# Patient Record
Sex: Female | Born: 1950 | Race: White | Hispanic: No | Marital: Married | Smoking: Former smoker
Health system: Southern US, Community
[De-identification: ages and names within clinical notes are randomized; demographics above are authoritative.]

## PROBLEM LIST (undated history)

## (undated) DIAGNOSIS — D649 Anemia, unspecified: Secondary | ICD-10-CM

## (undated) DIAGNOSIS — E039 Hypothyroidism, unspecified: Secondary | ICD-10-CM

## (undated) DIAGNOSIS — F32A Depression, unspecified: Secondary | ICD-10-CM

## (undated) DIAGNOSIS — M1990 Unspecified osteoarthritis, unspecified site: Secondary | ICD-10-CM

## (undated) DIAGNOSIS — R51 Headache: Secondary | ICD-10-CM

## (undated) DIAGNOSIS — R000 Tachycardia, unspecified: Secondary | ICD-10-CM

## (undated) DIAGNOSIS — E669 Obesity, unspecified: Secondary | ICD-10-CM

## (undated) DIAGNOSIS — I872 Venous insufficiency (chronic) (peripheral): Secondary | ICD-10-CM

## (undated) DIAGNOSIS — K219 Gastro-esophageal reflux disease without esophagitis: Secondary | ICD-10-CM

## (undated) DIAGNOSIS — F329 Major depressive disorder, single episode, unspecified: Secondary | ICD-10-CM

## (undated) DIAGNOSIS — I10 Essential (primary) hypertension: Secondary | ICD-10-CM

## (undated) DIAGNOSIS — E785 Hyperlipidemia, unspecified: Secondary | ICD-10-CM

## (undated) DIAGNOSIS — I480 Paroxysmal atrial fibrillation: Secondary | ICD-10-CM

## (undated) DIAGNOSIS — I5032 Chronic diastolic (congestive) heart failure: Secondary | ICD-10-CM

## (undated) DIAGNOSIS — I639 Cerebral infarction, unspecified: Secondary | ICD-10-CM

## (undated) DIAGNOSIS — T7840XA Allergy, unspecified, initial encounter: Secondary | ICD-10-CM

## (undated) DIAGNOSIS — I779 Disorder of arteries and arterioles, unspecified: Secondary | ICD-10-CM

## (undated) DIAGNOSIS — R112 Nausea with vomiting, unspecified: Secondary | ICD-10-CM

## (undated) DIAGNOSIS — I509 Heart failure, unspecified: Secondary | ICD-10-CM

## (undated) DIAGNOSIS — I209 Angina pectoris, unspecified: Secondary | ICD-10-CM

## (undated) DIAGNOSIS — G629 Polyneuropathy, unspecified: Secondary | ICD-10-CM

## (undated) DIAGNOSIS — Z87442 Personal history of urinary calculi: Secondary | ICD-10-CM

## (undated) DIAGNOSIS — Z9889 Other specified postprocedural states: Secondary | ICD-10-CM

## (undated) DIAGNOSIS — I2510 Atherosclerotic heart disease of native coronary artery without angina pectoris: Secondary | ICD-10-CM

## (undated) DIAGNOSIS — F419 Anxiety disorder, unspecified: Secondary | ICD-10-CM

## (undated) DIAGNOSIS — R0602 Shortness of breath: Secondary | ICD-10-CM

## (undated) DIAGNOSIS — N179 Acute kidney failure, unspecified: Secondary | ICD-10-CM

## (undated) DIAGNOSIS — R0600 Dyspnea, unspecified: Secondary | ICD-10-CM

## (undated) DIAGNOSIS — D689 Coagulation defect, unspecified: Secondary | ICD-10-CM

## (undated) DIAGNOSIS — Z9289 Personal history of other medical treatment: Secondary | ICD-10-CM

## (undated) DIAGNOSIS — E11319 Type 2 diabetes mellitus with unspecified diabetic retinopathy without macular edema: Secondary | ICD-10-CM

## (undated) DIAGNOSIS — N186 End stage renal disease: Secondary | ICD-10-CM

## (undated) DIAGNOSIS — I219 Acute myocardial infarction, unspecified: Secondary | ICD-10-CM

## (undated) DIAGNOSIS — I739 Peripheral vascular disease, unspecified: Secondary | ICD-10-CM

## (undated) DIAGNOSIS — R32 Unspecified urinary incontinence: Secondary | ICD-10-CM

## (undated) DIAGNOSIS — R55 Syncope and collapse: Secondary | ICD-10-CM

## (undated) DIAGNOSIS — R519 Headache, unspecified: Secondary | ICD-10-CM

## (undated) DIAGNOSIS — E119 Type 2 diabetes mellitus without complications: Secondary | ICD-10-CM

## (undated) DIAGNOSIS — R0902 Hypoxemia: Secondary | ICD-10-CM

## (undated) DIAGNOSIS — I4510 Unspecified right bundle-branch block: Secondary | ICD-10-CM

## (undated) HISTORY — PX: ABDOMINAL HYSTERECTOMY: SHX81

## (undated) HISTORY — DX: Hyperlipidemia, unspecified: E78.5

## (undated) HISTORY — DX: Unspecified urinary incontinence: R32

## (undated) HISTORY — DX: Peripheral vascular disease, unspecified: I73.9

## (undated) HISTORY — DX: Anemia, unspecified: D64.9

## (undated) HISTORY — DX: Allergy, unspecified, initial encounter: T78.40XA

## (undated) HISTORY — PX: EYE SURGERY: SHX253

## (undated) HISTORY — PX: COLONOSCOPY W/ POLYPECTOMY: SHX1380

## (undated) HISTORY — DX: Essential (primary) hypertension: I10

## (undated) HISTORY — PX: CORONARY ANGIOPLASTY WITH STENT PLACEMENT: SHX49

## (undated) HISTORY — DX: Hypoxemia: R09.02

## (undated) HISTORY — PX: CATARACT EXTRACTION, BILATERAL: SHX1313

## (undated) HISTORY — DX: Disorder of arteries and arterioles, unspecified: I77.9

## (undated) HISTORY — DX: Unspecified osteoarthritis, unspecified site: M19.90

## (undated) HISTORY — DX: Coagulation defect, unspecified: D68.9

## (undated) HISTORY — PX: TUBAL LIGATION: SHX77

## (undated) HISTORY — DX: Obesity, unspecified: E66.9

## (undated) HISTORY — DX: Hypothyroidism, unspecified: E03.9

## (undated) HISTORY — DX: Atherosclerotic heart disease of native coronary artery without angina pectoris: I25.10

## (undated) HISTORY — DX: Unspecified right bundle-branch block: I45.10

## (undated) HISTORY — DX: Tachycardia, unspecified: R00.0

---

## 1998-01-10 ENCOUNTER — Other Ambulatory Visit: Admission: RE | Admit: 1998-01-10 | Discharge: 1998-01-10 | Payer: Self-pay | Admitting: Obstetrics & Gynecology

## 1998-02-07 ENCOUNTER — Encounter: Payer: Self-pay | Admitting: Obstetrics & Gynecology

## 1998-02-12 ENCOUNTER — Inpatient Hospital Stay (HOSPITAL_COMMUNITY): Admission: RE | Admit: 1998-02-12 | Discharge: 1998-02-16 | Payer: Self-pay | Admitting: Obstetrics & Gynecology

## 1998-10-23 ENCOUNTER — Encounter: Payer: Self-pay | Admitting: Emergency Medicine

## 1998-10-24 ENCOUNTER — Inpatient Hospital Stay (HOSPITAL_COMMUNITY): Admission: EM | Admit: 1998-10-24 | Discharge: 1998-10-27 | Payer: Self-pay | Admitting: *Deleted

## 1999-08-05 ENCOUNTER — Encounter: Admission: RE | Admit: 1999-08-05 | Discharge: 1999-11-03 | Payer: Self-pay | Admitting: Family Medicine

## 1999-11-13 ENCOUNTER — Encounter: Admission: RE | Admit: 1999-11-13 | Discharge: 1999-11-13 | Payer: Self-pay | Admitting: Family Medicine

## 1999-11-13 ENCOUNTER — Encounter: Payer: Self-pay | Admitting: Family Medicine

## 2000-08-18 ENCOUNTER — Emergency Department (HOSPITAL_COMMUNITY): Admission: EM | Admit: 2000-08-18 | Discharge: 2000-08-18 | Payer: Self-pay | Admitting: Emergency Medicine

## 2000-08-18 ENCOUNTER — Encounter: Payer: Self-pay | Admitting: Emergency Medicine

## 2000-09-16 ENCOUNTER — Encounter: Admission: RE | Admit: 2000-09-16 | Discharge: 2000-09-16 | Payer: Self-pay | Admitting: Family Medicine

## 2000-09-16 ENCOUNTER — Encounter: Payer: Self-pay | Admitting: Family Medicine

## 2005-11-24 ENCOUNTER — Ambulatory Visit: Payer: Self-pay | Admitting: Internal Medicine

## 2005-11-24 ENCOUNTER — Inpatient Hospital Stay (HOSPITAL_COMMUNITY): Admission: EM | Admit: 2005-11-24 | Discharge: 2005-11-28 | Payer: Self-pay | Admitting: Emergency Medicine

## 2005-12-10 ENCOUNTER — Ambulatory Visit (HOSPITAL_COMMUNITY): Admission: RE | Admit: 2005-12-10 | Discharge: 2005-12-10 | Payer: Self-pay | Admitting: *Deleted

## 2005-12-10 ENCOUNTER — Ambulatory Visit: Payer: Self-pay | Admitting: *Deleted

## 2005-12-17 ENCOUNTER — Ambulatory Visit: Payer: Self-pay | Admitting: Cardiology

## 2005-12-17 ENCOUNTER — Encounter: Payer: Self-pay | Admitting: Cardiology

## 2005-12-17 ENCOUNTER — Ambulatory Visit: Payer: Self-pay

## 2005-12-31 ENCOUNTER — Ambulatory Visit: Payer: Self-pay | Admitting: Cardiology

## 2006-01-05 ENCOUNTER — Ambulatory Visit: Payer: Self-pay | Admitting: Cardiology

## 2006-01-05 ENCOUNTER — Inpatient Hospital Stay (HOSPITAL_BASED_OUTPATIENT_CLINIC_OR_DEPARTMENT_OTHER): Admission: RE | Admit: 2006-01-05 | Discharge: 2006-01-05 | Payer: Self-pay | Admitting: Cardiology

## 2006-03-19 ENCOUNTER — Ambulatory Visit: Payer: Self-pay | Admitting: Cardiology

## 2006-04-09 ENCOUNTER — Ambulatory Visit: Payer: Self-pay | Admitting: Cardiology

## 2006-04-15 ENCOUNTER — Ambulatory Visit: Payer: Self-pay | Admitting: Cardiology

## 2006-04-21 ENCOUNTER — Ambulatory Visit: Payer: Self-pay | Admitting: Cardiology

## 2006-05-15 ENCOUNTER — Ambulatory Visit: Payer: Self-pay | Admitting: Cardiology

## 2006-07-02 ENCOUNTER — Ambulatory Visit: Payer: Self-pay | Admitting: Cardiology

## 2006-07-23 ENCOUNTER — Encounter (HOSPITAL_COMMUNITY): Admission: RE | Admit: 2006-07-23 | Discharge: 2006-10-21 | Payer: Self-pay | Admitting: Cardiology

## 2006-09-14 ENCOUNTER — Ambulatory Visit: Payer: Self-pay | Admitting: Cardiology

## 2006-09-16 ENCOUNTER — Ambulatory Visit: Payer: Self-pay | Admitting: Cardiology

## 2006-10-01 ENCOUNTER — Ambulatory Visit: Payer: Self-pay | Admitting: Cardiology

## 2006-10-25 HISTORY — PX: KNEE ARTHROSCOPY: SHX127

## 2006-11-30 ENCOUNTER — Ambulatory Visit: Payer: Self-pay | Admitting: Cardiology

## 2007-01-25 LAB — HM PAP SMEAR

## 2007-05-31 ENCOUNTER — Ambulatory Visit: Payer: Self-pay | Admitting: Cardiology

## 2009-01-22 ENCOUNTER — Encounter: Payer: Self-pay | Admitting: Cardiology

## 2009-01-22 DIAGNOSIS — E663 Overweight: Secondary | ICD-10-CM | POA: Insufficient documentation

## 2009-01-22 DIAGNOSIS — I10 Essential (primary) hypertension: Secondary | ICD-10-CM | POA: Insufficient documentation

## 2009-01-22 DIAGNOSIS — R0602 Shortness of breath: Secondary | ICD-10-CM | POA: Insufficient documentation

## 2009-01-22 DIAGNOSIS — E119 Type 2 diabetes mellitus without complications: Secondary | ICD-10-CM | POA: Insufficient documentation

## 2009-01-22 DIAGNOSIS — E785 Hyperlipidemia, unspecified: Secondary | ICD-10-CM | POA: Insufficient documentation

## 2009-01-22 DIAGNOSIS — E8770 Fluid overload, unspecified: Secondary | ICD-10-CM | POA: Insufficient documentation

## 2009-01-24 ENCOUNTER — Ambulatory Visit: Payer: Self-pay | Admitting: Cardiology

## 2009-06-18 ENCOUNTER — Telehealth: Payer: Self-pay | Admitting: Cardiology

## 2009-06-28 ENCOUNTER — Encounter: Payer: Self-pay | Admitting: Cardiology

## 2009-07-12 ENCOUNTER — Encounter: Admission: RE | Admit: 2009-07-12 | Discharge: 2009-07-12 | Payer: Self-pay | Admitting: Obstetrics and Gynecology

## 2009-07-24 LAB — HM COLONOSCOPY

## 2010-01-29 ENCOUNTER — Ambulatory Visit: Payer: Self-pay | Admitting: Cardiology

## 2010-02-18 ENCOUNTER — Ambulatory Visit: Payer: Self-pay | Admitting: Cardiology

## 2010-04-01 ENCOUNTER — Ambulatory Visit: Payer: Self-pay | Admitting: Cardiology

## 2010-05-30 ENCOUNTER — Ambulatory Visit
Admission: RE | Admit: 2010-05-30 | Discharge: 2010-05-30 | Payer: Self-pay | Source: Home / Self Care | Attending: Cardiology | Admitting: Cardiology

## 2010-06-23 LAB — CONVERTED CEMR LAB
Basophils Absolute: 0 10*3/uL (ref 0.0–0.1)
Basophils Relative: 0.1 % (ref 0.0–1.0)
Eosinophils Absolute: 0.3 10*3/uL (ref 0.0–0.6)
Eosinophils Relative: 4 % (ref 0.0–5.0)
HCT: 33.8 % — ABNORMAL LOW (ref 36.0–46.0)
Hemoglobin: 11 g/dL — ABNORMAL LOW (ref 12.0–15.0)
Lymphocytes Relative: 15.4 % (ref 12.0–46.0)
MCHC: 32.7 g/dL (ref 30.0–36.0)
MCV: 68.7 fL — ABNORMAL LOW (ref 78.0–100.0)
Monocytes Absolute: 0.4 10*3/uL (ref 0.2–0.7)
Monocytes Relative: 5.9 % (ref 3.0–11.0)
Neutro Abs: 5.1 10*3/uL (ref 1.4–7.7)
Neutrophils Relative %: 74.6 % (ref 43.0–77.0)
Platelets: 351 10*3/uL (ref 150–400)
RBC: 4.92 M/uL (ref 3.87–5.11)
RDW: 15.8 % — ABNORMAL HIGH (ref 11.5–14.6)
WBC: 6.9 10*3/uL (ref 4.5–10.5)

## 2010-06-25 NOTE — Miscellaneous (Signed)
  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 MI in 2000.Marland KitchenMarland KitchenMi in 2007 treated bare metal stent HYPERTENSION Anemia LV ...good Osteoarthritis

## 2010-06-25 NOTE — Progress Notes (Signed)
Summary: REFILL -- lasix, plavix, benicar   Phone Note Refill Request Message from:  Patient on June 18, 2009 12:12 PM  Refills Requested: Medication #1:  FUROSEMIDE 40 MG TABS two tablets every morning. [facility] [address] [id number]  Initial call taken by: Delsa Sale,  June 18, 2009 12:13 PM    Prescriptions: PLAVIX 75 MG TABS (CLOPIDOGREL BISULFATE) Take one tablet by mouth daily  #30 x 8   Entered by:   Mignon Pine, RMA   Authorized by:   Lorenza Evangelist, MD, [facility]   Signed by:   Mignon Pine, RMA on 06/19/2009   Method used:   [facility]        [facility]  [address] [id number] ([facility]       [address]       [place], [place]  [address]       Ph: [phone]       Fax: [phone]   RxID:   [id number] BENICAR 40 MG TABS (OLMESARTAN MEDOXOMIL) Take one tablet by mouth daily  #30 x 8   Entered by:   Mignon Pine, RMA   Authorized by:   Lorenza Evangelist, MD, [facility]   Signed by:   Mignon Pine, RMA on 06/19/2009   Method used:   [facility]        [facility]  [address] [id number] ([facility]ail[facility]       [address]       [place], [place]  [address]       Ph: [phone]       Fax: [phone]   RxID:   [id number] FUROSEMIDE 40 MG TABS (FUROSEMIDE) two tablets every morning  #60 x 8   Entered by:   Mignon Pine, RMA   Authorized by:   Lorenza Evangelist, MD, [facility]   Signed by:   Mignon Pine, RMA on 06/19/2009   Method used:   [facility]        [facility]  [address] [id number] ([facility]       [address]       [place], [place]  [address]       Ph: [phone]       Fax: [phone]   [place]:   [phone]

## 2010-06-25 NOTE — Assessment & Plan Note (Signed)
Summary: f2w per check out on 9/6/lg  Medications Added AMITRIPTYLINE HCL 25 MG TABS (AMITRIPTYLINE HCL) at bedtime      Allergies Added:   Visit Type:  Follow-up Primary Provider:  [name], PA-C  CC:  shortness of breath.  History of Present Illness: Patient returns for followup of shortness of breath.  I saw her on January 29, 2010.  She was fluid overloaded.  She was drinking significant excess of fluid.  I have been able to get her to cut back.  She has lost 10 pounds since the last visit.  She is not convinced that it is all fluid but there is definitely a significant fluid component.  In addition I now realize that on certain days she does not take her diuretic if she is worried about excess urination when she is away from home.  I have advised her that she should take her Lasix when she gets home on those days.  Current Medications (verified): 1)  Carvedilol 25 Mg Tabs (Carvedilol) .... Take One Tablet By Mouth Twice A Day 2)  Isosorbide Dinitrate 10 Mg Tabs (Isosorbide Dinitrate) .... Take One Capsule By Mouth Two  Times A Day 3)  Plavix 75 Mg Tabs (Clopidogrel Bisulfate) .... Take One Tablet By Mouth Daily 4)  Benicar 40 Mg Tabs (Olmesartan Medoxomil) .... Take One Tablet By Mouth Daily 5)  Lyrica 100 Mg Caps (Pregabalin) .... Take One Capsule Three Times A Day 6)  Lipitor 80 Mg Tabs (Atorvastatin Calcium) .... Once Daily 7)  Humulin 70/30 70-30 % Susp (Insulin Isophane & Regular) .... As Directed 8)  Aspirin 81 Mg Tabs (Aspirin) .... Once Daily 9)  Furosemide 40 Mg Tabs (Furosemide) .... Two Tablets Every Morning 10)  Metformin Hcl 500 Mg Tabs (Metformin Hcl) .... Two Times A Day 11)  Amitriptyline Hcl 25 Mg Tabs (Amitriptyline Hcl) .... At Bedtime  Allergies (verified): 1)  ! Codeine  Past History:  Past Medical History:  SINUS TACHYCARDIA (ICD-427.89).Marland Kitchen FLUID OVERLOAD (ICD-276.6) OVERWEIGHT (ICD-278.02) DM (ICD-250.00) DYSLIPIDEMIA (ICD-272.4) SHORTNESS OF  BREATH (ICD-786.05) CAD (ICD-414.00)... MI  in 2000.  /   ..MI.... 2007 treated bare metal stent  ( no nuclear since then as  September, 2011) HYPERTENSION Anemia LV ...good...echo.... 2007 Osteoarthritis  Review of Systems       Patient denies fever, chills, headache, sweats, rash, change in vision, change in hearing, chest pain, cough, nausea vomiting, urinary symptoms.  All other systems are reviewed and are negative.  Vital Signs:  Patient profile:   60 year old female Height:      66 inches Weight:      258 pounds BMI:     41.79 Pulse rate:   65 / minute BP sitting:   118 / 76  (left arm) Cuff size:   large  Vitals Entered By: Mignon Pine, RMA (February 18, 2010 2:26 PM)  Physical Exam  General:  patient looks better today. Eyes:  no xanthelasma. Neck:  no jugular distention. Lungs:  lungs are clear.  Respiratory effort is nonlabored. Heart:  cardiac exam reveals S1-S2.  No clicks or significant murmurs or Abdomen:  abdomen is soft. Extremities:  there is 1+ peripheral edema today. Psych:  the patient is oriented to person time and place.  Affect is normal.   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 Take one tablet by mouth  twice a day    Isosorbide Dinitrate 10 Mg Tabs (Isosorbide dinitrate) .Marland Kitchen... Take one capsule by mouth two  times a day    Plavix 75 Mg Tabs (Clopidogrel bisulfate) .Marland Kitchen... Take one tablet by mouth daily    Aspirin 81 Mg Tabs (Aspirin) ..... Once daily Rate is controlled today.  No change in therapy.  Problem # 2:  FLUID OVERLOAD (ICD-276.6) We are making progress but there is still more diuresis necessary.  Patient will continue to keep her fluid intake decreased.  She will take an extra 80 mg of Lasix today and she gets home.  In addition she will longer miss days of not taking her diuretic.  If she goes out  in the morning she'll take the diuretic later in the day.  Problem # 3:  SHORTNESS OF BREATH (ICD-786.05)  Her updated medication list for this problem includes:    Carvedilol 25 Mg Tabs (Carvedilol) .Marland Kitchen... Take one tablet by mouth twice a day    Benicar 40 Mg Tabs (Olmesartan medoxomil) .Marland Kitchen... Take one tablet by mouth daily    Aspirin 81 Mg Tabs (Aspirin) ..... Once daily    Furosemide 40 Mg Tabs (Furosemide) .Marland Kitchen..Marland Kitchen Two tablets every morning Am hopeful that with continued diuresis her shortness of breath will improve.  If not we will look further into whether or not she is having ongoing ischemia.  Patient Instructions: 1)  Please make sure to take your furosemide every day 2)  Follow up in 6 weeks

## 2010-06-25 NOTE — Assessment & Plan Note (Signed)
Summary: per check out/sf  Medications Added ISOSORBIDE DINITRATE 10 MG TABS (ISOSORBIDE DINITRATE) Take one capsule by mouth two  times a day ON HOLD BENICAR 40 MG TABS (OLMESARTAN MEDOXOMIL) 1/2 TAB once daily      Allergies Added:   Visit Type:  Follow-up Primary Provider:  [name], PA-C  CC:  shortness of breath.  History of Present Illness: The patient is seen for cardiology followup.  I saw her last February 18, 2010.  At that time we talked about limiting her fluid intake we also gave her some extra Lasix.  Her weight is down 2 pounds.  Her breathing is improving.  Her blood pressure today is too low and we will make other adjustments  Current Medications (verified): 1)  Carvedilol 25 Mg Tabs (Carvedilol) .... Take One Tablet By Mouth Twice A Day 2)  Isosorbide Dinitrate 10 Mg Tabs (Isosorbide Dinitrate) .... Take One Capsule By Mouth Two  Times A Day 3)  Plavix 75 Mg Tabs (Clopidogrel Bisulfate) .... Take One Tablet By Mouth Daily 4)  Benicar 40 Mg Tabs (Olmesartan Medoxomil) .... Take One Tablet By Mouth Daily 5)  Lyrica 100 Mg Caps (Pregabalin) .... Take One Capsule Three Times A Day 6)  Lipitor 80 Mg Tabs (Atorvastatin Calcium) .... Once Daily 7)  Humulin 70/30 70-30 % Susp (Insulin Isophane & Regular) .... As Directed 8)  Aspirin 81 Mg Tabs (Aspirin) .... Once Daily 9)  Furosemide 40 Mg Tabs (Furosemide) .... Two Tablets Every Morning 10)  Metformin Hcl 500 Mg Tabs (Metformin Hcl) .... Two Times A Day 11)  Amitriptyline Hcl 25 Mg Tabs (Amitriptyline Hcl) .... At Bedtime  Allergies (verified): 1)  ! Codeine  Past History:  Past Medical History:  SINUS TACHYCARDIA (ICD-427.89).Marland Kitchen FLUID OVERLOAD (ICD-276.6) OVERWEIGHT (ICD-278.02) DM (ICD-250.00) DYSLIPIDEMIA (ICD-272.4) SHORTNESS OF BREATH (ICD-786.05) CAD (ICD-414.00)... MI  in 2000.  /   ..MI.... 2007 treated bare metal stent  ( no nuclear since then as  September, 2011) HYPERTENSION Anemia LV  ...good...echo.... 2007.. Osteoarthritis  Review of Systems       Patient denies fever, chills, headache, sweats, rash, change in vision, change in hearing, chest pain, cough, nausea vomiting, urinary symptoms.All of the systems are reviewed and are negative  Vital Signs:  Patient profile:   60 year old female Height:      66 inches Weight:      256 pounds BMI:     41.47 Pulse rate:   65 / minute BP sitting:   88 / 52  (right arm) Cuff size:   large  Vitals Entered By: Mignon Pine, RMA (April 01, 2010 2:20 PM)  Physical Exam  General:  patient is stable today.  She is overweight. Eyes:  no xanthelasma. Neck:  no jugular venous attention. Lungs:  lungs are clear.  Respiratory effort is nonlabored. Heart:  cardiac exam reveals S1 and S2.  No clicks or significant murmurs. Abdomen:  abdomen soft. Extremities:  no peripheral edema today. Psych:  patient is oriented to person time and place.  Affect is normal.   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 Take one tablet by mouth twice a day    Isosorbide Dinitrate 10 Mg Tabs (Isosorbide dinitrate) .Marland Kitchen... Take one capsule by mouth two  times a day on hold    Plavix 75 Mg Tabs (Clopidogrel bisulfate) .Marland Kitchen... Take  one tablet by mouth daily    Aspirin 81 Mg Tabs (Aspirin) ..... Once daily heart rate is under reasonable control today.  No change in therapy.  Problem # 2:  FLUID OVERLOAD (ICD-276.6) Volume status now appears to be stable.  Her meds will be continued.  Her blood pressure however is too low.  Her nitrate will be held and Benicar dose reduced.  Problem # 3:  SHORTNESS OF BREATH (ICD-786.05)  Her updated medication list for this problem includes:    Carvedilol 25 Mg Tabs (Carvedilol) .Marland Kitchen... Take one tablet by mouth twice a day    Benicar 40 Mg Tabs (Olmesartan medoxomil) .Marland Kitchen... 1/2  tab once daily    Aspirin 81 Mg Tabs (Aspirin) ..... Once daily    Furosemide 40 Mg Tabs (Furosemide) .Marland Kitchen..Marland Kitchen Two tablets every morning Shortness of breath is somewhat improved.  Her volume appears to be the key issue.  She also needs to lose weight.  Patient Instructions: 1)  Your physician recommends that you schedule a follow-up appointment in: 8 weeks 2)  Your physician has recommended you make the following change in your medication: Decrease Benicar to 1/2 tablet every day, and hold Imdur

## 2010-06-25 NOTE — Letter (Signed)
Summary: [facility] Note  [facility] Note   Imported By: Sallee Provencal 07/11/2009 14:04:36  _____________________________________________________________________  External Attachment:    Type:   Image     Comment:   External Document

## 2010-06-25 NOTE — Assessment & Plan Note (Signed)
Summary: f1y  Medications Added HUMULIN 70/30 70-30 % SUSP (INSULIN ISOPHANE & REGULAR) as directed METFORMIN HCL 500 MG TABS (METFORMIN HCL) two times a day TRAMADOL HCL 50 MG TABS (TRAMADOL HCL) at bedtime as needed      Allergies Added:   Visit Type:  Follow-up Primary Provider:  [name], PA-C  CC:  CAD.  History of Present Illness: The patient is seen for followup of coronary artery disease.  He also has a history of persistent mild sinus tachycardia.  Overall she's done well.  She does have shortness of breath.  This is chronic.  There's been no chest pain.  She does have edema.  She says that she limits her salt intake.  However she has a dry mouth and she drinks a large amount of extra fluids.  Current Medications (verified): 1)  Carvedilol 25 Mg Tabs (Carvedilol) .... Take One Tablet By Mouth Twice A Day 2)  Isosorbide Dinitrate 10 Mg Tabs (Isosorbide Dinitrate) .... Take One Capsule By Mouth Two  Times A Day 3)  Plavix 75 Mg Tabs (Clopidogrel Bisulfate) .... Take One Tablet By Mouth Daily 4)  Benicar 40 Mg Tabs (Olmesartan Medoxomil) .... Take One Tablet By Mouth Daily 5)  Lyrica 100 Mg Caps (Pregabalin) .... Take One Capsule Three Times A Day 6)  Lipitor 80 Mg Tabs (Atorvastatin Calcium) .... Once Daily 7)  Humulin 70/30 70-30 % Susp (Insulin Isophane & Regular) .... As Directed 8)  Aspirin 81 Mg Tabs (Aspirin) .... Once Daily 9)  Furosemide 40 Mg Tabs (Furosemide) .... Two Tablets Every Morning 10)  Metformin Hcl 500 Mg Tabs (Metformin Hcl) .... Two Times A Day 11)  Tramadol Hcl 50 Mg Tabs (Tramadol Hcl) .... At Bedtime As Needed  Allergies (verified): 1)  ! Codeine  Past History:  Past Medical History:  SINUS TACHYCARDIA (ICD-427.89).Marland Kitchen FLUID OVERLOAD (ICD-276.6) OVERWEIGHT (ICD-278.02) DM (ICD-250.00) DYSLIPIDEMIA (ICD-272.4) SHORTNESS OF BREATH (ICD-786.05) CAD (ICD-414.00)... MI  in 2000.  /   ..MI.... 2007 treated bare metal stent  ( no nuclear since  then as  September, 2011) HYPERTENSION Anemia LV ...good...echo.... 2007 Osteoarthritis  Review of Systems       Patient denies fever, chills, headache, sweats, rash, change in vision, change in hearing, chest pain, cough, nausea vomiting, urinary symptoms.  All other systems are reviewed and are negative.  Vital Signs:  Patient profile:   60 year old female Height:      66 inches Weight:      268 pounds BMI:     43.41 Pulse rate:   90 / minute BP sitting:   132 / 84  (left arm) Cuff size:   large  Vitals Entered By: Mignon Pine, RMA (January 29, 2010 3:00 PM)  Physical Exam  General:  patient feels well today but had some shortness of breath. Head:  head is atraumatic. Eyes:    No xanthelasma. Neck:  no jugular venous extension. Chest Wall:  no chest wall tenderness. Lungs:  lungs are clear.  Respiratory effort is nonlabored. Heart:  cardiac exam reveals an S1 and S2.  No clicks or significant murmurs. Abdomen:  abdomen is soft. Msk:  no musculoskeletal deformities. Extremities:  1-2+ peripheral edema. Skin:  chronic venous changes in the lower extremities. Psych:  patient is oriented to person time and place.  Affect is normal.   Impression & Recommendations:  Problem # 1:  RBBB (ICD-426.4)  Her updated medication list for this problem includes:    Carvedilol  25 Mg Tabs (Carvedilol) .Marland Kitchen... Take one tablet by mouth twice a day    Isosorbide Dinitrate 10 Mg Tabs (Isosorbide dinitrate) .Marland Kitchen... Take one capsule by mouth two  times a day    Plavix 75 Mg Tabs (Clopidogrel bisulfate) .Marland Kitchen... Take one tablet by mouth daily    Aspirin 81 Mg Tabs (Aspirin) ..... Once daily  Orders: EKG w/ Interpretation (93000) EKG is done today and reviewed by me.  There is old right bundle branch block with no change.  Problem # 2:  SINUS TACHYCARDIA (ICD-427.89)  Her updated medication list for this problem includes:    Carvedilol 25 Mg Tabs (Carvedilol) .Marland Kitchen... Take one tablet by  mouth twice a day    Isosorbide Dinitrate 10 Mg Tabs (Isosorbide dinitrate) .Marland Kitchen... Take one capsule by mouth two  times a day    Plavix 75 Mg Tabs (Clopidogrel bisulfate) .Marland Kitchen... Take one tablet by mouth daily    Aspirin 81 Mg Tabs (Aspirin) ..... Once daily Heart rate today is 90.  This is okay for her.  Problem # 3:  FLUID OVERLOAD (ICD-276.6) The patient is fluid overloaded.  She says she is careful with her salt intake.  I do believe that her fluid intake is excessive.  She will take an extra dose of 80 mg of Lasix when she gets home today.  She'll then remain on 80 mg daily.  She will do her best to cut back her fluids somewhat understanding that she may feel thirst.  I suggested sugar-free candies to see if this helps with the first sensation.  Problem # 4:  OVERWEIGHT (ICD-278.02) Weight loss of course would be helpful.  Problem # 5:  SHORTNESS OF BREATH (ICD-786.05)  Her updated medication list for this problem includes:    Carvedilol 25 Mg Tabs (Carvedilol) .Marland Kitchen... Take one tablet by mouth twice a day    Benicar 40 Mg Tabs (Olmesartan medoxomil) .Marland Kitchen... Take one tablet by mouth daily    Aspirin 81 Mg Tabs (Aspirin) ..... Once daily    Furosemide 40 Mg Tabs (Furosemide) .Marland Kitchen..Marland Kitchen Two tablets every morning I believe her shortness of breath his volume.  However we have to consider ischemia.  Problem # 6:  CAD (ICD-414.00)  Her updated medication list for this problem includes:    Carvedilol 25 Mg Tabs (Carvedilol) .Marland Kitchen... Take one tablet by mouth twice a day    Isosorbide Dinitrate 10 Mg Tabs (Isosorbide dinitrate) .Marland Kitchen... Take one capsule by mouth two  times a day    Plavix 75 Mg Tabs (Clopidogrel bisulfate) .Marland Kitchen... Take one tablet by mouth daily    Aspirin 81 Mg Tabs (Aspirin) ..... Once daily Coronary disease is stable.  Her intervention was done in 2007.  She has not had an exercise stress test.  After I see how she responds to trying to reduce her volume load we will decide about possible  exercise testing.  Problem # 7:  HYPERTENSION (ICD-401.9)  Her updated medication list for this problem includes:    Carvedilol 25 Mg Tabs (Carvedilol) .Marland Kitchen... Take one tablet by mouth twice a day    Benicar 40 Mg Tabs (Olmesartan medoxomil) .Marland Kitchen... Take one tablet by mouth daily    Aspirin 81 Mg Tabs (Aspirin) ..... Once daily    Furosemide 40 Mg Tabs (Furosemide) .Marland Kitchen..Marland Kitchen Two tablets every morning Blood pressure is controlled today.  Patient Instructions: 1)  Your physician has requested that you limit your fluid intake. 2)  Follow up in 2 weeks

## 2010-06-25 NOTE — Assessment & Plan Note (Signed)
Summary: ROV  Medications Added CARVEDILOL 25 MG TABS (CARVEDILOL) Take one tablet by mouth twice a day LYRICA 100 MG CAPS (PREGABALIN) take one capsule three times a day LIPITOR 80 MG TABS (ATORVASTATIN CALCIUM) once daily NOVOLOG 100 UNIT/ML SOLN (INSULIN ASPART) use 25 units before each meal NOVOLIN N 100 UNIT/ML SUSP (INSULIN ISOPHANE HUMAN) 75 units two times a day ASPIRIN 81 MG TABS (ASPIRIN) once daily FUROSEMIDE 40 MG TABS (FUROSEMIDE) two tablets every morning      Allergies Added: ! CODEINE  Visit Type:  Follow-up Primary Provider:  [name], PA-C  CC:  CAD.  History of Present Illness: The patient is seen for follow up of coronary disease and sinus tachycardia.  I saw her last in January, 2009.  She is actually doing well.  She had persistent sinus tachycardia.  With beta blocker she has done well.  She is not having any recurrent chest pain.  No significant shortness of breath.  She does have problems with volume overload.  She takes her Lasix daily and this is stable.  She also has diabetes.  She mentions today that she's had some tingling in her feet.  I explained to her that most likely this is related to her diabetes.  Current Medications (verified): 1)  Carvedilol 12.5 Mg Tabs (Carvedilol) .... Take 2 Tablets By Mouth Twice A Day 2)  Isosorbide Dinitrate 10 Mg Tabs (Isosorbide Dinitrate) .... Take One Capsule By Mouth Two  Times A Day 3)  Plavix 75 Mg Tabs (Clopidogrel Bisulfate) .... Take One Tablet By Mouth Daily 4)  Benicar 40 Mg Tabs (Olmesartan Medoxomil) .... Take One Tablet By Mouth Daily 5)  Lyrica 100 Mg Caps (Pregabalin) .... Take One Capsule Three Times A Day 6)  Lipitor 80 Mg Tabs (Atorvastatin Calcium) .... Once Daily 7)  Novolog 100 Unit/ml Soln (Insulin Aspart) .... Use 25 Units Before Each Meal 8)  Novolin N 100 Unit/ml Susp (Insulin Isophane Human) .... 75 Units Two Times A Day 9)  Aspirin 81 Mg Tabs (Aspirin) .... Once Daily 10)  Furosemide 40  Mg Tabs (Furosemide) .... Two Tablets Every Morning  Allergies (verified): 1)  ! Codeine  Past History:  Past Medical History: Reviewed history from 01/22/2009 and no changes required.  SINUS TACHYCARDIA (ICD-427.89).Marland Kitchen FLUID OVERLOAD (ICD-276.6) OVERWEIGHT (ICD-278.02) DM (ICD-250.00) DYSLIPIDEMIA (ICD-272.4) SHORTNESS OF BREATH (ICD-786.05) CAD (ICD-414.00)... MI in 2000.Marland KitchenMarland KitchenMi in 2007 treated bare metal stent HYPERTENSION Anemia LV ...good Osteoarthritis  Review of Systems       The patient denies fever, chills, headache, skin rash, sweats.  She does complain of some mild areas of ecchymoses and I explained this is related to aspirin and Plavix.  She denies change in vision, change in hearing, chest pain, cough, shortness of breath, GI symptoms, GU symptoms, musculoskeletal problems.  All other systems are reviewed and are negative.  Vital Signs:  Patient profile:   60 year old female Height:      66 inches Weight:      261 pounds BMI:     42.28 Pulse rate:   84 / minute Pulse rhythm:   regular BP sitting:   110 / 60  (left arm)  Vitals Entered By: Margaretmary Bayley CMA (January 24, 2009 10:52 AM)  Physical Exam  General:  patient is overweight but stable today. Head:  head is normocephalic. Eyes:  there is no xanthelasma. Neck:  there is no jugular venous distention. Chest Wall:  no chest wall tenderness. Lungs:  lungs  are clear.  Respiratory effort is nonlabored. Heart:  cardiac exam reveals S1-S2.  No clicks or significant murmurs. Abdomen:  abdomen is soft. Msk:  there are no musculoskeletal deformities. Extremities:  patient has trace peripheral edema. Neurologic:  neurologic is grossly intact. Skin:  patient does have a few areas of ecchymoses. Psych:  patient is oriented to person time and place.  Affect is normal.   Impression & Recommendations:  Problem # 1:  SINUS TACHYCARDIA (ICD-427.89) Historically the patient has had persistent sinus tachycardia.   On the beta blocker she is stable.  She has been taking 12.5 mg (2) twice a day for a total of 25 mg twice a day.  We will change her to a 25 mg tab b.i.d.  Problem # 2:  FLUID OVERLOAD (ICD-276.6) Fluid status is stable.  She does have trace edema.  I do not think it would be wise to increase her diuretic dose every  Problem # 3:  CAD (ICD-414.00) Coronary disease is stable.  EKG is done today and reviewed by me.  She has sinus rhythm.  There is old right bundle branch block.  There is no significant change.  Problem # 4:  * ??? PERIPHERAL NEUROPATHY ??? There is question patient may have some peripheral neuropathy. I have encouraged her to speak with her primary care team about this.  Patient Instructions: 1)  Your physician wants you to follow-up in:  1 YEAR.  You will receive a reminder letter in the mail two months in advance. If you don't receive a letter, please call our office to schedule the follow-up appointment. 2)  Carvedilol prescription has been changed to 25mg  tablet twice a day Prescriptions: CARVEDILOL 25 MG TABS (CARVEDILOL) Take one tablet by mouth twice a day  #60 x 12   Entered by:   Theodosia Quay, RN, BSN   Authorized by:   Lorenza Evangelist, MD, [facility]   Signed by:   Theodosia Quay, RN, BSN on 01/24/2009   Method used:   Electronically to        [id number]       [address]       [place], [place]  [address]       Ph: [phone]       Fax: [phone]   RxID:   [id number]

## 2010-06-27 NOTE — Assessment & Plan Note (Signed)
Summary: per check out/sf  Medications Added AVELOX 400 MG TABS (MOXIFLOXACIN HCL) once daily for 7 days MEDROL (PAK) 4 MG TABS (METHYLPREDNISOLONE) take as directed      Allergies Added:   Visit Type:  Follow-up Primary Provider:  [name], PA-C  CC:  shortness of breath.  History of Present Illness: The patient is seen for followup of shortness of breath.  I saw her last April 01, 2010.  Her volume status appeared improved.  Her blood pressure was low.  I put her nitrate on hold and cut her Benicar to smaller dose.  Today her blood pressure is stable.  She is having shortness of breath.  However this appears to be some type of ongoing respiratory issue.  She is having persistent sinus symptoms with drainage and a persistent cough.   Current Medications (verified): 1)  Carvedilol 25 Mg Tabs (Carvedilol) .... Take One Tablet By Mouth Twice A Day 2)  Isosorbide Dinitrate 10 Mg Tabs (Isosorbide Dinitrate) .... Take One Capsule By Mouth Two  Times A Day On Hold 3)  Plavix 75 Mg Tabs (Clopidogrel Bisulfate) .... Take One Tablet By Mouth Daily 4)  Benicar 40 Mg Tabs (Olmesartan Medoxomil) .... 1/2 Tab Once Daily 5)  Lyrica 100 Mg Caps (Pregabalin) .... Take One Capsule Three Times A Day 6)  Lipitor 80 Mg Tabs (Atorvastatin Calcium) .... Once Daily 7)  Humulin 70/30 70-30 % Susp (Insulin Isophane & Regular) .... As Directed 8)  Aspirin 81 Mg Tabs (Aspirin) .... Once Daily 9)  Furosemide 40 Mg Tabs (Furosemide) .... Two Tablets Every Morning 10)  Metformin Hcl 500 Mg Tabs (Metformin Hcl) .... Two Times A Day 11)  Amitriptyline Hcl 25 Mg Tabs (Amitriptyline Hcl) .... At Bedtime  Allergies (verified): 1)  ! Codeine  Past History:  Past Medical History:  SINUS TACHYCARDIA (ICD-427.89).Marland Kitchen FLUID OVERLOAD (ICD-276.6) OVERWEIGHT (ICD-278.02) DM (ICD-250.00) DYSLIPIDEMIA (ICD-272.4) SHORTNESS OF BREATH (ICD-786.05) CAD (ICD-414.00)... MI  in 2000.  /   ..MI.... 2007 treated bare  metal stent  ( no nuclear since then as  September, 2011) HYPERTENSION Anemia LV ...good...echo.... 2007.. Osteoarthritis sinusitis and bronchitis   January, 2012  Review of Systems       Patient denies fever, chills, headache, sweats, rash, change in vision, change in hearing, chest pain, nausea vomiting, urinary symptoms.  All other systems are reviewed and are negative.  Vital Signs:  Patient profile:   60 year old female Height:      66 inches Weight:      257 pounds BMI:     41.63 Pulse rate:   85 / minute BP sitting:   108 / 68  (left arm) Cuff size:   regular  Vitals Entered By: Mignon Pine, RMA (May 30, 2010 3:52 PM)  Physical Exam  General:  patient is mildly uncomfortable with her symptoms. Head:  head is atraumatic.  There is mild swelling from her sinuses. Eyes:  no xanthelasma. Neck:  no jugular venous distention. Chest Wall:  no chest wall tenderness. Lungs:  lungs are clear.  Respiratory effort is nonlabored. Heart:  cardiac exam reveals S1-S2.  No clicks or significant murmurs. Abdomen:  abdomen is soft. Msk:  no musculoskeletal deformities. Extremities:  no peripheral edema. Skin:  no skin rashes. Psych:  patient is oriented to person time and place.  Affect is normal.   Impression & Recommendations:  Problem # 1:  SINUS TACHYCARDIA (ICD-427.89)  Her updated medication list for this problem includes:  Carvedilol 25 Mg Tabs (Carvedilol) .Marland Kitchen... Take one tablet by mouth twice a day    Isosorbide Dinitrate 10 Mg Tabs (Isosorbide dinitrate) .Marland Kitchen... Take one capsule by mouth two  times a day on hold    Plavix 75 Mg Tabs (Clopidogrel bisulfate) .Marland Kitchen... Take one tablet by mouth daily    Aspirin 81 Mg Tabs (Aspirin) ..... Once daily Heart rate is 85 today.  This is a good rate for her.  No change in therapy.  Problem # 2:  FLUID OVERLOAD (ICD-276.6) Her fluid status is now well controlled.  She's been watching her fluid intake and taking her diuretic.   No change in therapy.  Problem # 3:  SHORTNESS OF BREATH (ICD-786.05) I believe her current shortness of breath is not from fluid overload.  I believe she is having some asthmatic bronchitis along with sinusitis.  She will be treated with Avelox for 7 days along with a Medrol Dosepak.  Problem # 4:  HYPERTENSION (ICD-401.9)  Her updated medication list for this problem includes:    Carvedilol 25 Mg Tabs (Carvedilol) .Marland Kitchen... Take one tablet by mouth twice a day    Benicar 40 Mg Tabs (Olmesartan medoxomil) .Marland Kitchen... 1/2 tab once daily    Aspirin 81 Mg Tabs (Aspirin) ..... Once daily    Furosemide 40 Mg Tabs (Furosemide) .Marland Kitchen..Marland Kitchen Two tablets every morning The patient's blood pressure had been too low at the time of her last visit.  It is stable now.  Current medications will be continued.  Patient Instructions: 1)  We have sent in a prescription for Avelox 400mg  once daily for 7 days and a Medrol dose pack 2)  Follow up in 3 months. Prescriptions: MEDROL (PAK) 4 MG TABS (METHYLPREDNISOLONE) take as directed  #21 x 0   Entered by:   Kevan Rosebush, RN   Authorized by:   Lorenza Evangelist, MD, [facility]   Signed by:   Kevan Rosebush, RN on 05/30/2010   Method used:   [facility] to        [id number]       [address]       [place], [place]  [address]       Ph: [phone]       Fax: [phone]   RxID:   [id number] AVELOX 400 MG TABS (MOXIFLOXACIN HCL) once daily for 7 days  #7 x 0   Entered by:   Kevan Rosebush, RN   Authorized by:   Lorenza Evangelist, MD, [facility]   Signed by:   Kevan Rosebush, RN on 05/30/2010   Method used:   [facility]ically to        [id number]       [address]       [place], [place]  [address]       Ph: [phone]       Fax: [phone]   Theresa:   [phone]

## 2010-07-31 ENCOUNTER — Encounter (INDEPENDENT_AMBULATORY_CARE_PROVIDER_SITE_OTHER): Payer: Self-pay | Admitting: *Deleted

## 2010-08-06 NOTE — Letter (Signed)
Summary: Appointment - [place] [place]  [address] [place]   [place], [place] [address]   Phone: [phone]  Fax: [phone]     July 31, 2010 MRN: [id number]   [facility] [place], [place]  [address]   Dear Ms. Wahler,   Due to a change in our office schedule, your appointment on Apirl 11,2012 at 2:30 must be changed.  It is very important that we reach you to reschedule this appointment. We look forward to participating in your health care needs. Please contact us at the number listed above at your earliest convenience to reschedule this appointment.     Sincerely, Designer, fashion/clothing

## 2010-09-04 ENCOUNTER — Ambulatory Visit: Payer: BC Managed Care – PPO | Admitting: Cardiology

## 2010-09-06 ENCOUNTER — Encounter: Payer: Self-pay | Admitting: Cardiology

## 2010-09-09 ENCOUNTER — Encounter: Payer: Self-pay | Admitting: Cardiology

## 2010-09-09 DIAGNOSIS — I2510 Atherosclerotic heart disease of native coronary artery without angina pectoris: Secondary | ICD-10-CM | POA: Insufficient documentation

## 2010-09-09 DIAGNOSIS — R000 Tachycardia, unspecified: Secondary | ICD-10-CM | POA: Insufficient documentation

## 2010-09-10 ENCOUNTER — Ambulatory Visit (INDEPENDENT_AMBULATORY_CARE_PROVIDER_SITE_OTHER): Payer: BLUE CROSS/BLUE SHIELD | Admitting: Cardiology

## 2010-09-10 ENCOUNTER — Encounter: Payer: Self-pay | Admitting: Cardiology

## 2010-09-10 DIAGNOSIS — I2510 Atherosclerotic heart disease of native coronary artery without angina pectoris: Secondary | ICD-10-CM

## 2010-09-10 DIAGNOSIS — R0602 Shortness of breath: Secondary | ICD-10-CM

## 2010-09-10 DIAGNOSIS — E8779 Other fluid overload: Secondary | ICD-10-CM

## 2010-09-10 DIAGNOSIS — I4510 Unspecified right bundle-branch block: Secondary | ICD-10-CM | POA: Insufficient documentation

## 2010-09-10 DIAGNOSIS — R000 Tachycardia, unspecified: Secondary | ICD-10-CM

## 2010-09-10 MED ORDER — METOLAZONE 2.5 MG PO TABS: ORAL_TABLET | ORAL | Status: DC

## 2010-09-10 NOTE — Patient Instructions (Signed)
Start Zaroxolyn 2.5mg  1 tablet 4 days a week Your physician has requested that you have an echocardiogram. Echocardiography is a painless test that uses sound waves to create images of your heart. It provides your doctor with information about the size and shape of your heart and how well your heart's chambers and valves are working. This procedure takes approximately one hour. There are no restrictions for this procedure. Your physician recommends that you schedule a follow-up appointment in: 2-3 weeks

## 2010-09-10 NOTE — Progress Notes (Signed)
HPI The patient is seen for followup sinus tachycardia, shortness of breath, peripheral edema, coronary artery disease.  I saw her last May 30, 2010.  Since that time she said she has had some mild indigestion.  She started to take her Isordil again and thinks it may have helped.  She has persistent edema.  She says that she is limiting her salt and fluid intake.  She is taking 80 Lasix each morning.  She is trying to remain active.  Allergies  Allergen Reactions  . Codeine     Current Outpatient Prescriptions  Medication Sig Dispense Refill  . amitriptyline (ELAVIL) 25 MG tablet Take 25 mg by mouth at bedtime.        Marland Kitchen aspirin 81 MG tablet Take 81 mg by mouth daily.        Marland Kitchen atorvastatin (LIPITOR) 80 MG tablet Take 80 mg by mouth daily.        . carvedilol (COREG) 25 MG tablet Take 25 mg by mouth 2 (two) times daily with a meal.        . clopidogrel (PLAVIX) 75 MG tablet Take 75 mg by mouth daily.        . furosemide (LASIX) 40 MG tablet Take 80 mg by mouth daily.        . insulin NPH-insulin regular (HUMULIN 70/30) (70-30) 100 UNIT/ML injection as directed.        . isosorbide dinitrate (ISORDIL) 10 MG tablet Take 10 mg by mouth 2 (two) times daily.        . metFORMIN (GLUCOPHAGE) 500 MG tablet Take 500 mg by mouth 2 (two) times daily with a meal.        . olmesartan (BENICAR) 40 MG tablet Take 20 mg by mouth daily.        . pregabalin (LYRICA) 100 MG capsule Take 100 mg by mouth 3 (three) times daily.        . methylPREDNISolone (MEDROL) 4 MG tablet Take 4 mg by mouth as directed.          History   Social History  . Marital Status: Married    Spouse Name: N/A    Number of Children: N/A  . Years of Education: N/A   Occupational History  . Not on file.   Social History Main Topics  . Smoking status: Former Smoker    Quit date: 05/26/2000  . Smokeless tobacco: Not on file  . Alcohol Use: No  . Drug Use: Not on file  . Sexually Active: Not on file   Other Topics Concern    . Not on file   Social History Narrative  . No narrative on file    Family History  Problem Relation Age of Onset  . Coronary artery disease      Past Medical History  Diagnosis Date  . Tachycardia     Sinus tachycardia  . Fluid overload   . Overweight   . DM (diabetes mellitus)   . Dyslipidemia   . SOB (shortness of breath)   . CAD (coronary artery disease)     MI in 2000 - MI  2007 - treated bare metal stent (no nuclear since then as 9/11)  . HTN (hypertension)   . Anemia   . Osteoarthritis   . Sinusitis 1/12  . Ejection fraction     good,echo,2007    No past surgical history on file.  ROS  Patient denies fever, chills, headache, sweats, rash, change in vision, change in hearing,  cough, nausea vomiting, urinary symptoms.  All other systems are reviewed and are negative. PHYSICAL EXAM Patient is overweight.  She is gained from 257-272 pounds since her last visit.  I do believe some of this is fluid weight.  She is oriented to person time and place.  Affect is normal.  Head is atraumatic.  There is no xanthelasma.  There is no jugular venous distention.  Lungs are clear.  Respiratory effort is not labored.  Cardiac exam reveals S1 and S2.  There are no clicks or significant murmurs.  The abdomen is soft.  There is 1+ peripheral edema.  There are no musculoskeletal deformities.  There is no skin rashes.  Filed Vitals:   09/10/10 1400  BP: 135/82  Pulse: 90  Resp: 20  Height: 5\' 6"  (1.676 m)  Weight: 272 lb 6.4 oz (123.56 kg)    EKG  EKG is done today and reviewed by me.  The rate is 90.  There is right bundle branch block. This bundle-branch block is old. ASSESSMENT & PLAN

## 2010-09-10 NOTE — Assessment & Plan Note (Signed)
Patient has peripheral edema.  Zaroxolyn will be added to her Lasix.  Renal function will be followed carefully.  I will then see her for followup.

## 2010-09-10 NOTE — Assessment & Plan Note (Signed)
Patient has had some indigestion.  It is possible that she is having some ischemia.  I will first obtain a 2-D echo to reassess LV function and try to diurese her further.  I let see her back and decide if we will proceed with exercise testing.

## 2010-09-10 NOTE — Assessment & Plan Note (Signed)
Patient continues to have shortness of breath.  She is volume overloaded.  We will diuresis her and proceed with 2-D echo.

## 2010-09-10 NOTE — Assessment & Plan Note (Signed)
Patient remains on beta blocker.  Her heart is mildly elevated at rest today.  No change in therapy.

## 2010-09-19 ENCOUNTER — Ambulatory Visit (HOSPITAL_COMMUNITY): Payer: BC Managed Care – PPO | Attending: Physician Assistant | Admitting: Radiology

## 2010-09-19 DIAGNOSIS — R0989 Other specified symptoms and signs involving the circulatory and respiratory systems: Secondary | ICD-10-CM | POA: Insufficient documentation

## 2010-09-19 DIAGNOSIS — I2510 Atherosclerotic heart disease of native coronary artery without angina pectoris: Secondary | ICD-10-CM | POA: Insufficient documentation

## 2010-09-19 DIAGNOSIS — R0602 Shortness of breath: Secondary | ICD-10-CM

## 2010-09-19 DIAGNOSIS — R0609 Other forms of dyspnea: Secondary | ICD-10-CM | POA: Insufficient documentation

## 2010-09-19 DIAGNOSIS — I4510 Unspecified right bundle-branch block: Secondary | ICD-10-CM | POA: Insufficient documentation

## 2010-09-22 ENCOUNTER — Other Ambulatory Visit: Payer: Self-pay | Admitting: Cardiology

## 2010-09-23 ENCOUNTER — Encounter: Payer: Self-pay | Admitting: Cardiology

## 2010-09-23 NOTE — Progress Notes (Signed)
Pt aware of echo results and to keep appt as scheduled

## 2010-09-24 ENCOUNTER — Ambulatory Visit (INDEPENDENT_AMBULATORY_CARE_PROVIDER_SITE_OTHER): Payer: BC Managed Care – PPO | Admitting: Cardiology

## 2010-09-24 ENCOUNTER — Encounter: Payer: Self-pay | Admitting: Cardiology

## 2010-09-24 DIAGNOSIS — R0602 Shortness of breath: Secondary | ICD-10-CM

## 2010-09-24 DIAGNOSIS — E8779 Other fluid overload: Secondary | ICD-10-CM

## 2010-09-24 LAB — BASIC METABOLIC PANEL
BUN: 33 mg/dL — ABNORMAL HIGH (ref 6–23)
CO2: 26 mEq/L (ref 19–32)
Calcium: 9.6 mg/dL (ref 8.4–10.5)
Chloride: 104 mEq/L (ref 96–112)
Creatinine, Ser: 1.1 mg/dL (ref 0.4–1.2)
GFR: 52.51 mL/min — ABNORMAL LOW (ref >60.00)
Glucose, Bld: 136 mg/dL — ABNORMAL HIGH (ref 70–99)
Potassium: 5.4 mEq/L — ABNORMAL HIGH (ref 3.5–5.1)
Sodium: 139 mEq/L (ref 135–145)

## 2010-09-24 MED ORDER — METOLAZONE 2.5 MG PO TABS: ORAL_TABLET | ORAL | Status: DC

## 2010-09-24 NOTE — Assessment & Plan Note (Signed)
Her shortness of breath is somewhat improved with diuresis.  It is difficult to assess fully as she has some nasal congestion for other reasons.

## 2010-09-24 NOTE — Patient Instructions (Signed)
Lab today (bmet) Decrease Zaroxolyn to every Mon, Wed and Fri Your physician recommends that you schedule a follow-up appointment in: 4 weeks

## 2010-09-24 NOTE — Assessment & Plan Note (Signed)
She is responding to [employer].  I discussed again with her being careful with her salt and fluid intake.  I reminded her to try her best to deal with feeling thirsty.  Chemistry will be checked today to be sure that she is stable with the diuresis.  We will use Zaroxolyn 3 days per week.  Posterior back in several weeks for followup.

## 2010-09-24 NOTE — Progress Notes (Signed)
HPI Patient is seen back for shortness of breath and fluid overload.  I saw her last September 10, 2010.  At that time decision was made to proceed with echo to reassess LV function.  Her ejection fraction is 60%.  She has only mild diastolic dysfunction.  Zaroxolyn was added for several days since her last visit.  She has definitely diuresis.  Her weight is down 5 pounds.  She feels better but she has some nasal congestion unrelated to other problems.  Her edema is decreased Allergies  Allergen Reactions  . Codeine     Current Outpatient Prescriptions  Medication Sig Dispense Refill  . amitriptyline (ELAVIL) 25 MG tablet Take 25 mg by mouth at bedtime.        Marland Kitchen aspirin 81 MG tablet Take 81 mg by mouth daily.        Marland Kitchen atorvastatin (LIPITOR) 80 MG tablet Take 80 mg by mouth daily.        . carvedilol (COREG) 25 MG tablet TAKE 1 TABLET BY MOUTH 2 TIMES A DAY  60 tablet  6  . clopidogrel (PLAVIX) 75 MG tablet Take 75 mg by mouth daily.        . furosemide (LASIX) 40 MG tablet Take 80 mg by mouth daily.        . insulin NPH-insulin regular (HUMULIN 70/30) (70-30) 100 UNIT/ML injection as directed.        . isosorbide dinitrate (ISORDIL) 10 MG tablet Take 10 mg by mouth 2 (two) times daily.        . metFORMIN (GLUCOPHAGE) 500 MG tablet Take 500 mg by mouth 2 (two) times daily with a meal.        . methylPREDNISolone (MEDROL) 4 MG tablet Take 4 mg by mouth as directed.        . metolazone (ZAROXOLYN) 2.5 MG tablet Take 1 tab daily 4 days a week   20 tablet  6  . olmesartan (BENICAR) 40 MG tablet Take 20 mg by mouth daily.        . pregabalin (LYRICA) 100 MG capsule Take 100 mg by mouth 3 (three) times daily.          History   Social History  . Marital Status: Married    Spouse Name: N/A    Number of Children: N/A  . Years of Education: N/A   Occupational History  . Not on file.   Social History Main Topics  . Smoking status: Former Smoker    Quit date: 05/26/1997  . Smokeless tobacco:  Not on file  . Alcohol Use: No  . Drug Use: Not on file  . Sexually Active: Not on file   Other Topics Concern  . Not on file   Social History Narrative  . No narrative on file    Family History  Problem Relation Age of Onset  . Coronary artery disease      Past Medical History  Diagnosis Date  . Tachycardia     Sinus tachycardia  . Fluid overload   . Overweight   . DM (diabetes mellitus)   . Dyslipidemia   . SOB (shortness of breath)   . CAD (coronary artery disease)     MI in 2000 - MI  2007 - treated bare metal stent (no nuclear since then as 9/11)  . HTN (hypertension)   . Anemia   . Osteoarthritis   . Sinusitis 1/12  . Ejection fraction     good,echo,2007 / 60-65%, echo,  September 19, 2010  . RBBB (right bundle branch block)     Old    No past surgical history on file.  ROS  Patient denies fever, chills, headache, sweats, rash, change in, change in hearing, chest pain, cough, nausea vomiting, urinary symptoms.  All other systems are reviewed and are negative.  PHYSICAL EXAM Patient is stable today.  She has some nasal congestion.  She is oriented to person time and place.  Affect is normal.  There is no xanthelasma.  Lungs are clear.  Respiratory effort is unlabored.  Cardiac exam reveals a swollen S2.  No clicks or significant murmurs.  The abdomen is soft.  Her edema is definitely decreased. Filed Vitals:   09/24/10 1353  BP: 106/70  Pulse: 76  Resp: 18  Height: 5\' 6"  (1.676 m)  Weight: 267 lb 12.8 oz (121.473 kg)    EKG   No EKG was done today.  ASSESSMENT & PLAN

## 2010-09-27 ENCOUNTER — Telehealth: Payer: Self-pay | Admitting: Cardiology

## 2010-09-27 NOTE — Telephone Encounter (Signed)
Pt calling re blood results from tuesday

## 2010-09-27 NOTE — Telephone Encounter (Signed)
Left message to call back  

## 2010-10-01 NOTE — Telephone Encounter (Signed)
Pt's husband calling back re msg left yesterday-pls call pt [phone]

## 2010-10-01 NOTE — Telephone Encounter (Signed)
Left message to call back  

## 2010-10-01 NOTE — Telephone Encounter (Signed)
Spoke w/pt she is aware of lab results, she is not using salt substitute or high K foods, she will watch diet adn will recheck at next ov

## 2010-10-08 NOTE — Assessment & Plan Note (Signed)
[place] OFFICE NOTE   Susan [name], BARQUIN                      MRN:          [id number]  DATE:10/01/2006                            DOB:          06-12-1950    [name] is seen for followup.  See my last note of September 14, 2006.  The patient continues to have increased heart rate.  On Coreg  50 mg b.i.d.  she is feeling better, but she still has had significant  increase in heart rate.  The patient has increase in heart rate when she  exercises.  Her resting heart rate today is 95 and this clearly is  better than what she has had.  I have considered whether or not using  Lopressor instead of Carvedilol would be a better choice in her case.  She does have coronary disease.  However, she also has diabetes and she  has had depression and I have chosen not to switch to Lopressor at this  time.   PAST MEDICAL HISTORY:   ALLERGIES:  CODEINE   MEDICATIONS:  Benicar, Plavix, Centrum Silver, NovoLog, Lipitor,  aspirin, Lyrica, Imdur, Naprosyn and Carvedilol 50 b.i.d.   OTHER MEDICAL PROBLEMS:  See the extensive list on my note of September 14, 2006.   REVIEW OF SYSTEMS:  The patient is frustrated that we are not helping  her more.  She has asked if she might be able to see other doctors at  our group and I made it clear to her that I would be happy for her to  have this at any time.  She has mentioned Dr. Haroldine Laws. I offered to  have her followup with Dr. Haroldine Laws.  She would like to follow with me  at this time, but we may ask for help from Dr. Haroldine Laws.  I told her  this might be particularly helpful as a cardiopulmonary exercise test  may be the next thing we need to do.  Otherwise her review of systems is  negative.   PHYSICAL EXAMINATION:  Her weight is 250.  This continues to increase.  She needs to lose weight.  Blood pressure is 154/94 with a pulse of 95  at rest.  The patient is oriented to  person, time and place.  Her affect reveals  that she is somewhat depressed about her overall situation.  HEENT:  Reveals no xanthelasma.  She has normal extraocular motion.  There are no carotid bruits.  There is no jugular venous distention.  LUNGS:  Are clear.  Respiratory effort is not labored.  CARDIAC:  Reveals an S1 with an S2.  There are no clicks or significant  murmurs.  ABDOMEN:  Reveals normal bowel sounds.  She has no peripheral edema.   I sent the patient for a 24-hour urine for catecholamines. The  norepinephrine level is high at 95. Dopamine is normal.  The  normetanephrine evaluation is high at 471 and metanephrine's total are  high at 539.  These are not severely elevated by they are abnormal and  she needs for assessment for this.  I discussed this with her and we  will request endocrine evaluation.   PROBLEMS INCLUDE:  1. History of anemia in the past.  Her most recent hemoglobin was 11.0      on 09/14/2006.  This will need further evaluation by her primary      MDs.  2. Hypertension.  Blood pressure is better today.  3. History of myocardial infarction in 2000 and again in 2007, treated      with bare metal stent.  She has mild ongoing residual disease.  4. Shortness of breath.  The etiology remains unclear.  We may      consider a cardiopulmonary exercise test next.  I have not done      that yet as I have been trying to get a handle on why she has had      such significant tachycardia.  5. History of good left ventricular function.  6. Dyslipidemia.  7. Diabetes.  8. Osteoarthritis.  9. Depression.  10.Obesity.  11.Volume overload. This has been stabilized.  12.Sinus tachycardia.  On higher dose Coreg she is more stable, but      still has tachycardia.  She has normal left ventricular functions.      As described her norepinephrine and metanephrine's are abnormal.      They are not markedly elevated, but they are abnormal.  We will try      to arrange for  endocrinology consultation.     Carlena Bjornstad, MD, [facility]  Electronically Signed    JDK/MedQ  DD: 10/01/2006  DT: 10/01/2006  Job #: [id number]   cc:   Jacelyn Pi, M.D.

## 2010-10-08 NOTE — Assessment & Plan Note (Signed)
[place] OFFICE NOTE   KALEEYA, MEEHL                      MRN:          [id number]  DATE:05/31/2007                            DOB:          [birth year]    [name] is seen for follow-up.  She tells me that she is now being  seen for primary care at [facility] and I am happy to  send a copy of our notes there.  The patient continues to have some  chest pain and shortness of breath.  However, this is chronic and in  fact she is quite stable with it at this point.  Her overall demeanor  today is much more relaxed than I have seen her on prior visits.  She  continues to look for a job but she is doing relatively well.   PAST MEDICAL HISTORY:  ALLERGIES:  CODEINE.   MEDICATIONS:  Plavix, multivitamin, Lipitor, Lyrica, carvedilol,  citalopram, Benicar, Zolpidem, aspirin, furosemide, Lantus, NovoLog and  fish oil.   OTHER MEDICAL PROBLEMS:  See the list below.   REVIEW OF SYSTEMS:  Other than her HPI, her review of systems today is  negative.   PHYSICAL EXAM:  Blood pressure is 120/81 with a pulse of 71.  The  patient is oriented to person, time and place; affect is normal.  HEENT:  Reveals no xanthelasma.  She has normal extraocular motion.  There are no carotid bruits.  There is no jugular venous distention.  LUNGS:  Clear, respiratory effort is not labored.  CARDIAC:  Reveals an S1 with an S2.  There are no clicks or significant  murmurs.  ABDOMEN:  Soft, she has no masses or bruits.  She has no peripheral edema.   EKG is unchanged.  She has a relatively low QRS amplitude.   PROBLEMS:  1. History of anemia in the past.  By my records her most recent      hemoglobin was in the range of 11.  2. History of hypertension with blood pressure controlled today.  3. Status post myocardial infarction 2000 and again in 2007, treated      with bare metal stent.  She has mild ongoing  residual disease.  4. Some shortness of breath for which I saw her several times in 2008.      Etiology was not clear to me.  We stabilized her volume status.  I      have considered a cardiopulmonary exercise test but I feel that is      not necessary at this time.  She had resting sinus tachycardia and      this is improved.  She does not have evidence of pheochromocytoma.  5. History of good left ventricular function.  6. Dyslipidemia.  7. Diabetes.  8. Osteoarthritis.  9. Depression.  10.Obesity.  11.Volume overload, stable.  12.Sinus tachycardia by history.  She has good LV function and no      evidence of pheochromocytoma.  She was actually seen by Dr. [name]      of Endocrinology in [place] who felt  that her catecholamines,      after repeated, showed no significant abnormality and she needed no      further workup.   The patient is stable.  She needs no further cardiac workup at this  time.  I will see her back in 1 year for cardiology follow-up.     Carlena Bjornstad, MD, [facility]  Electronically Signed    JDK/MedQ  DD: 05/31/2007  DT: 05/31/2007  Job #: [id number]   cc:   [place]

## 2010-10-08 NOTE — Assessment & Plan Note (Signed)
[place] OFFICE NOTE   Susan [name]                      MRN:          [id number]  DATE:11/30/2006                            DOB:          11/23/1950    Susan [name] is doing well.  On higher dose Coreg, she appears to do very  well.  She had catecholamines checked, and they appeared to be somewhat  elevated.  They were then repeated by Dr. [name], and they were okay, and  she needs no further workup in this regard.   PAST MEDICAL HISTORY:   ALLERGIES:  CODEINE.   MEDICATIONS:  Benicar (this has now been stopped as her blood pressure  is now lower), Plavix, vitamin, insulin, Lipitor, aspirin, Imdur,  Carbetolol 25 q.i.d. or 50 b.i.d.   OTHER MEDICAL PROBLEMS:  See the extensive list on my note of Oct 01, 2006.   REVIEW OF SYSTEMS:  She is feeling well today.  She has no significant  complaints.  She has lost 9 pounds, and I am quite pleased with her  status.   PHYSICAL EXAMINATION:  VITAL SIGNS:  Weight 241 pounds.  Blood pressure  118/76 with a pulse of 83.  The patient is oriented to person, time, and  place.  Affect is normal.  HEENT:  No xanthelasma.  She has normal extraocular motion.  LUNGS:  Clear.  Respiratory effort is not labored.  NECK:  She has no jugular venous distention.  There are no carotid  bruits.  CARDIAC:  An S1 with an S2.  There are no clicks or significant murmurs.  She has no significant peripheral edema.   Problems are listed completely on my note of Oct 01, 2006.  1. Hypertension.  Blood pressure is much better today, and she will      stay off Benicar.  2. Sinus tachycardia.  This has now stabilized on high dose      Carbetolol, and there is no evidence of pheochromocytoma.   I will see her back for cardiology followup in 6 months.     Susan Bjornstad, MD, [facility]  Electronically Signed    JDK/MedQ  DD: 11/30/2006  DT: 11/30/2006  Job #: [id number]   cc:    Jacelyn Pi, M.D.

## 2010-10-11 ENCOUNTER — Other Ambulatory Visit: Payer: Self-pay | Admitting: Cardiology

## 2010-10-11 NOTE — Assessment & Plan Note (Signed)
[place] OFFICE NOTE   Susan [name]                      MRN:          [id number]  DATE:09/14/2006                            DOB:          12-17-1950    [name] unfortunately continues to not do well.  She continues short  of breath.  She has had her diuretics stopped because of renal effects  by her endocrinologist.  She has continued persistent sinus tachycardia.  I know that her TSH has been normal.  She has had a significant weight  loss but continues to feel poorly.  She is limited in rehab because of  her rapid heart rate.  It is of note that at rehab her blood pressures  have ranged from 123456 to 123XX123 systolic.  Today her pressure is higher.  She will need careful followup.   PAST MEDICAL HISTORY:  Allergies:  CODEINE.   Medications:  Benicar, Plavix, Centrum, B12, NovoLog, Lipitor, aspirin,  Coreg 25 b.i.d. with the dose to be increased, Qualaquin, and pain  medications.   Other medical problems:  See the list below.   REVIEW OF SYSTEMS:  She is very frustrated by the fact that she cannot  feel better and that she has persistent shortness of breath and  tachycardia.  She does have some tingling in her left arm.  Otherwise,  review of systems is negative.   PHYSICAL EXAMINATION:  VITAL SIGNS:  Blood pressure was quite high when  first checked at 215/120 and then improved.  As mentioned, her blood  pressure repeatedly checked at rehab runs from 123456 to 99991111 systolic.  GENERAL:  The patient is oriented to person, time and place.  Affect is  normal.  LUNGS:  Clear.  Respiratory effort is not labored.  HEENT:  There is no xanthelasma.  There is normal extraocular motion.  There are no carotid bruits.  CARDIAC:  Reveals an S1 with an S2.  There are no clicks or significant  murmurs.  ABDOMEN:  Soft.  She has no significant peripheral edema.   EKG reveals sinus tachycardia with incomplete  right bundle-branch block  which has been seen before.   PROBLEMS:  1. History of anemia in the past.  We will check a CBC.  2. Hypertension.  Her pressure is quite high today but I believe that      this is spurious relative to her other measures and she will have      careful followup.  3. History of myocardial infarction in 2000 and another in 2007,      treated with a bare-metal stent.  She has mild ongoing residual      disease.  For now, with her blood pressure high, I feel it will be      prudent to take her off Plavix.  4. Shortness of breath.  Etiology remains unclear.  5. Good left ventricular function.  6. Dyslipidemia.  7. Diabetes.  8. Osteoarthritis.  9. Depression.  10.Obesity.  11.Volume overload.  This appears to be stable, although she has been  taken off her diuretics.  12.Sinus tachycardia.  We will push her Coreg dose significantly      higher and then see her back.  Also, because of persistent      tachycardia that is unexplained in the face of normal thyroid      functions and variations in her blood pressure, will do a 24-hour      urine for metanephrines and catecholamines to rule out      pheochromocytoma.     Carlena Bjornstad, MD, [facility]  Electronically Signed    JDK/MedQ  DD: 09/14/2006  DT: 09/14/2006  Job #: [id number]   cc:   Jacelyn Pi, M.D.

## 2010-10-11 NOTE — Cardiovascular Report (Signed)
[name], [name]               ACCOUNT NO.:  [id number]   MEDICAL RECORD NO.:  [id number]          PATIENT TYPE:  INP   LOCATION:  1823                         FACILITY:  [place]   PHYSICIAN:  [name], M.D. [facility] OF BIRTH:  [birth year]   DATE OF PROCEDURE:  11/24/2005  DATE OF DISCHARGE:                              CARDIAC CATHETERIZATION   INDICATIONS:  [name] is a 60 year old who was previously undergone  stenting of the right coronary by Dr. Olevia Perches in 2000.  She now presents with  an acute inferior infarction to the emergency room.  She was brought  emergently the cath lab for further evaluation and treatment.  The patient  is an insulin-dependent diabetic, and sugars have been out of control.   PROCEDURE:  1.  Left heart catheterization  2.  Selective coronary arteriography.  3.  Hand injection of the left ventricle.  4.  PTCA and stenting of the right coronary artery.   DESCRIPTION OF PROCEDURE:  The patient was brought to the catheterization  laboratory and prepped and draped in usual fashion.  Through an anterior  puncture, the right femoral artery was easily entered and a 6-French sheath  was placed.  Views of the left coronary artery were obtained.  Following  this, views of the right coronary were obtained with a guiding catheter.  ACT was checked and found to be appropriate.  Slight adjustments with  minimal heparin addition was then administered, and eptifibatide given  according to protocol and the patient received a 600 mg clopidogrel load in  the emergency room.  We used a Programmer, applications and were able to easily cross,  and the 2-mm 15 Maverick balloon was placed across the lesion and dilatation  was done with re-establishment of flow, resolution of chest pain, and  resolution of ST elevation.  There was not significant bradycardia when this  occurred.  Following this, higher inflations were performed with marked  improvement in appearance of the  artery.  The patient has a history of some  rectal bleeding that prompted discontinuation of aspirin, and based on this  we placed a stent in the right coronary artery.  A 23 x 2.75 Vision stent  was placed as a non drug-eluting platform stent given history of GI bleeding  with antiplatelet therapy.  The stent was initially taken up to about 10-11  atmospheres.  Following this we used a Quantum Maverick to [name] dilate the  right coronary artery throughout with pressures as high as 16 atmospheres in  the center of the stent.  There was marked improvement the appearance of the  artery.  Intracoronary nitroglycerin was then administered and all catheters  were removed.  Intravenous Lopressor was given in the laboratory.  Overall,  she tolerated the procedure well with dramatic improvement in chest pain.   The cath lab arrival was at 2039 and balloon inflation was a 22:55, leading  to a cath lab arrival to balloon inflation time of 16 minutes.   HEMODYNAMIC DATA:  1.  Central aortic pressure 140/86, mean 116.  2.  Left ventricular pressure 140/23  demonstrating no gradient in pullback      across aortic valve.   ANGIOGRAPHIC DATA:  1.  Ventriculography was done using hand injection.  Overall ventricular      function appeared preserved, although we could not see specific wall      motion because of the limited hand injection.  2.  The left main is free of critical disease.  3.  The LAD has about 50% segmental plaquing at the origin of the diagonal.      The diagonal itself has about 50% ostial narrowing and then a 70% to 80%      mid-narrowing and what is otherwise a relatively small vessel distally.  4.  The circumflex system has a small ramus intermedius with about 70%      ostial narrowing, but is a small caliber vessel.  The large marginal      branch has 60% to 70% proximal segmental plaquing across the sub branch.      The remainder of the circumflex is without critical disease.  5.   The right coronary artery has about 40% to 50% narrowing proximally,      then a diffuse area of mild in-stent re-narrowing of about 50% in the      previously placed stent. The vessel distal to this is totally occluded.      Downstream the vessel consisted of bifurcating posterior descending and      a moderate size posterolateral branch.  Following reperfusion therapy      and stenting with a 23 x 2.75 stent taken at 3 atmospheres, there was      reduction from 100% down to 0%, and restoration of complete TIMI-III      flow with good myocardial blush.   CONCLUSION:  1.  Insulin-dependent diabetes mellitus.  2.  Acute inferior wall myocardial infarction treated with primary      percutaneous coronary intervention with stenting using a non-drug-      eluting platform due to history of rectal bleeding on aspirin.  3.  Scattered three-vessel coronary artery disease.   PLAN/DISPOSITION:  The patient will be treated with aspirin and clopidogrel.  Beta blockade will be used and statins will be used as appropriate as well  as antidiabetic therapy.  Follow-up will be with Dr. Haroldine Laws or Dr. Ron Parker.      [name], M.D. [facility]  Electronically Signed     TDS/MEDQ  D:  11/25/2005  T:  11/25/2005  Job:  [id number]   cc:   [name], M.D. [facility]  [address]  [address]  [place]  [place] [id number]   Glori Bickers, M.D. [place]  [place] [address]   Jeffrey C. Huey Bienenstock, M.D.  [place] [address] [place]  [place] [address]

## 2010-10-11 NOTE — Assessment & Plan Note (Signed)
[place] OFFICE NOTE   Susan [name] Susan [name]                      MRN:          [id number]  DATE:04/21/2006                            DOB:          07/24/1950    Patient returns in followup from the visit on April 09, 2006.  At  that time, we had pushed her diuretic dose higher.  BMET is rechecked,  and her creatinine has gone up to 1.7.  I am not comfortable with this,  and her Lasix dose will be decreased back down to 40 daily.  The patient  still has shortness of breath.  She does notice also that she has  increased heart rate and pounding when she tries to do things at work.   PAST MEDICAL HISTORY:  See my list on my April 09, 2006 note.   ALLERGIES:  CODEINE.   MEDICATIONS:  Metformin, Benicar, Toprol (to be changed to Coreg with a  higher dose), Plavix 75, multivitamins, NovoLog, Lipitor 80, aspirin  325, Imdur, Lyrica 75, meloxicam, Lasix 80 (to be reduced to 40),  Qualaquin 325 at night.   REVIEW OF SYSTEMS:  Otherwise, her review of systems is negative.   PHYSICAL EXAMINATION:  VITAL SIGNS:  Today her weight is 239 pounds.  It  is down an additional 2 pounds.  Blood pressure is 100/66 with a pulse  of 90.  GENERAL:  The patient is oriented to person, time, and place, and her  affect is normal.  LUNGS:  Clear.  Respiratory effort is not labored.  HEENT:  No xanthelasma.  She has normal extraocular motion.  NECK:  There are no carotid bruits.  There is no jugular venous  distention.  CARDIAC:  An S1 and S2.  There are no clicks or significant murmurs.  ABDOMEN:  Obese but soft.  She has no peripheral edema today.   Her BMET was checked on November 27.  Creatinine of 1.7 with a BUN of  23.  This is an increase.  Her baseline creatinine had been in the range  of 1.2.  Her Lasix dose is to be lowered.   PROBLEMS:  Listed on the prior note.  1. Ongoing shortness of breath.  I believe that we  have her dry and      possibly drier than she needs to be.  Her resting heart rate may be      playing a role here, and we will push her beta blocker.  She does      have good left ventricular function by history.  2. Volume overload.  I believe 40 of Lasix will be the optimal dose      for her.   The plan is to reduce her Lasix from 80 to 40 and to change her Toprol  XL 50 to Coreg 12.5 b.i.d. and then plan to see her for followup and  then increase it to 25 b.i.d.     Carlena Bjornstad, MD, [facility]  Electronically Signed    JDK/MedQ  DD: 04/21/2006  DT: 04/22/2006  Job #: [id number]

## 2010-10-11 NOTE — Assessment & Plan Note (Signed)
[facility] OFFICE NOTE   Marvis Repress SHAMARRA RAMONE                      MRN:          [id number]  DATE:12/31/2005                            DOB:          1950-10-19    SUBJECTIVE:  [name] is a very pleasant 60 year old female patient  followed by Dr. Ron Parker, who was recently admitted November 24, 2005, with an  inferior ST elevation myocardial infarction.  She was treated with a bare-  metal stent to the RCA.  She had some residual coronary disease as noted  below.  She was seen in the office July 18th with complaints of dyspnea.  At  that time, we sent her up for a chest CT which showed no evidence of  pulmonary embolism.  There was evidence of a small pericardial effusion and  old granulomatous disease.  She followed up again in the office with Ignacia Bayley, ANP, nurse practitioner, on December 17, 2005.  She continued to complain  of dyspnea.  He set her up for a re-look echocardiogram as she did have  pericardial effusion on her chest CT.  This showed normal left ventricular  systolic function, no left ventricular regional wall motion abnormalities.  Left atrial size was upper limits of normal.  She did have a minimal  pericardial effusion, without hemodynamic compromise.  She returns to the  office today for followup.  She continues to have significant symptoms of  shortness of breath with exertion.  This is with any type of exertion that  she does.  She was sweeping the porch this morning and noted that she became  quite dyspnea.  She is usually fairly comfortable at rest.  She does  continue to have difficulties with orthopnea, and she has been sleeping on 3  pillows as of late.  Denies any paroxysmal nocturnal dyspnea.  Denies any  syncope.  She does get lightheaded when she is dyspneic.  She does have some  atypical left-sided chest pains that are sharp and last only seconds.  This  is completely unlike her  myocardial infarction pain.  She does break out in  a cold sweat when she tries to exert herself, and becomes short of breath.   PAST MEDICAL HISTORY:  As noted above, it is significant for coronary  disease, status [name] inferior wall myocardial infarction in May 2000.  She  had percutaneous transluminal coronary angioplasty and stenting of the RCA  with bare-metal stent.  She had an inferior ST elevation myocardial  infarction November 24, 2005, treated with a bare-metal stent to the RCA beyond  the previously placed stent.  Her residual disease included 50% stenosis at  the origin of the LAD, diagonal with 50% ostial stenosis, and 70% to 80% mid-  stenosis.  The circumcision had 70% ostial stenosis, the ramus intermedius  had 60% to 70% proximal stenosis in a large marginal branch.  The RCA had  40% to 50% proximal stenosis, mild in-stent re-stenosis of about 50%, and  then total occlusion after the stent, which was stented as noted above.  Her  EF was well preserved.  She has a history of hypertension, hyperlipidemia,  obesity, depression, osteoarthritis, and diabetes mellitus type 2.  Her  sugars have been labile of late, and her endocrinologist is adjusting her  medications.   CURRENT MEDICATIONS:  Metformin 1 gram b.i.d.; Benicar 40 mg daily; Toprol-  XL 50 mg a day; Plavix 75 mg a day; Centrum Silver; B12; NovoLog as  directed; Lipitor 80 mg nightly; __________ mg a day; Tylenol p.r.n.   ALLERGIES:  CODEINE.   SOCIAL HISTORY:  She denies any current tobacco or alcohol abuse.  She is a  prior smoker with about a 30 pack-year history.  She quit 7 years ago.  Denies any alcohol abuse.   FAMILY HISTORY:  Significant for coronary disease.   REVIEW OF SYSTEMS:  Please see HPI.  Denies any fevers, chills, melena,  hematochezia, hematuria, dysuria.  Denies any odynophagia or dysphagia.  She  does have a dry cough.  The rest of review of systems is negative.   PHYSICAL EXAMINATION:   GENERAL:  She is a well-nourished, well-developed  female in no distress.  VITAL SIGNS:  Blood pressure is 123/83, pulse 95, weight 232 pounds; this is  up about 2 pounds since her last visit.  HEENT:  Head normocephalic, atraumatic.  Eyes, PERRLA.  Sclerae are clear.  NECK:  Without jugular venous distension.  LYMPH:  Without lymphadenopathy.  ENDOCRINE:  Without thyromegaly.  VASCULAR:  Without carotid bruits bilaterally.  Femoral arteries without  bruits bilaterally.  Dorsalis pedis and posterior tibialis pulses 2+  bilaterally.  CARDIAC:  Normal S1, S2.  Regular rate and rhythm without murmurs, clicks,  rubs or gallops.  LUNGS:  Clear to auscultation bilaterally without wheezing, rhonchi or  rales.  Good air movement.  ABDOMEN:  Soft, nontender, with normal, active bowel sounds.  No  organomegaly.  EXTREMITIES:  Without edema.  Calves are soft, nontender bilaterally.  NEUROLOGICAL:  She is alert and oriented x 3.  Cranial nerves II-XII are  grossly intact.  No focal deficits noted.  SKIN:  Warm and dry.   Electrocardiogram reveals normal sinus rhythm with a heart rate of 90;  inferior Q waves; otherwise, no ST/T wave changes.   DATA BASE:  Chest CT, as noted above, was negative for pulmonary embolism  December 10, 2005.  It was positive for calcified right upper lobe nodule and  right hilar lymph nodes, consistent with old granulomatous disease.  Her BNP  was 61.  Echocardiogram, as noted above, showed good LV function and minimal  pericardial effusion.   IMPRESSION:  1.  Dyspnea.  2.  Coronary artery disease.      1.  Status [name] inferior wall infarction May 2000, treated with bare-          metal stent to right coronary artery.      2.  Status [name] inferior ST elevation myocardial infarction on November 24, 2005, treated with a bare-metal stent to right coronary artery.      3.  Residual disease as noted above. 3.  Good left ventricular function; minimal pericardial  effusion by recent      echocardiogram.  4.  Treated hypertension.  5.  Treated dyslipidemia.  6.  Diabetes mellitus, type 2.  7.  Osteoarthritis.  8.  Depression.  9.  Obesity.   PLAN:  The patient continues to have difficulty with dyspnea, and I question  whether or  not this is an anginal equivalent.  She is euvolemic on  examination.  She has had a normal BNP and her chest CT showed no pulmonary  embolism when it was done a couple of weeks ago.  There was a minimal  pericardial effusion on echocardiogram, but this does not seem to be a  likely cause of her symptoms.  There is no rub on examination.  I discussed  her case with Dr. Verl Blalock today.  We plan to further evaluate her with a  cardiac catheterization in our outpatient laboratory.  I have placed her on  Imdur 15 mg a day to see if this will help her symptoms.  She knows that if  she develops any worsening symptoms, she should go directly to the emergency  room.                                  Richardson Dopp, PA-C                            Marijo Conception. Verl Blalock, MD, [facility]   SW/MedQ  DD:  12/31/2005  DT:  12/31/2005  Job #:  [id number]

## 2010-10-11 NOTE — Assessment & Plan Note (Signed)
[facility] OFFICE NOTE   Susan [name]                      MRN:          [id number]  DATE:05/15/2006                            DOB:          1950-09-19    Susan [name] is again seen for followup.  I saw her on April 21, 2006.  At that time, I was concerned that we may have been pushing her Lasix  too hard and we backed off to 40 mg daily.  Also, I changed her Toprol  to Coreg.  She now feels that her heart rate is under better control and  she is feeling better at night.  I am pleased with this result.  She  continues to have some resting sinus tachycardia.   PAST MEDICAL HISTORY:   ALLERGIES:  CODEINE.   MEDICATIONS:  1. Metformin.  2. Benicar.  3. Plavix.  4. Vitamins.  5. NovoLog.  6. Lipitor.  7. Aspirin.  8. Imdur.  9. Lyrica.  10.Meloxicam.  11.Lasix 40.  12.Coreg 12.5 b.i.d.  13.__________  at night.   OTHER MEDICAL PROBLEMS:  See the complete list from my note of April 09, 2006.   REVIEW OF SYSTEMS:  Overall, she is feeling better and has no major  complaints today.   PHYSICAL EXAMINATION:  GENERAL:  The patient is overweight.  Her weight  is 238 pounds, very similar to her last visit.  This has decreased  somewhat over time.  VITAL SIGNS:  Her resting heart rate remains at 95.  Blood pressure is  150/95.  MENTAL STATUS:  She is oriented to person, time, and place.  Her affect  is normal.  HEENT:  Reveals no xanthelasma.  She has normal extraocular motion.  NECK:  There are no carotid bruits.  There is no jugular venous  distention.  CARDIAC:  Reveals an S1 with an S2.  There are no clicks or significant  murmurs.   PROBLEMS:  Are listed on my note of April 09, 2006.   A continued mild resting sinus tachycardia.  We will adjust her Coreg up  higher and then I will see her for followup.     Susan Bjornstad, MD, [facility]  Electronically Signed    JDK/MedQ  DD:  05/15/2006  DT: 05/16/2006  Job #: [id number]

## 2010-10-11 NOTE — Assessment & Plan Note (Signed)
Susan [name], Susan [name]                      MRN:          [id number]  DATE:07/02/2006                            DOB:          [birth year]    [name] is here today and unfortunately she has lost her job. Of  course, this is extremely stressful for her. With this, she is having  some shortness of breath and some chest discomfort. She has not had any  syncope or pre-syncope. Her diabetes is out of control and she is being  seen to try and help with this.   PAST MEDICAL HISTORY:   ALLERGIES:  CODEINE   MEDICATIONS:  1. Metformin.  2. Benicar.  3. Plavix.  4. Vitamins.  5. NovoLog.  6. Lipitor.  7. Aspirin.  8. Imdur.  9. Lyrica.  10.Meloxicam.  11.Lasix.  12.Coreg 25 mg b.i.d.  13.Terbinafine.   OTHER MEDICAL PROBLEMS:  See the list below.   REVIEW OF SYSTEMS:  A review of systems reveals that the patient is  upset and is having chest pain and shortness of breath related to her  anxiety. Otherwise, her review of systems is negative.   PHYSICAL EXAMINATION:  GENERAL: The patient is oriented to person, time,  and place, and she is upset.  VITAL SIGNS: Weight 245 pounds which is increased by 7 pounds since I  saw her in December. Blood pressure is 130/85, and her pulse is 90.  LUNGS: Clear. Respiratory effort is not labored.  HEENT: She has no xanthelasma. She has normal extraocular motion.  NECK: Reveals no bruits.  CARDIAC: Reveals an S1 with an S2. There no clicks or significant  murmurs.  ABDOMEN: Obese, but soft.  EXTREMITIES: No peripheral edema.   EKG does show that she has sinus rate and that she has an incomplete  right bundle branch block. There are no acute changes.   PROBLEMS:  1. History of hemoglobin 10.7.  2. Hypertension.  3. History of an acute MI in 2000 and another one in 2007, treated      with a bare metal stent.  4. She has only mild ongoing  residual disease.  5. Shortness of breath. She diuresed previously and did well.  6. Good LV function.  7. Dyslipidemia.  8. Diabetes.  9. Osteoarthritis.  10.Depression. This clearly now will be worse with her being out of      work.  11.Obesity.  12.Volume overload, stable.  13.Mild resting sinus tachycardia. We had improved with her higher      dose of Coreg, but she is upset today and her heart rate is up. We      will not change her medicines at this time.   The patient is now recommended for cardiac rehabilitation. She could not  go before because of her full time work, but she can go now and this  will be arranged. I will see her back in 6 weeks.     Carlena Bjornstad, MD, [facility]  Electronically Signed    JDK/MedQ  DD: 07/02/2006  DT: 07/03/2006  Job #: [id number]   cc:   Jacelyn Pi, M.D.

## 2010-10-11 NOTE — Discharge Summary (Signed)
[name], [name]               ACCOUNT NO.:  [id number]   MEDICAL RECORD NO.:  [id number]          PATIENT TYPE:  INP   LOCATION:  3709                         FACILITY:  [facility]   PHYSICIAN:  Glori Bickers, M.D. LHCDATE OF BIRTH:  [birth year]   DATE OF ADMISSION:  11/24/2005  DATE OF DISCHARGE:  11/28/2005                                 DISCHARGE SUMMARY   Greater than 30 minutes on this discharge.   PRINCIPAL DIAGNOSES:  1.  Admitted with acute inferior myocardial infarction.  2.  Emergent catheterization November 24, 2005.      1.  Left anterior descending 50% segmental plaquing at the origin of the          diagonal.      2.  Diagonal 50% ostial narrowing and 70-80% midpoint narrowing.      3.  Circumflex has a ramus intermedius with 70% ostial, large marginal          and 60-70% proximal.      4.  Right coronary artery 40-50% narrowing proximally.  Previously          placed stent 50% in-stent restenosis.  Distal to the stent, the          right coronary artery is totally occluded.      5.  Stent of the right coronary artery just distal to the previously          placed right coronary artery stent with restoration of TIMI-III          flow.          1.  Troponin zenith 35.08.  3.  Increased hemoglobin A1c at 10.5, representing poor glucose control.      1.  Diabetes management has arranged outpatient diabetes referral.      2.  Diabetes management recommendations as follows:  I:  Discontinue 70/30 injections b.i.d.  II:  Start Lantus at 50 units at bedtime and up-titrate until a.m. fasting  glucose is about 150.  III:  Six units of NovoLog before each meal.  1.  With 72 hours cardiac rehab with slowly increased stamina, walking      independently at discharge.   PROCEDURES:  November 24, 2005, emergent left heart catheterization with a  finding of overall ventricular function preserved and occlusion of the right  coronary artery just past existing stent in the right coronary  artery.  The  patient has had a Vision 23 x 2.75 stent placed.  This is a non-drug-eluting  stent.  The patient has a history of GI bleeding with antiplatelet therapy.   BRIEF HISTORY:  [name] is a 60 year old female.  She has a history of  diabetes, obesity and coronary artery disease.  She is status [name] inferior  wall myocardial infarction in May 2000.  This was treated with stenting by  Dr. Olevia Perches.   The patient had a Cardiolite study in November 2003.  Her ejection fraction  was 51%.  There was no ischemia.   She was doing well until the evening of July 2, when she had acute onset of  chest pain.  She took several nitroglycerins without relief.  The pain  continued to escalate.  She came to the emergency room and electrocardiogram  showed inferior ST elevation.  Immediately upon arrival of cardiology  personnel, a code was called.  The patient will be taken emergently to the  cath lab for catheterization and most probably PCI.   HOSPITAL COURSE:  The patient presented with acute onset of chest pain with  escalation on the evening of July 2.  She was taken emergently to the cath  lab, where a culprit lesion in the right coronary artery just past the pre-  existing stent was observed.  This was reopened with placement of a Vision  stent.  This is a non-drug-eluting stent.  The patient will be placed on  aspirin and Plavix for the indefinite future.  In the [name]-catheterization  period it was determined that her serum blood glucose was elevated.  Her  hemoglobin A1c was also an elevated value.  Some time has been spent on  achieving proper dosage for her diabetes.  To this event, diabetes  management personnel were contacted.  The recommendations of these folks  have been listed above, including discontinuing Novolin 70/30 and  substituting Lantus.  The patient did require several days of inpatient  rehabilitation with the cardiac rehab team.  She went from 2-person assist  to  [place], to walking independently at the time of discharge.  In  fact, she is eager to go home today on discharge day July 6.   She is discharged on a low-sodium, low-cholesterol diabetic diet.  She is  asked not to drive for the next week.   DISCHARGE MEDICATIONS:  1.  Lipitor 80 mg daily at bedtime.  This is a new dose.  2.  Lantus 15 units at bedtime.  This is new medication.  3.  Plavix 75 mg daily.  This is new.  4.  Enteric-coated aspirin 325 mg daily.  This is also new.  Both the Plavix      and aspirin work to keep the stents open.  5.  Toprol XL 50 mg daily.  This is new.  6.  Insulin 70/30, 70 units in the morning, 70 units in the evening.  7.  Metformin 500 mg b.i.d.  8.  Benicar 20 mg daily.  9.  Centrum multivitamin daily.  10. B-complex daily.  11. The patient is to stop taking atenolol.   The patient is to contact Dr. [facility] Heron office for an appointment in the next  2 weeks.  She has followup at [facility] [place]. [place]. #1 to  see physician assistant for [name]-cath visit Wednesday, December 10, 2005, at  2:15 in the afternoon.  She will see Dr. Ron Parker Friday, September 7, at 10:15.   LABORATORY DATA:  As mentioned above, hemoglobin A1c was 10.5.  Complete  blood count on July 5 showed hemoglobin 11, hematocrit 32.2, white cells 9,  platelets 348.  Serum electrolytes on July 5 showed sodium 137,  potassium 3.8, chloride 103, bicarbonate 27, BUN 38, creatinine 1.4, glucose  123.  Liver function studies on July 5 showed SGOT 29, SGPT 26, alkaline  phosphatase 83.  Troponin-I studies in serial fashion are 20.76, then 35.08,  then 27.39.  TSH this admission was 2.879.      Sueanne Margarita, P.A.      Glori Bickers, M.D. [facility]  Electronically Signed    GM/MEDQ  D:  11/28/2005  T:  11/28/2005  Job:  [id number]   cc:   Dola Argyle, M.D.  [address]  [address]  [name]  [place] [address]  Darrick Penna. Linna Darner, M.D. [facility]  [phone]  [place] [address]

## 2010-10-11 NOTE — H&P (Signed)
NAMEQUANTA, RISDON NO.:  [id number]   MEDICAL RECORD NO.:  [id number]          PATIENT TYPE:  EMS   LOCATION:  MAJO                         FACILITY:  [facility]   PHYSICIAN:  Glori Bickers, M.D. LHCDATE OF BIRTH:  1950/07/11   DATE OF ADMISSION:  11/24/2005  DATE OF DISCHARGE:                                HISTORY & PHYSICAL   PRIMARY CARE PHYSICIAN:  Dr. Linna Darner.   CARDIOLOGY:  Dr. Daryel November.   REASON FOR ADMISSION:  Acute inferior wall myocardial infarction.   HISTORY OF PRESENT ILLNESS:  [name] is a 60 year old woman with a  history of diabetes, obesity and coronary artery disease status [name]  inferior wall myocardial infarction in May 2000 treated with PTCA and  stenting by Dr. Olevia Perches.  Details are unavailable.  She had a Cardiolite in  November 2003 with EF of 51% but no ischemia.  She was doing well until this  evening about 5:30 p.m. when she had acute onset of chest pain.  She took  several nitroglycerins without relief.  The pain continued to escalate. She  came to the ER and EKG showed inferior ST elevation and we are called to  evaluate.  Upon my arrival to the ER I immediately called a code STMI.   REVIEW OF SYSTEMS:  She denies any recent fevers, fevers or chills.  No  significant heart failure.  She has had minimal bright red blood per rectum  which she attributes to hemorrhoids.  She has not had any melena.  She is  not taking Plavix.   PAST HISTORY:  1.  Coronary artery disease.      1.  Status [name] inferior wall myocardial infarction on May 2000 treated          with PTCA and stenting.  Setting the follow-up Cardiolite 2003          showed EF of 51% electrically positive for ischemia but perfusion          imaging were negative.  EF 51%.  2.  Obesity.  3.  Chronic hypertension.  4.  Chronic hyperlipidemia.   MEDICATIONS:  She does not know her medications.   ALLERGIES:  She is allergic to [facility] which causes itching.   SOCIAL HISTORY:  She lives in [place] with her husband.  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  History of tobacco but quit about 8 years  ago and no significant alcohol.   FAMILY HISTORY:  Both mother and father had history of coronary artery  disease, details currently unavailable due to the emergency duration.   PHYSICAL EXAM:  VITAL SIGNS:  She is moaning in pain.  Blood pressure is  147/90, heart rates 90.  She is afebrile.  She is on face mask saturating in  the high 90s.  HEENT: Sclerae anicteric.  EOMI.  There is no xanthelasmas.  Mucous  membranes are moist.  Neck is supple.  JVP is about 7 cm water.  Carotids  are 2+ bilaterally with no audible bruits.  CARDIAC:  She has distant heart sounds regular rate  and rhythm.  No obvious  murmurs, rubs or gallops.  LUNGS: Clear.  ABDOMEN: Obese, nontender, nondistended, no hepatosplenomegaly, no bruits.  No masses.  EXTREMITIES:  Warm with no cyanosis or clubbing.  DP pulses are 1+  bilaterally.  NEUROLOGY:  She is alert, oriented x3.  Cranial nerves II-XII intact.  Moves  all four extremities.   EKG shows sinus bradycardia at a rate of 59 with about 1.5 mm of inferior ST  elevation with some early Q-waves.  Labs are pending.   ASSESSMENT/PLAN:  1.  Acute inferior wall ST elevation myocardial infarction.  We will proceed      urgently with cardiac catheterization.  She has been loaded with 600 mg      Plavix and has also received prophylaxis for shellfish allergy.      Glori Bickers, M.D. [facility]  Electronically Signed     DB/MEDQ  D:  11/24/2005  T:  11/24/2005  Job:  [id number]   cc:   Darrick Penna. Linna Darner, M.D. [facility]  [phone]  [place]  [place] [address]   Dola Argyle, M.D.  [address] [place]  [place] [address]

## 2010-10-11 NOTE — Assessment & Plan Note (Signed)
[place] OFFICE NOTE   JOLETH, JULICH                      MRN:          [id number]  DATE:04/09/2006                            DOB:          1950/08/07    [name] is here for cardiology followup. Please see my complete note of  March 19, 2006. When I saw the patient on March 19, 2006, she was volume  overloaded. She is doing a good job of cutting down her salt and fluid  intake. We gave her 80 mg of Lasix for 2 days and then 40 mg once daily.  Since that time, she has diuresed. Her weight is down 9 pounds. She is  feeling better, but is still having some swelling. She has also had her feet  evaluated and she was placed on a Quinine preparation for leg cramps and a  medication, meloxicam, that is generic for Mobic.   Overall, she is feeling somewhat better.   PAST MEDICAL HISTORY:   ALLERGIES:  CODEINE.   MEDICATIONS:  Metformin, Benicar, Toprol, Plavix, Centrum, NovoLog, Lipitor,  aspirin, Imdur, meloxicam, Lasix 40 mg (to be increased to 80 mg), Lyrica 75  mg.   OTHER MEDICAL PROBLEMS:  See the list below.   REVIEW OF SYSTEMS:  Other than the HPI, the review of systems is negative.   PHYSICAL EXAMINATION:  The patient is well developed and well nourished and  she is overweight at 241 pounds. The patient is oriented x 3. Affect is  normal.  EYES: Reveal no xanthelasma. She has normal extraocular motion.  There are no carotid bruits. There is no jugular venous distension.  LUNGS: Clear. Respiratory effort is not labored.  CARDIAC: S1 with an S2. There are no clicks or significant murmurs.  ABDOMEN: Obese, but soft.  The patient had some mild edema in her knees above where her support hose  are being worn.  She has no major musculoskeletal deformities.  VITAL SIGNS: Blood pressure 104/68, pulse 80.  The patient did have labs when she was here. Her hemoglobin is 10.7 and this  will  need followup with her primary care doctor. Glucose was 236, BUN was  40, creatinine 1.3. TSH was normal.   PROBLEMS:  1. Hemoglobin of 10.7, this needs followup with her primary physician.  2. Hypertension, stable.  3. History of an acute myocardial infarction. This was in 2000, and an      inferior myocardial infarction in 2007 treated with a bare metal stent.      There is mild residual disease.  4. Ongoing shortness of breath. This is improving as she has diuresed.  5. Continued good left ventricular function.  6. Dyslipidemia, treated.  7. Diabetes, treated.  8. Osteoarthritis.  9. Depression.  10.Obesity.  11.Volume overload. She is responding. We need to push a little harder      with her diuretic. We will make arrangements to check a BMET at some      point. Lasix is to be increased to 80 mg daily. With the patient on non-  steroidal and higher dose Lasix, we need to be sure that her renal      function remains stable.  12.Continued mild resting sinus tachycardia. I am not going to adjust her      medications today, but keep this in mind.   I will see her back in approximately 10 days.     Carlena Bjornstad, MD, [facility]  Electronically Signed    JDK/MedQ  DD: 04/09/2006  DT: 04/10/2006  Job #: [id number]   cc:   Kathrin Penner, M.D.

## 2010-10-11 NOTE — Cardiovascular Report (Signed)
[name], Susan [name]               ACCOUNT NO.:  [id number]   MEDICAL RECORD NO.:  [id number]          PATIENT TYPE:  OIB   LOCATION:  1963                         FACILITY:  [facility]   PHYSICIAN:  Minus Breeding, M.D.   DATE OF BIRTH:  [birth year]   DATE OF PROCEDURE:  01/05/2006  DATE OF DISCHARGE:  01/05/2006                              CARDIAC CATHETERIZATION   PRIMARY CARE PHYSICIAN:  None.   CARDIOLOGIST:  Dola Argyle, M.D.   PROCEDURE:  Left and right heart catheterization/coronary arteriography.   INDICATIONS:  Evaluate patient with dyspnea, chest discomfort, coronary  disease status post stenting x2 of her right coronary artery with bare-metal  stents first in 2000 and then in 2007.   PROCEDURE NOTE:  Left heart catheterization performed via the right femoral  artery.  Right heart catheterization performed via the right femoral vein.  Both vessels are a candidate using an anterior wall puncture.  A 4-French  arterial sheath and 7-French venous sheath were inserted via the modified  Seldinger technique.  Preformed Judkins and a pigtail catheter were  utilized.  The patient tolerated the procedure well and left the lab in  stable condition.   RESULTS:   HEMODYNAMIC DATA:  RA mean 6, RV 30/6, PA 27/13 with a mean of 21.  Pulmonary capillary wedge pressure mean 7.  Cardiac output/cardiac index  3.9/1.9.  AO 139/79, LV 139/20.   Coronaries of the left main was normal.  The LAD was small and did not wrap  the apex.  There was mid-30% stenosis.  There was apical 25% stenosis.  First diagonal was large with mid 25-30% stenosis.  The circumflex and the  AV groove was normal.  There was a mid-obtuse marginal which was large and  branching which had proximal 30% stenosis.  Posterolateral was small and  normal.  The right coronary artery was dominant.  There was proximal long  25% stenosis.  There were two stents in the mid-segment. The first stent had  long diffuse 25-30%  stenosis.  The second stent had in-stent mild luminal  irregularities.  The remainder of the vessel was free of high-grade disease  including moderate-sized PDA.   LEFT VENTRICULOGRAM:  The left ventriculogram was obtained in the RAO  projection.  The EF was 60% with normal wall motion.  Occlusion residual  nonobstructive coronary artery disease, well-preserved ejection fraction.   PLAN:  The patient will continue to have secondary risk reduction.  No  further cardiovascular testing is suggested.          ______________________________  Minus Breeding, M.D.    JH/MEDQ  D:  01/05/2006  T:  01/05/2006  Job:  [id number]   cc:   Dola Argyle, M.D.

## 2010-10-11 NOTE — Discharge Summary (Signed)
NAMELAKEEMA, CHEESE NO.:  [id number]   MEDICAL RECORD NO.:  [id number]          PATIENT TYPE:  INP   LOCATION:  3709                         FACILITY:  [facility]   PHYSICIAN:  Glori Bickers, M.D. [facility] OF BIRTH:  [birth year]   DATE OF ADMISSION:  11/24/2005  DATE OF DISCHARGE:  11/28/2005                                 DISCHARGE SUMMARY   ADDENDUM:   ALLERGIES:  SHE HAS AN ALLERGY TO SHELLFISH, WHICH CAUSES ITCHING.   ENDOCRINOLOGIST:  Jacelyn Pi, MD   CARDIOLOGIST:  Dola Argyle, MD   PRIMARY CARE GIVER:  Doroteo Bradford. Huey Bienenstock, MD   PRINCIPAL DIAGNOSES:  1.  Admitted November 24, 2005, with acute inferior myocardial infarction.  2.  Emergency left heart catheterization November 24, 2005, with finding of 100%      occlusion in the right coronary artery just distal to a previously      placed stent.  Prior stent was placed May 2000.  3.  Troponin I study elevation.  Zenith Troponin I is 35.08.  4.  A Vision 23 x 2.75 non-drug-eluting platform stent was placed at the      culprit lesion, restoring TIMI III flow.  5.  Elevated hemoglobin A1c of 10.5 this admission.      1.  Diabetes Management consult with outpatient diabetes referral.      2.  Diabetes Management recommendations as follows:  (1)  Discontinue insulin 70/30.  (2)  Start Lantus at 50 units and up-titrate until fasting blood glucose is  about 150.  (3)  NovoLog 6 units before meals.  1.  Cardiac rehab, 72 hours, with slowly increased stamina, walking      independently at discharge.   PROCEDURE:  November 24, 2005, emergent left heart catheterization with a finding  that the left ventricular function is preserved, and the right coronary  artery has the culprit lesion, which is located as a total occlusion of the  vessel just distal to a previously placed stent.  The stent itself has a  mild, 50% in-stent restenosis.  TIMI III flow was reestablished by placement  of a stent, reducing 100% lesion to  0.   BRIEF HISTORY:  Susan [name] is a 60 year old female.  She has a history of  diabetes, obesity, and coronary artery disease.  She is status [name] inferior  wall myocardial infarction, May 2000.  She had stenting at that time by Dr.  Olevia Perches.   Cardiolite study was done, November 2003.  Her ejection fraction was 51%.  There was no ischemia.   She was doing well until the evening of November 24, 2005, when she had acute  onset of chest pain.  Nitroglycerin did not relieve the pain.  The pain  continued to escalate.  She came to the emergency room.  Electrocardiogram  showed inferior ST elevations, and Cardiology was called to consult.  Code  was immediately called and the patient taken urgently to the catheterization  lab.   HOSPITAL COURSE:  The patient received stent to the right coronary artery  just distal to a previously existing stent, reestablishing  complete flow to  the right coronary artery.  She has had no [name]-procedural chest pain.  She  has taken about 72 hours to regain her stamina.  To this end, she has had  the Cardiac Rehab Team working with exercising.  The patient is walking  independently at the time of discharge.  In addition, Diabetes Management  was consulted for elevated hemoglobin A1c, and their recommendations have  been dictated above.  The patient, however, is not going home on the full  recommendation therapy.  This would be up to Dr. [name], who sees her for her  diabetes, and will see her after discharge.   DISCHARGE MEDICATIONS:  1.  Lipitor 80 mg daily at bedtime.  2.  Lantus 15 units at bedtime.  3.  Plavix 75 mg daily.  4.  Enteric-coated aspirin 325 mg daily.  5.  Toprol XL 50 mg daily.  6.  Insulin 70/30, 70 units in the morning, 70 units in the evening.  7.  Metformin 500 mg twice daily.  8.  Benicar 20 mg daily.  9.  Centrum multivitamin daily.  10. B complex daily.   She will see Dr. [name] December 29, 2005.  This appointment has already been   made.  She will follow [name] up at [facility], [name]-cath visit,  Wednesday, December 10, 2005, at 2:15.  She will see Dr. Ron Parker Friday, January 30, 2006, at 10:15.   This dictation, 20 minutes.      Sueanne Margarita, P.A.      Glori Bickers, M.D. [facility]  Electronically Signed    GM/MEDQ  D:  11/28/2005  T:  11/28/2005  Job:  [id number]   cc:   Jacelyn Pi, M.D.  Fax: [phone]

## 2010-10-11 NOTE — Assessment & Plan Note (Signed)
[place] OFFICE NOTE   Susan [name] SHARDI DAUPHINAIS                      MRN:          [id number]  DATE:03/19/2006                            DOB:          [birth year]    Susan [name] has known coronary disease.  I actually saw her last in 2003.  She had an MI in 2000.  Fortunately, her ejection fraction had improved over  time.  The patient then had a MI in July 2007.  It was an inferior ST  elevation MI.  She received a bare metal stent to her right coronary.  She  does have some residual disease.  She had ongoing complaints of shortness of  breath and a chest CT showed no pulmonary embolus.  She has a small  pericardial effusion.  She was seen in our office in followup.  Ultimately,  she had a followup catheterization and this was done on January 05, 2006.  The stented area was stable.  It was felt her ejection fraction was 60%.  It  was felt that medical treatment was appropriate.  She was last seen in the  office on December 31, 2005 and the catheterization was done after that.   The patient returns now and she is having increased shortness of breath.  She has gained a great deal of weight.  She feels bloated.  She has  shortness of breath.  She is not having significant chest pain.  She is not  having any syncope or presyncope.  She tells me that she is careful with her  salt intake.  She does drink a large amount of fluid.  This was encouraged  by one of her daughters when she mentioned that she was not urinating a lot.  She drinks 5 or 6 containers of water a day that are probably at least 12  ounces.  I have strongly encouraged her to cut back on her fluid intake.   PAST MEDICAL HISTORY:   ALLERGIES:  CODEINE.   MEDICATIONS:  Metformin, Benicar, Toprol, Plavix, Centrum, NovoLog, Lipitor,  aspirin, Imdur, hydrochlorothiazide (to be stopped), and Lyrica.   OTHER MEDICAL PROBLEMS:  See the complete list  below.   REVIEW OF SYSTEMS:  She is not having any GI or GU symptoms.  She does  mention that she is very irritable.  Otherwise, her review of systems is  negative.   PHYSICAL EXAM:  The patient is oriented to person, time, and place.  Affect  is normal.  She is mildly agitated as it relates to feeling bad in general.  Her weight is 249 pounds.  Blood pressure is 157/90.  Her pulse rate is 110.  She is on a beta blocker.  HEENT:  Reveals no xanthelasma.  She has normal extraocular motion.  There  are no carotid bruits.  There is no jugular venous distention.  LUNGS:  Reveal a question of a few basilar rales.  There is no respiratory  distress.  CARDIAC:  Reveals an S1 and S2.  There are no clicks or significant murmurs.  ABDOMEN:  Obese.  The patient does have 1 to 2+ peripheral edema.  There are  no major musculoskeletal deformities.   EKG revealed sinus tachycardia.  She has relatively low voltage and has had  this before.   PROBLEMS:  1. Ongoing shortness of breath.  2. Coronary artery disease, status post inferior myocardial infarction in      2000 and an inferior myocardial infarction in 2007 treated with a bare      metal stent.  There is also mild residual disease.  3. Continued good left ventricular function by most recent data.  4. Hypertension.  5. Dyslipidemia.  6. Diabetes.  7. Osteoarthritis.  8. Depression.  9. Obesity.  10.Definite volume overload at this time.  11.Sinus tachycardia.   A BMET will be checked today along with TSH and a CBC.  The patient will be  placed on 80 mg of Lasix for 2 days and then 40 mg daily and I will see her  back in 7 to 10 days.    ______________________________  Carlena Bjornstad, MD, [facility]    JDK/MedQ  DD: 03/19/2006  DT: 03/20/2006  Job #: [id number]

## 2010-10-11 NOTE — Assessment & Plan Note (Signed)
[place] OFFICE NOTE   Susan, [name]                      MRN:          [id number]  DATE:12/17/2005                            DOB:          May 16, 1951    PRIMARY CARDIOLOGIST:  Dr. Dola Argyle.   PRIMARY CARE PHYSICIAN:  Dr. Linna Darner.   PATIENT PROFILE:  This is a 60 year old white female who was recently status  post inferior ST elevation myocardial infarction earlier this month who had  recurrent dyspnea last week.   PROBLEM LIST:  1.  Coronary artery disease.      1.  Status post inferior wall myocardial infarction in May of 2000          treated with percutaneous transluminal coronary angioplasty and          stenting of the right coronary artery with bare metal stent.      2.  In 2003, Cardiolite showed an ejection fraction of 51% with normal          perfusion imaging.      3.  On November 24, 2005, acute inferior ST elevation myocardial infarction          with catheterization revealing a total occlusion of the distal right          coronary artery distal to the previously placed stent.  This was          successfully treated with a 2.7 x 23 mm Vision bare metal stent.  2.  Dyspnea.      1.  On December 10, 2005 a CT of the chest showed no evidence of pulmonary          embolism with a small pericardial effusion and old granulomatous          disease.  3.  Hypertension.  4.  Hyperlipidemia.  5.  Obesity.  6.  Deconditioning.  7.  Depression.  8.  Osteoarthritis.  9.  Type 2 diabetes mellitus.   ALLERGIES:  Shellfish which may cause itching.   HISTORY OF PRESENT ILLNESS:  This 61 year old white female is recently  status post inferior ST elevation MI November 24, 2005 with subsequent stenting  of the right coronary artery with bare metal stent.  She tolerated this  procedure well, then following her discharge, she returned to the office  last week complaining of fairly significant dyspnea on  exertion as well as  dizzy spells.  Apparently, her blood sugar was 37 here in the office and had  been having problems with hypoglycemia secondary to frequent adjustments in  her insulin dosing.  She subsequently followed up with her endocrinologist  and has continued to have down titration of her insulin.  She thinks that  more or less that has been dealt with.  Secondary to the dyspnea, however,  last week she was sent to the ED for another chest CT which was negative for  pulmonary embolism and did show small pericardial effusion.  Over the past  week, she feels as though she is doing better and has continued to  try to  walk on a daily basis just a couple of times up and down her driveway, but  still continues to feel fairly fatigued and continues to have dyspnea on  exertion.  She denies any chest pain.  She is sleeping on 3 pillows at night  up from 2 pillows secondary to orthopnea.  She does not have any PND.  She  is noting dependent edema that progresses throughout the day and is gone  when she wakes up in the morning.  Her appetite is just fine and denies  early satiety.   CURRENT MEDICATIONS:  1.  Metformin 100 mg twice a day.  2.  Benicar 40 mg daily.  3.  Toprol XL 50 mg daily.  4.  Plavix 75 mg daily.  5.  Centrum silver daily.  6.  B12 1000 mg daily.  7.  NovoLog as directed.  8.  Lipitor 80 mg at bedtime.  9.  Aspirin 325 mg daily.  10. Tylenol 2 tabs every 4 hours as needed.   PHYSICAL EXAMINATION:  VITAL SIGNS:  Blood pressure 122/72, heart rate 102,  respirations 16, she is afebrile.  Weight is 230 pounds which is up 4 pounds  since her last visit.  GENERAL:  Pleasant white female in no acute distress, awake, alert, oriented  x3.  NECK:  No bruits or JVD.  LUNGS:  Respirations regular and unlabored.  CARDIAC:  Regular tachycardic, S1 S2, no S3 or S4 or murmurs.  I do not  appreciated a rub.  ABDOMEN:  Obese, soft, nontender, nondistended, bowel sounds are  present.  No hepatosplenomegaly.  EXTREMITIES:  Warm, dry, pink, no cyanosis, clubbing, or edema.  Dorsalis  pedis, posterior tibial pulses 2+ bilaterally.   ACCESSORY CLINICAL FINDINGS:  EKG shows sinus tachycardia with rate of 103  beats per minute with a right bundle branch block, and inferior Q's.  No  change from last week.  Chest CT December 10, 2005 showed no PE with a small  pericardial effusion.  Lab work in the ED showed a hemoglobin of 11.1,  hematocrit 34.4, WBC 8.8, platelets 381, sodium 139, potassium 4.6, chloride  108, CO2 24, BUN 31, creatinine 1.1, glucose 72.  BNP was 61.   ASSESSMENT/PLAN:  1.  Persistent dyspnea.  The patient is tachycardic on exam.  I do not      appreciate a rub.  She did have a small pericardial effusion by chest CT      a week ago.  She was not significantly anemia nor did she appear to be      dehydrated by her lab work last week.  We will obtain a 2-D      echocardiogram to rule out worsening effusion or tamponade physiology.      She did not have a pulsus.  She did not have any acute changes on her      EKG to indicate ischemia.  2.  Coronary artery disease.  With the exception of the dyspnea she has been      experiencing as above, she has not had any recurrent chest pain.  She is      tolerating her medications well and remains on beta blocker, ARB,      Plavix, aspirin, and statin therapy.  3.  Hypertension.  This is relatively well-controlled and continue current      medications.  4.  Hyperlipidemia.  Continue statin therapy.  5.  Obesity.  I question whether or not deconditioning  plays at least some      role in her dyspnea on exertion.  She should participate in cardiac      rehabilitation.  6.  Type 2 diabetes mellitus followed by her endocrinologist.  She is      currently, over the past several weeks, she has been having her insulin     doses adjusted and down titrated as she has been experiencing      hypoglycemia.   DISPOSITION:   The patient will followup with Dr. Ron Parker in 2-4 weeks.                                Susan [name], ANP    CB/MedQ  DD:  12/17/2005  DT:  12/17/2005  Job #:  [id number]   cc:   Satira Sark, MD

## 2010-10-11 NOTE — Assessment & Plan Note (Signed)
[place] OFFICE NOTE   Marvis Repress JAYNIE HAWKSWORTH                      MRN:          [id number]  DATE:12/10/2005                            DOB:          10-12-1950    SUBJECTIVE:  [name] is a 60 year old female patient, who has been  followed by Dr. Ron Parker in the past, who was recently admitted with inferior ST-  elevation myocardial infarction.  She underwent cardiac catheterization by  Dr. Lia Foyer who placed a bare metal stent in her RCA distal to the  previously placed stent.  She tolerated the procedure and was discharged  home on November 28, 2005.  Her hemoglobin A1c was significantly elevated during  her hospitalization at 10.5.  Her diabetic medications were adjusted, and  she has been following with Dr. [name], her endocrinologist.  She presents to  the office today for followup.  She notes significant shortness of breath  with just minimal exertion.  She also notes sleeping on 3 pillows since  going home.  She denies any paroxysmal nocturnal dyspnea.  She has noted  some chest soreness when she changes position but no recurrent angina.  She  denies any syncope or pleuritic chest pain.  She did note some rectal  bleeding in the hospital, and this stopped sometime this week. She says at  times it has been somewhat significant.  She denies any melena.   PAST MEDICAL HISTORY:  Significant for:  1.  Coronary disease status [name] inferior ST-elevation myocardial infarction      November 24, 2005, treated with bare metal stent to the RCA.  At      catheterization, she was found to have residual disease.  Her left main      was normal. LAD had 50% stenosis at the origin.  The diagonal had 50%      ostial stenosis and 70-80% mid stenosis.  The circumflex had 70% ostial      stenosis, and the ramus intermedius, which was small, and 60-70%      proximal stenosis in a large marginal branch.  The RCA had 40-50%  proximal stenosis, mild in-stent restenosis of about 50%, and then total      occlusion after the stent.  Her ejection fraction was well preserved.  2.  History of diabetes mellitus.  3.  Hyperlipidemia.  4.  Depression.  5.  Osteoarthritis.   MEDICATIONS:  1.  Lorazepam.  2.  Aspirin 325 mg daily.  3.  Toprol XL 50 mg daily.  4.  Plavix 75 mg daily.  5.  Centrum.  6.  Vitamin B12.  7.  NovoLog 70/30 b.i.d.  8.  Lipitor 80 mg nightly.  9.  Benicar 40 mg daily.   ALLERGIES:  CODEINE.   SOCIAL HISTORY:  She denies any current tobacco or alcohol abuse.   FAMILY HISTORY:  Significant for coronary artery disease.   REVIEW OF SYSTEMS:  Please see HPI.  She denies any fevers, chills, cough.  She denies any numbness, tingling, myalgias, arthralgias.  She denies any  melena or hemoptysis,  or hematemesis.  Rest of Review of Systems is  negative.   PHYSICAL EXAMINATION:  GENERAL: Well-nourished, well-developed female in no  acute distress.  VITAL SIGNS: Blood pressure 108/78 lying with pulse of 74, 112/79 with pulse  of 95 sitting, 122/86 with pulse of 102 standing.  After 2 minutes, pulse  100, blood pressure 111/79.  After 5 minutes, pulse 103, blood pressure  114/81.  HEAD:  Normocephalic and atraumatic.  EYES:  PERRLA.  EOMI.  Sclerae are clear, but palpebral conjunctivae are  pale bilaterally.  OROPHARYNX:  Pale without exudate.  NECK:  Without lymphadenopathy.  ENDOCRINE: Without thyromegaly.  No JVD noted.  CARDIAC:  Normal S1, S2.  Regular rate and rhythm.  LUNGS:  Clear to auscultation bilaterally without wheezing, rhonchi, or  rales.  ABDOMEN:  Soft, nontender.  EXTREMITIES: Without edema.  Calves are soft, nontender.  NEUROLOGIC: Alert and oriented x3.  Cranial nerves II-XII grossly intact.  No focal deficits.  SKIN:  Warm and dry.   Electrocardiogram reveals sinus tachycardia with a heart rate of 105, right  bundle branch block which is new, no ischemic  changes.   Chest x-ray in the office today shows no acute disease.   IMPRESSION:  1.  Dyspnea.  2.  New right bundle branch block.  3.  Coronary artery disease status [name] recent inferior ST-elevation      myocardial infarction, treated with bare metal stent to the right      coronary artery November 24, 2005, with residual coronary artery disease as      noted above.  4.  Recent history of bright red blood per rectum.  5.  Preserved left ventricular function.  6.  Diabetes mellitus, type 2. She had symptomatic hypoglycemia in the      office today.  7.  Hypertension.  8.  Hypercholesterolemia.   PLAN:  The patient was also interviewed and examined by Dr. Velora Heckler today.  Had a long discussion with the patient regarding her symptoms and whether or  not she should go to the hospital.  After further discussion, we have  decided to go ahead and work her up as an outpatient.  With her new right  bundle branch block, sinus tachycardia, and dyspnea, we felt it important to  rule out pulmonary embolism.  We set her up for a CT scan today at [facility], and the results will be called to Dr. Velora Heckler.  Also, the  patient will have a CBC, BMET, and BNP drawn.  Her chest x-ray does not show  significant volume overload, and she seems to be fairly euvolemic on exam.  We will try to run the CBC STAT to rule out significant anemia as the cause  of some of her symptoms since she has had some hematochezia recently.  The  patient will be brought back in close followup within the next week.   The patient did have significant lightheadedness in the office today, and we  checked her glucose.  This was 56, and we gave her a couple of cups of  orange juice.  She felt much better with this. She is to follow up with Dr.  [name] for this.                                  Richardson Dopp, PA-C    SW/MedQ  DD:  12/10/2005  DT:  12/10/2005  Job #:  [id number]  cc:   Doroteo Bradford. Huey Bienenstock, MD  Jacelyn Pi, MD

## 2010-10-28 ENCOUNTER — Encounter: Payer: Self-pay | Admitting: Cardiology

## 2010-10-28 ENCOUNTER — Ambulatory Visit (INDEPENDENT_AMBULATORY_CARE_PROVIDER_SITE_OTHER): Payer: BC Managed Care – PPO | Admitting: Cardiology

## 2010-10-28 DIAGNOSIS — R000 Tachycardia, unspecified: Secondary | ICD-10-CM

## 2010-10-28 DIAGNOSIS — E8779 Other fluid overload: Secondary | ICD-10-CM

## 2010-10-28 DIAGNOSIS — I10 Essential (primary) hypertension: Secondary | ICD-10-CM

## 2010-10-28 MED ORDER — FUROSEMIDE 80 MG PO TABS: 80.0000 mg | ORAL_TABLET | Freq: Two times a day (BID) | ORAL | Status: DC

## 2010-10-28 NOTE — Progress Notes (Signed)
HPI Patient is seen today for followup fluid overload.  When I saw her last I have cut back her Zaroxolyn frequency.  Unfortunately she has regained significant fluid weight.  Her renal function was checked on Sep 24, 2010.  BUN was 33 and creatinine 1.1.  Unexpectedly her potassium was 5.4.  She is not taking potassium.  She is on an ACE inhibitor. Allergies  Allergen Reactions  . Codeine     Current Outpatient Prescriptions  Medication Sig Dispense Refill  . amitriptyline (ELAVIL) 25 MG tablet Take 25 mg by mouth at bedtime.        Marland Kitchen aspirin 81 MG tablet Take 81 mg by mouth daily.        Marland Kitchen atorvastatin (LIPITOR) 80 MG tablet Take 80 mg by mouth daily.        . carvedilol (COREG) 25 MG tablet TAKE 1 TABLET BY MOUTH 2 TIMES A DAY  60 tablet  6  . clopidogrel (PLAVIX) 75 MG tablet Take 75 mg by mouth daily.        . furosemide (LASIX) 40 MG tablet TAKE 2 TABLETS BY MOUTH EVERY DAY IN THE MORNING  60 tablet  4  . insulin NPH-insulin regular (HUMULIN 70/30) (70-30) 100 UNIT/ML injection as directed.        . isosorbide dinitrate (ISORDIL) 10 MG tablet Take 10 mg by mouth 2 (two) times daily.        . metFORMIN (GLUCOPHAGE) 500 MG tablet Take 500 mg by mouth 2 (two) times daily with a meal.        . methylPREDNISolone (MEDROL) 4 MG tablet Take 4 mg by mouth as directed.        . metolazone (ZAROXOLYN) 2.5 MG tablet Take 1 tab daily every Mon, Wed and Fri    20 tablet  6  . olmesartan (BENICAR) 40 MG tablet Take 40 mg by mouth daily.       . pregabalin (LYRICA) 100 MG capsule Take 100 mg by mouth 3 (three) times daily.          History   Social History  . Marital Status: Married    Spouse Name: N/A    Number of Children: N/A  . Years of Education: N/A   Occupational History  . Not on file.   Social History Main Topics  . Smoking status: Former Smoker    Quit date: 05/26/1997  . Smokeless tobacco: Not on file  . Alcohol Use: No  . Drug Use: Not on file  . Sexually Active: Not on  file   Other Topics Concern  . Not on file   Social History Narrative  . No narrative on file    Family History  Problem Relation Age of Onset  . Coronary artery disease      Past Medical History  Diagnosis Date  . Tachycardia     Sinus tachycardia  . Fluid overload   . Overweight   . DM (diabetes mellitus)   . Dyslipidemia   . SOB (shortness of breath)   . CAD (coronary artery disease)     MI in 2000 - MI  2007 - treated bare metal stent (no nuclear since then as 9/11)  . HTN (hypertension)   . Anemia   . Osteoarthritis   . Sinusitis 1/12  . Ejection fraction     good,echo,2007 / 60-65%, echo, September 19, 2010  . RBBB (right bundle branch block)     Old    No past  surgical history on file.  ROS  Patient denies fever, chills, headache, sweats, rash, change in vision, change in hearing, chest pain, cough, nausea vomiting, urinary symptoms.  All other systems are reviewed and are negative.  PHYSICAL EXAM Patient is stable today.  She is volume overloaded.  She is oriented to person time and place.  Affect is normal.  Head is atraumatic.  Lungs are clear.  Respiratory effort is nonlabored.  There is no jugular venous distention.  Cardiac exam reveals S1 and S2.  No clicks or significant murmurs.  Her abdomen is soft.  She has 1-2+ peripheral edema. Filed Vitals:   10/28/10 1440  BP: 108/66  Pulse: 88  Height: 5\' 6"  (1.676 m)  Weight: 276 lb (125.193 kg)    EKG No EKG is done.  ASSESSMENT & PLAN

## 2010-10-28 NOTE — Assessment & Plan Note (Signed)
Unfortunately she is once again significantly fluid overloaded.  I'll need to increase her Lasix back up and the frequency of her Zaroxolyn also will be increased.  I will arrange for followup phone call to see about her weight status.  We will then adjust her diuretics accordingly.  Because her potassium was 5.4 and the last visit unexpectedly, renal function will be checked again today.

## 2010-10-28 NOTE — Assessment & Plan Note (Signed)
Blood pressure is controlled. No change in therapy. 

## 2010-10-28 NOTE — Patient Instructions (Signed)
Increase Furosemide to 80 mg Twice daily  Increase Zaroxolyn to 2.5 mg daily, until we tell you to stop or you lose 9 lbs. Lab today (bmet) Call us everyday with your weight Your physician recommends that you schedule a follow-up appointment in: 2-3 weeks

## 2010-10-28 NOTE — Assessment & Plan Note (Signed)
The patient is not having significant tachycardia.  No change in therapy.

## 2010-10-29 ENCOUNTER — Telehealth: Payer: Self-pay | Admitting: *Deleted

## 2010-10-29 DIAGNOSIS — E8779 Other fluid overload: Secondary | ICD-10-CM

## 2010-10-29 LAB — BASIC METABOLIC PANEL
BUN: 61 mg/dL — ABNORMAL HIGH (ref 6–23)
CO2: 26 mEq/L (ref 19–32)
Calcium: 9.3 mg/dL (ref 8.4–10.5)
Chloride: 100 mEq/L (ref 96–112)
Creatinine, Ser: 1.7 mg/dL — ABNORMAL HIGH (ref 0.4–1.2)
GFR: 32.88 mL/min — ABNORMAL LOW (ref >60.00)
Glucose, Bld: 319 mg/dL — ABNORMAL HIGH (ref 70–99)
Potassium: 5.4 mEq/L — ABNORMAL HIGH (ref 3.5–5.1)
Sodium: 132 mEq/L — ABNORMAL LOW (ref 135–145)

## 2010-10-29 NOTE — Telephone Encounter (Signed)
Lab results from 10/28/10 K 5.4 Cr 1.7 BUN 61. I reviewed with Dr Ron Parker.  He recommended pt repeat BMP 10/30/10. I discussed with pt. Pt states her weight 10/28/10 was 275 and  today 275. Pt just made medication increases today. Dr Ron Parker is aware of this.

## 2010-10-30 ENCOUNTER — Inpatient Hospital Stay (HOSPITAL_COMMUNITY)
Admission: AD | Admit: 2010-10-30 | Discharge: 2010-11-02 | DRG: 533 | Disposition: A | Discharge status: Home / Self Care | Payer: BC Managed Care – PPO | Source: Ambulatory Visit | Attending: Internal Medicine | Admitting: Internal Medicine

## 2010-10-30 ENCOUNTER — Other Ambulatory Visit (INDEPENDENT_AMBULATORY_CARE_PROVIDER_SITE_OTHER): Payer: BC Managed Care – PPO | Admitting: *Deleted

## 2010-10-30 ENCOUNTER — Encounter: Payer: Self-pay | Admitting: *Deleted

## 2010-10-30 ENCOUNTER — Telehealth: Payer: Self-pay | Admitting: *Deleted

## 2010-10-30 DIAGNOSIS — E8779 Other fluid overload: Secondary | ICD-10-CM

## 2010-10-30 DIAGNOSIS — R5383 Other fatigue: Secondary | ICD-10-CM

## 2010-10-30 DIAGNOSIS — I252 Old myocardial infarction: Secondary | ICD-10-CM

## 2010-10-30 DIAGNOSIS — I10 Essential (primary) hypertension: Secondary | ICD-10-CM | POA: Diagnosis present

## 2010-10-30 DIAGNOSIS — R5381 Other malaise: Secondary | ICD-10-CM

## 2010-10-30 DIAGNOSIS — I2510 Atherosclerotic heart disease of native coronary artery without angina pectoris: Secondary | ICD-10-CM | POA: Diagnosis present

## 2010-10-30 DIAGNOSIS — N179 Acute kidney failure, unspecified: Secondary | ICD-10-CM | POA: Diagnosis present

## 2010-10-30 DIAGNOSIS — E875 Hyperkalemia: Secondary | ICD-10-CM | POA: Diagnosis present

## 2010-10-30 DIAGNOSIS — D649 Anemia, unspecified: Secondary | ICD-10-CM | POA: Diagnosis present

## 2010-10-30 DIAGNOSIS — E860 Dehydration: Secondary | ICD-10-CM | POA: Diagnosis present

## 2010-10-30 DIAGNOSIS — I4510 Unspecified right bundle-branch block: Secondary | ICD-10-CM | POA: Diagnosis present

## 2010-10-30 DIAGNOSIS — Z87891 Personal history of nicotine dependence: Secondary | ICD-10-CM

## 2010-10-30 DIAGNOSIS — Z794 Long term (current) use of insulin: Secondary | ICD-10-CM

## 2010-10-30 DIAGNOSIS — E1142 Type 2 diabetes mellitus with diabetic polyneuropathy: Secondary | ICD-10-CM | POA: Diagnosis present

## 2010-10-30 DIAGNOSIS — E1149 Type 2 diabetes mellitus with other diabetic neurological complication: Principal | ICD-10-CM | POA: Diagnosis present

## 2010-10-30 DIAGNOSIS — Z7982 Long term (current) use of aspirin: Secondary | ICD-10-CM

## 2010-10-30 DIAGNOSIS — Z7902 Long term (current) use of antithrombotics/antiplatelets: Secondary | ICD-10-CM

## 2010-10-30 DIAGNOSIS — E785 Hyperlipidemia, unspecified: Secondary | ICD-10-CM | POA: Diagnosis present

## 2010-10-30 LAB — GLUCOSE, CAPILLARY
Glucose-Capillary: 333 mg/dL — ABNORMAL HIGH (ref 70–99)
Glucose-Capillary: 234 mg/dL — ABNORMAL HIGH (ref 70–99)

## 2010-10-30 LAB — BASIC METABOLIC PANEL
BUN: 74 mg/dL — ABNORMAL HIGH (ref 6–23)
CO2: 26 mEq/L (ref 19–32)
Calcium: 8.5 mg/dL (ref 8.4–10.5)
Chloride: 94 mEq/L — ABNORMAL LOW (ref 96–112)
Creatinine, Ser: 2.9 mg/dL — ABNORMAL HIGH (ref 0.4–1.2)
GFR: 17.79 mL/min — ABNORMAL LOW (ref >60.00)
Glucose, Bld: 509 mg/dL (ref 70–99)
Potassium: 5.3 mEq/L — ABNORMAL HIGH (ref 3.5–5.1)
Sodium: 130 mEq/L — ABNORMAL LOW (ref 135–145)

## 2010-10-30 LAB — HEPATIC FUNCTION PANEL
ALT: 30 U/L (ref 0–35)
AST: 24 U/L (ref 0–37)
Albumin: 4 g/dL (ref 3.5–5.2)
Alkaline Phosphatase: 144 U/L — ABNORMAL HIGH (ref 39–117)
Bilirubin, Direct: 0.1 mg/dL (ref 0.0–0.3)
Indirect Bilirubin: 0.3 mg/dL (ref 0.3–0.9)
Total Bilirubin: 0.4 mg/dL (ref 0.3–1.2)
Total Protein: 7.9 g/dL (ref 6.0–8.3)

## 2010-10-30 LAB — CBC
HCT: 35.5 % — ABNORMAL LOW (ref 36.0–46.0)
Hemoglobin: 11.8 g/dL — ABNORMAL LOW (ref 12.0–15.0)
MCH: 26.5 pg (ref 26.0–34.0)
MCHC: 33.2 g/dL (ref 30.0–36.0)
MCV: 79.6 fL (ref 78.0–100.0)
Platelets: 274 10*3/uL (ref 150–400)
RBC: 4.46 MIL/uL (ref 3.87–5.11)
RDW: 16.3 % — ABNORMAL HIGH (ref 11.5–15.5)
WBC: 6.8 10*3/uL (ref 4.0–10.5)

## 2010-10-30 LAB — PRO B NATRIURETIC PEPTIDE: Pro B Natriuretic peptide (BNP): 71.8 pg/mL (ref 0–125)

## 2010-10-30 NOTE — Telephone Encounter (Signed)
Pt aware of need for admission for volume overload and acute renal failure.  A bed a [facility] has been requested thru admitting, Wannetta Sender is aware and precert has been notified.  Pt will be called with a bed assignment when available.

## 2010-10-30 NOTE — Telephone Encounter (Signed)
Per pt - her weight is the same at 275 lbs.  Reviewed lab results with pt who is aware they are extremely abnormal.  She is aware I will speak with Dr Ron Parker and call her back with instructions.  Reviewed labs with Dr Ron Parker who gave orders for pt to be a direct admit for volume over load and acute renal failure.

## 2010-10-30 NOTE — Telephone Encounter (Signed)
Left message for pt to call back about what her weight was this am

## 2010-10-30 NOTE — Telephone Encounter (Signed)
Pt aware to report to admitting department at [facility].

## 2010-10-31 ENCOUNTER — Inpatient Hospital Stay (HOSPITAL_COMMUNITY): Payer: BC Managed Care – PPO

## 2010-10-31 LAB — CBC
HCT: 31.8 % — ABNORMAL LOW (ref 36.0–46.0)
Hemoglobin: 10.5 g/dL — ABNORMAL LOW (ref 12.0–15.0)
MCH: 26.6 pg (ref 26.0–34.0)
MCHC: 33 g/dL (ref 30.0–36.0)
MCV: 80.7 fL (ref 78.0–100.0)
Platelets: 269 10*3/uL (ref 150–400)
RBC: 3.94 MIL/uL (ref 3.87–5.11)
RDW: 16.4 % — ABNORMAL HIGH (ref 11.5–15.5)
WBC: 6.5 10*3/uL (ref 4.0–10.5)

## 2010-10-31 LAB — GLUCOSE, CAPILLARY
Glucose-Capillary: 90 mg/dL (ref 70–99)
Glucose-Capillary: 237 mg/dL — ABNORMAL HIGH (ref 70–99)
Glucose-Capillary: 193 mg/dL — ABNORMAL HIGH (ref 70–99)
Glucose-Capillary: 133 mg/dL — ABNORMAL HIGH (ref 70–99)

## 2010-10-31 LAB — HEMOGLOBIN A1C
Hgb A1c MFr Bld: 9.5 % — ABNORMAL HIGH (ref <5.7)
Mean Plasma Glucose: 226 mg/dL — ABNORMAL HIGH (ref <117)

## 2010-10-31 LAB — TSH: TSH: 4.376 u[IU]/mL (ref 0.350–4.500)

## 2010-10-31 LAB — BASIC METABOLIC PANEL
BUN: 72 mg/dL — ABNORMAL HIGH (ref 6–23)
CO2: 30 mEq/L (ref 19–32)
Calcium: 9 mg/dL (ref 8.4–10.5)
Chloride: 96 mEq/L (ref 96–112)
Creatinine, Ser: 2.59 mg/dL — ABNORMAL HIGH (ref 0.4–1.2)
GFR calc Af Amer: 23 mL/min — ABNORMAL LOW (ref >60)
GFR calc non Af Amer: 19 mL/min — ABNORMAL LOW (ref >60)
Glucose, Bld: 104 mg/dL — ABNORMAL HIGH (ref 70–99)
Potassium: 3.8 mEq/L (ref 3.5–5.1)
Sodium: 137 mEq/L (ref 135–145)

## 2010-10-31 LAB — FOLATE: Folate: 7.5 ng/mL

## 2010-10-31 LAB — CORTISOL: Cortisol, Plasma: 6.7 ug/dL

## 2010-10-31 LAB — VITAMIN B12: Vitamin B-12: 513 pg/mL (ref 211–911)

## 2010-10-31 LAB — IRON AND TIBC
Iron: 31 ug/dL — ABNORMAL LOW (ref 42–135)
Saturation Ratios: 9 % — ABNORMAL LOW (ref 20–55)
TIBC: 327 ug/dL (ref 250–470)
UIBC: 296 ug/dL

## 2010-10-31 LAB — FERRITIN: Ferritin: 57 ng/mL (ref 10–291)

## 2010-11-01 DIAGNOSIS — N289 Disorder of kidney and ureter, unspecified: Secondary | ICD-10-CM

## 2010-11-01 LAB — BASIC METABOLIC PANEL
BUN: 63 mg/dL — ABNORMAL HIGH (ref 6–23)
BUN: 57 mg/dL — ABNORMAL HIGH (ref 6–23)
CO2: 27 mEq/L (ref 19–32)
CO2: 25 mEq/L (ref 19–32)
Calcium: 9 mg/dL (ref 8.4–10.5)
Calcium: 9.1 mg/dL (ref 8.4–10.5)
Chloride: 102 mEq/L (ref 96–112)
Chloride: 100 mEq/L (ref 96–112)
Creatinine, Ser: 1.81 mg/dL — ABNORMAL HIGH (ref 0.4–1.2)
Creatinine, Ser: 1.35 mg/dL — ABNORMAL HIGH (ref 0.4–1.2)
GFR calc Af Amer: 34 mL/min — ABNORMAL LOW (ref >60)
GFR calc Af Amer: 48 mL/min — ABNORMAL LOW (ref >60)
GFR calc non Af Amer: 28 mL/min — ABNORMAL LOW (ref >60)
GFR calc non Af Amer: 40 mL/min — ABNORMAL LOW (ref >60)
Glucose, Bld: 58 mg/dL — ABNORMAL LOW (ref 70–99)
Glucose, Bld: 167 mg/dL — ABNORMAL HIGH (ref 70–99)
Potassium: 4 mEq/L (ref 3.5–5.1)
Potassium: 4.8 mEq/L (ref 3.5–5.1)
Sodium: 138 mEq/L (ref 135–145)
Sodium: 133 mEq/L — ABNORMAL LOW (ref 135–145)

## 2010-11-01 LAB — GLUCOSE, CAPILLARY
Glucose-Capillary: 57 mg/dL — ABNORMAL LOW (ref 70–99)
Glucose-Capillary: 70 mg/dL (ref 70–99)
Glucose-Capillary: 155 mg/dL — ABNORMAL HIGH (ref 70–99)
Glucose-Capillary: 142 mg/dL — ABNORMAL HIGH (ref 70–99)
Glucose-Capillary: 135 mg/dL — ABNORMAL HIGH (ref 70–99)

## 2010-11-01 NOTE — Telephone Encounter (Signed)
Agree 

## 2010-11-02 LAB — URINE CULTURE
Colony Count: 40000
Culture  Setup Time: 201206071907

## 2010-11-02 LAB — GLUCOSE, CAPILLARY: Glucose-Capillary: 113 mg/dL — ABNORMAL HIGH (ref 70–99)

## 2010-11-05 NOTE — Consult Note (Signed)
Susan [name], Susan [name] NO.:  [id number]  MEDICAL RECORD NO.:  [id number]  LOCATION:  2037                         FACILITY:  [facility]  PHYSICIAN:  Cherene Altes, M.D.DATE OF BIRTH:  [birth year]  DATE OF CONSULTATION:  10/30/2010 DATE OF DISCHARGE:                                CONSULTATION   PRIMARY CARE PHYSICIAN:  Jonni Sanger [facility].  PHYSICIAN REQUESTING CONSULTATION:  Carlena Bjornstad, MD, [facility]  REASON FOR CONSULTATION:  Acute renal failure plus severely uncontrolled diabetes mellitus.  HISTORY OF PRESENT ILLNESS:  Susan [name] is a 60 year old female who lives in the [facility] area who has been followed by Dr. Daryel November for volume overload.  Dr. Ron Parker has been working the patient up through his office.  He has been treating the patient with escalating doses of diuretic due to the fact that she has had profound lower extremity edema and significant associated shortness of breath.  She is responded well to her diuretics.  Dr. Ron Parker has been carefully following her serum electrolytes and renal function while he has been diuresing her.  Unfortunately, the patient has reached a point where hyperkalemia and renal insufficiency have limited the further titration of her diuretic.  The patient was seen on October 28, 2010, at which time, a creatinine of 1.7 was appreciated and the patient was noted to be hyperkalemic.  In the interest of following this closely, Dr. Ron Parker arranged for repeat BMET today.  This revealed further worsening of renal function with a creatinine of 2.9 and a potassium of 5.3.  As a result, Dr. Ron Parker arranged for the direct admission of the patient to his service.  The Medical Service was asked to see the patient in consultation after it was found that the patient was also suffering with hyperglycemia to include a serum glucose of 509 and a CBG in excess of 300.  At the time of my evaluation, the patient is resting comfortable  in the hospital bed.  She reports that she is severely thirsty and begs that I allow her to have something to drink.  She denies chest pain.  She states that she has not had any chest pain at all during her recent history.  She does report that she has a shortness of breath.  This is worse with exertion.  She reports that her lower extremity edema is much improved from her baseline, but is, however, still there to an extent. She admits that her diabetes is frequently uncontrolled and her daughter suggest that she oftentimes has blood sugars in excess of 500 at home. She admits to a very erratic diet and erratic use of her insulin.  She does report that she is consistently compliant with her metformin and her oral medications.  REVIEW OF SYSTEMS:  The patient reports that she frequently is awoken during the middle of the night with difficulty breathing.  She states that she will wake up at times gasping for air.  This improves when she simply wakes up and breathe deeply.  She also suffers from the need to urinate multiple times during the night.  She is not aware if she snores or not,  but she does feel somnolent on a regular basis during the day. She has never had an evaluation for sleep apnea per her report.  She denies any diarrhea, fevers, chills, nausea or vomiting.  She denies any wheezing.  She is not presently smoking and has not smoked for 10 years now.  Comprehensive review of systems is otherwise unrevealing.  PAST MEDICAL HISTORY: 1. Severely uncontrolled diabetes mellitus type 2 - diagnosed in 2007. 2. Prior tobacco abuse with discontinuation in 1999. 3. Morbid obesity. 4. Hyperlipidemia. 5. Coronary artery disease, status post myocardial infarction in 2000     and 2007 with PTCA and bare-mental stent placement in 2007. 6. Hypertension. 7. Right bundle branch block. 8. Ejection fraction is 60-65% via echocardiogram on September 19, 2010.  OUTPATIENT MEDICATIONS:  Complete  list of the patient's usual outpatient medications is available as per Dr. Kae Heller admission note.  This is reviewed in detail by this physician.  I have the following corrections to make to this list as per my history. 1. The patient apparently takes NPH insulin 70 units t.i.d. on a     regular basis.  In addition to this, she takes 25 units of regular     insulin when she has a meal.  This is typically only twice a day to     usually around 11 in the morning and then late in the evening. 2. The patient states that she does not remember being prescribed     Medrol or taking this medication any time lately.  She suspect this     may be a mistake in her medication list.  ALLERGIES:  CODEINE.  FAMILY HISTORY:  The patient's mother died of an MI at the age of 80. The patient's father's medical history is unknown to her.  SOCIAL HISTORY:  The patient is married.  She is retired from work in a Physiological scientist.  She does not drink alcohol to any significant extent.  She lives in [place] with her husband.  DATA REVIEWED:  Sodium is low at 130, potassium is high at 5.3, chloride and bicarb are normal.  BUN is markedly elevated at 74, creatinine is markedly elevated at 2.9.  Serum glucose is markedly elevated at 509, calcium is normal.  Hemoglobin is low at 11.8 with a normal MCV, normal platelet count and normal white count.  Creatinine on Sep 24, 2010 was 1.1 with a creatinine on October 28, 2010 of 1.7.  PHYSICAL EXAMINATION:  VITAL SIGNS:  Temperature 98.7, blood pressure 125/53, heart rate 92, respiratory rate 18, O2 sats 98% on room air. GENERAL:  Morbidly obese female in no acute respiratory distress. HEENT:  Normocephalic, atraumatic.  Pupils are equal, round, and reactive to light and accommodation.  OC/OP is clear with dry mucous membranes appreciable. NECK:  No JVD is appreciated. LUNGS:  Clear to auscultation bilaterally without wheezes or rhonchi with distant breath  sounds. CARDIOVASCULAR:  Distant heart sounds, but regular rate and rhythm without gallop or rub with normal S1 and S2. ABDOMEN:  Morbidly obese, soft.  Bowel sounds are present.  There is no organomegaly.  There is no rebound.  There is no ascites. EXTREMITIES:  The patient still has 2+ bilateral lower extremity pitting edema, but she states this is much improved compared to her baseline. There is no significant erythema, cutaneous lesions or clubbing appreciated. NEUROLOGIC:  The patient is alert and oriented x4.  Cranial nerves II through XII are intact bilaterally.  She displays 5/5  strength in bilateral upper and lower extremities.  Neuro exam is nonfocal.  RECOMMENDATIONS: 1. Severely uncontrolled diabetes mellitus - we will check a     hemoglobin A1c.  I suspect this patient has severely uncontrolled     diabetes in fact her usual and not an acute change.  The patient's     daughter has confirmed to me that she routinely has blood sugars     around 500 at home.  I have counseled the patient as the absolute     need for strict control of her diabetes.  We will ask for nutrition     consult and diabetic teaching during the patient's stay and for     this to be followed up as an outpatient.  We will place the patient     on Lantus insulin on a scheduled b.i.d. basis during her hospital     stay.  I will also add a sliding scale insulin.  We will add meal     coverage as well.  I anticipate that we will need to significantly     uptitrate her insulin while she is here.  At the present time, I do     not feel that insulin drip is acutely indicated and in fact it     would be concerned that this could cause more harm than good.  This     patient, as noted above, likely walks around on a regular day with     blood sugars in the 300-500 range.  As a result, slower correction     would be in her best interest as long as no acute deleterious     effects suffered given with her  hyperglycemia.  She is on aspirin     and this will be continued.  Clearly ACE inhibitors have to be held     at the present time. 2. Acute renal failure - this appears to be primarily prerenal     azotemia with a possible component of acute tubular necrosis given     the likely related to the use of high-dose diuretics as well as an     ARB.  At this time, of course, we must hold all diuretics as well     as her Benicar.  We will hydrate her.  I have warned her that her     lower extremity edema will likely recur quite quickly with IV fluid     infusion.  Given that she is producing urine, I do not feel that a     renal ultrasound would provide much new information at this time.     If her renal function does not improve, however, this may be     indicated to evaluate the renal parenchyma.  Likewise, given that     she is on high doses of multiple diuretics, I do not feel that a     fractional excretion of sodium would be reliable.  We could     consider a fractional excretion of creatinine, but I suspect that     the patient's clinical suspicion is significantly high enough that     this is prerenal, but I feel that a simple volume challenge will     give [name] the answers that we need.  If her renal function worsens,     we will consider consulting a nephrologist.  In the setting of her     severely uncontrolled diabetes and her severe lower  extremity     edema, we must keep in mind the possibility of a nephrotic     syndrome.  A 24-hour urine collection may be indicated, but we do     not even have a simple UA at this time and I will start with that.     If in fact there is protein noted on the patient's urinalysis     (which is a virtual guarantee), we will then consider proceeding     with a 24-hour urine collection to quantify the amount of protein     that the patient is losing.  Nephrotic syndrome could certainly     explain a lot of her symptoms at this time. 3. Severe edema - Dr.  Ron Parker has been working this patient's edema up in     the outpatient setting.  Though I do not have all of the     information available to me at this time, an echocardiogram is     indicated in his notes showing revealing an EF of 60-65%.  I have a     few suspicions that could be contributing the patient's edema.     First, she is a very likely suffering with a significant degree of     venous insufficiency given her obesity.  I have counseled her that     weight loss could assist with this.  Perhaps more seriously,     however, I am concerned about the possibility of sleep     apnea/obesity hypoventilation syndrome with concomitant pulmonary     hypertension.  Perhaps Dr. Kae Heller echocardiogram report could be     assessed for pulmonary artery hypertension or evidence of right     heart failure.  Likewise, this patient would likely benefit from a     sleep study in the outpatient setting.  Cardiology will continue to     follow this patient during her hospital stay and we welcome their     advice in this regard as we move forward. 4. Questionable sleep apnea/obesity hypoventilation syndrome - as per     my discussion above, I am concerned that there could be a     significant element of pulmonary hypertension related to sleep     apnea in this case.  I have discussed with the patient my desire     for her to undergo a sleep study once she is stabilized and able to     be discharged.  We will discuss this further during her hospital     stay. 5. Obesity - I have counseled the patient as to the multiple     deleterious health effects of her ongoing obesity.  I have     discussed with her the likely link between her obesity and her     uncontrolled diabetes as well as quite possibly her edema.  I have     educated her on the need for significant long-term weight loss. 6. Hypertension - the patient's blood pressure is currently well     controlled.  This is likely due to volume depletion.   We will need     to follow the patient's blood pressure as we hydrate her. 7. Hyperlipidemia - we will continue the patient's outpatient lipid     medications as has been done by the Cardiology Service. 8. Hyperkalemia - the patient's hyperkalemia is likely related to her     acute renal failure and possibly related to extracellular shift  in     the setting of her hyperglycemia.  Correction of her severe     hyperglycemia will certainly help with this.  Likewise, correction     of her acute renal failure will assist with this.  We will follow     her potassium closely for now with allergies of Kayexalate. 9. Normocytic anemia - the patient is anemic with a hemoglobin of     11.8.  Her MCV is normal.  We will check an anemia panel and guaiac     her stools.  This will be pursued further as indicated.  Thank you very much for consultation on this pleasant lady.  We appreciate the opportunity to participate in her care.  It is my opinion that most of the patient's active issues are actually more medical than cardiac in nature.  As a result, I have spoken with Dr. Verl Blalock and have offered to assume the attending role of this patient during her hospital stay.  Dr. Verl Blalock agrees that this would be appropriate and therefore, we are moving forward with this reassignment.  [facility] will continue to follow from this point of forward as a consultative service to attend to the patient's cardiac needs.     Cherene Altes, M.D.     JTM/MEDQ  D:  10/30/2010  T:  10/30/2010  Job:  [id number]  Electronically Signed by Joette Catching M.D. on 11/04/2010 09:51:30 PM

## 2010-11-05 NOTE — Discharge Summary (Signed)
Susan [name], MULLEN NO.:  [id number]  MEDICAL RECORD NO.:  [id number]  LOCATION:  2037                         FACILITY:  [facility]  PHYSICIAN:  Annita Brod, M.D.DATE OF BIRTH:  May 14, 1951  DATE OF ADMISSION:  10/30/2010 DATE OF DISCHARGE:  11/02/2010                              DISCHARGE SUMMARY   ATTENDING PHYSICIAN:  Annita Brod, MD  PRIMARY CARE PHYSICIAN:  [facility].  CARDIOLOGIST:  Carlena Bjornstad, MD, [facility]  DISCHARGE DIAGNOSES: 1. Acute renal failure secondary to over diuresis, now resolved. 2. 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 3. Tobacco abuse.  Discontinuation in 1999. 4. Obesity. 5. Hyperlipidemia. 6. History of coronary artery disease, status post myocardial     infarction. 7. History of hypertension. 8. History of fight bundle-branch block. 9. Abnormal chest x-ray with benign-appearing nodule done on October 31, 2010.  All diagnoses were present on admission.  DISCHARGE MEDICATIONS: 1. Elavil 25 p.o. nightly. 2. Aspirin 81 p.o. daily. 3. Atorvastatin 80 p.o. daily. 4. Benicar 40 p.o. daily.  The patient is being advised to restart     this medicine on Tuesday, June 12. 5. Coreg 25 p.o. b.i.d.  The patient is being advised to restart this     medicine at half the dose 12.5 p.o. b.i.d. on Monday, June 11.  She     will then go to the whole dose after 1 week on Monday, June 18. 6. Plavix 75 p.o. daily. 7. Fish oil p.o. daily. 8. Lasix 40 p.o. daily.  This medication will be restarted on Monday,     June 11. 9. Imdur 10 p.o. b.i.d. 10.Lyrica 100 p.o. t.i.d. 11.Metformin 500 p.o. b.i.d. 12.Multivitamin p.o. daily. 13.Novolin R insulin regular 25 units subcu daily. 14.NovoLog 75 units subcu t.i.d. 15.Zaroxolyn 2.5 p.o. on Monday, Wednesday, and Friday.  HOSPITAL COURSE:  The patient is a 57-year white  female with past medical history of CAD, uncontrolled diabetes with secondary neuropathy, hypertension, and severe peripheral edema, who is being aggressively diuresed by her cardiologist as an outpatient.  She was brought in June 6.  The patient's creatinine a month prior had been around 1.1.  She had been diuresed at home.  Her creatinine 2 days prior to admission was noted to be 1.7.  She was close to be hyperkalemic when Dr. Ron Parker, followed up her repeat BMET, she was noted to have a creatinine of 2.9 and 5.3 on day of admission.  The patient was admitted to the Hospitalist Service.  She also noted to have a serum glucose of 509. The patient was admitted to hospital, hospice were asked to admit the patient by Dr. Ron Parker.  The patient was started on IV fluids.  Her ACE inhibitor was put on hold.  She was also put on sliding scale insulin in regards to her uncontrolled diabetes mellitus.  An A1c was checked and was found to be elevated at 9.5 with an average blood sugar of 226.  Her blood sugars were turned down to normal with an insulin sliding scale, uncontrolled  diet.  We used Lantus insulin while in the hospital. We discussed this with the patient and I told her we had some extensive discussions about diet and exercise and proper diabetic control.  I advised the patient not rather than try to be too aggressive on immediately returning to home, to make small changes one at a time, so that she would not become burned out and this patient could commit to a better healthy lifestyle.  She says she will indeed try this.  Her PCP will help adjust her insulin and it may be necessary to change her from an insulin from Novolin and NovoLog insulin to a different kind of insulin in the future if need be.  In regards to her neuropathy, we continued her on Elavil.  In regards to her acute renal failure again her ACE inhibitor and diuretics were put on hold because she was initially felt to be  dehydrated and intravascular volume depleted and her Imdur was put on hold as well.  She was gently hydrated over the course of several days and her creatinine came down nicely.  On the day of admission, her creatinine was noted to be at 2.9.  By the morning of June 8, her creatinine is down to 1.8 with BUN of 63.  A followup BMET was ordered for June 9.  However, there was an error in labs, workup was actually done at 2 p.m. on June 8, approximately 10 hours from the previous lab work.  This one noted a BUN of 57 and creatinine of 1.25 given this trend and it is safe to assume that her creatinine is now normalized on June 9, on day of discharge.  At this time, we are planning to discharge the patient to home.  Her blood pressure on the day of discharge with just Imdur restarted was noted to be BP of 102/60 with a heart rate of 86.  I have recommended the following changes.  She will restart back on her Zaroxolyn as scheduled 2.5 mg Monday, Wednesday, Friday.  She will restart back on her Lasix 2 days from now on June 11.  She will restart back on her ARB and Benicar on Tuesday June 12.  She will restart back on her Coreg on June 11, 25 b.i.d. at half the dose 12.5 b.i.d. and after 1 week go to 25.  The patient will then follow up with Dr. Ron Parker, her cardiologist, in the next 1 week and [facility] in the next 2-3 weeks.  Rest of her medical issues were stable.  It was noted, however, that her chest x-ray checked after she was admitted to make sure that she was not volume overloaded and she was noted to have a chest x-ray on June 7, make sure she is not volume overload and no acute cardiopulmonary disease, but she was noted to have an apparent 5 mm density projecting anteriorly in the lateral view necessary to represent a calcified pulmonary nodule granuloma, alternatively this could be outside the chest wall.  Radiology recommended considering plain film followup in 6  months to confirm stability of this appearance.  DISCHARGE DIET:  Heart-healthy and carb-modified diet.  ACTIVITY:  Slowly increase.  DISPOSITION:  Improved.  She is being discharged to home.  The patient will follow up with Dr. Ron Parker in 4 weeks and follow up with her primary MD in the next 1-2 weeks.     Annita Brod, M.D.     SKK/MEDQ  D:  11/02/2010  T:  11/02/2010  Job:  [id number]  cc:   Carlena Bjornstad, MD, [facility]  Electronically Signed by Gevena Barre M.D. on 11/05/2010 [id number] AM

## 2010-11-12 ENCOUNTER — Encounter: Payer: Self-pay | Admitting: Family Medicine

## 2010-11-12 DIAGNOSIS — B001 Herpesviral vesicular dermatitis: Secondary | ICD-10-CM

## 2010-11-12 DIAGNOSIS — F329 Major depressive disorder, single episode, unspecified: Secondary | ICD-10-CM | POA: Insufficient documentation

## 2010-11-12 DIAGNOSIS — F331 Major depressive disorder, recurrent, moderate: Secondary | ICD-10-CM | POA: Insufficient documentation

## 2010-11-12 DIAGNOSIS — F32A Depression, unspecified: Secondary | ICD-10-CM | POA: Insufficient documentation

## 2010-11-21 ENCOUNTER — Encounter: Payer: Self-pay | Admitting: Cardiology

## 2010-11-21 ENCOUNTER — Ambulatory Visit (INDEPENDENT_AMBULATORY_CARE_PROVIDER_SITE_OTHER): Payer: BC Managed Care – PPO | Admitting: Cardiology

## 2010-11-21 DIAGNOSIS — N289 Disorder of kidney and ureter, unspecified: Secondary | ICD-10-CM

## 2010-11-21 DIAGNOSIS — R000 Tachycardia, unspecified: Secondary | ICD-10-CM

## 2010-11-21 DIAGNOSIS — E8779 Other fluid overload: Secondary | ICD-10-CM

## 2010-11-21 NOTE — Progress Notes (Signed)
HPI Patient returns for cardiology followup.  I had seen her last in the office on October 28, 2010.  I felt she was fluid overloaded and I have increased her diuretic.  She continued to have symptoms and became short of breath.  However when she was hospitalized on October 30, 2010 she had renal dysfunction and appeared she was functionally dry.  Her diabetes also was out of control.  Her creatinine was up to 2.6.  She was managed nicely in the hospital and her diuretics were held.  Her creatinine improved to 1.3.  The last value available to me at this time is drawn on November 01, 2010.  She had recent labs and we will try to obtain them.  She is feeling better.  Only in the past day or 2 she had some increase in weight and some mild edema.  I believe it is safe to resume her Lasix at a lower dose. Allergies  Allergen Reactions  . Codeine     Current Outpatient Prescriptions  Medication Sig Dispense Refill  . 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 amitriptyline (ELAVIL) 25 MG tablet Take 25 mg by mouth at bedtime.        Marland Kitchen aspirin 81 MG tablet Take 81 mg by mouth daily.        Marland Kitchen atorvastatin (LIPITOR) 80 MG tablet Take 80 mg by mouth daily.        . carvedilol (COREG) 25 MG tablet TAKE 1 TABLET BY MOUTH 2 TIMES A DAY  60 tablet  6  . clopidogrel (PLAVIX) 75 MG tablet Take 75 mg by mouth daily.        . fish oil-omega-3 fatty acids 1000 MG capsule Take 2 g by mouth daily.        . insulin NPH-insulin regular (HUMULIN 70/30) (70-30) 100 UNIT/ML injection as directed.        . isosorbide dinitrate (ISORDIL) 10 MG tablet Take 10 mg by mouth 2 (two) times daily.        . metolazone (ZAROXOLYN) 2.5 MG tablet 1 tab po prn         . metolazone (ZAROXOLYN) 2.5 MG tablet Take 2.5 mg by mouth as needed.        . nitroGLYCERIN (NITROSTAT) 0.4 MG SL tablet Place 0.4 mg under the tongue every 5 (five) minutes as needed.        . pregabalin (LYRICA) 100 MG capsule Take 100 mg by mouth 3 (three)  times daily.        Marland Kitchen DISCONTD: clonazePAM (KLONOPIN) 0.5 MG tablet Take 0.5 mg by mouth 3 (three) times daily as needed.        Marland Kitchen DISCONTD: furosemide (LASIX) 80 MG tablet Take 1 tablet (80 mg total) by mouth 2 (two) times daily.  60 tablet  6  . DISCONTD: metFORMIN (GLUCOPHAGE) 500 MG tablet Take 500 mg by mouth 2 (two) times daily with a meal.        . DISCONTD: methylPREDNISolone (MEDROL) 4 MG tablet Take 4 mg by mouth as directed.        Marland Kitchen DISCONTD: metolazone (ZAROXOLYN) 2.5 MG tablet Take 1 tab daily every Mon, Wed and Fri    20 tablet  6  . DISCONTD: olmesartan (BENICAR) 40 MG tablet Take 40 mg by mouth daily.       Marland Kitchen DISCONTD: zolpidem (AMBIEN) 10 MG tablet Take 10 mg by mouth at bedtime as needed.  History   Social History  . Marital Status: Married    Spouse Name: N/A    Number of Children: N/A  . Years of Education: N/A   Occupational History  . Not on file.   Social History Main Topics  . Smoking status: Former Smoker    Quit date: 05/26/1997  . Smokeless tobacco: Not on file  . Alcohol Use: No  . Drug Use: Not on file  . Sexually Active: Not on file   Other Topics Concern  . Not on file   Social History Narrative  . No narrative on file    Family History  Problem Relation Age of Onset  . Coronary artery disease      Past Medical History  Diagnosis Date  . Tachycardia     Sinus tachycardia  . Fluid overload   . Overweight   . DM (diabetes mellitus)   . Dyslipidemia   . SOB (shortness of breath)   . CAD (coronary artery disease)     MI in 2000 - MI  2007 - treated bare metal stent (no nuclear since then as 9/11)  . HTN (hypertension)   . Anemia   . Osteoarthritis   . Sinusitis 1/12  . Ejection fraction     good,echo,2007 / 60-65%, echo, September 19, 2010  . RBBB (right bundle branch block)     Old    No past surgical history on file.  ROS  Patient denies fever, chills, headache, rash, sweats, change in vision, change in hearing,  chest pain, cough, nausea vomiting, urinary symptoms.  All other systems are reviewed and are negative.  PHYSICAL EXAM She's stable.  She is overweight.  Head is atraumatic.  Lungs are clear.  Respiratory effort is unlabored.  Cardiac exam reveals S1 and S2.  No clicks or significant murmurs.  Abdomen is soft.  There is trace peripheral edema. Filed Vitals:   11/21/10 1521  BP: 110/70  Pulse: 84  Resp: 14  Height: 5\' 6"  (1.676 m)  Weight: 271 lb (122.925 kg)    EKG Is not done today.  ASSESSMENT & PLAN

## 2010-11-21 NOTE — Assessment & Plan Note (Signed)
The patient had edema and I push her diuretics.  With high glucose she became intravascularly dry.  She was hospitalized and she is much better.  She's been stable at home but in the past few days has gained some fluid weight.  She did take one dose of furosemide and has diuresed some.  I will try to obtain her most recent outpatient labs.  However I think it is safe for her to take 40 Lasix every other day.

## 2010-11-21 NOTE — Patient Instructions (Signed)
Restart Furosemide 40 mg daily  Your physician recommends that you schedule a follow-up appointment in: 8 weeks

## 2010-11-21 NOTE — Assessment & Plan Note (Signed)
Her renal insufficiency improved.  We will obtain her most recent labs.

## 2010-11-21 NOTE — Assessment & Plan Note (Signed)
Heart rate is controlled.  No change in therapy.

## 2010-11-25 ENCOUNTER — Encounter: Payer: Self-pay | Admitting: Cardiology

## 2011-01-17 ENCOUNTER — Ambulatory Visit (INDEPENDENT_AMBULATORY_CARE_PROVIDER_SITE_OTHER): Payer: BC Managed Care – PPO | Admitting: Cardiology

## 2011-01-17 ENCOUNTER — Encounter: Payer: Self-pay | Admitting: Cardiology

## 2011-01-17 DIAGNOSIS — N289 Disorder of kidney and ureter, unspecified: Secondary | ICD-10-CM

## 2011-01-17 DIAGNOSIS — I10 Essential (primary) hypertension: Secondary | ICD-10-CM

## 2011-01-17 DIAGNOSIS — E8779 Other fluid overload: Secondary | ICD-10-CM

## 2011-01-17 LAB — BASIC METABOLIC PANEL
BUN: 14 mg/dL (ref 6–23)
CO2: 28 mEq/L (ref 19–32)
Calcium: 8.6 mg/dL (ref 8.4–10.5)
Chloride: 100 mEq/L (ref 96–112)
Creatinine, Ser: 1 mg/dL (ref 0.4–1.2)
GFR: 61.93 mL/min (ref >60.00)
Glucose, Bld: 318 mg/dL — ABNORMAL HIGH (ref 70–99)
Potassium: 3.5 mEq/L (ref 3.5–5.1)
Sodium: 136 mEq/L (ref 135–145)

## 2011-01-17 NOTE — Assessment & Plan Note (Signed)
Blood pressure is controlled. No change in therapy. 

## 2011-01-17 NOTE — Patient Instructions (Signed)
Your physician recommends that you schedule a follow-up appointment in: 3 months  Your physician recommends that you return for lab work in: today

## 2011-01-17 NOTE — Assessment & Plan Note (Signed)
Chemistry will be checked today to be sure that she is safe on her current diuretics.

## 2011-01-17 NOTE — Progress Notes (Signed)
HPI Patient is seen today for followup fluid overload and renal dysfunction.  She had been hospitalized on October 30, 2010 and had renal dysfunction.  Her diabetes was controlled and her renal function stabilized.  When I saw her back in the office on November 21, 2010 she was again retaining fluid.  I felt safe to resume her Lasix .  This was done and she is actually feeling well.  Her weight today in the office is 268 pounds, down 3 pounds from her last visit. Allergies  Allergen Reactions  . Codeine     Current Outpatient Prescriptions  Medication Sig Dispense Refill  . 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 amitriptyline (ELAVIL) 25 MG tablet Take 25 mg by mouth at bedtime.        Marland Kitchen aspirin 81 MG tablet Take 81 mg by mouth daily.        Marland Kitchen atorvastatin (LIPITOR) 80 MG tablet Take 80 mg by mouth daily.        . carvedilol (COREG) 25 MG tablet TAKE 1 TABLET BY MOUTH 2 TIMES A DAY  60 tablet  6  . clopidogrel (PLAVIX) 75 MG tablet Take 75 mg by mouth daily.        . fish oil-omega-3 fatty acids 1000 MG capsule Take 1 g by mouth daily.       . furosemide (LASIX) 80 MG tablet Take 2 tablets by mouth Every morning.      . insulin NPH-insulin regular (HUMULIN 70/30) (70-30) 100 UNIT/ML injection as directed.        . isosorbide dinitrate (ISORDIL) 10 MG tablet Take 10 mg by mouth 2 (two) times daily.        . nitroGLYCERIN (NITROSTAT) 0.4 MG SL tablet Place 0.4 mg under the tongue every 5 (five) minutes as needed.        . pregabalin (LYRICA) 100 MG capsule Take 100 mg by mouth 3 (three) times daily.          History   Social History  . Marital Status: Married    Spouse Name: N/A    Number of Children: N/A  . Years of Education: N/A   Occupational History  . Not on file.   Social History Main Topics  . Smoking status: Former Smoker    Quit date: 05/26/1997  . Smokeless tobacco: Not on file  . Alcohol Use: No  . Drug Use: Not on file  . Sexually Active: Not on file     Other Topics Concern  . Not on file   Social History Narrative  . No narrative on file    Family History  Problem Relation Age of Onset  . Coronary artery disease      Past Medical History  Diagnosis Date  . Tachycardia     Sinus tachycardia  . Fluid overload   . Overweight   . DM (diabetes mellitus)   . Dyslipidemia   . SOB (shortness of breath)   . CAD (coronary artery disease)     MI in 2000 - MI  2007 - treated bare metal stent (no nuclear since then as 9/11)  . HTN (hypertension)   . Anemia   . Osteoarthritis   . Sinusitis 1/12  . Ejection fraction     good,echo,2007 / 60-65%, echo, September 19, 2010  . RBBB (right bundle branch block)     Old  . Renal insufficiency     June, 2012,  with relative intravascular dehydration and glucose out of control.  Peak creatinine 2.6    No past surgical history on file.  ROS  Patient denies fever, chills, headache, sweats, rash, change in vision, change in hearing, chest pain, cough, nausea vomiting, urinary symptoms.  All other systems are reviewed and are negative.  PHYSICAL EXAM Patient is stable.  She is oriented to person time and place.  Affect is normal.  Lungs are clear.  Respiratory effort is unlabored.  Cardiac exam reveals S1-S2.  No clicks or significant murmurs.  The abdomen is soft.  She has trace peripheral edema. Filed Vitals:   01/17/11 1348  BP: 134/84  Pulse: 88  Height: 5\' 6"  (1.676 m)  Weight: 268 lb (121.564 kg)    EKG Is not done today.  ASSESSMENT & PLAN

## 2011-01-17 NOTE — Assessment & Plan Note (Signed)
Her fluid status is stable.  Diuretic will be continued.  We will check her chemistry today.

## 2011-01-21 ENCOUNTER — Telehealth: Payer: Self-pay | Admitting: Cardiology

## 2011-01-21 NOTE — Telephone Encounter (Signed)
Patient aware of labs results. Her PCP is following her Glucose levels. She has an appointment tomorrow.

## 2011-01-21 NOTE — Telephone Encounter (Signed)
Pt calling re her blood work results. Pt would like to taLK to a nurse.

## 2011-01-29 ENCOUNTER — Emergency Department (HOSPITAL_COMMUNITY)
Admission: EM | Admit: 2011-01-29 | Discharge: 2011-01-29 | Disposition: A | Discharge status: Home / Self Care | Payer: BC Managed Care – PPO | Attending: Emergency Medicine | Admitting: Emergency Medicine

## 2011-01-29 ENCOUNTER — Emergency Department (HOSPITAL_COMMUNITY): Payer: BC Managed Care – PPO

## 2011-01-29 DIAGNOSIS — Z794 Long term (current) use of insulin: Secondary | ICD-10-CM | POA: Insufficient documentation

## 2011-01-29 DIAGNOSIS — R55 Syncope and collapse: Secondary | ICD-10-CM | POA: Insufficient documentation

## 2011-01-29 DIAGNOSIS — I10 Essential (primary) hypertension: Secondary | ICD-10-CM | POA: Insufficient documentation

## 2011-01-29 DIAGNOSIS — E7800 Pure hypercholesterolemia, unspecified: Secondary | ICD-10-CM | POA: Insufficient documentation

## 2011-01-29 DIAGNOSIS — E1169 Type 2 diabetes mellitus with other specified complication: Secondary | ICD-10-CM | POA: Insufficient documentation

## 2011-01-29 DIAGNOSIS — I252 Old myocardial infarction: Secondary | ICD-10-CM | POA: Insufficient documentation

## 2011-01-29 LAB — GLUCOSE, CAPILLARY
Glucose-Capillary: 74 mg/dL (ref 70–99)
Glucose-Capillary: 180 mg/dL — ABNORMAL HIGH (ref 70–99)
Glucose-Capillary: 93 mg/dL (ref 70–99)

## 2011-01-29 LAB — URINALYSIS, ROUTINE W REFLEX MICROSCOPIC
Bilirubin Urine: NEGATIVE
Glucose, UA: NEGATIVE mg/dL
Hgb urine dipstick: NEGATIVE
Ketones, ur: NEGATIVE mg/dL
Nitrite: NEGATIVE
Protein, ur: 30 mg/dL — AB
Specific Gravity, Urine: 1.023 (ref 1.005–1.030)
Urobilinogen, UA: 1 mg/dL (ref 0.0–1.0)
pH: 5.5 (ref 5.0–8.0)

## 2011-01-29 LAB — DIFFERENTIAL
Basophils Absolute: 0 10*3/uL (ref 0.0–0.1)
Basophils Relative: 0 % (ref 0–1)
Eosinophils Absolute: 0.2 10*3/uL (ref 0.0–0.7)
Eosinophils Relative: 2 % (ref 0–5)
Lymphocytes Relative: 9 % — ABNORMAL LOW (ref 12–46)
Lymphs Abs: 0.9 10*3/uL (ref 0.7–4.0)
Monocytes Absolute: 0.4 10*3/uL (ref 0.1–1.0)
Monocytes Relative: 5 % (ref 3–12)
Neutro Abs: 7.5 10*3/uL (ref 1.7–7.7)
Neutrophils Relative %: 83 % — ABNORMAL HIGH (ref 43–77)

## 2011-01-29 LAB — COMPREHENSIVE METABOLIC PANEL
ALT: 38 U/L — ABNORMAL HIGH (ref 0–35)
AST: 36 U/L (ref 0–37)
Albumin: 3.4 g/dL — ABNORMAL LOW (ref 3.5–5.2)
Alkaline Phosphatase: 143 U/L — ABNORMAL HIGH (ref 39–117)
BUN: 15 mg/dL (ref 6–23)
CO2: 23 mEq/L (ref 19–32)
Calcium: 9.1 mg/dL (ref 8.4–10.5)
Chloride: 104 mEq/L (ref 96–112)
Creatinine, Ser: 0.82 mg/dL (ref 0.50–1.10)
GFR calc Af Amer: >60 mL/min (ref >60)
GFR calc non Af Amer: >60 mL/min (ref >60)
Glucose, Bld: 56 mg/dL — ABNORMAL LOW (ref 70–99)
Potassium: 3.1 mEq/L — ABNORMAL LOW (ref 3.5–5.1)
Sodium: 139 mEq/L (ref 135–145)
Total Bilirubin: 0.2 mg/dL — ABNORMAL LOW (ref 0.3–1.2)
Total Protein: 6.9 g/dL (ref 6.0–8.3)

## 2011-01-29 LAB — CBC
HCT: 38.1 % (ref 36.0–46.0)
Hemoglobin: 11.9 g/dL — ABNORMAL LOW (ref 12.0–15.0)
MCH: 24.3 pg — ABNORMAL LOW (ref 26.0–34.0)
MCHC: 31.2 g/dL (ref 30.0–36.0)
MCV: 77.8 fL — ABNORMAL LOW (ref 78.0–100.0)
Platelets: 269 10*3/uL (ref 150–400)
RBC: 4.9 MIL/uL (ref 3.87–5.11)
RDW: 14.8 % (ref 11.5–15.5)
WBC: 9 10*3/uL (ref 4.0–10.5)

## 2011-01-29 LAB — POCT I-STAT TROPONIN I: Troponin i, poc: 0.01 ng/mL (ref 0.00–0.08)

## 2011-01-29 LAB — URINE MICROSCOPIC-ADD ON

## 2011-01-29 LAB — LIPASE, BLOOD: Lipase: 28 U/L (ref 11–59)

## 2011-03-19 ENCOUNTER — Other Ambulatory Visit: Payer: Self-pay | Admitting: Cardiology

## 2011-03-23 ENCOUNTER — Other Ambulatory Visit: Payer: Self-pay | Admitting: Cardiology

## 2011-04-08 ENCOUNTER — Other Ambulatory Visit: Payer: Self-pay | Admitting: Cardiology

## 2011-04-22 ENCOUNTER — Ambulatory Visit (INDEPENDENT_AMBULATORY_CARE_PROVIDER_SITE_OTHER): Payer: BC Managed Care – PPO | Admitting: Cardiology

## 2011-04-22 ENCOUNTER — Encounter: Payer: Self-pay | Admitting: Cardiology

## 2011-04-22 DIAGNOSIS — I10 Essential (primary) hypertension: Secondary | ICD-10-CM

## 2011-04-22 DIAGNOSIS — I2510 Atherosclerotic heart disease of native coronary artery without angina pectoris: Secondary | ICD-10-CM

## 2011-04-22 DIAGNOSIS — R9389 Abnormal findings on diagnostic imaging of other specified body structures: Secondary | ICD-10-CM | POA: Insufficient documentation

## 2011-04-22 DIAGNOSIS — R000 Tachycardia, unspecified: Secondary | ICD-10-CM

## 2011-04-22 DIAGNOSIS — R918 Other nonspecific abnormal finding of lung field: Secondary | ICD-10-CM
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
## 2011-04-22 NOTE — Assessment & Plan Note (Signed)
Systolic blood pressure is mildly elevated today.  She has not taken all of her medicines today.  I will not change her meds.

## 2011-04-22 NOTE — Assessment & Plan Note (Signed)
Her rhythm is stable.  No change in therapy.

## 2011-04-22 NOTE — Assessment & Plan Note (Signed)
Coronary disease is stable. No change in therapy. 

## 2011-04-22 NOTE — Patient Instructions (Signed)
Your physician wants you to follow-up in: [place] will receive a reminder letter in the mail two months in advance. If you don't receive a letter, please call our office to schedule the follow-up appointment. Your physician recommends that you continue on your current medications as directed. Please refer to the Current Medication list given to you today.

## 2011-04-22 NOTE — Assessment & Plan Note (Signed)
There is question of an abnormality on the chest x-ray in June, 2012.  The patient had a followup x-ray in September, 2012.  That film status that was compared to the film of June, 2012 and that the September film was normal.  It appears that no further workup is needed.  We could consider a followup chest x-ray in one year.

## 2011-05-05 ENCOUNTER — Ambulatory Visit
Admission: RE | Admit: 2011-05-05 | Discharge: 2011-05-05 | Disposition: A | Discharge status: Home / Self Care | Payer: BC Managed Care – PPO | Source: Ambulatory Visit | Attending: Physician Assistant | Admitting: Physician Assistant

## 2011-05-05 ENCOUNTER — Other Ambulatory Visit: Payer: Self-pay | Admitting: Physician Assistant

## 2011-05-05 DIAGNOSIS — J984 Other disorders of lung: Secondary | ICD-10-CM

## 2011-05-12 ENCOUNTER — Other Ambulatory Visit: Payer: Self-pay | Admitting: Cardiology

## 2011-09-11 ENCOUNTER — Other Ambulatory Visit: Payer: Self-pay | Admitting: Obstetrics and Gynecology

## 2011-09-11 DIAGNOSIS — R928 Other abnormal and inconclusive findings on diagnostic imaging of breast: Secondary | ICD-10-CM

## 2011-09-24 ENCOUNTER — Other Ambulatory Visit: Payer: Self-pay | Admitting: Obstetrics and Gynecology

## 2011-09-24 ENCOUNTER — Ambulatory Visit
Admission: RE | Admit: 2011-09-24 | Discharge: 2011-09-24 | Disposition: A | Discharge status: Home / Self Care | Payer: BC Managed Care – PPO | Source: Ambulatory Visit | Attending: Obstetrics and Gynecology | Admitting: Obstetrics and Gynecology

## 2011-09-24 ENCOUNTER — Telehealth: Payer: Self-pay | Admitting: Cardiology

## 2011-09-24 DIAGNOSIS — R921 Mammographic calcification found on diagnostic imaging of breast: Secondary | ICD-10-CM

## 2011-09-24 DIAGNOSIS — R928 Other abnormal and inconclusive findings on diagnostic imaging of breast: Secondary | ICD-10-CM

## 2011-09-24 NOTE — Telephone Encounter (Signed)
Walk In pt Form " [t Dropped Off Handicapped paper to be Completed And Mailed back to home address" sent to Debby L 09/24/11/KM

## 2011-09-29 NOTE — Telephone Encounter (Signed)
Handicap placard mailed out [facility] for PT  09/29/11  TK

## 2011-10-01 ENCOUNTER — Ambulatory Visit: Payer: BC Managed Care – PPO | Admitting: Cardiology

## 2011-10-02 ENCOUNTER — Ambulatory Visit (INDEPENDENT_AMBULATORY_CARE_PROVIDER_SITE_OTHER): Payer: BC Managed Care – PPO | Admitting: Cardiology

## 2011-10-02 ENCOUNTER — Encounter: Payer: Self-pay | Admitting: Cardiology

## 2011-10-02 VITALS — BP 114/78 | HR 88 | Ht 66.0 in | Wt 243.0 lb

## 2011-10-02 DIAGNOSIS — I10 Essential (primary) hypertension: Secondary | ICD-10-CM

## 2011-10-02 DIAGNOSIS — I4510 Unspecified right bundle-branch block: Secondary | ICD-10-CM

## 2011-10-02 DIAGNOSIS — R000 Tachycardia, unspecified: Secondary | ICD-10-CM

## 2011-10-02 DIAGNOSIS — I2510 Atherosclerotic heart disease of native coronary artery without angina pectoris: Secondary | ICD-10-CM

## 2011-10-02 NOTE — Assessment & Plan Note (Signed)
Blood pressures controlled. No change in therapy. 

## 2011-10-02 NOTE — Assessment & Plan Note (Signed)
Volume status is stable. No change in therapy. 

## 2011-10-02 NOTE — Assessment & Plan Note (Signed)
Heart rate is controlled. No change in therapy.
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
## 2011-10-02 NOTE — Patient Instructions (Signed)
Your physician wants you to follow-up in:  6 months. You will receive a reminder letter in the mail two months in advance. If you don't receive a letter, please call our office to schedule the follow-up appointment.   

## 2011-10-13 ENCOUNTER — Ambulatory Visit
Admission: RE | Admit: 2011-10-13 | Discharge: 2011-10-13 | Disposition: A | Discharge status: Home / Self Care | Payer: BC Managed Care – PPO | Source: Ambulatory Visit | Attending: Obstetrics and Gynecology | Admitting: Obstetrics and Gynecology

## 2011-10-13 ENCOUNTER — Other Ambulatory Visit: Payer: Self-pay | Admitting: Obstetrics and Gynecology

## 2011-10-13 ENCOUNTER — Other Ambulatory Visit: Payer: Self-pay | Admitting: Radiology

## 2011-10-13 DIAGNOSIS — R928 Other abnormal and inconclusive findings on diagnostic imaging of breast: Secondary | ICD-10-CM

## 2011-10-13 DIAGNOSIS — R921 Mammographic calcification found on diagnostic imaging of breast: Secondary | ICD-10-CM

## 2011-11-25 ENCOUNTER — Encounter: Payer: Self-pay | Admitting: Cardiology

## 2011-11-27 ENCOUNTER — Other Ambulatory Visit: Payer: Self-pay | Admitting: Cardiology

## 2011-12-02 ENCOUNTER — Other Ambulatory Visit: Payer: Self-pay | Admitting: Cardiology

## 2012-03-22 ENCOUNTER — Other Ambulatory Visit: Payer: Self-pay | Admitting: Cardiology

## 2012-04-13 ENCOUNTER — Other Ambulatory Visit: Payer: Self-pay | Admitting: *Deleted

## 2012-04-13 MED ORDER — ATORVASTATIN CALCIUM 80 MG PO TABS: 80.0000 mg | ORAL_TABLET | Freq: Every day | ORAL | Status: DC

## 2012-04-15 ENCOUNTER — Encounter: Payer: Self-pay | Admitting: Cardiology

## 2012-04-15 ENCOUNTER — Ambulatory Visit (INDEPENDENT_AMBULATORY_CARE_PROVIDER_SITE_OTHER): Payer: BC Managed Care – PPO | Admitting: Cardiology

## 2012-04-15 VITALS — BP 150/96 | HR 81 | Ht 66.0 in | Wt 239.0 lb

## 2012-04-15 DIAGNOSIS — R000 Tachycardia, unspecified: Secondary | ICD-10-CM

## 2012-04-15 DIAGNOSIS — R32 Unspecified urinary incontinence: Secondary | ICD-10-CM | POA: Insufficient documentation

## 2012-04-15 DIAGNOSIS — E8770 Fluid overload, unspecified: Secondary | ICD-10-CM

## 2012-04-15 DIAGNOSIS — E8779 Other fluid overload: Secondary | ICD-10-CM

## 2012-04-15 NOTE — Assessment & Plan Note (Signed)
By history the patient has volume overload and she requires a diuretic. She has not been using her diuretic because of her urinary incontinence. Despite this she is not fluid overloaded. This of course is quite difficult to explain.

## 2012-04-15 NOTE — Progress Notes (Signed)
HPI  Patient is seen to followup fluid overload and tachycardia and hypertension.Her overall cardiac status appears to be stable. She's having a major problem with urinary incontinence. She says she is not able to control her bladder at all. She is not having burning with her urine.  Allergies  Allergen Reactions  . Codeine     Current Outpatient Prescriptions  Medication Sig Dispense Refill  . 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 amitriptyline (ELAVIL) 25 MG tablet Take 50 mg by mouth at bedtime.       Marland Kitchen aspirin 81 MG tablet Take 81 mg by mouth daily.        Marland Kitchen atorvastatin (LIPITOR) 80 MG tablet Take 1 tablet (80 mg total) by mouth daily.  30 tablet  10  . carvedilol (COREG) 25 MG tablet TAKE 1 TABLET BY MOUTH 2 TIMES A DAY  60 tablet  6  . clonazePAM (KLONOPIN) 0.5 MG tablet Take 0.5 mg by mouth as needed.      . clopidogrel (PLAVIX) 75 MG tablet TAKE 1 TABLET BY MOUTH EVERY DAY  90 tablet  2  . FERROUS SULFATE PO Take by mouth 2 (two) times daily.       . fish oil-omega-3 fatty acids 1000 MG capsule Take 1 g by mouth daily.       . furosemide (LASIX) 80 MG tablet Take 2 tablets by mouth Every morning.      . Insulin Isophane Human (NOVOLIN N [place]) Inject into the skin.        . Insulin Regular Human (NOVOLIN R IJ) Inject 25 Units as directed.        . isosorbide dinitrate (ISORDIL) 10 MG tablet TAKE 1 TABLET BY MOUTH TWICE A DAY  60 tablet  8  . losartan (COZAAR) 50 MG tablet Take 1 tablet by mouth Daily.      . metFORMIN (GLUCOPHAGE) 500 MG tablet Take 500 mg by mouth 2 (two) times daily with a meal.        . nitroGLYCERIN (NITROSTAT) 0.4 MG SL tablet Place 0.4 mg under the tongue every 5 (five) minutes as needed.        . pregabalin (LYRICA) 100 MG capsule Take 100 mg by mouth 3 (three) times daily.          History   Social History  . Marital Status: Married    Spouse Name: N/A    Number of Children: N/A  . Years of Education: N/A   Occupational  History  . Not on file.   Social History Main Topics  . Smoking status: Former Smoker    Quit date: 05/26/1997  . Smokeless tobacco: Not on file  . Alcohol Use: No  . Drug Use: Not on file  . Sexually Active: Not on file   Other Topics Concern  . Not on file   Social History Narrative  . No narrative on file    Family History  Problem Relation Age of Onset  . Coronary artery disease      Past Medical History  Diagnosis Date  . Tachycardia     Sinus tachycardia  . Fluid overload   . Overweight   . DM (diabetes mellitus)   . Dyslipidemia   . SOB (shortness of breath)   . CAD (coronary artery disease)     MI in 2000 - MI  2007 - treated bare metal stent (no nuclear since then as 9/11)  .  HTN (hypertension)   . Anemia   . Osteoarthritis   . Sinusitis 1/12  . Ejection fraction     good,echo,2007 / 60-65%, echo, September 19, 2010  . RBBB (right bundle branch block)     Old  . Renal insufficiency     June, 2012, with relative intravascular dehydration and glucose out of control.  Peak creatinine 2.6  . Chest x-ray abnormality     June, 2012, questionable abnormality /  repeat chest x-ray done January 29, 2011,, the film says comparison to October 31, 2010, normal chest x-ray.    No past surgical history on file.  Patient Active Problem List  Diagnosis  . DM  . DYSLIPIDEMIA  . FLUID OVERLOAD  . OVERWEIGHT  . HYPERTENSION  . SHORTNESS OF BREATH  . Tachycardia  . CAD (coronary artery disease)  . RBBB (right bundle branch block)  . Ejection fraction  . Fever blister  . Depression  . Renal insufficiency  . Chest x-ray abnormality    ROS   Patient denies fever, chills, headache, sweats, rash, change in vision, change in hearing, chest pain, cough, nausea vomiting. All other systems are reviewed and are negative.  PHYSICAL EXAM  Patient is overweight but her weight is down 3 or 4 pounds since her last visit. There is no jugulovenous distention. Lungs are clear.  Respiratory effort is nonlabored. Cardiac exam her vitals S1 and S2. There no clicks or significant murmurs. The abdomen is soft. There is no peripheral edema.  Filed Vitals:   04/15/12 1543  BP: 150/96  Pulse: 81  Height: 5\' 6"  (1.676 m)  Weight: 239 lb (108.41 kg)   EKG is done today and reviewed by me. There is old right bundle branch block. There is no significant change.  ASSESSMENT & PLAN

## 2012-04-15 NOTE — Assessment & Plan Note (Signed)
Heart rate is controlled. No change in therapy.

## 2012-04-15 NOTE — Assessment & Plan Note (Signed)
The patient has not look for any medical care concerning this problem. She has marked urinary incontinence. I've encouraged her to try to see her primary physician as soon as possible.

## 2012-04-15 NOTE — Patient Instructions (Addendum)
Your physician wants you to follow-up in: 6 months.  You will receive a reminder letter in the mail two months in advance. If you don't receive a letter, please call our office to schedule the follow-up appointment.  Make an appt with your primary physician for your bladder problem.

## 2012-04-26 ENCOUNTER — Other Ambulatory Visit: Payer: Self-pay | Admitting: Physician Assistant

## 2012-04-26 ENCOUNTER — Ambulatory Visit
Admission: RE | Admit: 2012-04-26 | Discharge: 2012-04-26 | Disposition: A | Discharge status: Home / Self Care | Payer: BC Managed Care – PPO | Source: Ambulatory Visit | Attending: Physician Assistant | Admitting: Physician Assistant

## 2012-04-26 DIAGNOSIS — I639 Cerebral infarction, unspecified: Secondary | ICD-10-CM

## 2012-04-26 DIAGNOSIS — R269 Unspecified abnormalities of gait and mobility: Secondary | ICD-10-CM

## 2012-04-26 MED ORDER — IOHEXOL 300 MG/ML  SOLN
75.0000 mL | Freq: Once | INTRAMUSCULAR | Status: AC | PRN
  Administered 2012-04-26: 75 mL via INTRAVENOUS

## 2012-04-27 ENCOUNTER — Ambulatory Visit
Admission: RE | Admit: 2012-04-27 | Discharge: 2012-04-27 | Disposition: A | Discharge status: Home / Self Care | Payer: BC Managed Care – PPO | Source: Ambulatory Visit | Attending: Physician Assistant | Admitting: Physician Assistant

## 2012-04-27 DIAGNOSIS — I639 Cerebral infarction, unspecified: Secondary | ICD-10-CM

## 2012-04-28 ENCOUNTER — Other Ambulatory Visit: Payer: Self-pay | Admitting: Physician Assistant

## 2012-04-28 DIAGNOSIS — I6350 Cerebral infarction due to unspecified occlusion or stenosis of unspecified cerebral artery: Secondary | ICD-10-CM

## 2012-04-29 ENCOUNTER — Ambulatory Visit
Admission: RE | Admit: 2012-04-29 | Discharge: 2012-04-29 | Disposition: A | Discharge status: Home / Self Care | Payer: BC Managed Care – PPO | Source: Ambulatory Visit | Attending: Physician Assistant | Admitting: Physician Assistant

## 2012-04-29 DIAGNOSIS — I6350 Cerebral infarction due to unspecified occlusion or stenosis of unspecified cerebral artery: Secondary | ICD-10-CM

## 2012-05-03 ENCOUNTER — Ambulatory Visit (HOSPITAL_COMMUNITY): Payer: BC Managed Care – PPO | Attending: Internal Medicine | Admitting: Radiology

## 2012-05-03 DIAGNOSIS — I059 Rheumatic mitral valve disease, unspecified: Secondary | ICD-10-CM | POA: Insufficient documentation

## 2012-05-03 DIAGNOSIS — I319 Disease of pericardium, unspecified: Secondary | ICD-10-CM | POA: Insufficient documentation

## 2012-05-03 DIAGNOSIS — R0609 Other forms of dyspnea: Secondary | ICD-10-CM | POA: Insufficient documentation

## 2012-05-03 DIAGNOSIS — R0989 Other specified symptoms and signs involving the circulatory and respiratory systems: Secondary | ICD-10-CM | POA: Insufficient documentation

## 2012-05-03 DIAGNOSIS — E119 Type 2 diabetes mellitus without complications: Secondary | ICD-10-CM | POA: Insufficient documentation

## 2012-05-03 DIAGNOSIS — I369 Nonrheumatic tricuspid valve disorder, unspecified: Secondary | ICD-10-CM | POA: Insufficient documentation

## 2012-05-03 DIAGNOSIS — I2510 Atherosclerotic heart disease of native coronary artery without angina pectoris: Secondary | ICD-10-CM | POA: Insufficient documentation

## 2012-05-03 DIAGNOSIS — I10 Essential (primary) hypertension: Secondary | ICD-10-CM | POA: Insufficient documentation

## 2012-05-03 DIAGNOSIS — R000 Tachycardia, unspecified: Secondary | ICD-10-CM

## 2012-05-03 NOTE — Progress Notes (Signed)
Echocardiogram performed.  

## 2012-05-04 ENCOUNTER — Encounter (HOSPITAL_COMMUNITY): Payer: Self-pay | Admitting: Internal Medicine

## 2012-06-16 ENCOUNTER — Other Ambulatory Visit: Payer: Self-pay

## 2012-06-16 MED ORDER — CARVEDILOL 25 MG PO TABS: 25.0000 mg | ORAL_TABLET | Freq: Two times a day (BID) | ORAL | Status: DC

## 2012-07-17 ENCOUNTER — Encounter: Payer: Self-pay | Admitting: Physician Assistant

## 2012-08-12 ENCOUNTER — Telehealth: Payer: Self-pay | Admitting: Physician Assistant

## 2012-08-12 NOTE — Telephone Encounter (Signed)
Need approval for controlled medication.

## 2012-08-12 NOTE — Telephone Encounter (Signed)
Approved. #[id number]. One additional refill.

## 2012-08-26 ENCOUNTER — Other Ambulatory Visit: Payer: Self-pay

## 2012-09-03 ENCOUNTER — Ambulatory Visit (INDEPENDENT_AMBULATORY_CARE_PROVIDER_SITE_OTHER): Payer: Commercial Managed Care - PPO | Admitting: Physician Assistant

## 2012-09-03 ENCOUNTER — Encounter: Payer: Self-pay | Admitting: Physician Assistant

## 2012-09-03 VITALS — BP 144/80 | HR 84 | Temp 97.9°F | Resp 20 | Ht 65.0 in | Wt 228.0 lb

## 2012-09-03 DIAGNOSIS — I2510 Atherosclerotic heart disease of native coronary artery without angina pectoris: Secondary | ICD-10-CM

## 2012-09-03 DIAGNOSIS — R32 Unspecified urinary incontinence: Secondary | ICD-10-CM

## 2012-09-03 DIAGNOSIS — J988 Other specified respiratory disorders: Secondary | ICD-10-CM

## 2012-09-03 DIAGNOSIS — E8770 Fluid overload, unspecified: Secondary | ICD-10-CM

## 2012-09-03 DIAGNOSIS — B9689 Other specified bacterial agents as the cause of diseases classified elsewhere: Secondary | ICD-10-CM

## 2012-09-03 DIAGNOSIS — E8779 Other fluid overload: Secondary | ICD-10-CM

## 2012-09-03 DIAGNOSIS — A499 Bacterial infection, unspecified: Secondary | ICD-10-CM

## 2012-09-03 MED ORDER — AZITHROMYCIN 250 MG PO TABS: ORAL_TABLET | ORAL | Status: DC

## 2012-09-03 MED ORDER — FESOTERODINE FUMARATE ER 8 MG PO TB24: 8.0000 mg | ORAL_TABLET | Freq: Every day | ORAL | Status: DC

## 2012-09-03 NOTE — Progress Notes (Signed)
Patient ID: Susan [name] MRN: [id number], DOB: 1951-04-19, 62 y.o. Date of Encounter: @DATE @  Chief Complaint:  Chief Complaint  Patient presents with  . Cough    x 2weeks  keeping up all night    HPI: 62 y.o. year old female  presents with:  1-cough, chest congestion for 2 weeks. Says chest feels congested, has cough but cannot get phlegm out. Has been sleeping in recliner last several nights "because cant breathe" when lying in bed. She is unable to clarify whether this is secondary to nasal congestion, chest congestion. Has not been having increased swelling in legs.  Nose does not feel congested to her. Rarely able to blow anything from nose but when she does, it is thick yellow.   2-OAB/Urinary incontinence; Says that the med I gave before (Detrol LA 4mg ) worked during the day but would wear off at night- did this for the first 2 weeks but now it isnt working at all.    Past Medical History  Diagnosis Date  . Tachycardia     Sinus tachycardia  . Fluid overload   . Overweight   . DM (diabetes mellitus)   . Dyslipidemia   . SOB (shortness of breath)   . CAD (coronary artery disease)     MI in 2000 - MI  2007 - treated bare metal stent (no nuclear since then as 9/11)  . HTN (hypertension)   . Anemia   . Osteoarthritis   . Sinusitis 1/12  . Ejection fraction     good,echo,2007 / 60-65%, echo, September 19, 2010  . RBBB (right bundle branch block)     Old  . Renal insufficiency     June, 2012, with relative intravascular dehydration and glucose out of control.  Peak creatinine 2.6  . Chest x-ray abnormality     June, 2012, questionable abnormality /  repeat chest x-ray done January 29, 2011,, the film says comparison to October 31, 2010, normal chest x-ray.  . Urinary incontinence     November, 2013  . Obesity   . H/O cold sores      Home Meds: Current Outpatient Prescriptions on File Prior to Visit  Medication Sig Dispense Refill  . acyclovir (ZOVIRAX) 200 MG capsule  PRN HSV1 outbreak       . aspirin 81 MG tablet Take 81 mg by mouth daily.        Marland Kitchen atorvastatin (LIPITOR) 80 MG tablet Take 1 tablet (80 mg total) by mouth daily.  30 tablet  10  . carvedilol (COREG) 25 MG tablet Take 1 tablet (25 mg total) by mouth 2 (two) times daily with a meal.  60 tablet  6  . clonazePAM (KLONOPIN) 0.5 MG tablet Take 0.5 mg by mouth as needed.      . clopidogrel (PLAVIX) 75 MG tablet TAKE 1 TABLET BY MOUTH EVERY DAY  90 tablet  2  . FERROUS SULFATE PO Take by mouth 2 (two) times daily.       . Insulin Isophane Human (NOVOLIN N [place]) Inject into the skin.        . Insulin Regular Human (NOVOLIN R IJ) Inject 25 Units as directed.        . isosorbide dinitrate (ISORDIL) 10 MG tablet TAKE 1 TABLET BY MOUTH TWICE A DAY  60 tablet  8  . losartan (COZAAR) 50 MG tablet Take 1 tablet by mouth Daily.      . nitroGLYCERIN (NITROSTAT) 0.4 MG SL tablet Place 0.4 mg under  the tongue every 5 (five) minutes as needed.        Marland Kitchen PARoxetine (PAXIL-CR) 25 MG 24 hr tablet Take 25 mg by mouth every morning. Take this with 37.5 mg for total of 62.5mg       . PARoxetine (PAXIL-CR) 37.5 MG 24 hr tablet Take 37.5 mg by mouth every morning. Take with 25mg  for ttal of 62.5mg       . pregabalin (LYRICA) 100 MG capsule Take 100 mg by mouth 3 (three) times daily.        Marland Kitchen tolterodine (DETROL LA) 4 MG 24 hr capsule Take 4 mg by mouth daily.      Marland Kitchen amitriptyline (ELAVIL) 25 MG tablet Take 50 mg by mouth at bedtime.       . fish oil-omega-3 fatty acids 1000 MG capsule Take 1 g by mouth daily.       . furosemide (LASIX) 80 MG tablet Take 2 tablets by mouth Every morning.      . metFORMIN (GLUCOPHAGE) 500 MG tablet Take 500 mg by mouth 2 (two) times daily with a meal.        . zolpidem (AMBIEN) 10 MG tablet Take 10 mg by mouth at bedtime as needed for sleep.       No current facility-administered medications on file prior to visit.    Allergies:  Allergies  Allergen Reactions  . Codeine Nausea And Vomiting     History   Social History  . Marital Status: Married    Spouse Name: N/A    Number of Children: N/A  . Years of Education: N/A   Occupational History  . Not on file.   Social History Main Topics  . Smoking status: Former Smoker    Quit date: 05/26/1997  . Smokeless tobacco: Not on file  . Alcohol Use: No  . Drug Use: Not on file  . Sexually Active: Not on file   Other Topics Concern  . Not on file   Social History Narrative  . No narrative on file    Family History  Problem Relation Age of Onset  . Coronary artery disease       Review of Systems: Constitutional: negative for chills, fever, night sweats, weight changes, or fatigue  HEENT: negative for vision changes, hearing loss,  rhinorrhea, ST, epistaxis, or sinus pressure Cardiovascular: negative for chest pain or palpitations. No new/increased shortness of breath or dyspnea on exertion Respiratory: negative for hemoptysis, wheezing. See HPI Abdominal: negative for abdominal pain, nausea, vomiting, diarrhea, or constipation Dermatological: negative for rash or concerning skin lesions Neurologic: negative for headache, dizziness, or syncope All other systems reviewed and are otherwise negative with the exception to those above and in the HPI.   Physical Exam: Blood pressure 144/80, pulse 84, temperature 97.9 F (36.6 C), temperature source Oral, resp. rate 20, height 5\' 5"  (1.651 m), weight 228 lb (103.42 kg)., Body mass index is 37.94 kg/(m^2). General: Well developed, well nourished, in no acute distress. Head: Normocephalic, atraumatic, eyes without discharge, sclera non-icteric, nares are without discharge. Bilateral auditory canals clear, TM's are without perforation, pearly grey and translucent with reflective cone of light bilaterally. Oral cavity moist, posterior pharynx without exudate, erythema, peritonsillar abscess, or post nasal drip.  Neck: Supple. No thyromegaly. Full ROM. No  lymphadenopathy. Lungs: Clear bilaterally to auscultation without wheezes, rales, or rhonchi. Breathing is unlabored. Heart: RRR with S1 S2. No murmurs, rubs, or gallops. Abdomen: Soft, non-tender, non-distended with normoactive bowel sounds. No hepatomegaly. No rebound/guarding.  No obvious abdominal masses. Musculoskeletal:  Strength and tone normal for age. Extremities/Skin: Warm and dry. No clubbing or cyanosis. No edema. No rashes or suspicious lesions. Neuro: Alert and oriented X 3. Moves all extremities spontaneously. Gait is normal. CNII-XII grossly in tact. Psych:  Responds to questions appropriately with a normal affect.    ASSESSMENT AND PLAN:  62 y.o. year old female with  1. Bacterial respiratory infection  - azithromycin (ZITHROMAX) 250 MG tablet; On Day 1 Take 2 pills daily. On Days 2-5 take 1 pill daily  Dispense: 6 tablet; Refill: 0 - Pro b natriuretic peptide (BNP)  2.H/O CAD, H/O  Fluid overload Will check BNP to make sure this is infection rather than CHF/Fluid Overload.  - Pro b natriuretic peptide (BNP)  3. Urinary incontinence Detrol LA 4mg  was not strong enough-see HPI - fesoterodine (TOVIAZ) 8 MG TB24; Take 1 tablet (8 mg total) by mouth daily.  Dispense: 30 tablet; Refill: [address] [place], [place], [facility] 09/03/2012 4:43 PM

## 2012-09-07 LAB — PRO B NATRIURETIC PEPTIDE

## 2012-09-16 ENCOUNTER — Ambulatory Visit (INDEPENDENT_AMBULATORY_CARE_PROVIDER_SITE_OTHER): Payer: Commercial Managed Care - PPO | Admitting: Neurology

## 2012-09-16 ENCOUNTER — Encounter: Payer: Self-pay | Admitting: Neurology

## 2012-09-16 VITALS — BP 154/76 | HR 80 | Temp 97.6°F | Ht 67.0 in | Wt 227.0 lb

## 2012-09-16 DIAGNOSIS — R413 Other amnesia: Secondary | ICD-10-CM

## 2012-09-16 DIAGNOSIS — R269 Unspecified abnormalities of gait and mobility: Secondary | ICD-10-CM

## 2012-09-16 DIAGNOSIS — E1149 Type 2 diabetes mellitus with other diabetic neurological complication: Secondary | ICD-10-CM

## 2012-09-16 DIAGNOSIS — I63219 Cerebral infarction due to unspecified occlusion or stenosis of unspecified vertebral arteries: Secondary | ICD-10-CM

## 2012-09-16 MED ORDER — PREGABALIN 150 MG PO CAPS: 150.0000 mg | ORAL_CAPSULE | Freq: Three times a day (TID) | ORAL | Status: DC

## 2012-09-16 NOTE — Patient Instructions (Addendum)
She was advised on aspirin and Plavix for stroke prevention and maintain strict control of diabetes with hemoglobin A1c goal below 6.5% and lipids and LDL cholesterol goal below 70 mg percent. Check MRA of the neck for vertebrobasilar disease and vitamin B12, TSH and RPR for treatable causes of memory loss. Referred to physical therapy for gait and balance training. Continue Paxil for depression and followup with a primary physician for the same. Followup with Dr. Arnell Sieving in the future as needed. No followup appointment is necessary with me.

## 2012-09-17 LAB — TSH: TSH: 3.25 u[IU]/mL (ref 0.450–4.500)

## 2012-09-17 LAB — VITAMIN B12: Vitamin B-12: 400 pg/mL (ref 211–946)

## 2012-09-17 LAB — RPR: RPR: NONREACTIVE

## 2012-09-19 NOTE — Progress Notes (Signed)
[facility] [address] [place]. [place] [address] [phone]       OFFICE CONSULT NOTE  [name] Date of Birth:  July 17, 1950 Medical Record Number:  [id number]   Referring MD:  Susan Age, MD  Reason for Referral:  Second opinion for stroke  HPI: Ms Susan [name] is a 82 alert lady who developed episode of sudden onset of dizziness, vertigo and blacked out and fell in December 2013. She was out only briefly but when she woke up she noted that should not walk straight and was walking as if she was drunk. She did not seek immediate help but subsequently had outpatient CT scan which showed a remote [name] right cerebellar infarct but this appeared to be new compared with previous scan from 2005. MRA of the brain showed no flow in the intracranial right vertebral artery suggestive of possible occlusion. There were moderate branch vessel atherosclerotic changes in the posterior circulation. Carotid ultrasound on 04/27/13 showed less than 50% stenosis bilaterally. She does have multiple vascular risk factors of coronary artery disease status post myocardial infarction in 2000 and 2007 with stent placement both times, diabetes, hyperlipidemia. 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 She has paresthesias in her feet and has been taking Lyrica 100 mg 3 times a day with only partial relief but no significant side effects. She was scheduled for outpatient polysomnogram by Dr. Rexene Alberts but this study has not yet been done. Dr. Rexene Alberts also ordered MRI brain and MRA of the neck but for unclear reason this has not been done as well. She denies any other prior history of strokes, TIAs oral significant aortic or problems. She also has complaints of memory loss and confusion but does admit she has underlying anxeity and  depression for which she takes Paxil but this may be suboptimally treated. ROS:   14 system review of systems is positive for fatigue, urination problems, easy bruising, easy bleeding, increased thirst, cramps, memory loss, confusion, headache, numbness, slurred speech, dizziness, passing out, anxiety, depression and restless legs.  PMH:  Past Medical History  Diagnosis Date  . Tachycardia     Sinus tachycardia  . Fluid overload   . Overweight   . DM (diabetes mellitus)   . Dyslipidemia   . SOB (shortness of breath)   . CAD (coronary artery disease)     MI in 2000 - MI  2007 - treated bare metal stent (no nuclear since then as 9/11)  . HTN (hypertension)   . Anemia   . Osteoarthritis   . Sinusitis 1/12  . Ejection fraction     good,echo,2007 / 60-65%, echo, September 19, 2010  . RBBB (right bundle branch block)     Old  . Renal insufficiency     June, 2012, with relative intravascular dehydration and glucose out of control.  Peak creatinine 2.6  . Chest x-ray abnormality     June, 2012, questionable abnormality /  repeat chest x-ray done January 29, 2011,, the film says comparison to October 31, 2010, normal chest x-ray.  . Urinary incontinence     November, 2013  . Obesity   . H/O cold sores   . Anxiety and depression     Social History:  History   Social History  . Marital Status: Married    Spouse Name: N/A  Number of Children: 3  . Years of Education: 12th   Occupational History  .      unemploye   Social History Main Topics  . Smoking status: Former Smoker    Quit date: 05/26/1997  . Smokeless tobacco: Not on file  . Alcohol Use: No  . Drug Use: No  . Sexually Active: Not on file   Other Topics Concern  . Not on file   Social History Narrative   Pt lives at home with her spouse.   Caffeine Use- 3 sodas daily    Medications:   Current Outpatient Prescriptions on File Prior to Visit  Medication Sig Dispense Refill  . acyclovir (ZOVIRAX) 200 MG capsule  PRN HSV1 outbreak       . aspirin 81 MG tablet Take 81 mg by mouth daily.        Marland Kitchen atorvastatin (LIPITOR) 80 MG tablet Take 1 tablet (80 mg total) by mouth daily.  30 tablet  10  . carvedilol (COREG) 25 MG tablet Take 1 tablet (25 mg total) by mouth 2 (two) times daily with a meal.  60 tablet  6  . clonazePAM (KLONOPIN) 0.5 MG tablet Take 0.5 mg by mouth as needed.      . clopidogrel (PLAVIX) 75 MG tablet TAKE 1 TABLET BY MOUTH EVERY DAY  90 tablet  2  . dextromethorphan-guaiFENesin (MUCINEX DM) 30-600 MG per 12 hr tablet Take 1 tablet by mouth every 12 (twelve) hours.      [name] SULFATE PO Take by mouth 2 (two) times daily.       . fesoterodine (TOVIAZ) 8 MG TB24 Take 1 tablet (8 mg total) by mouth daily.  30 tablet  5  . fish oil-omega-3 fatty acids 1000 MG capsule Take 1 g by mouth daily.       . furosemide (LASIX) 80 MG tablet Take 2 tablets by mouth Every morning.      . Insulin Isophane Human (NOVOLIN N [place]) Inject into the skin.        . Insulin Regular Human (NOVOLIN R IJ) Inject 25 Units as directed.        . isosorbide dinitrate (ISORDIL) 10 MG tablet TAKE 1 TABLET BY MOUTH TWICE A DAY  60 tablet  8  . losartan (COZAAR) 50 MG tablet Take 1 tablet by mouth Daily.      . nitroGLYCERIN (NITROSTAT) 0.4 MG SL tablet Place 0.4 mg under the tongue every 5 (five) minutes as needed.        Marland Kitchen PARoxetine (PAXIL-CR) 37.5 MG 24 hr tablet Take 37.5 mg by mouth every morning. Take with 25mg  for ttal of 62.5mg       . tolterodine (DETROL LA) 4 MG 24 hr capsule Take 4 mg by mouth daily.       No current facility-administered medications on file prior to visit.    Allergies:   Allergies  Allergen Reactions  . Codeine Nausea And Vomiting    Physical Exam General: well developed, well nourished, seated, in no evident distress Head: head normocephalic and atraumatic. Orohparynx benign Neck: supple with no carotid or supraclavicular bruits Cardiovascular: regular rate and rhythm, no  murmurs Musculoskeletal: no deformity Skin:  no rash/petichiae Vascular:  Normal pulses all extremities  Neurologic Exam Mental Status: Awake and fully alert. Oriented to place and time. Recent and remote memory intact. Attention span, concentration and fund of knowledge appropriate. Mood and affect appropriate. MMSE 22/30 with deficits in orientation,recall,comprehension and drawing.Animal Naming Test  10.Clock Drawing 3/4. Cranial Nerves: Fundoscopic exam reveals sharp disc margins. Pupils equal, briskly reactive to light. Extraocular movements full without nystagmus. Visual fields full to confrontation. Hearing intact. Facial sensation intact. Face, tongue, palate moves normally and symmetrically.  Motor: Normal bulk and tone. Normal strength in all tested extremity muscles.mild ankle dorsiflexor weakness bilaterally. Sensory.: intact to touch and pinprick and vibratory in upper extremities but all modalities diminished in stocking distribution in lower extremities.Marland Kitchen Positive Rhomberg`s sign Coordination: Rapid alternating movements normal in all extremities. Finger-to-nose and heel-to-shin performed accurately bilaterally. Gait and Station: Arises from chair without difficulty. Stance is broad based. Gait demonstrates mild ataxia. Not a except ankle and knee jerks are depressed bilaterallyble to heel, toe and tandem walk without difficulty.  Reflexes: 1+ and symmetric. Toes downgoing.     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 Multiple vascular risk factors of diabetes, hypertension, hyperlipidemia and suspected sleep apnea. She has long-standing gait and balance difficulties which are likely multifactorial from combination off for cerebellar stroke as well as diabetic sensory peripheral neuropathy. 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 Strict control of diabetes with hemoglobin A1c goal below 6.5%, hypertension with blood pressure goal below 120/80 and lipids with LDL cholesterol goal below 70 mg percent. Check MRA of the neck with and without contrast to evaluate her extracranial vertebral arteries.Referral to physical therapy for gait and balance as well as check labs for treatable causes of cognitive impairment the Keep her appointment for polysomnogram for sleep apnea. Increase Lyrica dose to 150 mg 3 times a day for her lower extremity paresthesias. I advised her to follow up with her primary physician for more optimal treatment for her depression. Follow up with Dr. Rexene Alberts as needed.. No followup appointment was made with me.

## 2012-10-01 ENCOUNTER — Ambulatory Visit
Admission: RE | Admit: 2012-10-01 | Discharge: 2012-10-01 | Disposition: A | Discharge status: Home / Self Care | Payer: Commercial Managed Care - PPO | Source: Ambulatory Visit | Attending: Physician Assistant | Admitting: Physician Assistant

## 2012-10-01 ENCOUNTER — Encounter: Payer: Self-pay | Admitting: Physician Assistant

## 2012-10-01 ENCOUNTER — Ambulatory Visit (INDEPENDENT_AMBULATORY_CARE_PROVIDER_SITE_OTHER): Payer: Commercial Managed Care - PPO | Admitting: Physician Assistant

## 2012-10-01 VITALS — BP 148/80 | HR 78 | Temp 97.0°F | Resp 24 | Ht 64.0 in | Wt 231.0 lb

## 2012-10-01 DIAGNOSIS — M79604 Pain in right leg: Secondary | ICD-10-CM

## 2012-10-01 DIAGNOSIS — R32 Unspecified urinary incontinence: Secondary | ICD-10-CM

## 2012-10-01 DIAGNOSIS — R600 Localized edema: Secondary | ICD-10-CM

## 2012-10-01 DIAGNOSIS — R609 Edema, unspecified: Secondary | ICD-10-CM

## 2012-10-01 DIAGNOSIS — M79609 Pain in unspecified limb: Secondary | ICD-10-CM

## 2012-10-01 NOTE — Progress Notes (Signed)
Patient ID: Susan [name] MRN: [id number], DOB: 03-25-1951, 62 y.o. Date of Encounter: @DATE @  Chief Complaint:  Chief Complaint  Patient presents with  . Leg Swelling    X2 wkks    HPI: 63 y.o. year old female  Reports that she fell 3 weeks ago. Was planting some flowers and lost her balance and fell. Her right lower leg has been very bruised and swollen. She has had no medicla eval of this until now. Went to pharmacy and was asking pharmacist what med to use for her leg and they told her to be evaluated for blood clot etc so she is here.  She states that the deep bruising has actually faded and gotten some better. Also reprts that the swelling is actually improved also. Some pain in leg but this also is improved. No pleuritic chest pain. No chest pain at all. No shortness of breath.  Also, she states that the medication I gave her for urinary incontinece is not working and is expensive.    Past Medical History  Diagnosis Date  . Tachycardia     Sinus tachycardia  . Fluid overload   . Overweight   . DM (diabetes mellitus)   . Dyslipidemia   . SOB (shortness of breath)   . CAD (coronary artery disease)     MI in 2000 - MI  2007 - treated bare metal stent (no nuclear since then as 9/11)  . HTN (hypertension)   . Anemia   . Osteoarthritis   . Sinusitis 1/12  . Ejection fraction     good,echo,2007 / 60-65%, echo, September 19, 2010  . RBBB (right bundle branch block)     Old  . Renal insufficiency     June, 2012, with relative intravascular dehydration and glucose out of control.  Peak creatinine 2.6  . Chest x-ray abnormality     June, 2012, questionable abnormality /  repeat chest x-ray done January 29, 2011,, the film says comparison to October 31, 2010, normal chest x-ray.  . Urinary incontinence     November, 2013  . Obesity   . H/O cold sores   . Anxiety and depression      Home Meds: See attached medication section for current medication list. Any medications entered  into computer today will not appear on this note's list. The medications listed below were entered prior to today. Current Outpatient Prescriptions on File Prior to Visit  Medication Sig Dispense Refill  . 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 aspirin 81 MG tablet Take 81 mg by mouth daily.        Marland Kitchen atorvastatin (LIPITOR) 80 MG tablet Take 1 tablet (80 mg total) by mouth daily.  30 tablet  10  . carvedilol (COREG) 25 MG tablet Take 1 tablet (25 mg total) by mouth 2 (two) times daily with a meal.  60 tablet  6  . clonazePAM (KLONOPIN) 0.5 MG tablet Take 0.5 mg by mouth as needed.      . clopidogrel (PLAVIX) 75 MG tablet TAKE 1 TABLET BY MOUTH EVERY DAY  90 tablet  2  . dextromethorphan-guaiFENesin (MUCINEX DM) 30-600 MG per 12 hr tablet Take 1 tablet by mouth every 12 (twelve) hours.      [name] SULFATE PO Take by mouth 2 (two) times daily.       . fesoterodine (TOVIAZ) 8 MG TB24 Take 1 tablet (8 mg total) by mouth daily.  30 tablet  5  . fish oil-omega-3 fatty acids 1000 MG capsule Take 1 g by mouth daily.       . furosemide (LASIX) 80 MG tablet Take 2 tablets by mouth Every morning.      . Insulin Isophane Human (NOVOLIN N [place]) Inject into the skin.        . Insulin Regular Human (NOVOLIN R IJ) Inject 25 Units as directed.        . isosorbide dinitrate (ISORDIL) 10 MG tablet TAKE 1 TABLET BY MOUTH TWICE A DAY  60 tablet  8  . losartan (COZAAR) 50 MG tablet Take 1 tablet by mouth Daily.      . nitroGLYCERIN (NITROSTAT) 0.4 MG SL tablet Place 0.4 mg under the tongue every 5 (five) minutes as needed.        Marland Kitchen PARoxetine (PAXIL-CR) 37.5 MG 24 hr tablet Take 37.5 mg by mouth every morning. Take with 25mg  for ttal of 62.5mg       . pregabalin (LYRICA) 150 MG capsule Take 1 capsule (150 mg total) by mouth 3 (three) times daily.  90 capsule  5  . tolterodine (DETROL LA) 4 MG 24 hr capsule Take 4 mg by mouth daily.       No current facility-administered medications on file  prior to visit.    Allergies:  Allergies  Allergen Reactions  . Codeine Nausea And Vomiting    History   Social History  . Marital Status: Married    Spouse Name: N/A    Number of Children: 3  . Years of Education: 12th   Occupational History  .      unemploye   Social History Main Topics  . Smoking status: Former Smoker    Quit date: 05/26/1997  . Smokeless tobacco: Not on file  . Alcohol Use: No  . Drug Use: No  . Sexually Active: Not on file   Other Topics Concern  . Not on file   Social History Narrative   Pt lives at home with her spouse.   Caffeine Use- 3 sodas daily    Family History  Problem Relation Age of Onset  . Coronary artery disease    . Heart attack Mother      Review of Systems:  See HPI for pertinent ROS. All other ROS negative.    Physical Exam: Blood pressure 148/80, pulse 78, temperature 97 F (36.1 C), temperature source Oral, resp. rate 24, height 5\' 4"  (1.626 m), weight 231 lb (104.781 kg)., Body mass index is 39.63 kg/(m^2). General:Obese WF.  Appears in no acute distress. Lungs: Clear bilaterally to auscultation without wheezes, rales, or rhonchi. Breathing is unlabored. Heart: RRR with S1 S2. No murmurs, rubs, or gallops. Extremities/Skin: Right Lower Extremity; From the knee down, there is swelling. Right lower leg larger than the left. There is deep purple ecchymosis mottled, mostly at lateral aspect of the lower leg. No palpable cord. Mild tenderness with palpation of the lower leg.  Neuro: Alert and oriented X 3. Moves all extremities spontaneously.  Psych:  Responds to questions appropriately with a normal affect.     ASSESSMENT AND PLAN:  62 y.o. year old female with  1. Right leg pain - DG Tibia/Fibula Right; Future - DG Foot Complete Right; Future - US Venous Img Lower Unilateral Right; Future  2. Edema of right lower extremity - US Venous Img Lower Unilateral Right; Future  Will Obtain Ultrasound to R/O DVT. Will  obtain XRays to r/o fractures. Will f/u with her  as soon as test results available.   3. Urinary incontinence At her OV 06/2012 she reported urinary frequency-having to go to bathroom every 15 minutes. Reported urinating 6-7 times a night and still waking up with her Depends soaking wet. Will refer to urology for evaluation.  - Ambulatory referral to Urology   Signed, [place], [place], [facility] 10/01/2012 3:58 PM

## 2012-10-07 ENCOUNTER — Telehealth: Payer: Self-pay | Admitting: Neurology

## 2012-10-07 NOTE — Telephone Encounter (Signed)
Called the patient to follow up on Dr. Guadelupe Sabin recommendation that she have a sleep study.  Reviewed the patients insurance coverage information including deductible and expense associated with the sleep study.  Patient has decided not to proceed with a sleep study at this time because of her high deductible plan...kl

## 2012-10-12 ENCOUNTER — Other Ambulatory Visit: Payer: Self-pay

## 2012-10-12 ENCOUNTER — Ambulatory Visit: Payer: Self-pay | Admitting: Neurology

## 2012-10-12 MED ORDER — ISOSORBIDE DINITRATE 10 MG PO TABS: 10.0000 mg | ORAL_TABLET | Freq: Two times a day (BID) | ORAL | Status: DC

## 2012-10-12 NOTE — Telephone Encounter (Signed)
isosorbide dinitrate (ISORDIL) 10 MG tablet  TAKE 1 TABLET BY MOUTH TWICE A DAY   60 tablet   8    Patient Instructions  Your physician wants you to follow-up in: 6 months.  You will receive a reminder letter in the mail two months in advance. If you don't receive a letter, please call our office to schedule the follow-up appointment.   Make an appt with your primary physician for your bladder problem. Patient Instructions History Recorded     Previous Visit     Provider Department Encounter #  04/13/2012  3:15 PM Dola Argyle, MD [place] [id number]

## 2012-10-15 ENCOUNTER — Telehealth: Payer: Self-pay

## 2012-10-15 NOTE — Telephone Encounter (Signed)
Got refill requests for Coreg and Imdur so called the pharmacy and they stated they did have the approved refill for Imdur sent on the 20th and are going to refill it and her coreg has 3 more refills remaining on it.

## 2012-10-19 ENCOUNTER — Ambulatory Visit
Admission: RE | Admit: 2012-10-19 | Discharge: 2012-10-19 | Disposition: A | Discharge status: Home / Self Care | Payer: Commercial Managed Care - PPO | Source: Ambulatory Visit | Attending: Physician Assistant | Admitting: Physician Assistant

## 2012-10-19 DIAGNOSIS — R600 Localized edema: Secondary | ICD-10-CM

## 2012-10-19 DIAGNOSIS — M79604 Pain in right leg: Secondary | ICD-10-CM

## 2012-10-27 ENCOUNTER — Inpatient Hospital Stay: Admission: RE | Admit: 2012-10-27 | Payer: Commercial Managed Care - PPO | Source: Ambulatory Visit

## 2012-11-03 ENCOUNTER — Other Ambulatory Visit: Payer: Self-pay | Admitting: Family Medicine

## 2012-12-07 ENCOUNTER — Telehealth: Payer: Self-pay | Admitting: Physician Assistant

## 2012-12-08 MED ORDER — PAROXETINE HCL ER 25 MG PO TB24: 25.0000 mg | ORAL_TABLET | ORAL | Status: DC

## 2012-12-08 NOTE — Telephone Encounter (Signed)
Medication refilled per protocol. 

## 2012-12-13 ENCOUNTER — Ambulatory Visit (INDEPENDENT_AMBULATORY_CARE_PROVIDER_SITE_OTHER): Payer: Commercial Managed Care - PPO | Admitting: Physician Assistant

## 2012-12-13 VITALS — BP 134/86 | HR 88 | Temp 97.3°F | Resp 18 | Ht 65.0 in | Wt 223.0 lb

## 2012-12-13 DIAGNOSIS — A084 Viral intestinal infection, unspecified: Secondary | ICD-10-CM

## 2012-12-13 DIAGNOSIS — A088 Other specified intestinal infections: Secondary | ICD-10-CM

## 2012-12-13 MED ORDER — CLONAZEPAM 0.5 MG PO TABS: 0.5000 mg | ORAL_TABLET | Freq: Three times a day (TID) | ORAL | Status: DC | PRN

## 2012-12-14 NOTE — Progress Notes (Signed)
Patient ID: Susan [name] MRN: [id number], DOB: 09/07/1950, 62 y.o. Date of Encounter: 12/14/2012, 11:01 AM    Chief Complaint:  Chief Complaint  Patient presents with  . sores all over tongue,  no energy     HPI: 62 y.o. year old female came in to evaluate bumps on her tongue. Say over the past week she has also had diarrhea and "no energy." Slept a lot past 2 days. She can feel bumps on her tongue. They are not painful. Has not noticed any other bumps in mouth or elsewhere on skin. Has had no fever, chills. No sore throat.  No abdominal pain. Vomited once on first day of illness. No vomiting since.No other symptoms.    Home Meds: See attached medication section for any medications that were entered at today's visit. The computer does not put those onto this list.The following list is a list of meds entered prior to today's visit.   Current Outpatient Prescriptions on File Prior to Visit  Medication Sig Dispense Refill  . 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 aspirin 81 MG tablet Take 81 mg by mouth daily.        Marland Kitchen atorvastatin (LIPITOR) 80 MG tablet Take 1 tablet (80 mg total) by mouth daily.  30 tablet  10  . carvedilol (COREG) 25 MG tablet Take 1 tablet (25 mg total) by mouth 2 (two) times daily with a meal.  60 tablet  6  . clopidogrel (PLAVIX) 75 MG tablet TAKE 1 TABLET BY MOUTH EVERY DAY  90 tablet  2  . Insulin Isophane Human (NOVOLIN N [place]) Inject into the skin.        . Insulin Regular Human (NOVOLIN R IJ) Inject 25 Units as directed.        . isosorbide dinitrate (ISORDIL) 10 MG tablet Take 1 tablet (10 mg total) by mouth 2 (two) times daily.  60 tablet  5  . losartan (COZAAR) 50 MG tablet TAKE 1 TABLET BY MOUTH DAILY  30 tablet  5  . nitroGLYCERIN (NITROSTAT) 0.4 MG SL tablet Place 0.4 mg under the tongue every 5 (five) minutes as needed.        Marland Kitchen PARoxetine (PAXIL CR) 25 MG 24 hr tablet Take 1 tablet (25 mg total) by mouth every morning.  30 tablet  3   . PARoxetine (PAXIL-CR) 37.5 MG 24 hr tablet Take 37.5 mg by mouth every morning. Take with 25mg  for ttal of 62.5mg       . pregabalin (LYRICA) 150 MG capsule Take 1 capsule (150 mg total) by mouth 3 (three) times daily.  90 capsule  5  . dextromethorphan-guaiFENesin (MUCINEX DM) 30-600 MG per 12 hr tablet Take 1 tablet by mouth every 12 (twelve) hours.      [name] SULFATE PO Take by mouth 2 (two) times daily.       . fesoterodine (TOVIAZ) 8 MG TB24 Take 1 tablet (8 mg total) by mouth daily.  30 tablet  5  . fish oil-omega-3 fatty acids 1000 MG capsule Take 1 g by mouth daily.       . furosemide (LASIX) 80 MG tablet Take 2 tablets by mouth Every morning.      . tolterodine (DETROL LA) 4 MG 24 hr capsule Take 4 mg by mouth daily.       No current facility-administered medications on file prior to visit.    Allergies:  Allergies  Allergen Reactions  .  Codeine Nausea And Vomiting      Review of Systems: See HPI for pertinent ROS. All other ROS negative.    Physical Exam: Blood pressure 134/86, pulse 88, temperature 97.3 F (36.3 C), temperature source Oral, resp. rate 18, height 5\' 5"  (1.651 m), weight 223 lb (101.152 kg)., Body mass index is 37.11 kg/(m^2). General: WNWD WF.  Appears in no acute distress. HEENT: Normocephalic, atraumatic, eyes without discharge, sclera non-icteric, nares are without discharge. Bilateral auditory canals clear, TM's are without perforation, pearly grey and translucent with reflective cone of light bilaterally. Oral cavity moist, posterior pharynx without exudate, erythema, peritonsillar abscess, or post nasal drip. Tongue: Posterior half of tongue with small papules, the same color as remainder of tongue. No lesions on oral mucosa orgums. Tonsils and posterior pharynx normal with no erythema or exudate.   Neck: Supple. No thyromegaly. No lymphadenopathy. Lungs: Clear bilaterally to auscultation without wheezes, rales, or rhonchi. Breathing is  unlabored. Heart: Regular rhythm. No murmurs, rubs, or gallops. Abdomen: Soft, non-tender, non-distended with normoactive bowel sounds. No hepatomegaly. No rebound/guarding. No obvious abdominal masses.No area of tenerness with palpation of abdomen.  Msk:  Strength and tone normal for age. Neuro: Alert and oriented X 3. Moves all extremities spontaneously.  Psych:  Responds to questions appropriately with a normal affect.     ASSESSMENT AND PLAN:  62 y.o. year old female with  1. Viral gastroenteritis Reassure her that bumps on tongue secondary to viral illness. Should spontaneously resolve over next 1-2 weeks. If symptoms worsen or persist > 2 weeks, f/u.   Signed, [address] [place], [place], [facility] 12/14/2012 11:01 AM

## 2012-12-15 ENCOUNTER — Other Ambulatory Visit: Payer: Self-pay | Admitting: Cardiology

## 2012-12-22 ENCOUNTER — Encounter: Payer: Self-pay | Admitting: Physician Assistant

## 2012-12-22 ENCOUNTER — Ambulatory Visit (INDEPENDENT_AMBULATORY_CARE_PROVIDER_SITE_OTHER): Payer: Commercial Managed Care - PPO | Admitting: Physician Assistant

## 2012-12-22 VITALS — BP 94/62 | HR 88 | Temp 97.6°F | Resp 16

## 2012-12-22 DIAGNOSIS — R5381 Other malaise: Secondary | ICD-10-CM

## 2012-12-22 DIAGNOSIS — R5383 Other fatigue: Secondary | ICD-10-CM

## 2012-12-22 DIAGNOSIS — R531 Weakness: Secondary | ICD-10-CM

## 2012-12-22 DIAGNOSIS — R1310 Dysphagia, unspecified: Secondary | ICD-10-CM

## 2012-12-22 LAB — CBC W/MCH & 3 PART DIFF
HCT: 44.7 % (ref 36.0–46.0)
Hemoglobin: 16.5 g/dL — ABNORMAL HIGH (ref 12.0–15.0)
Lymphocytes Relative: 13 % (ref 12–46)
Lymphs Abs: 1.2 10*3/uL (ref 0.7–4.0)
MCH: 27.8 pg (ref 26.0–34.0)
MCHC: 36.9 g/dL — ABNORMAL HIGH (ref 30.0–36.0)
MCV: 75.4 fL — ABNORMAL LOW (ref 78.0–100.0)
Neutro Abs: 7.8 10*3/uL — ABNORMAL HIGH (ref 1.7–7.7)
Neutrophils Relative %: 79 % — ABNORMAL HIGH (ref 43–77)
Platelets: 368 10*3/uL (ref 150–400)
RBC: 5.93 MIL/uL — ABNORMAL HIGH (ref 3.87–5.11)
RDW: 15.5 % (ref 11.5–15.5)
WBC mixed population %: 9 % (ref 3–18)
WBC mixed population: 0.9 10*3/uL (ref 0.1–1.8)
WBC: 9.9 10*3/uL (ref 4.0–10.5)

## 2012-12-22 NOTE — Progress Notes (Signed)
Patient ID: Susan [name] MRN: [id number], DOB: [birth year], 62 y.o. Date of Encounter: @DATE @  Chief Complaint:  Chief Complaint  Patient presents with  . still sick from last week no better    tongue is raw and she can not eat    HPI: 62 y.o. year old white female  presents with c/o weakness, fatigue, dysphagia.   I last saw her 12/13/12. At that time she c/o diarrhea and fatigue. Was felt to have viral gastroenteritis.  Today she reports that the diarrhea has resolved. Has had no diarrhea for past 7-8 days. Is having bowel movements regularly and they are normal now.  Has had no abdominal pain, no nausea, no vomiting.   Says food is getting stuck in her throat-started all the sudden last Friday-July 25th. Was eating a biscuit and it leterally got stuck. Since then, has been drinking liquids. Has no problem swallowing liquids.  Has had no other new areas of weakness in body to suggest any recurrent stroke. No slurred speech, no extremity weakness, etc.   No fever, chills.   Past Medical History  Diagnosis Date  . Tachycardia     Sinus tachycardia  . Fluid overload   . Overweight(278.02)   . DM (diabetes mellitus)   . Dyslipidemia   . SOB (shortness of breath)   . CAD (coronary artery disease)     MI in 2000 - MI  2007 - treated bare metal stent (no nuclear since then as 9/11)  . HTN (hypertension)   . Anemia   . Osteoarthritis   . Sinusitis 1/12  . Ejection fraction     good,echo,2007 / 60-65%, echo, September 19, 2010  . RBBB (right bundle branch block)     Old  . Renal insufficiency     June, 2012, with relative intravascular dehydration and glucose out of control.  Peak creatinine 2.6  . Chest x-ray abnormality     June, 2012, questionable abnormality /  repeat chest x-ray done January 29, 2011,, the film says comparison to October 31, 2010, normal chest x-ray.  . Urinary incontinence     November, 2013  . Obesity   . H/O cold sores   . Anxiety and depression       Home Meds: See attached medication section for current medication list. Any medications entered into computer today will not appear on this note's list. The medications listed below were entered prior to today. Current Outpatient Prescriptions on File Prior to Visit  Medication Sig Dispense Refill  . 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 aspirin 81 MG tablet Take 81 mg by mouth daily.        Marland Kitchen atorvastatin (LIPITOR) 80 MG tablet Take 1 tablet (80 mg total) by mouth daily.  30 tablet  10  . carvedilol (COREG) 25 MG tablet Take 1 tablet (25 mg total) by mouth 2 (two) times daily with a meal.  60 tablet  6  . clonazePAM (KLONOPIN) 0.5 MG tablet Take 1 tablet (0.5 mg total) by mouth 3 (three) times daily as needed.  90 tablet  1  . clopidogrel (PLAVIX) 75 MG tablet TAKE 1 TABLET BY MOUTH EVERY DAY  90 tablet  1  . dextromethorphan-guaiFENesin (MUCINEX DM) 30-600 MG per 12 hr tablet Take 1 tablet by mouth every 12 (twelve) hours.      [name] SULFATE PO Take by mouth 2 (two) times daily.       Marland Kitchen  fesoterodine (TOVIAZ) 8 MG TB24 Take 1 tablet (8 mg total) by mouth daily.  30 tablet  5  . fish oil-omega-3 fatty acids 1000 MG capsule Take 1 g by mouth daily.       . furosemide (LASIX) 80 MG tablet Take 2 tablets by mouth Every morning.      . Insulin Isophane Human (NOVOLIN N [place]) Inject into the skin.        . Insulin Regular Human (NOVOLIN R IJ) Inject 25 Units as directed.        . isosorbide dinitrate (ISORDIL) 10 MG tablet Take 1 tablet (10 mg total) by mouth 2 (two) times daily.  60 tablet  5  . losartan (COZAAR) 50 MG tablet TAKE 1 TABLET BY MOUTH DAILY  30 tablet  5  . nitroGLYCERIN (NITROSTAT) 0.4 MG SL tablet Place 0.4 mg under the tongue every 5 (five) minutes as needed.        Marland Kitchen PARoxetine (PAXIL CR) 25 MG 24 hr tablet Take 1 tablet (25 mg total) by mouth every morning.  30 tablet  3  . PARoxetine (PAXIL-CR) 37.5 MG 24 hr tablet Take 37.5 mg by mouth every morning.  Take with 25mg  for ttal of 62.5mg       . pregabalin (LYRICA) 150 MG capsule Take 1 capsule (150 mg total) by mouth 3 (three) times daily.  90 capsule  5  . tolterodine (DETROL LA) 4 MG 24 hr capsule Take 4 mg by mouth daily.       No current facility-administered medications on file prior to visit.    Allergies:  Allergies  Allergen Reactions  . Codeine Nausea And Vomiting    History   Social History  . Marital Status: Married    Spouse Name: N/A    Number of Children: 3  . Years of Education: 12th   Occupational History  .      unemploye   Social History Main Topics  . Smoking status: Former Smoker    Quit date: 05/26/1997  . Smokeless tobacco: Not on file  . Alcohol Use: No  . Drug Use: No  . Sexually Active: Not on file   Other Topics Concern  . Not on file   Social History Narrative   Pt lives at home with her spouse.   Caffeine Use- 3 sodas daily    Family History  Problem Relation Age of Onset  . Coronary artery disease    . Heart attack Mother      Review of Systems:  See HPI for pertinent ROS. All other ROS negative.    Physical Exam: Blood pressure 94/62, pulse 88, temperature 97.6 F (36.4 C), temperature source Oral, resp. rate 16, weight 0 lb (0 kg)., Body mass index is 0.00 kg/(m^2). General: Appears in no acute distress. Head: Normocephalic, atraumatic,Throat is normal. No Mass visualized. No swelling visualized.  Neck: Supple. No thyromegaly. No lymphadenopathy.No tenderness with palpation of neck. No mass palpated.  Lungs: Clear bilaterally to auscultation without wheezes, rales, or rhonchi. Breathing is unlabored. Heart: RRR with S1 S2. No murmurs, rubs, or gallops. Abdomen: Soft, non-tender, non-distended with normoactive bowel sounds. No hepatomegaly. No rebound/guarding. No obvious abdominal masses. Musculoskeletal:  Strength and tone normal for age. Extremities/Skin: Warm and dry.  No edema.  Neuro: Alert and oriented X 3. Moves all  extremities spontaneously.  CNII-XII grossly in tact. Psych:  Responds to questions appropriately with a normal affect.     ASSESSMENT AND PLAN:  62 y.o.  year old female with  1. Weakness CBC was run in office: No significant abnormality.  - COMPLETE METABOLIC PANEL WITH GFR - TSH - CBC w/[facility] & 3 Part Diff  2. Fatigue - COMPLETE METABOLIC PANEL WITH GFR - TSH - CBC w/[facility] & 3 Part Diff  3. Dysphagia She has seen Dr Collene Mares. Will refer to her on "urgent"basis. Told pt to call here if she has not gotten any phone call reg this referral in next 3 days. - Ambulatory referral to Gastroenterology   Signed, [place] Re[place], [place], [facility] 12/22/2012 3:51 PM

## 2012-12-23 LAB — COMPLETE METABOLIC PANEL WITH GFR
ALT: 21 U/L (ref 0–35)
AST: 14 U/L (ref 0–37)
Albumin: 4.2 g/dL (ref 3.5–5.2)
Alkaline Phosphatase: 95 U/L (ref 39–117)
BUN: 41 mg/dL — ABNORMAL HIGH (ref 6–23)
CO2: 19 mEq/L (ref 19–32)
Calcium: 9.8 mg/dL (ref 8.4–10.5)
Chloride: 78 mEq/L — ABNORMAL LOW (ref 96–112)
Creat: 1.86 mg/dL — ABNORMAL HIGH (ref 0.50–1.10)
GFR, Est African American: 33 mL/min — ABNORMAL LOW
GFR, Est Non African American: 28 mL/min — ABNORMAL LOW
Glucose, Bld: 595 mg/dL (ref 70–99)
Potassium: 4.9 mEq/L (ref 3.5–5.3)
Sodium: 119 mEq/L — ABNORMAL LOW (ref 135–145)
Total Bilirubin: 0.7 mg/dL (ref 0.3–1.2)
Total Protein: 7.1 g/dL (ref 6.0–8.3)

## 2012-12-23 LAB — TSH: TSH: 3.584 u[IU]/mL (ref 0.350–4.500)

## 2012-12-25 ENCOUNTER — Emergency Department (HOSPITAL_COMMUNITY): Payer: Commercial Managed Care - PPO

## 2012-12-25 ENCOUNTER — Other Ambulatory Visit: Payer: Self-pay

## 2012-12-25 ENCOUNTER — Encounter (HOSPITAL_COMMUNITY): Payer: Self-pay

## 2012-12-25 ENCOUNTER — Inpatient Hospital Stay (HOSPITAL_COMMUNITY)
Admission: EM | Admit: 2012-12-25 | Discharge: 2012-12-27 | DRG: 682 | Disposition: A | Discharge status: Home / Self Care | Payer: Commercial Managed Care - PPO | Attending: Internal Medicine | Admitting: Internal Medicine

## 2012-12-25 DIAGNOSIS — F419 Anxiety disorder, unspecified: Secondary | ICD-10-CM

## 2012-12-25 DIAGNOSIS — F32A Depression, unspecified: Secondary | ICD-10-CM

## 2012-12-25 DIAGNOSIS — B001 Herpesviral vesicular dermatitis: Secondary | ICD-10-CM

## 2012-12-25 DIAGNOSIS — E663 Overweight: Secondary | ICD-10-CM

## 2012-12-25 DIAGNOSIS — R32 Unspecified urinary incontinence: Secondary | ICD-10-CM

## 2012-12-25 DIAGNOSIS — N390 Urinary tract infection, site not specified: Secondary | ICD-10-CM | POA: Diagnosis present

## 2012-12-25 DIAGNOSIS — I10 Essential (primary) hypertension: Secondary | ICD-10-CM | POA: Diagnosis present

## 2012-12-25 DIAGNOSIS — R000 Tachycardia, unspecified: Secondary | ICD-10-CM

## 2012-12-25 DIAGNOSIS — I2510 Atherosclerotic heart disease of native coronary artery without angina pectoris: Secondary | ICD-10-CM | POA: Diagnosis present

## 2012-12-25 DIAGNOSIS — F3289 Other specified depressive episodes: Secondary | ICD-10-CM | POA: Diagnosis present

## 2012-12-25 DIAGNOSIS — E860 Dehydration: Secondary | ICD-10-CM | POA: Diagnosis present

## 2012-12-25 DIAGNOSIS — R5383 Other fatigue: Secondary | ICD-10-CM

## 2012-12-25 DIAGNOSIS — A419 Sepsis, unspecified organism: Secondary | ICD-10-CM | POA: Diagnosis present

## 2012-12-25 DIAGNOSIS — N179 Acute kidney failure, unspecified: Principal | ICD-10-CM

## 2012-12-25 DIAGNOSIS — E871 Hypo-osmolality and hyponatremia: Secondary | ICD-10-CM | POA: Diagnosis present

## 2012-12-25 DIAGNOSIS — E876 Hypokalemia: Secondary | ICD-10-CM | POA: Diagnosis present

## 2012-12-25 DIAGNOSIS — F411 Generalized anxiety disorder: Secondary | ICD-10-CM | POA: Diagnosis present

## 2012-12-25 DIAGNOSIS — E669 Obesity, unspecified: Secondary | ICD-10-CM

## 2012-12-25 DIAGNOSIS — I4510 Unspecified right bundle-branch block: Secondary | ICD-10-CM | POA: Diagnosis present

## 2012-12-25 DIAGNOSIS — E119 Type 2 diabetes mellitus without complications: Secondary | ICD-10-CM

## 2012-12-25 DIAGNOSIS — E8770 Fluid overload, unspecified: Secondary | ICD-10-CM

## 2012-12-25 DIAGNOSIS — F329 Major depressive disorder, single episode, unspecified: Secondary | ICD-10-CM | POA: Diagnosis present

## 2012-12-25 DIAGNOSIS — N189 Chronic kidney disease, unspecified: Secondary | ICD-10-CM | POA: Diagnosis present

## 2012-12-25 DIAGNOSIS — Z794 Long term (current) use of insulin: Secondary | ICD-10-CM

## 2012-12-25 DIAGNOSIS — R531 Weakness: Secondary | ICD-10-CM | POA: Diagnosis present

## 2012-12-25 DIAGNOSIS — Z79899 Other long term (current) drug therapy: Secondary | ICD-10-CM

## 2012-12-25 DIAGNOSIS — E785 Hyperlipidemia, unspecified: Secondary | ICD-10-CM

## 2012-12-25 DIAGNOSIS — R5381 Other malaise: Secondary | ICD-10-CM

## 2012-12-25 DIAGNOSIS — R9430 Abnormal result of cardiovascular function study, unspecified: Secondary | ICD-10-CM

## 2012-12-25 DIAGNOSIS — R9389 Abnormal findings on diagnostic imaging of other specified body structures: Secondary | ICD-10-CM

## 2012-12-25 DIAGNOSIS — N289 Disorder of kidney and ureter, unspecified: Secondary | ICD-10-CM

## 2012-12-25 DIAGNOSIS — I959 Hypotension, unspecified: Secondary | ICD-10-CM

## 2012-12-25 DIAGNOSIS — Z8673 Personal history of transient ischemic attack (TIA), and cerebral infarction without residual deficits: Secondary | ICD-10-CM

## 2012-12-25 DIAGNOSIS — R0602 Shortness of breath: Secondary | ICD-10-CM

## 2012-12-25 DIAGNOSIS — I252 Old myocardial infarction: Secondary | ICD-10-CM

## 2012-12-25 DIAGNOSIS — R410 Disorientation, unspecified: Secondary | ICD-10-CM

## 2012-12-25 HISTORY — DX: Cerebral infarction, unspecified: I63.9

## 2012-12-25 HISTORY — DX: Acute myocardial infarction, unspecified: I21.9

## 2012-12-25 HISTORY — DX: Syncope and collapse: R55

## 2012-12-25 LAB — GLUCOSE, CAPILLARY
Glucose-Capillary: 373 mg/dL — ABNORMAL HIGH (ref 70–99)
Glucose-Capillary: 244 mg/dL — ABNORMAL HIGH (ref 70–99)
Glucose-Capillary: 238 mg/dL — ABNORMAL HIGH (ref 70–99)

## 2012-12-25 LAB — PROTIME-INR
INR: 0.95 (ref 0.00–1.49)
Prothrombin Time: 12.5 seconds (ref 11.6–15.2)

## 2012-12-25 LAB — COMPREHENSIVE METABOLIC PANEL
ALT: 20 U/L (ref 0–35)
AST: 16 U/L (ref 0–37)
Albumin: 3 g/dL — ABNORMAL LOW (ref 3.5–5.2)
Alkaline Phosphatase: 79 U/L (ref 39–117)
BUN: 65 mg/dL — ABNORMAL HIGH (ref 6–23)
CO2: 25 mEq/L (ref 19–32)
Calcium: 8.9 mg/dL (ref 8.4–10.5)
Chloride: 85 mEq/L — ABNORMAL LOW (ref 96–112)
Creatinine, Ser: 2.04 mg/dL — ABNORMAL HIGH (ref 0.50–1.10)
GFR calc Af Amer: 29 mL/min — ABNORMAL LOW (ref >90)
GFR calc non Af Amer: 25 mL/min — ABNORMAL LOW (ref >90)
Glucose, Bld: 349 mg/dL — ABNORMAL HIGH (ref 70–99)
Potassium: 3.3 mEq/L — ABNORMAL LOW (ref 3.5–5.1)
Sodium: 126 mEq/L — ABNORMAL LOW (ref 135–145)
Total Bilirubin: 0.3 mg/dL (ref 0.3–1.2)
Total Protein: 6.3 g/dL (ref 6.0–8.3)

## 2012-12-25 LAB — URINALYSIS, ROUTINE W REFLEX MICROSCOPIC
Bilirubin Urine: NEGATIVE
Glucose, UA: >1000 mg/dL — AB
Hgb urine dipstick: NEGATIVE
Ketones, ur: NEGATIVE mg/dL
Leukocytes, UA: NEGATIVE
Nitrite: NEGATIVE
Protein, ur: NEGATIVE mg/dL
Specific Gravity, Urine: 1.026 (ref 1.005–1.030)
Urobilinogen, UA: 0.2 mg/dL (ref 0.0–1.0)
pH: 5 (ref 5.0–8.0)

## 2012-12-25 LAB — TSH: TSH: 1.374 u[IU]/mL (ref 0.350–4.500)

## 2012-12-25 LAB — APTT: aPTT: 24 seconds (ref 24–37)

## 2012-12-25 LAB — ABO/RH: ABO/RH(D): A POS

## 2012-12-25 LAB — CBC
HCT: 41.6 % (ref 36.0–46.0)
Hemoglobin: 14.5 g/dL (ref 12.0–15.0)
MCH: 26.8 pg (ref 26.0–34.0)
MCHC: 34.9 g/dL (ref 30.0–36.0)
MCV: 76.9 fL — ABNORMAL LOW (ref 78.0–100.0)
Platelets: 283 10*3/uL (ref 150–400)
RBC: 5.41 MIL/uL — ABNORMAL HIGH (ref 3.87–5.11)
RDW: 14.7 % (ref 11.5–15.5)
WBC: 8.2 10*3/uL (ref 4.0–10.5)

## 2012-12-25 LAB — CORTISOL: Cortisol, Plasma: 45.6 ug/dL

## 2012-12-25 LAB — URINE MICROSCOPIC-ADD ON

## 2012-12-25 LAB — TROPONIN I
Troponin I: <0.3 ng/mL (ref <0.30)
Troponin I: <0.3 ng/mL (ref <0.30)

## 2012-12-25 LAB — PROCALCITONIN: Procalcitonin: <0.1 ng/mL

## 2012-12-25 LAB — MRSA PCR SCREENING: MRSA by PCR: POSITIVE — AB

## 2012-12-25 LAB — LACTIC ACID, PLASMA: Lactic Acid, Venous: 2 mmol/L (ref 0.5–2.2)

## 2012-12-25 LAB — FIBRINOGEN: Fibrinogen: 373 mg/dL (ref 204–475)

## 2012-12-25 MED ORDER — CLONAZEPAM 0.5 MG PO TABS: 0.5000 mg | ORAL_TABLET | Freq: Three times a day (TID) | ORAL | Status: DC | PRN

## 2012-12-25 MED ORDER — ONDANSETRON HCL 4 MG/2ML IJ SOLN: 4.0000 mg | Freq: Four times a day (QID) | INTRAMUSCULAR | Status: DC | PRN

## 2012-12-25 MED ORDER — SODIUM CHLORIDE 0.9 % IJ SOLN
3.0000 mL | Freq: Two times a day (BID) | INTRAMUSCULAR | Status: DC
  Administered 2012-12-25 – 2012-12-26 (×3): 3 mL via INTRAVENOUS

## 2012-12-25 MED ORDER — PREGABALIN 50 MG PO CAPS: 150.0000 mg | ORAL_CAPSULE | Freq: Three times a day (TID) | ORAL | Status: DC

## 2012-12-25 MED ORDER — ONDANSETRON HCL 4 MG PO TABS: 4.0000 mg | ORAL_TABLET | Freq: Four times a day (QID) | ORAL | Status: DC | PRN

## 2012-12-25 MED ORDER — PAROXETINE HCL ER 25 MG PO TB24: 25.0000 mg | ORAL_TABLET | Freq: Every morning | ORAL | Status: DC

## 2012-12-25 MED ORDER — INSULIN ASPART 100 UNIT/ML ~~LOC~~ SOLN
0.0000 [IU] | Freq: Every day | SUBCUTANEOUS | Status: DC
Start: 2012-12-25 — End: 2012-12-27
  Administered 2012-12-25: 2 [IU] via SUBCUTANEOUS
  Administered 2012-12-26: 3 [IU] via SUBCUTANEOUS

## 2012-12-25 MED ORDER — VANCOMYCIN HCL IN DEXTROSE 1-5 GM/200ML-% IV SOLN: 1000.0000 mg | Freq: Once | INTRAVENOUS | Status: DC

## 2012-12-25 MED ORDER — CHLORHEXIDINE GLUCONATE CLOTH 2 % EX PADS
6.0000 | MEDICATED_PAD | Freq: Every day | CUTANEOUS | Status: DC
  Administered 2012-12-26 – 2012-12-27 (×2): 6 via TOPICAL

## 2012-12-25 MED ORDER — SODIUM CHLORIDE 0.9 % IV BOLUS (SEPSIS)
1000.0000 mL | INTRAVENOUS | Status: DC | PRN
Start: 2012-12-25 — End: 2012-12-27

## 2012-12-25 MED ORDER — PIPERACILLIN-TAZOBACTAM 3.375 G IVPB 30 MIN
3.3750 g | Freq: Once | INTRAVENOUS | Status: AC
  Administered 2012-12-25: 3.375 g via INTRAVENOUS
  Filled 2012-12-25: qty 50

## 2012-12-25 MED ORDER — VANCOMYCIN HCL 10 G IV SOLR
2000.0000 mg | Freq: Once | INTRAVENOUS | Status: AC
  Administered 2012-12-25: 2000 mg via INTRAVENOUS
  Filled 2012-12-25: qty 2000

## 2012-12-25 MED ORDER — PREGABALIN 50 MG PO CAPS
150.0000 mg | ORAL_CAPSULE | Freq: Three times a day (TID) | ORAL | Status: DC
  Administered 2012-12-25 – 2012-12-27 (×5): 150 mg via ORAL
  Filled 2012-12-25 (×3): qty 1
  Filled 2012-12-25 (×2): qty 3

## 2012-12-25 MED ORDER — SODIUM CHLORIDE 0.9 % IV BOLUS (SEPSIS)
1000.0000 mL | Freq: Once | INTRAVENOUS | Status: AC
  Administered 2012-12-25: 1000 mL via INTRAVENOUS

## 2012-12-25 MED ORDER — ATORVASTATIN CALCIUM 80 MG PO TABS
80.0000 mg | ORAL_TABLET | Freq: Every day | ORAL | Status: DC
  Administered 2012-12-25 – 2012-12-26 (×2): 80 mg via ORAL
  Filled 2012-12-25 (×3): qty 1

## 2012-12-25 MED ORDER — POTASSIUM CHLORIDE CRYS ER 20 MEQ PO TBCR
40.0000 meq | EXTENDED_RELEASE_TABLET | Freq: Once | ORAL | Status: AC
  Administered 2012-12-25: 40 meq via ORAL
  Filled 2012-12-25: qty 2

## 2012-12-25 MED ORDER — CLOPIDOGREL BISULFATE 75 MG PO TABS
75.0000 mg | ORAL_TABLET | Freq: Every day | ORAL | Status: DC
  Administered 2012-12-26 – 2012-12-27 (×2): 75 mg via ORAL
  Filled 2012-12-25 (×2): qty 1

## 2012-12-25 MED ORDER — ASPIRIN EC 81 MG PO TBEC
81.0000 mg | DELAYED_RELEASE_TABLET | Freq: Every day | ORAL | Status: DC
  Administered 2012-12-26 – 2012-12-27 (×2): 81 mg via ORAL
  Filled 2012-12-25 (×2): qty 1

## 2012-12-25 MED ORDER — HYDROMORPHONE HCL PF 1 MG/ML IJ SOLN: 0.5000 mg | INTRAMUSCULAR | Status: DC | PRN

## 2012-12-25 MED ORDER — INSULIN ASPART 100 UNIT/ML ~~LOC~~ SOLN
0.0000 [IU] | Freq: Three times a day (TID) | SUBCUTANEOUS | Status: DC
  Administered 2012-12-25: 5 [IU] via SUBCUTANEOUS
  Administered 2012-12-26: 15 [IU] via SUBCUTANEOUS
  Administered 2012-12-26 (×2): 3 [IU] via SUBCUTANEOUS
  Administered 2012-12-27: 8 [IU] via SUBCUTANEOUS

## 2012-12-25 MED ORDER — SODIUM CHLORIDE 0.9 % IV SOLN
INTRAVENOUS | Status: DC
  Administered 2012-12-25: 1000 mL via INTRAVENOUS
  Administered 2012-12-26: 03:00:00 via INTRAVENOUS
  Administered 2012-12-26: 1000 mL via INTRAVENOUS

## 2012-12-25 MED ORDER — MUPIROCIN 2 % EX OINT
1.0000 "application " | TOPICAL_OINTMENT | Freq: Two times a day (BID) | CUTANEOUS | Status: DC
  Administered 2012-12-25 – 2012-12-27 (×4): 1 via NASAL
  Filled 2012-12-25: qty 22

## 2012-12-25 MED ORDER — HEPARIN SODIUM (PORCINE) 5000 UNIT/ML IJ SOLN
5000.0000 [IU] | Freq: Three times a day (TID) | INTRAMUSCULAR | Status: DC
  Administered 2012-12-25 – 2012-12-27 (×5): 5000 [IU] via SUBCUTANEOUS
  Filled 2012-12-25 (×8): qty 1

## 2012-12-25 MED ORDER — INSULIN ASPART 100 UNIT/ML ~~LOC~~ SOLN: 0.0000 [IU] | Freq: Three times a day (TID) | SUBCUTANEOUS | Status: DC

## 2012-12-25 MED ORDER — OMEGA-3-ACID ETHYL ESTERS 1 G PO CAPS
1.0000 g | ORAL_CAPSULE | Freq: Every day | ORAL | Status: DC
  Administered 2012-12-25 – 2012-12-26 (×2): 1 g via ORAL
  Filled 2012-12-25 (×3): qty 1

## 2012-12-25 MED ORDER — ACETAMINOPHEN 650 MG RE SUPP: 650.0000 mg | Freq: Four times a day (QID) | RECTAL | Status: DC | PRN

## 2012-12-25 MED ORDER — PAROXETINE HCL ER 25 MG PO TB24
25.0000 mg | ORAL_TABLET | Freq: Every day | ORAL | Status: DC
  Administered 2012-12-26 – 2012-12-27 (×2): 25 mg via ORAL
  Filled 2012-12-25 (×2): qty 1

## 2012-12-25 MED ORDER — PIPERACILLIN-TAZOBACTAM 3.375 G IVPB
3.3750 g | Freq: Three times a day (TID) | INTRAVENOUS | Status: DC
  Administered 2012-12-25 – 2012-12-27 (×5): 3.375 g via INTRAVENOUS
  Filled 2012-12-25 (×7): qty 50

## 2012-12-25 MED ORDER — ACETAMINOPHEN 325 MG PO TABS: 650.0000 mg | ORAL_TABLET | Freq: Four times a day (QID) | ORAL | Status: DC | PRN

## 2012-12-25 MED ORDER — SODIUM CHLORIDE 0.9 % IJ SOLN
3.0000 mL | INTRAMUSCULAR | Status: DC | PRN
  Administered 2012-12-26: 3 mL via INTRAVENOUS

## 2012-12-25 MED ORDER — PAROXETINE HCL ER 37.5 MG PO TB24
37.5000 mg | ORAL_TABLET | Freq: Every day | ORAL | Status: DC
  Administered 2012-12-26 – 2012-12-27 (×2): 37.5 mg via ORAL
  Filled 2012-12-25 (×2): qty 1

## 2012-12-25 MED ORDER — INSULIN GLARGINE 100 UNIT/ML ~~LOC~~ SOLN
20.0000 [IU] | Freq: Every day | SUBCUTANEOUS | Status: DC
  Administered 2012-12-25: 20 [IU] via SUBCUTANEOUS
  Filled 2012-12-25: qty 0.2

## 2012-12-25 MED ORDER — OMEGA-3 FATTY ACIDS 1000 MG PO CAPS: 1.0000 g | ORAL_CAPSULE | Freq: Every day | ORAL | Status: DC

## 2012-12-25 MED ORDER — VANCOMYCIN HCL 10 G IV SOLR
1500.0000 mg | INTRAVENOUS | Status: DC
  Administered 2012-12-26 – 2012-12-27 (×2): 1500 mg via INTRAVENOUS
  Filled 2012-12-25 (×2): qty 1500

## 2012-12-25 MED ORDER — PAROXETINE HCL ER 37.5 MG PO TB24: 37.5000 mg | ORAL_TABLET | Freq: Every morning | ORAL | Status: DC

## 2012-12-25 MED ORDER — NITROGLYCERIN 0.4 MG SL SUBL
0.4000 mg | SUBLINGUAL_TABLET | SUBLINGUAL | Status: DC | PRN
Start: 2012-12-25 — End: 2012-12-27

## 2012-12-25 MED ORDER — SODIUM CHLORIDE 0.9 % IV SOLN: 250.0000 mL | INTRAVENOUS | Status: DC | PRN

## 2012-12-25 NOTE — ED Notes (Signed)
Patient states that she is feeling better. Remains alert and oriented. No complaint of pain

## 2012-12-25 NOTE — Progress Notes (Signed)
ANTIBIOTIC CONSULT NOTE - INITIAL  Pharmacy Consult for Vancomycin, Zosyn Indication: rule out sepsis  Allergies  Allergen Reactions  . Codeine Nausea And Vomiting   Patient Measurements:   TBW 101kg  Vital Signs: BP: 76/48 mmHg (08/02 1258) Pulse Rate: 84 (08/02 1257) Intake/Output from previous day:   Intake/Output from this shift:   Labs:  Recent Labs  12/22/12 1505  WBC 9.9  HGB 16.5*  PLT 368  CREATININE 1.86*   The CrCl is unknown because both a height and weight (above a minimum accepted value) are required for this calculation. No results found for this basename: VANCOTROUGH, VANCOPEAK, VANCORANDOM, GENTTROUGH, GENTPEAK, GENTRANDOM, TOBRATROUGH, TOBRAPEAK, TOBRARND, AMIKACINPEAK, AMIKACINTROU, AMIKACIN,  in the last 72 hours   Microbiology: No results found for this or any previous visit (from the past 720 hour(s)).  Medical History: Past Medical History  Diagnosis Date  . Tachycardia     Sinus tachycardia  . Fluid overload   . Overweight(278.02)   . DM (diabetes mellitus)   . Dyslipidemia   . SOB (shortness of breath)   . CAD (coronary artery disease)     MI in 2000 - MI  2007 - treated bare metal stent (no nuclear since then as 9/11)  . HTN (hypertension)   . Anemia   . Osteoarthritis   . Sinusitis 1/12  . Ejection fraction     good,echo,2007 / 60-65%, echo, September 19, 2010  . RBBB (right bundle branch block)     Old  . Renal insufficiency     June, 2012, with relative intravascular dehydration and glucose out of control.  Peak creatinine 2.6  . Chest x-ray abnormality     June, 2012, questionable abnormality /  repeat chest x-ray done January 29, 2011,, the film says comparison to October 31, 2010, normal chest x-ray.  . Urinary incontinence     November, 2013  . Obesity   . H/O cold sores   . Anxiety and depression    Medications:  Anti-infectives   Start     Dose/Rate Route Frequency Ordered Stop   12/25/12 1400  vancomycin (VANCOCIN)  2,000 mg in sodium chloride 0.9 % 500 mL IVPB     2,000 mg 250 mL/hr over 120 Minutes Intravenous  Once 12/25/12 1322     12/25/12 1315  piperacillin-tazobactam (ZOSYN) IVPB 3.375 g     3.375 g 100 mL/hr over 30 Minutes Intravenous  Once 12/25/12 1310     12/25/12 1315  vancomycin (VANCOCIN) IVPB 1000 mg/200 mL premix  Status:  Discontinued     1,000 mg 200 mL/hr over 60 Minutes Intravenous  Once 12/25/12 1310 12/25/12 1322     Assessment: 27 yoF in ED with presumed sepsis. Previously seen at PCP 7/22 with c/o viral gastroenteritis, and 7/30 with 5 day hx of esophageal obstruction (follow up with GI unknown). Hx of CAD w/stent, DM, HTN, renal insufficiency. Begin Vancomycin and Zosyn per Pharmacy.  SCr on admit = 1.86, Cl ~ 35 ml/min (normalized)  Pt obese (101kg from 12/13/12) with renal dysfunction, will give loading dose of Vancomycin  Goal of Therapy:  Vancomycin trough level 15-20 mcg/ml  Plan:  Zosyn 3.375gm IV q8h, first dose over 30 min in ED, then 4 hr infusion Vancomycin 2gm x1, then 1500mg  q24 hr Follow up renal function, adjust as necessary  Minda Ditto PharmD Pager [phone] 12/25/2012, 1:39 PM

## 2012-12-25 NOTE — ED Notes (Signed)
Patient came in due to generalized weakness and disoriented at times. Was disoriented this morning. Patient is diabetic and is noncompliant.

## 2012-12-25 NOTE — ED Provider Notes (Signed)
CSN: [id number]     Arrival date & time 12/25/12  1235 History     First MD Initiated Contact with Patient 12/25/12 1253     No chief complaint on file.  (Consider location/radiation/quality/duration/timing/severity/associated sxs/prior Treatment) HPI Comments: Patient with generalized malaise for past 2 days.   Patient is a 62 y.o. female presenting with weakness.  Weakness This is a new problem. The current episode started 2 days ago. The problem occurs constantly. The problem has been rapidly worsening. Pertinent negatives include no chest pain, no abdominal pain, no headaches and no shortness of breath. Nothing aggravates the symptoms. Nothing relieves the symptoms.    Past Medical History  Diagnosis Date  . Tachycardia     Sinus tachycardia  . Fluid overload   . Overweight(278.02)   . DM (diabetes mellitus)   . Dyslipidemia   . SOB (shortness of breath)   . CAD (coronary artery disease)     MI in 2000 - MI  2007 - treated bare metal stent (no nuclear since then as 9/11)  . HTN (hypertension)   . Anemia   . Osteoarthritis   . Sinusitis 1/12  . Ejection fraction     good,echo,2007 / 60-65%, echo, September 19, 2010  . RBBB (right bundle branch block)     Old  . Renal insufficiency     June, 2012, with relative intravascular dehydration and glucose out of control.  Peak creatinine 2.6  . Chest x-ray abnormality     June, 2012, questionable abnormality /  repeat chest x-ray done January 29, 2011,, the film says comparison to October 31, 2010, normal chest x-ray.  . Urinary incontinence     November, 2013  . Obesity   . H/O cold sores   . Anxiety and depression    Past Surgical History  Procedure Laterality Date  . Left knee surgery  Left 10/25/2006   Family History  Problem Relation Age of Onset  . Coronary artery disease    . Heart attack Mother    History  Substance Use Topics  . Smoking status: Former Smoker    Quit date: 05/26/1997  . Smokeless tobacco: Not on  file  . Alcohol Use: No   OB History   Grav Para Term Preterm Abortions TAB SAB Ect Mult Living                 Review of Systems  Constitutional: Negative for fever.  Respiratory: Negative for shortness of breath.   Cardiovascular: Negative for chest pain.  Gastrointestinal: Negative for vomiting and abdominal pain.  Genitourinary: Negative for dysuria and urgency.  Neurological: Positive for weakness. Negative for headaches.  All other systems reviewed and are negative.    Allergies  Codeine  Home Medications   Current Outpatient Rx  Name  Route  Sig  Dispense  Refill  . acyclovir (ZOVIRAX) 200 MG capsule   Oral   Take 200 mg by mouth daily as needed (for outbreaks). PRN HSV1 outbrea         . aspirin EC 81 MG tablet   Oral   Take 81 mg by mouth daily.         Marland Kitchen atorvastatin (LIPITOR) 80 MG tablet   Oral   Take 1 tablet (80 mg total) by mouth daily.   30 tablet   10   . carvedilol (COREG) 25 MG tablet   Oral   Take 1 tablet (25 mg total) by mouth 2 (two) times daily with a  meal.   60 tablet   6   . clonazePAM (KLONOPIN) 0.5 MG tablet   Oral   Take 1 tablet (0.5 mg total) by mouth 3 (three) times daily as needed.   90 tablet   1   . clopidogrel (PLAVIX) 75 MG tablet      TAKE 1 TABLET BY MOUTH EVERY DAY   90 tablet   1   . fish oil-omega-3 fatty acids 1000 MG capsule   Oral   Take 1 g by mouth daily.          . Insulin Isophane Human (NOVOLIN N [place])   Subcutaneous   Inject into the skin.           . Insulin Regular Human (NOVOLIN R IJ)   Injection   Inject 25 Units as directed.           . isosorbide dinitrate (ISORDIL) 10 MG tablet   Oral   Take 1 tablet (10 mg total) by mouth 2 (two) times daily.   60 tablet   5   . losartan (COZAAR) 50 MG tablet      TAKE 1 TABLET BY MOUTH DAILY   30 tablet   5   . nitroGLYCERIN (NITROSTAT) 0.4 MG SL tablet   Sublingual   Place 0.4 mg under the tongue every 5 (five) minutes as needed for  chest pain.          Marland Kitchen PARoxetine (PAXIL CR) 25 MG 24 hr tablet   Oral   Take 1 tablet (25 mg total) by mouth every morning.   30 tablet   3     Take with 37.5 mg tablet to equal 62.5 daily dose   . PARoxetine (PAXIL-CR) 37.5 MG 24 hr tablet   Oral   Take 37.5 mg by mouth every morning. Take with 25mg  for ttal of 62.5mg          . pregabalin (LYRICA) 150 MG capsule   Oral   Take 1 capsule (150 mg total) by mouth 3 (three) times daily.   90 capsule   5   . tolterodine (DETROL LA) 4 MG 24 hr capsule   Oral   Take 4 mg by mouth daily.          BP 76/48  Pulse 84  Resp 20  SpO2 98% Physical Exam  Nursing note and vitals reviewed. Constitutional: She is oriented to person, place, and time. She appears well-developed and well-nourished. No distress.  HENT:  Head: Normocephalic and atraumatic.  Eyes: EOM are normal. Pupils are equal, round, and reactive to light.  Neck: Normal range of motion. Neck supple.  Cardiovascular: Normal rate and regular rhythm.  Exam reveals no friction rub.   No murmur heard. Pulmonary/Chest: Effort normal and breath sounds normal. No respiratory distress. She has no wheezes. She has no rales.  Abdominal: Soft. She exhibits no distension. There is no tenderness. There is no rebound.  Musculoskeletal: Normal range of motion. She exhibits no edema.  Neurological: She is alert and oriented to person, place, and time.  Skin: She is not diaphoretic.    ED Course   Procedures (including critical care time)  Labs Reviewed  GLUCOSE, CAPILLARY - Abnormal; Notable for the following:    Glucose-Capillary 373 (*)    All other components within normal limits  CULTURE, BLOOD (ROUTINE X 2)  CULTURE, BLOOD (ROUTINE X 2)  URINE CULTURE  CBC  COMPREHENSIVE METABOLIC PANEL  LACTIC ACID, PLASMA  URINALYSIS, ROUTINE  W REFLEX MICROSCOPIC  CORTISOL  TROPONIN I  PROTIME-INR  APTT  FIBRINOGEN  TYPE AND SCREEN   No results found. 1. Severe  hypotension   2. Confusion   3. Dehydration     Angiocath insertion Performed by: Osvaldo Shipper  Consent: Verbal consent obtained. Risks and benefits: risks, benefits and alternatives were discussed Time out: Immediately prior to procedure a "time out" was called to verify the correct patient, procedure, equipment, support staff and site/side marked as required.  Preparation: Patient was prepped and draped in the usual sterile fashion.  Vein Location: L EJ  Not Ultrasound Guided  Gauge: 18  Normal blood return and flush without difficulty Patient tolerance: Patient tolerated the procedure well with no immediate complications.  CRITICAL CARE Performed by: Osvaldo Shipper   Total critical care time: 30 minutes  Critical care time was exclusive of separately billable procedures and treating other patients.  Critical care was necessary to treat or prevent imminent or life-threatening deterioration.  Critical care was time spent personally by me on the following activities: development of treatment plan with patient and/or surrogate as well as nursing, discussions with consultants, evaluation of patient's response to treatment, examination of patient, obtaining history from patient or surrogate, ordering and performing treatments and interventions, ordering and review of laboratory studies, ordering and review of radiographic studies, pulse oximetry and re-evaluation of patient's condition.   MDM   Patient is a 62 year old female who presents with confusion, general malaise. and I can easily feel on arrival, severely hypotensive. Lowest blood pressure recorded 66/48 with a equivalent manual pressure done by tech at the bedside.  patient with some slurred speech and confusion. Husband states she's not this for the past 2 days and worsening. Patient denies any fever, belly pain, cough, shortness of breath, chest pain. I spent time coping nurses and access in place a  left-sided EJ 18-gauge IV resuscitation. 3 L normal saline her on 3 different IV sites to administer rapid fluid boluses. Sepsis protocol initiated with broad-spectrum antibiotics of Vancomycin and Zosyn. On reexam at roughly 30 minutes later, pressure greatly increased to the 100s. She'll heart rate remained stable in the 80s. After that initial blood pressure increase, she did not drop her blood pressures after that. Labs returned without elevation white count..  Admitted to [facility] unit.    Osvaldo Shipper, MD 12/25/12 [phone]

## 2012-12-25 NOTE — H&P (Signed)
PCP:   [name], PA-C   Chief Complaint:  Generalized weakness.  HPI: This is a 62 year old female, with known history of Insulin-requiring DM-2, dyslipidemia, HTN, TIAs, CAD, s/p MI 2000 and 2007, s/p bare metal stent, chronic RBBB, anemia, OA, anxiety/depression. Per EMR, patient was seen at primary care provider's office on 12/13/12, for a probable viral gastroenteritis, associated with weakness and diarrhea. This has since resolved. She was seen again on 12/22/12, at which time she felt fatigued, generally weak and complained of dysphagia to solids, and a GI appointment ment was arranged. Since then, according to patient and family, she has been able to eat without difficulty, and as a matter of fact, had a regular breakfast this morning. Weakness has however, progressed, and has become quite severe. Patient has had increased thirst, despite drinking a lot of fluids and stopping her diuretics, and urinates frequently. She had a normal bowel movement this AM. Denies fever, chills, chest pain, cough or SOB. No abdominal pain, diarrhea or vomiting. Her husband brought her to the ED today, as she felt very unwell. In the ED, BP was found to be 66/48, improving to 114/58 after 3L iv NS, temp 95.8. Labs revealed Sodium 126, potasium 3.3, creatine 2.04, BUN 65, CO2 25, Glucose 345 and wcc 8.2. Patient was placed on Bear-Hugger in ED, commenced on septic work up, and administered iv Vanc/Zosyn, on suspicion of sepsis.   Allergies:   Allergies  Allergen Reactions  . Codeine Nausea And Vomiting      Past Medical History  Diagnosis Date  . Tachycardia     Sinus tachycardia  . Fluid overload   . Overweight(278.02)   . DM (diabetes mellitus)   . Dyslipidemia   . SOB (shortness of breath)   . CAD (coronary artery disease)     MI in 2000 - MI  2007 - treated bare metal stent (no nuclear since then as 9/11)  . HTN (hypertension)   . Anemia   . Osteoarthritis   . Sinusitis 1/12  . Ejection  fraction     good,echo,2007 / 60-65%, echo, September 19, 2010  . RBBB (right bundle branch block)     Old  . Renal insufficiency     June, 2012, with relative intravascular dehydration and glucose out of control.  Peak creatinine 2.6  . Chest x-ray abnormality     June, 2012, questionable abnormality /  repeat chest x-ray done January 29, 2011,, the film says comparison to October 31, 2010, normal chest x-ray.  . Urinary incontinence     November, 2013  . Obesity   . H/O cold sores   . Anxiety and depression   . Acute MI     X2  . Syncope     likely due to low blood sugar  . Stroke     mini strokes  . Hematuria     Past Surgical History  Procedure Laterality Date  . Left knee surgery  Left 10/25/2006  . Cardiac surgery      with 2 stents  . Abdominal hysterectomy      Prior to Admission medications   Medication Sig Start Date End Date Taking? Authorizing Provider  acyclovir (ZOVIRAX) 200 MG capsule Take 200 mg by mouth daily as needed (for outbreaks).    Yes Historical Provider, MD  aspirin EC 81 MG tablet Take 81 mg by mouth daily.   Yes Historical Provider, MD  atorvastatin (LIPITOR) 80 MG tablet Take 1 tablet (80 mg  total) by mouth daily. 04/13/12  Yes Carlena Bjornstad, MD  carvedilol (COREG) 25 MG tablet Take 1 tablet (25 mg total) by mouth 2 (two) times daily with a meal. 06/16/12  Yes Carlena Bjornstad, MD  clonazePAM (KLONOPIN) 0.5 MG tablet Take 1 tablet (0.5 mg total) by mouth 3 (three) times daily as needed. 12/13/12  Yes [name], PA-C  clopidogrel (PLAVIX) 75 MG tablet Take 75 mg by mouth daily.   Yes Historical Provider, MD  fish oil-omega-3 fatty acids 1000 MG capsule Take 1 g by mouth daily.    Yes Historical Provider, MD  insulin NPH (HUMULIN N,NOVOLIN N) 100 UNIT/ML injection Inject 40 Units into the skin 3 (three) times daily.   Yes Historical Provider, MD  insulin regular (NOVOLIN R,HUMULIN R) 100 units/mL injection Inject 10 Units into the skin 3 (three) times daily  before meals.   Yes Historical Provider, MD  isosorbide dinitrate (ISORDIL) 10 MG tablet Take 1 tablet (10 mg total) by mouth 2 (two) times daily. 10/12/12  Yes Carlena Bjornstad, MD  losartan (COZAAR) 50 MG tablet Take 50 mg by mouth daily.   Yes Historical Provider, MD  nitroGLYCERIN (NITROSTAT) 0.4 MG SL tablet Place 0.4 mg under the tongue every 5 (five) minutes as needed for chest pain.    Yes Historical Provider, MD  PARoxetine (PAXIL-CR) 25 MG 24 hr tablet Take 25 mg by mouth every morning. Taken with 37.5mg  tablet to equal 62.5mg  daily.   Yes Historical Provider, MD  PARoxetine (PAXIL-CR) 37.5 MG 24 hr tablet Take 37.5 mg by mouth every morning. Take with 25mg  tablet to equal 62.5mg  daily.   Yes Historical Provider, MD  pregabalin (LYRICA) 150 MG capsule Take 1 capsule (150 mg total) by mouth 3 (three) times daily. 09/16/12  Yes Garvin Fila, MD    Social History: Patient reports that she quit smoking about 15 years ago. She does not have any smokeless tobacco history on file. She reports that she does not drink alcohol or use illicit drugs.  Family History  Problem Relation Age of Onset  . Coronary artery disease    . Heart attack Mother     Review of Systems:  As per HPI and chief complaint. Patent denies diminished appetite, weight loss, fever, chills, headache, blurred vision, difficulty in speaking, dysphagia, chest pain, cough, shortness of breath, orthopnea, paroxysmal nocturnal dyspnea, nausea, diaphoresis, abdominal pain, vomiting, diarrhea, belching, heartburn, hematemesis, melena, dysuria, hematochezia, lower extremity swelling, pain, or redness. The rest of the systems review is negative.  Physical Exam:  General:  Patient does not appear to be in obvious acute distress. Alert, communicative, fully oriented, talking in complete sentences, not short of breath at rest. No longer in Bear-Hugger.  HEENT:  No clinical pallor, no jaundice, no conjunctival injection or discharge.  Buccal mucosa appears "dry".  NECK:  Supple, JVP not seen, no carotid bruits, no palpable lymphadenopathy, no palpable goiter. CHEST:  Clinically clear to auscultation, no wheezes, no crackles. HEART:  Sounds 1 and 2 heard, normal, regular, no murmurs. ABDOMEN:  Moderately obese, soft, non-tender, no palpable organomegaly, no palpable masses, normal bowel sounds. GENITALIA:  Not examined. LOWER EXTREMITIES:  No pitting edema, palpable peripheral pulses. MUSCULOSKELETAL SYSTEM:  Generalized osteoarthritic changes, otherwise, normal. CENTRAL NERVOUS SYSTEM:  No focal neurologic deficit on gross examination.  Labs on Admission:  Results for orders placed during the hospital encounter of 12/25/12 (from the past 48 hour(s))  GLUCOSE, CAPILLARY  Status: Abnormal   Collection Time    12/25/12 12:47 PM      Result Value Range   Glucose-Capillary 373 (*) 70 - 99 mg/dL   Comment 1 Notify RN    CBC     Status: Abnormal   Collection Time    12/25/12  1:10 PM      Result Value Range   WBC 8.2  4.0 - 10.5 K/uL   RBC 5.41 (*) 3.87 - 5.11 MIL/uL   Hemoglobin 14.5  12.0 - 15.0 g/dL   HCT 41.6  36.0 - 46.0 %   MCV 76.9 (*) 78.0 - 100.0 fL   MCH 26.8  26.0 - 34.0 pg   MCHC 34.9  30.0 - 36.0 g/dL   RDW 14.7  11.5 - 15.5 %   Platelets 283  150 - 400 K/uL  COMPREHENSIVE METABOLIC PANEL     Status: Abnormal   Collection Time    12/25/12  1:10 PM      Result Value Range   Sodium 126 (*) 135 - 145 mEq/L   Potassium 3.3 (*) 3.5 - 5.1 mEq/L   Chloride 85 (*) 96 - 112 mEq/L   CO2 25  19 - 32 mEq/L   Glucose, Bld 349 (*) 70 - 99 mg/dL   BUN 65 (*) 6 - 23 mg/dL   Creatinine, Ser 2.04 (*) 0.50 - 1.10 mg/dL   Calcium 8.9  8.4 - 10.5 mg/dL   Total Protein 6.3  6.0 - 8.3 g/dL   Albumin 3.0 (*) 3.5 - 5.2 g/dL   AST 16  0 - 37 U/L   ALT 20  0 - 35 U/L   Alkaline Phosphatase 79  39 - 117 U/L   Total Bilirubin 0.3  0.3 - 1.2 mg/dL   GFR calc non Af Amer 25 (*) >90 mL/min   GFR calc Af Amer 29 (*) >90  mL/min   Comment:            The eGFR has been calculated     using the CKD EPI equation.     This calculation has not been     validated in all clinical     situations.     eGFR's persistently     <90 mL/min signify     possible Chronic Kidney Disease.  TROPONIN I     Status: None   Collection Time    12/25/12  1:10 PM      Result Value Range   Troponin I <0.30  <0.30 ng/mL   Comment:            Due to the release kinetics of cTnI,     a negative result within the first hours     of the onset of symptoms does not rule out     myocardial infarction with certainty.     If myocardial infarction is still suspected,     repeat the test at appropriate intervals.  PROTIME-INR     Status: None   Collection Time    12/25/12  1:10 PM      Result Value Range   Prothrombin Time 12.5  11.6 - 15.2 seconds   INR 0.95  0.00 - 1.49  APTT     Status: None   Collection Time    12/25/12  1:10 PM      Result Value Range   aPTT 24  24 - 37 seconds  FIBRINOGEN     Status: None   Collection Time  12/25/12  1:10 PM      Result Value Range   Fibrinogen 373  204 - 475 mg/dL  URINALYSIS, ROUTINE W REFLEX MICROSCOPIC     Status: Abnormal   Collection Time    12/25/12  1:48 PM      Result Value Range   Color, Urine YELLOW  YELLOW   APPearance CLOUDY (*) CLEAR   Specific Gravity, Urine 1.026  1.005 - 1.030   pH 5.0  5.0 - 8.0   Glucose, UA >1000 (*) NEGATIVE mg/dL   Hgb urine dipstick NEGATIVE  NEGATIVE   Bilirubin Urine NEGATIVE  NEGATIVE   Ketones, ur NEGATIVE  NEGATIVE mg/dL   Protein, ur NEGATIVE  NEGATIVE mg/dL   Urobilinogen, UA 0.2  0.0 - 1.0 mg/dL   Nitrite NEGATIVE  NEGATIVE   Leukocytes, UA NEGATIVE  NEGATIVE  URINE MICROSCOPIC-ADD ON     Status: None   Collection Time    12/25/12  1:48 PM      Result Value Range   Squamous Epithelial / LPF RARE  RARE   Urine-Other AMORPHOUS URATES/PHOSPHATES    LACTIC ACID, PLASMA     Status: None   Collection Time    12/25/12  2:46 PM       Result Value Range   Lactic Acid, Venous 2.0  0.5 - 2.2 mmol/L  TYPE AND SCREEN     Status: None   Collection Time    12/25/12  2:46 PM      Result Value Range   ABO/RH(D) A POS     Antibody Screen POS     Sample Expiration 12/28/2012    ABO/RH     Status: None   Collection Time    12/25/12  2:46 PM      Result Value Range   ABO/RH(D) A POS      Radiological Exams on Admission: Dg Chest Portable 1 View  12/25/2012   *RADIOLOGY REPORT*  Clinical Data: Sepsis.  PORTABLE CHEST - 1 VIEW  Comparison: Two-view chest x-ray 04/26/2011, 10/31/2010.  Findings: Cardiac silhouette normal in size for AP portable technique, unchanged.  Thoracic aorta mildly atherosclerotic, unchanged.  Hilar and mediastinal contours otherwise unremarkable. Lungs clear.  Bronchovascular markings normal.  Pulmonary vascularity normal.  No pneumothorax.  No pleural effusions.  IMPRESSION: No acute cardiopulmonary disease.   Original Report Authenticated By: Evangeline Dakin, M.D.    Assessment/Plan Active Problems:    1. Dehydration/ARF (acute renal failure): Patient presented with progressive generalized weakness, following a viral gastroenteritis approximately 1 week ago, and laboratory studies reveal a bump in creatinine to 1.86 on 12/22/12 (baseline creatinine of 0.82 on 02/08/11), and today, creatinine is 2.04, BUN 65, consistent with dehydration and acute renal failure. Etiology is multifactorial, secondary to GI fluid losses, hyperglycemia and osmotic diuresis. Clinically, patient looks very dry. We shall manage with aggressive ivi fluids, and follow renal indices. Nephrotoxins have been placed on hold.  2. Hypotension: This is due to #1 above, in the setting of continued antihypertensive therapy. BP was found to be 66/48, improving to 114/58 after 3L iv NS. Management will be as described above. Pre-admission antihypertensives have been discontinued.  3. Diabetes mellitus: Patient has known insulin-requiring type 2  DM. Per EMR, random blood glucose was 595 on 12/22/12. Today, Glucose id [id number]. Fortunately, there is no evidence of DKA. This is no doubt contributory to #s 1 & 1 above, on the basis of osmotic diuresis. Managing with SSI/Lantus. HBA1C has been ordered.  4. Hypokalemia/Hyponatremia:  Potassium was 3.3 on admission, and sodium 126 (Sodium was 119 on 12/22/12), consistent with dehydration, volume depletion, GI loss, as well as a possible pseudohyponatremia from hyperglycemia. Repleting potassium as indicated. Hyponatremia should respond to ivi NS. Following Lytes.  5. Generalized weakness/Query Sepsis: Patient's generalized weakness is predictably multifactorial, secondary to above-described metabolic derangements and dehydration, as is her hypotension. [place] is normal, CXR is devoid of acute disease, U/A is negative, and patient has no localizing signs of infection. Lactic acid is normal at 2.0. Although she had a mild hypothermia of 95.8 on arrival, temperature quickly normalized with iv fluids and brief utilization of bear-Hugger. Strongly doubt sepsis. Blood an urine cultures have been sent off, and iv vancomycin/Zosyn commenced by ED MD. Will continue these, but if remains normopyrexial and with negative cultures, will consider discontinuation of antibiotics ASAP. Will check Procalcitonin, check TSH for completeness. 6.  Anxiety and depression: Not problematic.  7. CAD: Patient is s/p MI x 2, and has bare metal stent. No chest pain or SOB at this time. Will cycle cardiac enzymes for completeness.   Further management will depend on clinical course.  Comment: Patient is FULL CODE.    Time Spent on Admission: 1 Hour.   Eman Morimoto,CHRISTOPHER 12/25/2012, 5:01 PM

## 2012-12-26 DIAGNOSIS — E871 Hypo-osmolality and hyponatremia: Secondary | ICD-10-CM

## 2012-12-26 LAB — CBC
HCT: 38.8 % (ref 36.0–46.0)
Hemoglobin: 13.1 g/dL (ref 12.0–15.0)
MCH: 26.6 pg (ref 26.0–34.0)
MCHC: 33.8 g/dL (ref 30.0–36.0)
MCV: 78.7 fL (ref 78.0–100.0)
Platelets: 215 10*3/uL (ref 150–400)
RBC: 4.93 MIL/uL (ref 3.87–5.11)
RDW: 15 % (ref 11.5–15.5)
WBC: 6.8 10*3/uL (ref 4.0–10.5)

## 2012-12-26 LAB — COMPREHENSIVE METABOLIC PANEL
ALT: 16 U/L (ref 0–35)
AST: 13 U/L (ref 0–37)
Albumin: 2.6 g/dL — ABNORMAL LOW (ref 3.5–5.2)
Alkaline Phosphatase: 68 U/L (ref 39–117)
BUN: 43 mg/dL — ABNORMAL HIGH (ref 6–23)
CO2: 25 mEq/L (ref 19–32)
Calcium: 8.2 mg/dL — ABNORMAL LOW (ref 8.4–10.5)
Chloride: 100 mEq/L (ref 96–112)
Creatinine, Ser: 1.36 mg/dL — ABNORMAL HIGH (ref 0.50–1.10)
GFR calc Af Amer: 47 mL/min — ABNORMAL LOW (ref >90)
GFR calc non Af Amer: 40 mL/min — ABNORMAL LOW (ref >90)
Glucose, Bld: 243 mg/dL — ABNORMAL HIGH (ref 70–99)
Potassium: 3.1 mEq/L — ABNORMAL LOW (ref 3.5–5.1)
Sodium: 134 mEq/L — ABNORMAL LOW (ref 135–145)
Total Bilirubin: 0.2 mg/dL — ABNORMAL LOW (ref 0.3–1.2)
Total Protein: 5.4 g/dL — ABNORMAL LOW (ref 6.0–8.3)

## 2012-12-26 LAB — GLUCOSE, CAPILLARY
Glucose-Capillary: 200 mg/dL — ABNORMAL HIGH (ref 70–99)
Glucose-Capillary: 186 mg/dL — ABNORMAL HIGH (ref 70–99)
Glucose-Capillary: 355 mg/dL — ABNORMAL HIGH (ref 70–99)
Glucose-Capillary: 287 mg/dL — ABNORMAL HIGH (ref 70–99)

## 2012-12-26 LAB — URINE CULTURE: Colony Count: 75000

## 2012-12-26 LAB — TROPONIN I
Troponin I: <0.3 ng/mL (ref <0.30)
Troponin I: <0.3 ng/mL (ref <0.30)

## 2012-12-26 LAB — HEMOGLOBIN A1C
Hgb A1c MFr Bld: 14.4 % — ABNORMAL HIGH (ref <5.7)
Mean Plasma Glucose: 367 mg/dL — ABNORMAL HIGH (ref <117)

## 2012-12-26 LAB — MAGNESIUM: Magnesium: 2.1 mg/dL (ref 1.5–2.5)

## 2012-12-26 MED ORDER — INSULIN NPH (HUMAN) (ISOPHANE) 100 UNIT/ML ~~LOC~~ SUSP
30.0000 [IU] | Freq: Two times a day (BID) | SUBCUTANEOUS | Status: DC
  Administered 2012-12-26 – 2012-12-27 (×2): 30 [IU] via SUBCUTANEOUS
  Filled 2012-12-26: qty 10

## 2012-12-26 MED ORDER — INSULIN NPH (HUMAN) (ISOPHANE) 100 UNIT/ML ~~LOC~~ SUSP
40.0000 [IU] | Freq: Three times a day (TID) | SUBCUTANEOUS | Status: DC
  Filled 2012-12-26: qty 10

## 2012-12-26 MED ORDER — POTASSIUM CHLORIDE CRYS ER 20 MEQ PO TBCR
40.0000 meq | EXTENDED_RELEASE_TABLET | Freq: Once | ORAL | Status: AC
  Administered 2012-12-26: 40 meq via ORAL
  Filled 2012-12-26: qty 2

## 2012-12-26 MED ORDER — PNEUMOCOCCAL VAC POLYVALENT 25 MCG/0.5ML IJ INJ
0.5000 mL | INJECTION | INTRAMUSCULAR | Status: AC
  Administered 2012-12-26: 0.5 mL via INTRAMUSCULAR
  Filled 2012-12-26: qty 0.5

## 2012-12-26 MED ORDER — ISOSORBIDE DINITRATE 10 MG PO TABS
10.0000 mg | ORAL_TABLET | Freq: Two times a day (BID) | ORAL | Status: DC
  Administered 2012-12-26 – 2012-12-27 (×3): 10 mg via ORAL
  Filled 2012-12-26 (×4): qty 1

## 2012-12-26 MED ORDER — PNEUMOCOCCAL VAC POLYVALENT 25 MCG/0.5ML IJ INJ: 0.5000 mL | INJECTION | INTRAMUSCULAR | Status: DC

## 2012-12-26 NOTE — Progress Notes (Signed)
[facility] PROGRESS NOTE  Susan [name] [id number] DOB: 20-Jun-1950 DOA: 12/25/2012 PCP: Dena Billet BETH, PA-C  Assessment/Plan: Active Problems:   Dehydration   ARF (acute renal failure)   Hypotension   Diabetes mellitus   Generalized weakness   Sepsis   Hypokalemia   Hyponatremia   Anxiety and depression    1. Dehydration/ARF (acute renal failure): Patient presented with progressive generalized weakness, following a viral gastroenteritis approximately 1 week ago, and laboratory studies reveal a bump in creatinine to 1.86 on 12/22/12 (baseline creatinine of 0.82 on 02/08/11), and on 12/25/12, creatinine was 2.04, BUN 65, consistent with dehydration and acute renal failure. Etiology is multifactorial, secondary to GI fluid losses, hyperglycemia and osmotic diuresis. Clinically, patient looked very dry. Managing with aggressive ivi fluids, and following renal indices. Nephrotoxins have been placed on hold. Hydration status has improved, and creatinine is 1.36 today.  2. History of HTN/Hypotension: This was due to #1 above, in the setting of continued antihypertensive therapy. BP was found to be 66/48 on presentation, improving to 114/58 after 3L iv NS. Managing as described above. Pre-admission antihypertensives have been discontinued for now. BP has normalized at 125/49-149/65, this AM. Will observe BP, and if continuing to climb, will gingerly reintroduce antihypertensives. For now, will restart Isordil.  3. Diabetes mellitus: Patient has known insulin-requiring type 2 DM. Per EMR, random blood glucose was 595 on 12/22/12. On admission, Glucose was 349. Fortunately, there is no evidence of DKA. This is no doubt contributory to #s 1 & 1 above, on the basis of osmotic diuresis. Managed initially with SSI/Lantus. HBA1C is 14.4. This AM, CBGs are in the 200s. Have restated home insulin regimen in a modified dose, and will adjust as indicated.  4. Hypokalemia/Hyponatremia: Potassium was 3.3 on  admission, and sodium 126 (Sodium was 119 on 12/22/12), consistent with dehydration, volume depletion, GI loss, as well as a possible pseudohyponatremia from hyperglycemia. Repleting potassium as indicated. Hyponatremia has responded to ivi NS, and is practically resolved today, at 134. Following Lytes.  5. Generalized weakness/Query Sepsis: Patient's generalized weakness is predictably multifactorial, secondary to above-described metabolic derangements and dehydration, as is her hypotension. [place] is normal, CXR is devoid of acute disease, U/A is negative, and patient has no localizing signs of infection. Lactic acid is normal at 2.0. Although she had a mild hypothermia of 95.8 on arrival, temperature quickly normalized with iv fluids and brief utilization of bear-Hugger. Strongly doubt sepsis. Blood an urine cultures are pending. IV Vanc/Zosyn was commenced by ED MD. Will continue these, today, but if remains normopyrexial and with negative cultures, will likely discontinue antibiotics on 12/26/12. Procalcitonin is <0.10. TSH is 1.374.  6. Anxiety and depression: Not problematic.  7. CAD: Patient is s/p MI x 2, and has bare metal stent. No chest pain or SOB at this time. Cardiac enzymes remained unelevated.    Code Status: Full Code.  Family Communication:  Disposition Plan: To be determined. Stable for transfer to Med-Surg today.    Brief narrative: This is a 62 year old female, with known history of Insulin-requiring DM-2, dyslipidemia, HTN, TIAs, CAD, s/p MI 2000 and 2007, s/p bare metal stent, chronic RBBB, anemia, OA, anxiety/depression. Per EMR, patient was seen at primary care provider's office on 12/13/12, for a probable viral gastroenteritis, associated with weakness and diarrhea. This has since resolved. She was seen again on 12/22/12, at which time she felt fatigued, generally weak and complained of dysphagia to solids, and a GI appointment ment was arranged. Since  then, according to patient and  family, she has been able to eat without difficulty, and as a matter of fact, had a regular breakfast this morning. Weakness has however, progressed, and has become quite severe. Patient has had increased thirst, despite drinking a lot of fluids and stopping her diuretics, and urinates frequently. She had a normal bowel movement in AM of 12/25/12. Denies fever, chills, chest pain, cough or SOB. No abdominal pain, diarrhea or vomiting. Her husband brought her to the ED on 12/25/12, as she felt very unwell. In the ED, BP was found to be 66/48, improving to 114/58 after 3L iv NS, temp 95.8. Labs revealed Sodium 126, potasium 3.3, creatine 2.04, BUN 65, CO2 25, Glucose 345 and wcc 8.2. Patient was placed on Bear-Hugger in ED, commenced on septic work up, and administered iv Vanc/Zosyn, on suspicion of sepsis. Admitted for further evaluation and management.    Consultants:  N/A.   Procedures:  CXR.   Antibiotics:  Vancomycin 12/25/12>>>  Zosyn 12/25/12>>>  HPI/Subjective: No new issues. Feels much better.   Objective: Vital signs in last 24 hours: Temp:  [95.8 F (35.4 C)-99 F (37.2 C)] 97.7 F (36.5 C) (08/03 0400) Pulse Rate:  [83-97] 88 (08/03 0300) Resp:  [12-23] 20 (08/03 0300) BP: (66-149)/(47-65) 149/65 mmHg (08/03 0417) SpO2:  [96 %-100 %] 96 % (08/03 0300) Weight:  [102.6 kg (226 lb 3.1 oz)] 102.6 kg (226 lb 3.1 oz) (08/02 1800) Weight change:  Last BM Date: 12/25/12 (pta)  Intake/Output from previous day: 08/02 0701 - 08/03 0700 In: 2425 [P.O.:600; I.V.:1750; IV Piggyback:75] Out: 1900 [Urine:1900]     Physical Exam: General: Comfortable. Alert, communicative, fully oriented, talking in complete sentences, not short of breath at rest.  HEENT: No clinical pallor, no jaundice, no conjunctival injection or discharge. Hydration is fair.  NECK: Supple, JVP not seen, no carotid bruits, no palpable lymphadenopathy, no palpable goiter.  CHEST: Clinically clear to auscultation, no  wheezes, no crackles.  HEART: Sounds 1 and 2 heard, normal, regular, no murmurs.  ABDOMEN: Moderately obese, soft, non-tender, no palpable organomegaly, no palpable masses, normal bowel sounds.  GENITALIA: Not examined.  LOWER EXTREMITIES: No pitting edema, palpable peripheral pulses.  MUSCULOSKELETAL SYSTEM: Generalized osteoarthritic changes, otherwise, normal.  CENTRAL NERVOUS SYSTEM: No focal neurologic deficit on gross examination.   Lab Results:  Recent Labs  12/25/12 1310 12/26/12 0338  WBC 8.2 6.8  HGB 14.5 13.1  HCT 41.6 38.8  PLT 283 215    Recent Labs  12/25/12 1310 12/26/12 0338  NA 126* 134*  K 3.3* 3.1*  CL 85* 100  CO2 25 25  GLUCOSE 349* 243*  BUN 65* 43*  CREATININE 2.04* 1.36*  CALCIUM 8.9 8.2*   Recent Results (from the past 240 hour(s))  MRSA PCR SCREENING     Status: Abnormal   Collection Time    12/25/12  5:47 PM      Result Value Range Status   MRSA by PCR POSITIVE (*) NEGATIVE Final   Comment:            The GeneXpert MRSA Assay (FDA     approved for NASAL specimens     only), is one component of a     comprehensive MRSA colonization     surveillance program. It is not     intended to diagnose MRSA     infection nor to guide or     monitor treatment for     MRSA infections.  RESULT CALLED TO, READ BACK BY AND VERIFIED WITH:     Luiz Blare [id number] @ 1925 BY J SCOTTON     Studies/Results: Dg Chest Portable 1 View  12/25/2012   *RADIOLOGY REPORT*  Clinical Data: Sepsis.  PORTABLE CHEST - 1 VIEW  Comparison: Two-view chest x-ray 04/26/2011, 10/31/2010.  Findings: Cardiac silhouette normal in size for AP portable technique, unchanged.  Thoracic aorta mildly atherosclerotic, unchanged.  Hilar and mediastinal contours otherwise unremarkable. Lungs clear.  Bronchovascular markings normal.  Pulmonary vascularity normal.  No pneumothorax.  No pleural effusions.  IMPRESSION: No acute cardiopulmonary disease.   Original Report Authenticated  By: Evangeline Dakin, M.D.    Medications: Scheduled Meds: . aspirin EC  81 mg Oral Daily  . atorvastatin  80 mg Oral QHS  . Chlorhexidine Gluconate Cloth  6 each Topical Q0600  . clopidogrel  75 mg Oral Daily  . heparin  5,000 Units Subcutaneous Q8H  . insulin aspart  0-15 Units Subcutaneous TID WC  . insulin aspart  0-5 Units Subcutaneous QHS  . insulin glargine  20 Units Subcutaneous QHS  . mupirocin ointment  1 application Nasal BID  . omega-3 acid ethyl esters  1 g Oral QHS  . PARoxetine  25 mg Oral Daily   And  . PARoxetine  37.5 mg Oral Daily  . piperacillin-tazobactam (ZOSYN)  IV  3.375 g Intravenous Q8H  . pneumococcal 23 valent vaccine  0.5 mL Intramuscular Tomorrow-1000  . pregabalin  150 mg Oral TID  . sodium chloride  3 mL Intravenous Q12H  . vancomycin  1,500 mg Intravenous Q24H   Continuous Infusions: . sodium chloride 125 mL/hr at 12/26/12 0250   PRN Meds:.sodium chloride, acetaminophen, acetaminophen, clonazePAM, HYDROmorphone (DILAUDID) injection, nitroGLYCERIN, ondansetron (ZOFRAN) IV, ondansetron, sodium chloride, sodium chloride    LOS: 1 day   Shayne Deerman,CHRISTOPHER  [facility] Pager [phone]. If 8PM-8AM, please contact night-coverage at [email or web address], password [facility] 12/26/2012, 8:12 AM  LOS: 1 day

## 2012-12-27 LAB — CBC
HCT: 40.5 % (ref 36.0–46.0)
Hemoglobin: 13.9 g/dL (ref 12.0–15.0)
MCH: 27.4 pg (ref 26.0–34.0)
MCHC: 34.3 g/dL (ref 30.0–36.0)
MCV: 79.7 fL (ref 78.0–100.0)
Platelets: 187 10*3/uL (ref 150–400)
RBC: 5.08 MIL/uL (ref 3.87–5.11)
RDW: 15.1 % (ref 11.5–15.5)
WBC: 4.7 10*3/uL (ref 4.0–10.5)

## 2012-12-27 LAB — TYPE AND SCREEN
ABO/RH(D): A POS
Antibody Screen: POSITIVE
DAT, IgG: NEGATIVE
PT AG Type: NEGATIVE

## 2012-12-27 LAB — BASIC METABOLIC PANEL
BUN: 16 mg/dL (ref 6–23)
CO2: 25 mEq/L (ref 19–32)
Calcium: 8.5 mg/dL (ref 8.4–10.5)
Chloride: 108 mEq/L (ref 96–112)
Creatinine, Ser: 0.77 mg/dL (ref 0.50–1.10)
GFR calc Af Amer: >90 mL/min (ref >90)
GFR calc non Af Amer: 88 mL/min — ABNORMAL LOW (ref >90)
Glucose, Bld: 122 mg/dL — ABNORMAL HIGH (ref 70–99)
Potassium: 3.7 mEq/L (ref 3.5–5.1)
Sodium: 140 mEq/L (ref 135–145)

## 2012-12-27 LAB — GLUCOSE, CAPILLARY
Glucose-Capillary: 119 mg/dL — ABNORMAL HIGH (ref 70–99)
Glucose-Capillary: 287 mg/dL — ABNORMAL HIGH (ref 70–99)

## 2012-12-27 LAB — PROCALCITONIN: Procalcitonin: <0.1 ng/mL

## 2012-12-27 MED ORDER — AMOXICILLIN 500 MG PO CAPS: 500.0000 mg | ORAL_CAPSULE | Freq: Three times a day (TID) | ORAL | Status: DC

## 2012-12-27 NOTE — Progress Notes (Signed)
Patient discharged home in stable condition.  No change from morning assessment.  Instructions and scripts given to patient and spouse with verbal understanding.  MyChart decline d/t no computer and/or internet access.

## 2012-12-27 NOTE — Discharge Summary (Signed)
Physician Discharge Summary  Susan [name] [id number] DOB: 1951-03-29 DOA: 12/25/2012  PCP: [name], PA-C  Admit date: 12/25/2012 Discharge date: 12/27/2012  Time spent: 40 minutes  Recommendations for Outpatient Follow-up:  1. Follow up with PMD.   Discharge Diagnoses:  Active Problems:   Dehydration   ARF (acute renal failure)   Hypotension   Diabetes mellitus   Generalized weakness   Sepsis   Hypokalemia   Hyponatremia   Anxiety and depression   Discharge Condition: Satisfactory.   Diet recommendation: Heart-healthy/Carbohydrated-Modified.   Filed Weights   12/25/12 1800 12/26/12 1729  Weight: 102.6 kg (226 lb 3.1 oz) 98.2 kg (216 lb 7.9 oz)    History of present illness:  This is a 62 year old female, with known history of Insulin-requiring DM-2, dyslipidemia, HTN, TIAs, CAD, s/p MI 2000 and 2007, s/p bare metal stent, chronic RBBB, anemia, OA, anxiety/depression. Per EMR, patient was seen at primary care provider's office on 12/13/12, for a probable viral gastroenteritis, associated with weakness and diarrhea. This has since resolved. She was seen again on 12/22/12, at which time she felt fatigued, generally weak and complained of dysphagia to solids, and a GI appointment ment was arranged. Since then, according to patient and family, she has been able to eat without difficulty, and as a matter of fact, had a regular breakfast this morning. Weakness has however, progressed, and has become quite severe. Patient has had increased thirst, despite drinking a lot of fluids and stopping her diuretics, and urinates frequently. She had a normal bowel movement in AM of 12/25/12. Denies fever, chills, chest pain, cough or SOB. No abdominal pain, diarrhea or vomiting. Her husband brought her to the ED on 12/25/12, as she felt very unwell. In the ED, BP was found to be 66/48, improving to 114/58 after 3L iv NS, temp 95.8. Labs revealed Sodium 126, potasium 3.3, creatine 2.04, BUN 65, CO2  25, Glucose 345 and wcc 8.2. Patient was placed on Bear-Hugger in ED, commenced on septic work up, and administered iv Vanc/Zosyn, on suspicion of sepsis. Admitted for further evaluation and management.    Hospital Course:  1. Dehydration/ARF (acute renal failure): Patient presented with progressive generalized weakness, following a viral gastroenteritis approximately 1 week ago, and laboratory studies reveal a bump in creatinine to 1.86 on 12/22/12 (baseline creatinine of 0.82 on 02/08/11), and on 12/25/12, creatinine was 2.04, BUN 65, consistent with dehydration and acute renal failure. Etiology is multifactorial, secondary to GI fluid losses, hyperglycemia and osmotic diuresis. Clinically, patient looked very dry. Managed with aggressive ivi fluids, nephrotoxins were placed on hold, hydration status dramatically improved, and as of 12/27/12, creatinine was back to baseline at 0.77. Resolved.  2. History of HTN/Hypotension: This was due to #1 above, in the setting of continued antihypertensive therapy. BP was found to be 66/48 on presentation, improving to 114/58 after 3L iv NS. Managed as described above. Pre-admission antihypertensives were discontinued, and BP had normalized at 125/49-149/65, by AM of 12/26/12. As of AM 12/27/12, BP was 146/89, so pre-admission anti-hypertensives have been reinstated.  3. Diabetes mellitus: Patient has known insulin-requiring type 2 DM. Per EMR, random blood glucose was 595 on 12/22/12. On admission, Glucose was 349. Fortunately, there was no evidence of DKA. This is no doubt contributory to #s 1 & 1 above, on the basis of osmotic diuresis. Managed initially with SSI/Lantus. HBA1C is 14.4. By AM of 12/26/12, CBGs were in the 200s. Restarted Home insulin regimen in a modified dose, with satisfactory  control.  4. Hypokalemia/Hyponatremia: Potassium was 3.3 on admission, and sodium 126 (Sodium was 119 on 12/22/12), consistent with dehydration, volume depletion, GI loss, as well as a  possible pseudohyponatremia from hyperglycemia. Repleted potassium as indicated. Hyponatremia responded to ivi NS, and as of 12/27/12, had resoled, with sodium of 140.  5. Generalized weakness/UTI/Query Sepsis: Patient's generalized weakness is predictably multifactorial, secondary to above-described metabolic derangements and dehydration, as was her hypotension. [place] was normal, CXR was devoid of acute disease, U/A was negative, and patient has no localizing signs of infection. Lactic acid was normal at 2.0.  Procalcitonin was <0.10. TSH is 1.374. Although she had a mild hypothermia of 95.8 on arrival, temperature quickly normalized with iv fluids and brief utilization of bear-Hugger. Strongly doubt sepsis. IV Vanc/Zosyn was commenced by ED MD, patient remained normopyrexial, blood cultures remained negative, but urine culture grew Strept Agalactiae. Patient has been transitioned to oral Amoxicillin, to conclude antibiotics on 12/31/12.  6. Anxiety and depression: Not problematic.  7. CAD: Patient is s/p MI x 2, and has bare metal stent. No chest pain or SOB at this time. Cardiac enzymes remained unelevated.    Procedures:  See Below.   Consultations:  N/A.   Discharge Exam: Filed Vitals:   12/26/12 1729 12/26/12 1739 12/26/12 2130 12/27/12 0618  BP: 178/99 163/85 147/82 146/89  Pulse: 86  80 86  Temp: 97.9 F (36.6 C)  97.6 F (36.4 C) 98.4 F (36.9 C)  TempSrc: Oral  Oral Oral  Resp: 18  18 18   Height: 5\' 2"  (1.575 m)     Weight: 98.2 kg (216 lb 7.9 oz)     SpO2: 98%  97% 92%    General: Comfortable. Alert, communicative, fully oriented, talking in complete sentences, not short of breath at rest.  HEENT: No clinical pallor, no jaundice, no conjunctival injection or discharge. Hydration is fair.  NECK: Supple, JVP not seen, no carotid bruits, no palpable lymphadenopathy, no palpable goiter.  CHEST: Clinically clear to auscultation, no wheezes, no crackles.  HEART: Sounds 1 and 2 heard,  normal, regular, no murmurs.  ABDOMEN: Moderately obese, soft, non-tender, no palpable organomegaly, no palpable masses, normal bowel sounds.  GENITALIA: Not examined.  LOWER EXTREMITIES: No pitting edema, palpable peripheral pulses.  MUSCULOSKELETAL SYSTEM: Generalized osteoarthritic changes, otherwise, normal.  CENTRAL NERVOUS SYSTEM: No focal neurologic deficit on gross examination.  Discharge Instructions      Discharge Orders   Future Orders Complete By Expires     Diet - low sodium heart healthy  As directed     Diet Carb Modified  As directed     Increase activity slowly  As directed         Medication List         acyclovir 200 MG capsule  Commonly known as:  ZOVIRAX  Take 200 mg by mouth daily as needed (for outbreaks).     amoxicillin 500 MG capsule  Commonly known as:  AMOXIL  Take 1 capsule (500 mg total) by mouth 3 (three) times daily.     aspirin EC 81 MG tablet  Take 81 mg by mouth daily.     atorvastatin 80 MG tablet  Commonly known as:  LIPITOR  Take 1 tablet (80 mg total) by mouth daily.     carvedilol 25 MG tablet  Commonly known as:  COREG  Take 1 tablet (25 mg total) by mouth 2 (two) times daily with a meal.     clonazePAM 0.5 MG  tablet  Commonly known as:  KLONOPIN  Take 1 tablet (0.5 mg total) by mouth 3 (three) times daily as needed.     clopidogrel 75 MG tablet  Commonly known as:  PLAVIX  Take 75 mg by mouth daily.     fish oil-omega-3 fatty acids 1000 MG capsule  Take 1 g by mouth daily.     insulin NPH 100 UNIT/ML injection  Commonly known as:  HUMULIN N,NOVOLIN N  Inject 40 Units into the skin 3 (three) times daily.     insulin regular 100 units/mL injection  Commonly known as:  NOVOLIN R,HUMULIN R  Inject 10 Units into the skin 3 (three) times daily before meals.     isosorbide dinitrate 10 MG tablet  Commonly known as:  ISORDIL  Take 1 tablet (10 mg total) by mouth 2 (two) times daily.     losartan 50 MG tablet  Commonly  known as:  COZAAR  Take 50 mg by mouth daily.     nitroGLYCERIN 0.4 MG SL tablet  Commonly known as:  NITROSTAT  Place 0.4 mg under the tongue every 5 (five) minutes as needed for chest pain.     PARoxetine 37.5 MG 24 hr tablet  Commonly known as:  PAXIL-CR  Take 37.5 mg by mouth every morning. Take with 25mg  tablet to equal 62.5mg  daily.     PARoxetine 25 MG 24 hr tablet  Commonly known as:  PAXIL-CR  Take 25 mg by mouth every morning. Taken with 37.5mg  tablet to equal 62.5mg  daily.     pregabalin 150 MG capsule  Commonly known as:  LYRICA  Take 1 capsule (150 mg total) by mouth 3 (three) times daily.       Allergies  Allergen Reactions  . Codeine Nausea And Vomiting   Follow-up Information   Call [facility] B[facility], PA-C.   Contact information:   [place] [address] [phone]        The results of significant diagnostics from this hospitalization (including imaging, microbiology, ancillary and laboratory) are listed below for reference.    Significant Diagnostic Studies: Dg Chest Portable 1 View  12/25/2012   *RADIOLOGY REPORT*  Clinical Data: Sepsis.  PORTABLE CHEST - 1 VIEW  Comparison: Two-view chest x-ray 04/26/2011, 10/31/2010.  Findings: Cardiac silhouette normal in size for AP portable technique, unchanged.  Thoracic aorta mildly atherosclerotic, unchanged.  Hilar and mediastinal contours otherwise unremarkable. Lungs clear.  Bronchovascular markings normal.  Pulmonary vascularity normal.  No pneumothorax.  No pleural effusions.  IMPRESSION: No acute cardiopulmonary disease.   Original Report Authenticated By: Evangeline Dakin, M.D.    Microbiology: Recent Results (from the past 240 hour(s))  CULTURE, BLOOD (ROUTINE X 2)     Status: None   Collection Time    12/25/12  1:08 PM      Result Value Range Status   Specimen Description BLOOD NECK EJ   Final   Special Requests BOTTLES DRAWN AEROBIC ONLY 3CC   Final   Culture  Setup Time  12/25/2012 18:41   Final   Culture     Final   Value:        BLOOD CULTURE RECEIVED NO GROWTH TO DATE CULTURE WILL BE HELD FOR 5 DAYS BEFORE ISSUING A FINAL NEGATIVE REPORT   Report Status PENDING   Incomplete  CULTURE, BLOOD (ROUTINE X 2)     Status: None   Collection Time    12/25/12  1:10 PM  Result Value Range Status   Specimen Description BLOOD NECK EJ   Final   Special Requests BOTTLES DRAWN AEROBIC AND ANAEROBIC 5CC   Final   Culture  Setup Time 12/25/2012 18:41   Final   Culture     Final   Value:        BLOOD CULTURE RECEIVED NO GROWTH TO DATE CULTURE WILL BE HELD FOR 5 DAYS BEFORE ISSUING A FINAL NEGATIVE REPORT   Report Status PENDING   Incomplete  URINE CULTURE     Status: None   Collection Time    12/25/12  1:47 PM      Result Value Range Status   Specimen Description URINE, CATHETERIZED   Final   Special Requests NONE   Final   Culture  Setup Time 12/25/2012 19:27   Final   Colony Count 75,000 COLONIES/ML   Final   Culture     Final   Value: GROUP B STREP(S.AGALACTIAE)ISOLATED     Note: TESTING AGAINST S. AGALACTIAE NOT ROUTINELY PERFORMED DUE TO PREDICTABILITY OF AMP/PEN/VAN SUSCEPTIBILITY.   Report Status 12/26/2012 FINAL   Final  MRSA PCR SCREENING     Status: Abnormal   Collection Time    12/25/12  5:47 PM      Result Value Range Status   MRSA by PCR POSITIVE (*) NEGATIVE Final   Comment:            The GeneXpert MRSA Assay (FDA     approved for NASAL specimens     only), is one component of a     comprehensive MRSA colonization     surveillance program. It is not     intended to diagnose MRSA     infection nor to guide or     monitor treatment for     MRSA infections.     RESULT CALLED TO, READ BACK BY AND VERIFIED WITH:     Luiz Blare [id number] @ May: Basic Metabolic Panel:  Recent Labs Lab 12/22/12 1505 12/25/12 1310 12/26/12 0338 12/27/12 0815  NA 119* 126* 134* 140  K 4.9 3.3* 3.1* 3.7  CL 78* 85* 100 108   CO2 19 25 25 25   GLUCOSE 595* 349* 243* 122*  BUN 41* 65* 43* 16  CREATININE 1.86* 2.04* 1.36* 0.77  CALCIUM 9.8 8.9 8.2* 8.5  MG  --   --  2.1  --    Liver Function Tests:  Recent Labs Lab 12/22/12 1505 12/25/12 1310 12/26/12 0338  AST 14 16 13   ALT 21 20 16   ALKPHOS 95 79 68  BILITOT 0.7 0.3 0.2*  PROT 7.1 6.3 5.4*  ALBUMIN 4.2 3.0* 2.6*   No results found for this basename: LIPASE, AMYLASE,  in the last 168 hours No results found for this basename: AMMONIA,  in the last 168 hours CBC:  Recent Labs Lab 12/22/12 1505 12/25/12 1310 12/26/12 0338 12/27/12 0815  WBC 9.9 8.2 6.8 4.7  NEUTROABS 7.8*  --   --   --   HGB 16.5* 14.5 13.1 13.9  HCT 44.7 41.6 38.8 40.5  MCV 75.4* 76.9* 78.7 79.7  PLT 368 283 215 187   Cardiac Enzymes:  Recent Labs Lab 12/25/12 1310 12/25/12 1800 12/25/12 2355 12/26/12 0338  TROPONINI <0.30 <0.30 <0.30 <0.30   BNP: BNP (last 3 results)  Recent Labs  09/03/12 1633  PROBNP TEST NOT PERFORMED   CBG:  Recent Labs Lab 12/26/12 0752 12/26/12 1228 12/26/12  1549 12/26/12 2127 12/27/12 0755  GLUCAP 200* 186* 355* 287* 119*       Signed:  Shamone Winzer,CHRISTOPHER  [facility] 12/27/2012, 11:09 AM

## 2012-12-29 ENCOUNTER — Encounter: Payer: Self-pay | Admitting: Physician Assistant

## 2012-12-29 ENCOUNTER — Ambulatory Visit (INDEPENDENT_AMBULATORY_CARE_PROVIDER_SITE_OTHER): Payer: Commercial Managed Care - PPO | Admitting: Physician Assistant

## 2012-12-29 VITALS — BP 126/70 | HR 80 | Temp 98.7°F | Resp 18 | Wt 233.0 lb

## 2012-12-29 DIAGNOSIS — E119 Type 2 diabetes mellitus without complications: Secondary | ICD-10-CM

## 2012-12-29 DIAGNOSIS — E871 Hypo-osmolality and hyponatremia: Secondary | ICD-10-CM

## 2012-12-29 NOTE — Progress Notes (Signed)
Patient ID: Susan [name] MRN: [id number], DOB: 04/06/1951, 62 y.o. Date of Encounter: 12/29/2012, 2:35 PM    Chief Complaint:  Chief Complaint  Patient presents with  . hosp follow up     HPI: 62 y.o. year old white female is here for post hospital f/u. She was recently hopitalized with hyperglycemia which caused further abnormalities including dehydration, hyponatremia, ARF. She was treated with IVF, Insulin and electrolyte imbalance improved. She has no complaints. Was just told to f/u here.  She reports that she does have two differnet insulins at home. Says these had not run out, etc. However, does admit that she was not checking BS at all, not using insulin as directed. Does have f/u appt scheduled with Dr Bubba Camp for "later this month."      Home Meds: See attached medication section for any medications that were entered at today's visit. The computer does not put those onto this list.The following list is a list of meds entered prior to today's visit.   Current Outpatient Prescriptions on File Prior to Visit  Medication Sig Dispense Refill  . acyclovir (ZOVIRAX) 200 MG capsule Take 200 mg by mouth daily as needed (for outbreaks).       Marland Kitchen amoxicillin (AMOXIL) 500 MG capsule Take 1 capsule (500 mg total) by mouth 3 (three) times daily.  15 capsule  0  . aspirin EC 81 MG tablet Take 81 mg by mouth daily.      Marland Kitchen atorvastatin (LIPITOR) 80 MG tablet Take 1 tablet (80 mg total) by mouth daily.  30 tablet  10  . carvedilol (COREG) 25 MG tablet Take 1 tablet (25 mg total) by mouth 2 (two) times daily with a meal.  60 tablet  6  . clonazePAM (KLONOPIN) 0.5 MG tablet Take 1 tablet (0.5 mg total) by mouth 3 (three) times daily as needed.  90 tablet  1  . clopidogrel (PLAVIX) 75 MG tablet Take 75 mg by mouth daily.      . fish oil-omega-3 fatty acids 1000 MG capsule Take 1 g by mouth daily.       . insulin NPH (HUMULIN N,NOVOLIN N) 100 UNIT/ML injection Inject 40 Units into the skin 3 (three)  times daily.      . insulin regular (NOVOLIN R,HUMULIN R) 100 units/mL injection Inject 10 Units into the skin 3 (three) times daily before meals.      . isosorbide dinitrate (ISORDIL) 10 MG tablet Take 1 tablet (10 mg total) by mouth 2 (two) times daily.  60 tablet  5  . losartan (COZAAR) 50 MG tablet Take 50 mg by mouth daily.      . nitroGLYCERIN (NITROSTAT) 0.4 MG SL tablet Place 0.4 mg under the tongue every 5 (five) minutes as needed for chest pain.       Marland Kitchen PARoxetine (PAXIL-CR) 25 MG 24 hr tablet Take 25 mg by mouth every morning. Taken with 37.5mg  tablet to equal 62.5mg  daily.      Marland Kitchen PARoxetine (PAXIL-CR) 37.5 MG 24 hr tablet Take 37.5 mg by mouth every morning. Take with 25mg  tablet to equal 62.5mg  daily.      . pregabalin (LYRICA) 150 MG capsule Take 1 capsule (150 mg total) by mouth 3 (three) times daily.  90 capsule  5   No current facility-administered medications on file prior to visit.    Allergies:  Allergies  Allergen Reactions  . Codeine Nausea And Vomiting      Review of Systems: See HPI for  pertinent ROS. All other ROS negative.    Physical Exam: Blood pressure 126/70, pulse 80, temperature 98.7 F (37.1 C), temperature source Oral, resp. rate 18, weight 233 lb (105.688 kg)., Body mass index is 42.61 kg/(m^2). General: WNWD WF.  Appears in no acute distress. Lungs: Clear bilaterally to auscultation without wheezes, rales, or rhonchi. Breathing is unlabored. Heart: Regular rhythm. No murmurs, rubs, or gallops. Abdomen: Soft, non-tender, non-distended with normoactive bowel sounds. No hepatomegaly. No rebound/guarding. No obvious abdominal masses. Msk:  Strength and tone normal for age. Extremities/Skin: Warm and dry. 1+ Bilateral pedal  Edema is present.  Neuro: Alert and oriented X 3. Moves all extremities spontaneously. Gait is normal. CNII-XII grossly in tact. Psych:  Responds to questions appropriately with a normal affect.     ASSESSMENT AND PLAN:  62 y.o.  year old female with  1. Diabetes mellitus - BASIC METABOLIC PANEL WITH GFR  2. Hyponatremia - BASIC METABOLIC PANEL WITH GFR  Will recheck BMET to f/u severe hyperglycemia, which caused dehydration, ARF, hyponatremia.   [address] [place], [place], [facility] 12/29/2012 2:35 PM

## 2012-12-30 LAB — BASIC METABOLIC PANEL WITH GFR
BUN: 15 mg/dL (ref 6–23)
CO2: 26 mEq/L (ref 19–32)
Calcium: 8.5 mg/dL (ref 8.4–10.5)
Chloride: 104 mEq/L (ref 96–112)
Creat: 0.84 mg/dL (ref 0.50–1.10)
GFR, Est African American: 86 mL/min
GFR, Est Non African American: 74 mL/min
Glucose, Bld: 192 mg/dL — ABNORMAL HIGH (ref 70–99)
Potassium: 5.4 mEq/L — ABNORMAL HIGH (ref 3.5–5.3)
Sodium: 138 mEq/L (ref 135–145)

## 2012-12-31 ENCOUNTER — Telehealth: Payer: Self-pay | Admitting: Family Medicine

## 2012-12-31 LAB — CULTURE, BLOOD (ROUTINE X 2)
Culture: NO GROWTH
Culture: NO GROWTH

## 2012-12-31 NOTE — Telephone Encounter (Signed)
Aware of lab results and provider recommendations.  Reinforced to patient to take her medications as directed and not as she felt was needed.

## 2012-12-31 NOTE — Telephone Encounter (Signed)
Message copied by Olena Mater on Fri Dec 31, 2012 11:38 AM ------      Message from: Dena Billet      Created: Thu Dec 30, 2012  7:26 AM       Sodium, Kidney numbers back to normal.       Glucose was 192 at time of OV--make sure to take enough insulin.       (FYI: potassium slightly high but I checked med list-on no potassium supplement and no new meds to increase potassium-she recently had gotten everything out of balance-sodium, etc-potassium number related to this) ------

## 2013-01-11 ENCOUNTER — Telehealth: Payer: Self-pay | Admitting: Physician Assistant

## 2013-01-11 NOTE — Telephone Encounter (Signed)
YES  Pharmacy called.

## 2013-01-11 NOTE — Telephone Encounter (Signed)
Paroxetine CR 25 mg tab 1 QAM #30 Paroxetine ER 37.5 mg tab 1 QD #30  Pharmacy wants to make sure she is on both. She has been getting both since 10/13/12.

## 2013-01-31 ENCOUNTER — Other Ambulatory Visit: Payer: Self-pay | Admitting: Family Medicine

## 2013-02-01 ENCOUNTER — Telehealth: Payer: Self-pay | Admitting: Physician Assistant

## 2013-02-01 ENCOUNTER — Other Ambulatory Visit: Payer: Self-pay | Admitting: Physician Assistant

## 2013-02-01 ENCOUNTER — Other Ambulatory Visit: Payer: Self-pay | Admitting: Cardiology

## 2013-02-01 MED ORDER — LOSARTAN POTASSIUM 50 MG PO TABS: 50.0000 mg | ORAL_TABLET | Freq: Every day | ORAL | Status: DC

## 2013-02-01 NOTE — Telephone Encounter (Signed)
Med list has 200 mg daily prn outbreaks??  This is for 400mg  TID??  Please advise?

## 2013-02-01 NOTE — Telephone Encounter (Signed)
Medication refilled per protocol. 

## 2013-02-01 NOTE — Telephone Encounter (Signed)
Losartan Potassium 50 mg tab 1 QD #30

## 2013-02-01 NOTE — Telephone Encounter (Signed)
rx to pharmacy 

## 2013-02-01 NOTE — Telephone Encounter (Signed)
I would do 400mg  TID x 7 days for this. Can change it on her med list.

## 2013-03-02 ENCOUNTER — Other Ambulatory Visit: Payer: Self-pay | Admitting: Cardiology

## 2013-03-30 ENCOUNTER — Other Ambulatory Visit: Payer: Self-pay | Admitting: Cardiology

## 2013-03-31 ENCOUNTER — Other Ambulatory Visit: Payer: Self-pay | Admitting: Physician Assistant

## 2013-03-31 NOTE — Telephone Encounter (Signed)
Medication refilled per protocol. 

## 2013-04-10 ENCOUNTER — Other Ambulatory Visit: Payer: Self-pay | Admitting: Cardiology

## 2013-04-19 ENCOUNTER — Other Ambulatory Visit: Payer: Self-pay | Admitting: Physician Assistant

## 2013-04-19 NOTE — Telephone Encounter (Signed)
rx called in

## 2013-04-19 NOTE — Telephone Encounter (Signed)
Last RF 7/21 #90 +1.  Last OV 12/29/12  OK refill?

## 2013-04-19 NOTE — Telephone Encounter (Signed)
Approved for #90+ one additional refill.

## 2013-04-26 ENCOUNTER — Other Ambulatory Visit: Payer: Self-pay | Admitting: Neurology

## 2013-04-29 NOTE — Telephone Encounter (Signed)
Patient's prescription was faxed over to [facility] at [phone].

## 2013-05-06 ENCOUNTER — Encounter: Payer: Self-pay | Admitting: Cardiology

## 2013-05-06 ENCOUNTER — Ambulatory Visit (INDEPENDENT_AMBULATORY_CARE_PROVIDER_SITE_OTHER): Payer: Commercial Managed Care - PPO | Admitting: Cardiology

## 2013-05-06 VITALS — BP 140/78 | HR 103 | Ht 66.0 in | Wt 264.0 lb

## 2013-05-06 DIAGNOSIS — N289 Disorder of kidney and ureter, unspecified: Secondary | ICD-10-CM

## 2013-05-06 DIAGNOSIS — I2510 Atherosclerotic heart disease of native coronary artery without angina pectoris: Secondary | ICD-10-CM

## 2013-05-06 DIAGNOSIS — E119 Type 2 diabetes mellitus without complications: Secondary | ICD-10-CM

## 2013-05-06 DIAGNOSIS — R9430 Abnormal result of cardiovascular function study, unspecified: Secondary | ICD-10-CM

## 2013-05-06 DIAGNOSIS — R000 Tachycardia, unspecified: Secondary | ICD-10-CM

## 2013-05-06 DIAGNOSIS — I10 Essential (primary) hypertension: Secondary | ICD-10-CM

## 2013-05-06 DIAGNOSIS — R0602 Shortness of breath: Secondary | ICD-10-CM

## 2013-05-06 DIAGNOSIS — R0989 Other specified symptoms and signs involving the circulatory and respiratory systems: Secondary | ICD-10-CM

## 2013-05-06 DIAGNOSIS — I5032 Chronic diastolic (congestive) heart failure: Secondary | ICD-10-CM

## 2013-05-06 DIAGNOSIS — I509 Heart failure, unspecified: Secondary | ICD-10-CM

## 2013-05-06 MED ORDER — NITROGLYCERIN 0.4 MG SL SUBL: 0.4000 mg | SUBLINGUAL_TABLET | SUBLINGUAL | Status: DC | PRN

## 2013-05-06 MED ORDER — CARVEDILOL 12.5 MG PO TABS: ORAL_TABLET | ORAL | Status: DC

## 2013-05-06 NOTE — Assessment & Plan Note (Signed)
Patient had renal insufficiency when she was hospitalized in August, 2014. This improved. Renal will be checked again today.

## 2013-05-06 NOTE — Assessment & Plan Note (Addendum)
The patient has shortness of breath is multifactorial. I do believe that there is a volume component to it. I've instructed her to be more careful with her salt. She is to try to limit her fluid intake. It's very difficult to make decisions with this patient as her glucose shows marked variation leading to dehydration at times. I've encouraged her to cut back her salt and fluid intake. We will restart her beta blocker. I've encouraged her to take 1 extra dose of 40 of Lasix later today. Comment see her for followup. Chemistry will be checked today.

## 2013-05-06 NOTE — Assessment & Plan Note (Signed)
Her diabetes is being treated carefully.

## 2013-05-06 NOTE — Patient Instructions (Addendum)
Take one extra Lasix 40mg  this afternoon  Weigh this evening in your pajamas(sleeping clothes) and record. Then weigh every morning in your pajamas and record.  Lab today: Bmet  Reduce your sodium and Fluid intake  Your physician has requested that you have an echocardiogram. Echocardiography is a painless test that uses sound waves to create images of your heart. It provides your doctor with information about the size and shape of your heart and how well your heart's chambers and valves are working. This procedure takes approximately one hour. There are no restrictions for this procedure.  Your physician recommends that you schedule a follow-up appointment in: 1 st week of January 2015  Restart Carvedilol 12.5mg  twice daily an Rx has been sent to your pharmacy

## 2013-05-06 NOTE — Assessment & Plan Note (Signed)
The patient historically was on a beta blocker with sinus tachycardia. She then ran out of her medicines. Her rate is increased today. She'll be started back on a lower dose of carvedilol at 12.5 twice a day.

## 2013-05-06 NOTE — Progress Notes (Signed)
HPI  Patient is seen today for shortness of breath. Over time I followed her for tachycardia and hypertension. Her LV function has been norm she's had a problem with urinary incontinence. I saw her last in the office in November, 2013. I've reviewed the record since that time. She's had multiple medical issues. In August, 2014, she was admitted with hyperglycemia. She had significant problems with renal dysfunction and dehydration. After that time she stabilize. However she's had shortness of breath. A prior echo in December, 2013 had shown an ejection fraction of 55%. The study was technically difficult. A Doppler in December of 2013 revealed less than 50% bilateral disease.  Recently she's had significant weight gain. She also has shortness of breath. She has PND and orthopnea. She has edema. She is taking her diuretic. She's not having chest pain. She ran out of her carvedilol and has been off it for several weeks.   Allergies  Allergen Reactions  . Codeine Nausea And Vomiting    Current Outpatient Prescriptions  Medication Sig Dispense Refill  . acyclovir (ZOVIRAX) 400 MG tablet TAKE 1 TABLET BY MOUTH 3 TIMES A DAY  21 tablet  2  . aspirin EC 81 MG tablet Take 81 mg by mouth daily.      Marland Kitchen atorvastatin (LIPITOR) 80 MG tablet TAKE 1 TABLET (80 MG TOTAL) BY MOUTH DAILY.  30 tablet  0  . clonazePAM (KLONOPIN) 0.5 MG tablet TAKE 1 TABLET BY MOUTH 3 TIMES A DAY AS NEEDED  90 tablet  1  . clopidogrel (PLAVIX) 75 MG tablet Take 75 mg by mouth daily.      . fish oil-omega-3 fatty acids 1000 MG capsule Take 1 g by mouth daily.       . furosemide (LASIX) 40 MG tablet TAKE 2 TABLETS BY MOUTH EVERY DAY IN THE MORNING  60 tablet  3  . insulin NPH (HUMULIN N,NOVOLIN N) 100 UNIT/ML injection Inject 40 Units into the skin 2 (two) times daily at 8 am and 10 pm.       . insulin regular (NOVOLIN R,HUMULIN R) 100 units/mL injection Inject 10 Units into the skin 3 (three) times daily before meals.      .  isosorbide dinitrate (ISORDIL) 10 MG tablet TAKE 1 TABLET BY MOUTH TWICE A DAY  60 tablet  0  . losartan (COZAAR) 50 MG tablet Take 1 tablet (50 mg total) by mouth daily.  30 tablet  5  . LYRICA 150 MG capsule TAKE ONE CAPSULE BY MOUTH 3 TIMES A DAY  90 capsule  5  . nitroGLYCERIN (NITROSTAT) 0.4 MG SL tablet Place 0.4 mg under the tongue every 5 (five) minutes as needed for chest pain.       Marland Kitchen PARoxetine (PAXIL-CR) 25 MG 24 hr tablet TAKE 1 TABLET BY MOUTH EVERY DAY  30 tablet  5  . PARoxetine (PAXIL-CR) 37.5 MG 24 hr tablet TAKE 1 TABLET BY MOUTH DAILY  30 tablet  5  . carvedilol (COREG) 25 MG tablet TAKE 1 TABLET BY MOUTH 2 TIMES DAILY WITH A MEAL.  60 tablet  0   No current facility-administered medications for this visit.    History   Social History  . Marital Status: Married    Spouse Name: N/A    Number of Children: 3  . Years of Education: 12th   Occupational History  .      unemploye   Social History Main Topics  . Smoking status: Former Smoker  Quit date: 05/26/1997  . Smokeless tobacco: Not on file  . Alcohol Use: No  . Drug Use: No  . Sexual Activity: Not on file   Other Topics Concern  . Not on file   Social History Narrative   Pt lives at home with her spouse.   Caffeine Use- 3 sodas daily    Family History  Problem Relation Age of Onset  . Coronary artery disease    . Heart attack Mother     Past Medical History  Diagnosis Date  . Tachycardia     Sinus tachycardia  . Fluid overload   . Overweight(278.02)   . DM (diabetes mellitus)   . Dyslipidemia   . SOB (shortness of breath)   . CAD (coronary artery disease)     MI in 2000 - MI  2007 - treated bare metal stent (no nuclear since then as 9/11)  . HTN (hypertension)   . Anemia   . Osteoarthritis   . Sinusitis 1/12  . Ejection fraction     good,echo,2007 / 60-65%, echo, September 19, 2010  . RBBB (right bundle branch block)     Old  . Renal insufficiency     June, 2012, with relative  intravascular dehydration and glucose out of control.  Peak creatinine 2.6  . Chest x-ray abnormality     June, 2012, questionable abnormality /  repeat chest x-ray done January 29, 2011,, the film says comparison to October 31, 2010, normal chest x-ray.  . Urinary incontinence     November, 2013  . Obesity   . H/O cold sores   . Anxiety and depression   . Acute MI     X2  . Syncope     likely due to low blood sugar  . Stroke     mini strokes  . Hematuria   . Carotid artery disease     Past Surgical History  Procedure Laterality Date  . Left knee surgery  Left 10/25/2006  . Cardiac surgery      with 2 stents  . Abdominal hysterectomy      Patient Active Problem List   Diagnosis Date Noted  . Chronic diastolic CHF (congestive heart failure) 05/06/2013  . Carotid artery disease   . Dehydration 12/25/2012  . ARF (acute renal failure) 12/25/2012  . Hypotension 12/25/2012  . Diabetes mellitus 12/25/2012  . Generalized weakness 12/25/2012  . Sepsis 12/25/2012  . Hypokalemia 12/25/2012  . Hyponatremia 12/25/2012  . Anxiety and depression 12/25/2012  . Obesity   . Urinary incontinence   . Chest x-ray abnormality   . Renal insufficiency   . Fever blister 11/12/2010  . Depression 11/12/2010  . Ejection fraction   . RBBB (right bundle branch block)   . Tachycardia   . CAD (coronary artery disease)   . DM 01/22/2009  . DYSLIPIDEMIA 01/22/2009  . Fluid overload 01/22/2009  . OVERWEIGHT 01/22/2009  . HYPERTENSION 01/22/2009  . SHORTNESS OF BREATH 01/22/2009    ROS   patient denies fever, chills, headache, sweats, rash, change in vision, change in hearing, chest pain, cough, nausea or vomiting, urinary symptoms. All other systems are reviewed and are negative.  PHYSICAL EXAM  patient is oriented to person time and place. Affect is normal. She is overweight. There is no jugulovenous distention. Lungs are clear. Respiratory effort is nonlabored. Cardiac exam reveals S1 and  S2. The abdomen is soft. She does have 1-2+ peripheral edema. There no musculoskeletal deformities. There are no skin rashes.  Filed Vitals:   05/06/13 1517  BP: 140/78  Pulse: 103  Height: 5\' 6"  (1.676 m)  Weight: 264 lb (119.75 kg)    I reviewed the EKG from her hospitalization in August. She has sinus rhythm at that time. There is an old right bundle branch block.   ASSESSMENT & PLAN

## 2013-05-06 NOTE — Assessment & Plan Note (Signed)
Blood pressure is controlled. No change in therapy. 

## 2013-05-06 NOTE — Assessment & Plan Note (Signed)
Fortunately her ejection fraction has been normal historically. Her last echo was December, 2013. With fluid retention we will need to reassess her LV function.

## 2013-05-06 NOTE — Assessment & Plan Note (Signed)
I outlined above the fact that the patient is fluid overloaded. I have adjusted her diuretics and also your for followup.  As part of today's evaluation I spent greater than 25 minutes under total care. I reviewed all of her old records. More than half of 25 minutes was spent with a rate care with her talking about all of her issues and examining her.

## 2013-05-06 NOTE — Assessment & Plan Note (Signed)
The patient does have coronary disease. This has been stable. No further workup at this time.

## 2013-05-07 LAB — BASIC METABOLIC PANEL
BUN: 34 mg/dL — ABNORMAL HIGH (ref 6–23)
CO2: 28 mEq/L (ref 19–32)
Calcium: 9.1 mg/dL (ref 8.4–10.5)
Chloride: 95 mEq/L — ABNORMAL LOW (ref 96–112)
Creat: 1.24 mg/dL — ABNORMAL HIGH (ref 0.50–1.10)
Glucose, Bld: 349 mg/dL — ABNORMAL HIGH (ref 70–99)
Potassium: 4.5 mEq/L (ref 3.5–5.3)
Sodium: 129 mEq/L — ABNORMAL LOW (ref 135–145)

## 2013-05-18 ENCOUNTER — Other Ambulatory Visit: Payer: Self-pay | Admitting: Family Medicine

## 2013-05-20 NOTE — Telephone Encounter (Signed)
Medication refilled per protocol. 

## 2013-05-23 ENCOUNTER — Ambulatory Visit (HOSPITAL_COMMUNITY): Payer: Commercial Managed Care - PPO | Attending: Cardiology | Admitting: Radiology

## 2013-05-23 ENCOUNTER — Encounter: Payer: Self-pay | Admitting: Cardiology

## 2013-05-23 DIAGNOSIS — I252 Old myocardial infarction: Secondary | ICD-10-CM | POA: Insufficient documentation

## 2013-05-23 DIAGNOSIS — R609 Edema, unspecified: Secondary | ICD-10-CM | POA: Insufficient documentation

## 2013-05-23 DIAGNOSIS — E119 Type 2 diabetes mellitus without complications: Secondary | ICD-10-CM | POA: Insufficient documentation

## 2013-05-23 DIAGNOSIS — I959 Hypotension, unspecified: Secondary | ICD-10-CM | POA: Insufficient documentation

## 2013-05-23 DIAGNOSIS — E785 Hyperlipidemia, unspecified: Secondary | ICD-10-CM | POA: Insufficient documentation

## 2013-05-23 DIAGNOSIS — I079 Rheumatic tricuspid valve disease, unspecified: Secondary | ICD-10-CM | POA: Insufficient documentation

## 2013-05-23 DIAGNOSIS — R000 Tachycardia, unspecified: Secondary | ICD-10-CM | POA: Insufficient documentation

## 2013-05-23 DIAGNOSIS — Z6841 Body Mass Index (BMI) 40.0 and over, adult: Secondary | ICD-10-CM | POA: Insufficient documentation

## 2013-05-23 DIAGNOSIS — I4510 Unspecified right bundle-branch block: Secondary | ICD-10-CM | POA: Insufficient documentation

## 2013-05-23 DIAGNOSIS — R0602 Shortness of breath: Secondary | ICD-10-CM

## 2013-05-23 DIAGNOSIS — I10 Essential (primary) hypertension: Secondary | ICD-10-CM | POA: Insufficient documentation

## 2013-05-23 DIAGNOSIS — E669 Obesity, unspecified: Secondary | ICD-10-CM | POA: Insufficient documentation

## 2013-05-23 DIAGNOSIS — Z87891 Personal history of nicotine dependence: Secondary | ICD-10-CM | POA: Insufficient documentation

## 2013-05-23 DIAGNOSIS — I2510 Atherosclerotic heart disease of native coronary artery without angina pectoris: Secondary | ICD-10-CM | POA: Insufficient documentation

## 2013-05-23 NOTE — Progress Notes (Signed)
Echocardiogram performed.  

## 2013-06-02 ENCOUNTER — Encounter: Payer: Self-pay | Admitting: Cardiology

## 2013-06-03 ENCOUNTER — Ambulatory Visit (INDEPENDENT_AMBULATORY_CARE_PROVIDER_SITE_OTHER): Payer: Commercial Managed Care - PPO | Admitting: Cardiology

## 2013-06-03 ENCOUNTER — Encounter: Payer: Self-pay | Admitting: Cardiology

## 2013-06-03 VITALS — BP 148/92 | HR 105 | Ht 66.0 in | Wt 272.0 lb

## 2013-06-03 DIAGNOSIS — I5022 Chronic systolic (congestive) heart failure: Secondary | ICD-10-CM | POA: Insufficient documentation

## 2013-06-03 DIAGNOSIS — N182 Chronic kidney disease, stage 2 (mild): Secondary | ICD-10-CM

## 2013-06-03 DIAGNOSIS — I509 Heart failure, unspecified: Secondary | ICD-10-CM

## 2013-06-03 DIAGNOSIS — I10 Essential (primary) hypertension: Secondary | ICD-10-CM

## 2013-06-03 DIAGNOSIS — I5023 Acute on chronic systolic (congestive) heart failure: Secondary | ICD-10-CM | POA: Insufficient documentation

## 2013-06-03 DIAGNOSIS — I5042 Chronic combined systolic (congestive) and diastolic (congestive) heart failure: Secondary | ICD-10-CM

## 2013-06-03 MED ORDER — CHLORTHALIDONE 25 MG PO TABS: 25.0000 mg | ORAL_TABLET | Freq: Every day | ORAL | Status: DC

## 2013-06-03 MED ORDER — FUROSEMIDE 40 MG PO TABS: 80.0000 mg | ORAL_TABLET | Freq: Two times a day (BID) | ORAL | Status: DC

## 2013-06-03 NOTE — Progress Notes (Signed)
HPI  Patient is seen today to followup combined systolic and diastolic CHF. I saw her last May 06, 2013. She had ongoing edema. I increased her diuretic. She said that her weight has not changed significantly. She did mention that she's had some Gatorade to drink for lunch. I explained to her that this is not the type of fluid that I would want her to drink. We had an extensive discussion about limiting her salt and overall fluid intake. She has shortness of breath. She has 2+ bilateral pitting edema. She has redness in the skin of her legs related to her edema.  Since her last visit two-dimensional echo was done. Her ejection fraction has decreased somewhat to 45-50%. There no definite focal wall motion abnormalities that are new.  Allergies  Allergen Reactions  . Codeine Nausea And Vomiting    Current Outpatient Prescriptions  Medication Sig Dispense Refill  . acyclovir (ZOVIRAX) 400 MG tablet TAKE 1 TABLET BY MOUTH 3 TIMES A DAY  21 tablet  2  . aspirin EC 81 MG tablet Take 81 mg by mouth daily.      Marland Kitchen atorvastatin (LIPITOR) 80 MG tablet TAKE 1 TABLET (80 MG TOTAL) BY MOUTH DAILY.  30 tablet  0  . carvedilol (COREG) 12.5 MG tablet TAKE 1 TABLET BY MOUTH 2 TIMES DAILY WITH A MEAL.  60 tablet  11  . clonazePAM (KLONOPIN) 0.5 MG tablet TAKE 1 TABLET BY MOUTH 3 TIMES A DAY AS NEEDED  90 tablet  1  . clopidogrel (PLAVIX) 75 MG tablet Take 75 mg by mouth daily.      . fish oil-omega-3 fatty acids 1000 MG capsule Take 1 g by mouth daily.       . furosemide (LASIX) 40 MG tablet TAKE 2 TABLETS BY MOUTH EVERY DAY IN THE MORNING AND 1TABLET IN THE EVENING      . insulin NPH (HUMULIN N,NOVOLIN N) 100 UNIT/ML injection Inject 40 Units into the skin 2 (two) times daily at 8 am and 10 pm.       . insulin regular (NOVOLIN R,HUMULIN R) 100 units/mL injection Inject 10 Units into the skin 3 (three) times daily before meals.      . isosorbide dinitrate (ISORDIL) 10 MG tablet TAKE 1 TABLET BY MOUTH  TWICE A DAY  60 tablet  0  . losartan (COZAAR) 50 MG tablet TAKE 1 TABLET BY MOUTH DAILY  30 tablet  5  . LYRICA 150 MG capsule TAKE ONE CAPSULE BY MOUTH 3 TIMES A DAY  90 capsule  5  . nitroGLYCERIN (NITROSTAT) 0.4 MG SL tablet Place 1 tablet (0.4 mg total) under the tongue every 5 (five) minutes as needed for chest pain.  25 tablet  3  . PARoxetine (PAXIL-CR) 25 MG 24 hr tablet TAKE 1 TABLET BY MOUTH EVERY DAY  30 tablet  5  . PARoxetine (PAXIL-CR) 37.5 MG 24 hr tablet TAKE 1 TABLET BY MOUTH DAILY  30 tablet  5  . triamcinolone cream (KENALOG) 0.1 % Apply 1 application topically 2 (two) times daily.       No current facility-administered medications for this visit.    History   Social History  . Marital Status: Married    Spouse Name: N/A    Number of Children: 3  . Years of Education: 12th   Occupational History  .      unemploye   Social History Main Topics  . Smoking status: Former Smoker  Quit date: 05/26/1997  . Smokeless tobacco: Not on file  . Alcohol Use: No  . Drug Use: No  . Sexual Activity: Not on file   Other Topics Concern  . Not on file   Social History Narrative   Pt lives at home with her spouse.   Caffeine Use- 3 sodas daily    Family History  Problem Relation Age of Onset  . Coronary artery disease    . Heart attack Mother     Past Medical History  Diagnosis Date  . Tachycardia     Sinus tachycardia  . Fluid overload   . Overweight(278.02)   . DM (diabetes mellitus)   . Dyslipidemia   . SOB (shortness of breath)   . CAD (coronary artery disease)     MI in 2000 - MI  2007 - treated bare metal stent (no nuclear since then as 9/11)  . HTN (hypertension)   . Anemia   . Osteoarthritis   . Sinusitis 1/12  . Ejection fraction     good,echo,2007 / 60-65%, echo, September 19, 2010  . RBBB (right bundle branch block)     Old  . Renal insufficiency     June, 2012, with relative intravascular dehydration and glucose out of control.  Peak  creatinine 2.6  . Chest x-ray abnormality     June, 2012, questionable abnormality /  repeat chest x-ray done January 29, 2011,, the film says comparison to October 31, 2010, normal chest x-ray.  . Urinary incontinence     November, 2013  . Obesity   . H/O cold sores   . Anxiety and depression   . Acute MI     X2  . Syncope     likely due to low blood sugar  . Stroke     mini strokes  . Hematuria   . Carotid artery disease     Past Surgical History  Procedure Laterality Date  . Left knee surgery  Left 10/25/2006  . Cardiac surgery      with 2 stents  . Abdominal hysterectomy      Patient Active Problem List   Diagnosis Date Noted  . Chronic combined systolic and diastolic CHF (congestive heart failure) 06/03/2013  . Chronic diastolic CHF (congestive heart failure) 05/06/2013  . Carotid artery disease   . Dehydration 12/25/2012  . ARF (acute renal failure) 12/25/2012  . Hypotension 12/25/2012  . Diabetes mellitus 12/25/2012  . Generalized weakness 12/25/2012  . Sepsis 12/25/2012  . Hypokalemia 12/25/2012  . Hyponatremia 12/25/2012  . Anxiety and depression 12/25/2012  . Obesity   . Urinary incontinence   . Chest x-ray abnormality   . Renal insufficiency   . Fever blister 11/12/2010  . Depression 11/12/2010  . Ejection fraction   . RBBB (right bundle branch block)   . Tachycardia   . CAD (coronary artery disease)   . DM 01/22/2009  . DYSLIPIDEMIA 01/22/2009  . Fluid overload 01/22/2009  . OVERWEIGHT 01/22/2009  . HYPERTENSION 01/22/2009  . SHORTNESS OF BREATH 01/22/2009    ROS   Patient denies fever, chills, headache, sweats, change in vision, change in hearing, nausea vomiting, urinary symptoms. All other systems are reviewed and are negative.  PHYSICAL EXAM Patient's weight is up since her last visit. Today she weighs 272 pounds in our office. There is no jugulovenous distention. Lungs are clear. Respiratory effort is nonlabored. Cardiac exam reveals S1 and  S2. The abdomen is soft. Patient has 2+ bilateral peripheral edema with skin  redness.  Filed Vitals:   06/03/13 1546  BP: 148/92  Pulse: 105  Height: 5\' 6"  (1.676 m)  Weight: 272 lb (123.378 kg)   EKG is done today and reviewed by me. She has old right bundle branch block. There is mild sinus tachycardia with a rate of 105.  ASSESSMENT & PLAN

## 2013-06-03 NOTE — Assessment & Plan Note (Signed)
The patient does have chronic kidney disease. Her renal function will have to be watched carefully with further diuresis. Chemistry will be checked today.

## 2013-06-03 NOTE — Assessment & Plan Note (Signed)
Diuretics will be increased to Lasix 80 twice a day and chlorthalidone added 25 mg each morning along with 80 mg of Lasix. She will weight herself daily. We will start to call her at home to assess her weights. If I cannot make progress with this program I will consider referral to the chronic heart failure clinic.

## 2013-06-03 NOTE — Patient Instructions (Signed)
**Note De-Identified  Obfuscation** Your physician has recommended you make the following change in your medication: increase Lasix to 80 mg twice daily and start taking Chlorthalidone 25 mg daily. If you lose 10 pounds please do not take Chlorthalidone.  Your physician recommends that you weigh, daily, at the same time every day, and in the same amount of clothing. Please record your daily weights on the handout provided and bring it to your next appointment.  Your physician recommends that you schedule a follow-up appointment in: Feb. 6 2015  Please call [facility] (Dr Ron Parker Nurse) on Monday @ [phone] to discuss weight

## 2013-06-03 NOTE — Assessment & Plan Note (Signed)
Blood pressure is upper normal today. No change in therapy other than increasing her diuresis.

## 2013-06-15 ENCOUNTER — Other Ambulatory Visit: Payer: Self-pay | Admitting: Physician Assistant

## 2013-06-15 NOTE — Telephone Encounter (Signed)
Medication refilled per protocol. 

## 2013-06-17 ENCOUNTER — Other Ambulatory Visit: Payer: Self-pay

## 2013-06-17 MED ORDER — ATORVASTATIN CALCIUM 80 MG PO TABS: ORAL_TABLET | ORAL | Status: DC

## 2013-06-17 MED ORDER — ISOSORBIDE DINITRATE 10 MG PO TABS: ORAL_TABLET | ORAL | Status: DC

## 2013-06-21 ENCOUNTER — Other Ambulatory Visit: Payer: Self-pay | Admitting: Cardiology

## 2013-06-21 ENCOUNTER — Other Ambulatory Visit: Payer: Self-pay | Admitting: Family Medicine

## 2013-06-21 NOTE — Telephone Encounter (Signed)
Medication refilled per protocol. 

## 2013-07-01 ENCOUNTER — Ambulatory Visit (INDEPENDENT_AMBULATORY_CARE_PROVIDER_SITE_OTHER): Payer: Commercial Managed Care - PPO | Admitting: Cardiology

## 2013-07-01 ENCOUNTER — Encounter: Payer: Self-pay | Admitting: Cardiology

## 2013-07-01 VITALS — BP 132/78 | HR 101 | Ht 66.0 in | Wt 282.0 lb

## 2013-07-01 DIAGNOSIS — N289 Disorder of kidney and ureter, unspecified: Secondary | ICD-10-CM

## 2013-07-01 DIAGNOSIS — R0989 Other specified symptoms and signs involving the circulatory and respiratory systems: Secondary | ICD-10-CM

## 2013-07-01 DIAGNOSIS — I2510 Atherosclerotic heart disease of native coronary artery without angina pectoris: Secondary | ICD-10-CM

## 2013-07-01 DIAGNOSIS — I509 Heart failure, unspecified: Secondary | ICD-10-CM

## 2013-07-01 DIAGNOSIS — R9430 Abnormal result of cardiovascular function study, unspecified: Secondary | ICD-10-CM

## 2013-07-01 DIAGNOSIS — I5042 Chronic combined systolic (congestive) and diastolic (congestive) heart failure: Secondary | ICD-10-CM

## 2013-07-01 DIAGNOSIS — E119 Type 2 diabetes mellitus without complications: Secondary | ICD-10-CM

## 2013-07-01 NOTE — Patient Instructions (Signed)
Schedule appointment with Heart Failure Clinic     Your physician recommends that you schedule a follow-up appointment in: 8 weeks     NO GATORADE   LIMIT SALT and FLUIDS

## 2013-07-01 NOTE — Assessment & Plan Note (Signed)
There has been some decrease in ejection fraction by her last echo in December, 2014. With this in mind it would be reasonable to proceed with further ischemia testing.

## 2013-07-01 NOTE — Assessment & Plan Note (Signed)
I think it is possible that her approach to diabetes may be playing a role with her weight gain but I'm not sure.

## 2013-07-01 NOTE — Assessment & Plan Note (Signed)
Most recently it appeared that volume overload was the major issue. I continue to push her diuretics and her weight continues to go up. I'm not sure how to proceed at this point. I decided to ask for help from the advanced heart failure team. It will be helpful to me to have a whole new team take a fresh look at this patient and reassess her overall status. Followup labs and possible invasive testing may be appropriate. I look forward to their input.  As part of today's evaluation I spent greater than 25 minutes with her total care. More than half of this time has been with direct discussion with her about her symptoms and weight gain. We again spent a prolonged period of time talking about salt and fluid limitation.

## 2013-07-01 NOTE — Progress Notes (Signed)
HPI  Susan [name] is seen today to followup her volume overload. I have added chlorthalidone to her medications. Despite this she has gained 10 pounds. I am unable to find an answer to her volume overload. I will refer her to the advanced heart failure clinic for help.  Despite talking with her many many times about salt and fluid, she still tells me that she drinks Gatorade with her breakfast and supper. I have told her previously that I do not want her drinking Gatorade and I have explained to her the issues of salt and fluid. However she claims that her overall salt and fluid intake is limited. Her last ejection fraction had shown a decrease to 45-50% range. There were no definite focal wall motion abnormalities.  Allergies  Allergen Reactions  . Codeine Nausea And Vomiting    Current Outpatient Prescriptions  Medication Sig Dispense Refill  . acyclovir (ZOVIRAX) 400 MG tablet TAKE 1 TABLET BY MOUTH 3 TIMES A DAY  21 tablet  2  . aspirin EC 81 MG tablet Take 81 mg by mouth daily.      Marland Kitchen atorvastatin (LIPITOR) 80 MG tablet TAKE 1 TABLET (80 MG TOTAL) BY MOUTH DAILY.  30 tablet  6  . carvedilol (COREG) 12.5 MG tablet TAKE 1 TABLET BY MOUTH 2 TIMES DAILY WITH A MEAL.  60 tablet  11  . chlorthalidone (HYGROTON) 25 MG tablet Take 1 tablet (25 mg total) by mouth daily.  30 tablet  2  . clonazePAM (KLONOPIN) 0.5 MG tablet TAKE 1 TABLET BY MOUTH 3 TIMES A DAY AS NEEDED  90 tablet  1  . clopidogrel (PLAVIX) 75 MG tablet Take 75 mg by mouth daily.      . fish oil-omega-3 fatty acids 1000 MG capsule Take 1 g by mouth daily.       . furosemide (LASIX) 40 MG tablet Take 2 tablets (80 mg total) by mouth 2 (two) times daily.  30 tablet  3  . insulin regular (NOVOLIN R,HUMULIN R) 100 units/mL injection Inject 10 Units into the skin 3 (three) times daily before meals.      . isosorbide dinitrate (ISORDIL) 10 MG tablet TAKE 1 TABLET BY MOUTH TWICE A DAY  60 tablet  6  . losartan (COZAAR) 50 MG tablet TAKE 1  TABLET BY MOUTH DAILY  30 tablet  5  . LYRICA 150 MG capsule TAKE ONE CAPSULE BY MOUTH 3 TIMES A DAY  90 capsule  5  . nitroGLYCERIN (NITROSTAT) 0.4 MG SL tablet Place 1 tablet (0.4 mg total) under the tongue every 5 (five) minutes as needed for chest pain.  25 tablet  3  . NON FORMULARY every 4 (four) hours. INHALER      . PARoxetine (PAXIL-CR) 25 MG 24 hr tablet TAKE 1 TABLET BY MOUTH EVERY DAY  30 tablet  5  . triamcinolone cream (KENALOG) 0.1 % Apply 1 application topically 2 (two) times daily.      Marland Kitchen PARoxetine (PAXIL-CR) 37.5 MG 24 hr tablet TAKE 1 TABLET BY MOUTH DAILY  30 tablet  5   No current facility-administered medications for this visit.    History   Social History  . Marital Status: Married    Spouse Name: N/A    Number of Children: 3  . Years of Education: 12th   Occupational History  .      unemploye   Social History Main Topics  . Smoking status: Former Smoker    Quit date:  05/26/1997  . Smokeless tobacco: Not on file  . Alcohol Use: No  . Drug Use: No  . Sexual Activity: Not on file   Other Topics Concern  . Not on file   Social History Narrative   Pt lives at home with her spouse.   Caffeine Use- 3 sodas daily    Family History  Problem Relation Age of Onset  . Coronary artery disease    . Heart attack Mother     Past Medical History  Diagnosis Date  . Tachycardia     Sinus tachycardia  . Fluid overload   . Overweight   . DM (diabetes mellitus)   . Dyslipidemia   . SOB (shortness of breath)   . CAD (coronary artery disease)     MI in 2000 - MI  2007 - treated bare metal stent (no nuclear since then as 9/11)  . HTN (hypertension)   . Anemia   . Osteoarthritis   . Sinusitis 1/12  . Ejection fraction     good,echo,2007 / 60-65%, echo, September 19, 2010  . RBBB (right bundle branch block)     Old  . Renal insufficiency     June, 2012, with relative intravascular dehydration and glucose out of control.  Peak creatinine 2.6  . Chest x-ray  abnormality     June, 2012, questionable abnormality /  repeat chest x-ray done January 29, 2011,, the film says comparison to October 31, 2010, normal chest x-ray.  . Urinary incontinence     November, 2013  . Obesity   . H/O cold sores   . Anxiety and depression   . Acute MI     X2  . Syncope     likely due to low blood sugar  . Stroke     mini strokes  . Hematuria   . Carotid artery disease     Past Surgical History  Procedure Laterality Date  . Left knee surgery  Left 10/25/2006  . Cardiac surgery      with 2 stents  . Abdominal hysterectomy      Susan [name] Active Problem List   Diagnosis Date Noted  . Chronic combined systolic and diastolic CHF (congestive heart failure) 06/03/2013  . CKD (chronic kidney disease), stage II 06/03/2013  . Chronic diastolic CHF (congestive heart failure) 05/06/2013  . Carotid artery disease   . Dehydration 12/25/2012  . ARF (acute renal failure) 12/25/2012  . Hypotension 12/25/2012  . Diabetes mellitus 12/25/2012  . Generalized weakness 12/25/2012  . Sepsis 12/25/2012  . Hypokalemia 12/25/2012  . Hyponatremia 12/25/2012  . Anxiety and depression 12/25/2012  . Obesity   . Urinary incontinence   . Chest x-ray abnormality   . Renal insufficiency   . Fever blister 11/12/2010  . Depression 11/12/2010  . Ejection fraction   . RBBB (right bundle branch block)   . Tachycardia   . CAD (coronary artery disease)   . DM 01/22/2009  . DYSLIPIDEMIA 01/22/2009  . Fluid overload 01/22/2009  . OVERWEIGHT 01/22/2009  . HYPERTENSION 01/22/2009  . SHORTNESS OF BREATH 01/22/2009    ROS   Susan [name] denies fever, chills, headache, sweats, rash, change in vision, change in hearing, chest pain, cough, nausea vomiting, urinary symptoms. All other systems are reviewed and are negative.  PHYSICAL EXAM  Susan [name] is oriented to person time and place. Affect is normal. Her weight is now up an additional 10 pounds to 282 pounds. This is despite adding  chlorthalidone to her diuretics. Her lungs reveal  scattered rhonchi. Cardiac exam reveals S1 and S2. The abdomen is soft. There is peripheral edema. There are no musculoskeletal deformities. There are no skin rashes.  Filed Vitals:   07/01/13 1513  BP: 132/78  Pulse: 101  Height: 5\' 6"  (1.676 m)  Weight: 282 lb (127.914 kg)  SpO2: 98%     ASSESSMENT & PLAN

## 2013-07-01 NOTE — Assessment & Plan Note (Signed)
Her most recent creatinine had improved. But it has not been rechecked since December.

## 2013-07-01 NOTE — Assessment & Plan Note (Signed)
The patient has coronary disease. She did receive a bare-metal stent in 2007. With complete evaluation today I realize that I have not done any exercise testing. She has not had symptoms of chest discomfort.

## 2013-07-06 ENCOUNTER — Telehealth: Payer: Self-pay | Admitting: Cardiology

## 2013-07-06 NOTE — Telephone Encounter (Signed)
The dentist's office called today because pt was there for dental filing and the procedure was not finish because pt was short of breath. I called the pt she states she has been SOB and sleeps in a recliner during the night because she has problems breathing when In bed. Pt was seen in Dr. Kae Heller office  Last Friday and was referred to Heart and vascular at [place]. Pt has the appointment for 07/13/13. Pt states will keep the appointment.

## 2013-07-06 NOTE — Telephone Encounter (Signed)
New Problem:  Amy from Mrs. Sabina's dentist office states the pt was too SOB to have her fillings done... Amy states the pt was unstable. BP 166/96 HR was 102 while at the dentist office... States the pt's was extremely out of breath and unsteady. Dr. Melven Sartorius is requesting Dr. Ron Parker or one of his nurses reach out to the patient.

## 2013-07-13 ENCOUNTER — Ambulatory Visit (HOSPITAL_COMMUNITY)
Admission: RE | Admit: 2013-07-13 | Discharge: 2013-07-13 | Disposition: A | Discharge status: Home / Self Care | Payer: Commercial Managed Care - PPO | Source: Ambulatory Visit | Attending: Cardiology | Admitting: Cardiology

## 2013-07-13 VITALS — BP 142/74 | HR 78 | Wt 285.0 lb

## 2013-07-13 DIAGNOSIS — I5022 Chronic systolic (congestive) heart failure: Secondary | ICD-10-CM | POA: Insufficient documentation

## 2013-07-13 DIAGNOSIS — R0989 Other specified symptoms and signs involving the circulatory and respiratory systems: Secondary | ICD-10-CM | POA: Insufficient documentation

## 2013-07-13 DIAGNOSIS — E039 Hypothyroidism, unspecified: Secondary | ICD-10-CM | POA: Insufficient documentation

## 2013-07-13 DIAGNOSIS — R0609 Other forms of dyspnea: Secondary | ICD-10-CM | POA: Insufficient documentation

## 2013-07-13 DIAGNOSIS — Z8673 Personal history of transient ischemic attack (TIA), and cerebral infarction without residual deficits: Secondary | ICD-10-CM | POA: Insufficient documentation

## 2013-07-13 DIAGNOSIS — I5032 Chronic diastolic (congestive) heart failure: Secondary | ICD-10-CM

## 2013-07-13 DIAGNOSIS — E8779 Other fluid overload: Secondary | ICD-10-CM | POA: Insufficient documentation

## 2013-07-13 DIAGNOSIS — I2510 Atherosclerotic heart disease of native coronary artery without angina pectoris: Secondary | ICD-10-CM | POA: Insufficient documentation

## 2013-07-13 DIAGNOSIS — I252 Old myocardial infarction: Secondary | ICD-10-CM | POA: Insufficient documentation

## 2013-07-13 DIAGNOSIS — E669 Obesity, unspecified: Secondary | ICD-10-CM

## 2013-07-13 DIAGNOSIS — I509 Heart failure, unspecified: Secondary | ICD-10-CM

## 2013-07-13 MED ORDER — POTASSIUM CHLORIDE ER 20 MEQ PO TBCR: 20.0000 meq | EXTENDED_RELEASE_TABLET | Freq: Two times a day (BID) | ORAL | Status: DC

## 2013-07-13 MED ORDER — METOLAZONE 5 MG PO TABS: 5.0000 mg | ORAL_TABLET | ORAL | Status: DC | PRN

## 2013-07-13 MED ORDER — TORSEMIDE 20 MG PO TABS: 40.0000 mg | ORAL_TABLET | Freq: Two times a day (BID) | ORAL | Status: DC

## 2013-07-13 NOTE — Patient Instructions (Signed)
Follow up Monday  Stop lasix   Take Torsemide 40 mg twice a day  Take 20 meq potassium twice a day  Take 5 mg Metolazone daily for 3 days   Do the following things EVERYDAY: 1) Weigh yourself in the morning before breakfast. Write it down and keep it in a log. 2) Take your medicines as prescribed 3) Eat low salt foods-Limit salt (sodium) to 2000 mg per day.  4) Stay as active as you can everyday 5) Limit all fluids for the day to less than 2 liters

## 2013-07-13 NOTE — Progress Notes (Signed)
Patient ID: Susan [name], female   DOB: 1950-06-14, 63 y.o.   MRN: [id number]  Weight Range   Baseline proBNP   PCP:  Cardiologist: Dr Myrtis Ser  Endocrinologist: Dr Lurene Shadow   HPI: Susan [name] is a 63 year old with a history of RBBB, DM, Hypothyroid, diastolic heart failure EF 2014 40-45% (2012 60%) , CAD MI 2000/2007 BMS 2007, TIA, obesity.   She is referred to the HF clinic by Dr Myrtis Ser for increased dyspnea and volume overload. She saw Dr Myrtis Ser 2/6 and at that time lasix was increased to 120 mg in am 40 mg in pm but her weight did not go down.  Says she has gained over 30 pounds in 3 months. Weight at home trending up from 252-276 pounds.  Progressive dyspnea with exertion over the last 3 months. She requires multiple breaks at the grocery store. + Orthopnea. SOB with ADLs.  Sleeps in a recliner. Occasional has chest pain. Complains of lower extremity edema. Compliant with medications.  SH: Former smoker quit 15 year ago. Does drink alcohol. She is disabled. Lives at home with her husband and disabled son - he has a brain injury after he attempted suicide.   FH: Mom MI  ROS: All systems negative except as listed in HPI, PMH and Problem List.  Past Medical History  Diagnosis Date  . Tachycardia     Sinus tachycardia  . Fluid overload   . Overweight   . DM (diabetes mellitus)   . Dyslipidemia   . SOB (shortness of breath)   . CAD (coronary artery disease)     MI in 2000 - MI  2007 - treated bare metal stent (no nuclear since then as 9/11)  . HTN (hypertension)   . Anemia   . Osteoarthritis   . Sinusitis 1/12  . Ejection fraction     good,echo,2007 / 60-65%, echo, September 19, 2010  . RBBB (right bundle branch block)     Old  . Renal insufficiency     June, 2012, with relative intravascular dehydration and glucose out of control.  Peak creatinine 2.6  . Chest x-ray abnormality     June, 2012, questionable abnormality /  repeat chest x-ray done January 29, 2011,, the film says comparison to  October 31, 2010, normal chest x-ray.  . Urinary incontinence     November, 2013  . Obesity   . H/O cold sores   . Anxiety and depression   . Acute MI     X2  . Syncope     likely due to low blood sugar  . Stroke     mini strokes  . Hematuria   . Carotid artery disease     Current Outpatient Prescriptions  Medication Sig Dispense Refill  . acyclovir (ZOVIRAX) 400 MG tablet TAKE 1 TABLET BY MOUTH 3 TIMES A Day as needed      . aspirin EC 81 MG tablet Take 81 mg by mouth daily.      Marland Kitchen atorvastatin (LIPITOR) 80 MG tablet TAKE 1 TABLET (80 MG TOTAL) BY MOUTH DAILY.  30 tablet  6  . carvedilol (COREG) 12.5 MG tablet TAKE 1 TABLET BY MOUTH 2 TIMES DAILY WITH A MEAL.  60 tablet  11  . chlorthalidone (HYGROTON) 25 MG tablet Take 1 tablet (25 mg total) by mouth daily.  30 tablet  2  . clonazePAM (KLONOPIN) 0.5 MG tablet TAKE 1 TABLET BY MOUTH 3 TIMES A DAY AS NEEDED  90 tablet  1  .  clopidogrel (PLAVIX) 75 MG tablet Take 75 mg by mouth daily.      . fish oil-omega-3 fatty acids 1000 MG capsule Take 1 g by mouth daily.       . furosemide (LASIX) 40 MG tablet Take by mouth. Take 120 mg in am and 40 mg in the evening.      . insulin NPH-regular Human (NOVOLIN 70/30) (70-30) 100 UNIT/ML injection Inject into the skin. Take 60 units in am , 30 units at lunch, and 60 units in the evening      . isosorbide dinitrate (ISORDIL) 10 MG tablet TAKE 1 TABLET BY MOUTH TWICE A DAY  60 tablet  6  . levothyroxine (SYNTHROID, LEVOTHROID) 50 MCG tablet Take 50 mcg by mouth daily before breakfast.      . losartan (COZAAR) 50 MG tablet TAKE 1 TABLET BY MOUTH DAILY  30 tablet  5  . LYRICA 150 MG capsule TAKE ONE CAPSULE BY MOUTH 3 TIMES A DAY  90 capsule  5  . nitroGLYCERIN (NITROSTAT) 0.4 MG SL tablet Place 1 tablet (0.4 mg total) under the tongue every 5 (five) minutes as needed for chest pain.  25 tablet  3  . NON FORMULARY every 4 (four) hours. INHALER      . PARoxetine (PAXIL-CR) 25 MG 24 hr tablet TAKE 1 TABLET  BY MOUTH EVERY DAY  30 tablet  5  . PARoxetine (PAXIL-CR) 37.5 MG 24 hr tablet TAKE 1 TABLET BY MOUTH DAILY  30 tablet  5  . triamcinolone cream (KENALOG) 0.1 % Apply 1 application topically 2 (two) times daily.      . insulin regular (NOVOLIN R,HUMULIN R) 100 units/mL injection Inject 10 Units into the skin 3 (three) times daily before meals.       No current facility-administered medications for this encounter.     PHYSICAL EXAM: Filed Vitals:   07/13/13 1327  BP: 142/74  Pulse: 78  Weight: 285 lb (129.275 kg)  SpO2: 98%    General:  Dyspneic ambulating to the clinic. Oxygen saturations 98% on room air.  HEENT: normal Neck: supple. JVP hard to tell due to body habitus. Carotids 2+ bilaterally; no bruits. No lymphadenopathy or thryomegaly appreciated. Cor: PMI normal. Regular rate & rhythm. No rubs, gallops or murmurs. Lungs: clear Abdomen: obese, soft, nontender, ++ distended. No hepatosplenomegaly. No bruits or masses. Good bowel sounds. Extremities: no cyanosis, clubbing, rash, R and LLE 2+ edema Neuro: alert & orientedx3, cranial nerves grossly intact. Moves all 4 extremities w/o difficulty. Affect pleasant.      ASSESSMENT & PLAN:  1. Chronic Systolic Heart Failure - ECHO EF 40-45% 2015 previously  EF was 50-55%  2013 and EF 2012 60-65%.. Weight has been trending up 50 pounds over the last 6 months. She has had a poor response to escalating po lasix. NYHA IIIb-IV. Volume status elevated. Stop lasix and start torsemide 40 mg twice a day and add 5  mg metolazone for the next 3 days. Add 20 meq potassium twice a day. Instructed to call HF clinic on Friday with weights. Provided with weight chart to record daily weights.  Offered hospital admit however she declines.  Discussed daily weights, low salt diet, and limiting fluid intake to < 2 liters per day.   2.. Dyspnea- Ambulated in hall oxygen saturations >88% but dyspneic with exertion. Will need to consider sleep study and  PFTs after volume status improves.    Follow up next week and check BMET.  CLEGG,AMY NP-C 1:57 PM  Patient seen and examined with Tonye Becket, NP. We discussed all aspects of the encounter. I agree with the assessment and plan as stated above.   She is markedly volume overloaded with progressive DOE. Now NYHA IIIB. We suggested hospital admission for IV diuresis but she refuses. Will switch lasix to demadex and add metolazone for several days. She will call us on Friday if no better for hospital admit. Otherwise, we will see her back next Monday to see how she is doing. Extensive HF education provided.   Time spent 45 minutes. Greater than half of the time spent in above discussions.   Kenyatta Gloeckner,MD 10:08 AM

## 2013-07-15 ENCOUNTER — Telehealth (HOSPITAL_COMMUNITY): Payer: Self-pay | Admitting: *Deleted

## 2013-07-15 NOTE — Telephone Encounter (Signed)
Pt called with update, she states yesterday her wt was 284 and she started Metolazone today wt is 275 lb and she feels much better, she will continue Metolazone today and tomorrow and see [name] on Mon

## 2013-07-18 ENCOUNTER — Ambulatory Visit (HOSPITAL_COMMUNITY)
Admission: RE | Admit: 2013-07-18 | Discharge: 2013-07-18 | Disposition: A | Discharge status: Home / Self Care | Payer: Commercial Managed Care - PPO | Source: Ambulatory Visit | Attending: Internal Medicine | Admitting: Internal Medicine

## 2013-07-18 ENCOUNTER — Telehealth (HOSPITAL_COMMUNITY): Payer: Self-pay | Admitting: *Deleted

## 2013-07-18 VITALS — BP 120/62 | HR 100 | Wt 273.0 lb

## 2013-07-18 DIAGNOSIS — I509 Heart failure, unspecified: Secondary | ICD-10-CM

## 2013-07-18 DIAGNOSIS — R0609 Other forms of dyspnea: Secondary | ICD-10-CM | POA: Insufficient documentation

## 2013-07-18 DIAGNOSIS — R0989 Other specified symptoms and signs involving the circulatory and respiratory systems: Secondary | ICD-10-CM

## 2013-07-18 DIAGNOSIS — R0683 Snoring: Secondary | ICD-10-CM

## 2013-07-18 DIAGNOSIS — I5042 Chronic combined systolic (congestive) and diastolic (congestive) heart failure: Secondary | ICD-10-CM

## 2013-07-18 LAB — BASIC METABOLIC PANEL
BUN: 75 mg/dL — ABNORMAL HIGH (ref 6–23)
CO2: 27 mEq/L (ref 19–32)
Calcium: 9.5 mg/dL (ref 8.4–10.5)
Chloride: 81 mEq/L — ABNORMAL LOW (ref 96–112)
Creatinine, Ser: 1.38 mg/dL — ABNORMAL HIGH (ref 0.50–1.10)
GFR calc Af Amer: 46 mL/min — ABNORMAL LOW (ref >90)
GFR calc non Af Amer: 39 mL/min — ABNORMAL LOW (ref >90)
Glucose, Bld: 1005 mg/dL (ref 70–99)
Potassium: 4.8 mEq/L (ref 3.7–5.3)
Sodium: 124 mEq/L — ABNORMAL LOW (ref 137–147)

## 2013-07-18 NOTE — Telephone Encounter (Signed)
Called pt with critical glucose level, and advised to report to nearest ER, she states she is almost home and stop by there first then will go to ER at [facility]

## 2013-07-18 NOTE — Patient Instructions (Addendum)
Follow up in 1 month   Only take metolazone if your weight is greater than 270 pounds.  You have been referred to Pulmonary  Do the following things EVERYDAY: 1) Weigh yourself in the morning before breakfast. Write it down and keep it in a log. 2) Take your medicines as prescribed 3) Eat low salt foods-Limit salt (sodium) to 2000 mg per day.  4) Stay as active as you can everyday 5) Limit all fluids for the day to less than 2 liters

## 2013-07-18 NOTE — Progress Notes (Signed)
Patient ID: Susan [name], female   DOB: 04/25/1951, 63 y.o.   MRN: [id number]   Weight Range   Baseline proBNP   PCP:  Cardiologist: Dr Myrtis Ser  Endocrinologist: Dr Lurene Shadow   HPI: Susan [name] is a 63 year old with a history of RBBB, DM, Hypothyroid, diastolic heart failure EF 2014 40-45% (2012 60%) , CAD MI 2000/2007 BMS 2007, TIA, obesity.   She ]returns for follow up. Last visit lasix stopped and she was started on torsemide 40 mg bid and metolazone 5 mg for 3 days. Says she has had good UOP. Overall she is feeling much better. Mild dyspnea with exertion. + Orthopnea sleeps in recliner.  Weight at home trending down from 284 to 265 pounds. Tries to drink < 2 liters per day. Tries to follow low salt diet. Compliant with medications.   SH: Former smoker quit 15 year ago. Does drink alcohol. She is disabled. Lives at home with her husband and disabled son - he has a brain injury after he attempted suicide.   FH: Mom MI  ROS: All systems negative except as listed in HPI, PMH and Problem List.  Past Medical History  Diagnosis Date  . Tachycardia     Sinus tachycardia  . Fluid overload   . Overweight   . DM (diabetes mellitus)   . Dyslipidemia   . SOB (shortness of breath)   . CAD (coronary artery disease)     MI in 2000 - MI  2007 - treated bare metal stent (no nuclear since then as 9/11)  . HTN (hypertension)   . Anemia   . Osteoarthritis   . Sinusitis 1/12  . Ejection fraction     good,echo,2007 / 60-65%, echo, September 19, 2010  . RBBB (right bundle branch block)     Old  . Renal insufficiency     June, 2012, with relative intravascular dehydration and glucose out of control.  Peak creatinine 2.6  . Chest x-ray abnormality     June, 2012, questionable abnormality /  repeat chest x-ray done January 29, 2011,, the film says comparison to October 31, 2010, normal chest x-ray.  . Urinary incontinence     November, 2013  . Obesity   . H/O cold sores   . Anxiety and depression   . Acute MI      X2  . Syncope     likely due to low blood sugar  . Stroke     mini strokes  . Hematuria   . Carotid artery disease     Current Outpatient Prescriptions  Medication Sig Dispense Refill  . acyclovir (ZOVIRAX) 400 MG tablet TAKE 1 TABLET BY MOUTH 3 TIMES A Day as needed      . aspirin EC 81 MG tablet Take 81 mg by mouth daily.      Marland Kitchen atorvastatin (LIPITOR) 80 MG tablet TAKE 1 TABLET (80 MG TOTAL) BY MOUTH DAILY.  30 tablet  6  . carvedilol (COREG) 12.5 MG tablet TAKE 1 TABLET BY MOUTH 2 TIMES DAILY WITH A MEAL.  60 tablet  11  . chlorthalidone (HYGROTON) 25 MG tablet Take 1 tablet (25 mg total) by mouth daily.  30 tablet  2  . clonazePAM (KLONOPIN) 0.5 MG tablet TAKE 1 TABLET BY MOUTH 3 TIMES A DAY AS NEEDED  90 tablet  1  . clopidogrel (PLAVIX) 75 MG tablet Take 75 mg by mouth daily.      . fish oil-omega-3 fatty acids 1000 MG capsule Take 1  g by mouth daily.       . insulin NPH-regular Human (NOVOLIN 70/30) (70-30) 100 UNIT/ML injection Inject into the skin. Take 60 units in am , 30 units at lunch, and 60 units in the evening      . insulin regular (NOVOLIN R,HUMULIN R) 100 units/mL injection Inject 10 Units into the skin 3 (three) times daily before meals.      . isosorbide dinitrate (ISORDIL) 10 MG tablet TAKE 1 TABLET BY MOUTH TWICE A DAY  60 tablet  6  . levothyroxine (SYNTHROID, LEVOTHROID) 50 MCG tablet Take 50 mcg by mouth daily before breakfast.      . losartan (COZAAR) 50 MG tablet TAKE 1 TABLET BY MOUTH DAILY  30 tablet  5  . LYRICA 150 MG capsule TAKE ONE CAPSULE BY MOUTH 3 TIMES A DAY  90 capsule  5  . metolazone (ZAROXOLYN) 5 MG tablet Take 1 tablet (5 mg total) by mouth as needed.  10 tablet  6  . nitroGLYCERIN (NITROSTAT) 0.4 MG SL tablet Place 1 tablet (0.4 mg total) under the tongue every 5 (five) minutes as needed for chest pain.  25 tablet  3  . NON FORMULARY every 4 (four) hours. INHALER      . PARoxetine (PAXIL-CR) 25 MG 24 hr tablet TAKE 1 TABLET BY MOUTH EVERY  DAY  30 tablet  5  . PARoxetine (PAXIL-CR) 37.5 MG 24 hr tablet TAKE 1 TABLET BY MOUTH DAILY  30 tablet  5  . potassium chloride 20 MEQ TBCR Take 20 mEq by mouth 2 (two) times daily.  60 tablet  6  . torsemide (DEMADEX) 20 MG tablet Take 2 tablets (40 mg total) by mouth 2 (two) times daily.  120 tablet  6  . triamcinolone cream (KENALOG) 0.1 % Apply 1 application topically 2 (two) times daily.       No current facility-administered medications for this encounter.     PHYSICAL EXAM: Filed Vitals:   07/18/13 1335  BP: 120/62  Pulse: 100  Weight: 273 lb (123.832 kg)  SpO2: 97%    General:  NAD walking in the clinic.  HEENT: normal Neck: supple. JVP hard to tell due to body habitus. Carotids 2+ bilaterally; no bruits. No lymphadenopathy or thryomegaly appreciated. Cor: PMI normal. Regular rate & rhythm. No rubs, gallops or murmurs. Lungs: clear Abdomen: obese, soft, nontender, nondistended. No hepatosplenomegaly. No bruits or masses. Good bowel sounds. Extremities: no cyanosis, clubbing, rash, R and LLE 1+ edema Neuro: alert & orientedx3, cranial nerves grossly intact. Moves all 4 extremities w/o difficulty. Affect pleasant.   ASSESSMENT & PLAN:  1. Chronic Systolic Heart Failure - ECHO EF 40-45% 2015 previously  EF was 50-55%  2013 and EF 2012 60-65%. Weight down 12 pounds. Would like to keep her dry weight at home < 270 pounds. Continue  torsemide 40 mg twice a day.  Continue metolazone 5 mg as needed for weight of 270 pounds or greater.  Add compression stockings.  Check BMET  Discussed daily weights, low salt diet, and limiting fluid intake to < 2 liters per day.   2.. Dyspnea- Overall she is much improved. Refer pulmonary. Will need to consider sleep study as she has day time fatigue and PFTs.     Follow up next week and check BMET.   CLEGG,AMY NP-C 1:49 PM   Patient seen and examined with Tonye Becket, NP. We discussed all aspects of the encounter. I agree with the  assessment and plan  as stated above. She has had a very good response to metolazone. Weight down 12 pounds. Still appears to have some volume on board. Will continue current regimen and watch response. Extensive HF education provided. Reinforced need for daily weights and reviewed use of sliding scale diuretics. Take metolazone as needed to keep weight < 270 - may need a regular dose once or twice per week. Agree with referral to pulmonary for sleep study and PFTs.   Reuel Boom Zawadi Aplin,MD 4:47 PM

## 2013-07-24 ENCOUNTER — Other Ambulatory Visit: Payer: Self-pay | Admitting: Physician Assistant

## 2013-07-24 DIAGNOSIS — F329 Major depressive disorder, single episode, unspecified: Secondary | ICD-10-CM

## 2013-07-24 DIAGNOSIS — F32A Depression, unspecified: Secondary | ICD-10-CM

## 2013-07-25 ENCOUNTER — Encounter: Payer: Self-pay | Admitting: Family Medicine

## 2013-07-25 NOTE — Telephone Encounter (Signed)
Medication refill for one time only.  Patient needs to be seen.  Letter sent for patient to call and schedule 

## 2013-08-04 ENCOUNTER — Other Ambulatory Visit: Payer: Self-pay | Admitting: Physician Assistant

## 2013-08-04 DIAGNOSIS — F418 Other specified anxiety disorders: Secondary | ICD-10-CM

## 2013-08-04 NOTE — Telephone Encounter (Signed)
Ok to refill??  Last office visit 12/29/2013.  Last refill 04/19/2014.

## 2013-08-05 NOTE — Telephone Encounter (Signed)
Rx called in 

## 2013-08-05 NOTE — Telephone Encounter (Signed)
ApProved for #90+2 additional refills

## 2013-08-10 ENCOUNTER — Encounter: Payer: Self-pay | Admitting: Physician Assistant

## 2013-08-10 ENCOUNTER — Ambulatory Visit (INDEPENDENT_AMBULATORY_CARE_PROVIDER_SITE_OTHER): Payer: Commercial Managed Care - PPO | Admitting: Physician Assistant

## 2013-08-10 VITALS — BP 112/76 | HR 88 | Temp 97.0°F | Resp 18 | Ht 66.0 in | Wt 274.0 lb

## 2013-08-10 DIAGNOSIS — I5042 Chronic combined systolic (congestive) and diastolic (congestive) heart failure: Secondary | ICD-10-CM

## 2013-08-10 DIAGNOSIS — F3289 Other specified depressive episodes: Secondary | ICD-10-CM

## 2013-08-10 DIAGNOSIS — I509 Heart failure, unspecified: Secondary | ICD-10-CM

## 2013-08-10 DIAGNOSIS — F341 Dysthymic disorder: Secondary | ICD-10-CM

## 2013-08-10 DIAGNOSIS — F419 Anxiety disorder, unspecified: Secondary | ICD-10-CM

## 2013-08-10 DIAGNOSIS — F32A Depression, unspecified: Secondary | ICD-10-CM

## 2013-08-10 DIAGNOSIS — I5032 Chronic diastolic (congestive) heart failure: Secondary | ICD-10-CM

## 2013-08-10 DIAGNOSIS — F418 Other specified anxiety disorders: Secondary | ICD-10-CM

## 2013-08-10 DIAGNOSIS — F329 Major depressive disorder, single episode, unspecified: Secondary | ICD-10-CM

## 2013-08-10 MED ORDER — CLONAZEPAM 1 MG PO TABS: 1.0000 mg | ORAL_TABLET | Freq: Three times a day (TID) | ORAL | Status: DC | PRN

## 2013-08-10 NOTE — Progress Notes (Signed)
Patient ID: Susan [name] MRN: [id number], DOB: [birth year], 63 y.o. Date of Encounter: @DATE @  Chief Complaint:  Chief Complaint  Patient presents with  . routine follow up    not fasting    HPI: 63 y.o. year old white female  presents for routine followup visit.  She has been seeing Cardiology on a routine basis.  Today she says that at her last appointment with cardiology they were referring her see a pulmonologist and she is asking me what a pulmonologist is. When I told her that this is a "lung doctor" she seems puzzled as to why she needs to see them. I then reviewed the last cardiology note. Reviewed they noted the patient has dyspnea and were sending her to pulmonary to have pulmonary function tests as well as possible sleep study. On reviewing the notes, also saw her they discussed her CHF and fluid overload. Noted that they had told her to use Zaroxolyn to keep weight less than 270. I noted that her weight today is 274. We then got into a discussion about using the Zaroxolyn appropriately. In our conversation, it is obvious that she has a very poor understanding of the proper use of this. Also, she says that on days when she has appointments, that she cannot take the medicine on those days. Says that she "would be wetting all over herself" if she took the pill prior to going out. And, she was under the understanding that she could only take that pill in the morning.  As well she is still seeing Susan [name] routinely to manage her diabetes and her thyroid.  Susan [name] prescribes her Lyrica.  The main medications which I am managing all her Paxil and her Klonopin. She says that her son lives with her now. He has brain injury which has occurred secondary to an attempted suicide.   She says that he wants to sleep all day and she struggles to get him to wake up and get out of the bed. Says that then, when he is awake, he requires constant attention. Says she will tell him to make sure  not to leave on the electric blanket but then he does. Says that she can't allow him to cook because he " burns up the pan." She tries to get him to go out with her when she has to do her errands et Susan [name]. However she says then she is having to chase him around the store and is losing him in the store etc. This is extremely stressful. Says that the current dose of Klonopin seems to be ineffective. Says that she feels that she really needs to have medication like that on hand to use because she  has times of feeling really panicked/stressed.    Past Medical History  Diagnosis Date  . Tachycardia     Sinus tachycardia  . Fluid overload   . Overweight   . DM (diabetes mellitus)   . Dyslipidemia   . SOB (shortness of breath)   . CAD (coronary artery disease)     MI in 2000 - MI  2007 - treated bare metal stent (no nuclear since then as 9/11)  . HTN (hypertension)   . Anemia   . Osteoarthritis   . Sinusitis 1/12  . Ejection fraction     good,echo,2007 / 60-65%, echo, September 19, 2010  . RBBB (right bundle branch block)     Old  . Renal insufficiency     June, 2012, with relative  intravascular dehydration and glucose out of control.  Peak creatinine 2.6  . Chest x-ray abnormality     June, 2012, questionable abnormality /  repeat chest x-ray done January 29, 2011,, the film says comparison to October 31, 2010, normal chest x-ray.  . Urinary incontinence     November, 2013  . Obesity   . H/O cold sores   . Anxiety and depression   . Acute MI     X2  . Syncope     likely due to low blood sugar  . Stroke     mini strokes  . Hematuria   . Carotid artery disease      Home Meds: See attached medication section for current medication list. Any medications entered into computer today will not appear on this note's list. The medications listed below were entered prior to today. Current Outpatient Prescriptions on File Prior to Visit  Medication Sig Dispense Refill  . acyclovir (ZOVIRAX) 400  MG tablet TAKE 1 TABLET BY MOUTH 3 TIMES A Day as needed      . aspirin EC 81 MG tablet Take 81 mg by mouth daily.      Marland Kitchen atorvastatin (LIPITOR) 80 MG tablet TAKE 1 TABLET (80 MG TOTAL) BY MOUTH DAILY.  30 tablet  6  . carvedilol (COREG) 12.5 MG tablet TAKE 1 TABLET BY MOUTH 2 TIMES DAILY WITH A MEAL.  60 tablet  11  . chlorthalidone (HYGROTON) 25 MG tablet Take 1 tablet (25 mg total) by mouth daily.  30 tablet  2  . clopidogrel (PLAVIX) 75 MG tablet Take 75 mg by mouth daily.      . fish oil-omega-3 fatty acids 1000 MG capsule Take 1 g by mouth daily.       . insulin NPH-regular Human (NOVOLIN 70/30) (70-30) 100 UNIT/ML injection Inject into the skin. Take 60 units in am , 30 units at lunch, and 60 units in the evening      . isosorbide dinitrate (ISORDIL) 10 MG tablet TAKE 1 TABLET BY MOUTH TWICE A DAY  60 tablet  6  . levothyroxine (SYNTHROID, LEVOTHROID) 50 MCG tablet Take 50 mcg by mouth daily before breakfast.      . losartan (COZAAR) 50 MG tablet TAKE 1 TABLET BY MOUTH DAILY  30 tablet  5  . LYRICA 150 MG capsule TAKE ONE CAPSULE BY MOUTH 3 TIMES A DAY  90 capsule  5  . metolazone (ZAROXOLYN) 5 MG tablet Take 1 tablet (5 mg total) by mouth as needed.  10 tablet  6  . nitroGLYCERIN (NITROSTAT) 0.4 MG SL tablet Place 1 tablet (0.4 mg total) under the tongue every 5 (five) minutes as needed for chest pain.  25 tablet  3  . PARoxetine (PAXIL-CR) 25 MG 24 hr tablet TAKE 1 TABLET BY MOUTH EVERY DAY  30 tablet  5  . PARoxetine (PAXIL-CR) 37.5 MG 24 hr tablet TAKE 1 TABLET BY MOUTH DAILY  30 tablet  0  . potassium chloride 20 MEQ TBCR Take 20 mEq by mouth 2 (two) times daily.  60 tablet  6  . torsemide (DEMADEX) 20 MG tablet Take 2 tablets (40 mg total) by mouth 2 (two) times daily.  120 tablet  6  . triamcinolone cream (KENALOG) 0.1 % Apply 1 application topically 2 (two) times daily.       No current facility-administered medications on file prior to visit.    Allergies:  Allergies    Allergen Reactions  . Codeine Nausea  And Vomiting    History   Social History  . Marital Status: Married    Spouse Name: N/A    Number of Children: 3  . Years of Education: 12th   Occupational History  .      unemploye   Social History Main Topics  . Smoking status: Former Smoker    Quit date: 05/26/1997  . Smokeless tobacco: Never Used  . Alcohol Use: No  . Drug Use: No  . Sexual Activity: Not on file   Other Topics Concern  . Not on file   Social History Narrative   Pt lives at home with her spouse.   Caffeine Use- 3 sodas daily    Family History  Problem Relation Age of Onset  . Coronary artery disease    . Heart attack Mother      Review of Systems:  See HPI for pertinent ROS. All other ROS negative.    Physical Exam: Blood pressure 112/76, pulse 88, temperature 97 F (36.1 C), temperature source Oral, resp. rate 18, height 5\' 6"  (1.676 m), weight 274 lb (124.286 kg)., Body mass index is 44.25 kg/(m^2). General:Obese WF. Appears in no acute distress. Neck: Supple. No thyromegaly. No lymphadenopathy. Lungs: Clear bilaterally to auscultation without wheezes, rales, or rhonchi. Breathing is unlabored. Heart: RRR with S1 S2. No murmurs, rubs, or gallops. Abdomen: Soft, non-tender, non-distended with normoactive bowel sounds. No hepatomegaly. No rebound/guarding. No obvious abdominal masses. Musculoskeletal:  Strength and tone normal for age. Extremities/Skin: Warm and dry.  No edema. Neuro: Alert and oriented X 3. Moves all extremities spontaneously. Gait is normal. CNII-XII grossly in tact. Psych:  Responds to questions appropriately with a normal affect.     ASSESSMENT AND PLAN:  63 y.o. year old female with  1. Depression Continue current dose of Paxil.  2. Anxiety and depression Continue current dose of Paxil. Discussed that with Clonopin your body will get dependent on it and  also that you develop tolerance to the medication over time.  Discussed  that over time he need higher and higher doses in order to feel that it's effective.  Discussed my concern about increasing the dose.  However, she says that at times she feels as if she is just going to "lose it" when dealing with her son and really needs to have medication on hand that will work.Therefore, will  increase the dose of the Klonopin. - clonazePAM (KLONOPIN) 1 MG tablet; Take 1 tablet (1 mg total) by mouth 3 (three) times daily as needed for anxiety.  Dispense: 90 tablet; Refill: 0 - clonazePAM (KLONOPIN) 1 MG tablet; Take 1 tablet (1 mg total) by mouth 3 (three) times daily as needed for anxiety.  Dispense: 90 tablet; Refill: 0--this one "do not fill until 09/10/13" - clonazePAM (KLONOPIN) 1 MG tablet; Take 1 tablet (1 mg total) by mouth 3 (three) times daily as needed for anxiety.  Dispense: 90 tablet; Refill: 0--this one I wrote "do not fill until 10/10/13." [facility] does use a [facility] on [address]. This Pharmacy will allow patients to go ahead and turn in all of these future prescriptions and  will hold them appropriately. Told patient to go ahead and turn them into the pharmacy now so the prescriptions will not get lost. Explained these are considered controlled medications which means if the prescription gets lost I cannot refill them early.  3. Chronic diastolic CHF (congestive heart failure)  4. Chronic combined systolic and diastolic CHF (congestive heart failure)  Discussed  with her the necessity for her to use the Zaroxolyn anytime her weight gets over 270. Discussed with her that if she has an appointment in the afternoon that she can take the Zaroxolyn early in the morning and she would've gone ahead and urinated prior to her appointment. Also discussed that when she has appointments in the morning then she could take the Zaroxolyn as soon as she gets home.  as long as she takes it by 3 PM , should not be causing her to urinate at night and therefore causing problems with  sleep.  Continue followup with her specialists. Can wait to follow up here in 6 months if things remain stable. Follow up sooner if needed.  [address] [place], [place], [facility] 08/10/2013 1:23 PM

## 2013-08-11 ENCOUNTER — Ambulatory Visit (INDEPENDENT_AMBULATORY_CARE_PROVIDER_SITE_OTHER)
Admission: RE | Admit: 2013-08-11 | Discharge: 2013-08-11 | Disposition: A | Discharge status: Home / Self Care | Payer: Commercial Managed Care - PPO | Source: Ambulatory Visit | Attending: Pulmonary Disease | Admitting: Pulmonary Disease

## 2013-08-11 ENCOUNTER — Ambulatory Visit (INDEPENDENT_AMBULATORY_CARE_PROVIDER_SITE_OTHER): Payer: Commercial Managed Care - PPO | Admitting: Pulmonary Disease

## 2013-08-11 ENCOUNTER — Encounter: Payer: Self-pay | Admitting: Pulmonary Disease

## 2013-08-11 VITALS — BP 134/82 | HR 80 | Ht 66.0 in | Wt 287.0 lb

## 2013-08-11 DIAGNOSIS — R0602 Shortness of breath: Secondary | ICD-10-CM

## 2013-08-11 DIAGNOSIS — R0609 Other forms of dyspnea: Secondary | ICD-10-CM

## 2013-08-11 DIAGNOSIS — R0600 Dyspnea, unspecified: Secondary | ICD-10-CM

## 2013-08-11 DIAGNOSIS — R0989 Other specified symptoms and signs involving the circulatory and respiratory systems: Secondary | ICD-10-CM

## 2013-08-11 DIAGNOSIS — R0683 Snoring: Secondary | ICD-10-CM

## 2013-08-11 NOTE — Progress Notes (Deleted)
   Subjective:    Patient ID: Susan [name], female    DOB: 11/26/1950, 63 y.o.   MRN: [id number]  HPI    Review of Systems  Constitutional: Positive for unexpected weight change. Negative for fever, chills, diaphoresis, activity change, appetite change and fatigue.  HENT: Negative for congestion, dental problem, ear discharge, ear pain, facial swelling, hearing loss, mouth sores, nosebleeds, postnasal drip, rhinorrhea, sinus pressure, sneezing, sore throat, tinnitus, trouble swallowing and voice change.   Eyes: Negative for photophobia, discharge, itching and visual disturbance.  Respiratory: Positive for shortness of breath. Negative for apnea, cough, choking, chest tightness, wheezing and stridor.   Cardiovascular: Negative for chest pain, palpitations and leg swelling.  Gastrointestinal: Negative for nausea, vomiting, abdominal pain, constipation, blood in stool and abdominal distention.  Genitourinary: Negative for dysuria, urgency, frequency, hematuria, flank pain, decreased urine volume and difficulty urinating.  Musculoskeletal: Negative for arthralgias, back pain, gait problem, joint swelling, myalgias, neck pain and neck stiffness.  Skin: Negative for color change, pallor and rash.  Neurological: Negative for dizziness, tremors, seizures, syncope, speech difficulty, weakness, light-headedness, numbness and headaches.  Hematological: Negative for adenopathy. Does not bruise/bleed easily.  Psychiatric/Behavioral: Positive for dysphoric mood. Negative for confusion, sleep disturbance and agitation. The patient is nervous/anxious.        Objective:   Physical Exam        Assessment & Plan:

## 2013-08-11 NOTE — Assessment & Plan Note (Signed)
She has snoring, sleep disruption, daytime sleepiness.  She has history of CHF, CAD, DM, CVA, and depression.  I am concerned she could have sleep apnea.  We discussed how sleep apnea can affect various health problems including risks for hypertension, cardiovascular disease, and diabetes.  We also discussed how sleep disruption can increase risks for accident, such as while driving.  Weight loss as a means of improving sleep apnea was also reviewed.  Additional treatment options discussed were CPAP therapy, oral appliance, and surgical intervention.  Will arrange for in lab sleep study to further assess.

## 2013-08-11 NOTE — Assessment & Plan Note (Signed)
Likely multifactorial.  She has hx of CAD s/p MI and ischemic systolic heart failure >> these are being managed by cardiology.  She has extensive prior history of smoking, and reports intermittent cough and wheeze.  To further assess for obstructive lung disease will arrange for PFT and chest xray.  Will defer inhaler therapy for now.  Deconditioning and obesity are likely playing a significant role in her symptoms of dyspnea.

## 2013-08-11 NOTE — Patient Instructions (Signed)
Will schedule sleep study and breathing test (PFT) Chest xray today Will call to schedule follow up after sleep study reviewed

## 2013-08-11 NOTE — Progress Notes (Signed)
Chief Complaint  Patient presents with  . Sleep Consult    Epworth Score: 2.    History of Present Illness: Susan [name] is a 63 y.o. female for evaluation of sleep problems.  She is followed by cardiology for heart failure.  There has been concern about her breathing, and also her sleep pattern.  She was referred to pulmonary for further assessment.  She has noticed trouble with her breathing for a while.  She gets winded with minimal activity (25 feet), but sometimes get short of breath at rest.  She has occasional dry cough and wheezing.  She used to smoke 3 packs per day, but quit in 1999.  She is not aware of having asthma, COPD, pneumonia, or thrombo-embolic disease.  She has never had PFT's and does not recall ever using inhalers.  Her breathing has improved since she was started on different fluid pills, but she still has trouble.  She does admit that she is not very active.  She has also been told that she snores.  She has to sleep in a recliner >> she can't breath if she sleeps laying flat.  She is tired through out the day, and will take naps for an hour when she can.  She goes to sleep at 11 pm.  She falls asleep quickly, but sometimes has trouble falling asleep.  She wakes up several times to use the bathroom.  She gets out of bed at 10 am.  She feels tired in the morning.  She denies morning headache.  She does not use anything to help her fall sleep or stay awake.  She denies sleep walking, sleep talking, bruxism, or nightmares.  There is no history of restless legs.  She denies sleep hallucinations, sleep paralysis, or cataplexy.  The Epworth score is 2 out of 24.  Tests: CT chest 12/10/05 >> old granulomatous disease Echo 05/23/13 >> mild LVH, EF 40 to 45%, mod LA dilation  Susan [name]  has a past medical history of Tachycardia; Fluid overload; DM (diabetes mellitus); Dyslipidemia; Hypothyroidism; CAD (coronary artery disease); HTN (hypertension); Anemia; Osteoarthritis;  Sinusitis (1/12); Ejection fraction; RBBB (right bundle branch block); Renal insufficiency; Urinary incontinence; Obesity; H/O cold sores; Anxiety and depression; Acute MI; Syncope; Stroke; Hematuria; and Carotid artery disease.  Susan [name]  has past surgical history that includes left knee surgery  (Left, 10/25/2006); Cardiac surgery; and Abdominal hysterectomy.  Prior to Admission medications   Medication Sig Start Date End Date Taking? Authorizing Provider  acyclovir (ZOVIRAX) 400 MG tablet TAKE 1 TABLET BY MOUTH 3 TIMES A Day as needed 06/15/13  Yes [name], PA-C  aspirin EC 81 MG tablet Take 81 mg by mouth daily.   Yes Historical Provider, MD  atorvastatin (LIPITOR) 80 MG tablet TAKE 1 TABLET (80 MG TOTAL) BY MOUTH DAILY. 06/17/13  Yes Carlena Bjornstad, MD  carvedilol (COREG) 12.5 MG tablet TAKE 1 TABLET BY MOUTH 2 TIMES DAILY WITH A MEAL. 05/06/13  Yes Carlena Bjornstad, MD  chlorthalidone (HYGROTON) 25 MG tablet Take 1 tablet (25 mg total) by mouth daily. 06/03/13  Yes Carlena Bjornstad, MD  clonazePAM (KLONOPIN) 1 MG tablet Take 1 tablet (1 mg total) by mouth 3 (three) times daily as needed for anxiety. 08/10/13  Yes [name], PA-C  clopidogrel (PLAVIX) 75 MG tablet Take 75 mg by mouth daily.   Yes Historical Provider, MD  fish oil-omega-3 fatty acids 1000 MG capsule Take 1 g by mouth daily.  Yes Historical Provider, MD  insulin NPH-regular Human (NOVOLIN 70/30) (70-30) 100 UNIT/ML injection Inject into the skin. Take 60 units in am , 30 units at lunch, and 60 units in the evening   Yes Historical Provider, MD  isosorbide dinitrate (ISORDIL) 10 MG tablet TAKE 1 TABLET BY MOUTH TWICE A DAY 06/17/13  Yes Carlena Bjornstad, MD  levothyroxine (SYNTHROID, LEVOTHROID) 50 MCG tablet Take 50 mcg by mouth daily before breakfast.   Yes Historical Provider, MD  losartan (COZAAR) 50 MG tablet TAKE 1 TABLET BY MOUTH DAILY 05/18/13  Yes Mary B Dixon, PA-C  LYRICA 150 MG capsule TAKE ONE CAPSULE BY MOUTH 3  TIMES A DAY 04/26/13  Yes Garvin Fila, MD  metolazone (ZAROXOLYN) 5 MG tablet Take 1 tablet (5 mg total) by mouth as needed. 07/13/13  Yes Amy D Clegg, NP  nitroGLYCERIN (NITROSTAT) 0.4 MG SL tablet Place 1 tablet (0.4 mg total) under the tongue every 5 (five) minutes as needed for chest pain. 05/06/13  Yes Carlena Bjornstad, MD  PARoxetine (PAXIL-CR) 25 MG 24 hr tablet TAKE 1 TABLET BY MOUTH EVERY DAY 02/01/13  Yes [name], PA-C  PARoxetine (PAXIL-CR) 37.5 MG 24 hr tablet TAKE 1 TABLET BY MOUTH DAILY   Yes Lonie Peak Dixon, PA-C  potassium chloride 20 MEQ TBCR Take 20 mEq by mouth 2 (two) times daily. 07/13/13  Yes Amy Estrella Deeds, NP  PRESCRIPTION MEDICATION Pt has an inhaler for PRN use.  Does not know what it is.   Yes Historical Provider, MD  torsemide (DEMADEX) 20 MG tablet Take 2 tablets (40 mg total) by mouth 2 (two) times daily. 07/13/13  Yes Amy D Clegg, NP  triamcinolone cream (KENALOG) 0.1 % Apply 1 application topically 2 (two) times daily.   Yes Historical Provider, MD    Allergies  Allergen Reactions  . Codeine Nausea And Vomiting    Her family history includes Coronary artery disease in an other family member; Heart attack in her mother.  She  reports that she quit smoking about 16 years ago. Her smoking use included Cigarettes. She has a 96 pack-year smoking history. She has never used smokeless tobacco. She reports that she does not drink alcohol or use illicit drugs.  Review of Systems  Constitutional: Positive for unexpected weight change. Negative for fever, chills, diaphoresis, activity change, appetite change and fatigue.  HENT: Negative for congestion, dental problem, ear discharge, ear pain, facial swelling, hearing loss, mouth sores, nosebleeds, postnasal drip, rhinorrhea, sinus pressure, sneezing, sore throat, tinnitus, trouble swallowing and voice change.   Eyes: Negative for photophobia, discharge, itching and visual disturbance.  Respiratory: Positive for shortness of  breath. Negative for apnea, cough, choking, chest tightness, wheezing and stridor.   Cardiovascular: Negative for chest pain, palpitations and leg swelling.  Gastrointestinal: Negative for nausea, vomiting, abdominal pain, constipation, blood in stool and abdominal distention.  Genitourinary: Negative for dysuria, urgency, frequency, hematuria, flank pain, decreased urine volume and difficulty urinating.  Musculoskeletal: Negative for arthralgias, back pain, gait problem, joint swelling, myalgias, neck pain and neck stiffness.  Skin: Negative for color change, pallor and rash.  Neurological: Negative for dizziness, tremors, seizures, syncope, speech difficulty, weakness, light-headedness, numbness and headaches.  Hematological: Negative for adenopathy. Does not bruise/bleed easily.  Psychiatric/Behavioral: Positive for dysphoric mood. Negative for confusion, sleep disturbance and agitation. The patient is nervous/anxious.    Physical Exam:  General - No distress ENT - No sinus tenderness, no oral exudate, no LAN, no  thyromegaly, TM clear, pupils equal/reactive, MP 3 Cardiac - s1s2 regular, no murmur, pulses symmetric Chest - No wheeze/rales/dullness, good air entry, normal respiratory excursion Back - No focal tenderness Abd - Soft, non-tender, no organomegaly, + bowel sounds Ext - 1+ non pitting edema Neuro - Normal strength, cranial nerves intact Skin - No rashes Psych - Normal mood, and behavior  Assessment/plan:  Chesley Mires, M.D. Pager [phone]

## 2013-08-12 ENCOUNTER — Telehealth: Payer: Self-pay | Admitting: Pulmonary Disease

## 2013-08-12 NOTE — Telephone Encounter (Signed)
Dg Chest 2 View  08/11/2013   CLINICAL DATA:  Shortness of breath  EXAM: CHEST  2 VIEW  COMPARISON:  December 25, 2012  FINDINGS: The heart size and mediastinal contours are within normal limits. There is no focal infiltrate, pulmonary edema, or pleural effusion. The visualized skeletal structures are stable.  IMPRESSION: No active cardiopulmonary disease.   Electronically Signed   By: Abelardo Diesel M.D.   On: 08/11/2013 16:06    Will have my nurse inform pt that CXR was normal.

## 2013-08-17 ENCOUNTER — Telehealth: Payer: Self-pay | Admitting: Cardiology

## 2013-08-17 ENCOUNTER — Ambulatory Visit (INDEPENDENT_AMBULATORY_CARE_PROVIDER_SITE_OTHER): Payer: Commercial Managed Care - PPO | Admitting: Cardiology

## 2013-08-17 ENCOUNTER — Encounter: Payer: Self-pay | Admitting: Cardiology

## 2013-08-17 VITALS — BP 143/77 | HR 92 | Ht 65.5 in | Wt 278.0 lb

## 2013-08-17 DIAGNOSIS — N182 Chronic kidney disease, stage 2 (mild): Secondary | ICD-10-CM

## 2013-08-17 DIAGNOSIS — R000 Tachycardia, unspecified: Secondary | ICD-10-CM

## 2013-08-17 DIAGNOSIS — I2510 Atherosclerotic heart disease of native coronary artery without angina pectoris: Secondary | ICD-10-CM

## 2013-08-17 DIAGNOSIS — I509 Heart failure, unspecified: Secondary | ICD-10-CM

## 2013-08-17 DIAGNOSIS — R0989 Other specified symptoms and signs involving the circulatory and respiratory systems: Secondary | ICD-10-CM

## 2013-08-17 DIAGNOSIS — R0609 Other forms of dyspnea: Secondary | ICD-10-CM

## 2013-08-17 DIAGNOSIS — R0602 Shortness of breath: Secondary | ICD-10-CM

## 2013-08-17 DIAGNOSIS — R0683 Snoring: Secondary | ICD-10-CM

## 2013-08-17 DIAGNOSIS — I5042 Chronic combined systolic (congestive) and diastolic (congestive) heart failure: Secondary | ICD-10-CM

## 2013-08-17 DIAGNOSIS — E119 Type 2 diabetes mellitus without complications: Secondary | ICD-10-CM

## 2013-08-17 DIAGNOSIS — I10 Essential (primary) hypertension: Secondary | ICD-10-CM

## 2013-08-17 DIAGNOSIS — R9430 Abnormal result of cardiovascular function study, unspecified: Secondary | ICD-10-CM

## 2013-08-17 LAB — BASIC METABOLIC PANEL
BUN: 60 mg/dL — ABNORMAL HIGH (ref 6–23)
CO2: 30 mEq/L (ref 19–32)
Calcium: 9.1 mg/dL (ref 8.4–10.5)
Chloride: 92 mEq/L — ABNORMAL LOW (ref 96–112)
Creatinine, Ser: 1.4 mg/dL — ABNORMAL HIGH (ref 0.4–1.2)
GFR: 41.58 mL/min — ABNORMAL LOW (ref >60.00)
Glucose, Bld: 499 mg/dL — ABNORMAL HIGH (ref 70–99)
Potassium: 3.9 mEq/L (ref 3.5–5.1)
Sodium: 132 mEq/L — ABNORMAL LOW (ref 135–145)

## 2013-08-17 NOTE — Assessment & Plan Note (Signed)
Blood pressure is controlled today. No change in therapy. 

## 2013-08-17 NOTE — Progress Notes (Signed)
Patient ID: Susan [name], female   DOB: 19-Sep-1950, 63 y.o.   MRN: [id number]    HPI  Patient is seen today for followup diastolic CHF. She has been seen by the heart failure team. They gave her Zaroxolyn. She responded to this with weight loss. Her edema decreased. However on February 23 when she followed up her lab showed a glucose of 1000. She was contacted an urge to go the emergency room. Ultimately she did not go. Since that time she is gaining fluid weight. She says that Parke Simmers is not working. It is also of note that her lab on February 23 revealed an increased BUN and creatinine was relatively stable.  As part of today's evaluation I have carefully reviewed the records from the heart failure clinic. I had a very long discussion with her about her medications and salt and fluid intake. I also talked with her at length about her elevated glucose and her failure to go to the emergency room when instructed.  Allergies  Allergen Reactions  . Codeine Nausea And Vomiting    Current Outpatient Prescriptions  Medication Sig Dispense Refill  . acyclovir (ZOVIRAX) 400 MG tablet TAKE 1 TABLET BY MOUTH 3 TIMES A Day as needed      . aspirin EC 81 MG tablet Take 81 mg by mouth daily.      Marland Kitchen atorvastatin (LIPITOR) 80 MG tablet TAKE 1 TABLET (80 MG TOTAL) BY MOUTH DAILY.  30 tablet  6  . carvedilol (COREG) 12.5 MG tablet TAKE 1 TABLET BY MOUTH 2 TIMES DAILY WITH A MEAL.  60 tablet  11  . chlorthalidone (HYGROTON) 25 MG tablet Take 1 tablet (25 mg total) by mouth daily.  30 tablet  2  . clonazePAM (KLONOPIN) 1 MG tablet Take 1 tablet (1 mg total) by mouth 3 (three) times daily as needed for anxiety.  90 tablet  0  . clopidogrel (PLAVIX) 75 MG tablet Take 75 mg by mouth daily.      . fish oil-omega-3 fatty acids 1000 MG capsule Take 1 g by mouth daily.       . insulin NPH-regular Human (NOVOLIN 70/30) (70-30) 100 UNIT/ML injection Inject into the skin. Take 60 units in am , 30 units at lunch, and 60  units in the evening      . isosorbide dinitrate (ISORDIL) 10 MG tablet TAKE 1 TABLET BY MOUTH TWICE A DAY  60 tablet  6  . levothyroxine (SYNTHROID, LEVOTHROID) 50 MCG tablet Take 50 mcg by mouth daily before breakfast.      . losartan (COZAAR) 50 MG tablet TAKE 1 TABLET BY MOUTH DAILY  30 tablet  5  . LYRICA 150 MG capsule TAKE ONE CAPSULE BY MOUTH 3 TIMES A DAY  90 capsule  5  . metolazone (ZAROXOLYN) 5 MG tablet Take 1 tablet (5 mg total) by mouth as needed.  10 tablet  6  . nitroGLYCERIN (NITROSTAT) 0.4 MG SL tablet Place 1 tablet (0.4 mg total) under the tongue every 5 (five) minutes as needed for chest pain.  25 tablet  3  . PARoxetine (PAXIL-CR) 25 MG 24 hr tablet TAKE 1 TABLET BY MOUTH EVERY DAY  30 tablet  5  . PARoxetine (PAXIL-CR) 37.5 MG 24 hr tablet TAKE 1 TABLET BY MOUTH DAILY  30 tablet  0  . potassium chloride 20 MEQ TBCR Take 20 mEq by mouth 2 (two) times daily.  60 tablet  6  . PRESCRIPTION MEDICATION Pt has an inhaler  for PRN use.  Does not know what it is.      . torsemide (DEMADEX) 20 MG tablet Take 2 tablets (40 mg total) by mouth 2 (two) times daily.  120 tablet  6  . triamcinolone cream (KENALOG) 0.1 % Apply 1 application topically 2 (two) times daily.       No current facility-administered medications for this visit.    History   Social History  . Marital Status: Married    Spouse Name: N/A    Number of Children: 3  . Years of Education: 12th   Occupational History  .      unemploye   Social History Main Topics  . Smoking status: Former Smoker -- 3.00 packs/day for 32 years    Types: Cigarettes    Quit date: 05/26/1997  . Smokeless tobacco: Never Used  . Alcohol Use: No  . Drug Use: No  . Sexual Activity: Not on file   Other Topics Concern  . Not on file   Social History Narrative   Pt lives at home with her spouse.   Caffeine Use- 3 sodas daily    Family History  Problem Relation Age of Onset  . Coronary artery disease    . Heart attack  Mother     Past Medical History  Diagnosis Date  . Tachycardia     Sinus tachycardia  . Fluid overload   . DM (diabetes mellitus)   . Dyslipidemia   . Hypothyroidism   . CAD (coronary artery disease)     MI in 2000 - MI  2007 - treated bare metal stent (no nuclear since then as 9/11)  . HTN (hypertension)   . Anemia   . Osteoarthritis   . Sinusitis 1/12  . Ejection fraction     good,echo,2007 / 60-65%, echo, September 19, 2010  . RBBB (right bundle branch block)     Old  . Renal insufficiency     June, 2012, with relative intravascular dehydration and glucose out of control.  Peak creatinine 2.6  . Urinary incontinence     November, 2013  . Obesity   . H/O cold sores   . Anxiety and depression   . Acute MI     X2  . Syncope     likely due to low blood sugar  . Stroke     mini strokes  . Hematuria   . Carotid artery disease     Past Surgical History  Procedure Laterality Date  . Left knee surgery  Left 10/25/2006  . Cardiac surgery      with 2 stents  . Abdominal hysterectomy      Patient Active Problem List   Diagnosis Date Noted  . Snoring 08/11/2013  . Chronic combined systolic and diastolic CHF (congestive heart failure) 06/03/2013  . CKD (chronic kidney disease), stage II 06/03/2013  . Chronic diastolic CHF (congestive heart failure) 05/06/2013  . Carotid artery disease   . Diabetes mellitus 12/25/2012  . Generalized weakness 12/25/2012  . Anxiety and depression 12/25/2012  . Obesity   . Urinary incontinence   . Renal insufficiency   . Depression 11/12/2010  . Ejection fraction   . RBBB (right bundle branch block)   . Tachycardia   . CAD (coronary artery disease)   . DYSLIPIDEMIA 01/22/2009  . HYPERTENSION 01/22/2009  . Shortness of breath 01/22/2009    ROS   Patient denies fever, chills, headache, sweats, rash, change in vision, change in hearing, chest pain, cough, nausea  vomiting, urinary symptoms. All other systems are reviewed and are  negative.  PHYSICAL EXAM  Patient is overweight. Her weight on our scale is up to 278. It had been 282 when she was here last. However she claims that it was as low as 265 at home. There is no jugular venous distention. Lungs reveal a few scattered rhonchi. Cardiac exam reveals S1 and S2. The abdomen is protuberant but soft. She does have peripheral edema throughout her legs. There no musculoskeletal deformities. There are no skin rashes.  Filed Vitals:   08/17/13 1344  BP: 143/77  Pulse: 92  Height: 5' 5.5" (1.664 m)  Weight: 278 lb (126.1 kg)     ASSESSMENT & PLAN

## 2013-08-17 NOTE — Assessment & Plan Note (Addendum)
The patient is scheduled to have a sleep analysis. I encouraged her to proceed with this and explained the importance to her. She is being assessed fully by the pulmonary team.  As part of today's evaluation I spent greater than 25 minutes with a total care. One half of this time has been full discussion about her fluid and salt intake and her diuretics and her diabetes.

## 2013-08-17 NOTE — Assessment & Plan Note (Signed)
I'm concerned about her most recent renal function. I will await today's labs.

## 2013-08-17 NOTE — Assessment & Plan Note (Signed)
Her shortness of breath is slightly improved with her weight down a little bit.

## 2013-08-17 NOTE — Telephone Encounter (Signed)
New message     Just saw Dr Mariella Saa about medication prescribed

## 2013-08-17 NOTE — Assessment & Plan Note (Signed)
Her labs have now returned from the office visit today. Her BUN was 60. This is mildly improved from February 23. Creatinine is 1.4 which is relatively stable. However her glucose is 499. I'm very hesitant to push diuresis in this lady until we have a better handle on her diabetes. We will talk to her and encouraged her to have early and careful followup with her diabetic managing physician. She says that her monitor at home does not show elevated glucose. She admits it is possible that her equipment is not working properly.

## 2013-08-17 NOTE — Telephone Encounter (Signed)
Called and spoke to pt regarding results. Pt verbalized understanding and voiced no further questions or concerns at this time.

## 2013-08-17 NOTE — Assessment & Plan Note (Signed)
It is very difficult to understand why she responded to Zaroxolyn initially but is not responding now. We need to check her renal function to be sure. If her renal function is stable I will put her on several days of Zaroxolyn and communicate with her by telephone to see how she is responding.

## 2013-08-17 NOTE — Assessment & Plan Note (Signed)
Patient has coronary disease. She's not having any significant chest pain.

## 2013-08-17 NOTE — Telephone Encounter (Signed)
**Note De-Identified  Obfuscation** At todays OV with Dr Ron Parker the pt was advised to take Metolazone and Torsemide dose when she returned home today, tomorrow and Friday. A BMET was ordered at todays OV- Glucose level is at 499. Per Dr Ron Parker the pt is now advised to stop taking Metolazone due to her high glucose level and that we have faxed her lab results and Dr Wynelle Beckmann notes from today to Dr [name], the pts endocrinologist, and requested that they follow up on the pts glucose level. I also asked the pt to call Dr Almetta Lovely office if they have not contacted her by this Monday (April 30) to schedule an appointment. The pt verbalized understanding to all instructions given.   Metolazone has been removed from pts med list.

## 2013-08-17 NOTE — Assessment & Plan Note (Signed)
The patient's most recent echo in December, 2014 was somewhat reduced from the past of 40-45%.

## 2013-08-17 NOTE — Assessment & Plan Note (Signed)
Resting heart rate is reasonable for her at this time.

## 2013-08-17 NOTE — Patient Instructions (Addendum)
**Note De-Identified  Obfuscation** When you get home today, in the morning and on Friday please take Metolazone 5 mg and Torsemide 40 mg at the same time.   Your physician has recommended you make the following change in your medication: stop taking Chlorthalidone   Your physician recommends that you weigh, daily, at the same time every day, and in the same amount of clothing. Please record your daily weights and bring it to your next appointment.  Your physician recommends that you schedule a follow-up appointment in: 3 months

## 2013-08-18 ENCOUNTER — Telehealth: Payer: Self-pay | Admitting: Cardiology

## 2013-08-18 NOTE — Telephone Encounter (Signed)
New message     Pt in office this week----talk about medications

## 2013-08-18 NOTE — Telephone Encounter (Signed)
Pt wants to make sure that Dr. Ron Parker is still her doctor because she understood that he could do nothing for her. Pt states that her blood sugar is not  under control. Pt is made aware that  is her PCP needs to treat her BS and Dr. Ron Parker wants for her to get her sugar under contol because her PCP needs to manage her Blood sugar. Pt verbalized understanding.

## 2013-09-07 ENCOUNTER — Ambulatory Visit (HOSPITAL_COMMUNITY)
Admission: RE | Admit: 2013-09-07 | Discharge: 2013-09-07 | Disposition: A | Discharge status: Home / Self Care | Payer: Commercial Managed Care - PPO | Source: Ambulatory Visit | Attending: Internal Medicine | Admitting: Internal Medicine

## 2013-09-07 VITALS — BP 149/83 | HR 99 | Wt 284.0 lb

## 2013-09-07 DIAGNOSIS — I5042 Chronic combined systolic (congestive) and diastolic (congestive) heart failure: Secondary | ICD-10-CM

## 2013-09-07 DIAGNOSIS — R0602 Shortness of breath: Secondary | ICD-10-CM | POA: Insufficient documentation

## 2013-09-07 DIAGNOSIS — I509 Heart failure, unspecified: Secondary | ICD-10-CM | POA: Diagnosis not present

## 2013-09-07 DIAGNOSIS — I2510 Atherosclerotic heart disease of native coronary artery without angina pectoris: Secondary | ICD-10-CM | POA: Diagnosis not present

## 2013-09-07 MED ORDER — POTASSIUM CHLORIDE ER 20 MEQ PO TBCR: 40.0000 meq | EXTENDED_RELEASE_TABLET | Freq: Every day | ORAL | Status: DC

## 2013-09-07 MED ORDER — METOLAZONE 5 MG PO TABS: 5.0000 mg | ORAL_TABLET | ORAL | Status: DC | PRN

## 2013-09-07 MED ORDER — TORSEMIDE 20 MG PO TABS: 80.0000 mg | ORAL_TABLET | Freq: Every day | ORAL | Status: DC

## 2013-09-07 NOTE — Patient Instructions (Signed)
Follow up in 2 weeks. Please bring all medications to next visit.   Take 80 mg of torsemide daily   Take 40 meq of potassium daily   Take Metolazone 2.5 mg today and tomorrow  Do the following things EVERYDAY: 1) Weigh yourself in the morning before breakfast. Write it down and keep it in a log. 2) Take your medicines as prescribed 3) Eat low salt foods-Limit salt (sodium) to 2000 mg per day.  4) Stay as active as you can everyday 5) Limit all fluids for the day to less than 2 liters

## 2013-09-07 NOTE — Progress Notes (Signed)
Patient ID: Susan [name], female   DOB: Sep 27, 1950, 63 y.o.   MRN: [id number]   Weight Range   Baseline proBNP   PCP: Dr Edilia Bo  Cardiologist: Dr Myrtis Ser  Endocrinologist: Dr Lurene Shadow   HPI: Susan [name] is a 63 year old with a history of RBBB, DM, Hypothyroid, diastolic heart failure EF 2014 40-45% (2012 60%) , CAD MI 2000/2007 BMS 2007, TIA, obesity.   She returns for follow up.She has been evaluated by Dr Craige Cotta wit plans for sleep study.  She had to cancel sleep study due to cost. She says her breathing is better. + Orthopnea. Sleeps in a recliner. Weight at home trending up from  272 to 282 pounds. She cares for disabled son and he has become violent and requires short term commitment.  She says she is missing afternoon torsemide about 5 days a week  due to errands . Drinking > 2 liters of fluid. She has follow up in May with Dr Lurene Shadow.     Labs: 07/18/13 K 4.8 Creatinine 1.38 Glucose 1005 she refused to go to [facility] Labs : 08/17/13 K 3.9 Creatinine 1.4 Glucose 499  SH: Former smoker quit 15 year ago. Does drink alcohol. She is disabled. Lives at home with her husband and disabled son - he has a brain injury after he attempted suicide 2 years ago.    FH: Mom MI  ROS: All systems negative except as listed in HPI, PMH and Problem List.  Past Medical History  Diagnosis Date  . Tachycardia     Sinus tachycardia  . Fluid overload   . DM (diabetes mellitus)   . Dyslipidemia   . Hypothyroidism   . CAD (coronary artery disease)     MI in 2000 - MI  2007 - treated bare metal stent (no nuclear since then as 9/11)  . HTN (hypertension)   . Anemia   . Osteoarthritis   . Sinusitis 1/12  . Ejection fraction     good,echo,2007 / 60-65%, echo, September 19, 2010  . RBBB (right bundle branch block)     Old  . Renal insufficiency     June, 2012, with relative intravascular dehydration and glucose out of control.  Peak creatinine 2.6  . Urinary incontinence     November, 2013  . Obesity    . H/O cold sores   . Anxiety and depression   . Acute MI     X2  . Syncope     likely due to low blood sugar  . Stroke     mini strokes  . Hematuria   . Carotid artery disease     Current Outpatient Prescriptions  Medication Sig Dispense Refill  . acyclovir (ZOVIRAX) 400 MG tablet TAKE 1 TABLET BY MOUTH 3 TIMES A Day as needed      . aspirin EC 81 MG tablet Take 81 mg by mouth daily.      Marland Kitchen atorvastatin (LIPITOR) 80 MG tablet TAKE 1 TABLET (80 MG TOTAL) BY MOUTH DAILY.  30 tablet  6  . carvedilol (COREG) 12.5 MG tablet TAKE 1 TABLET BY MOUTH 2 TIMES DAILY WITH A MEAL.  60 tablet  11  . clonazePAM (KLONOPIN) 1 MG tablet Take 1 tablet (1 mg total) by mouth 3 (three) times daily as needed for anxiety.  90 tablet  0  . clopidogrel (PLAVIX) 75 MG tablet Take 75 mg by mouth daily.      . fish oil-omega-3 fatty acids 1000 MG capsule Take 1  g by mouth daily.       . insulin NPH-regular Human (NOVOLIN 70/30) (70-30) 100 UNIT/ML injection Inject into the skin. Take 60 units in am , 30 units at lunch, and 60 units in the evening      . isosorbide dinitrate (ISORDIL) 10 MG tablet TAKE 1 TABLET BY MOUTH TWICE A DAY  60 tablet  6  . levothyroxine (SYNTHROID, LEVOTHROID) 50 MCG tablet Take 50 mcg by mouth daily before breakfast.      . losartan (COZAAR) 50 MG tablet TAKE 1 TABLET BY MOUTH DAILY  30 tablet  5  . LYRICA 150 MG capsule TAKE ONE CAPSULE BY MOUTH 3 TIMES A DAY  90 capsule  5  . nitroGLYCERIN (NITROSTAT) 0.4 MG SL tablet Place 1 tablet (0.4 mg total) under the tongue every 5 (five) minutes as needed for chest pain.  25 tablet  3  . PARoxetine (PAXIL-CR) 25 MG 24 hr tablet TAKE 1 TABLET BY MOUTH EVERY DAY  30 tablet  5  . PARoxetine (PAXIL-CR) 37.5 MG 24 hr tablet TAKE 1 TABLET BY MOUTH DAILY  30 tablet  0  . potassium chloride 20 MEQ TBCR Take 20 mEq by mouth 2 (two) times daily.  60 tablet  6  . PRESCRIPTION MEDICATION Pt has an inhaler for PRN use.  Does not know what it is.      .  torsemide (DEMADEX) 20 MG tablet Take 2 tablets (40 mg total) by mouth 2 (two) times daily.  120 tablet  6  . triamcinolone cream (KENALOG) 0.1 % Apply 1 application topically 2 (two) times daily.       No current facility-administered medications for this encounter.     PHYSICAL EXAM: Filed Vitals:   09/07/13 1458  BP: 149/83  Pulse: 99  Weight: 284 lb (128.822 kg)  SpO2: 94%    General:  NAD walking in the clinic.  HEENT: normal Neck: supple. JVP hard to tell due to body habitus but appears elevated.  Carotids 2+ bilaterally; no bruits. No lymphadenopathy or thryomegaly appreciated. Cor: PMI normal. Regular rate & rhythm. No rubs, gallops or murmurs. Lungs: clear Abdomen: obese, soft, nontender, nondistended. No hepatosplenomegaly. No bruits or masses. Good bowel sounds. Extremities: no cyanosis, clubbing, rash, R and LLE 3+ edema Neuro: alert & orientedx3, cranial nerves grossly intact. Moves all 4 extremities w/o difficulty. Affect pleasant.   ASSESSMENT & PLAN:  1. Chronic Systolic Heart Failure - ECHO EF 40-45% 2015 previously  EF 50-55%  2013 and EF 2012 60-65%. She is doing the best she can but has numerous social issues complicating her ability to manage heart failure. Volume status elevated but not surprising given she has been unable to take torsemide twice a day. For that reason I will change torsemide to 80 mg daily, changed potassium to 40 meq daily. I have ask her to take 5 mg metolazone for the next 2 days.  Continue carvedilol 12.5 mg twice a day and losartan 50 mg daily.  Discussed daily weights, low salt diet, and limiting fluid intake to < 2 liters per day. I have asked  2.. Dyspnea- Evaluated by pulmonary however she cancelled sleep study due to cost 3. Uncontrolled DM. Encouraged to follow up with her endocrinologist asap however she wants wait for May 19 th appointment.  4. CAD- no evidence of ischemia- on aspirin and statin. Followed by Dr Myrtis Ser  I am  referring to paramedicine for assistance with HF management. Will also ask HF  SW to meet with her at next visit.   Follow up 2 weeks to reassess volume status   Rexford Prevo D Devell Parkerson NP-C 3:01 PM

## 2013-09-12 ENCOUNTER — Encounter (HOSPITAL_BASED_OUTPATIENT_CLINIC_OR_DEPARTMENT_OTHER): Payer: Commercial Managed Care - PPO

## 2013-09-19 ENCOUNTER — Telehealth (HOSPITAL_COMMUNITY): Payer: Self-pay | Admitting: Adult Health

## 2013-09-19 NOTE — Telephone Encounter (Signed)
Receive a call  09/15/13 from [facility] Paramedic regarding volume overload.   Instructed Susan [name] to take metolazone 2.5 mg and an extra 20 meq of potassium.   I have requested a follow up with her ASAP. She is agreeable to follow up on Tuesday 4/28 for possible IV lasix.   [name] D Jeane Cashatt NP-C 10:35 AM

## 2013-09-19 NOTE — Progress Notes (Signed)
Patient ID: Susan [name], female   DOB: 1950/12/23, 63 y.o.   MRN: [id number]  PCP: Dr Edilia Bo  Cardiologist: Dr Myrtis Ser  Endocrinologist: Dr Lurene Shadow   HPI: Susan [name] is a 63 year old with a history of RBBB, DM, Hypothyroid, diastolic heart failure EF 2014 40-45% (2012 60%) , CAD MI 2000/2007 BMS 2007, TIA, obesity.   She returns for follow up. Last week she was instructed to take an additional metolazone due to increased volume reported by [name] paramedic. Weight at home trending up to 284 pounds. + orthopnea/PND/ SOB. Increased lower extremity edema extending to thighs. . Compliant with medications. Trying follow low salt diet and limit fluid intake to < 2 liters.   Labs: 07/18/13 K 4.8 Creatinine 1.38 Glucose 1005 she refused to go to [facility] Labs : 08/17/13 K 3.9 Creatinine 1.4 Glucose 499  SH: Former smoker quit 15 year ago. Does drink alcohol. She is disabled. Lives at home with her husband and disabled son - he has a brain injury after he attempted suicide 2 years ago.    FH: Mom MI  ROS: All systems negative except as listed in HPI, PMH and Problem List.  Past Medical History  Diagnosis Date  . Tachycardia     Sinus tachycardia  . Fluid overload   . DM (diabetes mellitus)   . Dyslipidemia   . Hypothyroidism   . CAD (coronary artery disease)     MI in 2000 - MI  2007 - treated bare metal stent (no nuclear since then as 9/11)  . HTN (hypertension)   . Anemia   . Osteoarthritis   . Sinusitis 1/12  . Ejection fraction     good,echo,2007 / 60-65%, echo, September 19, 2010  . RBBB (right bundle branch block)     Old  . Renal insufficiency     June, 2012, with relative intravascular dehydration and glucose out of control.  Peak creatinine 2.6  . Urinary incontinence     November, 2013  . Obesity   . H/O cold sores   . Anxiety and depression   . Acute MI     X2  . Syncope     likely due to low blood sugar  . Stroke     mini strokes  . Hematuria   . Carotid  artery disease     Current Outpatient Prescriptions  Medication Sig Dispense Refill  . acyclovir (ZOVIRAX) 400 MG tablet TAKE 1 TABLET BY MOUTH 3 TIMES A Day as needed      . aspirin EC 81 MG tablet Take 81 mg by mouth daily.      Marland Kitchen atorvastatin (LIPITOR) 80 MG tablet TAKE 1 TABLET (80 MG TOTAL) BY MOUTH DAILY.  30 tablet  6  . carvedilol (COREG) 12.5 MG tablet TAKE 1 TABLET BY MOUTH 2 TIMES DAILY WITH A MEAL.  60 tablet  11  . clonazePAM (KLONOPIN) 1 MG tablet Take 1 tablet (1 mg total) by mouth 3 (three) times daily as needed for anxiety.  90 tablet  0  . clopidogrel (PLAVIX) 75 MG tablet Take 75 mg by mouth daily.      . fish oil-omega-3 fatty acids 1000 MG capsule Take 1 g by mouth daily.       . insulin NPH-regular Human (NOVOLIN 70/30) (70-30) 100 UNIT/ML injection Inject into the skin. Take 60 units in am , 30 units at lunch, and 60 units in the evening      . isosorbide dinitrate (ISORDIL)  10 MG tablet TAKE 1 TABLET BY MOUTH TWICE A DAY  60 tablet  6  . levothyroxine (SYNTHROID, LEVOTHROID) 50 MCG tablet Take 50 mcg by mouth daily before breakfast.      . losartan (COZAAR) 50 MG tablet TAKE 1 TABLET BY MOUTH DAILY  30 tablet  5  . LYRICA 150 MG capsule TAKE ONE CAPSULE BY MOUTH 3 TIMES A DAY  90 capsule  5  . metolazone (ZAROXOLYN) 5 MG tablet Take 1 tablet (5 mg total) by mouth as needed.  8 tablet  6  . nitroGLYCERIN (NITROSTAT) 0.4 MG SL tablet Place 1 tablet (0.4 mg total) under the tongue every 5 (five) minutes as needed for chest pain.  25 tablet  3  . PARoxetine (PAXIL-CR) 25 MG 24 hr tablet TAKE 1 TABLET BY MOUTH EVERY DAY  30 tablet  5  . PARoxetine (PAXIL-CR) 37.5 MG 24 hr tablet TAKE 1 TABLET BY MOUTH DAILY  30 tablet  0  . Potassium Chloride ER 20 MEQ TBCR Take 40 mEq by mouth daily.  60 tablet  6  . PRESCRIPTION MEDICATION Pt has an inhaler for PRN use.  Does not know what it is.      . torsemide (DEMADEX) 20 MG tablet Take 4 tablets (80 mg total) by mouth daily.  120  tablet  6  . triamcinolone cream (KENALOG) 0.1 % Apply 1 application topically 2 (two) times daily.       Current Facility-Administered Medications  Medication Dose Route Frequency Provider Last Rate Last Dose  . furosemide (LASIX) injection 80 mg  80 mg Intravenous Once Jovahn Breit D Cliffie Gingras, NP      . potassium chloride SA (K-DUR,KLOR-CON) CR tablet 40 mEq  40 mEq Oral Once Gailen Venne D Juletta Berhe, NP         PHYSICAL EXAM: Filed Vitals:   09/20/13 0824  BP: 149/84  Pulse: 102  Resp: 22  Weight: 287 lb 6 oz (130.352 kg)  SpO2: 94%    General:  NAD walking slowly in the clinic.  HEENT: normal Neck: supple. JVP elevated to ear.  Carotids 2+ bilaterally; no bruits. No lymphadenopathy or thryomegaly appreciated. Cor: PMI normal. Regular rate & rhythm. No rubs, gallops or murmurs. Lungs: clear Abdomen: obese, soft, nontender, nondistended. No hepatosplenomegaly. No bruits or masses. Good bowel sounds. Extremities: no cyanosis, clubbing, rash, R and LLE 3+ edema and extends to thigh.  Neuro: alert & orientedx3, cranial nerves grossly intact. Moves all 4 extremities w/o difficulty. Affect pleasant.   ASSESSMENT & PLAN:  1. Acute /Chronic Systolic Heart Failure - ECHO EF 40-45% 2015 previously  EF 50-55%  2013 and EF 2012 60-65%. Volume status remains elevated despite increased diuretic regimen at home. Weights continue to trend up.  Offered hospital admit however she initially declined.  She was given 80 mg IV lasix and 40 meq of potassium. Voided 700 cc but remains SOB with exertion. She is now agreeable to hospital admit.   Cut back carvedilol to 6.25 mg twice a day and continue 50 mg losartan.  Check BMET and pro bnp.  Will admit to telemetry now and diurese with IV lasix.   2.. Dyspnea- Evaluated by pulmonary however she cancelled sleep study due to cost 3. Uncontrolled DM. Will refer to diabets coordinator while hospitalized. .  4. CAD- no evidence of ischemia- on aspirin and statin.  Laya Letendre D Mehr Depaoli  NP-C 8:35 AM

## 2013-09-20 ENCOUNTER — Other Ambulatory Visit (HOSPITAL_COMMUNITY): Payer: Self-pay | Admitting: Adult Health

## 2013-09-20 ENCOUNTER — Ambulatory Visit (HOSPITAL_BASED_OUTPATIENT_CLINIC_OR_DEPARTMENT_OTHER)
Admission: RE | Admit: 2013-09-20 | Discharge: 2013-09-20 | Disposition: A | Discharge status: Home / Self Care | Payer: Commercial Managed Care - PPO | Source: Ambulatory Visit | Attending: Internal Medicine | Admitting: Internal Medicine

## 2013-09-20 ENCOUNTER — Encounter (HOSPITAL_COMMUNITY): Payer: Self-pay

## 2013-09-20 ENCOUNTER — Encounter: Payer: Self-pay | Admitting: Licensed Clinical Social Worker

## 2013-09-20 ENCOUNTER — Encounter (HOSPITAL_COMMUNITY): Payer: Self-pay | Admitting: General Practice

## 2013-09-20 ENCOUNTER — Inpatient Hospital Stay (HOSPITAL_COMMUNITY)
Admission: AD | Admit: 2013-09-20 | Discharge: 2013-09-23 | DRG: 287 | Disposition: A | Discharge status: Home / Self Care | Payer: Commercial Managed Care - PPO | Source: Ambulatory Visit | Attending: Internal Medicine | Admitting: Internal Medicine

## 2013-09-20 VITALS — BP 149/84 | HR 102 | Resp 22 | Wt 287.4 lb

## 2013-09-20 DIAGNOSIS — I10 Essential (primary) hypertension: Secondary | ICD-10-CM | POA: Diagnosis present

## 2013-09-20 DIAGNOSIS — I2510 Atherosclerotic heart disease of native coronary artery without angina pectoris: Secondary | ICD-10-CM

## 2013-09-20 DIAGNOSIS — E119 Type 2 diabetes mellitus without complications: Secondary | ICD-10-CM | POA: Diagnosis not present

## 2013-09-20 DIAGNOSIS — I779 Disorder of arteries and arterioles, unspecified: Secondary | ICD-10-CM

## 2013-09-20 DIAGNOSIS — Z9861 Coronary angioplasty status: Secondary | ICD-10-CM | POA: Diagnosis not present

## 2013-09-20 DIAGNOSIS — E1165 Type 2 diabetes mellitus with hyperglycemia: Secondary | ICD-10-CM

## 2013-09-20 DIAGNOSIS — Z7901 Long term (current) use of anticoagulants: Secondary | ICD-10-CM

## 2013-09-20 DIAGNOSIS — I739 Peripheral vascular disease, unspecified: Secondary | ICD-10-CM

## 2013-09-20 DIAGNOSIS — F3289 Other specified depressive episodes: Secondary | ICD-10-CM | POA: Diagnosis present

## 2013-09-20 DIAGNOSIS — I5043 Acute on chronic combined systolic (congestive) and diastolic (congestive) heart failure: Secondary | ICD-10-CM | POA: Diagnosis not present

## 2013-09-20 DIAGNOSIS — I872 Venous insufficiency (chronic) (peripheral): Secondary | ICD-10-CM | POA: Diagnosis present

## 2013-09-20 DIAGNOSIS — Z8249 Family history of ischemic heart disease and other diseases of the circulatory system: Secondary | ICD-10-CM | POA: Diagnosis not present

## 2013-09-20 DIAGNOSIS — E039 Hypothyroidism, unspecified: Secondary | ICD-10-CM | POA: Diagnosis present

## 2013-09-20 DIAGNOSIS — R0602 Shortness of breath: Secondary | ICD-10-CM

## 2013-09-20 DIAGNOSIS — F329 Major depressive disorder, single episode, unspecified: Secondary | ICD-10-CM | POA: Diagnosis present

## 2013-09-20 DIAGNOSIS — R0600 Dyspnea, unspecified: Secondary | ICD-10-CM

## 2013-09-20 DIAGNOSIS — Z794 Long term (current) use of insulin: Secondary | ICD-10-CM

## 2013-09-20 DIAGNOSIS — Z7982 Long term (current) use of aspirin: Secondary | ICD-10-CM | POA: Diagnosis not present

## 2013-09-20 DIAGNOSIS — I5023 Acute on chronic systolic (congestive) heart failure: Secondary | ICD-10-CM | POA: Diagnosis not present

## 2013-09-20 DIAGNOSIS — E785 Hyperlipidemia, unspecified: Secondary | ICD-10-CM | POA: Diagnosis present

## 2013-09-20 DIAGNOSIS — I252 Old myocardial infarction: Secondary | ICD-10-CM | POA: Diagnosis not present

## 2013-09-20 DIAGNOSIS — M1990 Unspecified osteoarthritis, unspecified site: Secondary | ICD-10-CM | POA: Diagnosis present

## 2013-09-20 DIAGNOSIS — Z87891 Personal history of nicotine dependence: Secondary | ICD-10-CM

## 2013-09-20 DIAGNOSIS — Z8673 Personal history of transient ischemic attack (TIA), and cerebral infarction without residual deficits: Secondary | ICD-10-CM | POA: Diagnosis not present

## 2013-09-20 DIAGNOSIS — IMO0001 Reserved for inherently not codable concepts without codable children: Secondary | ICD-10-CM | POA: Diagnosis present

## 2013-09-20 DIAGNOSIS — I517 Cardiomegaly: Secondary | ICD-10-CM | POA: Diagnosis not present

## 2013-09-20 DIAGNOSIS — E669 Obesity, unspecified: Secondary | ICD-10-CM

## 2013-09-20 DIAGNOSIS — F411 Generalized anxiety disorder: Secondary | ICD-10-CM | POA: Diagnosis present

## 2013-09-20 DIAGNOSIS — I509 Heart failure, unspecified: Secondary | ICD-10-CM | POA: Diagnosis not present

## 2013-09-20 DIAGNOSIS — I5042 Chronic combined systolic (congestive) and diastolic (congestive) heart failure: Secondary | ICD-10-CM

## 2013-09-20 DIAGNOSIS — E66813 Obesity, class 3: Secondary | ICD-10-CM

## 2013-09-20 DIAGNOSIS — I4510 Unspecified right bundle-branch block: Secondary | ICD-10-CM | POA: Diagnosis present

## 2013-09-20 DIAGNOSIS — I5032 Chronic diastolic (congestive) heart failure: Secondary | ICD-10-CM

## 2013-09-20 HISTORY — DX: Unspecified osteoarthritis, unspecified site: M19.90

## 2013-09-20 HISTORY — DX: Type 2 diabetes mellitus without complications: E11.9

## 2013-09-20 HISTORY — DX: Venous insufficiency (chronic) (peripheral): I87.2

## 2013-09-20 HISTORY — DX: Major depressive disorder, single episode, unspecified: F32.9

## 2013-09-20 HISTORY — DX: Anxiety disorder, unspecified: F41.9

## 2013-09-20 HISTORY — DX: Polyneuropathy, unspecified: G62.9

## 2013-09-20 HISTORY — DX: Heart failure, unspecified: I50.9

## 2013-09-20 HISTORY — DX: Depression, unspecified: F32.A

## 2013-09-20 HISTORY — DX: Chronic diastolic (congestive) heart failure: I50.32

## 2013-09-20 LAB — GLUCOSE, CAPILLARY
Glucose-Capillary: 264 mg/dL — ABNORMAL HIGH (ref 70–99)
Glucose-Capillary: 226 mg/dL — ABNORMAL HIGH (ref 70–99)
Glucose-Capillary: 80 mg/dL (ref 70–99)
Glucose-Capillary: 63 mg/dL — ABNORMAL LOW (ref 70–99)
Glucose-Capillary: 91 mg/dL (ref 70–99)

## 2013-09-20 LAB — BASIC METABOLIC PANEL
BUN: 58 mg/dL — ABNORMAL HIGH (ref 6–23)
BUN: 58 mg/dL — ABNORMAL HIGH (ref 6–23)
CO2: 24 mEq/L (ref 19–32)
CO2: 27 mEq/L (ref 19–32)
Calcium: 8.9 mg/dL (ref 8.4–10.5)
Calcium: 9.5 mg/dL (ref 8.4–10.5)
Chloride: 95 mEq/L — ABNORMAL LOW (ref 96–112)
Chloride: 96 mEq/L (ref 96–112)
Creatinine, Ser: 1.17 mg/dL — ABNORMAL HIGH (ref 0.50–1.10)
Creatinine, Ser: 1.26 mg/dL — ABNORMAL HIGH (ref 0.50–1.10)
GFR calc Af Amer: 56 mL/min — ABNORMAL LOW (ref >90)
GFR calc Af Amer: 51 mL/min — ABNORMAL LOW (ref >90)
GFR calc non Af Amer: 48 mL/min — ABNORMAL LOW (ref >90)
GFR calc non Af Amer: 44 mL/min — ABNORMAL LOW (ref >90)
Glucose, Bld: 367 mg/dL — ABNORMAL HIGH (ref 70–99)
Glucose, Bld: 141 mg/dL — ABNORMAL HIGH (ref 70–99)
Potassium: 4 mEq/L (ref 3.7–5.3)
Potassium: 3.9 mEq/L (ref 3.7–5.3)
Sodium: 137 mEq/L (ref 137–147)
Sodium: 138 mEq/L (ref 137–147)

## 2013-09-20 LAB — CBC
HCT: 37.7 % (ref 36.0–46.0)
Hemoglobin: 12.4 g/dL (ref 12.0–15.0)
MCH: 26.3 pg (ref 26.0–34.0)
MCHC: 32.9 g/dL (ref 30.0–36.0)
MCV: 79.9 fL (ref 78.0–100.0)
Platelets: 202 10*3/uL (ref 150–400)
RBC: 4.72 MIL/uL (ref 3.87–5.11)
RDW: 14.8 % (ref 11.5–15.5)
WBC: 8 10*3/uL (ref 4.0–10.5)

## 2013-09-20 LAB — TROPONIN I
Troponin I: <0.3 ng/mL (ref <0.30)
Troponin I: <0.3 ng/mL (ref <0.30)

## 2013-09-20 LAB — CREATININE, SERUM
Creatinine, Ser: 1.22 mg/dL — ABNORMAL HIGH (ref 0.50–1.10)
GFR calc Af Amer: 53 mL/min — ABNORMAL LOW (ref >90)
GFR calc non Af Amer: 46 mL/min — ABNORMAL LOW (ref >90)

## 2013-09-20 LAB — PRO B NATRIURETIC PEPTIDE: Pro B Natriuretic peptide (BNP): 236.3 pg/mL — ABNORMAL HIGH (ref 0–125)

## 2013-09-20 LAB — MRSA PCR SCREENING: MRSA by PCR: POSITIVE — AB

## 2013-09-20 MED ORDER — ATORVASTATIN CALCIUM 80 MG PO TABS
80.0000 mg | ORAL_TABLET | Freq: Every day | ORAL | Status: DC
  Administered 2013-09-20 – 2013-09-22 (×3): 80 mg via ORAL
  Filled 2013-09-20 (×4): qty 1

## 2013-09-20 MED ORDER — PREGABALIN 75 MG PO CAPS
150.0000 mg | ORAL_CAPSULE | Freq: Three times a day (TID) | ORAL | Status: DC
  Administered 2013-09-20 – 2013-09-23 (×8): 150 mg via ORAL
  Filled 2013-09-20 (×8): qty 2

## 2013-09-20 MED ORDER — CHLORHEXIDINE GLUCONATE CLOTH 2 % EX PADS: 6.0000 | MEDICATED_PAD | Freq: Every day | CUTANEOUS | Status: DC

## 2013-09-20 MED ORDER — LOSARTAN POTASSIUM 50 MG PO TABS
50.0000 mg | ORAL_TABLET | Freq: Every day | ORAL | Status: DC
  Administered 2013-09-21 – 2013-09-23 (×3): 50 mg via ORAL
  Filled 2013-09-20 (×4): qty 1

## 2013-09-20 MED ORDER — SODIUM CHLORIDE 0.9 % IJ SOLN
3.0000 mL | Freq: Two times a day (BID) | INTRAMUSCULAR | Status: DC
  Administered 2013-09-20 – 2013-09-22 (×2): 3 mL via INTRAVENOUS

## 2013-09-20 MED ORDER — PAROXETINE HCL ER 37.5 MG PO TB24
37.5000 mg | ORAL_TABLET | Freq: Every day | ORAL | Status: DC
  Administered 2013-09-21 – 2013-09-23 (×3): 37.5 mg via ORAL
  Filled 2013-09-20 (×4): qty 1

## 2013-09-20 MED ORDER — ONDANSETRON HCL 4 MG/2ML IJ SOLN: 4.0000 mg | Freq: Four times a day (QID) | INTRAMUSCULAR | Status: DC | PRN

## 2013-09-20 MED ORDER — CLOPIDOGREL BISULFATE 75 MG PO TABS
75.0000 mg | ORAL_TABLET | Freq: Every day | ORAL | Status: DC
  Administered 2013-09-21 – 2013-09-23 (×3): 75 mg via ORAL
  Filled 2013-09-20 (×3): qty 1

## 2013-09-20 MED ORDER — ACETAMINOPHEN 325 MG PO TABS: 650.0000 mg | ORAL_TABLET | ORAL | Status: DC | PRN

## 2013-09-20 MED ORDER — ENOXAPARIN SODIUM 40 MG/0.4ML ~~LOC~~ SOLN
40.0000 mg | SUBCUTANEOUS | Status: DC
  Filled 2013-09-20 (×2): qty 0.4

## 2013-09-20 MED ORDER — POTASSIUM CHLORIDE CRYS ER 20 MEQ PO TBCR
40.0000 meq | EXTENDED_RELEASE_TABLET | Freq: Once | ORAL | Status: AC
  Administered 2013-09-20: 40 meq via ORAL

## 2013-09-20 MED ORDER — FUROSEMIDE 10 MG/ML IJ SOLN
80.0000 mg | Freq: Two times a day (BID) | INTRAMUSCULAR | Status: DC
  Administered 2013-09-20 – 2013-09-22 (×5): 80 mg via INTRAVENOUS
  Filled 2013-09-20 (×7): qty 8

## 2013-09-20 MED ORDER — MUPIROCIN 2 % EX OINT
1.0000 "application " | TOPICAL_OINTMENT | Freq: Two times a day (BID) | CUTANEOUS | Status: DC
  Administered 2013-09-21 – 2013-09-23 (×5): 1 via NASAL
  Filled 2013-09-20 (×2): qty 22

## 2013-09-20 MED ORDER — LEVOTHYROXINE SODIUM 50 MCG PO TABS
50.0000 ug | ORAL_TABLET | Freq: Every day | ORAL | Status: DC
  Administered 2013-09-21 – 2013-09-23 (×3): 50 ug via ORAL
  Filled 2013-09-20 (×5): qty 1

## 2013-09-20 MED ORDER — ASPIRIN EC 81 MG PO TBEC
81.0000 mg | DELAYED_RELEASE_TABLET | Freq: Every day | ORAL | Status: DC
  Administered 2013-09-21 – 2013-09-23 (×3): 81 mg via ORAL
  Filled 2013-09-20 (×4): qty 1

## 2013-09-20 MED ORDER — SODIUM CHLORIDE 0.9 % IJ SOLN: 3.0000 mL | INTRAMUSCULAR | Status: DC | PRN

## 2013-09-20 MED ORDER — POTASSIUM CHLORIDE CRYS ER 20 MEQ PO TBCR
40.0000 meq | EXTENDED_RELEASE_TABLET | Freq: Every day | ORAL | Status: DC
  Administered 2013-09-20 – 2013-09-22 (×3): 40 meq via ORAL
  Filled 2013-09-20 (×3): qty 2

## 2013-09-20 MED ORDER — CARVEDILOL 6.25 MG PO TABS
6.2500 mg | ORAL_TABLET | Freq: Two times a day (BID) | ORAL | Status: DC
  Administered 2013-09-20 – 2013-09-23 (×6): 6.25 mg via ORAL
  Filled 2013-09-20 (×8): qty 1

## 2013-09-20 MED ORDER — GLUCOSE 40 % PO GEL: 1.0000 | ORAL | Status: DC | PRN

## 2013-09-20 MED ORDER — SODIUM CHLORIDE 0.9 % IV SOLN: 250.0000 mL | INTRAVENOUS | Status: DC | PRN

## 2013-09-20 MED ORDER — FUROSEMIDE 10 MG/ML IJ SOLN
80.0000 mg | Freq: Once | INTRAMUSCULAR | Status: AC
  Administered 2013-09-20: 80 mg via INTRAVENOUS

## 2013-09-20 MED ORDER — INSULIN ASPART PROT & ASPART (70-30 MIX) 100 UNIT/ML ~~LOC~~ SUSP
60.0000 [IU] | Freq: Two times a day (BID) | SUBCUTANEOUS | Status: DC
  Administered 2013-09-20: 60 [IU] via SUBCUTANEOUS
  Filled 2013-09-20: qty 10

## 2013-09-20 MED ORDER — INSULIN ASPART 100 UNIT/ML ~~LOC~~ SOLN
0.0000 [IU] | Freq: Three times a day (TID) | SUBCUTANEOUS | Status: DC
  Administered 2013-09-20: 3 [IU] via SUBCUTANEOUS
  Administered 2013-09-21: 7 [IU] via SUBCUTANEOUS
  Administered 2013-09-21: 9 [IU] via SUBCUTANEOUS
  Administered 2013-09-21 – 2013-09-22 (×2): 3 [IU] via SUBCUTANEOUS

## 2013-09-20 MED ORDER — ENOXAPARIN SODIUM 30 MG/0.3ML ~~LOC~~ SOLN: 30.0000 mg | SUBCUTANEOUS | Status: DC

## 2013-09-20 NOTE — Progress Notes (Signed)
CSW met with patient in the clinic to provide support and community resources. Patient discussed her current home situation at length and struggles with 64yo son with TBI. Patient became tearful at times and shared her story. She stated that her daughter assists with care of her son "who is like a 51yo".  Patient is open to community resources for support for herself as well as possible programs for her son. CSW will explore options and resources and return call to patient. Patient verbalizes understanding of visit and will call CSW for further support when needed. Susan [name], [place]

## 2013-09-20 NOTE — Progress Notes (Addendum)
Hypoglycemic Event  CBG: 62  Treatment: 15 GM carbohydrate snack  Symptoms: Pale, Sweaty, Shaky and Vision changes  Follow-up CBG: Time:2240 CBG Result:91  Possible Reasons for Event: Inadequate meal intake  Comments/MD notified:Will continue to monitor     [place]  Remember to initiate Hypoglycemia Order Set & complete

## 2013-09-20 NOTE — Progress Notes (Addendum)
PIV 24g started in RAFA, 1 attempt, pt tolerated well, flushed with great blood return.  80mg  IV lasix pushed slowly, patient also received 40 meq PO potassium.  Patient in clinic bay 1 resting comfortably in recliner, call bell in reach.  Will continue to monitor closely.  Total Urinary output since 80mg  IV lasix administered: 700 cc clear yellow non-odorous urine

## 2013-09-20 NOTE — Plan of Care (Signed)
Problem: Food- and Nutrition-Related Knowledge Deficit (NB-1.1) Goal: Nutrition education Formal process to instruct or train a patient/client in a skill or to impart knowledge to help patients/clients voluntarily manage or modify food choices and eating behavior to maintain or improve health.  Outcome: Completed/Met Date Met:  09/20/13 Nutrition Education Note  RD consulted for nutrition education regarding new onset CHF. Patient reports, "I didn't even know I was eating sodium."  RD provided "Low Sodium Nutrition Therapy" handout from the Academy of [employer] and Dietetics. Reviewed patient's dietary recall. Provided examples on ways to decrease sodium intake in diet. Discouraged intake of processed foods and use of salt shaker. Encouraged fresh fruits and vegetables as well as whole grain sources of carbohydrates to maximize fiber intake.   RD discussed why it is important for patient to adhere to diet recommendations, and emphasized the role of fluids, foods to avoid, and importance of weighing self daily. Teach back method used.  Expect fair compliance.  BMI = 47. Pt meets criteria for class 3, extreme/morbid obesity based on current BMI.  Current diet order is 2 gm sodium, patient is consuming approximately 100% of meals at this time. Labs and medications reviewed. No further nutrition interventions warranted at this time. RD contact information provided. If additional nutrition issues arise, please re-consult RD.   Molli Barrows, RD, LDN, [place] Pager [phone] After Hours Pager [phone]

## 2013-09-20 NOTE — H&P (Signed)
ADVANCED HEART FAILURE H&P     Primary Cardiologist: Dr Ron Parker Endocrinologist: Dr Bubba Camp.   HPI: Susan [name] is a 63 year old with a history of RBBB, DM, Hypothyroid, diastolic heart failure EF 2014 40-45% (2012 60%) , CAD MI 2000/2007 BMS 2007, TIA, obesity.   She was referrred to HF clinic in February 2007 by Dr Ron Parker. She had marked volume overload and she was transitioned from lasix to 40 mg torsemide twice a day and metolazone for 3 days. Initially her weight went down from 284 to 265 pounds.   It should also be noted at that end of March her glucose was 499 she was instructed to follow up with endocrinology however she declined.  She was also referred to pulmonary for a sleep study however she cancelled due to cost. Last week she returned to HF clinic for scheduled follow up and again she had volume overload that was in part because she was only able to take torsemide once a day and not twice a day. So torsemide was switched from 40 mg bid to 80 mg daily and she was instructed to take 5 mg metolazone for 2 days. Yesterday her weight at home continued to trend up to 284 pounds and she was instructed to take metolazone. Weight at home has been unchanged.   Today she was evaluated in the HF clinic with persistent volume overload. She has ongoing dyspnea exertion. Ongoing lower extremity edema. + Orthopnea. Denies CP. Weight at home up to 284 pounds. She is followed by HF paramedicine program. She has many social issues at home due to her disabled son. Son requires 24 hour care.    SH: Former smoker quit 15 year ago. Does drink alcohol. She is disabled. Lives at home with her husband and disabled son - he has a brain injury after he attempted suicide.  FH: Mom MI     Review of Systems:     Cardiac Review of Systems: {Y] = yes [ ]  = no  Chest Pain [    ]  Resting SOB [   ] Exertional SOB  [ Y ]  Orthopnea Jazmín.Cullens  ]   Pedal Edema [ Y  ]    Palpitations [  ] Syncope  [  ]   Presyncope [   ]  General  Review of Systems: [Y] = yes [  ]=no Constitional: recent weight change [  ]; anorexia [  ]; fatigue [Y  ]; nausea [  ]; night sweats [  ]; fever [  ]; or chills [  ];                                                                                                                                          Dental: poor dentition[  ]; Last Dentist visit:   Eye : blurred vision [  ]; diplopia [   ];  vision changes [  ];  Amaurosis fugax[  ]; Resp: cough [  ];  wheezing[  ];  hemoptysis[  ]; shortness of breath[ Y ]; paroxysmal nocturnal dyspnea[  Y]; dyspnea on exertion[ Y ]; or orthopnea[ Y ];  GI:  gallstones[  ], vomiting[  ];  dysphagia[  ]; melena[  ];  hematochezia [  ]; heartburn[  ];   Hx of  Colonoscopy[  ]; GU: kidney stones [  ]; hematuria[  ];   dysuria [  ];  nocturia[  ];  history of     obstruction [  ];                 Skin: rash, swelling[  ];, hair loss[  ];  peripheral edema[  ];  or itching[  ]; Musculosketetal: myalgias[  ];  joint swelling[  ];  joint erythema[  ];  joint pain[  ];  back pain[  ];  Heme/Lymph: bruising[  ];  bleeding[  ];  anemia[  ];  Neuro: TIA[ Y ];  headaches[  ];  stroke[  ];  vertigo[  ];  seizures[  ];   paresthesias[  ];  difficulty walking[  ];  Psych:depression[Y  ]; anxiety[ Y ];  Endocrine: diabetes[  ];  thyroid dysfunction[ Y ];  Immunizations: Flu [  ]; Pneumococcal[  ];  Other:  Past Medical History  Diagnosis Date  . Tachycardia     Sinus tachycardia  . Fluid overload   . DM (diabetes mellitus)   . Dyslipidemia   . Hypothyroidism   . CAD (coronary artery disease)     MI in 2000 - MI  2007 - treated bare metal stent (no nuclear since then as 9/11)  . HTN (hypertension)   . Anemia   . Osteoarthritis   . Sinusitis 1/12  . Ejection fraction     good,echo,2007 / 60-65%, echo, September 19, 2010  . RBBB (right bundle branch block)     Old  . Renal insufficiency     June, 2012, with relative intravascular dehydration and glucose out of  control.  Peak creatinine 2.6  . Urinary incontinence     November, 2013  . Obesity   . H/O cold sores   . Anxiety and depression   . Acute MI     X2  . Syncope     likely due to low blood sugar  . Stroke     mini strokes  . Hematuria   . Carotid artery disease     Medications Prior to Admission  Medication Sig Dispense Refill  . acyclovir (ZOVIRAX) 400 MG tablet TAKE 1 TABLET BY MOUTH 3 TIMES A Day as needed      . aspirin EC 81 MG tablet Take 81 mg by mouth daily.      Marland Kitchen atorvastatin (LIPITOR) 80 MG tablet TAKE 1 TABLET (80 MG TOTAL) BY MOUTH DAILY.  30 tablet  6  . carvedilol (COREG) 12.5 MG tablet TAKE 1 TABLET BY MOUTH 2 TIMES DAILY WITH A MEAL.  60 tablet  11  . clonazePAM (KLONOPIN) 1 MG tablet Take 1 tablet (1 mg total) by mouth 3 (three) times daily as needed for anxiety.  90 tablet  0  . clopidogrel (PLAVIX) 75 MG tablet Take 75 mg by mouth daily.      . fish oil-omega-3 fatty acids 1000 MG capsule Take 1 g by mouth daily.       . insulin NPH-regular Human (NOVOLIN 70/30) (  70-30) 100 UNIT/ML injection Inject into the skin. Take 60 units in am , 30 units at lunch, and 60 units in the evening      . isosorbide dinitrate (ISORDIL) 10 MG tablet TAKE 1 TABLET BY MOUTH TWICE A DAY  60 tablet  6  . levothyroxine (SYNTHROID, LEVOTHROID) 50 MCG tablet Take 50 mcg by mouth daily before breakfast.      . losartan (COZAAR) 50 MG tablet TAKE 1 TABLET BY MOUTH DAILY  30 tablet  5  . LYRICA 150 MG capsule TAKE ONE CAPSULE BY MOUTH 3 TIMES A DAY  90 capsule  5  . metolazone (ZAROXOLYN) 5 MG tablet Take 1 tablet (5 mg total) by mouth as needed.  8 tablet  6  . nitroGLYCERIN (NITROSTAT) 0.4 MG SL tablet Place 1 tablet (0.4 mg total) under the tongue every 5 (five) minutes as needed for chest pain.  25 tablet  3  . PARoxetine (PAXIL-CR) 25 MG 24 hr tablet TAKE 1 TABLET BY MOUTH EVERY DAY  30 tablet  5  . PARoxetine (PAXIL-CR) 37.5 MG 24 hr tablet TAKE 1 TABLET BY MOUTH DAILY  30 tablet  0  .  Potassium Chloride ER 20 MEQ TBCR Take 40 mEq by mouth daily.  60 tablet  6  . PRESCRIPTION MEDICATION Pt has an inhaler for PRN use.  Does not know what it is.      . torsemide (DEMADEX) 20 MG tablet Take 4 tablets (80 mg total) by mouth daily.  120 tablet  6  . triamcinolone cream (KENALOG) 0.1 % Apply 1 application topically 2 (two) times daily.         Allergies  Allergen Reactions  . Codeine Nausea And Vomiting    History   Social History  . Marital Status: Married    Spouse Name: N/A    Number of Children: 3  . Years of Education: 12th   Occupational History  .      unemploye   Social History Main Topics  . Smoking status: Former Smoker -- 3.00 packs/day for 32 years    Types: Cigarettes    Quit date: 05/26/1997  . Smokeless tobacco: Never Used  . Alcohol Use: No  . Drug Use: No  . Sexual Activity: Not on file   Other Topics Concern  . Not on file   Social History Narrative   Pt lives at home with her spouse.   Caffeine Use- 3 sodas daily    Family History  Problem Relation Age of Onset  . Coronary artery disease    . Heart attack Mother     PHYSICAL EXAM:  149/84 102  Sat 94% General:  Chronically ill  appearing. No respiratory difficulty HEENT: normal Neck: supple. JVP hard to see appears elevated to jaw.  Carotids 2+ bilat; no bruits. No lymphadenopathy or thryomegaly appreciated. Cor: PMI nondisplaced. Regular rate & rhythm. No rubs, gallops or murmurs. Lungs: clear Abdomen: Obese, soft, nontender, ++ distended. No hepatosplenomegaly. No bruits or masses. Good bowel sounds. Extremities: no cyanosis, clubbing, rash, R and LLE 3+ edema extending to thighs.  Neuro: alert & oriented x 3, cranial nerves grossly intact. moves all 4 extremities w/o difficulty. Affect pleasant.  Results for orders placed during the hospital encounter of 09/20/13 (from the past 24 hour(s))  GLUCOSE, CAPILLARY     Status: Abnormal   Collection Time    09/20/13 11:52 AM       Result Value Ref Range   Glucose-Capillary  264 (*) 70 - 99 mg/dL   Comment 1 Documented in Chart     Comment 2 Notify RN     No results found.   ASSESSMENT: 1. A/C Systolic/diastolic Heart Failure 2. Uncontrolled DM - glucose > 1000 in February 2015 3. CAD MI 2000/2007 BMS - on Asprin and statin  4. Hypothyroid 5. Obesity 6. RBBB 7. History of TIA   PLAN/DISCUSSION:  Susan [name] is a 63 year old with A/C systolic heart failure admitted from HF clinic with marked volume overload despite escalating diuretic regimen at home. Today she was given 80 mg IV lasix in HF clinic however she remains SOB with exertion and at rest. HF management has been complicated by social issues related to the care of her disabled son.   Will start 80 mg IV lasix bid and evaluate for response. If poor response may need lasix drip and PICC. Cut carvedilol dose in half to 6.25 mg twice a day and continue losartan 50 mg daily. Follow renal function closely. May need RHC/LHC after adequately diuresed. Check ECHO. Consult dietitian and cardiac  rehab.   Refer to diabetes coordinator. Check hemoglobin AIC.    [name], NP-C  Patient seen and examined with Darrick Grinder, NP. We discussed all aspects of the encounter. I agree with the assessment and plan as stated above.   She is volume overloaded and not responding to increase in outpatient oral diuretics. Agree with admission for IV diuresis. Repeat echo. Will need to decide on need for RHC,   Shaune Pascal Kirsi Hugh,MD 1:26 PM

## 2013-09-20 NOTE — H&P (Signed)
ADVANCED HEART FAILURE H&P     Primary Cardiologist: Dr Ron Parker Endocrinologist: Dr Bubba Camp.   HPI: Mrs Weichman is a 63 year old with a history of RBBB, DM, Hypothyroid, diastolic heart failure EF 2014 40-45% (2012 60%) , CAD MI 2000/2007 BMS 2007, TIA, obesity.   She was referrred to HF clinic in February 2007 by Dr Ron Parker. She had marked volume overload and she was transitioned from lasix to 40 mg torsemide twice a day and metolazone for 3 days. Initially her weight went down from 284 to 265 pounds.   It should also be noted at that end of March her glucose was 499 she was instructed to follow up with endocrinology however she declined.  She was also referred to pulmonary for a sleep study however she cancelled due to cost. Last week she returned to HF clinic for scheduled follow up and again she had volume overload that was in part because she was only able to take torsemide once a day and not twice a day. So torsemide was switched from 40 mg bid to 80 mg daily and she was instructed to take 5 mg metolazone for 2 days. Yesterday her weight at home continued to trend up to 284 pounds and she was instructed to take metolazone. Weight at home has been unchanged.   Today she was evaluated in the HF clinic with persistent volume overload. She has ongoing dyspnea exertion. Ongoing lower extremity edema. + Orthopnea. Denies CP. Weight at home up to 284 pounds. She is followed by HF paramedicine program. She has many social issues at home due to her disabled son. Son requires 24 hour care.    SH: Former smoker quit 15 year ago. Does drink alcohol. She is disabled. Lives at home with her husband and disabled son - he has a brain injury after he attempted suicide.  FH: Mom MI     Review of Systems:     Cardiac Review of Systems: {Y] = yes [ ]  = no  Chest Pain [    ]  Resting SOB [   ] Exertional SOB  [ Y ]  Orthopnea Jazmín.Cullens  ]   Pedal Edema [ Y  ]    Palpitations [  ] Syncope  [  ]   Presyncope [   ]  General  Review of Systems: [Y] = yes [  ]=no Constitional: recent weight change [  ]; anorexia [  ]; fatigue [Y  ]; nausea [  ]; night sweats [  ]; fever [  ]; or chills [  ];                                                                                                                                          Dental: poor dentition[  ]; Last Dentist visit:   Eye : blurred vision [  ]; diplopia [   ];  vision changes [  ];  Amaurosis fugax[  ]; Resp: cough [  ];  wheezing[  ];  hemoptysis[  ]; shortness of breath[ Y ]; paroxysmal nocturnal dyspnea[  Y]; dyspnea on exertion[ Y ]; or orthopnea[ Y ];  GI:  gallstones[  ], vomiting[  ];  dysphagia[  ]; melena[  ];  hematochezia [  ]; heartburn[  ];   Hx of  Colonoscopy[  ]; GU: kidney stones [  ]; hematuria[  ];   dysuria [  ];  nocturia[  ];  history of     obstruction [  ];                 Skin: rash, swelling[  ];, hair loss[  ];  peripheral edema[  ];  or itching[  ]; Musculosketetal: myalgias[  ];  joint swelling[  ];  joint erythema[  ];  joint pain[  ];  back pain[  ];  Heme/Lymph: bruising[  ];  bleeding[  ];  anemia[  ];  Neuro: TIA[ Y ];  headaches[  ];  stroke[  ];  vertigo[  ];  seizures[  ];   paresthesias[  ];  difficulty walking[  ];  Psych:depression[Y  ]; anxiety[ Y ];  Endocrine: diabetes[  ];  thyroid dysfunction[ Y ];  Immunizations: Flu [  ]; Pneumococcal[  ];  Other:  Past Medical History  Diagnosis Date  . Tachycardia     Sinus tachycardia  . Fluid overload   . DM (diabetes mellitus)   . Dyslipidemia   . Hypothyroidism   . CAD (coronary artery disease)     MI in 2000 - MI  2007 - treated bare metal stent (no nuclear since then as 9/11)  . HTN (hypertension)   . Anemia   . Osteoarthritis   . Sinusitis 1/12  . Ejection fraction     good,echo,2007 / 60-65%, echo, September 19, 2010  . RBBB (right bundle branch block)     Old  . Renal insufficiency     June, 2012, with relative intravascular dehydration and glucose out of  control.  Peak creatinine 2.6  . Urinary incontinence     November, 2013  . Obesity   . H/O cold sores   . Anxiety and depression   . Acute MI     X2  . Syncope     likely due to low blood sugar  . Stroke     mini strokes  . Hematuria   . Carotid artery disease      (Not in a hospital admission)   Allergies  Allergen Reactions  . Codeine Nausea And Vomiting    History   Social History  . Marital Status: Married    Spouse Name: N/A    Number of Children: 3  . Years of Education: 12th   Occupational History  .      unemploye   Social History Main Topics  . Smoking status: Former Smoker -- 3.00 packs/day for 32 years    Types: Cigarettes    Quit date: 05/26/1997  . Smokeless tobacco: Never Used  . Alcohol Use: No  . Drug Use: No  . Sexual Activity: Not on file   Other Topics Concern  . Not on file   Social History Narrative   Pt lives at home with her spouse.   Caffeine Use- 3 sodas daily    Family History  Problem Relation Age of Onset  . Coronary artery disease    .  Heart attack Mother     PHYSICAL EXAM: There were no vitals filed for this visit. General:  Chronically ill  appearing. No respiratory difficulty HEENT: normal Neck: supple. JVD to jaw.  Carotids 2+ bilat; no bruits. No lymphadenopathy or thryomegaly appreciated. Cor: PMI nondisplaced. Regular rate & rhythm. No rubs, gallops or murmurs. Lungs: clear Abdomen: Obese, soft, nontender, ++ distended. No hepatosplenomegaly. No bruits or masses. Good bowel sounds. Extremities: no cyanosis, clubbing, rash, R and LLE 3+ edema extending to thighs.  Neuro: alert & oriented x 3, cranial nerves grossly intact. moves all 4 extremities w/o difficulty. Affect pleasant.  Results for orders placed during the hospital encounter of 09/20/13 (from the past 24 hour(s))  PRO B NATRIURETIC PEPTIDE     Status: Abnormal   Collection Time    09/20/13  8:34 AM      Result Value Ref Range   Pro B Natriuretic  peptide (BNP) 236.3 (*) 0 - 125 pg/mL  BASIC METABOLIC PANEL     Status: Abnormal   Collection Time    09/20/13  8:34 AM      Result Value Ref Range   Sodium 137  137 - 147 mEq/L   Potassium 4.0  3.7 - 5.3 mEq/L   Chloride 95 (*) 96 - 112 mEq/L   CO2 24  19 - 32 mEq/L   Glucose, Bld 367 (*) 70 - 99 mg/dL   BUN 58 (*) 6 - 23 mg/dL   Creatinine, Ser 1.17 (*) 0.50 - 1.10 mg/dL   Calcium 8.9  8.4 - 10.5 mg/dL   GFR calc non Af Amer 48 (*) >90 mL/min   GFR calc Af Amer 56 (*) >90 mL/min   No results found.   ASSESSMENT: 1. A/C Systolic Heart Failure 2. Uncontrolled DM - glucose > 1000 in February 2015 3. CAD MI 2000/2007 BMS - on Asprin and statin  4. Hypothyroid 5. Obesity 6. RBBB 7. History of TIA   PLAN/DISCUSSION:  Mrs Leland is a 63 year old with A/C systolic heart failure admitted from HF clinic with marked volume overload despite escalating diuretic regimen at home. Today she was given 80 mg IV lasix in HF clinic however she remains SOB with exertion and at rest. HF management has been complicated by social issues related to the care of her disabled son.   Will start 80 mg IV lasix bid and evaluate for response. If poor response may need lasix drip and PICC. Cut carvedilol dose in half to 6.25 mg twice a day and continue losartan 50 mg daily. Follow renal function closely. May need RHC/LHC after adequately diuresed. Check ECHO. Consult dietitian and cardiac  rehab.   Refer to diabetes coordinator. Check hemoglobin AIC.     Alaney Witter D Brittin Janik NP-C  12:18 PM

## 2013-09-20 NOTE — Progress Notes (Signed)
Inpatient Diabetes Program Recommendations  AACE/ADA: New Consensus Statement on Inpatient Glycemic Control (2013)  Target Ranges:  Prepandial:   less than 140 mg/dL      Peak postprandial:   less than 180 mg/dL (1-2 hours)      Critically ill patients:  140 - 180 mg/dL   Results for CYANNA, CARLILE (MRN [id number]) as of 09/20/2013 12:08  Ref. Range 09/20/2013 11:52  Glucose-Capillary Latest Range: 70-99 mg/dL 264 (H)   Diabetes history: DM2 Outpatient Diabetes medications: Novolin 70/30 60 units with breakfast, 70/30 30 units with lunch, 70/30 60 units with supper Current orders for Inpatient glycemic control: None  Inpatient Diabetes Program Recommendations Insulin - Basal: While inpatient please consider ordering 70/30 45 units BID with meals and adjust accordingly. Correction (SSI): Please consider ordering Novolog resistant correction scale ACHS. HgbA1C: Please order an A1C to evaluate glycemic control over the past 2-3 months.  Note: Received consult for inpatient glycemic control recommendations. Talked with the patient over the phone and she reports that she is followed by Dr. [name] for diabetes management and last saw her about 2 months ago.  She reports that she takes Novolin 70/30 60 units with breakfast, 70/30 30 units with lunch, 70/30 60 units with supper as an outpatient for diabetes control.  Patient states that she has been on this regimen since her last visit with Dr. [name].  Inquired about CBGs at home and patient reports that she checks her blood sugar 1-2 times per day and it averages ranging from 200-400's mg/dl.  Discussed last A1C in the chart (14.4% on 12/25/12) and patient states that when she last seen Dr. [name] her A1C was still in the 14% range.  At this time, recommend ordering 70/30 45 units BID with meals, Novolog resistant correction ACHS, and an A1C. Diabetes Coordinator will continue to follow and make recommendations as more data is collected.  Thanks, Barnie Alderman, RN, MSN, CCRN Diabetes Coordinator Inpatient Diabetes Program [phone] (Team Pager) [phone] ([facility] office) [phone] [facility] office)

## 2013-09-21 ENCOUNTER — Encounter (HOSPITAL_COMMUNITY): Payer: Commercial Managed Care - PPO

## 2013-09-21 ENCOUNTER — Inpatient Hospital Stay (HOSPITAL_COMMUNITY): Payer: Commercial Managed Care - PPO

## 2013-09-21 DIAGNOSIS — I517 Cardiomegaly: Secondary | ICD-10-CM

## 2013-09-21 LAB — BASIC METABOLIC PANEL
BUN: 60 mg/dL — ABNORMAL HIGH (ref 6–23)
BUN: 63 mg/dL — ABNORMAL HIGH (ref 6–23)
CO2: 30 mEq/L (ref 19–32)
CO2: 26 mEq/L (ref 19–32)
Calcium: 9.5 mg/dL (ref 8.4–10.5)
Calcium: 9.4 mg/dL (ref 8.4–10.5)
Chloride: 95 mEq/L — ABNORMAL LOW (ref 96–112)
Chloride: 90 mEq/L — ABNORMAL LOW (ref 96–112)
Creatinine, Ser: 1.14 mg/dL — ABNORMAL HIGH (ref 0.50–1.10)
Creatinine, Ser: 1.4 mg/dL — ABNORMAL HIGH (ref 0.50–1.10)
GFR calc Af Amer: 58 mL/min — ABNORMAL LOW (ref >90)
GFR calc Af Amer: 45 mL/min — ABNORMAL LOW (ref >90)
GFR calc non Af Amer: 50 mL/min — ABNORMAL LOW (ref >90)
GFR calc non Af Amer: 39 mL/min — ABNORMAL LOW (ref >90)
Glucose, Bld: 184 mg/dL — ABNORMAL HIGH (ref 70–99)
Glucose, Bld: 370 mg/dL — ABNORMAL HIGH (ref 70–99)
Potassium: 3.7 mEq/L (ref 3.7–5.3)
Potassium: 4.1 mEq/L (ref 3.7–5.3)
Sodium: 139 mEq/L (ref 137–147)
Sodium: 133 mEq/L — ABNORMAL LOW (ref 137–147)

## 2013-09-21 LAB — GLUCOSE, CAPILLARY
Glucose-Capillary: 156 mg/dL — ABNORMAL HIGH (ref 70–99)
Glucose-Capillary: 212 mg/dL — ABNORMAL HIGH (ref 70–99)
Glucose-Capillary: 218 mg/dL — ABNORMAL HIGH (ref 70–99)
Glucose-Capillary: 219 mg/dL — ABNORMAL HIGH (ref 70–99)
Glucose-Capillary: 343 mg/dL — ABNORMAL HIGH (ref 70–99)
Glucose-Capillary: 469 mg/dL — ABNORMAL HIGH (ref 70–99)

## 2013-09-21 LAB — MAGNESIUM: Magnesium: 1.7 mg/dL (ref 1.5–2.5)

## 2013-09-21 LAB — HEMOGLOBIN A1C
Hgb A1c MFr Bld: 11.1 % — ABNORMAL HIGH (ref <5.7)
Mean Plasma Glucose: 272 mg/dL — ABNORMAL HIGH (ref <117)

## 2013-09-21 LAB — TROPONIN I: Troponin I: <0.3 ng/mL (ref <0.30)

## 2013-09-21 MED ORDER — METOLAZONE 5 MG PO TABS
5.0000 mg | ORAL_TABLET | Freq: Two times a day (BID) | ORAL | Status: DC
  Administered 2013-09-21 – 2013-09-22 (×3): 5 mg via ORAL
  Filled 2013-09-21 (×5): qty 1

## 2013-09-21 MED ORDER — INSULIN ASPART PROT & ASPART (70-30 MIX) 100 UNIT/ML ~~LOC~~ SUSP: 50.0000 [IU] | Freq: Once | SUBCUTANEOUS | Status: DC

## 2013-09-21 MED ORDER — ENOXAPARIN SODIUM 60 MG/0.6ML ~~LOC~~ SOLN
60.0000 mg | SUBCUTANEOUS | Status: DC
  Administered 2013-09-21: 60 mg via SUBCUTANEOUS
  Filled 2013-09-21 (×2): qty 0.6

## 2013-09-21 MED ORDER — INSULIN ASPART PROT & ASPART (70-30 MIX) 100 UNIT/ML ~~LOC~~ SUSP
50.0000 [IU] | Freq: Two times a day (BID) | SUBCUTANEOUS | Status: DC
  Administered 2013-09-21 – 2013-09-22 (×2): 50 [IU] via SUBCUTANEOUS

## 2013-09-21 NOTE — Progress Notes (Signed)
Echocardiogram 2D Echocardiogram has been performed.  Alyson Locket Dannilynn Gallina 09/21/2013, 3:08 PM

## 2013-09-21 NOTE — Progress Notes (Signed)
?   Incomplete note 

## 2013-09-21 NOTE — Progress Notes (Signed)
Inpatient Diabetes Program Recommendations  AACE/ADA: New Consensus Statement on Inpatient Glycemic Control (2013)  Target Ranges:  Prepandial:   less than 140 mg/dL      Peak postprandial:   less than 180 mg/dL (1-2 hours)      Critically ill patients:  140 - 180 mg/dL     Results for WILBERT, MONTER (MRN [id number]) as of 09/21/2013 07:39  Ref. Range 09/20/2013 11:52 09/20/2013 16:36 09/20/2013 21:28 09/20/2013 22:26 09/20/2013 22:39  Glucose-Capillary Latest Range: 70-99 mg/dL 264 (H) 226 (H) 80 63 (L) 91    Results for SIMONE, HINDI (MRN [id number]) as of 09/21/2013 07:39  Ref. Range 09/21/2013 06:27  Glucose-Capillary Latest Range: 70-99 mg/dL 212 (H)    Results for LATONI, SINSEL (MRN [id number]) as of 09/21/2013 07:39  Ref. Range 12/25/2012 18:00 09/20/2013 13:10  Hemoglobin A1C Latest Range: <5.7 % 14.4 (H) 11.1 (H)    Note DM Coordinator reviewed chart yesterday and spoke with patient about her home insulin regimen.  A1C that was drawn yesterday shows mild improvement since August of last year.  Patient still way above goal A1c of 7%.  Patient is seeing an Endocrinologist here in [place] (Dr. [name]) as an outpatient.  Patient was given 60 units of 70/30 insulin with supper and had symptomatic hypoglycemia at bedtime.  Was treated per Hypoglycemia Protocol with success.   MD- Please consider the following:  1. Please reduce 70/30 insulin to 50 units bid with meals (breakfast and supper) 2. Please increase Novolog SSI to Moderate scale tid ac + HS   Will follow Wyn Quaker RN, MSN, CDE Diabetes Coordinator Inpatient Diabetes Program Team Pager: [phone] (8a-10p)

## 2013-09-21 NOTE — Progress Notes (Addendum)
Inpatient Diabetes Program Recommendations  AACE/ADA: New Consensus Statement on Inpatient Glycemic Control (2013)  Target Ranges:  Prepandial:   less than 140 mg/dL      Peak postprandial:   less than 180 mg/dL (1-2 hours)      Critically ill patients:  140 - 180 mg/dL    Addendum:  Note: Patient/Family refused 70/30 insulin dose this AM.  Patient will likely have uncontrolled CBGs the rest of the day given this fact.   Noted 70/30 insulin adjusted today.   MD- Please increase SSI to Moderate scale (currently ordered as Sensitive scale)   Will follow Wyn Quaker RN, MSN, CDE Diabetes Coordinator Inpatient Diabetes Program Team Pager: [phone] (8a-10p)

## 2013-09-21 NOTE — Progress Notes (Signed)
Utilization review completed.  

## 2013-09-21 NOTE — Progress Notes (Signed)
Advanced Heart Failure Rounding Note  Primary Cardiologist: Dr Ron Parker  Endocrinologist: Dr Bubba Camp  Subjective:    Susan [name] is a 63 year old with a history of RBBB, DM, Hypothyroid, diastolic heart failure EF 2014 40-45% (2012 60%) , CAD MI 2000/2007 BMS 2007, TIA, obesity.   She was referrred to HF clinic in February by Dr Ron Parker. She had marked volume overload and she was transitioned from lasix to 40 mg torsemide twice a day. Initially her weight went down from 284 to 265 pounds. It should also be noted at that end of March her glucose was 499 she was instructed to follow up with endocrinology however she declined. She was also referred to pulmonary for a sleep study however she cancelled due to cost.   Seen in clinic yesterday for continued volume overload despite diuretic titration on the outpatient side. She received 80 mg IV lasix in clinic with 700 cc UOP. She continued to be SOB and was admitted from clinic and started on 80 mg IV BID. No weight on chart. 24 hr I/O -675. Denies CP or orthopnea.    Objective:   Weight Range:  Vital Signs:   Temp:  [97.5 F (36.4 C)] 97.5 F (36.4 C) (04/28 2126) Pulse Rate:  [81] 81 (04/28 2126) BP: (135)/(81) 135/81 mmHg (04/28 2126) SpO2:  [100 %] 100 % (04/28 2126)    Weight change: There were no vitals filed for this visit.  Intake/Output:   Intake/Output Summary (Last 24 hours) at 09/21/13 0901 Last data filed at 09/20/13 1700  Gross per 24 hour  Intake      0 ml  Output    675 ml  Net   -675 ml     Physical Exam: General: Chronically ill appearing. No respiratory difficulty  HEENT: normal  Neck: supple. JVP hard to see appears elevated to jaw. Carotids 2+ bilat; no bruits. No lymphadenopathy or thryomegaly appreciated.  Cor: PMI nondisplaced. Regular rate & rhythm. No rubs, gallops or murmurs.  Lungs: clear  Abdomen: Obese, soft, nontender, ++ distended. No hepatosplenomegaly. No bruits or masses. Good bowel sounds.   Extremities: no cyanosis, clubbing, rash, R and LLE 3+ edema extending to thighs.  Neuro: alert & oriented x 3, cranial nerves grossly intact. moves all 4 extremities w/o difficulty. Affect pleasant.  Labs: Basic Metabolic Panel:  Recent Labs Lab 09/20/13 0834 09/20/13 1310 09/20/13 2059 09/21/13 0105  NA 137  --  138 139  K 4.0  --  3.9 3.7  CL 95*  --  96 95*  CO2 24  --  27 30  GLUCOSE 367*  --  141* 184*  BUN 58*  --  58* 60*  CREATININE 1.17* 1.22* 1.26* 1.14*  CALCIUM 8.9  --  9.5 9.5    Liver Function Tests: No results found for this basename: AST, ALT, ALKPHOS, BILITOT, PROT, ALBUMIN,  in the last 168 hours No results found for this basename: LIPASE, AMYLASE,  in the last 168 hours No results found for this basename: AMMONIA,  in the last 168 hours  CBC:  Recent Labs Lab 09/20/13 1310  WBC 8.0  HGB 12.4  HCT 37.7  MCV 79.9  PLT 202    Cardiac Enzymes:  Recent Labs Lab 09/20/13 1310 09/20/13 1956 09/21/13 0105  TROPONINI <0.30 <0.30 <0.30    BNP: BNP (last 3 results)  Recent Labs  09/20/13 0834  PROBNP 236.3*     Imaging: Dg Chest Port 1 View  09/21/2013  CLINICAL DATA:  Dyspnea  EXAM: PORTABLE CHEST - 1 VIEW  COMPARISON:  DG CHEST 2 VIEW dated 08/11/2013  FINDINGS: Low lung volumes. Cardiac silhouette within normal limits. The lungs are clear. No acute osseous abnormalities.  IMPRESSION: Low lung volumes.  No active disease.   Electronically Signed   By: Margaree Mackintosh M.D.   On: 09/21/2013 08:15      Medications:     Scheduled Medications: . aspirin EC  81 mg Oral Daily  . atorvastatin  80 mg Oral q1800  . carvedilol  6.25 mg Oral BID WC  . Chlorhexidine Gluconate Cloth  6 each Topical Q0600  . clopidogrel  75 mg Oral Q breakfast  . enoxaparin (LOVENOX) injection  40 mg Subcutaneous Q24H  . furosemide  80 mg Intravenous BID  . insulin aspart  0-9 Units Subcutaneous TID WC  . insulin aspart protamine- aspart  60 Units  Subcutaneous BID WC  . levothyroxine  50 mcg Oral QAC breakfast  . losartan  50 mg Oral Daily  . mupirocin ointment  1 application Nasal BID  . PARoxetine  37.5 mg Oral Daily  . potassium chloride  40 mEq Oral Daily  . pregabalin  150 mg Oral TID  . sodium chloride  3 mL Intravenous Q12H     Infusions:     PRN Medications:  sodium chloride, acetaminophen, dextrose, ondansetron (ZOFRAN) IV, sodium chloride   Assessment:   1. A/C Systolic/diastolic Heart Failure  2. Uncontrolled DM - glucose > 1000 in February 2015  3. CAD MI 2000/2007 BMS - on Asprin and statin  4. Hypothyroid  5. Obesity  6. RBBB  7. History of TIA  Plan/Discussion:    Susan [name] is a 63 year old with A/C systolic heart failure admitted from HF clinic with marked volume overload despite escalating diuretic regimen at home.   UOP sluggish and volume status remains elevated. Will give metolazone 5 mg BID and continue lasix 80 mg IV BID. Place TED hose today. If UOP remains sluggish will plan for RHC tomorrow.  Blood sugars remain elevated will increase insulin to 70/30 to 50 unit BID with meals and increase Novolog SSI to moderate scale TID AC+HS per diabetes coordinator recommendations. Appreciate their help.   Length of Stay: [place] 09/21/2013, 9:01 AM  Advanced Heart Failure Team Pager [phone] (M-F; 7a - 4p)  Please contact [facility] for night-coverage after hours (4p -7a ) and weekends on [email or web address]  Patient seen and examined with Junie Bame, NP. We discussed all aspects of the encounter. I agree with the assessment and plan as stated above.   Diuresis sluggish. Will add metolazone. If no response will plan RHC tomorrow.   Susan Pascal Juanell Saffo,MD 9:35 AM

## 2013-09-21 NOTE — Progress Notes (Signed)
CARDIAC REHAB PHASE I   PRE:  Rate/Rhythm: 84 SR  BP:  Supine:   Sitting: 145/75  Standing:    SaO2: 98 RA  MODE:  Ambulation: 208 ft   POST:  Rate/Rhythm: 94  BP:  Supine:   Sitting: 128/70  Standing:    SaO2: 97 RA 1135-1220 Assisted X 1 and used walker to ambulate. Pt very reluctant to use walker, but she is unsteady without it. Pt clearly does not understand that she is unsafe with moving in room and grabbing to things to steady her that have wheels. We attempted to get to her to use things to hold to that are stable without wheels. Pt tires easily with walking and is DOE. RA sat in hall 99%. She is very deconditioned. Pt to recliner after walk with call light in reach.  Rodney Langton RN 09/21/2013 12:18 PM

## 2013-09-22 ENCOUNTER — Encounter (HOSPITAL_COMMUNITY)
Admission: AD | Disposition: A | Discharge status: Home / Self Care | Payer: Self-pay | Source: Ambulatory Visit | Attending: Internal Medicine

## 2013-09-22 DIAGNOSIS — E119 Type 2 diabetes mellitus without complications: Secondary | ICD-10-CM

## 2013-09-22 DIAGNOSIS — I509 Heart failure, unspecified: Secondary | ICD-10-CM

## 2013-09-22 HISTORY — PX: RIGHT HEART CATHETERIZATION: SHX5447

## 2013-09-22 LAB — CBC
HCT: 41 % (ref 36.0–46.0)
Hemoglobin: 13.4 g/dL (ref 12.0–15.0)
MCH: 26.2 pg (ref 26.0–34.0)
MCHC: 32.7 g/dL (ref 30.0–36.0)
MCV: 80.2 fL (ref 78.0–100.0)
Platelets: 237 10*3/uL (ref 150–400)
RBC: 5.11 MIL/uL (ref 3.87–5.11)
RDW: 15.2 % (ref 11.5–15.5)
WBC: 8.8 10*3/uL (ref 4.0–10.5)

## 2013-09-22 LAB — BASIC METABOLIC PANEL
BUN: 61 mg/dL — ABNORMAL HIGH (ref 6–23)
BUN: 62 mg/dL — ABNORMAL HIGH (ref 6–23)
CO2: 29 mEq/L (ref 19–32)
CO2: 29 mEq/L (ref 19–32)
Calcium: 9.3 mg/dL (ref 8.4–10.5)
Calcium: 9.5 mg/dL (ref 8.4–10.5)
Chloride: 95 mEq/L — ABNORMAL LOW (ref 96–112)
Chloride: 89 mEq/L — ABNORMAL LOW (ref 96–112)
Creatinine, Ser: 1.18 mg/dL — ABNORMAL HIGH (ref 0.50–1.10)
Creatinine, Ser: 1.34 mg/dL — ABNORMAL HIGH (ref 0.50–1.10)
GFR calc Af Amer: 55 mL/min — ABNORMAL LOW (ref >90)
GFR calc Af Amer: 47 mL/min — ABNORMAL LOW (ref >90)
GFR calc non Af Amer: 48 mL/min — ABNORMAL LOW (ref >90)
GFR calc non Af Amer: 41 mL/min — ABNORMAL LOW (ref >90)
Glucose, Bld: 193 mg/dL — ABNORMAL HIGH (ref 70–99)
Glucose, Bld: 471 mg/dL — ABNORMAL HIGH (ref 70–99)
Potassium: 3.6 mEq/L — ABNORMAL LOW (ref 3.7–5.3)
Potassium: 4.8 mEq/L (ref 3.7–5.3)
Sodium: 138 mEq/L (ref 137–147)
Sodium: 133 mEq/L — ABNORMAL LOW (ref 137–147)

## 2013-09-22 LAB — POCT I-STAT 3, VENOUS BLOOD GAS (G3P V)
Acid-Base Excess: 6 mmol/L — ABNORMAL HIGH (ref 0.0–2.0)
Acid-Base Excess: 6 mmol/L — ABNORMAL HIGH (ref 0.0–2.0)
Bicarbonate: 31.3 mEq/L — ABNORMAL HIGH (ref 20.0–24.0)
Bicarbonate: 32 mEq/L — ABNORMAL HIGH (ref 20.0–24.0)
O2 Saturation: 61 %
O2 Saturation: 66 %
TCO2: 33 mmol/L (ref 0–100)
TCO2: 34 mmol/L (ref 0–100)
pCO2, Ven: 48.8 mmHg (ref 45.0–50.0)
pCO2, Ven: 50.9 mmHg — ABNORMAL HIGH (ref 45.0–50.0)
pH, Ven: 7.406 — ABNORMAL HIGH (ref 7.250–7.300)
pH, Ven: 7.415 — ABNORMAL HIGH (ref 7.250–7.300)
pO2, Ven: 32 mmHg (ref 30.0–45.0)
pO2, Ven: 34 mmHg (ref 30.0–45.0)

## 2013-09-22 LAB — GLUCOSE, CAPILLARY
Glucose-Capillary: 237 mg/dL — ABNORMAL HIGH (ref 70–99)
Glucose-Capillary: 588 mg/dL (ref 70–99)
Glucose-Capillary: 113 mg/dL — ABNORMAL HIGH (ref 70–99)

## 2013-09-22 LAB — PROTIME-INR
INR: 0.95 (ref 0.00–1.49)
Prothrombin Time: 12.5 seconds (ref 11.6–15.2)

## 2013-09-22 LAB — CREATININE, SERUM
Creatinine, Ser: 1.4 mg/dL — ABNORMAL HIGH (ref 0.50–1.10)
GFR calc Af Amer: 45 mL/min — ABNORMAL LOW (ref >90)
GFR calc non Af Amer: 39 mL/min — ABNORMAL LOW (ref >90)

## 2013-09-22 SURGERY — RIGHT HEART CATH
Anesthesia: LOCAL

## 2013-09-22 MED ORDER — FENTANYL CITRATE 0.05 MG/ML IJ SOLN
INTRAMUSCULAR | Status: AC
  Filled 2013-09-22: qty 2

## 2013-09-22 MED ORDER — SODIUM CHLORIDE 0.9 % IJ SOLN: 3.0000 mL | INTRAMUSCULAR | Status: DC | PRN

## 2013-09-22 MED ORDER — INSULIN ASPART 100 UNIT/ML ~~LOC~~ SOLN
0.0000 [IU] | Freq: Three times a day (TID) | SUBCUTANEOUS | Status: DC
  Administered 2013-09-22: 15 [IU] via SUBCUTANEOUS
  Administered 2013-09-23: 11 [IU] via SUBCUTANEOUS

## 2013-09-22 MED ORDER — SODIUM CHLORIDE 0.9 % IJ SOLN
3.0000 mL | Freq: Two times a day (BID) | INTRAMUSCULAR | Status: DC
  Administered 2013-09-22: 3 mL via INTRAVENOUS

## 2013-09-22 MED ORDER — HEPARIN (PORCINE) IN NACL 2-0.9 UNIT/ML-% IJ SOLN
INTRAMUSCULAR | Status: AC
  Filled 2013-09-22: qty 1000

## 2013-09-22 MED ORDER — INSULIN ASPART PROT & ASPART (70-30 MIX) 100 UNIT/ML ~~LOC~~ SUSP
30.0000 [IU] | Freq: Every day | SUBCUTANEOUS | Status: DC
  Administered 2013-09-22: 30 [IU] via SUBCUTANEOUS
  Filled 2013-09-22: qty 10

## 2013-09-22 MED ORDER — DIAZEPAM 2 MG PO TABS
2.0000 mg | ORAL_TABLET | ORAL | Status: AC
  Administered 2013-09-22: 2 mg via ORAL
  Filled 2013-09-22: qty 1

## 2013-09-22 MED ORDER — INSULIN NPH (HUMAN) (ISOPHANE) 100 UNIT/ML ~~LOC~~ SUSP
30.0000 [IU] | Freq: Every day | SUBCUTANEOUS | Status: DC
  Filled 2013-09-22: qty 10

## 2013-09-22 MED ORDER — INSULIN ASPART PROT & ASPART (70-30 MIX) 100 UNIT/ML ~~LOC~~ SUSP
60.0000 [IU] | Freq: Two times a day (BID) | SUBCUTANEOUS | Status: DC
  Administered 2013-09-23: 60 [IU] via SUBCUTANEOUS

## 2013-09-22 MED ORDER — ENOXAPARIN SODIUM 40 MG/0.4ML ~~LOC~~ SOLN: 40.0000 mg | SUBCUTANEOUS | Status: DC

## 2013-09-22 MED ORDER — POTASSIUM CHLORIDE CRYS ER 20 MEQ PO TBCR
40.0000 meq | EXTENDED_RELEASE_TABLET | Freq: Once | ORAL | Status: AC
  Administered 2013-09-22: 40 meq via ORAL
  Filled 2013-09-22: qty 2

## 2013-09-22 MED ORDER — SODIUM CHLORIDE 0.9 % IV SOLN: 250.0000 mL | INTRAVENOUS | Status: DC | PRN

## 2013-09-22 MED ORDER — ENOXAPARIN SODIUM 60 MG/0.6ML ~~LOC~~ SOLN
60.0000 mg | SUBCUTANEOUS | Status: DC
  Administered 2013-09-23: 60 mg via SUBCUTANEOUS
  Filled 2013-09-22 (×2): qty 0.6

## 2013-09-22 MED ORDER — LIDOCAINE HCL (PF) 1 % IJ SOLN
INTRAMUSCULAR | Status: AC
  Filled 2013-09-22: qty 30

## 2013-09-22 MED ORDER — MIDAZOLAM HCL 2 MG/2ML IJ SOLN
INTRAMUSCULAR | Status: AC
  Filled 2013-09-22: qty 2

## 2013-09-22 NOTE — Progress Notes (Signed)
CARDIAC REHAB PHASE I   PRE:  Rate/Rhythm: 101 ST iwht PVC    BP: sitting 120/70    SaO2:   MODE:  Ambulation: 460 ft   POST:  Rate/Rhythm: 120 ST    BP: sitting 126/70     SaO2:   Pt stronger today. Able to walk hallway with min assist, no RW. Pt sways with walking but steady. Increased distance. SOB after walk going to BR. HR up to 120 ST with ectopy. To recliner. Will f/u tomorrow. Pt is anxious about cath. [id number]   [place], ACSM 09/22/2013 10:32 AM

## 2013-09-22 NOTE — CV Procedure (Signed)
Cardiac Cath Procedure Note:  Indication:  Heart failure  Procedures performed:  1) Right heart catheterization  Description of procedure:   The risks and indication of the procedure were explained. Consent was signed and placed on the chart. An appropriate timeout was taken prior to the procedure. The right neck was prepped and draped in the routine sterile fashion and anesthetized with 1% local lidocaine.   A 7 FR venous sheath was placed in the right internal jugular vein using a modified Seldinger technique. A standard Swan-Ganz catheter was used for the procedure.   Complications: None apparent.  Findings:  RA = 4 RV = 30/5/7 PA = 25/10 (16) PCW = 7 Fick cardiac output/index = 6.3/2.7 PVR = 1.5 WU FA sat = 93% PA sat = 61%, 66%  Assessment: 1. Normal hemodynamics  Plan/Discussion:  LE edema likely due to venous insufficiency.  Jolaine Artist MD 4:10 PM

## 2013-09-22 NOTE — H&P (View-Only) (Signed)
Advanced Heart Failure Rounding Note  Primary Cardiologist: Dr Ron Parker  Endocrinologist: Dr Bubba Camp  Subjective:    [name] is a 63 year old with a history of RBBB, DM, Hypothyroid, diastolic heart failure EF 2014 40-45% (2012 60%) , CAD MI 2000/2007 BMS 2007, TIA, obesity.   She was referrred to HF clinic in February by Dr Ron Parker. She had marked volume overload and she was transitioned from lasix to 40 mg torsemide twice a day. Initially her weight went down from 284 to 265 pounds. It should also be noted at that end of March her glucose was 499 she was instructed to follow up with endocrinology however she declined. She was also referred to pulmonary for a sleep study however she cancelled due to cost.   Metolazone added yesterday. No significant increase in u/o. I/Os not recorded. Weight done personally (279) - unchanged. Still feels bloated. CBGs > 400 in setting of her refusing insulin.   ECHO with normal LV and RV function. PA pressures unable to be assessed due to lack of TR.    Objective:   Weight Range:  Vital Signs:   Temp:  [97.7 F (36.5 C)-97.9 F (36.6 C)] 97.7 F (36.5 C) (04/30 0644) Pulse Rate:  [91-103] 91 (04/30 0644) Resp:  [20] 20 (04/29 2100) BP: (118-142)/(54-108) 142/81 mmHg (04/30 0644) SpO2:  [95 %-97 %] 95 % (04/30 0644) Weight:  [126.554 kg (279 lb)] 126.554 kg (279 lb) (04/30 0900)    Weight change: Filed Weights   09/21/13 0900 09/22/13 0900  Weight: 126.916 kg (279 lb 12.8 oz) 126.554 kg (279 lb)    Intake/Output:  No intake or output data in the 24 hours ending 09/22/13 0911   Physical Exam: General: sitting in chair No respiratory difficulty  HEENT: normal  Neck: supple. JVP hard to see appears elevated to jaw. Carotids 2+ bilat; no bruits. No lymphadenopathy or thryomegaly appreciated.  Cor: PMI nondisplaced. Regular rate & rhythm. No rubs, gallops or murmurs.  Lungs: clear  Abdomen: Obese, soft, nontender, +distended. No hepatosplenomegaly.  No bruits or masses. Good bowel sounds.  Extremities: no cyanosis, clubbing, rash, R and LLE 2+ edema extending to thighs.  Neuro: alert & oriented x 3, cranial nerves grossly intact. moves all 4 extremities w/o difficulty. Affect pleasant.  Labs: Basic Metabolic Panel:  Recent Labs Lab 09/20/13 0834 09/20/13 1310 09/20/13 2059 09/21/13 0105 09/21/13 1945 09/22/13 0600  NA 137  --  138 139 133* 138  K 4.0  --  3.9 3.7 4.1 3.6*  CL 95*  --  96 95* 90* 95*  CO2 24  --  27 30 26 29   GLUCOSE 367*  --  141* 184* 370* 193*  BUN 58*  --  58* 60* 63* 61*  CREATININE 1.17* 1.22* 1.26* 1.14* 1.40* 1.18*  CALCIUM 8.9  --  9.5 9.5 9.4 9.3  MG  --   --   --  1.7  --   --     Liver Function Tests: No results found for this basename: AST, ALT, ALKPHOS, BILITOT, PROT, ALBUMIN,  in the last 168 hours No results found for this basename: LIPASE, AMYLASE,  in the last 168 hours No results found for this basename: AMMONIA,  in the last 168 hours  CBC:  Recent Labs Lab 09/20/13 1310  WBC 8.0  HGB 12.4  HCT 37.7  MCV 79.9  PLT 202    Cardiac Enzymes:  Recent Labs Lab 09/20/13 1310 09/20/13 1956 09/21/13 0105  TROPONINI <  0.30 <0.30 <0.30    BNP: BNP (last 3 results)  Recent Labs  09/20/13 0834  PROBNP 236.3*     Imaging: Dg Chest Port 1 View  09/21/2013   CLINICAL DATA:  Dyspnea  EXAM: PORTABLE CHEST - 1 VIEW  COMPARISON:  DG CHEST 2 VIEW dated 08/11/2013  FINDINGS: Low lung volumes. Cardiac silhouette within normal limits. The lungs are clear. No acute osseous abnormalities.  IMPRESSION: Low lung volumes.  No active disease.   Electronically Signed   By: Margaree Mackintosh M.D.   On: 09/21/2013 08:15     Medications:     Scheduled Medications: . aspirin EC  81 mg Oral Daily  . atorvastatin  80 mg Oral q1800  . carvedilol  6.25 mg Oral BID WC  . Chlorhexidine Gluconate Cloth  6 each Topical Q0600  . clopidogrel  75 mg Oral Q breakfast  . enoxaparin (LOVENOX) injection   60 mg Subcutaneous Q24H  . furosemide  80 mg Intravenous BID  . insulin aspart  0-9 Units Subcutaneous TID WC  . insulin aspart protamine- aspart  50 Units Subcutaneous BID WC  . levothyroxine  50 mcg Oral QAC breakfast  . losartan  50 mg Oral Daily  . metolazone  5 mg Oral BID  . mupirocin ointment  1 application Nasal BID  . PARoxetine  37.5 mg Oral Daily  . potassium chloride  40 mEq Oral Daily  . pregabalin  150 mg Oral TID  . sodium chloride  3 mL Intravenous Q12H    Infusions:    PRN Medications: sodium chloride, acetaminophen, dextrose, ondansetron (ZOFRAN) IV, sodium chloride   Assessment:   1. A/C Systolic/diastolic Heart Failure  2. Uncontrolled DM - glucose > 1000 in February 2015  3. CAD MI 2000/2007 BMS - on Asprin and statin  4. Hypothyroid  5. Obesity  6. RBBB  7. History of TIA  Plan/Discussion:    [name] is a 63 year old with A/C systolic heart failure admitted from HF clinic with marked volume overload despite escalating diuretic regimen at home.   Poor response to escalating diuretics. Will plan RHC today to assess volume status and pulmonary pressures. Encouraged her to not refuse insulin.  Susan [name] Susan Nubia Ziesmer,MD 9:11 AM

## 2013-09-22 NOTE — Progress Notes (Signed)
Pt lunch CBG [id number], MD notified, new orders received, will continue to monitor

## 2013-09-22 NOTE — Clinical Documentation Improvement (Signed)
Possible Clinical conditions  Morbid Obesity W/ BMI 47  Other condition___________________  Cannot clinically determine _____________  Risk Factors: Per RD Plan of Care: BMI = 47. Pt meets criteria for class 3, extreme/morbid obesity based on current BMI.  Sign & Symptoms: Ht. 5' 5.5"  Wt. 279   BMI  47  Treatment RD consult: Nutrition education  Thank You, Estella Husk ,RN Clinical Documentation Specialist:  [facility] [employer] [facility]

## 2013-09-22 NOTE — Interval H&P Note (Signed)
History and Physical Interval Note:  09/22/2013 4:09 PM  Susan [name]  has presented today for surgery, with the diagnosis of heart failure  The various methods of treatment have been discussed with the patient and family. After consideration of risks, benefits and other options for treatment, the patient has consented to  Procedure(s): RIGHT HEART CATH (N/A) as a surgical intervention .  The patient's history has been reviewed, patient examined, no change in status, stable for surgery.  I have reviewed the patient's chart and labs.  Questions were answered to the patient's satisfaction.     Shaune Pascal Ardythe Klute

## 2013-09-22 NOTE — Progress Notes (Signed)
Advanced Heart Failure Rounding Note  Primary Cardiologist: Dr Ron Parker  Endocrinologist: Dr Bubba Camp  Subjective:    Susan [name] is a 63 year old with a history of RBBB, DM, Hypothyroid, diastolic heart failure EF 2014 40-45% (2012 60%) , CAD MI 2000/2007 BMS 2007, TIA, obesity.   She was referrred to HF clinic in February by Dr Ron Parker. She had marked volume overload and she was transitioned from lasix to 40 mg torsemide twice a day. Initially her weight went down from 284 to 265 pounds. It should also be noted at that end of March her glucose was 499 she was instructed to follow up with endocrinology however she declined. She was also referred to pulmonary for a sleep study however she cancelled due to cost.   Metolazone added yesterday. No significant increase in u/o. I/Os not recorded. Weight done personally (279) - unchanged. Still feels bloated. CBGs > 400 in setting of her refusing insulin.   ECHO with normal LV and RV function. PA pressures unable to be assessed due to lack of TR.    Objective:   Weight Range:  Vital Signs:   Temp:  [97.7 F (36.5 C)-97.9 F (36.6 C)] 97.7 F (36.5 C) (04/30 0644) Pulse Rate:  [91-103] 91 (04/30 0644) Resp:  [20] 20 (04/29 2100) BP: (118-142)/(54-108) 142/81 mmHg (04/30 0644) SpO2:  [95 %-97 %] 95 % (04/30 0644) Weight:  [126.554 kg (279 lb)] 126.554 kg (279 lb) (04/30 0900)    Weight change: Filed Weights   09/21/13 0900 09/22/13 0900  Weight: 126.916 kg (279 lb 12.8 oz) 126.554 kg (279 lb)    Intake/Output:  No intake or output data in the 24 hours ending 09/22/13 0911   Physical Exam: General: sitting in chair No respiratory difficulty  HEENT: normal  Neck: supple. JVP hard to see appears elevated to jaw. Carotids 2+ bilat; no bruits. No lymphadenopathy or thryomegaly appreciated.  Cor: PMI nondisplaced. Regular rate & rhythm. No rubs, gallops or murmurs.  Lungs: clear  Abdomen: Obese, soft, nontender, +distended. No hepatosplenomegaly.  No bruits or masses. Good bowel sounds.  Extremities: no cyanosis, clubbing, rash, R and LLE 2+ edema extending to thighs.  Neuro: alert & oriented x 3, cranial nerves grossly intact. moves all 4 extremities w/o difficulty. Affect pleasant.  Labs: Basic Metabolic Panel:  Recent Labs Lab 09/20/13 0834 09/20/13 1310 09/20/13 2059 09/21/13 0105 09/21/13 1945 09/22/13 0600  NA 137  --  138 139 133* 138  K 4.0  --  3.9 3.7 4.1 3.6*  CL 95*  --  96 95* 90* 95*  CO2 24  --  27 30 26 29   GLUCOSE 367*  --  141* 184* 370* 193*  BUN 58*  --  58* 60* 63* 61*  CREATININE 1.17* 1.22* 1.26* 1.14* 1.40* 1.18*  CALCIUM 8.9  --  9.5 9.5 9.4 9.3  MG  --   --   --  1.7  --   --     Liver Function Tests: No results found for this basename: AST, ALT, ALKPHOS, BILITOT, PROT, ALBUMIN,  in the last 168 hours No results found for this basename: LIPASE, AMYLASE,  in the last 168 hours No results found for this basename: AMMONIA,  in the last 168 hours  CBC:  Recent Labs Lab 09/20/13 1310  WBC 8.0  HGB 12.4  HCT 37.7  MCV 79.9  PLT 202    Cardiac Enzymes:  Recent Labs Lab 09/20/13 1310 09/20/13 1956 09/21/13 0105  TROPONINI <  0.30 <0.30 <0.30    BNP: BNP (last 3 results)  Recent Labs  09/20/13 0834  PROBNP 236.3*     Imaging: Dg Chest Port 1 View  09/21/2013   CLINICAL DATA:  Dyspnea  EXAM: PORTABLE CHEST - 1 VIEW  COMPARISON:  DG CHEST 2 VIEW dated 08/11/2013  FINDINGS: Low lung volumes. Cardiac silhouette within normal limits. The lungs are clear. No acute osseous abnormalities.  IMPRESSION: Low lung volumes.  No active disease.   Electronically Signed   By: Margaree Mackintosh M.D.   On: 09/21/2013 08:15     Medications:     Scheduled Medications: . aspirin EC  81 mg Oral Daily  . atorvastatin  80 mg Oral q1800  . carvedilol  6.25 mg Oral BID WC  . Chlorhexidine Gluconate Cloth  6 each Topical Q0600  . clopidogrel  75 mg Oral Q breakfast  . enoxaparin (LOVENOX) injection   60 mg Subcutaneous Q24H  . furosemide  80 mg Intravenous BID  . insulin aspart  0-9 Units Subcutaneous TID WC  . insulin aspart protamine- aspart  50 Units Subcutaneous BID WC  . levothyroxine  50 mcg Oral QAC breakfast  . losartan  50 mg Oral Daily  . metolazone  5 mg Oral BID  . mupirocin ointment  1 application Nasal BID  . PARoxetine  37.5 mg Oral Daily  . potassium chloride  40 mEq Oral Daily  . pregabalin  150 mg Oral TID  . sodium chloride  3 mL Intravenous Q12H    Infusions:    PRN Medications: sodium chloride, acetaminophen, dextrose, ondansetron (ZOFRAN) IV, sodium chloride   Assessment:   1. A/C Systolic/diastolic Heart Failure  2. Uncontrolled DM - glucose > 1000 in February 2015  3. CAD MI 2000/2007 BMS - on Asprin and statin  4. Hypothyroid  5. Obesity  6. RBBB  7. History of TIA  Plan/Discussion:    Susan Hanak is a 63 year old with A/C systolic heart failure admitted from HF clinic with marked volume overload despite escalating diuretic regimen at home.   Poor response to escalating diuretics. Will plan RHC today to assess volume status and pulmonary pressures. Encouraged her to not refuse insulin.  Susan Pascal [place],MD 9:11 AM

## 2013-09-23 ENCOUNTER — Encounter (HOSPITAL_COMMUNITY): Payer: Self-pay | Admitting: Anesthesiology

## 2013-09-23 ENCOUNTER — Other Ambulatory Visit: Payer: Self-pay | Admitting: Physician Assistant

## 2013-09-23 DIAGNOSIS — E669 Obesity, unspecified: Secondary | ICD-10-CM

## 2013-09-23 LAB — BASIC METABOLIC PANEL
BUN: 60 mg/dL — ABNORMAL HIGH (ref 6–23)
CO2: 29 mEq/L (ref 19–32)
Calcium: 9.2 mg/dL (ref 8.4–10.5)
Chloride: 96 mEq/L (ref 96–112)
Creatinine, Ser: 1.3 mg/dL — ABNORMAL HIGH (ref 0.50–1.10)
GFR calc Af Amer: 49 mL/min — ABNORMAL LOW (ref >90)
GFR calc non Af Amer: 42 mL/min — ABNORMAL LOW (ref >90)
Glucose, Bld: 322 mg/dL — ABNORMAL HIGH (ref 70–99)
Potassium: 4.1 mEq/L (ref 3.7–5.3)
Sodium: 138 mEq/L (ref 137–147)

## 2013-09-23 MED ORDER — TORSEMIDE 20 MG PO TABS: 60.0000 mg | ORAL_TABLET | Freq: Every day | ORAL | Status: DC

## 2013-09-23 NOTE — Discharge Summary (Signed)
Advanced Heart Failure Team  Discharge Summary   Patient ID: Susan [name] MRN: [id number], DOB/AGE: July 18, 1950 63 y.o. Admit date: 09/20/2013 D/C date:     09/23/2013   Primary Cardiologist: Dr Myrtis Ser  Endocrinologist: Dr Lurene Shadow   Primary Discharge Diagnoses:  1) Dyspnea  Secondary Discharge Diagnoses:  1) Chronic diastolic heart failure - ECHO (09/21/13) showed EF 55-60% and RV function - RHC (09/22/13) RA 4, RV 30/5/7, PA 25/10 (16), PCWP 7, Fick CO/CI 6.3/2.7, PVR 1.5 WU, PA sat 61 and 66% 2) Uncontrolled DM2 3) Venous insufficiency 4) CAD - MI 2000 and 2007 with BMS 5) Morbid Obesity 6) RBBB  Hospital Course:   Susan [name] is a 63 year old with a history of RBBB, DM, Hypothyroid, diastolic heart failure, CAD MI 2000/2007 BMS 2007, TIA and obesity.   She presented to her HF clinic appointment on 09/20/13 with increased SOB and LE edema. She was given 80 mg IV lasix with minimal UOP and decision was made to admit her to the hospital. Pertinent labs on admission were pro-BNP 263, K+ 4.0, creatinine 1.17, Hgb A1C 11.1 and troponin <0.30. She was started on 80 mg IV lasix BID with metolazone and d/t her UOP being sluggish she was scheduled for RHC. She was taken for RHC on 4/30 which showed normal hemodynamics and her lasix was stopped and transitioned back to her home PO torsemide. During her stay she struggled with high blood sugars and at one point refused her insulin and her blood sugar reached over 500. Lengthy discussion with her about the need to take medications as prescribed and the risks of uncontrolled DM2.   Repeat ECHO (09/21/13) showed normal LV and RV function. On the day of discharge she had no complaints and denied any SOB, orthopnea or CP. Her LE edema improved some, however it was attributed to her venous insufficiency. She will be sent home with close follow up in the HF clinic next week.  Discharge Weight Range: 277-279 lbs Discharge Vitals: Blood pressure 117/67, pulse 100,  temperature 98.4 F (36.9 C), temperature source Oral, resp. rate 18, weight 277 lb 3.2 oz (125.737 kg), SpO2 98.00%.  Labs: Lab Results  Component Value Date   WBC 8.8 09/22/2013   HGB 13.4 09/22/2013   HCT 41.0 09/22/2013   MCV 80.2 09/22/2013   PLT 237 09/22/2013     Recent Labs Lab 09/23/13 0500  NA 138  K 4.1  CL 96  CO2 29  BUN 60*  CREATININE 1.30*  CALCIUM 9.2  GLUCOSE 322*   No results found for this basename: CHOL,  HDL,  LDLCALC,  TRIG   BNP (last 3 results)  Recent Labs  09/20/13 0834  PROBNP 236.3*    Diagnostic Studies/Procedures   No results found.  Discharge Medications     Medication List         albuterol 108 (90 BASE) MCG/ACT inhaler  Commonly known as:  PROVENTIL HFA;VENTOLIN HFA  Inhale 2 puffs into the lungs every 6 (six) hours as needed for wheezing or shortness of breath.     aspirin EC 81 MG tablet  Take 81 mg by mouth daily.     atorvastatin 80 MG tablet  Commonly known as:  LIPITOR  Take 80 mg by mouth daily at 6 PM.     carvedilol 12.5 MG tablet  Commonly known as:  COREG  Take 12.5 mg by mouth 2 (two) times daily with a meal.     clonazePAM 1 MG tablet  Commonly known as:  KLONOPIN  Take 1 tablet (1 mg total) by mouth 3 (three) times daily as needed for anxiety.     clopidogrel 75 MG tablet  Commonly known as:  PLAVIX  Take 75 mg by mouth daily.     fish oil-omega-3 fatty acids 1000 MG capsule  Take 1 g by mouth daily.     insulin NPH-regular Human (70-30) 100 UNIT/ML injection  Commonly known as:  NOVOLIN 70/30  Inject 30-60 Units into the skin 3 (three) times daily with meals. Take 60 units in am , 30 units at lunch, and 60 units in the evening     isosorbide dinitrate 10 MG tablet  Commonly known as:  ISORDIL  Take 10 mg by mouth 2 (two) times daily.     levothyroxine 50 MCG tablet  Commonly known as:  SYNTHROID, LEVOTHROID  Take 50 mcg by mouth daily before breakfast.     losartan 50 MG tablet  Commonly  known as:  COZAAR  Take 50 mg by mouth daily.     metolazone 5 MG tablet  Commonly known as:  ZAROXOLYN  Take 5 mg by mouth daily as needed (fluid).     multivitamin with minerals Tabs tablet  Take 1 tablet by mouth daily.     nitroGLYCERIN 0.4 MG SL tablet  Commonly known as:  NITROSTAT  Place 1 tablet (0.4 mg total) under the tongue every 5 (five) minutes as needed for chest pain.     PARoxetine 37.5 MG 24 hr tablet  Commonly known as:  PAXIL-CR  Take 37.5 mg by mouth daily.     Potassium Chloride ER 20 MEQ Tbcr  Take 40 mEq by mouth daily.     pregabalin 150 MG capsule  Commonly known as:  LYRICA  Take 150 mg by mouth 3 (three) times daily.     torsemide 20 MG tablet  Commonly known as:  DEMADEX  Take 4 tablets (80 mg total) by mouth daily.     triamcinolone cream 0.1 %  Commonly known as:  KENALOG  Apply 1 application topically 2 (two) times daily.     VITAMIN B12 PO  Take 1 tablet by mouth daily.     VITAMIN C PO  Take 1 tablet by mouth daily.     VITAMIN D PO  Take 1 tablet by mouth daily.        Disposition   The patient will be discharged in stable condition to home. Discharge Orders   Future Appointments Provider Department Dept Phone   09/29/2013 2:30 PM [facility] [phone]   11/09/2013 3:00 PM Luis Abed, MD [facility] [place] Office [phone]   02/13/2014 2:00 PM Dorena Bodo, PA-C [facility] [phone]   Future Orders Complete By Expires   ACE Inhibitor / ARB already ordered  As directed    Beta Blocker already ordered  As directed    Diet - low sodium heart healthy  As directed    Heart Failure patients record your daily weight using the same scale at the same time of day  As directed    Increase activity slowly  As directed    STOP any activity that causes chest pain, shortness of breath, dizziness, sweating, or exessive weakness  As directed       Follow-up Information   Follow up with [facility] On 09/29/2013. (@ 2:30 pm;  Gate Code 0040; Please bring all your medications to your visit)    Specialty:  Cardiology   Contact information:   [address] [id number] [place] [address] [phone]        Duration of Discharge Encounter: Greater than 35 minutes   Signed, Aundria Rud NP-C 09/23/2013, 1:56 PM  Patient seen and examined with Ulla Potash, NP. We discussed all aspects of the encounter. I agree with the assessment and plan as stated above. RHC data reviewed with her. She is ready for d/c today.  Bevelyn Buckles Aissata Wilmore,MD 2:40 PM

## 2013-09-23 NOTE — Progress Notes (Signed)
Advanced Heart Failure Rounding Note  Primary Cardiologist: Dr Ron Parker  Endocrinologist: Dr Bubba Camp  Subjective:    [name] is a 63 year old with a history of RBBB, DM, Hypothyroid, diastolic heart failure EF 2014 40-45% (2012 60%) , CAD MI 2000/2007 BMS 2007, TIA, obesity.   She was referrred to HF clinic in February by Dr Ron Parker. She had marked volume overload and she was transitioned from lasix to 40 mg torsemide twice a day. Initially her weight went down from 284 to 265 pounds. It should also be noted at that end of March her glucose was 499 she was instructed to follow up with endocrinology however she declined. She was also referred to pulmonary for a sleep study however she cancelled due to cost.   Admitted from clinic on 4/28. UOP sluggish and was taken for RHC yesterday (4/30) showing normal hemodynamics. Denies SOB, orthopnea or CP.  ECHO (4/29) with normal LV and RV function. PA pressures unable to be assessed due to lack of TR.    Objective:   Weight Range:  Vital Signs:   Temp:  [97.7 F (36.5 C)-98.3 F (36.8 C)] 98.3 F (36.8 C) (05/01 0604) Pulse Rate:  [60-100] 100 (05/01 0604) Resp:  [18] 18 (05/01 0604) BP: (115-136)/(68-78) 133/77 mmHg (05/01 0604) SpO2:  [96 %-100 %] 98 % (05/01 0604) Weight:  [277 lb 3.2 oz (125.737 kg)] 277 lb 3.2 oz (125.737 kg) (05/01 0604) Last BM Date: 09/22/13  Weight change: Filed Weights   09/21/13 0900 09/22/13 0900 09/23/13 0604  Weight: 279 lb 12.8 oz (126.916 kg) 279 lb (126.554 kg) 277 lb 3.2 oz (125.737 kg)    Intake/Output:   Intake/Output Summary (Last 24 hours) at 09/23/13 1029 Last data filed at 09/22/13 2045  Gross per 24 hour  Intake      0 ml  Output   1150 ml  Net  -1150 ml     Physical Exam: General: sitting in chair No respiratory difficulty  HEENT: normal  Neck: supple. JVP difficult to assess d/t body habitus. Carotids 2+ bilat; no bruits. No lymphadenopathy or thryomegaly appreciated.  Cor: PMI  nondisplaced. Regular rate & rhythm. No rubs, gallops or murmurs.  Lungs: clear  Abdomen: Obese, soft, nontender, non-distended. No hepatosplenomegaly. No bruits or masses. Good bowel sounds.  Extremities: no cyanosis, clubbing, rash, bilateral 1+ edema Neuro: alert & oriented x 3, cranial nerves grossly intact. moves all 4 extremities w/o difficulty. Affect pleasant.  Labs: Basic Metabolic Panel:  Recent Labs Lab 09/21/13 0105 09/21/13 1945 09/22/13 0600 09/22/13 1245 09/22/13 1836 09/23/13 0500  NA 139 133* 138 133*  --  138  K 3.7 4.1 3.6* 4.8  --  4.1  CL 95* 90* 95* 89*  --  96  CO2 30 26 29 29   --  29  GLUCOSE 184* 370* 193* 471*  --  322*  BUN 60* 63* 61* 62*  --  60*  CREATININE 1.14* 1.40* 1.18* 1.34* 1.40* 1.30*  CALCIUM 9.5 9.4 9.3 9.5  --  9.2  MG 1.7  --   --   --   --   --     Liver Function Tests: No results found for this basename: AST, ALT, ALKPHOS, BILITOT, PROT, ALBUMIN,  in the last 168 hours No results found for this basename: LIPASE, AMYLASE,  in the last 168 hours No results found for this basename: AMMONIA,  in the last 168 hours  CBC:  Recent Labs Lab 09/20/13 1310 09/22/13 1836  WBC 8.0 8.8  HGB 12.4 13.4  HCT 37.7 41.0  MCV 79.9 80.2  PLT 202 237    Cardiac Enzymes:  Recent Labs Lab 09/20/13 1310 09/20/13 1956 09/21/13 0105  TROPONINI <0.30 <0.30 <0.30    BNP: BNP (last 3 results)  Recent Labs  09/20/13 0834  PROBNP 236.3*     Imaging: No results found.   Medications:     Scheduled Medications: . aspirin EC  81 mg Oral Daily  . atorvastatin  80 mg Oral q1800  . carvedilol  6.25 mg Oral BID WC  . Chlorhexidine Gluconate Cloth  6 each Topical Q0600  . clopidogrel  75 mg Oral Q breakfast  . enoxaparin (LOVENOX) injection  60 mg Subcutaneous Q24H  . insulin aspart  0-15 Units Subcutaneous TID WC  . insulin aspart protamine- aspart  30 Units Subcutaneous Q lunch  . insulin aspart protamine- aspart  60 Units  Subcutaneous BID WC  . levothyroxine  50 mcg Oral QAC breakfast  . losartan  50 mg Oral Daily  . mupirocin ointment  1 application Nasal BID  . PARoxetine  37.5 mg Oral Daily  . pregabalin  150 mg Oral TID  . sodium chloride  3 mL Intravenous Q12H  . sodium chloride  3 mL Intravenous Q12H    Infusions:    PRN Medications: sodium chloride, sodium chloride, acetaminophen, dextrose, ondansetron (ZOFRAN) IV, sodium chloride, sodium chloride   Assessment:   1. A/C Systolic/diastolic Heart Failure  2. Uncontrolled DM - glucose > 1000 in February 2015  3. CAD MI 2000/2007 BMS - on Asprin and statin  4. Hypothyroid  5. Obesity  6. RBBB  7. History of TIA  Plan/Discussion:    [name] is a 63 year old with chronic systolic HF who was admitted from clinic earlier this week with concern for volume overload. Diuresis was sluggish and taken for RHC yesterday (4/30) showing normal hemodynamics.  Creatinine stable. Will transition back to PO diuretics will decrease to 60 mg daily of demadex. Can go home today.   LE edema likely d/t venous insufficiency. Blood sugars better controlled today, will need to follow up with PCP about her DM2.   Susan Brunt, Susan [name] 10:29 AM  Patient seen and examined with Susan Bame, NP. We discussed all aspects of the encounter. I agree with the assessment and plan as stated above.   Susan [name] reviewed with her and her family. She is ok to be discharged today on above plan.  Susan Dutta R Ilijah Doucet,MD 11:06 AM

## 2013-09-23 NOTE — Discharge Instructions (Signed)
Heart Failure °Heart failure is a condition in which the heart has trouble pumping blood. This means your heart does not pump blood efficiently for your body to work well. In some cases of heart failure, fluid may back up into your lungs or you may have swelling (edema) in your lower legs. Heart failure is usually a long-term (chronic) condition. It is important for you to take good care of yourself and follow your caregiver's treatment plan. °CAUSES  °Some health conditions can cause heart failure. Those health conditions include: °· High blood pressure (hypertension) causes the heart muscle to work harder than normal. When pressure in the blood vessels is high, the heart needs to pump (contract) with more force in order to circulate blood throughout the body. High blood pressure eventually causes the heart to become stiff and weak. °· Coronary artery disease (CAD) is the buildup of cholesterol and fat (plaque) in the arteries of the heart. The blockage in the arteries deprives the heart muscle of oxygen and blood. This can cause chest pain and may lead to a heart attack. High blood pressure can also contribute to CAD. °· Heart attack (myocardial infarction) occurs when 1 or more arteries in the heart become blocked. The loss of oxygen damages the muscle tissue of the heart. When this happens, part of the heart muscle dies. The injured tissue does not contract as well and weakens the heart's ability to pump blood. °· Abnormal heart valves can cause heart failure when the heart valves do not open and close properly. This makes the heart muscle pump harder to keep the blood flowing. °· Heart muscle disease (cardiomyopathy or myocarditis) is damage to the heart muscle from a variety of causes. These can include drug or alcohol abuse, infections, or unknown reasons. These can increase the risk of heart failure. °· Lung disease makes the heart work harder because the lungs do not work properly. This can cause a strain  on the heart, leading it to fail. °· Diabetes increases the risk of heart failure. High blood sugar contributes to high fat (lipid) levels in the blood. Diabetes can also cause slow damage to tiny blood vessels that carry important nutrients to the heart muscle. When the heart does not get enough oxygen and food, it can cause the heart to become weak and stiff. This leads to a heart that does not contract efficiently. °· Other conditions can contribute to heart failure. These include abnormal heart rhythms, thyroid problems, and low blood counts (anemia). °Certain unhealthy behaviors can increase the risk of heart failure. Those unhealthy behaviors include: °· Being overweight. °· Smoking or chewing tobacco. °· Eating foods high in fat and cholesterol. °· Abusing illicit drugs or alcohol. °· Lacking physical activity. °SYMPTOMS  °Heart failure symptoms may vary and can be hard to detect. Symptoms may include: °· Shortness of breath with activity, such as climbing stairs. °· Persistent cough. °· Swelling of the feet, ankles, legs, or abdomen. °· Unexplained weight gain. °· Difficulty breathing when lying flat (orthopnea). °· Waking from sleep because of the need to sit up and get more air. °· Rapid heartbeat. °· Fatigue and loss of energy. °· Feeling lightheaded, dizzy, or close to fainting. °· Loss of appetite. °· Nausea. °· Increased urination during the night (nocturia). °DIAGNOSIS  °A diagnosis of heart failure is based on your history, symptoms, physical examination, and diagnostic tests. °Diagnostic tests for heart failure may include: °· Echocardiography. °· Electrocardiography. °· Chest X-ray. °· Blood tests. °· Exercise   stress test. °· Cardiac angiography. °· Radionuclide scans. °TREATMENT  °Treatment is aimed at managing the symptoms of heart failure. Medicines, behavioral changes, or surgical intervention may be necessary to treat heart failure. °· Medicines to help treat heart failure may  include: °· Angiotensin-converting enzyme (ACE) inhibitors. This type of medicine blocks the effects of a blood protein called angiotensin-converting enzyme. ACE inhibitors relax (dilate) the blood vessels and help lower blood pressure. °· Angiotensin receptor blockers. This type of medicine blocks the actions of a blood protein called angiotensin. Angiotensin receptor blockers dilate the blood vessels and help lower blood pressure. °· Water pills (diuretics). Diuretics cause the kidneys to remove salt and water from the blood. The extra fluid is removed through urination. This loss of extra fluid lowers the volume of blood the heart pumps. °· Beta blockers. These prevent the heart from beating too fast and improve heart muscle strength. °· Digitalis. This increases the force of the heartbeat. °· Healthy behavior changes include: °· Obtaining and maintaining a healthy weight. °· Stopping smoking or chewing tobacco. °· Eating heart healthy foods. °· Limiting or avoiding alcohol. °· Stopping illicit drug use. °· Physical activity as directed by your caregiver. °· Surgical treatment for heart failure may include: °· A procedure to open blocked arteries, repair damaged heart valves, or remove damaged heart muscle tissue. °· A pacemaker to improve heart muscle function and control certain abnormal heart rhythms. °· An internal cardioverter defibrillator to treat certain serious abnormal heart rhythms. °· A left ventricular assist device to assist the pumping ability of the heart. °HOME CARE INSTRUCTIONS  °· Take your medicine as directed by your caregiver. Medicines are important in reducing the workload of your heart, slowing the progression of heart failure, and improving your symptoms. °· Do not stop taking your medicine unless directed by your caregiver. °· Do not skip any dose of medicine. °· Refill your prescriptions before you run out of medicine. Your medicines are needed every day. °· Take over-the-counter  medicine only as directed by your caregiver or pharmacist. °· Engage in moderate physical activity if directed by your caregiver. Moderate physical activity can benefit some people. The elderly and people with severe heart failure should consult with a caregiver for physical activity recommendations. °· Eat heart healthy foods. Food choices should be free of trans fat and low in saturated fat, cholesterol, and salt (sodium). Healthy choices include fresh or frozen fruits and vegetables, fish, lean meats, legumes, fat-free or low-fat dairy products, and whole grain or high fiber foods. Talk to a dietitian to learn more about heart healthy foods. °· Limit sodium if directed by your caregiver. Sodium restriction may reduce symptoms of heart failure in some people. Talk to a dietitian to learn more about heart healthy seasonings. °· Use healthy cooking methods. Healthy cooking methods include roasting, grilling, broiling, baking, poaching, steaming, or stir-frying. Talk to a dietitian to learn more about healthy cooking methods. °· Limit fluids if directed by your caregiver. Fluid restriction may reduce symptoms of heart failure in some people. °· Weigh yourself every day. Daily weights are important in the early recognition of excess fluid. You should weigh yourself every morning after you urinate and before you eat breakfast. Wear the same amount of clothing each time you weigh yourself. Record your daily weight. Provide your caregiver with your weight record. °· Monitor and record your blood pressure if directed by your caregiver. °· Check your pulse if directed by your caregiver. °· Lose weight if directed   by your caregiver. Weight loss may reduce symptoms of heart failure in some people. °· Stop smoking or chewing tobacco. Nicotine makes your heart work harder by causing your blood vessels to constrict. Do not use nicotine gum or patches before talking to your caregiver. °· Schedule and attend follow-up visits as  directed by your caregiver. It is important to keep all your appointments. °· Limit alcohol intake to no more than 1 drink per day for nonpregnant women and 2 drinks per day for men. Drinking more than that is harmful to your heart. Tell your caregiver if you drink alcohol several times a week. Talk with your caregiver about whether alcohol is safe for you. If your heart has already been damaged by alcohol or you have severe heart failure, drinking alcohol should be stopped completely. °· Stop illicit drug use. °· Stay up-to-date with immunizations. It is especially important to prevent respiratory infections through current pneumococcal and influenza immunizations. °· Manage other health conditions such as hypertension, diabetes, thyroid disease, or abnormal heart rhythms as directed by your caregiver. °· Learn to manage stress. °· Plan rest periods when fatigued. °· Learn strategies to manage high temperatures. If the weather is extremely hot: °· Avoid vigorous physical activity. °· Use air conditioning or fans or seek a cooler location. °· Avoid caffeine and alcohol. °· Wear loose-fitting, lightweight, and light-colored clothing. °· Learn strategies to manage cold temperatures. If the weather is extremely cold: °· Avoid vigorous physical activity. °· Layer clothes. °· Wear mittens or gloves, a hat, and a scarf when going outside. °· Avoid alcohol. °· Obtain ongoing education and support as needed. °· Participate or seek rehabilitation as needed to maintain or improve independence and quality of life. °SEEK MEDICAL CARE IF:  °· Your weight increases by 03 lb/1.4 kg in 1 day or 05 lb/2.3 kg in a week. °· You have increasing shortness of breath that is unusual for you. °· You are unable to participate in your usual physical activities. °· You tire easily. °· You cough more than normal, especially with physical activity. °· You have any or more swelling in areas such as your hands, feet, ankles, or abdomen. °· You  are unable to sleep because it is hard to breathe. °· You feel like your heart is beating fast (palpitations). °· You become dizzy or lightheaded upon standing up. °SEEK IMMEDIATE MEDICAL CARE IF:  °· You have difficulty breathing. °· There is a change in mental status such as decreased alertness or difficulty with concentration. °· You have a pain or discomfort in your chest. °· You have an episode of fainting (syncope). °MAKE SURE YOU:  °· Understand these instructions. °· Will watch your condition. °· Will get help right away if you are not doing well or get worse. °Document Released: 05/12/2005 Document Revised: 09/06/2012 Document Reviewed: 06/03/2012 °[employer]® Patient Information ©2014 [employer]. ° °

## 2013-09-23 NOTE — Telephone Encounter (Signed)
Refill appropriate and filled per protocol. 

## 2013-09-28 ENCOUNTER — Telehealth (HOSPITAL_COMMUNITY): Payer: Self-pay | Admitting: Cardiology

## 2013-09-28 NOTE — Telephone Encounter (Signed)
Pt admitted for further evaluation 09/20/13-09/23/13 Cpt code [id number] [phone] With pt current insurance- [facility] Per cert # [id number]

## 2013-09-29 ENCOUNTER — Ambulatory Visit (HOSPITAL_COMMUNITY)
Admit: 2013-09-29 | Discharge: 2013-09-29 | Disposition: A | Discharge status: Home / Self Care | Payer: Commercial Managed Care - PPO | Attending: Internal Medicine | Admitting: Internal Medicine

## 2013-09-29 VITALS — BP 123/76 | HR 93 | Resp 20 | Wt 285.5 lb

## 2013-09-29 DIAGNOSIS — I4510 Unspecified right bundle-branch block: Secondary | ICD-10-CM | POA: Insufficient documentation

## 2013-09-29 DIAGNOSIS — E039 Hypothyroidism, unspecified: Secondary | ICD-10-CM | POA: Insufficient documentation

## 2013-09-29 DIAGNOSIS — G609 Hereditary and idiopathic neuropathy, unspecified: Secondary | ICD-10-CM | POA: Insufficient documentation

## 2013-09-29 DIAGNOSIS — I2510 Atherosclerotic heart disease of native coronary artery without angina pectoris: Secondary | ICD-10-CM | POA: Insufficient documentation

## 2013-09-29 DIAGNOSIS — N189 Chronic kidney disease, unspecified: Secondary | ICD-10-CM | POA: Insufficient documentation

## 2013-09-29 DIAGNOSIS — I129 Hypertensive chronic kidney disease with stage 1 through stage 4 chronic kidney disease, or unspecified chronic kidney disease: Secondary | ICD-10-CM | POA: Insufficient documentation

## 2013-09-29 DIAGNOSIS — E1165 Type 2 diabetes mellitus with hyperglycemia: Secondary | ICD-10-CM

## 2013-09-29 DIAGNOSIS — Z7982 Long term (current) use of aspirin: Secondary | ICD-10-CM | POA: Insufficient documentation

## 2013-09-29 DIAGNOSIS — Z79899 Other long term (current) drug therapy: Secondary | ICD-10-CM | POA: Insufficient documentation

## 2013-09-29 DIAGNOSIS — Z8673 Personal history of transient ischemic attack (TIA), and cerebral infarction without residual deficits: Secondary | ICD-10-CM | POA: Insufficient documentation

## 2013-09-29 DIAGNOSIS — R0602 Shortness of breath: Secondary | ICD-10-CM

## 2013-09-29 DIAGNOSIS — IMO0001 Reserved for inherently not codable concepts without codable children: Secondary | ICD-10-CM | POA: Insufficient documentation

## 2013-09-29 DIAGNOSIS — I5030 Unspecified diastolic (congestive) heart failure: Secondary | ICD-10-CM | POA: Insufficient documentation

## 2013-09-29 DIAGNOSIS — E785 Hyperlipidemia, unspecified: Secondary | ICD-10-CM | POA: Insufficient documentation

## 2013-09-29 DIAGNOSIS — I10 Essential (primary) hypertension: Secondary | ICD-10-CM

## 2013-09-29 DIAGNOSIS — I872 Venous insufficiency (chronic) (peripheral): Secondary | ICD-10-CM | POA: Insufficient documentation

## 2013-09-29 DIAGNOSIS — I5032 Chronic diastolic (congestive) heart failure: Secondary | ICD-10-CM

## 2013-09-29 DIAGNOSIS — I252 Old myocardial infarction: Secondary | ICD-10-CM | POA: Insufficient documentation

## 2013-09-29 DIAGNOSIS — I6529 Occlusion and stenosis of unspecified carotid artery: Secondary | ICD-10-CM | POA: Insufficient documentation

## 2013-09-29 DIAGNOSIS — Z87891 Personal history of nicotine dependence: Secondary | ICD-10-CM | POA: Insufficient documentation

## 2013-09-29 DIAGNOSIS — E669 Obesity, unspecified: Secondary | ICD-10-CM | POA: Insufficient documentation

## 2013-09-29 DIAGNOSIS — Z7902 Long term (current) use of antithrombotics/antiplatelets: Secondary | ICD-10-CM | POA: Insufficient documentation

## 2013-09-29 DIAGNOSIS — I509 Heart failure, unspecified: Secondary | ICD-10-CM

## 2013-09-29 DIAGNOSIS — I5023 Acute on chronic systolic (congestive) heart failure: Secondary | ICD-10-CM

## 2013-09-29 LAB — BASIC METABOLIC PANEL
BUN: 78 mg/dL — ABNORMAL HIGH (ref 6–23)
CO2: 24 mEq/L (ref 19–32)
Calcium: 9.1 mg/dL (ref 8.4–10.5)
Chloride: 100 mEq/L (ref 96–112)
Creatinine, Ser: 1.41 mg/dL — ABNORMAL HIGH (ref 0.50–1.10)
GFR calc Af Amer: 45 mL/min — ABNORMAL LOW (ref >90)
GFR calc non Af Amer: 38 mL/min — ABNORMAL LOW (ref >90)
Glucose, Bld: 267 mg/dL — ABNORMAL HIGH (ref 70–99)
Potassium: 4.5 mEq/L (ref 3.7–5.3)
Sodium: 139 mEq/L (ref 137–147)

## 2013-09-29 NOTE — Patient Instructions (Signed)
Keep medications the same.  Try to be as active as possible.  Try to watch your portion sizes and eat more protein.  Call any issues.  F/U 6 weeks  Do the following things EVERYDAY: 1) Weigh yourself in the morning before breakfast. Write it down and keep it in a log. 2) Take your medicines as prescribed 3) Eat low salt foods-Limit salt (sodium) to 2000 mg per day.  4) Stay as active as you can everyday 5) Limit all fluids for the day to less than 2 liters 6)

## 2013-09-29 NOTE — Progress Notes (Signed)
Patient ID: Susan [name], female   DOB: 16-Apr-1951, 63 y.o.   MRN: [id number]  PCP: Dr Scot Dock  Cardiologist: Dr Ron Parker  Endocrinologist: Dr Bubba Camp   HPI: Susan [name] is a 63 year old with a history of RBBB, DM, Hypothyroid, diastolic heart failure, CAD MI 2000/2007 BMS 2007, TIA, obesity.   [place] 09/22/13: RA = 4  RV = 30/5/7  PA = 25/10 (16)  PCW = 7  Fick cardiac output/index = 6.3/2.7  PVR = 1.5 WU  FA sat = 93%  PA sat = 61%, 66%  Post Hospital F/U: Admitted last week and was thought to be volume overloaded, but was taken for RHC and showed normal LV and RV function with a PCWP of 7. During stay high blood sugars and has follow up 5/19. Doing well. Denies SOB, orthopnea, CP or palpitations. +trace edema. +DOE with minimal exertion. Weight at home 284 lbs. Compliant with medications. Trying follow low salt diet and limit fluid intake to < 2 liters.   Labs: 07/18/13 K 4.8 Creatinine 1.38 Glucose 1005 she refused to go to [facility] Labs : 08/17/13 K 3.9 Creatinine 1.4 Glucose 499  SH: Former smoker quit 15 year ago. Does drink alcohol. She is disabled. Lives at home with her husband and disabled son - he has a brain injury after he attempted suicide 2 years ago.    FH: Mom MI  ROS: All systems negative except as listed in HPI, PMH and Problem List.  Past Medical History  Diagnosis Date  . Tachycardia     Sinus tachycardia  . Dyslipidemia   . Hypothyroidism   . CAD (coronary artery disease)     MI in 2000 - MI  2007 - treated bare metal stent (no nuclear since then as 9/11)  . HTN (hypertension)   . Anemia   . Chronic diastolic heart failure     a) ECHO (08/2013) EF 55-60% and RV function nl b) RHC (08/2013) RA 4, RV 30/5/7, PA 25/10 (16), PCWP 7, Fick CO/CI 6.3/2.7, PVR 1.5 WU, PA 61 and 66%  . RBBB (right bundle branch block)     Old  . Renal insufficiency   . Urinary incontinence   . Obesity   . Syncope     likely due to low blood sugar  . Stroke     mini strokes   . Carotid artery disease   . Acute MI 1999; 2007  . CHF (congestive heart failure)   . Anxiety   . Depression   . Type II diabetes mellitus   . Osteoarthritis   . Arthritis   . Peripheral neuropathy   . Venous insufficiency     Current Outpatient Prescriptions  Medication Sig Dispense Refill  . albuterol (PROVENTIL HFA;VENTOLIN HFA) 108 (90 BASE) MCG/ACT inhaler Inhale 2 puffs into the lungs every 6 (six) hours as needed for wheezing or shortness of breath.      . Ascorbic Acid (VITAMIN C PO) Take 1 tablet by mouth daily.      Marland Kitchen aspirin EC 81 MG tablet Take 81 mg by mouth daily.      Marland Kitchen atorvastatin (LIPITOR) 80 MG tablet Take 80 mg by mouth daily at 6 PM.      . carvedilol (COREG) 12.5 MG tablet Take 12.5 mg by mouth 2 (two) times daily with a meal.      . Cholecalciferol (VITAMIN D PO) Take 1 tablet by mouth daily.      . clonazePAM (KLONOPIN) 1 MG  tablet Take 1 tablet (1 mg total) by mouth 3 (three) times daily as needed for anxiety.  90 tablet  0  . clopidogrel (PLAVIX) 75 MG tablet Take 75 mg by mouth daily.      . Cyanocobalamin (VITAMIN B12 PO) Take 1 tablet by mouth daily.      . fish oil-omega-3 fatty acids 1000 MG capsule Take 1 g by mouth daily.       . insulin NPH-regular Human (NOVOLIN 70/30) (70-30) 100 UNIT/ML injection Inject 30-60 Units into the skin 3 (three) times daily with meals. Take 60 units in am , 30 units at lunch, and 60 units in the evening      . isosorbide dinitrate (ISORDIL) 10 MG tablet Take 10 mg by mouth 2 (two) times daily.      Marland Kitchen levothyroxine (SYNTHROID, LEVOTHROID) 50 MCG tablet Take 50 mcg by mouth daily before breakfast.      . losartan (COZAAR) 50 MG tablet Take 50 mg by mouth daily.      . metolazone (ZAROXOLYN) 5 MG tablet Take 5 mg by mouth daily as needed (fluid).      . Multiple Vitamin (MULTIVITAMIN WITH MINERALS) TABS tablet Take 1 tablet by mouth daily.      . nitroGLYCERIN (NITROSTAT) 0.4 MG SL tablet Place 1 tablet (0.4 mg total) under  the tongue every 5 (five) minutes as needed for chest pain.  25 tablet  3  . PARoxetine (PAXIL-CR) 37.5 MG 24 hr tablet TAKE 1 TABLET BY MOUTH DAILY  30 tablet  3  . Potassium Chloride ER 20 MEQ TBCR Take 40 mEq by mouth daily.  60 tablet  6  . pregabalin (LYRICA) 150 MG capsule Take 150 mg by mouth 3 (three) times daily.      Marland Kitchen torsemide (DEMADEX) 20 MG tablet Take 4 tablets (80 mg total) by mouth daily.  120 tablet  6  . triamcinolone cream (KENALOG) 0.1 % Apply 1 application topically 2 (two) times daily.       No current facility-administered medications for this encounter.    Filed Vitals:   09/29/13 1434  BP: 123/76  Pulse: 93  Resp: 20  Weight: 285 lb 8 oz (129.502 kg)  SpO2: 97%   PHYSICAL EXAM: General:  NAD;  HEENT: normal Neck: supple. JVP difficult to assess d/t body habitus but does not appear elevated.  Carotids 2+ bilaterally; no bruits. No lymphadenopathy or thryomegaly appreciated. Cor: PMI normal. Regular rate & rhythm. No rubs, gallops or murmurs. Lungs: clear Abdomen: obese, soft, nontender, nondistended. No hepatosplenomegaly. No bruits or masses. Good bowel sounds. Extremities: no cyanosis, clubbing, rash, trace LE edema, venous stasis Neuro: alert & orientedx3, cranial nerves grossly intact. Moves all 4 extremities w/o difficulty. Affect pleasant.  ASSESSMENT & PLAN:  1. Chronic Systolic Heart Failure: NICM, EF 55-60% (08/2013)  - Reviewed discharge summary. Patient recently admitted for what was thought to be a/c HF with volume overload, however cath showed normal hemodynamics. - NYHA III symptoms and volume status stable. Will continue torsemide 80 mg daily. - Continue coreg 12.5 mg BID and losartan 50 mg daily. - Check BMET - Reinforced the need and importance of daily weights, a low sodium diet, and fluid restriction (less than 2 L a day). Instructed to call the HF clinic if weight increases more than 3 lbs overnight or 5 lbs in a week 2.. Dyspnea-  Continues to have dyspnea with minimal exertion. Believe that she has obesity hypoventilation syndrome/OSA, however she  will not go to pulmonoloy to have sleep study d/t cost. Fluid looks good.  3. Uncontrolled DM: Blood sugars in the hospital were 500s. Patient has follow up with PCP on 5/19. Discussed in depth with patient the risks of uncontrolled DM2 4. CAD- no evidence of ischemia- on aspirin and statin.  5. Obesity: In depth conversation with patient about the need to lose weight and to watch her portion sizes. Encouraged her to eat more protein and less carbs. Discussed following a 1200-1400 calorie a day diet and to be more active even if she starts out with just 5 minutes of activity a day and increases a little each week. Will get some information for her on the atkins [employer] and give to Susan [name] with paramedic ine to give her. She reports she has not been eating at all and blood sugars were dropping to 30s and we talked about the need to eat frequent small meals.   F/U 6 weeks Rande Brunt NP-C 2:45 PM

## 2013-09-30 ENCOUNTER — Telehealth (HOSPITAL_COMMUNITY): Payer: Self-pay

## 2013-09-30 NOTE — Telephone Encounter (Signed)
Patient made aware of lab results, instructed to hold diuretics for 2 days and resume as prescribed thereafter.  Aware and agreeable. Renee Pain

## 2013-10-11 ENCOUNTER — Other Ambulatory Visit: Payer: Self-pay | Admitting: Cardiology

## 2013-10-12 ENCOUNTER — Other Ambulatory Visit: Payer: Self-pay | Admitting: Physician Assistant

## 2013-10-12 ENCOUNTER — Other Ambulatory Visit: Payer: Self-pay | Admitting: Cardiology

## 2013-10-12 NOTE — Telephone Encounter (Signed)
Medication refilled per protocol. 

## 2013-10-20 ENCOUNTER — Emergency Department (HOSPITAL_COMMUNITY)
Admission: EM | Admit: 2013-10-20 | Discharge: 2013-10-21 | Disposition: A | Discharge status: Home / Self Care | Payer: Commercial Managed Care - PPO | Attending: Emergency Medicine | Admitting: Emergency Medicine

## 2013-10-20 ENCOUNTER — Ambulatory Visit (HOSPITAL_COMMUNITY)
Admission: RE | Admit: 2013-10-20 | Discharge: 2013-10-20 | Disposition: A | Discharge status: Home / Self Care | Payer: Commercial Managed Care - PPO | Source: Ambulatory Visit | Attending: Internal Medicine | Admitting: Internal Medicine

## 2013-10-20 ENCOUNTER — Emergency Department (HOSPITAL_COMMUNITY): Payer: Commercial Managed Care - PPO

## 2013-10-20 ENCOUNTER — Encounter (HOSPITAL_COMMUNITY): Payer: Self-pay | Admitting: Emergency Medicine

## 2013-10-20 VITALS — BP 176/73 | HR 103 | Resp 24 | Wt 304.4 lb

## 2013-10-20 DIAGNOSIS — S43006A Unspecified dislocation of unspecified shoulder joint, initial encounter: Secondary | ICD-10-CM | POA: Insufficient documentation

## 2013-10-20 DIAGNOSIS — S0083XA Contusion of other part of head, initial encounter: Secondary | ICD-10-CM | POA: Insufficient documentation

## 2013-10-20 DIAGNOSIS — I10 Essential (primary) hypertension: Secondary | ICD-10-CM | POA: Insufficient documentation

## 2013-10-20 DIAGNOSIS — Z7902 Long term (current) use of antithrombotics/antiplatelets: Secondary | ICD-10-CM | POA: Insufficient documentation

## 2013-10-20 DIAGNOSIS — Z8673 Personal history of transient ischemic attack (TIA), and cerebral infarction without residual deficits: Secondary | ICD-10-CM | POA: Insufficient documentation

## 2013-10-20 DIAGNOSIS — W010XXA Fall on same level from slipping, tripping and stumbling without subsequent striking against object, initial encounter: Secondary | ICD-10-CM | POA: Insufficient documentation

## 2013-10-20 DIAGNOSIS — L02419 Cutaneous abscess of limb, unspecified: Secondary | ICD-10-CM | POA: Insufficient documentation

## 2013-10-20 DIAGNOSIS — Z7982 Long term (current) use of aspirin: Secondary | ICD-10-CM | POA: Insufficient documentation

## 2013-10-20 DIAGNOSIS — E119 Type 2 diabetes mellitus without complications: Secondary | ICD-10-CM | POA: Insufficient documentation

## 2013-10-20 DIAGNOSIS — Z792 Long term (current) use of antibiotics: Secondary | ICD-10-CM | POA: Insufficient documentation

## 2013-10-20 DIAGNOSIS — T148XXA Other injury of unspecified body region, initial encounter: Secondary | ICD-10-CM | POA: Diagnosis not present

## 2013-10-20 DIAGNOSIS — S1093XA Contusion of unspecified part of neck, initial encounter: Secondary | ICD-10-CM | POA: Insufficient documentation

## 2013-10-20 DIAGNOSIS — L03119 Cellulitis of unspecified part of limb: Secondary | ICD-10-CM | POA: Insufficient documentation

## 2013-10-20 DIAGNOSIS — E1165 Type 2 diabetes mellitus with hyperglycemia: Secondary | ICD-10-CM

## 2013-10-20 DIAGNOSIS — IMO0001 Reserved for inherently not codable concepts without codable children: Secondary | ICD-10-CM | POA: Insufficient documentation

## 2013-10-20 DIAGNOSIS — I4510 Unspecified right bundle-branch block: Secondary | ICD-10-CM | POA: Insufficient documentation

## 2013-10-20 DIAGNOSIS — M79609 Pain in unspecified limb: Secondary | ICD-10-CM | POA: Diagnosis not present

## 2013-10-20 DIAGNOSIS — Z9889 Other specified postprocedural states: Secondary | ICD-10-CM | POA: Insufficient documentation

## 2013-10-20 DIAGNOSIS — Z862 Personal history of diseases of the blood and blood-forming organs and certain disorders involving the immune mechanism: Secondary | ICD-10-CM | POA: Insufficient documentation

## 2013-10-20 DIAGNOSIS — I509 Heart failure, unspecified: Secondary | ICD-10-CM

## 2013-10-20 DIAGNOSIS — Z79899 Other long term (current) drug therapy: Secondary | ICD-10-CM | POA: Insufficient documentation

## 2013-10-20 DIAGNOSIS — R000 Tachycardia, unspecified: Secondary | ICD-10-CM | POA: Insufficient documentation

## 2013-10-20 DIAGNOSIS — F3289 Other specified depressive episodes: Secondary | ICD-10-CM | POA: Insufficient documentation

## 2013-10-20 DIAGNOSIS — IMO0002 Reserved for concepts with insufficient information to code with codable children: Secondary | ICD-10-CM | POA: Insufficient documentation

## 2013-10-20 DIAGNOSIS — E039 Hypothyroidism, unspecified: Secondary | ICD-10-CM | POA: Insufficient documentation

## 2013-10-20 DIAGNOSIS — R0602 Shortness of breath: Secondary | ICD-10-CM

## 2013-10-20 DIAGNOSIS — W19XXXA Unspecified fall, initial encounter: Secondary | ICD-10-CM

## 2013-10-20 DIAGNOSIS — E669 Obesity, unspecified: Secondary | ICD-10-CM | POA: Insufficient documentation

## 2013-10-20 DIAGNOSIS — F329 Major depressive disorder, single episode, unspecified: Secondary | ICD-10-CM | POA: Insufficient documentation

## 2013-10-20 DIAGNOSIS — R0609 Other forms of dyspnea: Secondary | ICD-10-CM | POA: Insufficient documentation

## 2013-10-20 DIAGNOSIS — S0003XA Contusion of scalp, initial encounter: Secondary | ICD-10-CM | POA: Insufficient documentation

## 2013-10-20 DIAGNOSIS — I5032 Chronic diastolic (congestive) heart failure: Secondary | ICD-10-CM | POA: Insufficient documentation

## 2013-10-20 DIAGNOSIS — Z8669 Personal history of other diseases of the nervous system and sense organs: Secondary | ICD-10-CM | POA: Insufficient documentation

## 2013-10-20 DIAGNOSIS — Y92009 Unspecified place in unspecified non-institutional (private) residence as the place of occurrence of the external cause: Secondary | ICD-10-CM

## 2013-10-20 DIAGNOSIS — Z794 Long term (current) use of insulin: Secondary | ICD-10-CM | POA: Insufficient documentation

## 2013-10-20 DIAGNOSIS — Z87891 Personal history of nicotine dependence: Secondary | ICD-10-CM | POA: Insufficient documentation

## 2013-10-20 DIAGNOSIS — I428 Other cardiomyopathies: Secondary | ICD-10-CM | POA: Insufficient documentation

## 2013-10-20 DIAGNOSIS — Z9861 Coronary angioplasty status: Secondary | ICD-10-CM | POA: Insufficient documentation

## 2013-10-20 DIAGNOSIS — I5042 Chronic combined systolic (congestive) and diastolic (congestive) heart failure: Secondary | ICD-10-CM

## 2013-10-20 DIAGNOSIS — R0989 Other specified symptoms and signs involving the circulatory and respiratory systems: Secondary | ICD-10-CM | POA: Insufficient documentation

## 2013-10-20 DIAGNOSIS — S0093XA Contusion of unspecified part of head, initial encounter: Secondary | ICD-10-CM

## 2013-10-20 DIAGNOSIS — S43014A Anterior dislocation of right humerus, initial encounter: Secondary | ICD-10-CM

## 2013-10-20 DIAGNOSIS — F411 Generalized anxiety disorder: Secondary | ICD-10-CM | POA: Insufficient documentation

## 2013-10-20 DIAGNOSIS — Z8742 Personal history of other diseases of the female genital tract: Secondary | ICD-10-CM | POA: Insufficient documentation

## 2013-10-20 DIAGNOSIS — I2510 Atherosclerotic heart disease of native coronary artery without angina pectoris: Secondary | ICD-10-CM | POA: Insufficient documentation

## 2013-10-20 DIAGNOSIS — M1990 Unspecified osteoarthritis, unspecified site: Secondary | ICD-10-CM | POA: Insufficient documentation

## 2013-10-20 DIAGNOSIS — I5022 Chronic systolic (congestive) heart failure: Secondary | ICD-10-CM | POA: Insufficient documentation

## 2013-10-20 DIAGNOSIS — I252 Old myocardial infarction: Secondary | ICD-10-CM | POA: Insufficient documentation

## 2013-10-20 DIAGNOSIS — E785 Hyperlipidemia, unspecified: Secondary | ICD-10-CM | POA: Insufficient documentation

## 2013-10-20 DIAGNOSIS — Y9389 Activity, other specified: Secondary | ICD-10-CM | POA: Insufficient documentation

## 2013-10-20 LAB — CBC WITH DIFFERENTIAL/PLATELET
Basophils Absolute: 0 10*3/uL (ref 0.0–0.1)
Basophils Relative: 0 % (ref 0–1)
Eosinophils Absolute: 0 10*3/uL (ref 0.0–0.7)
Eosinophils Relative: 0 % (ref 0–5)
HCT: 32.5 % — ABNORMAL LOW (ref 36.0–46.0)
Hemoglobin: 10.6 g/dL — ABNORMAL LOW (ref 12.0–15.0)
Lymphocytes Relative: 6 % — ABNORMAL LOW (ref 12–46)
Lymphs Abs: 0.6 10*3/uL — ABNORMAL LOW (ref 0.7–4.0)
MCH: 26.3 pg (ref 26.0–34.0)
MCHC: 32.6 g/dL (ref 30.0–36.0)
MCV: 80.6 fL (ref 78.0–100.0)
Monocytes Absolute: 1 10*3/uL (ref 0.1–1.0)
Monocytes Relative: 9 % (ref 3–12)
Neutro Abs: 9.5 10*3/uL — ABNORMAL HIGH (ref 1.7–7.7)
Neutrophils Relative %: 85 % — ABNORMAL HIGH (ref 43–77)
Platelets: 275 10*3/uL (ref 150–400)
RBC: 4.03 MIL/uL (ref 3.87–5.11)
RDW: 15.9 % — ABNORMAL HIGH (ref 11.5–15.5)
WBC: 11.1 10*3/uL — ABNORMAL HIGH (ref 4.0–10.5)

## 2013-10-20 LAB — SAMPLE TO BLOOD BANK

## 2013-10-20 MED ORDER — TORSEMIDE 20 MG PO TABS: ORAL_TABLET | ORAL | Status: DC

## 2013-10-20 MED ORDER — POTASSIUM CHLORIDE ER 20 MEQ PO TBCR: 40.0000 meq | EXTENDED_RELEASE_TABLET | Freq: Two times a day (BID) | ORAL | Status: DC

## 2013-10-20 MED ORDER — HYDROMORPHONE HCL PF 1 MG/ML IJ SOLN
1.0000 mg | Freq: Once | INTRAMUSCULAR | Status: AC
  Administered 2013-10-20: 1 mg via INTRAVENOUS
  Filled 2013-10-20: qty 1

## 2013-10-20 NOTE — ED Notes (Signed)
Per EMS, Patient was laying on the front porch when EMS arrived . Patient was laying supine with her right arm up in the air. Patient complained of extreme pain every time her arm was moved. Patient refused C-Spine and Backboard stabilization. Patient's pain was 10/10 upon arrival of EMS. 4 mg of Morphine given and pain was decreased to 6/10 and At times No Pain. Vitals per EMS: 104 Hr, 24 RR, 176/98 BP.

## 2013-10-20 NOTE — ED Notes (Signed)
Phlebotomy at the bedside  

## 2013-10-20 NOTE — ED Notes (Signed)
Dr. Otter at the bedside.  

## 2013-10-20 NOTE — Patient Instructions (Addendum)
Follow up next week  Take Torsemide 80 mg in am and 40 mg in pm   Take 5 mg metolazone Friday and Saturday   Take 40 meq of potassium twice a day  Do the following things EVERYDAY: 1) Weigh yourself in the morning before breakfast. Write it down and keep it in a log. 2) Take your medicines as prescribed 3) Eat low salt foods-Limit salt (sodium) to 2000 mg per day.  4) Stay as active as you can everyday 5) Limit all fluids for the day to less than 2 liters

## 2013-10-20 NOTE — ED Provider Notes (Signed)
CSN: [id number]     Arrival date & time 10/20/13  2231 History   First MD Initiated Contact with Patient 10/20/13 2300     Chief Complaint  Patient presents with  . Shoulder Injury     (Consider location/radiation/quality/duration/timing/severity/associated sxs/prior Treatment) HPI 63 year old female presents to emergency department via EMS after a fall at home.  Patient reports she tripped over a rug on her front porch, struck her head against the house, felt her knees and then awoke on her back.  Patient is complaining of severe pain to her right upper extremity from the mid upper arm to shoulder.  Patient has contusion to 4 head.  She denies any head or neck pain.  Patient has multiple medical problems to include hypertension, CHF, coronary disease, diabetes, obesity, osteoarthritis and TIA.  Patient was seen today by her cardiology clinic and thought to be in worsening heart failure and she was instructed to increase her Lasix starting tomorrow. Past Medical History  Diagnosis Date  . Tachycardia     Sinus tachycardia  . Dyslipidemia   . Hypothyroidism   . CAD (coronary artery disease)     MI in 2000 - MI  2007 - treated bare metal stent (no nuclear since then as 9/11)  . HTN (hypertension)   . Anemia   . Chronic diastolic heart failure     a) ECHO (08/2013) EF 55-60% and RV function nl b) RHC (08/2013) RA 4, RV 30/5/7, PA 25/10 (16), PCWP 7, Fick CO/CI 6.3/2.7, PVR 1.5 WU, PA 61 and 66%  . RBBB (right bundle branch block)     Old  . Renal insufficiency   . Urinary incontinence   . Obesity   . Syncope     likely due to low blood sugar  . Carotid artery disease   . Acute MI 1999; 2007  . CHF (congestive heart failure)   . Anxiety   . Depression   . Type II diabetes mellitus   . Osteoarthritis   . Arthritis   . Peripheral neuropathy   . Venous insufficiency   . Stroke     mini strokes   Past Surgical History  Procedure Laterality Date  . Knee arthroscopy Left 10/25/2006   . Coronary angioplasty with stent placement  1999; 2007    "1 + 1"  . Abdominal hysterectomy  1980's  . Tubal ligation  1970's  . Cataract extraction, bilateral Bilateral ?2013   Family History  Problem Relation Age of Onset  . Coronary artery disease    . Heart attack Mother    History  Substance Use Topics  . Smoking status: Former Smoker -- 3.00 packs/day for 32 years    Types: Cigarettes    Quit date: 05/26/1997  . Smokeless tobacco: Never Used  . Alcohol Use: No   OB History   Grav Para Term Preterm Abortions TAB SAB Ect Mult Living                 Review of Systems  See History of Present Illness; otherwise all other systems are reviewed and negative   Allergies  Codeine  Home Medications   Prior to Admission medications   Medication Sig Start Date End Date Taking? Authorizing Provider  albuterol (PROVENTIL HFA;VENTOLIN HFA) 108 (90 BASE) MCG/ACT inhaler Inhale 2 puffs into the lungs every 6 (six) hours as needed for wheezing or shortness of breath.    Historical Provider, MD  Ascorbic Acid (VITAMIN C PO) Take 1 tablet by mouth  daily.    Historical Provider, MD  aspirin EC 81 MG tablet Take 81 mg by mouth daily.    Historical Provider, MD  atorvastatin (LIPITOR) 80 MG tablet Take 80 mg by mouth daily at 6 PM.    Historical Provider, MD  carvedilol (COREG) 12.5 MG tablet Take 12.5 mg by mouth 2 (two) times daily with a meal.    Historical Provider, MD  Cholecalciferol (VITAMIN D PO) Take 1 tablet by mouth daily.    Historical Provider, MD  clonazePAM (KLONOPIN) 1 MG tablet Take 1 tablet (1 mg total) by mouth 3 (three) times daily as needed for anxiety. 08/10/13   Lonie Peak Dixon, PA-C  clopidogrel (PLAVIX) 75 MG tablet TAKE 1 TABLET BY MOUTH EVERY DAY...NEED OFFICE VISIT BEFORE ANY MORE REFILLS    Jolaine Artist, MD  Cyanocobalamin (VITAMIN B12 PO) Take 1 tablet by mouth daily.    Historical Provider, MD  fish oil-omega-3 fatty acids 1000 MG capsule Take 1 g by  mouth daily.     Historical Provider, MD  insulin NPH-regular Human (NOVOLIN 70/30) (70-30) 100 UNIT/ML injection Inject 30-60 Units into the skin 3 (three) times daily with meals. Take 60 units in am , 30 units at lunch, and 60 units in the evening    Historical Provider, MD  isosorbide dinitrate (ISORDIL) 10 MG tablet Take 10 mg by mouth 2 (two) times daily.    Historical Provider, MD  levofloxacin (LEVAQUIN) 500 MG tablet Take 500 mg by mouth daily.    Historical Provider, MD  levothyroxine (SYNTHROID, LEVOTHROID) 50 MCG tablet Take 50 mcg by mouth daily before breakfast.    Historical Provider, MD  losartan (COZAAR) 50 MG tablet Take 50 mg by mouth daily.    Historical Provider, MD  metFORMIN (GLUCOPHAGE) 500 MG tablet Take 500 mg by mouth daily.    Historical Provider, MD  metolazone (ZAROXOLYN) 5 MG tablet Take 5 mg by mouth daily as needed (fluid).    Historical Provider, MD  Multiple Vitamin (MULTIVITAMIN WITH MINERALS) TABS tablet Take 1 tablet by mouth daily.    Historical Provider, MD  nitroGLYCERIN (NITROSTAT) 0.4 MG SL tablet Place 1 tablet (0.4 mg total) under the tongue every 5 (five) minutes as needed for chest pain. 05/06/13   Carlena Bjornstad, MD  PARoxetine (PAXIL-CR) 25 MG 24 hr tablet TAKE 1 TABLET BY MOUTH EVERY DAY 10/12/13   [name], PA-C  PARoxetine (PAXIL-CR) 37.5 MG 24 hr tablet TAKE 1 TABLET BY MOUTH DAILY 09/23/13   [name], PA-C  Potassium Chloride ER 20 MEQ TBCR Take 40 mEq by mouth 2 (two) times daily. 10/20/13   Amy D Clegg, NP  predniSONE (DELTASONE) 20 MG tablet Take 20 mg by mouth daily with breakfast.    Historical Provider, MD  pregabalin (LYRICA) 150 MG capsule Take 150 mg by mouth 3 (three) times daily.    Historical Provider, MD  torsemide (DEMADEX) 20 MG tablet Take 80 mg in am and 40 mg in pm. 10/20/13   Amy D Clegg, NP  triamcinolone cream (KENALOG) 0.1 % Apply 1 application topically 2 (two) times daily.    Historical Provider, MD   BP 183/95  Pulse  101  Temp(Src) 97.8 F (36.6 C) (Oral)  Resp 24  SpO2 100% Physical Exam  Nursing note and vitals reviewed. Constitutional: She is oriented to person, place, and time. She appears well-developed and well-nourished. She appears distressed.  Patient is morbidly obese, appears uncomfortable  HENT:  Head: Normocephalic.  Right Ear: External ear normal.  Left Ear: External ear normal.  Nose: Nose normal.  Mouth/Throat: Oropharynx is clear and moist.  Abrasion and contusion to right forehead at the hairline  Eyes: Conjunctivae and EOM are normal. Pupils are equal, round, and reactive to light.  Neck: Normal range of motion. Neck supple. No JVD present. No tracheal deviation present. No thyromegaly present.  Cardiovascular: Regular rhythm, normal heart sounds and intact distal pulses.  Exam reveals no gallop and no friction rub.   No murmur heard. Tachycardia noted, hypertension noted  Pulmonary/Chest: Effort normal. No stridor. No respiratory distress. She has wheezes. She has no rales. She exhibits no tenderness.  Abdominal: Soft. Bowel sounds are normal. She exhibits no distension and no mass. There is no tenderness. There is no rebound and no guarding.  Musculoskeletal: Normal range of motion. She exhibits no edema and no tenderness.  The patient is unable to move at the right shoulder or elbow secondary to pain.  Difficult to assess for deformity given obesity.  Palpation of elbow to shoulder area on the right distally she is neurovascularly intact and has no disabilities below the elbow.  Lymphadenopathy:    She has no cervical adenopathy.  Neurological: She is alert and oriented to person, place, and time. She has normal reflexes. No cranial nerve deficit. She exhibits normal muscle tone. Coordination normal.  Skin: Skin is warm and dry. No rash noted. No erythema. No pallor.  Psychiatric: She has a normal mood and affect. Her behavior is normal. Judgment and thought content normal.     ED Course  Reduction of dislocation Date/Time: 10/21/2013 1:35 AM Performed by: Kalman Drape Authorized by: Kalman Drape Consent: Verbal consent obtained. Risks and benefits: risks, benefits and alternatives were discussed Consent given by: patient Patient understanding: patient states understanding of the procedure being performed Patient consent: the patient's understanding of the procedure matches consent given Procedure consent: procedure consent matches procedure scheduled Relevant documents: relevant documents present and verified Test results: test results available and properly labeled Site marked: the operative site was marked Imaging studies: imaging studies available Required items: required blood products, implants, devices, and special equipment available Patient identity confirmed: verbally with patient Time out: Immediately prior to procedure a "time out" was called to verify the correct patient, procedure, equipment, support staff and site/side marked as required. Local anesthesia used: no Patient sedated: yes Sedation type: moderate (conscious) sedation Sedatives: etomidate Sedation start date/time: 10/21/2013 12:57 AM Sedation end date/time: 10/21/2013 1:08 AM Vitals: Vital signs were monitored during sedation. Patient tolerance: Patient tolerated the procedure well with no immediate complications. Comments: Using abduction and extension, shoulder was reduced with moderate difficulty.  Pt neurovascularly intact post reduction, reports resolution of pain.    (including critical care time) Labs Review Labs Reviewed  CBC WITH DIFFERENTIAL - Abnormal; Notable for the following:    WBC 11.1 (*)    Hemoglobin 10.6 (*)    HCT 32.5 (*)    RDW 15.9 (*)    Neutrophils Relative % 85 (*)    Neutro Abs 9.5 (*)    Lymphocytes Relative 6 (*)    Lymphs Abs 0.6 (*)    All other components within normal limits  BASIC METABOLIC PANEL - Abnormal; Notable for the following:     Sodium 133 (*)    Glucose, Bld 403 (*)    BUN 35 (*)    Creatinine, Ser 1.23 (*)    GFR calc non Af  Amer 45 (*)    GFR calc Af Amer 53 (*)    All other components within normal limits  PRO B NATRIURETIC PEPTIDE - Abnormal; Notable for the following:    Pro B Natriuretic peptide (BNP) 6326.0 (*)    All other components within normal limits  SAMPLE TO BLOOD BANK    Imaging Review Dg Chest 1 View  10/21/2013   CLINICAL DATA:  Shoulder injury  EXAM: CHEST - 1 VIEW  COMPARISON:  Prior radiograph from 09/21/2013  FINDINGS: Cardiomegaly is stable as compared to prior exam.  The lungs are normally inflated. Hazy and patchy opacity within the right lung base may reflect atelectasis or possibly infiltrate. No pulmonary edema. No definite pleural effusion.  There is anterior/inferior dislocation of the right humeral head, incompletely evaluated.  IMPRESSION: 1. Anterior/inferior right shoulder dislocation. 2. Hazy and patchy opacity within the right lung base. Findings likely reflect atelectasis, although possible infiltrate or aspiration could be considered in the correct clinical setting.   Electronically Signed   By: Jeannine Boga M.D.   On: 10/21/2013 00:20   Dg Shoulder Right  10/21/2013   CLINICAL DATA:  Status post reduction of right shoulder dislocation.  EXAM: RIGHT SHOULDER - 2+ VIEW  COMPARISON:  Right humerus radiographs performed 10/20/2013  FINDINGS: Evaluation is mildly suboptimal due to limitations in positioning. There has been successful reduction of the right humeral head dislocation. The somewhat superior positioning of the humeral head raises question for underlying chronic rotator cuff injury. No definite Hill-Sachs or osseous Bankart lesion is identified, though this is difficult to fully assess on provided images. Minimal degenerative change is noted at the right acromioclavicular joint.  There is suggestion of overlying soft tissue swelling. The visualized portions of the  right lung are clear.  IMPRESSION: Successful reduction of right humeral head dislocation. The humeral head is somewhat superiorly positioned, raising question for underlying chronic rotator cuff injury. No definite evidence of fracture.   Electronically Signed   By: Garald Balding M.D.   On: 10/21/2013 02:42   Dg Forearm Right  10/21/2013   CLINICAL DATA:  RIGHT shoulder and wrist pain post fall  EXAM: RIGHT FOREARM - 2 VIEW  COMPARISON:  None  FINDINGS: Mild dorsal soft tissue swelling.  Bones appear mildly demineralized.  Wrist and elbow joint alignments normal.  No acute fracture, dislocation, or bone destruction.  IMPRESSION: No acute osseous abnormalities.   Electronically Signed   By: Lavonia Dana M.D.   On: 10/21/2013 00:18   Ct Head Wo Contrast  10/21/2013   CLINICAL DATA:  Fall, contusion, question loss of consciousness, found on porch  EXAM: CT HEAD WITHOUT CONTRAST  TECHNIQUE: Contiguous axial images were obtained from the base of the skull through the vertex without intravenous contrast.  COMPARISON:  04/26/2012  FINDINGS: Beam hardening artifacts of dental origin.  Generalized atrophy.  Normal ventricular morphology.  No midline shift or mass effect.  Small vessel chronic ischemic changes of deep cerebral white matter.  No intracranial hemorrhage, mass lesion, or evidence acute infarction.  No extra-axial fluid collections.  RIGHT frontoparietal scalp hematoma extending into RIGHT temporal region.  Atherosclerotic calcifications at skullbase.  Calvaria intact.  IMPRESSION: No acute intracranial abnormalities.  Atrophy with small vessel chronic ischemic changes of deep cerebral white matter.  Large RIGHT side scalp hematoma.   Electronically Signed   By: Lavonia Dana M.D.   On: 10/21/2013 00:54   Ct Cervical Spine Wo Contrast  10/21/2013  CLINICAL DATA:  Status post right shoulder injury; neck pain.  EXAM: CT CERVICAL SPINE WITHOUT CONTRAST  TECHNIQUE: Multidetector CT imaging of the cervical  spine was performed without intravenous contrast. Multiplanar CT image reconstructions were also generated.  COMPARISON:  None.  FINDINGS: There is no evidence of fracture or subluxation. Vertebral bodies demonstrate normal height and alignment. Multilevel disc space narrowing and scattered anterior and posterior disc osteophyte complexes are seen along the lower cervical spine. Prevertebral soft tissues are grossly unremarkable; apparent thickening reflects retropharyngeal common carotid arteries and posterior extension of the right thyroid lobe. Mild right-sided facet disease is noted.  The thyroid gland is unremarkable in appearance, aside from minimal calcification at the right thyroid lobe. Minimal atelectasis is noted at the lung apices. No significant soft tissue abnormalities are seen.  IMPRESSION: 1. No evidence of acute fracture or subluxation along the cervical spine. 2. Mild degenerative change noted along the lower cervical spine. 3. Minimal atelectasis at the lung apices.   Electronically Signed   By: Garald Balding M.D.   On: 10/21/2013 02:46   Dg Humerus Right  10/21/2013   CLINICAL DATA:  Fall  EXAM: RIGHT HUMERUS - 2+ VIEW  COMPARISON:  None.  FINDINGS: There is anterior inferior dislocation of the right humeral head relative to the glenoid.  The humerus is intact without evidence of acute fracture. Limited views of the elbow are unremarkable. No soft tissue abnormality.  IMPRESSION: 1. Anterior inferior right shoulder dislocation. 2. No acute fracture within the right humerus.   Electronically Signed   By: Jeannine Boga M.D.   On: 10/21/2013 00:22     EKG Interpretation None      MDM   Final diagnoses:  Fall at home  Anterior dislocation of right shoulder  Head contusion  CHF (congestive heart failure)     63 yo female s/p fall, possible LOC, severe right arm pain.  Difficult exam due to pain, obesity.  Plan for CT scan head, cspine, right upper extremity xrays-concern  for humerus fracture, shoulder dislocation.  Pt with CHF, will check labs, chest xray.    Kalman Drape, MD 10/21/13 [phone]

## 2013-10-20 NOTE — Progress Notes (Signed)
Patient ID: Susan [name], female   DOB: Aug 23, 1950, 63 y.o.   MRN: [id number]  PCP: Dr Edilia Bo  Cardiologist: Dr Myrtis Ser  Endocrinologist: Dr Lurene Shadow   HPI: Susan [name] is a 63 year old with a history of RBBB, DM, Hypothyroid, diastolic heart failure, CAD MI 2000/2007 BMS 2007, TIA, obesity.   She returns for follow up. Since the last visit diabetes regimen has been adjusted. Glucose better controlled < 200. Yesterday she was evaluated at Urgent Care for increased dyspnea. Started on steroids and antibiotics. SOB with exertion. + Orthopnea. She has not been weight at home. Eating high salt foods such as sausage. Weight at home trending up 310 pounds. She is taking metolazone once a week. Drinking> 2 liters. Eating high salt foods. Followed by paramedicine.   RHC 09/22/13: RA = 4  RV = 30/5/7  PA = 25/10 (16)  PCW = 7  Fick cardiac output/index = 6.3/2.7  PVR = 1.5 WU  FA sat = 93%  PA sat = 61%, 66%  Labs: 07/18/13 K 4.8 Creatinine 1.38 Glucose 1005 she refused to go to [facility] Labs : 08/17/13 K 3.9 Creatinine 1.4 Glucose 499 Labs: 09/29/13 K 4.5 Creatinine 1.4   SH: Former smoker quit 15 year ago. Does drink alcohol. She is disabled. Lives at home with her husband and disabled son - Husband is truck [employer] doctor. Son has a brain injury after he attempted suicide 2 years ago.    FH: Mom MI  ROS: All systems negative except as listed in HPI, PMH and Problem List.  Past Medical History  Diagnosis Date  . Tachycardia     Sinus tachycardia  . Dyslipidemia   . Hypothyroidism   . CAD (coronary artery disease)     MI in 2000 - MI  2007 - treated bare metal stent (no nuclear since then as 9/11)  . HTN (hypertension)   . Anemia   . Chronic diastolic heart failure     a) ECHO (08/2013) EF 55-60% and RV function nl b) RHC (08/2013) RA 4, RV 30/5/7, PA 25/10 (16), PCWP 7, Fick CO/CI 6.3/2.7, PVR 1.5 WU, PA 61 and 66%  . RBBB (right bundle branch block)     Old  . Renal insufficiency   .  Urinary incontinence   . Obesity   . Syncope     likely due to low blood sugar  . Stroke     mini strokes  . Carotid artery disease   . Acute MI 1999; 2007  . CHF (congestive heart failure)   . Anxiety   . Depression   . Type II diabetes mellitus   . Osteoarthritis   . Arthritis   . Peripheral neuropathy   . Venous insufficiency     Current Outpatient Prescriptions  Medication Sig Dispense Refill  . albuterol (PROVENTIL HFA;VENTOLIN HFA) 108 (90 BASE) MCG/ACT inhaler Inhale 2 puffs into the lungs every 6 (six) hours as needed for wheezing or shortness of breath.      . Ascorbic Acid (VITAMIN C PO) Take 1 tablet by mouth daily.      Marland Kitchen aspirin EC 81 MG tablet Take 81 mg by mouth daily.      Marland Kitchen atorvastatin (LIPITOR) 80 MG tablet Take 80 mg by mouth daily at 6 PM.      . carvedilol (COREG) 12.5 MG tablet Take 12.5 mg by mouth 2 (two) times daily with a meal.      . Cholecalciferol (VITAMIN D PO) Take 1 tablet  by mouth daily.      . clonazePAM (KLONOPIN) 1 MG tablet Take 1 tablet (1 mg total) by mouth 3 (three) times daily as needed for anxiety.  90 tablet  0  . clopidogrel (PLAVIX) 75 MG tablet TAKE 1 TABLET BY MOUTH EVERY DAY...NEED OFFICE VISIT BEFORE ANY MORE REFILLS  90 tablet  0  . Cyanocobalamin (VITAMIN B12 PO) Take 1 tablet by mouth daily.      . fish oil-omega-3 fatty acids 1000 MG capsule Take 1 g by mouth daily.       . insulin NPH-regular Human (NOVOLIN 70/30) (70-30) 100 UNIT/ML injection Inject 30-60 Units into the skin 3 (three) times daily with meals. Take 60 units in am , 30 units at lunch, and 60 units in the evening      . isosorbide dinitrate (ISORDIL) 10 MG tablet Take 10 mg by mouth 2 (two) times daily.      Marland Kitchen levofloxacin (LEVAQUIN) 500 MG tablet Take 500 mg by mouth daily.      Marland Kitchen levothyroxine (SYNTHROID, LEVOTHROID) 50 MCG tablet Take 50 mcg by mouth daily before breakfast.      . losartan (COZAAR) 50 MG tablet Take 50 mg by mouth daily.      . metFORMIN  (GLUCOPHAGE) 500 MG tablet Take 500 mg by mouth daily.      . metolazone (ZAROXOLYN) 5 MG tablet Take 5 mg by mouth daily as needed (fluid).      . Multiple Vitamin (MULTIVITAMIN WITH MINERALS) TABS tablet Take 1 tablet by mouth daily.      . nitroGLYCERIN (NITROSTAT) 0.4 MG SL tablet Place 1 tablet (0.4 mg total) under the tongue every 5 (five) minutes as needed for chest pain.  25 tablet  3  . PARoxetine (PAXIL-CR) 25 MG 24 hr tablet TAKE 1 TABLET BY MOUTH EVERY DAY  30 tablet  5  . PARoxetine (PAXIL-CR) 37.5 MG 24 hr tablet TAKE 1 TABLET BY MOUTH DAILY  30 tablet  3  . Potassium Chloride ER 20 MEQ TBCR Take 40 mEq by mouth daily.  60 tablet  6  . predniSONE (DELTASONE) 20 MG tablet Take 20 mg by mouth daily with breakfast.      . pregabalin (LYRICA) 150 MG capsule Take 150 mg by mouth 3 (three) times daily.      Marland Kitchen torsemide (DEMADEX) 20 MG tablet Take 4 tablets (80 mg total) by mouth daily.  120 tablet  6  . triamcinolone cream (KENALOG) 0.1 % Apply 1 application topically 2 (two) times daily.       No current facility-administered medications for this encounter.    Filed Vitals:   10/20/13 1457  BP: 176/73  Pulse: 103  Resp: 24  Weight: 304 lb 7 oz (138.092 kg)  SpO2: 95%   PHYSICAL EXAM: General:  NAD;  HEENT: normal Neck: supple. JVP difficult to assess d/t body habitus but does appear elevated.  Carotids 2+ bilaterally; no bruits. No lymphadenopathy or thryomegaly appreciated. Cor: PMI normal. Regular rate & rhythm. No rubs, gallops or murmurs. Lungs: clear Abdomen: obese, soft, nontender, nondistended. No hepatosplenomegaly. No bruits or masses. Good bowel sounds. Extremities: no cyanosis, clubbing, rash, R and LLE 2-3+ edema with erythema noted LLE with blisters noted on posterior aspect. Neuro: alert & orientedx3, cranial nerves grossly intact. Moves all 4 extremities w/o difficulty. Affect pleasant.  ASSESSMENT & PLAN:   1. Chronic Systolic Heart Failure: NICM, EF  55-60% (08/2013)  -- NYHA III symptoms and volume  status elevated likely due to dietary and fluid noncompliance. . Weight up 19 pounds since last visit. Will continue torsemide 80 mg daily and add 40 mg in afternoon. Increase potassium to 40 meq twice a day.  - Continue coreg 12.5 mg BID and losartan 50 mg daily. - Reinforced the need and importance of daily weights, a low sodium diet, and fluid restriction (less than 2 L a day). Instructed to call the HF clinic if weight increases more than 3 lbs overnight or 5 lbs in a week 2.. Dyspnea- Ongoing dyspnea with exertion. Needs sleep study. Refer back to pulmonary she reluctant due to cost.  3. Uncontrolled DM: Blood sugars at home better controlled. nsulin adjusted per Dr Lurene Shadow.  4. CAD- no evidence of ischemia- on aspirin and statin.  5. Obesity: Discussed portion control the importance of weight loss. Dr Lurene Shadow is referring her to dietitian.  6. LLE cellulitis. On Levaquin per Urgent Care.  7. HTN- elevated. Continue current regimen and increase diuretic regimen  Follow up next week to reassess volume status and check BMET Charice Zuno D Brodie Scovell NP-C 3:19 PM

## 2013-10-21 ENCOUNTER — Encounter (HOSPITAL_COMMUNITY): Payer: Self-pay | Admitting: *Deleted

## 2013-10-21 ENCOUNTER — Emergency Department (HOSPITAL_COMMUNITY): Payer: Commercial Managed Care - PPO

## 2013-10-21 ENCOUNTER — Telehealth (HOSPITAL_COMMUNITY): Payer: Self-pay

## 2013-10-21 LAB — BASIC METABOLIC PANEL
BUN: 35 mg/dL — ABNORMAL HIGH (ref 6–23)
CO2: 25 mEq/L (ref 19–32)
Calcium: 9.1 mg/dL (ref 8.4–10.5)
Chloride: 96 mEq/L (ref 96–112)
Creatinine, Ser: 1.23 mg/dL — ABNORMAL HIGH (ref 0.50–1.10)
GFR calc Af Amer: 53 mL/min — ABNORMAL LOW (ref >90)
GFR calc non Af Amer: 45 mL/min — ABNORMAL LOW (ref >90)
Glucose, Bld: 403 mg/dL — ABNORMAL HIGH (ref 70–99)
Potassium: 4.8 mEq/L (ref 3.7–5.3)
Sodium: 133 mEq/L — ABNORMAL LOW (ref 137–147)

## 2013-10-21 LAB — PRO B NATRIURETIC PEPTIDE: Pro B Natriuretic peptide (BNP): 6326 pg/mL — ABNORMAL HIGH (ref 0–125)

## 2013-10-21 MED ORDER — OXYCODONE-ACETAMINOPHEN 5-325 MG PO TABS: 1.0000 | ORAL_TABLET | Freq: Four times a day (QID) | ORAL | Status: DC | PRN

## 2013-10-21 MED ORDER — ETOMIDATE 2 MG/ML IV SOLN
10.0000 mg | Freq: Once | INTRAVENOUS | Status: AC
Start: 2013-10-21 — End: 2013-10-21
  Administered 2013-10-21: 10 mg via INTRAVENOUS
  Filled 2013-10-21: qty 10

## 2013-10-21 NOTE — Progress Notes (Signed)
Orthopedic Tech Progress Note Patient Details:  Susan [name] 12/18/1950 [id number]  Ortho Devices Type of Ortho Device: Sling immobilizer   [name] 10/21/2013, 12:45 AM

## 2013-10-21 NOTE — Telephone Encounter (Signed)
Patient called stating her weight has not decreased since appointment and diuretic increase yesterday.  Also stated she fell and dislocated shoulder and had it reset at our ED and is now in a sling, on pain medication, and unable to make her appointment on Monday.  Patient states she may be able to come in some time next week if she feels better or husband can take time off work to drive her.  Instructed to keep taking medications as prescribed yesterday during visit and to call us as soon as she knows when she will be available to make an appointment with [name] next week.  Instructed if weight continues to increase, she has extreme SOB or increased WOB, or other serious s/s to seek emergency medical attention.  Patient aware and agreeable.

## 2013-10-21 NOTE — Discharge Instructions (Signed)
Please contact the orthopedist office this morning to set up an appointment in about a week.  Keep sling in place until seen by orthopedics.  Remove the rug from your front porch!  Follow the instructions given to you earlier today from the cardiology office.  Return to the ER for worsening condition or new concerning symptoms.   Contusion A contusion is a deep bruise. Contusions are the result of an injury that caused bleeding under the skin. The contusion may turn blue, purple, or yellow. Minor injuries will give you a painless contusion, but more severe contusions may stay painful and swollen for a few weeks.  CAUSES  A contusion is usually caused by a blow, trauma, or direct force to an area of the body. SYMPTOMS   Swelling and redness of the injured area.  Bruising of the injured area.  Tenderness and soreness of the injured area.  Pain. DIAGNOSIS  The diagnosis can be made by taking a history and physical exam. An X-ray, CT scan, or MRI may be needed to determine if there were any associated injuries, such as fractures. TREATMENT  Specific treatment will depend on what area of the body was injured. In general, the best treatment for a contusion is resting, icing, elevating, and applying cold compresses to the injured area. Over-the-counter medicines may also be recommended for pain control. Ask your caregiver what the best treatment is for your contusion. HOME CARE INSTRUCTIONS   Put ice on the injured area.  Put ice in a plastic bag.  Place a towel between your skin and the bag.  Leave the ice on for 15-20 minutes, 03-04 times a day.  Only take over-the-counter or prescription medicines for pain, discomfort, or fever as directed by your caregiver. Your caregiver may recommend avoiding anti-inflammatory medicines (aspirin, ibuprofen, and naproxen) for 48 hours because these medicines may increase bruising.  Rest the injured area.  If possible, elevate the injured area to  reduce swelling. SEEK IMMEDIATE MEDICAL CARE IF:   You have increased bruising or swelling.  You have pain that is getting worse.  Your swelling or pain is not relieved with medicines. MAKE SURE YOU:   Understand these instructions.  Will watch your condition.  Will get help right away if you are not doing well or get worse. Document Released: 02/19/2005 Document Revised: 08/04/2011 Document Reviewed: 03/17/2011 [facility] Patient Information 2014 [place], [place].  Fall Prevention and Home Safety Falls cause injuries and can affect all age groups. It is possible to use preventive measures to significantly decrease the likelihood of falls. There are many simple measures which can make your home safer and prevent falls. OUTDOORS  Repair cracks and edges of walkways and driveways.  Remove high doorway thresholds.  Trim shrubbery on the main path into your home.  Have good outside lighting.  Clear walkways of tools, rocks, debris, and clutter.  Check that handrails are not broken and are securely fastened. Both sides of steps should have handrails.  Have leaves, snow, and ice cleared regularly.  Use sand or salt on walkways during winter months.  In the garage, clean up grease or oil spills. BATHROOM  Install night lights.  Install grab bars by the toilet and in the tub and shower.  Use non-skid mats or decals in the tub or shower.  Place a plastic non-slip stool in the shower to sit on, if needed.  Keep floors dry and clean up all water on the floor immediately.  Remove soap buildup in  the tub or shower on a regular basis.  Secure bath mats with non-slip, double-sided rug tape.  Remove throw rugs and tripping hazards from the floors. BEDROOMS  Install night lights.  Make sure a bedside light is easy to reach.  Do not use oversized bedding.  Keep a telephone by your bedside.  Have a firm chair with side arms to use for getting dressed.  Remove throw rugs  and tripping hazards from the floor. KITCHEN  Keep handles on pots and pans turned toward the center of the stove. Use back burners when possible.  Clean up spills quickly and allow time for drying.  Avoid walking on wet floors.  Avoid hot utensils and knives.  Position shelves so they are not too high or low.  Place commonly used objects within easy reach.  If necessary, use a sturdy step stool with a grab bar when reaching.  Keep electrical cables out of the way.  Do not use floor polish or wax that makes floors slippery. If you must use wax, use non-skid floor wax.  Remove throw rugs and tripping hazards from the floor. STAIRWAYS  Never leave objects on stairs.  Place handrails on both sides of stairways and use them. Fix any loose handrails. Make sure handrails on both sides of the stairways are as long as the stairs.  Check carpeting to make sure it is firmly attached along stairs. Make repairs to worn or loose carpet promptly.  Avoid placing throw rugs at the top or bottom of stairways, or properly secure the rug with carpet tape to prevent slippage. Get rid of throw rugs, if possible.  Have an electrician put in a light switch at the top and bottom of the stairs. OTHER FALL PREVENTION TIPS  Wear low-heel or rubber-soled shoes that are supportive and fit well. Wear closed toe shoes.  When using a stepladder, make sure it is fully opened and both spreaders are firmly locked. Do not climb a closed stepladder.  Add color or contrast paint or tape to grab bars and handrails in your home. Place contrasting color strips on first and last steps.  Learn and use mobility aids as needed. Install an electrical emergency response system.  Turn on lights to avoid dark areas. Replace light bulbs that burn out immediately. Get light switches that glow.  Arrange furniture to create clear pathways. Keep furniture in the same place.  Firmly attach carpet with non-skid or  double-sided tape.  Eliminate uneven floor surfaces.  Select a carpet pattern that does not visually hide the edge of steps.  Be aware of all pets. OTHER HOME SAFETY TIPS  Set the water temperature for 120 F (48.8 C).  Keep emergency numbers on or near the telephone.  Keep smoke detectors on every level of the home and near sleeping areas. Document Released: 05/02/2002 Document Revised: 11/11/2011 Document Reviewed: 08/01/2011 [facility] Patient Information 2014 [place].  Shoulder Dislocation Your shoulder is made up of three bones: the collar bone (clavicle); the shoulder blade (scapula), which includes the socket (glenoid cavity); and the upper arm bone (humerus). Your shoulder joint is the place where these bones meet. Strong, fibrous tissues hold these bones together (ligaments). Muscles and strong, fibrous tissues that connect the muscles to these bones (tendons) allow your arm to move through this joint. The range of motion of your shoulder joint is more extensive than most of your other joints, and the glenoid cavity is very shallow. That is the reason that your shoulder  joint is one of the most unstable joints in your body. It is far more prone to dislocation than your other joints. Shoulder dislocation is when your humerus is forced out of your shoulder joint. CAUSES Shoulder dislocation is caused by a forceful impact on your shoulder. This impact usually is from an injury, such as a sports injury or a fall. SYMPTOMS Symptoms of shoulder dislocation include:  Deformity of your shoulder.  Intense pain.  Inability to move your shoulder joint.  Numbness, weakness, or tingling around your shoulder joint (your neck or down your arm).  Bruising or swelling around your shoulder. DIAGNOSIS In order to diagnose a dislocated shoulder, your caregiver will perform a physical exam. Your caregiver also may have an X-ray exam done to see if you have any broken bones. Magnetic  resonance imaging (MRI) is a procedure that sometimes is done to help your caregiver see any damage to the soft tissues around your shoulder, particularly your rotator cuff tendons. Additionally, your caregiver also may have electromyography done to measure the electrical discharges produced in your muscles if you have signs or symptoms of nerve damage. TREATMENT A shoulder dislocation is treated by placing the humerus back in the joint (reduction). Your caregiver does this either manually (closed reduction), by moving your humerus back into the joint through manipulation, or through surgery (open reduction). When your humerus is back in place, severe pain should improve almost immediately. You also may need to have surgery if you have a weak shoulder joint or ligaments, and you have recurring shoulder dislocations, despite rehabilitation. In rare cases, surgery is necessary if your nerves or blood vessels are damaged during the dislocation. After your reduction, your arm will be placed in a shoulder immobilizer or sling to keep it from moving. Your caregiver will have you wear your shoulder immoblizer or sling for 3 days to 3 weeks, depending on how serious your dislocation is. When your shoulder immobilizer or sling is removed, your caregiver may prescribe physical therapy to help improve the range of motion in your shoulder joint. HOME CARE INSTRUCTIONS  The following measures can help to reduce pain and speed up the healing process:  Rest your injured joint. Do not move it. Avoid activities similar to the one that caused your injury.  Apply ice to your injured joint for the first day or two after your reduction or as directed by your caregiver. Applying ice helps to reduce inflammation and pain.  Put ice in a plastic bag.  Place a towel between your skin and the bag.  Leave the ice on for 15-20 minutes at a time, every 2 hours while you are awake.  Exercise your hand by squeezing a soft ball.  This helps to eliminate stiffness and swelling in your hand and wrist.  Take over-the-counter or prescription medicine for pain or discomfort as told by your caregiver. SEEK IMMEDIATE MEDICAL CARE IF:   Your shoulder immobilizer or sling becomes damaged.  Your pain becomes worse rather than better.  You lose feeling in your arm or hand, or they become white and cold. MAKE SURE YOU:   Understand these instructions.  Will watch your condition.  Will get help right away if you are not doing well or get worse. Document Released: 02/04/2001 Document Revised: 08/04/2011 Document Reviewed: 03/02/2011 [facility] Patient Information 2014 [place], [place].

## 2013-10-24 ENCOUNTER — Encounter (HOSPITAL_COMMUNITY): Payer: Medicare Other

## 2013-10-28 ENCOUNTER — Encounter (HOSPITAL_COMMUNITY): Payer: Self-pay | Admitting: Emergency Medicine

## 2013-10-28 ENCOUNTER — Encounter (HOSPITAL_COMMUNITY): Payer: Self-pay

## 2013-10-28 ENCOUNTER — Emergency Department (HOSPITAL_COMMUNITY)
Admission: EM | Admit: 2013-10-28 | Discharge: 2013-10-28 | Disposition: A | Discharge status: Home / Self Care | Payer: Commercial Managed Care - PPO | Attending: Emergency Medicine | Admitting: Emergency Medicine

## 2013-10-28 ENCOUNTER — Emergency Department (HOSPITAL_COMMUNITY): Payer: Commercial Managed Care - PPO

## 2013-10-28 ENCOUNTER — Ambulatory Visit (HOSPITAL_BASED_OUTPATIENT_CLINIC_OR_DEPARTMENT_OTHER)
Admit: 2013-10-28 | Discharge: 2013-10-28 | Disposition: A | Discharge status: Home / Self Care | Payer: Commercial Managed Care - PPO | Attending: Internal Medicine | Admitting: Internal Medicine

## 2013-10-28 VITALS — BP 124/86 | HR 84 | Resp 20 | Wt 284.5 lb

## 2013-10-28 DIAGNOSIS — S0003XA Contusion of scalp, initial encounter: Secondary | ICD-10-CM | POA: Insufficient documentation

## 2013-10-28 DIAGNOSIS — S0510XA Contusion of eyeball and orbital tissues, unspecified eye, initial encounter: Secondary | ICD-10-CM | POA: Insufficient documentation

## 2013-10-28 DIAGNOSIS — S0083XA Contusion of other part of head, initial encounter: Secondary | ICD-10-CM | POA: Insufficient documentation

## 2013-10-28 DIAGNOSIS — Z87891 Personal history of nicotine dependence: Secondary | ICD-10-CM | POA: Insufficient documentation

## 2013-10-28 DIAGNOSIS — Y929 Unspecified place or not applicable: Secondary | ICD-10-CM | POA: Insufficient documentation

## 2013-10-28 DIAGNOSIS — Y939 Activity, unspecified: Secondary | ICD-10-CM | POA: Insufficient documentation

## 2013-10-28 DIAGNOSIS — Z7982 Long term (current) use of aspirin: Secondary | ICD-10-CM | POA: Insufficient documentation

## 2013-10-28 DIAGNOSIS — S43016A Anterior dislocation of unspecified humerus, initial encounter: Secondary | ICD-10-CM | POA: Diagnosis not present

## 2013-10-28 DIAGNOSIS — I10 Essential (primary) hypertension: Secondary | ICD-10-CM | POA: Insufficient documentation

## 2013-10-28 DIAGNOSIS — IMO0002 Reserved for concepts with insufficient information to code with codable children: Secondary | ICD-10-CM | POA: Insufficient documentation

## 2013-10-28 DIAGNOSIS — F3289 Other specified depressive episodes: Secondary | ICD-10-CM | POA: Insufficient documentation

## 2013-10-28 DIAGNOSIS — S1093XA Contusion of unspecified part of neck, initial encounter: Secondary | ICD-10-CM | POA: Insufficient documentation

## 2013-10-28 DIAGNOSIS — Z794 Long term (current) use of insulin: Secondary | ICD-10-CM | POA: Insufficient documentation

## 2013-10-28 DIAGNOSIS — I2510 Atherosclerotic heart disease of native coronary artery without angina pectoris: Secondary | ICD-10-CM

## 2013-10-28 DIAGNOSIS — I5032 Chronic diastolic (congestive) heart failure: Secondary | ICD-10-CM

## 2013-10-28 DIAGNOSIS — Z862 Personal history of diseases of the blood and blood-forming organs and certain disorders involving the immune mechanism: Secondary | ICD-10-CM | POA: Insufficient documentation

## 2013-10-28 DIAGNOSIS — R296 Repeated falls: Secondary | ICD-10-CM | POA: Insufficient documentation

## 2013-10-28 DIAGNOSIS — I5042 Chronic combined systolic (congestive) and diastolic (congestive) heart failure: Secondary | ICD-10-CM | POA: Diagnosis not present

## 2013-10-28 DIAGNOSIS — Z9861 Coronary angioplasty status: Secondary | ICD-10-CM | POA: Insufficient documentation

## 2013-10-28 DIAGNOSIS — E669 Obesity, unspecified: Secondary | ICD-10-CM

## 2013-10-28 DIAGNOSIS — F329 Major depressive disorder, single episode, unspecified: Secondary | ICD-10-CM | POA: Insufficient documentation

## 2013-10-28 DIAGNOSIS — Z792 Long term (current) use of antibiotics: Secondary | ICD-10-CM | POA: Insufficient documentation

## 2013-10-28 DIAGNOSIS — I509 Heart failure, unspecified: Secondary | ICD-10-CM | POA: Diagnosis not present

## 2013-10-28 DIAGNOSIS — M24419 Recurrent dislocation, unspecified shoulder: Secondary | ICD-10-CM

## 2013-10-28 DIAGNOSIS — I252 Old myocardial infarction: Secondary | ICD-10-CM | POA: Insufficient documentation

## 2013-10-28 DIAGNOSIS — Z8673 Personal history of transient ischemic attack (TIA), and cerebral infarction without residual deficits: Secondary | ICD-10-CM | POA: Insufficient documentation

## 2013-10-28 DIAGNOSIS — E119 Type 2 diabetes mellitus without complications: Secondary | ICD-10-CM | POA: Insufficient documentation

## 2013-10-28 DIAGNOSIS — Z7902 Long term (current) use of antithrombotics/antiplatelets: Secondary | ICD-10-CM | POA: Insufficient documentation

## 2013-10-28 DIAGNOSIS — E785 Hyperlipidemia, unspecified: Secondary | ICD-10-CM | POA: Insufficient documentation

## 2013-10-28 DIAGNOSIS — S43086A Other dislocation of unspecified shoulder joint, initial encounter: Secondary | ICD-10-CM | POA: Insufficient documentation

## 2013-10-28 DIAGNOSIS — E039 Hypothyroidism, unspecified: Secondary | ICD-10-CM | POA: Insufficient documentation

## 2013-10-28 DIAGNOSIS — Z79899 Other long term (current) drug therapy: Secondary | ICD-10-CM | POA: Insufficient documentation

## 2013-10-28 DIAGNOSIS — F411 Generalized anxiety disorder: Secondary | ICD-10-CM | POA: Insufficient documentation

## 2013-10-28 DIAGNOSIS — M1990 Unspecified osteoarthritis, unspecified site: Secondary | ICD-10-CM | POA: Insufficient documentation

## 2013-10-28 LAB — BASIC METABOLIC PANEL
BUN: 42 mg/dL — ABNORMAL HIGH (ref 6–23)
CO2: 28 mEq/L (ref 19–32)
Calcium: 9 mg/dL (ref 8.4–10.5)
Chloride: 94 mEq/L — ABNORMAL LOW (ref 96–112)
Creatinine, Ser: 1.54 mg/dL — ABNORMAL HIGH (ref 0.50–1.10)
GFR calc Af Amer: 40 mL/min — ABNORMAL LOW (ref >90)
GFR calc non Af Amer: 35 mL/min — ABNORMAL LOW (ref >90)
Glucose, Bld: 362 mg/dL — ABNORMAL HIGH (ref 70–99)
Potassium: 4.5 mEq/L (ref 3.7–5.3)
Sodium: 135 mEq/L — ABNORMAL LOW (ref 137–147)

## 2013-10-28 LAB — PRO B NATRIURETIC PEPTIDE: Pro B Natriuretic peptide (BNP): 1146 pg/mL — ABNORMAL HIGH (ref 0–125)

## 2013-10-28 MED ORDER — ONDANSETRON HCL 4 MG/2ML IJ SOLN
4.0000 mg | Freq: Once | INTRAMUSCULAR | Status: AC
  Administered 2013-10-28: 4 mg via INTRAVENOUS
  Filled 2013-10-28: qty 2

## 2013-10-28 MED ORDER — MORPHINE SULFATE 4 MG/ML IJ SOLN: 4.0000 mg | Freq: Once | INTRAMUSCULAR | Status: DC

## 2013-10-28 MED ORDER — POTASSIUM CHLORIDE CRYS ER 20 MEQ PO TBCR
40.0000 meq | EXTENDED_RELEASE_TABLET | Freq: Once | ORAL | Status: AC
  Administered 2013-10-28: 40 meq via ORAL
  Filled 2013-10-28: qty 2

## 2013-10-28 MED ORDER — MORPHINE SULFATE 4 MG/ML IJ SOLN
4.0000 mg | Freq: Once | INTRAMUSCULAR | Status: AC
  Administered 2013-10-28: 4 mg via INTRAVENOUS
  Filled 2013-10-28: qty 1

## 2013-10-28 MED ORDER — ETOMIDATE 2 MG/ML IV SOLN
10.0000 mg | Freq: Once | INTRAVENOUS | Status: AC
  Administered 2013-10-28: 10 mg via INTRAVENOUS
  Filled 2013-10-28: qty 10

## 2013-10-28 MED ORDER — ETOMIDATE 2 MG/ML IV SOLN
INTRAVENOUS | Status: AC
  Administered 2013-10-28: 8 mg
  Filled 2013-10-28: qty 10

## 2013-10-28 MED ORDER — OXYCODONE-ACETAMINOPHEN 5-325 MG PO TABS: 1.0000 | ORAL_TABLET | Freq: Four times a day (QID) | ORAL | Status: DC | PRN

## 2013-10-28 MED ORDER — FUROSEMIDE 10 MG/ML IJ SOLN
80.0000 mg | Freq: Once | INTRAMUSCULAR | Status: AC
  Administered 2013-10-28: 80 mg via INTRAVENOUS
  Filled 2013-10-28: qty 8

## 2013-10-28 NOTE — Sedation Documentation (Signed)
Portable x-ray at bedside to perform post reduction x-ray.

## 2013-10-28 NOTE — Progress Notes (Signed)
Patient came in after missing her appointment on Monday for 1 week f/u.  Weight back down to 284lb 8oz.  Still slightly volume overloaded with BLEE.  4 attempts made to start PIV with no success.  IV team paged, 20g PIV started by IV team in [place] using ultrasound.  Pt tolerated well.  80 iv lasix administered.  40 meq po potassium given.  Patient's wife in room with patient, call bell in reach, will continue to monitor closely.  Total urinary output:none  Ending weight:same  PIV removed before patient left clinic appointment.  Renee Pain

## 2013-10-28 NOTE — ED Notes (Signed)
Dr. Dina Rich in to see pt, at [facility]. Husband at [facility].

## 2013-10-28 NOTE — Progress Notes (Addendum)
Patient ID: Susan [name], female   DOB: [birth year], 63 y.o.   MRN: [id number]  PCP: Dr Doren Custard ([place]) Cardiologist: Dr Ron Parker  Endocrinologist: Dr Bubba Camp   HPI: Susan [name] is a 63 year old with a history of RBBB, DM, Hypothyroid, diastolic heart failure, CAD MI 2000/2007 BMS 2007, TIA, obesity.   [place] 09/22/13: RA = 4  RV = 30/5/7  PA = 25/10 (16)  PCW = 7  Fick cardiac output/index = 6.3/2.7  PVR = 1.5 WU  FA sat = 93%  PA sat = 61%, 66%  Acute Visit for Heart Failure:  Last visit volume overloaded and increased home torsemide to 80 mg q am and 40 mg q pm. Since last visit patient seen in the ED 2 times for falling and dislocation of R shoulder. Has been set twice. She presented to ED again today for R arm pain and is being referred to orthopedics. Weight is down 20 lbs from last visit. Denies SOB, PND, or CP. Sleeping in recliner. +R shoulder pain. Taking medications as prescribed. Has not been weighing daily since she got hurt. Trying to follow a low salt diet, however husband reports no. Reports drinking less than 2L a day.   Labs: 07/18/13 K 4.8 Creatinine 1.38 Glucose 1005 she refused to go to [facility] Labs : 08/17/13 K 3.9 Creatinine 1.4 Glucose 499 Labs: 09/29/13 K 4.5 Creatinine 1.4  Labs: 10/20/13: K 4.8, creatinine 1.23, pro-BNP 6326  SH: Former smoker quit 15 year ago. Does drink alcohol. She is disabled. Lives at home with her husband and disabled son - Husband is truck Geophysicist/field seismologist. Son has a brain injury after he attempted suicide 2 years ago.     FH: Mom MI  ROS: All systems negative except as listed in HPI, PMH and Problem List.  Past Medical History  Diagnosis Date  . Tachycardia     Sinus tachycardia  . Dyslipidemia   . Hypothyroidism   . CAD (coronary artery disease)     MI in 2000 - MI  2007 - treated bare metal stent (no nuclear since then as 9/11)  . HTN (hypertension)   . Anemia   . Chronic diastolic heart failure     a) ECHO  (08/2013) EF 55-60% and RV function nl b) RHC (08/2013) RA 4, RV 30/5/7, PA 25/10 (16), PCWP 7, Fick CO/CI 6.3/2.7, PVR 1.5 WU, PA 61 and 66%  . RBBB (right bundle branch block)     Old  . Renal insufficiency   . Urinary incontinence   . Obesity   . Syncope     likely due to low blood sugar  . Carotid artery disease   . Acute MI 1999; 2007  . CHF (congestive heart failure)   . Anxiety   . Depression   . Type II diabetes mellitus   . Osteoarthritis   . Arthritis   . Peripheral neuropathy   . Venous insufficiency   . Stroke     mini strokes    Current Outpatient Prescriptions  Medication Sig Dispense Refill  . albuterol (PROVENTIL HFA;VENTOLIN HFA) 108 (90 BASE) MCG/ACT inhaler Inhale 2 puffs into the lungs every 6 (six) hours as needed for wheezing or shortness of breath.      . Ascorbic Acid (VITAMIN C PO) Take 1 tablet by mouth daily.      Marland Kitchen aspirin EC 81 MG tablet Take 81 mg by mouth daily.      Marland Kitchen atorvastatin (LIPITOR) 80 MG  tablet Take 80 mg by mouth daily at 6 PM.      . carvedilol (COREG) 12.5 MG tablet Take 12.5 mg by mouth 2 (two) times daily with a meal.      . Cholecalciferol (VITAMIN D PO) Take 1 tablet by mouth daily.      . clonazePAM (KLONOPIN) 1 MG tablet Take 1 tablet (1 mg total) by mouth 3 (three) times daily as needed for anxiety.  90 tablet  0  . clopidogrel (PLAVIX) 75 MG tablet Take 75 mg by mouth daily with breakfast.      . Cyanocobalamin (VITAMIN B12 PO) Take 1 tablet by mouth daily.      . fish oil-omega-3 fatty acids 1000 MG capsule Take 1 g by mouth daily.       . insulin NPH-regular Human (NOVOLIN 70/30) (70-30) 100 UNIT/ML injection Inject 30-60 Units into the skin 3 (three) times daily with meals. Take 60 units in am , 30 units at lunch, and 60 units in the evening      . isosorbide dinitrate (ISORDIL) 10 MG tablet Take 10 mg by mouth 2 (two) times daily.      Marland Kitchen levofloxacin (LEVAQUIN) 500 MG tablet Take 500 mg by mouth daily.      Marland Kitchen levothyroxine  (SYNTHROID, LEVOTHROID) 50 MCG tablet Take 50 mcg by mouth daily before breakfast.      . losartan (COZAAR) 50 MG tablet Take 50 mg by mouth daily.      . metFORMIN (GLUCOPHAGE) 500 MG tablet Take 500 mg by mouth daily.      . metolazone (ZAROXOLYN) 5 MG tablet Take 5 mg by mouth daily as needed (fluid).      . Multiple Vitamin (MULTIVITAMIN WITH MINERALS) TABS tablet Take 1 tablet by mouth daily.      . nitroGLYCERIN (NITROSTAT) 0.4 MG SL tablet Place 1 tablet (0.4 mg total) under the tongue every 5 (five) minutes as needed for chest pain.  25 tablet  3  . oxyCODONE-acetaminophen (PERCOCET/ROXICET) 5-325 MG per tablet Take 1-2 tablets by mouth every 6 (six) hours as needed for moderate pain or severe pain.  12 tablet  0  . oxyCODONE-acetaminophen (PERCOCET/ROXICET) 5-325 MG per tablet Take 1 tablet by mouth every 6 (six) hours as needed for moderate pain or severe pain.  10 tablet  0  . PARoxetine (PAXIL-CR) 25 MG 24 hr tablet Take 25 mg by mouth daily. Taken with 37.5 for a total = 62.5mg       . PARoxetine (PAXIL-CR) 37.5 MG 24 hr tablet Take 37.5 mg by mouth daily. Taken with 25mg  for a total = 62.5 mg      . Potassium Chloride ER 20 MEQ TBCR Take 40 mEq by mouth 2 (two) times daily.  120 tablet  6  . predniSONE (DELTASONE) 20 MG tablet Take 20 mg by mouth daily with breakfast.      . pregabalin (LYRICA) 150 MG capsule Take 150 mg by mouth 3 (three) times daily.      Marland Kitchen torsemide (DEMADEX) 20 MG tablet Take 80 mg in am and 40 mg in pm.  180 tablet  6  . triamcinolone cream (KENALOG) 0.1 % Apply 1 application topically 2 (two) times daily.       No current facility-administered medications for this encounter.    Filed Vitals:   10/28/13 0918  BP: 124/86  Pulse: 84  Resp: 20  Weight: 284 lb 8 oz (129.048 kg)  SpO2: 91%   PHYSICAL  EXAM: General: Fatigued appearing, husband present HEENT: normal, ecchymosis all over face more on R side around eye and R neck and cheek Neck: supple. JVP  difficult to assess d/t body habitus but does appear elevated ~8-9. Carotids 2+ bilaterally; no bruits. No lymphadenopathy or thryomegaly appreciated. Cor: PMI normal. Regular rate & rhythm. No rubs, gallops or murmurs. Lungs: clear Abdomen: obese, soft, nontender, nondistended. No hepatosplenomegaly. No bruits or masses. Good bowel sounds. Extremities: no cyanosis, clubbing, rash, R and LLE 2+ edema with erythema  Neuro: alert & orientedx3, cranial nerves grossly intact. Moves all 4 extremities w/o difficulty. Affect pleasant.  ASSESSMENT & PLAN:   1. Chronic Diastolic Heart Failure: NICM, EF 55-60% (08/2013)  - NYHA II-III symptoms and volume status remains elevated likely related to dietary non-compliance. Weight is down 20 lbs since last visit, however still up. Will give 80 mg IV lasix with 2.5 mg metolazone and 40 meq of potassium. - Continue current medications will not titrate.  - Reinforced the need and importance of daily weights, a low sodium diet, and fluid restriction (less than 2 L a day). Instructed to call the HF clinic if weight increases more than 3 lbs overnight or 5 lbs in a week 2.. Dyspnea-  Much improved however still SOB. Needs sleep study. Will need to refer to pulmonary once shoulder issues sorted out.  3. Uncontrolled DM: Blood sugars at home better controlled. Followed by PCP.  4. CAD- no s/s of ischemia. Continue medical management.  5. Obesity: Discussed portion control the importance of weight loss. Dr Bubba Camp is referring her to dietitian.  6. HTN- stable continue current medications 7. Dislocated R shoulder: currently in sling and being referred to ortho. 8. Chronic venous stasis  F/U 2 weeks  Rande Brunt NP-C 9:28 AM   Addendum: Patients pro-BNP and BMET came back after initiation of diuretics. Pro-BNP 1146 and creatinine 1.54 and BUN 42. Probably does not have much fluid on board and more venous statis and SOB from obesity hypoventilation. Will have her  hold diuretics for 3 days along with potassium and then go back to 80 mg q am and 40 mg q pm. With 40 meq of KCL.

## 2013-10-28 NOTE — Progress Notes (Signed)
Orthopedic Tech Progress Note Patient Details:  Susan [name] 11-23-1950 [id number]  Ortho Devices Type of Ortho Device: Arm sling Ortho Device/Splint Location: sling and swathe Ortho Device/Splint Interventions: Application   Theodoro Parma Cammer 10/28/2013, 8:40 AM

## 2013-10-28 NOTE — ED Notes (Signed)
Portable x-ray at bedside to shoot portable shoulder.

## 2013-10-28 NOTE — ED Notes (Signed)
X-ray confirms that shoulder is not reduced. Pt shoulder still dislocated.

## 2013-10-28 NOTE — ED Notes (Signed)
Fell 1 week ago, was seen here at that time, R shoulder was dislocated. Pt believes R shoulder is back out. C/o R shoulder pain, 10/10. Denies sx other than pain. CMS intact. ROM decreased d/t pain. Deformity difficult to discern d/t body habitus. Multiple bruises noted to face and R arm "from fall".

## 2013-10-28 NOTE — ED Provider Notes (Signed)
CSN: [id number]     Arrival date & time 10/28/13  0444 History   First MD Initiated Contact with Patient 10/28/13 0448     Chief Complaint  Patient presents with  . Shoulder Pain  . Shoulder Injury     (Consider location/radiation/quality/duration/timing/severity/associated sxs/prior Treatment) HPI  This is a 63 year old female with a recent fall resulting in shoulder dislocation who presents with right shoulder pain. The patient states that she was going to the restroom and had taken off her sling. She had acute onset of pain while she was trying to pull up her pants. She denies any new injury. Pain is with range of motion. Currently her pain is 8/10.  She denies any weakness, numbness, or tingling of the arm.   Past Medical History  Diagnosis Date  . Tachycardia     Sinus tachycardia  . Dyslipidemia   . Hypothyroidism   . CAD (coronary artery disease)     MI in 2000 - MI  2007 - treated bare metal stent (no nuclear since then as 9/11)  . HTN (hypertension)   . Anemia   . Chronic diastolic heart failure     a) ECHO (08/2013) EF 55-60% and RV function nl b) RHC (08/2013) RA 4, RV 30/5/7, PA 25/10 (16), PCWP 7, Fick CO/CI 6.3/2.7, PVR 1.5 WU, PA 61 and 66%  . RBBB (right bundle branch block)     Old  . Renal insufficiency   . Urinary incontinence   . Obesity   . Syncope     likely due to low blood sugar  . Carotid artery disease   . Acute MI 1999; 2007  . CHF (congestive heart failure)   . Anxiety   . Depression   . Type II diabetes mellitus   . Osteoarthritis   . Arthritis   . Peripheral neuropathy   . Venous insufficiency   . Stroke     mini strokes   Past Surgical History  Procedure Laterality Date  . Knee arthroscopy Left 10/25/2006  . Coronary angioplasty with stent placement  1999; 2007    "1 + 1"  . Abdominal hysterectomy  1980's  . Tubal ligation  1970's  . Cataract extraction, bilateral Bilateral ?2013   Family History  Problem Relation Age of Onset  .  Coronary artery disease    . Heart attack Mother    History  Substance Use Topics  . Smoking status: Former Smoker -- 3.00 packs/day for 32 years    Types: Cigarettes    Quit date: 05/26/1997  . Smokeless tobacco: Never Used  . Alcohol Use: No   OB History   Grav Para Term Preterm Abortions TAB SAB Ect Mult Living                 Review of Systems  Musculoskeletal:       Right shoulder pain  Neurological: Negative for weakness and numbness.  All other systems reviewed and are negative.     Allergies  Codeine  Home Medications   Prior to Admission medications   Medication Sig Start Date End Date Taking? Authorizing Provider  albuterol (PROVENTIL HFA;VENTOLIN HFA) 108 (90 BASE) MCG/ACT inhaler Inhale 2 puffs into the lungs every 6 (six) hours as needed for wheezing or shortness of breath.   Yes Historical Provider, MD  Ascorbic Acid (VITAMIN C PO) Take 1 tablet by mouth daily.   Yes Historical Provider, MD  aspirin EC 81 MG tablet Take 81 mg by mouth  daily.   Yes Historical Provider, MD  atorvastatin (LIPITOR) 80 MG tablet Take 80 mg by mouth daily at 6 PM.   Yes Historical Provider, MD  carvedilol (COREG) 12.5 MG tablet Take 12.5 mg by mouth 2 (two) times daily with a meal.   Yes Historical Provider, MD  Cholecalciferol (VITAMIN D PO) Take 1 tablet by mouth daily.   Yes Historical Provider, MD  clonazePAM (KLONOPIN) 1 MG tablet Take 1 tablet (1 mg total) by mouth 3 (three) times daily as needed for anxiety. 08/10/13  Yes [name], PA-C  clopidogrel (PLAVIX) 75 MG tablet Take 75 mg by mouth daily with breakfast.   Yes Historical Provider, MD  Cyanocobalamin (VITAMIN B12 PO) Take 1 tablet by mouth daily.   Yes Historical Provider, MD  fish oil-omega-3 fatty acids 1000 MG capsule Take 1 g by mouth daily.    Yes Historical Provider, MD  insulin NPH-regular Human (NOVOLIN 70/30) (70-30) 100 UNIT/ML injection Inject 30-60 Units into the skin 3 (three) times daily with meals.  Take 60 units in am , 30 units at lunch, and 60 units in the evening   Yes Historical Provider, MD  isosorbide dinitrate (ISORDIL) 10 MG tablet Take 10 mg by mouth 2 (two) times daily.   Yes Historical Provider, MD  levofloxacin (LEVAQUIN) 500 MG tablet Take 500 mg by mouth daily.   Yes Historical Provider, MD  levothyroxine (SYNTHROID, LEVOTHROID) 50 MCG tablet Take 50 mcg by mouth daily before breakfast.   Yes Historical Provider, MD  losartan (COZAAR) 50 MG tablet Take 50 mg by mouth daily.   Yes Historical Provider, MD  metFORMIN (GLUCOPHAGE) 500 MG tablet Take 500 mg by mouth daily.   Yes Historical Provider, MD  metolazone (ZAROXOLYN) 5 MG tablet Take 5 mg by mouth daily as needed (fluid).   Yes Historical Provider, MD  Multiple Vitamin (MULTIVITAMIN WITH MINERALS) TABS tablet Take 1 tablet by mouth daily.   Yes Historical Provider, MD  nitroGLYCERIN (NITROSTAT) 0.4 MG SL tablet Place 1 tablet (0.4 mg total) under the tongue every 5 (five) minutes as needed for chest pain. 05/06/13  Yes Carlena Bjornstad, MD  oxyCODONE-acetaminophen (PERCOCET/ROXICET) 5-325 MG per tablet Take 1-2 tablets by mouth every 6 (six) hours as needed for moderate pain or severe pain. 10/21/13  Yes Kalman Drape, MD  PARoxetine (PAXIL-CR) 25 MG 24 hr tablet Take 25 mg by mouth daily. Taken with 37.5 for a total = 62.5mg    Yes Historical Provider, MD  PARoxetine (PAXIL-CR) 37.5 MG 24 hr tablet Take 37.5 mg by mouth daily. Taken with 25mg  for a total = 62.5 mg   Yes Historical Provider, MD  Potassium Chloride ER 20 MEQ TBCR Take 40 mEq by mouth 2 (two) times daily. 10/20/13  Yes Amy D Clegg, NP  predniSONE (DELTASONE) 20 MG tablet Take 20 mg by mouth daily with breakfast.   Yes Historical Provider, MD  pregabalin (LYRICA) 150 MG capsule Take 150 mg by mouth 3 (three) times daily.   Yes Historical Provider, MD  torsemide (DEMADEX) 20 MG tablet Take 80 mg in am and 40 mg in pm. 10/20/13  Yes Amy D Clegg, NP  triamcinolone cream  (KENALOG) 0.1 % Apply 1 application topically 2 (two) times daily.   Yes Historical Provider, MD  oxyCODONE-acetaminophen (PERCOCET/ROXICET) 5-325 MG per tablet Take 1 tablet by mouth every 6 (six) hours as needed for moderate pain or severe pain. 10/28/13   Merryl Hacker, MD  BP 152/96  Pulse 92  Temp(Src) 98.6 F (37 C) (Oral)  Resp 18  SpO2 100% Physical Exam  Nursing note and vitals reviewed. Constitutional: She is oriented to person, place, and time. She appears well-developed and well-nourished. No distress.  HENT:  Head: Normocephalic.  Mouth/Throat: Oropharynx is clear and moist.  Extensive ecchymosis about the right eye, right cheek and lower face, appears subacute  Cardiovascular: Normal rate, regular rhythm and normal heart sounds.   No murmur heard. Pulmonary/Chest: Effort normal and breath sounds normal. No respiratory distress. She has no wheezes.  Abdominal: Soft. There is no tenderness.  Musculoskeletal:  Limited range of motion of the right shoulder, indentation noted at the glenoid fossa, bruising noted over the lateral shoulder, neurovascularly intact distally  Neurological: She is alert and oriented to person, place, and time.  Skin: Skin is warm and dry.  Psychiatric: She has a normal mood and affect.    ED Course  Reduction of dislocation Date/Time: 10/28/2013 8:05 AM Performed by: Thayer Jew, F Authorized by: Thayer Jew, F Consent: Verbal consent obtained. written consent obtained. Risks and benefits: risks, benefits and alternatives were discussed Consent given by: patient Patient identity confirmed: verbally with patient Time out: Immediately prior to procedure a "time out" was called to verify the correct patient, procedure, equipment, support staff and site/side marked as required. Patient sedated: yes Sedation type: moderate (conscious) sedation Sedatives: etomidate Sedation start date/time: 10/28/2013 7:06 AM Sedation end date/time:  10/28/2013 7:15 AM Vitals: Vital signs were monitored during sedation. Patient tolerance: Patient tolerated the procedure well with no immediate complications. Comments: Attempt at reduction x2. Initial attempt failed. Patient tolerated the procedure well under sedation. Shoulder relocated with traction and gentle abduction.   (including critical care time) Labs Review Labs Reviewed - No data to display  Imaging Review Dg Shoulder Right  10/28/2013   CLINICAL DATA:  Shoulder pain and injury  EXAM: RIGHT SHOULDER - 2+ VIEW  COMPARISON:  10/21/2013  FINDINGS: Anterior glenohumeral dislocation. There is no evidence of fracture, although there is suboptimal image penetration.  Sclerotic focus in the right humeral head has been present and unchanged since at least 2012, consistent with bone island.  IMPRESSION: Anterior glenohumeral dislocation.   Electronically Signed   By: Jorje Guild M.D.   On: 10/28/2013 05:42   Dg Shoulder Right Port  10/28/2013   CLINICAL DATA:  Status post reduction  EXAM: PORTABLE RIGHT SHOULDER - 2+ VIEW  COMPARISON:  Film from earlier in the same day  FINDINGS: There is been interval reduction of the humeral head into the glenoid labrum. No definitive fracture is seen. No soft tissue abnormality is noted.   Electronically Signed   By: Inez Catalina M.D.   On: 10/28/2013 07:47   Dg Shoulder Right Port  10/28/2013   CLINICAL DATA:  Status post reduction  EXAM: PORTABLE RIGHT SHOULDER - 2+ VIEW  COMPARISON:  10/28/2013  FINDINGS: There remains anterior inferior dislocation of the humeral head with respect to the glenoid labrum.   Electronically Signed   By: Inez Catalina M.D.   On: 10/28/2013 07:32     EKG Interpretation None      MDM   Final diagnoses:  Shoulder dislocation, recurrent    Patient presents with right shoulder dislocation recurrent. Shoulder relocated without complication. Patient was noted to move her arm while in a sling. I have encouraged her that her  shoulder joint is very lucid this time and she should limit motion. She  will be referred to orthopedics. Patient stated understanding. She will be given a short course of pain medications.  After history, exam, and medical workup I feel the patient has been appropriately medically screened and is safe for discharge home. Pertinent diagnoses were discussed with the patient. Patient was given return precautions.     Merryl Hacker, MD 10/28/13 [phone]

## 2013-10-28 NOTE — ED Notes (Signed)
Pt to xray, alert, NAD, calm, no dyspnea noted, no changes.

## 2013-10-28 NOTE — Sedation Documentation (Signed)
Vital signs stable. 

## 2013-10-28 NOTE — Discharge Instructions (Signed)
Shoulder Dislocation Keep your arm in a sling at all times until you are able to follow-up with ortho.  Your joint is very loose and will be prone to popping back out.  Your shoulder is made up of three bones: the collar bone (clavicle); the shoulder blade (scapula), which includes the socket (glenoid cavity); and the upper arm bone (humerus). Your shoulder joint is the place where these bones meet. Strong, fibrous tissues hold these bones together (ligaments). Muscles and strong, fibrous tissues that connect the muscles to these bones (tendons) allow your arm to move through this joint. The range of motion of your shoulder joint is more extensive than most of your other joints, and the glenoid cavity is very shallow. That is the reason that your shoulder joint is one of the most unstable joints in your body. It is far more prone to dislocation than your other joints. Shoulder dislocation is when your humerus is forced out of your shoulder joint. CAUSES Shoulder dislocation is caused by a forceful impact on your shoulder. This impact usually is from an injury, such as a sports injury or a fall. SYMPTOMS Symptoms of shoulder dislocation include:  Deformity of your shoulder.  Intense pain.  Inability to move your shoulder joint.  Numbness, weakness, or tingling around your shoulder joint (your neck or down your arm).  Bruising or swelling around your shoulder. DIAGNOSIS In order to diagnose a dislocated shoulder, your caregiver will perform a physical exam. Your caregiver also may have an X-ray exam done to see if you have any broken bones. Magnetic resonance imaging (MRI) is a procedure that sometimes is done to help your caregiver see any damage to the soft tissues around your shoulder, particularly your rotator cuff tendons. Additionally, your caregiver also may have electromyography done to measure the electrical discharges produced in your muscles if you have signs or symptoms of nerve  damage. TREATMENT A shoulder dislocation is treated by placing the humerus back in the joint (reduction). Your caregiver does this either manually (closed reduction), by moving your humerus back into the joint through manipulation, or through surgery (open reduction). When your humerus is back in place, severe pain should improve almost immediately. You also may need to have surgery if you have a weak shoulder joint or ligaments, and you have recurring shoulder dislocations, despite rehabilitation. In rare cases, surgery is necessary if your nerves or blood vessels are damaged during the dislocation. After your reduction, your arm will be placed in a shoulder immobilizer or sling to keep it from moving. Your caregiver will have you wear your shoulder immoblizer or sling for 3 days to 3 weeks, depending on how serious your dislocation is. When your shoulder immobilizer or sling is removed, your caregiver may prescribe physical therapy to help improve the range of motion in your shoulder joint. HOME CARE INSTRUCTIONS  The following measures can help to reduce pain and speed up the healing process:  Rest your injured joint. Do not move it. Avoid activities similar to the one that caused your injury.  Apply ice to your injured joint for the first day or two after your reduction or as directed by your caregiver. Applying ice helps to reduce inflammation and pain.  Put ice in a plastic bag.  Place a towel between your skin and the bag.  Leave the ice on for 15-20 minutes at a time, every 2 hours while you are awake.  Exercise your hand by squeezing a soft ball. This helps to  eliminate stiffness and swelling in your hand and wrist.  Take over-the-counter or prescription medicine for pain or discomfort as told by your caregiver. SEEK IMMEDIATE MEDICAL CARE IF:   Your shoulder immobilizer or sling becomes damaged.  Your pain becomes worse rather than better.  You lose feeling in your arm or hand,  or they become white and cold. MAKE SURE YOU:   Understand these instructions.  Will watch your condition.  Will get help right away if you are not doing well or get worse. Document Released: 02/04/2001 Document Revised: 08/04/2011 Document Reviewed: 03/02/2011 [facility] Patient Information 2014 [place], [place].

## 2013-10-28 NOTE — ED Notes (Addendum)
Back from xray, no changes, meds given.

## 2013-10-28 NOTE — ED Notes (Signed)
Ortho tech coming down to apply sling.

## 2013-10-28 NOTE — Patient Instructions (Signed)
Will hold diuretics lasix for Saturday, Sunday and Monday along with potassium. Then on Tuesday go back to taking lasix 80 mg in the am and 40 mg in the pm and then potassium 40 meq twice a day.  Follow up in 2 weeks.  Call any issues.  Do the following things EVERYDAY: 1) Weigh yourself in the morning before breakfast. Write it down and keep it in a log. 2) Take your medicines as prescribed 3) Eat low salt foods-Limit salt (sodium) to 2000 mg per day.  4) Stay as active as you can everyday 5) Limit all fluids for the day to less than 2 liters 6)

## 2013-10-28 NOTE — Sedation Documentation (Signed)
Pt placed on NRB mask.

## 2013-11-07 ENCOUNTER — Encounter (HOSPITAL_COMMUNITY): Payer: Commercial Managed Care - PPO

## 2013-11-09 ENCOUNTER — Ambulatory Visit: Payer: Commercial Managed Care - PPO | Admitting: Cardiology

## 2013-11-10 ENCOUNTER — Encounter (HOSPITAL_COMMUNITY): Payer: Commercial Managed Care - PPO

## 2013-11-12 ENCOUNTER — Other Ambulatory Visit: Payer: Self-pay | Admitting: Physician Assistant

## 2013-11-14 NOTE — Telephone Encounter (Signed)
Medication refilled per protocol. 

## 2013-11-16 ENCOUNTER — Other Ambulatory Visit: Payer: Self-pay | Admitting: Neurology

## 2013-12-05 ENCOUNTER — Other Ambulatory Visit (HOSPITAL_COMMUNITY): Payer: Self-pay | Admitting: Orthopedic Surgery

## 2013-12-05 DIAGNOSIS — M25511 Pain in right shoulder: Secondary | ICD-10-CM

## 2013-12-06 ENCOUNTER — Encounter (HOSPITAL_BASED_OUTPATIENT_CLINIC_OR_DEPARTMENT_OTHER): Payer: Commercial Managed Care - PPO | Attending: General Surgery

## 2013-12-06 DIAGNOSIS — L97809 Non-pressure chronic ulcer of other part of unspecified lower leg with unspecified severity: Secondary | ICD-10-CM | POA: Diagnosis not present

## 2013-12-06 DIAGNOSIS — E663 Overweight: Secondary | ICD-10-CM | POA: Diagnosis not present

## 2013-12-06 DIAGNOSIS — Z79899 Other long term (current) drug therapy: Secondary | ICD-10-CM | POA: Insufficient documentation

## 2013-12-06 DIAGNOSIS — Z794 Long term (current) use of insulin: Secondary | ICD-10-CM | POA: Diagnosis not present

## 2013-12-06 DIAGNOSIS — I10 Essential (primary) hypertension: Secondary | ICD-10-CM | POA: Insufficient documentation

## 2013-12-06 DIAGNOSIS — E119 Type 2 diabetes mellitus without complications: Secondary | ICD-10-CM | POA: Diagnosis not present

## 2013-12-06 LAB — GLUCOSE, CAPILLARY: Glucose-Capillary: 391 mg/dL — ABNORMAL HIGH (ref 70–99)

## 2013-12-07 ENCOUNTER — Ambulatory Visit: Payer: Commercial Managed Care - PPO | Admitting: *Deleted

## 2013-12-07 NOTE — Progress Notes (Signed)
[facility]  NAME:  Susan [name], Susan [name]                ACCOUNT NO.:  [id number]  MEDICAL RECORD NO.:  [id number]      DATE OF BIRTH:  Jan 25, 1951  PHYSICIAN:  Judene Companion, M.D.      VISIT DATE:  12/06/2013                                  OFFICE VISIT   This is a 63 year old obese lady who is a diabetic, and comes to [name] with a history of an ulcer on the anterior aspect of her right leg.  She is on several medicines for hypertension including Coreg, Lipitor, and Plavix.  She is also on Klonopin and Novolin insulin.  Her doctor had put her on the Levaquin for her ulcer.  She is also on Cozaar and Synthroid and Glucophage and Zaroxolyn. She states that this ulcer was draining and was about a quarter of an inch in diameter when her doctor sent her here.  They had put her on antibiotics and on examination on her first visit here, it is totally healed and I also noted at this time that she has good pedal pulses and that she is overweight.  She weighs 240 pounds and she is 5 feet 5 inches.  Her blood pressure is 82/53, pulse 82, temperature 97.5.  We told her to get some elastic stockings as I think compression would do her some good.  She does have some passive congestion color of her lower legs.  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  Other diagnoses are diabetes, hypertension, probable venous hypertension.     Judene Companion, M.D.     PP/MEDQ  D:  12/06/2013  T:  12/07/2013  Job:  [id number]

## 2013-12-21 ENCOUNTER — Ambulatory Visit (HOSPITAL_COMMUNITY): Admission: RE | Admit: 2013-12-21 | Payer: Commercial Managed Care - PPO | Source: Ambulatory Visit

## 2013-12-23 ENCOUNTER — Other Ambulatory Visit: Payer: Self-pay | Admitting: Orthopedic Surgery

## 2013-12-23 DIAGNOSIS — M25511 Pain in right shoulder: Secondary | ICD-10-CM

## 2013-12-27 ENCOUNTER — Other Ambulatory Visit: Payer: Commercial Managed Care - PPO

## 2013-12-31 ENCOUNTER — Ambulatory Visit
Admission: RE | Admit: 2013-12-31 | Discharge: 2013-12-31 | Disposition: A | Discharge status: Home / Self Care | Payer: Commercial Managed Care - PPO | Source: Ambulatory Visit | Attending: Orthopedic Surgery | Admitting: Orthopedic Surgery

## 2013-12-31 DIAGNOSIS — M25511 Pain in right shoulder: Secondary | ICD-10-CM

## 2014-01-02 ENCOUNTER — Other Ambulatory Visit: Payer: Self-pay | Admitting: Physician Assistant

## 2014-01-02 NOTE — Telephone Encounter (Signed)
May  refill for #90 + 0.

## 2014-01-02 NOTE — Telephone Encounter (Signed)
Ok to refill??  Last office visit/ refill 08/10/2013.

## 2014-01-04 ENCOUNTER — Other Ambulatory Visit (HOSPITAL_COMMUNITY): Payer: Self-pay | Admitting: Cardiology

## 2014-01-04 MED ORDER — CLOPIDOGREL BISULFATE 75 MG PO TABS: 75.0000 mg | ORAL_TABLET | Freq: Every day | ORAL | Status: DC

## 2014-01-10 ENCOUNTER — Ambulatory Visit: Payer: Commercial Managed Care - PPO | Admitting: *Deleted

## 2014-01-11 ENCOUNTER — Ambulatory Visit (HOSPITAL_COMMUNITY): Payer: Commercial Managed Care - PPO

## 2014-01-11 ENCOUNTER — Other Ambulatory Visit: Payer: Self-pay | Admitting: *Deleted

## 2014-01-11 MED ORDER — ATORVASTATIN CALCIUM 80 MG PO TABS: 80.0000 mg | ORAL_TABLET | Freq: Every day | ORAL | Status: DC

## 2014-01-12 ENCOUNTER — Other Ambulatory Visit (HOSPITAL_COMMUNITY): Payer: Self-pay | Admitting: Orthopedic Surgery

## 2014-01-27 ENCOUNTER — Encounter (HOSPITAL_COMMUNITY): Payer: Self-pay | Admitting: Pharmacy Technician

## 2014-02-01 ENCOUNTER — Encounter (HOSPITAL_COMMUNITY)
Admission: RE | Admit: 2014-02-01 | Discharge: 2014-02-01 | Disposition: A | Discharge status: Home / Self Care | Payer: Commercial Managed Care - PPO | Source: Ambulatory Visit | Attending: Anesthesiology | Admitting: Anesthesiology

## 2014-02-01 ENCOUNTER — Encounter (HOSPITAL_COMMUNITY)
Admission: RE | Admit: 2014-02-01 | Discharge: 2014-02-01 | Disposition: A | Discharge status: Home / Self Care | Payer: Commercial Managed Care - PPO | Source: Ambulatory Visit | Attending: Orthopedic Surgery | Admitting: Orthopedic Surgery

## 2014-02-01 ENCOUNTER — Encounter (HOSPITAL_COMMUNITY): Payer: Self-pay

## 2014-02-01 DIAGNOSIS — Z01810 Encounter for preprocedural cardiovascular examination: Secondary | ICD-10-CM | POA: Insufficient documentation

## 2014-02-01 DIAGNOSIS — M719 Bursopathy, unspecified: Secondary | ICD-10-CM | POA: Diagnosis present

## 2014-02-01 DIAGNOSIS — M67919 Unspecified disorder of synovium and tendon, unspecified shoulder: Secondary | ICD-10-CM | POA: Insufficient documentation

## 2014-02-01 DIAGNOSIS — Z01812 Encounter for preprocedural laboratory examination: Secondary | ICD-10-CM | POA: Insufficient documentation

## 2014-02-01 HISTORY — DX: Other specified postprocedural states: Z98.890

## 2014-02-01 HISTORY — DX: Other specified postprocedural states: R11.2

## 2014-02-01 LAB — BASIC METABOLIC PANEL
Anion gap: 14 (ref 5–15)
BUN: 54 mg/dL — ABNORMAL HIGH (ref 6–23)
CO2: 22 mEq/L (ref 19–32)
Calcium: 8.7 mg/dL (ref 8.4–10.5)
Chloride: 91 mEq/L — ABNORMAL LOW (ref 96–112)
Creatinine, Ser: 1.34 mg/dL — ABNORMAL HIGH (ref 0.50–1.10)
GFR calc Af Amer: 47 mL/min — ABNORMAL LOW (ref >90)
GFR calc non Af Amer: 41 mL/min — ABNORMAL LOW (ref >90)
Glucose, Bld: 592 mg/dL (ref 70–99)
Potassium: 4.9 mEq/L (ref 3.7–5.3)
Sodium: 127 mEq/L — ABNORMAL LOW (ref 137–147)

## 2014-02-01 LAB — CBC
HCT: 35.5 % — ABNORMAL LOW (ref 36.0–46.0)
Hemoglobin: 11.2 g/dL — ABNORMAL LOW (ref 12.0–15.0)
MCH: 25.2 pg — ABNORMAL LOW (ref 26.0–34.0)
MCHC: 31.5 g/dL (ref 30.0–36.0)
MCV: 80 fL (ref 78.0–100.0)
Platelets: 216 10*3/uL (ref 150–400)
RBC: 4.44 MIL/uL (ref 3.87–5.11)
RDW: 16 % — ABNORMAL HIGH (ref 11.5–15.5)
WBC: 7.7 10*3/uL (ref 4.0–10.5)

## 2014-02-01 NOTE — Progress Notes (Signed)
02/01/14 1302  OBSTRUCTIVE SLEEP APNEA  Have you ever been diagnosed with sleep apnea through a sleep study? No  Do you snore loudly (loud enough to be heard through closed doors)?  0  Do you often feel tired, fatigued, or sleepy during the daytime? 1  Has anyone observed you stop breathing during your sleep? 0  Do you have, or are you being treated for high blood pressure? 1  BMI more than 35 kg/m2? 1  Age over 64 years old? 1  Neck circumference greater than 40 cm/16 inches? 1 (16.5)  Gender: 0  Obstructive Sleep Apnea Score 5  Score 4 or greater  Results sent to PCP

## 2014-02-01 NOTE — Pre-Procedure Instructions (Addendum)
Susan [name]  02/01/2014   Your procedure is scheduled on:  02/07/14  Report to [facility] short stay admitting at 900 AM.  Call this number if you have problems the morning of surgery: [phone]   Remember:   Do not eat food or drink liquids after midnight.   Take these medicines the morning of surgery with A SIP OF WATER: inhaler(bring to hospital), paxil, carvedilol, isosorbide,clonazepam, pain med ,nitro if needed, levaquin,levothyroxine,      Take all meds as ordered until day of surgery except as instructed below or per dr     Bridgette Habermann all herbel meds, nsaids (aleve,naproxen,advil,ibuprofen) now including vitamins, aspirin, fish oil        PLAVIX PER DR  NO INSULIN OR METFORMIN AM OF SURGERY   Do not wear jewelry, make-up or nail polish.  Do not wear lotions, powders, or perfumes. You may wear deodorant.  Do not shave 48 hours prior to surgery. Men may shave face and neck.  Do not bring valuables to the hospital.  [facility] is not responsible                  for any belongings or valuables.               Contacts, dentures or bridgework may not be worn into surgery.  Leave suitcase in the car. After surgery it may be brought to your room.  For patients admitted to the hospital, discharge time is determined by your                treatment team.               Patients discharged the day of surgery will not be allowed to drive  home.  Name and phone number of your driver:   Special Instructions:  Special Instructions: [place] - Preparing for Surgery  Before surgery, you can play an important role.  Because skin is not sterile, your skin needs to be as free of germs as possible.  You can reduce the number of germs on you skin by washing with CHG (chlorahexidine gluconate) soap before surgery.  CHG is an antiseptic cleaner which kills germs and bonds with the skin to continue killing germs even after washing.  Please DO NOT use if you have an allergy to CHG or antibacterial  soaps.  If your skin becomes reddened/irritated stop using the CHG and inform your nurse when you arrive at Short Stay.  Do not shave (including legs and underarms) for at least 48 hours prior to the first CHG shower.  You may shave your face.  Please follow these instructions carefully:   1.  Shower with CHG Soap the night before surgery and the morning of Surgery.  2.  If you choose to wash your hair, wash your hair first as usual with your normal shampoo.  3.  After you shampoo, rinse your hair and body thoroughly to remove the Shampoo.  4.  Use CHG as you would any other liquid soap.  You can apply chg directly  to the skin and wash gently with scrungie or a clean washcloth.  5.  Apply the CHG Soap to your body ONLY FROM THE NECK DOWN.  Do not use on open wounds or open sores.  Avoid contact with your eyes ears, mouth and genitals (private parts).  Wash genitals (private parts)       with your normal soap.  6.  Wash thoroughly, paying special  attention to the area where your surgery will be performed.  7.  Thoroughly rinse your body with warm water from the neck down.  8.  DO NOT shower/wash with your normal soap after using and rinsing off the CHG Soap.  9.  Pat yourself dry with a clean towel.            10.  Wear clean pajamas.            11.  Place clean sheets on your bed the night of your first shower and do not sleep with pets.  Day of Surgery  Do not apply any lotions/deodorants the morning of surgery.  Please wear clean clothes to the hospital/surgery center.   Please read over the following fact sheets that you were given: Pain Booklet, Coughing and Deep Breathing and Surgical Site Infection Prevention

## 2014-02-01 NOTE — Progress Notes (Signed)
Dr Ron Parker office called re: d/c plavix.  Spoke with kristi . She will send message to dr Kae Heller nurse to call patient with day to stop plavix. If patient has not heard by Friday instructed her to call dr Ron Parker office.

## 2014-02-01 NOTE — Progress Notes (Signed)
Dr. Randel Pigg office called and spoke with Pamala Hurry who is sending message to the Nurse about the patient's glucose 592, and that she was planning to get food and take insulin when she left the hospital today.  She will have the Nurse call the patient and call me back to confirm that she has received the message.

## 2014-02-02 ENCOUNTER — Ambulatory Visit (INDEPENDENT_AMBULATORY_CARE_PROVIDER_SITE_OTHER): Payer: Commercial Managed Care - PPO | Admitting: Physician Assistant

## 2014-02-02 ENCOUNTER — Encounter: Payer: Self-pay | Admitting: Physician Assistant

## 2014-02-02 ENCOUNTER — Encounter (HOSPITAL_COMMUNITY): Payer: Self-pay | Admitting: Vascular Surgery

## 2014-02-02 VITALS — BP 138/76 | HR 84 | Temp 97.6°F | Resp 18 | Ht 67.0 in | Wt 284.0 lb

## 2014-02-02 DIAGNOSIS — E669 Obesity, unspecified: Secondary | ICD-10-CM

## 2014-02-02 DIAGNOSIS — F329 Major depressive disorder, single episode, unspecified: Secondary | ICD-10-CM

## 2014-02-02 DIAGNOSIS — F419 Anxiety disorder, unspecified: Secondary | ICD-10-CM

## 2014-02-02 DIAGNOSIS — F3289 Other specified depressive episodes: Secondary | ICD-10-CM

## 2014-02-02 DIAGNOSIS — F32A Depression, unspecified: Secondary | ICD-10-CM

## 2014-02-02 DIAGNOSIS — Z23 Encounter for immunization: Secondary | ICD-10-CM

## 2014-02-02 DIAGNOSIS — F341 Dysthymic disorder: Secondary | ICD-10-CM

## 2014-02-02 MED ORDER — PAROXETINE HCL ER 25 MG PO TB24: 25.0000 mg | ORAL_TABLET | Freq: Every day | ORAL | Status: DC

## 2014-02-02 MED ORDER — CLONAZEPAM 1 MG PO TABS: ORAL_TABLET | ORAL | Status: DC

## 2014-02-02 MED ORDER — PAROXETINE HCL ER 37.5 MG PO TB24: 37.5000 mg | ORAL_TABLET | Freq: Every day | ORAL | Status: DC

## 2014-02-02 NOTE — Progress Notes (Signed)
Patient ID: Kemba Leedom MRN: [id number], DOB: [birth year], 63 y.o. Date of Encounter: @DATE @  Chief Complaint:  Chief Complaint  Patient presents with  . Medication Refill    HPI: 63 y.o. year old white female  presents for routine followup visit.  She says that she fell in June. Sweeping her porch and got tripped up on the broad and fell. He is having surgery to the right shoulder next week. Saw cardiology yesterday for preop evaluation.  As well she is still seeing Dr. Bubba Camp routinely to manage her diabetes and her thyroid. In the past she had said that Dr. Leonie Man with the one prescribing her Lyrica. However she says that she has not continue to follow up with him in a long time and that Dr. Bubba Camp has been prescribing the Lyrica.   The main medications which I am managing are her Paxil and her Klonopin. At home, she lives with her husband and her son has been living  with them for about a year now. He has brain injury which has occurred secondary to an attempted suicide. At her last office visit with me which was 08/10/13, she reported a lot of stress with dealing with the son. She reported feeling a lot of episodes of feeling panicked and feeling like she was "about to lose it" and said that she really needed to increase the dose on her Klonopin to help control these symptoms. At that visit I did increase the dose to 1 mg but I did discuss with her my concern about this and the fact that these medications will cause your body to become dependent on them and issues regarding tolerance.  Today she says that she feels that the anxiety and depression and stress are stable with the current medications. She takes one of the Klonopin mid day and takes 2 of them at night to sleep.     Past Medical History  Diagnosis Date  . Tachycardia     Sinus tachycardia  . Dyslipidemia   . Hypothyroidism   . CAD (coronary artery disease)     MI in 2000 - MI  2007 - treated bare metal stent (no  nuclear since then as 9/11)  . HTN (hypertension)   . Chronic diastolic heart failure     a) ECHO (08/2013) EF 55-60% and RV function nl b) RHC (08/2013) RA 4, RV 30/5/7, PA 25/10 (16), PCWP 7, Fick CO/CI 6.3/2.7, PVR 1.5 WU, PA 61 and 66%  . RBBB (right bundle branch block)     Old  . Urinary incontinence   . Obesity   . Syncope     likely due to low blood sugar  . Carotid artery disease   . Acute MI 1999; 2007  . CHF (congestive heart failure)   . Anxiety   . Depression   . Type II diabetes mellitus   . Osteoarthritis   . Arthritis   . Peripheral neuropathy   . Venous insufficiency   . Stroke     mini strokes  . PONV (postoperative nausea and vomiting)   . Shortness of breath   . Renal insufficiency     DENIES  . Anemia     hx     Home Meds:  Outpatient Prescriptions Prior to Visit  Medication Sig Dispense Refill  . albuterol (PROVENTIL HFA;VENTOLIN HFA) 108 (90 BASE) MCG/ACT inhaler Inhale 2 puffs into the lungs every 6 (six) hours as needed for wheezing or shortness of breath.      Marland Kitchen  Ascorbic Acid (VITAMIN C PO) Take 1 tablet by mouth daily. 1000 mg      . aspirin EC 81 MG tablet Take 81 mg by mouth daily.      Marland Kitchen atorvastatin (LIPITOR) 80 MG tablet Take 1 tablet (80 mg total) by mouth daily at 6 PM.  30 tablet  0  . carvedilol (COREG) 12.5 MG tablet Take 12.5 mg by mouth 2 (two) times daily with a meal.      . Cholecalciferol (VITAMIN D PO) Take 1 tablet by mouth daily. 2000 mg      . clonazePAM (KLONOPIN) 1 MG tablet TAKE 1 TABLET THREE TIMES A DAY AS NEEDED FOR ANXIETY  90 tablet  0  . clopidogrel (PLAVIX) 75 MG tablet Take 1 tablet (75 mg total) by mouth daily with breakfast.  30 tablet  6  . Cyanocobalamin (VITAMIN B12 PO) Take 1 tablet by mouth daily. 1000 mg      . fish oil-omega-3 fatty acids 1000 MG capsule Take 1 g by mouth daily.       . insulin NPH-regular Human (NOVOLIN 70/30) (70-30) 100 UNIT/ML injection Inject 30-60 Units into the skin 3 (three) times daily  with meals. Take 60 units in am , 60 units at lunch, and 60 units in the evening      . isosorbide dinitrate (ISORDIL) 10 MG tablet Take 10 mg by mouth 2 (two) times daily.      Marland Kitchen levofloxacin (LEVAQUIN) 500 MG tablet Take 500 mg by mouth daily.      Marland Kitchen levothyroxine (SYNTHROID, LEVOTHROID) 50 MCG tablet Take 50 mcg by mouth daily before breakfast.      . losartan (COZAAR) 50 MG tablet TAKE 1 TABLET BY MOUTH DAILY  30 tablet  5  . metFORMIN (GLUCOPHAGE) 500 MG tablet Take 500 mg by mouth daily.      . metolazone (ZAROXOLYN) 5 MG tablet Take 5 mg by mouth daily as needed (fluid).      . Multiple Vitamin (MULTIVITAMIN WITH MINERALS) TABS tablet Take 1 tablet by mouth daily.      . nitroGLYCERIN (NITROSTAT) 0.4 MG SL tablet Place 1 tablet (0.4 mg total) under the tongue every 5 (five) minutes as needed for chest pain.  25 tablet  3  . oxyCODONE-acetaminophen (PERCOCET/ROXICET) 5-325 MG per tablet Take 1 tablet by mouth every 6 (six) hours as needed for moderate pain or severe pain.  10 tablet  0  . PARoxetine (PAXIL-CR) 25 MG 24 hr tablet Take 25 mg by mouth daily. Taken with 37.5 for a total = 62.5mg       . PARoxetine (PAXIL-CR) 37.5 MG 24 hr tablet Take 37.5 mg by mouth daily. Taken with 25mg  for a total = 62.5 mg      . Potassium Chloride ER 20 MEQ TBCR Take 40 mEq by mouth 2 (two) times daily.  120 tablet  6  . predniSONE (DELTASONE) 20 MG tablet Take 20 mg by mouth daily with breakfast.      . pregabalin (LYRICA) 150 MG capsule Take 150 mg by mouth 3 (three) times daily.      Marland Kitchen torsemide (DEMADEX) 20 MG tablet Take 80 mg in am and 40 mg in pm.  180 tablet  6   No facility-administered medications prior to visit.     Allergies:  Allergies  Allergen Reactions  . Codeine Nausea And Vomiting    History   Social History  . Marital Status: Married    Spouse Name:  N/A    Number of Children: 3  . Years of Education: 12th   Occupational History  .      unemploye   Social History Main  Topics  . Smoking status: Former Smoker -- 3.00 packs/day for 32 years    Types: Cigarettes    Quit date: 05/26/1997  . Smokeless tobacco: Never Used  . Alcohol Use: No  . Drug Use: No  . Sexual Activity: No   Other Topics Concern  . Not on file   Social History Narrative   Pt lives at home with her spouse.   Caffeine Use- 3 sodas daily    Family History  Problem Relation Age of Onset  . Coronary artery disease    . Heart attack Mother      Review of Systems:  See HPI for pertinent ROS. All other ROS negative.    Physical Exam: Blood pressure 138/76, pulse 84, temperature 97.6 F (36.4 C), temperature source Oral, resp. rate 18, height 5\' 7"  (1.702 m), weight 284 lb (128.822 kg)., Body mass index is 44.47 kg/(m^2). General:Obese WF. Appears in no acute distress. Neck: Supple. No thyromegaly. No lymphadenopathy. Lungs: Clear bilaterally to auscultation without wheezes, rales, or rhonchi. Breathing is unlabored. Heart: RRR with S1 S2. No murmurs, rubs, or gallops. Abdomen: Soft, non-tender, non-distended with normoactive bowel sounds. No hepatomegaly. No rebound/guarding. No obvious abdominal masses. Musculoskeletal:  Strength and tone normal for age. Extremities/Skin: Warm and dry.  No edema. Neuro: Alert and oriented X 3. Moves all extremities spontaneously. Gait is normal. CNII-XII grossly in tact. Psych:  Responds to questions appropriately with a normal affect.     ASSESSMENT AND PLAN:  63 y.o. year old female with   1. Depression - PARoxetine (PAXIL-CR) 37.5 MG 24 hr tablet; Take 1 tablet (37.5 mg total) by mouth daily. Taken with 25mg  for a total = 62.5 mg  Dispense: 30 tablet; Refill: 5 - PARoxetine (PAXIL-CR) 25 MG 24 hr tablet; Take 1 tablet (25 mg total) by mouth daily. Taken with 37.5 for a total = 62.5mg   Dispense: 30 tablet; Refill: 5  2. Obesity  3. Anxiety and depression - PARoxetine (PAXIL-CR) 37.5 MG 24 hr tablet; Take 1 tablet (37.5 mg total) by  mouth daily. Taken with 25mg  for a total = 62.5 mg  Dispense: 30 tablet; Refill: 5 - PARoxetine (PAXIL-CR) 25 MG 24 hr tablet; Take 1 tablet (25 mg total) by mouth daily. Taken with 37.5 for a total = 62.5mg   Dispense: 30 tablet; Refill: 5 - clonazePAM (KLONOPIN) 1 MG tablet; TAKE 1 TABLET THREE TIMES A DAY AS NEEDED FOR ANXIETY  Dispense: 90 tablet; Refill: 2 I TOLD HER THAT EACH PRESCIPTION OF KLONOPIN MUST LAST 30 DAYS AND I WILL NOT REFILL EARLY. SHOULD NOT NEED FURTHER REFILL FOR 3 MONTHS--DUE 05/04/2014 OR AFTER.  4. Need for prophylactic vaccination and inoculation against influenza - Flu Vaccine QUAD 36+ mos PF IM (Fluarix Quad PF)  Routine followup in 6 months or sooner if needed.  [address] [place], [place], [facility] 02/02/2014 12:23 PM

## 2014-02-02 NOTE — Progress Notes (Signed)
Anesthesia Chart Review:  Patient is a 63 year old female scheduled for right shoulder diagnostic operative arthroscopy, I biceps release, open subscapularis repair, open supraspinatus repair by Dr. Marlou Sa. Surgery was scheduled for 02/07/14, but was rescheduled for 02/14/14 due to hyperglycemia. She had recurrent right shoulder dislocation following a fall on 10/28/13.  History includes former smoker, CAD, inferior MI '00 and '07 s/p BMS RCA '00 and '07, tachycardia, right BBB, syncope related to hypoglycemia, chronic diastolic CHF (last hospitalization 09/2013), SOB, TIA, DM2, peripheral neuropathy, CKD, SOB, HTN, dyslipidemia, hypothyroidism, depression, anxiety, arthritis, anemia, venous insufficiency, post-operative N/V. BMI is consistent with morbid obesity. OSA screening score is 5. Dr. [name] is her endocrinologist.  PCP is listed as Dena Billet, PA-C who saw her today.  There was no mention of her significant hyperglycemia yesterday--assuming because this is managed by her endocrinologist Dr. Bubba Camp. Cardiologist is Dr. Ron Parker with last note 08/17/13. Dr. Kae Heller office called yesterday regarding perioperative instructions for Plavix.   Patient was instructed to contact his office if she had not received a response by 02/03/2014. I also notified Claiborne Billings at Dr. Randel Pigg office that this needed to be addressed.  Her last appointment was with Junie Bame, NP ([facility]) on 10/28/13 with overall improved volume status.  Cardiologist Dr. Haroldine Laws did clear her for surgery from a cardiac standpoint with low to moderate risk without need for further preoperative cardiac testing.   EKG on 09/21/13 showed: SR, occasional PVC, low voltage QRS, right BBB, inferior infarct (age undetermined).  [place] 09/22/13:  RA = 4  RV = 30/5/7  PA = 25/10 (16)  PCW = 7  Fick cardiac output/index = 6.3/2.7  PVR = 1.5 WU  FA sat = 93%  PA sat = 61%, 66%  Echo on 09/21/13 showed: - Left ventricle: The cavity size was normal. Systolic  function was normal. The estimated ejection fraction was in the range of 55% to 60%. Wall motion was normal; there were no regional wall motion abnormalities. - Left atrium: The atrium was moderately dilated. - Pericardium, extracardiac: A trivial pericardial effusion was identified.  LHC on 01/05/06 showed: Coronaries of the left main was normal. The LAD was small and did not wrap the apex. There was mid-30% stenosis. There was apical 25% stenosis. First diagonal was large with mid 25-30% stenosis. The circumflex and the AV groove was normal. There was a mid-obtuse marginal which was large and branching which had proximal 30% stenosis. Posterolateral was small and normal. The right coronary artery was dominant. There was proximal long  25% stenosis. There were two stents in the mid-segment. The first stent had long diffuse 25-30% stenosis. The second stent had in-stent mild luminal irregularities. The remainder of the vessel was free of high-grade disease including moderate-sized PDA.  LEFT VENTRICULOGRAM: The left ventriculogram was obtained in the RAO projection. The EF was 60% with normal wall motion. Occlusion residual nonobstructive coronary artery disease, well-preserved ejection fraction.  Carotid duplex on 04/27/12 showed: Mild atherosclerotic disease in the carotid arteries bilaterally. Estimated degree of stenosis in the internal carotid arteries is less than 50% bilaterally.  CXR on 02/01/14 showed: 1. No acute cardiopulmonary disease. 2. Improved aeration of lungs with persistent findings of chronic bronchitic change and bilateral infrahilar atelectasis.  Preoperative labs noted. H/H 11.2/35.5. Na 127, Cl 91, K 4.9, BUN/Cr 54/1.34, glucose of 592. Her glucose readings in Epic since 07/18/13 have ranged from 141 - 1005 (only 3 of the 13 were under 200. A1C on 09/20/13  was 11.1.  I spoke with Maudie Mercury at Dr. Randel Pigg office as she is the one who notified me that Dr. Marlou Sa was postponing surgery for a  week, and instructed patient she will need to have her diabetes better controlled prior to surgery.  She is on both insulin and oral agents.  She will need a repeat BMET preoperatively.  I'll ask our schedulers to set up a lab only appointment (prefer that she be least be fasting for 4 hours prior to that labs draw and to have taken her scheduled DM meds) for mid next week. I'll request records from Dr. [name]. I updated anesthesiologist Dr. Tobias Alexander.   [facility] Short Stay Center/Anesthesiology Phone [phone] 02/02/2014 4:54 PM

## 2014-02-09 ENCOUNTER — Other Ambulatory Visit: Payer: Self-pay | Admitting: Cardiology

## 2014-02-09 ENCOUNTER — Encounter (HOSPITAL_COMMUNITY)
Admission: RE | Admit: 2014-02-09 | Discharge: 2014-02-09 | Disposition: A | Discharge status: Home / Self Care | Payer: Commercial Managed Care - PPO | Source: Ambulatory Visit | Attending: Orthopedic Surgery | Admitting: Orthopedic Surgery

## 2014-02-09 DIAGNOSIS — M719 Bursopathy, unspecified: Secondary | ICD-10-CM | POA: Diagnosis present

## 2014-02-09 DIAGNOSIS — Z01812 Encounter for preprocedural laboratory examination: Secondary | ICD-10-CM | POA: Insufficient documentation

## 2014-02-09 DIAGNOSIS — M67919 Unspecified disorder of synovium and tendon, unspecified shoulder: Secondary | ICD-10-CM | POA: Diagnosis present

## 2014-02-09 LAB — BASIC METABOLIC PANEL
Anion gap: 16 — ABNORMAL HIGH (ref 5–15)
BUN: 45 mg/dL — ABNORMAL HIGH (ref 6–23)
CO2: 23 mEq/L (ref 19–32)
Calcium: 9 mg/dL (ref 8.4–10.5)
Chloride: 96 mEq/L (ref 96–112)
Creatinine, Ser: 1.79 mg/dL — ABNORMAL HIGH (ref 0.50–1.10)
GFR calc Af Amer: 33 mL/min — ABNORMAL LOW (ref >90)
GFR calc non Af Amer: 29 mL/min — ABNORMAL LOW (ref >90)
Glucose, Bld: 298 mg/dL — ABNORMAL HIGH (ref 70–99)
Potassium: 5.1 mEq/L (ref 3.7–5.3)
Sodium: 135 mEq/L — ABNORMAL LOW (ref 137–147)

## 2014-02-09 NOTE — Progress Notes (Signed)
Repeat bmet done today.

## 2014-02-10 NOTE — Progress Notes (Signed)
Anesthesia follow-up:  See my note from 02/02/14.  Patient came in yesterday for repeat BMET.  I had our staff ask her to try not to eat or drink for four hours before her lab appointment.  Glucose still elevated at 298, but improved from her 592 result on 02/01/14.  Cr was up to 1.79 (baseline since ~ 09/2013 shows a range ~ 1.3 -1.5; peak Cr 2.04 12/2012 during hospitalization for dehydration and acute renal failure).  Na 135, K 5.1. I have called the results to Maudie Mercury at Dr. Randel Pigg office and told her that patient will need an ISTAT8 on arrival to re-evaluate renal function for stability/improvement.  My faxed note also included that a further increase in her Cr or a fasting glucose above the 200 range could be issues for anesthesia.  I also told Maudie Mercury that patient had not stopped her Plavix yet because she was still waiting to hear back from Dr. Ron Parker.  I advised that if Dr. Marlou Sa wanted patient off Plavix for surgery, then he should have someone from his office follow-up with this.    Plans to proceed with depend of follow-up labs results and reassessment of patient on the day of surgery.  [facility] Short Stay Center/Anesthesiology Phone [phone] 02/10/2014 11:42 AM

## 2014-02-13 ENCOUNTER — Telehealth (HOSPITAL_COMMUNITY): Payer: Self-pay | Admitting: Vascular Surgery

## 2014-02-13 ENCOUNTER — Ambulatory Visit: Payer: Commercial Managed Care - PPO | Admitting: Physician Assistant

## 2014-02-13 NOTE — Telephone Encounter (Signed)
We only manage pt's HF, Dr Ron Parker is her primary cardiologist who manages her CAD he will need to make decision regarding holding plavix, note routed to [facility]

## 2014-02-13 NOTE — Telephone Encounter (Signed)
[facility] Susan [name] states a fax was sent about going off the plavix...   pt is having surgery tomorrow pt has not been directed to stop taking her plavix ... Please advise Asap

## 2014-02-14 ENCOUNTER — Encounter (HOSPITAL_COMMUNITY): Admission: RE | Payer: Self-pay | Source: Ambulatory Visit

## 2014-02-14 ENCOUNTER — Ambulatory Visit (HOSPITAL_COMMUNITY)
Admission: RE | Admit: 2014-02-14 | Payer: Commercial Managed Care - PPO | Source: Ambulatory Visit | Admitting: Orthopedic Surgery

## 2014-02-14 SURGERY — ARTHROSCOPY, SHOULDER WITH REPAIR, ROTATOR CUFF, OPEN
Anesthesia: General | Site: Shoulder | Laterality: Right

## 2014-02-15 ENCOUNTER — Ambulatory Visit: Payer: Commercial Managed Care - PPO | Admitting: Physician Assistant

## 2014-02-15 NOTE — Telephone Encounter (Signed)
OK for patient to go off Plavix for her shoulder surgery. Can be held for 5 days before surgery. Restart when stable post surgery.

## 2014-02-16 NOTE — Telephone Encounter (Signed)
**Note De-Identified  Obfuscation** Per Kim's request I faxed this note to [phone] to her attention. I did receive conformation that the fax went through successfully.

## 2014-02-17 ENCOUNTER — Ambulatory Visit (INDEPENDENT_AMBULATORY_CARE_PROVIDER_SITE_OTHER): Payer: Commercial Managed Care - PPO | Admitting: Cardiology

## 2014-02-17 ENCOUNTER — Encounter: Payer: Self-pay | Admitting: Cardiology

## 2014-02-17 VITALS — BP 124/82 | HR 96 | Ht 67.0 in | Wt 280.4 lb

## 2014-02-17 DIAGNOSIS — I10 Essential (primary) hypertension: Secondary | ICD-10-CM

## 2014-02-17 DIAGNOSIS — I739 Peripheral vascular disease, unspecified: Secondary | ICD-10-CM

## 2014-02-17 DIAGNOSIS — E785 Hyperlipidemia, unspecified: Secondary | ICD-10-CM

## 2014-02-17 DIAGNOSIS — I2510 Atherosclerotic heart disease of native coronary artery without angina pectoris: Secondary | ICD-10-CM

## 2014-02-17 DIAGNOSIS — I5042 Chronic combined systolic (congestive) and diastolic (congestive) heart failure: Secondary | ICD-10-CM

## 2014-02-17 DIAGNOSIS — I779 Disorder of arteries and arterioles, unspecified: Secondary | ICD-10-CM

## 2014-02-17 DIAGNOSIS — I509 Heart failure, unspecified: Secondary | ICD-10-CM

## 2014-02-17 DIAGNOSIS — Z01810 Encounter for preprocedural cardiovascular examination: Secondary | ICD-10-CM

## 2014-02-17 MED ORDER — ATORVASTATIN CALCIUM 80 MG PO TABS: 80.0000 mg | ORAL_TABLET | Freq: Every day | ORAL | Status: DC

## 2014-02-17 NOTE — Assessment & Plan Note (Signed)
The patient is on guideline directed therapy for her lipids. No change in therapy.

## 2014-02-17 NOTE — Assessment & Plan Note (Addendum)
The patient had a coronary intervention in 2007. There has been no exercise testing since. I feel it is appropriate to proceed with further nuclear exercise testing to assess her further in this setting.

## 2014-02-17 NOTE — Assessment & Plan Note (Signed)
Patient has mild carotid disease.

## 2014-02-17 NOTE — Assessment & Plan Note (Signed)
The patient has combined systolic and diastolic CHF. Her ejection fraction historically is in the 45% range. Management of her volume status is very difficult. Her diuretics are being adjusted through the heart failure program. I am not changing her meds today.

## 2014-02-17 NOTE — Progress Notes (Signed)
Patient ID: Susan [name], female   DOB: [birth year], 63 y.o.   MRN: [id number]    HPI  Patient is seen today for preop clearance for the possibility of shoulder surgery. Unfortunately she continues to have problems with controlling her volume status. The advanced heart failure team is working with her. She also has significant problem with her diabetes. Her overall cardiac status is stable today. She does have coronary disease but this has been stable.    Allergies  Allergen Reactions  . Codeine Nausea And Vomiting    Current Outpatient Prescriptions  Medication Sig Dispense Refill  . albuterol (PROVENTIL HFA;VENTOLIN HFA) 108 (90 BASE) MCG/ACT inhaler Inhale 2 puffs into the lungs every 6 (six) hours as needed for wheezing or shortness of breath.      . Ascorbic Acid (VITAMIN C PO) Take 1 tablet by mouth daily. 1000 mg      . aspirin EC 81 MG tablet Take 81 mg by mouth daily.      Marland Kitchen atorvastatin (LIPITOR) 80 MG tablet TAKE 1 TABLET DAILY AT 6PM (MUST KEEP APPT WITH DR.Minervia Osso)  30 tablet  0  . carvedilol (COREG) 12.5 MG tablet Take 12.5 mg by mouth 2 (two) times daily with a meal.      . Cholecalciferol (VITAMIN D PO) Take 1 tablet by mouth daily. 2000 mg      . clonazePAM (KLONOPIN) 1 MG tablet TAKE 1 TABLET THREE TIMES A DAY AS NEEDED FOR ANXIETY  90 tablet  2  . clopidogrel (PLAVIX) 75 MG tablet Take 1 tablet (75 mg total) by mouth daily with breakfast.  30 tablet  6  . Cyanocobalamin (VITAMIN B12 PO) Take 1 tablet by mouth daily. 1000 mg      . fish oil-omega-3 fatty acids 1000 MG capsule Take 1 g by mouth daily.       . insulin NPH-regular Human (NOVOLIN 70/30) (70-30) 100 UNIT/ML injection Inject 30-60 Units into the skin 3 (three) times daily with meals. 75 units three times daily      . isosorbide dinitrate (ISORDIL) 10 MG tablet Take 10 mg by mouth 2 (two) times daily.      Marland Kitchen levofloxacin (LEVAQUIN) 500 MG tablet Take 500 mg by mouth daily.      Marland Kitchen levothyroxine (SYNTHROID,  LEVOTHROID) 50 MCG tablet Take 50 mcg by mouth daily before breakfast.      . losartan (COZAAR) 50 MG tablet TAKE 1 TABLET BY MOUTH DAILY  30 tablet  5  . metFORMIN (GLUCOPHAGE) 500 MG tablet Take 500 mg by mouth 2 (two) times daily.       . metolazone (ZAROXOLYN) 5 MG tablet Take 5 mg by mouth daily as needed (fluid).      . Multiple Vitamin (MULTIVITAMIN WITH MINERALS) TABS tablet Take 1 tablet by mouth daily.      . nitroGLYCERIN (NITROSTAT) 0.4 MG SL tablet Place 1 tablet (0.4 mg total) under the tongue every 5 (five) minutes as needed for chest pain.  25 tablet  3  . PARoxetine (PAXIL-CR) 25 MG 24 hr tablet Take 1 tablet (25 mg total) by mouth daily. Taken with 37.5 for a total = 62.5mg   30 tablet  5  . PARoxetine (PAXIL-CR) 37.5 MG 24 hr tablet Take 1 tablet (37.5 mg total) by mouth daily. Taken with 25mg  for a total = 62.5 mg  30 tablet  5  . Potassium Chloride ER 20 MEQ TBCR Take 40 mEq by mouth 2 (two) times daily.  120 tablet  6  . pregabalin (LYRICA) 150 MG capsule Take 150 mg by mouth daily.       Marland Kitchen torsemide (DEMADEX) 20 MG tablet Take 80 mg in am and 40 mg in pm.  180 tablet  6   No current facility-administered medications for this visit.    History   Social History  . Marital Status: Married    Spouse Name: N/A    Number of Children: 3  . Years of Education: 12th   Occupational History  .      unemploye   Social History Main Topics  . Smoking status: Former Smoker -- 3.00 packs/day for 32 years    Types: Cigarettes    Quit date: 05/26/1997  . Smokeless tobacco: Never Used  . Alcohol Use: No  . Drug Use: No  . Sexual Activity: No   Other Topics Concern  . Not on file   Social History Narrative   Pt lives at home with her spouse.   Caffeine Use- 3 sodas daily    Family History  Problem Relation Age of Onset  . Coronary artery disease    . Heart attack Mother     Past Medical History  Diagnosis Date  . Tachycardia     Sinus tachycardia  .  Dyslipidemia   . Hypothyroidism   . CAD (coronary artery disease)     MI in 2000 - MI  2007 - treated bare metal stent (no nuclear since then as 9/11)  . HTN (hypertension)   . Chronic diastolic heart failure     a) ECHO (08/2013) EF 55-60% and RV function nl b) RHC (08/2013) RA 4, RV 30/5/7, PA 25/10 (16), PCWP 7, Fick CO/CI 6.3/2.7, PVR 1.5 WU, PA 61 and 66%  . RBBB (right bundle branch block)     Old  . Urinary incontinence   . Obesity   . Syncope     likely due to low blood sugar  . Carotid artery disease   . Acute MI 1999; 2007  . CHF (congestive heart failure)   . Anxiety   . Depression   . Type II diabetes mellitus   . Osteoarthritis   . Arthritis   . Peripheral neuropathy   . Venous insufficiency   . Stroke     mini strokes  . PONV (postoperative nausea and vomiting)   . Shortness of breath   . Renal insufficiency     DENIES  . Anemia     hx    Past Surgical History  Procedure Laterality Date  . Knee arthroscopy Left 10/25/2006  . Coronary angioplasty with stent placement  1999; 2007    "1 + 1"  . Abdominal hysterectomy  1980's  . Tubal ligation  1970's  . Cataract extraction, bilateral Bilateral ?2013    Patient Active Problem List   Diagnosis Date Noted  . Morbid obesity 09/23/2013  . Acute on chronic systolic heart failure 09/20/2013  . Snoring 08/11/2013  . Chronic combined systolic and diastolic CHF (congestive heart failure) 06/03/2013  . CKD (chronic kidney disease), stage II 06/03/2013  . Chronic diastolic CHF (congestive heart failure) 05/06/2013  . Carotid artery disease   . Diabetes mellitus 12/25/2012  . Generalized weakness 12/25/2012  . Anxiety and depression 12/25/2012  . Obesity   . Urinary incontinence   . Renal insufficiency   . Depression 11/12/2010  . Ejection fraction   . RBBB (right bundle branch block)   . Tachycardia   . CAD (  coronary artery disease)   . DYSLIPIDEMIA 01/22/2009  . HYPERTENSION 01/22/2009  . Shortness of  breath 01/22/2009    ROS   Patient denies fever, chills, headache, sweats, rash, change in vision, change in hearing, chest pain, cough, nausea vomiting, urinary symptoms. All other systems are reviewed and are negative.  PHYSICAL EXAM  Patient is overweight. She is oriented to person time and place. Affect is normal. Head is atraumatic. Sclera and conjunctiva are normal. There is no jugulovenous distention. Lungs are clear. Respiratory effort is nonlabored. Cardiac exam reveals S1 and S2. The abdomen is soft. She has peripheral edema.  Filed Vitals:   02/17/14 1440  BP: 124/82  Pulse: 96  Height: 5\' 7"  (1.702 m)  Weight: 280 lb 6.4 oz (127.189 kg)  SpO2: 95%     ASSESSMENT & PLAN

## 2014-02-17 NOTE — Assessment & Plan Note (Signed)
Blood pressures control. No change in therapy. 

## 2014-02-17 NOTE — Patient Instructions (Signed)
**Note De-identified  Obfuscation** Your physician recommends that you continue on your current medications as directed. Please refer to the Current Medication list given to you today.  Your physician has requested that you have a lexiscan myoview. For further information please visit [email or web address]. Please follow instruction sheet, as given.  Your physician wants you to follow-up in: 6 months You will receive a reminder letter in the mail two months in advance. If you don't receive a letter, please call our office to schedule the follow-up appointment.   

## 2014-02-17 NOTE — Assessment & Plan Note (Signed)
At this time the patient is receiving further assessment of her diabetic situation before any shoulder surgery can be done. We will proceed with a stress nuclear scan to further assess her cardiac status. If there is no significant ischemia she can be cleared from the cardiac viewpoint. This study will be arranged soon.  As part of today's evaluation I have spent greater than 25 minutes with her total care. More than half of this time is been with direct contact with the patient.

## 2014-02-24 ENCOUNTER — Encounter: Payer: Self-pay | Admitting: Internal Medicine

## 2014-02-24 ENCOUNTER — Other Ambulatory Visit (HOSPITAL_COMMUNITY): Payer: Self-pay | Admitting: Orthopedic Surgery

## 2014-03-01 ENCOUNTER — Other Ambulatory Visit: Payer: Self-pay | Admitting: *Deleted

## 2014-03-01 ENCOUNTER — Ambulatory Visit (HOSPITAL_COMMUNITY): Payer: Commercial Managed Care - PPO | Attending: Internal Medicine | Admitting: Radiology

## 2014-03-01 VITALS — BP 140/80 | HR 86 | Ht 67.0 in | Wt 268.0 lb

## 2014-03-01 DIAGNOSIS — Z794 Long term (current) use of insulin: Secondary | ICD-10-CM | POA: Insufficient documentation

## 2014-03-01 DIAGNOSIS — R002 Palpitations: Secondary | ICD-10-CM | POA: Insufficient documentation

## 2014-03-01 DIAGNOSIS — I4510 Unspecified right bundle-branch block: Secondary | ICD-10-CM | POA: Diagnosis not present

## 2014-03-01 DIAGNOSIS — I2510 Atherosclerotic heart disease of native coronary artery without angina pectoris: Secondary | ICD-10-CM | POA: Diagnosis not present

## 2014-03-01 DIAGNOSIS — Z01810 Encounter for preprocedural cardiovascular examination: Secondary | ICD-10-CM

## 2014-03-01 DIAGNOSIS — E109 Type 1 diabetes mellitus without complications: Secondary | ICD-10-CM | POA: Insufficient documentation

## 2014-03-01 DIAGNOSIS — R0602 Shortness of breath: Secondary | ICD-10-CM | POA: Insufficient documentation

## 2014-03-01 DIAGNOSIS — R079 Chest pain, unspecified: Secondary | ICD-10-CM | POA: Insufficient documentation

## 2014-03-01 DIAGNOSIS — I10 Essential (primary) hypertension: Secondary | ICD-10-CM | POA: Diagnosis not present

## 2014-03-01 MED ORDER — ISOSORBIDE DINITRATE 10 MG PO TABS: 10.0000 mg | ORAL_TABLET | Freq: Two times a day (BID) | ORAL | Status: DC

## 2014-03-01 MED ORDER — TECHNETIUM TC 99M SESTAMIBI GENERIC - CARDIOLITE
33.0000 | Freq: Once | INTRAVENOUS | Status: AC | PRN
  Administered 2014-03-01: 33 via INTRAVENOUS

## 2014-03-01 MED ORDER — REGADENOSON 0.4 MG/5ML IV SOLN
0.4000 mg | Freq: Once | INTRAVENOUS | Status: AC
Start: 2014-03-01 — End: 2014-03-01
  Administered 2014-03-01: 0.4 mg via INTRAVENOUS

## 2014-03-01 NOTE — Progress Notes (Signed)
[facility] [address] [place], [place] [address] [phone]    Cardiology Nuclear Med Study  Susan [name] is a 63 y.o. female     MRN : [id number]     DOB: November 26, 1950  Procedure Date: 03/01/2014  Nuclear Med Background Indication for Stress Test:  Evaluation for Ischemia, and  Pending Surgical Clearance for (R) Shoulder by Alphonzo Severance, MD History:  CAD, MPI 2003 EF 51% Cardiac Risk Factors: Carotid Disease, Hypertension, IDDM,and RBBB  Symptoms:  Chest Pain (last date of chest discomfort was this morning), Palpitations and SOB   Nuclear Pre-Procedure Caffeine/Decaff Intake:  None> 12 hrs NPO After: 8:00pm   Lungs:  clear O2 Sat: 93% on room air. IV 0.9% NS with Angio Cath:  22g  IV Site: R Forearm x 1, tolerated well IV Started by:  Irven Baltimore, RN  Chest Size (in):  42 Cup Size: DD  Height: 5\' 7"  (1.702 m)  Weight:  268 lb (121.564 kg)  BMI:  Body mass index is 41.96 kg/(m^2). Tech Comments:  Patient held Coreg x 12 hrs. Fasting CBG was 347 at 9:30 am.Patient took 1/2 dose of insulin.     Nuclear Med Study 1 or 2 day study: 2 day  Stress Test Type:  Carlton Adam  Reading MD: N/A  Order Authorizing Provider:  Dola Argyle, MD  Resting Radionuclide: Technetium 14m Sestamibi  Resting Radionuclide Dose: 33.0 mCi on 03/07/2014  Stress Radionuclide:  Technetium 54m Sestamibi  Stress Radionuclide Dose: 33.0 mCi on 03/01/2014          Stress Protocol Rest HR: 86 Stress HR: 96  Rest BP: 140/80 Stress BP: 120/78  Exercise Time (min): n/a METS: n/a           Dose of Adenosine (mg):  n/a Dose of Lexiscan: 0.4 mg  Dose of Atropine (mg): n/a Dose of Dobutamine: n/a mcg/kg/min (at max HR)  Stress Test Technologist: Glade Lloyd, BS-ES  Nuclear Technologist:  Earl Many, CNMT     Rest Procedure:  Myocardial perfusion imaging was performed at rest 45 minutes following the intravenous administration of Technetium 37m Sestamibi. Rest ECG: Sinus  rhythm with PACs and PVCs, RBBB, cannot R/O prior inferior MI.  Stress Procedure:  The patient received IV Lexiscan 0.4 mg over 15-seconds.  Technetium 60m Sestamibi injected at 30-seconds.  Quantitative spect images were obtained after a 45 minute delay.  During the infusion of Lexiscan the patient complained of SOB that resolved in recovery.  Stress ECG: No significant ST segment change suggestive of ischemia.  QPS Raw Data Images:  There is interference from nuclear activity from structures below the diaphragm. This does not affect the ability to read the study. Stress Images:  There is decreased uptake in the inferior apical wall. Rest Images:  Normal homogeneous uptake in all areas of the myocardium. Subtraction (SDS):  These findings are consistent with ischemia. Transient Ischemic Dilatation (Normal <1.22):  0.82 Lung/Heart Ratio (Normal <0.45):  0.43  Quantitative Gated Spect Images QGS EDV:  97 ml QGS ESV:  49 ml  Impression Exercise Capacity:  Lexiscan with no exercise. BP Response:  Normal blood pressure response. Clinical Symptoms:  There is dyspnea. ECG Impression:  No significant ST segment change suggestive of ischemia. Comparison with Prior Nuclear Study: No images to compare  Overall Impression:  Low risk stress nuclear study with a small, severe intensity, reversible distal inferior and apical defect consistent with mild ischemia.  LV Ejection Fraction: 50%.  LV Wall Motion:  NL LV Function; NL Wall Motion   [name]

## 2014-03-06 ENCOUNTER — Encounter (HOSPITAL_COMMUNITY): Payer: Self-pay

## 2014-03-06 ENCOUNTER — Encounter (HOSPITAL_COMMUNITY)
Admission: RE | Admit: 2014-03-06 | Discharge: 2014-03-06 | Disposition: A | Discharge status: Home / Self Care | Payer: Commercial Managed Care - PPO | Source: Ambulatory Visit | Attending: Orthopedic Surgery | Admitting: Orthopedic Surgery

## 2014-03-06 DIAGNOSIS — N189 Chronic kidney disease, unspecified: Secondary | ICD-10-CM | POA: Insufficient documentation

## 2014-03-06 DIAGNOSIS — E785 Hyperlipidemia, unspecified: Secondary | ICD-10-CM | POA: Diagnosis not present

## 2014-03-06 DIAGNOSIS — I5032 Chronic diastolic (congestive) heart failure: Secondary | ICD-10-CM | POA: Insufficient documentation

## 2014-03-06 DIAGNOSIS — I872 Venous insufficiency (chronic) (peripheral): Secondary | ICD-10-CM | POA: Insufficient documentation

## 2014-03-06 DIAGNOSIS — I129 Hypertensive chronic kidney disease with stage 1 through stage 4 chronic kidney disease, or unspecified chronic kidney disease: Secondary | ICD-10-CM | POA: Diagnosis not present

## 2014-03-06 DIAGNOSIS — R0602 Shortness of breath: Secondary | ICD-10-CM | POA: Diagnosis not present

## 2014-03-06 DIAGNOSIS — Z6841 Body Mass Index (BMI) 40.0 and over, adult: Secondary | ICD-10-CM | POA: Insufficient documentation

## 2014-03-06 DIAGNOSIS — M75101 Unspecified rotator cuff tear or rupture of right shoulder, not specified as traumatic: Secondary | ICD-10-CM | POA: Insufficient documentation

## 2014-03-06 DIAGNOSIS — G629 Polyneuropathy, unspecified: Secondary | ICD-10-CM | POA: Insufficient documentation

## 2014-03-06 DIAGNOSIS — I2510 Atherosclerotic heart disease of native coronary artery without angina pectoris: Secondary | ICD-10-CM | POA: Diagnosis not present

## 2014-03-06 DIAGNOSIS — E119 Type 2 diabetes mellitus without complications: Secondary | ICD-10-CM | POA: Diagnosis not present

## 2014-03-06 DIAGNOSIS — E039 Hypothyroidism, unspecified: Secondary | ICD-10-CM | POA: Diagnosis not present

## 2014-03-06 DIAGNOSIS — R000 Tachycardia, unspecified: Secondary | ICD-10-CM | POA: Insufficient documentation

## 2014-03-06 DIAGNOSIS — Z794 Long term (current) use of insulin: Secondary | ICD-10-CM | POA: Diagnosis not present

## 2014-03-06 DIAGNOSIS — M1990 Unspecified osteoarthritis, unspecified site: Secondary | ICD-10-CM | POA: Insufficient documentation

## 2014-03-06 DIAGNOSIS — I4510 Unspecified right bundle-branch block: Secondary | ICD-10-CM | POA: Insufficient documentation

## 2014-03-06 DIAGNOSIS — I493 Ventricular premature depolarization: Secondary | ICD-10-CM | POA: Diagnosis not present

## 2014-03-06 DIAGNOSIS — F419 Anxiety disorder, unspecified: Secondary | ICD-10-CM | POA: Diagnosis not present

## 2014-03-06 DIAGNOSIS — I252 Old myocardial infarction: Secondary | ICD-10-CM | POA: Insufficient documentation

## 2014-03-06 DIAGNOSIS — Z0000 Encounter for general adult medical examination without abnormal findings: Secondary | ICD-10-CM | POA: Insufficient documentation

## 2014-03-06 DIAGNOSIS — Z87891 Personal history of nicotine dependence: Secondary | ICD-10-CM | POA: Diagnosis not present

## 2014-03-06 DIAGNOSIS — F329 Major depressive disorder, single episode, unspecified: Secondary | ICD-10-CM | POA: Insufficient documentation

## 2014-03-06 LAB — CBC
HCT: 38.2 % (ref 36.0–46.0)
Hemoglobin: 12.4 g/dL (ref 12.0–15.0)
MCH: 26 pg (ref 26.0–34.0)
MCHC: 32.5 g/dL (ref 30.0–36.0)
MCV: 80.1 fL (ref 78.0–100.0)
Platelets: 278 10*3/uL (ref 150–400)
RBC: 4.77 MIL/uL (ref 3.87–5.11)
RDW: 15.9 % — ABNORMAL HIGH (ref 11.5–15.5)
WBC: 9.5 10*3/uL (ref 4.0–10.5)

## 2014-03-06 LAB — BASIC METABOLIC PANEL
Anion gap: 17 — ABNORMAL HIGH (ref 5–15)
BUN: 36 mg/dL — ABNORMAL HIGH (ref 6–23)
CO2: 19 mEq/L (ref 19–32)
Calcium: 9.6 mg/dL (ref 8.4–10.5)
Chloride: 102 mEq/L (ref 96–112)
Creatinine, Ser: 1.24 mg/dL — ABNORMAL HIGH (ref 0.50–1.10)
GFR calc Af Amer: 52 mL/min — ABNORMAL LOW (ref >90)
GFR calc non Af Amer: 45 mL/min — ABNORMAL LOW (ref >90)
Glucose, Bld: 106 mg/dL — ABNORMAL HIGH (ref 70–99)
Potassium: 5 mEq/L (ref 3.7–5.3)
Sodium: 138 mEq/L (ref 137–147)

## 2014-03-06 LAB — SURGICAL PCR SCREEN
MRSA, PCR: POSITIVE — AB
Staphylococcus aureus: POSITIVE — AB

## 2014-03-06 NOTE — Progress Notes (Signed)
Pt. Stating she is having a lexican test on 03/07/14 at Dr. Kae Heller office.  Pt. Stating her blood sugar's are running in am 300;s  and evening from 400-500. Allison zeleneck,PA notified. Instructed pt. If sugars continue in to run in same range this week to contact Dr. Criss Rosales.

## 2014-03-06 NOTE — Progress Notes (Signed)
03/06/14 1223  OBSTRUCTIVE SLEEP APNEA  Have you ever been diagnosed with sleep apnea through a sleep study? No  Do you snore loudly (loud enough to be heard through closed doors)?  0  Do you often feel tired, fatigued, or sleepy during the daytime? 1  Has anyone observed you stop breathing during your sleep? 0  Do you have, or are you being treated for high blood pressure? 1  BMI more than 35 kg/m2? 1  Age over 64 years old? 1  Neck circumference greater than 40 cm/16 inches? 0  Gender: 0  Obstructive Sleep Apnea Score 4  Score 4 or greater  Results sent to PCP

## 2014-03-06 NOTE — Pre-Procedure Instructions (Signed)
Susan [name]  03/06/2014   Your procedure is scheduled on:  Tuesday, October 20  Report to [facility] Main Entrance "A" at 11:25 AM.  Call this number if you have problems the morning of surgery: [phone]   Remember:   Do not eat food or drink liquids after midnight.   Take these medicines the morning of surgery with A SIP OF WATER: inhaler (bring to hospital), paxil, carvedilol, isosorbide, clonazepam, , nitroglycerine as needed, levaquin, levothyroxine.               Take all meds as ordered until the day of surgery except as instructed below or per doctor:             Stop all herbal meds, nsaids (aleve,naproxen, advil, ibuprofen) vitamins, aspirin, and fish oil on 03/09/04. Stop plavix per Dr. Marlou Sa.(5 days before surgery)   Do not wear jewelry, make-up or nail polish.  Do not wear lotions, powders, or perfumes. You may wear deodorant.  Do not shave 48 hours prior to surgery. Men may shave face and neck.  Do not bring valuables to the hospital.  [facility] is not responsible                  for any belongings or valuables.               Contacts, dentures or bridgework may not be worn into surgery.  Leave suitcase in the car. After surgery it may be brought to your room.  For patients admitted to the hospital, discharge time is determined by your                treatment team.               Patients discharged the day of surgery will not be allowed to drive  home.  Name and phone number of your driver:   Special Instructions: review preparing for surgery handout   Please read over the following fact sheets that you were given: Pain Booklet, Coughing and Deep Breathing, MRSA Information and Surgical Site Infection Prevention

## 2014-03-07 ENCOUNTER — Ambulatory Visit (HOSPITAL_COMMUNITY): Payer: Commercial Managed Care - PPO

## 2014-03-07 DIAGNOSIS — R0989 Other specified symptoms and signs involving the circulatory and respiratory systems: Secondary | ICD-10-CM

## 2014-03-07 MED ORDER — TECHNETIUM TC 99M SESTAMIBI GENERIC - CARDIOLITE
33.0000 | Freq: Once | INTRAVENOUS | Status: AC | PRN
Start: 2014-03-07 — End: 2014-03-07
  Administered 2014-03-07: 33 via INTRAVENOUS

## 2014-03-07 NOTE — Progress Notes (Addendum)
Anesthesia Chart Review: Patient is a 63 year old female scheduled for right shoulder diagnostic operative arthroscopy, biceps release, open subscapularis repair, open supraspinatus repair by Dr. Marlou Sa on 03/14/14. Surgery was initially scheduled for 02/07/14, but was rescheduled due to hyperglycemia (592). She had recurrent right shoulder dislocation following a fall on 10/28/13. She has since had re-evaluation by her endocrinologist and primary cardiologist.   History includes former smoker, CAD, inferior MI '00 and '07 s/p BMS RCA '00 and '07, tachycardia, right BBB, syncope related to hypoglycemia, chronic diastolic CHF (last hospitalization 09/2013), SOB, TIA, DM2, peripheral neuropathy, CKD, SOB, HTN, dyslipidemia, hypothyroidism, depression, anxiety, arthritis, anemia, venous insufficiency, post-operative N/V. BMI is consistent with morbid obesity. OSA screening score is 4. Dr. [name] is her endocrinologist. PCP is listed as Dena Billet, PA-C. Cardiologist is Dr. Ron Parker, last visit 02/17/14, who gave permission to hold Plavix five days prior to surgery.  [facility]. Her last appointment was with Junie Bame, NP on 10/28/13 with overall improved volume status. Cardiologist Dr. Haroldine Laws did clear her for surgery from a cardiac standpoint with low to moderate risk without need for further preoperative cardiac testing. However, Dr. Ron Parker has since recommended a stress nuclear scan and felt that "If there is no significant ischemia she can be cleared from the cardiac viewpoint."  EKG on 09/21/13 showed: SR, occasional PVC, low voltage QRS, right BBB, inferior infarct (age undetermined).   Nuclear stress test on 03/07/14 showed: Overall Impression: Low risk stress nuclear study with a small, severe intensity, reversible distal inferior and apical defect consistent with mild ischemia. LV Ejection Fraction: 50%. LV Wall Motion: NL LV Function; NL Wall Motion.   [place] 09/22/13:  RA = 4  RV =  30/5/7  PA = 25/10 (16)  PCW = 7  Fick cardiac output/index = 6.3/2.7  PVR = 1.5 WU  FA sat = 93%  PA sat = 61%, 66%   Echo on 09/21/13 showed:  - Left ventricle: The cavity size was normal. Systolic function was normal. The estimated ejection fraction was in the range of 55% to 60%. Wall motion was normal; there were no regional wall motion abnormalities. - Left atrium: The atrium was moderately dilated. - Pericardium, extracardiac: A trivial pericardial effusion was identified.   LHC on 01/05/06 showed: Coronaries of the left main was normal. The LAD was small and did not wrap the apex. There was mid-30% stenosis. There was apical 25% stenosis. First diagonal was large with mid 25-30% stenosis. The circumflex and the AV groove was normal. There was a mid-obtuse marginal which was large and branching which had proximal 30% stenosis. Posterolateral was small and normal. The right coronary artery was dominant. There was proximal long 25% stenosis. There were two stents in the mid-segment. The first stent had long diffuse 25-30% stenosis. The second stent had in-stent mild luminal irregularities. The remainder of the vessel was free of high-grade disease including moderate-sized PDA.  LEFT VENTRICULOGRAM: The left ventriculogram was obtained in the RAO projection. The EF was 60% with normal wall motion. Occlusion residual nonobstructive coronary artery disease, well-preserved ejection fraction.   Carotid duplex on 04/27/12 showed: Mild atherosclerotic disease in the carotid arteries bilaterally. Estimated degree of stenosis in the internal carotid arteries is less than 50% bilaterally.   CXR on 02/01/14 showed: 1. No acute cardiopulmonary disease. 2. Improved aeration of lungs with persistent findings of chronic bronchitic change and bilateral infrahilar atelectasis.  Preoperative labs noted. BUN/CR 36/1.24,  which appears back to her baseline.  Glucose 106, although she still reported issues with  hyperglycemia but Dr. [name] adjusted her insulin regimen just last week. H/H 12.4/38.2.    Awaiting Dr. Ron Parker' input following today's stress test.  There was mild ischemia, but test was still felt low risk.  If she is cleared and glucose result on the day of surgery is reasonable then I would anticipate that she could proceed as planned.  [name] Short Stay Center/Anesthesiology Phone [phone] 03/07/2014 5:24 PM  Addendum: Dr. Ron Parker has reviewed yesterday's stress test results.  Since test was felt low risk he cleared patient for shoulder surgery from a cardiac standpoint.  [name] Short Stay Center/Anesthesiology Phone [phone] 03/08/2014 12:49 PM

## 2014-03-08 ENCOUNTER — Other Ambulatory Visit (HOSPITAL_COMMUNITY): Payer: Self-pay | Admitting: Orthopedic Surgery

## 2014-03-08 ENCOUNTER — Encounter: Payer: Self-pay | Admitting: Cardiology

## 2014-03-08 NOTE — Progress Notes (Signed)
Patient ID: Susan [name], female   DOB: Feb 18, 1951, 63 y.o.   MRN: [id number]  I saw the patient in the office February 17, 2014. I was evaluating her for preop clearance for possible shoulder surgery. She has known coronary disease and had not had any type of exercise testing for many years. Stress nuclear scan was done October, 2015. The EF is 50%. There is a small area of ischemia at the apical inferior area.  This is a low risk scan.therefore the patient can be cleared for any possible shoulder surgery.  The patient is cleared from the cardiac viewpoint for possible shoulder surgery.   Daryel November, MD

## 2014-03-13 MED ORDER — VANCOMYCIN HCL 10 G IV SOLR
1500.0000 mg | INTRAVENOUS | Status: AC
  Administered 2014-03-14: 1500 mg via INTRAVENOUS
  Filled 2014-03-13: qty 1500

## 2014-03-14 ENCOUNTER — Observation Stay (HOSPITAL_COMMUNITY)
Admission: RE | Admit: 2014-03-14 | Discharge: 2014-03-15 | Disposition: A | Discharge status: Home / Self Care | Payer: Commercial Managed Care - PPO | Source: Ambulatory Visit | Attending: Orthopedic Surgery | Admitting: Orthopedic Surgery

## 2014-03-14 ENCOUNTER — Encounter (HOSPITAL_COMMUNITY): Payer: Self-pay | Admitting: Critical Care Medicine

## 2014-03-14 ENCOUNTER — Encounter (HOSPITAL_COMMUNITY)
Admission: RE | Disposition: A | Discharge status: Home / Self Care | Payer: Self-pay | Source: Ambulatory Visit | Attending: Orthopedic Surgery

## 2014-03-14 ENCOUNTER — Ambulatory Visit (HOSPITAL_COMMUNITY): Payer: Commercial Managed Care - PPO | Admitting: Anesthesiology

## 2014-03-14 ENCOUNTER — Encounter (HOSPITAL_COMMUNITY): Payer: Commercial Managed Care - PPO | Admitting: Vascular Surgery

## 2014-03-14 DIAGNOSIS — Z7902 Long term (current) use of antithrombotics/antiplatelets: Secondary | ICD-10-CM | POA: Insufficient documentation

## 2014-03-14 DIAGNOSIS — M75101 Unspecified rotator cuff tear or rupture of right shoulder, not specified as traumatic: Secondary | ICD-10-CM | POA: Diagnosis present

## 2014-03-14 DIAGNOSIS — F329 Major depressive disorder, single episode, unspecified: Secondary | ICD-10-CM

## 2014-03-14 DIAGNOSIS — Z79899 Other long term (current) drug therapy: Secondary | ICD-10-CM | POA: Diagnosis not present

## 2014-03-14 DIAGNOSIS — F419 Anxiety disorder, unspecified: Secondary | ICD-10-CM

## 2014-03-14 DIAGNOSIS — Z792 Long term (current) use of antibiotics: Secondary | ICD-10-CM | POA: Insufficient documentation

## 2014-03-14 DIAGNOSIS — I10 Essential (primary) hypertension: Secondary | ICD-10-CM | POA: Diagnosis not present

## 2014-03-14 DIAGNOSIS — Z7982 Long term (current) use of aspirin: Secondary | ICD-10-CM | POA: Diagnosis not present

## 2014-03-14 DIAGNOSIS — Z8673 Personal history of transient ischemic attack (TIA), and cerebral infarction without residual deficits: Secondary | ICD-10-CM | POA: Insufficient documentation

## 2014-03-14 DIAGNOSIS — Z6841 Body Mass Index (BMI) 40.0 and over, adult: Secondary | ICD-10-CM | POA: Diagnosis not present

## 2014-03-14 DIAGNOSIS — F32A Depression, unspecified: Secondary | ICD-10-CM

## 2014-03-14 DIAGNOSIS — M75100 Unspecified rotator cuff tear or rupture of unspecified shoulder, not specified as traumatic: Secondary | ICD-10-CM | POA: Diagnosis present

## 2014-03-14 HISTORY — PX: SHOULDER ARTHROSCOPY WITH OPEN ROTATOR CUFF REPAIR: SHX6092

## 2014-03-14 HISTORY — DX: Headache: R51

## 2014-03-14 HISTORY — DX: Shortness of breath: R06.02

## 2014-03-14 HISTORY — DX: Headache, unspecified: R51.9

## 2014-03-14 HISTORY — DX: Angina pectoris, unspecified: I20.9

## 2014-03-14 LAB — GLUCOSE, CAPILLARY
Glucose-Capillary: 199 mg/dL — ABNORMAL HIGH (ref 70–99)
Glucose-Capillary: 211 mg/dL — ABNORMAL HIGH (ref 70–99)
Glucose-Capillary: 215 mg/dL — ABNORMAL HIGH (ref 70–99)
Glucose-Capillary: 220 mg/dL — ABNORMAL HIGH (ref 70–99)
Glucose-Capillary: 257 mg/dL — ABNORMAL HIGH (ref 70–99)

## 2014-03-14 SURGERY — ARTHROSCOPY, SHOULDER WITH REPAIR, ROTATOR CUFF, OPEN
Anesthesia: General | Site: Shoulder | Laterality: Right

## 2014-03-14 MED ORDER — MENTHOL 3 MG MT LOZG: 1.0000 | LOZENGE | OROMUCOSAL | Status: DC | PRN

## 2014-03-14 MED ORDER — ADULT MULTIVITAMIN W/MINERALS CH
1.0000 | ORAL_TABLET | Freq: Every day | ORAL | Status: DC
  Administered 2014-03-15: 1 via ORAL
  Filled 2014-03-14: qty 1

## 2014-03-14 MED ORDER — DIPHENHYDRAMINE HCL 50 MG/ML IJ SOLN
INTRAMUSCULAR | Status: DC | PRN
  Administered 2014-03-14: 6.25 mg via INTRAVENOUS

## 2014-03-14 MED ORDER — MIDAZOLAM HCL 2 MG/2ML IJ SOLN: 0.5000 mg | Freq: Once | INTRAMUSCULAR | Status: DC | PRN

## 2014-03-14 MED ORDER — SCOPOLAMINE 1 MG/3DAYS TD PT72
MEDICATED_PATCH | TRANSDERMAL | Status: AC
  Administered 2014-03-14: 1.5 mg via TRANSDERMAL
  Filled 2014-03-14: qty 1

## 2014-03-14 MED ORDER — CHLORHEXIDINE GLUCONATE 4 % EX LIQD
60.0000 mL | Freq: Once | CUTANEOUS | Status: DC
  Filled 2014-03-14: qty 60

## 2014-03-14 MED ORDER — CEFAZOLIN SODIUM 10 G IJ SOLR
3.0000 g | INTRAMUSCULAR | Status: AC
  Administered 2014-03-14: 3 g via INTRAVENOUS
  Filled 2014-03-14: qty 3000

## 2014-03-14 MED ORDER — NITROGLYCERIN 0.4 MG SL SUBL: 0.4000 mg | SUBLINGUAL_TABLET | SUBLINGUAL | Status: DC | PRN

## 2014-03-14 MED ORDER — ONDANSETRON HCL 4 MG/2ML IJ SOLN
INTRAMUSCULAR | Status: DC | PRN
Start: 2014-03-14 — End: 2014-03-14
  Administered 2014-03-14: 4 mg via INTRAVENOUS

## 2014-03-14 MED ORDER — MIDAZOLAM HCL 2 MG/2ML IJ SOLN
2.0000 mg | Freq: Once | INTRAMUSCULAR | Status: AC
  Administered 2014-03-14: 2 mg via INTRAVENOUS

## 2014-03-14 MED ORDER — SODIUM CHLORIDE 0.9 % IR SOLN
Status: DC | PRN
  Administered 2014-03-14 (×2): 3000 mL

## 2014-03-14 MED ORDER — SODIUM CHLORIDE 0.9 % IV SOLN: INTRAVENOUS | Status: DC

## 2014-03-14 MED ORDER — SUCCINYLCHOLINE CHLORIDE 20 MG/ML IJ SOLN
INTRAMUSCULAR | Status: DC | PRN
  Administered 2014-03-14: 100 mg via INTRAVENOUS

## 2014-03-14 MED ORDER — ISOSORBIDE DINITRATE 10 MG PO TABS
10.0000 mg | ORAL_TABLET | Freq: Two times a day (BID) | ORAL | Status: DC
  Administered 2014-03-14 – 2014-03-15 (×2): 10 mg via ORAL
  Filled 2014-03-14 (×3): qty 1

## 2014-03-14 MED ORDER — LACTATED RINGERS IV SOLN
INTRAVENOUS | Status: DC
  Administered 2014-03-14: 13:00:00 via INTRAVENOUS

## 2014-03-14 MED ORDER — OXYCODONE HCL 5 MG PO TABS
ORAL_TABLET | ORAL | Status: AC
  Filled 2014-03-14: qty 1

## 2014-03-14 MED ORDER — INSULIN ASPART 100 UNIT/ML ~~LOC~~ SOLN
0.0000 [IU] | Freq: Three times a day (TID) | SUBCUTANEOUS | Status: DC
  Administered 2014-03-15: 8 [IU] via SUBCUTANEOUS

## 2014-03-14 MED ORDER — CLONAZEPAM 1 MG PO TABS: 1.0000 mg | ORAL_TABLET | Freq: Two times a day (BID) | ORAL | Status: DC | PRN

## 2014-03-14 MED ORDER — LIDOCAINE HCL (CARDIAC) 20 MG/ML IV SOLN
INTRAVENOUS | Status: AC
  Filled 2014-03-14: qty 5

## 2014-03-14 MED ORDER — FENTANYL CITRATE 0.05 MG/ML IJ SOLN
100.0000 ug | Freq: Once | INTRAMUSCULAR | Status: AC
  Administered 2014-03-14: 100 ug via INTRAVENOUS

## 2014-03-14 MED ORDER — EPINEPHRINE HCL 1 MG/ML IJ SOLN
INTRAMUSCULAR | Status: AC
  Filled 2014-03-14: qty 1

## 2014-03-14 MED ORDER — LACTATED RINGERS IV SOLN
INTRAVENOUS | Status: DC | PRN
  Administered 2014-03-14 (×2): via INTRAVENOUS

## 2014-03-14 MED ORDER — PAROXETINE HCL ER 37.5 MG PO TB24
62.5000 mg | ORAL_TABLET | Freq: Every day | ORAL | Status: DC
  Administered 2014-03-15: 62.5 mg via ORAL
  Filled 2014-03-14: qty 1

## 2014-03-14 MED ORDER — ACETAMINOPHEN 325 MG PO TABS
650.0000 mg | ORAL_TABLET | Freq: Four times a day (QID) | ORAL | Status: DC | PRN
Start: 2014-03-14 — End: 2014-03-15

## 2014-03-14 MED ORDER — METFORMIN HCL 500 MG PO TABS
500.0000 mg | ORAL_TABLET | Freq: Two times a day (BID) | ORAL | Status: DC
  Administered 2014-03-15: 500 mg via ORAL
  Filled 2014-03-14 (×3): qty 1

## 2014-03-14 MED ORDER — ACETAMINOPHEN 650 MG RE SUPP: 650.0000 mg | Freq: Four times a day (QID) | RECTAL | Status: DC | PRN

## 2014-03-14 MED ORDER — OXYCODONE HCL 5 MG PO TABS
5.0000 mg | ORAL_TABLET | Freq: Once | ORAL | Status: AC | PRN
  Administered 2014-03-14: 5 mg via ORAL

## 2014-03-14 MED ORDER — VANCOMYCIN HCL IN DEXTROSE 1-5 GM/200ML-% IV SOLN
1000.0000 mg | Freq: Two times a day (BID) | INTRAVENOUS | Status: AC
  Administered 2014-03-15: 1000 mg via INTRAVENOUS
  Filled 2014-03-14: qty 200

## 2014-03-14 MED ORDER — HYDROMORPHONE HCL 1 MG/ML IJ SOLN: 0.2500 mg | INTRAMUSCULAR | Status: DC | PRN

## 2014-03-14 MED ORDER — ONDANSETRON HCL 4 MG PO TABS: 4.0000 mg | ORAL_TABLET | Freq: Four times a day (QID) | ORAL | Status: DC | PRN

## 2014-03-14 MED ORDER — MORPHINE SULFATE 2 MG/ML IJ SOLN: 1.0000 mg | INTRAMUSCULAR | Status: DC | PRN

## 2014-03-14 MED ORDER — METOCLOPRAMIDE HCL 5 MG/ML IJ SOLN: 5.0000 mg | Freq: Three times a day (TID) | INTRAMUSCULAR | Status: DC | PRN

## 2014-03-14 MED ORDER — PROPOFOL 10 MG/ML IV BOLUS
INTRAVENOUS | Status: DC | PRN
  Administered 2014-03-14: 200 mg via INTRAVENOUS

## 2014-03-14 MED ORDER — ONDANSETRON HCL 4 MG/2ML IJ SOLN: 4.0000 mg | Freq: Four times a day (QID) | INTRAMUSCULAR | Status: DC | PRN

## 2014-03-14 MED ORDER — FENTANYL CITRATE 0.05 MG/ML IJ SOLN
INTRAMUSCULAR | Status: AC
  Filled 2014-03-14: qty 5

## 2014-03-14 MED ORDER — MEPERIDINE HCL 25 MG/ML IJ SOLN: 6.2500 mg | INTRAMUSCULAR | Status: DC | PRN

## 2014-03-14 MED ORDER — ALBUTEROL SULFATE (2.5 MG/3ML) 0.083% IN NEBU: 2.5000 mg | INHALATION_SOLUTION | Freq: Four times a day (QID) | RESPIRATORY_TRACT | Status: DC | PRN

## 2014-03-14 MED ORDER — METOCLOPRAMIDE HCL 10 MG PO TABS: 5.0000 mg | ORAL_TABLET | Freq: Three times a day (TID) | ORAL | Status: DC | PRN

## 2014-03-14 MED ORDER — PAROXETINE HCL ER 25 MG PO TB24: 25.0000 mg | ORAL_TABLET | Freq: Every day | ORAL | Status: DC

## 2014-03-14 MED ORDER — BUPIVACAINE HCL (PF) 0.25 % IJ SOLN
INTRAMUSCULAR | Status: AC
  Filled 2014-03-14: qty 30

## 2014-03-14 MED ORDER — PHENOL 1.4 % MT LIQD: 1.0000 | OROMUCOSAL | Status: DC | PRN

## 2014-03-14 MED ORDER — ATORVASTATIN CALCIUM 80 MG PO TABS
80.0000 mg | ORAL_TABLET | Freq: Every day | ORAL | Status: DC
  Administered 2014-03-14: 80 mg via ORAL
  Filled 2014-03-14 (×2): qty 1

## 2014-03-14 MED ORDER — OXYCODONE HCL 5 MG/5ML PO SOLN: 5.0000 mg | Freq: Once | ORAL | Status: AC | PRN

## 2014-03-14 MED ORDER — SODIUM CHLORIDE 0.9 % IJ SOLN
INTRAMUSCULAR | Status: DC | PRN
  Administered 2014-03-14: 20 mL via INTRAVENOUS

## 2014-03-14 MED ORDER — CLOPIDOGREL BISULFATE 75 MG PO TABS
75.0000 mg | ORAL_TABLET | Freq: Every day | ORAL | Status: DC
  Administered 2014-03-15: 75 mg via ORAL
  Filled 2014-03-14 (×2): qty 1

## 2014-03-14 MED ORDER — NEOSTIGMINE METHYLSULFATE 10 MG/10ML IV SOLN
INTRAVENOUS | Status: DC | PRN
  Administered 2014-03-14: 4 mg via INTRAVENOUS

## 2014-03-14 MED ORDER — METOLAZONE 5 MG PO TABS
5.0000 mg | ORAL_TABLET | Freq: Every day | ORAL | Status: DC | PRN
  Filled 2014-03-14: qty 1

## 2014-03-14 MED ORDER — FENTANYL CITRATE 0.05 MG/ML IJ SOLN
INTRAMUSCULAR | Status: AC
  Administered 2014-03-14: 100 ug via INTRAVENOUS
  Filled 2014-03-14: qty 2

## 2014-03-14 MED ORDER — ROCURONIUM BROMIDE 100 MG/10ML IV SOLN
INTRAVENOUS | Status: DC | PRN
  Administered 2014-03-14: 40 mg via INTRAVENOUS

## 2014-03-14 MED ORDER — MIDAZOLAM HCL 2 MG/2ML IJ SOLN
INTRAMUSCULAR | Status: AC
  Administered 2014-03-14: 2 mg via INTRAVENOUS
  Filled 2014-03-14: qty 2

## 2014-03-14 MED ORDER — PREGABALIN 75 MG PO CAPS
150.0000 mg | ORAL_CAPSULE | Freq: Every day | ORAL | Status: DC
  Administered 2014-03-14 – 2014-03-15 (×2): 150 mg via ORAL
  Filled 2014-03-14 (×2): qty 2

## 2014-03-14 MED ORDER — LOSARTAN POTASSIUM 50 MG PO TABS
50.0000 mg | ORAL_TABLET | Freq: Every day | ORAL | Status: DC
  Administered 2014-03-14 – 2014-03-15 (×2): 50 mg via ORAL
  Filled 2014-03-14 (×2): qty 1

## 2014-03-14 MED ORDER — ROCURONIUM BROMIDE 50 MG/5ML IV SOLN
INTRAVENOUS | Status: AC
  Filled 2014-03-14: qty 1

## 2014-03-14 MED ORDER — EPHEDRINE SULFATE 50 MG/ML IJ SOLN
INTRAMUSCULAR | Status: AC
Start: 2014-03-14 — End: 2014-03-14
  Filled 2014-03-14: qty 1

## 2014-03-14 MED ORDER — POTASSIUM CHLORIDE ER 20 MEQ PO TBCR: 40.0000 meq | EXTENDED_RELEASE_TABLET | Freq: Two times a day (BID) | ORAL | Status: DC

## 2014-03-14 MED ORDER — 0.9 % SODIUM CHLORIDE (POUR BTL) OPTIME
TOPICAL | Status: DC | PRN
  Administered 2014-03-14: 1000 mL

## 2014-03-14 MED ORDER — POTASSIUM CHLORIDE CRYS ER 20 MEQ PO TBCR
40.0000 meq | EXTENDED_RELEASE_TABLET | Freq: Two times a day (BID) | ORAL | Status: DC
  Administered 2014-03-14 – 2014-03-15 (×2): 40 meq via ORAL
  Filled 2014-03-14 (×3): qty 2

## 2014-03-14 MED ORDER — INSULIN ASPART PROT & ASPART (70-30 MIX) 100 UNIT/ML ~~LOC~~ SUSP
100.0000 [IU] | Freq: Every day | SUBCUTANEOUS | Status: DC
  Administered 2014-03-15: 100 [IU] via SUBCUTANEOUS
  Filled 2014-03-14: qty 10

## 2014-03-14 MED ORDER — PHENYLEPHRINE HCL 10 MG/ML IJ SOLN
10.0000 mg | INTRAVENOUS | Status: DC | PRN
  Administered 2014-03-14: 5 ug/min via INTRAVENOUS

## 2014-03-14 MED ORDER — BUPIVACAINE-EPINEPHRINE (PF) 0.5% -1:200000 IJ SOLN
INTRAMUSCULAR | Status: DC | PRN
  Administered 2014-03-14: 30 mL via PERINEURAL

## 2014-03-14 MED ORDER — OXYCODONE HCL 5 MG PO TABS
5.0000 mg | ORAL_TABLET | ORAL | Status: DC | PRN
  Administered 2014-03-14 – 2014-03-15 (×3): 10 mg via ORAL
  Filled 2014-03-14 (×3): qty 2

## 2014-03-14 MED ORDER — ARTIFICIAL TEARS OP OINT
TOPICAL_OINTMENT | OPHTHALMIC | Status: DC | PRN
  Administered 2014-03-14: 1 via OPHTHALMIC

## 2014-03-14 MED ORDER — SODIUM CHLORIDE 0.9 % IJ SOLN
INTRAMUSCULAR | Status: AC
  Filled 2014-03-14: qty 10

## 2014-03-14 MED ORDER — LIDOCAINE HCL (CARDIAC) 20 MG/ML IV SOLN
INTRAVENOUS | Status: DC | PRN
  Administered 2014-03-14: 30 mg via INTRAVENOUS

## 2014-03-14 MED ORDER — PROMETHAZINE HCL 25 MG/ML IJ SOLN: 6.2500 mg | INTRAMUSCULAR | Status: DC | PRN

## 2014-03-14 MED ORDER — GLYCOPYRROLATE 0.2 MG/ML IJ SOLN
INTRAMUSCULAR | Status: DC | PRN
  Administered 2014-03-14: 0.6 mg via INTRAVENOUS

## 2014-03-14 MED ORDER — SCOPOLAMINE 1 MG/3DAYS TD PT72
1.0000 | MEDICATED_PATCH | Freq: Once | TRANSDERMAL | Status: DC
  Administered 2014-03-14: 1.5 mg via TRANSDERMAL

## 2014-03-14 MED ORDER — LEVOTHYROXINE SODIUM 50 MCG PO TABS
50.0000 ug | ORAL_TABLET | Freq: Every day | ORAL | Status: DC
  Administered 2014-03-15: 50 ug via ORAL
  Filled 2014-03-14 (×2): qty 1

## 2014-03-14 MED ORDER — CARVEDILOL 12.5 MG PO TABS
12.5000 mg | ORAL_TABLET | Freq: Two times a day (BID) | ORAL | Status: DC
  Administered 2014-03-14 – 2014-03-15 (×2): 12.5 mg via ORAL
  Filled 2014-03-14 (×4): qty 1

## 2014-03-14 MED ORDER — PROPOFOL 10 MG/ML IV BOLUS
INTRAVENOUS | Status: AC
  Filled 2014-03-14: qty 20

## 2014-03-14 MED ORDER — PAROXETINE HCL ER 37.5 MG PO TB24: 37.5000 mg | ORAL_TABLET | Freq: Every day | ORAL | Status: DC

## 2014-03-14 MED ORDER — ASPIRIN 325 MG PO TABS
325.0000 mg | ORAL_TABLET | Freq: Every day | ORAL | Status: DC
  Administered 2014-03-14 – 2014-03-15 (×2): 325 mg via ORAL
  Filled 2014-03-14 (×2): qty 1

## 2014-03-14 MED ORDER — TORSEMIDE 20 MG PO TABS
20.0000 mg | ORAL_TABLET | Freq: Every day | ORAL | Status: DC
  Administered 2014-03-14 – 2014-03-15 (×2): 20 mg via ORAL
  Filled 2014-03-14 (×2): qty 1

## 2014-03-14 SURGICAL SUPPLY — 69 items
ANCHOR BIO SWLOCK 4.75 W/TIG (Anchor) ×2 IMPLANT
ANCHOR SUT BIO SW 4.75 W/FIB (Anchor) ×2 IMPLANT
BENZOIN TINCTURE PRP APPL 2/3 (GAUZE/BANDAGES/DRESSINGS) ×2 IMPLANT
BIT DRILL TAK (DRILL) IMPLANT
BLADE CUDA 5.5 (BLADE) IMPLANT
BLADE CUTTER GATOR 3.5 (BLADE) IMPLANT
BLADE GREAT WHITE 4.2 (BLADE) ×2 IMPLANT
BLADE SURG 11 STRL SS (BLADE) ×2 IMPLANT
BUR GATOR 2.9 (BURR) IMPLANT
BUR OVAL 6.0 (BURR) IMPLANT
CANNULA SHOULDER 7CM (CANNULA) IMPLANT
CARTRIDGE CURVETEK MED (MISCELLANEOUS) IMPLANT
CARTRIDGE CURVETEK XLRG (MISCELLANEOUS) IMPLANT
COVER SURGICAL LIGHT HANDLE (MISCELLANEOUS) ×2 IMPLANT
DRAPE INCISE IOBAN 66X45 STRL (DRAPES) ×4 IMPLANT
DRAPE STERI 35X30 U-POUCH (DRAPES) ×2 IMPLANT
DRAPE U-SHAPE 47X51 STRL (DRAPES) ×4 IMPLANT
DRILL TAK (DRILL)
DRSG MEPILEX BORDER 4X8 (GAUZE/BANDAGES/DRESSINGS) ×2 IMPLANT
DRSG PAD ABDOMINAL 8X10 ST (GAUZE/BANDAGES/DRESSINGS) ×2 IMPLANT
DURAPREP 26ML APPLICATOR (WOUND CARE) ×2 IMPLANT
ELECT MENISCUS 165MM 90D (ELECTRODE) IMPLANT
ELECT REM PT RETURN 9FT ADLT (ELECTROSURGICAL) ×2
ELECTRODE REM PT RTRN 9FT ADLT (ELECTROSURGICAL) ×1 IMPLANT
EPINEPHERINE 1:1000  IN 1 ML IMPLANT
FILTER STRAW FLUID ASPIR (MISCELLANEOUS) ×2 IMPLANT
GAUZE SPONGE 4X4 12PLY STRL (GAUZE/BANDAGES/DRESSINGS) ×2 IMPLANT
GAUZE XEROFORM 1X8 LF (GAUZE/BANDAGES/DRESSINGS) ×2 IMPLANT
GLOVE BIO SURGEON ST LM GN SZ9 (GLOVE) IMPLANT
GLOVE BIOGEL PI IND STRL 8 (GLOVE) ×2 IMPLANT
GLOVE BIOGEL PI INDICATOR 8 (GLOVE) ×2
GLOVE SURG ORTHO 8.0 STRL STRW (GLOVE) ×4 IMPLANT
GOWN STRL REUS W/ TWL LRG LVL3 (GOWN DISPOSABLE) ×4 IMPLANT
GOWN STRL REUS W/TWL LRG LVL3 (GOWN DISPOSABLE) ×4
KIT BASIN OR (CUSTOM PROCEDURE TRAY) ×2 IMPLANT
KIT ROOM TURNOVER OR (KITS) ×2 IMPLANT
MANIFOLD NEPTUNE II (INSTRUMENTS) ×2 IMPLANT
NDL SUT 6 .5 CRC .975X.05 MAYO (NEEDLE) ×1 IMPLANT
NEEDLE HYPO 25X1 1.5 SAFETY (NEEDLE) ×2 IMPLANT
NEEDLE MAYO TAPER (NEEDLE) ×1
NEEDLE SPNL 18GX3.5 QUINCKE PK (NEEDLE) ×2 IMPLANT
NS IRRIG 1000ML POUR BTL (IV SOLUTION) ×2 IMPLANT
PACK SHOULDER (CUSTOM PROCEDURE TRAY) ×2 IMPLANT
PAD ARMBOARD 7.5X6 YLW CONV (MISCELLANEOUS) ×4 IMPLANT
SET ARTHROSCOPY TUBING (MISCELLANEOUS) ×1
SET ARTHROSCOPY TUBING LN (MISCELLANEOUS) ×1 IMPLANT
SLING ARM IMMOBILIZER MED (SOFTGOODS) IMPLANT
SPEAR FASTAKII (SLEEVE) IMPLANT
SPONGE LAP 4X18 X RAY DECT (DISPOSABLE) ×2 IMPLANT
STRIP CLOSURE SKIN 1/2X4 (GAUZE/BANDAGES/DRESSINGS) ×2 IMPLANT
SUCTION FRAZIER TIP 10 FR DISP (SUCTIONS) IMPLANT
SUT ETHILON 3 0 PS 1 (SUTURE) IMPLANT
SUT FIBERWIRE 2-0 18 17.9 3/8 (SUTURE) ×2
SUT PROLENE 3 0 PS 2 (SUTURE) IMPLANT
SUT VIC AB 0 CT1 27 (SUTURE) ×1
SUT VIC AB 0 CT1 27XBRD ANBCTR (SUTURE) ×1 IMPLANT
SUT VIC AB 1 CT1 27 (SUTURE) ×1
SUT VIC AB 1 CT1 27XBRD ANBCTR (SUTURE) ×1 IMPLANT
SUT VIC AB 2-0 CT1 27 (SUTURE) ×1
SUT VIC AB 2-0 CT1 TAPERPNT 27 (SUTURE) ×1 IMPLANT
SUT VICRYL 0 UR6 27IN ABS (SUTURE) ×2 IMPLANT
SUTURE FIBERWR 2-0 18 17.9 3/8 (SUTURE) ×1 IMPLANT
SYR 20CC LL (SYRINGE) ×4 IMPLANT
SYR 3ML LL SCALE MARK (SYRINGE) IMPLANT
SYR TB 1ML LUER SLIP (SYRINGE) ×2 IMPLANT
TOWEL OR 17X24 6PK STRL BLUE (TOWEL DISPOSABLE) ×2 IMPLANT
TOWEL OR 17X26 10 PK STRL BLUE (TOWEL DISPOSABLE) ×2 IMPLANT
WAND HAND CNTRL MULTIVAC 90 (MISCELLANEOUS) ×2 IMPLANT
WATER STERILE IRR 1000ML POUR (IV SOLUTION) ×2 IMPLANT

## 2014-03-14 NOTE — Progress Notes (Signed)
Report received from Maria,RN

## 2014-03-14 NOTE — Anesthesia Preprocedure Evaluation (Addendum)
Anesthesia Evaluation  Patient identified by MRN, date of birth, ID band Patient awake    Reviewed: Allergy & Precautions, H&P , NPO status , Patient's Chart, lab work & pertinent test results, reviewed documented beta blocker date and time   History of Anesthesia Complications (+) PONV and history of anesthetic complications  Airway Mallampati: I TM Distance: >3 FB Neck ROM: Full    Dental  (+) Missing, Caps, Dental Advisory Given   Pulmonary shortness of breath, COPD COPD inhaler, former smoker (quit 1999),  breath sounds clear to auscultation        Cardiovascular hypertension, Pt. on medications and Pt. on home beta blockers - angina+ CAD, + Past MI (1999, 2007), + Cardiac Stents and + Peripheral Vascular Disease Rhythm:Regular Rate:Normal  4/15 ECHO: EF 55%, valves OK 03/09/14  Stress test: low risk by Dr. Ron Parker   Neuro/Psych TIA   GI/Hepatic negative GI ROS, Neg liver ROS,   Endo/Other  diabetes (glu 199), Insulin Dependent, Oral Hypoglycemic AgentsHypothyroidism Morbid obesity  Renal/GU Renal InsufficiencyRenal disease (creat 1.24)     Musculoskeletal  (+) Arthritis -,   Abdominal (+) + obese,   Peds  Hematology   Anesthesia Other Findings   Reproductive/Obstetrics                         Anesthesia Physical Anesthesia Plan  ASA: III  Anesthesia Plan: General   Post-op Pain Management:    Induction:   Airway Management Planned: Oral ETT  Additional Equipment:   Intra-op Plan:   Post-operative Plan: Extubation in OR  Informed Consent: I have reviewed the patients History and Physical, chart, labs and discussed the procedure including the risks, benefits and alternatives for the proposed anesthesia with the patient or authorized representative who has indicated his/her understanding and acceptance.   Dental advisory given  Plan Discussed with: CRNA and Surgeon  Anesthesia  Plan Comments: (Plan routine monitors, GETA with interscalene block for post op analgesia)        Anesthesia Quick Evaluation

## 2014-03-14 NOTE — Progress Notes (Addendum)
Patient reports feeling short of breath. Patients head of bed elevated patient reports significant  relief. Dr. Glennon Mac notified. VS monitored, No change from preop assessment. Patient is having intermittent episodes increased work of breathing.

## 2014-03-14 NOTE — Progress Notes (Signed)
Patient reports that she is no longer short of breathing. Work of breathing is at baseline intermittent episodes of increased work of breathing are no longer occuring.

## 2014-03-14 NOTE — Brief Op Note (Signed)
03/14/2014  6:17 PM  PATIENT:  Susan [name]  63 y.o. female  PRE-OPERATIVE DIAGNOSIS:  RIGHT SHOULDER ROTATOR CUFF TEAR  POST-OPERATIVE DIAGNOSIS:  RIGHT SHOULDER ROTATOR CUFF TEAR  PROCEDURE:  Procedure(s): RIGHT SHOULDER ARTHROSCOPY WITH BICEPS RELEASE, OPEN SUBSCAPULA REPAIR, .  SURGEON:  Surgeon(s): Meredith Pel, MD  ASSISTANT: s vernon pa  ANESTHESIA:   general  EBL: 25 ml    Total I/O In: 1500 [I.V.:1500] Out: -   BLOOD ADMINISTERED: none  DRAINS: none   LOCAL MEDICATIONS USED:  none  SPECIMEN:  No Specimen  COUNTS:  YES  TOURNIQUET:  * No tourniquets in log *  DICTATION: .Other Dictation: Dictation Number [id number]  PLAN OF CARE: Admit for overnight observation  PATIENT DISPOSITION:  PACU - hemodynamically stable

## 2014-03-14 NOTE — Anesthesia Procedure Notes (Addendum)
Anesthesia Regional Block:  Interscalene brachial plexus block  Pre-Anesthetic Checklist: ,, timeout performed, Correct Patient, Correct Site, Correct Laterality, Correct Procedure, Correct Position, site marked, Risks and benefits discussed,  Surgical consent,  Pre-op evaluation,  At surgeon's request and post-op pain management  Laterality: Right and Upper  Prep: chloraprep       Needles:  Injection technique: Single-shot  Needle Type: Echogenic Stimulator Needle     Needle Length: 4cm 4 cm Needle Gauge: 22 and 22 G    Additional Needles:  Procedures: nerve stimulator Interscalene brachial plexus block  Nerve Stimulator or Paresthesia:  Response: forearm twitch, 0.45 mA, 0.1 ms,   Additional Responses:   Narrative:  Start time: 03/14/2014 2:01 PM End time: 03/14/2014 2:08 PM Injection made incrementally with aspirations every 5 mL.  Performed by: Personally  Anesthesiologist: Jenita Seashore, MD  Additional Notes: Pt identified in Holding room.  Monitors applied. Working IV access confirmed. Sterile prep R neck.  #22ga PNS to forearm twitch at 0.49mA threshold.  30cc 0.5% Bupivacaine with 1:200k epi injected incrementally after negative test dose.  Patient asymptomatic, VSS, no heme aspirated, tolerated well.  Jenita Seashore, MD   Procedure Name: Intubation Date/Time: 03/14/2014 3:44 PM Performed by: Ned Grace Pre-anesthesia Checklist: Patient identified, Timeout performed, Emergency Drugs available, Suction available and Patient being monitored Patient Re-evaluated:Patient Re-evaluated prior to inductionOxygen Delivery Method: Circle system utilized Preoxygenation: Pre-oxygenation with 100% oxygen Intubation Type: IV induction and Rapid sequence Laryngoscope Size: Mac and 3 Grade View: Grade I Tube type: Oral Tube size: 7.0 mm Number of attempts: 1 Airway Equipment and Method: Stylet Placement Confirmation: breath sounds checked- equal and bilateral,   positive ETCO2 and ETT inserted through vocal cords under direct vision Secured at: 22 cm Tube secured with: Tape Dental Injury: Teeth and Oropharynx as per pre-operative assessment

## 2014-03-14 NOTE — Transfer of Care (Signed)
Immediate Anesthesia Transfer of Care Note  Patient: Susan [name]  Procedure(s) Performed: Procedure(s): RIGHT SHOULDER ARTHROSCOPY WITH BICEPS RELEASE, OPEN SUBSCAPULA REPAIR, OPEN SUPRASPINATUS REPAIR. (Right)  Patient Location: PACU  Anesthesia Type:General  Level of Consciousness: awake, alert , oriented and patient cooperative  Airway & Oxygen Therapy: Patient Spontanous Breathing and Patient connected to nasal cannula oxygen  Post-op Assessment: Report given to PACU RN and Post -op Vital signs reviewed and stable  Post vital signs: Reviewed and stable  Complications: No apparent anesthesia complications

## 2014-03-14 NOTE — H&P (Signed)
Susan [name] is an 63 y.o. female.   Chief Complaint: Right shoulder pain HPI: Susan [name] is a 64 year old patient with long history of right shoulder pain. She's failed nonoperative management. MRI scan is consistent with complete subscapularis tear as well as a supraspinatus tear. She reports pain and weakness difficulties with ADLs. She has recently acute medical problems management in terms of her blood glucose. No family history of DVT or pulmonary embolism  Past Medical History  Diagnosis Date  . Tachycardia     Sinus tachycardia  . Dyslipidemia   . Hypothyroidism   . CAD (coronary artery disease)     MI in 2000 - MI  2007 - treated bare metal stent (no nuclear since then as 9/11)  . HTN (hypertension)   . Chronic diastolic heart failure     a) ECHO (08/2013) EF 55-60% and RV function nl b) RHC (08/2013) RA 4, RV 30/5/7, PA 25/10 (16), PCWP 7, Fick CO/CI 6.3/2.7, PVR 1.5 WU, PA 61 and 66%  . RBBB (right bundle branch block)     Old  . Urinary incontinence   . Obesity   . Syncope     likely due to low blood sugar  . Carotid artery disease   . Acute MI 1999; 2007  . CHF (congestive heart failure)   . Anxiety   . Depression   . Type II diabetes mellitus   . Osteoarthritis   . Arthritis   . Peripheral neuropathy   . Venous insufficiency   . Stroke     mini strokes  . PONV (postoperative nausea and vomiting)   . Shortness of breath   . Renal insufficiency     DENIES  . Anemia     hx    Past Surgical History  Procedure Laterality Date  . Knee arthroscopy Left 10/25/2006  . Coronary angioplasty with stent placement  1999; 2007    "1 + 1"  . Abdominal hysterectomy  1980's  . Tubal ligation  1970's  . Cataract extraction, bilateral Bilateral ?2013    Family History  Problem Relation Age of Onset  . Coronary artery disease    . Heart attack Mother    Social History:  reports that she quit smoking about 16 years ago. Her smoking use included Cigarettes. She has a 96  pack-year smoking history. She has never used smokeless tobacco. She reports that she does not drink alcohol or use illicit drugs.  Allergies:  Allergies  Allergen Reactions  . Codeine Nausea And Vomiting    Medications Prior to Admission  Medication Sig Dispense Refill  . albuterol (PROVENTIL HFA;VENTOLIN HFA) 108 (90 BASE) MCG/ACT inhaler Inhale 2 puffs into the lungs every 6 (six) hours as needed for wheezing or shortness of breath.      . Ascorbic Acid (VITAMIN C PO) Take 1 tablet by mouth daily. 1000 mg      . aspirin EC 81 MG tablet Take 81 mg by mouth daily.      Marland Kitchen atorvastatin (LIPITOR) 80 MG tablet Take 1 tablet (80 mg total) by mouth daily.  90 tablet  3  . carvedilol (COREG) 12.5 MG tablet Take 12.5 mg by mouth 2 (two) times daily with a meal.      . Cholecalciferol (VITAMIN D PO) Take 1 tablet by mouth daily. 2000 mg      . clonazePAM (KLONOPIN) 1 MG tablet TAKE 1 TABLET THREE TIMES A DAY AS NEEDED FOR ANXIETY  90 tablet  2  . clopidogrel (  PLAVIX) 75 MG tablet Take 1 tablet (75 mg total) by mouth daily with breakfast.  30 tablet  6  . Cyanocobalamin (VITAMIN B12 PO) Take 1 tablet by mouth daily. 1000 mg      . fish oil-omega-3 fatty acids 1000 MG capsule Take 1 g by mouth daily.       . insulin NPH-regular Human (NOVOLIN 70/30) (70-30) 100 UNIT/ML injection Inject 30-60 Units into the skin 3 (three) times daily with meals. 75 units three times daily      . isosorbide dinitrate (ISORDIL) 10 MG tablet Take 1 tablet (10 mg total) by mouth 2 (two) times daily.  60 tablet  3  . levofloxacin (LEVAQUIN) 500 MG tablet Take 500 mg by mouth daily.      Marland Kitchen levothyroxine (SYNTHROID, LEVOTHROID) 50 MCG tablet Take 50 mcg by mouth daily before breakfast.      . losartan (COZAAR) 50 MG tablet TAKE 1 TABLET BY MOUTH DAILY  30 tablet  5  . metFORMIN (GLUCOPHAGE) 500 MG tablet Take 500 mg by mouth 2 (two) times daily.       . metolazone (ZAROXOLYN) 5 MG tablet Take 5 mg by mouth daily as needed  (fluid).      . Multiple Vitamin (MULTIVITAMIN WITH MINERALS) TABS tablet Take 1 tablet by mouth daily.      . nitroGLYCERIN (NITROSTAT) 0.4 MG SL tablet Place 1 tablet (0.4 mg total) under the tongue every 5 (five) minutes as needed for chest pain.  25 tablet  3  . PARoxetine (PAXIL-CR) 25 MG 24 hr tablet Take 1 tablet (25 mg total) by mouth daily. Taken with 37.5 for a total = 62.5mg   30 tablet  5  . PARoxetine (PAXIL-CR) 37.5 MG 24 hr tablet Take 1 tablet (37.5 mg total) by mouth daily. Taken with 25mg  for a total = 62.5 mg  30 tablet  5  . Potassium Chloride ER 20 MEQ TBCR Take 40 mEq by mouth 2 (two) times daily.  120 tablet  6  . pregabalin (LYRICA) 150 MG capsule Take 150 mg by mouth daily.       Marland Kitchen torsemide (DEMADEX) 20 MG tablet Take 80 mg in am and 40 mg in pm.  180 tablet  6    No results found for this or any previous visit (from the past 48 hour(s)). No results found.  Review of Systems  Constitutional: Negative.   HENT: Negative.   Eyes: Negative.   Respiratory: Negative.   Cardiovascular: Negative.   Gastrointestinal: Negative.   Genitourinary: Negative.   Musculoskeletal: Positive for joint pain.  Skin: Negative.   Neurological: Negative.   Endo/Heme/Allergies: Negative.   Psychiatric/Behavioral: Negative.     There were no vitals taken for this visit. Physical Exam  Constitutional: She appears well-developed.  HENT:  Head: Normocephalic.  Eyes: Pupils are equal, round, and reactive to light.  Neck: Normal range of motion.  Cardiovascular: Normal rate.   Respiratory: Effort normal.  Neurological: She is alert.  Skin: Skin is warm.  Psychiatric: She has a normal mood and affect.   examination the right shoulder demonstrates weakness to subscap and supraspinatus testing reasonable passive range of motion deltoid fires no a.c. joint tenderness to direct palpation he does get her dyskinesia with forward flexion no other masses lymphadenopathy or skin changes noted  in the right shoulder region  Assessment/Plan Impression is right shoulder rotator cuff tear supraspinatus infraspinatus and significant tendinopathy plan arthroscopy with likely biceps tendon release subscapularis repair  crispness repair we'll attempt to do this through an open deltopectoral approach in terms of the supraspinatus. If not we'll have to make an accessory incision. Her morbid obesity definitely complicates the approach and her diabetes increases risk for complication. Patient understands risk and benefits all questions answered  Latavius Capizzi SCOTT 03/14/2014, 11:04 AM

## 2014-03-14 NOTE — Anesthesia Postprocedure Evaluation (Signed)
  Anesthesia Post-op Note  Patient: Susan [name]  Procedure(s) Performed: Procedure(s): RIGHT SHOULDER ARTHROSCOPY WITH BICEPS RELEASE, OPEN SUBSCAPULA REPAIR, OPEN SUPRASPINATUS REPAIR. (Right)  Patient Location: PACU  Anesthesia Type:General and GA combined with regional for post-op pain  Level of Consciousness: awake, alert , oriented and patient cooperative  Airway and Oxygen Therapy: Patient Spontanous Breathing  Post-op Pain: none  Post-op Assessment: Post-op Vital signs reviewed, Patient's Cardiovascular Status Stable, Respiratory Function Stable, Patent Airway, No signs of Nausea or vomiting and Pain level controlled  Post-op Vital Signs: Reviewed and stable  Last Vitals:  Filed Vitals:   03/14/14 1930  BP:   Pulse:   Temp: 36.7 C  Resp:     Complications: No apparent anesthesia complications

## 2014-03-15 ENCOUNTER — Encounter (HOSPITAL_COMMUNITY): Payer: Self-pay | Admitting: General Practice

## 2014-03-15 DIAGNOSIS — M75101 Unspecified rotator cuff tear or rupture of right shoulder, not specified as traumatic: Secondary | ICD-10-CM | POA: Diagnosis not present

## 2014-03-15 LAB — GLUCOSE, CAPILLARY
Glucose-Capillary: 283 mg/dL — ABNORMAL HIGH (ref 70–99)
Glucose-Capillary: 68 mg/dL — ABNORMAL LOW (ref 70–99)
Glucose-Capillary: 80 mg/dL (ref 70–99)

## 2014-03-15 MED ORDER — OXYCODONE HCL 5 MG PO TABS: 5.0000 mg | ORAL_TABLET | ORAL | Status: DC | PRN

## 2014-03-15 NOTE — Progress Notes (Signed)
Hypoglycemic Event  CBG: 68  Treatment: 15 GM carbohydrate snack  Symptoms: None  Follow-up CBG: Time:1323 CBG Result:80  Possible Reasons for Event: Unknown  Comments/MD notified: yes    Cailie Bosshart L  Remember to initiate Hypoglycemia Order Set & complete

## 2014-03-15 NOTE — Progress Notes (Signed)
UR completed 

## 2014-03-15 NOTE — Progress Notes (Signed)
Spoke briefly with patient regarding home doses of insulin and diabetes.  She states that she see's endocrinologist Dr. [name] who recently changed her insulin to 70/30 75 unit tid with meals. She states that CBG's are up and down at home.  Explained importance of eating regularly and consistent amounts with this medication regimen.  She verbalized understanding.  CBG dropped to 68 mg/dL at lunch.  She recognized that this was low and the need for treatment stating if I get less than 60 mg/dL, my hair will be soaking wet.  She was eating lunch and RN states that she will recheck CBG.  No insulin given at lunch and patient plans to resume home insulin regimen at discharge.    Thanks, Adah Perl, RN, BC-ADM Inpatient Diabetes Coordinator Pager [phone]

## 2014-03-15 NOTE — Op Note (Signed)
NAMEJANAEH, [name] NO.:  [id number]  MEDICAL RECORD NO.:  [id number]  LOCATION:  [id number]                        FACILITY:  [facility]  PHYSICIAN:  Anderson Malta, M.D.    DATE OF BIRTH:  Oct 13, 1950  DATE OF PROCEDURE: DATE OF DISCHARGE:                              OPERATIVE REPORT   PREOPERATIVE DIAGNOSIS:  Right shoulder supraspinatus tear, subscapularis tear.  POSTOPERATIVE DIAGNOSIS:  Right shoulder supraspinatus tear, subscapularis tear.  PROCEDURE:  Right shoulder arthroscopy, labral debridement, release of the biceps tendon, open subscapularis repair which is about 50% and debridement of un-repairable supraspinatus tear.  SURGEON:  Anderson Malta, M.D.  ASSISTANT:  Epimenio Foot, P.A.  ANESTHESIA:  General.  INDICATIONS:  Susan [name] is a 63 year old patient with right shoulder pain presents for operative management after explanation risks and benefits. She had a fall, but she also has significant shoulder pathology with rotator cuff tearing and retraction.  PROCEDURE IN DETAIL:  The patient was brought to operating room where general endotracheal anesthesia was induced.  Preop antibiotics administered.  Time-out was called.  The patient was placed in the beach- chair position with the head in neutral position.  Right shoulder exam under anesthesia and found to have reasonable stability.  She had a very large shoulder.  Passive range of motion was maintained.  At this time, Charlie Pitter was used to cover the operative field.  After sterile prepping and draping, posterior portal was created.  Diagnostic arthroscopy was performed.  Anterior portal created under direct visualization. Diagnostic arthroscopy demonstrated a significant synovitis as well as early glenohumeral arthritis with grade 2-3 changes on 50% of the humeral head.  The labrum itself was intact, but the rotator cuff tear both the subscapularis and supraspinatus were identified.   The supraspinatus tear was attempted to mobilize it and it would move very little.  This area was extensively debrided.  Labrum was extensively debrided with the ArthroCare.  Following biceps tendon release, posterior portal was closed. Anterior approach to deltopectoral was made to the right shoulder.  Skin and subcu tissue was sharply divided. Cephalic vein mobilized medially.  Kolbel retractor placed. Supraspinatus subscapularis tear was identified and it had also retracted significant amount.  With care being taken to avoid injury to the axillary nerve, attempts to mobilize the rotator cuff tea were performed.  It was very immobile.  I was able to mobilize it about a centimeter and a half which allowed reattachment of the inferior 50% of the subscapularis to the anatomic neck region.  This was done with Arthrex fiber tape and corkscrews.  After this was done, a reasonable repair was made. Tissue quality, however, was very poor.  Joint was thoroughly irrigated.  It should be noted that the CA ligament was preserved because of potential for anterior superior instability. Biceps tendon was tenodesed and the soft tissue fashioned in order to avoid any inter canal hardware for potential later reversed total shoulder replacement. Thorough irrigation performed deltopectoral interval closed using 0 Vicryl suture followed by interrupted inverted 2-0 Vicryl suture running and 3-0 pullout Prolene.  Susan [name]'s assistance required during the case for retraction and mobilization of tissues.  The patient  was transferred to the recovery room in stable condition.     Anderson Malta, M.D.     GSD/MEDQ  D:  03/14/2014  T:  03/15/2014  Job:  [id number]

## 2014-03-15 NOTE — Evaluation (Signed)
Occupational Therapy Evaluation Patient Details Name: Susan [name] MRN: [id number] DOB: March 03, 1951 Today's Date: 03/15/2014    History of Present Illness s/p Rt shoulder arthroscopy with open rotator cuff repair   Clinical Impression   Pt admitted with the above diagnoses and presents with below problem list. Pt will benefit from continued acute OT to address the below listed deficits and maximize independence with basic ADLs prior to d/c home with family. PTA pt was independent with ADLs. Pt at min guard-min A for ADLs. ADL and sling education provided. Exercises of wrist and hand completed as detailed below. Recommending HHOT.     Follow Up Recommendations  Home health OT;Supervision/Assistance - 24 hour    Equipment Recommendations       Recommendations for Other Services       Precautions / Restrictions Precautions Precautions: Shoulder Type of Shoulder Precautions: no ROM of Rt shoulder, NWB RUE Shoulder Interventions: Shoulder sling/immobilizer;Off for dressing/bathing/exercises Precaution Comments: reviewed precautions Required Braces or Orthoses: Sling Restrictions Weight Bearing Restrictions: Yes RUE Weight Bearing: Non weight bearing      Mobility Bed Mobility Overal bed mobility: Modified Independent             General bed mobility comments: extra time and effort, HOB elevated  Transfers Overall transfer level: Needs assistance   Transfers: Sit to/from Stand Sit to Stand: Min guard         General transfer comment: close min guard for balance post op    Balance                                            ADL Overall ADL's : Needs assistance/impaired Eating/Feeding: Set up;Sitting   Grooming: Set up;Sitting;Standing   Upper Body Bathing: Min guard;Sitting   Lower Body Bathing: Min guard;Sit to/from stand   Upper Body Dressing : Sitting;Minimal assistance   Lower Body Dressing: Min guard;Sit to/from stand   Toilet  Transfer: Min guard;Minimal assistance;Ambulation;Comfort height toilet;Grab bars Toilet Transfer Details (indicate cue type and reason): 1 person hand held assist for balance, suspect dur to meds Toileting- Clothing Manipulation and Hygiene: Min guard;Sit to/from stand   Tub/ Shower Transfer: Min guard;Ambulation;Shower seat;Grab bars   Functional mobility during ADLs: Min guard;Minimal assistance General ADL Comments: Pt ambulated in room with at min guard to min A level for balance. ADL and sling education provided. Dicussed safety with home setup.     Vision                     Perception     Praxis      Pertinent Vitals/Pain Pain Assessment: 0-10 Pain Score: 6  Pain Location: Rt shoulder Pain Descriptors / Indicators: Aching Pain Intervention(s): Limited activity within patient's tolerance;Monitored during session;Ice applied;Repositioned     Hand Dominance Right   Extremity/Trunk Assessment Upper Extremity Assessment Upper Extremity Assessment: RUE deficits/detail RUE Deficits / Details: s/p rt shoulder arthoscopy with rotator cuff repiar RUE: Unable to fully assess due to immobilization   Lower Extremity Assessment Lower Extremity Assessment: Overall WFL for tasks assessed       Communication Communication Communication: No difficulties   Cognition Arousal/Alertness: Awake/alert;Lethargic;Suspect due to medications Behavior During Therapy: [name] for tasks assessed/performed Overall Cognitive Status: Within Functional Limits for tasks assessed  General Comments       Exercises Exercises: Hand exercises     Shoulder Instructions      Home Living Family/patient expects to be discharged to:: Private residence Living Arrangements: Spouse/significant other Available Help at Discharge: Available 24 hours/day;Family;Friend(s) Type of Home: House Home Access: Level entry;Ramped entrance     Home Layout: One level      Bathroom Shower/Tub: Occupational psychologist: Handicapped height     Home Equipment: Grab bars - tub/shower          Prior Functioning/Environment Level of Independence: Independent             OT Diagnosis: Acute pain   OT Problem List: Decreased knowledge of use of DME or AE;Decreased knowledge of precautions;Impaired UE functional use;Pain   OT Treatment/Interventions: Self-care/ADL training;DME and/or AE instruction;Therapeutic activities;Therapeutic exercise;Patient/family education    OT Goals(Current goals can be found in the care plan section) Acute Rehab OT Goals OT Goal Formulation: With patient Time For Goal Achievement: 03/22/14 Potential to Achieve Goals: Good ADL Goals Pt Will Perform Upper Body Dressing: with supervision;sitting Pt/caregiver will Perform Home Exercise Program: Right Upper extremity;With Supervision;With written HEP provided  OT Frequency: Min 2X/week   Barriers to D/C:            Co-evaluation              End of Session Equipment Utilized During Treatment: Other (comment) (sling)  Activity Tolerance: Patient limited by pain;Patient limited by lethargy;Patient tolerated treatment well Patient left: in bed;with call bell/phone within reach;with nursing/sitter in room   Time: 1028-1103 OT Time Calculation (min): 35 min Charges:  OT General Charges $OT Visit: 1 Procedure OT Evaluation $Initial OT Evaluation Tier I: 1 Procedure OT Treatments $Self Care/Home Management : 8-22 mins $Therapeutic Exercise: 8-22 mins G-Codes: OT G-codes **NOT FOR INPATIENT CLASS** Functional Assessment Tool Used: clinical judgement Functional Limitation: Self care Self Care Current Status [id number]): At least 1 percent but less than 20 percent impaired, limited or restricted Self Care Goal Status [id number]): At least 1 percent but less than 20 percent impaired, limited or restricted Self Care Discharge Status [phone]): At least 1 percent but  less than 20 percent impaired, limited or restricted  Susan [name] 03/15/2014, 11:23 AM

## 2014-03-15 NOTE — Progress Notes (Signed)
Subjective: Pt stable - pain ok   Objective: Vital signs in last 24 hours: Temp:  [97.2 F (36.2 C)-98 F (36.7 C)] 97.9 F (36.6 C) (10/21 0535) Pulse Rate:  [84-98] 92 (10/21 0535) Resp:  [9-20] 18 (10/21 0535) BP: (114-182)/(42-92) 114/42 mmHg (10/21 0535) SpO2:  [92 %-100 %] 94 % (10/21 0535) Weight:  [123.197 kg (271 lb 9.6 oz)] 123.197 kg (271 lb 9.6 oz) (10/20 1118)  Intake/Output from previous day: 10/20 0701 - 10/21 0700 In: 1500 [I.V.:1500] Out: -  Intake/Output this shift:    Exam:  No cellulitis present  Labs: No results found for this basename: HGB,  in the last 72 hours No results found for this basename: WBC, RBC, HCT, PLT,  in the last 72 hours No results found for this basename: NA, K, CL, CO2, BUN, CREATININE, GLUCOSE, CALCIUM,  in the last 72 hours No results found for this basename: LABPT, INR,  in the last 72 hours  Assessment/Plan: Plan dc today - sling education only today - will try to keep shoulder immobilized for  2 weeks   Any Mcneice SCOTT 03/15/2014, 6:42 AM

## 2014-03-16 ENCOUNTER — Encounter (HOSPITAL_BASED_OUTPATIENT_CLINIC_OR_DEPARTMENT_OTHER): Payer: Commercial Managed Care - PPO | Attending: Internal Medicine

## 2014-03-16 DIAGNOSIS — I87331 Chronic venous hypertension (idiopathic) with ulcer and inflammation of right lower extremity: Secondary | ICD-10-CM | POA: Insufficient documentation

## 2014-03-16 DIAGNOSIS — L97919 Non-pressure chronic ulcer of unspecified part of right lower leg with unspecified severity: Secondary | ICD-10-CM | POA: Insufficient documentation

## 2014-03-22 DIAGNOSIS — L97919 Non-pressure chronic ulcer of unspecified part of right lower leg with unspecified severity: Secondary | ICD-10-CM | POA: Diagnosis not present

## 2014-03-22 DIAGNOSIS — I87331 Chronic venous hypertension (idiopathic) with ulcer and inflammation of right lower extremity: Secondary | ICD-10-CM | POA: Diagnosis not present

## 2014-03-23 NOTE — Progress Notes (Signed)
[facility]  NAME:  Susan [name], Susan [name]                ACCOUNT NO.:  [id number]  MEDICAL RECORD NO.:  [id number]      DATE OF BIRTH:  [birth year]  PHYSICIAN:  Judene Companion, M.D.      VISIT DATE:  03/22/2014                                  OFFICE VISIT   This is a 63 year old lady, who comes here because of marked problems with varicosities and venous ulcers on her right leg.  She has got 3 fairly good size ulcers on the anterior aspect of her right leg.  They are about a centimeter and a half piece open.  I debrided them of necrotic material down to good clean fat tissue.  Her diagnoses with other problems are diabetes, obesity, hypertension, venous ulcers in the past, neuropathy, congestive heart failure, previous myocardial infarction, and hypothyroidism.  Her medicines are vitamins, aspirin, Lipitor, Coreg, Klonopin, Plavix, insulin both Novolin and isosorbide, Levaquin, levothyroxine, Cozaar, metformin, __________, Nitrostat p.r.n., Paxil, Lyrica, and Demadex.  She is markedly obese, weighing about 300 pounds.  I am going to treat her today with Unna boots and collagen, and I told her to elevate her legs, continue her medicines, and will see her back here in a week for a change of her Unna boots.     Judene Companion, M.D.     PP/MEDQ  D:  03/22/2014  T:  03/23/2014  Job:  [id number]

## 2014-03-28 NOTE — Discharge Summary (Signed)
Physician Discharge Summary  Patient ID: Susan [name] MRN: [id number] DOB/AGE: [birth year] 63 y.o.  Admit date: 03/14/2014 Discharge date: 03/15/2014  Admission Diagnoses:  Active Problems:   Rotator cuff tear   Discharge Diagnoses:  Same  Surgeries: Procedure(s): RIGHT SHOULDER ARTHROSCOPY WITH BICEPS RELEASE, OPEN SUBSCAPULA REPAIR, OPEN SUPRASPINATUS REPAIR. on 03/14/2014   Consultants:    Discharged Condition: Stable  Hospital Course: Susan [name] is an 63 y.o. female who was admitted 03/14/2014 with a chief complaint of right shoulder pain, and found to have a diagnosis of rotator cuff tear.  They were brought to the operating room on 03/14/2014 and underwent the above named procedures. She did ok with post op pain control and was transferred to home with arm in sling   Antibiotics given:  Anti-infectives    Start     Dose/Rate Route Frequency Ordered Stop   03/15/14 0300  vancomycin (VANCOCIN) IVPB 1000 mg/200 mL premix     1,000 mg200 mL/hr over 60 Minutes Intravenous Every 12 hours 03/14/14 2007 03/15/14 0422   03/14/14 1315  ceFAZolin (ANCEF) 3 g in dextrose 5 % 50 mL IVPB     3 g160 mL/hr over 30 Minutes Intravenous On call to O.R. 03/14/14 1204 03/14/14 1545   03/13/14 1400  vancomycin (VANCOCIN) 1,500 mg in sodium chloride 0.9 % 500 mL IVPB     1,500 mg250 mL/hr over 120 Minutes Intravenous 60 min pre-op 03/13/14 1400 03/14/14 1727    .  Recent vital signs:  Filed Vitals:   03/15/14 0842  BP: 118/57  Pulse: 89  Temp:   Resp:     Recent laboratory studies:  Results for orders placed or performed during the hospital encounter of 03/14/14  Glucose, capillary  Result Value Ref Range   Glucose-Capillary 199 (H) 70 - 99 mg/dL  Glucose, capillary  Result Value Ref Range   Glucose-Capillary 211 (H) 70 - 99 mg/dL  Glucose, capillary  Result Value Ref Range   Glucose-Capillary 215 (H) 70 - 99 mg/dL  Glucose, capillary  Result Value Ref Range   Glucose-Capillary 220 (H) 70 - 99 mg/dL  Glucose, capillary  Result Value Ref Range   Glucose-Capillary 257 (H) 70 - 99 mg/dL  Glucose, capillary  Result Value Ref Range   Glucose-Capillary 283 (H) 70 - 99 mg/dL  Glucose, capillary  Result Value Ref Range   Glucose-Capillary 68 (L) 70 - 99 mg/dL   Comment 1 Documented in Chart    Comment 2 Notify RN   Glucose, capillary  Result Value Ref Range   Glucose-Capillary 80 70 - 99 mg/dL   Comment 1 Documented in Chart    Comment 2 Notify RN     Discharge Medications:     Medication List    TAKE these medications        albuterol 108 (90 BASE) MCG/ACT inhaler  Commonly known as:  PROVENTIL HFA;VENTOLIN HFA  Inhale 2 puffs into the lungs every 6 (six) hours as needed for wheezing or shortness of breath.     aspirin EC 81 MG tablet  Take 81 mg by mouth daily.     atorvastatin 80 MG tablet  Commonly known as:  LIPITOR  Take 1 tablet (80 mg total) by mouth daily.     carvedilol 12.5 MG tablet  Commonly known as:  COREG  Take 12.5 mg by mouth 2 (two) times daily with a meal.     clonazePAM 1 MG tablet  Commonly known as:  KLONOPIN  Take 1  mg by mouth 3 (three) times daily as needed for anxiety.     clopidogrel 75 MG tablet  Commonly known as:  PLAVIX  Take 1 tablet (75 mg total) by mouth daily with breakfast.     fish oil-omega-3 fatty acids 1000 MG capsule  Take 1 g by mouth daily.     insulin NPH-regular Human (70-30) 100 UNIT/ML injection  Commonly known as:  NOVOLIN 70/30  Inject 30-60 Units into the skin 3 (three) times daily with meals. 75 units three times daily     isosorbide dinitrate 10 MG tablet  Commonly known as:  ISORDIL  Take 1 tablet (10 mg total) by mouth 2 (two) times daily.     levothyroxine 50 MCG tablet  Commonly known as:  SYNTHROID, LEVOTHROID  Take 50 mcg by mouth daily before breakfast.     losartan 50 MG tablet  Commonly known as:  COZAAR  Take 50 mg by mouth daily.     metFORMIN 500 MG  tablet  Commonly known as:  GLUCOPHAGE  Take 500 mg by mouth 2 (two) times daily.     metolazone 5 MG tablet  Commonly known as:  ZAROXOLYN  Take 5 mg by mouth daily as needed (fluid).     multivitamin with minerals Tabs tablet  Take 1 tablet by mouth daily.     nitroGLYCERIN 0.4 MG SL tablet  Commonly known as:  NITROSTAT  Place 1 tablet (0.4 mg total) under the tongue every 5 (five) minutes as needed for chest pain.     oxyCODONE 5 MG immediate release tablet  Commonly known as:  Oxy IR/ROXICODONE  Take 1-2 tablets (5-10 mg total) by mouth every 3 (three) hours as needed for breakthrough pain.     PARoxetine 37.5 MG 24 hr tablet  Commonly known as:  PAXIL-CR  Take 1 tablet (37.5 mg total) by mouth daily. Taken with 25mg  for a total = 62.5 mg     PARoxetine 25 MG 24 hr tablet  Commonly known as:  PAXIL-CR  Take 1 tablet (25 mg total) by mouth daily. Taken with 37.5 for a total = 62.5mg      Potassium Chloride ER 20 MEQ Tbcr  Take 40 mEq by mouth 2 (two) times daily.     pregabalin 150 MG capsule  Commonly known as:  LYRICA  Take 150 mg by mouth daily.     torsemide 20 MG tablet  Commonly known as:  DEMADEX  Take 20 mg by mouth See admin instructions. Take 80 mg in am and 40 mg in pm.     VITAMIN B12 PO  Take 1 tablet by mouth daily. 1000 mg     VITAMIN C PO  Take 1 tablet by mouth daily. 1000 mg     VITAMIN D PO  Take 1 tablet by mouth daily. 2000 mg        Diagnostic Studies: No results found.  Disposition: 01-Home or Self Care      Discharge Instructions    Call MD / Call 911    Complete by:  As directed   If you experience chest pain or shortness of breath, CALL 911 and be transported to the hospital emergency room.  If you develope a fever above 101 F, pus (white drainage) or increased drainage or redness at the wound, or calf pain, call your surgeon's office.     Constipation Prevention    Complete by:  As directed   Drink plenty of fluids.  Prune  juice may be helpful.  You may use a stool softener, such as Colace (over the counter) 100 mg twice a day.  Use MiraLax (over the counter) for constipation as needed.     Diet - low sodium heart healthy    Complete by:  As directed      Discharge instructions    Complete by:  As directed   Keep arm in sling except when showering Keep incision dry     Increase activity slowly as tolerated    Complete by:  As directed               Signed: Gumecindo Hopkin SCOTT 03/28/2014, 12:35 PM

## 2014-03-30 ENCOUNTER — Encounter: Payer: Self-pay | Admitting: Physician Assistant

## 2014-03-30 ENCOUNTER — Ambulatory Visit (INDEPENDENT_AMBULATORY_CARE_PROVIDER_SITE_OTHER): Payer: Commercial Managed Care - PPO | Admitting: Physician Assistant

## 2014-03-30 ENCOUNTER — Emergency Department (HOSPITAL_COMMUNITY): Payer: Commercial Managed Care - PPO

## 2014-03-30 ENCOUNTER — Inpatient Hospital Stay (HOSPITAL_COMMUNITY)
Admission: EM | Admit: 2014-03-30 | Discharge: 2014-04-02 | DRG: 638 | Disposition: A | Discharge status: Home / Self Care | Payer: Commercial Managed Care - PPO | Attending: Internal Medicine | Admitting: Internal Medicine

## 2014-03-30 ENCOUNTER — Encounter (HOSPITAL_COMMUNITY): Payer: Self-pay | Admitting: Emergency Medicine

## 2014-03-30 VITALS — BP 130/76 | HR 84 | Temp 97.6°F | Resp 20 | Wt 253.0 lb

## 2014-03-30 DIAGNOSIS — D649 Anemia, unspecified: Secondary | ICD-10-CM | POA: Diagnosis present

## 2014-03-30 DIAGNOSIS — E039 Hypothyroidism, unspecified: Secondary | ICD-10-CM | POA: Diagnosis present

## 2014-03-30 DIAGNOSIS — Z90710 Acquired absence of both cervix and uterus: Secondary | ICD-10-CM | POA: Diagnosis not present

## 2014-03-30 DIAGNOSIS — F419 Anxiety disorder, unspecified: Secondary | ICD-10-CM | POA: Diagnosis present

## 2014-03-30 DIAGNOSIS — Z885 Allergy status to narcotic agent status: Secondary | ICD-10-CM | POA: Diagnosis not present

## 2014-03-30 DIAGNOSIS — E860 Dehydration: Secondary | ICD-10-CM | POA: Diagnosis present

## 2014-03-30 DIAGNOSIS — G629 Polyneuropathy, unspecified: Secondary | ICD-10-CM | POA: Diagnosis present

## 2014-03-30 DIAGNOSIS — M1990 Unspecified osteoarthritis, unspecified site: Secondary | ICD-10-CM | POA: Diagnosis present

## 2014-03-30 DIAGNOSIS — I872 Venous insufficiency (chronic) (peripheral): Secondary | ICD-10-CM | POA: Diagnosis present

## 2014-03-30 DIAGNOSIS — R1110 Vomiting, unspecified: Secondary | ICD-10-CM | POA: Diagnosis not present

## 2014-03-30 DIAGNOSIS — Z955 Presence of coronary angioplasty implant and graft: Secondary | ICD-10-CM | POA: Diagnosis not present

## 2014-03-30 DIAGNOSIS — Z6823 Body mass index (BMI) 23.0-23.9, adult: Secondary | ICD-10-CM

## 2014-03-30 DIAGNOSIS — E669 Obesity, unspecified: Secondary | ICD-10-CM | POA: Diagnosis present

## 2014-03-30 DIAGNOSIS — Z7982 Long term (current) use of aspirin: Secondary | ICD-10-CM

## 2014-03-30 DIAGNOSIS — I252 Old myocardial infarction: Secondary | ICD-10-CM

## 2014-03-30 DIAGNOSIS — R112 Nausea with vomiting, unspecified: Secondary | ICD-10-CM

## 2014-03-30 DIAGNOSIS — Z9842 Cataract extraction status, left eye: Secondary | ICD-10-CM

## 2014-03-30 DIAGNOSIS — R197 Diarrhea, unspecified: Secondary | ICD-10-CM

## 2014-03-30 DIAGNOSIS — F329 Major depressive disorder, single episode, unspecified: Secondary | ICD-10-CM | POA: Diagnosis present

## 2014-03-30 DIAGNOSIS — R32 Unspecified urinary incontinence: Secondary | ICD-10-CM | POA: Diagnosis present

## 2014-03-30 DIAGNOSIS — E118 Type 2 diabetes mellitus with unspecified complications: Secondary | ICD-10-CM

## 2014-03-30 DIAGNOSIS — E785 Hyperlipidemia, unspecified: Secondary | ICD-10-CM | POA: Diagnosis present

## 2014-03-30 DIAGNOSIS — N179 Acute kidney failure, unspecified: Secondary | ICD-10-CM | POA: Diagnosis present

## 2014-03-30 DIAGNOSIS — I10 Essential (primary) hypertension: Secondary | ICD-10-CM

## 2014-03-30 DIAGNOSIS — Z9841 Cataract extraction status, right eye: Secondary | ICD-10-CM | POA: Diagnosis not present

## 2014-03-30 DIAGNOSIS — M75100 Unspecified rotator cuff tear or rupture of unspecified shoulder, not specified as traumatic: Secondary | ICD-10-CM | POA: Diagnosis present

## 2014-03-30 DIAGNOSIS — I129 Hypertensive chronic kidney disease with stage 1 through stage 4 chronic kidney disease, or unspecified chronic kidney disease: Secondary | ICD-10-CM | POA: Diagnosis present

## 2014-03-30 DIAGNOSIS — M75101 Unspecified rotator cuff tear or rupture of right shoulder, not specified as traumatic: Secondary | ICD-10-CM

## 2014-03-30 DIAGNOSIS — Z8673 Personal history of transient ischemic attack (TIA), and cerebral infarction without residual deficits: Secondary | ICD-10-CM

## 2014-03-30 DIAGNOSIS — N183 Chronic kidney disease, stage 3 (moderate): Secondary | ICD-10-CM | POA: Diagnosis present

## 2014-03-30 DIAGNOSIS — E1310 Other specified diabetes mellitus with ketoacidosis without coma: Principal | ICD-10-CM | POA: Diagnosis present

## 2014-03-30 DIAGNOSIS — Z87891 Personal history of nicotine dependence: Secondary | ICD-10-CM | POA: Diagnosis not present

## 2014-03-30 DIAGNOSIS — I5023 Acute on chronic systolic (congestive) heart failure: Secondary | ICD-10-CM | POA: Diagnosis present

## 2014-03-30 DIAGNOSIS — E0810 Diabetes mellitus due to underlying condition with ketoacidosis without coma: Secondary | ICD-10-CM | POA: Diagnosis not present

## 2014-03-30 DIAGNOSIS — R739 Hyperglycemia, unspecified: Secondary | ICD-10-CM

## 2014-03-30 DIAGNOSIS — N189 Chronic kidney disease, unspecified: Secondary | ICD-10-CM | POA: Diagnosis not present

## 2014-03-30 DIAGNOSIS — I5022 Chronic systolic (congestive) heart failure: Secondary | ICD-10-CM | POA: Diagnosis present

## 2014-03-30 DIAGNOSIS — I4510 Unspecified right bundle-branch block: Secondary | ICD-10-CM | POA: Diagnosis present

## 2014-03-30 DIAGNOSIS — I5042 Chronic combined systolic (congestive) and diastolic (congestive) heart failure: Secondary | ICD-10-CM | POA: Diagnosis present

## 2014-03-30 DIAGNOSIS — I2510 Atherosclerotic heart disease of native coronary artery without angina pectoris: Secondary | ICD-10-CM | POA: Diagnosis present

## 2014-03-30 LAB — CBC WITH DIFFERENTIAL/PLATELET
Basophils Absolute: 0 10*3/uL (ref 0.0–0.1)
Basophils Relative: 0 % (ref 0–1)
Eosinophils Absolute: 0 10*3/uL (ref 0.0–0.7)
Eosinophils Relative: 1 % (ref 0–5)
HCT: 39.1 % (ref 36.0–46.0)
Hemoglobin: 12.5 g/dL (ref 12.0–15.0)
Lymphocytes Relative: 11 % — ABNORMAL LOW (ref 12–46)
Lymphs Abs: 0.7 10*3/uL (ref 0.7–4.0)
MCH: 26.2 pg (ref 26.0–34.0)
MCHC: 32 g/dL (ref 30.0–36.0)
MCV: 82 fL (ref 78.0–100.0)
Monocytes Absolute: 0.4 10*3/uL (ref 0.1–1.0)
Monocytes Relative: 6 % (ref 3–12)
Neutro Abs: 5.2 10*3/uL (ref 1.7–7.7)
Neutrophils Relative %: 82 % — ABNORMAL HIGH (ref 43–77)
Platelets: 251 10*3/uL (ref 150–400)
RBC: 4.77 MIL/uL (ref 3.87–5.11)
RDW: 15 % (ref 11.5–15.5)
WBC: 6.4 10*3/uL (ref 4.0–10.5)

## 2014-03-30 LAB — BASIC METABOLIC PANEL
Anion gap: 19 — ABNORMAL HIGH (ref 5–15)
Anion gap: 15 (ref 5–15)
Anion gap: 16 — ABNORMAL HIGH (ref 5–15)
BUN: 25 mg/dL — ABNORMAL HIGH (ref 6–23)
BUN: 23 mg/dL (ref 6–23)
BUN: 22 mg/dL (ref 6–23)
CO2: 20 mEq/L (ref 19–32)
CO2: 22 mEq/L (ref 19–32)
CO2: 22 mEq/L (ref 19–32)
Calcium: 9.2 mg/dL (ref 8.4–10.5)
Calcium: 9.1 mg/dL (ref 8.4–10.5)
Calcium: 9.3 mg/dL (ref 8.4–10.5)
Chloride: 87 mEq/L — ABNORMAL LOW (ref 96–112)
Chloride: 95 mEq/L — ABNORMAL LOW (ref 96–112)
Chloride: 96 mEq/L (ref 96–112)
Creatinine, Ser: 1.06 mg/dL (ref 0.50–1.10)
Creatinine, Ser: 1.05 mg/dL (ref 0.50–1.10)
Creatinine, Ser: 1.05 mg/dL (ref 0.50–1.10)
GFR calc Af Amer: 63 mL/min — ABNORMAL LOW (ref >90)
GFR calc Af Amer: 64 mL/min — ABNORMAL LOW (ref >90)
GFR calc Af Amer: 64 mL/min — ABNORMAL LOW (ref >90)
GFR calc non Af Amer: 54 mL/min — ABNORMAL LOW (ref >90)
GFR calc non Af Amer: 55 mL/min — ABNORMAL LOW (ref >90)
GFR calc non Af Amer: 55 mL/min — ABNORMAL LOW (ref >90)
Glucose, Bld: 930 mg/dL (ref 70–99)
Glucose, Bld: 573 mg/dL (ref 70–99)
Glucose, Bld: 271 mg/dL — ABNORMAL HIGH (ref 70–99)
Potassium: 4.4 mEq/L (ref 3.7–5.3)
Potassium: 3.6 mEq/L — ABNORMAL LOW (ref 3.7–5.3)
Potassium: 3.8 mEq/L (ref 3.7–5.3)
Sodium: 126 mEq/L — ABNORMAL LOW (ref 137–147)
Sodium: 132 mEq/L — ABNORMAL LOW (ref 137–147)
Sodium: 134 mEq/L — ABNORMAL LOW (ref 137–147)

## 2014-03-30 LAB — URINE MICROSCOPIC-ADD ON

## 2014-03-30 LAB — URINALYSIS, ROUTINE W REFLEX MICROSCOPIC
Bilirubin Urine: NEGATIVE
Glucose, UA: >1000 mg/dL — AB
Ketones, ur: 15 mg/dL — AB
Leukocytes, UA: NEGATIVE
Nitrite: NEGATIVE
Protein, ur: 30 mg/dL — AB
Specific Gravity, Urine: 1.031 — ABNORMAL HIGH (ref 1.005–1.030)
Urobilinogen, UA: 0.2 mg/dL (ref 0.0–1.0)
pH: 5.5 (ref 5.0–8.0)

## 2014-03-30 LAB — LIPASE, BLOOD: Lipase: 17 U/L (ref 11–59)

## 2014-03-30 LAB — GLUCOSE, CAPILLARY
Glucose-Capillary: 455 mg/dL — ABNORMAL HIGH (ref 70–99)
Glucose-Capillary: 277 mg/dL — ABNORMAL HIGH (ref 70–99)

## 2014-03-30 LAB — HEPATIC FUNCTION PANEL
ALT: 17 U/L (ref 0–35)
AST: 14 U/L (ref 0–37)
Albumin: 3.6 g/dL (ref 3.5–5.2)
Alkaline Phosphatase: 183 U/L — ABNORMAL HIGH (ref 39–117)
Bilirubin, Direct: <0.2 mg/dL (ref 0.0–0.3)
Total Bilirubin: 0.8 mg/dL (ref 0.3–1.2)
Total Protein: 6.9 g/dL (ref 6.0–8.3)

## 2014-03-30 LAB — CBG MONITORING, ED
Glucose-Capillary: >600 mg/dL (ref 70–99)
Glucose-Capillary: >600 mg/dL (ref 70–99)

## 2014-03-30 LAB — PRO B NATRIURETIC PEPTIDE: Pro B Natriuretic peptide (BNP): 1392 pg/mL — ABNORMAL HIGH (ref 0–125)

## 2014-03-30 LAB — GLUCOSE, FINGERSTICK (STAT): Glucose, fingerstick: >444 mg/dL — ABNORMAL HIGH (ref 70–99)

## 2014-03-30 LAB — MRSA PCR SCREENING: MRSA by PCR: NEGATIVE

## 2014-03-30 MED ORDER — PAROXETINE HCL ER 25 MG PO TB24
25.0000 mg | ORAL_TABLET | Freq: Every day | ORAL | Status: DC
  Administered 2014-03-31 – 2014-04-02 (×3): 25 mg via ORAL
  Filled 2014-03-30 (×3): qty 1

## 2014-03-30 MED ORDER — ATORVASTATIN CALCIUM 80 MG PO TABS
80.0000 mg | ORAL_TABLET | Freq: Every day | ORAL | Status: DC
  Administered 2014-03-31 – 2014-04-02 (×3): 80 mg via ORAL
  Filled 2014-03-30 (×3): qty 1

## 2014-03-30 MED ORDER — INSULIN REGULAR HUMAN 100 UNIT/ML IJ SOLN
INTRAMUSCULAR | Status: DC
  Administered 2014-03-30: 5.4 [IU]/h via INTRAVENOUS
  Filled 2014-03-30: qty 2.5

## 2014-03-30 MED ORDER — ALBUTEROL SULFATE (2.5 MG/3ML) 0.083% IN NEBU: 2.5000 mg | INHALATION_SOLUTION | Freq: Four times a day (QID) | RESPIRATORY_TRACT | Status: DC | PRN

## 2014-03-30 MED ORDER — DEXTROSE-NACL 5-0.45 % IV SOLN: INTRAVENOUS | Status: DC

## 2014-03-30 MED ORDER — OXYCODONE HCL 5 MG PO TABS
5.0000 mg | ORAL_TABLET | ORAL | Status: DC | PRN
  Administered 2014-03-30: 5 mg via ORAL
  Filled 2014-03-30: qty 1

## 2014-03-30 MED ORDER — INSULIN REGULAR HUMAN 100 UNIT/ML IJ SOLN
INTRAMUSCULAR | Status: DC
  Administered 2014-03-30: 7.9 [IU]/h via INTRAVENOUS
  Filled 2014-03-30: qty 2.5

## 2014-03-30 MED ORDER — NITROGLYCERIN 0.4 MG SL SUBL: 0.4000 mg | SUBLINGUAL_TABLET | SUBLINGUAL | Status: DC | PRN

## 2014-03-30 MED ORDER — CARVEDILOL 12.5 MG PO TABS
12.5000 mg | ORAL_TABLET | Freq: Two times a day (BID) | ORAL | Status: DC
  Administered 2014-03-31 – 2014-04-02 (×6): 12.5 mg via ORAL
  Filled 2014-03-30 (×7): qty 1

## 2014-03-30 MED ORDER — ONDANSETRON HCL 4 MG/2ML IJ SOLN: 4.0000 mg | Freq: Three times a day (TID) | INTRAMUSCULAR | Status: DC | PRN

## 2014-03-30 MED ORDER — SODIUM CHLORIDE 0.9 % IV SOLN
INTRAVENOUS | Status: DC
  Administered 2014-03-30: 19:00:00 via INTRAVENOUS

## 2014-03-30 MED ORDER — LOSARTAN POTASSIUM 50 MG PO TABS
50.0000 mg | ORAL_TABLET | Freq: Every day | ORAL | Status: DC
  Administered 2014-03-31: 50 mg via ORAL
  Filled 2014-03-30 (×2): qty 1

## 2014-03-30 MED ORDER — SODIUM CHLORIDE 0.9 % IV SOLN: INTRAVENOUS | Status: DC

## 2014-03-30 MED ORDER — ISOSORBIDE DINITRATE 10 MG PO TABS
10.0000 mg | ORAL_TABLET | Freq: Two times a day (BID) | ORAL | Status: DC
  Administered 2014-03-31 – 2014-04-02 (×5): 10 mg via ORAL
  Filled 2014-03-30 (×6): qty 1

## 2014-03-30 MED ORDER — SODIUM CHLORIDE 0.9 % IV BOLUS (SEPSIS)
500.0000 mL | Freq: Once | INTRAVENOUS | Status: AC
  Administered 2014-03-30: 500 mL via INTRAVENOUS

## 2014-03-30 MED ORDER — CLOPIDOGREL BISULFATE 75 MG PO TABS
75.0000 mg | ORAL_TABLET | Freq: Every day | ORAL | Status: DC
  Administered 2014-03-31 – 2014-04-02 (×3): 75 mg via ORAL
  Filled 2014-03-30 (×4): qty 1

## 2014-03-30 MED ORDER — ONDANSETRON HCL 4 MG/2ML IJ SOLN
4.0000 mg | Freq: Once | INTRAMUSCULAR | Status: AC
  Administered 2014-03-30: 4 mg via INTRAVENOUS
  Filled 2014-03-30: qty 2

## 2014-03-30 MED ORDER — PREGABALIN 75 MG PO CAPS
150.0000 mg | ORAL_CAPSULE | Freq: Every day | ORAL | Status: DC
  Administered 2014-03-31 – 2014-04-01 (×3): 150 mg via ORAL
  Filled 2014-03-30 (×3): qty 2

## 2014-03-30 MED ORDER — ENOXAPARIN SODIUM 40 MG/0.4ML ~~LOC~~ SOLN
40.0000 mg | SUBCUTANEOUS | Status: DC
  Administered 2014-03-30 – 2014-04-01 (×3): 40 mg via SUBCUTANEOUS
  Filled 2014-03-30 (×4): qty 0.4

## 2014-03-30 MED ORDER — SODIUM CHLORIDE 0.9 % IV SOLN
INTRAVENOUS | Status: DC
  Administered 2014-03-30: 22:00:00 via INTRAVENOUS

## 2014-03-30 MED ORDER — DEXTROSE-NACL 5-0.45 % IV SOLN
INTRAVENOUS | Status: DC
  Administered 2014-03-31: via INTRAVENOUS

## 2014-03-30 MED ORDER — DOXYCYCLINE HYCLATE 100 MG PO CAPS: 100.0000 mg | ORAL_CAPSULE | Freq: Two times a day (BID) | ORAL | Status: DC

## 2014-03-30 MED ORDER — POTASSIUM CHLORIDE 10 MEQ/100ML IV SOLN
10.0000 meq | INTRAVENOUS | Status: AC
  Administered 2014-03-30 – 2014-03-31 (×2): 10 meq via INTRAVENOUS
  Filled 2014-03-30 (×2): qty 100

## 2014-03-30 MED ORDER — PAROXETINE HCL ER 37.5 MG PO TB24
37.5000 mg | ORAL_TABLET | Freq: Every day | ORAL | Status: DC
  Administered 2014-03-31 – 2014-04-02 (×3): 37.5 mg via ORAL
  Filled 2014-03-30 (×3): qty 1

## 2014-03-30 MED ORDER — FAMOTIDINE IN NACL 20-0.9 MG/50ML-% IV SOLN
20.0000 mg | Freq: Two times a day (BID) | INTRAVENOUS | Status: DC
  Administered 2014-03-31 (×3): 20 mg via INTRAVENOUS
  Filled 2014-03-30 (×5): qty 50

## 2014-03-30 MED ORDER — DEXTROSE 50 % IV SOLN
25.0000 mL | INTRAVENOUS | Status: DC | PRN
  Administered 2014-04-01: 25 mL via INTRAVENOUS
  Filled 2014-03-30 (×2): qty 50

## 2014-03-30 MED ORDER — LEVOTHYROXINE SODIUM 50 MCG PO TABS
50.0000 ug | ORAL_TABLET | Freq: Every day | ORAL | Status: DC
  Administered 2014-03-31 – 2014-04-02 (×3): 50 ug via ORAL
  Filled 2014-03-30 (×4): qty 1

## 2014-03-30 MED ORDER — ASPIRIN EC 81 MG PO TBEC
81.0000 mg | DELAYED_RELEASE_TABLET | Freq: Every day | ORAL | Status: DC
  Administered 2014-03-31 – 2014-04-02 (×3): 81 mg via ORAL
  Filled 2014-03-30 (×3): qty 1

## 2014-03-30 MED ORDER — ALBUTEROL SULFATE HFA 108 (90 BASE) MCG/ACT IN AERS: 2.0000 | INHALATION_SPRAY | Freq: Four times a day (QID) | RESPIRATORY_TRACT | Status: DC | PRN

## 2014-03-30 NOTE — ED Provider Notes (Signed)
CSN: [id number]     Arrival date & time 03/30/14  1658 History   First MD Initiated Contact with Patient 03/30/14 1701     Chief Complaint  Patient presents with  . Hyperglycemia  . Emesis     (Consider location/radiation/quality/duration/timing/severity/associated sxs/prior Treatment) Patient is a 63 y.o. female presenting with hyperglycemia and vomiting. The history is provided by the patient and the EMS personnel.  Hyperglycemia Associated symptoms: fatigue, nausea and vomiting   Associated symptoms: no abdominal pain, no chest pain, no confusion, no dysuria and no fever   Emesis Associated symptoms: diarrhea   Associated symptoms: no abdominal pain and no headaches   patient status post rotator cuff surgical repair right shoulder on October 20. Since that time patient's been having problems with vomiting patient's had vomiting ongoing for 3 weeks now. Patient had 2 weeks of diarrhea early on but no diarrhea in the past week. Patient seen by primary care doctor today and referred in. Patient has not been able to take any of her medications. She is a diabetic. Patient does not seem to relate to being on any antinausea medicine. Patient had seen orthopedic surgery and follow-up a week ago. They seem to be concerned about the diarrhea may have been started on antibiotic for C. Difficile. Not able to confirm what antibiotic. Patient not drinking well patient normally drinks just Dr. Malachi Bonds in this which she's been doing of late. This is not diet Dr. Malachi Bonds. At the primary care's office patient's blood sugars was registering very high.  Past Medical History  Diagnosis Date  . Tachycardia     Sinus tachycardia  . Dyslipidemia   . Hypothyroidism   . CAD (coronary artery disease)     MI in 2000 - MI  2007 - treated bare metal stent (no nuclear since then as 9/11)  . HTN (hypertension)   . Chronic diastolic heart failure     a) ECHO (08/2013) EF 55-60% and RV function nl b) RHC (08/2013) RA 4,  RV 30/5/7, PA 25/10 (16), PCWP 7, Fick CO/CI 6.3/2.7, PVR 1.5 WU, PA 61 and 66%  . RBBB (right bundle branch block)     Old  . Urinary incontinence   . Obesity   . Syncope     likely due to low blood sugar  . Carotid artery disease   . Acute MI 1999; 2007  . CHF (congestive heart failure)   . Anxiety   . Depression   . Type II diabetes mellitus   . Peripheral neuropathy   . Venous insufficiency   . Stroke     mini strokes  . PONV (postoperative nausea and vomiting)   . Renal insufficiency     DENIES  . Anemia     hx  . Anginal pain   . Exertional shortness of breath   . Daily headache     "~ every other day; since I fell in June" (03/15/2014)  . Osteoarthritis   . Arthritis     "generalized" (03/15/2014)   Past Surgical History  Procedure Laterality Date  . Knee arthroscopy Left 10/25/2006  . Coronary angioplasty with stent placement  1999; 2007    "1 + 1"  . Abdominal hysterectomy  1980's  . Tubal ligation  1970's  . Cataract extraction, bilateral Bilateral ?2013  . Shoulder arthroscopy w/ rotator cuff repair Right 03/14/2014  . Shoulder arthroscopy with open rotator cuff repair Right 03/14/2014    Procedure: RIGHT SHOULDER ARTHROSCOPY WITH BICEPS RELEASE, OPEN SUBSCAPULA  REPAIR, OPEN SUPRASPINATUS REPAIR.;  Surgeon: Meredith Pel, MD;  Location: [facility];  Service: Orthopedics;  Laterality: Right;   Family History  Problem Relation Age of Onset  . Coronary artery disease    . Heart attack Mother    History  Substance Use Topics  . Smoking status: Former Smoker -- 3.00 packs/day for 32 years    Types: Cigarettes    Quit date: 10/24/1997  . Smokeless tobacco: Never Used  . Alcohol Use: Yes     Comment: "might have 2-3 daiquiris in the summer"   OB History    No data available     Review of Systems  Constitutional: Positive for fatigue. Negative for fever.  HENT: Negative for congestion.   Eyes: Negative for visual disturbance.  Cardiovascular:  Positive for leg swelling. Negative for chest pain.  Gastrointestinal: Positive for nausea, vomiting and diarrhea. Negative for abdominal pain.  Genitourinary: Negative for dysuria.  Musculoskeletal: Negative for back pain.  Skin: Positive for wound.  Neurological: Negative for headaches.  Hematological: Does not bruise/bleed easily.  Psychiatric/Behavioral: Negative for confusion.      Allergies  Codeine  Home Medications   Prior to Admission medications   Medication Sig Start Date End Date Taking? Authorizing Provider  albuterol (PROVENTIL HFA;VENTOLIN HFA) 108 (90 BASE) MCG/ACT inhaler Inhale 2 puffs into the lungs every 6 (six) hours as needed for wheezing or shortness of breath.    Historical Provider, MD  Ascorbic Acid (VITAMIN C PO) Take 1 tablet by mouth daily. 1000 mg    Historical Provider, MD  aspirin EC 81 MG tablet Take 81 mg by mouth daily.    Historical Provider, MD  atorvastatin (LIPITOR) 80 MG tablet Take 1 tablet (80 mg total) by mouth daily. 02/17/14   Carlena Bjornstad, MD  carvedilol (COREG) 12.5 MG tablet Take 12.5 mg by mouth 2 (two) times daily with a meal.    Historical Provider, MD  Cholecalciferol (VITAMIN D PO) Take 1 tablet by mouth daily. 2000 mg    Historical Provider, MD  clonazePAM (KLONOPIN) 1 MG tablet Take 1 mg by mouth 3 (three) times daily as needed for anxiety. 02/02/14   [name], PA-C  clopidogrel (PLAVIX) 75 MG tablet Take 1 tablet (75 mg total) by mouth daily with breakfast. 01/04/14   Jolaine Artist, MD  Cyanocobalamin (VITAMIN B12 PO) Take 1 tablet by mouth daily. 1000 mg    Historical Provider, MD  fish oil-omega-3 fatty acids 1000 MG capsule Take 1 g by mouth daily.     Historical Provider, MD  insulin NPH-regular Human (NOVOLIN 70/30) (70-30) 100 UNIT/ML injection Inject 30-60 Units into the skin 3 (three) times daily with meals. 75 units three times daily    Historical Provider, MD  isosorbide dinitrate (ISORDIL) 10 MG tablet Take 1  tablet (10 mg total) by mouth 2 (two) times daily. 03/01/14   Carlena Bjornstad, MD  levothyroxine (SYNTHROID, LEVOTHROID) 50 MCG tablet Take 50 mcg by mouth daily before breakfast.    Historical Provider, MD  losartan (COZAAR) 50 MG tablet Take 50 mg by mouth daily.    Historical Provider, MD  metFORMIN (GLUCOPHAGE) 500 MG tablet Take 500 mg by mouth 2 (two) times daily.     Historical Provider, MD  metolazone (ZAROXOLYN) 5 MG tablet Take 5 mg by mouth daily as needed (fluid).    Historical Provider, MD  Multiple Vitamin (MULTIVITAMIN WITH MINERALS) TABS tablet Take 1 tablet by mouth daily.  Historical Provider, MD  nitroGLYCERIN (NITROSTAT) 0.4 MG SL tablet Place 1 tablet (0.4 mg total) under the tongue every 5 (five) minutes as needed for chest pain. 05/06/13   Carlena Bjornstad, MD  oxyCODONE (OXY IR/ROXICODONE) 5 MG immediate release tablet Take 1-2 tablets (5-10 mg total) by mouth every 3 (three) hours as needed for breakthrough pain. 03/15/14   Meredith Pel, MD  PARoxetine (PAXIL-CR) 25 MG 24 hr tablet Take 1 tablet (25 mg total) by mouth daily. Taken with 37.5 for a total = 62.5mg  02/02/14   [name], PA-C  PARoxetine (PAXIL-CR) 37.5 MG 24 hr tablet Take 1 tablet (37.5 mg total) by mouth daily. Taken with 25mg  for a total = 62.5 mg 02/02/14   [name], PA-C  Potassium Chloride ER 20 MEQ TBCR Take 40 mEq by mouth 2 (two) times daily. 10/20/13   Amy D Clegg, NP  pregabalin (LYRICA) 150 MG capsule Take 150 mg by mouth daily.     Historical Provider, MD  torsemide (DEMADEX) 20 MG tablet Take 20 mg by mouth See admin instructions. Take 80 mg in am and 40 mg in pm. 10/20/13   Amy D Clegg, NP   BP 154/73 mmHg  Pulse 77  Temp(Src) 98.4 F (36.9 C) (Oral)  Resp 13  SpO2 98% Physical Exam  Constitutional: She is oriented to person, place, and time. She appears well-developed and well-nourished. No distress.  HENT:  Head: Normocephalic and atraumatic.  Because membranes very dry.  Eyes:  Conjunctivae and EOM are normal. Pupils are equal, round, and reactive to light.  Neck: Normal range of motion.  Cardiovascular: Normal rate and regular rhythm.   Pulmonary/Chest: Effort normal and breath sounds normal.  Borderline tachycardia.  Abdominal: Soft. Bowel sounds are normal. There is no tenderness.  Musculoskeletal: She exhibits edema.  Swelling to both legs. Notes chronic. Right leg with chronic wounds to skin breakdown.  Healing anterior right shoulder scar. No evidence of infection.  Neurological: She is alert and oriented to person, place, and time. No cranial nerve deficit. She exhibits normal muscle tone. Coordination normal.  Skin: Skin is warm.  Nursing note and vitals reviewed.   ED Course  Procedures (including critical care time) Labs Review Labs Reviewed  CBC WITH DIFFERENTIAL - Abnormal; Notable for the following:    Neutrophils Relative % 82 (*)    Lymphocytes Relative 11 (*)    All other components within normal limits  BASIC METABOLIC PANEL - Abnormal; Notable for the following:    Sodium 126 (*)    Chloride 87 (*)    Glucose, Bld 930 (*)    BUN 25 (*)    GFR calc non Af Amer 54 (*)    GFR calc Af Amer 63 (*)    Anion gap 19 (*)    All other components within normal limits  HEPATIC FUNCTION PANEL - Abnormal; Notable for the following:    Alkaline Phosphatase 183 (*)    All other components within normal limits  PRO B NATRIURETIC PEPTIDE - Abnormal; Notable for the following:    Pro B Natriuretic peptide (BNP) 1392.0 (*)    All other components within normal limits  CBG MONITORING, ED - Abnormal; Notable for the following:    Glucose-Capillary >600 (*)    All other components within normal limits  CLOSTRIDIUM DIFFICILE BY PCR  LIPASE, BLOOD  URINALYSIS, ROUTINE W REFLEX MICROSCOPIC   Results for orders placed or performed during the hospital encounter of  03/30/14  CBC with Differential  Result Value Ref Range   WBC 6.4 4.0 - 10.5 K/uL   RBC  4.77 3.87 - 5.11 MIL/uL   Hemoglobin 12.5 12.0 - 15.0 g/dL   HCT 39.1 36.0 - 46.0 %   MCV 82.0 78.0 - 100.0 fL   MCH 26.2 26.0 - 34.0 pg   MCHC 32.0 30.0 - 36.0 g/dL   RDW 15.0 11.5 - 15.5 %   Platelets 251 150 - 400 K/uL   Neutrophils Relative % 82 (H) 43 - 77 %   Neutro Abs 5.2 1.7 - 7.7 K/uL   Lymphocytes Relative 11 (L) 12 - 46 %   Lymphs Abs 0.7 0.7 - 4.0 K/uL   Monocytes Relative 6 3 - 12 %   Monocytes Absolute 0.4 0.1 - 1.0 K/uL   Eosinophils Relative 1 0 - 5 %   Eosinophils Absolute 0.0 0.0 - 0.7 K/uL   Basophils Relative 0 0 - 1 %   Basophils Absolute 0.0 0.0 - 0.1 K/uL  Basic metabolic panel  Result Value Ref Range   Sodium 126 (L) 137 - 147 mEq/L   Potassium 4.4 3.7 - 5.3 mEq/L   Chloride 87 (L) 96 - 112 mEq/L   CO2 20 19 - 32 mEq/L   Glucose, Bld 930 (HH) 70 - 99 mg/dL   BUN 25 (H) 6 - 23 mg/dL   Creatinine, Ser 1.06 0.50 - 1.10 mg/dL   Calcium 9.2 8.4 - 10.5 mg/dL   GFR calc non Af Amer 54 (L) >90 mL/min   GFR calc Af Amer 63 (L) >90 mL/min   Anion gap 19 (H) 5 - 15  Hepatic function panel  Result Value Ref Range   Total Protein 6.9 6.0 - 8.3 g/dL   Albumin 3.6 3.5 - 5.2 g/dL   AST 14 0 - 37 U/L   ALT 17 0 - 35 U/L   Alkaline Phosphatase 183 (H) 39 - 117 U/L   Total Bilirubin 0.8 0.3 - 1.2 mg/dL   Bilirubin, Direct <0.2 0.0 - 0.3 mg/dL   Indirect Bilirubin NOT CALCULATED 0.3 - 0.9 mg/dL  Lipase, blood  Result Value Ref Range   Lipase 17 11 - 59 U/L  Pro b natriuretic peptide (BNP)  Result Value Ref Range   Pro B Natriuretic peptide (BNP) 1392.0 (H) 0 - 125 pg/mL  POC CBG, ED  Result Value Ref Range   Glucose-Capillary >600 (HH) 70 - 99 mg/dL     Imaging Review Dg Chest Port 1 View  03/30/2014   CLINICAL DATA:  Vomiting.  EXAM: PORTABLE CHEST - 1 VIEW  COMPARISON:  02/01/2014  FINDINGS: Normal heart size and mediastinal contours. No acute infiltrate or edema. No effusion or pneumothorax. No acute osseous findings.  IMPRESSION: No active disease.    Electronically Signed   By: Jorje Guild M.D.   On: 03/30/2014 18:07     EKG Interpretation   Date/Time:  Thursday March 30 2014 17:59:28 EST Ventricular Rate:  67 PR Interval:  156 QRS Duration: 131 QT Interval:  456 QTC Calculation: 481 R Axis:   -42 Text Interpretation:  Sinus rhythm IVCD, consider atypical RBBB Borderline  ST elevation, lateral leads No significant change since last tracing  Confirmed by Cheresa Siers  MD, Selena Swaminathan [phone]) on 03/30/2014 6:09:17 PM      CRITICAL CARE Performed by: Fredia Sorrow Total critical care time: 30 Critical care time was exclusive of separately billable procedures and treating other patients. Critical care was necessary  to treat or prevent imminent or life-threatening deterioration. Critical care was time spent personally by me on the following activities: development of treatment plan with patient and/or surrogate as well as nursing, discussions with consultants, evaluation of patient's response to treatment, examination of patient, obtaining history from patient or surrogate, ordering and performing treatments and interventions, ordering and review of laboratory studies, ordering and review of radiographic studies, pulse oximetry and re-evaluation of patient's condition.   MDM   Final diagnoses:  Vomiting  Hyperglycemia   Patient with marked hyperglycemia. No evidence of significant acidosis. Patient with about 3 week history of vomiting and about a 2 week history of diarrhea no diarrhea this week. Patient status post rotator cuff surgery October 20 on the right shoulder. Upon arriving home vomiting started. Patient has not been taking her medicines. Patient seen by primary care doctor today and referred in. Sounds as if patient was may be started on a antibiotic by her orthopedic doctor which seems unusual. What and a bike that was not clear. Seems like it may have been relation to the diarrhea so it may have been something to treat C.  Difficile.  Patient clinically markedly dehydrated. Patient receiving IV fluids. Patient also started on nonglucose stabilizer to control the blood sugars. Patient basic electrolytes without significant abnormalities other than sodium being low but that would be a pseudo-hyponatremia due to the high blood sugar. Potassium is not elevated but reasonable at 4.4. Chest x-ray negative for pneumonia or pulmonary edema. Patient does have a history of chronic diastolic heart failure however. So the fluids will have to be watched carefully. Patient's BNP is elevated. But as stated chest x-ray shows no evidence of CHF right now.  Patient's urinalysis is pending.  Will contact the hospitalist for admission. Patient's primary care doctors are [facility].     Fredia Sorrow, MD 03/30/14 2048

## 2014-03-30 NOTE — ED Notes (Signed)
Pt assisted to use bedpan, clear, yellow urine obtained

## 2014-03-30 NOTE — ED Notes (Signed)
txfr from [facility] for hyperglycemia, vomiting, pain, poor po intake since rotator cuff surgery on 03/14/14

## 2014-03-30 NOTE — Progress Notes (Signed)
Patient ID: Susan [name] MRN: [id number], DOB: 15-Aug-1950, 63 y.o. Date of Encounter: @DATE @  Chief Complaint:  Chief Complaint  Patient presents with  . has not felt well since surgery Oct 20    rt shoulder repair,  has been having n/v/d,  has not been able to take any of her meds,  has not been taking her sugars, very weak and unbalanced    HPI: 63 y.o. year old white female  presents with above symptoms.   Her daughter is here with her for visit today. I did review records in Epic and I did print the discharge summary from 03/15/14. However it really does not provide any additional information other than just the orthopedic procedure performed. The surgery listed in that is" right shoulder arthroscopically with biceps release, open subscapular repair, open supraspinatus repair" Day patient's daughter states that" her tissue was very thin etc. So they were not able to do everything that needed to be done."  They also report the patient has been having swelling and open wounds in her lower legs and feet. They state that the patient was self treating these in the month of June and July. Then some time around there that Dr. Soyla Murphy realized that the wounds were present and started treating them. Patient states that she just had the first appointment at the wound center immediately after her shoulder surgery. Says that she has a second appointment there tomorrow.  Patient states that ever since October 20 which was the day of her shoulder surgery she has been in bed and has not eaten any food at all. Daughter notes the patient is drinking Dr. Malachi Bonds. Here in the office patient is drinking water repeatedly. Pt states that every time she has tried to eat any food she feels nauseous and feels like she is can have dry heaves. She states that she was having diarrhea up until Monday which was 03/27/14. Has had no further diarrhea since Monday.  Says that she is on antibiotics since the shoulder  surgery but does not recall the name of them and does not have the bottles with her.  Says that she has had one follow-up appointment with Dr. Marlou Sa and that was this past Monday. He told him about these symptoms and he mentioned a stool sample for infection in the stool--  but patient never had any follow-up regarding that.    Past Medical History  Diagnosis Date  . Tachycardia     Sinus tachycardia  . Dyslipidemia   . Hypothyroidism   . CAD (coronary artery disease)     MI in 2000 - MI  2007 - treated bare metal stent (no nuclear since then as 9/11)  . HTN (hypertension)   . Chronic diastolic heart failure     a) ECHO (08/2013) EF 55-60% and RV function nl b) RHC (08/2013) RA 4, RV 30/5/7, PA 25/10 (16), PCWP 7, Fick CO/CI 6.3/2.7, PVR 1.5 WU, PA 61 and 66%  . RBBB (right bundle branch block)     Old  . Urinary incontinence   . Obesity   . Syncope     likely due to low blood sugar  . Carotid artery disease   . Acute MI 1999; 2007  . CHF (congestive heart failure)   . Anxiety   . Depression   . Type II diabetes mellitus   . Peripheral neuropathy   . Venous insufficiency   . Stroke     mini strokes  . PONV (  postoperative nausea and vomiting)   . Renal insufficiency     DENIES  . Anemia     hx  . Anginal pain   . Exertional shortness of breath   . Daily headache     "~ every other day; since I fell in June" (03/15/2014)  . Osteoarthritis   . Arthritis     "generalized" (03/15/2014)     Home Meds: Outpatient Prescriptions Prior to Visit  Medication Sig Dispense Refill  . albuterol (PROVENTIL HFA;VENTOLIN HFA) 108 (90 BASE) MCG/ACT inhaler Inhale 2 puffs into the lungs every 6 (six) hours as needed for wheezing or shortness of breath.    . Ascorbic Acid (VITAMIN C PO) Take 1 tablet by mouth daily. 1000 mg    . aspirin EC 81 MG tablet Take 81 mg by mouth daily.    Marland Kitchen atorvastatin (LIPITOR) 80 MG tablet Take 1 tablet (80 mg total) by mouth daily. 90 tablet 3  . carvedilol  (COREG) 12.5 MG tablet Take 12.5 mg by mouth 2 (two) times daily with a meal.    . Cholecalciferol (VITAMIN D PO) Take 1 tablet by mouth daily. 2000 mg    . clonazePAM (KLONOPIN) 1 MG tablet Take 1 mg by mouth 3 (three) times daily as needed for anxiety.    . clopidogrel (PLAVIX) 75 MG tablet Take 1 tablet (75 mg total) by mouth daily with breakfast. 30 tablet 6  . Cyanocobalamin (VITAMIN B12 PO) Take 1 tablet by mouth daily. 1000 mg    . fish oil-omega-3 fatty acids 1000 MG capsule Take 1 g by mouth daily.     . insulin NPH-regular Human (NOVOLIN 70/30) (70-30) 100 UNIT/ML injection Inject 30-60 Units into the skin 3 (three) times daily with meals. 75 units three times daily    . isosorbide dinitrate (ISORDIL) 10 MG tablet Take 1 tablet (10 mg total) by mouth 2 (two) times daily. 60 tablet 3  . levothyroxine (SYNTHROID, LEVOTHROID) 50 MCG tablet Take 50 mcg by mouth daily before breakfast.    . losartan (COZAAR) 50 MG tablet Take 50 mg by mouth daily.    . metFORMIN (GLUCOPHAGE) 500 MG tablet Take 500 mg by mouth 2 (two) times daily.     . metolazone (ZAROXOLYN) 5 MG tablet Take 5 mg by mouth daily as needed (fluid).    . Multiple Vitamin (MULTIVITAMIN WITH MINERALS) TABS tablet Take 1 tablet by mouth daily.    . nitroGLYCERIN (NITROSTAT) 0.4 MG SL tablet Place 1 tablet (0.4 mg total) under the tongue every 5 (five) minutes as needed for chest pain. 25 tablet 3  . oxyCODONE (OXY IR/ROXICODONE) 5 MG immediate release tablet Take 1-2 tablets (5-10 mg total) by mouth every 3 (three) hours as needed for breakthrough pain. 60 tablet 0  . PARoxetine (PAXIL-CR) 25 MG 24 hr tablet Take 1 tablet (25 mg total) by mouth daily. Taken with 37.5 for a total = 62.5mg  30 tablet 5  . PARoxetine (PAXIL-CR) 37.5 MG 24 hr tablet Take 1 tablet (37.5 mg total) by mouth daily. Taken with 25mg  for a total = 62.5 mg 30 tablet 5  . Potassium Chloride ER 20 MEQ TBCR Take 40 mEq by mouth 2 (two) times daily. 120 tablet 6  .  pregabalin (LYRICA) 150 MG capsule Take 150 mg by mouth daily.     Marland Kitchen torsemide (DEMADEX) 20 MG tablet Take 20 mg by mouth See admin instructions. Take 80 mg in am and 40 mg in pm.  No facility-administered medications prior to visit.    Allergies:  Allergies  Allergen Reactions  . Codeine Nausea And Vomiting    History   Social History  . Marital Status: Married    Spouse Name: N/A    Number of Children: 3  . Years of Education: 12th   Occupational History  .      unemploye   Social History Main Topics  . Smoking status: Former Smoker -- 3.00 packs/day for 32 years    Types: Cigarettes    Quit date: 10/24/1997  . Smokeless tobacco: Never Used  . Alcohol Use: Yes     Comment: "might have 2-3 daiquiris in the summer"  . Drug Use: No  . Sexual Activity: No   Other Topics Concern  . Not on file   Social History Narrative   Pt lives at home with her spouse.   Caffeine Use- 3 sodas daily    Family History  Problem Relation Age of Onset  . Coronary artery disease    . Heart attack Mother      Review of Systems:  See HPI for pertinent ROS. All other ROS negative.    Physical Exam: Blood pressure 130/76, pulse 84, temperature 97.6 F (36.4 C), temperature source Oral, resp. rate 20, weight 253 lb (114.76 kg)., Body mass index is 40.85 kg/(m^2). General: Obese WF. Appears pale, clammy.  Neck: Supple. No thyromegaly. No lymphadenopathy. Lungs: Clear bilaterally to auscultation without wheezes, rales, or rhonchi. Breathing is unlabored. Heart: RRR with S1 S2. No murmurs, rubs, or gallops. Abdomen: Soft,  non-distended with normoactive bowel sounds. No hepatomegaly. No rebound/guarding. No obvious abdominal masses. Tender with palpation across upper abdomen bilaterally. Musculoskeletal:  Strength and tone normal for age. Extremities/Skin: Pale and clammy. Neuro: Alert and oriented X 3. Moves all extremities spontaneously. Gait is normal. CNII-XII grossly in  tact. Psych:  Responds to questions appropriately with a normal affect.   Results for orders placed or performed in visit on 03/30/14  Glucose, fingerstick (stat)  Result Value Ref Range   Glucose, fingerstick >444 (H) 70 - 99 mg/dL     ASSESSMENT AND PLAN:  63 y.o. year old female with  1. Type 2 diabetes mellitus with complication - Glucose, fingerstick (stat)  2. Rotator cuff tear, right  3. Nausea and vomiting, vomiting of unspecified type - Glucose, fingerstick (stat)  4. Diarrhea - Glucose, fingerstick  Glucose was "too high to detect" Pt and daughter then report that ever since she's been home from the surgery she has been using no insulin because she is not eating. Pt states that in the past when she was sick and could not eat or drink Dr. Soyla Murphy had told her to use half the dose of insulin. She said that on one occasion she did this and it made her feel even worse. Therefore she has not used any further insulin since. As well, she has not been checking her blood sugar at all ever since October 20.  Humalog 75/25 given 20 units at 3:55pm.  EMS was called and patient transported to ER. EMS transported her at 4:20 pm.    SignedOlean Ree Gramling, [place], [facility] 03/30/2014 3:36 PM

## 2014-03-30 NOTE — H&P (Signed)
[facility] History and Physical  Patient: Susan [name]  [id number]  DOB: May 17, 1951  DOS: the patient was seen and examined on 03/30/2014 PCP: [name], PA-C  Chief Complaint: nausea vomiting and diarrhea  HPI: [name] is a 63 y.o. female with Past medical history of diabetes mellitus, coronary artery disease, hypothyroidism, chronic diastolic dysfunction, RBBB, recent right shoulder arthroscopy with open muscle repair. The patient presents with complaints of nausea vomiting and diarrhea that has been ongoing since her recent surgery. She mentions that she was at her baseline when she was in the hospital after reaching home after a few days she started having initially vomiting multiple episodes on a daily basis roughly 3-4 episodes of nonbilious nonbloody vomitus. This was followed by one week of diarrhea. She continues to have some acid reflux as well. She denies any fever or chills. She has some cough but no shortness of breath or chest pain. She has seen her orthopedic doctor on Monday who prescribed her some doxycycline with which she continues to have nausea vomiting therefore she saw her PCP today. She mentions she has not been able to eat and she has not been taking her insulin or any other medication since her recent surgery.  The patient is coming from home. And at her baseline independent for most of her ADL.  Review of Systems: as mentioned in the history of present illness.  A Comprehensive review of the other systems is negative.  Past Medical History  Diagnosis Date  . Tachycardia     Sinus tachycardia  . Dyslipidemia   . Hypothyroidism   . CAD (coronary artery disease)     MI in 2000 - MI  2007 - treated bare metal stent (no nuclear since then as 9/11)  . HTN (hypertension)   . Chronic diastolic heart failure     a) ECHO (08/2013) EF 55-60% and RV function nl b) RHC (08/2013) RA 4, RV 30/5/7, PA 25/10 (16), PCWP 7, Fick CO/CI 6.3/2.7, PVR 1.5 WU, PA  61 and 66%  . RBBB (right bundle branch block)     Old  . Urinary incontinence   . Obesity   . Syncope     likely due to low blood sugar  . Carotid artery disease   . Acute MI 1999; 2007  . CHF (congestive heart failure)   . Anxiety   . Depression   . Type II diabetes mellitus   . Peripheral neuropathy   . Venous insufficiency   . Stroke     mini strokes  . PONV (postoperative nausea and vomiting)   . Renal insufficiency     DENIES  . Anemia     hx  . Anginal pain   . Exertional shortness of breath   . Daily headache     "~ every other day; since I fell in June" (03/15/2014)  . Osteoarthritis   . Arthritis     "generalized" (03/15/2014)   Past Surgical History  Procedure Laterality Date  . Knee arthroscopy Left 10/25/2006  . Coronary angioplasty with stent placement  1999; 2007    "1 + 1"  . Abdominal hysterectomy  1980's  . Tubal ligation  1970's  . Cataract extraction, bilateral Bilateral ?2013  . Shoulder arthroscopy w/ rotator cuff repair Right 03/14/2014  . Shoulder arthroscopy with open rotator cuff repair Right 03/14/2014    Procedure: RIGHT SHOULDER ARTHROSCOPY WITH BICEPS RELEASE, OPEN SUBSCAPULA REPAIR, OPEN SUPRASPINATUS REPAIR.;  Surgeon: Meredith Pel, MD;  Location: [facility]  OR;  Service: Orthopedics;  Laterality: Right;   Social History:  reports that she quit smoking about 16 years ago. Her smoking use included Cigarettes. She has a 96 pack-year smoking history. She has never used smokeless tobacco. She reports that she drinks alcohol. She reports that she does not use illicit drugs.  Allergies  Allergen Reactions  . Codeine Nausea And Vomiting    Family History  Problem Relation Age of Onset  . Coronary artery disease    . Heart attack Mother     Prior to Admission medications   Medication Sig Start Date End Date Taking? Authorizing Provider  albuterol (PROVENTIL HFA;VENTOLIN HFA) 108 (90 BASE) MCG/ACT inhaler Inhale 2 puffs into the lungs  every 6 (six) hours as needed for wheezing or shortness of breath.   Yes Historical Provider, MD  Ascorbic Acid (VITAMIN C) 1000 MG tablet Take 1,000 mg by mouth daily.   Yes Historical Provider, MD  aspirin EC 81 MG tablet Take 81 mg by mouth daily.   Yes Historical Provider, MD  atorvastatin (LIPITOR) 80 MG tablet Take 1 tablet (80 mg total) by mouth daily. 02/17/14  Yes Carlena Bjornstad, MD  carvedilol (COREG) 12.5 MG tablet Take 12.5 mg by mouth 2 (two) times daily with a meal.   Yes Historical Provider, MD  Cholecalciferol (VITAMIN D) 2000 UNITS tablet Take 2,000 Units by mouth daily.   Yes Historical Provider, MD  clopidogrel (PLAVIX) 75 MG tablet Take 1 tablet (75 mg total) by mouth daily with breakfast. 01/04/14  Yes Jolaine Artist, MD  doxycycline (VIBRAMYCIN) 100 MG capsule Take 100 mg by mouth 2 (two) times daily. 03/22/14  Yes Historical Provider, MD  fish oil-omega-3 fatty acids 1000 MG capsule Take 1 g by mouth daily.    Yes Historical Provider, MD  insulin NPH-regular Human (NOVOLIN 70/30) (70-30) 100 UNIT/ML injection Inject 70 Units into the skin 3 (three) times daily with meals. 75 units three times daily   Yes Historical Provider, MD  isosorbide dinitrate (ISORDIL) 10 MG tablet Take 1 tablet (10 mg total) by mouth 2 (two) times daily. 03/01/14  Yes Carlena Bjornstad, MD  levothyroxine (SYNTHROID, LEVOTHROID) 50 MCG tablet Take 50 mcg by mouth daily before breakfast.   Yes Historical Provider, MD  losartan (COZAAR) 50 MG tablet Take 50 mg by mouth daily.   Yes Historical Provider, MD  metFORMIN (GLUCOPHAGE) 500 MG tablet Take 500 mg by mouth 2 (two) times daily.    Yes Historical Provider, MD  metolazone (ZAROXOLYN) 5 MG tablet Take 5 mg by mouth daily as needed (fluid).   Yes Historical Provider, MD  Multiple Vitamin (MULTIVITAMIN WITH MINERALS) TABS tablet Take 1 tablet by mouth daily.   Yes Historical Provider, MD  nitroGLYCERIN (NITROSTAT) 0.4 MG SL tablet Place 1 tablet (0.4 mg  total) under the tongue every 5 (five) minutes as needed for chest pain. 05/06/13  Yes Carlena Bjornstad, MD  oxyCODONE (OXY IR/ROXICODONE) 5 MG immediate release tablet Take 1-2 tablets (5-10 mg total) by mouth every 3 (three) hours as needed for breakthrough pain. 03/15/14  Yes Meredith Pel, MD  PARoxetine (PAXIL-CR) 25 MG 24 hr tablet Take 1 tablet (25 mg total) by mouth daily. Taken with 37.5 for a total = 62.5mg  02/02/14  Yes [name], PA-C  PARoxetine (PAXIL-CR) 37.5 MG 24 hr tablet Take 1 tablet (37.5 mg total) by mouth daily. Taken with 25mg  for a total = 62.5 mg 02/02/14  Yes Stanton Kidney  B Dixon, PA-C  Potassium Chloride ER 20 MEQ TBCR Take 40 mEq by mouth 2 (two) times daily. 10/20/13  Yes Amy D Clegg, NP  pregabalin (LYRICA) 150 MG capsule Take 150 mg by mouth at bedtime.    Yes Historical Provider, MD  torsemide (DEMADEX) 20 MG tablet Take 40-80 mg by mouth See admin instructions. Take 80 mg in am and 40 mg around 3pm. 10/20/13  Yes Amy D Clegg, NP  vitamin B-12 (CYANOCOBALAMIN) 1000 MCG tablet Take 1,000 mcg by mouth daily.   Yes Historical Provider, MD  Ascorbic Acid (VITAMIN C PO) Take 1 tablet by mouth daily. 1000 mg    Historical Provider, MD  clonazePAM (KLONOPIN) 1 MG tablet Take 1 mg by mouth 3 (three) times daily as needed for anxiety. 02/02/14   Lonie Peak Dixon, PA-C  Cyanocobalamin (VITAMIN B12 PO) Take 1 tablet by mouth daily. 1000 mg    Historical Provider, MD  Oxycodone HCl 10 MG TABS Take 10 mg by mouth daily as needed. 03/15/14   Historical Provider, MD    Physical Exam: Filed Vitals:   03/30/14 1900 03/30/14 1939 03/30/14 2000 03/30/14 2030  BP: 154/73 162/100 158/58 163/87  Pulse: 77 71 66 75  Temp:      TempSrc:      Resp: 13 24 15 11   SpO2: 98% 100% 100% 100%    General: Alert, Awake and Oriented to Time, Place and Person. Appear in moderate distress Eyes: PERRL ENT: Oral Mucosa clear moist. Neck: no JVD Cardiovascular: S1 and S2 Present, no Murmur, Peripheral  Pulses Present Respiratory: Bilateral Air entry equal and Decreased, Clear to Auscultation, noCrackles, no wheezes Abdomen: Bowel Sound present, Soft and non tender Skin: no Rash Extremities: right shoulder incision intact without any evidence of infection Trace Pedal edema, ni calf tenderness Neurologic: Grossly no focal neuro deficit.  Labs on Admission:  CBC:  Recent Labs Lab 03/30/14 1744  WBC 6.4  NEUTROABS 5.2  HGB 12.5  HCT 39.1  MCV 82.0  PLT 251    CMP     Component Value Date/Time   NA 126* 03/30/2014 1744   K 4.4 03/30/2014 1744   CL 87* 03/30/2014 1744   CO2 20 03/30/2014 1744   GLUCOSE 930* 03/30/2014 1744   GLUCOSE >444* 03/30/2014 1543   BUN 25* 03/30/2014 1744   CREATININE 1.06 03/30/2014 1744   CREATININE 1.24* 05/06/2013 1636   CALCIUM 9.2 03/30/2014 1744   PROT 6.9 03/30/2014 1744   ALBUMIN 3.6 03/30/2014 1744   AST 14 03/30/2014 1744   ALT 17 03/30/2014 1744   ALKPHOS 183* 03/30/2014 1744   BILITOT 0.8 03/30/2014 1744   GFRNONAA 54* 03/30/2014 1744   GFRNONAA 74 12/29/2012 1419   GFRAA 63* 03/30/2014 1744   GFRAA 86 12/29/2012 1419     Recent Labs Lab 03/30/14 1744  LIPASE 17   No results for input(s): AMMONIA in the last 168 hours.  No results for input(s): CKTOTAL, CKMB, CKMBINDEX, TROPONINI in the last 168 hours. BNP (last 3 results)  Recent Labs  10/20/13 2321 10/28/13 0925 03/30/14 1744  PROBNP 6326.0* 1146.0* 1392.0*    Radiological Exams on Admission: Dg Chest Port 1 View  03/30/2014   CLINICAL DATA:  Vomiting.  EXAM: PORTABLE CHEST - 1 VIEW  COMPARISON:  02/01/2014  FINDINGS: Normal heart size and mediastinal contours. No acute infiltrate or edema. No effusion or pneumothorax. No acute osseous findings.  IMPRESSION: No active disease.   Electronically Signed   By:  Jorje Guild M.D.   On: 03/30/2014 18:07    Assessment/Plan Principal Problem:   DKA (diabetic ketoacidoses) Active Problems:   Essential  hypertension   Obesity   Chronic combined systolic and diastolic CHF (congestive heart failure)   Rotator cuff tear   Hyperglycemia   1. DKA (diabetic ketoacidoses) The patient is presenting with complaints of nausea vomiting acid reflux and diarrhea. On further workup she is found to have mild acidosis with anion gap. She also has hyperglycemia suggesting DKA as the etiology. She has been started on IV insulin drip with IV fluids and that she will remain nothing by mouth until her anion gap is closed. We will follow serial BMP. Once her anion gap is low she will be restarted on her home insulin NPH 70 units 3 times a day along with advancing the gradual diet and placing her on sliding scale.  2.chronic diastolic heart failure. Patient has history of chronic combined systolic and diastolic heart failure therefore we will be judicious in giving her IV fluids and monitor daily ins and outs and daily weight.  3.recent shoulder surgery. At present there is no evidence of infection continue to closely monitor. Orthopedic has decided to provide the patient then day course of doxycycline which I would continue here in the hospital as well.  4.hypertension. Continue home medications.  Advance goals of care discussion: full code   DVT Prophylaxis: subcutaneous Heparin Nutrition: nothing by mouth except medications  Disposition: Admitted to inpatient in telemetry unit.  Author: Berle Mull, MD [facility] Pager: [phone] 03/30/2014, 9:00 PM    If 7PM-7AM, please contact night-coverage [email or web address] Password [email or web address]

## 2014-03-31 ENCOUNTER — Encounter (HOSPITAL_BASED_OUTPATIENT_CLINIC_OR_DEPARTMENT_OTHER): Payer: Commercial Managed Care - PPO | Attending: General Surgery

## 2014-03-31 ENCOUNTER — Encounter (HOSPITAL_COMMUNITY): Payer: Self-pay | Admitting: *Deleted

## 2014-03-31 DIAGNOSIS — L97819 Non-pressure chronic ulcer of other part of right lower leg with unspecified severity: Secondary | ICD-10-CM | POA: Insufficient documentation

## 2014-03-31 DIAGNOSIS — I87333 Chronic venous hypertension (idiopathic) with ulcer and inflammation of bilateral lower extremity: Secondary | ICD-10-CM | POA: Insufficient documentation

## 2014-03-31 DIAGNOSIS — E0810 Diabetes mellitus due to underlying condition with ketoacidosis without coma: Secondary | ICD-10-CM

## 2014-03-31 LAB — CBC WITH DIFFERENTIAL/PLATELET
Basophils Absolute: 0.1 10*3/uL (ref 0.0–0.1)
Basophils Relative: 1 % (ref 0–1)
Eosinophils Absolute: 0.3 10*3/uL (ref 0.0–0.7)
Eosinophils Relative: 3 % (ref 0–5)
HCT: 33.8 % — ABNORMAL LOW (ref 36.0–46.0)
Hemoglobin: 11.6 g/dL — ABNORMAL LOW (ref 12.0–15.0)
Lymphocytes Relative: 24 % (ref 12–46)
Lymphs Abs: 1.9 10*3/uL (ref 0.7–4.0)
MCH: 26.4 pg (ref 26.0–34.0)
MCHC: 34.3 g/dL (ref 30.0–36.0)
MCV: 76.8 fL — ABNORMAL LOW (ref 78.0–100.0)
Monocytes Absolute: 0.7 10*3/uL (ref 0.1–1.0)
Monocytes Relative: 9 % (ref 3–12)
Neutro Abs: 5 10*3/uL (ref 1.7–7.7)
Neutrophils Relative %: 63 % (ref 43–77)
Platelets: 287 10*3/uL (ref 150–400)
RBC: 4.4 MIL/uL (ref 3.87–5.11)
RDW: 15.1 % (ref 11.5–15.5)
WBC: 8 10*3/uL (ref 4.0–10.5)

## 2014-03-31 LAB — GLUCOSE, CAPILLARY
Glucose-Capillary: 129 mg/dL — ABNORMAL HIGH (ref 70–99)
Glucose-Capillary: 178 mg/dL — ABNORMAL HIGH (ref 70–99)
Glucose-Capillary: 181 mg/dL — ABNORMAL HIGH (ref 70–99)
Glucose-Capillary: 184 mg/dL — ABNORMAL HIGH (ref 70–99)
Glucose-Capillary: 203 mg/dL — ABNORMAL HIGH (ref 70–99)
Glucose-Capillary: 209 mg/dL — ABNORMAL HIGH (ref 70–99)
Glucose-Capillary: 233 mg/dL — ABNORMAL HIGH (ref 70–99)
Glucose-Capillary: 68 mg/dL — ABNORMAL LOW (ref 70–99)
Glucose-Capillary: 73 mg/dL (ref 70–99)
Glucose-Capillary: 77 mg/dL (ref 70–99)

## 2014-03-31 LAB — BASIC METABOLIC PANEL
Anion gap: 15 (ref 5–15)
Anion gap: 13 (ref 5–15)
BUN: 22 mg/dL (ref 6–23)
BUN: 22 mg/dL (ref 6–23)
CO2: 22 mEq/L (ref 19–32)
CO2: 23 mEq/L (ref 19–32)
Calcium: 9.2 mg/dL (ref 8.4–10.5)
Calcium: 8.9 mg/dL (ref 8.4–10.5)
Chloride: 99 mEq/L (ref 96–112)
Chloride: 100 mEq/L (ref 96–112)
Creatinine, Ser: 1.11 mg/dL — ABNORMAL HIGH (ref 0.50–1.10)
Creatinine, Ser: 1.12 mg/dL — ABNORMAL HIGH (ref 0.50–1.10)
GFR calc Af Amer: 59 mL/min — ABNORMAL LOW (ref >90)
GFR calc Af Amer: 59 mL/min — ABNORMAL LOW (ref >90)
GFR calc non Af Amer: 51 mL/min — ABNORMAL LOW (ref >90)
GFR calc non Af Amer: 51 mL/min — ABNORMAL LOW (ref >90)
Glucose, Bld: 198 mg/dL — ABNORMAL HIGH (ref 70–99)
Glucose, Bld: 161 mg/dL — ABNORMAL HIGH (ref 70–99)
Potassium: 3.4 mEq/L — ABNORMAL LOW (ref 3.7–5.3)
Potassium: 3.3 mEq/L — ABNORMAL LOW (ref 3.7–5.3)
Sodium: 136 mEq/L — ABNORMAL LOW (ref 137–147)
Sodium: 136 mEq/L — ABNORMAL LOW (ref 137–147)

## 2014-03-31 LAB — HEMOGLOBIN A1C
Hgb A1c MFr Bld: 11.1 % — ABNORMAL HIGH (ref <5.7)
Mean Plasma Glucose: 272 mg/dL — ABNORMAL HIGH (ref <117)

## 2014-03-31 LAB — PROTIME-INR
INR: 1.07 (ref 0.00–1.49)
Prothrombin Time: 14.1 seconds (ref 11.6–15.2)

## 2014-03-31 MED ORDER — POTASSIUM CHLORIDE CRYS ER 20 MEQ PO TBCR
40.0000 meq | EXTENDED_RELEASE_TABLET | Freq: Once | ORAL | Status: AC
  Administered 2014-03-31: 40 meq via ORAL
  Filled 2014-03-31: qty 2

## 2014-03-31 MED ORDER — INSULIN NPH (HUMAN) (ISOPHANE) 100 UNIT/ML ~~LOC~~ SUSP
50.0000 [IU] | Freq: Three times a day (TID) | SUBCUTANEOUS | Status: DC
  Administered 2014-03-31: 50 [IU] via SUBCUTANEOUS
  Filled 2014-03-31: qty 10

## 2014-03-31 MED ORDER — TORSEMIDE 20 MG PO TABS
40.0000 mg | ORAL_TABLET | Freq: Every day | ORAL | Status: DC
  Administered 2014-03-31: 40 mg via ORAL
  Filled 2014-03-31 (×2): qty 2

## 2014-03-31 MED ORDER — CLONAZEPAM 1 MG PO TABS: 1.0000 mg | ORAL_TABLET | Freq: Three times a day (TID) | ORAL | Status: DC | PRN

## 2014-03-31 MED ORDER — INSULIN ASPART 100 UNIT/ML ~~LOC~~ SOLN: 0.0000 [IU] | Freq: Every day | SUBCUTANEOUS | Status: DC

## 2014-03-31 MED ORDER — INSULIN NPH (HUMAN) (ISOPHANE) 100 UNIT/ML ~~LOC~~ SUSP
70.0000 [IU] | Freq: Three times a day (TID) | SUBCUTANEOUS | Status: DC
  Administered 2014-03-31 (×3): 70 [IU] via SUBCUTANEOUS
  Filled 2014-03-31: qty 10

## 2014-03-31 MED ORDER — POLYETHYLENE GLYCOL 3350 17 G PO PACK
17.0000 g | PACK | Freq: Every day | ORAL | Status: DC
  Administered 2014-03-31 – 2014-04-01 (×2): 17 g via ORAL
  Filled 2014-03-31 (×3): qty 1

## 2014-03-31 MED ORDER — INSULIN ASPART 100 UNIT/ML ~~LOC~~ SOLN
0.0000 [IU] | Freq: Three times a day (TID) | SUBCUTANEOUS | Status: DC
Start: 2014-03-31 — End: 2014-04-01
  Administered 2014-03-31: 3 [IU] via SUBCUTANEOUS
  Administered 2014-03-31 (×2): 2 [IU] via SUBCUTANEOUS
  Administered 2014-04-01: 5 [IU] via SUBCUTANEOUS
  Administered 2014-04-01: 1 [IU] via SUBCUTANEOUS

## 2014-03-31 MED ORDER — INSULIN ASPART 100 UNIT/ML ~~LOC~~ SOLN: 0.0000 [IU] | Freq: Three times a day (TID) | SUBCUTANEOUS | Status: DC

## 2014-03-31 NOTE — Progress Notes (Signed)
Inpatient Diabetes Program Recommendations  AACE/ADA: New Consensus Statement on Inpatient Glycemic Control (2013)  Target Ranges:  Prepandial:   less than 140 mg/dL      Peak postprandial:   less than 180 mg/dL (1-2 hours)      Critically ill patients:  140 - 180 mg/dL     Results for ARETINA, FINGERHUT (MRN [id number]) as of 03/31/2014 09:56  Ref. Range 03/30/2014 17:44  Glucose Latest Range: 70-99 mg/dL 930 (HH)     Admitted with N&V, Early DKA.  Glucose 930 mg/dl, Anion gap 19, CO2 20.     Home DM Meds: 70/30 insulin- 70 units tid with meals   Current Meds: NPH insulin 70 units tid with meals   Novolog Sensitive SSI   **Patient given IVF and started on IV insulin drip last PM.  Transitioned off IV insulin drip early this AM.  Was given 70 units NPH at 3am.  **Spoke with patient about her admission diagnosis, treatment, labs, etc.  Patient told me she hasn't been eating well for several weeks and has not taken any of her insulin.  Explained to patient that by not taking her insulin she missed the long-acting portion of her insulin and her blood sugars became elevated.  Explained the process of DKA (dehydration and acidosis) and why this occurred.  Encouraged patient to check her CBGs more often at home when she is sick (Q4 or Q6 hours) and that if she does not eat much that she should still take at least a small portion of her 70/30 insulin.  Encouraged patient to follow up with her Endocrinologist Dr. [name] to collaborate and come up with an insulin plan for sick days.  Patient agreeable.    Will follow Wyn Quaker RN, MSN, CDE Diabetes Coordinator Inpatient Diabetes Program Team Pager: [phone] (8a-10p)

## 2014-03-31 NOTE — Plan of Care (Signed)
Problem: Consults Goal: Diabetic Ketoacidosis (DKA) Patient Education See Patient Education Modules for education specifics.  Outcome: Progressing Goal: Diabetes Guidelines if Diabetic/Glucose > 140 If diabetic or lab glucose is > 140 mg/dl - Initiate Diabetes/Hyperglycemia Guidelines & Document Interventions  Outcome: Progressing  Problem: Phase I Progression Outcomes Goal: NPO or per MD order Outcome: Not Applicable Date Met:  34/35/68 Goal: K+ level approaching normal with therapy Outcome: Progressing Goal: Pain controlled with appropriate interventions Outcome: Progressing Goal: OOB as tolerated unless otherwise ordered Outcome: Progressing Goal: Initial discharge plan identified Outcome: Progressing Goal: Voiding-avoid urinary catheter unless indicated Outcome: Progressing Goal: Pt. states reason for hospitalization Outcome: Progressing  Problem: Phase II Progression Outcomes Goal: CBGs stable on SQ insulin Outcome: Progressing Goal: Potassium level normalizing Outcome: Progressing  Problem: Phase III Progression Outcomes Goal: Discharge plan remains appropriate-arrangements made Outcome: Progressing  Problem: Discharge Progression Outcomes Goal: Hemodynamically stable Outcome: Progressing

## 2014-03-31 NOTE — Consult Note (Addendum)
WOC wound consult note Reason for Consult: Consult requested for right leg.  Pt is followed by the outpatient wound care center and was due to have an appointment for dressing change today.   Wound type: Removed Una boot to assess leg.   Measurement: 3 area of full thickness stasis ulcers are covered with what appears to be a collagen-type dressing.  This topical treatment is not available in the [place].   Wound bed: small yellow dressings covering 3 wound areas; .3X.3cm .5X.5cm, and .3X.3cm Drainage (amount, consistency, odor)  No odor or drainage Periwound: Pt states previous drainage and edema has improved. Dressing procedure/placement/frequency: Ortho tech called to re-apply Una boot and coban over previous dressings which were left intact.  Pt can resume follow-up with the outpatient wound care center after discharge.  Plan to change Una boot next Friday if still in the hospital at that time.  Please re-consult if further assistance is needed.  Thank-you,  Julien Girt MSN, [place], [place], [place], [place]

## 2014-03-31 NOTE — Progress Notes (Signed)
Pt arrive to unit in no s/s of distress. Pt A&Ox4. Pt oriented to unit and room. Whiteboard updated. Callbell within reach. Will continue to monitor. Nurse Adonis Huguenin receive report from ED - report passed on to me at bedside. VS stable. Tele applied. Insulin drip started in ED.

## 2014-03-31 NOTE — Progress Notes (Signed)
On call NP Schorr notified that patient has not had a BM since 10/28. Pt said she has not been eating due to nausea at home. Pt states she is unsure if she feels constipated because she is so hungry.

## 2014-03-31 NOTE — Progress Notes (Signed)
Hypoglycemic Event  CBG: 68  Treatment: 15 GM carbohydrate snack  Symptoms: None  Follow-up CBG: Time:2314 CBG Result:77  Possible Reasons for Event: Unknown  Comments/MD notified:Callahan, NP    Grandville Silos, Jenna Ardoin L  Remember to initiate Hypoglycemia Order Set & complete

## 2014-03-31 NOTE — Plan of Care (Signed)
Problem: Food- and Nutrition-Related Knowledge Deficit (NB-1.1) Goal: Nutrition education Formal process to instruct or train a patient/client in a skill or to impart knowledge to help patients/clients voluntarily manage or modify food choices and eating behavior to maintain or improve health. Outcome: Completed/Met Date Met:  03/31/14  RD consulted for nutrition education regarding diabetes.     Lab Results  Component Value Date    HGBA1C 11.1* 09/20/2013    RD provided "Carbohydrate Counting for People with Diabetes" handout from the Academy of [employer]etics. Discussed different food groups and their effects on blood sugar, emphasizing carbohydrate-containing foods. Provided list of carbohydrates and recommended serving sizes of common foods.  Discussed importance of controlled and consistent carbohydrate intake throughout the day. Provided examples of ways to balance meals/snacks and encouraged intake of high-fiber, whole grain complex carbohydrates. Teach back method used.  Expect good compliance.  Body mass index is 42.92 kg/(m^2). Pt meets criteria for morbid obesity based on current BMI.  Current diet order is carbohydrate modified, low fiber, patient is consuming approximately 100% of meals at this time. Labs and medications reviewed. No further nutrition interventions warranted at this time. RD contact information provided. If additional nutrition issues arise, please re-consult RD.  Laurette Schimke RD, LDN

## 2014-03-31 NOTE — Progress Notes (Signed)
Orthopedic Tech Progress Note Patient Details:  Susan [name] 10/22/1950 MI:[id number]  Ortho Devices Type of Ortho Device: Louretta Parma boot Ortho Device/Splint Location: rle Ortho Device/Splint Interventions: Application   Hildred Priest 03/31/2014, 3:46 PM

## 2014-03-31 NOTE — Progress Notes (Signed)
PROGRESS NOTE  Susan [name] [id number] DOB: 07-11-1950 DOA: 03/30/2014 PCP: [name], PA-C  63 yo female with pmh of DM, CAD, diastolic dysfunction, and recent right shoulder surgery.  She has been having vomiting 3-4 times daily since discharge after shoulder surgery.  She also had diarrhea but this has since resolved - her last bowel movement was 10/28.  She stopped taking her insulin due to vomiting and was found to be in mild DKA in the ER.   Assessment/Plan:  DKA (diabetic ketoacidoses) Secondary to vomiting and lack of insulin. Now off of the insulin drip.  Gap has closed.  Restarted NPH 50 TID + SSI-S.  Normally on 70 units of 70/30 TID at home.  Last vomiting was last evening.  Supportive care.  Carb Mod diet if tolerated. Appreciate Diabetic Coordinator Recommendations.  DM II Last hgb A1C was 09/20/2013 = 11.1 Patient sees Dr. [name] of Endocrinology  Will refer her back to Dr. [name] for continued management of her DM.  Vomiting / Diarrhea Diarrhea resolved. Vomiting could be a recent GI illness vs post anesthesia side effect. Supportive care.  Chronic diastolic heart failure. Currently euvoemic.   IV Fluids are saline locked. Was on 120 mg of torsemide at home.  This was held on admission.  Will add back 40 mg of torsemide today. Daily weights (3 lbs up today).  Recent shoulder surgery. At present there is no evidence of infection continue to closely monitor. Orthopedic placed patient on PO doxy which has been continued in the hospital.  Hypertension. Controlled. Continue home medications.   Right lower extremity Wound  Being cared for by outpatient wound care.  Due for dressing change today. WOC appreciated.   DVT Prophylaxis:  lovenox  Code Status: full code Family Communication: patient is alert and orientated  Disposition Plan: home when able to tolerate diet.   Consultants:   Procedures:   Antibiotics: Anti-infectives    Start      Dose/Rate Route Frequency Ordered Stop   03/30/14 2200  doxycycline (VIBRAMYCIN) capsule 100 mg  Status:  Discontinued     100 mg Oral 2 times daily 03/30/14 2100 03/30/14 2128       HPI/Subjective: No diarrhea.  Last emesis was last night.  Reports her LLE is chronically pink in color.  Objective: Filed Vitals:   03/31/14 0211 03/31/14 0500 03/31/14 0623 03/31/14 1002  BP: 125/53  137/60 124/58  Pulse: 79  75 99  Temp: 98.8 F (37.1 C)  98.4 F (36.9 C)   TempSrc: Oral  Oral   Resp: 14  20   Height:      Weight:  116.983 kg (257 lb 14.4 oz)    SpO2: 94%  99% 100%    Intake/Output Summary (Last 24 hours) at 03/31/14 1141 Last data filed at 03/31/14 0600  Gross per 24 hour  Intake 553.21 ml  Output   1250 ml  Net -696.79 ml   Filed Weights   03/30/14 2148 03/31/14 0500  Weight: 115.35 kg (254 lb 4.8 oz) 116.983 kg (257 lb 14.4 oz)    Exam: General: Well developed, well nourished, NAD, appears stated age, pleasant. HEENT:  EOMI, Anicteic Sclera, MMM. No pharyngeal erythema or exudates  Neck: Supple, no JVD, no masses  Cardiovascular: RRR, S1 S2 auscultated, no rubs, murmurs or gallops.   Respiratory: Clear to auscultation bilaterally with equal chest rise  Abdomen: Soft, nontender, nondistended, + bowel sounds  Extremities: warm dry without cyanosis clubbing or edema.  Right shoulder incision healing well.  No erythema or drainage.  Right lower extremity wrapped.  Left lower extremity soft with minimal swelling but bright pink in color. Neuro: AAOx3, cranial nerves grossly intact. Strength 5/5 in upper and lower extremities  Skin: Without rashes exudates or nodules.   Psych: Normal affect and demeanor with intact judgement and insight       Data Reviewed: Basic Metabolic Panel:  Recent Labs Lab 03/30/14 1744 03/30/14 2055 03/30/14 2258 03/31/14 0049 03/31/14 0246  NA 126* 132* 134* 136* 136*  K 4.4 3.6* 3.8 3.4* 3.3*  CL 87* 95* 96 99 100  CO2 20 22 22  22 23   GLUCOSE 930* 573* 271* 198* 161*  BUN 25* 23 22 22 22   CREATININE 1.06 1.05 1.05 1.11* 1.12*  CALCIUM 9.2 9.1 9.3 9.2 8.9   Liver Function Tests:  Recent Labs Lab 03/30/14 1744  AST 14  ALT 17  ALKPHOS 183*  BILITOT 0.8  PROT 6.9  ALBUMIN 3.6    Recent Labs Lab 03/30/14 1744  LIPASE 17  CBC:  Recent Labs Lab 03/30/14 1744 03/31/14 0515  WBC 6.4 8.0  NEUTROABS 5.2 5.0  HGB 12.5 11.6*  HCT 39.1 33.8*  MCV 82.0 76.8*  PLT 251 287   BNP (last 3 results)  Recent Labs  10/20/13 2321 10/28/13 0925 03/30/14 1744  PROBNP 6326.0* 1146.0* 1392.0*   CBG:  Recent Labs Lab 03/31/14 0007 03/31/14 0111 03/31/14 0215 03/31/14 0319 03/31/14 0739  GLUCAP 209* 203* 178* 129* 184*    Recent Results (from the past 240 hour(s))  MRSA PCR Screening     Status: None   Collection Time: 03/30/14 10:05 PM  Result Value Ref Range Status   MRSA by PCR NEGATIVE NEGATIVE Final    Comment:        The GeneXpert MRSA Assay (FDA approved for NASAL specimens only), is one component of a comprehensive MRSA colonization surveillance program. It is not intended to diagnose MRSA infection nor to guide or monitor treatment for MRSA infections.      Studies: Dg Chest Port 1 View  03/30/2014   CLINICAL DATA:  Vomiting.  EXAM: PORTABLE CHEST - 1 VIEW  COMPARISON:  02/01/2014  FINDINGS: Normal heart size and mediastinal contours. No acute infiltrate or edema. No effusion or pneumothorax. No acute osseous findings.  IMPRESSION: No active disease.   Electronically Signed   By: Jorje Guild M.D.   On: 03/30/2014 18:07    Scheduled Meds: . aspirin EC  81 mg Oral Daily  . atorvastatin  80 mg Oral Daily  . carvedilol  12.5 mg Oral BID WC  . clopidogrel  75 mg Oral Q breakfast  . enoxaparin (LOVENOX) injection  40 mg Subcutaneous Q24H  . famotidine (PEPCID) IV  20 mg Intravenous Q12H  . insulin aspart  0-9 Units Subcutaneous TID WC  . insulin NPH Human  70 Units  Subcutaneous TID AC  . isosorbide dinitrate  10 mg Oral BID  . levothyroxine  50 mcg Oral QAC breakfast  . losartan  50 mg Oral Daily  . PARoxetine  25 mg Oral Daily  . PARoxetine  37.5 mg Oral Daily  . polyethylene glycol  17 g Oral Daily  . pregabalin  150 mg Oral QHS  . torsemide  40 mg Oral Daily   Continuous Infusions:   Principal Problem:   DKA (diabetic ketoacidoses) Active Problems:   Essential hypertension   Obesity   Chronic combined systolic and diastolic CHF (  congestive heart failure)   Rotator cuff tear   Hyperglycemia    Karen Kitchens  [facility] Pager [phone]. If 7PM-7AM, please contact night-coverage at [email or web address], password [facility] 03/31/2014, 11:41 AM  LOS: 1 day

## 2014-04-01 DIAGNOSIS — N179 Acute kidney failure, unspecified: Secondary | ICD-10-CM

## 2014-04-01 DIAGNOSIS — N189 Chronic kidney disease, unspecified: Secondary | ICD-10-CM

## 2014-04-01 LAB — URINALYSIS, ROUTINE W REFLEX MICROSCOPIC
Bilirubin Urine: NEGATIVE
Glucose, UA: 250 mg/dL — AB
Hgb urine dipstick: NEGATIVE
Ketones, ur: NEGATIVE mg/dL
Nitrite: NEGATIVE
Protein, ur: 30 mg/dL — AB
Specific Gravity, Urine: 1.018 (ref 1.005–1.030)
Urobilinogen, UA: 0.2 mg/dL (ref 0.0–1.0)
pH: 5 (ref 5.0–8.0)

## 2014-04-01 LAB — GLUCOSE, CAPILLARY
Glucose-Capillary: 69 mg/dL — ABNORMAL LOW (ref 70–99)
Glucose-Capillary: 131 mg/dL — ABNORMAL HIGH (ref 70–99)
Glucose-Capillary: 137 mg/dL — ABNORMAL HIGH (ref 70–99)
Glucose-Capillary: 55 mg/dL — ABNORMAL LOW (ref 70–99)
Glucose-Capillary: 79 mg/dL (ref 70–99)
Glucose-Capillary: 129 mg/dL — ABNORMAL HIGH (ref 70–99)
Glucose-Capillary: 270 mg/dL — ABNORMAL HIGH (ref 70–99)
Glucose-Capillary: 389 mg/dL — ABNORMAL HIGH (ref 70–99)

## 2014-04-01 LAB — BASIC METABOLIC PANEL
Anion gap: 16 — ABNORMAL HIGH (ref 5–15)
BUN: 40 mg/dL — ABNORMAL HIGH (ref 6–23)
CO2: 20 mEq/L (ref 19–32)
Calcium: 8.6 mg/dL (ref 8.4–10.5)
Chloride: 99 mEq/L (ref 96–112)
Creatinine, Ser: 2.3 mg/dL — ABNORMAL HIGH (ref 0.50–1.10)
GFR calc Af Amer: 25 mL/min — ABNORMAL LOW (ref >90)
GFR calc non Af Amer: 21 mL/min — ABNORMAL LOW (ref >90)
Glucose, Bld: 121 mg/dL — ABNORMAL HIGH (ref 70–99)
Potassium: 4.1 mEq/L (ref 3.7–5.3)
Sodium: 135 mEq/L — ABNORMAL LOW (ref 137–147)

## 2014-04-01 LAB — URINE MICROSCOPIC-ADD ON

## 2014-04-01 MED ORDER — SODIUM CHLORIDE 0.9 % IV SOLN
INTRAVENOUS | Status: AC
  Administered 2014-04-01: 09:00:00 via INTRAVENOUS

## 2014-04-01 MED ORDER — DEXTROSE 50 % IV SOLN
INTRAVENOUS | Status: AC
Start: 2014-04-01 — End: 2014-04-01
  Filled 2014-04-01: qty 50

## 2014-04-01 MED ORDER — INSULIN ASPART 100 UNIT/ML ~~LOC~~ SOLN
0.0000 [IU] | Freq: Three times a day (TID) | SUBCUTANEOUS | Status: DC
  Administered 2014-04-02: 1 [IU] via SUBCUTANEOUS
  Administered 2014-04-02 (×2): 2 [IU] via SUBCUTANEOUS

## 2014-04-01 MED ORDER — DEXTROSE 50 % IV SOLN: 25.0000 mL | Freq: Once | INTRAVENOUS | Status: AC | PRN

## 2014-04-01 MED ORDER — INSULIN ASPART 100 UNIT/ML ~~LOC~~ SOLN
0.0000 [IU] | Freq: Every day | SUBCUTANEOUS | Status: DC
  Administered 2014-04-01: 5 [IU] via SUBCUTANEOUS

## 2014-04-01 MED ORDER — INSULIN NPH (HUMAN) (ISOPHANE) 100 UNIT/ML ~~LOC~~ SUSP
30.0000 [IU] | Freq: Three times a day (TID) | SUBCUTANEOUS | Status: DC
  Administered 2014-04-01 – 2014-04-02 (×4): 30 [IU] via SUBCUTANEOUS

## 2014-04-01 MED ORDER — GLUCOSE 40 % PO GEL: 1.0000 | ORAL | Status: DC | PRN

## 2014-04-01 MED ORDER — FAMOTIDINE 20 MG PO TABS
20.0000 mg | ORAL_TABLET | Freq: Two times a day (BID) | ORAL | Status: DC
  Administered 2014-04-01 – 2014-04-02 (×3): 20 mg via ORAL
  Filled 2014-04-01 (×4): qty 1

## 2014-04-01 MED ORDER — GLUCOSE 40 % PO GEL
ORAL | Status: AC
  Filled 2014-04-01: qty 1

## 2014-04-01 MED ORDER — DEXTROSE 50 % IV SOLN
50.0000 mL | Freq: Once | INTRAVENOUS | Status: AC | PRN
  Administered 2014-04-01: 50 mL via INTRAVENOUS

## 2014-04-01 NOTE — Progress Notes (Signed)
Hypoglycemic Event  CBG: 55  Treatment: D50 IV 50 mL  Symptoms: Pale and Sweaty  Follow-up CBG: [id number] CBG Result:137  Possible Reasons for Event: Unknown  Comments/MD notified:Callahan, NP    Grandville Silos, Susan [name]  Remember to initiate Hypoglycemia Order Set & complete

## 2014-04-01 NOTE — Progress Notes (Signed)
Pt c/o burning upon urination.  Dr. Cruzita Lederer notified and UA sent.

## 2014-04-01 NOTE — Progress Notes (Signed)
PROGRESS NOTE  Susan [name] ZOX:[id number] DOB: 01/03/1951 DOA: 03/30/2014 PCP: Frazier Richards, PA-C  63 yo female with pmh of DM, CAD, diastolic dysfunction, and recent right shoulder surgery.  She has been having vomiting 3-4 times daily since discharge after shoulder surgery.  She also had diarrhea but this has since resolved - her last bowel movement was 10/28.  She stopped taking her insulin due to vomiting and was found to be in mild DKA in the ER.   Assessment/Plan:  DKA (diabetic ketoacidoses) Secondary to vomiting and lack of insulin, she was initiated on insulin drip, that was discontinued when her gap closed, and she is now on insulin NPH plus sliding scale. She was hypoglycemic overnight 11/6 - 11/7, likely in the setting of worsening of her renal function, her insulin regimen has been readjusted 11/7 in the morning. She is eating well and tolerating a regular diet Appreciate Diabetic Coordinator Recommendations.  DM II - poorly controlled with an elevated hemoglobin A1c to 11.1 Patient sees Dr. Talmage Nap of Endocrinology  Will refer her back to Dr. Talmage Nap for continued management of her DM.  Acute on chronic renal failure - patient has CKD stage III with a baseline creatinine around 1.2-1.6, her creatinine this morning increased to 2.3. We'll discontinue the torsemide today as well as the losartan. She looks a bit on the dry side, we'll provide normal saline at 75 mL per hour for the next 10 hours for gentle hydration. Repeat renal function in the morning.  Vomiting / Diarrhea Diarrhea resolved. Vomiting could be a recent GI illness vs post anesthesia side effect. Supportive care.  Chronic diastolic heart failure. Currently euvolemic and on the dry side clinically, however her daily weights show a 6 pound weight gain???  Recent shoulder surgery. At present there is no evidence of infection continue to closely monitor. Orthopedic placed patient on PO doxy which has been  continued in the hospital.  Hypertension. Controlled. Continue home medications.   Right lower extremity Wound  Being cared for by outpatient wound care.  Due for dressing change today. WOC appreciated.   DVT Prophylaxis:  lovenox  Code Status: full code Family Communication: patient is alert and orientated  Disposition Plan: home when renal function improving   Consultants: None   Procedures: None   Antibiotics: Anti-infectives    Start     Dose/Rate Route Frequency Ordered Stop   03/30/14 2200  doxycycline (VIBRAMYCIN) capsule 100 mg  Status:  Discontinued     100 mg Oral 2 times daily 03/30/14 2100 03/30/14 2128       HPI/Subjective: - she is feeling well, no specific complaints.  Objective: Filed Vitals:   03/31/14 1700 03/31/14 2204 04/01/14 0203 04/01/14 0524  BP: 118/54 113/62 126/56 132/70  Pulse: 69 70 72 76  Temp: 97.7 F (36.5 C) 98.5 F (36.9 C) 97.8 F (36.6 C) 98.3 F (36.8 C)  TempSrc: Oral Oral Oral Oral  Resp: 19 18 12 18   Height:      Weight:    118.298 kg (260 lb 12.8 oz)  SpO2: 100% 98% 98% 91%    Intake/Output Summary (Last 24 hours) at 04/01/14 0646 Last data filed at 03/31/14 1900  Gross per 24 hour  Intake    700 ml  Output      0 ml  Net    700 ml   Filed Weights   03/30/14 2148 03/31/14 0500 04/01/14 0524  Weight: 115.35 kg (254 lb 4.8 oz) 116.983 kg (  257 lb 14.4 oz) 118.298 kg (260 lb 12.8 oz)    Exam: General: no acute distress Neck: Supple, no JVD Cardiovascular: RRR, S1 S2 auscultated, no rubs, murmurs or gallops.   Respiratory: Clear to auscultation bilaterally with equal chest rise  Abdomen: Soft, nontender, nondistended, + bowel sounds  Extremities: chronic venous stasis changes bilaterally Skin: Without rashes exudates or nodules.    Data Reviewed: Basic Metabolic Panel:  Recent Labs Lab 03/30/14 2055 03/30/14 2258 03/31/14 0049 03/31/14 0246 04/01/14 0258  NA 132* 134* 136* 136* 135*  K 3.6* 3.8  3.4* 3.3* 4.1  CL 95* 96 99 100 99  CO2 22 22 22 23 20   GLUCOSE 573* 271* 198* 161* 121*  BUN 23 22 22 22  40*  CREATININE 1.05 1.05 1.11* 1.12* 2.30*  CALCIUM 9.1 9.3 9.2 8.9 8.6   Liver Function Tests:  Recent Labs Lab 03/30/14 1744  AST 14  ALT 17  ALKPHOS 183*  BILITOT 0.8  PROT 6.9  ALBUMIN 3.6    Recent Labs Lab 03/30/14 1744  LIPASE 17  CBC:  Recent Labs Lab 03/30/14 1744 03/31/14 0515  WBC 6.4 8.0  NEUTROABS 5.2 5.0  HGB 12.5 11.6*  HCT 39.1 33.8*  MCV 82.0 76.8*  PLT 251 287   BNP (last 3 results)  Recent Labs  10/20/13 2321 10/28/13 0925 03/30/14 1744  PROBNP 6326.0* 1146.0* 1392.0*   CBG:  Recent Labs Lab 03/31/14 2314 04/01/14 0209 04/01/14 0248 04/01/14 0543 04/01/14 0616  GLUCAP 77 69* 131* 55* 137*    Recent Results (from the past 240 hour(s))  MRSA PCR Screening     Status: None   Collection Time: 03/30/14 10:05 PM  Result Value Ref Range Status   MRSA by PCR NEGATIVE NEGATIVE Final    Comment:        The GeneXpert MRSA Assay (FDA approved for NASAL specimens only), is one component of a comprehensive MRSA colonization surveillance program. It is not intended to diagnose MRSA infection nor to guide or monitor treatment for MRSA infections.      Studies: Dg Chest Port 1 View  03/30/2014   CLINICAL DATA:  Vomiting.  EXAM: PORTABLE CHEST - 1 VIEW  COMPARISON:  02/01/2014  FINDINGS: Normal heart size and mediastinal contours. No acute infiltrate or edema. No effusion or pneumothorax. No acute osseous findings.  IMPRESSION: No active disease.   Electronically Signed   By: Tiburcio Pea M.D.   On: 03/30/2014 18:07    Scheduled Meds: . aspirin EC  81 mg Oral Daily  . atorvastatin  80 mg Oral Daily  . carvedilol  12.5 mg Oral BID WC  . clopidogrel  75 mg Oral Q breakfast  . enoxaparin (LOVENOX) injection  40 mg Subcutaneous Q24H  . famotidine (PEPCID) IV  20 mg Intravenous Q12H  . insulin aspart  0-9 Units Subcutaneous  TID WC  . insulin NPH Human  50 Units Subcutaneous TID AC  . isosorbide dinitrate  10 mg Oral BID  . levothyroxine  50 mcg Oral QAC breakfast  . PARoxetine  25 mg Oral Daily  . PARoxetine  37.5 mg Oral Daily  . polyethylene glycol  17 g Oral Daily  . pregabalin  150 mg Oral QHS   Continuous Infusions:   Principal Problem:   DKA (diabetic ketoacidoses) Active Problems:   Essential hypertension   Obesity   Chronic combined systolic and diastolic CHF (congestive heart failure)   Rotator cuff tear   Hyperglycemia  Time spent: 25  minutes  Blythe Veach M. Elvera Lennox, MD [facility] [phone] If 7PM-7AM, please contact night-coverage at [email or web address], password [facility] 04/01/2014, 6:46 AM  LOS: 2 days

## 2014-04-01 NOTE — Progress Notes (Signed)
Patient CBG 389, no current night coverage ordered. Rogue Bussing, NP paged and orders placed.

## 2014-04-01 NOTE — Plan of Care (Signed)
Problem: Phase I Progression Outcomes Goal: Nausea/vomiting controlled with antiemetics Outcome: Progressing Goal: Pain controlled with appropriate interventions Outcome: Completed/Met Date Met:  04/01/14 Goal: OOB as tolerated unless otherwise ordered Outcome: Progressing Goal: Voiding-avoid urinary catheter unless indicated Outcome: Completed/Met Date Met:  04/01/14 Goal: Pt. states reason for hospitalization Outcome: Completed/Met Date Met:  04/01/14 Goal: Other Phase I Outcomes/Goals Outcome: Not Applicable Date Met:  78/37/54

## 2014-04-01 NOTE — Progress Notes (Addendum)
Hypoglycemic Event  CBG: 69  Treatment: D50 IV 25 mL  Symptoms: Pale and Sweaty  Follow-up CBG: [id number] CBG Result:131  Possible Reasons for Event: Unknown  Comments/MD notified: Rogue Bussing, NP    Grandville Silos, Tityana Pagan L  Remember to initiate Hypoglycemia Order Set & complete

## 2014-04-02 LAB — CBC
HCT: 35.2 % — ABNORMAL LOW (ref 36.0–46.0)
Hemoglobin: 11.4 g/dL — ABNORMAL LOW (ref 12.0–15.0)
MCH: 25.7 pg — ABNORMAL LOW (ref 26.0–34.0)
MCHC: 32.4 g/dL (ref 30.0–36.0)
MCV: 79.5 fL (ref 78.0–100.0)
Platelets: 217 10*3/uL (ref 150–400)
RBC: 4.43 MIL/uL (ref 3.87–5.11)
RDW: 15.5 % (ref 11.5–15.5)
WBC: 6.2 10*3/uL (ref 4.0–10.5)

## 2014-04-02 LAB — BASIC METABOLIC PANEL
Anion gap: 12 (ref 5–15)
BUN: 48 mg/dL — ABNORMAL HIGH (ref 6–23)
CO2: 23 mEq/L (ref 19–32)
Calcium: 9 mg/dL (ref 8.4–10.5)
Chloride: 102 mEq/L (ref 96–112)
Creatinine, Ser: 1.49 mg/dL — ABNORMAL HIGH (ref 0.50–1.10)
GFR calc Af Amer: 42 mL/min — ABNORMAL LOW (ref >90)
GFR calc non Af Amer: 36 mL/min — ABNORMAL LOW (ref >90)
Glucose, Bld: 125 mg/dL — ABNORMAL HIGH (ref 70–99)
Potassium: 4.7 mEq/L (ref 3.7–5.3)
Sodium: 137 mEq/L (ref 137–147)

## 2014-04-02 LAB — GLUCOSE, CAPILLARY
Glucose-Capillary: 155 mg/dL — ABNORMAL HIGH (ref 70–99)
Glucose-Capillary: 140 mg/dL — ABNORMAL HIGH (ref 70–99)
Glucose-Capillary: 171 mg/dL — ABNORMAL HIGH (ref 70–99)
Glucose-Capillary: 162 mg/dL — ABNORMAL HIGH (ref 70–99)

## 2014-04-02 MED ORDER — CIPROFLOXACIN HCL 250 MG PO TABS
250.0000 mg | ORAL_TABLET | Freq: Two times a day (BID) | ORAL | Status: DC
Start: 2014-04-02 — End: 2014-04-18

## 2014-04-02 MED ORDER — TORSEMIDE 20 MG PO TABS: 40.0000 mg | ORAL_TABLET | Freq: Every day | ORAL | Status: DC

## 2014-04-02 MED ORDER — CIPROFLOXACIN HCL 250 MG PO TABS
250.0000 mg | ORAL_TABLET | Freq: Two times a day (BID) | ORAL | Status: DC
  Administered 2014-04-02: 250 mg via ORAL
  Filled 2014-04-02 (×3): qty 1

## 2014-04-02 NOTE — Discharge Summary (Signed)
Physician Discharge Summary  Chenell Dulong UXL:[id number] DOB: 1951-02-07 DOA: 03/30/2014  PCP: Frazier Richards, PA-C  Admit date: 03/30/2014 Discharge date: 04/02/2014  Time spent: 35 minutes  Recommendations for Outpatient Follow-up:  1. Follow-up in wound care in 1 day 2. Follow-up with primary care provider in one week 3. Follow-up with endocrinology in 1 week  Recommendations for primary care physician for things to follow:  Monitor CBGs  Discharge Diagnoses:  Principal Problem:   DKA (diabetic ketoacidoses) Active Problems:   Essential hypertension   Obesity   Chronic combined systolic and diastolic CHF (congestive heart failure)   Rotator cuff tear   Hyperglycemia  Discharge Condition: stable, refused SNF and discharged home with home health PT  Diet recommendation: diabetic  Filed Weights   03/31/14 0500 04/01/14 0524 04/02/14 0800  Weight: 116.983 kg (257 lb 14.4 oz) 118.298 kg (260 lb 12.8 oz) 119.523 kg (263 lb 8 oz)   History of present illness:  Susan [name] is a 63 y.o. female with Past medical history of diabetes mellitus, coronary artery disease, hypothyroidism, chronic diastolic dysfunction, RBBB, recent right shoulder arthroscopy with open muscle repair. The patient presents with complaints of nausea vomiting and diarrhea that has been ongoing since her recent surgery. She mentions that she was at her baseline when she was in the hospital after reaching home after a few days she started having initially vomiting multiple episodes on a daily basis roughly 3-4 episodes of nonbilious nonbloody vomitus. This was followed by one week of diarrhea. She continues to have some acid reflux as well. She denies any fever or chills. She has some cough but no shortness of breath or chest pain. She has seen her orthopedic doctor on Monday who prescribed her some doxycycline with which she continues to have nausea vomiting therefore she saw her PCP today. She mentions she has not  been able to eat and she has not been taking her insulin or any other medication since her recent surgery. The patient is coming from home. And at her baseline independent for most of her ADL.  Hospital Course:  DKA (diabetic ketoacidoses) - patient was admitted to the hospital DKA secondary to vomiting and lack of insulin. She was initially started on insulin drip which was discontinued when her gap closed, and she was restarted on NPH plus sliding scale. Patient's CBGs have remained fairly stable, and she is to go home on her previous home insulin regimen. She is followed by Dr. Talmage Nap from endocrinology, and per what the patient is telling me she used to be on Lantus and bolus short-acting insulin in the past however it did not work for her. Her hemoglobin A1c shows poor control and is elevated at 11.1. She was advised to continue her previous regimen as her DKA was felt to be secondary to not having any insulin on board, maintain an accurate CBG and follow-up with her primary endocrinologist in 1 week. She also had mild dysuria and urinalysis was slightly positive, which may be another cause for her DKA, and she was started on ciprofloxacin to continue as an outpatient. Acute on chronic renal failure - patient has CKD stage III with a baseline creatinine around 1.2-1.6. The second after admission her creatinine increased to 2.3, likely due to her ARB/diuretic regimen.These were discontinued and she was gently hydrated with subsequent improvement to her creatinine to 1.5 on discharge. I discontinued her losartan on discharge, she can follow up with her PCP as an outpatient and if her  renal function remained stable that can probably be later added back on. Her torsemide dose was decreased on discharge. Vomiting / Diarrhea - these have resolved, likely due to a transient GI illness, patient able to tolerate regular food without any abdominal pain nausea vomiting or diarrhea. Chronic diastolic heart failure. -  Currently euvolemic  Recent shoulder surgery. - At present there is no evidence of infection continue to closely monitor. Patient was relatively weak and unable to use a walker due to her from surgery, PT evaluated her and recommended skilled nursing facility, however patient is declining this option at this point and prefers to be discharged home. Home health PT was ordered and case management was consulted prior to patient's discharge. Right lower extremity Wound - Being cared for by outpatient wound care.   Procedures:  None    Consultations:  None   Discharge Exam: Filed Vitals:   04/02/14 0205 04/02/14 0630 04/02/14 0800 04/02/14 1334  BP: 134/53 125/65  183/88  Pulse: 78 82  79  Temp: 97.9 F (36.6 C) 97.9 F (36.6 C)  97.6 F (36.4 C)  TempSrc: Oral Oral  Oral  Resp: 17 18  20   Height:      Weight:   119.523 kg (263 lb 8 oz)   SpO2: 98% 97%  99%    General: no apparent distress Cardiovascular: regular rate and rhythm Respiratory: clear to auscultation bilaterally  Discharge Instructions     Medication List    STOP taking these medications        doxycycline 100 MG capsule  Commonly known as:  VIBRAMYCIN     losartan 50 MG tablet  Commonly known as:  COZAAR      TAKE these medications        albuterol 108 (90 BASE) MCG/ACT inhaler  Commonly known as:  PROVENTIL HFA;VENTOLIN HFA  Inhale 2 puffs into the lungs every 6 (six) hours as needed for wheezing or shortness of breath.     aspirin EC 81 MG tablet  Take 81 mg by mouth daily.     atorvastatin 80 MG tablet  Commonly known as:  LIPITOR  Take 1 tablet (80 mg total) by mouth daily.     carvedilol 12.5 MG tablet  Commonly known as:  COREG  Take 12.5 mg by mouth 2 (two) times daily with a meal.     ciprofloxacin 250 MG tablet  Commonly known as:  CIPRO  Take 1 tablet (250 mg total) by mouth 2 (two) times daily.     clonazePAM 1 MG tablet  Commonly known as:  KLONOPIN  Take 1 mg by mouth 3  (three) times daily as needed for anxiety.     clopidogrel 75 MG tablet  Commonly known as:  PLAVIX  Take 1 tablet (75 mg total) by mouth daily with breakfast.     fish oil-omega-3 fatty acids 1000 MG capsule  Take 1 g by mouth daily.     insulin NPH-regular Human (70-30) 100 UNIT/ML injection  Commonly known as:  NOVOLIN 70/30  Inject 70 Units into the skin 3 (three) times daily with meals. 75 units three times daily     isosorbide dinitrate 10 MG tablet  Commonly known as:  ISORDIL  Take 1 tablet (10 mg total) by mouth 2 (two) times daily.     levothyroxine 50 MCG tablet  Commonly known as:  SYNTHROID, LEVOTHROID  Take 50 mcg by mouth daily before breakfast.     metFORMIN 500 MG tablet  Commonly known as:  GLUCOPHAGE  Take 500 mg by mouth 2 (two) times daily.     metolazone 5 MG tablet  Commonly known as:  ZAROXOLYN  Take 5 mg by mouth daily as needed (fluid).     multivitamin with minerals Tabs tablet  Take 1 tablet by mouth daily.     nitroGLYCERIN 0.4 MG SL tablet  Commonly known as:  NITROSTAT  Place 1 tablet (0.4 mg total) under the tongue every 5 (five) minutes as needed for chest pain.     oxyCODONE 5 MG immediate release tablet  Commonly known as:  Oxy IR/ROXICODONE  Take 1-2 tablets (5-10 mg total) by mouth every 3 (three) hours as needed for breakthrough pain.     Oxycodone HCl 10 MG Tabs  Take 10 mg by mouth daily as needed.     PARoxetine 37.5 MG 24 hr tablet  Commonly known as:  PAXIL-CR  Take 1 tablet (37.5 mg total) by mouth daily. Taken with 25mg  for a total = 62.5 mg     PARoxetine 25 MG 24 hr tablet  Commonly known as:  PAXIL-CR  Take 1 tablet (25 mg total) by mouth daily. Taken with 37.5 for a total = 62.5mg      Potassium Chloride ER 20 MEQ Tbcr  Take 40 mEq by mouth 2 (two) times daily.     pregabalin 150 MG capsule  Commonly known as:  LYRICA  Take 150 mg by mouth at bedtime.     torsemide 20 MG tablet  Commonly known as:  DEMADEX    Take 2 tablets (40 mg total) by mouth daily. Take 40 mg daily, increase to 80 daily if weight gain or edema     VITAMIN B12 PO  Take 1 tablet by mouth daily. 1000 mg     vitamin B-12 1000 MCG tablet  Commonly known as:  CYANOCOBALAMIN  Take 1,000 mcg by mouth daily.     vitamin C 1000 MG tablet  Take 1,000 mg by mouth daily.     VITAMIN C PO  Take 1 tablet by mouth daily. 1000 mg     Vitamin D 2000 UNITS tablet  Take 2,000 Units by mouth daily.           Follow-up Information    Follow up with [facility] BETH, PA-C. Schedule an appointment as soon as possible for a visit in 1 week.   Specialty:  Physician Assistant   Contact information:   [address] [place] [address] [phone]       Follow up with Dorisann Frames, MD. Schedule an appointment as soon as possible for a visit in 1 week.   Specialty:  Endocrinology   Contact information:   [address] [place] [address] [phone]       Follow up with [facility].   Why:  home health physical and occupational therapy and aide   Contact information:   [address] [place] [address] [phone]       The results of significant diagnostics from this hospitalization (including imaging, microbiology, ancillary and laboratory) are listed below for reference.    Significant Diagnostic Studies: Dg Chest Port 1 View  03/30/2014   CLINICAL DATA:  Vomiting.  EXAM: PORTABLE CHEST - 1 VIEW  COMPARISON:  02/01/2014  FINDINGS: Normal heart size and mediastinal contours. No acute infiltrate or edema. No effusion or pneumothorax. No acute osseous findings.  IMPRESSION: No active disease.  Electronically Signed   By: Tiburcio Pea M.D.   On: 03/30/2014 18:07    Microbiology: Recent Results (from the past 240 hour(s))  MRSA PCR Screening     Status: None   Collection Time: 03/30/14 10:05 PM  Result Value Ref Range Status   MRSA by PCR NEGATIVE NEGATIVE  Final    Comment:        The GeneXpert MRSA Assay (FDA approved for NASAL specimens only), is one component of a comprehensive MRSA colonization surveillance program. It is not intended to diagnose MRSA infection nor to guide or monitor treatment for MRSA infections.      Labs: Basic Metabolic Panel:  Recent Labs Lab 03/30/14 2258 03/31/14 0049 03/31/14 0246 04/01/14 0258 04/02/14 0601  NA 134* 136* 136* 135* 137  K 3.8 3.4* 3.3* 4.1 4.7  CL 96 99 100 99 102  CO2 22 22 23 20 23   GLUCOSE 271* 198* 161* 121* 125*  BUN 22 22 22  40* 48*  CREATININE 1.05 1.11* 1.12* 2.30* 1.49*  CALCIUM 9.3 9.2 8.9 8.6 9.0   Liver Function Tests:  Recent Labs Lab 03/30/14 1744  AST 14  ALT 17  ALKPHOS 183*  BILITOT 0.8  PROT 6.9  ALBUMIN 3.6    Recent Labs Lab 03/30/14 1744  LIPASE 17   No results for input(s): AMMONIA in the last 168 hours. CBC:  Recent Labs Lab 03/30/14 1744 03/31/14 0515 04/02/14 0601  WBC 6.4 8.0 6.2  NEUTROABS 5.2 5.0  --   HGB 12.5 11.6* 11.4*  HCT 39.1 33.8* 35.2*  MCV 82.0 76.8* 79.5  PLT 251 287 217   BNP: BNP (last 3 results)  Recent Labs  10/20/13 2321 10/28/13 0925 03/30/14 1744  PROBNP 6326.0* 1146.0* 1392.0*   CBG:  Recent Labs Lab 04/01/14 2112 04/02/14 0213 04/02/14 0752 04/02/14 1143 04/02/14 1631  GLUCAP 389* 155* 140* 171* 162*       Signed:  Karyme Mcconathy  [facility] 04/02/2014, 6:27 PM

## 2014-04-02 NOTE — Progress Notes (Signed)
04/02/14 Patient discharged home today, IV site removed, Telemetry removed, Discharge instructions reviewed with patient and medications reviewed.

## 2014-04-02 NOTE — Care Management Note (Signed)
    Page 1 of 1   04/02/2014     3:40:21 PM CARE MANAGEMENT NOTE 04/02/2014  Patient:  KYNEDI, PROFITT   Account Number:  [id number]  Date Initiated:  04/02/2014  Documentation initiated by:  [facility]  Subjective/Objective Assessment:   QPE:AKLTYV vomiting and diarrhea     Action/Plan:   discharge planning   Anticipated DC Date:  04/02/2014   Anticipated DC Plan:  [place]  CM consult      [facility] Choice  HOME HEALTH   Choice offered to / List presented to:  C-1 Patient        [place] arranged  [place].   Status of service:  Completed, signed off Medicare Important Message given?   (If response is "NO", the following Medicare IM given date fields will be blank) Date Medicare IM given:   Medicare IM given by:   Date Additional Medicare IM given:   Additional Medicare IM given by:    Discharge Disposition:  [place]  Per UR Regulation:    If discussed at Long Length of Stay Meetings, dates discussed:    Comments:  04/02/14 15:35 CM met with pt in room to offer choice of home health agency.  Pt chooses [facility] to render HHPT/OT/Aide. Address and contact information verified by pt.  referral called to [facility] rep, Stephanie. Pt lives with husband.   No DME needed.  No other CM needs were communicated.  Mariane Masters, BSN, CM [phone].

## 2014-04-02 NOTE — Evaluation (Signed)
Physical Therapy Evaluation Patient Details Name: Jodell Ciccolella MRN: [id number] DOB: 04-08-1951 Today's Date: 04/02/2014   History of Present Illness  Patient is a 63 yo female admitted 03/30/14 with N/V and diarrhea.  Patient with DKA.  PMH:  Recent Rt shoulder surgery, DM, Neuropathy, CAD, MI, tachycardia, RBBB, CVA, CHF, HTN, CKD, anxiety, depression.  Clinical Impression  Patient presents with problems listed below.  Will benefit from PT to address problems and maximize independence.  Possible d/c today.  Recommended SNF due to decreased balance and mobility, and will be alone during day.  Patient declined SNF.  Therefore, recommend HHPT, HHOT, and [place] aide at discharge.  Unable to use w/c in home (will not fit through her mobile home).  Encouraged patient to have someone with her as much as possible (does not have 24 hour assist.)    Follow Up Recommendations Home health PT;Supervision/Assistance - 24 hour (Recommended SNF-patient refused.  Does not have 24 hr assist)    Equipment Recommendations  None recommended by PT (wheelchair will not fit through home)    Recommendations for Other Services       Precautions / Restrictions Precautions Precautions: Fall;Shoulder Type of Shoulder Precautions: no ROM of Rt shoulder, NWB RUE (from 03/15/14 post-op orders) Shoulder Interventions: Shoulder sling/immobilizer Required Braces or Orthoses: Sling Restrictions Weight Bearing Restrictions: No      Mobility  Bed Mobility                  Transfers Overall transfer level: Needs assistance Equipment used: Rolling walker (2 wheeled) (Modified to use with LUE only) Transfers: Sit to/from Stand Sit to Stand: Min guard         General transfer comment: Close min guard for balance/safety  Ambulation/Gait Ambulation/Gait assistance: Mod assist Ambulation Distance (Feet): 62 Feet Assistive device: Rolling walker (2 wheeled) (Modified to use with LUE only) Gait  Pattern/deviations: Step-through pattern;Decreased step length - right;Decreased step length - left;Decreased stride length;Shuffle;Ataxic;Trunk flexed;Wide base of support Gait velocity: Decreased Gait velocity interpretation: Below normal speed for age/gender General Gait Details: Verbal cues for safety.  Patient with significantly decreased balance, with knees buckling and use of hip strategy for balance.  Decreased sensation in feet impacting balance.  Patient with loss of balance x3 during gait requiring mod assist to prevent fall.  Stairs            Wheelchair Mobility    Modified Rankin (Stroke Patients Only)       Balance Overall balance assessment: Needs assistance         Standing balance support: Single extremity supported Standing balance-Leahy Scale: Poor Standing balance comment: Loss of balance in all directions especially during gait.                             Pertinent Vitals/Pain Pain Assessment: 0-10 Pain Score: 9  Pain Location: Rt shoulder Pain Descriptors / Indicators: Aching;Sore Pain Intervention(s): Monitored during session (Applied sling (off when PT entered room))    Home Living Family/patient expects to be discharged to:: Private residence Living Arrangements: Spouse/significant other (Husband works during day) Available Help at Discharge: Family;Friend(s);Available PRN/intermittently (Will be alone during day) Type of Home: Mobile home Home Access: Ramped entrance     Home Layout: One level Home Equipment: Shower seat;Cane - single point;Grab bars - tub/shower      Prior Function Level of Independence: Independent with assistive device(s)  Comments: Uses cane for long distances     Hand Dominance   Dominant Hand: Right    Extremity/Trunk Assessment   Upper Extremity Assessment: RUE deficits/detail RUE Deficits / Details: s/p rt shoulder arthoscopy with rotator cuff repair; decreased ROM and  strength RUE: Unable to fully assess due to pain;Unable to fully assess due to immobilization       Lower Extremity Assessment: RLE deficits/detail;LLE deficits/detail RLE Deficits / Details: Strength grossly 4/5;  Unna boot on RLE due to wounds LLE Deficits / Details: Strength grossly 4/5     Communication   Communication: No difficulties  Cognition Arousal/Alertness: Awake/alert Behavior During Therapy: Anxious Overall Cognitive Status: Within Functional Limits for tasks assessed (Decreased safety awareness)                      General Comments General comments (skin integrity, edema, etc.): Wounds Rt lower leg, with Unna boot applied    Exercises        Assessment/Plan    PT Assessment Patient needs continued PT services  PT Diagnosis Difficulty walking;Abnormality of gait;Generalized weakness;Acute pain   PT Problem List Decreased strength;Decreased activity tolerance;Decreased balance;Decreased mobility;Decreased coordination;Decreased knowledge of use of DME;Decreased safety awareness;Impaired sensation;Obesity;Pain;Decreased skin integrity  PT Treatment Interventions DME instruction;Gait training;Functional mobility training;Therapeutic activities;Therapeutic exercise;Patient/family education   PT Goals (Current goals can be found in the Care Plan section) Acute Rehab PT Goals Patient Stated Goal: To go home PT Goal Formulation: With patient Time For Goal Achievement: 04/09/14 Potential to Achieve Goals: Fair    Frequency Min 3X/week   Barriers to discharge Decreased caregiver support Patient's husband works during day.  Does not have 24 hour assist available.    Co-evaluation               End of Session   Activity Tolerance: Patient limited by fatigue;Patient limited by pain Patient left: in chair;with call bell/phone within reach Nurse Communication: Mobility status         Time: 1410-1441 PT Time Calculation (min): 31  min   Charges:   PT Evaluation $Initial PT Evaluation Tier I: 1 Procedure PT Treatments $Gait Training: 23-37 mins   PT G Codes:          Despina Pole 04/02/2014, 2:57 PM Carita Pian. Sanjuana Kava, [place] Pager [phone]

## 2014-04-05 DIAGNOSIS — I87333 Chronic venous hypertension (idiopathic) with ulcer and inflammation of bilateral lower extremity: Secondary | ICD-10-CM | POA: Diagnosis present

## 2014-04-05 DIAGNOSIS — L97819 Non-pressure chronic ulcer of other part of right lower leg with unspecified severity: Secondary | ICD-10-CM | POA: Diagnosis not present

## 2014-04-14 ENCOUNTER — Telehealth: Payer: Self-pay | Admitting: Family Medicine

## 2014-04-14 DIAGNOSIS — Z9119 Patient's noncompliance with other medical treatment and regimen: Secondary | ICD-10-CM

## 2014-04-14 DIAGNOSIS — Z91199 Patient's noncompliance with other medical treatment and regimen due to unspecified reason: Secondary | ICD-10-CM

## 2014-04-14 NOTE — Telephone Encounter (Signed)
Agree with above, to ED for evaluation

## 2014-04-14 NOTE — Telephone Encounter (Signed)
I called the patient.  I told her we can not ignore this.  She must go back to the ED and have this evaluated.  Looking at hosp discharge, pt has failed to make any hosp follow up appts with [name] or her Endocrinologist.  She SAYS she is taking her meds correctly and DOING what she is suppose to.  I strongly reinforced to her the seriousness of the rectal bleeding and told her she MUST go to the ED and have this evaluated.

## 2014-04-14 NOTE — Telephone Encounter (Signed)
Pt reported to PT today that she had noticed blood in her stool on Wednesday, more yesterday, even worse today.

## 2014-04-17 DIAGNOSIS — Z9119 Patient's noncompliance with other medical treatment and regimen: Secondary | ICD-10-CM | POA: Insufficient documentation

## 2014-04-17 DIAGNOSIS — Z91199 Patient's noncompliance with other medical treatment and regimen due to unspecified reason: Secondary | ICD-10-CM | POA: Insufficient documentation

## 2014-04-17 NOTE — Telephone Encounter (Signed)
-----   Message from Roney Marion sent at 04/17/2014  9:29 AM EST ----- Contact: [phone] Patient is returning your call from Friday.

## 2014-04-18 ENCOUNTER — Encounter: Payer: Self-pay | Admitting: Physician Assistant

## 2014-04-18 ENCOUNTER — Ambulatory Visit (INDEPENDENT_AMBULATORY_CARE_PROVIDER_SITE_OTHER): Payer: Commercial Managed Care - PPO | Admitting: Physician Assistant

## 2014-04-18 VITALS — BP 134/76 | HR 80 | Temp 97.7°F | Resp 20 | Wt 272.0 lb

## 2014-04-18 DIAGNOSIS — N182 Chronic kidney disease, stage 2 (mild): Secondary | ICD-10-CM

## 2014-04-18 DIAGNOSIS — I5042 Chronic combined systolic (congestive) and diastolic (congestive) heart failure: Secondary | ICD-10-CM

## 2014-04-18 DIAGNOSIS — Z9119 Patient's noncompliance with other medical treatment and regimen: Secondary | ICD-10-CM

## 2014-04-18 DIAGNOSIS — Z91199 Patient's noncompliance with other medical treatment and regimen due to unspecified reason: Secondary | ICD-10-CM

## 2014-04-18 DIAGNOSIS — K921 Melena: Secondary | ICD-10-CM

## 2014-04-18 DIAGNOSIS — K649 Unspecified hemorrhoids: Secondary | ICD-10-CM

## 2014-04-18 MED ORDER — HYDROCORTISONE ACETATE 25 MG RE SUPP: 25.0000 mg | Freq: Two times a day (BID) | RECTAL | Status: DC

## 2014-04-18 NOTE — Progress Notes (Signed)
Patient ID: Susan [name] MRN: [id number], DOB: 02/06/1951, 63 y.o. Date of Encounter: '@DATE' @  Chief Complaint:  Chief Complaint  Patient presents with  . hosp follow up    c/o swelling, see ph notes about rectal bleeding,  HH nurse told her to stop taking ASA and Plavix until seen by PCP    HPI: 63 y.o. year old white female  presents with above complaints.   She called our office recently, reporting bright red blood per rectum. She was directed to go to the emergency room but she did not. She did stop her aspirin and Plavix as directed. She says that she saw bright red blood per rectum on Friday 04/14/14 after straining to have a bowel movement. She says that she had no bowel movement on that Saturday or Sunday. She then used a stool softener to avoid further straining. This morning she did have a bowel movement but saw no blood. Has had no melena.  She has had a colonoscopy by Dr. Collene Mares. Per our records it appears that that was March 2011.  Regarding her aspirin and Plavix she states that she has had no PTCA or stent in over a year. She says that it is probably actually about 6 years ago. Has had no recent CVA or known stroke in well over a year.  She says that she just started noticing this increased swelling in her legs yesterday. Says that she was up on her feet all day running errands etc. yesterday. However this morning after sleeping and lying in the bed with her feet up she really did not notice that the swelling was any better this morning. He reports taking her Demadex--takes 2 in the morning and takes 2 at noon She says that she took a Zaroxolyn at 2:00 PM today. Says that this was the first time she has taken this in a long time.   Past Medical History  Diagnosis Date  . Tachycardia     Sinus tachycardia  . Dyslipidemia   . Hypothyroidism   . CAD (coronary artery disease)     MI in 2000 - MI  2007 - treated bare metal stent (no nuclear since then as 9/11)  . HTN  (hypertension)   . Chronic diastolic heart failure     a) ECHO (08/2013) EF 55-60% and RV function nl b) RHC (08/2013) RA 4, RV 30/5/7, PA 25/10 (16), PCWP 7, Fick CO/CI 6.3/2.7, PVR 1.5 WU, PA 61 and 66%  . RBBB (right bundle branch block)     Old  . Urinary incontinence   . Obesity   . Syncope     likely due to low blood sugar  . Carotid artery disease   . Acute MI 1999; 2007  . CHF (congestive heart failure)   . Anxiety   . Depression   . Type II diabetes mellitus   . Peripheral neuropathy   . Venous insufficiency   . Stroke     mini strokes  . PONV (postoperative nausea and vomiting)   . Renal insufficiency     DENIES  . Anemia     hx  . Anginal pain   . Exertional shortness of breath   . Daily headache     "~ every other day; since I fell in June" (03/15/2014)  . Osteoarthritis   . Arthritis     "generalized" (03/15/2014)     Home Meds: Outpatient Prescriptions Prior to Visit  Medication Sig Dispense Refill  . albuterol (PROVENTIL  HFA;VENTOLIN HFA) 108 (90 BASE) MCG/ACT inhaler Inhale 2 puffs into the lungs every 6 (six) hours as needed for wheezing or shortness of breath.    . Ascorbic Acid (VITAMIN C PO) Take 1 tablet by mouth daily. 1000 mg    . atorvastatin (LIPITOR) 80 MG tablet Take 1 tablet (80 mg total) by mouth daily. 90 tablet 3  . carvedilol (COREG) 12.5 MG tablet Take 12.5 mg by mouth 2 (two) times daily with a meal.    . clonazePAM (KLONOPIN) 1 MG tablet Take 1 mg by mouth 3 (three) times daily as needed for anxiety.    . Cyanocobalamin (VITAMIN B12 PO) Take 1 tablet by mouth daily. 1000 mg    . fish oil-omega-3 fatty acids 1000 MG capsule Take 1 g by mouth daily.     . insulin NPH-regular Human (NOVOLIN 70/30) (70-30) 100 UNIT/ML injection Inject 70 Units into the skin 3 (three) times daily with meals. 75 units three times daily    . isosorbide dinitrate (ISORDIL) 10 MG tablet Take 1 tablet (10 mg total) by mouth 2 (two) times daily. 60 tablet 3  .  levothyroxine (SYNTHROID, LEVOTHROID) 50 MCG tablet Take 50 mcg by mouth daily before breakfast.    . metFORMIN (GLUCOPHAGE) 500 MG tablet Take 500 mg by mouth 2 (two) times daily.     . metolazone (ZAROXOLYN) 5 MG tablet Take 5 mg by mouth daily as needed (fluid).    . Multiple Vitamin (MULTIVITAMIN WITH MINERALS) TABS tablet Take 1 tablet by mouth daily.    . nitroGLYCERIN (NITROSTAT) 0.4 MG SL tablet Place 1 tablet (0.4 mg total) under the tongue every 5 (five) minutes as needed for chest pain. 25 tablet 3  . PARoxetine (PAXIL-CR) 25 MG 24 hr tablet Take 1 tablet (25 mg total) by mouth daily. Taken with 37.5 for a total = 62.59m 30 tablet 5  . PARoxetine (PAXIL-CR) 37.5 MG 24 hr tablet Take 1 tablet (37.5 mg total) by mouth daily. Taken with 277mfor a total = 62.5 mg 30 tablet 5  . Potassium Chloride ER 20 MEQ TBCR Take 40 mEq by mouth 2 (two) times daily. 120 tablet 6  . pregabalin (LYRICA) 150 MG capsule Take 150 mg by mouth at bedtime.     . torsemide (DEMADEX) 20 MG tablet Take 2 tablets (40 mg total) by mouth daily. Take 40 mg daily, increase to 80 daily if weight gain or edema    . vitamin B-12 (CYANOCOBALAMIN) 1000 MCG tablet Take 1,000 mcg by mouth daily.    . Marland Kitchenspirin EC 81 MG tablet Take 81 mg by mouth daily.    . clopidogrel (PLAVIX) 75 MG tablet Take 1 tablet (75 mg total) by mouth daily with breakfast. (Patient not taking: Reported on 04/18/2014) 30 tablet 6  . oxyCODONE (OXY IR/ROXICODONE) 5 MG immediate release tablet Take 1-2 tablets (5-10 mg total) by mouth every 3 (three) hours as needed for breakthrough pain. (Patient not taking: Reported on 04/18/2014) 60 tablet 0  . Oxycodone HCl 10 MG TABS Take 10 mg by mouth daily as needed.  0  . Ascorbic Acid (VITAMIN C) 1000 MG tablet Take 1,000 mg by mouth daily.    . Cholecalciferol (VITAMIN D) 2000 UNITS tablet Take 2,000 Units by mouth daily.    . ciprofloxacin (CIPRO) 250 MG tablet Take 1 tablet (250 mg total) by mouth 2 (two) times  daily. 10 tablet 0   No facility-administered medications prior to visit.  Allergies:  Allergies  Allergen Reactions  . Codeine Nausea And Vomiting    History   Social History  . Marital Status: Married    Spouse Name: N/A    Number of Children: 3  . Years of Education: 12th   Occupational History  .      unemploye   Social History Main Topics  . Smoking status: Former Smoker -- 3.00 packs/day for 32 years    Types: Cigarettes    Quit date: 10/24/1997  . Smokeless tobacco: Never Used  . Alcohol Use: Yes     Comment: "might have 2-3 daiquiris in the summer"  . Drug Use: No  . Sexual Activity: No   Other Topics Concern  . Not on file   Social History Narrative   Pt lives at home with her spouse.   Caffeine Use- 3 sodas daily    Family History  Problem Relation Age of Onset  . Coronary artery disease    . Heart attack Mother      Review of Systems:  See HPI for pertinent ROS. All other ROS negative.    Physical Exam: Blood pressure 134/76, pulse 80, temperature 97.7 F (36.5 C), temperature source Oral, resp. rate 20, weight 272 lb (123.378 kg)., Body mass index is 45.26 kg/(m^2). General: Obese WF. Appears in no acute distress. Neck: Supple. No thyromegaly. No lymphadenopathy. Lungs: Clear bilaterally to auscultation without wheezes, rales, or rhonchi. Breathing is unlabored. Heart: RRR with S1 S2. No murmurs, rubs, or gallops. Abdomen: Soft, non-tender, non-distended with normoactive bowel sounds. No hepatomegaly. No rebound/guarding. No obvious abdominal masses. Anoscopy: She has external hemorrhoids but these do not appear inflamed or bleeding. He also has extensive internal hemorrhoids that do appear purplish but I do not of bleeding or signs of recent bleeding. I see no fissures or fistulas or mass. Digital rectal exam also reveals no mass or abnormalities. Musculoskeletal:  Strength and tone normal for age. Extremities/Skin: She is wearing TED hose  stockings.  Pulled these down she has about 2+ pitting edema about half way up her calves bilaterally. Neuro: Alert and oriented X 3. Moves all extremities spontaneously. Gait is normal. CNII-XII grossly in tact. Psych:  Responds to questions appropriately with a normal affect.     ASSESSMENT AND PLAN:  63 y.o. year old female with  1. Chronic combined systolic and diastolic CHF (congestive heart failure) - BASIC METABOLIC PANEL WITH GFR  2. Non compliance with medical treatment  3. CKD (chronic kidney disease), stage II - BASIC METABOLIC PANEL WITH GFR  4. Hemorrhoids, unspecified hemorrhoid type - hydrocortisone (ANUSOL-HC) 25 MG suppository; Place 1 suppository (25 mg total) rectally 2 (two) times daily.  Dispense: 12 suppository; Refill: 0  5. Hematochezia - CBC with Differential   Regarding the bleeding--- We'll check a CBC to make sure she has not had significant blood loss. We'll treat with Anusol suppository. Told her to resume her aspirin and Plavix tomorrow. If she has further hematochezia follow-up immediately.  Regarding the lower extremity edema/CHF We'll check be met now to check her electrolytes and renal function. Old her to continue taking Zaroxolyn daily in addition to her Demadex until this edema resolves/returns to baseline. If she has to continue the Zaroxolyn for more than 3 days then she will need to take additional potassium with the Zaroxolyn.     [address] [place], [place], [facility] 04/18/2014 5:20 PM

## 2014-04-19 LAB — CBC WITH DIFFERENTIAL/PLATELET
Basophils Absolute: 0 10*3/uL (ref 0.0–0.1)
Basophils Relative: 0 % (ref 0–1)
Eosinophils Absolute: 0.3 10*3/uL (ref 0.0–0.7)
Eosinophils Relative: 5 % (ref 0–5)
HCT: 35.5 % — ABNORMAL LOW (ref 36.0–46.0)
Hemoglobin: 11.5 g/dL — ABNORMAL LOW (ref 12.0–15.0)
Lymphocytes Relative: 16 % (ref 12–46)
Lymphs Abs: 1.1 10*3/uL (ref 0.7–4.0)
MCH: 25.5 pg — ABNORMAL LOW (ref 26.0–34.0)
MCHC: 32.4 g/dL (ref 30.0–36.0)
MCV: 78.7 fL (ref 78.0–100.0)
MPV: 10.3 fL (ref 9.4–12.4)
Monocytes Absolute: 0.5 10*3/uL (ref 0.1–1.0)
Monocytes Relative: 7 % (ref 3–12)
Neutro Abs: 4.9 10*3/uL (ref 1.7–7.7)
Neutrophils Relative %: 72 % (ref 43–77)
Platelets: 296 10*3/uL (ref 150–400)
RBC: 4.51 MIL/uL (ref 3.87–5.11)
RDW: 15.4 % (ref 11.5–15.5)
WBC: 6.8 10*3/uL (ref 4.0–10.5)

## 2014-04-19 LAB — BASIC METABOLIC PANEL WITH GFR
BUN: 43 mg/dL — ABNORMAL HIGH (ref 6–23)
CO2: 27 mEq/L (ref 19–32)
Calcium: 9.3 mg/dL (ref 8.4–10.5)
Chloride: 101 mEq/L (ref 96–112)
Creat: 1.18 mg/dL — ABNORMAL HIGH (ref 0.50–1.10)
GFR, Est African American: 56 mL/min — ABNORMAL LOW
GFR, Est Non African American: 49 mL/min — ABNORMAL LOW
Glucose, Bld: 162 mg/dL — ABNORMAL HIGH (ref 70–99)
Potassium: 4.4 mEq/L (ref 3.5–5.3)
Sodium: 138 mEq/L (ref 135–145)

## 2014-04-27 ENCOUNTER — Other Ambulatory Visit: Payer: Self-pay | Admitting: Physician Assistant

## 2014-04-27 NOTE — Telephone Encounter (Signed)
Medication refilled per protocol. 

## 2014-04-28 ENCOUNTER — Other Ambulatory Visit: Payer: Self-pay | Admitting: *Deleted

## 2014-04-28 MED ORDER — CARVEDILOL 12.5 MG PO TABS: 12.5000 mg | ORAL_TABLET | Freq: Two times a day (BID) | ORAL | Status: DC

## 2014-05-04 ENCOUNTER — Encounter (HOSPITAL_COMMUNITY): Payer: Self-pay | Admitting: Internal Medicine

## 2014-06-02 ENCOUNTER — Encounter (HOSPITAL_BASED_OUTPATIENT_CLINIC_OR_DEPARTMENT_OTHER): Payer: Commercial Managed Care - PPO | Attending: Internal Medicine

## 2014-06-02 DIAGNOSIS — I87331 Chronic venous hypertension (idiopathic) with ulcer and inflammation of right lower extremity: Secondary | ICD-10-CM | POA: Insufficient documentation

## 2014-06-02 DIAGNOSIS — L97819 Non-pressure chronic ulcer of other part of right lower leg with unspecified severity: Secondary | ICD-10-CM | POA: Insufficient documentation

## 2014-06-02 DIAGNOSIS — L03116 Cellulitis of left lower limb: Secondary | ICD-10-CM | POA: Diagnosis not present

## 2014-06-02 LAB — GLUCOSE, CAPILLARY: Glucose-Capillary: 301 mg/dL — ABNORMAL HIGH (ref 70–99)

## 2014-06-05 NOTE — Progress Notes (Signed)
[facility]  NAME:  Susan [name], Susan [name]                     ACCOUNT NO.:  MEDICAL RECORD NO.:  [id number]      DATE OF BIRTH:  July 16, 1950  PHYSICIAN:  Ricard Dillon, M.D. VISIT DATE:  06/02/2014                                  OFFICE VISIT   LOCATION:  [place].  CHIEF COMPLAINT:  Wounds on her right leg.  HISTORY OF PRESENT ILLNESS:  This is a patient, who I believe has chronic venous insufficiency, also has a history of type 2 diabetes with peripheral neuropathy.  Over the last weeks, she has developed a small wound on the right posterior upper leg.  There is some itching and irritation around this and she has been scratching.  She also has a wound on the right anterior leg.  She has chronic erythema in the anterior leg, which the patient states has unchanged, and I think it is associated with chronic venous insufficiency and inflammation.  PAST MEDICAL HISTORY:  Include sinus tachycardia, hyperlipidemia, hypothyroidism, coronary artery disease with a bare-metal stent, chronic diastolic heart failure, hypertension, right bundle branch block, syncope, anxiety with depression, type 2 diabetes with peripheral neuropathy, venous insufficiency, stroke, renal insufficiency, and anemia.  CURRENT MEDICATIONS:  Aspirin 81 daily, Lipitor 80 daily, Coreg 12.5 b.i.d., Klonopin 1 mg 3 times a day, Plavix 75 daily, vitamin B12 1000 daily, insulin 70/30 20 units 3 times a day, Imdur 10 mEq b.i.d., Synthroid 50 daily, Cozaar 50 daily, metformin 500 b.i.d., and Zaroxolyn 5 mg daily.  PHYSICAL EXAMINATION:  VITAL SIGNS:  Temperature 98.1, pulse 83, respirations 20, blood pressure 125/62.  CBG is 147, blood sugar 301, weight 283 pounds.  Vascular assessment in this clinic was 0.97 on the right and 0.85 on the left in terms of ABI.  The right leg had intense erythema on the right anterior leg.  A small superficial wound compatible with venous  insufficiency ulcer was seen. This is superficial.  There is also a small area on the right posterior leg just below the popliteal fossa.  There is a very tiny open area but with an intensive amount of erythema around this, I think represents cellulitis.  IMPRESSION: 1. Severe venous hypertension and inflammation with a largely venous     stasis ulceration on the right anterior lower leg.  We applied     silver alginate to this with an Unna wrap. 2. Smaller ulcer on the posterior right leg, really of not completely     certain etiology.  This may be a scratching     injury part of the patient, however, there was surrounding erythema     here quite compatible with cellulitis.  I put her on doxycycline     for this and put a small area of silver alginate over this as well.     We will see her again in a week's time.          ______________________________ Ricard Dillon, M.D.     MGR/MEDQ  D:  06/02/2014  T:  06/03/2014  Job:  [id number]

## 2014-06-12 DIAGNOSIS — L97819 Non-pressure chronic ulcer of other part of right lower leg with unspecified severity: Secondary | ICD-10-CM | POA: Diagnosis not present

## 2014-06-12 DIAGNOSIS — L03116 Cellulitis of left lower limb: Secondary | ICD-10-CM | POA: Diagnosis not present

## 2014-06-12 DIAGNOSIS — I87331 Chronic venous hypertension (idiopathic) with ulcer and inflammation of right lower extremity: Secondary | ICD-10-CM | POA: Diagnosis not present

## 2014-06-15 DIAGNOSIS — M75121 Complete rotator cuff tear or rupture of right shoulder, not specified as traumatic: Secondary | ICD-10-CM | POA: Diagnosis not present

## 2014-07-17 ENCOUNTER — Other Ambulatory Visit: Payer: Self-pay | Admitting: Cardiology

## 2014-07-21 ENCOUNTER — Other Ambulatory Visit: Payer: Self-pay | Admitting: *Deleted

## 2014-07-21 MED ORDER — CLOPIDOGREL BISULFATE 75 MG PO TABS: 75.0000 mg | ORAL_TABLET | Freq: Every day | ORAL | Status: DC

## 2014-08-07 ENCOUNTER — Encounter: Payer: Self-pay | Admitting: Physician Assistant

## 2014-08-07 ENCOUNTER — Ambulatory Visit (INDEPENDENT_AMBULATORY_CARE_PROVIDER_SITE_OTHER): Payer: Commercial Managed Care - PPO | Admitting: Physician Assistant

## 2014-08-07 VITALS — BP 144/76 | HR 96 | Temp 98.1°F | Resp 20 | Wt 287.0 lb

## 2014-08-07 DIAGNOSIS — J988 Other specified respiratory disorders: Secondary | ICD-10-CM | POA: Diagnosis not present

## 2014-08-07 DIAGNOSIS — F32A Depression, unspecified: Secondary | ICD-10-CM

## 2014-08-07 DIAGNOSIS — F418 Other specified anxiety disorders: Secondary | ICD-10-CM

## 2014-08-07 DIAGNOSIS — F419 Anxiety disorder, unspecified: Secondary | ICD-10-CM

## 2014-08-07 DIAGNOSIS — F329 Major depressive disorder, single episode, unspecified: Secondary | ICD-10-CM

## 2014-08-07 DIAGNOSIS — B9689 Other specified bacterial agents as the cause of diseases classified elsewhere: Secondary | ICD-10-CM

## 2014-08-07 MED ORDER — AZITHROMYCIN 250 MG PO TABS: ORAL_TABLET | ORAL | Status: DC

## 2014-08-07 MED ORDER — CLONAZEPAM 1 MG PO TABS: 1.0000 mg | ORAL_TABLET | Freq: Three times a day (TID) | ORAL | Status: DC | PRN

## 2014-08-07 NOTE — Progress Notes (Signed)
Patient ID: Susan [name] MRN: [id number], DOB: 09-Sep-1950, 64 y.o. Date of Encounter: @DATE @  Chief Complaint:  Chief Complaint  Patient presents with  . here for check up/med RF    has been sick x 1 week, congestion, short of breath    HPI: 64 y.o. year old white female  presents with above symptoms.  Says that she has been sick for 7 days now. Chest congestion and cough but also congestion in her head. Taking Mucinex and Tylenol without relief of symptoms.  She sees endocrinologist Dr. Soyla Murphy who manages her diabetes and her thyroid. She also sees cardiologist routinely.  The only chronic medical problem she sees me for is her anxiety and depression. Says that this is stable with her current medications of Paxil and clonazepam. She does have to take the clonazepam 3 times a day on a routine basis. She states that her son is still living with her and is still very difficult to manage.   Past Medical History  Diagnosis Date  . Tachycardia     Sinus tachycardia  . Dyslipidemia   . Hypothyroidism   . CAD (coronary artery disease)     MI in 2000 - MI  2007 - treated bare metal stent (no nuclear since then as 9/11)  . HTN (hypertension)   . Chronic diastolic heart failure     a) ECHO (08/2013) EF 55-60% and RV function nl b) RHC (08/2013) RA 4, RV 30/5/7, PA 25/10 (16), PCWP 7, Fick CO/CI 6.3/2.7, PVR 1.5 WU, PA 61 and 66%  . RBBB (right bundle branch block)     Old  . Urinary incontinence   . Obesity   . Syncope     likely due to low blood sugar  . Carotid artery disease   . Acute MI 1999; 2007  . CHF (congestive heart failure)   . Anxiety   . Depression   . Type II diabetes mellitus   . Peripheral neuropathy   . Venous insufficiency   . Stroke     mini strokes  . PONV (postoperative nausea and vomiting)   . Renal insufficiency     DENIES  . Anemia     hx  . Anginal pain   . Exertional shortness of breath   . Daily headache     "~ every other day; since I fell  in June" (03/15/2014)  . Osteoarthritis   . Arthritis     "generalized" (03/15/2014)     Home Meds: Outpatient Prescriptions Prior to Visit  Medication Sig Dispense Refill  . albuterol (PROVENTIL HFA;VENTOLIN HFA) 108 (90 BASE) MCG/ACT inhaler Inhale 2 puffs into the lungs every 6 (six) hours as needed for wheezing or shortness of breath.    . Ascorbic Acid (VITAMIN C PO) Take 1 tablet by mouth daily. 1000 mg    . aspirin EC 81 MG tablet Take 81 mg by mouth daily.    Marland Kitchen atorvastatin (LIPITOR) 80 MG tablet Take 1 tablet (80 mg total) by mouth daily. 90 tablet 3  . carvedilol (COREG) 12.5 MG tablet Take 1 tablet (12.5 mg total) by mouth 2 (two) times daily with a meal. 60 tablet 5  . Cholecalciferol (VITAMIN D) 2000 UNITS tablet Take 2,000 Units by mouth daily.    . clopidogrel (PLAVIX) 75 MG tablet Take 1 tablet (75 mg total) by mouth daily with breakfast. 30 tablet 3  . Cyanocobalamin (VITAMIN B12 PO) Take 1 tablet by mouth daily. 1000 mg    .  fish oil-omega-3 fatty acids 1000 MG capsule Take 1 g by mouth daily.     . hydrocortisone (ANUSOL-HC) 25 MG suppository Place 1 suppository (25 mg total) rectally 2 (two) times daily. 12 suppository 0  . insulin NPH-regular Human (NOVOLIN 70/30) (70-30) 100 UNIT/ML injection Inject into the skin 3 (three) times daily with meals. 75 units twice a day and 55 with supper    . isosorbide dinitrate (ISORDIL) 10 MG tablet TAKE 1 TABLET (10 MG TOTAL) BY MOUTH 2 (TWO) TIMES DAILY. 60 tablet 1  . levothyroxine (SYNTHROID, LEVOTHROID) 50 MCG tablet Take 50 mcg by mouth daily before breakfast.    . losartan (COZAAR) 50 MG tablet TAKE 1 TABLET BY MOUTH DAILY 30 tablet 5  . metFORMIN (GLUCOPHAGE) 500 MG tablet Take 500 mg by mouth 2 (two) times daily.     . metolazone (ZAROXOLYN) 5 MG tablet Take 5 mg by mouth daily as needed (fluid).    . Multiple Vitamin (MULTIVITAMIN WITH MINERALS) TABS tablet Take 1 tablet by mouth daily.    . nitroGLYCERIN (NITROSTAT) 0.4 MG  SL tablet Place 1 tablet (0.4 mg total) under the tongue every 5 (five) minutes as needed for chest pain. 25 tablet 3  . PARoxetine (PAXIL-CR) 25 MG 24 hr tablet Take 1 tablet (25 mg total) by mouth daily. Taken with 37.5 for a total = 62.5mg  30 tablet 5  . PARoxetine (PAXIL-CR) 37.5 MG 24 hr tablet Take 1 tablet (37.5 mg total) by mouth daily. Taken with 25mg  for a total = 62.5 mg 30 tablet 5  . Potassium Chloride ER 20 MEQ TBCR Take 40 mEq by mouth 2 (two) times daily. 120 tablet 6  . pregabalin (LYRICA) 150 MG capsule Take 150 mg by mouth at bedtime.     . torsemide (DEMADEX) 20 MG tablet Take 2 tablets (40 mg total) by mouth daily. Take 40 mg daily, increase to 80 daily if weight gain or edema    . vitamin B-12 (CYANOCOBALAMIN) 1000 MCG tablet Take 1,000 mcg by mouth daily.    . vitamin E 1000 UNIT capsule Take 1,000 Units by mouth daily.    . clonazePAM (KLONOPIN) 1 MG tablet Take 1 mg by mouth 3 (three) times daily as needed for anxiety.    Marland Kitchen oxyCODONE (OXY IR/ROXICODONE) 5 MG immediate release tablet Take 1-2 tablets (5-10 mg total) by mouth every 3 (three) hours as needed for breakthrough pain. (Patient not taking: Reported on 04/18/2014) 60 tablet 0  . Oxycodone HCl 10 MG TABS Take 10 mg by mouth daily as needed.  0   No facility-administered medications prior to visit.    Allergies:  Allergies  Allergen Reactions  . Codeine Nausea And Vomiting    History   Social History  . Marital Status: Married    Spouse Name: N/A  . Number of Children: 3  . Years of Education: 12th   Occupational History  .      unemploye   Social History Main Topics  . Smoking status: Former Smoker -- 3.00 packs/day for 32 years    Types: Cigarettes    Quit date: 10/24/1997  . Smokeless tobacco: Never Used  . Alcohol Use: Yes     Comment: "might have 2-3 daiquiris in the summer"  . Drug Use: No  . Sexual Activity: No   Other Topics Concern  . Not on file   Social History Narrative   Pt  lives at home with her spouse.   Caffeine Use-  3 sodas daily    Family History  Problem Relation Age of Onset  . Coronary artery disease    . Heart attack Mother      Review of Systems:  See HPI for pertinent ROS. All other ROS negative.    Physical Exam: Blood pressure 144/76, pulse 96, temperature 98.1 F (36.7 C), temperature source Oral, resp. rate 20, weight 287 lb (130.182 kg), SpO2 94 %., Body mass index is 47.76 kg/(m^2). General:Obese WF.  Appears in no acute distress. Head: Normocephalic, atraumatic, eyes without discharge, sclera non-icteric, nares are without discharge. Bilateral auditory canals clear, TM's are without perforation, pearly grey and translucent with reflective cone of light bilaterally. Oral cavity moist, posterior pharynx without exudate, erythema, peritonsillar abscess. No tenderness with percussion of frontal and maxillary sinuses bilaterally.  Neck: Supple. No thyromegaly. No lymphadenopathy. Lungs: Clear bilaterally to auscultation without wheezes, rales, or rhonchi. Breathing is unlabored. Heart: RRR with S1 S2. No murmurs, rubs, or gallops. Musculoskeletal:  Strength and tone normal for age. Extremities/Skin: Warm and dry. Neuro: Alert and oriented X 3. Moves all extremities spontaneously. Gait is normal. CNII-XII grossly in tact. Psych:  Responds to questions appropriately with a normal affect.     ASSESSMENT AND PLAN:  64 y.o. year old female with  1. Bacterial respiratory infection She is to take antibiotic as directed and complete all of it. Recommend that she take Mucinex DM as expectorant. F/U if symptoms do not resolve within 1 week after completion of antibiotic. - azithromycin (ZITHROMAX) 250 MG tablet; Day 1: Take 2 daily.  Days 2-5: Take 1 daily.  Dispense: 6 tablet; Refill: 0  2. Depression Controlled on current dose of Paxil.  3. Anxiety and depression Controlled on current dose of Paxil and clonazepam. Will print refills on  clonazepam's. - clonazePAM (KLONOPIN) 1 MG tablet; Take 1 tablet (1 mg total) by mouth 3 (three) times daily as needed for anxiety.  Dispense: 90 tablet; Refill: 2   Signed, [address] [place], [place], [facility] 08/07/2014 8:18 PM

## 2014-08-11 ENCOUNTER — Telehealth: Payer: Self-pay | Admitting: *Deleted

## 2014-08-11 MED ORDER — PROMETHAZINE HCL 25 MG PO TABS: 25.0000 mg | ORAL_TABLET | Freq: Four times a day (QID) | ORAL | Status: DC | PRN

## 2014-08-11 NOTE — Telephone Encounter (Signed)
Phenergan 25 mg poq 6 hrs prn nausea and ntbs if still sick and blood sugar is 500

## 2014-08-11 NOTE — Telephone Encounter (Signed)
Per Dr. Dennard Schaumann call in Phenergan 25mg  take every 6hrs prn. Script has been called into pt's pharmacy

## 2014-08-11 NOTE — Telephone Encounter (Signed)
Pt was seen Monday March 14 by MBD was given antibiotic azithromycin, pt states she can not keep anything down since given the medication and feels worse then she did, has upset stomach/diarrhea has 1 tablet left , pts BS now is 529 wants to know if you could prescribe her another round of antibiotic and something to help with her stomach upset.Please advise!  [facility] [address]

## 2014-08-17 ENCOUNTER — Telehealth: Payer: Self-pay | Admitting: Family Medicine

## 2014-08-17 NOTE — Telephone Encounter (Signed)
Still very sick.  Still with uncontrol nausea,vomiting, diarrhea.  Sugars up/down between 500-40.  Only able to tolerate crackers and some Gatorade and only if takes phenergan all the time.  Told her with the fluctuating sugars and leg swelling she need to go to ED and let them eval and give her some fluids.  She agrees to go.

## 2014-08-17 NOTE — Telephone Encounter (Signed)
Agree 

## 2014-08-19 ENCOUNTER — Other Ambulatory Visit: Payer: Self-pay | Admitting: Physician Assistant

## 2014-08-19 ENCOUNTER — Other Ambulatory Visit: Payer: Self-pay | Admitting: Cardiology

## 2014-08-21 ENCOUNTER — Other Ambulatory Visit (HOSPITAL_COMMUNITY): Payer: Self-pay

## 2014-08-21 MED ORDER — CLOPIDOGREL BISULFATE 75 MG PO TABS: 75.0000 mg | ORAL_TABLET | Freq: Every day | ORAL | Status: DC

## 2014-08-21 NOTE — Telephone Encounter (Signed)
Medication refilled per protocol. 

## 2014-08-22 ENCOUNTER — Other Ambulatory Visit (HOSPITAL_COMMUNITY): Payer: Self-pay | Admitting: *Deleted

## 2014-09-01 ENCOUNTER — Ambulatory Visit: Payer: Commercial Managed Care - PPO | Admitting: Cardiology

## 2014-09-04 ENCOUNTER — Encounter: Payer: Self-pay | Admitting: Cardiology

## 2014-09-08 ENCOUNTER — Other Ambulatory Visit (HOSPITAL_COMMUNITY): Payer: Self-pay | Admitting: Adult Health

## 2014-09-08 ENCOUNTER — Other Ambulatory Visit: Payer: Self-pay | Admitting: Cardiology

## 2014-09-08 ENCOUNTER — Other Ambulatory Visit: Payer: Self-pay | Admitting: Physician Assistant

## 2014-09-08 NOTE — Telephone Encounter (Signed)
Medication refilled per protocol. 

## 2014-10-09 ENCOUNTER — Encounter: Payer: Self-pay | Admitting: Cardiology

## 2014-10-09 ENCOUNTER — Ambulatory Visit (INDEPENDENT_AMBULATORY_CARE_PROVIDER_SITE_OTHER): Payer: Commercial Managed Care - PPO | Admitting: Cardiology

## 2014-10-09 VITALS — BP 133/74 | HR 79 | Ht 65.0 in | Wt 279.0 lb

## 2014-10-09 DIAGNOSIS — R42 Dizziness and giddiness: Secondary | ICD-10-CM | POA: Diagnosis not present

## 2014-10-09 DIAGNOSIS — I2510 Atherosclerotic heart disease of native coronary artery without angina pectoris: Secondary | ICD-10-CM | POA: Diagnosis not present

## 2014-10-09 DIAGNOSIS — I5042 Chronic combined systolic (congestive) and diastolic (congestive) heart failure: Secondary | ICD-10-CM

## 2014-10-09 NOTE — Assessment & Plan Note (Addendum)
The patient complains of dizziness today. The etiology is not clear. She is not having true syncope. I feel that her symptoms are not related to her rhythm. In addition, very careful orthostatic blood pressure measurements were made today. She was actually dizzy in all positions, lying, sitting, and standing. She did not describe room spinning. There was no drop in her blood pressure and no change in her dizziness related to blood pressure. Overall I feel that her dizziness is not related to her cardiac status. I have encouraged her to follow-up with her primary physician.

## 2014-10-09 NOTE — Assessment & Plan Note (Signed)
Patient had MIs in 2000 and 2007. Her last was treated with a bare metal stent. Nuclear in October, 2015 revealed only a very slight area of ischemia. Her EF is 50%. Her overall cardiac status is stable. No further workup.

## 2014-10-09 NOTE — Patient Instructions (Signed)
**Note De-Identified  Obfuscation** Medication Instructions:  Same  Labwork: None  Testing/Procedures: None  Follow-Up: Your physician wants you to follow-up in: 6 months with Dr Angelena Form. You will receive a reminder letter in the mail two months in advance. If you don't receive a letter, please call our office to schedule the follow-up appointment.

## 2014-10-09 NOTE — Assessment & Plan Note (Signed)
The patient has ongoing problems with volume overload. It is very difficult to assess how hard to push her diuresis. At this point she is on the wet side. I am not changing her diuretics. I encouraged her to watch her salt and fluid intake better and to elevate her feet as frequently as possible.

## 2014-10-09 NOTE — Progress Notes (Signed)
Cardiology Office Note   Date:  10/09/2014   ID:  Susan [name], DOB 02-Apr-1951, MRN [id number]  PCP:  Frazier Richards, PA-C  Cardiologist:  Willa Rough, MD   Chief Complaint  Patient presents with  . Appointment    Follow-up coronary artery disease      History of Present Illness: Susan [name] is a 64 y.o. female who presents today to follow-up coronary artery disease. I saw her last October, 2015. She was going to have shoulder surgery. We decided to screen her with a stress nuclear as she had not had one in many many years. This study revealed no significant ischemia. She had shoulder surgery. Unfortunately was not successful. She still has pain and decreased motion of her right arm at the shoulder.  She is not having any significant chest pain or shortness of breath.  The patient is having marked swings in her glucose. At one point her sugar was as low as 36 and EMS was called.  Independent of low glucose, she says that she has falling spells. After very careful discussion, I feel that these are not cardiac syncopal spells. She says that she is dizzy when standing. Very careful orthostatic blood pressure was done today in the office. There was no drop in her blood pressure with standing. She did become dizzy with sitting and standing on related to her blood pressure or heart rhythm.  The patient is aware that I am retiring at the end of September, 2016. Her husband is followed by Dr. Clifton James. She has requested follow-up with Dr. Clifton James.  This will be arranged.  Past Medical History  Diagnosis Date  . Tachycardia     Sinus tachycardia  . Dyslipidemia   . Hypothyroidism   . CAD (coronary artery disease)     MI in 2000 - MI  2007 - treated bare metal stent (no nuclear since then as 9/11)  . HTN (hypertension)   . Chronic diastolic heart failure     a) ECHO (08/2013) EF 55-60% and RV function nl b) RHC (08/2013) RA 4, RV 30/5/7, PA 25/10 (16), PCWP 7, Fick CO/CI 6.3/2.7, PVR  1.5 WU, PA 61 and 66%  . RBBB (right bundle branch block)     Old  . Urinary incontinence   . Obesity   . Syncope     likely due to low blood sugar  . Carotid artery disease   . Acute MI 1999; 2007  . CHF (congestive heart failure)   . Anxiety   . Depression   . Type II diabetes mellitus   . Peripheral neuropathy   . Venous insufficiency   . Stroke     mini strokes  . PONV (postoperative nausea and vomiting)   . Renal insufficiency     DENIES  . Anemia     hx  . Anginal pain   . Exertional shortness of breath   . Daily headache     "~ every other day; since I fell in June" (03/15/2014)  . Osteoarthritis   . Arthritis     "generalized" (03/15/2014)    Past Surgical History  Procedure Laterality Date  . Knee arthroscopy Left 10/25/2006  . Coronary angioplasty with stent placement  1999; 2007    "1 + 1"  . Abdominal hysterectomy  1980's  . Tubal ligation  1970's  . Cataract extraction, bilateral Bilateral ?2013  . Shoulder arthroscopy w/ rotator cuff repair Right 03/14/2014  . Shoulder arthroscopy with open rotator cuff repair Right  03/14/2014    Procedure: RIGHT SHOULDER ARTHROSCOPY WITH BICEPS RELEASE, OPEN SUBSCAPULA REPAIR, OPEN SUPRASPINATUS REPAIR.;  Surgeon: Cammy Copa, MD;  Location: [facility] OR;  Service: Orthopedics;  Laterality: Right;  . Right heart catheterization N/A 09/22/2013    Procedure: RIGHT HEART CATH;  Surgeon: Dolores Patty, MD;  Location: [facility] CATH LAB;  Service: Cardiovascular;  Laterality: N/A;    Patient Active Problem List   Diagnosis Date Noted  . Non compliance with medical treatment 04/17/2014  . Hyperglycemia 03/30/2014  . DKA (diabetic ketoacidoses) 03/30/2014  . Rotator cuff tear 03/14/2014  . Preop cardiovascular exam 02/17/2014  . Morbid obesity 09/23/2013  . Snoring 08/11/2013  . Chronic combined systolic and diastolic CHF (congestive heart failure) 06/03/2013  . CKD (chronic kidney disease), stage II 06/03/2013  . Carotid  artery disease   . Diabetes mellitus 12/25/2012  . Generalized weakness 12/25/2012  . Anxiety and depression 12/25/2012  . Obesity   . Urinary incontinence   . Renal insufficiency   . Depression 11/12/2010  . Ejection fraction   . RBBB (right bundle branch block)   . Tachycardia   . CAD (coronary artery disease)   . DYSLIPIDEMIA 01/22/2009  . Essential hypertension 01/22/2009  . Shortness of breath 01/22/2009      Current Outpatient Prescriptions  Medication Sig Dispense Refill  . albuterol (PROVENTIL HFA;VENTOLIN HFA) 108 (90 BASE) MCG/ACT inhaler Inhale 2 puffs into the lungs every 6 (six) hours as needed for wheezing or shortness of breath.    . Ascorbic Acid (VITAMIN C PO) Take 1 tablet by mouth daily. 1000 mg    . aspirin EC 81 MG tablet Take 81 mg by mouth daily.    Marland Kitchen atorvastatin (LIPITOR) 80 MG tablet TAKE 1 TABLET DAILY AT 6PM (MUST KEEP APPT WITH DR.Lucilla Petrenko) 30 tablet 3  . azithromycin (ZITHROMAX) 250 MG tablet Day 1: Take 2 daily.  Days 2-5: Take 1 daily. 6 tablet 0  . carvedilol (COREG) 12.5 MG tablet Take 1 tablet (12.5 mg total) by mouth 2 (two) times daily with a meal. 60 tablet 5  . Cholecalciferol (VITAMIN D) 2000 UNITS tablet Take 2,000 Units by mouth daily.    . clonazePAM (KLONOPIN) 1 MG tablet Take 1 tablet (1 mg total) by mouth 3 (three) times daily as needed for anxiety. 90 tablet 2  . clopidogrel (PLAVIX) 75 MG tablet Take 1 tablet (75 mg total) by mouth daily with breakfast. 90 tablet 3  . Cyanocobalamin (VITAMIN B12 PO) Take 1 tablet by mouth daily. 1000 mg    . fish oil-omega-3 fatty acids 1000 MG capsule Take 1 g by mouth daily.     . hydrocortisone (ANUSOL-HC) 25 MG suppository Place 1 suppository (25 mg total) rectally 2 (two) times daily. 12 suppository 0  . insulin NPH-regular Human (NOVOLIN 70/30) (70-30) 100 UNIT/ML injection Inject into the skin 3 (three) times daily with meals. 75 units twice a day and 55 with supper    . isosorbide dinitrate  (ISORDIL) 10 MG tablet TAKE 1 TABLET (10 MG TOTAL) BY MOUTH 2 (TWO) TIMES DAILY. 60 tablet 3  . KLOR-CON M20 20 MEQ tablet TAKE 2 TABLETS BY MOUTH TWICE A DAY 120 tablet 6  . levothyroxine (SYNTHROID, LEVOTHROID) 50 MCG tablet Take 50 mcg by mouth daily before breakfast.    . losartan (COZAAR) 50 MG tablet TAKE 1 TABLET BY MOUTH DAILY 30 tablet 5  . metFORMIN (GLUCOPHAGE) 500 MG tablet Take 500 mg by mouth 2 (two) times  daily.     . Multiple Vitamin (MULTIVITAMIN WITH MINERALS) TABS tablet Take 1 tablet by mouth daily.    . nitroGLYCERIN (NITROSTAT) 0.4 MG SL tablet Place 1 tablet (0.4 mg total) under the tongue every 5 (five) minutes as needed for chest pain. 25 tablet 3  . oxyCODONE (OXY IR/ROXICODONE) 5 MG immediate release tablet Take 1-2 tablets (5-10 mg total) by mouth every 3 (three) hours as needed for breakthrough pain. 60 tablet 0  . Oxycodone HCl 10 MG TABS Take 10 mg by mouth daily as needed.  0  . PARoxetine (PAXIL-CR) 25 MG 24 hr tablet TAKE 1 TABLET (25 MG TOTAL) BY MOUTH DAILY. TAKEN WITH 37.5 FOR A TOTAL = 62.5MG  30 tablet 5  . PARoxetine (PAXIL-CR) 37.5 MG 24 hr tablet Take 1 tablet (37.5 mg total) by mouth daily. Taken with 25mg  for a total = 62.5 mg 30 tablet 5  . pregabalin (LYRICA) 150 MG capsule Take 150 mg by mouth at bedtime.     . promethazine (PHENERGAN) 25 MG tablet Take 1 tablet (25 mg total) by mouth every 6 (six) hours as needed for nausea or vomiting. 30 tablet 0  . torsemide (DEMADEX) 20 MG tablet Take 2 tablets (40 mg total) by mouth daily. Take 40 mg daily, increase to 80 daily if weight gain or edema    . vitamin B-12 (CYANOCOBALAMIN) 1000 MCG tablet Take 1,000 mcg by mouth daily.    . vitamin E 1000 UNIT capsule Take 1,000 Units by mouth daily.     No current facility-administered medications for this visit.    Allergies:   Codeine    Social History:  The patient  reports that she quit smoking about 16 years ago. Her smoking use included Cigarettes. She has  a 96 pack-year smoking history. She has never used smokeless tobacco. She reports that she drinks alcohol. She reports that she does not use illicit drugs.   Family History:  The patient's family history includes Coronary artery disease in an other family member; Heart attack in her mother.    ROS:  Please see the history of present illness.     Patient denies fever, chills, headache, sweats, rash, change in vision, change in hearing, chest pain, cough, nausea or vomiting, urinary symptoms. All other systems are reviewed and are negative.   PHYSICAL EXAM: VS:  BP 133/74 mmHg  Pulse 79  Ht 5\' 5"  (1.651 m)  Wt 279 lb (126.554 kg)  BMI 46.43 kg/m2 , Patient is significantly overweight. Head is atraumatic. Sclera and conjunctiva are normal. There is no jugulovenous distention. Lungs are clear. Respiratory effort is not labored. Cardiac exam reveals an S1 and S2. Abdomen is soft. Patient does have bilateral 1+ pitting edema in her lower extremities. Skin is discolored and her lower legs with venous insufficiency changes.  EKG:   EKG is not done today.   Recent Labs: 03/30/2014: ALT 17; Pro B Natriuretic peptide (BNP) 1392.0* 04/18/2014: BUN 43*; Creatinine 1.18*; Hemoglobin 11.5*; Platelets 296; Potassium 4.4; Sodium 138    Lipid Panel No results found for: CHOL, TRIG, HDL, CHOLHDL, VLDL, LDLCALC, LDLDIRECT    Wt Readings from Last 3 Encounters:  10/09/14 279 lb (126.554 kg)  08/07/14 287 lb (130.182 kg)  04/18/14 272 lb (123.378 kg)      Current medicines are reviewed  The patient understands her medications.     ASSESSMENT AND PLAN:

## 2014-10-10 ENCOUNTER — Other Ambulatory Visit: Payer: Self-pay | Admitting: Cardiology

## 2014-10-25 ENCOUNTER — Other Ambulatory Visit (HOSPITAL_COMMUNITY): Payer: Self-pay | Admitting: Adult Health

## 2014-11-08 ENCOUNTER — Other Ambulatory Visit: Payer: Self-pay | Admitting: Physician Assistant

## 2014-11-08 ENCOUNTER — Other Ambulatory Visit: Payer: Self-pay | Admitting: Cardiology

## 2014-11-09 NOTE — Telephone Encounter (Signed)
Refill appropriate and filled per protocol. 

## 2014-11-15 ENCOUNTER — Encounter (HOSPITAL_COMMUNITY): Payer: Self-pay

## 2014-11-15 ENCOUNTER — Ambulatory Visit (HOSPITAL_COMMUNITY)
Admission: RE | Admit: 2014-11-15 | Discharge: 2014-11-15 | Disposition: A | Discharge status: Home / Self Care | Payer: Commercial Managed Care - PPO | Source: Ambulatory Visit | Attending: Internal Medicine | Admitting: Internal Medicine

## 2014-11-15 VITALS — BP 124/61 | HR 87 | Resp 20 | Wt 260.5 lb

## 2014-11-15 DIAGNOSIS — I10 Essential (primary) hypertension: Secondary | ICD-10-CM | POA: Insufficient documentation

## 2014-11-15 DIAGNOSIS — N182 Chronic kidney disease, stage 2 (mild): Secondary | ICD-10-CM | POA: Diagnosis not present

## 2014-11-15 DIAGNOSIS — I5022 Chronic systolic (congestive) heart failure: Secondary | ICD-10-CM | POA: Diagnosis not present

## 2014-11-15 DIAGNOSIS — I2510 Atherosclerotic heart disease of native coronary artery without angina pectoris: Secondary | ICD-10-CM | POA: Diagnosis not present

## 2014-11-15 DIAGNOSIS — Z87891 Personal history of nicotine dependence: Secondary | ICD-10-CM | POA: Diagnosis not present

## 2014-11-15 DIAGNOSIS — Z794 Long term (current) use of insulin: Secondary | ICD-10-CM | POA: Insufficient documentation

## 2014-11-15 DIAGNOSIS — E669 Obesity, unspecified: Secondary | ICD-10-CM | POA: Insufficient documentation

## 2014-11-15 DIAGNOSIS — E119 Type 2 diabetes mellitus without complications: Secondary | ICD-10-CM | POA: Diagnosis not present

## 2014-11-15 DIAGNOSIS — R0600 Dyspnea, unspecified: Secondary | ICD-10-CM | POA: Insufficient documentation

## 2014-11-15 DIAGNOSIS — I5042 Chronic combined systolic (congestive) and diastolic (congestive) heart failure: Secondary | ICD-10-CM | POA: Diagnosis not present

## 2014-11-15 MED ORDER — METOLAZONE 2.5 MG PO TABS: 2.5000 mg | ORAL_TABLET | Freq: Every day | ORAL | Status: DC | PRN

## 2014-11-15 NOTE — Patient Instructions (Signed)
Take metolazone 2.5mg  tablet once daily AS NEEDED for weight gain and/or edema.  Routine lab work today. Will notify you of abnormal results, otherwise no news is good news!   Follow up 6 months with echocardiogram.  Do the following things EVERYDAY: 1) Weigh yourself in the morning before breakfast. Write it down and keep it in a log. 2) Take your medicines as prescribed 3) Eat low salt foods-Limit salt (sodium) to 2000 mg per day.  4) Stay as active as you can everyday 5) Limit all fluids for the day to less than 2 liters

## 2014-11-15 NOTE — Progress Notes (Signed)
Patient ID: Susan [name], female   DOB: March 30, 1951, 64 y.o.   MRN: [id number]  PCP: Dr Edilia Bo  Cardiologist: Dr Myrtis Ser  Endocrinologist: Dr Lurene Shadow   HPI: Susan [name] is a 64 year old with a history of RBBB, DM, Hypothyroid, diastolic heart failure, CAD MI 2000/2007 BMS 2007, TIA, obesity.   Susan [name] returns for follow up. Has not been seen in over a year. Ongoing dyspnea with exertion. Sleeps in recliner. Denies PND. Weight at home 265 pounds. Having increased edema in legs. Tries to follow low salt diet but drinks tons of water.  Taking all medications.    RHC 09/22/13: RA = 4  RV = 30/5/7  PA = 25/10 (16)  PCW = 7  Fick cardiac output/index = 6.3/2.7  PVR = 1.5 WU   FA sat = 93%  PA sat = 61%, 66%   Labs: 07/18/13 K 4.8 Creatinine 1.38 Glucose 1005 Susan [name] refused to go to [facility] Labs : 08/17/13 K 3.9 Creatinine 1.4 Glucose 499 Labs: 09/29/13 K 4.5 Creatinine 1.4  Labs 04/18/14 K 4.4 Creatinine 1.18  SH: Former smoker quit 15 year ago. Does drink alcohol. Susan [name] is disabled. Lives at home with her husband and disabled son - Husband is truck [employer] doctor. Son has a brain injury after he attempted suicide 2 years ago.    FH: Mom MI  ROS: All systems negative except as listed in HPI, PMH and Problem List.  Past Medical History  Diagnosis Date  . Tachycardia     Sinus tachycardia  . Dyslipidemia   . Hypothyroidism   . CAD (coronary artery disease)     MI in 2000 - MI  2007 - treated bare metal stent (no nuclear since then as 9/11)  . HTN (hypertension)   . Chronic diastolic heart failure     a) ECHO (08/2013) EF 55-60% and RV function nl b) RHC (08/2013) RA 4, RV 30/5/7, PA 25/10 (16), PCWP 7, Fick CO/CI 6.3/2.7, PVR 1.5 WU, PA 61 and 66%  . RBBB (right bundle branch block)     Old  . Urinary incontinence   . Obesity   . Syncope     likely due to low blood sugar  . Carotid artery disease   . Acute MI 1999; 2007  . CHF (congestive heart failure)   . Anxiety   . Depression   . Type  II diabetes mellitus   . Peripheral neuropathy   . Venous insufficiency   . Stroke     mini strokes  . PONV (postoperative nausea and vomiting)   . Renal insufficiency     DENIES  . Anemia     hx  . Anginal pain   . Exertional shortness of breath   . Daily headache     "~ every other day; since I fell in June" (03/15/2014)  . Osteoarthritis   . Arthritis     "generalized" (03/15/2014)    Current Outpatient Prescriptions  Medication Sig Dispense Refill  . albuterol (PROVENTIL HFA;VENTOLIN HFA) 108 (90 BASE) MCG/ACT inhaler Inhale 2 puffs into the lungs every 6 (six) hours as needed for wheezing or shortness of breath.    . Ascorbic Acid (VITAMIN C PO) Take 1 tablet by mouth daily. 1000 mg    . aspirin EC 81 MG tablet Take 81 mg by mouth daily.    Marland Kitchen atorvastatin (LIPITOR) 80 MG tablet TAKE 1 TABLET DAILY AT 6PM (MUST KEEP APPT WITH DR.KATZ) 30 tablet 3  . carvedilol (COREG) 12.5  MG tablet TAKE 1 TABLET (12.5 MG TOTAL) BY MOUTH 2 (TWO) TIMES DAILY WITH A MEAL. 60 tablet 5  . Cholecalciferol (VITAMIN D) 2000 UNITS tablet Take 2,000 Units by mouth daily.    . clonazePAM (KLONOPIN) 1 MG tablet Take 1 tablet (1 mg total) by mouth 3 (three) times daily as needed for anxiety. 90 tablet 2  . clopidogrel (PLAVIX) 75 MG tablet Take 1 tablet (75 mg total) by mouth daily with breakfast. 90 tablet 3  . fish oil-omega-3 fatty acids 1000 MG capsule Take 1 g by mouth daily.     . hydrocortisone (ANUSOL-HC) 25 MG suppository Place 1 suppository (25 mg total) rectally 2 (two) times daily. 12 suppository 0  . insulin NPH-regular Human (NOVOLIN 70/30) (70-30) 100 UNIT/ML injection Inject into the skin 3 (three) times daily with meals. 75 units twice a day and 55 with supper    . isosorbide dinitrate (ISORDIL) 10 MG tablet TAKE 1 TABLET (10 MG TOTAL) BY MOUTH 2 (TWO) TIMES DAILY. 60 tablet 3  . KLOR-CON M20 20 MEQ tablet TAKE 2 TABLETS BY MOUTH TWICE A DAY 120 tablet 6  . levothyroxine (SYNTHROID,  LEVOTHROID) 50 MCG tablet Take 50 mcg by mouth daily before breakfast.    . losartan (COZAAR) 50 MG tablet TAKE 1 TABLET BY MOUTH DAILY 30 tablet 5  . metFORMIN (GLUCOPHAGE) 500 MG tablet Take 500 mg by mouth 2 (two) times daily.     . Multiple Vitamin (MULTIVITAMIN WITH MINERALS) TABS tablet Take 1 tablet by mouth daily.    . nitroGLYCERIN (NITROSTAT) 0.4 MG SL tablet Place 1 tablet (0.4 mg total) under the tongue every 5 (five) minutes as needed for chest pain. 25 tablet 3  . oxyCODONE (OXY IR/ROXICODONE) 5 MG immediate release tablet Take 1-2 tablets (5-10 mg total) by mouth every 3 (three) hours as needed for breakthrough pain. 60 tablet 0  . PARoxetine (PAXIL-CR) 25 MG 24 hr tablet TAKE 1 TABLET (25 MG TOTAL) BY MOUTH DAILY. TAKEN WITH 37.5 FOR A TOTAL = 62.5MG  30 tablet 5  . PARoxetine (PAXIL-CR) 37.5 MG 24 hr tablet TAKE 1 TABLET (37.5 MG TOTAL) BY MOUTH DAILY. TAKEN WITH 25MG  FOR A TOTAL = 62.5 MG 30 tablet 5  . pregabalin (LYRICA) 150 MG capsule Take 150 mg by mouth at bedtime.     . promethazine (PHENERGAN) 25 MG tablet Take 1 tablet (25 mg total) by mouth every 6 (six) hours as needed for nausea or vomiting. 30 tablet 0  . torsemide (DEMADEX) 20 MG tablet Take 4 tabs in am and and 2 tabs in pm.  NO MORE REFILLS REMAINING UNTIL YOU MAKE APT WITH HF CLINIC. 180 tablet 0  . vitamin B-12 (CYANOCOBALAMIN) 1000 MCG tablet Take 1,000 mcg by mouth daily.    . vitamin E 1000 UNIT capsule Take 1,000 Units by mouth daily.     No current facility-administered medications for this encounter.    Filed Vitals:   11/15/14 1352  BP: 124/61  Pulse: 87  Resp: 20  Weight: 260 lb 8 oz (118.162 kg)  SpO2: 92%   PHYSICAL EXAM: General:  NAD;  HEENT: normal Neck: supple. JVP difficult to assess d/t body habitus but does appear elevated.  Carotids 2+ bilaterally; no bruits. No lymphadenopathy or thryomegaly appreciated. Cor: PMI normal. Regular rate & rhythm. No rubs, gallops or murmurs. Lungs:  clear Abdomen: obese, soft, nontender, nondistended. No hepatosplenomegaly. No bruits or masses. Good bowel sounds. Extremities: no cyanosis, clubbing, rash, R  and LLE 2-3+ edema with erythema noted LLE with blisters noted on posterior aspect. Neuro: alert & orientedx3, cranial nerves grossly intact. Moves all 4 extremities w/o difficulty. Affect pleasant.  EKG: NST  84 bpm   ASSESSMENT & PLAN:   1. Chronic Systolic Heart Failure: NICM, EF 55-60% (08/2013)  -- NYHA III symptoms and volume status mildly elevated.  Will continue torsemide 80 mg daily and add 40 mg in afternoon. Will reorder metolazone as needed.  I have asked her not to take  Metolazone more than once a week.  - Continue coreg 12.5 mg BID and losartan 50 mg daily. Repeat ECHO next visit. Check BMET today  - Reinforced the need and importance of daily weights, a low sodium diet, and fluid restriction (less than 2 L a day). Instructed to call the HF clinic if weight increases more than 3 lbs overnight or 5 lbs in a week 2.. Dyspnea- Ongoing dyspnea with exertion. Offered sleep study again but Susan [name] refuses. .  3. Uncontrolled DM: Blood sugars at home better controlled. Insulin adjusted per Dr Lurene Shadow.  4. CAD- no evidence of ischemia- on aspirin and statin.  5. Obesity: Discussed portion control the importance of weight loss. Dr Lurene Shadow is referring her to dietitian.  6. HTN- elevated. Continue current regimen and increase diuretic regimen  Follow up 6 months with an ECHO.    Wilmon Conover NP-C  2:01 PM

## 2014-12-11 ENCOUNTER — Other Ambulatory Visit (HOSPITAL_COMMUNITY): Payer: Self-pay | Admitting: Adult Health

## 2014-12-21 ENCOUNTER — Encounter (HOSPITAL_BASED_OUTPATIENT_CLINIC_OR_DEPARTMENT_OTHER): Payer: Commercial Managed Care - PPO | Attending: Internal Medicine

## 2014-12-21 DIAGNOSIS — I10 Essential (primary) hypertension: Secondary | ICD-10-CM | POA: Insufficient documentation

## 2014-12-21 DIAGNOSIS — E1140 Type 2 diabetes mellitus with diabetic neuropathy, unspecified: Secondary | ICD-10-CM | POA: Diagnosis not present

## 2014-12-21 DIAGNOSIS — L97821 Non-pressure chronic ulcer of other part of left lower leg limited to breakdown of skin: Secondary | ICD-10-CM | POA: Insufficient documentation

## 2014-12-21 DIAGNOSIS — M1990 Unspecified osteoarthritis, unspecified site: Secondary | ICD-10-CM | POA: Diagnosis not present

## 2014-12-21 DIAGNOSIS — L97811 Non-pressure chronic ulcer of other part of right lower leg limited to breakdown of skin: Secondary | ICD-10-CM | POA: Diagnosis not present

## 2014-12-21 DIAGNOSIS — I87323 Chronic venous hypertension (idiopathic) with inflammation of bilateral lower extremity: Secondary | ICD-10-CM | POA: Insufficient documentation

## 2014-12-21 DIAGNOSIS — E039 Hypothyroidism, unspecified: Secondary | ICD-10-CM | POA: Diagnosis not present

## 2014-12-21 DIAGNOSIS — Z833 Family history of diabetes mellitus: Secondary | ICD-10-CM | POA: Diagnosis not present

## 2014-12-21 DIAGNOSIS — E11622 Type 2 diabetes mellitus with other skin ulcer: Secondary | ICD-10-CM | POA: Insufficient documentation

## 2014-12-21 DIAGNOSIS — I2510 Atherosclerotic heart disease of native coronary artery without angina pectoris: Secondary | ICD-10-CM | POA: Diagnosis not present

## 2014-12-21 DIAGNOSIS — I509 Heart failure, unspecified: Secondary | ICD-10-CM | POA: Diagnosis not present

## 2014-12-21 DIAGNOSIS — Z87891 Personal history of nicotine dependence: Secondary | ICD-10-CM | POA: Diagnosis not present

## 2014-12-21 DIAGNOSIS — Z6841 Body Mass Index (BMI) 40.0 and over, adult: Secondary | ICD-10-CM | POA: Insufficient documentation

## 2014-12-21 DIAGNOSIS — E1165 Type 2 diabetes mellitus with hyperglycemia: Secondary | ICD-10-CM | POA: Diagnosis not present

## 2014-12-21 DIAGNOSIS — Z794 Long term (current) use of insulin: Secondary | ICD-10-CM | POA: Diagnosis not present

## 2014-12-21 DIAGNOSIS — Z955 Presence of coronary angioplasty implant and graft: Secondary | ICD-10-CM | POA: Insufficient documentation

## 2014-12-21 DIAGNOSIS — I252 Old myocardial infarction: Secondary | ICD-10-CM | POA: Diagnosis not present

## 2014-12-21 DIAGNOSIS — R809 Proteinuria, unspecified: Secondary | ICD-10-CM | POA: Diagnosis not present

## 2014-12-21 DIAGNOSIS — E785 Hyperlipidemia, unspecified: Secondary | ICD-10-CM | POA: Insufficient documentation

## 2014-12-21 DIAGNOSIS — G609 Hereditary and idiopathic neuropathy, unspecified: Secondary | ICD-10-CM | POA: Diagnosis not present

## 2014-12-28 ENCOUNTER — Encounter (HOSPITAL_BASED_OUTPATIENT_CLINIC_OR_DEPARTMENT_OTHER): Payer: Commercial Managed Care - PPO | Attending: Internal Medicine

## 2014-12-28 DIAGNOSIS — E11622 Type 2 diabetes mellitus with other skin ulcer: Secondary | ICD-10-CM | POA: Insufficient documentation

## 2014-12-28 DIAGNOSIS — I739 Peripheral vascular disease, unspecified: Secondary | ICD-10-CM | POA: Insufficient documentation

## 2014-12-28 DIAGNOSIS — I509 Heart failure, unspecified: Secondary | ICD-10-CM | POA: Insufficient documentation

## 2014-12-28 DIAGNOSIS — I252 Old myocardial infarction: Secondary | ICD-10-CM | POA: Diagnosis not present

## 2014-12-28 DIAGNOSIS — L97211 Non-pressure chronic ulcer of right calf limited to breakdown of skin: Secondary | ICD-10-CM | POA: Insufficient documentation

## 2014-12-28 DIAGNOSIS — I2510 Atherosclerotic heart disease of native coronary artery without angina pectoris: Secondary | ICD-10-CM | POA: Diagnosis not present

## 2014-12-28 DIAGNOSIS — I10 Essential (primary) hypertension: Secondary | ICD-10-CM | POA: Insufficient documentation

## 2014-12-28 DIAGNOSIS — F419 Anxiety disorder, unspecified: Secondary | ICD-10-CM | POA: Diagnosis not present

## 2014-12-28 DIAGNOSIS — M1990 Unspecified osteoarthritis, unspecified site: Secondary | ICD-10-CM | POA: Insufficient documentation

## 2014-12-28 DIAGNOSIS — L97221 Non-pressure chronic ulcer of left calf limited to breakdown of skin: Secondary | ICD-10-CM | POA: Diagnosis not present

## 2014-12-28 DIAGNOSIS — E1142 Type 2 diabetes mellitus with diabetic polyneuropathy: Secondary | ICD-10-CM | POA: Diagnosis not present

## 2015-01-04 DIAGNOSIS — L97211 Non-pressure chronic ulcer of right calf limited to breakdown of skin: Secondary | ICD-10-CM | POA: Diagnosis not present

## 2015-01-04 DIAGNOSIS — E1142 Type 2 diabetes mellitus with diabetic polyneuropathy: Secondary | ICD-10-CM | POA: Diagnosis not present

## 2015-01-04 DIAGNOSIS — I509 Heart failure, unspecified: Secondary | ICD-10-CM | POA: Diagnosis not present

## 2015-01-04 DIAGNOSIS — L97221 Non-pressure chronic ulcer of left calf limited to breakdown of skin: Secondary | ICD-10-CM | POA: Diagnosis not present

## 2015-01-04 DIAGNOSIS — I10 Essential (primary) hypertension: Secondary | ICD-10-CM | POA: Diagnosis not present

## 2015-01-04 DIAGNOSIS — E11622 Type 2 diabetes mellitus with other skin ulcer: Secondary | ICD-10-CM | POA: Diagnosis not present

## 2015-01-24 ENCOUNTER — Other Ambulatory Visit: Payer: Self-pay | Admitting: Physician Assistant

## 2015-01-25 NOTE — Telephone Encounter (Signed)
Ok to refill??  Last office visit/ refill 08/07/2014, #2 refills.

## 2015-01-25 NOTE — Telephone Encounter (Signed)
Approved. # 90 + 2. 

## 2015-01-25 NOTE — Telephone Encounter (Signed)
Medication refilled per protocol. 

## 2015-02-27 ENCOUNTER — Other Ambulatory Visit: Payer: Self-pay | Admitting: Cardiology

## 2015-03-08 ENCOUNTER — Encounter: Payer: Self-pay | Admitting: Physician Assistant

## 2015-03-08 ENCOUNTER — Ambulatory Visit (INDEPENDENT_AMBULATORY_CARE_PROVIDER_SITE_OTHER): Payer: Commercial Managed Care - PPO | Admitting: Physician Assistant

## 2015-03-08 VITALS — BP 122/72 | HR 80 | Temp 97.3°F | Resp 20 | Wt 281.0 lb

## 2015-03-08 DIAGNOSIS — N182 Chronic kidney disease, stage 2 (mild): Secondary | ICD-10-CM

## 2015-03-08 DIAGNOSIS — A09 Infectious gastroenteritis and colitis, unspecified: Secondary | ICD-10-CM | POA: Diagnosis not present

## 2015-03-08 DIAGNOSIS — E118 Type 2 diabetes mellitus with unspecified complications: Secondary | ICD-10-CM

## 2015-03-08 DIAGNOSIS — R112 Nausea with vomiting, unspecified: Secondary | ICD-10-CM | POA: Diagnosis not present

## 2015-03-08 DIAGNOSIS — R197 Diarrhea, unspecified: Secondary | ICD-10-CM

## 2015-03-08 DIAGNOSIS — N289 Disorder of kidney and ureter, unspecified: Secondary | ICD-10-CM

## 2015-03-08 DIAGNOSIS — Z794 Long term (current) use of insulin: Secondary | ICD-10-CM

## 2015-03-08 LAB — CBC W/MCH & 3 PART DIFF
HCT: 35.4 % — ABNORMAL LOW (ref 36.0–46.0)
Hemoglobin: 11.3 g/dL — ABNORMAL LOW (ref 12.0–15.0)
Lymphocytes Relative: 10 % — ABNORMAL LOW (ref 12–46)
Lymphs Abs: 0.8 10*3/uL (ref 0.7–4.0)
MCH: 24.6 pg — ABNORMAL LOW (ref 26.0–34.0)
MCHC: 31.9 g/dL (ref 30.0–36.0)
MCV: 77.1 fL — ABNORMAL LOW (ref 78.0–100.0)
Neutro Abs: 6.9 10*3/uL (ref 1.7–7.7)
Neutrophils Relative %: 82 % — ABNORMAL HIGH (ref 43–77)
Platelets: 200 10*3/uL (ref 150–400)
RBC: 4.59 MIL/uL (ref 3.87–5.11)
RDW: 16.9 % — ABNORMAL HIGH (ref 11.5–15.5)
WBC mixed population %: 8 % (ref 3–18)
WBC mixed population: 0.7 10*3/uL (ref 0.1–1.8)
WBC: 8.4 10*3/uL (ref 4.0–10.5)

## 2015-03-08 LAB — COMPLETE METABOLIC PANEL WITH GFR
ALT: 18 U/L (ref 6–29)
AST: 28 U/L (ref 10–35)
Albumin: 3.4 g/dL — ABNORMAL LOW (ref 3.6–5.1)
Alkaline Phosphatase: 130 U/L (ref 33–130)
BUN: 28 mg/dL — ABNORMAL HIGH (ref 7–25)
CO2: 20 mmol/L (ref 20–31)
Calcium: 8.8 mg/dL (ref 8.6–10.4)
Chloride: 100 mmol/L (ref 98–110)
Creat: 1.37 mg/dL — ABNORMAL HIGH (ref 0.50–0.99)
GFR, Est African American: 47 mL/min — ABNORMAL LOW (ref >=60)
GFR, Est Non African American: 41 mL/min — ABNORMAL LOW (ref >=60)
Glucose, Bld: 262 mg/dL — ABNORMAL HIGH (ref 70–99)
Potassium: 4.2 mmol/L (ref 3.5–5.3)
Sodium: 136 mmol/L (ref 135–146)
Total Bilirubin: 0.6 mg/dL (ref 0.2–1.2)
Total Protein: 6.5 g/dL (ref 6.1–8.1)

## 2015-03-08 LAB — GLUCOSE, FINGERSTICK (STAT): Glucose, fingerstick: 297 mg/dL — ABNORMAL HIGH (ref 70–99)

## 2015-03-08 MED ORDER — PROMETHAZINE HCL 25 MG/ML IJ SOLN
25.0000 mg | Freq: Once | INTRAMUSCULAR | Status: AC
  Administered 2015-03-08: 25 mg via INTRAVENOUS

## 2015-03-08 MED ORDER — PROMETHAZINE HCL 25 MG PO TABS: 25.0000 mg | ORAL_TABLET | Freq: Four times a day (QID) | ORAL | Status: DC | PRN

## 2015-03-08 NOTE — Progress Notes (Signed)
Patient ID: Susan [name] MRN: [id number], DOB: [birth year], 64 y.o. Date of Encounter: @DATE @  Chief Complaint:  Chief Complaint  Patient presents with  . sick x 1 week    n/v/d, chills, hasn't kept oral meds down    HPI: 64 y.o. year old female  presents with above symptoms.   Says that these symptoms started Friday night and she woke up Friday night sick. Has been in bed since. Says that she had vomiting Friday night and Saturday but since Saturday has been having dry heaves and no further vomiting. She is also having diarrhea. Says that the first couple of days probably 4-5 times per day but now about 2 per day. However all that she has been taking in has been:  2 cans of Sprite Zero per day and one Saltine Cracker.   She has not checked her temperature at home to know whether she has had any fever there.  She states that she has taken no oral medication since Saturday morning as she can't keep anything down. Says that she is doing her insulin and that she has been calling Dr. Bubba Camp every day regarding her blood sugars and insulin dosing. If gets blood sugar greater than 200, then she does 20 units twice a day.  Says most of her readings have been around 256. Has had no low readings at all.  I reviewed that I see Phenergan on her medication list. However, she is not aware of any medication for nausea being available to her to use. She has not been taking any Phenergan.  Says that she "has even made her 3 dogs sick"----says that they are now throwing up as well.  She has had no localized/focal abdominal pain.   Past Medical History  Diagnosis Date  . Tachycardia     Sinus tachycardia  . Dyslipidemia   . Hypothyroidism   . CAD (coronary artery disease)     MI in 2000 - MI  2007 - treated bare metal stent (no nuclear since then as 9/11)  . HTN (hypertension)   . Chronic diastolic heart failure (HCC)     a) ECHO (08/2013) EF 55-60% and RV function nl b) RHC (08/2013) RA 4,  RV 30/5/7, PA 25/10 (16), PCWP 7, Fick CO/CI 6.3/2.7, PVR 1.5 WU, PA 61 and 66%  . RBBB (right bundle branch block)     Old  . Urinary incontinence   . Obesity   . Syncope     likely due to low blood sugar  . Carotid artery disease ([place])   . Acute MI ([place]) 1999; 2007  . CHF (congestive heart failure) ([place])   . Anxiety   . Depression   . Type II diabetes mellitus ([place])   . Peripheral neuropathy ([place])   . Venous insufficiency   . Stroke [facility])     mini strokes  . PONV (postoperative nausea and vomiting)   . Renal insufficiency     DENIES  . Anemia     hx  . Anginal pain ([place])   . Exertional shortness of breath   . Daily headache     "~ every other day; since I fell in June" (03/15/2014)  . Osteoarthritis   . Arthritis     "generalized" (03/15/2014)     Home Meds: Outpatient Prescriptions Prior to Visit  Medication Sig Dispense Refill  . albuterol (PROVENTIL HFA;VENTOLIN HFA) 108 (90 BASE) MCG/ACT inhaler Inhale 2 puffs into the lungs every 6 (six) hours as needed  for wheezing or shortness of breath.    . Ascorbic Acid (VITAMIN C PO) Take 1 tablet by mouth daily. 1000 mg    . aspirin EC 81 MG tablet Take 81 mg by mouth daily.    Marland Kitchen atorvastatin (LIPITOR) 80 MG tablet TAKE 1 TABLET DAILY AT 6PM (MUST KEEP APPT WITH DR.KATZ) 30 tablet 3  . carvedilol (COREG) 12.5 MG tablet TAKE 1 TABLET (12.5 MG TOTAL) BY MOUTH 2 (TWO) TIMES DAILY WITH A MEAL. 60 tablet 5  . Cholecalciferol (VITAMIN D) 2000 UNITS tablet Take 2,000 Units by mouth daily.    . clonazePAM (KLONOPIN) 1 MG tablet TAKE 1 TABLET BY MOUTH 3 TIMES A DAY AS NEEDED 90 tablet 2  . clopidogrel (PLAVIX) 75 MG tablet Take 1 tablet (75 mg total) by mouth daily with breakfast. 90 tablet 3  . fish oil-omega-3 fatty acids 1000 MG capsule Take 1 g by mouth daily.     . insulin NPH-regular Human (NOVOLIN 70/30) (70-30) 100 UNIT/ML injection Inject into the skin 3 (three) times daily with meals. 75 units twice a day and 55 with supper     . isosorbide dinitrate (ISORDIL) 10 MG tablet TAKE 1 TABLET (10 MG TOTAL) BY MOUTH 2 (TWO) TIMES DAILY. 60 tablet 3  . KLOR-CON M20 20 MEQ tablet TAKE 2 TABLETS BY MOUTH TWICE A DAY 120 tablet 6  . levothyroxine (SYNTHROID, LEVOTHROID) 50 MCG tablet Take 50 mcg by mouth daily before breakfast.    . losartan (COZAAR) 50 MG tablet TAKE 1 TABLET BY MOUTH DAILY 30 tablet 5  . metFORMIN (GLUCOPHAGE) 500 MG tablet Take 500 mg by mouth 2 (two) times daily.     . metolazone (ZAROXOLYN) 2.5 MG tablet Take 1 tablet (2.5 mg total) by mouth daily as needed (For edema/weight gain.). 30 tablet 6  . Multiple Vitamin (MULTIVITAMIN WITH MINERALS) TABS tablet Take 1 tablet by mouth daily.    . nitroGLYCERIN (NITROSTAT) 0.4 MG SL tablet Place 1 tablet (0.4 mg total) under the tongue every 5 (five) minutes as needed for chest pain. 25 tablet 3  . PARoxetine (PAXIL-CR) 25 MG 24 hr tablet TAKE 1 TABLET (25 MG TOTAL) BY MOUTH DAILY. TAKEN WITH 37.5 FOR A TOTAL = 62.5MG  30 tablet 5  . PARoxetine (PAXIL-CR) 37.5 MG 24 hr tablet TAKE 1 TABLET (37.5 MG TOTAL) BY MOUTH DAILY. TAKEN WITH 25MG  FOR A TOTAL = 62.5 MG 30 tablet 5  . pregabalin (LYRICA) 150 MG capsule Take 150 mg by mouth at bedtime.     . promethazine (PHENERGAN) 25 MG tablet Take 1 tablet (25 mg total) by mouth every 6 (six) hours as needed for nausea or vomiting. 30 tablet 0  . torsemide (DEMADEX) 20 MG tablet TAKE 4 TABLETS BY MOUTH IN THE MORNING AND 2 TABLETS IN THE EVENING....NO MORE REFILLS MAKE APPT. 180 tablet 6  . vitamin B-12 (CYANOCOBALAMIN) 1000 MCG tablet Take 1,000 mcg by mouth daily.    . vitamin E 1000 UNIT capsule Take 1,000 Units by mouth daily.    . hydrocortisone (ANUSOL-HC) 25 MG suppository Place 1 suppository (25 mg total) rectally 2 (two) times daily. 12 suppository 0  . oxyCODONE (OXY IR/ROXICODONE) 5 MG immediate release tablet Take 1-2 tablets (5-10 mg total) by mouth every 3 (three) hours as needed for breakthrough pain. (Patient not  taking: Reported on 03/08/2015) 60 tablet 0   No facility-administered medications prior to visit.    Allergies:  Allergies  Allergen Reactions  . Codeine  Nausea And Vomiting    Social History   Social History  . Marital Status: Married    Spouse Name: N/A  . Number of Children: 3  . Years of Education: 12th   Occupational History  .      unemploye   Social History Main Topics  . Smoking status: Former Smoker -- 3.00 packs/day for 32 years    Types: Cigarettes    Quit date: 10/24/1997  . Smokeless tobacco: Never Used  . Alcohol Use: Yes     Comment: "might have 2-3 daiquiris in the summer"  . Drug Use: No  . Sexual Activity: No   Other Topics Concern  . Not on file   Social History Narrative   Pt lives at home with her spouse.   Caffeine Use- 3 sodas daily    Family History  Problem Relation Age of Onset  . Coronary artery disease    . Heart attack Mother      Review of Systems:  See HPI for pertinent ROS. All other ROS negative.    Physical Exam: Blood pressure 122/72, pulse 80, temperature 97.3 F (36.3 C), temperature source Oral, resp. rate 20, weight 281 lb (127.461 kg)., Body mass index is 46.76 kg/(m^2). General: Obese WF. Looks weak but not acutely ill. Appears in no acute distress. Neck: Supple. No thyromegaly. No lymphadenopathy. Lungs: Clear bilaterally to auscultation without wheezes, rales, or rhonchi. Breathing is unlabored. Heart: RRR with S1 S2. No murmurs, rubs, or gallops. Abdomen: Soft, non-tender, non-distended with normoactive bowel sounds. No hepatomegaly. No rebound/guarding. No obvious abdominal masses. Musculoskeletal:  Strength and tone normal for age. Extremities/Skin: Warm and dry. Neuro: Alert and oriented X 3. Moves all extremities spontaneously. Gait is normal. CNII-XII grossly in tact. Psych:  Responds to questions appropriately with a normal affect.   Results for orders placed or performed in visit on 03/08/15  CBC w/MCH  & 3 Part Diff  Result Value Ref Range   WBC 8.4 4.0 - 10.5 K/uL   RBC 4.59 3.87 - 5.11 MIL/uL   Hemoglobin 11.3 (L) 12.0 - 15.0 g/dL   HCT 35.4 (L) 36.0 - 46.0 %   MCV 77.1 (L) 78.0 - 100.0 fL   MCH 24.6 (L) 26.0 - 34.0 pg   MCHC 31.9 30.0 - 36.0 g/dL   RDW 16.9 (H) 11.5 - 15.5 %   Platelets 200 150 - 400 K/uL   Neutrophils Relative % 82 (H) 43 - 77 %   Neutro Abs 6.9 1.7 - 7.7 K/uL   Lymphocytes Relative 10 (L) 12 - 46 %   Lymphs Abs 0.8 0.7 - 4.0 K/uL   WBC mixed population % 8 3 - 18 %   WBC mixed population 0.7 0.1 - 1.8 K/uL  Glucose, fingerstick (stat)  Result Value Ref Range   Glucose, fingerstick 297 (H) 70 - 99 mg/dL     ASSESSMENT AND PLAN:  64 y.o. year old female with  1. Nausea and vomiting, intractability of vomiting not specified, unspecified vomiting type - CBC w/MCH & 3 Part Diff - Glucose, fingerstick (stat) - COMPLETE METABOLIC PANEL WITH GFR - Clostridium difficile EIA - Ova and parasite examination - Stool culture; Future  2. Diarrhea of presumed infectious origin - CBC w/MCH & 3 Part Diff - Glucose, fingerstick (stat) - COMPLETE METABOLIC PANEL WITH GFR - Clostridium difficile EIA - Ova and parasite examination - Stool culture; Future  3. Renal insufficiency - COMPLETE METABOLIC PANEL WITH GFR  4. Type 2  diabetes mellitus with complication, with long-term current use of insulin (HCC) - COMPLETE METABOLIC PANEL WITH GFR  5. CKD (chronic kidney disease), stage II - COMPLETE METABOLIC PANEL WITH GFR  6. Morbid obesity, unspecified obesity type ([place]) - COMPLETE METABOLIC PANEL WITH GFR  Reviewed CBC and blood sugar while pt here in the office. Regarding anemia, this is stable. Her last prior CBC had shown H/H 11.5/35.5.  Gave 2 bags IV fluids here in the office. Gave Phenergan 25 mg IV.  Will send prescription for oral Phenergan to use at home as needed. Will follow-up on other lab results as they are available.  In the interim, she is to  take the Phenergan on a routine basis, continue to force sugar-free fluids as much as possible, and stick with liquid diet and eventually to bland diet as tolerated. Follow-up if develops fever or focal abdominal pain or if the symptoms worsen or are not resolving in the near future.   [address] [place], [place], [facility] 03/08/2015 12:16 PM

## 2015-03-16 ENCOUNTER — Ambulatory Visit (INDEPENDENT_AMBULATORY_CARE_PROVIDER_SITE_OTHER): Payer: Commercial Managed Care - PPO | Admitting: Family Medicine

## 2015-03-16 ENCOUNTER — Encounter: Payer: Self-pay | Admitting: Family Medicine

## 2015-03-16 VITALS — BP 132/72 | HR 78 | Temp 97.9°F | Resp 18 | Ht 65.0 in | Wt 276.0 lb

## 2015-03-16 DIAGNOSIS — I2510 Atherosclerotic heart disease of native coronary artery without angina pectoris: Secondary | ICD-10-CM

## 2015-03-16 DIAGNOSIS — A09 Infectious gastroenteritis and colitis, unspecified: Secondary | ICD-10-CM | POA: Diagnosis not present

## 2015-03-16 DIAGNOSIS — R112 Nausea with vomiting, unspecified: Secondary | ICD-10-CM | POA: Diagnosis not present

## 2015-03-16 DIAGNOSIS — R197 Diarrhea, unspecified: Secondary | ICD-10-CM

## 2015-03-16 LAB — COMPREHENSIVE METABOLIC PANEL
ALT: 15 U/L (ref 6–29)
AST: 17 U/L (ref 10–35)
Albumin: 3.1 g/dL — ABNORMAL LOW (ref 3.6–5.1)
Alkaline Phosphatase: 126 U/L (ref 33–130)
BUN: 30 mg/dL — ABNORMAL HIGH (ref 7–25)
CO2: 20 mmol/L (ref 20–31)
Calcium: 8.7 mg/dL (ref 8.6–10.4)
Chloride: 107 mmol/L (ref 98–110)
Creat: 1.07 mg/dL — ABNORMAL HIGH (ref 0.50–0.99)
Glucose, Bld: 225 mg/dL — ABNORMAL HIGH (ref 70–99)
Potassium: 4.3 mmol/L (ref 3.5–5.3)
Sodium: 139 mmol/L (ref 135–146)
Total Bilirubin: 0.6 mg/dL (ref 0.2–1.2)
Total Protein: 6.1 g/dL (ref 6.1–8.1)

## 2015-03-16 LAB — CBC W/MCH & 3 PART DIFF
HCT: 34.7 % — ABNORMAL LOW (ref 36.0–46.0)
Hemoglobin: 10.8 g/dL — ABNORMAL LOW (ref 12.0–15.0)
Lymphocytes Relative: 10 % — ABNORMAL LOW (ref 12–46)
Lymphs Abs: 0.7 10*3/uL (ref 0.7–4.0)
MCH: 24.3 pg — ABNORMAL LOW (ref 26.0–34.0)
MCHC: 31.1 g/dL (ref 30.0–36.0)
MCV: 78.2 fL (ref 78.0–100.0)
Neutro Abs: 5.3 10*3/uL (ref 1.7–7.7)
Neutrophils Relative %: 81 % — ABNORMAL HIGH (ref 43–77)
Platelets: 271 10*3/uL (ref 150–400)
RBC: 4.44 MIL/uL (ref 3.87–5.11)
RDW: 16.8 % — ABNORMAL HIGH (ref 11.5–15.5)
WBC mixed population %: 9 % (ref 3–18)
WBC mixed population: 0.6 10*3/uL (ref 0.1–1.8)
WBC: 6.6 10*3/uL (ref 4.0–10.5)

## 2015-03-16 MED ORDER — PROMETHAZINE HCL 25 MG/ML IJ SOLN
25.0000 mg | Freq: Once | INTRAMUSCULAR | Status: AC
  Administered 2015-03-16: 25 mg via INTRAVENOUS

## 2015-03-16 MED ORDER — ONDANSETRON 4 MG PO TBDP: 4.0000 mg | ORAL_TABLET | Freq: Three times a day (TID) | ORAL | Status: DC | PRN

## 2015-03-16 NOTE — Progress Notes (Signed)
Patient ID: Stephney Ruzich, female   DOB: February 24, 1951, 64 y.o.   MRN: [id number]      Subjective:    Patient ID: [name], female    DOB: Jan 14, 1951, 64 y.o.   MRN: [id number]  Patient presents for Illness  patient here with nausea and vomiting diarrhea 2 weeks. She was seen a week ago, she was given Phenergan as well as IV fluids. She is not taking any medications for 2 weeks she has a  very complicated medical history including coronary artery disease, diastolic heart failure type 2 diabetes neuropathy chronic she takes greater than 10 medications daily. Initially she was able to eat small amount and drink Sprite 0, today she tolerate any liquids without vomiting afterwards. She tried taking her pills well also. Her emesis is nonbloody and nonbilious her stools are just clear liquids this point.  Her previous labs showed CKD stage III , LFT were normal, CBC Mild anemia, normal WBC. She was unable to bring back stool studies   Review Of Systems:  GEN- denies fatigue, fever, weight loss,weakness, recent illness HEENT- denies eye drainage, change in vision, nasal discharge, CVS- denies chest pain, palpitations RESP- denies SOB, cough, wheeze ABD- +N/V, +change in stools, +abd pain GU- denies dysuria, hematuria, dribbling, incontinence MSK- denies joint pain, muscle aches, injury Neuro- denies headache, dizziness, syncope, seizure activity       Objective:    BP 132/72 mmHg  Pulse 78  Temp(Src) 97.9 F (36.6 C) (Oral)  Resp 18  Ht 5\' 5"  (1.651 m)  Wt 276 lb (125.193 kg)  BMI 45.93 kg/m2 GEN- NAD, alert and oriented x3 ,weight down 4lbs  HEENT- PERRL, EOMI, non injected sclera, pink conjunctiva, dry mucous membranes, oropharynx clear Neck- Supple, no LAD CVS- RRR, no murmur RESP-CTAB ABD-NABS,soft,TTP epigstric region, No rebound, no guarding EXT- + 1edema with chronic venous stasis changes- scabs on legs (dog scratches) Pulses- Radial, DP- 2+        Assessment & Plan:       Problem List Items Addressed This Visit    None    Visit Diagnoses    Non-intractable vomiting with nausea, unspecified vomiting type    -  Primary    Give 1 L NS in office, phenergan IM, she is unable to tolerate any medications at this point but declines CT scan, declines EMS to ER to be evaluated further. Concern for infectious colitis other differentials possible gastroparesis from the diabetes as well or partial SBO.  Now with 4lb weight loss and becoming more dehydrated. After IV fluids felt better, also given IM phenergan, again I voiced need for ER evaluation but she declined. I have given Zofran ODT. I will call and check in on her this evening, CBC fairly unremarkable, has some dilution anemia from baseline chronic anemia, normal WBC, CMET pending.     Relevant Orders    CBC w/MCH & 3 Part Diff    Comprehensive metabolic panel    Diarrhea of presumed infectious origin           Note: This dictation was prepared with Dragon dictation along with smaller phrase technology. Any transcriptional errors that result from this process are unintentional.

## 2015-03-16 NOTE — Patient Instructions (Addendum)
Take the zofran beneath the tongue every 6 hours  Take small sips of fluids Go to ER if you can not keep anything down  I will call and check on you tonight!

## 2015-03-19 ENCOUNTER — Other Ambulatory Visit: Payer: Medicare Other

## 2015-03-19 DIAGNOSIS — R197 Diarrhea, unspecified: Secondary | ICD-10-CM

## 2015-03-19 DIAGNOSIS — R112 Nausea with vomiting, unspecified: Secondary | ICD-10-CM

## 2015-03-21 ENCOUNTER — Ambulatory Visit: Payer: Self-pay | Admitting: Physician Assistant

## 2015-03-23 LAB — STOOL CULTURE

## 2015-03-26 ENCOUNTER — Other Ambulatory Visit: Payer: Self-pay | Admitting: Cardiology

## 2015-03-26 ENCOUNTER — Ambulatory Visit (INDEPENDENT_AMBULATORY_CARE_PROVIDER_SITE_OTHER): Payer: Commercial Managed Care - PPO | Admitting: Physician Assistant

## 2015-03-26 ENCOUNTER — Other Ambulatory Visit: Payer: Self-pay | Admitting: Physician Assistant

## 2015-03-26 ENCOUNTER — Encounter: Payer: Self-pay | Admitting: Physician Assistant

## 2015-03-26 VITALS — BP 134/82 | HR 72 | Temp 97.1°F | Resp 20 | Wt 281.0 lb

## 2015-03-26 DIAGNOSIS — I2510 Atherosclerotic heart disease of native coronary artery without angina pectoris: Secondary | ICD-10-CM | POA: Diagnosis not present

## 2015-03-26 DIAGNOSIS — K529 Noninfective gastroenteritis and colitis, unspecified: Secondary | ICD-10-CM | POA: Diagnosis not present

## 2015-03-26 DIAGNOSIS — Z23 Encounter for immunization: Secondary | ICD-10-CM | POA: Diagnosis not present

## 2015-03-26 LAB — CBC WITH DIFFERENTIAL/PLATELET
Basophils Absolute: 0.1 10*3/uL (ref 0.0–0.1)
Basophils Relative: 1 % (ref 0–1)
Eosinophils Absolute: 0.4 10*3/uL (ref 0.0–0.7)
Eosinophils Relative: 7 % — ABNORMAL HIGH (ref 0–5)
HCT: 35.6 % — ABNORMAL LOW (ref 36.0–46.0)
Hemoglobin: 11.1 g/dL — ABNORMAL LOW (ref 12.0–15.0)
Lymphocytes Relative: 16 % (ref 12–46)
Lymphs Abs: 0.9 10*3/uL (ref 0.7–4.0)
MCH: 24.3 pg — ABNORMAL LOW (ref 26.0–34.0)
MCHC: 31.2 g/dL (ref 30.0–36.0)
MCV: 77.9 fL — ABNORMAL LOW (ref 78.0–100.0)
MPV: 10.6 fL (ref 8.6–12.4)
Monocytes Absolute: 0.4 10*3/uL (ref 0.1–1.0)
Monocytes Relative: 7 % (ref 3–12)
Neutro Abs: 3.9 10*3/uL (ref 1.7–7.7)
Neutrophils Relative %: 69 % (ref 43–77)
Platelets: 267 10*3/uL (ref 150–400)
RBC: 4.57 MIL/uL (ref 3.87–5.11)
RDW: 16.6 % — ABNORMAL HIGH (ref 11.5–15.5)
WBC: 5.6 10*3/uL (ref 4.0–10.5)

## 2015-03-26 LAB — BASIC METABOLIC PANEL WITH GFR
BUN: 36 mg/dL — ABNORMAL HIGH (ref 7–25)
CO2: 27 mmol/L (ref 20–31)
Calcium: 8.8 mg/dL (ref 8.6–10.4)
Chloride: 100 mmol/L (ref 98–110)
Creat: 1.41 mg/dL — ABNORMAL HIGH (ref 0.50–0.99)
GFR, Est African American: 45 mL/min — ABNORMAL LOW (ref >=60)
GFR, Est Non African American: 39 mL/min — ABNORMAL LOW (ref >=60)
Glucose, Bld: 204 mg/dL — ABNORMAL HIGH (ref 70–99)
Potassium: 5.3 mmol/L (ref 3.5–5.3)
Sodium: 136 mmol/L (ref 135–146)

## 2015-03-26 NOTE — Telephone Encounter (Signed)
Medication refilled per protocol. 

## 2015-03-26 NOTE — Progress Notes (Signed)
Patient ID: Zaviah Nastri MRN: [id number], DOB: [birth year], 64 y.o. Date of Encounter: 03/26/2015, 11:38 AM    Chief Complaint:  Chief Complaint  Patient presents with  . follow up    says feeling some better     HPI: 64 y.o. year old white female here for follow-up of recent gastroenteritis. She had recent visit with me at which time she was having vomiting and diarrhea. We gave her 2 bags of IV fluids during that visit. After that she had follow-up with Dr. [name] and then Dr. [name] was unable to get in touch with her regarding results and was unable to do so. Our staff was able to get in touch with the patient on the phone but she was unable to come in for follow-up visit and evaluation until today. Therefore here for follow-up today. Dr. Soyla Murphy now manages her diabetes and even during her recent gastroenteritis she was in contact with Dr. Soyla Murphy and Dr. Soyla Murphy was adjusting her insulin through the recent illness. Patient has recent blood sugar readings documented on a log today and is to follow-up with Dr. Soyla Murphy regarding this. She states that her last episode of vomiting or diarrhea was last Thursday. Says that she started back her medications last Friday except she waited to add back her diuretic. Just restarted that yesterday because she started getting some swelling in her legs again so just restarted that yesterday. States that she has back to eating her usual diet.     Home Meds:   Outpatient Prescriptions Prior to Visit  Medication Sig Dispense Refill  . albuterol (PROVENTIL HFA;VENTOLIN HFA) 108 (90 BASE) MCG/ACT inhaler Inhale 2 puffs into the lungs every 6 (six) hours as needed for wheezing or shortness of breath.    . Ascorbic Acid (VITAMIN C PO) Take 1 tablet by mouth daily. 1000 mg    . aspirin EC 81 MG tablet Take 81 mg by mouth daily.    Marland Kitchen atorvastatin (LIPITOR) 80 MG tablet TAKE 1 TABLET DAILY AT 6PM (MUST KEEP APPT WITH DR.KATZ) 30 tablet 3  . carvedilol (COREG) 12.5  MG tablet TAKE 1 TABLET (12.5 MG TOTAL) BY MOUTH 2 (TWO) TIMES DAILY WITH A MEAL. 60 tablet 5  . Cholecalciferol (VITAMIN D) 2000 UNITS tablet Take 2,000 Units by mouth daily.    . clonazePAM (KLONOPIN) 1 MG tablet TAKE 1 TABLET BY MOUTH 3 TIMES A DAY AS NEEDED 90 tablet 2  . clopidogrel (PLAVIX) 75 MG tablet Take 1 tablet (75 mg total) by mouth daily with breakfast. 90 tablet 3  . fish oil-omega-3 fatty acids 1000 MG capsule Take 1 g by mouth daily.     . hydrocortisone (ANUSOL-HC) 25 MG suppository Place 1 suppository (25 mg total) rectally 2 (two) times daily. 12 suppository 0  . insulin NPH-regular Human (NOVOLIN 70/30) (70-30) 100 UNIT/ML injection Inject into the skin 3 (three) times daily with meals. 75 units twice a day and 55 with supper    . isosorbide dinitrate (ISORDIL) 10 MG tablet TAKE 1 TABLET (10 MG TOTAL) BY MOUTH 2 (TWO) TIMES DAILY. 60 tablet 3  . KLOR-CON M20 20 MEQ tablet TAKE 2 TABLETS BY MOUTH TWICE A DAY 120 tablet 6  . levothyroxine (SYNTHROID, LEVOTHROID) 50 MCG tablet Take 50 mcg by mouth daily before breakfast.    . losartan (COZAAR) 50 MG tablet TAKE 1 TABLET BY MOUTH DAILY 30 tablet 5  . metFORMIN (GLUCOPHAGE) 500 MG tablet Take 500 mg by mouth 2 (two)  times daily.     . metolazone (ZAROXOLYN) 2.5 MG tablet Take 1 tablet (2.5 mg total) by mouth daily as needed (For edema/weight gain.). 30 tablet 6  . Multiple Vitamin (MULTIVITAMIN WITH MINERALS) TABS tablet Take 1 tablet by mouth daily.    . nitroGLYCERIN (NITROSTAT) 0.4 MG SL tablet Place 1 tablet (0.4 mg total) under the tongue every 5 (five) minutes as needed for chest pain. 25 tablet 3  . ondansetron (ZOFRAN ODT) 4 MG disintegrating tablet Take 1 tablet (4 mg total) by mouth every 8 (eight) hours as needed for nausea or vomiting. 20 tablet 1  . ONE TOUCH ULTRA TEST test strip TEST 3 TIMES A DAY AND AS NEEDED  11  . oxyCODONE (OXY IR/ROXICODONE) 5 MG immediate release tablet Take 1-2 tablets (5-10 mg total) by mouth  every 3 (three) hours as needed for breakthrough pain. 60 tablet 0  . PARoxetine (PAXIL-CR) 25 MG 24 hr tablet TAKE 1 TABLET (25 MG TOTAL) BY MOUTH DAILY. TAKEN WITH 37.5 FOR A TOTAL = 62.5MG  30 tablet 5  . PARoxetine (PAXIL-CR) 37.5 MG 24 hr tablet TAKE 1 TABLET (37.5 MG TOTAL) BY MOUTH DAILY. TAKEN WITH 25MG  FOR A TOTAL = 62.5 MG 30 tablet 5  . pregabalin (LYRICA) 150 MG capsule Take 150 mg by mouth at bedtime.     . promethazine (PHENERGAN) 25 MG tablet Take 1 tablet (25 mg total) by mouth every 6 (six) hours as needed for nausea or vomiting. 30 tablet 0  . torsemide (DEMADEX) 20 MG tablet TAKE 4 TABLETS BY MOUTH IN THE MORNING AND 2 TABLETS IN THE EVENING....NO MORE REFILLS MAKE APPT. 180 tablet 6  . vitamin B-12 (CYANOCOBALAMIN) 1000 MCG tablet Take 1,000 mcg by mouth daily.    . vitamin E 1000 UNIT capsule Take 1,000 Units by mouth daily.     No facility-administered medications prior to visit.    Allergies:  Allergies  Allergen Reactions  . Codeine Nausea And Vomiting      Review of Systems: See HPI for pertinent ROS. All other ROS negative.    Physical Exam: Blood pressure 134/82, pulse 72, temperature 97.1 F (36.2 C), temperature source Oral, resp. rate 20, weight 281 lb (127.461 kg)., Body mass index is 46.76 kg/(m^2). General:  Obese WF. Appears in no acute distress. Neck: Supple. No thyromegaly. No lymphadenopathy. Lungs: Clear bilaterally to auscultation without wheezes, rales, or rhonchi. Breathing is unlabored. Heart: Regular rhythm. No murmurs, rubs, or gallops. Abdomen: Soft, non-tender, non-distended with normoactive bowel sounds. No hepatomegaly. No rebound/guarding. No obvious abdominal masses. Msk:  Strength and tone normal for age. Extremities/Skin: Warm and dry. Neuro: Alert and oriented X 3. Moves all extremities spontaneously. Gait is normal. CNII-XII grossly in tact. Psych:  Responds to questions appropriately with a normal affect.     ASSESSMENT AND  PLAN:  64 y.o. year old female with  1. Gastroenteritis--Resolved.  Recheck labs to make sure that these have improved.  - BASIC METABOLIC PANEL WITH GFR - CBC with Differential/Platelet   Signed, Olean Ree [place], [place], [facility] 03/26/2015 11:38 AM

## 2015-03-29 ENCOUNTER — Other Ambulatory Visit: Payer: Self-pay | Admitting: Physician Assistant

## 2015-03-29 NOTE — Telephone Encounter (Signed)
Prescription called to pharmacy on 01/25/2015, #2 refills.   Call placed to pharmacy.   Refill available.   Request denied.

## 2015-04-09 ENCOUNTER — Other Ambulatory Visit: Payer: Self-pay | Admitting: Physician Assistant

## 2015-04-10 NOTE — Telephone Encounter (Signed)
Medication refilled per protocol. 

## 2015-04-17 DIAGNOSIS — R809 Proteinuria, unspecified: Secondary | ICD-10-CM | POA: Diagnosis not present

## 2015-04-17 DIAGNOSIS — E1165 Type 2 diabetes mellitus with hyperglycemia: Secondary | ICD-10-CM | POA: Diagnosis not present

## 2015-04-17 DIAGNOSIS — I10 Essential (primary) hypertension: Secondary | ICD-10-CM | POA: Diagnosis not present

## 2015-04-17 DIAGNOSIS — G609 Hereditary and idiopathic neuropathy, unspecified: Secondary | ICD-10-CM | POA: Diagnosis not present

## 2015-04-26 NOTE — Progress Notes (Signed)
Chief Complaint  Patient presents with  . Follow-up    CAD, pt c/o swelling in legs, dizziness and SOB     History of Present Illness: 64 yo female with history of HLD, hypothyroidism, CAD with prior PCI, HTN, chronic diastolic CHF, carotid artery disease, DM, prior TIA who is here today for cardiac follow up. Her CAD has been followed by Dr. Ron Parker. Her heart failure is managed in the CHF clinic. First stent placed in RCA in 2000. Last cath in 2007 in setting of acute inferior STEMI. She was found to have acute occlusion of the RCA with moderate diffuse disease in the LAD and Circumflex. A bare metal stent was placed in the distal RCA. She also had a previous proximal RCA stent which was patent. Last echo 09/21/13 with normal LV systolic function, no significant valve disease. Stress myoview 03/01/14 with no ischemia.   She is here today for follow up. I am meeting her for the first time today. She has no chest pain. She describes dyspnea with minimal exertion, worsened lower extremity edema and weight gain. She is up 12 pounds over the last month. She has not called the CHF clinic to let them know. She has not been using metolazone. She has been taking torsemide as prescribed.   Primary Care Physician: [name], PA-C  Endocrine: [name]  Past Medical History  Diagnosis Date  . Tachycardia     Sinus tachycardia  . Dyslipidemia   . Hypothyroidism   . CAD (coronary artery disease)     MI in 2000 - MI  2007 - treated bare metal stent (no nuclear since then as 9/11)  . HTN (hypertension)   . Chronic diastolic heart failure (HCC)     a) ECHO (08/2013) EF 55-60% and RV function nl b) RHC (08/2013) RA 4, RV 30/5/7, PA 25/10 (16), PCWP 7, Fick CO/CI 6.3/2.7, PVR 1.5 WU, PA 61 and 66%  . RBBB (right bundle branch block)     Old  . Urinary incontinence   . Obesity   . Syncope     likely due to low blood sugar  . Carotid artery disease ([place])   . Acute MI ([place]) 1999; 2007  . CHF (congestive  heart failure) ([place])   . Anxiety   . Depression   . Type II diabetes mellitus ([place])   . Peripheral neuropathy ([place])   . Venous insufficiency   . Stroke [facility])     mini strokes  . PONV (postoperative nausea and vomiting)   . Renal insufficiency     DENIES  . Anemia     hx  . Anginal pain ([place])   . Exertional shortness of breath   . Daily headache     "~ every other day; since I fell in June" (03/15/2014)  . Osteoarthritis   . Arthritis     "generalized" (03/15/2014)    Past Surgical History  Procedure Laterality Date  . Knee arthroscopy Left 10/25/2006  . Coronary angioplasty with stent placement  1999; 2007    "1 + 1"  . Abdominal hysterectomy  1980's  . Tubal ligation  1970's  . Cataract extraction, bilateral Bilateral ?2013  . Shoulder arthroscopy w/ rotator cuff repair Right 03/14/2014  . Shoulder arthroscopy with open rotator cuff repair Right 03/14/2014    Procedure: RIGHT SHOULDER ARTHROSCOPY WITH BICEPS RELEASE, OPEN SUBSCAPULA REPAIR, OPEN SUPRASPINATUS REPAIR.;  Surgeon: Meredith Pel, MD;  Location: [facility];  Service: Orthopedics;  Laterality: Right;  .  Right heart catheterization N/A 09/22/2013    Procedure: RIGHT HEART CATH;  Surgeon: Jolaine Artist, MD;  Location: [facility] CATH LAB;  Service: Cardiovascular;  Laterality: N/A;    Current Outpatient Prescriptions  Medication Sig Dispense Refill  . albuterol (PROVENTIL HFA;VENTOLIN HFA) 108 (90 BASE) MCG/ACT inhaler Inhale 2 puffs into the lungs every 6 (six) hours as needed for wheezing or shortness of breath.    . Ascorbic Acid (VITAMIN C PO) Take 1 tablet by mouth daily. 1000 mg    . aspirin EC 81 MG tablet Take 81 mg by mouth daily.    Marland Kitchen atorvastatin (LIPITOR) 80 MG tablet TAKE 1 TABLET DAILY AT 6PM (MUST KEEP APPT WITH DR.KATZ) 30 tablet 3  . carvedilol (COREG) 12.5 MG tablet TAKE 1 TABLET (12.5 MG TOTAL) BY MOUTH 2 (TWO) TIMES DAILY WITH A MEAL. 60 tablet 5  . Cholecalciferol (VITAMIN D) 2000 UNITS tablet  Take 2,000 Units by mouth daily.    . clonazePAM (KLONOPIN) 1 MG tablet TAKE 1 TABLET BY MOUTH 3 TIMES A DAY AS NEEDED 90 tablet 2  . clopidogrel (PLAVIX) 75 MG tablet Take 1 tablet (75 mg total) by mouth daily with breakfast. 90 tablet 3  . fish oil-omega-3 fatty acids 1000 MG capsule Take 1 g by mouth daily.     . hydrocortisone (ANUSOL-HC) 25 MG suppository Place 1 suppository (25 mg total) rectally 2 (two) times daily. 12 suppository 0  . insulin NPH-regular Human (NOVOLIN 70/30) (70-30) 100 UNIT/ML injection Inject into the skin 3 (three) times daily with meals. 75 units in the morning, and 50 with lunch and 12 in the evening    . isosorbide dinitrate (ISORDIL) 10 MG tablet TAKE 1 TABLET (10 MG TOTAL) BY MOUTH 2 (TWO) TIMES DAILY. 60 tablet 3  . KLOR-CON M20 20 MEQ tablet TAKE 2 TABLETS BY MOUTH TWICE A DAY 120 tablet 6  . levothyroxine (SYNTHROID, LEVOTHROID) 50 MCG tablet Take 50 mcg by mouth daily before breakfast.    . losartan (COZAAR) 50 MG tablet TAKE 1 TABLET BY MOUTH DAILY 90 tablet 1  . metFORMIN (GLUCOPHAGE) 500 MG tablet Take 500 mg by mouth 2 (two) times daily.     . metFORMIN (GLUCOPHAGE-XR) 500 MG 24 hr tablet Take 500 mg by mouth 2 (two) times daily with a meal.  5  . metolazone (ZAROXOLYN) 2.5 MG tablet Take 1 tablet (2.5 mg total) by mouth daily as needed (For edema/weight gain.). 30 tablet 6  . Multiple Vitamin (MULTIVITAMIN WITH MINERALS) TABS tablet Take 1 tablet by mouth daily.    . nitroGLYCERIN (NITROSTAT) 0.4 MG SL tablet Place 1 tablet (0.4 mg total) under the tongue every 5 (five) minutes as needed for chest pain. 25 tablet 3  . ondansetron (ZOFRAN ODT) 4 MG disintegrating tablet Take 1 tablet (4 mg total) by mouth every 8 (eight) hours as needed for nausea or vomiting. 20 tablet 1  . ONE TOUCH ULTRA TEST test strip TEST 3 TIMES A DAY AND AS NEEDED  11  . PARoxetine (PAXIL-CR) 25 MG 24 hr tablet TAKE 1 TABLET (25 MG TOTAL) BY MOUTH DAILY. TAKEN WITH 37.5 FOR A TOTAL =  62.5MG  30 tablet 5  . PARoxetine (PAXIL-CR) 37.5 MG 24 hr tablet TAKE 1 TABLET (37.5 MG TOTAL) BY MOUTH DAILY. TAKEN WITH 25MG  FOR A TOTAL = 62.5 MG 30 tablet 5  . pregabalin (LYRICA) 150 MG capsule Take 150 mg by mouth at bedtime.     . promethazine (PHENERGAN)  25 MG tablet Take 1 tablet (25 mg total) by mouth every 6 (six) hours as needed for nausea or vomiting. 30 tablet 0  . torsemide (DEMADEX) 20 MG tablet Take 4 tablets (80 mg total) by mouth 2 (two) times daily. 240 tablet 3  . vitamin B-12 (CYANOCOBALAMIN) 1000 MCG tablet Take 1,000 mcg by mouth daily.    . vitamin E 1000 UNIT capsule Take 1,000 Units by mouth daily.     No current facility-administered medications for this visit.    Allergies  Allergen Reactions  . Codeine Nausea And Vomiting    Social History   Social History  . Marital Status: Married    Spouse Name: N/A  . Number of Children: 3  . Years of Education: 12th   Occupational History  .      unemploye   Social History Main Topics  . Smoking status: Former Smoker -- 3.00 packs/day for 32 years    Types: Cigarettes    Quit date: 10/24/1997  . Smokeless tobacco: Never Used  . Alcohol Use: Yes     Comment: "might have 2-3 daiquiris in the summer"  . Drug Use: No  . Sexual Activity: No   Other Topics Concern  . Not on file   Social History Narrative   Pt lives at home with her spouse.   Caffeine Use- 3 sodas daily    Family History  Problem Relation Age of Onset  . Coronary artery disease    . Heart attack Mother     Review of Systems:  As stated in the HPI and otherwise negative.   BP 110/62 mmHg  Pulse 80  Ht 5\' 6"  (1.676 m)  Wt 293 lb 9.6 oz (133.176 kg)  BMI 47.41 kg/m2  SpO2 96%  Physical Examination: General: Well developed, well nourished, NAD HEENT: OP clear, mucus membranes moist SKIN: warm, dry. No rashes. Neuro: No focal deficits Musculoskeletal: Muscle strength 5/5 all ext Psychiatric: Mood and affect normal Neck: +  JVD, no carotid bruits, no thyromegaly, no lymphadenopathy. Lungs:Clear bilaterally, no wheezes, rhonci, crackles Cardiovascular: Regular rate and rhythm. No murmurs, gallops or rubs. Abdomen:Soft. Bowel sounds present. Non-tender.  Extremities: 3+ bilateral lower extremity edema. Chronic venous stasis changes.   Echo 09/21/13: Left ventricle: The cavity size was normal. Systolic function was normal. The estimated ejection fraction was in the range of 55% to 60%. Wall motion was normal; there were no regional wall motion abnormalities. - Left atrium: The atrium was moderately dilated. - Pericardium, extracardiac: A trivial pericardial effusion was identified.  Cardiac cath 2007:  Stress myoview 03/01/14: QPS Raw Data Images: There is interference from nuclear activity from structures below the diaphragm. This does not affect the ability to read the study. Stress Images: There is decreased uptake in the inferior apical wall. Rest Images: Normal homogeneous uptake in all areas of the myocardium. Subtraction (SDS): These findings are consistent with ischemia. Transient Ischemic Dilatation (Normal <1.22): 0.82 Lung/Heart Ratio (Normal <0.45): 0.43 Quantitative Gated Spect Images QGS EDV: 97 ml QGS ESV: 49 ml Impression Exercise Capacity: Lexiscan with no exercise. BP Response: Normal blood pressure response. Clinical Symptoms: There is dyspnea. ECG Impression: No significant ST segment change suggestive of ischemia. Comparison with Prior Nuclear Study: No images to compare Overall Impression: Low risk stress nuclear study with a small, severe intensity, reversible distal inferior and apical defect consistent with mild ischemia. LV Ejection Fraction: 50%. LV Wall Motion: NL LV Function; NL Wall Motion  Stress myoview 03/01/14: Raw  Data Images: There is interference from nuclear activity from structures below the diaphragm. This does not affect the ability to read  the study. Stress Images: There is decreased uptake in the inferior apical wall. Rest Images: Normal homogeneous uptake in all areas of the myocardium. Subtraction (SDS): These findings are consistent with ischemia. Transient Ischemic Dilatation (Normal <1.22): 0.82 Lung/Heart Ratio (Normal <0.45): 0.43  Quantitative Gated Spect Images QGS EDV: 97 ml QGS ESV: 49 ml  Impression Exercise Capacity: Lexiscan with no exercise. BP Response: Normal blood pressure response. Clinical Symptoms: There is dyspnea. ECG Impression: No significant ST segment change suggestive of ischemia. Comparison with Prior Nuclear Study: No images to compare  Overall Impression: Low risk stress nuclear study with a small, severe intensity, reversible distal inferior and apical defect consistent with mild ischemia.  LV Ejection Fraction: 50%. LV Wall Motion: NL LV Function; NL Wall Motion  EKG:  EKG is not ordered today. The ekg ordered today demonstrates   Recent Labs: 03/16/2015: ALT 15 03/26/2015: BUN 36*; Creat 1.41*; Hemoglobin 11.1*; Platelets 267; Potassium 5.3; Sodium 136   Lipid Panel No results found for: CHOL, TRIG, HDL, CHOLHDL, VLDL, LDLCALC, LDLDIRECT   Wt Readings from Last 3 Encounters:  04/27/15 293 lb 9.6 oz (133.176 kg)  03/26/15 281 lb (127.461 kg)  03/16/15 276 lb (125.193 kg)     Other studies Reviewed: Additional studies/ records that were reviewed today include: . Review of the above records demonstrates:    Assessment and Plan:   1. CAD: Stable. Stress myoview October 2015 with no ischemia. Continue ASA, Plavix, beta blocker, statin, ARB, Imdur.    2. HTN: BP controlled. No changes today.   3. Chronic diastolic CHF: She has NYHA III symptoms. She is volume overloaded. She is up 12 lbs in last 5 weeks. I have offered admission to [facility] for diuresis but she refuses. Will increased torsemide to 80mg  po BID. Will use metolazone today. Check BMET today. I am  concerned that we will not be able to diurese her as an outpatient with CKD. She has f/u in the CHF clinic in 4 days. Continue coreg 12.5 mg BID and losartan 50 mg daily. Reinforced the need and importance of daily weights, a low sodium diet, and fluid restriction (less than 2 L a day).   Current medicines are reviewed at length with the patient today.  The patient does not have concerns regarding medicines.  The following changes have been made:  no change  Labs/ tests ordered today include:  No orders of the defined types were placed in this encounter.    Disposition:   FU with me in 6 months  Signed, Lauree Chandler, MD 04/27/2015 5:04 PM    [facility] [address], [address], [place]  [address] Phone: [phone]; Fax: [phone]

## 2015-04-27 ENCOUNTER — Ambulatory Visit (INDEPENDENT_AMBULATORY_CARE_PROVIDER_SITE_OTHER): Payer: Commercial Managed Care - PPO | Admitting: Cardiovascular Disease

## 2015-04-27 ENCOUNTER — Encounter: Payer: Self-pay | Admitting: Cardiovascular Disease

## 2015-04-27 VITALS — BP 110/62 | HR 80 | Ht 66.0 in | Wt 293.6 lb

## 2015-04-27 DIAGNOSIS — I10 Essential (primary) hypertension: Secondary | ICD-10-CM

## 2015-04-27 DIAGNOSIS — I5033 Acute on chronic diastolic (congestive) heart failure: Secondary | ICD-10-CM

## 2015-04-27 DIAGNOSIS — I2510 Atherosclerotic heart disease of native coronary artery without angina pectoris: Secondary | ICD-10-CM | POA: Diagnosis not present

## 2015-04-27 MED ORDER — TORSEMIDE 20 MG PO TABS: 80.0000 mg | ORAL_TABLET | Freq: Two times a day (BID) | ORAL | Status: DC

## 2015-04-27 NOTE — Patient Instructions (Signed)
Medication Instructions:  Your physician has recommended you make the following change in your medication: Increase torsemide to 80 mg by mouth twice daily.   Labwork: none  Testing/Procedures: None   Follow-Up: Your physician wants you to follow-up in: 6 months.  You will receive a reminder letter in the mail two months in advance. If you don't receive a letter, please call our office to schedule the follow-up appointment.  You have an appointment in the heart failure clinic on December 6,2016 at 2:00  Any Other Special Instructions Will Be Listed Below (If Applicable).     If you need a refill on your cardiac medications before your next appointment, please call your pharmacy.

## 2015-04-30 NOTE — Progress Notes (Signed)
Patient ID: Susan [name], female   DOB: 18-Feb-1951, 64 y.o.   MRN: [id number]  PCP: Dr Edilia Bo  Cardiologist: Dr Myrtis Ser  Endocrinologist: Dr Lurene Shadow  Primary HF Cardiologist: Dr Leory Plowman   HPI: Susan [name] is a 65 year old with a history of RBBB, DM, Hypothyroid, diastolic heart failure, CAD MI 2000/2007 BMS 2007, TIA, obesity.   She returns for heart failure follow up. Evaluated by Dr Clifton James on Friday and she was instructed to take torsemide 80 mg twice a day + metolazone. Offered hospital admit but she refused. She was confused and took 160 mg of torsemide in am and 80 mg in pm on Friday + metolazone. Over the weekend she continued 160 mg torsemide in am and 80 mg in pm. She remains SOB with exertion. Difficulty with ambulation. Not weighing at home. Increased leg edema. Drinking lots of fluids. Not following a low salt diet. Says blood sugars are all over the place at home.    RHC 09/22/13: RA = 4  RV = 30/5/7  PA = 25/10 (16)  PCW = 7  Fick cardiac output/index = 6.3/2.7  PVR = 1.5 WU   FA sat = 93%  PA sat = 61%, 66%   Labs: 07/18/13 K 4.8 Creatinine 1.38 Glucose 1005 she refused to go to [facility] Labs : 08/17/13 K 3.9 Creatinine 1.4 Glucose 499 Labs: 09/29/13 K 4.5 Creatinine 1.4  Labs 04/18/14 K 4.4 Creatinine 1.18 Labs 03/26/2015: K 5.3 Creatinine 1.41   SH: Former smoker quit 15 year ago. Does drink alcohol. She is disabled. Lives at home with her husband and disabled son - Husband is truck Hospital doctor. Son has a brain injury after he attempted suicide 2 years ago.    FH: Mom MI  ROS: All systems negative except as listed in HPI, PMH and Problem List.  Past Medical History  Diagnosis Date  . Tachycardia     Sinus tachycardia  . Dyslipidemia   . Hypothyroidism   . CAD (coronary artery disease)     MI in 2000 - MI  2007 - treated bare metal stent (no nuclear since then as 9/11)  . HTN (hypertension)   . Chronic diastolic heart failure (HCC)     a) ECHO (08/2013) EF  55-60% and RV function nl b) RHC (08/2013) RA 4, RV 30/5/7, PA 25/10 (16), PCWP 7, Fick CO/CI 6.3/2.7, PVR 1.5 WU, PA 61 and 66%  . RBBB (right bundle branch block)     Old  . Urinary incontinence   . Obesity   . Syncope     likely due to low blood sugar  . Carotid artery disease (HCC)   . Acute MI (HCC) 1999; 2007  . CHF (congestive heart failure) (HCC)   . Anxiety   . Depression   . Type II diabetes mellitus (HCC)   . Peripheral neuropathy (HCC)   . Venous insufficiency   . Stroke [facility])     mini strokes  . PONV (postoperative nausea and vomiting)   . Renal insufficiency     DENIES  . Anemia     hx  . Anginal pain (HCC)   . Exertional shortness of breath   . Daily headache     "~ every other day; since I fell in June" (03/15/2014)  . Osteoarthritis   . Arthritis     "generalized" (03/15/2014)    Current Outpatient Prescriptions  Medication Sig Dispense Refill  . albuterol (PROVENTIL HFA;VENTOLIN HFA) 108 (90 BASE) MCG/ACT inhaler Inhale  2 puffs into the lungs every 6 (six) hours as needed for wheezing or shortness of breath.    Marland Kitchen aspirin EC 81 MG tablet Take 81 mg by mouth daily.    Marland Kitchen atorvastatin (LIPITOR) 80 MG tablet TAKE 1 TABLET DAILY AT 6PM (MUST KEEP APPT WITH DR.KATZ) 30 tablet 3  . carvedilol (COREG) 12.5 MG tablet TAKE 1 TABLET (12.5 MG TOTAL) BY MOUTH 2 (TWO) TIMES DAILY WITH A MEAL. 60 tablet 5  . Cholecalciferol (VITAMIN D) 2000 UNITS tablet Take 2,000 Units by mouth daily.    . clonazePAM (KLONOPIN) 1 MG tablet Take 1 mg by mouth 3 (three) times daily as needed for anxiety.    . clopidogrel (PLAVIX) 75 MG tablet Take 1 tablet (75 mg total) by mouth daily with breakfast. 90 tablet 3  . fish oil-omega-3 fatty acids 1000 MG capsule Take 1 g by mouth daily.     . hydrocortisone (ANUSOL-HC) 25 MG suppository Place 1 suppository (25 mg total) rectally 2 (two) times daily. 12 suppository 0  . insulin NPH-regular Human (NOVOLIN 70/30) (70-30) 100 UNIT/ML injection  Inject into the skin 3 (three) times daily with meals. 75 units in the morning, and 50 with lunch and 12 in the evening    . isosorbide dinitrate (ISORDIL) 10 MG tablet TAKE 1 TABLET (10 MG TOTAL) BY MOUTH 2 (TWO) TIMES DAILY. 60 tablet 3  . KLOR-CON M20 20 MEQ tablet TAKE 2 TABLETS BY MOUTH TWICE A DAY 120 tablet 6  . losartan (COZAAR) 50 MG tablet TAKE 1 TABLET BY MOUTH DAILY 90 tablet 1  . metFORMIN (GLUCOPHAGE-XR) 500 MG 24 hr tablet Take 500 mg by mouth 2 (two) times daily with a meal.  5  . metolazone (ZAROXOLYN) 2.5 MG tablet Take 1 tablet (2.5 mg total) by mouth daily as needed (For edema/weight gain.). 30 tablet 6  . Multiple Vitamin (MULTIVITAMIN WITH MINERALS) TABS tablet Take 1 tablet by mouth daily.    . ondansetron (ZOFRAN ODT) 4 MG disintegrating tablet Take 1 tablet (4 mg total) by mouth every 8 (eight) hours as needed for nausea or vomiting. 20 tablet 1  . ONE TOUCH ULTRA TEST test strip TEST 3 TIMES A DAY AND AS NEEDED  11  . PARoxetine (PAXIL-CR) 25 MG 24 hr tablet TAKE 1 TABLET (25 MG TOTAL) BY MOUTH DAILY. TAKEN WITH 37.5 FOR A TOTAL = 62.5MG  30 tablet 5  . PARoxetine (PAXIL-CR) 37.5 MG 24 hr tablet TAKE 1 TABLET (37.5 MG TOTAL) BY MOUTH DAILY. TAKEN WITH 25MG  FOR A TOTAL = 62.5 MG 30 tablet 5  . pregabalin (LYRICA) 150 MG capsule Take 150 mg by mouth at bedtime.     . promethazine (PHENERGAN) 25 MG tablet Take 1 tablet (25 mg total) by mouth every 6 (six) hours as needed for nausea or vomiting. 30 tablet 0  . torsemide (DEMADEX) 20 MG tablet Take 80-160 mg by mouth 2 (two) times daily. Take 160 mg (8 tablets) in the morning and 80 mg (4 tablets) in the afternoon    . levothyroxine (SYNTHROID, LEVOTHROID) 50 MCG tablet Take 50 mcg by mouth daily before breakfast.    . nitroGLYCERIN (NITROSTAT) 0.4 MG SL tablet Place 1 tablet (0.4 mg total) under the tongue every 5 (five) minutes as needed for chest pain. (Patient not taking: Reported on 05/01/2015) 25 tablet 3   No current  facility-administered medications for this encounter.    Filed Vitals:   05/01/15 1409  BP: 128/52  Pulse:  89  Weight: 289 lb 6.4 oz (131.271 kg)  SpO2: 96%   PHYSICAL EXAM: General:  Chronically ill appearing. Dyspneic walking in the clinic  HEENT: normal Neck: supple. JVP difficult to assess d/t body habitus but appears elevated.   Carotids 2+ bilaterally; no bruits. No lymphadenopathy or thryomegaly appreciated. Cor: PMI normal. Regular rate & rhythm. No rubs, gallops or murmurs. Lungs: clear Abdomen: obese, soft, nontender, nondistended. No hepatosplenomegaly. No bruits or masses. Good bowel sounds. Extremities: no cyanosis, clubbing, rash, R and LLE -3+ edema with small partial thickness skin loss noted. erythema noted LLE with blisters noted on posterior aspect. Neuro: alert & orientedx3, cranial nerves grossly intact. Moves all 4 extremities w/o difficulty. Affect pleasant.    ASSESSMENT & PLAN:   1. Chronic Systolic Heart Failure: NICM, EF 55-60% (08/2013)  -- NYHA III symptoms. Volume status elevated despite increasing diuretic regimen over the weekend. Liberal fluid intake and diet most likely playing a larger role in volume overload.  Refer to [facility]. Give 80 mg IV lasix daily for 2 days then she will resume torsemide 80 mg twice a day.  - Continue coreg 12.5 mg BID and losartan 50 mg daily.  - Reinforced the need and importance of daily weights, a low sodium diet, and fluid restriction (less than 2 L a day). Instructed to call the HF clinic if weight increases more than 3 lbs overnight or 5 lbs in a week 2.. Dyspnea- Ongoing dyspnea with exertion. She has been offered sleep study many times but refuses.   3. Uncontrolled DM: Blood sugars at home all over the place. Followed by Dr Lurene Shadow.  4. CAD- no evidence of ischemia- on aspirin and statin.  5. Obesity: Discussed portion control the importance of weight loss.  6. HTN- Stable continue current regimen.   Follow up in 7  days. Check BMET and BNP now. Refer to [facility] for HHRN HHPT. HH will also need to wrap lower extremities. Check BMET on Friday.  May need paramedicine again.   Shabreka Coulon NP-C  2:35 PM

## 2015-05-01 ENCOUNTER — Ambulatory Visit (HOSPITAL_COMMUNITY)
Admission: RE | Admit: 2015-05-01 | Discharge: 2015-05-01 | Disposition: A | Discharge status: Home / Self Care | Payer: Commercial Managed Care - PPO | Source: Ambulatory Visit | Attending: Cardiology | Admitting: Cardiology

## 2015-05-01 VITALS — BP 128/52 | HR 89 | Wt 289.4 lb

## 2015-05-01 DIAGNOSIS — I110 Hypertensive heart disease with heart failure: Secondary | ICD-10-CM | POA: Insufficient documentation

## 2015-05-01 DIAGNOSIS — I10 Essential (primary) hypertension: Secondary | ICD-10-CM

## 2015-05-01 DIAGNOSIS — I2510 Atherosclerotic heart disease of native coronary artery without angina pectoris: Secondary | ICD-10-CM | POA: Insufficient documentation

## 2015-05-01 DIAGNOSIS — R0609 Other forms of dyspnea: Secondary | ICD-10-CM | POA: Diagnosis not present

## 2015-05-01 DIAGNOSIS — R0602 Shortness of breath: Secondary | ICD-10-CM

## 2015-05-01 DIAGNOSIS — E119 Type 2 diabetes mellitus without complications: Secondary | ICD-10-CM | POA: Insufficient documentation

## 2015-05-01 DIAGNOSIS — E669 Obesity, unspecified: Secondary | ICD-10-CM | POA: Insufficient documentation

## 2015-05-01 DIAGNOSIS — I5042 Chronic combined systolic (congestive) and diastolic (congestive) heart failure: Secondary | ICD-10-CM

## 2015-05-01 DIAGNOSIS — I5022 Chronic systolic (congestive) heart failure: Secondary | ICD-10-CM | POA: Insufficient documentation

## 2015-05-01 LAB — BASIC METABOLIC PANEL
Anion gap: 10 (ref 5–15)
BUN: 63 mg/dL — ABNORMAL HIGH (ref 6–20)
CO2: 25 mmol/L (ref 22–32)
Calcium: 9 mg/dL (ref 8.9–10.3)
Chloride: 100 mmol/L — ABNORMAL LOW (ref 101–111)
Creatinine, Ser: 1.8 mg/dL — ABNORMAL HIGH (ref 0.44–1.00)
GFR calc Af Amer: 33 mL/min — ABNORMAL LOW (ref >60)
GFR calc non Af Amer: 28 mL/min — ABNORMAL LOW (ref >60)
Glucose, Bld: 264 mg/dL — ABNORMAL HIGH (ref 65–99)
Potassium: 4.9 mmol/L (ref 3.5–5.1)
Sodium: 135 mmol/L (ref 135–145)

## 2015-05-01 LAB — BRAIN NATRIURETIC PEPTIDE: B Natriuretic Peptide: 110.6 pg/mL — ABNORMAL HIGH (ref 0.0–100.0)

## 2015-05-01 MED ORDER — FUROSEMIDE 10 MG/ML PO SOLN: 80.0000 mg | Freq: Once | ORAL | Status: DC

## 2015-05-01 MED ORDER — POTASSIUM CHLORIDE CRYS ER 20 MEQ PO TBCR: 40.0000 meq | EXTENDED_RELEASE_TABLET | Freq: Two times a day (BID) | ORAL | Status: DC

## 2015-05-01 MED ORDER — TORSEMIDE 20 MG PO TABS: 80.0000 mg | ORAL_TABLET | Freq: Two times a day (BID) | ORAL | Status: DC

## 2015-05-01 NOTE — Patient Instructions (Addendum)
CONTINUE Toresmide 80 mg (4 tabs) twice a day CONTINUE Potassium 20 meq( 2 tabs) twice a day  You have been referred to [place] for further evaluation and medication management, a [facility] will be to your home to administer IV Lasix 05/02/15 or 05/03/15  Your physician recommends that you schedule a follow-up appointment in: 1 week in the NP Clinic  Do the following things EVERYDAY: 1) Weigh yourself in the morning before breakfast. Write it down and keep it in a log. 2) Take your medicines as prescribed 3) Eat low salt foods-Limit salt (sodium) to 2000 mg per day.  4) Stay as active as you can everyday 5) Limit all fluids for the day to less than 2 liters 6)

## 2015-05-01 NOTE — Progress Notes (Signed)
Advanced Heart Failure Medication Review by a Pharmacist  Does the patient  feel that his/her medications are working for him/her?  yes  Has the patient been experiencing any side effects to the medications prescribed?  no  Does the patient measure his/her own blood pressure or blood glucose at home?  no   Does the patient have any problems obtaining medications due to transportation or finances?   no  Understanding of regimen: good Understanding of indications: good Potential of compliance: good Patient understands to avoid NSAIDs. Patient understands to avoid decongestants.  Issues to address at subsequent visits: None   Pharmacist comments: Susan [name] is a pleasant 64 yo F presenting with her current medication list. She reports good compliance with her medications but has been taking her torsemide at 160 mg qam and 80 mg qpm stating that this is what Dr. Julianne Handler told her to take. She did not have any other medication-related questions or concerns for me at this time.   Susan [name], PharmD, BCPS, CPP Clinical Pharmacist Pager: [phone] Phone: [phone] 05/01/2015 2:47 PM      Time with patient: 10 minutes Preparation and documentation time: 2 minutes Total time: 12 minutes

## 2015-05-03 ENCOUNTER — Telehealth (HOSPITAL_COMMUNITY): Payer: Self-pay

## 2015-05-03 NOTE — Telephone Encounter (Signed)
Miranda [facility] RN with [facility] called to get VO for lab orders.  Per Darrick Grinder NP-C, recent lab results showed a need for repeat this [place]iry[place] 12/9.  Advised to draw BMET tomorrow and fax to [phone]  Renee Pain

## 2015-05-04 ENCOUNTER — Telehealth (HOSPITAL_COMMUNITY): Payer: Self-pay | Admitting: Cardiology

## 2015-05-04 ENCOUNTER — Other Ambulatory Visit (HOSPITAL_COMMUNITY)
Admission: RE | Admit: 2015-05-04 | Discharge: 2015-05-04 | Disposition: A | Discharge status: Home / Self Care | Payer: Commercial Managed Care - PPO | Source: Other Acute Inpatient Hospital | Attending: Internal Medicine | Admitting: Internal Medicine

## 2015-05-04 DIAGNOSIS — I5033 Acute on chronic diastolic (congestive) heart failure: Secondary | ICD-10-CM | POA: Insufficient documentation

## 2015-05-04 LAB — BASIC METABOLIC PANEL
Anion gap: 11 (ref 5–15)
BUN: 68 mg/dL — ABNORMAL HIGH (ref 6–20)
CO2: 27 mmol/L (ref 22–32)
Calcium: 9.1 mg/dL (ref 8.9–10.3)
Chloride: 97 mmol/L — ABNORMAL LOW (ref 101–111)
Creatinine, Ser: 1.74 mg/dL — ABNORMAL HIGH (ref 0.44–1.00)
GFR calc Af Amer: 34 mL/min — ABNORMAL LOW (ref >60)
GFR calc non Af Amer: 30 mL/min — ABNORMAL LOW (ref >60)
Glucose, Bld: 323 mg/dL — ABNORMAL HIGH (ref 65–99)
Potassium: 5 mmol/L (ref 3.5–5.1)
Sodium: 135 mmol/L (ref 135–145)

## 2015-05-04 NOTE — Telephone Encounter (Signed)
Detailed message left for patient regarding abnormal labs done by [facility] on 05/02/15 [id number] Per VO [name], NP Pt should follow up with PCP

## 2015-05-08 ENCOUNTER — Other Ambulatory Visit (HOSPITAL_COMMUNITY): Payer: Self-pay | Admitting: Internal Medicine

## 2015-05-08 ENCOUNTER — Ambulatory Visit (HOSPITAL_COMMUNITY)
Admission: RE | Admit: 2015-05-08 | Discharge: 2015-05-08 | Disposition: A | Discharge status: Home / Self Care | Payer: Commercial Managed Care - PPO | Source: Ambulatory Visit | Attending: Internal Medicine | Admitting: Internal Medicine

## 2015-05-08 VITALS — BP 114/72 | HR 96 | Wt 291.8 lb

## 2015-05-08 DIAGNOSIS — E669 Obesity, unspecified: Secondary | ICD-10-CM | POA: Diagnosis not present

## 2015-05-08 DIAGNOSIS — R0600 Dyspnea, unspecified: Secondary | ICD-10-CM | POA: Diagnosis not present

## 2015-05-08 DIAGNOSIS — I10 Essential (primary) hypertension: Secondary | ICD-10-CM | POA: Diagnosis not present

## 2015-05-08 DIAGNOSIS — R0602 Shortness of breath: Secondary | ICD-10-CM | POA: Diagnosis not present

## 2015-05-08 DIAGNOSIS — I5022 Chronic systolic (congestive) heart failure: Secondary | ICD-10-CM | POA: Insufficient documentation

## 2015-05-08 DIAGNOSIS — I2510 Atherosclerotic heart disease of native coronary artery without angina pectoris: Secondary | ICD-10-CM | POA: Insufficient documentation

## 2015-05-08 DIAGNOSIS — I5042 Chronic combined systolic (congestive) and diastolic (congestive) heart failure: Secondary | ICD-10-CM | POA: Diagnosis not present

## 2015-05-08 DIAGNOSIS — I110 Hypertensive heart disease with heart failure: Secondary | ICD-10-CM | POA: Diagnosis not present

## 2015-05-08 DIAGNOSIS — E119 Type 2 diabetes mellitus without complications: Secondary | ICD-10-CM | POA: Diagnosis not present

## 2015-05-08 MED ORDER — METOLAZONE 2.5 MG PO TABS: ORAL_TABLET | ORAL | Status: DC

## 2015-05-08 MED ORDER — NITROGLYCERIN 0.4 MG SL SUBL: 0.4000 mg | SUBLINGUAL_TABLET | SUBLINGUAL | Status: DC | PRN

## 2015-05-08 NOTE — Patient Instructions (Signed)
Be sure to take Torsemide 80 mg (4 tabs) in the AM and 40 mg (2 tabs) in the PM  CHANGE Metolazone to 2.5 mg every Wednesday and Sunday  Your physician recommends that you schedule a follow-up appointment in: 4 weeks  Do the following things EVERYDAY: 1) Weigh yourself in the morning before breakfast. Write it down and keep it in a log. 2) Take your medicines as prescribed 3) Eat low salt foods-Limit salt (sodium) to 2000 mg per day.  4) Stay as active as you can everyday 5) Limit all fluids for the day to less than 2 liters 6)

## 2015-05-08 NOTE — Progress Notes (Signed)
Advanced Heart Failure Medication Review by a Pharmacist  Does the patient  feel that his/her medications are working for him/her?  yes  Has the patient been experiencing any side effects to the medications prescribed?  no  Does the patient measure his/her own blood pressure or blood glucose at home?  yes   Does the patient have any problems obtaining medications due to transportation or finances?   no  Understanding of regimen: good Understanding of indications: good Potential of compliance: good Patient understands to avoid NSAIDs. Patient understands to avoid decongestants.  Issues to address at subsequent visits: None   Pharmacist comments:  Ms. Veeder is a pleasant 64 yo F presenting with her last AVS medication list. She reports excellent compliance with her medications. She did not have any specific medication-related questions or concerns for me at this time.   Ruta Hinds. Velva Harman, PharmD, BCPS, CPP Clinical Pharmacist Pager: [phone] Phone: [phone] 05/08/2015 2:56 PM      Time with patient: 8 minutes Preparation and documentation time: 2 minutes Total time: 10 minutes

## 2015-05-08 NOTE — Progress Notes (Signed)
Patient ID: Susan [name], female   DOB: Jul 17, 1950, 64 y.o.   MRN: [id number]  PCP: Dr Edilia Bo  Cardiologist: Dr Myrtis Ser  Endocrinologist: Dr Lurene Shadow  Primary HF Cardiologist: Dr Leory Plowman   HPI: Susan [name] is a 64 year old with a history of RBBB, DM, Hypothyroid, diastolic heart failure, CAD MI 2000/2007 BMS 2007, TIA, obesity.   She returns for heart failure follow up. Last visit HH added and she was given IV lasix 2 days in a row. Weight was 293 and went down to 280 pounds but todays weight is up to 290 pounds. Says she is eating sausage for breakfast. Ongoing leg edema. Ongoing dyspnea exertion. +Orthopnea. Denies PND. Taking all medications. [facility] by [facility].   RHC 09/22/13: RA = 4  RV = 30/5/7  PA = 25/10 (16)  PCW = 7  Fick cardiac output/index = 6.3/2.7  PVR = 1.5 WU   FA sat = 93%  PA sat = 61%, 66%   Labs: 07/18/13 K 4.8 Creatinine 1.38 Glucose 1005 she refused to go to [facility] Labs : 08/17/13 K 3.9 Creatinine 1.4 Glucose 499 Labs: 09/29/13 K 4.5 Creatinine 1.4  Labs 04/18/14 K 4.4 Creatinine 1.18 Labs 03/26/2015: K 5.3 Creatinine 1.41  Labs 05/01/2015: K 4.9 Creatinine 1.8 Labs 05/04/2015: K 5.0 Creatinine 1.74   SH: Former smoker quit 15 year ago. Does drink alcohol. She is disabled. Lives at home with her husband and disabled son - Husband is truck [employer] doctor. Son has a brain injury after he attempted suicide 2 years ago.    FH: Mom MI  ROS: All systems negative except as listed in HPI, PMH and Problem List.  Past Medical History  Diagnosis Date  . Tachycardia     Sinus tachycardia  . Dyslipidemia   . Hypothyroidism   . CAD (coronary artery disease)     MI in 2000 - MI  2007 - treated bare metal stent (no nuclear since then as 9/11)  . HTN (hypertension)   . Chronic diastolic heart failure (HCC)     a) ECHO (08/2013) EF 55-60% and RV function nl b) RHC (08/2013) RA 4, RV 30/5/7, PA 25/10 (16), PCWP 7, Fick CO/CI 6.3/2.7, PVR 1.5 WU, PA 61 and 66%  . RBBB (right  bundle branch block)     Old  . Urinary incontinence   . Obesity   . Syncope     likely due to low blood sugar  . Carotid artery disease (HCC)   . Acute MI (HCC) 1999; 2007  . CHF (congestive heart failure) (HCC)   . Anxiety   . Depression   . Type II diabetes mellitus (HCC)   . Peripheral neuropathy (HCC)   . Venous insufficiency   . Stroke [facility])     mini strokes  . PONV (postoperative nausea and vomiting)   . Renal insufficiency     DENIES  . Anemia     hx  . Anginal pain (HCC)   . Exertional shortness of breath   . Daily headache     "~ every other day; since I fell in June" (03/15/2014)  . Osteoarthritis   . Arthritis     "generalized" (03/15/2014)    Current Outpatient Prescriptions  Medication Sig Dispense Refill  . albuterol (PROVENTIL HFA;VENTOLIN HFA) 108 (90 BASE) MCG/ACT inhaler Inhale 2 puffs into the lungs every 6 (six) hours as needed for wheezing or shortness of breath.    Marland Kitchen aspirin EC 81 MG tablet Take 81 mg by  mouth daily.    Marland Kitchen atorvastatin (LIPITOR) 80 MG tablet TAKE 1 TABLET DAILY AT 6PM (MUST KEEP APPT WITH DR.KATZ) 30 tablet 3  . carvedilol (COREG) 12.5 MG tablet TAKE 1 TABLET (12.5 MG TOTAL) BY MOUTH 2 (TWO) TIMES DAILY WITH A MEAL. 60 tablet 5  . Cholecalciferol (VITAMIN D) 2000 UNITS tablet Take 2,000 Units by mouth daily.    . clonazePAM (KLONOPIN) 1 MG tablet Take 1 mg by mouth 3 (three) times daily as needed for anxiety.    . clopidogrel (PLAVIX) 75 MG tablet Take 1 tablet (75 mg total) by mouth daily with breakfast. 90 tablet 3  . fish oil-omega-3 fatty acids 1000 MG capsule Take 1 g by mouth daily.     . furosemide (LASIX) 10 MG/ML solution Take 8 mLs (80 mg total) by mouth once. 8 mL 1  . hydrocortisone (ANUSOL-HC) 25 MG suppository Place 1 suppository (25 mg total) rectally 2 (two) times daily. 12 suppository 0  . insulin NPH-regular Human (NOVOLIN 70/30) (70-30) 100 UNIT/ML injection Inject into the skin 3 (three) times daily with meals. 75  units in the morning, and 50 with lunch and 12 in the evening    . isosorbide dinitrate (ISORDIL) 10 MG tablet TAKE 1 TABLET (10 MG TOTAL) BY MOUTH 2 (TWO) TIMES DAILY. 60 tablet 3  . levothyroxine (SYNTHROID, LEVOTHROID) 50 MCG tablet Take 50 mcg by mouth daily before breakfast.    . losartan (COZAAR) 50 MG tablet TAKE 1 TABLET BY MOUTH DAILY 90 tablet 1  . metFORMIN (GLUCOPHAGE-XR) 500 MG 24 hr tablet Take 500 mg by mouth 2 (two) times daily with a meal.  5  . metolazone (ZAROXOLYN) 2.5 MG tablet Take 1 tablet (2.5 mg total) by mouth daily as needed (For edema/weight gain.). 30 tablet 6  . Multiple Vitamin (MULTIVITAMIN WITH MINERALS) TABS tablet Take 1 tablet by mouth daily.    . nitroGLYCERIN (NITROSTAT) 0.4 MG SL tablet Place 1 tablet (0.4 mg total) under the tongue every 5 (five) minutes as needed for chest pain. (Patient not taking: Reported on 05/01/2015) 25 tablet 3  . ondansetron (ZOFRAN ODT) 4 MG disintegrating tablet Take 1 tablet (4 mg total) by mouth every 8 (eight) hours as needed for nausea or vomiting. 20 tablet 1  . ONE TOUCH ULTRA TEST test strip TEST 3 TIMES A DAY AND AS NEEDED  11  . PARoxetine (PAXIL-CR) 25 MG 24 hr tablet TAKE 1 TABLET (25 MG TOTAL) BY MOUTH DAILY. TAKEN WITH 37.5 FOR A TOTAL = 62.5MG  30 tablet 5  . PARoxetine (PAXIL-CR) 37.5 MG 24 hr tablet TAKE 1 TABLET (37.5 MG TOTAL) BY MOUTH DAILY. TAKEN WITH 25MG  FOR A TOTAL = 62.5 MG 30 tablet 5  . potassium chloride SA (KLOR-CON M20) 20 MEQ tablet Take 2 tablets (40 mEq total) by mouth 2 (two) times daily. 120 tablet 6  . pregabalin (LYRICA) 150 MG capsule Take 150 mg by mouth at bedtime.     . promethazine (PHENERGAN) 25 MG tablet Take 1 tablet (25 mg total) by mouth every 6 (six) hours as needed for nausea or vomiting. 30 tablet 0  . torsemide (DEMADEX) 20 MG tablet Take 4 tablets (80 mg total) by mouth 2 (two) times daily. 240 tablet 3   No current facility-administered medications for this encounter.    Filed  Vitals:   05/08/15 1445  BP: 114/72  Pulse: 96  Weight: 291 lb 12.8 oz (132.36 kg)  SpO2: 95%   PHYSICAL EXAM:  General:  Chronically ill appearing. Dyspneic walking in the clinic  HEENT: normal Neck: supple. JVP difficult to assess d/t body habitus. Mildly elevated.    Carotids 2+ bilaterally; no bruits. No lymphadenopathy or thryomegaly appreciated. Cor: PMI normal. Regular rate & rhythm. No rubs, gallops or murmurs. Lungs: clear Abdomen: obese, soft, nontender, nondistended. No hepatosplenomegaly. No bruits or masses. Good bowel sounds. Extremities: no cyanosis, clubbing, rash, R and LLE 1-2+ edema with compression wraps. . Neuro: alert & orientedx3, cranial nerves grossly intact. Moves all 4 extremities w/o difficulty. Affect pleasant.    ASSESSMENT & PLAN:   1. Chronic Systolic Heart Failure: NICM, EF 55-60% (08/2013)  -- NYHA III symptoms. Volume status seems a little better. Continue torsemide 80 mg in am and 40 mg in pm. Add metolazone 2.5 mg twice a week.  - Continue coreg 12.5 mg BID and losartan 50 mg daily.  - Reinforced the need and importance of daily weights, a low sodium diet, and fluid restriction (less than 2 L a day). Instructed to call the HF clinic if weight increases more than 3 lbs overnight or 5 lbs in a week 2.. Dyspnea- Ongoing dyspnea with exertion. She has been offered sleep study many times but refuses.   3. Uncontrolled DM: Blood sugars at home all over the place. Followed by Dr Lurene Shadow. Asked her to follow up.  4. CAD- no evidence of ischemia- on aspirin and statin.  5. Obesity: Discussed portion control the importance of weight loss.  6. HTN- Stable continue current regimen.   Follow up in 4 weeks. Continue HHRN.   May need paramedicine again.   Susan Steidle NP-C  2:41 PM

## 2015-05-10 ENCOUNTER — Other Ambulatory Visit: Payer: Self-pay | Admitting: Physician Assistant

## 2015-05-10 NOTE — Telephone Encounter (Signed)
Medication refilled per protocol. 

## 2015-05-24 ENCOUNTER — Other Ambulatory Visit: Payer: Self-pay | Admitting: Obstetrics and Gynecology

## 2015-05-24 DIAGNOSIS — R928 Other abnormal and inconclusive findings on diagnostic imaging of breast: Secondary | ICD-10-CM

## 2015-05-31 DIAGNOSIS — R809 Proteinuria, unspecified: Secondary | ICD-10-CM | POA: Diagnosis not present

## 2015-05-31 DIAGNOSIS — E1165 Type 2 diabetes mellitus with hyperglycemia: Secondary | ICD-10-CM | POA: Diagnosis not present

## 2015-05-31 DIAGNOSIS — G609 Hereditary and idiopathic neuropathy, unspecified: Secondary | ICD-10-CM | POA: Diagnosis not present

## 2015-05-31 DIAGNOSIS — I10 Essential (primary) hypertension: Secondary | ICD-10-CM | POA: Diagnosis not present

## 2015-06-07 ENCOUNTER — Encounter: Payer: Self-pay | Admitting: Physician Assistant

## 2015-06-07 ENCOUNTER — Inpatient Hospital Stay (HOSPITAL_COMMUNITY)
Admission: EM | Admit: 2015-06-07 | Discharge: 2015-06-10 | DRG: 392 | Disposition: A | Discharge status: Home / Self Care | Payer: PPO | Attending: Internal Medicine | Admitting: Internal Medicine

## 2015-06-07 ENCOUNTER — Encounter (HOSPITAL_COMMUNITY): Payer: Self-pay | Admitting: *Deleted

## 2015-06-07 ENCOUNTER — Emergency Department (HOSPITAL_COMMUNITY): Payer: PPO

## 2015-06-07 ENCOUNTER — Ambulatory Visit (INDEPENDENT_AMBULATORY_CARE_PROVIDER_SITE_OTHER): Payer: PPO | Admitting: Physician Assistant

## 2015-06-07 VITALS — BP 118/60 | HR 88 | Temp 97.8°F | Resp 20

## 2015-06-07 DIAGNOSIS — I25119 Atherosclerotic heart disease of native coronary artery with unspecified angina pectoris: Secondary | ICD-10-CM | POA: Diagnosis present

## 2015-06-07 DIAGNOSIS — Z7902 Long term (current) use of antithrombotics/antiplatelets: Secondary | ICD-10-CM | POA: Diagnosis not present

## 2015-06-07 DIAGNOSIS — I252 Old myocardial infarction: Secondary | ICD-10-CM | POA: Diagnosis not present

## 2015-06-07 DIAGNOSIS — L97929 Non-pressure chronic ulcer of unspecified part of left lower leg with unspecified severity: Secondary | ICD-10-CM | POA: Diagnosis present

## 2015-06-07 DIAGNOSIS — Z794 Long term (current) use of insulin: Secondary | ICD-10-CM | POA: Diagnosis not present

## 2015-06-07 DIAGNOSIS — R05 Cough: Secondary | ICD-10-CM | POA: Diagnosis not present

## 2015-06-07 DIAGNOSIS — I5022 Chronic systolic (congestive) heart failure: Secondary | ICD-10-CM

## 2015-06-07 DIAGNOSIS — Z8673 Personal history of transient ischemic attack (TIA), and cerebral infarction without residual deficits: Secondary | ICD-10-CM | POA: Diagnosis not present

## 2015-06-07 DIAGNOSIS — I5032 Chronic diastolic (congestive) heart failure: Secondary | ICD-10-CM

## 2015-06-07 DIAGNOSIS — I4510 Unspecified right bundle-branch block: Secondary | ICD-10-CM | POA: Diagnosis not present

## 2015-06-07 DIAGNOSIS — I10 Essential (primary) hypertension: Secondary | ICD-10-CM | POA: Diagnosis present

## 2015-06-07 DIAGNOSIS — M1990 Unspecified osteoarthritis, unspecified site: Secondary | ICD-10-CM | POA: Diagnosis present

## 2015-06-07 DIAGNOSIS — I5023 Acute on chronic systolic (congestive) heart failure: Secondary | ICD-10-CM

## 2015-06-07 DIAGNOSIS — E118 Type 2 diabetes mellitus with unspecified complications: Secondary | ICD-10-CM

## 2015-06-07 DIAGNOSIS — I130 Hypertensive heart and chronic kidney disease with heart failure and stage 1 through stage 4 chronic kidney disease, or unspecified chronic kidney disease: Secondary | ICD-10-CM | POA: Diagnosis present

## 2015-06-07 DIAGNOSIS — R739 Hyperglycemia, unspecified: Secondary | ICD-10-CM

## 2015-06-07 DIAGNOSIS — R0600 Dyspnea, unspecified: Secondary | ICD-10-CM | POA: Diagnosis not present

## 2015-06-07 DIAGNOSIS — I2510 Atherosclerotic heart disease of native coronary artery without angina pectoris: Secondary | ICD-10-CM | POA: Diagnosis present

## 2015-06-07 DIAGNOSIS — R112 Nausea with vomiting, unspecified: Secondary | ICD-10-CM | POA: Diagnosis not present

## 2015-06-07 DIAGNOSIS — E1142 Type 2 diabetes mellitus with diabetic polyneuropathy: Secondary | ICD-10-CM | POA: Diagnosis present

## 2015-06-07 DIAGNOSIS — I5042 Chronic combined systolic (congestive) and diastolic (congestive) heart failure: Secondary | ICD-10-CM | POA: Diagnosis present

## 2015-06-07 DIAGNOSIS — E860 Dehydration: Secondary | ICD-10-CM | POA: Diagnosis present

## 2015-06-07 DIAGNOSIS — N289 Disorder of kidney and ureter, unspecified: Secondary | ICD-10-CM | POA: Diagnosis not present

## 2015-06-07 DIAGNOSIS — E1122 Type 2 diabetes mellitus with diabetic chronic kidney disease: Secondary | ICD-10-CM | POA: Diagnosis present

## 2015-06-07 DIAGNOSIS — Z90710 Acquired absence of both cervix and uterus: Secondary | ICD-10-CM | POA: Diagnosis not present

## 2015-06-07 DIAGNOSIS — E039 Hypothyroidism, unspecified: Secondary | ICD-10-CM | POA: Diagnosis present

## 2015-06-07 DIAGNOSIS — Z955 Presence of coronary angioplasty implant and graft: Secondary | ICD-10-CM | POA: Diagnosis not present

## 2015-06-07 DIAGNOSIS — R7309 Other abnormal glucose: Secondary | ICD-10-CM | POA: Diagnosis not present

## 2015-06-07 DIAGNOSIS — Z87891 Personal history of nicotine dependence: Secondary | ICD-10-CM | POA: Diagnosis not present

## 2015-06-07 DIAGNOSIS — R0602 Shortness of breath: Secondary | ICD-10-CM | POA: Diagnosis not present

## 2015-06-07 DIAGNOSIS — K2970 Gastritis, unspecified, without bleeding: Secondary | ICD-10-CM | POA: Diagnosis present

## 2015-06-07 DIAGNOSIS — I878 Other specified disorders of veins: Secondary | ICD-10-CM | POA: Diagnosis not present

## 2015-06-07 DIAGNOSIS — N189 Chronic kidney disease, unspecified: Secondary | ICD-10-CM

## 2015-06-07 DIAGNOSIS — R197 Diarrhea, unspecified: Secondary | ICD-10-CM | POA: Diagnosis not present

## 2015-06-07 DIAGNOSIS — E1165 Type 2 diabetes mellitus with hyperglycemia: Secondary | ICD-10-CM | POA: Diagnosis not present

## 2015-06-07 DIAGNOSIS — L97919 Non-pressure chronic ulcer of unspecified part of right lower leg with unspecified severity: Secondary | ICD-10-CM | POA: Diagnosis not present

## 2015-06-07 DIAGNOSIS — Z6841 Body Mass Index (BMI) 40.0 and over, adult: Secondary | ICD-10-CM | POA: Diagnosis not present

## 2015-06-07 DIAGNOSIS — E785 Hyperlipidemia, unspecified: Secondary | ICD-10-CM | POA: Diagnosis not present

## 2015-06-07 DIAGNOSIS — Z7982 Long term (current) use of aspirin: Secondary | ICD-10-CM | POA: Diagnosis not present

## 2015-06-07 DIAGNOSIS — Z9181 History of falling: Secondary | ICD-10-CM | POA: Diagnosis not present

## 2015-06-07 DIAGNOSIS — E1100 Type 2 diabetes mellitus with hyperosmolarity without nonketotic hyperglycemic-hyperosmolar coma (NKHHC): Secondary | ICD-10-CM | POA: Diagnosis present

## 2015-06-07 DIAGNOSIS — K529 Noninfective gastroenteritis and colitis, unspecified: Principal | ICD-10-CM | POA: Diagnosis present

## 2015-06-07 DIAGNOSIS — N184 Chronic kidney disease, stage 4 (severe): Secondary | ICD-10-CM | POA: Diagnosis not present

## 2015-06-07 DIAGNOSIS — E669 Obesity, unspecified: Secondary | ICD-10-CM | POA: Diagnosis not present

## 2015-06-07 DIAGNOSIS — N179 Acute kidney failure, unspecified: Secondary | ICD-10-CM | POA: Diagnosis present

## 2015-06-07 LAB — CBC WITH DIFFERENTIAL/PLATELET
Basophils Absolute: 0 10*3/uL (ref 0.0–0.1)
Basophils Relative: 0 %
Eosinophils Absolute: 0.1 10*3/uL (ref 0.0–0.7)
Eosinophils Relative: 2 %
HCT: 33.3 % — ABNORMAL LOW (ref 36.0–46.0)
Hemoglobin: 9.9 g/dL — ABNORMAL LOW (ref 12.0–15.0)
Lymphocytes Relative: 14 %
Lymphs Abs: 0.7 10*3/uL (ref 0.7–4.0)
MCH: 22.8 pg — ABNORMAL LOW (ref 26.0–34.0)
MCHC: 29.7 g/dL — ABNORMAL LOW (ref 30.0–36.0)
MCV: 76.6 fL — ABNORMAL LOW (ref 78.0–100.0)
Monocytes Absolute: 0.3 10*3/uL (ref 0.1–1.0)
Monocytes Relative: 5 %
Neutro Abs: 3.8 10*3/uL (ref 1.7–7.7)
Neutrophils Relative %: 79 %
Platelets: 248 10*3/uL (ref 150–400)
RBC: 4.35 MIL/uL (ref 3.87–5.11)
RDW: 16.5 % — ABNORMAL HIGH (ref 11.5–15.5)
WBC: 4.8 10*3/uL (ref 4.0–10.5)

## 2015-06-07 LAB — COMPREHENSIVE METABOLIC PANEL
ALT: 15 U/L (ref 14–54)
AST: 17 U/L (ref 15–41)
Albumin: 3 g/dL — ABNORMAL LOW (ref 3.5–5.0)
Alkaline Phosphatase: 108 U/L (ref 38–126)
Anion gap: 13 (ref 5–15)
BUN: 39 mg/dL — ABNORMAL HIGH (ref 6–20)
CO2: 22 mmol/L (ref 22–32)
Calcium: 8.9 mg/dL (ref 8.9–10.3)
Chloride: 96 mmol/L — ABNORMAL LOW (ref 101–111)
Creatinine, Ser: 1.91 mg/dL — ABNORMAL HIGH (ref 0.44–1.00)
GFR calc Af Amer: 30 mL/min — ABNORMAL LOW (ref >60)
GFR calc non Af Amer: 26 mL/min — ABNORMAL LOW (ref >60)
Glucose, Bld: 657 mg/dL (ref 65–99)
Potassium: 4.4 mmol/L (ref 3.5–5.1)
Sodium: 131 mmol/L — ABNORMAL LOW (ref 135–145)
Total Bilirubin: 0.7 mg/dL (ref 0.3–1.2)
Total Protein: 6.5 g/dL (ref 6.5–8.1)

## 2015-06-07 LAB — CBG MONITORING, ED
Glucose-Capillary: 553 mg/dL (ref 65–99)
Glucose-Capillary: 330 mg/dL — ABNORMAL HIGH (ref 65–99)
Glucose-Capillary: 312 mg/dL — ABNORMAL HIGH (ref 65–99)

## 2015-06-07 LAB — URINALYSIS, ROUTINE W REFLEX MICROSCOPIC
Bilirubin Urine: NEGATIVE
Glucose, UA: >1000 mg/dL — AB
Ketones, ur: NEGATIVE mg/dL
Leukocytes, UA: NEGATIVE
Nitrite: NEGATIVE
Protein, ur: 100 mg/dL — AB
Specific Gravity, Urine: 1.026 (ref 1.005–1.030)
pH: 5.5 (ref 5.0–8.0)

## 2015-06-07 LAB — CBC
HCT: 30.5 % — ABNORMAL LOW (ref 36.0–46.0)
Hemoglobin: 9.4 g/dL — ABNORMAL LOW (ref 12.0–15.0)
MCH: 23 pg — ABNORMAL LOW (ref 26.0–34.0)
MCHC: 30.8 g/dL (ref 30.0–36.0)
MCV: 74.6 fL — ABNORMAL LOW (ref 78.0–100.0)
Platelets: 228 10*3/uL (ref 150–400)
RBC: 4.09 MIL/uL (ref 3.87–5.11)
RDW: 16.3 % — ABNORMAL HIGH (ref 11.5–15.5)
WBC: 4.4 10*3/uL (ref 4.0–10.5)

## 2015-06-07 LAB — BRAIN NATRIURETIC PEPTIDE: B Natriuretic Peptide: 93.4 pg/mL (ref 0.0–100.0)

## 2015-06-07 LAB — CREATININE, SERUM
Creatinine, Ser: 1.57 mg/dL — ABNORMAL HIGH (ref 0.44–1.00)
GFR calc Af Amer: 39 mL/min — ABNORMAL LOW (ref >60)
GFR calc non Af Amer: 33 mL/min — ABNORMAL LOW (ref >60)

## 2015-06-07 LAB — I-STAT TROPONIN, ED: Troponin i, poc: 0.02 ng/mL (ref 0.00–0.08)

## 2015-06-07 LAB — GLUCOSE, FINGERSTICK (STAT): Glucose, fingerstick: >444 mg/dL — ABNORMAL HIGH (ref 70–99)

## 2015-06-07 LAB — URINE MICROSCOPIC-ADD ON

## 2015-06-07 LAB — GLUCOSE, CAPILLARY: Glucose-Capillary: 128 mg/dL — ABNORMAL HIGH (ref 65–99)

## 2015-06-07 MED ORDER — SODIUM CHLORIDE 0.9 % IV SOLN
INTRAVENOUS | Status: DC
  Filled 2015-06-07: qty 2.5

## 2015-06-07 MED ORDER — POTASSIUM CHLORIDE CRYS ER 20 MEQ PO TBCR
40.0000 meq | EXTENDED_RELEASE_TABLET | Freq: Two times a day (BID) | ORAL | Status: DC
  Administered 2015-06-07 – 2015-06-09 (×4): 40 meq via ORAL
  Filled 2015-06-07 (×5): qty 2

## 2015-06-07 MED ORDER — SODIUM CHLORIDE 0.9 % IV SOLN
INTRAVENOUS | Status: DC
  Administered 2015-06-07: 21:00:00 via INTRAVENOUS

## 2015-06-07 MED ORDER — PREGABALIN 75 MG PO CAPS
150.0000 mg | ORAL_CAPSULE | Freq: Three times a day (TID) | ORAL | Status: DC
  Administered 2015-06-07 – 2015-06-10 (×8): 150 mg via ORAL
  Filled 2015-06-07 (×8): qty 2

## 2015-06-07 MED ORDER — VITAMIN D 1000 UNITS PO TABS
2000.0000 [IU] | ORAL_TABLET | Freq: Every day | ORAL | Status: DC
  Administered 2015-06-08 – 2015-06-10 (×3): 2000 [IU] via ORAL
  Filled 2015-06-07 (×4): qty 2

## 2015-06-07 MED ORDER — SODIUM CHLORIDE 0.9 % IV SOLN
INTRAVENOUS | Status: DC
  Administered 2015-06-07: 17:00:00 via INTRAVENOUS

## 2015-06-07 MED ORDER — ATORVASTATIN CALCIUM 80 MG PO TABS
80.0000 mg | ORAL_TABLET | Freq: Every day | ORAL | Status: DC
  Administered 2015-06-08 – 2015-06-09 (×2): 80 mg via ORAL
  Filled 2015-06-07 (×2): qty 1

## 2015-06-07 MED ORDER — CLOPIDOGREL BISULFATE 75 MG PO TABS
75.0000 mg | ORAL_TABLET | Freq: Every day | ORAL | Status: DC
  Administered 2015-06-08 – 2015-06-10 (×3): 75 mg via ORAL
  Filled 2015-06-07 (×3): qty 1

## 2015-06-07 MED ORDER — PAROXETINE HCL ER 37.5 MG PO TB24
37.5000 mg | ORAL_TABLET | Freq: Every day | ORAL | Status: DC
  Administered 2015-06-08 – 2015-06-10 (×3): 37.5 mg via ORAL
  Filled 2015-06-07 (×4): qty 1

## 2015-06-07 MED ORDER — SODIUM CHLORIDE 0.9 % IV BOLUS (SEPSIS)
500.0000 mL | Freq: Once | INTRAVENOUS | Status: AC
Start: 2015-06-07 — End: 2015-06-07
  Administered 2015-06-07: 500 mL via INTRAVENOUS

## 2015-06-07 MED ORDER — CARVEDILOL 12.5 MG PO TABS
12.5000 mg | ORAL_TABLET | Freq: Two times a day (BID) | ORAL | Status: DC
  Administered 2015-06-08 – 2015-06-10 (×5): 12.5 mg via ORAL
  Filled 2015-06-07 (×5): qty 1

## 2015-06-07 MED ORDER — PROMETHAZINE HCL 25 MG/ML IJ SOLN
12.5000 mg | Freq: Four times a day (QID) | INTRAMUSCULAR | Status: DC | PRN
  Administered 2015-06-07: 12.5 mg via INTRAVENOUS
  Filled 2015-06-07: qty 1

## 2015-06-07 MED ORDER — INSULIN ASPART 100 UNIT/ML ~~LOC~~ SOLN
0.0000 [IU] | Freq: Three times a day (TID) | SUBCUTANEOUS | Status: DC
  Administered 2015-06-08: 2 [IU] via SUBCUTANEOUS

## 2015-06-07 MED ORDER — PROMETHAZINE HCL 25 MG PO TABS
25.0000 mg | ORAL_TABLET | Freq: Four times a day (QID) | ORAL | Status: DC | PRN
  Filled 2015-06-07: qty 1

## 2015-06-07 MED ORDER — INSULIN ASPART 100 UNIT/ML ~~LOC~~ SOLN: 0.0000 [IU] | Freq: Every day | SUBCUTANEOUS | Status: DC

## 2015-06-07 MED ORDER — DEXTROSE 50 % IV SOLN: 25.0000 mL | INTRAVENOUS | Status: DC | PRN

## 2015-06-07 MED ORDER — ASPIRIN EC 81 MG PO TBEC
81.0000 mg | DELAYED_RELEASE_TABLET | Freq: Every day | ORAL | Status: DC
  Administered 2015-06-08 – 2015-06-10 (×3): 81 mg via ORAL
  Filled 2015-06-07 (×3): qty 1

## 2015-06-07 MED ORDER — ALBUTEROL SULFATE (2.5 MG/3ML) 0.083% IN NEBU: 2.5000 mg | INHALATION_SOLUTION | RESPIRATORY_TRACT | Status: DC | PRN

## 2015-06-07 MED ORDER — ISOSORBIDE DINITRATE 10 MG PO TABS
10.0000 mg | ORAL_TABLET | Freq: Two times a day (BID) | ORAL | Status: DC
  Administered 2015-06-07 – 2015-06-10 (×6): 10 mg via ORAL
  Filled 2015-06-07 (×6): qty 1

## 2015-06-07 MED ORDER — FE FUMARATE-B12-VIT C-FA-IFC PO CAPS
1.0000 | ORAL_CAPSULE | Freq: Every day | ORAL | Status: DC
  Administered 2015-06-08 – 2015-06-10 (×3): 1 via ORAL
  Filled 2015-06-07 (×5): qty 1

## 2015-06-07 MED ORDER — INSULIN ASPART 100 UNIT/ML ~~LOC~~ SOLN
0.0000 [IU] | SUBCUTANEOUS | Status: DC
  Administered 2015-06-07: 11 [IU] via SUBCUTANEOUS
  Filled 2015-06-07: qty 1

## 2015-06-07 MED ORDER — OMEGA-3-ACID ETHYL ESTERS 1 G PO CAPS
1.0000 g | ORAL_CAPSULE | Freq: Every day | ORAL | Status: DC
  Administered 2015-06-07 – 2015-06-10 (×4): 1 g via ORAL
  Filled 2015-06-07 (×4): qty 1

## 2015-06-07 MED ORDER — INSULIN ASPART 100 UNIT/ML ~~LOC~~ SOLN
10.0000 [IU] | Freq: Once | SUBCUTANEOUS | Status: AC
  Administered 2015-06-07: 10 [IU] via INTRAVENOUS
  Filled 2015-06-07: qty 1

## 2015-06-07 MED ORDER — FERRALET 90 90-1 MG PO TABS: 1.0000 | ORAL_TABLET | Freq: Every day | ORAL | Status: DC

## 2015-06-07 MED ORDER — LEVOTHYROXINE SODIUM 50 MCG PO TABS
50.0000 ug | ORAL_TABLET | Freq: Every day | ORAL | Status: DC
  Administered 2015-06-08 – 2015-06-10 (×3): 50 ug via ORAL
  Filled 2015-06-07 (×3): qty 1

## 2015-06-07 MED ORDER — ONDANSETRON HCL 4 MG/2ML IJ SOLN
4.0000 mg | Freq: Once | INTRAMUSCULAR | Status: AC
  Administered 2015-06-07: 4 mg via INTRAVENOUS
  Filled 2015-06-07: qty 2

## 2015-06-07 MED ORDER — INSULIN REGULAR BOLUS VIA INFUSION
0.0000 [IU] | Freq: Three times a day (TID) | INTRAVENOUS | Status: DC
  Filled 2015-06-07: qty 10

## 2015-06-07 MED ORDER — INSULIN NPH (HUMAN) (ISOPHANE) 100 UNIT/ML ~~LOC~~ SUSP
35.0000 [IU] | Freq: Every evening | SUBCUTANEOUS | Status: DC
  Filled 2015-06-07: qty 10

## 2015-06-07 MED ORDER — INSULIN NPH (HUMAN) (ISOPHANE) 100 UNIT/ML ~~LOC~~ SUSP
35.0000 [IU] | Freq: Two times a day (BID) | SUBCUTANEOUS | Status: DC
  Filled 2015-06-07: qty 10

## 2015-06-07 MED ORDER — ADULT MULTIVITAMIN W/MINERALS CH
1.0000 | ORAL_TABLET | Freq: Every day | ORAL | Status: DC
  Administered 2015-06-08 – 2015-06-10 (×3): 1 via ORAL
  Filled 2015-06-07 (×3): qty 1

## 2015-06-07 MED ORDER — CLONAZEPAM 0.5 MG PO TABS: 0.5000 mg | ORAL_TABLET | Freq: Three times a day (TID) | ORAL | Status: DC | PRN

## 2015-06-07 MED ORDER — HYDROCORTISONE ACETATE 25 MG RE SUPP: 25.0000 mg | Freq: Two times a day (BID) | RECTAL | Status: DC | PRN

## 2015-06-07 MED ORDER — HEPARIN SODIUM (PORCINE) 5000 UNIT/ML IJ SOLN
5000.0000 [IU] | Freq: Three times a day (TID) | INTRAMUSCULAR | Status: DC
  Administered 2015-06-07 – 2015-06-10 (×9): 5000 [IU] via SUBCUTANEOUS
  Filled 2015-06-07 (×5): qty 1

## 2015-06-07 MED ORDER — DEXTROSE-NACL 5-0.45 % IV SOLN: INTRAVENOUS | Status: DC

## 2015-06-07 MED ORDER — OMEGA-3 FATTY ACIDS 1000 MG PO CAPS: 1.0000 g | ORAL_CAPSULE | Freq: Every day | ORAL | Status: DC

## 2015-06-07 MED ORDER — INSULIN NPH (HUMAN) (ISOPHANE) 100 UNIT/ML ~~LOC~~ SUSP
35.0000 [IU] | Freq: Every day | SUBCUTANEOUS | Status: DC
  Administered 2015-06-08: 35 [IU] via SUBCUTANEOUS
  Filled 2015-06-07: qty 10

## 2015-06-07 NOTE — ED Provider Notes (Signed)
CSN: [id number]     Arrival date & time 06/07/15  1343 History   First MD Initiated Contact with Patient 06/07/15 1344     Chief Complaint  Patient presents with  . Hyperglycemia     (Consider location/radiation/quality/duration/timing/severity/associated sxs/prior Treatment) HPI Patient presents with vomiting and diarrhea for the last week but worse in the last few days. States she's had to 3 episodes of each a day. She is not tolerating anything by mouth. Unable to take medications. She's had some shortness of breath, worse with exertion. She also notes bilateral lower extremity swelling. She's also had some oozing ulcerations from her lower extremity wounds. This for chronic in nature. Denies any abdominal pain or chest pain. Blood sugar has been reading high for last week. Was seen at her primary 64 office and transferred to the emergency department for care. Past Medical History  Diagnosis Date  . Tachycardia     Sinus tachycardia  . Dyslipidemia   . Hypothyroidism   . CAD (coronary artery disease)     MI in 2000 - MI  2007 - treated bare metal stent (no nuclear since then as 9/11)  . HTN (hypertension)   . Chronic diastolic heart failure (HCC)     a) ECHO (08/2013) EF 55-60% and RV function nl b) RHC (08/2013) RA 4, RV 30/5/7, PA 25/10 (16), PCWP 7, Fick CO/CI 6.3/2.7, PVR 1.5 WU, PA 61 and 66%  . RBBB (right bundle branch block)     Old  . Urinary incontinence   . Obesity   . Syncope     likely due to low blood sugar  . Carotid artery disease ([place])   . Acute MI ([place]) 1999; 2007  . CHF (congestive heart failure) ([place])   . Anxiety   . Depression   . Type II diabetes mellitus ([place])   . Peripheral neuropathy ([place])   . Venous insufficiency   . Stroke [facility])     mini strokes  . PONV (postoperative nausea and vomiting)   . Renal insufficiency     DENIES  . Anemia     hx  . Anginal pain ([place])   . Exertional shortness of breath   . Daily headache     "~ every other  day; since I fell in June" (03/15/2014)  . Osteoarthritis   . Arthritis     "generalized" (03/15/2014)   Past Surgical History  Procedure Laterality Date  . Knee arthroscopy Left 10/25/2006  . Coronary angioplasty with stent placement  1999; 2007    "1 + 1"  . Abdominal hysterectomy  1980's  . Tubal ligation  1970's  . Cataract extraction, bilateral Bilateral ?2013  . Shoulder arthroscopy w/ rotator cuff repair Right 03/14/2014  . Shoulder arthroscopy with open rotator cuff repair Right 03/14/2014    Procedure: RIGHT SHOULDER ARTHROSCOPY WITH BICEPS RELEASE, OPEN SUBSCAPULA REPAIR, OPEN SUPRASPINATUS REPAIR.;  Surgeon: Meredith Pel, MD;  Location: [facility];  Service: Orthopedics;  Laterality: Right;  . Right heart catheterization N/A 09/22/2013    Procedure: RIGHT HEART CATH;  Surgeon: Jolaine Artist, MD;  Location: [facility] CATH LAB;  Service: Cardiovascular;  Laterality: N/A;   Family History  Problem Relation Age of Onset  . Coronary artery disease    . Heart attack Mother    Social History  Substance Use Topics  . Smoking status: Former Smoker -- 3.00 packs/day for 32 years    Types: Cigarettes    Quit date: 10/24/1997  . Smokeless tobacco:  Never Used  . Alcohol Use: Yes     Comment: "might have 2-3 daiquiris in the summer"   OB History    No data available     Review of Systems  Constitutional: Positive for fatigue. Negative for fever and chills.  Respiratory: Positive for shortness of breath. Negative for cough.   Cardiovascular: Positive for leg swelling. Negative for chest pain and palpitations.  Gastrointestinal: Positive for nausea, vomiting and diarrhea. Negative for abdominal pain and blood in stool.  Genitourinary: Negative for dysuria, frequency and flank pain.  Musculoskeletal: Negative for myalgias, back pain, neck pain and neck stiffness.  Skin: Positive for wound.  Neurological: Positive for weakness (generalized). Negative for dizziness,  light-headedness and headaches.  All other systems reviewed and are negative.     Allergies  Codeine  Home Medications   Prior to Admission medications   Medication Sig Start Date End Date Taking? Authorizing Provider  albuterol (PROVENTIL HFA;VENTOLIN HFA) 108 (90 BASE) MCG/ACT inhaler Inhale 2 puffs into the lungs every 6 (six) hours as needed for wheezing or shortness of breath.   Yes Historical Provider, MD  aspirin EC 81 MG tablet Take 81 mg by mouth daily.   Yes Historical Provider, MD  atorvastatin (LIPITOR) 80 MG tablet TAKE 1 TABLET DAILY AT 6PM (MUST KEEP APPT WITH DR.KATZ) 08/21/14  Yes Carlena Bjornstad, MD  carvedilol (COREG) 12.5 MG tablet TAKE 1 TABLET (12.5 MG TOTAL) BY MOUTH 2 (TWO) TIMES DAILY WITH A MEAL. Patient taking differently: TAKE 1 TABLET BY MOUTH 2 (TWO) TIMES DAILY WITH A MEAL. 03/26/15  Yes Carlena Bjornstad, MD  Cholecalciferol (VITAMIN D) 2000 UNITS tablet Take 2,000 Units by mouth daily.   Yes Historical Provider, MD  clonazePAM (KLONOPIN) 1 MG tablet Take 1 mg by mouth 3 (three) times daily as needed for anxiety.   Yes Historical Provider, MD  clopidogrel (PLAVIX) 75 MG tablet Take 1 tablet (75 mg total) by mouth daily with breakfast. 08/21/14  Yes Shaune Pascal Bensimhon, MD  Fe Cbn-Fe Gluc-FA-B12-C-DSS (FERRALET 90) 90-1 MG TABS Take 1 tablet by mouth daily. 05/18/15  Yes Historical Provider, MD  fish oil-omega-3 fatty acids 1000 MG capsule Take 1 g by mouth daily.    Yes Historical Provider, MD  insulin NPH-regular Human (NOVOLIN 70/30) (70-30) 100 UNIT/ML injection Inject into the skin 3 (three) times daily with meals. 75 units in the morning, and 50 with lunch and 12 in the evening   Yes Historical Provider, MD  isosorbide dinitrate (ISORDIL) 10 MG tablet TAKE 1 TABLET (10 MG TOTAL) BY MOUTH 2 (TWO) TIMES DAILY. 02/27/15  Yes Carlena Bjornstad, MD  levothyroxine (SYNTHROID, LEVOTHROID) 50 MCG tablet Take 50 mcg by mouth daily before breakfast.   Yes Historical Provider,  MD  losartan (COZAAR) 50 MG tablet TAKE 1 TABLET BY MOUTH DAILY 03/26/15  Yes [name], PA-C  metolazone (ZAROXOLYN) 2.5 MG tablet Take one every Wednesday and Sunday Patient taking differently: Take 2.5 mg by mouth as directed. Take one every Wednesday and Sunday 05/08/15  Yes Amy D Clegg, NP  Multiple Vitamin (MULTIVITAMIN WITH MINERALS) TABS tablet Take 1 tablet by mouth daily.   Yes Historical Provider, MD  ondansetron (ZOFRAN ODT) 4 MG disintegrating tablet Take 1 tablet (4 mg total) by mouth every 8 (eight) hours as needed for nausea or vomiting. 03/16/15  Yes Alycia Rossetti, MD  PARoxetine (PAXIL-CR) 25 MG 24 hr tablet TAKE 1 TABLET (25 MG TOTAL) BY MOUTH DAILY.  TAKEN WITH 37.5 FOR A TOTAL = 62.5MG  05/10/15  Yes [name], PA-C  PARoxetine (PAXIL-CR) 37.5 MG 24 hr tablet TAKE 1 TABLET (37.5 MG TOTAL) BY MOUTH DAILY. TAKEN WITH 25MG  FOR A TOTAL = 62.5 MG 04/10/15  Yes Mary B Dixon, PA-C  potassium chloride SA (KLOR-CON M20) 20 MEQ tablet Take 2 tablets (40 mEq total) by mouth 2 (two) times daily. 05/01/15  Yes Amy D Clegg, NP  pregabalin (LYRICA) 150 MG capsule Take 150 mg by mouth 3 (three) times daily.    Yes Historical Provider, MD  promethazine (PHENERGAN) 25 MG tablet Take 1 tablet (25 mg total) by mouth every 6 (six) hours as needed for nausea or vomiting. 03/08/15  Yes Mary B Dixon, PA-C  torsemide (DEMADEX) 20 MG tablet Take 40-80 mg by mouth 2 (two) times daily. Take 4 tablets (80 mg) in the morning and 2 tablets (40 mg) in the afternoon   Yes Historical Provider, MD  TRADJENTA 5 MG TABS tablet Take 5 mg by mouth daily. 05/31/15  Yes Historical Provider, MD  hydrocortisone (ANUSOL-HC) 25 MG suppository Place 1 suppository (25 mg total) rectally 2 (two) times daily. Patient not taking: Reported on 06/07/2015 04/18/14   [name], PA-C  nitroGLYCERIN (NITROSTAT) 0.4 MG SL tablet Place 1 tablet (0.4 mg total) under the tongue every 5 (five) minutes as needed for chest pain.  05/08/15   Amy D Ninfa Meeker, NP  ONE TOUCH ULTRA TEST test strip TEST 3 TIMES A DAY AND AS NEEDED 02/21/15   Historical Provider, MD   BP 142/67 mmHg  Pulse 84  Temp(Src) 97.9 F (36.6 C) (Oral)  Resp 12  Ht 5\' 6"  (1.676 m)  Wt 293 lb (132.904 kg)  BMI 47.31 kg/m2  SpO2 96% Physical Exam  Constitutional: She is oriented to person, place, and time. She appears well-developed and well-nourished. No distress.  Dry appearing  HENT:  Head: Normocephalic and atraumatic.  Mouth/Throat: Oropharynx is clear and moist.  Dry mucous membranes  Eyes: EOM are normal. Pupils are equal, round, and reactive to light.  Neck: Normal range of motion. Neck supple.  No meningismus  Cardiovascular: Normal rate and regular rhythm.  Exam reveals no gallop and no friction rub.   No murmur heard. Pulmonary/Chest: Effort normal and breath sounds normal. No respiratory distress. She has no wheezes. She has no rales. She exhibits no tenderness.  Abdominal: Soft. Bowel sounds are normal. She exhibits no distension and no mass. There is no tenderness. There is no rebound and no guarding.  Musculoskeletal: Normal range of motion. She exhibits no edema or tenderness.  Bilateral lower extremity edema with ulcerations in the pretibial area. There is some mild surrounding erythema without definite evidence of cellulitis. No asymmetry. Distal pulses intact.  Neurological: She is alert and oriented to person, place, and time.  Moves all extremities without focal deficit. Sensation is intact.  Skin: Skin is warm and dry. No rash noted. No erythema.  Psychiatric: She has a normal mood and affect. Her behavior is normal.  Nursing note and vitals reviewed.   ED Course  Procedures (including critical care time) Labs Review Labs Reviewed  CBC WITH DIFFERENTIAL/PLATELET - Abnormal; Notable for the following:    Hemoglobin 9.9 (*)    HCT 33.3 (*)    MCV 76.6 (*)    MCH 22.8 (*)    MCHC 29.7 (*)    RDW 16.5 (*)    All other  components within normal limits  COMPREHENSIVE METABOLIC PANEL - Abnormal; Notable for the following:    Sodium 131 (*)    Chloride 96 (*)    Glucose, Bld 657 (*)    BUN 39 (*)    Creatinine, Ser 1.91 (*)    Albumin 3.0 (*)    GFR calc non Af Amer 26 (*)    GFR calc Af Amer 30 (*)    All other components within normal limits  URINALYSIS, ROUTINE W REFLEX MICROSCOPIC (NOT AT [facility]) - Abnormal; Notable for the following:    Glucose, UA >1000 (*)    Hgb urine dipstick SMALL (*)    Protein, ur 100 (*)    All other components within normal limits  URINE MICROSCOPIC-ADD ON - Abnormal; Notable for the following:    Squamous Epithelial / LPF 0-5 (*)    Bacteria, UA FEW (*)    All other components within normal limits  CBG MONITORING, ED - Abnormal; Notable for the following:    Glucose-Capillary 553 (*)    All other components within normal limits  GASTROINTESTINAL PANEL BY PCR, STOOL (REPLACES STOOL CULTURE)  BRAIN NATRIURETIC PEPTIDE  I-STAT TROPOININ, ED  CBG MONITORING, ED    Imaging Review Dg Chest 2 View  06/07/2015  CLINICAL DATA:  Nausea, vomiting, diarrhea. Shortness of breath. Dry cough. EXAM: CHEST  2 VIEW COMPARISON:  03/30/2014 FINDINGS: The heart size and mediastinal contours are within normal limits. Both lungs are clear. The visualized skeletal structures are unremarkable. IMPRESSION: No active cardiopulmonary disease. Electronically Signed   By: Kathreen Devoid   On: 06/07/2015 15:00   I have personally reviewed and evaluated these images and lab results as part of my medical decision-making.   EKG Interpretation None      MDM   Final diagnoses:  Hyperglycemia   Discussed with hospitalist and will admit for hyperglycemia and dehydration. No evidence of DKA.     Julianne Rice, MD 06/07/15 [id number]

## 2015-06-07 NOTE — Progress Notes (Signed)
Patient ID: Susan [name] MRN: [id number], DOB: [birth year], 65 y.o. Date of Encounter: @DATE @  Chief Complaint:  Chief Complaint  Patient presents with  . sick x 1 week    n/v/d can't keep anything down, lower leg wounds, says Dr Bubba Camp is managing her sugars while she is sick    HPI: 65 y.o. year old female  presents with above.   She has extensive past medical history but most pertinent history includes diabetes--on insulin, and renal insufficiency.  She states that the symptoms started Friday, January 6. She has had vomiting and diarrhea every day since then. She has not been able to keep down any liquid or food.  Says she has been calling her endocrinologist Dr. Soyla Murphy and giving her her blood sugar readings and she has been adjusting her insulin since she has been sick.  Patient tells me that she last checked her blood sugar at 11:30 AM and at that time it was 554.  Also she has redness on both lower legs and open wounds on both lower legs.   Past Medical History  Diagnosis Date  . Tachycardia     Sinus tachycardia  . Dyslipidemia   . Hypothyroidism   . CAD (coronary artery disease)     MI in 2000 - MI  2007 - treated bare metal stent (no nuclear since then as 9/11)  . HTN (hypertension)   . Chronic diastolic heart failure (HCC)     a) ECHO (08/2013) EF 55-60% and RV function nl b) RHC (08/2013) RA 4, RV 30/5/7, PA 25/10 (16), PCWP 7, Fick CO/CI 6.3/2.7, PVR 1.5 WU, PA 61 and 66%  . RBBB (right bundle branch block)     Old  . Urinary incontinence   . Obesity   . Syncope     likely due to low blood sugar  . Carotid artery disease ([place])   . Acute MI ([place]) 1999; 2007  . CHF (congestive heart failure) ([place])   . Anxiety   . Depression   . Type II diabetes mellitus ([place])   . Peripheral neuropathy ([place])   . Venous insufficiency   . Stroke [facility])     mini strokes  . PONV (postoperative nausea and vomiting)   . Renal insufficiency     DENIES  . Anemia     hx  .  Anginal pain ([place])   . Exertional shortness of breath   . Daily headache     "~ every other day; since I fell in June" (03/15/2014)  . Osteoarthritis   . Arthritis     "generalized" (03/15/2014)     Home Meds: Outpatient Prescriptions Prior to Visit  Medication Sig Dispense Refill  . albuterol (PROVENTIL HFA;VENTOLIN HFA) 108 (90 BASE) MCG/ACT inhaler Inhale 2 puffs into the lungs every 6 (six) hours as needed for wheezing or shortness of breath.    Marland Kitchen aspirin EC 81 MG tablet Take 81 mg by mouth daily.    Marland Kitchen atorvastatin (LIPITOR) 80 MG tablet TAKE 1 TABLET DAILY AT 6PM (MUST KEEP APPT WITH DR.KATZ) 30 tablet 3  . carvedilol (COREG) 12.5 MG tablet TAKE 1 TABLET (12.5 MG TOTAL) BY MOUTH 2 (TWO) TIMES DAILY WITH A MEAL. 60 tablet 5  . Cholecalciferol (VITAMIN D) 2000 UNITS tablet Take 2,000 Units by mouth daily.    . clonazePAM (KLONOPIN) 1 MG tablet Take 1 mg by mouth 3 (three) times daily as needed for anxiety.    . clopidogrel (PLAVIX) 75 MG tablet Take  1 tablet (75 mg total) by mouth daily with breakfast. 90 tablet 3  . fish oil-omega-3 fatty acids 1000 MG capsule Take 1 g by mouth daily.     . hydrocortisone (ANUSOL-HC) 25 MG suppository Place 1 suppository (25 mg total) rectally 2 (two) times daily. 12 suppository 0  . insulin NPH-regular Human (NOVOLIN 70/30) (70-30) 100 UNIT/ML injection Inject into the skin 3 (three) times daily with meals. 75 units in the morning, and 50 with lunch and 12 in the evening    . isosorbide dinitrate (ISORDIL) 10 MG tablet TAKE 1 TABLET (10 MG TOTAL) BY MOUTH 2 (TWO) TIMES DAILY. 60 tablet 3  . levothyroxine (SYNTHROID, LEVOTHROID) 50 MCG tablet Take 50 mcg by mouth daily before breakfast.    . losartan (COZAAR) 50 MG tablet TAKE 1 TABLET BY MOUTH DAILY 90 tablet 1  . metFORMIN (GLUCOPHAGE-XR) 500 MG 24 hr tablet Take 500 mg by mouth 2 (two) times daily with a meal.  5  . metolazone (ZAROXOLYN) 2.5 MG tablet Take one every Wednesday and Sunday 30 tablet 6    . Multiple Vitamin (MULTIVITAMIN WITH MINERALS) TABS tablet Take 1 tablet by mouth daily.    . nitroGLYCERIN (NITROSTAT) 0.4 MG SL tablet Place 1 tablet (0.4 mg total) under the tongue every 5 (five) minutes as needed for chest pain. 25 tablet 3  . ondansetron (ZOFRAN ODT) 4 MG disintegrating tablet Take 1 tablet (4 mg total) by mouth every 8 (eight) hours as needed for nausea or vomiting. 20 tablet 1  . ONE TOUCH ULTRA TEST test strip TEST 3 TIMES A DAY AND AS NEEDED  11  . PARoxetine (PAXIL-CR) 25 MG 24 hr tablet TAKE 1 TABLET (25 MG TOTAL) BY MOUTH DAILY. TAKEN WITH 37.5 FOR A TOTAL = 62.5MG  30 tablet 5  . PARoxetine (PAXIL-CR) 37.5 MG 24 hr tablet TAKE 1 TABLET (37.5 MG TOTAL) BY MOUTH DAILY. TAKEN WITH 25MG  FOR A TOTAL = 62.5 MG 30 tablet 5  . potassium chloride SA (KLOR-CON M20) 20 MEQ tablet Take 2 tablets (40 mEq total) by mouth 2 (two) times daily. 120 tablet 6  . pregabalin (LYRICA) 150 MG capsule Take 150 mg by mouth 3 (three) times daily.     . promethazine (PHENERGAN) 25 MG tablet Take 1 tablet (25 mg total) by mouth every 6 (six) hours as needed for nausea or vomiting. 30 tablet 0  . torsemide (DEMADEX) 20 MG tablet Take 40-80 mg by mouth 2 (two) times daily. Take 4 tablets (80 mg) in the morning and 2 tablets (40 mg) in the afternoon     No facility-administered medications prior to visit.    Allergies:  Allergies  Allergen Reactions  . Codeine Nausea And Vomiting    Social History   Social History  . Marital Status: Married    Spouse Name: N/A  . Number of Children: 3  . Years of Education: 12th   Occupational History  .      unemploye   Social History Main Topics  . Smoking status: Former Smoker -- 3.00 packs/day for 32 years    Types: Cigarettes    Quit date: 10/24/1997  . Smokeless tobacco: Never Used  . Alcohol Use: Yes     Comment: "might have 2-3 daiquiris in the summer"  . Drug Use: No  . Sexual Activity: No   Other Topics Concern  . Not on file    Social History Narrative   Pt lives at home with her spouse.  Caffeine Use- 3 sodas daily    Family History  Problem Relation Age of Onset  . Coronary artery disease    . Heart attack Mother      Review of Systems:  See HPI for pertinent ROS. All other ROS negative.    Physical Exam: Blood pressure 118/60, pulse 88, temperature 97.8 F (36.6 C), temperature source Oral, resp. rate 20., There is no weight on file to calculate BMI. General: Obese white female. Appears in no acute distress. Neck: Supple. No thyromegaly. No lymphadenopathy. Lungs: Clear bilaterally to auscultation without wheezes, rales, or rhonchi. Breathing is unlabored. Heart: RRR with S1 S2. No murmurs, rubs, or gallops. Abdomen: Soft,  non-distended with normoactive bowel sounds. No hepatomegaly. No rebound/guarding. No obvious abdominal masses.Diffuse mild tenderness with palpation. Musculoskeletal:  Strength and tone normal for age. Extremities/Skin: Bilateral lower legs with diffuse areas of erythema and multiple open wounds.  Neuro: Alert and oriented X 3. Moves all extremities spontaneously. Gait is normal. CNII-XII grossly in tact. Psych:  Responds to questions appropriately with a normal affect.     ASSESSMENT AND PLAN:  66 y.o. year old female with  1. Type 2 diabetes mellitus with complication, with long-term current use of insulin (HCC) - Glucose, fingerstick (stat)  2. Intractable vomiting with nausea, unspecified vomiting type - Glucose, fingerstick (stat)  3. Diarrhea, unspecified type - Glucose, fingerstick (stat)  4. Renal insufficiency - Glucose, fingerstick (stat)  5. Wounds on bilateral lower legs  Lab checked blood sugar here but it will not even read. Reading just says [place]. Lab says that the equipment will not read greater than 500. Patient reports that 11:30 AM her reading at home was 554. Suspect dehydration and acute on chronic kidney disease.  Patient states that she drove  herself here and has no one that she can call to take her to the emergency room. EMS called to transport patient to emergency room. Patient left the office in stable condition with EMS staff.   [address] [place], [place], [facility] 06/07/2015 1:04 PM

## 2015-06-07 NOTE — ED Notes (Signed)
Attempted report 

## 2015-06-07 NOTE — Progress Notes (Signed)
Admission note:  Arrival Method: bed Mental Orientation: alert & oriented x 4  Telemetry: not ordered  Assessment: in progress  IV: right FA  Pain: pt denies  Tubes: N/A Safety Measures: Patient Handbook has been given, and discussed the Fall Prevention worksheet. Left at bedside  Admission: In progress and admission orders have been written  6E Orientation: Patient has been oriented to the unit, staff and to the room.    Kaynan Klonowski [employer], RN [employer] Phone [phone]

## 2015-06-07 NOTE — ED Notes (Signed)
EMS transport from PCP office. C/O N/V/D X 1 week. Pt alert and able to transport from stretcher to bed. CBG reading high. b/p 136/82 HR 90

## 2015-06-07 NOTE — H&P (Signed)
Patient Demographics  Susan [name], is a 65 y.o. female  MRN: [id number]   DOB - 09-26-1950  Admit Date - 06/07/2015  Outpatient Primary MD for the patient is Frazier Richards, PA-C   With History of -  Past Medical History  Diagnosis Date  . Tachycardia     Sinus tachycardia  . Dyslipidemia   . Hypothyroidism   . CAD (coronary artery disease)     MI in 2000 - MI  2007 - treated bare metal stent (no nuclear since then as 9/11)  . HTN (hypertension)   . Chronic diastolic heart failure (HCC)     a) ECHO (08/2013) EF 55-60% and RV function nl b) RHC (08/2013) RA 4, RV 30/5/7, PA 25/10 (16), PCWP 7, Fick CO/CI 6.3/2.7, PVR 1.5 WU, PA 61 and 66%  . RBBB (right bundle branch block)     Old  . Urinary incontinence   . Obesity   . Syncope     likely due to low blood sugar  . Carotid artery disease (HCC)   . Acute MI (HCC) 1999; 2007  . CHF (congestive heart failure) (HCC)   . Anxiety   . Depression   . Type II diabetes mellitus (HCC)   . Peripheral neuropathy (HCC)   . Venous insufficiency   . Stroke [facility])     mini strokes  . PONV (postoperative nausea and vomiting)   . Renal insufficiency     DENIES  . Anemia     hx  . Anginal pain (HCC)   . Exertional shortness of breath   . Daily headache     "~ every other day; since I fell in June" (03/15/2014)  . Osteoarthritis   . Arthritis     "generalized" (03/15/2014)      Past Surgical History  Procedure Laterality Date  . Knee arthroscopy Left 10/25/2006  . Coronary angioplasty with stent placement  1999; 2007    "1 + 1"  . Abdominal hysterectomy  1980's  . Tubal ligation  1970's  . Cataract extraction, bilateral Bilateral ?2013  . Shoulder arthroscopy w/ rotator cuff repair Right 03/14/2014  . Shoulder arthroscopy with open rotator cuff repair Right 03/14/2014    Procedure: RIGHT SHOULDER ARTHROSCOPY WITH BICEPS RELEASE, OPEN SUBSCAPULA REPAIR, OPEN SUPRASPINATUS REPAIR.;  Surgeon: Cammy Copa, MD;   Location: [facility] OR;  Service: Orthopedics;  Laterality: Right;  . Right heart catheterization N/A 09/22/2013    Procedure: RIGHT HEART CATH;  Surgeon: Dolores Patty, MD;  Location: [facility] CATH LAB;  Service: Cardiovascular;  Laterality: N/A;    in for   Chief Complaint  Patient presents with  . Hyperglycemia     HPI  Susan [name]  is a 65 y.o. female, with Past medical history of diabetes mellitus, coronary artery disease, hypothyroidism, chronic diastolic dysfunction, RBBB, hyperlipidemia presents with complaints of diarrhea, nausea vomiting over the last week, denies any sick contacts, symptoms worsened over the last 2 days , as well reports poor oral intake ,denies fever or chills, no coffee-ground emesis, no bright red blood per rectum, in  ED, workup significant for hyperglycemia, worsening renal function, afebrile, no leukocytosis, blood glucose at 657, which significantly improved with gentle hydration, hospitalist requested to admit for management of her hyperglycemia, and dehydration.    Review of Systems    In addition to the HPI above,  No Fever-but does report some chills chills, No Headache, No changes with Vision or hearing, No problems swallowing food or  Liquids, No Chest pain, Cough or Shortness of Breath, No Abdominal pain, planes of nausea vomiting and diarrhea No Blood in stool or Urine, No dysuria, No new skin rashes or bruises, No new joints pains-aches,  No new weakness, tingling, numbness in any extremity, No recent weight gain or loss, No polyuria, polydypsia or polyphagia, No significant Mental Stressors.  A full 10 point Review of Systems was done, except as stated above, all other Review of Systems were negative.   Social History Social History  Substance Use Topics  . Smoking status: Former Smoker -- 3.00 packs/day for 32 years    Types: Cigarettes    Quit date: 10/24/1997  . Smokeless tobacco: Never Used  . Alcohol Use: Yes     Comment: "might  have 2-3 daiquiris in the summer"     Family History Family History  Problem Relation Age of Onset  . Coronary artery disease    . Heart attack Mother      Prior to Admission medications   Medication Sig Start Date End Date Taking? Authorizing Provider  albuterol (PROVENTIL HFA;VENTOLIN HFA) 108 (90 BASE) MCG/ACT inhaler Inhale 2 puffs into the lungs every 6 (six) hours as needed for wheezing or shortness of breath.   Yes Historical Provider, MD  aspirin EC 81 MG tablet Take 81 mg by mouth daily.   Yes Historical Provider, MD  atorvastatin (LIPITOR) 80 MG tablet TAKE 1 TABLET DAILY AT 6PM (MUST KEEP APPT WITH DR.KATZ) 08/21/14  Yes Luis Abed, MD  carvedilol (COREG) 12.5 MG tablet TAKE 1 TABLET (12.5 MG TOTAL) BY MOUTH 2 (TWO) TIMES DAILY WITH A MEAL. Patient taking differently: TAKE 1 TABLET BY MOUTH 2 (TWO) TIMES DAILY WITH A MEAL. 03/26/15  Yes Luis Abed, MD  Cholecalciferol (VITAMIN D) 2000 UNITS tablet Take 2,000 Units by mouth daily.   Yes Historical Provider, MD  clonazePAM (KLONOPIN) 1 MG tablet Take 1 mg by mouth 3 (three) times daily as needed for anxiety.   Yes Historical Provider, MD  clopidogrel (PLAVIX) 75 MG tablet Take 1 tablet (75 mg total) by mouth daily with breakfast. 08/21/14  Yes Bevelyn Buckles Bensimhon, MD  Fe Cbn-Fe Gluc-FA-B12-C-DSS (FERRALET 90) 90-1 MG TABS Take 1 tablet by mouth daily. 05/18/15  Yes Historical Provider, MD  fish oil-omega-3 fatty acids 1000 MG capsule Take 1 g by mouth daily.    Yes Historical Provider, MD  insulin NPH-regular Human (NOVOLIN 70/30) (70-30) 100 UNIT/ML injection Inject into the skin 3 (three) times daily with meals. 75 units in the morning, and 50 with lunch and 12 in the evening   Yes Historical Provider, MD  isosorbide dinitrate (ISORDIL) 10 MG tablet TAKE 1 TABLET (10 MG TOTAL) BY MOUTH 2 (TWO) TIMES DAILY. 02/27/15  Yes Luis Abed, MD  levothyroxine (SYNTHROID, LEVOTHROID) 50 MCG tablet Take 50 mcg by mouth daily before  breakfast.   Yes Historical Provider, MD  losartan (COZAAR) 50 MG tablet TAKE 1 TABLET BY MOUTH DAILY 03/26/15  Yes Dorena Bodo, PA-C  metolazone (ZAROXOLYN) 2.5 MG tablet Take one every Wednesday and Sunday Patient taking differently: Take 2.5 mg by mouth as directed. Take one every Wednesday and Sunday 05/08/15  Yes Amy D Clegg, NP  Multiple Vitamin (MULTIVITAMIN WITH MINERALS) TABS tablet Take 1 tablet by mouth daily.   Yes Historical Provider, MD  ondansetron (ZOFRAN ODT) 4 MG disintegrating tablet Take 1 tablet (4 mg total) by mouth every 8 (eight) hours as needed for nausea or  vomiting. 03/16/15  Yes Salley Scarlet, MD  PARoxetine (PAXIL-CR) 25 MG 24 hr tablet TAKE 1 TABLET (25 MG TOTAL) BY MOUTH DAILY. TAKEN WITH 37.5 FOR A TOTAL = 62.5MG  05/10/15  Yes Dorena Bodo, PA-C  PARoxetine (PAXIL-CR) 37.5 MG 24 hr tablet TAKE 1 TABLET (37.5 MG TOTAL) BY MOUTH DAILY. TAKEN WITH 25MG  FOR A TOTAL = 62.5 MG 04/10/15  Yes Mary B Dixon, PA-C  potassium chloride SA (KLOR-CON M20) 20 MEQ tablet Take 2 tablets (40 mEq total) by mouth 2 (two) times daily. 05/01/15  Yes Amy D Clegg, NP  pregabalin (LYRICA) 150 MG capsule Take 150 mg by mouth 3 (three) times daily.    Yes Historical Provider, MD  promethazine (PHENERGAN) 25 MG tablet Take 1 tablet (25 mg total) by mouth every 6 (six) hours as needed for nausea or vomiting. 03/08/15  Yes Mary B Dixon, PA-C  torsemide (DEMADEX) 20 MG tablet Take 40-80 mg by mouth 2 (two) times daily. Take 4 tablets (80 mg) in the morning and 2 tablets (40 mg) in the afternoon   Yes Historical Provider, MD  TRADJENTA 5 MG TABS tablet Take 5 mg by mouth daily. 05/31/15  Yes Historical Provider, MD  hydrocortisone (ANUSOL-HC) 25 MG suppository Place 1 suppository (25 mg total) rectally 2 (two) times daily. Patient not taking: Reported on 06/07/2015 04/18/14   Dorena Bodo, PA-C  nitroGLYCERIN (NITROSTAT) 0.4 MG SL tablet Place 1 tablet (0.4 mg total) under the tongue every 5 (five)  minutes as needed for chest pain. 05/08/15   Amy D Filbert Schilder, NP  ONE TOUCH ULTRA TEST test strip TEST 3 TIMES A DAY AND AS NEEDED 02/21/15   Historical Provider, MD    Allergies  Allergen Reactions  . Codeine Nausea And Vomiting    Physical Exam  Vitals  Blood pressure 145/59, pulse 95, temperature 97.9 F (36.6 C), temperature source Oral, resp. rate 22, height 5\' 6"  (1.676 m), weight 132.904 kg (293 lb), SpO2 96 %.   1. General well-developed obese female lying in bed in NAD,    2. Normal affect and insight, Not Suicidal or Homicidal, Awake Alert, Oriented X 3.  3. No F.N deficits, ALL C.Nerves Intact, Strength 5/5 all 4 extremities, Sensation intact all 4 extremities, Plantars down going.  4. Ears and Eyes appear Normal, Conjunctivae clear, PERRLA. Moist Oral Mucosa.  5. Supple Neck, No JVD, No cervical lymphadenopathy appriciated, No Carotid Bruits.  6. Symmetrical Chest wall movement, Good air movement bilaterally, CTAB.  7. RRR, No Gallops, Rubs or Murmurs, No Parasternal Heave.  8. Positive Bowel Sounds, Abdomen Soft, No tenderness, No organomegaly appriciated,No rebound -guarding or rigidity.  9.  No Cyanosis, Normal Skin Turgor, chronic lower extremity discoloration with chronic drying wound.  10. Good muscle tone,  joints appear normal , no effusions, Normal ROM.  11. No Palpable Lymph Nodes in Neck or Axillae  Data Review  CBC  Recent Labs Lab 06/07/15 1356  WBC 4.8  HGB 9.9*  HCT 33.3*  PLT 248  MCV 76.6*  MCH 22.8*  MCHC 29.7*  RDW 16.5*  LYMPHSABS 0.7  MONOABS 0.3  EOSABS 0.1  BASOSABS 0.0   ------------------------------------------------------------------------------------------------------------------  Chemistries   Recent Labs Lab 06/07/15 1311 06/07/15 1356  NA  --  131*  K  --  4.4  CL  --  96*  CO2  --  22  GLUCOSE >444* 657*  BUN  --  39*  CREATININE  --  1.91*  CALCIUM  --  8.9  AST  --  17  ALT  --  15  ALKPHOS  --  108    BILITOT  --  0.7   ------------------------------------------------------------------------------------------------------------------ estimated creatinine clearance is 40.6 mL/min (by C-G formula based on Cr of 1.91). ------------------------------------------------------------------------------------------------------------------ No results for input(s): TSH, T4TOTAL, T3FREE, THYROIDAB in the last 72 hours.  Invalid input(s): FREET3   Coagulation profile No results for input(s): INR, PROTIME in the last 168 hours. ------------------------------------------------------------------------------------------------------------------- No results for input(s): DDIMER in the last 72 hours. -------------------------------------------------------------------------------------------------------------------  Cardiac Enzymes No results for input(s): CKMB, TROPONINI, MYOGLOBIN in the last 168 hours.  Invalid input(s): CK ------------------------------------------------------------------------------------------------------------------ Invalid input(s): POCBNP   ---------------------------------------------------------------------------------------------------------------  Urinalysis    Component Value Date/Time   COLORURINE YELLOW 06/07/2015 1455   APPEARANCEUR CLEAR 06/07/2015 1455   LABSPEC 1.026 06/07/2015 1455   PHURINE 5.5 06/07/2015 1455   GLUCOSEU >1000* 06/07/2015 1455   HGBUR SMALL* 06/07/2015 1455   BILIRUBINUR NEGATIVE 06/07/2015 1455   KETONESUR NEGATIVE 06/07/2015 1455   PROTEINUR 100* 06/07/2015 1455   UROBILINOGEN 0.2 04/01/2014 1659   NITRITE NEGATIVE 06/07/2015 1455   LEUKOCYTESUR NEGATIVE 06/07/2015 1455    ----------------------------------------------------------------------------------------------------------------  Imaging results:   Dg Chest 2 View  06/07/2015  CLINICAL DATA:  Nausea, vomiting, diarrhea. Shortness of breath. Dry cough. EXAM: CHEST  2 VIEW  COMPARISON:  03/30/2014 FINDINGS: The heart size and mediastinal contours are within normal limits. Both lungs are clear. The visualized skeletal structures are unremarkable. IMPRESSION: No active cardiopulmonary disease. Electronically Signed   By: Elige Ko   On: 06/07/2015 15:00    My personal review of EKG: Rhythm NSR, RBBB, Rate  92/min, QTc 465     Assessment & Plan  Active Problems:   Gastroenteritis   Hyperlipemia   Essential hypertension   CAD (coronary artery disease)   Acute on chronic renal failure (HCC)   Diabetes mellitus type II, uncontrolled (HCC)   Chronic diastolic CHF (congestive heart failure) (HCC)  Nausea/ vomiting /diarrhea - This is most likely due to gastroenteritis, most likely viral etiology, will check GI pathogen panel. - Continue with gentle hydration - Patient remains nothing by mouth, can be started on diet of no recurrence of vomiting.  Diabetes mellitus uncontrolled - Initial blood glucose and 500s range, significantly improved with gentle hydration, will resume back on insulin sliding scale, will hold oral hypoglycemic agent, will resume on insulin NPH lower dose of 35 units twice a day given she remains nothing by mouth.  Hypertension -Continue home medication, hold diuresis and losartan given worsening renal failure.  Acute on chronic renal failure - Hold her diuretics, and losartan - Tender with gentle hydration, will monitor closely for volume overload  Chronic diastolic CHF - Patient had some mild lower extremity edema, but no overt volume overload, likely dehydrated, will continue with gentle hydration, will resume on home dose diuresis (metolazone and torsemide) when able to tolerate by mouth intake  Hyperlipidemia - Continue with statin and fish oil  Coronary artery disease - Continue with aspirin, statin, Plavix, Coreg, Imdur, resume losartan 1 renal function at baseline  DVT Prophylaxis Heparin   AM Labs Ordered, also please  review Full Orders  Family Communication: Admission, patients condition and plan of care including tests being ordered have been discussed with the patient and husband who indicate understanding and agree with the plan and Code Status.  Code Status Full  Likely DC to  home  Condition GUARDED    Time spent in minutes : 55  minutes    Sabiha Sura M.D on 06/07/2015 at 6:03 PM  Between 7am to 7pm - Pager - [phone]  After 7pm go to [email or web address] - password [email or web address]  And look for the night coverage person covering me after hours  [facility] Office  [phone]

## 2015-06-08 DIAGNOSIS — N179 Acute kidney failure, unspecified: Secondary | ICD-10-CM | POA: Diagnosis not present

## 2015-06-08 DIAGNOSIS — I5032 Chronic diastolic (congestive) heart failure: Secondary | ICD-10-CM | POA: Diagnosis not present

## 2015-06-08 DIAGNOSIS — E1165 Type 2 diabetes mellitus with hyperglycemia: Secondary | ICD-10-CM | POA: Diagnosis not present

## 2015-06-08 DIAGNOSIS — I2510 Atherosclerotic heart disease of native coronary artery without angina pectoris: Secondary | ICD-10-CM | POA: Diagnosis not present

## 2015-06-08 LAB — COMPREHENSIVE METABOLIC PANEL
ALT: 14 U/L (ref 14–54)
AST: 17 U/L (ref 15–41)
Albumin: 2.8 g/dL — ABNORMAL LOW (ref 3.5–5.0)
Alkaline Phosphatase: 94 U/L (ref 38–126)
Anion gap: 9 (ref 5–15)
BUN: 35 mg/dL — ABNORMAL HIGH (ref 6–20)
CO2: 24 mmol/L (ref 22–32)
Calcium: 8.7 mg/dL — ABNORMAL LOW (ref 8.9–10.3)
Chloride: 105 mmol/L (ref 101–111)
Creatinine, Ser: 1.77 mg/dL — ABNORMAL HIGH (ref 0.44–1.00)
GFR calc Af Amer: 33 mL/min — ABNORMAL LOW (ref >60)
GFR calc non Af Amer: 29 mL/min — ABNORMAL LOW (ref >60)
Glucose, Bld: 358 mg/dL — ABNORMAL HIGH (ref 65–99)
Potassium: 5.2 mmol/L — ABNORMAL HIGH (ref 3.5–5.1)
Sodium: 138 mmol/L (ref 135–145)
Total Bilirubin: 0.2 mg/dL — ABNORMAL LOW (ref 0.3–1.2)
Total Protein: 6.1 g/dL — ABNORMAL LOW (ref 6.5–8.1)

## 2015-06-08 LAB — GLUCOSE, CAPILLARY
Glucose-Capillary: 361 mg/dL — ABNORMAL HIGH (ref 65–99)
Glucose-Capillary: 323 mg/dL — ABNORMAL HIGH (ref 65–99)
Glucose-Capillary: 279 mg/dL — ABNORMAL HIGH (ref 65–99)

## 2015-06-08 LAB — TSH: TSH: 1.228 u[IU]/mL (ref 0.350–4.500)

## 2015-06-08 LAB — MRSA PCR SCREENING: MRSA by PCR: NEGATIVE

## 2015-06-08 MED ORDER — ACETAMINOPHEN 325 MG PO TABS
650.0000 mg | ORAL_TABLET | Freq: Four times a day (QID) | ORAL | Status: DC | PRN
  Administered 2015-06-08 – 2015-06-10 (×3): 650 mg via ORAL
  Filled 2015-06-08 (×3): qty 2

## 2015-06-08 MED ORDER — INSULIN NPH (HUMAN) (ISOPHANE) 100 UNIT/ML ~~LOC~~ SUSP
45.0000 [IU] | Freq: Two times a day (BID) | SUBCUTANEOUS | Status: DC
  Administered 2015-06-08 – 2015-06-09 (×2): 45 [IU] via SUBCUTANEOUS
  Filled 2015-06-08: qty 10

## 2015-06-08 MED ORDER — INSULIN ASPART 100 UNIT/ML ~~LOC~~ SOLN
0.0000 [IU] | Freq: Four times a day (QID) | SUBCUTANEOUS | Status: DC
  Administered 2015-06-08: 9 [IU] via SUBCUTANEOUS
  Administered 2015-06-09: 3 [IU] via SUBCUTANEOUS
  Administered 2015-06-09: 11 [IU] via SUBCUTANEOUS
  Administered 2015-06-10: 5 [IU] via SUBCUTANEOUS

## 2015-06-08 MED ORDER — FUROSEMIDE 10 MG/ML IJ SOLN
20.0000 mg | Freq: Once | INTRAMUSCULAR | Status: AC
  Administered 2015-06-08: 20 mg via INTRAVENOUS
  Filled 2015-06-08: qty 2

## 2015-06-08 MED ORDER — INSULIN ASPART PROT & ASPART (70-30 MIX) 100 UNIT/ML ~~LOC~~ SUSP
10.0000 [IU] | Freq: Once | SUBCUTANEOUS | Status: AC
  Administered 2015-06-08: 10 [IU] via SUBCUTANEOUS
  Filled 2015-06-08: qty 10

## 2015-06-08 NOTE — Progress Notes (Addendum)
[facility] PROGRESS NOTE  Susan [name] [id number] DOB: [birth year] DOA: 06/07/2015 PCP: Dena Billet BETH, PA-C  Length of stay: 1   Assessment/Plan: Active Problems:   Hyperlipemia   Essential hypertension   CAD (coronary artery disease)   Gastroenteritis   Acute on chronic renal failure (HCC)   Diabetes mellitus type II, uncontrolled (HCC)   Chronic diastolic CHF (congestive heart failure) ([place])    HPI  Susan [name] is a 65 y.o. female, with Past medical history of diabetes mellitus, coronary artery disease, hypothyroidism, chronic diastolic dysfunction, RBBB, hyperlipidemia presents with complaints of diarrhea, nausea vomiting over the last week, denies any sick contacts, symptoms worsened over the last 2 days , as well reports poor oral intake ,denies fever or chills, no coffee-ground emesis, no bright red blood per rectum, in ED, workup significant for hyperglycemia, worsening renal function, afebrile, no leukocytosis, blood glucose at 657, which significantly improved with gentle hydration, hospitalist requested to admit for management of her hyperglycemia, and dehydration.She's also had some oozing ulcerations from her lower extremity wounds.   Assessment and plan Nausea/ vomiting Susan [name] - This is most likely due to gastroenteritis, most likely viral etiology, follow GI pathogen panel. - Continue with gentle hydration Symptoms resolved and tolerating heart healthy diet   Diabetes mellitus uncontrolled, no CBGs charted this morning - Initial blood glucose and 500s range, significantly improved with gentle hydration, continue insulin sliding scale, continue to hold oral hypoglycemic agent, continue insulin NPH increased to 45 units twice a day , currently on a heart healthy diet  Hypertension -Continue coreg /imdur , hold diuresis and losartan given worsening renal failure.  Acute on chronic renal failure - Hold her diuretics, and losartan Cont  with  gentle hydration, will monitor closely for volume overload  Chronic diastolic CHF - Patient had some mild lower extremity edema, but no overt volume overload, likely dehydrated, will continue with gentle hydration, will resume on home dose diuresis (metolazone and torsemide) when able to tolerate by mouth intake, repeat 2-D echo, previous echo showed EF of 55-60% in 2014, chest x-ray shows no acute cardiopulmonary disease     Hyperlipidemia - Continue with statin and fish oil  Coronary artery disease - Continue with aspirin, statin, Plavix, Coreg, Imdur, resume losartan 1 renal function at baseline  Hypothyroidism continue Synthroid, check TSH,  Peripheral neuropathy with bilateral leg ulcers, present on admission, Wound care has seen the patient, continue dressing changes    DVT prophylaxsis SCDs  Code Status:      Code Status Orders        Start     Ordered   06/07/15 1819  Full code   Continuous     06/07/15 1818       Family Communication: Discussed in detail with the patient, all imaging results, lab results explained to the patient   Disposition Plan:  Anticipate discharge in one to 2 days,      Consultants:  Wound care    Procedures:  None  Antibiotics: Anti-infectives    None         HPI/Subjective: Complaining of shortness of breath, generalized weakness, nausea vomiting diarrhea have resolved  Objective: Filed Vitals:   06/07/15 1839 06/07/15 2100 06/08/15 0500 06/08/15 0942  BP: 134/59 143/71 152/86 141/68  Pulse: 86 85 99 94  Temp: 98.4 F (36.9 C) 98 F (36.7 C) 98.1 F (36.7 C) 98.9 F (37.2 C)  TempSrc: Oral Oral Oral Oral  Resp: 20 20  18 18  Height: 5\' 6"  (1.676 m)     Weight: 126.4 kg (278 lb 10.6 oz)     SpO2: 99% 97% 93% 94%    Intake/Output Summary (Last 24 hours) at 06/08/15 1443 Last data filed at 06/08/15 0900  Gross per 24 hour  Intake 1817.5 ml  Output    550 ml  Net 1267.5 ml    Exam:  General: No  acute respiratory distress Lungs: Clear to auscultation bilaterally without wheezes or crackles Cardiovascular: Regular rate and rhythm without murmur gallop or rub normal S1 and S2 Abdomen: Nontender, nondistended, soft, bowel sounds positive, no rebound, no ascites, no appreciable mass Extremities: Bilateral lower extremity wounds covered with wraps     Data Review   Micro Results Recent Results (from the past 240 hour(s))  MRSA PCR Screening     Status: None   Collection Time: 06/07/15 10:57 PM  Result Value Ref Range Status   MRSA by PCR NEGATIVE NEGATIVE Final    Comment:        The GeneXpert MRSA Assay (FDA approved for NASAL specimens only), is one component of a comprehensive MRSA colonization surveillance program. It is not intended to diagnose MRSA infection nor to guide or monitor treatment for MRSA infections.     Radiology Reports Dg Chest 2 View  06/07/2015  CLINICAL DATA:  Nausea, vomiting, diarrhea. Shortness of breath. Dry cough. EXAM: CHEST  2 VIEW COMPARISON:  03/30/2014 FINDINGS: The heart size and mediastinal contours are within normal limits. Both lungs are clear. The visualized skeletal structures are unremarkable. IMPRESSION: No active cardiopulmonary disease. Electronically Signed   By: Kathreen Devoid   On: 06/07/2015 15:00     CBC  Recent Labs Lab 06/07/15 1356 06/07/15 2009  WBC 4.8 4.4  HGB 9.9* 9.4*  HCT 33.3* 30.5*  PLT 248 228  MCV 76.6* 74.6*  MCH 22.8* 23.0*  MCHC 29.7* 30.8  RDW 16.5* 16.3*  LYMPHSABS 0.7  --   MONOABS 0.3  --   EOSABS 0.1  --   BASOSABS 0.0  --     Chemistries   Recent Labs Lab 06/07/15 1311 06/07/15 1356 06/07/15 2009  NA  --  131*  --   K  --  4.4  --   CL  --  96*  --   CO2  --  22  --   GLUCOSE >444* 657*  --   BUN  --  39*  --   CREATININE  --  1.91* 1.57*  CALCIUM  --  8.9  --   AST  --  17  --   ALT  --  15  --   ALKPHOS  --  108  --   BILITOT  --  0.7  --     ------------------------------------------------------------------------------------------------------------------ estimated creatinine clearance is 47.9 mL/min (by C-G formula based on Cr of 1.57). ------------------------------------------------------------------------------------------------------------------ No results for input(s): HGBA1C in the last 72 hours. ------------------------------------------------------------------------------------------------------------------ No results for input(s): CHOL, HDL, LDLCALC, TRIG, CHOLHDL, LDLDIRECT in the last 72 hours. ------------------------------------------------------------------------------------------------------------------ No results for input(s): TSH, T4TOTAL, T3FREE, THYROIDAB in the last 72 hours.  Invalid input(s): FREET3 ------------------------------------------------------------------------------------------------------------------ No results for input(s): VITAMINB12, FOLATE, FERRITIN, TIBC, IRON, RETICCTPCT in the last 72 hours.  Coagulation profile No results for input(s): INR, PROTIME in the last 168 hours.  No results for input(s): DDIMER in the last 72 hours.  Cardiac Enzymes No results for input(s): CKMB, TROPONINI, MYOGLOBIN in the last 168 hours.  Invalid input(s): CK ------------------------------------------------------------------------------------------------------------------  Invalid input(s): POCBNP   CBG:  Recent Labs Lab 06/07/15 1349 06/07/15 1635 06/07/15 1758 06/07/15 2149  GLUCAP 553* 330* 312* 128*       Studies: Dg Chest 2 View  06/07/2015  CLINICAL DATA:  Nausea, vomiting, diarrhea. Shortness of breath. Dry cough. EXAM: CHEST  2 VIEW COMPARISON:  03/30/2014 FINDINGS: The heart size and mediastinal contours are within normal limits. Both lungs are clear. The visualized skeletal structures are unremarkable. IMPRESSION: No active cardiopulmonary disease. Electronically Signed   By:  Kathreen Devoid   On: 06/07/2015 15:00      Lab Results  Component Value Date   HGBA1C 11.1* 03/31/2014   HGBA1C 11.1* 09/20/2013   HGBA1C 14.4* 12/25/2012   Lab Results  Component Value Date   CREATININE 1.57* 06/07/2015       Scheduled Meds: . aspirin EC  81 mg Oral Daily  . atorvastatin  80 mg Oral q1800  . carvedilol  12.5 mg Oral BID WC  . cholecalciferol  2,000 Units Oral Daily  . clopidogrel  75 mg Oral Q breakfast  . ferrous Q000111Q C-folic acid  1 capsule Oral Q breakfast  . heparin  5,000 Units Subcutaneous 3 times per day  . insulin aspart  0-15 Units Subcutaneous TID WC  . insulin NPH Human  35 Units Subcutaneous QPM  . insulin NPH Human  35 Units Subcutaneous QAC breakfast  . isosorbide dinitrate  10 mg Oral BID  . levothyroxine  50 mcg Oral QAC breakfast  . multivitamin with minerals  1 tablet Oral Daily  . omega-3 acid ethyl esters  1 g Oral Daily  . PARoxetine  37.5 mg Oral Daily  . potassium chloride SA  40 mEq Oral BID  . pregabalin  150 mg Oral TID   Continuous Infusions: . sodium chloride 50 mL/hr at 06/07/15 2048    Active Problems:   Hyperlipemia   Essential hypertension   CAD (coronary artery disease)   Gastroenteritis   Acute on chronic renal failure (HCC)   Diabetes mellitus type II, uncontrolled (HCC)   Chronic diastolic CHF (congestive heart failure) ([place])    Time spent: 45 minutes   [facility] Pager [phone]. If 7PM-7AM, please contact night-coverage at [email or web address], password [facility] 06/08/2015, 2:43 PM  LOS: 1 day

## 2015-06-08 NOTE — Clinical Documentation Improvement (Signed)
Hospitalist  Can the diagnosis of CKD be further specified?   CKD Stage I - GFR greater than or equal to 90  CKD Stage II - GFR 60-89  CKD Stage III - GFR 30-59  CKD Stage IV - GFR 15-29  CKD Stage V - GFR < 15  ESRD (End Stage Renal Disease)  Other condition  Unable to clinically determine   Supporting Information: : (risk factors, signs and symptoms, diagnostics, treatment) Acute on chronic renal failure per July 06, 2015 progress notes.                   (CRF codes to CKD). 2022-07-05: Bun: 39.    Creat: 1.91.     GFR: 26.   Please exercise your independent, professional judgment when responding. A specific answer is not anticipated or expected.   Thank You, [place] [phone]

## 2015-06-08 NOTE — Consult Note (Addendum)
WOC wound consult note Reason for Consult: Consult requested for bilat legs.  Pt states she was previously followed by the outpatient wound center and was wearing Una boots to control edema but had to stop going related to expenses.  She requests guidance on long term management of legs. Wound type: Generalized edema and erythremia to bilat legs.  Pt has patchy areas of dry scabbed skin and partial thickness skin loss in several locations to middle calf areas.  Appearance consistent with venous stasis changes and ulcers.  Pt states she has compression stockings at home which she can wear. Measurement: Left calf with 3 areas of dark puple-black scabbed areas, no odor or drainage.  .3X.3cm, .2X.2cm, .1X.1cm. Right leg with scattered patchy area of small scabs of the same appearance, each .2X.2cm or smaller, spread across area approx 3X3cm Dressing procedure/placement/frequency: Provided patient with a free sample of Medihoney. This topical treatment is not avaible in the [place]; it assists with enzymatic debridement and provides antimicrobial benefits and she can continue using after discharge. Foam dressings to protect from further injury. Ace wrap for light compression to reduce edema. Encouraged pt to resume use of compression stockings after discharge, reviewed plan of care and she denies further questions. Please re-consult if further assistance is needed.  Thank-you,  Julien Girt MSN, [place], [place], [name], [place]

## 2015-06-09 ENCOUNTER — Inpatient Hospital Stay (HOSPITAL_COMMUNITY): Payer: PPO

## 2015-06-09 DIAGNOSIS — E1165 Type 2 diabetes mellitus with hyperglycemia: Secondary | ICD-10-CM | POA: Diagnosis not present

## 2015-06-09 DIAGNOSIS — R0600 Dyspnea, unspecified: Secondary | ICD-10-CM | POA: Diagnosis not present

## 2015-06-09 DIAGNOSIS — N179 Acute kidney failure, unspecified: Secondary | ICD-10-CM | POA: Diagnosis not present

## 2015-06-09 DIAGNOSIS — I2510 Atherosclerotic heart disease of native coronary artery without angina pectoris: Secondary | ICD-10-CM | POA: Diagnosis not present

## 2015-06-09 DIAGNOSIS — I5032 Chronic diastolic (congestive) heart failure: Secondary | ICD-10-CM | POA: Diagnosis not present

## 2015-06-09 LAB — BASIC METABOLIC PANEL
Anion gap: 6 (ref 5–15)
BUN: 36 mg/dL — ABNORMAL HIGH (ref 6–20)
CO2: 26 mmol/L (ref 22–32)
Calcium: 8.7 mg/dL — ABNORMAL LOW (ref 8.9–10.3)
Chloride: 106 mmol/L (ref 101–111)
Creatinine, Ser: 1.8 mg/dL — ABNORMAL HIGH (ref 0.44–1.00)
GFR calc Af Amer: 33 mL/min — ABNORMAL LOW (ref >60)
GFR calc non Af Amer: 28 mL/min — ABNORMAL LOW (ref >60)
Glucose, Bld: 358 mg/dL — ABNORMAL HIGH (ref 65–99)
Potassium: 5.3 mmol/L — ABNORMAL HIGH (ref 3.5–5.1)
Sodium: 138 mmol/L (ref 135–145)

## 2015-06-09 LAB — CBC
HCT: 31.4 % — ABNORMAL LOW (ref 36.0–46.0)
Hemoglobin: 9.2 g/dL — ABNORMAL LOW (ref 12.0–15.0)
MCH: 22.8 pg — ABNORMAL LOW (ref 26.0–34.0)
MCHC: 29.3 g/dL — ABNORMAL LOW (ref 30.0–36.0)
MCV: 77.7 fL — ABNORMAL LOW (ref 78.0–100.0)
Platelets: 245 10*3/uL (ref 150–400)
RBC: 4.04 MIL/uL (ref 3.87–5.11)
RDW: 16.6 % — ABNORMAL HIGH (ref 11.5–15.5)
WBC: 3.6 10*3/uL — ABNORMAL LOW (ref 4.0–10.5)

## 2015-06-09 LAB — GLUCOSE, CAPILLARY
Glucose-Capillary: 329 mg/dL — ABNORMAL HIGH (ref 65–99)
Glucose-Capillary: 371 mg/dL — ABNORMAL HIGH (ref 65–99)
Glucose-Capillary: 190 mg/dL — ABNORMAL HIGH (ref 65–99)
Glucose-Capillary: 94 mg/dL (ref 65–99)

## 2015-06-09 MED ORDER — INSULIN NPH (HUMAN) (ISOPHANE) 100 UNIT/ML ~~LOC~~ SUSP
60.0000 [IU] | Freq: Two times a day (BID) | SUBCUTANEOUS | Status: DC
  Administered 2015-06-10 (×2): 60 [IU] via SUBCUTANEOUS
  Filled 2015-06-09 (×2): qty 10

## 2015-06-09 MED ORDER — INSULIN ASPART PROT & ASPART (70-30 MIX) 100 UNIT/ML ~~LOC~~ SUSP
20.0000 [IU] | Freq: Once | SUBCUTANEOUS | Status: AC
Start: 2015-06-09 — End: 2015-06-09
  Administered 2015-06-09: 20 [IU] via SUBCUTANEOUS
  Filled 2015-06-09: qty 10

## 2015-06-09 NOTE — Progress Notes (Signed)
  Echocardiogram 2D Echocardiogram has been performed.  Susan [name] 06/09/2015, 2:51 PM

## 2015-06-09 NOTE — Progress Notes (Addendum)
[facility] PROGRESS NOTE  Susan [name] [id number] DOB: [birth year] DOA: 06/07/2015 PCP: Dena Billet BETH, PA-C  Length of stay: 2   Assessment/Plan: Active Problems:   Hyperlipemia   Essential hypertension   CAD (coronary artery disease)   Gastroenteritis   Acute on chronic renal failure (HCC)   Diabetes mellitus type II, uncontrolled (HCC)   Chronic diastolic CHF (congestive heart failure) ([place])    HPI  Susan [name] is a 65 y.o. female, with Past medical history of diabetes mellitus, coronary artery disease, hypothyroidism, chronic diastolic dysfunction, RBBB, hyperlipidemia presents with complaints of diarrhea, nausea vomiting over the last week, denies any sick contacts, symptoms worsened over the last 2 days , as well reports poor oral intake ,denies fever or chills, no coffee-ground emesis, no bright red blood per rectum, in ED, workup significant for hyperglycemia, worsening renal function, afebrile, no leukocytosis, blood glucose at 657, which significantly improved with gentle hydration, hospitalist requested to admit for management of her hyperglycemia, and dehydration.She's also had some oozing ulcerations from her lower extremity wounds.   Assessment and plan Nausea/ vomiting /diarrhea , improving  - This is most likely due to gastroenteritis, most likely viral etiology, follow GI pathogen panel. Hold IVF , cont heart healthy diet      Diabetes mellitus uncontrolled, uncontrolled - Initial blood glucose and 500s range, significantly improved with gentle hydration, continue insulin sliding scale, continue to hold oral hypoglycemic agent, continue insulin NPH increased to 60 units twice a day , currently on a heart healthy diet  Hypertension -Continue coreg /imdur , hold diuresis and losartan given worsening renal failure.  Acute on chronic renal failure , stage 3  - Hold her diuretics, and losartan, baseline 1.8, improving   resume diuretics when  tolerating PO   Chronic diastolic CHF, without exacerbation - Patient had some mild lower extremity edema, but no overt volume overload,  Resume  home dose diuresis (metolazone and torsemide) when able to tolerate by mouth intake, repeat 2-D echo , prior LV EF: 55% -  60% in 4/15 ,  , chest x-ray shows no acute cardiopulmonary disease     Hyperlipidemia - Continue with statin and fish oil  Coronary artery disease - Continue with aspirin, statin, Plavix, Coreg, Imdur, resume losartan 1 renal function at baseline  Hypothyroidism continue Synthroid,   TSH 1.22 ,  Peripheral neuropathy with bilateral leg ulcers, present on admission, Wound care has seen the patient, continue dressing changes    DVT prophylaxsis SCDs  Code Status:      Code Status Orders        Start     Ordered   06/07/15 1819  Full code   Continuous     06/07/15 1818       Family Communication: Discussed in detail with the patient, all imaging results, lab results explained to the patient   Disposition Plan:  Anticipate discharge in am when CBG more stable      Consultants:  Wound care    Procedures:  None  Antibiotics: Anti-infectives    None         HPI/Subjective:   nausea vomiting diarrhea have resolved  Objective: Filed Vitals:   06/08/15 1759 06/08/15 2051 06/09/15 0446 06/09/15 0845  BP: 144/72 101/33 154/85 134/69  Pulse: 56 79 85 85  Temp: 98.6 F (37 C) 97.8 F (36.6 C) 97.9 F (36.6 C) 98 F (36.7 C)  TempSrc: Oral   Oral  Resp: 18 20  20 20  Height:      Weight:  128 kg (282 lb 3 oz)    SpO2: 95% 99% 94% 98%    Intake/Output Summary (Last 24 hours) at 06/09/15 1227 Last data filed at 06/09/15 1044  Gross per 24 hour  Intake   1080 ml  Output    500 ml  Net    580 ml    Exam:  General: No acute respiratory distress Lungs: Clear to auscultation bilaterally without wheezes or crackles Cardiovascular: Regular rate and rhythm without murmur gallop or rub  normal S1 and S2 Abdomen: Nontender, nondistended, soft, bowel sounds positive, no rebound, no ascites, no appreciable mass Extremities: Bilateral lower extremity wounds covered with wraps     Data Review   Micro Results Recent Results (from the past 240 hour(s))  MRSA PCR Screening     Status: None   Collection Time: 06/07/15 10:57 PM  Result Value Ref Range Status   MRSA by PCR NEGATIVE NEGATIVE Final    Comment:        The GeneXpert MRSA Assay (FDA approved for NASAL specimens only), is one component of a comprehensive MRSA colonization surveillance program. It is not intended to diagnose MRSA infection nor to guide or monitor treatment for MRSA infections.     Radiology Reports Dg Chest 2 View  06/07/2015  CLINICAL DATA:  Nausea, vomiting, diarrhea. Shortness of breath. Dry cough. EXAM: CHEST  2 VIEW COMPARISON:  03/30/2014 FINDINGS: The heart size and mediastinal contours are within normal limits. Both lungs are clear. The visualized skeletal structures are unremarkable. IMPRESSION: No active cardiopulmonary disease. Electronically Signed   By: Kathreen Devoid   On: 06/07/2015 15:00     CBC  Recent Labs Lab 06/07/15 1356 06/07/15 2009 06/09/15 0544  WBC 4.8 4.4 3.6*  HGB 9.9* 9.4* 9.2*  HCT 33.3* 30.5* 31.4*  PLT 248 228 245  MCV 76.6* 74.6* 77.7*  MCH 22.8* 23.0* 22.8*  MCHC 29.7* 30.8 29.3*  RDW 16.5* 16.3* 16.6*  LYMPHSABS 0.7  --   --   MONOABS 0.3  --   --   EOSABS 0.1  --   --   BASOSABS 0.0  --   --     Chemistries   Recent Labs Lab 06/07/15 1311 06/07/15 1356 06/07/15 2009 06/08/15 1605 06/09/15 0544  NA  --  131*  --  138 138  K  --  4.4  --  5.2* 5.3*  CL  --  96*  --  105 106  CO2  --  22  --  24 26  GLUCOSE >444* 657*  --  358* 358*  BUN  --  39*  --  35* 36*  CREATININE  --  1.91* 1.57* 1.77* 1.80*  CALCIUM  --  8.9  --  8.7* 8.7*  AST  --  17  --  17  --   ALT  --  15  --  14  --   ALKPHOS  --  108  --  94  --   BILITOT  --   0.7  --  0.2*  --    ------------------------------------------------------------------------------------------------------------------ estimated creatinine clearance is 42.1 mL/min (by C-G formula based on Cr of 1.8). ------------------------------------------------------------------------------------------------------------------ No results for input(s): HGBA1C in the last 72 hours. ------------------------------------------------------------------------------------------------------------------ No results for input(s): CHOL, HDL, LDLCALC, TRIG, CHOLHDL, LDLDIRECT in the last 72 hours. ------------------------------------------------------------------------------------------------------------------  Recent Labs  06/08/15 1605  TSH 1.228   ------------------------------------------------------------------------------------------------------------------ No results for input(s): VITAMINB12, FOLATE,  FERRITIN, TIBC, IRON, RETICCTPCT in the last 72 hours.  Coagulation profile No results for input(s): INR, PROTIME in the last 168 hours.  No results for input(s): DDIMER in the last 72 hours.  Cardiac Enzymes No results for input(s): CKMB, TROPONINI, MYOGLOBIN in the last 168 hours.  Invalid input(s): CK ------------------------------------------------------------------------------------------------------------------ Invalid input(s): POCBNP   CBG:  Recent Labs Lab 06/08/15 1451 06/08/15 1643 06/08/15 2047 06/09/15 0750 06/09/15 1149  GLUCAP 361* 323* 279* 329* 371*       Studies: Dg Chest 2 View  06/07/2015  CLINICAL DATA:  Nausea, vomiting, diarrhea. Shortness of breath. Dry cough. EXAM: CHEST  2 VIEW COMPARISON:  03/30/2014 FINDINGS: The heart size and mediastinal contours are within normal limits. Both lungs are clear. The visualized skeletal structures are unremarkable. IMPRESSION: No active cardiopulmonary disease. Electronically Signed   By: Kathreen Devoid   On:  06/07/2015 15:00      Lab Results  Component Value Date   HGBA1C 11.1* 03/31/2014   HGBA1C 11.1* 09/20/2013   HGBA1C 14.4* 12/25/2012   Lab Results  Component Value Date   CREATININE 1.80* 06/09/2015       Scheduled Meds: . aspirin EC  81 mg Oral Daily  . atorvastatin  80 mg Oral q1800  . carvedilol  12.5 mg Oral BID WC  . cholecalciferol  2,000 Units Oral Daily  . clopidogrel  75 mg Oral Q breakfast  . ferrous Q000111Q C-folic acid  1 capsule Oral Q breakfast  . heparin  5,000 Units Subcutaneous 3 times per day  . insulin aspart  0-15 Units Subcutaneous QID  . insulin NPH Human  45 Units Subcutaneous BID AC & HS  . isosorbide dinitrate  10 mg Oral BID  . levothyroxine  50 mcg Oral QAC breakfast  . multivitamin with minerals  1 tablet Oral Daily  . omega-3 acid ethyl esters  1 g Oral Daily  . PARoxetine  37.5 mg Oral Daily  . pregabalin  150 mg Oral TID   Continuous Infusions:    Active Problems:   Hyperlipemia   Essential hypertension   CAD (coronary artery disease)   Gastroenteritis   Acute on chronic renal failure (HCC)   Diabetes mellitus type II, uncontrolled (HCC)   Chronic diastolic CHF (congestive heart failure) ([place])    Time spent: 45 minutes   [facility] Pager [phone]. If 7PM-7AM, please contact night-coverage at [email or web address], password [facility] 06/09/2015, 12:27 PM  LOS: 2 days

## 2015-06-10 DIAGNOSIS — K529 Noninfective gastroenteritis and colitis, unspecified: Principal | ICD-10-CM

## 2015-06-10 DIAGNOSIS — I5042 Chronic combined systolic (congestive) and diastolic (congestive) heart failure: Secondary | ICD-10-CM | POA: Diagnosis not present

## 2015-06-10 DIAGNOSIS — N179 Acute kidney failure, unspecified: Secondary | ICD-10-CM

## 2015-06-10 DIAGNOSIS — I10 Essential (primary) hypertension: Secondary | ICD-10-CM | POA: Diagnosis not present

## 2015-06-10 DIAGNOSIS — N189 Chronic kidney disease, unspecified: Secondary | ICD-10-CM

## 2015-06-10 LAB — BASIC METABOLIC PANEL
Anion gap: 7 (ref 5–15)
BUN: 31 mg/dL — ABNORMAL HIGH (ref 6–20)
CO2: 25 mmol/L (ref 22–32)
Calcium: 9 mg/dL (ref 8.9–10.3)
Chloride: 107 mmol/L (ref 101–111)
Creatinine, Ser: 1.21 mg/dL — ABNORMAL HIGH (ref 0.44–1.00)
GFR calc Af Amer: 53 mL/min — ABNORMAL LOW (ref >60)
GFR calc non Af Amer: 46 mL/min — ABNORMAL LOW (ref >60)
Glucose, Bld: 97 mg/dL (ref 65–99)
Potassium: 4.7 mmol/L (ref 3.5–5.1)
Sodium: 139 mmol/L (ref 135–145)

## 2015-06-10 LAB — GLUCOSE, CAPILLARY
Glucose-Capillary: 98 mg/dL (ref 65–99)
Glucose-Capillary: 235 mg/dL — ABNORMAL HIGH (ref 65–99)

## 2015-06-10 NOTE — Progress Notes (Signed)
Patient is a high fall risk. Patient refused to have bed alarm on. RN educated patient about risks and patient verbalized understanding. RN will continue to monitor patient.   Ermalinda Memos, RN

## 2015-06-10 NOTE — Care Management (Signed)
CM noted patient being discharged home today in stable condition, she  lives at home with spouse, and denies any difficulty getting monthly medications. Verbalized understanding of contacting PCP ASAP to arrange f/u appointment within 3-4 days, teach back done. No further discharge needs identified.  Will be transported home accompanied by spouse via private vehicle.

## 2015-06-10 NOTE — Discharge Summary (Signed)
Physician Discharge Summary  Christin Hedding [id number] DOB: [birth year] DOA: 06/07/2015  PCP: [name], PA-C  Admit date: 06/07/2015 Discharge date: 06/10/2015  Time spent: 50 minutes  Recommendations for Outpatient Follow-up:  1. Basic metabolic panel to be performed by PCP in 3-4 days  Discharge Condition: Stable Diet recommendation: Carb modified heart healthy low-sodium  Discharge Diagnoses:  Principal Problem:   Gastroenteritis Active Problems:   Hyperlipemia   Essential hypertension   CAD (coronary artery disease)   Acute on chronic renal failure (HCC)   Diabetes mellitus type II, uncontrolled (HCC)   Chronic diastolic CHF (congestive heart failure) (HCC)   History of present illness:  65 year old female with history of diabetes mellitus on insulin, coronary artery disease, hypothyroidism, chronic diastolic heart failure and hyperlipidemia who presents to hospital with nausea vomiting and diarrhea for 2-3 days. She was also noted to be severely hyperglycemic with acute renal failure in the ER.  Hospital Course:  Gastroenteritis -Cheryll Cockayne is tolerating solid food without any difficulty at this time  Uncontrolled hyperglycemia and type 2 diabetes mellitus -Exacerbation likely secondary to above-mentioned GI issues -Sugars significantly improved now -She can resume her home doses of insulin  Acute renal failure -Prerenal-resolved with IV fluids and holding diuretics -Creatinine today 1.21  Essential hypertension -Continue home medications-resume losartan tomorrow morning as renal function has returned to baseline  Chronic diastolic heart failure -Has been compensated-diuretics have been on hold due to dehydration but can be resumed tomorrow morning  Bilateral pedal edema with ulcers -Dressings done by wound care in the hospital-she can continue to follow-up with outpatient wound care to have Unna boots resumed if needed  Discharge Exam: Filed Weights   06/07/15 1346 06/07/15 1839 06/08/15 2051  Weight: 132.904 kg (293 lb) 126.4 kg (278 lb 10.6 oz) 128 kg (282 lb 3 oz)   Filed Vitals:   06/10/15 0538 06/10/15 0857  BP: 146/63 158/57  Pulse: 79 85  Temp: 97.8 F (36.6 C) 97.5 F (36.4 C)  Resp: 23 21    General: AAO x 3, no distress Cardiovascular: RRR, no murmurs  Respiratory: clear to auscultation bilaterally GI: soft, non-tender, non-distended, bowel sound positive  Discharge Instructions You were cared for by a hospitalist during your hospital stay. If you have any questions about your discharge medications or the care you received while you were in the hospital after you are discharged, you can call the unit and asked to speak with the hospitalist on call if the hospitalist that took care of you is not available. Once you are discharged, your primary care physician will handle any further medical issues. Please note that NO REFILLS for any discharge medications will be authorized once you are discharged, as it is imperative that you return to your primary care physician (or establish a relationship with a primary care physician if you do not have one) for your aftercare needs so that they can reassess your need for medications and monitor your lab values.     Medication List    TAKE these medications        albuterol 108 (90 Base) MCG/ACT inhaler  Commonly known as:  PROVENTIL HFA;VENTOLIN HFA  Inhale 2 puffs into the lungs every 6 (six) hours as needed for wheezing or shortness of breath.     aspirin EC 81 MG tablet  Take 81 mg by mouth daily.     atorvastatin 80 MG tablet  Commonly known as:  LIPITOR  TAKE 1 TABLET DAILY AT 6PM (MUST KEEP  APPT WITH DR.KATZ)     carvedilol 12.5 MG tablet  Commonly known as:  COREG  TAKE 1 TABLET (12.5 MG TOTAL) BY MOUTH 2 (TWO) TIMES DAILY WITH A MEAL.     clonazePAM 1 MG tablet  Commonly known as:  KLONOPIN  Take 1 mg by mouth 3 (three) times daily as needed for anxiety.      clopidogrel 75 MG tablet  Commonly known as:  PLAVIX  Take 1 tablet (75 mg total) by mouth daily with breakfast.     FERRALET 90 90-1 MG Tabs  Take 1 tablet by mouth daily.     fish oil-omega-3 fatty acids 1000 MG capsule  Take 1 g by mouth daily.     hydrocortisone 25 MG suppository  Commonly known as:  ANUSOL-HC  Place 1 suppository (25 mg total) rectally 2 (two) times daily.     insulin NPH-regular Human (70-30) 100 UNIT/ML injection  Commonly known as:  NOVOLIN 70/30  Inject into the skin 3 (three) times daily with meals. 75 units in the morning, and 50 with lunch and 12 in the evening     isosorbide dinitrate 10 MG tablet  Commonly known as:  ISORDIL  TAKE 1 TABLET (10 MG TOTAL) BY MOUTH 2 (TWO) TIMES DAILY.     levothyroxine 50 MCG tablet  Commonly known as:  SYNTHROID, LEVOTHROID  Take 50 mcg by mouth daily before breakfast.     losartan 50 MG tablet  Commonly known as:  COZAAR  TAKE 1 TABLET BY MOUTH DAILY     metolazone 2.5 MG tablet  Commonly known as:  ZAROXOLYN  Take one every Wednesday and Sunday     multivitamin with minerals Tabs tablet  Take 1 tablet by mouth daily.     nitroGLYCERIN 0.4 MG SL tablet  Commonly known as:  NITROSTAT  Place 1 tablet (0.4 mg total) under the tongue every 5 (five) minutes as needed for chest pain.     ondansetron 4 MG disintegrating tablet  Commonly known as:  ZOFRAN ODT  Take 1 tablet (4 mg total) by mouth every 8 (eight) hours as needed for nausea or vomiting.     ONE TOUCH ULTRA TEST test strip  Generic drug:  glucose blood  TEST 3 TIMES A DAY AND AS NEEDED     PARoxetine 37.5 MG 24 hr tablet  Commonly known as:  PAXIL-CR  TAKE 1 TABLET (37.5 MG TOTAL) BY MOUTH DAILY. TAKEN WITH 25MG  FOR A TOTAL = 62.5 MG     PARoxetine 25 MG 24 hr tablet  Commonly known as:  PAXIL-CR  TAKE 1 TABLET (25 MG TOTAL) BY MOUTH DAILY. TAKEN WITH 37.5 FOR A TOTAL = 62.5MG      potassium chloride SA 20 MEQ tablet  Commonly known as:   KLOR-CON M20  Take 2 tablets (40 mEq total) by mouth 2 (two) times daily.     pregabalin 150 MG capsule  Commonly known as:  LYRICA  Take 150 mg by mouth 3 (three) times daily.     promethazine 25 MG tablet  Commonly known as:  PHENERGAN  Take 1 tablet (25 mg total) by mouth every 6 (six) hours as needed for nausea or vomiting.     torsemide 20 MG tablet  Commonly known as:  DEMADEX  Take 40-80 mg by mouth 2 (two) times daily. Take 4 tablets (80 mg) in the morning and 2 tablets (40 mg) in the afternoon     TRADJENTA 5 MG Tabs  tablet  Generic drug:  linagliptin  Take 5 mg by mouth daily.     Vitamin D 2000 units tablet  Take 2,000 Units by mouth daily.       Allergies  Allergen Reactions  . Codeine Nausea And Vomiting      The results of significant diagnostics from this hospitalization (including imaging, microbiology, ancillary and laboratory) are listed below for reference.    Significant Diagnostic Studies: Dg Chest 2 View  06/07/2015  CLINICAL DATA:  Nausea, vomiting, diarrhea. Shortness of breath. Dry cough. EXAM: CHEST  2 VIEW COMPARISON:  03/30/2014 FINDINGS: The heart size and mediastinal contours are within normal limits. Both lungs are clear. The visualized skeletal structures are unremarkable. IMPRESSION: No active cardiopulmonary disease. Electronically Signed   By: Kathreen Devoid   On: 06/07/2015 15:00    Microbiology: Recent Results (from the past 240 hour(s))  MRSA PCR Screening     Status: None   Collection Time: 06/07/15 10:57 PM  Result Value Ref Range Status   MRSA by PCR NEGATIVE NEGATIVE Final    Comment:        The GeneXpert MRSA Assay (FDA approved for NASAL specimens only), is one component of a comprehensive MRSA colonization surveillance program. It is not intended to diagnose MRSA infection nor to guide or monitor treatment for MRSA infections.      Labs: Basic Metabolic Panel:  Recent Labs Lab 06/07/15 1311 06/07/15 1356  06/07/15 2009 06/08/15 1605 06/09/15 0544 06/10/15 0830  NA  --  131*  --  138 138 139  K  --  4.4  --  5.2* 5.3* 4.7  CL  --  96*  --  105 106 107  CO2  --  22  --  24 26 25   GLUCOSE >444* 657*  --  358* 358* 97  BUN  --  39*  --  35* 36* 31*  CREATININE  --  1.91* 1.57* 1.77* 1.80* 1.21*  CALCIUM  --  8.9  --  8.7* 8.7* 9.0   Liver Function Tests:  Recent Labs Lab 06/07/15 1356 06/08/15 1605  AST 17 17  ALT 15 14  ALKPHOS 108 94  BILITOT 0.7 0.2*  PROT 6.5 6.1*  ALBUMIN 3.0* 2.8*   No results for input(s): LIPASE, AMYLASE in the last 168 hours. No results for input(s): AMMONIA in the last 168 hours. CBC:  Recent Labs Lab 06/07/15 1356 06/07/15 2009 06/09/15 0544  WBC 4.8 4.4 3.6*  NEUTROABS 3.8  --   --   HGB 9.9* 9.4* 9.2*  HCT 33.3* 30.5* 31.4*  MCV 76.6* 74.6* 77.7*  PLT 248 228 245   Cardiac Enzymes: No results for input(s): CKTOTAL, CKMB, CKMBINDEX, TROPONINI in the last 168 hours. BNP: BNP (last 3 results)  Recent Labs  05/01/15 1515 06/07/15 1356  BNP 110.6* 93.4    ProBNP (last 3 results) No results for input(s): PROBNP in the last 8760 hours.  CBG:  Recent Labs Lab 06/09/15 0750 06/09/15 1149 06/09/15 1654 06/09/15 2116 06/10/15 0747  GLUCAP 329* 371* 190* 94 98       Signed:  Debbe Odea, MD [facility] 06/10/2015, 10:55 AM

## 2015-06-10 NOTE — Discharge Instructions (Signed)
See your primary doctor in 3-4 days and have your doctor obtained the following blood work: Basic metabolic panel

## 2015-06-11 LAB — HEMOGLOBIN A1C
Hgb A1c MFr Bld: 9.9 % — ABNORMAL HIGH (ref 4.8–5.6)
Mean Plasma Glucose: 237 mg/dL

## 2015-06-12 ENCOUNTER — Other Ambulatory Visit: Payer: Self-pay

## 2015-06-12 ENCOUNTER — Emergency Department (HOSPITAL_COMMUNITY): Payer: PPO

## 2015-06-12 ENCOUNTER — Ambulatory Visit (HOSPITAL_COMMUNITY)
Admission: RE | Admit: 2015-06-12 | Discharge: 2015-06-12 | Disposition: A | Discharge status: Home / Self Care | Payer: PPO | Source: Ambulatory Visit | Attending: Internal Medicine | Admitting: Internal Medicine

## 2015-06-12 ENCOUNTER — Emergency Department (HOSPITAL_COMMUNITY)
Admission: EM | Admit: 2015-06-12 | Discharge: 2015-06-12 | Disposition: A | Discharge status: Home / Self Care | Payer: PPO | Attending: Emergency Medicine | Admitting: Emergency Medicine

## 2015-06-12 ENCOUNTER — Telehealth: Payer: Self-pay | Admitting: Cardiology

## 2015-06-12 ENCOUNTER — Telehealth (HOSPITAL_COMMUNITY): Payer: Self-pay | Admitting: Cardiology

## 2015-06-12 ENCOUNTER — Encounter (HOSPITAL_COMMUNITY): Payer: Self-pay

## 2015-06-12 VITALS — BP 138/56 | HR 103 | Wt 268.8 lb

## 2015-06-12 DIAGNOSIS — F419 Anxiety disorder, unspecified: Secondary | ICD-10-CM | POA: Diagnosis not present

## 2015-06-12 DIAGNOSIS — Z7902 Long term (current) use of antithrombotics/antiplatelets: Secondary | ICD-10-CM | POA: Diagnosis not present

## 2015-06-12 DIAGNOSIS — E039 Hypothyroidism, unspecified: Secondary | ICD-10-CM | POA: Insufficient documentation

## 2015-06-12 DIAGNOSIS — I110 Hypertensive heart disease with heart failure: Secondary | ICD-10-CM | POA: Diagnosis not present

## 2015-06-12 DIAGNOSIS — I2510 Atherosclerotic heart disease of native coronary artery without angina pectoris: Secondary | ICD-10-CM | POA: Diagnosis not present

## 2015-06-12 DIAGNOSIS — Z794 Long term (current) use of insulin: Secondary | ICD-10-CM | POA: Diagnosis not present

## 2015-06-12 DIAGNOSIS — Z87891 Personal history of nicotine dependence: Secondary | ICD-10-CM | POA: Insufficient documentation

## 2015-06-12 DIAGNOSIS — Z9861 Coronary angioplasty status: Secondary | ICD-10-CM | POA: Insufficient documentation

## 2015-06-12 DIAGNOSIS — D649 Anemia, unspecified: Secondary | ICD-10-CM | POA: Diagnosis not present

## 2015-06-12 DIAGNOSIS — E669 Obesity, unspecified: Secondary | ICD-10-CM | POA: Insufficient documentation

## 2015-06-12 DIAGNOSIS — I5032 Chronic diastolic (congestive) heart failure: Secondary | ICD-10-CM

## 2015-06-12 DIAGNOSIS — I509 Heart failure, unspecified: Secondary | ICD-10-CM | POA: Insufficient documentation

## 2015-06-12 DIAGNOSIS — R0600 Dyspnea, unspecified: Secondary | ICD-10-CM | POA: Diagnosis not present

## 2015-06-12 DIAGNOSIS — E875 Hyperkalemia: Secondary | ICD-10-CM | POA: Insufficient documentation

## 2015-06-12 DIAGNOSIS — I5022 Chronic systolic (congestive) heart failure: Secondary | ICD-10-CM | POA: Insufficient documentation

## 2015-06-12 DIAGNOSIS — M1990 Unspecified osteoarthritis, unspecified site: Secondary | ICD-10-CM | POA: Insufficient documentation

## 2015-06-12 DIAGNOSIS — I25119 Atherosclerotic heart disease of native coronary artery with unspecified angina pectoris: Secondary | ICD-10-CM | POA: Insufficient documentation

## 2015-06-12 DIAGNOSIS — E119 Type 2 diabetes mellitus without complications: Secondary | ICD-10-CM | POA: Diagnosis not present

## 2015-06-12 DIAGNOSIS — G629 Polyneuropathy, unspecified: Secondary | ICD-10-CM | POA: Insufficient documentation

## 2015-06-12 DIAGNOSIS — I10 Essential (primary) hypertension: Secondary | ICD-10-CM | POA: Diagnosis not present

## 2015-06-12 DIAGNOSIS — R600 Localized edema: Secondary | ICD-10-CM | POA: Insufficient documentation

## 2015-06-12 DIAGNOSIS — Z7984 Long term (current) use of oral hypoglycemic drugs: Secondary | ICD-10-CM | POA: Diagnosis not present

## 2015-06-12 DIAGNOSIS — Z9889 Other specified postprocedural states: Secondary | ICD-10-CM | POA: Insufficient documentation

## 2015-06-12 DIAGNOSIS — F329 Major depressive disorder, single episode, unspecified: Secondary | ICD-10-CM | POA: Diagnosis not present

## 2015-06-12 DIAGNOSIS — Z79899 Other long term (current) drug therapy: Secondary | ICD-10-CM | POA: Diagnosis not present

## 2015-06-12 DIAGNOSIS — R0602 Shortness of breath: Secondary | ICD-10-CM

## 2015-06-12 DIAGNOSIS — E1165 Type 2 diabetes mellitus with hyperglycemia: Secondary | ICD-10-CM | POA: Insufficient documentation

## 2015-06-12 DIAGNOSIS — I252 Old myocardial infarction: Secondary | ICD-10-CM | POA: Diagnosis not present

## 2015-06-12 DIAGNOSIS — Z7982 Long term (current) use of aspirin: Secondary | ICD-10-CM | POA: Insufficient documentation

## 2015-06-12 DIAGNOSIS — Z8673 Personal history of transient ischemic attack (TIA), and cerebral infarction without residual deficits: Secondary | ICD-10-CM | POA: Diagnosis not present

## 2015-06-12 DIAGNOSIS — N289 Disorder of kidney and ureter, unspecified: Secondary | ICD-10-CM | POA: Diagnosis not present

## 2015-06-12 LAB — CBC WITH DIFFERENTIAL/PLATELET
Basophils Absolute: 0 10*3/uL (ref 0.0–0.1)
Basophils Relative: 0 %
Eosinophils Absolute: 0.2 10*3/uL (ref 0.0–0.7)
Eosinophils Relative: 3 %
HCT: 34.9 % — ABNORMAL LOW (ref 36.0–46.0)
Hemoglobin: 10.1 g/dL — ABNORMAL LOW (ref 12.0–15.0)
Lymphocytes Relative: 16 %
Lymphs Abs: 1 10*3/uL (ref 0.7–4.0)
MCH: 22.1 pg — ABNORMAL LOW (ref 26.0–34.0)
MCHC: 28.9 g/dL — ABNORMAL LOW (ref 30.0–36.0)
MCV: 76.5 fL — ABNORMAL LOW (ref 78.0–100.0)
Monocytes Absolute: 0.5 10*3/uL (ref 0.1–1.0)
Monocytes Relative: 7 %
Neutro Abs: 4.6 10*3/uL (ref 1.7–7.7)
Neutrophils Relative %: 74 %
Platelets: 317 10*3/uL (ref 150–400)
RBC: 4.56 MIL/uL (ref 3.87–5.11)
RDW: 17.3 % — ABNORMAL HIGH (ref 11.5–15.5)
WBC: 6.3 10*3/uL (ref 4.0–10.5)

## 2015-06-12 LAB — COMPREHENSIVE METABOLIC PANEL
ALT: 20 U/L (ref 14–54)
AST: 30 U/L (ref 15–41)
Albumin: 3.3 g/dL — ABNORMAL LOW (ref 3.5–5.0)
Alkaline Phosphatase: 121 U/L (ref 38–126)
Anion gap: 13 (ref 5–15)
BUN: 38 mg/dL — ABNORMAL HIGH (ref 6–20)
CO2: 25 mmol/L (ref 22–32)
Calcium: 9.5 mg/dL (ref 8.9–10.3)
Chloride: 102 mmol/L (ref 101–111)
Creatinine, Ser: 2.01 mg/dL — ABNORMAL HIGH (ref 0.44–1.00)
GFR calc Af Amer: 29 mL/min — ABNORMAL LOW (ref >60)
GFR calc non Af Amer: 25 mL/min — ABNORMAL LOW (ref >60)
Glucose, Bld: 58 mg/dL — ABNORMAL LOW (ref 65–99)
Potassium: 5.5 mmol/L — ABNORMAL HIGH (ref 3.5–5.1)
Sodium: 140 mmol/L (ref 135–145)
Total Bilirubin: 0.3 mg/dL (ref 0.3–1.2)
Total Protein: 6.9 g/dL (ref 6.5–8.1)

## 2015-06-12 LAB — BASIC METABOLIC PANEL
Anion gap: 13 (ref 5–15)
BUN: 37 mg/dL — ABNORMAL HIGH (ref 6–20)
CO2: 22 mmol/L (ref 22–32)
Calcium: 9.2 mg/dL (ref 8.9–10.3)
Chloride: 103 mmol/L (ref 101–111)
Creatinine, Ser: 1.78 mg/dL — ABNORMAL HIGH (ref 0.44–1.00)
GFR calc Af Amer: 33 mL/min — ABNORMAL LOW (ref >60)
GFR calc non Af Amer: 29 mL/min — ABNORMAL LOW (ref >60)
Glucose, Bld: 309 mg/dL — ABNORMAL HIGH (ref 65–99)
Potassium: 6.3 mmol/L (ref 3.5–5.1)
Sodium: 138 mmol/L (ref 135–145)

## 2015-06-12 LAB — PROTIME-INR
INR: 1.02 (ref 0.00–1.49)
Prothrombin Time: 13.6 seconds (ref 11.6–15.2)

## 2015-06-12 LAB — I-STAT TROPONIN, ED: Troponin i, poc: 0.02 ng/mL (ref 0.00–0.08)

## 2015-06-12 LAB — CBG MONITORING, ED: Glucose-Capillary: 133 mg/dL — ABNORMAL HIGH (ref 65–99)

## 2015-06-12 MED ORDER — ACETAMINOPHEN 325 MG PO TABS
650.0000 mg | ORAL_TABLET | Freq: Once | ORAL | Status: AC
  Administered 2015-06-12: 650 mg via ORAL
  Filled 2015-06-12: qty 2

## 2015-06-12 NOTE — Progress Notes (Signed)
Advanced Heart Failure Medication Review by a Pharmacist  Does the patient  feel that his/her medications are working for him/her?  yes  Has the patient been experiencing any side effects to the medications prescribed?  no  Does the patient measure his/her own blood pressure or blood glucose at home?  yes   Does the patient have any problems obtaining medications due to transportation or finances?   no  Understanding of regimen: good Understanding of indications: good Potential of compliance: good Patient understands to avoid NSAIDs. Patient understands to avoid decongestants.  Issues to address at subsequent visits: None   Pharmacist comments:  Susan [name] is a pleasant 65 yo F presenting with her medication bottles. She reports good compliance with her medications but does state that she still does not feel well since her hospital discharge. We also reviewed the indications for her medications and the importance of consistent use. She also stated that she is not taking Lipitor and doesn't know why it's on her list. With her history of CAD, she should be on a statin. No other medication-related questions or concerns at this time.   Susan [name]. Velva Harman, PharmD, BCPS, CPP Clinical Pharmacist Pager: [phone] Phone: [phone] 06/12/2015 2:04 PM      Time with patient: 12 minutes Preparation and documentation time: 2 minutes Total time: 14 minutes

## 2015-06-12 NOTE — ED Provider Notes (Addendum)
CSN: [id number]     Arrival date & time 06/12/15  1720 History   First MD Initiated Contact with Patient 06/12/15 2118     Chief Complaint  Patient presents with  . Shortness of Breath  . Abnormal Lab     (Consider location/radiation/quality/duration/timing/severity/associated sxs/prior Treatment) HPI Comments: Patient with a history of chronic diastolic heart failure, hyperlipidemia, coronary artery disease status post stent placement and diabetes presents with an abnormal lab. She was seen today at the CHF clinic as routine appointment. She states overall she doesn't feel very well but this is essentially baseline for her. She has shortness of breath but it's not any worse than her baseline shortness of breath. She states her pedal edema is at baseline. She denies any recent weight gain. While she was at the CHF clinic, they ordered labs including a BMET.  Her potassium is noted to be 6.3. They called her home and told her to be seen by at the emergency department.  Patient is a 65 y.o. female presenting with shortness of breath.  Shortness of Breath Associated symptoms: no abdominal pain, no chest pain, no cough, no diaphoresis, no fever, no headaches, no rash and no vomiting     Past Medical History  Diagnosis Date  . Tachycardia     Sinus tachycardia  . Dyslipidemia   . Hypothyroidism   . CAD (coronary artery disease)     MI in 2000 - MI  2007 - treated bare metal stent (no nuclear since then as 9/11)  . HTN (hypertension)   . Chronic diastolic heart failure (HCC)     a) ECHO (08/2013) EF 55-60% and RV function nl b) RHC (08/2013) RA 4, RV 30/5/7, PA 25/10 (16), PCWP 7, Fick CO/CI 6.3/2.7, PVR 1.5 WU, PA 61 and 66%  . RBBB (right bundle branch block)     Old  . Urinary incontinence   . Obesity   . Syncope     likely due to low blood sugar  . Carotid artery disease ([name])   . Acute MI ([place]) 1999; 2007  . CHF (congestive heart failure) ([place])   . Anxiety   . Depression   . Type  II diabetes mellitus ([place])   . Peripheral neuropathy ([place])   . Venous insufficiency   . Stroke [facility])     mini strokes  . PONV (postoperative nausea and vomiting)   . Renal insufficiency     DENIES  . Anemia     hx  . Anginal pain ([place])   . Exertional shortness of breath   . Daily headache     "~ every other day; since I fell in June" (03/15/2014)  . Osteoarthritis   . Arthritis     "generalized" (03/15/2014)   Past Surgical History  Procedure Laterality Date  . Knee arthroscopy Left 10/25/2006  . Coronary angioplasty with stent placement  1999; 2007    "1 + 1"  . Abdominal hysterectomy  1980's  . Tubal ligation  1970's  . Cataract extraction, bilateral Bilateral ?2013  . Shoulder arthroscopy w/ rotator cuff repair Right 03/14/2014  . Shoulder arthroscopy with open rotator cuff repair Right 03/14/2014    Procedure: RIGHT SHOULDER ARTHROSCOPY WITH BICEPS RELEASE, OPEN SUBSCAPULA REPAIR, OPEN SUPRASPINATUS REPAIR.;  Surgeon: Meredith Pel, MD;  Location: [facility];  Service: Orthopedics;  Laterality: Right;  . Right heart catheterization N/A 09/22/2013    Procedure: RIGHT HEART CATH;  Surgeon: Jolaine Artist, MD;  Location: [facility] CATH LAB;  Service: Cardiovascular;  Laterality: N/A;   Family History  Problem Relation Age of Onset  . Coronary artery disease    . Heart attack Mother    Social History  Substance Use Topics  . Smoking status: Former Smoker -- 3.00 packs/day for 32 years    Types: Cigarettes    Quit date: 10/24/1997  . Smokeless tobacco: Never Used  . Alcohol Use: Yes     Comment: "might have 2-3 daiquiris in the summer"   OB History    No data available     Review of Systems  Constitutional: Positive for fatigue. Negative for fever, chills and diaphoresis.  HENT: Negative for congestion, rhinorrhea and sneezing.   Eyes: Negative.   Respiratory: Positive for shortness of breath. Negative for cough and chest tightness.   Cardiovascular: Positive for leg  swelling. Negative for chest pain.  Gastrointestinal: Negative for nausea, vomiting, abdominal pain, diarrhea and blood in stool.  Genitourinary: Negative for frequency, hematuria, flank pain and difficulty urinating.  Musculoskeletal: Negative for back pain and arthralgias.  Skin: Negative for rash.  Neurological: Negative for dizziness, speech difficulty, weakness, numbness and headaches.      Allergies  Codeine  Home Medications   Prior to Admission medications   Medication Sig Start Date End Date Taking? Authorizing Provider  albuterol (PROVENTIL HFA;VENTOLIN HFA) 108 (90 BASE) MCG/ACT inhaler Inhale 2 puffs into the lungs every 6 (six) hours as needed for wheezing or shortness of breath.   Yes Historical Provider, MD  aspirin EC 81 MG tablet Take 81 mg by mouth daily.   Yes Historical Provider, MD  carvedilol (COREG) 12.5 MG tablet Take 12.5 mg by mouth 2 (two) times daily with a meal.   Yes Historical Provider, MD  Cholecalciferol (VITAMIN D) 2000 UNITS tablet Take 2,000 Units by mouth daily.   Yes Historical Provider, MD  clonazePAM (KLONOPIN) 1 MG tablet Take 1 mg by mouth 3 (three) times daily as needed for anxiety.   Yes Historical Provider, MD  clopidogrel (PLAVIX) 75 MG tablet Take 1 tablet (75 mg total) by mouth daily with breakfast. 08/21/14  Yes Shaune Pascal Bensimhon, MD  Fe Cbn-Fe Gluc-FA-B12-C-DSS (FERRALET 90) 90-1 MG TABS Take 1 tablet by mouth daily. 05/18/15  Yes Historical Provider, MD  fish oil-omega-3 fatty acids 1000 MG capsule Take 1 g by mouth daily.    Yes Historical Provider, MD  insulin NPH-regular Human (NOVOLIN 70/30) (70-30) 100 UNIT/ML injection Inject 12-75 Units into the skin 3 (three) times daily with meals. 75 units in the morning, and 50 with lunch and 12 in the evening   Yes Historical Provider, MD  isosorbide dinitrate (ISORDIL) 10 MG tablet TAKE 1 TABLET (10 MG TOTAL) BY MOUTH 2 (TWO) TIMES DAILY. 02/27/15  Yes Carlena Bjornstad, MD  levothyroxine  (SYNTHROID, LEVOTHROID) 50 MCG tablet Take 50 mcg by mouth daily before breakfast.   Yes Historical Provider, MD  losartan (COZAAR) 50 MG tablet TAKE 1 TABLET BY MOUTH DAILY 03/26/15  Yes [name], PA-C  metFORMIN (GLUMETZA) 500 MG (MOD) 24 hr tablet Take 500 mg by mouth 2 (two) times daily with a meal.   Yes Historical Provider, MD  metolazone (ZAROXOLYN) 2.5 MG tablet Take one every Wednesday and Sunday Patient taking differently: Take 2.5 mg by mouth 2 (two) times a week. Take one every Wednesday and Sunday 05/08/15  Yes Amy D Clegg, NP  Multiple Vitamin (MULTIVITAMIN WITH MINERALS) TABS tablet Take 1 tablet by mouth daily.  Yes Historical Provider, MD  nitroGLYCERIN (NITROSTAT) 0.4 MG SL tablet Place 1 tablet (0.4 mg total) under the tongue every 5 (five) minutes as needed for chest pain. 05/08/15  Yes Amy D Clegg, NP  PARoxetine (PAXIL-CR) 25 MG 24 hr tablet TAKE 1 TABLET (25 MG TOTAL) BY MOUTH DAILY. TAKEN WITH 37.5 FOR A TOTAL = 62.5MG  05/10/15  Yes [name], PA-C  PARoxetine (PAXIL-CR) 37.5 MG 24 hr tablet TAKE 1 TABLET (37.5 MG TOTAL) BY MOUTH DAILY. TAKEN WITH 25MG  FOR A TOTAL = 62.5 MG 04/10/15  Yes Mary B Dixon, PA-C  potassium chloride SA (KLOR-CON M20) 20 MEQ tablet Take 2 tablets (40 mEq total) by mouth 2 (two) times daily. 05/01/15  Yes Amy D Clegg, NP  pregabalin (LYRICA) 150 MG capsule Take 150 mg by mouth 3 (three) times daily.    Yes Historical Provider, MD  torsemide (DEMADEX) 20 MG tablet Take 40-80 mg by mouth 2 (two) times daily. Take 4 tablets (80 mg) in the morning and 2 tablets (40 mg) in the afternoon   Yes Historical Provider, MD  TRADJENTA 5 MG TABS tablet Take 5 mg by mouth daily. 05/31/15  Yes Historical Provider, MD   BP 120/45 mmHg  Pulse 85  Temp(Src) 97.6 F (36.4 C) (Oral)  Resp 15  SpO2 97% Physical Exam  Constitutional: She is oriented to person, place, and time. She appears well-developed and well-nourished.  Obese  HENT:  Head: Normocephalic and  atraumatic.  Eyes: Pupils are equal, round, and reactive to light.  Neck: Normal range of motion. Neck supple.  Cardiovascular: Normal rate, regular rhythm and normal heart sounds.   Pulmonary/Chest: Effort normal and breath sounds normal. No respiratory distress. She has no wheezes. She has no rales. She exhibits no tenderness.  Abdominal: Soft. Bowel sounds are normal. There is no tenderness. There is no rebound and no guarding.  Musculoskeletal: Normal range of motion. She exhibits edema (2+ pitting edema bilaterally).  Lymphadenopathy:    She has no cervical adenopathy.  Neurological: She is alert and oriented to person, place, and time.  Skin: Skin is warm and dry. No rash noted.  Psychiatric: She has a normal mood and affect.    ED Course  Procedures (including critical care time) Labs Review Labs Reviewed  CBC WITH DIFFERENTIAL/PLATELET - Abnormal; Notable for the following:    Hemoglobin 10.1 (*)    HCT 34.9 (*)    MCV 76.5 (*)    MCH 22.1 (*)    MCHC 28.9 (*)    RDW 17.3 (*)    All other components within normal limits  COMPREHENSIVE METABOLIC PANEL - Abnormal; Notable for the following:    Potassium 5.5 (*)    Glucose, Bld 58 (*)    BUN 38 (*)    Creatinine, Ser 2.01 (*)    Albumin 3.3 (*)    GFR calc non Af Amer 25 (*)    GFR calc Af Amer 29 (*)    All other components within normal limits  CBG MONITORING, ED - Abnormal; Notable for the following:    Glucose-Capillary 133 (*)    All other components within normal limits  PROTIME-INR  I-STAT TROPOININ, ED    Imaging Review Dg Chest 2 View  06/12/2015  CLINICAL DATA:  Pt sent here by "vascular lab" due to high potassium of "6." She arrives with slightly increased respirations and appears SOB. She states this started today. Only pain she reports is HA. EXAM: CHEST  2  VIEW COMPARISON:  06/07/2015 FINDINGS: The heart size and mediastinal contours are within normal limits. Both lungs are clear. No pleural effusion or  pneumothorax. The bony thorax is intact. IMPRESSION: No active cardiopulmonary disease. Electronically Signed   By: Lajean Manes M.D.   On: 06/12/2015 18:02   I have personally reviewed and evaluated these images and lab results as part of my medical decision-making.   EKG Interpretation None      ED ECG REPORT   Date: 06/12/2015  Rate: 87  Rhythm: normal sinus rhythm  QRS Axis: normal  Intervals: normal  ST/T Wave abnormalities: nonspecific ST/T changes  Conduction Disutrbances:right bundle branch block  Narrative Interpretation:   Old EKG Reviewed: unchanged  I have personally reviewed the EKG tracing and agree with the computerized printout as noted.  MDM   Final diagnoses:  Hyperkalemia  Renal insufficiency    Patient presents as a follow-up from the CHF clinic when they had noted an elevated potassium at 6.3. Her potassium is 5.5 in the ED. However her creatinine has become more elevated at 2.0. This is higher than her baseline values. Her shortness of breath and peel edema seems stable and similar to her baseline symptoms. She doesn't have any evidence of CHF on chest x-ray. I discussed the patient with Dr. Radford Pax. She advises to allow the patient to be discharged home. We will hold her potassium. Dr. Radford Pax also advises to stop her diuretics until tomorrow. Dr. Radford Pax has sent a note to the heart failure clinic to arrange close follow-up for the patient.    [name], MD 06/12/15 [id number]  [name], MD 06/12/15 2303

## 2015-06-12 NOTE — ED Notes (Signed)
Verbal order for 634m of tyenol for headache per Dr. Tamera Punt.

## 2015-06-12 NOTE — Telephone Encounter (Signed)
CRITICAL LAB RESULTS K- 6.3 PER VO [name], NP PATIENT SHOULD STOP PO POTASSIUM WILL NEED LABS REPEATED STAT, IF PATIENT IS UNABLE TO RETURN TO CHF CLINIC BEFORE END OF CLOSING TODAY, PATIENT SHOULD RETURN TO NEAREST ER FOR FURTHER EVALUATION   DETAILED MESSAGE LEFT FOR PATIENT WITH ABOVE INSTRUCTIONS

## 2015-06-12 NOTE — Progress Notes (Signed)
Patient ID: Susan [name], female   DOB: 12/17/1950, 65 y.o.   MRN: [id number]  PCP: Dr Edilia Bo  Cardiologist: Dr Myrtis Ser  Endocrinologist: Dr Lurene Shadow  Primary HF Cardiologist: Dr Leory Plowman   HPI: Susan [name] is a 65 year old with a history of RBBB, DM, Hypothyroid, diastolic heart failure, CAD MI 2000/2007 BMS 2007, TIA, obesity.   Admitted 06/07/15 with N,V, hyperglycemia. Creatinine was up to 1.9. On the day discharge creatinine was back down to 1.2 Thought to be dehydrated so diuretics were held. Diuretics restarted on the day discharge. Discharge weight 282 pounds.   She returns for post hospital follow up. Overall feeling poorly. Ongoing SOB with exertion. Not weighing at home. [facility]. Tremendous appetite. Taking all medications. Says she is taking all medications.    RHC 09/22/13: RA = 4  RV = 30/5/7  PA = 25/10 (16)  PCW = 7  Fick cardiac output/index = 6.3/2.7  PVR = 1.5 WU   FA sat = 93%  PA sat = 61%, 66%   Labs: 07/18/13 K 4.8 Creatinine 1.38 Glucose 1005 she refused to go to [facility] Labs : 08/17/13 K 3.9 Creatinine 1.4 Glucose 499 Labs: 09/29/13 K 4.5 Creatinine 1.4  Labs 04/18/14 K 4.4 Creatinine 1.18 Labs 03/26/2015: K 5.3 Creatinine 1.41  Labs 05/01/2015: K 4.9 Creatinine 1.8 Labs 05/04/2015: K 5.0 Creatinine 1.74  Labs 06/10/2015: K 4.7 Creatinine 1.21  SH: Former smoker quit 15 year ago. Does drink alcohol. She is disabled. Lives at home with her husband and disabled son - Husband is truck Hospital doctor. Son has a brain injury after he attempted suicide 2 years ago.    FH: Mom MI  ROS: All systems negative except as listed in HPI, PMH and Problem List.  Past Medical History  Diagnosis Date  . Tachycardia     Sinus tachycardia  . Dyslipidemia   . Hypothyroidism   . CAD (coronary artery disease)     MI in 2000 - MI  2007 - treated bare metal stent (no nuclear since then as 9/11)  . HTN (hypertension)    . Chronic diastolic heart failure (HCC)     a) ECHO (08/2013) EF 55-60% and RV function nl b) RHC (08/2013) RA 4, RV 30/5/7, PA 25/10 (16), PCWP 7, Fick CO/CI 6.3/2.7, PVR 1.5 WU, PA 61 and 66%  . RBBB (right bundle branch block)     Old  . Urinary incontinence   . Obesity   . Syncope     likely due to low blood sugar  . Carotid artery disease (HCC)   . Acute MI (HCC) 1999; 2007  . CHF (congestive heart failure) (HCC)   . Anxiety   . Depression   . Type II diabetes mellitus (HCC)   . Peripheral neuropathy (HCC)   . Venous insufficiency   . Stroke [facility])     mini strokes  . PONV (postoperative nausea and vomiting)   . Renal insufficiency     DENIES  . Anemia     hx  . Anginal pain (HCC)   . Exertional shortness of breath   . Daily headache     "~ every other day; since I fell in June" (03/15/2014)  . Osteoarthritis   . Arthritis     "generalized" (03/15/2014)    Current Outpatient Prescriptions  Medication Sig Dispense Refill  . albuterol (PROVENTIL HFA;VENTOLIN HFA) 108 (90 BASE) MCG/ACT inhaler Inhale 2 puffs into  the lungs every 6 (six) hours as needed for wheezing or shortness of breath.    Marland Kitchen aspirin EC 81 MG tablet Take 81 mg by mouth daily.    Marland Kitchen atorvastatin (LIPITOR) 80 MG tablet TAKE 1 TABLET DAILY AT 6PM (MUST KEEP APPT WITH DR.KATZ) 30 tablet 3  . Cholecalciferol (VITAMIN D) 2000 UNITS tablet Take 2,000 Units by mouth daily.    . clonazePAM (KLONOPIN) 1 MG tablet Take 1 mg by mouth 3 (three) times daily as needed for anxiety.    . clopidogrel (PLAVIX) 75 MG tablet Take 1 tablet (75 mg total) by mouth daily with breakfast. 90 tablet 3  . Fe Cbn-Fe Gluc-FA-B12-C-DSS (FERRALET 90) 90-1 MG TABS Take 1 tablet by mouth daily.  12  . fish oil-omega-3 fatty acids 1000 MG capsule Take 1 g by mouth daily.     . hydrocortisone (ANUSOL-HC) 25 MG suppository Place 1 suppository (25 mg total) rectally 2 (two) times daily. (Patient not taking: Reported on 06/07/2015) 12 suppository  0  . insulin NPH-regular Human (NOVOLIN 70/30) (70-30) 100 UNIT/ML injection Inject into the skin 3 (three) times daily with meals. 75 units in the morning, and 50 with lunch and 12 in the evening    . isosorbide dinitrate (ISORDIL) 10 MG tablet TAKE 1 TABLET (10 MG TOTAL) BY MOUTH 2 (TWO) TIMES DAILY. 60 tablet 3  . levothyroxine (SYNTHROID, LEVOTHROID) 50 MCG tablet Take 50 mcg by mouth daily before breakfast.    . losartan (COZAAR) 50 MG tablet TAKE 1 TABLET BY MOUTH DAILY 90 tablet 1  . metolazone (ZAROXOLYN) 2.5 MG tablet Take one every Wednesday and Sunday (Patient taking differently: Take 2.5 mg by mouth as directed. Take one every Wednesday and Sunday) 30 tablet 6  . Multiple Vitamin (MULTIVITAMIN WITH MINERALS) TABS tablet Take 1 tablet by mouth daily.    . nitroGLYCERIN (NITROSTAT) 0.4 MG SL tablet Place 1 tablet (0.4 mg total) under the tongue every 5 (five) minutes as needed for chest pain. 25 tablet 3  . ondansetron (ZOFRAN ODT) 4 MG disintegrating tablet Take 1 tablet (4 mg total) by mouth every 8 (eight) hours as needed for nausea or vomiting. 20 tablet 1  . ONE TOUCH ULTRA TEST test strip TEST 3 TIMES A DAY AND AS NEEDED  11  . PARoxetine (PAXIL-CR) 25 MG 24 hr tablet TAKE 1 TABLET (25 MG TOTAL) BY MOUTH DAILY. TAKEN WITH 37.5 FOR A TOTAL = 62.5MG  30 tablet 5  . PARoxetine (PAXIL-CR) 37.5 MG 24 hr tablet TAKE 1 TABLET (37.5 MG TOTAL) BY MOUTH DAILY. TAKEN WITH 25MG  FOR A TOTAL = 62.5 MG 30 tablet 5  . potassium chloride SA (KLOR-CON M20) 20 MEQ tablet Take 2 tablets (40 mEq total) by mouth 2 (two) times daily. 120 tablet 6  . pregabalin (LYRICA) 150 MG capsule Take 150 mg by mouth 3 (three) times daily.     . promethazine (PHENERGAN) 25 MG tablet Take 1 tablet (25 mg total) by mouth every 6 (six) hours as needed for nausea or vomiting. 30 tablet 0  . torsemide (DEMADEX) 20 MG tablet Take 40-80 mg by mouth 2 (two) times daily. Take 4 tablets (80 mg) in the morning and 2 tablets (40 mg)  in the afternoon    . TRADJENTA 5 MG TABS tablet Take 5 mg by mouth daily.  6   No current facility-administered medications for this encounter.    Filed Vitals:   06/12/15 1344  BP: 138/56  Pulse: 103  Weight: 268 lb 12.8 oz (121.927 kg)  SpO2: 97%   PHYSICAL EXAM: General:  Chronically ill appearing. Mild dyspnea walking in the clinic  HEENT: normal Neck: supple. JVP difficult to assess d/t body habitus. Mildly elevated.    Carotids 2+ bilaterally; no bruits. No lymphadenopathy or thryomegaly appreciated. Cor: PMI normal. Regular rate & rhythm. No rubs, gallops or murmurs. Lungs: clear Abdomen: obese, soft, nontender, nondistended. No hepatosplenomegaly. No bruits or masses. Good bowel sounds. Extremities: no cyanosis, clubbing, rash, R and LLE 1+ edema LLE with bandage in place.  Neuro: alert & orientedx3, cranial nerves grossly intact. Moves all 4 extremities w/o difficulty. Affect pleasant.    ASSESSMENT & PLAN:   1. Chronic Systolic Heart Failure: NICM, EF 55-60% Grade II DD(05/2015 )  -- NYHA III symptoms. Volume status seem stable today.  Continue torsemide 80 mg in am and 40 mg in pm. Add metolazone 2.5 mg twice a week.  - Continue losartan 50 mg daily. - Check BMET today.   - Reinforced the need and importance of daily weights, a low sodium diet, and fluid restriction (less than 2 L a day). Instructed to call the HF clinic if weight increases more than 3 lbs overnight or 5 lbs in a week 2.. Dyspnea- Ongoing dyspnea with exertion. She has been offered sleep study many times but refuses.   3. Uncontrolled DM:  Recent admit with hyperglycemia. Has follow up with PCP.   4. CAD- no evidence of ischemia- on aspirin and statin.  5. Obesity: Discussed portion control the importance of weight loss.  6. HTN- Stable continue current regimen.   She remains at hight risk for hospital readmits due to poor insight. Follow up in 6 weeks. May need paramedicine again.   Jamieon Lannen  NP-C  1:47 PM

## 2015-06-12 NOTE — Patient Instructions (Signed)
LABS TODAY   Your physician recommends that you schedule a follow-up appointment in: [place]  Do the following things EVERYDAY: 1) Weigh yourself in the morning before breakfast. Write it down and keep it in a log. 2) Take your medicines as prescribed 3) Eat low salt foods-Limit salt (sodium) to 2000 mg per day.  4) Stay as active as you can everyday 5) Limit all fluids for the day to less than 2 liters 6)

## 2015-06-12 NOTE — ED Notes (Signed)
Pt sent here by "vascular lab" due to high potassium of "6." She arrives with slightly increased respirations and appears SOB. She states this started today. Only pain she reports is HA.

## 2015-06-12 NOTE — ED Notes (Signed)
Dr. Belfi at the bedside.  

## 2015-06-12 NOTE — Telephone Encounter (Signed)
Patient went to ER to followup on abnormal lab with K 6.3 from earlier today while in CHF clinic.   K now 5.5 but creatinine 2 (up from 1.2 on discharge 06/10/2015).  Instructed to not take any further potassium or diuretic and call CHF clinic first thing in am to get further instructions on diuretic dosing. Patient has baseline SOB that is stable.

## 2015-06-12 NOTE — ED Notes (Signed)
cbg is 133.

## 2015-06-12 NOTE — Discharge Instructions (Signed)
Do not take your potassium or your diuretics until you contact the heart failure clinic tomorrow for further directions.  Hyperkalemia Hyperkalemia is when you have too much potassium in your blood. Potassium is normally removed (excreted) from your body by your kidneys. If there is too much potassium in your blood, it can affect your heart's ability to function.  CAUSES  Hyperkalemia may be caused by:   Taking in too much potassium. You can do this by:  Using salt substitutes. They contain large amounts of potassium.  Taking potassium supplements.  Eating foods high in potassium.  Excreting too little potassium. This can happen if:  Your kidneys are not working properly. Kidney (renal) disease, including short- or long-term renal failure, is a very common cause of hyperkalemia.  You are taking medicines that lower your excretion of potassium.  You have Addison disease.  You have a urinary tract blockage, such as kidney stones.  You are on treatment to mechanically clean your blood (dialysis) and you skip a treatment.  Releasing a high amount of potassium from your cells into your blood. This can happen with:  Injury to muscles (rhabdomyolysis) or other tissues. Most potassium is stored in your muscles.  Severe burns or infections.  Acidic blood plasma (acidosis). Acidosis can result from many diseases, such as uncontrolled diabetes. RISK FACTORS The most common risk factor of hyperkalemia is kidney disease. Other risk factors of hyperkalemia include:  Addison disease. This is a condition where your glands do not produce enough hormones.  Alcoholism or heavy drug use.   Using certain blood pressure medicines, such as angiotensin-converting enzyme (ACE) inhibitors, angiotensin II receptor blockers (ARBs), or potassium-sparing diuretics such as spironolactone.  Severe injury or burn. SIGNS AND SYMPTOMS  Oftentimes, there are no signs or symptoms of hyperkalemia. However,  when your potassium level becomes high enough, you may experience symptoms such as:  Irregular or very slow heartbeat.  Nausea.  Fatigue.  Tingling of the skin or numbness of the hands or feet.  Muscle weakness.  Fatigue.  Not being able to move (paralysis). You may not have any symptoms of hyperkalemia.  DIAGNOSIS  Hyperkalemia may be diagnosed by:  Physical exam.  Blood tests.  ECG (electrocardiogram).  Discussion of prescription and non-prescription drug use. TREATMENT  Treatment for hyperkalemia is often directed at the underlying cause. In some instances, treatment may include:   Insulin.  Glucose (sugar) and water solution given through a vein (intravenous or IV).  Dialysis.  Medicines to remove the potassium from your body.  Medicines to move calcium from your bloodstream into your tissues. HOME CARE INSTRUCTIONS   Take medicines only as directed by your health care provider.  Do not take any supplements, natural products, herbs, or vitamins without reviewing them with your health care provider. Certain supplements and natural food products can have high amounts of potassium.  Limit your alcohol intake as directed by your health care provider.  Stop illegal drug use. If you need help quitting, ask your health care provider.  Keep all follow-up visits as directed by your health care provider. This is important.  If you have kidney disease, you may need to follow a low potassium diet. A dietitian can help educate you on low potassium foods. SEEK MEDICAL CARE IF:   You notice an irregular or very slow heartbeat.  You feel light-headed.  You feel weak.  You are nauseous.  You have tingling or numbness in your hands or feet. [place]  IF:   You have shortness of breath.  You have chest pain or discomfort.  You pass out.  You have muscle paralysis. MAKE SURE YOU:   Understand these instructions.  Will watch your  condition.  Will get help right away if you are not doing well or get worse.   This information is not intended to replace advice given to you by your health care provider. Make sure you discuss any questions you have with your health care provider.   Document Released: 05/02/2002 Document Revised: 06/02/2014 Document Reviewed: 08/17/2013 Elsevier Interactive Patient Education [employer].

## 2015-06-13 ENCOUNTER — Other Ambulatory Visit (HOSPITAL_COMMUNITY): Payer: Self-pay | Admitting: Adult Health

## 2015-06-13 NOTE — Telephone Encounter (Signed)
Pt did have repeat labs done in the ER 06/12/15 K 5.5 CR 2 Torsemide and potassium on hold per Dr.Turner until reviewed by CHF team  Per vo [name],NP Pt should take Potassium 20 meQ, one tab daily and Torsemide 40 mg, twice a day START DATE 06/15/2015  PATIENT AWARE AND VOICED UNDERSTANDING

## 2015-06-14 ENCOUNTER — Encounter: Payer: Self-pay | Admitting: Physician Assistant

## 2015-06-14 ENCOUNTER — Telehealth: Payer: Self-pay | Admitting: *Deleted

## 2015-06-14 ENCOUNTER — Ambulatory Visit (INDEPENDENT_AMBULATORY_CARE_PROVIDER_SITE_OTHER): Payer: PPO | Admitting: Physician Assistant

## 2015-06-14 VITALS — BP 138/74 | HR 78 | Temp 98.3°F | Resp 18 | Ht 66.0 in | Wt 279.0 lb

## 2015-06-14 DIAGNOSIS — N189 Chronic kidney disease, unspecified: Secondary | ICD-10-CM | POA: Diagnosis not present

## 2015-06-14 DIAGNOSIS — Z09 Encounter for follow-up examination after completed treatment for conditions other than malignant neoplasm: Secondary | ICD-10-CM

## 2015-06-14 DIAGNOSIS — I5032 Chronic diastolic (congestive) heart failure: Secondary | ICD-10-CM

## 2015-06-14 DIAGNOSIS — E1165 Type 2 diabetes mellitus with hyperglycemia: Secondary | ICD-10-CM | POA: Diagnosis not present

## 2015-06-14 DIAGNOSIS — N179 Acute kidney failure, unspecified: Secondary | ICD-10-CM

## 2015-06-14 DIAGNOSIS — I2510 Atherosclerotic heart disease of native coronary artery without angina pectoris: Secondary | ICD-10-CM | POA: Diagnosis not present

## 2015-06-14 DIAGNOSIS — Z794 Long term (current) use of insulin: Secondary | ICD-10-CM | POA: Diagnosis not present

## 2015-06-14 DIAGNOSIS — L03119 Cellulitis of unspecified part of limb: Secondary | ICD-10-CM

## 2015-06-14 DIAGNOSIS — K529 Noninfective gastroenteritis and colitis, unspecified: Secondary | ICD-10-CM | POA: Diagnosis not present

## 2015-06-14 MED ORDER — CEPHALEXIN 500 MG PO CAPS: 500.0000 mg | ORAL_CAPSULE | Freq: Four times a day (QID) | ORAL | Status: DC

## 2015-06-14 NOTE — Progress Notes (Signed)
Patient ID: Susan [name] MRN: [id number], DOB: 11/10/1950, 65 y.o. Date of Encounter: @DATE @  Chief Complaint:  Chief Complaint  Patient presents with  . Hospital F/U    requires F/U BMP  . Congestion    nonproductive cough    HPI: 65 y.o. year old female  presents for follow-up after recent hospitalization.  Came here for office visit with me 06/07/15 at which time she had been having vomiting and diarrhea for multiple days. Finger stick blood sugar was so high that it was unable to be detected on our monitor. She had had no one to drive her to the hospital so EMS was called and transported patient to the emergency room. She was hospitalized 06/07/15-06/10/15 and I have reviewed that discharge summary today. She was noted to be severely hyperglycemic with acute renal failure in the ER.  The following section is copied from the discharge summary: Hospital Course:  Gastroenteritis -Cheryll Cockayne is tolerating solid food without any difficulty at this time  Uncontrolled hyperglycemia and type 2 diabetes mellitus -Exacerbation likely secondary to above-mentioned GI issues -Sugars significantly improved now -She can resume her home doses of insulin  Acute renal failure -Prerenal-resolved with IV fluids and holding diuretics -Creatinine today 1.21  Essential hypertension -Continue home medications-resume losartan tomorrow morning as renal function has returned to baseline  Chronic diastolic heart failure -Has been compensated-diuretics have been on hold due to dehydration but can be resumed tomorrow morning  Bilateral pedal edema with ulcers -Dressings done by wound care in the hospital-she can continue to follow-up with outpatient wound care to have Unna boots resumed if needed"      I reviewed that she does have follow-up appointment scheduled at wound care February 3. I reviewed that she was discharged home on no antibiotics regarding the cellulitis and wounds of her lower  extremities.  Also since that hospitalization she had a routine follow-up appointment at the heart failure clinic 06/12/15. Labs drawn the air revealed potassium 6.3 and creatinine 2. She was called and was informed that if she could not return there in enough time for them to do repeat stat labs and she needed to go to the ER for follow-up and repeat lab. She went to the ER 06/12/15 and potassium there was 5.5. Creatinine 2.0. The ER provider spoke with cardiology and adjustments were made in patient's diuretics and potassium and medications. These adjustments are documented in epic encounters and notes.   Patient with no specific complaints or concerns today. Here for follow-up.   Past Medical History  Diagnosis Date  . Tachycardia     Sinus tachycardia  . Dyslipidemia   . Hypothyroidism   . CAD (coronary artery disease)     MI in 2000 - MI  2007 - treated bare metal stent (no nuclear since then as 9/11)  . HTN (hypertension)   . Chronic diastolic heart failure (HCC)     a) ECHO (08/2013) EF 55-60% and RV function nl b) RHC (08/2013) RA 4, RV 30/5/7, PA 25/10 (16), PCWP 7, Fick CO/CI 6.3/2.7, PVR 1.5 WU, PA 61 and 66%  . RBBB (right bundle branch block)     Old  . Urinary incontinence   . Obesity   . Syncope     likely due to low blood sugar  . Carotid artery disease ([name])   . Acute MI ([place]) 1999; 2007  . CHF (congestive heart failure) ([place])   . Anxiety   . Depression   . Type  II diabetes mellitus ([place])   . Peripheral neuropathy ([place])   . Venous insufficiency   . Stroke [facility])     mini strokes  . PONV (postoperative nausea and vomiting)   . Renal insufficiency     DENIES  . Anemia     hx  . Anginal pain ([place])   . Exertional shortness of breath   . Daily headache     "~ every other day; since I fell in June" (03/15/2014)  . Osteoarthritis   . Arthritis     "generalized" (03/15/2014)     Home Meds: Outpatient Prescriptions Prior to Visit  Medication Sig Dispense  Refill  . albuterol (PROVENTIL HFA;VENTOLIN HFA) 108 (90 BASE) MCG/ACT inhaler Inhale 2 puffs into the lungs every 6 (six) hours as needed for wheezing or shortness of breath.    Marland Kitchen aspirin EC 81 MG tablet Take 81 mg by mouth daily.    . carvedilol (COREG) 12.5 MG tablet Take 12.5 mg by mouth 2 (two) times daily with a meal.    . Cholecalciferol (VITAMIN D) 2000 UNITS tablet Take 2,000 Units by mouth daily.    . clonazePAM (KLONOPIN) 1 MG tablet Take 1 mg by mouth 3 (three) times daily as needed for anxiety.    . clopidogrel (PLAVIX) 75 MG tablet Take 1 tablet (75 mg total) by mouth daily with breakfast. 90 tablet 3  . Fe Cbn-Fe Gluc-FA-B12-C-DSS (FERRALET 90) 90-1 MG TABS Take 1 tablet by mouth daily.  12  . fish oil-omega-3 fatty acids 1000 MG capsule Take 1 g by mouth daily.     . insulin NPH-regular Human (NOVOLIN 70/30) (70-30) 100 UNIT/ML injection Inject 12-75 Units into the skin 3 (three) times daily with meals. 75 units in the morning, and 50 with lunch and 12 in the evening    . isosorbide dinitrate (ISORDIL) 10 MG tablet TAKE 1 TABLET (10 MG TOTAL) BY MOUTH 2 (TWO) TIMES DAILY. 60 tablet 3  . levothyroxine (SYNTHROID, LEVOTHROID) 50 MCG tablet Take 50 mcg by mouth daily before breakfast.    . losartan (COZAAR) 50 MG tablet TAKE 1 TABLET BY MOUTH DAILY 90 tablet 1  . metFORMIN (GLUMETZA) 500 MG (MOD) 24 hr tablet Take 500 mg by mouth 2 (two) times daily with a meal.    . metolazone (ZAROXOLYN) 2.5 MG tablet Take one every Wednesday and Sunday (Patient taking differently: Take 2.5 mg by mouth 2 (two) times a week. Take one every Wednesday and Sunday) 30 tablet 6  . metolazone (ZAROXOLYN) 2.5 MG tablet Take 1 tablet every Wednesday and Sunday. 15 tablet 5  . Multiple Vitamin (MULTIVITAMIN WITH MINERALS) TABS tablet Take 1 tablet by mouth daily.    . nitroGLYCERIN (NITROSTAT) 0.4 MG SL tablet Place 1 tablet (0.4 mg total) under the tongue every 5 (five) minutes as needed for chest pain. 25  tablet 3  . PARoxetine (PAXIL-CR) 25 MG 24 hr tablet TAKE 1 TABLET (25 MG TOTAL) BY MOUTH DAILY. TAKEN WITH 37.5 FOR A TOTAL = 62.5MG  30 tablet 5  . PARoxetine (PAXIL-CR) 37.5 MG 24 hr tablet TAKE 1 TABLET (37.5 MG TOTAL) BY MOUTH DAILY. TAKEN WITH 25MG  FOR A TOTAL = 62.5 MG 30 tablet 5  . potassium chloride SA (KLOR-CON M20) 20 MEQ tablet Take 2 tablets (40 mEq total) by mouth 2 (two) times daily. 120 tablet 6  . pregabalin (LYRICA) 150 MG capsule Take 150 mg by mouth 3 (three) times daily.     Marland Kitchen torsemide (DEMADEX)  20 MG tablet Take 40-80 mg by mouth 2 (two) times daily. Take 4 tablets (80 mg) in the morning and 2 tablets (40 mg) in the afternoon    . TRADJENTA 5 MG TABS tablet Take 5 mg by mouth daily.  6   No facility-administered medications prior to visit.    Allergies:  Allergies  Allergen Reactions  . Codeine Nausea And Vomiting    Social History   Social History  . Marital Status: Married    Spouse Name: N/A  . Number of Children: 3  . Years of Education: 12th   Occupational History  .      unemploye   Social History Main Topics  . Smoking status: Former Smoker -- 3.00 packs/day for 32 years    Types: Cigarettes    Quit date: 10/24/1997  . Smokeless tobacco: Never Used  . Alcohol Use: Yes     Comment: "might have 2-3 daiquiris in the summer"  . Drug Use: No  . Sexual Activity: No   Other Topics Concern  . Not on file   Social History Narrative   Pt lives at home with her spouse.   Caffeine Use- 3 sodas daily    Family History  Problem Relation Age of Onset  . Coronary artery disease    . Heart attack Mother      Review of Systems:  See HPI for pertinent ROS. All other ROS negative.    Physical Exam: Blood pressure 138/74, pulse 78, temperature 98.3 F (36.8 C), temperature source Oral, resp. rate 18, height 5\' 6"  (1.676 m), weight 279 lb (126.554 kg)., Body mass index is 45.05 kg/(m^2). General: Obese white female. Appears in no acute  distress. Neck: Supple. No thyromegaly. No lymphadenopathy. Lungs: Clear bilaterally to auscultation without wheezes, rales, or rhonchi. Breathing is unlabored. Heart: RRR with S1 S2. No murmurs, rubs, or gallops. Musculoskeletal:  Strength and tone normal for age. Extremities/Skin: She does have some lower extremity edema bilaterally. Bilateral anterior shins have diffuse pink erythema. Wounds on the right anterior shin are closing in. Wounds on the left anterior shin are still open. Neuro: Alert and oriented X 3. Moves all extremities spontaneously. Gait is normal. CNII-XII grossly in tact. Psych:  Responds to questions appropriately with a normal affect.     ASSESSMENT AND PLAN:  65 y.o. year old female with  1. Hospital discharge follow-up - BASIC METABOLIC PANEL WITH GFR  2. Uncontrolled type 2 diabetes mellitus with hyperglycemia, with long-term current use of insulin [facility]) She sees endocrinologist Dr. Soyla Murphy for her diabetes management. - BASIC METABOLIC PANEL WITH GFR  3. Acute on chronic renal failure (HCC) During her hospitalization she had acute on chronic renal failure secondary to dehydration. Will recheck labs now. If there is any abnormality on lab at this time will keep the heart failure clinic/cardiology involved in decisions regarding her medications. - BASIC METABOLIC PANEL WITH GFR  4. Gastroenteritis Status post dehydration secondary to recent gastroenteritis. Gastroenteritis resolved. - BASIC METABOLIC PANEL WITH GFR  5. Coronary artery disease involving native coronary artery of native heart without angina pectoris Managed by cardiology  6. Chronic diastolic CHF (congestive heart failure) ([place]) Managed by cardiology and heart failure clinic. We'll recheck labs now. If there is any abnormality on lab at this time regarding potassium or renal function will involve heart failure clinic and cardiology regarding her medications and decision making. - BASIC METABOLIC  PANEL WITH GFR  7. Cellulitis of lower extremity, unspecified laterality She  has follow-up appointment at the [facility] February 3rd. I reviewed her current medication list and she is not on any antibiotic.  I will add Keflex---she is to keep this appointment at wound care. Follow-up if legs worsen in the interim. - cephALEXin (KEFLEX) 500 MG capsule; Take 1 capsule (500 mg total) by mouth 4 (four) times daily.  Dispense: 28 capsule; Refill: 0   Signed, [address] [place], [place], [facility] 06/14/2015 2:31 PM

## 2015-06-14 NOTE — Telephone Encounter (Signed)
Received fax from [facility] Discharge Dispostion stating pt has been discharged from Hospital  Date of admit: 06/07/15  Date of discharge: 06/10/15  Level of Care: Inpatient Acute Medical Care  Discharging facility: [facility]  Attending physician: Waldron Labs, Maree Krabbe MD  PCP: Flonnie Hailstone  Discharge Dispostion: Discharged to [facility], Routine discharge  DX: R73.9- Hyperglycemia, unspecified

## 2015-06-15 ENCOUNTER — Telehealth (HOSPITAL_COMMUNITY): Payer: Self-pay | Admitting: *Deleted

## 2015-06-15 LAB — BASIC METABOLIC PANEL WITH GFR
BUN: 50 mg/dL — ABNORMAL HIGH (ref 7–25)
CO2: 23 mmol/L (ref 20–31)
Calcium: 8.4 mg/dL — ABNORMAL LOW (ref 8.6–10.4)
Chloride: 102 mmol/L (ref 98–110)
Creat: 1.69 mg/dL — ABNORMAL HIGH (ref 0.50–0.99)
GFR, Est African American: 36 mL/min — ABNORMAL LOW (ref >=60)
GFR, Est Non African American: 31 mL/min — ABNORMAL LOW (ref >=60)
Glucose, Bld: 39 mg/dL — CL (ref 70–99)
Potassium: 4.8 mmol/L (ref 3.5–5.3)
Sodium: 137 mmol/L (ref 135–146)

## 2015-06-15 NOTE — Telephone Encounter (Signed)
[facility] practice called today to let us know about Ms Daloia lab results.  Contacted Ms Bi about her lab results.   Spoke with Amy NP about her results.  Per Amy stop K+ and start her Torsemide 40 mg twice a day 06/16/15 Lab work next week BMP will have [employer] draw the labs for [name]  Follow up appointment in two weeks on the 01/31 with Amy NP  Called pt back to give instructions and she read them back to me.  Pt understands and will go next week to have lab draw and come in on the 01/31 to see Amy NP.

## 2015-06-18 ENCOUNTER — Other Ambulatory Visit: Payer: Self-pay | Admitting: Cardiology

## 2015-06-19 ENCOUNTER — Other Ambulatory Visit: Payer: PPO

## 2015-06-19 DIAGNOSIS — R799 Abnormal finding of blood chemistry, unspecified: Secondary | ICD-10-CM

## 2015-06-19 LAB — BASIC METABOLIC PANEL
BUN: 24 mg/dL (ref 7–25)
CO2: 20 mmol/L (ref 20–31)
Calcium: 8.2 mg/dL — ABNORMAL LOW (ref 8.6–10.4)
Chloride: 104 mmol/L (ref 98–110)
Creat: 1.29 mg/dL — ABNORMAL HIGH (ref 0.50–0.99)
Glucose, Bld: 356 mg/dL — ABNORMAL HIGH (ref 70–99)
Potassium: 4.5 mmol/L (ref 3.5–5.3)
Sodium: 136 mmol/L (ref 135–146)

## 2015-06-26 ENCOUNTER — Ambulatory Visit (HOSPITAL_COMMUNITY)
Admission: RE | Admit: 2015-06-26 | Discharge: 2015-06-26 | Disposition: A | Discharge status: Home / Self Care | Payer: PPO | Source: Ambulatory Visit | Attending: Internal Medicine | Admitting: Internal Medicine

## 2015-06-26 VITALS — BP 132/64 | HR 109 | Wt 293.6 lb

## 2015-06-26 DIAGNOSIS — Z79899 Other long term (current) drug therapy: Secondary | ICD-10-CM | POA: Insufficient documentation

## 2015-06-26 DIAGNOSIS — F329 Major depressive disorder, single episode, unspecified: Secondary | ICD-10-CM | POA: Insufficient documentation

## 2015-06-26 DIAGNOSIS — I5032 Chronic diastolic (congestive) heart failure: Secondary | ICD-10-CM

## 2015-06-26 DIAGNOSIS — R0602 Shortness of breath: Secondary | ICD-10-CM

## 2015-06-26 DIAGNOSIS — E785 Hyperlipidemia, unspecified: Secondary | ICD-10-CM | POA: Diagnosis not present

## 2015-06-26 DIAGNOSIS — E1165 Type 2 diabetes mellitus with hyperglycemia: Secondary | ICD-10-CM | POA: Insufficient documentation

## 2015-06-26 DIAGNOSIS — I5022 Chronic systolic (congestive) heart failure: Secondary | ICD-10-CM | POA: Diagnosis not present

## 2015-06-26 DIAGNOSIS — E1142 Type 2 diabetes mellitus with diabetic polyneuropathy: Secondary | ICD-10-CM | POA: Insufficient documentation

## 2015-06-26 DIAGNOSIS — Z9119 Patient's noncompliance with other medical treatment and regimen: Secondary | ICD-10-CM

## 2015-06-26 DIAGNOSIS — Z8249 Family history of ischemic heart disease and other diseases of the circulatory system: Secondary | ICD-10-CM | POA: Diagnosis not present

## 2015-06-26 DIAGNOSIS — Z87891 Personal history of nicotine dependence: Secondary | ICD-10-CM | POA: Insufficient documentation

## 2015-06-26 DIAGNOSIS — I252 Old myocardial infarction: Secondary | ICD-10-CM | POA: Diagnosis not present

## 2015-06-26 DIAGNOSIS — R0600 Dyspnea, unspecified: Secondary | ICD-10-CM | POA: Insufficient documentation

## 2015-06-26 DIAGNOSIS — Z794 Long term (current) use of insulin: Secondary | ICD-10-CM | POA: Diagnosis not present

## 2015-06-26 DIAGNOSIS — Z8673 Personal history of transient ischemic attack (TIA), and cerebral infarction without residual deficits: Secondary | ICD-10-CM | POA: Insufficient documentation

## 2015-06-26 DIAGNOSIS — E1122 Type 2 diabetes mellitus with diabetic chronic kidney disease: Secondary | ICD-10-CM | POA: Diagnosis not present

## 2015-06-26 DIAGNOSIS — I428 Other cardiomyopathies: Secondary | ICD-10-CM | POA: Insufficient documentation

## 2015-06-26 DIAGNOSIS — I130 Hypertensive heart and chronic kidney disease with heart failure and stage 1 through stage 4 chronic kidney disease, or unspecified chronic kidney disease: Secondary | ICD-10-CM | POA: Insufficient documentation

## 2015-06-26 DIAGNOSIS — E669 Obesity, unspecified: Secondary | ICD-10-CM

## 2015-06-26 DIAGNOSIS — Z91199 Patient's noncompliance with other medical treatment and regimen due to unspecified reason: Secondary | ICD-10-CM

## 2015-06-26 DIAGNOSIS — N182 Chronic kidney disease, stage 2 (mild): Secondary | ICD-10-CM

## 2015-06-26 DIAGNOSIS — I2510 Atherosclerotic heart disease of native coronary artery without angina pectoris: Secondary | ICD-10-CM | POA: Diagnosis not present

## 2015-06-26 DIAGNOSIS — E039 Hypothyroidism, unspecified: Secondary | ICD-10-CM | POA: Diagnosis not present

## 2015-06-26 DIAGNOSIS — Z7982 Long term (current) use of aspirin: Secondary | ICD-10-CM | POA: Diagnosis not present

## 2015-06-26 MED ORDER — TORSEMIDE 20 MG PO TABS: ORAL_TABLET | ORAL | Status: DC

## 2015-06-26 NOTE — Patient Instructions (Signed)
INCREASE Torsemide to 60 mg (3 tabs) in the AM and 40 mg (2 tabs) in the PM  Take Metolazone for 2 days  Your physician recommends that you schedule a follow-up appointment in: 2 weeks in the In the [facility]

## 2015-06-26 NOTE — Progress Notes (Signed)
Patient ID: Jennessa Parma, female   DOB: 01-23-1951, 65 y.o.   MRN: [id number]   PCP: Dr Scot Dock  Cardiologist: Dr Ron Parker  Endocrinologist: Dr Bubba Camp  Primary HF Cardiologist: Dr Vaughan Browner   HPI: Mrs Harer is a 65 year old with a history of RBBB, DM, Hypothyroid, diastolic heart failure, CAD MI 2000/2007 BMS 2007, TIA, obesity.   Admitted 06/07/15 with N,V, hyperglycemia. Creatinine was up to 1.9. On the day discharge creatinine was back down to 1.2 Thought to be dehydrated so diuretics were held. Diuretics restarted on the day discharge. Discharge weight 282 pounds.   She returns for follow up. Overall feeling awful. Increased leg edema. Has not been taking metolazone. SOB with exertion. + Orthopnea. Denies PND. Increased leg edema. Weight at home trending up from 280--->290 pounds. She has not been taking metolazone and she is only taking torsemide 40 mg in am and 20 mg in pm. Not taking potassium due to cost.  She is not sure why she changed torsemide dose.    [place] 09/22/13: RA = 4  RV = 30/5/7  PA = 25/10 (16)  PCW = 7  Fick cardiac output/index = 6.3/2.7  PVR = 1.5 WU   FA sat = 93%  PA sat = 61%, 66%   Labs: 07/18/13 K 4.8 Creatinine 1.38 Glucose 1005 she refused to go to [facility] Labs : 08/17/13 K 3.9 Creatinine 1.4 Glucose 499 Labs: 09/29/13 K 4.5 Creatinine 1.4  Labs 04/18/14 K 4.4 Creatinine 1.18 Labs 03/26/2015: K 5.3 Creatinine 1.41  Labs 05/01/2015: K 4.9 Creatinine 1.8 Labs 05/04/2015: K 5.0 Creatinine 1.74  Labs 06/10/2015: K 4.7 Creatinine 1.21 Labs 06/12/2015: K 6.3 Creatinine 2.01  Labs 06/19/2015: K 4.5 Creatinine 1.29   SH: Former smoker quit 15 year ago. Does drink alcohol. She is disabled. Lives at home with her husband and disabled son - Husband is truck Geophysicist/field seismologist. Son has a brain injury after he attempted suicide 2 years ago.    FH: Mom MI  ROS: All systems negative except as listed in HPI, PMH and Problem List.  Past Medical History  Diagnosis Date  .  Tachycardia     Sinus tachycardia  . Dyslipidemia   . Hypothyroidism   . CAD (coronary artery disease)     MI in 2000 - MI  2007 - treated bare metal stent (no nuclear since then as 9/11)  . HTN (hypertension)   . Chronic diastolic heart failure (HCC)     a) ECHO (08/2013) EF 55-60% and RV function nl b) RHC (08/2013) RA 4, RV 30/5/7, PA 25/10 (16), PCWP 7, Fick CO/CI 6.3/2.7, PVR 1.5 WU, PA 61 and 66%  . RBBB (right bundle branch block)     Old  . Urinary incontinence   . Obesity   . Syncope     likely due to low blood sugar  . Carotid artery disease ([name])   . Acute MI ([place]) 1999; 2007  . CHF (congestive heart failure) ([place])   . Anxiety   . Depression   . Type II diabetes mellitus ([place])   . Peripheral neuropathy ([place])   . Venous insufficiency   . Stroke [facility])     mini strokes  . PONV (postoperative nausea and vomiting)   . Renal insufficiency     DENIES  . Anemia     hx  . Anginal pain ([place])   . Exertional shortness of breath   . Daily headache     "~ every other day;  since I fell in June" (03/15/2014)  . Osteoarthritis   . Arthritis     "generalized" (03/15/2014)    Current Outpatient Prescriptions  Medication Sig Dispense Refill  . albuterol (PROVENTIL HFA;VENTOLIN HFA) 108 (90 BASE) MCG/ACT inhaler Inhale 2 puffs into the lungs every 6 (six) hours as needed for wheezing or shortness of breath.    Marland Kitchen aspirin EC 81 MG tablet Take 81 mg by mouth daily.    . carvedilol (COREG) 12.5 MG tablet Take 12.5 mg by mouth 2 (two) times daily with a meal.    . clonazePAM (KLONOPIN) 1 MG tablet Take 1 mg by mouth 3 (three) times daily as needed for anxiety.    . clopidogrel (PLAVIX) 75 MG tablet Take 1 tablet (75 mg total) by mouth daily with breakfast. 90 tablet 3  . insulin NPH-regular Human (NOVOLIN 70/30) (70-30) 100 UNIT/ML injection Inject 12-75 Units into the skin 3 (three) times daily with meals. 75 units in the morning, and 50 with lunch and 12 in the evening    . isosorbide  dinitrate (ISORDIL) 10 MG tablet TAKE 1 TABLET (10 MG TOTAL) BY MOUTH 2 (TWO) TIMES DAILY. 60 tablet 10  . levothyroxine (SYNTHROID, LEVOTHROID) 50 MCG tablet Take 50 mcg by mouth daily before breakfast.    . losartan (COZAAR) 50 MG tablet TAKE 1 TABLET BY MOUTH DAILY 90 tablet 1  . metFORMIN (GLUMETZA) 500 MG (MOD) 24 hr tablet Take 500 mg by mouth 2 (two) times daily with a meal.    . PARoxetine (PAXIL-CR) 25 MG 24 hr tablet TAKE 1 TABLET (25 MG TOTAL) BY MOUTH DAILY. TAKEN WITH 37.5 FOR A TOTAL = 62.5MG  30 tablet 5  . PARoxetine (PAXIL-CR) 37.5 MG 24 hr tablet TAKE 1 TABLET (37.5 MG TOTAL) BY MOUTH DAILY. TAKEN WITH 25MG  FOR A TOTAL = 62.5 MG 30 tablet 5  . pregabalin (LYRICA) 150 MG capsule Take 150 mg by mouth 3 (three) times daily.     Marland Kitchen torsemide (DEMADEX) 20 MG tablet Take 20-40 mg by mouth 2 (two) times daily. Take 40 mg (2 tablets) in the morning and 20 mg (1 tablet) in the afternoon    . TRADJENTA 5 MG TABS tablet Take 5 mg by mouth daily.  6  . metolazone (ZAROXOLYN) 2.5 MG tablet Take 2.5 mg by mouth 2 (two) times a week. Reported on 06/26/2015    . nitroGLYCERIN (NITROSTAT) 0.4 MG SL tablet Place 1 tablet (0.4 mg total) under the tongue every 5 (five) minutes as needed for chest pain. (Patient not taking: Reported on 06/26/2015) 25 tablet 3   No current facility-administered medications for this encounter.    Filed Vitals:   06/26/15 1437  BP: 132/64  Pulse: 109  Weight: 293 lb 9.6 oz (133.176 kg)  SpO2: 96%   PHYSICAL EXAM: General:  Chronically ill appearing. Dyspneic walking in the clinic.   HEENT: normal Neck: supple. JVP difficult to assess d/t body habitus. Mildly elevated.    Carotids 2+ bilaterally; no bruits. No lymphadenopathy or thryomegaly appreciated. Cor: PMI normal. Regular rate & rhythm. No rubs, gallops or murmurs. Lungs: clear Abdomen: obese, soft, nontender, nondistended. No hepatosplenomegaly. No bruits or masses. Good bowel sounds. Extremities: no  cyanosis, clubbing, rash, R and LLE 2-3+ edema  Neuro: alert & orientedx3, cranial nerves grossly intact. Moves all 4 extremities w/o difficulty. Affect pleasant.    ASSESSMENT & PLAN:  1. Chronic Systolic Heart Failure: NICM, EF 55-60% Grade II DD(05/2015 )  -- NYHA  III symptoms. Volume status elevated but has not been taking torsemide or metolazone as ordered. Weight up 14 pounds.  Today I have asked her to take tosemide 60 mg in am and 40 mg  In pm.  Also instructed to take metolazone for the next 2 days. No potassium with recent hyperkalemia.  - Continue losartan 50 mg daily.  - Reinforced the need and importance of daily weights, a low sodium diet, and fluid restriction (less than 2 L a day). Instructed to call the HF clinic if weight increases more than 3 lbs overnight or 5 lbs in a week 2.. Dyspnea- Ongoing dyspnea with exertion. She has been offered sleep study many times but refuses.   3. Uncontrolled DM:  Recent admit with hyperglycemia. Has follow up with PCP.   4. CAD- no evidence of ischemia- on aspirin and statin.  5. Obesity: Discussed portion control the importance of weight loss.  6. HTN- Stable continue current regimen.  7. CKD Stage II- Check BMEt next visit.   She remains at hight risk for hospital readmits due to poor insight. Follow up in 2 weeks. Refer back to paramedicine. Darrick Grinder NP-C  2:52 PM

## 2015-06-26 NOTE — Progress Notes (Signed)
Advanced Heart Failure Medication Review by a Pharmacist  Does the patient  feel that his/her medications are working for him/her?  yes  Has the patient been experiencing any side effects to the medications prescribed?  no  Does the patient measure his/her own blood pressure or blood glucose at home?  no   Does the patient have any problems obtaining medications due to transportation or finances?   no  Understanding of regimen: poor Understanding of indications: poor Potential of compliance: poor Patient understands to avoid NSAIDs. Patient understands to avoid decongestants.  Issues to address at subsequent visits: Compliance/understanding of regimen   Pharmacist comments:  Ms. Suchodolski is a pleasant 65 yo F presenting without a medication list. She admits to poor compliance with her diuretic regimen 2/2 not understanding her most recent directions. She also has not taken any potassium since her last visit because she states she was told to discontinue use. Will re-enforce importance of compliance with her and review any medication changes made today.   Ruta Hinds. Velva Harman, PharmD, BCPS, CPP Clinical Pharmacist Pager: [phone] Phone: [phone] 06/26/2015 2:59 PM      Time with patient: 14 minutes Preparation and documentation time: 2 minutes Total time: 16 minutes

## 2015-06-28 ENCOUNTER — Other Ambulatory Visit: Payer: Self-pay | Admitting: Physician Assistant

## 2015-06-29 ENCOUNTER — Encounter (HOSPITAL_BASED_OUTPATIENT_CLINIC_OR_DEPARTMENT_OTHER): Payer: PPO | Attending: Internal Medicine

## 2015-06-29 ENCOUNTER — Other Ambulatory Visit: Payer: Self-pay

## 2015-06-29 DIAGNOSIS — E1136 Type 2 diabetes mellitus with diabetic cataract: Secondary | ICD-10-CM | POA: Diagnosis not present

## 2015-06-29 DIAGNOSIS — Z794 Long term (current) use of insulin: Secondary | ICD-10-CM | POA: Diagnosis not present

## 2015-06-29 DIAGNOSIS — M1990 Unspecified osteoarthritis, unspecified site: Secondary | ICD-10-CM | POA: Insufficient documentation

## 2015-06-29 DIAGNOSIS — Z6841 Body Mass Index (BMI) 40.0 and over, adult: Secondary | ICD-10-CM | POA: Insufficient documentation

## 2015-06-29 DIAGNOSIS — L97821 Non-pressure chronic ulcer of other part of left lower leg limited to breakdown of skin: Secondary | ICD-10-CM | POA: Diagnosis not present

## 2015-06-29 DIAGNOSIS — I2510 Atherosclerotic heart disease of native coronary artery without angina pectoris: Secondary | ICD-10-CM | POA: Diagnosis not present

## 2015-06-29 DIAGNOSIS — I252 Old myocardial infarction: Secondary | ICD-10-CM | POA: Diagnosis not present

## 2015-06-29 DIAGNOSIS — E785 Hyperlipidemia, unspecified: Secondary | ICD-10-CM | POA: Diagnosis not present

## 2015-06-29 DIAGNOSIS — E11621 Type 2 diabetes mellitus with foot ulcer: Secondary | ICD-10-CM | POA: Diagnosis not present

## 2015-06-29 DIAGNOSIS — E1140 Type 2 diabetes mellitus with diabetic neuropathy, unspecified: Secondary | ICD-10-CM | POA: Diagnosis not present

## 2015-06-29 DIAGNOSIS — L97211 Non-pressure chronic ulcer of right calf limited to breakdown of skin: Secondary | ICD-10-CM | POA: Insufficient documentation

## 2015-06-29 DIAGNOSIS — E039 Hypothyroidism, unspecified: Secondary | ICD-10-CM | POA: Diagnosis not present

## 2015-06-29 DIAGNOSIS — D649 Anemia, unspecified: Secondary | ICD-10-CM | POA: Insufficient documentation

## 2015-06-29 DIAGNOSIS — E11622 Type 2 diabetes mellitus with other skin ulcer: Secondary | ICD-10-CM | POA: Insufficient documentation

## 2015-06-29 DIAGNOSIS — I87332 Chronic venous hypertension (idiopathic) with ulcer and inflammation of left lower extremity: Secondary | ICD-10-CM | POA: Diagnosis not present

## 2015-06-29 DIAGNOSIS — L97811 Non-pressure chronic ulcer of other part of right lower leg limited to breakdown of skin: Secondary | ICD-10-CM | POA: Diagnosis not present

## 2015-06-29 DIAGNOSIS — I110 Hypertensive heart disease with heart failure: Secondary | ICD-10-CM | POA: Diagnosis not present

## 2015-06-29 DIAGNOSIS — I890 Lymphedema, not elsewhere classified: Secondary | ICD-10-CM | POA: Diagnosis not present

## 2015-06-29 DIAGNOSIS — Z87891 Personal history of nicotine dependence: Secondary | ICD-10-CM | POA: Insufficient documentation

## 2015-06-29 DIAGNOSIS — X35 Volcanic eruption: Secondary | ICD-10-CM | POA: Diagnosis not present

## 2015-06-29 DIAGNOSIS — I509 Heart failure, unspecified: Secondary | ICD-10-CM | POA: Insufficient documentation

## 2015-06-29 NOTE — Patient Outreach (Signed)
[facility]) Care Management  06/29/2015  Lamyra Josephson 07-01-1950 [id number]   Referral Date:  06/21/2015 Source:  [place] Issue:  Medication management and disease management, cost of medications, needs resources for cardiac and other medications.    Admissions: (1)  06/07/2015 - 06/10/2015 ER visits:  (1)  Outreach call #1 to patient.  Contact, Son reached and states patient is at the hospital.  (no notes in Epic regarding this issue at this time).   RN CM scheduled for next outreach call within one week.   Mariann Laster, RN, BSN, [facility], CCM  [employer] Management Coordinator [phone] Direct [phone] Cell [phone] Office [phone] Fax

## 2015-06-29 NOTE — Telephone Encounter (Signed)
Refill appropriate and filled per protocol. 

## 2015-07-02 ENCOUNTER — Other Ambulatory Visit: Payer: Self-pay

## 2015-07-02 NOTE — Patient Outreach (Signed)
[name]) Care Management  07/02/2015  Susan [name] 05-25-1951 [id number]   Referral Date: 06/21/2015 Source: [place] Issue: Medication management and disease management, cost of medications, needs resources for cardiac and other medications.  Admissions: (1) 06/07/2015 - 06/10/2015 ER visits: (1)  Outreach call #2 to patient. Patient not reached at home or cell #.   RN CM scheduled for next outreach call within one week.   Mariann Laster, RN, BSN, [facility], CCM  [employer] Management Coordinator [phone] Direct [phone] Cell [phone] Office [phone] Fax

## 2015-07-04 ENCOUNTER — Other Ambulatory Visit: Payer: Self-pay

## 2015-07-04 NOTE — Patient Outreach (Signed)
[facility]) Care Management  07/04/2015  Susan [name] July 29, 1950 [id number]  Referral Date: 06/21/2015 Source: [place] Issue: Medication management and disease management, cost of medications, needs resources for cardiac and other medications.  Admissions: (1) 06/07/2015 - 06/10/2015 ER visits: (1)  Outreach call #3 to patient. Patient not reached at home or cell #.  RN CM mailed unsuccessful outreach letter.  RN CM will review for response within 2 weeks.   Mariann Laster, RN, BSN, [facility], CCM  [employer] Management Coordinator [phone] Direct [phone] Cell [phone] Office [phone] Fax

## 2015-07-09 ENCOUNTER — Inpatient Hospital Stay (HOSPITAL_COMMUNITY): Payer: PPO

## 2015-07-09 ENCOUNTER — Encounter (HOSPITAL_COMMUNITY): Payer: Self-pay | Admitting: Emergency Medicine

## 2015-07-09 ENCOUNTER — Encounter: Payer: Self-pay | Admitting: Physician Assistant

## 2015-07-09 ENCOUNTER — Emergency Department (HOSPITAL_COMMUNITY): Payer: PPO

## 2015-07-09 ENCOUNTER — Inpatient Hospital Stay (HOSPITAL_COMMUNITY)
Admission: EM | Admit: 2015-07-09 | Discharge: 2015-07-12 | DRG: 638 | Disposition: A | Discharge status: Home / Self Care | Payer: PPO | Attending: Internal Medicine | Admitting: Internal Medicine

## 2015-07-09 ENCOUNTER — Ambulatory Visit (INDEPENDENT_AMBULATORY_CARE_PROVIDER_SITE_OTHER): Payer: PPO | Admitting: Physician Assistant

## 2015-07-09 VITALS — BP 142/98 | HR 96 | Temp 98.1°F | Resp 20 | Wt 279.0 lb

## 2015-07-09 DIAGNOSIS — D509 Iron deficiency anemia, unspecified: Secondary | ICD-10-CM | POA: Diagnosis not present

## 2015-07-09 DIAGNOSIS — E1142 Type 2 diabetes mellitus with diabetic polyneuropathy: Secondary | ICD-10-CM | POA: Diagnosis present

## 2015-07-09 DIAGNOSIS — R9430 Abnormal result of cardiovascular function study, unspecified: Secondary | ICD-10-CM

## 2015-07-09 DIAGNOSIS — R0602 Shortness of breath: Secondary | ICD-10-CM

## 2015-07-09 DIAGNOSIS — I2510 Atherosclerotic heart disease of native coronary artery without angina pectoris: Secondary | ICD-10-CM

## 2015-07-09 DIAGNOSIS — Z87891 Personal history of nicotine dependence: Secondary | ICD-10-CM | POA: Diagnosis not present

## 2015-07-09 DIAGNOSIS — I4510 Unspecified right bundle-branch block: Secondary | ICD-10-CM

## 2015-07-09 DIAGNOSIS — N390 Urinary tract infection, site not specified: Secondary | ICD-10-CM | POA: Diagnosis not present

## 2015-07-09 DIAGNOSIS — E871 Hypo-osmolality and hyponatremia: Secondary | ICD-10-CM | POA: Diagnosis not present

## 2015-07-09 DIAGNOSIS — Z794 Long term (current) use of insulin: Secondary | ICD-10-CM | POA: Diagnosis not present

## 2015-07-09 DIAGNOSIS — F32A Depression, unspecified: Secondary | ICD-10-CM

## 2015-07-09 DIAGNOSIS — E1310 Other specified diabetes mellitus with ketoacidosis without coma: Secondary | ICD-10-CM | POA: Diagnosis not present

## 2015-07-09 DIAGNOSIS — I10 Essential (primary) hypertension: Secondary | ICD-10-CM | POA: Diagnosis present

## 2015-07-09 DIAGNOSIS — B9620 Unspecified Escherichia coli [E. coli] as the cause of diseases classified elsewhere: Secondary | ICD-10-CM | POA: Diagnosis present

## 2015-07-09 DIAGNOSIS — Z6841 Body Mass Index (BMI) 40.0 and over, adult: Secondary | ICD-10-CM | POA: Diagnosis not present

## 2015-07-09 DIAGNOSIS — E1122 Type 2 diabetes mellitus with diabetic chronic kidney disease: Secondary | ICD-10-CM | POA: Diagnosis present

## 2015-07-09 DIAGNOSIS — Z885 Allergy status to narcotic agent status: Secondary | ICD-10-CM | POA: Diagnosis not present

## 2015-07-09 DIAGNOSIS — N179 Acute kidney failure, unspecified: Secondary | ICD-10-CM | POA: Diagnosis not present

## 2015-07-09 DIAGNOSIS — E860 Dehydration: Secondary | ICD-10-CM | POA: Diagnosis present

## 2015-07-09 DIAGNOSIS — Z9119 Patient's noncompliance with other medical treatment and regimen: Secondary | ICD-10-CM

## 2015-07-09 DIAGNOSIS — R739 Hyperglycemia, unspecified: Secondary | ICD-10-CM

## 2015-07-09 DIAGNOSIS — N289 Disorder of kidney and ureter, unspecified: Secondary | ICD-10-CM

## 2015-07-09 DIAGNOSIS — R079 Chest pain, unspecified: Secondary | ICD-10-CM | POA: Diagnosis not present

## 2015-07-09 DIAGNOSIS — F419 Anxiety disorder, unspecified: Secondary | ICD-10-CM | POA: Diagnosis not present

## 2015-07-09 DIAGNOSIS — Z91199 Patient's noncompliance with other medical treatment and regimen due to unspecified reason: Secondary | ICD-10-CM

## 2015-07-09 DIAGNOSIS — K529 Noninfective gastroenteritis and colitis, unspecified: Secondary | ICD-10-CM | POA: Diagnosis present

## 2015-07-09 DIAGNOSIS — R0683 Snoring: Secondary | ICD-10-CM

## 2015-07-09 DIAGNOSIS — N182 Chronic kidney disease, stage 2 (mild): Secondary | ICD-10-CM

## 2015-07-09 DIAGNOSIS — K2970 Gastritis, unspecified, without bleeding: Secondary | ICD-10-CM | POA: Diagnosis present

## 2015-07-09 DIAGNOSIS — Z955 Presence of coronary angioplasty implant and graft: Secondary | ICD-10-CM

## 2015-07-09 DIAGNOSIS — E785 Hyperlipidemia, unspecified: Secondary | ICD-10-CM | POA: Diagnosis not present

## 2015-07-09 DIAGNOSIS — R112 Nausea with vomiting, unspecified: Secondary | ICD-10-CM | POA: Diagnosis not present

## 2015-07-09 DIAGNOSIS — M1990 Unspecified osteoarthritis, unspecified site: Secondary | ICD-10-CM | POA: Diagnosis not present

## 2015-07-09 DIAGNOSIS — N183 Chronic kidney disease, stage 3 (moderate): Secondary | ICD-10-CM | POA: Diagnosis present

## 2015-07-09 DIAGNOSIS — Z8673 Personal history of transient ischemic attack (TIA), and cerebral infarction without residual deficits: Secondary | ICD-10-CM

## 2015-07-09 DIAGNOSIS — E1110 Type 2 diabetes mellitus with ketoacidosis without coma: Secondary | ICD-10-CM

## 2015-07-09 DIAGNOSIS — I779 Disorder of arteries and arterioles, unspecified: Secondary | ICD-10-CM | POA: Diagnosis not present

## 2015-07-09 DIAGNOSIS — M75101 Unspecified rotator cuff tear or rupture of right shoulder, not specified as traumatic: Secondary | ICD-10-CM

## 2015-07-09 DIAGNOSIS — Z7902 Long term (current) use of antithrombotics/antiplatelets: Secondary | ICD-10-CM

## 2015-07-09 DIAGNOSIS — E669 Obesity, unspecified: Secondary | ICD-10-CM

## 2015-07-09 DIAGNOSIS — R197 Diarrhea, unspecified: Secondary | ICD-10-CM | POA: Diagnosis not present

## 2015-07-09 DIAGNOSIS — I248 Other forms of acute ischemic heart disease: Secondary | ICD-10-CM | POA: Diagnosis present

## 2015-07-09 DIAGNOSIS — I5022 Chronic systolic (congestive) heart failure: Secondary | ICD-10-CM | POA: Diagnosis present

## 2015-07-09 DIAGNOSIS — I739 Peripheral vascular disease, unspecified: Secondary | ICD-10-CM

## 2015-07-09 DIAGNOSIS — R1110 Vomiting, unspecified: Secondary | ICD-10-CM | POA: Diagnosis present

## 2015-07-09 DIAGNOSIS — R0600 Dyspnea, unspecified: Secondary | ICD-10-CM | POA: Diagnosis not present

## 2015-07-09 DIAGNOSIS — E039 Hypothyroidism, unspecified: Secondary | ICD-10-CM | POA: Diagnosis not present

## 2015-07-09 DIAGNOSIS — Z7984 Long term (current) use of oral hypoglycemic drugs: Secondary | ICD-10-CM

## 2015-07-09 DIAGNOSIS — I252 Old myocardial infarction: Secondary | ICD-10-CM

## 2015-07-09 DIAGNOSIS — E118 Type 2 diabetes mellitus with unspecified complications: Secondary | ICD-10-CM

## 2015-07-09 DIAGNOSIS — R531 Weakness: Secondary | ICD-10-CM

## 2015-07-09 DIAGNOSIS — E1165 Type 2 diabetes mellitus with hyperglycemia: Principal | ICD-10-CM | POA: Diagnosis present

## 2015-07-09 DIAGNOSIS — R0789 Other chest pain: Secondary | ICD-10-CM | POA: Diagnosis not present

## 2015-07-09 DIAGNOSIS — E872 Acidosis: Secondary | ICD-10-CM | POA: Diagnosis not present

## 2015-07-09 DIAGNOSIS — I5023 Acute on chronic systolic (congestive) heart failure: Secondary | ICD-10-CM | POA: Diagnosis present

## 2015-07-09 DIAGNOSIS — I25119 Atherosclerotic heart disease of native coronary artery with unspecified angina pectoris: Secondary | ICD-10-CM | POA: Diagnosis present

## 2015-07-09 DIAGNOSIS — I130 Hypertensive heart and chronic kidney disease with heart failure and stage 1 through stage 4 chronic kidney disease, or unspecified chronic kidney disease: Secondary | ICD-10-CM | POA: Diagnosis present

## 2015-07-09 DIAGNOSIS — I5032 Chronic diastolic (congestive) heart failure: Secondary | ICD-10-CM | POA: Diagnosis not present

## 2015-07-09 DIAGNOSIS — N189 Chronic kidney disease, unspecified: Secondary | ICD-10-CM

## 2015-07-09 DIAGNOSIS — R7309 Other abnormal glucose: Secondary | ICD-10-CM | POA: Diagnosis not present

## 2015-07-09 DIAGNOSIS — E876 Hypokalemia: Secondary | ICD-10-CM | POA: Diagnosis not present

## 2015-07-09 DIAGNOSIS — R000 Tachycardia, unspecified: Secondary | ICD-10-CM

## 2015-07-09 DIAGNOSIS — Z7982 Long term (current) use of aspirin: Secondary | ICD-10-CM | POA: Diagnosis not present

## 2015-07-09 DIAGNOSIS — R42 Dizziness and giddiness: Secondary | ICD-10-CM

## 2015-07-09 DIAGNOSIS — Z23 Encounter for immunization: Secondary | ICD-10-CM | POA: Diagnosis not present

## 2015-07-09 DIAGNOSIS — F329 Major depressive disorder, single episode, unspecified: Secondary | ICD-10-CM | POA: Diagnosis present

## 2015-07-09 DIAGNOSIS — Z01810 Encounter for preprocedural cardiovascular examination: Secondary | ICD-10-CM

## 2015-07-09 DIAGNOSIS — IMO0002 Reserved for concepts with insufficient information to code with codable children: Secondary | ICD-10-CM

## 2015-07-09 LAB — BASIC METABOLIC PANEL
Anion gap: 15 (ref 5–15)
BUN: 36 mg/dL — ABNORMAL HIGH (ref 6–20)
CO2: 19 mmol/L — ABNORMAL LOW (ref 22–32)
Calcium: 8.7 mg/dL — ABNORMAL LOW (ref 8.9–10.3)
Chloride: 93 mmol/L — ABNORMAL LOW (ref 101–111)
Creatinine, Ser: 1.58 mg/dL — ABNORMAL HIGH (ref 0.44–1.00)
GFR calc Af Amer: 38 mL/min — ABNORMAL LOW (ref >60)
GFR calc non Af Amer: 33 mL/min — ABNORMAL LOW (ref >60)
Glucose, Bld: 709 mg/dL (ref 65–99)
Potassium: 4.7 mmol/L (ref 3.5–5.1)
Sodium: 127 mmol/L — ABNORMAL LOW (ref 135–145)

## 2015-07-09 LAB — I-STAT VENOUS BLOOD GAS, ED
Acid-base deficit: 2 mmol/L (ref 0.0–2.0)
Bicarbonate: 21.3 mEq/L (ref 20.0–24.0)
O2 Saturation: 87 %
TCO2: 22 mmol/L (ref 0–100)
pCO2, Ven: 32.8 mmHg — ABNORMAL LOW (ref 45.0–50.0)
pH, Ven: 7.421 — ABNORMAL HIGH (ref 7.250–7.300)
pO2, Ven: 52 mmHg — ABNORMAL HIGH (ref 30.0–45.0)

## 2015-07-09 LAB — URINALYSIS, ROUTINE W REFLEX MICROSCOPIC
Bilirubin Urine: NEGATIVE
Glucose, UA: >1000 mg/dL — AB
Ketones, ur: 15 mg/dL — AB
Leukocytes, UA: NEGATIVE
Nitrite: NEGATIVE
Protein, ur: >300 mg/dL — AB
Specific Gravity, Urine: 1.028 (ref 1.005–1.030)
pH: 6 (ref 5.0–8.0)

## 2015-07-09 LAB — I-STAT CG4 LACTIC ACID, ED: Lactic Acid, Venous: 1.86 mmol/L (ref 0.5–2.0)

## 2015-07-09 LAB — CBG MONITORING, ED
Glucose-Capillary: >600 mg/dL (ref 65–99)
Glucose-Capillary: 470 mg/dL — ABNORMAL HIGH (ref 65–99)
Glucose-Capillary: 345 mg/dL — ABNORMAL HIGH (ref 65–99)
Glucose-Capillary: 308 mg/dL — ABNORMAL HIGH (ref 65–99)

## 2015-07-09 LAB — I-STAT TROPONIN, ED: Troponin i, poc: 0.07 ng/mL (ref 0.00–0.08)

## 2015-07-09 LAB — CBC
HCT: 32.3 % — ABNORMAL LOW (ref 36.0–46.0)
Hemoglobin: 9.8 g/dL — ABNORMAL LOW (ref 12.0–15.0)
MCH: 22.6 pg — ABNORMAL LOW (ref 26.0–34.0)
MCHC: 30.3 g/dL (ref 30.0–36.0)
MCV: 74.4 fL — ABNORMAL LOW (ref 78.0–100.0)
Platelets: 253 10*3/uL (ref 150–400)
RBC: 4.34 MIL/uL (ref 3.87–5.11)
RDW: 17.6 % — ABNORMAL HIGH (ref 11.5–15.5)
WBC: 8.3 10*3/uL (ref 4.0–10.5)

## 2015-07-09 LAB — TROPONIN I: Troponin I: 0.06 ng/mL — ABNORMAL HIGH (ref <0.031)

## 2015-07-09 LAB — URINE MICROSCOPIC-ADD ON

## 2015-07-09 MED ORDER — SODIUM CHLORIDE 0.9 % IV SOLN
Freq: Once | INTRAVENOUS | Status: AC
  Administered 2015-07-09: 20:00:00 via INTRAVENOUS

## 2015-07-09 MED ORDER — ONDANSETRON HCL 4 MG/2ML IJ SOLN
4.0000 mg | Freq: Once | INTRAMUSCULAR | Status: AC
  Administered 2015-07-09: 4 mg via INTRAVENOUS
  Filled 2015-07-09: qty 2

## 2015-07-09 MED ORDER — METOCLOPRAMIDE HCL 5 MG/ML IJ SOLN
5.0000 mg | Freq: Four times a day (QID) | INTRAMUSCULAR | Status: DC | PRN
  Administered 2015-07-09: 5 mg via INTRAVENOUS
  Filled 2015-07-09: qty 2

## 2015-07-09 MED ORDER — SODIUM CHLORIDE 0.9 % IV SOLN: Freq: Once | INTRAVENOUS | Status: DC

## 2015-07-09 MED ORDER — SODIUM CHLORIDE 0.9 % IV SOLN
INTRAVENOUS | Status: DC
  Administered 2015-07-09: 5.4 [IU]/h via INTRAVENOUS
  Filled 2015-07-09: qty 2.5

## 2015-07-09 MED ORDER — SODIUM CHLORIDE 0.9 % IV BOLUS (SEPSIS)
1000.0000 mL | Freq: Once | INTRAVENOUS | Status: AC
  Administered 2015-07-09: 1000 mL via INTRAVENOUS

## 2015-07-09 MED ORDER — ASPIRIN 81 MG PO CHEW
324.0000 mg | CHEWABLE_TABLET | Freq: Once | ORAL | Status: AC
  Administered 2015-07-09: 324 mg via ORAL
  Filled 2015-07-09: qty 4

## 2015-07-09 MED ORDER — SODIUM CHLORIDE 0.9 % IV SOLN: INTRAVENOUS | Status: DC

## 2015-07-09 MED ORDER — ONDANSETRON HCL 4 MG/2ML IJ SOLN
4.0000 mg | Freq: Once | INTRAMUSCULAR | Status: DC
  Filled 2015-07-09: qty 2

## 2015-07-09 NOTE — ED Provider Notes (Signed)
CSN: [id number]     Arrival date & time 07/09/15  1601 History   First MD Initiated Contact with Patient 07/09/15 1730     Chief Complaint  Patient presents with  . Chest Pain  . Hyperglycemia     (Consider location/radiation/quality/duration/timing/severity/associated sxs/prior Treatment) HPI Comments: Patient with history of chronic diastolic heart failure, hyperlipidemia, CAD status post stents and diabetes presenting from PCPs office nausea vomiting and diarrhea for the past 3 days. She was told her blood sugar was high in the office. She's not taken her medicines in 3 days due to feeling sick including not taking her insulin. Denies fever. Has had sick contacts at home. She endorses 2 episodes of left-sided chest pain that lasted a few seconds less than a minute each that are now resolved. She endorses body aches, chills, generalized weakness and diffuse abdominal pain. States she vomited about 4 times today with diarrhea as well. She's had chills but no documented fever. Sent from the PCP's office with blood sugar greater than 700.  Patient is a 65 y.o. female presenting with hyperglycemia. The history is provided by the patient and a relative.  Hyperglycemia Associated symptoms: abdominal pain, chest pain, fatigue, fever, nausea, vomiting and weakness   Associated symptoms: no dizziness, no dysuria and no shortness of breath     Past Medical History  Diagnosis Date  . Tachycardia     Sinus tachycardia  . Dyslipidemia   . Hypothyroidism   . CAD (coronary artery disease)     MI in 2000 - MI  2007 - treated bare metal stent (no nuclear since then as 9/11)  . HTN (hypertension)   . Chronic diastolic heart failure (HCC)     a) ECHO (08/2013) EF 55-60% and RV function nl b) RHC (08/2013) RA 4, RV 30/5/7, PA 25/10 (16), PCWP 7, Fick CO/CI 6.3/2.7, PVR 1.5 WU, PA 61 and 66%  . RBBB (right bundle branch block)     Old  . Urinary incontinence   . Obesity   . Syncope     likely due to  low blood sugar  . Carotid artery disease ([place])   . Acute MI ([place]) 1999; 2007  . CHF (congestive heart failure) ([place])   . Anxiety   . Depression   . Type II diabetes mellitus ([place])   . Peripheral neuropathy ([place])   . Venous insufficiency   . Stroke [facility])     mini strokes  . PONV (postoperative nausea and vomiting)   . Renal insufficiency     DENIES  . Anemia     hx  . Anginal pain ([place])   . Exertional shortness of breath   . Daily headache     "~ every other day; since I fell in June" (03/15/2014)  . Osteoarthritis   . Arthritis     "generalized" (03/15/2014)   Past Surgical History  Procedure Laterality Date  . Knee arthroscopy Left 10/25/2006  . Coronary angioplasty with stent placement  1999; 2007    "1 + 1"  . Abdominal hysterectomy  1980's  . Tubal ligation  1970's  . Cataract extraction, bilateral Bilateral ?2013  . Shoulder arthroscopy w/ rotator cuff repair Right 03/14/2014  . Shoulder arthroscopy with open rotator cuff repair Right 03/14/2014    Procedure: RIGHT SHOULDER ARTHROSCOPY WITH BICEPS RELEASE, OPEN SUBSCAPULA REPAIR, OPEN SUPRASPINATUS REPAIR.;  Surgeon: Meredith Pel, MD;  Location: [facility];  Service: Orthopedics;  Laterality: Right;  . Right heart catheterization N/A 09/22/2013  Procedure: RIGHT HEART CATH;  Surgeon: Jolaine Artist, MD;  Location: [facility] CATH LAB;  Service: Cardiovascular;  Laterality: N/A;   Family History  Problem Relation Age of Onset  . Coronary artery disease    . Heart attack Mother    Social History  Substance Use Topics  . Smoking status: Former Smoker -- 3.00 packs/day for 32 years    Types: Cigarettes    Quit date: 10/24/1997  . Smokeless tobacco: Never Used  . Alcohol Use: Yes     Comment: "might have 2-3 daiquiris in the summer"   OB History    No data available     Review of Systems  Constitutional: Positive for fever, activity change, appetite change and fatigue.  HENT: Negative for congestion and  rhinorrhea.   Eyes: Negative for visual disturbance.  Respiratory: Negative for cough, chest tightness and shortness of breath.   Cardiovascular: Positive for chest pain.  Gastrointestinal: Positive for nausea, vomiting, abdominal pain and diarrhea.  Genitourinary: Negative for dysuria, hematuria, vaginal bleeding and vaginal discharge.  Musculoskeletal: Positive for myalgias and arthralgias.  Skin: Negative for rash.  Neurological: Positive for weakness. Negative for dizziness and headaches.  A complete 10 system review of systems was obtained and all systems are negative except as noted in the HPI and PMH.      Allergies  Codeine  Home Medications   Prior to Admission medications   Medication Sig Start Date End Date Taking? Authorizing Provider  albuterol (PROVENTIL HFA;VENTOLIN HFA) 108 (90 BASE) MCG/ACT inhaler Inhale 2 puffs into the lungs every 6 (six) hours as needed for wheezing or shortness of breath.   Yes Historical Provider, MD  aspirin EC 81 MG tablet Take 81 mg by mouth daily.   Yes Historical Provider, MD  carvedilol (COREG) 12.5 MG tablet Take 12.5 mg by mouth 2 (two) times daily with a meal.   Yes Historical Provider, MD  clonazePAM (KLONOPIN) 1 MG tablet Take 1 mg by mouth 3 (three) times daily as needed for anxiety.   Yes Historical Provider, MD  clopidogrel (PLAVIX) 75 MG tablet Take 1 tablet (75 mg total) by mouth daily with breakfast. 08/21/14  Yes Shaune Pascal Bensimhon, MD  Fe Cbn-Fe Gluc-FA-B12-C-DSS (FERRALET 90) 90-1 MG TABS Take 1 tablet by mouth daily. 06/13/15  Yes Historical Provider, MD  insulin NPH-regular Human (NOVOLIN 70/30) (70-30) 100 UNIT/ML injection Inject 12-75 Units into the skin 3 (three) times daily with meals. 75 units in the morning, and 50 with lunch and 12 in the evening   Yes Historical Provider, MD  isosorbide dinitrate (ISORDIL) 10 MG tablet TAKE 1 TABLET (10 MG TOTAL) BY MOUTH 2 (TWO) TIMES DAILY. 06/18/15  Yes Burnell Blanks, MD   levothyroxine (SYNTHROID, LEVOTHROID) 50 MCG tablet Take 50 mcg by mouth daily before breakfast.   Yes Historical Provider, MD  losartan (COZAAR) 50 MG tablet TAKE 1 TABLET BY MOUTH DAILY 03/26/15  Yes [name], PA-C  metFORMIN (GLUMETZA) 500 MG (MOD) 24 hr tablet Take 500 mg by mouth 2 (two) times daily with a meal.   Yes Historical Provider, MD  metolazone (ZAROXOLYN) 2.5 MG tablet Take 2.5 mg by mouth 2 (two) times a week. Reported on 06/26/2015   Yes Historical Provider, MD  nitroGLYCERIN (NITROSTAT) 0.4 MG SL tablet Place 1 tablet (0.4 mg total) under the tongue every 5 (five) minutes as needed for chest pain. 05/08/15  Yes Amy D Clegg, NP  PARoxetine (PAXIL-CR) 25 MG 24 hr tablet  TAKE 1 TABLET (25 MG TOTAL) BY MOUTH DAILY. TAKEN WITH 37.5 FOR A TOTAL = 62.5MG  05/10/15  Yes [name], PA-C  PARoxetine (PAXIL-CR) 37.5 MG 24 hr tablet TAKE 1 TABLET (37.5 MG TOTAL) BY MOUTH DAILY. TAKEN WITH 25MG  FOR A TOTAL = 62.5 MG 04/10/15  Yes Mary B Dixon, PA-C  pregabalin (LYRICA) 150 MG capsule Take 150 mg by mouth 3 (three) times daily.    Yes Historical Provider, MD  torsemide (DEMADEX) 20 MG tablet Take 60 mg (3 tablets) in the morning and 40 mg (2tblet) in the afternoon 06/26/15  Yes Amy D Clegg, NP  TRADJENTA 5 MG TABS tablet Take 5 mg by mouth daily. 05/31/15  Yes Historical Provider, MD  acyclovir (ZOVIRAX) 400 MG tablet TAKE 1 TABLET BY MOUTH 3 TIMES A DAY Patient not taking: Reported on 07/09/2015 06/29/15   [name], PA-C   BP 155/69 mmHg  Pulse 99  Temp(Src) 98 F (36.7 C) (Oral)  Resp 18  Ht 5\' 6"  (1.676 m)  Wt 279 lb 11.2 oz (126.871 kg)  BMI 45.17 kg/m2  SpO2 95% Physical Exam  Constitutional: She is oriented to person, place, and time. She appears well-developed and well-nourished. No distress.  HENT:  Head: Normocephalic and atraumatic.  Mouth/Throat: Oropharynx is clear and moist. No oropharyngeal exudate.  Dry mucus membranes  Eyes: Conjunctivae and EOM are normal. Pupils  are equal, round, and reactive to light.  Neck: Normal range of motion. Neck supple.  No meningismus.  Cardiovascular: Normal rate, regular rhythm, normal heart sounds and intact distal pulses.   No murmur heard. Pulmonary/Chest: Effort normal and breath sounds normal. No respiratory distress.  Abdominal: Soft. There is no tenderness. There is no rebound and no guarding.  Musculoskeletal: Normal range of motion. She exhibits no edema or tenderness.  Neurological: She is alert and oriented to person, place, and time. No cranial nerve deficit. She exhibits normal muscle tone. Coordination normal.  No ataxia on finger to nose bilaterally. No pronator drift. 5/5 strength throughout. CN 2-12 intact.Equal grip strength. Sensation intact.   Skin: Skin is warm.  Psychiatric: She has a normal mood and affect. Her behavior is normal.  Nursing note and vitals reviewed.   ED Course  Procedures (including critical care time) Labs Review Labs Reviewed  BASIC METABOLIC PANEL - Abnormal; Notable for the following:    Sodium 127 (*)    Chloride 93 (*)    CO2 19 (*)    Glucose, Bld 709 (*)    BUN 36 (*)    Creatinine, Ser 1.58 (*)    Calcium 8.7 (*)    GFR calc non Af Amer 33 (*)    GFR calc Af Amer 38 (*)    All other components within normal limits  CBC - Abnormal; Notable for the following:    Hemoglobin 9.8 (*)    HCT 32.3 (*)    MCV 74.4 (*)    MCH 22.6 (*)    RDW 17.6 (*)    All other components within normal limits  TROPONIN I - Abnormal; Notable for the following:    Troponin I 0.06 (*)    All other components within normal limits  URINALYSIS, ROUTINE W REFLEX MICROSCOPIC (NOT AT [facility]) - Abnormal; Notable for the following:    Glucose, UA >1000 (*)    Hgb urine dipstick LARGE (*)    Ketones, ur 15 (*)    Protein, ur >300 (*)    All other components  within normal limits  URINE MICROSCOPIC-ADD ON - Abnormal; Notable for the following:    Squamous Epithelial / LPF 0-5 (*)     Bacteria, UA MANY (*)    All other components within normal limits  TROPONIN I - Abnormal; Notable for the following:    Troponin I 0.08 (*)    All other components within normal limits  I-STAT VENOUS BLOOD GAS, ED - Abnormal; Notable for the following:    pH, Ven 7.421 (*)    pCO2, Ven 32.8 (*)    pO2, Ven 52.0 (*)    All other components within normal limits  CBG MONITORING, ED - Abnormal; Notable for the following:    Glucose-Capillary >600 (*)    All other components within normal limits  CBG MONITORING, ED - Abnormal; Notable for the following:    Glucose-Capillary 470 (*)    All other components within normal limits  CBG MONITORING, ED - Abnormal; Notable for the following:    Glucose-Capillary 345 (*)    All other components within normal limits  CBG MONITORING, ED - Abnormal; Notable for the following:    Glucose-Capillary 308 (*)    All other components within normal limits  URINE CULTURE  GASTROINTESTINAL PANEL BY PCR, STOOL (REPLACES STOOL CULTURE)  C DIFFICILE QUICK SCREEN W PCR REFLEX  MRSA PCR SCREENING  TROPONIN I  TROPONIN I  MAGNESIUM  PHOSPHORUS  TSH  COMPREHENSIVE METABOLIC PANEL  CBC  HEMOGLOBIN A1C  VITAMIN B12  FOLATE  IRON AND TIBC  FERRITIN  RETICULOCYTES  I-STAT TROPOININ, ED  I-STAT CG4 LACTIC ACID, ED    Imaging Review Dg Chest 2 View  07/09/2015  CLINICAL DATA:  Chest pain for 1 day and elevated glucose EXAM: CHEST  2 VIEW COMPARISON:  06/12/2015 FINDINGS: The heart size and mediastinal contours are within normal limits. Both lungs are clear. The visualized skeletal structures are unremarkable. IMPRESSION: No active cardiopulmonary disease. Electronically Signed   By: Inez Catalina M.D.   On: 07/09/2015 16:54   Dg Abd 2 Views  07/09/2015  CLINICAL DATA:  65 year old female with nausea and vomiting EXAM: ABDOMEN - 2 VIEW COMPARISON:  Chest x-ray obtained earlier today FINDINGS: The bowel gas pattern is not obstructed. No evidence of free air  on the lateral decubitus view. The lung bases are clear. Multilevel degenerative disease throughout the visualized thoracolumbar spine. IMPRESSION: Negative. Electronically Signed   By: Jacqulynn Cadet M.D.   On: 07/09/2015 21:17   I have personally reviewed and evaluated these images and lab results as part of my medical decision-making.   EKG Interpretation   Date/Time:  Monday July 09 2015 16:06:48 EST Ventricular Rate:  90 PR Interval:  136 QRS Duration: 108 QT Interval:  418 QTC Calculation: 511 R Axis:   -74 Text Interpretation:  Normal sinus rhythm Left axis deviation Right bundle  branch block Possible Inferior infarct , age undetermined Anterolateral  infarct , age undetermined Abnormal ECG No significant change since last  tracing except qrs duration increased Confirmed by KNAPP  MD-J, JON  [id number]) on 07/09/2015 4:13:14 PM      MDM   Final diagnoses:  Hyperglycemia  Nausea vomiting and diarrhea  Chest pain, unspecified chest pain type  Vomiting   Patient from PCPs office with nausea vomiting and diarrhea and hyperglycemia. Also with several episodes of intermittent chest pain none at this time.  Labs show hyperglycemia 709. Anion gap 16. CXR negative, EKG unchanged, minimal troponin elevation.  Chest pain is  atypical for ACS.  Suspect viral GI illness. Will gently hydrate as she does appear dry. Continue insulin gtt for hyperglycemia.   D/w Dr. Roel Cluck.  CRITICAL CARE Performed by: Ezequiel Essex Total critical care time: 40 minutes Critical care time was exclusive of separately billable procedures and treating other patients. Critical care was necessary to treat or prevent imminent or life-threatening deterioration. Critical care was time spent personally by me on the following activities: development of treatment plan with patient and/or surrogate as well as nursing, discussions with consultants, evaluation of patient's response to treatment,  examination of patient, obtaining history from patient or surrogate, ordering and performing treatments and interventions, ordering and review of laboratory studies, ordering and review of radiographic studies, pulse oximetry and re-evaluation of patient's condition.    Ezequiel Essex, MD 07/10/15 [phone]

## 2015-07-09 NOTE — H&P (Addendum)
PCP: [name], PA-C [name]  Referring provider [place]   Chief Complaint:  Nausea vomiting diarrhea HPI: Susan [name] is a 65 y.o. female   has a past medical history of Tachycardia; Dyslipidemia; Hypothyroidism; CAD (coronary artery disease); HTN (hypertension); Chronic diastolic heart failure ([place]); RBBB (right bundle branch block); Urinary incontinence; Obesity; Syncope; Carotid artery disease ([place]); Acute MI ([place]) (1999; 2007); CHF (congestive heart failure) ([place]); Anxiety; Depression; Type II diabetes mellitus ([place]); Peripheral neuropathy ([place]); Venous insufficiency; Stroke [facility]); PONV (postoperative nausea and vomiting); Renal insufficiency; Anemia; Anginal pain ([place]); Exertional shortness of breath; Daily headache; Osteoarthritis; and Arthritis.   Presented with  4 days of nausea vomiting and diarrhea. She has family members with similar illness this week. Patient stopped taking her insulin because she wasn't feeling well. She was trying to check her blood sugar at home but it was reading too high. She presented to her primary care provider today was reporting intermittent chest pain this AM lasting a few seconds radiating to left arm she took nitroglycerine. She reports some shortness of breath no fever. She is not on oxygen at home have had some dry cough. currently chest pain-free. She denies any dysuria.   IN ER: Blood glucose noted to be up to 709 VBG 7.421/32.8 troponin mildly elevated at 0.06 In anion gap 15 bicarbonate 19. Patient was started on Insulin drip   Regarding pertinent past history: Patient has had recurrent admissions for hyperglycemia she was admitted from 12-15 for January for the same. She has history of chronic kidney disease baseline creatinine 1.2 his history of coronary artery disease currently on Plavix last stent was placed into thousand 7  Hospitalist was called for admission for dehydration and gastroenteritis poorly  controlled diabetes with hyperglycemia secondary to noncompliance elevated troponin with atypical chest pain  Review of Systems:    Pertinent positives include: bdominal pain, nausea, vomiting, diarrhea, chest pain, shortness of breath at rest.  non-productive cough, Constitutional:  No weight loss, night sweats, Fevers, chills, fatigue, weight loss  HEENT:  No headaches, Difficulty swallowing,Tooth/dental problems,Sore throat,  No sneezing, itching, ear ache, nasal congestion, post nasal drip,  Cardio-vascular:  No Orthopnea, PND, anasarca, dizziness, palpitations.no Bilateral lower extremity swelling  GI:  No heartburn, indigestion, change in bowel habits, loss of appetite, melena, blood in stool, hematemesis Resp:  no No dyspnea on exertion, No excess mucus, no productive cough, No  No coughing up of blood.No change in color of mucus.No wheezing. Skin:  no rash or lesions. No jaundice GU:  no dysuria, change in color of urine, no urgency or frequency. No straining to urinate.  No flank pain.  Musculoskeletal:  No joint pain or no joint swelling. No decreased range of motion. No back pain.  Psych:  No change in mood or affect. No depression or anxiety. No memory loss.  Neuro: no localizing neurological complaints, no tingling, no weakness, no double vision, no gait abnormality, no slurred speech, no confusion  Otherwise ROS are negative except for above, 10 systems were reviewed  Past Medical History: Past Medical History  Diagnosis Date  . Tachycardia     Sinus tachycardia  . Dyslipidemia   . Hypothyroidism   . CAD (coronary artery disease)     MI in 2000 - MI  2007 - treated bare metal stent (no nuclear since then as 9/11)  . HTN (hypertension)   . Chronic diastolic heart failure (HCC)     a) ECHO (08/2013) EF 55-60% and  RV function nl b) RHC (08/2013) RA 4, RV 30/5/7, PA 25/10 (16), PCWP 7, Fick CO/CI 6.3/2.7, PVR 1.5 WU, PA 61 and 66%  . RBBB (right bundle branch block)      Old  . Urinary incontinence   . Obesity   . Syncope     likely due to low blood sugar  . Carotid artery disease ([place])   . Acute MI ([place]) 1999; 2007  . CHF (congestive heart failure) ([place])   . Anxiety   . Depression   . Type II diabetes mellitus ([place])   . Peripheral neuropathy ([place])   . Venous insufficiency   . Stroke [facility])     mini strokes  . PONV (postoperative nausea and vomiting)   . Renal insufficiency     DENIES  . Anemia     hx  . Anginal pain ([place])   . Exertional shortness of breath   . Daily headache     "~ every other day; since I fell in June" (03/15/2014)  . Osteoarthritis   . Arthritis     "generalized" (03/15/2014)   Past Surgical History  Procedure Laterality Date  . Knee arthroscopy Left 10/25/2006  . Coronary angioplasty with stent placement  1999; 2007    "1 + 1"  . Abdominal hysterectomy  1980's  . Tubal ligation  1970's  . Cataract extraction, bilateral Bilateral ?2013  . Shoulder arthroscopy w/ rotator cuff repair Right 03/14/2014  . Shoulder arthroscopy with open rotator cuff repair Right 03/14/2014    Procedure: RIGHT SHOULDER ARTHROSCOPY WITH BICEPS RELEASE, OPEN SUBSCAPULA REPAIR, OPEN SUPRASPINATUS REPAIR.;  Surgeon: Meredith Pel, MD;  Location: [place];  Service: Orthopedics;  Laterality: Right;  . Right heart catheterization N/A 09/22/2013    Procedure: RIGHT HEART CATH;  Surgeon: Jolaine Artist, MD;  Location: [facility] CATH LAB;  Service: Cardiovascular;  Laterality: N/A;     Medications: Prior to Admission medications   Medication Sig Start Date End Date Taking? Authorizing Provider  acyclovir (ZOVIRAX) 400 MG tablet TAKE 1 TABLET BY MOUTH 3 TIMES A DAY 06/29/15   [name], PA-C  albuterol (PROVENTIL HFA;VENTOLIN HFA) 108 (90 BASE) MCG/ACT inhaler Inhale 2 puffs into the lungs every 6 (six) hours as needed for wheezing or shortness of breath.    Historical Provider, MD  aspirin EC 81 MG tablet Take 81 mg by mouth daily.     Historical Provider, MD  carvedilol (COREG) 12.5 MG tablet Take 12.5 mg by mouth 2 (two) times daily with a meal.    Historical Provider, MD  clonazePAM (KLONOPIN) 1 MG tablet Take 1 mg by mouth 3 (three) times daily as needed for anxiety.    Historical Provider, MD  clopidogrel (PLAVIX) 75 MG tablet Take 1 tablet (75 mg total) by mouth daily with breakfast. 08/21/14   Jolaine Artist, MD  insulin NPH-regular Human (NOVOLIN 70/30) (70-30) 100 UNIT/ML injection Inject 12-75 Units into the skin 3 (three) times daily with meals. 75 units in the morning, and 50 with lunch and 12 in the evening    Historical Provider, MD  isosorbide dinitrate (ISORDIL) 10 MG tablet TAKE 1 TABLET (10 MG TOTAL) BY MOUTH 2 (TWO) TIMES DAILY. 06/18/15   Burnell Blanks, MD  levothyroxine (SYNTHROID, LEVOTHROID) 50 MCG tablet Take 50 mcg by mouth daily before breakfast.    Historical Provider, MD  losartan (COZAAR) 50 MG tablet TAKE 1 TABLET BY MOUTH DAILY 03/26/15   [name], PA-C  metFORMIN (  GLUMETZA) 500 MG (MOD) 24 hr tablet Take 500 mg by mouth 2 (two) times daily with a meal.    Historical Provider, MD  metolazone (ZAROXOLYN) 2.5 MG tablet Take 2.5 mg by mouth 2 (two) times a week. Reported on 06/26/2015    Historical Provider, MD  nitroGLYCERIN (NITROSTAT) 0.4 MG SL tablet Place 1 tablet (0.4 mg total) under the tongue every 5 (five) minutes as needed for chest pain. Patient not taking: Reported on 06/26/2015 05/08/15   Amy D Clegg, NP  PARoxetine (PAXIL-CR) 25 MG 24 hr tablet TAKE 1 TABLET (25 MG TOTAL) BY MOUTH DAILY. TAKEN WITH 37.5 FOR A TOTAL = 62.5MG  05/10/15   [name], PA-C  PARoxetine (PAXIL-CR) 37.5 MG 24 hr tablet TAKE 1 TABLET (37.5 MG TOTAL) BY MOUTH DAILY. TAKEN WITH 25MG  FOR A TOTAL = 62.5 MG 04/10/15   [name], PA-C  pregabalin (LYRICA) 150 MG capsule Take 150 mg by mouth 3 (three) times daily.     Historical Provider, MD  torsemide (DEMADEX) 20 MG tablet Take 60 mg (3 tablets) in the  morning and 40 mg (2tblet) in the afternoon 06/26/15   Amy D Clegg, NP  TRADJENTA 5 MG TABS tablet Take 5 mg by mouth daily. 05/31/15   Historical Provider, MD    Allergies:   Allergies  Allergen Reactions  . Codeine Nausea And Vomiting    Social History:  Ambulatory  Independently  Lives at home  wth family     reports that she quit smoking about 17 years ago. Her smoking use included Cigarettes. She has a 96 pack-year smoking history. She has never used smokeless tobacco. She reports that she drinks alcohol. She reports that she does not use illicit drugs.     Family History: family history includes Heart attack in her mother.    Physical Exam: Patient Vitals for the past 24 hrs:  BP Temp Temp src Pulse Resp SpO2 Height Weight  07/09/15 1728 194/97 mmHg - - - - - - -  07/09/15 1613 190/89 mmHg 98.3 F (36.8 C) Oral 92 16 100 % 5\' 6"  (1.676 m) 122.471 kg (270 lb)    1. General:  in No Acute distress 2. Psychological: Alert and Oriented 3. Head/ENT:   Mucous Membranes                          Head Non traumatic, neck supple                          Normal dentition 4. SKIN:   decreased Skin turgor,  Skin clean Dry and intact mild rash present at the site of a bracelet on the right arm worrisome for topical dermatitis 5. Heart: Regular rate and rhythm no Murmur, Rub or gallop 6. Lungs:  no wheezes occasional mild crackles, but distant   7. Abdomen: Soft, non-tender, Non distended 8. Lower extremities: no clubbing, cyanosis, trace edema 9. Neurologically Grossly intact, moving all 4 extremities equally 10. MSK: Normal range of motion  body mass index is 43.6 kg/(m^2).   Labs on Admission:   Results for orders placed or performed during the hospital encounter of 07/09/15 (from the past 24 hour(s))  Basic metabolic panel     Status: Abnormal   Collection Time: 07/09/15  4:15 PM  Result Value Ref Range   Sodium 127 (L) 135 - 145 mmol/L   Potassium 4.7 3.5 - 5.1  mmol/L     Chloride 93 (L) 101 - 111 mmol/L   CO2 19 (L) 22 - 32 mmol/L   Glucose, Bld 709 (HH) 65 - 99 mg/dL   BUN 36 (H) 6 - 20 mg/dL   Creatinine, Ser 1.58 (H) 0.44 - 1.00 mg/dL   Calcium 8.7 (L) 8.9 - 10.3 mg/dL   GFR calc non Af Amer 33 (L) >60 mL/min   GFR calc Af Amer 38 (L) >60 mL/min   Anion gap 15 5 - 15  CBC     Status: Abnormal   Collection Time: 07/09/15  4:15 PM  Result Value Ref Range   WBC 8.3 4.0 - 10.5 K/uL   RBC 4.34 3.87 - 5.11 MIL/uL   Hemoglobin 9.8 (L) 12.0 - 15.0 g/dL   HCT 32.3 (L) 36.0 - 46.0 %   MCV 74.4 (L) 78.0 - 100.0 fL   MCH 22.6 (L) 26.0 - 34.0 pg   MCHC 30.3 30.0 - 36.0 g/dL   RDW 17.6 (H) 11.5 - 15.5 %   Platelets 253 150 - 400 K/uL  I-stat troponin, ED (not at [facility])     Status: None   Collection Time: 07/09/15  4:28 PM  Result Value Ref Range   Troponin i, poc 0.07 0.00 - 0.08 ng/mL   Comment 3          Urinalysis, Routine w reflex microscopic (not at [facility])     Status: Abnormal   Collection Time: 07/09/15  6:20 PM  Result Value Ref Range   Color, Urine YELLOW YELLOW   APPearance CLEAR CLEAR   Specific Gravity, Urine 1.028 1.005 - 1.030   pH 6.0 5.0 - 8.0   Glucose, UA >1000 (A) NEGATIVE mg/dL   Hgb urine dipstick LARGE (A) NEGATIVE   Bilirubin Urine NEGATIVE NEGATIVE   Ketones, ur 15 (A) NEGATIVE mg/dL   Protein, ur >300 (A) NEGATIVE mg/dL   Nitrite NEGATIVE NEGATIVE   Leukocytes, UA NEGATIVE NEGATIVE  Urine microscopic-add on     Status: Abnormal   Collection Time: 07/09/15  6:20 PM  Result Value Ref Range   Squamous Epithelial / LPF 0-5 (A) NONE SEEN   WBC, UA 6-30 0 - 5 WBC/hpf   RBC / HPF 0-5 0 - 5 RBC/hpf   Bacteria, UA MANY (A) NONE SEEN  I-Stat Venous Blood Gas, ED (order at [facility] and [facility] only)     Status: Abnormal   Collection Time: 07/09/15  6:26 PM  Result Value Ref Range   pH, Ven 7.421 (H) 7.250 - 7.300   pCO2, Ven 32.8 (L) 45.0 - 50.0 mmHg   pO2, Ven 52.0 (H) 30.0 - 45.0 mmHg   Bicarbonate 21.3 20.0 - 24.0 mEq/L   TCO2  22 0 - 100 mmol/L   O2 Saturation 87.0 %   Acid-base deficit 2.0 0.0 - 2.0 mmol/L   Patient temperature HIDE    Sample type VENOUS   I-Stat CG4 Lactic Acid, ED     Status: None   Collection Time: 07/09/15  6:27 PM  Result Value Ref Range   Lactic Acid, Venous 1.86 0.5 - 2.0 mmol/L  Troponin I     Status: Abnormal   Collection Time: 07/09/15  6:27 PM  Result Value Ref Range   Troponin I 0.06 (H) <0.031 ng/mL  CBG monitoring, ED     Status: Abnormal   Collection Time: 07/09/15  6:55 PM  Result Value Ref Range   Glucose-Capillary >600 (HH) 65 - 99 mg/dL  UA 6-30 white blood cells many bacteria negative nitrate  Lab Results  Component Value Date   HGBA1C 9.9* 06/09/2015    Estimated Creatinine Clearance: 46.8 mL/min (by C-G formula based on Cr of 1.58).  BNP (last 3 results) No results for input(s): PROBNP in the last 8760 hours.  Other results:  I have pearsonaly reviewed this: ECG REPORT  Rate: 90  Rhythm: Sinus rhythm right bundle-branch block ST&T Change: No acute ischemic changes QTC 511  Filed Weights   07/09/15 1613  Weight: 122.471 kg (270 lb)     Cultures:    Component Value Date/Time   SDES URINE, CATHETERIZED 12/25/2012 1347   SPECREQUEST NONE 12/25/2012 1347   CULT  12/25/2012 1347    GROUP B STREP(S.AGALACTIAE)ISOLATED Note: TESTING AGAINST S. AGALACTIAE NOT ROUTINELY PERFORMED DUE TO PREDICTABILITY OF AMP/PEN/VAN SUSCEPTIBILITY.   REPTSTATUS 12/26/2012 FINAL 12/25/2012 1347     Radiological Exams on Admission: Dg Chest 2 View  07/09/2015  CLINICAL DATA:  Chest pain for 1 day and elevated glucose EXAM: CHEST  2 VIEW COMPARISON:  06/12/2015 FINDINGS: The heart size and mediastinal contours are within normal limits. Both lungs are clear. The visualized skeletal structures are unremarkable. IMPRESSION: No active cardiopulmonary disease. Electronically Signed   By: Inez Catalina M.D.   On: 07/09/2015 16:54    Chart has been reviewed  Family not   at  Bedside     Assessment/Plan  65 year old female with history of diabetes mellitus type 2 poorly controlled, chronic kidney disease, chronic diastolic heart failure, and chronic kidney disease presents with nausea vomiting diarrhea for the past 4 days hyperglycemia in the setting of noncompliance atypical chest pain with mildly elevated troponin at 0.06  Present on Admission:  Chest pain - - given risk factors will admit, monitor on telemetry, cycle cardiac enzymes, obtain serial ECG. Further risk stratify with lipid panel, hgA1C, obtain TSH. Make sure patient is on Aspirin. Further treatment based on the currently pending results.  . Hyperglycemia will continue glucose stabilizer while hospitalized with transition to long lasting insulin  . Gastroenteritis  - will obtain stool studies. Obtain KUB to evaluate for ileus and/or obstruction . Essential hypertension continue home medications but hold ARB given worsening renal function . Diabetes mellitus type II, uncontrolled ([place]) -  spoke about importance of not skipping insulin altogether patient benefit from diabetic education  . CKD (chronic kidney disease), stage II - acute on chronic worsening renal function in a setting of nausea vomiting diarrhea  . Chronic diastolic CHF (congestive heart failure) (HCC) - given currently decreased by mouth intake nausea vomiting and diarrhea for hold torsemide give gentle fluids and follow when able would resume torsemide to avoid fluid overload  . Carotid artery disease ([place]) continue aspirin and Plavix. Given mildly elevated troponin we'll continue to cycle enzymes and obtain echogram to evaluate for any wall motion abnormality  . Hyperglycemic crisis in diabetes mellitus ([place])  - currently on glucose stabilizer given severity of hyperglycemia blood sugar and 700 range patient qualifies for inpatient admission requiring inpatient medications for stabilization   elevated troponin most likely secondary to  demand ischemia in the setting of nausea vomiting diarrhea decreased by mouth intake and evidence of mild dehydration with worsening renal function. Through ACS unlikely but continued to cycle and monitor on telemetry  Anemia will obtain anemia panel in the morning  Hyponatremia  - in the setting of significant hyper glycemia Evidence of metabolic acidosis likely secondary to dehydration will continue to  follow Prophylaxis:     Lovenox   CODE STATUS:  FULL CODE  as per patient    Disposition:   To home once workup is complete and patient is stable  Other plan as per orders.  I have spent a total of 57 min on this admission   Gerome Kokesh 07/09/2015, 9:04 PM   [facility]  Pager [phone]   after 2 AM please page floor coverage PA If 7AM-7PM, please contact the day team taking care of the patient  [email or web address]  Password [email or web address]

## 2015-07-09 NOTE — Progress Notes (Signed)
Patient ID: Susan [name] MRN: [id number], DOB: 12-16-1950, 65 y.o. Date of Encounter: @DATE @  Chief Complaint:  Chief Complaint  Patient presents with  . sick x days    n/v/d since Friday,  has not kept anything down, has not been taking medicines    HPI: 65 y.o. year old female  presents with   Patient had recent office visit with similar presentation 06/07/2015. At that time her blood sugar was too high to read/undetectable.  At that time she had no transportation to the hospital so EMS was called for transportation to the hospital.  Today she presents with the same symptoms that she had at that visit 06/07/15. Today she did have her husband drive her here for appointment and he is here with her. He comments that he has checked her sugar the last several days and their meter could not give them a reading.   Past Medical History  Diagnosis Date  . Tachycardia     Sinus tachycardia  . Dyslipidemia   . Hypothyroidism   . CAD (coronary artery disease)     MI in 2000 - MI  2007 - treated bare metal stent (no nuclear since then as 9/11)  . HTN (hypertension)   . Chronic diastolic heart failure (HCC)     a) ECHO (08/2013) EF 55-60% and RV function nl b) RHC (08/2013) RA 4, RV 30/5/7, PA 25/10 (16), PCWP 7, Fick CO/CI 6.3/2.7, PVR 1.5 WU, PA 61 and 66%  . RBBB (right bundle branch block)     Old  . Urinary incontinence   . Obesity   . Syncope     likely due to low blood sugar  . Carotid artery disease ([place])   . Acute MI ([place]) 1999; 2007  . CHF (congestive heart failure) ([place])   . Anxiety   . Depression   . Type II diabetes mellitus ([place])   . Peripheral neuropathy ([place])   . Venous insufficiency   . Stroke [facility])     mini strokes  . PONV (postoperative nausea and vomiting)   . Renal insufficiency     DENIES  . Anemia     hx  . Anginal pain ([place])   . Exertional shortness of breath   . Daily headache     "~ every other day; since I fell in June" (03/15/2014)  .  Osteoarthritis   . Arthritis     "generalized" (03/15/2014)     Home Meds: Outpatient Prescriptions Prior to Visit  Medication Sig Dispense Refill  . acyclovir (ZOVIRAX) 400 MG tablet TAKE 1 TABLET BY MOUTH 3 TIMES A DAY 21 tablet 1  . albuterol (PROVENTIL HFA;VENTOLIN HFA) 108 (90 BASE) MCG/ACT inhaler Inhale 2 puffs into the lungs every 6 (six) hours as needed for wheezing or shortness of breath.    Marland Kitchen aspirin EC 81 MG tablet Take 81 mg by mouth daily.    . carvedilol (COREG) 12.5 MG tablet Take 12.5 mg by mouth 2 (two) times daily with a meal.    . clonazePAM (KLONOPIN) 1 MG tablet Take 1 mg by mouth 3 (three) times daily as needed for anxiety.    . clopidogrel (PLAVIX) 75 MG tablet Take 1 tablet (75 mg total) by mouth daily with breakfast. 90 tablet 3  . insulin NPH-regular Human (NOVOLIN 70/30) (70-30) 100 UNIT/ML injection Inject 12-75 Units into the skin 3 (three) times daily with meals. 75 units in the morning, and 50 with lunch and 12 in the evening    .  isosorbide dinitrate (ISORDIL) 10 MG tablet TAKE 1 TABLET (10 MG TOTAL) BY MOUTH 2 (TWO) TIMES DAILY. 60 tablet 10  . levothyroxine (SYNTHROID, LEVOTHROID) 50 MCG tablet Take 50 mcg by mouth daily before breakfast.    . losartan (COZAAR) 50 MG tablet TAKE 1 TABLET BY MOUTH DAILY 90 tablet 1  . metFORMIN (GLUMETZA) 500 MG (MOD) 24 hr tablet Take 500 mg by mouth 2 (two) times daily with a meal.    . metolazone (ZAROXOLYN) 2.5 MG tablet Take 2.5 mg by mouth 2 (two) times a week. Reported on 06/26/2015    . nitroGLYCERIN (NITROSTAT) 0.4 MG SL tablet Place 1 tablet (0.4 mg total) under the tongue every 5 (five) minutes as needed for chest pain. (Patient not taking: Reported on 06/26/2015) 25 tablet 3  . PARoxetine (PAXIL-CR) 25 MG 24 hr tablet TAKE 1 TABLET (25 MG TOTAL) BY MOUTH DAILY. TAKEN WITH 37.5 FOR A TOTAL = 62.5MG  30 tablet 5  . PARoxetine (PAXIL-CR) 37.5 MG 24 hr tablet TAKE 1 TABLET (37.5 MG TOTAL) BY MOUTH DAILY. TAKEN WITH 25MG   FOR A TOTAL = 62.5 MG 30 tablet 5  . pregabalin (LYRICA) 150 MG capsule Take 150 mg by mouth 3 (three) times daily.     Marland Kitchen torsemide (DEMADEX) 20 MG tablet Take 60 mg (3 tablets) in the morning and 40 mg (2tblet) in the afternoon 150 tablet 2  . TRADJENTA 5 MG TABS tablet Take 5 mg by mouth daily.  6   No facility-administered medications prior to visit.    Allergies:  Allergies  Allergen Reactions  . Codeine Nausea And Vomiting    Social History   Social History  . Marital Status: Married    Spouse Name: N/A  . Number of Children: 3  . Years of Education: 12th   Occupational History  .      unemploye   Social History Main Topics  . Smoking status: Former Smoker -- 3.00 packs/day for 32 years    Types: Cigarettes    Quit date: 10/24/1997  . Smokeless tobacco: Never Used  . Alcohol Use: Yes     Comment: "might have 2-3 daiquiris in the summer"  . Drug Use: No  . Sexual Activity: No   Other Topics Concern  . Not on file   Social History Narrative   Pt lives at home with her spouse.   Caffeine Use- 3 sodas daily    Family History  Problem Relation Age of Onset  . Coronary artery disease    . Heart attack Mother      Review of Systems:  See HPI for pertinent ROS. All other ROS negative.    Physical Exam: Blood pressure 142/98, pulse 96, temperature 98.1 F (36.7 C), temperature source Oral, resp. rate 20, weight 279 lb (126.554 kg)., Body mass index is 45.05 kg/(m^2). General: Obese WF. Appears clammy but in no acute distress. Neck: Supple. No thyromegaly. No lymphadenopathy. Lungs: Clear bilaterally to auscultation without wheezes, rales, or rhonchi. Breathing is unlabored. Heart: RRR with S1 S2. No murmurs, rubs, or gallops. Musculoskeletal:  Strength and tone normal for age. Extremities/Skin: Warm and dry. Neuro: Alert and oriented X 3. Moves all extremities spontaneously. Gait is normal. CNII-XII grossly in tact. Psych:  Responds to questions  appropriately with a normal affect.     ASSESSMENT AND PLAN:  65 y.o. year old female with  1. Hyperglycemia Glucose is too high to read. Discussed with patient and her husband and he is to  drive her directly to emergency room. Patient and husband voice understanding and agree and she is going directly to emergency room. - Glucose (CBG)  2. Type 2 diabetes mellitus with complication, with long-term current use of insulin ([place])  3. Non compliance with medical treatment  4. Diabetic ketoacidosis without coma associated with type 2 diabetes mellitus ([place])  5. Uncontrolled type 2 diabetes mellitus with hyperglycemia, with long-term current use of insulin [facility])   Signed, [address] [place], [place], [facility] 07/09/2015 3:46 PM

## 2015-07-09 NOTE — ED Notes (Signed)
Pt states she was sent from her PCP for chest pain that started today around 12pm and also her glucose was greater than 500. Pt states she hasn't taken her insulin in 4 days because she has felt sick and vomited 3 times today.

## 2015-07-09 NOTE — ED Notes (Signed)
Lab called with blood sugar of 709

## 2015-07-10 ENCOUNTER — Encounter (HOSPITAL_COMMUNITY): Payer: PPO

## 2015-07-10 DIAGNOSIS — E1122 Type 2 diabetes mellitus with diabetic chronic kidney disease: Secondary | ICD-10-CM | POA: Diagnosis not present

## 2015-07-10 DIAGNOSIS — I10 Essential (primary) hypertension: Secondary | ICD-10-CM | POA: Diagnosis not present

## 2015-07-10 DIAGNOSIS — R7989 Other specified abnormal findings of blood chemistry: Secondary | ICD-10-CM

## 2015-07-10 DIAGNOSIS — I248 Other forms of acute ischemic heart disease: Secondary | ICD-10-CM | POA: Diagnosis not present

## 2015-07-10 DIAGNOSIS — E872 Acidosis: Secondary | ICD-10-CM | POA: Diagnosis not present

## 2015-07-10 DIAGNOSIS — E1165 Type 2 diabetes mellitus with hyperglycemia: Secondary | ICD-10-CM | POA: Diagnosis not present

## 2015-07-10 DIAGNOSIS — I5032 Chronic diastolic (congestive) heart failure: Secondary | ICD-10-CM | POA: Diagnosis not present

## 2015-07-10 DIAGNOSIS — R739 Hyperglycemia, unspecified: Secondary | ICD-10-CM | POA: Diagnosis not present

## 2015-07-10 DIAGNOSIS — N179 Acute kidney failure, unspecified: Secondary | ICD-10-CM | POA: Diagnosis not present

## 2015-07-10 DIAGNOSIS — N183 Chronic kidney disease, stage 3 (moderate): Secondary | ICD-10-CM

## 2015-07-10 LAB — IRON AND TIBC
Iron: 18 ug/dL — ABNORMAL LOW (ref 28–170)
Saturation Ratios: 7 % — ABNORMAL LOW (ref 10.4–31.8)
TIBC: 269 ug/dL (ref 250–450)
UIBC: 251 ug/dL

## 2015-07-10 LAB — GLUCOSE, CAPILLARY
Glucose-Capillary: 198 mg/dL — ABNORMAL HIGH (ref 65–99)
Glucose-Capillary: 208 mg/dL — ABNORMAL HIGH (ref 65–99)
Glucose-Capillary: 269 mg/dL — ABNORMAL HIGH (ref 65–99)
Glucose-Capillary: 155 mg/dL — ABNORMAL HIGH (ref 65–99)
Glucose-Capillary: 166 mg/dL — ABNORMAL HIGH (ref 65–99)
Glucose-Capillary: 170 mg/dL — ABNORMAL HIGH (ref 65–99)
Glucose-Capillary: 176 mg/dL — ABNORMAL HIGH (ref 65–99)
Glucose-Capillary: 187 mg/dL — ABNORMAL HIGH (ref 65–99)
Glucose-Capillary: 187 mg/dL — ABNORMAL HIGH (ref 65–99)
Glucose-Capillary: 156 mg/dL — ABNORMAL HIGH (ref 65–99)
Glucose-Capillary: 93 mg/dL (ref 65–99)
Glucose-Capillary: 251 mg/dL — ABNORMAL HIGH (ref 65–99)
Glucose-Capillary: 334 mg/dL — ABNORMAL HIGH (ref 65–99)

## 2015-07-10 LAB — CBC
HCT: 29.8 % — ABNORMAL LOW (ref 36.0–46.0)
Hemoglobin: 9.1 g/dL — ABNORMAL LOW (ref 12.0–15.0)
MCH: 22.4 pg — ABNORMAL LOW (ref 26.0–34.0)
MCHC: 30.5 g/dL (ref 30.0–36.0)
MCV: 73.2 fL — ABNORMAL LOW (ref 78.0–100.0)
Platelets: 259 10*3/uL (ref 150–400)
RBC: 4.07 MIL/uL (ref 3.87–5.11)
RDW: 17.7 % — ABNORMAL HIGH (ref 11.5–15.5)
WBC: 7.5 10*3/uL (ref 4.0–10.5)

## 2015-07-10 LAB — PHOSPHORUS: Phosphorus: 2.7 mg/dL (ref 2.5–4.6)

## 2015-07-10 LAB — RETICULOCYTES
RBC.: 4.07 MIL/uL (ref 3.87–5.11)
Retic Count, Absolute: 126.2 10*3/uL (ref 19.0–186.0)
Retic Ct Pct: 3.1 % (ref 0.4–3.1)

## 2015-07-10 LAB — COMPREHENSIVE METABOLIC PANEL
ALT: 19 U/L (ref 14–54)
AST: 31 U/L (ref 15–41)
Albumin: 2.2 g/dL — ABNORMAL LOW (ref 3.5–5.0)
Alkaline Phosphatase: 89 U/L (ref 38–126)
Anion gap: 12 (ref 5–15)
BUN: 35 mg/dL — ABNORMAL HIGH (ref 6–20)
CO2: 24 mmol/L (ref 22–32)
Calcium: 8.5 mg/dL — ABNORMAL LOW (ref 8.9–10.3)
Chloride: 101 mmol/L (ref 101–111)
Creatinine, Ser: 1.52 mg/dL — ABNORMAL HIGH (ref 0.44–1.00)
GFR calc Af Amer: 40 mL/min — ABNORMAL LOW (ref >60)
GFR calc non Af Amer: 35 mL/min — ABNORMAL LOW (ref >60)
Glucose, Bld: 182 mg/dL — ABNORMAL HIGH (ref 65–99)
Potassium: 3.2 mmol/L — ABNORMAL LOW (ref 3.5–5.1)
Sodium: 137 mmol/L (ref 135–145)
Total Bilirubin: 0.4 mg/dL (ref 0.3–1.2)
Total Protein: 5.3 g/dL — ABNORMAL LOW (ref 6.5–8.1)

## 2015-07-10 LAB — MAGNESIUM: Magnesium: 2.1 mg/dL (ref 1.7–2.4)

## 2015-07-10 LAB — TROPONIN I
Troponin I: 0.08 ng/mL — ABNORMAL HIGH (ref <0.031)
Troponin I: 0.08 ng/mL — ABNORMAL HIGH (ref <0.031)
Troponin I: 0.06 ng/mL — ABNORMAL HIGH (ref <0.031)

## 2015-07-10 LAB — FERRITIN: Ferritin: 35 ng/mL (ref 11–307)

## 2015-07-10 LAB — MRSA PCR SCREENING: MRSA by PCR: POSITIVE — AB

## 2015-07-10 LAB — TSH: TSH: 7.013 u[IU]/mL — ABNORMAL HIGH (ref 0.350–4.500)

## 2015-07-10 LAB — VITAMIN B12: Vitamin B-12: 1163 pg/mL — ABNORMAL HIGH (ref 180–914)

## 2015-07-10 LAB — FOLATE: Folate: 44 ng/mL (ref >5.9)

## 2015-07-10 MED ORDER — HYDROCODONE-ACETAMINOPHEN 5-325 MG PO TABS: 1.0000 | ORAL_TABLET | ORAL | Status: DC | PRN

## 2015-07-10 MED ORDER — LEVOTHYROXINE SODIUM 50 MCG PO TABS
50.0000 ug | ORAL_TABLET | Freq: Every day | ORAL | Status: DC
  Administered 2015-07-10 – 2015-07-11 (×2): 50 ug via ORAL
  Filled 2015-07-10 (×3): qty 1

## 2015-07-10 MED ORDER — GUAIFENESIN ER 600 MG PO TB12
600.0000 mg | ORAL_TABLET | Freq: Two times a day (BID) | ORAL | Status: DC
  Administered 2015-07-10 – 2015-07-12 (×5): 600 mg via ORAL
  Filled 2015-07-10 (×5): qty 1

## 2015-07-10 MED ORDER — SODIUM CHLORIDE 0.9% FLUSH
3.0000 mL | Freq: Two times a day (BID) | INTRAVENOUS | Status: DC
  Administered 2015-07-11 – 2015-07-12 (×4): 3 mL via INTRAVENOUS

## 2015-07-10 MED ORDER — SODIUM CHLORIDE 0.9 % IV SOLN
INTRAVENOUS | Status: DC
  Administered 2015-07-10: 1.9 [IU]/h via INTRAVENOUS
  Filled 2015-07-10: qty 2.5

## 2015-07-10 MED ORDER — ASPIRIN EC 81 MG PO TBEC
81.0000 mg | DELAYED_RELEASE_TABLET | Freq: Every day | ORAL | Status: DC
  Administered 2015-07-10 – 2015-07-12 (×3): 81 mg via ORAL
  Filled 2015-07-10 (×3): qty 1

## 2015-07-10 MED ORDER — ALBUTEROL SULFATE HFA 108 (90 BASE) MCG/ACT IN AERS: 2.0000 | INHALATION_SPRAY | Freq: Four times a day (QID) | RESPIRATORY_TRACT | Status: DC | PRN

## 2015-07-10 MED ORDER — PREGABALIN 75 MG PO CAPS
150.0000 mg | ORAL_CAPSULE | Freq: Three times a day (TID) | ORAL | Status: DC
  Administered 2015-07-10 – 2015-07-12 (×8): 150 mg via ORAL
  Filled 2015-07-10 (×8): qty 2

## 2015-07-10 MED ORDER — ONDANSETRON HCL 4 MG PO TABS: 4.0000 mg | ORAL_TABLET | Freq: Four times a day (QID) | ORAL | Status: DC | PRN

## 2015-07-10 MED ORDER — ALBUTEROL SULFATE (2.5 MG/3ML) 0.083% IN NEBU: 2.5000 mg | INHALATION_SOLUTION | RESPIRATORY_TRACT | Status: DC | PRN

## 2015-07-10 MED ORDER — MUPIROCIN 2 % EX OINT
1.0000 "application " | TOPICAL_OINTMENT | Freq: Two times a day (BID) | CUTANEOUS | Status: DC
  Administered 2015-07-10 – 2015-07-12 (×6): 1 via NASAL
  Filled 2015-07-10 (×3): qty 22

## 2015-07-10 MED ORDER — CHLORHEXIDINE GLUCONATE CLOTH 2 % EX PADS
6.0000 | MEDICATED_PAD | Freq: Every day | CUTANEOUS | Status: DC
  Administered 2015-07-10 – 2015-07-12 (×3): 6 via TOPICAL

## 2015-07-10 MED ORDER — CLOPIDOGREL BISULFATE 75 MG PO TABS
75.0000 mg | ORAL_TABLET | Freq: Every day | ORAL | Status: DC
  Administered 2015-07-10 – 2015-07-12 (×3): 75 mg via ORAL
  Filled 2015-07-10 (×4): qty 1

## 2015-07-10 MED ORDER — CLONAZEPAM 0.5 MG PO TABS
1.0000 mg | ORAL_TABLET | Freq: Three times a day (TID) | ORAL | Status: DC | PRN
  Administered 2015-07-10 – 2015-07-11 (×2): 1 mg via ORAL
  Filled 2015-07-10 (×2): qty 2

## 2015-07-10 MED ORDER — SODIUM CHLORIDE 0.9 % IV SOLN
510.0000 mg | Freq: Once | INTRAVENOUS | Status: AC
  Administered 2015-07-10: 510 mg via INTRAVENOUS
  Filled 2015-07-10 (×2): qty 17

## 2015-07-10 MED ORDER — INSULIN GLARGINE 100 UNIT/ML ~~LOC~~ SOLN
15.0000 [IU] | Freq: Once | SUBCUTANEOUS | Status: AC
  Administered 2015-07-10: 15 [IU] via SUBCUTANEOUS
  Filled 2015-07-10: qty 0.15

## 2015-07-10 MED ORDER — ONDANSETRON HCL 4 MG/2ML IJ SOLN
4.0000 mg | Freq: Four times a day (QID) | INTRAMUSCULAR | Status: DC | PRN
Start: 2015-07-10 — End: 2015-07-12
  Filled 2015-07-10: qty 2

## 2015-07-10 MED ORDER — PAROXETINE HCL ER 37.5 MG PO TB24: 37.5000 mg | ORAL_TABLET | Freq: Every day | ORAL | Status: DC

## 2015-07-10 MED ORDER — ACETAMINOPHEN 325 MG PO TABS
650.0000 mg | ORAL_TABLET | Freq: Four times a day (QID) | ORAL | Status: DC | PRN
  Administered 2015-07-10 – 2015-07-11 (×2): 650 mg via ORAL
  Filled 2015-07-10 (×2): qty 2

## 2015-07-10 MED ORDER — INSULIN GLARGINE 100 UNIT/ML ~~LOC~~ SOLN
10.0000 [IU] | SUBCUTANEOUS | Status: AC
  Administered 2015-07-10: 10 [IU] via SUBCUTANEOUS
  Filled 2015-07-10: qty 0.1

## 2015-07-10 MED ORDER — ACETAMINOPHEN 650 MG RE SUPP: 650.0000 mg | Freq: Four times a day (QID) | RECTAL | Status: DC | PRN

## 2015-07-10 MED ORDER — ISOSORBIDE DINITRATE 10 MG PO TABS
10.0000 mg | ORAL_TABLET | Freq: Two times a day (BID) | ORAL | Status: DC
  Administered 2015-07-10 – 2015-07-12 (×6): 10 mg via ORAL
  Filled 2015-07-10 (×6): qty 1

## 2015-07-10 MED ORDER — DEXTROSE 50 % IV SOLN: 25.0000 mL | INTRAVENOUS | Status: DC | PRN

## 2015-07-10 MED ORDER — CARVEDILOL 12.5 MG PO TABS
12.5000 mg | ORAL_TABLET | Freq: Two times a day (BID) | ORAL | Status: DC
  Administered 2015-07-10 – 2015-07-12 (×5): 12.5 mg via ORAL
  Filled 2015-07-10 (×5): qty 1

## 2015-07-10 MED ORDER — SODIUM CHLORIDE 0.9 % IV SOLN: INTRAVENOUS | Status: AC

## 2015-07-10 MED ORDER — INSULIN REGULAR BOLUS VIA INFUSION
0.0000 [IU] | Freq: Three times a day (TID) | INTRAVENOUS | Status: DC
  Filled 2015-07-10: qty 10

## 2015-07-10 MED ORDER — IPRATROPIUM BROMIDE 0.02 % IN SOLN: 0.5000 mg | RESPIRATORY_TRACT | Status: DC | PRN

## 2015-07-10 MED ORDER — POTASSIUM CHLORIDE CRYS ER 20 MEQ PO TBCR
40.0000 meq | EXTENDED_RELEASE_TABLET | Freq: Once | ORAL | Status: AC
  Administered 2015-07-10: 40 meq via ORAL
  Filled 2015-07-10: qty 2

## 2015-07-10 MED ORDER — ENOXAPARIN SODIUM 40 MG/0.4ML ~~LOC~~ SOLN
40.0000 mg | SUBCUTANEOUS | Status: DC
Start: 2015-07-10 — End: 2015-07-12
  Administered 2015-07-10 – 2015-07-11 (×2): 40 mg via SUBCUTANEOUS
  Filled 2015-07-10 (×2): qty 0.4

## 2015-07-10 MED ORDER — INSULIN ASPART 100 UNIT/ML ~~LOC~~ SOLN
4.0000 [IU] | Freq: Three times a day (TID) | SUBCUTANEOUS | Status: DC
  Administered 2015-07-10 – 2015-07-11 (×3): 4 [IU] via SUBCUTANEOUS

## 2015-07-10 MED ORDER — PAROXETINE HCL ER 37.5 MG PO TB24
62.5000 mg | ORAL_TABLET | Freq: Every day | ORAL | Status: DC
  Administered 2015-07-10 – 2015-07-12 (×3): 62.5 mg via ORAL
  Filled 2015-07-10 (×3): qty 1

## 2015-07-10 MED ORDER — INSULIN ASPART 100 UNIT/ML ~~LOC~~ SOLN
0.0000 [IU] | Freq: Three times a day (TID) | SUBCUTANEOUS | Status: DC
  Administered 2015-07-10: 7 [IU] via SUBCUTANEOUS
  Administered 2015-07-11: 3 [IU] via SUBCUTANEOUS
  Administered 2015-07-11: 20 [IU] via SUBCUTANEOUS
  Administered 2015-07-11: 4 [IU] via SUBCUTANEOUS
  Administered 2015-07-12: 7 [IU] via SUBCUTANEOUS
  Administered 2015-07-12: 4 [IU] via SUBCUTANEOUS

## 2015-07-10 MED ORDER — INSULIN ASPART 100 UNIT/ML ~~LOC~~ SOLN
0.0000 [IU] | Freq: Every day | SUBCUTANEOUS | Status: DC
  Administered 2015-07-10: 5 [IU] via SUBCUTANEOUS
  Administered 2015-07-11: 2 [IU] via SUBCUTANEOUS

## 2015-07-10 MED ORDER — IPRATROPIUM BROMIDE 0.02 % IN SOLN
0.5000 mg | Freq: Four times a day (QID) | RESPIRATORY_TRACT | Status: DC
  Administered 2015-07-10: 0.5 mg via RESPIRATORY_TRACT
  Filled 2015-07-10: qty 2.5

## 2015-07-10 MED ORDER — PAROXETINE HCL ER 25 MG PO TB24: 25.0000 mg | ORAL_TABLET | Freq: Every day | ORAL | Status: DC

## 2015-07-10 NOTE — Progress Notes (Addendum)
Inpatient Diabetes Program Recommendations  AACE/ADA: New Consensus Statement on Inpatient Glycemic Control (2015)  Target Ranges:  Prepandial:   less than 140 mg/dL      Peak postprandial:   less than 180 mg/dL (1-2 hours)      Critically ill patients:  140 - 180 mg/dL   Spoke with patient about Diabetes and sick day management. Patient sees Dr. [name] (Endocrinologist) for DM management at home. Patient was prescribed 70/30 75 units QAM, 50 units at lunch, and 12 units at supper. Patient reports Dr. [name] told her to take 20 units TID of her 70/30 when she was feeling sick. Patient only took the 20 units once on Sunday only after her glucose meter read high.  Covered sick day guidelines with patient:  Take prescribed 20 units of 70/30 TID and if the thrid dose seems high for what her glucose is normally take her usual 12 units.  Test glucose more frequently. Every 2-4 hours depending on how high her glucose is.  If glucose remains above 240 for multiple checks call Dr. Porfirio Oar office for insulin dosing.   What to eat and drink while sick.   To transition patient off IV insulin:  Patient requirements of basal with current home dose of 70/30 is 95.9 Glucostabilizer is calculating 91 units of basal requirement. Called Dr. Ree Kida. She is ordering a weight based basal dose for a total of 25 units at this time. New orders being ordered by MD. Will wacth trends today and possibly restart her 70/30 dose tomorrow depending on her intake today.  Thanks,  Tama Headings RN, MSN, [facility] Inpatient Diabetes Coordinator Team Pager [phone] (8a-5p)

## 2015-07-10 NOTE — Progress Notes (Signed)
Utilization review completed. Elayne Gruver, RN, BSN. 

## 2015-07-10 NOTE — Progress Notes (Addendum)
[facility]                                                                              Patient Demographics  Susan [name], is a 65 y.o. female, DOB - 10-Feb-1951, [id number]  Admit date - 07/09/2015   Admitting Physician Therisa Doyne, MD  Outpatient Primary MD for the patient is Frazier Richards, PA-C  LOS - 1   Chief Complaint  Patient presents with  . Chest Pain  . Hyperglycemia      Brief summary 65 year old female with noncompliance, diabetes mellitus, hypothyroidism, chronic diastolic heart failure, presented with 4 days of nausea, vomiting, diarrhea. Patient has not been taking her insulin. Upon admission she was found to have low glucose level of 709. Patient also had mildly elevated troponin of 0.06. Patient was admitted for chest pain and gastroenteritis.  Assessment & Plan   Hyperglycemia in the setting of diabetes mellitus, type II -Patient did not take her insulin as directed -Hemoglobin A1c pending -Blood sugars upon admission were in the 700s -Patient placed on glucose stabilize her -Anion gap was normal -Patient placed on Lantus 25 units with resistant insulin sliding scale, pre-meal insulin -Continue to monitor CBGs -Diabetes coordinator consulted -Metformin, Tradjenta, home insulin regimen held  Possible gastroenteritis/ Nausea, vomiting, diarrhea -C. difficile, GI pathogen panel pending -Patient has not had any further diarrhea since admission -Nausea and vomiting seemed to have improved  CAD/Elevated troponin -Currently chest pain free -Likely secondary to the above -Peak at 0.08, Continue to cycle -If continues to rise, will consult cardiology -Continue aspirin, Coreg, Plavix, Isordil  Chronic diastolic heart failure -Echocardiogram 06/09/2015 shows EF of 55-60%, grade 2 diastolic dysfunction -Currently euvolemic -Continue to monitor intake and output, daily weights -Demadex and Zaroxolyn initially held secondary to GI  losses  Chronic kidney disease, stage III -Baseline creatinine 1.2-1.5, currently 1.52 -Continue to monitor BMP  Chronic microcytic Anemia -Baseline hemoglobin approximately 9, currently 9.1 -Anemia panel shows iron 18, ferritin 7 -Will give 1 dose of Feraheme continue to monitor CBC  Hyponatremia likely secondary to hyperglycemia -Resolved -Upon admission, sodium 127, currently 137 -Continue to monitor BMP  Metabolic acidosis secondary to hyperglycemia -Resolved continue to monitor BMP  Essential hypertension -Continue Coreg  Hypokalemia -Possibly secondary to GI losses vs insulin effect -Magnesium 2.1 -Will replace and continue to monitor BMP  Depression/ Anxiety -Continue Paxil and PRN klonopin  Hypothyroidism -Continue Synthroid  Code Status: full  Family Communication: None at bedside  Disposition Plan: Admitted  Time Spent in minutes   30 minutes  Procedures  none  Consults   none  DVT Prophylaxis  lovenox  Lab Results  Component Value Date   PLT 259 07/10/2015    Medications  Scheduled Meds: . aspirin EC  81 mg Oral Daily  . carvedilol  12.5 mg Oral BID WC  . Chlorhexidine Gluconate Cloth  6 each Topical Q0600  . clopidogrel  75 mg Oral Q breakfast  . enoxaparin (LOVENOX) injection  40 mg Subcutaneous Q24H  . guaiFENesin  600 mg Oral BID  . insulin aspart  0-20 Units Subcutaneous TID WC  . insulin aspart  0-5 Units Subcutaneous QHS  . insulin aspart  4 Units Subcutaneous TID WC  . insulin regular  0-10 Units Intravenous TID WC  . isosorbide dinitrate  10 mg Oral BID  . levothyroxine  50 mcg Oral QAC breakfast  . mupirocin ointment  1 application Nasal BID  . PARoxetine  62.5 mg Oral Daily  . pregabalin  150 mg Oral TID  . sodium chloride flush  3 mL Intravenous Q12H   Continuous Infusions: . insulin (NOVOLIN-R) infusion Stopped (07/10/15 1236)   PRN Meds:.acetaminophen **OR** acetaminophen, albuterol, clonazePAM, dextrose,  HYDROcodone-acetaminophen, ipratropium, metoCLOPramide (REGLAN) injection, ondansetron **OR** ondansetron (ZOFRAN) IV  Antibiotics   Anti-infectives    None     Subjective:   [name] seen and examined today.  Patient wishes to be. Feels her nausea and vomiting subsided. Denies further diarrhea since admission. Denies any chest pain or shortness of breath at this time.    Objective:   Filed Vitals:   07/09/15 2359 07/10/15 0105 07/10/15 0632 07/10/15 1232  BP: 155/69  127/63 136/80  Pulse: 102 99 89 82  Temp: 98 F (36.7 C)  97.8 F (36.6 C) 97.9 F (36.6 C)  TempSrc: Oral  Oral Oral  Resp: 18 18 18 18   Height: 5\' 6"  (1.676 m)     Weight: 126.871 kg (279 lb 11.2 oz)     SpO2: 94% 95% 96% 99%    Wt Readings from Last 3 Encounters:  07/09/15 126.871 kg (279 lb 11.2 oz)  07/09/15 126.554 kg (279 lb)  06/26/15 133.176 kg (293 lb 9.6 oz)     Intake/Output Summary (Last 24 hours) at 07/10/15 1315 Last data filed at 07/10/15 1244  Gross per 24 hour  Intake   1000 ml  Output    200 ml  Net    800 ml    Exam  General: Well developed, well nourished, NAD  HEENT: NCAT,mucous membranes moist.   Cardiovascular: S1 S2 auscultated, RRR, no murmurs  Respiratory: Clear to auscultation bilaterally   Abdomen: Soft, nontender, nondistended, + bowel sounds  Extremities: warm dry without cyanosis clubbing or edema  Neuro: AAOx3, nonfocal  Data Review   Micro Results Recent Results (from the past 240 hour(s))  MRSA PCR Screening     Status: Abnormal   Collection Time: 07/10/15 12:14 AM  Result Value Ref Range Status   MRSA by PCR POSITIVE (A) NEGATIVE Final    Comment:        The GeneXpert MRSA Assay (FDA approved for NASAL specimens only), is one component of a comprehensive MRSA colonization surveillance program. It is not intended to diagnose MRSA infection nor to guide or monitor treatment for MRSA infections. RESULT CALLED TO, READ BACK BY AND VERIFIED  WITH: R [place],RN @0319  07/10/15 [facility]     Radiology Reports Dg Chest 2 View  07/09/2015  CLINICAL DATA:  Chest pain for 1 day and elevated glucose EXAM: CHEST  2 VIEW COMPARISON:  06/12/2015 FINDINGS: The heart size and mediastinal contours are within normal limits. Both lungs are clear. The visualized skeletal structures are unremarkable. IMPRESSION: No active cardiopulmonary disease. Electronically Signed   By: Alcide Clever M.D.   On: 07/09/2015 16:54   Dg Chest 2 View  06/12/2015  CLINICAL DATA:  Pt sent here by "vascular lab" due to high potassium of "6." She arrives with slightly increased respirations and appears SOB. She states this started today. Only pain she reports is HA. EXAM: CHEST  2 VIEW COMPARISON:  06/07/2015 FINDINGS: The heart size and mediastinal contours are within  normal limits. Both lungs are clear. No pleural effusion or pneumothorax. The bony thorax is intact. IMPRESSION: No active cardiopulmonary disease. Electronically Signed   By: Amie Portland M.D.   On: 06/12/2015 18:02   Dg Abd 2 Views  07/09/2015  CLINICAL DATA:  65 year old female with nausea and vomiting EXAM: ABDOMEN - 2 VIEW COMPARISON:  Chest x-ray obtained earlier today FINDINGS: The bowel gas pattern is not obstructed. No evidence of free air on the lateral decubitus view. The lung bases are clear. Multilevel degenerative disease throughout the visualized thoracolumbar spine. IMPRESSION: Negative. Electronically Signed   By: Malachy Moan M.D.   On: 07/09/2015 21:17    CBC  Recent Labs Lab 07/09/15 1615 07/10/15 0441  WBC 8.3 7.5  HGB 9.8* 9.1*  HCT 32.3* 29.8*  PLT 253 259  MCV 74.4* 73.2*  MCH 22.6* 22.4*  MCHC 30.3 30.5  RDW 17.6* 17.7*    Chemistries   Recent Labs Lab 07/09/15 1615 07/10/15 0441  NA 127* 137  K 4.7 3.2*  CL 93* 101  CO2 19* 24  GLUCOSE 709* 182*  BUN 36* 35*  CREATININE 1.58* 1.52*  CALCIUM 8.7* 8.5*  MG  --  2.1  AST  --  31  ALT  --  19  ALKPHOS  --   89  BILITOT  --  0.4   ------------------------------------------------------------------------------------------------------------------ estimated creatinine clearance is 49.6 mL/min (by C-G formula based on Cr of 1.52). ------------------------------------------------------------------------------------------------------------------ No results for input(s): HGBA1C in the last 72 hours. ------------------------------------------------------------------------------------------------------------------ No results for input(s): CHOL, HDL, LDLCALC, TRIG, CHOLHDL, LDLDIRECT in the last 72 hours. ------------------------------------------------------------------------------------------------------------------  Recent Labs  07/10/15 0441  TSH 7.013*   ------------------------------------------------------------------------------------------------------------------  Recent Labs  07/10/15 0441  VITAMINB12 1163*  FOLATE 44.0  FERRITIN 35  TIBC 269  IRON 18*  RETICCTPCT 3.1    Coagulation profile No results for input(s): INR, PROTIME in the last 168 hours.  No results for input(s): DDIMER in the last 72 hours.  Cardiac Enzymes  Recent Labs Lab 07/09/15 1827 07/10/15 0005 07/10/15 0441  TROPONINI 0.06* 0.08* 0.08*   ------------------------------------------------------------------------------------------------------------------ Invalid input(s): POCBNP    Talaysia Pinheiro D.O. on 07/10/2015 at 1:15 PM  Between 7am to 7pm - Pager - [phone]  After 7pm go to [email or web address] - password [email or web address]  And look for the night coverage person covering for me after hours  [facility] Office  [phone]

## 2015-07-11 ENCOUNTER — Inpatient Hospital Stay (HOSPITAL_COMMUNITY): Payer: PPO

## 2015-07-11 DIAGNOSIS — N182 Chronic kidney disease, stage 2 (mild): Secondary | ICD-10-CM | POA: Diagnosis not present

## 2015-07-11 DIAGNOSIS — E1165 Type 2 diabetes mellitus with hyperglycemia: Principal | ICD-10-CM

## 2015-07-11 DIAGNOSIS — I779 Disorder of arteries and arterioles, unspecified: Secondary | ICD-10-CM | POA: Diagnosis not present

## 2015-07-11 DIAGNOSIS — R0600 Dyspnea, unspecified: Secondary | ICD-10-CM | POA: Diagnosis not present

## 2015-07-11 DIAGNOSIS — E1122 Type 2 diabetes mellitus with diabetic chronic kidney disease: Secondary | ICD-10-CM | POA: Diagnosis not present

## 2015-07-11 DIAGNOSIS — R739 Hyperglycemia, unspecified: Secondary | ICD-10-CM | POA: Diagnosis not present

## 2015-07-11 DIAGNOSIS — I5032 Chronic diastolic (congestive) heart failure: Secondary | ICD-10-CM | POA: Diagnosis not present

## 2015-07-11 DIAGNOSIS — N179 Acute kidney failure, unspecified: Secondary | ICD-10-CM | POA: Diagnosis not present

## 2015-07-11 DIAGNOSIS — Z794 Long term (current) use of insulin: Secondary | ICD-10-CM

## 2015-07-11 LAB — BASIC METABOLIC PANEL
Anion gap: 11 (ref 5–15)
Anion gap: 10 (ref 5–15)
BUN: 51 mg/dL — ABNORMAL HIGH (ref 6–20)
BUN: 50 mg/dL — ABNORMAL HIGH (ref 6–20)
CO2: 19 mmol/L — ABNORMAL LOW (ref 22–32)
CO2: 22 mmol/L (ref 22–32)
Calcium: 7.7 mg/dL — ABNORMAL LOW (ref 8.9–10.3)
Calcium: 8.2 mg/dL — ABNORMAL LOW (ref 8.9–10.3)
Chloride: 101 mmol/L (ref 101–111)
Chloride: 103 mmol/L (ref 101–111)
Creatinine, Ser: 2.81 mg/dL — ABNORMAL HIGH (ref 0.44–1.00)
Creatinine, Ser: 2.62 mg/dL — ABNORMAL HIGH (ref 0.44–1.00)
GFR calc Af Amer: 19 mL/min — ABNORMAL LOW (ref >60)
GFR calc Af Amer: 21 mL/min — ABNORMAL LOW (ref >60)
GFR calc non Af Amer: 16 mL/min — ABNORMAL LOW (ref >60)
GFR calc non Af Amer: 18 mL/min — ABNORMAL LOW (ref >60)
Glucose, Bld: 416 mg/dL — ABNORMAL HIGH (ref 65–99)
Glucose, Bld: 335 mg/dL — ABNORMAL HIGH (ref 65–99)
Potassium: 4.9 mmol/L (ref 3.5–5.1)
Potassium: 4.1 mmol/L (ref 3.5–5.1)
Sodium: 131 mmol/L — ABNORMAL LOW (ref 135–145)
Sodium: 135 mmol/L (ref 135–145)

## 2015-07-11 LAB — CBC
HCT: 30.8 % — ABNORMAL LOW (ref 36.0–46.0)
Hemoglobin: 9.5 g/dL — ABNORMAL LOW (ref 12.0–15.0)
MCH: 23.3 pg — ABNORMAL LOW (ref 26.0–34.0)
MCHC: 30.8 g/dL (ref 30.0–36.0)
MCV: 75.7 fL — ABNORMAL LOW (ref 78.0–100.0)
Platelets: 238 10*3/uL (ref 150–400)
RBC: 4.07 MIL/uL (ref 3.87–5.11)
RDW: 18.3 % — ABNORMAL HIGH (ref 11.5–15.5)
WBC: 7.5 10*3/uL (ref 4.0–10.5)

## 2015-07-11 LAB — GLUCOSE, CAPILLARY
Glucose-Capillary: 144 mg/dL — ABNORMAL HIGH (ref 65–99)
Glucose-Capillary: 192 mg/dL — ABNORMAL HIGH (ref 65–99)
Glucose-Capillary: 246 mg/dL — ABNORMAL HIGH (ref 65–99)
Glucose-Capillary: 367 mg/dL — ABNORMAL HIGH (ref 65–99)
Glucose-Capillary: 385 mg/dL — ABNORMAL HIGH (ref 65–99)

## 2015-07-11 LAB — HEMOGLOBIN A1C
Hgb A1c MFr Bld: 9.5 % — ABNORMAL HIGH (ref 4.8–5.6)
Mean Plasma Glucose: 226 mg/dL

## 2015-07-11 MED ORDER — LEVOTHYROXINE SODIUM 75 MCG PO TABS
75.0000 ug | ORAL_TABLET | Freq: Every day | ORAL | Status: DC
  Administered 2015-07-12: 75 ug via ORAL
  Filled 2015-07-11: qty 1

## 2015-07-11 MED ORDER — INSULIN ASPART 100 UNIT/ML ~~LOC~~ SOLN
6.0000 [IU] | Freq: Three times a day (TID) | SUBCUTANEOUS | Status: DC
  Administered 2015-07-11 – 2015-07-12 (×4): 6 [IU] via SUBCUTANEOUS

## 2015-07-11 MED ORDER — PNEUMOCOCCAL VAC POLYVALENT 25 MCG/0.5ML IJ INJ
0.5000 mL | INJECTION | INTRAMUSCULAR | Status: AC
  Administered 2015-07-12: 0.5 mL via INTRAMUSCULAR
  Filled 2015-07-11: qty 0.5

## 2015-07-11 MED ORDER — INSULIN GLARGINE 100 UNIT/ML ~~LOC~~ SOLN
35.0000 [IU] | Freq: Every day | SUBCUTANEOUS | Status: DC
  Administered 2015-07-11 – 2015-07-12 (×2): 35 [IU] via SUBCUTANEOUS
  Filled 2015-07-11 (×2): qty 0.35

## 2015-07-11 NOTE — Progress Notes (Signed)
  RD consulted for nutrition education. Pt states that she has not been taking any of her medications over the past few days due to inability to keep any food or beverages down.   Lab Results  Component Value Date   HGBA1C 9.5* 07/10/2015    RD reviewed patient's dietary recall.  Breakfast: Magazine features editor Biscuit Dinner: Wallis Bamberg, macaroni and cheese, fried pork chops (sometimes grilled chicken) PM Snack: Doritos, graham crackers  RD provided "Carbohydrate Counting for People with Diabetes" handout from the Academy of Nutrition and Dietetics. Discussed different food groups and their effects on blood sugar, emphasizing carbohydrate-containing foods. Provided list of carbohydrates and recommended serving sizes of common foods. Pt reports that she likely overeats potatoes, pasta, corn, and biscuits.   Provided examples of ways to balance meals/snacks and encouraged intake of high-fiber, whole grain complex carbohydrates. Provided "20 Ways to Eat More Fruits and Vegetables" handout from the Academy of nutrition and Dietetics.  Reviewed foods in patient's diet that are high in sodium and provided lower sodium alternatives. Discouraged intake of processed foods. Encouraged fresh fruits and vegetables as well as whole grain sources of carbohydrates to maximize fiber intake. Pt states that she just doesn't know what to cook. RD provided a variety of low sodium, low fat, low carbohydrate recipes from American Heart Associations (heart.org).   RD discussed why it is important for patient to adhere to diet recommendations, and emphasized the role of fluids, foods to avoid, and importance of weighing self daily. Teach back method used.  Expect fair compliance. Pt very appreciate. States that she will aim to eat more lean protein and decrease servings of potatoes/corn/pasta.   Body mass index is 45.84 kg/(m^2). Pt meets criteria for Morbid Obesity based on current BMI.  Current diet order is  Heart Healthy/Carb Modified, patient is consuming approximately 50% of meals at this time. Labs and medications reviewed. No further nutrition interventions warranted at this time. RD contact information provided. If additional nutrition issues arise, please re-consult RD.  Scarlette Ar RD, LDN Inpatient Clinical Dietitian Pager: [phone] After Hours Pager: [phone]

## 2015-07-11 NOTE — Progress Notes (Signed)
Inpatient Diabetes Program Recommendations  AACE/ADA: New Consensus Statement on Inpatient Glycemic Control (2015)  Target Ranges:  Prepandial:   less than 140 mg/dL      Peak postprandial:   less than 180 mg/dL (1-2 hours)      Critically ill patients:  140 - 180 mg/dL   Results for Susan, [name] (MRN [id number]) as of 07/11/2015 07:27  Ref. Range 07/10/2015 09:32 07/10/2015 10:49 07/10/2015 12:33 07/10/2015 16:37 07/10/2015 20:56 07/10/2015 23:51 07/11/2015 06:01  Glucose-Capillary Latest Ref Range: 65-99 mg/dL 187 (H) 156 (H) 93 251 (H) 334 (H) 367 (H) 385 (H)   Review of Glycemic Control  Diabetes history: DM 2 Outpatient Diabetes medications: 70/30 75 QAM, 50 units lunch, 12 units QPM Current orders for Inpatient glycemic control: Lantus 25 units given yest, Novolog Resistant + HS + Novolog 4 units meal coverage  Inpatient Diabetes Program Recommendations: Insulin - Basal: 385 this am. Consider increasing basal to 35 units. Consider givng an extra 10 units this am to make up the difference, then give 35 units tonight. Consider increasing meal coverage to Novolog 6 units TID.  Thanks,  Tama Headings RN, MSN, [facility] Inpatient Diabetes Coordinator Team Pager [phone] (8a-5p)

## 2015-07-11 NOTE — Consult Note (Signed)
   [facility] CM Inpatient Consult   07/11/2015  Susan [name] 1950/10/10 [id number] Patient evaluated for community based chronic disease management services with [facility] as a benefit of patient's [employer] Medicare Insurance.  Patient is expressing some difficulty affording her medications such as her Potassium and her Iron pills. Explain to patient that a nurse has attempted telephone contact.  She states she has been very sick lately.   Consent form signed.  She endorses her primary care provider as Dena Billet, PA-C.  She states her daughter, Susan [name], is her best contact as her husband is a Administrator.   Patient will receive post hospital discharge call and will be evaluated for monthly home visits for assessments and disease process education.  Left contact information and [name] literature at bedside. Made Inpatient Case Manager aware that [facility] following. Of note, [facility] Care Management services does not replace or interfere with any services that are arranged by inpatient case management or social work.  For additional questions or referrals please contact:   Natividad Brood, RN BSN [facility] Liaison  [phone] business mobile phone Toll free office [phone]

## 2015-07-11 NOTE — Progress Notes (Signed)
  Echocardiogram 2D Echocardiogram has been performed.  Diamond Nickel 07/11/2015, 3:14 PM

## 2015-07-11 NOTE — Progress Notes (Signed)
[facility]                                                                              Patient Demographics  Susan [name], is a 65 y.o. female, DOB - [birth year], VQQ:[id number]  Admit date - 07/09/2015   Admitting Physician Therisa Doyne, MD  Outpatient Primary MD for the patient is Frazier Richards, PA-C  LOS - 2   Chief Complaint  Patient presents with  . Chest Pain  . Hyperglycemia       Brief HPI   65 year old female with noncompliance, diabetes mellitus, hypothyroidism, chronic diastolic heart failure, presented with 4 days of nausea, vomiting, diarrhea. Patient has not been taking her insulin. Upon admission she was found to have low glucose level of 709. Patient also had mildly elevated troponin of 0.06. Patient was admitted for chest pain and gastroenteritis.   Assessment & Plan    Hyperglycemia in the setting of diabetes mellitus, type II - Blood sugars upon admission were in the 700s, patient was placed on insulin glucose drip as ordered. Anion gap normal. -Increase Lantus to 35 units, added meal coverage, continue sliding scale insulin -Diabetes coordinator consulted -Metformin, Tradjenta, home insulin regimen held  Possible gastroenteritis/ Nausea, vomiting, diarrhea -C. difficile, GI pathogen panel pending -Patient has not had any further diarrhea since admission -Nausea and vomiting seemed to have improved  Acute kidney injury on chronic kidney disease stage III - Baseline creatinine 1.2-1.5 - Creatinine elevated to 2.81 this morning, Repeated again creatinine 2.6, follow renal ultrasound him a continue gentle hydration   CAD/Elevated troponin -Currently chest pain free -Likely secondary to the above, troponins trending down -Continue aspirin, Coreg, Plavix, Isordil - 2-D echo 1/17 showed EF of 55-60% with grade 2 diastolic dysfunction  Chronic diastolic heart failure -Echocardiogram 06/09/2015 shows EF of 55-60%, grade 2  diastolic dysfunction -Currently euvolemic, follow I's and O's and daily weights -Demadex and Zaroxolyn initially held secondary to GI losses  Chronic microcytic Anemia -Baseline hemoglobin approximately 9, currently 9.1 -Anemia panel shows iron 18, ferritin 7 -Received 1 dose of Feraheme  Hyponatremia likely secondary to hyperglycemia -Resolved -Upon admission, sodium 127, currently 137 -Continue to monitor BMP  Metabolic acidosis secondary to hyperglycemia -Resolved continue to monitor BMP  Essential hypertension -Continue Coreg  Depression/ Anxiety -Continue Paxil and PRN klonopin  Hypothyroidism -Continue Synthroid, increase dose to 75 MCG daily -TSH 7.013   Code Status: Full CODE STATUS  Family Communication: Discussed in detail with the patient, all imaging results, lab results explained to the patient    Disposition Plan: Hopefully DC home in a.m. if creatinine improving  Time Spent in minutes   25 minutes  Procedures    Consults   None  DVT Prophylaxis  Lovenox   Medications  Scheduled Meds: . aspirin EC  81 mg Oral Daily  . carvedilol  12.5 mg Oral BID WC  . Chlorhexidine Gluconate Cloth  6 each Topical Q0600  . clopidogrel  75 mg Oral Q breakfast  . enoxaparin (LOVENOX) injection  40 mg Subcutaneous Q24H  . guaiFENesin  600 mg Oral BID  . insulin aspart  0-20 Units Subcutaneous  TID WC  . insulin aspart  0-5 Units Subcutaneous QHS  . insulin aspart  6 Units Subcutaneous TID WC  . insulin glargine  35 Units Subcutaneous Daily  . isosorbide dinitrate  10 mg Oral BID  . levothyroxine  50 mcg Oral QAC breakfast  . mupirocin ointment  1 application Nasal BID  . PARoxetine  62.5 mg Oral Daily  . [START ON 07/12/2015] pneumococcal 23 valent vaccine  0.5 mL Intramuscular Tomorrow-1000  . pregabalin  150 mg Oral TID  . sodium chloride flush  3 mL Intravenous Q12H   Continuous Infusions:  PRN Meds:.acetaminophen **OR** acetaminophen, albuterol,  clonazePAM, dextrose, HYDROcodone-acetaminophen, ipratropium, metoCLOPramide (REGLAN) injection, ondansetron **OR** ondansetron (ZOFRAN) IV   Antibiotics   Anti-infectives    None        Subjective:   Vernette Ryberg was seen and examined today.  Patient denies dizziness, chest pain, shortness of breath, abdominal pain, N/V/D/C, new weakness, numbess, tingling. No acute events overnight.    Objective:   Filed Vitals:   07/10/15 1232 07/10/15 1952 07/11/15 0400 07/11/15 1119  BP: 136/80 157/67 159/89 138/78  Pulse: 82 86 92 74  Temp: 97.9 F (36.6 C) 98.1 F (36.7 C) 98.9 F (37.2 C) 98.1 F (36.7 C)  TempSrc: Oral Oral Oral Oral  Resp: 18 18 18 16   Height:      Weight:   128.776 kg (283 lb 14.4 oz)   SpO2: 99% 98% 96% 96%    Intake/Output Summary (Last 24 hours) at 07/11/15 1250 Last data filed at 07/11/15 0948  Gross per 24 hour  Intake    755 ml  Output    250 ml  Net    505 ml     Wt Readings from Last 3 Encounters:  07/11/15 128.776 kg (283 lb 14.4 oz)  07/09/15 126.554 kg (279 lb)  06/26/15 133.176 kg (293 lb 9.6 oz)     Exam  General: Alert and oriented x 3, NAD  HEENT:  PERRLA, EOMI, Anicteric Sclera, mucous membranes moist.   Neck: Supple, no JVD, no masses  CVS: S1 S2 auscultated, no rubs, murmurs or gallops. Regular rate and rhythm.  Respiratory: Clear to auscultation bilaterally, no wheezing, rales or rhonchi  Abdomen: Soft, nontender, nondistended, + bowel sounds  Ext: no cyanosis clubbing or edema  Neuro: AAOx3, Cr N's II- XII. Strength 5/5 upper and lower extremities bilaterally  Skin: No rashes  Psych: Normal affect and demeanor, alert and oriented x3    Data Review   Micro Results Recent Results (from the past 240 hour(s))  MRSA PCR Screening     Status: Abnormal   Collection Time: 07/10/15 12:14 AM  Result Value Ref Range Status   MRSA by PCR POSITIVE (A) NEGATIVE Final    Comment:        The GeneXpert MRSA Assay  (FDA approved for NASAL specimens only), is one component of a comprehensive MRSA colonization surveillance program. It is not intended to diagnose MRSA infection nor to guide or monitor treatment for MRSA infections. RESULT CALLED TO, READ BACK BY AND VERIFIED WITH: R [place],RN @0319  07/10/15 [facility]     Radiology Reports Dg Chest 2 View  07/09/2015  CLINICAL DATA:  Chest pain for 1 day and elevated glucose EXAM: CHEST  2 VIEW COMPARISON:  06/12/2015 FINDINGS: The heart size and mediastinal contours are within normal limits. Both lungs are clear. The visualized skeletal structures are unremarkable. IMPRESSION: No active cardiopulmonary disease. Electronically Signed   By: Loraine Leriche  Lukens M.D.   On: 07/09/2015 16:54   Dg Chest 2 View  06/12/2015  CLINICAL DATA:  Pt sent here by "vascular lab" due to high potassium of "6." She arrives with slightly increased respirations and appears SOB. She states this started today. Only pain she reports is HA. EXAM: CHEST  2 VIEW COMPARISON:  06/07/2015 FINDINGS: The heart size and mediastinal contours are within normal limits. Both lungs are clear. No pleural effusion or pneumothorax. The bony thorax is intact. IMPRESSION: No active cardiopulmonary disease. Electronically Signed   By: Amie Portland M.D.   On: 06/12/2015 18:02   Dg Abd 2 Views  07/09/2015  CLINICAL DATA:  65 year old female with nausea and vomiting EXAM: ABDOMEN - 2 VIEW COMPARISON:  Chest x-ray obtained earlier today FINDINGS: The bowel gas pattern is not obstructed. No evidence of free air on the lateral decubitus view. The lung bases are clear. Multilevel degenerative disease throughout the visualized thoracolumbar spine. IMPRESSION: Negative. Electronically Signed   By: Malachy Moan M.D.   On: 07/09/2015 21:17    CBC  Recent Labs Lab 07/09/15 1615 07/10/15 0441 07/11/15 0455  WBC 8.3 7.5 7.5  HGB 9.8* 9.1* 9.5*  HCT 32.3* 29.8* 30.8*  PLT 253 259 238  MCV 74.4* 73.2*  75.7*  MCH 22.6* 22.4* 23.3*  MCHC 30.3 30.5 30.8  RDW 17.6* 17.7* 18.3*    Chemistries   Recent Labs Lab 07/09/15 1615 07/10/15 0441 07/11/15 0455 07/11/15 0907  NA 127* 137 131* 135  K 4.7 3.2* 4.9 4.1  CL 93* 101 101 103  CO2 19* 24 19* 22  GLUCOSE 709* 182* 416* 335*  BUN 36* 35* 51* 50*  CREATININE 1.58* 1.52* 2.81* 2.62*  CALCIUM 8.7* 8.5* 7.7* 8.2*  MG  --  2.1  --   --   AST  --  31  --   --   ALT  --  19  --   --   ALKPHOS  --  89  --   --   BILITOT  --  0.4  --   --    ------------------------------------------------------------------------------------------------------------------ estimated creatinine clearance is 29 mL/min (by C-G formula based on Cr of 2.62). ------------------------------------------------------------------------------------------------------------------  Recent Labs  07/10/15 0441  HGBA1C 9.5*   ------------------------------------------------------------------------------------------------------------------ No results for input(s): CHOL, HDL, LDLCALC, TRIG, CHOLHDL, LDLDIRECT in the last 72 hours. ------------------------------------------------------------------------------------------------------------------  Recent Labs  07/10/15 0441  TSH 7.013*   ------------------------------------------------------------------------------------------------------------------  Recent Labs  07/10/15 0441  VITAMINB12 1163*  FOLATE 44.0  FERRITIN 35  TIBC 269  IRON 18*  RETICCTPCT 3.1    Coagulation profile No results for input(s): INR, PROTIME in the last 168 hours.  No results for input(s): DDIMER in the last 72 hours.  Cardiac Enzymes  Recent Labs Lab 07/10/15 0005 07/10/15 0441 07/10/15 1244  TROPONINI 0.08* 0.08* 0.06*   ------------------------------------------------------------------------------------------------------------------ Invalid input(s): POCBNP   Recent Labs  07/10/15 1233 07/10/15 1637 07/10/15 2056  07/10/15 2351 07/11/15 0601 07/11/15 1116  GLUCAP 93 251* 334* 367* 385* 192*     Kaylenn Civil M.D. [facility] 07/11/2015, 12:50 PM  Pager: [phone] Between 7am to 7pm - call Pager - [phone]  After 7pm go to [email or web address] - password [email or web address]  Call night coverage person covering after 7pm

## 2015-07-12 ENCOUNTER — Other Ambulatory Visit: Payer: Self-pay

## 2015-07-12 DIAGNOSIS — I779 Disorder of arteries and arterioles, unspecified: Secondary | ICD-10-CM | POA: Diagnosis not present

## 2015-07-12 DIAGNOSIS — N179 Acute kidney failure, unspecified: Secondary | ICD-10-CM | POA: Diagnosis not present

## 2015-07-12 DIAGNOSIS — F419 Anxiety disorder, unspecified: Secondary | ICD-10-CM | POA: Diagnosis not present

## 2015-07-12 DIAGNOSIS — Z87891 Personal history of nicotine dependence: Secondary | ICD-10-CM | POA: Diagnosis not present

## 2015-07-12 DIAGNOSIS — Z885 Allergy status to narcotic agent status: Secondary | ICD-10-CM | POA: Diagnosis not present

## 2015-07-12 DIAGNOSIS — I25119 Atherosclerotic heart disease of native coronary artery with unspecified angina pectoris: Secondary | ICD-10-CM | POA: Diagnosis not present

## 2015-07-12 DIAGNOSIS — R739 Hyperglycemia, unspecified: Secondary | ICD-10-CM | POA: Diagnosis not present

## 2015-07-12 DIAGNOSIS — I10 Essential (primary) hypertension: Secondary | ICD-10-CM

## 2015-07-12 DIAGNOSIS — F329 Major depressive disorder, single episode, unspecified: Secondary | ICD-10-CM | POA: Diagnosis not present

## 2015-07-12 DIAGNOSIS — D509 Iron deficiency anemia, unspecified: Secondary | ICD-10-CM | POA: Diagnosis not present

## 2015-07-12 DIAGNOSIS — Z6841 Body Mass Index (BMI) 40.0 and over, adult: Secondary | ICD-10-CM | POA: Diagnosis not present

## 2015-07-12 DIAGNOSIS — E039 Hypothyroidism, unspecified: Secondary | ICD-10-CM | POA: Diagnosis not present

## 2015-07-12 DIAGNOSIS — I130 Hypertensive heart and chronic kidney disease with heart failure and stage 1 through stage 4 chronic kidney disease, or unspecified chronic kidney disease: Secondary | ICD-10-CM | POA: Diagnosis not present

## 2015-07-12 DIAGNOSIS — E1165 Type 2 diabetes mellitus with hyperglycemia: Secondary | ICD-10-CM | POA: Diagnosis not present

## 2015-07-12 DIAGNOSIS — I4510 Unspecified right bundle-branch block: Secondary | ICD-10-CM | POA: Diagnosis not present

## 2015-07-12 DIAGNOSIS — I248 Other forms of acute ischemic heart disease: Secondary | ICD-10-CM | POA: Diagnosis not present

## 2015-07-12 DIAGNOSIS — E1122 Type 2 diabetes mellitus with diabetic chronic kidney disease: Secondary | ICD-10-CM | POA: Diagnosis not present

## 2015-07-12 DIAGNOSIS — N182 Chronic kidney disease, stage 2 (mild): Secondary | ICD-10-CM | POA: Diagnosis not present

## 2015-07-12 DIAGNOSIS — E860 Dehydration: Secondary | ICD-10-CM | POA: Diagnosis not present

## 2015-07-12 DIAGNOSIS — Z7982 Long term (current) use of aspirin: Secondary | ICD-10-CM | POA: Diagnosis not present

## 2015-07-12 DIAGNOSIS — K529 Noninfective gastroenteritis and colitis, unspecified: Secondary | ICD-10-CM

## 2015-07-12 DIAGNOSIS — N183 Chronic kidney disease, stage 3 (moderate): Secondary | ICD-10-CM | POA: Diagnosis not present

## 2015-07-12 DIAGNOSIS — E872 Acidosis: Secondary | ICD-10-CM | POA: Diagnosis not present

## 2015-07-12 DIAGNOSIS — Z23 Encounter for immunization: Secondary | ICD-10-CM | POA: Diagnosis not present

## 2015-07-12 DIAGNOSIS — E871 Hypo-osmolality and hyponatremia: Secondary | ICD-10-CM | POA: Diagnosis not present

## 2015-07-12 DIAGNOSIS — E785 Hyperlipidemia, unspecified: Secondary | ICD-10-CM | POA: Diagnosis not present

## 2015-07-12 DIAGNOSIS — I252 Old myocardial infarction: Secondary | ICD-10-CM | POA: Diagnosis not present

## 2015-07-12 DIAGNOSIS — B9620 Unspecified Escherichia coli [E. coli] as the cause of diseases classified elsewhere: Secondary | ICD-10-CM | POA: Diagnosis not present

## 2015-07-12 DIAGNOSIS — E1142 Type 2 diabetes mellitus with diabetic polyneuropathy: Secondary | ICD-10-CM | POA: Diagnosis not present

## 2015-07-12 DIAGNOSIS — Z7902 Long term (current) use of antithrombotics/antiplatelets: Secondary | ICD-10-CM | POA: Diagnosis not present

## 2015-07-12 DIAGNOSIS — Z955 Presence of coronary angioplasty implant and graft: Secondary | ICD-10-CM | POA: Diagnosis not present

## 2015-07-12 DIAGNOSIS — R1110 Vomiting, unspecified: Secondary | ICD-10-CM | POA: Diagnosis not present

## 2015-07-12 DIAGNOSIS — Z8673 Personal history of transient ischemic attack (TIA), and cerebral infarction without residual deficits: Secondary | ICD-10-CM | POA: Diagnosis not present

## 2015-07-12 DIAGNOSIS — Z794 Long term (current) use of insulin: Secondary | ICD-10-CM | POA: Diagnosis not present

## 2015-07-12 DIAGNOSIS — M1990 Unspecified osteoarthritis, unspecified site: Secondary | ICD-10-CM | POA: Diagnosis not present

## 2015-07-12 DIAGNOSIS — E876 Hypokalemia: Secondary | ICD-10-CM | POA: Diagnosis not present

## 2015-07-12 DIAGNOSIS — Z7984 Long term (current) use of oral hypoglycemic drugs: Secondary | ICD-10-CM | POA: Diagnosis not present

## 2015-07-12 DIAGNOSIS — N390 Urinary tract infection, site not specified: Secondary | ICD-10-CM | POA: Diagnosis not present

## 2015-07-12 DIAGNOSIS — I5032 Chronic diastolic (congestive) heart failure: Secondary | ICD-10-CM | POA: Diagnosis not present

## 2015-07-12 LAB — GLUCOSE, CAPILLARY
Glucose-Capillary: 230 mg/dL — ABNORMAL HIGH (ref 65–99)
Glucose-Capillary: 213 mg/dL — ABNORMAL HIGH (ref 65–99)
Glucose-Capillary: 172 mg/dL — ABNORMAL HIGH (ref 65–99)
Glucose-Capillary: 135 mg/dL — ABNORMAL HIGH (ref 65–99)

## 2015-07-12 LAB — BASIC METABOLIC PANEL
Anion gap: 12 (ref 5–15)
BUN: 53 mg/dL — ABNORMAL HIGH (ref 6–20)
CO2: 21 mmol/L — ABNORMAL LOW (ref 22–32)
Calcium: 8.5 mg/dL — ABNORMAL LOW (ref 8.9–10.3)
Chloride: 104 mmol/L (ref 101–111)
Creatinine, Ser: 1.62 mg/dL — ABNORMAL HIGH (ref 0.44–1.00)
GFR calc Af Amer: 37 mL/min — ABNORMAL LOW (ref >60)
GFR calc non Af Amer: 32 mL/min — ABNORMAL LOW (ref >60)
Glucose, Bld: 237 mg/dL — ABNORMAL HIGH (ref 65–99)
Potassium: 4.4 mmol/L (ref 3.5–5.1)
Sodium: 137 mmol/L (ref 135–145)

## 2015-07-12 LAB — URINE CULTURE: Culture: >=100000

## 2015-07-12 LAB — CBC
HCT: 35.4 % — ABNORMAL LOW (ref 36.0–46.0)
Hemoglobin: 10.7 g/dL — ABNORMAL LOW (ref 12.0–15.0)
MCH: 22.7 pg — ABNORMAL LOW (ref 26.0–34.0)
MCHC: 30.2 g/dL (ref 30.0–36.0)
MCV: 75.2 fL — ABNORMAL LOW (ref 78.0–100.0)
Platelets: 253 10*3/uL (ref 150–400)
RBC: 4.71 MIL/uL (ref 3.87–5.11)
RDW: 18.6 % — ABNORMAL HIGH (ref 11.5–15.5)
WBC: 6.5 10*3/uL (ref 4.0–10.5)

## 2015-07-12 MED ORDER — AMOXICILLIN 500 MG PO CAPS
500.0000 mg | ORAL_CAPSULE | Freq: Three times a day (TID) | ORAL | Status: DC
  Administered 2015-07-12: 500 mg via ORAL
  Filled 2015-07-12: qty 1

## 2015-07-12 MED ORDER — AMOXICILLIN 500 MG PO CAPS: 500.0000 mg | ORAL_CAPSULE | Freq: Three times a day (TID) | ORAL | Status: DC

## 2015-07-12 MED ORDER — LEVOTHYROXINE SODIUM 75 MCG PO TABS: 75.0000 ug | ORAL_TABLET | Freq: Every day | ORAL | Status: DC

## 2015-07-12 MED ORDER — PROMETHAZINE HCL 12.5 MG PO TABS: 12.5000 mg | ORAL_TABLET | Freq: Four times a day (QID) | ORAL | Status: DC | PRN

## 2015-07-12 NOTE — Progress Notes (Signed)
Orders received for pt discharge.  Discharge summary printed and reviewed with pt.  Explained medication regimen, and pt had no further questions at this time.  IV removed and site remains clean, dry, intact.  Telemetry removed.  Pt in stable condition and awaiting transport. 

## 2015-07-12 NOTE — Progress Notes (Addendum)
Pt is asymptomatic for C-diff precautions.  C-diff orders have expired.  Pt is being discharged.  Enteric precautions have been discontinued per verbal order attending physician.

## 2015-07-12 NOTE — Progress Notes (Signed)
Orders placed per Dr. Tana Coast to discontinue cardiac monitoring.

## 2015-07-12 NOTE — Progress Notes (Signed)
Patient refused to have bed alarm. Patient understands that our policy for bed alarm is for safety due to her having a fall in the past six months. Patient understands that staff will continue to hourly round to continue to provide safety and check for the need of assistance at that time. Will continue to monitor patient to end of shift.

## 2015-07-12 NOTE — Plan of Care (Signed)
Problem: Tissue Perfusion: Goal: Risk factors for ineffective tissue perfusion will decrease Outcome: Progressing Gets short of breath walking from the bed to the bathroom  Problem: Fluid Volume: Goal: Ability to maintain a balanced intake and output will improve Outcome: Progressing Still needs teaching reinforcements when knowing about foods that will cause blood sugar to increase.

## 2015-07-12 NOTE — Discharge Summary (Signed)
Physician Discharge Summary   Patient ID: Susan [name] MRN: [id number] DOB/AGE: 1951/01/20 65 y.o.  Admit date: 07/09/2015 Discharge date: 07/12/2015  Primary Care Physician:  Frazier Richards, PA-C  Discharge Diagnoses:    . Hyperglycemia . Gastroenteritis   Escherichia coli UTI  . Essential hypertension . Diabetes mellitus type II, uncontrolled (HCC) . acute kidney injury CKD (chronic kidney disease), stage II . Chronic diastolic CHF (congestive heart failure) (HCC) . Carotid artery disease (HCC) . hypothyroidism  Consults:  None  Recommendations for Outpatient Follow-up:  1. Please repeat CBC/BMET at next visit 2. Please follow hemoglobin A1c, TSH in 4 weeks  3.  please note currently losartan and metolazone are on hold due to acute on chronic renal insufficiency, please check BMET and restart medications as needed    DIET: Carb modified diet   Allergies:   Allergies  Allergen Reactions  . Codeine Nausea And Vomiting     DISCHARGE MEDICATIONS: Current Discharge Medication List    START taking these medications   Details  amoxicillin (AMOXIL) 500 MG capsule Take 1 capsule (500 mg total) by mouth 3 (three) times daily. X 3 days Qty: 9 capsule, Refills: 0    promethazine (PHENERGAN) 12.5 MG tablet Take 1 tablet (12.5 mg total) by mouth every 6 (six) hours as needed for nausea or vomiting. Qty: 30 tablet, Refills: 0      CONTINUE these medications which have CHANGED   Details  levothyroxine (SYNTHROID, LEVOTHROID) 75 MCG tablet Take 1 tablet (75 mcg total) by mouth daily before breakfast. Qty: 30 tablet, Refills: 3      CONTINUE these medications which have NOT CHANGED   Details  albuterol (PROVENTIL HFA;VENTOLIN HFA) 108 (90 BASE) MCG/ACT inhaler Inhale 2 puffs into the lungs every 6 (six) hours as needed for wheezing or shortness of breath.    aspirin EC 81 MG tablet Take 81 mg by mouth daily.    carvedilol (COREG) 12.5 MG tablet Take 12.5 mg by mouth 2  (two) times daily with a meal.    clonazePAM (KLONOPIN) 1 MG tablet Take 1 mg by mouth 3 (three) times daily as needed for anxiety.    clopidogrel (PLAVIX) 75 MG tablet Take 1 tablet (75 mg total) by mouth daily with breakfast. Qty: 90 tablet, Refills: 3    Fe Cbn-Fe Gluc-FA-B12-C-DSS (FERRALET 90) 90-1 MG TABS Take 1 tablet by mouth daily. Refills: 12    insulin NPH-regular Human (NOVOLIN 70/30) (70-30) 100 UNIT/ML injection Inject 12-75 Units into the skin 3 (three) times daily with meals. 75 units in the morning, and 50 with lunch and 12 in the evening    isosorbide dinitrate (ISORDIL) 10 MG tablet TAKE 1 TABLET (10 MG TOTAL) BY MOUTH 2 (TWO) TIMES DAILY. Qty: 60 tablet, Refills: 10    metFORMIN (GLUMETZA) 500 MG (MOD) 24 hr tablet Take 500 mg by mouth 2 (two) times daily with a meal.    nitroGLYCERIN (NITROSTAT) 0.4 MG SL tablet Place 1 tablet (0.4 mg total) under the tongue every 5 (five) minutes as needed for chest pain. Qty: 25 tablet, Refills: 3   Associated Diagnoses: Shortness of breath    !! PARoxetine (PAXIL-CR) 25 MG 24 hr tablet TAKE 1 TABLET (25 MG TOTAL) BY MOUTH DAILY. TAKEN WITH 37.5 FOR A TOTAL = 62.5MG  Qty: 30 tablet, Refills: 5    !! PARoxetine (PAXIL-CR) 37.5 MG 24 hr tablet TAKE 1 TABLET (37.5 MG TOTAL) BY MOUTH DAILY. TAKEN WITH 25MG  FOR A TOTAL = 62.5 MG  Qty: 30 tablet, Refills: 5    pregabalin (LYRICA) 150 MG capsule Take 150 mg by mouth 3 (three) times daily.     torsemide (DEMADEX) 20 MG tablet Take 60 mg (3 tablets) in the morning and 40 mg (2tblet) in the afternoon Qty: 150 tablet, Refills: 2    TRADJENTA 5 MG TABS tablet Take 5 mg by mouth daily. Refills: 6     !! - Potential duplicate medications found. Please discuss with provider.    STOP taking these medications     losartan (COZAAR) 50 MG tablet      metolazone (ZAROXOLYN) 2.5 MG tablet      acyclovir (ZOVIRAX) 400 MG tablet          Brief H and P: For complete details please refer  to admission H and P, but in brief 65 year old female with noncompliance, diabetes mellitus, hypothyroidism, chronic diastolic heart failure, presented with 4 days of nausea, vomiting, diarrhea. Patient has not been taking her insulin. Upon admission she was found to have glucose level of 709. Patient also had mildly elevated troponin of 0.06. Patient was admitted for chest pain and gastroenteritis.  Hospital Course:  Hyperosmolar Hyperglycemia in the setting of diabetes mellitus, type II - Blood sugars upon admission were in the 700s, patient was placed on insulin glucose drip as ordered. Anion gap normal. Patient was transitioned to subcutaneous insulin with Lantus and meal coverage and sliding scale insulin. Diabetic coordinator consult was obtained and patient was provided with all the teaching regarding Insulin use and meals. Patient then reported that she had stopped using the insulin  at home as she was feeling sick which likely led to hyperosmolar hyper glycemia - Patient requested to continue with her own insulin regimen at home, insulin 70/30 with meals. She will continue metformin and Tradjenta. She was strongly recommended to check her blood sugars with meals and bedtime and keep a log. Further insulin regimen will be adjusted by PCP.   Possible gastroenteritis/ Nausea, vomiting, diarrhea:  Symptoms have been resolved, patient had no further bowel movement during the hospitalization. She is tolerating diet without any difficulty.  C. difficile and a GI pathogen panel were ordered however could not be collected as patient did not have any further diarrhea.    Acute kidney injury on chronic kidney disease stage III: Likely due to diarrhea on admission, gastroenteritis  - Baseline creatinine 1.2-1.5 - creatinine trended up to 2.8. Renal ultrasound was obtained which showed no   Hydronephrosis. Creatinine trending down to 1.6 at the time of discharge. Patient will continue to hold losartan and  metolazone. Please check BMET at the time of follow-up appointment   CAD/Elevated troponin -Currently chest pain free, Likely secondary to the above, troponins trending down -Continue aspirin, Coreg, Plavix, Isordil - 2-D echo showed EF of 50-55% with grade 1 diastolic dysfunction.   Chronic diastolic heart failure -Echocardiogram 06/09/2015 shows EF of 55-60%, grade 2 diastolic dysfunction -Demadex and Zaroxolyn initially held secondary to GI losses, -  patient can restart Demadex however Zaroxolyn and losartan still on hold due to mild renal insufficiency.  Chronic microcytic Anemia -Baseline hemoglobin approximately 9, currently 9.1 -Anemia panel shows iron 18, ferritin 7 -Received 1 dose of Feraheme. Outpatient follow-up with PCP and further workup.   Hyponatremia likely secondary to hyperglycemia -Resolved -Upon admission, sodium 127, currently 137  Metabolic acidosis secondary to hyperglycemia -Resolved continue to monitor BMP  Essential hypertension -Continue Coreg  Depression/ Anxiety -Continue Paxil and PRN klonopin  Hypothyroidism -Continue Synthroid, increase dose to 75 MCG daily -TSH 7.013, please check TSH in 4 weeks.     Day of Discharge BP 133/74 mmHg  Pulse 77  Temp(Src) 98.2 F (36.8 C) (Oral)  Resp 18  Ht 5\' 6"  (1.676 m)  Wt 128.595 kg (283 lb 8 oz)  BMI 45.78 kg/m2  SpO2 95%  Physical Exam: General: Alert and awake oriented x3 not in any acute distress. HEENT: anicteric sclera, pupils reactive to light and accommodation CVS: S1-S2 clear no murmur rubs or gallops Chest: clear to auscultation bilaterally, no wheezing rales or rhonchi Abdomen: soft nontender, nondistended, normal bowel sounds Extremities: no cyanosis, clubbing or edema noted bilaterally Neuro: Cranial nerves II-XII intact, no focal neurological deficits   The results of significant diagnostics from this hospitalization (including imaging, microbiology, ancillary and laboratory)  are listed below for reference.    LAB RESULTS: Basic Metabolic Panel:  Recent Labs Lab 07/10/15 0441  07/11/15 0907 07/12/15 0534  NA 137  < > 135 137  K 3.2*  < > 4.1 4.4  CL 101  < > 103 104  CO2 24  < > 22 21*  GLUCOSE 182*  < > 335* 237*  BUN 35*  < > 50* 53*  CREATININE 1.52*  < > 2.62* 1.62*  CALCIUM 8.5*  < > 8.2* 8.5*  MG 2.1  --   --   --   PHOS 2.7  --   --   --   < > = values in this interval not displayed. Liver Function Tests:  Recent Labs Lab 07/10/15 0441  AST 31  ALT 19  ALKPHOS 89  BILITOT 0.4  PROT 5.3*  ALBUMIN 2.2*   No results for input(s): LIPASE, AMYLASE in the last 168 hours. No results for input(s): AMMONIA in the last 168 hours. CBC:  Recent Labs Lab 07/11/15 0455 07/12/15 0534  WBC 7.5 6.5  HGB 9.5* 10.7*  HCT 30.8* 35.4*  MCV 75.7* 75.2*  PLT 238 253   Cardiac Enzymes:  Recent Labs Lab 07/10/15 0441 07/10/15 1244  TROPONINI 0.08* 0.06*   BNP: Invalid input(s): POCBNP CBG:  Recent Labs Lab 07/12/15 0541 07/12/15 1125  GLUCAP 213* 172*    Significant Diagnostic Studies:  Dg Chest 2 View  07/09/2015  CLINICAL DATA:  Chest pain for 1 day and elevated glucose EXAM: CHEST  2 VIEW COMPARISON:  06/12/2015 FINDINGS: The heart size and mediastinal contours are within normal limits. Both lungs are clear. The visualized skeletal structures are unremarkable. IMPRESSION: No active cardiopulmonary disease. Electronically Signed   By: Alcide Clever M.D.   On: 07/09/2015 16:54   Dg Abd 2 Views  07/09/2015  CLINICAL DATA:  65 year old female with nausea and vomiting EXAM: ABDOMEN - 2 VIEW COMPARISON:  Chest x-ray obtained earlier today FINDINGS: The bowel gas pattern is not obstructed. No evidence of free air on the lateral decubitus view. The lung bases are clear. Multilevel degenerative disease throughout the visualized thoracolumbar spine. IMPRESSION: Negative. Electronically Signed   By: Malachy Moan M.D.   On: 07/09/2015 21:17     2D ECHO: Study Conclusions  - Left ventricle: The cavity size was normal. There was moderate concentric hypertrophy. Systolic function was normal. The estimated ejection fraction was in the range of 50% to 55%. Wall motion was normal; there were no regional wall motion abnormalities. Doppler parameters are consistent with abnormal left ventricular relaxation (grade 1 diastolic dysfunction). Doppler parameters are consistent with elevated ventricular end-diastolic filling  pressure. - Aortic valve: Trileaflet; normal thickness leaflets. - Aortic root: The aortic root was normal in size. - Mitral valve: Structurally normal valve. There was mild regurgitation. - Left atrium: The atrium was mildly dilated. - Right ventricle: The cavity size was normal. Wall thickness was normal. Systolic function was normal. - Tricuspid valve: There was moderate regurgitation. - Pulmonary arteries: Systolic pressure was mildly increased. PA peak pressure: 41 mm Hg (S). - Pericardium, extracardiac: A trivial pericardial effusion was identified posterior to the heart.  Disposition and Follow-up: Discharge Instructions    (HEART FAILURE PATIENTS) Call MD:  Anytime you have any of the following symptoms: 1) 3 pound weight gain in 24 hours or 5 pounds in 1 week 2) shortness of breath, with or without a dry hacking cough 3) swelling in the hands, feet or stomach 4) if you have to sleep on extra pillows at night in order to breathe.    Complete by:  As directed      AMB Referral to [facility] Care Management    Complete by:  As directed   Reason for consult:  Post hospital monitoring  Expected date of contact:  1-3 days (reserved for hospital discharges)  Please assign to community nurse for transition of care calls and assess for home visits.  Patient was being contacted previously by Crystal [facility].  Health Coach. Patient states her best contact number is [phone].    Questions please call:   [name], RN BSN CCM [facility]  [phone] business mobile phone Toll free office [phone]     Diet Carb Modified    Complete by:  As directed      Diet Carb Modified    Complete by:  As directed      Discharge instructions    Complete by:  As directed   HOLD  Hold metolazone and losartan     Discharge instructions    Complete by:  As directed   It is VERY IMPORTANT that you follow up with a PCP on a regular basis.  Check your blood glucoses before each meal and at bedtime and maintain a log of your readings.  Bring this log with you when you follow up with your PCP so that he or she can adjust your insulin at your follow up visit.     Increase activity slowly    Complete by:  As directed      Increase activity slowly    Complete by:  As directed             DISPOSITION: home    DISCHARGE FOLLOW-UP Follow-up Information    Follow up with [facility] BETH, PA-C. Schedule an appointment as soon as possible for a visit in 10 days.   Specialty:  Physician Assistant   Why:  for hospital follow-up, obtain labs BMET   Contact information:   [address] [place] [address] [phone]       Follow up with Quintella Reichert, MD. Schedule an appointment as soon as possible for a visit in 2 weeks.   Specialty:  Cardiology   Why:  for hospital follow-up   Contact information:   [address] [place] [address] [phone]        Time spent on Discharge:   Signed:   Johneisha Broaden M.D. [facility] 07/12/2015, 12:04 PM Pager: [phone]

## 2015-07-12 NOTE — Progress Notes (Signed)
Pt educated on importance of placing and maintaining bed/chair alarm.  Pt continues to refuse bed/chair alarm.

## 2015-07-13 ENCOUNTER — Other Ambulatory Visit: Payer: Self-pay | Admitting: *Deleted

## 2015-07-13 ENCOUNTER — Telehealth: Payer: Self-pay | Admitting: *Deleted

## 2015-07-13 ENCOUNTER — Other Ambulatory Visit: Payer: Self-pay

## 2015-07-13 NOTE — Patient Outreach (Signed)
[facility]) Care Management  07/13/2015  Huntlee Winsett 08/10/1950 [id number]   Case closed to [facility] and transferred to [place] CM for Transtion of care services.   Mariann Laster, RN, BSN, [facility], CCM  [facility] [employer] Management Coordinator [phone] Direct [phone] Cell [phone] Office [phone] Fax

## 2015-07-13 NOTE — Telephone Encounter (Signed)
Received fax from [facility] care team stating that patient has been admitted to acute care hospital  Hospital: [facility] date: 07/09/15  Dx: R11.10-Vomiting unspecified  Admitting physician: Toy Baker, MD  Pending authorization: [phone]

## 2015-07-13 NOTE — Patient Outreach (Signed)
Transition of care call (week 1, discharged 2/16).  Called pt's home. Person answering the phone  reports pt just came home from the hospital, resting,reports not a good time, identified herself as pt's daughter.   RN CM requested a call back from pt today to which was told to call 2/20.    Plan to f/u again telephonically on 2/20  as part of transition of care.      Zara Chess.   [place] Care Management  [phone]

## 2015-07-16 ENCOUNTER — Other Ambulatory Visit: Payer: Self-pay | Admitting: *Deleted

## 2015-07-16 ENCOUNTER — Encounter: Payer: Self-pay | Admitting: *Deleted

## 2015-07-16 NOTE — Patient Outreach (Signed)
Transition of care call (week 1, second attempt, discharged 2/16).   Spoke with pt, HIPPA verified.  Pt reports weak since hospital discharge.  Pt reports to f/u with Dena Billet PA 2/23, heart and vascular 2/28.  Pt reports had cellulitis to both legs/are wrapped/ healed, to f/u at [place] center 2/27.  Pt reports also has more swelling in feet/ankles, a little sob.  Pt reports did not weigh today yet, recently got up.   RN CM discussed importance of weighing daily,record, call MD for a weight gain of 3 lbs in a day, 5 lbs in a week.   Reviewed with pt discharge medications, which included stopping  Losartan, Metolazone, Zovirax to which pt voiced understanding.   As requested and discussed, pt to weigh this am and return phone call to RN CM.   Zara Chess.   [facility] Care Management  [phone]

## 2015-07-16 NOTE — Patient Outreach (Addendum)
Follow up call (transition of care):  Spoke with pt to inquire about current blood sugars to which pt reports has not checked it yet today, yesterday ran in 300's, feel better when in the 200's,not 100.    Pt reports did weigh, result was 286 lbs, in the hospital thinks it was  276 lbs/running in the 270's.   Pt reports does have sob when lying down, uses 2 pillows to prop herself and feels better.   RN CM discussed with pt to call MD because of weight gain, swelling, some sob to which pt reports she would.   Also discussed with pt to call RN CM tomorrow to discuss transportation options if continue to be weak as pt reports to drive herself to MD office.   Discussed with pt on earlier call this RN CM plans to  transfer her to another RN CM who will be covering her area.   RN CM to give report to [employer] CM.      Zara Chess.   [facility] Care Management  [phone]

## 2015-07-17 ENCOUNTER — Other Ambulatory Visit: Payer: Self-pay | Admitting: *Deleted

## 2015-07-17 NOTE — Patient Outreach (Signed)
Attempt made to contact pt, f/u on yesterday's call - pt was to call MD about weight gain,sob, edema.  Also wanted to see if pt needed assistance with transportation.    HIPPA compliant voice message left with contact number.    Plan to transition pt to RN CM covering her area to continue to follow  for Transition of care.     Zara Chess.   [facility] Care Management  [phone]

## 2015-07-19 ENCOUNTER — Inpatient Hospital Stay: Payer: PPO | Admitting: Physician Assistant

## 2015-07-19 ENCOUNTER — Telehealth: Payer: Self-pay | Admitting: *Deleted

## 2015-07-19 NOTE — Telephone Encounter (Signed)
Received fax from [facility] stating this patient has been discharged from Hospital  Date of admit:07/09/15  Date of discharge:07/12/15  Level of Care: Inpatient Acute Medical Care  Discharging facility: [facility]  Attending physician: Teresa Pelton  PCP: Flonnie Hailstone  Discharge Dispostion: Discharged to [facility] care,(routine discharge)  DX: R11.10-Vomiting,Unspecified

## 2015-07-23 DIAGNOSIS — Z794 Long term (current) use of insulin: Secondary | ICD-10-CM | POA: Diagnosis not present

## 2015-07-23 DIAGNOSIS — E11621 Type 2 diabetes mellitus with foot ulcer: Secondary | ICD-10-CM | POA: Diagnosis not present

## 2015-07-23 DIAGNOSIS — L97821 Non-pressure chronic ulcer of other part of left lower leg limited to breakdown of skin: Secondary | ICD-10-CM | POA: Diagnosis not present

## 2015-07-23 DIAGNOSIS — L97811 Non-pressure chronic ulcer of other part of right lower leg limited to breakdown of skin: Secondary | ICD-10-CM | POA: Diagnosis not present

## 2015-07-23 DIAGNOSIS — I509 Heart failure, unspecified: Secondary | ICD-10-CM | POA: Diagnosis not present

## 2015-07-23 DIAGNOSIS — Z87891 Personal history of nicotine dependence: Secondary | ICD-10-CM | POA: Diagnosis not present

## 2015-07-23 DIAGNOSIS — D649 Anemia, unspecified: Secondary | ICD-10-CM | POA: Diagnosis not present

## 2015-07-23 DIAGNOSIS — E785 Hyperlipidemia, unspecified: Secondary | ICD-10-CM | POA: Diagnosis not present

## 2015-07-23 DIAGNOSIS — E11622 Type 2 diabetes mellitus with other skin ulcer: Secondary | ICD-10-CM | POA: Diagnosis not present

## 2015-07-23 DIAGNOSIS — M1990 Unspecified osteoarthritis, unspecified site: Secondary | ICD-10-CM | POA: Diagnosis not present

## 2015-07-23 DIAGNOSIS — E1136 Type 2 diabetes mellitus with diabetic cataract: Secondary | ICD-10-CM | POA: Diagnosis not present

## 2015-07-23 DIAGNOSIS — I252 Old myocardial infarction: Secondary | ICD-10-CM | POA: Diagnosis not present

## 2015-07-23 DIAGNOSIS — I890 Lymphedema, not elsewhere classified: Secondary | ICD-10-CM | POA: Diagnosis not present

## 2015-07-23 DIAGNOSIS — I110 Hypertensive heart disease with heart failure: Secondary | ICD-10-CM | POA: Diagnosis not present

## 2015-07-23 DIAGNOSIS — E1140 Type 2 diabetes mellitus with diabetic neuropathy, unspecified: Secondary | ICD-10-CM | POA: Diagnosis not present

## 2015-07-23 DIAGNOSIS — E039 Hypothyroidism, unspecified: Secondary | ICD-10-CM | POA: Diagnosis not present

## 2015-07-23 DIAGNOSIS — L97211 Non-pressure chronic ulcer of right calf limited to breakdown of skin: Secondary | ICD-10-CM | POA: Diagnosis not present

## 2015-07-23 DIAGNOSIS — I2510 Atherosclerotic heart disease of native coronary artery without angina pectoris: Secondary | ICD-10-CM | POA: Diagnosis not present

## 2015-07-23 DIAGNOSIS — I87332 Chronic venous hypertension (idiopathic) with ulcer and inflammation of left lower extremity: Secondary | ICD-10-CM | POA: Diagnosis not present

## 2015-07-23 DIAGNOSIS — Z6841 Body Mass Index (BMI) 40.0 and over, adult: Secondary | ICD-10-CM | POA: Diagnosis not present

## 2015-07-24 ENCOUNTER — Ambulatory Visit (HOSPITAL_COMMUNITY)
Admission: RE | Admit: 2015-07-24 | Discharge: 2015-07-24 | Disposition: A | Discharge status: Home / Self Care | Payer: PPO | Source: Ambulatory Visit | Attending: Internal Medicine | Admitting: Internal Medicine

## 2015-07-24 VITALS — BP 130/66 | HR 84 | Wt 285.0 lb

## 2015-07-24 DIAGNOSIS — E1165 Type 2 diabetes mellitus with hyperglycemia: Secondary | ICD-10-CM | POA: Insufficient documentation

## 2015-07-24 DIAGNOSIS — R0602 Shortness of breath: Secondary | ICD-10-CM

## 2015-07-24 DIAGNOSIS — I2510 Atherosclerotic heart disease of native coronary artery without angina pectoris: Secondary | ICD-10-CM | POA: Diagnosis not present

## 2015-07-24 DIAGNOSIS — Z8249 Family history of ischemic heart disease and other diseases of the circulatory system: Secondary | ICD-10-CM | POA: Insufficient documentation

## 2015-07-24 DIAGNOSIS — I130 Hypertensive heart and chronic kidney disease with heart failure and stage 1 through stage 4 chronic kidney disease, or unspecified chronic kidney disease: Secondary | ICD-10-CM | POA: Diagnosis not present

## 2015-07-24 DIAGNOSIS — N182 Chronic kidney disease, stage 2 (mild): Secondary | ICD-10-CM | POA: Diagnosis not present

## 2015-07-24 DIAGNOSIS — Z794 Long term (current) use of insulin: Secondary | ICD-10-CM | POA: Insufficient documentation

## 2015-07-24 DIAGNOSIS — I871 Compression of vein: Secondary | ICD-10-CM | POA: Diagnosis not present

## 2015-07-24 DIAGNOSIS — E039 Hypothyroidism, unspecified: Secondary | ICD-10-CM | POA: Diagnosis not present

## 2015-07-24 DIAGNOSIS — E669 Obesity, unspecified: Secondary | ICD-10-CM | POA: Insufficient documentation

## 2015-07-24 DIAGNOSIS — Z79899 Other long term (current) drug therapy: Secondary | ICD-10-CM | POA: Diagnosis not present

## 2015-07-24 DIAGNOSIS — I10 Essential (primary) hypertension: Secondary | ICD-10-CM

## 2015-07-24 DIAGNOSIS — E785 Hyperlipidemia, unspecified: Secondary | ICD-10-CM | POA: Diagnosis not present

## 2015-07-24 DIAGNOSIS — F329 Major depressive disorder, single episode, unspecified: Secondary | ICD-10-CM | POA: Diagnosis not present

## 2015-07-24 DIAGNOSIS — Z8673 Personal history of transient ischemic attack (TIA), and cerebral infarction without residual deficits: Secondary | ICD-10-CM | POA: Insufficient documentation

## 2015-07-24 DIAGNOSIS — I252 Old myocardial infarction: Secondary | ICD-10-CM | POA: Insufficient documentation

## 2015-07-24 DIAGNOSIS — Z7982 Long term (current) use of aspirin: Secondary | ICD-10-CM | POA: Insufficient documentation

## 2015-07-24 DIAGNOSIS — Z87891 Personal history of nicotine dependence: Secondary | ICD-10-CM | POA: Insufficient documentation

## 2015-07-24 DIAGNOSIS — I5022 Chronic systolic (congestive) heart failure: Secondary | ICD-10-CM | POA: Insufficient documentation

## 2015-07-24 DIAGNOSIS — I5032 Chronic diastolic (congestive) heart failure: Secondary | ICD-10-CM | POA: Diagnosis not present

## 2015-07-24 DIAGNOSIS — E1122 Type 2 diabetes mellitus with diabetic chronic kidney disease: Secondary | ICD-10-CM | POA: Diagnosis not present

## 2015-07-24 DIAGNOSIS — M1990 Unspecified osteoarthritis, unspecified site: Secondary | ICD-10-CM | POA: Insufficient documentation

## 2015-07-24 DIAGNOSIS — E1142 Type 2 diabetes mellitus with diabetic polyneuropathy: Secondary | ICD-10-CM | POA: Diagnosis not present

## 2015-07-24 LAB — BASIC METABOLIC PANEL
Anion gap: 12 (ref 5–15)
BUN: 40 mg/dL — ABNORMAL HIGH (ref 6–20)
CO2: 24 mmol/L (ref 22–32)
Calcium: 8.8 mg/dL — ABNORMAL LOW (ref 8.9–10.3)
Chloride: 98 mmol/L — ABNORMAL LOW (ref 101–111)
Creatinine, Ser: 1.59 mg/dL — ABNORMAL HIGH (ref 0.44–1.00)
GFR calc Af Amer: 38 mL/min — ABNORMAL LOW (ref >60)
GFR calc non Af Amer: 33 mL/min — ABNORMAL LOW (ref >60)
Glucose, Bld: 341 mg/dL — ABNORMAL HIGH (ref 65–99)
Potassium: 4.2 mmol/L (ref 3.5–5.1)
Sodium: 134 mmol/L — ABNORMAL LOW (ref 135–145)

## 2015-07-24 NOTE — Patient Instructions (Signed)
Your physician recommends that you schedule a follow-up appointment in: 4 weeks In the [place] following things EVERYDAY: 1) Weigh yourself in the morning before breakfast. Write it down and keep it in a log. 2) Take your medicines as prescribed 3) Eat low salt foods-Limit salt (sodium) to 2000 mg per day.  4) Stay as active as you can everyday 5) Limit all fluids for the day to less than 2 liters 6)

## 2015-07-24 NOTE — Progress Notes (Signed)
Advanced Heart Failure Medication Review by a Pharmacist  Does the patient  feel that his/her medications are working for him/her?  yes  Has the patient been experiencing any side effects to the medications prescribed?  no  Does the patient measure his/her own blood pressure or blood glucose at home?  yes   Does the patient have any problems obtaining medications due to transportation or finances?   no  Understanding of regimen: fair Understanding of indications: fair Potential of compliance: fair Patient understands to avoid NSAIDs. Patient understands to avoid decongestants.  Issues to address at subsequent visits: Compliance    Pharmacist comments:  Susan [name] is a pleasant 65 yo F presenting without a medication list and with a better understanding of her regimen but still not great. She reports fair compliance with her medications when she remembers to take them but was able to verbalize how she was taking her torsemide. She states that her Susan [name] is extremely expensive for her and asked if it would be ok for her to discontinue use after her current Rx runs out. I told her that she should not discontinue use without speaking to the provider that manages her diabetes first since there may be some patient assistance programs out there.   Susan [name]. Susan [name], PharmD, BCPS, CPP Clinical Pharmacist Pager: [phone] Phone: [phone] 07/24/2015 2:24 PM      Time with patient: 10 minutes Preparation and documentation time: 2 minutes Total time: 12 minutes

## 2015-07-24 NOTE — Progress Notes (Signed)
Patient ID: Susan [name], female   DOB: February 08, 1951, 65 y.o.   MRN: [id number]   PCP: Dr Edilia Bo  Cardiologist: Dr Myrtis Ser  Endocrinologist: Dr Lurene Shadow  Primary HF Cardiologist: Dr Leory Plowman   HPI: [name] is a 65 year old with a history of RBBB, DM, Hypothyroid, diastolic heart failure, CAD MI 2000/2007 BMS 2007, TIA, obesity.   Admitted 06/07/15 with N,V, hyperglycemia. Creatinine was up to 1.9. On the day discharge creatinine was back down to 1.2 Thought to be dehydrated so diuretics were held. Diuretics restarted on the day discharge. Discharge weight 282 pounds.   She returns for follow up. Overall feeling awful. Increased leg edema. Has not been taking metolazone. SOB with exertion. + Orthopnea. Denies PND. Increased leg edema. Weight at home trending up from 280--->290 pounds. She has not been taking metolazone and she is only taking torsemide 40 mg in am and 20 mg in pm. Not taking potassium due to cost.  She is not sure why she changed torsemide dose.   Admitted 2/13 through 07/13/15 with gastroenteritis and hyper glycemia. Diuretic initially held due to dehydration. Losartan and metolazone stopped with AKI/CKD. Discharge weight was 283 pounds.   Today she returns for HF follow up. Complaining of fatigue. Glucose has been out of control. Ongoing SOB with exertion. Denies PND. Sleeping on 3 pillows. Weight at home 286-287 pounds. Not weighing at home. Eating high salt foods. Drinking > 2 liters. Ongoing leg edema.    ECHO 07/11/2015- 50-55%.  Grade I DD  RHC 09/22/13: RA = 4  RV = 30/5/7  PA = 25/10 (16)  PCW = 7  Fick cardiac output/index = 6.3/2.7  PVR = 1.5 WU   FA sat = 93%  PA sat = 61%, 66%   Labs: 07/18/13 K 4.8 Creatinine 1.38 Glucose 1005 she refused to go to [facility] Labs : 08/17/13 K 3.9 Creatinine 1.4 Glucose 499 Labs: 09/29/13 K 4.5 Creatinine 1.4  Labs 04/18/14 K 4.4 Creatinine 1.18 Labs 03/26/2015: K 5.3 Creatinine 1.41  Labs 05/01/2015: K 4.9 Creatinine  1.8 Labs 05/04/2015: K 5.0 Creatinine 1.74  Labs 06/10/2015: K 4.7 Creatinine 1.21 Labs 06/12/2015: K 6.3 Creatinine 2.01  Labs 06/19/2015: K 4.5 Creatinine 1.29  Labs 07/10/2015: Hgb A1C 9.5  Labs 07/12/2015: K 4.4 Creatinine 1.62 K 4.4    SH: Former smoker quit 15 year ago. Does drink alcohol. She is disabled. Lives at home with her husband and disabled son - Husband is truck [employer] doctor. Son has a brain injury after he attempted suicide 2 years ago.    FH: Mom MI  ROS: All systems negative except as listed in HPI, PMH and Problem List.  Past Medical History  Diagnosis Date  . Tachycardia     Sinus tachycardia  . Dyslipidemia   . Hypothyroidism   . CAD (coronary artery disease)     MI in 2000 - MI  2007 - treated bare metal stent (no nuclear since then as 9/11)  . HTN (hypertension)   . Chronic diastolic heart failure (HCC)     a) ECHO (08/2013) EF 55-60% and RV function nl b) RHC (08/2013) RA 4, RV 30/5/7, PA 25/10 (16), PCWP 7, Fick CO/CI 6.3/2.7, PVR 1.5 WU, PA 61 and 66%  . RBBB (right bundle branch block)     Old  . Urinary incontinence   . Obesity   . Syncope     likely due to low blood sugar  . Carotid artery disease (HCC)   .  Acute MI (HCC) 1999; 2007  . CHF (congestive heart failure) (HCC)   . Anxiety   . Depression   . Type II diabetes mellitus (HCC)   . Peripheral neuropathy (HCC)   . Venous insufficiency   . Stroke [facility])     mini strokes  . PONV (postoperative nausea and vomiting)   . Renal insufficiency     DENIES  . Anemia     hx  . Anginal pain (HCC)   . Exertional shortness of breath   . Daily headache     "~ every other day; since I fell in June" (03/15/2014)  . Osteoarthritis   . Arthritis     "generalized" (03/15/2014)    Current Outpatient Prescriptions  Medication Sig Dispense Refill  . albuterol (PROVENTIL HFA;VENTOLIN HFA) 108 (90 BASE) MCG/ACT inhaler Inhale 2 puffs into the lungs every 6 (six) hours as needed for wheezing or shortness of breath.     Marland Kitchen aspirin EC 81 MG tablet Take 81 mg by mouth daily.    . carvedilol (COREG) 12.5 MG tablet Take 12.5 mg by mouth 2 (two) times daily with a meal.    . clonazePAM (KLONOPIN) 1 MG tablet Take 1 mg by mouth 3 (three) times daily as needed for anxiety.    . clopidogrel (PLAVIX) 75 MG tablet Take 1 tablet (75 mg total) by mouth daily with breakfast. 90 tablet 3  . ferrous sulfate 325 (65 FE) MG tablet Take 325 mg by mouth daily with breakfast.    . insulin NPH-regular Human (NOVOLIN 70/30) (70-30) 100 UNIT/ML injection Inject 12-75 Units into the skin 3 (three) times daily with meals. 75 units in the morning, and 50 with lunch and 12 in the evening    . isosorbide dinitrate (ISORDIL) 10 MG tablet TAKE 1 TABLET (10 MG TOTAL) BY MOUTH 2 (TWO) TIMES DAILY. 60 tablet 10  . KLOR-CON M20 20 MEQ tablet Take 20 mEq by mouth daily.    Marland Kitchen levothyroxine (SYNTHROID, LEVOTHROID) 75 MCG tablet Take 1 tablet (75 mcg total) by mouth daily before breakfast. 30 tablet 3  . losartan (COZAAR) 50 MG tablet Take 50 mg by mouth daily.    . metFORMIN (GLUMETZA) 500 MG (MOD) 24 hr tablet Take 500 mg by mouth 2 (two) times daily with a meal.    . PARoxetine (PAXIL-CR) 25 MG 24 hr tablet TAKE 1 TABLET (25 MG TOTAL) BY MOUTH DAILY. TAKEN WITH 37.5 FOR A TOTAL = 62.5MG  30 tablet 5  . PARoxetine (PAXIL-CR) 37.5 MG 24 hr tablet TAKE 1 TABLET (37.5 MG TOTAL) BY MOUTH DAILY. TAKEN WITH 25MG  FOR A TOTAL = 62.5 MG 30 tablet 5  . pregabalin (LYRICA) 150 MG capsule Take 150 mg by mouth 3 (three) times daily.     . promethazine (PHENERGAN) 12.5 MG tablet Take 1 tablet (12.5 mg total) by mouth every 6 (six) hours as needed for nausea or vomiting. 30 tablet 0  . torsemide (DEMADEX) 20 MG tablet Take 60 mg (3 tablets) in the morning and 40 mg (2tblet) in the afternoon 150 tablet 2  . TRADJENTA 5 MG TABS tablet Take 5 mg by mouth daily.  6  . nitroGLYCERIN (NITROSTAT) 0.4 MG SL tablet Place 1 tablet (0.4 mg total) under the tongue every 5  (five) minutes as needed for chest pain. (Patient not taking: Reported on 07/16/2015) 25 tablet 3   No current facility-administered medications for this encounter.    Filed Vitals:   07/24/15 1412  BP:  130/66  Pulse: 84  Weight: 285 lb (129.275 kg)  SpO2: 96%   PHYSICAL EXAM: General:  Chronically ill appearing. NAD distress walking in the clinic. Marland Kitchen   HEENT: normal Neck: supple. JVP difficult to assess d/t body habitus. Mildly elevated.    Carotids 2+ bilaterally; no bruits. No lymphadenopathy or thryomegaly appreciated. Cor: PMI normal. Regular rate & rhythm. No rubs, gallops or murmurs. Lungs: clear Abdomen: obese, soft, nontender, nondistended. No hepatosplenomegaly. No bruits or masses. Good bowel sounds. Extremities: no cyanosis, clubbing, rash, R and LLE trace-1+  edema  Neuro: alert & orientedx3, cranial nerves grossly intact. Moves all 4 extremities w/o difficulty. Affect pleasant.    ASSESSMENT & PLAN:  1. Chronic Systolic Heart Failure: NICM, EF 55-60% Grade II DD(05/2015 )  -- NYHA III symptoms. Volume status ok. Continue tosemide 60 mg in am and 40 mg  In pm.  Hold off on metolazone for now.  -Was supposed to be off losartan with recent AKI however she continued. Check BMET today  - Reinforced the need and importance of daily weights, a low sodium diet, and fluid restriction (less than 2 L a day). Instructed to call the HF clinic if weight increases more than 3 lbs overnight or 5 lbs in a week 2.. Dyspnea- Ongoing dyspnea with exertion. She has been offered sleep study many times but refuses.   3. Uncontrolled DM:  Recent admit with hyperglycemia. Has follow up with PCP.  I have asked her to call Dr Lurene Shadow for recommendations.  4. CAD- no evidence of ischemia- on aspirin and statin.  5. Obesity: Discussed portion control the importance of weight loss.  6. HTN- Stable continue current regimen.  7. CKD Stage II- Reviewed BMET from 07/12/15 .   Follow up 4 weeks.  Paramedicine to start next week.with weekly visits for 4 weeks.    Anvay Tennis NP-C  1:53 PM

## 2015-07-26 ENCOUNTER — Other Ambulatory Visit: Payer: Self-pay | Admitting: *Deleted

## 2015-07-26 ENCOUNTER — Telehealth: Payer: Self-pay | Admitting: Physician Assistant

## 2015-07-26 NOTE — Telephone Encounter (Signed)
Patient calling to speak to you regarding her peroxtine  [phone]

## 2015-07-26 NOTE — Patient Outreach (Signed)
[facility]) Care Management Transition of Care week 2 telephone outreach 07/26/2015  Esmeraldo Mayhall 09-Feb-1951 [id number]  Successful telephone outreach attempt for Roxana RN/ transition of care after recent hospital IP visit.  Mrs. Cooksey was discharged on July 12, 2015 after being admitted for uncontrolled DM.  HIPPA verified during telephone outreach.  I explained to Mrs. Tonne that I would be assisting her with transition of care needs after her hospital visit, and asked if she remembered receiving a phone call from Kathie Rhodes, [place] CM, last week.  Mrs. Hlavaty admitted she did not remember speaking to [facility], stating, "there have been so many people involved in my care, I just don't remember talking to her."  I explained [facility] services to her, and also asked if she recalled signing the [facility] consent form while in the hospital, which she did not.    Mrs. Gromer also stated that she "went to the heart failure clinic on Tuesday July 24, 2015, and they introduced me to Joellen [name], who will be coming out to visit me next week."  Mrs. Pekrul said, "it sounds like she will be doing the same thing you say you will be doing... I don't think I need both of you coming out to see me, it's just too much for me to deal with."  I told Mrs. Rosner that I would reach out to the Heart Failure clinic for clarification and we agreed that I would call her next week, after her visit with "Katie" to update her.  I attempted to contact the [facility] but was unsuccessful; will re-try next week prior to reaching back out to Mrs. Goar.  To note, I did not see a scheduled appointment for a home visit from any discipline with an EPIC appointment search.  Mrs. Kizer stated that she had been checking and recording her blood sugars 3 times per day, and stated that the values have ranged from 90-400's.  She reported that she has also been checking and recording her weights as  well.She stated that she has follow up visit with her cardiologist on Monday, July 30, 2015, and I advised her to take her medicines, her blood sugar and weight logs, which she said she would do.  I provided positive reinforcement and encouragement for monitoring her blood sugars and weights, and advised her to continue with self-monitoring; she said she would continue to do so.  Plan:  Mrs. Shorb will continue to monitor and record her blood sugars and weights, and will contact her medical providers for guidance should she have extreme fluctuations of either   Mrs. Strite will keep her provider appointments as scheduled with her cardiologist and "Katie" with the Heart failure clinic  I will follow up with the Advanced Heart Failure clinic to obtain clarification of home visit planned on Tuesday July 31, 2015  I will reach out to Mrs. Lessner for transition of care call, week 3, on Wednesday August 01, 2015  [name], RN, BSN, [place] [facility] Care Management  [phone]

## 2015-07-26 NOTE — Telephone Encounter (Signed)
Pt takes both Paroxetine 25 mg and Paroxetine 37.5 to equal 62.5 mg dose daily.  Insurance has denied paying for the 25 mg.  Have submitted quantity exception thru [facility]

## 2015-07-27 NOTE — Telephone Encounter (Signed)
Contacted insurance and I believe I have corrected the confusion.  Pt on two different doses of Paroxetine.  Insurance needs to cover both.  Told husband to have [name] check with pharmacy again about her refill.  If still problem call me back.

## 2015-07-29 ENCOUNTER — Other Ambulatory Visit: Payer: Self-pay | Admitting: Physician Assistant

## 2015-07-29 NOTE — Progress Notes (Signed)
This encounter was created in error - please disregard.  This encounter was created in error - please disregard.

## 2015-07-30 ENCOUNTER — Encounter: Payer: PPO | Admitting: Cardiology

## 2015-07-30 NOTE — Telephone Encounter (Signed)
LRF 05/01/15 #90 + 2.  LOV 06/14/15  OK refill?

## 2015-07-30 NOTE — Telephone Encounter (Signed)
rx called in

## 2015-07-30 NOTE — Telephone Encounter (Signed)
Approved. # 90 + 2. 

## 2015-07-31 ENCOUNTER — Other Ambulatory Visit: Payer: Self-pay | Admitting: *Deleted

## 2015-07-31 ENCOUNTER — Telehealth (HOSPITAL_COMMUNITY): Payer: Self-pay | Admitting: Cardiology

## 2015-07-31 DIAGNOSIS — I5022 Chronic systolic (congestive) heart failure: Secondary | ICD-10-CM

## 2015-07-31 NOTE — Telephone Encounter (Signed)
-----   Message from Conrad Huntley, NP sent at 07/24/2015  4:15 PM EST ----- Please call with results. Glucose elevated. Renal function ok. Needs for with Dr Bubba Camp ASAP for uncontrolled diabetes.

## 2015-07-31 NOTE — Telephone Encounter (Signed)
duting phone call patient reports she has been taking potassium daily Reviewed medication list and last OV 2/28, potassium was NOT restarted Advised patient to discontinue potassium and will need repeat BMET asap Patient reports she has appt with [facility] on 3/8, order placed

## 2015-07-31 NOTE — Patient Outreach (Signed)
[facility]) Care Management  07/31/2015  Shakerra Campen Jan 24, 1951 [id number]  Care Coordination call placed to Marylouise Stacks, EMT-P, [phone]) with paramedicine program of Heart Failure Clinic.  Katie had a scheduled visit with Ms. Mckean today. Explained to Joellen [name] that Ms. Graf had voiced to me during a recent phone call being overwhelmed with "too many people being involved" in her care, and therefore collaborated with her on possibility of [name] making joint home visit to patient in the future to ensure that all patient needs were appropriately addressed.  Katie returned my call this afternoon, and shared that Ms. Mccollom had agreed upon a joint visit for both [name] and paramedicine on Tuesday March 14 at 10:30 am.  Specific problem areas that Joellen [name] shared might be pertinent for [facility] management after her home visit today were:  Resources for seniors in her area; nutritional counseling for DM management; and high costs of her medicines.  Plan: I will call Ms. [facility] tomorrow as she and I had previously planned last week, as part of transition of care (week 2) post-hospital discharge, to confirm and schedule [facility] home visit with Joellen [name] in paramedecine with Heart Failure clinic, for Tuesday August 07, 2015 at 10:30 am.  [name], RN, BSN, [place] Coordinator [facility] Care Management  [phone]

## 2015-08-01 ENCOUNTER — Encounter: Payer: PPO | Admitting: Physician Assistant

## 2015-08-01 ENCOUNTER — Other Ambulatory Visit: Payer: Self-pay | Admitting: *Deleted

## 2015-08-01 ENCOUNTER — Ambulatory Visit (HOSPITAL_COMMUNITY)
Admission: RE | Admit: 2015-08-01 | Discharge: 2015-08-01 | Disposition: A | Discharge status: Home / Self Care | Payer: PPO | Source: Ambulatory Visit | Attending: Internal Medicine | Admitting: Internal Medicine

## 2015-08-01 ENCOUNTER — Telehealth (HOSPITAL_COMMUNITY): Payer: Self-pay | Admitting: Student

## 2015-08-01 DIAGNOSIS — I5023 Acute on chronic systolic (congestive) heart failure: Secondary | ICD-10-CM | POA: Diagnosis not present

## 2015-08-01 DIAGNOSIS — I5022 Chronic systolic (congestive) heart failure: Secondary | ICD-10-CM | POA: Insufficient documentation

## 2015-08-01 LAB — BASIC METABOLIC PANEL
Anion gap: 8 (ref 5–15)
BUN: 47 mg/dL — ABNORMAL HIGH (ref 6–20)
CO2: 27 mmol/L (ref 22–32)
Calcium: 8.9 mg/dL (ref 8.9–10.3)
Chloride: 105 mmol/L (ref 101–111)
Creatinine, Ser: 1.98 mg/dL — ABNORMAL HIGH (ref 0.44–1.00)
GFR calc Af Amer: 29 mL/min — ABNORMAL LOW (ref >60)
GFR calc non Af Amer: 25 mL/min — ABNORMAL LOW (ref >60)
Glucose, Bld: 100 mg/dL — ABNORMAL HIGH (ref 65–99)
Potassium: 4.9 mmol/L (ref 3.5–5.1)
Sodium: 140 mmol/L (ref 135–145)

## 2015-08-01 NOTE — Patient Outreach (Signed)
[name]) Care Management Transition of Care Telephone outreach attempt, week 2 08/01/2015  Susan [name] 1950-11-21 [id number]  Unsuccessful telephone outreach to patient; left HIPPA compliant VM msg, asking patient to return my call; if I do not hear back from her, I will re-try tomorrow afternoon.  I left my contact information on the VM msg I left for her.  Susan Rack, RN, BSN, [employer] [facility] Care Management  [phone]

## 2015-08-01 NOTE — Telephone Encounter (Signed)
Spoke with pt. Instructed her to cut losartan in half to 25 mg daily.  She repeated back to me and wrote down instructions.   Her weight is up 5 lbs.  Instructed to take 2.5 mg metolazone in the morning and call HF clinic back if weight continues to trend up.   Schedule labs for next Friday. Spoke with Chantel, CMA [place] for 200 pm labs on 08/10/15.   Legrand Como [address] [place], PA-C 08/01/2015 4:11 PM

## 2015-08-02 ENCOUNTER — Other Ambulatory Visit: Payer: Self-pay | Admitting: *Deleted

## 2015-08-02 NOTE — Progress Notes (Signed)
This encounter was created in error - please disregard.

## 2015-08-02 NOTE — Patient Outreach (Signed)
[facility]) Care Management Transition of Care telephone outreach week 3 08/02/2015  Susan [name] 22-Jan-1951 [id number]  Second attempt this week to reach patient by telephone for transition of care after recent inpatient hospital visit.  HIPPA compliant VM msg left for patient again today, asking her to return my call.  Plan:  Will re-attempt telephone contact with [name] early next week if she does not call me back  [name], RN, BSN, [place] Care Management  [phone]

## 2015-08-03 ENCOUNTER — Other Ambulatory Visit (HOSPITAL_COMMUNITY): Payer: PPO

## 2015-08-07 ENCOUNTER — Encounter: Payer: Self-pay | Admitting: *Deleted

## 2015-08-07 ENCOUNTER — Other Ambulatory Visit: Payer: Self-pay | Admitting: *Deleted

## 2015-08-07 VITALS — BP 126/74 | HR 78 | Resp 16 | Ht 66.0 in | Wt 287.0 lb

## 2015-08-07 DIAGNOSIS — I5032 Chronic diastolic (congestive) heart failure: Secondary | ICD-10-CM

## 2015-08-07 DIAGNOSIS — F32A Depression, unspecified: Secondary | ICD-10-CM

## 2015-08-07 DIAGNOSIS — F329 Major depressive disorder, single episode, unspecified: Secondary | ICD-10-CM

## 2015-08-07 NOTE — Addendum Note (Signed)
Addended by: Knox Royalty on: 08/07/2015 05:34 PM   Modules accepted: Orders

## 2015-08-07 NOTE — Patient Outreach (Addendum)
[facility]) Care Management   08/07/2015  Susan [name] [birth year] [id number]  Nija Wendland is an 65 y.o. female referred to [place] RN/ transition of care after recent hospital IP visit. Susan [name] was discharged on July 12, 2015 after being admitted for uncontrolled DM. Susan [name] has had 3 ED visits this year, 2 of which resulted in inpatient hospital stays for uncontrolled DM and CHF.  Susan [name] stated that since her discharge on July 12, 2015, she has continued checking and recording her blood sugars 3 times per day, and stated that the values have ranged from 90-400's. She reported that she has also been checking and recording her weights as well, which have stayed in a steady range between 285-289 pounds.  She reports that she is also monitoring her fluid intake, and "keep it at one liter per day."  Today's visit was originally supposed to be a joint visit with paramedicine through [facility] CHF clinic, but due to Susan [name] living in [place], paramedicine could no longer be involved; I obtained digital scales from the [facility] CHF clinic through [place] and delivered them to Susan [name] today.  Susan [name] states that she has been taking her medicine as prescribed and fills her own pill box weekly, although she admits that she has experienced some confusion about how to take her medicines, specifically her insulin, since her discharge home from the hospital.  Susan [name] stated that "Dr. [name] told me to take my insulin according to this scale she wrote down on this piece of paper when I got sent home from the hospital.... I don't know how long I was supposed to do that."  Susan [name] stated that she does not understand the purpose of the sliding scale insulin and has therefore had difficulty in following the scale.  I verified that there was no specific instructions on the paper she had regarding on how long the patient should use her sliding scale, and  will reach out to Dr. [name] for direction, asking the provider to contact patient in follow up on this issue.    Susan [name] also voiced issues around the costs of her medications increasing, stating, "they have all gone up in price.... I have stopped taking the iron because it is too expensive, and am considering stopping the Lyrica because it costs too much too."  Will refer to [facility] pharmacist for assistance in assessing Susan [name]'s costs of medications.  Susan [name] stated that she "does not know what Advanced Directives are."  We went over the Advanced Directives packet thoroughly.  Susan [name] stated that she "did not know who she could ask to be my spokesperson," stating and describing that she has minimal family support for her health issues.  Susan [name] also stated that she would like to have assistance with knowing what community resources are available for her so she could make some friends, stating that she "has not had anyone close to me since I stopped working 5 years ago."  I will refer [facility] CSW to assist with patients stated support issues and desire to become connected with community resources.  Today, we discussed fall prevention around Susan [name]'s reports that she "falls easily."  Susan [name] states that she does not have a cane or walker, and denies need for one at present, stating, "I think that would just make my unsteadiness worse."  We also discussed CHF zones, CHF education and health maintenance plan, blood glucose education and  DM management, and the importance of Mrs. Tietje keeping her provider appointments.  Mrs. Causevic stated that she "doesn't like going to the doctor because if my sugar is high they always send me to the hospital."  I discussed with Susan [name] how high blood glucose levels are considered an emergency and provided rationale for appropriate level of care with emergency situations, which she accepted.  Susan [name] that she feels overwhelmed with all of the different  doctors and instructions for her daily health management, for which I offered emotional support.  I also provided positive reinforcement and encouragement for monitoring her blood sugars and weights, managing her pill box, and advised her to continue with self-monitoring; she said she would continue to do so.  Subjective:   "I am very confused about what I should be doing with my insulin now that I am home from the hospital."  Objective:   BP 126/74 mmHg  Pulse 78  Resp 16  Ht 1.676 m (5\' 6" )  Wt 287 lb (130.182 kg)  BMI 46.35 kg/m2  SpO2 97%   Review of Systems  HENT:       Patient states she as "sinus headache/ facial pain"  Eyes: Negative.   Respiratory: Negative.   Cardiovascular: Positive for leg swelling.  Gastrointestinal: Negative.   Genitourinary: Negative.   Musculoskeletal: Positive for falls.  Skin: Negative.   Neurological: Positive for weakness and headaches.  Psychiatric/Behavioral: Positive for depression. The patient is nervous/anxious and has insomnia.     Physical Exam  Constitutional: She is oriented to person, place, and time. She appears well-developed and well-nourished. No distress.  Cardiovascular: Normal rate, regular rhythm and normal heart sounds.   Heart sounds distant secondary to large body mass, breasts  Respiratory: Effort normal and breath sounds normal. No respiratory distress. She has no wheezes. She has no rales.  GI: Soft. Bowel sounds are normal.  Musculoskeletal: She exhibits edema.  Moderate swelling and erythema noted bilaterally on lower legs; (L) leg > (R) leg; 1- 2+ edema noted; patient reports this as "baseline."   Neurological: She is alert and oriented to person, place, and time.  Skin: Skin is warm, dry and intact. There is erythema.  Moderate erythema noted bilaterally on lower legs; patient reports this as "baseline."  Psychiatric: She has a normal mood and affect. Her behavior is normal. Judgment and thought content normal.     Current Medications:   Current Outpatient Prescriptions  Medication Sig Dispense Refill  . albuterol (PROVENTIL HFA;VENTOLIN HFA) 108 (90 BASE) MCG/ACT inhaler Inhale 2 puffs into the lungs every 6 (six) hours as needed for wheezing or shortness of breath.    [place] Kitchen aspirin EC 81 MG tablet Take 81 mg by mouth daily.    . carvedilol (COREG) 12.5 MG tablet Take 12.5 mg by mouth 2 (two) times daily with a meal.    . clonazePAM (KLONOPIN) 1 MG tablet TAKE 1 TABLET BY MOUTH 3 TIMES A DAY 90 tablet 2  . clopidogrel (PLAVIX) 75 MG tablet Take 1 tablet (75 mg total) by mouth daily with breakfast. 90 tablet 3  . insulin NPH-regular Human (NOVOLIN 70/30) (70-30) 100 UNIT/ML injection Inject 12-75 Units into the skin 3 (three) times daily with meals. 75 units in the morning, and 50 with lunch and 12 in the evening    . isosorbide dinitrate (ISORDIL) 10 MG tablet TAKE 1 TABLET (10 MG TOTAL) BY MOUTH 2 (TWO) TIMES DAILY. 60 tablet 10  . levothyroxine (SYNTHROID, LEVOTHROID) 75  MCG tablet Take 1 tablet (75 mcg total) by mouth daily before breakfast. 30 tablet 3  . losartan (COZAAR) 50 MG tablet Take 50 mg by mouth daily.    . metFORMIN (GLUMETZA) 500 MG (MOD) 24 hr tablet Take 500 mg by mouth 2 (two) times daily with a meal.    . nitroGLYCERIN (NITROSTAT) 0.4 MG SL tablet Place 1 tablet (0.4 mg total) under the tongue every 5 (five) minutes as needed for chest pain. 25 tablet 3  . PARoxetine (PAXIL-CR) 25 MG 24 hr tablet TAKE 1 TABLET (25 MG TOTAL) BY MOUTH DAILY. TAKEN WITH 37.5 FOR A TOTAL = 62.5MG  30 tablet 5  . PARoxetine (PAXIL-CR) 37.5 MG 24 hr tablet TAKE 1 TABLET (37.5 MG TOTAL) BY MOUTH DAILY. TAKEN WITH 25MG  FOR A TOTAL = 62.5 MG 30 tablet 5  . pregabalin (LYRICA) 150 MG capsule Take 150 mg by mouth 3 (three) times daily.     [place] Kitchen torsemide (DEMADEX) 20 MG tablet Take 60 mg (3 tablets) in the morning and 40 mg (2tblet) in the afternoon 150 tablet 2  . TRADJENTA 5 MG TABS tablet Take 5 mg by mouth daily.   6  . ferrous sulfate 325 (65 FE) MG tablet Take 325 mg by mouth daily with breakfast. Reported on 08/07/2015    . KLOR-CON M20 20 MEQ tablet Take 20 mEq by mouth daily. Reported on 08/07/2015    . promethazine (PHENERGAN) 12.5 MG tablet Take 1 tablet (12.5 mg total) by mouth every 6 (six) hours as needed for nausea or vomiting. (Patient not taking: Reported on 08/07/2015) 30 tablet 0   No current facility-administered medications for this visit.    Functional Status:   In your present state of health, do you have any difficulty performing the following activities: 08/07/2015 07/11/2015  Hearing? Y N  Vision? N N  Difficulty concentrating or making decisions? Y N  Walking or climbing stairs? Y Y  Dressing or bathing? N N  Doing errands, shopping? N N  Preparing Food and eating ? N -  Using the Toilet? N -  In the past six months, have you accidently leaked urine? N -  Do you have problems with loss of bowel control? N -  Managing your Medications? N -  Managing your Finances? N -  Housekeeping or managing your Housekeeping? N -    Fall/Depression Screening:    PHQ 2/9 Scores 08/07/2015 03/16/2015  PHQ - 2 Score 4 0  PHQ- 9 Score 16 0    Assessment:  Mrs. Minot has taken many positive steps to manage her self-health maintenance skills around DM and CHF, but continues to feel overwhelmed and at times confused about her plan of care, which leads her to feel anxious.  She has expressed issues with how she should take her insulin now that she is home from the hospital, the rising costs of her medication, and her stated lack of support within her family unit.  She is interested in expanding her support systems, in preventing falls, and having information about financial assistance with her medication costs.   Plan:  Mrs. Michaux will continue monitoring and recording her daily weights, blood glucose levels (3 times per day). Mrs. Berretta will keep her scheduled provider appointments. I  communicated via in-basket message to her endocrinologist, Dr. [name], to facilitate patient obtaining clarification on use of sliding scale insulin now that she is home from the hospital, as well as to see if a post-discharge appointment can be  scheduled. I will refer [facility] CSW involvement to assist with addressing Mrs. Breshears's stated lack of support systems and interest in community resources. I will refer [place] to assess Mrs. Baity's medication benefits, as she reports the costs of her medications has increased. Mrs. Agreda will read and review the Advance Planning packet provided to her today. Mrs. Hardin will consider possibility of using a cane or walker to improve steadiness with mobility. Next [place] home visit planned for September 05, 2015 at 11:00 am  I appreciate the opportunity to participate in Mrs. Schooley's care.  [name], RN, BSN, [employer] [facility] Care Management  [phone]

## 2015-08-08 ENCOUNTER — Other Ambulatory Visit: Payer: Self-pay | Admitting: *Deleted

## 2015-08-08 NOTE — Patient Outreach (Signed)
[facility]) Care Management  08/08/2015  [name] Jul 04, 1950 [id number]  Returned call to patient after receiving a VM msg from her; successful telephone call, patient stated that she had called because she had trouble making the digital scales I provided to her yesterday from the [facility] CHF clinic work correctly.  Susan [name] said that she had "figured out the problem," and confirmed that she believed the scales were now working properly.  She confirmed that her weight today was 286.6 pounds.  I confirmed with [name] that I had reached out to Dr. [name], asking that the provider call [name] about her questions using the sliding scale insulin that had been recommended at her time of discharge from the hospital; I also let her know that I had asked the [place] and CSW to call her as well.  [name] was appreciative of the update.  [name], RN, BSN, [employer] [facility] Care Management  [phone]

## 2015-08-08 NOTE — Addendum Note (Signed)
Addended by: Knox Royalty on: 08/08/2015 03:30 PM   Modules accepted: Orders

## 2015-08-09 ENCOUNTER — Encounter: Payer: Self-pay | Admitting: Licensed Clinical Social Worker

## 2015-08-09 ENCOUNTER — Other Ambulatory Visit: Payer: Self-pay | Admitting: Licensed Clinical Social Worker

## 2015-08-09 NOTE — Patient Outreach (Signed)
Assessment:  CSW received referral on Kady Odonohue. CSW completed chart review on client. Client has been contacted by Reginia Naas RN with [facility] program.  Reginia Naas  RN sent referral to [place] regarding client need for information about community resources. CSW called client on 08/09/15 and spoke via phone with client. CSW verified client identity. CSW and client spoke of client needs.  CSW introduced self and described [facility] program. Client said she enjoy visits with Reginia Naas RN.  Client said she does not receive physical therapy sessions in the home at present. Client said she sees [name], Librarian, academic for medical care.  She said she has appointment with [name], next month.  She said she has her prescribed medications and is taking medications as prescribed. Reginia Naas RN also made a [facility] pharmacy referral for client.  CSW and client spoke of care plan of client. Client will try to attend her scheduled medical appointments in next 30 days.  CSW and client reviewed [facility] assessments for client. Client has completed [employer] consent form.  Client said [facility] [place] at least one time month.  Client drives herself to her scheduled medical appointments.  She said she has some difficulty getting in and out of her vehicle. CSW talked with client about Advanced Directives. CSW offered to help client in completing Advanced  Directives of choice.  CSW informed client about [facility] in [place]. CSW encouraged client to call or contact that [place] in [place] to learn more about social activities at that [facility].  CSW informed client that [facility] had calendar of monthly activities and had some free services for senior available.  CSW thanked client for phone call with CSW on 08/09/15.     Plan: Client to attend all scheduled client medical appointments in next 30 days. CSW to call client next week to assess client  needs.  [name].Lilia Letterman MSW, LCSW Licensed Clinical Social Worker [facility] Care Management [phone]

## 2015-08-10 ENCOUNTER — Ambulatory Visit (HOSPITAL_COMMUNITY)
Admission: RE | Admit: 2015-08-10 | Discharge: 2015-08-10 | Disposition: A | Discharge status: Home / Self Care | Payer: PPO | Source: Ambulatory Visit | Attending: Cardiology | Admitting: Cardiology

## 2015-08-10 DIAGNOSIS — I5032 Chronic diastolic (congestive) heart failure: Secondary | ICD-10-CM

## 2015-08-10 LAB — BASIC METABOLIC PANEL
Anion gap: 15 (ref 5–15)
BUN: 50 mg/dL — ABNORMAL HIGH (ref 6–20)
CO2: 23 mmol/L (ref 22–32)
Calcium: 9 mg/dL (ref 8.9–10.3)
Chloride: 99 mmol/L — ABNORMAL LOW (ref 101–111)
Creatinine, Ser: 1.44 mg/dL — ABNORMAL HIGH (ref 0.44–1.00)
GFR calc Af Amer: 43 mL/min — ABNORMAL LOW (ref >60)
GFR calc non Af Amer: 37 mL/min — ABNORMAL LOW (ref >60)
Glucose, Bld: 431 mg/dL — ABNORMAL HIGH (ref 65–99)
Potassium: 5.3 mmol/L — ABNORMAL HIGH (ref 3.5–5.1)
Sodium: 137 mmol/L (ref 135–145)

## 2015-08-17 ENCOUNTER — Other Ambulatory Visit: Payer: Self-pay | Admitting: Licensed Clinical Social Worker

## 2015-08-17 ENCOUNTER — Telehealth (HOSPITAL_COMMUNITY): Payer: Self-pay | Admitting: Cardiology

## 2015-08-17 NOTE — Patient Outreach (Signed)
Assessment:  CSW called client on 08/17/15 and spoke via phone with client on 08/17/15. CSW verified identity of client. [place] and CSW spoke of client needs. Client said she had her prescribed medications and is taking medications as prescribed. Client drives herself to her scheduled medical appointments.  CSW informed client that local [facility] at [address], [place] is a good resource for her to call to inquire about upcoming social activities at the [facility].  CSW gave client the phone number for [facility] [phone]).  CSW reminded client that [facility] had monthly calendar of activities and representative at the [facility] could speak more with client about upcoming social activities at the [facility]. Client has some family support.  Client said she does attend scheduled appointments at the [facility].  CSW and client spoke of client care plan. CSW encouraged client to attend all scheduled client medical appointments in the next 30 days. Client said she will try to attend her scheduled medical appointments.  Client said she is eating adequately and sleeping adequately.  Client does see [name], physician assistant, at [facility] for medical care.  Client has talked with [name] about sugar level fluctuations for client.  Client asked if CSW could call client back next week to further discuss client needs. CSW agreed to call client next week to discuss client needs.   Plan: Client to attend all scheduled client medical appointments in the next 30 days. CSW to call client next week to assess client needs.  [name].Heylee Tant MSW, LCSW Licensed Clinical Social Worker [facility] Care Management [phone]

## 2015-08-17 NOTE — Telephone Encounter (Signed)
-----   Message from Shirley Friar, PA-C sent at 08/10/2015  3:48 PM EDT ----- Creatinine much better. Please make sure she is not taking any potassium supplementation. (Not ordered, should not be.)  At this point with continued K elevation will need to hold Losartan for now.  Reviewed with Dr. Aundra Dubin and Pharmacist who agreed.      Legrand Como [address]" Chapman, PA-C 08/10/2015 3:47 PM

## 2015-08-17 NOTE — Telephone Encounter (Signed)
During phone call, patient reports weight has slowly increased [phone] today Per VO Dr.McLean ok for patient to take 2.5 mg of metolazone today Be sure to keep follow up 3/28 for further evaluation

## 2015-08-21 ENCOUNTER — Encounter (HOSPITAL_COMMUNITY): Payer: Self-pay

## 2015-08-21 ENCOUNTER — Ambulatory Visit (HOSPITAL_COMMUNITY)
Admission: RE | Admit: 2015-08-21 | Discharge: 2015-08-21 | Disposition: A | Discharge status: Home / Self Care | Payer: PPO | Source: Ambulatory Visit | Attending: Internal Medicine | Admitting: Internal Medicine

## 2015-08-21 VITALS — BP 168/77 | HR 61 | Resp 18 | Wt 299.0 lb

## 2015-08-21 DIAGNOSIS — E039 Hypothyroidism, unspecified: Secondary | ICD-10-CM | POA: Insufficient documentation

## 2015-08-21 DIAGNOSIS — I5022 Chronic systolic (congestive) heart failure: Secondary | ICD-10-CM | POA: Diagnosis not present

## 2015-08-21 DIAGNOSIS — R0600 Dyspnea, unspecified: Secondary | ICD-10-CM | POA: Diagnosis not present

## 2015-08-21 DIAGNOSIS — E1165 Type 2 diabetes mellitus with hyperglycemia: Secondary | ICD-10-CM | POA: Insufficient documentation

## 2015-08-21 DIAGNOSIS — E1122 Type 2 diabetes mellitus with diabetic chronic kidney disease: Secondary | ICD-10-CM | POA: Insufficient documentation

## 2015-08-21 DIAGNOSIS — Z8249 Family history of ischemic heart disease and other diseases of the circulatory system: Secondary | ICD-10-CM | POA: Insufficient documentation

## 2015-08-21 DIAGNOSIS — I428 Other cardiomyopathies: Secondary | ICD-10-CM | POA: Diagnosis not present

## 2015-08-21 DIAGNOSIS — I2510 Atherosclerotic heart disease of native coronary artery without angina pectoris: Secondary | ICD-10-CM | POA: Insufficient documentation

## 2015-08-21 DIAGNOSIS — I252 Old myocardial infarction: Secondary | ICD-10-CM | POA: Diagnosis not present

## 2015-08-21 DIAGNOSIS — N182 Chronic kidney disease, stage 2 (mild): Secondary | ICD-10-CM | POA: Diagnosis not present

## 2015-08-21 DIAGNOSIS — Z87891 Personal history of nicotine dependence: Secondary | ICD-10-CM | POA: Insufficient documentation

## 2015-08-21 DIAGNOSIS — I5032 Chronic diastolic (congestive) heart failure: Secondary | ICD-10-CM | POA: Diagnosis not present

## 2015-08-21 DIAGNOSIS — Z79899 Other long term (current) drug therapy: Secondary | ICD-10-CM | POA: Insufficient documentation

## 2015-08-21 DIAGNOSIS — I130 Hypertensive heart and chronic kidney disease with heart failure and stage 1 through stage 4 chronic kidney disease, or unspecified chronic kidney disease: Secondary | ICD-10-CM | POA: Insufficient documentation

## 2015-08-21 DIAGNOSIS — E1142 Type 2 diabetes mellitus with diabetic polyneuropathy: Secondary | ICD-10-CM | POA: Insufficient documentation

## 2015-08-21 DIAGNOSIS — E669 Obesity, unspecified: Secondary | ICD-10-CM | POA: Diagnosis not present

## 2015-08-21 DIAGNOSIS — Z7982 Long term (current) use of aspirin: Secondary | ICD-10-CM | POA: Insufficient documentation

## 2015-08-21 DIAGNOSIS — Z8673 Personal history of transient ischemic attack (TIA), and cerebral infarction without residual deficits: Secondary | ICD-10-CM | POA: Diagnosis not present

## 2015-08-21 DIAGNOSIS — I4510 Unspecified right bundle-branch block: Secondary | ICD-10-CM | POA: Insufficient documentation

## 2015-08-21 DIAGNOSIS — Z794 Long term (current) use of insulin: Secondary | ICD-10-CM | POA: Diagnosis not present

## 2015-08-21 DIAGNOSIS — E785 Hyperlipidemia, unspecified: Secondary | ICD-10-CM | POA: Diagnosis not present

## 2015-08-21 MED ORDER — METOLAZONE 5 MG PO TABS: 5.0000 mg | ORAL_TABLET | Freq: Every day | ORAL | Status: DC | PRN

## 2015-08-21 NOTE — Progress Notes (Signed)
Advanced Heart Failure Medication Review by a Pharmacist  Does the patient  feel that his/her medications are working for him/her?  no  Has the patient been experiencing any side effects to the medications prescribed?  no  Does the patient measure his/her own blood pressure or blood glucose at home?  no   Does the patient have any problems obtaining medications due to transportation or finances?   no  Understanding of regimen: good Understanding of indications: good Potential of compliance: good Patient understands to avoid NSAIDs. Patient understands to avoid decongestants.  Issues to address at subsequent visits: None   Pharmacist comments: 65 YO pleasant female presenting to HF clinic with her medication bottles or med list.  Pt was able to state when she takes each med. She states she is taking Losartan 12.5 mg daily.  Pt reports no SE to her meds or problems obtaining them.     Time with patient: 10 min  Preparation and documentation time: 5 min  Total time: 15 min

## 2015-08-21 NOTE — Progress Notes (Signed)
Patient ID: Nica Benedict, female   DOB: [birth year], 65 y.o.   MRN: [id number] Patient ID: Jinan Northway, female   DOB: [birth year], 65 y.o.   MRN: [id number]   PCP: Dr Edilia Bo  Cardiologist: Dr Myrtis Ser  Endocrinologist: Dr Lurene Shadow  Primary HF Cardiologist: Dr Leory Plowman   HPI: Mrs Crater is a 65 year old with a history of RBBB, DM, Hypothyroid, diastolic heart failure, CAD MI 2000/2007 BMS 2007, TIA, obesity.   Admitted 06/07/15 with N,V, hyperglycemia. Creatinine was up to 1.9. On the day discharge creatinine was back down to 1.2 Thought to be dehydrated so diuretics were held. Diuretics restarted on the day discharge. Discharge weight 282 pounds.   She returns for follow up. Overall feeling awful. Increased leg edema. Has not been taking metolazone. SOB with exertion. + Orthopnea. Denies PND. Increased leg edema. Weight at home trending up from 280--->290 pounds. She has not been taking metolazone and she is only taking torsemide 40 mg in am and 20 mg in pm. Not taking potassium due to cost.  She is not sure why she changed torsemide dose.   Admitted 2/13 through 07/13/15 with gastroenteritis and hyper glycemia. Diuretic initially held due to dehydration. Losartan and metolazone stopped with AKI/CKD. Discharge weight was 283 pounds.   Today she returns for HF follow up. SOB with exertion. Having difficulty walking due to dyspnea. Inhalers are helping Weight at home trending up. 285--299 pounds. Has had one metolazone in the last week. Still eating high salt foods. Drinking > 2 liters per day.   ECHO 07/11/2015- 50-55%.  Grade I DD  RHC 09/22/13: RA = 4  RV = 30/5/7  PA = 25/10 (16)  PCW = 7  Fick cardiac output/index = 6.3/2.7  PVR = 1.5 WU   FA sat = 93%  PA sat = 61%, 66%   Labs: 07/18/13 K 4.8 Creatinine 1.38 Glucose 1005 she refused to go to [facility] Labs : 08/17/13 K 3.9 Creatinine 1.4 Glucose 499 Labs: 09/29/13 K 4.5 Creatinine 1.4  Labs 04/18/14 K 4.4 Creatinine 1.18 Labs  03/26/2015: K 5.3 Creatinine 1.41  Labs 05/01/2015: K 4.9 Creatinine 1.8 Labs 05/04/2015: K 5.0 Creatinine 1.74  Labs 06/10/2015: K 4.7 Creatinine 1.21 Labs 06/12/2015: K 6.3 Creatinine 2.01  Labs 06/19/2015: K 4.5 Creatinine 1.29  Labs 07/10/2015: Hgb A1C 9.5  Labs 07/12/2015: K 4.4 Creatinine 1.62 K 4.4  Labs 08/10/2015: K 5.3 Creatinine 1.44    SH: Former smoker quit 15 year ago. Does drink alcohol. She is disabled. Lives at home with her husband and disabled son - Husband is truck [employer] doctor. Son has a brain injury after he attempted suicide 2 years ago.    FH: Mom MI  ROS: All systems negative except as listed in HPI, PMH and Problem List.  Past Medical History  Diagnosis Date  . Tachycardia     Sinus tachycardia  . Dyslipidemia   . Hypothyroidism   . CAD (coronary artery disease)     MI in 2000 - MI  2007 - treated bare metal stent (no nuclear since then as 9/11)  . HTN (hypertension)   . Chronic diastolic heart failure (HCC)     a) ECHO (08/2013) EF 55-60% and RV function nl b) RHC (08/2013) RA 4, RV 30/5/7, PA 25/10 (16), PCWP 7, Fick CO/CI 6.3/2.7, PVR 1.5 WU, PA 61 and 66%  . RBBB (right bundle branch block)     Old  . Urinary incontinence   . Obesity   .  Syncope     likely due to low blood sugar  . Carotid artery disease (HCC)   . Acute MI (HCC) 1999; 2007  . CHF (congestive heart failure) (HCC)   . Anxiety   . Depression   . Type II diabetes mellitus (HCC)   . Peripheral neuropathy (HCC)   . Venous insufficiency   . Stroke [facility])     mini strokes  . PONV (postoperative nausea and vomiting)   . Renal insufficiency     DENIES  . Anemia     hx  . Anginal pain (HCC)   . Exertional shortness of breath   . Daily headache     "~ every other day; since I fell in June" (03/15/2014)  . Osteoarthritis   . Arthritis     "generalized" (03/15/2014)    Current Outpatient Prescriptions  Medication Sig Dispense Refill  . albuterol (PROVENTIL HFA;VENTOLIN HFA) 108 (90 BASE)  MCG/ACT inhaler Inhale 2 puffs into the lungs every 6 (six) hours as needed for wheezing or shortness of breath.    Marland Kitchen aspirin EC 81 MG tablet Take 81 mg by mouth daily.    . carvedilol (COREG) 12.5 MG tablet Take 12.5 mg by mouth 2 (two) times daily with a meal.    . clonazePAM (KLONOPIN) 1 MG tablet TAKE 1 TABLET BY MOUTH 3 TIMES A DAY 90 tablet 2  . clopidogrel (PLAVIX) 75 MG tablet Take 1 tablet (75 mg total) by mouth daily with breakfast. 90 tablet 3  . ferrous sulfate 325 (65 FE) MG tablet Take 325 mg by mouth daily with breakfast. Reported on 08/07/2015    . insulin NPH-regular Human (NOVOLIN 70/30) (70-30) 100 UNIT/ML injection Inject 12-75 Units into the skin 3 (three) times daily with meals. 75 units in the morning, and 50 with lunch and 12 in the evening    . isosorbide dinitrate (ISORDIL) 10 MG tablet TAKE 1 TABLET (10 MG TOTAL) BY MOUTH 2 (TWO) TIMES DAILY. 60 tablet 10  . levothyroxine (SYNTHROID, LEVOTHROID) 75 MCG tablet Take 1 tablet (75 mcg total) by mouth daily before breakfast. 30 tablet 3  . metFORMIN (GLUMETZA) 500 MG (MOD) 24 hr tablet Take 500 mg by mouth 2 (two) times daily with a meal.    . nitroGLYCERIN (NITROSTAT) 0.4 MG SL tablet Place 1 tablet (0.4 mg total) under the tongue every 5 (five) minutes as needed for chest pain. 25 tablet 3  . PARoxetine (PAXIL-CR) 25 MG 24 hr tablet TAKE 1 TABLET (25 MG TOTAL) BY MOUTH DAILY. TAKEN WITH 37.5 FOR A TOTAL = 62.5MG  30 tablet 5  . PARoxetine (PAXIL-CR) 37.5 MG 24 hr tablet TAKE 1 TABLET (37.5 MG TOTAL) BY MOUTH DAILY. TAKEN WITH 25MG  FOR A TOTAL = 62.5 MG 30 tablet 5  . pregabalin (LYRICA) 150 MG capsule Take 150 mg by mouth 3 (three) times daily.     Marland Kitchen torsemide (DEMADEX) 20 MG tablet Take 60 mg (3 tablets) in the morning and 40 mg (2tblet) in the afternoon 150 tablet 2  . TRADJENTA 5 MG TABS tablet Take 5 mg by mouth daily.  6   No current facility-administered medications for this encounter.    Filed Vitals:   08/21/15 1429   BP: 168/77  Pulse: 61  Resp: 18  Weight: 299 lb (135.626 kg)  SpO2: 97%   PHYSICAL EXAM: General:  Chronically ill appearing. Marland Kitchen Dyspneic with exertion. Arrived in wheel chair.    HEENT: normal Neck: supple. JVP difficult to assess  d/t body habitus. 10-11.     Carotids 2+ bilaterally; no bruits. No lymphadenopathy or thryomegaly appreciated. Cor: PMI normal. Regular rate & rhythm. No rubs, gallops or murmurs. Lungs: clear Abdomen: obese, soft, nontender, ++distended. No hepatosplenomegaly. No bruits or masses. Good bowel sounds. Extremities: no cyanosis, clubbing, rash, R and LLE 1-2+  edema  Neuro: alert & orientedx3, cranial nerves grossly intact. Moves all 4 extremities w/o difficulty. Affect pleasant.    ASSESSMENT & PLAN:  1. Chronic Systolic Heart Failure: NICM, EF 55-60% Grade II DD(05/2015 )  -- NYHA III symptoms. Volume status elevated. Continue tosemide 60 mg in am and 40 mg  In pm.  Take metolazone for the next 2 days. Check BMET next visit.  -Continue losartan 12.5 mg daily.   - Reinforced the need and importance of daily weights, a low sodium diet, and fluid restriction (less than 2 L a day). Instructed to call the HF clinic if weight increases more than 3 lbs overnight or 5 lbs in a week 2.. Dyspnea- Ongoing dyspnea with exertion. She has been offered sleep study many times but refuses.   3. Uncontrolled DM:  . Has follow up with PCP.  I have asked her to call Dr Lurene Shadow for recommendations.  4. CAD- no evidence of ischemia- on aspirin and statin.  5. Obesity: Discussed portion control the importance of weight loss.  6. HTN- Elevated today. Increasing diuresis as above.   7. CKD Stage II- Reviewed BMET from 07/12/15 .   Follow up 2 weeks. Check BMET next visit. [name] following. Consider increasing losartan to 25 mg at next visit. She remains hard to manage due to ongoing diet issues. I will contact [name] to follow her closely.    Jil Penland NP-C  2:33 PM

## 2015-08-21 NOTE — Patient Instructions (Signed)
Take 5 mg metolazone once [facility] AND THURSDAY.  Follow up 2 weeks with [name].  Do the following things EVERYDAY: 1) Weigh yourself in the morning before breakfast. Write it down and keep it in a log. 2) Take your medicines as prescribed 3) Eat low salt foods-Limit salt (sodium) to 2000 mg per day.  4) Stay as active as you can everyday 5) Limit all fluids for the day to less than 2 liters

## 2015-08-23 ENCOUNTER — Other Ambulatory Visit: Payer: Self-pay | Admitting: *Deleted

## 2015-08-23 NOTE — Patient Outreach (Signed)
[facility]) Care Management  08/23/2015  Perlene Sigley 1950-11-22 [id number]  Successful telephone outreach to [name], followed by [facility] for recent transition of care after IP hospital visit, and to check in after recent visit to heart and Vascular CHF clinic.  Mrs. Ritzman again states that she is "not doing well at all," and that she attributes this to a recent fall she experienced in her home on Tuesday 08/21/15, stating, "I am just sore all over and I feel like I can't get around because I am just too sore and stiff."  Patient denied injuries from the fall and stated that she is "just all bruised up; my back and chest are just sore."  Mrs. Gunsallus stated that she told her providers at the CHF clinic about her fall; I advised that if she continued having soreness from the fall to make her PCP aware as well,a nd she agreed to do so.  I re-iterated the AVS instructions given to the patient after her visit to the CHF clinic this week.  Mrs. Whitler expressed some confusion about the correct dosing she should take of the metolazone prescribed by Darrick Grinder during that visit, and stated she did not take any yesterday because she could not remember what Amy had instructed her to do.  Mrs. Bardon stated that she called the nurse at the CHF clinic this morning to clarify, and was told to take what she normally does, so she "only took one" of her 2.5 mg doses.  I clarified for her that according to Amy's orders, she should take (2) 2.5 mg to equal a total dose of 5.0 mg.  Because Mrs. Wierman shared that she did not take any yesterday, on Wednesday as instructed by Amy, I advised her to take the medicine as prescribed by Amy both today and tomorrow.  Mrs. Berninger repeated these instructions back to me accurately.  Mrs. Borgese reported a weight today of 297.6, and a fasting blood glucose this morning of 271. I re-iterated instructions that she should notify the CHF clinic if she has gained  >3 lbs overnight, or > 5 pounds in one week, and she said she would.  During our call today, we thoroughly went over the importance of limiting her salt and fluid intake, as well as taking the medications she has been prescribed as ordered by her providers.  I offered to re-arrange our next home visit so that I could visit her next week, earlier than we had previously scheduled, but Mrs. Rockholt stated that she could not meet next week due to a conflict with her personal schedule.  I made sure that she has my contact information if she wished to call me before our next scheduled appointment, and we discussed that I would bring educational materials for her regarding salt and fluid restriction in CHF, as well as educational materials about DM, as we had planned during our last home visit.  Plan: Mrs. Michaux will take the metolazone as prescribed by [facility] during her OV this week.  Because she did not take any yesterday as prescribed, she will take tomorrow to have 2 full days of dosing, as instructed by Amy. Mrs. Kopplin will continue to limit her salt/ fluids as directed by CHF clinic/ [name]. Mrs. Poppa will call this [facility] CM if she has questions/ needs or runs into problems before our next in home visit. Mrs. Littlepage will keep her next scheduled appointment with the  CHF clinic on Tuesday September 04, 2015. Next scheduled [facility] in-home visit planned for Wednesday September 05, 2015 at 11:00 am.  [name], RN, BSN, [place] Care Management  [phone]

## 2015-08-24 ENCOUNTER — Other Ambulatory Visit: Payer: Self-pay | Admitting: Pharmacist

## 2015-08-24 ENCOUNTER — Other Ambulatory Visit: Payer: Self-pay | Admitting: Licensed Clinical Social Worker

## 2015-08-24 NOTE — Patient Outreach (Signed)
Assessment:  CSW called client on 08/24/15 and spoke via phone with client on 08/24/15. CSW verified client identity. CSW and Blossie spoke of client needs. Client said she had her prescribed medications and is taking medications as prescribed. She said she drives herself to her scheduled medical appointments. She sees [name], Librarian, academic, at [employer], for medical care. Client also goes to the [facility] Clinic in [place], [place] as scheduled. CSW and client spoke of client care plan. CSW encouraged client to attend all scheduled client medical appointments in the next 30 days. Client said she has appointment scheduled with [name] in April of 2017.  She has swelling in her legs (lower extremity).  She said it is hard sometimes for her to get dressed or pull on pants due to swelling in her lower legs.  She said she had fallen recently  She had no serious injury from this fall.  She said she also had fallen in June of 2016.  She said her son visits her each week and is supportive. Her daughter is also supportive but her daughter works two jobs and is busy with job responsibilities.  Client said she has neuropathy.  She said she eats adequately and does not sleep very well. She said she fatigues easily.  She said she sees [name] in [place] to help her monitor her diabetes and her sugar levels.  She said she checks her sugar level three times daily.  She takes her medications as prescribed. CSW encouraged client to call CSW at [phone] as needed to discuss social work needs of client. Client also said Reginia Naas, [facility] RN case manager is scheduled to conduct a routine home visit with client on 09/05/15 to discuss nursing needs of client.. CSW thanked client for phone conversation with CSW on 08/24/15.    Plan: Client to attend all scheduled client medical appointments in the next 30 days. CSW to call client in 4 weeks to assess client  needs.   [name].Saya Mccoll MSW, LCSW Licensed Clinical Social Worker [facility] Care Management [phone]

## 2015-08-27 ENCOUNTER — Telehealth: Payer: Self-pay | Admitting: Physician Assistant

## 2015-08-27 ENCOUNTER — Other Ambulatory Visit: Payer: Self-pay | Admitting: Pharmacist

## 2015-08-27 NOTE — Patient Outreach (Signed)
[facility]) Care Management  08/27/2015  Susan [name] May 16, 1951 [id number]   Pharmacist was scheduled to call patient at 1600 today regarding patient assistance.   Pharmacist called patient did not answer, left a HIPAA compliant message with return phone number for patient to call back.    Will attempt to reach out to patient again on Thursday 08/30/15 if no return call prior to then.    Karrie Meres, PharmD, [place] [phone]

## 2015-08-27 NOTE — Telephone Encounter (Signed)
Patient calling would like to talk to you regarding some of her meds  [phone]

## 2015-08-27 NOTE — Patient Outreach (Signed)
[facility]) Care Management  08/27/2015  Sharnette Hunke May 12, 1951 [id number]  This is a late entry for 08/24/15.  Patient was referred to [facility] for medication assistance.  Placed call to patient and verified name and date of birth, explained purpose of call.  Patient asked if pharmacist would call patient back on 08/27/15 at 1600.   Plan:  Pharmacist will call patient back on 08/27/15 at 1600 per patient request.   Karrie Meres, PharmD, [place] [phone]

## 2015-08-28 NOTE — Telephone Encounter (Signed)
Agree 

## 2015-08-28 NOTE — Telephone Encounter (Signed)
States has been having nausea and vomiting since Thursday last week.  AGAIN.  Said someone told her she should wait these episodes out.  I tyold her we all know what happens when she does this.  She get dehydrated and her sugars out of control.   Told her she needs to get to the urgent care today or the ED today.  Also, said when she went to get her Paxil her cost was over $70 and she can not afford so is going to stop taking!!!

## 2015-08-30 ENCOUNTER — Other Ambulatory Visit: Payer: Self-pay | Admitting: Pharmacist

## 2015-08-30 NOTE — Patient Outreach (Signed)
[facility]) Care Management  08/30/2015  Susan [name] Sep 17, 1950 [id number]   Pharmacist made a second attempt to contact.  Phone appointment was scheduled for Monday 08/27/15 and message was left by pharmacist asking for patient to return call.   Pharmacist attempted to contact patient again today, and had to leave another HIPAA compliant message asking for return call.    Will make another attempt to reach patient next week.    Karrie Meres, PharmD, [place] [phone]

## 2015-09-04 ENCOUNTER — Encounter (HOSPITAL_COMMUNITY): Payer: PPO

## 2015-09-05 ENCOUNTER — Other Ambulatory Visit: Payer: Self-pay | Admitting: *Deleted

## 2015-09-05 ENCOUNTER — Ambulatory Visit (HOSPITAL_COMMUNITY)
Admission: RE | Admit: 2015-09-05 | Discharge: 2015-09-05 | Disposition: A | Discharge status: Home / Self Care | Payer: PPO | Source: Ambulatory Visit | Attending: Internal Medicine | Admitting: Internal Medicine

## 2015-09-05 VITALS — BP 126/78 | HR 100 | Wt 285.0 lb

## 2015-09-05 DIAGNOSIS — Z7982 Long term (current) use of aspirin: Secondary | ICD-10-CM | POA: Insufficient documentation

## 2015-09-05 DIAGNOSIS — E669 Obesity, unspecified: Secondary | ICD-10-CM

## 2015-09-05 DIAGNOSIS — Z87891 Personal history of nicotine dependence: Secondary | ICD-10-CM | POA: Diagnosis not present

## 2015-09-05 DIAGNOSIS — I428 Other cardiomyopathies: Secondary | ICD-10-CM | POA: Insufficient documentation

## 2015-09-05 DIAGNOSIS — I130 Hypertensive heart and chronic kidney disease with heart failure and stage 1 through stage 4 chronic kidney disease, or unspecified chronic kidney disease: Secondary | ICD-10-CM | POA: Diagnosis not present

## 2015-09-05 DIAGNOSIS — E039 Hypothyroidism, unspecified: Secondary | ICD-10-CM | POA: Diagnosis not present

## 2015-09-05 DIAGNOSIS — I872 Venous insufficiency (chronic) (peripheral): Secondary | ICD-10-CM | POA: Insufficient documentation

## 2015-09-05 DIAGNOSIS — E1122 Type 2 diabetes mellitus with diabetic chronic kidney disease: Secondary | ICD-10-CM | POA: Insufficient documentation

## 2015-09-05 DIAGNOSIS — R0602 Shortness of breath: Secondary | ICD-10-CM

## 2015-09-05 DIAGNOSIS — E785 Hyperlipidemia, unspecified: Secondary | ICD-10-CM | POA: Diagnosis not present

## 2015-09-05 DIAGNOSIS — F419 Anxiety disorder, unspecified: Secondary | ICD-10-CM | POA: Insufficient documentation

## 2015-09-05 DIAGNOSIS — Z955 Presence of coronary angioplasty implant and graft: Secondary | ICD-10-CM | POA: Diagnosis not present

## 2015-09-05 DIAGNOSIS — Z794 Long term (current) use of insulin: Secondary | ICD-10-CM | POA: Diagnosis not present

## 2015-09-05 DIAGNOSIS — R0600 Dyspnea, unspecified: Secondary | ICD-10-CM | POA: Diagnosis not present

## 2015-09-05 DIAGNOSIS — N183 Chronic kidney disease, stage 3 (moderate): Secondary | ICD-10-CM | POA: Insufficient documentation

## 2015-09-05 DIAGNOSIS — I5022 Chronic systolic (congestive) heart failure: Secondary | ICD-10-CM | POA: Diagnosis not present

## 2015-09-05 DIAGNOSIS — F329 Major depressive disorder, single episode, unspecified: Secondary | ICD-10-CM | POA: Diagnosis not present

## 2015-09-05 DIAGNOSIS — Z79899 Other long term (current) drug therapy: Secondary | ICD-10-CM | POA: Insufficient documentation

## 2015-09-05 DIAGNOSIS — Z7902 Long term (current) use of antithrombotics/antiplatelets: Secondary | ICD-10-CM | POA: Insufficient documentation

## 2015-09-05 DIAGNOSIS — J209 Acute bronchitis, unspecified: Secondary | ICD-10-CM | POA: Diagnosis not present

## 2015-09-05 DIAGNOSIS — Z8249 Family history of ischemic heart disease and other diseases of the circulatory system: Secondary | ICD-10-CM | POA: Diagnosis not present

## 2015-09-05 DIAGNOSIS — E1142 Type 2 diabetes mellitus with diabetic polyneuropathy: Secondary | ICD-10-CM | POA: Diagnosis not present

## 2015-09-05 DIAGNOSIS — I2510 Atherosclerotic heart disease of native coronary artery without angina pectoris: Secondary | ICD-10-CM | POA: Insufficient documentation

## 2015-09-05 DIAGNOSIS — E1165 Type 2 diabetes mellitus with hyperglycemia: Secondary | ICD-10-CM | POA: Insufficient documentation

## 2015-09-05 DIAGNOSIS — I5032 Chronic diastolic (congestive) heart failure: Secondary | ICD-10-CM | POA: Diagnosis not present

## 2015-09-05 DIAGNOSIS — Z8673 Personal history of transient ischemic attack (TIA), and cerebral infarction without residual deficits: Secondary | ICD-10-CM | POA: Insufficient documentation

## 2015-09-05 DIAGNOSIS — N182 Chronic kidney disease, stage 2 (mild): Secondary | ICD-10-CM

## 2015-09-05 LAB — BASIC METABOLIC PANEL
Anion gap: 14 (ref 5–15)
BUN: 31 mg/dL — ABNORMAL HIGH (ref 6–20)
CO2: 25 mmol/L (ref 22–32)
Calcium: 8.4 mg/dL — ABNORMAL LOW (ref 8.9–10.3)
Chloride: 103 mmol/L (ref 101–111)
Creatinine, Ser: 1.8 mg/dL — ABNORMAL HIGH (ref 0.44–1.00)
GFR calc Af Amer: 33 mL/min — ABNORMAL LOW (ref >60)
GFR calc non Af Amer: 28 mL/min — ABNORMAL LOW (ref >60)
Glucose, Bld: 220 mg/dL — ABNORMAL HIGH (ref 65–99)
Potassium: 3.3 mmol/L — ABNORMAL LOW (ref 3.5–5.1)
Sodium: 142 mmol/L (ref 135–145)

## 2015-09-05 LAB — BRAIN NATRIURETIC PEPTIDE: B Natriuretic Peptide: 200.5 pg/mL — ABNORMAL HIGH (ref 0.0–100.0)

## 2015-09-05 MED ORDER — DOXYCYCLINE HYCLATE 100 MG PO TBEC: 100.0000 mg | DELAYED_RELEASE_TABLET | Freq: Two times a day (BID) | ORAL | Status: DC

## 2015-09-05 NOTE — Progress Notes (Signed)
Patient ID: Susan [name], female   DOB: 1951/05/08, 65 y.o.   MRN: [id number]   PCP: Dr Edilia Bo  Cardiologist: Dr Myrtis Ser  Endocrinologist: Dr Lurene Shadow  Primary HF Cardiologist: Dr Leory Plowman   HPI: Susan [name] is a 65 year old with a history of RBBB, DM, Hypothyroid, diastolic heart failure, CAD MI 2000/2007 BMS 2007, TIA, obesity.   Admitted 06/07/15 with N,V, hyperglycemia. Creatinine was up to 1.9. On the day discharge creatinine was back down to 1.2 Thought to be dehydrated so diuretics were held. Diuretics restarted on the day discharge. Discharge weight 282 pounds.   She returns for follow up. Overall feeling awful. Increased leg edema. Has not been taking metolazone. SOB with exertion. + Orthopnea. Denies PND. Increased leg edema. Weight at home trending up from 280--->290 pounds. She has not been taking metolazone and she is only taking torsemide 40 mg in am and 20 mg in pm. Not taking potassium due to cost.  She is not sure why she changed torsemide dose.   Admitted 2/13 through 07/13/15 with gastroenteritis and hyper glycemia. Diuretic initially held due to dehydration. Losartan and metolazone stopped with AKI/CKD. Discharge weight was 283 pounds.   Today she returns for HF follow up. Complaining nausea , congestion, and diarrhea. She has been taking muccinex and robitussin.Not using inhaler because she says she doesn't know how to use.  Nonproductive cough. Could not be seen by her PCP. Glucose at home has been < 250. Weight at home 289-291 pounds. Taking all medications.   ECHO 07/11/2015- 50-55%.  Grade I DD  RHC 09/22/13: RA = 4  RV = 30/5/7  PA = 25/10 (16)  PCW = 7  Fick cardiac output/index = 6.3/2.7  PVR = 1.5 WU   FA sat = 93%  PA sat = 61%, 66%   Labs: 07/18/13 K 4.8 Creatinine 1.38 Glucose 1005 she refused to go to [facility] Labs : 08/17/13 K 3.9 Creatinine 1.4 Glucose 499 Labs: 09/29/13 K 4.5 Creatinine 1.4  Labs 04/18/14 K 4.4 Creatinine 1.18 Labs 03/26/2015: K  5.3 Creatinine 1.41  Labs 05/01/2015: K 4.9 Creatinine 1.8 Labs 05/04/2015: K 5.0 Creatinine 1.74  Labs 06/10/2015: K 4.7 Creatinine 1.21 Labs 06/12/2015: K 6.3 Creatinine 2.01  Labs 06/19/2015: K 4.5 Creatinine 1.29  Labs 07/10/2015: Hgb A1C 9.5  Labs 07/12/2015: K 4.4 Creatinine 1.62 K 4.4  Labs 08/10/2015: K 5.3 Creatinine 1.44    SH: Former smoker quit 15 year ago. Does drink alcohol. She is disabled. Lives at home with her husband and disabled son - Husband is truck Hospital doctor. Son has a brain injury after he attempted suicide 2 years ago.    FH: Mom MI  ROS: All systems negative except as listed in HPI, PMH and Problem List.  Past Medical History  Diagnosis Date  . Tachycardia     Sinus tachycardia  . Dyslipidemia   . Hypothyroidism   . CAD (coronary artery disease)     MI in 2000 - MI  2007 - treated bare metal stent (no nuclear since then as 9/11)  . HTN (hypertension)   . Chronic diastolic heart failure (HCC)     a) ECHO (08/2013) EF 55-60% and RV function nl b) RHC (08/2013) RA 4, RV 30/5/7, PA 25/10 (16), PCWP 7, Fick CO/CI 6.3/2.7, PVR 1.5 WU, PA 61 and 66%  . RBBB (right bundle branch block)     Old  . Urinary incontinence   . Obesity   . Syncope  likely due to low blood sugar  . Carotid artery disease (HCC)   . Acute MI (HCC) 1999; 2007  . CHF (congestive heart failure) (HCC)   . Anxiety   . Depression   . Type II diabetes mellitus (HCC)   . Peripheral neuropathy (HCC)   . Venous insufficiency   . Stroke [facility])     mini strokes  . PONV (postoperative nausea and vomiting)   . Renal insufficiency     DENIES  . Anemia     hx  . Anginal pain (HCC)   . Exertional shortness of breath   . Daily headache     "~ every other day; since I fell in June" (03/15/2014)  . Osteoarthritis   . Arthritis     "generalized" (03/15/2014)    Current Outpatient Prescriptions  Medication Sig Dispense Refill  . albuterol (PROVENTIL HFA;VENTOLIN HFA) 108 (90 BASE) MCG/ACT inhaler  Inhale 2 puffs into the lungs every 6 (six) hours as needed for wheezing or shortness of breath.    Marland Kitchen aspirin EC 81 MG tablet Take 81 mg by mouth daily.    . carvedilol (COREG) 12.5 MG tablet Take 12.5 mg by mouth 2 (two) times daily with a meal.    . clonazePAM (KLONOPIN) 1 MG tablet TAKE 1 TABLET BY MOUTH 3 TIMES A DAY 90 tablet 2  . clopidogrel (PLAVIX) 75 MG tablet Take 1 tablet (75 mg total) by mouth daily with breakfast. 90 tablet 3  . ferrous sulfate 325 (65 FE) MG tablet Take 325 mg by mouth daily with breakfast. Reported on 08/21/2015    . insulin NPH-regular Human (NOVOLIN 70/30) (70-30) 100 UNIT/ML injection Inject 12-75 Units into the skin 3 (three) times daily with meals. 75 units in the morning, and 50 with lunch and 12 in the evening    . isosorbide dinitrate (ISORDIL) 10 MG tablet TAKE 1 TABLET (10 MG TOTAL) BY MOUTH 2 (TWO) TIMES DAILY. 60 tablet 10  . levothyroxine (SYNTHROID, LEVOTHROID) 75 MCG tablet Take 1 tablet (75 mcg total) by mouth daily before breakfast. 30 tablet 3  . losartan (COZAAR) 25 MG tablet Take 12.5 mg by mouth daily.    . metFORMIN (GLUMETZA) 500 MG (MOD) 24 hr tablet Take 500 mg by mouth 2 (two) times daily with a meal.    . metolazone (ZAROXOLYN) 5 MG tablet Take 1 tablet (5 mg total) by mouth daily as needed (CALL CHF CLINIC BEFORE YOU TAKE A DOSE). 30 tablet 0  . nitroGLYCERIN (NITROSTAT) 0.4 MG SL tablet Place 1 tablet (0.4 mg total) under the tongue every 5 (five) minutes as needed for chest pain. 25 tablet 3  . pregabalin (LYRICA) 150 MG capsule Take 150 mg by mouth 3 (three) times daily.     Marland Kitchen torsemide (DEMADEX) 20 MG tablet Take 60 mg (3 tablets) in the morning and 40 mg (2tblet) in the afternoon 150 tablet 2  . TRADJENTA 5 MG TABS tablet Take 5 mg by mouth daily.  6   No current facility-administered medications for this encounter.    Filed Vitals:   09/05/15 1401  BP: 126/78  Pulse: 100  Weight: 285 lb (129.275 kg)  SpO2: 96%   PHYSICAL  EXAM: General:  Chronically ill appearing. Ambulated slowly in the clinic.     HEENT: normal Neck: supple. JVP difficult to assess d/t body habitus. Does not appear elevate.d     Carotids 2+ bilaterally; no bruits. No lymphadenopathy or thryomegaly appreciated. Cor: PMI normal. Regular  rate & rhythm. No rubs, gallops or murmurs. Lungs: Expiratory Wheezes throughtout.  Abdomen: obese, soft, nontender, ++distended. No hepatosplenomegaly. No bruits or masses. Good bowel sounds. Extremities: no cyanosis, clubbing, rash, R and LLE trace-1+  edema  Neuro: alert & orientedx3, cranial nerves grossly intact. Moves all 4 extremities w/o difficulty. Affect pleasant.     ASSESSMENT & PLAN:  1. Chronic Systolic Heart Failure: NICM, EF 55-60% Grade II DD(05/2015 )  -- NYHA III symptoms. Volume status stable. Continue tosemide 60 mg in am and 40 mg  In pm.  - Continue metolazone as needed. Check BMET next visit.  -Continue losartan 12.5 mg daily.   - Reinforced the need and importance of daily weights, a low sodium diet, and fluid restriction (less than 2 L a day). Instructed to call the HF clinic if weight increases more than 3 lbs overnight or 5 lbs in a week 2.. Dyspnea- Ongoing dyspnea with exertion. She has been offered sleep study many times but refuses.  Has inhalers at home but not sure how to use them. Today she was shown a video on how to use inhalers.  3. Uncontrolled DM:  . Has follow up with PCP.   Dr Lurene Shadow managing.  4. CAD- no evidence of ischemia- on aspirin and statin.  5. Obesity: Discussed portion control the importance of weight loss.  6. HTN-  Stable.   7. CKD Stage II- BMET today.  8. Acute Bronchitis- Add doxycyline 100 mg twice a day x7 days.    Follow up 2 weeks. She remains hard to manage due to ongoing diet issues. I have asked her to attend the Heart Failure Support Group later this month.    Anija Brickner NP-C  2:06 PM

## 2015-09-05 NOTE — Patient Instructions (Signed)
START Doxycycline 100mg  tablet twice daily for 7 days.  Follow up 2 weeks with [name] NP-C.  Do the following things EVERYDAY: 1) Weigh yourself in the morning before breakfast. Write it down and keep it in a log. 2) Take your medicines as prescribed 3) Eat low salt foods-Limit salt (sodium) to 2000 mg per day.  4) Stay as active as you can everyday 5) Limit all fluids for the day to less than 2 liters

## 2015-09-05 NOTE — Patient Outreach (Signed)
[place] [facility]) Care Management  09/05/2015  Alliemae Dengler Feb 16, 1951 [id number]  Successful telephone outreach to [name], followed by [place] for recent transition of care after IP hospital visit, as well as for self-management of chronic disease states of CHF and DM.   I received a VM msg from Mrs. Dyke this morning, stating that she needed to cancel our previously scheduled home visit this morning.  I called her back, and Mrs. Yuh states that she is "not doing well at all," stating that she had been sick "all of last week" with nausea, vomiting, and diarrhea.  Mrs. Artus stated that those symptoms have "pretty much" resolved now, but states that she believes she has "allergies" from the pollen outside, and is now experiencing headache, cough, and chest congestion.  Mrs. Lennen reports that she cannot get anything up when she coughs and "feels miserable."  Mrs. Mellis reports that she did not seek health care follow up for the GI symptoms that she experienced last week.  Mrs. Marrin stated that she felt so sick yesterday, she [facility], and rescheduled for an appointment there this afternoon.  Mrs. Stratford states that she cannot make both the [facility]) in-home visit and the [facility] visit, as it is "just too much in one day when I feel so bad."  Mrs. Kellen reports that she has continued checking her blood sugars, stating that "yesterday it was 109 in the morning."  Mrs. Shippee stated she has not yet checked her blood sugar this morning.  Mrs. Rzepecki reports that her weight has "not changed," and is holding steady in the 296-298 pound range.  I strongly encouraged Mrs. Kreager to attend her appointment this [facility], and she said she would do so.  I will let [name] with [facility] know that I spoke with Mrs. Gelles this morning, and will follow Mrs. Bonaparte's  progress after her scheduled appointment there this afternoon.  Today, I counseled Mrs. Meraz to always contact her medical providers as soon as possible when she is experiencing acute symptoms of illness, so she can be evaluated quickly.  Plan: Mrs. Mead will attend this [facility]'s appointment [facility]. Mrs. Sabados will continue to monitor and record her weights and blood sugars daily. Mrs. Pilcher will seek immediate medical attention when she feels sick or has an urgent medical issue. [name] with [facility] [facility] notified of this telephone outreach.  I will follow Mrs. Scafidi's progress going forward, and will reach out to her next week to re-schedule today's cancelled in-home visit with [place] CM.   [name], RN, BSN, [employer] [facility] Care Management  [phone]

## 2015-09-06 ENCOUNTER — Other Ambulatory Visit (HOSPITAL_COMMUNITY): Payer: Self-pay | Admitting: *Deleted

## 2015-09-06 ENCOUNTER — Telehealth (HOSPITAL_COMMUNITY): Payer: Self-pay | Admitting: *Deleted

## 2015-09-06 MED ORDER — POTASSIUM CHLORIDE CRYS ER 20 MEQ PO TBCR: 20.0000 meq | EXTENDED_RELEASE_TABLET | Freq: Every day | ORAL | Status: DC

## 2015-09-06 NOTE — Telephone Encounter (Signed)
Entered in error

## 2015-09-10 ENCOUNTER — Other Ambulatory Visit: Payer: Self-pay | Admitting: *Deleted

## 2015-09-10 NOTE — Patient Outreach (Signed)
[place] [facility]) Care Management  09/10/2015  Hayley Kierstead 1950-06-09 [id number]  Unsuccessful telephone outreach to [name], followed by [place] for recent transition of care after IP hospital visit, as well as for self-management of chronic disease states of CHF and DM.  HIPPA compliant VM msg left for Mrs. Stenerson, asking her to return my call so we could re-schedule [place] home visit which was cancelled last week because Mrs. Vidrio was sick.  Plan: I will re-attempt this call later this week if I do not hear back from Mrs. Bobb first.  [name], RN, BSN, [place] Care Management  [phone]

## 2015-09-11 ENCOUNTER — Other Ambulatory Visit: Payer: Self-pay | Admitting: Pharmacist

## 2015-09-11 ENCOUNTER — Encounter: Payer: Self-pay | Admitting: Pharmacist

## 2015-09-11 ENCOUNTER — Other Ambulatory Visit: Payer: Self-pay | Admitting: Physician Assistant

## 2015-09-11 ENCOUNTER — Other Ambulatory Visit: Payer: Self-pay | Admitting: Cardiology

## 2015-09-11 ENCOUNTER — Other Ambulatory Visit (HOSPITAL_COMMUNITY): Payer: Self-pay | Admitting: Internal Medicine

## 2015-09-11 NOTE — Patient Outreach (Signed)
Susan [name] [facility]) Care Management  09/11/2015  Susan [name] 1950/12/01 [id number]  Patient was referred to [place] for medication assistance.  Pharmacist made a third attempt to reach patient today via phone and had to leave a HIPAA compliant voicemail.    Will send a patient outreach letter and if no response in 10 business days will close out pharmacy case.    Will also let Richarda Osmond, [facility] RN Community know of unsuccessful attempts to reach patient.    Karrie Meres, PharmD, [place] [phone]

## 2015-09-11 NOTE — Telephone Encounter (Signed)
Refill appropriate and filled per protocol. 

## 2015-09-13 ENCOUNTER — Other Ambulatory Visit: Payer: Self-pay | Admitting: *Deleted

## 2015-09-13 NOTE — Patient Outreach (Signed)
[place] [facility]) Care Management  09/13/2015  Susan [name] Aug 03, 1950 [id number]  Unsuccessful telephone outreach to [name], followed by [place] for recent transition of care after IP hospital visit, as well as for self-management of chronic disease states of CHF and DM.  HIPPA compliant VM msg left for [name], asking her to return my call so we could re-schedule [place] home visit which was cancelled last week because [name] was sick.  Plan: I will re-attempt this call tomorrow if I do not hear back from Mrs. Gallop first.  [name], RN, BSN, [place] Care Management  [phone]

## 2015-09-14 ENCOUNTER — Other Ambulatory Visit: Payer: Self-pay | Admitting: *Deleted

## 2015-09-14 NOTE — Patient Outreach (Signed)
[place] [facility]) Care Management  09/14/2015  Susan [name] 08/26/1950 [id number]  Successful telephone outreach to [name], followed by [place] for recent transition of care after IP hospital visit, as well as for self-management of chronic disease states of CHF and DM.   Today, [name] reports that she is feeling much better after being so sick last week.  She stated that she has finished her antibiotics that were prescribed during her last visit to the [facility].  [name] stated that she has continued checking and recording her daily weights and blood sugars.  She states that her weights have maintained in the 183-185 pound range and that her sugars have been ranging from 110-140 range.  [name] states that her feet and ankle swelling "looks like it always does, it is no worse."  [name] acknowledges that she has several scheduled provider appoinments next week, and confirms that she plans to attend each of them.  I made sure [name] knew my general schedule and had my contact information, as well as the contact information for [facility] CM.  We were able to schedule and appointment today for our next [place] home visit in early May 2017.  Plan: [name] will continue to monitor and record her weights and blood sugars daily. [name] will attend all provider appointments scheduled for next week. [name] will seek immediate medical attention when she feels sick or has an urgent medical issue. Next [place] CM in-home visit scheduled for early May 2017.  [name], RN, BSN, [employer] [facility] Care Management  [phone]

## 2015-09-19 ENCOUNTER — Ambulatory Visit (HOSPITAL_COMMUNITY)
Admission: RE | Admit: 2015-09-19 | Discharge: 2015-09-19 | Disposition: A | Discharge status: Home / Self Care | Payer: PPO | Source: Ambulatory Visit | Attending: Internal Medicine | Admitting: Internal Medicine

## 2015-09-19 VITALS — BP 140/72 | HR 100 | Wt 285.0 lb

## 2015-09-19 DIAGNOSIS — I428 Other cardiomyopathies: Secondary | ICD-10-CM | POA: Diagnosis not present

## 2015-09-19 DIAGNOSIS — F419 Anxiety disorder, unspecified: Secondary | ICD-10-CM | POA: Diagnosis not present

## 2015-09-19 DIAGNOSIS — Z8673 Personal history of transient ischemic attack (TIA), and cerebral infarction without residual deficits: Secondary | ICD-10-CM | POA: Diagnosis not present

## 2015-09-19 DIAGNOSIS — Z87891 Personal history of nicotine dependence: Secondary | ICD-10-CM | POA: Diagnosis not present

## 2015-09-19 DIAGNOSIS — N182 Chronic kidney disease, stage 2 (mild): Secondary | ICD-10-CM

## 2015-09-19 DIAGNOSIS — Z7902 Long term (current) use of antithrombotics/antiplatelets: Secondary | ICD-10-CM | POA: Diagnosis not present

## 2015-09-19 DIAGNOSIS — I4510 Unspecified right bundle-branch block: Secondary | ICD-10-CM | POA: Diagnosis not present

## 2015-09-19 DIAGNOSIS — E669 Obesity, unspecified: Secondary | ICD-10-CM

## 2015-09-19 DIAGNOSIS — I10 Essential (primary) hypertension: Secondary | ICD-10-CM | POA: Diagnosis not present

## 2015-09-19 DIAGNOSIS — R0600 Dyspnea, unspecified: Secondary | ICD-10-CM | POA: Diagnosis not present

## 2015-09-19 DIAGNOSIS — Z8249 Family history of ischemic heart disease and other diseases of the circulatory system: Secondary | ICD-10-CM | POA: Diagnosis not present

## 2015-09-19 DIAGNOSIS — Z794 Long term (current) use of insulin: Secondary | ICD-10-CM | POA: Insufficient documentation

## 2015-09-19 DIAGNOSIS — I252 Old myocardial infarction: Secondary | ICD-10-CM | POA: Diagnosis not present

## 2015-09-19 DIAGNOSIS — F329 Major depressive disorder, single episode, unspecified: Secondary | ICD-10-CM | POA: Insufficient documentation

## 2015-09-19 DIAGNOSIS — E039 Hypothyroidism, unspecified: Secondary | ICD-10-CM | POA: Diagnosis not present

## 2015-09-19 DIAGNOSIS — I2510 Atherosclerotic heart disease of native coronary artery without angina pectoris: Secondary | ICD-10-CM | POA: Insufficient documentation

## 2015-09-19 DIAGNOSIS — E1122 Type 2 diabetes mellitus with diabetic chronic kidney disease: Secondary | ICD-10-CM | POA: Diagnosis not present

## 2015-09-19 DIAGNOSIS — Z79899 Other long term (current) drug therapy: Secondary | ICD-10-CM | POA: Insufficient documentation

## 2015-09-19 DIAGNOSIS — Z7982 Long term (current) use of aspirin: Secondary | ICD-10-CM | POA: Diagnosis not present

## 2015-09-19 DIAGNOSIS — I5032 Chronic diastolic (congestive) heart failure: Secondary | ICD-10-CM

## 2015-09-19 DIAGNOSIS — E1142 Type 2 diabetes mellitus with diabetic polyneuropathy: Secondary | ICD-10-CM | POA: Insufficient documentation

## 2015-09-19 DIAGNOSIS — E1165 Type 2 diabetes mellitus with hyperglycemia: Secondary | ICD-10-CM | POA: Diagnosis not present

## 2015-09-19 DIAGNOSIS — Z955 Presence of coronary angioplasty implant and graft: Secondary | ICD-10-CM | POA: Diagnosis not present

## 2015-09-19 DIAGNOSIS — I130 Hypertensive heart and chronic kidney disease with heart failure and stage 1 through stage 4 chronic kidney disease, or unspecified chronic kidney disease: Secondary | ICD-10-CM | POA: Diagnosis not present

## 2015-09-19 NOTE — Patient Instructions (Signed)
Follow up 4 weeks  Do the following things EVERYDAY: 1) Weigh yourself in the morning before breakfast. Write it down and keep it in a log. 2) Take your medicines as prescribed 3) Eat low salt foods-Limit salt (sodium) to 2000 mg per day.  4) Stay as active as you can everyday 5) Limit all fluids for the day to less than 2 liters

## 2015-09-19 NOTE — Progress Notes (Signed)
Patient ID: Trinaty Bressan, female   DOB: December 15, 1950, 65 y.o.   MRN: [id number]   PCP: Dr Edilia Bo  Cardiologist: Dr Myrtis Ser  Endocrinologist: Dr Lurene Shadow  Primary HF Cardiologist: Dr Leory Plowman   HPI: Mrs Goldbaum is a 65 year old with a history of RBBB, DM, Hypothyroid, diastolic heart failure, CAD MI 2000/2007 BMS 2007, TIA, obesity.   Admitted 06/07/15 with N,V, hyperglycemia. Creatinine was up to 1.9. On the day discharge creatinine was back down to 1.2 Thought to be dehydrated so diuretics were held. Diuretics restarted on the day discharge. Discharge weight 282 pounds.   She returns for follow up. Overall feeling awful. Increased leg edema. Has not been taking metolazone. SOB with exertion. + Orthopnea. Denies PND. Increased leg edema. Weight at home trending up from 280--->290 pounds. She has not been taking metolazone and she is only taking torsemide 40 mg in am and 20 mg in pm. Not taking potassium due to cost.  She is not sure why she changed torsemide dose.   Admitted 2/13 through 07/13/15 with gastroenteritis and hyper glycemia. Diuretic initially held due to dehydration. Losartan and metolazone stopped with AKI/CKD. Discharge weight was 283 pounds.   Today she returns for HF follow up. Overall feeling better. Mild dyspnea with exerion but says its better. Denies PND/Orthopnea. Weight at home 285-288 pounds.  Continues to eat hight salt foods. Taking all medications having difficulty paying for diabetes medications.    ECHO 07/11/2015- 50-55%.  Grade I DD  RHC 09/22/13: RA = 4  RV = 30/5/7  PA = 25/10 (16)  PCW = 7  Fick cardiac output/index = 6.3/2.7  PVR = 1.5 WU   FA sat = 93%  PA sat = 61%, 66%   Labs: 07/18/13 K 4.8 Creatinine 1.38 Glucose 1005 she refused to go to [facility] Labs : 08/17/13 K 3.9 Creatinine 1.4 Glucose 499 Labs: 09/29/13 K 4.5 Creatinine 1.4  Labs 04/18/14 K 4.4 Creatinine 1.18 Labs 03/26/2015: K 5.3 Creatinine 1.41  Labs 05/01/2015: K 4.9 Creatinine  1.8 Labs 05/04/2015: K 5.0 Creatinine 1.74  Labs 06/10/2015: K 4.7 Creatinine 1.21 Labs 06/12/2015: K 6.3 Creatinine 2.01  Labs 06/19/2015: K 4.5 Creatinine 1.29  Labs 07/10/2015: Hgb A1C 9.5  Labs 07/12/2015: K 4.4 Creatinine 1.62 K 4.4  Labs 08/10/2015: K 5.3 Creatinine 1.44    SH: Former smoker quit 15 year ago. Does drink alcohol. She is disabled. Lives at home with her husband and disabled son - Husband is truck Hospital doctor. Son has a brain injury after he attempted suicide 2 years ago.    FH: Mom MI  ROS: All systems negative except as listed in HPI, PMH and Problem List.  Past Medical History  Diagnosis Date  . Tachycardia     Sinus tachycardia  . Dyslipidemia   . Hypothyroidism   . CAD (coronary artery disease)     MI in 2000 - MI  2007 - treated bare metal stent (no nuclear since then as 9/11)  . HTN (hypertension)   . Chronic diastolic heart failure (HCC)     a) ECHO (08/2013) EF 55-60% and RV function nl b) RHC (08/2013) RA 4, RV 30/5/7, PA 25/10 (16), PCWP 7, Fick CO/CI 6.3/2.7, PVR 1.5 WU, PA 61 and 66%  . RBBB (right bundle branch block)     Old  . Urinary incontinence   . Obesity   . Syncope     likely due to low blood sugar  . Carotid artery disease (HCC)   .  Acute MI (HCC) 1999; 2007  . CHF (congestive heart failure) (HCC)   . Anxiety   . Depression   . Type II diabetes mellitus (HCC)   . Peripheral neuropathy (HCC)   . Venous insufficiency   . Stroke [facility])     mini strokes  . PONV (postoperative nausea and vomiting)   . Renal insufficiency     DENIES  . Anemia     hx  . Anginal pain (HCC)   . Exertional shortness of breath   . Daily headache     "~ every other day; since I fell in June" (03/15/2014)  . Osteoarthritis   . Arthritis     "generalized" (03/15/2014)    Current Outpatient Prescriptions  Medication Sig Dispense Refill  . albuterol (PROVENTIL HFA;VENTOLIN HFA) 108 (90 BASE) MCG/ACT inhaler Inhale 2 puffs into the lungs every 6 (six) hours as  needed for wheezing or shortness of breath.    Marland Kitchen aspirin EC 81 MG tablet Take 81 mg by mouth daily.    . carvedilol (COREG) 12.5 MG tablet Take 12.5 mg by mouth 2 (two) times daily with a meal.    . clonazePAM (KLONOPIN) 1 MG tablet TAKE 1 TABLET BY MOUTH 3 TIMES A DAY 90 tablet 2  . clopidogrel (PLAVIX) 75 MG tablet TAKE 1 TABLET (75 MG TOTAL) BY MOUTH DAILY WITH BREAKFAST. 90 tablet 2  . ferrous sulfate 325 (65 FE) MG tablet Take 325 mg by mouth daily with breakfast. Reported on 08/21/2015    . insulin NPH-regular Human (NOVOLIN 70/30) (70-30) 100 UNIT/ML injection Inject 12-75 Units into the skin 3 (three) times daily with meals. 75 units in the morning, and 50 with lunch and 12 in the evening    . isosorbide dinitrate (ISORDIL) 10 MG tablet TAKE 1 TABLET (10 MG TOTAL) BY MOUTH 2 (TWO) TIMES DAILY. 60 tablet 10  . levothyroxine (SYNTHROID, LEVOTHROID) 75 MCG tablet Take 1 tablet (75 mcg total) by mouth daily before breakfast. 30 tablet 3  . losartan (COZAAR) 50 MG tablet TAKE 1 TABLET BY MOUTH DAILY 90 tablet 1  . metFORMIN (GLUMETZA) 500 MG (MOD) 24 hr tablet Take 500 mg by mouth 2 (two) times daily with a meal.    . potassium chloride SA (K-DUR,KLOR-CON) 20 MEQ tablet Take 1 tablet (20 mEq total) by mouth daily. 90 tablet 3  . pregabalin (LYRICA) 150 MG capsule Take 150 mg by mouth 2 (two) times daily.     Marland Kitchen torsemide (DEMADEX) 20 MG tablet Take 60 mg (3 tablets) in the morning and 40 mg (2tblet) in the afternoon 150 tablet 2  . metolazone (ZAROXOLYN) 5 MG tablet Take 1 tablet (5 mg total) by mouth daily as needed (CALL CHF CLINIC BEFORE YOU TAKE A DOSE). (Patient not taking: Reported on 09/19/2015) 30 tablet 0  . nitroGLYCERIN (NITROSTAT) 0.4 MG SL tablet Place 1 tablet (0.4 mg total) under the tongue every 5 (five) minutes as needed for chest pain. (Patient not taking: Reported on 09/19/2015) 25 tablet 3   No current facility-administered medications for this encounter.    Filed Vitals:    09/19/15 1443  BP: 140/72  Pulse: 100  Weight: 285 lb (129.275 kg)  SpO2: 94%   PHYSICAL EXAM: General:  Chronically ill appearing. Ambulated in the clinic without difficulty.     HEENT: normal Neck: supple. JVP difficult to assess d/t body habitus. Does not appear elevate.d     Carotids 2+ bilaterally; no bruits. No lymphadenopathy or thryomegaly  appreciated. Cor: PMI normal. Regular rate & rhythm. No rubs, gallops or murmurs. Lungs: Clear .  Abdomen: obese, soft, nontender, nondistended. No hepatosplenomegaly. No bruits or masses. Good bowel sounds. Extremities: no cyanosis, clubbing, rash, R and LLE trace-1+  edema  Neuro: alert & orientedx3, cranial nerves grossly intact. Moves all 4 extremities w/o difficulty. Affect pleasant.     ASSESSMENT & PLAN: 1. Chronic Diastolic Heart Failure: NICM, EF 55-60% Grade II DD(05/2015 )  -- NYHA III symptoms. Volume status stable. Continue tosemide 60 mg in am and 40 mg in pm.  - Continue metolazone as needed.  -Continue losartan 12.5 mg daily.    - Reinforced the need and importance of daily weights, a low sodium diet, and fluid restriction (less than 2 L a day). Instructed to call the HF clinic if weight increases more than 3 lbs overnight or 5 lbs in a week 2.. Dyspnea- Improved.  She has been offered sleep study many times but refuses.  Continue inhalers as needed.   3. Uncontrolled DM:  . Has follow up with PCP.   Dr Lurene Shadow managing.  4. CAD- no evidence of ischemia- on aspirin and statin.  5. Obesity: Discussed portion control the importance of weight loss.  6. HTN-  Stable.   7. CKD Stage II-Stable. .  8. Acute Bronchitis- Resolved.     Follow up 4  weeks.    Fronia Depass NP-C  3:17 PM

## 2015-09-20 ENCOUNTER — Ambulatory Visit: Payer: PPO | Admitting: Physician Assistant

## 2015-09-20 ENCOUNTER — Other Ambulatory Visit: Payer: Self-pay | Admitting: Licensed Clinical Social Worker

## 2015-09-20 ENCOUNTER — Ambulatory Visit (INDEPENDENT_AMBULATORY_CARE_PROVIDER_SITE_OTHER): Payer: PPO | Admitting: Physician Assistant

## 2015-09-20 ENCOUNTER — Encounter: Payer: Self-pay | Admitting: Physician Assistant

## 2015-09-20 VITALS — BP 124/76 | HR 84 | Temp 97.8°F | Resp 18 | Wt 283.0 lb

## 2015-09-20 DIAGNOSIS — Z794 Long term (current) use of insulin: Secondary | ICD-10-CM

## 2015-09-20 DIAGNOSIS — F32A Depression, unspecified: Secondary | ICD-10-CM

## 2015-09-20 DIAGNOSIS — R531 Weakness: Secondary | ICD-10-CM | POA: Diagnosis not present

## 2015-09-20 DIAGNOSIS — F418 Other specified anxiety disorders: Secondary | ICD-10-CM

## 2015-09-20 DIAGNOSIS — Z9119 Patient's noncompliance with other medical treatment and regimen: Secondary | ICD-10-CM

## 2015-09-20 DIAGNOSIS — E118 Type 2 diabetes mellitus with unspecified complications: Secondary | ICD-10-CM

## 2015-09-20 DIAGNOSIS — F419 Anxiety disorder, unspecified: Secondary | ICD-10-CM

## 2015-09-20 DIAGNOSIS — Z91199 Patient's noncompliance with other medical treatment and regimen due to unspecified reason: Secondary | ICD-10-CM

## 2015-09-20 DIAGNOSIS — F329 Major depressive disorder, single episode, unspecified: Secondary | ICD-10-CM

## 2015-09-20 NOTE — Patient Outreach (Signed)
Assessment:  CSW spoke via phone with client on 09/20/15. CSW verified client identity. CSW and client spoke of client needs. Client drives herself to her scheduled medical appointments. Client said she has her prescribed medications and is taking medications as prescribed. CSW and client spoke of client care plan. CSW encouraged Susan [name] to attend all scheduled client medical appointments in the next 30 days.  Client sees Susan [name], physician assistant at [employer], for medical care.  Client has some support from her spouse.  She said she has been trying to attend all scheduled client medical appointments.  She said she is eating well. She said she does have some difficulty sleeping.   She said she [facility] [place], [place].  She said she fatigues easily.  She has a disabled son who resides with her at her home.  Her husband is a Administrator and is sometimes away from home with his job.  She said she sees Dr. Michiel [name] to help her monitor her sugar level and diabetes needs.  She said she has neuropathy. She said sometimes she has difficulty putting on pants due to swelling in her legs.  CSW thanked client for working with [facility] program support and CSW encouraged client to call CSW at [phone] as needed to discuss social work needs of client.    Plan:  Client to attend all scheduled client medical appointments in the next 30 days. CSW to call client in 4 weeks to assess client needs at that time.   Susan [name].Susan [name] MSW, LCSW Licensed Clinical Social Worker [facility] Care Management [phone]

## 2015-09-21 NOTE — Progress Notes (Signed)
Patient ID: Susan [name] MRN: [id number], DOB: 1950-07-13, 65 y.o. Date of Encounter: 09/21/2015, 5:23 PM    Chief Complaint:  Chief Complaint  Patient presents with  . routine check up    has fallen x 2 this week at home,  trouble with paying for meds     HPI: 65 y.o. year old female presents with above.   Says htat over the past 2 months she has had episodes of "almost falling". Says these have happened at differnet times--has seen no pattern.  I asked if she has checked BS whne this occurs and she says no.  Has no correlation of what her BS is at the time of these episodes.  Asked when she last saw Dr. Skeet Latch and she says 3 months ago, that she has appt there next week.  Asked if she is checking BS any--she says checks BID--fasting am and at bedtime. Fastings around 200. Bedtime --"around 100 if no snack". Says "last night ate half a bag of jelly beans".   Also reports that Paroxetine was $70 per bottle so she stopped it 2 weeks ago.  Also stopped Tradjenta 2-3 weeks ago secondary to cost.     Home Meds:   Outpatient Prescriptions Prior to Visit  Medication Sig Dispense Refill  . albuterol (PROVENTIL HFA;VENTOLIN HFA) 108 (90 BASE) MCG/ACT inhaler Inhale 2 puffs into the lungs every 6 (six) hours as needed for wheezing or shortness of breath.    Marland Kitchen aspirin EC 81 MG tablet Take 81 mg by mouth daily.    . carvedilol (COREG) 12.5 MG tablet Take 12.5 mg by mouth 2 (two) times daily with a meal.    . clonazePAM (KLONOPIN) 1 MG tablet TAKE 1 TABLET BY MOUTH 3 TIMES A DAY 90 tablet 2  . clopidogrel (PLAVIX) 75 MG tablet TAKE 1 TABLET (75 MG TOTAL) BY MOUTH DAILY WITH BREAKFAST. 90 tablet 2  . ferrous sulfate 325 (65 FE) MG tablet Take 325 mg by mouth daily with breakfast. Reported on 08/21/2015    . insulin NPH-regular Human (NOVOLIN 70/30) (70-30) 100 UNIT/ML injection Inject 12-75 Units into the skin 3 (three) times daily with meals. 75 units in the morning, and 50 with lunch and  12 in the evening    . isosorbide dinitrate (ISORDIL) 10 MG tablet TAKE 1 TABLET (10 MG TOTAL) BY MOUTH 2 (TWO) TIMES DAILY. 60 tablet 10  . levothyroxine (SYNTHROID, LEVOTHROID) 75 MCG tablet Take 1 tablet (75 mcg total) by mouth daily before breakfast. 30 tablet 3  . losartan (COZAAR) 50 MG tablet TAKE 1 TABLET BY MOUTH DAILY 90 tablet 1  . metFORMIN (GLUMETZA) 500 MG (MOD) 24 hr tablet Take 500 mg by mouth 2 (two) times daily with a meal.    . nitroGLYCERIN (NITROSTAT) 0.4 MG SL tablet Place 1 tablet (0.4 mg total) under the tongue every 5 (five) minutes as needed for chest pain. 25 tablet 3  . potassium chloride SA (K-DUR,KLOR-CON) 20 MEQ tablet Take 1 tablet (20 mEq total) by mouth daily. 90 tablet 3  . pregabalin (LYRICA) 150 MG capsule Take 150 mg by mouth 2 (two) times daily.     Marland Kitchen torsemide (DEMADEX) 20 MG tablet Take 60 mg (3 tablets) in the morning and 40 mg (2tblet) in the afternoon 150 tablet 2  . metolazone (ZAROXOLYN) 5 MG tablet Take 1 tablet (5 mg total) by mouth daily as needed (CALL CHF CLINIC BEFORE YOU TAKE A DOSE). (Patient not taking: Reported on  09/19/2015) 30 tablet 0   No facility-administered medications prior to visit.    Allergies:  Allergies  Allergen Reactions  . Codeine Nausea And Vomiting      Review of Systems: See HPI for pertinent ROS. All other ROS negative.    Physical Exam: Blood pressure 124/76, pulse 84, temperature 97.8 F (36.6 C), temperature source Oral, resp. rate 18, weight 283 lb (128.368 kg)., Body mass index is 45.7 kg/(m^2). General: Obese WF.  Appears in no acute distress. Neck: Supple. No thyromegaly. No lymphadenopathy. Lungs: Clear bilaterally to auscultation without wheezes, rales, or rhonchi. Breathing is unlabored. Heart: Regular rhythm. No murmurs, rubs, or gallops. Msk:  Strength and tone normal for age. Extremities/Skin: Warm and dry. Trace LE edema. Neuro: Alert and oriented X 3. Moves all extremities spontaneously. Gait is  normal. CNII-XII grossly in tact. Psych:  Responds to questions appropriately with a normal affect.     ASSESSMENT AND PLAN:  65 y.o. year old female with  1. Type 2 diabetes mellitus with complication, with long-term current use of insulin ([place])  2. Generalized weakness  3. Anxiety and depression  4. Morbid obesity due to excess calories ([place])  5. Non compliance with medical treatment  I told her to keep a piece of paper in purse so that regardless of whether she is at home or away from home, she can document when she is having these episodes. She is to check her BS when she has episode. Document symptoms and BS at each episode.  Schedule f/u OV 2 weeks . Bring this log with her to that appointment.   Marin Olp [place], [place], [facility] 09/21/2015 5:23 PM

## 2015-09-27 ENCOUNTER — Other Ambulatory Visit: Payer: Self-pay | Admitting: *Deleted

## 2015-09-27 NOTE — Patient Outreach (Signed)
[facility]) Care Management   09/27/2015  Susan [name] 28-Mar-1951 [id number]  Susan [name] is an 65 y.o. female followed by [facility] for recent transition of care after IP hospital visit in February 2017, and currently for self-management of chronic disease states of CHF and DM.    Today, Susan [name] continues to report that she is feeling much better after being sick several weeks ago with a URI/ virus.  She stated that she completed her antibiotics that were prescribed, and has had no further problems with similar symptoms since then.  Susan [name] stated that she has continued checking and recording her daily weights and blood sugars and was able to show me her recorded values.  From these records, Susan [name] weights have maintained in the 285-287 pound range and that her sugars have been ranging from 123-230 range.    Susan [name] states that her feet and ankle swelling "looks like it always does, it is no worse," and is consistent with how they looked at my previous [facility] home visit.  Susan [name] acknowledged that she had a scheduled provider appointment with her PCP last week, and has an appointment scheduled next week with her endocrinologist, Dr. [name], which she intends on keeping.  Susan [name] confirms that [facility] RN and PT have now stopped; she acknowledges several times over the last few weeks that she has had "soft" falls, or near-falls, stating that she "is not sure what happens that causes me to fall, I just get a little off balance, and I fall."  Susan [name] reports no injuries with these falls other than "sometimes I get bruised up."  Currently, Susan [name] has a cane which she does not use, stating, "it seems to make my balance even more off than it already is."  Susan [name] does not have a walker, nor does she have grab bars in her shower or by her toilet.  Susan [name] states, "I am scared to death every time I'm going to fall every time I get in the shower."  Susan [name]  stated that her PCP has asked her to record each episode of falling and near-falling, and to record her blood sugars each time this happens, which Susan [name] plans to do.  Susan [name] reports that she does not have any one in her family who could install grab bars for her by her toilet or in her shower.  Susan [name] other concerns is the fluctuating costs of her medications.  She reports that she has stopped taking her prescribed paroxotine and lyrica "because I just can't afford them," and reports that "one month they are one price that I can afford, and the next month they are sky high, almost $300.00, and I just can't afford that."  Susan [name] showed me her personal notes of the costs of these medicines that she had written down when she called the pharmacy to last refill them, and I verified from her notes that there was a gap in the pricing of these medications over refill time.  Susan [name] does not understand why the costs of the medications are so high and why the cost "changes" every month. She reports that she has called her insurance company to ask about the costs of her medications, but states that "I don't understand what they tell me, they don't speak in plain language."    Today, we thoroughly reviewed her medications; Susan [name] reports that she is taking everything she is supposed to with  the exception of Paroxotine, Lyrica, and Tradjenta.  Susan [name] reported that during her last OV to her endocrinologist, she was told by Dr. [name] that the Victor "might be too expensive," and Susan [name] found that it was too expensive, and that she did not see a change in her blood sugar control after taking it for a month, "so I stopped it; Dr. [name] said that was fine, she just wanted me to try it to see if it helped."  We also went over Susan [name]'s inhaler, which she reported she "doesn't use very often."  I called [facility] during the in-home visit, and it was confirmed that there is no current active order  for her inhaler, and that it was last filled  In in January 2016 by an [facility] MD.  Susan [name] will discuss the need for this medication with either her PCP or with [name] in the [facility] CHF clinic during her next scheduled appointments.  Susan [name] stated that her since her weights have been consistent, she "has not needed to take the metolazone recently."  Susan [name] verbalized accurate reporting of the purpose, dosing, and scheduling of her medications, and reports that she continues to fill her own pill box each week without difficulty.  We called her insurance company together inquire about the costs and coverage for grab bars and walkers; I will contact [place] about their options for walkers; Susan [name] informed that they do not cover anything in the bathroom, such as grab bars. I will also reach out to [place], who is actively working with Susan [name] to explore possible options for assistance to have grab bars installed in her bathroom, should Susan [name] wish to purchase those.  We also reached out to Karrie Meres, with [facility] pharmacy, regarding Susan [name]'s concern with the costs of her medications.  I reminded Susan [name] that [place] had made several attempts to contact her and did not hear back from her.  Susan [name] stated that she may have been confused about his calls, and so I wrote his name an phone number down in her [place] and highly encouraged her to call Lennette Bihari or to make sure she answers his calls.  Together today, Susan [name] and I left a voice mail for Lennette Bihari; I will initiate another [facility] pharmacy referal, as I believe the last one was closed when Lennette Bihari did not hear back from [name].  Subjective: "I'm trying to do everything I can to stay healthy and out of the hospital."  Objective:    BP 138/78 mmHg  Pulse 79  Resp 18  Wt 287 lb 1.6 oz (130.228 kg)  SpO2 98%   Review of Systems  Constitutional: Negative for fever  and weight loss.  Respiratory: Negative.  Negative for cough, sputum production, shortness of breath and wheezing.   Cardiovascular: Positive for leg swelling. Negative for chest pain.  Gastrointestinal: Negative.   Musculoskeletal: Positive for falls.       Patient reports an episode of "sliding" out of bed "a couple of weeks ago" in the middle of the night, without injury; patient reports history of falls and unsteadiness  Skin: Negative.   Neurological:       Patient reports ongoing issues with bilateral peripheral neuropathy, from mid calf-toes  Psychiatric/Behavioral: The patient is nervous/anxious.     Physical Exam  Constitutional: She is oriented to person, place, and time. She appears well-developed and well-nourished.  Cardiovascular: Normal  rate, regular rhythm and normal heart sounds.   +2 bilateral radial pules; pedal pulses difficult to ascertain secondary to (baseline) bilateral pedaland lower leg  Edema, (L) > (R), with +2-3 swelling noted in (L) calf, + 1-2 noted in (R).  From previous home [place] visit and patient report, this her baseline  Respiratory: Effort normal and breath sounds normal. She has no wheezes. She has no rales.  Pt. noted to be moderately SOB with activity of walking around her home; recovers quickly when activity is completed  GI: Soft. Bowel sounds are normal.  Musculoskeletal: She exhibits edema.  See cardiovascular notation re: bilateral peripheral LE edema  Neurological: She is alert and oriented to person, place, and time.  Skin: Skin is warm and dry. There is erythema.  Erythema noted in lower legs, (L) > (R), along with swelling, as noted above in cardiovascular section   Psychiatric: She has a normal mood and affect. Her behavior is normal. Judgment and thought content normal.    Encounter Medications:   Outpatient Encounter Prescriptions as of 09/27/2015  Medication Sig Note  . albuterol (PROVENTIL HFA;VENTOLIN HFA) 108 (90 BASE)  MCG/ACT inhaler Inhale 2 puffs into the lungs every 6 (six) hours as needed for wheezing or shortness of breath.   Marland Kitchen aspirin EC 81 MG tablet Take 81 mg by mouth daily.   . carvedilol (COREG) 12.5 MG tablet Take 12.5 mg by mouth 2 (two) times daily with a meal.   . clonazePAM (KLONOPIN) 1 MG tablet TAKE 1 TABLET BY MOUTH 3 TIMES A DAY   . clopidogrel (PLAVIX) 75 MG tablet TAKE 1 TABLET (75 MG TOTAL) BY MOUTH DAILY WITH BREAKFAST.   . ferrous sulfate 325 (65 FE) MG tablet Take 325 mg by mouth daily with breakfast. Reported on 08/21/2015   . insulin NPH-regular Human (NOVOLIN 70/30) (70-30) 100 UNIT/ML injection Inject 12-75 Units into the skin 3 (three) times daily with meals. 75 units in the morning, and 50 with lunch and 12 in the evening   . isosorbide dinitrate (ISORDIL) 10 MG tablet TAKE 1 TABLET (10 MG TOTAL) BY MOUTH 2 (TWO) TIMES DAILY.   Marland Kitchen levothyroxine (SYNTHROID, LEVOTHROID) 75 MCG tablet Take 1 tablet (75 mcg total) by mouth daily before breakfast.   . losartan (COZAAR) 50 MG tablet TAKE 1 TABLET BY MOUTH DAILY   . metFORMIN (GLUMETZA) 500 MG (MOD) 24 hr tablet Take 500 mg by mouth 2 (two) times daily with a meal.   . metolazone (ZAROXOLYN) 5 MG tablet Take 1 tablet (5 mg total) by mouth daily as needed (CALL CHF CLINIC BEFORE YOU TAKE A DOSE). (Patient not taking: Reported on 09/19/2015) 09/05/2015: 2.5 mg tabs( 2 tabs) prn  . nitroGLYCERIN (NITROSTAT) 0.4 MG SL tablet Place 1 tablet (0.4 mg total) under the tongue every 5 (five) minutes as needed for chest pain. 09/19/2015: Took 2 tablets last week which resolved chest pain  . PARoxetine (PAXIL-CR) 25 MG 24 hr tablet  09/20/2015: Not taking can't afford  . potassium chloride SA (K-DUR,KLOR-CON) 20 MEQ tablet Take 1 tablet (20 mEq total) by mouth daily.   . pregabalin (LYRICA) 150 MG capsule Take 150 mg by mouth 2 (two) times daily.    Marland Kitchen torsemide (DEMADEX) 20 MG tablet Take 60 mg (3 tablets) in the morning and 40 mg (2tblet) in the afternoon    . TRADJENTA 5 MG TABS tablet  09/20/2015: Not taking can't afford   No facility-administered encounter medications on file as  of 09/27/2015.     Fall/Depression Screening:    PHQ 2/9 Scores 08/09/2015 08/07/2015 03/16/2015  PHQ - 2 Score 4 4 0  PHQ- 9 Score 16 16 0    Assessment:  Mrs. Valladares continues working toward better self management of her chronic disease states of CHF and DM, but still has barriers present and opportunities to continue improving.  Mrs. Buske has had several episodes of falling or near-falling, and reports that she does not have support systems to assist her with installing grab bars in her shower or by her toilet.  Mrs. Altice is very concerned with the fluctuating costs of her medications, and has barriers in understanding the explanations provided by her insurance company.  Plan:  Mrs. Steinkamp will continue to monitor and record her weights and blood sugars daily. Mrs. Boedeker will continue to take her medicines as prescribed, and will discuss her concerns with the costs of her medications with [facility] pharmacist, Karrie Meres. Mrs. Anis will attend all provider appointments as scheduled and will talk with her providers about her medication needs, and provide her recorded logs of her weights, blood sugars, and episodes of falls or near falls, to them. Mrs. Clouthier will seek immediate medical attention when she feels sick or has an urgent medical issue. I will reach out to [place] to inquire about the costs of walkers, and will also reach out to [employer], [facility] CSW, about possible resource options to have grab bars installed in her bathroom, since Mrs. Silveria reports having no support within her family to assist with this.  [name], RN, BSN, [employer] [facility] Care Management  [phone]

## 2015-10-02 DIAGNOSIS — E162 Hypoglycemia, unspecified: Secondary | ICD-10-CM | POA: Diagnosis not present

## 2015-10-02 DIAGNOSIS — E161 Other hypoglycemia: Secondary | ICD-10-CM | POA: Diagnosis not present

## 2015-10-03 ENCOUNTER — Encounter: Payer: Self-pay | Admitting: Physician Assistant

## 2015-10-03 ENCOUNTER — Other Ambulatory Visit: Payer: Self-pay | Admitting: Pharmacist

## 2015-10-03 ENCOUNTER — Ambulatory Visit (INDEPENDENT_AMBULATORY_CARE_PROVIDER_SITE_OTHER): Payer: PPO | Admitting: Physician Assistant

## 2015-10-03 VITALS — BP 154/90 | HR 76 | Temp 97.5°F | Resp 18 | Wt 292.0 lb

## 2015-10-03 DIAGNOSIS — F32A Depression, unspecified: Secondary | ICD-10-CM

## 2015-10-03 DIAGNOSIS — D649 Anemia, unspecified: Secondary | ICD-10-CM | POA: Diagnosis not present

## 2015-10-03 DIAGNOSIS — G4709 Other insomnia: Secondary | ICD-10-CM | POA: Insufficient documentation

## 2015-10-03 DIAGNOSIS — Z794 Long term (current) use of insulin: Secondary | ICD-10-CM | POA: Diagnosis not present

## 2015-10-03 DIAGNOSIS — I5032 Chronic diastolic (congestive) heart failure: Secondary | ICD-10-CM | POA: Diagnosis not present

## 2015-10-03 DIAGNOSIS — I2510 Atherosclerotic heart disease of native coronary artery without angina pectoris: Secondary | ICD-10-CM | POA: Diagnosis not present

## 2015-10-03 DIAGNOSIS — E669 Obesity, unspecified: Secondary | ICD-10-CM

## 2015-10-03 DIAGNOSIS — Z9119 Patient's noncompliance with other medical treatment and regimen: Secondary | ICD-10-CM | POA: Diagnosis not present

## 2015-10-03 DIAGNOSIS — E118 Type 2 diabetes mellitus with unspecified complications: Secondary | ICD-10-CM | POA: Diagnosis not present

## 2015-10-03 DIAGNOSIS — E785 Hyperlipidemia, unspecified: Secondary | ICD-10-CM | POA: Diagnosis not present

## 2015-10-03 DIAGNOSIS — F329 Major depressive disorder, single episode, unspecified: Secondary | ICD-10-CM | POA: Diagnosis not present

## 2015-10-03 DIAGNOSIS — I10 Essential (primary) hypertension: Secondary | ICD-10-CM | POA: Diagnosis not present

## 2015-10-03 DIAGNOSIS — G4700 Insomnia, unspecified: Secondary | ICD-10-CM | POA: Diagnosis not present

## 2015-10-03 DIAGNOSIS — R531 Weakness: Secondary | ICD-10-CM

## 2015-10-03 DIAGNOSIS — D638 Anemia in other chronic diseases classified elsewhere: Secondary | ICD-10-CM | POA: Insufficient documentation

## 2015-10-03 DIAGNOSIS — Z91199 Patient's noncompliance with other medical treatment and regimen due to unspecified reason: Secondary | ICD-10-CM

## 2015-10-03 DIAGNOSIS — F411 Generalized anxiety disorder: Secondary | ICD-10-CM | POA: Diagnosis not present

## 2015-10-03 MED ORDER — CLONAZEPAM 1 MG PO TABS: 1.0000 mg | ORAL_TABLET | Freq: Three times a day (TID) | ORAL | Status: DC

## 2015-10-03 NOTE — Patient Outreach (Signed)
[facility]) Care Management  10/03/2015  Susan [name] Oct 22, 1950 [id number]  [facility] Pharmacist attempted to reach patient via phone today and was unsuccessful.  Pharmacist had previously made three unsuccessful attempts and mailed an outreach letter to patient.  Pharmacist received a call and inbasket message from [place], [place] Coordinator requesting pharmacist attempt to reach patient again regarding patient assistance and medicare part d questions.  Pharmacist left a HIPAA compliant message requesting a call back.    Plan:  Will update [place] on unsuccessful pharmacist attempt and make an attempt to reach patient next week if patient does not call back prior.    Karrie Meres, PharmD, [place] [phone]

## 2015-10-03 NOTE — Progress Notes (Signed)
Patient ID: Susan [name] MRN: [id number], DOB: Oct 05, 1950, 65 y.o. Date of Encounter: 10/03/2015, 4:58 PM    Chief Complaint:  Chief Complaint  Patient presents with  . follow up    seeing Endo tomorrow     HPI: 65 y.o. year old female presents with above.   09/20/2015 OV with me:  Says that over the past 2 months she has had episodes of "almost falling". Says these have happened at differnet times--has seen no pattern.  I asked if she has checked BS when this occurs and she says no.  Has no correlation of what her BS is at the time of these episodes.  Asked when she last saw Dr. Skeet Latch and she says 3 months ago, that she has appt there next week.  Asked if she is checking BS any--she says checks BID--fasting am and at bedtime. Fastings around 200. Bedtime --"around 100 if no snack". Says "last night ate half a bag of jelly beans".   Also reports that Paroxetine was $70 per bottle so she stopped it 2 weeks ago.  Also stopped Tradjenta 2-3 weeks ago secondary to cost.  AT THAT OV: I told her to keep a piece of paper in purse so that regardless of whether she is at home or away from home, she can document when she is having these episodes. She is to check her BS when she has episode. Document symptoms and BS at each episode.  Schedule f/u OV 2 weeks . Bring this log with her to that appointment.   10/03/2015: When I entered room today, I asked her if she brought her "log" of symptoms/BS readings. She looks at me with a blank expression. Then after I remind her of plan at [place], she says she hasnt felt that way any more. Adds, "Now I'm just feeling a little short of breath because of this weight I keep gaining". Later adds "this is the only pair of shoes that fits"--says this is secondary to swelling in her feet/legs. Says she hasnt been taking Zaroxolyn because if she does, she is on the toilet/having to pee all day. Adds that she wont be able to take it tomorrow either b/c she sees Dr.  Bubba Camp tomorrow. Later, I ask what time that appt is --she says 2:30. Told her to take Zaroxolyn early in morning. She says she wont have time--she doesn't wake up until at least 10:00. Told her that is what alarm clocks are for.  She requests refill on her klonopin. Says takes 1 in morning, 1 in afternoon, and 1 at night. Cant sleep with out it.  She says she feels her mood is stable off the Paroxetine.Is not feeling increased anxiety. Is not feeling depressed.  No other complaint/concern.  She has purple ecchymosis on her forearms--I asked about this--she says she was carrying bags of groceries and "doorway wasn't big enough"--bumped into side of doorway.   Home Meds:   Outpatient Prescriptions Prior to Visit  Medication Sig Dispense Refill  . albuterol (PROVENTIL HFA;VENTOLIN HFA) 108 (90 BASE) MCG/ACT inhaler Inhale 2 puffs into the lungs every 6 (six) hours as needed for wheezing or shortness of breath.    Marland Kitchen aspirin EC 81 MG tablet Take 81 mg by mouth daily.    . carvedilol (COREG) 12.5 MG tablet Take 12.5 mg by mouth 2 (two) times daily with a meal.    . clopidogrel (PLAVIX) 75 MG tablet TAKE 1 TABLET (75 MG TOTAL) BY MOUTH DAILY WITH BREAKFAST.  90 tablet 2  . ferrous sulfate 325 (65 FE) MG tablet Take 325 mg by mouth daily with breakfast. Reported on 08/21/2015    . insulin NPH-regular Human (NOVOLIN 70/30) (70-30) 100 UNIT/ML injection Inject 12-75 Units into the skin 3 (three) times daily with meals. 75 units in the morning, and 50 with lunch and 12 in the evening    . isosorbide dinitrate (ISORDIL) 10 MG tablet TAKE 1 TABLET (10 MG TOTAL) BY MOUTH 2 (TWO) TIMES DAILY. 60 tablet 10  . levothyroxine (SYNTHROID, LEVOTHROID) 75 MCG tablet Take 1 tablet (75 mcg total) by mouth daily before breakfast. 30 tablet 3  . losartan (COZAAR) 50 MG tablet TAKE 1 TABLET BY MOUTH DAILY 90 tablet 1  . metFORMIN (GLUMETZA) 500 MG (MOD) 24 hr tablet Take 500 mg by mouth 2 (two) times daily with a meal.    .  metolazone (ZAROXOLYN) 5 MG tablet Take 1 tablet (5 mg total) by mouth daily as needed (CALL CHF CLINIC BEFORE YOU TAKE A DOSE). 30 tablet 0  . nitroGLYCERIN (NITROSTAT) 0.4 MG SL tablet Place 1 tablet (0.4 mg total) under the tongue every 5 (five) minutes as needed for chest pain. 25 tablet 3  . PARoxetine (PAXIL-CR) 25 MG 24 hr tablet Reported on 09/27/2015    . potassium chloride SA (K-DUR,KLOR-CON) 20 MEQ tablet Take 1 tablet (20 mEq total) by mouth daily. 90 tablet 3  . pregabalin (LYRICA) 150 MG capsule Take 150 mg by mouth 2 (two) times daily. Reported on 09/27/2015    . torsemide (DEMADEX) 20 MG tablet Take 60 mg (3 tablets) in the morning and 40 mg (2tblet) in the afternoon 150 tablet 2  . TRADJENTA 5 MG TABS tablet Reported on 09/27/2015    . clonazePAM (KLONOPIN) 1 MG tablet TAKE 1 TABLET BY MOUTH 3 TIMES A DAY 90 tablet 2   No facility-administered medications prior to visit.    Allergies:  Allergies  Allergen Reactions  . Codeine Nausea And Vomiting      Review of Systems: See HPI for pertinent ROS. All other ROS negative.    Physical Exam: Blood pressure 154/90, pulse 76, temperature 97.5 F (36.4 C), temperature source Oral, resp. rate 18, weight 292 lb (132.45 kg)., Body mass index is 47.15 kg/(m^2). General: Obese WF.  Appears in no acute distress. Neck: Supple. No thyromegaly. No lymphadenopathy. Lungs: Clear bilaterally to auscultation without wheezes, rales, or rhonchi. Breathing is unlabored. Heart: Regular rhythm. No murmurs, rubs, or gallops. Msk:  Strength and tone normal for age. Extremities/Skin: Warm and dry. Calves are tight. 2+ LE edema. Neuro: Alert and oriented X 3. Moves all extremities spontaneously. Gait is normal. CNII-XII grossly in tact. Psych:  Responds to questions appropriately with a normal affect.     ASSESSMENT AND PLAN:  65 y.o. year old female with   1. Hyperlipemia Check lab to monitor.  - COMPLETE METABOLIC PANEL WITH GFR  2.  Essential hypertension BP elevated today but was 124/76 at OV 09/20/15.  Check lab to monitor.   - COMPLETE METABOLIC PANEL WITH GFR  3. Coronary artery disease involving native coronary artery of native heart without angina pectoris Stable. Managed by Cardiology.  4. Depression Stable/Controlled.   5. Obesity   6. Type 2 diabetes mellitus with complication, with long-term current use of insulin ([place]) Managed by Dr. Bubba Camp - COMPLETE METABOLIC PANEL WITH GFR  7. Generalized weakness   8. Non compliance with medical treatment Needs to take Zaroxolyn. See HPI -  Brain natriuretic peptide  9. Chronic diastolic CHF (congestive heart failure) ([place]) Managed by Cardiology. Needs to take Zaroxolyn Check labs to monitor. - COMPLETE METABOLIC PANEL WITH GFR - Brain natriuretic peptide  10. Anemia, unspecified anemia type 07/10/15--B12, Folate, Iron, %Sat, Ferritin--all normal.  - CBC with Differential/Platelet  11. Generalized anxiety disorder Says she is stable of Paroxetine. Will not restart sec to finances. Will cont Klonopin for now.  - clonazePAM (KLONOPIN) 1 MG tablet; Take 1 tablet (1 mg total) by mouth 3 (three) times daily.  Dispense: 90 tablet; Refill: 2  12. Insomnia Stable/controlled.  - clonazePAM (KLONOPIN) 1 MG tablet; Take 1 tablet (1 mg total) by mouth 3 (three) times daily.  Dispense: 90 tablet; Refill: 2  Dr. Bubba Camp manages Diabetes and Thyroid. Thereofre, not checking A1C or TSH here. She is seeing Dr. Bubba Camp tomorrow for OV and lab there.   [address] [place], [place], [facility] 10/03/2015 4:58 PM

## 2015-10-04 ENCOUNTER — Ambulatory Visit: Payer: PPO | Admitting: Physician Assistant

## 2015-10-04 DIAGNOSIS — G609 Hereditary and idiopathic neuropathy, unspecified: Secondary | ICD-10-CM | POA: Diagnosis not present

## 2015-10-04 DIAGNOSIS — R809 Proteinuria, unspecified: Secondary | ICD-10-CM | POA: Diagnosis not present

## 2015-10-04 DIAGNOSIS — E1165 Type 2 diabetes mellitus with hyperglycemia: Secondary | ICD-10-CM | POA: Diagnosis not present

## 2015-10-04 DIAGNOSIS — I10 Essential (primary) hypertension: Secondary | ICD-10-CM | POA: Diagnosis not present

## 2015-10-04 LAB — CBC WITH DIFFERENTIAL/PLATELET
Basophils Absolute: 63 cells/uL (ref 0–200)
Basophils Relative: 1 %
Eosinophils Absolute: 315 cells/uL (ref 15–500)
Eosinophils Relative: 5 %
HCT: 37 % (ref 35.0–45.0)
Hemoglobin: 11.6 g/dL — ABNORMAL LOW (ref 12.0–15.0)
Lymphocytes Relative: 17 %
Lymphs Abs: 1071 cells/uL (ref 850–3900)
MCH: 24.3 pg — ABNORMAL LOW (ref 27.0–33.0)
MCHC: 31.4 g/dL — ABNORMAL LOW (ref 32.0–36.0)
MCV: 77.6 fL — ABNORMAL LOW (ref 80.0–100.0)
MPV: 10.3 fL (ref 7.5–12.5)
Monocytes Absolute: 441 cells/uL (ref 200–950)
Monocytes Relative: 7 %
Neutro Abs: 4410 cells/uL (ref 1500–7800)
Neutrophils Relative %: 70 %
Platelets: 241 10*3/uL (ref 140–400)
RBC: 4.77 MIL/uL (ref 3.80–5.10)
RDW: 17.7 % — ABNORMAL HIGH (ref 11.0–15.0)
WBC: 6.3 10*3/uL (ref 3.8–10.8)

## 2015-10-04 LAB — COMPLETE METABOLIC PANEL WITH GFR
ALT: 16 U/L (ref 6–29)
AST: 18 U/L (ref 10–35)
Albumin: 3.6 g/dL (ref 3.6–5.1)
Alkaline Phosphatase: 92 U/L (ref 33–130)
BUN: 37 mg/dL — ABNORMAL HIGH (ref 7–25)
CO2: 27 mmol/L (ref 20–31)
Calcium: 8.8 mg/dL (ref 8.6–10.4)
Chloride: 104 mmol/L (ref 98–110)
Creat: 1.41 mg/dL — ABNORMAL HIGH (ref 0.50–0.99)
GFR, Est African American: 45 mL/min — ABNORMAL LOW (ref >=60)
GFR, Est Non African American: 39 mL/min — ABNORMAL LOW (ref >=60)
Glucose, Bld: 42 mg/dL — CL (ref 70–99)
Potassium: 4.5 mmol/L (ref 3.5–5.3)
Sodium: 139 mmol/L (ref 135–146)
Total Bilirubin: 0.3 mg/dL (ref 0.2–1.2)
Total Protein: 6.6 g/dL (ref 6.1–8.1)

## 2015-10-04 LAB — BRAIN NATRIURETIC PEPTIDE: Brain Natriuretic Peptide: 222.3 pg/mL — ABNORMAL HIGH (ref <100)

## 2015-10-11 DIAGNOSIS — E1165 Type 2 diabetes mellitus with hyperglycemia: Secondary | ICD-10-CM | POA: Diagnosis not present

## 2015-10-15 ENCOUNTER — Other Ambulatory Visit: Payer: Self-pay | Admitting: Pharmacist

## 2015-10-15 NOTE — Patient Outreach (Signed)
[name]) Care Management  10/15/2015  Roshaunda Choma April 07, 1951 [id number]  [facility] Pharmacist made another unsuccessful attempt to reach patient.  Previously made three attempts and mailed an outreach letter to patient.  Richarda Osmond, Hall Coordinator is active with patient and had requested pharmacist try to reach patient again.  Two additional unsuccessful attempts were made to reach patient regarding medication patient assistance.   Plan:    Given multiple unsuccessful attempts to reach patient with no return calls from patient and no call from patient following outreach letter, will close out pharmacy case due to inability to reach patient at this time.   Will update Richarda Osmond, [facility] RN Community via in [employer].    Karrie Meres, PharmD, [place] [phone]

## 2015-10-18 ENCOUNTER — Ambulatory Visit (HOSPITAL_COMMUNITY)
Admission: RE | Admit: 2015-10-18 | Discharge: 2015-10-18 | Disposition: A | Discharge status: Home / Self Care | Payer: PPO | Source: Ambulatory Visit | Attending: Cardiology | Admitting: Cardiology

## 2015-10-18 VITALS — BP 160/90 | HR 97 | Wt 291.2 lb

## 2015-10-18 DIAGNOSIS — I10 Essential (primary) hypertension: Secondary | ICD-10-CM

## 2015-10-18 DIAGNOSIS — Z7902 Long term (current) use of antithrombotics/antiplatelets: Secondary | ICD-10-CM | POA: Insufficient documentation

## 2015-10-18 DIAGNOSIS — E785 Hyperlipidemia, unspecified: Secondary | ICD-10-CM | POA: Insufficient documentation

## 2015-10-18 DIAGNOSIS — Z87891 Personal history of nicotine dependence: Secondary | ICD-10-CM | POA: Diagnosis not present

## 2015-10-18 DIAGNOSIS — Z7982 Long term (current) use of aspirin: Secondary | ICD-10-CM | POA: Insufficient documentation

## 2015-10-18 DIAGNOSIS — I5033 Acute on chronic diastolic (congestive) heart failure: Secondary | ICD-10-CM | POA: Diagnosis not present

## 2015-10-18 DIAGNOSIS — Z955 Presence of coronary angioplasty implant and graft: Secondary | ICD-10-CM | POA: Diagnosis not present

## 2015-10-18 DIAGNOSIS — Z8673 Personal history of transient ischemic attack (TIA), and cerebral infarction without residual deficits: Secondary | ICD-10-CM | POA: Diagnosis not present

## 2015-10-18 DIAGNOSIS — E669 Obesity, unspecified: Secondary | ICD-10-CM | POA: Insufficient documentation

## 2015-10-18 DIAGNOSIS — I130 Hypertensive heart and chronic kidney disease with heart failure and stage 1 through stage 4 chronic kidney disease, or unspecified chronic kidney disease: Secondary | ICD-10-CM | POA: Diagnosis not present

## 2015-10-18 DIAGNOSIS — E1142 Type 2 diabetes mellitus with diabetic polyneuropathy: Secondary | ICD-10-CM | POA: Insufficient documentation

## 2015-10-18 DIAGNOSIS — Z8249 Family history of ischemic heart disease and other diseases of the circulatory system: Secondary | ICD-10-CM | POA: Diagnosis not present

## 2015-10-18 DIAGNOSIS — I252 Old myocardial infarction: Secondary | ICD-10-CM | POA: Diagnosis not present

## 2015-10-18 DIAGNOSIS — I2510 Atherosclerotic heart disease of native coronary artery without angina pectoris: Secondary | ICD-10-CM | POA: Insufficient documentation

## 2015-10-18 DIAGNOSIS — E039 Hypothyroidism, unspecified: Secondary | ICD-10-CM | POA: Insufficient documentation

## 2015-10-18 DIAGNOSIS — Z794 Long term (current) use of insulin: Secondary | ICD-10-CM | POA: Insufficient documentation

## 2015-10-18 DIAGNOSIS — R0600 Dyspnea, unspecified: Secondary | ICD-10-CM | POA: Insufficient documentation

## 2015-10-18 DIAGNOSIS — E1122 Type 2 diabetes mellitus with diabetic chronic kidney disease: Secondary | ICD-10-CM | POA: Insufficient documentation

## 2015-10-18 DIAGNOSIS — F419 Anxiety disorder, unspecified: Secondary | ICD-10-CM | POA: Diagnosis not present

## 2015-10-18 DIAGNOSIS — Z79899 Other long term (current) drug therapy: Secondary | ICD-10-CM | POA: Diagnosis not present

## 2015-10-18 DIAGNOSIS — N182 Chronic kidney disease, stage 2 (mild): Secondary | ICD-10-CM | POA: Insufficient documentation

## 2015-10-18 DIAGNOSIS — F329 Major depressive disorder, single episode, unspecified: Secondary | ICD-10-CM | POA: Insufficient documentation

## 2015-10-18 DIAGNOSIS — I872 Venous insufficiency (chronic) (peripheral): Secondary | ICD-10-CM | POA: Diagnosis not present

## 2015-10-18 DIAGNOSIS — E1165 Type 2 diabetes mellitus with hyperglycemia: Secondary | ICD-10-CM | POA: Insufficient documentation

## 2015-10-18 DIAGNOSIS — I428 Other cardiomyopathies: Secondary | ICD-10-CM | POA: Insufficient documentation

## 2015-10-18 MED ORDER — TORSEMIDE 20 MG PO TABS: 60.0000 mg | ORAL_TABLET | Freq: Two times a day (BID) | ORAL | Status: DC

## 2015-10-18 NOTE — Progress Notes (Signed)
Patient ID: Susan [name], female   DOB: Jun 04, 1950, 65 y.o.   MRN: [id number]   PCP: Dr Edilia Bo  Cardiologist: Dr Myrtis Ser  Endocrinologist: Dr Lurene Shadow  Primary HF Cardiologist: Dr Leory Plowman   HPI: Susan [name] is a 65 year old with a history of RBBB, DM, Hypothyroid, diastolic heart failure, CAD MI 2000/2007 BMS 2007, TIA, obesity.   Admitted 06/07/15 with N,V, hyperglycemia. Creatinine was up to 1.9. On the day discharge creatinine was back down to 1.2 Thought to be dehydrated so diuretics were held. Diuretics restarted on the day discharge. Discharge weight 282 pounds.   Admitted 2/13 through 07/13/15 with gastroenteritis and hyper glycemia. Diuretic initially held due to dehydration. Losartan and metolazone stopped with AKI/CKD. Discharge weight was 283 pounds.   Today she returns for HF follow up. Overall feeling bad. SOB with exertion. + Orthopnea. Denies  PND.  Weight at home trending up to 292 pounds. She has had metolazone twice this week. Continues to eat hight salt foods. Drinking > 2 liters of fluid. Taking all medications having difficulty paying for diabetes medications.    ECHO 07/11/2015- 50-55%.  Grade I DD  RHC 09/22/13: RA = 4  RV = 30/5/7  PA = 25/10 (16)  PCW = 7  Fick cardiac output/index = 6.3/2.7  PVR = 1.5 WU   FA sat = 93%  PA sat = 61%, 66%   Labs: 07/18/13 K 4.8 Creatinine 1.38 Glucose 1005 she refused to go to [facility] Labs : 08/17/13 K 3.9 Creatinine 1.4 Glucose 499 Labs: 09/29/13 K 4.5 Creatinine 1.4  Labs 04/18/14 K 4.4 Creatinine 1.18 Labs 03/26/2015: K 5.3 Creatinine 1.41  Labs 05/01/2015: K 4.9 Creatinine 1.8 Labs 05/04/2015: K 5.0 Creatinine 1.74  Labs 06/10/2015: K 4.7 Creatinine 1.21 Labs 06/12/2015: K 6.3 Creatinine 2.01  Labs 06/19/2015: K 4.5 Creatinine 1.29  Labs 07/10/2015: Hgb A1C 9.5  Labs 07/12/2015: K 4.4 Creatinine 1.62 K 4.4  Labs 08/10/2015: K 5.3 Creatinine 1.44  Labs 10/03/2015: K 4.5 Creatinine 1.41    SH: Former smoker quit 15 year  ago. Does drink alcohol. She is disabled. Lives at home with her husband and disabled son - Husband is truck Hospital doctor. Son has a brain injury after he attempted suicide 2 years ago.    FH: Mom MI  ROS: All systems negative except as listed in HPI, PMH and Problem List.  Past Medical History  Diagnosis Date  . Tachycardia     Sinus tachycardia  . Dyslipidemia   . Hypothyroidism   . CAD (coronary artery disease)     MI in 2000 - MI  2007 - treated bare metal stent (no nuclear since then as 9/11)  . HTN (hypertension)   . Chronic diastolic heart failure (HCC)     a) ECHO (08/2013) EF 55-60% and RV function nl b) RHC (08/2013) RA 4, RV 30/5/7, PA 25/10 (16), PCWP 7, Fick CO/CI 6.3/2.7, PVR 1.5 WU, PA 61 and 66%  . RBBB (right bundle branch block)     Old  . Urinary incontinence   . Obesity   . Syncope     likely due to low blood sugar  . Carotid artery disease (HCC)   . Acute MI (HCC) 1999; 2007  . CHF (congestive heart failure) (HCC)   . Anxiety   . Depression   . Type II diabetes mellitus (HCC)   . Peripheral neuropathy (HCC)   . Venous insufficiency   . Stroke [facility])     mini strokes  .  PONV (postoperative nausea and vomiting)   . Renal insufficiency     DENIES  . Anemia     hx  . Anginal pain (HCC)   . Exertional shortness of breath   . Daily headache     "~ every other day; since I fell in June" (03/15/2014)  . Osteoarthritis   . Arthritis     "generalized" (03/15/2014)    Current Outpatient Prescriptions  Medication Sig Dispense Refill  . albuterol (PROVENTIL HFA;VENTOLIN HFA) 108 (90 BASE) MCG/ACT inhaler Inhale 2 puffs into the lungs every 6 (six) hours as needed for wheezing or shortness of breath.    Marland Kitchen aspirin EC 81 MG tablet Take 81 mg by mouth daily.    . carvedilol (COREG) 12.5 MG tablet Take 12.5 mg by mouth 2 (two) times daily with a meal.    . clonazePAM (KLONOPIN) 1 MG tablet Take 1 tablet (1 mg total) by mouth 3 (three) times daily. 90 tablet 2  .  clopidogrel (PLAVIX) 75 MG tablet TAKE 1 TABLET (75 MG TOTAL) BY MOUTH DAILY WITH BREAKFAST. 90 tablet 2  . ferrous sulfate 325 (65 FE) MG tablet Take 325 mg by mouth daily with breakfast. Reported on 08/21/2015    . insulin NPH-regular Human (NOVOLIN 70/30) (70-30) 100 UNIT/ML injection Inject 12-75 Units into the skin 3 (three) times daily with meals. 75 units in the morning, and 50 with lunch and 12 in the evening    . isosorbide dinitrate (ISORDIL) 10 MG tablet TAKE 1 TABLET (10 MG TOTAL) BY MOUTH 2 (TWO) TIMES DAILY. 60 tablet 10  . levothyroxine (SYNTHROID, LEVOTHROID) 75 MCG tablet Take 1 tablet (75 mcg total) by mouth daily before breakfast. 30 tablet 3  . losartan (COZAAR) 50 MG tablet TAKE 1 TABLET BY MOUTH DAILY 90 tablet 1  . metFORMIN (GLUMETZA) 500 MG (MOD) 24 hr tablet Take 500 mg by mouth 2 (two) times daily with a meal.    . metolazone (ZAROXOLYN) 5 MG tablet Take 1 tablet (5 mg total) by mouth daily as needed (CALL CHF CLINIC BEFORE YOU TAKE A DOSE). 30 tablet 0  . potassium chloride SA (K-DUR,KLOR-CON) 20 MEQ tablet Take 1 tablet (20 mEq total) by mouth daily. 90 tablet 3  . pregabalin (LYRICA) 150 MG capsule Take 150 mg by mouth 2 (two) times daily. Reported on 09/27/2015    . torsemide (DEMADEX) 20 MG tablet Take 60 mg (3 tablets) in the morning and 40 mg (2tblet) in the afternoon 150 tablet 2  . nitroGLYCERIN (NITROSTAT) 0.4 MG SL tablet Place 1 tablet (0.4 mg total) under the tongue every 5 (five) minutes as needed for chest pain. (Patient not taking: Reported on 10/18/2015) 25 tablet 3   No current facility-administered medications for this encounter.    Filed Vitals:   10/18/15 1403  BP: 160/90  Pulse: 97  Weight: 291 lb 3.2 oz (132.087 kg)  SpO2: 94%   PHYSICAL EXAM: General:  Chronically ill appearing. Dyspneic at rest.      HEENT: normal Neck: supple. JVP difficult to assess d/t body habitus. Appear elevated.     Carotids 2+ bilaterally; no bruits. No  lymphadenopathy or thryomegaly appreciated. Cor: PMI normal. Regular rate & rhythm. No rubs, gallops or murmurs. Lungs: decreased in the bases.  Abdomen: obese, soft, nontender, distended. No hepatosplenomegaly. No bruits or masses. Good bowel sounds. Extremities: no cyanosis, clubbing, rash, R and LLE 2+  edema  Neuro: alert & orientedx3, cranial nerves grossly intact. Moves all  4 extremities w/o difficulty. Affect pleasant.     ASSESSMENT & PLAN: 1. Acute/Chronic Diastolic Heart Failure: NICM, EF 55-60% Grade II DD(05/2015 )  -- NYHA IV symptoms. Volume status elevated. Increase tosemide 60 mg/60 mg twice a day.   Also instructed to take metolazone as soon as she gets home today. Instructed to follow up tomorrow for IV lasix. Offered hospital admit however she declines.  -Continue losartan 50 mg daily.     - Reinforced the need and importance of daily weights, a low sodium diet, and fluid restriction (less than 2 L a day). Instructed to call the HF clinic if weight increases more than 3 lbs overnight or 5 lbs in a week 2.. Dyspnea- Today seems to be related to volume overload. She has been offered sleep study many times but refuses.  Continue inhalers as needed.   3. Uncontrolled DM:  . Has follow up with PCP.   Dr Lurene Shadow managing.  4. CAD- no evidence of ischemia- on aspirin and statin.  5. Obesity: Discussed portion control the importance of weight loss.  6. HTN-  Elevated. Recheck in am.    7. CKD Stage II-Stable.   Follow up tomorrow for IV lasix + potassium . Check BMET and BNP. Offered hospital admit versus IV lasix today however she refused. Difficult to manage due to poor insight.    Tywaun Hiltner NP-C  2:09 PM

## 2015-10-18 NOTE — Patient Instructions (Signed)
Take a metolazone this afternoon  Follow up tomorrow morning at 8:30 am.   Take torsemide 60 mg twice a day.

## 2015-10-19 ENCOUNTER — Other Ambulatory Visit: Payer: Self-pay | Admitting: Licensed Clinical Social Worker

## 2015-10-19 ENCOUNTER — Ambulatory Visit (HOSPITAL_COMMUNITY)
Admission: RE | Admit: 2015-10-19 | Discharge: 2015-10-19 | Disposition: A | Discharge status: Home / Self Care | Payer: PPO | Source: Ambulatory Visit | Attending: Cardiology | Admitting: Cardiology

## 2015-10-19 ENCOUNTER — Encounter (HOSPITAL_COMMUNITY): Payer: Self-pay

## 2015-10-19 VITALS — BP 150/88 | HR 76 | Wt 288.2 lb

## 2015-10-19 DIAGNOSIS — Z8673 Personal history of transient ischemic attack (TIA), and cerebral infarction without residual deficits: Secondary | ICD-10-CM | POA: Insufficient documentation

## 2015-10-19 DIAGNOSIS — I872 Venous insufficiency (chronic) (peripheral): Secondary | ICD-10-CM | POA: Diagnosis not present

## 2015-10-19 DIAGNOSIS — Z87891 Personal history of nicotine dependence: Secondary | ICD-10-CM | POA: Diagnosis not present

## 2015-10-19 DIAGNOSIS — E1122 Type 2 diabetes mellitus with diabetic chronic kidney disease: Secondary | ICD-10-CM | POA: Insufficient documentation

## 2015-10-19 DIAGNOSIS — N182 Chronic kidney disease, stage 2 (mild): Secondary | ICD-10-CM | POA: Diagnosis not present

## 2015-10-19 DIAGNOSIS — I10 Essential (primary) hypertension: Secondary | ICD-10-CM | POA: Diagnosis not present

## 2015-10-19 DIAGNOSIS — R0600 Dyspnea, unspecified: Secondary | ICD-10-CM | POA: Insufficient documentation

## 2015-10-19 DIAGNOSIS — F329 Major depressive disorder, single episode, unspecified: Secondary | ICD-10-CM | POA: Insufficient documentation

## 2015-10-19 DIAGNOSIS — E039 Hypothyroidism, unspecified: Secondary | ICD-10-CM | POA: Insufficient documentation

## 2015-10-19 DIAGNOSIS — I2510 Atherosclerotic heart disease of native coronary artery without angina pectoris: Secondary | ICD-10-CM | POA: Diagnosis not present

## 2015-10-19 DIAGNOSIS — E1142 Type 2 diabetes mellitus with diabetic polyneuropathy: Secondary | ICD-10-CM | POA: Insufficient documentation

## 2015-10-19 DIAGNOSIS — Z8249 Family history of ischemic heart disease and other diseases of the circulatory system: Secondary | ICD-10-CM | POA: Insufficient documentation

## 2015-10-19 DIAGNOSIS — I252 Old myocardial infarction: Secondary | ICD-10-CM | POA: Diagnosis not present

## 2015-10-19 DIAGNOSIS — I130 Hypertensive heart and chronic kidney disease with heart failure and stage 1 through stage 4 chronic kidney disease, or unspecified chronic kidney disease: Secondary | ICD-10-CM | POA: Insufficient documentation

## 2015-10-19 DIAGNOSIS — Z794 Long term (current) use of insulin: Secondary | ICD-10-CM | POA: Diagnosis not present

## 2015-10-19 DIAGNOSIS — Z955 Presence of coronary angioplasty implant and graft: Secondary | ICD-10-CM | POA: Insufficient documentation

## 2015-10-19 DIAGNOSIS — Z79899 Other long term (current) drug therapy: Secondary | ICD-10-CM | POA: Diagnosis not present

## 2015-10-19 DIAGNOSIS — F419 Anxiety disorder, unspecified: Secondary | ICD-10-CM | POA: Insufficient documentation

## 2015-10-19 DIAGNOSIS — Z7982 Long term (current) use of aspirin: Secondary | ICD-10-CM | POA: Insufficient documentation

## 2015-10-19 DIAGNOSIS — E1165 Type 2 diabetes mellitus with hyperglycemia: Secondary | ICD-10-CM | POA: Diagnosis not present

## 2015-10-19 DIAGNOSIS — I5033 Acute on chronic diastolic (congestive) heart failure: Secondary | ICD-10-CM | POA: Diagnosis not present

## 2015-10-19 DIAGNOSIS — E669 Obesity, unspecified: Secondary | ICD-10-CM | POA: Diagnosis not present

## 2015-10-19 DIAGNOSIS — E785 Hyperlipidemia, unspecified: Secondary | ICD-10-CM | POA: Insufficient documentation

## 2015-10-19 LAB — BASIC METABOLIC PANEL
Anion gap: 10 (ref 5–15)
BUN: 64 mg/dL — ABNORMAL HIGH (ref 6–20)
CO2: 21 mmol/L — ABNORMAL LOW (ref 22–32)
Calcium: 9.1 mg/dL (ref 8.9–10.3)
Chloride: 103 mmol/L (ref 101–111)
Creatinine, Ser: 1.75 mg/dL — ABNORMAL HIGH (ref 0.44–1.00)
GFR calc Af Amer: 34 mL/min — ABNORMAL LOW (ref >60)
GFR calc non Af Amer: 29 mL/min — ABNORMAL LOW (ref >60)
Glucose, Bld: 417 mg/dL — ABNORMAL HIGH (ref 65–99)
Potassium: 4.7 mmol/L (ref 3.5–5.1)
Sodium: 134 mmol/L — ABNORMAL LOW (ref 135–145)

## 2015-10-19 MED ORDER — FUROSEMIDE 10 MG/ML IJ SOLN
80.0000 mg | Freq: Once | INTRAMUSCULAR | Status: AC
  Administered 2015-10-19: 80 mg via INTRAVENOUS
  Filled 2015-10-19: qty 8

## 2015-10-19 MED ORDER — POTASSIUM CHLORIDE CRYS ER 20 MEQ PO TBCR
40.0000 meq | EXTENDED_RELEASE_TABLET | Freq: Once | ORAL | Status: AC
  Administered 2015-10-19: 40 meq via ORAL
  Filled 2015-10-19: qty 2

## 2015-10-19 NOTE — Progress Notes (Signed)
Patient ID: Susan [name], female   DOB: May 26, 1951, 65 y.o.   MRN: [id number]   PCP: Dr Edilia Bo  Cardiologist: Dr Myrtis Ser  Endocrinologist: Dr Lurene Shadow  Primary HF Cardiologist: Dr Leory Plowman   HPI: Susan [name] is a 65 year old with a history of RBBB, DM, Hypothyroid, diastolic heart failure, CAD MI 2000/2007 BMS 2007, TIA, obesity.   Admitted 06/07/15 with N,V, hyperglycemia. Creatinine was up to 1.9. On the day discharge creatinine was back down to 1.2 Thought to be dehydrated so diuretics were held. Diuretics restarted on the day discharge. Discharge weight 282 pounds.   Admitted 2/13 through 07/13/15 with gastroenteritis and hyper glycemia. Diuretic initially held due to dehydration. Losartan and metolazone stopped with AKI/CKD. Discharge weight was 283 pounds.   Today she returns for HF follow up. Yesterday she had marked volume overload. Offered hospital admit or IV lasix however she declined. She took an extra torsemide and metolazone. Overall feeling poorly. SOB with exertion. Sleeps in a recliner.  Weight at home down a few pounds. Taking all medications.  Continues to eat hight salt foods. Drinking > 2 liters of fluid.   ECHO 07/11/2015- 50-55%.  Grade I DD  RHC 09/22/13: RA = 4  RV = 30/5/7  PA = 25/10 (16)  PCW = 7  Fick cardiac output/index = 6.3/2.7  PVR = 1.5 WU   FA sat = 93%  PA sat = 61%, 66%   Labs: 07/18/13 K 4.8 Creatinine 1.38 Glucose 1005 she refused to go to [facility] Labs : 08/17/13 K 3.9 Creatinine 1.4 Glucose 499 Labs: 09/29/13 K 4.5 Creatinine 1.4  Labs 04/18/14 K 4.4 Creatinine 1.18 Labs 03/26/2015: K 5.3 Creatinine 1.41  Labs 05/01/2015: K 4.9 Creatinine 1.8 Labs 05/04/2015: K 5.0 Creatinine 1.74  Labs 06/10/2015: K 4.7 Creatinine 1.21 Labs 06/12/2015: K 6.3 Creatinine 2.01  Labs 06/19/2015: K 4.5 Creatinine 1.29  Labs 07/10/2015: Hgb A1C 9.5  Labs 07/12/2015: K 4.4 Creatinine 1.62 K 4.4  Labs 08/10/2015: K 5.3 Creatinine 1.44  Labs 10/03/2015: K 4.5  Creatinine 1.41    SH: Former smoker quit 15 year ago. Does drink alcohol. She is disabled. Lives at home with her husband and disabled son - Husband is truck Hospital doctor. Son has a brain injury after he attempted suicide 2 years ago.    FH: Mom MI  ROS: All systems negative except as listed in HPI, PMH and Problem List.  Past Medical History  Diagnosis Date  . Tachycardia     Sinus tachycardia  . Dyslipidemia   . Hypothyroidism   . CAD (coronary artery disease)     MI in 2000 - MI  2007 - treated bare metal stent (no nuclear since then as 9/11)  . HTN (hypertension)   . Chronic diastolic heart failure (HCC)     a) ECHO (08/2013) EF 55-60% and RV function nl b) RHC (08/2013) RA 4, RV 30/5/7, PA 25/10 (16), PCWP 7, Fick CO/CI 6.3/2.7, PVR 1.5 WU, PA 61 and 66%  . RBBB (right bundle branch block)     Old  . Urinary incontinence   . Obesity   . Syncope     likely due to low blood sugar  . Carotid artery disease (HCC)   . Acute MI (HCC) 1999; 2007  . CHF (congestive heart failure) (HCC)   . Anxiety   . Depression   . Type II diabetes mellitus (HCC)   . Peripheral neuropathy (HCC)   . Venous insufficiency   . Stroke (  HCC)     mini strokes  . PONV (postoperative nausea and vomiting)   . Renal insufficiency     DENIES  . Anemia     hx  . Anginal pain (HCC)   . Exertional shortness of breath   . Daily headache     "~ every other day; since I fell in June" (03/15/2014)  . Osteoarthritis   . Arthritis     "generalized" (03/15/2014)    Current Outpatient Prescriptions  Medication Sig Dispense Refill  . albuterol (PROVENTIL HFA;VENTOLIN HFA) 108 (90 BASE) MCG/ACT inhaler Inhale 2 puffs into the lungs every 6 (six) hours as needed for wheezing or shortness of breath.    Marland Kitchen aspirin EC 81 MG tablet Take 81 mg by mouth daily.    . carvedilol (COREG) 12.5 MG tablet Take 12.5 mg by mouth 2 (two) times daily with a meal.    . clonazePAM (KLONOPIN) 1 MG tablet Take 1 tablet (1 mg total) by  mouth 3 (three) times daily. 90 tablet 2  . clopidogrel (PLAVIX) 75 MG tablet TAKE 1 TABLET (75 MG TOTAL) BY MOUTH DAILY WITH BREAKFAST. 90 tablet 2  . ferrous sulfate 325 (65 FE) MG tablet Take 325 mg by mouth daily with breakfast. Reported on 08/21/2015    . insulin NPH-regular Human (NOVOLIN 70/30) (70-30) 100 UNIT/ML injection Inject 12-75 Units into the skin 3 (three) times daily with meals. 75 units in the morning, and 50 with lunch and 12 in the evening    . isosorbide dinitrate (ISORDIL) 10 MG tablet TAKE 1 TABLET (10 MG TOTAL) BY MOUTH 2 (TWO) TIMES DAILY. 60 tablet 10  . levothyroxine (SYNTHROID, LEVOTHROID) 75 MCG tablet Take 1 tablet (75 mcg total) by mouth daily before breakfast. 30 tablet 3  . losartan (COZAAR) 50 MG tablet TAKE 1 TABLET BY MOUTH DAILY 90 tablet 1  . metFORMIN (GLUMETZA) 500 MG (MOD) 24 hr tablet Take 500 mg by mouth 2 (two) times daily with a meal.    . metolazone (ZAROXOLYN) 5 MG tablet Take 1 tablet (5 mg total) by mouth daily as needed (CALL CHF CLINIC BEFORE YOU TAKE A DOSE). 30 tablet 0  . nitroGLYCERIN (NITROSTAT) 0.4 MG SL tablet Place 1 tablet (0.4 mg total) under the tongue every 5 (five) minutes as needed for chest pain. 25 tablet 3  . potassium chloride SA (K-DUR,KLOR-CON) 20 MEQ tablet Take 1 tablet (20 mEq total) by mouth daily. 90 tablet 3  . pregabalin (LYRICA) 150 MG capsule Take 150 mg by mouth 2 (two) times daily. Reported on 09/27/2015    . torsemide (DEMADEX) 20 MG tablet Take 3 tablets (60 mg total) by mouth 2 (two) times daily. 150 tablet 2   No current facility-administered medications for this encounter.    Filed Vitals:   10/19/15 0827  BP: 150/88  Pulse: 76  Weight: 288 lb 4 oz (130.749 kg)  SpO2: 98%   PHYSICAL EXAM: General:  Chronically ill appearing. Dyspneic at rest. Ambulated slowly in the clinic     HEENT: normal Neck: supple. JVP difficult to assess d/t body habitus. Appear elevated.     Carotids 2+ bilaterally; no bruits. No  lymphadenopathy or thryomegaly appreciated. Cor: PMI normal. Regular rate & rhythm. No rubs, gallops or murmurs. Lungs: decreased in the bases.  Abdomen: obese, soft, nontender, distended. No hepatosplenomegaly. No bruits or masses. Good bowel sounds. Extremities: no cyanosis, clubbing, rash, R and LLE 2+  edema  Neuro: alert & orientedx3, cranial nerves  grossly intact. Moves all 4 extremities w/o difficulty. Affect pleasant.     ASSESSMENT & PLAN: 1. Acute/Chronic Diastolic Heart Failure: NICM, EF 55-60% Grade II DD(05/2015 )  -- NYHA III-IV symptoms. Volume status elevated.Give 80 mg IV lasix and 40 meq of potassium in the clinic.   Continue  tosemide 60 mg/60 mg twice a day + 20 meq potassium.    -Continue losartan 50 mg daily.     - Reinforced the need and importance of daily weights, a low sodium diet, and fluid restriction (less than 2 L a day). Instructed to call the HF clinic if weight increases more than 3 lbs overnight or 5 lbs in a week 2.. Dyspnea- Today seems to be related to volume overload. She has been offered sleep study many times but refuses.  Continue inhalers as needed.   3. Uncontrolled DM:  . Has follow up with PCP. Dr Lurene Shadow managing.  4. CAD- no evidence of ischemia- on aspirin and statin.  5. Obesity: Discussed portion control the importance of weight loss.  6. HTN-  Elevated. Recheck in am.    7. CKD Stage II- BMET today.    Follow up next week to reassess volume and for pharmacy visit.      Corissa Oguinn NP-C  8:48 AM

## 2015-10-19 NOTE — Patient Instructions (Signed)
Continue taking Torsemide 60 mg (3 tabs) Twice daily   Follow up next week,  PLEASE BRING ALL OF YOUR MEDICATIONS AND PILL BOX WITH YOU

## 2015-10-19 NOTE — Patient Outreach (Signed)
Assessment:  Upon review of documentation on client needs, CSW saw that client was still in need of grab bars installation at her home to help her with mobility and fall prevention in her bathroom. CSW called [facility] agency in [place], [place] on 10/19/15 and made referral to [facility] agency to request volunteers from agency help client with grab bar installation in bathroom of client. Representative of that agency said she had needed information and would pass referral request on to appropriate volunteer group for possible assistance for client. CSW thanked representative of [employer] agency for assistance in addressing needs of client. CSW then called client on 10/19/15.  CSW spoke via phone with client on 10/19/15. CSW verified client identity. CSW and client spoke of client needs.  CSW informed client that [place] had made referral for grab bar installation for client to [facility]'s agency. Client was appreciative of this referral.  CSW and client spoke of client care plan. CSW encouraged client to attend all scheduled client medical appointments in next 30 days. Client said she is eating well. She said she drives herself to scheduled medical appointments. She said she is trying to take her prescribed medications on schedule.  Client receives [place] support with RN Reginia Naas. CSW encouraged client to call CSW at [phone] as needed to discuss social work needs of client.   Plan:  Client to attend all scheduled client medical appointments in next 30 days. CSW to call client in 4 weeks to assess needs of client at that time. CSW to collaborate as needed with [facility] RN Reginia Naas in monitoring needs of client.   [name].Nazarene Bunning MSW, LCSW Licensed Clinical Social Worker [facility] Care Management [phone]

## 2015-10-19 NOTE — Progress Notes (Signed)
22 g IV started in left arm.  Pt was administered 80 mg IV Lasix and 40 meq of KCL PO.  Pt tolerated well.

## 2015-10-23 ENCOUNTER — Ambulatory Visit (HOSPITAL_COMMUNITY)
Admission: RE | Admit: 2015-10-23 | Discharge: 2015-10-23 | Disposition: A | Discharge status: Home / Self Care | Payer: PPO | Source: Ambulatory Visit | Attending: Cardiology | Admitting: Cardiology

## 2015-10-23 VITALS — BP 168/84 | HR 89 | Wt 291.0 lb

## 2015-10-23 DIAGNOSIS — I252 Old myocardial infarction: Secondary | ICD-10-CM | POA: Diagnosis not present

## 2015-10-23 DIAGNOSIS — I130 Hypertensive heart and chronic kidney disease with heart failure and stage 1 through stage 4 chronic kidney disease, or unspecified chronic kidney disease: Secondary | ICD-10-CM | POA: Diagnosis not present

## 2015-10-23 DIAGNOSIS — I5033 Acute on chronic diastolic (congestive) heart failure: Secondary | ICD-10-CM | POA: Diagnosis not present

## 2015-10-23 DIAGNOSIS — Z8249 Family history of ischemic heart disease and other diseases of the circulatory system: Secondary | ICD-10-CM | POA: Diagnosis not present

## 2015-10-23 DIAGNOSIS — I872 Venous insufficiency (chronic) (peripheral): Secondary | ICD-10-CM | POA: Insufficient documentation

## 2015-10-23 DIAGNOSIS — IMO0002 Reserved for concepts with insufficient information to code with codable children: Secondary | ICD-10-CM

## 2015-10-23 DIAGNOSIS — E669 Obesity, unspecified: Secondary | ICD-10-CM

## 2015-10-23 DIAGNOSIS — E1122 Type 2 diabetes mellitus with diabetic chronic kidney disease: Secondary | ICD-10-CM | POA: Insufficient documentation

## 2015-10-23 DIAGNOSIS — E785 Hyperlipidemia, unspecified: Secondary | ICD-10-CM | POA: Diagnosis not present

## 2015-10-23 DIAGNOSIS — Z87891 Personal history of nicotine dependence: Secondary | ICD-10-CM | POA: Diagnosis not present

## 2015-10-23 DIAGNOSIS — Z794 Long term (current) use of insulin: Secondary | ICD-10-CM | POA: Insufficient documentation

## 2015-10-23 DIAGNOSIS — E1165 Type 2 diabetes mellitus with hyperglycemia: Secondary | ICD-10-CM | POA: Insufficient documentation

## 2015-10-23 DIAGNOSIS — N183 Chronic kidney disease, stage 3 unspecified: Secondary | ICD-10-CM

## 2015-10-23 DIAGNOSIS — E1142 Type 2 diabetes mellitus with diabetic polyneuropathy: Secondary | ICD-10-CM | POA: Insufficient documentation

## 2015-10-23 DIAGNOSIS — Z8673 Personal history of transient ischemic attack (TIA), and cerebral infarction without residual deficits: Secondary | ICD-10-CM | POA: Insufficient documentation

## 2015-10-23 DIAGNOSIS — Z7982 Long term (current) use of aspirin: Secondary | ICD-10-CM | POA: Insufficient documentation

## 2015-10-23 DIAGNOSIS — E039 Hypothyroidism, unspecified: Secondary | ICD-10-CM | POA: Diagnosis not present

## 2015-10-23 DIAGNOSIS — I10 Essential (primary) hypertension: Secondary | ICD-10-CM | POA: Diagnosis not present

## 2015-10-23 DIAGNOSIS — E0822 Diabetes mellitus due to underlying condition with diabetic chronic kidney disease: Secondary | ICD-10-CM

## 2015-10-23 DIAGNOSIS — I2510 Atherosclerotic heart disease of native coronary artery without angina pectoris: Secondary | ICD-10-CM | POA: Diagnosis not present

## 2015-10-23 DIAGNOSIS — I428 Other cardiomyopathies: Secondary | ICD-10-CM | POA: Diagnosis not present

## 2015-10-23 DIAGNOSIS — Z7902 Long term (current) use of antithrombotics/antiplatelets: Secondary | ICD-10-CM | POA: Diagnosis not present

## 2015-10-23 DIAGNOSIS — E0865 Diabetes mellitus due to underlying condition with hyperglycemia: Secondary | ICD-10-CM

## 2015-10-23 DIAGNOSIS — Z79899 Other long term (current) drug therapy: Secondary | ICD-10-CM | POA: Diagnosis not present

## 2015-10-23 LAB — BASIC METABOLIC PANEL
Anion gap: 12 (ref 5–15)
BUN: 82 mg/dL — ABNORMAL HIGH (ref 6–20)
CO2: 22 mmol/L (ref 22–32)
Calcium: 9.1 mg/dL (ref 8.9–10.3)
Chloride: 102 mmol/L (ref 101–111)
Creatinine, Ser: 2.51 mg/dL — ABNORMAL HIGH (ref 0.44–1.00)
GFR calc Af Amer: 22 mL/min — ABNORMAL LOW (ref >60)
GFR calc non Af Amer: 19 mL/min — ABNORMAL LOW (ref >60)
Glucose, Bld: 104 mg/dL — ABNORMAL HIGH (ref 65–99)
Potassium: 4.7 mmol/L (ref 3.5–5.1)
Sodium: 136 mmol/L (ref 135–145)

## 2015-10-23 MED ORDER — METOLAZONE 5 MG PO TABS: 5.0000 mg | ORAL_TABLET | ORAL | Status: DC

## 2015-10-23 MED ORDER — CARVEDILOL 12.5 MG PO TABS: 18.7500 mg | ORAL_TABLET | Freq: Two times a day (BID) | ORAL | Status: DC

## 2015-10-23 MED ORDER — TORSEMIDE 20 MG PO TABS: 80.0000 mg | ORAL_TABLET | Freq: Two times a day (BID) | ORAL | Status: DC

## 2015-10-23 NOTE — Patient Instructions (Signed)
INCREASE Coreg to 18.75 mg, one and one half tab twice a day INCREASE Torsemide to 80 mg ( 4 tabs) twice a day CHANGE Metolazone to 2.5 mg, every Wednesday  Your physician recommends that you schedule a follow-up appointment in: 1 week with Susan Clegg,NP  Do the following things EVERYDAY: 1) Weigh yourself in the morning before breakfast. Write it down and keep it in a log. 2) Take your medicines as prescribed 3) Eat low salt foods-Limit salt (sodium) to 2000 mg per day.  4) Stay as active as you can everyday 5) Limit all fluids for the day to less than 2 liters 6)

## 2015-10-23 NOTE — Progress Notes (Signed)
Patient ID: Floy Ohta, female   DOB: May 26, 1951, 65 y.o.   MRN: [id number]   PCP: Dr Edilia Bo  Cardiologist: Dr Myrtis Ser  Endocrinologist: Dr Lurene Shadow  Primary HF Cardiologist: Dr Leory Plowman   HPI: Mrs Carrie is a 65 year old with a history of RBBB, DM, Hypothyroid, diastolic heart failure, CAD MI 2000/2007 BMS 2007, TIA, obesity.   Admitted 06/07/15 with N,V, hyperglycemia. Creatinine was up to 1.9. On the day discharge creatinine was back down to 1.2 Thought to be dehydrated so diuretics were held. Diuretics restarted on the day discharge. Discharge weight 282 pounds.   Admitted 2/13 through 07/13/15 with gastroenteritis and hyper glycemia. Diuretic initially held due to dehydration. Losartan and metolazone stopped with AKI/CKD. Discharge weight was 283 pounds.   Today she returns for HF follow up. Last week she was given IV lasix and a dose of metolazone. Remains SOB with exertion. + Orthopnea. Sleeping on 2 pillows. Weight at home 289.9 pounds. Having difficulty with blood sugars being elevated. Also had an episode of hypoglycemia. Taking all medications.  Drinking > 2 liters of fluid.   ECHO 07/11/2015- 50-55%.  Grade I DD  RHC 09/22/13: RA = 4  RV = 30/5/7  PA = 25/10 (16)  PCW = 7  Fick cardiac output/index = 6.3/2.7  PVR = 1.5 WU   FA sat = 93%  PA sat = 61%, 66%   Labs: 07/18/13 K 4.8 Creatinine 1.38 Glucose 1005 she refused to go to [facility] Labs : 08/17/13 K 3.9 Creatinine 1.4 Glucose 499 Labs: 09/29/13 K 4.5 Creatinine 1.4  Labs 04/18/14 K 4.4 Creatinine 1.18 Labs 03/26/2015: K 5.3 Creatinine 1.41  Labs 05/01/2015: K 4.9 Creatinine 1.8 Labs 05/04/2015: K 5.0 Creatinine 1.74  Labs 06/10/2015: K 4.7 Creatinine 1.21 Labs 06/12/2015: K 6.3 Creatinine 2.01  Labs 06/19/2015: K 4.5 Creatinine 1.29  Labs 07/10/2015: Hgb A1C 9.5  Labs 07/12/2015: K 4.4 Creatinine 1.62 K 4.4  Labs 08/10/2015: K 5.3 Creatinine 1.44  Labs 10/03/2015: K 4.5 Creatinine 1.41  Labs 10/19/15: K 4.7  creatinine 1.75    SH: Former smoker quit 15 year ago. Does drink alcohol. She is disabled. Lives at home with her husband and disabled son - Husband is [employer] doctor. Son has a brain injury after he attempted suicide 2 years ago.    FH: Mom MI  ROS: All systems negative except as listed in HPI, PMH and Problem List.  Past Medical History  Diagnosis Date  . Tachycardia     Sinus tachycardia  . Dyslipidemia   . Hypothyroidism   . CAD (coronary artery disease)     MI in 2000 - MI  2007 - treated bare metal stent (no nuclear since then as 9/11)  . HTN (hypertension)   . Chronic diastolic heart failure (HCC)     a) ECHO (08/2013) EF 55-60% and RV function nl b) RHC (08/2013) RA 4, RV 30/5/7, PA 25/10 (16), PCWP 7, Fick CO/CI 6.3/2.7, PVR 1.5 WU, PA 61 and 66%  . RBBB (right bundle branch block)     Old  . Urinary incontinence   . Obesity   . Syncope     likely due to low blood sugar  . Carotid artery disease (HCC)   . Acute MI (HCC) 1999; 2007  . CHF (congestive heart failure) (HCC)   . Anxiety   . Depression   . Type II diabetes mellitus (HCC)   . Peripheral neuropathy (HCC)   . Venous insufficiency   .  Stroke [facility])     mini strokes  . PONV (postoperative nausea and vomiting)   . Renal insufficiency     DENIES  . Anemia     hx  . Anginal pain (HCC)   . Exertional shortness of breath   . Daily headache     "~ every other day; since I fell in June" (03/15/2014)  . Osteoarthritis   . Arthritis     "generalized" (03/15/2014)    Current Outpatient Prescriptions  Medication Sig Dispense Refill  . albuterol (PROVENTIL HFA;VENTOLIN HFA) 108 (90 BASE) MCG/ACT inhaler Inhale 2 puffs into the lungs every 6 (six) hours as needed for wheezing or shortness of breath.    Marland Kitchen aspirin EC 81 MG tablet Take 81 mg by mouth daily.    . carvedilol (COREG) 12.5 MG tablet Take 12.5 mg by mouth 2 (two) times daily with a meal.    . clonazePAM (KLONOPIN) 1 MG tablet Take 1 tablet (1 mg total) by  mouth 3 (three) times daily. 90 tablet 2  . clopidogrel (PLAVIX) 75 MG tablet TAKE 1 TABLET (75 MG TOTAL) BY MOUTH DAILY WITH BREAKFAST. 90 tablet 2  . ferrous sulfate 325 (65 FE) MG tablet Take 325 mg by mouth daily with breakfast. Reported on 08/21/2015    . insulin NPH-regular Human (NOVOLIN 70/30) (70-30) 100 UNIT/ML injection Inject 12-75 Units into the skin 3 (three) times daily with meals. 75 units in the morning, and 50 with lunch and 12 in the evening    . isosorbide dinitrate (ISORDIL) 10 MG tablet TAKE 1 TABLET (10 MG TOTAL) BY MOUTH 2 (TWO) TIMES DAILY. 60 tablet 10  . levothyroxine (SYNTHROID, LEVOTHROID) 75 MCG tablet Take 1 tablet (75 mcg total) by mouth daily before breakfast. 30 tablet 3  . losartan (COZAAR) 50 MG tablet TAKE 1 TABLET BY MOUTH DAILY 90 tablet 1  . metFORMIN (GLUMETZA) 500 MG (MOD) 24 hr tablet Take 500 mg by mouth 2 (two) times daily with a meal.    . metolazone (ZAROXOLYN) 5 MG tablet Take 1 tablet (5 mg total) by mouth daily as needed (CALL CHF CLINIC BEFORE YOU TAKE A DOSE). 30 tablet 0  . nitroGLYCERIN (NITROSTAT) 0.4 MG SL tablet Place 1 tablet (0.4 mg total) under the tongue every 5 (five) minutes as needed for chest pain. 25 tablet 3  . potassium chloride SA (K-DUR,KLOR-CON) 20 MEQ tablet Take 20 mEq by mouth 2 (two) times daily.    . pregabalin (LYRICA) 150 MG capsule Take 150 mg by mouth 3 (three) times daily. Reported on 09/27/2015    . torsemide (DEMADEX) 20 MG tablet Take 3 tablets (60 mg total) by mouth 2 (two) times daily. 150 tablet 2   No current facility-administered medications for this encounter.    Filed Vitals:   10/23/15 1348  BP: 168/84  Pulse: 89  Weight: 291 lb (131.997 kg)  SpO2: 93%   PHYSICAL EXAM: General:  Chronically ill appearing. Ambulated slowly in the clinic. Husband present      HEENT: normal Neck: supple. JVP difficult to assess d/t body habitus. Appear elevated.     Carotids 2+ bilaterally; no bruits. No lymphadenopathy  or thryomegaly appreciated. Cor: PMI normal. Regular rate & rhythm. No rubs, gallops or murmurs. Lungs: decreased in the bases.  Abdomen: obese, soft, nontender, distended. No hepatosplenomegaly. No bruits or masses. Good bowel sounds. Extremities: no cyanosis, clubbing, rash, R and LLE 1-2+  edema  Neuro: alert & orientedx3, cranial nerves grossly intact.  Moves all 4 extremities w/o difficulty. Affect pleasant.     ASSESSMENT & PLAN: 1. Acute/Chronic Diastolic Heart Failure: NICM, EF 55-60% Grade II DD(05/2015 )  -- NYHA III symptoms. Volume status a little better despite today.  Increase torsemide 80 mg/80 mg twice a day + 20 meq potassium twice a day.   Add metolazone 2.5 mg every Wednesday. -Continue losartan 50 mg daily.     - Reinforced the need and importance of daily weights, a low sodium diet, and fluid restriction (less than 2 L a day). Instructed to call the HF clinic if weight increases more than 3 lbs overnight or 5 lbs in a week 2.. Dyspnea- Today seems to be related to volume overload. She has been offered sleep study many times but refuses.  Continue inhalers as needed.   3. Uncontrolled DM:  . Has follow up with PCP. Dr Lurene Shadow managing.  4. CAD- no evidence of ischemia- on aspirin and statin.  5. Obesity: Discussed portion control the importance of weight loss.  6. HTN-  Elevated. Increased carvedilol 18.75 mg twice a day. Continue losartan.    7. CKD Stage III- BMET today. Refer to Nephrology next week.    Follow up next week to reassess volume status.    Irelynd Zumstein NP-C  1:54 PM

## 2015-10-24 DIAGNOSIS — E1122 Type 2 diabetes mellitus with diabetic chronic kidney disease: Secondary | ICD-10-CM | POA: Insufficient documentation

## 2015-10-24 DIAGNOSIS — N184 Chronic kidney disease, stage 4 (severe): Secondary | ICD-10-CM | POA: Insufficient documentation

## 2015-10-25 ENCOUNTER — Observation Stay (HOSPITAL_COMMUNITY)
Admission: EM | Admit: 2015-10-25 | Discharge: 2015-10-28 | Disposition: A | Discharge status: Home Health | Payer: PPO | Attending: Family Medicine | Admitting: Family Medicine

## 2015-10-25 ENCOUNTER — Telehealth (HOSPITAL_COMMUNITY): Payer: Self-pay | Admitting: *Deleted

## 2015-10-25 ENCOUNTER — Encounter (HOSPITAL_COMMUNITY): Payer: Self-pay | Admitting: Emergency Medicine

## 2015-10-25 ENCOUNTER — Emergency Department (HOSPITAL_COMMUNITY): Payer: PPO

## 2015-10-25 DIAGNOSIS — R4182 Altered mental status, unspecified: Principal | ICD-10-CM | POA: Diagnosis present

## 2015-10-25 DIAGNOSIS — N182 Chronic kidney disease, stage 2 (mild): Secondary | ICD-10-CM | POA: Diagnosis not present

## 2015-10-25 DIAGNOSIS — Z7982 Long term (current) use of aspirin: Secondary | ICD-10-CM | POA: Diagnosis not present

## 2015-10-25 DIAGNOSIS — Z79899 Other long term (current) drug therapy: Secondary | ICD-10-CM | POA: Insufficient documentation

## 2015-10-25 DIAGNOSIS — Z955 Presence of coronary angioplasty implant and graft: Secondary | ICD-10-CM | POA: Diagnosis not present

## 2015-10-25 DIAGNOSIS — Z87891 Personal history of nicotine dependence: Secondary | ICD-10-CM | POA: Insufficient documentation

## 2015-10-25 DIAGNOSIS — R404 Transient alteration of awareness: Secondary | ICD-10-CM | POA: Diagnosis not present

## 2015-10-25 DIAGNOSIS — I5032 Chronic diastolic (congestive) heart failure: Secondary | ICD-10-CM | POA: Insufficient documentation

## 2015-10-25 DIAGNOSIS — E039 Hypothyroidism, unspecified: Secondary | ICD-10-CM | POA: Diagnosis not present

## 2015-10-25 DIAGNOSIS — R42 Dizziness and giddiness: Secondary | ICD-10-CM | POA: Diagnosis not present

## 2015-10-25 DIAGNOSIS — D649 Anemia, unspecified: Secondary | ICD-10-CM | POA: Insufficient documentation

## 2015-10-25 DIAGNOSIS — Z8673 Personal history of transient ischemic attack (TIA), and cerebral infarction without residual deficits: Secondary | ICD-10-CM | POA: Insufficient documentation

## 2015-10-25 DIAGNOSIS — I878 Other specified disorders of veins: Secondary | ICD-10-CM | POA: Insufficient documentation

## 2015-10-25 DIAGNOSIS — E11649 Type 2 diabetes mellitus with hypoglycemia without coma: Secondary | ICD-10-CM | POA: Insufficient documentation

## 2015-10-25 DIAGNOSIS — I252 Old myocardial infarction: Secondary | ICD-10-CM | POA: Diagnosis not present

## 2015-10-25 DIAGNOSIS — Z66 Do not resuscitate: Secondary | ICD-10-CM | POA: Diagnosis not present

## 2015-10-25 DIAGNOSIS — I509 Heart failure, unspecified: Secondary | ICD-10-CM

## 2015-10-25 DIAGNOSIS — I517 Cardiomegaly: Secondary | ICD-10-CM | POA: Diagnosis not present

## 2015-10-25 DIAGNOSIS — E1122 Type 2 diabetes mellitus with diabetic chronic kidney disease: Secondary | ICD-10-CM | POA: Insufficient documentation

## 2015-10-25 DIAGNOSIS — E1142 Type 2 diabetes mellitus with diabetic polyneuropathy: Secondary | ICD-10-CM | POA: Diagnosis not present

## 2015-10-25 DIAGNOSIS — Z794 Long term (current) use of insulin: Secondary | ICD-10-CM | POA: Diagnosis not present

## 2015-10-25 DIAGNOSIS — I428 Other cardiomyopathies: Secondary | ICD-10-CM | POA: Insufficient documentation

## 2015-10-25 DIAGNOSIS — N19 Unspecified kidney failure: Secondary | ICD-10-CM

## 2015-10-25 DIAGNOSIS — Z7902 Long term (current) use of antithrombotics/antiplatelets: Secondary | ICD-10-CM | POA: Diagnosis not present

## 2015-10-25 DIAGNOSIS — R531 Weakness: Secondary | ICD-10-CM | POA: Diagnosis not present

## 2015-10-25 DIAGNOSIS — I130 Hypertensive heart and chronic kidney disease with heart failure and stage 1 through stage 4 chronic kidney disease, or unspecified chronic kidney disease: Secondary | ICD-10-CM | POA: Insufficient documentation

## 2015-10-25 DIAGNOSIS — T68XXXA Hypothermia, initial encounter: Secondary | ICD-10-CM | POA: Diagnosis not present

## 2015-10-25 DIAGNOSIS — N179 Acute kidney failure, unspecified: Secondary | ICD-10-CM | POA: Diagnosis present

## 2015-10-25 DIAGNOSIS — I4510 Unspecified right bundle-branch block: Secondary | ICD-10-CM | POA: Insufficient documentation

## 2015-10-25 DIAGNOSIS — Z6841 Body Mass Index (BMI) 40.0 and over, adult: Secondary | ICD-10-CM | POA: Diagnosis not present

## 2015-10-25 DIAGNOSIS — N189 Chronic kidney disease, unspecified: Secondary | ICD-10-CM

## 2015-10-25 DIAGNOSIS — I2510 Atherosclerotic heart disease of native coronary artery without angina pectoris: Secondary | ICD-10-CM | POA: Insufficient documentation

## 2015-10-25 DIAGNOSIS — I129 Hypertensive chronic kidney disease with stage 1 through stage 4 chronic kidney disease, or unspecified chronic kidney disease: Secondary | ICD-10-CM | POA: Diagnosis not present

## 2015-10-25 DIAGNOSIS — L03116 Cellulitis of left lower limb: Secondary | ICD-10-CM | POA: Insufficient documentation

## 2015-10-25 DIAGNOSIS — L03115 Cellulitis of right lower limb: Secondary | ICD-10-CM | POA: Diagnosis not present

## 2015-10-25 LAB — RAPID URINE DRUG SCREEN, HOSP PERFORMED
Amphetamines: NOT DETECTED
Barbiturates: NOT DETECTED
Benzodiazepines: POSITIVE — AB
Cocaine: NOT DETECTED
Opiates: NOT DETECTED
Tetrahydrocannabinol: NOT DETECTED

## 2015-10-25 LAB — URINALYSIS, ROUTINE W REFLEX MICROSCOPIC
Bilirubin Urine: NEGATIVE
Glucose, UA: NEGATIVE mg/dL
Ketones, ur: NEGATIVE mg/dL
Leukocytes, UA: NEGATIVE
Nitrite: NEGATIVE
Protein, ur: 100 mg/dL — AB
Specific Gravity, Urine: 1.017 (ref 1.005–1.030)
pH: 5 (ref 5.0–8.0)

## 2015-10-25 LAB — COMPREHENSIVE METABOLIC PANEL
ALT: 27 U/L (ref 14–54)
AST: 29 U/L (ref 15–41)
Albumin: 3.2 g/dL — ABNORMAL LOW (ref 3.5–5.0)
Alkaline Phosphatase: 104 U/L (ref 38–126)
Anion gap: 11 (ref 5–15)
BUN: 99 mg/dL — ABNORMAL HIGH (ref 6–20)
CO2: 21 mmol/L — ABNORMAL LOW (ref 22–32)
Calcium: 9.2 mg/dL (ref 8.9–10.3)
Chloride: 106 mmol/L (ref 101–111)
Creatinine, Ser: 2.12 mg/dL — ABNORMAL HIGH (ref 0.44–1.00)
GFR calc Af Amer: 27 mL/min — ABNORMAL LOW (ref >60)
GFR calc non Af Amer: 23 mL/min — ABNORMAL LOW (ref >60)
Glucose, Bld: 211 mg/dL — ABNORMAL HIGH (ref 65–99)
Potassium: 4.9 mmol/L (ref 3.5–5.1)
Sodium: 138 mmol/L (ref 135–145)
Total Bilirubin: 0.5 mg/dL (ref 0.3–1.2)
Total Protein: 7 g/dL (ref 6.5–8.1)

## 2015-10-25 LAB — CBC WITH DIFFERENTIAL/PLATELET
Basophils Absolute: 0 10*3/uL (ref 0.0–0.1)
Basophils Relative: 0 %
Eosinophils Absolute: 0.1 10*3/uL (ref 0.0–0.7)
Eosinophils Relative: 2 %
HCT: 37.4 % (ref 36.0–46.0)
Hemoglobin: 11.7 g/dL — ABNORMAL LOW (ref 12.0–15.0)
Lymphocytes Relative: 11 %
Lymphs Abs: 0.7 10*3/uL (ref 0.7–4.0)
MCH: 24.3 pg — ABNORMAL LOW (ref 26.0–34.0)
MCHC: 31.3 g/dL (ref 30.0–36.0)
MCV: 77.6 fL — ABNORMAL LOW (ref 78.0–100.0)
Monocytes Absolute: 0.3 10*3/uL (ref 0.1–1.0)
Monocytes Relative: 5 %
Neutro Abs: 5.4 10*3/uL (ref 1.7–7.7)
Neutrophils Relative %: 83 %
Platelets: 201 10*3/uL (ref 150–400)
RBC: 4.82 MIL/uL (ref 3.87–5.11)
RDW: 17.5 % — ABNORMAL HIGH (ref 11.5–15.5)
WBC: 6.5 10*3/uL (ref 4.0–10.5)

## 2015-10-25 LAB — URINE MICROSCOPIC-ADD ON

## 2015-10-25 LAB — SALICYLATE LEVEL: Salicylate Lvl: <4 mg/dL (ref 2.8–30.0)

## 2015-10-25 LAB — GLUCOSE, CAPILLARY
Glucose-Capillary: 251 mg/dL — ABNORMAL HIGH (ref 65–99)
Glucose-Capillary: 240 mg/dL — ABNORMAL HIGH (ref 65–99)

## 2015-10-25 LAB — LACTIC ACID, PLASMA: Lactic Acid, Venous: 1 mmol/L (ref 0.5–2.0)

## 2015-10-25 LAB — ETHANOL: Alcohol, Ethyl (B): <5 mg/dL (ref <5)

## 2015-10-25 MED ORDER — INSULIN ASPART 100 UNIT/ML ~~LOC~~ SOLN
0.0000 [IU] | Freq: Every day | SUBCUTANEOUS | Status: DC
  Administered 2015-10-26: 5 [IU] via SUBCUTANEOUS
  Administered 2015-10-26 – 2015-10-27 (×2): 2 [IU] via SUBCUTANEOUS

## 2015-10-25 MED ORDER — FERROUS SULFATE 325 (65 FE) MG PO TABS
325.0000 mg | ORAL_TABLET | Freq: Every day | ORAL | Status: DC
  Administered 2015-10-26 – 2015-10-28 (×3): 325 mg via ORAL
  Filled 2015-10-25 (×3): qty 1

## 2015-10-25 MED ORDER — ISOSORBIDE DINITRATE 10 MG PO TABS
10.0000 mg | ORAL_TABLET | Freq: Two times a day (BID) | ORAL | Status: DC
  Administered 2015-10-26 – 2015-10-28 (×5): 10 mg via ORAL
  Filled 2015-10-25: qty 0.5
  Filled 2015-10-25 (×5): qty 1

## 2015-10-25 MED ORDER — CLOPIDOGREL BISULFATE 75 MG PO TABS
75.0000 mg | ORAL_TABLET | Freq: Every day | ORAL | Status: DC
  Administered 2015-10-26 – 2015-10-28 (×3): 75 mg via ORAL
  Filled 2015-10-25 (×3): qty 1

## 2015-10-25 MED ORDER — ALBUTEROL SULFATE (2.5 MG/3ML) 0.083% IN NEBU
2.5000 mg | INHALATION_SOLUTION | Freq: Four times a day (QID) | RESPIRATORY_TRACT | Status: DC | PRN
  Administered 2015-10-27: 2.5 mg via RESPIRATORY_TRACT
  Filled 2015-10-25: qty 3

## 2015-10-25 MED ORDER — INSULIN ASPART 100 UNIT/ML ~~LOC~~ SOLN
0.0000 [IU] | Freq: Three times a day (TID) | SUBCUTANEOUS | Status: DC
  Administered 2015-10-26: 8 [IU] via SUBCUTANEOUS
  Administered 2015-10-26: 5 [IU] via SUBCUTANEOUS
  Administered 2015-10-26 (×2): 11 [IU] via SUBCUTANEOUS
  Administered 2015-10-27: 15 [IU] via SUBCUTANEOUS
  Administered 2015-10-27 (×2): 11 [IU] via SUBCUTANEOUS
  Administered 2015-10-28: 8 [IU] via SUBCUTANEOUS
  Administered 2015-10-28: 5 [IU] via SUBCUTANEOUS

## 2015-10-25 MED ORDER — ASPIRIN EC 81 MG PO TBEC
81.0000 mg | DELAYED_RELEASE_TABLET | Freq: Every day | ORAL | Status: DC
  Administered 2015-10-26 – 2015-10-28 (×3): 81 mg via ORAL
  Filled 2015-10-25 (×3): qty 1

## 2015-10-25 MED ORDER — DOXYCYCLINE HYCLATE 100 MG PO TABS
100.0000 mg | ORAL_TABLET | Freq: Two times a day (BID) | ORAL | Status: DC
  Administered 2015-10-26 – 2015-10-28 (×6): 100 mg via ORAL
  Filled 2015-10-25 (×6): qty 1

## 2015-10-25 MED ORDER — HEPARIN SODIUM (PORCINE) 5000 UNIT/ML IJ SOLN
5000.0000 [IU] | Freq: Three times a day (TID) | INTRAMUSCULAR | Status: DC
  Administered 2015-10-25 – 2015-10-27 (×7): 5000 [IU] via SUBCUTANEOUS
  Filled 2015-10-25 (×6): qty 1

## 2015-10-25 MED ORDER — SODIUM CHLORIDE 0.9 % IV BOLUS (SEPSIS)
500.0000 mL | Freq: Once | INTRAVENOUS | Status: AC
  Administered 2015-10-25: 500 mL via INTRAVENOUS

## 2015-10-25 MED ORDER — LEVOTHYROXINE SODIUM 75 MCG PO TABS
75.0000 ug | ORAL_TABLET | Freq: Every day | ORAL | Status: DC
  Administered 2015-10-26 – 2015-10-28 (×3): 75 ug via ORAL
  Filled 2015-10-25 (×4): qty 1

## 2015-10-25 MED ORDER — CARVEDILOL 6.25 MG PO TABS
18.7500 mg | ORAL_TABLET | Freq: Two times a day (BID) | ORAL | Status: DC
  Administered 2015-10-26 – 2015-10-28 (×5): 18.75 mg via ORAL
  Filled 2015-10-25 (×5): qty 1

## 2015-10-25 MED ORDER — LOSARTAN POTASSIUM 50 MG PO TABS: 50.0000 mg | ORAL_TABLET | Freq: Every day | ORAL | Status: DC

## 2015-10-25 MED ORDER — METOLAZONE 2.5 MG PO TABS: 5.0000 mg | ORAL_TABLET | ORAL | Status: DC

## 2015-10-25 MED ORDER — PREGABALIN 75 MG PO CAPS
150.0000 mg | ORAL_CAPSULE | Freq: Three times a day (TID) | ORAL | Status: DC
  Administered 2015-10-25 – 2015-10-28 (×8): 150 mg via ORAL
  Filled 2015-10-25 (×8): qty 2

## 2015-10-25 MED ORDER — TORSEMIDE 20 MG PO TABS
80.0000 mg | ORAL_TABLET | Freq: Two times a day (BID) | ORAL | Status: DC
  Administered 2015-10-26: 80 mg via ORAL
  Filled 2015-10-25: qty 4

## 2015-10-25 MED ORDER — POTASSIUM CHLORIDE CRYS ER 20 MEQ PO TBCR
20.0000 meq | EXTENDED_RELEASE_TABLET | Freq: Two times a day (BID) | ORAL | Status: DC
  Administered 2015-10-25 – 2015-10-26 (×3): 20 meq via ORAL
  Filled 2015-10-25 (×4): qty 1

## 2015-10-25 NOTE — ED Notes (Signed)
Spoke with bed placement. States if pt remains on bear hugger pt when need step down bed. Admitting paged.

## 2015-10-25 NOTE — ED Provider Notes (Signed)
CSN: [id number]     Arrival date & time 10/25/15  1112 History   First MD Initiated Contact with Patient 10/25/15 1118     Chief Complaint  Patient presents with  . Altered Mental Status     (Consider location/radiation/quality/duration/timing/severity/associated sxs/prior Treatment) HPI Comments: Patient with history of DM, HTN, HLD, CHF, CAD, RBBB, MI, peripheral neuropathy, CVA,  arrives by EMS who were called by home health nurse after talking to the patient on the phone and having concern for disorientation, slurred speech and altered mental status. The patient states she has been sick for today only. She complains of a headache and denies abdominal pain, vomiting, cough, SOB. No family with the patient. Available history is limited.   Patient is a 65 y.o. female presenting with altered mental status. The history is provided by the patient and the EMS personnel. No language interpreter was used.  Altered Mental Status Presenting symptoms: confusion and disorientation   Severity:  Moderate Duration:  5 days Timing:  Constant   Past Medical History  Diagnosis Date  . Tachycardia     Sinus tachycardia  . Dyslipidemia   . Hypothyroidism   . CAD (coronary artery disease)     MI in 2000 - MI  2007 - treated bare metal stent (no nuclear since then as 9/11)  . HTN (hypertension)   . Chronic diastolic heart failure (HCC)     a) ECHO (08/2013) EF 55-60% and RV function nl b) RHC (08/2013) RA 4, RV 30/5/7, PA 25/10 (16), PCWP 7, Fick CO/CI 6.3/2.7, PVR 1.5 WU, PA 61 and 66%  . RBBB (right bundle branch block)     Old  . Urinary incontinence   . Obesity   . Syncope     likely due to low blood sugar  . Carotid artery disease ([place])   . Acute MI ([place]) 1999; 2007  . CHF (congestive heart failure) ([place])   . Anxiety   . Depression   . Type II diabetes mellitus ([place])   . Peripheral neuropathy ([place])   . Venous insufficiency   . Stroke [facility])     mini strokes  . PONV (postoperative nausea  and vomiting)   . Renal insufficiency     DENIES  . Anemia     hx  . Anginal pain ([place])   . Exertional shortness of breath   . Daily headache     "~ every other day; since I fell in June" (03/15/2014)  . Osteoarthritis   . Arthritis     "generalized" (03/15/2014)   Past Surgical History  Procedure Laterality Date  . Knee arthroscopy Left 10/25/2006  . Coronary angioplasty with stent placement  1999; 2007    "1 + 1"  . Abdominal hysterectomy  1980's  . Tubal ligation  1970's  . Cataract extraction, bilateral Bilateral ?2013  . Shoulder arthroscopy w/ rotator cuff repair Right 03/14/2014  . Shoulder arthroscopy with open rotator cuff repair Right 03/14/2014    Procedure: RIGHT SHOULDER ARTHROSCOPY WITH BICEPS RELEASE, OPEN SUBSCAPULA REPAIR, OPEN SUPRASPINATUS REPAIR.;  Surgeon: Meredith Pel, MD;  Location: [place];  Service: Orthopedics;  Laterality: Right;  . Right heart catheterization N/A 09/22/2013    Procedure: RIGHT HEART CATH;  Surgeon: Jolaine Artist, MD;  Location: [facility] CATH LAB;  Service: Cardiovascular;  Laterality: N/A;   Family History  Problem Relation Age of Onset  . Coronary artery disease    . Heart attack Mother    Social History  Substance  Use Topics  . Smoking status: Former Smoker -- 3.00 packs/day for 32 years    Types: Cigarettes    Quit date: 10/24/1997  . Smokeless tobacco: Never Used  . Alcohol Use: Yes     Comment: "might have 2-3 daiquiris in the summer"   OB History    No data available     Review of Systems  Unable to perform ROS: Mental status change  Psychiatric/Behavioral: Positive for confusion.      Allergies  Codeine  Home Medications   Prior to Admission medications   Medication Sig Start Date End Date Taking? Authorizing Provider  albuterol (PROVENTIL HFA;VENTOLIN HFA) 108 (90 BASE) MCG/ACT inhaler Inhale 2 puffs into the lungs every 6 (six) hours as needed for wheezing or shortness of breath.    Historical Provider,  MD  aspirin EC 81 MG tablet Take 81 mg by mouth daily.    Historical Provider, MD  carvedilol (COREG) 12.5 MG tablet Take 1.5 tablets (18.75 mg total) by mouth 2 (two) times daily with a meal. 10/23/15   Amy D Clegg, NP  clonazePAM (KLONOPIN) 1 MG tablet Take 1 tablet (1 mg total) by mouth 3 (three) times daily. 10/03/15   Lonie Peak Dixon, PA-C  clopidogrel (PLAVIX) 75 MG tablet TAKE 1 TABLET (75 MG TOTAL) BY MOUTH DAILY WITH BREAKFAST. 09/11/15   Jolaine Artist, MD  ferrous sulfate 325 (65 FE) MG tablet Take 325 mg by mouth daily with breakfast. Reported on 08/21/2015    Historical Provider, MD  insulin NPH-regular Human (NOVOLIN 70/30) (70-30) 100 UNIT/ML injection Inject 12-75 Units into the skin 3 (three) times daily with meals. 75 units in the morning, and 50 with lunch and 12 in the evening    Historical Provider, MD  isosorbide dinitrate (ISORDIL) 10 MG tablet TAKE 1 TABLET (10 MG TOTAL) BY MOUTH 2 (TWO) TIMES DAILY. 06/18/15   Burnell Blanks, MD  levothyroxine (SYNTHROID, LEVOTHROID) 75 MCG tablet Take 1 tablet (75 mcg total) by mouth daily before breakfast. 07/12/15   Ripudeep Krystal Eaton, MD  losartan (COZAAR) 50 MG tablet TAKE 1 TABLET BY MOUTH DAILY 09/11/15   [name], PA-C  metFORMIN (GLUMETZA) 500 MG (MOD) 24 hr tablet Take 500 mg by mouth 2 (two) times daily with a meal.    Historical Provider, MD  metolazone (ZAROXOLYN) 5 MG tablet Take 1 tablet (5 mg total) by mouth once a week. On Wednesday 10/23/15   Conrad Dunn Loring, NP  nitroGLYCERIN (NITROSTAT) 0.4 MG SL tablet Place 1 tablet (0.4 mg total) under the tongue every 5 (five) minutes as needed for chest pain. 05/08/15   Amy D Clegg, NP  potassium chloride SA (K-DUR,KLOR-CON) 20 MEQ tablet Take 20 mEq by mouth 2 (two) times daily.    Historical Provider, MD  pregabalin (LYRICA) 150 MG capsule Take 150 mg by mouth 3 (three) times daily. Reported on 09/27/2015    Historical Provider, MD  torsemide (DEMADEX) 20 MG tablet Take 4 tablets (80 mg  total) by mouth 2 (two) times daily. 10/23/15   Amy D Clegg, NP   Ht 5\' 10"  (1.778 m)  Wt 131.997 kg  BMI 41.75 kg/m2  SpO2 92% Physical Exam  Constitutional: She appears well-developed and well-nourished.  HENT:  Head: Normocephalic.  Neck: Normal range of motion. Neck supple.  Cardiovascular: Normal rate and regular rhythm.   Pulmonary/Chest: Effort normal and breath sounds normal.  Abdominal: Soft. Bowel sounds are normal. There is no tenderness. There is  no rebound and no guarding.  Musculoskeletal: Normal range of motion.  Neurological: She is alert.  Patient following command. Bilaterally equal weakness of UE and LE extremities. She is disoriented to time only. No slurring of speech, no word finding difficulty. No gross CN deficits.  Skin: Skin is warm and dry. No rash noted.  Psychiatric: She has a normal mood and affect.    ED Course  Procedures (including critical care time) Labs Review Labs Reviewed  URINE CULTURE  CULTURE, BLOOD (ROUTINE X 2)  CULTURE, BLOOD (ROUTINE X 2)  LACTIC ACID, PLASMA  LACTIC ACID, PLASMA  COMPREHENSIVE METABOLIC PANEL  CBC WITH DIFFERENTIAL/PLATELET  URINALYSIS, ROUTINE W REFLEX MICROSCOPIC (NOT AT [facility])    Imaging Review No results found. I have personally reviewed and evaluated these images and lab results as part of my medical decision-making.   EKG Interpretation None      MDM   Final diagnoses:  None    1. Altered mental status 2. Hypothermia  Patient presents from home by EMS called by home health nurse concerned about confusion and altered mental status. Patient found to be hypothermic in the ED to 90 and Bair hugger applied.  Labs significant for AKI (Cr. 2/12), hyperglycemia (211), negative head CT. Cause of hypothermia unknown, consider sepsis. Patient seen and evaluated by Dr. Kathrynn Humble who admits the patient for further management.     Charlann Lange, PA-C 10/30/15 [id number]  Varney Biles, MD 10/31/15 [phone]

## 2015-10-25 NOTE — ED Notes (Signed)
Report attempted.  Nurse to call back.

## 2015-10-25 NOTE — ED Notes (Signed)
Spoke with admitting to change bed assignment to step down.

## 2015-10-25 NOTE — ED Notes (Signed)
Report attempted. Will call back after shift change.

## 2015-10-25 NOTE — ED Notes (Signed)
Sharlene Motts, PA,  at bedside.

## 2015-10-25 NOTE — ED Notes (Signed)
MD at bedside. 

## 2015-10-25 NOTE — ED Notes (Signed)
Attempted to call report
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
## 2015-10-25 NOTE — ED Notes (Signed)
Patient transported to CT 

## 2015-10-25 NOTE — ED Notes (Signed)
Pt arrives from home via [facility].  EMS reports pt has been altered for 5 days.  Pt oriented to self and place, disoriented to time and situation.  Pupils pinpoint. EMS reports gait unsteady, speech slurred.   No focal deficits noted.

## 2015-10-25 NOTE — Telephone Encounter (Signed)
Called pt to give lab result pt answered the phone with slurred speech and confusion. Pt did not understand directions with medications.  Pt would not give me any information besides she did not "feel right". She only wanted to speak with Chantel. Chantel called the patient immediately agreed the patient was confused with slurred speech . Chantel asked the patient to hang up and dial 911. Chantel did not feel comfortable with patient calling EMS herself so instead called 911 and sent them to patients home. Son is home with patient but unable to drive. Will follow up once EMS arrives at the home.   Notes Recorded by Conrad La Vina, NP on 10/24/2015 at 10:34 AM Please call and instruct to hold torsemide, potassium, and metolazone today. Restart on Saturday. Repeat labs next week.       Ref Range 2d ago  6d ago  3wk ago  61mo ago  61mo ago    Sodium 135 - 145 mmol/L 136 134 (L) 139R 142 137   Potassium 3.5 - 5.1 mmol/L 4.7 4.7 4.5R 3.3 (L) 5.3 (H)   Chloride 101 - 111 mmol/L 102 103 104R 103 99 (L)   CO2 22 - 32 mmol/L 22 21 (L) 27R 25 23   Glucose, Bld 65 - 99 mg/dL 104 (H) 417 (H) 42 (LL)R, CM 220 (H) 431 (H)   BUN 6 - 20 mg/dL 82 (H) 64 (H) 37 (H)R 31 (H) 50 (H)   Creatinine, Ser 0.44 - 1.00 mg/dL 2.51 (H) 1.75 (H) 1.41 (H)R 1.80 (H) 1.44 (H)   Calcium 8.9 - 10.3 mg/dL 9.1 9.1 8.8R 8.4 (L) 9.0   GFR calc non Af Amer >60 mL/min 19 (L) 60 mL/min" class="rz_1r" style="cursor: pointer;" onmouseover='jscript: var varStyle="underline"; var bgColor="#DCD7CE"; this.style.backgroundColor=bgColor; var children=this.getElementsByTagName("div"); for(var child=0;child 29 (L)  60 mL/min" class="rz_1r" style="cursor: pointer; background-color: rgb(228, 223, 214);" onmouseover='jscript: var varStyle="underline"; var bgColor="#DCD7CE"; this.style.backgroundColor=bgColor; var children=this.getElementsByTagName("div"); for(var child=0;child 28 (L) 60 mL/min" class="rz_1s" style="cursor: pointer; background-color:  rgb(228, 223, 214);" onmouseover='jscript: var varStyle="underline"; var bgColor="#DCD7CE"; this.style.backgroundColor=bgColor; var children=this.getElementsByTagName("div"); for(var child=0;child 37 (L)   GFR calc Af Amer >60 mL/min 22 (L) 60 mL/min" class="rz_1r" style="cursor: pointer;" onmouseover='jscript: var varStyle="underline"; var bgColor="#DCD7CE"; this.style.backgroundColor=bgColor; var children=this.getElementsByTagName("div"); for(var child=0;child 34 (L)CM  60 mL/min" class="rz_1r" style="cursor: pointer; background-color: rgb(228, 223, 214);" onmouseover='jscript: var varStyle="underline"; var bgColor="#DCD7CE"; this.style.backgroundColor=bgColor; var children=this.getElementsByTagName("div"); for(var child=0;child 33 (L)CM 60 mL/min" class="rz_1s" style="cursor: pointer; background-color: rgb(228, 223, 214);" onmouseover='jscript: var varStyle="underline"; var bgColor="#DCD7CE"; this.style.backgroundColor=bgColor; var children=this.getElementsByTagName("div"); for(var child=0;child 43 (L)CM

## 2015-10-26 ENCOUNTER — Observation Stay (HOSPITAL_COMMUNITY): Payer: PPO

## 2015-10-26 DIAGNOSIS — R531 Weakness: Secondary | ICD-10-CM | POA: Insufficient documentation

## 2015-10-26 DIAGNOSIS — N179 Acute kidney failure, unspecified: Secondary | ICD-10-CM | POA: Diagnosis not present

## 2015-10-26 DIAGNOSIS — T68XXXA Hypothermia, initial encounter: Secondary | ICD-10-CM | POA: Insufficient documentation

## 2015-10-26 DIAGNOSIS — R000 Tachycardia, unspecified: Secondary | ICD-10-CM | POA: Diagnosis not present

## 2015-10-26 DIAGNOSIS — N189 Chronic kidney disease, unspecified: Secondary | ICD-10-CM | POA: Diagnosis not present

## 2015-10-26 DIAGNOSIS — R4182 Altered mental status, unspecified: Secondary | ICD-10-CM | POA: Diagnosis not present

## 2015-10-26 LAB — BASIC METABOLIC PANEL
Anion gap: 11 (ref 5–15)
BUN: 92 mg/dL — ABNORMAL HIGH (ref 6–20)
CO2: 23 mmol/L (ref 22–32)
Calcium: 8.8 mg/dL — ABNORMAL LOW (ref 8.9–10.3)
Chloride: 102 mmol/L (ref 101–111)
Creatinine, Ser: 1.71 mg/dL — ABNORMAL HIGH (ref 0.44–1.00)
GFR calc Af Amer: 35 mL/min — ABNORMAL LOW (ref >60)
GFR calc non Af Amer: 30 mL/min — ABNORMAL LOW (ref >60)
Glucose, Bld: 234 mg/dL — ABNORMAL HIGH (ref 65–99)
Potassium: 4.2 mmol/L (ref 3.5–5.1)
Sodium: 136 mmol/L (ref 135–145)

## 2015-10-26 LAB — CBC
HCT: 33.8 % — ABNORMAL LOW (ref 36.0–46.0)
Hemoglobin: 10.5 g/dL — ABNORMAL LOW (ref 12.0–15.0)
MCH: 24.1 pg — ABNORMAL LOW (ref 26.0–34.0)
MCHC: 31.1 g/dL (ref 30.0–36.0)
MCV: 77.5 fL — ABNORMAL LOW (ref 78.0–100.0)
Platelets: 238 10*3/uL (ref 150–400)
RBC: 4.36 MIL/uL (ref 3.87–5.11)
RDW: 17.6 % — ABNORMAL HIGH (ref 11.5–15.5)
WBC: 6.8 10*3/uL (ref 4.0–10.5)

## 2015-10-26 LAB — URINE CULTURE: Culture: <10000 — AB

## 2015-10-26 LAB — TSH: TSH: 1.493 u[IU]/mL (ref 0.350–4.500)

## 2015-10-26 LAB — GLUCOSE, CAPILLARY
Glucose-Capillary: 307 mg/dL — ABNORMAL HIGH (ref 65–99)
Glucose-Capillary: 339 mg/dL — ABNORMAL HIGH (ref 65–99)
Glucose-Capillary: 298 mg/dL — ABNORMAL HIGH (ref 65–99)
Glucose-Capillary: 400 mg/dL — ABNORMAL HIGH (ref 65–99)

## 2015-10-26 LAB — BRAIN NATRIURETIC PEPTIDE: B Natriuretic Peptide: 54.2 pg/mL (ref 0.0–100.0)

## 2015-10-26 LAB — MRSA PCR SCREENING: MRSA by PCR: NEGATIVE

## 2015-10-26 LAB — CK: Total CK: 242 U/L — ABNORMAL HIGH (ref 38–234)

## 2015-10-26 MED ORDER — SODIUM CHLORIDE 0.9 % IV SOLN
INTRAVENOUS | Status: AC
  Administered 2015-10-26: 11:00:00 via INTRAVENOUS

## 2015-10-26 MED ORDER — INSULIN GLARGINE 100 UNIT/ML ~~LOC~~ SOLN
30.0000 [IU] | Freq: Every day | SUBCUTANEOUS | Status: DC
  Administered 2015-10-27: 30 [IU] via SUBCUTANEOUS
  Filled 2015-10-26 (×2): qty 0.3

## 2015-10-26 MED ORDER — CLONAZEPAM 0.5 MG PO TABS
0.5000 mg | ORAL_TABLET | Freq: Two times a day (BID) | ORAL | Status: DC
  Administered 2015-10-26 – 2015-10-28 (×5): 0.5 mg via ORAL
  Filled 2015-10-26 (×5): qty 1

## 2015-10-26 MED ORDER — INSULIN GLARGINE 100 UNIT/ML ~~LOC~~ SOLN
25.0000 [IU] | Freq: Every day | SUBCUTANEOUS | Status: DC
  Administered 2015-10-26: 25 [IU] via SUBCUTANEOUS
  Filled 2015-10-26: qty 0.25

## 2015-10-26 NOTE — Progress Notes (Signed)
   10/26/15 1500  Clinical Encounter Type  Visited With Patient  Visit Type Initial  Referral From Nurse  Consult/Referral To Chaplain  Spiritual Encounters  Spiritual Needs Emotional  Stress Factors  Patient Stress Factors Other (Comment) (Very tearful)  Family Stress Factors Not reviewed  CHP visited at request of nurse to see if pt. might open up.  Pt. Was very emotional and said it wasn't a good time for a visit.  CHP offered to return later and indicated pt. could contact the nurse if pt. wanted a CHP visit. Roe Coombs 10/26/2015

## 2015-10-26 NOTE — Progress Notes (Signed)
Patient transferred to [phone] with belongings and monitors. Transferred via bed by RN. 5W RN aware of patients arrival.

## 2015-10-26 NOTE — Clinical Social Work Note (Signed)
CSW acknowledges consult for SNF. Waiting on PT recommendations.  Susan [name], [place]

## 2015-10-26 NOTE — Consult Note (Signed)
WOC wound consult note Reason for Consult: LE wounds Wound type: venous stasis ulcer RLE medial  Evidence of other wounds, but closed at this time.  Measurement: 2.5cm x 0.5cm x 0.2cm  Wound bed: 90% yellow fibrin, 10% pink Drainage (amount, consistency, odor) minimal, non purulent Periwound: hemosiderin staining bilaterally, 2+ edema Dressing procedure/placement/frequency: Patient reports she has compression wraps at home, however she has not been able to don them independently recently. She does not have consistent help with this. She reports she was followed a few months ago for ulcer in the wound care center at [facility].  I have contacted the wound care center and verified she was seen this year in February for compression wraps and was DC with a script for Juxta light compression wraps. I have explained that if she does not wear her stockings she will redevelop ulcers.  She seems to understand.    I will have Unna's boots placed while inpatient, will need follow up in the wound care center in one week or HHRN to reassess and reapply in one week.   Discussed POC with patient and bedside nurse.  Re consult if needed, will not follow at this time. Thanks  Susan [name], [place] [phone])

## 2015-10-26 NOTE — Discharge Summary (Deleted)
[facility] Discharge Summary  Patient name: Susan [name] Medical record number: [id number] Date of birth: Jul 26, 1950 Age: 65 y.o. Gender: female Date of Admission: 10/25/2015  Date of Discharge: 10/27/2015 Admitting Physician: Zenia Resides, MD  Primary Care Provider: [name], PA-C Consultants: None  Indication for Hospitalization: Hypothermia, AMS  Discharge Diagnoses/Problem List:  AMS Generalized weakness Hypothermia Acute on chronic kidney failure CAD CHF RBBB DM  Disposition: home  Discharge Condition: stable  Discharge Exam: BP: HR: RR: O2: T: Physical Exam: General: Obese lady sitting in bed, eating breakfast. Cardiovascular: RRR, no murmurs, distal pulses intact,  Respiratory: Breath sounds clear throughout, No wheezes, No rales, No rhonchi Abdomen: distended, no tenderness, no masses, no rebound, no guarding.  Extremities: Lower extremity edema in bilaterally, right leg has 2x1cm ruptured bullae without drainage. Lower extremity erythema.  Neuro: CN 2-12 intact. Bilateral upper ext strength 4/5. Grip strength 3/5 bilaterally. Lower ext strength 4/5. Mild tremor of upper ext bilaterally while extended.   Brief Hospital Course:  Pt presented on 10/25/15 by EMS after having conversation with cardiology office via telephone in which the pt had slurred speech and was altered resulting in pt being brought to ED. Upon arrival in ED pt was hypothermic at 49F and placed on active external warmer resulting in pt's temperature increasing to 56F. Pt was admitted for further evaluation of altered mental status and hypothermia. Upon further discussion it appears she was hypoglycemic to 20 at home, but did not relay this to the admitting providers. Labs showed nl WBC, BUN elevated 98 and acute on chronic kidney injury. BP, HR, O2 sats were WNL. Pt had blood cultures  and urine cultures without any significant growth. Overnight pt's temperature  improved to 26F and her mentation improved. Wound care was consulted to evaluate non-purulent cellulitis in the setting of chronic venous stasis changes on bilateral lower extremities. Pt was given fluids due to being intravascularly depleted. A repeat CXR was performed due to patient having a cough showing no acute findings. Pt. Requested to be discharged to home. She was found to be safe and stable for discharge to home per pt. Request.    Issues for Follow Up:  1. Chronic Venous Stasis/Lower Ext Edema: Follow up in 1 week at wound care center.  2. F/U glucose control and 70/30 insulin dosing to avoid hypoglycemia.  3. F/U fluid status as an outpatient.   Significant Procedures: none.   Significant Labs and Imaging:   Recent Labs Lab 10/25/15 1212 10/26/15 0509  WBC 6.5 6.8  HGB 11.7* 10.5*  HCT 37.4 33.8*  PLT 201 238    Recent Labs Lab 10/23/15 1453 10/25/15 1212 10/26/15 0509 10/27/15 0908  NA 136 138 136 138  K 4.7 4.9 4.2 5.4*  CL 102 106 102 107  CO2 22 21* 23 24  GLUCOSE 104* 211* 234* 351*  BUN 82* 99* 92* 82*  CREATININE 2.51* 2.12* 1.71* 1.60*  CALCIUM 9.1 9.2 8.8* 9.0  ALKPHOS  --  104  --   --   AST  --  29  --   --   ALT  --  27  --   --   ALBUMIN  --  3.2*  --   --       Results/Tests Pending at Time of Discharge: none.   Discharge Medications:    Medication List    TAKE these medications        albuterol 108 (90 Base) MCG/ACT inhaler  Commonly known as:  PROVENTIL HFA;VENTOLIN HFA  Inhale 2 puffs into the lungs every 6 (six) hours as needed for wheezing or shortness of breath.     aspirin EC 81 MG tablet  Take 81 mg by mouth daily.     carvedilol 12.5 MG tablet  Commonly known as:  COREG  Take 1.5 tablets (18.75 mg total) by mouth 2 (two) times daily with a meal.     clonazePAM 1 MG tablet  Commonly known as:  KLONOPIN  Take 1 tablet (1 mg total) by mouth 3 (three) times daily.     clopidogrel 75 MG tablet  Commonly known as:  PLAVIX   TAKE 1 TABLET (75 MG TOTAL) BY MOUTH DAILY WITH BREAKFAST.     doxycycline 100 MG tablet  Commonly known as:  VIBRA-TABS  Take 1 tablet (100 mg total) by mouth every 12 (twelve) hours.     ferrous sulfate 325 (65 FE) MG tablet  Take 325 mg by mouth daily with breakfast. Reported on 08/21/2015     insulin NPH-regular Human (70-30) 100 UNIT/ML injection  Commonly known as:  NOVOLIN 70/30  Inject 12-75 Units into the skin 3 (three) times daily with meals. 60 units in the morning, and 50 with lunch and 12 in the evening     isosorbide dinitrate 10 MG tablet  Commonly known as:  ISORDIL  TAKE 1 TABLET (10 MG TOTAL) BY MOUTH 2 (TWO) TIMES DAILY.     levothyroxine 75 MCG tablet  Commonly known as:  SYNTHROID, LEVOTHROID  Take 1 tablet (75 mcg total) by mouth daily before breakfast.     losartan 50 MG tablet  Commonly known as:  COZAAR  TAKE 1 TABLET BY MOUTH DAILY     metFORMIN 500 MG (MOD) 24 hr tablet  Commonly known as:  GLUMETZA  Take 500 mg by mouth 2 (two) times daily with a meal.     metolazone 5 MG tablet  Commonly known as:  ZAROXOLYN  Take 1 tablet (5 mg total) by mouth once a week. On Wednesday     nitroGLYCERIN 0.4 MG SL tablet  Commonly known as:  NITROSTAT  Place 1 tablet (0.4 mg total) under the tongue every 5 (five) minutes as needed for chest pain.     potassium chloride SA 20 MEQ tablet  Commonly known as:  K-DUR,KLOR-CON  Take 20 mEq by mouth 2 (two) times daily.     pregabalin 150 MG capsule  Commonly known as:  LYRICA  Take 150 mg by mouth 3 (three) times daily. Reported on 09/27/2015     torsemide 20 MG tablet  Commonly known as:  DEMADEX  Take 4 tablets (80 mg total) by mouth 2 (two) times daily.        Discharge Instructions: Please refer to Patient Instructions section of EMR for full details.  Patient was counseled important signs and symptoms that should prompt return to medical care, changes in medications, dietary instructions, activity  restrictions, and follow up appointments.   Follow-Up Appointments: Follow-up Information    Follow up with [facility] BETH, PA-C.   Specialty:  Physician Assistant   Why:  Make an appointment for 1-2 weeks.    Contact information:   [address] [place] [address] [phone]       Aquilla Hacker, MD 10/27/2015, 10:38 AM PGY-2, [place]

## 2015-10-26 NOTE — Progress Notes (Signed)
Orthopedic Tech Progress Note Patient Details:  Susan [name] 1950-07-01 [id number]  Ortho Devices Type of Ortho Device: Louretta Parma boot Ortho Device/Splint Location: (B) LE Ortho Device/Splint Interventions: Ordered, Application   Braulio Bosch 10/26/2015, 4:13 PM

## 2015-10-26 NOTE — Care Management Note (Signed)
Case Management Note  Patient Details  Name: Lucill Abrahamsen MRN: [id number] Date of Birth: 1951/04/27  Subjective/Objective:     Patient is from home, presents with hypothermia, await pt eval.  NCM will cont to follow for  Dc needs.               Action/Plan:   Expected Discharge Date:                  Expected Discharge Plan:  [place]  In-House Referral:     Discharge planning Services  CM Consult  Post Acute Care Choice:    Choice offered to:     DME Arranged:    DME Agency:     HH Arranged:    [place] Agency:     Status of Service:  In process, will continue to follow  Medicare Important Message Given:    Date Medicare IM Given:    Medicare IM give by:    Date Additional Medicare IM Given:    Additional Medicare Important Message give by:     If discussed at [place] of Stay Meetings, dates discussed:    Additional Comments:  Zenon Mayo, RN 10/26/2015, 5:31 PM

## 2015-10-26 NOTE — Care Management Obs Status (Signed)
[facility] NOTIFICATION   Patient Details  Name: Krystine Belen MRN: [id number] Date of Birth: Apr 09, 1951   Medicare Observation Status Notification Given:  Yes    Zenon Mayo, RN 10/26/2015, 10:42 AM

## 2015-10-26 NOTE — Progress Notes (Signed)
Inpatient Diabetes Program Recommendations  AACE/ADA: New Consensus Statement on Inpatient Glycemic Control (2015)  Target Ranges:  Prepandial:   less than 140 mg/dL      Peak postprandial:   less than 180 mg/dL (1-2 hours)      Critically ill patients:  140 - 180 mg/dL  Results for Susan [name], Susan [name] (MRN [id number]) as of 10/26/2015 08:44  Ref. Range 07/12/2015 11:25 07/12/2015 16:24 10/25/2015 21:04 10/25/2015 23:29 10/26/2015 08:10  Glucose-Capillary Latest Ref Range: 65-99 mg/dL 172 (H) 135 (H) 251 (H) 240 (H) 307 (H)   Review of Glycemic Control  Diabetes history: DM2 Outpatient Diabetes medications: NPH 70/30 60 units am, 50 units noon, & 12 units pm + Metformin 500 mg bid Current orders for Inpatient glycemic control: Novolog correction 0-15 units tid + 0-5 units hs  Inpatient Diabetes Program Recommendations:  Please consider starting on basal insulin Lantus 45 units q d (approx. 50% home basal dose). Will follow.  Thank you, Nani Gasser. Melchor Kirchgessner, RN, MSN, CDE Inpatient Glycemic Control Team Team Pager [phone] (8am-5pm) 10/26/2015 8:50 AM

## 2015-10-26 NOTE — Progress Notes (Signed)
Patient ID: Susan [name], female   DOB: 01-Apr-1951, 65 y.o.   MRN: [id number] Family Medicine Teaching Service Daily Progress Note Intern Pager: [phone]  Patient name: Susan [name] Medical record number: [id number] Date of birth: [birth year] Age: 65 y.o. Gender: female  Primary Care Provider: [name], PA-C Consultants: None Code Status: DNR/DNI  Pt Overview and Major Events to Date:  Susan [name] is a 65 y.o. female with extensive PMH of hypothyroidism, DM, HTN, HLD, CHF, CAD, RBBB, MI, peripheral neuropathy, CVA presenting with altered mental status.  EMS was called by patient's [facility] nurse after talking on the phone with patient and noticing that she was altered and had slurred speech. Patient states that she has been feeling well up until today. Denies any recent illness or fevers. Has had some chronic weakness that worsened in recent days. Pt reports that she had felt sluggish. She denies any changes in medication recently besides having an increase in the her lasix dose. She has not taken any medications today but states she has been compliant with her past medications. She mentions that recently she has fallen more and that last time she fell was today as she was walking down the hallway of her house. Pt has peripheral neuropathy causing decreased sensation below the knee bilaterally but is greater in her left leg than right. She denies LOC, head injury, dizziness or any other symptoms. Per nursing pt was tearful and wanted husband to be not be present during part of interview. She denies feeling unsafe in her home but states that he is "not a nice person."  Assessment and Plan: 65yo female w/ PMH of hypothyroidism, DM with peripheral neuropathy, CHF, CAD, CVA presenting with AMS found to be hypothermic at 23F in ED. Improved temperature regulation.   Hypothermia (Resolved) -Unclear etiology. No recent CNS injury, exposure to cold environment, no clear signs of infection or medication  exposures that would warrant temperature dysregulation -Temperature has improved to 77F from 67F upon admission -Blood and urine cultures pending although no leukocytosis or source of infection.  -UDS positive for benzo but pt takes Klonopin daily -Alcohol neg -CXR and UA nl   AMS (resolved) -Likely multifocal in the setting of hypothermia, elevated BUN,  -CT head neg -mini mental exam this AM: passed  AKI -Likely acute on chronic -Hx of ESRD stage 2 -BUN 92<--90 -BUN:Cr >20 concerning for prerenal AKI -Creat 1.71<--2.12 -Will monitor I/O -Total Urine output since Admission 475 -Stopped diuretics. Will give fluids 75cc/hr   Cellulitis  .Bilateral lower extremities with signs of cellulitis. Warm to touch with increased erythema.  -doxycylince 100mg  BID -wound care to see today.   CHF  NICM, EF 55-60% Grade II DD(05/2015). NYHA III symptoms. Volume status elevated. Baseline weight appears to be ~280 lbs; on admission weight 291lbs. Recently seen by cardiology who increased diuretics.  - IV fluid restriction -Daily weights -d/c metolazone and torsemide -BNP ordered -continue HF meds - coreg, isordil, losartan  DM - Last A1c 06/2015 was 9.5.  - repeat A1c - CBG 234. Will monitor.  - Hold home metformin and NPH - Started on lantus today   HTN  -Stable at 128/71 -continue home BP regimen with coreg and losartan  Hypothyroidism -TSH 1.4 -continue current outpatient dose of synthroid   GAD/Insomnia. -Anxious appearing.  -held klonopin overnight due to AMS; restart today at lower dose BID to avoid withdrawal  FEN/GI:  75cc/hr for 6hrs and then repeat CXR   ESRD diet  Replete electrolytes as  needed.   PPx: Subq Hep  Disposition: Will monitor overnight due to weakness but will likely be discharged tomorrow pending social work and PT/OT evaluation.   Subjective:  No acute events over night. Pt had increase in temperature to 97 and has remained stable. This  morning pt states that she feels better than yesterday. No complaints of chest pain, abdominal pain, nausea, and vomiting.   Objective: Temp:  [90.9 F (32.7 C)-98.5 F (36.9 C)] 97.3 F (36.3 C) (06/02 0811) Pulse Rate:  [58-267] 84 (06/02 0811) Resp:  [10-22] 19 (06/02 0811) BP: (92-138)/(29-96) 128/71 mmHg (06/02 0811) SpO2:  [63 %-100 %] 95 % (06/02 0811) Weight:  [130.3 kg (287 lb 4.2 oz)-131.997 kg (291 lb)] 130.3 kg (287 lb 4.2 oz) (06/01 2101) Physical Exam: General: Obese lady sitting in bed, eating breakfast. Cardiovascular: RRR, no murmurs, distal pulses intact,  Respiratory: Breath sounds clear throughout,  Abdomen: distended, no tenderness, no masses, no rebound, no guarding.  Extremities: Lower extremity edema in bilaterally, right leg has 2x1cm ruptured bullae without drainage. Lower extremity erythema   Neuro: CN 2-12 intact. Bilateral upper ext strength 4/5. Grip strength 3/5 bilaterally. Lower ext strength 4/5. Mild tremor of upper ext bilaterally while extended.   Laboratory:  Recent Labs Lab 10/25/15 1212 10/26/15 0509  WBC 6.5 6.8  HGB 11.7* 10.5*  HCT 37.4 33.8*  PLT 201 238    Recent Labs Lab 10/23/15 1453 10/25/15 1212 10/26/15 0509  NA 136 138 136  K 4.7 4.9 4.2  CL 102 106 102  CO2 22 21* 23  BUN 82* 99* 92*  CREATININE 2.51* 2.12* 1.71*  CALCIUM 9.1 9.2 8.8*  PROT  --  7.0  --   BILITOT  --  0.5  --   ALKPHOS  --  104  --   ALT  --  27  --   AST  --  29  --   GLUCOSE 104* 211* 234*      Imaging/Diagnostic Tests: Dg Chest 2 View  10/25/2015 CLINICAL DATA: Altered mental status EXAM: CHEST 2 VIEW COMPARISON: July 09, 2015 FINDINGS: There is no edema or consolidation. Heart is mildly enlarged with pulmonary vascularity within normal limits. No adenopathy. There is an exostosis arising from the anterior left first rib, stable. IMPRESSION: Stable cardiomegaly. No edema or consolidation. Electronically Signed By: Lowella Grip  III M.D. On: 10/25/2015 12:58   Ct Head Wo Contrast  10/25/2015 CLINICAL DATA: Patient with altered mental status. EXAM: CT HEAD WITHOUT CONTRAST TECHNIQUE: Contiguous axial images were obtained from the base of the skull through the vertex without intravenous contrast. COMPARISON: Brain CT 10/21/2013. FINDINGS: Ventricles and sulci are appropriate for patient's age. No evidence for acute cortically based infarct, intracranial hemorrhage, mass lesion or mass-effect. Periventricular and subcortical white matter hypodensity compatible with chronic microvascular ischemic changes. Encephalomalacia within the right cerebellar hemisphere compatible with remote infarct. Orbits are unremarkable. Paranasal sinuses are unremarkable. Mastoid air cells unremarkable. Calvarium is intact. IMPRESSION: No acute intracranial process. Chronic microvascular ischemic changes. Electronically Signed By: [name] M.D. On: 10/25/2015 12:48    [place], Med Student 10/26/2015, 8:48 AM PGY-4, [place] Intern pager: [phone], text pages welcome  Resident Addendum I have separately seen and examined the patient.  I have discussed the findings and exam with the medical student and agree with the above note.  I helped develop the management plan that is described in the student's note and I agree with the content.  I have outlined my exam, assessment, and plan below:  65yo female feeling much better than admission. Reports she would like to go home, although understands that further monitoring is necessary. Continues to note mild productive cough since this morning.  General: 65yo female in no apparent distress Cardiac: Regular rate and rhythm, no murmur Resp: No wheezing note, no increased respiratory distress Ext: Unna boots being placed, increased erythema and warmth noted bilateral lower extremities  Assessment/Plan: # Hypothermia: Resolved. Suspect environmental etiology with  several sedative and vasodilating medications. Infectious source also possible. Will repeat CXR following hydration to evaluate for pneumonia given hypothermia and cough. Blood cultures pending. Urine culture with insignificant growth. Restart Klonopin given improved mental status. Social work evaluating for possible SNF placement, awaiting PT recommendations.  # AKI: Suspect pre-renal. Will give gentle fluids and monitor. Hold diuretics. BMP in am. Holding Losartan.  # Lower Extremity Cellulitis: WOC consulted and placed unna boots. Continue Doxycycline.   # Diabetes: Currently prescribed moderate insulin sliding scale and Lantus25units. Will monitor amount of sliding scale needed and adjust lantus tonight.  Dr. Gerlean Ren 10/26/15, 3:47 PM

## 2015-10-27 DIAGNOSIS — R4182 Altered mental status, unspecified: Secondary | ICD-10-CM | POA: Diagnosis not present

## 2015-10-27 LAB — BASIC METABOLIC PANEL
Anion gap: 7 (ref 5–15)
BUN: 82 mg/dL — ABNORMAL HIGH (ref 6–20)
CO2: 24 mmol/L (ref 22–32)
Calcium: 9 mg/dL (ref 8.9–10.3)
Chloride: 107 mmol/L (ref 101–111)
Creatinine, Ser: 1.6 mg/dL — ABNORMAL HIGH (ref 0.44–1.00)
GFR calc Af Amer: 38 mL/min — ABNORMAL LOW (ref >60)
GFR calc non Af Amer: 33 mL/min — ABNORMAL LOW (ref >60)
Glucose, Bld: 351 mg/dL — ABNORMAL HIGH (ref 65–99)
Potassium: 5.4 mmol/L — ABNORMAL HIGH (ref 3.5–5.1)
Sodium: 138 mmol/L (ref 135–145)

## 2015-10-27 LAB — HEMOGLOBIN A1C
Hgb A1c MFr Bld: 9.3 % — ABNORMAL HIGH (ref 4.8–5.6)
Mean Plasma Glucose: 220 mg/dL

## 2015-10-27 LAB — CBC
HCT: 36 % (ref 36.0–46.0)
Hemoglobin: 11 g/dL — ABNORMAL LOW (ref 12.0–15.0)
MCH: 24.6 pg — ABNORMAL LOW (ref 26.0–34.0)
MCHC: 30.6 g/dL (ref 30.0–36.0)
MCV: 80.5 fL (ref 78.0–100.0)
Platelets: 198 10*3/uL (ref 150–400)
RBC: 4.47 MIL/uL (ref 3.87–5.11)
RDW: 17.9 % — ABNORMAL HIGH (ref 11.5–15.5)
WBC: 5.1 10*3/uL (ref 4.0–10.5)

## 2015-10-27 LAB — GLUCOSE, CAPILLARY
Glucose-Capillary: 340 mg/dL — ABNORMAL HIGH (ref 65–99)
Glucose-Capillary: 394 mg/dL — ABNORMAL HIGH (ref 65–99)
Glucose-Capillary: 326 mg/dL — ABNORMAL HIGH (ref 65–99)
Glucose-Capillary: 222 mg/dL — ABNORMAL HIGH (ref 65–99)

## 2015-10-27 MED ORDER — DOXYCYCLINE HYCLATE 100 MG PO TABS: 100.0000 mg | ORAL_TABLET | Freq: Two times a day (BID) | ORAL | Status: DC

## 2015-10-27 NOTE — Progress Notes (Signed)
Patient ID: Susan [name], female   DOB: December 21, 1950, 65 y.o.   MRN: [id number] Family Medicine Teaching Service Daily Progress Note Intern Pager: [phone]  Patient name: Susan [name] Medical record number: [id number] Date of birth: [birth year] Age: 65 y.o. Gender: female  Primary Care Provider: [name], PA-C Consultants: None Code Status: DNR/DNI  Pt Overview and Major Events to Date:  Susan [name] is a 65 y.o. female with extensive PMH of hypothyroidism, DM, HTN, HLD, CHF, CAD, RBBB, MI, peripheral neuropathy, CVA presenting with altered mental status.   Assessment and Plan: 65yo female w/ PMH of hypothyroidism, DM with peripheral neuropathy, CHF, CAD, CVA presenting with AMS found to be hypothermic at 21F in ED. Improved temperature regulation.   Hypothermia (Resolved) - this am she reports hypoglycemia to 20 on the day of admission, which she failed to mention. May explain her hypothermia, and would certainly explain AMS.  -Unclear etiology. No recent CNS injury, exposure to cold environment, no clear signs of infection or medication exposures that would warrant temperature dysregulation -Temperature has improved to 42F from 71F upon admission -Blood and urine cultures pending although no leukocytosis or source of infection.  -UDS positive for benzo but pt takes Klonopin daily -Alcohol neg -CXR and UA nl   AMS (resolved) - likely 2/2 hypoglycemia.  -Likely multifocal in the setting of hypothermia, hypoglycemia -CT head neg -mini mental exam this AM: passed - TSH normal.   AKI - resolving -Likely acute on chronic -Hx of ESRD stage 2 -BUN 92<--90 -BUN:Cr >20 concerning for prerenal AKI -Creat 1.71<--2.12   Cellulitis  .Bilateral lower extremities with signs of cellulitis. Warm to touch with increased erythema.  -doxycylince 100mg  BID -wound care to see today.   - Unna boot placed.   CHF  NICM, EF 55-60% Grade II DD(05/2015). NYHA III symptoms. Volume status  elevated. Baseline weight appears to be ~280 lbs; on admission weight 291lbs. Recently seen by cardiology who increased diuretics.  - IV fluid restriction -Daily weights -d/c metolazone and torsemide -BNP ordered -continue HF meds - coreg, isordil, losartan  DM - Last A1c 06/2015 was 9.5.  - repeat A1c - CBG 234. Will monitor.  - Hold home metformin and NPH - Started on lantus   HTN  -Stable at 128/71 -continue home BP regimen with coreg and losartan  Hypothyroidism -TSH 1.4 -continue current outpatient dose of synthroid   GAD/Insomnia. -Anxious appearing.  -held klonopin overnight due to AMS; restart today at lower dose BID to avoid withdrawal  FEN/GI:  75cc/hr for 6hrs and then repeat CXR   ESRD diet  Replete electrolytes as needed.   PPx: Subq Hep  Disposition: pending pt/ot eval  Subjective:  Feeling better this am, mildly wheezy. No other complaints.   Objective: Temp:  [97.6 F (36.4 C)-98.2 F (36.8 C)] 98.2 F (36.8 C) (06/03 0544) Pulse Rate:  [79-85] 79 (06/03 0544) Resp:  [16-19] 17 (06/03 0544) BP: (129-152)/(50-64) 141/62 mmHg (06/03 0544) SpO2:  [95 %-97 %] 96 % (06/03 0544) Weight:  [296 lb 15.4 oz (134.7 kg)] 296 lb 15.4 oz (134.7 kg) (06/02 2042) Physical Exam: General: Obese lady sitting in bed Cardiovascular: RRR, no murmurs, distal pulses intact,  Respiratory: Breath sounds clear throughout,  Abdomen: distended, no tenderness, no masses, no rebound, no guarding.  Extremities: Lower extremity edema in bilaterally, right leg has 2x1cm ruptured bullae without drainage. Lower extremity erythema  , now with unna boots.  Neuro: CN 2-12 intact. Bilateral upper ext strength 4/5. Grip strength  3/5 bilaterally. Lower ext strength 4/5. Mild tremor of upper ext bilaterally while extended.   Laboratory:  Recent Labs Lab 10/25/15 1212 10/26/15 0509  WBC 6.5 6.8  HGB 11.7* 10.5*  HCT 37.4 33.8*  PLT 201 238    Recent Labs Lab  10/23/15 1453 10/25/15 1212 10/26/15 0509  NA 136 138 136  K 4.7 4.9 4.2  CL 102 106 102  CO2 22 21* 23  BUN 82* 99* 92*  CREATININE 2.51* 2.12* 1.71*  CALCIUM 9.1 9.2 8.8*  PROT  --  7.0  --   BILITOT  --  0.5  --   ALKPHOS  --  104  --   ALT  --  27  --   AST  --  29  --   GLUCOSE 104* 211* 234*      Imaging/Diagnostic Tests: Dg Chest 2 View  10/25/2015 CLINICAL DATA: Altered mental status EXAM: CHEST 2 VIEW COMPARISON: July 09, 2015 FINDINGS: There is no edema or consolidation. Heart is mildly enlarged with pulmonary vascularity within normal limits. No adenopathy. There is an exostosis arising from the anterior left first rib, stable. IMPRESSION: Stable cardiomegaly. No edema or consolidation. Electronically Signed By: Lowella Grip III M.D. On: 10/25/2015 12:58   Ct Head Wo Contrast  10/25/2015 CLINICAL DATA: Patient with altered mental status. EXAM: CT HEAD WITHOUT CONTRAST TECHNIQUE: Contiguous axial images were obtained from the base of the skull through the vertex without intravenous contrast. COMPARISON: Brain CT 10/21/2013. FINDINGS: Ventricles and sulci are appropriate for patient's age. No evidence for acute cortically based infarct, intracranial hemorrhage, mass lesion or mass-effect. Periventricular and subcortical white matter hypodensity compatible with chronic microvascular ischemic changes. Encephalomalacia within the right cerebellar hemisphere compatible with remote infarct. Orbits are unremarkable. Paranasal sinuses are unremarkable. Mastoid air cells unremarkable. Calvarium is intact. IMPRESSION: No acute intracranial process. Chronic microvascular ischemic changes. Electronically Signed By: [name] M.D. On: 10/25/2015 12:48    Aquilla Hacker, MD 10/27/2015, 8:46 AM PGY-2, [place] Intern pager: [phone], text pages welcome

## 2015-10-27 NOTE — Evaluation (Signed)
Physical Therapy Evaluation Patient Details Name: Susan [name] MRN: [id number] DOB: 1950-09-01 Today's Date: 10/27/2015   History of Present Illness  Susan [name] is a 65 y.o. female with extensive PMH of hypothyroidism, DM, HTN, HLD, CHF, CAD, RBBB, MI, peripheral neuropathy, CVA presenting with altered mental status. hypother  Clinical Impression  Pt admitted with above diagnosis. Pt currently with functional limitations due to the deficits listed below (see PT Problem List).  Pt will benefit from skilled PT to increase their independence and safety with mobility to allow discharge to the venue listed below.       Follow Up Recommendations Home health PT;Supervision/Assistance - 24 hour    Equipment Recommendations  Rolling walker with 5" wheels;3in1 (PT)    Recommendations for Other Services OT consult     Precautions / Restrictions Precautions Precautions: Fall Precaution Comments: Fall risk greatly reduced with use of RW Restrictions Weight Bearing Restrictions: No      Mobility  Bed Mobility                  Transfers Overall transfer level: Needs assistance Equipment used: Rolling walker (2 wheeled) Transfers: Sit to/from Stand Sit to Stand: Mod assist         General transfer comment: Light mod assist to power up; cues for safety and hand placement  Ambulation/Gait Ambulation/Gait assistance: Min guard Ambulation Distance (Feet): 120 Feet Assistive device: Rolling walker (2 wheeled) Gait Pattern/deviations: Step-through pattern;Decreased step length - right;Decreased step length - left;Decreased stride length     General Gait Details: Cues for RW use and to self-monitor for activity tolernace  Stairs            Wheelchair Mobility    Modified Rankin (Stroke Patients Only)       Balance                                             Pertinent Vitals/Pain Pain Assessment: No/denies pain    Home Living Family/patient  expects to be discharged to:: Private residence Living Arrangements: Spouse/significant other;Children Available Help at Discharge: Family;Available PRN/intermittently Type of Home: Mobile home Home Access: Ramped entrance     Home Layout: One level        Prior Function Level of Independence: Independent         Comments: Pt and daughter report more difficulty recently and some falls     Hand Dominance        Extremity/Trunk Assessment   Upper Extremity Assessment: Overall WFL for tasks assessed           Lower Extremity Assessment: Generalized weakness         Communication   Communication: No difficulties  Cognition Arousal/Alertness: Awake/alert Behavior During Therapy: WFL for tasks assessed/performed Overall Cognitive Status: Within Functional Limits for tasks assessed                      General Comments      Exercises        Assessment/Plan    PT Assessment Patient needs continued PT services  PT Diagnosis Difficulty walking;Generalized weakness   PT Problem List Decreased strength;Decreased activity tolerance;Decreased balance;Decreased mobility;Decreased knowledge of use of DME;Obesity;Decreased knowledge of precautions  PT Treatment Interventions DME instruction;Gait training;Functional mobility training;Therapeutic activities;Therapeutic exercise;Balance training;Patient/family education   PT Goals (Current goals can be found in the Care  Plan section) Acute Rehab PT Goals Patient Stated Goal: Hopes to get home PT Goal Formulation: With patient/family Time For Goal Achievement: 11/10/15 Potential to Achieve Goals: Good    Frequency Min 3X/week   Barriers to discharge        Co-evaluation               End of Session Equipment Utilized During Treatment: Gait belt Activity Tolerance: Patient tolerated treatment well Patient left: in chair;with call bell/phone within reach;with chair alarm set Nurse Communication:  Mobility status    Functional Assessment Tool Used: Clinical Judgement Functional Limitation: Mobility: Walking and moving around Mobility: Walking and Moving Around Current Status [phone]): At least 1 percent but less than 20 percent impaired, limited or restricted Mobility: Walking and Moving Around Goal Status [phone]): 0 percent impaired, limited or restricted    Time: 1236-1256 PT Time Calculation (min) (ACUTE ONLY): 20 min   Charges:   PT Evaluation $PT Eval Moderate Complexity: 1 Procedure     PT G Codes:   PT G-Codes **NOT FOR INPATIENT CLASS** Functional Assessment Tool Used: Clinical Judgement Functional Limitation: Mobility: Walking and moving around Mobility: Walking and Moving Around Current Status [id number]): At least 1 percent but less than 20 percent impaired, limited or restricted Mobility: Walking and Moving Around Goal Status [phone]): 0 percent impaired, limited or restricted    Roney Marion Hamff 10/27/2015, 3:20 PM  Roney Marion, [place] Pager [phone] Office [phone]

## 2015-10-27 NOTE — Discharge Summary (Signed)
[facility] Discharge Summary  Patient name: Susan [name] record number: [id number] Date of birth: 1952-03-25Age: 65 y.o.Gender: female Date of Admission: 6/1/2017Date of Discharge: 10/27/2015 Admitting Physician: Zenia Resides, MD  Primary Care Provider: [name], PA-C Consultants: None  Indication for Hospitalization: Hypothermia, AMS  Discharge Diagnoses/Problem List:  AMS Generalized weakness Hypothermia Acute on chronic kidney failure CAD CHF RBBB DM  Disposition: home  Discharge Condition: stable  Discharge Exam:  Physical Exam: General: Obese, laying in bed, in NAD Cardiovascular: RRR, no murmurs, distal pulses intact Respiratory: Normal work of breathing, mild crackles in lung bases bilaterally. Abdomen: distended, no tenderness, no masses, no rebound, no guarding.  Extremities: Lower extremity edema in bilaterally, unna boots in place bilaterally Neuro: CN 2-12 intact. AAOx3.  Brief Hospital Course:  Pt presented on 10/25/15 by EMS after having conversation with cardiology office via telephone in which the pt had slurred speech and was altered resulting in pt being brought to ED. Upon arrival in ED pt was hypothermic to 32F and placed on active external warmer resulting in pt's temperature increasing to 36F. Pt was admitted for further evaluation of altered mental status and hypothermia. Upon further discussion it appears she was hypoglycemic to 20 at home on the morning of admission, but did not relay this to the admitting providers. Labs showed nl WBC, BUN elevated 98 and acute on chronic kidney injury. BP, HR, O2 sats were WNL. Pt had blood culturesand urine cultures without any significant growth. Overnight pt's temperature improved to 10F and her mentation improved. Wound care was consulted to evaluate non-purulent cellulitis in the setting of chronic venous stasis changes  on bilateral lower extremities. Pt was given fluids due to being intravascularly depleted. A repeat CXR was performed due to patient having a cough showing no acute findings. Pt. Requested to be discharged to home. She was found to be safe and stable for discharge to home per pt. Request.   Issues for Follow Up:  1. Chronic Venous Stasis/Lower Ext Edema: Pt decided home health nurse for unna boot changes. Pt instructed to follow-up in PCP's office for unna boot change in 1 week. 2. F/U glucose control and 70/30 insulin dosing. Pt discharged on the following insulin regimen: Novolin 70/30 35 units in the morning, 30 units at lunch, 12 units in the evening. 3. F/U fluid status as an outpatient. Pt discharged home on Torsemide 80mg  daily. 4.  Pt was evaluated by PT/OT. She declined all home health services, including home health PT, 3 in 1 commode, 2 wheel rolling walker, and home health nurse for unna boot changes.  Significant Procedures: none.   Significant Labs and Imaging:   Last Labs      Recent Labs Lab 10/25/15 1212 10/26/15 0509  WBC 6.5 6.8  HGB 11.7* 10.5*  HCT 37.4 33.8*  PLT 201 238      Last Labs      Recent Labs Lab 10/23/15 1453 10/25/15 1212 10/26/15 0509 10/27/15 0908  NA 136 138 136 138  K 4.7 4.9 4.2 5.4*  CL 102 106 102 107  CO2 22 21* 23 24  GLUCOSE 104* 211* 234* 351*  BUN 82* 99* 92* 82*  CREATININE 2.51* 2.12* 1.71* 1.60*  CALCIUM 9.1 9.2 8.8* 9.0  ALKPHOS --  104 --  --   AST --  29 --  --   ALT --  27 --  --   ALBUMIN --  3.2* --  --  Results/Tests Pending at Time of Discharge: none.   Discharge Medications:    Medication List    TAKE these medications       albuterol 108 (90 Base) MCG/ACT inhaler  Commonly known as: PROVENTIL HFA;VENTOLIN HFA  Inhale 2 puffs into the lungs every 6 (six) hours as needed for wheezing or  shortness of breath.     aspirin EC 81 MG tablet  Take 81 mg by mouth daily.     carvedilol 12.5 MG tablet  Commonly known as: COREG  Take 1.5 tablets (18.75 mg total) by mouth 2 (two) times daily with a meal.     clonazePAM 1 MG tablet  Commonly known as: KLONOPIN  Take 1 tablet (1 mg total) by mouth 3 (three) times daily.     clopidogrel 75 MG tablet  Commonly known as: PLAVIX  TAKE 1 TABLET (75 MG TOTAL) BY MOUTH DAILY WITH BREAKFAST.     doxycycline 100 MG tablet  Commonly known as: VIBRA-TABS  Take 1 tablet (100 mg total) by mouth every 12 (twelve) hours.     ferrous sulfate 325 (65 FE) MG tablet  Take 325 mg by mouth daily with breakfast. Reported on 08/21/2015     insulin NPH-regular Human (70-30) 100 UNIT/ML injection  Commonly known as: NOVOLIN 70/30  Inject 12-75 Units into the skin 3 (three) times daily with meals. 60 units in the morning, and 50 with lunch and 12 in the evening     isosorbide dinitrate 10 MG tablet  Commonly known as: ISORDIL  TAKE 1 TABLET (10 MG TOTAL) BY MOUTH 2 (TWO) TIMES DAILY.     levothyroxine 75 MCG tablet  Commonly known as: SYNTHROID, LEVOTHROID  Take 1 tablet (75 mcg total) by mouth daily before breakfast.     losartan 50 MG tablet  Commonly known as: COZAAR  TAKE 1 TABLET BY MOUTH DAILY     metFORMIN 500 MG (MOD) 24 hr tablet  Commonly known as: GLUMETZA  Take 500 mg by mouth 2 (two) times daily with a meal.     metolazone 5 MG tablet  Commonly known as: ZAROXOLYN  Take 1 tablet (5 mg total) by mouth once a week. On Wednesday     nitroGLYCERIN 0.4 MG SL tablet  Commonly known as: NITROSTAT  Place 1 tablet (0.4 mg total) under the tongue every 5 (five) minutes as needed for chest pain.     potassium chloride SA 20 MEQ tablet  Commonly known as: K-DUR,KLOR-CON  Take 20 mEq by mouth 2 (two) times daily.     pregabalin 150 MG capsule   Commonly known as: LYRICA  Take 150 mg by mouth 3 (three) times daily. Reported on 09/27/2015     torsemide 20 MG tablet  Commonly known as: DEMADEX  Take 4 tablets (80 mg total) by mouth 2 (two) times daily.        Discharge Instructions: Please refer to Patient Instructions section of EMR for full details. Patient was counseled important signs and symptoms that should prompt return to medical care, changes in medications, dietary instructions, activity restrictions, and follow up appointments.   Follow-Up Appointments: Follow-up Information    Follow up with [facility] BETH, PA-C.   Specialty: Physician Assistant   Why: Make an appointment for 1 week    Contact information:   [place] [address] [phone]       Hyman Bible, MD 10/27/2015, 10:38 AM PGY-1, [place]

## 2015-10-28 DIAGNOSIS — R0602 Shortness of breath: Secondary | ICD-10-CM | POA: Diagnosis not present

## 2015-10-28 DIAGNOSIS — R269 Unspecified abnormalities of gait and mobility: Secondary | ICD-10-CM | POA: Diagnosis not present

## 2015-10-28 DIAGNOSIS — R4182 Altered mental status, unspecified: Secondary | ICD-10-CM | POA: Diagnosis not present

## 2015-10-28 DIAGNOSIS — N183 Chronic kidney disease, stage 3 (moderate): Secondary | ICD-10-CM | POA: Diagnosis not present

## 2015-10-28 DIAGNOSIS — I5033 Acute on chronic diastolic (congestive) heart failure: Secondary | ICD-10-CM | POA: Diagnosis not present

## 2015-10-28 LAB — CBC
HCT: 32.9 % — ABNORMAL LOW (ref 36.0–46.0)
Hemoglobin: 10.2 g/dL — ABNORMAL LOW (ref 12.0–15.0)
MCH: 24.5 pg — ABNORMAL LOW (ref 26.0–34.0)
MCHC: 31 g/dL (ref 30.0–36.0)
MCV: 79.1 fL (ref 78.0–100.0)
Platelets: 219 10*3/uL (ref 150–400)
RBC: 4.16 MIL/uL (ref 3.87–5.11)
RDW: 17.6 % — ABNORMAL HIGH (ref 11.5–15.5)
WBC: 6.2 10*3/uL (ref 4.0–10.5)

## 2015-10-28 LAB — BASIC METABOLIC PANEL
Anion gap: 8 (ref 5–15)
BUN: 63 mg/dL — ABNORMAL HIGH (ref 6–20)
CO2: 24 mmol/L (ref 22–32)
Calcium: 9.2 mg/dL (ref 8.9–10.3)
Chloride: 109 mmol/L (ref 101–111)
Creatinine, Ser: 1.2 mg/dL — ABNORMAL HIGH (ref 0.44–1.00)
GFR calc Af Amer: 53 mL/min — ABNORMAL LOW (ref >60)
GFR calc non Af Amer: 46 mL/min — ABNORMAL LOW (ref >60)
Glucose, Bld: 177 mg/dL — ABNORMAL HIGH (ref 65–99)
Potassium: 4.5 mmol/L (ref 3.5–5.1)
Sodium: 141 mmol/L (ref 135–145)

## 2015-10-28 LAB — GLUCOSE, CAPILLARY
Glucose-Capillary: 230 mg/dL — ABNORMAL HIGH (ref 65–99)
Glucose-Capillary: 261 mg/dL — ABNORMAL HIGH (ref 65–99)

## 2015-10-28 MED ORDER — INSULIN GLARGINE 100 UNIT/ML ~~LOC~~ SOLN
40.0000 [IU] | Freq: Every day | SUBCUTANEOUS | Status: DC
  Administered 2015-10-28: 40 [IU] via SUBCUTANEOUS
  Filled 2015-10-28: qty 0.4

## 2015-10-28 MED ORDER — TORSEMIDE 20 MG PO TABS: 80.0000 mg | ORAL_TABLET | Freq: Every day | ORAL | Status: DC

## 2015-10-28 MED ORDER — TORSEMIDE 20 MG PO TABS: 40.0000 mg | ORAL_TABLET | Freq: Every day | ORAL | Status: DC

## 2015-10-28 MED ORDER — INSULIN NPH ISOPHANE & REGULAR (70-30) 100 UNIT/ML ~~LOC~~ SUSP: 12.0000 [IU] | Freq: Three times a day (TID) | SUBCUTANEOUS | Status: DC

## 2015-10-28 MED ORDER — DOXYCYCLINE HYCLATE 100 MG PO TABS: 100.0000 mg | ORAL_TABLET | Freq: Two times a day (BID) | ORAL | Status: DC

## 2015-10-28 NOTE — Progress Notes (Signed)
Occupational Therapy Evaluation Patient Details Name: Susan [name] MRN: [id number] DOB: Sep 25, 1950 Today's Date: 10/28/2015    History of Present Illness Susan [name] is a 65 y.o. female with extensive PMH of hypothyroidism, DM, HTN, HLD, CHF, CAD, RBBB, MI, peripheral neuropathy, CVA presenting with altered mental status. hypother   Clinical Impression   PTA, pt was independent with ADLs and mobility. Pt currently requires set up-min assist for ADLs and min guard assist for mobility due to balance deficits. Pt with significantly decreased fall risk when using RW. Pt plans to d/c home with 24/7 assistance from her husband and son. Pt will benefit from continued acute OT to increase independence and safety with ADLs and mobility to allow for safe discharge home. Recommend HHOT and RW, 3in1 for home use.    Follow Up Recommendations  Home health OT;Supervision/Assistance - 24 hour    Equipment Recommendations  3 in 1 bedside comode;Other (comment) (RW-2 wheeled)    Recommendations for Other Services       Precautions / Restrictions Precautions Precautions: Fall Restrictions Weight Bearing Restrictions: No      Mobility Bed Mobility Overal bed mobility: Modified Independent                Transfers Overall transfer level: Needs assistance Equipment used: Rolling walker (2 wheeled) Transfers: Sit to/from Stand Sit to Stand: Min guard         General transfer comment: Min guard assist for safety. VCs for safe hand placement and RW use    Balance Overall balance assessment: Needs assistance Sitting-balance support: No upper extremity supported;Feet supported Sitting balance-Leahy Scale: Good     Standing balance support: No upper extremity supported;During functional activity Standing balance-Leahy Scale: Fair                              ADL Overall ADL's : Needs assistance/impaired     Grooming: Wash/dry hands;Wash/dry face;Set up;Standing    Upper Body Bathing: Set up;Standing   Lower Body Bathing: Minimal assistance;Sit to/from stand   Upper Body Dressing : Set up;Sitting   Lower Body Dressing: Minimal assistance;Sit to/from stand   Toilet Transfer: Min guard;Cueing for safety;Ambulation;BSC;RW   Toileting- Water quality scientist and Hygiene: Min guard;Sit to/from stand       Functional mobility during ADLs: Min guard;Rolling walker       Vision Vision Assessment?: No apparent visual deficits   Perception     Praxis      Pertinent Vitals/Pain Pain Assessment: No/denies pain     Hand Dominance Right   Extremity/Trunk Assessment Upper Extremity Assessment Upper Extremity Assessment: Overall WFL for tasks assessed   Lower Extremity Assessment Lower Extremity Assessment: Generalized weakness   Cervical / Trunk Assessment Cervical / Trunk Assessment: Normal   Communication Communication Communication: No difficulties   Cognition Arousal/Alertness: Awake/alert Behavior During Therapy: WFL for tasks assessed/performed Overall Cognitive Status: Within Functional Limits for tasks assessed                     General Comments       Exercises       Shoulder Instructions      Home Living Family/patient expects to be discharged to:: Private residence Living Arrangements: Spouse/significant other;Children Available Help at Discharge: Family;Available PRN/intermittently Type of Home: Mobile home Home Access: Ramped entrance     Home Layout: One level     Bathroom Shower/Tub: Occupational psychologist: Standard  Home Equipment: Shower seat;Shower seat - built in;Hand held Veterinary surgeon - single point          Prior Functioning/Environment Level of Independence: Independent        Comments: Has had recent falls    OT Diagnosis: Generalized weakness   OT Problem List: Decreased strength;Decreased range of motion;Decreased activity tolerance;Impaired balance  (sitting and/or standing);Decreased safety awareness;Decreased knowledge of use of DME or AE;Decreased knowledge of precautions   OT Treatment/Interventions: Self-care/ADL training;Therapeutic exercise;DME and/or AE instruction;Energy conservation;Therapeutic activities;Patient/family education;Balance training    OT Goals(Current goals can be found in the care plan section) Acute Rehab OT Goals Patient Stated Goal: Hopes to get home OT Goal Formulation: With patient Time For Goal Achievement: 11/11/15 Potential to Achieve Goals: Good ADL Goals Pt Will Perform Upper Body Bathing: with modified independence;sitting Pt Will Perform Lower Body Bathing: with modified independence;sit to/from stand Pt Will Transfer to Toilet: with modified independence;ambulating;regular height toilet Pt Will Perform Toileting - Clothing Manipulation and hygiene: with modified independence;sit to/from stand;sitting/lateral leans  OT Frequency: Min 2X/week   Barriers to D/C:            Co-evaluation              End of Session Equipment Utilized During Treatment: Gait belt;Rolling walker Nurse Communication: Mobility status  Activity Tolerance: Patient tolerated treatment well Patient left: in bed;with call bell/phone within reach;with bed alarm set   Time: [id number] OT Time Calculation (min): 33 min Charges:  OT General Charges $OT Visit: 1 Procedure OT Evaluation $OT Eval Moderate Complexity: 1 Procedure OT Treatments $Self Care/Home Management : 8-22 mins G-Codes: OT G-codes **NOT FOR INPATIENT CLASS** Functional Assessment Tool Used: clinical judgement Functional Limitation: Self care Self Care Current Status [id number]): At least 1 percent but less than 20 percent impaired, limited or restricted Self Care Goal Status [id number]): At least 1 percent but less than 20 percent impaired, limited or restricted  Redmond Baseman, OTR/L Pager: [phone] 10/28/2015, 1:35 PM

## 2015-10-28 NOTE — Care Management Note (Addendum)
Case Management Note  Patient Details  Name: Gurnoor Lahn MRN: [id number] Date of Birth: 05/14/1951  Subjective/Objective:                    Action/Plan:  MD aware by CM pt's refusal of home health services 2/2 to copay cost . MD stated pt is to f/u with PCP once d/c.   10/28/2015 @ 1:08pm CM received call from [facility] and was informed pt declined home health services 2/2 copay cost.States she can't afford it.  Plan is to d/c to home today.  Expected Discharge Date:   10/28/2015           Expected Discharge Plan:  [place]  In-House Referral:     Discharge planning Services  CM Consult  Post Acute Care Choice:    Choice offered to:     DME Arranged:   10/28/2015 @ 1630, rolling walker and 3 in 1 DME Agency:   [place] @[phone]  HH Arranged:  RN, PT [facility] Agency:  [place] @ [phone]  Status of Service:  Completed, signed off  Medicare Important Message Given:    Date Medicare IM Given:    Medicare IM give by:    Date Additional Medicare IM Given:    Additional Medicare Important Message give by:     If discussed at [place] of Stay Meetings, dates discussed:    Additional Comments:  Sharin Mons, [place] [phone] 10/28/2015, 12:50 PM

## 2015-10-28 NOTE — Discharge Instructions (Signed)
You were hospitalized because you had very low body temperature. We were worried that this was caused by an infection of your blood stream, but all of our tests showed that you did not have an infection. We think your body temperature was low because you had a low blood sugar that morning.  It is very important to make sure that you eat regular meals while taking your insulin. We have decreased your insulin regimen to help prevent low blood sugars in the future. You should take Novolin 70/30 as follows: 35 units in the morning, 30 units at lunch, and 12 units in the evening. Your primary care provider may want to slowly increase these doses.  We also decreased the dose of your fluid pills because you were dry when you came in. Please take Torsemide 80mg  daily and stop taking the Metolazone for now. Your primary care provider may also choose to increase these doses at your follow-up appointment.   You also have a skin infection of your lower legs. We prescribed an antibiotic called Doxycycline. Please take 1 pill twice a day until the prescription runs out.  It was a pleasure taking care of you during your hospitalization.

## 2015-10-28 NOTE — Progress Notes (Signed)
Patient ID: Susan [name], female   DOB: 1950/10/24, 65 y.o.   MRN: [id number] Family Medicine Teaching Service Daily Progress Note Intern Pager: [phone]  Patient name: Susan [name] Medical record number: [id number] Date of birth: [birth year] Age: 65 y.o. Gender: female  Primary Care Provider: [name], PA-C Consultants: None Code Status: DNR/DNI  Pt Overview and Major Events to Date:  Susan [name] is a 65 y.o. female with extensive PMH of hypothyroidism, DM, HTN, HLD, CHF, CAD, RBBB, MI, peripheral neuropathy, CVA presenting with altered mental status.   Assessment and Plan: 65yo female w/ PMH of hypothyroidism, DM with peripheral neuropathy, CHF, CAD, CVA presenting with AMS found to be hypothermic at 32F in ED. Improved temperature regulation.   Hypothermia (Resolved) - Likely related to a blood sugar of 20 at home on the day of admission. No signs of infection, no changes in meds. Temperatures have been normal. -Blood cultures with no growth. Urine cultures with <10,000 insignificant growth. -Monitor temps  AMS (resolved) - likely 2/2 hypoglycemia.  -Likely multifocal in the setting of hypothermia, hypoglycemia -CT head neg -mini mental exam this AM: passed - TSH normal.   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 Cr 2.51 > 1.20. -Avoid nephrotoxic medications -Daily BMETs  Cellulitis: Bilateral lower extremities with signs of cellulitis. Warm to touch with increased erythema.  -doxycyline 100mg  BID x 14 total days -wound care consult > unna boot placed. Needs HHRN to reassess and reapply in 1 week.  CHF: NICM, EF 55-60% Grade II DD(05/2015). NYHA III symptoms. Volume status elevated. Baseline weight appears to be ~280 lbs; on admission weight 291lbs > 287 lbs > 296 lb > weight today pending. UOP 650 ml. Net positive 945 cc this admission. BNP 54. Mild bibasilar crackles on exam. -IV fluid restriction -Daily weights -On Torsemide 80mg  bid and Metolazone 5mg  weekly at home. Will d/c on  Torsemide 40mg  daily. -continue HF meds - coreg, isordil, losartan  DM: Last A1c 06/2015 was 9.5. Repeat A1c 6/2: 9.3%. Received 37 units of Novolog per sliding scale yesterday. CBGs ranging from 222-394 in the last 24 hours. - Hold home metformin and Novolin - Will increase Lantus from 30 units daily to 40 units daily. - On Novolin 70/30 at home- 60 units in the morning, 50 units at lunch, and 12 units in the evening. Will d/c on 35 units in the morning, 30 units in the afternoon, and 12 units in the evening.  HTN  -Stable at 130/59. -continue home BP regimen with coreg and losartan  Hypothyroidism -TSH 1.4 -continue home synthroid 71mcg daily   GAD/Insomnia. -takes klonopin 1mg  tid at home. Restarted at 0.5mg  bid this hospitalization.  FEN/GI: SLIV. Heart healthy carb-modified diet.  PPx: Subq Hep  Disposition: PT recommending HHPT, 24 hour supervision, rolling walker with 5'' wheels, 3 in 1 commode.  Subjective:  Pt doing well this morning. She is ready to go home. No concerns. States that her breathing is at baseline.  Objective: Temp:  [97.5 F (36.4 C)-97.7 F (36.5 C)] 97.7 F (36.5 C) (06/04 0513) Pulse Rate:  [65-77] 65 (06/04 0513) Resp:  [16-19] 18 (06/04 0513) BP: (130-152)/(56-60) 130/59 mmHg (06/04 0513) SpO2:  [96 %-100 %] 96 % (06/04 0513) Physical Exam: General: Obese, laying in bed, in NAD Cardiovascular: RRR, no murmurs, distal pulses intact Respiratory: Normal work of breathing, mild crackles in lung bases bilaterally. Abdomen: distended, no tenderness, no masses, no rebound, no guarding.  Extremities: Lower extremity edema in bilaterally, unna boots in  place bilaterally Neuro: CN 2-12 intact. AAOx3.  Laboratory:  Recent Labs Lab 10/26/15 0509 10/27/15 0908 10/28/15 0435  WBC 6.8 5.1 PENDING  HGB 10.5* 11.0* 10.2*  HCT 33.8* 36.0 32.9*  PLT 238 198 PENDING    Recent Labs Lab 10/25/15 1212 10/26/15 0509 10/27/15 0908 10/28/15 0435  NA  138 136 138 141  K 4.9 4.2 5.4* 4.5  CL 106 102 107 109  CO2 21* 23 24 24   BUN 99* 92* 82* 63*  CREATININE 2.12* 1.71* 1.60* 1.20*  CALCIUM 9.2 8.8* 9.0 9.2  PROT 7.0  --   --   --   BILITOT 0.5  --   --   --   ALKPHOS 104  --   --   --   ALT 27  --   --   --   AST 29  --   --   --   GLUCOSE 211* 234* 351* 177*   BNP  Imaging/Diagnostic Tests: Dg Chest 2 View  10/25/2015 CLINICAL DATA: Altered mental status EXAM: CHEST 2 VIEW COMPARISON: July 09, 2015 FINDINGS: There is no edema or consolidation. Heart is mildly enlarged with pulmonary vascularity within normal limits. No adenopathy. There is an exostosis arising from the anterior left first rib, stable. IMPRESSION: Stable cardiomegaly. No edema or consolidation. Electronically Signed By: Lowella Grip III M.D. On: 10/25/2015 12:58   Ct Head Wo Contrast  10/25/2015 CLINICAL DATA: Patient with altered mental status. EXAM: CT HEAD WITHOUT CONTRAST TECHNIQUE: Contiguous axial images were obtained from the base of the skull through the vertex without intravenous contrast. COMPARISON: Brain CT 10/21/2013. FINDINGS: Ventricles and sulci are appropriate for patient's age. No evidence for acute cortically based infarct, intracranial hemorrhage, mass lesion or mass-effect. Periventricular and subcortical white matter hypodensity compatible with chronic microvascular ischemic changes. Encephalomalacia within the right cerebellar hemisphere compatible with remote infarct. Orbits are unremarkable. Paranasal sinuses are unremarkable. Mastoid air cells unremarkable. Calvarium is intact. IMPRESSION: No acute intracranial process. Chronic microvascular ischemic changes. Electronically Signed By: [name] M.D. On: 10/25/2015 12:48    Sela Hua, MD 10/28/2015, 7:24 AM PGY-1, [place] Intern pager: [phone], text pages welcome

## 2015-10-28 NOTE — Progress Notes (Signed)
Pt discharged to private vehicle in stable condition with husband. 3 in 1 and walker delivered to patient. No further questions

## 2015-10-29 ENCOUNTER — Other Ambulatory Visit: Payer: Self-pay | Admitting: *Deleted

## 2015-10-29 NOTE — Patient Outreach (Signed)
Susan [name]) Care Management [facility] [name] Telephone Outreach, Transition of Care Day 1 10/29/2015  Susan [name] [birth year] [id number]  Susan [name] is an 65 y.o. female previously followed by Susan [name] for for self-management of chronic disease states of CHF and DM.Patient had previous [name] visit IP [name] visit in February 2017, and was readmitted June 1-3, 2017 for AMS, hypothermia, and hypoglycemia.  Today, Susan [name] reports that she "is having a hard time" knowing what she is supposed to," after her recent [name] discharge home; Susan [name] reports that she "has taken most" of her medicines, but has not taken all of them.  Susan [name] states that she has called her PCP office to schedule an appointment, but "can't get one, because my doctor is on vacation."    Susan [name] describes an episode "about 3 weeks ago," where she went to [employer] to shop and "passed out; I woke up to the EMT's reviving me."  Susan [name] states this episode did not result in a [name] admission, and states that she was allowed to drive home "after the EMT's revived" her.  Susan [name] reports that she does not know whether or not she told her providers about this incident, stating, "I think I told Susan." (Susan [name], Heart/ Vascular provider).  Susan [name] then reported that she had a subsequent office visit with Susan [name] for DM follow up.  Susan [name] stated that she was told during the office visit that her "sugar machine is 40 points off."  Susan [name] stated that Susan [name] told her that her current glucose monitor "was obsolete," and that Susan [name] was going to write her a prescription for a new glucose monitor when she "finished using all the test strips" from the old monitor.  Susan [name] reported a glucose reading this morning (with her 'old' monitor) of 168.    Susan [name] acknowledged today that she refused home health services upon [name] discharge, stating that she thought Susan [name] was "taking care of all that."  Susan [name] and I again  discussed Susan [name] vs.home health services, and I strongly reiterated the need for Susan [name] to participate in home health, as she has done in the past, while also receiving Susan [name] [name] services.  I advised Susan [name] to take the rest of her medications for today as prescribed, and modified a previously scheduled Susan [name] [name] in home visit for tomorrow to adjust for new post-discharge transition of care needs/ assessment.  Susan [name] and I had a long discussion about symptoms that would require her to call EMS should she run into problems overnight, including:  Falls, shortness of breath, chest pain, signs/ symptoms of high-or-low blood glucose.  I advised Susan [name] to not drive if she experienced any signs or symptoms of illness, and strongly advised her to call EMS if she had any concerns.  Susan [name] was in agreement to plan discussed.   Plan:  Susan [name] will take all of her medications as prescribed today.  Susan [name] will call EMS for any urgent or emergent issues or concerns she experiences.  Susan [name] will monitor and record her weight this afternoon and again tomorrow morning.  Susan [name] in home visit for transition of care after recent [name] discharge scheduled for tomorrow morning.  Will communicate all [place] [name] findings to care team for care collaboration.  Susan Rack, RN, BSN, [employer] [name] Care Management  [phone]

## 2015-10-30 ENCOUNTER — Encounter: Payer: Self-pay | Admitting: *Deleted

## 2015-10-30 ENCOUNTER — Other Ambulatory Visit: Payer: Self-pay | Admitting: *Deleted

## 2015-10-30 ENCOUNTER — Telehealth: Payer: Self-pay | Admitting: *Deleted

## 2015-10-30 DIAGNOSIS — I10 Essential (primary) hypertension: Secondary | ICD-10-CM

## 2015-10-30 DIAGNOSIS — IMO0001 Reserved for inherently not codable concepts without codable children: Secondary | ICD-10-CM

## 2015-10-30 DIAGNOSIS — E1165 Type 2 diabetes mellitus with hyperglycemia: Secondary | ICD-10-CM

## 2015-10-30 LAB — CULTURE, BLOOD (ROUTINE X 2)
Culture: NO GROWTH
Culture: NO GROWTH

## 2015-10-30 NOTE — Telephone Encounter (Signed)
Orders sent

## 2015-10-30 NOTE — Telephone Encounter (Signed)
Okay to send orders

## 2015-10-30 NOTE — Telephone Encounter (Signed)
Received call from Reginia Naas, [facility] RN.   Reports that patient has been discharged from hospital on 10/27/2015.  Reports that [facility] was supposed to send [facility] orders to [facility], but no orders were sent.   Requesting orders for [facility] SN and PT eval. Ok to order?  Hospital F/U scheduled with PA on 11/05/2015. Patient will be out of town on 6/8- 6/11.

## 2015-10-30 NOTE — Patient Outreach (Signed)
[place] [place] [facility]) Care Management  [place] Home Visit, Transition of Care day 2 10/30/2015  Yuvia Kirouac Feb 03, 1951 [id number]  Macyn Rutz is an 65 y.o. female previously followed by [place] for for self-management of chronic disease states of CHF and DM. Patient had previous hospital visit IP hospital visit in February 2017, and was readmitted June 1-3, 2017 for AMS, hypothermia, and hypoglycemia.  [name] reported that when the EMTs checked her blood sugar when they took her to the hospital, "it was 20."  Yesterday during [place] telephone outreach, [place] described an episode "about 3 weeks ago," where she went to [employer] to shop and "passed out; I woke up to the EMT's reviving me."  [name] states this episode did not result in a hospital admission, and states that she was allowed to drive home "after the EMT's revived" her.  [name] reported that she does not know whether or not she told her providers about this incident.  [name] then reported that she had a subsequent office visit with Dr. [name] for DM follow up on Oct 04, 2015.  [name] could not remember whether the [place] [employer] incident "happened before or after" her OV with Dr. [name].  [name] stated that she was told during that office visit that her "sugar machine is 40 points off."  [name] reported that Dr. [name] told her that her current glucose monitor "was obsolete," and that Dr. [name] was going to write her a prescription for a new glucose monitor when she "finished using all the test strips" from the old monitor.    [name] reported that she had "another episode of low blood sugar early this morning."  Patient recorded blood sugars as viewed by [place] RN CM today:  10/29/15: am:  191             Lunch: [place]: 207  10/30/15: 0400:  65-- took OJ/ peanut butter sandwich with improvement of symptoms/ glucose value             0430:  120             0800: 283             1000: 241         Compared patients glucometer to [place] CM glucometer at 11:10 am: Patient's read "above 600," [name]  Glucometer read "570."  Patient asymptomatic with these readings.  [facility] RN CM called Dr. [name] at [phone] during [facility] home visit to report blood sugars obtained during today's home visit, information provided by patient, make patient a scheduled office visit with Dr. [name], and request a new glucometer; Dr. [name] promptly returned call to [place], and scheduled an office visit with patient on Monday November 12, 2015 at 10:00 am; Dr. [name] confirmed that she would put a new glucometer at the front office desk for patient pick up tomorrow.  Dr. [name] provided a verbal telephone order for patient to discontinue evening dose of Insulin; order verified by read-back to Dr. [name], on speaker phone, with patient listening.  [name] RN CM thoroughly discussed this order/ change in insulin schedule with patient after phone call with Dr. [name] completed, and wrote down the new instructions for patient to refer to at home.  [name] verbalized understanding of the new instructions for insulin administration.  [Dr. Almetta Lovely office staff to fax telephone order obtained today to [facility]  CM for placement in EMR].  [place] then placed call to [facility] at [phone], as patient provided post-hospital discharge instructions indicated that home health was ordered for patient, although discharging MD notes in EMR stated that patient had refused home health services.  "Thayer Headings" at [facility] confirmed that patient did not have current orders for home health services.  [place] then placed call to patients PCP, Dr. Doren Custard at [phone] to obtain post-hospital discharge follow up appointment and home health RN/PT order.  Spoke with "Margreta Journey," nurse at dr. Mee Hives practice, who confirmed limited appointment availability around practice schedule and patient's upcoming trip planned out of town from June 8-11, 2017;  patient had noted prior appointment with heart and vascular center providers previously scheduled for tomorrow (October 31, 2015) so appointment for Monday November 05, 2015 at 12:15 pm was made.  Margreta Journey also confirmed order for home health RN and PT services by practice MD would be requested.  Confirmed same day through EMR that home health order has been placed.  [place] then placed call to [facility] at [phone] to confirm patient's scheduled office visit for tomorrow, October 31, 2015 at 12:00pm.  Office staff a [place] confirmed that patient would be seen by Darrick Grinder, NP during scheduled visit.  Patient's recorded weights as visualized by [facility] RN CM:   Weight 10/29/15:  283.2 lbs Weight 10/30/15: 283.6 lbs  [name] reported today that she was sent home from the hospital with a "new walker" and a 3-in-1 commode to be placed over her existing toilet.  [name] reports that she "loves" the walker, but "can't use the 3-in-1 commode "because it pinches" her skin.  Patient was advised to use the walker 100% of the time until home health PT eval completed, and to move slowly/ not rush, and patient agreed to do so.    Today, [name] reports taking all of her medications as they are prescribed; during home visit, we thoroughly discussed all of [name]'s medications; patient was recently discharged from hospital and all medications have been reviewed.   I further discussed with [place] and [facility] CSW referrals previously made by [facility] RN CM.  [name] stated that she "was confused" about whether or not she had spoken to [name] staff about her previous request for medication assistance, and I updated her on the report I had received.  [name] did remember speaking with [facility] CSW, and I encouraged her to continue communicating with [name] regarding the possibility of installation of permanent grab bars in her shower/ toilet areas for fall prevention and safety, and she agreed to do  so.  Subjective: "I just don't know how to eat to keep my diabetes from being all over the place.... I feel like I am on a roller coaster, it's either way too low, or way too high.  I just know that I feel better when my sugars are on the high side.  I feel awful when they are too low."  Objective:    BP 110/60 mmHg  Pulse 76  Resp 18  Wt 283 lb 9.6 oz (128.64 kg)  SpO2 98%   Review of Systems  Constitutional: Positive for malaise/fatigue. Negative for fever and weight loss.  Respiratory: Positive for cough and shortness of breath. Negative for sputum production and wheezing.   Cardiovascular: Positive for leg swelling. Negative for chest pain.  Gastrointestinal: Negative.  Negative for abdominal pain.  Genitourinary: Positive for urgency and frequency.  Patient wears Depends everyday secondary to diuretic therapy; reports frequency and urgency after she takes medicaiton  Musculoskeletal: Positive for falls. Negative for back pain and joint pain.  Neurological: Positive for weakness. Negative for dizziness.  Psychiatric/Behavioral: Positive for depression. The patient is nervous/anxious.     Physical Exam  Constitutional: She is oriented to person, place, and time. She appears well-developed and well-nourished.  Cardiovascular: Normal rate, regular rhythm, normal heart sounds and intact distal pulses.   Heart sounds slightly distant secondary to obesity  Respiratory: Effort normal and breath sounds normal. No respiratory distress. She has no wheezes. She has no rales.  GI: Soft. Bowel sounds are normal.  Musculoskeletal: She exhibits edema.  Patients bilateral LE are wrapped in "unaboots" done at time of hospital discharge; patient states that she was instructed to keep them wrapped "for one week."  Patient has bilateral LE edema noted, but degree not able to determine from presence of wrapping.  Neurological: She is alert and oriented to person, place, and time.  Skin: Skin is  warm and dry.  Psychiatric: She has a normal mood and affect. Her behavior is normal. Judgment and thought content normal.    Encounter Medications:   Outpatient Encounter Prescriptions as of 10/30/2015  Medication Sig Note  . albuterol (PROVENTIL HFA;VENTOLIN HFA) 108 (90 BASE) MCG/ACT inhaler Inhale 2 puffs into the lungs every 6 (six) hours as needed for wheezing or shortness of breath.   Marland Kitchen aspirin EC 81 MG tablet Take 81 mg by mouth daily.   . carvedilol (COREG) 12.5 MG tablet Take 1.5 tablets (18.75 mg total) by mouth 2 (two) times daily with a meal.   . clonazePAM (KLONOPIN) 1 MG tablet Take 1 tablet (1 mg total) by mouth 3 (three) times daily.   . clopidogrel (PLAVIX) 75 MG tablet TAKE 1 TABLET (75 MG TOTAL) BY MOUTH DAILY WITH BREAKFAST.   Marland Kitchen doxycycline (VIBRA-TABS) 100 MG tablet Take 1 tablet (100 mg total) by mouth every 12 (twelve) hours.   . ferrous sulfate 325 (65 FE) MG tablet Take 325 mg by mouth daily with breakfast. Reported on 08/21/2015 09/27/2015: Taking non prescription form   . insulin NPH-regular Human (NOVOLIN 70/30) (70-30) 100 UNIT/ML injection Inject 12-75 Units into the skin 3 (three) times daily with meals. 35 units in the morning, and 30 with lunch and 12 in the evening   . isosorbide dinitrate (ISORDIL) 10 MG tablet TAKE 1 TABLET (10 MG TOTAL) BY MOUTH 2 (TWO) TIMES DAILY.   Marland Kitchen levothyroxine (SYNTHROID, LEVOTHROID) 75 MCG tablet Take 1 tablet (75 mcg total) by mouth daily before breakfast.   . losartan (COZAAR) 50 MG tablet TAKE 1 TABLET BY MOUTH DAILY   . metFORMIN (GLUMETZA) 500 MG (MOD) 24 hr tablet Take 500 mg by mouth 2 (two) times daily with a meal.   . nitroGLYCERIN (NITROSTAT) 0.4 MG SL tablet Place 1 tablet (0.4 mg total) under the tongue every 5 (five) minutes as needed for chest pain. 09/27/2015: Takes as needed, has not needed recently   . potassium chloride SA (K-DUR,KLOR-CON) 20 MEQ tablet Take 20 mEq by mouth 2 (two) times daily.   . pregabalin (LYRICA) 150  MG capsule Take 150 mg by mouth 3 (three) times daily. Reported on 09/27/2015 09/27/2015: Pt. Reports not taking due to cost of medication under her new insurance plan  . torsemide (DEMADEX) 20 MG tablet Take 4 tablets (80 mg total) by mouth daily.    No facility-administered encounter medications on file  as of 10/30/2015.    Functional Status:   In your present state of health, do you have any difficulty performing the following activities: 10/25/2015 08/09/2015  Hearing? N Y  Vision? N N  Difficulty concentrating or making decisions? N Y  Walking or climbing stairs? Y Y  Dressing or bathing? Y N  Doing errands, shopping? N N  Preparing Food and eating ? - N  Using the Toilet? - N  In the past six months, have you accidently leaked urine? - N  Do you have problems with loss of bowel control? - N  Managing your Medications? - N  Managing your Finances? - N  Housekeeping or managing your Housekeeping? - N    Fall/Depression Screening:    PHQ 2/9 Scores 10/19/2015 08/09/2015 08/07/2015 03/16/2015  PHQ - 2 Score 4 4 4  0  PHQ- 9 Score 16 16 16  0    Assessment:  [name]'s blood sugars have become significantly labile, requiring her to seek emergency care on two recent occasions, one resulting in an IP hospital visit.  [name] is very frustrated with her fluctuating glucose levels and feels that she has not control over her blood sugars.  [name] remains somewhat confused about keeping her providers informed when she experiences urgent/ emergent problems.    Plan:  [name] will take all of her medications as they are prescribed and will keep all scheduled provider appointments.  [name] will call EMS for any urgent or emergent issues or concerns she experiences.  [name] will contact her care/ medical providers for any questions, concerns, issues, or problems that she experiences prior to scheduled office visits.  [name] will continue to monitor and record her weight daily and will take these values to her  providers appointments with her.  [name] will continue using her walker and will work with home health nursing and PT services which were ordered today by her PCP.  [name] will pick up her new glucometer from her endocrinologist provider, Dr. Almetta Lovely office, tomorrow.  [place] outreach for transition of care after recent hospital discharge to continue with telephone outreach next week.   Will communicate all [place] CM findings to care team for care collaboration.   [name], RN, BSN, [employer] [facility] Care Management  [phone]

## 2015-10-31 ENCOUNTER — Ambulatory Visit (HOSPITAL_COMMUNITY)
Admission: RE | Admit: 2015-10-31 | Discharge: 2015-10-31 | Disposition: A | Discharge status: Home / Self Care | Payer: PPO | Source: Ambulatory Visit | Attending: Cardiology | Admitting: Cardiology

## 2015-10-31 VITALS — BP 106/64 | HR 84 | Wt 289.0 lb

## 2015-10-31 DIAGNOSIS — I2510 Atherosclerotic heart disease of native coronary artery without angina pectoris: Secondary | ICD-10-CM | POA: Insufficient documentation

## 2015-10-31 DIAGNOSIS — Z7982 Long term (current) use of aspirin: Secondary | ICD-10-CM | POA: Diagnosis not present

## 2015-10-31 DIAGNOSIS — E0865 Diabetes mellitus due to underlying condition with hyperglycemia: Secondary | ICD-10-CM

## 2015-10-31 DIAGNOSIS — E1165 Type 2 diabetes mellitus with hyperglycemia: Secondary | ICD-10-CM | POA: Diagnosis not present

## 2015-10-31 DIAGNOSIS — R0600 Dyspnea, unspecified: Secondary | ICD-10-CM | POA: Diagnosis not present

## 2015-10-31 DIAGNOSIS — N183 Chronic kidney disease, stage 3 (moderate): Secondary | ICD-10-CM | POA: Diagnosis not present

## 2015-10-31 DIAGNOSIS — E0821 Diabetes mellitus due to underlying condition with diabetic nephropathy: Secondary | ICD-10-CM | POA: Diagnosis not present

## 2015-10-31 DIAGNOSIS — Z87891 Personal history of nicotine dependence: Secondary | ICD-10-CM | POA: Diagnosis not present

## 2015-10-31 DIAGNOSIS — E785 Hyperlipidemia, unspecified: Secondary | ICD-10-CM | POA: Diagnosis not present

## 2015-10-31 DIAGNOSIS — I130 Hypertensive heart and chronic kidney disease with heart failure and stage 1 through stage 4 chronic kidney disease, or unspecified chronic kidney disease: Secondary | ICD-10-CM | POA: Diagnosis not present

## 2015-10-31 DIAGNOSIS — R0602 Shortness of breath: Secondary | ICD-10-CM | POA: Diagnosis not present

## 2015-10-31 DIAGNOSIS — Z79899 Other long term (current) drug therapy: Secondary | ICD-10-CM | POA: Insufficient documentation

## 2015-10-31 DIAGNOSIS — I252 Old myocardial infarction: Secondary | ICD-10-CM | POA: Diagnosis not present

## 2015-10-31 DIAGNOSIS — Z8673 Personal history of transient ischemic attack (TIA), and cerebral infarction without residual deficits: Secondary | ICD-10-CM | POA: Diagnosis not present

## 2015-10-31 DIAGNOSIS — E039 Hypothyroidism, unspecified: Secondary | ICD-10-CM | POA: Diagnosis not present

## 2015-10-31 DIAGNOSIS — I5032 Chronic diastolic (congestive) heart failure: Secondary | ICD-10-CM | POA: Diagnosis not present

## 2015-10-31 DIAGNOSIS — N182 Chronic kidney disease, stage 2 (mild): Secondary | ICD-10-CM

## 2015-10-31 DIAGNOSIS — IMO0002 Reserved for concepts with insufficient information to code with codable children: Secondary | ICD-10-CM

## 2015-10-31 DIAGNOSIS — M1990 Unspecified osteoarthritis, unspecified site: Secondary | ICD-10-CM | POA: Insufficient documentation

## 2015-10-31 DIAGNOSIS — E1142 Type 2 diabetes mellitus with diabetic polyneuropathy: Secondary | ICD-10-CM | POA: Diagnosis not present

## 2015-10-31 DIAGNOSIS — E669 Obesity, unspecified: Secondary | ICD-10-CM | POA: Insufficient documentation

## 2015-10-31 DIAGNOSIS — Z794 Long term (current) use of insulin: Secondary | ICD-10-CM | POA: Diagnosis not present

## 2015-10-31 DIAGNOSIS — I872 Venous insufficiency (chronic) (peripheral): Secondary | ICD-10-CM | POA: Diagnosis not present

## 2015-10-31 DIAGNOSIS — E1122 Type 2 diabetes mellitus with diabetic chronic kidney disease: Secondary | ICD-10-CM | POA: Insufficient documentation

## 2015-10-31 DIAGNOSIS — I10 Essential (primary) hypertension: Secondary | ICD-10-CM

## 2015-10-31 NOTE — Progress Notes (Signed)
Advanced Heart Failure Medication Review by a Pharmacist  Does the patient  feel that his/her medications are working for him/her?  yes  Has the patient been experiencing any side effects to the medications prescribed?  no  Does the patient measure his/her own blood pressure or blood glucose at home?  no   Does the patient have any problems obtaining medications due to transportation or finances?   Patient states that her pregabalin is too expensive for her to afford.   Understanding of regimen: fair Understanding of indications: fair Potential of compliance: fair Patient understands to avoid NSAIDs. Patient understands to avoid decongestants.   Pharmacist comments: Susan [name] is a pleasant 39 yof presenting to the clinic for a follow-up. She states that she is "not feeling herself lately". She does not endorse any other issues at this time. She mentioned that her lyrica prescription has been too expensive for her to afford and asked if we could look into additional copay coverage via the manufacturer. She was given the manufacturers paperwork to fill out and send in to enroll for copay assistance.   Patient was recently discharged from hospital and all medications have been reviewed.   Time with patient: 10 min  Preparation and documentation time: 2 min  Total time: 12 min    Susan [name] C. Lennox Grumbles, PharmD Pharmacy Resident  Pager: [phone] 10/31/2015 12:00 PM

## 2015-10-31 NOTE — Progress Notes (Signed)
Patient ID: Neal Romanchuk, female   DOB: 1950/11/29, 65 y.o.   MRN: [id number] Patient ID: Alyana Kempter, female   DOB: 27-Sep-1950, 65 y.o.   MRN: [id number]   PCP: Dr Edilia Bo  Cardiologist: Dr Myrtis Ser  Endocrinologist: Dr Lurene Shadow  Primary HF Cardiologist: Dr Leory Plowman   HPI: Mrs Feider is a 65 year old with a history of RBBB, DM, Hypothyroid, diastolic heart failure, CAD MI 2000/2007 BMS 2007, TIA, obesity.   Admitted 06/07/15 with N,V, hyperglycemia. Creatinine was up to 1.9. On the day discharge creatinine was back down to 1.2 Thought to be dehydrated so diuretics were held. Diuretics restarted on the day discharge. Discharge weight 282 pounds.   Admitted 2/13 through 07/13/15 with gastroenteritis and hyper glycemia. Diuretic initially held due to dehydration. Losartan and metolazone stopped with AKI/CKD. Discharge weight was 283 pounds.   Today she returns for HF follow up. Admitted June 1st through June 3rd with altered mental status, hypothermia, and weakness. Diuretics initially held and later restarted. Unfortunately when she was discharged she refused home health. Today she is complaining of fatigue. Mild dyspnea with exertion. Denies PND/Orthopnea.  Weight at home 285-291 pounds. Today she received a new glucometer from PCP. Has not had syncope. Taking all medications.  Drinking > 2 liters of fluid.   ECHO 07/11/2015- 50-55%.  Grade I DD  RHC 09/22/13: RA = 4  RV = 30/5/7  PA = 25/10 (16)  PCW = 7  Fick cardiac output/index = 6.3/2.7  PVR = 1.5 WU   FA sat = 93%  PA sat = 61%, 66%   Labs: 07/18/13 K 4.8 Creatinine 1.38 Glucose 1005 she refused to go to [facility] Labs : 08/17/13 K 3.9 Creatinine 1.4 Glucose 499 Labs: 09/29/13 K 4.5 Creatinine 1.4  Labs 04/18/14 K 4.4 Creatinine 1.18 Labs 03/26/2015: K 5.3 Creatinine 1.41  Labs 05/01/2015: K 4.9 Creatinine 1.8 Labs 05/04/2015: K 5.0 Creatinine 1.74  Labs 06/10/2015: K 4.7 Creatinine 1.21 Labs 06/12/2015: K 6.3 Creatinine 2.01    Labs 06/19/2015: K 4.5 Creatinine 1.29  Labs 07/10/2015: Hgb A1C 9.5  Labs 07/12/2015: K 4.4 Creatinine 1.62 K 4.4  Labs 08/10/2015: K 5.3 Creatinine 1.44  Labs 10/03/2015: K 4.5 Creatinine 1.41  Labs 10/19/15: K 4.7 creatinine 1.75  Labs 10/28/2015: K 4.5 Creatinine 1.2    SH: Former smoker quit 15 year ago. Does drink alcohol. She is disabled. Lives at home with her husband and disabled son - Husband is truck Hospital doctor. Son has a brain injury after he attempted suicide 2 years ago.    FH: Mom MI  ROS: All systems negative except as listed in HPI, PMH and Problem List.  Past Medical History  Diagnosis Date  . Tachycardia     Sinus tachycardia  . Dyslipidemia   . Hypothyroidism   . CAD (coronary artery disease)     MI in 2000 - MI  2007 - treated bare metal stent (no nuclear since then as 9/11)  . HTN (hypertension)   . Chronic diastolic heart failure (HCC)     a) ECHO (08/2013) EF 55-60% and RV function nl b) RHC (08/2013) RA 4, RV 30/5/7, PA 25/10 (16), PCWP 7, Fick CO/CI 6.3/2.7, PVR 1.5 WU, PA 61 and 66%  . RBBB (right bundle branch block)     Old  . Urinary incontinence   . Obesity   . Syncope     likely due to low blood sugar  . Carotid artery disease (HCC)   . Acute  MI [facility]) 1999; 2007  . CHF (congestive heart failure) (HCC)   . Anxiety   . Depression   . Type II diabetes mellitus (HCC)   . Peripheral neuropathy (HCC)   . Venous insufficiency   . Stroke [facility])     mini strokes  . PONV (postoperative nausea and vomiting)   . Renal insufficiency     DENIES  . Anemia     hx  . Anginal pain (HCC)   . Exertional shortness of breath   . Daily headache     "~ every other day; since I fell in June" (03/15/2014)  . Osteoarthritis   . Arthritis     "generalized" (03/15/2014)    Current Outpatient Prescriptions  Medication Sig Dispense Refill  . albuterol (PROVENTIL HFA;VENTOLIN HFA) 108 (90 BASE) MCG/ACT inhaler Inhale 2 puffs into the lungs every 6 (six) hours as needed for  wheezing or shortness of breath. Reported on 10/30/2015    . aspirin EC 81 MG tablet Take 81 mg by mouth daily.    . carvedilol (COREG) 12.5 MG tablet Take 1.5 tablets (18.75 mg total) by mouth 2 (two) times daily with a meal. 90 tablet 6  . clonazePAM (KLONOPIN) 1 MG tablet Take 1 tablet (1 mg total) by mouth 3 (three) times daily. 90 tablet 2  . clopidogrel (PLAVIX) 75 MG tablet TAKE 1 TABLET (75 MG TOTAL) BY MOUTH DAILY WITH BREAKFAST. 90 tablet 2  . doxycycline (VIBRA-TABS) 100 MG tablet Take 1 tablet (100 mg total) by mouth every 12 (twelve) hours. 20 tablet 0  . ferrous sulfate 325 (65 FE) MG tablet Take 325 mg by mouth daily with breakfast. Reported on 08/21/2015    . insulin NPH-regular Human (NOVOLIN 70/30) (70-30) 100 UNIT/ML injection Inject 12-75 Units into the skin 3 (three) times daily with meals. 35 units in the morning, and 30 with lunch and 12 in the evening 10 mL 11  . isosorbide dinitrate (ISORDIL) 10 MG tablet TAKE 1 TABLET (10 MG TOTAL) BY MOUTH 2 (TWO) TIMES DAILY. 60 tablet 10  . levothyroxine (SYNTHROID, LEVOTHROID) 75 MCG tablet Take 1 tablet (75 mcg total) by mouth daily before breakfast. 30 tablet 3  . losartan (COZAAR) 50 MG tablet TAKE 1 TABLET BY MOUTH DAILY 90 tablet 1  . metFORMIN (GLUMETZA) 500 MG (MOD) 24 hr tablet Take 500 mg by mouth 2 (two) times daily with a meal.    . nitroGLYCERIN (NITROSTAT) 0.4 MG SL tablet Place 1 tablet (0.4 mg total) under the tongue every 5 (five) minutes as needed for chest pain. 25 tablet 3  . potassium chloride SA (K-DUR,KLOR-CON) 20 MEQ tablet Take 20 mEq by mouth 2 (two) times daily.    Marland Kitchen torsemide (DEMADEX) 20 MG tablet Take 4 tablets (80 mg total) by mouth daily. 240 tablet 2  . pregabalin (LYRICA) 150 MG capsule Take 150 mg by mouth 3 (three) times daily. Reported on 10/31/2015     No current facility-administered medications for this encounter.    Filed Vitals:   10/31/15 1142  BP: 106/64  Pulse: 84  Weight: 289 lb (131.09 kg)   SpO2: 98%   PHYSICAL EXAM: General:  Chronically ill appearing. Arrived in a wheel chair.  Son present      HEENT: normal Neck: supple. JVP difficult to assess d/t body habitus. Appear elevated.     Carotids 2+ bilaterally; no bruits. No lymphadenopathy or thryomegaly appreciated. Cor: PMI normal. Regular rate & rhythm. No rubs, gallops or murmurs.  Lungs: decreased in the bases.  Abdomen: obese, soft, nontender, distended. No hepatosplenomegaly. No bruits or masses. Good bowel sounds. Extremities: no cyanosis, clubbing, rash, R and LLE with compression wraps.  Neuro: alert & orientedx3, cranial nerves grossly intact. Moves all 4 extremities w/o difficulty. Affect pleasant.     ASSESSMENT & PLAN: 1. Chronic Diastolic Heart Failure: NICM, EF 55-60% Grade II DD(06/2015 )  -- Post Hospital visit she is doing ok but at high risk for readmit due to poor insight.  NYHA III symptoms. Volume status ok.  Continue torsemide 80 mg daily + 20 meq potassium twice a day.    -Continue losartan 50 mg daily.    - Reinforced the need and importance of daily weights, a low sodium diet, and fluid restriction (less than 2 L a day). Instructed to call the HF clinic if weight increases more than 3 lbs overnight or 5 lbs in a week 2.. Dyspnea- She has been offered sleep study many times but refuses.  Continue inhalers as needed.   3. Uncontrolled DM:  Has follow up with PCP. Dr Lurene Shadow managing. Received new glucometer from Dr Lurene Shadow today,   4. CAD- no evidence of ischemia- on aspirin and statin.  5. Obesity: Discussed portion control the importance of weight loss.  6. HTN-  Stable. Continue current regimen.     7. CKD Stage III- BMET reviewed and stable from 10/30/2015. Referred to Nephrology next week.    Follow up next week to reassess volume status. She is going to need weekly visits for the nest few weeks. [name] will also follow closely. Refused HH. Will need PCP to change unna boots weekly.     Stefan Karen  NP-C  12:08 PM

## 2015-10-31 NOTE — Patient Instructions (Signed)
RECHECK IN ONE WEEK WITH [name], NP

## 2015-11-05 ENCOUNTER — Ambulatory Visit (INDEPENDENT_AMBULATORY_CARE_PROVIDER_SITE_OTHER): Payer: PPO | Admitting: Physician Assistant

## 2015-11-05 VITALS — BP 154/82 | HR 96 | Temp 97.7°F | Resp 20 | Wt 297.0 lb

## 2015-11-05 DIAGNOSIS — Z91199 Patient's noncompliance with other medical treatment and regimen due to unspecified reason: Secondary | ICD-10-CM

## 2015-11-05 DIAGNOSIS — F32A Depression, unspecified: Secondary | ICD-10-CM

## 2015-11-05 DIAGNOSIS — F329 Major depressive disorder, single episode, unspecified: Secondary | ICD-10-CM | POA: Diagnosis not present

## 2015-11-05 DIAGNOSIS — Z09 Encounter for follow-up examination after completed treatment for conditions other than malignant neoplasm: Secondary | ICD-10-CM

## 2015-11-05 DIAGNOSIS — Z9119 Patient's noncompliance with other medical treatment and regimen: Secondary | ICD-10-CM

## 2015-11-05 DIAGNOSIS — F418 Other specified anxiety disorders: Secondary | ICD-10-CM

## 2015-11-05 DIAGNOSIS — I5032 Chronic diastolic (congestive) heart failure: Secondary | ICD-10-CM | POA: Diagnosis not present

## 2015-11-05 DIAGNOSIS — F419 Anxiety disorder, unspecified: Secondary | ICD-10-CM

## 2015-11-05 MED ORDER — PAROXETINE HCL 20 MG PO TABS: 20.0000 mg | ORAL_TABLET | Freq: Every day | ORAL | Status: DC

## 2015-11-05 NOTE — Progress Notes (Signed)
Patient ID: Susan [name] MRN: [id number], DOB: Feb 25, 1951, 65 y.o. Date of Encounter: @DATE @  Chief Complaint:  Chief Complaint  Patient presents with  . Hosp follow up    unsure of meds, wants to discuss restart Paxil    HPI: 65 y.o. year old female  presents with above.   I reviewed her Hospital Discharge Summary. Hospitalized 10/25/2015 - 10/27/2015.   She had phone conversation with Cardiology office---she had slurred speech and altered mental status. EMS was called. Upon arrival to ED, she was hypothermic at 23F.  Upon further discussion, she had been hypoglycemic to 20 at home that morning.  BUN was elevated at 98.  Wound care was consulted for her lower legs.  She refused home services including HHN, PT, OT etc.   She has had f/u OV at CHF clinic on 10/31/2015. I reviewed their OV note today also.   She says she is to f/u there once a week. She has appt there 10/25/2015 at 10:00 am.   Today she reports that her lower legs hurt and fell tight.  Asked if she is weighing herself at home---she is not. She has not weighed herself at all over the past week.  Says she is taking 4 of the torsemide daily.  Has not been taking any Zaroxolyn.   Says she needs to get back on Paxil. Says she cries easily and feels that she needs to get back on Paxil.   No other complaints/concerns.   Past Medical History  Diagnosis Date  . Tachycardia     Sinus tachycardia  . Dyslipidemia   . Hypothyroidism   . CAD (coronary artery disease)     MI in 2000 - MI  2007 - treated bare metal stent (no nuclear since then as 9/11)  . HTN (hypertension)   . Chronic diastolic heart failure (HCC)     a) ECHO (08/2013) EF 55-60% and RV function nl b) RHC (08/2013) RA 4, RV 30/5/7, PA 25/10 (16), PCWP 7, Fick CO/CI 6.3/2.7, PVR 1.5 WU, PA 61 and 66%  . RBBB (right bundle branch block)     Old  . Urinary incontinence   . Obesity   . Syncope     likely due to low blood sugar  . Carotid artery disease ([place])    . Acute MI ([place]) 1999; 2007  . CHF (congestive heart failure) ([place])   . Anxiety   . Depression   . Type II diabetes mellitus ([place])   . Peripheral neuropathy ([place])   . Venous insufficiency   . Stroke [facility])     mini strokes  . PONV (postoperative nausea and vomiting)   . Renal insufficiency     DENIES  . Anemia     hx  . Anginal pain ([place])   . Exertional shortness of breath   . Daily headache     "~ every other day; since I fell in June" (03/15/2014)  . Osteoarthritis   . Arthritis     "generalized" (03/15/2014)     Home Meds: Outpatient Prescriptions Prior to Visit  Medication Sig Dispense Refill  . albuterol (PROVENTIL HFA;VENTOLIN HFA) 108 (90 BASE) MCG/ACT inhaler Inhale 2 puffs into the lungs every 6 (six) hours as needed for wheezing or shortness of breath. Reported on 10/30/2015    . aspirin EC 81 MG tablet Take 81 mg by mouth daily.    . carvedilol (COREG) 12.5 MG tablet Take 1.5 tablets (18.75 mg total) by mouth 2 (two) times  daily with a meal. 90 tablet 6  . clonazePAM (KLONOPIN) 1 MG tablet Take 1 tablet (1 mg total) by mouth 3 (three) times daily. 90 tablet 2  . clopidogrel (PLAVIX) 75 MG tablet TAKE 1 TABLET (75 MG TOTAL) BY MOUTH DAILY WITH BREAKFAST. 90 tablet 2  . doxycycline (VIBRA-TABS) 100 MG tablet Take 1 tablet (100 mg total) by mouth every 12 (twelve) hours. 20 tablet 0  . ferrous sulfate 325 (65 FE) MG tablet Take 325 mg by mouth daily with breakfast. Reported on 08/21/2015    . isosorbide dinitrate (ISORDIL) 10 MG tablet TAKE 1 TABLET (10 MG TOTAL) BY MOUTH 2 (TWO) TIMES DAILY. 60 tablet 10  . levothyroxine (SYNTHROID, LEVOTHROID) 75 MCG tablet Take 1 tablet (75 mcg total) by mouth daily before breakfast. 30 tablet 3  . losartan (COZAAR) 50 MG tablet TAKE 1 TABLET BY MOUTH DAILY 90 tablet 1  . metFORMIN (GLUMETZA) 500 MG (MOD) 24 hr tablet Take 500 mg by mouth 2 (two) times daily with a meal.    . nitroGLYCERIN (NITROSTAT) 0.4 MG SL tablet Place 1 tablet (0.4  mg total) under the tongue every 5 (five) minutes as needed for chest pain. 25 tablet 3  . potassium chloride SA (K-DUR,KLOR-CON) 20 MEQ tablet Take 20 mEq by mouth 2 (two) times daily.    . pregabalin (LYRICA) 150 MG capsule Take 150 mg by mouth 3 (three) times daily. Reported on 10/31/2015    . torsemide (DEMADEX) 20 MG tablet Take 4 tablets (80 mg total) by mouth daily. 240 tablet 2  . insulin NPH-regular Human (NOVOLIN 70/30) (70-30) 100 UNIT/ML injection Inject 12-75 Units into the skin 3 (three) times daily with meals. 35 units in the morning, and 30 with lunch and 12 in the evening 10 mL 11   No facility-administered medications prior to visit.    Allergies:  Allergies  Allergen Reactions  . Codeine Nausea And Vomiting    Social History   Social History  . Marital Status: Married    Spouse Name: N/A  . Number of Children: 3  . Years of Education: 12th   Occupational History  .      unemploye   Social History Main Topics  . Smoking status: Former Smoker -- 3.00 packs/day for 32 years    Types: Cigarettes    Quit date: 10/24/1997  . Smokeless tobacco: Never Used  . Alcohol Use: Yes     Comment: "might have 2-3 daiquiris in the summer"  . Drug Use: No  . Sexual Activity: No   Other Topics Concern  . Not on file   Social History Narrative   Pt lives at home with her spouse.   Caffeine Use- 3 sodas daily    Family History  Problem Relation Age of Onset  . Coronary artery disease    . Heart attack Mother      Review of Systems:  See HPI for pertinent ROS. All other ROS negative.    Physical Exam: Blood pressure 154/82, pulse 96, temperature 97.7 F (36.5 C), temperature source Oral, resp. rate 20, weight 297 lb (134.718 kg), SpO2 98 %., Body mass index is 47.96 kg/(m^2). General: Obese WF. Appears in no acute distress. Neck: Supple. No thyromegaly. No lymphadenopathy. Lungs: Clear bilaterally to auscultation without wheezes, rales, or rhonchi. Breathing is  unlabored. Heart: RRR with S1 S2. No murmurs, rubs, or gallops. Musculoskeletal:  Strength and tone normal for age. Extremities/Skin: 2+ pitting edema  Neuro: Alert and oriented  X 3. Moves all extremities spontaneously. Gait is normal. CNII-XII grossly in tact. Psych:  Responds to questions appropriately with a normal affect.     ASSESSMENT AND PLAN:  65 y.o. year old female with    Hospital discharge follow-up  Non compliance with medical treatment  Chronic diastolic CHF (congestive heart failure) ([place]) She is to go home and take a Zaroxolyn 5mg  now.  Cont Torsemide 80 mg daily.  Reminded her to f/u with CHF Clinic appt 6/14 at 10:00 am.   Anxiety and depression Will re-start Paxil. Reminded her it will take weeks for this to take effect. Also, discussed that we have to start with low dose and gradually increase dose.  F/U OV 6 weeks, sooner if needed.  - PARoxetine (PAXIL) 20 MG tablet; Take 1 tablet (20 mg total) by mouth daily.  Dispense: 30 tablet; Refill: 1   Signed, [address] [place], [place], [facility] [address] 11/05/2015 1:50 PM

## 2015-11-05 NOTE — Addendum Note (Signed)
Addended by: Deanne Coffer E on: 11/05/2015 02:53 PM   Modules accepted: Orders, Medications

## 2015-11-06 DIAGNOSIS — E113293 Type 2 diabetes mellitus with mild nonproliferative diabetic retinopathy without macular edema, bilateral: Secondary | ICD-10-CM | POA: Diagnosis not present

## 2015-11-06 DIAGNOSIS — H5203 Hypermetropia, bilateral: Secondary | ICD-10-CM | POA: Diagnosis not present

## 2015-11-06 DIAGNOSIS — H52223 Regular astigmatism, bilateral: Secondary | ICD-10-CM | POA: Diagnosis not present

## 2015-11-07 ENCOUNTER — Ambulatory Visit (HOSPITAL_COMMUNITY)
Admission: RE | Admit: 2015-11-07 | Discharge: 2015-11-07 | Disposition: A | Discharge status: Home / Self Care | Payer: PPO | Source: Ambulatory Visit | Attending: Internal Medicine | Admitting: Internal Medicine

## 2015-11-07 VITALS — BP 124/66 | HR 90 | Wt 288.6 lb

## 2015-11-07 DIAGNOSIS — Z8249 Family history of ischemic heart disease and other diseases of the circulatory system: Secondary | ICD-10-CM | POA: Diagnosis not present

## 2015-11-07 DIAGNOSIS — Z87891 Personal history of nicotine dependence: Secondary | ICD-10-CM | POA: Insufficient documentation

## 2015-11-07 DIAGNOSIS — Z79899 Other long term (current) drug therapy: Secondary | ICD-10-CM | POA: Insufficient documentation

## 2015-11-07 DIAGNOSIS — E669 Obesity, unspecified: Secondary | ICD-10-CM | POA: Insufficient documentation

## 2015-11-07 DIAGNOSIS — I5033 Acute on chronic diastolic (congestive) heart failure: Secondary | ICD-10-CM

## 2015-11-07 DIAGNOSIS — Z8673 Personal history of transient ischemic attack (TIA), and cerebral infarction without residual deficits: Secondary | ICD-10-CM | POA: Insufficient documentation

## 2015-11-07 DIAGNOSIS — I130 Hypertensive heart and chronic kidney disease with heart failure and stage 1 through stage 4 chronic kidney disease, or unspecified chronic kidney disease: Secondary | ICD-10-CM | POA: Insufficient documentation

## 2015-11-07 DIAGNOSIS — R110 Nausea: Secondary | ICD-10-CM | POA: Diagnosis not present

## 2015-11-07 DIAGNOSIS — Z955 Presence of coronary angioplasty implant and graft: Secondary | ICD-10-CM | POA: Insufficient documentation

## 2015-11-07 DIAGNOSIS — R0602 Shortness of breath: Secondary | ICD-10-CM

## 2015-11-07 DIAGNOSIS — E1165 Type 2 diabetes mellitus with hyperglycemia: Secondary | ICD-10-CM | POA: Diagnosis not present

## 2015-11-07 DIAGNOSIS — I252 Old myocardial infarction: Secondary | ICD-10-CM | POA: Diagnosis not present

## 2015-11-07 DIAGNOSIS — Z794 Long term (current) use of insulin: Secondary | ICD-10-CM | POA: Diagnosis not present

## 2015-11-07 DIAGNOSIS — R0600 Dyspnea, unspecified: Secondary | ICD-10-CM | POA: Diagnosis not present

## 2015-11-07 DIAGNOSIS — IMO0002 Reserved for concepts with insufficient information to code with codable children: Secondary | ICD-10-CM

## 2015-11-07 DIAGNOSIS — E0821 Diabetes mellitus due to underlying condition with diabetic nephropathy: Secondary | ICD-10-CM

## 2015-11-07 DIAGNOSIS — E039 Hypothyroidism, unspecified: Secondary | ICD-10-CM | POA: Diagnosis not present

## 2015-11-07 DIAGNOSIS — N183 Chronic kidney disease, stage 3 unspecified: Secondary | ICD-10-CM

## 2015-11-07 DIAGNOSIS — E1122 Type 2 diabetes mellitus with diabetic chronic kidney disease: Secondary | ICD-10-CM | POA: Insufficient documentation

## 2015-11-07 DIAGNOSIS — Z7902 Long term (current) use of antithrombotics/antiplatelets: Secondary | ICD-10-CM | POA: Insufficient documentation

## 2015-11-07 DIAGNOSIS — M1990 Unspecified osteoarthritis, unspecified site: Secondary | ICD-10-CM | POA: Insufficient documentation

## 2015-11-07 DIAGNOSIS — I872 Venous insufficiency (chronic) (peripheral): Secondary | ICD-10-CM | POA: Diagnosis not present

## 2015-11-07 DIAGNOSIS — Z7982 Long term (current) use of aspirin: Secondary | ICD-10-CM | POA: Insufficient documentation

## 2015-11-07 DIAGNOSIS — E0865 Diabetes mellitus due to underlying condition with hyperglycemia: Secondary | ICD-10-CM

## 2015-11-07 DIAGNOSIS — I2510 Atherosclerotic heart disease of native coronary artery without angina pectoris: Secondary | ICD-10-CM | POA: Insufficient documentation

## 2015-11-07 DIAGNOSIS — I5032 Chronic diastolic (congestive) heart failure: Secondary | ICD-10-CM

## 2015-11-07 DIAGNOSIS — E785 Hyperlipidemia, unspecified: Secondary | ICD-10-CM | POA: Diagnosis not present

## 2015-11-07 DIAGNOSIS — E1142 Type 2 diabetes mellitus with diabetic polyneuropathy: Secondary | ICD-10-CM | POA: Diagnosis not present

## 2015-11-07 NOTE — Progress Notes (Signed)
Patient ID: Susan [name], female   DOB: 03/07/1951, 65 y.o.   MRN: [id number]   PCP: Dr Edilia Bo  Cardiologist: Dr Myrtis Ser  Endocrinologist: Dr Lurene Shadow  Primary HF Cardiologist: Dr Leory Plowman   HPI: Mrs Puhl is a 65 year old with a history of RBBB, DM, Hypothyroid, diastolic heart failure, CAD MI 2000/2007 BMS 2007, TIA, obesity.   Admitted 06/07/15 with N,V, hyperglycemia. Creatinine was up to 1.9. On the day discharge creatinine was back down to 1.2 Thought to be dehydrated so diuretics were held. Diuretics restarted on the day discharge. Discharge weight 282 pounds.   Admitted 2/13 through 07/13/15 with gastroenteritis and hyper glycemia. Diuretic initially held due to dehydration. Losartan and metolazone stopped with AKI/CKD. Discharge weight was 283 pounds.   Admitted June 1st through June 3rd with altered mental status, hypothermia, and weakness. Diuretics initially held and later restarted. Unfortunately when she was discharged she refused home health.  Today she returns for HF follow up. Since last visit she saw PCP Monday. Started on paxil 11/05/2015. Overall feeling just ok. Says he breathing a little better. She remains SOB with exertion. Complaining of nausea.  Denies PND/Orthopnea.  Weight at home 288-291 pounds. Taking all medications.  Drinking > 2 liters of fluid. Has follow up with Dr Lurene Shadow Monday.   ECHO 07/11/2015- 50-55%.  Grade I DD  RHC 09/22/13: RA = 4  RV = 30/5/7  PA = 25/10 (16)  PCW = 7  Fick cardiac output/index = 6.3/2.7  PVR = 1.5 WU   FA sat = 93%  PA sat = 61%, 66%   Labs: 07/18/13 K 4.8 Creatinine 1.38 Glucose 1005 she refused to go to [facility] Labs : 08/17/13 K 3.9 Creatinine 1.4 Glucose 499 Labs: 09/29/13 K 4.5 Creatinine 1.4  Labs 04/18/14 K 4.4 Creatinine 1.18 Labs 03/26/2015: K 5.3 Creatinine 1.41  Labs 05/01/2015: K 4.9 Creatinine 1.8 Labs 05/04/2015: K 5.0 Creatinine 1.74  Labs 06/10/2015: K 4.7 Creatinine 1.21 Labs 06/12/2015: K 6.3 Creatinine  2.01  Labs 06/19/2015: K 4.5 Creatinine 1.29  Labs 07/10/2015: Hgb A1C 9.5  Labs 07/12/2015: K 4.4 Creatinine 1.62 K 4.4  Labs 08/10/2015: K 5.3 Creatinine 1.44  Labs 10/03/2015: K 4.5 Creatinine 1.41  Labs 10/19/15: K 4.7 creatinine 1.75  Labs 10/28/2015: K 4.5 Creatinine 1.2    SH: Former smoker quit 15 year ago. Does drink alcohol. She is disabled. Lives at home with her husband and disabled son - Husband is truck Hospital doctor. Son has a brain injury after he attempted suicide 2 years ago.    FH: Mom MI  ROS: All systems negative except as listed in HPI, PMH and Problem List.  Past Medical History  Diagnosis Date  . Tachycardia     Sinus tachycardia  . Dyslipidemia   . Hypothyroidism   . CAD (coronary artery disease)     MI in 2000 - MI  2007 - treated bare metal stent (no nuclear since then as 9/11)  . HTN (hypertension)   . Chronic diastolic heart failure (HCC)     a) ECHO (08/2013) EF 55-60% and RV function nl b) RHC (08/2013) RA 4, RV 30/5/7, PA 25/10 (16), PCWP 7, Fick CO/CI 6.3/2.7, PVR 1.5 WU, PA 61 and 66%  . RBBB (right bundle branch block)     Old  . Urinary incontinence   . Obesity   . Syncope     likely due to low blood sugar  . Carotid artery disease (HCC)   . Acute  MI [facility]) 1999; 2007  . CHF (congestive heart failure) (HCC)   . Anxiety   . Depression   . Type II diabetes mellitus (HCC)   . Peripheral neuropathy (HCC)   . Venous insufficiency   . Stroke [facility])     mini strokes  . PONV (postoperative nausea and vomiting)   . Renal insufficiency     DENIES  . Anemia     hx  . Anginal pain (HCC)   . Exertional shortness of breath   . Daily headache     "~ every other day; since I fell in June" (03/15/2014)  . Osteoarthritis   . Arthritis     "generalized" (03/15/2014)    Current Outpatient Prescriptions  Medication Sig Dispense Refill  . albuterol (PROVENTIL HFA;VENTOLIN HFA) 108 (90 BASE) MCG/ACT inhaler Inhale 2 puffs into the lungs every 6 (six) hours as  needed for wheezing or shortness of breath. Reported on 10/30/2015    . aspirin EC 81 MG tablet Take 81 mg by mouth daily.    . carvedilol (COREG) 12.5 MG tablet Take 1.5 tablets (18.75 mg total) by mouth 2 (two) times daily with a meal. 90 tablet 6  . clonazePAM (KLONOPIN) 1 MG tablet Take 1 tablet (1 mg total) by mouth 3 (three) times daily. 90 tablet 2  . clopidogrel (PLAVIX) 75 MG tablet TAKE 1 TABLET (75 MG TOTAL) BY MOUTH DAILY WITH BREAKFAST. 90 tablet 2  . doxycycline (VIBRA-TABS) 100 MG tablet Take 1 tablet (100 mg total) by mouth every 12 (twelve) hours. 20 tablet 0  . ferrous sulfate 325 (65 FE) MG tablet Take 325 mg by mouth daily with breakfast. Reported on 08/21/2015    . insulin NPH-regular Human (NOVOLIN 70/30) (70-30) 100 UNIT/ML injection Inject 30-35 Units into the skin 2 (two) times daily. 35 units in the morning and 30 units at lunch (no evening doses)    . isosorbide dinitrate (ISORDIL) 10 MG tablet TAKE 1 TABLET (10 MG TOTAL) BY MOUTH 2 (TWO) TIMES DAILY. 60 tablet 10  . levothyroxine (SYNTHROID, LEVOTHROID) 75 MCG tablet Take 1 tablet (75 mcg total) by mouth daily before breakfast. 30 tablet 3  . losartan (COZAAR) 50 MG tablet TAKE 1 TABLET BY MOUTH DAILY 90 tablet 1  . metFORMIN (GLUMETZA) 500 MG (MOD) 24 hr tablet Take 500 mg by mouth 2 (two) times daily with a meal.    . nitroGLYCERIN (NITROSTAT) 0.4 MG SL tablet Place 1 tablet (0.4 mg total) under the tongue every 5 (five) minutes as needed for chest pain. 25 tablet 3  . PARoxetine (PAXIL) 20 MG tablet Take 1 tablet (20 mg total) by mouth daily. 30 tablet 1  . potassium chloride SA (K-DUR,KLOR-CON) 20 MEQ tablet Take 20 mEq by mouth 2 (two) times daily.    . pregabalin (LYRICA) 150 MG capsule Take 150 mg by mouth 3 (three) times daily. Reported on 10/31/2015    . torsemide (DEMADEX) 20 MG tablet Take 4 tablets (80 mg total) by mouth daily. 240 tablet 2   No current facility-administered medications for this encounter.     Filed Vitals:   11/07/15 1015  BP: 124/66  Pulse: 90  Weight: 288 lb 9.6 oz (130.908 kg)  SpO2: 92%   PHYSICAL EXAM: General:  Chronically ill appearing. Walked in the clinic.      HEENT: normal Neck: supple. JVP difficult to assess d/t body habitus. Appear elevated.     Carotids 2+ bilaterally; no bruits. No lymphadenopathy or thryomegaly  appreciated. Cor: PMI normal. Regular rate & rhythm. No rubs, gallops or murmurs. Lungs: decreased in the bases.  Abdomen: obese, soft, nontender, distended. No hepatosplenomegaly. No bruits or masses. Good bowel sounds. Extremities: no cyanosis, clubbing, rash, R and LLE 1+ edema.   Neuro: alert & orientedx3, cranial nerves grossly intact. Moves all 4 extremities w/o difficulty. Affect pleasant.     ASSESSMENT & PLAN: 1. Chronic Diastolic Heart Failure: NICM, EF 55-60% Grade II DD(06/2015 )  -NYHA III symptoms.Looks a little better. Volume status ok.  Continue torsemide 80 mg daily + 20 meq potassium twice a day.    -Continue losartan 50 mg daily.    - Reinforced the need and importance of daily weights, a low sodium diet, and fluid restriction (less than 2 L a day). Instructed to call the HF clinic if weight increases more than 3 lbs overnight or 5 lbs in a week 2.. Dyspnea- She has been offered sleep study many times but refuses.  Continue inhalers as needed.   3. Uncontrolled DM:  Has follow up with Dr Lurene Shadow next week. Received new glucometer from Dr Lurene Shadow. ,   4. CAD- no evidence of ischemia- on aspirin and statin.  5. Obesity: Discussed portion control the importance of weight loss.  6. HTN-  Stable. Continue current regimen.     7. CKD Stage III- BMET reviewed and stable from 10/30/2015. Referred to Nephrology next week. 8. Nausea: I have asked her to follow up with PCP. ? Related to paxil that was started on 11/05/15.   Follow up next week.  Agreeable to [facility]. Refer to [facility] and include diuretic protocol. [name] will also follow closely.      Hilarie Sinha NP-C  10:38 AM

## 2015-11-07 NOTE — Progress Notes (Signed)
Advanced Heart Failure Medication Review by a Pharmacist  Does the patient  feel that his/her medications are working for him/her?  yes  Has the patient been experiencing any side effects to the medications prescribed?  no  Does the patient measure his/her own blood pressure or blood glucose at home?  no   Does the patient have any problems obtaining medications due to transportation or finances?   no  Understanding of regimen: fair Understanding of indications: fair Potential of compliance: good Patient understands to avoid NSAIDs. Patient understands to avoid decongestants.   Pharmacist comments: Susan [name] is a pleasant 77 yof presenting to clinic for a follow-up appointment. She states that she is not experiencing any medication-related side effects at this time. She is complaining of nausea and dizziness upon standing and when bending over. She does not take her BP at home but states that she rarely misses any doses of her medications. She did not have any other medication-related questions or concerns at this time.    Rocklin Soderquist C. Lennox Grumbles, PharmD Pharmacy Resident  Pager: [phone] 11/07/2015 10:40 AM    Time with patient: 15 min  Preparation and documentation time: 3 min  Total time: 18 min

## 2015-11-07 NOTE — Patient Instructions (Addendum)
Your physician recommends that you schedule a follow-up appointment in: 7 days with Darrick Grinder, NP  You have been referred to [place] for CHF management, the will contact you to arrange your first visit  Do the following things EVERYDAY: 1) Weigh yourself in the morning before breakfast. Write it down and keep it in a log. 2) Take your medicines as prescribed 3) Eat low salt foods-Limit salt (sodium) to 2000 mg per day.  4) Stay as active as you can everyday 5) Limit all fluids for the day to less than 2 liters 6)

## 2015-11-08 ENCOUNTER — Telehealth: Payer: Self-pay | Admitting: Physician Assistant

## 2015-11-08 NOTE — Telephone Encounter (Signed)
Has taken Paxil for 3 days and it is making her very nauseated.  Please advise?

## 2015-11-08 NOTE — Telephone Encounter (Signed)
Patient calling to talk to you but did not specify the reason she said asap on the message  [phone]

## 2015-11-09 ENCOUNTER — Telehealth (HOSPITAL_COMMUNITY): Payer: Self-pay | Admitting: *Deleted

## 2015-11-09 DIAGNOSIS — E1165 Type 2 diabetes mellitus with hyperglycemia: Secondary | ICD-10-CM | POA: Diagnosis not present

## 2015-11-09 DIAGNOSIS — E039 Hypothyroidism, unspecified: Secondary | ICD-10-CM | POA: Diagnosis not present

## 2015-11-09 DIAGNOSIS — N183 Chronic kidney disease, stage 3 (moderate): Secondary | ICD-10-CM | POA: Diagnosis not present

## 2015-11-09 DIAGNOSIS — I2510 Atherosclerotic heart disease of native coronary artery without angina pectoris: Secondary | ICD-10-CM | POA: Diagnosis not present

## 2015-11-09 DIAGNOSIS — E1122 Type 2 diabetes mellitus with diabetic chronic kidney disease: Secondary | ICD-10-CM | POA: Diagnosis not present

## 2015-11-09 DIAGNOSIS — Z7951 Long term (current) use of inhaled steroids: Secondary | ICD-10-CM | POA: Diagnosis not present

## 2015-11-09 DIAGNOSIS — Z7984 Long term (current) use of oral hypoglycemic drugs: Secondary | ICD-10-CM | POA: Diagnosis not present

## 2015-11-09 DIAGNOSIS — D649 Anemia, unspecified: Secondary | ICD-10-CM | POA: Diagnosis not present

## 2015-11-09 DIAGNOSIS — E669 Obesity, unspecified: Secondary | ICD-10-CM | POA: Diagnosis not present

## 2015-11-09 DIAGNOSIS — F419 Anxiety disorder, unspecified: Secondary | ICD-10-CM | POA: Diagnosis not present

## 2015-11-09 DIAGNOSIS — E1140 Type 2 diabetes mellitus with diabetic neuropathy, unspecified: Secondary | ICD-10-CM | POA: Diagnosis not present

## 2015-11-09 DIAGNOSIS — I130 Hypertensive heart and chronic kidney disease with heart failure and stage 1 through stage 4 chronic kidney disease, or unspecified chronic kidney disease: Secondary | ICD-10-CM | POA: Diagnosis not present

## 2015-11-09 DIAGNOSIS — F329 Major depressive disorder, single episode, unspecified: Secondary | ICD-10-CM | POA: Diagnosis not present

## 2015-11-09 DIAGNOSIS — I872 Venous insufficiency (chronic) (peripheral): Secondary | ICD-10-CM | POA: Diagnosis not present

## 2015-11-09 DIAGNOSIS — Z7982 Long term (current) use of aspirin: Secondary | ICD-10-CM | POA: Diagnosis not present

## 2015-11-09 DIAGNOSIS — I5033 Acute on chronic diastolic (congestive) heart failure: Secondary | ICD-10-CM | POA: Diagnosis not present

## 2015-11-09 DIAGNOSIS — M150 Primary generalized (osteo)arthritis: Secondary | ICD-10-CM | POA: Diagnosis not present

## 2015-11-09 DIAGNOSIS — R32 Unspecified urinary incontinence: Secondary | ICD-10-CM | POA: Diagnosis not present

## 2015-11-09 MED ORDER — PAROXETINE HCL ER 25 MG PO TB24: 25.0000 mg | ORAL_TABLET | Freq: Every day | ORAL | Status: DC

## 2015-11-09 NOTE — Telephone Encounter (Signed)
Pt called and discuss change in therapy.  New rx to pharmacy and 6 week appt already scheduled

## 2015-11-09 NOTE — Telephone Encounter (Signed)
She was on Paxil CR and tolerated that---Rx--Paxil CR 25mg  1 po QD # 30 +1.  Tell her we have to start at this dose and titrate up over time.  F/U OV 6 weeks--can titrate up dose at that time

## 2015-11-09 NOTE — Telephone Encounter (Signed)
Cheryl with [facility] called with questions about the diuretic protocol. Heather explained to her that [facility] has a kit they should send to the patients home with IV lasix and IV supplies should patient ever need it. Malachy Mood will contact her branch to get diuretic protocol and IV Lasix kit

## 2015-11-12 ENCOUNTER — Ambulatory Visit: Payer: Self-pay | Admitting: *Deleted

## 2015-11-12 ENCOUNTER — Other Ambulatory Visit: Payer: Self-pay | Admitting: *Deleted

## 2015-11-12 DIAGNOSIS — R809 Proteinuria, unspecified: Secondary | ICD-10-CM | POA: Diagnosis not present

## 2015-11-12 DIAGNOSIS — G609 Hereditary and idiopathic neuropathy, unspecified: Secondary | ICD-10-CM | POA: Diagnosis not present

## 2015-11-12 DIAGNOSIS — I10 Essential (primary) hypertension: Secondary | ICD-10-CM | POA: Diagnosis not present

## 2015-11-12 DIAGNOSIS — E1165 Type 2 diabetes mellitus with hyperglycemia: Secondary | ICD-10-CM | POA: Diagnosis not present

## 2015-11-12 NOTE — Patient Outreach (Signed)
[facility]) Care Management [place] Telephone Outreach, Transition of Care, day 15 11/12/2015  Audreana Lisbon 1951-04-27 [id number]   Unsuccessful telephone outreach to [name], a 65 y.o. female previously followed by [place] for for self-management of chronic disease states of CHF and DM.Patient had previous hospital visit IP hospital visit in February 2017, and was readmitted June 1-3, 2017 for AMS, hypothermia, and hypoglycemia. HIPAA compliant voice mail message left for [place], asking her to return my call.  Plan:  If I do not hear back from [place] this afternoon, I will re-attempt telephone outreach for continued transition of care tomorrow.  [name], RN, BSN, [employer] [facility] Care Management  [phone] '

## 2015-11-13 ENCOUNTER — Other Ambulatory Visit: Payer: Self-pay | Admitting: *Deleted

## 2015-11-13 NOTE — Patient Outreach (Signed)
[facility]) Care Management [place] Telephone Outreach, Transition of Care day 16 11/13/2015  Susan [name] 01-21-1951 [id number]  Successful telephone outreach to Susan [name], a 65 y.o. female previously followed by [place] for for self-management of chronic disease states of CHF and DM.Patient had previous hospital visit IP hospital visit in February 2017, and was readmitted June 1-3, 2017 for AMS, hypothermia, and hypoglycemia.  Today, Susan [name] reports that she is "doing better," stating that she had a scheduled office visit with her endocrinologist Dr. [name] yesterday.  Susan [name] reports that she has continued following the instructions of Dr. [name] to eliminate her evening dose of insulin and states that she has not experienced any further early morning episodes of low blood sugars.  Susan [name] reports that Dr. [name] is also working with her on a low carb diet.  Susan [name] confirmed today that she obtained her new blood glucose monitor from Dr. [name] "last week."   Susan [name] states that she has continued monitoring and recording her blood sugars 3-4 times daily, reporting that "they are pretty good in the morning, but by the evening, they are always too high."  Susan [name] reports that she shared her blood sugar readings with Dr. [name] during their visit yesterday.  Susan [name] also reports that she has attended all other provider appointments to her PCP and to the Heart/ Vascular clinic.  Susan [name] reports that she is having her leg wrappings managed by her PCP, and states that her legs "look better."  Susan [name] also states that she is continuing to use her walker, and that home health services are in progress and are "going okay."  Susan [name] reports that she has continued to monitor and record her daily weight, stating "I'm staying right at 283 pounds."    Susan [name] denies further concerns, needs, questions, or problems today, and I reinforced her overall plan of care with her, which she was agreeable to.     Plan:  Susan [name] will take all of her medications as they are prescribed and will keep all scheduled provider appointments.  Susan [name] will call EMS for any urgent or emergent issues or concerns she experiences. Susan [name] will contact her care/ medical providers for any questions, concerns, issues, or problems that she experiences prior to scheduled office visits.  Susan [name] will continue to monitor and record her weight daily and will take these values to her providers appointments with her.  Susan [name] will continue using her walker and will work with home health nursing and PT services which have been ordered.  [place] outreach for transition of care after recent hospital discharge to continue with telephone outreach next week.   [name], RN, BSN, [employer] [facility] Care Management  [phone]

## 2015-11-14 ENCOUNTER — Encounter (HOSPITAL_COMMUNITY): Payer: Self-pay

## 2015-11-14 ENCOUNTER — Ambulatory Visit (HOSPITAL_COMMUNITY)
Admission: RE | Admit: 2015-11-14 | Discharge: 2015-11-14 | Disposition: A | Discharge status: Home / Self Care | Payer: PPO | Source: Ambulatory Visit | Attending: Cardiology | Admitting: Cardiology

## 2015-11-14 VITALS — BP 121/60 | HR 76 | Wt 285.4 lb

## 2015-11-14 DIAGNOSIS — E1165 Type 2 diabetes mellitus with hyperglycemia: Secondary | ICD-10-CM | POA: Insufficient documentation

## 2015-11-14 DIAGNOSIS — Z794 Long term (current) use of insulin: Secondary | ICD-10-CM | POA: Diagnosis not present

## 2015-11-14 DIAGNOSIS — Z6841 Body Mass Index (BMI) 40.0 and over, adult: Secondary | ICD-10-CM | POA: Insufficient documentation

## 2015-11-14 DIAGNOSIS — Z8673 Personal history of transient ischemic attack (TIA), and cerebral infarction without residual deficits: Secondary | ICD-10-CM | POA: Insufficient documentation

## 2015-11-14 DIAGNOSIS — Z79899 Other long term (current) drug therapy: Secondary | ICD-10-CM | POA: Diagnosis not present

## 2015-11-14 DIAGNOSIS — Z7902 Long term (current) use of antithrombotics/antiplatelets: Secondary | ICD-10-CM | POA: Insufficient documentation

## 2015-11-14 DIAGNOSIS — M1990 Unspecified osteoarthritis, unspecified site: Secondary | ICD-10-CM | POA: Insufficient documentation

## 2015-11-14 DIAGNOSIS — E039 Hypothyroidism, unspecified: Secondary | ICD-10-CM | POA: Insufficient documentation

## 2015-11-14 DIAGNOSIS — I130 Hypertensive heart and chronic kidney disease with heart failure and stage 1 through stage 4 chronic kidney disease, or unspecified chronic kidney disease: Secondary | ICD-10-CM | POA: Diagnosis not present

## 2015-11-14 DIAGNOSIS — I428 Other cardiomyopathies: Secondary | ICD-10-CM | POA: Insufficient documentation

## 2015-11-14 DIAGNOSIS — Z87891 Personal history of nicotine dependence: Secondary | ICD-10-CM | POA: Diagnosis not present

## 2015-11-14 DIAGNOSIS — Z7982 Long term (current) use of aspirin: Secondary | ICD-10-CM | POA: Diagnosis not present

## 2015-11-14 DIAGNOSIS — I872 Venous insufficiency (chronic) (peripheral): Secondary | ICD-10-CM | POA: Insufficient documentation

## 2015-11-14 DIAGNOSIS — I252 Old myocardial infarction: Secondary | ICD-10-CM | POA: Diagnosis not present

## 2015-11-14 DIAGNOSIS — E785 Hyperlipidemia, unspecified: Secondary | ICD-10-CM | POA: Diagnosis not present

## 2015-11-14 DIAGNOSIS — E669 Obesity, unspecified: Secondary | ICD-10-CM | POA: Insufficient documentation

## 2015-11-14 DIAGNOSIS — N183 Chronic kidney disease, stage 3 (moderate): Secondary | ICD-10-CM | POA: Diagnosis not present

## 2015-11-14 DIAGNOSIS — E1142 Type 2 diabetes mellitus with diabetic polyneuropathy: Secondary | ICD-10-CM | POA: Diagnosis not present

## 2015-11-14 DIAGNOSIS — I5032 Chronic diastolic (congestive) heart failure: Secondary | ICD-10-CM | POA: Insufficient documentation

## 2015-11-14 DIAGNOSIS — R0600 Dyspnea, unspecified: Secondary | ICD-10-CM | POA: Insufficient documentation

## 2015-11-14 DIAGNOSIS — Z8249 Family history of ischemic heart disease and other diseases of the circulatory system: Secondary | ICD-10-CM | POA: Diagnosis not present

## 2015-11-14 DIAGNOSIS — Z955 Presence of coronary angioplasty implant and graft: Secondary | ICD-10-CM | POA: Insufficient documentation

## 2015-11-14 DIAGNOSIS — I2510 Atherosclerotic heart disease of native coronary artery without angina pectoris: Secondary | ICD-10-CM | POA: Insufficient documentation

## 2015-11-14 DIAGNOSIS — N182 Chronic kidney disease, stage 2 (mild): Secondary | ICD-10-CM | POA: Diagnosis not present

## 2015-11-14 NOTE — Progress Notes (Signed)
Patient ID: Susan [name], female   DOB: December 05, 1950, 65 y.o.   MRN: [id number]   PCP: Dr Edilia Bo  Cardiologist: Dr Myrtis Ser  Endocrinologist: Dr Lurene Shadow  Primary HF Cardiologist: Dr Leory Plowman   HPI: Susan [name] is a 65 year old with a history of RBBB, DM, Hypothyroid, diastolic heart failure, CAD MI 2000/2007 BMS 2007, TIA, obesity.   Admitted 06/07/15 with N,V, hyperglycemia. Creatinine was up to 1.9. On the day discharge creatinine was back down to 1.2 Thought to be dehydrated so diuretics were held. Diuretics restarted on the day discharge. Discharge weight 282 pounds.   Admitted 2/13 through 07/13/15 with gastroenteritis and hyper glycemia. Diuretic initially held due to dehydration. Losartan and metolazone stopped with AKI/CKD. Discharge weight was 283 pounds.   Admitted June 1st through June 3rd with altered mental status, hypothermia, and weakness. Diuretics initially held and later restarted. Unfortunately when she was discharged she refused home health.  Today she returns for HF follow up. Since last visit she saw endocrinologist. Glucose better controlled. Since the last visit she stopped paxil and nausea has resolved. Overall she feels better. Able to walk a little more. Mild dyspnea with exertion. Weight at home 285-286 pounds. Limiting fluid intake. Taking all medications.  Drinking > 2 liters of fluid. Has follow up with Dr Lurene Shadow Monday.   ECHO 07/11/2015- 50-55%.  Grade I DD  RHC 09/22/13: RA = 4  RV = 30/5/7  PA = 25/10 (16)  PCW = 7  Fick cardiac output/index = 6.3/2.7  PVR = 1.5 WU   FA sat = 93%  PA sat = 61%, 66%   Labs: 07/18/13 K 4.8 Creatinine 1.38 Glucose 1005 she refused to go to [facility] Labs : 08/17/13 K 3.9 Creatinine 1.4 Glucose 499 Labs: 09/29/13 K 4.5 Creatinine 1.4  Labs 04/18/14 K 4.4 Creatinine 1.18 Labs 03/26/2015: K 5.3 Creatinine 1.41  Labs 05/01/2015: K 4.9 Creatinine 1.8 Labs 05/04/2015: K 5.0 Creatinine 1.74  Labs 06/10/2015: K 4.7 Creatinine  1.21 Labs 06/12/2015: K 6.3 Creatinine 2.01  Labs 06/19/2015: K 4.5 Creatinine 1.29  Labs 07/10/2015: Hgb A1C 9.5  Labs 07/12/2015: K 4.4 Creatinine 1.62 K 4.4  Labs 08/10/2015: K 5.3 Creatinine 1.44  Labs 10/03/2015: K 4.5 Creatinine 1.41  Labs 10/19/15: K 4.7 creatinine 1.75  Labs 10/28/2015: K 4.5 Creatinine 1.2    SH: Former smoker quit 15 year ago. Does drink alcohol. She is disabled. Lives at home with her husband and disabled son - Husband is truck Hospital doctor. Son has a brain injury after he attempted suicide 2 years ago.    FH: Mom MI  ROS: All systems negative except as listed in HPI, PMH and Problem List.  Past Medical History  Diagnosis Date  . Tachycardia     Sinus tachycardia  . Dyslipidemia   . Hypothyroidism   . CAD (coronary artery disease)     MI in 2000 - MI  2007 - treated bare metal stent (no nuclear since then as 9/11)  . HTN (hypertension)   . Chronic diastolic heart failure (HCC)     a) ECHO (08/2013) EF 55-60% and RV function nl b) RHC (08/2013) RA 4, RV 30/5/7, PA 25/10 (16), PCWP 7, Fick CO/CI 6.3/2.7, PVR 1.5 WU, PA 61 and 66%  . RBBB (right bundle branch block)     Old  . Urinary incontinence   . Obesity   . Syncope     likely due to low blood sugar  . Carotid artery disease (HCC)   .  Acute MI (HCC) 1999; 2007  . CHF (congestive heart failure) (HCC)   . Anxiety   . Depression   . Type II diabetes mellitus (HCC)   . Peripheral neuropathy (HCC)   . Venous insufficiency   . Stroke [facility])     mini strokes  . PONV (postoperative nausea and vomiting)   . Renal insufficiency     DENIES  . Anemia     hx  . Anginal pain (HCC)   . Exertional shortness of breath   . Daily headache     "~ every other day; since I fell in June" (03/15/2014)  . Osteoarthritis   . Arthritis     "generalized" (03/15/2014)    Current Outpatient Prescriptions  Medication Sig Dispense Refill  . albuterol (PROVENTIL HFA;VENTOLIN HFA) 108 (90 BASE) MCG/ACT inhaler Inhale 2 puffs into  the lungs every 6 (six) hours as needed for wheezing or shortness of breath. Reported on 10/30/2015    . aspirin EC 81 MG tablet Take 81 mg by mouth daily.    . carvedilol (COREG) 12.5 MG tablet Take 1.5 tablets (18.75 mg total) by mouth 2 (two) times daily with a meal. 90 tablet 6  . clonazePAM (KLONOPIN) 1 MG tablet Take 1 tablet (1 mg total) by mouth 3 (three) times daily. 90 tablet 2  . clopidogrel (PLAVIX) 75 MG tablet TAKE 1 TABLET (75 MG TOTAL) BY MOUTH DAILY WITH BREAKFAST. 90 tablet 2  . ferrous sulfate 325 (65 FE) MG tablet Take 325 mg by mouth daily with breakfast. Reported on 08/21/2015    . insulin NPH-regular Human (NOVOLIN 70/30) (70-30) 100 UNIT/ML injection Inject 30-35 Units into the skin 2 (two) times daily. 35 units in the morning and 30 units at lunch (no evening doses)    . isosorbide dinitrate (ISORDIL) 10 MG tablet TAKE 1 TABLET (10 MG TOTAL) BY MOUTH 2 (TWO) TIMES DAILY. 60 tablet 10  . levothyroxine (SYNTHROID, LEVOTHROID) 75 MCG tablet Take 1 tablet (75 mcg total) by mouth daily before breakfast. 30 tablet 3  . losartan (COZAAR) 50 MG tablet TAKE 1 TABLET BY MOUTH DAILY 90 tablet 1  . metFORMIN (GLUMETZA) 500 MG (MOD) 24 hr tablet Take 500 mg by mouth 2 (two) times daily with a meal.    . PARoxetine (PAXIL-CR) 25 MG 24 hr tablet Take 1 tablet (25 mg total) by mouth daily. 30 tablet 1  . potassium chloride SA (K-DUR,KLOR-CON) 20 MEQ tablet Take 20 mEq by mouth 2 (two) times daily.    . pregabalin (LYRICA) 150 MG capsule Take 150 mg by mouth 3 (three) times daily. Reported on 10/31/2015    . torsemide (DEMADEX) 20 MG tablet Take 4 tablets (80 mg total) by mouth daily. 240 tablet 2  . nitroGLYCERIN (NITROSTAT) 0.4 MG SL tablet Place 1 tablet (0.4 mg total) under the tongue every 5 (five) minutes as needed for chest pain. (Patient not taking: Reported on 11/14/2015) 25 tablet 3   No current facility-administered medications for this encounter.    Filed Vitals:   11/14/15 1213    BP: 121/60  Pulse: 76  Weight: 285 lb 6 oz (129.445 kg)  SpO2: 98%   PHYSICAL EXAM: General:  Chronically ill appearing. Walked in the clinic.      HEENT: normal Neck: supple. JVP difficult to assess d/t body habitus. Does not appear elevated.     Carotids 2+ bilaterally; no bruits. No lymphadenopathy or thryomegaly appreciated. Cor: PMI normal. Regular rate & rhythm. No rubs,  gallops or murmurs. Lungs: decreased in the bases.  Abdomen: obese, soft, nontender, distended. No hepatosplenomegaly. No bruits or masses. Good bowel sounds. Extremities: no cyanosis, clubbing, rash, R and LLE trace - 1+ edema. Chronic lower extremity hyperpigmentation  Neuro: alert & orientedx3, cranial nerves grossly intact. Moves all 4 extremities w/o difficulty. Affect pleasant.     ASSESSMENT & PLAN: 1. Chronic Diastolic Heart Failure: NICM, EF 55-60% Grade II DD(06/2015 )  -NYHA III symptoms. Funcitonally able to walk a little more.  Volume status ok.  Continue torsemide 80 mg daily + 20 meq potassium twice a day.    -Continue losartan 50 mg daily.    - Reinforced the need and importance of daily weights, a low sodium diet, and fluid restriction (less than 2 L a day). Instructed to call the HF clinic if weight increases more than 3 lbs overnight or 5 lbs in a week 2.. Dyspnea- She has been offered sleep study many times but refuses.  Continue inhalers as needed.   3. Uncontrolled DM:  Has follow up with Dr Lurene Shadow next week. Received new glucometer from Dr Lurene Shadow. Seems to better controlled.   4. CAD- no evidence of ischemia- on aspirin and statin.  5. Obesity: Discussed portion control the importance of weight loss.  6. HTN-  Stable. Continue current regimen.     7. CKD Stage III- BMET reviewed and stable from 10/28/2015.  8. Nausea: resolved once she stopped paxil.    Follow up in 5-6 weeks.  [name] will also follow closely.     [place] Souter NP-C  12:30 PM

## 2015-11-14 NOTE — Patient Instructions (Signed)
Your physician recommends that you schedule a follow-up appointment in: 5-6 weeks follow up  Do the following things EVERYDAY: 1) Weigh yourself in the morning before breakfast. Write it down and keep it in a log. 2) Take your medicines as prescribed 3) Eat low salt foods-Limit salt (sodium) to 2000 mg per day.  4) Stay as active as you can everyday 5) Limit all fluids for the day to less than 2 liters 6)

## 2015-11-14 NOTE — Progress Notes (Signed)
Advanced Heart Failure Medication Review by a Pharmacist  Does the patient  feel that his/her medications are working for him/her?  yes  Has the patient been experiencing any side effects to the medications prescribed?  no  Does the patient measure his/her own blood pressure or blood glucose at home?  no   Does the patient have any problems obtaining medications due to transportation or finances?   no  Understanding of regimen: good Understanding of indications: good Potential of compliance: good Patient understands to avoid NSAIDs. Patient understands to avoid decongestants.  Issues to address at subsequent visits: None   Pharmacist comments:  Ms. Brys is a pleasant 65 yo F presenting without a medication list but with great recall of her regimen. She reports good compliance with her regimen and has not had any changes in her HF medications since we saw her last week.   Ruta Hinds. Velva Harman, PharmD, BCPS, CPP Clinical Pharmacist Pager: [phone] Phone: [phone] 11/14/2015 12:22 PM      Time with patient: 10 minutes Preparation and documentation time: 2 minutes Total time: 12 minutes

## 2015-11-16 DIAGNOSIS — F419 Anxiety disorder, unspecified: Secondary | ICD-10-CM | POA: Diagnosis not present

## 2015-11-16 DIAGNOSIS — I872 Venous insufficiency (chronic) (peripheral): Secondary | ICD-10-CM | POA: Diagnosis not present

## 2015-11-16 DIAGNOSIS — D649 Anemia, unspecified: Secondary | ICD-10-CM | POA: Diagnosis not present

## 2015-11-16 DIAGNOSIS — I2510 Atherosclerotic heart disease of native coronary artery without angina pectoris: Secondary | ICD-10-CM | POA: Diagnosis not present

## 2015-11-16 DIAGNOSIS — E1165 Type 2 diabetes mellitus with hyperglycemia: Secondary | ICD-10-CM | POA: Diagnosis not present

## 2015-11-16 DIAGNOSIS — Z7984 Long term (current) use of oral hypoglycemic drugs: Secondary | ICD-10-CM | POA: Diagnosis not present

## 2015-11-16 DIAGNOSIS — F329 Major depressive disorder, single episode, unspecified: Secondary | ICD-10-CM | POA: Diagnosis not present

## 2015-11-16 DIAGNOSIS — I130 Hypertensive heart and chronic kidney disease with heart failure and stage 1 through stage 4 chronic kidney disease, or unspecified chronic kidney disease: Secondary | ICD-10-CM | POA: Diagnosis not present

## 2015-11-16 DIAGNOSIS — N183 Chronic kidney disease, stage 3 (moderate): Secondary | ICD-10-CM | POA: Diagnosis not present

## 2015-11-16 DIAGNOSIS — R32 Unspecified urinary incontinence: Secondary | ICD-10-CM | POA: Diagnosis not present

## 2015-11-16 DIAGNOSIS — Z7951 Long term (current) use of inhaled steroids: Secondary | ICD-10-CM | POA: Diagnosis not present

## 2015-11-16 DIAGNOSIS — I5033 Acute on chronic diastolic (congestive) heart failure: Secondary | ICD-10-CM | POA: Diagnosis not present

## 2015-11-16 DIAGNOSIS — E1140 Type 2 diabetes mellitus with diabetic neuropathy, unspecified: Secondary | ICD-10-CM | POA: Diagnosis not present

## 2015-11-16 DIAGNOSIS — Z7982 Long term (current) use of aspirin: Secondary | ICD-10-CM | POA: Diagnosis not present

## 2015-11-16 DIAGNOSIS — E1122 Type 2 diabetes mellitus with diabetic chronic kidney disease: Secondary | ICD-10-CM | POA: Diagnosis not present

## 2015-11-16 DIAGNOSIS — M150 Primary generalized (osteo)arthritis: Secondary | ICD-10-CM | POA: Diagnosis not present

## 2015-11-16 DIAGNOSIS — E669 Obesity, unspecified: Secondary | ICD-10-CM | POA: Diagnosis not present

## 2015-11-16 DIAGNOSIS — E039 Hypothyroidism, unspecified: Secondary | ICD-10-CM | POA: Diagnosis not present

## 2015-11-19 ENCOUNTER — Other Ambulatory Visit: Payer: Self-pay | Admitting: Licensed Clinical Social Worker

## 2015-11-19 NOTE — Patient Outreach (Signed)
Assessment:  CSW spoke via phone with client on 11/19/15. CSW verified client identity. CSW and client spoke of client needs. Susan [name] is receiving [facility] nursing support with RN Reginia Naas. Client uses a walker to help her ambulate. She sees Dr. Debbora Presto, endocrinologist, to help her monitor client's sugar levels. She said she had an appointment recently with Dr. Debbora Presto. Client said she is eating well. She said she drives herself to her scheduled medical appointments. She is trying to attend all scheduled medical appointments. CSW and client spoke of client care plan. CSW encouraged client to attend all scheduled client medical appointments in next 30 days. Client said she [facility]. She has some support from her spouse but her spouse drives a truck for his job and her spouse is sometimes away from home with his job.  Client has worked extensively with RN Reginia Naas regarding nursing needs of client.  Order was recently sent by primary medical provider for client for client to begin receiving home health nursing support and to receive home health physical therapy support.  Client has received new glucometer recently and has been encouraged by her medical provider and by [facility] RN to use glucometer as needed to monitor blood sugar reading of client. Client was recently restarted on Paxil, as prescribed, by her primary care provider.  Client said she had been crying regularly and is glad that medical provider has started client again on prescribed dose of Paxil. CSW and client spoke of grab bars needed in bathroom of client.  Client said she is still waiting for volunteers from [facility] to complete grad bar installation needed in client's bathroom. CSW reminded client that she had been referred for this assistance from [facility] and sometimes it may take a few months for volunteers to be able to complete task referred.  Client understood this information. CSW thanked Susan [name] for phone conversation with CSW on 11/19/15.   Plan:  Client to attend all scheduled client medical appointments in the next 30 days. CSW to collaborate with RN Reginia Naas as needed to monitor needs of client. CSW to call client in 4 weeks to assess client needs at that time.  Susan [name] MSW, LCSW Licensed Clinical Social Worker [facility] Care Management [phone]

## 2015-11-20 ENCOUNTER — Other Ambulatory Visit: Payer: Self-pay | Admitting: *Deleted

## 2015-11-20 ENCOUNTER — Ambulatory Visit: Payer: Self-pay | Admitting: *Deleted

## 2015-11-20 NOTE — Patient Outreach (Signed)
[place] [facility]) Care Management [place] Telephone Outreach, Transition of Care day 23 11/20/2015  [name] November 26, 1950 [id number]  Unsuccessful telephone outreach attempt to Susan [name], a 65 y.o. female previously followed by [place] for for self-management of chronic disease states of CHF and DM.Patient had previous IP hospital visit in February 2017, and was readmitted June 1-3, 2017 for AMS, hypothermia, and hypoglycemia.  HIPAA compliant voice mail message left for patient, asking her to return my call.  Plan:  Will re-attempt [place] telephone outreach for continued transition of care later this week, if I do not hear back from patient.   [name], RN, BSN, [employer] [facility] Care Management  [phone]

## 2015-11-22 ENCOUNTER — Ambulatory Visit
Admission: RE | Admit: 2015-11-22 | Discharge: 2015-11-22 | Disposition: A | Discharge status: Home / Self Care | Payer: PPO | Source: Ambulatory Visit | Attending: Obstetrics and Gynecology | Admitting: Obstetrics and Gynecology

## 2015-11-22 ENCOUNTER — Other Ambulatory Visit: Payer: Self-pay | Admitting: *Deleted

## 2015-11-22 ENCOUNTER — Ambulatory Visit: Payer: Self-pay | Admitting: *Deleted

## 2015-11-22 DIAGNOSIS — R922 Inconclusive mammogram: Secondary | ICD-10-CM | POA: Diagnosis not present

## 2015-11-22 DIAGNOSIS — R928 Other abnormal and inconclusive findings on diagnostic imaging of breast: Secondary | ICD-10-CM

## 2015-11-22 DIAGNOSIS — N6489 Other specified disorders of breast: Secondary | ICD-10-CM | POA: Diagnosis not present

## 2015-11-22 NOTE — Patient Outreach (Signed)
[facility]) Care Management     [place] Telephone Outreach, Transition of Care day 25 11/22/2015  Demarie Atz 07/11/1950 [id number]   Unsuccessful telephone outreach attempt to [name], a 65 y.o. female previously followed by [place] for for self-management of chronic disease states of CHF and DM.Patient had previous IP hospital visit in February 2017, and was readmitted June 1-3, 2017 for AMS, hypothermia, and hypoglycemia. Person who answered phone stated that [name] was not available at this time.  I asked that person to please have [name] call me back, and provided my contact details.   Plan:  Will re-attempt [name] telephone outreach for continued transition of care tomorrow, if I do not hear back from patient this afternoon.  [name], RN, BSN, [employer] [facility] Care Management  [phone]

## 2015-11-23 ENCOUNTER — Other Ambulatory Visit: Payer: Self-pay | Admitting: *Deleted

## 2015-11-23 ENCOUNTER — Ambulatory Visit: Payer: Self-pay | Admitting: *Deleted

## 2015-11-23 DIAGNOSIS — E1140 Type 2 diabetes mellitus with diabetic neuropathy, unspecified: Secondary | ICD-10-CM | POA: Diagnosis not present

## 2015-11-23 DIAGNOSIS — R32 Unspecified urinary incontinence: Secondary | ICD-10-CM | POA: Diagnosis not present

## 2015-11-23 DIAGNOSIS — Z7982 Long term (current) use of aspirin: Secondary | ICD-10-CM | POA: Diagnosis not present

## 2015-11-23 DIAGNOSIS — E669 Obesity, unspecified: Secondary | ICD-10-CM | POA: Diagnosis not present

## 2015-11-23 DIAGNOSIS — I130 Hypertensive heart and chronic kidney disease with heart failure and stage 1 through stage 4 chronic kidney disease, or unspecified chronic kidney disease: Secondary | ICD-10-CM | POA: Diagnosis not present

## 2015-11-23 DIAGNOSIS — I872 Venous insufficiency (chronic) (peripheral): Secondary | ICD-10-CM | POA: Diagnosis not present

## 2015-11-23 DIAGNOSIS — E039 Hypothyroidism, unspecified: Secondary | ICD-10-CM | POA: Diagnosis not present

## 2015-11-23 DIAGNOSIS — I2510 Atherosclerotic heart disease of native coronary artery without angina pectoris: Secondary | ICD-10-CM | POA: Diagnosis not present

## 2015-11-23 DIAGNOSIS — I5033 Acute on chronic diastolic (congestive) heart failure: Secondary | ICD-10-CM | POA: Diagnosis not present

## 2015-11-23 DIAGNOSIS — D649 Anemia, unspecified: Secondary | ICD-10-CM | POA: Diagnosis not present

## 2015-11-23 DIAGNOSIS — E1122 Type 2 diabetes mellitus with diabetic chronic kidney disease: Secondary | ICD-10-CM | POA: Diagnosis not present

## 2015-11-23 DIAGNOSIS — F419 Anxiety disorder, unspecified: Secondary | ICD-10-CM | POA: Diagnosis not present

## 2015-11-23 DIAGNOSIS — N183 Chronic kidney disease, stage 3 (moderate): Secondary | ICD-10-CM | POA: Diagnosis not present

## 2015-11-23 DIAGNOSIS — Z7984 Long term (current) use of oral hypoglycemic drugs: Secondary | ICD-10-CM | POA: Diagnosis not present

## 2015-11-23 DIAGNOSIS — M150 Primary generalized (osteo)arthritis: Secondary | ICD-10-CM | POA: Diagnosis not present

## 2015-11-23 DIAGNOSIS — Z7951 Long term (current) use of inhaled steroids: Secondary | ICD-10-CM | POA: Diagnosis not present

## 2015-11-23 DIAGNOSIS — E1165 Type 2 diabetes mellitus with hyperglycemia: Secondary | ICD-10-CM | POA: Diagnosis not present

## 2015-11-23 DIAGNOSIS — F329 Major depressive disorder, single episode, unspecified: Secondary | ICD-10-CM | POA: Diagnosis not present

## 2015-11-23 NOTE — Patient Outreach (Signed)
[place] [facility]) Care Management [place] Telephone Outreach, Transition of Care day 26 11/23/2015  [name] 02-19-1951 [id number]  Successful telephone outreach with Susan [name], a 65 y.o. female previously followed by [place] for for self-management of chronic disease states of CHF and DM.Patient had previous hospital visit IP hospital visit in February 2017, and was readmitted June 1-3, 2017 for AMS, hypothermia, and hypoglycemia.  Today, Susan [name] returned my phone calls from earlier this week, and reports that she is continuing to be "doing better."  Susan [name] reports that she had her "last home health [facility]) nurse visit this morning," and reports that she has been discharged from [facility] as of today.  Susan [name] stated that she has not had [facility] come by, as she did not think it would help her, because her "knees are so bad," reporting that when she saw [facility] PT last, "there was no change in my walking."  Susan [name] states that she continues to use her walker when needed at home, stating that "I mainly use it at night."  Susan [name] stated today that she has not experienced any recent falls.  Susan [name] reports today that she has continued following the instructions of her endocrinologist, Dr. [name] to eliminate her evening dose of insulin and states that she has experienced "only one" episode of low blood sugar, "one night this week at 9:00 pm."  Susan [name] reports that her blood sugar fell to "the 40's," and was corrected by eating peanut butter sandwich and drinking orange juice.  Susan [name] states that she called Dr. [name] to report this episode, as she was instructed, and "hasn't heard back from them yet."  Susan [name] again reports that Dr. [name] is also working with her on a low carb diet, but states that her "sugars have been running high anyway."  Susan [name] reported that she has maintained follow up contact on her blood sugar levels with Dr. [name].Susan [name] states that she has continued monitoring and recording her  blood sugars 3-4 times daily.  Susan [name] also reports that she has attended all other provider appointments to her PCP and to the Heart[facility] Vascular clinic. She reports that she has continued to monitor and record her daily weight, stating "I'm staying right at 283 pounds." I questioned [name] why Dr. Doren Custard had the impresson that she had not been monitoring/ recording her daily weights, as noted upon review of PCP notes in EMR, as I have visualized patient's recorded weight logs on each [place] home visit.  Susan [name] stated that she "had no idea," but did state that she has not taken her weight records to her PCP visits with her.  I encouraged the patient to begin taking her recorded weight logs with her for each of her provider appointments, and she agreed to do so.  Susan [name] continues to report that her legs "look better."    Susan [name] denies further concerns, needs, questions, or problems today, and I reinforced her overall plan of care with her, which she was agreeable to. During our phone conversation today, we scheduled next [place] in-home visit.  Plan:  Susan [name] will take all of her medications as they are prescribed and will keep all scheduled provider appointments.  Susan [name] will call EMS for any urgent or emergent issues or concerns she experiences. Susan [name] will contact her care/ medical providers for any questions, concerns, issues, or problems that she experiences prior to scheduled office visits.  Susan [name] will continue to monitor and record her weight daily and will  take these values to her providers appointments with her.  Susan [name] will continue using her walker as needed.  [place] outreach to continue with home visit scheduled next week  [name], RN, BSN, [place] Care Management  [phone]

## 2015-11-23 NOTE — Patient Outreach (Addendum)
[place] [name]) Care Management [place] Telephone Outreach, Transition of Care, day 26 11/23/2015  Susan [name] Jan 18, 1951 [id number]   Unsuccessful telephone outreach attempt to [name], a 65 y.o. female previously followed by [place] for for self-management of chronic disease states of CHF and DM.Patient had previous IP hospital visit in February 2017, and was readmitted June 1-3, 2017 for AMS, hypothermia, and hypoglycemia. HIPAA compliant voice mail message left for patient, asking her to return my call.  Plan:  Will re-attempt [place] telephone outreach for continued transition of care scheduled for next week, if I do not hear back from patient this afternoon.  [name], RN, BSN, [employer] [facility] Care Management  [phone]

## 2015-11-26 ENCOUNTER — Ambulatory Visit: Payer: Self-pay | Admitting: *Deleted

## 2015-11-30 ENCOUNTER — Other Ambulatory Visit: Payer: Self-pay | Admitting: *Deleted

## 2015-11-30 ENCOUNTER — Encounter: Payer: Self-pay | Admitting: *Deleted

## 2015-11-30 NOTE — Patient Outreach (Signed)
[place] [facility]) Care Management  [place] Routine Home Visit 11/30/2015  Susan [name] [birth year] [id number]  Susan [name] is an 65 y.o. female previously followed by [place] for for self-management of chronic disease states of CHF and DM. Patient had previous hospital visit IP hospital visit in February 2017, and was readmitted June 1-3, 2017 for AMS, hypothermia, and hypoglycemia.  Today, Susan [name] states that she believes she is "doing better every day."  She reports attending all scheduled provider appointments and taking her medications as they are prescribed. We reviewed all of Susan [name]'s medications and she again verbalized a good understanding of the purpose, scheduling and dosing of her medications.  Susan [name] again reports that home health services stopped this week.  Susan [name] reports that she will be having "a laser eye procedure" on December 04, 2015 "to correct leakage behind my retina."  Diabetes:  Susan [name] reports last office visit with Dr. [name] on November 12, 2015, at which time her insulin was re-adjusted.  Susan [name] reported that she is now taking Novolin 70/30, 45 units every morning, and 35 units every afternoon with no evening insulin; Susan [name] reports this has been "much better," and reports only isolated extreme highs/ lows of her blood sugars.  Susan [name]'s recorded blood sugars post-hospital discharge were reviewed today and were noted to have generally ranged from 91-350, with one isolated low value of 40 one evening, and a few isolated high values between 400-450.  Susan [name] reports no episodes of hypoglycemia that have caused her LOC or AMS since her discharge home from the hospital on October 27, 2015.  Susan [name] again reported that she "doesn't know how to eat" to better self-manage her DM.  We discussed resources available for her such as the Nutrition and Diabetes Management [place], and I provided her with the support group information, phone number and directions to the center, and Susan [name]  agreed to call to obtain more information on their services.  I also provided Susan [name] with several educational booklets for diabetes self-management which we reviewed together today.  CHF:  Susan [name] has continued to monitor and record her daily weight, and I visualized/ reviewed her personal weight records dating back to February 2017.  Weight range since last hospital discharge on October 27, 2015 has been 282-285 lbs.  Today's weight:  284.4 lbs.  We extensively went over CHF zones, and I quizzed Susan [name] on action plan for each zone, and she accurately reported appropriate action plan for each zone.  Next appointment at [facility]: December 27, 2015. Susan [name] verbalizes that she is not sure if she should resume her metolazone as needed, nor how to care for her legs "when they start looking bad and get oozy."  I advised to discuss with Darrick Grinder, NP at scheduled OV 12/27/15.  Falls:  Susan [name] reports no falls since her last discharge from the hospital on October 27, 2015.  Susan [name] stated that her walking has improved, however reported that she keeps her walker "nearby, just in case," and by her bed every night.  Susan [name]'s ambulation appears improved since last [facility] CM in home visit on October 30, 2015.  Susan [name] is actively working with Susan [name] to have grab bars installed in her bathroom toilet and shower areas.   Subjective: "I am really trying to do what I am supposed to do and to stay out of the hospital."  Objective:    BP 128/64 mmHg  Pulse 68  Resp 16  Wt 284 lb 6.4 oz (  129.003 kg)  SpO2 98%   Review of Systems  Constitutional: Negative.  Negative for fever and weight loss.  Respiratory: Negative for cough, sputum production, shortness of breath and wheezing.   Cardiovascular: Positive for leg swelling. Negative for chest pain.       Patient reports her bilateral leg swelling is at her baseline today.  On exam, (R) LE has +1-2 edema and (L) LE has +2-3; skin on lower extremities is erythematous/ red and  thickened, with no active oozing noted from either leg.  Legs appear to be at baseline from prior [place] home visits.  Gastrointestinal: Negative.  Negative for nausea and abdominal pain.  Genitourinary: Positive for frequency.       Patient attributes to diuretics  Musculoskeletal: Negative for myalgias, back pain and falls.  Neurological: Negative.  Negative for weakness.  Psychiatric/Behavioral: Positive for depression. The patient is not nervous/anxious.        Patient reports recently re-started on paxil for ongoing  Depression and states this "is helping."    Physical Exam  Constitutional: She is oriented to person, place, and time. She appears well-developed and well-nourished. No distress.  Cardiovascular: Normal rate, regular rhythm, normal heart sounds and intact distal pulses.   Pulses:      Radial pulses are 2+ on the right side, and 2+ on the left side.       Dorsalis pedis pulses are 1+ on the right side, and 1+ on the left side.  Heart sounds distant secondary to obesity  Respiratory: Effort normal and breath sounds normal. No respiratory distress. She has no wheezes. She has no rales.  Patient noted to be minimally short of breath when she walks around her home and recovers quickly (within 2-3 seconds)when she returns to chair; no shortness of breath noted at rest.  Patient described this as her "normal"/ baseline  GI: Soft. Bowel sounds are normal.  Musculoskeletal: She exhibits edema.  See ROS  Neurological: She is alert and oriented to person, place, and time.  Skin: Skin is warm and dry. There is erythema.  Bilateral LE, without oozing noted; skin is W/D to touch.  See ROS  Psychiatric: She has a normal mood and affect. Her behavior is normal. Judgment and thought content normal.    Encounter Medications:   Outpatient Encounter Prescriptions as of 11/30/2015  Medication Sig Note  . aspirin EC 81 MG tablet Take 81 mg by mouth daily.   . carvedilol (COREG)  12.5 MG tablet Take 1.5 tablets (18.75 mg total) by mouth 2 (two) times daily with a meal.   . clonazePAM (KLONOPIN) 1 MG tablet Take 1 tablet (1 mg total) by mouth 3 (three) times daily.   . clopidogrel (PLAVIX) 75 MG tablet TAKE 1 TABLET (75 MG TOTAL) BY MOUTH DAILY WITH BREAKFAST.   . ferrous sulfate 325 (65 FE) MG tablet Take 325 mg by mouth daily with breakfast. Reported on 08/21/2015 09/27/2015: Taking non prescription form   . insulin NPH-regular Human (NOVOLIN 70/30) (70-30) 100 UNIT/ML injection Inject 35-45 Units into the skin 2 (two) times daily. 45 units in the morning and 35 units at lunch (no evening doses) 11/30/2015: Dosage updated around patient report of changes made at last OV with Dr. [name], endocrinologist   . isosorbide dinitrate (ISORDIL) 10 MG tablet TAKE 1 TABLET (10 MG TOTAL) BY MOUTH 2 (TWO) TIMES DAILY.   Marland Kitchen levothyroxine (SYNTHROID, LEVOTHROID) 75 MCG tablet Take 1 tablet (75 mcg total) by mouth daily  before breakfast.   . losartan (COZAAR) 50 MG tablet TAKE 1 TABLET BY MOUTH DAILY   . metFORMIN (GLUMETZA) 500 MG (MOD) 24 hr tablet Take 500 mg by mouth 2 (two) times daily with a meal.   . nitroGLYCERIN (NITROSTAT) 0.4 MG SL tablet Place 1 tablet (0.4 mg total) under the tongue every 5 (five) minutes as needed for chest pain. 09/27/2015: Takes as needed, has not needed recently   . PARoxetine (PAXIL-CR) 25 MG 24 hr tablet Take 1 tablet (25 mg total) by mouth daily.   . potassium chloride SA (K-DUR,KLOR-CON) 20 MEQ tablet Take 20 mEq by mouth 2 (two) times daily.   . pregabalin (LYRICA) 150 MG capsule Take 150 mg by mouth 3 (three) times daily. Reported on 10/31/2015   . torsemide (DEMADEX) 20 MG tablet Take 4 tablets (80 mg total) by mouth daily.   Marland Kitchen albuterol (PROVENTIL HFA;VENTOLIN HFA) 108 (90 BASE) MCG/ACT inhaler Inhale 2 puffs into the lungs every 6 (six) hours as needed for wheezing or shortness of breath. Reported on 10/30/2015    No facility-administered encounter medications  on file as of 11/30/2015.    Functional Status:   In your present state of health, do you have any difficulty performing the following activities: 10/30/2015 10/25/2015  Hearing? N N  Vision? N N  Difficulty concentrating or making decisions? N N  Walking or climbing stairs? Y Y  Dressing or bathing? Y Y  Doing errands, shopping? N N  Preparing Food and eating ? N -  Using the Toilet? N -  In the past six months, have you accidently leaked urine? Y -  Do you have problems with loss of bowel control? N -  Managing your Medications? N -  Managing your Finances? N -  Housekeeping or managing your Housekeeping? N -    Fall/Depression Screening:    PHQ 2/9 Scores 10/19/2015 08/09/2015 08/07/2015 03/16/2015  PHQ - 2 Score 4 4 4  0  PHQ- 9 Score 16 16 16  0    Assessment:  Susan [name]'s blood sugars appear to have stabilized on her current insulin dosing, and she reports that she feels better.   Susan [name] has continued to monitor and record her daily blood sugars and weights, and has improved on keeping her providers informed when she experiences urgent/ emergent problems or has questions about her care.  Although Susan [name] remains at risk for falls, she has taken steps to reduce this risk.Susan [name] would like to continue to learn more about self-management of both her diabetes and CHF.    Plan:  Susan [name] will continue to take all of her medications as they are prescribed and will keep all scheduled provider appointments.  Susan [name] will call EMS for any urgent or emergent issues or concerns she experiences.  Susan [name] will contact her care/ medical providers for any questions, concerns, issues, or problems that she experiences prior to scheduled office visits.  Susan [name] will continue to monitor and record her daily weight and blood sugars, as she has been, and will take these values to ALL provider appointments with her.  Susan [name] contact the [facility] to inquire about Diabetes Support group and possible options to  meet with a Diabetes educator for one-one consultation.  Susan [name] will contact Dr. [name] to obtain next follow up visit.  Susan [name] will continue using her walker as needed.  [place] outreach to continue with home visit scheduled for next month.  [name], RN, BSN, CCRN [employer] [facility]  Care Management  [phone]

## 2015-12-04 DIAGNOSIS — E113391 Type 2 diabetes mellitus with moderate nonproliferative diabetic retinopathy without macular edema, right eye: Secondary | ICD-10-CM | POA: Diagnosis not present

## 2015-12-04 DIAGNOSIS — H3582 Retinal ischemia: Secondary | ICD-10-CM | POA: Diagnosis not present

## 2015-12-04 DIAGNOSIS — E113312 Type 2 diabetes mellitus with moderate nonproliferative diabetic retinopathy with macular edema, left eye: Secondary | ICD-10-CM | POA: Diagnosis not present

## 2015-12-04 DIAGNOSIS — H43813 Vitreous degeneration, bilateral: Secondary | ICD-10-CM | POA: Diagnosis not present

## 2015-12-11 ENCOUNTER — Other Ambulatory Visit: Payer: Self-pay | Admitting: Physician Assistant

## 2015-12-11 DIAGNOSIS — I10 Essential (primary) hypertension: Secondary | ICD-10-CM | POA: Diagnosis not present

## 2015-12-11 DIAGNOSIS — R809 Proteinuria, unspecified: Secondary | ICD-10-CM | POA: Diagnosis not present

## 2015-12-11 DIAGNOSIS — E1165 Type 2 diabetes mellitus with hyperglycemia: Secondary | ICD-10-CM | POA: Diagnosis not present

## 2015-12-11 DIAGNOSIS — G609 Hereditary and idiopathic neuropathy, unspecified: Secondary | ICD-10-CM | POA: Diagnosis not present

## 2015-12-18 ENCOUNTER — Other Ambulatory Visit: Payer: Self-pay | Admitting: Licensed Clinical Social Worker

## 2015-12-18 NOTE — Patient Outreach (Signed)
Assessment:  CSW spoke via phone with client on 12/18/15. CSW verified client identity. CSW and client spoke of client needs. Client said she has her prescribed medications and takes medications as prescribed. She said she drives herself to her scheduled medical appointments. CSW and client spoke of client care plan. CSW encouraged client to attend all scheduled client medical appointments in the next 30 days. She said she sometimes fatigues easily. She said that the current heat outdoors is difficult for client. She said she can get short of breath occasionally. She said she can complete short community errands but has to rest at home afterwards.  She said she sometimes has swelling in her ankles and feet.  She said she sees [name], Designer, jewellery, at [employer], for her medical care. She said she has appointment with [name] on 12/19/15.  Her spouse is a Administrator and is sometimes away from home with his job. [facility] [place] as scheduled for care.  Client said she is scheduled for [facility] on December 27, 2015. Client said she receives [place] support with Reginia Naas, RN.   Client said she has pet dogs and enjoys spending time with her pet dogs for relaxation.  Client was hospitalized for care in June of 2017. She discharged from the hospital on October 27, 2015 to return to her home. She said she has had no recent falls.  She sees Dr. Soyla Murphy for diabetes management.  She said she has to monitor her energy level and stay cool during hot weather days and has to limit her time outdoors when weather is hot. CSW encouraged client to call CSW at [phone] as needed to discuss social work needs of client. CSW thanked client for phone call with CSW on 12/18/15.    Plan:  Client to attend all scheduled client medical appointments in the next 30 days. CSW to collaborate with RN Reginia Naas to  monitor client needs. CSW to call client in 4 weeks to assess client needs at that time.  [name].Maryem Shuffler MSW, LCSW Licensed Clinical Social Worker [facility] Care Management [phone]

## 2015-12-19 ENCOUNTER — Ambulatory Visit (INDEPENDENT_AMBULATORY_CARE_PROVIDER_SITE_OTHER): Payer: PPO | Admitting: Physician Assistant

## 2015-12-19 ENCOUNTER — Encounter: Payer: Self-pay | Admitting: Physician Assistant

## 2015-12-19 VITALS — BP 124/80 | HR 76 | Temp 97.8°F | Resp 18 | Wt 282.0 lb

## 2015-12-19 DIAGNOSIS — F411 Generalized anxiety disorder: Secondary | ICD-10-CM

## 2015-12-19 DIAGNOSIS — F329 Major depressive disorder, single episode, unspecified: Secondary | ICD-10-CM

## 2015-12-19 DIAGNOSIS — F419 Anxiety disorder, unspecified: Secondary | ICD-10-CM

## 2015-12-19 DIAGNOSIS — F418 Other specified anxiety disorders: Secondary | ICD-10-CM | POA: Diagnosis not present

## 2015-12-19 DIAGNOSIS — F32A Depression, unspecified: Secondary | ICD-10-CM

## 2015-12-19 MED ORDER — PAROXETINE HCL ER 37.5 MG PO TB24
37.5000 mg | ORAL_TABLET | Freq: Every day | ORAL | 5 refills | Status: DC
Start: 2015-12-19 — End: 2016-03-06

## 2015-12-19 NOTE — Progress Notes (Signed)
Patient ID: Skylor Pinn MRN: [id number], DOB: Dec 01, 1950, 65 y.o. Date of Encounter: @DATE @  Chief Complaint:  Chief Complaint  Patient presents with  . Chronic Kidney Disease    6 week follow up  . Diabetes    HPI: 65 y.o. year old female  presents with above.   At [place] 11/05/15 she reported that she was feeling that she needed to get back on her Paxil. Said that she was crying easily and felt that she needed to get back on the Paxil. At that visit I prescribed Paxil 20 mg daily. She called on June 15 and reported that it was causing nausea. At that time, changed her to Paxil CR 25 mg daily. Reviewed  that she had been on the Paxil CR and that had caused no nausea or other adverse effects.  Today she reports that she is taking the Paxil CR 25 mg daily. No adverse effects. She says that she can tell that it is helping and she does feel some better. However does think that she would benefit from further increasing the dose.  Also reports that a nurse has been coming to her house once a month. Wanted to let me know that she is checking her weight daily now. Will continue to follow up with the heart failure clinic.    Past Medical History:  Diagnosis Date  . Acute MI ([place]) 1999; 2007  . Anemia    hx  . Anginal pain ([name])   . Anxiety   . Arthritis    "generalized" (03/15/2014)  . CAD (coronary artery disease)    MI in 2000 - MI  2007 - treated bare metal stent (no nuclear since then as 9/11)  . Carotid artery disease ([place])   . CHF (congestive heart failure) ([place])   . Chronic diastolic heart failure (HCC)    a) ECHO (08/2013) EF 55-60% and RV function nl b) RHC (08/2013) RA 4, RV 30/5/7, PA 25/10 (16), PCWP 7, Fick CO/CI 6.3/2.7, PVR 1.5 WU, PA 61 and 66%  . Daily headache    "~ every other day; since I fell in June" (03/15/2014)  . Depression   . Dyslipidemia   . Exertional shortness of breath   . HTN (hypertension)   . Hypothyroidism   . Obesity   . Osteoarthritis   .  Peripheral neuropathy ([place])   . PONV (postoperative nausea and vomiting)   . RBBB (right bundle branch block)    Old  . Renal insufficiency    DENIES  . Stroke [facility])    mini strokes  . Syncope    likely due to low blood sugar  . Tachycardia    Sinus tachycardia  . Type II diabetes mellitus ([place])   . Urinary incontinence   . Venous insufficiency      Home Meds: Outpatient Medications Prior to Visit  Medication Sig Dispense Refill  . albuterol (PROVENTIL HFA;VENTOLIN HFA) 108 (90 BASE) MCG/ACT inhaler Inhale 2 puffs into the lungs every 6 (six) hours as needed for wheezing or shortness of breath. Reported on 10/30/2015    . aspirin EC 81 MG tablet Take 81 mg by mouth daily.    . carvedilol (COREG) 12.5 MG tablet Take 1.5 tablets (18.75 mg total) by mouth 2 (two) times daily with a meal. 90 tablet 6  . clonazePAM (KLONOPIN) 1 MG tablet TAKE 1 TABLET BY MOUTH 3 TIMES A DAY 90 tablet 1  . clopidogrel (PLAVIX) 75 MG tablet TAKE 1 TABLET (75  MG TOTAL) BY MOUTH DAILY WITH BREAKFAST. 90 tablet 2  . ferrous sulfate 325 (65 FE) MG tablet Take 325 mg by mouth daily with breakfast. Reported on 08/21/2015    . insulin NPH-regular Human (NOVOLIN 70/30) (70-30) 100 UNIT/ML injection Inject 35-45 Units into the skin 2 (two) times daily. 45 units in the morning and 35 units at lunch (no evening doses)    . isosorbide dinitrate (ISORDIL) 10 MG tablet TAKE 1 TABLET (10 MG TOTAL) BY MOUTH 2 (TWO) TIMES DAILY. 60 tablet 10  . levothyroxine (SYNTHROID, LEVOTHROID) 75 MCG tablet Take 1 tablet (75 mcg total) by mouth daily before breakfast. 30 tablet 3  . losartan (COZAAR) 50 MG tablet TAKE 1 TABLET BY MOUTH DAILY 90 tablet 1  . metFORMIN (GLUMETZA) 500 MG (MOD) 24 hr tablet Take 500 mg by mouth 2 (two) times daily with a meal.    . nitroGLYCERIN (NITROSTAT) 0.4 MG SL tablet Place 1 tablet (0.4 mg total) under the tongue every 5 (five) minutes as needed for chest pain. 25 tablet 3  . potassium chloride SA  (K-DUR,KLOR-CON) 20 MEQ tablet Take 20 mEq by mouth 2 (two) times daily.    . pregabalin (LYRICA) 150 MG capsule Take 150 mg by mouth 3 (three) times daily. Reported on 10/31/2015    . torsemide (DEMADEX) 20 MG tablet Take 4 tablets (80 mg total) by mouth daily. 240 tablet 2  . PARoxetine (PAXIL-CR) 25 MG 24 hr tablet Take 1 tablet (25 mg total) by mouth daily. 30 tablet 1   No facility-administered medications prior to visit.     Allergies:  Allergies  Allergen Reactions  . Codeine Nausea And Vomiting    Social History   Social History  . Marital status: Married    Spouse name: N/A  . Number of children: 3  . Years of education: 12th   Occupational History  .  Unemployed    unemploye   Social History Main Topics  . Smoking status: Former Smoker    Packs/day: 3.00    Years: 32.00    Types: Cigarettes    Quit date: 10/24/1997  . Smokeless tobacco: Never Used  . Alcohol use Yes     Comment: "might have 2-3 daiquiris in the summer"  . Drug use: No  . Sexual activity: No   Other Topics Concern  . Not on file   Social History Narrative   Pt lives at home with her spouse.   Caffeine Use- 3 sodas daily    Family History  Problem Relation Age of Onset  . Coronary artery disease    . Heart attack Mother      Review of Systems:  See HPI for pertinent ROS. All other ROS negative.    Physical Exam: Blood pressure 124/80, pulse 76, temperature 97.8 F (36.6 C), temperature source Oral, resp. rate 18, weight 282 lb (127.9 kg)., Body mass index is 45.52 kg/m. General: Obese WF. Appears in no acute distress. Neck: Supple. No thyromegaly. No lymphadenopathy. Lungs: Clear bilaterally to auscultation without wheezes, rales, or rhonchi. Breathing is unlabored. Heart: RRR with S1 S2. No murmurs, rubs, or gallops. Musculoskeletal:  Strength and tone normal for age. Neuro: Alert and oriented X 3. Moves all extremities spontaneously. Gait is normal. CNII-XII grossly in  tact. Psych:  Responds to questions appropriately with a normal affect.     ASSESSMENT AND PLAN:  65 y.o. year old female with   Anxiety and depression 1. Generalized anxiety disorder - PARoxetine (  PAXIL CR) 37.5 MG 24 hr tablet; Take 1 tablet (37.5 mg total) by mouth daily.  Dispense: 30 tablet; Refill: 5 Will increase the Paxil CR to 37.5 mg daily. She can wait 6 months for follow-up office visit if things remain stable. She is seeing the heart failure clinic and is seeing Dr. Soyla Murphy regarding her diabetes.  Marin Olp [place], [place], [facility] 12/19/2015 2:05 PM

## 2015-12-27 ENCOUNTER — Ambulatory Visit (HOSPITAL_COMMUNITY)
Admission: RE | Admit: 2015-12-27 | Discharge: 2015-12-27 | Disposition: A | Discharge status: Home / Self Care | Payer: PPO | Source: Ambulatory Visit | Attending: Internal Medicine | Admitting: Internal Medicine

## 2015-12-27 VITALS — BP 138/58 | HR 75 | Wt 285.2 lb

## 2015-12-27 DIAGNOSIS — R0600 Dyspnea, unspecified: Secondary | ICD-10-CM | POA: Insufficient documentation

## 2015-12-27 DIAGNOSIS — I130 Hypertensive heart and chronic kidney disease with heart failure and stage 1 through stage 4 chronic kidney disease, or unspecified chronic kidney disease: Secondary | ICD-10-CM | POA: Insufficient documentation

## 2015-12-27 DIAGNOSIS — E669 Obesity, unspecified: Secondary | ICD-10-CM | POA: Diagnosis not present

## 2015-12-27 DIAGNOSIS — E039 Hypothyroidism, unspecified: Secondary | ICD-10-CM | POA: Diagnosis not present

## 2015-12-27 DIAGNOSIS — Z8249 Family history of ischemic heart disease and other diseases of the circulatory system: Secondary | ICD-10-CM | POA: Insufficient documentation

## 2015-12-27 DIAGNOSIS — I5032 Chronic diastolic (congestive) heart failure: Secondary | ICD-10-CM | POA: Diagnosis not present

## 2015-12-27 DIAGNOSIS — Z87891 Personal history of nicotine dependence: Secondary | ICD-10-CM | POA: Diagnosis not present

## 2015-12-27 DIAGNOSIS — I252 Old myocardial infarction: Secondary | ICD-10-CM | POA: Diagnosis not present

## 2015-12-27 DIAGNOSIS — Z6841 Body Mass Index (BMI) 40.0 and over, adult: Secondary | ICD-10-CM | POA: Insufficient documentation

## 2015-12-27 DIAGNOSIS — Z7982 Long term (current) use of aspirin: Secondary | ICD-10-CM | POA: Diagnosis not present

## 2015-12-27 DIAGNOSIS — Z794 Long term (current) use of insulin: Secondary | ICD-10-CM | POA: Insufficient documentation

## 2015-12-27 DIAGNOSIS — I872 Venous insufficiency (chronic) (peripheral): Secondary | ICD-10-CM | POA: Diagnosis not present

## 2015-12-27 DIAGNOSIS — E1142 Type 2 diabetes mellitus with diabetic polyneuropathy: Secondary | ICD-10-CM | POA: Diagnosis not present

## 2015-12-27 DIAGNOSIS — Z7902 Long term (current) use of antithrombotics/antiplatelets: Secondary | ICD-10-CM | POA: Diagnosis not present

## 2015-12-27 DIAGNOSIS — E785 Hyperlipidemia, unspecified: Secondary | ICD-10-CM | POA: Insufficient documentation

## 2015-12-27 DIAGNOSIS — I2510 Atherosclerotic heart disease of native coronary artery without angina pectoris: Secondary | ICD-10-CM | POA: Insufficient documentation

## 2015-12-27 DIAGNOSIS — E1122 Type 2 diabetes mellitus with diabetic chronic kidney disease: Secondary | ICD-10-CM | POA: Insufficient documentation

## 2015-12-27 DIAGNOSIS — Z79899 Other long term (current) drug therapy: Secondary | ICD-10-CM | POA: Diagnosis not present

## 2015-12-27 DIAGNOSIS — Z8673 Personal history of transient ischemic attack (TIA), and cerebral infarction without residual deficits: Secondary | ICD-10-CM | POA: Insufficient documentation

## 2015-12-27 DIAGNOSIS — N183 Chronic kidney disease, stage 3 (moderate): Secondary | ICD-10-CM | POA: Diagnosis not present

## 2015-12-27 NOTE — Patient Instructions (Signed)
Your physician recommends that you schedule a follow-up appointment in: 8 weeks  Do the following things EVERYDAY: 1) Weigh yourself in the morning before breakfast. Write it down and keep it in a log. 2) Take your medicines as prescribed 3) Eat low salt foods-Limit salt (sodium) to 2000 mg per day.  4) Stay as active as you can everyday 5) Limit all fluids for the day to less than 2 liters

## 2015-12-27 NOTE — Progress Notes (Signed)
Patient ID: Susan [name], female   DOB: 10-15-1950, 65 y.o.   MRN: [id number]   PCP: Dr Edilia Bo  Cardiologist: Dr Myrtis Ser  Endocrinologist: Dr Lurene Shadow  Primary HF Cardiologist: Dr Leory Plowman   Susan [name]: Susan [name] is a 65 year old with a history of RBBB, DM, Hypothyroid, diastolic heart failure, CAD MI 2000/2007 BMS 2007, TIA, obesity.   Admitted 06/07/15 with N,V, hyperglycemia. Creatinine was up to 1.9. On the day discharge creatinine was back down to 1.2 Thought to be dehydrated so diuretics were held. Diuretics restarted on the day discharge. Discharge weight 282 pounds.   Admitted 2/13 through 07/13/15 with gastroenteritis and hyper glycemia. Diuretic initially held due to dehydration. Losartan and metolazone stopped with AKI/CKD. Discharge weight was 283 pounds.   Admitted June 1st through June 3rd with altered mental status, hypothermia, and weakness. Diuretics initially held and later restarted. Unfortunately when she was discharged she refused home health.  Today she returns for HF follow up. Overall feeling ok. Breathing at her baseline. Remains SOB with exertion. Weight at home 280-287 pounds. Not following low salt diet. Drinking > 2 liters of fluid per day. Taking all medications. Still having trouble with glucose control.   Followed by Dr Lurene Shadow.   ECHO 07/11/2015- 50-55%.  Grade I DD  RHC 09/22/13: RA = 4  RV = 30/5/7  PA = 25/10 (16)  PCW = 7  Fick cardiac output/index = 6.3/2.7  PVR = 1.5 WU   FA sat = 93%  PA sat = 61%, 66%   Labs: 07/18/13 K 4.8 Creatinine 1.38 Glucose 1005 she refused to go to [facility] Labs : 08/17/13 K 3.9 Creatinine 1.4 Glucose 499 Labs: 09/29/13 K 4.5 Creatinine 1.4  Labs 04/18/14 K 4.4 Creatinine 1.18 Labs 03/26/2015: K 5.3 Creatinine 1.41  Labs 05/01/2015: K 4.9 Creatinine 1.8 Labs 05/04/2015: K 5.0 Creatinine 1.74  Labs 06/10/2015: K 4.7 Creatinine 1.21 Labs 06/12/2015: K 6.3 Creatinine 2.01  Labs 06/19/2015: K 4.5 Creatinine 1.29  Labs  07/10/2015: Hgb A1C 9.5  Labs 07/12/2015: K 4.4 Creatinine 1.62 K 4.4  Labs 08/10/2015: K 5.3 Creatinine 1.44  Labs 10/03/2015: K 4.5 Creatinine 1.41  Labs 10/19/15: K 4.7 creatinine 1.75  Labs 10/28/2015: K 4.5 Creatinine 1.2    SH: Former smoker quit 15 year ago. Does drink alcohol. She is disabled. Lives at home with her husband and disabled son - Husband is truck [employer] doctor. Son has a brain injury after he attempted suicide 2 years ago.    FH: Mom MI  ROS: All systems negative except as listed in Susan [name], PMH and Problem List.  Past Medical History:  Diagnosis Date  . Acute MI (HCC) 1999; 2007  . Anemia    hx  . Anginal pain (HCC)   . Anxiety   . Arthritis    "generalized" (03/15/2014)  . CAD (coronary artery disease)    MI in 2000 - MI  2007 - treated bare metal stent (no nuclear since then as 9/11)  . Carotid artery disease (HCC)   . CHF (congestive heart failure) (HCC)   . Chronic diastolic heart failure (HCC)    a) ECHO (08/2013) EF 55-60% and RV function nl b) RHC (08/2013) RA 4, RV 30/5/7, PA 25/10 (16), PCWP 7, Fick CO/CI 6.3/2.7, PVR 1.5 WU, PA 61 and 66%  . Daily headache    "~ every other day; since I fell in June" (03/15/2014)  . Depression   . Dyslipidemia   . Exertional shortness of breath   .  HTN (hypertension)   . Hypothyroidism   . Obesity   . Osteoarthritis   . Peripheral neuropathy (HCC)   . PONV (postoperative nausea and vomiting)   . RBBB (right bundle branch block)    Old  . Renal insufficiency    DENIES  . Stroke [facility])    mini strokes  . Syncope    likely due to low blood sugar  . Tachycardia    Sinus tachycardia  . Type II diabetes mellitus (HCC)   . Urinary incontinence   . Venous insufficiency     Current Outpatient Prescriptions  Medication Sig Dispense Refill  . albuterol (PROVENTIL HFA;VENTOLIN HFA) 108 (90 BASE) MCG/ACT inhaler Inhale 2 puffs into the lungs every 6 (six) hours as needed for wheezing or shortness of breath. Reported on 10/30/2015     . aspirin EC 81 MG tablet Take 81 mg by mouth daily.    . carvedilol (COREG) 12.5 MG tablet Take 1.5 tablets (18.75 mg total) by mouth 2 (two) times daily with a meal. 90 tablet 6  . clopidogrel (PLAVIX) 75 MG tablet TAKE 1 TABLET (75 MG TOTAL) BY MOUTH DAILY WITH BREAKFAST. 90 tablet 2  . ferrous sulfate 325 (65 FE) MG tablet Take 325 mg by mouth daily with breakfast. Reported on 08/21/2015    . insulin NPH-regular Human (NOVOLIN 70/30) (70-30) 100 UNIT/ML injection Inject 35-45 Units into the skin 2 (two) times daily. 45 units in the morning and 35 units at lunch (no evening doses)    . isosorbide dinitrate (ISORDIL) 10 MG tablet TAKE 1 TABLET (10 MG TOTAL) BY MOUTH 2 (TWO) TIMES DAILY. 60 tablet 10  . levothyroxine (SYNTHROID, LEVOTHROID) 50 MCG tablet Take 50 mcg by mouth daily before breakfast.    . losartan (COZAAR) 50 MG tablet TAKE 1 TABLET BY MOUTH DAILY 90 tablet 1  . metFORMIN (GLUMETZA) 500 MG (MOD) 24 hr tablet Take 500 mg by mouth 2 (two) times daily with a meal.    . nitroGLYCERIN (NITROSTAT) 0.4 MG SL tablet Place 1 tablet (0.4 mg total) under the tongue every 5 (five) minutes as needed for chest pain. 25 tablet 3  . ONETOUCH VERIO test strip     . PARoxetine (PAXIL CR) 37.5 MG 24 hr tablet Take 1 tablet (37.5 mg total) by mouth daily. 30 tablet 5  . potassium chloride SA (K-DUR,KLOR-CON) 20 MEQ tablet Take 20 mEq by mouth 2 (two) times daily.    . pregabalin (LYRICA) 150 MG capsule Take 150 mg by mouth 3 (three) times daily. Reported on 10/31/2015    . torsemide (DEMADEX) 20 MG tablet Take 4 tablets (80 mg total) by mouth daily. 240 tablet 2  . clonazePAM (KLONOPIN) 1 MG tablet TAKE 1 TABLET BY MOUTH 3 TIMES A DAY 90 tablet 1   No current facility-administered medications for this encounter.     Vitals:   12/27/15 1413  BP: (!) 138/58  Pulse: 75  SpO2: 96%  Weight: 285 lb 3.2 oz (129.4 kg)   PHYSICAL EXAM: General:  Chronically ill appearing. Ambulated in the clinic  without difficulty.     HEENT: normal Neck: supple. JVP difficult to assess d/t body habitus. Does not appear elevated.     Carotids 2+ bilaterally; no bruits. No lymphadenopathy or thryomegaly appreciated. Cor: PMI normal. Regular rate & rhythm. No rubs, gallops or murmurs. Lungs: decreased in the bases.  Abdomen: obese, soft, nontender, distended. No hepatosplenomegaly. No bruits or masses. Good bowel sounds. Extremities: no cyanosis,  clubbing, rash, R and LLE trace - 1+ edema. Chronic lower extremity hyperpigmentation  Neuro: alert & orientedx3, cranial nerves grossly intact. Moves all 4 extremities w/o difficulty. Affect pleasant.     ASSESSMENT & PLAN: 1. Chronic Diastolic Heart Failure: NICM, EF 55-60% Grade II DD(06/2015 )  -NYHA III symptoms. Functionally doing ok.   Volume status ok.  Continue torsemide 80 mg daily + 20 meq potassium twice a day.    -Continue losartan 50 mg daily.    - Reinforced the need and importance of daily weights, a low sodium diet, and fluid restriction (less than 2 L a day). Instructed to call the HF clinic if weight increases more than 3 lbs overnight or 5 lbs in a week 2.. Dyspnea- She has been offered sleep study many times but refuses.  Continue inhalers as needed.   3. Uncontrolled DM:  Has follow up with Dr Lurene Shadow in October. eems to better controlled.   4. CAD- no evidence of ischemia- on aspirin and statin.  5. Obesity: Discussed portion control the importance of weight loss.  6. HTN-  Stable. Continue current regimen.     7. CKD Stage III- BMET reviewed and stable from 10/28/2015.     Follow up in 2 months.  [name] and Paramedicine.     Sabatino Williard NP-C  2:23 PM

## 2015-12-28 ENCOUNTER — Other Ambulatory Visit: Payer: Self-pay | Admitting: *Deleted

## 2015-12-28 ENCOUNTER — Encounter: Payer: Self-pay | Admitting: *Deleted

## 2015-12-28 ENCOUNTER — Other Ambulatory Visit: Payer: Self-pay | Admitting: Physician Assistant

## 2015-12-28 NOTE — Telephone Encounter (Signed)
Refill appropriate and filled per protocol. 

## 2015-12-28 NOTE — Patient Outreach (Signed)
[facility]) Care Management  [place] Routine Home Visit 12/28/2015  Susan [name] 1951/02/05 [id number]  Susan [name] is an 65 y.o. female well known to [place] for for self-management of chronic disease states of CHF and DM.    Today, Susan [name] states that she believes she is "doing better every day."  She reports attending all scheduled provider appointments and taking her medications as they are prescribed. We reviewed all of Susan [name]'s medications and she again verbalized a good understanding of the purpose, scheduling and dosing of her medications.  Susan [name] reports that the dosing of her antidepressant was increased during her last office visit with her PCP on December 19, 2015.  Together, [place] and I contacted her pharmacy to re-order this medication, as it was noted to be due for re-filling.  We have been working on several areas of self-management of chronic disease states in an effort to meet Susan [name]'s stated goals for her state of health:  Diabetes:   Susan [name] reports that she attended recent office visit with Dr. [name], at which time her insulin was again re-adjusted.  Susan [name] reported that she is now taking Novolin 70/30, 55 units every morning, and 45 units every afternoon with no evening insulin; Susan [name] reports this has been "working much better," and reports only isolated extreme highs/ lows of her blood sugars.  Susan [name] reports 2 recent isolated episodes of hypoglycemia where her blood sugars dropped to "40-60 range" that did not cause her to have decreased LOC or AMS, and was corrected by "eating a peanut butter sandwich," which her husband fixed for her.    Susan [name] reported that she contacted the [facility] [place], and obtained information about their support group offerings, which she states she would like to attend. Susan [name] stated that she has not yet reviewed the educational booklets for diabetes self-management which were provided to her during last  [place] home visit on November 30, 2015, and set a goal today to review these booklets, specifically around A1-C measurements, which we discussed thoroughly today.   CHF:   Susan [name] has continued to monitor and record her daily weight, and I visualized/ reviewed her personal weight records dating back to February 2017.  Weight range since last [place] home visit on November 30, 2015 has been 280-284 lbs.  Today's weight:  281 lbs.  We again reviewed CHF zones, and Susan [name] reports that she has "maily been in the green zone" since last [facility] CM visit. Next appointment at [facility]: February 21, 2016. Susan [name] verbalizes that she spoke with Darrick Grinder, NP at the [facility] during yesterday's office visit and clarified that she is to take her metolazone for weight gain > 290 lbs. Susan [name]'s lower extremities look improved in appearance since last [place] home visit on November 30, 2015 (see ROS/ PE below).   Falls:   Susan [name] reports no falls since her last discharge from the hospital on October 27, 2015.  Susan [name] stated that her walking has continued to improve, and states that she has not needed to use her walker "for quite some time now."  Susan [name]'s ambulation/ gait has continued to appear improved with each [place] CM visit since she was discharged from the hospital on October 27, 2015, and she appears steady on her feet, although she continues to move slowly.  Susan [name] is actively working with [place] to have grab bars installed in her bathroom toilet and shower areas. To date, [place]  has met her [place] CM goals around fall prevention.  Subjective: "I am feeling better all the time, but am still nervous that I will get sick again."  Objective:    BP 138/60   Pulse 66   Resp 16   Wt 281 lb (127.5 kg)   SpO2 96%   BMI 45.35 kg/m    Review of Systems  Constitutional: Positive for malaise/fatigue. Negative for fever and weight loss.       Patient reports occasional periods of tiredness    Respiratory: Positive for shortness of breath. Negative for cough, sputum production and wheezing.        Patient reports ongoing (baseline) shortness of breath with activity (none with rest), that she states, "has not been bad."    Cardiovascular: Positive for leg swelling. Negative for chest pain.       Patient has +1-2 edema noted in bilateral lower extremities, (L) > (R).  Patients legs look improved from last [place] in-home visit with less swelling, no significant erythema noted.  Patient has slight oozing from 2 small blisters on her (L) lower leg that she reports are a result of "over-working" with yard work last week , stating her legs got swollen and blistered in those 2 spots which are both ~ 2 mm in diameter.  Her LE skin otherwise is C/D/I   Gastrointestinal: Negative for abdominal pain and nausea.  Genitourinary: Positive for frequency.       Patient attributes to use of diuretics as prescribed.  Musculoskeletal: Positive for joint pain. Negative for falls and myalgias.       Patient has ongoing/ chronic knee pain bilaterally; reports no pain today  Neurological: Negative.  Negative for weakness.  Psychiatric/Behavioral: Positive for depression. The patient is not nervous/anxious.        Chronic depression noted; patient reports antidepressant dosage was increased during last OV with PCP    Physical Exam  Constitutional: She is oriented to person, place, and time. She appears well-developed and well-nourished.  Cardiovascular: Normal rate, regular rhythm, normal heart sounds and intact distal pulses.   Pulses:      Radial pulses are 2+ on the right side, and 2+ on the left side.       Dorsalis pedis pulses are 1+ on the right side, and 1+ on the left side.  Respiratory: Effort normal and breath sounds normal. No respiratory distress. She has no wheezes. She has no rales.  GI: Soft. Bowel sounds are normal.  Musculoskeletal: She exhibits edema.  See ROS  Neurological:  She is alert and oriented to person, place, and time.  Skin: Skin is warm and dry. There is erythema.     Minimal erythema noted in bilateral LE; patient's legs visually appear improved from last [facility] home visit  Psychiatric: She has a normal mood and affect. Her behavior is normal. Judgment and thought content normal.    Encounter Medications:   Outpatient Encounter Prescriptions as of 12/28/2015  Medication Sig Note  . aspirin EC 81 MG tablet Take 81 mg by mouth daily.   . carvedilol (COREG) 12.5 MG tablet Take 1.5 tablets (18.75 mg total) by mouth 2 (two) times daily with a meal.   . clonazePAM (KLONOPIN) 1 MG tablet TAKE 1 TABLET BY MOUTH 3 TIMES A DAY   . clopidogrel (PLAVIX) 75 MG tablet TAKE 1 TABLET (75 MG TOTAL) BY MOUTH DAILY WITH BREAKFAST.   . ferrous sulfate 325 (65 FE) MG tablet  Take 325 mg by mouth daily with breakfast. Reported on 08/21/2015 09/27/2015: Taking non prescription form   . insulin NPH-regular Human (NOVOLIN 70/30) (70-30) 100 UNIT/ML injection Inject 35-45 Units into the skin 2 (two) times daily. 45 units in the morning and 35 units at lunch (no evening doses) 11/30/2015: Dosage updated around patient report of changes made at last OV with Dr. [name], endocrinologist   . isosorbide dinitrate (ISORDIL) 10 MG tablet TAKE 1 TABLET (10 MG TOTAL) BY MOUTH 2 (TWO) TIMES DAILY.   Marland Kitchen levothyroxine (SYNTHROID, LEVOTHROID) 50 MCG tablet Take 50 mcg by mouth daily before breakfast.   . losartan (COZAAR) 50 MG tablet TAKE 1 TABLET BY MOUTH DAILY   . metFORMIN (GLUMETZA) 500 MG (MOD) 24 hr tablet Take 500 mg by mouth 2 (two) times daily with a meal.   . nitroGLYCERIN (NITROSTAT) 0.4 MG SL tablet Place 1 tablet (0.4 mg total) under the tongue every 5 (five) minutes as needed for chest pain. 12/28/2015: Takes as needed, has not needed recently per patient report   . PARoxetine (PAXIL CR) 37.5 MG 24 hr tablet Take 1 tablet (37.5 mg total) by mouth daily.   Marland Kitchen PARoxetine (PAXIL-CR)  25 MG 24 hr tablet TAKE 1 TABLET BY MOUTH EVERY DAY   . potassium chloride SA (K-DUR,KLOR-CON) 20 MEQ tablet Take 20 mEq by mouth 2 (two) times daily.   . pregabalin (LYRICA) 150 MG capsule Take 150 mg by mouth 3 (three) times daily. Reported on 10/31/2015   . torsemide (DEMADEX) 20 MG tablet Take 4 tablets (80 mg total) by mouth daily.   Marland Kitchen albuterol (PROVENTIL HFA;VENTOLIN HFA) 108 (90 BASE) MCG/ACT inhaler Inhale 2 puffs into the lungs every 6 (six) hours as needed for wheezing or shortness of breath. Reported on 10/30/2015   . ONETOUCH VERIO test strip  12/19/2015: Received from: External Pharmacy   No facility-administered encounter medications on file as of 12/28/2015.     Functional Status:   In your present state of health, do you have any difficulty performing the following activities: 10/30/2015 10/25/2015  Hearing? N N  Vision? N N  Difficulty concentrating or making decisions? N N  Walking or climbing stairs? Y Y  Dressing or bathing? Y Y  Doing errands, shopping? N N  Preparing Food and eating ? N -  Using the Toilet? N -  In the past six months, have you accidently leaked urine? Y -  Do you have problems with loss of bowel control? N -  Managing your Medications? N -  Managing your Finances? N -  Housekeeping or managing your Housekeeping? N -  Some recent data might be hidden    Fall/Depression Screening:    PHQ 2/9 Scores 12/18/2015 10/19/2015 08/09/2015 08/07/2015 03/16/2015  PHQ - 2 Score '4 4 4 4 ' 0  PHQ- 9 Score '13 16 16 16 ' 0    Assessment:  Susan [name]'s blood sugars appear to have continued to stabilize on her current insulin dosing, and she continues to report that she feels better.   Susan [name] has continued to monitor and record her daily blood sugars and weights, and has improved on keeping her providers informed when she experiences urgent/ emergent problems or has questions about her care.  Although Susan [name] remains at risk for falls, she has not experienced any new falls and the steps  she has taken to reduce this risk appear to have been effective. Susan [name] would like to attend a diabetes support group meeting which is offered through  the [facility], and would like to continue to learn more about self-management of both her diabetes and CHF.    Plan:   Susan [name] will continue to take all of her medications as they are prescribed and will keep all scheduled provider appointments.   Susan [name] will call EMS for any urgent or emergent issues or concerns she experiences.  Susan [name] will contact her care/ medical providers for any questions, concerns, issues, or problems that she experiences prior to scheduled office visits.   Susan [name] will continue to monitor and record her daily weight and blood sugars, as she has been, and will take these values to ALL provider appointments with her.   Susan [name] will attend one of the offered support group meetings at the [place] will review educational materials provided to her about A1-C labs   [place] outreach to continue with telephone outreach next week to schedule next [place] home visit for next month.  [name], RN, BSN, [employer] [facility] Care Management  [phone]

## 2015-12-31 ENCOUNTER — Other Ambulatory Visit: Payer: Self-pay | Admitting: *Deleted

## 2015-12-31 ENCOUNTER — Ambulatory Visit: Payer: Self-pay | Admitting: *Deleted

## 2015-12-31 NOTE — Patient Outreach (Signed)
[facility]) Care Management [place] Telephone Outreach 12/31/2015  Susan [name] 11-Jul-1950 [id number]   Unsuccessful telephone outreach to Susan [name], "[place]," a 65 y.o. female well known to [place] for for self-management of chronic disease states of CHF and DM.  Call today was placed to schedule next [place] in-home visit with patient, as this was unable to be done during last [place] in home visit on Friday December 28, 2015.  HIPAA compliant voice mail message left for patient, asking her to return my call.  Plan:  Will re-attempt telephone outreach to patient later this week if I do not hear back from her first.   [name], RN, BSN, [place] Coordinator [facility] Care Management  [phone]

## 2016-01-01 ENCOUNTER — Other Ambulatory Visit: Payer: Self-pay | Admitting: *Deleted

## 2016-01-01 NOTE — Patient Outreach (Signed)
[place] [facility]) Care Management [place] Telephone Outreach 01/01/2016  Susan [name] [birth year] [id number]  Susan [name], "[place]," a 65 y.o.femalewell known to [place] for for self-management of chronic disease states of CHF and DM.  Call today was returned by patient from voice mail left for her yesterday to schedule next [place] in-home visit with patient, as this was unable to be done during last [place] in home visit on Friday December 28, 2015.    Susan [name] reports that she is doing well today and voices no concerns, problems, issues, or needs.  We scheduled next [place] in-home visit for next month.   Plan:  Susan [name] will continue to take all of her medications as they are prescribed and will keep all scheduled provider appointments.  Susan [name] will call EMS for any urgent or emergent issues or concerns she experiences.Susan [name] will contact her care/ medical providers for any questions, concerns, issues, or problems that she experiences prior to scheduled office visits.  Susan [name] will continue to monitor and record her daily weight and blood sugars, as she has been, and will take these values to ALLprovider appointments with her.  Susan [name] will attend one of the offered support group meetings at the [place] will review educational materials provided to her about A1-C labs  [place] outreach to continue with scheduled [place] home visit next month.     [name], RN, BSN, [employer] [facility] Care Management  [phone]

## 2016-01-03 ENCOUNTER — Ambulatory Visit: Payer: Self-pay | Admitting: *Deleted

## 2016-01-08 DIAGNOSIS — E113312 Type 2 diabetes mellitus with moderate nonproliferative diabetic retinopathy with macular edema, left eye: Secondary | ICD-10-CM | POA: Diagnosis not present

## 2016-01-17 ENCOUNTER — Other Ambulatory Visit: Payer: Self-pay | Admitting: Licensed Clinical Social Worker

## 2016-01-17 NOTE — Patient Outreach (Signed)
Assessment:  CSW spoke via phone with client on 01/17/16. CSW verified client identity. CSW and client spoke of client needs. Client said she has her prescribed medications and is taking medications as prescribed. She said she drives herself to scheduled medical appointments.  CSW and client spoke of client care plan.  CSW encouraged client to attend alll scheduled client medical appoointmetns in the next 30 days.  Client said she sees [name], endocrinologist, as scheduled. Client said she see [name], Nurse Practitioner, as medical provider.  Client said she fatigues easily. She said she can complete short community errands but has to rest afterwards. [facility] [place] [place], as scheduled for care. Client receives [place] support with Reginia Naas, RN.  Client has pet dogs and she said she enjoys caring for her pet dogs and spending time with her pets for relaxation. Client uses a walker to help her ambulate. She said she is eating well.  Client said that a recent medication change by her medical provider has helped client related to mood and crying episodes.  She said she is crying less and feels that her mood has improved on current prescribed medications. She spoke of spending time with her pet dogs and of the fact that her dogs help her with relaxation.  Client said she has some support from her spouse.  She said she has appointment scheduled with [name], endocrinologist, on February 05, 2016.  She said she is monitoring her sugar levels as needed.  She said she enjoys working with Reginia Naas RN in [place] needs of client. Client said she has not had any recent falls.  Client said she has had some chest pain recently and has taken medication prescribed for her chest pain. She said: "I have taken about 5 nitroglycerin tablets in the past week."  She also said she had occasional headaches. She said she was currently breathing well with no  headache. CSW informed client on 01/17/16 that [place] would call RN Reginia Naas on 01/17/16 to inform Richarda Osmond of above information regarding client chest pain.  Client agreed to this plan. Client is aware of 911 as resources for client as needed. Again, at this time, clinet said she was not having chest pain and was not having any headaches.  CSW also gave client the number for [place] for [facility] [phone]). CSW encouraged [place] to call that number to request assistance for client with grab bar installation for client's bathroom (for fall prevention). Jazarah wrote down above number and said she would call agency today to request help with grab bar installation for client's bathroom.  CSW thanked [place] for call and encouraged her to call CSW or RN Reginia Naas to discuss current needs of client.     Plan:  Client to attend all scheduled client medical appointments in the next 30 days.  CSW to collaborate with RN Reginia Naas in monitoring needs of client.  CSW to call client in 4 weeks to assess client needs.  [name].Natori Gudino MSW, LCSW Licensed Clinical Social Worker [facility] Care Management [phone]

## 2016-01-18 ENCOUNTER — Other Ambulatory Visit: Payer: Self-pay | Admitting: *Deleted

## 2016-01-18 NOTE — Patient Outreach (Signed)
[place] [facility]) Care Management  01/18/2016  Susan [name] 10/30/1950 [id number]   Unsuccessful telephone outreach to [name], "[place]," a 65 y.o.femalewell known to [place] for for self-management of chronic disease states of CHF and DM.  Call today was placed in response to care collaboration call I received yesterday from [name], [place], where he voiced concerns that patient had shared with him that patient had required use of NTG several times last week for chest pain, which is unusual for her.  HIPAA compliant voice mail message left for patient with details of my direct contact information, asking her to return my call.  Plan:  Will re-attempt telephone outreach to patient early next week if I do not hear back from her first.  [name], RN, BSN, [place] Coordinator [facility] Care Management  [phone]

## 2016-01-21 ENCOUNTER — Other Ambulatory Visit: Payer: Self-pay | Admitting: *Deleted

## 2016-01-21 NOTE — Patient Outreach (Signed)
[place] [facility]) Care Management [place] Telephone Outreach 01/21/2016  Leetta Ellender July 22, 1950 [id number]  Unsuccessful telephone outreach to [name], "[place]," [place] y.o.femalewell known to [place] for for self-management of chronic disease states of CHF and DM.  Call today was placed in response to care collaboration call I received last week from [place], [place], where he voiced concerns that patient had shared with him that she had required use of NTG several times last week for chest pain, which is unusual for her.  HIPAA compliant voice mail message left for patient with details of my direct contact information, asking her to return my call.  Plan:  Will re-attempt telephone outreach to patient later this week if I do not hear back from her first.   [name], RN, BSN, [place] Coordinator [facility] Care Management  [phone]

## 2016-01-23 ENCOUNTER — Other Ambulatory Visit: Payer: Self-pay | Admitting: *Deleted

## 2016-01-23 NOTE — Patient Outreach (Addendum)
[facility]) Care Management [place] Telephone Outreach  01/23/2016  Susan [name] [birth year] [id number]  Successful telephone outreach to Susan [name], "[employer]," [place] y.o.femalewell known to [place] for for self-management of chronic disease states of CHF and DM.HIPAA verified.  Call today was third attempt to check on patient after I was made aware of concerns that [place] had after outreach to patient last week, when she reported having chest pain several times over the course of the week.  Today, Susan [name] reports that she "has had a time" recently controlling her blood sugars, stating that she has continued to take her insulin as it is prescribed, but that her sugars "having started running high again, in the 500's."  Susan [name] also reports one episode last week of hypoglycemia in the middle of the night, where she became symptomatic and her blood sugar was 31, which was resolved by eating peanut butter and drinking orange juice.  Susan [name] was advised to contact her endocrinologist today to report her concerns with her blood sugars.  Susan [name] also reported to me that she experienced "several" episodes last week of chest pain, which was resolved by taking NTG each time.  Susan [name] reported that she has continued weighing herself daily and that her weights have "holding steady" without weight gain of >3 lbs overnight, but also reports that her bilateral LE's became "very swollen with blisters and oozing" last week.  Susan [name] reports that she has not contacted her [facility] provider, Amy Ninfa Meeker about these issues, stating, "I didn't want to bother anyone."  Susan [name] reports that her legs "are getting better" and was advised to contact Amy today to report her symptoms of chest pain and LE swelling with blistering and oozing.  I again reminded Susan [name] that she should contact her medical providers promptly when she experiences concerns, and she continued to say that she "just didn't  want to bother anyone," but agreed to contact her endocrinologist Dr. [name] and Darrick Grinder at [facility] today to report these symptoms.  I also discussed with Susan [name] need to call EMS for chest pain that is not relieved by NTG, and she verbalized understanding and agreed to do so.  Susan [name] denies further concerns, problems, issues, or needs today, and we confirmed next [place] in-home visit for next week.   Plan:  Susan [name] will continue to take all of her medications as they are prescribed and will keep all scheduled provider appointments.  Susan [name] will call EMS for any urgent or emergent issues or concerns she experiences.Susan [name] will contact her care/ medical providers today to report concerns with her blood sugars running high and her LE swelling/ blistering, without weight gain. I also notified Dr. [name] and Darrick Grinder of patient's report of new symptoms via secure messaging through EMR.  Susan [name] will continue to monitor and record her daily weight and blood sugars, as she has been, and will take these values to ALLprovider appointments with her.  Susan [name] will attend one of the offered support group meetings at the [place] will review educational materials provided to her about A1-C labs  [place] outreach to continue with scheduled [place] home visit next week.   [name], RN, BSN, [employer] [facility] Care Management  [phone]

## 2016-01-29 ENCOUNTER — Encounter: Payer: Self-pay | Admitting: *Deleted

## 2016-01-29 ENCOUNTER — Other Ambulatory Visit: Payer: Self-pay | Admitting: *Deleted

## 2016-01-29 NOTE — Patient Outreach (Addendum)
[facility]) Care Management  [place] Routine Home Visit 01/29/2016  Susan [name] [birth year] [id number]  Susan [name] is an 65 y.o. female well known to [place] for for self-management of chronic disease states of CHF and DM.  Today, although Susan [name] appears to be at her baseline, she reports that she continues to feel bad, which she attributes to her "blood sugars running high."  Susan [name] reports that her general blood sugars have been in a 200-500 range, "for 2 weeks," which was verified on review of her recorded values.  Susan [name] checked her blood sugar this morning during visit, and it read "513."  Susan [name] reports that she has continued taking her insulin as prescribed by Dr. [name].  Susan [name] reports (1) isolated episode of hypoglycemia, "in the middle of the night," last week, when her reported blood sugar was "31," resolved by eating peanut butter and drinking orange juice.  Susan [name] also continues to report intermittent chest pain, which she reports "usually goes away on it's on," but which has occasionally required use of SL NTG.  Susan [name] reports that she "lost" her bottle of NTG, and I assisted her with placing a re-fill at her pharmacy.  Susan [name] denies chest pain today.  Susan [name] continues to report that she has continued weighing/recording daily and that her weights have been "holding steady" without weight gain of >3 lbs overnight, and she reports a weight today of 276 lbs.  Susan [name] reported that she has had to take her "extra" diuretic (metolazone) "one time" over the last 2 weeks.  Susan [name] had previously reported that her bilateral LE's had become "very swollen with blisters and oozing," and her legs are swollen at her baseline from previous [facility] CM visits, but are more erythematous, with several areas of open oozing blisters (see PE).    Susan [name] also reports today that she believes she "may have a vaginal yeast infection," and reports vaginal itching and occasional  dysuria.  Susan [name] had previously reported her concerns with her blood sugars and chest pain to me last week, and was advised at that time to contact her medical providers urgently for direction, which she agreed to do; I had communicated patient's concerns/ reported symptoms at that time to her medical providers via secure messaging via EMR as an Susan [name].  Today, Susan [name] again reports that she does "not want to bother anyone" and that she "thinks she will be fine."  I strongly urged Susan [name] to contact her medical providers about her concerns, as she flatly refused allowing me to call her providers to arrange an urgent office visit for her, stating, "they will just send me back to the hospital."  Patient furthermore states that she "cannot go to the doctor" because she cannot drive when her blood sugars are high.  Patient adamantly refused my offers of assistance to attempt to arrange transportation to and from a doctor's appointment, stating that her husband can take her if necessary.  I again reminded Susan [name] that she should contact her medical providers promptly when she experiences concerns, in an effort to avoid hospitalization, and she continued to say that she "thinks" she "will be fine," and "just doesn't want to bother anyone."  Susan [name] agreed to contact her cardiology and endocrinology medical providers to report her concerns with her blood sugars and chest pain/ LE swelling, and I made the patient aware that I would also be contacting her providers through secure EMR messaging to report an update.   During our visit today, self-health  management of DM and CHF was again thoroughly reinforced with patient, along with signs and symptoms that would require patient to contact EMS, which patient expressed understanding and agreement.  EMMI educational material was provided to patient for sick-day rules for DM and self-health management of DM.  Susan [name] denies further concerns, problems, issues, or needs  today.  Subjective: "I'm having a bad day.... I haven't been feeling well for over a week."  Objective:    BP 132/64   Pulse 70   Resp 16   Wt 276 lb (125.2 kg)   SpO2 94%   BMI 44.55 kg/m    Review of Systems  Constitutional: Positive for malaise/fatigue. Negative for fever.  Respiratory: Positive for shortness of breath. Negative for cough.        Mild/ Moderate SOB with activity of walking across room; recovers easily with rest; patient appears at baseline from previous [facility] home visits  Cardiovascular: Positive for chest pain and leg swelling.       Patient reports intermittent chest pain x 1 week; currently reports pain free.  Gastrointestinal: Positive for nausea. Negative for abdominal pain and vomiting.  Genitourinary: Positive for frequency and urgency.       Diuretic therapy  Musculoskeletal: Negative for back pain, falls, joint pain and myalgias.  Skin:       Bilateral LE are erythematous; patient has (2) oozing blisters on (R) LE, and one on (L) LE; baseline peripheral LE edema of +2  Neurological: Positive for tingling. Negative for dizziness.       Neuropathy  Psychiatric/Behavioral: Positive for depression. The patient is not nervous/anxious.     Physical Exam  Constitutional: She is oriented to person, place, and time. She appears well-developed and well-nourished. No distress.  Cardiovascular: Normal rate, regular rhythm, normal heart sounds and intact distal pulses.   Respiratory: Effort normal and breath sounds normal. No respiratory distress. She has no wheezes. She has no rales.  As noted in ROS: baseline shortness of breath appears unchanged from previous [facility] home visits  GI: Soft.  Musculoskeletal: She exhibits edema.  See ROS/; SKIN  Neurological: She is alert and oriented to person, place, and time.  Skin: Skin is warm and dry. She is not diaphoretic. There is erythema.     Psychiatric: She has a normal mood and affect. Her behavior is normal.  Judgment and thought content normal.    Encounter Medications:   Outpatient Encounter Prescriptions as of 01/29/2016  Medication Sig Note  . albuterol (PROVENTIL HFA;VENTOLIN HFA) 108 (90 BASE) MCG/ACT inhaler Inhale 2 puffs into the lungs every 6 (six) hours as needed for wheezing or shortness of breath. Reported on 10/30/2015   . aspirin EC 81 MG tablet Take 81 mg by mouth daily.   . carvedilol (COREG) 12.5 MG tablet Take 1.5 tablets (18.75 mg total) by mouth 2 (two) times daily with a meal.   . clonazePAM (KLONOPIN) 1 MG tablet TAKE 1 TABLET BY MOUTH 3 TIMES A DAY   . clopidogrel (PLAVIX) 75 MG tablet TAKE 1 TABLET (75 MG TOTAL) BY MOUTH DAILY WITH BREAKFAST.   . ferrous sulfate 325 (65 FE) MG tablet Take 325 mg by mouth daily with breakfast. Reported on 08/21/2015 09/27/2015: Taking non prescription form   . insulin NPH-regular Human (NOVOLIN 70/30) (70-30) 100 UNIT/ML injection Inject 35-45 Units into the skin 2 (two) times daily. 45 units in the morning and 35 units at lunch (no evening doses) 01/29/2016: Patient reports taking 55 U in  the morning and 45 U in the afternoon; no evening dose; patient reports this change was made after seeing Dr. [name] last OV   . isosorbide dinitrate (ISORDIL) 10 MG tablet TAKE 1 TABLET (10 MG TOTAL) BY MOUTH 2 (TWO) TIMES DAILY.   Marland Kitchen levothyroxine (SYNTHROID, LEVOTHROID) 50 MCG tablet Take 50 mcg by mouth daily before breakfast.   . losartan (COZAAR) 50 MG tablet TAKE 1 TABLET BY MOUTH DAILY   . metFORMIN (GLUMETZA) 500 MG (MOD) 24 hr tablet Take 500 mg by mouth 2 (two) times daily with a meal.   . nitroGLYCERIN (NITROSTAT) 0.4 MG SL tablet Place 1 tablet (0.4 mg total) under the tongue every 5 (five) minutes as needed for chest pain. 12/28/2015: Takes as needed, has not needed recently per patient report   . ONETOUCH VERIO test strip  12/19/2015: Received from: External Pharmacy  . PARoxetine (PAXIL CR) 37.5 MG 24 hr tablet Take 1 tablet (37.5 mg total) by mouth daily.    Marland Kitchen PARoxetine (PAXIL-CR) 25 MG 24 hr tablet TAKE 1 TABLET BY MOUTH EVERY DAY   . potassium chloride SA (K-DUR,KLOR-CON) 20 MEQ tablet Take 20 mEq by mouth 2 (two) times daily.   . pregabalin (LYRICA) 150 MG capsule Take 150 mg by mouth 3 (three) times daily. Reported on 10/31/2015   . torsemide (DEMADEX) 20 MG tablet Take 4 tablets (80 mg total) by mouth daily.    No facility-administered encounter medications on file as of 01/29/2016.     Functional Status:   In your present state of health, do you have any difficulty performing the following activities: 10/30/2015 10/25/2015  Hearing? N N  Vision? N N  Difficulty concentrating or making decisions? N N  Walking or climbing stairs? Y Y  Dressing or bathing? Y Y  Doing errands, shopping? N N  Preparing Food and eating ? N -  Using the Toilet? N -  In the past six months, have you accidently leaked urine? Y -  Do you have problems with loss of bowel control? N -  Managing your Medications? N -  Managing your Finances? N -  Housekeeping or managing your Housekeeping? N -  Some recent data might be hidden    Fall/Depression Screening:    PHQ 2/9 Scores 12/18/2015 10/19/2015 08/09/2015 08/07/2015 03/16/2015  PHQ - 2 Score 4 4 4 4  0  PHQ- 9 Score 13 16 16 16  0    Assessment:  Susan [name] has not been feeling well for several weeks but will not/ does not report her symptoms to her medical providers.  Susan [name]'s blood sugars have been running high and she has been experiencing intermittent chest pain, and her bilateral LE are erythematous with oozing blisters.  Susan [name] does not wish to see her medical providers urgently, as she believes she is "doing okay," and doesn't wish to "bother" anyone.  Even when re-assured that she is not a bother, Susan [name] does not wish to go to her doctors, apparently, for fear that she will be re-hospitalized.  Susan [name] refuses my attempts to arrange urgent office visits with her providers.  Susan [name] continues to need reinforcement of  self-health management of chronic disease states of CHF and DM.  Plan:   Susan [name] will continue to take all of her medications as they are prescribed and will keep all scheduled provider appointments.  Susan [name] will call EMS for any urgent or emergent issues or concerns she experiences.Susan [name] will contact her care/ medical providers today to report concerns with her blood  sugars running high and her LE swelling/ blistering, without weight gain. I also notified Dr. [name] and Susan [name] of patient's report of ongoing symptoms via secure messaging through EMR, as reported last week.  Will also provide quarterly summary to patient's PCP.  Susan [name] will continue to monitor and record her daily weight and blood sugars, as she has been, and will take these values to ALLprovider appointments with her.  Susan [name] will review educational materials provided to her today about A1-C labs and self-health management of DM.  [place] outreach to continue with scheduled [place] home visit next month.    [name], RN, BSN, [employer] [facility] Care Management  [phone]

## 2016-02-19 ENCOUNTER — Other Ambulatory Visit: Payer: Self-pay | Admitting: Licensed Clinical Social Worker

## 2016-02-19 NOTE — Patient Outreach (Signed)
Assessment:  CSW spoke via phone with client on 02/19/16. CSW verified client identity. CSW and client spoke of client needs.  Client has been receiving [facility] nursing support with RN Reginia Naas. Client sees [name], physician assistant, for medical care as needed.  Client has prescribed medications and is trying to take medications as prescribed. CSW and client spoke of client care plan. CSW encouraged client to attend all scheduled client medical appointments in the next 30 days.  Client uses walker to ambulate. She said she sometimes fatigues easily upon ambulation. [facility] [place] [facility] as scheduled.Client said [facility] [place], [place] on tomorrow.  She said her legs are sore and that she is taking diurectic medication as prescribed.  She said she will drive herself to medical appointment in [place], [place] tomorrow. She sees [name], endocrinologist, as scheduled.  She said she was not having any chest pains at present. She has prescribed nitroglycerin tablets to take a as needed. Client is aware of 911 support and will call 911 for emergency support as needed.  Client has some family support.  Spouse of client is still working.  Client has [employer].  Client said she will call customer service with her insurance to talk with insurance representative about medication coverage for client.  Client said she is eating well and sleeping adequately.  CSW spoke with [place] about [facility] program support in nursing, social work and pharmacy services.  CSW encouraged [place] to call CSW at [phone] as needed to discuss social work needs of client.    Plan:  Client to attend all scheduled client medical appointments in the next 30 days.    CSW to collaborate with RN Reginia Naas to monitor needs of client.  CSW to call client in 4 weeks to assess client needs.  [name].Darolyn Double MSW,  LCSW Licensed Clinical Social Worker [facility] Care Management [phone]

## 2016-02-20 ENCOUNTER — Ambulatory Visit (HOSPITAL_COMMUNITY)
Admission: RE | Admit: 2016-02-20 | Discharge: 2016-02-20 | Disposition: A | Discharge status: Home / Self Care | Payer: PPO | Source: Ambulatory Visit | Attending: Internal Medicine | Admitting: Internal Medicine

## 2016-02-20 ENCOUNTER — Telehealth (HOSPITAL_COMMUNITY): Payer: Self-pay

## 2016-02-20 VITALS — BP 148/70 | HR 84 | Wt 289.2 lb

## 2016-02-20 DIAGNOSIS — R0600 Dyspnea, unspecified: Secondary | ICD-10-CM

## 2016-02-20 DIAGNOSIS — E785 Hyperlipidemia, unspecified: Secondary | ICD-10-CM | POA: Insufficient documentation

## 2016-02-20 DIAGNOSIS — Z6841 Body Mass Index (BMI) 40.0 and over, adult: Secondary | ICD-10-CM | POA: Diagnosis not present

## 2016-02-20 DIAGNOSIS — I252 Old myocardial infarction: Secondary | ICD-10-CM | POA: Diagnosis not present

## 2016-02-20 DIAGNOSIS — E039 Hypothyroidism, unspecified: Secondary | ICD-10-CM | POA: Diagnosis not present

## 2016-02-20 DIAGNOSIS — Z87891 Personal history of nicotine dependence: Secondary | ICD-10-CM | POA: Insufficient documentation

## 2016-02-20 DIAGNOSIS — Z8673 Personal history of transient ischemic attack (TIA), and cerebral infarction without residual deficits: Secondary | ICD-10-CM | POA: Insufficient documentation

## 2016-02-20 DIAGNOSIS — I872 Venous insufficiency (chronic) (peripheral): Secondary | ICD-10-CM | POA: Insufficient documentation

## 2016-02-20 DIAGNOSIS — I2510 Atherosclerotic heart disease of native coronary artery without angina pectoris: Secondary | ICD-10-CM | POA: Diagnosis not present

## 2016-02-20 DIAGNOSIS — F418 Other specified anxiety disorders: Secondary | ICD-10-CM | POA: Diagnosis not present

## 2016-02-20 DIAGNOSIS — M1990 Unspecified osteoarthritis, unspecified site: Secondary | ICD-10-CM | POA: Diagnosis not present

## 2016-02-20 DIAGNOSIS — I4510 Unspecified right bundle-branch block: Secondary | ICD-10-CM | POA: Diagnosis not present

## 2016-02-20 DIAGNOSIS — D649 Anemia, unspecified: Secondary | ICD-10-CM | POA: Diagnosis not present

## 2016-02-20 DIAGNOSIS — E1122 Type 2 diabetes mellitus with diabetic chronic kidney disease: Secondary | ICD-10-CM | POA: Insufficient documentation

## 2016-02-20 DIAGNOSIS — E1165 Type 2 diabetes mellitus with hyperglycemia: Secondary | ICD-10-CM | POA: Diagnosis not present

## 2016-02-20 DIAGNOSIS — Z794 Long term (current) use of insulin: Secondary | ICD-10-CM | POA: Insufficient documentation

## 2016-02-20 DIAGNOSIS — Z7982 Long term (current) use of aspirin: Secondary | ICD-10-CM | POA: Insufficient documentation

## 2016-02-20 DIAGNOSIS — N183 Chronic kidney disease, stage 3 (moderate): Secondary | ICD-10-CM | POA: Diagnosis not present

## 2016-02-20 DIAGNOSIS — I130 Hypertensive heart and chronic kidney disease with heart failure and stage 1 through stage 4 chronic kidney disease, or unspecified chronic kidney disease: Secondary | ICD-10-CM | POA: Diagnosis not present

## 2016-02-20 DIAGNOSIS — Z8249 Family history of ischemic heart disease and other diseases of the circulatory system: Secondary | ICD-10-CM | POA: Insufficient documentation

## 2016-02-20 DIAGNOSIS — G629 Polyneuropathy, unspecified: Secondary | ICD-10-CM | POA: Diagnosis not present

## 2016-02-20 DIAGNOSIS — R000 Tachycardia, unspecified: Secondary | ICD-10-CM | POA: Insufficient documentation

## 2016-02-20 DIAGNOSIS — I5032 Chronic diastolic (congestive) heart failure: Secondary | ICD-10-CM | POA: Diagnosis not present

## 2016-02-20 DIAGNOSIS — E669 Obesity, unspecified: Secondary | ICD-10-CM | POA: Insufficient documentation

## 2016-02-20 DIAGNOSIS — IMO0001 Reserved for inherently not codable concepts without codable children: Secondary | ICD-10-CM

## 2016-02-20 LAB — BASIC METABOLIC PANEL
Anion gap: 8 (ref 5–15)
BUN: 53 mg/dL — ABNORMAL HIGH (ref 6–20)
CO2: 27 mmol/L (ref 22–32)
Calcium: 9.1 mg/dL (ref 8.9–10.3)
Chloride: 100 mmol/L — ABNORMAL LOW (ref 101–111)
Creatinine, Ser: 2.07 mg/dL — ABNORMAL HIGH (ref 0.44–1.00)
GFR calc Af Amer: 28 mL/min — ABNORMAL LOW (ref >60)
GFR calc non Af Amer: 24 mL/min — ABNORMAL LOW (ref >60)
Glucose, Bld: 176 mg/dL — ABNORMAL HIGH (ref 65–99)
Potassium: 5.3 mmol/L — ABNORMAL HIGH (ref 3.5–5.1)
Sodium: 135 mmol/L (ref 135–145)

## 2016-02-20 LAB — BRAIN NATRIURETIC PEPTIDE: B Natriuretic Peptide: 72.9 pg/mL (ref 0.0–100.0)

## 2016-02-20 MED ORDER — TORSEMIDE 20 MG PO TABS: ORAL_TABLET | ORAL | 3 refills | Status: DC

## 2016-02-20 NOTE — Progress Notes (Signed)
Patient ID: Via Fansler, female   DOB: 1950-06-27, 65 y.o.   MRN: [id number]   PCP: Dr Edilia Bo  Cardiologist: Dr Myrtis Ser  Endocrinologist: Dr Lurene Shadow  Primary HF Cardiologist: Dr Leory Plowman   HPI: Mrs Cordell is a 65 year old with a history of RBBB, DM, Hypothyroid, diastolic heart failure, CAD MI 2000/2007 BMS 2007, TIA, obesity.   Admitted 06/07/15 with N,V, hyperglycemia. Creatinine was up to 1.9. On the day discharge creatinine was back down to 1.2 Thought to be dehydrated so diuretics were held. Diuretics restarted on the day discharge. Discharge weight 282 pounds.   Admitted 2/13 through 07/13/15 with gastroenteritis and hyper glycemia. Diuretic initially held due to dehydration. Losartan and metolazone stopped with AKI/CKD. Discharge weight was 283 pounds.   Admitted June 1st through June 3rd with altered mental status, hypothermia, and weakness. Diuretics initially held and later restarted. Unfortunately when she was discharged she refused home health.  Today she returns for HF follow up.  Overall feeling ok. SOB with exertion. Denies PND. Complains of days time fatigue. Weight at home 279-286 pounds. Eating out twice a week. Drinking > 2 liters of fluid per day. Taking all medications. Still having having trouble with glucose control.   Followed by Dr Lurene Shadow. Lives with her husband. [name] following monthly.   ECHO 07/11/2015- 50-55%.  Grade I DD  RHC 09/22/13: RA = 4  RV = 30/5/7  PA = 25/10 (16)  PCW = 7  Fick cardiac output/index = 6.3/2.7  PVR = 1.5 WU   FA sat = 93%  PA sat = 61%, 66%   Labs: 07/18/13 K 4.8 Creatinine 1.38 Glucose 1005 she refused to go to [facility] Labs : 08/17/13 K 3.9 Creatinine 1.4 Glucose 499 Labs: 09/29/13 K 4.5 Creatinine 1.4  Labs 04/18/14 K 4.4 Creatinine 1.18 Labs 03/26/2015: K 5.3 Creatinine 1.41  Labs 05/01/2015: K 4.9 Creatinine 1.8 Labs 05/04/2015: K 5.0 Creatinine 1.74  Labs 06/10/2015: K 4.7 Creatinine 1.21 Labs 06/12/2015: K 6.3 Creatinine  2.01  Labs 06/19/2015: K 4.5 Creatinine 1.29  Labs 07/10/2015: Hgb A1C 9.5  Labs 07/12/2015: K 4.4 Creatinine 1.62 K 4.4  Labs 08/10/2015: K 5.3 Creatinine 1.44  Labs 10/03/2015: K 4.5 Creatinine 1.41  Labs 10/19/15: K 4.7 creatinine 1.75  Labs 10/28/2015: K 4.5 Creatinine 1.2    SH: Former smoker quit 15 year ago. Does drink alcohol. She is disabled. Lives at home with her husband and disabled son - Husband is truck [employer] doctor. Son has a brain injury after he attempted suicide 2 years ago.    FH: Mom MI  ROS: All systems negative except as listed in HPI, PMH and Problem List.  Past Medical History:  Diagnosis Date  . Acute MI (HCC) 1999; 2007  . Anemia    hx  . Anginal pain (HCC)   . Anxiety   . Arthritis    "generalized" (03/15/2014)  . CAD (coronary artery disease)    MI in 2000 - MI  2007 - treated bare metal stent (no nuclear since then as 9/11)  . Carotid artery disease (HCC)   . CHF (congestive heart failure) (HCC)   . Chronic diastolic heart failure (HCC)    a) ECHO (08/2013) EF 55-60% and RV function nl b) RHC (08/2013) RA 4, RV 30/5/7, PA 25/10 (16), PCWP 7, Fick CO/CI 6.3/2.7, PVR 1.5 WU, PA 61 and 66%  . Daily headache    "~ every other day; since I fell in June" (03/15/2014)  . Depression   .  Dyslipidemia   . Exertional shortness of breath   . HTN (hypertension)   . Hypothyroidism   . Obesity   . Osteoarthritis   . Peripheral neuropathy (HCC)   . PONV (postoperative nausea and vomiting)   . RBBB (right bundle branch block)    Old  . Renal insufficiency    DENIES  . Stroke [facility])    mini strokes  . Syncope    likely due to low blood sugar  . Tachycardia    Sinus tachycardia  . Type II diabetes mellitus (HCC)   . Urinary incontinence   . Venous insufficiency     Current Outpatient Prescriptions  Medication Sig Dispense Refill  . albuterol (PROVENTIL HFA;VENTOLIN HFA) 108 (90 BASE) MCG/ACT inhaler Inhale 2 puffs into the lungs every 6 (six) hours as needed for  wheezing or shortness of breath. Reported on 10/30/2015    . aspirin EC 81 MG tablet Take 81 mg by mouth daily.    . carvedilol (COREG) 12.5 MG tablet Take 1.5 tablets (18.75 mg total) by mouth 2 (two) times daily with a meal. 90 tablet 6  . clonazePAM (KLONOPIN) 1 MG tablet TAKE 1 TABLET BY MOUTH 3 TIMES A DAY 90 tablet 1  . clopidogrel (PLAVIX) 75 MG tablet TAKE 1 TABLET (75 MG TOTAL) BY MOUTH DAILY WITH BREAKFAST. 90 tablet 2  . ferrous sulfate 325 (65 FE) MG tablet Take 325 mg by mouth daily with breakfast. Reported on 08/21/2015    . insulin NPH-regular Human (NOVOLIN 70/30) (70-30) 100 UNIT/ML injection Inject 50-60 Units into the skin 2 (two) times daily with a meal. Inject 50 units in the morning and 60 units in the afternoon    . isosorbide dinitrate (ISORDIL) 10 MG tablet TAKE 1 TABLET (10 MG TOTAL) BY MOUTH 2 (TWO) TIMES DAILY. 60 tablet 10  . levothyroxine (SYNTHROID, LEVOTHROID) 50 MCG tablet Take 50 mcg by mouth daily before breakfast.    . losartan (COZAAR) 50 MG tablet TAKE 1 TABLET BY MOUTH DAILY 90 tablet 1  . metFORMIN (GLUMETZA) 500 MG (MOD) 24 hr tablet Take 500 mg by mouth 2 (two) times daily with a meal.    . metolazone (ZAROXOLYN) 2.5 MG tablet Take 2.5 mg by mouth daily as needed. Weight gain/shortness of breath/swelling    . ONETOUCH VERIO test strip     . PARoxetine (PAXIL CR) 37.5 MG 24 hr tablet Take 1 tablet (37.5 mg total) by mouth daily. 30 tablet 5  . PARoxetine (PAXIL-CR) 25 MG 24 hr tablet TAKE 1 TABLET BY MOUTH EVERY DAY 30 tablet 1  . potassium chloride SA (K-DUR,KLOR-CON) 20 MEQ tablet Take 20 mEq by mouth 2 (two) times daily.    . pregabalin (LYRICA) 150 MG capsule Take 150 mg by mouth 3 (three) times daily. Reported on 10/31/2015    . torsemide (DEMADEX) 20 MG tablet Take 4 tablets (80 mg total) by mouth daily. 240 tablet 2  . nitroGLYCERIN (NITROSTAT) 0.4 MG SL tablet Place 1 tablet (0.4 mg total) under the tongue every 5 (five) minutes as needed for chest pain.  (Patient not taking: Reported on 02/20/2016) 25 tablet 3   No current facility-administered medications for this encounter.     Vitals:   02/20/16 1418  BP: (!) 148/70  BP Location: Left Arm  Patient Position: Sitting  Cuff Size: Large  Pulse: 84  SpO2: 94%  Weight: 289 lb 3.2 oz (131.2 kg)   PHYSICAL EXAM: General:  Chronically ill appearing. Ambulated in  the clinic without difficulty.     HEENT: normal Neck: supple. JVP difficult to assess d/t body habitus. Appears elevated.     Carotids 2+ bilaterally; no bruits. No lymphadenopathy or thryomegaly appreciated. Cor: PMI normal. Regular rate & rhythm. No rubs, gallops or murmurs. Lungs: decreased in the bases.  Abdomen: obese, soft, nontender, distended. No hepatosplenomegaly. No bruits or masses. Good bowel sounds. Extremities: no cyanosis, clubbing, rash, R and LLE 2+ edema. Chronic lower extremity hyperpigmentation  Neuro: alert & orientedx3, cranial nerves grossly intact. Moves all 4 extremities w/o difficulty. Affect pleasant.     ASSESSMENT & PLAN: 1. Chronic Diastolic Heart Failure: NICM, EF 55-60% Grade II DD(06/2015 )  -NYHA III symptoms. Functionally doing ok.   Volume status ok.  Continue torsemide 80 mg in am add 40 mg in pm. 20 meq potassium twice a day.    -Continue losartan 50 mg daily. - Check BMET BNP and at next visit.   - Reinforced the need and importance of daily weights, a low sodium diet, and fluid restriction (less than 2 L a day). Instructed to call the HF clinic if weight increases more than 3 lbs overnight or 5 lbs in a week 2.. Dyspnea- She has been offered sleep study many times but refuses.  Continue inhalers as needed.   3. Uncontrolled DM:  Has follow up with Dr Lurene Shadow in October. eems to better controlled.   4. CAD- no evidence of ischemia- on aspirin and statin.  5. Obesity: Discussed portion control the importance of weight loss.  6. HTN-  Stable. Continue current regimen.     7. CKD Stage III-  BMET reviewed and stable from 10/28/2015.  8. Day time fatigue: Needs sleep study but she refuses.     Follow up in 2 weeks.    Tyrika Newman NP-C  2:25 PM

## 2016-02-20 NOTE — Patient Instructions (Signed)
INCREASE Torsemide to 80 mg (4 tabs) in am and 40 mg (2 tabs) in pm.  Routine lab work today. Will notify you of abnormal results, otherwise no news is good news!  Follow up 2 weeks with [name] NP-C.  Do the following things EVERYDAY: 1) Weigh yourself in the morning before breakfast. Write it down and keep it in a log. 2) Take your medicines as prescribed 3) Eat low salt foods-Limit salt (sodium) to 2000 mg per day.  4) Stay as active as you can everyday 5) Limit all fluids for the day to less than 2 liters

## 2016-02-20 NOTE — Progress Notes (Signed)
Advanced Heart Failure Medication Review by a Pharmacist  Does the patient  feel that his/her medications are working for him/her?  No - not urinating well on torsemide or metolazone  Has the patient been experiencing any side effects to the medications prescribed?  no  Does the patient measure his/her own blood pressure or blood glucose at home?  yes   Does the patient have any problems obtaining medications due to transportation or finances?   no  Understanding of regimen: good Understanding of indications: good Potential of compliance: good Patient understands to avoid NSAIDs. Patient understands to avoid decongestants.  Issues to address at subsequent visits: None   Pharmacist comments:  Ms. Mule is a pleasant 65 yo F presenting without a medication list but with good recall of her regimen. She reports good compliance with her medications but states that she is not urinating as well as she used to be with torsemide or metolazone. She did not have any other medication-related questions or concerns for me at this time.   Ruta Hinds. Velva Harman, PharmD, BCPS, CPP Clinical Pharmacist Pager: [phone] Phone: [phone] 02/20/2016 2:31 PM      Time with patient: 10 minutes Preparation and documentation time: 2 minutes Total time: 12 minutes

## 2016-02-20 NOTE — Telephone Encounter (Signed)
Basic metabolic panel  Order: [id number]  Status:  Final result Visible to patient:  No (Not Released) Dx:  Chronic diastolic CHF (congestive hea...  Notes Recorded by Effie Berkshire, RN on 02/20/2016 at 4:48 PM EDT Patient aware.    ------  Notes Recorded by Conrad Ina, NP on 02/20/2016 at 4:26 PM EDT Please call and ask her to stop potassium

## 2016-02-21 ENCOUNTER — Encounter (HOSPITAL_COMMUNITY): Payer: PPO

## 2016-02-26 ENCOUNTER — Encounter: Payer: Self-pay | Admitting: *Deleted

## 2016-02-26 ENCOUNTER — Other Ambulatory Visit: Payer: Self-pay | Admitting: *Deleted

## 2016-02-26 NOTE — Patient Outreach (Addendum)
[name]) Care Management  [place] CM Routine Home Visit 02/26/2016  Susan [name] [birth year] [id number]  Susan [name] is an 65 y.o. female well known to Susan [name] for for self-management of chronic disease states of CHF and DM. Susan [name] has had 3 inpatient  hospital visits since January 2017, but no hospital readmissions for the last 4 months.  Susan [name] has experienced multiple falls in 2017, none resulting in serious injury.  Today, Susan [name] denies chest pain that she previously reported during last [name] CM in-home visit, however, she reports that she has now developed dizziness after her diuretic dosing was increased during last [name] provider appointment, one episode of which resulted in a fall on Friday February 22, 2016, where Susan [name] states she "laid on the kitchen floor for an hour" before she could get herself up.  Susan [name] states that she incurred no serious injury from the fall, but has remained dizzy "on and off," stating that her "knees just give out," and reports that she is now using her walker to ambulate in an effort to prevent additional falls.  Fall education was previously provided to [place] and was reinforced today.  Self-health management of DM:  Susan [name] reports no episodes of hypoglycemia since last [name] in-home visit, and states that she has an upcoming provider appointment with Susan [name] endocrinology on March 13, 2016.  From [name]'s recorded blood sugar logs, blood sugars have ranged from 146-398 for the last month.  Susan [name] is currently using her insulin as directed by Susan [name] 60 U in the a.m, and 50 U in the afternoon, with no HS insulin.  Susan [name] reports that this insulin dosing "is working" for her better than any other she has previously been prescribed.  Susan [name] reports that she reviewed the A1-C educational materials provided to her during last [facility] CM in-home visit, and that her A1-C level will be re-checked during next scheduled office visit with Susan [name] on  March 13, 2016.  Self-health management of CHF:   From [name]'s recorded daily weight logs, weights have ranged from 279-290 lbs since last [name] in-home visit.  Weight this am recorded at 287.8 lbs.  [name] had scheduled office visit with Susan Grinder, NP, [name], on February 20, 2016, where her diuretic dosing was increased.   Susan [name] reports that she has had to take her "extra" diuretic (metolazone) "about one time a week" since last [place] in-home visit.  Susan [name] reports that herbilateral LE's have continued to be "very swollen with blisters and oozing," and her legs are swollen at +3 from her ankles to her thighs, but are less erythematous than last [facility] CM in-home visit, with less areas of open oozing blisters. Susan [name] reports that she did not seek advice from Amy on how to care for her skin on her swollen LE's, and I encouraged her to do so at next scheduled office visit with Amy, scheduled March 05, 2016, which [place] agreed to do.  During last [facility] CM in-home visit, I had communicated patient's concerns/ reported symptoms to her medical providers via secure messaging via EMR as an FYI. Today, Susan [name] again reports that she does "not want to bother anyone" and that she "thinks she will be fine."  I strongly urged Susan [name] to contact her medical providers about any concerns/problems she experiences, and we again covered reasons she should call EMS for emergent medical attention.   We again thoroughly discussed self-health management of DM and CHF, and when to notify her  providers, to which patient expressed understanding and agreement, however, Susan [name] would not expressly commit to doing so today, should she need to.   Susan [name] also again verbalized lack of family support and assistance in managing her chronic health needs.  Susan [name] states that her husband and son, whom live with her "make fun of her," calling her a "fat-ass," "lard-ass," and making comments, like, "look at that pig eat," while they are  having dinner.  Susan [name] openly cried while telling me this today.  Susan [name] has a daughter whom is local, but does not live with her and is not actively involved in [name]'s life on a regular basis.  Susan [name] reports that she "has no friends" whom she can rely on for assistance or community in general.  Emotional support and encouragement were provided to House today around this very sad issue.  [name] CSW remains actively involved in [name]'s ongoing resource needs.  Susan [name] denies furtherconcerns, problems, issues, or needs today.  Subjective: "I just can't seem to get myself together.  My legs are so swollen, and I'm falling again; my knees just seem to give out."  Objective:   BP (!) 104/58   Pulse 74   Resp 18   Wt 287 lb 12.8 oz (130.5 kg)   SpO2 97%   BMI 46.45 kg/m     Review of Systems  Constitutional: Positive for malaise/fatigue. Negative for fever and weight loss.  Respiratory: Positive for shortness of breath. Negative for cough.        Patient exhibits shortness of breath with activity of walking across floor at her home; recovers within 2-3 minutes after resting  Cardiovascular: Positive for leg swelling. Negative for chest pain.       Bilateral LE swelling +3; with (R) slightly > (L); legs remain reddish-pink in color bilaterally and oozing noted over various open blisters bilaterally  Gastrointestinal: Negative.  Negative for abdominal pain, nausea and vomiting.  Genitourinary: Positive for frequency and urgency.       Patient attributes frequency/ urgency to diuretic therapy  Musculoskeletal: Positive for falls and joint pain.       "My knees just give out."  Patient reports fall on Friday 02/22/16 at home; no serious injury, patient reports that she lay on floor x 1 hour, and was able to get herself up.  Patient reports using walker now at home.  Neurological: Positive for dizziness and weakness.       Patient reports intermittent dizziness x "2 weeks"   Psychiatric/Behavioral: Positive for depression. The patient is not nervous/anxious.     Physical Exam  Constitutional: She is oriented to person, place, and time. She appears well-developed and well-nourished. No distress.  Cardiovascular: Normal rate, regular rhythm, normal heart sounds and intact distal pulses.   Pulses:      Radial pulses are 2+ on the right side, and 2+ on the left side.  Unable to definitively palpate bilateral DP secondary to +3 pedal edema bilaterally  Respiratory: Effort normal and breath sounds normal. No respiratory distress. She has no wheezes. She has no rales.  Patient is short of breath with activity; see ROS  GI: Soft. Bowel sounds are normal.  Musculoskeletal: She exhibits edema.  See ROS; +3 edema noted from feet to knees bilaterally  Neurological: She is alert and oriented to person, place, and time.  Skin: Skin is warm and dry. There is erythema.  Psychiatric: She has a normal mood and affect. Her behavior is normal. Thought content normal.  Encounter Medications:   Outpatient Encounter Prescriptions as of 02/26/2016  Medication Sig Note  . albuterol (PROVENTIL HFA;VENTOLIN HFA) 108 (90 BASE) MCG/ACT inhaler Inhale 2 puffs into the lungs every 6 (six) hours as needed for wheezing or shortness of breath. Reported on 10/30/2015   . aspirin EC 81 MG tablet Take 81 mg by mouth daily.   . carvedilol (COREG) 12.5 MG tablet Take 1.5 tablets (18.75 mg total) by mouth 2 (two) times daily with a meal.   . clonazePAM (KLONOPIN) 1 MG tablet TAKE 1 TABLET BY MOUTH 3 TIMES A DAY   . clopidogrel (PLAVIX) 75 MG tablet TAKE 1 TABLET (75 MG TOTAL) BY MOUTH DAILY WITH BREAKFAST.   . ferrous sulfate 325 (65 FE) MG tablet Take 325 mg by mouth daily with breakfast. Reported on 08/21/2015 09/27/2015: Taking non prescription form   . insulin NPH-regular Human (NOVOLIN 70/30) (70-30) 100 UNIT/ML injection Inject 50-60 Units into the skin 2 (two) times daily with a meal. Inject  50 units in the morning and 60 units in the afternoon   . isosorbide dinitrate (ISORDIL) 10 MG tablet TAKE 1 TABLET (10 MG TOTAL) BY MOUTH 2 (TWO) TIMES DAILY.   Marland Kitchen levothyroxine (SYNTHROID, LEVOTHROID) 50 MCG tablet Take 50 mcg by mouth daily before breakfast.   . losartan (COZAAR) 50 MG tablet TAKE 1 TABLET BY MOUTH DAILY   . metFORMIN (GLUMETZA) 500 MG (MOD) 24 hr tablet Take 500 mg by mouth 2 (two) times daily with a meal.   . metolazone (ZAROXOLYN) 2.5 MG tablet Take 2.5 mg by mouth daily as needed. Weight gain/shortness of breath/swelling 02/20/2016: Took one dose last Sunday   . nitroGLYCERIN (NITROSTAT) 0.4 MG SL tablet Place 1 tablet (0.4 mg total) under the tongue every 5 (five) minutes as needed for chest pain. (Patient not taking: Reported on 02/20/2016) 12/28/2015: Takes as needed, has not needed recently per patient report   . ONETOUCH VERIO test strip    . PARoxetine (PAXIL CR) 37.5 MG 24 hr tablet Take 1 tablet (37.5 mg total) by mouth daily.   Marland Kitchen PARoxetine (PAXIL-CR) 25 MG 24 hr tablet TAKE 1 TABLET BY MOUTH EVERY DAY   . pregabalin (LYRICA) 150 MG capsule Take 150 mg by mouth 3 (three) times daily. Reported on 10/31/2015   . torsemide (DEMADEX) 20 MG tablet Take 80 mg (4 tabs) in am and 40 mg (2 tabs) in pm    No facility-administered encounter medications on file as of 02/26/2016.     Functional Status:   In your present state of health, do you have any difficulty performing the following activities: 02/26/2016 10/30/2015  Hearing? N N  Vision? N N  Difficulty concentrating or making decisions? Y N  Walking or climbing stairs? Y Y  Dressing or bathing? N Y  Doing errands, shopping? N N  Preparing Food and eating ? N N  Using the Toilet? N N  In the past six months, have you accidently leaked urine? Y Y  Do you have problems with loss of bowel control? N N  Managing your Medications? N N  Managing your Finances? N N  Housekeeping or managing your Housekeeping? N N  Some recent  data might be hidden    Fall/Depression Screening:    PHQ 2/9 Scores 12/18/2015 10/19/2015 08/09/2015 08/07/2015 03/16/2015  PHQ - 2 Score 4 4 4 4  0  PHQ- 9 Score 13 16 16 16  0    Assessment:  Susan [name] has continued to not feel  well for several months but will not/ does not report her symptoms to her medical providers.  [name]'s blood sugars have been maintaining consistently,and she has not been experiencing chest pain as reported during last [facility] CM in-home visit.  However, Susan [name] has developed intermittent dizziness for several weeks, and has experienced another fall.  [name]'s bilateral LE are erythematous with oozing blisters, and although they are less erythematous than last [name] in-home visit, they appear to be more swollen.  Susan [name] continues to not wish to contact her medical providers urgently, as she believes she is a "bother." Even when re-assured that she is not a bother, and that her providers want to be notified when she is having problems, Susan [name] does not wish to go to her doctors urgently, apparently, for fear that she will be re-hospitalized.  Susan [name] provides an accurate report of upcoming office visits with her providers and generally attends scheduled/ routine appointments.  Susan [name] continues to need reinforcement of self-health management of chronic disease states of CHF and DM, as well as compliance with instructions provided to her regarding reasons to seek health care urgently/ emergently.  Susan [name] does not have a strong support system within her family.  Plan:   Susan [name] will continue to take all of her medications as they are prescribed and will keep all scheduled provider appointments.  Susan [name] will call EMS for any urgent or emergent issues or concerns she experiences.Susan [name] will contact her care/ medical providers to report concerns with her blood sugars running high or low, signs and symptoms of fluid overload in the setting of CHF, or any falls she experiences.  I will notify Susan Grinder, NP  [name] via secure messaging through EMR of patient's reports of dizziness and her BP reading today.  Susan [name] will use her walker to ambulate in an effort to prevent falling.  Susan [name] will continue to monitor and record her daily weight and blood sugars, as she has been, and will take these values to ALLprovider appointments with her.  [place] outreach to continue with scheduled [place] home visit next month.  [name], RN, BSN, [employer] [name] Care Management  [phone]

## 2016-03-04 ENCOUNTER — Telehealth (HOSPITAL_COMMUNITY): Payer: Self-pay

## 2016-03-04 NOTE — Telephone Encounter (Signed)
Left VM on all listed phone lines in chart asking patient to return call to CHF clinic to reschedule her apt tomorrow for an earlier time tomorrow or another day as clinic will be closed after lunch.  Renee Pain, RN

## 2016-03-05 ENCOUNTER — Encounter (HOSPITAL_COMMUNITY): Payer: PPO

## 2016-03-06 ENCOUNTER — Other Ambulatory Visit: Payer: Self-pay | Admitting: Physician Assistant

## 2016-03-08 ENCOUNTER — Other Ambulatory Visit: Payer: Self-pay | Admitting: Physician Assistant

## 2016-03-10 ENCOUNTER — Ambulatory Visit (HOSPITAL_COMMUNITY)
Admission: RE | Admit: 2016-03-10 | Discharge: 2016-03-10 | Disposition: A | Discharge status: Home / Self Care | Payer: PPO | Source: Ambulatory Visit | Attending: Internal Medicine | Admitting: Internal Medicine

## 2016-03-10 ENCOUNTER — Encounter (HOSPITAL_COMMUNITY): Payer: Self-pay

## 2016-03-10 VITALS — BP 142/92 | HR 93 | Wt 290.0 lb

## 2016-03-10 DIAGNOSIS — Z87891 Personal history of nicotine dependence: Secondary | ICD-10-CM | POA: Diagnosis not present

## 2016-03-10 DIAGNOSIS — N183 Chronic kidney disease, stage 3 unspecified: Secondary | ICD-10-CM

## 2016-03-10 DIAGNOSIS — Z7982 Long term (current) use of aspirin: Secondary | ICD-10-CM | POA: Insufficient documentation

## 2016-03-10 DIAGNOSIS — I4510 Unspecified right bundle-branch block: Secondary | ICD-10-CM | POA: Diagnosis not present

## 2016-03-10 DIAGNOSIS — I5033 Acute on chronic diastolic (congestive) heart failure: Secondary | ICD-10-CM | POA: Diagnosis not present

## 2016-03-10 DIAGNOSIS — Z7902 Long term (current) use of antithrombotics/antiplatelets: Secondary | ICD-10-CM | POA: Diagnosis not present

## 2016-03-10 DIAGNOSIS — E039 Hypothyroidism, unspecified: Secondary | ICD-10-CM | POA: Insufficient documentation

## 2016-03-10 DIAGNOSIS — E1122 Type 2 diabetes mellitus with diabetic chronic kidney disease: Secondary | ICD-10-CM | POA: Insufficient documentation

## 2016-03-10 DIAGNOSIS — I252 Old myocardial infarction: Secondary | ICD-10-CM | POA: Insufficient documentation

## 2016-03-10 DIAGNOSIS — I5032 Chronic diastolic (congestive) heart failure: Secondary | ICD-10-CM | POA: Diagnosis not present

## 2016-03-10 DIAGNOSIS — E785 Hyperlipidemia, unspecified: Secondary | ICD-10-CM | POA: Diagnosis not present

## 2016-03-10 DIAGNOSIS — E1165 Type 2 diabetes mellitus with hyperglycemia: Secondary | ICD-10-CM

## 2016-03-10 DIAGNOSIS — I130 Hypertensive heart and chronic kidney disease with heart failure and stage 1 through stage 4 chronic kidney disease, or unspecified chronic kidney disease: Secondary | ICD-10-CM | POA: Insufficient documentation

## 2016-03-10 DIAGNOSIS — M1990 Unspecified osteoarthritis, unspecified site: Secondary | ICD-10-CM | POA: Insufficient documentation

## 2016-03-10 DIAGNOSIS — E118 Type 2 diabetes mellitus with unspecified complications: Secondary | ICD-10-CM

## 2016-03-10 DIAGNOSIS — Z794 Long term (current) use of insulin: Secondary | ICD-10-CM | POA: Diagnosis not present

## 2016-03-10 DIAGNOSIS — G629 Polyneuropathy, unspecified: Secondary | ICD-10-CM | POA: Diagnosis not present

## 2016-03-10 DIAGNOSIS — E669 Obesity, unspecified: Secondary | ICD-10-CM | POA: Insufficient documentation

## 2016-03-10 DIAGNOSIS — I878 Other specified disorders of veins: Secondary | ICD-10-CM | POA: Insufficient documentation

## 2016-03-10 DIAGNOSIS — F418 Other specified anxiety disorders: Secondary | ICD-10-CM | POA: Diagnosis not present

## 2016-03-10 DIAGNOSIS — I429 Cardiomyopathy, unspecified: Secondary | ICD-10-CM | POA: Diagnosis not present

## 2016-03-10 DIAGNOSIS — I2510 Atherosclerotic heart disease of native coronary artery without angina pectoris: Secondary | ICD-10-CM

## 2016-03-10 DIAGNOSIS — Z8673 Personal history of transient ischemic attack (TIA), and cerebral infarction without residual deficits: Secondary | ICD-10-CM | POA: Insufficient documentation

## 2016-03-10 DIAGNOSIS — Z8249 Family history of ischemic heart disease and other diseases of the circulatory system: Secondary | ICD-10-CM | POA: Insufficient documentation

## 2016-03-10 DIAGNOSIS — R000 Tachycardia, unspecified: Secondary | ICD-10-CM | POA: Diagnosis not present

## 2016-03-10 DIAGNOSIS — I872 Venous insufficiency (chronic) (peripheral): Secondary | ICD-10-CM | POA: Insufficient documentation

## 2016-03-10 DIAGNOSIS — R0683 Snoring: Secondary | ICD-10-CM

## 2016-03-10 DIAGNOSIS — N182 Chronic kidney disease, stage 2 (mild): Secondary | ICD-10-CM

## 2016-03-10 DIAGNOSIS — R4182 Altered mental status, unspecified: Secondary | ICD-10-CM | POA: Diagnosis not present

## 2016-03-10 DIAGNOSIS — IMO0002 Reserved for concepts with insufficient information to code with codable children: Secondary | ICD-10-CM

## 2016-03-10 LAB — BASIC METABOLIC PANEL
Anion gap: 14 (ref 5–15)
BUN: 47 mg/dL — ABNORMAL HIGH (ref 6–20)
CO2: 25 mmol/L (ref 22–32)
Calcium: 8.4 mg/dL — ABNORMAL LOW (ref 8.9–10.3)
Chloride: 89 mmol/L — ABNORMAL LOW (ref 101–111)
Creatinine, Ser: 2.04 mg/dL — ABNORMAL HIGH (ref 0.44–1.00)
GFR calc Af Amer: 28 mL/min — ABNORMAL LOW (ref >60)
GFR calc non Af Amer: 24 mL/min — ABNORMAL LOW (ref >60)
Glucose, Bld: 630 mg/dL (ref 65–99)
Potassium: 3.7 mmol/L (ref 3.5–5.1)
Sodium: 128 mmol/L — ABNORMAL LOW (ref 135–145)

## 2016-03-10 NOTE — Progress Notes (Signed)
Patient ID: Susan [name], female   DOB: Dec 22, 1950, 65 y.o.   MRN: [id number]   PCP: Dr Scot Dock  Cardiologist: Dr Ron Parker  Endocrinologist: Dr Bubba Camp  Primary HF Cardiologist: Dr. Haroldine Laws   HPI: Susan [name] is a 65 year old with a history of RBBB, DM, Hypothyroid, diastolic heart failure, CAD MI 2000/2007 BMS 2007, TIA, obesity.   Admitted 06/07/15 with N,V, hyperglycemia. Creatinine was up to 1.9. On the day discharge creatinine was back down to 1.2 Thought to be dehydrated so diuretics were held. Diuretics restarted on the day discharge. Discharge weight 282 pounds.   Admitted 2/13 through 07/13/15 with gastroenteritis and hyper glycemia. Diuretic initially held due to dehydration. Losartan and metolazone stopped with AKI/CKD. Discharge weight was 283 pounds.   Admitted June 1st through June 3rd with altered mental status, hypothermia, and weakness. Diuretics initially held and later restarted. Unfortunately when she was discharged she refused home health.  She returns today for regular follow up. Feeling OK.  Weight down 5 lbs at home. Eats out twice a week. Trying to keep an eye on her fluid and salt intake.  She states she is still trying to drink < 2 L, though she is eating ice throughout the day. Continues to eat out at least twice a week. She had [employer] sandwich prior to her appointment (1350 mg of sodium). Takes metolazone once a week. Taking all medications as directed. Still having difficulties with glucose control (Sees Dr Bubba Camp). [name] comes out monthly. Denies lightheadedness or dizziness. Still SOB with moderate exertion. Legs occasionally get weepy.    ECHO 07/11/2015- 50-55%.  Grade I DD  RHC 09/22/13: RA = 4  RV = 30/5/7  PA = 25/10 (16)  PCW = 7  Fick cardiac output/index = 6.3/2.7  PVR = 1.5 WU   FA sat = 93%  PA sat = 61%, 66%   Labs: 07/18/13 K 4.8 Creatinine 1.38 Glucose 1005 she refused to go to [facility] Labs : 08/17/13 K 3.9 Creatinine 1.4 Glucose  499 Labs: 09/29/13 K 4.5 Creatinine 1.4  Labs 04/18/14 K 4.4 Creatinine 1.18 Labs 03/26/2015: K 5.3 Creatinine 1.41  Labs 05/01/2015: K 4.9 Creatinine 1.8 Labs 05/04/2015: K 5.0 Creatinine 1.74  Labs 06/10/2015: K 4.7 Creatinine 1.21 Labs 06/12/2015: K 6.3 Creatinine 2.01  Labs 06/19/2015: K 4.5 Creatinine 1.29  Labs 07/10/2015: Hgb A1C 9.5  Labs 07/12/2015: K 4.4 Creatinine 1.62 K 4.4  Labs 08/10/2015: K 5.3 Creatinine 1.44  Labs 10/03/2015: K 4.5 Creatinine 1.41  Labs 10/19/15: K 4.7 creatinine 1.75  Labs 10/28/2015: K 4.5 Creatinine 1.2    SH: Former smoker quit 15 year ago. Does drink alcohol. She is disabled. Lives at home with her husband and disabled son - Husband is truck Geophysicist/field seismologist. Son has a brain injury after he attempted suicide 2 years ago.    FH: Mom MI  ROS: All systems negative except as listed in HPI, PMH and Problem List.  Past Medical History:  Diagnosis Date  . Acute MI 1999; 2007  . Anemia    hx  . Anginal pain ([place])   . Anxiety   . Arthritis    "generalized" (03/15/2014)  . CAD (coronary artery disease)    MI in 2000 - MI  2007 - treated bare metal stent (no nuclear since then as 9/11)  . Carotid artery disease ([place])   . CHF (congestive heart failure) ([place])   . Chronic diastolic heart failure (HCC)    a) ECHO (08/2013) EF 55-60%  and RV function nl b) RHC (08/2013) RA 4, RV 30/5/7, PA 25/10 (16), PCWP 7, Fick CO/CI 6.3/2.7, PVR 1.5 WU, PA 61 and 66%  . Daily headache    "~ every other day; since I fell in June" (03/15/2014)  . Depression   . Dyslipidemia   . Exertional shortness of breath   . HTN (hypertension)   . Hypothyroidism   . Obesity   . Osteoarthritis   . Peripheral neuropathy ([place])   . PONV (postoperative nausea and vomiting)   . RBBB (right bundle branch block)    Old  . Renal insufficiency    DENIES  . Stroke [facility])    mini strokes  . Syncope    likely due to low blood sugar  . Tachycardia    Sinus tachycardia  . Type II diabetes mellitus ([place])    . Urinary incontinence   . Venous insufficiency     Current Outpatient Prescriptions  Medication Sig Dispense Refill  . albuterol (PROVENTIL HFA;VENTOLIN HFA) 108 (90 BASE) MCG/ACT inhaler Inhale 2 puffs into the lungs every 6 (six) hours as needed for wheezing or shortness of breath. Reported on 10/30/2015    . aspirin EC 81 MG tablet Take 81 mg by mouth daily.    . carvedilol (COREG) 12.5 MG tablet Take 1.5 tablets (18.75 mg total) by mouth 2 (two) times daily with a meal. 90 tablet 6  . clonazePAM (KLONOPIN) 1 MG tablet TAKE 1 TABLET BY MOUTH 3 TIMES A DAY 90 tablet 1  . clopidogrel (PLAVIX) 75 MG tablet TAKE 1 TABLET (75 MG TOTAL) BY MOUTH DAILY WITH BREAKFAST. 90 tablet 2  . ferrous sulfate 325 (65 FE) MG tablet Take 325 mg by mouth daily with breakfast. Reported on 08/21/2015    . insulin NPH-regular Human (NOVOLIN 70/30) (70-30) 100 UNIT/ML injection Inject 50-60 Units into the skin 2 (two) times daily with a meal. Inject 50 units in the morning and 60 units in the afternoon    . isosorbide dinitrate (ISORDIL) 10 MG tablet TAKE 1 TABLET (10 MG TOTAL) BY MOUTH 2 (TWO) TIMES DAILY. 60 tablet 10  . levothyroxine (SYNTHROID, LEVOTHROID) 50 MCG tablet Take 50 mcg by mouth daily before breakfast.    . losartan (COZAAR) 50 MG tablet TAKE 1 TABLET BY MOUTH DAILY 90 tablet 1  . metFORMIN (GLUMETZA) 500 MG (MOD) 24 hr tablet Take 500 mg by mouth 2 (two) times daily with a meal.    . metolazone (ZAROXOLYN) 2.5 MG tablet Take 2.5 mg by mouth daily as needed. Weight gain/shortness of breath/swelling    . nitroGLYCERIN (NITROSTAT) 0.4 MG SL tablet Place 1 tablet (0.4 mg total) under the tongue every 5 (five) minutes as needed for chest pain. 25 tablet 3  . ONETOUCH VERIO test strip     . PARoxetine (PAXIL-CR) 25 MG 24 hr tablet TAKE 1 TABLET BY MOUTH EVERY DAY 30 tablet 1  . pregabalin (LYRICA) 150 MG capsule Take 150 mg by mouth 3 (three) times daily. Reported on 10/31/2015    . torsemide (DEMADEX) 20  MG tablet Take 80 mg (4 tabs) in am and 40 mg (2 tabs) in pm 180 tablet 3   No current facility-administered medications for this encounter.     Vitals:   03/10/16 1419  BP: (!) 142/92  BP Location: Left Arm  Patient Position: Sitting  Cuff Size: Large  Pulse: 93  SpO2: 96%  Weight: 290 lb (131.5 kg)   Wt Readings from Last 3  Encounters:  03/10/16 290 lb (131.5 kg)  02/26/16 287 lb 12.8 oz (130.5 kg)  02/20/16 289 lb 3.2 oz (131.2 kg)     PHYSICAL EXAM: General:  Chronically ill appearing. Ambulated into the clinic without difficulty     HEENT: normal Neck: supple. JVP difficult to assess due to body habitus.      Carotids 2+ bilaterally; no bruits. No thyromegaly or nodule noted. Cor: PMI normal. RRR. No M/G/R Lungs: decreased in the bases.  Abdomen: obese, soft, NT, ND, no HSM. No bruits or masses. +BS  Extremities: no cyanosis, clubbing, rash, BLE 1-2+ edema.  Chronic venous stasis changes with several weeping spots.  Neuro: alert & orientedx3, cranial nerves grossly intact. Moves all 4 extremities w/o difficulty. Affect pleasant.     ASSESSMENT & PLAN: 1. Chronic Diastolic Heart Failure: NICM, EF 55-60% Grade II DD(06/2015 )  -NYHA III symptoms. - Functional status stable, as well as volume status.  - Continue torsemide 80 mg in am and 40 mg in pm. Off potassium now with recent high K.  BMET/BNP today.     - Continue losartan 50 mg daily. - Continue coreg 18.75 mg BID - She continues to drink > 2 L a day and eat salty take out meals.  - Reinforced importance of daily weights, low sodium diet, and fluid restriction. Instructed to call the HF clinic if weight increases more than 3 lbs overnight or 5 lbs in a week 2.. Dyspnea - Use inhalers as needed. Has refused sleep studies/CPAP. 3. Uncontrolled DM:   - Seeing Dr. Bubba Camp. Has been more stable on recent checks.  4. CAD - No CP or ACS symptoms.  - Continue ASA 81 mg daily. 5. Obesity: - Again reviewed portion  control and importance of weight loss.   6. HTN - Relatively stable. Had very salty lunch.  7. CKD Stage III - BMET/BNP today.   8. Day time fatigue - Continues to refuse sleep study.  9. Chronic Venous Stasis - Have recommended she wrap her legs. She states she has done it before and is comfortable to do again.   Follow up 3 weeks. She is adamant that she would prefer a female provider (Amy) when at all possible.    Shirley Friar PA-C  2:47 PM

## 2016-03-10 NOTE — Patient Instructions (Signed)
Labs today We will only contact you if something comes back abnormal or we need to make some changes. Otherwise no news is good news!  Your physician recommends that you schedule a follow-up appointment in: 3 weeks with [name],NP   Do the following things EVERYDAY: 1) Weigh yourself in the morning before breakfast. Write it down and keep it in a log. 2) Take your medicines as prescribed 3) Eat low salt foods-Limit salt (sodium) to 2000 mg per day.  4) Stay as active as you can everyday 5) Limit all fluids for the day to less than 2 liters

## 2016-03-11 ENCOUNTER — Telehealth (HOSPITAL_COMMUNITY): Payer: Self-pay

## 2016-03-11 NOTE — Telephone Encounter (Signed)
Rx refilled per protocol 

## 2016-03-11 NOTE — Telephone Encounter (Signed)
Basic metabolic panel  Order: [id number]  Status:  Final result Visible to patient:  No (Not Released) Next appt:  03/21/2016 at 11:00 AM in [facility] (For[place], Scott, LCSW) Dx:  Acute on chronic diastolic CHF (conge...  Notes Recorded by Effie Berkshire, RN on 03/11/2016 at 12:10 PM EDT Patient reports having apt with her endocrine Dr. [name]) this Thursday. Also admits to noncompliance with medications (i forget to take the afternoon doses). Advised to set alarm on her phone or make reminders on fridge to take afternoon dose and to try to start making regimen for checking CBG morning noon and night associated with meals. Advised to tell her endocrine provider that she has been noncompliant before any medication adjustments are increased. Aware and agreeable. Will forward to Dr. [name]. ------  Notes Recorded by Effie Berkshire, RN on 03/11/2016 at 8:44 AM EDT Forwarded to PCP ------  Notes Recorded by Shirley Friar, PA-C on 03/10/2016 at 4:12 PM EDT K and Creatinine stable. Needs to follow up with PCP for Glucose ASAP.    Legrand Como [address] [place], PA-C 03/10/2016 4:12 PM

## 2016-03-12 ENCOUNTER — Encounter: Payer: Self-pay | Admitting: *Deleted

## 2016-03-12 ENCOUNTER — Other Ambulatory Visit: Payer: Self-pay | Admitting: *Deleted

## 2016-03-12 NOTE — Patient Outreach (Addendum)
[facility]) Care Management [place] Telephone Outreach x 4 03/12/2016  Susan [name] Nov 07, 1950 [id number]  Successful telephone outreach to Susan [name] is an 65 y.o. female well known to [place] for for self-management of chronic disease states of CHF and DM. Susan [name] has had 3 inpatient  hospital visits since January 2017, but no hospital readmissions for the last 4-plus months.    Received voicemail message from patient this afternoon, asking me to return her call and stating that she was "so sick;" returned her call within the hour x 3 attempts and eventually the patient called me back.  HIPAA/ identity verified.  Today Susan [name] reports that she has "been so sick since Monday." (March 10, 2016).  Susan [name] states that she attended her scheduled Heart/ Vascular appointment on March 10, 2016, and "felt really bad while I was there."  Susan [name] stated that she became sick with nausea and vomiting after the appointment was over and "has been sick ever since."  [place] stated that they Heart/ Vascular staff contacted her the next day to tell her that her blood sugar reading during the visit "was over 600."  Susan [name] states that she attempted to call the Heart/ Vascular Center, and has left messages asking for return call back, and has thus far not heard back from anyone.  Susan [name] states that she has been vomiting for 2 days and has been unable to eat, check her blood sugar readings, or take her insulin/ medications due to her symptoms.  Susan [name] states that her "head is pounding," and that she "feels unsteady."  Susan [name] reports that she has a scheduled office visit with her endocrinologist tomorrow, but is so sick doesn't believe she will be able to attend the appointment.  I advised [name] to contact EMS to go to ED for evaluation immediately, which she agreed to do, however, also stated that she "might wait" until her husband gets home from work so he can take her.  We discussed the  need for patient seek care urgently/ emergently, and [place] voiced understanding and said she would do.  Plan:  Susan [name] will seek urgent/ emergent care today for evaluation of the symptoms she is experiencing.  FYI regarding today's sick call from patient sent via secure messaging through EMR to patient's PCP, heart/ vascular, and endocrinology providers.   [name], RN, BSN, [employer] [facility] Care Management  [phone]

## 2016-03-14 ENCOUNTER — Other Ambulatory Visit: Payer: Self-pay | Admitting: *Deleted

## 2016-03-14 NOTE — Patient Outreach (Signed)
[facility]) Care Management [place] Telephone Outreach 03/14/2016  [name] 20-Aug-1950 [id number]  Successful telephone outreach to Susan [name] an 65 y.o.femalewell known to [place] for for self-management of chronic disease states of CHF and DM. Susan [name] has had 3 inpatient hospital visits since January 2017, but no hospital readmissions for the last 4-plus months.   Call today was placed in follow up to call I received from patient on Wednesday, March 12, 2016 when patient reported that she was "so sick" with nausea, vomiting, high blood sugars with inability to check her blood sugars and take her insulin.  HIPAA/ identity verified.  Today, Susan [name] states that she has continued to be sick, stating that her vomiting has stopped but she now has diarrhea.  Susan [name] reports that she believes she is running a fever but has not checked it, so is not sure; states that she is having chills and is "freezing."  Susan [name] states that her "head is pounding, like it is bursting open; it is killing me."    Susan [name] reports that she did not seek urgent/ emergent care as advised on Wednesday 03/12/16 as she "just didn't want to go to the hospital."   Susan [name] reported that she called her endocrinologist to cancel the appointment she had on Thursday March 13, 2016, and was told at that time that she did not have a scheduled appointment that day; Susan [name] reports that she did NOT share with the endocrinology office staff how sick she has been all week.   Susan [name] reports that she has been "taking half doses of her insulin," which she reports is what she was previously advised by her endocrinologist for sick days.  Susan [name] stated that she has been intermittently checking her blood sugars with readings each time of "HIGH."   [name] and I had a long discussion today that the signs/ symptoms she is reporting are very serious, could be life threatening, and need to be addressed immediately  addressed in an ED setting where they can provide the appropriate monitoring/ care.  [name] verbalizes understanding of this, and states that she will call her family to take her to the ED today.  When I offered to call her daughter/ husband for her, she adamantly refused my offer, stating that she would call, "so they don't get mad at me."  When I asked why her family would get "mad" if I called them, she replied, "because they are at work."  [place] assured me again that she would call her family or 9-1-1 and would seek the emergent care she needs today.  Numerous times during this call I advised Susan [name] to contact EMS to go to ED for evaluation immediately, to which [place] voiced understanding and said she would do.  Plan:  Susan [name] will seek urgent/ emergent care today for evaluation of the symptoms she is experiencing.  FYI regarding today's sick call from patient sent via secure messaging through EMR to patient's PCP, heart/ vascular, and endocrinology providers.   [name], RN, BSN, [employer] [facility] Care Management  [phone]

## 2016-03-19 ENCOUNTER — Other Ambulatory Visit: Payer: Self-pay | Admitting: *Deleted

## 2016-03-19 NOTE — Patient Outreach (Signed)
[facility]) Care Management [place] Telephone Outreach 03/19/2016  [name] [birth year] [id number]  Unsuccessful telephone outreach to [facility], 65 y.o.femalewell known to [place] for for self-management of chronic disease states of CHF and DM. [name] has had 3 inpatient hospital visits since January 2017, but no hospital readmissions for the last 4-plusmonths. Call today was placed in follow up on two calls with patient last week when patient reported that she was "so sick" with nausea, vomiting, high blood sugars with inability to check her blood sugars and take her insulin.   HIPAA compliant voice mail message was left for patient, asking for return call back; left details of my upcoming schedule on patient's voice mail.   Plan:  [place] outreach to continue with scheduled in-home visit next month if I do not hear back from patient first.   [name], RN, BSN, [place] Coordinator [facility] Care Management  [phone]

## 2016-03-21 ENCOUNTER — Encounter: Payer: Self-pay | Admitting: *Deleted

## 2016-03-21 ENCOUNTER — Other Ambulatory Visit: Payer: Self-pay | Admitting: Licensed Clinical Social Worker

## 2016-03-21 NOTE — Patient Outreach (Signed)
Assessment:  CSW spoke via phone with Susan [name] on 03/21/16.  CSW verified Susan [name] identity. CSW and Susan [name] spoke of Susan [name] needs.  Susan [name] sees Susan [name], Conservation officer, historic buildings, Certified for primary medical care. Susan [name] said she had her prescribed medications and is taking medications as prescribed.She said she usually drives herself to her scheduled medical appointments. She said she is eating well. She said she does have some difficulty sleeping. Susan [name] att[facility] [place], [place]. She sees [name], endocrinologist, as scheduled to monitor her sugar levels and diabetes needs. Susan [name] receives [place] support with RN Susan [name]. Susan [name] has a cane to assist her with walking.  She also has a walker to assist her in walking . She has had a history of falls.  Susan [name] also has a 3 in 1 bedside commode to use in the bathroom as needed. Susan [name] has [employer]. She has occasional chest pain and has prescribed nitroglycerin to use related to chest pain issues. She has talked with Susan [name], her medical provider, about chest pain issues of Susan [name] and Susan [name] use of prescribed nitroglycerin.  Susan Naas RN has also talked with Susan [name] about monitoring her chest pain and that Susan [name] should call 911 as needed for emergency assistance for Susan [name].   Susan [name] and CSW spoke of Susan [name] care plan. CSW encouraged Susan [name] to attend all scheduled Susan [name] medical appointments in next 30 days.  Susan [name] said she is trying to attend all her medical appointments. She said she has not been able to drive as much in recent days since she was feeling fatigued and weak. She said she had been resting in bed for a number of hours today.  She said she has some support from her spouse. CSW talked with Susan [name] about her talking with her spouse to see if spouse could take a few hours off work to help Susan [name] get to appointment with Susan [name], Physician's Assistant  Certified, as needed. Susan [name] said she would speak with her spouse about this idea. She said she has no other real family members to help her. She and CSW spoke of her use of Paxil. She said that she was taking Paxil as prescribed by Susan [name]. Susan [name] said that she thought Paxil was helping Susan [name] mood and depression symptoms.Susan [name] said she had experienced less crying episodes since starting Paxil use.  CSW reminded Susan [name] that [facility] support was available in social work, nursing and pharmacy areas. CSW verified that Susan [name] had phone number for Susan Naas, RN and for CSW Susan [name].  CSW again spoke with Susan [name] about her use of 911 support and encouraged her to use 911 in situations of Susan [name] need.  Susan [name] spoke several times of her fatigue and weakness and said:  " I have not really been out of the house very much in last 2 weeks".  Susan [name] said she is eating well. She said she is taking diuretic as prescribed. CSW thanked Susan [name] for phone conversation with CSW on 03/21/16. Susan [name] was very appreciative of phone call from [place] on 03/21/16.      Plan:  Susan [name] to attend all scheduled Susan [name] medical appointments in next 30 days.  CSW to collaborate with RN Susan [name] in [place] Susan [name] needs.  CSW to call Susan [name] in 3 weeks to assess Susan [name] needs at that time.  Susan [name].Susan [name] MSW, LCSW Licensed Clinical Social Worker [facility] Care Management [phone]

## 2016-03-21 NOTE — Patient Outreach (Signed)
[facility]) Care Management  03/21/2016  Susan [name] [birth year] [id number]   Attempt made to contact pt as this RN CM covering for coworker Susan Osmond RN CM- need to  follow up on call received  from [place] reports in call that he  spoke with pt in length today, pt reported to him taking her prescription diuretic but has had no urine output in 24 hours.  Susan [name] reports pt is a fragile case with multiple health issues, sees Susan [name], [place] at [facility].  Susan [name] reports he left a voice message with Susan [name]  along with his contact number, has not heard back yet. Susan [name] wanted this RN CM covering for coworker Susan [name] to be informed.   HIPAA compliant voice message left with contact name and number.    Plan:   RN CM called Susan Juba PA office, left message with her [name] that  [facility] LCSW spoke with pt today, pt reported taking diuretic, has not had any urine output in 24 hours.  RN CM left her contact name and number as well as pt's primary RN CM Susan [name] (to return next week).              Plan to provide update to [place].     Susan [name].   [place] Care Management  [phone]

## 2016-03-22 ENCOUNTER — Telehealth: Payer: Self-pay | Admitting: Physician Assistant

## 2016-03-22 NOTE — Telephone Encounter (Signed)
Scott who is a Education officer, museum from [facility] states he is filling in for Gordon who is patient's normal case Insurance underwriter. He left a message stating he called and spoke with patient who states she has been in the bed most of the day has been taking her nitroglycerin for chest pain she hasn't voided in 1.5 days. His call back number is [phone].  Rose also from [facility] left a second message stating she spoke with patient and she told her the same thing that she hasn't voided in 1.5 days. She states that she has been taking her diuretic medicaion. When Rose tried calling patient and couldn't get an answer.   I have called provider on call Olean Ree and advised her of these two messages. She is going to try to call patient.

## 2016-03-22 NOTE — Telephone Encounter (Signed)
I have called the patient. Got voice-mail. No answer. I left message to call me --- provided my direct cell phone # to call me directly.

## 2016-03-22 NOTE — Telephone Encounter (Signed)
She did call me back. I spoke with her. I recommended she go to hospital. She refuses. I recommend she come in for OV

## 2016-03-22 NOTE — Telephone Encounter (Signed)
Since she refuses to go to the hospital, recommended she come for OV on Monday.  Go to ER if worsens over the weekend.

## 2016-03-24 ENCOUNTER — Other Ambulatory Visit: Payer: Self-pay | Admitting: *Deleted

## 2016-03-24 NOTE — Patient Outreach (Signed)
[facility]) Care Management [place] Telephone Outreach 03/24/2016  [name] 27-Dec-1950 [id number]  Unsuccessful telephone outreach to [facility], 65 y.o.femalewell known to [place] for for self-management of chronic disease states of CHF and DM. [name] has had 3 inpatient hospital visits since January 2017, but no hospital readmissions for the last 4-plusmonths. Call today was placed in follow up on calls with patient over last 2 weeks when she reported that she was"so sick" with nausea, vomiting, high blood sugars with inability to check her blood sugars and take her insulin.I was made aware by Kathie Rhodes, [facility] RN CM, that patient had continued reporting sick symptoms last week, and was advised by her PCP to seek emergency care, as she had previously been advised by me with numerous successful telephone calls with patient since March 12, 2016.  HIPAA compliant voice mail message was left for patient, asking for return call back.  Plan:  [place] outreach to continue with scheduled in-home visit next month if I do not hear back from patient first.   [name], RN, BSN, [place] Coordinator [facility] Care Management  [phone]

## 2016-03-24 NOTE — Patient Outreach (Signed)
Late entry related to phone call made 10/27 at 4:24 pm.    This RN CM covering for coworker Richarda Osmond RN CM received a voice message from [place] that he spoke with pt in length, pt mentioned she has not voided in 24 hours, taking prescribed diuretic as ordered.  Scott reports he called MD's office, left voice message with [name] PA's assistant relaying pt has not voided in 24 hours, have not heard back yet.   Therefore, this RN CM called pt, unable to contact so left contact name and number.   Also plan to call [name] PA's office.    Plan:  This RN CM called [name] PA's office, left message with her assistant about call received from [place]  That pt reported to him 10/27 that pt has not voided in 24 hours, taking prescribed diuretic as ordered as well as this RN CM trying to contact pt, unsuccessful, voice message left with contact name and number.             This RN CM to provide update to pt's primary RN CM Richarda Osmond.      Zara Chess.   [place] Care Management  [phone]

## 2016-03-25 ENCOUNTER — Other Ambulatory Visit: Payer: Self-pay | Admitting: Internal Medicine

## 2016-03-27 ENCOUNTER — Encounter: Payer: Self-pay | Admitting: *Deleted

## 2016-03-27 ENCOUNTER — Other Ambulatory Visit: Payer: Self-pay | Admitting: *Deleted

## 2016-03-27 NOTE — Patient Outreach (Signed)
[facility]) Care Management [place] Telephone Outreach 03/27/2016  Hanh Kertesz [birth year] [id number]  Unsuccessful telephone outreach to [facility], 65 y.o.femalewell known to [place] for for self-management of chronic disease states of CHF and DM. [name] has had 3 inpatient hospital visits since January 2017, but no hospital readmissions for the last 4-plusmonths. Call today was placed in follow up on calls with patient over last 2 weeks whenshe reported that she was"so sick" with nausea, vomiting, high blood sugars with inability to check her blood sugars and take her insulin.Earlier this week, I was made aware by Kathie Rhodes, [facility] RN CM, that patient had continued reporting sick symptoms last week, and was advised by her PCP to seek emergency care, as she had previously been advised by me with numerous successful telephone calls with patient since March 12, 2016.  When I was unable to reach [place] today on her preferred (home) phone number, I also attempted to contact her through her cell number, which was also unsuccessful.  HIPAA compliant voice mail message was left for patient on her home and cell phone numbers, asking for return call back.  Plan:  [place] outreach to continue with scheduled in-home visit next week if I do not hear back from patient first.  [name], RN, BSN, [place] Coordinator [facility] Care Management  [phone]

## 2016-03-28 ENCOUNTER — Encounter: Payer: Self-pay | Admitting: *Deleted

## 2016-03-28 ENCOUNTER — Other Ambulatory Visit: Payer: Self-pay | Admitting: *Deleted

## 2016-03-28 NOTE — Patient Outreach (Signed)
[facility]) Care Management [place] Telephone Outreach 03/28/2016  Susan [name] 07-08-1950 [id number]   Successful incoming call from [facility], 65 y.o.femalewell known to [place] for for self-management of chronic disease states of CHF and DM. Susan [name] has had 3 inpatient hospital visits since January 2017, but no hospital readmissions for the last 4-plusmonths.  HIPAA/ identity verified.  Call today was return call from patient after receiving multiple voice mail messages from me, following up on previous recent sick calls she had placed to me over the last 2 weeks.  Today, Susan [name] reports that she is doing "much better," stating, "I finally got to feeling better."  Patient reports that she believes she had a "bad virus or bug that had to work itself out of my system."  -- No further nausea or vomiting reported. -- reports sugars running in "300's usually."  Taking medications as prescribed. -- Spoke with PCP/ Dr. Doren Custard last week and was planning to go see PCP is not better on Monday October 30, but states, "I was better by Monday." -- confirms that she is continuing to check her blood sugars and weights daily. -- voiced plans to attend [facility] provider appointment on Monday March 31, 2016 and "eye doctor" on Tuesday April 01, 2016. -- We confirmed our previously scheduled home visit for Thursday April 03, 2016.  We discussed [name] CM patient responsibilities today, and Susan [name] verbally expressed that she wished to continue [place] services.  Susan [name] denies further concerns, questions, issues, or problems today.   Plan:   Susan [name] will continue to take all of her medications as they are prescribed and will keep all scheduled provider appointments.  Susan [name] will call EMS for any urgent or emergent issues or concerns she experiences.Susan [name] will contact her care/ medical providers to report concerns with her blood sugars running high or low, signs and  symptoms of fluid overload in the setting of CHF, or any falls she experiences.   Susan [name] will continue to monitor and record her daily weight and blood sugars, as she has been, and will take these values to ALLprovider appointments with her.  [place] outreach to continue with scheduled [place] home visit next week.   [name], RN, BSN, [employer] [facility] Care Management  [phone]

## 2016-03-31 ENCOUNTER — Encounter (HOSPITAL_COMMUNITY): Payer: Self-pay

## 2016-03-31 ENCOUNTER — Ambulatory Visit (HOSPITAL_COMMUNITY)
Admission: RE | Admit: 2016-03-31 | Discharge: 2016-03-31 | Disposition: A | Discharge status: Home / Self Care | Payer: PPO | Source: Ambulatory Visit | Attending: Cardiology | Admitting: Cardiology

## 2016-03-31 VITALS — BP 164/88 | HR 77 | Wt 277.4 lb

## 2016-03-31 DIAGNOSIS — N183 Chronic kidney disease, stage 3 unspecified: Secondary | ICD-10-CM

## 2016-03-31 DIAGNOSIS — I130 Hypertensive heart and chronic kidney disease with heart failure and stage 1 through stage 4 chronic kidney disease, or unspecified chronic kidney disease: Secondary | ICD-10-CM | POA: Insufficient documentation

## 2016-03-31 DIAGNOSIS — I878 Other specified disorders of veins: Secondary | ICD-10-CM | POA: Diagnosis not present

## 2016-03-31 DIAGNOSIS — I5032 Chronic diastolic (congestive) heart failure: Secondary | ICD-10-CM | POA: Diagnosis not present

## 2016-03-31 DIAGNOSIS — Z87891 Personal history of nicotine dependence: Secondary | ICD-10-CM | POA: Insufficient documentation

## 2016-03-31 DIAGNOSIS — I2510 Atherosclerotic heart disease of native coronary artery without angina pectoris: Secondary | ICD-10-CM | POA: Insufficient documentation

## 2016-03-31 DIAGNOSIS — E1122 Type 2 diabetes mellitus with diabetic chronic kidney disease: Secondary | ICD-10-CM | POA: Insufficient documentation

## 2016-03-31 DIAGNOSIS — Z79899 Other long term (current) drug therapy: Secondary | ICD-10-CM | POA: Diagnosis not present

## 2016-03-31 DIAGNOSIS — Z794 Long term (current) use of insulin: Secondary | ICD-10-CM | POA: Insufficient documentation

## 2016-03-31 DIAGNOSIS — N182 Chronic kidney disease, stage 2 (mild): Secondary | ICD-10-CM

## 2016-03-31 DIAGNOSIS — E1165 Type 2 diabetes mellitus with hyperglycemia: Secondary | ICD-10-CM | POA: Insufficient documentation

## 2016-03-31 DIAGNOSIS — I429 Cardiomyopathy, unspecified: Secondary | ICD-10-CM | POA: Diagnosis not present

## 2016-03-31 DIAGNOSIS — IMO0002 Reserved for concepts with insufficient information to code with codable children: Secondary | ICD-10-CM

## 2016-03-31 LAB — BASIC METABOLIC PANEL
Anion gap: 18 — ABNORMAL HIGH (ref 5–15)
BUN: 49 mg/dL — ABNORMAL HIGH (ref 6–20)
CO2: 22 mmol/L (ref 22–32)
Calcium: 8.4 mg/dL — ABNORMAL LOW (ref 8.9–10.3)
Chloride: 83 mmol/L — ABNORMAL LOW (ref 101–111)
Creatinine, Ser: 2.13 mg/dL — ABNORMAL HIGH (ref 0.44–1.00)
GFR calc Af Amer: 27 mL/min — ABNORMAL LOW (ref >60)
GFR calc non Af Amer: 23 mL/min — ABNORMAL LOW (ref >60)
Glucose, Bld: 963 mg/dL (ref 65–99)
Potassium: 3.3 mmol/L — ABNORMAL LOW (ref 3.5–5.1)
Sodium: 123 mmol/L — ABNORMAL LOW (ref 135–145)

## 2016-03-31 LAB — BRAIN NATRIURETIC PEPTIDE: B Natriuretic Peptide: 96.3 pg/mL (ref 0.0–100.0)

## 2016-03-31 MED ORDER — CARVEDILOL 25 MG PO TABS: 25.0000 mg | ORAL_TABLET | Freq: Two times a day (BID) | ORAL | 6 refills | Status: DC

## 2016-03-31 NOTE — Patient Instructions (Signed)
INCREASE Coreg to 25 mg, one tab twice a day You may take two 12.5 mg tabs twice a day until you run out, then go get new bottle  Labs today We will only contact you if something comes back abnormal or we need to make some changes. Otherwise no news is good news! Marland Kitchen  Your physician recommends that you schedule a follow-up appointment in: 4 weeks with [name],NP

## 2016-03-31 NOTE — Progress Notes (Signed)
Patient ID: Susan [name], female   DOB: 1950-07-30, 65 y.o.   MRN: [id number]   PCP: Dr Edilia Bo  Cardiologist: Dr Myrtis Ser  Endocrinologist: Dr Lurene Shadow  Primary HF Cardiologist: Dr. Gala Romney   HPI: Susan [name] is a 65 year old with a history of RBBB, DM, Hypothyroid, diastolic heart failure, CAD MI 2000/2007 BMS 2007, TIA, obesity.   Admitted 06/07/15 with N,V, hyperglycemia. Creatinine was up to 1.9. On the day discharge creatinine was back down to 1.2 Thought to be dehydrated so diuretics were held. Diuretics restarted on the day discharge. Discharge weight 282 pounds.   Admitted 2/13 through 07/13/15 with gastroenteritis and hyper glycemia. Diuretic initially held due to dehydration. Losartan and metolazone stopped with AKI/CKD. Discharge weight was 283 pounds.   Admitted June 1st through June 3rd with altered mental status, hypothermia, and weakness. Diuretics initially held and later restarted. Unfortunately when she was discharged she refused home health.  She returns today for regular follow up. Overall feeling fair. Appetite poor. Said she is not eating much. Weight at home 276-280 pounds. Denies PND/Orthopnea. Remains SOB with exertion. Taking all medications. [name] comes out monthly.   ECHO 07/11/2015- 50-55%.  Grade I DD  RHC 09/22/13: RA = 4  RV = 30/5/7  PA = 25/10 (16)  PCW = 7  Fick cardiac output/index = 6.3/2.7  PVR = 1.5 WU   FA sat = 93%  PA sat = 61%, 66%   Labs: 07/18/13 K 4.8 Creatinine 1.38 Glucose 1005 she refused to go to [facility] Labs : 08/17/13 K 3.9 Creatinine 1.4 Glucose 499 Labs: 09/29/13 K 4.5 Creatinine 1.4  Labs 04/18/14 K 4.4 Creatinine 1.18 Labs 03/26/2015: K 5.3 Creatinine 1.41  Labs 05/01/2015: K 4.9 Creatinine 1.8 Labs 05/04/2015: K 5.0 Creatinine 1.74  Labs 06/10/2015: K 4.7 Creatinine 1.21 Labs 06/12/2015: K 6.3 Creatinine 2.01  Labs 06/19/2015: K 4.5 Creatinine 1.29  Labs 07/10/2015: Hgb A1C 9.5  Labs 07/12/2015: K 4.4 Creatinine 1.62 K 4.4    Labs 08/10/2015: K 5.3 Creatinine 1.44  Labs 10/03/2015: K 4.5 Creatinine 1.41  Labs 10/19/15: K 4.7 creatinine 1.75  Labs 10/28/2015: K 4.5 Creatinine 1.2  Labs 03/10/2016: K 3.7 Creatinine 2.04 Glucose 630    SH: Former smoker quit 15 year ago. Does drink alcohol. She is disabled. Lives at home with her husband and disabled son - Husband is truck [employer] doctor. Son has a brain injury after he attempted suicide  A few years ago. .    FH: Mom MI  ROS: All systems negative except as listed in HPI, PMH and Problem List.  Past Medical History:  Diagnosis Date  . Acute MI 1999; 2007  . Anemia    hx  . Anginal pain (HCC)   . Anxiety   . Arthritis    "generalized" (03/15/2014)  . CAD (coronary artery disease)    MI in 2000 - MI  2007 - treated bare metal stent (no nuclear since then as 9/11)  . Carotid artery disease (HCC)   . CHF (congestive heart failure) (HCC)   . Chronic diastolic heart failure (HCC)    a) ECHO (08/2013) EF 55-60% and RV function nl b) RHC (08/2013) RA 4, RV 30/5/7, PA 25/10 (16), PCWP 7, Fick CO/CI 6.3/2.7, PVR 1.5 WU, PA 61 and 66%  . Daily headache    "~ every other day; since I fell in June" (03/15/2014)  . Depression   . Dyslipidemia   . Exertional shortness of breath   . HTN (  hypertension)   . Hypothyroidism   . Obesity   . Osteoarthritis   . Peripheral neuropathy (HCC)   . PONV (postoperative nausea and vomiting)   . RBBB (right bundle branch block)    Old  . Renal insufficiency    DENIES  . Stroke [facility])    mini strokes  . Syncope    likely due to low blood sugar  . Tachycardia    Sinus tachycardia  . Type II diabetes mellitus (HCC)   . Urinary incontinence   . Venous insufficiency     Current Outpatient Prescriptions  Medication Sig Dispense Refill  . albuterol (PROVENTIL HFA;VENTOLIN HFA) 108 (90 BASE) MCG/ACT inhaler Inhale 2 puffs into the lungs every 6 (six) hours as needed for wheezing or shortness of breath. Reported on 10/30/2015    . aspirin EC  81 MG tablet Take 81 mg by mouth daily.    . carvedilol (COREG) 12.5 MG tablet Take 1.5 tablets (18.75 mg total) by mouth 2 (two) times daily with a meal. 90 tablet 6  . clonazePAM (KLONOPIN) 1 MG tablet TAKE 1 TABLET BY MOUTH 3 TIMES A DAY 90 tablet 1  . clopidogrel (PLAVIX) 75 MG tablet TAKE 1 TABLET (75 MG TOTAL) BY MOUTH DAILY WITH BREAKFAST. 90 tablet 2  . ferrous sulfate 325 (65 FE) MG tablet Take 325 mg by mouth daily with breakfast. Reported on 08/21/2015    . insulin NPH-regular Human (NOVOLIN 70/30) (70-30) 100 UNIT/ML injection Inject 50-60 Units into the skin 2 (two) times daily with a meal. Inject 50 units in the morning and 60 units in the afternoon    . isosorbide dinitrate (ISORDIL) 10 MG tablet TAKE 1 TABLET (10 MG TOTAL) BY MOUTH 2 (TWO) TIMES DAILY. 60 tablet 10  . levothyroxine (SYNTHROID, LEVOTHROID) 50 MCG tablet Take 50 mcg by mouth daily before breakfast.    . losartan (COZAAR) 50 MG tablet TAKE 1 TABLET BY MOUTH DAILY 90 tablet 0  . metFORMIN (GLUMETZA) 500 MG (MOD) 24 hr tablet Take 500 mg by mouth 2 (two) times daily with a meal.    . metolazone (ZAROXOLYN) 2.5 MG tablet Take 2.5 mg by mouth daily as needed. Weight gain/shortness of breath/swelling    . nitroGLYCERIN (NITROSTAT) 0.4 MG SL tablet Place 1 tablet (0.4 mg total) under the tongue every 5 (five) minutes as needed for chest pain. 25 tablet 3  . ONETOUCH VERIO test strip     . PARoxetine (PAXIL-CR) 25 MG 24 hr tablet TAKE 1 TABLET BY MOUTH EVERY DAY 30 tablet 1  . pregabalin (LYRICA) 150 MG capsule Take 150 mg by mouth 3 (three) times daily. Reported on 10/31/2015    . torsemide (DEMADEX) 20 MG tablet Take 80 mg (4 tabs) in am and 40 mg (2 tabs) in pm 180 tablet 3   No current facility-administered medications for this encounter.     Vitals:   03/31/16 1352  BP: (!) 164/88  Pulse: 77  SpO2: 98%  Weight: 277 lb 6.4 oz (125.8 kg)   Wt Readings from Last 3 Encounters:  03/31/16 277 lb 6.4 oz (125.8 kg)    03/10/16 290 lb (131.5 kg)  02/26/16 287 lb 12.8 oz (130.5 kg)     PHYSICAL EXAM: General:  Chronically ill appearing. Ambulated into the clinic without difficulty     HEENT: normal Neck: supple. JVP difficult to assess due to body habitus.      Carotids 2+ bilaterally; no bruits. No thyromegaly or nodule noted.  Cor: PMI normal. RRR. No M/G/R Lungs: decreased in the bases.  Abdomen: obese, soft, NT, ND, no HSM. No bruits or masses. +BS  Extremities: no cyanosis, clubbing, rash, BLE trace edema.  Chronic venous stasis changes with several weeping spots.  Neuro: alert & orientedx3, cranial nerves grossly intact. Moves all 4 extremities w/o difficulty. Affect pleasant.     ASSESSMENT & PLAN: 1. Chronic Diastolic Heart Failure: NICM, EF 55-60% Grade II DD(06/2015 )  -NYHA III symptoms. Volume status stable.   - Continue torsemide 80 mg in am and 40 mg in pm. Off potassium now with recent high K.  BMET/BNP today.     - Continue losartan 50 mg daily. - Increase coreg  25 mg twice a day.   - Reinforced importance of daily weights, low sodium diet, and fluid restriction. Instructed to call the HF clinic if weight increases more than 3 lbs overnight or 5 lbs in a week 2.. Dyspnea - Stable today. Use inhalers as needed. Has refused sleep studies/CPAP. 3. Uncontrolled DM:   - Seeing Dr. Lurene Shadow. Has been more stable on recent checks.  4. CAD - No CP or ACS symptoms.  - Continue ASA 81 mg daily. 5. Obesity: - Again reviewed portion control and importance of weight loss.   6. HTN - Elevated.   7. CKD Stage III   Check BMET  8. Day time fatigue - Continues to refuse sleep study.  9. Chronic Venous Stasis - Have recommended she wrap her legs. She states she has done it before and is comfortable to do again.   Follow up in 4 weeks. I have asked her to bring all medications to her appointment. I will contact [facility] with an update.   BMEt today. Glucose> 900. She was called and  instructed to go to the ED for further evaluation.   Mckenley Birenbaum NP-C   2:02 PM

## 2016-04-02 DIAGNOSIS — E113413 Type 2 diabetes mellitus with severe nonproliferative diabetic retinopathy with macular edema, bilateral: Secondary | ICD-10-CM | POA: Diagnosis not present

## 2016-04-02 DIAGNOSIS — H3582 Retinal ischemia: Secondary | ICD-10-CM | POA: Diagnosis not present

## 2016-04-03 ENCOUNTER — Ambulatory Visit: Payer: Self-pay | Admitting: *Deleted

## 2016-04-03 ENCOUNTER — Other Ambulatory Visit: Payer: Self-pay | Admitting: *Deleted

## 2016-04-03 ENCOUNTER — Encounter: Payer: Self-pay | Admitting: *Deleted

## 2016-04-03 NOTE — Patient Outreach (Signed)
[facility]) Care Management [place] Telephone Outreach 04/03/2016  Susan [name] May 09, 1951 [id number]  Received voice mail message this morning from [facility], 65 y.o.femalewell known to [place] for for self-management of chronic disease states of CHF and DM. Susan [name] has had 3 inpatient hospital visits since January 2017, but no hospital readmissions for the last 5-plusmonths.  I returned Susan [name]'s call this morning and had successful telephone call with patient.  IPAA/ identity verified with patient over phone call today.  Call today from patient was to warn me about coming to scheduled in-home visit planned for later this morning; [name], and the road is unsafe for travel in rainy/ wet weather.  Susan [name] would like to re-schedule this visit, as she is concerned about my ability to traverse the road today.  In- home visit was re-scheduled for Monday April 07, 2016, as patient stated that the road typically takes 2-3 days to dry out for safe travel with several days of rain.  Today, Susan [name] reports that she is continuing to feel "much better," after her recent period of feeling sick. During our 42 minute phone call, Susan [name] reports:  -- no further nausea or vomiting. -- sugars running in "200-300 for last 2 days;"  has all medications and is taking all as prescribed. -- confirms that she is continuing to check her blood sugars and weights daily; reports weights of 275-276 pounds without drastic changes. -- attended [facility] provider appointment on Monday March 31, 2016 where carvidolol dosing was increased to 25 mg BID.  Susan [name] reports that after this appointment, she was notified by [facility] to go to ED for lab blood sugar result > 900.  Susan [name] reported that she did not go to ED, as she "felt okay, just thirsty."  Susan [name] reported that her blood sugar levels on Monday 03/31/16 and Tuesday 04/01/16 registered "high" on her home  glucose monitor, but returned to 190 on Wednesday morning 04/02/16 and 220 in afternoon.  Susan [name] has not yet checked her blood glucose this morning, but reports that she "is feeling okay, just a little thirsty."  On the phone, Susan [name] does not sound to be distressed, her speech is clear and she is A/O x 4 and appropriate in conversation. -- attended "eye doctor" on Tuesday April 01, 2016, where she was told that she has increased retinal swelling, presumably from consistently high blood sugar readings. -- has scheduled office visit with PCP on Monday April 07, 2016 at 3:30 pm and endocrinology provider April 14, 2016 at 3:30 pm -- admits to feeling tired and weak, states is using walker to assist with ambulation around home when she feels tired and weak, in an effort to prevent falls. -- breathing "is about the same."  Susan [name] denies chest pain today as well as shortness of breath above her baseline.  Today, we discussed sick day rules for DM and I offered to attend upcoming endocrinology provider appointment with patient, to clarify patient's individual sick day plan for hyperglycemic days, as provider is not in EMR.  Susan [name] verbalizes that she has followed endocrinology guidelines provided to her in Spring of 2017 to prevent the hypoglycemia she was experiencing at that time, and is now confused about how to take care of herself when her sugars register high on her glucose monitor, as she does not want to experience hypoglycemic episodes again.  We discussed signs and symptoms of both hypo- and hyper- glycemia, as well  as acute complications of both.  We again discussed/ reinforced reasons patient should contact her medical providers or call EMS, and Susan [name] verbalized understanding and agreement.  Susan [name] denies further concerns, questions, issues, or problems today.   Plan:  Susan [name] will continue to take all of her medications as they are prescribed and will keep all scheduled provider  appointments.  Susan [name] will call EMS for any urgent or emergent issues or concerns she experiences.Susan [name] will contact her care/ medical providers to report concerns with her blood sugars running high or low, signs and symptoms of fluid overload in the setting of CHF, or any falls she experiences.   Susan [name] will continue to monitor and record her daily weight and blood sugars, as she has been, and will take these values to ALLprovider appointments with her.  Susan [name] will consider [facility] RN CM attending upcoming endocrinology appointment with her to assist in developing plan for self-health management of DM when blood sugars are high.  [place] outreach to continue with scheduled [place] home visit next week.  [name], RN, BSN, [employer] [facility] Care Management  [phone]

## 2016-04-07 ENCOUNTER — Encounter: Payer: Self-pay | Admitting: *Deleted

## 2016-04-07 ENCOUNTER — Other Ambulatory Visit: Payer: Self-pay | Admitting: *Deleted

## 2016-04-07 ENCOUNTER — Encounter: Payer: Self-pay | Admitting: Physician Assistant

## 2016-04-07 ENCOUNTER — Ambulatory Visit (INDEPENDENT_AMBULATORY_CARE_PROVIDER_SITE_OTHER): Payer: PPO | Admitting: Physician Assistant

## 2016-04-07 VITALS — BP 124/62 | HR 74 | Temp 97.3°F | Resp 18 | Wt 290.0 lb

## 2016-04-07 DIAGNOSIS — F411 Generalized anxiety disorder: Secondary | ICD-10-CM | POA: Diagnosis not present

## 2016-04-07 DIAGNOSIS — F329 Major depressive disorder, single episode, unspecified: Secondary | ICD-10-CM

## 2016-04-07 DIAGNOSIS — F32A Depression, unspecified: Secondary | ICD-10-CM

## 2016-04-07 MED ORDER — PAROXETINE HCL ER 25 MG PO TB24: 25.0000 mg | ORAL_TABLET | Freq: Every day | ORAL | 5 refills | Status: DC

## 2016-04-07 NOTE — Progress Notes (Signed)
Patient ID: Susan [name] MRN: [id number], DOB: [birth year], 65 y.o. Date of Encounter: 04/07/2016, 3:52 PM    Chief Complaint:  Chief Complaint  Patient presents with  . Anxiety  . Depression     HPI: 65 y.o. year old female is here to f/u her Anxiety/Depression.   She reports that current dose of Paxil CR is working well at controlling her anxiety and depression. It is causing no adverse effects.  She is seeing Dr. Soyla Murphy regarding her thyroid and diabetes. She is seeing heart failure clinic/cardiology regarding that. [facility] nurses are also involved and seeing patient frequently.     Home Meds:   Outpatient Medications Prior to Visit  Medication Sig Dispense Refill  . albuterol (PROVENTIL HFA;VENTOLIN HFA) 108 (90 BASE) MCG/ACT inhaler Inhale 2 puffs into the lungs every 6 (six) hours as needed for wheezing or shortness of breath. Reported on 10/30/2015    . aspirin EC 81 MG tablet Take 81 mg by mouth daily.    . carvedilol (COREG) 25 MG tablet Take 1 tablet (25 mg total) by mouth 2 (two) times daily with a meal. 60 tablet 6  . clonazePAM (KLONOPIN) 1 MG tablet TAKE 1 TABLET BY MOUTH 3 TIMES A DAY 90 tablet 1  . clopidogrel (PLAVIX) 75 MG tablet TAKE 1 TABLET (75 MG TOTAL) BY MOUTH DAILY WITH BREAKFAST. 90 tablet 2  . ferrous sulfate 325 (65 FE) MG tablet Take 325 mg by mouth daily with breakfast. Reported on 08/21/2015    . insulin NPH-regular Human (NOVOLIN 70/30) (70-30) 100 UNIT/ML injection Inject 50-60 Units into the skin 2 (two) times daily with a meal. Inject 50 units in the morning and 60 units in the afternoon    . isosorbide dinitrate (ISORDIL) 10 MG tablet TAKE 1 TABLET (10 MG TOTAL) BY MOUTH 2 (TWO) TIMES DAILY. 60 tablet 10  . levothyroxine (SYNTHROID, LEVOTHROID) 50 MCG tablet Take 50 mcg by mouth daily before breakfast.    . losartan (COZAAR) 50 MG tablet TAKE 1 TABLET BY MOUTH DAILY 90 tablet 0  . metFORMIN (GLUMETZA) 500 MG (MOD) 24 hr tablet Take 500 mg by mouth 2  (two) times daily with a meal.    . metolazone (ZAROXOLYN) 2.5 MG tablet Take 2.5 mg by mouth daily as needed. Weight gain/shortness of breath/swelling    . nitroGLYCERIN (NITROSTAT) 0.4 MG SL tablet Place 1 tablet (0.4 mg total) under the tongue every 5 (five) minutes as needed for chest pain. 25 tablet 3  . ONETOUCH VERIO test strip     . PARoxetine (PAXIL-CR) 25 MG 24 hr tablet TAKE 1 TABLET BY MOUTH EVERY DAY 30 tablet 1  . pregabalin (LYRICA) 150 MG capsule Take 150 mg by mouth 3 (three) times daily. Reported on 10/31/2015    . torsemide (DEMADEX) 20 MG tablet Take 80 mg (4 tabs) in am and 40 mg (2 tabs) in pm 180 tablet 3   No facility-administered medications prior to visit.     Allergies:  Allergies  Allergen Reactions  . Codeine Nausea And Vomiting      Review of Systems: See HPI for pertinent ROS. All other ROS negative.    Physical Exam: Blood pressure 124/62, pulse 74, temperature 97.3 F (36.3 C), temperature source Oral, resp. rate 18, weight 290 lb (131.5 kg), SpO2 96 %., Body mass index is 46.81 kg/m. General:  Obese WF. Appears in no acute distress. Neck: Supple. No thyromegaly. No lymphadenopathy. Lungs: Clear bilaterally to auscultation without wheezes,  rales, or rhonchi. Breathing is unlabored. Heart: Regular rhythm. No murmurs, rubs, or gallops. Abdomen: Soft, non-tender, non-distended with normoactive bowel sounds. No hepatomegaly. No rebound/guarding. No obvious abdominal masses. Msk:  Strength and tone normal for age. Extremities/Skin: Warm and dry. Neuro: Alert and oriented X 3. Moves all extremities spontaneously. Gait is normal. CNII-XII grossly in tact. Psych:  Responds to questions appropriately with a normal affect.     ASSESSMENT AND PLAN:  65 y.o. year old female with  1. Depression, unspecified depression type This is stable/controlled on current dose of Paxil CR. Continue current medication.  2. Generalized anxiety disorder This is  stable/controlled on current dose of Paxil CR. Continue current medication.  Routine office visit 6 months. Follow-up sooner if needed.  [address] [place], [place], [facility] 04/07/2016 3:52 PM

## 2016-04-07 NOTE — Patient Outreach (Signed)
[facility]) Care Management  [place] CM Routine Home Visit 04/07/2016  Susan [name] [birth year] [id number]  Phaedra Colgate is an 65 y.o. female well known to [place] for for self-management of chronic disease states of CHF and DM. Susan [name] has had 3 inpatient hospital visits since January 2017, but no hospital readmissions for the last 5-plusmonths.  When I arrived at her home today, Susan [name] reports that she is "in the middle of a low blood sugar episode" stating that she took her insulin without enough food in her system.  Susan [name] reports that she has just eaten a peanut butter and jelly sandwich and is now eating applesauce.  Susan [name] states that she is nervous about attending PCP appointment if her sugar "isn't exactly where it should be."    -- no further nausea or vomiting since mid-October sick episode . -- blood sugar record reviewed, over last week, from documented values has been running in 102-300 for last week. Reports "was 75" just before I arrived at her home today.  Has all medications and is taking all as prescribed.  Re-check of blood glucose after 30 minutes post-eating: 132  -- confirms that she is continuing to check her blood sugars and weights daily; reports weights of 277-284 pounds without drastic overnight changes. Taking metolazone as needed for weight gain; states that she only takes this medication "once a week." Plans to take tomorrow if weight is still > 284, states "can't take today, because I am going to see my PCP this afternoon."    -- Susan [name] is somewhat nervous about attending PCP appt, states, "I know my doctor doesn't really like me.... She rushes through our visits, talks over my head, and seems to just want me out of her office... If I have anything that isn't just right, she'll send me to the hospital."  I encouraged Susan [name] to talk to her PCP about her feelings, and to ask her any questions she had about her ongoing plan of care.   --  endocrinology provider appt scheduled for April 14, 2016 at 3:30 pm; we had previously discussed that I may join patient for visit, and Susan [name] expresses that she would like for me to attend visit, to assist in developing plan for periods of hyperglycemia.  Patient curious about treatment options such as insulin pump.  Patient verbalizes good understanding of daily insulin dosing plan to prevent hypoglycemia, but is currently taking 60 units q am (50 U ordered for am dose), and 50 units at lunch (60 U ordered for mid-day/ lunch dose).  Patient is currently not taking an evening/ night dose of insulin, but has continued taking her metformin as prescribed.    -- continues to use walker to assist with ambulation around home as needed.  Denies new falls, reports a couple of "near" falls.  -- breathing "is about the same."  Susan [name] denies chest pain today as well as shortness of breath above her baseline.  Today, we again discussed sick day rules for DM and I again offered to attend upcoming endocrinology provider appointment with patient, to clarify patient's individual sick day plan for hyperglycemic days, as provider is not in EMR, and sandy is quite agreeable to having my support during upcoming appointment.  [name] again verbalizes that she has followed endocrinology guidelines provided to her in Spring of 2017 to prevent the hypoglycemia she was experiencing at that time, and remains confused about how to take care of herself when her sugars register "high"  on her glucose monitor, as she does not want to experience hypoglycemic episodes again.  We discussed signs and symptoms of both hypo- and hyper- glycemia, as well as acute complications of both.  I provided EMMI educational materials to patient regarding DKA, and we had a long discussion of seriousness of DKA.  We again discussed/ reinforced reasons patient should contact her medical providers or call EMS, and Susan [name] verbalized understanding and  agreement.  Susan [name] denies further concerns, questions, issues, or problems today.  Subjective: "I just seem to stay so confused about my sugars... Now that I have the low's under control, I don't know what to do about the days I run high."  Objective:    BP (!) 120/58   Pulse (!) 58   Resp 18   Wt 284 lb 3.2 oz (128.9 kg)   BMI 45.87 kg/m    Review of Systems  Constitutional: Negative.   Eyes: Positive for pain.       Patient states that her (R) eye has been bothering her this morning and was swollen when she woke up; states will report to PCP during OV scheduled later today; eyes appear slightly puffy; no discharge or redness noted  Respiratory: Positive for cough and shortness of breath.        Baseline shortness of breath with activity of walking around her home; in no acute distress  Cardiovascular: Positive for leg swelling. Negative for chest pain.       Patient was dressed for upcoming PCP appt later this afternoon, and was unable to show me her legs; states, "they are swollen like they always are."  Gastrointestinal: Negative for abdominal pain, nausea and vomiting.  Genitourinary:       Reports decreased urine output on occasion; advised/ agreed to discuss with PCP during OV later today  Musculoskeletal: Negative for falls.  Neurological: Positive for headaches. Negative for dizziness.       Occasional HA reported; "slight" headache today  Psychiatric/Behavioral: Positive for depression. The patient is not nervous/anxious.     Physical Exam  Constitutional: She is oriented to person, place, and time. She appears well-developed and well-nourished. No distress.  Cardiovascular: Normal rate, regular rhythm, normal heart sounds and intact distal pulses.   Pulses:      Radial pulses are 2+ on the right side, and 2+ on the left side.  Respiratory: No respiratory distress. She has no wheezes. She has no rales.  GI: Soft. Bowel sounds are normal.  Musculoskeletal: She  exhibits edema.  Neurological: She is alert and oriented to person, place, and time.  Skin: Skin is warm and dry.  Psychiatric: She has a normal mood and affect. Her behavior is normal. Thought content normal.    Encounter Medications:   Outpatient Encounter Prescriptions as of 04/07/2016  Medication Sig Note  . albuterol (PROVENTIL HFA;VENTOLIN HFA) 108 (90 BASE) MCG/ACT inhaler Inhale 2 puffs into the lungs every 6 (six) hours as needed for wheezing or shortness of breath. Reported on 10/30/2015 02/26/2016: Has not needed to use recently  . aspirin EC 81 MG tablet Take 81 mg by mouth daily.   . carvedilol (COREG) 25 MG tablet Take 1 tablet (25 mg total) by mouth 2 (two) times daily with a meal.   . clonazePAM (KLONOPIN) 1 MG tablet TAKE 1 TABLET BY MOUTH 3 TIMES A DAY   . clopidogrel (PLAVIX) 75 MG tablet TAKE 1 TABLET (75 MG TOTAL) BY MOUTH DAILY WITH BREAKFAST.   . ferrous sulfate  325 (65 FE) MG tablet Take 325 mg by mouth daily with breakfast. Reported on 08/21/2015 09/27/2015: Taking non prescription form   . insulin NPH-regular Human (NOVOLIN 70/30) (70-30) 100 UNIT/ML injection Inject 50-60 Units into the skin 2 (two) times daily with a meal. Inject 50 units in the morning and 60 units in the afternoon 02/26/2016: Patient reports taking 60 units in am and 50 U at lunch  . isosorbide dinitrate (ISORDIL) 10 MG tablet TAKE 1 TABLET (10 MG TOTAL) BY MOUTH 2 (TWO) TIMES DAILY.   Marland Kitchen levothyroxine (SYNTHROID, LEVOTHROID) 50 MCG tablet Take 50 mcg by mouth daily before breakfast.   . losartan (COZAAR) 50 MG tablet TAKE 1 TABLET BY MOUTH DAILY   . metFORMIN (GLUMETZA) 500 MG (MOD) 24 hr tablet Take 500 mg by mouth 2 (two) times daily with a meal.   . metolazone (ZAROXOLYN) 2.5 MG tablet Take 2.5 mg by mouth daily as needed. Weight gain/shortness of breath/swelling 02/20/2016: Took one dose last Sunday   . nitroGLYCERIN (NITROSTAT) 0.4 MG SL tablet Place 1 tablet (0.4 mg total) under the tongue every 5  (five) minutes as needed for chest pain. 02/26/2016: Has not needed recently   . PARoxetine (PAXIL-CR) 25 MG 24 hr tablet TAKE 1 TABLET BY MOUTH EVERY DAY   . pregabalin (LYRICA) 150 MG capsule Take 150 mg by mouth 3 (three) times daily. Reported on 10/31/2015   . torsemide (DEMADEX) 20 MG tablet Take 80 mg (4 tabs) in am and 40 mg (2 tabs) in pm   . ONETOUCH VERIO test strip     No facility-administered encounter medications on file as of 04/07/2016.     Functional Status:   In your present state of health, do you have any difficulty performing the following activities: 02/26/2016 10/30/2015  Hearing? N N  Vision? N N  Difficulty concentrating or making decisions? Y N  Walking or climbing stairs? Y Y  Dressing or bathing? N Y  Doing errands, shopping? N N  Preparing Food and eating ? N N  Using the Toilet? N N  In the past six months, have you accidently leaked urine? Y Y  Do you have problems with loss of bowel control? N N  Managing your Medications? N N  Managing your Finances? N N  Housekeeping or managing your Housekeeping? N N  Some recent data might be hidden    Fall/Depression Screening:    PHQ 2/9 Scores 12/18/2015 10/19/2015 08/09/2015 08/07/2015 03/16/2015  PHQ - 2 Score 4 4 4 4  0  PHQ- 9 Score 13 16 16 16  0    Assessment:  Susan [name] has begun to recover from her recent period of illness, where her blood sugars were consistently running high, and reports that her blood sugars have "evened out," which is verified from review of her recorded blood sugars over the last week.   Susan [name] continues to not go to/ contact her doctors urgently when she is sick, apparently, for fear that she will be re-hospitalized. Susan [name] continues to need reinforcement of self-health management of chronic disease states of CHF and DM, as well as compliance with instructions provided to her regarding reasons to seek health care urgently/ emergently, and the complications that can result from non- compliance.  Susan [name]  does not have a strong support system within her family, and is interested in having my support at her upcoming scheduled endocrinology provider appointment.  Plan:   Susan [name] will continue to take all of her medications as they are  prescribed and will keep all scheduled provider appointments.  Susan [name] will call EMS for any urgent or emergent issues or concerns she experiences.Susan [name] will contact her care/ medical providers to report concerns with her blood sugars running high or low, signs and symptoms of fluid overload in the setting of CHF, or any falls she experiences.   Susan [name] will continue to monitor and record her daily weight and blood sugars, as she has been, and will take these values to ALLprovider appointments with her.  I will place care coordination call to endocrinology provider re: my attending upcoming endocrinology appointment with patient to assist in developing plan for self-health management of DM when blood sugars are high.  [place] outreach to continue with scheduled [place] home visit next month.  [name], RN, BSN, [employer] [facility] Care Management  [phone]

## 2016-04-08 ENCOUNTER — Other Ambulatory Visit: Payer: Self-pay | Admitting: *Deleted

## 2016-04-08 NOTE — Patient Outreach (Signed)
[facility]) Care Management [place] Telephone Outreach, Care Coordination 04/08/2016  Vernia Teem 1950-08-03 [id number]  Successful telephone outreach to [facility] at Dr. Almetta Lovely office [phone]), regarding [name], 65 y.o. female well known to [place] for for self-management of chronic disease states of CHF and DM. [name] has had 3 inpatient hospital visits since January 2017, but no hospital readmissions for the last 5-plusmonths.  During [place] in-home visit yesterday, [name] confirmed that she would like for me to accompany her to upcoming endocrinology provider appointment scheduled for Monday, April 14, 2016.  Appointment for April 14, 2016 at 3:45 pm confirmed by Estill Bamberg, who states that as long as patient is agreeable to my being at scheduled appointment, I am welcome to join in visit for patient support.  Estill Bamberg confirmed that she would communicate to Dr. [name] that I plan to attend upcoming appointment for additional patient support, as requested by patient.  Plan:  Will attend upcoming endocrinology provider appointment with patient, as she requested.  Will confirm by phone with patient later this week that I will be at endocrinology appointment next week.   [name], RN, BSN, [employer] [facility] Care Management  [phone]

## 2016-04-10 ENCOUNTER — Other Ambulatory Visit: Payer: Self-pay | Admitting: *Deleted

## 2016-04-10 ENCOUNTER — Encounter: Payer: Self-pay | Admitting: *Deleted

## 2016-04-10 NOTE — Patient Outreach (Signed)
[facility] [facility]) Care Management [place] telephone Outreach 04/10/2016  [name] 1950/10/04 [id number]  Unsuccessful telephone outreach to Susan [name], 65 y.o. female well known to [place] for for self-management of chronic disease states of CHF and DM. [name] has had 3 inpatient hospital visits since January 2017, but no hospital readmissions for the last 5-plusmonths.  Call was placed today to make patient aware that I had spoken to office staff at Dr. Almetta Lovely office, who confirmed that I could accompany patient to scheduled office visit on Monday April 14, 2016 at 3:45 pm.  HIPAA compliant voice mail message left for patient, indicating same.  Plan:  [place] outreach to continue with scheduled patient visit on Monday November 20 at 3:45 pm at Dr. Almetta Lovely office.  [name], RN, BSN, [employer] [facility] Care Management  [phone]

## 2016-04-11 ENCOUNTER — Other Ambulatory Visit: Payer: Self-pay | Admitting: Licensed Clinical Social Worker

## 2016-04-11 NOTE — Patient Outreach (Signed)
Assessment:  CSW spoke via phone with client.  CSW verified client identity. CSW and client spoke of client needs. Client sees [name], Risk analyst, as primary medical provider.  Client usually drives herself to scheduled medical appointments. She said she had her prescribed medications and was taking medications as prescribed. She attends medical appointmetns as [facility] [place]. Client receives [facility] nursing support with Reginia Naas RN.  Client has a walker and a cane to assist her in walking.  Client has 3 in 1 bedside commode to utilize in bathroom as needed.  Client sees Dr. Soyla Murphy as endocrinologist.  Client has [employer] insurance. CSW and client spoke of client care plan. CSW encouraged client to attend all scheduled client medical appointments in next 30 days. Client has history of having chest pains. She has prescribed nitroglycerin to take as needed related to chest pains. She has talked with RN Reginia Naas about client's calling 911 as needed for emergency assistance for client. Client said she is taking Paxil as prescribed by [name]. Client said she thought prescribed Paxil was helping her mood and depression symptoms. C.ient had a medical appointment on April 07, 2016 with [name].  Client is trying to attend all scheduled client medical appointments. Client has appointment with Dr. Soyla Murphy on this coming Monday. Reginia Naas, RN will accompany client to this appointment.with Dr. Soyla Murphy.  Client said she [facility] [place], [place] on 04/28/16.  She said she was not having any chest pains recently.  Client said she is using walker more and has not had any recent falls. CSW encouraged client to call CSW at [phone] as needed to discuss social work needs of client. CSW thanked Riana for phone conversation with CSW on 04/11/16.    Plan:  Client to attend all  scheduled client medical appointments in next 30 days.  CSW to call client in 4 weeks to assess client needs at that time.  [name].Isidora Laham MSW, LCSW Licensed Clinical Social Worker [facility] Care Management [phone]                  Plan:

## 2016-04-14 ENCOUNTER — Encounter: Payer: Self-pay | Admitting: *Deleted

## 2016-04-14 ENCOUNTER — Other Ambulatory Visit: Payer: Self-pay | Admitting: *Deleted

## 2016-04-14 DIAGNOSIS — E1165 Type 2 diabetes mellitus with hyperglycemia: Secondary | ICD-10-CM | POA: Diagnosis not present

## 2016-04-14 DIAGNOSIS — G609 Hereditary and idiopathic neuropathy, unspecified: Secondary | ICD-10-CM | POA: Diagnosis not present

## 2016-04-14 DIAGNOSIS — I10 Essential (primary) hypertension: Secondary | ICD-10-CM | POA: Diagnosis not present

## 2016-04-14 DIAGNOSIS — R809 Proteinuria, unspecified: Secondary | ICD-10-CM | POA: Diagnosis not present

## 2016-04-14 NOTE — Patient Outreach (Signed)
[employer] [facility]) [place] Provider Office Visit, Dr. [name], Endocrinology 04/14/2016  Susan [name] May 14, 1951 [id number]  Susan [name] is an 65 y.o. female well known to [place] for for self-management of chronic disease states of CHF and DM. Susan [name] has had 3 inpatient hospital visits since January 2017, but no hospital readmissions for the last 5-plusmonths.  Susan [name] had expressed interest in me attending endocrinology appointment today with her, as she would like for an "extra set of ears" regarding her challenges in managing her DM.  Patient has had recent episodes of significant hyperglycemia, with only infrequent hypoglycemic episodes since she has not been taking evening insulin doses.  HIPAA/ identity verified at Dr. Almetta Lovely office today.  Today patient reports:  CBG's today: -- fasting: 221 -- Before lunch: 398 -- taken during today's office visit: 122  -- Currently following previously ordered insulin regimen:  Novolin 70/30--> 60 units in am, 50 U in afternoon, No evening insulin. Metformin 500 mg BID with meals.  Susan [name] confirms that her sugars have been running "higher than usual," and Dr. [name] reviewed patient's recorded blood sugars, which patient brought with her to office visit.  Per Dr. [name]:    -- Patient has had increase in A-1C from "8.0 to 11.9 over 3 months."  Diet was reviewed with patient by Dr. [name], as well as current medications for DM.  -- Weight gain of 13 pounds since last office visit (weight today in office:  300.2 lbs).  Patient was encouraged by Dr. [name] as well as myself to contact [place] to make them aware of weight gain.  Susan [name] reports that she took her "extra" metolazone dose this morning according to weight gain guidelines per [facility] providers.  Susan [name] agrees to contact [facility] providers tomorrow to report weight gain.  -- Susan [name] is a candidate for insulin pump, but this is not covered by [employer],  and is too expensive out of pocket.  -- Metformin discontinued  -- New insulin guidelines to begin when patient has completed her current 70/30 supply, approximately on April 28, 2016:    morning insulin:  NPH 40 Units every morning   Sliding scale REGULAR insulin for every meal:  Patient is to check insulin before each and every meal, and use sliding scale REGULAR insulin as follows:     90-100 ---> 3 U   101-150 ---> 5 U   151-200 ---> 8 U   201-250 ---> 10 U   251-300 ---> 12 U   301-350 ---> 14 U   351- 400 ---> 20 U  -- Susan [name] is continue monitoring and recording blood sugars and is to visit Dr. [name] again within 3 months for follow up, and is to call for problems or questions in the interim.     -- Sick days rules: reduce insulin dosing by one half and check blood sugars frequently throughout sick days  Susan [name] appeared to understand the instructions provided by Dr. [name].  Dr. [name] had a discussion with patient about complications of running high blood sugars, including risks of DKA.   Subjective: "I can't believe the weight I have gained recently; and my sugars are running out of control."  Objective:    Review of Systems  Constitutional: Negative for chills and fever.  Respiratory: Positive for shortness of breath. Negative for cough and wheezing.        Patient has baseline shortness of breath with activity; no change from baseline noted today at  provider appointment  Cardiovascular: Positive for leg swelling. Negative for chest pain.  Gastrointestinal: Negative for abdominal pain, nausea and vomiting.  Genitourinary: Positive for frequency and urgency.       Patient reports took metolazone  Neurological: Negative.   Psychiatric/Behavioral: Positive for depression. The patient is not nervous/anxious.     Physical Exam  Constitutional: She is oriented to person, place, and time. She appears well-developed and well-nourished. No distress.  Respiratory: Effort  normal.  Neurological: She is alert and oriented to person, place, and time.  Skin: Skin is warm and dry. There is erythema.    Encounter Medications:   Outpatient Encounter Prescriptions as of 04/14/2016  Medication Sig Note  . albuterol (PROVENTIL HFA;VENTOLIN HFA) 108 (90 BASE) MCG/ACT inhaler Inhale 2 puffs into the lungs every 6 (six) hours as needed for wheezing or shortness of breath. Reported on 10/30/2015 02/26/2016: Has not needed to use recently  . aspirin EC 81 MG tablet Take 81 mg by mouth daily.   . carvedilol (COREG) 25 MG tablet Take 1 tablet (25 mg total) by mouth 2 (two) times daily with a meal.   . clonazePAM (KLONOPIN) 1 MG tablet TAKE 1 TABLET BY MOUTH 3 TIMES A DAY   . clopidogrel (PLAVIX) 75 MG tablet TAKE 1 TABLET (75 MG TOTAL) BY MOUTH DAILY WITH BREAKFAST.   . ferrous sulfate 325 (65 FE) MG tablet Take 325 mg by mouth daily with breakfast. Reported on 08/21/2015 09/27/2015: Taking non prescription form   . insulin NPH-regular Human (NOVOLIN 70/30) (70-30) 100 UNIT/ML injection Inject 50-60 Units into the skin 2 (two) times daily with a meal. Inject 50 units in the morning and 60 units in the afternoon 02/26/2016: Patient reports taking 60 units in am and 50 U at lunch  . isosorbide dinitrate (ISORDIL) 10 MG tablet TAKE 1 TABLET (10 MG TOTAL) BY MOUTH 2 (TWO) TIMES DAILY.   Marland Kitchen levothyroxine (SYNTHROID, LEVOTHROID) 50 MCG tablet Take 50 mcg by mouth daily before breakfast.   . losartan (COZAAR) 50 MG tablet TAKE 1 TABLET BY MOUTH DAILY   . metFORMIN (GLUMETZA) 500 MG (MOD) 24 hr tablet Take 500 mg by mouth 2 (two) times daily with a meal.   . metolazone (ZAROXOLYN) 2.5 MG tablet Take 2.5 mg by mouth daily as needed. Weight gain/shortness of breath/swelling 02/20/2016: Took one dose last Sunday   . nitroGLYCERIN (NITROSTAT) 0.4 MG SL tablet Place 1 tablet (0.4 mg total) under the tongue every 5 (five) minutes as needed for chest pain. 02/26/2016: Has not needed recently   . ONETOUCH  VERIO test strip    . PARoxetine (PAXIL-CR) 25 MG 24 hr tablet Take 1 tablet (25 mg total) by mouth daily.   . pregabalin (LYRICA) 150 MG capsule Take 150 mg by mouth 3 (three) times daily. Reported on 10/31/2015   . torsemide (DEMADEX) 20 MG tablet Take 80 mg (4 tabs) in am and 40 mg (2 tabs) in pm    No facility-administered encounter medications on file as of 04/14/2016.     Functional Status:   In your present state of health, do you have any difficulty performing the following activities: 02/26/2016 10/30/2015  Hearing? N N  Vision? N N  Difficulty concentrating or making decisions? Y N  Walking or climbing stairs? Y Y  Dressing or bathing? N Y  Doing errands, shopping? N N  Preparing Food and eating ? N N  Using the Toilet? N N  In the past six months,  have you accidently leaked urine? Y Y  Do you have problems with loss of bowel control? N N  Managing your Medications? N N  Managing your Finances? N N  Housekeeping or managing your Housekeeping? N N  Some recent data might be hidden    Fall/Depression Screening:    PHQ 2/9 Scores 12/18/2015 10/19/2015 08/09/2015 08/07/2015 03/16/2015  PHQ - 2 Score 4 4 4 4  0  PHQ- 9 Score 13 16 16 16  0    Assessment:  Susan [name] has continued to recover from her recent period of illness, where her blood sugars were consistently running high, but continues to experience significantly high blood sugars on a frequent basis.  Susan [name] continues to need reinforcement of self-health management of chronic disease states of CHF and DM, as well as compliance with instructions provided to her regarding reasons to seek health care urgently/ emergently, and the complications that can result from non- compliance. Susan [name] does not have a strong support system within her family, and benefits from extra support and reinforcement from her healthcare providers.  Plan:   Susan [name] will continue to take all of her medications as they are prescribed and will keep all scheduled  provider appointments.  Susan [name] will call EMS for any urgent or emergent issues or concerns she experiences.Susan [name] will contact her care/ medical providers to report concerns with her blood sugars running high or low, signs and symptoms of fluid overload in the setting of CHF, or any falls she experiences.   Susan [name] will continue to monitor and record her daily weight and blood sugars, as she has been, and will take these values to ALLprovider appointments with her.  [place] outreach to continue with scheduled [place] telephone outreach in 2 weeks and routine home visit next month.   [name], RN, BSN, [employer] [facility] Care Management  [phone]

## 2016-04-15 ENCOUNTER — Telehealth (HOSPITAL_COMMUNITY): Payer: Self-pay | Admitting: *Deleted

## 2016-04-15 MED ORDER — TORSEMIDE 20 MG PO TABS: ORAL_TABLET | ORAL | 3 refills | Status: DC

## 2016-04-15 MED ORDER — TORSEMIDE 20 MG PO TABS: 80.0000 mg | ORAL_TABLET | Freq: Two times a day (BID) | ORAL | 3 refills | Status: DC

## 2016-04-15 MED ORDER — POTASSIUM CHLORIDE CRYS ER 20 MEQ PO TBCR: 40.0000 meq | EXTENDED_RELEASE_TABLET | Freq: Every day | ORAL | 3 refills | Status: DC

## 2016-04-15 NOTE — Telephone Encounter (Signed)
Patient called in stated her weight was up 5lbs in a week and she was instructed by her pcp to give our office a call.  Patient was taking torsemide 80/40 and took a metolazone 2.5 mg yesterday. Pt said she is urinating but doesn't notice a difference when taking metolazone.  Pt has shortness of breath, and bilateral edema.  Per Darrick Grinder, NP-C patient instructed to take Torsemide 80/80 with 40 of potassium for two days.  Patient has a follow up scheduled 12/4

## 2016-04-22 ENCOUNTER — Other Ambulatory Visit: Payer: Self-pay | Admitting: Physician Assistant

## 2016-04-22 NOTE — Telephone Encounter (Signed)
Approved. # 90 + 2

## 2016-04-22 NOTE — Telephone Encounter (Signed)
Rx filled per protocol  

## 2016-04-22 NOTE — Telephone Encounter (Signed)
Ok to refill 

## 2016-04-24 ENCOUNTER — Other Ambulatory Visit: Payer: Self-pay | Admitting: *Deleted

## 2016-04-24 ENCOUNTER — Encounter: Payer: Self-pay | Admitting: *Deleted

## 2016-04-24 NOTE — Patient Outreach (Signed)
[facility] [facility]) Care Management [place] Telephone Outreach x 3 04/24/2016  [name] 1951/01/14 [id number]   Successful telephone outreach to Susan [name] 65 y.o. female well known to [place] for for self-management of chronic disease states of CHF and DM. Susan [name] has had 3 inpatient hospital visits since January 2017, but no hospital readmissions for the last 5-plusmonths.  HIPAA/ identity verified with pt. during today's phone calls.  60 minutes spent on phone with patient today to review overall plan of care.  Today, Susan [name] reports that her blood sugars "have been running better," since OV with Dr. [name] on April 14, 2016.  Susan [name] reports that her "legs are awful," they are "swollen, red and raw."   -- DM:  Susan [name] reports that she is "nervous" about starting new insulin guidelines as directed by Dr. [name] on April 14, 2016.  Confirms that she has stopped taking metformin as directed by Dr. [name].  States sugars have "been running much better," and reports blood sugar ranges "between 78-300's," with a fasting blood sugar this morning of "78."  Today, we thoroughly reviewed instructions for patient's new insulin dosing, to start on Monday April 28, 2016:  New insulin guidelines to begin on April 28, 2016:              morning insulin:  NPH 40 Units every morning              Sliding scale REGULAR insulin for every meal:  Patient is to check insulin before each and every meal, and use sliding scale REGULAR insulin as follows:                           90-100 ---> 3 U                         101-150 ---> 5 U                         151-200 ---> 8 U                         201-250 ---> 10 U                         251-300 ---> 12 U                         301-350 ---> 14 U                         351- 400 ---> 16 U   >400 --------> 20 U  Supper meal: reduce sliding scale dose by one half   Sick days rules: reduce insulin dosing by one half  and check blood sugars frequently throughout sick days   Patient verbalized that she is "scared" that her sugar will drop during night time/ sleep hours as it has in the past, patient was advised to keep food available to her at all times as she begins new insulin dosing schedule.  Patient was advised to contact me or Dr. [name] with questions/ concerns as she begins new insulin dosing, and we scheduled a phone call for Thursday May 01, 2016 to follow up on how patient is tolerating new insulin dosing.  Extensive encouragement/ positive reinforcement provided  to patient today as she begins new insulin regimen next week.  -- CHF:  Susan [name] reports that she has continued monitoring and recording daily weights; reports weight today of 292.3 lbs, denies changes in breathing status, but states that her legs "look horrible," reporting that they have increased swelling, redness, and oozing; states, "they are just raw."  Patient reports that she and her husband "wrapped legs," but states "it hasn't helped much."  Confirms that she will attend upcoming scheduled provider appointment with [facility] on Monday April 28, 2016, which voices plans to attend.  Patient encouraged to contact [facility] provider today to see if she can be seen earlier; patient states she "will consider."  Susan [name] denies further questions, concerns, issues or problems today, and I again encouraged her to contact her providers promptly for any new concerns/ questions that arise.  Plan:  Susan [name] will continue to take all of her medications as they are prescribed and will keep all scheduled provider appointments.  Susan [name] will call EMS for any urgent or emergent issues or concerns she experiences.Susan [name] will contact her care/ medical providers to report concerns with her blood sugars running high or low, signs and symptoms of fluid overload in the setting of CHF, or any falls she experiences.   Susan [name] will continue to monitor and record her daily weight  and blood sugars, as she has been, and will take these values to ALLprovider appointments with her.  [place] outreach to continue with scheduled [place] telephone outreach next week and routine home visit next month.  [name], RN, BSN, [employer] Coordinator [facility] Care Management  [phone]                                               -- Sick days rules: reduce insulin dosing by one half and check blood sugars frequently throughout sick days

## 2016-04-28 ENCOUNTER — Ambulatory Visit (HOSPITAL_COMMUNITY)
Admission: RE | Admit: 2016-04-28 | Discharge: 2016-04-28 | Disposition: A | Discharge status: Home / Self Care | Payer: PPO | Source: Ambulatory Visit | Attending: Internal Medicine | Admitting: Internal Medicine

## 2016-04-28 VITALS — BP 136/80 | HR 95 | Wt 301.6 lb

## 2016-04-28 DIAGNOSIS — I878 Other specified disorders of veins: Secondary | ICD-10-CM | POA: Insufficient documentation

## 2016-04-28 DIAGNOSIS — I2510 Atherosclerotic heart disease of native coronary artery without angina pectoris: Secondary | ICD-10-CM | POA: Insufficient documentation

## 2016-04-28 DIAGNOSIS — I429 Cardiomyopathy, unspecified: Secondary | ICD-10-CM | POA: Diagnosis not present

## 2016-04-28 DIAGNOSIS — E1122 Type 2 diabetes mellitus with diabetic chronic kidney disease: Secondary | ICD-10-CM | POA: Diagnosis not present

## 2016-04-28 DIAGNOSIS — N183 Chronic kidney disease, stage 3 unspecified: Secondary | ICD-10-CM

## 2016-04-28 DIAGNOSIS — R0601 Orthopnea: Secondary | ICD-10-CM

## 2016-04-28 DIAGNOSIS — E1165 Type 2 diabetes mellitus with hyperglycemia: Secondary | ICD-10-CM

## 2016-04-28 DIAGNOSIS — Z5189 Encounter for other specified aftercare: Secondary | ICD-10-CM | POA: Insufficient documentation

## 2016-04-28 DIAGNOSIS — I130 Hypertensive heart and chronic kidney disease with heart failure and stage 1 through stage 4 chronic kidney disease, or unspecified chronic kidney disease: Secondary | ICD-10-CM | POA: Diagnosis not present

## 2016-04-28 DIAGNOSIS — Z79899 Other long term (current) drug therapy: Secondary | ICD-10-CM | POA: Insufficient documentation

## 2016-04-28 DIAGNOSIS — I5033 Acute on chronic diastolic (congestive) heart failure: Secondary | ICD-10-CM | POA: Diagnosis not present

## 2016-04-28 DIAGNOSIS — Z794 Long term (current) use of insulin: Secondary | ICD-10-CM

## 2016-04-28 DIAGNOSIS — N182 Chronic kidney disease, stage 2 (mild): Secondary | ICD-10-CM

## 2016-04-28 DIAGNOSIS — IMO0002 Reserved for concepts with insufficient information to code with codable children: Secondary | ICD-10-CM

## 2016-04-28 DIAGNOSIS — I5032 Chronic diastolic (congestive) heart failure: Secondary | ICD-10-CM

## 2016-04-28 MED ORDER — METOLAZONE 2.5 MG PO TABS: 2.5000 mg | ORAL_TABLET | ORAL | 2 refills | Status: DC

## 2016-04-28 MED ORDER — TORSEMIDE 20 MG PO TABS: 80.0000 mg | ORAL_TABLET | Freq: Two times a day (BID) | ORAL | 3 refills | Status: DC

## 2016-04-28 MED ORDER — DOXYCYCLINE HYCLATE 50 MG PO CAPS: 100.0000 mg | ORAL_CAPSULE | Freq: Two times a day (BID) | ORAL | 0 refills | Status: AC

## 2016-04-28 NOTE — Patient Instructions (Signed)
INCREASE Torsemide to 80 mg, (4 tabs) twice a day START Metolazone 2.5 mg, one tab twice a week Tuesday and Wednesday  Your physician recommends that you schedule a follow-up appointment in:  2 weeks with [name],NP

## 2016-04-28 NOTE — Progress Notes (Signed)
Patient ID: Susan [name], female   DOB: 24-Jun-1950, 65 y.o.   MRN: [id number]   PCP: Dr Edilia Bo  Cardiologist: Dr Myrtis Ser  Endocrinologist: Dr Lurene Shadow  Primary HF Cardiologist: Dr. Gala Romney  [facility] Patient HPI: Susan [name] is a 65 year old with a history of RBBB, DM, Hypothyroid, diastolic heart failure, CAD MI 2000/2007 BMS 2007, TIA, obesity.   Admitted 06/07/15 with N,V, hyperglycemia. Creatinine was up to 1.9. On the day discharge creatinine was back down to 1.2 Thought to be dehydrated so diuretics were held. Diuretics restarted on the day discharge. Discharge weight 282 pounds.   Admitted 2/13 through 07/13/15 with gastroenteritis and hyper glycemia. Diuretic initially held due to dehydration. Losartan and metolazone stopped with AKI/CKD. Discharge weight was 283 pounds.   Admitted June 1st through June 3rd with altered mental status, hypothermia, and weakness. Diuretics initially held and later restarted. Unfortunately when she was discharged she refused home health.  She returns today for HF follow up. Last week she was evaluated by endocrinology with adjustments to insulin. Complaining of fatigue. SOB with exertion. +Orthopnea. Denies PND. Complaining of leg edema with  RLE and LLE sores. Weight at home trending up from 280-298 pounds. Says she is not drinking much fluid. Taking all medications. [name] comes out monthly.   ECHO 07/11/2015- 50-55%.  Grade I DD  RHC 09/22/13: RA = 4  RV = 30/5/7  PA = 25/10 (16)  PCW = 7  Fick cardiac output/index = 6.3/2.7  PVR = 1.5 WU   FA sat = 93%  PA sat = 61%, 66%   Labs: 07/18/13 K 4.8 Creatinine 1.38 Glucose 1005 she refused to go to [facility] Labs : 08/17/13 K 3.9 Creatinine 1.4 Glucose 499 Labs: 09/29/13 K 4.5 Creatinine 1.4  Labs 04/18/14 K 4.4 Creatinine 1.18 Labs 03/26/2015: K 5.3 Creatinine 1.41  Labs 05/01/2015: K 4.9 Creatinine 1.8 Labs 05/04/2015: K 5.0 Creatinine 1.74  Labs 06/10/2015: K 4.7 Creatinine 1.21 Labs 06/12/2015: K 6.3  Creatinine 2.01  Labs 06/19/2015: K 4.5 Creatinine 1.29  Labs 07/10/2015: Hgb A1C 9.5  Labs 07/12/2015: K 4.4 Creatinine 1.62 K 4.4  Labs 08/10/2015: K 5.3 Creatinine 1.44  Labs 10/03/2015: K 4.5 Creatinine 1.41  Labs 10/19/15: K 4.7 creatinine 1.75  Labs 10/28/2015: K 4.5 Creatinine 1.2  Labs 03/10/2016: K 3.7 Creatinine 2.04 Glucose 630  Labs 03/31/2016: K 3.3 Creatinine 2.13 Glucose 963    SH: Former smoker quit 15 year ago. Does drink alcohol. She is disabled. Lives at home with her husband and disabled son - Husband is truck Hospital doctor. Son has a brain injury after he attempted suicide  A few years ago. .    FH: Mom MI  ROS: All systems negative except as listed in HPI, PMH and Problem List.  Past Medical History:  Diagnosis Date  . Acute MI 1999; 2007  . Anemia    hx  . Anginal pain (HCC)   . Anxiety   . Arthritis    "generalized" (03/15/2014)  . CAD (coronary artery disease)    MI in 2000 - MI  2007 - treated bare metal stent (no nuclear since then as 9/11)  . Carotid artery disease (HCC)   . CHF (congestive heart failure) (HCC)   . Chronic diastolic heart failure (HCC)    a) ECHO (08/2013) EF 55-60% and RV function nl b) RHC (08/2013) RA 4, RV 30/5/7, PA 25/10 (16), PCWP 7, Fick CO/CI 6.3/2.7, PVR 1.5 WU, PA 61 and 66%  . Daily  headache    "~ every other day; since I fell in June" (03/15/2014)  . Depression   . Dyslipidemia   . Exertional shortness of breath   . HTN (hypertension)   . Hypothyroidism   . Obesity   . Osteoarthritis   . Peripheral neuropathy (HCC)   . PONV (postoperative nausea and vomiting)   . RBBB (right bundle branch block)    Old  . Renal insufficiency    DENIES  . Stroke [facility])    mini strokes  . Syncope    likely due to low blood sugar  . Tachycardia    Sinus tachycardia  . Type II diabetes mellitus (HCC)   . Urinary incontinence   . Venous insufficiency     Current Outpatient Prescriptions  Medication Sig Dispense Refill  . albuterol  (PROVENTIL HFA;VENTOLIN HFA) 108 (90 BASE) MCG/ACT inhaler Inhale 2 puffs into the lungs every 6 (six) hours as needed for wheezing or shortness of breath. Reported on 10/30/2015    . aspirin EC 81 MG tablet Take 81 mg by mouth daily.    . carvedilol (COREG) 25 MG tablet Take 1 tablet (25 mg total) by mouth 2 (two) times daily with a meal. 60 tablet 6  . clonazePAM (KLONOPIN) 1 MG tablet TAKE 1 TABLET BY MOUTH 3 TIMES A DAY 90 tablet 2  . clopidogrel (PLAVIX) 75 MG tablet TAKE 1 TABLET (75 MG TOTAL) BY MOUTH DAILY WITH BREAKFAST. 90 tablet 2  . ferrous sulfate 325 (65 FE) MG tablet Take 325 mg by mouth daily with breakfast. Reported on 08/21/2015    . insulin NPH-regular Human (NOVOLIN 70/30) (70-30) 100 UNIT/ML injection Inject 50-60 Units into the skin 2 (two) times daily with a meal. Inject 50 units in the morning and 60 units in the afternoon    . isosorbide dinitrate (ISORDIL) 10 MG tablet TAKE 1 TABLET (10 MG TOTAL) BY MOUTH 2 (TWO) TIMES DAILY. 60 tablet 10  . levothyroxine (SYNTHROID, LEVOTHROID) 50 MCG tablet Take 50 mcg by mouth daily before breakfast.    . losartan (COZAAR) 50 MG tablet TAKE 1 TABLET BY MOUTH DAILY 90 tablet 0  . nitroGLYCERIN (NITROSTAT) 0.4 MG SL tablet Place 1 tablet (0.4 mg total) under the tongue every 5 (five) minutes as needed for chest pain. 25 tablet 3  . ONETOUCH VERIO test strip     . PARoxetine (PAXIL-CR) 25 MG 24 hr tablet Take 25 mg by mouth daily. Take with 25 mg tablet daily (total 62.5 mg)    . PARoxetine (PAXIL-CR) 37.5 MG 24 hr tablet Take 37.5 mg by mouth daily. Take with 25 mg tablet daily (total 62.5 mg)    . pregabalin (LYRICA) 150 MG capsule Take 150 mg by mouth 3 (three) times daily. Reported on 10/31/2015    . torsemide (DEMADEX) 20 MG tablet Take 80 mg (4 tabs) in am and 40 mg (2 tabs) in pm 180 tablet 3  . metolazone (ZAROXOLYN) 2.5 MG tablet Take 2.5 mg by mouth daily as needed. Weight gain/shortness of breath/swelling     No current  facility-administered medications for this encounter.     There were no vitals filed for this visit. Wt Readings from Last 3 Encounters:  04/28/16 (!) 301 lb 9.6 oz (136.8 kg)  04/07/16 290 lb (131.5 kg)  04/07/16 284 lb 3.2 oz (128.9 kg)     PHYSICAL EXAM: General:  Chronically ill appearing. Ambulated into the clinic without difficulty     HEENT: normal Neck:  supple. JVP difficult to assess due to body habitus.      Carotids 2+ bilaterally; no bruits. No thyromegaly or nodule noted. Cor: PMI normal. RRR. No M/G/R Lungs: decreased in the bases.  Abdomen: obese, soft, NT, ND, no HSM. No bruits or masses. +BS  Extremities: no cyanosis, clubbing, rash, RLE and LLE 2+  LLE partial thickness wounds   Chronic venous stasis changes with several weeping spots.  Neuro: alert & orientedx3, cranial nerves grossly intact. Moves all 4 extremities w/o difficulty. Affect pleasant.     ASSESSMENT & PLAN: 1. Acute/Chronic Diastolic Heart Failure: NICM, EF 55-60% Grade II DD(06/2015 )  -NYHA III symptoms. Volume status elevated. Weight up over 10 pounds.  Increase torsemide 80 mg twice a day. Add 2.5 mg metolazone Tuesday and Wednesday.  -Off potassium now with recent high K.   - Continue losartan 50 mg daily. - Continue coreg  25 mg twice a day.   - Reinforced importance of daily weights, low sodium diet, and fluid restriction. Instructed to call the HF clinic if weight increases more than 3 lbs overnight or 5 lbs in a week 2.. Dyspnea - Stable today. Use inhalers as needed. Has refused sleep studies/CPAP. 3. Uncontrolled DM:   - Seeing Dr. Lurene Shadow. Has been more stable on recent checks.  4. CAD - No CP or ACS symptoms.  - Continue ASA 81 mg daily. 5. Obesity: - Again reviewed portion control and importance of weight loss.   6. HTN - Improved today   7. CKD Stage III 8. Day time fatigue - Continues to refuse sleep study.  9. Chronic Venous Stasis 10. LLE - Cellulitis and lower extremity  wounds. Add compression wraps. Start doxycycline 100 mg twice a day.  -  Refer to [facility] for RLE and LLE leg wraps and diuretic protocol. I will follow up with [facility]. Needs close follow up. Follow up in 2 weeks.    Teffany Blaszczyk NP-C   1:59 PM

## 2016-04-28 NOTE — Progress Notes (Signed)
Advanced Heart Failure Medication Review by a Pharmacist  Does the patient  feel that his/her medications are working for him/her?  yes  Has the patient been experiencing any side effects to the medications prescribed?  no  Does the patient measure his/her own blood pressure or blood glucose at home?  no   Does the patient have any problems obtaining medications due to transportation or finances?   no  Understanding of regimen: good Understanding of indications: good Potential of compliance: good Patient understands to avoid NSAIDs. Patient understands to avoid decongestants.  Issues to address at subsequent visits: None    Pharmacist comments:  Susan [name] is a pleasant 65 yo F presenting with her medication bottles. She reports good compliance with her regimen and did not have any specific medication-related questions or concerns for me at this time.   Susan [name]. Susan [name], PharmD, BCPS, CPP Clinical Pharmacist Pager: [phone] Phone: [phone] 04/28/2016 2:08 PM      Time with patient: 10 minutes Preparation and documentation time: 2 minutes Total time: 12 minutes

## 2016-04-29 NOTE — Addendum Note (Signed)
Encounter addended by: Kerry Dory, CMA on: 04/29/2016  9:43 AM<BR>    Actions taken: Visit diagnoses modified, Order list changed, Diagnosis association updated

## 2016-05-01 ENCOUNTER — Other Ambulatory Visit: Payer: Self-pay | Admitting: *Deleted

## 2016-05-01 ENCOUNTER — Other Ambulatory Visit (HOSPITAL_COMMUNITY): Payer: Self-pay | Admitting: Cardiology

## 2016-05-01 ENCOUNTER — Encounter: Payer: Self-pay | Admitting: *Deleted

## 2016-05-01 DIAGNOSIS — I509 Heart failure, unspecified: Secondary | ICD-10-CM

## 2016-05-01 DIAGNOSIS — L03119 Cellulitis of unspecified part of limb: Secondary | ICD-10-CM

## 2016-05-01 NOTE — Progress Notes (Signed)
Another order placed to begin home health for chf management, in home diuretic protocol, and BLE cellulitis management.  Original order placed and in home care has not begun. Patient may be assigned to [facility] [phone]

## 2016-05-01 NOTE — Patient Outreach (Signed)
[place] [facility]) Care Management [place] Telephone Outreach, Care Coordination 05/01/2016  Susan [name] Sep 28, 1950 [id number]  Successful telephone outreach to "Susan [name]" at [facility]) at [phone] to confirm Home health [facility]) services for patient [name], [name] y.o.femalewell known to [place] for for self-management of chronic disease states of CHF and DM.   I explained to Susan [name] that during phone call to patient today, patient/ EMR confirmed that [facility] services for leg wrapping/ diuretic protocol had been ordered by [facility] provider on Monday April 28, 2016, but stated that she had not yet heard from [facility] agency for services to begin.    Susan [name] reported that as of today, [facility] agency had not received order for services.  Plan:  Will follow up with [facility] [place] provider to confirm order for [facility] services through [place] for leg wrapping/ diuretic protocol  [name], RN, BSN, [place] Care Management  [phone]

## 2016-05-01 NOTE — Patient Outreach (Signed)
[place] [facility]) Care Management [place] Telephone Outreach 05/01/2016  Susan [name] [birth year] [id number]  Successful telephone outreach to Susan [name] y.o.femalewell known to [place] for for self-management of chronic disease states of CHF and DM. Susan [name] has had 3 inpatient hospital visits since January 2017, but no hospital readmissions for the last 6-plusmonths. HIPAA/ identity verified with pt. during today's phone calls.    Today, Susan [name] reports that her blood sugars "have been running okay," since OV with Dr. [name] on April 14, 2016.  Susan [name] again reports that her "legs are awful," they are "swollen, red and raw."  Susan [name] reports that she had a dental root canal yesterday, and her mouth is "a bit sore."  -- DM:  Susan [name] reports that she has started new insulin guidelines as directed by Dr. [name] on April 14, 2016.  States sugars have "been running okay but a little high," and reports blood sugar ranges "between 100-300's," with a fasting blood sugar this morning of "315."  Mid-day glucose today was "141."  Today, we again thoroughly reviewed instructions for patient's new insulin dosing, which started on Monday April 28, 2016:  Susan [name] verbalized a good understanding of the new insulin dosing today, and reports no episodes of hypoglycemia.  Extensive encouragement/ positive reinforcement provided to patient today as she becomes used to new insulin regimen.  -- CHF:  Susan [name] reports that she has continued monitoring and recording daily weights; reports weight today of 297.6 lbs, denies changes in breathing status, but states that her legs "still look horrible," reporting that they have increased swelling, redness, and oozing; states, "they are just raw."  Patient reports that she attended provider appointment with [facility] on Monday April 28, 2016, at which time she was placed on antibiotics for her legs.  Susan [name] reports that she has/ is taking antibiotic  (Doxycycline 100 mg BID x 7 days) as prescribed.  Patient also confirms that her normal diuretic dosing was increased:  Toresemide is now 80 mg BID and Metolazone is now 2.5 mg on Tuesday and Wednesday.  Patient reports compliance with new diuretic dosing.   Patient also confirms that during [facility] appointment, order was placed for Home Health [facility]) through [place] for leg wrapping, although patient has not heard yet from [facility] agency.  I shared with patient that I would follow up on [place] status and would return a call to her to follow up as soon as I had more information.   Susan [name] denies further questions, concerns, issues or problems today, and I again encouraged her to contact her providers promptly for any new concerns/ questions that arise.  We confirmed [name] in-home visit scheduled for next week.  Plan:  Susan [name] will continue to take all of her medications as they are prescribed and will keep all scheduled provider appointments.  Susan [name] will call EMS for any urgent or emergent issues or concerns she experiences.Susan [name] will contact her care/ medical providers to report concerns with her blood sugars running high or low, signs and symptoms of fluid overload in the setting of CHF, or any falls she experiences.   Susan [name] will continue to monitor and record her daily weight and blood sugars, as she has been, and will take these values to ALLprovider appointments with her.  [place] outreach to continue with scheduled [place] routine home visit next week.  I will place call today to [place] to follow up on order status from order placed  on Monday December 4, 2-17 by [facility] provider.   [name], RN, BSN, [employer] [facility] Care Management  [phone]

## 2016-05-01 NOTE — Patient Outreach (Signed)
[place] [facility]) Care Management [place] Telephone Outreach x 2, Care coordination Office visit to [facility], Care coordination 05/01/2016  Ladasha Schnackenberg March 14, 1951 [id number]  Multiple unsuccessful telephone attempts to [facility] to follow up on order for Home health [facility]) services ordered by Darrick Grinder, NP for patient Bentley Haralson y.o.femalewell known to [place] for for self-management of chronic disease states of CHF and DM. With each telephone attempt, phone rang without pick up.  I went to [facility] to make them aware that [place] reported to me earlier today that no order had been received for patient for [facility] services for leg wrapping and diuretic protocol; spoke with [place], [facility] assistant, who confirmed through Darrick Grinder, NP that ordered had been placed on Monday April 28, 2016, as previously reported by patient and verified in EMR.  Chantel stated that they would again place order for [facility] services, and I provided [place] phone number for [facility] to Chantel.  Plan:  Will update patient on care coordination efforts to ensure [facility] services are initiated.  [name], RN, BSN, [employer] [facility] Care Management  [phone]

## 2016-05-01 NOTE — Patient Outreach (Signed)
[place] [facility]) Care Management [facility] Telephone Outreach, Care Coordination update to patient 05/01/2016  Susan [name] 03-12-1951 [id number]  Unsuccessful telephone outreach to Susan [name] y.o.femalewell known to [name] for for self-management of chronic disease states of CHF and DM. [name] has had 3 inpatient hospital visits since January 2017, but no hospital readmissions for the last 6-plusmonths.   HIPAA compliant voice mail message left for patient updating on efforts taken this afternoon after our previous phone call today regarding status of [name] services.   Plan:  [name] in-home visit scheduled for next week.  [name], RN, BSN, [employer] [facility] Care Management  [phone]

## 2016-05-06 ENCOUNTER — Other Ambulatory Visit: Payer: Self-pay | Admitting: *Deleted

## 2016-05-06 ENCOUNTER — Encounter: Payer: Self-pay | Admitting: *Deleted

## 2016-05-06 NOTE — Patient Outreach (Signed)
[place] [facility]) Care Management  [place] CM Routine Home Visit Care Coordination telephone outreach x 2 05/06/2016  Zhana Jeangilles 1951-05-21 [id number]  Katrinna Travieso is an 65 y.o. female well known to [place] for for self-management of chronic disease states of CHF and DM. Susan [name] has had 3 inpatient hospital visits since January 2017, but no hospital readmissions for the last 6-plusmonths. HIPAA/ identity verified with patient in person today.  Today, Susan [name] reports that she is doing "about the same."  -- DM: Susan [name] reports that she has continued new insulin guidelines as directed by Dr. [name] on April 14, 2016. Started new insulin dosing on April 28, 2016.  General range of recorded blood sugars since April 28, 2016:  148-507 Morning fasting range since April 28, 2016:  184-498 Evening post-prandial range since April 28, 2016:  152-507  Reviewed new insulin guidelines thoroughly with patient and had long discussion about new dosing orders.  Patient reports that she has NOT been cutting her evening dose of sliding scale (regular) insulin in half, as directed by Dr. [name] on April 14, 2016.  I will make Dr. [name] aware of new sugar ranges and follow up as needed afterward.  [name] verbalized a good understanding of the new insulin dosing today, and reports no episodes of hypoglycemia. Extensive encouragement/ positive reinforcement provided to patient today as she becomes used to new insulin regimen.  Sick day rules educational material was provided/ discussed with patient today.  -- CHF: Susan [name] reports that she has continued monitoring and recording daily weights; reports weight today of 281.0 lbs (patient's scales at home), denies changes in breathing status, but states that her legs "still look horrible," reporting that they have continued swelling, and oozing. General weight range from patient's home scales reviewed with patient, from December  4-12, 2017, weight has ranged from 281-290.3 lbs.  Patient reports that she has completed antibiotics this morning which were prescribed on April 28, 2017, for leg swelling/ cellulitis (Doxycycline 100 mg BID x 7 days); patient reports took antibiotic as prescribed.    Patient also confirms that her normal diuretic dosing was increased during [facility] provider appointment April 28, 2016:  Toresemide is now 80 mg BID and Metolazone is now 2.5 mg on Tuesday and Wednesday.  Patient reports good understanding of/ compliance with new diuretic dosing.   Patient again confirmed that during [facility] appointment April 28, 2016 order was placed for Home Health [facility]) through [facility]) for leg wrapping/ diuretic protocol, and reports that she has still not heard from yet from [facility] agency for services.  I placed care coordination call to [facility] [phone]) and spoke with "Tammy" who again stated that there was no order for services, despite efforts taken last week to facilitate start of service.  I then placed second care coordination call to [facility] [phone]) and spoke with Nira Conn, who confirmed that [facility] had been sent order on both April 28, 2016 and then again on May 01, 2016.  Nira Conn confirmed that she would follow up on order status and update patient asap.  Garment/textile technologist of CHF provided and reviewed with patient today.  -- Medications:  All medications were reviewed with patient today.  Patient verbalizes good general understanding of medication purpose, dosing and scheduling of medications.  Reports that she has been taking (2) carvedilol 25 mg BID (total dose of 50 mg BID); confirmed through EMR provider order for total dose of 25  mg BID, and patient was able to repeat correct dosing of this medication and stated that she would begin taking this medication as ordered by [facility] provider.  -- Safety:  Patient reports using  walker as needed due to pain in her bilateral LE from excessive swelling.  No new falls reported today.  -- Provider appointments:  All upcoming provider appointments were reviewed with patient today, and patient confirms that she is aware of all provider appointments and plans to attend all as scheduled.  Susan [name] denies further questions, concerns, issues or problems today, and I again encouraged her to contact her providers promptly for any new concerns/ questions that arise.  We scheduled [name] in-home visit scheduled for next month.  Subjective: "I am doing about the same, I can't seem to make any real progress.  When one thing gets under control, something else goes wrong."  Objective:    BP 122/64   Pulse 80   Resp 16   Wt 281 lb (127.5 kg) Comment: patient's home scales  SpO2 95%   BMI 45.35 kg/m    Review of Systems  Constitutional: Positive for malaise/fatigue. Negative for fever.  Respiratory: Positive for shortness of breath. Negative for cough, sputum production and wheezing.        Baseline SOB noted today; none present at rest  Cardiovascular: Positive for leg swelling. Negative for chest pain.       See skin assessment/ ROS  Gastrointestinal: Negative for abdominal pain and nausea.  Genitourinary: Positive for frequency and urgency.       Patient attributes to diuretic therapy  Musculoskeletal: Negative for falls.  Skin:       Bilateral LE from knee to ankle are red/ erythematous, with scabbed over areas on bilateral LE/ shins.  There is one open skin tear on (L) anterior leg, mid-shin, approximately 1 inch in diameter, and below that, a small ~1 cm oozing area, serous fluid.  Patient has bilateral LE edema, (L) > (R); (L) LE +3 edema, (R) LE +2 edema.  Bilateral feet are +2 edematous   Psychiatric/Behavioral: The patient is not nervous/anxious.     Physical Exam  Constitutional: She is oriented to person, place, and time. She appears well-developed and  well-nourished. No distress.    Cardiovascular: Normal rate, regular rhythm, normal heart sounds and intact distal pulses.   Pulses:      Radial pulses are 2+ on the right side, and 2+ on the left side.  Unable to definitively palpate bilateral DP's due to LE swelling/ see ROS/ skin  Respiratory: Effort normal and breath sounds normal. No respiratory distress. She has no wheezes. She has no rales.  GI: Soft. Bowel sounds are normal.  Musculoskeletal: She exhibits edema.  See ROS/ skin  Neurological: She is alert and oriented to person, place, and time.  Skin: Skin is warm and dry. There is erythema.  See ROS/ skin  Psychiatric: She has a normal mood and affect. Her behavior is normal. Judgment and thought content normal.    Encounter Medications:   Outpatient Encounter Prescriptions as of 05/06/2016  Medication Sig Note  . albuterol (PROVENTIL HFA;VENTOLIN HFA) 108 (90 BASE) MCG/ACT inhaler Inhale 2 puffs into the lungs every 6 (six) hours as needed for wheezing or shortness of breath. Reported on 10/30/2015 02/26/2016: Has not needed to use recently  . aspirin EC 81 MG tablet Take 81 mg by mouth daily.   . carvedilol (COREG) 25 MG tablet Take 1 tablet (25 mg  total) by mouth 2 (two) times daily with a meal.   . clonazePAM (KLONOPIN) 1 MG tablet TAKE 1 TABLET BY MOUTH 3 TIMES A DAY   . clopidogrel (PLAVIX) 75 MG tablet TAKE 1 TABLET (75 MG TOTAL) BY MOUTH DAILY WITH BREAKFAST.   . ferrous sulfate 325 (65 FE) MG tablet Take 325 mg by mouth daily with breakfast. Reported on 08/21/2015 09/27/2015: Taking non prescription form   . insulin NPH-regular Human (NOVOLIN 70/30) (70-30) 100 UNIT/ML injection Inject 50-60 Units into the skin 2 (two) times daily with a meal. Inject 50 units in the morning and 60 units in the afternoon   . isosorbide dinitrate (ISORDIL) 10 MG tablet TAKE 1 TABLET (10 MG TOTAL) BY MOUTH 2 (TWO) TIMES DAILY.   Marland Kitchen levothyroxine (SYNTHROID, LEVOTHROID) 50 MCG tablet Take 50 mcg by  mouth daily before breakfast.   . losartan (COZAAR) 50 MG tablet TAKE 1 TABLET BY MOUTH DAILY   . metolazone (ZAROXOLYN) 2.5 MG tablet Take 1 tablet (2.5 mg total) by mouth 2 (two) times a week. Tuesday and Wednesday.   Weight gain/shortness of breath/swelling   . nitroGLYCERIN (NITROSTAT) 0.4 MG SL tablet Place 1 tablet (0.4 mg total) under the tongue every 5 (five) minutes as needed for chest pain.   Glory Rosebush VERIO test strip    . PARoxetine (PAXIL-CR) 25 MG 24 hr tablet Take 25 mg by mouth daily. Take with 25 mg tablet daily (total 62.5 mg)   . PARoxetine (PAXIL-CR) 37.5 MG 24 hr tablet Take 37.5 mg by mouth daily. Take with 25 mg tablet daily (total 62.5 mg)   . pregabalin (LYRICA) 150 MG capsule Take 150 mg by mouth 3 (three) times daily. Reported on 10/31/2015   . torsemide (DEMADEX) 20 MG tablet Take 4 tablets (80 mg total) by mouth 2 (two) times daily.    No facility-administered encounter medications on file as of 05/06/2016.     Functional Status:   In your present state of health, do you have any difficulty performing the following activities: 02/26/2016 10/30/2015  Hearing? N N  Vision? N N  Difficulty concentrating or making decisions? Y N  Walking or climbing stairs? Y Y  Dressing or bathing? N Y  Doing errands, shopping? N N  Preparing Food and eating ? N N  Using the Toilet? N N  In the past six months, have you accidently leaked urine? Y Y  Do you have problems with loss of bowel control? N N  Managing your Medications? N N  Managing your Finances? N N  Housekeeping or managing your Housekeeping? N N  Some recent data might be hidden    Fall/Depression Screening:    PHQ 2/9 Scores 12/18/2015 10/19/2015 08/09/2015 08/07/2015 03/16/2015  PHQ - 2 Score 4 4 4 4  0  PHQ- 9 Score 13 16 16 16  0    Assessment:  Susan [name] continues to need reinforcement of self-health management of chronic disease states of CHF and DM, as well as compliance with instructions provided to her regarding  reasons to seek health care urgently/ emergently, and the complications that can result from non- compliance. Susan [name] does not have a strong support system within her family, and benefits from extra support and reinforcement from her healthcare providers.  Susan [name] is committed to compliance with her medication regimen and to attending scheduled provider appointments.  Plan:   Susan [name] will continue to take all of her medications as they are prescribed and will keep all scheduled provider appointments.  Susan [name] will call EMS for any urgent or emergent issues or concerns she experiences.Susan [name] will contact her care/ medical providers to report concerns with her blood sugars running high or low, signs and symptoms of fluid overload in the setting of CHF, or any falls she experiences.   Susan [name] will continue to monitor and record her daily weight and blood sugars, as she has been, and will take these values to ALLprovider appointments with her.  Susan [name] will contact me directly if she has not heard from [place] by afternoon of Thursday May 08, 2016  I will route note from today's in-home visit to Dr. [name] endocrinology and Darrick Grinder, NP [facility] provider  [place] outreach to continue with scheduled [place] telephone outreach in 2 weeks and routine home visit next month.  [name], RN, BSN, [employer] [facility] Care Management  [phone]

## 2016-05-09 DIAGNOSIS — E785 Hyperlipidemia, unspecified: Secondary | ICD-10-CM | POA: Diagnosis not present

## 2016-05-09 DIAGNOSIS — L03115 Cellulitis of right lower limb: Secondary | ICD-10-CM | POA: Diagnosis not present

## 2016-05-09 DIAGNOSIS — Z87891 Personal history of nicotine dependence: Secondary | ICD-10-CM | POA: Diagnosis not present

## 2016-05-09 DIAGNOSIS — I5033 Acute on chronic diastolic (congestive) heart failure: Secondary | ICD-10-CM | POA: Diagnosis not present

## 2016-05-09 DIAGNOSIS — Z794 Long term (current) use of insulin: Secondary | ICD-10-CM | POA: Diagnosis not present

## 2016-05-09 DIAGNOSIS — I2510 Atherosclerotic heart disease of native coronary artery without angina pectoris: Secondary | ICD-10-CM | POA: Diagnosis not present

## 2016-05-09 DIAGNOSIS — N183 Chronic kidney disease, stage 3 (moderate): Secondary | ICD-10-CM | POA: Diagnosis not present

## 2016-05-09 DIAGNOSIS — L03116 Cellulitis of left lower limb: Secondary | ICD-10-CM | POA: Diagnosis not present

## 2016-05-09 DIAGNOSIS — Z7982 Long term (current) use of aspirin: Secondary | ICD-10-CM | POA: Diagnosis not present

## 2016-05-09 DIAGNOSIS — F419 Anxiety disorder, unspecified: Secondary | ICD-10-CM | POA: Diagnosis not present

## 2016-05-09 DIAGNOSIS — I872 Venous insufficiency (chronic) (peripheral): Secondary | ICD-10-CM | POA: Diagnosis not present

## 2016-05-09 DIAGNOSIS — E1122 Type 2 diabetes mellitus with diabetic chronic kidney disease: Secondary | ICD-10-CM | POA: Diagnosis not present

## 2016-05-09 DIAGNOSIS — I130 Hypertensive heart and chronic kidney disease with heart failure and stage 1 through stage 4 chronic kidney disease, or unspecified chronic kidney disease: Secondary | ICD-10-CM | POA: Diagnosis not present

## 2016-05-12 ENCOUNTER — Encounter (HOSPITAL_COMMUNITY): Payer: Self-pay

## 2016-05-12 ENCOUNTER — Ambulatory Visit (HOSPITAL_COMMUNITY)
Admission: RE | Admit: 2016-05-12 | Discharge: 2016-05-12 | Disposition: A | Discharge status: Home / Self Care | Payer: PPO | Source: Ambulatory Visit | Attending: Cardiology | Admitting: Cardiology

## 2016-05-12 VITALS — BP 128/82 | HR 74 | Wt 283.0 lb

## 2016-05-12 DIAGNOSIS — F419 Anxiety disorder, unspecified: Secondary | ICD-10-CM | POA: Diagnosis not present

## 2016-05-12 DIAGNOSIS — N183 Chronic kidney disease, stage 3 unspecified: Secondary | ICD-10-CM

## 2016-05-12 DIAGNOSIS — R0683 Snoring: Secondary | ICD-10-CM | POA: Diagnosis not present

## 2016-05-12 DIAGNOSIS — L0390 Cellulitis, unspecified: Secondary | ICD-10-CM | POA: Diagnosis not present

## 2016-05-12 DIAGNOSIS — I2510 Atherosclerotic heart disease of native coronary artery without angina pectoris: Secondary | ICD-10-CM | POA: Diagnosis not present

## 2016-05-12 DIAGNOSIS — Z7982 Long term (current) use of aspirin: Secondary | ICD-10-CM | POA: Diagnosis not present

## 2016-05-12 DIAGNOSIS — I878 Other specified disorders of veins: Secondary | ICD-10-CM | POA: Diagnosis not present

## 2016-05-12 DIAGNOSIS — I5032 Chronic diastolic (congestive) heart failure: Secondary | ICD-10-CM | POA: Insufficient documentation

## 2016-05-12 DIAGNOSIS — E785 Hyperlipidemia, unspecified: Secondary | ICD-10-CM | POA: Diagnosis not present

## 2016-05-12 DIAGNOSIS — E669 Obesity, unspecified: Secondary | ICD-10-CM | POA: Insufficient documentation

## 2016-05-12 DIAGNOSIS — I429 Cardiomyopathy, unspecified: Secondary | ICD-10-CM | POA: Insufficient documentation

## 2016-05-12 DIAGNOSIS — E1122 Type 2 diabetes mellitus with diabetic chronic kidney disease: Secondary | ICD-10-CM | POA: Insufficient documentation

## 2016-05-12 DIAGNOSIS — I5033 Acute on chronic diastolic (congestive) heart failure: Secondary | ICD-10-CM | POA: Diagnosis not present

## 2016-05-12 DIAGNOSIS — Z5189 Encounter for other specified aftercare: Secondary | ICD-10-CM | POA: Diagnosis not present

## 2016-05-12 DIAGNOSIS — I130 Hypertensive heart and chronic kidney disease with heart failure and stage 1 through stage 4 chronic kidney disease, or unspecified chronic kidney disease: Secondary | ICD-10-CM | POA: Insufficient documentation

## 2016-05-12 DIAGNOSIS — Z87891 Personal history of nicotine dependence: Secondary | ICD-10-CM | POA: Diagnosis not present

## 2016-05-12 DIAGNOSIS — I872 Venous insufficiency (chronic) (peripheral): Secondary | ICD-10-CM | POA: Diagnosis not present

## 2016-05-12 LAB — BASIC METABOLIC PANEL
Anion gap: 16 — ABNORMAL HIGH (ref 5–15)
BUN: 111 mg/dL — ABNORMAL HIGH (ref 6–20)
CO2: 26 mmol/L (ref 22–32)
Calcium: 8.5 mg/dL — ABNORMAL LOW (ref 8.9–10.3)
Chloride: 85 mmol/L — ABNORMAL LOW (ref 101–111)
Creatinine, Ser: 2.5 mg/dL — ABNORMAL HIGH (ref 0.44–1.00)
GFR calc Af Amer: 22 mL/min — ABNORMAL LOW (ref >60)
GFR calc non Af Amer: 19 mL/min — ABNORMAL LOW (ref >60)
Glucose, Bld: 515 mg/dL (ref 65–99)
Potassium: 3.7 mmol/L (ref 3.5–5.1)
Sodium: 127 mmol/L — ABNORMAL LOW (ref 135–145)

## 2016-05-12 NOTE — Patient Instructions (Signed)
Labs today (will call for abnormal results, otherwise no news is good news)  Your provider has scheduled you for ABI's  Follow up in 4 weeks

## 2016-05-12 NOTE — Progress Notes (Signed)
Patient ID: Susan [name], female   DOB: Aug 16, 1950, 65 y.o.   MRN: [id number]   PCP: Dr Edilia Bo  Cardiologist: Dr Myrtis Ser  Endocrinologist: Dr Lurene Shadow  Primary HF Cardiologist: Dr. Gala Romney  [facility] Patient HPI: Susan [name] is a 65 year old with a history of RBBB, DM, Hypothyroid, diastolic heart failure, CAD MI 2000/2007 BMS 2007, TIA, obesity.   Admitted 06/07/15 with N,V, hyperglycemia. Creatinine was up to 1.9. On the day discharge creatinine was back down to 1.2 Thought to be dehydrated so diuretics were held. Diuretics restarted on the day discharge. Discharge weight 282 pounds.   Admitted 2/13 through 07/13/15 with gastroenteritis and hyper glycemia. Diuretic initially held due to dehydration. Losartan and metolazone stopped with AKI/CKD. Discharge weight was 283 pounds.   Admitted June 1st through June 3rd with altered mental status, hypothermia, and weakness. Diuretics initially held and later restarted. Unfortunately when she was discharged she refused home health.  She returns today for HF follow up.  Last visit torsemide was increased to 80 mg twice a day and metolazone was added 2.5 mg on Tuesday . She was also referred to [facility] for RLE and LLE wraps. Weight at home has gone down form 298 to 278 pounds. Overall feeling better. Mild dyspnea with exertion. Denies PND/Orthopnea. Taking all medications. [name] follows monthly. [facility] following for leg wraps.   ECHO 07/11/2015- 50-55%.  Grade I DD  RHC 09/22/13: RA = 4  RV = 30/5/7  PA = 25/10 (16)  PCW = 7  Fick cardiac output/index = 6.3/2.7  PVR = 1.5 WU   FA sat = 93%  PA sat = 61%, 66%   Labs: 07/18/13 K 4.8 Creatinine 1.38 Glucose 1005 she refused to go to [facility] Labs : 08/17/13 K 3.9 Creatinine 1.4 Glucose 499 Labs: 09/29/13 K 4.5 Creatinine 1.4  Labs 04/18/14 K 4.4 Creatinine 1.18 Labs 03/26/2015: K 5.3 Creatinine 1.41  Labs 05/01/2015: K 4.9 Creatinine 1.8 Labs 05/04/2015: K 5.0 Creatinine 1.74  Labs 06/10/2015: K 4.7  Creatinine 1.21 Labs 06/12/2015: K 6.3 Creatinine 2.01  Labs 06/19/2015: K 4.5 Creatinine 1.29  Labs 07/10/2015: Hgb A1C 9.5  Labs 07/12/2015: K 4.4 Creatinine 1.62 K 4.4  Labs 08/10/2015: K 5.3 Creatinine 1.44  Labs 10/03/2015: K 4.5 Creatinine 1.41  Labs 10/19/15: K 4.7 creatinine 1.75  Labs 10/28/2015: K 4.5 Creatinine 1.2  Labs 03/10/2016: K 3.7 Creatinine 2.04 Glucose 630  Labs 03/31/2016: K 3.3 Creatinine 2.13 Glucose 96   SH: Former smoker quit 15 year ago. Does drink alcohol. She is disabled. Lives at home with her husband and disabled son - Husband is truck Hospital doctor. Son has a brain injury after he attempted suicide  A few years ago. .    FH: Mom MI  ROS: All systems negative except as listed in HPI, PMH and Problem List.  Past Medical History:  Diagnosis Date  . Acute MI 1999; 2007  . Anemia    hx  . Anginal pain (HCC)   . Anxiety   . Arthritis    "generalized" (03/15/2014)  . CAD (coronary artery disease)    MI in 2000 - MI  2007 - treated bare metal stent (no nuclear since then as 9/11)  . Carotid artery disease (HCC)   . CHF (congestive heart failure) (HCC)   . Chronic diastolic heart failure (HCC)    a) ECHO (08/2013) EF 55-60% and RV function nl b) RHC (08/2013) RA 4, RV 30/5/7, PA 25/10 (16), PCWP 7, Fick CO/CI 6.3/2.7,  PVR 1.5 WU, PA 61 and 66%  . Daily headache    "~ every other day; since I fell in June" (03/15/2014)  . Depression   . Dyslipidemia   . Exertional shortness of breath   . HTN (hypertension)   . Hypothyroidism   . Obesity   . Osteoarthritis   . Peripheral neuropathy (HCC)   . PONV (postoperative nausea and vomiting)   . RBBB (right bundle branch block)    Old  . Renal insufficiency    DENIES  . Stroke [facility])    mini strokes  . Syncope    likely due to low blood sugar  . Tachycardia    Sinus tachycardia  . Type II diabetes mellitus (HCC)   . Urinary incontinence   . Venous insufficiency     Current Outpatient Prescriptions  Medication Sig  Dispense Refill  . aspirin EC 81 MG tablet Take 81 mg by mouth daily.    . carvedilol (COREG) 25 MG tablet Take 1 tablet (25 mg total) by mouth 2 (two) times daily with a meal. 60 tablet 6  . clonazePAM (KLONOPIN) 1 MG tablet TAKE 1 TABLET BY MOUTH 3 TIMES A DAY 90 tablet 2  . clopidogrel (PLAVIX) 75 MG tablet TAKE 1 TABLET (75 MG TOTAL) BY MOUTH DAILY WITH BREAKFAST. 90 tablet 2  . ferrous sulfate 325 (65 FE) MG tablet Take 325 mg by mouth daily with breakfast. Reported on 08/21/2015    . insulin NPH-regular Human (NOVOLIN 70/30) (70-30) 100 UNIT/ML injection Inject 50-60 Units into the skin 2 (two) times daily with a meal. Inject 50 units in the morning and 60 units in the afternoon    . isosorbide dinitrate (ISORDIL) 10 MG tablet TAKE 1 TABLET (10 MG TOTAL) BY MOUTH 2 (TWO) TIMES DAILY. 60 tablet 10  . levothyroxine (SYNTHROID, LEVOTHROID) 50 MCG tablet Take 50 mcg by mouth daily before breakfast.    . losartan (COZAAR) 50 MG tablet TAKE 1 TABLET BY MOUTH DAILY 90 tablet 0  . metolazone (ZAROXOLYN) 2.5 MG tablet Take 2.5 mg by mouth once a week. Take 1 tablet every Wednesday    . ONETOUCH VERIO test strip     . PARoxetine (PAXIL-CR) 25 MG 24 hr tablet Take 25 mg by mouth daily. Take with 25 mg tablet daily (total 62.5 mg)    . PARoxetine (PAXIL-CR) 37.5 MG 24 hr tablet Take 37.5 mg by mouth daily. Take with 25 mg tablet daily (total 62.5 mg)    . pregabalin (LYRICA) 150 MG capsule Take 150 mg by mouth 3 (three) times daily. Reported on 10/31/2015    . torsemide (DEMADEX) 20 MG tablet Take 4 tablets (80 mg total) by mouth 2 (two) times daily. 240 tablet 3  . albuterol (PROVENTIL HFA;VENTOLIN HFA) 108 (90 BASE) MCG/ACT inhaler Inhale 2 puffs into the lungs every 6 (six) hours as needed for wheezing or shortness of breath. Reported on 10/30/2015    . nitroGLYCERIN (NITROSTAT) 0.4 MG SL tablet Place 1 tablet (0.4 mg total) under the tongue every 5 (five) minutes as needed for chest pain. (Patient not  taking: Reported on 05/12/2016) 25 tablet 3   No current facility-administered medications for this encounter.     Vitals:   05/12/16 1355  BP: 128/82  Pulse: 74  SpO2: 96%  Weight: 283 lb (128.4 kg)   Wt Readings from Last 3 Encounters:  05/12/16 283 lb (128.4 kg)  05/06/16 281 lb (127.5 kg)  04/28/16 (!) 301 lb 9.6  oz (136.8 kg)    Chest circumference 134 cm   PHYSICAL EXAM: General:  Chronically ill appearing. Ambulated into the clinic without difficulty     HEENT: normal Neck: supple. JVP difficult to assess due to body habitus.      Carotids 2+ bilaterally; no bruits. No thyromegaly or nodule noted. Cor: PMI normal. RRR. No M/G/R Lungs: Clear.  Abdomen: obese, soft, NT, ND, no HSM. No bruits or masses. +BS  Extremities: no cyanosis, clubbing, rash, RLE and LLE compression wraps.   Neuro: alert & orientedx3, cranial nerves grossly intact. Moves all 4 extremities w/o difficulty. Affect pleasant.   ASSESSMENT & PLAN: 1. Chronic Diastolic Heart Failure: NICM, EF 55-60% Grade II DD(06/2015 )  -NYHA III symptoms. Volume status stable.  Continue torsemide 80 mg twice a day + metolazone once a week.   -Off potassium now with recent high K.   - Continue losartan 50 mg daily. - Continue coreg  25 mg twice a day.   - Reinforced importance of daily weights, low sodium diet, and fluid restriction. Instructed to call the HF clinic if weight increases more than 3 lbs overnight or 5 lbs in a week -Discussed cardiomems. Will start the approval process for cardiomems.  2.. Dyspnea - Stable today. Use inhalers as needed. Has refused sleep studies/CPAP. 3. Uncontrolled DM:   - Seeing Dr. Lurene Shadow. Has been more stable on recent checks.  4. CAD - No CP or ACS symptoms.  - Continue ASA 81 mg daily. 5. Obesity: - Again reviewed portion control and importance of weight loss.   6. HTN - Improved today   7. CKD Stage III 8. Day time fatigue - Continues to refuse sleep study.  9. Chronic  Venous Stasis 10. LLE - Cellulitis and lower extremity wounds. Add compression wraps. Start doxycycline 100 mg twice a day.   BMET today.  Refer for ABI. Follow up in 4 weeks.    Quest Tavenner NP-C   1:52 PM

## 2016-05-13 ENCOUNTER — Other Ambulatory Visit: Payer: Self-pay | Admitting: Licensed Clinical Social Worker

## 2016-05-13 DIAGNOSIS — I5033 Acute on chronic diastolic (congestive) heart failure: Secondary | ICD-10-CM | POA: Diagnosis not present

## 2016-05-13 DIAGNOSIS — N183 Chronic kidney disease, stage 3 (moderate): Secondary | ICD-10-CM | POA: Diagnosis not present

## 2016-05-13 DIAGNOSIS — L03116 Cellulitis of left lower limb: Secondary | ICD-10-CM | POA: Diagnosis not present

## 2016-05-13 DIAGNOSIS — I2510 Atherosclerotic heart disease of native coronary artery without angina pectoris: Secondary | ICD-10-CM | POA: Diagnosis not present

## 2016-05-13 DIAGNOSIS — I872 Venous insufficiency (chronic) (peripheral): Secondary | ICD-10-CM | POA: Diagnosis not present

## 2016-05-13 DIAGNOSIS — I130 Hypertensive heart and chronic kidney disease with heart failure and stage 1 through stage 4 chronic kidney disease, or unspecified chronic kidney disease: Secondary | ICD-10-CM | POA: Diagnosis not present

## 2016-05-13 DIAGNOSIS — E785 Hyperlipidemia, unspecified: Secondary | ICD-10-CM | POA: Diagnosis not present

## 2016-05-13 DIAGNOSIS — E1122 Type 2 diabetes mellitus with diabetic chronic kidney disease: Secondary | ICD-10-CM | POA: Diagnosis not present

## 2016-05-13 DIAGNOSIS — F419 Anxiety disorder, unspecified: Secondary | ICD-10-CM | POA: Diagnosis not present

## 2016-05-13 DIAGNOSIS — Z794 Long term (current) use of insulin: Secondary | ICD-10-CM | POA: Diagnosis not present

## 2016-05-13 DIAGNOSIS — Z87891 Personal history of nicotine dependence: Secondary | ICD-10-CM | POA: Diagnosis not present

## 2016-05-13 DIAGNOSIS — Z7982 Long term (current) use of aspirin: Secondary | ICD-10-CM | POA: Diagnosis not present

## 2016-05-13 DIAGNOSIS — L03115 Cellulitis of right lower limb: Secondary | ICD-10-CM | POA: Diagnosis not present

## 2016-05-13 NOTE — Addendum Note (Signed)
Encounter addended by: Kennieth Rad, RN on: 05/13/2016  2:09 PM<BR>    Actions taken: Order list changed, Diagnosis association updated

## 2016-05-13 NOTE — Patient Outreach (Signed)
Assessment:  CSW spoke via phone with client. CSW verified client identity. CSW and client spoke of client needs. Client sees [name], Systems analyst, as primary care provider. Client has prescribed medications and is taking medications as prescribed. Client often drives herself to scheduled medical appointments. [facility] [place], [place]. Client receives [place] support with RN Reginia Naas. Client has a walker and a cane to use to assist her in ambulation. Client sees Dr. Soyla Murphy as endocrinologist. Client has [employer] insurance.  CSW and client spoke of client care plan. CSW encouraged  client to attend all scheduled client medical appointments in next 30 days. Client has talked with Reginia Naas ,RN, about client's calling 911 as needed for emergency assistance for client.  Client said she [facility] [place], [place] on 05/12/16.  Client said that [place] is now helping her in wrapping legs of client  as scheduled.  She said she has occasional weeping of skin on her lower extremities (both legs).  She uses walker to help her with ambulation.  She said she takes insulin as prescribed. She said her sugar levels have still been high on occasion.  She spoke of having headaches and having some difficulty in sleeping.  She said she had not had any recent chest pains. She has adequate food supply. Client has appointment with Dr. Soyla Murphy on 05/27/16.  Client said she was told by [place] representative that home health visit for wrapping of client legs was only approved for 3 such visits by client's insurance.  Client said that by the end of this week, [place] representative will have made 2 such home visits to wrap legs of client. Client said she has difficulty getting her shoes on after legs are wrapped. She said she is driving herself to scheduled  appointments. She said her weight still fluctuates.  CSW and client spoke of [place] client receives from [employer].  CSW encouraged client to call CSW at [phone] as needed to discuss social work needs of client. Client was appreciative of call from [place] on 05/13/16.   Plan:  Client to attend all scheduled client medical appointments in next 30 days.  CSW to collaborate with RN Reginia Naas, as needed, to monitor client needs.  CSW to call client in 4 weeks to assess client needs at that time.  [name].Devonn Giampietro MSW, LCSW Licensed Clinical Social Worker [facility] Care Management [phone]

## 2016-05-15 DIAGNOSIS — I872 Venous insufficiency (chronic) (peripheral): Secondary | ICD-10-CM | POA: Diagnosis not present

## 2016-05-15 DIAGNOSIS — F419 Anxiety disorder, unspecified: Secondary | ICD-10-CM | POA: Diagnosis not present

## 2016-05-15 DIAGNOSIS — I5033 Acute on chronic diastolic (congestive) heart failure: Secondary | ICD-10-CM | POA: Diagnosis not present

## 2016-05-15 DIAGNOSIS — Z7982 Long term (current) use of aspirin: Secondary | ICD-10-CM | POA: Diagnosis not present

## 2016-05-15 DIAGNOSIS — E785 Hyperlipidemia, unspecified: Secondary | ICD-10-CM | POA: Diagnosis not present

## 2016-05-15 DIAGNOSIS — I130 Hypertensive heart and chronic kidney disease with heart failure and stage 1 through stage 4 chronic kidney disease, or unspecified chronic kidney disease: Secondary | ICD-10-CM | POA: Diagnosis not present

## 2016-05-15 DIAGNOSIS — Z87891 Personal history of nicotine dependence: Secondary | ICD-10-CM | POA: Diagnosis not present

## 2016-05-20 ENCOUNTER — Other Ambulatory Visit: Payer: Self-pay | Admitting: *Deleted

## 2016-05-20 ENCOUNTER — Encounter: Payer: Self-pay | Admitting: *Deleted

## 2016-05-20 DIAGNOSIS — Z7982 Long term (current) use of aspirin: Secondary | ICD-10-CM | POA: Diagnosis not present

## 2016-05-20 DIAGNOSIS — I5033 Acute on chronic diastolic (congestive) heart failure: Secondary | ICD-10-CM | POA: Diagnosis not present

## 2016-05-20 DIAGNOSIS — F419 Anxiety disorder, unspecified: Secondary | ICD-10-CM | POA: Diagnosis not present

## 2016-05-20 DIAGNOSIS — L03116 Cellulitis of left lower limb: Secondary | ICD-10-CM | POA: Diagnosis not present

## 2016-05-20 DIAGNOSIS — I130 Hypertensive heart and chronic kidney disease with heart failure and stage 1 through stage 4 chronic kidney disease, or unspecified chronic kidney disease: Secondary | ICD-10-CM | POA: Diagnosis not present

## 2016-05-20 DIAGNOSIS — N183 Chronic kidney disease, stage 3 (moderate): Secondary | ICD-10-CM | POA: Diagnosis not present

## 2016-05-20 DIAGNOSIS — I872 Venous insufficiency (chronic) (peripheral): Secondary | ICD-10-CM | POA: Diagnosis not present

## 2016-05-20 DIAGNOSIS — L03115 Cellulitis of right lower limb: Secondary | ICD-10-CM | POA: Diagnosis not present

## 2016-05-20 DIAGNOSIS — E785 Hyperlipidemia, unspecified: Secondary | ICD-10-CM | POA: Diagnosis not present

## 2016-05-20 DIAGNOSIS — E1122 Type 2 diabetes mellitus with diabetic chronic kidney disease: Secondary | ICD-10-CM | POA: Diagnosis not present

## 2016-05-20 DIAGNOSIS — I2510 Atherosclerotic heart disease of native coronary artery without angina pectoris: Secondary | ICD-10-CM | POA: Diagnosis not present

## 2016-05-20 DIAGNOSIS — Z87891 Personal history of nicotine dependence: Secondary | ICD-10-CM | POA: Diagnosis not present

## 2016-05-20 DIAGNOSIS — Z794 Long term (current) use of insulin: Secondary | ICD-10-CM | POA: Diagnosis not present

## 2016-05-20 NOTE — Patient Outreach (Signed)
[facility]) Care Management [place] Telephone Outreach 05/20/2016  Susan [name] 10-30-1950 [id number]  Successful telephone outreach to Susan [name], on [facility] CM written consent, husband of Susan [name] 65 y.o. female well known to [place] for for self-management of chronic disease states of CHF and DM. Susan [name] has had 3 inpatient hospital visits since January 2017, but no hospital readmissions for the last 6-plusmonths. HIPAA/ identity verified with patient's husband Susan [name] during phone call today.  Today, patient's husband reports that Susan [name] "is doing okay and just had a visit from the home health nurse."  Husband reports that patient's blood sugars have been "running high," but states that he is unsure of the exact blood sugar readings that patient has been recording at home.  Husband reports that patient is currently taking a nap and is asleep.  Husband asks that I call patient later this week to obtain additional information directly from patient.  Husband denies further questions, concerns, issues or problems today.  Plan:   Susan [name] will continue to take all of her medications as they are prescribed and will keep all scheduled provider appointments.  Susan [name] will call EMS for any urgent or emergent issues or concerns she experiences.Susan [name] will contact her care/ medical providers to report concerns with her blood sugars running high or low, signs and symptoms of fluid overload in the setting of CHF, or any falls she experiences.   Susan [name] will continue to monitor and record her daily weight and blood sugars, as she has been, and will take these values to ALLprovider appointments with her.  [place] outreach to continue with scheduled [place] telephone outreach later this week and routine home visit next month.  [name], RN, BSN, [employer] [facility] Care Management  [phone]

## 2016-05-21 ENCOUNTER — Other Ambulatory Visit: Payer: Self-pay | Admitting: *Deleted

## 2016-05-21 ENCOUNTER — Encounter: Payer: Self-pay | Admitting: *Deleted

## 2016-05-21 DIAGNOSIS — Z87891 Personal history of nicotine dependence: Secondary | ICD-10-CM | POA: Diagnosis not present

## 2016-05-21 DIAGNOSIS — I5033 Acute on chronic diastolic (congestive) heart failure: Secondary | ICD-10-CM | POA: Diagnosis not present

## 2016-05-21 DIAGNOSIS — Z7982 Long term (current) use of aspirin: Secondary | ICD-10-CM | POA: Diagnosis not present

## 2016-05-21 DIAGNOSIS — I872 Venous insufficiency (chronic) (peripheral): Secondary | ICD-10-CM | POA: Diagnosis not present

## 2016-05-21 DIAGNOSIS — E785 Hyperlipidemia, unspecified: Secondary | ICD-10-CM | POA: Diagnosis not present

## 2016-05-21 DIAGNOSIS — F419 Anxiety disorder, unspecified: Secondary | ICD-10-CM | POA: Diagnosis not present

## 2016-05-21 DIAGNOSIS — I130 Hypertensive heart and chronic kidney disease with heart failure and stage 1 through stage 4 chronic kidney disease, or unspecified chronic kidney disease: Secondary | ICD-10-CM | POA: Diagnosis not present

## 2016-05-21 NOTE — Patient Outreach (Signed)
[place] [facility]) Care Management [place] Telephone Outreach 05/21/2016  Susan [name] May 09, 1951 [id number]  Successful telephone outreach to Susan [name], 65 y.o. female well known to [place] for for self-management of chronic disease states of CHF and DM. Susan [name] has had 3 inpatient hospital visits since January 2017, but no hospital readmissions for the last 6-plusmonths. This morning, [name] returned my call from yesterday and left me a voice mail message, asking me to call her.  HIPAA/ identity verified with patient during phone call today.  Today, Susan [name] reports that she is "not doing good," and reports that her blood sugars have been reading "HIGH" on her glucose monitor "for about a week," and that over the last 2 days, she has been feeling dizzy and thinks she "might have a cold."  Susan [name] also reports that although she "feels hungry," she has been unable to "keep anything down" when she eats, and reports that she has had episodes of nausea/ vomiting.  -- DM: Susan [name] reports that she has continued new insulin guidelines as directed by Dr. [name] on April 14, 2016. Started new insulin dosing on April 28, 2016.  During [place] in-home visit on May 06, 2016, patient's blood sugar ranges had been under better control with the following ranges noted:  As of May 06, 2016: General range of recorded blood sugars since April 28, 2016:  326-712 Morning fasting range since April 28, 2016:  458-099 Evening post-prandial range since April 28, 2016:  833-825  Susan [name] again verbalized a good understanding of the new insulin dosing today, and reports no episodes of hypoglycemia. Patient stated that she has been checking her blood sugars 3-4 times daily, and that they have been registering "HIGH" for about one week; patient states that she obtained a reading of "516" at lunch today.  Patient reports new onset of N/V and dizziness x 2 days.   Patient was advised to contact her endocrinology and primary care providers about her new onset of symptoms as reported above, and patient agreed to do so.  Patient reports upcoming scheduled appointment with Dr. [name] (endocrinology) on May 27, 2016.  -- CHF: Susan [name] reports that she has continued monitoring and recording daily weights; reports weight today of 274.0lbs (patient's scales at home), which is down from May 06, 2016 weight of 281.0 pounds.  Reviewed notes with patient from 05/12/16 visit to Advanced [facility] Failure clinic appointment, and verified that patient had completed antibiotics that were started then.  Patient also confirms that she has been taking Toresemide is now 80 mg BID, but has not been taking Metolazone 2.5 mg once per week, as instructed during 05/12/16 [facility] provider appointment, stating that she forgot. Reviewed 05/12/16 post-visit instructions with patient, and patient verbalized accurate diuretic dosing plan by end of our phone conversation.  Today, Susan [name] reports accurate dosing of carvedilol 25 mg BID (total daily dose of 50 mg), (on 05/06/16 [name] CM in-home visit, it was discovered that patient had not been taking as ordered).   Susan [name] confirmed today that [facility]) through [facility]) for leg wrapping/ diuretic protocol is finally in progress, and reports nurse visited her yesterday and told her that her legs were "looking better.  Reports that [place] [facility] requested extension of [facility] service, and has 2 more visits over the next 2 weeks.  -- Provider appointments:  All upcoming provider appointments were reviewed with patient over phone today, and patient confirms that she is aware of  all provider appointments and plans to attend all as scheduled, as long as she "isn't too dizzy to drive."  Reasons to contact EMS/ call 911/ go to hospital were reviewed with patient thoroughly today.  Patient agreed not to drive if she is feeling dizzy, and  verbalized that she would go to hospital if necessary.  Susan [name] denies further questions, concerns, issues or problems today, and I again encouraged her to contact her providers regarding the symptoms that she has reported to me today, which she agreed to do. We confirmed [place] CM in-home visit scheduled for next month.  Plan:   Susan [name] will continue to take all of her medications as they are prescribed and will attend all scheduled provider appointments.  Susan [name] will call EMS for any urgent or emergent issues or concerns she experiences.Susan [name] will contact her care/ medical providers to report concerns with her new onset of dizziness and N/V, with her blood sugars running high or low, signs and symptoms of fluid overload in the setting of CHF, or any falls she experiences.   Susan [name] will continue to monitor and record her daily weight and blood sugars, as she has been, and will take these values to ALLprovider appointments with her.  [place] outreach to continue with scheduled [place] routine home visit next month.  [name], RN, BSN, [employer] [facility] Care Management  [phone]

## 2016-05-22 ENCOUNTER — Ambulatory Visit: Payer: Self-pay | Admitting: *Deleted

## 2016-05-27 DIAGNOSIS — I130 Hypertensive heart and chronic kidney disease with heart failure and stage 1 through stage 4 chronic kidney disease, or unspecified chronic kidney disease: Secondary | ICD-10-CM | POA: Diagnosis not present

## 2016-05-27 DIAGNOSIS — I10 Essential (primary) hypertension: Secondary | ICD-10-CM | POA: Diagnosis not present

## 2016-05-27 DIAGNOSIS — F419 Anxiety disorder, unspecified: Secondary | ICD-10-CM | POA: Diagnosis not present

## 2016-05-27 DIAGNOSIS — G609 Hereditary and idiopathic neuropathy, unspecified: Secondary | ICD-10-CM | POA: Diagnosis not present

## 2016-05-27 DIAGNOSIS — Z794 Long term (current) use of insulin: Secondary | ICD-10-CM | POA: Diagnosis not present

## 2016-05-27 DIAGNOSIS — L03116 Cellulitis of left lower limb: Secondary | ICD-10-CM | POA: Diagnosis not present

## 2016-05-27 DIAGNOSIS — Z7982 Long term (current) use of aspirin: Secondary | ICD-10-CM | POA: Diagnosis not present

## 2016-05-27 DIAGNOSIS — I872 Venous insufficiency (chronic) (peripheral): Secondary | ICD-10-CM | POA: Diagnosis not present

## 2016-05-27 DIAGNOSIS — N183 Chronic kidney disease, stage 3 (moderate): Secondary | ICD-10-CM | POA: Diagnosis not present

## 2016-05-27 DIAGNOSIS — I5033 Acute on chronic diastolic (congestive) heart failure: Secondary | ICD-10-CM | POA: Diagnosis not present

## 2016-05-27 DIAGNOSIS — E1122 Type 2 diabetes mellitus with diabetic chronic kidney disease: Secondary | ICD-10-CM | POA: Diagnosis not present

## 2016-05-27 DIAGNOSIS — R809 Proteinuria, unspecified: Secondary | ICD-10-CM | POA: Diagnosis not present

## 2016-05-27 DIAGNOSIS — E1165 Type 2 diabetes mellitus with hyperglycemia: Secondary | ICD-10-CM | POA: Diagnosis not present

## 2016-05-27 DIAGNOSIS — L03115 Cellulitis of right lower limb: Secondary | ICD-10-CM | POA: Diagnosis not present

## 2016-05-27 DIAGNOSIS — E785 Hyperlipidemia, unspecified: Secondary | ICD-10-CM | POA: Diagnosis not present

## 2016-05-27 DIAGNOSIS — Z87891 Personal history of nicotine dependence: Secondary | ICD-10-CM | POA: Diagnosis not present

## 2016-05-27 DIAGNOSIS — I2510 Atherosclerotic heart disease of native coronary artery without angina pectoris: Secondary | ICD-10-CM | POA: Diagnosis not present

## 2016-06-03 ENCOUNTER — Encounter: Payer: Self-pay | Admitting: *Deleted

## 2016-06-03 ENCOUNTER — Other Ambulatory Visit: Payer: Self-pay | Admitting: *Deleted

## 2016-06-03 NOTE — Patient Outreach (Signed)
[facility]) Care Management  [place] Routine Home Visit 06/03/2016  Susan [name] 05/01/1951 [id number]  Susan [name] is an 66 y.o. female well known to [place] for for self-management of chronic disease states of CHF and DM. Susan [name] has had 3 inpatient hospital visits since January 2017, but no hospital readmissions for the last 7-plusmonths.   Today, Susan [name] reports that she is doing "better than the last time you were here, but my sugars are still running wild."  -- DM: Susan [name] reports that she attended recent office visit with Dr. [name] on May 27, 2016 where her blood sugars were reviewed with Dr. [name] after she was started new insulin dosing on April 28, 2016.  Susan [name] reports that Dr. [name] again changed her sliding scale doses on 05/27/16 based on [name]'s ongoing high blood sugar readings:  NPH 60 U q am  Regular Insulin sliding scale dosing for breakfast/ lunch is now:  70- 100: 5 U 101-150: 12 U 151- 200: 18 U 201- 250: 24 U 251- 300: 28 U 301- 350: 32 U 350-400: 36 U > 400: 40 U  Regular Insulin sliding scale dosing for evening meal:  150- 200: 5 U 201- 250: 10 U 251- 300: 15 U 301- 350: 20 U > 350: 25 U  General range of recorded blood sugars since May 27, 2015:  273-279  Reviewed new insulin guidelines thoroughly with patient today.  Susan [name] reports that she has been contacting Dr. [name] as requested for high blood sugars that do not decrease within 24 hours, and positive reinforcement was provided to patient for doing so.  Susan [name] again reports no episodes of hypoglycemia. Extensive encouragement/ positive reinforcement provided to patient today as she becomes used tonew insulin regimen.  Sick day rules was again discussed with patient today.  -- CHF: Susan [name] reports that she has continued monitoring and recording daily weights; reports weight today of 279.7lbs (patient's scales at home), denies changes in breathing status, and  states that her legs "seem to look better," when wrapped by [facility] RN, although "my legs hurt when they are wrapped and tend to cramp."  Reports last [facility] visit for leg wrapping is scheduled for later today.  General weight range from patient's home scales reviewed with patient, from May 26, 2016, weight has ranged from 273- 279 lbs.    -- Medications:  All medications were reviewed with patient today.  Patient verbalizes good general understanding of medication purpose, dosing and scheduling of medications.    -- Provider appointments:  All upcoming provider appointments were reviewed with patient today, and patient confirms that she is aware of all provider appointments and plans to attend all as scheduled.  Next appointment with endocrinology on Sep 23, 2016, per report of patient.  Susan [name] denies further questions, concerns, issues or problems today, and I again encouraged her to contact her providers promptly for any new concerns/ questions that arise. We scheduled [name] in-home visit scheduled for next month.   Subjective: "I'm doing better than I have been, but my sugars are still all over the place."  Objective:    BP 110/62   Pulse 82   Resp 18   Wt 279 lb 11.2 oz (126.9 kg)   SpO2 97%   BMI 45.14 kg/m    Review of Systems  Constitutional: Negative for chills and fever.  Respiratory: Positive for shortness of breath. Negative for cough and wheezing.        SOB with  activity; recovers easily when resting after ambulation around home  Cardiovascular: Positive for leg swelling. Negative for chest pain and orthopnea.       Bilateral LE edema easily visible around unna boot wrapping  Gastrointestinal: Negative for abdominal pain and nausea.  Genitourinary: Positive for frequency and urgency.       Diuretic therapy  Musculoskeletal: Negative for falls.  Skin:       Wearing unna boot wrapping on legs bilaterally  Neurological: Negative.  Negative for weakness.   Psychiatric/Behavioral: Positive for depression. The patient is not nervous/anxious.     Physical Exam  Constitutional: She is oriented to person, place, and time. She appears well-developed and well-nourished. No distress.  Cardiovascular: Normal rate, regular rhythm, normal heart sounds and intact distal pulses.   Respiratory: Effort normal. No respiratory distress. She has no wheezes. She has no rales.  GI: Soft. Bowel sounds are normal.  Musculoskeletal: She exhibits edema.  Bilateral LE edema easily visible in skin around unna boot wrapping  Neurological: She is alert and oriented to person, place, and time.  Skin: Skin is warm and dry. There is erythema.  Bilateral LE erythema easily visible on skin exposed around unna boot wrapping  Psychiatric: She has a normal mood and affect. Her behavior is normal. Judgment and thought content normal.    Encounter Medications:   Outpatient Encounter Prescriptions as of 06/03/2016  Medication Sig Note  . albuterol (PROVENTIL HFA;VENTOLIN HFA) 108 (90 BASE) MCG/ACT inhaler Inhale 2 puffs into the lungs every 6 (six) hours as needed for wheezing or shortness of breath. Reported on 10/30/2015 05/06/2016: Reports uses rarely; last time used "last week"    . aspirin EC 81 MG tablet Take 81 mg by mouth daily.   . carvedilol (COREG) 25 MG tablet Take 1 tablet (25 mg total) by mouth 2 (two) times daily with a meal.   . clonazePAM (KLONOPIN) 1 MG tablet TAKE 1 TABLET BY MOUTH 3 TIMES A DAY   . clopidogrel (PLAVIX) 75 MG tablet TAKE 1 TABLET (75 MG TOTAL) BY MOUTH DAILY WITH BREAKFAST.   . ferrous sulfate 325 (65 FE) MG tablet Take 325 mg by mouth daily with breakfast. Reported on 08/21/2015   . insulin NPH-regular Human (NOVOLIN 70/30) (70-30) 100 UNIT/ML injection Inject 50-60 Units into the skin 2 (two) times daily with a meal. Inject 50 units in the morning and 60 units in the afternoon   . isosorbide dinitrate (ISORDIL) 10 MG tablet TAKE 1 TABLET (10 MG  TOTAL) BY MOUTH 2 (TWO) TIMES DAILY.   Marland Kitchen levothyroxine (SYNTHROID, LEVOTHROID) 50 MCG tablet Take 50 mcg by mouth daily before breakfast.   . losartan (COZAAR) 50 MG tablet TAKE 1 TABLET BY MOUTH DAILY   . metolazone (ZAROXOLYN) 2.5 MG tablet Take 2.5 mg by mouth once a week. Take 1 tablet every Wednesday   . nitroGLYCERIN (NITROSTAT) 0.4 MG SL tablet Place 1 tablet (0.4 mg total) under the tongue every 5 (five) minutes as needed for chest pain. (Patient not taking: Reported on 05/12/2016) 05/06/2016: Reports has not needed recently; has medication present in home but has not needed   . ONETOUCH VERIO test strip    . PARoxetine (PAXIL-CR) 25 MG 24 hr tablet Take 25 mg by mouth daily. Take with 25 mg tablet daily (total 62.5 mg)   . PARoxetine (PAXIL-CR) 37.5 MG 24 hr tablet Take 37.5 mg by mouth daily. Take with 25 mg tablet daily (total 62.5 mg)   . pregabalin (  LYRICA) 150 MG capsule Take 150 mg by mouth 3 (three) times daily. Reported on 10/31/2015   . torsemide (DEMADEX) 20 MG tablet Take 4 tablets (80 mg total) by mouth 2 (two) times daily.    No facility-administered encounter medications on file as of 06/03/2016.     Functional Status:   In your present state of health, do you have any difficulty performing the following activities: 02/26/2016 10/30/2015  Hearing? N N  Vision? N N  Difficulty concentrating or making decisions? Y N  Walking or climbing stairs? Y Y  Dressing or bathing? N Y  Doing errands, shopping? N N  Preparing Food and eating ? N N  Using the Toilet? N N  In the past six months, have you accidently leaked urine? Y Y  Do you have problems with loss of bowel control? N N  Managing your Medications? N N  Managing your Finances? N N  Housekeeping or managing your Housekeeping? N N  Some recent data might be hidden    Fall/Depression Screening:    PHQ 2/9 Scores 12/18/2015 10/19/2015 08/09/2015 08/07/2015 03/16/2015  PHQ - 2 Score 4 4 4 4  0  PHQ- 9 Score 13 16 16 16  0     Assessment:  Susan [name] continues to need reinforcement of self-health management of chronic disease states of CHF and DM, as well as with compliance to instructions provided to her regarding reasons to seek health care urgently/ emergently, and the complications that can result from non- compliance. Susan [name] does not have a strong support system within her family, and benefits from extrasupport and reinforcement from her healthcare providers.  Susan [name] is committed to compliance with her medication regimen and to attending scheduled provider appointments.  Plan:   Susan [name] will continue to take all of her medications as they are prescribed and will keep all scheduled provider appointments.  Susan [name] will call EMS for any urgent or emergent issues or concerns she experiences.Susan [name] will contact her care/ medical providers to report concerns with her blood sugars running high or low, signs and symptoms of fluid overload in the setting of CHF, or any falls she experiences.   Susan [name] will continue to monitor and record her daily weight and blood sugars, as she has been, and will take these values to ALLprovider appointments with her.  [place] outreach to continue with scheduled [place] routine home visit next month.   [name], RN, BSN, [employer] [facility] Care Management  [phone]

## 2016-06-04 ENCOUNTER — Encounter: Payer: Self-pay | Admitting: *Deleted

## 2016-06-04 ENCOUNTER — Telehealth (HOSPITAL_COMMUNITY): Payer: Self-pay | Admitting: *Deleted

## 2016-06-04 DIAGNOSIS — L03115 Cellulitis of right lower limb: Secondary | ICD-10-CM | POA: Diagnosis not present

## 2016-06-04 DIAGNOSIS — E1122 Type 2 diabetes mellitus with diabetic chronic kidney disease: Secondary | ICD-10-CM | POA: Diagnosis not present

## 2016-06-04 DIAGNOSIS — Z87891 Personal history of nicotine dependence: Secondary | ICD-10-CM | POA: Diagnosis not present

## 2016-06-04 DIAGNOSIS — F419 Anxiety disorder, unspecified: Secondary | ICD-10-CM | POA: Diagnosis not present

## 2016-06-04 DIAGNOSIS — I130 Hypertensive heart and chronic kidney disease with heart failure and stage 1 through stage 4 chronic kidney disease, or unspecified chronic kidney disease: Secondary | ICD-10-CM | POA: Diagnosis not present

## 2016-06-04 DIAGNOSIS — Z794 Long term (current) use of insulin: Secondary | ICD-10-CM | POA: Diagnosis not present

## 2016-06-04 DIAGNOSIS — E785 Hyperlipidemia, unspecified: Secondary | ICD-10-CM | POA: Diagnosis not present

## 2016-06-04 DIAGNOSIS — I872 Venous insufficiency (chronic) (peripheral): Secondary | ICD-10-CM | POA: Diagnosis not present

## 2016-06-04 DIAGNOSIS — I5033 Acute on chronic diastolic (congestive) heart failure: Secondary | ICD-10-CM | POA: Diagnosis not present

## 2016-06-04 DIAGNOSIS — I2510 Atherosclerotic heart disease of native coronary artery without angina pectoris: Secondary | ICD-10-CM | POA: Diagnosis not present

## 2016-06-04 DIAGNOSIS — Z7982 Long term (current) use of aspirin: Secondary | ICD-10-CM | POA: Diagnosis not present

## 2016-06-04 DIAGNOSIS — N183 Chronic kidney disease, stage 3 (moderate): Secondary | ICD-10-CM | POA: Diagnosis not present

## 2016-06-04 DIAGNOSIS — L03116 Cellulitis of left lower limb: Secondary | ICD-10-CM | POA: Diagnosis not present

## 2016-06-04 NOTE — Telephone Encounter (Signed)
HHRN called to report she removed pt's unna boots today, leg wounds are all dried up.  She also reports pt has gained 5 lbs in 3 days, she advised pt to take her metolazone, as pt is suppose to take it on Wed, however pt states she was going to town so she could not take it now she will take it later today or tomorrow.  She also states pt is generally non compliant and was drinking regular Pepsi when she arrived at the home today.  Pt is sch for f/u with [name] on Mon 1/15

## 2016-06-09 ENCOUNTER — Ambulatory Visit (HOSPITAL_COMMUNITY)
Admission: RE | Admit: 2016-06-09 | Discharge: 2016-06-09 | Disposition: A | Discharge status: Home / Self Care | Payer: PPO | Source: Ambulatory Visit | Attending: Cardiology | Admitting: Cardiology

## 2016-06-09 VITALS — BP 130/70 | HR 96 | Wt 286.8 lb

## 2016-06-09 DIAGNOSIS — R5383 Other fatigue: Secondary | ICD-10-CM | POA: Insufficient documentation

## 2016-06-09 DIAGNOSIS — I2510 Atherosclerotic heart disease of native coronary artery without angina pectoris: Secondary | ICD-10-CM | POA: Diagnosis not present

## 2016-06-09 DIAGNOSIS — E1122 Type 2 diabetes mellitus with diabetic chronic kidney disease: Secondary | ICD-10-CM | POA: Diagnosis not present

## 2016-06-09 DIAGNOSIS — Z794 Long term (current) use of insulin: Secondary | ICD-10-CM | POA: Insufficient documentation

## 2016-06-09 DIAGNOSIS — Z7982 Long term (current) use of aspirin: Secondary | ICD-10-CM | POA: Insufficient documentation

## 2016-06-09 DIAGNOSIS — I130 Hypertensive heart and chronic kidney disease with heart failure and stage 1 through stage 4 chronic kidney disease, or unspecified chronic kidney disease: Secondary | ICD-10-CM | POA: Diagnosis not present

## 2016-06-09 DIAGNOSIS — IMO0002 Reserved for concepts with insufficient information to code with codable children: Secondary | ICD-10-CM

## 2016-06-09 DIAGNOSIS — E1165 Type 2 diabetes mellitus with hyperglycemia: Secondary | ICD-10-CM | POA: Insufficient documentation

## 2016-06-09 DIAGNOSIS — N182 Chronic kidney disease, stage 2 (mild): Secondary | ICD-10-CM

## 2016-06-09 DIAGNOSIS — N183 Chronic kidney disease, stage 3 unspecified: Secondary | ICD-10-CM

## 2016-06-09 DIAGNOSIS — I878 Other specified disorders of veins: Secondary | ICD-10-CM | POA: Insufficient documentation

## 2016-06-09 DIAGNOSIS — R0600 Dyspnea, unspecified: Secondary | ICD-10-CM | POA: Insufficient documentation

## 2016-06-09 DIAGNOSIS — E669 Obesity, unspecified: Secondary | ICD-10-CM | POA: Insufficient documentation

## 2016-06-09 DIAGNOSIS — I5032 Chronic diastolic (congestive) heart failure: Secondary | ICD-10-CM | POA: Diagnosis not present

## 2016-06-09 DIAGNOSIS — I428 Other cardiomyopathies: Secondary | ICD-10-CM | POA: Diagnosis not present

## 2016-06-09 DIAGNOSIS — Z79899 Other long term (current) drug therapy: Secondary | ICD-10-CM | POA: Insufficient documentation

## 2016-06-09 DIAGNOSIS — Z7902 Long term (current) use of antithrombotics/antiplatelets: Secondary | ICD-10-CM | POA: Diagnosis not present

## 2016-06-09 NOTE — Patient Instructions (Signed)
Follow-up in 4 weeks

## 2016-06-09 NOTE — Progress Notes (Signed)
Patient ID: Susan [name], female   DOB: 20-Oct-1950, 66 y.o.   MRN: [id number]   PCP: Dr Edilia Bo  Cardiologist: Dr Myrtis Ser  Endocrinologist: Dr Lurene Shadow  Primary HF Cardiologist: Dr. Gala Romney  [facility] Patient  HPI: Susan [name] is a 66 year old with a history of RBBB, DM, Hypothyroid, diastolic heart failure, CAD MI 2000/2007 BMS 2007, TIA, obesity.   Admitted 06/07/15 with N,V, hyperglycemia. Creatinine was up to 1.9. On the day discharge creatinine was back down to 1.2 Thought to be dehydrated so diuretics were held. Diuretics restarted on the day discharge. Discharge weight 282 pounds.   Admitted 2/13 through 07/13/15 with gastroenteritis and hyper glycemia. Diuretic initially held due to dehydration. Losartan and metolazone stopped with AKI/CKD. Discharge weight was 283 pounds.   Admitted June 1st through June 3rd with altered mental status, hypothermia, and weakness. Diuretics initially held and later restarted. Unfortunately when she was discharged she refused home health.  She returns for HF follow up. Complaining fatigue and sore throat.  No fever or chills. SOB with exertion. Weight at home 275-283 pounds. Not moving around much.  Says she has been out of metolazone. Has not been following any type of diet. [name] follows monthly. Completed HH.  Legs sores improved.   ECHO 07/11/2015- 50-55%.  Grade I DD  RHC 09/22/13: RA = 4  RV = 30/5/7  PA = 25/10 (16)  PCW = 7  Fick cardiac output/index = 6.3/2.7  PVR = 1.5 WU   FA sat = 93%  PA sat = 61%, 66%   Labs: 07/18/13 K 4.8 Creatinine 1.38 Glucose 1005 she refused to go to [facility] Labs : 08/17/13 K 3.9 Creatinine 1.4 Glucose 499 Labs: 09/29/13 K 4.5 Creatinine 1.4  Labs 04/18/14 K 4.4 Creatinine 1.18 Labs 03/26/2015: K 5.3 Creatinine 1.41  Labs 05/01/2015: K 4.9 Creatinine 1.8 Labs 05/04/2015: K 5.0 Creatinine 1.74  Labs 06/10/2015: K 4.7 Creatinine 1.21 Labs 06/12/2015: K 6.3 Creatinine 2.01  Labs 06/19/2015: K 4.5 Creatinine 1.29    Labs 07/10/2015: Hgb A1C 9.5  Labs 07/12/2015: K 4.4 Creatinine 1.62 K 4.4  Labs 08/10/2015: K 5.3 Creatinine 1.44  Labs 10/03/2015: K 4.5 Creatinine 1.41  Labs 10/19/15: K 4.7 creatinine 1.75  Labs 10/28/2015: K 4.5 Creatinine 1.2  Labs 03/10/2016: K 3.7 Creatinine 2.04 Glucose 630  Labs 03/31/2016: K 3.3 Creatinine 2.13 Glucose 96 05/12/2016: K 3.7 Creatinine 2.5 Glucose 2.5    SH: Former smoker quit 15 year ago. Does drink alcohol. She is disabled. Lives at home with her husband and disabled son - Husband is truck Hospital doctor. Son has a brain injury after he attempted suicide  A few years ago. .    FH: Mom MI  ROS: All systems negative except as listed in HPI, PMH and Problem List.  Past Medical History:  Diagnosis Date  . Acute MI 1999; 2007  . Anemia    hx  . Anginal pain (HCC)   . Anxiety   . Arthritis    "generalized" (03/15/2014)  . CAD (coronary artery disease)    MI in 2000 - MI  2007 - treated bare metal stent (no nuclear since then as 9/11)  . Carotid artery disease (HCC)   . CHF (congestive heart failure) (HCC)   . Chronic diastolic heart failure (HCC)    a) ECHO (08/2013) EF 55-60% and RV function nl b) RHC (08/2013) RA 4, RV 30/5/7, PA 25/10 (16), PCWP 7, Fick CO/CI 6.3/2.7, PVR 1.5 WU, PA 61 and 66%  .  Daily headache    "~ every other day; since I fell in June" (03/15/2014)  . Depression   . Dyslipidemia   . Exertional shortness of breath   . HTN (hypertension)   . Hypothyroidism   . Obesity   . Osteoarthritis   . Peripheral neuropathy (HCC)   . PONV (postoperative nausea and vomiting)   . RBBB (right bundle branch block)    Old  . Renal insufficiency    DENIES  . Stroke [facility])    mini strokes  . Syncope    likely due to low blood sugar  . Tachycardia    Sinus tachycardia  . Type II diabetes mellitus (HCC)   . Urinary incontinence   . Venous insufficiency     Current Outpatient Prescriptions  Medication Sig Dispense Refill  . albuterol (PROVENTIL  HFA;VENTOLIN HFA) 108 (90 BASE) MCG/ACT inhaler Inhale 2 puffs into the lungs every 6 (six) hours as needed for wheezing or shortness of breath. Reported on 10/30/2015    . aspirin EC 81 MG tablet Take 81 mg by mouth daily.    . carvedilol (COREG) 25 MG tablet Take 1 tablet (25 mg total) by mouth 2 (two) times daily with a meal. 60 tablet 6  . clonazePAM (KLONOPIN) 1 MG tablet TAKE 1 TABLET BY MOUTH 3 TIMES A DAY 90 tablet 2  . clopidogrel (PLAVIX) 75 MG tablet TAKE 1 TABLET (75 MG TOTAL) BY MOUTH DAILY WITH BREAKFAST. 90 tablet 2  . ferrous sulfate 325 (65 FE) MG tablet Take 325 mg by mouth daily with breakfast. Reported on 08/21/2015    . insulin NPH-regular Human (NOVOLIN 70/30) (70-30) 100 UNIT/ML injection Inject 50-60 Units into the skin 2 (two) times daily with a meal. Inject 50 units in the morning and 60 units in the afternoon    . isosorbide dinitrate (ISORDIL) 10 MG tablet TAKE 1 TABLET (10 MG TOTAL) BY MOUTH 2 (TWO) TIMES DAILY. 60 tablet 10  . levothyroxine (SYNTHROID, LEVOTHROID) 50 MCG tablet Take 50 mcg by mouth daily before breakfast.    . losartan (COZAAR) 50 MG tablet TAKE 1 TABLET BY MOUTH DAILY 90 tablet 0  . metolazone (ZAROXOLYN) 2.5 MG tablet Take 2.5 mg by mouth once a week. Take 1 tablet every Wednesday    . nitroGLYCERIN (NITROSTAT) 0.4 MG SL tablet Place 1 tablet (0.4 mg total) under the tongue every 5 (five) minutes as needed for chest pain. 25 tablet 3  . ONETOUCH VERIO test strip     . PARoxetine (PAXIL-CR) 25 MG 24 hr tablet Take 25 mg by mouth daily. Take with 25 mg tablet daily (total 62.5 mg)    . PARoxetine (PAXIL-CR) 37.5 MG 24 hr tablet Take 37.5 mg by mouth daily. Take with 25 mg tablet daily (total 62.5 mg)    . pregabalin (LYRICA) 150 MG capsule Take 150 mg by mouth 3 (three) times daily. Reported on 10/31/2015    . torsemide (DEMADEX) 20 MG tablet Take 4 tablets (80 mg total) by mouth 2 (two) times daily. 240 tablet 3   No current facility-administered  medications for this encounter.     Vitals:   06/09/16 1416  BP: 130/70  BP Location: Right Arm  Patient Position: Sitting  Cuff Size: Large  Pulse: 96  SpO2: 95%  Weight: 286 lb 12.8 oz (130.1 kg)   Wt Readings from Last 3 Encounters:  06/09/16 286 lb 12.8 oz (130.1 kg)  06/03/16 279 lb 11.2 oz (126.9 kg)  05/12/16  283 lb (128.4 kg)    Chest circumference 134 cm   PHYSICAL EXAM: General:  Chronically ill appearing. Walked in the clinic.     HEENT: normal Neck: supple. JVP hard to assess due to thick neck. Does not appear elevated.  Carotids 2+ bilaterally; no bruits. No thyromegaly or nodule noted. Cor: PMI normal. RRR. No M/G/R Lungs: CTAB Abdomen: obese. Non tender. No bruits or masses. +BS  Extremities: no cyanosis, clubbing, rash, RLE-LLE 1+ edema. Skin warm.    Neuro: A & O x3. Moves all 4 extremities w/o difficulty. Affect pleasant.   ASSESSMENT & PLAN: 1. Chronic Diastolic Heart Failure: NICM, EF 55-60% Grade II DD(06/2015 )  -NYHA III symptoms. Volume status  Mildly elevated but not surprising as she was not taking metolazone.  Continue torsemide 80 mg twice a day. Today I have asked her to pick up metolazone and take weekly as ordered.    -Off potassium now with recent high K.   - Continue losartan 50 mg daily. - Continue coreg  25 mg twice a day.   Continue current dose of ARB, spiro, and torsemide.   Reinforced importance of daily weights, low sodium diet, and fluid restriction. Instructed to call the HF clinic if weight increases more than 3 lbs overnight or 5 lbs in a week -Discussed cardiomems. Will start the approval process for cardiomems.  2.. Dyspnea - Ok today. At baseline. Refuses sleep study.  3. Uncontrolled DM:   - Ongoing difficulty with glucose. Followed closely by Dr Lurene Shadow.   4. CAD - No CP or ACS symptoms.  - Continue ASA 81 mg daily. 5. Obesity: - Again reviewed portion control and importance of weight loss.   6. HTN - Stable. No change.    7. CKD Stage III 8. Day time fatigue - Refuses sleep study.   9. Chronic Venous Stasis 10. LLE - Cellulitis resolved.   Follow  Up in 4 weeks.     Arvie Villarruel NP-C   2:25 PM

## 2016-06-10 ENCOUNTER — Other Ambulatory Visit (HOSPITAL_COMMUNITY): Payer: Self-pay | Admitting: Internal Medicine

## 2016-06-10 ENCOUNTER — Other Ambulatory Visit: Payer: Self-pay | Admitting: Physician Assistant

## 2016-06-10 ENCOUNTER — Other Ambulatory Visit: Payer: Self-pay | Admitting: Cardiovascular Disease

## 2016-06-10 DIAGNOSIS — F329 Major depressive disorder, single episode, unspecified: Secondary | ICD-10-CM

## 2016-06-10 DIAGNOSIS — F419 Anxiety disorder, unspecified: Secondary | ICD-10-CM

## 2016-06-10 DIAGNOSIS — F32A Depression, unspecified: Secondary | ICD-10-CM

## 2016-06-13 ENCOUNTER — Other Ambulatory Visit: Payer: Self-pay | Admitting: Licensed Clinical Social Worker

## 2016-06-13 ENCOUNTER — Other Ambulatory Visit (HOSPITAL_COMMUNITY): Payer: Self-pay | Admitting: Adult Health

## 2016-06-13 DIAGNOSIS — L98499 Non-pressure chronic ulcer of skin of other sites with unspecified severity: Secondary | ICD-10-CM

## 2016-06-13 NOTE — Patient Outreach (Signed)
Assessment:  CSW spoke via phone with client. CSW verified client identity. CSW and client spoke of client needs. Client sees [name], Actor, for medical care. Client said she had her prescribed medications and is taking medications as prescribed.  Client often drives herself to scheduled medical appointments. [facility] [place], [place]. Client receives [facility] nursing support with Reginia Naas RN. Client has walker and a cane she is able to use for ambulation assistance as needed.  Client has [employer]. CSW and client spoke of client care plan. CSW encouraged client to attend all scheduled client medical appointments in next 30 days.  Client sees Dr. Soyla Murphy as endocrinologist. Client had an appointment with Dr. Soyla Murphy on 05/27/16.  Client has spoken with Reginia Naas RN several times about client's calling 911 as needed for client for emergency needs of client. Client is trying to attend all scheduled client medical appointments. CSW encouraged client to call CSW at [phone] as needed to discuss social work needs of client.    Plan:  Client to attend all scheduled client medical appointments in next 30 days.  CSW to collaborate with RN Reginia Naas as needed in monitoring needs of client.  CSW to call client in 4 weeks to assess client needs at that time.   [name].Axl Rodino MSW, LCSW Licensed Clinical Social Worker [facility] Care Management [phone]

## 2016-06-17 ENCOUNTER — Ambulatory Visit (HOSPITAL_COMMUNITY)
Admission: RE | Admit: 2016-06-17 | Discharge: 2016-06-17 | Disposition: A | Discharge status: Home / Self Care | Payer: PPO | Source: Ambulatory Visit | Attending: Cardiovascular Disease | Admitting: Cardiovascular Disease

## 2016-06-17 ENCOUNTER — Inpatient Hospital Stay (HOSPITAL_COMMUNITY): Admission: RE | Admit: 2016-06-17 | Payer: PPO | Source: Ambulatory Visit

## 2016-06-17 DIAGNOSIS — L98499 Non-pressure chronic ulcer of skin of other sites with unspecified severity: Secondary | ICD-10-CM | POA: Diagnosis not present

## 2016-06-17 DIAGNOSIS — L0390 Cellulitis, unspecified: Secondary | ICD-10-CM | POA: Insufficient documentation

## 2016-06-17 DIAGNOSIS — M79609 Pain in unspecified limb: Secondary | ICD-10-CM | POA: Diagnosis not present

## 2016-06-21 ENCOUNTER — Other Ambulatory Visit: Payer: Self-pay | Admitting: Physician Assistant

## 2016-06-21 DIAGNOSIS — F419 Anxiety disorder, unspecified: Secondary | ICD-10-CM

## 2016-06-21 DIAGNOSIS — F329 Major depressive disorder, single episode, unspecified: Secondary | ICD-10-CM

## 2016-06-21 DIAGNOSIS — F32A Depression, unspecified: Secondary | ICD-10-CM

## 2016-07-01 ENCOUNTER — Encounter: Payer: Self-pay | Admitting: *Deleted

## 2016-07-01 ENCOUNTER — Other Ambulatory Visit: Payer: Self-pay | Admitting: *Deleted

## 2016-07-01 NOTE — Patient Outreach (Signed)
Susan [name]) Care Management  [place] Routine Home Visit 07/01/2016  Susan [name] [birth year] [id number]  Susan [name] is an 66 y.o. female well known to Susan [name] for for self-management of chronic disease states of CHF and DM. Susan [name] has had 3 inpatient [name] visits since January 2017, but no [name] readmissions for the last 8-plusmonths.   Today, Susan [name] reports that she "hasn't been feeling well for days," stating that she has not been able to eat "since Friday" and that when she does eat, "it goes straight through me," reporting diarrhea, but no vomiting.  Susan [name] does not appear to be in acute distress today, and actually looks the best I have witnessed since I met her approximately one year ago when Susan [name] became active in her care.   -- DM: last office visit with Dr. Chalmers [name] on May 27, 2016; has generally been following  new insulin guidelines from January 2018 visit, although over last few days, she has not been checking her blood sugars more than once a day, and because she is having diarrhea/ not able to eat, has not been taking her sliding scale insulin on "many of those days."  Susan [name] again reports no episodes of hypoglycemia, and is able to verbalize signs/ symptoms of hypoglycemia as well as action plan for hypoglycemia. Sick day rules were again thoroughly discussed with patient today, including to reduce sliding scale insulin by one-half when she is not eating, and increasing blood sugar checks when she feels sickly.  Recorded blood sugar ranges since last [name] Home visit 06/03/16: 165-568  Fasting this morning:  165 Noted that several days since Friday 06/27/16, patient checked her blood sugars only one time per day; patient was counseled to resume checking/ recording blood sugars TID and more often if she continues to feel sick.  Susan [name] reports she "was just too sick" to check her blood sugars more than once per day.  -- CHF: Susan [name] reports that  she has continued monitoring and recording daily weights; reports weight today of 280lbs (patient's scales at home), denies changes in breathing status, and states that her legs "are much better," and that she believes she is able to walk better now that her legs are better.  I agree with patient that her legs look the BEST I have seen since I became involved in patient's care almost one year ago; additionally, I agree that she is walking much better and is more steady that I have ever witnessed with her before, without the use of an assistive device.  General weight range from patient's home scales reviewed with patient, since June 03, 2016, weight has ranged from 274- 287 lbs.  BP low today at 98/60; patient asymptomatic  ** Today, Susan [name] reported an episode "last week" where she had chest pain and tightness that spread across her chest and into her jaw; Susan [name] stated that she took a total of 2 NTG SL over the course of "10 or 15 minutes" and the pain "eventually got better."  Denies further episodes of chest pain/ tightness since then **  --Medications: All medications were reviewed with patient today. Patient verbalizes good general understanding of medication purpose, dosing and scheduling of medications. Patient confirmed that she has resumed taking metolazone once/ week, as instructed by Susan [name], Susan [name] during last office visit 06/09/16.  -- Provider appointments: All upcoming provider appointments were reviewed with patient today, and patient confirms that she is aware of all provider appointments and plans  to attend all as scheduled.  Next appointment with endocrinology on Sep 23, 2016, per report of patient.  I had a long discussion with Susan [name] today regarding the need for her to notify her providers when she is not feeling well, as well as the importance of her adhering to her established plan of care.  Susan [name] again states that she "doesn't want to bother anyone," stating that she  "knows everyone is so busy."  I explained that her providers WANT to know when she is having problems; she again expressed that she does not believe that her PCP "likes her."  I discussed with Susan [name] the possibility of changing PCP's if that would help her feel more comfortable reaching out when she feels sick, and Susan [name] agreed to consider this option, although she acknowledged that she "feels stuck" because of the geographical proximity of her current provider to her home; Susan [name] wishes to keep her doctors as close to her home as possible, as she continues to drive herself to her provider appointments.  Susan [name] vehemently declined my offer to contact her PCP today on her behalf to report her sick symptoms, stating she would call herself "if she is not better soon."   Susan [name] denies further questions, concerns, issues or problems today, and I again encouraged her to contact her providers promptly for any new concerns/ questions that arise. We scheduled [name] in-home visit scheduled for next month.  Subjective: "My legs are looking better, and I think I am walking better, but I have not been feeling good since last Friday; I don't want to eat because I can't seem to keep anything down-- I have been having diarrhea anytime I eat, as soon as I eat, for days now and my stomach feels like it is knots."   Objective:    BP 98/60   Pulse 78   Resp 18   Wt 280 lb 1.6 oz (127.1 kg)   BMI 45.21 kg/m    Review of Systems  Constitutional: Positive for malaise/fatigue. Negative for fever.  Respiratory: Positive for shortness of breath. Negative for wheezing.        Baseline shortness of breath; unable to obtain accurate pulse ox reading due to patient's nail polish; no acute distress when walking around home today  Cardiovascular: Positive for leg swelling. Negative for chest pain.       Bilateral LE much improved since last [facility] home visit 06/03/16; minimal edema noted in LE/ shins; +1 noted in feet/  ankles bilaterally.  Chronic erythema noted bilaterally in LE, but is improved from last [facility] visit; one small non-oozing open area noted on (R) anterior LE; skin otherwise intact    Gastrointestinal: Positive for diarrhea. Negative for abdominal pain, nausea and vomiting.       Reports has not been able to eat much since Friday 06/27/16; states has been "queasy on and off," having diarrhea, no vomiting  Genitourinary: Positive for frequency and urgency.       Diuretic therapy  Musculoskeletal: Negative for falls.  Neurological: Positive for dizziness and tingling.       History neuropathy; reports occasional dizziness since Friday 06/27/16 while she has been feeling "under the weather."   Psychiatric/Behavioral: Positive for depression. The patient is not nervous/anxious.     Physical Exam  Constitutional: She is oriented to person, place, and time. She appears well-developed and well-nourished. No distress.    Cardiovascular: Normal rate, regular rhythm, normal heart sounds and intact distal pulses.   Pulses:  Radial pulses are 1+ on the right side, and 1+ on the left side.       Dorsalis pedis pulses are 1+ on the right side, and 1+ on the left side.  Distant heart sounds  Respiratory: Effort normal. No respiratory distress. She has no wheezes. She has no rales.  Baseline SOB  GI: Soft. Bowel sounds are normal.  Decreased bowel sounds; patient reporting that she has not been eating much secondary to reported diarrhea over several days prior  Musculoskeletal: She exhibits edema.  See ROS  Neurological: She is alert and oriented to person, place, and time.  Skin: Skin is warm and dry. There is erythema.  Psychiatric: She has a normal mood and affect. Her behavior is normal. Thought content normal.   Assessment:  Susan [name] continues to need reinforcement of self-health management of chronic disease states of CHF and DM, as well as with compliance with instructions provided to her regarding  reasons to seek health care urgently/ emergently, and the complications that can result from non- compliance. Susan [name] repeatedly verbalizes that she does not wish to "bother" her providers when she feels sick, as well as that she believes her current PCP does not "like" her.  Susan [name] does not have a strong support system within her family, and benefits from extrasupport and reinforcement from her healthcare providers. Susan [name] is committed to compliance with her medication regimen and to attending scheduled provider appointments, and although she states today that she feels poorly, she looks better today from an overall standpoint than she has in the last several (previous) [place] home visits.  Plan:   Susan [name] will continue to take all of her medications as they are prescribed and will keep all scheduled provider appointments.  Susan [name] will call EMS for any urgent or emergent issues or concerns she experiences.Susan [name] will contact her care/ medical providers to report concerns with her blood sugars running high or low, signs and symptoms of fluid overload in the setting of CHF, any falls or feelings of illness she experiences.   Susan [name] will continue to monitor and record her daily weight and blood sugars TID, and will take these values to ALLprovider appointments with her.  [place] outreach to continue with scheduled [place] telephone outreach in 2 weeks and routine home visit next month.  Susan Rack, RN, BSN, [employer] Coordinator [facility] Care Management  [phone]   [facility] [name] Care Plan Problem One   Flowsheet Row Most Recent Value  Care Plan Problem One  Patient hesitation in contacting providers when she feels sick  Role Documenting the Problem One  Care Management [place] for Problem One  Active  [name] Long Term Goal (31-90 days)  Over the next 60 days, patient will verbalize reasons she should contact her providers urgently,  as evidenced by patient reporting during [facility] RN [name] outreach  [name] Long Term Goal Start Date  07/01/16  Interventions for Problem One Long Term Goal  Utilizing teachback method, discussed with patient reasons that she should contact her providers when she is not feeling well,  encouraged patient that her providers care about her and want her to notify them if she is not well  [name] Short Term Goal #1 (0-30 days)  Over the next 30 days, patient will consider changing her PCP  [facility] [name] Short Term Goal #1 Start Date  07/01/16  Interventions for Short Term Goal #1  Utilizing teachback method, discussed with  patient why she does not wish to contact her providers when she is not feeling well,  discussed importance of being able to honestly talk with medical providers    [place] [name] [name] Care Plan Problem Two   Flowsheet Row Most Recent Value  Care Plan Problem Two  Knowledge deficit related to sick day rules/ plan for self-health management of chronic disease state of DM  Role Documenting the Problem Two  Care Management Coordinator  Care Plan for Problem Two  Active  Interventions for Problem Two Long Term Goal   Utilizing teachback method, reiterated self-health management of DM, reviewed with patient today new insulin dosing guidelines provided yesterday by Dr. Chalmers [name], blood sugars, and overall plan for self-health management of chronic disease state of DM.  Encouraged patient to continue maintaining contact with Dr. Chalmers [name] to make her aware of patient's high blood sugar readings as well as any symptoms patient experiences  [name] Long Term Goal (31-90) days  Over the next 90 days, patient will be able to verbalize plan for daily self-care of DM based on home blood sugar readings, as evidenced by patient reporting during [facility] [name] outreach  [place] [name] Long Term Goal Start Date  04/03/16  [name] Short Term Goal #1 (0-30 days)  Over the next 5 days, patient will consider option of [facility] RN [name] attending upcoming endocrinology  appointment with her, as evidenced by patient reporting during next scheduled [name] in-home visit  [name] Short Term Goal #1 Start Date  04/03/16  [facility] [name] Short Term Goal #1 Met Date   04/07/16  Interventions for Short Term Goal #2   Utilizing teachback method, discussed with patient value of having additional support present at upcoming endocrinology appointment  [facility] [name] [name] Short Term Goal #2 (0-30 days)  Over the next 30 days, patient will be able to verbalize symptoms of hyper- and hypo- glycemia, as evidenced by patient reporting during [name] outreach  [name] [name] Short Term Goal #2 Start Date  06/03/16  [facility] [name] [name] Short Term Goal #2 Met Date  07/01/16  Interventions for Short Term Goal #2  Utilizing teachback method, reiterated education on symptoms of both hyper- and hypo- glycemia states  [name] Short Term Goal #3 (0-30 days)  Over the next 30 days, patient will be able to verbalize sick day rules for insulin administration, as evidenced by patient reporting during [name] RN [name] outreach  [facility] [name] Short Term Goal #3 Start Date  07/01/16  Interventions for Short Term Goal #3  Utilizing teachback method, thoroughly discussed with patient importance of kowing what to do with ss insulin dosing when she is not feeling weel

## 2016-07-08 ENCOUNTER — Encounter (HOSPITAL_COMMUNITY): Payer: Self-pay

## 2016-07-08 ENCOUNTER — Ambulatory Visit (HOSPITAL_COMMUNITY)
Admission: RE | Admit: 2016-07-08 | Discharge: 2016-07-08 | Disposition: A | Discharge status: Home / Self Care | Payer: PPO | Source: Ambulatory Visit | Attending: Cardiology | Admitting: Cardiology

## 2016-07-08 ENCOUNTER — Telehealth (HOSPITAL_COMMUNITY): Payer: Self-pay | Admitting: *Deleted

## 2016-07-08 VITALS — BP 124/74 | HR 83 | Wt 280.5 lb

## 2016-07-08 DIAGNOSIS — I878 Other specified disorders of veins: Secondary | ICD-10-CM | POA: Insufficient documentation

## 2016-07-08 DIAGNOSIS — I429 Cardiomyopathy, unspecified: Secondary | ICD-10-CM | POA: Diagnosis not present

## 2016-07-08 DIAGNOSIS — Z7982 Long term (current) use of aspirin: Secondary | ICD-10-CM | POA: Insufficient documentation

## 2016-07-08 DIAGNOSIS — Z794 Long term (current) use of insulin: Secondary | ICD-10-CM | POA: Insufficient documentation

## 2016-07-08 DIAGNOSIS — N183 Chronic kidney disease, stage 3 unspecified: Secondary | ICD-10-CM

## 2016-07-08 DIAGNOSIS — I5032 Chronic diastolic (congestive) heart failure: Secondary | ICD-10-CM | POA: Diagnosis not present

## 2016-07-08 DIAGNOSIS — E669 Obesity, unspecified: Secondary | ICD-10-CM | POA: Insufficient documentation

## 2016-07-08 DIAGNOSIS — E1165 Type 2 diabetes mellitus with hyperglycemia: Secondary | ICD-10-CM | POA: Diagnosis not present

## 2016-07-08 DIAGNOSIS — I130 Hypertensive heart and chronic kidney disease with heart failure and stage 1 through stage 4 chronic kidney disease, or unspecified chronic kidney disease: Secondary | ICD-10-CM | POA: Insufficient documentation

## 2016-07-08 DIAGNOSIS — IMO0002 Reserved for concepts with insufficient information to code with codable children: Secondary | ICD-10-CM

## 2016-07-08 DIAGNOSIS — E1122 Type 2 diabetes mellitus with diabetic chronic kidney disease: Secondary | ICD-10-CM | POA: Insufficient documentation

## 2016-07-08 DIAGNOSIS — N182 Chronic kidney disease, stage 2 (mild): Secondary | ICD-10-CM | POA: Diagnosis not present

## 2016-07-08 DIAGNOSIS — I2510 Atherosclerotic heart disease of native coronary artery without angina pectoris: Secondary | ICD-10-CM | POA: Diagnosis not present

## 2016-07-08 DIAGNOSIS — Z79899 Other long term (current) drug therapy: Secondary | ICD-10-CM | POA: Insufficient documentation

## 2016-07-08 DIAGNOSIS — Z5189 Encounter for other specified aftercare: Secondary | ICD-10-CM | POA: Insufficient documentation

## 2016-07-08 LAB — BASIC METABOLIC PANEL
Anion gap: 15 (ref 5–15)
BUN: 62 mg/dL — ABNORMAL HIGH (ref 6–20)
CO2: 25 mmol/L (ref 22–32)
Calcium: 8.9 mg/dL (ref 8.9–10.3)
Chloride: 87 mmol/L — ABNORMAL LOW (ref 101–111)
Creatinine, Ser: 2.27 mg/dL — ABNORMAL HIGH (ref 0.44–1.00)
GFR calc Af Amer: 25 mL/min — ABNORMAL LOW (ref >60)
GFR calc non Af Amer: 21 mL/min — ABNORMAL LOW (ref >60)
Glucose, Bld: 649 mg/dL (ref 65–99)
Potassium: 3.8 mmol/L (ref 3.5–5.1)
Sodium: 127 mmol/L — ABNORMAL LOW (ref 135–145)

## 2016-07-08 NOTE — Patient Instructions (Signed)
Routine lab work today. Will notify you of abnormal results  Follow up with Amy in 6 weeks.

## 2016-07-08 NOTE — Progress Notes (Signed)
fPatient ID: Susan [name], female   DOB: 05-Jan-1951, 66 y.o.   MRN: [id number]   PCP: Dr Edilia Bo  Cardiologist: Dr Myrtis Ser  Endocrinologist: Dr Lurene Shadow  Primary HF Cardiologist: Dr. Gala Romney  [facility] Patient  HPI: Susan [name] is a 66 year old with a history of RBBB, DM, Hypothyroid, diastolic heart failure, CAD MI 2000/2007 BMS 2007, TIA, obesity.   Admitted 06/07/15 with N,V, hyperglycemia. Creatinine was up to 1.9. On the day discharge creatinine was back down to 1.2 Thought to be dehydrated so diuretics were held. Diuretics restarted on the day discharge. Discharge weight 282 pounds.   Admitted 2/13 through 07/13/15 with gastroenteritis and hyper glycemia. Diuretic initially held due to dehydration. Losartan and metolazone stopped with AKI/CKD. Discharge weight was 283 pounds.   Admitted June 1st through June 3rd with altered mental status, hypothermia, and weakness. Diuretics initially held and later restarted. Unfortunately when she was discharged she refused home health.  She returns for HF follow up. Last visit metolazone was reordered weekly. Overall feeling just ok. Having difficulty managing diabetes. Glucose at home has been > 400. Having difficulty managing HF. Appetite poor. Weight at home 280-288 pounds. Taking all medications. Lives with her husband.    ECHO 07/11/2015- 50-55%.  Grade I DD  RHC 09/22/13: RA = 4  RV = 30/5/7  PA = 25/10 (16)  PCW = 7  Fick cardiac output/index = 6.3/2.7  PVR = 1.5 WU   FA sat = 93%  PA sat = 61%, 66%   Labs 03/10/2016: K 3.7 Creatinine 2.04 Glucose 630  Labs 03/31/2016: K 3.3 Creatinine 2.13 Glucose 96 05/12/2016: K 3.7 Creatinine 2.5 Glucose 2.5    SH: Former smoker quit 15 year ago. Does drink alcohol. She is disabled. Lives at home with her husband and disabled son - Husband is truck Hospital doctor. Son has a brain injury after he attempted suicide  A few years ago. .    FH: Mom MI  ROS: All systems negative except as listed in HPI, PMH and Problem  List.  Past Medical History:  Diagnosis Date  . Acute MI 1999; 2007  . Anemia    hx  . Anginal pain (HCC)   . Anxiety   . Arthritis    "generalized" (03/15/2014)  . CAD (coronary artery disease)    MI in 2000 - MI  2007 - treated bare metal stent (no nuclear since then as 9/11)  . Carotid artery disease (HCC)   . CHF (congestive heart failure) (HCC)   . Chronic diastolic heart failure (HCC)    a) ECHO (08/2013) EF 55-60% and RV function nl b) RHC (08/2013) RA 4, RV 30/5/7, PA 25/10 (16), PCWP 7, Fick CO/CI 6.3/2.7, PVR 1.5 WU, PA 61 and 66%  . Daily headache    "~ every other day; since I fell in June" (03/15/2014)  . Depression   . Dyslipidemia   . Exertional shortness of breath   . HTN (hypertension)   . Hypothyroidism   . Obesity   . Osteoarthritis   . Peripheral neuropathy (HCC)   . PONV (postoperative nausea and vomiting)   . RBBB (right bundle branch block)    Old  . Renal insufficiency    DENIES  . Stroke [facility])    mini strokes  . Syncope    likely due to low blood sugar  . Tachycardia    Sinus tachycardia  . Type II diabetes mellitus (HCC)   . Urinary incontinence   . Venous insufficiency  Current Outpatient Prescriptions  Medication Sig Dispense Refill  . aspirin EC 81 MG tablet Take 81 mg by mouth daily.    . carvedilol (COREG) 25 MG tablet Take 1 tablet (25 mg total) by mouth 2 (two) times daily with a meal. 60 tablet 6  . clonazePAM (KLONOPIN) 1 MG tablet TAKE 1 TABLET BY MOUTH 3 TIMES A DAY 90 tablet 2  . clopidogrel (PLAVIX) 75 MG tablet TAKE 1 TABLET (75 MG TOTAL) BY MOUTH DAILY WITH BREAKFAST. 90 tablet 2  . ferrous sulfate 325 (65 FE) MG tablet Take 325 mg by mouth daily with breakfast. Reported on 08/21/2015    . insulin NPH-regular Human (NOVOLIN 70/30) (70-30) 100 UNIT/ML injection Inject 50-60 Units into the skin 2 (two) times daily with a meal. Inject 50 units in the morning and 60 units in the afternoon    . isosorbide dinitrate (ISORDIL) 10 MG  tablet TAKE 1 TABLET BY MOUTH TWICE A DAY 60 tablet 6  . levothyroxine (SYNTHROID, LEVOTHROID) 50 MCG tablet Take 50 mcg by mouth daily before breakfast.    . losartan (COZAAR) 50 MG tablet TAKE 1 TABLET BY MOUTH DAILY 90 tablet 0  . metolazone (ZAROXOLYN) 2.5 MG tablet Take 2.5 mg by mouth once a week. Take 1 tablet every Wednesday    . ONETOUCH VERIO test strip     . PARoxetine (PAXIL-CR) 25 MG 24 hr tablet Take 25 mg by mouth daily. Take with 25 mg tablet daily (total 62.5 mg)    . PARoxetine (PAXIL-CR) 37.5 MG 24 hr tablet Take 37.5 mg by mouth daily. Take with 25 mg tablet daily (total 62.5 mg)    . pregabalin (LYRICA) 150 MG capsule Take 150 mg by mouth 3 (three) times daily. Reported on 10/31/2015    . torsemide (DEMADEX) 20 MG tablet Take 4 tablets (80 mg total) by mouth 2 (two) times daily. 240 tablet 3  . albuterol (PROVENTIL HFA;VENTOLIN HFA) 108 (90 BASE) MCG/ACT inhaler Inhale 2 puffs into the lungs every 6 (six) hours as needed for wheezing or shortness of breath. Reported on 10/30/2015    . nitroGLYCERIN (NITROSTAT) 0.4 MG SL tablet Place 1 tablet (0.4 mg total) under the tongue every 5 (five) minutes as needed for chest pain. (Patient not taking: Reported on 07/08/2016) 25 tablet 3   No current facility-administered medications for this encounter.     Vitals:   07/08/16 1340  BP: 124/74  Pulse: 83  SpO2: 98%  Weight: 280 lb 8 oz (127.2 kg)   Wt Readings from Last 3 Encounters:  07/08/16 280 lb 8 oz (127.2 kg)  07/01/16 280 lb 1.6 oz (127.1 kg)  06/09/16 286 lb 12.8 oz (130.1 kg)    Chest circumference 134 cm   PHYSICAL EXAM: General:  NAD. Marland Kitchen     HEENT: normal Neck: supple. JVP does not appear elevated.   Carotids 2+ bilaterally; no bruits. No thyromegaly or nodule noted. Cor: PMI normal. RRR. No M/G/R Lungs: CTAB.  Abdomen: obese. NT, ND. No bruits or masses. +BS  Extremities: no cyanosis, clubbing, rash, RLE-LLE trace-1+ edema. Skin warm   Neuro: A & O x3. Moves all  4 extremities w/o difficulty. Affect pleasant.   ASSESSMENT & PLAN: 1. Chronic Diastolic Heart Failure: NICM, EF 55-60% Grade II DD(06/2015 )  -NYHA III symptoms. Volume status stable. Continue torsemide  Mildly elevated but not surprising as she was not taking metolazone.  Continue torsemide 80 mg twice a day+ metolazone once a  Week.  -  Continue losartan 50 mg daily. - Continue coreg  25 mg twice a day.   Continue current dose of ARB, and torsemide.   Would not be surprised if she will require hospitalization for volume overload.  She is open to cardiomems but would like to hold off for now.  Do the following things EVERYDAY: 1) Weigh yourself in the morning before breakfast. Write it down and keep it in a log. 2) Take your medicines as prescribed 3) Eat low salt foods-Limit salt (sodium) to 2000 mg per day.  4) Stay as active as you can everyday 5) Limit all fluids for the day to less than 2 liters 2.. Dyspnea Unchanged. Refuses sleep study.  3. Uncontrolled DM:   - Glucose at home > 400. Foallowed closely by Dr Lurene Shadow.   4. CAD - No CP. Continue ASA 81 mg daily. 5. Obesity: - Again reviewed portion control and importance of weight loss.   6. HTN Stable. No change.  7. CKD Stage III 8. Day time fatigue - Refuses sleep study.   9. Chronic Venous Stasis 10. H/O Hypoerkalemia- off spiro. Check BMET today.     Follow  Up in 6 weeks.     Kadelyn Dimascio NP-C   1:55 PM

## 2016-07-08 NOTE — Telephone Encounter (Signed)
Lab called with critical glucose level on patient of 649.  Darrick Grinder, NP notified and advises patient to go to the Emergency Room.   Called patient on home and cell phone and left VM asking for her to call us back regarding glucose level.

## 2016-07-14 ENCOUNTER — Encounter: Payer: Self-pay | Admitting: *Deleted

## 2016-07-14 ENCOUNTER — Other Ambulatory Visit: Payer: Self-pay | Admitting: *Deleted

## 2016-07-14 NOTE — Patient Outreach (Signed)
[facility]) Care Management [place] Telephone Outreach  07/14/2016  [name] [birth year] [id number]  Unsuccessful telephone outreach to Susan [name], 66 y.o. female well known to [place] for for self-management of chronic disease states of CHF and DM. [name] has had 3 inpatient hospital visits since January 2017, but no hospital readmissions for the last 9-plusmonths.   HIPAA compliant voice mail message left for patient, requesting return call back.  Plan:  Will re-attempt [place] telephone outreach later this week if I do not hear back from patient first.   [name], RN, BSN, [place] Coordinator [facility] Care Management  [phone]

## 2016-07-14 NOTE — Addendum Note (Signed)
Encounter addended by: Conrad Holloway, NP on: 07/14/2016  3:40 PM<BR>    Actions taken: LOS modified, Follow-up modified

## 2016-07-15 ENCOUNTER — Other Ambulatory Visit: Payer: Self-pay | Admitting: Licensed Clinical Social Worker

## 2016-07-15 NOTE — Patient Outreach (Signed)
Assessment:  CSW spoke via phone with client. CSW verified client identity. CSW and client spoke of client needs.Client sees [name], 49 Asssistant Certified for medical care. Client is trying to attend medical appointments as scheduled.  Client drives herself to scheduled local medical appointments. She said she had her prescribed medications and is taking medications as prescribed. Client has [place] support with Reginia Naas RN. Client [facility] [place], [place]. Client has a walker and a cane she can use for ambulation assistance as needed. Client has [employer]. CSW and client spoke of client care plan. CSW encouraged client to attend all scheduled client medical appointments in next 30 days. Client sees Dr .Soyla Murphy, endocrinologist, as scheduled. Client continues to communicate as needed with Reginia Naas RN regarding ongoing client nursing needs. Richarda Osmond has worked with client for nearly a year in providing [place] for client.  CSW encouraged client to call CSW at [phone] as needed to discuss social work needs of client.     Plan:  Client to attend all scheduled client medical appointments in next 30 days.  CSW to collaborate with RN Reginia Naas in monitoring needs of client.  CSW to call client in 4 weeks to assess client needs at that time.  [name].Ein Rijo MSW, LCSW Licensed Clinical Social Worker [facility] Care Management [phone]

## 2016-07-16 ENCOUNTER — Encounter: Payer: Self-pay | Admitting: *Deleted

## 2016-07-16 ENCOUNTER — Other Ambulatory Visit: Payer: Self-pay | Admitting: *Deleted

## 2016-07-16 NOTE — Patient Outreach (Addendum)
[name]) Care Management [name] Telephone Outreach  07/16/2016  Susan [name] September 04, 1950 [id number]  Successful telephone outreach to [name], 66 y.o.femalewell known to [name] for for self-management of chronic disease states of CHF and DM. Susan [name] has had 3 inpatient hospital visits since January 2017, but no hospital readmissions for the last 9-plusmonths. HIPAA/ identity verified during phone call today.  Today, Susan [name] reports that she is "doing better than the last time" I saw her in early February.  Patient was not feeling well during [name] home visit on  July 01, 2016 and states that her symptoms have resolved now and she only "has a few sniffles."  Continues to deny need to contact PCP, stating she is feeling better now.   -- self-health management of DM and CHF:    Attended recent visit with [name] provider, states "got a good report."  States she has continued monitoring and recording daily weights, which have been 379-382 pounds.  Denies issues with increased leg swelling/ erythema, states breathing "fine."  "Sugars still running high;" patient acknowledges that her blood sugar reading during recent [name] appointment "was over 600," and that she was contacted by provider to go to ED, which she did not do.  Stated that she has been checking her blood sugars "more regularly" and reports that they often register "high" on her glucometer.  Patient was advised to let her endocrinology provider know about her high sugars, and patient verbalized understanding and agreement.  Patient denies further issues, concerns, or problems today.  I confirmed that patient has my direct phone number, the main [name] CM office phone number, and the [name] CM 24-hour nurse advice phone number should issues arise prior to next scheduled [name] outreach, and we confirmed our next scheduled in-home visit for next month.   Plan:   Susan [name] will  continue to take all of her medications as they are prescribed and will keep all scheduled provider appointments.  Susan [name] will call EMS for any urgent or emergent issues or concerns she experiences.Susan [name] will contact her care/ medical providers to report concerns with her blood sugars running high or low, signs and symptoms of fluid overload in the setting of CHF, any falls or feelings of illness she experiences.   Susan [name] will continue to monitor and record her daily weight and blood sugars TID, and will take these values to ALLprovider appointments with her.  [name] outreach to continue with scheduled [name] routine home visit next month.   [name], RN, BSN, [employer] [facility] Care Management  [phone]

## 2016-07-28 ENCOUNTER — Emergency Department (HOSPITAL_COMMUNITY)
Admission: EM | Admit: 2016-07-28 | Discharge: 2016-07-28 | Disposition: A | Discharge status: Home / Self Care | Payer: PPO | Attending: Emergency Medicine | Admitting: Emergency Medicine

## 2016-07-28 ENCOUNTER — Encounter (HOSPITAL_COMMUNITY): Payer: Self-pay | Admitting: Emergency Medicine

## 2016-07-28 ENCOUNTER — Emergency Department (HOSPITAL_COMMUNITY): Payer: PPO

## 2016-07-28 DIAGNOSIS — Z87891 Personal history of nicotine dependence: Secondary | ICD-10-CM | POA: Diagnosis not present

## 2016-07-28 DIAGNOSIS — R739 Hyperglycemia, unspecified: Secondary | ICD-10-CM

## 2016-07-28 DIAGNOSIS — Z7982 Long term (current) use of aspirin: Secondary | ICD-10-CM | POA: Insufficient documentation

## 2016-07-28 DIAGNOSIS — I130 Hypertensive heart and chronic kidney disease with heart failure and stage 1 through stage 4 chronic kidney disease, or unspecified chronic kidney disease: Secondary | ICD-10-CM | POA: Insufficient documentation

## 2016-07-28 DIAGNOSIS — Z8673 Personal history of transient ischemic attack (TIA), and cerebral infarction without residual deficits: Secondary | ICD-10-CM | POA: Insufficient documentation

## 2016-07-28 DIAGNOSIS — R1111 Vomiting without nausea: Secondary | ICD-10-CM | POA: Diagnosis not present

## 2016-07-28 DIAGNOSIS — E1140 Type 2 diabetes mellitus with diabetic neuropathy, unspecified: Secondary | ICD-10-CM | POA: Insufficient documentation

## 2016-07-28 DIAGNOSIS — E860 Dehydration: Secondary | ICD-10-CM | POA: Diagnosis not present

## 2016-07-28 DIAGNOSIS — E1065 Type 1 diabetes mellitus with hyperglycemia: Secondary | ICD-10-CM | POA: Diagnosis not present

## 2016-07-28 DIAGNOSIS — E1165 Type 2 diabetes mellitus with hyperglycemia: Secondary | ICD-10-CM | POA: Insufficient documentation

## 2016-07-28 DIAGNOSIS — N183 Chronic kidney disease, stage 3 (moderate): Secondary | ICD-10-CM | POA: Diagnosis not present

## 2016-07-28 DIAGNOSIS — I2510 Atherosclerotic heart disease of native coronary artery without angina pectoris: Secondary | ICD-10-CM | POA: Insufficient documentation

## 2016-07-28 DIAGNOSIS — R05 Cough: Secondary | ICD-10-CM | POA: Diagnosis not present

## 2016-07-28 DIAGNOSIS — E1122 Type 2 diabetes mellitus with diabetic chronic kidney disease: Secondary | ICD-10-CM | POA: Diagnosis not present

## 2016-07-28 DIAGNOSIS — I5032 Chronic diastolic (congestive) heart failure: Secondary | ICD-10-CM | POA: Insufficient documentation

## 2016-07-28 DIAGNOSIS — R197 Diarrhea, unspecified: Secondary | ICD-10-CM | POA: Diagnosis not present

## 2016-07-28 DIAGNOSIS — I252 Old myocardial infarction: Secondary | ICD-10-CM | POA: Diagnosis not present

## 2016-07-28 DIAGNOSIS — Z794 Long term (current) use of insulin: Secondary | ICD-10-CM | POA: Diagnosis not present

## 2016-07-28 DIAGNOSIS — R112 Nausea with vomiting, unspecified: Secondary | ICD-10-CM | POA: Diagnosis not present

## 2016-07-28 DIAGNOSIS — I482 Chronic atrial fibrillation: Secondary | ICD-10-CM | POA: Diagnosis not present

## 2016-07-28 LAB — I-STAT CHEM 8, ED
BUN: 38 mg/dL — ABNORMAL HIGH (ref 6–20)
Calcium, Ion: 1.11 mmol/L — ABNORMAL LOW (ref 1.15–1.40)
Chloride: 100 mmol/L — ABNORMAL LOW (ref 101–111)
Creatinine, Ser: 1.5 mg/dL — ABNORMAL HIGH (ref 0.44–1.00)
Glucose, Bld: 398 mg/dL — ABNORMAL HIGH (ref 65–99)
HCT: 32 % — ABNORMAL LOW (ref 36.0–46.0)
Hemoglobin: 10.9 g/dL — ABNORMAL LOW (ref 12.0–15.0)
Potassium: 4.5 mmol/L (ref 3.5–5.1)
Sodium: 136 mmol/L (ref 135–145)
TCO2: 25 mmol/L (ref 0–100)

## 2016-07-28 LAB — URINALYSIS, ROUTINE W REFLEX MICROSCOPIC
Bilirubin Urine: NEGATIVE
Glucose, UA: >=500 mg/dL — AB
Ketones, ur: 5 mg/dL — AB
Leukocytes, UA: NEGATIVE
Nitrite: NEGATIVE
Protein, ur: 100 mg/dL — AB
Specific Gravity, Urine: 1.022 (ref 1.005–1.030)
pH: 5 (ref 5.0–8.0)

## 2016-07-28 LAB — HEPATIC FUNCTION PANEL
ALT: 17 U/L (ref 14–54)
AST: 17 U/L (ref 15–41)
Albumin: 3 g/dL — ABNORMAL LOW (ref 3.5–5.0)
Alkaline Phosphatase: 138 U/L — ABNORMAL HIGH (ref 38–126)
Bilirubin, Direct: 0.1 mg/dL (ref 0.1–0.5)
Indirect Bilirubin: 1 mg/dL — ABNORMAL HIGH (ref 0.3–0.9)
Total Bilirubin: 1.1 mg/dL (ref 0.3–1.2)
Total Protein: 7.5 g/dL (ref 6.5–8.1)

## 2016-07-28 LAB — I-STAT VENOUS BLOOD GAS, ED
Bicarbonate: 26.1 mmol/L (ref 20.0–28.0)
O2 Saturation: 56 %
TCO2: 28 mmol/L (ref 0–100)
pCO2, Ven: 48.5 mmHg (ref 44.0–60.0)
pH, Ven: 7.338 (ref 7.250–7.430)
pO2, Ven: 32 mmHg (ref 32.0–45.0)

## 2016-07-28 LAB — CBG MONITORING, ED
Glucose-Capillary: >600 mg/dL (ref 65–99)
Glucose-Capillary: 545 mg/dL (ref 65–99)
Glucose-Capillary: 448 mg/dL — ABNORMAL HIGH (ref 65–99)
Glucose-Capillary: 389 mg/dL — ABNORMAL HIGH (ref 65–99)
Glucose-Capillary: 296 mg/dL — ABNORMAL HIGH (ref 65–99)
Glucose-Capillary: 238 mg/dL — ABNORMAL HIGH (ref 65–99)

## 2016-07-28 LAB — BASIC METABOLIC PANEL
Anion gap: 14 (ref 5–15)
BUN: 42 mg/dL — ABNORMAL HIGH (ref 6–20)
CO2: 25 mmol/L (ref 22–32)
Calcium: 9.1 mg/dL (ref 8.9–10.3)
Chloride: 92 mmol/L — ABNORMAL LOW (ref 101–111)
Creatinine, Ser: 1.82 mg/dL — ABNORMAL HIGH (ref 0.44–1.00)
GFR calc Af Amer: 32 mL/min — ABNORMAL LOW (ref >60)
GFR calc non Af Amer: 28 mL/min — ABNORMAL LOW (ref >60)
Glucose, Bld: 702 mg/dL (ref 65–99)
Potassium: 4.5 mmol/L (ref 3.5–5.1)
Sodium: 131 mmol/L — ABNORMAL LOW (ref 135–145)

## 2016-07-28 LAB — CBC
HCT: 37.4 % (ref 36.0–46.0)
Hemoglobin: 11.8 g/dL — ABNORMAL LOW (ref 12.0–15.0)
MCH: 25.9 pg — ABNORMAL LOW (ref 26.0–34.0)
MCHC: 31.6 g/dL (ref 30.0–36.0)
MCV: 82.2 fL (ref 78.0–100.0)
Platelets: 228 10*3/uL (ref 150–400)
RBC: 4.55 MIL/uL (ref 3.87–5.11)
RDW: 14.8 % (ref 11.5–15.5)
WBC: 6.7 10*3/uL (ref 4.0–10.5)

## 2016-07-28 LAB — LIPASE, BLOOD: Lipase: 29 U/L (ref 11–51)

## 2016-07-28 MED ORDER — SODIUM CHLORIDE 0.9 % IV BOLUS (SEPSIS)
1000.0000 mL | Freq: Once | INTRAVENOUS | Status: AC
  Administered 2016-07-28: 1000 mL via INTRAVENOUS

## 2016-07-28 MED ORDER — SODIUM CHLORIDE 0.9 % IV SOLN
INTRAVENOUS | Status: DC
  Administered 2016-07-28: 4.9 [IU]/h via INTRAVENOUS
  Filled 2016-07-28 (×2): qty 2.5

## 2016-07-28 MED ORDER — SODIUM CHLORIDE 0.9 % IV SOLN
8.0000 mg | Freq: Once | INTRAVENOUS | Status: AC
  Administered 2016-07-28: 8 mg via INTRAVENOUS
  Filled 2016-07-28: qty 4

## 2016-07-28 MED ORDER — POTASSIUM CHLORIDE CRYS ER 20 MEQ PO TBCR
40.0000 meq | EXTENDED_RELEASE_TABLET | Freq: Once | ORAL | Status: AC
  Administered 2016-07-28: 40 meq via ORAL
  Filled 2016-07-28: qty 2

## 2016-07-28 MED ORDER — ONDANSETRON HCL 4 MG/2ML IJ SOLN: 4.0000 mg | Freq: Once | INTRAMUSCULAR | Status: DC

## 2016-07-28 MED ORDER — ONDANSETRON 4 MG PO TBDP: 4.0000 mg | ORAL_TABLET | Freq: Three times a day (TID) | ORAL | 0 refills | Status: DC | PRN

## 2016-07-28 NOTE — ED Triage Notes (Signed)
Pt having high CBG for 3 days- N/V/diarrhea. Increased thirst, frequent urination. EMS reports CBG >500. EMS gave 278mL bolus NS and 4mg  zofran. BP 160/90. HR 80

## 2016-07-28 NOTE — ED Notes (Signed)
Abnormal VBG reported to Dr. Leonette Monarch

## 2016-07-28 NOTE — ED Provider Notes (Addendum)
Complains of vomiting and diarrhea onset 5 days ago. Has had 2 episodes of vomiting today and 3 episodes of diarrhea. Symptoms accompanied by nonproductive cough and subjective fever. No treatment prior to coming here. On exam alert nontoxic-appearing lungs clear auscultation heart regular rate and rhythm abdomen obese, nontender extremities without edema   Orlie Dakin, MD 07/28/16 1636 ED ECG REPORT   Date: 07/28/2016  Rate: 70  Rhythm: normal sinus rhythm  QRS Axis: left  Intervals: normal  ST/T Wave abnormalities: nonspecific T wave changes  Conduction Disutrbances:right bundle branch block  Narrative Interpretation:   Old EKG Reviewed: unchanged  I have personally reviewed the EKG tracing and agree with the computerized printout as noted.   Orlie Dakin, MD 07/29/16 [phone]

## 2016-07-28 NOTE — ED Provider Notes (Signed)
[facility] DEPT Provider Note   CSN: [id number] Arrival date & time: 07/28/16  1441     History   Chief Complaint Chief Complaint  Patient presents with  . Hyperglycemia    HPI Susan [name] is a 66 y.o. female.  HPI Pt having high CBG for 3 days- N/V/diarrhea. Increased thirst, frequent urination. EMS reports CBG >500. EMS gave 21mL bolus NS and 4mg  zofran. BP 160/90. HR 80  Patient states her symptoms started 3 days ago with nausea, vomiting, diarrhea. She's having approximately 3 bowel movements a day which are loose. No recent antibiotics, no recent travel. Patient states that she has no significant abdominal pain. No sick contacts. No rectal bleeding or hematemesis. No dark black stools. She has not been eating much so stopped taking her insulin with meals. Despite this, her insulin has been over 500 at home which concerned her and prompted her to call the EMS for transport to the emergency department. She has not been taking anything for the diarrhea or for the nausea or seen her primary care provider for this.  Patient does not know what medications she takes for her diabetes control. She is able to state that she takes a medication that starts with an aunt and a medication that starts with an R for her insulin. Review records show that patient takes Novolin N twice a day with meals, 50 units in the morning and 60 at night area and she also takes a "long acting" but I do not see records of this in her records. She is not sure if she is taking Lantus but states she takes 60U qhs.  Past Medical History:  Diagnosis Date  . Acute MI 1999; 2007  . Anemia    hx  . Anginal pain ([place])   . Anxiety   . Arthritis    "generalized" (03/15/2014)  . CAD (coronary artery disease)    MI in 2000 - MI  2007 - treated bare metal stent (no nuclear since then as 9/11)  . Carotid artery disease ([place])   . CHF (congestive heart failure) ([place])   . Chronic diastolic heart failure (HCC)    a)  ECHO (08/2013) EF 55-60% and RV function nl b) RHC (08/2013) RA 4, RV 30/5/7, PA 25/10 (16), PCWP 7, Fick CO/CI 6.3/2.7, PVR 1.5 WU, PA 61 and 66%  . Daily headache    "~ every other day; since I fell in June" (03/15/2014)  . Depression   . Dyslipidemia   . Exertional shortness of breath   . HTN (hypertension)   . Hypothyroidism   . Obesity   . Osteoarthritis   . Peripheral neuropathy ([place])   . PONV (postoperative nausea and vomiting)   . RBBB (right bundle branch block)    Old  . Renal insufficiency    DENIES  . Stroke [facility])    mini strokes  . Syncope    likely due to low blood sugar  . Tachycardia    Sinus tachycardia  . Type II diabetes mellitus ([place])   . Urinary incontinence   . Venous insufficiency     Patient Active Problem List   Diagnosis Date Noted  . Weakness generalized   . CKD (chronic kidney disease) stage 3, GFR 30-59 ml/min 10/24/2015  . Absolute anemia 10/03/2015  . Generalized anxiety disorder 10/03/2015  . Insomnia 10/03/2015  . Uncontrolled diabetes mellitus ([place]) 07/24/2015  . Gastroenteritis 06/07/2015  . Diabetes mellitus type II, uncontrolled ([place]) 06/07/2015  . Chronic diastolic CHF (  congestive heart failure) ([place]) 06/07/2015  . Non compliance with medical treatment 04/17/2014  . Hyperglycemia 03/30/2014  . DKA (diabetic ketoacidoses) ([place]) 03/30/2014  . Rotator cuff tear 03/14/2014  . Morbid obesity ([place]) 09/23/2013  . Snoring 08/11/2013  . Diabetes mellitus ([place]) 12/25/2012  . Generalized weakness 12/25/2012  . Anxiety and depression 12/25/2012  . Obesity   . Urinary incontinence   . Renal insufficiency   . Depression 11/12/2010  . RBBB (right bundle branch block)   . Tachycardia   . CAD (coronary artery disease)   . Hyperlipemia 01/22/2009  . Essential hypertension 01/22/2009  . Shortness of breath 01/22/2009    Past Surgical History:  Procedure Laterality Date  . ABDOMINAL HYSTERECTOMY  1980's  . CATARACT EXTRACTION, BILATERAL  Bilateral ?2013  . CORONARY ANGIOPLASTY WITH STENT PLACEMENT  1999; 2007   "1 + 1"  . KNEE ARTHROSCOPY Left 10/25/2006  . RIGHT HEART CATHETERIZATION N/A 09/22/2013   Procedure: RIGHT HEART CATH;  Surgeon: Jolaine Artist, MD;  Location: [facility] CATH LAB;  Service: Cardiovascular;  Laterality: N/A;  . SHOULDER ARTHROSCOPY W/ ROTATOR CUFF REPAIR Right 03/14/2014  . SHOULDER ARTHROSCOPY WITH OPEN ROTATOR CUFF REPAIR Right 03/14/2014   Procedure: RIGHT SHOULDER ARTHROSCOPY WITH BICEPS RELEASE, OPEN SUBSCAPULA REPAIR, OPEN SUPRASPINATUS REPAIR.;  Surgeon: Meredith Pel, MD;  Location: [facility];  Service: Orthopedics;  Laterality: Right;  . TUBAL LIGATION  1970's    OB History    No data available       Home Medications    Prior to Admission medications   Medication Sig Start Date End Date Taking? Authorizing Provider  albuterol (PROVENTIL HFA;VENTOLIN HFA) 108 (90 BASE) MCG/ACT inhaler Inhale 2 puffs into the lungs every 6 (six) hours as needed for wheezing or shortness of breath. Reported on 10/30/2015   Yes Historical Provider, MD  aspirin EC 81 MG tablet Take 81 mg by mouth daily.   Yes Historical Provider, MD  carvedilol (COREG) 25 MG tablet Take 1 tablet (25 mg total) by mouth 2 (two) times daily with a meal. 03/31/16  Yes Amy D Clegg, NP  clonazePAM (KLONOPIN) 1 MG tablet TAKE 1 TABLET BY MOUTH 3 TIMES A DAY 04/22/16  Yes [name], PA-C  clopidogrel (PLAVIX) 75 MG tablet TAKE 1 TABLET (75 MG TOTAL) BY MOUTH DAILY WITH BREAKFAST. 06/10/16  Yes Jolaine Artist, MD  ferrous sulfate 325 (65 FE) MG tablet Take 325 mg by mouth daily with breakfast. Reported on 08/21/2015   Yes Historical Provider, MD  insulin NPH Human (HUMULIN N,NOVOLIN N) 100 UNIT/ML injection Inject 60 Units into the skin every morning.   Yes Historical Provider, MD  insulin regular (NOVOLIN R,HUMULIN R) 250 units/2.59mL (100 units/mL) injection Inject 5-40 Units into the skin 3 (three) times daily before meals.   Yes  Historical Provider, MD  isosorbide dinitrate (ISORDIL) 10 MG tablet TAKE 1 TABLET BY MOUTH TWICE A DAY 06/13/16  Yes Burnell Blanks, MD  levothyroxine (SYNTHROID, LEVOTHROID) 50 MCG tablet Take 50 mcg by mouth daily before breakfast.   Yes Historical Provider, MD  losartan (COZAAR) 50 MG tablet TAKE 1 TABLET BY MOUTH DAILY 03/11/16  Yes [name], PA-C  metolazone (ZAROXOLYN) 2.5 MG tablet Take 2.5 mg by mouth once a week. Take 1 tablet every Wednesday   Yes Historical Provider, MD  nitroGLYCERIN (NITROSTAT) 0.4 MG SL tablet Place 1 tablet (0.4 mg total) under the tongue every 5 (five) minutes as needed for chest pain. 05/08/15  Yes Amy D Clegg, NP  PARoxetine (PAXIL-CR) 25 MG 24 hr tablet Take 25 mg by mouth daily. Take with 25 mg tablet daily (total 62.5 mg)   Yes Historical Provider, MD  PARoxetine (PAXIL-CR) 37.5 MG 24 hr tablet Take 37.5 mg by mouth daily. Take with 25 mg tablet daily (total 62.5 mg)   Yes Historical Provider, MD  pregabalin (LYRICA) 150 MG capsule Take 150 mg by mouth 3 (three) times daily. Reported on 10/31/2015   Yes Historical Provider, MD  torsemide (DEMADEX) 20 MG tablet Take 4 tablets (80 mg total) by mouth 2 (two) times daily. 04/28/16  Yes Amy D Clegg, NP  ondansetron (ZOFRAN ODT) 4 MG disintegrating tablet Take 1 tablet (4 mg total) by mouth every 8 (eight) hours as needed for nausea or vomiting. 07/28/16   Karma Greaser, MD    Family History Family History  Problem Relation Age of Onset  . Heart attack Mother   . Coronary artery disease      Social History Social History  Substance Use Topics  . Smoking status: Former Smoker    Packs/day: 3.00    Years: 32.00    Types: Cigarettes    Quit date: 10/24/1997  . Smokeless tobacco: Never Used  . Alcohol use Yes     Comment: "might have 2-3 daiquiris in the summer"     Allergies   Codeine   Review of Systems Review of Systems  Constitutional: Negative for fever.  Gastrointestinal: Positive  for diarrhea, nausea and vomiting. Negative for abdominal distention, abdominal pain, anal bleeding, blood in stool and constipation.  Allergic/Immunologic: Negative for immunocompromised state.  All other systems reviewed and are negative.    Physical Exam Updated Vital Signs BP 138/87 (BP Location: Left Arm)   Pulse 73   Temp 97.8 F (36.6 C) (Oral)   Resp 11   SpO2 96%   Physical Exam  Constitutional: She is oriented to person, place, and time. She appears well-developed and well-nourished. No distress.  HENT:  Head: Normocephalic and atraumatic.  Dry mucous membranes  Eyes: Conjunctivae are normal. Right eye exhibits no discharge. Left eye exhibits no discharge.  Neck: Normal range of motion. Neck supple.  Cardiovascular: Normal rate and regular rhythm.   Pulmonary/Chest: Effort normal and breath sounds normal. No respiratory distress.  Abdominal: Soft. Bowel sounds are normal. She exhibits no distension and no mass. There is no tenderness. There is no rebound and no guarding. No hernia.  Neg murphys sign  Musculoskeletal: She exhibits no edema.  Neurological: She is alert and oriented to person, place, and time.  Skin: Skin is warm. No rash noted. She is not diaphoretic.  Psychiatric: She has a normal mood and affect.  Nursing note and vitals reviewed.  ED Treatments / Results  Labs (all labs ordered are listed, but only abnormal results are displayed) Labs Reviewed  BASIC METABOLIC PANEL - Abnormal; Notable for the following:       Result Value   Sodium 131 (*)    Chloride 92 (*)    Glucose, Bld 702 (*)    BUN 42 (*)    Creatinine, Ser 1.82 (*)    GFR calc non Af Amer 28 (*)    GFR calc Af Amer 32 (*)    All other components within normal limits  CBC - Abnormal; Notable for the following:    Hemoglobin 11.8 (*)    MCH 25.9 (*)    All other components within normal limits  URINALYSIS, ROUTINE W REFLEX MICROSCOPIC - Abnormal; Notable for the following:    Color,  Urine STRAW (*)    Glucose, UA >=500 (*)    Hgb urine dipstick MODERATE (*)    Ketones, ur 5 (*)    Protein, ur 100 (*)    Bacteria, UA RARE (*)    Squamous Epithelial / LPF 0-5 (*)    All other components within normal limits  HEPATIC FUNCTION PANEL - Abnormal; Notable for the following:    Albumin 3.0 (*)    Alkaline Phosphatase 138 (*)    Indirect Bilirubin 1.0 (*)    All other components within normal limits  CBG MONITORING, ED - Abnormal; Notable for the following:    Glucose-Capillary >600 (*)    All other components within normal limits  CBG MONITORING, ED - Abnormal; Notable for the following:    Glucose-Capillary 545 (*)    All other components within normal limits  CBG MONITORING, ED - Abnormal; Notable for the following:    Glucose-Capillary 448 (*)    All other components within normal limits  CBG MONITORING, ED - Abnormal; Notable for the following:    Glucose-Capillary 389 (*)    All other components within normal limits  CBG MONITORING, ED - Abnormal; Notable for the following:    Glucose-Capillary 296 (*)    All other components within normal limits  I-STAT CHEM 8, ED - Abnormal; Notable for the following:    Chloride 100 (*)    BUN 38 (*)    Creatinine, Ser 1.50 (*)    Glucose, Bld 398 (*)    Calcium, Ion 1.11 (*)    Hemoglobin 10.9 (*)    HCT 32.0 (*)    All other components within normal limits  CBG MONITORING, ED - Abnormal; Notable for the following:    Glucose-Capillary 238 (*)    All other components within normal limits  LIPASE, BLOOD  I-STAT VENOUS BLOOD GAS, ED  CBG MONITORING, ED  CBG MONITORING, ED  CBG MONITORING, ED  CBG MONITORING, ED  CBG MONITORING, ED  CBG MONITORING, ED    EKG  EKG Interpretation None       Radiology Dg Chest 2 View  Result Date: 07/28/2016 CLINICAL DATA:  Dry cough for 5 days EXAM: CHEST  2 VIEW COMPARISON:  10/26/2015 FINDINGS: Cardiac shadow is within normal limits. Lungs are well aerated bilaterally. No  focal infiltrate or sizable effusion is seen. No bony abnormality is noted. IMPRESSION: No active cardiopulmonary disease. Electronically Signed   By: Inez Catalina M.D.   On: 07/28/2016 16:50    Procedures Procedures (including critical care time)  Medications Ordered in ED Medications  sodium chloride 0.9 % bolus 1,000 mL (0 mLs Intravenous Stopped 07/28/16 1800)  ondansetron (ZOFRAN) 8 mg in sodium chloride 0.9 % 50 mL IVPB (8 mg Intravenous Given 07/28/16 1615)  sodium chloride 0.9 % bolus 1,000 mL (0 mLs Intravenous Stopped 07/28/16 1803)  potassium chloride SA (K-DUR,KLOR-CON) CR tablet 40 mEq (40 mEq Oral Given 07/28/16 1659)     Initial Impression / Assessment and Plan / ED Course  I have reviewed the triage vital signs and the nursing notes.  Pertinent labs & imaging results that were available during my care of the patient were reviewed by me and considered in my medical decision making (see chart for details).     Patient likely has a viral enteritis without evidence of sepsis, significant dehydration, AKI Abdomen is benign, without significant abdominal emergency  However, patient has discontinued her insulin during her illness and is very hyperglycemic without evidence of DKA or hyperosmolar coma Patient hydrated aggressively and started on insulin drip with improvement in her insulin to the 200s She was monitored and remained with glucose in the 200s, tolerated an entire meal by mouth and did not have any episodes of vomiting after Zofran in the emergency department Advised patient that she must return to her insulin regimen and prescribed her with some Zofran She'll follow-up with her primary care provider as an outpatient Circumflex return precautions including inability to control her sugars with extremes in numbers, passing out, fevers with severe sx, associated shortness of breath or chest pain or other concerning symptoms Patient starting a condition in agreement with  plan  Final Clinical Impressions(s) / ED Diagnoses   Final diagnoses:  Hyperglycemia    New Prescriptions Discharge Medication List as of 07/28/2016 10:25 PM    START taking these medications   Details  ondansetron (ZOFRAN ODT) 4 MG disintegrating tablet Take 1 tablet (4 mg total) by mouth every 8 (eight) hours as needed for nausea or vomiting., Starting Mon 07/28/2016, Print         Karma Greaser, MD 07/29/16 0120    Orlie Dakin, MD 07/31/16 1011

## 2016-07-28 NOTE — ED Notes (Signed)
cbg was 545

## 2016-08-01 ENCOUNTER — Encounter: Payer: Self-pay | Admitting: *Deleted

## 2016-08-01 ENCOUNTER — Other Ambulatory Visit: Payer: Self-pay | Admitting: *Deleted

## 2016-08-01 NOTE — Patient Outreach (Signed)
[facility]) Care Management [place] Telephone Outreach  08/01/2016  Susan [name] [birth year] [id number]  Successful telephone outreach to Susan [name], Susan [name] y.o.femalewell known to [place] for for self-management of chronic disease states of CHF and DM. Susan [name] has had 3 inpatient hospital visits since January 2017, but no hospital readmissions for the last 9-plusmonths. HIPAA/ identity verified during phone call today.  Call was placed today in follow up to patient's ED visit Monday 07/28/16 for hyperglycemia, N/V/D.  Patient was noted to have blood glucose > 500 during ED visit and was treated with IV fluids, IV insulin, K+, and antiemetics, with instructions to follow up with her PCP.  Today, Susan [name] reports that she is "not doing god at all," stating that she has continued feeling "sick on stomach and having nausea, occasional vomiting and diarrhea."  Patient does not sound to be in obvious distress during 20 minute phone call today; but endorses several times that she does not "feel well at all."  -- states monitoring/ recording blood sugars, "have stayed in 160-210 range;" however, patient reports that she has not taken her short-acting insulin because she "can't eat, can't keep anything down."  Reports taking long-acting HS insulin.  -- states NOT TAKING regularly prescribed medications, including diuretics, again stating that she "is not able to keep anything down/ can't eat."  States that she "is trying" to stay hydrated, and is able to drink fluids; states drinking low sugar gatorade and water.  -- states has not been monitoring/recording daily weights, because she "feels so bad" that she can't be out of bed for long enough to do so.  Denies increased LE swelling, states ankles "slightly" swollen.  Reports slight increase in SOB when she gets OOB to use bathroom.  Discussed overall ED visit interventions and ED discharge instructions with patient,  including that she follow up promptly with PCP.  Patient acknowledged that her last PCP visit was 04/07/16.  Discussed need for patient to continue/ re-start daily weights, taking medications as prescribed, and need to call EMS for urgent/ emergent issues, and to continue frequent monitoring of her blood sugar following sick day rules.  I strongly encouraged patient to contact her medical providers today to schedule post-ED visit appointment; patient verbalized understanding and agreement with this plan, but based on patient's past history, I am not confident that she will do so.  Discussed with patient that I would send notes from our telephone conversation to her providers as an [place].  Patient denies further issues, concerns, or problems today. I confirmed that patient hasmy direct phone number, the main [name] CM office phone number, and the [facility] CM 24-hour nurse advice phone number should issues arise prior to next scheduled [place] outreach, and we re-scheduled our next scheduled in-home visit for later this month, based on scheduling conflict with previously scheduled visit.  Plan:   Susan [name] will contact her medical providers today to inform them of her recent ED visit and schedule follow up appointment since she is still feeling poorly.  Susan [name] will begin taking all of her medications as they are prescribed and will keep all scheduled provider appointments.  Susan [name] will call EMS for any urgent or emergent issues or concerns she experiences.Susan [name] will contact her care/ medical providers to report concerns with her blood sugars running high or low, signs and symptoms of fluid overload in the setting of CHF, any falls or feelings of illnessshe experiences.   Susan [name] will re-start monitoring and recording  her daily weight and blood sugars TID, and will take these values to ALLprovider appointments with her.  [place] outreach to continue with scheduled [place] routine  home later this month.  [name], RN, BSN, [employer] [facility] Care Management  [phone]

## 2016-08-05 ENCOUNTER — Ambulatory Visit: Payer: Self-pay | Admitting: *Deleted

## 2016-08-06 ENCOUNTER — Encounter: Payer: Self-pay | Admitting: Physician Assistant

## 2016-08-06 ENCOUNTER — Ambulatory Visit (INDEPENDENT_AMBULATORY_CARE_PROVIDER_SITE_OTHER): Payer: PPO | Admitting: Physician Assistant

## 2016-08-06 VITALS — BP 140/60 | HR 60 | Temp 97.6°F | Resp 18 | Wt 277.8 lb

## 2016-08-06 DIAGNOSIS — K529 Noninfective gastroenteritis and colitis, unspecified: Secondary | ICD-10-CM

## 2016-08-06 DIAGNOSIS — E0810 Diabetes mellitus due to underlying condition with ketoacidosis without coma: Secondary | ICD-10-CM | POA: Diagnosis not present

## 2016-08-06 NOTE — Progress Notes (Signed)
Patient ID: Susan [name] MRN: [id number], DOB: February 09, 1951, 66 y.o. Date of Encounter: 08/06/2016, 4:38 PM    Chief Complaint:  Chief Complaint  Patient presents with  . chest congestion  . leg cramps  . hospital f/u  . Sore Throat     HPI: 66 y.o. year old female here for above.   I reviewed her ER note from 07/28/16. At that time she reported vomiting and diarrhea onset 5 days prior. 2 episodes of vomiting that day and 3 episodes of diarrhea that day. Also reported nonproductive cough and subjective fever. She also had been having high blood sugar readings increased thirst and frequent urination. Since she was not eating much, she had stopped her insulin. Her blood sugar had been over 500 at home so she called EMS.  In the ER labs were performed. Significant for sodium 131, glucose 702, urinalysis with glucose greater than 500.  Chest x-ray showed no active disease.  She was treated with IV fluid, IV Insulin, and Zofran and K Dur 40 mEq.  Today she reports that she has had no further vomiting or diarrhea. States that she is back on her insulin. States that she has been having cramps in her feet that go up her legs and the next day her legs feel sore.       Home Meds:   Outpatient Medications Prior to Visit  Medication Sig Dispense Refill  . albuterol (PROVENTIL HFA;VENTOLIN HFA) 108 (90 BASE) MCG/ACT inhaler Inhale 2 puffs into the lungs every 6 (six) hours as needed for wheezing or shortness of breath. Reported on 10/30/2015    . aspirin EC 81 MG tablet Take 81 mg by mouth daily.    . carvedilol (COREG) 25 MG tablet Take 1 tablet (25 mg total) by mouth 2 (two) times daily with a meal. 60 tablet 6  . clonazePAM (KLONOPIN) 1 MG tablet TAKE 1 TABLET BY MOUTH 3 TIMES A DAY 90 tablet 2  . clopidogrel (PLAVIX) 75 MG tablet TAKE 1 TABLET (75 MG TOTAL) BY MOUTH DAILY WITH BREAKFAST. 90 tablet 2  . ferrous sulfate 325 (65 FE) MG tablet Take 325 mg by mouth daily with breakfast.  Reported on 08/21/2015    . insulin NPH Human (HUMULIN N,NOVOLIN N) 100 UNIT/ML injection Inject 60 Units into the skin every morning.    . insulin regular (NOVOLIN R,HUMULIN R) 250 units/2.57mL (100 units/mL) injection Inject 5-40 Units into the skin 3 (three) times daily before meals.    . isosorbide dinitrate (ISORDIL) 10 MG tablet TAKE 1 TABLET BY MOUTH TWICE A DAY 60 tablet 6  . levothyroxine (SYNTHROID, LEVOTHROID) 50 MCG tablet Take 50 mcg by mouth daily before breakfast.    . losartan (COZAAR) 50 MG tablet TAKE 1 TABLET BY MOUTH DAILY 90 tablet 0  . metolazone (ZAROXOLYN) 2.5 MG tablet Take 2.5 mg by mouth once a week. Take 1 tablet every Wednesday    . nitroGLYCERIN (NITROSTAT) 0.4 MG SL tablet Place 1 tablet (0.4 mg total) under the tongue every 5 (five) minutes as needed for chest pain. 25 tablet 3  . ondansetron (ZOFRAN ODT) 4 MG disintegrating tablet Take 1 tablet (4 mg total) by mouth every 8 (eight) hours as needed for nausea or vomiting. 20 tablet 0  . PARoxetine (PAXIL-CR) 25 MG 24 hr tablet Take 25 mg by mouth daily. Take with 25 mg tablet daily (total 62.5 mg)    . PARoxetine (PAXIL-CR) 37.5 MG 24 hr tablet Take 37.5 mg  by mouth daily. Take with 25 mg tablet daily (total 62.5 mg)    . pregabalin (LYRICA) 150 MG capsule Take 150 mg by mouth 3 (three) times daily. Reported on 10/31/2015    . torsemide (DEMADEX) 20 MG tablet Take 4 tablets (80 mg total) by mouth 2 (two) times daily. 240 tablet 3   No facility-administered medications prior to visit.     Allergies:  Allergies  Allergen Reactions  . Codeine Nausea And Vomiting      Review of Systems: See HPI for pertinent ROS. All other ROS negative.    Physical Exam: Blood pressure 140/60, pulse 60, temperature 97.6 F (36.4 C), temperature source Oral, resp. rate 18, weight 277 lb 12.8 oz (126 kg)., Body mass index is 44.84 kg/m. General:  Obese WF. Hair unbrushed. Appears in no acute distress. HEENT: Normocephalic,  atraumatic, eyes without discharge, sclera non-icteric, nares are without discharge. Bilateral auditory canals clear, TM's are without perforation, pearly grey and translucent with reflective cone of light bilaterally. Oral cavity moist, posterior pharynx without exudate, erythema, peritonsillar abscess.  Neck: Supple. No thyromegaly. No lymphadenopathy. Lungs: Clear bilaterally to auscultation without wheezes, rales, or rhonchi. Breathing is unlabored. Heart: Regular rhythm. No murmurs, rubs, or gallops. Msk:  Strength and tone normal for age. Extremities/Skin: 1+ pedal edema but no pitting edema above this level.  Neuro: Alert and oriented X 3. Moves all extremities spontaneously. Gait is normal. CNII-XII grossly in tact. Psych:  Responds to questions appropriately with a normal affect.     ASSESSMENT AND PLAN:  66 y.o. year old female with  1. Gastroenteritis - BASIC METABOLIC PANEL WITH GFR  2. Diabetic ketoacidosis without coma associated with diabetes mellitus due to underlying condition ([place]) - BASIC METABOLIC PANEL WITH GFR  Will obtain f/u BMET to reassess electrolytes.  At ED 07/28/2016--CBC, LFTs were stable so will not repeat these.  She sees Endo--Dr. Bubba Camp for her diabetes so will f/u with her for that.    [address] [place], [place], [facility] 08/06/2016 4:38 PM

## 2016-08-07 ENCOUNTER — Other Ambulatory Visit (HOSPITAL_COMMUNITY): Payer: Self-pay | Admitting: Adult Health

## 2016-08-07 ENCOUNTER — Telehealth: Payer: Self-pay | Admitting: Physician Assistant

## 2016-08-07 LAB — BASIC METABOLIC PANEL WITH GFR
BUN: 51 mg/dL — ABNORMAL HIGH (ref 7–25)
CO2: 25 mmol/L (ref 20–31)
Calcium: 8.8 mg/dL (ref 8.6–10.4)
Chloride: 94 mmol/L — ABNORMAL LOW (ref 98–110)
Creat: 2.12 mg/dL — ABNORMAL HIGH (ref 0.50–0.99)
GFR, Est African American: 27 mL/min — ABNORMAL LOW (ref >=60)
GFR, Est Non African American: 24 mL/min — ABNORMAL LOW (ref >=60)
Glucose, Bld: 404 mg/dL (ref 70–99)
Potassium: 4.5 mmol/L (ref 3.5–5.3)
Sodium: 129 mmol/L — ABNORMAL LOW (ref 135–146)

## 2016-08-07 NOTE — Telephone Encounter (Signed)
Patient would like to speak to you would not tell my why and only wants to speak to you [phone]

## 2016-08-13 ENCOUNTER — Other Ambulatory Visit: Payer: Self-pay | Admitting: *Deleted

## 2016-08-13 ENCOUNTER — Encounter: Payer: Self-pay | Admitting: *Deleted
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
## 2016-08-14 ENCOUNTER — Encounter: Payer: Self-pay | Admitting: Licensed Clinical Social Worker

## 2016-08-14 ENCOUNTER — Other Ambulatory Visit: Payer: Self-pay | Admitting: Licensed Clinical Social Worker

## 2016-08-14 NOTE — Patient Outreach (Signed)
Assessment:  CSW spoke via phone with client. CSW verified client identity. Client sees Susan [name], Actor, as primary care provider.  Client has prescribed medications and is taking medications as prescribed.  Client drives herself to scheduled medical appointments. Client has [name] support with RN Reginia Naas. Reginia Naas RN has actively been providing [place] support and education for client. Client has congestive heart failure. She has complained of recurrent chest pain. She has talked with her primary medical provider about chest pain issues and strategies in managing her chest pain issues. She is aware of 911 as a resources for emergency care for client. Reginia Naas has educated client as well about use of 911 for emergency client support.   [facility] [place], [place]. Client uses a walker or cane to help in ambulation. She has [employer]. CSW communicated with client on 08/14/16 that client has met her care plan goals with CSW services. CSW informed client that CSW is discharging client from [facility] on 08/14/16 since client has met her care plan goals with CSW services. CSW encouraged client to continue to attend appointments with medical provider and to attend [facility] in [place], [place].    Plan:  CSW is discharging client from [facility] on 08/14/16 since client has now met her care plan goals with CSW services.  CSW will inform Allena Earing that [facility] discharged client on 08/14/16 from [place] services.  CSW to fax letter to Susan [name], Physician Assistant Certified to inform Susan [name] that [facility] discharged client from [place] services only on 08/14/16.  [name].Benicio Manna MSW, LCSW Licensed Clinical Social Worker [facility] Care Management [phone]

## 2016-08-19 ENCOUNTER — Ambulatory Visit (HOSPITAL_COMMUNITY)
Admission: RE | Admit: 2016-08-19 | Discharge: 2016-08-19 | Disposition: A | Discharge status: Home / Self Care | Payer: PPO | Source: Ambulatory Visit | Attending: Internal Medicine | Admitting: Internal Medicine

## 2016-08-19 VITALS — BP 162/58 | HR 81 | Wt 281.6 lb

## 2016-08-19 DIAGNOSIS — I2510 Atherosclerotic heart disease of native coronary artery without angina pectoris: Secondary | ICD-10-CM | POA: Diagnosis not present

## 2016-08-19 DIAGNOSIS — Z8673 Personal history of transient ischemic attack (TIA), and cerebral infarction without residual deficits: Secondary | ICD-10-CM | POA: Diagnosis not present

## 2016-08-19 DIAGNOSIS — E1165 Type 2 diabetes mellitus with hyperglycemia: Secondary | ICD-10-CM | POA: Diagnosis not present

## 2016-08-19 DIAGNOSIS — N182 Chronic kidney disease, stage 2 (mild): Secondary | ICD-10-CM

## 2016-08-19 DIAGNOSIS — Z79899 Other long term (current) drug therapy: Secondary | ICD-10-CM | POA: Diagnosis not present

## 2016-08-19 DIAGNOSIS — Z9114 Patient's other noncompliance with medication regimen: Secondary | ICD-10-CM | POA: Insufficient documentation

## 2016-08-19 DIAGNOSIS — Z7982 Long term (current) use of aspirin: Secondary | ICD-10-CM | POA: Diagnosis not present

## 2016-08-19 DIAGNOSIS — N183 Chronic kidney disease, stage 3 unspecified: Secondary | ICD-10-CM

## 2016-08-19 DIAGNOSIS — Z09 Encounter for follow-up examination after completed treatment for conditions other than malignant neoplasm: Secondary | ICD-10-CM | POA: Diagnosis not present

## 2016-08-19 DIAGNOSIS — Z794 Long term (current) use of insulin: Secondary | ICD-10-CM | POA: Diagnosis not present

## 2016-08-19 DIAGNOSIS — I878 Other specified disorders of veins: Secondary | ICD-10-CM | POA: Insufficient documentation

## 2016-08-19 DIAGNOSIS — Z87891 Personal history of nicotine dependence: Secondary | ICD-10-CM | POA: Diagnosis not present

## 2016-08-19 DIAGNOSIS — I5032 Chronic diastolic (congestive) heart failure: Secondary | ICD-10-CM | POA: Diagnosis not present

## 2016-08-19 DIAGNOSIS — I130 Hypertensive heart and chronic kidney disease with heart failure and stage 1 through stage 4 chronic kidney disease, or unspecified chronic kidney disease: Secondary | ICD-10-CM | POA: Diagnosis not present

## 2016-08-19 DIAGNOSIS — IMO0002 Reserved for concepts with insufficient information to code with codable children: Secondary | ICD-10-CM

## 2016-08-19 DIAGNOSIS — E039 Hypothyroidism, unspecified: Secondary | ICD-10-CM | POA: Diagnosis not present

## 2016-08-19 DIAGNOSIS — E669 Obesity, unspecified: Secondary | ICD-10-CM | POA: Diagnosis not present

## 2016-08-19 DIAGNOSIS — E1122 Type 2 diabetes mellitus with diabetic chronic kidney disease: Secondary | ICD-10-CM | POA: Diagnosis not present

## 2016-08-19 NOTE — Progress Notes (Signed)
fPatient ID: Susan [name], female   DOB: December 25, 1950, 66 y.o.   MRN: [id number]   PCP: Dr Edilia Bo  Cardiologist: Dr Myrtis Ser  Endocrinologist: Dr Lurene Shadow  Primary HF Cardiologist: Dr. Gala Romney  [facility] Patient  HPI: Susan [name] is a 66 year old with a history of RBBB, DM, Hypothyroid, diastolic heart failure, CAD MI 2000/2007 BMS 2007, TIA, obesity.   Admitted 06/07/15 with N,V, hyperglycemia. Creatinine was up to 1.9. On the day discharge creatinine was back down to 1.2 Thought to be dehydrated so diuretics were held. Diuretics restarted on the day discharge. Discharge weight 282 pounds.   Admitted 2/13 through 07/13/15 with gastroenteritis and hyper glycemia. Diuretic initially held due to dehydration. Losartan and metolazone stopped with AKI/CKD. Discharge weight was 283 pounds.   Admitted June 1st through June 3rd, 2017 with altered mental status, hypothermia, and weakness. Diuretics initially held and later restarted. Unfortunately when she was discharged she refused home health.  Today she returns for HF follow up. Overall feeling ok. Says she just got over a viral illness and stopped her medications. She is back on her medications. SOB with exertion. Eating high salt foods. Drinking > 2 liters of fluids. Weight at home has been 272-279 pounds. Taking all medications. Lives with her husband. Followed by Susan [name].   ECHO 07/11/2015- 50-55%.  Grade I DD  RHC 09/22/13: RA = 4  RV = 30/5/7  PA = 25/10 (16)  PCW = 7  Fick cardiac output/index = 6.3/2.7  PVR = 1.5 WU   FA sat = 93%  PA sat = 61%, 66%   SH: Former smoker quit 15 year ago. Does drink alcohol. She is disabled. Lives at home with her husband and disabled son - Husband is truck Hospital doctor. Son has a brain injury after he attempted suicide  A few years ago. .    FH: Mom MI  ROS: All systems negative except as listed in HPI, PMH and Problem List.  Past Medical History:  Diagnosis Date  . Acute MI 1999; 2007  . Anemia    hx  . Anginal pain  (HCC)   . Anxiety   . Arthritis    "generalized" (03/15/2014)  . CAD (coronary artery disease)    MI in 2000 - MI  2007 - treated bare metal stent (no nuclear since then as 9/11)  . Carotid artery disease (HCC)   . CHF (congestive heart failure) (HCC)   . Chronic diastolic heart failure (HCC)    a) ECHO (08/2013) EF 55-60% and RV function nl b) RHC (08/2013) RA 4, RV 30/5/7, PA 25/10 (16), PCWP 7, Fick CO/CI 6.3/2.7, PVR 1.5 WU, PA 61 and 66%  . Daily headache    "~ every other day; since I fell in June" (03/15/2014)  . Depression   . Dyslipidemia   . Exertional shortness of breath   . HTN (hypertension)   . Hypothyroidism   . Obesity   . Osteoarthritis   . Peripheral neuropathy (HCC)   . PONV (postoperative nausea and vomiting)   . RBBB (right bundle branch block)    Old  . Renal insufficiency    DENIES  . Stroke [facility])    mini strokes  . Syncope    likely due to low blood sugar  . Tachycardia    Sinus tachycardia  . Type II diabetes mellitus (HCC)   . Urinary incontinence   . Venous insufficiency     Current Outpatient Prescriptions  Medication Sig Dispense Refill  .  albuterol (PROVENTIL HFA;VENTOLIN HFA) 108 (90 BASE) MCG/ACT inhaler Inhale 2 puffs into the lungs every 6 (six) hours as needed for wheezing or shortness of breath. Reported on 10/30/2015    . aspirin EC 81 MG tablet Take 81 mg by mouth daily.    . carvedilol (COREG) 12.5 MG tablet Take 12.5 mg by mouth 2 (two) times daily with a meal.    . clonazePAM (KLONOPIN) 1 MG tablet TAKE 1 TABLET BY MOUTH 3 TIMES A DAY 90 tablet 2  . clopidogrel (PLAVIX) 75 MG tablet TAKE 1 TABLET (75 MG TOTAL) BY MOUTH DAILY WITH BREAKFAST. 90 tablet 2  . ferrous sulfate 325 (65 FE) MG tablet Take 325 mg by mouth daily with breakfast. Reported on 08/21/2015    . insulin NPH Human (HUMULIN N,NOVOLIN N) 100 UNIT/ML injection Inject 60 Units into the skin every morning.    . insulin regular (NOVOLIN R,HUMULIN R) 250 units/2.4mL (100  units/mL) injection Inject 5-40 Units into the skin 3 (three) times daily before meals.    . isosorbide dinitrate (ISORDIL) 10 MG tablet TAKE 1 TABLET BY MOUTH TWICE A DAY 60 tablet 6  . levothyroxine (SYNTHROID, LEVOTHROID) 50 MCG tablet Take 50 mcg by mouth daily before breakfast.    . losartan (COZAAR) 50 MG tablet TAKE 1 TABLET BY MOUTH DAILY 90 tablet 0  . metolazone (ZAROXOLYN) 2.5 MG tablet Take 2.5 mg by mouth once a week. Take 1 tablet every Wednesday    . nitroGLYCERIN (NITROSTAT) 0.4 MG SL tablet Place 1 tablet (0.4 mg total) under the tongue every 5 (five) minutes as needed for chest pain. 25 tablet 3  . PARoxetine (PAXIL-CR) 25 MG 24 hr tablet Take 25 mg by mouth daily. Take with 25 mg tablet daily (total 62.5 mg)    . PARoxetine (PAXIL-CR) 37.5 MG 24 hr tablet Take 37.5 mg by mouth daily. Take with 25 mg tablet daily (total 62.5 mg)    . pregabalin (LYRICA) 150 MG capsule Take 150 mg by mouth 3 (three) times daily. Reported on 10/31/2015    . torsemide (DEMADEX) 20 MG tablet Take 4 tablets (80 mg total) by mouth 2 (two) times daily. 240 tablet 3   No current facility-administered medications for this encounter.     Vitals:   08/19/16 1411  BP: (!) 162/58  Pulse: 81  SpO2: 94%  Weight: 281 lb 9.6 oz (127.7 kg)   Wt Readings from Last 3 Encounters:  08/19/16 281 lb 9.6 oz (127.7 kg)  08/06/16 277 lb 12.8 oz (126 kg)  07/08/16 280 lb 8 oz (127.2 kg)    Chest circumference 134 cm   PHYSICAL EXAM: General:  NAD. Walked in the clinic.     HEENT: normal Neck: supple.  JVP 6-7   Carotids 2+ bilaterally; no bruits. No thyromegaly or nodule noted. Cor: PMI normal. RRR. No M/G/R Lungs: CTAB Abdomen: obese. NT, ND. No bruits or masses. +BS  Extremities: no cyanosis, clubbing, rash, RLE-LLEtrace edema. Skin warm  Neuro: A & O x3. Moves all 4 extremities w/o difficulty. Affect pleasant.   ASSESSMENT & PLAN: 1. Chronic Diastolic Heart Failure: NICM, EF 55-60% Grade II DD(06/2015 )   -NYHA III symptoms. Volume status stable.  Continue current diuretic regimen.  Stressed medication compliance..   Do the following things EVERYDAY: 1) Weigh yourself in the morning before breakfast. Write it down and keep it in a log. 2) Take your medicines as prescribed 3) Eat low salt foods-Limit salt (sodium) to 2000  mg per day.  4) Stay as active as you can everyday 5) Limit all fluids for the day to less than 2 liters 2.. Dyspnea-at her baseline.  3. Uncontrolled DM:   - Remains uncontrolled. Followed by Endocrinology. .   4. CAD - No CP. Continue ASA 81 mg daily. 5. Obesity: -Body mass index is 45.45 kg/m. Discussed portion control.    6. HTN Stable. No change.  7. CKD Stage III 8. Day time fatigue - Refuses sleep study.   9. Chronic Venous Stasis 10. H/O Hypoerkalemia- off spiro. Recent BMET on 3/5 ok.    She remains difficult to manage due to poor insight and ongoing medication noncompliance. Follow up in 6 weeks.      Tyanne Derocher NP-C   2:37 PM

## 2016-08-19 NOTE — Progress Notes (Signed)
Advanced Heart Failure Medication Review by a Pharmacist  Does the patient  feel that his/her medications are working for him/her?  yes  Has the patient been experiencing any side effects to the medications prescribed?  Yes - dizziness  Does the patient measure his/her own blood pressure or blood glucose at home?  no   Does the patient have any problems obtaining medications due to transportation or finances?   no  Understanding of regimen: good Understanding of indications: good Potential of compliance: good Patient understands to avoid NSAIDs. Patient understands to avoid decongestants.  Issues to address at subsequent visits: None   Pharmacist comments: Susan [name] is a pleasant 66 yo Caucasian female presenting to [facility]. She didn't bring her medications or a list to the visit today. She reports that last week, she took torsemide was 4 tabs in AM and 2 tabs in PM, but is back to 4 tablets in the AM and 4 in the PM now. There were 2 doses of carvedilol listed on her Epic medication list (12.5 mg bid and 25 mg bid), but she was unsure of which dose she was taking. I called her pharmacy and the last prescription of carvedilol she picked up was 12.5 mg tablets with instructions of 1.5 tabs bid, but she is taking 1 tab bid. She does have a RN that comes to her home once a month and sets up a pillbox for her, but she could benefit from more frequent visits. Will reach out to [facility] as she lives outside of [facility] and is therefore not qualified for our paramedicine services. She c/o nosebleeds and congestion, but is not taking any medications for this - recommended nasal saline spray. She is also c/o dizziness when changing positions - may need to check orthostasis. No other questions or concerns.    Belia Heman, PharmD PGY1 Pharmacy Resident [phone] (Pager) 08/19/2016 2:15 PM  Time with patient: 10 min Preparation and documentation time: 2 min Total time: 12 min

## 2016-08-19 NOTE — Patient Instructions (Signed)
Your physician recommends that you schedule a follow-up appointment in: 4-6 WEEKS WITH [name],NP   Do the following things EVERYDAY: 1) Weigh yourself in the morning before breakfast. Write it down and keep it in a log. 2) Take your medicines as prescribed 3) Eat low salt foods-Limit salt (sodium) to 2000 mg per day.  4) Stay as active as you can everyday 5) Limit all fluids for the day to less than 2 liters

## 2016-08-21 ENCOUNTER — Other Ambulatory Visit: Payer: Self-pay | Admitting: Pharmacist

## 2016-08-21 NOTE — Patient Outreach (Signed)
[place] [facility] Care Management  08/21/2016  Susan [name] [birth year] [id number]   Called patient regarding medication management and adherence per referral from [facility].  Female answered the phone and said patient was not at home. HIPAA compliant message was left.  Plan:  Attempt to call the patient back within two business days.   [name], PharmD, [place] Clinical Pharmacist [phone]

## 2016-08-22 ENCOUNTER — Other Ambulatory Visit: Payer: Self-pay | Admitting: Pharmacist

## 2016-08-22 NOTE — Patient Outreach (Signed)
[place] [facility]) Care Management  08/22/2016  Denijah Karrer 11/04/1950 [id number]   Patient was called regarding medication management. (Second Attempt) No answer. HIPAA compliant message left on voicemail.     Plan:  Call the patient again within 3-5 business days.  [name], PharmD, [place] Clinical Pharmacist [phone]

## 2016-08-25 ENCOUNTER — Other Ambulatory Visit: Payer: Self-pay | Admitting: Pharmacist

## 2016-08-25 ENCOUNTER — Encounter: Payer: Self-pay | Admitting: Pharmacist

## 2016-08-25 NOTE — Patient Outreach (Signed)
[place] [facility]) Care Management  08/25/2016  Niamya Vittitow 01/10/1951 [id number]   Patient was called regarding medication management (3rd attempt).  HIPAA compliant message left on patient's voicemail.   Plan:  1. Mail "unable to contact letter" to patient.  2. In basket [place] Nurse, Reginia Naas about my inability to contact the patient.  3.  Close pharmacy case if no contact from patient within 10 days.  [name], PharmD, [place] Clinical Pharmacist [phone]

## 2016-08-26 ENCOUNTER — Encounter: Payer: Self-pay | Admitting: *Deleted

## 2016-08-26 ENCOUNTER — Other Ambulatory Visit: Payer: Self-pay | Admitting: *Deleted
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
## 2016-08-28 ENCOUNTER — Encounter: Payer: Self-pay | Admitting: *Deleted

## 2016-08-28 ENCOUNTER — Other Ambulatory Visit: Payer: Self-pay | Admitting: *Deleted

## 2016-08-28 ENCOUNTER — Other Ambulatory Visit: Payer: Self-pay | Admitting: Physician Assistant

## 2016-08-28 ENCOUNTER — Ambulatory Visit: Payer: PPO | Admitting: Pharmacist

## 2016-08-28 ENCOUNTER — Encounter: Payer: Self-pay | Admitting: Pharmacist

## 2016-08-28 ENCOUNTER — Other Ambulatory Visit: Payer: Self-pay | Admitting: Pharmacist

## 2016-08-28 DIAGNOSIS — R0602 Shortness of breath: Secondary | ICD-10-CM

## 2016-08-28 NOTE — Telephone Encounter (Signed)
Refill appropriate 

## 2016-08-28 NOTE — Patient Outreach (Signed)
[facility]) Care Management  [place] Routine Home Visit  08/28/2016  Susan [name] 1950/10/23 [id number]   Susan [name] is a 66 y.o. female well known to [place] for for self-management of chronic disease states of CHF and DM. Susan [name] had 3 inpatient hospital visits in 2017, but no hospital readmissions for the last 9-plusmonths (since June 2017). HIPAA/ identity verified with patient in person today at her home, and [facility] Pharmacist Denyse Amass is also present to evaluate patient's management and understanding of her medications.  Patient's son Susan [name] is also present in the home but does not actively participate in visit.  Susan [name] is in no obvious distress throughout today's in-home visit.  Today, Susan [name] continues to report that she is "doing better," after her recent viral illness which required ED visit 07/28/16.  From an overall standpoint, Susan [name] looks the best she has since [place] became involved in her care over one year ago.  Susan [name] states she "is feeling the best" she has "in quite some time."  Susan [name] reports today that she has been experiencing increased urinary incontinence during sleep over last , stating  -- Medications:  Susan [name] reports that she has and is taking all of her medications.  Susan [name] is able to verbalize a general understanding of the scheduling/ dosing of her medications, but has ongoing confusion around the purpose of most of them.  Susan [name] had previously declined [place] services in May 2017, however, today, [facility] Pharmacist Denyse Amass thoroughly reviewed each of patient's medications during home visit today, and provided patient with written tools to guide filling of her pill box, which she performs independently each week, as [name] discovered several discrepancies in patient's prepared pill box.  Please see [place] note for additional details.  -- safety/ mobility/ falls:  Susan [name] previously reported new fall on Friday 08/08/16, when she  was attempting to help her sick dog, as the dog was in the process of actively dying.  Patient called EMS for assistance getting up after falling but did not go to ED and denies injury with fall. Has been using walker regularly, "more than usual" since her recent fall.  Patient is steady on her feet when ambulating around her home with/ without use of walker, however, her gait is guarded and slow.  Fall risks/ prevention education again discussed with patient and emotional support provided in the recent death of her pet.  Noted that patient now has grab bars installed in her bathroom shower/ toilet area, which has been a goal of patient's for almost a year now.  -- Provider appointments:  Attended PCP visit after her ED visit, on August 06, 2016, Heart Failure Clinic visit on August 19, 2016.  Denies questions or concerns post- provider visits.  All upcoming provider appointments reviewed with patient today.  Patient verbalizes plans to attend all scheduled provider appointments, stating she will drive herself to appointments. Upcoming appointments include:  Eye doctor for "retina check" on 09/16/16; [place] for routine visit on 09/17/16; Endocrinology appointment on 09/23/16.  Self-health management of chronic disease states of:   -- DM: last office visit withDr. [name] on May 27, 2016; has generally been following  new insulin guidelines from January 2018 visit, although during recent viral illness,[name] did not monitor/ record blood sugars or take insulin regularly, as she reported she just "felt too bad" and was unable to do.  Since recovering form her recent illness, patient verbalizes compliance with insulin administration and daily blood  sugar monitoring/ recording.  Reports no episodes of hypoglycemia, and is able to verbalize signs/ symptoms of hypoglycemia as well as action plan for hypoglycemia. Sick day rules were again thoroughly discussed with patient today.  Recorded blood sugar ranges since  August 01, 2016: 189-360 with averages noted to be mostly 220-320 Fasting this morning:  265  Last known Hgb A1-C: 11.9 per Dr. [name] Nov 2017  -- CHF:  has continued monitoring and recording daily weights; reports weight today of 283.7lbs (patient's scales at home).  Denies changes in breathing status, andstates that her legs continue being "much better."   Patient shows me one "open area" on her posterior (R) leg, mid way up shin, that she has covered with C/D bandage.  This area is approximately 1 cm in diameter and is not currently oozing, although it is open.  Bilateral LE have baseline swelling at +2-3 with (L) > (R).  No acute/ concerning erythema noted, with baseline chronic venous stasis bilaterally.  General weight range from patient's home scales reviewed with patient, since August 01, 2016, weight has ranged from 270- 294lbs. Susan [name] denies recent chest pain and tightness.   Patient denies further issues, concerns, or problems today. I confirmed that patient hasmy direct phone number, the main [facility] CM office phone number, and the [facility] CM 24-hour nurse advice phone number should issues arise prior to next scheduled [place] outreach by phone in 3 weeks.  Subjective: "I'm finally feeling better now that I am over that virus."  Objective:    BP 140/78   Pulse 84   Resp 18   Wt 283 lb 11.2 oz (128.7 kg)   SpO2 97%   BMI 45.79 kg/m    Review of Systems  Constitutional: Positive for malaise/fatigue.  HENT: Positive for congestion, sinus pain and sore throat.        Chronic sinus headache/ sore throat, "scratchy" feeling/ occasional nasal congestion  Respiratory: Positive for shortness of breath. Negative for cough, sputum production and wheezing.   Cardiovascular: Positive for leg swelling. Negative for chest pain.       Bilateral LE edema with +2 pitting edema (R) LE; +3 pitting edema (L) LE; skin at baseline with chronic venous stasis/ no acute erythema noted     Gastrointestinal: Negative for abdominal pain, diarrhea, nausea and vomiting.  Genitourinary: Positive for frequency and urgency.       Diuretic therapy   Musculoskeletal: Positive for myalgias. Negative for falls.       Occasional myalgias reported  Skin: Negative for rash.  Neurological: Positive for headaches. Negative for dizziness.  Psychiatric/Behavioral: Positive for depression. The patient is not nervous/anxious.        Patient reports depression is well controlled with medication     Physical Exam  Constitutional: She is oriented to person, place, and time. She appears well-developed and well-nourished. No distress.    Cardiovascular: Normal rate, regular rhythm, normal heart sounds and intact distal pulses.   Respiratory: Effort normal and breath sounds normal. No respiratory distress. She has no wheezes. She has no rales.  GI: Soft. Bowel sounds are normal.  Musculoskeletal: She exhibits edema.  See ROS  Neurological: She is alert and oriented to person, place, and time.  Skin: Skin is warm and dry. No rash noted. No erythema.  Psychiatric: She has a normal mood and affect. Her behavior is normal. Judgment and thought content normal.   Assessment:  Susan [name] continues to need reinforcement of self-health management of chronic  disease states of CHF and DM, as well as with compliance withinstructions provided to her regarding reasons to seek health care urgently/ emergently, and the complications that can result from non- compliance. Susan [name] has considered changing her current PCP and has decided against this for now.  Susan [name] does not have a strong support system within her family, and benefits from extrasupport and reinforcement from her healthcare providers. Susan [name] is committed to compliance with her medication regimen and to attending scheduled provider appointments, and can benefit from additional education/ management strategies around her medications.  From an overall standpoint,  Susan [name]  looks the best today  than she has since [place] CM began making home visits to her over a year ago.  Plan:   Susan [name] will continue to take all of her medications as they are prescribed and will keep all scheduled provider appointments.  Susan [name] will call EMS for any urgent or emergent issues or concerns she experiences.Susan [name] will contact her care/ medical providers to report concerns with her blood sugars running high or low, signs and symptoms of fluid overload in the setting of CHF, any falls or feelings of illness she experiences.   Susan [name] will continue to monitor and record her daily weight and blood sugars TID, and will take these values to ALLprovider appointments with her.  Susan [name] will review medication list provided to her today by [facility] Pharmacist to begin increasing her knowledge about the general purpose of her medications.  I will send today's notes from [facility] RN CM in-home visit to patient's providers as quarterly update.  [place] outreach to continue with scheduled [place] telephone outreach in 3 weeks and routine home visit next month.  [facility] CM Care Plan Problem One     Most Recent Value  Care Plan Problem One  Patient hesitation in contacting providers when she feels sick  Role Documenting the Problem One  Care Management Coordinator  Care Plan for Problem One  Not Active  [name] Long Term Goal (31-90 days)  Over the next 60 days, patient will verbalize reasons she should contact her providers urgently, as evidenced by patient reporting during [facility] RN CM outreach  [facility] Long Term Goal Start Date  07/01/16  [facility] Long Term Goal Met Date  08/28/16  Interventions for Problem One Long Term Goal  Utilizing teachback method, reiterated with patient reasons that she should contact her providers when she is not feeling well or having problems,  provided positive reinforcement for scheduling/ attending recent PCP visit when she was feeling sick  [name] CM Short  Term Goal #1 (0-30 days)  Over the next 30 days, patient will consider changing her PCP  [facility] CM Short Term Goal #1 Start Date  08/01/16  [facility] CM Short Term Goal #1 Met Date  08/28/16  Interventions for Short Term Goal #1  Utilizing teachback method, reiterated with patient that she should feel confident about contacting her providers when she is not feeling well,  provided positive reinforcement for scheduling and attending recent PCP visit    [facility] CM Care Plan Problem Two     Most Recent Value  Care Plan Problem Two  Patient confusion with medications and successful completion of pill box filling  Role Documenting the Problem Two  Care Management Coordinator  Care Plan for Problem Two  Active  Interventions for Problem Two Long Term Goal   Utilizing teachback method, participated in joint home visit with [facility] pharmacist and reiterated purpose of patient's medications,  provided support as [facility] pharmacist completed medication reconciliation  [name] Long Term Goal (31-90) days  Over the next 60 days., patient will verbalize purpose of 5 of her medications without prompting from [place] or [facility] pharmacist, as evidenced by patient reporting during [facility] RN CM outreach  [facility] Long Term Goal Start Date  08/28/16    [place] CM Care Plan Problem Three     Most Recent Value  Care Plan Problem Three  Ongoing need for reinforcement of self-health management of chronic disease state of DM and CHF  Role Documenting the Problem Three  Care Management Coordinator  Care Plan for Problem Three  Active  [name] Long Term Goal (31-90) days  Over the next 60 days, patient will verbalize plan for insulin use on sick days and signs/ symptoms CHF exacerbation, as evidenced by patient reporting during [facility] RN CM outreach   [place] Term Goal Start Date  08/28/16  Interventions for Problem Three Long Term Goal  Utilizing teachback method, discussed/ reinforced previously covered topics with patient on use of insulin on sick days and  signs/ symptoms CHF exacebation  [name] CM Short Term Goal #1 (0-30 days)  Within the next 30 days, patient will attend upcoming endocrinology provider appointment on Sep 23, 2016 and will be able to verbalize summary of visit, as evidenced by patient reporting during [facility] RN CM outreach  [facility] CM Short Term Goal #1 Start Date  08/28/16  Interventions for Short Term Goal #1  Utilizing teachback method, encouraged patient to take all recorded blood sugars with her to upcoming provider appointment and to write down any changes made during visit     [name], RN, BSN, [employer] Coordinator [facility] Care Management  [phone]

## 2016-08-28 NOTE — Patient Outreach (Signed)
[facility]) Care Management  [place]   08/28/2016  Yamari Ventola 1950/12/29 [id number]  Subjective: Home visit completed with patient at her home along with [place], Reginia Naas.  Patient is a 66 year old female with multiple medical conditions including:  Type 2 diabetes, hypothyroidism, heart failure NYHA class III CAD, history of MI, TIA, obesity and CKD stage 3. She visited the ED 07/28/2016 for hyperglycemia.   Patient reported managaing her medications by herself and uses her pill box. She did not know why she was taking any of her medications with the exception of torsemide and metolazone.      Objective:  B/P 140/78  Blood Sugar:  265 mg/dl Weight 283.7 pounds  HgA1c 11.9%  Encounter Medications: Outpatient Encounter Prescriptions as of 08/28/2016  Medication Sig Note  . albuterol (PROVENTIL HFA;VENTOLIN HFA) 108 (90 BASE) MCG/ACT inhaler Inhale 2 puffs into the lungs every 6 (six) hours as needed for wheezing or shortness of breath. Reported on 10/30/2015   . aspirin EC 81 MG tablet Take 81 mg by mouth daily.   . carvedilol (COREG) 12.5 MG tablet Take 12.5 mg by mouth 2 (two) times daily with a meal.   . clonazePAM (KLONOPIN) 1 MG tablet TAKE 1 TABLET BY MOUTH 3 TIMES A DAY   . clopidogrel (PLAVIX) 75 MG tablet TAKE 1 TABLET (75 MG TOTAL) BY MOUTH DAILY WITH BREAKFAST.   . ferrous sulfate 325 (65 FE) MG tablet Take 325 mg by mouth daily with breakfast. Reported on 08/21/2015   . insulin NPH Human (HUMULIN N,NOVOLIN N) 100 UNIT/ML injection Inject 60 Units into the skin every morning.   . insulin regular (NOVOLIN R,HUMULIN R) 250 units/2.27mL (100 units/mL) injection Inject 5-40 Units into the skin 3 (three) times daily before meals.   . isosorbide dinitrate (ISORDIL) 10 MG tablet TAKE 1 TABLET BY MOUTH TWICE A DAY   . levothyroxine (SYNTHROID, LEVOTHROID) 50 MCG tablet Take 50 mcg by mouth daily before breakfast.   . losartan (COZAAR) 50 MG  tablet TAKE 1 TABLET BY MOUTH DAILY   . metolazone (ZAROXOLYN) 2.5 MG tablet Take 2.5 mg by mouth once a week. Take 1 tablet every Wednesday 06/03/2016: Patient reports takes on Wednesday's or Thursday's depending on her schedule  . nitroGLYCERIN (NITROSTAT) 0.4 MG SL tablet Place 1 tablet (0.4 mg total) under the tongue every 5 (five) minutes as needed for chest pain. 08/19/2016: Used 1-2 times since last visit.  Marland Kitchen PARoxetine (PAXIL-CR) 25 MG 24 hr tablet Take 25 mg by mouth daily. Take with 25 mg tablet daily (total 62.5 mg)   . PARoxetine (PAXIL-CR) 37.5 MG 24 hr tablet Take 37.5 mg by mouth daily. Take with 25 mg tablet daily (total 62.5 mg)   . pregabalin (LYRICA) 150 MG capsule Take 150 mg by mouth 3 (three) times daily. Reported on 10/31/2015   . torsemide (DEMADEX) 20 MG tablet Take 4 tablets (80 mg total) by mouth 2 (two) times daily.    No facility-administered encounter medications on file as of 08/28/2016.     Functional Status: In your present state of health, do you have any difficulty performing the following activities: 02/26/2016 10/30/2015  Hearing? N N  Vision? N N  Difficulty concentrating or making decisions? Y N  Walking or climbing stairs? Y Y  Dressing or bathing? N Y  Doing errands, shopping? N N  Preparing Food and eating ? N N  Using the Toilet? N N  In  the past six months, have you accidently leaked urine? Y Y  Do you have problems with loss of bowel control? N N  Managing your Medications? N N  Managing your Finances? N N  Housekeeping or managing your Housekeeping? N N  Some recent data might be hidden    Fall/Depression Screening: PHQ 2/9 Scores 08/06/2016 12/18/2015 10/19/2015 08/09/2015 08/07/2015 03/16/2015  PHQ - 2 Score 6 4 4 4 4  0  PHQ- 9 Score 26 13 16 16 16  0    Assessment:  Patient's medications were reconciled by reviewing her actual medication bottles and via [facility].   Drugs sorted by  system:  Neurologic/Psychologic: Clonazepam Paroxetine (37.5mg  & 25mg  tablets)--PHQ9 score - 26 (08/06/16) Lyrica  Cardiovascular: Torsemide Aspirin Carvedilol Clopidogrel Isosorbide Dinitrate Nitroglycerin Metolazone Losartan   Pulmonary/Allergy: Proventil Respiclick   Endocrine: Humulin N Humulin R Levothyroxine  Vitamins/Minerals: Ferrous Sulfate    Medication Review Findings:  1.  Adherence- patient was missing several pills out of her pill box.  In addition, she had a few extra Carvedilol 12.5 mg in three days of the box that were a different manufacturer.(Medication box was checked and refilled for the patient.  In addition, a personalized medication list was created and provided for the patient based on the medication list in her chart organized by time of administration and with the indication for each medication listed.)    2.  Refills needed-  A.  Losartan (only had 2 tablets left)  B.  Nitroglycerin (had several open bottles that had been opened > 3 months)  C.  Novolin N-patient reported having an issue getting Novolin (Relion) filled at  [facility]. [facility] was called. They reported the previous Novolin  N prescription was too early because the instructions still listed 40 units in the  morning versus the increased dose of 60 units daily.  The pharmacy reported  having a new prescription with 60 units daily that will be ready for the patient to  pick up 08/29/16. ([facility]. Spoke with the pharmacist who  sent refill requests over to PCP.)    3.  Medication Dose discrepancy-Carvedilol-the medication label stated carvedilol 12.5mg  1 &1/2 tablet twice daily.  Patient reported only taking 1 tablet daily but she had at least 2 days in her pill box with one slot filled with 1&1/2 tablets. ( The medication list stated  1 tablet twice daily so that is what was put in the patient's pill box.)     Plan:  1.  Route note to PCP.  2.  Call  patient in 1 week.  3.  Conduct another home visit along with [place], Almira Bar in 6 weeks.  [name], PharmD, [place] Clinical Pharmacist [phone]

## 2016-09-03 ENCOUNTER — Other Ambulatory Visit: Payer: Self-pay | Admitting: Physician Assistant

## 2016-09-04 ENCOUNTER — Other Ambulatory Visit: Payer: Self-pay | Admitting: *Deleted

## 2016-09-04 ENCOUNTER — Other Ambulatory Visit: Payer: Self-pay | Admitting: Pharmacist

## 2016-09-04 ENCOUNTER — Other Ambulatory Visit: Payer: Self-pay | Admitting: Physician Assistant

## 2016-09-04 NOTE — Patient Outreach (Signed)
[place] [facility]) Care Management  09/04/2016  Susan [name] [birth year] [id number]   Received a phone call from [place], Almira Bar saying patient was having difficulty getting Losartan. The patient's pharmacy was called and asked to resend the request for Losartan as it was out of refills. The patient's PCP [facility] Doren Custard, [place]) was also called and a request was put in for them to respond to the [facility]ill [facility].  Review of the patient's chart show's a prescription for Losartan 50mg  1 tablet daily for a 90 day supply ordered on 09/03/16 but the pharmacy did not have an order.  Patient was called and she communicated understanding.  Plan:  Check in with the patient tomorrow afternoon to see if she has been able to get her prescription filled.  [name], PharmD, [place] Clinical Pharmacist [phone]

## 2016-09-04 NOTE — Patient Outreach (Signed)
[facility]) Care Management [place] Telephone Outreach  09/04/2016  Susan [name] 1950/11/27 [id number]  Received voicemail from patient, asking for call-back, immediately called patient back.  Successful telephone outreach to Susan [name],  67 y.o. female well known to [place] for for self-management of chronic disease states of CHF and DM. Susan [name] had 3 inpatient hospital visits in 2017, but no hospital readmissions for the last 9-plusmonths (since June 2017). HIPAA/ identity verified with patient during phone call today.   Today, Susan [name] reports that she is "doing fine," but states that she is concerned because she has been out of Losartin for "about 4 days now."  [place] verbalized that she remembered that Denyse Amass, [facility] pharmacist, had placed re-fill orders for NTg and Losartin during home visit last week.  Susan [name] stated that she successfully obtained NTG from pharmacy last week, but that Losartin was not available.  [facility]-fill request to PCP, and she was calling me today to inquire if there was anything I could do to assist in completing the re-fill for Losartin. Patient otherwise denies questions or concerns.  I then placed call to Denyse Amass, [facility] pharmacist, who performed medication review via EMR and confirmed that it appears that [name] re-fill was completed yesterday 09/03/16.  [name] agreed to reach out to patient by telephone to follow up.  Plan:  [name] outreach to continue with scheduled [name] telephone outreach in 2 weeks and routine home visit next month.  [name], RN, BSN, [facility] Care Management  [phone]

## 2016-09-05 ENCOUNTER — Other Ambulatory Visit: Payer: Self-pay | Admitting: Family Medicine

## 2016-09-05 ENCOUNTER — Other Ambulatory Visit: Payer: Self-pay | Admitting: Pharmacist

## 2016-09-05 ENCOUNTER — Ambulatory Visit: Payer: PPO | Admitting: Pharmacist

## 2016-09-05 ENCOUNTER — Telehealth: Payer: Self-pay | Admitting: Physician Assistant

## 2016-09-05 NOTE — Telephone Encounter (Signed)
rx was sent this morning

## 2016-09-05 NOTE — Patient Outreach (Addendum)
[facility]) Care Management  09/05/2016  Susan [name] 1950/12/13 [id number]   [place] and the patient's PCP office were called to follow up on refills on the patient's Losartan. [facility] not [facility] and I had to leave a message for the provider.  Plan:  Wait to hear from [place] office.  Call patient when resolved.  [name], PharmD, BCACP [facility] Clinical Pharmacist [phone]  ADDENDUM Sent an in-basket message to provider's office and recalled [facility]. Losartan is ready for pick up at the patient's pharmacy with a $10 copay.  Patient was called and a message was left on her voicemail.  [name], PharmD, [place] Clinical Pharmacist [phone]

## 2016-09-05 NOTE — Telephone Encounter (Signed)
Medication refilled per protocol. 

## 2016-09-05 NOTE — Telephone Encounter (Signed)
Susan [name] with pharmacy called and said that we were suppose to send over losartan and she can see in epic that its in there but they have not received it. Please call [phone].

## 2016-09-07 ENCOUNTER — Other Ambulatory Visit: Payer: Self-pay | Admitting: Internal Medicine

## 2016-09-07 ENCOUNTER — Other Ambulatory Visit (HOSPITAL_COMMUNITY): Payer: Self-pay | Admitting: Adult Health

## 2016-09-16 ENCOUNTER — Encounter (HOSPITAL_COMMUNITY): Payer: PPO

## 2016-09-16 DIAGNOSIS — E113413 Type 2 diabetes mellitus with severe nonproliferative diabetic retinopathy with macular edema, bilateral: Secondary | ICD-10-CM | POA: Diagnosis not present

## 2016-09-16 DIAGNOSIS — H43813 Vitreous degeneration, bilateral: Secondary | ICD-10-CM | POA: Diagnosis not present

## 2016-09-16 DIAGNOSIS — H3582 Retinal ischemia: Secondary | ICD-10-CM | POA: Diagnosis not present

## 2016-09-17 ENCOUNTER — Ambulatory Visit (HOSPITAL_COMMUNITY)
Admission: RE | Admit: 2016-09-17 | Discharge: 2016-09-17 | Disposition: A | Discharge status: Home / Self Care | Payer: PPO | Source: Ambulatory Visit | Attending: Internal Medicine | Admitting: Internal Medicine

## 2016-09-17 ENCOUNTER — Encounter (HOSPITAL_COMMUNITY): Payer: Self-pay

## 2016-09-17 VITALS — BP 150/108 | HR 93 | Wt 289.0 lb

## 2016-09-17 DIAGNOSIS — E669 Obesity, unspecified: Secondary | ICD-10-CM | POA: Insufficient documentation

## 2016-09-17 DIAGNOSIS — Z7982 Long term (current) use of aspirin: Secondary | ICD-10-CM | POA: Insufficient documentation

## 2016-09-17 DIAGNOSIS — I130 Hypertensive heart and chronic kidney disease with heart failure and stage 1 through stage 4 chronic kidney disease, or unspecified chronic kidney disease: Secondary | ICD-10-CM | POA: Insufficient documentation

## 2016-09-17 DIAGNOSIS — Z79899 Other long term (current) drug therapy: Secondary | ICD-10-CM | POA: Diagnosis not present

## 2016-09-17 DIAGNOSIS — I2510 Atherosclerotic heart disease of native coronary artery without angina pectoris: Secondary | ICD-10-CM | POA: Diagnosis not present

## 2016-09-17 DIAGNOSIS — Z6841 Body Mass Index (BMI) 40.0 and over, adult: Secondary | ICD-10-CM | POA: Insufficient documentation

## 2016-09-17 DIAGNOSIS — N183 Chronic kidney disease, stage 3 unspecified: Secondary | ICD-10-CM

## 2016-09-17 DIAGNOSIS — E1165 Type 2 diabetes mellitus with hyperglycemia: Secondary | ICD-10-CM | POA: Diagnosis not present

## 2016-09-17 DIAGNOSIS — E039 Hypothyroidism, unspecified: Secondary | ICD-10-CM | POA: Insufficient documentation

## 2016-09-17 DIAGNOSIS — IMO0002 Reserved for concepts with insufficient information to code with codable children: Secondary | ICD-10-CM

## 2016-09-17 DIAGNOSIS — Z87891 Personal history of nicotine dependence: Secondary | ICD-10-CM | POA: Diagnosis not present

## 2016-09-17 DIAGNOSIS — I252 Old myocardial infarction: Secondary | ICD-10-CM | POA: Insufficient documentation

## 2016-09-17 DIAGNOSIS — Z794 Long term (current) use of insulin: Secondary | ICD-10-CM

## 2016-09-17 DIAGNOSIS — I5032 Chronic diastolic (congestive) heart failure: Secondary | ICD-10-CM

## 2016-09-17 DIAGNOSIS — Z8673 Personal history of transient ischemic attack (TIA), and cerebral infarction without residual deficits: Secondary | ICD-10-CM | POA: Insufficient documentation

## 2016-09-17 DIAGNOSIS — E1122 Type 2 diabetes mellitus with diabetic chronic kidney disease: Secondary | ICD-10-CM | POA: Diagnosis not present

## 2016-09-17 DIAGNOSIS — N182 Chronic kidney disease, stage 2 (mild): Secondary | ICD-10-CM | POA: Diagnosis not present

## 2016-09-17 NOTE — Patient Instructions (Signed)
[place]!  Be sure to take an additional dose of Metolazone on Saturday, then resume normal dose of once weekly on Wednesdays.  Your physician recommends that you schedule a follow-up appointment in: 6 weeks with Darrick Grinder, NP  Do the following things EVERYDAY: 1) Weigh yourself in the morning before breakfast. Write it down and keep it in a log. 2) Take your medicines as prescribed 3) Eat low salt foods-Limit salt (sodium) to 2000 mg per day.  4) Stay as active as you can everyday 5) Limit all fluids for the day to less than 2 liters

## 2016-09-17 NOTE — Progress Notes (Signed)
fPatient ID: Susan [name], female   DOB: [birth year], 66 y.o.   MRN: [id number]   PCP: Dr Edilia Bo  Cardiologist: Dr Myrtis Ser  Endocrinologist: Dr Lurene Shadow  Primary HF Cardiologist: Dr. Gala Romney  [facility] Patient  HPI: Susan [name] is a 66 year old with a history of RBBB, DM, Hypothyroid, diastolic heart failure, CAD MI 2000/2007 BMS 2007, TIA, obesity. Admitted with hyperglycemia x 2 in 2017.   Admitted June 1st through June 3rd, 2017 with altered mental status, hypothermia, and weakness. Diuretics initially held and later restarted. Unfortunately when she was discharged she refused home health.  Today she returns for HF follow up. Feels terrible. SOB with exertion. + Orthopnea. Denies PND. Having ongoing difficulty with blood sugar. Weight at home gone up to 286 pounds from 279 pounds. Eating high salt food. Drinking > 2 liters per day and gatorade. Not able to move around much due to swelling. Says she is taking all medications.   ECHO 07/11/2015- 50-55%.  Grade I DD  RHC 09/22/13: RA = 4  RV = 30/5/7  PA = 25/10 (16)  PCW = 7  Fick cardiac output/index = 6.3/2.7  PVR = 1.5 WU   FA sat = 93%  PA sat = 61%, 66%   SH: Former smoker quit 15 year ago. Does drink alcohol. She is disabled. Lives at home with her husband and disabled son - Husband is truck Hospital doctor. Son has a brain injury after he attempted suicide  A few years ago. .    FH: Mom MI  ROS: All systems negative except as listed in HPI, PMH and Problem List.  Past Medical History:  Diagnosis Date  . Acute MI (HCC) 1999; 2007  . Anemia    hx  . Anginal pain (HCC)   . Anxiety   . Arthritis    "generalized" (03/15/2014)  . CAD (coronary artery disease)    MI in 2000 - MI  2007 - treated bare metal stent (no nuclear since then as 9/11)  . Carotid artery disease (HCC)   . CHF (congestive heart failure) (HCC)   . Chronic diastolic heart failure (HCC)    a) ECHO (08/2013) EF 55-60% and RV function nl b) RHC (08/2013) RA 4, RV 30/5/7, PA  25/10 (16), PCWP 7, Fick CO/CI 6.3/2.7, PVR 1.5 WU, PA 61 and 66%  . Daily headache    "~ every other day; since I fell in June" (03/15/2014)  . Depression   . Dyslipidemia   . Exertional shortness of breath   . HTN (hypertension)   . Hypothyroidism   . Obesity   . Osteoarthritis   . Peripheral neuropathy   . PONV (postoperative nausea and vomiting)   . RBBB (right bundle branch block)    Old  . Renal insufficiency    DENIES  . Stroke [facility])    mini strokes  . Syncope    likely due to low blood sugar  . Tachycardia    Sinus tachycardia  . Type II diabetes mellitus (HCC)   . Urinary incontinence   . Venous insufficiency     Current Outpatient Prescriptions  Medication Sig Dispense Refill  . albuterol (PROVENTIL HFA;VENTOLIN HFA) 108 (90 BASE) MCG/ACT inhaler Inhale 2 puffs into the lungs every 6 (six) hours as needed for wheezing or shortness of breath. Reported on 10/30/2015    . aspirin EC 81 MG tablet Take 81 mg by mouth daily.    . carvedilol (COREG) 12.5 MG tablet TAKE 1 AND 1/2 TABLETS  BY MOUTH TWICE A DAY WITH A MEAL 90 tablet 3  . clonazePAM (KLONOPIN) 1 MG tablet TAKE 1 TABLET BY MOUTH 3 TIMES A DAY 90 tablet 2  . clopidogrel (PLAVIX) 75 MG tablet TAKE 1 TABLET (75 MG TOTAL) BY MOUTH DAILY WITH BREAKFAST. 90 tablet 2  . ferrous sulfate 325 (65 FE) MG tablet Take 325 mg by mouth daily with breakfast. Reported on 08/21/2015    . insulin NPH Human (HUMULIN N,NOVOLIN N) 100 UNIT/ML injection Inject 60 Units into the skin every morning.    . insulin regular (NOVOLIN R,HUMULIN R) 250 units/2.29mL (100 units/mL) injection Inject 5-40 Units into the skin 3 (three) times daily before meals.    . isosorbide dinitrate (ISORDIL) 10 MG tablet TAKE 1 TABLET BY MOUTH TWICE A DAY 60 tablet 6  . levothyroxine (SYNTHROID, LEVOTHROID) 50 MCG tablet Take 50 mcg by mouth daily before breakfast.    . losartan (COZAAR) 50 MG tablet TAKE 1 TABLET BY MOUTH DAILY 90 tablet 0  . metolazone  (ZAROXOLYN) 2.5 MG tablet Take 2.5 mg by mouth once a week. Take 1 tablet every Wednesday    . nitroGLYCERIN (NITROSTAT) 0.4 MG SL tablet PLACE 1 TABLET (0.4 MG TOTAL) UNDER THE TONGUE EVERY 5 (FIVE) MINUTES AS NEEDED FOR CHEST PAIN. 25 tablet 1  . PARoxetine (PAXIL-CR) 25 MG 24 hr tablet Take 25 mg by mouth daily. Take with 25 mg tablet daily (total 62.5 mg)    . PARoxetine (PAXIL-CR) 37.5 MG 24 hr tablet Take 37.5 mg by mouth daily. Take with 25 mg tablet daily (total 62.5 mg)    . pregabalin (LYRICA) 150 MG capsule Take 150 mg by mouth 3 (three) times daily. Reported on 10/31/2015    . torsemide (DEMADEX) 20 MG tablet Take 4 tablets (80 mg total) by mouth 2 (two) times daily. 240 tablet 3   No current facility-administered medications for this encounter.     Vitals:   09/17/16 1500  BP: (!) 150/108  Pulse: 93  SpO2: 97%  Weight: 289 lb (131.1 kg)   Wt Readings from Last 3 Encounters:  09/17/16 289 lb (131.1 kg)  08/28/16 283 lb 11.2 oz (128.7 kg)  08/19/16 281 lb 9.6 oz (127.7 kg)    Chest circumference 134 cm   PHYSICAL EXAM: General:  Well appearing. No resp difficulty HEENT: normal Neck: supple. no JVD. Carotids 2+ bilat; no bruits. No lymphadenopathy or thryomegaly appreciated. Cor: PMI nondisplaced. Regular rate & rhythm. No rubs, gallops or murmurs. Lungs: clear Abdomen: soft, nontender, nondistended. No hepatosplenomegaly. No bruits or masses. Good bowel sounds. Extremities: no cyanosis, clubbing, rash, edema Neuro: alert & orientedx3, cranial nerves grossly intact. moves all 4 extremities w/o difficulty. Affect pleasant  ASSESSMENT & PLAN: 1. Chronic Diastolic Heart Failure: NICM, EF 55-60% Grade II DD(06/2015 )  NYHA III. Volume status elevated. Continue current dose torsemide + metolazone weekly. She will also take an extra dose to metolazone this week.  Extensive conversation about low diet and to eliminate gatorade. Needs to limit fluid intake .  2.  Uncontrolled DM:    - Uncontrolled despite close follow up with Endocrinology.    4. CAD - No CP. Continue asa.  5. Obesity: -Body mass index is 46.65 kg/m. Needs to lose weight.    6. HTN Elevated. I stressed medication compliance.   7. CKD Stage III- BMET next vist.  8. Day time fatigue - Refuses sleep study.    Follow up in 6 weeks to reassess.  Ideally she need admit to tune up however she declines.        Susan Aldaba NP-C   3:06 PM

## 2016-09-18 ENCOUNTER — Other Ambulatory Visit: Payer: Self-pay | Admitting: *Deleted

## 2016-09-18 ENCOUNTER — Encounter: Payer: Self-pay | Admitting: *Deleted

## 2016-09-18 NOTE — Patient Outreach (Signed)
[place] [facility]) Care Management [place] Telephone Outreach  09/18/2016  [name] [birth year] [id number]  Unsuccessful telephone outreach x 3 attempts to Susan [name], 66 y.o. female well known to [place] for for self-management of chronic disease states of CHF and DM. [name] had 3 inpatient hospital visits in 2017, but no hospital readmissions for the last 10-plusmonths (since June 2017).   Unable to leave patient HIPAA compliant voice mail message today, as her phone was busy with each attempt.  Plan:  Will re-attempt [name] telephone outreach tomorrow if I do not hear back from patient first.  [name], RN, BSN, [place] Coordinator [facility] Care Management  [phone]

## 2016-09-19 ENCOUNTER — Other Ambulatory Visit: Payer: Self-pay | Admitting: *Deleted

## 2016-09-19 ENCOUNTER — Encounter: Payer: Self-pay | Admitting: *Deleted

## 2016-09-19 NOTE — Patient Outreach (Signed)
[facility] Care Management [place] Telephone Outreach  09/19/2016  Susan [name] 10-28-1950 [id number]   Successful telephone outreach to [name], 66 y.o.femalewell known to [name] for for self-management of chronic disease states of CHF and DM. Susan [name] had 3 inpatient hospital visits in2017, but no hospital readmissions for the last 10-plusmonths (since June 2017).  HIPAA/ identity verified with patient during phone call today.  [name] sounds to be in no obvious distress throughout today's 30 minute phone call.  Today, Susan [name] reports that she is "not doing very good," and acknowledges that she attended [name] appointment this week on Wednesday 09/17/16, where "they recommended I go to the hospital."  Patient confirms that she has continued gaining weight and having increased shortness of breath at times.  Patient stated that she "could not go" the hospital as recommended by [name] provider, stating that she prefers to stay at home and manage her chronic states of CHF and DM.  Susan [name] confirms that she took "extra" metolazone as instructed by [name] provider, and verbalized plans to take "another one on Saturday," as instructed during office visit.  Susan [name] again reports today that she has been experiencing increased urinary incontinence during sleep over last , stating that she "wakes up soaked with urine."  Reports that she forgot to discuss this issue with [name] provider during office visit this week.  -- Medications:  Susan [name] reports that she has and is taking all of her medications.  Susan [name] is able to verbalize accurate report of current insulin dosing and instructions for extra diuretic, per [name] instructions.  -- Safety/ mobility/ falls: Susan [name] previously reported recent fall on Friday 08/08/16, states has been using walker regularly, "more than usual." Fall risks/ prevention education again reinforced with patient today.   -- Provider appointments:  Attended  last PCP visit after her ED visit, on August 06, 2016, Heart Failure Clinic visit on Wednesday 09/17/16.  Attended Eye doctor for "retina check" on 09/16/16; stated that she will return to eye doctor May 2, and Oct 10, 2016 for ongoing treatment, as eye doctor told her "both eyes have swelling around them."  Denies questions or concerns post- provider visits.  All upcoming provider appointments reviewed with patient today.  Patient verbalizes plans to attend all scheduled provider appointments, stating she will drive herself to appointments.  Endocrinology appointment on 09/23/16.  Self-health management of chronic disease states of:   -- DM: last office visit withDr. [name] on May 27, 2016; has generally been following new insulin guidelines from January 2018 visit, with exception of recent viral illness,which is now resolved.  Reports sugars "running mostly in 300's" with a fasting blood sugar this morning of 124.  Denies episodes/ signs/ symptoms hypo- or hyper-glycemia.  Patient continues to state that she "does not know what" she should and should not be eating for DM; acknowledges that she has printed educational materials on same, and patient reports that she has/ will continue to review and will "try to start eating better."  Last known Hgb A1-C: 11.9 per Dr. [name] Nov 2017; reviewed A1-C ranges over last year with patient, and encouraged her to discuss need for updated A1-C; patient verbalizes understanding and agreement.  -- CHF:  has continued monitoring and recording daily weights; reports weight today of 289lbs (patient's scales at home).  Reports "more shortness of breath" with activity, noted as well during [name] appointment Wednesday 09/17/16.  States "open area" on her posterior (R) leg, mid way up shin "is  about the same" and stated that she showed this area to [name] provider during office visit this week.  Reports continues to keep covered with C/D bandage.  States area is now oozing  "clear yellow fluid."  Reports ongoing bilateral LE with baseline swelling at +2-3 with (L) > (R).   Susan [name] denies recent chest pain and tightness.   We thoroughly discussed today reasons that patient should contact medical providers, including increased weight gain > 3 lbs overnight, 5 lbs in one week, increased shortness of breath, signs/ symptoms hypo- hyper glycemia.  Reasons to call EMS/ 911 were again thoroughly discussed with patient during phone call today.  Patient denies further issues, concerns, or problems today. I confirmed that patient hasmy direct phone number, the main [name] CM office phone number, and the [name] CM 24-hour nurse advice phone number should issues arise prior to next scheduled [place] outreach with routine home visit in 3 weeks.  Plan:   Susan [name] will continue to take all of her medications as they are prescribed and will attend all scheduled provider appointments.  Susan [name] will call EMS for any urgent or emergent issues or concerns she experiences.Susan [name] will contact her care/ medical providers to report concerns with her blood sugars running high or low, signs and symptoms of fluid overload in the setting of CHF, any falls or feelings of illnessshe experiences.   Susan [name] will continue to monitor and record her daily weight and blood sugars TID, and will take these values to ALLprovider appointments with her.  Susan [name] will continue reviewing medication list provided to her by [facility] Pharmacist to begin increasing her knowledge about the general purpose of her medications.  [place] outreach to continue with scheduled [place] routine home visit next month.  [name], RN, BSN, [employer] [name] Care Management  [phone]

## 2016-09-23 DIAGNOSIS — I10 Essential (primary) hypertension: Secondary | ICD-10-CM | POA: Diagnosis not present

## 2016-09-23 DIAGNOSIS — R809 Proteinuria, unspecified: Secondary | ICD-10-CM | POA: Diagnosis not present

## 2016-09-23 DIAGNOSIS — G609 Hereditary and idiopathic neuropathy, unspecified: Secondary | ICD-10-CM | POA: Diagnosis not present

## 2016-09-23 DIAGNOSIS — E1165 Type 2 diabetes mellitus with hyperglycemia: Secondary | ICD-10-CM | POA: Diagnosis not present

## 2016-09-24 DIAGNOSIS — E113412 Type 2 diabetes mellitus with severe nonproliferative diabetic retinopathy with macular edema, left eye: Secondary | ICD-10-CM | POA: Diagnosis not present

## 2016-09-30 DIAGNOSIS — E113411 Type 2 diabetes mellitus with severe nonproliferative diabetic retinopathy with macular edema, right eye: Secondary | ICD-10-CM | POA: Diagnosis not present

## 2016-10-03 ENCOUNTER — Other Ambulatory Visit: Payer: Self-pay | Admitting: Pharmacist

## 2016-10-03 NOTE — Patient Outreach (Signed)
[place] [facility]) Care Management  10/03/2016  Nyleah Mcginnis 09/05/1950 [id number]   Patient was called to follow up on medication management and adherence. HIPAA identifiers were obtained. Patient is a 66 year old female with multiple medical conditions including but not limited to:  year old female with multiple medical conditions including:  Type 2 diabetes, hypothyroidism, heart failure NYHA class III CAD, history of MI, TIA, obesity and CKD stage 3. She visited the ED 07/28/2016 for hyperglycemia.  Although the patient has not been back to the ED or been hospitalized since 07/28/16, she saw her cardiologist 09/17/16 and was fluid overloaded and her blood pressure was elevated. The cardiologist stressed the importance of decreasing her salt and fluid intake as well as medication compliance.  Patient reported she is taking all of her medications as prescribed and did not have any complaints today.   Plan: I will accompany [place] Nurse, Reginia Naas for a home visit to assess compliance on 10/09/16.   [name], PharmD, [place] Clinical Pharmacist [phone]

## 2016-10-08 ENCOUNTER — Other Ambulatory Visit: Payer: Self-pay | Admitting: Pharmacist
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
## 2016-10-09 ENCOUNTER — Ambulatory Visit: Payer: Self-pay | Admitting: Pharmacist

## 2016-10-09 ENCOUNTER — Encounter: Payer: Self-pay | Admitting: *Deleted

## 2016-10-09 ENCOUNTER — Other Ambulatory Visit: Payer: Self-pay | Admitting: *Deleted
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
## 2016-10-17 ENCOUNTER — Other Ambulatory Visit (HOSPITAL_COMMUNITY): Payer: Self-pay | Admitting: Internal Medicine

## 2016-10-22 ENCOUNTER — Other Ambulatory Visit: Payer: Self-pay | Admitting: *Deleted

## 2016-10-22 ENCOUNTER — Encounter: Payer: Self-pay | Admitting: *Deleted
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
## 2016-10-23 ENCOUNTER — Other Ambulatory Visit: Payer: Self-pay | Admitting: *Deleted

## 2016-10-23 ENCOUNTER — Ambulatory Visit: Payer: Self-pay | Admitting: *Deleted

## 2016-10-23 ENCOUNTER — Ambulatory Visit: Payer: Self-pay | Admitting: Pharmacist

## 2016-10-23 NOTE — Patient Outreach (Signed)
[place] [facility]) Care Management [facility] CM Multidisciplinary Case Discussion 10/23/2016  Susan [name] [birth year] [id number]  [facility] CM Multidisciplinary Case Discussion re: [name] LOVFI,66 y.o.femalewell known to [place] for for self-management of chronic disease states of CHF and DM. [name] has had 3 inpatient hospital visits since January 2017, but no hospital readmissions for the last 11-plusmonths.  [name] CM case brought to [facility] CM Multidisciplinary Case Discussion, as patient [facility] Community CM case has been open > 90 days.  Barriers discussed: -- [place] and eroded access, unsafe for navigation with limited access in wet weather  -- Limited family support/ involvement, with occasionally reported issues around family dynamics -- Patient chronically ill/ fragile state with DM, CHF, CKD -- Patient lack of self-confidence, easily confused with changes in plan of care -- lack of authentic relationship with PCP; lack of trust along with fear of some of medical providers, leading to patient feelings of intimidation by some members of her healthcare team.  Patient completely trusts endocrinology and [facility] providers. Patient does not wish to have female providers if possible  [facility] CM Team Discussion: -- Patient has made progress in [place] CM goals, but requires frequent reinforcement of same, as she frequently experiences confusion with changes in plan of care. -- Patient complex/ fragile and frequently has episodes of hypo- and/or hyper-glycemia, falls, as well as self-health management strategies around CHF management, although she has developed good general self-health management strategies over the last year -- Patient resists seeking health care when she is sick or has urgent care need -- Patient resists seeing alternative PCP, due to proximity of provider to her home, along with perceived fear of reaction from current provider --  Patient should be ready for transition to [place], but resists this change as she is comfortable with [facility] Community RN CM and [facility] Pharmacist, both currently/ actively involved in patient's care  Plan: -- [place] RN CM and Pharmacy joint visit 11/06/16, will discuss with patient overall progress with [facility] Community CM goals  -- Will re-approach patient with possibility of patient visiting another PCP (female) provider at same practice for her complex chronic needs, for her consideration -- [facility] Community RN CM will collaborate with [place] for possible dates for joint home visit to patient's home in the near future -- Transition to [place] as soon as possible after next home visit scheduled 11/06/16 -- Once transfer to [place] is successfully made, [place] CM will remain available to [place] for joint telephone outreach to patient, as indicated  [name], RN, BSN, [employer] Coordinator [facility] Care Management  [phone]

## 2016-10-30 ENCOUNTER — Ambulatory Visit (HOSPITAL_COMMUNITY)
Admission: RE | Admit: 2016-10-30 | Discharge: 2016-10-30 | Disposition: A | Discharge status: Home / Self Care | Payer: PPO | Source: Ambulatory Visit | Attending: Internal Medicine | Admitting: Internal Medicine

## 2016-10-30 VITALS — BP 148/90 | HR 91 | Wt 283.6 lb

## 2016-10-30 DIAGNOSIS — Z6841 Body Mass Index (BMI) 40.0 and over, adult: Secondary | ICD-10-CM | POA: Diagnosis not present

## 2016-10-30 DIAGNOSIS — I252 Old myocardial infarction: Secondary | ICD-10-CM | POA: Diagnosis not present

## 2016-10-30 DIAGNOSIS — E1122 Type 2 diabetes mellitus with diabetic chronic kidney disease: Secondary | ICD-10-CM | POA: Diagnosis not present

## 2016-10-30 DIAGNOSIS — Z7982 Long term (current) use of aspirin: Secondary | ICD-10-CM | POA: Diagnosis not present

## 2016-10-30 DIAGNOSIS — R5383 Other fatigue: Secondary | ICD-10-CM | POA: Diagnosis not present

## 2016-10-30 DIAGNOSIS — N183 Chronic kidney disease, stage 3 (moderate): Secondary | ICD-10-CM | POA: Diagnosis not present

## 2016-10-30 DIAGNOSIS — Z79899 Other long term (current) drug therapy: Secondary | ICD-10-CM | POA: Diagnosis not present

## 2016-10-30 DIAGNOSIS — E669 Obesity, unspecified: Secondary | ICD-10-CM | POA: Insufficient documentation

## 2016-10-30 DIAGNOSIS — I2510 Atherosclerotic heart disease of native coronary artery without angina pectoris: Secondary | ICD-10-CM | POA: Insufficient documentation

## 2016-10-30 DIAGNOSIS — Z87891 Personal history of nicotine dependence: Secondary | ICD-10-CM | POA: Insufficient documentation

## 2016-10-30 DIAGNOSIS — I5032 Chronic diastolic (congestive) heart failure: Secondary | ICD-10-CM | POA: Insufficient documentation

## 2016-10-30 DIAGNOSIS — I130 Hypertensive heart and chronic kidney disease with heart failure and stage 1 through stage 4 chronic kidney disease, or unspecified chronic kidney disease: Secondary | ICD-10-CM | POA: Diagnosis not present

## 2016-10-30 DIAGNOSIS — I429 Cardiomyopathy, unspecified: Secondary | ICD-10-CM | POA: Diagnosis not present

## 2016-10-30 DIAGNOSIS — Z794 Long term (current) use of insulin: Secondary | ICD-10-CM | POA: Diagnosis not present

## 2016-10-30 DIAGNOSIS — Z8673 Personal history of transient ischemic attack (TIA), and cerebral infarction without residual deficits: Secondary | ICD-10-CM | POA: Insufficient documentation

## 2016-10-30 NOTE — Progress Notes (Signed)
Advanced Heart Failure Medication Review by a Pharmacist  Does the patient  feel that his/her medications are working for him/her?  yes  Has the patient been experiencing any side effects to the medications prescribed?  no  Does the patient measure his/her own blood pressure or blood glucose at home?  yes   Does the patient have any problems obtaining medications due to transportation or finances?   no  Understanding of regimen: good Understanding of indications: good Potential of compliance: good Patient understands to avoid NSAIDs. Patient understands to avoid decongestants.  Issues to address at subsequent visits: adherence to twice weekly metolazone.    Pharmacist comments: Ms. Rahilly is a 66 yo female presenting without bottles or medication list but with good understanding of medication regimen. Patient complains of CP when she lays down, requires 2-3 pillows to lay flat. She has not taken any PRN SL NTG for the CP as it goes away on its own. She never increased her metolazone to twice weekly as directed during last visit. No other issues per patient.     Time with patient: 10 Preparation and documentation time: 5 mins Total time: 15 mins  Carlean Jews, Pharm.D. PGY1 Pharmacy Resident 6/7/20183:17 PM Pager [phone]

## 2016-10-30 NOTE — Patient Instructions (Signed)
Follow up in 2 Months

## 2016-10-30 NOTE — Progress Notes (Signed)
fPatient ID: Susan [name], female   DOB: Jun 05, 1950, 66 y.o.   MRN: [id number]   PCP: Dr Edilia Bo  Cardiologist: Dr Myrtis Ser  Endocrinologist: Dr Lurene Shadow  Primary HF Cardiologist: Dr. Gala Romney  [facility] Patient  HPI: Susan [name] is a 66 year old with a history of RBBB, DM, Hypothyroid, diastolic heart failure, CAD MI 2000/2007 BMS 2007, TIA, obesity. Admitted with hyperglycemia x 2 in 2017.   Admitted June 1st through June 3rd, 2017 with altered mental status, hypothermia, and weakness. Diuretics initially held and later restarted. Unfortunately when she was discharged she refused home health.  Today she returns for HF follow up. Overall feels ok. SOB with exertion but this is her baseline. Denies PND/Orthopnea. Weight at home 284-290 pounds. Lower extremity edema improved. Not following a particular diet. She does check blood glucose. Drinks > 2 liters fluid.   ECHO 07/11/2015- 50-55%.  Grade I DD  RHC 09/22/13: RA = 4  RV = 30/5/7  PA = 25/10 (16)  PCW = 7  Fick cardiac output/index = 6.3/2.7  PVR = 1.5 WU   FA sat = 93%  PA sat = 61%, 66%   SH: Former smoker quit 15 year ago. Does drink alcohol. She is disabled. Lives at home with her husband and disabled son - Husband is truck Hospital doctor. Son has a brain injury after he attempted suicide  A few years ago. .    FH: Mom MI  ROS: All systems negative except as listed in HPI, PMH and Problem List.  Past Medical History:  Diagnosis Date  . Acute MI (HCC) 1999; 2007  . Anemia    hx  . Anginal pain (HCC)   . Anxiety   . Arthritis    "generalized" (03/15/2014)  . CAD (coronary artery disease)    MI in 2000 - MI  2007 - treated bare metal stent (no nuclear since then as 9/11)  . Carotid artery disease (HCC)   . CHF (congestive heart failure) (HCC)   . Chronic diastolic heart failure (HCC)    a) ECHO (08/2013) EF 55-60% and RV function nl b) RHC (08/2013) RA 4, RV 30/5/7, PA 25/10 (16), PCWP 7, Fick CO/CI 6.3/2.7, PVR 1.5 WU, PA 61 and 66%  .  Daily headache    "~ every other day; since I fell in June" (03/15/2014)  . Depression   . Dyslipidemia   . Exertional shortness of breath   . HTN (hypertension)   . Hypothyroidism   . Obesity   . Osteoarthritis   . Peripheral neuropathy   . PONV (postoperative nausea and vomiting)   . RBBB (right bundle branch block)    Old  . Renal insufficiency    DENIES  . Stroke [facility])    mini strokes  . Syncope    likely due to low blood sugar  . Tachycardia    Sinus tachycardia  . Type II diabetes mellitus (HCC)   . Urinary incontinence   . Venous insufficiency     Current Outpatient Prescriptions  Medication Sig Dispense Refill  . albuterol (PROVENTIL HFA;VENTOLIN HFA) 108 (90 BASE) MCG/ACT inhaler Inhale 2 puffs into the lungs every 6 (six) hours as needed for wheezing or shortness of breath. Reported on 10/30/2015    . aspirin EC 81 MG tablet Take 81 mg by mouth daily.    . carvedilol (COREG) 12.5 MG tablet TAKE 1 AND 1/2 TABLETS BY MOUTH TWICE A DAY WITH A MEAL 90 tablet 3  . clonazePAM (KLONOPIN) 1  MG tablet TAKE 1 TABLET BY MOUTH 3 TIMES A DAY 90 tablet 2  . clopidogrel (PLAVIX) 75 MG tablet TAKE 1 TABLET (75 MG TOTAL) BY MOUTH DAILY WITH BREAKFAST. 90 tablet 2  . ferrous sulfate 325 (65 FE) MG tablet Take 325 mg by mouth daily with breakfast. Reported on 08/21/2015    . insulin NPH Human (HUMULIN N,NOVOLIN N) 100 UNIT/ML injection Inject 60 Units into the skin every morning.    . insulin regular (NOVOLIN R,HUMULIN R) 250 units/2.40mL (100 units/mL) injection Inject 5-40 Units into the skin 3 (three) times daily before meals.    . isosorbide dinitrate (ISORDIL) 10 MG tablet TAKE 1 TABLET BY MOUTH TWICE A DAY 60 tablet 6  . levothyroxine (SYNTHROID, LEVOTHROID) 50 MCG tablet Take 50 mcg by mouth daily before breakfast.    . losartan (COZAAR) 50 MG tablet TAKE 1 TABLET BY MOUTH DAILY 90 tablet 0  . metolazone (ZAROXOLYN) 2.5 MG tablet Take 2.5 mg by mouth once a week. Take 1 tablet every  Wednesday    . nitroGLYCERIN (NITROSTAT) 0.4 MG SL tablet PLACE 1 TABLET (0.4 MG TOTAL) UNDER THE TONGUE EVERY 5 (FIVE) MINUTES AS NEEDED FOR CHEST PAIN. 25 tablet 1  . PARoxetine (PAXIL-CR) 25 MG 24 hr tablet Take 25 mg by mouth daily. Take with 25 mg tablet daily (total 62.5 mg)    . PARoxetine (PAXIL-CR) 37.5 MG 24 hr tablet Take 37.5 mg by mouth daily. Take with 25 mg tablet daily (total 62.5 mg)    . pregabalin (LYRICA) 150 MG capsule Take 150 mg by mouth 3 (three) times daily. Reported on 10/31/2015    . torsemide (DEMADEX) 20 MG tablet Take 4 tablets (80 mg total) by mouth 2 (two) times daily. 240 tablet 3  . metolazone (ZAROXOLYN) 2.5 MG tablet TAKE 1 TABLET BY MOUTH TWICE WEEKLY (TUESDAY AND WEDNESDAY) WEIGHT GAIN/SHORTNESS OF BREATH/SWELLING (Patient not taking: Reported on 10/30/2016) 10 tablet 1   No current facility-administered medications for this encounter.     Vitals:   10/30/16 1459  BP: (!) 148/90  Pulse: 91  SpO2: 95%  Weight: 283 lb 9.6 oz (128.6 kg)   Wt Readings from Last 3 Encounters:  10/30/16 283 lb 9.6 oz (128.6 kg)  09/17/16 289 lb (131.1 kg)  08/28/16 283 lb 11.2 oz (128.7 kg)    Chest circumference 134 cm   PHYSICAL EXAM: General:  Well appearing. No resp difficulty. Ambulated in the clinic.  HEENT: normal Neck: supple. JVP 8-9. Carotids 2+ bilat; no bruits. No lymphadenopathy or thryomegaly appreciated. Cor: PMI nondisplaced. Regular rate & rhythm. No rubs, gallops or murmurs. Lungs: clear Abdomen: soft, nontender, nondistended. No hepatosplenomegaly. No bruits or masses. Good bowel sounds. Extremities: no cyanosis, clubbing, rash, edema Neuro: alert & orientedx3, cranial nerves grossly intact. moves all 4 extremities w/o difficulty. Affect pleasant   ASSESSMENT & PLAN: 1. Chronic Diastolic Heart Failure: NICM, EF 55-60% Grade II DD(06/2015 )  NYHA III. Volume status stable. Continue current diuretic doses.  Reinforced low salt diet and limiting fluid  intake.  Do the following things EVERYDAY: 1) Weigh yourself in the morning before breakfast. Write it down and keep it in a log. 2) Take your medicines as prescribed 3) Eat low salt foods-Limit salt (sodium) to 2000 mg per day.  4) Stay as active as you can everyday 5) Limit all fluids for the day to less than 2 liters 2.  Uncontrolled DM:   - Uncontrolled despite close follow up with  Endocrinology.    4. CAD - No CP. Continue asa.  5. Obesity: -Body mass index is 45.77 kg/m. Discussed weight loss.    6. HTN Elevated stressed med compliance.   7. CKD Stage III-Check BMET next visit.  8. Day time fatigue - Refuses sleep study.     Follow up in 2 months.       Neliah Cuyler NP-C   3:11 PM

## 2016-11-06 ENCOUNTER — Encounter: Payer: Self-pay | Admitting: *Deleted

## 2016-11-06 ENCOUNTER — Other Ambulatory Visit: Payer: Self-pay | Admitting: *Deleted

## 2016-11-06 ENCOUNTER — Encounter: Payer: Self-pay | Admitting: Pharmacist

## 2016-11-06 ENCOUNTER — Other Ambulatory Visit: Payer: Self-pay | Admitting: Pharmacist

## 2016-11-06 NOTE — Patient Outreach (Signed)
[place] [facility]) Care Management  [place]   11/06/2016  Tersa Fotopoulos [birth year] [id number]  Subjective: Home visit completed with patient at her home along with [place], Reginia Naas.  Patient is a 66 year old female with multiple medical conditions including:  Type 2 diabetes, hypothyroidism, heart failure NYHA class III CAD, history of MI, TIA, obesity and CKD stage 3. She visited the ED 07/28/2016 for hyperglycemia.   Patient manges her own medications and uses a pill box.   Objective:   B/P- 128/84 Pulse-78 Weight-284.4 R-18 O2-93%   Encounter Medications: Outpatient Encounter Prescriptions as of 11/06/2016  Medication Sig Note  . albuterol (PROVENTIL HFA;VENTOLIN HFA) 108 (90 BASE) MCG/ACT inhaler Inhale 2 puffs into the lungs every 6 (six) hours as needed for wheezing or shortness of breath. Reported on 10/30/2015   . aspirin EC 81 MG tablet Take 81 mg by mouth daily.   . carvedilol (COREG) 12.5 MG tablet TAKE 1 AND 1/2 TABLETS BY MOUTH TWICE A DAY WITH A MEAL   . clopidogrel (PLAVIX) 75 MG tablet TAKE 1 TABLET (75 MG TOTAL) BY MOUTH DAILY WITH BREAKFAST.   . ferrous sulfate 325 (65 FE) MG tablet Take 325 mg by mouth daily with breakfast. Reported on 08/21/2015   . insulin NPH Human (HUMULIN N,NOVOLIN N) 100 UNIT/ML injection Inject 60 Units into the skin every morning.   . insulin regular (NOVOLIN R,HUMULIN R) 250 units/2.45mL (100 units/mL) injection Inject 5-40 Units into the skin 3 (three) times daily before meals.   . isosorbide dinitrate (ISORDIL) 10 MG tablet TAKE 1 TABLET BY MOUTH TWICE A DAY   . losartan (COZAAR) 50 MG tablet TAKE 1 TABLET BY MOUTH DAILY   . metolazone (ZAROXOLYN) 2.5 MG tablet TAKE 1 TABLET BY MOUTH TWICE WEEKLY (TUESDAY AND WEDNESDAY) WEIGHT GAIN/SHORTNESS OF BREATH/SWELLING   . nitroGLYCERIN (NITROSTAT) 0.4 MG SL tablet PLACE 1 TABLET (0.4 MG TOTAL) UNDER THE TONGUE EVERY 5 (FIVE) MINUTES AS NEEDED FOR CHEST PAIN.    Marland Kitchen PARoxetine (PAXIL-CR) 25 MG 24 hr tablet Take 25 mg by mouth daily. Take with 25 mg tablet daily (total 62.5 mg)   . PARoxetine (PAXIL-CR) 37.5 MG 24 hr tablet Take 37.5 mg by mouth daily. Take with 25 mg tablet daily (total 62.5 mg)   . pregabalin (LYRICA) 150 MG capsule Take 150 mg by mouth 3 (three) times daily. Reported on 10/31/2015   . torsemide (DEMADEX) 20 MG tablet Take 4 tablets (80 mg total) by mouth 2 (two) times daily.   . clonazePAM (KLONOPIN) 1 MG tablet TAKE 1 TABLET BY MOUTH 3 TIMES A DAY   . levothyroxine (SYNTHROID, LEVOTHROID) 50 MCG tablet Take 50 mcg by mouth daily before breakfast.   . [DISCONTINUED] metolazone (ZAROXOLYN) 2.5 MG tablet Take 2.5 mg by mouth once a week. Take 1 tablet every Wednesday 06/03/2016: Patient reports takes on Wednesday's or Thursday's depending on her schedule   No facility-administered encounter medications on file as of 11/06/2016.     Functional Status: In your present state of health, do you have any difficulty performing the following activities: 02/26/2016  Hearing? N  Vision? N  Difficulty concentrating or making decisions? Y  Walking or climbing stairs? Y  Dressing or bathing? N  Doing errands, shopping? N  Preparing Food and eating ? N  Using the Toilet? N  In the past six months, have you accidently leaked urine? Y  Do you have problems with loss of bowel control? N  Managing your Medications? N  Managing your Finances? N  Housekeeping or managing your Housekeeping? N  Some recent data might be hidden    Fall/Depression Screening: Fall Risk  11/06/2016 10/09/2016 09/19/2016  Falls in the past year? (No Data) (No Data) (No Data)  Number falls in past yr: - - -  Injury with Fall? - - -  Risk Factor Category  - - -  Risk for fall due to : - Impaired balance/gait;History of fall(s) -  Risk for fall due to (comments): - - -  Follow up - Education provided;Falls prevention discussed -   PHQ 2/9 Scores 08/06/2016 12/18/2015  10/19/2015 08/09/2015 08/07/2015 03/16/2015  PHQ - 2 Score 6 4 4 4 4  0  PHQ- 9 Score 26 13 16 16 16  0      Assessment: Patient's medications were reviewed in her home.  Drugs sorted by system:  Neurologic/Psychologic: Clonazepam Paroxetine (37.5mg  & 25mg  tablets)--PHQ9 score - 26 (08/06/16) Lyrica  Cardiovascular: Torsemide Aspirin Carvedilol Clopidogrel Isosorbide Dinitrate Nitroglycerin Metolazone Losartan   Pulmonary/Allergy: Proventil Respiclick   Endocrine: Humulin N Humulin R Levothyroxine  Vitamins/Minerals: Ferrous Sulfate   Medication Review Findings:  1.  Adherence- patient was completely out of Levothyroxine and did not have any Levothyroxine tablets in her pill box.  Pharmacy reported the last refill was last filled 06/18/16 for a 30 day supply.  No recent TSH in patient's chart.  2.  Refills needed--Pharmacy Called Nitroglycerin Levothyroxine  3.  Dose Potentially too High-patient is currently taking Lyrica 150mg  1 capsule three times daily.  Patient's calculated creatinine clearance using the Scr from 3/18 labs is 84ml/min.  Manufacturer's dosing guidelines suggest:   CrCl 30-50-- 75 to 300 mg/day in 2 to 3 divided doses.   CrCl 15-30 25 to 150 mg/day given daily or in 2 divided doses  Patient could have more recent labs that would indicate the current dose is appropriate.  4.  Fall Risk Combination of clonazepam and high dose Lyrica may cause CNS depression.  Patient also has balance issues .   5.  Potassium Patient had a bottle of potassium in her possession but is not taking it.  Last BMET was 3/18. At that time, her potassium level was 4.5.  Cardiology will redraw a BMET at the patient's next visit.  Plan: 1.  Route note to PCP   2.  Follow up with the patient in 2 weeks to be sure she got the levothyroxine.  3.  Close pharmacy case at that time as the patient will be transitioned to telephonic care management.   [name], PharmD, [place] Clinical Pharmacist [phone]

## 2016-11-06 NOTE — Patient Outreach (Signed)
[place] [place] [facility]) Care Management [place] Telephone Outreach Care Coordination  11/06/2016  Zena Vitelli 1951-05-04 [id number]  Unsuccessful telephone outreach to Dr. [name], endocrinology provider [phone]) re: Susan JPVGK,66 y.o.femalewell known to [place] for for self-management of chronic disease states of CHF and DM. [name] has had 3 inpatient hospital visits since January 2017, but no hospital readmissions for the last 12-plusmonths.  Left message with Dr. Almetta Lovely office staff Kieth Brightly) requesting call back to follow up on patient's recent lab values.  Received return call back from "[place]" with Dr. [name] who confirmed that patient had recent A1-C on Sep 23, 2016 of 9.9; prior to that value, previous A1-C from November 2017 was 11.9.  Toney Rakes confirmed that patient has not had additional labs (other than A1-C) performed at their office since 2016.    I then contacted and updated [facility] Pharmacist Denyse Amass with this information.   [name], RN, BSN, [employer] [facility] Care Management  [phone]

## 2016-11-06 NOTE — Patient Outreach (Signed)
[place] [facility]) Care Management  [place] Routine Home Visit  11/06/2016  Susan [name] 11/12/1950 [id number]  Susan [name] is a 66 y.o. female well known to [place] for for self-management of chronic disease states of CHF and DM. Susan [name] has had 3 inpatient hospital visits since January 2017, but no hospital readmissions for the last 12-plusmonths.  HIPAA/ identity verified with patient in person today at her home, and [facility] Pharmacist Susan [name] is also present to re- evaluate/ follow up on patient's management and understanding of her medications after last [place] home visit on 08/28/16.  Susan [name] is in no obvious distress throughout entirety of today's in-home visit.  Today, Susan [name] continues to report that she is "doing better," and she denies recent episodes hypo/hyper glycemia, but does report that she has had several recent episodes of chest pain which was relieved by "taking 2 or 3 NTG."  Susan [name] denies chest pain today  -- Medications:  Susan [name] reports that she has and is taking all of her medications.  Susan [name] is able to verbalize a general understanding of the scheduling/ dosing of her medications.  [facility] Pharmacist Susan [name] thoroughly reviewed each of patient's medications during home visit today, and confirmed that patient successfully completed accurate filling of her pill box.  Susan [name] admitted today that she has not been taking levothyroxine, and Katina and I discussed this thorughly with patient, and patient was advised to resume taking this medication as prescribed, which Susan [name] agrees to do after discussion. See [place] note from today's visit for additional details.  -- safety/ mobility/ falls: Susan [name] previously reported fall on Friday 08/08/16, and denies any new falls since then.  Gait remains guarded/ unsteady, and Susan [name] reports using walker as needed for mobility, although she is not using today and is obviously unsteady with ambulation.  [name] was  present in Susan [name]'s living room, and offer was made to bring her the walker, but she declined.  Susan [name] states that she "gets off balance" easily, and we had previously discussed her contacting her insurance provider to determine her coverage for OP PT; Susan [name] reports that she did not contact her insurance provider, as she discussed this possibility with her husband, who advised against it; stated that she has independently been doing PT exercises "from when I had home health PT a year ago," stating that she "remembers" the exercises and does them "as much as" she can every week.  Fall risks/ prevention education again discussed with patient  -- Provider appointments:  Attended Endocrinology appointment on Sep 23, 2016 and  Heart Failure Clinic visit on October 27, 2016.  Denies questions or concerns post- provider visits.  All previous and upcoming provider appointments reviewed with patient today.  Patient verbalizes plans to attend all scheduled provider appointments, stating she will drive herself to appointments.  We again discussed possibility of patient changing her PCP, due to her complex medical history and needs; patient is now in agreement for facilitation of changing her PCP to Dr. [name] at same practice.  Susan [name] has seen Dr. [name] in the past and verbalized a very good experience with her.  Self-health management of chronic disease states of:   -- DM: last office visit withDr. [name] on Sep 23, 2016; sliding scale insulin was again changed (see below for dosing, new dose was verified on post-visit note written by provider, which Susan [name] keeps and refers to).  States A1-C was drawn during office visit, A1-C on Sep 23, 2016: 9.9 ,  which is a decrease from last A1-C A1-C: 11.9 per Dr. [name] Apr 14, 2016. Positive reinforcement was provided to [name] around her progress in decreasing her A1-C.  Patient verbalizes compliance with insulin administration and daily blood sugar monitoring/ recording.  Reports no  episodes of hypoglycemia, and is able to verbalize signs/ symptoms of hypoglycemia as well as action plan for hypoglycemia. Sick day rules wereagain thoroughly discussed with patient today.  Recorded blood sugar ranges since October 24, 2016: 90-477  New Insulin dosing from Sep 23, 2016 office visit with Dr. [name]:  NPH 60 U q am  Breakfast and Lunch: (Novolog Regular)  70-100 = 5 units 100-150 = 14 units 151-200 = 20 Units 201-250 = 26 units 251- 300 = 30 units 301- 350 = 34 units 351- 400 = 40 Units  Evening meal: (Novolog Regular)  150-200 = 5 units 201- 250 = 10 units 251- 300 = 15 units 301-350 = 20 units  I provided Susan [name] with an updated copy of "Living Well With Diabetes" and encourarged her to review this book and write down any questions she has prior to next [name] home visit.  -- CHF:  has continued monitoring and recording daily weights; reports weight today of 284.4lbs (patient's scales at home).  Denies changes in breathing status, andstates that her legs continue being "muchbetter."  Bilateral LE have baseline swelling at +2-3 with (L) > (R).  No acute/ concerning erythema noted, with baseline chronic venous stasis bilaterally.  General weight range from patient's home scales reviewed with patient, sinceJune 1, 2018, weight has ranged from 283-288lbs. Susan [name] was again counseled to call EMS for her report today of recent chest pain and tightness, should this occur again.  I provided Susan [name] with a copy of low-salt guidelines and discussed thoroughly with her today; I encouraged her to review thoroughly prior to next [name] home visit, and together we reviewed proper way to monitor salt intake by reading food labels.   I discussed again with Susan [name] that she has made great progress with [name], and today, she agrees that she is ready for transfer to [place]; we had previously discussed an in-home visit with the [place], and this was  scheduled today for next month.  Together, we reviewed and summarized overall plan of care and previous education provided around self-health management of chronic disease states of DM and CHF.  Patient denies further issues, concerns, or problems today. I confirmed that patient hasmy direct phone number, the main [facility] CM office phone number, and the [facility] CM 24-hour nurse advice phone number should issues arise prior to next scheduled [place] outreach (home visit next month).  Subjective: "I'm doing pretty good I think, although I've had some recent chest pain.  I am glad my A1-C has gotten better.  I feel ready to change my PCP to Dr. [name] if I can."  Objective:    BP 128/84   Pulse 78   Resp 18   Wt 284 lb 6.4 oz (129 kg)   SpO2 93%   BMI 45.90 kg/m   Review of Systems  Constitutional: Positive for malaise/fatigue.  Respiratory: Positive for shortness of breath. Negative for cough and wheezing.        Baseline SOB with activity of walking around her home  Cardiovascular: Positive for chest pain and leg swelling. Negative for palpitations and orthopnea.       Reports several recent episodes of chest  pain, relieved by 2-3 NTG; patient reports did not call 9-1-1 with chest pain (again counselled to contact EMS if she needs more than 2 NTG to relieve pain). Denies chest pain today during home visit Baseline bilateral LE swelling without erythema/ oozing  Gastrointestinal: Negative for abdominal pain and nausea.  Genitourinary: Positive for frequency and urgency.       Diuretic Rx  Musculoskeletal: Negative for falls.  Neurological: Positive for tingling. Negative for dizziness.       Hx neuropathy  Psychiatric/Behavioral: Positive for depression. The patient is not nervous/anxious.    Physical Exam  Constitutional: She is oriented to person, place, and time. She appears well-developed and well-nourished. No distress.  Cardiovascular: Normal rate, regular rhythm, normal  heart sounds and intact distal pulses.   Respiratory: Effort normal and breath sounds normal. No respiratory distress. She has no wheezes. She has no rales.  See ROS  GI: Soft. Bowel sounds are normal.  Musculoskeletal: She exhibits edema.  See ROS  Neurological: She is alert and oriented to person, place, and time.  Skin: Skin is warm and dry. No erythema.  Psychiatric: She has a normal mood and affect. Her behavior is normal. Judgment and thought content normal.   Assessment:  Susan [name] continues to need reinforcement of self-health management of chronic disease states of CHF and DM, as well as with compliance withinstructions provided to her regarding reasons to seek health care urgently/ emergently, and the complications that can result from non- compliance. Susan [name] has considered changing her current PCP and is now willing to pursue this, and would like to have Dr. [name] as her PCP due to her complex medical history and needs. Susan [name] does nothave a strong support system within her family, and benefits from extrasupport and reinforcement from her healthcare providers. Susan [name] is committed to compliance with her medication regimen and to attending scheduled provider appointments, and has benefitted from additional education/ management strategies around her medications with assistance provided by Susan [name], [facility] Pharmacist.  From an overall standpoint, Susan [name] continues looking well, and is agreement with moving to [place] for ongoing [facility] CM support.  Plan:   Susan [name] will continue to take all of her medications as they are prescribed and will keep all scheduled provider appointments.  Susan [name] will call EMS for any urgent or emergent issues or concerns she experiences.Susan [name] will contact her care/ medical providers to report concerns with her blood sugars running high or low, signs and symptoms of fluid overload in the setting of CHF, any falls, or feelings of illnessshe experiences.    Susan [name] will continue to monitor and record her daily weight and blood sugars TID, and will take these values to ALLprovider appointments with her.  I will contact Dr. [name] to confirm recent lab values.  I will contact PCP Practice Administrator to facilitate patient changing her PCP to Dr. [name].  [facility] CM outreach to continue with scheduled [place] home visit with [place] next month.  [name] CM Care Plan Problem One     Most Recent Value  Care Plan Problem One  High fall risk due to feeling off-balance when ambulating  Role Documenting the Problem One  Care Management [place] for Problem One  Not Active  [name] CM Short Term Goal #1   Over the next 14 days, patient will contact her insurance provider to inquire about coverage for outpatient PT therapy, as evidenced by patient reporting during [facility] RN CM outreach  [name] CM Short Term Goal #1 Start Date  10/22/16  [facility] CM Short Term Goal #1 Met Date  11/06/16  Interventions for Short Term Goal #1  Utilizing teachback method, reinforced previusly discussed fall risks/ education,  encouraged patient to consider outpatient PT to improve balance, and to continue using walker regularly    [facility] CM Care Plan Problem Two     Most Recent Value  Care Plan Problem Two  Patient confusion with medications and successful completion of pill box filling  Role Documenting the Problem Two  Care Management Coordinator  Care Plan for Problem Two  Not Active  Interventions for Problem Two Long Term Goal   Utilizing teachback method, reiterated with patient current medicatons and dosing,  reinforced use of sliding scale insulin guidelines,  discussed general blood sugar ranges with patient since last [facility] RN CM phone call  [name] Long Term Goal  Over the next 60 days., patient will verbalize purpose of 5 of her medications without prompting from [place] or [facility] pharmacist, as evidenced by patient reporting during [facility] RN CM  outreach  [facility] Long Term Goal Start Date  08/28/16  [facility] Long Term Goal Met Date  11/06/16    [facility] CM Care Plan Problem Three     Most Recent Value  Care Plan Problem Three  Ongoing need for reinforcement of self-health management of chronic disease state of DM and CHF  Role Documenting the Problem Three  Care Management Coordinator  Care Plan for Problem Three  Active  [name] Long Term Goal   Over the next 60 days, patient will verbalize plan for insulin use on sick days and signs/ symptoms CHF exacerbation, as evidenced by patient reporting during [place] outreach   [facility] Long Term Goal Start Date  10/22/16  Interventions for Problem Three Long Term Goal  Utilizing teachback method, reviewed/ reinforced/ summarized previous education and provided patient with updated booklet Living Well with Diabetes,  encouraged patient to write down any questions she has prior to next [facility] CM outreach     [name], RN, BSN, [employer] Coordinator [facility] Care Management  [phone]

## 2016-11-06 NOTE — Patient Outreach (Signed)
[place] [facility]) Care Management [place] Telephone Outreach Care Coordination  11/06/2016  Jaidon Sponsel 06-24-1950 [id number]   Unsuccessful telephone outreach to Eliezer Mccoy, Practice Administrator for [place] [phone]) re: [place] FXGXI,66 y.o.femalewell known to [place] for for self-management of chronic disease states of CHF and DM. [name] has had 3 inpatient hospital visits since January 2017, but no hospital readmissions for the last 12-plusmonths.  Spoke with office staff "[place]" who connected me to [place]'s direct voice mail; left detailed voicemail message for [name], explaining that I was calling on behalf of patient to facilitate change in patient's PCP, and requested call back.  Plan:  Will collaborate with practice staff to facilitate changing patient's PCP, as requested by patient.  [name], RN, BSN, [employer] [facility] Care Management  [phone]

## 2016-11-07 ENCOUNTER — Other Ambulatory Visit: Payer: Self-pay | Admitting: Pharmacist

## 2016-11-07 ENCOUNTER — Other Ambulatory Visit: Payer: Self-pay | Admitting: *Deleted

## 2016-11-07 ENCOUNTER — Telehealth: Payer: Self-pay

## 2016-11-07 MED ORDER — PREGABALIN 50 MG PO CAPS: 50.0000 mg | ORAL_CAPSULE | Freq: Three times a day (TID) | ORAL | 5 refills | Status: DC

## 2016-11-07 MED ORDER — LEVOTHYROXINE SODIUM 50 MCG PO TABS: 50.0000 ug | ORAL_TABLET | Freq: Every day | ORAL | 1 refills | Status: DC

## 2016-11-07 NOTE — Telephone Encounter (Signed)
Tried calling pt to inform her of the change as well as the refill.[place]

## 2016-11-07 NOTE — Patient Outreach (Signed)
[place] [name]) Care Management [place] Telephone Outreach Care Coordination  11/07/2016  Kolbie [place] 1950/11/14 [id number]  1:15 pm: Unsuccessful telephone outreach to Eliezer Mccoy, Practice Administrator for [place] [phone]) re: Ameisha OKHTX,66 y.o.femalewell known to [place] for for self-management of chronic disease states of CHF and DM. [name] has had 3 inpatient hospital visits since January 2017, but no hospital readmissions for the last 12-plusmonths.  Practice phone number rang without physical or voicemail pick up; was unable to leave voice mail message.  2:45 pm:  Successfully connected by phone with [name], and explained that patient is interested in switching her PCP due to her complex medical history and needs; patient had verbalized that she had a very good past experience with Dr. [name], feels very comfortable with Dr. [name], and would be interested in having Dr. [name] as her PCP.  [name] shared that she would notify practice providers of patient request and would follow up as indicated.  Plan:  Will collaborate with practice staff to facilitate changing patient's PCP, as requested by patient.  [name], RN, BSN, [employer] [facility] Care Management  [phone]

## 2016-11-07 NOTE — Telephone Encounter (Signed)
-----   Message from [name], PA-C sent at 11/07/2016  6:33 AM EDT ----- Jonelle Sidle--- Please Call Pt: Tell pt to decrease Lyrica to 75mg  1 po BID--Send Rx for # 60 + 5 REMOVE the prior Lyrica dose off med list Tell pt to make sure to get refill of levothyroxine and get back on that medication. -----[name], PA  ----- Message ----- From: [name], [place]: 11/06/2016   8:08 PM To: [name], PA-C  Ms. Dixon,  Please see the attached [facility] Pharmacist's note.  A home visit was completed today in the patient's home.  Of importance: compliance issues with levothyroxine and potential dose too high with Lyrica.  Thank you so much for your time and consideration.   Blessings,   [name], PharmD, [place] Clinical Pharmacist [phone]

## 2016-11-07 NOTE — Patient Outreach (Signed)
[facility]) Care Management  11/07/2016  Susan [name] August 21, 1950 [id number]   Patient called to inquire about her Lyrica dose.  Her provider's office called today and decreased her dose from Lyrica 150mg  1 capsule three times daily to Lyrica 75mg  1 capsule twice daily.  Patient receives Lyrica from [employer] patient assistance program and has Lyrica 150mg  capsules in her possession.  Patient said she told her provider she was going to take Lyrica 150mg  1 capsule daily.  Patient wondered why her dose was decreased.  It was explained to her that the decision to decrease the dose was her provider's choice.  Susan [name] and Susan [name] were sent my note from the home visit where I mentioned that given her Scr from 3/18, her calculated creatinine clearance was 64ml/min.  The manufacturer of Lyrica recommends decreased dosing in patients with renal impairment: (CrCl 30-50-- 75 to 300 mg/day in 2 to 3 divided doses.   CrCl 15-30 25 to 150 mg/day given daily or in 2 divided doses)  I also indicated that the patient could have more recent labs that reflect the dose is appropriate.    Patient was very concerned that she would be in a great deal of pain since her dose was decreased so drastically.  A new BMET should probably be drawn to appropriately assess the dose. However, the patient was instructed to do as her provider told her.  Patient was concerned since Susan [name] originally wrote the Lyrica but the dose was changed by her PCP office.  I assured the patient I would let Susan [name] office know.   Patient was also re-educated on Nitroglycerin replacement.  Susan [name], PharmD, [place] Clinical Pharmacist [phone]

## 2016-11-08 ENCOUNTER — Other Ambulatory Visit: Payer: Self-pay | Admitting: Physician Assistant

## 2016-11-10 NOTE — Telephone Encounter (Signed)
Spoke with pt on 6/15 she is aware of the changes

## 2016-11-12 ENCOUNTER — Other Ambulatory Visit: Payer: Self-pay | Admitting: Pharmacist

## 2016-11-12 ENCOUNTER — Encounter: Payer: Self-pay | Admitting: Family Medicine

## 2016-11-12 ENCOUNTER — Ambulatory Visit (INDEPENDENT_AMBULATORY_CARE_PROVIDER_SITE_OTHER): Payer: PPO | Admitting: Family Medicine

## 2016-11-12 VITALS — BP 136/84 | HR 82 | Temp 98.1°F | Resp 14 | Ht 65.0 in | Wt 285.0 lb

## 2016-11-12 DIAGNOSIS — Z794 Long term (current) use of insulin: Secondary | ICD-10-CM

## 2016-11-12 DIAGNOSIS — I5032 Chronic diastolic (congestive) heart failure: Secondary | ICD-10-CM | POA: Diagnosis not present

## 2016-11-12 DIAGNOSIS — N182 Chronic kidney disease, stage 2 (mild): Secondary | ICD-10-CM | POA: Diagnosis not present

## 2016-11-12 DIAGNOSIS — N183 Chronic kidney disease, stage 3 unspecified: Secondary | ICD-10-CM

## 2016-11-12 DIAGNOSIS — E1122 Type 2 diabetes mellitus with diabetic chronic kidney disease: Secondary | ICD-10-CM

## 2016-11-12 DIAGNOSIS — F331 Major depressive disorder, recurrent, moderate: Secondary | ICD-10-CM | POA: Diagnosis not present

## 2016-11-12 DIAGNOSIS — I10 Essential (primary) hypertension: Secondary | ICD-10-CM

## 2016-11-12 DIAGNOSIS — E1165 Type 2 diabetes mellitus with hyperglycemia: Secondary | ICD-10-CM

## 2016-11-12 DIAGNOSIS — E782 Mixed hyperlipidemia: Secondary | ICD-10-CM | POA: Diagnosis not present

## 2016-11-12 DIAGNOSIS — I2510 Atherosclerotic heart disease of native coronary artery without angina pectoris: Secondary | ICD-10-CM | POA: Diagnosis not present

## 2016-11-12 DIAGNOSIS — E1142 Type 2 diabetes mellitus with diabetic polyneuropathy: Secondary | ICD-10-CM

## 2016-11-12 DIAGNOSIS — E1140 Type 2 diabetes mellitus with diabetic neuropathy, unspecified: Secondary | ICD-10-CM | POA: Insufficient documentation

## 2016-11-12 DIAGNOSIS — IMO0002 Reserved for concepts with insufficient information to code with codable children: Secondary | ICD-10-CM

## 2016-11-12 MED ORDER — PREGABALIN 150 MG PO CAPS: 150.0000 mg | ORAL_CAPSULE | Freq: Two times a day (BID) | ORAL | 2 refills | Status: DC

## 2016-11-12 NOTE — Assessment & Plan Note (Signed)
Blood pressure looks okay my office to change medication.

## 2016-11-12 NOTE — Patient Outreach (Signed)
[place] [facility]) Care Management  11/12/2016  Lakeyshia Tuckerman 1950-08-14 [id number]   Patient was called to follow up on Lyrica dose. No answer. HIPAA compliant message was left on the patient's voicemail.  Patient had a visit with her Provider today.  Lyrica dose was increased to 150mg  twice daily.  Dose was decreased due to the patients renal disease and manufacturer's recommended dosing based on creatinine clearance.  Plan:  Call patient back in 7-10 business days.   [name], PharmD, [place] Clinical Pharmacist [phone]

## 2016-11-12 NOTE — Assessment & Plan Note (Addendum)
Uncontrolled diabetes followed by endocrinology but with multiple comorbidities associated including retinopathy and peripheral neuropathy and chronic kidney disease. A1C uncontrolled 9.9%, seems that her A1c has been in range for the past few years. Significant time discussing dietary changes. She is eating 3 times a day and take all of her insulin injections as prescribed by her endocrinologist. She agrees to get him to nutritionist. He has a lot of emotional eating at home in order eating what she does not think to want to address directly assisting her husband said that she has been seen by a therapist.  She often skips her lunch insulin injection she does typically eat something around 2pm protein bar/shake or other small items but does not give injection advised to give at least 5 units.  states she always checks her sugars are still 300 at this time

## 2016-11-12 NOTE — Assessment & Plan Note (Signed)
Reviewed cardiology notes. Her poor diet for diabetes control her depression on contributing to her worsening cardiac status.

## 2016-11-12 NOTE — Progress Notes (Signed)
Subjective:    Patient ID: Susan [name], female    DOB: 1950-12-31, 66 y.o.   MRN: [id number]  Patient presents for 3 month F/U (is not fasting) Patient here to follow-up chronic medical problems. She has been followed by my physician assistant for the past few years. She's had increased complexity over the past year  And has been monitored by  [facility] both home health nurse as well as the pharmacy. Medications and history reviewed with patient   She is followed by cardiology as well as the heart failure clinic she has history of coronary artery disease as well as myocardial infarction back in 2007, she is non compliant with heart healthy diet, has chronic edema as well  Diabetes mellitus she is being followed by Dr. [name] I received a note from May A1c was still uncontrolled at 9.9% with poor compliance. She does take 60 units of NPH in the morning and she takes sliding scale with meals but she typically only injects about twice a day. Feels that she does not get up until 10:00 and she stays up late at night often snacking until about 2 AM. States that she does eat a lot of carbs potatoes and bread this is what her husband likes E she just eats the same thing.   His history of peripheral neuropathy associated with her diabetes as well as retinopathy chronic kidney disease. She was on Lyrica by her endocrinologist 150 mg 3 times a day is recommended at this dose be decreased. She's been only taking 150 mg once a day and still having severe pain since her dose was decreased.  History of chronic anemia she is on iron supplement states that she is overdue for colonoscopy but does not want to go at this time. Denies any blood in the stool.  Major depression and anxiety has been significant psychosocial stressors at this week with my 40 assistant about this. She notes that taking care of her son has anoxic injury as well as caring for her husband is very difficult. States that  her husband do not get along. She states he is upset that she is on so manymedications and doesn't understand why she cannot get herself together. She has to hide her nerve medication that he's does not like her being on any type of medication for her stressors. Because of this she will often snack when she gets upset and she stays up late snacks.  Review Of Systems:  GEN- denies fatigue, fever, weight loss,weakness, recent illness HEENT- denies eye drainage, change in vision, nasal discharge, CVS- denies chest pain, palpitations RESP- denies SOB, cough, wheeze ABD- denies N/V, change in stools, abd pain GU- denies dysuria, hematuria, dribbling, incontinence MSK- + joint pain, muscle aches, injury Neuro- denies headache, dizziness, syncope, seizure activity       Objective:    BP 136/84   Pulse 82   Temp 98.1 F (36.7 C) (Oral)   Resp 14   Ht 5\' 5"  (1.651 m)   Wt 285 lb (129.3 kg)   SpO2 98%   BMI 47.43 kg/m  GEN- NAD, alert and oriented x3,obese  HEENT- PERRL, EOMI, non injected sclera, pink conjunctiva, MMM, oropharynx clear Neck- Supple, no thyromegaly CVS- RRR, no murmur RESP-CTAB ABD-NABS,soft,NT,ND Psych- depressed affect, not anxious appearing, no SI EXT- 1+  Edema, venous stasis changes/ruddy legs, no hair, thick skin to feet Pulses- Radial 2+, DP-diminihsed         Assessment & Plan:  45 minutes spent with patient > 50% on counseling and  medication reconciliation Problem List Items Addressed This Visit    Hyperlipemia   Morbid obesity (HCC) (Chronic)   CKD (chronic kidney disease) stage 3, GFR 30-59 ml/min (Chronic)   MDD (major depressive disorder)    Continue paxil and klonopin She would benefit significantly from psychotherapy with her eating issues and stressors within her home as she declines going to request time.      Essential hypertension    Blood pressure looks okay my office to change medication.      Diabetic neuropathy (HCC)       Lyrica decreased to 150mg  BID      Diabetes mellitus type II, uncontrolled ([place])    Uncontrolled diabetes followed by endocrinology but with multiple comorbidities associated including retinopathy and peripheral neuropathy and chronic kidney disease. A1C uncontrolled 9.9%, seems that her A1c has been in range for the past few years. Significant time discussing dietary changes. She is eating 3 times a day and take all of her insulin injections as prescribed by her endocrinologist. She agrees to get him to nutritionist. He has a lot of emotional eating at home in order eating what she does not think to want to address directly assisting her husband said that she has been seen by a therapist.  She often skips her lunch insulin injection she does typically eat something around 2pm protein bar/shake or other small items but does not give injection advised to give at least 5 units.  states she always checks her sugars are still 300 at this time      Chronic diastolic CHF (congestive heart failure) ([place]) - Primary (Chronic)    She is on high-dose diuretics until has edema. She has not when her compression hose on a regular basis. Recommend that she wear during the day. She will return to our for fasting labs to check her renal function.      CAD (coronary artery disease) (Chronic)    Reviewed cardiology notes. Her poor diet for diabetes control her depression on contributing to her worsening cardiac status.       Other Visit Diagnoses    Type 2 diabetes mellitus with diabetic polyneuropathy, with long-term current use of insulin (HCC)  (Chronic)      Relevant Medications   pregabalin (LYRICA) 150 MG capsule      Note: This dictation was prepared with Dragon dictation along with smaller phrase technology. Any transcriptional errors that result from this process are unintentional.

## 2016-11-12 NOTE — Addendum Note (Signed)
Addended by: Vic Blackbird F on: 11/12/2016 02:03 PM   Modules accepted: Orders

## 2016-11-12 NOTE — Assessment & Plan Note (Signed)
Continue paxil and klonopin She would benefit significantly from psychotherapy with her eating issues and stressors within her home as she declines going to request time.

## 2016-11-12 NOTE — Assessment & Plan Note (Signed)
   Lyrica decreased to 150mg  BID

## 2016-11-12 NOTE — Assessment & Plan Note (Signed)
>>  ASSESSMENT AND PLAN FOR CHRONIC DIASTOLIC CHF (CONGESTIVE HEART FAILURE) (HCC) WRITTEN ON 11/12/2016  1:52 PM BY Milinda Antis F  She is on high-dose diuretics until has edema. She has not when her compression hose on a regular basis. Recommend that she wear during the day. She will return to our for fasting labs to check her renal function.

## 2016-11-12 NOTE — Patient Instructions (Addendum)
Eat three meals a day- 10am breakfast, Lunch 2pm ( Veggies/protein shake) give 5units, dinner 5:30-6pm.  If you eat snack- veggies/ peanut better/almonds/mixed nuts Lyrica 1 tablet twice a day  [facility] referral F/U IN MORNING FOR LABS- DO NOT TAKE ANY INSULIN IN THE MORNING  F/u 8 WEEKS

## 2016-11-12 NOTE — Assessment & Plan Note (Addendum)
She is on high-dose diuretics until has edema. She has not when her compression hose on a regular basis. Recommend that she wear during the day. She will return to our for fasting labs to check her renal function.

## 2016-11-13 ENCOUNTER — Other Ambulatory Visit: Payer: PPO

## 2016-11-14 ENCOUNTER — Other Ambulatory Visit: Payer: PPO

## 2016-11-14 DIAGNOSIS — Z794 Long term (current) use of insulin: Secondary | ICD-10-CM

## 2016-11-14 DIAGNOSIS — E1142 Type 2 diabetes mellitus with diabetic polyneuropathy: Secondary | ICD-10-CM | POA: Diagnosis not present

## 2016-11-14 DIAGNOSIS — I2510 Atherosclerotic heart disease of native coronary artery without angina pectoris: Secondary | ICD-10-CM

## 2016-11-14 DIAGNOSIS — E782 Mixed hyperlipidemia: Secondary | ICD-10-CM | POA: Diagnosis not present

## 2016-11-14 LAB — COMPREHENSIVE METABOLIC PANEL
ALT: 17 U/L (ref 6–29)
AST: 20 U/L (ref 10–35)
Albumin: 3.7 g/dL (ref 3.6–5.1)
Alkaline Phosphatase: 181 U/L — ABNORMAL HIGH (ref 33–130)
BUN: 63 mg/dL — ABNORMAL HIGH (ref 7–25)
CO2: 26 mmol/L (ref 20–31)
Calcium: 9.2 mg/dL (ref 8.6–10.4)
Chloride: 101 mmol/L (ref 98–110)
Creat: 1.76 mg/dL — ABNORMAL HIGH (ref 0.50–0.99)
Glucose, Bld: 143 mg/dL — ABNORMAL HIGH (ref 70–99)
Potassium: 4.9 mmol/L (ref 3.5–5.3)
Sodium: 138 mmol/L (ref 135–146)
Total Bilirubin: 0.4 mg/dL (ref 0.2–1.2)
Total Protein: 7 g/dL (ref 6.1–8.1)

## 2016-11-14 LAB — CBC WITH DIFFERENTIAL/PLATELET
Basophils Absolute: 69 cells/uL (ref 0–200)
Basophils Relative: 1 %
Eosinophils Absolute: 414 cells/uL (ref 15–500)
Eosinophils Relative: 6 %
HCT: 40 % (ref 35.0–45.0)
Hemoglobin: 12.6 g/dL (ref 12.0–15.0)
Lymphocytes Relative: 14 %
Lymphs Abs: 966 cells/uL (ref 850–3900)
MCH: 25.7 pg — ABNORMAL LOW (ref 27.0–33.0)
MCHC: 31.5 g/dL — ABNORMAL LOW (ref 32.0–36.0)
MCV: 81.5 fL (ref 80.0–100.0)
MPV: 10.7 fL (ref 7.5–12.5)
Monocytes Absolute: 483 cells/uL (ref 200–950)
Monocytes Relative: 7 %
Neutro Abs: 4968 cells/uL (ref 1500–7800)
Neutrophils Relative %: 72 %
Platelets: 238 10*3/uL (ref 140–400)
RBC: 4.91 MIL/uL (ref 3.80–5.10)
RDW: 15.4 % — ABNORMAL HIGH (ref 11.0–15.0)
WBC: 6.9 10*3/uL (ref 3.8–10.8)

## 2016-11-14 LAB — LIPID PANEL
Cholesterol: 295 mg/dL — ABNORMAL HIGH (ref <200)
HDL: 50 mg/dL — ABNORMAL LOW (ref >50)
LDL Cholesterol: 200 mg/dL — ABNORMAL HIGH (ref <100)
Total CHOL/HDL Ratio: 5.9 Ratio — ABNORMAL HIGH (ref <5.0)
Triglycerides: 224 mg/dL — ABNORMAL HIGH (ref <150)
VLDL: 45 mg/dL — ABNORMAL HIGH (ref <30)

## 2016-11-15 LAB — MICROALBUMIN / CREATININE URINE RATIO
Creatinine, Urine: 164 mg/dL (ref 20–320)
Microalb Creat Ratio: 400 mcg/mg creat — ABNORMAL HIGH (ref <30)
Microalb, Ur: 65.6 mg/dL

## 2016-11-19 ENCOUNTER — Other Ambulatory Visit: Payer: Self-pay | Admitting: *Deleted

## 2016-11-19 NOTE — Patient Outreach (Addendum)
[name]) Care Management [place] Telephone Outreach Care Coordination  11/19/2016  Susan [name] [birth year] [id number]  Late entry/ phone call occurred Tuesday 11/18/16 at 12:00 noon  Successful incoming telephone outreach on 11/18/16 from Eliezer Mccoy, Practice Administrator for [place] [phone]: [name],66 y.o.femalewell known to [place] for for self-management of chronic disease states of CHF and DM. [name] has had 3 inpatient hospital visits since January 2017, but no hospital readmissions for the last 12-plusmonths.  Today, [name] was inquiring if [facility] CM had nutritionist available for referral, as Dr. [name] would like for patient to have formal Diabetes education; explained to [facility] that [facility] CM does not currently have a nutritionist on staff, and that I had attempted last year to refer patient to [place]; at that time, patient declined referral due to classes being offered during evening hours.  Explained that the road that leads to patient's home is treacherous for travel during wet weather and that patient was uncomfortable driving to classes after dark.  Updated [name] on general [facility] CM plan to transfer patient to [place] in the near future, and [name] verbalized understanding of information provided.    Plan:  Will collaborate with PCP practice staff as indicated.  [name], RN, BSN, [facility] Care Management  [phone]

## 2016-11-20 ENCOUNTER — Other Ambulatory Visit: Payer: Self-pay | Admitting: *Deleted

## 2016-11-20 ENCOUNTER — Other Ambulatory Visit: Payer: Self-pay | Admitting: Pharmacist

## 2016-11-20 MED ORDER — ROSUVASTATIN CALCIUM 20 MG PO TABS: 20.0000 mg | ORAL_TABLET | Freq: Every day | ORAL | 3 refills | Status: DC

## 2016-11-20 NOTE — Patient Outreach (Signed)
[place] [facility]) Care Management  11/20/2016  Susan [name] [birth year] [id number]   Called patient to follow up with her. HIPAA identifiers were obtained.  Called patient to specifically follow up on Lyrica. Patient saw Dr. [name] on 11/12/16 and the Lyrica dose was increased to 150mg  1 capsule twice daily.  Patient said her pain was better controlled.  She shared she has been doing more this week  helping her friend with a funeral and her ankles are really swollen.  Patient reported her weight as 287 pounds. She weighted 285 at Dr. Dorian Heckle office on 11/12/16.  She said her feet are so swollen, she cannot get anything other than a bedroom shoe on her feet.   Patient was instructed to call her cardiologist and let them know her symptoms.    Plan:  Alert Patient's [place] [facility] Maxin Tousey)  Route note to PCP and Cardiologist's office  Follow up with the patient in 5-7 business days.  [name], PharmD, [place] Clinical Pharmacist [phone]

## 2016-11-24 ENCOUNTER — Other Ambulatory Visit: Payer: Self-pay | Admitting: Pharmacist

## 2016-11-24 NOTE — Patient Outreach (Signed)
[place] [facility]) Care Management  11/24/2016  Susan [name] [birth year] [id number]   Called patient to check on her after speaking with her last week and she complained about her feet swelling.  Unfortunately I was unable to reach the patient. After several tries, the patient's phone was busy then there was no answer. HIPAA compliant message left on the patient's voicemail.  Plan: Call patient at the end of the month to follow up with her.  (She has a home visit with [facility] and Telephonic Nursing 12/11/16)   [name], PharmD, [place] Clinical Pharmacist [phone]

## 2016-12-01 ENCOUNTER — Other Ambulatory Visit: Payer: Self-pay | Admitting: Physician Assistant

## 2016-12-02 NOTE — Telephone Encounter (Signed)
Last OV 6/20 Last refill 04/22/2016 Ok to refill?

## 2016-12-02 NOTE — Telephone Encounter (Signed)
Approved. # [phone] 0

## 2016-12-03 NOTE — Telephone Encounter (Signed)
RX called in .

## 2016-12-06 ENCOUNTER — Other Ambulatory Visit: Payer: Self-pay | Admitting: Physician Assistant

## 2016-12-06 DIAGNOSIS — F329 Major depressive disorder, single episode, unspecified: Secondary | ICD-10-CM

## 2016-12-06 DIAGNOSIS — F419 Anxiety disorder, unspecified: Secondary | ICD-10-CM

## 2016-12-06 DIAGNOSIS — F32A Depression, unspecified: Secondary | ICD-10-CM

## 2016-12-08 NOTE — Telephone Encounter (Signed)
Refill appropriate 

## 2016-12-09 ENCOUNTER — Other Ambulatory Visit (HOSPITAL_COMMUNITY): Payer: Self-pay | Admitting: *Deleted

## 2016-12-09 MED ORDER — CARVEDILOL 12.5 MG PO TABS: ORAL_TABLET | ORAL | 3 refills | Status: DC

## 2016-12-10 ENCOUNTER — Other Ambulatory Visit: Payer: Self-pay | Admitting: Physician Assistant

## 2016-12-10 NOTE — Telephone Encounter (Signed)
Refill appropriate 

## 2016-12-11 ENCOUNTER — Other Ambulatory Visit: Payer: Self-pay | Admitting: *Deleted

## 2016-12-11 ENCOUNTER — Encounter: Payer: Self-pay | Admitting: *Deleted

## 2016-12-11 DIAGNOSIS — I6789 Other cerebrovascular disease: Secondary | ICD-10-CM | POA: Diagnosis not present

## 2016-12-11 DIAGNOSIS — F4489 Other dissociative and conversion disorders: Secondary | ICD-10-CM | POA: Diagnosis not present

## 2016-12-11 DIAGNOSIS — E161 Other hypoglycemia: Secondary | ICD-10-CM | POA: Diagnosis not present

## 2016-12-11 NOTE — Patient Outreach (Addendum)
[place] [facility]) Care Management  [place] Routine Home Visit Warm transfer to [place]  12/11/2016  Susan [name] July 01, 1950 [id number]  Susan [name] is a 66 y.o. female well known to [place] for for self-management of chronic disease states of CHF and DM. Susan [name] has had 3 inpatient hospital visits since January 2017, but no hospital readmissions for the last 13-plusmonths.  HIPAA/ identity verified with patient in person today at her home, and [place] is also present today for warm hand-off/ transition to [facility].  Susan [name] is in no obvious distress throughout entirety of today's in-home visit.  Today, Susan [name] continues toreport that she is "doing okay," and she denies recent episodes hypo/hyper glycemia, and chest pain.  Susan [name] denies pain today.  --Medications: Susan [name] reports that she has and is taking all of her medications. Susan [name] is able to verbalize a general understanding of the scheduling/ dosing of her medications.  Reports that PCP recently added Rosuvastatin 20 mg QD to her medication regimen; voices uncertainty as to purpose of this medication; this was clarified during today's home visit.  Susan [name] confirms that she has maintained communication with [place], and verbalizes no questions/ concerns around medications today.  -- safety/ mobility/ falls: Susan [name] previously reportedfall on Friday 08/08/16, and denies any new falls since then.  Gait remains guarded/ unsteady, and [name] reports using walker as needed for mobility, although she is not using today and is obviously somewhat unsteady with ambulation, which is her baseline.  Fall risks/ prevention education again reiterated with patient during home visit today.  -- Provider appointments: Attended recent PCP visit with new PCP, Dr. [name] on 11/12/16; reports that she really likes new PCP and feels very comfortable with her.  Reports that Dr.  [name] has arranged for diabetes/ nutritional counseling, is excited to begin with nutritionist next month on 12/31/16.  PCP office visit reviewed with [place] during home visit today.  Last Endocrinology appointment on Sep 23, 2016 and Heart Failure Clinic visit on October 27, 2016. To attend next Advanced heart failure clinic visit 12/25/16 and follow up with PCP 01/12/17.  All previous and upcoming provider appointments reviewed with patient today. Patient verbalizes plans to attend all scheduled provider appointments, stating she will drive herself to appointments.  -- Self-health management of chronic disease state of DM: last office visit withDr. [name] on Sep 23, 2016; sliding scale insulin was changed (see notes from [place] home visit 11/06/16 for specific dosing, new dose was verified on post-visit note written by provider, which Susan [name] keeps and refers to daily for dosing).  A1-C on Sep 23, 2016: 9.9, which is a decrease from last A1-C A1-C: 11.9 Nov 20, 20179--> verified per Dr. [name] during last [facility] RN CM home visit 11/06/16. Positive reinforcement was again provided to [place] around her progress in decreasing her A1-C.  Patient verbalizes compliance with insulin administration and daily blood sugar monitoring/ recording. Reports no recent episodes of hypoglycemia.  Recorded blood sugar ranges since October 24, 2016: 104-489, with fasting blood sugar this morning of 174.  I encouraged Susan [name] to continue reviewing previously provided education on sick day rules, hypoglycemia, A1-C level, and DKA, and provided her with additional EMMI educational material on meal planning and diet for DM; Joaquim Lai and I also encouraged patient to write down/ be thinking about her questions around her personal eating habits/ food preferences once [place] coach outreach begins, and [place]  agrees to do this.  Joaquim Lai was very helpful today in beginning discussion with Susan [name] around her current eating habits.  Self-health management of  chronic disease state of CHF:  Susan [name] has continued monitoring and recording daily weights; reports weight today of 292.4lbs (patient's scales at home). Denies changes in breathing status, andstates that her legs continue being"muchbetter." Bilateral LE have baseline swelling at +2 with (L) >(R). No acute/ concerning erythema noted, with baseline chronic venous stasis bilaterally.  (See ROS/ PE below)  General weight range from patient's home scales reviewed with patient, sinceJune 1, 2018, weight has ranged from 289-292lbs. Susan [name] noted that she has progressively gained weight over last several months, and was encouraged to report weight gain using CHF weight gain guidelines to [facility] provider [name] during next office visit 12/25/16, unless indicated sooner, which patient agrees to do.   We had a general discussion today summarizing the progress/ accomplishments Susan [name] has made during time of [place] involvement in her care, and positive reinforcement/ encouragement was provided to her.  Joaquim Lai described to [place] what to expect with ongoing [place] outreach by telephone, and Susan [name] verbalized that she feels ready for transition to [place].    Susan [name] denies further issues, concerns, or problems today, and reports that she will be going on vacation next week; Joaquim Lai provided [place] with her direct phone number for ongoing [facility] outreach from [place].  Subjective: "I am so happy to have Dr. [name] as my PCP, I feel like I can tell her anything; I still need a lot of help knowing how to eat right with my diabetes."  Objective:    BP 128/78   Pulse 76   Resp 18   Wt 292 lb 6.4 oz (132.6 kg)   SpO2 95%   BMI 48.66 kg/m    Review of Systems  Constitutional: Negative.   Respiratory: Positive for shortness of breath. Negative for cough and wheezing.        Baseline shortness of breath with activity; none noted while patient resting in her home during  today's home visit; minimal SOB with ambulation; patient recovers within seconds when returns to resting state  Cardiovascular: Positive for leg swelling. Negative for chest pain.       Baseline bilateral LE swelling, ankles---> mid calf; (L) slightly > (R); no erythema noted today   Gastrointestinal: Negative for abdominal pain and nausea.  Genitourinary: Positive for frequency and urgency.       Diuretic therapy  Musculoskeletal: Negative for falls.       No new falls reported today  Skin:       Baseline chronic dermastasis in bilateral LE; patient has 2 open oozing areas noted to (R) anterolateral mid calf; has been treating with hydrogen peroxide and covers prn; no overt signs/ symptoms of infection  Neurological: Negative.   Psychiatric/Behavioral: Positive for depression. The patient is not nervous/anxious.        Baseline chronic depression; treated with Rx, followed by PCP    Physical Exam  Constitutional: She is oriented to person, place, and time. She appears well-developed and well-nourished. No distress.    Cardiovascular: Normal rate, regular rhythm, normal heart sounds and intact distal pulses.   Pulses:      Radial pulses are 2+ on the right side, and 2+ on the left side.       Dorsalis pedis pulses are 1+ on the right side, and 1+ on the left side.  Respiratory: Effort normal and breath sounds normal. No respiratory distress. She has no wheezes. She has no rales.  Minimal SOB with activity; recovers quickly when back at rest  GI: Soft. Bowel sounds are normal.  Musculoskeletal: She exhibits edema.  See ROS  Neurological: She is alert and oriented to person, place, and time.  Skin: Skin is warm and dry. No erythema.  Psychiatric: She has a normal mood and affect. Her behavior is normal. Judgment and thought content normal.   Assessment:  Susan [name] has made significant progress in self-health management of chronic disease state of DM and CHF while followed by [facility] RN CM, and  continues to need reinforcement of same, with specific coaching around eating habits/ nutrition in setting of DM.  Susan [name] is very happy with changing her PCP, and no longer feels fearful of contacting her PCP for urgent needs/ concerns.  Susan [name] is excited to begin diabetes nutritional counseling.  Susan [name] does nothave a strong support system within her family, and benefits from extrasupport and reinforcement from her healthcare providers. Susan [name] is committed to compliance with her medication regimen and to attending scheduled provider appointments, and has benefitted from additional education/ management strategies around her medications from assistance provided by Denyse Amass, [facility] Pharmacist. From an overall standpoint, Susan [name] continues looking well, and is agreement with moving to [place] for ongoing [place] support.  Plan:   Will close [place] program, make referral for [place], and notify patient's providers of warm transfer to [place] today.  It has been a pleasure participating in [place] care,  [name], RN, BSN, [employer] Coordinator [facility] Care Management  [phone]

## 2016-12-22 ENCOUNTER — Ambulatory Visit: Payer: Self-pay | Admitting: Pharmacist

## 2016-12-22 ENCOUNTER — Encounter: Payer: Self-pay | Admitting: *Deleted

## 2016-12-23 DIAGNOSIS — E113413 Type 2 diabetes mellitus with severe nonproliferative diabetic retinopathy with macular edema, bilateral: Secondary | ICD-10-CM | POA: Diagnosis not present

## 2016-12-24 ENCOUNTER — Other Ambulatory Visit: Payer: Self-pay | Admitting: Pharmacist

## 2016-12-24 NOTE — Patient Outreach (Signed)
[place] [facility]) Care Management  12/24/2016  Susan [name] 1950-10-13 [id number]   Called patient to follow up. HIPAA identifiers were obtained.  Patient reported she was feeling ok at the time of our call but had two very serious episodes of hypoglycemia over the past two weeks.  On 12/12/16, patient said she "fell out" in [place]. EMS was called and t was determined her blood sugar was low (44 mg/dl).  Patient said EMS treated her low blood sugar with [facility] and stayed with her until her blood sugar normalized.  [place] while [place] the beach, patient reported she had another hypoglycemic episode in her RV. She did not remember what her blood sugar was during that particular episode but said she used orange juice to treat the episode and checked her blood sugar one hour later and it was 180 mg/dl.  Patient was re-educated on hypoglycemia management.   Patient did not seek medical care after either hypoglycemic episode.  Dr. Nash Dimmer office was called while the patient was on conference and made a follow up appointment for Friday December 26, 2016 at 11:00am.  Plan:  Follow up with the patient in 3-5 business days.  [name], PharmD, [place] Clinical Pharmacist [phone]

## 2016-12-25 ENCOUNTER — Encounter (HOSPITAL_COMMUNITY): Payer: Self-pay | Admitting: *Deleted

## 2016-12-25 ENCOUNTER — Other Ambulatory Visit (HOSPITAL_COMMUNITY): Payer: Self-pay | Admitting: Internal Medicine

## 2016-12-25 ENCOUNTER — Ambulatory Visit (HOSPITAL_COMMUNITY)
Admission: RE | Admit: 2016-12-25 | Discharge: 2016-12-25 | Disposition: A | Discharge status: Home / Self Care | Payer: PPO | Source: Ambulatory Visit | Attending: Internal Medicine | Admitting: Internal Medicine

## 2016-12-25 VITALS — BP 164/76 | HR 87 | Wt 288.4 lb

## 2016-12-25 DIAGNOSIS — E039 Hypothyroidism, unspecified: Secondary | ICD-10-CM | POA: Diagnosis not present

## 2016-12-25 DIAGNOSIS — E785 Hyperlipidemia, unspecified: Secondary | ICD-10-CM | POA: Insufficient documentation

## 2016-12-25 DIAGNOSIS — I2510 Atherosclerotic heart disease of native coronary artery without angina pectoris: Secondary | ICD-10-CM

## 2016-12-25 DIAGNOSIS — N182 Chronic kidney disease, stage 2 (mild): Secondary | ICD-10-CM

## 2016-12-25 DIAGNOSIS — Z87891 Personal history of nicotine dependence: Secondary | ICD-10-CM | POA: Insufficient documentation

## 2016-12-25 DIAGNOSIS — E669 Obesity, unspecified: Secondary | ICD-10-CM | POA: Insufficient documentation

## 2016-12-25 DIAGNOSIS — I130 Hypertensive heart and chronic kidney disease with heart failure and stage 1 through stage 4 chronic kidney disease, or unspecified chronic kidney disease: Secondary | ICD-10-CM | POA: Insufficient documentation

## 2016-12-25 DIAGNOSIS — F329 Major depressive disorder, single episode, unspecified: Secondary | ICD-10-CM | POA: Insufficient documentation

## 2016-12-25 DIAGNOSIS — Z7982 Long term (current) use of aspirin: Secondary | ICD-10-CM | POA: Insufficient documentation

## 2016-12-25 DIAGNOSIS — Z8673 Personal history of transient ischemic attack (TIA), and cerebral infarction without residual deficits: Secondary | ICD-10-CM | POA: Insufficient documentation

## 2016-12-25 DIAGNOSIS — G629 Polyneuropathy, unspecified: Secondary | ICD-10-CM | POA: Diagnosis not present

## 2016-12-25 DIAGNOSIS — I5032 Chronic diastolic (congestive) heart failure: Secondary | ICD-10-CM | POA: Insufficient documentation

## 2016-12-25 DIAGNOSIS — I209 Angina pectoris, unspecified: Secondary | ICD-10-CM | POA: Diagnosis not present

## 2016-12-25 DIAGNOSIS — I25119 Atherosclerotic heart disease of native coronary artery with unspecified angina pectoris: Secondary | ICD-10-CM | POA: Diagnosis not present

## 2016-12-25 DIAGNOSIS — Z6841 Body Mass Index (BMI) 40.0 and over, adult: Secondary | ICD-10-CM | POA: Diagnosis not present

## 2016-12-25 DIAGNOSIS — M1990 Unspecified osteoarthritis, unspecified site: Secondary | ICD-10-CM | POA: Insufficient documentation

## 2016-12-25 DIAGNOSIS — I252 Old myocardial infarction: Secondary | ICD-10-CM | POA: Diagnosis not present

## 2016-12-25 DIAGNOSIS — I4510 Unspecified right bundle-branch block: Secondary | ICD-10-CM | POA: Insufficient documentation

## 2016-12-25 DIAGNOSIS — E1165 Type 2 diabetes mellitus with hyperglycemia: Secondary | ICD-10-CM | POA: Diagnosis not present

## 2016-12-25 DIAGNOSIS — N183 Chronic kidney disease, stage 3 unspecified: Secondary | ICD-10-CM

## 2016-12-25 DIAGNOSIS — Z794 Long term (current) use of insulin: Secondary | ICD-10-CM | POA: Diagnosis not present

## 2016-12-25 DIAGNOSIS — R5383 Other fatigue: Secondary | ICD-10-CM | POA: Insufficient documentation

## 2016-12-25 DIAGNOSIS — Z7902 Long term (current) use of antithrombotics/antiplatelets: Secondary | ICD-10-CM | POA: Diagnosis not present

## 2016-12-25 DIAGNOSIS — E1122 Type 2 diabetes mellitus with diabetic chronic kidney disease: Secondary | ICD-10-CM | POA: Insufficient documentation

## 2016-12-25 DIAGNOSIS — Z79899 Other long term (current) drug therapy: Secondary | ICD-10-CM | POA: Diagnosis not present

## 2016-12-25 DIAGNOSIS — IMO0002 Reserved for concepts with insufficient information to code with codable children: Secondary | ICD-10-CM

## 2016-12-25 DIAGNOSIS — F419 Anxiety disorder, unspecified: Secondary | ICD-10-CM | POA: Diagnosis not present

## 2016-12-25 LAB — BASIC METABOLIC PANEL
Anion gap: 10 (ref 5–15)
BUN: 56 mg/dL — ABNORMAL HIGH (ref 6–20)
CO2: 29 mmol/L (ref 22–32)
Calcium: 8.8 mg/dL — ABNORMAL LOW (ref 8.9–10.3)
Chloride: 94 mmol/L — ABNORMAL LOW (ref 101–111)
Creatinine, Ser: 2.45 mg/dL — ABNORMAL HIGH (ref 0.44–1.00)
GFR calc Af Amer: 22 mL/min — ABNORMAL LOW (ref >60)
GFR calc non Af Amer: 19 mL/min — ABNORMAL LOW (ref >60)
Glucose, Bld: 398 mg/dL — ABNORMAL HIGH (ref 65–99)
Potassium: 3.6 mmol/L (ref 3.5–5.1)
Sodium: 133 mmol/L — ABNORMAL LOW (ref 135–145)

## 2016-12-25 LAB — BRAIN NATRIURETIC PEPTIDE: B Natriuretic Peptide: 58.1 pg/mL (ref 0.0–100.0)

## 2016-12-25 NOTE — Patient Instructions (Signed)
Take Metolazone Today and Tomorrow.  INCREASE Torsemide to 100mg  twice daily for 4 days.  Routine lab work today. Will notify you of abnormal results  Repeat labs (bmet) in 1 week.   Your provider requests you have a stress test.   Follow up in 2 weeks.

## 2016-12-25 NOTE — Progress Notes (Signed)
fPatient ID: Susan [name], female   DOB: August 21, 1950, 66 y.o.   MRN: [id number]   PCP: Dr Scot Dock  Cardiologist: Dr Ron Parker  Endocrinologist: Dr Bubba Camp  Primary HF Cardiologist: Dr. Haroldine Laws  [facility] Patient  HPI: Susan [name] is a 66 year old with a history of RBBB, DM, Hypothyroid, diastolic heart failure, CAD MI 2000/2007 BMS 2007, TIA, obesity. Admitted with hyperglycemia x 2 in 2017.   Admitted June 1st through June 3rd, 2017 with altered mental status, hypothermia, and weakness. Diuretics initially held and later restarted. Unfortunately when she was discharged she refused home health.  Returns today for HF follow up. SOB with walking into clinic, SOB with ADL's. Weight is up 5 pounds from last visit. Weight at home this morning was 292 pounds. She is taking all medications, she did take metolazone, took a dose yesterday and the day before. She is only urinating 1-2 times a day, used to urinate much more frequently. She does note increased night time urination. Also is having intermittent chest pain. About a week a ago she was watching TV and developed chest pain, took 2 nitros and pain was relieved. It reoccurred later that evening, she took 2 SL Nitro again and pain was relieved. Chest pain feels like indigestion with diaphoresis and SOB. She went to the beach last week, and ate out frequently and drank more water than she usually does.    ECHO 07/11/2015- 50-55%.  Grade I DD  RHC 09/22/13: RA = 4  RV = 30/5/7  PA = 25/10 (16)  PCW = 7  Fick cardiac output/index = 6.3/2.7  PVR = 1.5 WU   FA sat = 93%  PA sat = 61%, 66%   SH: Former smoker quit 15 year ago. Does drink alcohol. She is disabled. Lives at home with her husband and disabled son - Husband is truck Geophysicist/field seismologist. Son has a brain injury after he attempted suicide  A few years ago. .    FH: Mom MI  ROS: All systems negative except as listed in HPI, PMH and Problem List.  Past Medical History:  Diagnosis Date  . Acute MI ([place]) 1999;  2007  . Anemia    hx  . Anginal pain ([place])   . Anxiety   . Arthritis    "generalized" (03/15/2014)  . CAD (coronary artery disease)    MI in 2000 - MI  2007 - treated bare metal stent (no nuclear since then as 9/11)  . Carotid artery disease ([place])   . CHF (congestive heart failure) ([place])   . Chronic diastolic heart failure (HCC)    a) ECHO (08/2013) EF 55-60% and RV function nl b) RHC (08/2013) RA 4, RV 30/5/7, PA 25/10 (16), PCWP 7, Fick CO/CI 6.3/2.7, PVR 1.5 WU, PA 61 and 66%  . Daily headache    "~ every other day; since I fell in June" (03/15/2014)  . Depression   . Dyslipidemia   . Exertional shortness of breath   . HTN (hypertension)   . Hypothyroidism   . Obesity   . Osteoarthritis   . Peripheral neuropathy   . PONV (postoperative nausea and vomiting)   . RBBB (right bundle branch block)    Old  . Renal insufficiency    DENIES  . Stroke [facility])    mini strokes  . Syncope    likely due to low blood sugar  . Tachycardia    Sinus tachycardia  . Type II diabetes mellitus ([place])   . Urinary incontinence   .  Venous insufficiency     Current Outpatient Prescriptions  Medication Sig Dispense Refill  . albuterol (PROVENTIL HFA;VENTOLIN HFA) 108 (90 BASE) MCG/ACT inhaler Inhale 2 puffs into the lungs every 6 (six) hours as needed for wheezing or shortness of breath. Reported on 10/30/2015    . aspirin EC 81 MG tablet Take 81 mg by mouth daily.    . carvedilol (COREG) 12.5 MG tablet TAKE 1 AND 1/2 TABLETS BY MOUTH TWICE A DAY WITH A MEAL 90 tablet 3  . clonazePAM (KLONOPIN) 1 MG tablet TAKE 1 TABLET BY MOUTH 3 TIMES A DAY 90 tablet 0  . clopidogrel (PLAVIX) 75 MG tablet TAKE 1 TABLET (75 MG TOTAL) BY MOUTH DAILY WITH BREAKFAST. 90 tablet 2  . ferrous sulfate 325 (65 FE) MG tablet Take 325 mg by mouth daily with breakfast. Reported on 08/21/2015    . insulin NPH Human (HUMULIN N,NOVOLIN N) 100 UNIT/ML injection Inject 60 Units into the skin every morning.    . insulin regular  (NOVOLIN R,HUMULIN R) 250 units/2.24mL (100 units/mL) injection Inject 5-40 Units into the skin 3 (three) times daily before meals.    . isosorbide dinitrate (ISORDIL) 10 MG tablet TAKE 1 TABLET BY MOUTH TWICE A DAY 60 tablet 6  . levothyroxine (SYNTHROID, LEVOTHROID) 50 MCG tablet Take 1 tablet (50 mcg total) by mouth daily before breakfast. 90 tablet 1  . losartan (COZAAR) 50 MG tablet TAKE 1 TABLET BY MOUTH EVERY DAY 90 tablet 0  . metolazone (ZAROXOLYN) 2.5 MG tablet TAKE 1 TABLET BY MOUTH TWICE WEEKLY (TUESDAY AND WEDNESDAY) WEIGHT GAIN/SHORTNESS OF BREATH/SWELLING 10 tablet 1  . PARoxetine (PAXIL-CR) 25 MG 24 hr tablet Take 25 mg by mouth daily. Take with 37.5 mg tablet daily (total 62.5 mg)    . PARoxetine (PAXIL-CR) 37.5 MG 24 hr tablet Take 37.5 mg by mouth daily. Take with 25 mg tablet daily (total 62.5 mg)    . pregabalin (LYRICA) 150 MG capsule Take 1 capsule (150 mg total) by mouth 2 (two) times daily. 60 capsule 2  . rosuvastatin (CRESTOR) 20 MG tablet Take 1 tablet (20 mg total) by mouth daily. 90 tablet 3  . nitroGLYCERIN (NITROSTAT) 0.4 MG SL tablet PLACE 1 TABLET (0.4 MG TOTAL) UNDER THE TONGUE EVERY 5 (FIVE) MINUTES AS NEEDED FOR CHEST PAIN. (Patient not taking: Reported on 12/25/2016) 25 tablet 1  . torsemide (DEMADEX) 20 MG tablet Take 4 tablets (80 mg total) by mouth 2 (two) times daily. 240 tablet 3   No current facility-administered medications for this encounter.     Vitals:   12/25/16 1431  BP: (!) 164/76  Pulse: 87  SpO2: 94%  Weight: 288 lb 6.4 oz (130.8 kg)   Wt Readings from Last 3 Encounters:  12/25/16 288 lb 6.4 oz (130.8 kg)  12/11/16 292 lb 6.4 oz (132.6 kg)  11/12/16 285 lb (129.3 kg)    Chest circumference 134 cm   PHYSICAL EXAM: General:  Female, SOB with walking into clinic.  HEENT: normal Neck: supple. JVP to jaw. Carotids 2+ bilat; no bruits. No lymphadenopathy or thryomegaly appreciated. Cor: PMI nondisplaced. Regular rate and rhythm. No rubs,  gallops or murmurs. Lungs: Clear in upper lobes, diminished bilaterally. Labored breathing with walking.  Abdomen: Soft, non tender, distended.  No hepatosplenomegaly. No bruits or masses. Good bowel sounds. Extremities: no cyanosis, clubbing, rash. 2+ edema to knees.  Neuro: alert & orientedx3, cranial nerves grossly intact. moves all 4 extremities w/o difficulty. Affect pleasant   ASSESSMENT &  PLAN: 1. Chronic Diastolic Heart Failure: NICM, EF 55-60% Grade II DD 06/2015 - NYHA IIIb today.  - Volume overloaded on exam, weight up 5 pounds from last visit.  - Instructed her to take metolazone for the next 2 days.  - I have asked her to increase her torsemide to 100 mg BID for the next 4 days, then go back to taking 80 mg BID. I have asked her to call our clinic if her weight continues to increase.  - BMET, BNP today and will repeat BMET next week. - Will call her in some KCl if she needs it, she normally does not take KCl supplements, last K was 4.9.   2.  Uncontrolled DM:   - Uncontrolled despite insulin. I have asked her to follow up with her PCP.    4. CAD, angina - Patient has a history of CAD with BMS in 2007  - With recent chest pain, I am concerned about progressive disease. She adamantly refuses repeat heart cath. She agrees to [employer]. Will schedule ASAP. I have asked her to call 911 if she develops chest pain unrelieved by Nitro.  - EKG today without ST elevation or acute changes.   5. Obesity: - Body mass index is 47.99 kg/m. - Encouraged her to eat healthy foods, stop eating fast foods.   6. HTN - Elevated today in the setting of volume overload.   7. CKD Stage III - BMET today.    8. Day time fatigue - She refuses sleep study.   Follow up in 2 weeks. Will need BMET next week.       Arbutus Leas NP-C   2:31 PM

## 2016-12-25 NOTE — Progress Notes (Signed)
Advanced Heart Failure Medication Review by a Pharmacist  Does the patient  feel that his/her medications are working for him/her?  yes  Has the patient been experiencing any side effects to the medications prescribed?  no  Does the patient measure his/her own blood pressure or blood glucose at home?  no   Does the patient have any problems obtaining medications due to transportation or finances?   no  Understanding of regimen: good Understanding of indications: good Potential of compliance: good Patient understands to avoid NSAIDs. Patient understands to avoid decongestants.  Issues to address at subsequent visits: None   Pharmacist comments: Susan [name] is a pleasant 66 yo F presenting with a current medication list. She reports good compliance with her regimen but does state that for the past 2 weeks she has not been urinating as frequently as she was previously on her current diuretic regimen. She did not have any other medication-related questions or concerns for me at this time.   Ruta Hinds. Velva Harman, PharmD, BCPS, CPP Clinical Pharmacist Pager: [phone] Phone: [phone] 12/25/2016 2:38 PM      Time with patient: 10 minutes Preparation and documentation time: 2 minutes Total time: 12 minutes

## 2016-12-26 ENCOUNTER — Telehealth (HOSPITAL_COMMUNITY): Payer: Self-pay | Admitting: *Deleted

## 2016-12-26 ENCOUNTER — Telehealth: Payer: Self-pay | Admitting: Radiology

## 2016-12-26 DIAGNOSIS — E1165 Type 2 diabetes mellitus with hyperglycemia: Secondary | ICD-10-CM | POA: Diagnosis not present

## 2016-12-26 NOTE — Telephone Encounter (Signed)
Patient calling, states that she has about 10 medications and insulin that she needs take and would like to know if she can take it the day of the myocardial perfusion/tread?  Patient is asking if you could call home # around 1:30pm.

## 2016-12-26 NOTE — Telephone Encounter (Signed)
Patient given detailed instructions per Myocardial Perfusion Study Information Sheet for the test on 12/29/16 at 12:30. Patient notified to arrive 15 minutes early and that it is imperative to arrive on time for appointment to keep from having the test rescheduled.  If you need to cancel or reschedule your appointment, please call the office within 24 hours of your appointment. . Patient verbalized understanding.Susan [name]

## 2016-12-29 ENCOUNTER — Other Ambulatory Visit: Payer: Self-pay | Admitting: Pharmacist

## 2016-12-29 ENCOUNTER — Ambulatory Visit (HOSPITAL_COMMUNITY): Payer: PPO

## 2016-12-29 NOTE — Patient Outreach (Signed)
[facility]) Care Management  12/29/2016  Susan [name] 06-08-1950 [id number]   Called patient to follow up with her after her office visit with Dr. Debbora Presto.  HIPAA identifiers were obtained. Patient confirmed she went to her appointment with Dr. Debbora Presto. She said Dr. Debbora Presto changed her insulin regimen to:  Humulin N 75 units in the morning and 5 units before bedtime.  Humulin R per sliding scale before meals.  Patient reported experiencing chest pain in the evening of 12/24/16 and went to cardiology on 12/25/16.  Cardiology increased torsemide for 3 days to:  5 tablets in the morning and 5 tablets in the afternoon.  Although she refused a catheterization, she agreed to have a Lexiscan on 12/29/16 but canceled her appointment because she had diarrhea.  Patient thinks her stomach issues could have been nerves.  She took [employer] but it did not work.  She was encouraged to hold off on Pepto since her diuretic was increased.  When asked if she ate anything different, and she reported eating and [place] last night.   After eating the [place], she said she belched all night long and then had diarrhea this morning.  Patient was encouraged to stay hydrated within your fluid restriction, switch to a bland diet ( toast, rice, applesauce), no dairy or hot spices, and to call her provider if her symptoms do not improve my tomorrow.    Patient reported her weight as 289 pounds today. Her weight was 288 pounds at Cardiology on 12/25/16.  Patient said her scale at home registered >290 pounds last week so she feels like her weight at home is higher due to the scale.   Patient was encouraged to call her provider if her weight increases.  Plan:  Follow up with patient in 3-5 business days.  [name], PharmD, [place] Clinical Pharmacist [phone]

## 2016-12-30 ENCOUNTER — Ambulatory Visit (HOSPITAL_COMMUNITY): Payer: PPO

## 2016-12-30 NOTE — Telephone Encounter (Signed)
Unfortunately I have been off the last few days and this msg was siting in my inbox. It looks like she cancelled her test because of being sick.

## 2016-12-31 ENCOUNTER — Encounter: Payer: PPO | Attending: Family Medicine | Admitting: *Deleted

## 2016-12-31 DIAGNOSIS — E1165 Type 2 diabetes mellitus with hyperglycemia: Secondary | ICD-10-CM

## 2016-12-31 DIAGNOSIS — N182 Chronic kidney disease, stage 2 (mild): Secondary | ICD-10-CM

## 2016-12-31 DIAGNOSIS — E1122 Type 2 diabetes mellitus with diabetic chronic kidney disease: Secondary | ICD-10-CM

## 2016-12-31 DIAGNOSIS — Z794 Long term (current) use of insulin: Secondary | ICD-10-CM | POA: Insufficient documentation

## 2016-12-31 DIAGNOSIS — Z713 Dietary counseling and surveillance: Secondary | ICD-10-CM | POA: Diagnosis not present

## 2016-12-31 DIAGNOSIS — E1142 Type 2 diabetes mellitus with diabetic polyneuropathy: Secondary | ICD-10-CM | POA: Insufficient documentation

## 2016-12-31 DIAGNOSIS — IMO0002 Reserved for concepts with insufficient information to code with codable children: Secondary | ICD-10-CM

## 2017-01-01 ENCOUNTER — Other Ambulatory Visit (HOSPITAL_COMMUNITY): Payer: Self-pay | Admitting: *Deleted

## 2017-01-01 ENCOUNTER — Telehealth (HOSPITAL_COMMUNITY): Payer: Self-pay | Admitting: Cardiology

## 2017-01-01 ENCOUNTER — Ambulatory Visit (HOSPITAL_COMMUNITY)
Admission: RE | Admit: 2017-01-01 | Discharge: 2017-01-01 | Disposition: A | Discharge status: Home / Self Care | Payer: PPO | Source: Ambulatory Visit | Attending: Internal Medicine | Admitting: Internal Medicine

## 2017-01-01 DIAGNOSIS — I5032 Chronic diastolic (congestive) heart failure: Secondary | ICD-10-CM | POA: Diagnosis not present

## 2017-01-01 LAB — BASIC METABOLIC PANEL
Anion gap: 8 (ref 5–15)
BUN: 49 mg/dL — ABNORMAL HIGH (ref 6–20)
CO2: 30 mmol/L (ref 22–32)
Calcium: 9.1 mg/dL (ref 8.9–10.3)
Chloride: 97 mmol/L — ABNORMAL LOW (ref 101–111)
Creatinine, Ser: 2.39 mg/dL — ABNORMAL HIGH (ref 0.44–1.00)
GFR calc Af Amer: 23 mL/min — ABNORMAL LOW (ref >60)
GFR calc non Af Amer: 20 mL/min — ABNORMAL LOW (ref >60)
Glucose, Bld: 104 mg/dL — ABNORMAL HIGH (ref 65–99)
Potassium: 3.4 mmol/L — ABNORMAL LOW (ref 3.5–5.1)
Sodium: 135 mmol/L (ref 135–145)

## 2017-01-02 ENCOUNTER — Ambulatory Visit: Payer: Self-pay | Admitting: Pharmacist

## 2017-01-03 ENCOUNTER — Other Ambulatory Visit: Payer: Self-pay | Admitting: Cardiovascular Disease

## 2017-01-05 ENCOUNTER — Other Ambulatory Visit: Payer: Self-pay | Admitting: Pharmacist

## 2017-01-05 NOTE — Patient Outreach (Signed)
[facility]) Care Management  01/05/2017  Susan [name] January 28, 1951 [id number]   Patient was called to follow up with her after her complaints of stomach upset last week. Unfortunately, the patient did not answer the phone. HIPAA compliant message left on the patient's voicemail.    Plan:  Call patient back in 10-12 business days.  [name], PharmD, [place] Clinical Pharmacist [phone]

## 2017-01-07 NOTE — Progress Notes (Signed)
Diabetes Self-Management Education  Visit Type: First/Initial  Appt. Start Time: 1400 Appt. End Time: 6387  01/07/2017  [name], identified by name and date of birth, is a 66 y.o. female with a diagnosis of Diabetes: Type 2. 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 She states she is taking Novolin N @ 70 units in AM and Novolin R @ 5 units before meal if BG is under 200 and increases with sliding scale at BG numbers increase.   ASSESSMENT  There were no vitals taken for this visit. There is no height or weight on file to calculate BMI.      Diabetes Self-Management Education - 12/31/16 1411      Visit Information   Visit Type First/Initial     Initial Visit   Diabetes Type Type 2   Are you currently following a meal plan? No   Are you taking your medications as prescribed? Yes   Date Diagnosed don't know     Health Coping   How would you rate your overall health? Very Poor     Psychosocial Assessment   Patient Belief/Attitude about Diabetes Afraid  defeated and burned out   Other persons present Patient   How often do you need to have someone help you when you read instructions, pamphlets, or other written materials from your doctor or pharmacy? 2 - Rarely   What is the last grade level you completed in school? 12     Pre-Education Assessment   Patient understands the diabetes disease and treatment process. Needs Instruction   Patient understands incorporating nutritional management into lifestyle. Needs Instruction   Patient undertands incorporating physical activity into lifestyle. Needs Instruction   Patient understands using medications safely. Needs Instruction   Patient understands monitoring blood glucose, interpreting and using results Needs Review   Patient understands prevention, detection, and treatment of acute complications. Needs Review   Patient understands  prevention, detection, and treatment of chronic complications. Needs Review   Patient understands how to develop strategies to address psychosocial issues. Needs Instruction   Patient understands how to develop strategies to promote health/change behavior. Needs Instruction     Complications   Last HgB A1C per patient/outside source 11 %   How often do you check your blood sugar? 3-4 times/day   Have you had a dilated eye exam in the past 12 months? Yes   Have you had a dental exam in the past 12 months? Yes   Are you checking your feet? Yes   How many days per week are you checking your feet? 7     Exercise   Exercise Type ADL's   How many days per week to you exercise? 0   How many minutes per day do you exercise? 0   Total minutes per week of exercise 0     Patient Education   Previous Diabetes Education No   Disease state  Definition of diabetes, type 1 and 2, and the diagnosis of diabetes   Nutrition management  Role of diet in the treatment of diabetes and the relationship between the three main macronutrients and blood glucose level;Food label reading, portion sizes and measuring food.;Carbohydrate counting   Physical activity and exercise  Role of exercise on diabetes management, blood pressure control and cardiac health.   Medications Reviewed patients medication for diabetes, action, purpose, timing of dose and side effects.   Monitoring  Identified appropriate SMBG and/or A1C goals.   Acute complications Taught treatment of hypoglycemia - the 15 rule.   Chronic complications Relationship between chronic complications and blood glucose control;Assessed and discussed foot care and prevention of foot problems   Psychosocial adjustment Role of stress on diabetes     Individualized Goals (developed by patient)   Nutrition Follow meal plan discussed;General guidelines for healthy choices and portions discussed   Physical Activity Exercise 3-5 times per week   Medications take my  medication as prescribed   Monitoring  test blood glucose pre and post meals as discussed     Post-Education Assessment   Patient understands the diabetes disease and treatment process. Demonstrates understanding / competency   Patient understands incorporating nutritional management into lifestyle. Demonstrates understanding / competency   Patient understands using medications safely. Demonstrates understanding / competency   Patient understands monitoring blood glucose, interpreting and using results Demonstrates understanding / competency   Patient understands prevention, detection, and treatment of acute complications. Demonstrates understanding / competency     Outcomes   Expected Outcomes Demonstrated interest in learning. Expect positive outcomes   Future DMSE 4-6 wks   Program Status Completed      Individualized Plan for Diabetes Self-Management Training:   Learning Objective:  Patient will have a greater understanding of diabetes self-management. Patient education plan is to attend individual and/or group sessions per assessed needs and concerns.   Plan:   Patient Instructions  Plan:  Aim for 2-3 Carb Choices per meal (30-45 grams)  Aim for 0-1 Carbs per snack if hungry  Include protein in moderation with your meals and snacks Consider reading food labels for Total Carbohydrate of foods Consider  increasing your activity level daily as tolerated Consider checking BG at alternate times per day and after in increased activity level Continue taking medication as directed by MD  Expected Outcomes:  Demonstrated interest in learning. Expect positive outcomes  Education material provided: Living Well with Diabetes, Meal plan card and Carbohydrate counting sheet  If problems or questions, patient to contact team via:  Phone  Future DSME appointment: 4-6 wks

## 2017-01-07 NOTE — Telephone Encounter (Signed)
User: [name] Date/time: 01/06/17 12:28 PM  Comment: Called pt and lmsg on her voicemail due to message left on my VM per daughter [name] stating that the time did not work for her mother. [name] asked that I call the patient to reschedule. House number rang busy twice.  Context:  Outcome: Left Message  Phone number: [phone] Phone Type: Mobile  Comm. type: Telephone Call type: Outgoing  Contact: Susan [name] Relation to patient: Self    User: [name] Date/time: 01/05/17 2:10 PM  Comment: Called pt and lmsg for her to CB to r/s myoview.   Context:  Outcome: Left Message  Phone number: [phone] Phone Type: Mobile  Comm. type: Telephone Call type: Outgoing  Contact: Susan [name] Relation to patient: Self    User: [name] Date/time: 01/01/17 2:32 PM  Comment: Called pt and lmsg for her to CB to r/s myoview.   Context:  Outcome: Left Message  Phone number: [phone] Phone Type: Mobile  Comm. type: Telephone Call type: Outgoing  Contact: Susan [name] Relation to patient: Self

## 2017-01-07 NOTE — Patient Instructions (Addendum)
Plan:  Aim for 2-3 Carb Choices per meal (30-45 grams)  Aim for 0-1 Carbs per snack if hungry  Include protein in moderation with your meals and snacks Consider reading food labels for Total Carbohydrate of foods Consider  increasing your activity level daily as tolerated Consider checking BG at alternate times per day and after in increased activity level  Continue taking medication as directed by MD

## 2017-01-08 ENCOUNTER — Ambulatory Visit (HOSPITAL_COMMUNITY): Payer: PPO

## 2017-01-09 ENCOUNTER — Ambulatory Visit (HOSPITAL_COMMUNITY): Payer: PPO

## 2017-01-12 ENCOUNTER — Ambulatory Visit (INDEPENDENT_AMBULATORY_CARE_PROVIDER_SITE_OTHER): Payer: PPO | Admitting: Family Medicine

## 2017-01-12 ENCOUNTER — Encounter: Payer: Self-pay | Admitting: Family Medicine

## 2017-01-12 ENCOUNTER — Ambulatory Visit (HOSPITAL_COMMUNITY): Payer: PPO

## 2017-01-12 ENCOUNTER — Ambulatory Visit: Payer: PPO | Admitting: *Deleted

## 2017-01-12 VITALS — BP 138/82 | HR 76 | Temp 98.0°F | Resp 18 | Ht 65.0 in | Wt 291.0 lb

## 2017-01-12 DIAGNOSIS — Z794 Long term (current) use of insulin: Secondary | ICD-10-CM

## 2017-01-12 DIAGNOSIS — R609 Edema, unspecified: Secondary | ICD-10-CM | POA: Diagnosis not present

## 2017-01-12 DIAGNOSIS — E1122 Type 2 diabetes mellitus with diabetic chronic kidney disease: Secondary | ICD-10-CM

## 2017-01-12 DIAGNOSIS — N184 Chronic kidney disease, stage 4 (severe): Secondary | ICD-10-CM | POA: Diagnosis not present

## 2017-01-12 DIAGNOSIS — E1165 Type 2 diabetes mellitus with hyperglycemia: Secondary | ICD-10-CM | POA: Diagnosis not present

## 2017-01-12 DIAGNOSIS — N182 Chronic kidney disease, stage 2 (mild): Secondary | ICD-10-CM

## 2017-01-12 DIAGNOSIS — IMO0002 Reserved for concepts with insufficient information to code with codable children: Secondary | ICD-10-CM

## 2017-01-12 NOTE — Assessment & Plan Note (Signed)
She is working with dietician yet, I don't think that she's made very many changes especially with her increased weight gain last week from water retention and the fact that she has been eating fast food and biscuits which is causing some GI upset.

## 2017-01-12 NOTE — Patient Instructions (Addendum)
Referral to kidney doctor  Continue current medication F/U Friday for [facility] removal

## 2017-01-12 NOTE — Assessment & Plan Note (Signed)
Obtain records from her endocrinologist including the A1c. Renal function has been worsening over the past year or so. Didn't get her set up with nephrology think that be very difficult to treat her diabetes heart failure in the setting of her chronic kidney disease.

## 2017-01-12 NOTE — Assessment & Plan Note (Signed)
Significant swelling with some blisters from leaking fluid. I will place her in an of diabetes which she has had before. We will remove these in 5 days

## 2017-01-12 NOTE — Progress Notes (Addendum)
Subjective:    Patient ID: Susan [name], female    DOB: 1951/03/15, 66 y.o.   MRN: [id number]  Patient presents for Follow-up (is not fasting) Patient here for interim follow-up on chronic medical problems. Medications reviewed. She is no longer with [facility]  At her last visit she transition care to myself. She is significant chronic medical problems including congestive heart failure diabetes mellitus which is uncontrolled with neuropathy as well as psychosocial issues depression and anxiety.  Last visit she was referred to diabetic education she has emotional eating difficulties with carbs. We discussed going to see a therapist as well however she declined this. Diabetes mellitus her endocrinologist is Dr. [name] - had appt 2 weeks ago, Has appt Sept 11th, had recent adjustments in NovolinN 75, and another  5 units at bedtime, sliding scale of Novolin R with each meal , Highest blood sugar 300's fasting last week     Hyperlipidemia she was started on Crestor 20 mg at bedtime her LDL was elevated at 200, TG 224 TC 295  She was seen by cardiology last week her weight was up 5 pounds from baseline her to furosemide was increased to 100 mg twice a day for few days and she was post to go back to 80 mg twice a day. She did have some chest discomfort and they one to have repeat cardiac catheterization but she declined. She isn't have a Lexiscan performed instead   - she does stay [place]    Chronic kidney disease she had read labs done about 2 weeks ago when she was seen by cardiology. Her creatinine was up to 2.45with a BUN of 56 Her creatinine was at 1.8-2  proximally 6 months ago. She is not followed by neprhology    Diarrhea after eating in the mornings a few days a week ,states occurred after eating biscuit last time , this has been going on past few weeks  Also gets takes tums for heartburn    Review Of Systems:  GEN- denies fatigue, fever, weight  loss,weakness, recent illness HEENT- denies eye drainage, change in vision, nasal discharge, CVS- denies chest pain, palpitations RESP- denies SOB, cough, wheeze ABD- denies N/V, +change in stools, abd pain GU- denies dysuria, hematuria, dribbling, incontinence MSK- denies joint pain, muscle aches, injury Neuro- denies headache, dizziness, syncope, seizure activity       Objective:    BP 138/82   Pulse 76   Temp 98 F (36.7 C) (Oral)   Resp 18   Ht 5\' 5"  (1.651 m)   Wt 291 lb (132 kg)   SpO2 96%   BMI 48.42 kg/m  GEN- NAD, alert and oriented x3 HEENT- PERRL, EOMI, non injected sclera, pink conjunctiva, MMM, oropharynx clear Neck- Supple, no JVD CVS- RRR, no murmur RESP-CTAB ABD-NABS,soft,NT,ND EXT- 1+ edema with some blisters noted, chronic venous stasis changes  Pulses- Radial, 2+ DP- diminished+        Assessment & Plan:      Problem List Items Addressed This Visit      Unprioritized   Peripheral edema    Significant swelling with some blisters from leaking fluid. I will place her in an of diabetes which she has had before. We will remove these in 5 days      Morbid obesity ([place]) (Chronic)    She is working with dietician yet, I don't think that she's made very many changes especially with her increased weight gain  last week from water retention and the fact that she has been eating fast food and biscuits which is causing some GI upset.      Diabetes mellitus type II, uncontrolled ([place]) - Primary    Obtain records from her endocrinologist including the A1c. Renal function has been worsening over the past year or so. Didn't get her set up with nephrology think that be very difficult to treat her diabetes heart failure in the setting of her chronic kidney disease.      Relevant Orders   Ambulatory referral to Nephrology   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 mellitus [facility])   Relevant Orders   Ambulatory referral to Nephrology      Note: This dictation was  prepared with Dragon dictation along with smaller phrase technology. Any transcriptional errors that result from this process are unintentional.

## 2017-01-13 ENCOUNTER — Ambulatory Visit (HOSPITAL_COMMUNITY): Payer: PPO

## 2017-01-13 ENCOUNTER — Encounter: Payer: Self-pay | Admitting: *Deleted

## 2017-01-13 ENCOUNTER — Other Ambulatory Visit: Payer: Self-pay | Admitting: *Deleted

## 2017-01-13 ENCOUNTER — Ambulatory Visit (HOSPITAL_COMMUNITY)
Admission: RE | Admit: 2017-01-13 | Discharge: 2017-01-13 | Disposition: A | Discharge status: Home / Self Care | Payer: PPO | Source: Ambulatory Visit | Attending: Internal Medicine | Admitting: Internal Medicine

## 2017-01-13 VITALS — BP 162/78 | HR 93 | Wt 293.2 lb

## 2017-01-13 DIAGNOSIS — Z8673 Personal history of transient ischemic attack (TIA), and cerebral infarction without residual deficits: Secondary | ICD-10-CM | POA: Diagnosis not present

## 2017-01-13 DIAGNOSIS — N183 Chronic kidney disease, stage 3 (moderate): Secondary | ICD-10-CM | POA: Diagnosis not present

## 2017-01-13 DIAGNOSIS — Z794 Long term (current) use of insulin: Secondary | ICD-10-CM | POA: Diagnosis not present

## 2017-01-13 DIAGNOSIS — R5383 Other fatigue: Secondary | ICD-10-CM | POA: Insufficient documentation

## 2017-01-13 DIAGNOSIS — I4510 Unspecified right bundle-branch block: Secondary | ICD-10-CM | POA: Insufficient documentation

## 2017-01-13 DIAGNOSIS — E039 Hypothyroidism, unspecified: Secondary | ICD-10-CM | POA: Diagnosis not present

## 2017-01-13 DIAGNOSIS — I872 Venous insufficiency (chronic) (peripheral): Secondary | ICD-10-CM | POA: Insufficient documentation

## 2017-01-13 DIAGNOSIS — Z6841 Body Mass Index (BMI) 40.0 and over, adult: Secondary | ICD-10-CM | POA: Diagnosis not present

## 2017-01-13 DIAGNOSIS — M1990 Unspecified osteoarthritis, unspecified site: Secondary | ICD-10-CM | POA: Diagnosis not present

## 2017-01-13 DIAGNOSIS — G629 Polyneuropathy, unspecified: Secondary | ICD-10-CM | POA: Diagnosis not present

## 2017-01-13 DIAGNOSIS — F329 Major depressive disorder, single episode, unspecified: Secondary | ICD-10-CM | POA: Diagnosis not present

## 2017-01-13 DIAGNOSIS — I130 Hypertensive heart and chronic kidney disease with heart failure and stage 1 through stage 4 chronic kidney disease, or unspecified chronic kidney disease: Secondary | ICD-10-CM | POA: Diagnosis not present

## 2017-01-13 DIAGNOSIS — I5032 Chronic diastolic (congestive) heart failure: Secondary | ICD-10-CM | POA: Diagnosis not present

## 2017-01-13 DIAGNOSIS — F419 Anxiety disorder, unspecified: Secondary | ICD-10-CM | POA: Diagnosis not present

## 2017-01-13 DIAGNOSIS — Z87891 Personal history of nicotine dependence: Secondary | ICD-10-CM | POA: Insufficient documentation

## 2017-01-13 DIAGNOSIS — I429 Cardiomyopathy, unspecified: Secondary | ICD-10-CM | POA: Insufficient documentation

## 2017-01-13 DIAGNOSIS — Z7982 Long term (current) use of aspirin: Secondary | ICD-10-CM | POA: Insufficient documentation

## 2017-01-13 DIAGNOSIS — E785 Hyperlipidemia, unspecified: Secondary | ICD-10-CM | POA: Diagnosis not present

## 2017-01-13 DIAGNOSIS — Z9111 Patient's noncompliance with dietary regimen: Secondary | ICD-10-CM | POA: Insufficient documentation

## 2017-01-13 DIAGNOSIS — E669 Obesity, unspecified: Secondary | ICD-10-CM | POA: Diagnosis not present

## 2017-01-13 DIAGNOSIS — I25119 Atherosclerotic heart disease of native coronary artery with unspecified angina pectoris: Secondary | ICD-10-CM | POA: Diagnosis not present

## 2017-01-13 DIAGNOSIS — E1165 Type 2 diabetes mellitus with hyperglycemia: Secondary | ICD-10-CM | POA: Insufficient documentation

## 2017-01-13 DIAGNOSIS — I5033 Acute on chronic diastolic (congestive) heart failure: Secondary | ICD-10-CM | POA: Diagnosis not present

## 2017-01-13 DIAGNOSIS — E1122 Type 2 diabetes mellitus with diabetic chronic kidney disease: Secondary | ICD-10-CM | POA: Insufficient documentation

## 2017-01-13 DIAGNOSIS — I252 Old myocardial infarction: Secondary | ICD-10-CM | POA: Diagnosis not present

## 2017-01-13 LAB — BASIC METABOLIC PANEL
Anion gap: 9 (ref 5–15)
BUN: 59 mg/dL — ABNORMAL HIGH (ref 6–20)
CO2: 26 mmol/L (ref 22–32)
Calcium: 8.7 mg/dL — ABNORMAL LOW (ref 8.9–10.3)
Chloride: 98 mmol/L — ABNORMAL LOW (ref 101–111)
Creatinine, Ser: 1.95 mg/dL — ABNORMAL HIGH (ref 0.44–1.00)
GFR calc Af Amer: 29 mL/min — ABNORMAL LOW (ref >60)
GFR calc non Af Amer: 25 mL/min — ABNORMAL LOW (ref >60)
Glucose, Bld: 403 mg/dL — ABNORMAL HIGH (ref 65–99)
Potassium: 3.6 mmol/L (ref 3.5–5.1)
Sodium: 133 mmol/L — ABNORMAL LOW (ref 135–145)

## 2017-01-13 LAB — BRAIN NATRIURETIC PEPTIDE: B Natriuretic Peptide: 139.5 pg/mL — ABNORMAL HIGH (ref 0.0–100.0)

## 2017-01-13 MED ORDER — TORSEMIDE 20 MG PO TABS: 100.0000 mg | ORAL_TABLET | Freq: Two times a day (BID) | ORAL | 3 refills | Status: DC

## 2017-01-13 NOTE — Progress Notes (Signed)
fPatient ID: [name], female   DOB: [birth year], 66 y.o.   MRN: [id number]   PCP: Dr Edilia Bo  Cardiologist: Dr Myrtis Ser  Endocrinologist: Dr Lurene Shadow  Primary HF Cardiologist: Dr. Gala Romney  [facility] Patient  HPI: Mrs Zyskowski is a 66 year old with a history of RBBB, DM, Hypothyroid, diastolic heart failure, CAD MI 2000/2007 BMS 2007, TIA, obesity. Admitted with hyperglycemia x 2 in 2017.   Admitted June 1st through June 3rd, 2017 with altered mental status, hypothermia, and weakness. Diuretics initially held and later restarted. Unfortunately when she was discharged she refused home health.  Today she returns for HF follow up. Last visit she was volume overloaded and was instructed to increased torsemide and metolazone for few days. Says she feels terrible. SOB with exertion. Denies PND. + Orthopnea.Increase leg edema. Weight at home trending up. Drinking lots of fluids and ice. No fever or chills. Taking all medications.   ECHO 07/11/2015- 50-55%.  Grade I DD  RHC 09/22/13: RA = 4  RV = 30/5/7  PA = 25/10 (16)  PCW = 7  Fick cardiac output/index = 6.3/2.7  PVR = 1.5 WU   FA sat = 93%  PA sat = 61%, 66%   SH: Former smoker quit 15 year ago. Does drink alcohol. She is disabled. Lives at home with her husband and disabled son - Husband is truck Hospital doctor. Son has a brain injury after he attempted suicide  A few years ago. .    FH: Mom MI  ROS: All systems negative except as listed in HPI, PMH and Problem List.  Past Medical History:  Diagnosis Date  . Acute MI (HCC) 1999; 2007  . Anemia    hx  . Anginal pain (HCC)   . Anxiety   . Arthritis    "generalized" (03/15/2014)  . CAD (coronary artery disease)    MI in 2000 - MI  2007 - treated bare metal stent (no nuclear since then as 9/11)  . Carotid artery disease (HCC)   . CHF (congestive heart failure) (HCC)   . Chronic diastolic heart failure (HCC)    a) ECHO (08/2013) EF 55-60% and RV function nl b) RHC (08/2013) RA 4, RV 30/5/7, PA 25/10  (16), PCWP 7, Fick CO/CI 6.3/2.7, PVR 1.5 WU, PA 61 and 66%  . Daily headache    "~ every other day; since I fell in June" (03/15/2014)  . Depression   . Dyslipidemia   . Exertional shortness of breath   . HTN (hypertension)   . Hypothyroidism   . Obesity   . Osteoarthritis   . Peripheral neuropathy   . PONV (postoperative nausea and vomiting)   . RBBB (right bundle branch block)    Old  . Renal insufficiency    DENIES  . Stroke [facility])    mini strokes  . Syncope    likely due to low blood sugar  . Tachycardia    Sinus tachycardia  . Type II diabetes mellitus (HCC)   . Urinary incontinence   . Venous insufficiency     Current Outpatient Prescriptions  Medication Sig Dispense Refill  . albuterol (PROVENTIL HFA;VENTOLIN HFA) 108 (90 BASE) MCG/ACT inhaler Inhale 2 puffs into the lungs every 6 (six) hours as needed for wheezing or shortness of breath. Reported on 10/30/2015    . aspirin EC 81 MG tablet Take 81 mg by mouth daily.    . carvedilol (COREG) 12.5 MG tablet TAKE 1 AND 1/2 TABLETS BY MOUTH TWICE A DAY  WITH A MEAL 90 tablet 3  . clonazePAM (KLONOPIN) 1 MG tablet TAKE 1 TABLET BY MOUTH 3 TIMES A DAY 90 tablet 0  . clopidogrel (PLAVIX) 75 MG tablet TAKE 1 TABLET (75 MG TOTAL) BY MOUTH DAILY WITH BREAKFAST. 90 tablet 2  . ferrous sulfate 325 (65 FE) MG tablet Take 325 mg by mouth daily with breakfast. Reported on 08/21/2015    . insulin NPH Human (HUMULIN N,NOVOLIN N) 100 UNIT/ML injection Inject 75 units in the morning and 5 units at bedtime    . insulin regular (NOVOLIN R,HUMULIN R) 250 units/2.34mL (100 units/mL) injection Inject 5-40 Units into the skin 3 (three) times daily before meals.    . isosorbide dinitrate (ISORDIL) 10 MG tablet TAKE 1 TABLET BY MOUTH TWICE A DAY 60 tablet 6  . levothyroxine (SYNTHROID, LEVOTHROID) 50 MCG tablet Take 1 tablet (50 mcg total) by mouth daily before breakfast. 90 tablet 1  . losartan (COZAAR) 50 MG tablet TAKE 1 TABLET BY MOUTH EVERY DAY 90  tablet 0  . metolazone (ZAROXOLYN) 2.5 MG tablet TAKE 1 TABLET BY MOUTH TWICE WEEKLY (TUESDAY AND WEDNESDAY) WEIGHT GAIN/SHORTNESS OF BREATH/SWELLING 10 tablet 1  . PARoxetine (PAXIL-CR) 25 MG 24 hr tablet Take 25 mg by mouth daily. Take with 37.5 mg tablet daily (total 62.5 mg)    . PARoxetine (PAXIL-CR) 37.5 MG 24 hr tablet Take 37.5 mg by mouth daily. Take with 25 mg tablet daily (total 62.5 mg)    . pregabalin (LYRICA) 150 MG capsule Take 1 capsule (150 mg total) by mouth 2 (two) times daily. 60 capsule 2  . rosuvastatin (CRESTOR) 20 MG tablet Take 1 tablet (20 mg total) by mouth daily. 90 tablet 3  . torsemide (DEMADEX) 20 MG tablet Take 4 tablets (80 mg total) by mouth 2 (two) times daily. 240 tablet 3  . nitroGLYCERIN (NITROSTAT) 0.4 MG SL tablet PLACE 1 TABLET (0.4 MG TOTAL) UNDER THE TONGUE EVERY 5 (FIVE) MINUTES AS NEEDED FOR CHEST PAIN. (Patient not taking: Reported on 01/13/2017) 25 tablet 1   No current facility-administered medications for this encounter.     Vitals:   01/13/17 1420  BP: (!) 162/78  Pulse: 93  SpO2: 93%  Weight: 293 lb 3.2 oz (133 kg)   Wt Readings from Last 3 Encounters:  01/13/17 293 lb 3.2 oz (133 kg)  01/12/17 291 lb (132 kg)  12/25/16 288 lb 6.4 oz (130.8 kg)      PHYSICAL EXAM: General:  Appears fatigued. No resp difficulty. Walks slowly in the clinic.  HEENT: normal Neck: supple. JVP to jaw . Carotids 2+ bilat; no bruits. No lymphadenopathy or thryomegaly appreciated. Cor: PMI nondisplaced. Regular rate & rhythm. No rubs, gallops or murmurs. Lungs: clear Abdomen: soft, nontender, nondistended. No hepatosplenomegaly. No bruits or masses. Good bowel sounds. Extremities: no cyanosis, clubbing, rash,R and LLE unnal boots. Edema noted  Upper legs.  Neuro: alert & orientedx3, cranial nerves grossly intact. moves all 4 extremities w/o difficulty. Affect pleasant   ASSESSMENT & PLAN: 1. Acute/Chronic Diastolic Heart Failure: NICM, EF 55-60% Grade II  DD 06/2015 - NYHA IIIb today. Marked volume overload. Increase torsemide to 100 mg twice a day. Continue metolazone twice a week.  Check BMET and BNP today -  2.  Uncontrolled DM:   - Uncontrolled despite insulin. Remains uncontrolled. Instructed to follow up with Dr Lurene Shadow.     4. CAD, angina - Patient has a history of CAD with BMS in 2007  -No CP. On  statin, asa, and bb   5. Obesity: - Body mass index is 48.79 kg/m. Marland Kitchen 6. HTN - elevated. Follow up next week.    7. CKD Stage III- bmet today.Referred to nephrology yesterday.      8. Day time fatigue - She refuses sleep study.   Marked volume overload in the setting of dietary noncompliance.  Follow up next week to assess volume status.   Greater than 50% of the (total minutes 25) visit spent in counseling/coordination of care regarding heart failure and low salt diet.            Mitchel Delduca NP-C   2:26 PM

## 2017-01-13 NOTE — Patient Instructions (Signed)
INCREASE Torsemide 100 (5 tabs) twice a day  Labs today We will only contact you if something comes back abnormal or we need to make some changes. Otherwise no news is good news!   Your physician recommends that you schedule a follow-up appointment in: 1 week with [name],NP

## 2017-01-13 NOTE — Progress Notes (Signed)
Advanced Heart Failure Medication Review by a Pharmacist  Does the patient  feel that his/her medications are working for him/her?  yes  Has the patient been experiencing any side effects to the medications prescribed?  no  Does the patient measure his/her own blood pressure or blood glucose at home?  yes   Does the patient have any problems obtaining medications due to transportation or finances?   no  Understanding of regimen: good Understanding of indications: good Potential of compliance: good Patient understands to avoid NSAIDs. Patient understands to avoid decongestants.  Issues to address at subsequent visits: None   Pharmacist comments: Susan [name] is a pleasant 66 yo F presenting without a medication list but with good understanding of her regimen. She reports good compliance with her regimen but does feel like she is not urinating as well with her metolazone as she used to. She did not have any other medication-related questions or concerns for me at this time.   Ruta Hinds. Velva Harman, PharmD, BCPS, CPP Clinical Pharmacist Pager: [phone] Phone: [phone] 01/13/2017 2:26 PM      Time with patient: 12 minutes Preparation and documentation time: 2 minutes Total time: 14 minutes

## 2017-01-13 NOTE — Patient Outreach (Signed)
[facility]) Care Management  01/13/2017   Susan [name] [birth year] [id number]  RN Health Coach telephone call to patient.  [place]aa compliance verified. Per patient she was just waking up and had not checked her blood sugars this morning. Per patient yesterday it was 197 fasting. RN explained that this was  high and the goal range was 130 to help her get her A1C to 7.0. Per patient she was suppose too call her blood sugars into Dr [name] yesterday but she hasn't yet. RN told patient she needs to do that today so the Dr can see what she is running. Per patient she is eating two times a day around 11 am and 430 pm. RN discussed healthy snacks that patient could eat in between meals. Patient has had 2 episodes of hypoglycemia within the month. One resulting in passing out and EMS called. Per patient stated she felt sweaty and ate an atkins bar and found herself on the floor with EMS over her. Patient stated her blood sugar was 44. RN discussed with patient about getting insta gel so that it would be absorbed quicker.  Per patient she has gained weight. She is now 290 pounds. RN discussed with patient about fluid intake. Patient drinks lots of diet pepsi. RN explained to buy small cans instead of the large ones, or drink a half can and drink more water in place of sodas. Per patient she doesn't know how much water. Her doctor told her her creatine is 3 and she was referring her to a nephrologist. Patient has an appointment with the cardiologist today. RN explained that this is a good time to ask the cardiologist how much should her fluid intake be for the CHF. Patient was stating that she might not go. Her feet was so swollen. RN told patient this is the reason she needs to go more than ever. RN told patient she could wear her husband bedroom shoes if she has to.  Per patient she has developed a lot of weeping blisters on her legs. She now has UNA boots on bilateral legs. Patient has agreed to  follow up outreach calls.   Current Medications:  Current Outpatient Prescriptions  Medication Sig Dispense Refill  . albuterol (PROVENTIL HFA;VENTOLIN HFA) 108 (90 BASE) MCG/ACT inhaler Inhale 2 puffs into the lungs every 6 (six) hours as needed for wheezing or shortness of breath. Reported on 10/30/2015    . aspirin EC 81 MG tablet Take 81 mg by mouth daily.    . carvedilol (COREG) 12.5 MG tablet TAKE 1 AND 1/2 TABLETS BY MOUTH TWICE A DAY WITH A MEAL 90 tablet 3  . clonazePAM (KLONOPIN) 1 MG tablet TAKE 1 TABLET BY MOUTH 3 TIMES A DAY 90 tablet 0  . clopidogrel (PLAVIX) 75 MG tablet TAKE 1 TABLET (75 MG TOTAL) BY MOUTH DAILY WITH BREAKFAST. 90 tablet 2  . ferrous sulfate 325 (65 FE) MG tablet Take 325 mg by mouth daily with breakfast. Reported on 08/21/2015    . insulin NPH Human (HUMULIN N,NOVOLIN N) 100 UNIT/ML injection Inject 75 Units into the skin every morning.     . insulin regular (NOVOLIN R,HUMULIN R) 250 units/2.51mL (100 units/mL) injection Inject 5-40 Units into the skin 3 (three) times daily before meals.    . isosorbide dinitrate (ISORDIL) 10 MG tablet TAKE 1 TABLET BY MOUTH TWICE A DAY 60 tablet 6  . levothyroxine (SYNTHROID, LEVOTHROID) 50 MCG tablet Take 1 tablet (50 mcg total) by mouth daily before  breakfast. 90 tablet 1  . losartan (COZAAR) 50 MG tablet TAKE 1 TABLET BY MOUTH EVERY DAY 90 tablet 0  . metolazone (ZAROXOLYN) 2.5 MG tablet TAKE 1 TABLET BY MOUTH TWICE WEEKLY (TUESDAY AND WEDNESDAY) WEIGHT GAIN/SHORTNESS OF BREATH/SWELLING 10 tablet 1  . nitroGLYCERIN (NITROSTAT) 0.4 MG SL tablet PLACE 1 TABLET (0.4 MG TOTAL) UNDER THE TONGUE EVERY 5 (FIVE) MINUTES AS NEEDED FOR CHEST PAIN. 25 tablet 1  . PARoxetine (PAXIL-CR) 25 MG 24 hr tablet Take 25 mg by mouth daily. Take with 37.5 mg tablet daily (total 62.5 mg)    . PARoxetine (PAXIL-CR) 37.5 MG 24 hr tablet Take 37.5 mg by mouth daily. Take with 25 mg tablet daily (total 62.5 mg)    . pregabalin (LYRICA) 150 MG capsule Take  1 capsule (150 mg total) by mouth 2 (two) times daily. 60 capsule 2  . rosuvastatin (CRESTOR) 20 MG tablet Take 1 tablet (20 mg total) by mouth daily. 90 tablet 3  . torsemide (DEMADEX) 20 MG tablet Take 4 tablets (80 mg total) by mouth 2 (two) times daily. 240 tablet 3   No current facility-administered medications for this visit.     Functional Status:  In your present state of health, do you have any difficulty performing the following activities: 01/13/2017  Hearing? Y  Vision? N  Difficulty concentrating or making decisions? Y  Walking or climbing stairs? Y  Dressing or bathing? N  Doing errands, shopping? N  Preparing Food and eating ? N  Using the Toilet? N  In the past six months, have you accidently leaked urine? N  Do you have problems with loss of bowel control? N  Managing your Medications? N  Managing your Finances? N  Housekeeping or managing your Housekeeping? N  Some recent data might be hidden    Fall/Depression Screening: Fall Risk  01/13/2017 01/12/2017 12/31/2016  Falls in the past year? Yes No No  Comment - - -  Number falls in past yr: 1 - -  Injury with Fall? No - -  Comment - - -  Risk Factor Category  - - -  Risk for fall due to : Impaired balance/gait;History of fall(s) - -  Risk for fall due to: Comment - - -  Follow up Falls evaluation completed;Education provided;Falls prevention discussed - -   PHQ 2/9 Scores 01/13/2017 01/12/2017 12/31/2016 11/12/2016 08/06/2016 12/18/2015 10/19/2015  PHQ - 2 Score 0 0 0 5 6 4 4   PHQ- 9 Score - 9 - 21 26 13 16    [name] CM Care Plan Problem One     Most Recent Value  Care Plan Problem One  Knowledge deficit in self management of Diabetes  Role Documenting the Problem One  [place] for Problem One  Active  [name] Long Term Goal   Patient will see a decrease in the next A1C blood draw over the next 90 days  [name] Long Term Goal Start Date  01/13/17  Interventions for Problem One Long Term Goal  RN discussed what  the fasting blood sugars need to be to see a decrease in A1C to 7.0. RN discussed checking blood sugars and taking medications as prescribed by physician. RN will follow up outreach  [facility] CM Short Term Goal #1   Patient will check blood sugars as physician orders within the next 30 days  [name] CM Short Term Goal #1 Start Date  01/13/17  Interventions for Short Term Goal #1  RN discussed the imprtance  of checking the blood sugars as physician ordered. RN will send educational material on checking blood sugars  [name] CM Short Term Goal #2   Patient will report eating healthy snacks between meals within the next 30 days  [name] CM Short Term Goal #2 Start Date  01/13/17  Interventions for Short Term Goal #2  RN sent patient a list of healthy snacks to eat between meals. RN discussed foods that help to jump start the metabolism as snacks. RN will follow up with discussion and teach back      Assessment:  Patient fasting blood sugar was 197 on [id number] Patient needs to call blood sugar results to Dr [name] Patient had 2 hypoglycemic episodes in the past month Patient needs additional education on healthy snacks Patient is not checking blood sugars as per Dr ordered Patient will benefit from [facility] telephonic outreach for education and support for diabetes self management.  Plan:  RN discussed healthy snacks RN sent educational list of foods that make healthy snacks RN discussed checking blood sugars as per physician ordered RN sent educational material on checking blood sugars RN discussed healthy fluids to drink RN discussed hypoglycemic episodes with teach back Patient will look into getting insta gel for fast acting RN called Dr office regarding free style Cyndia Bent will send patient educational information on Free Style Elenor Legato RN will follow up with patient within the month of September for discussion and teach back RN will send barriers letter and assessment to physician  [place] [facility] [phone]  Addendum: RN spoke with Jeannetta Nap at Dr [name]  office regarding patient being a candidate for free style libre and obtaining orders

## 2017-01-14 ENCOUNTER — Ambulatory Visit (HOSPITAL_COMMUNITY): Payer: PPO

## 2017-01-15 ENCOUNTER — Telehealth (HOSPITAL_COMMUNITY): Payer: Self-pay

## 2017-01-15 ENCOUNTER — Encounter: Payer: Self-pay | Admitting: *Deleted

## 2017-01-15 NOTE — Telephone Encounter (Signed)
Encounter complete. 

## 2017-01-16 ENCOUNTER — Other Ambulatory Visit (HOSPITAL_COMMUNITY): Payer: Self-pay | Admitting: Cardiology

## 2017-01-16 ENCOUNTER — Encounter: Payer: Self-pay | Admitting: Family Medicine

## 2017-01-16 ENCOUNTER — Ambulatory Visit (INDEPENDENT_AMBULATORY_CARE_PROVIDER_SITE_OTHER): Payer: PPO | Admitting: Family Medicine

## 2017-01-16 DIAGNOSIS — Z794 Long term (current) use of insulin: Secondary | ICD-10-CM

## 2017-01-16 DIAGNOSIS — E1165 Type 2 diabetes mellitus with hyperglycemia: Secondary | ICD-10-CM

## 2017-01-16 DIAGNOSIS — R609 Edema, unspecified: Secondary | ICD-10-CM

## 2017-01-16 DIAGNOSIS — N182 Chronic kidney disease, stage 2 (mild): Secondary | ICD-10-CM

## 2017-01-16 DIAGNOSIS — E1122 Type 2 diabetes mellitus with diabetic chronic kidney disease: Secondary | ICD-10-CM

## 2017-01-16 DIAGNOSIS — IMO0002 Reserved for concepts with insufficient information to code with codable children: Secondary | ICD-10-CM

## 2017-01-16 MED ORDER — ISOSORBIDE DINITRATE 10 MG PO TABS: 10.0000 mg | ORAL_TABLET | Freq: Two times a day (BID) | ORAL | 6 refills | Status: DC

## 2017-01-16 NOTE — Assessment & Plan Note (Signed)
Uncontrolled increase bedtime dose of N to 6 units Continue SSI and morning dose of 75 units, though endocrine note states 65 units in AM

## 2017-01-16 NOTE — Progress Notes (Signed)
   Subjective:    Patient ID: Susan [name], female    DOB: 1950-08-19, 66 y.o.   MRN: [id number]  Patient presents for Follow-up (edema)  Here for unna boot removal Seen by cardiology on 8/21, demadex kept at 100mg  BID  and metalazone twice a week   weight down 3lbs of fluids    Sept 11 if next diabetes check Dr. [name] , I did receve note her A1C was  12% in August. Her CBG this week have been 400 except this AM    Lowest CBG 150 tis AM   Review Of Systems:  GEN- denies fatigue, fever, weight loss,weakness, recent illness HEENT- denies eye drainage, change in vision, nasal discharge, CVS- denies chest pain, palpitations RESP- denies SOB, cough, wheeze Neuro- denies headache, dizziness, syncope, seizure activity       Objective:    BP 130/88   Pulse 80   Temp 98.1 F (36.7 C) (Oral)   Resp 16   Ht 5\' 5"  (1.651 m)   Wt 288 lb (130.6 kg)   SpO2 96%   BMI 47.93 kg/m  GEN- NAD, alert and oriented x3 CVS- RRR, no murmur RESP-CTAB EXT- mild edema to shins chronic venous stasis changes  Pulses- Radial, 2+ DP- diminished+      Assessment & Plan:      Problem List Items Addressed This Visit      Unprioritized   Peripheral edema    Improved, with some weight loss Intake diuretics per cardiology. Continue elevating feet and use of compression hose. I will not replace the Unna boots today      Diabetes mellitus type II, uncontrolled ([place])    Uncontrolled increase bedtime dose of N to 6 units Continue SSI and morning dose of 75 units, though endocrine note states 65 units in AM          Note: This dictation was prepared with Dragon dictation along with smaller phrase technology. Any transcriptional errors that result from this process are unintentional.

## 2017-01-16 NOTE — Assessment & Plan Note (Signed)
Improved, with some weight loss Intake diuretics per cardiology. Continue elevating feet and use of compression hose. I will not replace the Unna boots today

## 2017-01-16 NOTE — Patient Instructions (Addendum)
Take 6 units of Novalin  at bedtime  Continue the water pills  F/U  6 weeks

## 2017-01-19 ENCOUNTER — Telehealth: Payer: Self-pay | Admitting: Cardiology

## 2017-01-19 ENCOUNTER — Ambulatory Visit (HOSPITAL_COMMUNITY)
Admission: RE | Admit: 2017-01-19 | Discharge: 2017-01-19 | Disposition: A | Discharge status: Home / Self Care | Payer: PPO | Source: Ambulatory Visit | Attending: Cardiology | Admitting: Cardiology

## 2017-01-19 ENCOUNTER — Other Ambulatory Visit: Payer: Self-pay | Admitting: Pharmacist

## 2017-01-19 ENCOUNTER — Encounter (HOSPITAL_COMMUNITY): Payer: Self-pay

## 2017-01-19 VITALS — BP 112/68 | HR 70 | Wt 289.4 lb

## 2017-01-19 DIAGNOSIS — F329 Major depressive disorder, single episode, unspecified: Secondary | ICD-10-CM | POA: Insufficient documentation

## 2017-01-19 DIAGNOSIS — E1142 Type 2 diabetes mellitus with diabetic polyneuropathy: Secondary | ICD-10-CM | POA: Diagnosis not present

## 2017-01-19 DIAGNOSIS — Z7982 Long term (current) use of aspirin: Secondary | ICD-10-CM | POA: Diagnosis not present

## 2017-01-19 DIAGNOSIS — I5032 Chronic diastolic (congestive) heart failure: Secondary | ICD-10-CM | POA: Diagnosis not present

## 2017-01-19 DIAGNOSIS — E1165 Type 2 diabetes mellitus with hyperglycemia: Secondary | ICD-10-CM

## 2017-01-19 DIAGNOSIS — E039 Hypothyroidism, unspecified: Secondary | ICD-10-CM | POA: Diagnosis not present

## 2017-01-19 DIAGNOSIS — R0602 Shortness of breath: Secondary | ICD-10-CM

## 2017-01-19 DIAGNOSIS — I428 Other cardiomyopathies: Secondary | ICD-10-CM | POA: Insufficient documentation

## 2017-01-19 DIAGNOSIS — N183 Chronic kidney disease, stage 3 unspecified: Secondary | ICD-10-CM

## 2017-01-19 DIAGNOSIS — E669 Obesity, unspecified: Secondary | ICD-10-CM | POA: Diagnosis not present

## 2017-01-19 DIAGNOSIS — I5033 Acute on chronic diastolic (congestive) heart failure: Secondary | ICD-10-CM | POA: Diagnosis not present

## 2017-01-19 DIAGNOSIS — Z7902 Long term (current) use of antithrombotics/antiplatelets: Secondary | ICD-10-CM | POA: Diagnosis not present

## 2017-01-19 DIAGNOSIS — Z8673 Personal history of transient ischemic attack (TIA), and cerebral infarction without residual deficits: Secondary | ICD-10-CM | POA: Insufficient documentation

## 2017-01-19 DIAGNOSIS — Z87891 Personal history of nicotine dependence: Secondary | ICD-10-CM | POA: Insufficient documentation

## 2017-01-19 DIAGNOSIS — F419 Anxiety disorder, unspecified: Secondary | ICD-10-CM | POA: Insufficient documentation

## 2017-01-19 DIAGNOSIS — Z6841 Body Mass Index (BMI) 40.0 and over, adult: Secondary | ICD-10-CM | POA: Diagnosis not present

## 2017-01-19 DIAGNOSIS — N182 Chronic kidney disease, stage 2 (mild): Secondary | ICD-10-CM | POA: Diagnosis not present

## 2017-01-19 DIAGNOSIS — Z794 Long term (current) use of insulin: Secondary | ICD-10-CM

## 2017-01-19 DIAGNOSIS — I25119 Atherosclerotic heart disease of native coronary artery with unspecified angina pectoris: Secondary | ICD-10-CM | POA: Insufficient documentation

## 2017-01-19 DIAGNOSIS — I872 Venous insufficiency (chronic) (peripheral): Secondary | ICD-10-CM | POA: Insufficient documentation

## 2017-01-19 DIAGNOSIS — Z79899 Other long term (current) drug therapy: Secondary | ICD-10-CM | POA: Insufficient documentation

## 2017-01-19 DIAGNOSIS — R5383 Other fatigue: Secondary | ICD-10-CM | POA: Insufficient documentation

## 2017-01-19 DIAGNOSIS — I130 Hypertensive heart and chronic kidney disease with heart failure and stage 1 through stage 4 chronic kidney disease, or unspecified chronic kidney disease: Secondary | ICD-10-CM | POA: Diagnosis not present

## 2017-01-19 DIAGNOSIS — I252 Old myocardial infarction: Secondary | ICD-10-CM | POA: Insufficient documentation

## 2017-01-19 DIAGNOSIS — Z8249 Family history of ischemic heart disease and other diseases of the circulatory system: Secondary | ICD-10-CM | POA: Insufficient documentation

## 2017-01-19 DIAGNOSIS — M1990 Unspecified osteoarthritis, unspecified site: Secondary | ICD-10-CM | POA: Insufficient documentation

## 2017-01-19 DIAGNOSIS — Z955 Presence of coronary angioplasty implant and graft: Secondary | ICD-10-CM | POA: Insufficient documentation

## 2017-01-19 DIAGNOSIS — E1122 Type 2 diabetes mellitus with diabetic chronic kidney disease: Secondary | ICD-10-CM | POA: Insufficient documentation

## 2017-01-19 DIAGNOSIS — I4510 Unspecified right bundle-branch block: Secondary | ICD-10-CM | POA: Diagnosis not present

## 2017-01-19 DIAGNOSIS — IMO0002 Reserved for concepts with insufficient information to code with codable children: Secondary | ICD-10-CM

## 2017-01-19 LAB — BASIC METABOLIC PANEL
Anion gap: 11 (ref 5–15)
BUN: 100 mg/dL — ABNORMAL HIGH (ref 6–20)
CO2: 27 mmol/L (ref 22–32)
Calcium: 8.5 mg/dL — ABNORMAL LOW (ref 8.9–10.3)
Chloride: 89 mmol/L — ABNORMAL LOW (ref 101–111)
Creatinine, Ser: 2.39 mg/dL — ABNORMAL HIGH (ref 0.44–1.00)
GFR calc Af Amer: 23 mL/min — ABNORMAL LOW (ref >60)
GFR calc non Af Amer: 20 mL/min — ABNORMAL LOW (ref >60)
Glucose, Bld: 591 mg/dL (ref 65–99)
Potassium: 3.8 mmol/L (ref 3.5–5.1)
Sodium: 127 mmol/L — ABNORMAL LOW (ref 135–145)

## 2017-01-19 MED ORDER — FUROSEMIDE 10 MG/ML IJ SOLN: 80.0000 mg | Freq: Once | INTRAMUSCULAR | 0 refills | Status: DC

## 2017-01-19 NOTE — Progress Notes (Signed)
fPatient ID: Susan [name], female   DOB: 07-13-1950, 66 y.o.   MRN: [id number]   PCP: Dr Scot Dock  Cardiologist: Dr Ron Parker  Endocrinologist: Dr Bubba Camp  Primary HF Cardiologist: Dr. Haroldine Laws  [facility] Patient  HPI: Susan [name] is a 66 year old with a history of RBBB, DM, Hypothyroid, diastolic heart failure, CAD MI 2000/2007 BMS 2007, TIA, obesity. Admitted with hyperglycemia x 2 in 2017.   Admitted June 1st through June 3rd, 2017 with altered mental status, hypothermia, and weakness. Diuretics initially held and later restarted. Unfortunately when she was discharged she refused home health.  Today returns for HF follow up. Appears very fatigued. Says that she is urinating often, but continues to eat out. She went out last night to eat. She eats many high salt snacks. Drinking more than 2L a day, says that she drinks ice water all day long. She is SOB with dressing. Taking torsemide and metolazone as prescribed. Weight down 5 pounds from last week but still volume overloaded on exam.     ECHO 07/11/2015- 50-55%.  Grade I DD  RHC 09/22/13: RA = 4  RV = 30/5/7  PA = 25/10 (16)  PCW = 7  Fick cardiac output/index = 6.3/2.7  PVR = 1.5 WU   FA sat = 93%  PA sat = 61%, 66%   SH: Former smoker quit 15 year ago. Does drink alcohol. She is disabled. Lives at home with her husband and disabled son - Husband is truck Geophysicist/field seismologist. Son has a brain injury after he attempted suicide  A few years ago. .    FH: Mom MI  ROS: All systems negative except as listed in HPI, PMH and Problem List.  Past Medical History:  Diagnosis Date  . Acute MI ([place]) 1999; 2007  . Anemia    hx  . Anginal pain ([place])   . Anxiety   . Arthritis    "generalized" (03/15/2014)  . CAD (coronary artery disease)    MI in 2000 - MI  2007 - treated bare metal stent (no nuclear since then as 9/11)  . Carotid artery disease ([place])   . CHF (congestive heart failure) ([place])   . Chronic diastolic heart failure (HCC)    a) ECHO (08/2013) EF  55-60% and RV function nl b) RHC (08/2013) RA 4, RV 30/5/7, PA 25/10 (16), PCWP 7, Fick CO/CI 6.3/2.7, PVR 1.5 WU, PA 61 and 66%  . Daily headache    "~ every other day; since I fell in June" (03/15/2014)  . Depression   . Dyslipidemia   . Exertional shortness of breath   . HTN (hypertension)   . Hypothyroidism   . Obesity   . Osteoarthritis   . Peripheral neuropathy   . PONV (postoperative nausea and vomiting)   . RBBB (right bundle branch block)    Old  . Renal insufficiency    DENIES  . Stroke [facility])    mini strokes  . Syncope    likely due to low blood sugar  . Tachycardia    Sinus tachycardia  . Type II diabetes mellitus ([place])   . Urinary incontinence   . Venous insufficiency     Current Outpatient Prescriptions  Medication Sig Dispense Refill  . albuterol (PROVENTIL HFA;VENTOLIN HFA) 108 (90 BASE) MCG/ACT inhaler Inhale 2 puffs into the lungs every 6 (six) hours as needed for wheezing or shortness of breath. Reported on 10/30/2015    . aspirin EC 81 MG tablet Take 81 mg by mouth daily.    Marland Kitchen  carvedilol (COREG) 12.5 MG tablet TAKE 1 AND 1/2 TABLETS BY MOUTH TWICE A DAY WITH A MEAL 90 tablet 3  . clonazePAM (KLONOPIN) 1 MG tablet TAKE 1 TABLET BY MOUTH 3 TIMES A DAY 90 tablet 0  . clopidogrel (PLAVIX) 75 MG tablet TAKE 1 TABLET (75 MG TOTAL) BY MOUTH DAILY WITH BREAKFAST. 90 tablet 2  . ferrous sulfate 325 (65 FE) MG tablet Take 325 mg by mouth daily with breakfast. Reported on 08/21/2015    . insulin NPH Human (HUMULIN N,NOVOLIN N) 100 UNIT/ML injection Inject 75 units in the morning and 6 units at bedtime    . insulin regular (NOVOLIN R,HUMULIN R) 250 units/2.41mL (100 units/mL) injection Inject 5-40 Units into the skin 3 (three) times daily before meals.    . isosorbide dinitrate (ISORDIL) 10 MG tablet Take 1 tablet (10 mg total) by mouth 2 (two) times daily. 60 tablet 6  . levothyroxine (SYNTHROID, LEVOTHROID) 50 MCG tablet Take 1 tablet (50 mcg total) by mouth daily before  breakfast. 90 tablet 1  . losartan (COZAAR) 50 MG tablet TAKE 1 TABLET BY MOUTH EVERY DAY 90 tablet 0  . metolazone (ZAROXOLYN) 2.5 MG tablet TAKE 1 TABLET BY MOUTH TWICE WEEKLY (TUESDAY AND WEDNESDAY) WEIGHT GAIN/SHORTNESS OF BREATH/SWELLING 10 tablet 1  . nitroGLYCERIN (NITROSTAT) 0.4 MG SL tablet PLACE 1 TABLET (0.4 MG TOTAL) UNDER THE TONGUE EVERY 5 (FIVE) MINUTES AS NEEDED FOR CHEST PAIN. 25 tablet 1  . PARoxetine (PAXIL-CR) 25 MG 24 hr tablet Take 25 mg by mouth daily. Take with 37.5 mg tablet daily (total 62.5 mg)    . PARoxetine (PAXIL-CR) 37.5 MG 24 hr tablet Take 37.5 mg by mouth daily. Take with 25 mg tablet daily (total 62.5 mg)    . pregabalin (LYRICA) 150 MG capsule Take 1 capsule (150 mg total) by mouth 2 (two) times daily. 60 capsule 2  . rosuvastatin (CRESTOR) 20 MG tablet Take 1 tablet (20 mg total) by mouth daily. 90 tablet 3  . torsemide (DEMADEX) 20 MG tablet Take 5 tablets (100 mg total) by mouth 2 (two) times daily. 300 tablet 3   No current facility-administered medications for this encounter.     Vitals:   01/19/17 1546  BP: 112/68  Pulse: 70  SpO2: 98%  Weight: 289 lb 6.4 oz (131.3 kg)   Wt Readings from Last 3 Encounters:  01/19/17 289 lb 6.4 oz (131.3 kg)  01/16/17 288 lb (130.6 kg)  01/13/17 293 lb 3.2 oz (133 kg)      PHYSICAL EXAM: General:  Appears fatigued. No resp difficulty. Walks slowly in the clinic.  HEENT: normal Neck: supple. JVP to jaw . Carotids 2+ bilat; no bruits. No lymphadenopathy or thryomegaly appreciated. Cor: PMI nondisplaced. Regular rate & rhythm. No rubs, gallops or murmurs. Lungs: clear Abdomen: soft, nontender, nondistended. No hepatosplenomegaly. No bruits or masses. Good bowel sounds. Extremities: no cyanosis, clubbing, rash,R and LLE unnal boots. Edema noted  Upper legs.  Neuro: alert & orientedx3, cranial nerves grossly intact. moves all 4 extremities w/o difficulty. Affect pleasant   ASSESSMENT & PLAN: 1. Acute/Chronic  Diastolic Heart Failure: NICM, EF 55-60% Grade II DD 06/2015 - NYHA IIIb - Remains volume overloaded.  - Continue torsemide 100 mg BID, take metolazone today.  - Continue metolazone on Tues/WEd.  - [facility] 80 mg tomorrow. Will also place on diuretic protocol.  2.  Uncontrolled DM:   - Follows with Dr. Bubba Camp.  4. CAD, angina - Patient has a history of CAD with BMS in 2007  -Denies chest pain. Continue ASA and statin.   5. Obesity: - Body mass index is 48.16 kg/m. Marland Kitchen 6. HTN - Well controlled today.   7. CKD Stage III-  - BMET today. Referred to nephrology last week.    8. Day time fatigue - She refuses sleep study.   IV lasix tomorrow in the home. Follow up in 2 weeks. I suspect she might need hospitalization if she does not improve with IV lasix. I have asked her to go to the ED if she develops worsening SOB, chest pain.    Arbutus Leas NP-C   3:55 PM

## 2017-01-19 NOTE — Telephone Encounter (Signed)
I received a call from Posey Boyer in the lab with critical glucose level of 591. I called the patient who has just arrived home. I informed her of her BG of 591. She says that explains why she feels so bad. She admits to fatigue and blurred vision. I advised her that this is very high and she should come to the ED. She says that she does not feel safe to drive and does not have anyone else that could drive her. I advised her to go by ambulance. She says that she wants to drink water and take insulin and try to get her sugar down on her own. She has a glucometer.  I again advised her that if she becomes lethargic she will not be able to request help and should come to the ED. She does not want to.  She agrees to check her BG now and then at 10pm after treatment and if still over 500 I have told her to call 911 and come to the hospital for treatment.   Daune Perch, AGNP-C [facility] 01/19/2017  6:52 PM Pager: [phone]

## 2017-01-19 NOTE — Patient Outreach (Signed)
[facility]) Care Management  01/19/2017  Susan [name] [birth year] [id number]   Patient was called to follow up.  HIPAA identifiers were obtained. Patient has been seen by cardiology and her primary care provider in the last week.  Torsemide was increased to 5 tablets twice daily.  Patient also takes metolazone 2.5mg  1 tablet twice weekly.  Novolin N was increased to from 75 in am and 5 in pm to 75 in am and 6 in pm.  Patient reported her legs were swollen this morning and her blood sugar was 412 mg/dl fasting this morning. When asked what she had to eat for dinner, she said [place] (fried fish, baked potato, hush puppies, slaw and sweet tea).  Patient was educated on the amount of sodium in her meal as well as the amount of carbohydrate in her meal and how sodium and carbohydrates affect her weight and blood sugar.  Patient reported having an appointment this afternoon with [facility].   Plan:  Follow up with patient in 21 business days. Patient is being followed by [place], Johny Shock, RN.   [name], PharmD, [place] Clinical Pharmacist [phone]

## 2017-01-19 NOTE — Patient Instructions (Signed)
Labs today (will call for abnormal results, otherwise no news is good news)  Take 1 Metolazone today  [place] has been ordered to come give IV lasix tomorrow, they will contact you.

## 2017-01-20 ENCOUNTER — Telehealth (HOSPITAL_COMMUNITY): Payer: Self-pay | Admitting: *Deleted

## 2017-01-20 ENCOUNTER — Inpatient Hospital Stay (HOSPITAL_COMMUNITY): Admission: RE | Admit: 2017-01-20 | Payer: PPO | Source: Ambulatory Visit

## 2017-01-20 NOTE — Telephone Encounter (Signed)
Patient called to let us know that she was unable to pickup IV lasix yesterday from [facility] because they had to order it and it wouldn't be in until today after lunch.  I faxed a prescription to [facility] in hope they would be able to get it to th epatient today.  I received a phone call from [facility] saying that the earliest they will be able to give it to patient would be tomorrow.  I spoke with Jettie Booze, NP and she is aware she asked me to reinforce the importance in limiting fluid/sodium intake.  I called patient back and she verbalized she understood and would watch her fluid/sodium intake. She also wanted to make [name] aware that her blood sugar this morning when she woke up was 210 and after breakfast it was 345.  She was about to take her insulin.  Jettie Booze, NP is aware.

## 2017-01-21 ENCOUNTER — Inpatient Hospital Stay (HOSPITAL_COMMUNITY): Admission: RE | Admit: 2017-01-21 | Payer: PPO | Source: Ambulatory Visit

## 2017-01-21 DIAGNOSIS — N184 Chronic kidney disease, stage 4 (severe): Secondary | ICD-10-CM | POA: Diagnosis not present

## 2017-01-21 DIAGNOSIS — I5032 Chronic diastolic (congestive) heart failure: Secondary | ICD-10-CM | POA: Diagnosis not present

## 2017-01-21 DIAGNOSIS — Z7902 Long term (current) use of antithrombotics/antiplatelets: Secondary | ICD-10-CM | POA: Diagnosis not present

## 2017-01-21 DIAGNOSIS — Z794 Long term (current) use of insulin: Secondary | ICD-10-CM | POA: Diagnosis not present

## 2017-01-21 DIAGNOSIS — F419 Anxiety disorder, unspecified: Secondary | ICD-10-CM | POA: Diagnosis not present

## 2017-01-21 DIAGNOSIS — I130 Hypertensive heart and chronic kidney disease with heart failure and stage 1 through stage 4 chronic kidney disease, or unspecified chronic kidney disease: Secondary | ICD-10-CM | POA: Diagnosis not present

## 2017-01-21 DIAGNOSIS — F329 Major depressive disorder, single episode, unspecified: Secondary | ICD-10-CM | POA: Diagnosis not present

## 2017-01-21 DIAGNOSIS — D649 Anemia, unspecified: Secondary | ICD-10-CM | POA: Diagnosis not present

## 2017-01-21 DIAGNOSIS — E1140 Type 2 diabetes mellitus with diabetic neuropathy, unspecified: Secondary | ICD-10-CM | POA: Diagnosis not present

## 2017-01-21 DIAGNOSIS — Z87891 Personal history of nicotine dependence: Secondary | ICD-10-CM | POA: Diagnosis not present

## 2017-01-21 DIAGNOSIS — I2510 Atherosclerotic heart disease of native coronary artery without angina pectoris: Secondary | ICD-10-CM | POA: Diagnosis not present

## 2017-01-21 DIAGNOSIS — Z7951 Long term (current) use of inhaled steroids: Secondary | ICD-10-CM | POA: Diagnosis not present

## 2017-01-21 DIAGNOSIS — E1122 Type 2 diabetes mellitus with diabetic chronic kidney disease: Secondary | ICD-10-CM | POA: Diagnosis not present

## 2017-01-21 DIAGNOSIS — Z4682 Encounter for fitting and adjustment of non-vascular catheter: Secondary | ICD-10-CM | POA: Diagnosis not present

## 2017-01-21 DIAGNOSIS — E785 Hyperlipidemia, unspecified: Secondary | ICD-10-CM | POA: Diagnosis not present

## 2017-01-23 ENCOUNTER — Telehealth (HOSPITAL_COMMUNITY): Payer: Self-pay

## 2017-01-23 NOTE — Telephone Encounter (Signed)
Encounter complete. 

## 2017-01-28 ENCOUNTER — Ambulatory Visit (HOSPITAL_COMMUNITY)
Admission: RE | Admit: 2017-01-28 | Discharge: 2017-01-28 | Disposition: A | Discharge status: Home / Self Care | Payer: PPO | Source: Ambulatory Visit | Attending: Cardiology | Admitting: Cardiology

## 2017-01-28 DIAGNOSIS — R4182 Altered mental status, unspecified: Secondary | ICD-10-CM | POA: Diagnosis not present

## 2017-01-28 DIAGNOSIS — Z8249 Family history of ischemic heart disease and other diseases of the circulatory system: Secondary | ICD-10-CM | POA: Diagnosis not present

## 2017-01-28 DIAGNOSIS — N189 Chronic kidney disease, unspecified: Secondary | ICD-10-CM | POA: Diagnosis not present

## 2017-01-28 DIAGNOSIS — I2510 Atherosclerotic heart disease of native coronary artery without angina pectoris: Secondary | ICD-10-CM | POA: Diagnosis not present

## 2017-01-28 DIAGNOSIS — R000 Tachycardia, unspecified: Secondary | ICD-10-CM | POA: Diagnosis not present

## 2017-01-28 DIAGNOSIS — I130 Hypertensive heart and chronic kidney disease with heart failure and stage 1 through stage 4 chronic kidney disease, or unspecified chronic kidney disease: Secondary | ICD-10-CM | POA: Insufficient documentation

## 2017-01-28 DIAGNOSIS — E119 Type 2 diabetes mellitus without complications: Secondary | ICD-10-CM | POA: Insufficient documentation

## 2017-01-28 DIAGNOSIS — E669 Obesity, unspecified: Secondary | ICD-10-CM | POA: Insufficient documentation

## 2017-01-28 DIAGNOSIS — I5032 Chronic diastolic (congestive) heart failure: Secondary | ICD-10-CM | POA: Diagnosis not present

## 2017-01-28 DIAGNOSIS — I252 Old myocardial infarction: Secondary | ICD-10-CM | POA: Diagnosis not present

## 2017-01-28 MED ORDER — TECHNETIUM TC 99M TETROFOSMIN IV KIT
32.3000 | PACK | Freq: Once | INTRAVENOUS | Status: AC | PRN
  Administered 2017-01-28: 32.3 via INTRAVENOUS
  Filled 2017-01-28: qty 33

## 2017-01-28 MED ORDER — REGADENOSON 0.4 MG/5ML IV SOLN
0.4000 mg | Freq: Once | INTRAVENOUS | Status: AC
  Administered 2017-01-28: 0.4 mg via INTRAVENOUS

## 2017-01-29 ENCOUNTER — Ambulatory Visit (HOSPITAL_COMMUNITY)
Admission: RE | Admit: 2017-01-29 | Discharge: 2017-01-29 | Disposition: A | Discharge status: Home / Self Care | Payer: PPO | Source: Ambulatory Visit | Attending: Internal Medicine | Admitting: Internal Medicine

## 2017-01-29 LAB — MYOCARDIAL PERFUSION IMAGING
LV dias vol: 100 mL (ref 46–106)
LV sys vol: 32 mL
Peak HR: 80 {beats}/min
Rest HR: 68 {beats}/min
SDS: 2
SRS: 3
SSS: 5
TID: 0.91

## 2017-01-29 MED ORDER — TECHNETIUM TC 99M TETROFOSMIN IV KIT
29.5000 | PACK | Freq: Once | INTRAVENOUS | Status: AC | PRN
  Administered 2017-01-29: 29.5 via INTRAVENOUS

## 2017-02-01 ENCOUNTER — Other Ambulatory Visit (HOSPITAL_COMMUNITY): Payer: Self-pay | Admitting: Internal Medicine

## 2017-02-02 ENCOUNTER — Encounter (HOSPITAL_COMMUNITY): Payer: Self-pay

## 2017-02-02 ENCOUNTER — Ambulatory Visit (HOSPITAL_COMMUNITY)
Admission: RE | Admit: 2017-02-02 | Discharge: 2017-02-02 | Disposition: A | Discharge status: Home / Self Care | Payer: PPO | Source: Ambulatory Visit | Attending: Cardiology | Admitting: Cardiology

## 2017-02-02 VITALS — BP 154/90 | HR 88 | Wt 290.4 lb

## 2017-02-02 DIAGNOSIS — I5033 Acute on chronic diastolic (congestive) heart failure: Secondary | ICD-10-CM

## 2017-02-02 DIAGNOSIS — I5032 Chronic diastolic (congestive) heart failure: Secondary | ICD-10-CM | POA: Diagnosis not present

## 2017-02-02 DIAGNOSIS — N183 Chronic kidney disease, stage 3 unspecified: Secondary | ICD-10-CM

## 2017-02-02 DIAGNOSIS — I2510 Atherosclerotic heart disease of native coronary artery without angina pectoris: Secondary | ICD-10-CM | POA: Diagnosis not present

## 2017-02-02 LAB — BRAIN NATRIURETIC PEPTIDE: B Natriuretic Peptide: 131.8 pg/mL — ABNORMAL HIGH (ref 0.0–100.0)

## 2017-02-02 LAB — BASIC METABOLIC PANEL
Anion gap: 11 (ref 5–15)
BUN: 84 mg/dL — ABNORMAL HIGH (ref 6–20)
CO2: 24 mmol/L (ref 22–32)
Calcium: 8.4 mg/dL — ABNORMAL LOW (ref 8.9–10.3)
Chloride: 98 mmol/L — ABNORMAL LOW (ref 101–111)
Creatinine, Ser: 1.84 mg/dL — ABNORMAL HIGH (ref 0.44–1.00)
GFR calc Af Amer: 32 mL/min — ABNORMAL LOW (ref >60)
GFR calc non Af Amer: 27 mL/min — ABNORMAL LOW (ref >60)
Glucose, Bld: 280 mg/dL — ABNORMAL HIGH (ref 65–99)
Potassium: 4.3 mmol/L (ref 3.5–5.1)
Sodium: 133 mmol/L — ABNORMAL LOW (ref 135–145)

## 2017-02-02 NOTE — Progress Notes (Signed)
fPatient ID: Susan [name], female   DOB: May 03, 1951, 66 y.o.   MRN: [id number]   PCP: Dr Scot Dock  Cardiologist: Dr Ron Parker  Endocrinologist: Dr Bubba Camp  Primary HF Cardiologist: Dr. Haroldine Laws  [facility] Patient  HPI: Susan [name] is a 66 year old with a history of RBBB, DM, Hypothyroid, diastolic heart failure, CAD MI 2000/2007 BMS 2007, TIA, obesity. Admitted with hyperglycemia x 2 in 2017.   Admitted June 1st through June 3rd, 2017 with altered mental status, hypothermia, and weakness. Diuretics initially held and later restarted.   Returns today for HF follow up. Overall feels very poorly. Last visit, 2 days of IV lasix was ordered and she only received one dose as she refused the second dose because she had things she needed to do. She continues to be non compliant with diet, had a hot dog today for lunch. Also, says that she thought she was supposed to drink more fluid so that the torsemide would "have something to work on". I reminded her of our conversation 2 weeks ago when I advised her to limit her fluid to less than 2L a day. She missed a day of her metolazone last week. Review of her weight over the past 6 months show a weight range of 277-290 pounds.   ECHO 07/11/2015- 50-55%.  Grade I DD  RHC 09/22/13: RA = 4  RV = 30/5/7  PA = 25/10 (16)  PCW = 7  Fick cardiac output/index = 6.3/2.7  PVR = 1.5 WU   FA sat = 93%  PA sat = 61%, 66%   SH: Former smoker quit 15 year ago. Does drink alcohol. She is disabled. Lives at home with her husband and disabled son - Husband is truck Geophysicist/field seismologist. Son has a brain injury after he attempted suicide  A few years ago. .    FH: Mom MI  ROS: All systems negative except as listed in HPI, PMH and Problem List.  Past Medical History:  Diagnosis Date  . Acute MI ([place]) 1999; 2007  . Anemia    hx  . Anginal pain ([place])   . Anxiety   . Arthritis    "generalized" (03/15/2014)  . CAD (coronary artery disease)    MI in 2000 - MI  2007 - treated bare metal stent  (no nuclear since then as 9/11)  . Carotid artery disease ([place])   . CHF (congestive heart failure) ([place])   . Chronic diastolic heart failure (HCC)    a) ECHO (08/2013) EF 55-60% and RV function nl b) RHC (08/2013) RA 4, RV 30/5/7, PA 25/10 (16), PCWP 7, Fick CO/CI 6.3/2.7, PVR 1.5 WU, PA 61 and 66%  . Daily headache    "~ every other day; since I fell in June" (03/15/2014)  . Depression   . Dyslipidemia   . Exertional shortness of breath   . HTN (hypertension)   . Hypothyroidism   . Obesity   . Osteoarthritis   . Peripheral neuropathy   . PONV (postoperative nausea and vomiting)   . RBBB (right bundle branch block)    Old  . Renal insufficiency    DENIES  . Stroke [facility])    mini strokes  . Syncope    likely due to low blood sugar  . Tachycardia    Sinus tachycardia  . Type II diabetes mellitus ([place])   . Urinary incontinence   . Venous insufficiency     Current Outpatient Prescriptions  Medication Sig Dispense Refill  . albuterol (PROVENTIL HFA;VENTOLIN  HFA) 108 (90 BASE) MCG/ACT inhaler Inhale 2 puffs into the lungs every 6 (six) hours as needed for wheezing or shortness of breath. Reported on 10/30/2015    . aspirin EC 81 MG tablet Take 81 mg by mouth daily.    . carvedilol (COREG) 12.5 MG tablet TAKE 1 AND 1/2 TABLETS BY MOUTH TWICE A DAY WITH A MEAL 90 tablet 3  . clonazePAM (KLONOPIN) 1 MG tablet TAKE 1 TABLET BY MOUTH 3 TIMES A DAY 90 tablet 0  . clopidogrel (PLAVIX) 75 MG tablet TAKE 1 TABLET (75 MG TOTAL) BY MOUTH DAILY WITH BREAKFAST. 90 tablet 2  . ferrous sulfate 325 (65 FE) MG tablet Take 325 mg by mouth daily with breakfast. Reported on 08/21/2015    . insulin NPH Human (HUMULIN N,NOVOLIN N) 100 UNIT/ML injection Inject 75 units in the morning and 6 units at bedtime    . insulin regular (NOVOLIN R,HUMULIN R) 250 units/2.65mL (100 units/mL) injection Inject 5-40 Units into the skin 3 (three) times daily before meals.    . isosorbide dinitrate (ISORDIL) 10 MG tablet Take 1  tablet (10 mg total) by mouth 2 (two) times daily. 60 tablet 6  . levothyroxine (SYNTHROID, LEVOTHROID) 50 MCG tablet Take 1 tablet (50 mcg total) by mouth daily before breakfast. 90 tablet 1  . losartan (COZAAR) 50 MG tablet TAKE 1 TABLET BY MOUTH EVERY DAY 90 tablet 0  . metolazone (ZAROXOLYN) 2.5 MG tablet TAKE 1 TABLET BY MOUTH TWICE WEEKLY (TUESDAY AND WEDNESDAY) WEIGHT GAIN/SHORTNESS OF BREATH/SWELLING 10 tablet 1  . nitroGLYCERIN (NITROSTAT) 0.4 MG SL tablet PLACE 1 TABLET (0.4 MG TOTAL) UNDER THE TONGUE EVERY 5 (FIVE) MINUTES AS NEEDED FOR CHEST PAIN. 25 tablet 1  . PARoxetine (PAXIL-CR) 25 MG 24 hr tablet Take 25 mg by mouth daily. Take with 37.5 mg tablet daily (total 62.5 mg)    . PARoxetine (PAXIL-CR) 37.5 MG 24 hr tablet Take 37.5 mg by mouth daily. Take with 25 mg tablet daily (total 62.5 mg)    . pregabalin (LYRICA) 150 MG capsule Take 1 capsule (150 mg total) by mouth 2 (two) times daily. 60 capsule 2  . rosuvastatin (CRESTOR) 20 MG tablet Take 1 tablet (20 mg total) by mouth daily. 90 tablet 3  . torsemide (DEMADEX) 20 MG tablet Take 5 tablets (100 mg total) by mouth 2 (two) times daily. 300 tablet 3  . furosemide (LASIX) 10 MG/ML injection Inject 8 mLs (80 mg total) into the vein once. 8 mL 0   No current facility-administered medications for this encounter.     Vitals:   02/02/17 1452  BP: (!) 154/90  Pulse: 88  SpO2: 98%  Weight: 290 lb 6.4 oz (131.7 kg)   Wt Readings from Last 3 Encounters:  02/02/17 290 lb 6.4 oz (131.7 kg)  01/28/17 288 lb (130.6 kg)  01/19/17 289 lb 6.4 oz (131.3 kg)      PHYSICAL EXAM: General: Fatigued appearing, obese. SOB with walking into clinic.  HEENT: Normal Neck: Supple. JVP to ear. Carotids 2+ bilat; no bruits. No thyromegaly or nodule noted. Cor: PMI nondisplaced. RRR, No M/G/R noted Lungs: Clear in upper lobes, diminished in bases.  Abdomen: Obese, soft, non-tender, + distended, no HSM. No bruits or masses. +BS  Extremities:  No cyanosis, clubbing, rash, 2+ edema to knees bilaterally.  Neuro: Alert & orientedx3, cranial nerves grossly intact. moves all 4 extremities w/o difficulty. Affect pleasant    ASSESSMENT & PLAN: 1. Acute on chronic Diastolic Heart  Failure: NICM, EF 55-60% Grade II DD 06/2015 - NYHA IIIb  - Volume overloaded on exam. Continue 100 mg torsemide BID and twice weekly metolazone. She will need her legs wrapped with Unna boots. Will order for [facility] to place.  - Dr. Haroldine Laws discussed [place] today. She will consider and let us know what she wants to do.  - Continue Coreg 12.5 mg BID - Continue losartan 50 mg daily.  - I talked with her about fluid restrictions and sodium restrictions. Has poor insight into her condition and often down plays her diagnosis.   2.  Uncontrolled DM:   - Will see Dr. Suzette Battiest tomorrow. She says that her pre-prandial blood sugars have improved and are now in the 200-300 range.    4. CAD, angina - Patient has a history of CAD with BMS in 2007  - Denies chest pain. Recently had a Lexiscan Myoview that was non ischemic.   5. Obesity: - Body mass index is 48.33 kg/m.  6. HTN - Hypertensive today. Has not taken her morning medications.   7. CKD Stage III-  - BMET today. She will see [facility] this week.   8. Day time fatigue - Refuses sleep study.      Arbutus Leas NP-C   2:55 PM  Patient seen and examined with Jettie Booze, NP. We discussed all aspects of the encounter. I agree with the assessment and plan as stated above.   She remains markedly volume overloaded with NYHA III-IIIB symptoms in setting of ongoing dietary noncompliance. We discussed proceeding with RHC +/- cardiomems sensor (though body habitus may not meet criteria). Will place UNNA boots. CAD is stable without evidence of ischemia. Check labs today. Continue f/u with Nephrology.   Total time spent 35 minutes. Over half that time spent discussing above.   Glori Bickers, MD  9:43  PM

## 2017-02-02 NOTE — Patient Instructions (Signed)
Will contact home health to set up Unna boots.  Routine lab work today. Will notify you of abnormal results, otherwise no news is good news!  Follow up 1 month.  ______________________________________________________________  ______________________________________________________________  Take all medication as prescribed the day of your appointment. Bring all medications with you to your appointment.  Do the following things EVERYDAY: 1) Weigh yourself in the morning before breakfast. Write it down and keep it in a log. 2) Take your medicines as prescribed 3) Eat low salt foods-Limit salt (sodium) to 2000 mg per day.  4) Stay as active as you can everyday 5) Limit all fluids for the day to less than 2 liters

## 2017-02-03 DIAGNOSIS — E1165 Type 2 diabetes mellitus with hyperglycemia: Secondary | ICD-10-CM | POA: Diagnosis not present

## 2017-02-09 ENCOUNTER — Other Ambulatory Visit: Payer: Self-pay | Admitting: Pharmacist

## 2017-02-09 NOTE — Patient Outreach (Signed)
[facility]) Care Management  02/09/2017  Navada Osterhout 1951/04/13 [id number]   Patient was called to follow up.  Unfortunately, she did not answer. HIPAA compliant message left on her voicemail.  Plan:  Call the patient back in 10-12 business days.  Consider closing the patient's case.   [name], PharmD, [place] Clinical Pharmacist [phone]

## 2017-02-11 ENCOUNTER — Other Ambulatory Visit: Payer: Self-pay | Admitting: *Deleted

## 2017-02-12 DIAGNOSIS — I5032 Chronic diastolic (congestive) heart failure: Secondary | ICD-10-CM | POA: Diagnosis not present

## 2017-02-12 DIAGNOSIS — D649 Anemia, unspecified: Secondary | ICD-10-CM | POA: Diagnosis not present

## 2017-02-12 DIAGNOSIS — E785 Hyperlipidemia, unspecified: Secondary | ICD-10-CM | POA: Diagnosis not present

## 2017-02-12 DIAGNOSIS — Z4682 Encounter for fitting and adjustment of non-vascular catheter: Secondary | ICD-10-CM | POA: Diagnosis not present

## 2017-02-12 DIAGNOSIS — Z87891 Personal history of nicotine dependence: Secondary | ICD-10-CM | POA: Diagnosis not present

## 2017-02-12 DIAGNOSIS — F419 Anxiety disorder, unspecified: Secondary | ICD-10-CM | POA: Diagnosis not present

## 2017-02-12 DIAGNOSIS — E1140 Type 2 diabetes mellitus with diabetic neuropathy, unspecified: Secondary | ICD-10-CM | POA: Diagnosis not present

## 2017-02-12 DIAGNOSIS — Z7951 Long term (current) use of inhaled steroids: Secondary | ICD-10-CM | POA: Diagnosis not present

## 2017-02-12 DIAGNOSIS — F329 Major depressive disorder, single episode, unspecified: Secondary | ICD-10-CM | POA: Diagnosis not present

## 2017-02-12 DIAGNOSIS — I2510 Atherosclerotic heart disease of native coronary artery without angina pectoris: Secondary | ICD-10-CM | POA: Diagnosis not present

## 2017-02-12 DIAGNOSIS — I130 Hypertensive heart and chronic kidney disease with heart failure and stage 1 through stage 4 chronic kidney disease, or unspecified chronic kidney disease: Secondary | ICD-10-CM | POA: Diagnosis not present

## 2017-02-12 DIAGNOSIS — N184 Chronic kidney disease, stage 4 (severe): Secondary | ICD-10-CM | POA: Diagnosis not present

## 2017-02-12 DIAGNOSIS — Z7902 Long term (current) use of antithrombotics/antiplatelets: Secondary | ICD-10-CM | POA: Diagnosis not present

## 2017-02-12 DIAGNOSIS — Z794 Long term (current) use of insulin: Secondary | ICD-10-CM | POA: Diagnosis not present

## 2017-02-12 DIAGNOSIS — E1122 Type 2 diabetes mellitus with diabetic chronic kidney disease: Secondary | ICD-10-CM | POA: Diagnosis not present

## 2017-02-18 DIAGNOSIS — Z7951 Long term (current) use of inhaled steroids: Secondary | ICD-10-CM | POA: Diagnosis not present

## 2017-02-18 DIAGNOSIS — Z7902 Long term (current) use of antithrombotics/antiplatelets: Secondary | ICD-10-CM | POA: Diagnosis not present

## 2017-02-18 DIAGNOSIS — Z794 Long term (current) use of insulin: Secondary | ICD-10-CM | POA: Diagnosis not present

## 2017-02-18 DIAGNOSIS — F419 Anxiety disorder, unspecified: Secondary | ICD-10-CM | POA: Diagnosis not present

## 2017-02-18 DIAGNOSIS — Z4682 Encounter for fitting and adjustment of non-vascular catheter: Secondary | ICD-10-CM | POA: Diagnosis not present

## 2017-02-18 DIAGNOSIS — F329 Major depressive disorder, single episode, unspecified: Secondary | ICD-10-CM | POA: Diagnosis not present

## 2017-02-18 DIAGNOSIS — N184 Chronic kidney disease, stage 4 (severe): Secondary | ICD-10-CM | POA: Diagnosis not present

## 2017-02-18 DIAGNOSIS — I5032 Chronic diastolic (congestive) heart failure: Secondary | ICD-10-CM | POA: Diagnosis not present

## 2017-02-18 DIAGNOSIS — D649 Anemia, unspecified: Secondary | ICD-10-CM | POA: Diagnosis not present

## 2017-02-18 DIAGNOSIS — E785 Hyperlipidemia, unspecified: Secondary | ICD-10-CM | POA: Diagnosis not present

## 2017-02-18 DIAGNOSIS — Z87891 Personal history of nicotine dependence: Secondary | ICD-10-CM | POA: Diagnosis not present

## 2017-02-18 DIAGNOSIS — I2510 Atherosclerotic heart disease of native coronary artery without angina pectoris: Secondary | ICD-10-CM | POA: Diagnosis not present

## 2017-02-18 DIAGNOSIS — E1122 Type 2 diabetes mellitus with diabetic chronic kidney disease: Secondary | ICD-10-CM | POA: Diagnosis not present

## 2017-02-18 DIAGNOSIS — E1140 Type 2 diabetes mellitus with diabetic neuropathy, unspecified: Secondary | ICD-10-CM | POA: Diagnosis not present

## 2017-02-18 DIAGNOSIS — I130 Hypertensive heart and chronic kidney disease with heart failure and stage 1 through stage 4 chronic kidney disease, or unspecified chronic kidney disease: Secondary | ICD-10-CM | POA: Diagnosis not present

## 2017-02-19 DIAGNOSIS — E1140 Type 2 diabetes mellitus with diabetic neuropathy, unspecified: Secondary | ICD-10-CM | POA: Diagnosis not present

## 2017-02-19 DIAGNOSIS — Z7951 Long term (current) use of inhaled steroids: Secondary | ICD-10-CM | POA: Diagnosis not present

## 2017-02-19 DIAGNOSIS — D649 Anemia, unspecified: Secondary | ICD-10-CM | POA: Diagnosis not present

## 2017-02-19 DIAGNOSIS — N184 Chronic kidney disease, stage 4 (severe): Secondary | ICD-10-CM | POA: Diagnosis not present

## 2017-02-19 DIAGNOSIS — F419 Anxiety disorder, unspecified: Secondary | ICD-10-CM | POA: Diagnosis not present

## 2017-02-19 DIAGNOSIS — F329 Major depressive disorder, single episode, unspecified: Secondary | ICD-10-CM | POA: Diagnosis not present

## 2017-02-19 DIAGNOSIS — Z4682 Encounter for fitting and adjustment of non-vascular catheter: Secondary | ICD-10-CM | POA: Diagnosis not present

## 2017-02-19 DIAGNOSIS — Z794 Long term (current) use of insulin: Secondary | ICD-10-CM | POA: Diagnosis not present

## 2017-02-19 DIAGNOSIS — E1122 Type 2 diabetes mellitus with diabetic chronic kidney disease: Secondary | ICD-10-CM | POA: Diagnosis not present

## 2017-02-19 DIAGNOSIS — Z87891 Personal history of nicotine dependence: Secondary | ICD-10-CM | POA: Diagnosis not present

## 2017-02-19 DIAGNOSIS — E785 Hyperlipidemia, unspecified: Secondary | ICD-10-CM | POA: Diagnosis not present

## 2017-02-19 DIAGNOSIS — I130 Hypertensive heart and chronic kidney disease with heart failure and stage 1 through stage 4 chronic kidney disease, or unspecified chronic kidney disease: Secondary | ICD-10-CM | POA: Diagnosis not present

## 2017-02-19 DIAGNOSIS — I5032 Chronic diastolic (congestive) heart failure: Secondary | ICD-10-CM | POA: Diagnosis not present

## 2017-02-19 DIAGNOSIS — Z7902 Long term (current) use of antithrombotics/antiplatelets: Secondary | ICD-10-CM | POA: Diagnosis not present

## 2017-02-19 DIAGNOSIS — I2510 Atherosclerotic heart disease of native coronary artery without angina pectoris: Secondary | ICD-10-CM | POA: Diagnosis not present

## 2017-02-20 ENCOUNTER — Encounter: Payer: Self-pay | Admitting: *Deleted

## 2017-02-20 NOTE — Patient Outreach (Signed)
[place] [facility]) Care Management  [id number] late entry  Raquel Racey 1950/10/05 [id number]  RN Health Coach telephone call to patient.  Hipaa compliance verified. Per patient her blood sugars have been all over the place. Her range has went over 300. Patient stated that she had baked a cake and had to have some of it. Patient discussed foods that she had been eating . RN took the whole meal and discussed with patient what foods are not appropriate for her diet. Patient stated that she has not been exercising . She stated it was hard for her to get out and walk around her house because the land is unlevel and patient felt she would fall. RN suggested that the patient go up to the church parking lot that is paved and level and walk around it for exercise. Patient stated she had not thought about that. Per patient she is not eating her breakfast until late and then by the time she is ready for dinner that she is so hungry she eats whatever she cooks and doesn't look at portion control.  Patient has agreed to follow up outreach calls.    Current Medications:  Current Outpatient Prescriptions  Medication Sig Dispense Refill  . albuterol (PROVENTIL HFA;VENTOLIN HFA) 108 (90 BASE) MCG/ACT inhaler Inhale 2 puffs into the lungs every 6 (six) hours as needed for wheezing or shortness of breath. Reported on 10/30/2015    . aspirin EC 81 MG tablet Take 81 mg by mouth daily.    . carvedilol (COREG) 12.5 MG tablet TAKE 1 AND 1/2 TABLETS BY MOUTH TWICE A DAY WITH A MEAL 90 tablet 3  . clonazePAM (KLONOPIN) 1 MG tablet TAKE 1 TABLET BY MOUTH 3 TIMES A DAY 90 tablet 0  . clopidogrel (PLAVIX) 75 MG tablet TAKE 1 TABLET (75 MG TOTAL) BY MOUTH DAILY WITH BREAKFAST. 90 tablet 2  . ferrous sulfate 325 (65 FE) MG tablet Take 325 mg by mouth daily with breakfast. Reported on 08/21/2015    . furosemide (LASIX) 10 MG/ML injection Inject 8 mLs (80 mg total) into the vein once. 8 mL 0  . insulin NPH Human (HUMULIN  N,NOVOLIN N) 100 UNIT/ML injection Inject 75 units in the morning and 6 units at bedtime    . insulin regular (NOVOLIN R,HUMULIN R) 250 units/2.74mL (100 units/mL) injection Inject 5-40 Units into the skin 3 (three) times daily before meals.    . isosorbide dinitrate (ISORDIL) 10 MG tablet Take 1 tablet (10 mg total) by mouth 2 (two) times daily. 60 tablet 6  . levothyroxine (SYNTHROID, LEVOTHROID) 50 MCG tablet Take 1 tablet (50 mcg total) by mouth daily before breakfast. 90 tablet 1  . losartan (COZAAR) 50 MG tablet TAKE 1 TABLET BY MOUTH EVERY DAY 90 tablet 0  . metolazone (ZAROXOLYN) 2.5 MG tablet TAKE 1 TABLET BY MOUTH TWICE WEEKLY (TUESDAY AND WEDNESDAY) WEIGHT GAIN/SHORTNESS OF BREATH/SWELLING 10 tablet 1  . nitroGLYCERIN (NITROSTAT) 0.4 MG SL tablet PLACE 1 TABLET (0.4 MG TOTAL) UNDER THE TONGUE EVERY 5 (FIVE) MINUTES AS NEEDED FOR CHEST PAIN. 25 tablet 1  . PARoxetine (PAXIL-CR) 25 MG 24 hr tablet Take 25 mg by mouth daily. Take with 37.5 mg tablet daily (total 62.5 mg)    . PARoxetine (PAXIL-CR) 37.5 MG 24 hr tablet Take 37.5 mg by mouth daily. Take with 25 mg tablet daily (total 62.5 mg)    . pregabalin (LYRICA) 150 MG capsule Take 1 capsule (150 mg total) by mouth 2 (two) times  daily. 60 capsule 2  . rosuvastatin (CRESTOR) 20 MG tablet Take 1 tablet (20 mg total) by mouth daily. 90 tablet 3  . torsemide (DEMADEX) 20 MG tablet Take 5 tablets (100 mg total) by mouth 2 (two) times daily. 300 tablet 3   No current facility-administered medications for this visit.     Functional Status:  In your present state of health, do you have any difficulty performing the following activities: 02/11/2017 01/13/2017  Hearing? Tempie Donning  Vision? N N  Difficulty concentrating or making decisions? Tempie Donning  Walking or climbing stairs? Y Y  Dressing or bathing? N N  Doing errands, shopping? N N  Preparing Food and eating ? N N  Using the Toilet? N N  In the past six months, have you accidently leaked urine? N N   Do you have problems with loss of bowel control? N N  Managing your Medications? N N  Managing your Finances? N N  Housekeeping or managing your Housekeeping? N N  Some recent data might be hidden    Fall/Depression Screening: Fall Risk  02/11/2017 01/13/2017 01/12/2017  Falls in the past year? Yes Yes No  Comment - - -  Number falls in past yr: 1 1 -  Injury with Fall? No No -  Comment - - -  Risk Factor Category  - - -  Risk for fall due to : Impaired balance/gait;History of fall(s) Impaired balance/gait;History of fall(s) -  Risk for fall due to: Comment - - -  Follow up Falls evaluation completed;Education provided;Falls prevention discussed Falls evaluation completed;Education provided;Falls prevention discussed -   PHQ 2/9 Scores 02/11/2017 01/13/2017 01/12/2017 12/31/2016 11/12/2016 08/06/2016 12/18/2015  PHQ - 2 Score 0 0 0 0 5 6 4   PHQ- 9 Score - - 9 - 21 26 13    [name] CM Care Plan Problem One     Most Recent Value  Care Plan Problem One  Knowledge deficit in self management of Diabetes  Role Documenting the Problem One  [place] for Problem One  Active  [name] Long Term Goal   Patient will see a decrease in the next A1C blood draw over the next 90 days  [name] Long Term Goal Start Date  02/11/17  Interventions for Problem One Long Term Goal  RN discussed what the fasting blood sugars need to be to see a decrease in A1C to 7.0. RN discussed checking blood sugars and taking medications as prescribed by physician. RN will follow up outreach  [facility] CM Short Term Goal #1   Patient will check blood sugars as physician orders within the next 30 days  Interventions for Short Term Goal #1  RN discussed the imprtance of checking the blood sugars as physician ordered. RN will send educational material on checking blood sugars  [name] CM Short Term Goal #2   Patient will report eating healthy snacks between meals within the next 30 days  [name] CM Short Term Goal #2 Start Date  02/11/17   Interventions for Short Term Goal #2  RN sent patient a list of healthy snacks to eat between meals. RN discussed foods that help to jump start the metabolism as snacks. RN will follow up with discussion and teach back      Assessment:  Patient is not adhering to her diet Patient blood sugars have run over 300 Patient has not been exercising Patient will continue to  benefit from [employer] telephonic outreach for education and support for  diabetes self management.  Plan:  RN discussed exercise places for patient in the community RN discussed importance of adhering to follow up appointments with physician RN discussed snacks that appropriate RN discussed portion control on foods that patient will not eliminate from diet RN will follow up with patient within the month of October.  [place] Care Management [phone]

## 2017-02-23 ENCOUNTER — Ambulatory Visit: Payer: Self-pay | Admitting: Pharmacist

## 2017-02-23 ENCOUNTER — Ambulatory Visit (HOSPITAL_COMMUNITY)
Admission: RE | Admit: 2017-02-23 | Discharge: 2017-02-23 | Disposition: A | Discharge status: Home / Self Care | Payer: PPO | Source: Ambulatory Visit | Attending: Cardiology | Admitting: Cardiology

## 2017-02-23 ENCOUNTER — Telehealth (HOSPITAL_COMMUNITY): Payer: Self-pay

## 2017-02-23 ENCOUNTER — Encounter (HOSPITAL_COMMUNITY): Payer: Self-pay

## 2017-02-23 VITALS — BP 122/64 | HR 77 | Wt 293.2 lb

## 2017-02-23 DIAGNOSIS — Z8673 Personal history of transient ischemic attack (TIA), and cerebral infarction without residual deficits: Secondary | ICD-10-CM | POA: Insufficient documentation

## 2017-02-23 DIAGNOSIS — N183 Chronic kidney disease, stage 3 unspecified: Secondary | ICD-10-CM

## 2017-02-23 DIAGNOSIS — I5032 Chronic diastolic (congestive) heart failure: Secondary | ICD-10-CM | POA: Diagnosis not present

## 2017-02-23 DIAGNOSIS — N182 Chronic kidney disease, stage 2 (mild): Secondary | ICD-10-CM

## 2017-02-23 DIAGNOSIS — I130 Hypertensive heart and chronic kidney disease with heart failure and stage 1 through stage 4 chronic kidney disease, or unspecified chronic kidney disease: Secondary | ICD-10-CM | POA: Diagnosis not present

## 2017-02-23 DIAGNOSIS — E118 Type 2 diabetes mellitus with unspecified complications: Secondary | ICD-10-CM

## 2017-02-23 DIAGNOSIS — Z6841 Body Mass Index (BMI) 40.0 and over, adult: Secondary | ICD-10-CM | POA: Insufficient documentation

## 2017-02-23 DIAGNOSIS — Z794 Long term (current) use of insulin: Secondary | ICD-10-CM | POA: Diagnosis not present

## 2017-02-23 DIAGNOSIS — I429 Cardiomyopathy, unspecified: Secondary | ICD-10-CM | POA: Diagnosis not present

## 2017-02-23 DIAGNOSIS — I10 Essential (primary) hypertension: Secondary | ICD-10-CM | POA: Diagnosis not present

## 2017-02-23 DIAGNOSIS — E1122 Type 2 diabetes mellitus with diabetic chronic kidney disease: Secondary | ICD-10-CM

## 2017-02-23 DIAGNOSIS — I25119 Atherosclerotic heart disease of native coronary artery with unspecified angina pectoris: Secondary | ICD-10-CM | POA: Diagnosis not present

## 2017-02-23 DIAGNOSIS — E1165 Type 2 diabetes mellitus with hyperglycemia: Secondary | ICD-10-CM

## 2017-02-23 DIAGNOSIS — R5383 Other fatigue: Secondary | ICD-10-CM | POA: Diagnosis not present

## 2017-02-23 DIAGNOSIS — E669 Obesity, unspecified: Secondary | ICD-10-CM | POA: Diagnosis not present

## 2017-02-23 DIAGNOSIS — Z7982 Long term (current) use of aspirin: Secondary | ICD-10-CM | POA: Insufficient documentation

## 2017-02-23 DIAGNOSIS — Z87891 Personal history of nicotine dependence: Secondary | ICD-10-CM | POA: Insufficient documentation

## 2017-02-23 DIAGNOSIS — Z79899 Other long term (current) drug therapy: Secondary | ICD-10-CM | POA: Diagnosis not present

## 2017-02-23 DIAGNOSIS — IMO0002 Reserved for concepts with insufficient information to code with codable children: Secondary | ICD-10-CM

## 2017-02-23 LAB — BASIC METABOLIC PANEL
Anion gap: 10 (ref 5–15)
BUN: 60 mg/dL — ABNORMAL HIGH (ref 6–20)
CO2: 25 mmol/L (ref 22–32)
Calcium: 8.2 mg/dL — ABNORMAL LOW (ref 8.9–10.3)
Chloride: 94 mmol/L — ABNORMAL LOW (ref 101–111)
Creatinine, Ser: 2.16 mg/dL — ABNORMAL HIGH (ref 0.44–1.00)
GFR calc Af Amer: 26 mL/min — ABNORMAL LOW (ref >60)
GFR calc non Af Amer: 22 mL/min — ABNORMAL LOW (ref >60)
Glucose, Bld: 467 mg/dL — ABNORMAL HIGH (ref 65–99)
Potassium: 4.7 mmol/L (ref 3.5–5.1)
Sodium: 129 mmol/L — ABNORMAL LOW (ref 135–145)

## 2017-02-23 NOTE — Telephone Encounter (Signed)
CHF Clinic appointment reminder call placed to patient for upcoming post-hospital follow up.  Does understand purpose of this appointment and where CHF Clinic is located? Yes  How is patient feeling? Baseline, no better/no worse.  SOB and swelling BLEE, does states she now has unna boots that were ordered from last OV with our office.  Does patient have all of their medications since their recent discharge? Yes  Patient also reminded to take all medications as prescribed on the day of his/her appointment and to bring all medications to this appointment.  Advised to call our office for tardiness or cancellations/rescheduling needs.  Leory Plowman, Guinevere Ferrari

## 2017-02-23 NOTE — Progress Notes (Signed)
fPatient ID: Susan [name], female   DOB: [birth year], 66 y.o.   MRN: [id number]   PCP: Dr Scot Dock  Cardiologist: Dr Ron Parker  Endocrinologist: Dr Bubba Camp  Primary HF Cardiologist: Dr. Haroldine Laws  [facility] Patient  HPI: Susan [name] is a 66 year old with a history of RBBB, DM, Hypothyroid, diastolic heart failure, CAD MI 2000/2007 BMS 2007, TIA, obesity. Admitted with hyperglycemia x 2 in 2017.   Admitted June 1st through June 3rd, 2017 with altered mental status, hypothermia, and weakness. Diuretics initially held and later restarted.   Susan [name] returns today for HF follow up. Weight continues to trend up, she continues to eat high salt foods. This am had breakfast at [facility], drinking more than 2L a day. She does not take torsemide when she goes out for the day as she does not want to urinate. Denies chest pain, orthopnea, PND. + bendopnea.   ECHO 07/11/2015- 50-55%.  Grade I DD  RHC 09/22/13: RA = 4  RV = 30/5/7  PA = 25/10 (16)  PCW = 7  Fick cardiac output/index = 6.3/2.7  PVR = 1.5 WU   FA sat = 93%  PA sat = 61%, 66%   SH: Former smoker quit 15 year ago. Does drink alcohol. She is disabled. Lives at home with her husband and disabled son - Husband is truck Geophysicist/field seismologist. Son has a brain injury after he attempted suicide  A few years ago. .    FH: Susan [name] MI  ROS: All systems negative except as listed in HPI, PMH and Problem List.  Past Medical History:  Diagnosis Date  . Acute MI ([place]) 1999; 2007  . Anemia    hx  . Anginal pain ([place])   . Anxiety   . Arthritis    "generalized" (03/15/2014)  . CAD (coronary artery disease)    MI in 2000 - MI  2007 - treated bare metal stent (no nuclear since then as 9/11)  . Carotid artery disease ([place])   . CHF (congestive heart failure) ([place])   . Chronic diastolic heart failure (HCC)    a) ECHO (08/2013) EF 55-60% and RV function nl b) RHC (08/2013) RA 4, RV 30/5/7, PA 25/10 (16), PCWP 7, Fick CO/CI 6.3/2.7, PVR 1.5 WU, PA 61 and 66%  . Daily headache    "~ every  other day; since I fell in June" (03/15/2014)  . Depression   . Dyslipidemia   . Exertional shortness of breath   . HTN (hypertension)   . Hypothyroidism   . Obesity   . Osteoarthritis   . Peripheral neuropathy   . PONV (postoperative nausea and vomiting)   . RBBB (right bundle branch block)    Old  . Renal insufficiency    DENIES  . Stroke [facility])    mini strokes  . Syncope    likely due to low blood sugar  . Tachycardia    Sinus tachycardia  . Type II diabetes mellitus ([place])   . Urinary incontinence   . Venous insufficiency     Current Outpatient Prescriptions  Medication Sig Dispense Refill  . albuterol (PROVENTIL HFA;VENTOLIN HFA) 108 (90 BASE) MCG/ACT inhaler Inhale 2 puffs into the lungs every 6 (six) hours as needed for wheezing or shortness of breath. Reported on 10/30/2015    . aspirin EC 81 MG tablet Take 81 mg by mouth daily.    . carvedilol (COREG) 12.5 MG tablet TAKE 1 AND 1/2 TABLETS BY MOUTH TWICE A DAY WITH A MEAL 90 tablet  3  . clonazePAM (KLONOPIN) 1 MG tablet TAKE 1 TABLET BY MOUTH 3 TIMES A DAY 90 tablet 0  . clopidogrel (PLAVIX) 75 MG tablet TAKE 1 TABLET (75 MG TOTAL) BY MOUTH DAILY WITH BREAKFAST. 90 tablet 2  . ferrous sulfate 325 (65 FE) MG tablet Take 325 mg by mouth daily with breakfast. Reported on 08/21/2015    . insulin NPH Human (HUMULIN N,NOVOLIN N) 100 UNIT/ML injection Inject 75 units in the morning and 6 units at bedtime    . insulin regular (NOVOLIN R,HUMULIN R) 250 units/2.58mL (100 units/mL) injection Inject 5-40 Units into the skin 3 (three) times daily before meals.    . isosorbide dinitrate (ISORDIL) 10 MG tablet Take 1 tablet (10 mg total) by mouth 2 (two) times daily. 60 tablet 6  . levothyroxine (SYNTHROID, LEVOTHROID) 50 MCG tablet Take 1 tablet (50 mcg total) by mouth daily before breakfast. 90 tablet 1  . losartan (COZAAR) 50 MG tablet TAKE 1 TABLET BY MOUTH EVERY DAY 90 tablet 0  . metolazone (ZAROXOLYN) 2.5 MG tablet TAKE 1 TABLET BY  MOUTH TWICE WEEKLY (TUESDAY AND WEDNESDAY) WEIGHT GAIN/SHORTNESS OF BREATH/SWELLING 10 tablet 1  . nitroGLYCERIN (NITROSTAT) 0.4 MG SL tablet PLACE 1 TABLET (0.4 MG TOTAL) UNDER THE TONGUE EVERY 5 (FIVE) MINUTES AS NEEDED FOR CHEST PAIN. 25 tablet 1  . PARoxetine (PAXIL-CR) 25 MG 24 hr tablet Take 25 mg by mouth daily. Take with 37.5 mg tablet daily (total 62.5 mg)    . PARoxetine (PAXIL-CR) 37.5 MG 24 hr tablet Take 37.5 mg by mouth daily. Take with 25 mg tablet daily (total 62.5 mg)    . pregabalin (LYRICA) 150 MG capsule Take 1 capsule (150 mg total) by mouth 2 (two) times daily. 60 capsule 2  . rosuvastatin (CRESTOR) 20 MG tablet Take 1 tablet (20 mg total) by mouth daily. 90 tablet 3  . torsemide (DEMADEX) 20 MG tablet Take 5 tablets (100 mg total) by mouth 2 (two) times daily. 300 tablet 3  . furosemide (LASIX) 10 MG/ML injection Inject 8 mLs (80 mg total) into the vein once. 8 mL 0   No current facility-administered medications for this encounter.     Vitals:   02/23/17 1501  BP: 122/64  Pulse: 77  SpO2: 95%  Weight: 293 lb 3.2 oz (133 kg)   Wt Readings from Last 3 Encounters:  02/23/17 293 lb 3.2 oz (133 kg)  02/02/17 290 lb 6.4 oz (131.7 kg)  01/28/17 288 lb (130.6 kg)      PHYSICAL EXAM: General: Fatigued, obese appearing. No resp difficulty. HEENT: Normal Neck: Supple. JVP to jaw. Carotids 2+ bilat; no bruits. No thyromegaly or nodule noted. Cor: PMI nondisplaced. RRR, No M/G/R noted Lungs: CTAB, normal effort. Abdomen: Soft, non-tender, + distended, no HSM. No bruits or masses. +BS  Extremities: No cyanosis, clubbing, rash,2+ edema to knees.  Neuro: Alert & orientedx3, cranial nerves grossly intact. moves all 4 extremities w/o difficulty. Affect pleasant     ASSESSMENT & PLAN: 1. Chronic Diastolic Heart Failure: NICM, EF 55-60% Grade II DD 06/2015 - NYHA III - Volume overloaded on exam due to medication non compliance and ongoing dietary non compliance.  -  Continue current dose of torsemide 100 mg BID. Take metolazone twice weekly.  - Unna boots can come off this week.  - Continue Coreg 12.5 mg BID - Continue losartan 50 mg daily.  - Talked to her about diet and fluid restrictions. She continues to have poor insight into  her health.   2.  Uncontrolled DM:   - CBG's remain high. Follows with Dr. Suzette Battiest.   4. CAD, angina - No recent chest pain - Myoview non ischemic  5. Obesity: -Body mass index is 48.79 kg/m. - Encouraged her to limit portion size and increase activity.   6. HTN - Well controlled on current regimen.   7. CKD Stage III - BMET today. She will establish with Dr. Hollie Salk.   8. Day time fatigue - Refuses sleep study.    Follow up in 2 months.     Arbutus Leas NP-C   3:03 PM    Greater than 50% of the (total minutes 30) visit spent in counseling/coordination of care regarding medication compliance, dietary restrictions.

## 2017-02-23 NOTE — Patient Instructions (Signed)
Routine lab work today. Will notify you of abnormal results, otherwise no news is good news!  No changes to medication at this time.  Follow up 2 months with [name] NP-C.  ______________________________________________________________________ Susan [name] Code: 8002  Take all medication as prescribed the day of your appointment. Bring all medications with you to your appointment.  Do the following things EVERYDAY: 1) Weigh yourself in the morning before breakfast. Write it down and keep it in a log. 2) Take your medicines as prescribed 3) Eat low salt foods-Limit salt (sodium) to 2000 mg per day.  4) Stay as active as you can everyday 5) Limit all fluids for the day to less than 2 liters

## 2017-02-24 ENCOUNTER — Telehealth (HOSPITAL_COMMUNITY): Payer: Self-pay | Admitting: *Deleted

## 2017-02-24 DIAGNOSIS — I129 Hypertensive chronic kidney disease with stage 1 through stage 4 chronic kidney disease, or unspecified chronic kidney disease: Secondary | ICD-10-CM | POA: Diagnosis not present

## 2017-02-24 DIAGNOSIS — I509 Heart failure, unspecified: Secondary | ICD-10-CM | POA: Diagnosis not present

## 2017-02-24 DIAGNOSIS — E1129 Type 2 diabetes mellitus with other diabetic kidney complication: Secondary | ICD-10-CM | POA: Diagnosis not present

## 2017-02-24 DIAGNOSIS — N184 Chronic kidney disease, stage 4 (severe): Secondary | ICD-10-CM | POA: Diagnosis not present

## 2017-02-24 NOTE — Telephone Encounter (Signed)
Advanced Heart Failure Triage Encounter  Patient Name: Susan [name]  Date of Call: 02/24/17  Problem: [facility] health RN called asking if patient needed to continue una boots.    Plan:  Per Jettie Booze, NP Ardelia Mems boots can be discontinued at this time and patient will be further evaluated at her next scheduled appointment.  Home health RN is aware.   Darron Doom, RN

## 2017-02-25 ENCOUNTER — Other Ambulatory Visit: Payer: Self-pay | Admitting: Pharmacist

## 2017-02-25 DIAGNOSIS — I130 Hypertensive heart and chronic kidney disease with heart failure and stage 1 through stage 4 chronic kidney disease, or unspecified chronic kidney disease: Secondary | ICD-10-CM | POA: Diagnosis not present

## 2017-02-25 DIAGNOSIS — Z4682 Encounter for fitting and adjustment of non-vascular catheter: Secondary | ICD-10-CM | POA: Diagnosis not present

## 2017-02-25 DIAGNOSIS — D649 Anemia, unspecified: Secondary | ICD-10-CM | POA: Diagnosis not present

## 2017-02-25 DIAGNOSIS — E1140 Type 2 diabetes mellitus with diabetic neuropathy, unspecified: Secondary | ICD-10-CM | POA: Diagnosis not present

## 2017-02-25 DIAGNOSIS — Z794 Long term (current) use of insulin: Secondary | ICD-10-CM | POA: Diagnosis not present

## 2017-02-25 DIAGNOSIS — F329 Major depressive disorder, single episode, unspecified: Secondary | ICD-10-CM | POA: Diagnosis not present

## 2017-02-25 DIAGNOSIS — Z7902 Long term (current) use of antithrombotics/antiplatelets: Secondary | ICD-10-CM | POA: Diagnosis not present

## 2017-02-25 DIAGNOSIS — E1122 Type 2 diabetes mellitus with diabetic chronic kidney disease: Secondary | ICD-10-CM | POA: Diagnosis not present

## 2017-02-25 DIAGNOSIS — N184 Chronic kidney disease, stage 4 (severe): Secondary | ICD-10-CM | POA: Diagnosis not present

## 2017-02-25 DIAGNOSIS — I5032 Chronic diastolic (congestive) heart failure: Secondary | ICD-10-CM | POA: Diagnosis not present

## 2017-02-25 DIAGNOSIS — I2510 Atherosclerotic heart disease of native coronary artery without angina pectoris: Secondary | ICD-10-CM | POA: Diagnosis not present

## 2017-02-25 DIAGNOSIS — Z87891 Personal history of nicotine dependence: Secondary | ICD-10-CM | POA: Diagnosis not present

## 2017-02-25 DIAGNOSIS — E785 Hyperlipidemia, unspecified: Secondary | ICD-10-CM | POA: Diagnosis not present

## 2017-02-25 DIAGNOSIS — F419 Anxiety disorder, unspecified: Secondary | ICD-10-CM | POA: Diagnosis not present

## 2017-02-25 DIAGNOSIS — Z7951 Long term (current) use of inhaled steroids: Secondary | ICD-10-CM | POA: Diagnosis not present

## 2017-02-25 NOTE — Patient Outreach (Addendum)
[facility]) Care Management  [place]   02/25/2017  Susan [name] 04/05/1951 [id number]  Subjective: Patient was called to follow up with her. HIPAAPatient is a 66 year old female with multiple medical conditions including: Type 2 diabetes, hypothyroidism, heart failure NYHA class III CAD, history of MI, TIA, obesity and CKD stage 3.  -saw MD for heart failure 02/23/17 -saw nephrologist 02/24/17 Will see Dr [name] 02/27/17   Objective:   Weight today-293, yesterday 293  Blood sugar-couldn't get a sample today Blood sugar --Yesterday 238 mg/dl   Encounter Medications: Outpatient Encounter Prescriptions as of 02/25/2017  Medication Sig  . albuterol (PROVENTIL HFA;VENTOLIN HFA) 108 (90 BASE) MCG/ACT inhaler Inhale 2 puffs into the lungs every 6 (six) hours as needed for wheezing or shortness of breath. Reported on 10/30/2015  . aspirin EC 81 MG tablet Take 81 mg by mouth daily.  . carvedilol (COREG) 12.5 MG tablet TAKE 1 AND 1/2 TABLETS BY MOUTH TWICE A DAY WITH A MEAL  . clonazePAM (KLONOPIN) 1 MG tablet TAKE 1 TABLET BY MOUTH 3 TIMES A DAY  . clopidogrel (PLAVIX) 75 MG tablet TAKE 1 TABLET (75 MG TOTAL) BY MOUTH DAILY WITH BREAKFAST.  . ferrous sulfate 325 (65 FE) MG tablet Take 325 mg by mouth daily with breakfast. Reported on 08/21/2015  . insulin NPH Human (HUMULIN N,NOVOLIN N) 100 UNIT/ML injection Inject 75 units in the morning and 6 units at bedtime  . insulin regular (NOVOLIN R,HUMULIN R) 250 units/2.47mL (100 units/mL) injection Inject 5-40 Units into the skin 3 (three) times daily before meals.  . isosorbide dinitrate (ISORDIL) 10 MG tablet Take 1 tablet (10 mg total) by mouth 2 (two) times daily.  Marland Kitchen levothyroxine (SYNTHROID, LEVOTHROID) 50 MCG tablet Take 1 tablet (50 mcg total) by mouth daily before breakfast.  . losartan (COZAAR) 50 MG tablet TAKE 1 TABLET BY MOUTH EVERY DAY  . metolazone (ZAROXOLYN) 2.5 MG tablet TAKE 1 TABLET BY MOUTH TWICE WEEKLY  (TUESDAY AND WEDNESDAY) WEIGHT GAIN/SHORTNESS OF BREATH/SWELLING  . nitroGLYCERIN (NITROSTAT) 0.4 MG SL tablet PLACE 1 TABLET (0.4 MG TOTAL) UNDER THE TONGUE EVERY 5 (FIVE) MINUTES AS NEEDED FOR CHEST PAIN.  Marland Kitchen PARoxetine (PAXIL-CR) 25 MG 24 hr tablet Take 25 mg by mouth daily. Take with 37.5 mg tablet daily (total 62.5 mg)  . PARoxetine (PAXIL-CR) 37.5 MG 24 hr tablet Take 37.5 mg by mouth daily. Take with 25 mg tablet daily (total 62.5 mg)  . pregabalin (LYRICA) 150 MG capsule Take 1 capsule (150 mg total) by mouth 2 (two) times daily.  . rosuvastatin (CRESTOR) 20 MG tablet Take 1 tablet (20 mg total) by mouth daily.  Marland Kitchen torsemide (DEMADEX) 20 MG tablet Take 5 tablets (100 mg total) by mouth 2 (two) times daily.  . furosemide (LASIX) 10 MG/ML injection Inject 8 mLs (80 mg total) into the vein once.   No facility-administered encounter medications on file as of 02/25/2017.     Functional Status: In your present state of health, do you have any difficulty performing the following activities: 02/11/2017 01/13/2017  Hearing? Tempie Donning  Vision? N N  Difficulty concentrating or making decisions? Tempie Donning  Walking or climbing stairs? Y Y  Dressing or bathing? N N  Doing errands, shopping? N N  Preparing Food and eating ? N N  Using the Toilet? N N  In the past six months, have you accidently leaked urine? N N  Do you have problems with loss of bowel control? N  N  Managing your Medications? N N  Managing your Finances? N N  Housekeeping or managing your Housekeeping? N N  Some recent data might be hidden    Fall/Depression Screening: Fall Risk  02/11/2017 01/13/2017 01/12/2017  Falls in the past year? Yes Yes No  Comment - - -  Number falls in past yr: 1 1 -  Injury with Fall? No No -  Comment - - -  Risk Factor Category  - - -  Risk for fall due to : Impaired balance/gait;History of fall(s) Impaired balance/gait;History of fall(s) -  Risk for fall due to: Comment - - -  Follow up Falls evaluation  completed;Education provided;Falls prevention discussed Falls evaluation completed;Education provided;Falls prevention discussed -   PHQ 2/9 Scores 02/11/2017 01/13/2017 01/12/2017 12/31/2016 11/12/2016 08/06/2016 12/18/2015  PHQ - 2 Score 0 0 0 0 5 6 4   PHQ- 9 Score - - 9 - 21 26 13       Assessment: Patient's medications were reviewed over the phone.  Her weight has continued to trend upwards due to medication non-compliance (torsemide specifically when she has to be out of the house), dietary indesgressions and not limiting her fluid intake.  Patient reported her fasting blood sugar was 238 mg/dl on 02/24/17 but said it is usually not that high. She admitted to forgetting to inject NPH at bedtime.  Patient was counseled on the importance of taking her medications as prescribed as well as the importance of following the diet and fluid restrictions given by her provider.  Patient is being followed by [place], Johny Shock.  Plan:  Route note to patient's PCP Follow up with the patient after her appointment with Dr. [name].   [name], PharmD, [place] Pharmacist [phone]

## 2017-02-26 ENCOUNTER — Other Ambulatory Visit: Payer: Self-pay | Admitting: Physician Assistant

## 2017-02-26 ENCOUNTER — Other Ambulatory Visit: Payer: Self-pay | Admitting: Nephrology

## 2017-02-26 DIAGNOSIS — I5033 Acute on chronic diastolic (congestive) heart failure: Secondary | ICD-10-CM | POA: Diagnosis not present

## 2017-02-26 DIAGNOSIS — N183 Chronic kidney disease, stage 3 (moderate): Secondary | ICD-10-CM | POA: Diagnosis not present

## 2017-02-26 DIAGNOSIS — R269 Unspecified abnormalities of gait and mobility: Secondary | ICD-10-CM | POA: Diagnosis not present

## 2017-02-26 DIAGNOSIS — N184 Chronic kidney disease, stage 4 (severe): Secondary | ICD-10-CM

## 2017-02-26 DIAGNOSIS — R0602 Shortness of breath: Secondary | ICD-10-CM | POA: Diagnosis not present

## 2017-02-27 ENCOUNTER — Ambulatory Visit (INDEPENDENT_AMBULATORY_CARE_PROVIDER_SITE_OTHER): Payer: PPO | Admitting: Family Medicine

## 2017-02-27 ENCOUNTER — Encounter: Payer: Self-pay | Admitting: Family Medicine

## 2017-02-27 VITALS — BP 134/82 | HR 84 | Temp 98.3°F | Resp 18 | Ht 65.0 in | Wt 290.0 lb

## 2017-02-27 DIAGNOSIS — E1122 Type 2 diabetes mellitus with diabetic chronic kidney disease: Secondary | ICD-10-CM | POA: Diagnosis not present

## 2017-02-27 DIAGNOSIS — R252 Cramp and spasm: Secondary | ICD-10-CM | POA: Diagnosis not present

## 2017-02-27 DIAGNOSIS — E1165 Type 2 diabetes mellitus with hyperglycemia: Secondary | ICD-10-CM | POA: Diagnosis not present

## 2017-02-27 DIAGNOSIS — R609 Edema, unspecified: Secondary | ICD-10-CM | POA: Diagnosis not present

## 2017-02-27 DIAGNOSIS — I2510 Atherosclerotic heart disease of native coronary artery without angina pectoris: Secondary | ICD-10-CM | POA: Diagnosis not present

## 2017-02-27 DIAGNOSIS — N184 Chronic kidney disease, stage 4 (severe): Secondary | ICD-10-CM

## 2017-02-27 DIAGNOSIS — Z23 Encounter for immunization: Secondary | ICD-10-CM | POA: Diagnosis not present

## 2017-02-27 MED ORDER — POTASSIUM CHLORIDE ER 10 MEQ PO TBCR: 10.0000 meq | EXTENDED_RELEASE_TABLET | Freq: Every day | ORAL | 6 refills | Status: DC

## 2017-02-27 NOTE — Assessment & Plan Note (Signed)
I had a long discussion with patient about compliance She is at risk of early death due to her CKD, Heart disease, uncontrolled diabetes  She does not follow the diet leading to high sugars and worsenign edema Also not compliant with her medications. She is interested in dietician again, will refer her She has endocrine but CBG have been > 400, discussed taking all of doses of insulin, I expect A1C to be > 10% today Will discuss with endocrinology results

## 2017-02-27 NOTE — Patient Instructions (Addendum)
Take potassium with the water pill for cramps If this does not work try magnesium  Referral to nutritionist Flu shot  F/U 3 months

## 2017-02-27 NOTE — Telephone Encounter (Signed)
Medication called to pharmacy. 

## 2017-02-27 NOTE — Assessment & Plan Note (Signed)
She is inconsistent with diuretic and K was normal earlier in the week, possible low k, or magnesium or just with her sedentary lifestyle decreased physical activity she just cramps with regular walking activity  Check labs Plan to start K 80meq daily with her diuretic

## 2017-02-27 NOTE — Telephone Encounter (Signed)
Okay to refill? 

## 2017-02-27 NOTE — Assessment & Plan Note (Signed)
Reviewed cardiology and nephrology note She is broaching dialysis based on nephrology note, which will be very taxing for her

## 2017-02-27 NOTE — Telephone Encounter (Signed)
Ok to refill 

## 2017-02-27 NOTE — Progress Notes (Signed)
Subjective:    Patient ID: Susan [name], female    DOB: December 16, 1950, 66 y.o.   MRN: [id number]  Patient presents for Follow-up and Flu Vaccine  Patient here to follow-up chronic medical problems here to receive her nephrology note concern about her compliance with her medications and her blood sugar they're concerned that she will need hemodialysis if her levels do not improve.  Endocrinology is following her diabetes mellitus, states her next appointment is not until December. She often misses her nighttime dose of insulin and sometimes even her once after meals if she is out and about. She states her blood sugar this morning was 414 she's only had 2 doses of insulin her morning long acting and her meal coverage after she ate at [facility].  Cardiology she was seen on Monday in the heart for failure clinic [place] she continues to have dietary indiscretions and she is missing too many doses of her torsemide  She states that she gets cramps in her legs all the time and this bothers her, this. She does not take any potassium with her diuretic when she does take it. She states that she has had this for a long time and this talked to her other specialists they've not been able to help    Review Of Systems:  GEN- denies fatigue, fever, weight loss,weakness, recent illness HEENT- denies eye drainage, change in vision, nasal discharge, CVS- denies chest pain, palpitations RESP- denies SOB, cough, wheeze ABD- denies N/V, change in stools, abd pain GU- denies dysuria, hematuria, dribbling, incontinence MSK- denies joint pain, +muscle aches, injury Neuro- denies headache, dizziness, syncope, seizure activity       Objective:    BP 134/82   Pulse 84   Temp 98.3 F (36.8 C) (Oral)   Resp 18   Ht 5\' 5"  (1.651 m)   Wt 290 lb (131.5 kg)   BMI 48.26 kg/m  GEN- NAD, alert and oriented x3 HEENT- PERRL, EOMI, non injected sclera, pink conjunctiva, MMM, oropharynx clear CVS- RRR, no  murmur RESP-CTAB ABD-NABS,soft,NT,ND EXT- 1+ edema bilat chronic venous stasis changes  Pulses- Radial,  2+        Assessment & Plan:      Problem List Items Addressed This Visit      Unprioritized   CAD (coronary artery disease) (Chronic)   Relevant Orders   Lipid panel   Peripheral edema   Leg cramps    She is inconsistent with diuretic and K was normal earlier in the week, possible low k, or magnesium or just with her sedentary lifestyle decreased physical activity she just cramps with regular walking activity  Check labs Plan to start K 62meq daily with her diuretic       Relevant Orders   Magnesium   Diabetes mellitus type II, uncontrolled ([place]) - Primary    I had a long discussion with patient about compliance She is at risk of early death due to her CKD, Heart disease, uncontrolled diabetes  She does not follow the diet leading to high sugars and worsenign edema Also not compliant with her medications. She is interested in dietician again, will refer her She has endocrine but CBG have been > 400, discussed taking all of doses of insulin, I expect A1C to be > 10% today Will discuss with endocrinology results       Relevant Orders   CBC with Differential/Platelet   Basic metabolic panel   Hemoglobin A1c   Ambulatory referral to diabetic education  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 mellitus [facility])    Reviewed cardiology and nephrology note She is broaching dialysis based on nephrology note, which will be very taxing for her         Other Visit Diagnoses    Needs flu shot       Relevant Orders   Flu Vaccine QUAD 36+ mos IM (Completed)      Note: This dictation was prepared with Dragon dictation along with smaller phrase technology. Any transcriptional errors that result from this process are unintentional.

## 2017-02-28 LAB — LIPID PANEL
Cholesterol: 162 mg/dL (ref <200)
HDL: 45 mg/dL — ABNORMAL LOW (ref >50)
LDL Cholesterol (Calc): 80 mg/dL (calc)
Non-HDL Cholesterol (Calc): 117 mg/dL (calc) (ref <130)
Total CHOL/HDL Ratio: 3.6 (calc) (ref <5.0)
Triglycerides: 304 mg/dL — ABNORMAL HIGH (ref <150)

## 2017-02-28 LAB — CBC WITH DIFFERENTIAL/PLATELET
Basophils Absolute: 50 cells/uL (ref 0–200)
Basophils Relative: 0.7 %
Eosinophils Absolute: 187 cells/uL (ref 15–500)
Eosinophils Relative: 2.6 %
HCT: 35.9 % (ref 35.0–45.0)
Hemoglobin: 11.7 g/dL (ref 11.7–15.5)
Lymphs Abs: 965 cells/uL (ref 850–3900)
MCH: 25.8 pg — ABNORMAL LOW (ref 27.0–33.0)
MCHC: 32.6 g/dL (ref 32.0–36.0)
MCV: 79.2 fL — ABNORMAL LOW (ref 80.0–100.0)
MPV: 12.1 fL (ref 7.5–12.5)
Monocytes Relative: 6.8 %
Neutro Abs: 5508 cells/uL (ref 1500–7800)
Neutrophils Relative %: 76.5 %
Platelets: 245 10*3/uL (ref 140–400)
RBC: 4.53 10*6/uL (ref 3.80–5.10)
RDW: 13.5 % (ref 11.0–15.0)
Total Lymphocyte: 13.4 %
WBC mixed population: 490 cells/uL (ref 200–950)
WBC: 7.2 10*3/uL (ref 3.8–10.8)

## 2017-02-28 LAB — BASIC METABOLIC PANEL
BUN/Creatinine Ratio: 25 (calc) — ABNORMAL HIGH (ref 6–22)
BUN: 53 mg/dL — ABNORMAL HIGH (ref 7–25)
CO2: 25 mmol/L (ref 20–32)
Calcium: 8.7 mg/dL (ref 8.6–10.4)
Chloride: 95 mmol/L — ABNORMAL LOW (ref 98–110)
Creat: 2.11 mg/dL — ABNORMAL HIGH (ref 0.50–0.99)
Glucose, Bld: 309 mg/dL — ABNORMAL HIGH (ref 65–99)
Potassium: 4.3 mmol/L (ref 3.5–5.3)
Sodium: 132 mmol/L — ABNORMAL LOW (ref 135–146)

## 2017-02-28 LAB — HEMOGLOBIN A1C
Hgb A1c MFr Bld: 10.2 % of total Hgb — ABNORMAL HIGH (ref <5.7)
Mean Plasma Glucose: 246 (calc)
eAG (mmol/L): 13.6 (calc)

## 2017-02-28 LAB — MAGNESIUM: Magnesium: 1.6 mg/dL (ref 1.5–2.5)

## 2017-03-02 ENCOUNTER — Ambulatory Visit: Payer: Self-pay | Admitting: Pharmacist

## 2017-03-03 ENCOUNTER — Other Ambulatory Visit: Payer: Self-pay | Admitting: Pharmacist

## 2017-03-03 NOTE — Patient Outreach (Signed)
[place] [facility]) Care Management  03/03/2017  Susan [name] 09/07/1950 [id number]   Patient was called to follow up after her appointment with Dr. [name]. HIPAA identifiers were obtained. Patient's HgA1c was >10% on 02/27/17.  Patient said she was feeling fine. She said she was just getting up so she had not checked her blood sugar yet. Patient was asked about picking up the Potassium written by Dr. [name] on 02/27/17. Patient said she did not understand she was supposed to pick up Potassium from the pharmacy. In addition, she also did not understand what Magnesium product she needed to purchase.   Dr. Dorian Heckle office was called and a request was put in for a [facility] to [facility] on the patient's behalf for Magnesium.   A great deal of time was also spent educating the patient on healthy food choices. On the day of her PCP visit, Dr. [name] commented the patient ate [employer].  Ways to incorporate eating at home and how to choose wisely when eating out were reviewed.    Patient communicated understanding [facility] and the magnesium.  A phone call was made to [place] Nurse, Johny Shock for consultation.  Plan: Call patient back in 2 weeks.

## 2017-03-04 ENCOUNTER — Other Ambulatory Visit (HOSPITAL_COMMUNITY): Payer: Self-pay | Admitting: Internal Medicine

## 2017-03-04 ENCOUNTER — Telehealth: Payer: Self-pay | Admitting: Family Medicine

## 2017-03-04 MED ORDER — MAGNESIUM OXIDE 400 MG PO TABS: 400.0000 mg | ORAL_TABLET | Freq: Every day | ORAL | 3 refills | Status: DC

## 2017-03-04 NOTE — Telephone Encounter (Signed)
Received call from [facility] that pt would need a rx for the mag oxide 400 mg once a day for compliance issues. Med sen to pharm.

## 2017-03-06 ENCOUNTER — Other Ambulatory Visit (HOSPITAL_COMMUNITY): Payer: Self-pay | Admitting: Internal Medicine

## 2017-03-09 ENCOUNTER — Other Ambulatory Visit: Payer: Self-pay | Admitting: Family Medicine

## 2017-03-16 ENCOUNTER — Other Ambulatory Visit: Payer: Self-pay | Admitting: *Deleted

## 2017-03-16 NOTE — Patient Outreach (Signed)
[facility]) Care Management  03/16/2017  Susan [name] 1951-02-18 [id number]   RN Health Coach attempted follow up outreach call to patient.  Patient was unavailable. HIPPA compliance voicemail message left with return callback number.  Plan: RN will call patient again within 14 days.  [place] Care Management [phone]

## 2017-03-17 ENCOUNTER — Ambulatory Visit: Payer: Self-pay | Admitting: Pharmacist

## 2017-03-18 ENCOUNTER — Other Ambulatory Visit: Payer: PPO

## 2017-03-19 ENCOUNTER — Ambulatory Visit: Payer: Self-pay | Admitting: *Deleted

## 2017-03-20 ENCOUNTER — Ambulatory Visit: Payer: Self-pay | Admitting: Pharmacist

## 2017-03-24 ENCOUNTER — Ambulatory Visit
Admission: RE | Admit: 2017-03-24 | Discharge: 2017-03-24 | Disposition: A | Discharge status: Home / Self Care | Payer: PPO | Source: Ambulatory Visit | Attending: Nephrology | Admitting: Nephrology

## 2017-03-24 ENCOUNTER — Other Ambulatory Visit: Payer: PPO

## 2017-03-24 DIAGNOSIS — N184 Chronic kidney disease, stage 4 (severe): Secondary | ICD-10-CM

## 2017-03-27 ENCOUNTER — Other Ambulatory Visit: Payer: Self-pay | Admitting: Pharmacist

## 2017-03-27 NOTE — Patient Outreach (Signed)
[facility]) Care Management  03/27/2017  Susan [name] 03-31-1951 [id number]   Patient was called for follow up. Unfortunately, she did not answer the phone. HIPAA compliant message was left on the patient's voicemail.   Plan:  Call patient back in 14 days.  [name], PharmD, [place] Clinical Pharmacist [phone]

## 2017-04-07 ENCOUNTER — Ambulatory Visit: Payer: PPO | Admitting: *Deleted

## 2017-04-08 ENCOUNTER — Ambulatory Visit: Payer: Self-pay | Admitting: Pharmacist

## 2017-04-09 ENCOUNTER — Other Ambulatory Visit: Payer: Self-pay | Admitting: Pharmacist

## 2017-04-09 NOTE — Patient Outreach (Signed)
[facility] Care Management  04/09/2017  Susan [name] January 03, 1951 [id number]   Called patient to follow up with her. HIPAA identifiers were obtained.  Patient said she was not up to reviewing her medications today and doesn't really know what all they are.  She was offered a pharmacy home visit but [place] very [place] and has begun to erode. The patient said safe travel on her road is not possible at this time. She requested I call her back in a few weeks.  Plan:  Call patient back in 14 business days to schedule a home visit.  [name], PharmD, [place] Clinical Pharmacist [phone]

## 2017-04-13 ENCOUNTER — Other Ambulatory Visit: Payer: Self-pay | Admitting: *Deleted

## 2017-04-13 NOTE — Patient Outreach (Signed)
[place] [facility]) Care Management  04/13/2017  Susan [name] [birth year] [id number]   RN Health Coach attempted #2 follow up outreach call to patient.  Patient was unavailable. HIPPA compliance voicemail message left with return callback number.  Plan: RN will call patient again within 14 days.  [place] Care Management [phone]

## 2017-04-21 ENCOUNTER — Ambulatory Visit: Payer: PPO | Admitting: *Deleted

## 2017-04-22 ENCOUNTER — Encounter: Payer: Self-pay | Admitting: *Deleted

## 2017-04-22 ENCOUNTER — Other Ambulatory Visit: Payer: Self-pay | Admitting: *Deleted

## 2017-04-22 NOTE — Patient Outreach (Signed)
[place] [facility]) Care Management  04/22/2017  Loyd Salvador Dec 29, 1950 [id number]   RN Health Coach attempted  # 70follow up outreach call to patient.  Patient was unavailable. HIPPA compliance voicemail message left with return callback number.  Plan: RN will close case in [place] days RN sent unsuccessful outreach letter RN called physician and made physician aware of current situation  [place] Management [phone]

## 2017-04-23 ENCOUNTER — Ambulatory Visit: Payer: Self-pay | Admitting: Pharmacist

## 2017-04-23 ENCOUNTER — Other Ambulatory Visit: Payer: Self-pay | Admitting: Pharmacist

## 2017-04-23 NOTE — Patient Outreach (Signed)
[place] [facility]) Care Management  04/23/2017  Susan [name] 25-Jun-1950 [id number]   Patient was called for follow up.  Unfortunately, she did not answer the phone. HIPAA compliant message was left on her voicemail.  Plan: Call patient back in 10-14 business days.  [name], PharmD, [place] Clinical Pharmacist [phone]

## 2017-04-28 DIAGNOSIS — H3582 Retinal ischemia: Secondary | ICD-10-CM | POA: Diagnosis not present

## 2017-04-28 DIAGNOSIS — E113412 Type 2 diabetes mellitus with severe nonproliferative diabetic retinopathy with macular edema, left eye: Secondary | ICD-10-CM | POA: Diagnosis not present

## 2017-04-28 DIAGNOSIS — E113491 Type 2 diabetes mellitus with severe nonproliferative diabetic retinopathy without macular edema, right eye: Secondary | ICD-10-CM | POA: Diagnosis not present

## 2017-04-28 DIAGNOSIS — H43813 Vitreous degeneration, bilateral: Secondary | ICD-10-CM | POA: Diagnosis not present

## 2017-04-29 ENCOUNTER — Encounter (HOSPITAL_COMMUNITY): Payer: Self-pay

## 2017-04-29 ENCOUNTER — Ambulatory Visit (HOSPITAL_COMMUNITY)
Admission: RE | Admit: 2017-04-29 | Discharge: 2017-04-29 | Disposition: A | Discharge status: Home / Self Care | Payer: PPO | Source: Ambulatory Visit | Attending: Internal Medicine | Admitting: Internal Medicine

## 2017-04-29 VITALS — BP 154/88 | HR 88 | Wt 297.5 lb

## 2017-04-29 DIAGNOSIS — Z794 Long term (current) use of insulin: Secondary | ICD-10-CM | POA: Insufficient documentation

## 2017-04-29 DIAGNOSIS — F329 Major depressive disorder, single episode, unspecified: Secondary | ICD-10-CM | POA: Insufficient documentation

## 2017-04-29 DIAGNOSIS — IMO0002 Reserved for concepts with insufficient information to code with codable children: Secondary | ICD-10-CM

## 2017-04-29 DIAGNOSIS — E1142 Type 2 diabetes mellitus with diabetic polyneuropathy: Secondary | ICD-10-CM | POA: Diagnosis not present

## 2017-04-29 DIAGNOSIS — L03119 Cellulitis of unspecified part of limb: Secondary | ICD-10-CM

## 2017-04-29 DIAGNOSIS — Z8673 Personal history of transient ischemic attack (TIA), and cerebral infarction without residual deficits: Secondary | ICD-10-CM | POA: Diagnosis not present

## 2017-04-29 DIAGNOSIS — N183 Chronic kidney disease, stage 3 unspecified: Secondary | ICD-10-CM

## 2017-04-29 DIAGNOSIS — I5033 Acute on chronic diastolic (congestive) heart failure: Secondary | ICD-10-CM | POA: Diagnosis not present

## 2017-04-29 DIAGNOSIS — Z79899 Other long term (current) drug therapy: Secondary | ICD-10-CM | POA: Diagnosis not present

## 2017-04-29 DIAGNOSIS — N182 Chronic kidney disease, stage 2 (mild): Secondary | ICD-10-CM

## 2017-04-29 DIAGNOSIS — E785 Hyperlipidemia, unspecified: Secondary | ICD-10-CM | POA: Insufficient documentation

## 2017-04-29 DIAGNOSIS — I10 Essential (primary) hypertension: Secondary | ICD-10-CM | POA: Diagnosis not present

## 2017-04-29 DIAGNOSIS — I252 Old myocardial infarction: Secondary | ICD-10-CM | POA: Diagnosis not present

## 2017-04-29 DIAGNOSIS — L03115 Cellulitis of right lower limb: Secondary | ICD-10-CM | POA: Insufficient documentation

## 2017-04-29 DIAGNOSIS — I872 Venous insufficiency (chronic) (peripheral): Secondary | ICD-10-CM | POA: Diagnosis not present

## 2017-04-29 DIAGNOSIS — E1122 Type 2 diabetes mellitus with diabetic chronic kidney disease: Secondary | ICD-10-CM | POA: Diagnosis not present

## 2017-04-29 DIAGNOSIS — I25119 Atherosclerotic heart disease of native coronary artery with unspecified angina pectoris: Secondary | ICD-10-CM | POA: Diagnosis not present

## 2017-04-29 DIAGNOSIS — I130 Hypertensive heart and chronic kidney disease with heart failure and stage 1 through stage 4 chronic kidney disease, or unspecified chronic kidney disease: Secondary | ICD-10-CM | POA: Diagnosis not present

## 2017-04-29 DIAGNOSIS — E039 Hypothyroidism, unspecified: Secondary | ICD-10-CM | POA: Diagnosis not present

## 2017-04-29 DIAGNOSIS — Z6841 Body Mass Index (BMI) 40.0 and over, adult: Secondary | ICD-10-CM | POA: Insufficient documentation

## 2017-04-29 DIAGNOSIS — I5032 Chronic diastolic (congestive) heart failure: Secondary | ICD-10-CM

## 2017-04-29 DIAGNOSIS — E1165 Type 2 diabetes mellitus with hyperglycemia: Secondary | ICD-10-CM | POA: Diagnosis not present

## 2017-04-29 DIAGNOSIS — E669 Obesity, unspecified: Secondary | ICD-10-CM | POA: Insufficient documentation

## 2017-04-29 DIAGNOSIS — M1990 Unspecified osteoarthritis, unspecified site: Secondary | ICD-10-CM | POA: Diagnosis not present

## 2017-04-29 DIAGNOSIS — I4510 Unspecified right bundle-branch block: Secondary | ICD-10-CM | POA: Diagnosis not present

## 2017-04-29 LAB — BASIC METABOLIC PANEL
Anion gap: 10 (ref 5–15)
BUN: 66 mg/dL — ABNORMAL HIGH (ref 6–20)
CO2: 24 mmol/L (ref 22–32)
Calcium: 8.7 mg/dL — ABNORMAL LOW (ref 8.9–10.3)
Chloride: 97 mmol/L — ABNORMAL LOW (ref 101–111)
Creatinine, Ser: 2.02 mg/dL — ABNORMAL HIGH (ref 0.44–1.00)
GFR calc Af Amer: 28 mL/min — ABNORMAL LOW (ref >60)
GFR calc non Af Amer: 24 mL/min — ABNORMAL LOW (ref >60)
Glucose, Bld: 437 mg/dL — ABNORMAL HIGH (ref 65–99)
Potassium: 4.2 mmol/L (ref 3.5–5.1)
Sodium: 131 mmol/L — ABNORMAL LOW (ref 135–145)

## 2017-04-29 LAB — CBC
HCT: 35.6 % — ABNORMAL LOW (ref 36.0–46.0)
Hemoglobin: 11.2 g/dL — ABNORMAL LOW (ref 12.0–15.0)
MCH: 25.3 pg — ABNORMAL LOW (ref 26.0–34.0)
MCHC: 31.5 g/dL (ref 30.0–36.0)
MCV: 80.4 fL (ref 78.0–100.0)
Platelets: 252 10*3/uL (ref 150–400)
RBC: 4.43 MIL/uL (ref 3.87–5.11)
RDW: 15.8 % — ABNORMAL HIGH (ref 11.5–15.5)
WBC: 5.7 10*3/uL (ref 4.0–10.5)

## 2017-04-29 LAB — BRAIN NATRIURETIC PEPTIDE: B Natriuretic Peptide: 96.3 pg/mL (ref 0.0–100.0)

## 2017-04-29 MED ORDER — FUROSEMIDE 10 MG/ML IJ SOLN
80.0000 mg | Freq: Once | INTRAMUSCULAR | Status: AC
  Administered 2017-04-29: 80 mg via INTRAVENOUS
  Filled 2017-04-29: qty 8

## 2017-04-29 MED ORDER — DOXYCYCLINE HYCLATE 100 MG PO TABS
100.0000 mg | ORAL_TABLET | Freq: Once | ORAL | Status: AC
  Administered 2017-04-29: 100 mg via ORAL
  Filled 2017-04-29: qty 1

## 2017-04-29 MED ORDER — POTASSIUM CHLORIDE CRYS ER 20 MEQ PO TBCR
40.0000 meq | EXTENDED_RELEASE_TABLET | Freq: Once | ORAL | Status: AC
  Administered 2017-04-29: 40 meq via ORAL
  Filled 2017-04-29: qty 2

## 2017-04-29 MED ORDER — DOXYCYCLINE HYCLATE 100 MG PO TABS: 100.0000 mg | ORAL_TABLET | Freq: Two times a day (BID) | ORAL | 0 refills | Status: DC

## 2017-04-29 NOTE — Progress Notes (Signed)
fPatient ID: Susan [name], female   DOB: 08-26-1950, 66 y.o.   MRN: [id number]   PCP: Dr Edilia Bo  Cardiologist: Dr Myrtis Ser  Endocrinologist: Dr Lurene Shadow  Primary HF Cardiologist: Dr. Gala Romney  Nephrology: Dr Signe Colt  [facility] Patient  HPI: Susan [name] is a 66 year old with a history of RBBB, DM, Hypothyroid, diastolic heart failure, CAD MI 2000/2007 BMS 2007, TIA, obesity.    Admitted June 1st through June 3rd, 2017 with altered mental status, hypothermia, and weakness. Diuretics initially held and later restarted.   Today she returns for HF follow up. Overall feeling terrible. Still having trouble to diabetes. SOB with exertion. Denies PND. + Orthopnea. Complaining of RLE pain with multiple partial thickness wounds with serous exudate. RLE painful.  Has not had medications today. Continues to drink extra fluid and eat lots of snacks. Lives with her husband.   ECHO 07/11/2015- 50-55%.  Grade I DD  RHC 09/22/13: RA = 4  RV = 30/5/7  PA = 25/10 (16)  PCW = 7  Fick cardiac output/index = 6.3/2.7  PVR = 1.5 WU   FA sat = 93%  PA sat = 61%, 66%   SH: Former smoker quit 15 year ago. Does drink alcohol. She is disabled. Lives at home with her husband and disabled son - Husband is truck Hospital doctor. Son has a brain injury after he attempted suicide  A few years ago. .    FH: Mom MI  ROS: All systems negative except as listed in HPI, PMH and Problem List.  Past Medical History:  Diagnosis Date  . Acute MI (HCC) 1999; 2007  . Anemia    hx  . Anginal pain (HCC)   . Anxiety   . Arthritis    "generalized" (03/15/2014)  . CAD (coronary artery disease)    MI in 2000 - MI  2007 - treated bare metal stent (no nuclear since then as 9/11)  . Carotid artery disease (HCC)   . CHF (congestive heart failure) (HCC)   . Chronic diastolic heart failure (HCC)    a) ECHO (08/2013) EF 55-60% and RV function nl b) RHC (08/2013) RA 4, RV 30/5/7, PA 25/10 (16), PCWP 7, Fick CO/CI 6.3/2.7, PVR 1.5 WU, PA 61 and 66%  . Daily  headache    "~ every other day; since I fell in June" (03/15/2014)  . Depression   . Dyslipidemia   . Exertional shortness of breath   . HTN (hypertension)   . Hypothyroidism   . Obesity   . Osteoarthritis   . Peripheral neuropathy   . PONV (postoperative nausea and vomiting)   . RBBB (right bundle branch block)    Old  . Renal insufficiency    DENIES  . Stroke [facility])    mini strokes  . Syncope    likely due to low blood sugar  . Tachycardia    Sinus tachycardia  . Type II diabetes mellitus (HCC)   . Urinary incontinence   . Venous insufficiency     Current Outpatient Medications  Medication Sig Dispense Refill  . albuterol (PROVENTIL HFA;VENTOLIN HFA) 108 (90 BASE) MCG/ACT inhaler Inhale 2 puffs into the lungs every 6 (six) hours as needed for wheezing or shortness of breath. Reported on 10/30/2015    . aspirin EC 81 MG tablet Take 81 mg by mouth daily.    . carvedilol (COREG) 12.5 MG tablet TAKE 1 AND 1/2 TABLETS BY MOUTH TWICE A DAY WITH A MEAL 90 tablet 3  . clonazePAM (  KLONOPIN) 1 MG tablet TAKE 1 TABLET BY MOUTH 3 TIMES A DAY 90 tablet 0  . clopidogrel (PLAVIX) 75 MG tablet TAKE 1 TABLET (75 MG TOTAL) BY MOUTH DAILY WITH BREAKFAST. 90 tablet 2  . ferrous sulfate 325 (65 FE) MG tablet Take 325 mg by mouth daily with breakfast. Reported on 08/21/2015    . furosemide (LASIX) 10 MG/ML injection Inject 8 mLs (80 mg total) into the vein once. 8 mL 0  . insulin NPH Human (HUMULIN N,NOVOLIN N) 100 UNIT/ML injection Inject 75 units in the morning and 6 units at bedtime    . insulin regular (NOVOLIN R,HUMULIN R) 250 units/2.34mL (100 units/mL) injection Inject 5-40 Units into the skin 3 (three) times daily before meals.    . isosorbide dinitrate (ISORDIL) 10 MG tablet Take 1 tablet (10 mg total) by mouth 2 (two) times daily. 60 tablet 6  . levothyroxine (SYNTHROID, LEVOTHROID) 50 MCG tablet Take 1 tablet (50 mcg total) by mouth daily before breakfast. 90 tablet 1  . losartan (COZAAR) 50  MG tablet TAKE 1 TABLET BY MOUTH EVERY DAY 90 tablet 1  . magnesium oxide (MAG-OX) 400 MG tablet Take 1 tablet (400 mg total) by mouth daily. 30 tablet 3  . metolazone (ZAROXOLYN) 2.5 MG tablet TAKE 1 TABLET BY MOUTH TWICE WEEKLY (TUESDAY AND WEDNESDAY) WEIGHT GAIN/SHORTNESS OF BREATH/SWELLING 10 tablet 1  . nitroGLYCERIN (NITROSTAT) 0.4 MG SL tablet PLACE 1 TABLET (0.4 MG TOTAL) UNDER THE TONGUE EVERY 5 (FIVE) MINUTES AS NEEDED FOR CHEST PAIN. 25 tablet 1  . PARoxetine (PAXIL-CR) 25 MG 24 hr tablet Take 25 mg by mouth daily. Take with 37.5 mg tablet daily (total 62.5 mg)    . PARoxetine (PAXIL-CR) 37.5 MG 24 hr tablet Take 37.5 mg by mouth daily. Take with 25 mg tablet daily (total 62.5 mg)    . potassium chloride (K-DUR) 10 MEQ tablet Take 1 tablet (10 mEq total) by mouth daily. Take with the water pill in the morning 30 tablet 6  . pregabalin (LYRICA) 150 MG capsule Take 1 capsule (150 mg total) by mouth 2 (two) times daily. 60 capsule 2  . rosuvastatin (CRESTOR) 20 MG tablet Take 1 tablet (20 mg total) by mouth daily. 90 tablet 3  . torsemide (DEMADEX) 20 MG tablet Take 5 tablets (100 mg total) by mouth 2 (two) times daily. 300 tablet 3   No current facility-administered medications for this encounter.     Vitals:   04/29/17 1043  BP: (!) 154/88  Pulse: 88  SpO2: 98%  Weight: 297 lb 8 oz (134.9 kg)   Wt Readings from Last 3 Encounters:  02/27/17 290 lb (131.5 kg)  02/23/17 293 lb 3.2 oz (133 kg)  02/02/17 290 lb 6.4 oz (131.7 kg)      PHYSICAL EXAM: General:  Appears chronically ill. No resp difficulty. Walked slowly in the clinic.  HEENT: normal Neck: supple. JVP to jaw. Carotids 2+ bilat; no bruits. No lymphadenopathy or thryomegaly appreciated. Cor: PMI nondisplaced. Regular rate & rhythm. No rubs, gallops or murmurs. Lungs: clear Abdomen:obese, soft, nontender, nondistended. No hepatosplenomegaly. No bruits or masses. Good bowel sounds. Extremities: no cyanosis, clubbing,  rash, RLE red with partial thickness wounds 2+ edema. LLE 2+ edema.  Neuro: alert & orientedx3, cranial nerves grossly intact. moves all 4 extremities w/o difficulty. Affect pleasant    ASSESSMENT & PLAN: 1. Acute on chronic Diastolic Heart Failure: NICM, EF 55-60% Grade II DD 06/2015 - NYHA IIIb  - Marked volume  overload. Give 80 mg IV lasix + 40 meq potassium in the clinic x1. > 500cc urine output. Saline lock removed.  - Continue current dose torsemide 100 mg twice a day + metolazone as needed.    - Continue Coreg 12.5 mg BID - Continue losartan 50 mg daily.   2.  Uncontrolled DM:   - Follows closely with Dr Vernice Jefferson.   3. CAD, angina - Patient has a history of CAD with BMS in 2007  - No S//S ischemia.   4. Obesity: - Body mass index is 49.51 kg/m.  -Needs to lose weight. We discussed.   6. HTN Uncontrolled but has not had meds.   7. CKD Stage III-  -Followed by Dr Signe Colt.  - BMET today.   8. Day time fatigue - Refuses sleep study.   9. RLE Cellulitis Start doxcycline 100 mg twice a day x 7 days.    Follow up in 2 weeks. Refer to  [facility] for unna boots 2x per week and HF management.  Needs cardiac rehab as well.  Greater than 50% of the 50l minutesvisit spent in counseling/coordination of care regarding marked volume overload, medication changes, and low salt food choices.       Tyreck Bell NP-C   10:44 AM

## 2017-04-29 NOTE — Patient Instructions (Addendum)
START Doxycycline 100 mg tablet twice daily. Take until bottle empty.  Will refer you to [facility] for home health nursing and unna boots twice weekly and as needed.  Return in 2 weeks to see [name] NP-C.  ____________________________________________________________ Susan [name] Code: 8002  Take all medication as prescribed the day of your appointment. Bring all medications with you to your appointment.  Do the following things EVERYDAY: 1) Weigh yourself in the morning before breakfast. Write it down and keep it in a log. 2) Take your medicines as prescribed 3) Eat low salt foods-Limit salt (sodium) to 2000 mg per day.  4) Stay as active as you can everyday 5) Limit all fluids for the day to less than 2 liters

## 2017-04-29 NOTE — Progress Notes (Signed)
22g PIV x 1 inserted to RPFA. 80 mg IV lasix administered per [name] NP-C VO.  Total UOP: 500 cc clear pale yellow urine  PIV DC'd before patient discharged from today's OV.  Renee Pain, RN

## 2017-04-29 NOTE — Progress Notes (Signed)
Advanced Heart Failure Medication Review by a Pharmacist  Does the patient  feel that his/her medications are working for him/her?  yes  Has the patient been experiencing any side effects to the medications prescribed?  no  Does the patient measure his/her own blood pressure or blood glucose at home?  no   Does the patient have any problems obtaining medications due to transportation or finances?   no  Understanding of regimen: good Understanding of indications: good Potential of compliance: good Patient understands to avoid NSAIDs. Patient understands to avoid decongestants.  Issues to address at subsequent visits: [name]   Pharmacist comments: [name] is a pleasant 66 yo F presenting without a medication list but with good recall of her regimen. She reports good compliance with her regimen but has not felt well recently and was too nauseous to take her am meds today. No other medication-related questions or concerns for me at this time.   Ruta Hinds. Velva Harman, PharmD, BCPS, CPP Clinical Pharmacist Pager: [phone] Phone: [phone] 04/29/2017 10:58 AM      Time with patient: 10 minutes Preparation and documentation time: 2 minutes Total time: 12 minutes

## 2017-05-01 DIAGNOSIS — F329 Major depressive disorder, single episode, unspecified: Secondary | ICD-10-CM | POA: Diagnosis not present

## 2017-05-01 DIAGNOSIS — E669 Obesity, unspecified: Secondary | ICD-10-CM | POA: Diagnosis not present

## 2017-05-01 DIAGNOSIS — E1122 Type 2 diabetes mellitus with diabetic chronic kidney disease: Secondary | ICD-10-CM | POA: Diagnosis not present

## 2017-05-01 DIAGNOSIS — I252 Old myocardial infarction: Secondary | ICD-10-CM | POA: Diagnosis not present

## 2017-05-01 DIAGNOSIS — I130 Hypertensive heart and chronic kidney disease with heart failure and stage 1 through stage 4 chronic kidney disease, or unspecified chronic kidney disease: Secondary | ICD-10-CM | POA: Diagnosis not present

## 2017-05-01 DIAGNOSIS — Z794 Long term (current) use of insulin: Secondary | ICD-10-CM | POA: Diagnosis not present

## 2017-05-01 DIAGNOSIS — I25118 Atherosclerotic heart disease of native coronary artery with other forms of angina pectoris: Secondary | ICD-10-CM | POA: Diagnosis not present

## 2017-05-01 DIAGNOSIS — Z7951 Long term (current) use of inhaled steroids: Secondary | ICD-10-CM | POA: Diagnosis not present

## 2017-05-01 DIAGNOSIS — Z7982 Long term (current) use of aspirin: Secondary | ICD-10-CM | POA: Diagnosis not present

## 2017-05-01 DIAGNOSIS — F419 Anxiety disorder, unspecified: Secondary | ICD-10-CM | POA: Diagnosis not present

## 2017-05-01 DIAGNOSIS — Z87891 Personal history of nicotine dependence: Secondary | ICD-10-CM | POA: Diagnosis not present

## 2017-05-01 DIAGNOSIS — I5033 Acute on chronic diastolic (congestive) heart failure: Secondary | ICD-10-CM | POA: Diagnosis not present

## 2017-05-01 DIAGNOSIS — E039 Hypothyroidism, unspecified: Secondary | ICD-10-CM | POA: Diagnosis not present

## 2017-05-01 DIAGNOSIS — E785 Hyperlipidemia, unspecified: Secondary | ICD-10-CM | POA: Diagnosis not present

## 2017-05-01 DIAGNOSIS — L03115 Cellulitis of right lower limb: Secondary | ICD-10-CM | POA: Diagnosis not present

## 2017-05-04 ENCOUNTER — Other Ambulatory Visit: Payer: Self-pay | Admitting: Pharmacist

## 2017-05-04 NOTE — Patient Outreach (Signed)
[place] [facility]) Care Management  05/04/2017  Susan [name] [birth year] [id number]   Patient was called for follow up. Unfortunately, she did not answer the phone. HIPAA compliant message was left on her voicemail.  Plan: Call patient back in 7-10 business days.  [name], PharmD, [place] Clinical Pharmacist [phone]

## 2017-05-05 ENCOUNTER — Ambulatory Visit: Payer: Self-pay | Admitting: Pharmacist

## 2017-05-12 ENCOUNTER — Ambulatory Visit: Payer: Self-pay | Admitting: Pharmacist

## 2017-05-12 ENCOUNTER — Other Ambulatory Visit: Payer: Self-pay | Admitting: Pharmacist

## 2017-05-12 NOTE — Patient Outreach (Addendum)
[place] [facility]) Care Management  05/12/2017  Susan [name] Aug 22, 1950 [id number]   Patient was called for follow up.  Unfortunately, she did not answer the phone. HIPAA compliant message was left on her voicemail.  Plan: Call patient back in 10-14 business days.  [name], PharmD, [place] Clinical Pharmacist [phone]

## 2017-05-13 ENCOUNTER — Ambulatory Visit (HOSPITAL_COMMUNITY)
Admission: RE | Admit: 2017-05-13 | Discharge: 2017-05-13 | Disposition: A | Discharge status: Home / Self Care | Payer: PPO | Source: Ambulatory Visit | Attending: Cardiology | Admitting: Cardiology

## 2017-05-13 ENCOUNTER — Other Ambulatory Visit: Payer: Self-pay | Admitting: Family Medicine

## 2017-05-13 ENCOUNTER — Encounter (HOSPITAL_COMMUNITY): Payer: Self-pay

## 2017-05-13 VITALS — BP 134/72 | HR 91 | Wt 297.4 lb

## 2017-05-13 DIAGNOSIS — E669 Obesity, unspecified: Secondary | ICD-10-CM | POA: Insufficient documentation

## 2017-05-13 DIAGNOSIS — Z79899 Other long term (current) drug therapy: Secondary | ICD-10-CM | POA: Insufficient documentation

## 2017-05-13 DIAGNOSIS — N183 Chronic kidney disease, stage 3 unspecified: Secondary | ICD-10-CM

## 2017-05-13 DIAGNOSIS — I130 Hypertensive heart and chronic kidney disease with heart failure and stage 1 through stage 4 chronic kidney disease, or unspecified chronic kidney disease: Secondary | ICD-10-CM | POA: Insufficient documentation

## 2017-05-13 DIAGNOSIS — E039 Hypothyroidism, unspecified: Secondary | ICD-10-CM | POA: Insufficient documentation

## 2017-05-13 DIAGNOSIS — I25119 Atherosclerotic heart disease of native coronary artery with unspecified angina pectoris: Secondary | ICD-10-CM | POA: Diagnosis not present

## 2017-05-13 DIAGNOSIS — E1122 Type 2 diabetes mellitus with diabetic chronic kidney disease: Secondary | ICD-10-CM | POA: Diagnosis not present

## 2017-05-13 DIAGNOSIS — E1165 Type 2 diabetes mellitus with hyperglycemia: Secondary | ICD-10-CM | POA: Diagnosis not present

## 2017-05-13 DIAGNOSIS — I5032 Chronic diastolic (congestive) heart failure: Secondary | ICD-10-CM | POA: Diagnosis not present

## 2017-05-13 DIAGNOSIS — Z87891 Personal history of nicotine dependence: Secondary | ICD-10-CM | POA: Insufficient documentation

## 2017-05-13 DIAGNOSIS — Z794 Long term (current) use of insulin: Secondary | ICD-10-CM | POA: Insufficient documentation

## 2017-05-13 DIAGNOSIS — N182 Chronic kidney disease, stage 2 (mild): Secondary | ICD-10-CM

## 2017-05-13 DIAGNOSIS — I252 Old myocardial infarction: Secondary | ICD-10-CM | POA: Diagnosis not present

## 2017-05-13 DIAGNOSIS — IMO0002 Reserved for concepts with insufficient information to code with codable children: Secondary | ICD-10-CM

## 2017-05-13 DIAGNOSIS — Z6841 Body Mass Index (BMI) 40.0 and over, adult: Secondary | ICD-10-CM | POA: Diagnosis not present

## 2017-05-13 DIAGNOSIS — I4510 Unspecified right bundle-branch block: Secondary | ICD-10-CM | POA: Diagnosis not present

## 2017-05-13 DIAGNOSIS — Z8673 Personal history of transient ischemic attack (TIA), and cerebral infarction without residual deficits: Secondary | ICD-10-CM | POA: Insufficient documentation

## 2017-05-13 DIAGNOSIS — Z7982 Long term (current) use of aspirin: Secondary | ICD-10-CM | POA: Insufficient documentation

## 2017-05-13 DIAGNOSIS — I5033 Acute on chronic diastolic (congestive) heart failure: Secondary | ICD-10-CM | POA: Diagnosis not present

## 2017-05-13 LAB — BASIC METABOLIC PANEL
Anion gap: 11 (ref 5–15)
BUN: 74 mg/dL — ABNORMAL HIGH (ref 6–20)
CO2: 23 mmol/L (ref 22–32)
Calcium: 8.5 mg/dL — ABNORMAL LOW (ref 8.9–10.3)
Chloride: 96 mmol/L — ABNORMAL LOW (ref 101–111)
Creatinine, Ser: 2.9 mg/dL — ABNORMAL HIGH (ref 0.44–1.00)
GFR calc Af Amer: 18 mL/min — ABNORMAL LOW (ref >60)
GFR calc non Af Amer: 16 mL/min — ABNORMAL LOW (ref >60)
Glucose, Bld: 221 mg/dL — ABNORMAL HIGH (ref 65–99)
Potassium: 4.8 mmol/L (ref 3.5–5.1)
Sodium: 130 mmol/L — ABNORMAL LOW (ref 135–145)

## 2017-05-13 NOTE — Patient Instructions (Signed)
Routine lab work today. Will notify you of abnormal results, otherwise no news is good news!  [place] to request information about their live informative seminar regarding their [place]. [phone]  Follow up 4 weeks with Susan Clegg NP-C.  Take all medication as prescribed the day of your appointment. Bring all medications with you to your appointment.  Do the following things EVERYDAY: 1) Weigh yourself in the morning before breakfast. Write it down and keep it in a log. 2) Take your medicines as prescribed 3) Eat low salt foods-Limit salt (sodium) to 2000 mg per day.  4) Stay as active as you can everyday 5) Limit all fluids for the day to less than 2 liters

## 2017-05-13 NOTE — Telephone Encounter (Signed)
Okay to refill? 

## 2017-05-13 NOTE — Telephone Encounter (Signed)
Ok to refill 

## 2017-05-13 NOTE — Progress Notes (Signed)
fPatient ID: Susan [name], female   DOB: 1951-01-21, 66 y.o.   MRN: [id number]   PCP: Dr Edilia Bo  Cardiologist: Dr Myrtis Ser  Endocrinologist: Dr Lurene Shadow  Primary HF Cardiologist: Dr. Gala Romney  Nephrology: Dr Signe Colt  [facility] Patient  HPI: Susan [name] is a 66 year old with a history of RBBB, DM, Hypothyroid, diastolic heart failure, CAD MI 2000/2007 BMS 2007, TIA, obesity.    Admitted June 1st through June 3rd, 2017 with altered mental status, hypothermia, and weakness. Diuretics initially held and later restarted.   Today she returns for HF follow up. Last visit IV lasix given in the HF clinic. Weight at home has gone down from 302-->298 pounds. SOB with exertion. + Orthopnea. Denies PND. + Bendopnea. Continues to eat fast food weekly. Eats lots of ice. Taking all medications. Followed by [facility] for leg wounds.    ECHO 07/11/2015- 50-55%.  Grade I DD  RHC 09/22/13: RA = 4  RV = 30/5/7  PA = 25/10 (16)  PCW = 7  Fick cardiac output/index = 6.3/2.7  PVR = 1.5 WU   FA sat = 93%  PA sat = 61%, 66%   SH: Former smoker quit 15 year ago. Does drink alcohol. She is disabled. Lives at home with her husband and disabled son - Husband is truck Hospital doctor. Son has a brain injury after he attempted suicide  A few years ago. .    FH: Mom MI  ROS: All systems negative except as listed in HPI, PMH and Problem List.  Past Medical History:  Diagnosis Date  . Acute MI (HCC) 1999; 2007  . Anemia    hx  . Anginal pain (HCC)   . Anxiety   . Arthritis    "generalized" (03/15/2014)  . CAD (coronary artery disease)    MI in 2000 - MI  2007 - treated bare metal stent (no nuclear since then as 9/11)  . Carotid artery disease (HCC)   . CHF (congestive heart failure) (HCC)   . Chronic diastolic heart failure (HCC)    a) ECHO (08/2013) EF 55-60% and RV function nl b) RHC (08/2013) RA 4, RV 30/5/7, PA 25/10 (16), PCWP 7, Fick CO/CI 6.3/2.7, PVR 1.5 WU, PA 61 and 66%  . Daily headache    "~ every other day; since I fell in  June" (03/15/2014)  . Depression   . Dyslipidemia   . Exertional shortness of breath   . HTN (hypertension)   . Hypothyroidism   . Obesity   . Osteoarthritis   . Peripheral neuropathy   . PONV (postoperative nausea and vomiting)   . RBBB (right bundle branch block)    Old  . Renal insufficiency    DENIES  . Stroke [facility])    mini strokes  . Syncope    likely due to low blood sugar  . Tachycardia    Sinus tachycardia  . Type II diabetes mellitus (HCC)   . Urinary incontinence   . Venous insufficiency     Current Outpatient Medications  Medication Sig Dispense Refill  . albuterol (PROVENTIL HFA;VENTOLIN HFA) 108 (90 BASE) MCG/ACT inhaler Inhale 2 puffs into the lungs every 6 (six) hours as needed for wheezing or shortness of breath. Reported on 10/30/2015    . aspirin EC 81 MG tablet Take 81 mg by mouth daily.    . carvedilol (COREG) 12.5 MG tablet Take 12.5 mg by mouth 2 (two) times daily with a meal.    . clonazePAM (KLONOPIN) 1 MG tablet  TAKE 1 TABLET BY MOUTH THREE TIMES A DAY 90 tablet 0  . clopidogrel (PLAVIX) 75 MG tablet TAKE 1 TABLET (75 MG TOTAL) BY MOUTH DAILY WITH BREAKFAST. 90 tablet 2  . ferrous sulfate 325 (65 FE) MG tablet Take 325 mg by mouth daily with breakfast. Reported on 08/21/2015    . insulin NPH Human (HUMULIN N,NOVOLIN N) 100 UNIT/ML injection Inject 75 units in the morning and 6 units at bedtime    . insulin regular (NOVOLIN R,HUMULIN R) 250 units/2.11mL (100 units/mL) injection Inject 5-40 Units into the skin 3 (three) times daily before meals.    . isosorbide dinitrate (ISORDIL) 10 MG tablet Take 1 tablet (10 mg total) by mouth 2 (two) times daily. 60 tablet 6  . levothyroxine (SYNTHROID, LEVOTHROID) 50 MCG tablet Take 1 tablet (50 mcg total) by mouth daily before breakfast. 90 tablet 1  . losartan (COZAAR) 50 MG tablet TAKE 1 TABLET BY MOUTH EVERY DAY 90 tablet 1  . magnesium oxide (MAG-OX) 400 MG tablet Take 1 tablet (400 mg total) by mouth daily. 30  tablet 3  . metolazone (ZAROXOLYN) 2.5 MG tablet TAKE 1 TABLET BY MOUTH TWICE WEEKLY (TUESDAY AND WEDNESDAY) WEIGHT GAIN/SHORTNESS OF BREATH/SWELLING 10 tablet 1  . nitroGLYCERIN (NITROSTAT) 0.4 MG SL tablet PLACE 1 TABLET (0.4 MG TOTAL) UNDER THE TONGUE EVERY 5 (FIVE) MINUTES AS NEEDED FOR CHEST PAIN. 25 tablet 1  . PARoxetine (PAXIL-CR) 25 MG 24 hr tablet Take 25 mg by mouth daily. Take with 37.5 mg tablet daily (total 62.5 mg)    . PARoxetine (PAXIL-CR) 37.5 MG 24 hr tablet Take 37.5 mg by mouth daily. Take with 25 mg tablet daily (total 62.5 mg)    . potassium chloride (K-DUR) 10 MEQ tablet Take 1 tablet (10 mEq total) by mouth daily. Take with the water pill in the morning 30 tablet 6  . pregabalin (LYRICA) 150 MG capsule Take 1 capsule (150 mg total) by mouth 2 (two) times daily. 60 capsule 2  . rosuvastatin (CRESTOR) 20 MG tablet Take 1 tablet (20 mg total) by mouth daily. 90 tablet 3  . torsemide (DEMADEX) 20 MG tablet Take 5 tablets (100 mg total) by mouth 2 (two) times daily. 300 tablet 3   No current facility-administered medications for this encounter.     Vitals:   05/13/17 1408  BP: 134/72  Pulse: 91  SpO2: 96%  Weight: 297 lb 6.4 oz (134.9 kg)   Wt Readings from Last 3 Encounters:  05/13/17 297 lb 6.4 oz (134.9 kg)  04/29/17 297 lb 8 oz (134.9 kg)  02/27/17 290 lb (131.5 kg)      PHYSICAL EXAM: General:  Appears chronically ill.  No resp difficulty HEENT: normal Neck: supple. JVP 6-7. Carotids 2+ bilat; no bruits. No lymphadenopathy or thryomegaly appreciated. Cor: PMI nondisplaced. Regular rate & rhythm. No rubs, gallops or murmurs. Lungs: clear Abdomen: obese. soft, nontender, nondistended. No hepatosplenomegaly. No bruits or masses. Good bowel sounds. Extremities: no cyanosis, clubbing, rash, RLE unna boot LLE 1+ edema with partial thickness wounds.  Neuro: alert & orientedx3, cranial nerves grossly intact. moves all 4 extremities w/o difficulty. Affect  pleasant  ASSESSMENT & PLAN: 1. Acute on chronic Diastolic Heart Failure: NICM, EF 50-55% Grade II DD 06/2015 - NYHA IIIb. Volume status ok. Continue torsemide 100 mg twice a day + metolazone twice weekly  - Continue Coreg 12.5 mg BID - Continue losartan 50 mg daily.  - Set up repeat ECHO.  2.  Uncontrolled  DM:   - Follows closely with Dr Vernice Jefferson.   3. CAD, angina - Patient has a history of CAD with BMS in 2007  - continue statin and aspirin.  - no S/S ischemia.   4. Obesity: - Body mass index is 49.49 kg/m.  She is interested in weight loss surgery.  - Provide information for weight loss meeting.   6. HTN Ok today.   7. CKD Stage III-  -Followed by Dr Signe Colt.  -Check BMET today.   8. Day time fatigue - Refuses sleep study.   9. RLE/LLE wounds.  AHC to wrap with unna boots.    Follow up in 4 weeks.     Greater than 50% of the total minutes 25 visit spent in counseling/coordination of care regarding the above.     Arye Weyenberg NP-C   2:39 PM

## 2017-05-14 ENCOUNTER — Telehealth (HOSPITAL_COMMUNITY): Payer: Self-pay | Admitting: Cardiology

## 2017-05-14 DIAGNOSIS — E785 Hyperlipidemia, unspecified: Secondary | ICD-10-CM | POA: Diagnosis not present

## 2017-05-14 DIAGNOSIS — I252 Old myocardial infarction: Secondary | ICD-10-CM | POA: Diagnosis not present

## 2017-05-14 DIAGNOSIS — E039 Hypothyroidism, unspecified: Secondary | ICD-10-CM | POA: Diagnosis not present

## 2017-05-14 DIAGNOSIS — I130 Hypertensive heart and chronic kidney disease with heart failure and stage 1 through stage 4 chronic kidney disease, or unspecified chronic kidney disease: Secondary | ICD-10-CM | POA: Diagnosis not present

## 2017-05-14 DIAGNOSIS — F329 Major depressive disorder, single episode, unspecified: Secondary | ICD-10-CM | POA: Diagnosis not present

## 2017-05-14 DIAGNOSIS — Z87891 Personal history of nicotine dependence: Secondary | ICD-10-CM | POA: Diagnosis not present

## 2017-05-14 DIAGNOSIS — I25118 Atherosclerotic heart disease of native coronary artery with other forms of angina pectoris: Secondary | ICD-10-CM | POA: Diagnosis not present

## 2017-05-14 MED ORDER — METOLAZONE 2.5 MG PO TABS: 2.5000 mg | ORAL_TABLET | ORAL | 1 refills | Status: DC

## 2017-05-14 NOTE — Telephone Encounter (Signed)
-----   Message from Conrad Foyil, NP sent at 05/13/2017  4:56 PM EST ----- Creatinine elevated. Will cut back metolazone to once a day

## 2017-05-14 NOTE — Telephone Encounter (Signed)
Pt aware.

## 2017-05-20 DIAGNOSIS — N183 Chronic kidney disease, stage 3 (moderate): Secondary | ICD-10-CM | POA: Diagnosis not present

## 2017-05-20 DIAGNOSIS — R0602 Shortness of breath: Secondary | ICD-10-CM | POA: Diagnosis not present

## 2017-05-20 DIAGNOSIS — I5033 Acute on chronic diastolic (congestive) heart failure: Secondary | ICD-10-CM | POA: Diagnosis not present

## 2017-05-20 DIAGNOSIS — R269 Unspecified abnormalities of gait and mobility: Secondary | ICD-10-CM | POA: Diagnosis not present

## 2017-05-26 DIAGNOSIS — E785 Hyperlipidemia, unspecified: Secondary | ICD-10-CM | POA: Diagnosis not present

## 2017-05-26 DIAGNOSIS — F419 Anxiety disorder, unspecified: Secondary | ICD-10-CM | POA: Diagnosis not present

## 2017-05-26 DIAGNOSIS — I252 Old myocardial infarction: Secondary | ICD-10-CM | POA: Diagnosis not present

## 2017-05-26 DIAGNOSIS — Z7982 Long term (current) use of aspirin: Secondary | ICD-10-CM | POA: Diagnosis not present

## 2017-05-26 DIAGNOSIS — E669 Obesity, unspecified: Secondary | ICD-10-CM | POA: Diagnosis not present

## 2017-05-26 DIAGNOSIS — I25118 Atherosclerotic heart disease of native coronary artery with other forms of angina pectoris: Secondary | ICD-10-CM | POA: Diagnosis not present

## 2017-05-26 DIAGNOSIS — I5033 Acute on chronic diastolic (congestive) heart failure: Secondary | ICD-10-CM | POA: Diagnosis not present

## 2017-05-26 DIAGNOSIS — F329 Major depressive disorder, single episode, unspecified: Secondary | ICD-10-CM | POA: Diagnosis not present

## 2017-05-26 DIAGNOSIS — E039 Hypothyroidism, unspecified: Secondary | ICD-10-CM | POA: Diagnosis not present

## 2017-05-26 DIAGNOSIS — I130 Hypertensive heart and chronic kidney disease with heart failure and stage 1 through stage 4 chronic kidney disease, or unspecified chronic kidney disease: Secondary | ICD-10-CM | POA: Diagnosis not present

## 2017-05-26 DIAGNOSIS — Z7951 Long term (current) use of inhaled steroids: Secondary | ICD-10-CM | POA: Diagnosis not present

## 2017-05-26 DIAGNOSIS — E1122 Type 2 diabetes mellitus with diabetic chronic kidney disease: Secondary | ICD-10-CM | POA: Diagnosis not present

## 2017-05-26 DIAGNOSIS — L03115 Cellulitis of right lower limb: Secondary | ICD-10-CM | POA: Diagnosis not present

## 2017-05-26 DIAGNOSIS — Z87891 Personal history of nicotine dependence: Secondary | ICD-10-CM | POA: Diagnosis not present

## 2017-05-26 DIAGNOSIS — Z794 Long term (current) use of insulin: Secondary | ICD-10-CM | POA: Diagnosis not present

## 2017-05-28 ENCOUNTER — Other Ambulatory Visit: Payer: Self-pay | Admitting: Pharmacist

## 2017-05-28 ENCOUNTER — Ambulatory Visit: Payer: Self-pay | Admitting: Pharmacist

## 2017-05-28 DIAGNOSIS — I25118 Atherosclerotic heart disease of native coronary artery with other forms of angina pectoris: Secondary | ICD-10-CM | POA: Diagnosis not present

## 2017-05-28 DIAGNOSIS — F329 Major depressive disorder, single episode, unspecified: Secondary | ICD-10-CM | POA: Diagnosis not present

## 2017-05-28 DIAGNOSIS — I252 Old myocardial infarction: Secondary | ICD-10-CM | POA: Diagnosis not present

## 2017-05-28 DIAGNOSIS — E785 Hyperlipidemia, unspecified: Secondary | ICD-10-CM | POA: Diagnosis not present

## 2017-05-28 DIAGNOSIS — E039 Hypothyroidism, unspecified: Secondary | ICD-10-CM | POA: Diagnosis not present

## 2017-05-28 DIAGNOSIS — I130 Hypertensive heart and chronic kidney disease with heart failure and stage 1 through stage 4 chronic kidney disease, or unspecified chronic kidney disease: Secondary | ICD-10-CM | POA: Diagnosis not present

## 2017-05-28 DIAGNOSIS — Z87891 Personal history of nicotine dependence: Secondary | ICD-10-CM | POA: Diagnosis not present

## 2017-05-28 NOTE — Patient Outreach (Signed)
[facility] [facility]) Care Management  05/28/2017  Susan [name] 19-Apr-1951 [id number]   Patient was called for follow up. Unfortunately, she did not answer the phone. HIPAA compliant message was left on her voicemail. Today's call was actually the fourth unsuccessful call. Messages were left on both home phone and mobile.  Plan: Send patient an unsuccessful contact letter. Close patient's case if I do not hear back from her within 10 business days.  [name], PharmD, [place] Clinical Pharmacist [phone]

## 2017-05-29 DIAGNOSIS — E669 Obesity, unspecified: Secondary | ICD-10-CM | POA: Diagnosis not present

## 2017-05-29 DIAGNOSIS — Z7982 Long term (current) use of aspirin: Secondary | ICD-10-CM | POA: Diagnosis not present

## 2017-05-29 DIAGNOSIS — Z7951 Long term (current) use of inhaled steroids: Secondary | ICD-10-CM | POA: Diagnosis not present

## 2017-05-29 DIAGNOSIS — F419 Anxiety disorder, unspecified: Secondary | ICD-10-CM | POA: Diagnosis not present

## 2017-05-29 DIAGNOSIS — I25118 Atherosclerotic heart disease of native coronary artery with other forms of angina pectoris: Secondary | ICD-10-CM | POA: Diagnosis not present

## 2017-05-29 DIAGNOSIS — I130 Hypertensive heart and chronic kidney disease with heart failure and stage 1 through stage 4 chronic kidney disease, or unspecified chronic kidney disease: Secondary | ICD-10-CM | POA: Diagnosis not present

## 2017-05-29 DIAGNOSIS — Z87891 Personal history of nicotine dependence: Secondary | ICD-10-CM | POA: Diagnosis not present

## 2017-05-29 DIAGNOSIS — F329 Major depressive disorder, single episode, unspecified: Secondary | ICD-10-CM | POA: Diagnosis not present

## 2017-05-29 DIAGNOSIS — I252 Old myocardial infarction: Secondary | ICD-10-CM | POA: Diagnosis not present

## 2017-05-29 DIAGNOSIS — E039 Hypothyroidism, unspecified: Secondary | ICD-10-CM | POA: Diagnosis not present

## 2017-05-29 DIAGNOSIS — E1122 Type 2 diabetes mellitus with diabetic chronic kidney disease: Secondary | ICD-10-CM | POA: Diagnosis not present

## 2017-05-29 DIAGNOSIS — I5033 Acute on chronic diastolic (congestive) heart failure: Secondary | ICD-10-CM | POA: Diagnosis not present

## 2017-05-29 DIAGNOSIS — E785 Hyperlipidemia, unspecified: Secondary | ICD-10-CM | POA: Diagnosis not present

## 2017-05-29 DIAGNOSIS — Z794 Long term (current) use of insulin: Secondary | ICD-10-CM | POA: Diagnosis not present

## 2017-05-29 DIAGNOSIS — L03115 Cellulitis of right lower limb: Secondary | ICD-10-CM | POA: Diagnosis not present

## 2017-05-30 ENCOUNTER — Other Ambulatory Visit (HOSPITAL_COMMUNITY): Payer: Self-pay | Admitting: Cardiology

## 2017-06-02 ENCOUNTER — Ambulatory Visit (INDEPENDENT_AMBULATORY_CARE_PROVIDER_SITE_OTHER): Payer: PPO | Admitting: Family Medicine

## 2017-06-02 ENCOUNTER — Other Ambulatory Visit: Payer: Self-pay

## 2017-06-02 ENCOUNTER — Encounter: Payer: Self-pay | Admitting: Family Medicine

## 2017-06-02 VITALS — BP 128/74 | HR 84 | Temp 97.8°F | Resp 16 | Ht 65.0 in | Wt 295.0 lb

## 2017-06-02 DIAGNOSIS — N184 Chronic kidney disease, stage 4 (severe): Secondary | ICD-10-CM

## 2017-06-02 DIAGNOSIS — Z23 Encounter for immunization: Secondary | ICD-10-CM | POA: Diagnosis not present

## 2017-06-02 DIAGNOSIS — E1122 Type 2 diabetes mellitus with diabetic chronic kidney disease: Secondary | ICD-10-CM | POA: Diagnosis not present

## 2017-06-02 DIAGNOSIS — N393 Stress incontinence (female) (male): Secondary | ICD-10-CM | POA: Diagnosis not present

## 2017-06-02 DIAGNOSIS — F331 Major depressive disorder, recurrent, moderate: Secondary | ICD-10-CM | POA: Diagnosis not present

## 2017-06-02 DIAGNOSIS — E1165 Type 2 diabetes mellitus with hyperglycemia: Secondary | ICD-10-CM

## 2017-06-02 NOTE — Progress Notes (Signed)
Subjective:    Patient ID: Susan [name], female    DOB: Sep 16, 1950, 67 y.o.   MRN: [id number]  Patient presents for Follow-up (is not fasting) and Medication Management (is unable to afford Paxil CR)   Pt here to f/u chronic medical problems.  Has chronic diastolic heart failure, on metalazone in setting of fluid retention due to CKD down to 1 tablet daily, but has had unna boots for past 3 weeks. She has some venous stasis sores as well. Friday to be rechecked from [facility]. They also check her Blood pressure and weight   DM- last A1C 10.2%, takes 70mg  Humlin N in the morning and 6 units at Night, using sliding with Humalin R, the highest givne 35 units with a meal  CBG have been very high, sometime does not read on the meter, this morning was 185, but she didn't eat dinner last night  Hypothyroidism- endocrinology follows this  She did not go back to dietician appt, states "she knows what to eat"    Depression- currently on paxil 62.5mg  daily XR, also taking Klonopin, paxil is costing her $100 per month now     Due for prevnar- 13 Interested in new shingrix   Has urinary incontinence for years, wears depends, worse with the water pills  Opthalmology tomorrow-Dr. Baird Cancer Review Of Systems:  GEN- denies fatigue, fever, weight loss,weakness, recent illness HEENT- denies eye drainage, change in vision, nasal discharge, CVS- denies chest pain, palpitations RESP- denies SOB, cough, wheeze ABD- denies N/V, change in stools, abd pain GU- denies dysuria, hematuria, dribbling, incontinence MSK-+ joint pain, muscle aches, injury Neuro- denies headache, dizziness, syncope, seizure activity       Objective:    BP 128/74   Pulse 84   Temp 97.8 F (36.6 C) (Oral)   Resp 16   Ht 5\' 5"  (1.651 m)   Wt 295 lb (133.8 kg)   SpO2 97%   BMI 49.09 kg/m  GEN- NAD, alert and oriented x3,obese HEENT- PERRL, EOMI, non injected sclera, pink conjunctiva, MMM, oropharynx clear Neck- Supple,  no thyromegaly CVS- RRR, no murmur RESP-CTAB ABD-NABS,soft,NT,ND Skin- in tact beneath pannus EXT-  Unna boots bilat LE Pulses- Radial  2+        Assessment & Plan:      Problem List Items Addressed This Visit      Unprioritized   Morbid obesity ([place]) (Chronic)   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 mellitus (HCC)   Urinary incontinence - Primary    Continue depends, would not add any other medications, has been evaluated      Diabetes mellitus type II, uncontrolled ([place])    Adherence to diet is the major factor here I called and verified appt,Jan 15th 3:15pm  Discussed taking insulin as prescribed We have had multiple talks about her health, compliance She is killing herself, has multiple co-morbidites few of which are fairly controlled, as providers there is only so much we can do , if she is not willing to make some changes       MDD (major depressive disorder)    Will have her utilize the paxil she already brought Plan to transition to paxil 50mg  daily due to cost once she finishes current prescription       Other Visit Diagnoses    Need for shingles vaccine       Relevant Orders   Varicella-zoster vaccine IM (Shingrix) (Completed)   Need for vaccination against Streptococcus pneumoniae  Relevant Orders   Pneumococcal conjugate vaccine 13-valent IM (Completed)      Note: This dictation was prepared with Dragon dictation along with smaller phrase technology. Any transcriptional errors that result from this process are unintentional.

## 2017-06-02 NOTE — Assessment & Plan Note (Addendum)
Adherence to diet is the major factor here I called and verified appt,Jan 15th 3:15pm  Discussed taking insulin as prescribed We have had multiple talks about her health, compliance She is killing herself, has multiple co-morbidites few of which are fairly controlled, as providers there is only so much we can do , if she is not willing to make some changes

## 2017-06-02 NOTE — Patient Instructions (Addendum)
F/U 3  months PHYSICAL I will call about your paxil Release of records- Dr. Baird Cancer Opthalmology Dr. Leota Jacobsen 15th 3:15pm

## 2017-06-02 NOTE — Assessment & Plan Note (Signed)
Continue depends, would not add any other medications, has been evaluated

## 2017-06-02 NOTE — Assessment & Plan Note (Addendum)
Will have her utilize the paxil she already brought Plan to transition to paxil 50mg  daily due to cost once she finishes current prescription

## 2017-06-03 ENCOUNTER — Encounter: Payer: Self-pay | Admitting: Family Medicine

## 2017-06-03 DIAGNOSIS — R0602 Shortness of breath: Secondary | ICD-10-CM | POA: Diagnosis not present

## 2017-06-03 DIAGNOSIS — R269 Unspecified abnormalities of gait and mobility: Secondary | ICD-10-CM | POA: Diagnosis not present

## 2017-06-03 DIAGNOSIS — I5033 Acute on chronic diastolic (congestive) heart failure: Secondary | ICD-10-CM | POA: Diagnosis not present

## 2017-06-03 DIAGNOSIS — E113412 Type 2 diabetes mellitus with severe nonproliferative diabetic retinopathy with macular edema, left eye: Secondary | ICD-10-CM | POA: Diagnosis not present

## 2017-06-03 DIAGNOSIS — N183 Chronic kidney disease, stage 3 (moderate): Secondary | ICD-10-CM | POA: Diagnosis not present

## 2017-06-09 ENCOUNTER — Other Ambulatory Visit: Payer: Self-pay | Admitting: *Deleted

## 2017-06-09 DIAGNOSIS — E039 Hypothyroidism, unspecified: Secondary | ICD-10-CM | POA: Diagnosis not present

## 2017-06-09 DIAGNOSIS — I10 Essential (primary) hypertension: Secondary | ICD-10-CM | POA: Diagnosis not present

## 2017-06-09 DIAGNOSIS — I252 Old myocardial infarction: Secondary | ICD-10-CM | POA: Diagnosis not present

## 2017-06-09 DIAGNOSIS — I25118 Atherosclerotic heart disease of native coronary artery with other forms of angina pectoris: Secondary | ICD-10-CM | POA: Diagnosis not present

## 2017-06-09 DIAGNOSIS — E1165 Type 2 diabetes mellitus with hyperglycemia: Secondary | ICD-10-CM | POA: Diagnosis not present

## 2017-06-09 DIAGNOSIS — F329 Major depressive disorder, single episode, unspecified: Secondary | ICD-10-CM | POA: Diagnosis not present

## 2017-06-09 DIAGNOSIS — Z87891 Personal history of nicotine dependence: Secondary | ICD-10-CM | POA: Diagnosis not present

## 2017-06-09 DIAGNOSIS — E785 Hyperlipidemia, unspecified: Secondary | ICD-10-CM | POA: Diagnosis not present

## 2017-06-09 DIAGNOSIS — I130 Hypertensive heart and chronic kidney disease with heart failure and stage 1 through stage 4 chronic kidney disease, or unspecified chronic kidney disease: Secondary | ICD-10-CM | POA: Diagnosis not present

## 2017-06-09 DIAGNOSIS — R809 Proteinuria, unspecified: Secondary | ICD-10-CM | POA: Diagnosis not present

## 2017-06-09 DIAGNOSIS — G609 Hereditary and idiopathic neuropathy, unspecified: Secondary | ICD-10-CM | POA: Diagnosis not present

## 2017-06-09 NOTE — Patient Outreach (Signed)
[place] [facility]) Care Management  06/09/2017  Jenessa Gillingham 02-20-1951 [id number]   RN Health Coach is closing case at this time. The consumer has agreed to participate but does not respond to follow-up call attempts/ contacts.  Plan: RN sent closure letter to patient RN sent closure letter to patient RN notified CMA of closure and that pharmacy has case open  [place] Management [phone]

## 2017-06-11 ENCOUNTER — Encounter (HOSPITAL_COMMUNITY): Payer: PPO

## 2017-06-15 ENCOUNTER — Ambulatory Visit: Payer: Self-pay | Admitting: Pharmacist

## 2017-06-15 ENCOUNTER — Other Ambulatory Visit: Payer: Self-pay | Admitting: Pharmacist

## 2017-06-15 NOTE — Patient Outreach (Signed)
[facility]) Care Management  06/15/2017  Pama Roskos [birth year] [id number]   Patient was called for the fourth and final time. She was sent an unsuccessful contact letter and did not respond.  Plan: Case will be closed. [place] has already closed the patient.  Letters were sent to both the patient and her physician 06/09/2017  [name], PharmD, [place] Clinical Pharmacist [phone]

## 2017-06-22 ENCOUNTER — Encounter (HOSPITAL_COMMUNITY): Payer: Self-pay

## 2017-06-22 ENCOUNTER — Ambulatory Visit (HOSPITAL_COMMUNITY)
Admission: RE | Admit: 2017-06-22 | Discharge: 2017-06-22 | Disposition: A | Discharge status: Home / Self Care | Payer: PPO | Source: Ambulatory Visit | Attending: Cardiology | Admitting: Cardiology

## 2017-06-22 VITALS — BP 137/92 | HR 87 | Wt 297.0 lb

## 2017-06-22 DIAGNOSIS — Z8673 Personal history of transient ischemic attack (TIA), and cerebral infarction without residual deficits: Secondary | ICD-10-CM | POA: Diagnosis not present

## 2017-06-22 DIAGNOSIS — I252 Old myocardial infarction: Secondary | ICD-10-CM | POA: Diagnosis not present

## 2017-06-22 DIAGNOSIS — E669 Obesity, unspecified: Secondary | ICD-10-CM | POA: Diagnosis not present

## 2017-06-22 DIAGNOSIS — Z794 Long term (current) use of insulin: Secondary | ICD-10-CM | POA: Insufficient documentation

## 2017-06-22 DIAGNOSIS — E1122 Type 2 diabetes mellitus with diabetic chronic kidney disease: Secondary | ICD-10-CM

## 2017-06-22 DIAGNOSIS — Z7982 Long term (current) use of aspirin: Secondary | ICD-10-CM | POA: Insufficient documentation

## 2017-06-22 DIAGNOSIS — E1165 Type 2 diabetes mellitus with hyperglycemia: Secondary | ICD-10-CM | POA: Diagnosis not present

## 2017-06-22 DIAGNOSIS — Z79890 Hormone replacement therapy (postmenopausal): Secondary | ICD-10-CM | POA: Diagnosis not present

## 2017-06-22 DIAGNOSIS — I429 Cardiomyopathy, unspecified: Secondary | ICD-10-CM | POA: Insufficient documentation

## 2017-06-22 DIAGNOSIS — N182 Chronic kidney disease, stage 2 (mild): Secondary | ICD-10-CM

## 2017-06-22 DIAGNOSIS — N183 Chronic kidney disease, stage 3 unspecified: Secondary | ICD-10-CM

## 2017-06-22 DIAGNOSIS — I2510 Atherosclerotic heart disease of native coronary artery without angina pectoris: Secondary | ICD-10-CM | POA: Diagnosis not present

## 2017-06-22 DIAGNOSIS — Z87891 Personal history of nicotine dependence: Secondary | ICD-10-CM | POA: Insufficient documentation

## 2017-06-22 DIAGNOSIS — I130 Hypertensive heart and chronic kidney disease with heart failure and stage 1 through stage 4 chronic kidney disease, or unspecified chronic kidney disease: Secondary | ICD-10-CM | POA: Insufficient documentation

## 2017-06-22 DIAGNOSIS — Z9111 Patient's noncompliance with dietary regimen: Secondary | ICD-10-CM | POA: Insufficient documentation

## 2017-06-22 DIAGNOSIS — M1990 Unspecified osteoarthritis, unspecified site: Secondary | ICD-10-CM | POA: Diagnosis not present

## 2017-06-22 DIAGNOSIS — I4510 Unspecified right bundle-branch block: Secondary | ICD-10-CM | POA: Diagnosis not present

## 2017-06-22 DIAGNOSIS — I209 Angina pectoris, unspecified: Secondary | ICD-10-CM | POA: Insufficient documentation

## 2017-06-22 DIAGNOSIS — G629 Polyneuropathy, unspecified: Secondary | ICD-10-CM | POA: Insufficient documentation

## 2017-06-22 DIAGNOSIS — F329 Major depressive disorder, single episode, unspecified: Secondary | ICD-10-CM | POA: Diagnosis not present

## 2017-06-22 DIAGNOSIS — F419 Anxiety disorder, unspecified: Secondary | ICD-10-CM | POA: Diagnosis not present

## 2017-06-22 DIAGNOSIS — Z6841 Body Mass Index (BMI) 40.0 and over, adult: Secondary | ICD-10-CM | POA: Insufficient documentation

## 2017-06-22 DIAGNOSIS — E039 Hypothyroidism, unspecified: Secondary | ICD-10-CM | POA: Diagnosis not present

## 2017-06-22 DIAGNOSIS — E785 Hyperlipidemia, unspecified: Secondary | ICD-10-CM | POA: Diagnosis not present

## 2017-06-22 DIAGNOSIS — Z79899 Other long term (current) drug therapy: Secondary | ICD-10-CM | POA: Diagnosis not present

## 2017-06-22 DIAGNOSIS — I5032 Chronic diastolic (congestive) heart failure: Secondary | ICD-10-CM | POA: Diagnosis not present

## 2017-06-22 DIAGNOSIS — IMO0002 Reserved for concepts with insufficient information to code with codable children: Secondary | ICD-10-CM

## 2017-06-22 LAB — BASIC METABOLIC PANEL
Anion gap: 10 (ref 5–15)
BUN: 75 mg/dL — ABNORMAL HIGH (ref 6–20)
CO2: 24 mmol/L (ref 22–32)
Calcium: 8.8 mg/dL — ABNORMAL LOW (ref 8.9–10.3)
Chloride: 96 mmol/L — ABNORMAL LOW (ref 101–111)
Creatinine, Ser: 2.69 mg/dL — ABNORMAL HIGH (ref 0.44–1.00)
GFR calc Af Amer: 20 mL/min — ABNORMAL LOW (ref >60)
GFR calc non Af Amer: 17 mL/min — ABNORMAL LOW (ref >60)
Glucose, Bld: 424 mg/dL — ABNORMAL HIGH (ref 65–99)
Potassium: 4.9 mmol/L (ref 3.5–5.1)
Sodium: 130 mmol/L — ABNORMAL LOW (ref 135–145)

## 2017-06-22 NOTE — Patient Instructions (Addendum)
Labs today We will only contact you if something comes back abnormal or we need to make some changes. Otherwise no news is good news!   Your physician has requested that you have an echocardiogram. Echocardiography is a painless test that uses sound waves to create images of your heart. It provides your doctor with information about the size and shape of your heart and how well your heart's chambers and valves are working. This procedure takes approximately one hour. There are no restrictions for this procedure.    Your physician recommends that you schedule a follow-up appointment in: 4 weeks with [name],NP

## 2017-06-22 NOTE — Progress Notes (Signed)
fPatient ID: Susan [name], female   DOB: [birth year], 67 y.o.   MRN: [id number]   PCP: Dr Edilia Bo  Cardiologist: Dr Myrtis Ser  Endocrinologist: Dr Lurene Shadow  Primary HF Cardiologist: Dr. Gala Romney  Nephrology: Dr Signe Colt  [facility] Patient  HPI: Susan [name] is a 67 year old with a history of RBBB, DM, Hypothyroid, diastolic heart failure, CAD MI 2000/2007 BMS 2007, TIA, obesity.    Admitted June 1st through June 3rd, 2017 with altered mental status, hypothermia, and weakness. Diuretics initially held and later restarted.   Today she returns for HF follow up. Complaining of fatigue. SOB with exertion. + Bendopnea. Denies PND. Abdominal bloating. Taking metolazone once a week. No fever or chills. Taking all other medications.  Eating high salt diet --> doritos, cheese crackers, Bo jangles, and pizza.   ECHO 07/11/2015- 50-55%.  Grade I DD  RHC 09/22/13: RA = 4  RV = 30/5/7  PA = 25/10 (16)  PCW = 7  Fick cardiac output/index = 6.3/2.7  PVR = 1.5 WU   FA sat = 93%  PA sat = 61%, 66%   SH: Former smoker quit 15 year ago. Does drink alcohol. She is disabled. Lives at home with her husband and disabled son - Husband is truck Hospital doctor. Son has a brain injury after he attempted suicide  A few years ago. .    FH: Mom MI  ROS: All systems negative except as listed in HPI, PMH and Problem List.  Past Medical History:  Diagnosis Date  . Acute MI (HCC) 1999; 2007  . Anemia    hx  . Anginal pain (HCC)   . Anxiety   . Arthritis    "generalized" (03/15/2014)  . CAD (coronary artery disease)    MI in 2000 - MI  2007 - treated bare metal stent (no nuclear since then as 9/11)  . Carotid artery disease (HCC)   . CHF (congestive heart failure) (HCC)   . Chronic diastolic heart failure (HCC)    a) ECHO (08/2013) EF 55-60% and RV function nl b) RHC (08/2013) RA 4, RV 30/5/7, PA 25/10 (16), PCWP 7, Fick CO/CI 6.3/2.7, PVR 1.5 WU, PA 61 and 66%  . Daily headache    "~ every other day; since I fell in June"  (03/15/2014)  . Depression   . Dyslipidemia   . Exertional shortness of breath   . HTN (hypertension)   . Hypothyroidism   . Obesity   . Osteoarthritis   . Peripheral neuropathy   . PONV (postoperative nausea and vomiting)   . RBBB (right bundle branch block)    Old  . Renal insufficiency    DENIES  . Stroke [facility])    mini strokes  . Syncope    likely due to low blood sugar  . Tachycardia    Sinus tachycardia  . Type II diabetes mellitus (HCC)   . Urinary incontinence   . Venous insufficiency     Current Outpatient Medications  Medication Sig Dispense Refill  . albuterol (PROVENTIL HFA;VENTOLIN HFA) 108 (90 BASE) MCG/ACT inhaler Inhale 2 puffs into the lungs every 6 (six) hours as needed for wheezing or shortness of breath. Reported on 10/30/2015    . aspirin EC 81 MG tablet Take 81 mg by mouth daily.    . carvedilol (COREG) 12.5 MG tablet Take 12.5 mg by mouth 2 (two) times daily with a meal.    . clonazePAM (KLONOPIN) 1 MG tablet TAKE 1 TABLET BY MOUTH THREE TIMES A DAY  90 tablet 0  . clopidogrel (PLAVIX) 75 MG tablet TAKE 1 TABLET (75 MG TOTAL) BY MOUTH DAILY WITH BREAKFAST. 90 tablet 2  . ferrous sulfate 325 (65 FE) MG tablet Take 325 mg by mouth daily with breakfast. Reported on 08/21/2015    . insulin NPH Human (HUMULIN N,NOVOLIN N) 100 UNIT/ML injection Inject 75 units in the morning and 6 units at bedtime    . insulin regular (NOVOLIN R,HUMULIN R) 250 units/2.81mL (100 units/mL) injection Inject 5-40 Units into the skin 3 (three) times daily before meals.    . isosorbide dinitrate (ISORDIL) 10 MG tablet Take 1 tablet (10 mg total) by mouth 2 (two) times daily. 60 tablet 6  . levothyroxine (SYNTHROID, LEVOTHROID) 50 MCG tablet Take 1 tablet (50 mcg total) by mouth daily before breakfast. 90 tablet 1  . losartan (COZAAR) 50 MG tablet TAKE 1 TABLET BY MOUTH EVERY DAY 90 tablet 1  . magnesium oxide (MAG-OX) 400 MG tablet Take 1 tablet (400 mg total) by mouth daily. 30 tablet 3    . metolazone (ZAROXOLYN) 2.5 MG tablet Take 1 tablet (2.5 mg total) by mouth once a week. 10 tablet 1  . metolazone (ZAROXOLYN) 2.5 MG tablet Take 1 tablet (2.5 mg total) by mouth once a week. 4 tablet 1  . nitroGLYCERIN (NITROSTAT) 0.4 MG SL tablet PLACE 1 TABLET (0.4 MG TOTAL) UNDER THE TONGUE EVERY 5 (FIVE) MINUTES AS NEEDED FOR CHEST PAIN. 25 tablet 1  . PARoxetine (PAXIL-CR) 25 MG 24 hr tablet Take 25 mg by mouth daily. Take with 37.5 mg tablet daily (total 62.5 mg)    . PARoxetine (PAXIL-CR) 37.5 MG 24 hr tablet Take 37.5 mg by mouth daily. Take with 25 mg tablet daily (total 62.5 mg)    . potassium chloride (K-DUR) 10 MEQ tablet Take 1 tablet (10 mEq total) by mouth daily. Take with the water pill in the morning 30 tablet 6  . pregabalin (LYRICA) 150 MG capsule Take 1 capsule (150 mg total) by mouth 2 (two) times daily. 60 capsule 2  . rosuvastatin (CRESTOR) 20 MG tablet Take 1 tablet (20 mg total) by mouth daily. 90 tablet 3  . torsemide (DEMADEX) 20 MG tablet Take 5 tablets (100 mg total) by mouth 2 (two) times daily. 300 tablet 3   No current facility-administered medications for this encounter.     Vitals:   06/22/17 1425  BP: (!) 137/92  Pulse: 87  SpO2: 93%  Weight: 297 lb (134.7 kg)   Wt Readings from Last 3 Encounters:  06/22/17 297 lb (134.7 kg)  06/02/17 295 lb (133.8 kg)  05/13/17 297 lb 6.4 oz (134.9 kg)    PHYSICAL EXAM: General:  Appears chronically ill. No resp difficulty. Walked slowly in the clinic HEENT: normal Neck: supple. JVP 8-9. Carotids 2+ bilat; no bruits. No lymphadenopathy or thryomegaly appreciated. Cor: PMI nondisplaced. Regular rate & rhythm. No rubs, gallops or murmurs. Lungs: clear Abdomen: soft, nontender, nondistended. No hepatosplenomegaly. No bruits or masses. Good bowel sounds. Extremities: no cyanosis, clubbing, rash, R and LLE 1+ edema Neuro: alert & orientedx3, cranial nerves grossly intact. moves all 4 extremities w/o difficulty.  Affect pleasant   ASSESSMENT & PLAN: 1. Chronic Diastolic Heart Failure: NICM, EF 50-55% Grade II DD 06/2015 NYHA IIIb. Volume status mildly elevated in the setting of dietary noncompliance.  Continue torsemide 100 mg twice a day + metolazone weekly  - Continue Coreg 12.5 mg BID - Continue losartan 50 mg daily.  - Set  up ECHO.   2.  Uncontrolled DM:   - Follows closely with Dr Vernice Jefferson.   3. CAD, angina - Patient has a history of CAD with BMS in 2007  - continue statin and aspirin.  - no S/S ischemia.   4. Obesity: - Body mass index is 49.42 kg/m.  She is interested in weight loss surgery.  - Provide information for weight loss meeting.   6. HTN Ok today.   7. CKD Stage III-  -Followed by Dr Signe Colt.  -Check BMET today   8. Day time fatigue - Refuses sleep study.   9. RLE/LLE wounds.  Resolved.     Check BMET. Follow up in 4 weeks.         Naylani Bradner NP-C   2:40 PM

## 2017-06-23 ENCOUNTER — Other Ambulatory Visit: Payer: Self-pay | Admitting: Family Medicine

## 2017-06-23 DIAGNOSIS — F329 Major depressive disorder, single episode, unspecified: Secondary | ICD-10-CM

## 2017-06-23 DIAGNOSIS — F32A Depression, unspecified: Secondary | ICD-10-CM

## 2017-06-23 DIAGNOSIS — F419 Anxiety disorder, unspecified: Secondary | ICD-10-CM

## 2017-06-26 DIAGNOSIS — N179 Acute kidney failure, unspecified: Secondary | ICD-10-CM

## 2017-06-26 HISTORY — DX: Acute kidney failure, unspecified: N17.9

## 2017-07-07 ENCOUNTER — Other Ambulatory Visit: Payer: Self-pay | Admitting: Family Medicine

## 2017-07-07 MED ORDER — CARVEDILOL 12.5 MG PO TABS: 12.5000 mg | ORAL_TABLET | Freq: Two times a day (BID) | ORAL | 1 refills | Status: DC

## 2017-07-15 ENCOUNTER — Encounter: Payer: Self-pay | Admitting: Family Medicine

## 2017-07-15 ENCOUNTER — Inpatient Hospital Stay (HOSPITAL_COMMUNITY)
Admission: EM | Admit: 2017-07-15 | Discharge: 2017-07-19 | DRG: 638 | Disposition: A | Discharge status: Home Health | Payer: PPO | Attending: Internal Medicine | Admitting: Internal Medicine

## 2017-07-15 ENCOUNTER — Ambulatory Visit (INDEPENDENT_AMBULATORY_CARE_PROVIDER_SITE_OTHER): Payer: PPO | Admitting: Family Medicine

## 2017-07-15 ENCOUNTER — Encounter (HOSPITAL_COMMUNITY): Payer: Self-pay | Admitting: Emergency Medicine

## 2017-07-15 ENCOUNTER — Emergency Department (HOSPITAL_COMMUNITY): Payer: PPO

## 2017-07-15 ENCOUNTER — Inpatient Hospital Stay (HOSPITAL_COMMUNITY): Payer: PPO

## 2017-07-15 VITALS — BP 132/78 | HR 82 | Temp 98.1°F | Resp 16 | Ht 65.0 in | Wt 281.0 lb

## 2017-07-15 DIAGNOSIS — S4992XA Unspecified injury of left shoulder and upper arm, initial encounter: Secondary | ICD-10-CM | POA: Diagnosis not present

## 2017-07-15 DIAGNOSIS — B349 Viral infection, unspecified: Secondary | ICD-10-CM

## 2017-07-15 DIAGNOSIS — Z7982 Long term (current) use of aspirin: Secondary | ICD-10-CM

## 2017-07-15 DIAGNOSIS — R112 Nausea with vomiting, unspecified: Secondary | ICD-10-CM | POA: Diagnosis present

## 2017-07-15 DIAGNOSIS — Z66 Do not resuscitate: Secondary | ICD-10-CM | POA: Diagnosis present

## 2017-07-15 DIAGNOSIS — E860 Dehydration: Secondary | ICD-10-CM | POA: Diagnosis present

## 2017-07-15 DIAGNOSIS — E1122 Type 2 diabetes mellitus with diabetic chronic kidney disease: Secondary | ICD-10-CM | POA: Diagnosis present

## 2017-07-15 DIAGNOSIS — Z9841 Cataract extraction status, right eye: Secondary | ICD-10-CM

## 2017-07-15 DIAGNOSIS — M1712 Unilateral primary osteoarthritis, left knee: Secondary | ICD-10-CM | POA: Diagnosis present

## 2017-07-15 DIAGNOSIS — Z955 Presence of coronary angioplasty implant and graft: Secondary | ICD-10-CM

## 2017-07-15 DIAGNOSIS — Z9111 Patient's noncompliance with dietary regimen: Secondary | ICD-10-CM | POA: Diagnosis not present

## 2017-07-15 DIAGNOSIS — R739 Hyperglycemia, unspecified: Secondary | ICD-10-CM | POA: Diagnosis not present

## 2017-07-15 DIAGNOSIS — E1142 Type 2 diabetes mellitus with diabetic polyneuropathy: Secondary | ICD-10-CM | POA: Diagnosis present

## 2017-07-15 DIAGNOSIS — R52 Pain, unspecified: Secondary | ICD-10-CM

## 2017-07-15 DIAGNOSIS — K529 Noninfective gastroenteritis and colitis, unspecified: Secondary | ICD-10-CM

## 2017-07-15 DIAGNOSIS — D631 Anemia in chronic kidney disease: Secondary | ICD-10-CM | POA: Diagnosis present

## 2017-07-15 DIAGNOSIS — I5032 Chronic diastolic (congestive) heart failure: Secondary | ICD-10-CM | POA: Diagnosis present

## 2017-07-15 DIAGNOSIS — E669 Obesity, unspecified: Secondary | ICD-10-CM | POA: Diagnosis present

## 2017-07-15 DIAGNOSIS — N390 Urinary tract infection, site not specified: Secondary | ICD-10-CM | POA: Diagnosis present

## 2017-07-15 DIAGNOSIS — I10 Essential (primary) hypertension: Secondary | ICD-10-CM | POA: Diagnosis present

## 2017-07-15 DIAGNOSIS — F329 Major depressive disorder, single episode, unspecified: Secondary | ICD-10-CM | POA: Diagnosis present

## 2017-07-15 DIAGNOSIS — E1100 Type 2 diabetes mellitus with hyperosmolarity without nonketotic hyperglycemic-hyperosmolar coma (NKHHC): Principal | ICD-10-CM | POA: Diagnosis present

## 2017-07-15 DIAGNOSIS — E538 Deficiency of other specified B group vitamins: Secondary | ICD-10-CM | POA: Diagnosis present

## 2017-07-15 DIAGNOSIS — G8929 Other chronic pain: Secondary | ICD-10-CM | POA: Diagnosis present

## 2017-07-15 DIAGNOSIS — R6889 Other general symptoms and signs: Secondary | ICD-10-CM | POA: Diagnosis not present

## 2017-07-15 DIAGNOSIS — M25562 Pain in left knee: Secondary | ICD-10-CM | POA: Diagnosis not present

## 2017-07-15 DIAGNOSIS — E039 Hypothyroidism, unspecified: Secondary | ICD-10-CM | POA: Diagnosis present

## 2017-07-15 DIAGNOSIS — F419 Anxiety disorder, unspecified: Secondary | ICD-10-CM | POA: Diagnosis present

## 2017-07-15 DIAGNOSIS — E1101 Type 2 diabetes mellitus with hyperosmolarity with coma: Secondary | ICD-10-CM | POA: Diagnosis not present

## 2017-07-15 DIAGNOSIS — Z90710 Acquired absence of both cervix and uterus: Secondary | ICD-10-CM

## 2017-07-15 DIAGNOSIS — Z8673 Personal history of transient ischemic attack (TIA), and cerebral infarction without residual deficits: Secondary | ICD-10-CM | POA: Diagnosis not present

## 2017-07-15 DIAGNOSIS — I130 Hypertensive heart and chronic kidney disease with heart failure and stage 1 through stage 4 chronic kidney disease, or unspecified chronic kidney disease: Secondary | ICD-10-CM | POA: Diagnosis present

## 2017-07-15 DIAGNOSIS — R0602 Shortness of breath: Secondary | ICD-10-CM

## 2017-07-15 DIAGNOSIS — E1165 Type 2 diabetes mellitus with hyperglycemia: Secondary | ICD-10-CM

## 2017-07-15 DIAGNOSIS — Z6841 Body Mass Index (BMI) 40.0 and over, adult: Secondary | ICD-10-CM | POA: Diagnosis not present

## 2017-07-15 DIAGNOSIS — M25512 Pain in left shoulder: Secondary | ICD-10-CM

## 2017-07-15 DIAGNOSIS — R109 Unspecified abdominal pain: Secondary | ICD-10-CM | POA: Diagnosis not present

## 2017-07-15 DIAGNOSIS — R197 Diarrhea, unspecified: Secondary | ICD-10-CM | POA: Diagnosis not present

## 2017-07-15 DIAGNOSIS — Z79899 Other long term (current) drug therapy: Secondary | ICD-10-CM

## 2017-07-15 DIAGNOSIS — Z7902 Long term (current) use of antithrombotics/antiplatelets: Secondary | ICD-10-CM

## 2017-07-15 DIAGNOSIS — R262 Difficulty in walking, not elsewhere classified: Secondary | ICD-10-CM

## 2017-07-15 DIAGNOSIS — Z9842 Cataract extraction status, left eye: Secondary | ICD-10-CM

## 2017-07-15 DIAGNOSIS — E66813 Obesity, class 3: Secondary | ICD-10-CM | POA: Diagnosis present

## 2017-07-15 DIAGNOSIS — Z87891 Personal history of nicotine dependence: Secondary | ICD-10-CM

## 2017-07-15 DIAGNOSIS — I5022 Chronic systolic (congestive) heart failure: Secondary | ICD-10-CM | POA: Diagnosis present

## 2017-07-15 DIAGNOSIS — D509 Iron deficiency anemia, unspecified: Secondary | ICD-10-CM | POA: Diagnosis present

## 2017-07-15 DIAGNOSIS — I2510 Atherosclerotic heart disease of native coronary artery without angina pectoris: Secondary | ICD-10-CM | POA: Diagnosis present

## 2017-07-15 DIAGNOSIS — I252 Old myocardial infarction: Secondary | ICD-10-CM | POA: Diagnosis not present

## 2017-07-15 DIAGNOSIS — Z794 Long term (current) use of insulin: Secondary | ICD-10-CM

## 2017-07-15 DIAGNOSIS — I5023 Acute on chronic systolic (congestive) heart failure: Secondary | ICD-10-CM | POA: Diagnosis present

## 2017-07-15 DIAGNOSIS — N184 Chronic kidney disease, stage 4 (severe): Secondary | ICD-10-CM | POA: Diagnosis present

## 2017-07-15 DIAGNOSIS — E785 Hyperlipidemia, unspecified: Secondary | ICD-10-CM | POA: Diagnosis present

## 2017-07-15 LAB — URINALYSIS, ROUTINE W REFLEX MICROSCOPIC
Bilirubin Urine: NEGATIVE
Glucose, UA: >=500 mg/dL — AB
Ketones, ur: NEGATIVE mg/dL
Leukocytes, UA: NEGATIVE
Nitrite: NEGATIVE
Protein, ur: 30 mg/dL — AB
Specific Gravity, Urine: 1.012 (ref 1.005–1.030)
pH: 5 (ref 5.0–8.0)

## 2017-07-15 LAB — COMPREHENSIVE METABOLIC PANEL
ALT: 11 U/L — ABNORMAL LOW (ref 14–54)
AST: 17 U/L (ref 15–41)
Albumin: 3 g/dL — ABNORMAL LOW (ref 3.5–5.0)
Alkaline Phosphatase: 146 U/L — ABNORMAL HIGH (ref 38–126)
Anion gap: 14 (ref 5–15)
BUN: 57 mg/dL — ABNORMAL HIGH (ref 6–20)
CO2: 23 mmol/L (ref 22–32)
Calcium: 8.6 mg/dL — ABNORMAL LOW (ref 8.9–10.3)
Chloride: 83 mmol/L — ABNORMAL LOW (ref 101–111)
Creatinine, Ser: 2.98 mg/dL — ABNORMAL HIGH (ref 0.44–1.00)
GFR calc Af Amer: 18 mL/min — ABNORMAL LOW (ref >60)
GFR calc non Af Amer: 15 mL/min — ABNORMAL LOW (ref >60)
Glucose, Bld: 1079 mg/dL (ref 65–99)
Potassium: 5.1 mmol/L (ref 3.5–5.1)
Sodium: 120 mmol/L — ABNORMAL LOW (ref 135–145)
Total Bilirubin: 0.8 mg/dL (ref 0.3–1.2)
Total Protein: 7.1 g/dL (ref 6.5–8.1)

## 2017-07-15 LAB — I-STAT CHEM 8, ED
BUN: 59 mg/dL — ABNORMAL HIGH (ref 6–20)
Calcium, Ion: 0.93 mmol/L — ABNORMAL LOW (ref 1.15–1.40)
Chloride: 93 mmol/L — ABNORMAL LOW (ref 101–111)
Creatinine, Ser: 2.2 mg/dL — ABNORMAL HIGH (ref 0.44–1.00)
Glucose, Bld: >700 mg/dL (ref 65–99)
HCT: 27 % — ABNORMAL LOW (ref 36.0–46.0)
Hemoglobin: 9.2 g/dL — ABNORMAL LOW (ref 12.0–15.0)
Potassium: 4.8 mmol/L (ref 3.5–5.1)
Sodium: 127 mmol/L — ABNORMAL LOW (ref 135–145)
TCO2: 24 mmol/L (ref 22–32)

## 2017-07-15 LAB — CBC
HCT: 35.3 % — ABNORMAL LOW (ref 36.0–46.0)
Hemoglobin: 10.7 g/dL — ABNORMAL LOW (ref 12.0–15.0)
MCH: 26.3 pg (ref 26.0–34.0)
MCHC: 30.3 g/dL (ref 30.0–36.0)
MCV: 86.7 fL (ref 78.0–100.0)
Platelets: 170 10*3/uL (ref 150–400)
RBC: 4.07 MIL/uL (ref 3.87–5.11)
RDW: 16.5 % — ABNORMAL HIGH (ref 11.5–15.5)
WBC: 4 10*3/uL (ref 4.0–10.5)

## 2017-07-15 LAB — INFLUENZA A AND B AG, IMMUNOASSAY
INFLUENZA A ANTIGEN: NOT DETECTED
INFLUENZA B ANTIGEN: NOT DETECTED

## 2017-07-15 LAB — CBG MONITORING, ED
Glucose-Capillary: >600 mg/dL (ref 65–99)
Glucose-Capillary: >600 mg/dL (ref 65–99)
Glucose-Capillary: 425 mg/dL — ABNORMAL HIGH (ref 65–99)

## 2017-07-15 LAB — GLUCOSE 16585

## 2017-07-15 LAB — I-STAT TROPONIN, ED: Troponin i, poc: 0 ng/mL (ref 0.00–0.08)

## 2017-07-15 LAB — I-STAT CG4 LACTIC ACID, ED: Lactic Acid, Venous: 1.54 mmol/L (ref 0.5–1.9)

## 2017-07-15 MED ORDER — FERROUS SULFATE 325 (65 FE) MG PO TABS
325.0000 mg | ORAL_TABLET | Freq: Every day | ORAL | Status: DC
  Filled 2017-07-15: qty 1

## 2017-07-15 MED ORDER — HEPARIN SODIUM (PORCINE) 5000 UNIT/ML IJ SOLN
5000.0000 [IU] | Freq: Three times a day (TID) | INTRAMUSCULAR | Status: DC
  Administered 2017-07-16 – 2017-07-19 (×10): 5000 [IU] via SUBCUTANEOUS
  Filled 2017-07-15 (×10): qty 1

## 2017-07-15 MED ORDER — ISOSORBIDE DINITRATE 10 MG PO TABS
10.0000 mg | ORAL_TABLET | Freq: Two times a day (BID) | ORAL | Status: DC
  Administered 2017-07-16 – 2017-07-19 (×8): 10 mg via ORAL
  Filled 2017-07-15 (×9): qty 1

## 2017-07-15 MED ORDER — PAROXETINE HCL ER 37.5 MG PO TB24: 37.5000 mg | ORAL_TABLET | Freq: Every day | ORAL | Status: DC

## 2017-07-15 MED ORDER — SODIUM CHLORIDE 0.9 % IV SOLN
INTRAVENOUS | Status: DC
  Administered 2017-07-15: 7.3 [IU]/h via INTRAVENOUS

## 2017-07-15 MED ORDER — CLOPIDOGREL BISULFATE 75 MG PO TABS
75.0000 mg | ORAL_TABLET | Freq: Every day | ORAL | Status: DC
  Administered 2017-07-16 – 2017-07-19 (×4): 75 mg via ORAL
  Filled 2017-07-15 (×5): qty 1

## 2017-07-15 MED ORDER — CARVEDILOL 12.5 MG PO TABS
12.5000 mg | ORAL_TABLET | Freq: Two times a day (BID) | ORAL | Status: DC
  Administered 2017-07-16 – 2017-07-19 (×7): 12.5 mg via ORAL
  Filled 2017-07-15 (×7): qty 1

## 2017-07-15 MED ORDER — LEVOTHYROXINE SODIUM 50 MCG PO TABS
50.0000 ug | ORAL_TABLET | Freq: Every day | ORAL | Status: DC
  Filled 2017-07-15: qty 1

## 2017-07-15 MED ORDER — DEXTROSE-NACL 5-0.45 % IV SOLN
INTRAVENOUS | Status: DC
  Administered 2017-07-16: 02:00:00 via INTRAVENOUS

## 2017-07-15 MED ORDER — PREGABALIN 75 MG PO CAPS
150.0000 mg | ORAL_CAPSULE | Freq: Two times a day (BID) | ORAL | Status: DC
  Administered 2017-07-16 – 2017-07-19 (×7): 150 mg via ORAL
  Filled 2017-07-15 (×5): qty 2
  Filled 2017-07-15: qty 1
  Filled 2017-07-15: qty 2

## 2017-07-15 MED ORDER — SODIUM CHLORIDE 0.9 % IV BOLUS (SEPSIS)
2000.0000 mL | Freq: Once | INTRAVENOUS | Status: AC
  Administered 2017-07-15: 2000 mL via INTRAVENOUS

## 2017-07-15 MED ORDER — SODIUM CHLORIDE 0.9 % IV SOLN
INTRAVENOUS | Status: DC
  Administered 2017-07-15: 5.4 [IU]/h via INTRAVENOUS
  Filled 2017-07-15: qty 1

## 2017-07-15 MED ORDER — CLONAZEPAM 1 MG PO TABS
1.0000 mg | ORAL_TABLET | Freq: Three times a day (TID) | ORAL | Status: DC
  Administered 2017-07-16 – 2017-07-19 (×11): 1 mg via ORAL
  Filled 2017-07-15: qty 1
  Filled 2017-07-15: qty 2
  Filled 2017-07-15 (×5): qty 1
  Filled 2017-07-15: qty 2
  Filled 2017-07-15 (×2): qty 1
  Filled 2017-07-15: qty 2
  Filled 2017-07-15: qty 1

## 2017-07-15 MED ORDER — PAROXETINE HCL ER 25 MG PO TB24: 25.0000 mg | ORAL_TABLET | Freq: Every day | ORAL | Status: DC

## 2017-07-15 MED ORDER — SODIUM CHLORIDE 0.9 % IV SOLN
INTRAVENOUS | Status: DC
  Administered 2017-07-16 (×3): via INTRAVENOUS

## 2017-07-15 MED ORDER — ROSUVASTATIN CALCIUM 20 MG PO TABS
20.0000 mg | ORAL_TABLET | Freq: Every day | ORAL | Status: DC
  Filled 2017-07-15: qty 1

## 2017-07-15 MED ORDER — SODIUM CHLORIDE 0.9 % IV BOLUS (SEPSIS)
500.0000 mL | Freq: Once | INTRAVENOUS | Status: AC
  Administered 2017-07-15: 500 mL via INTRAVENOUS

## 2017-07-15 MED ORDER — DEXTROSE-NACL 5-0.45 % IV SOLN: INTRAVENOUS | Status: DC

## 2017-07-15 MED ORDER — ASPIRIN EC 81 MG PO TBEC
81.0000 mg | DELAYED_RELEASE_TABLET | Freq: Every day | ORAL | Status: DC
  Administered 2017-07-16 – 2017-07-19 (×4): 81 mg via ORAL
  Filled 2017-07-15 (×4): qty 1

## 2017-07-15 MED ORDER — PAROXETINE HCL ER 37.5 MG PO TB24
62.5000 mg | ORAL_TABLET | Freq: Every day | ORAL | Status: DC
  Filled 2017-07-15: qty 1

## 2017-07-15 MED ORDER — NITROGLYCERIN 0.4 MG SL SUBL: 0.4000 mg | SUBLINGUAL_TABLET | SUBLINGUAL | Status: DC | PRN

## 2017-07-15 NOTE — H&P (Addendum)
History and Physical    Susan [name] EUM:[id number] DOB: [birth year] DOA: 07/15/2017  PCP: Susan Rossetti, MD  Patient coming from: Home.  Chief Complaint: Elevated blood sugar.  HPI: Susan [name] is a 67 y.o. female with history of chronic diastolic CHF, CAD status post bare-metal stent placement, hypothyroidism, chronic kidney disease, chronic anemia probably from renal disease was referred to the ER by patient's primary care physician after patient's blood sugar was found to be unrecordable.  Patient was placed on antibiotics for oral infection 2 weeks ago which patient completed last week.  Patient took 1 whole week of antibiotic.  Does not recall the name.  After the antibiotic course was overall patient was having persistent diarrhea with nausea vomiting.  Diarrhea was clear with no blood.  Patient was unable to eat anything because of fear of diarrhea.  Denies any abdominal pain chest pain or shortness of breath.  Patient states he has been taking a long-acting insulin but cut short on her sliding scale because she was not eating anything.  Due to persistent weakness nausea vomiting and diarrhea patient went to her primary care physician who checked her blood sugar and was found to be markedly elevated and was referred to the ER.  ED Course: In the ER patient blood sugar was found to be in thousand 79 with anion gap of 14.  The patient was started on fluid bolus and IV insulin infusion for hyperosmolar status.  Sodium was 120 likely from hyperglycemia and dehydration.  Patient will be admitted for hyperosmolar status with nausea vomiting and diarrhea as a precipitating cause.  X-ray of the left shoulder was done since patient was complaining of pain which was unremarkable.  Patient denies any pain to me.  Review of Systems: As per HPI, rest all negative.   Past Medical History:  Diagnosis Date  . Acute MI ([place]) 1999; 2007  . Anemia    hx  . Anginal pain ([place])   . Anxiety   . Arthritis     "generalized" (03/15/2014)  . CAD (coronary artery disease)    MI in 2000 - MI  2007 - treated bare metal stent (no nuclear since then as 9/11)  . Carotid artery disease ([place])   . CHF (congestive heart failure) ([place])   . Chronic diastolic heart failure (HCC)    a) ECHO (08/2013) EF 55-60% and RV function nl b) RHC (08/2013) RA 4, RV 30/5/7, PA 25/10 (16), PCWP 7, Fick CO/CI 6.3/2.7, PVR 1.5 WU, PA 61 and 66%  . Daily headache    "~ every other day; since I fell in June" (03/15/2014)  . Depression   . Dyslipidemia   . Exertional shortness of breath   . HTN (hypertension)   . Hypothyroidism   . Obesity   . Osteoarthritis   . Peripheral neuropathy   . PONV (postoperative nausea and vomiting)   . RBBB (right bundle branch block)    Old  . Renal insufficiency    DENIES  . Stroke [facility])    mini strokes  . Syncope    likely due to low blood sugar  . Tachycardia    Sinus tachycardia  . Type II diabetes mellitus ([place])   . Urinary incontinence   . Venous insufficiency     Past Surgical History:  Procedure Laterality Date  . ABDOMINAL HYSTERECTOMY  1980's  . CATARACT EXTRACTION, BILATERAL Bilateral ?2013  . CORONARY ANGIOPLASTY WITH STENT PLACEMENT  1999; 2007   "1 + 1"  .  KNEE ARTHROSCOPY Left 10/25/2006  . RIGHT HEART CATHETERIZATION N/A 09/22/2013   Procedure: RIGHT HEART CATH;  Surgeon: Jolaine Artist, MD;  Location: [facility] CATH LAB;  Service: Cardiovascular;  Laterality: N/A;  . SHOULDER ARTHROSCOPY W/ ROTATOR CUFF REPAIR Right 03/14/2014  . SHOULDER ARTHROSCOPY WITH OPEN ROTATOR CUFF REPAIR Right 03/14/2014   Procedure: RIGHT SHOULDER ARTHROSCOPY WITH BICEPS RELEASE, OPEN SUBSCAPULA REPAIR, OPEN SUPRASPINATUS REPAIR.;  Surgeon: Meredith Pel, MD;  Location: [place];  Service: Orthopedics;  Laterality: Right;  . TUBAL LIGATION  1970's     reports that she quit smoking about 19 years ago. Her smoking use included cigarettes. She has a 96.00 pack-year smoking history. she  has never used smokeless tobacco. She reports that she drinks alcohol. She reports that she does not use drugs.  Allergies  Allergen Reactions  . Codeine Nausea And Vomiting    Family History  Problem Relation Age of Onset  . Heart attack Mother   . Coronary artery disease Unknown     Prior to Admission medications   Medication Sig Start Date End Date Taking? Authorizing Provider  albuterol (PROVENTIL HFA;VENTOLIN HFA) 108 (90 BASE) MCG/ACT inhaler Inhale 2 puffs into the lungs every 6 (six) hours as needed for wheezing or shortness of breath. Reported on 10/30/2015   Yes [provider]  aspirin EC 81 MG tablet Take 81 mg by mouth daily.   Yes [provider]  carvedilol (COREG) 12.5 MG tablet Take 1 tablet (12.5 mg total) by mouth 2 (two) times daily with a meal. 07/07/17  Yes Dixon, Mary B, PA-C  clonazePAM (KLONOPIN) 1 MG tablet TAKE 1 TABLET BY MOUTH THREE TIMES A DAY Patient taking differently: TAKE 1mg   TABLET BY MOUTH THREE TIMES A DAY 05/13/17  Yes [place], [name], MD  clopidogrel (PLAVIX) 75 MG tablet TAKE 1 TABLET (75 MG TOTAL) BY MOUTH DAILY WITH BREAKFAST. 03/04/17  Yes Larey Dresser, MD  ferrous sulfate 325 (65 FE) MG tablet Take 325 mg by mouth daily with breakfast. Reported on 08/21/2015   Yes [provider]  insulin NPH Human (HUMULIN N,NOVOLIN N) 100 UNIT/ML injection Inject 75 units in the morning and 6 units at bedtime   Yes [provider]  insulin regular (NOVOLIN R,HUMULIN R) 250 units/2.50mL (100 units/mL) injection Inject 5-40 Units into the skin 3 (three) times daily before meals. Sliding scale   Yes [provider]  isosorbide dinitrate (ISORDIL) 10 MG tablet Take 1 tablet (10 mg total) by mouth 2 (two) times daily. 01/16/17  Yes Larey Dresser, MD  levothyroxine (SYNTHROID, LEVOTHROID) 50 MCG tablet Take 1 tablet (50 mcg total) by mouth daily before breakfast. 11/07/16  Yes Dena Billet B, PA-C  losartan (COZAAR) 50 MG  tablet TAKE 1 TABLET BY MOUTH EVERY DAY Patient taking differently: TAKE 50 mg  TABLET BY MOUTH EVERY DAY 03/09/17  Yes [place], [name], MD  Magnesium Oxide 400 (240 Mg) MG TABS TAKE 1 TABLET BY MOUTH EVERY DAY Patient taking differently: TAKE 240 mg TABLET BY MOUTH EVERY DAY 06/23/17  Yes [name], MD  metolazone (ZAROXOLYN) 2.5 MG tablet Take 1 tablet (2.5 mg total) by mouth once a week. 05/14/17  Yes Bensimhon, Shaune Pascal, MD  nitroGLYCERIN (NITROSTAT) 0.4 MG SL tablet PLACE 1 TABLET (0.4 MG TOTAL) UNDER THE TONGUE EVERY 5 (FIVE) MINUTES AS NEEDED FOR CHEST PAIN. 08/28/16  Yes [name], PA-C  PARoxetine (PAXIL-CR) 25 MG 24 hr tablet Take 25 mg  by mouth daily. Take with 37.5 mg tablet daily (total 62.5 mg)   Yes [provider]  PARoxetine (PAXIL-CR) 37.5 MG 24 hr tablet Take 37.5 mg by mouth daily. Take with 25 mg tablet daily (total 62.5 mg)   Yes [provider]  potassium chloride (K-DUR) 10 MEQ tablet Take 1 tablet (10 mEq total) by mouth daily. Take with the water pill in the morning 02/27/17  Yes [place], [name], MD  pregabalin (LYRICA) 150 MG capsule Take 1 capsule (150 mg total) by mouth 2 (two) times daily. 11/12/16  Yes [place], [name], MD  rosuvastatin (CRESTOR) 20 MG tablet Take 1 tablet (20 mg total) by mouth daily. 11/20/16  Yes [place], [name], MD  torsemide (DEMADEX) 20 MG tablet Take 5 tablets (100 mg total) by mouth 2 (two) times daily. 01/13/17  Yes Clegg, Amy D, NP  metolazone (ZAROXOLYN) 2.5 MG tablet Take 1 tablet (2.5 mg total) by mouth once a week. Patient not taking: Reported on 07/15/2017 06/02/17   Bensimhon, Shaune Pascal, MD  PARoxetine (PAXIL-CR) 37.5 MG 24 hr tablet TAKE 1 TABLET BY MOUTH EVERY DAY Patient not taking: Reported on 07/15/2017 06/23/17   Susan Rossetti, MD    Physical Exam: Vitals:   07/15/17 1554  Pulse: 80  Resp: 18  Temp: 97.8 F (36.6 C)  TempSrc: Oral  SpO2: 93%      Constitutional: Moderately built and  nourished. Vitals:   07/15/17 1554  Pulse: 80  Resp: 18  Temp: 97.8 F (36.6 C)  TempSrc: Oral  SpO2: 93%   Eyes: Anicteric no pallor. ENMT: No discharge from the ears eyes nose or mouth. Neck: No mass palpated no JVD appreciated. Respiratory: No rhonchi or crepitations. Cardiovascular: S1-S2 heard no murmurs appreciated. Abdomen: Soft nontender bowel sounds present.  Musculoskeletal: No edema.  No joint effusion. Skin: No rash.  Skin appears warm. Neurologic: Alert awake oriented to time place and person.  Moves all extremities. Psychiatric: Appears normal.  Normal affect.   Labs on Admission: I have personally reviewed following labs and imaging studies  CBC: Recent Labs  Lab 07/15/17 1557 07/15/17 2050  WBC 4.0  --   HGB 10.7* 9.2*  HCT 35.3* 27.0*  MCV 86.7  --   PLT 170  --    Basic Metabolic Panel: Recent Labs  Lab 07/15/17 1557 07/15/17 2050  NA 120* 127*  K 5.1 4.8  CL 83* 93*  CO2 23  --   GLUCOSE 1,079* >700*  BUN 57* 59*  CREATININE 2.98* 2.20*  CALCIUM 8.6*  --    GFR: Estimated Creatinine Clearance: 32.9 mL/min (A) (by C-G formula based on SCr of 2.2 mg/dL (H)). Liver Function Tests: Recent Labs  Lab 07/15/17 1557  AST 17  ALT 11*  ALKPHOS 146*  BILITOT 0.8  PROT 7.1  ALBUMIN 3.0*   No results for input(s): LIPASE, AMYLASE in the last 168 hours. No results for input(s): AMMONIA in the last 168 hours. Coagulation Profile: No results for input(s): INR, PROTIME in the last 168 hours. Cardiac Enzymes: No results for input(s): CKTOTAL, CKMB, CKMBINDEX, TROPONINI in the last 168 hours. BNP (last 3 results) No results for input(s): PROBNP in the last 8760 hours. HbA1C: No results for input(s): HGBA1C in the last 72 hours. CBG: Recent Labs  Lab 07/15/17 2056 07/15/17 2208  GLUCAP >600* >600*   Lipid Profile: No results for input(s): CHOL, HDL, LDLCALC, TRIG, CHOLHDL, LDLDIRECT in the last 72 hours. Thyroid Function Tests:  No  results for input(s): TSH, T4TOTAL, FREET4, T3FREE, THYROIDAB in the last 72 hours. Anemia Panel: No results for input(s): VITAMINB12, FOLATE, FERRITIN, TIBC, IRON, RETICCTPCT in the last 72 hours. Urine analysis:    Component Value Date/Time   COLORURINE YELLOW 07/15/2017 2009   APPEARANCEUR CLEAR 07/15/2017 2009   LABSPEC 1.012 07/15/2017 2009   PHURINE 5.0 07/15/2017 2009   GLUCOSEU >=500 (A) 07/15/2017 2009   HGBUR MODERATE (A) 07/15/2017 2009   BILIRUBINUR NEGATIVE 07/15/2017 2009   [place] NEGATIVE 07/15/2017 2009   PROTEINUR 30 (A) 07/15/2017 2009   UROBILINOGEN 0.2 04/01/2014 1659   NITRITE NEGATIVE 07/15/2017 2009   LEUKOCYTESUR NEGATIVE 07/15/2017 2009   Sepsis Labs: @LABRCNTIP (procalcitonin:4,lacticidven:4) )No results found for this or any previous visit (from the past 240 hour(s)).   Radiological Exams on Admission: Dg Shoulder Left  Result Date: 07/15/2017 CLINICAL DATA:  [place] on floor 5 days ago. Left shoulder pain. Initial encounter. EXAM: LEFT SHOULDER - 2+ VIEW COMPARISON:  None. FINDINGS: There is no evidence of fracture or dislocation. Mild acromioclavicular joint degenerative changes. No other bone lesions identified. Soft tissues are unremarkable. IMPRESSION: No acute findings. Mild acromioclavicular DJD. Electronically Signed   By: Earle Gell M.D.   On: 07/15/2017 19:49    Assessment/Plan Principal Problem:   Hyperosmolar non-ketotic state in patient with type 2 diabetes mellitus ([place]) Active Problems:   Essential hypertension   CAD (coronary artery disease)   Chronic diastolic CHF (congestive heart failure) ([place])   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 mellitus (HCC)   Nausea vomiting and diarrhea    1. Hyperosmolar nonketotic uncontrolled diabetes mellitus type 2 -likely precipitated by patient's nausea vomiting and diarrhea and patient taking less than the usual dose of insulin.  Patient is placed on IV fluids and IV insulin infusion.  Closely follow  metabolic panel CBGs.  Once blood sugar decreases less than 250 and patient is able to eat well will discontinue insulin infusion.  Cautioned the patient is on large doses of diuretics for CHF so can easily get volume overloaded. 2. Nausea vomiting and diarrhea -abdomen appears benign.  Since patient has been recently on antibiotics will check stool for C. difficile and also check GI pathogen panel.  Patient denies any sick contacts or recent travel. 3. History of CAD status post stenting -denies any chest pain I will had some left shoulder pain we will cycle cardiac markers check EKG.  Chest x-ray is pending.  Patient on aspirin Plavix statins and Coreg. 4. History of diastolic CHF on large doses of torsemide which should be restarted as soon as possible.  Presently on hydration.  I am getting a chest x-ray to check the volume status.  We will closely monitor in stepdown. 5. Chronic kidney disease stage III creatinine appears to be at baseline. 6. Chronic anemia likely from chronic disease -hemoglobin at baseline. 7. Hypothyroidism on Synthroid.   DVT prophylaxis: Heparin. Code Status: DNR. Family Communication: Family at the bedside. Disposition Plan: Home. Consults called: None. Admission status: Inpatient.   Rise Patience MD [facility] Pager [phone].  If 7PM-7AM, please contact night-coverage [email or web address] Password [facility]  07/15/2017, 10:44 PM

## 2017-07-15 NOTE — ED Notes (Signed)
Patient transported to X-ray 

## 2017-07-15 NOTE — Assessment & Plan Note (Signed)
Diabetes is uncontrolled however now currently in an ill state likely a viral illness but with significant dehydration.  Her blood sugar is likely over 500 we cannot get it to read on her meters here in the office.  She is not eating tolerating p.o. at this time and her blood sugars that high.  She likely has acute on chronic kidney failure as well as this time.  Her influenza was negative here in the office.  She is not safe to go back home in her current state.  I discussed with her husband states that he tried taking her to the hospital multiple times that she did not want to go.  She declines going by EMS but is willing to go to the hospital today.  She is going any significant workup glucose controlled fluid resuscitation.  She is being transported to the hospital by her husband we had to place her in a wheelchair to get her into the car.  In her fall also related to her dehydration and some dizziness.  She has some decreased range of motion in that upper extremity this can be x-rayed at the hospital.

## 2017-07-15 NOTE — ED Triage Notes (Signed)
Patient sent here from PCP for hyperglycemia and dehydration after feeling run down the past week with cold/flu like symptoms.    Dr. Ashok Cordia at bedside

## 2017-07-15 NOTE — ED Notes (Signed)
Hospitalist at bedside 

## 2017-07-15 NOTE — ED Notes (Signed)
Admitting MD at bedside.

## 2017-07-15 NOTE — Patient Instructions (Addendum)
Flu Test negative  Go to ER, she needs IV fluids, Insulin, labs, xray Blood sugar also extremely high DID NOT READ on Meter

## 2017-07-15 NOTE — Progress Notes (Signed)
Subjective:    Patient ID: Susan [name], female    DOB: 1950/12/24, 67 y.o.   MRN: [id number]  Patient presents for Illness (x1 week- fever, HA, fatigue, stomach pain, weakness, cough, loss of appetitie, N/V/ D) and L Shoulder Pain (fell on Friday and now has pain to L shoulder)  Has been in the bed a week, with diarrhea, nausea, vomiting this morning, dry cough. Subjective fever. Eating cups of applesauce / eating fruit, drinking gingerale and crackers, weight down 14lbs since Jan 5th  Tried to eat oatmeal and took meds this morning and threw it up Diarrhea typically3-4 times a day  No sick contacts  Has not been giving her short actiing Novolin R, but injecting 75units of the [employer] against the dresser Friday and window facing, does not remember if she tripped, hit left shoulder.   Husband brought to appointment    Review Of Systems:  GEN- denies fatigue, fever, weight loss,weakness, recent illness HEENT- denies eye drainage, change in vision, nasal discharge, CVS- denies chest pain, palpitations RESP- denies SOB, cough, wheeze ABD- denies N/V, change in stools, abd pain GU- denies dysuria, hematuria, dribbling, incontinence MSK- + joint pain, muscle aches, injury Neuro- denies headache, dizziness, syncope, seizure activity       Objective:    BP 132/78   Pulse 82   Temp 98.1 F (36.7 C) (Oral)   Resp 16   Ht 5\' 5"  (1.651 m)   Wt 281 lb (127.5 kg)   SpO2 95%   BMI 46.76 kg/m  GEN- NAD, alert and oriented x3, sick appearing ,weight loss 14lbs,very shaky when upright  HEENT- PERRL, EOMI, non injected sclera, pink conjunctiva, dry MM, oropharynx clear Neck- Supple, no LAD CVS- RRR, no murmur RESP-CTAB ABD-NABS,soft,NT,ND EXT- pedal edema, chronic venous stasis changes MSK- decreased ROM LUE, TTP over AC region/deltoid region, bruising right wrist  Pulses- Radial  2+, cap reifll about 4 sec         Assessment & Plan:     Call report made to ER Problem List  Items Addressed This Visit      Unprioritized   Diabetes mellitus type II, uncontrolled ([place]) - Primary    Diabetes is uncontrolled however now currently in an ill state likely a viral illness but with significant dehydration.  Her blood sugar is likely over 500 we cannot get it to read on her meters here in the office.  She is not eating tolerating p.o. at this time and her blood sugars that high.  She likely has acute on chronic kidney failure as well as this time.  Her influenza was negative here in the office.  She is not safe to go back home in her current state.  I discussed with her husband states that he tried taking her to the hospital multiple times that she did not want to go.  She declines going by EMS but is willing to go to the hospital today.  She is going any significant workup glucose controlled fluid resuscitation.  She is being transported to the hospital by her husband we had to place her in a wheelchair to get her into the car.  In her fall also related to her dehydration and some dizziness.  She has some decreased range of motion in that upper extremity this can be x-rayed at the hospital.      Relevant Orders   Glucose, fingerstick (stat)    Other Visit Diagnoses    Viral illness  Relevant Orders   Influenza A and B Ag, Immunoassay (Completed)   Left shoulder pain, unspecified chronicity       Dehydration          Note: This dictation was prepared with Dragon dictation along with smaller phrase technology. Any transcriptional errors that result from this process are unintentional.

## 2017-07-15 NOTE — ED Provider Notes (Signed)
[facility] EMERGENCY DEPARTMENT Provider Note   CSN: [id number] Arrival date & time: 07/15/17  1521     History   Chief Complaint Chief Complaint  Patient presents with  . Hyperglycemia    HPI Hind Chesler is a 67 y.o. female.  Plate is high blood sugars, vomiting, fell on her shoulder.  HPI  67 year old female.  History of diabetes.  Has had a 10-day illness.  Intermittent fevers.  Nausea and vomiting.  Weakness.  Poor appetite.  Has been tapering her insulin based on her intake.  She been attempting to take fruit Gatorade.  She is lost several pounds over the last few weeks.  Seen by primary care physician today.  Was clinically dehydrated.  Blood sugar was above the reading of the Accu-Chek at the physician's office and was referred here for further care.  On Friday night, 5 days ago she was weak and stood up.  She was orthostatic and fell and hit her shoulder.  She has some pain in her shoulder although she been able to move it.  No specific or localizing abdominal pain.  No chest pain.  Did not strike her head.  She denies any dysuria.  She has had marked polyuria and nocturia as well.  Blood sugars at home have been "high" for the last 2 days.    Past Medical History:  Diagnosis Date  . Acute MI ([place]) 1999; 2007  . Anemia    hx  . Anginal pain ([place])   . Anxiety   . Arthritis    "generalized" (03/15/2014)  . CAD (coronary artery disease)    MI in 2000 - MI  2007 - treated bare metal stent (no nuclear since then as 9/11)  . Carotid artery disease ([place])   . CHF (congestive heart failure) ([place])   . Chronic diastolic heart failure (HCC)    a) ECHO (08/2013) EF 55-60% and RV function nl b) RHC (08/2013) RA 4, RV 30/5/7, PA 25/10 (16), PCWP 7, Fick CO/CI 6.3/2.7, PVR 1.5 WU, PA 61 and 66%  . Daily headache    "~ every other day; since I fell in June" (03/15/2014)  . Depression   . Dyslipidemia   . Exertional shortness of breath   . HTN (hypertension)     . Hypothyroidism   . Obesity   . Osteoarthritis   . Peripheral neuropathy   . PONV (postoperative nausea and vomiting)   . RBBB (right bundle branch block)    Old  . Renal insufficiency    DENIES  . Stroke [facility])    mini strokes  . Syncope    likely due to low blood sugar  . Tachycardia    Sinus tachycardia  . Type II diabetes mellitus ([place])   . Urinary incontinence   . Venous insufficiency     Patient Active Problem List   Diagnosis Date Noted  . Leg cramps 02/27/2017  . Peripheral edema 01/12/2017  . Diabetic neuropathy ([place]) 11/12/2016  . Weakness generalized   . 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 mellitus ([place]) 10/24/2015  . Absolute anemia 10/03/2015  . Generalized anxiety disorder 10/03/2015  . Insomnia 10/03/2015  . Gastroenteritis 06/07/2015  . Diabetes mellitus type II, uncontrolled ([place]) 06/07/2015  . Chronic diastolic CHF (congestive heart failure) ([place]) 06/07/2015  . Non compliance with medical treatment 04/17/2014  . Rotator cuff tear 03/14/2014  . Morbid obesity ([place]) 09/23/2013  . Generalized weakness 12/25/2012  . Anxiety and depression 12/25/2012  .  Urinary incontinence   . MDD (major depressive disorder) 11/12/2010  . RBBB (right bundle branch block)   . Tachycardia   . CAD (coronary artery disease)   . Hyperlipemia 01/22/2009  . Essential hypertension 01/22/2009  . Shortness of breath 01/22/2009    Past Surgical History:  Procedure Laterality Date  . ABDOMINAL HYSTERECTOMY  1980's  . CATARACT EXTRACTION, BILATERAL Bilateral ?2013  . CORONARY ANGIOPLASTY WITH STENT PLACEMENT  1999; 2007   "1 + 1"  . KNEE ARTHROSCOPY Left 10/25/2006  . RIGHT HEART CATHETERIZATION N/A 09/22/2013   Procedure: RIGHT HEART CATH;  Surgeon: Jolaine Artist, MD;  Location: [facility] CATH LAB;  Service: Cardiovascular;  Laterality: N/A;  . SHOULDER ARTHROSCOPY W/ ROTATOR CUFF REPAIR Right 03/14/2014  . SHOULDER ARTHROSCOPY WITH OPEN ROTATOR CUFF REPAIR Right 03/14/2014    Procedure: RIGHT SHOULDER ARTHROSCOPY WITH BICEPS RELEASE, OPEN SUBSCAPULA REPAIR, OPEN SUPRASPINATUS REPAIR.;  Surgeon: Meredith Pel, MD;  Location: [facility];  Service: Orthopedics;  Laterality: Right;  . TUBAL LIGATION  1970's    OB History    No data available       Home Medications    Prior to Admission medications   Medication Sig Start Date End Date Taking? Authorizing Provider  albuterol (PROVENTIL HFA;VENTOLIN HFA) 108 (90 BASE) MCG/ACT inhaler Inhale 2 puffs into the lungs every 6 (six) hours as needed for wheezing or shortness of breath. Reported on 10/30/2015   Yes [provider]  aspirin EC 81 MG tablet Take 81 mg by mouth daily.   Yes [provider]  carvedilol (COREG) 12.5 MG tablet Take 1 tablet (12.5 mg total) by mouth 2 (two) times daily with a meal. 07/07/17  Yes Dixon, Mary B, PA-C  clonazePAM (KLONOPIN) 1 MG tablet TAKE 1 TABLET BY MOUTH THREE TIMES A DAY Patient taking differently: TAKE 1mg   TABLET BY MOUTH THREE TIMES A DAY 05/13/17  Yes [place], [name], MD  clopidogrel (PLAVIX) 75 MG tablet TAKE 1 TABLET (75 MG TOTAL) BY MOUTH DAILY WITH BREAKFAST. 03/04/17  Yes Larey Dresser, MD  ferrous sulfate 325 (65 FE) MG tablet Take 325 mg by mouth daily with breakfast. Reported on 08/21/2015   Yes [provider]  insulin NPH Human (HUMULIN N,NOVOLIN N) 100 UNIT/ML injection Inject 75 units in the morning and 6 units at bedtime   Yes [provider]  insulin regular (NOVOLIN R,HUMULIN R) 250 units/2.19mL (100 units/mL) injection Inject 5-40 Units into the skin 3 (three) times daily before meals. Sliding scale   Yes [provider]  isosorbide dinitrate (ISORDIL) 10 MG tablet Take 1 tablet (10 mg total) by mouth 2 (two) times daily. 01/16/17  Yes Larey Dresser, MD  levothyroxine (SYNTHROID, LEVOTHROID) 50 MCG tablet Take 1 tablet (50 mcg total) by mouth daily before breakfast. 11/07/16  Yes Dena Billet B, PA-C  losartan  (COZAAR) 50 MG tablet TAKE 1 TABLET BY MOUTH EVERY DAY Patient taking differently: TAKE 50 mg  TABLET BY MOUTH EVERY DAY 03/09/17  Yes [place], [name], MD  Magnesium Oxide 400 (240 Mg) MG TABS TAKE 1 TABLET BY MOUTH EVERY DAY Patient taking differently: TAKE 240 mg TABLET BY MOUTH EVERY DAY 06/23/17  Yes [name], MD  metolazone (ZAROXOLYN) 2.5 MG tablet Take 1 tablet (2.5 mg total) by mouth once a week. 05/14/17  Yes Bensimhon, Shaune Pascal, MD  nitroGLYCERIN (NITROSTAT) 0.4 MG SL tablet PLACE 1 TABLET (0.4 MG TOTAL) UNDER THE TONGUE EVERY 5 (FIVE) MINUTES  AS NEEDED FOR CHEST PAIN. 08/28/16  Yes [name], PA-C  PARoxetine (PAXIL-CR) 25 MG 24 hr tablet Take 25 mg by mouth daily. Take with 37.5 mg tablet daily (total 62.5 mg)   Yes [provider]  PARoxetine (PAXIL-CR) 37.5 MG 24 hr tablet Take 37.5 mg by mouth daily. Take with 25 mg tablet daily (total 62.5 mg)   Yes [provider]  potassium chloride (K-DUR) 10 MEQ tablet Take 1 tablet (10 mEq total) by mouth daily. Take with the water pill in the morning 02/27/17  Yes [place], [name], MD  pregabalin (LYRICA) 150 MG capsule Take 1 capsule (150 mg total) by mouth 2 (two) times daily. 11/12/16  Yes [place], [name], MD  rosuvastatin (CRESTOR) 20 MG tablet Take 1 tablet (20 mg total) by mouth daily. 11/20/16  Yes [place], [name], MD  torsemide (DEMADEX) 20 MG tablet Take 5 tablets (100 mg total) by mouth 2 (two) times daily. 01/13/17  Yes Clegg, Amy D, NP  metolazone (ZAROXOLYN) 2.5 MG tablet Take 1 tablet (2.5 mg total) by mouth once a week. Patient not taking: Reported on 07/15/2017 06/02/17   Bensimhon, Shaune Pascal, MD  PARoxetine (PAXIL-CR) 37.5 MG 24 hr tablet TAKE 1 TABLET BY MOUTH EVERY DAY Patient not taking: Reported on 07/15/2017 06/23/17   Alycia Rossetti, MD    Family History Family History  Problem Relation Age of Onset  . Heart attack Mother   . Coronary artery disease Unknown     Social History Social  History   Tobacco Use  . Smoking status: Former Smoker    Packs/day: 3.00    Years: 32.00    Pack years: 96.00    Types: Cigarettes    Last attempt to quit: 10/24/1997    Years since quitting: 19.7  . Smokeless tobacco: Never Used  Substance Use Topics  . Alcohol use: Yes    Comment: "might have 2-3 daiquiris in the summer"  . Drug use: No     Allergies   Codeine   Review of Systems Review of Systems  Constitutional: Negative for appetite change, chills, diaphoresis, fatigue and fever.  HENT: Negative for mouth sores, sore throat and trouble swallowing.   Eyes: Negative for visual disturbance.  Respiratory: Negative for cough, chest tightness, shortness of breath and wheezing.   Cardiovascular: Negative for chest pain.  Gastrointestinal: Positive for abdominal pain, diarrhea, nausea and vomiting. Negative for abdominal distention.  Endocrine: Positive for polydipsia and polyuria. Negative for polyphagia.  Genitourinary: Negative for dysuria, frequency and hematuria.  Musculoskeletal: Negative for gait problem.  Skin: Negative for color change, pallor and rash.  Neurological: Negative for dizziness, syncope, light-headedness and headaches.  Hematological: Does not bruise/bleed easily.  Psychiatric/Behavioral: Negative for behavioral problems and confusion.     Physical Exam Updated Vital Signs Pulse 80   Temp 97.8 F (36.6 C) (Oral)   Resp 18   SpO2 93%   Physical Exam  Constitutional: She is oriented to person, place, and time. No distress.  Appears to not feel well. No t toxic. Mentating well. Tongue dry.  HENT:  Head: Normocephalic.  Eyes: Conjunctivae are normal. Pupils are equal, round, and reactive to light. No scleral icterus.  Neck: Normal range of motion. Neck supple. No thyromegaly present.  Cardiovascular: Normal rate and regular rhythm. Exam reveals no gallop and no friction rub.  No murmur heard. Pulmonary/Chest: Effort normal and breath sounds  normal. No respiratory distress. She has no wheezes. She has  no rales.  Abdominal: Soft. Bowel sounds are normal. She exhibits no distension. There is no tenderness. There is no rebound.  Abdomen benign.  No focal tenderness.  Bowel sounds present.  Musculoskeletal: Normal range of motion.  Neurological: She is alert and oriented to person, place, and time.  Skin: Skin is warm and dry. No rash noted.  Psychiatric: She has a normal mood and affect. Her behavior is normal.     ED Treatments / Results  Labs (all labs ordered are listed, but only abnormal results are displayed) Labs Reviewed  COMPREHENSIVE METABOLIC PANEL - Abnormal; Notable for the following components:      Result Value   Sodium 120 (*)    Chloride 83 (*)    Glucose, Bld 1,079 (*)    BUN 57 (*)    Creatinine, Ser 2.98 (*)    Calcium 8.6 (*)    Albumin 3.0 (*)    ALT 11 (*)    Alkaline Phosphatase 146 (*)    GFR calc non Af Amer 15 (*)    GFR calc Af Amer 18 (*)    All other components within normal limits  CBC - Abnormal; Notable for the following components:   Hemoglobin 10.7 (*)    HCT 35.3 (*)    RDW 16.5 (*)    All other components within normal limits  URINALYSIS, ROUTINE W REFLEX MICROSCOPIC - Abnormal; Notable for the following components:   Glucose, UA >=500 (*)    Hgb urine dipstick MODERATE (*)    Protein, ur 30 (*)    Bacteria, UA RARE (*)    Squamous Epithelial / LPF 0-5 (*)    All other components within normal limits  I-STAT CHEM 8, ED - Abnormal; Notable for the following components:   Sodium 127 (*)    Chloride 93 (*)    BUN 59 (*)    Creatinine, Ser 2.20 (*)    Glucose, Bld >700 (*)    Calcium, Ion 0.93 (*)    Hemoglobin 9.2 (*)    HCT 27.0 (*)    All other components within normal limits  CBG MONITORING, ED - Abnormal; Notable for the following components:   Glucose-Capillary >600 (*)    All other components within normal limits  URINE CULTURE  I-STAT CG4 LACTIC ACID, ED  I-STAT  TROPONIN, ED    EKG  EKG Interpretation None       Radiology Dg Shoulder Left  Result Date: 07/15/2017 CLINICAL DATA:  [place] on floor 5 days ago. Left shoulder pain. Initial encounter. EXAM: LEFT SHOULDER - 2+ VIEW COMPARISON:  None. FINDINGS: There is no evidence of fracture or dislocation. Mild acromioclavicular joint degenerative changes. No other bone lesions identified. Soft tissues are unremarkable. IMPRESSION: No acute findings. Mild acromioclavicular DJD. Electronically Signed   By: Earle Gell M.D.   On: 07/15/2017 19:49    Procedures Procedures (including critical care time)  Medications Ordered in ED Medications  dextrose 5 %-0.45 % sodium chloride infusion (not administered)  insulin regular (NOVOLIN R,HUMULIN R) 100 Units in sodium chloride 0.9 % 100 mL (1 Units/mL) infusion (5.4 Units/hr Intravenous New Bag/Given 07/15/17 2104)  sodium chloride 0.9 % bolus 500 mL (0 mLs Intravenous Stopped 07/15/17 1945)  sodium chloride 0.9 % bolus 2,000 mL (2,000 mLs Intravenous New Bag/Given 07/15/17 2005)     Initial Impression / Assessment and Plan / ED Course  I have reviewed the triage vital signs and the nursing notes.  Pertinent labs & imaging  results that were available during my care of the patient were reviewed by me and considered in my medical decision making (see chart for details).   Blood sugar reads high on Accu-Chek.  She is 1079 testing.  Her baseline creatinine is around 2.  She is 2.98.  Normal anion gap.  Pseudohyponatremia 127.  Given fluid bolus.  Additional fluids ordered.  Is on glucose stabilizer infusion.  Urine dilute.  Placed call to hospitalist regarding admission.  X-ray of her shoulder were obtained.  No fracture or acute abnormalities noted.  Plan admission for hydration.  Has been given antiemetics here and is improving with nausea.  Abdomen remains benign.  Final Clinical Impressions(s) / ED Diagnoses   Final diagnoses:  Hyperglycemia   Gastroenteritis    ED Discharge Orders    None       Tanna Furry, MD 07/15/17 2159

## 2017-07-15 NOTE — ED Notes (Signed)
Unable to provide sample in triage. Pt given a urine cup to fill when able.

## 2017-07-16 ENCOUNTER — Encounter (HOSPITAL_COMMUNITY): Payer: Self-pay | Admitting: Emergency Medicine

## 2017-07-16 ENCOUNTER — Inpatient Hospital Stay (HOSPITAL_COMMUNITY): Payer: PPO

## 2017-07-16 ENCOUNTER — Other Ambulatory Visit: Payer: Self-pay

## 2017-07-16 DIAGNOSIS — R739 Hyperglycemia, unspecified: Secondary | ICD-10-CM | POA: Diagnosis not present

## 2017-07-16 DIAGNOSIS — I5032 Chronic diastolic (congestive) heart failure: Secondary | ICD-10-CM

## 2017-07-16 DIAGNOSIS — R197 Diarrhea, unspecified: Secondary | ICD-10-CM

## 2017-07-16 DIAGNOSIS — G8929 Other chronic pain: Secondary | ICD-10-CM | POA: Diagnosis not present

## 2017-07-16 DIAGNOSIS — E1122 Type 2 diabetes mellitus with diabetic chronic kidney disease: Secondary | ICD-10-CM | POA: Diagnosis not present

## 2017-07-16 DIAGNOSIS — N184 Chronic kidney disease, stage 4 (severe): Secondary | ICD-10-CM

## 2017-07-16 DIAGNOSIS — I2510 Atherosclerotic heart disease of native coronary artery without angina pectoris: Secondary | ICD-10-CM

## 2017-07-16 DIAGNOSIS — R112 Nausea with vomiting, unspecified: Secondary | ICD-10-CM

## 2017-07-16 DIAGNOSIS — E1101 Type 2 diabetes mellitus with hyperosmolarity with coma: Secondary | ICD-10-CM

## 2017-07-16 DIAGNOSIS — I10 Essential (primary) hypertension: Secondary | ICD-10-CM | POA: Diagnosis not present

## 2017-07-16 DIAGNOSIS — M25562 Pain in left knee: Secondary | ICD-10-CM | POA: Diagnosis not present

## 2017-07-16 LAB — BASIC METABOLIC PANEL
Anion gap: 11 (ref 5–15)
Anion gap: 11 (ref 5–15)
Anion gap: 11 (ref 5–15)
BUN: 52 mg/dL — ABNORMAL HIGH (ref 6–20)
BUN: 51 mg/dL — ABNORMAL HIGH (ref 6–20)
BUN: 47 mg/dL — ABNORMAL HIGH (ref 6–20)
CO2: 26 mmol/L (ref 22–32)
CO2: 26 mmol/L (ref 22–32)
CO2: 23 mmol/L (ref 22–32)
Calcium: 8.9 mg/dL (ref 8.9–10.3)
Calcium: 8.7 mg/dL — ABNORMAL LOW (ref 8.9–10.3)
Calcium: 8.7 mg/dL — ABNORMAL LOW (ref 8.9–10.3)
Chloride: 98 mmol/L — ABNORMAL LOW (ref 101–111)
Chloride: 98 mmol/L — ABNORMAL LOW (ref 101–111)
Chloride: 103 mmol/L (ref 101–111)
Creatinine, Ser: 2.53 mg/dL — ABNORMAL HIGH (ref 0.44–1.00)
Creatinine, Ser: 2.46 mg/dL — ABNORMAL HIGH (ref 0.44–1.00)
Creatinine, Ser: 2.18 mg/dL — ABNORMAL HIGH (ref 0.44–1.00)
GFR calc Af Amer: 21 mL/min — ABNORMAL LOW (ref >60)
GFR calc Af Amer: 22 mL/min — ABNORMAL LOW (ref >60)
GFR calc Af Amer: 26 mL/min — ABNORMAL LOW (ref >60)
GFR calc non Af Amer: 18 mL/min — ABNORMAL LOW (ref >60)
GFR calc non Af Amer: 19 mL/min — ABNORMAL LOW (ref >60)
GFR calc non Af Amer: 22 mL/min — ABNORMAL LOW (ref >60)
Glucose, Bld: 237 mg/dL — ABNORMAL HIGH (ref 65–99)
Glucose, Bld: 146 mg/dL — ABNORMAL HIGH (ref 65–99)
Glucose, Bld: 139 mg/dL — ABNORMAL HIGH (ref 65–99)
Potassium: 3.8 mmol/L (ref 3.5–5.1)
Potassium: 3.6 mmol/L (ref 3.5–5.1)
Potassium: 3.7 mmol/L (ref 3.5–5.1)
Sodium: 135 mmol/L (ref 135–145)
Sodium: 135 mmol/L (ref 135–145)
Sodium: 137 mmol/L (ref 135–145)

## 2017-07-16 LAB — CBC
HCT: 30.7 % — ABNORMAL LOW (ref 36.0–46.0)
Hemoglobin: 10.1 g/dL — ABNORMAL LOW (ref 12.0–15.0)
MCH: 25.4 pg — ABNORMAL LOW (ref 26.0–34.0)
MCHC: 32.9 g/dL (ref 30.0–36.0)
MCV: 77.1 fL — ABNORMAL LOW (ref 78.0–100.0)
Platelets: 173 10*3/uL (ref 150–400)
RBC: 3.98 MIL/uL (ref 3.87–5.11)
RDW: 15.1 % (ref 11.5–15.5)
WBC: 4.7 10*3/uL (ref 4.0–10.5)

## 2017-07-16 LAB — CBG MONITORING, ED
Glucose-Capillary: 170 mg/dL — ABNORMAL HIGH (ref 65–99)
Glucose-Capillary: 138 mg/dL — ABNORMAL HIGH (ref 65–99)
Glucose-Capillary: 144 mg/dL — ABNORMAL HIGH (ref 65–99)
Glucose-Capillary: 128 mg/dL — ABNORMAL HIGH (ref 65–99)
Glucose-Capillary: 136 mg/dL — ABNORMAL HIGH (ref 65–99)
Glucose-Capillary: 120 mg/dL — ABNORMAL HIGH (ref 65–99)
Glucose-Capillary: 295 mg/dL — ABNORMAL HIGH (ref 65–99)
Glucose-Capillary: 118 mg/dL — ABNORMAL HIGH (ref 65–99)

## 2017-07-16 LAB — HEMOGLOBIN A1C
Hgb A1c MFr Bld: 11.4 % — ABNORMAL HIGH (ref 4.8–5.6)
Mean Plasma Glucose: 280.48 mg/dL

## 2017-07-16 LAB — GLUCOSE, CAPILLARY
Glucose-Capillary: 110 mg/dL — ABNORMAL HIGH (ref 65–99)
Glucose-Capillary: 117 mg/dL — ABNORMAL HIGH (ref 65–99)
Glucose-Capillary: 140 mg/dL — ABNORMAL HIGH (ref 65–99)

## 2017-07-16 MED ORDER — PAROXETINE HCL ER 37.5 MG PO TB24
62.5000 mg | ORAL_TABLET | Freq: Every day | ORAL | Status: DC
  Filled 2017-07-16: qty 1

## 2017-07-16 MED ORDER — LEVOTHYROXINE SODIUM 50 MCG PO TABS
50.0000 ug | ORAL_TABLET | Freq: Every day | ORAL | Status: DC
  Administered 2017-07-16 – 2017-07-19 (×4): 50 ug via ORAL
  Filled 2017-07-16 (×5): qty 1

## 2017-07-16 MED ORDER — FERROUS SULFATE 325 (65 FE) MG PO TABS
325.0000 mg | ORAL_TABLET | Freq: Every day | ORAL | Status: DC
  Administered 2017-07-17 – 2017-07-19 (×3): 325 mg via ORAL
  Filled 2017-07-16 (×5): qty 1

## 2017-07-16 MED ORDER — SODIUM CHLORIDE 0.9 % IV BOLUS (SEPSIS)
1000.0000 mL | Freq: Once | INTRAVENOUS | Status: AC
  Administered 2017-07-16: 1000 mL via INTRAVENOUS

## 2017-07-16 MED ORDER — INSULIN ASPART 100 UNIT/ML ~~LOC~~ SOLN
0.0000 [IU] | Freq: Every day | SUBCUTANEOUS | Status: DC
  Administered 2017-07-17: 5 [IU] via SUBCUTANEOUS
  Administered 2017-07-18: 4 [IU] via SUBCUTANEOUS

## 2017-07-16 MED ORDER — PAROXETINE HCL ER 37.5 MG PO TB24
62.5000 mg | ORAL_TABLET | Freq: Every day | ORAL | Status: DC
  Administered 2017-07-17 – 2017-07-19 (×3): 62.5 mg via ORAL
  Filled 2017-07-16 (×4): qty 1

## 2017-07-16 MED ORDER — INSULIN NPH (HUMAN) (ISOPHANE) 100 UNIT/ML ~~LOC~~ SUSP
70.0000 [IU] | Freq: Every day | SUBCUTANEOUS | Status: DC
  Administered 2017-07-16 – 2017-07-19 (×4): 70 [IU] via SUBCUTANEOUS
  Filled 2017-07-16: qty 10

## 2017-07-16 MED ORDER — INSULIN ASPART 100 UNIT/ML ~~LOC~~ SOLN
0.0000 [IU] | Freq: Three times a day (TID) | SUBCUTANEOUS | Status: DC
  Administered 2017-07-17 (×2): 3 [IU] via SUBCUTANEOUS
  Administered 2017-07-18: 8 [IU] via SUBCUTANEOUS
  Administered 2017-07-18: 5 [IU] via SUBCUTANEOUS
  Administered 2017-07-18: 8 [IU] via SUBCUTANEOUS
  Administered 2017-07-19: 15 [IU] via SUBCUTANEOUS

## 2017-07-16 MED ORDER — ROSUVASTATIN CALCIUM 20 MG PO TABS
20.0000 mg | ORAL_TABLET | Freq: Every day | ORAL | Status: DC
  Administered 2017-07-16 – 2017-07-19 (×4): 20 mg via ORAL
  Filled 2017-07-16 (×5): qty 1

## 2017-07-16 NOTE — ED Notes (Signed)
Case management at bedside.

## 2017-07-16 NOTE — ED Notes (Signed)
Report attempted 

## 2017-07-16 NOTE — ED Notes (Signed)
Patient is hooked to the monitor with call bell in reach

## 2017-07-16 NOTE — ED Notes (Signed)
X-ray at bedside

## 2017-07-16 NOTE — ED Notes (Signed)
The pt has been moved to a regular hosp bed for comfort

## 2017-07-16 NOTE — ED Notes (Signed)
pts glucose 144  Insulin drip discd d5 1/2 nss discd  nss continued

## 2017-07-16 NOTE — ED Notes (Signed)
CBG: 118. RN notified.

## 2017-07-16 NOTE — Discharge Summary (Addendum)
Physician Discharge Summary  Susan [name] DJT:[id number] DOB: [birth year] DOA: 07/15/2017  PCP: Alycia Rossetti, MD  Admit date: 07/15/2017 Discharge date: 07/18/2017  Admitted From: home Disposition:  home   Recommendations for Outpatient Follow-up:  1. A1c  Noted to be 11.4 signifying poor control of diabetes- please f/u on this 2. It is recommended that she go to SNF however she is declining this   Discharge Condition:  stable   CODE STATUS:  Full code   Consultations:  none    Discharge Diagnoses:  Principal Problem:   Hyperosmolar non-ketotic state in patient with type 2 diabetes mellitus ([place]) Active Problems:   Nausea vomiting and diarrhea   Essential hypertension   CAD (coronary artery disease)   Chronic diastolic CHF (congestive heart failure) ([place])   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 mellitus ([facility])    Subjective: No diarrhea since yesterday prior to coming into the ER. No nausea or vomiting and no abdominal pain.   HPI: Susan [name] is a 67 y.o. female with history of chronic diastolic CHF, CAD status post bare-metal stent placement, hypothyroidism, chronic kidney disease, chronic anemia probably from renal disease was referred to the ER by patient's primary care physician after patient's blood sugar was found to be unrecordable.  Patient was placed on antibiotics for oral infection 2 weeks ago which patient completed last week.  Patient took 1 whole week of antibiotic.  Does not recall the name.  After the antibiotic course was overall patient was having persistent diarrhea with nausea vomiting.  Diarrhea was clear with no blood.  Patient was unable to eat anything because of fear of diarrhea.  Denies any abdominal pain chest pain or shortness of breath.  Patient states he has been taking a long-acting insulin but cut short on her sliding scale because she was not eating anything.  Due to persistent weakness nausea vomiting and diarrhea patient went to her primary care  physician who checked her blood sugar and was found to be markedly elevated and was referred to the ER.  Hospital Course:  DM2  -   she states that she is reasonably controlled on her home doses of insulin however, while she was having diarrhea, she was not taking her regular insulin but was taking her N -  She has successfully been transitioned from the insulin infusion to s/c insulin   - on note, her A1c is 11.4 signifying poor control and per notes from her PCP, the patient is poorly compliant with her diet  - I have increased her bedtime dose of NPH to 40 U  Diarrhea - her GI symptoms have resolved and is tolerating solid food without discomfort  Dehydration - has been given > 3 L of NS since yesterday to help correct this- no signs of acute heart failure  Chronic dCHF - cont diuretics at home doses  CKD 4 - stable  Left knee pain - this is a chronic issue per the patient- f/u with ortho as outpt  Discharge Exam: Vitals:   07/18/17 0820 07/18/17 1248  BP: (!) 128/41 (!) 129/49  Pulse: 83 87  Resp: 16 18  Temp: 98.3 F (36.8 C) 98.1 F (36.7 C)  SpO2: 93% 94%   Vitals:   07/18/17 0300 07/18/17 0626 07/18/17 0820 07/18/17 1248  BP:  (!) 126/57 (!) 128/41 (!) 129/49  Pulse:  82 83 87  Resp:  18 16 18   Temp:  98.5 F (36.9 C) 98.3 F (36.8 C) 98.1  F (36.7 C)  TempSrc:  Oral Oral Oral  SpO2:  92% 93% 94%  Weight: 131.6 kg (290 lb 2 oz)     Height:        General: Pt is alert, awake, not in acute distress Cardiovascular: RRR, S1/S2 +, no rubs, no gallops Respiratory: CTA bilaterally, no wheezing, no rhonchi Abdominal: Soft, NT, ND, bowel sounds + Extremities: no edema, no cyanosis   Discharge Instructions  Discharge Instructions    Diet - low sodium heart healthy   Complete by:  As directed    Diet - low sodium heart healthy   Complete by:  As directed    Diet Carb Modified   Complete by:  As directed    Diet Carb Modified   Complete by:  As directed     Increase activity slowly   Complete by:  As directed    Increase activity slowly   Complete by:  As directed      Allergies as of 07/18/2017      Reactions   Codeine Nausea And Vomiting      Medication List    TAKE these medications   albuterol 108 (90 Base) MCG/ACT inhaler Commonly known as:  PROVENTIL HFA;VENTOLIN HFA Inhale 2 puffs into the lungs every 6 (six) hours as needed for wheezing or shortness of breath. Reported on 10/30/2015   aspirin EC 81 MG tablet Take 81 mg by mouth daily.   carvedilol 12.5 MG tablet Commonly known as:  COREG Take 1 tablet (12.5 mg total) by mouth 2 (two) times daily with a meal.   cefUROXime 500 MG tablet Commonly known as:  CEFTIN Take 1 tablet (500 mg total) by mouth 2 (two) times daily with a meal.   clonazePAM 1 MG tablet Commonly known as:  KLONOPIN TAKE 1 TABLET BY MOUTH THREE TIMES A DAY What changed:    how much to take  how to take this  when to take this   clopidogrel 75 MG tablet Commonly known as:  PLAVIX TAKE 1 TABLET (75 MG TOTAL) BY MOUTH DAILY WITH BREAKFAST.   ferrous sulfate 325 (65 FE) MG tablet Take 325 mg by mouth daily with breakfast. Reported on 08/21/2015   insulin NPH Human 100 UNIT/ML injection Commonly known as:  HUMULIN N,NOVOLIN N Inject 75 units in the morning and 6 units at bedtime   insulin regular 100 units/mL injection Commonly known as:  NOVOLIN R,HUMULIN R Inject 5-40 Units into the skin 3 (three) times daily before meals. Sliding scale   isosorbide dinitrate 10 MG tablet Commonly known as:  ISORDIL Take 1 tablet (10 mg total) by mouth 2 (two) times daily.   levothyroxine 50 MCG tablet Commonly known as:  SYNTHROID, LEVOTHROID Take 1 tablet (50 mcg total) by mouth daily before breakfast.   losartan 50 MG tablet Commonly known as:  COZAAR TAKE 1 TABLET BY MOUTH EVERY DAY What changed:    how much to take  how to take this  when to take this   Magnesium Oxide 400 (240 Mg) MG  Tabs TAKE 1 TABLET BY MOUTH EVERY DAY What changed:    how much to take  how to take this  when to take this   metolazone 2.5 MG tablet Commonly known as:  ZAROXOLYN Take 1 tablet (2.5 mg total) by mouth once a week.   metolazone 2.5 MG tablet Commonly known as:  ZAROXOLYN Take 1 tablet (2.5 mg total) by mouth once a week.   nitroGLYCERIN  0.4 MG SL tablet Commonly known as:  NITROSTAT PLACE 1 TABLET (0.4 MG TOTAL) UNDER THE TONGUE EVERY 5 (FIVE) MINUTES AS NEEDED FOR CHEST PAIN.   PARoxetine 37.5 MG 24 hr tablet Commonly known as:  PAXIL-CR Take 37.5 mg by mouth daily. Take with 25 mg tablet daily (total 62.5 mg)   PARoxetine 25 MG 24 hr tablet Commonly known as:  PAXIL-CR Take 25 mg by mouth daily. Take with 37.5 mg tablet daily (total 62.5 mg)   PARoxetine 37.5 MG 24 hr tablet Commonly known as:  PAXIL-CR TAKE 1 TABLET BY MOUTH EVERY DAY   potassium chloride 10 MEQ tablet Commonly known as:  K-DUR Take 1 tablet (10 mEq total) by mouth daily. Take with the water pill in the morning   pregabalin 150 MG capsule Commonly known as:  LYRICA Take 1 capsule (150 mg total) by mouth 2 (two) times daily.   rosuvastatin 20 MG tablet Commonly known as:  CRESTOR Take 1 tablet (20 mg total) by mouth daily.   torsemide 20 MG tablet Commonly known as:  DEMADEX Take 5 tablets (100 mg total) by mouth 2 (two) times daily.      Follow-up Information    [facility]   Specialty:  [place] Why:  They will do your home health care at your home Contact information: [place] [address] [phone]          Allergies  Allergen Reactions  . Codeine Nausea And Vomiting     Procedures/Studies:   Dg Chest Port 1 View  Result Date: 07/16/2017 CLINICAL DATA:  Hyperglycemia and dehydration EXAM: PORTABLE CHEST 1 VIEW COMPARISON:  07/28/2016 FINDINGS: Mild bronchitic changes at the bases. No focal opacity or  effusion. Stable cardiomediastinal silhouette. No pneumothorax. IMPRESSION: No active disease.  Stable mild bronchitic changes Electronically Signed   By: Donavan Foil M.D.   On: 07/16/2017 00:23   Dg Shoulder Left  Result Date: 07/15/2017 CLINICAL DATA:  [place] on floor 5 days ago. Left shoulder pain. Initial encounter. EXAM: LEFT SHOULDER - 2+ VIEW COMPARISON:  None. FINDINGS: There is no evidence of fracture or dislocation. Mild acromioclavicular joint degenerative changes. No other bone lesions identified. Soft tissues are unremarkable. IMPRESSION: No acute findings. Mild acromioclavicular DJD. Electronically Signed   By: Earle Gell M.D.   On: 07/15/2017 19:49   Dg Knee Complete 4 Views Left  Result Date: 07/17/2017 CLINICAL DATA:  Entire knee pain.  Unable to walk EXAM: LEFT KNEE - COMPLETE 4+ VIEW COMPARISON:  None. FINDINGS: Tricompartmental osteoarthritis with advanced tibiofemoral joint narrowing and subchondral sclerosis. There is generalized degenerative spurring. There is lateral translation of the tibial plateau. No evidence of bone lesion or fracture deformity. 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 Electronically Signed   By: Monte Fantasia M.D.   On: 07/17/2017 12:30     The results of significant diagnostics from this hospitalization (including imaging, microbiology, ancillary and laboratory) are listed below for reference.     Microbiology: Recent Results (from the past 240 hour(s))  Urine Culture     Status: Abnormal   Collection Time: 07/15/17  7:45 PM  Result Value Ref Range Status   Specimen Description URINE, CLEAN CATCH  Final   Special Requests   Final    NONE Performed at [facility], [address] [place], [place] [address]    Culture >=100,000 COLONIES/mL KLEBSIELLA PNEUMONIAE (A)  Final  Report Status 07/18/2017 FINAL  Final   Organism ID, Bacteria KLEBSIELLA PNEUMONIAE (A)  Final       Susceptibility   Klebsiella pneumoniae - MIC*    AMPICILLIN >=32 RESISTANT Resistant     CEFAZOLIN <=4 SENSITIVE Sensitive     CEFTRIAXONE <=1 SENSITIVE Sensitive     CIPROFLOXACIN <=0.25 SENSITIVE Sensitive     GENTAMICIN <=1 SENSITIVE Sensitive     IMIPENEM <=0.25 SENSITIVE Sensitive     NITROFURANTOIN 64 INTERMEDIATE Intermediate     TRIMETH/SULFA <=20 SENSITIVE Sensitive     AMPICILLIN/SULBACTAM 4 SENSITIVE Sensitive     PIP/TAZO <=4 SENSITIVE Sensitive     Extended ESBL NEGATIVE Sensitive     * >=100,000 COLONIES/mL KLEBSIELLA PNEUMONIAE     Labs: BNP (last 3 results) Recent Labs    01/13/17 1456 02/02/17 1530 04/29/17 1109  BNP 139.5* 131.8* 78.2   Basic Metabolic Panel: Recent Labs  Lab 07/16/17 0056 07/16/17 0349 07/16/17 0933 07/17/17 0811 07/18/17 0401  NA 135 135 137 136 133*  K 3.8 3.6 3.7 3.8 3.5  CL 98* 98* 103 104 100*  CO2 26 26 23  21* 21*  GLUCOSE 237* 146* 139* 201* 258*  BUN 52* 51* 47* 41* 49*  CREATININE 2.53* 2.46* 2.18* 2.22* 2.60*  CALCIUM 8.9 8.7* 8.7* 8.6* 8.4*   Liver Function Tests: Recent Labs  Lab 07/15/17 1557  AST 17  ALT 11*  ALKPHOS 146*  BILITOT 0.8  PROT 7.1  ALBUMIN 3.0*   No results for input(s): LIPASE, AMYLASE in the last 168 hours. No results for input(s): AMMONIA in the last 168 hours. CBC: Recent Labs  Lab 07/15/17 1557 07/15/17 2050 07/16/17 0349  WBC 4.0  --  4.7  HGB 10.7* 9.2* 10.1*  HCT 35.3* 27.0* 30.7*  MCV 86.7  --  77.1*  PLT 170  --  173   Cardiac Enzymes: No results for input(s): CKTOTAL, CKMB, CKMBINDEX, TROPONINI in the last 168 hours. BNP: Invalid input(s): POCBNP CBG: Recent Labs  Lab 07/17/17 1253 07/17/17 1740 07/17/17 2131 07/18/17 0743 07/18/17 1242  GLUCAP 190* 478* 399* 245* 269*   D-Dimer No results for input(s): DDIMER in the last 72 hours. Hgb A1c Recent Labs    07/16/17 0349  HGBA1C 11.4*   Lipid Profile No results for input(s): CHOL, HDL, LDLCALC, TRIG,  CHOLHDL, LDLDIRECT in the last 72 hours. Thyroid function studies No results for input(s): TSH, T4TOTAL, T3FREE, THYROIDAB in the last 72 hours.  Invalid input(s): FREET3 Anemia work up Recent Labs    07/17/17 0811  VITAMINB12 196  FOLATE 17.1  FERRITIN 171  TIBC 214*  IRON 11*  RETICCTPCT 1.9   Urinalysis    Component Value Date/Time   COLORURINE YELLOW 07/15/2017 2009   APPEARANCEUR CLEAR 07/15/2017 2009   LABSPEC 1.012 07/15/2017 2009   PHURINE 5.0 07/15/2017 2009   GLUCOSEU >=500 (A) 07/15/2017 2009   HGBUR MODERATE (A) 07/15/2017 2009   BILIRUBINUR NEGATIVE 07/15/2017 2009   [place] NEGATIVE 07/15/2017 2009   PROTEINUR 30 (A) 07/15/2017 2009   UROBILINOGEN 0.2 04/01/2014 1659   NITRITE NEGATIVE 07/15/2017 2009   LEUKOCYTESUR NEGATIVE 07/15/2017 2009   Sepsis Labs Invalid input(s): PROCALCITONIN,  WBC,  LACTICIDVEN Microbiology Recent Results (from the past 240 hour(s))  Urine Culture     Status: Abnormal   Collection Time: 07/15/17  7:45 PM  Result Value Ref Range Status   Specimen Description URINE, [place]  Final   Special Requests   Final  NONE Performed at [facility], [place] [address] [place], [place] [address]    Culture >=100,000 COLONIES/mL KLEBSIELLA PNEUMONIAE (A)  Final   Report Status 07/18/2017 FINAL  Final   Organism ID, Bacteria KLEBSIELLA PNEUMONIAE (A)  Final      Susceptibility   Klebsiella pneumoniae - MIC*    AMPICILLIN >=32 RESISTANT Resistant     CEFAZOLIN <=4 SENSITIVE Sensitive     CEFTRIAXONE <=1 SENSITIVE Sensitive     CIPROFLOXACIN <=0.25 SENSITIVE Sensitive     GENTAMICIN <=1 SENSITIVE Sensitive     IMIPENEM <=0.25 SENSITIVE Sensitive     NITROFURANTOIN 64 INTERMEDIATE Intermediate     TRIMETH/SULFA <=20 SENSITIVE Sensitive     AMPICILLIN/SULBACTAM 4 SENSITIVE Sensitive     PIP/TAZO <=4 SENSITIVE Sensitive     Extended ESBL NEGATIVE Sensitive     * >=100,000 COLONIES/mL KLEBSIELLA PNEUMONIAE     Time  coordinating discharge: Over 30 minutes  SIGNED:   Debbe Odea, MD  [facility] 07/18/2017, 2:57 PM Pager   If 7PM-7AM, please contact night-coverage [email or web address] Password [email or web address]

## 2017-07-16 NOTE — Progress Notes (Signed)
Inpatient Diabetes Program Recommendations  AACE/ADA: New Consensus Statement on Inpatient Glycemic Control (2015)  Target Ranges:  Prepandial:   less than 140 mg/dL      Peak postprandial:   less than 180 mg/dL (1-2 hours)      Critically ill patients:  140 - 180 mg/dL   Lab Results  Component Value Date   GLUCAP 120 (H) 07/16/2017   HGBA1C 10.2 (H) 02/27/2017    Spoke with patient at bedside. Patient sees Dr. [name], Endocrinologist, for DM control and last saw her a week ago. Dr. [name] made adjustments at that time to insulin regimen. Patient reports taking her NPH even though her glucose level went over 1,000 on admission. Spoke with patient about sick day guidelines and calling Dr. Almetta Lovely office to speak to her about how much Regular insulin to take while she is not able to eat. Patient will need some coverage in the future. Patient thankful for information.  Thanks,  Tama Headings RN, MSN, BC-ADM, [facility] Inpatient Diabetes Coordinator Team Pager [phone] (8a-5p)

## 2017-07-16 NOTE — ED Notes (Signed)
Pt CBG was 120, notified Hayley(RN)

## 2017-07-16 NOTE — ED Notes (Signed)
Pur-wick placed 0300am  Pt voiding in  Suction cannister

## 2017-07-16 NOTE — ED Notes (Signed)
Spoke with Dr. Wynelle Cleveland regarding pt unable to stand and walk. Verbal order given to d/c pt's discharge orders

## 2017-07-16 NOTE — ED Notes (Signed)
Patient is resting on stretcher no complaints at present.  

## 2017-07-17 ENCOUNTER — Observation Stay (HOSPITAL_COMMUNITY): Payer: PPO

## 2017-07-17 DIAGNOSIS — F419 Anxiety disorder, unspecified: Secondary | ICD-10-CM | POA: Diagnosis present

## 2017-07-17 DIAGNOSIS — I5032 Chronic diastolic (congestive) heart failure: Secondary | ICD-10-CM | POA: Diagnosis present

## 2017-07-17 DIAGNOSIS — Z955 Presence of coronary angioplasty implant and graft: Secondary | ICD-10-CM | POA: Diagnosis not present

## 2017-07-17 DIAGNOSIS — I10 Essential (primary) hypertension: Secondary | ICD-10-CM | POA: Diagnosis not present

## 2017-07-17 DIAGNOSIS — D509 Iron deficiency anemia, unspecified: Secondary | ICD-10-CM | POA: Diagnosis present

## 2017-07-17 DIAGNOSIS — Z9841 Cataract extraction status, right eye: Secondary | ICD-10-CM | POA: Diagnosis not present

## 2017-07-17 DIAGNOSIS — F329 Major depressive disorder, single episode, unspecified: Secondary | ICD-10-CM | POA: Diagnosis present

## 2017-07-17 DIAGNOSIS — Z6841 Body Mass Index (BMI) 40.0 and over, adult: Secondary | ICD-10-CM | POA: Diagnosis not present

## 2017-07-17 DIAGNOSIS — D631 Anemia in chronic kidney disease: Secondary | ICD-10-CM | POA: Diagnosis present

## 2017-07-17 DIAGNOSIS — M25562 Pain in left knee: Secondary | ICD-10-CM

## 2017-07-17 DIAGNOSIS — N390 Urinary tract infection, site not specified: Secondary | ICD-10-CM | POA: Diagnosis present

## 2017-07-17 DIAGNOSIS — K529 Noninfective gastroenteritis and colitis, unspecified: Secondary | ICD-10-CM | POA: Diagnosis present

## 2017-07-17 DIAGNOSIS — E1142 Type 2 diabetes mellitus with diabetic polyneuropathy: Secondary | ICD-10-CM | POA: Diagnosis present

## 2017-07-17 DIAGNOSIS — Z8673 Personal history of transient ischemic attack (TIA), and cerebral infarction without residual deficits: Secondary | ICD-10-CM | POA: Diagnosis not present

## 2017-07-17 DIAGNOSIS — I2510 Atherosclerotic heart disease of native coronary artery without angina pectoris: Secondary | ICD-10-CM | POA: Diagnosis present

## 2017-07-17 DIAGNOSIS — E860 Dehydration: Secondary | ICD-10-CM | POA: Diagnosis present

## 2017-07-17 DIAGNOSIS — E1100 Type 2 diabetes mellitus with hyperosmolarity without nonketotic hyperglycemic-hyperosmolar coma (NKHHC): Secondary | ICD-10-CM | POA: Diagnosis present

## 2017-07-17 DIAGNOSIS — E039 Hypothyroidism, unspecified: Secondary | ICD-10-CM | POA: Diagnosis present

## 2017-07-17 DIAGNOSIS — E538 Deficiency of other specified B group vitamins: Secondary | ICD-10-CM | POA: Diagnosis present

## 2017-07-17 DIAGNOSIS — E785 Hyperlipidemia, unspecified: Secondary | ICD-10-CM | POA: Diagnosis present

## 2017-07-17 DIAGNOSIS — M1712 Unilateral primary osteoarthritis, left knee: Secondary | ICD-10-CM | POA: Diagnosis present

## 2017-07-17 DIAGNOSIS — N184 Chronic kidney disease, stage 4 (severe): Secondary | ICD-10-CM | POA: Diagnosis present

## 2017-07-17 DIAGNOSIS — Z9111 Patient's noncompliance with dietary regimen: Secondary | ICD-10-CM | POA: Diagnosis not present

## 2017-07-17 DIAGNOSIS — E1101 Type 2 diabetes mellitus with hyperosmolarity with coma: Secondary | ICD-10-CM | POA: Diagnosis not present

## 2017-07-17 DIAGNOSIS — I252 Old myocardial infarction: Secondary | ICD-10-CM | POA: Diagnosis not present

## 2017-07-17 DIAGNOSIS — R739 Hyperglycemia, unspecified: Secondary | ICD-10-CM | POA: Diagnosis not present

## 2017-07-17 DIAGNOSIS — E1122 Type 2 diabetes mellitus with diabetic chronic kidney disease: Secondary | ICD-10-CM | POA: Diagnosis present

## 2017-07-17 DIAGNOSIS — I130 Hypertensive heart and chronic kidney disease with heart failure and stage 1 through stage 4 chronic kidney disease, or unspecified chronic kidney disease: Secondary | ICD-10-CM | POA: Diagnosis present

## 2017-07-17 LAB — BASIC METABOLIC PANEL
Anion gap: 11 (ref 5–15)
BUN: 41 mg/dL — ABNORMAL HIGH (ref 6–20)
CO2: 21 mmol/L — ABNORMAL LOW (ref 22–32)
Calcium: 8.6 mg/dL — ABNORMAL LOW (ref 8.9–10.3)
Chloride: 104 mmol/L (ref 101–111)
Creatinine, Ser: 2.22 mg/dL — ABNORMAL HIGH (ref 0.44–1.00)
GFR calc Af Amer: 25 mL/min — ABNORMAL LOW (ref >60)
GFR calc non Af Amer: 22 mL/min — ABNORMAL LOW (ref >60)
Glucose, Bld: 201 mg/dL — ABNORMAL HIGH (ref 65–99)
Potassium: 3.8 mmol/L (ref 3.5–5.1)
Sodium: 136 mmol/L (ref 135–145)

## 2017-07-17 LAB — IRON AND TIBC
Iron: 11 ug/dL — ABNORMAL LOW (ref 28–170)
Saturation Ratios: 5 % — ABNORMAL LOW (ref 10.4–31.8)
TIBC: 214 ug/dL — ABNORMAL LOW (ref 250–450)
UIBC: 203 ug/dL

## 2017-07-17 LAB — GLUCOSE, CAPILLARY
Glucose-Capillary: 142 mg/dL — ABNORMAL HIGH (ref 65–99)
Glucose-Capillary: 187 mg/dL — ABNORMAL HIGH (ref 65–99)
Glucose-Capillary: 190 mg/dL — ABNORMAL HIGH (ref 65–99)
Glucose-Capillary: 399 mg/dL — ABNORMAL HIGH (ref 65–99)
Glucose-Capillary: 478 mg/dL — ABNORMAL HIGH (ref 65–99)

## 2017-07-17 LAB — RETICULOCYTES
RBC.: 3.8 MIL/uL — ABNORMAL LOW (ref 3.87–5.11)
Retic Count, Absolute: 72.2 10*3/uL (ref 19.0–186.0)
Retic Ct Pct: 1.9 % (ref 0.4–3.1)

## 2017-07-17 LAB — VITAMIN B12: Vitamin B-12: 196 pg/mL (ref 180–914)

## 2017-07-17 LAB — FOLATE: Folate: 17.1 ng/mL (ref >5.9)

## 2017-07-17 LAB — FERRITIN: Ferritin: 171 ng/mL (ref 11–307)

## 2017-07-17 MED ORDER — INSULIN ASPART 100 UNIT/ML ~~LOC~~ SOLN: 18.0000 [IU] | Freq: Once | SUBCUTANEOUS | Status: DC

## 2017-07-17 MED ORDER — CEFUROXIME AXETIL 500 MG PO TABS
500.0000 mg | ORAL_TABLET | Freq: Two times a day (BID) | ORAL | Status: DC
  Administered 2017-07-17 – 2017-07-19 (×6): 500 mg via ORAL
  Filled 2017-07-17 (×6): qty 1

## 2017-07-17 MED ORDER — INSULIN NPH (HUMAN) (ISOPHANE) 100 UNIT/ML ~~LOC~~ SUSP
40.0000 [IU] | Freq: Every day | SUBCUTANEOUS | Status: DC
  Filled 2017-07-17: qty 10

## 2017-07-17 MED ORDER — CYANOCOBALAMIN 1000 MCG/ML IJ SOLN
1000.0000 ug | Freq: Every day | INTRAMUSCULAR | Status: AC
  Administered 2017-07-17 – 2017-07-18 (×2): 1000 ug via SUBCUTANEOUS
  Filled 2017-07-17 (×2): qty 1

## 2017-07-17 MED ORDER — INSULIN NPH (HUMAN) (ISOPHANE) 100 UNIT/ML ~~LOC~~ SUSP
30.0000 [IU] | Freq: Every day | SUBCUTANEOUS | Status: DC
  Administered 2017-07-17 – 2017-07-19 (×3): 30 [IU] via SUBCUTANEOUS
  Filled 2017-07-17: qty 10

## 2017-07-17 NOTE — Care Management Note (Signed)
Case Management Note  Patient Details  Name: Sativa Gelles MRN: [id number] Date of Birth: [birth year]  Subjective/Objective:   DKA                Action/Plan: Patient lives at home with spouse; PCP: Alycia Rossetti, MD; has private insurance with [employer] with prescription drug coverage; pharmacy of choice is [facility]; DME - walker at home; [facility] choice offered, pt chose [place]; Santiago Glad with [place] called for arrangements;  Expected Discharge Date:  07/16/17               Expected Discharge Plan:  [place]  Discharge planning Services  CM Consult  Choice offered to:  Patient  HH Arranged:  RN, PT [facility] Agency:  [place]  Status of Service:  In process, will continue to follow  Sherrilyn Rist [phone] 07/17/2017, 3:53 PM

## 2017-07-17 NOTE — Progress Notes (Signed)
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 Patient was placed on antibiotics for oral infection 2 weeks ago which patient completed last week. Patient took 1 whole week of antibiotic. Does not recall the name. After the antibiotic course was overall patient was having persistent diarrhea with nausea vomiting. Diarrhea was clear with no blood. Patient was unable to eat anything because of fear of diarrhea. Denies any abdominal pain chest pain or shortness of breath. Patient states he has been taking a long-acting insulin but cut short on her sliding scale because she was not eating anything. Due to persistent weakness nausea vomiting and diarrhea patient went to her primary care physician who checked her blood sugar and was found to be markedly elevated and was referred to the ER.     Subjective: Has pain in her left knee and also feels too weak to even stand up on her own.  ROS: no complaints of nausea, vomiting, constipation diarrhea, cough, dyspnea or dysuria. No other complaints.     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 Patient was placed on antibiotics for oral infection 2 weeks ago which patient completed last week. Patient took 1 whole week of antibiotic. Does not recall the name. After the antibiotic  course was overall patient was having persistent diarrhea with nausea vomiting. Diarrhea was clear with no blood. Patient was unable to eat anything because of fear of diarrhea. Denies any abdominal pain chest pain or shortness of breath. Patient states he has been taking a long-acting insulin but did not take her short acting insulin.  Due to persistent weakness nausea vomiting and diarrhea patient went to her primary care physician who checked her blood sugar which was found to be markedly elevated. She was referred to the ER.  Hospital Course:  DM2 , uncontrolled -   she states that she is reasonably controlled on her home doses of insulin however, while she was having diarrhea, she was not taking her regular insulin but was taking her NPH -   she has been transitioned from the insulin infusion to s/c insulin and her sugars remain well controlled - of note, her A1c is 11.4 signifying poor control despite what the patient claims-  per notes from her PCP, the patient is poorly compliant with her diet  - sugars in the hospital are stable on current insulin doses  Diarrhea - her GI symptoms have resolved and is tolerating solid food without discomfort  Difficult ambulating/ left knee swelling and pain - imaging shows severe osteoarthritis- at this time, it is painful for her to even bend her knee- have asked for an opinion from ortho - PT eval pending  UTI/ fever -start Ceftin- U culture showing gr neg rods- follow- will need a 7 day course of antibiotics  Dehydration - has been given > 3 L of NS  to help correct this- no signs of acute heart failure  Iron  deficiency anemia -cont oral iron  B12 deficiency - replace B12 s/c  Chronic grade 1 dCHF -  Compensated- hold Demadex and Metolazone  HTN - cont Coreg &  imdur  CKD 4 - strable  DVT prophylaxis: Heparin Code Status: DNR Family Communication:  Disposition Plan: home vs SNF Consultants:   ortho Procedures:     Antimicrobials:  Anti-infectives (From admission, onward)   Start     Dose/Rate Route Frequency Ordered Stop   07/17/17 1200  cefUROXime (CEFTIN) tablet 500 mg     500 mg Oral 2 times daily with meals 07/17/17 1120         Objective: Vitals:   07/16/17 2100 07/17/17 0456 07/17/17 0812 07/17/17 1322  BP: (!) 148/71 139/60 129/76 (!) 119/57  Pulse: 96 87 86 84  Resp: 18 18  20   Temp: 98.8 F (37.1 C) (!) 100.7 F (38.2 C)  97.8 F (36.6 C)  TempSrc: Oral Oral  Oral  SpO2: 92% 92%  98%  Weight:      Height:       No intake or output data in the 24 hours ending 07/17/17 1433 Filed Weights   07/16/17 1900  Weight: 131.6 kg (290 lb 2 oz)    Examination: General exam: Appears comfortable  HEENT: PERRLA, oral mucosa moist, no sclera icterus or thrush Respiratory system: Clear to auscultation. Respiratory effort normal. Cardiovascular system: S1 & S2 heard, RRR.  No murmurs  Gastrointestinal system: Abdomen soft, non-tender, nondistended. Normal bowel sound. No organomegaly Central nervous system: Alert and oriented. No focal neurological deficits. Extremities: No cyanosis, clubbing or edema- left knee is tender and swollen, unable to bend her knee or lift left leg off of the bed Skin: No rashes or ulcers Psychiatry:  Mood & affect appropriate.     Data Reviewed: I have personally reviewed following labs and imaging studies  CBC: Recent Labs  Lab 07/15/17 1557 07/15/17 2050 07/16/17 0349  WBC 4.0  --  4.7  HGB 10.7* 9.2* 10.1*  HCT 35.3* 27.0* 30.7*  MCV 86.7  --  77.1*  PLT 170  --  177   Basic Metabolic Panel: Recent Labs  Lab 07/15/17 1557 07/15/17 2050 07/16/17 0056 07/16/17 0349 07/16/17 0933 07/17/17 0811  NA 120* 127* 135 135 137 136  K 5.1 4.8 3.8 3.6 3.7 3.8  CL 83* 93* 98* 98* 103 104  CO2 23  --  26 26 23  21*  GLUCOSE 1,079* >700* 237* 146* 139* 201*  BUN 57* 59* 52* 51* 47* 41*  CREATININE 2.98* 2.20* 2.53* 2.46* 2.18* 2.22*  CALCIUM  8.6*  --  8.9 8.7* 8.7* 8.6*   GFR: Estimated Creatinine Clearance: 33.2 mL/min (A) (by C-G formula based on SCr of 2.22 mg/dL (H)). Liver Function Tests: Recent Labs  Lab 07/15/17 1557  AST 17  ALT 11*  ALKPHOS 146*  BILITOT 0.8  PROT 7.1  ALBUMIN 3.0*   No results for input(s): LIPASE, AMYLASE in the last 168 hours. No results for input(s): AMMONIA in the last 168 hours. Coagulation Profile: No results for input(s): INR, PROTIME in the last 168 hours. Cardiac Enzymes: No results for input(s): CKTOTAL, CKMB, CKMBINDEX, TROPONINI in the last 168 hours. BNP (last 3 results) No results for input(s): PROBNP in the last 8760 hours. HbA1C: Recent Labs    07/16/17 0349  HGBA1C 11.4*   CBG: Recent Labs  Lab 07/16/17 2222 07/16/17 2355 07/17/17 0046 07/17/17 0757 07/17/17 1253  GLUCAP 117* 140* 142* 187* 190*  Lipid Profile: No results for input(s): CHOL, HDL, LDLCALC, TRIG, CHOLHDL, LDLDIRECT in the last 72 hours. Thyroid Function Tests: No results for input(s): TSH, T4TOTAL, FREET4, T3FREE, THYROIDAB in the last 72 hours. Anemia Panel: Recent Labs    07/17/17 0811  VITAMINB12 196  FOLATE 17.1  FERRITIN 171  TIBC 214*  IRON 11*  RETICCTPCT 1.9   Urine analysis:    Component Value Date/Time   COLORURINE YELLOW 07/15/2017 2009   APPEARANCEUR CLEAR 07/15/2017 2009   LABSPEC 1.012 07/15/2017 2009   PHURINE 5.0 07/15/2017 2009   GLUCOSEU >=500 (A) 07/15/2017 2009   HGBUR MODERATE (A) 07/15/2017 2009   BILIRUBINUR NEGATIVE 07/15/2017 2009   [place] NEGATIVE 07/15/2017 2009   PROTEINUR 30 (A) 07/15/2017 2009   UROBILINOGEN 0.2 04/01/2014 1659   NITRITE NEGATIVE 07/15/2017 2009   LEUKOCYTESUR NEGATIVE 07/15/2017 2009   Sepsis Labs: @LABRCNTIP (procalcitonin:4,lacticidven:4) ) Recent Results (from the past 240 hour(s))  Urine Culture     Status: Abnormal (Preliminary result)   Collection Time: 07/15/17  7:45 PM  Result Value Ref Range Status   Specimen  Description URINE, CLEAN CATCH  Final   Special Requests   Final    NONE Performed at [facility], [place] [address] [place], [place] [address]    Culture >=100,000 COLONIES/mL KLEBSIELLA PNEUMONIAE (A)  Final   Report Status PENDING  Incomplete         Radiology Studies: Dg Chest Port 1 View  Result Date: 07/16/2017 CLINICAL DATA:  Hyperglycemia and dehydration EXAM: PORTABLE CHEST 1 VIEW COMPARISON:  07/28/2016 FINDINGS: Mild bronchitic changes at the bases. No focal opacity or effusion. Stable cardiomediastinal silhouette. No pneumothorax. IMPRESSION: No active disease.  Stable mild bronchitic changes Electronically Signed   By: Donavan Foil M.D.   On: 07/16/2017 00:23   Dg Shoulder Left  Result Date: 07/15/2017 CLINICAL DATA:  [place] on floor 5 days ago. Left shoulder pain. Initial encounter. EXAM: LEFT SHOULDER - 2+ VIEW COMPARISON:  None. FINDINGS: There is no evidence of fracture or dislocation. Mild acromioclavicular joint degenerative changes. No other bone lesions identified. Soft tissues are unremarkable. IMPRESSION: No acute findings. Mild acromioclavicular DJD. Electronically Signed   By: Earle Gell M.D.   On: 07/15/2017 19:49   Dg Knee Complete 4 Views Left  Result Date: 07/17/2017 CLINICAL DATA:  Entire knee pain.  Unable to walk EXAM: LEFT KNEE - COMPLETE 4+ VIEW COMPARISON:  None. FINDINGS: Tricompartmental osteoarthritis with advanced tibiofemoral joint narrowing and subchondral sclerosis. There is generalized degenerative spurring. There is lateral translation of the tibial plateau. No evidence of bone lesion or fracture deformity. 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 Electronically Signed   By: Monte Fantasia M.D.   On: 07/17/2017 12:30      Scheduled Meds: . aspirin EC  81 mg Oral Daily  . carvedilol  12.5 mg Oral BID WC  . cefUROXime  500 mg Oral BID WC  . clonazePAM  1 mg Oral TID  . clopidogrel  75  mg Oral Daily  . ferrous sulfate  325 mg Oral Q breakfast  . heparin  5,000 Units Subcutaneous Q8H  . insulin aspart  0-15 Units Subcutaneous TID WC  . insulin aspart  0-5 Units Subcutaneous QHS  . insulin NPH Human  70 Units Subcutaneous QAC breakfast  . isosorbide dinitrate  10 mg Oral BID  . levothyroxine  50 mcg Oral QAC breakfast  . PARoxetine  62.5 mg Oral Daily  . pregabalin  150 mg Oral BID  . rosuvastatin  20 mg Oral Daily   Continuous Infusions: . sodium chloride 100 mL/hr at 07/16/17 1608     LOS: 1 day    Time spent in minutes: 35    Debbe Odea, MD [facility] Pager: [email or web address] Password [facility] 07/17/2017, 2:33 PM

## 2017-07-18 DIAGNOSIS — G8929 Other chronic pain: Secondary | ICD-10-CM

## 2017-07-18 LAB — GLUCOSE, CAPILLARY
Glucose-Capillary: 245 mg/dL — ABNORMAL HIGH (ref 65–99)
Glucose-Capillary: 269 mg/dL — ABNORMAL HIGH (ref 65–99)
Glucose-Capillary: 279 mg/dL — ABNORMAL HIGH (ref 65–99)
Glucose-Capillary: 302 mg/dL — ABNORMAL HIGH (ref 65–99)

## 2017-07-18 LAB — BASIC METABOLIC PANEL
Anion gap: 12 (ref 5–15)
BUN: 49 mg/dL — ABNORMAL HIGH (ref 6–20)
CO2: 21 mmol/L — ABNORMAL LOW (ref 22–32)
Calcium: 8.4 mg/dL — ABNORMAL LOW (ref 8.9–10.3)
Chloride: 100 mmol/L — ABNORMAL LOW (ref 101–111)
Creatinine, Ser: 2.6 mg/dL — ABNORMAL HIGH (ref 0.44–1.00)
GFR calc Af Amer: 21 mL/min — ABNORMAL LOW (ref >60)
GFR calc non Af Amer: 18 mL/min — ABNORMAL LOW (ref >60)
Glucose, Bld: 258 mg/dL — ABNORMAL HIGH (ref 65–99)
Potassium: 3.5 mmol/L (ref 3.5–5.1)
Sodium: 133 mmol/L — ABNORMAL LOW (ref 135–145)

## 2017-07-18 LAB — URINE CULTURE: Culture: >=100000 — AB

## 2017-07-18 MED ORDER — SODIUM CHLORIDE 0.9 % IV BOLUS (SEPSIS)
500.0000 mL | Freq: Once | INTRAVENOUS | Status: AC
  Administered 2017-07-18: 500 mL via INTRAVENOUS

## 2017-07-18 MED ORDER — CEFUROXIME AXETIL 500 MG PO TABS: 500.0000 mg | ORAL_TABLET | Freq: Two times a day (BID) | ORAL | 0 refills | Status: DC

## 2017-07-18 NOTE — Consult Note (Signed)
Reason for Consult: Left knee pain Referring Physician:  Debbe Odea, MD  Susan [name] is an 67 y.o. female.  HPI: The patient is a morbidly obese diabetic female who is unfortunately a hospital due to her blood glucose running high.  She does report that she has been having worsening knee pain of her left knee since last week and has seen one of my partners before for well known and documented tricompartmental arthritis of her left knee.  Orthopedics is consulted while she is here at [facility] due to the severity of her knee pain and her inability to put weight and ambulate on that knee.  The hope is may be placing a steroid injection in her knee that could help decrease her pain they can then help increase her mobility so the she could potentially be discharged sooner from the hospital.  The patient does report significant knee pain and does not want to put weight on that left knee.  Apparently x-rays did not show any acute findings in the knee other than severe end-stage arthritis of the left knee.  Past Medical History:  Diagnosis Date  . Acute MI ([place]) 1999; 2007  . Anemia    hx  . Anginal pain ([place])   . Anxiety   . Arthritis    "generalized" (03/15/2014)  . CAD (coronary artery disease)    MI in 2000 - MI  2007 - treated bare metal stent (no nuclear since then as 9/11)  . Carotid artery disease ([place])   . CHF (congestive heart failure) ([place])   . Chronic diastolic heart failure ([facility])    a) ECHO (08/2013) EF 55-60% and RV function nl b) RHC (08/2013) RA 4, RV 30/5/7, PA 25/10 (16), PCWP 7, Fick CO/CI 6.3/2.7, PVR 1.5 WU, PA 61 and 66%  . Daily headache    "~ every other day; since I fell in June" (03/15/2014)  . Depression   . Dyslipidemia   . Exertional shortness of breath   . HTN (hypertension)   . Hypothyroidism   . Obesity   . Osteoarthritis   . Peripheral neuropathy   . PONV (postoperative nausea and vomiting)   . RBBB (right bundle branch block)    Old  . Renal  insufficiency    DENIES  . Stroke [facility])    mini strokes  . Syncope    likely due to low blood sugar  . Tachycardia    Sinus tachycardia  . Type II diabetes mellitus ([place])   . Urinary incontinence   . Venous insufficiency     Past Surgical History:  Procedure Laterality Date  . ABDOMINAL HYSTERECTOMY  1980's  . CATARACT EXTRACTION, BILATERAL Bilateral ?2013  . CORONARY ANGIOPLASTY WITH STENT PLACEMENT  1999; 2007   "1 + 1"  . KNEE ARTHROSCOPY Left 10/25/2006  . RIGHT HEART CATHETERIZATION N/A 09/22/2013   Procedure: RIGHT HEART CATH;  Surgeon: Jolaine Artist, MD;  Location: [facility] CATH LAB;  Service: Cardiovascular;  Laterality: N/A;  . SHOULDER ARTHROSCOPY W/ ROTATOR CUFF REPAIR Right 03/14/2014  . SHOULDER ARTHROSCOPY WITH OPEN ROTATOR CUFF REPAIR Right 03/14/2014   Procedure: RIGHT SHOULDER ARTHROSCOPY WITH BICEPS RELEASE, OPEN SUBSCAPULA REPAIR, OPEN SUPRASPINATUS REPAIR.;  Surgeon: Meredith Pel, MD;  Location: [facility];  Service: Orthopedics;  Laterality: Right;  . TUBAL LIGATION  1970's    Family History  Problem Relation Age of Onset  . Heart attack Mother   . Coronary artery disease Unknown     Social History:  reports that she quit smoking about 19 years ago. Her smoking use included cigarettes. She has a 96.00 pack-year smoking history. she has never used smokeless tobacco. She reports that she drinks alcohol. She reports that she does not use drugs.  Allergies:  Allergies  Allergen Reactions  . Codeine Nausea And Vomiting    Medications: I have reviewed the patient's current medications.  Results for orders placed or performed during the hospital encounter of 07/15/17 (from the past 48 hour(s))  Glucose, capillary     Status: Abnormal   Collection Time: 07/16/17  8:56 PM  Result Value Ref Range   Glucose-Capillary 110 (H) 65 - 99 mg/dL  Glucose, capillary     Status: Abnormal   Collection Time: 07/16/17 10:22 PM  Result Value Ref Range    Glucose-Capillary 117 (H) 65 - 99 mg/dL  Glucose, capillary     Status: Abnormal   Collection Time: 07/16/17 11:55 PM  Result Value Ref Range   Glucose-Capillary 140 (H) 65 - 99 mg/dL  Glucose, capillary     Status: Abnormal   Collection Time: 07/17/17 12:46 AM  Result Value Ref Range   Glucose-Capillary 142 (H) 65 - 99 mg/dL  Glucose, capillary     Status: Abnormal   Collection Time: 07/17/17  7:57 AM  Result Value Ref Range   Glucose-Capillary 187 (H) 65 - 99 mg/dL  Basic metabolic panel     Status: Abnormal   Collection Time: 07/17/17  8:11 AM  Result Value Ref Range   Sodium 136 135 - 145 mmol/L   Potassium 3.8 3.5 - 5.1 mmol/L   Chloride 104 101 - 111 mmol/L   CO2 21 (L) 22 - 32 mmol/L   Glucose, Bld 201 (H) 65 - 99 mg/dL   BUN 41 (H) 6 - 20 mg/dL   Creatinine, Ser 2.22 (H) 0.44 - 1.00 mg/dL   Calcium 8.6 (L) 8.9 - 10.3 mg/dL   GFR calc non Af Amer 22 (L) >60 mL/min   GFR calc Af Amer 25 (L) >60 mL/min    Comment: (NOTE) The eGFR has been calculated using the CKD EPI equation. This calculation has not been validated in all clinical situations. eGFR's persistently <60 mL/min signify possible Chronic Kidney Disease.    Anion gap 11 5 - 15    Comment: Performed at [facility] [address] [place], [place] [address]  Vitamin B12     Status: None   Collection Time: 07/17/17  8:11 AM  Result Value Ref Range   Vitamin B-12 196 180 - 914 pg/mL    Comment: (NOTE) This assay is not validated for testing neonatal or myeloproliferative syndrome specimens for Vitamin B12 levels. Performed at [facility], [place] [address], [place], [name] [address]   Folate     Status: None   Collection Time: 07/17/17  8:11 AM  Result Value Ref Range   Folate 17.1 >5.9 ng/mL    Comment: RESULTS CONFIRMED BY MANUAL DILUTION REPEATED TO VERIFY Performed at [facility], [place] [address], [place], [place] [address]   Iron and TIBC     Status: Abnormal   Collection Time:  07/17/17  8:11 AM  Result Value Ref Range   Iron 11 (L) 28 - 170 ug/dL   TIBC 214 (L) 250 - 450 ug/dL   Saturation Ratios 5 (L) 10.4 - 31.8 %   UIBC 203 ug/dL    Comment: Performed at [facility], [place]  [address], [place], [place] [address]  Ferritin     Status: None   Collection Time: 07/17/17  8:11 AM  Result Value Ref Range   Ferritin 171 11 - 307 ng/mL    Comment: Performed at [facility], [place] [address] [place], [place] [address]  Reticulocytes     Status: Abnormal   Collection Time: 07/17/17  8:11 AM  Result Value Ref Range   Retic Ct Pct 1.9 0.4 - 3.1 %   RBC. 3.80 (L) 3.87 - 5.11 MIL/uL   Retic Count, Absolute 72.2 19.0 - 186.0 K/uL    Comment: Performed at [facility] [address], [place], [place] [address]  Glucose, capillary     Status: Abnormal   Collection Time: 07/17/17 12:53 PM  Result Value Ref Range   Glucose-Capillary 190 (H) 65 - 99 mg/dL  Glucose, capillary     Status: Abnormal   Collection Time: 07/17/17  5:40 PM  Result Value Ref Range   Glucose-Capillary 478 (H) 65 - 99 mg/dL  Glucose, capillary     Status: Abnormal   Collection Time: 07/17/17  9:31 PM  Result Value Ref Range   Glucose-Capillary 399 (H) 65 - 99 mg/dL  Basic metabolic panel     Status: Abnormal   Collection Time: 07/18/17  4:01 AM  Result Value Ref Range   Sodium 133 (L) 135 - 145 mmol/L   Potassium 3.5 3.5 - 5.1 mmol/L   Chloride 100 (L) 101 - 111 mmol/L   CO2 21 (L) 22 - 32 mmol/L   Glucose, Bld 258 (H) 65 - 99 mg/dL   BUN 49 (H) 6 - 20 mg/dL   Creatinine, Ser 2.60 (H) 0.44 - 1.00 mg/dL   Calcium 8.4 (L) 8.9 - 10.3 mg/dL   GFR calc non Af Amer 18 (L) >60 mL/min   GFR calc Af Amer 21 (L) >60 mL/min    Comment: (NOTE) The eGFR has been calculated using the CKD EPI equation. This calculation has not been validated in all clinical situations. eGFR's persistently <60 mL/min signify possible Chronic Kidney Disease.    Anion gap 12 5 - 15    Comment:  Performed at [facility] [address], [place], [place] [address]  Glucose, capillary     Status: Abnormal   Collection Time: 07/18/17  7:43 AM  Result Value Ref Range   Glucose-Capillary 245 (H) 65 - 99 mg/dL  Glucose, capillary     Status: Abnormal   Collection Time: 07/18/17 12:42 PM  Result Value Ref Range   Glucose-Capillary 269 (H) 65 - 99 mg/dL    Dg Knee Complete 4 Views Left  Result Date: 07/17/2017 CLINICAL DATA:  Entire knee pain.  Unable to walk EXAM: LEFT KNEE - COMPLETE 4+ VIEW COMPARISON:  None. FINDINGS: Tricompartmental osteoarthritis with advanced tibiofemoral joint narrowing and subchondral sclerosis. There is generalized degenerative spurring. There is lateral translation of the tibial plateau. No evidence of bone lesion or fracture deformity. 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 Electronically Signed   By: Monte Fantasia M.D.   On: 07/17/2017 12:30   X-rays of the left knee are independently reviewed and does show severe tricompartmental arthritic changes around the left knee.  ROS Blood pressure (!) 129/49, pulse 87, temperature 98.1 F (36.7 C), temperature source Oral, resp. rate 18, height _0  (1.651 m), weight 290 lb 2 oz (131.6 kg), SpO2 94 %. Physical Exam  Constitutional: She is oriented to person,  place, and time. She appears well-developed and well-nourished.  Musculoskeletal:       Left knee: She exhibits decreased range of motion and bony tenderness. Tenderness found. Medial joint line and lateral joint line tenderness noted.  Neurological: She is alert and oriented to person, place, and time.  There is no knee joint effusion of her left knee.  She does have chronic venous stasis changes in the skin on both legs.  There are no open wounds.  Assessment/Plan: Acute on chronic left knee pain with known severe tricompartmental arthritis  I talked to the patient in length and decided to try  at least a steroid injection in her knee at the bedside.  She was agreeable to trying this.  I then cleaned the anterior lateral aspect of her left knee with Betadine and alcohol swab and then injected a mixture of 4 cc of plain lidocaine with 1 cc of 40 Depo-Medrol which she tolerated well.  I did talk to her and nursing about the possibility that this may increase her blood glucose having a steroid injection.  I am not sure what else to try other than a knee brace however the types of braces here in the hospital are more for immobilization and not for arthritis.  Due to her body habitus may not fit well either.  I certainly would recommend just for now a walker to help with her balance and mobility.  Unfortunately given the severity of her obesity and poorly controlled blood glucose she is not a candidate for knee replacement surgery at this time.  She can follow-up with Dr. Marlou Sa as an outpatient.  Mcarthur Rossetti 07/18/2017, 4:08 PM

## 2017-07-18 NOTE — Evaluation (Signed)
Physical Therapy Evaluation Patient Details Name: Susan [name] MRN: [id number] DOB: [birth year] Today's Date: 07/18/2017   History of Present Illness  Pt is a 67 y/o female admitted secondary to hyperglycemia with uncontrolled DM. PMH including but not limited to CHF, CAD status post bare-metal stent placement, hypothyroidism, chronic kidney disease and chronic anemia.    Clinical Impression  Pt presented supine in bed with HOB elevated, awake and willing to participate in therapy session. Prior to admission, pt reported that she ambulated with use of RW and was independent with ADLs. Pt's spouse present throughout session as well. Pt currently requires max A x2 for bed mobility with increased pain, especially in L knee. Therapist attempted to perform PROM and further investigate knee pain; however, pt refusing and reporting that it was "too painful". Pt will require post-acute rehab services. PT recommending pt d/c to SNF prior to returning home with family; however, pt currently refusing. Pt's husband stating during session that he would not be able to fully assist pt at home. Do not feel that pt is safe to d/c home at this time and requires ST rehab at a SNF. Pt would continue to benefit from skilled physical therapy services at this time while admitted and after d/c to address the below listed limitations in order to improve overall safety and independence with functional mobility.     Follow Up Recommendations SNF;Supervision/Assistance - 24 hour;Other (comment)(pt currently refusing)    Equipment Recommendations  Wheelchair (measurements PT);Wheelchair cushion (measurements PT);Other (comment)(Hoyer lift)    Recommendations for Other Services       Precautions / Restrictions Precautions Precautions: Fall Restrictions Weight Bearing Restrictions: No      Mobility  Bed Mobility Overal bed mobility: Needs Assistance Bed Mobility: Rolling Rolling: Max assist;+2 for physical  assistance         General bed mobility comments: pt required max A x2 at trunk, pelvis and bilateral LEs to roll bilaterally for pericare as pt had a BM in bed prior to PT's arrival. Pt very painful with all movement, especially with L LE movement; pt very fatigued after rolling and pericare  Transfers                 General transfer comment: unable at this time; will require mechanical lift  Ambulation/Gait             General Gait Details: unable  Stairs            Wheelchair Mobility    Modified Rankin (Stroke Patients Only)       Balance                                             Pertinent Vitals/Pain Pain Assessment: Faces Faces Pain Scale: Hurts whole lot Pain Location: L knee Pain Descriptors / Indicators: Sore;Grimacing;Guarding Pain Intervention(s): Monitored during session;Repositioned    Home Living Family/patient expects to be discharged to:: Private residence Living Arrangements: Spouse/significant other Available Help at Discharge: Family;Available PRN/intermittently Type of Home: House Home Access: Level entry     Home Layout: One level Home Equipment: Walker - 2 wheels;Walker - 4 wheels      Prior Function Level of Independence: Independent with assistive device(s)         Comments: ambulates with RW     Hand Dominance        Extremity/Trunk Assessment  Upper Extremity Assessment Upper Extremity Assessment: Generalized weakness    Lower Extremity Assessment Lower Extremity Assessment: Generalized weakness;LLE deficits/detail LLE Deficits / Details: pt with very limited L knee mobility secondary to pain; PROM knee flexion limited to ~30 degrees with pain LLE: Unable to fully assess due to pain       Communication   Communication: No difficulties  Cognition Arousal/Alertness: Awake/alert Behavior During Therapy: WFL for tasks assessed/performed Overall Cognitive Status:  Impaired/Different from baseline Area of Impairment: Safety/judgement;Problem solving;Following commands                       Following Commands: Follows one step commands consistently Safety/Judgement: Decreased awareness of safety;Decreased awareness of deficits   Problem Solving: Difficulty sequencing;Requires verbal cues;Requires tactile cues        General Comments      Exercises     Assessment/Plan    PT Assessment Patient needs continued PT services  PT Problem List Decreased strength;Decreased activity tolerance;Decreased range of motion;Decreased balance;Decreased mobility;Decreased coordination;Decreased cognition;Decreased knowledge of use of DME;Decreased safety awareness;Decreased knowledge of precautions;Pain       PT Treatment Interventions DME instruction;Gait training;Functional mobility training;Therapeutic activities;Therapeutic exercise;Balance training;Neuromuscular re-education;Cognitive remediation;Patient/family education    PT Goals (Current goals can be found in the Care Plan section)  Acute Rehab PT Goals Patient Stated Goal: return home PT Goal Formulation: With patient/family Time For Goal Achievement: 08/01/17 Potential to Achieve Goals: Fair    Frequency Min 3X/week   Barriers to discharge        Co-evaluation               AM-PAC PT "6 Clicks" Daily Activity  Outcome Measure Difficulty turning over in bed (including adjusting bedclothes, sheets and blankets)?: Unable Difficulty moving from lying on back to sitting on the side of the bed? : Unable Difficulty sitting down on and standing up from a chair with arms (e.g., wheelchair, bedside commode, etc,.)?: Unable Help needed moving to and from a bed to chair (including a wheelchair)?: Total Help needed walking in hospital room?: Total Help needed climbing 3-5 steps with a railing? : Total 6 Click Score: 6    End of Session   Activity Tolerance: Patient limited by  fatigue;Patient limited by pain Patient left: in bed;with call bell/phone within reach;with bed alarm set;with family/visitor present Nurse Communication: Mobility status;Need for lift equipment;Other (comment)(NT - pt needed new Purewick) PT Visit Diagnosis: Other abnormalities of gait and mobility (R26.89);Muscle weakness (generalized) (M62.81);Pain Pain - Right/Left: Left Pain - part of body: Knee    Time: 1203-1235 PT Time Calculation (min) (ACUTE ONLY): 32 min   Charges:   PT Evaluation $PT Eval Moderate Complexity: 1 Mod PT Treatments $Therapeutic Activity: 8-22 mins   PT G Codes:        [place], PT, DPT [place] 07/18/2017, 12:57 PM

## 2017-07-18 NOTE — Progress Notes (Signed)
PROGRESS NOTE    Susan [name]   WER:[id number]  DOB: 1950/12/14  DOA: 07/15/2017 PCP: Susan Rossetti, MD   Brief Narrative:  Susan [name] is a 67 y.o.femalewithhistory of chronic diastolic CHF, CAD status post bare-metal stent placement, hypothyroidism, chronic kidney disease, chronic anemia probably fromrenal disease was referred to the ER by patient's primary care physician after patient's blood sugar was found to be unrecordable. Patient was placed on antibiotics for oral infection 2 weeks ago which patient completed last week. Patient took 1 whole week of antibiotic. Does not recall the name. After the antibiotic course was overall patient was having persistent diarrhea with nausea vomiting. Diarrhea was clear with no blood. Patient was unable to eat anything because of fear of diarrhea. Denies any abdominal pain chest pain or shortness of breath. Patient states he has been taking a long-acting insulin but cut short on her sliding scale because she was not eating anything. Due to persistent weakness nausea vomiting and diarrhea patient went to her primary care physician who checked her blood sugar and was found to be markedly elevated and was referred to the ER.     Subjective: Wants to go home. Again reminded today that if she does not get out of bed, I cannot send her home. ROS: no complaints of nausea, vomiting, constipation diarrhea, cough, dyspnea or dysuria. No other complaints.     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 Patient was placed on antibiotics for oral infection 2 weeks ago which patient completed last week. Patient took 1 whole week of antibiotic. Does not recall the name.  After the antibiotic course was overall patient was having persistent diarrhea with nausea vomiting. Diarrhea was clear with no blood. Patient was unable to eat anything because of fear of diarrhea. Denies any abdominal pain chest pain or shortness of breath. Patient states he has been taking a long-acting insulin but did not take her short acting insulin.  Due to persistent weakness nausea vomiting and diarrhea patient went to her primary care physician who checked her blood sugar which was found to be markedly elevated. She was referred to the ER.  Hospital Course:  DM2 , uncontrolled -   she states that she is reasonably controlled on her home doses of insulin however, while she was having diarrhea, she was not taking her regular insulin but was taking her NPH -   she has been transitioned from the insulin infusion to s/c insulin and her sugars remain well controlled - of note, her A1c is 11.4 signifying poor control despite what the patient claims-  per notes from her PCP, the patient is poorly compliant with her diet  - sugars in the hospital are stable on current insulin doses  Diarrhea - her GI symptoms have resolved and is tolerating solid food without discomfort  Difficult ambulating/ left knee swelling and pain - imaging shows severe osteoarthritis- at this time, it is painful for her to even bend her knee- have asked for an opinion from ortho - PT eval done but the patient declined to get out of bed- have spoken with ortho again today to evaluate her - she refuses to go to SNF and will likely go home with home health  UTI/ fever -start Ceftin- U culture showing gr  neg rods- follow- will need a 7 day course of antibiotics  Dehydration - has been given > 3 L of NS  to help correct this- no signs of acute heart failure  Iron deficiency anemia -cont oral iron  B12 deficiency - replace B12 s/c  Chronic grade 1 dCHF -  Compensated- hold Demadex and Metolazone  HTN -  cont Coreg &  imdur  CKD 4 - strable  DVT prophylaxis: Heparin Code Status: DNR Family Communication:  Disposition Plan: home vs SNF Consultants:   ortho Procedures:    Antimicrobials:  Anti-infectives (From admission, onward)   Start     Dose/Rate Route Frequency Ordered Stop   07/18/17 0000  cefUROXime (CEFTIN) 500 MG tablet     500 mg Oral 2 times daily with meals 07/18/17 1454     07/17/17 1200  cefUROXime (CEFTIN) tablet 500 mg     500 mg Oral 2 times daily with meals 07/17/17 1120         Objective: Vitals:   07/18/17 0300 07/18/17 0626 07/18/17 0820 07/18/17 1248  BP:  (!) 126/57 (!) 128/41 (!) 129/49  Pulse:  82 83 87  Resp:  18 16 18   Temp:  98.5 F (36.9 C) 98.3 F (36.8 C) 98.1 F (36.7 C)  TempSrc:  Oral Oral Oral  SpO2:  92% 93% 94%  Weight: 131.6 kg (290 lb 2 oz)     Height:        Intake/Output Summary (Last 24 hours) at 07/18/2017 1505 Last data filed at 07/18/2017 0944 Gross per 24 hour  Intake 3667.07 ml  Output 850 ml  Net 2817.07 ml   Filed Weights   07/16/17 1900 07/18/17 0300  Weight: 131.6 kg (290 lb 2 oz) 131.6 kg (290 lb 2 oz)    Examination: General exam: Appears comfortable  HEENT: PERRLA, oral mucosa moist, no sclera icterus or thrush Respiratory system: Clear to auscultation. Respiratory effort normal. Cardiovascular system: S1 & S2 heard, RRR.  No murmurs  Gastrointestinal system: Abdomen soft, non-tender, nondistended. Normal bowel sound. No organomegaly Central nervous system: Alert and oriented. No focal neurological deficits. Extremities: No cyanosis, clubbing or edema- left knee is tender and swollen, unable to bend her knee or lift left leg off of the bed Skin: No rashes or ulcers Psychiatry:  Mood & affect appropriate.     Data Reviewed: I have personally reviewed following labs and imaging studies  CBC: Recent Labs  Lab 07/15/17 1557 07/15/17 2050 07/16/17 0349  WBC 4.0  --  4.7  HGB 10.7* 9.2* 10.1*    HCT 35.3* 27.0* 30.7*  MCV 86.7  --  77.1*  PLT 170  --  235   Basic Metabolic Panel: Recent Labs  Lab 07/16/17 0056 07/16/17 0349 07/16/17 0933 07/17/17 0811 07/18/17 0401  NA 135 135 137 136 133*  K 3.8 3.6 3.7 3.8 3.5  CL 98* 98* 103 104 100*  CO2 26 26 23  21* 21*  GLUCOSE 237* 146* 139* 201* 258*  BUN 52* 51* 47* 41* 49*  CREATININE 2.53* 2.46* 2.18* 2.22* 2.60*  CALCIUM 8.9 8.7* 8.7* 8.6* 8.4*   GFR: Estimated Creatinine Clearance: 28.4 mL/min (A) (by C-G formula based on SCr of 2.6 mg/dL (H)). Liver Function Tests: Recent Labs  Lab 07/15/17 1557  AST 17  ALT 11*  ALKPHOS 146*  BILITOT 0.8  PROT 7.1  ALBUMIN 3.0*   No results for input(s): LIPASE, AMYLASE in the last 168 hours. No results for input(s): AMMONIA  in the last 168 hours. Coagulation Profile: No results for input(s): INR, PROTIME in the last 168 hours. Cardiac Enzymes: No results for input(s): CKTOTAL, CKMB, CKMBINDEX, TROPONINI in the last 168 hours. BNP (last 3 results) No results for input(s): PROBNP in the last 8760 hours. HbA1C: Recent Labs    07/16/17 0349  HGBA1C 11.4*   CBG: Recent Labs  Lab 07/17/17 1253 07/17/17 1740 07/17/17 2131 07/18/17 0743 07/18/17 1242  GLUCAP 190* 478* 399* 245* 269*   Lipid Profile: No results for input(s): CHOL, HDL, LDLCALC, TRIG, CHOLHDL, LDLDIRECT in the last 72 hours. Thyroid Function Tests: No results for input(s): TSH, T4TOTAL, FREET4, T3FREE, THYROIDAB in the last 72 hours. Anemia Panel: Recent Labs    07/17/17 0811  VITAMINB12 196  FOLATE 17.1  FERRITIN 171  TIBC 214*  IRON 11*  RETICCTPCT 1.9   Urine analysis:    Component Value Date/Time   COLORURINE YELLOW 07/15/2017 2009   APPEARANCEUR CLEAR 07/15/2017 2009   LABSPEC 1.012 07/15/2017 2009   PHURINE 5.0 07/15/2017 2009   GLUCOSEU >=500 (A) 07/15/2017 2009   HGBUR MODERATE (A) 07/15/2017 2009   BILIRUBINUR NEGATIVE 07/15/2017 2009   [place] NEGATIVE 07/15/2017 2009    PROTEINUR 30 (A) 07/15/2017 2009   UROBILINOGEN 0.2 04/01/2014 1659   NITRITE NEGATIVE 07/15/2017 2009   LEUKOCYTESUR NEGATIVE 07/15/2017 2009   Sepsis Labs: @LABRCNTIP (procalcitonin:4,lacticidven:4) ) Recent Results (from the past 240 hour(s))  Urine Culture     Status: Abnormal   Collection Time: 07/15/17  7:45 PM  Result Value Ref Range Status   Specimen Description URINE, CLEAN CATCH  Final   Special Requests   Final    NONE Performed at [facility], [place] [address], [place], [place] [address]    Culture >=100,000 COLONIES/mL KLEBSIELLA PNEUMONIAE (A)  Final   Report Status 07/18/2017 FINAL  Final   Organism ID, Bacteria KLEBSIELLA PNEUMONIAE (A)  Final      Susceptibility   Klebsiella pneumoniae - MIC*    AMPICILLIN >=32 RESISTANT Resistant     CEFAZOLIN <=4 SENSITIVE Sensitive     CEFTRIAXONE <=1 SENSITIVE Sensitive     CIPROFLOXACIN <=0.25 SENSITIVE Sensitive     GENTAMICIN <=1 SENSITIVE Sensitive     IMIPENEM <=0.25 SENSITIVE Sensitive     NITROFURANTOIN 64 INTERMEDIATE Intermediate     TRIMETH/SULFA <=20 SENSITIVE Sensitive     AMPICILLIN/SULBACTAM 4 SENSITIVE Sensitive     PIP/TAZO <=4 SENSITIVE Sensitive     Extended ESBL NEGATIVE Sensitive     * >=100,000 COLONIES/mL KLEBSIELLA PNEUMONIAE         Radiology Studies: Dg Knee Complete 4 Views Left  Result Date: 07/17/2017 CLINICAL DATA:  Entire knee pain.  Unable to walk EXAM: LEFT KNEE - COMPLETE 4+ VIEW COMPARISON:  None. FINDINGS: Tricompartmental osteoarthritis with advanced tibiofemoral joint narrowing and subchondral sclerosis. There is generalized degenerative spurring. There is lateral translation of the tibial plateau. No evidence of bone lesion or fracture deformity. 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 Electronically Signed   By: Monte Fantasia M.D.   On: 07/17/2017 12:30      Scheduled Meds: . aspirin EC  81 mg Oral Daily  . carvedilol   12.5 mg Oral BID WC  . cefUROXime  500 mg Oral BID WC  . clonazePAM  1 mg Oral TID  . clopidogrel  75 mg Oral Daily  . ferrous sulfate  325 mg Oral Q breakfast  . heparin  5,000 Units Subcutaneous Q8H  .  insulin aspart  0-15 Units Subcutaneous TID WC  . insulin aspart  0-5 Units Subcutaneous QHS  . insulin NPH Human  30 Units Subcutaneous QAC supper  . insulin NPH Human  70 Units Subcutaneous QAC breakfast  . isosorbide dinitrate  10 mg Oral BID  . levothyroxine  50 mcg Oral QAC breakfast  . PARoxetine  62.5 mg Oral Daily  . pregabalin  150 mg Oral BID  . rosuvastatin  20 mg Oral Daily   Continuous Infusions:    LOS: 3 days    Time spent in minutes: 35    Debbe Odea, MD [facility] Pager: [email or web address] Password [facility] 07/18/2017, 3:05 PM

## 2017-07-19 DIAGNOSIS — E1165 Type 2 diabetes mellitus with hyperglycemia: Secondary | ICD-10-CM

## 2017-07-19 DIAGNOSIS — R262 Difficulty in walking, not elsewhere classified: Secondary | ICD-10-CM

## 2017-07-19 LAB — GLUCOSE, CAPILLARY
Glucose-Capillary: 378 mg/dL — ABNORMAL HIGH (ref 65–99)
Glucose-Capillary: 357 mg/dL — ABNORMAL HIGH (ref 65–99)
Glucose-Capillary: 315 mg/dL — ABNORMAL HIGH (ref 65–99)
Glucose-Capillary: 315 mg/dL — ABNORMAL HIGH (ref 65–99)

## 2017-07-19 MED ORDER — INSULIN NPH (HUMAN) (ISOPHANE) 100 UNIT/ML ~~LOC~~ SUSP
20.0000 [IU] | Freq: Once | SUBCUTANEOUS | Status: AC
  Administered 2017-07-19: 20 [IU] via SUBCUTANEOUS
  Filled 2017-07-19: qty 10

## 2017-07-19 MED ORDER — INSULIN NPH (HUMAN) (ISOPHANE) 100 UNIT/ML ~~LOC~~ SUSP
90.0000 [IU] | Freq: Every day | SUBCUTANEOUS | Status: DC
  Filled 2017-07-19: qty 10

## 2017-07-19 MED ORDER — INSULIN ASPART 100 UNIT/ML ~~LOC~~ SOLN
3.0000 [IU] | Freq: Once | SUBCUTANEOUS | Status: AC
  Administered 2017-07-19: 3 [IU] via SUBCUTANEOUS

## 2017-07-19 MED ORDER — INSULIN ASPART 100 UNIT/ML ~~LOC~~ SOLN
22.0000 [IU] | Freq: Once | SUBCUTANEOUS | Status: AC
  Administered 2017-07-19: 22 [IU] via SUBCUTANEOUS

## 2017-07-19 MED ORDER — INSULIN ASPART 100 UNIT/ML ~~LOC~~ SOLN
15.0000 [IU] | Freq: Once | SUBCUTANEOUS | Status: AC
  Administered 2017-07-19: 15 [IU] via SUBCUTANEOUS

## 2017-07-19 MED ORDER — INSULIN NPH (HUMAN) (ISOPHANE) 100 UNIT/ML ~~LOC~~ SUSP: 90.0000 [IU] | Freq: Every day | SUBCUTANEOUS | 11 refills | Status: DC

## 2017-07-19 MED ORDER — INSULIN ASPART 100 UNIT/ML ~~LOC~~ SOLN: 20.0000 [IU] | Freq: Once | SUBCUTANEOUS | Status: DC

## 2017-07-19 MED ORDER — INSULIN NPH (HUMAN) (ISOPHANE) 100 UNIT/ML ~~LOC~~ SUSP: 40.0000 [IU] | Freq: Every day | SUBCUTANEOUS | 11 refills | Status: DC

## 2017-07-19 NOTE — Progress Notes (Signed)
Susan [name] to be D/C'd Home per MD order.  Discussed with the patient and all questions fully answered.  VSS, Skin clean, dry and intact without evidence of skin break down, no evidence of skin tears noted. IV catheter discontinued intact. Site without signs and symptoms of complications. Dressing and pressure applied.  An After Visit Summary was printed and given to the patient. Patient received prescription.  D/c education completed with patient/family including follow up instructions, medication list, d/c activities limitations if indicated, with other d/c instructions as indicated by MD - patient able to verbalize understanding, all questions fully answered.   Patient instructed to return to ED, call 911, or call MD for any changes in condition.   Patient escorted via [place], and D/C home via private auto.  Celine Ahr 07/19/2017 7:18 PM

## 2017-07-19 NOTE — Discharge Summary (Signed)
Physician Discharge Summary  [name] VOZ:[id number] DOB: 28-May-1950 DOA: 07/15/2017  PCP: Alycia Rossetti, MD  Admit date: 07/15/2017 Discharge date: 07/19/2017  Admitted From: home Disposition:  home   Recommendations for Outpatient Follow-up:  1. High risk for readmission as she cannot ambulate and refuses SNF 2. Very poor diabetes control with high risk for complications  Home Health:  [facility] Equipment/Devices:  wheelchair    Discharge Condition:  stable   CODE STATUS:  Full code   Consultations:  ortho    Discharge Diagnoses:  Principal Problem:   Hyperosmolar non-ketotic state in patient with type 2 diabetes mellitus ([place]) Active Problems:   Nausea vomiting and diarrhea   Impaired ambulation   UTI   Essential hypertension   CAD (coronary artery disease)   Morbid obesity ([place])   Chronic diastolic CHF (congestive heart failure) ([place])   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 mellitus ([place])    Subjective: No complaints. Asking to go home again today.   Brief Summary: Susan [name] is a 67 y.o.femalewithhistory of chronic diastolic CHF, CAD status post bare-metal stent placement, hypothyroidism, chronic kidney disease, chronic anemia probably fromrenal disease was referred to the ER by patient's primary care physician after patient's blood sugar was found to be unrecordable. Patient was placed on antibiotics for oral infection 2 weeks ago which patient completed last week. Patient took 1 whole week of antibiotic. Does not recall the name. After the antibiotic course was overall patient was having persistent diarrhea with nausea vomiting. Diarrhea was clear with no blood. Patient was unable to eat anything because of fear of diarrhea. Denies any abdominal pain chest pain or shortness of breath. Patient states he has been taking a long-acting insulin but cut short on her sliding scale because she was not eating anything. Due to persistent weakness nausea vomiting  and diarrhea patient went to her primary care physician who checked her blood sugar and was found to be markedly elevated and was referred to the ER.    Hospital Course:  DM2 , uncontrolled - she states that she is reasonably controlled on her home doses of insulin however, while she was having diarrhea, she was not taking her regular insulin but was taking her NPH -  she has been transitioned from the insulin infusion to s/c insulin and her sugars remain well controlled - of note, her A1c is 11.4 signifying poor control despite what the patient claims-  per notes from her PCP, the patient is poorly compliant with her diet  - sugars in the hospital were stable initially, however once she received the steroid injection into her left knee, her sugars have risen significantly and therefore I have had to increase her doses of Insuiln  Diarrhea - her GI symptoms have resolved and is tolerating solid food without discomfort  Difficult ambulating/ left knee swelling and pain due to severe osteoarthritis - imaging shows severe osteoarthritis- at this time, it is painful for her to even bend her knee- ortho eval requested - Dr Ninfa Linden injection a steroid into the left knee to help improve mobility- he notes that she is not a candidate for a replacement due to morbid obesity and uncontrolled DM - PT eval ordered but for a few days, the patient declined to get out of bed-  She eventually was assisted out of bed but can not stand on her own and is a 2 person assist - she refuses to go to SNF - I had a discussion with  the patient and her husband in regards to how difficult it will be manage at home if she cannot ambulate- she continues to say she wants to go home and therefore I will be discharging her today. Will order home health  UTI/ fever  - U culture showing K pneumoniae sensitive to cephalosporins- follow- will need a 7 day course of antibiotics  Dehydration - has been given > 3 L of NS  to  help correct this- no signs of acute heart failure  Iron deficiency anemia -cont oral iron  B12 deficiency - replace B12 s/c  Chronic grade 1 dCHF -  Compensated- holding Demadex and Metolazone in the hospital  HTN - cont Coreg &  imdur  CKD 4 - stable  Morbid obesity Body mass index is 48.28 kg/m.       Discharge Exam: Vitals:   07/19/17 0807 07/19/17 1247  BP: 138/62 (!) 119/36  Pulse: 81 67  Resp: 20 18  Temp: (!) 97.1 F (36.2 C) 97.6 F (36.4 C)  SpO2: 90% 95%   Vitals:   07/19/17 0519 07/19/17 0556 07/19/17 0807 07/19/17 1247  BP: (!) 125/57  138/62 (!) 119/36  Pulse: 79 71 81 67  Resp: 16  20 18   Temp: 97.8 F (36.6 C)  (!) 97.1 F (36.2 C) 97.6 F (36.4 C)  TempSrc: Oral  Oral   SpO2: (!) 88% 92% 90% 95%  Weight:      Height:        General: Pt is alert, awake, not in acute distress Cardiovascular: RRR, S1/S2 +, no rubs, no gallops Respiratory: CTA bilaterally, no wheezing, no rhonchi Abdominal: Soft, NT, ND, bowel sounds + Extremities: no edema, no cyanosis   Discharge Instructions  Discharge Instructions    Diet - low sodium heart healthy   Complete by:  As directed    Diet - low sodium heart healthy   Complete by:  As directed    Diet - low sodium heart healthy   Complete by:  As directed    Diet Carb Modified   Complete by:  As directed    Diet Carb Modified   Complete by:  As directed    Diet Carb Modified   Complete by:  As directed    Increase activity slowly   Complete by:  As directed    Increase activity slowly   Complete by:  As directed    Increase activity slowly   Complete by:  As directed      Allergies as of 07/19/2017      Reactions   Codeine Nausea And Vomiting      Medication List    TAKE these medications   albuterol 108 (90 Base) MCG/ACT inhaler Commonly known as:  PROVENTIL HFA;VENTOLIN HFA Inhale 2 puffs into the lungs every 6 (six) hours as needed for wheezing or shortness of breath.  Reported on 10/30/2015   aspirin EC 81 MG tablet Take 81 mg by mouth daily.   carvedilol 12.5 MG tablet Commonly known as:  COREG Take 1 tablet (12.5 mg total) by mouth 2 (two) times daily with a meal.   cefUROXime 500 MG tablet Commonly known as:  CEFTIN Take 1 tablet (500 mg total) by mouth 2 (two) times daily with a meal.   clonazePAM 1 MG tablet Commonly known as:  KLONOPIN TAKE 1 TABLET BY MOUTH THREE TIMES A DAY What changed:    how much to take  how to take this  when to take  this   clopidogrel 75 MG tablet Commonly known as:  PLAVIX TAKE 1 TABLET (75 MG TOTAL) BY MOUTH DAILY WITH BREAKFAST.   ferrous sulfate 325 (65 FE) MG tablet Take 325 mg by mouth daily with breakfast. Reported on 08/21/2015   insulin NPH Human 100 UNIT/ML injection Commonly known as:  HUMULIN N,NOVOLIN N Inject 0.9 mLs (90 Units total) into the skin daily before breakfast. Inject 75 units in the morning and 6 units at bedtime What changed:    how much to take  how to take this  when to take this   insulin NPH Human 100 UNIT/ML injection Commonly known as:  HUMULIN N,NOVOLIN N Inject 0.4 mLs (40 Units total) into the skin daily before supper. What changed:  You were already taking a medication with the same name, and this prescription was added. Make sure you understand how and when to take each.   insulin regular 100 units/mL injection Commonly known as:  NOVOLIN R,HUMULIN R Inject 5-40 Units into the skin 3 (three) times daily before meals. Sliding scale   isosorbide dinitrate 10 MG tablet Commonly known as:  ISORDIL Take 1 tablet (10 mg total) by mouth 2 (two) times daily.   levothyroxine 50 MCG tablet Commonly known as:  SYNTHROID, LEVOTHROID Take 1 tablet (50 mcg total) by mouth daily before breakfast.   losartan 50 MG tablet Commonly known as:  COZAAR TAKE 1 TABLET BY MOUTH EVERY DAY What changed:    how much to take  how to take this  when to take this   Magnesium  Oxide 400 (240 Mg) MG Tabs TAKE 1 TABLET BY MOUTH EVERY DAY What changed:    how much to take  how to take this  when to take this   metolazone 2.5 MG tablet Commonly known as:  ZAROXOLYN Take 1 tablet (2.5 mg total) by mouth once a week.   metolazone 2.5 MG tablet Commonly known as:  ZAROXOLYN Take 1 tablet (2.5 mg total) by mouth once a week.   nitroGLYCERIN 0.4 MG SL tablet Commonly known as:  NITROSTAT PLACE 1 TABLET (0.4 MG TOTAL) UNDER THE TONGUE EVERY 5 (FIVE) MINUTES AS NEEDED FOR CHEST PAIN.   PARoxetine 37.5 MG 24 hr tablet Commonly known as:  PAXIL-CR Take 37.5 mg by mouth daily. Take with 25 mg tablet daily (total 62.5 mg)   PARoxetine 25 MG 24 hr tablet Commonly known as:  PAXIL-CR Take 25 mg by mouth daily. Take with 37.5 mg tablet daily (total 62.5 mg)   PARoxetine 37.5 MG 24 hr tablet Commonly known as:  PAXIL-CR TAKE 1 TABLET BY MOUTH EVERY DAY   potassium chloride 10 MEQ tablet Commonly known as:  K-DUR Take 1 tablet (10 mEq total) by mouth daily. Take with the water pill in the morning   pregabalin 150 MG capsule Commonly known as:  LYRICA Take 1 capsule (150 mg total) by mouth 2 (two) times daily.   rosuvastatin 20 MG tablet Commonly known as:  CRESTOR Take 1 tablet (20 mg total) by mouth daily.   torsemide 20 MG tablet Commonly known as:  DEMADEX Take 5 tablets (100 mg total) by mouth 2 (two) times daily.      Follow-up Information    [facility]   Specialty:  [place] Why:  They will do your home health care at your home Contact information: [address] [place] [address] [phone]  Allergies  Allergen Reactions  . Codeine Nausea And Vomiting     Procedures/Studies:  left knee steroid injection  Dg Chest Port 1 View  Result Date: 07/16/2017 CLINICAL DATA:  Hyperglycemia and dehydration EXAM: PORTABLE CHEST 1 VIEW COMPARISON:  07/28/2016 FINDINGS: Mild  bronchitic changes at the bases. No focal opacity or effusion. Stable cardiomediastinal silhouette. No pneumothorax. IMPRESSION: No active disease.  Stable mild bronchitic changes Electronically Signed   By: Donavan Foil M.D.   On: 07/16/2017 00:23   Dg Shoulder Left  Result Date: 07/15/2017 CLINICAL DATA:  [place] on floor 5 days ago. Left shoulder pain. Initial encounter. EXAM: LEFT SHOULDER - 2+ VIEW COMPARISON:  None. FINDINGS: There is no evidence of fracture or dislocation. Mild acromioclavicular joint degenerative changes. No other bone lesions identified. Soft tissues are unremarkable. IMPRESSION: No acute findings. Mild acromioclavicular DJD. Electronically Signed   By: Earle Gell M.D.   On: 07/15/2017 19:49   Dg Knee Complete 4 Views Left  Result Date: 07/17/2017 CLINICAL DATA:  Entire knee pain.  Unable to walk EXAM: LEFT KNEE - COMPLETE 4+ VIEW COMPARISON:  None. FINDINGS: Tricompartmental osteoarthritis with advanced tibiofemoral joint narrowing and subchondral sclerosis. There is generalized degenerative spurring. There is lateral translation of the tibial plateau. No evidence of bone lesion or fracture deformity. 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 Electronically Signed   By: Monte Fantasia M.D.   On: 07/17/2017 12:30     The results of significant diagnostics from this hospitalization (including imaging, microbiology, ancillary and laboratory) are listed below for reference.     Microbiology: Recent Results (from the past 240 hour(s))  Urine Culture     Status: Abnormal   Collection Time: 07/15/17  7:45 PM  Result Value Ref Range Status   Specimen Description URINE, CLEAN CATCH  Final   Special Requests   Final    NONE Performed at [facility], [address], [place], [place] [address]    Culture >=100,000 COLONIES/mL KLEBSIELLA PNEUMONIAE (A)  Final   Report Status 07/18/2017 FINAL  Final   Organism ID,  Bacteria KLEBSIELLA PNEUMONIAE (A)  Final      Susceptibility   Klebsiella pneumoniae - MIC*    AMPICILLIN >=32 RESISTANT Resistant     CEFAZOLIN <=4 SENSITIVE Sensitive     CEFTRIAXONE <=1 SENSITIVE Sensitive     CIPROFLOXACIN <=0.25 SENSITIVE Sensitive     GENTAMICIN <=1 SENSITIVE Sensitive     IMIPENEM <=0.25 SENSITIVE Sensitive     NITROFURANTOIN 64 INTERMEDIATE Intermediate     TRIMETH/SULFA <=20 SENSITIVE Sensitive     AMPICILLIN/SULBACTAM 4 SENSITIVE Sensitive     PIP/TAZO <=4 SENSITIVE Sensitive     Extended ESBL NEGATIVE Sensitive     * >=100,000 COLONIES/mL KLEBSIELLA PNEUMONIAE     Labs: BNP (last 3 results) Recent Labs    01/13/17 1456 02/02/17 1530 04/29/17 1109  BNP 139.5* 131.8* 88.4   Basic Metabolic Panel: Recent Labs  Lab 07/16/17 0056 07/16/17 0349 07/16/17 0933 07/17/17 0811 07/18/17 0401  NA 135 135 137 136 133*  K 3.8 3.6 3.7 3.8 3.5  CL 98* 98* 103 104 100*  CO2 26 26 23  21* 21*  GLUCOSE 237* 146* 139* 201* 258*  BUN 52* 51* 47* 41* 49*  CREATININE 2.53* 2.46* 2.18* 2.22* 2.60*  CALCIUM 8.9 8.7* 8.7* 8.6* 8.4*   Liver Function Tests: Recent Labs  Lab 07/15/17 1557  AST 17  ALT 11*  ALKPHOS 146*  BILITOT 0.8  PROT 7.1  ALBUMIN 3.0*   No results for input(s): LIPASE, AMYLASE in the last 168 hours. No results for input(s): AMMONIA in the last 168 hours. CBC: Recent Labs  Lab 07/15/17 1557 07/15/17 2050 07/16/17 0349  WBC 4.0  --  4.7  HGB 10.7* 9.2* 10.1*  HCT 35.3* 27.0* 30.7*  MCV 86.7  --  77.1*  PLT 170  --  173   Cardiac Enzymes: No results for input(s): CKTOTAL, CKMB, CKMBINDEX, TROPONINI in the last 168 hours. BNP: Invalid input(s): POCBNP CBG: Recent Labs  Lab 07/18/17 1642 07/18/17 2212 07/19/17 0750 07/19/17 1326 07/19/17 1513  GLUCAP 279* 302* 378* 357* 315*   D-Dimer No results for input(s): DDIMER in the last 72 hours. Hgb A1c No results for input(s): HGBA1C in the last 72 hours. Lipid Profile No  results for input(s): CHOL, HDL, LDLCALC, TRIG, CHOLHDL, LDLDIRECT in the last 72 hours. Thyroid function studies No results for input(s): TSH, T4TOTAL, T3FREE, THYROIDAB in the last 72 hours.  Invalid input(s): FREET3 Anemia work up Recent Labs    07/17/17 0811  VITAMINB12 196  FOLATE 17.1  FERRITIN 171  TIBC 214*  IRON 11*  RETICCTPCT 1.9   Urinalysis    Component Value Date/Time   COLORURINE YELLOW 07/15/2017 2009   APPEARANCEUR CLEAR 07/15/2017 2009   LABSPEC 1.012 07/15/2017 2009   PHURINE 5.0 07/15/2017 2009   GLUCOSEU >=500 (A) 07/15/2017 2009   HGBUR MODERATE (A) 07/15/2017 2009   BILIRUBINUR NEGATIVE 07/15/2017 2009   [place] NEGATIVE 07/15/2017 2009   PROTEINUR 30 (A) 07/15/2017 2009   UROBILINOGEN 0.2 04/01/2014 1659   NITRITE NEGATIVE 07/15/2017 2009   LEUKOCYTESUR NEGATIVE 07/15/2017 2009   Sepsis Labs Invalid input(s): PROCALCITONIN,  WBC,  LACTICIDVEN Microbiology Recent Results (from the past 240 hour(s))  Urine Culture     Status: Abnormal   Collection Time: 07/15/17  7:45 PM  Result Value Ref Range Status   Specimen Description URINE, CLEAN CATCH  Final   Special Requests   Final    NONE Performed at [facility], [place] [address], [place], [place] [address]    Culture >=100,000 COLONIES/mL KLEBSIELLA PNEUMONIAE (A)  Final   Report Status 07/18/2017 FINAL  Final   Organism ID, Bacteria KLEBSIELLA PNEUMONIAE (A)  Final      Susceptibility   Klebsiella pneumoniae - MIC*    AMPICILLIN >=32 RESISTANT Resistant     CEFAZOLIN <=4 SENSITIVE Sensitive     CEFTRIAXONE <=1 SENSITIVE Sensitive     CIPROFLOXACIN <=0.25 SENSITIVE Sensitive     GENTAMICIN <=1 SENSITIVE Sensitive     IMIPENEM <=0.25 SENSITIVE Sensitive     NITROFURANTOIN 64 INTERMEDIATE Intermediate     TRIMETH/SULFA <=20 SENSITIVE Sensitive     AMPICILLIN/SULBACTAM 4 SENSITIVE Sensitive     PIP/TAZO <=4 SENSITIVE Sensitive     Extended ESBL NEGATIVE Sensitive     * >=100,000  COLONIES/mL KLEBSIELLA PNEUMONIAE     Time coordinating discharge: Over 30 minutes  SIGNED:   Debbe Odea, MD  [facility] 07/19/2017, 4:13 PM Pager   If 7PM-7AM, please contact night-coverage [email or web address] Password [email or web address]

## 2017-07-20 ENCOUNTER — Ambulatory Visit (HOSPITAL_COMMUNITY): Admission: RE | Admit: 2017-07-20 | Payer: PPO | Source: Ambulatory Visit

## 2017-07-20 ENCOUNTER — Encounter (HOSPITAL_COMMUNITY): Payer: PPO

## 2017-07-20 ENCOUNTER — Other Ambulatory Visit: Payer: Self-pay | Admitting: Family Medicine

## 2017-07-20 ENCOUNTER — Telehealth: Payer: Self-pay | Admitting: Family Medicine

## 2017-07-20 LAB — GLUCOSE, CAPILLARY: Glucose-Capillary: 407 mg/dL — ABNORMAL HIGH (ref 65–99)

## 2017-07-20 NOTE — Telephone Encounter (Signed)
Call placed to patient.   States that she had F/U with Endo scheduled for 07/21/2017, but cancelled appointment due to lack of transportation.   Advised that she come to our office and patient declined. Stressed importance of F/U especially with FSBS so high, but patient adamant she is not coming in.   MD to be made aware.

## 2017-07-20 NOTE — Consult Note (Signed)
[facility] CM Primary Care Navigator  07/20/2017  [name] 08/13/1950 [id number]   Attempt to seepatient at the bedside to identify possible discharge needsbutshe was already discharged per staff report. Patient was discharged home yesterday.  Per MD note, patient had persistentweakness, nausea, vomiting and diarrhea, went to her primary care physician who checked her blood sugar and was found to be markedly elevated. Patient was then referred to ER by primary care provider.  Primary care provider's office is listed as providing transition of care (TOC).  Primary care provider's office called(spoke with Janae Sauce notify of patient's discharge andneed for post hospital follow-up and transition of care (TOC). Notified of patient's several health issues needing follow-up.  Made aware to refer patient to [facility] CM if deemed necessary and appropriate for services.   For additional questions please contact:  Karin Golden A. Delan Ksiazek, BSN, RN-BC [facility] PRIMARY CARE Navigator Cell: [phone]

## 2017-07-20 NOTE — Telephone Encounter (Signed)
Loreane from [name] care management  called states patient has several red flags from her recent hospital follow up 11.4 A1C while in the hospital and her sugar was so high nausea, vomiting and diarrhea. She refused skilled nursing when discharged from hospital. Please reach out to patient for transition of care.  CB# [phone]

## 2017-07-21 ENCOUNTER — Telehealth: Payer: Self-pay | Admitting: *Deleted

## 2017-07-21 DIAGNOSIS — N184 Chronic kidney disease, stage 4 (severe): Secondary | ICD-10-CM | POA: Diagnosis not present

## 2017-07-21 DIAGNOSIS — Z7951 Long term (current) use of inhaled steroids: Secondary | ICD-10-CM | POA: Diagnosis not present

## 2017-07-21 DIAGNOSIS — R32 Unspecified urinary incontinence: Secondary | ICD-10-CM | POA: Diagnosis not present

## 2017-07-21 DIAGNOSIS — I2510 Atherosclerotic heart disease of native coronary artery without angina pectoris: Secondary | ICD-10-CM | POA: Diagnosis not present

## 2017-07-21 DIAGNOSIS — E039 Hypothyroidism, unspecified: Secondary | ICD-10-CM | POA: Diagnosis not present

## 2017-07-21 DIAGNOSIS — I5032 Chronic diastolic (congestive) heart failure: Secondary | ICD-10-CM | POA: Diagnosis not present

## 2017-07-21 DIAGNOSIS — I130 Hypertensive heart and chronic kidney disease with heart failure and stage 1 through stage 4 chronic kidney disease, or unspecified chronic kidney disease: Secondary | ICD-10-CM | POA: Diagnosis not present

## 2017-07-21 DIAGNOSIS — R296 Repeated falls: Secondary | ICD-10-CM | POA: Diagnosis not present

## 2017-07-21 DIAGNOSIS — E1140 Type 2 diabetes mellitus with diabetic neuropathy, unspecified: Secondary | ICD-10-CM | POA: Diagnosis not present

## 2017-07-21 DIAGNOSIS — E669 Obesity, unspecified: Secondary | ICD-10-CM | POA: Diagnosis not present

## 2017-07-21 DIAGNOSIS — Z7902 Long term (current) use of antithrombotics/antiplatelets: Secondary | ICD-10-CM | POA: Diagnosis not present

## 2017-07-21 DIAGNOSIS — E1100 Type 2 diabetes mellitus with hyperosmolarity without nonketotic hyperglycemic-hyperosmolar coma (NKHHC): Secondary | ICD-10-CM | POA: Diagnosis not present

## 2017-07-21 DIAGNOSIS — D638 Anemia in other chronic diseases classified elsewhere: Secondary | ICD-10-CM | POA: Diagnosis not present

## 2017-07-21 DIAGNOSIS — F419 Anxiety disorder, unspecified: Secondary | ICD-10-CM | POA: Diagnosis not present

## 2017-07-21 DIAGNOSIS — M1991 Primary osteoarthritis, unspecified site: Secondary | ICD-10-CM | POA: Diagnosis not present

## 2017-07-21 DIAGNOSIS — E1122 Type 2 diabetes mellitus with diabetic chronic kidney disease: Secondary | ICD-10-CM | POA: Diagnosis not present

## 2017-07-21 DIAGNOSIS — Z794 Long term (current) use of insulin: Secondary | ICD-10-CM | POA: Diagnosis not present

## 2017-07-21 NOTE — Telephone Encounter (Signed)
Noted above Pt refusing the care that she needs

## 2017-07-21 NOTE — Telephone Encounter (Signed)
noted 

## 2017-07-21 NOTE — Telephone Encounter (Signed)
Received call from [name] SN with [place] [phone] telephone.   Requested VO to extend [facility] SN 2x weekly x3 weeks, then 1x weekly x6 weeks for CHF, DM, fall prevention and education. VO given.  Also reports that patient reported x2 falls on 07/20/2017. States that EMS was called in to assist patient from floor. Patient refused to go to ER for evaluation. No signs of acute injury noted. Patient does report some generalized soreness.   Patient is also voicing c/o burning with urination. Patient refusing to come to office for OV. HH SN requested order for UA C&S. VO given. Will fax results to office.   Of note, HH SN did encourage patient to keep her appointments and to schedule hospital F/U with PCP for continuity of care. Patient reports that she is too weak to leave home.   Requested clarification on insulin orders. Advised to contact Dr. [name], endocrinology for clarification. Advised that patient had appointment with endo on 07/21/2017 that patient cancelled, so they may require OV before clarifying orders from hospital. Also reports that patient cancelled her ECHO scheduled for 07/20/2017.  MD to be made aware.

## 2017-07-22 ENCOUNTER — Other Ambulatory Visit: Payer: Self-pay

## 2017-07-22 ENCOUNTER — Telehealth: Payer: Self-pay | Admitting: *Deleted

## 2017-07-22 ENCOUNTER — Inpatient Hospital Stay (HOSPITAL_COMMUNITY)
Admission: EM | Admit: 2017-07-22 | Discharge: 2017-07-28 | DRG: 682 | Disposition: A | Discharge status: Skilled Nursing Facility | Payer: PPO | Attending: Internal Medicine | Admitting: Internal Medicine

## 2017-07-22 ENCOUNTER — Encounter (HOSPITAL_COMMUNITY): Payer: Self-pay | Admitting: Emergency Medicine

## 2017-07-22 DIAGNOSIS — S199XXA Unspecified injury of neck, initial encounter: Secondary | ICD-10-CM | POA: Diagnosis not present

## 2017-07-22 DIAGNOSIS — D649 Anemia, unspecified: Secondary | ICD-10-CM | POA: Diagnosis present

## 2017-07-22 DIAGNOSIS — E1142 Type 2 diabetes mellitus with diabetic polyneuropathy: Secondary | ICD-10-CM | POA: Diagnosis present

## 2017-07-22 DIAGNOSIS — B961 Klebsiella pneumoniae [K. pneumoniae] as the cause of diseases classified elsewhere: Secondary | ICD-10-CM | POA: Diagnosis not present

## 2017-07-22 DIAGNOSIS — F329 Major depressive disorder, single episode, unspecified: Secondary | ICD-10-CM | POA: Diagnosis present

## 2017-07-22 DIAGNOSIS — E66813 Obesity, class 3: Secondary | ICD-10-CM | POA: Diagnosis present

## 2017-07-22 DIAGNOSIS — I5082 Biventricular heart failure: Secondary | ICD-10-CM | POA: Diagnosis present

## 2017-07-22 DIAGNOSIS — I130 Hypertensive heart and chronic kidney disease with heart failure and stage 1 through stage 4 chronic kidney disease, or unspecified chronic kidney disease: Secondary | ICD-10-CM | POA: Diagnosis not present

## 2017-07-22 DIAGNOSIS — E1165 Type 2 diabetes mellitus with hyperglycemia: Secondary | ICD-10-CM | POA: Diagnosis present

## 2017-07-22 DIAGNOSIS — E43 Unspecified severe protein-calorie malnutrition: Secondary | ICD-10-CM | POA: Diagnosis present

## 2017-07-22 DIAGNOSIS — R7989 Other specified abnormal findings of blood chemistry: Secondary | ICD-10-CM | POA: Diagnosis present

## 2017-07-22 DIAGNOSIS — I5032 Chronic diastolic (congestive) heart failure: Secondary | ICD-10-CM | POA: Diagnosis not present

## 2017-07-22 DIAGNOSIS — N184 Chronic kidney disease, stage 4 (severe): Secondary | ICD-10-CM | POA: Diagnosis present

## 2017-07-22 DIAGNOSIS — R748 Abnormal levels of other serum enzymes: Secondary | ICD-10-CM | POA: Diagnosis not present

## 2017-07-22 DIAGNOSIS — R531 Weakness: Secondary | ICD-10-CM

## 2017-07-22 DIAGNOSIS — R278 Other lack of coordination: Secondary | ICD-10-CM | POA: Diagnosis not present

## 2017-07-22 DIAGNOSIS — I2721 Secondary pulmonary arterial hypertension: Secondary | ICD-10-CM | POA: Diagnosis not present

## 2017-07-22 DIAGNOSIS — I5033 Acute on chronic diastolic (congestive) heart failure: Secondary | ICD-10-CM | POA: Diagnosis not present

## 2017-07-22 DIAGNOSIS — F419 Anxiety disorder, unspecified: Secondary | ICD-10-CM | POA: Diagnosis present

## 2017-07-22 DIAGNOSIS — E039 Hypothyroidism, unspecified: Secondary | ICD-10-CM | POA: Diagnosis not present

## 2017-07-22 DIAGNOSIS — R262 Difficulty in walking, not elsewhere classified: Secondary | ICD-10-CM | POA: Diagnosis not present

## 2017-07-22 DIAGNOSIS — Z8673 Personal history of transient ischemic attack (TIA), and cerebral infarction without residual deficits: Secondary | ICD-10-CM | POA: Diagnosis not present

## 2017-07-22 DIAGNOSIS — E1122 Type 2 diabetes mellitus with diabetic chronic kidney disease: Secondary | ICD-10-CM | POA: Diagnosis present

## 2017-07-22 DIAGNOSIS — I252 Old myocardial infarction: Secondary | ICD-10-CM | POA: Diagnosis not present

## 2017-07-22 DIAGNOSIS — Z9841 Cataract extraction status, right eye: Secondary | ICD-10-CM

## 2017-07-22 DIAGNOSIS — E785 Hyperlipidemia, unspecified: Secondary | ICD-10-CM | POA: Diagnosis present

## 2017-07-22 DIAGNOSIS — D638 Anemia in other chronic diseases classified elsewhere: Secondary | ICD-10-CM | POA: Diagnosis not present

## 2017-07-22 DIAGNOSIS — R778 Other specified abnormalities of plasma proteins: Secondary | ICD-10-CM | POA: Diagnosis present

## 2017-07-22 DIAGNOSIS — G9341 Metabolic encephalopathy: Secondary | ICD-10-CM | POA: Diagnosis present

## 2017-07-22 DIAGNOSIS — B9620 Unspecified Escherichia coli [E. coli] as the cause of diseases classified elsewhere: Secondary | ICD-10-CM

## 2017-07-22 DIAGNOSIS — R4182 Altered mental status, unspecified: Secondary | ICD-10-CM | POA: Diagnosis not present

## 2017-07-22 DIAGNOSIS — R600 Localized edema: Secondary | ICD-10-CM | POA: Diagnosis not present

## 2017-07-22 DIAGNOSIS — Z79899 Other long term (current) drug therapy: Secondary | ICD-10-CM

## 2017-07-22 DIAGNOSIS — I2510 Atherosclerotic heart disease of native coronary artery without angina pectoris: Secondary | ICD-10-CM | POA: Diagnosis not present

## 2017-07-22 DIAGNOSIS — N170 Acute kidney failure with tubular necrosis: Secondary | ICD-10-CM | POA: Diagnosis not present

## 2017-07-22 DIAGNOSIS — E662 Morbid (severe) obesity with alveolar hypoventilation: Secondary | ICD-10-CM | POA: Diagnosis not present

## 2017-07-22 DIAGNOSIS — Z7982 Long term (current) use of aspirin: Secondary | ICD-10-CM

## 2017-07-22 DIAGNOSIS — E669 Obesity, unspecified: Secondary | ICD-10-CM | POA: Diagnosis present

## 2017-07-22 DIAGNOSIS — Z794 Long term (current) use of insulin: Secondary | ICD-10-CM

## 2017-07-22 DIAGNOSIS — Z90710 Acquired absence of both cervix and uterus: Secondary | ICD-10-CM

## 2017-07-22 DIAGNOSIS — R296 Repeated falls: Secondary | ICD-10-CM | POA: Diagnosis present

## 2017-07-22 DIAGNOSIS — Z9842 Cataract extraction status, left eye: Secondary | ICD-10-CM

## 2017-07-22 DIAGNOSIS — R488 Other symbolic dysfunctions: Secondary | ICD-10-CM | POA: Diagnosis not present

## 2017-07-22 DIAGNOSIS — Z9111 Patient's noncompliance with dietary regimen: Secondary | ICD-10-CM

## 2017-07-22 DIAGNOSIS — N189 Chronic kidney disease, unspecified: Secondary | ICD-10-CM | POA: Diagnosis present

## 2017-07-22 DIAGNOSIS — R404 Transient alteration of awareness: Secondary | ICD-10-CM | POA: Diagnosis not present

## 2017-07-22 DIAGNOSIS — I10 Essential (primary) hypertension: Secondary | ICD-10-CM | POA: Diagnosis not present

## 2017-07-22 DIAGNOSIS — R1310 Dysphagia, unspecified: Secondary | ICD-10-CM | POA: Diagnosis present

## 2017-07-22 DIAGNOSIS — Z6841 Body Mass Index (BMI) 40.0 and over, adult: Secondary | ICD-10-CM | POA: Diagnosis not present

## 2017-07-22 DIAGNOSIS — Z87891 Personal history of nicotine dependence: Secondary | ICD-10-CM

## 2017-07-22 DIAGNOSIS — Z955 Presence of coronary angioplasty implant and graft: Secondary | ICD-10-CM

## 2017-07-22 DIAGNOSIS — N179 Acute kidney failure, unspecified: Secondary | ICD-10-CM

## 2017-07-22 DIAGNOSIS — B9689 Other specified bacterial agents as the cause of diseases classified elsewhere: Secondary | ICD-10-CM

## 2017-07-22 DIAGNOSIS — Z7902 Long term (current) use of antithrombotics/antiplatelets: Secondary | ICD-10-CM

## 2017-07-22 DIAGNOSIS — N390 Urinary tract infection, site not specified: Secondary | ICD-10-CM

## 2017-07-22 DIAGNOSIS — Z9181 History of falling: Secondary | ICD-10-CM | POA: Diagnosis not present

## 2017-07-22 DIAGNOSIS — I50811 Acute right heart failure: Secondary | ICD-10-CM | POA: Diagnosis not present

## 2017-07-22 DIAGNOSIS — R2681 Unsteadiness on feet: Secondary | ICD-10-CM | POA: Diagnosis not present

## 2017-07-22 DIAGNOSIS — M542 Cervicalgia: Secondary | ICD-10-CM | POA: Diagnosis not present

## 2017-07-22 HISTORY — DX: Acute kidney failure, unspecified: N17.9

## 2017-07-22 LAB — I-STAT CHEM 8, ED
BUN: 122 mg/dL — ABNORMAL HIGH (ref 6–20)
Calcium, Ion: 0.99 mmol/L — ABNORMAL LOW (ref 1.15–1.40)
Chloride: 102 mmol/L (ref 101–111)
Creatinine, Ser: 4.8 mg/dL — ABNORMAL HIGH (ref 0.44–1.00)
Glucose, Bld: 103 mg/dL — ABNORMAL HIGH (ref 65–99)
HCT: 32 % — ABNORMAL LOW (ref 36.0–46.0)
Hemoglobin: 10.9 g/dL — ABNORMAL LOW (ref 12.0–15.0)
Potassium: 4.1 mmol/L (ref 3.5–5.1)
Sodium: 135 mmol/L (ref 135–145)
TCO2: 18 mmol/L — ABNORMAL LOW (ref 22–32)

## 2017-07-22 LAB — URINALYSIS, ROUTINE W REFLEX MICROSCOPIC
Bilirubin Urine: NEGATIVE
Glucose, UA: NEGATIVE mg/dL
Ketones, ur: NEGATIVE mg/dL
Nitrite: NEGATIVE
Protein, ur: 30 mg/dL — AB
Specific Gravity, Urine: 1.012 (ref 1.005–1.030)
pH: 5 (ref 5.0–8.0)

## 2017-07-22 LAB — I-STAT TROPONIN, ED: Troponin i, poc: 0.31 ng/mL (ref 0.00–0.08)

## 2017-07-22 LAB — I-STAT CG4 LACTIC ACID, ED: Lactic Acid, Venous: 0.95 mmol/L (ref 0.5–1.9)

## 2017-07-22 LAB — COMPREHENSIVE METABOLIC PANEL
ALT: 27 U/L (ref 14–54)
AST: 16 U/L (ref 15–41)
Albumin: 2.5 g/dL — ABNORMAL LOW (ref 3.5–5.0)
Alkaline Phosphatase: 148 U/L — ABNORMAL HIGH (ref 38–126)
Anion gap: 14 (ref 5–15)
BUN: 123 mg/dL — ABNORMAL HIGH (ref 6–20)
CO2: 17 mmol/L — ABNORMAL LOW (ref 22–32)
Calcium: 7.7 mg/dL — ABNORMAL LOW (ref 8.9–10.3)
Chloride: 102 mmol/L (ref 101–111)
Creatinine, Ser: 4.81 mg/dL — ABNORMAL HIGH (ref 0.44–1.00)
GFR calc Af Amer: 10 mL/min — ABNORMAL LOW (ref >60)
GFR calc non Af Amer: 8 mL/min — ABNORMAL LOW (ref >60)
Glucose, Bld: 109 mg/dL — ABNORMAL HIGH (ref 65–99)
Potassium: 4.1 mmol/L (ref 3.5–5.1)
Sodium: 133 mmol/L — ABNORMAL LOW (ref 135–145)
Total Bilirubin: 0.4 mg/dL (ref 0.3–1.2)
Total Protein: 6.1 g/dL — ABNORMAL LOW (ref 6.5–8.1)

## 2017-07-22 LAB — CK TOTAL AND CKMB (NOT AT ARMC)
CK, MB: 5.2 ng/mL — ABNORMAL HIGH (ref 0.5–5.0)
Relative Index: INVALID (ref 0.0–2.5)
Total CK: 84 U/L (ref 38–234)

## 2017-07-22 LAB — CBC WITH DIFFERENTIAL/PLATELET
Basophils Absolute: 0 10*3/uL (ref 0.0–0.1)
Basophils Relative: 0 %
Eosinophils Absolute: 0.1 10*3/uL (ref 0.0–0.7)
Eosinophils Relative: 1 %
HCT: 32 % — ABNORMAL LOW (ref 36.0–46.0)
Hemoglobin: 10.4 g/dL — ABNORMAL LOW (ref 12.0–15.0)
Lymphocytes Relative: 10 %
Lymphs Abs: 1.1 10*3/uL (ref 0.7–4.0)
MCH: 25.3 pg — ABNORMAL LOW (ref 26.0–34.0)
MCHC: 32.5 g/dL (ref 30.0–36.0)
MCV: 77.9 fL — ABNORMAL LOW (ref 78.0–100.0)
Monocytes Absolute: 0.3 10*3/uL (ref 0.1–1.0)
Monocytes Relative: 3 %
Neutro Abs: 9.4 10*3/uL — ABNORMAL HIGH (ref 1.7–7.7)
Neutrophils Relative %: 86 %
Platelets: 299 10*3/uL (ref 150–400)
RBC: 4.11 MIL/uL (ref 3.87–5.11)
RDW: 15.5 % (ref 11.5–15.5)
WBC: 10.9 10*3/uL — ABNORMAL HIGH (ref 4.0–10.5)

## 2017-07-22 LAB — CBG MONITORING, ED: Glucose-Capillary: 86 mg/dL (ref 65–99)

## 2017-07-22 LAB — TROPONIN I: Troponin I: 0.26 ng/mL (ref <0.03)

## 2017-07-22 MED ORDER — PRO-STAT SUGAR FREE PO LIQD
30.0000 mL | Freq: Two times a day (BID) | ORAL | Status: DC
  Administered 2017-07-22 – 2017-07-28 (×8): 30 mL via ORAL
  Filled 2017-07-22 (×13): qty 30

## 2017-07-22 MED ORDER — SODIUM CHLORIDE 0.9 % IV BOLUS (SEPSIS)
1000.0000 mL | Freq: Once | INTRAVENOUS | Status: AC
  Administered 2017-07-22: 1000 mL via INTRAVENOUS

## 2017-07-22 NOTE — ED Notes (Signed)
Meal given-MD aware

## 2017-07-22 NOTE — ED Triage Notes (Addendum)
PT arrived via [facility]. She is A&O X4, reports a headache. Patient discharged from [facility] on Sunday 02/24 and reports to have fallen everyday since discharge. EMS reports that patient called them twice today for falls. The first time, she refused care, the second time that she fell she was also found to be hypotensive(79/50). 475mls of Normal Saline was administered bringing her BP up to 106/70

## 2017-07-22 NOTE — ED Notes (Signed)
I-stat lactic acid result given to Dr. Jeneen Rinks

## 2017-07-22 NOTE — ED Provider Notes (Signed)
[facility] EMERGENCY DEPARTMENT Provider Note   CSN: [id number] Arrival date & time: 07/22/17  1633     History   Chief Complaint Chief Complaint  Patient presents with  . Fall    X 2    HPI Stephaniemarie Stoffel is a 67 y.o. female.  Chief complaint is fall, unable to walk, low blood pressure.  HPI 67 year old female.  Admitted here by myself 7 weeks ago to the internal medicine team.  Marked hyperglycemia requiring insulin drip at that time.  Was still weakened condition and unable to walk at discharge but refused sniff.  Discharge note describes probable high risk of readmission due to inability to walk or care for self at home.  This is come to fruition.  She has had improvement of her blood sugars, overall.  However, progressive weakness.  Fell this morning.  Paramedics came out helped her back in the bed.  [facility] again this afternoon.  Requested transfer.  No chest pain.  No shortness of breath.  No injury from her fall.  Profound weakness.  Cannot walk.  Past Medical History:  Diagnosis Date  . Acute MI ([place]) 1999; 2007  . Anemia    hx  . Anginal pain ([place])   . Anxiety   . Arthritis    "generalized" (03/15/2014)  . CAD (coronary artery disease)    MI in 2000 - MI  2007 - treated bare metal stent (no nuclear since then as 9/11)  . Carotid artery disease ([place])   . CHF (congestive heart failure) ([place])   . Chronic diastolic heart failure (HCC)    a) ECHO (08/2013) EF 55-60% and RV function nl b) RHC (08/2013) RA 4, RV 30/5/7, PA 25/10 (16), PCWP 7, Fick CO/CI 6.3/2.7, PVR 1.5 WU, PA 61 and 66%  . Daily headache    "~ every other day; since I fell in June" (03/15/2014)  . Depression   . Dyslipidemia   . Exertional shortness of breath   . HTN (hypertension)   . Hypothyroidism   . Obesity   . Osteoarthritis   . Peripheral neuropathy   . PONV (postoperative nausea and vomiting)   . RBBB (right bundle branch block)    Old  . Renal insufficiency    DENIES  .  Stroke [facility])    mini strokes  . Syncope    likely due to low blood sugar  . Tachycardia    Sinus tachycardia  . Type II diabetes mellitus ([place])   . Urinary incontinence   . Venous insufficiency     Patient Active Problem List   Diagnosis Date Noted  . Impaired ambulation 07/19/2017  . Hyperosmolar non-ketotic state in patient with type 2 diabetes mellitus ([place]) 07/15/2017  . Nausea vomiting and diarrhea 07/15/2017  . Leg cramps 02/27/2017  . Peripheral edema 01/12/2017  . Diabetic neuropathy ([place]) 11/12/2016  . Weakness generalized   . 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 mellitus ([place]) 10/24/2015  . Anemia 10/03/2015  . Generalized anxiety disorder 10/03/2015  . Insomnia 10/03/2015  . Gastroenteritis 06/07/2015  . Diabetes mellitus type II, uncontrolled ([place]) 06/07/2015  . Chronic diastolic CHF (congestive heart failure) ([place]) 06/07/2015  . Non compliance with medical treatment 04/17/2014  . Rotator cuff tear 03/14/2014  . Morbid obesity ([place]) 09/23/2013  . ARF (acute renal failure) ([place]) 12/25/2012  . Generalized weakness 12/25/2012  . Anxiety and depression 12/25/2012  . Urinary incontinence   . MDD (major depressive disorder) 11/12/2010  .  RBBB (right bundle branch block)   . Tachycardia   . CAD (coronary artery disease)   . Hyperlipemia 01/22/2009  . Essential hypertension 01/22/2009  . Shortness of breath 01/22/2009    Past Surgical History:  Procedure Laterality Date  . ABDOMINAL HYSTERECTOMY  1980's  . CATARACT EXTRACTION, BILATERAL Bilateral ?2013  . CORONARY ANGIOPLASTY WITH STENT PLACEMENT  1999; 2007   "1 + 1"  . KNEE ARTHROSCOPY Left 10/25/2006  . RIGHT HEART CATHETERIZATION N/A 09/22/2013   Procedure: RIGHT HEART CATH;  Surgeon: Jolaine Artist, MD;  Location: [facility] CATH LAB;  Service: Cardiovascular;  Laterality: N/A;  . SHOULDER ARTHROSCOPY W/ ROTATOR CUFF REPAIR Right 03/14/2014  . SHOULDER ARTHROSCOPY WITH OPEN ROTATOR CUFF REPAIR Right 03/14/2014     Procedure: RIGHT SHOULDER ARTHROSCOPY WITH BICEPS RELEASE, OPEN SUBSCAPULA REPAIR, OPEN SUPRASPINATUS REPAIR.;  Surgeon: Meredith Pel, MD;  Location: [facility];  Service: Orthopedics;  Laterality: Right;  . TUBAL LIGATION  1970's    OB History    No data available       Home Medications    Prior to Admission medications   Medication Sig Start Date End Date Taking? Authorizing Provider  albuterol (PROVENTIL HFA;VENTOLIN HFA) 108 (90 BASE) MCG/ACT inhaler Inhale 2 puffs into the lungs every 6 (six) hours as needed for wheezing or shortness of breath. Reported on 10/30/2015    [provider]  aspirin EC 81 MG tablet Take 81 mg by mouth daily.    [provider]  carvedilol (COREG) 12.5 MG tablet Take 1 tablet (12.5 mg total) by mouth 2 (two) times daily with a meal. 07/07/17   Dixon, Lonie Peak, PA-C  cefUROXime (CEFTIN) 500 MG tablet Take 1 tablet (500 mg total) by mouth 2 (two) times daily with a meal. 07/18/17   Debbe Odea, MD  clonazePAM (KLONOPIN) 1 MG tablet TAKE 1 TABLET BY MOUTH THREE TIMES A DAY Patient taking differently: TAKE 1mg   TABLET BY MOUTH THREE TIMES A DAY 05/13/17   Galt, [name], MD  clopidogrel (PLAVIX) 75 MG tablet TAKE 1 TABLET (75 MG TOTAL) BY MOUTH DAILY WITH BREAKFAST. 03/04/17   Larey Dresser, MD  ferrous sulfate 325 (65 FE) MG tablet Take 325 mg by mouth daily with breakfast. Reported on 08/21/2015    [provider]  insulin NPH Human (HUMULIN N,NOVOLIN N) 100 UNIT/ML injection Inject 0.9 mLs (90 Units total) into the skin daily before breakfast. Inject 75 units in the morning and 6 units at bedtime 07/19/17   Debbe Odea, MD  insulin NPH Human (HUMULIN N,NOVOLIN N) 100 UNIT/ML injection Inject 0.4 mLs (40 Units total) into the skin daily before supper. 07/19/17   Debbe Odea, MD  insulin regular (NOVOLIN R,HUMULIN R) 250 units/2.71mL (100 units/mL) injection Inject 5-40 Units into the skin 3 (three) times daily before meals.  Sliding scale    [provider]  isosorbide dinitrate (ISORDIL) 10 MG tablet Take 1 tablet (10 mg total) by mouth 2 (two) times daily. 01/16/17   Larey Dresser, MD  levothyroxine (SYNTHROID, LEVOTHROID) 50 MCG tablet Take 1 tablet (50 mcg total) by mouth daily before breakfast. 11/07/16   Dena Billet B, PA-C  losartan (COZAAR) 50 MG tablet TAKE 1 TABLET BY MOUTH EVERY DAY Patient taking differently: TAKE 50 mg  TABLET BY MOUTH EVERY DAY 03/09/17   Alycia Rossetti, MD  Magnesium Oxide 400 (240 Mg) MG TABS TAKE 1 TABLET BY MOUTH EVERY DAY Patient taking differently: TAKE 240 mg  TABLET BY MOUTH EVERY DAY 06/23/17   [name], MD  metolazone (ZAROXOLYN) 2.5 MG tablet Take 1 tablet (2.5 mg total) by mouth once a week. 05/14/17   Bensimhon, Shaune Pascal, MD  metolazone (ZAROXOLYN) 2.5 MG tablet Take 1 tablet (2.5 mg total) by mouth once a week. Patient not taking: Reported on 07/15/2017 06/02/17   Bensimhon, Shaune Pascal, MD  nitroGLYCERIN (NITROSTAT) 0.4 MG SL tablet PLACE 1 TABLET (0.4 MG TOTAL) UNDER THE TONGUE EVERY 5 (FIVE) MINUTES AS NEEDED FOR CHEST PAIN. 08/28/16   [name], PA-C  PARoxetine (PAXIL-CR) 25 MG 24 hr tablet Take 25 mg by mouth daily. Take with 37.5 mg tablet daily (total 62.5 mg)    [provider]  PARoxetine (PAXIL-CR) 25 MG 24 hr tablet TAKE 1 TABLET BY MOUTH EVERY DAY 07/20/17   Alycia Rossetti, MD  PARoxetine (PAXIL-CR) 37.5 MG 24 hr tablet Take 37.5 mg by mouth daily. Take with 25 mg tablet daily (total 62.5 mg)    [provider]  PARoxetine (PAXIL-CR) 37.5 MG 24 hr tablet TAKE 1 TABLET BY MOUTH EVERY DAY Patient not taking: Reported on 07/15/2017 06/23/17   Alycia Rossetti, MD  potassium chloride (K-DUR) 10 MEQ tablet Take 1 tablet (10 mEq total) by mouth daily. Take with the water pill in the morning 02/27/17   Alycia Rossetti, MD  pregabalin (LYRICA) 150 MG capsule Take 1 capsule (150 mg total) by mouth 2 (two) times daily. 11/12/16   Alycia Rossetti, MD  rosuvastatin (CRESTOR) 20 MG tablet Take 1 tablet (20 mg total) by mouth daily. 11/20/16   Alycia Rossetti, MD  torsemide (DEMADEX) 20 MG tablet Take 5 tablets (100 mg total) by mouth 2 (two) times daily. 01/13/17   Conrad [place], NP    Family History Family History  Problem Relation Age of Onset  . Heart attack Mother   . Coronary artery disease Unknown     Social History Social History   Tobacco Use  . Smoking status: Former Smoker    Packs/day: 3.00    Years: 32.00    Pack years: 96.00    Types: Cigarettes    Last attempt to quit: 10/24/1997    Years since quitting: 19.7  . Smokeless tobacco: Never Used  Substance Use Topics  . Alcohol use: Yes    Comment: "might have 2-3 daiquiris in the summer"  . Drug use: No     Allergies   Codeine   Review of Systems Review of Systems  Constitutional: Negative for appetite change, chills, diaphoresis, fatigue and fever.  HENT: Negative for mouth sores, sore throat and trouble swallowing.   Eyes: Negative for visual disturbance.  Respiratory: Negative for cough, chest tightness, shortness of breath and wheezing.   Cardiovascular: Negative for chest pain.  Gastrointestinal: Negative for abdominal distention, abdominal pain, diarrhea, nausea and vomiting.  Endocrine: Negative for polydipsia, polyphagia and polyuria.  Genitourinary: Negative for dysuria, frequency and hematuria.  Musculoskeletal: Negative for gait problem.  Skin: Negative for color change, pallor and rash.  Neurological: Positive for weakness. Negative for dizziness, syncope, light-headedness and headaches.  Hematological: Does not bruise/bleed easily.  Psychiatric/Behavioral: Negative for behavioral problems and confusion.     Physical Exam Updated Vital Signs BP 125/63   Pulse 79   Temp 98.3 F (36.8 C) (Oral)   Resp 18   Ht 5\' 5"  (1.651 m)   Wt 131.5 kg (290 lb)   SpO2 96%  BMI 48.26 kg/m   Physical Exam  Constitutional: She is  oriented to person, place, and time. No distress.  Chronically ill-appearing female.  No distress.  Mucous membranes dry.  HENT:  Head: Normocephalic.  Eyes: Conjunctivae are normal. Pupils are equal, round, and reactive to light. No scleral icterus.  Neck: Normal range of motion. Neck supple. No thyromegaly present.  Cardiovascular: Normal rate and regular rhythm. Exam reveals no gallop and no friction rub.  No murmur heard. Pulmonary/Chest: Effort normal and breath sounds normal. No respiratory distress. She has no wheezes. She has no rales.  Abdominal: Soft. Bowel sounds are normal. She exhibits no distension. There is no tenderness. There is no rebound.  Musculoskeletal:  Diffuse muscular weakness.  No localizing weakness.  Neurological: She is alert and oriented to person, place, and time.  Skin: Skin is warm and dry. No rash noted.  Psychiatric: She has a normal mood and affect. Her behavior is normal.     ED Treatments / Results  Labs (all labs ordered are listed, but only abnormal results are displayed) Labs Reviewed  COMPREHENSIVE METABOLIC PANEL - Abnormal; Notable for the following components:      Result Value   Sodium 133 (*)    CO2 17 (*)    Glucose, Bld 109 (*)    BUN 123 (*)    Creatinine, Ser 4.81 (*)    Calcium 7.7 (*)    Total Protein 6.1 (*)    Albumin 2.5 (*)    Alkaline Phosphatase 148 (*)    GFR calc non Af Amer 8 (*)    GFR calc Af Amer 10 (*)    All other components within normal limits  CBC WITH DIFFERENTIAL/PLATELET - Abnormal; Notable for the following components:   WBC 10.9 (*)    Hemoglobin 10.4 (*)    HCT 32.0 (*)    MCV 77.9 (*)    MCH 25.3 (*)    Neutro Abs 9.4 (*)    All other components within normal limits  I-STAT TROPONIN, ED - Abnormal; Notable for the following components:   Troponin i, poc 0.31 (*)    All other components within normal limits  I-STAT CHEM 8, ED - Abnormal; Notable for the following components:   BUN 122 (*)     Creatinine, Ser 4.80 (*)    Glucose, Bld 103 (*)    Calcium, Ion 0.99 (*)    TCO2 18 (*)    Hemoglobin 10.9 (*)    HCT 32.0 (*)    All other components within normal limits  URINE CULTURE  URINALYSIS, ROUTINE W REFLEX MICROSCOPIC  TROPONIN I  CBG MONITORING, ED  I-STAT CG4 LACTIC ACID, ED    EKG  EKG Interpretation  Date/Time:  Wednesday July 22 2017 16:34:12 EST Ventricular Rate:  81 PR Interval:    QRS Duration: 125 QT Interval:  460 QTC Calculation: 534 R Axis:   -39 Text Interpretation:  Sinus rhythm Right bundle branch block Inferior infarct, old Anterior infarct, old Confirmed by Tanna Furry [phone]) on 07/22/2017 6:07:11 PM       Radiology No results found.  Procedures Procedures (including critical care time)  Medications Ordered in ED Medications  sodium chloride 0.9 % bolus 1,000 mL (1,000 mLs Intravenous New Bag/Given 07/22/17 1901)     Initial Impression / Assessment and Plan / ED Course  I have reviewed the triage vital signs and the nursing notes.  Pertinent labs & imaging results that were available during my care  of the patient were reviewed by me and considered in my medical decision making (see chart for details).   Acute kidney injury.  Appears volume depleted.  Sugars not elevated.  Fluid started here.  Discussed with hospitalist.  Plan admission.  Patient is amenable to consideration of SNF at discharge for deconditioning.  Final Clinical Impressions(s) / ED Diagnoses   Final diagnoses:  AKI (acute kidney injury) [facility])    ED Discharge Orders    None       Tanna Furry, MD 07/22/17 [phone]

## 2017-07-22 NOTE — Telephone Encounter (Signed)
Received call from [place], [facility] OT with [facility].   Reports that she is going to be working with 2x weekly x3 weeks for weakness. Also reports that patient has had #3 falls since leaving hospital.

## 2017-07-22 NOTE — Telephone Encounter (Signed)
Received call from [place], [facility] SN with [facility].   Reports that patient S/P fall x1 this AM with no acute injury. Reports that EMS was called to assist patient off of floor. Patient refused transport to ED for evaluation.   SN out to obtain sample for UA C&S D/T c/o burning with urination. Patient unable to give sample. Requested order for I&O cath. VO given.   States that patient noted to be extremely unsafe transfer and again recommended SNF for rehab. Patient again declined.   MD to be made aware.

## 2017-07-22 NOTE — ED Notes (Signed)
Admitting MD paged to report critical Troponin result

## 2017-07-22 NOTE — Telephone Encounter (Signed)
Received VM from [place], [facility] SN with [facility].   Reports that patient slid to floor again and was unable to get back up. Only son and Sn at home and could not assist her back up.   Called EMS and patient FINALLY agreed to ER evaluation. Currently en route to [facility] ER.

## 2017-07-22 NOTE — H&P (Addendum)
TRH H&P   Patient Demographics:    Susan [name], is a 67 y.o. female  MRN: [id number]   DOB - 02-Sep-1950  Admit Date - 07/22/2017  Outpatient Primary MD for the patient is [name], [name], MD  Referring MD/NP/PA:  Tanna Furry  Outpatient Specialists:   Patient coming from: home  Chief Complaint  Patient presents with  . Fall    X 2      HPI:    Susan [name]  is a 67 y.o. female, w dm2 w neuropathy, CKD stage 4, , hypertension, hyperlipidemia, CAD, CHF (EF ), CVA, apparently went home and had fall x2.  Pt was recommended for SNF but refused to go at discharge on  07/19/2017 Pt states represented due to continued falls and poor po intake since going home.    In ED,  Na 133, K 4.1, Bun 123, Creatinine 4.81, Alb 2.5 Ast 16, Alt 27 Alk phos 148, T. Bili 0.4  Wbc 10.9, Hgb 10.4, Plt 299 Trop 0.31 EKG nsr at 80, borderline LAD Q in 3, avf, RBBB Pt will be admitted for ARF on CRF, and troponin elevation and fall.       Review of systems:    In addition to the HPI above,  No Fever-chills, No Headache, No changes with Vision or hearing, No problems swallowing food or Liquids, No Chest pain, No Cough or Shortness of Breath, No Abdominal pain, No Nausea or Vommitting, Bowel movements are regular, No Blood in stool or Urine, No dysuria, No new skin rashes or bruises, No new joints pains-aches,  No new weakness, tingling, numbness in any extremity, No recent weight gain or loss, No polyuria, polydypsia or polyphagia, No significant Mental Stressors.  A full 10 point Review of Systems was done, except as stated above, all other Review of Systems were negative.   With Past History of the following :    Past Medical History:  Diagnosis Date  . Acute MI ([place]) 1999; 2007  . Anemia    hx  . Anginal pain ([place])   . Anxiety   . Arthritis    "generalized"  (03/15/2014)  . CAD (coronary artery disease)    MI in 2000 - MI  2007 - treated bare metal stent (no nuclear since then as 9/11)  . Carotid artery disease ([place])   . CHF (congestive heart failure) ([place])   . Chronic diastolic heart failure (HCC)    a) ECHO (08/2013) EF 55-60% and RV function nl b) RHC (08/2013) RA 4, RV 30/5/7, PA 25/10 (16), PCWP 7, Fick CO/CI 6.3/2.7, PVR 1.5 WU, PA 61 and 66%  . Daily headache    "~ every other day; since I fell in June" (03/15/2014)  . Depression   . Dyslipidemia   . Exertional shortness of breath   . HTN (hypertension)   . Hypothyroidism   . Obesity   .  Osteoarthritis   . Peripheral neuropathy   . PONV (postoperative nausea and vomiting)   . RBBB (right bundle branch block)    Old  . Renal insufficiency    DENIES  . Stroke [facility])    mini strokes  . Syncope    likely due to low blood sugar  . Tachycardia    Sinus tachycardia  . Type II diabetes mellitus ([place])   . Urinary incontinence   . Venous insufficiency       Past Surgical History:  Procedure Laterality Date  . ABDOMINAL HYSTERECTOMY  1980's  . CATARACT EXTRACTION, BILATERAL Bilateral ?2013  . CORONARY ANGIOPLASTY WITH STENT PLACEMENT  1999; 2007   "1 + 1"  . KNEE ARTHROSCOPY Left 10/25/2006  . RIGHT HEART CATHETERIZATION N/A 09/22/2013   Procedure: RIGHT HEART CATH;  Surgeon: Jolaine Artist, MD;  Location: [facility] CATH LAB;  Service: Cardiovascular;  Laterality: N/A;  . SHOULDER ARTHROSCOPY W/ ROTATOR CUFF REPAIR Right 03/14/2014  . SHOULDER ARTHROSCOPY WITH OPEN ROTATOR CUFF REPAIR Right 03/14/2014   Procedure: RIGHT SHOULDER ARTHROSCOPY WITH BICEPS RELEASE, OPEN SUBSCAPULA REPAIR, OPEN SUPRASPINATUS REPAIR.;  Surgeon: Meredith Pel, MD;  Location: [place];  Service: Orthopedics;  Laterality: Right;  . TUBAL LIGATION  1970's      Social History:     Social History   Tobacco Use  . Smoking status: Former Smoker    Packs/day: 3.00    Years: 32.00    Pack years: 96.00     Types: Cigarettes    Last attempt to quit: 10/24/1997    Years since quitting: 19.7  . Smokeless tobacco: Never Used  Substance Use Topics  . Alcohol use: Yes    Comment: "might have 2-3 daiquiris in the summer"     Lives - at home  Mobility - walks at home   Family History :     Family History  Problem Relation Age of Onset  . Heart attack Mother   . Coronary artery disease Unknown       Home Medications:   Prior to Admission medications   Medication Sig Start Date End Date Taking? Authorizing Provider  albuterol (PROVENTIL HFA;VENTOLIN HFA) 108 (90 BASE) MCG/ACT inhaler Inhale 2 puffs into the lungs every 6 (six) hours as needed for wheezing or shortness of breath. Reported on 10/30/2015    [provider]  aspirin EC 81 MG tablet Take 81 mg by mouth daily.    [provider]  carvedilol (COREG) 12.5 MG tablet Take 1 tablet (12.5 mg total) by mouth 2 (two) times daily with a meal. 07/07/17   Dixon, Lonie Peak, PA-C  cefUROXime (CEFTIN) 500 MG tablet Take 1 tablet (500 mg total) by mouth 2 (two) times daily with a meal. 07/18/17   Debbe Odea, MD  clonazePAM (KLONOPIN) 1 MG tablet TAKE 1 TABLET BY MOUTH THREE TIMES A DAY Patient taking differently: TAKE 16m  TABLET BY MOUTH THREE TIMES A DAY 05/13/17   [place], KModena Nunnery MD  clopidogrel (PLAVIX) 75 MG tablet TAKE 1 TABLET (75 MG TOTAL) BY MOUTH DAILY WITH BREAKFAST. 03/04/17   MLarey Dresser MD  ferrous sulfate 325 (65 FE) MG tablet Take 325 mg by mouth daily with breakfast. Reported on 08/21/2015    [provider]  insulin NPH Human (HUMULIN N,NOVOLIN N) 100 UNIT/ML injection Inject 0.9 mLs (90 Units total) into the skin daily before breakfast. Inject 75 units in the morning and 6 units at bedtime 07/19/17   Rizwan,  Saima, MD  insulin NPH Human (HUMULIN N,NOVOLIN N) 100 UNIT/ML injection Inject 0.4 mLs (40 Units total) into the skin daily before supper. 07/19/17   Debbe Odea, MD  insulin regular (NOVOLIN  R,HUMULIN R) 250 units/2.23m (100 units/mL) injection Inject 5-40 Units into the skin 3 (three) times daily before meals. Sliding scale    [provider]  isosorbide dinitrate (ISORDIL) 10 MG tablet Take 1 tablet (10 mg total) by mouth 2 (two) times daily. 01/16/17   MLarey Dresser MD  levothyroxine (SYNTHROID, LEVOTHROID) 50 MCG tablet Take 1 tablet (50 mcg total) by mouth daily before breakfast. 11/07/16   DDena BilletB, PA-C  losartan (COZAAR) 50 MG tablet TAKE 1 TABLET BY MOUTH EVERY DAY Patient taking differently: TAKE 50 mg  TABLET BY MOUTH EVERY DAY 03/09/17   DAlycia Rossetti MD  Magnesium Oxide 400 (240 Mg) MG TABS TAKE 1 TABLET BY MOUTH EVERY DAY Patient taking differently: TAKE 240 mg TABLET BY MOUTH EVERY DAY 06/23/17   PSusy Frizzle MD  metolazone (ZAROXOLYN) 2.5 MG tablet Take 1 tablet (2.5 mg total) by mouth once a week. 05/14/17   Bensimhon, DShaune Pascal MD  metolazone (ZAROXOLYN) 2.5 MG tablet Take 1 tablet (2.5 mg total) by mouth once a week. Patient not taking: Reported on 07/15/2017 06/02/17   Bensimhon, DShaune Pascal MD  nitroGLYCERIN (NITROSTAT) 0.4 MG SL tablet PLACE 1 TABLET (0.4 MG TOTAL) UNDER THE TONGUE EVERY 5 (FIVE) MINUTES AS NEEDED FOR CHEST PAIN. 08/28/16   DOrlena Sheldon PA-C  PARoxetine (PAXIL-CR) 25 MG 24 hr tablet Take 25 mg by mouth daily. Take with 37.5 mg tablet daily (total 62.5 mg)    [provider]  PARoxetine (PAXIL-CR) 25 MG 24 hr tablet TAKE 1 TABLET BY MOUTH EVERY DAY 07/20/17   DAlycia Rossetti MD  PARoxetine (PAXIL-CR) 37.5 MG 24 hr tablet Take 37.5 mg by mouth daily. Take with 25 mg tablet daily (total 62.5 mg)    [provider]  PARoxetine (PAXIL-CR) 37.5 MG 24 hr tablet TAKE 1 TABLET BY MOUTH EVERY DAY Patient not taking: Reported on 07/15/2017 06/23/17   DAlycia Rossetti MD  potassium chloride (K-DUR) 10 MEQ tablet Take 1 tablet (10 mEq total) by mouth daily. Take with the water pill in the morning 02/27/17   DAlycia Rossetti MD  pregabalin (LYRICA) 150 MG capsule Take 1 capsule (150 mg total) by mouth 2 (two) times daily. 11/12/16   DAlycia Rossetti MD  rosuvastatin (CRESTOR) 20 MG tablet Take 1 tablet (20 mg total) by mouth daily. 11/20/16   DAlycia Rossetti MD  torsemide (DEMADEX) 20 MG tablet Take 5 tablets (100 mg total) by mouth 2 (two) times daily. 01/13/17   CDarrick GrinderD, NP     Allergies:     Allergies  Allergen Reactions  . Codeine Nausea And Vomiting     Physical Exam:   Vitals  Blood pressure (!) 130/56, pulse 78, temperature 98.3 F (36.8 C), temperature source Oral, resp. rate 15, height '5\' 5"'  (1.651 m), weight 131.5 kg (290 lb), SpO2 95 %.   1. General  lying in bed in NAD,  2. Normal affect and insight, Not Suicidal or Homicidal, Awake Alert, Oriented X 3.  3. No F.N deficits, ALL C.Nerves Intact, Strength 5/5 all 4 extremities, Sensation intact all 4 extremities, Plantars down going.  4. Ears and Eyes appear Normal, Conjunctivae clear, PERRLA. Moist Oral Mucosa.  5. Supple Neck, No JVD,  No cervical lymphadenopathy appriciated, No Carotid Bruits.  6. Symmetrical Chest wall movement, Good air movement bilaterally, CTAB.  7. RRR, No Gallops, Rubs or Murmurs, No Parasternal Heave.  8. Positive Bowel Sounds, Abdomen Soft, No tenderness, No organomegaly appriciated,No rebound -guarding or rigidity.  9.  No Cyanosis, Normal Skin Turgor, No Skin Rash or Bruise.  10. Good muscle tone,  joints appear normal , no effusions, Normal ROM.  11. No Palpable Lymph Nodes in Neck or Axillae     Data Review:    CBC Recent Labs  Lab 07/15/17 2050 07/16/17 0349 07/22/17 1725 07/22/17 1745  WBC  --  4.7 10.9*  --   HGB 9.2* 10.1* 10.4* 10.9*  HCT 27.0* 30.7* 32.0* 32.0*  PLT  --  173 299  --   MCV  --  77.1* 77.9*  --   MCH  --  25.4* 25.3*  --   MCHC  --  32.9 32.5  --   RDW  --  15.1 15.5  --   LYMPHSABS  --   --  1.1  --   MONOABS  --   --  0.3  --   EOSABS  --   --  0.1   --   BASOSABS  --   --  0.0  --    ------------------------------------------------------------------------------------------------------------------  Chemistries  Recent Labs  Lab 07/16/17 0349 07/16/17 0933 07/17/17 0811 07/18/17 0401 07/22/17 1725 07/22/17 1745  NA 135 137 136 133* 133* 135  K 3.6 3.7 3.8 3.5 4.1 4.1  CL 98* 103 104 100* 102 102  CO2 26 23 21* 21* 17*  --   GLUCOSE 146* 139* 201* 258* 109* 103*  BUN 51* 47* 41* 49* 123* 122*  CREATININE 2.46* 2.18* 2.22* 2.60* 4.81* 4.80*  CALCIUM 8.7* 8.7* 8.6* 8.4* 7.7*  --   AST  --   --   --   --  16  --   ALT  --   --   --   --  27  --   ALKPHOS  --   --   --   --  148*  --   BILITOT  --   --   --   --  0.4  --    ------------------------------------------------------------------------------------------------------------------ estimated creatinine clearance is 15.4 mL/min (A) (by C-G formula based on SCr of 4.8 mg/dL (H)). ------------------------------------------------------------------------------------------------------------------ No results for input(s): TSH, T4TOTAL, T3FREE, THYROIDAB in the last 72 hours.  Invalid input(s): FREET3  Coagulation profile No results for input(s): INR, PROTIME in the last 168 hours. ------------------------------------------------------------------------------------------------------------------- No results for input(s): DDIMER in the last 72 hours. -------------------------------------------------------------------------------------------------------------------  Cardiac Enzymes No results for input(s): CKMB, TROPONINI, MYOGLOBIN in the last 168 hours.  Invalid input(s): CK ------------------------------------------------------------------------------------------------------------------    Component Value Date/Time   BNP 96.3 04/29/2017 1109   BNP 222.3 (H) 10/03/2015 1657      ---------------------------------------------------------------------------------------------------------------  Urinalysis    Component Value Date/Time   COLORURINE YELLOW 07/15/2017 2009   APPEARANCEUR CLEAR 07/15/2017 2009   LABSPEC 1.012 07/15/2017 2009   PHURINE 5.0 07/15/2017 2009   GLUCOSEU >=500 (A) 07/15/2017 2009   HGBUR MODERATE (A) 07/15/2017 2009   BILIRUBINUR NEGATIVE 07/15/2017 2009   [place] NEGATIVE 07/15/2017 2009   PROTEINUR 30 (A) 07/15/2017 2009   UROBILINOGEN 0.2 04/01/2014 1659   NITRITE NEGATIVE 07/15/2017 2009   LEUKOCYTESUR NEGATIVE 07/15/2017 2009    ----------------------------------------------------------------------------------------------------------------   Imaging Results:    No results found.     Assessment & Plan:  Principal Problem:   ARF (acute renal failure) (HCC) Active Problems:   Diabetes mellitus type II, uncontrolled (HCC)   Anemia  ARF on CRF Check CPK, MB STOP LOSARTAN STOP TORSEMIDE Check urine sodium, urine creatinine, urine eosinophils Check renal ultrasound Hydrate with ns iv (gently in light of hx of CHF)  Elevated trop w/o chest pain Tele Trop I q6h x3 Check cardiac echo  DM2 fsbs ac and qhs, ISS  Diabetic neuropathy Cont lyrica  CAD Cont aspirin Cont plavix Cont Carvedilol Cont Crestor, Decrease dose due to ARF  CHF STOP ZAROXOLYN STOP TORSEMIDE Cont Carvedilol Cont Isordil  Hypothyroidism Cont Levothyroxine  Anemia (B12, Iron deficiency) Check cbc in am Start vitamin b12,1000 micrograms im x1 and then 2065mcrograms po qday  Depression Cont Paxil  Severe protein calorie malnutrition Start Prostat  DVT Prophylaxis  heparin- SCDs  AM Labs Ordered, also please review Full Orders  Family Communication: Admission, patients condition and plan of care including tests being ordered have been discussed with the patient  who indicate understanding and agree with the plan and Code  Status.  Code Status FULL CODE  Likely DC to  home  Condition GUARDED    Consults called:  Cardiology consult for troponin elevation  Admission status: inpatient  Time spent in minutes : 45   JJani GravelM.D on 07/22/2017 at 7:01 PM  Between 7am to 7pm - Pager - [phone] After 7pm go to [email or web address] - password [email or web address] - Office  [phone]

## 2017-07-22 NOTE — Telephone Encounter (Signed)
noted 

## 2017-07-23 ENCOUNTER — Inpatient Hospital Stay (HOSPITAL_COMMUNITY): Payer: PPO

## 2017-07-23 ENCOUNTER — Encounter (HOSPITAL_COMMUNITY): Payer: Self-pay | Admitting: Emergency Medicine

## 2017-07-23 DIAGNOSIS — R748 Abnormal levels of other serum enzymes: Secondary | ICD-10-CM

## 2017-07-23 DIAGNOSIS — G9341 Metabolic encephalopathy: Secondary | ICD-10-CM

## 2017-07-23 DIAGNOSIS — I5032 Chronic diastolic (congestive) heart failure: Secondary | ICD-10-CM

## 2017-07-23 DIAGNOSIS — N390 Urinary tract infection, site not specified: Secondary | ICD-10-CM

## 2017-07-23 DIAGNOSIS — N179 Acute kidney failure, unspecified: Secondary | ICD-10-CM

## 2017-07-23 LAB — COMPREHENSIVE METABOLIC PANEL
ALT: 22 U/L (ref 14–54)
AST: 12 U/L — ABNORMAL LOW (ref 15–41)
Albumin: 2.4 g/dL — ABNORMAL LOW (ref 3.5–5.0)
Alkaline Phosphatase: 134 U/L — ABNORMAL HIGH (ref 38–126)
Anion gap: 16 — ABNORMAL HIGH (ref 5–15)
BUN: 126 mg/dL — ABNORMAL HIGH (ref 6–20)
CO2: 16 mmol/L — ABNORMAL LOW (ref 22–32)
Calcium: 7.7 mg/dL — ABNORMAL LOW (ref 8.9–10.3)
Chloride: 102 mmol/L (ref 101–111)
Creatinine, Ser: 4.47 mg/dL — ABNORMAL HIGH (ref 0.44–1.00)
GFR calc Af Amer: 11 mL/min — ABNORMAL LOW (ref >60)
GFR calc non Af Amer: 9 mL/min — ABNORMAL LOW (ref >60)
Glucose, Bld: 178 mg/dL — ABNORMAL HIGH (ref 65–99)
Potassium: 4 mmol/L (ref 3.5–5.1)
Sodium: 134 mmol/L — ABNORMAL LOW (ref 135–145)
Total Bilirubin: 0.4 mg/dL (ref 0.3–1.2)
Total Protein: 5.8 g/dL — ABNORMAL LOW (ref 6.5–8.1)

## 2017-07-23 LAB — CBG MONITORING, ED
Glucose-Capillary: 164 mg/dL — ABNORMAL HIGH (ref 65–99)
Glucose-Capillary: 170 mg/dL — ABNORMAL HIGH (ref 65–99)
Glucose-Capillary: 106 mg/dL — ABNORMAL HIGH (ref 65–99)

## 2017-07-23 LAB — CBC
HCT: 29.7 % — ABNORMAL LOW (ref 36.0–46.0)
Hemoglobin: 9.5 g/dL — ABNORMAL LOW (ref 12.0–15.0)
MCH: 24.9 pg — ABNORMAL LOW (ref 26.0–34.0)
MCHC: 32 g/dL (ref 30.0–36.0)
MCV: 78 fL (ref 78.0–100.0)
Platelets: 269 10*3/uL (ref 150–400)
RBC: 3.81 MIL/uL — ABNORMAL LOW (ref 3.87–5.11)
RDW: 15.6 % — ABNORMAL HIGH (ref 11.5–15.5)
WBC: 8.8 10*3/uL (ref 4.0–10.5)

## 2017-07-23 LAB — CK TOTAL AND CKMB (NOT AT ARMC)
CK, MB: 3.3 ng/mL (ref 0.5–5.0)
Relative Index: INVALID (ref 0.0–2.5)
Total CK: 63 U/L (ref 38–234)

## 2017-07-23 LAB — TROPONIN I
Troponin I: 0.17 ng/mL (ref <0.03)
Troponin I: 0.15 ng/mL (ref <0.03)
Troponin I: 0.12 ng/mL (ref <0.03)
Troponin I: 0.15 ng/mL (ref <0.03)

## 2017-07-23 LAB — SODIUM, URINE, RANDOM: Sodium, Ur: 26 mmol/L

## 2017-07-23 LAB — MRSA PCR SCREENING: MRSA by PCR: NEGATIVE

## 2017-07-23 LAB — ECHOCARDIOGRAM COMPLETE
Height: 65 in
Weight: 4640 oz

## 2017-07-23 LAB — GLUCOSE, CAPILLARY
Glucose-Capillary: 66 mg/dL (ref 65–99)
Glucose-Capillary: 82 mg/dL (ref 65–99)

## 2017-07-23 MED ORDER — PAROXETINE HCL ER 25 MG PO TB24
25.0000 mg | ORAL_TABLET | Freq: Every day | ORAL | Status: DC
  Administered 2017-07-24 – 2017-07-28 (×5): 25 mg via ORAL
  Filled 2017-07-23 (×6): qty 1

## 2017-07-23 MED ORDER — ISOSORBIDE DINITRATE 10 MG PO TABS
10.0000 mg | ORAL_TABLET | Freq: Two times a day (BID) | ORAL | Status: DC
  Administered 2017-07-24 – 2017-07-28 (×10): 10 mg via ORAL
  Filled 2017-07-23 (×13): qty 1

## 2017-07-23 MED ORDER — SODIUM CHLORIDE 0.9 % IV SOLN
2.0000 g | INTRAVENOUS | Status: DC
  Administered 2017-07-23 – 2017-07-25 (×3): 2 g via INTRAVENOUS
  Filled 2017-07-23 (×3): qty 20

## 2017-07-23 MED ORDER — CLOPIDOGREL BISULFATE 75 MG PO TABS
75.0000 mg | ORAL_TABLET | Freq: Every day | ORAL | Status: DC
  Administered 2017-07-23 – 2017-07-28 (×6): 75 mg via ORAL
  Filled 2017-07-23 (×6): qty 1

## 2017-07-23 MED ORDER — FERROUS SULFATE 325 (65 FE) MG PO TABS
325.0000 mg | ORAL_TABLET | Freq: Every day | ORAL | Status: DC
  Administered 2017-07-23 – 2017-07-28 (×6): 325 mg via ORAL
  Filled 2017-07-23 (×6): qty 1

## 2017-07-23 MED ORDER — CEFUROXIME AXETIL 500 MG PO TABS: 500.0000 mg | ORAL_TABLET | Freq: Two times a day (BID) | ORAL | Status: DC

## 2017-07-23 MED ORDER — PREGABALIN 25 MG PO CAPS
25.0000 mg | ORAL_CAPSULE | Freq: Once | ORAL | Status: DC
  Filled 2017-07-23: qty 1

## 2017-07-23 MED ORDER — INSULIN NPH (HUMAN) (ISOPHANE) 100 UNIT/ML ~~LOC~~ SUSP
90.0000 [IU] | Freq: Every day | SUBCUTANEOUS | Status: DC
  Administered 2017-07-23 – 2017-07-28 (×5): 90 [IU] via SUBCUTANEOUS
  Filled 2017-07-23: qty 10

## 2017-07-23 MED ORDER — ACETAMINOPHEN 650 MG RE SUPP: 650.0000 mg | Freq: Four times a day (QID) | RECTAL | Status: DC | PRN

## 2017-07-23 MED ORDER — LEVOTHYROXINE SODIUM 50 MCG PO TABS
50.0000 ug | ORAL_TABLET | Freq: Every day | ORAL | Status: DC
  Administered 2017-07-23 – 2017-07-28 (×6): 50 ug via ORAL
  Filled 2017-07-23 (×6): qty 1

## 2017-07-23 MED ORDER — PERFLUTREN LIPID MICROSPHERE
1.0000 mL | INTRAVENOUS | Status: AC | PRN
  Administered 2017-07-23: 3 mL via INTRAVENOUS
  Filled 2017-07-23: qty 10

## 2017-07-23 MED ORDER — CLONAZEPAM 1 MG PO TABS
1.0000 mg | ORAL_TABLET | Freq: Three times a day (TID) | ORAL | Status: DC
  Administered 2017-07-24 – 2017-07-28 (×13): 1 mg via ORAL
  Filled 2017-07-23 (×14): qty 1

## 2017-07-23 MED ORDER — SODIUM CHLORIDE 0.9 % IV SOLN
INTRAVENOUS | Status: DC
  Administered 2017-07-23: 18:00:00 via INTRAVENOUS
  Administered 2017-07-24: 1000 mL via INTRAVENOUS

## 2017-07-23 MED ORDER — ACETAMINOPHEN 325 MG PO TABS: 650.0000 mg | ORAL_TABLET | Freq: Four times a day (QID) | ORAL | Status: DC | PRN

## 2017-07-23 MED ORDER — ALBUTEROL SULFATE (2.5 MG/3ML) 0.083% IN NEBU: 2.5000 mg | INHALATION_SOLUTION | Freq: Four times a day (QID) | RESPIRATORY_TRACT | Status: DC | PRN

## 2017-07-23 MED ORDER — HEPARIN SODIUM (PORCINE) 5000 UNIT/ML IJ SOLN
5000.0000 [IU] | Freq: Three times a day (TID) | INTRAMUSCULAR | Status: DC
  Administered 2017-07-23 – 2017-07-24 (×4): 5000 [IU] via SUBCUTANEOUS
  Filled 2017-07-23 (×3): qty 1

## 2017-07-23 MED ORDER — ROSUVASTATIN CALCIUM 10 MG PO TABS
5.0000 mg | ORAL_TABLET | Freq: Every day | ORAL | Status: DC
  Administered 2017-07-23 – 2017-07-28 (×6): 5 mg via ORAL
  Filled 2017-07-23 (×7): qty 1

## 2017-07-23 MED ORDER — INSULIN NPH (HUMAN) (ISOPHANE) 100 UNIT/ML ~~LOC~~ SUSP
6.0000 [IU] | Freq: Every day | SUBCUTANEOUS | Status: DC
  Administered 2017-07-24 – 2017-07-28 (×5): 6 [IU] via SUBCUTANEOUS
  Filled 2017-07-23: qty 10

## 2017-07-23 MED ORDER — PREGABALIN 75 MG PO CAPS
75.0000 mg | ORAL_CAPSULE | Freq: Two times a day (BID) | ORAL | Status: DC
  Administered 2017-07-23 – 2017-07-28 (×11): 75 mg via ORAL
  Filled 2017-07-23 (×6): qty 1
  Filled 2017-07-23: qty 3
  Filled 2017-07-23 (×5): qty 1

## 2017-07-23 MED ORDER — SODIUM CHLORIDE 0.9 % IV SOLN
INTRAVENOUS | Status: AC
  Administered 2017-07-23: 07:00:00 via INTRAVENOUS

## 2017-07-23 MED ORDER — INSULIN ASPART 100 UNIT/ML ~~LOC~~ SOLN
0.0000 [IU] | Freq: Three times a day (TID) | SUBCUTANEOUS | Status: DC
Start: 2017-07-23 — End: 2017-07-28
  Administered 2017-07-24: 2 [IU] via SUBCUTANEOUS
  Administered 2017-07-24: 1 [IU] via SUBCUTANEOUS
  Administered 2017-07-25 (×2): 2 [IU] via SUBCUTANEOUS
  Administered 2017-07-26: 5 [IU] via SUBCUTANEOUS
  Administered 2017-07-26 – 2017-07-27 (×3): 3 [IU] via SUBCUTANEOUS
  Administered 2017-07-27: 9 [IU] via SUBCUTANEOUS
  Administered 2017-07-27: 3 [IU] via SUBCUTANEOUS
  Administered 2017-07-28: 9 [IU] via SUBCUTANEOUS
  Administered 2017-07-28: 7 [IU] via SUBCUTANEOUS
  Administered 2017-07-28: 5 [IU] via SUBCUTANEOUS

## 2017-07-23 MED ORDER — CARVEDILOL 12.5 MG PO TABS
12.5000 mg | ORAL_TABLET | Freq: Two times a day (BID) | ORAL | Status: DC
  Administered 2017-07-23 – 2017-07-28 (×12): 12.5 mg via ORAL
  Filled 2017-07-23 (×12): qty 1

## 2017-07-23 MED ORDER — ALBUTEROL SULFATE HFA 108 (90 BASE) MCG/ACT IN AERS
2.0000 | INHALATION_SPRAY | Freq: Four times a day (QID) | RESPIRATORY_TRACT | Status: DC | PRN
  Administered 2017-07-23: 2 via RESPIRATORY_TRACT
  Filled 2017-07-23: qty 6.7

## 2017-07-23 MED ORDER — MAGNESIUM OXIDE 400 (241.3 MG) MG PO TABS
400.0000 mg | ORAL_TABLET | Freq: Every day | ORAL | Status: DC
  Administered 2017-07-24 – 2017-07-28 (×5): 400 mg via ORAL
  Filled 2017-07-23 (×7): qty 1

## 2017-07-23 MED ORDER — INSULIN ASPART 100 UNIT/ML ~~LOC~~ SOLN
0.0000 [IU] | Freq: Every day | SUBCUTANEOUS | Status: DC
  Administered 2017-07-26: 2 [IU] via SUBCUTANEOUS
  Administered 2017-07-27: 5 [IU] via SUBCUTANEOUS

## 2017-07-23 MED ORDER — POTASSIUM CHLORIDE CRYS ER 10 MEQ PO TBCR
10.0000 meq | EXTENDED_RELEASE_TABLET | Freq: Every day | ORAL | Status: DC
  Administered 2017-07-23 – 2017-07-28 (×6): 10 meq via ORAL
  Filled 2017-07-23 (×7): qty 1

## 2017-07-23 MED ORDER — ASPIRIN EC 81 MG PO TBEC
81.0000 mg | DELAYED_RELEASE_TABLET | Freq: Every day | ORAL | Status: DC
  Administered 2017-07-23 – 2017-07-28 (×6): 81 mg via ORAL
  Filled 2017-07-23 (×6): qty 1

## 2017-07-23 MED ORDER — INSULIN NPH (HUMAN) (ISOPHANE) 100 UNIT/ML ~~LOC~~ SUSP: 40.0000 [IU] | Freq: Every day | SUBCUTANEOUS | Status: DC

## 2017-07-23 MED ORDER — PAROXETINE HCL ER 37.5 MG PO TB24
37.5000 mg | ORAL_TABLET | Freq: Every day | ORAL | Status: DC
  Administered 2017-07-24 – 2017-07-28 (×6): 37.5 mg via ORAL
  Filled 2017-07-23 (×6): qty 1

## 2017-07-23 NOTE — ED Notes (Signed)
Meal tray at bedside.  

## 2017-07-23 NOTE — Clinical Social Work Note (Signed)
Clinical Social Work Assessment  Patient Details  Name: Susan [name] MRN: [id number] Date of Birth: 06/13/1950  Date of referral:  07/23/17               Reason for consult:  Facility Placement                Permission sought to share information with:  Family Supports Permission granted to share information::  No  Name::        Agency::     Relationship::     Contact Information:     Housing/Transportation Living arrangements for the past 2 months:  Single Family Home(with husband) Source of Information:  Patient Patient Interpreter Needed:  None Criminal Activity/Legal Involvement Pertinent to Current Situation/Hospitalization:  No - Comment as needed Significant Relationships:  Spouse Lives with:  Spouse Do you feel safe going back to the place where you live?  Yes Need for family participation in patient care:     Care giving concerns:  CSW spoke with pt at bedside. At this time pt did not express any concerns to CSW.    Social Worker assessment / plan:  CSW spoke with pt at bedside. During this time pt was falling asleep while speaking with CSW. Pt was able to tell CSW that pt is from home with husband and husband helps take care of pt as needed. Pt expressed being agreeable to placement if needed at the time of discharge.  Employment status:  Other (Comment)(unknown.) Insurance information:  Other (Comment Required) PT Recommendations:  Not assessed at this time Information / Referral to community resources:  [place]  Patient/Family's Response to care:  Pt was falling asleep, so CSW is unsure if pt is understanding of plan of care at this time.   Patient/Family's Understanding of and Emotional Response to Diagnosis, Current Treatment, and Prognosis:  No further questions or concerns have been presented to CSW at this time.   Emotional Assessment Appearance:  Appears stated age Attitude/Demeanor/Rapport:  Other(pt was falling asleep. ) Affect  (typically observed):  Pleasant Orientation:  Oriented to Self, Oriented to Place, Oriented to  Time, Oriented to Situation Alcohol / Substance use:  Not Applicable Psych involvement (Current and /or in the community):  No (Comment)  Discharge Needs  Concerns to be addressed:  Denies Needs/Concerns at this time Readmission within the last 30 days:    Current discharge risk:  Dependent with Mobility Barriers to Discharge:  Continued Medical Work up   [employer], [place] 07/23/2017, 10:20 AM

## 2017-07-23 NOTE — Progress Notes (Signed)
[facility]  Patient Status: Active (receiving services up to time of hospitalization)  [facility] is providing the following services: RN  If patient discharges after hours, please call [phone].   Susan [name] 07/23/2017, 3:44 PM

## 2017-07-23 NOTE — Care Management Note (Signed)
Case Management Note  Patient Details  Name: Susan [name] MRN: [id number] Date of Birth: 1950-06-05  Subjective/Objective:    Recently admitted admitted 02/20-02/24/2019 with Hyperosmolar non-ketotic state; After d/c, pt w/ repeated falls>>came back to the ER on 02/27, w/ SBP 70s, CBG >1000. Also trouble swallowing solid   Action/Plan: Cardiology consult for troponin elevation; Recommend medical therapy for CAD with aspirin, Plavix, beta-blocker as blood pressure and heart rate will tolerate and statin.  Check lipids and LFTs in a.m. 2/29/19, may need a higher dose of Crestor.  In-House Referral:  Clinical Social Work  [employer]  CM Consult  Status of Service:  In process, will continue to follow  If discussed at Long Length of Stay Meetings, dates discussed:    Additional Comments:  Kristen Cardinal, RN 07/23/2017, 3:40 PM

## 2017-07-23 NOTE — ED Notes (Signed)
Got patient undress into a gown patient is resting with call bell in reach 

## 2017-07-23 NOTE — ED Notes (Addendum)
PT not wanting to eat breakfast. RN encouraged her to drink water as her mucous membranes are very dry. Troponin drawn. Feels warm to touch. Afebrile. Wheezing noted.

## 2017-07-23 NOTE — ED Notes (Signed)
Checked patient blood sugar it was 105 notified RN of blood sugar patient is resting with family at bedside and call bell in reach

## 2017-07-23 NOTE — ED Notes (Signed)
Attempted to call report

## 2017-07-23 NOTE — ED Notes (Signed)
Breakfast tray ordered; heart healthy diet

## 2017-07-23 NOTE — Progress Notes (Signed)
PT Cancellation Note  Patient Details Name: Shailah Gibbins MRN: [id number] DOB: 1951-05-14   Cancelled Treatment:    Reason Eval/Treat Not Completed: Medical issues which prohibited therapy Per RN pt very drowsy and RN contacting MD for medical concerns. Will hold off until medically appropriate and as schedule allows.   Leighton Ruff, PT, DPT  Acute Rehabilitation Services  Pager: [phone]  Rudean Hitt 07/23/2017, 11:11 AM

## 2017-07-23 NOTE — ED Notes (Signed)
Patient transported to CT 

## 2017-07-23 NOTE — ED Notes (Signed)
Attempted to call report - this RN to do bedside report, charge aware

## 2017-07-23 NOTE — Progress Notes (Signed)
PROGRESS NOTE  Susan [name] DXI:[id number] DOB: 24-Oct-1950 DOA: 07/22/2017 PCP: Alycia Rossetti, MD  HPI/Recap of past 24 hours: Susan [name]  is a 67 y.o. female, w dm2 w neuropathy, CKD stage 4, , hypertension, hyperlipidemia, CAD, CHF (EF ), CVA, apparently went home and had fall x2.  Pt was recommended for SNF but refused to go at discharge on  07/19/2017 Pt states represented due to continued falls and poor po intake since going home.   07/23/17: seen and examined in the ED. Somnolent but arousable to voices. Reports pain in her head and neck CT head and CT neck ordered, no acute abnormalities. 2D echo LVEF 76-73% grade 1 diastolic dysfunction   Assessment/Plan: Principal Problem:   ARF (acute renal failure) (HCC) Active Problems:   Diabetes mellitus type II, uncontrolled (HCC)   Anemia   Elevated troponin  Acute metabolic encephalopathy  -suspect 2/2 to UTI, poa vs others -CT head neck negative for acute findings -IV ceftriaxone started -continue close monitoring  UTI, poa -IV ceftriaxone -urine cx pending  Elevated troponin -trop 0.26 peaked -continue trending, trended down to 0.12 -EKG no ST T elevation  AKI on CKD IV -hold diuretics and ARBs -continue to monitor urine output -avoid nephrotoxic agents, hypotension, dehydration -cr 4.47 from 4.80 baseline cr 2.6 -gentle IV fluid hydration -BMP am  CAD -continue asa, coreg, crestor  DM2/diabetic polyneuropathy -ISS -A1C -lyrica  CHFPEF 55-60% -hold diuretics and ARBs due to AKI  Depression -paxil  Morbid obesity -weight loss -outpatient follow up   AKI on CKD 3   Code Status: full  Family Communication: none at bedside   Disposition Plan: to be determined   Consultants:  none  Procedures:  none  Antimicrobials:  ceftriaxone  DVT prophylaxis:  SCDs   Objective: Vitals:   07/23/17 0400 07/23/17 0500 07/23/17 0600 07/23/17 0700  BP: (!) 135/37 (!) 139/58 112/80 131/79    Pulse: 81 83 84 84  Resp: 16 17 15 14   Temp:      TempSrc:      SpO2: 96% 95% 98% 95%  Weight:      Height:        Intake/Output Summary (Last 24 hours) at 07/23/2017 0723 Last data filed at 07/22/2017 1958 Gross per 24 hour  Intake 1000 ml  Output -  Net 1000 ml   Filed Weights   07/22/17 1644  Weight: 131.5 kg (290 lb)    Exam:   General:  67 yo morbidly obese CF somnolent but arouses to voices  Cardiovascular: RRR no rubs or gallops  Respiratory: CTA no wheezes or rales   Abdomen: obese NBS  Musculoskeletal: does not follow command to assess symmetry  Skin: LE chronic venous dermatitis  Psychiatry: unable to assess due to somnolence    Data Reviewed: CBC: Recent Labs  Lab 07/22/17 1725 07/22/17 1745 07/23/17 0559  WBC 10.9*  --  8.8  NEUTROABS 9.4*  --   --   HGB 10.4* 10.9* 9.5*  HCT 32.0* 32.0* 29.7*  MCV 77.9*  --  78.0  PLT 299  --  419   Basic Metabolic Panel: Recent Labs  Lab 07/16/17 0933 07/17/17 0811 07/18/17 0401 07/22/17 1725 07/22/17 1745  NA 137 136 133* 133* 135  K 3.7 3.8 3.5 4.1 4.1  CL 103 104 100* 102 102  CO2 23 21* 21* 17*  --   GLUCOSE 139* 201* 258* 109* 103*  BUN 47* 41* 49* 123* 122*  CREATININE 2.18* 2.22*  2.60* 4.81* 4.80*  CALCIUM 8.7* 8.6* 8.4* 7.7*  --    GFR: Estimated Creatinine Clearance: 15.4 mL/min (A) (by C-G formula based on SCr of 4.8 mg/dL (H)). Liver Function Tests: Recent Labs  Lab 07/22/17 1725  AST 16  ALT 27  ALKPHOS 148*  BILITOT 0.4  PROT 6.1*  ALBUMIN 2.5*   No results for input(s): LIPASE, AMYLASE in the last 168 hours. No results for input(s): AMMONIA in the last 168 hours. Coagulation Profile: No results for input(s): INR, PROTIME in the last 168 hours. Cardiac Enzymes: Recent Labs  Lab 07/22/17 1738 07/22/17 2155  CKTOTAL  --  84  CKMB  --  5.2*  TROPONINI 0.26*  --    BNP (last 3 results) No results for input(s): PROBNP in the last 8760 hours. HbA1C: No results for  input(s): HGBA1C in the last 72 hours. CBG: Recent Labs  Lab 07/19/17 1149 07/19/17 1326 07/19/17 1513 07/19/17 1653 07/22/17 1741  GLUCAP 407* 357* 315* 315* 86   Lipid Profile: No results for input(s): CHOL, HDL, LDLCALC, TRIG, CHOLHDL, LDLDIRECT in the last 72 hours. Thyroid Function Tests: No results for input(s): TSH, T4TOTAL, FREET4, T3FREE, THYROIDAB in the last 72 hours. Anemia Panel: No results for input(s): VITAMINB12, FOLATE, FERRITIN, TIBC, IRON, RETICCTPCT in the last 72 hours. Urine analysis:    Component Value Date/Time   COLORURINE YELLOW 07/22/2017 2053   APPEARANCEUR CLOUDY (A) 07/22/2017 2053   LABSPEC 1.012 07/22/2017 2053   PHURINE 5.0 07/22/2017 2053   GLUCOSEU NEGATIVE 07/22/2017 2053   HGBUR SMALL (A) 07/22/2017 2053   BILIRUBINUR NEGATIVE 07/22/2017 2053   KETONESUR NEGATIVE 07/22/2017 2053   PROTEINUR 30 (A) 07/22/2017 2053   UROBILINOGEN 0.2 04/01/2014 1659   NITRITE NEGATIVE 07/22/2017 2053   LEUKOCYTESUR LARGE (A) 07/22/2017 2053   Sepsis Labs: @LABRCNTIP (procalcitonin:4,lacticidven:4)  ) Recent Results (from the past 240 hour(s))  Urine Culture     Status: Abnormal   Collection Time: 07/15/17  7:45 PM  Result Value Ref Range Status   Specimen Description URINE, CLEAN CATCH  Final   Special Requests   Final    NONE Performed at [facility], [place] [address], [place], [place] [address]    Culture >=100,000 COLONIES/mL KLEBSIELLA PNEUMONIAE (A)  Final   Report Status 07/18/2017 FINAL  Final   Organism ID, Bacteria KLEBSIELLA PNEUMONIAE (A)  Final      Susceptibility   Klebsiella pneumoniae - MIC*    AMPICILLIN >=32 RESISTANT Resistant     CEFAZOLIN <=4 SENSITIVE Sensitive     CEFTRIAXONE <=1 SENSITIVE Sensitive     CIPROFLOXACIN <=0.25 SENSITIVE Sensitive     GENTAMICIN <=1 SENSITIVE Sensitive     IMIPENEM <=0.25 SENSITIVE Sensitive     NITROFURANTOIN 64 INTERMEDIATE Intermediate     TRIMETH/SULFA <=20 SENSITIVE Sensitive      AMPICILLIN/SULBACTAM 4 SENSITIVE Sensitive     PIP/TAZO <=4 SENSITIVE Sensitive     Extended ESBL NEGATIVE Sensitive     * >=100,000 COLONIES/mL KLEBSIELLA PNEUMONIAE      Studies: US Renal  Result Date: 07/23/2017 CLINICAL DATA:  67 year old female with history of diabetes and acute renal failure. EXAM: RENAL / URINARY TRACT ULTRASOUND COMPLETE COMPARISON:  Ultrasound dated 03/24/2017 FINDINGS: Right Kidney: Length: 13.7 cm. There is mild atrophy and cortical thinning. No hydronephrosis or shadowing stone. Left Kidney: Length: 12.4 cm. Mild parenchymal atrophy and cortical thinning. No hydronephrosis or shadowing stone. Bladder: Appears normal for degree of bladder distention. IMPRESSION: Mild atrophy and  parenchymal thinning of the kidneys. No hydronephrosis or shadowing stone. Electronically Signed   By: Anner Crete M.D.   On: 07/23/2017 02:51    Scheduled Meds: . aspirin EC  81 mg Oral Daily  . carvedilol  12.5 mg Oral BID WC  . clonazePAM  1 mg Oral TID  . clopidogrel  75 mg Oral Daily  . feeding supplement (PRO-STAT SUGAR FREE 64)  30 mL Oral BID  . ferrous sulfate  325 mg Oral Q breakfast  . heparin injection (subcutaneous)  5,000 Units Subcutaneous Q8H  . insulin aspart  0-5 Units Subcutaneous QHS  . insulin aspart  0-9 Units Subcutaneous TID WC  . insulin NPH Human  40 Units Subcutaneous QAC supper  . insulin NPH Human  90 Units Subcutaneous QAC breakfast  . isosorbide dinitrate  10 mg Oral BID  . levothyroxine  50 mcg Oral QAC breakfast  . magnesium oxide  400 mg Oral Daily  . PARoxetine  25 mg Oral Daily  . PARoxetine  37.5 mg Oral Daily  . potassium chloride  10 mEq Oral Daily  . pregabalin  75 mg Oral BID  . rosuvastatin  5 mg Oral q1800    Continuous Infusions: . sodium chloride 50 mL/hr at 07/23/17 0653     LOS: 1 day     Kayleen Memos, MD [facility] Pager [phone]  If 7PM-7AM, please contact night-coverage [email or web address] Password  [facility] 07/23/2017, 7:23 AM

## 2017-07-23 NOTE — Progress Notes (Addendum)
Lyrica 75mg  (3 capsules) pulled by RN and one capsule bursted.  I entered a one time dose of 25mg  for replacement.  Unable to waste the bursted capsule in pyxis because Epic thinks 75mg  dose was just one capsule.  RN witnessed waste down the sink.   Carleigh Buccieri D. Mina Marble, PharmD, BCPS Pager:  [phone] 07/23/2017, 12:39 PM

## 2017-07-23 NOTE — ED Notes (Signed)
RN called MD to discuss lethargy, reports of headache, and falls of blood thinners. RN requested head CT. Orders placed.

## 2017-07-23 NOTE — Consult Note (Addendum)
Cardiology Consultation:   Patient ID: Susan [name]; [id number]; Mar 02, 1951   Admit date: 07/22/2017 Date of Consult: 07/23/2017  Primary Care Provider: Alycia Rossetti, MD Primary Cardiologist: Dr Donnamae Jude, NP 06/22/2017 Primary Electrophysiologist:  n/a   Patient Profile:   Susan [name] is a 67 y.o. female with a hx of RBBB, DM, Hypothyroid, diastolic heart failure, MI 2000 w/ BMS RCA, STEMI 2007 w/ BMS RCA, MV 01/2017 w/ no scar or ischemia and EF 68%, TIA, obesity, who is being seen today for the evaluation of elevated troponin and CP at the request of Dr Nevada Crane.  History of Present Illness:   Susan [name] was admitted 02/20-02/24/2019 with Hyperosmolar non-ketotic state in patient with type 2 diabetes mellitus, diarrhea, N&V after ABX therapy for oral infection, . Pt refused SNF>>high fall risk. CBG > 1000 on admit.   After d/c, pt w/ repeated falls>>came back to the ER on 02/27.  She was found to be hypotensive w/ SBP 70s>>IVF. Cr now 4.81, electrolytes ok. Anemic with H&H 9.5/29.7, a little lower than normal for her. Hx iron deficiency w/ iron 11 during last admission. Troponin was checked and was mildly elevated, peak 0.31. Cards asked to see.    She has had poor po intake. Pt states she has trouble swallowing solid foods, does ok with soft foods like mashed potatoes.  Does not like very many things, says she will drink diet ginger ale.  She denies chest pain at this time, but has not regularly.  She had an episode last p.m., but did not tell the nursing staff until this morning.  She states the chest pain is not exertional.  It is in the middle of her chest.  It is a pressure.  It reaches an 8/10.  Normally, she takes a sublingual nitroglycerin and symptoms resolved.  However, the episode last p.m. resolved without medications.  She exerts herself very little and is not able to stand on her own.  However, she has no history of exertional symptoms.  She denies SOB but seems  to work to talk and has LE edema.    Past Medical History:  Diagnosis Date  . Acute MI ([place]) 1999; 2007  . Anemia    hx  . Anginal pain ([place])   . Anxiety   . Arthritis    "generalized" (03/15/2014)  . CAD (coronary artery disease)    MI in 2000 - MI  2007 - treated bare metal stent (no nuclear since then as 9/11)  . Carotid artery disease ([place])   . CHF (congestive heart failure) ([place])   . Chronic diastolic heart failure ([facility])    a) ECHO (08/2013) EF 55-60% and RV function nl b) RHC (08/2013) RA 4, RV 30/5/7, PA 25/10 (16), PCWP 7, Fick CO/CI 6.3/2.7, PVR 1.5 WU, PA 61 and 66%  . Daily headache    "~ every other day; since I fell in June" (03/15/2014)  . Depression   . Dyslipidemia   . Exertional shortness of breath   . HTN (hypertension)   . Hypothyroidism   . Obesity   . Osteoarthritis   . Peripheral neuropathy   . PONV (postoperative nausea and vomiting)   . RBBB (right bundle branch block)    Old  . Renal insufficiency    DENIES  . Stroke [facility])    mini strokes  . Syncope    likely due to low blood sugar  . Tachycardia    Sinus tachycardia  . Type II diabetes  mellitus ([place])   . Urinary incontinence   . Venous insufficiency     Past Surgical History:  Procedure Laterality Date  . ABDOMINAL HYSTERECTOMY  1980's  . CATARACT EXTRACTION, BILATERAL Bilateral ?2013  . CORONARY ANGIOPLASTY WITH STENT PLACEMENT  1999; 2007   "1 + 1"  . KNEE ARTHROSCOPY Left 10/25/2006  . RIGHT HEART CATHETERIZATION N/A 09/22/2013   Procedure: RIGHT HEART CATH;  Surgeon: Jolaine Artist, MD;  Location: [facility] CATH LAB;  Service: Cardiovascular;  Laterality: N/A;  . SHOULDER ARTHROSCOPY W/ ROTATOR CUFF REPAIR Right 03/14/2014  . SHOULDER ARTHROSCOPY WITH OPEN ROTATOR CUFF REPAIR Right 03/14/2014   Procedure: RIGHT SHOULDER ARTHROSCOPY WITH BICEPS RELEASE, OPEN SUBSCAPULA REPAIR, OPEN SUPRASPINATUS REPAIR.;  Surgeon: Meredith Pel, MD;  Location: [facility];  Service: Orthopedics;   Laterality: Right;  . TUBAL LIGATION  1970's     Prior to Admission medications   Medication Sig  albuterol (PROVENTIL HFA;VENTOLIN HFA) 108 (90 BASE) MCG/ACT inhaler Inhale 2 puffs into the lungs every 6 (six) hours as needed for wheezing or shortness of breath. Reported on 10/30/2015  aspirin EC 81 MG tablet Take 81 mg by mouth daily.  carvedilol (COREG) 12.5 MG tablet Take 1 tablet (12.5 mg total) by mouth 2 (two) times daily with a meal.  cefUROXime (CEFTIN) 500 MG tablet Take 1 tablet (500 mg total) by mouth 2 (two) times daily with a meal.  cholecalciferol (VITAMIN D) 1000 units tablet Take 1,000 Units by mouth daily.  clonazePAM (KLONOPIN) 1 MG tablet TAKE 1 TABLET BY MOUTH THREE TIMES A DAY Patient taking differently: TAKE 1mg   TABLET BY MOUTH THREE TIMES A DAY  clopidogrel (PLAVIX) 75 MG tablet TAKE 1 TABLET (75 MG TOTAL) BY MOUTH DAILY WITH BREAKFAST.  ferrous sulfate 325 (65 FE) MG tablet Take 325 mg by mouth daily with breakfast. Reported on 08/21/2015  insulin NPH Human (HUMULIN N,NOVOLIN N) 100 UNIT/ML injection Inject 0.9 mLs (90 Units total) into the skin daily before breakfast. Inject 75 units in the morning and 6 units at bedtime Patient taking differently: Inject 90 Units into the skin daily before breakfast. 6 units at bedtime  insulin regular (NOVOLIN R,HUMULIN R) 250 units/2.11mL (100 units/mL) injection Inject 5-40 Units into the skin 3 (three) times daily before meals. Sliding scale  isosorbide dinitrate (ISORDIL) 10 MG tablet Take 1 tablet (10 mg total) by mouth 2 (two) times daily.  levothyroxine (SYNTHROID, LEVOTHROID) 50 MCG tablet Take 1 tablet (50 mcg total) by mouth daily before breakfast.  losartan (COZAAR) 50 MG tablet TAKE 1 TABLET BY MOUTH EVERY DAY Patient taking differently: TAKE 50 mg  TABLET BY MOUTH EVERY DAY  Magnesium Oxide 400 (240 Mg) MG TABS TAKE 1 TABLET BY MOUTH EVERY DAY Patient taking differently: TAKE 240 mg TABLET BY MOUTH EVERY DAY  metolazone  (ZAROXOLYN) 2.5 MG tablet Take 1 tablet (2.5 mg total) by mouth once a week.  nitroGLYCERIN (NITROSTAT) 0.4 MG SL tablet PLACE 1 TABLET (0.4 MG TOTAL) UNDER THE TONGUE EVERY 5 (FIVE) MINUTES AS NEEDED FOR CHEST PAIN.  PARoxetine (PAXIL-CR) 25 MG 24 hr tablet Take 25 mg by mouth daily. Take with 37.5 mg tablet daily (total 62.5 mg)  PARoxetine (PAXIL-CR) 37.5 MG 24 hr tablet Take 37.5 mg by mouth daily. Take with 25 mg tablet daily (total 62.5 mg)  potassium chloride (K-DUR) 10 MEQ tablet Take 1 tablet (10 mEq total) by mouth daily. Take with the water pill in the morning  pregabalin (LYRICA) 150 MG  capsule Take 1 capsule (150 mg total) by mouth 2 (two) times daily.  rosuvastatin (CRESTOR) 20 MG tablet Take 1 tablet (20 mg total) by mouth daily.  torsemide (DEMADEX) 20 MG tablet Take 5 tablets (100 mg total) by mouth 2 (two) times daily.  vitamin E (VITAMIN E) 1000 UNIT capsule Take 1,000 Units by mouth daily.  insulin NPH Human (HUMULIN N,NOVOLIN N) 100 UNIT/ML injection Inject 0.4 mLs (40 Units total) into the skin daily before supper. Patient not taking: Reported on 07/22/2017  metolazone (ZAROXOLYN) 2.5 MG tablet Take 1 tablet (2.5 mg total) by mouth once a week. Patient not taking: Reported on 07/15/2017  PARoxetine (PAXIL-CR) 25 MG 24 hr tablet TAKE 1 TABLET BY MOUTH EVERY DAY Patient not taking: Reported on 07/22/2017  PARoxetine (PAXIL-CR) 37.5 MG 24 hr tablet TAKE 1 TABLET BY MOUTH EVERY DAY Patient not taking: Reported on 07/22/2017    Inpatient Medications: Scheduled Meds: . aspirin EC  81 mg Oral Daily  . carvedilol  12.5 mg Oral BID WC  . clonazePAM  1 mg Oral TID  . clopidogrel  75 mg Oral Daily  . feeding supplement (PRO-STAT SUGAR FREE 64)  30 mL Oral BID  . ferrous sulfate  325 mg Oral Q breakfast  . heparin injection (subcutaneous)  5,000 Units Subcutaneous Q8H  . insulin aspart  0-5 Units Subcutaneous QHS  . insulin aspart  0-9 Units Subcutaneous TID WC  . insulin NPH  Human  6 Units Subcutaneous QAC supper  . insulin NPH Human  90 Units Subcutaneous QAC breakfast  . isosorbide dinitrate  10 mg Oral BID  . levothyroxine  50 mcg Oral QAC breakfast  . magnesium oxide  400 mg Oral Daily  . PARoxetine  25 mg Oral Daily  . PARoxetine  37.5 mg Oral Daily  . potassium chloride  10 mEq Oral Daily  . pregabalin  75 mg Oral BID  . rosuvastatin  5 mg Oral q1800   Continuous Infusions: . sodium chloride 50 mL/hr at 07/23/17 0653  . cefTRIAXone (ROCEPHIN)  IV Stopped (07/23/17 0841)   PRN Meds: acetaminophen **OR** acetaminophen, albuterol  Allergies:    Allergies  Allergen Reactions  . Codeine Nausea And Vomiting    Social History:   Social History   Socioeconomic History  . Marital status: Legally Separated    Spouse name: Not on file  . Number of children: 3  . Years of education: 12th  . Highest education level: Not on file  Social Needs  . Financial resource strain: Not on file  . Food insecurity - worry: Not on file  . Food insecurity - inability: Not on file  . Transportation needs - medical: Not on file  . Transportation needs - non-medical: Not on file  Occupational History    Employer: UNEMPLOYED  Tobacco Use  . Smoking status: Former Smoker    Packs/day: 3.00    Years: 32.00    Pack years: 96.00    Types: Cigarettes    Last attempt to quit: 10/24/1997    Years since quitting: 19.7  . Smokeless tobacco: Never Used  Substance and Sexual Activity  . Alcohol use: Yes    Comment: "might have 2-3 daiquiris in the summer"  . Drug use: No  . Sexual activity: No  Other Topics Concern  . Not on file  Social History Narrative   Pt lives at home with her spouse.   Caffeine Use- 3 sodas daily    Family History:  Family History  Problem Relation Age of Onset  . Heart attack Mother 51   Family Status:  Family Status  Relation Name Status  . Mother  Deceased at age 47  . Father  Deceased       unknown    ROS:  Please see the  history of present illness.  All other ROS reviewed and negative.     Physical Exam/Data:   Vitals:   07/23/17 0900 07/23/17 1000 07/23/17 1005 07/23/17 1100  BP: 129/64 136/64  (!) 145/60  Pulse: 83 79  79  Resp: 17 16  15   Temp:   98.3 F (36.8 C)   TempSrc:   Oral   SpO2: 97% 96%  98%  Weight:      Height:        Intake/Output Summary (Last 24 hours) at 07/23/2017 1158 Last data filed at 07/22/2017 1958 Gross per 24 hour  Intake 1000 ml  Output -  Net 1000 ml   Filed Weights   07/22/17 1644  Weight: 290 lb (131.5 kg)   Body mass index is 48.26 kg/m.  General:  Well nourished, well developed, in no acute distress HEENT: normal Lymph: no adenopathy Neck: no JVD seen but difficult to assess secondary to body habitus Endocrine:  No thryomegaly Vascular: No carotid bruits; upper extremity pulses 2+, without bruits, unable to palpate lower extremity pulses due to edema, capillary refill is within normal limits Cardiac:  normal S1, S2; RRR; soft murmur  Lungs: Decreased breath sounds bases bilaterally, no wheezing, rhonchi, basilar rales  Abd: soft, ++tender without guarding, no hepatomegaly  Ext: 2+ edema Musculoskeletal:  No deformities, BUE and BLE strength normal and equal Skin: warm and dry  Neuro:  CNs 2-12 intact, no focal abnormalities noted Psych: Possibly depressed affect   EKG:  The EKG was personally reviewed and demonstrates: Sinus rhythm, right bundle branch block is old, inferior Q waves are old Telemetry:  Telemetry was personally reviewed and demonstrates: Sinus rhythm  Relevant CV Studies:  ECHO: Ordered, EF previously 50-55% by echo 2017  CATH: 11/25/2005 ANGIOGRAPHIC DATA:  1.  Ventriculography was done using hand injection.  Overall ventricular      function appeared preserved, although we could not see specific wall      motion because of the limited hand injection.  2.  The left main is free of critical disease.  3.  The LAD has about 50%  segmental plaquing at the origin of the diagonal.      The diagonal itself has about 50% ostial narrowing and then a 70% to 80%      mid-narrowing and what is otherwise a relatively small vessel distally.  4.  The circumflex system has a small ramus intermedius with about 70%      ostial narrowing, but is a small caliber vessel.  The large marginal      branch has 60% to 70% proximal segmental plaquing across the sub branch.      The remainder of the circumflex is without critical disease.  5.  The right coronary artery has about 40% to 50% narrowing proximally,      then a diffuse area of mild in-stent re-narrowing of about 50% in the      previously placed stent. The vessel distal to this is totally occluded.      Downstream the vessel consisted of bifurcating posterior descending and      a moderate size posterolateral branch.  Following reperfusion therapy  and stenting with a 23 x 2.75 stent taken at 3 atmospheres, there was      reduction from 100% down to 0%, and restoration of complete TIMI-III      flow with good myocardial blush.  CONCLUSION:  1.  Insulin-dependent diabetes mellitus.  2.  Acute inferior wall myocardial infarction treated with primary      percutaneous coronary intervention with stenting using a non-drug-      eluting platform due to history of rectal bleeding on aspirin.  3.  Scattered three-vessel coronary artery disease.  PLAN/DISPOSITION:  The patient will be treated with aspirin and clopidogrel  Laboratory Data:  Chemistry Recent Labs  Lab 07/18/17 0401 07/22/17 1725 07/22/17 1745 August 04, 2017 0559  NA 133* 133* 135 134*  K 3.5 4.1 4.1 4.0  CL 100* 102 102 102  CO2 21* 17*  --  16*  GLUCOSE 258* 109* 103* 178*  BUN 49* 123* 122* 126*  CREATININE 2.60* 4.81* 4.80* 4.47*  CALCIUM 8.4* 7.7*  --  7.7*  GFRNONAA 18* 8*  --  9*  GFRAA 21* 10*  --  11*  ANIONGAP 12 14  --  16*    Lab Results  Component Value Date   ALT 22 08-04-2017   AST 12  (L) 2017/08/04   ALKPHOS 134 (H) 08-04-17   BILITOT 0.4 04-Aug-2017   Hematology Recent Labs  Lab 07/17/17 0811 07/22/17 1725 07/22/17 1745 04-Aug-2017 0559  WBC  --  10.9*  --  8.8  RBC 3.80* 4.11  --  3.81*  HGB  --  10.4* 10.9* 9.5*  HCT  --  32.0* 32.0* 29.7*  MCV  --  77.9*  --  78.0  MCH  --  25.3*  --  24.9*  MCHC  --  32.5  --  32.0  RDW  --  15.5  --  15.6*  PLT  --  299  --  269   Cardiac Enzymes Recent Labs  Lab 07/22/17 1738 August 04, 2017 0559 08-04-17 1003  TROPONINI 0.26* 0.17* 0.15*    Recent Labs  Lab 07/22/17 1743  TROPIPOC 0.31*    TSH:  Lab Results  Component Value Date   TSH 1.493 10/26/2015   Lipids: Lab Results  Component Value Date   CHOL 162 02/27/2017   HDL 45 (L) 02/27/2017   LDLCALC 200 (H) 11/14/2016   TRIG 304 (H) 02/27/2017   CHOLHDL 3.6 02/27/2017   HgbA1c: Lab Results  Component Value Date   HGBA1C 11.4 (H) 07/16/2017   Magnesium:  Magnesium  Date Value Ref Range Status  02/27/2017 1.6 1.5 - 2.5 mg/dL Final    Radiology/Studies:  US Renal  Result Date: 04-Aug-2017 CLINICAL DATA:  67 year old female with history of diabetes and acute renal failure. EXAM: RENAL / URINARY TRACT ULTRASOUND COMPLETE COMPARISON:  Ultrasound dated 03/24/2017 FINDINGS: Right Kidney: Length: 13.7 cm. There is mild atrophy and cortical thinning. No hydronephrosis or shadowing stone. Left Kidney: Length: 12.4 cm. Mild parenchymal atrophy and cortical thinning. No hydronephrosis or shadowing stone. Bladder: Appears normal for degree of bladder distention. IMPRESSION: Mild atrophy and parenchymal thinning of the kidneys. No hydronephrosis or shadowing stone. Electronically Signed   By: Anner Crete M.D.   On: 08-04-17 02:51    Assessment and Plan:   Principal Problem: 1.  ARF (acute renal failure) ([place]) - Per IM -Patient is intravascularly dry, but also has chronic volume overload from R heart failure. -Albumin is 2.4, she may be third spacing  for some  of the volume overload -She is getting IV fluids, need to continue this, but watch for increasing SOB.  Active Problems: 2.  Diabetes mellitus type II, uncontrolled ([place]) -A1c was 11 last week -Per IM  3.  Anemia -Iron was 11 last week -Likely anemia of chronic disease -Per IM  4.  Elevated troponin -Unclear significance in the setting of acute renal failure - CKMB not elevated -She has chest pain, but not consistent with exertion and cannot do invasive evaluation at this time because of her creatinine. - troponin levels are not consistent with acute vessel closure. -Myoview in 2018 was without scar or ischemia -Recommend medical therapy for CAD with aspirin, Plavix, beta-blocker as blood pressure and heart rate will tolerate and statin -Check lipids and LFTs in a.m., may need a higher dose of Crestor.  5. Dysphagia - will leave to IM, but may be contributing to her poor po intake.   For questions or updates, please contact [place] Please consult [email or web address] for contact info under Cardiology/STEMI.   Signed, Rosaria Ferries, PA-C  07/23/2017 11:58 AM  Patient seen and examined with the above-signed Advanced Practice Provider and/or Housestaff. I personally reviewed laboratory data, imaging studies and relevant notes. I independently examined the patient and formulated the important aspects of the plan. I have edited the note to reflect any of my changes or salient points. I have personally discussed the plan with the patient and/or family.  Susan [name] is a 67 y/o morbidly obese woman with diastolic HF, CAD and stage III-IV CKD. We have followed her closely in HF Clinic. She has struggled due to marked dietary non-compliance and high salt, calorie and fluid intake. She frequently takes excess doses of diuretics in an effort to push her weight down.   She now presents to ER with fatigue and falls and found to have severe AKI. Troponin minimally elevated (and flat).  She denies CP. No recent diarrhea, fevers or chills. Weight down almost 10 pounds over last few fays.   Echo done (Personally reviewed) Very difficult windows. LVEF 55% RV grossly normal.   She has AKI in setting of overdiuresis. Agree with gentle hydration and holding all diuretics. Check UA. She does have some residual LE edema but likely related to venous insufficiency and possibly RV strain. Mild troponin elevation is non-specific and does not require any further w/u.   We will follow as needed.    Glori Bickers, MD  11:47 PM

## 2017-07-23 NOTE — ED Notes (Signed)
Patient transported to Ultrasound 

## 2017-07-24 ENCOUNTER — Encounter (HOSPITAL_COMMUNITY): Payer: Self-pay | Admitting: Internal Medicine

## 2017-07-24 ENCOUNTER — Encounter (HOSPITAL_COMMUNITY)
Admission: EM | Disposition: A | Discharge status: Skilled Nursing Facility | Payer: Self-pay | Source: Home / Self Care | Attending: Internal Medicine

## 2017-07-24 DIAGNOSIS — I2721 Secondary pulmonary arterial hypertension: Secondary | ICD-10-CM

## 2017-07-24 DIAGNOSIS — I50811 Acute right heart failure: Secondary | ICD-10-CM

## 2017-07-24 DIAGNOSIS — R600 Localized edema: Secondary | ICD-10-CM

## 2017-07-24 HISTORY — PX: RIGHT HEART CATH: CATH118263

## 2017-07-24 LAB — BASIC METABOLIC PANEL
Anion gap: 14 (ref 5–15)
BUN: 121 mg/dL — ABNORMAL HIGH (ref 6–20)
CO2: 17 mmol/L — ABNORMAL LOW (ref 22–32)
Calcium: 7.9 mg/dL — ABNORMAL LOW (ref 8.9–10.3)
Chloride: 106 mmol/L (ref 101–111)
Creatinine, Ser: 3.17 mg/dL — ABNORMAL HIGH (ref 0.44–1.00)
GFR calc Af Amer: 16 mL/min — ABNORMAL LOW (ref >60)
GFR calc non Af Amer: 14 mL/min — ABNORMAL LOW (ref >60)
Glucose, Bld: 140 mg/dL — ABNORMAL HIGH (ref 65–99)
Potassium: 3.8 mmol/L (ref 3.5–5.1)
Sodium: 137 mmol/L (ref 135–145)

## 2017-07-24 LAB — LIPID PANEL
Cholesterol: 82 mg/dL (ref 0–200)
HDL: 24 mg/dL — ABNORMAL LOW (ref >40)
LDL Cholesterol: 25 mg/dL (ref 0–99)
Total CHOL/HDL Ratio: 3.4 RATIO
Triglycerides: 166 mg/dL — ABNORMAL HIGH (ref <150)
VLDL: 33 mg/dL (ref 0–40)

## 2017-07-24 LAB — POCT I-STAT 3, VENOUS BLOOD GAS (G3P V)
Acid-base deficit: 8 mmol/L — ABNORMAL HIGH (ref 0.0–2.0)
Acid-base deficit: 8 mmol/L — ABNORMAL HIGH (ref 0.0–2.0)
Bicarbonate: 18.2 mmol/L — ABNORMAL LOW (ref 20.0–28.0)
Bicarbonate: 18.5 mmol/L — ABNORMAL LOW (ref 20.0–28.0)
O2 Saturation: 62 %
O2 Saturation: 64 %
TCO2: 19 mmol/L — ABNORMAL LOW (ref 22–32)
TCO2: 20 mmol/L — ABNORMAL LOW (ref 22–32)
pCO2, Ven: 38.4 mmHg — ABNORMAL LOW (ref 44.0–60.0)
pCO2, Ven: 39.3 mmHg — ABNORMAL LOW (ref 44.0–60.0)
pH, Ven: 7.281 (ref 7.250–7.430)
pH, Ven: 7.283 (ref 7.250–7.430)
pO2, Ven: 36 mmHg (ref 32.0–45.0)
pO2, Ven: 37 mmHg (ref 32.0–45.0)

## 2017-07-24 LAB — CBC
HCT: 28.4 % — ABNORMAL LOW (ref 36.0–46.0)
Hemoglobin: 9 g/dL — ABNORMAL LOW (ref 12.0–15.0)
MCH: 24.9 pg — ABNORMAL LOW (ref 26.0–34.0)
MCHC: 31.7 g/dL (ref 30.0–36.0)
MCV: 78.5 fL (ref 78.0–100.0)
Platelets: 271 10*3/uL (ref 150–400)
RBC: 3.62 MIL/uL — ABNORMAL LOW (ref 3.87–5.11)
RDW: 15.9 % — ABNORMAL HIGH (ref 11.5–15.5)
WBC: 6.9 10*3/uL (ref 4.0–10.5)

## 2017-07-24 LAB — GLUCOSE, CAPILLARY
Glucose-Capillary: 128 mg/dL — ABNORMAL HIGH (ref 65–99)
Glucose-Capillary: 167 mg/dL — ABNORMAL HIGH (ref 65–99)
Glucose-Capillary: 156 mg/dL — ABNORMAL HIGH (ref 65–99)
Glucose-Capillary: 150 mg/dL — ABNORMAL HIGH (ref 65–99)

## 2017-07-24 LAB — HEPATIC FUNCTION PANEL
ALT: 17 U/L (ref 14–54)
AST: 10 U/L — ABNORMAL LOW (ref 15–41)
Albumin: 2.1 g/dL — ABNORMAL LOW (ref 3.5–5.0)
Alkaline Phosphatase: 129 U/L — ABNORMAL HIGH (ref 38–126)
Bilirubin, Direct: <0.1 mg/dL — ABNORMAL LOW (ref 0.1–0.5)
Total Bilirubin: 0.3 mg/dL (ref 0.3–1.2)
Total Protein: 5.4 g/dL — ABNORMAL LOW (ref 6.5–8.1)

## 2017-07-24 LAB — PROTIME-INR
INR: 1.15
Prothrombin Time: 14.6 seconds (ref 11.4–15.2)

## 2017-07-24 LAB — CALCIUM / CREATININE RATIO, URINE
Calcium, Ur: <0.8 mg/dL
Calcium/Creat.Ratio: <5 mg/g creat (ref 0–260)
Creatinine, Urine: 170.5 mg/dL

## 2017-07-24 SURGERY — RIGHT HEART CATH
Anesthesia: LOCAL

## 2017-07-24 MED ORDER — ASPIRIN 81 MG PO CHEW: 81.0000 mg | CHEWABLE_TABLET | ORAL | Status: DC

## 2017-07-24 MED ORDER — ONDANSETRON HCL 4 MG/2ML IJ SOLN: 4.0000 mg | Freq: Four times a day (QID) | INTRAMUSCULAR | Status: DC | PRN

## 2017-07-24 MED ORDER — SODIUM CHLORIDE 0.9 % IV SOLN: 250.0000 mL | INTRAVENOUS | Status: DC | PRN

## 2017-07-24 MED ORDER — ACETAMINOPHEN 325 MG PO TABS
650.0000 mg | ORAL_TABLET | ORAL | Status: DC | PRN
  Administered 2017-07-24: 650 mg via ORAL
  Filled 2017-07-24: qty 2

## 2017-07-24 MED ORDER — SODIUM CHLORIDE 0.9% FLUSH: 3.0000 mL | INTRAVENOUS | Status: DC | PRN

## 2017-07-24 MED ORDER — HEPARIN (PORCINE) IN NACL 2-0.9 UNIT/ML-% IJ SOLN
INTRAMUSCULAR | Status: AC
  Filled 2017-07-24: qty 500

## 2017-07-24 MED ORDER — SODIUM CHLORIDE 0.9% FLUSH: 3.0000 mL | Freq: Two times a day (BID) | INTRAVENOUS | Status: DC

## 2017-07-24 MED ORDER — SODIUM CHLORIDE 0.9% FLUSH
3.0000 mL | Freq: Two times a day (BID) | INTRAVENOUS | Status: DC
  Administered 2017-07-24 – 2017-07-27 (×6): 3 mL via INTRAVENOUS

## 2017-07-24 MED ORDER — SODIUM CHLORIDE 0.9% FLUSH
3.0000 mL | INTRAVENOUS | Status: DC | PRN
  Administered 2017-07-25: 3 mL via INTRAVENOUS
  Filled 2017-07-24: qty 3

## 2017-07-24 MED ORDER — SODIUM CHLORIDE 0.9 % IV SOLN: INTRAVENOUS | Status: DC

## 2017-07-24 MED ORDER — HEPARIN SODIUM (PORCINE) 5000 UNIT/ML IJ SOLN
5000.0000 [IU] | Freq: Three times a day (TID) | INTRAMUSCULAR | Status: DC
  Administered 2017-07-25 – 2017-07-28 (×11): 5000 [IU] via SUBCUTANEOUS
  Filled 2017-07-24 (×11): qty 1

## 2017-07-24 MED ORDER — LIDOCAINE HCL (PF) 1 % IJ SOLN
INTRAMUSCULAR | Status: DC | PRN
  Administered 2017-07-24: 2 mL

## 2017-07-24 MED ORDER — LIDOCAINE HCL 1 % IJ SOLN
INTRAMUSCULAR | Status: AC
  Filled 2017-07-24: qty 20

## 2017-07-24 SURGICAL SUPPLY — 7 items
CATH BALLN WEDGE 5F 110CM (CATHETERS) ×2 IMPLANT
GUIDEWIRE .025 260CM (WIRE) ×2 IMPLANT
HOVERMATT SINGLE USE (MISCELLANEOUS) ×2 IMPLANT
PACK CARDIAC CATHETERIZATION (CUSTOM PROCEDURE TRAY) ×2 IMPLANT
SHEATH GLIDE SLENDER 4/5FR (SHEATH) ×2 IMPLANT
TRANSDUCER W/STOPCOCK (MISCELLANEOUS) ×2 IMPLANT
TUBING CIL FLEX 10 FLL-RA (TUBING) ×2 IMPLANT

## 2017-07-24 NOTE — Evaluation (Signed)
Occupational Therapy Evaluation Patient Details Name: Susan [name] MRN: [id number] DOB: [birth year] Today's Date: 07/24/2017    History of Present Illness Pt is a 67 y/o female admitted on 07/22/17 due to falls and trouble swallowing. Pt was hospitalized 07/16/08 to 07/19/17 with hyperglycemia with uncontrolled DM. SNF was recommended for rehab, but pt refused.  PMH including but not limited to CHF, CAD status post bare-metal stent placement, hypothyroidism, chronic kidney disease and chronic anemia.   Clinical Impression   Pt was unable to care for herself and was bed bound in the span of time since her last admission. She currently presents with generalized weakness and poor activity tolerance. She needs 2 person assist for bed mobility and is unable to stand. Pt need post acute rehab in SNF prior to return home and is agreeable. Will follow acutely.    Follow Up Recommendations  SNF;Supervision/Assistance - 24 hour    Equipment Recommendations  (defer to next venue)    Recommendations for Other Services       Precautions / Restrictions Precautions Precautions: Fall Restrictions Weight Bearing Restrictions: No      Mobility Bed Mobility Overal bed mobility: Needs Assistance Bed Mobility: Supine to Sit;Sit to Supine     Supine to sit: +2 for physical assistance;Max assist Sit to supine: +2 for physical assistance;Max assist   General bed mobility comments: assist for LEs, trunk and to position hips at EOB with bed pad  Transfers Overall transfer level: Needs assistance Equipment used: Rolling walker (2 wheeled) Transfers: Sit to/from Stand Sit to Stand: +2 physical assistance;Max assist         General transfer comment: attempted from elevated bed with assist of gait belt and bed pad, but pt unable to stand fully upright, fatigues easily    Balance Overall balance assessment: Needs assistance   Sitting balance-Leahy Scale: Fair       Standing balance-Leahy Scale:  Zero                             ADL either performed or assessed with clinical judgement   ADL Overall ADL's : Needs assistance/impaired Eating/Feeding: Set up;Bed level   Grooming: Oral care;Sitting;Minimal assistance;Brushing hair;Moderate assistance Grooming Details (indicate cue type and reason): unable to reach back of her head to comb hair Upper Body Bathing: Moderate assistance;Sitting   Lower Body Bathing: Total assistance;Bed level   Upper Body Dressing : Minimal assistance;Sitting   Lower Body Dressing: Total assistance;Bed level       Toileting- Clothing Manipulation and Hygiene: Total assistance;Bed level         General ADL Comments: pt unable to ambulate     Vision Patient Visual Report: No change from baseline Additional Comments: reports blurriness with her blood sugar is elevated     Perception     Praxis      Pertinent Vitals/Pain Pain Assessment: Faces Faces Pain Scale: Hurts a little bit Pain Location: L knee Pain Descriptors / Indicators: Sore Pain Intervention(s): Monitored during session;Repositioned     Hand Dominance Right   Extremity/Trunk Assessment Upper Extremity Assessment Upper Extremity Assessment: RUE deficits/detail;LUE deficits/detail RUE Deficits / Details: longstanding shoulder limitation, generalized weakness throughout, mild tremor RUE Coordination: decreased fine motor;decreased gross motor LUE Deficits / Details: generalized weakness, full AROM, moderate tremor   Lower Extremity Assessment Lower Extremity Assessment: Defer to PT evaluation   Cervical / Trunk Assessment Cervical / Trunk Assessment: Other exceptions(obesity)   Communication Communication  Communication: No difficulties   Cognition Arousal/Alertness: Awake/alert Behavior During Therapy: WFL for tasks assessed/performed Overall Cognitive Status: Impaired/Different from baseline Area of Impairment: Safety/judgement;Memory;Problem  solving                     Memory: Decreased short-term memory   Safety/Judgement: Decreased awareness of safety;Decreased awareness of deficits   Problem Solving: Difficulty sequencing;Requires verbal cues     General Comments       Exercises     Shoulder Instructions      Home Living Family/patient expects to be discharged to:: Private residence Living Arrangements: Spouse/significant other;Children Available Help at Discharge: Family;Available PRN/intermittently(husband works) Type of Home: House Home Access: Level entry     Home Layout: One level     Bathroom Shower/Tub: Occupational psychologist: Standard     Home Equipment: Environmental consultant - 2 wheels;Walker - 4 wheels      Lives With: Spouse    Prior Functioning/Environment Level of Independence: Needs assistance  Gait / Transfers Assistance Needed: pt was not able to ambulate in the brief time she was at home since her last hospitalization ADL's / Homemaking Assistance Needed: Pt reports staying in the same clothes and having her son bring her food prior to readmission. She was not able to recall how she was managing toileting.   Comments: Pt typically ambulates with a RW and can perform self care.        OT Problem List: Decreased strength;Decreased activity tolerance;Impaired balance (sitting and/or standing);Decreased range of motion;Decreased cognition;Decreased safety awareness;Decreased knowledge of use of DME or AE;Obesity;Impaired UE functional use;Pain      OT Treatment/Interventions: Self-care/ADL training;DME and/or AE instruction;Therapeutic activities;Patient/family education;Cognitive remediation/compensation    OT Goals(Current goals can be found in the care plan section) Acute Rehab OT Goals Patient Stated Goal: to get stronger OT Goal Formulation: With patient Time For Goal Achievement: 08/07/17 Potential to Achieve Goals: Fair ADL Goals Pt Will Perform Grooming: with  supervision;sitting Pt Will Perform Upper Body Bathing: sitting;with min assist Pt Will Perform Upper Body Dressing: with supervision;sitting Pt Will Transfer to Toilet: with +2 assist;with min assist;bedside commode Additional ADL Goal #1: Pt will perform bed mobility with moderate assistance in preparation for ADL.  OT Frequency: Min 2X/week   Barriers to D/C: Decreased caregiver support          Co-evaluation PT/OT/SLP Co-Evaluation/Treatment: Yes Reason for Co-Treatment: For patient/therapist safety   OT goals addressed during session: ADL's and self-care      AM-PAC PT "6 Clicks" Daily Activity     Outcome Measure Help from another person eating meals?: None Help from another person taking care of personal grooming?: A Lot Help from another person toileting, which includes using toliet, bedpan, or urinal?: Total Help from another person bathing (including washing, rinsing, drying)?: A Lot Help from another person to put on and taking off regular upper body clothing?: A Lot Help from another person to put on and taking off regular lower body clothing?: Total 6 Click Score: 12   End of Session Equipment Utilized During Treatment: Gait belt;Rolling walker Nurse Communication: Other (comment);Mobility status(pt is NPO)  Activity Tolerance: Patient limited by fatigue Patient left: in bed;with call bell/phone within reach;with chair alarm set  OT Visit Diagnosis: History of falling (Z91.81);Muscle weakness (generalized) (M62.81);Pain;Other symptoms and signs involving cognitive function                Time: 1700-1749 OT Time Calculation (min): 31 min Charges:  OT General Charges $OT Visit: 1 Visit OT Evaluation $OT Eval Moderate Complexity: 1 Mod G-Codes:     06-Aug-2017 Nestor Lewandowsky, OTR/L Pager: [phone]  Werner Lean Haze Boyden 08/06/2017, 11:19 AM

## 2017-07-24 NOTE — Evaluation (Signed)
Physical Therapy Evaluation Patient Details Name: Susan [name] MRN: [id number] DOB: [birth year] Today's Date: 07/24/2017   History of Present Illness  Pt is a 67 y/o female admitted on 07/22/17 due to falls and trouble swallowing. Pt was hospitalized 07/16/08 to 07/19/17 with hyperglycemia with uncontrolled DM. SNF was recommended for rehab, but pt refused.  PMH including but not limited to CHF, CAD status post bare-metal stent placement, hypothyroidism, chronic kidney disease and chronic anemia.    Clinical Impression  Pt presented supine in bed with HOB elevated, awake and willing to participate in therapy session. Prior to admission, pt reported that in the week that she has been home since her previous admission she has been unable to ambulate. She stated that she has remained in the same clothes, in bed and has had her son bring her food/drink. Pt currently very limited secondary to generalized weakness and deconditioning. She requires max A x2 for bed mobility and transfers. Discussed the importance of post acute rehab at a SNF prior to returning home as pt refused last admission; hopeful she will be agreeable this time. Pt would continue to benefit from skilled physical therapy services at this time while admitted and after d/c to address the below listed limitations in order to improve overall safety and independence with functional mobility.     Follow Up Recommendations SNF;Supervision/Assistance - 24 hour    Equipment Recommendations  None recommended by PT;Other (comment)(defer to next venue)    Recommendations for Other Services       Precautions / Restrictions Precautions Precautions: Fall Restrictions Weight Bearing Restrictions: No      Mobility  Bed Mobility Overal bed mobility: Needs Assistance Bed Mobility: Supine to Sit;Sit to Supine     Supine to sit: +2 for physical assistance;Max assist Sit to supine: +2 for physical assistance;Max assist   General bed mobility  comments: assist for LEs, trunk and to position hips at EOB with bed pad  Transfers Overall transfer level: Needs assistance Equipment used: Rolling walker (2 wheeled) Transfers: Sit to/from Stand Sit to Stand: +2 physical assistance;Max assist         General transfer comment: attempted to achieve standing from elevated bed position with max A x2 and RW; however, pt only able to partially stand (unable to achieve full upright trunk position)  Ambulation/Gait             General Gait Details: unable  Stairs            Wheelchair Mobility    Modified Rankin (Stroke Patients Only)       Balance Overall balance assessment: Needs assistance Sitting-balance support: Feet supported Sitting balance-Leahy Scale: Fair     Standing balance support: During functional activity;Bilateral upper extremity supported Standing balance-Leahy Scale: Poor                               Pertinent Vitals/Pain Pain Assessment: Faces Faces Pain Scale: Hurts little more Pain Location: L knee Pain Descriptors / Indicators: Sore Pain Intervention(s): Monitored during session;Repositioned    Home Living Family/patient expects to be discharged to:: Private residence Living Arrangements: Spouse/significant other;Children Available Help at Discharge: Family;Available PRN/intermittently(husband works) Type of Home: House Home Access: Level entry     Home Layout: One level Home Equipment: Environmental consultant - 2 wheels;Walker - 4 wheels      Prior Function Level of Independence: Needs assistance   Gait / Transfers Assistance Needed: pt was  not able to ambulate in the brief time she was at home since her last hospitalization  ADL's / Homemaking Assistance Needed: Pt reports staying in the same clothes and having her son bring her food prior to readmission. She was not able to recall how she was managing toileting.  Comments: Pt typically ambulates with a RW and can perform self  care.     Hand Dominance   Dominant Hand: Right    Extremity/Trunk Assessment   Upper Extremity Assessment Upper Extremity Assessment: Defer to OT evaluation RUE Deficits / Details: longstanding shoulder limitation, generalized weakness throughout, mild tremor RUE Coordination: decreased fine motor;decreased gross motor LUE Deficits / Details: generalized weakness, full AROM, moderate tremor    Lower Extremity Assessment Lower Extremity Assessment: Generalized weakness    Cervical / Trunk Assessment Cervical / Trunk Assessment: Other exceptions(obesity)  Communication   Communication: No difficulties  Cognition Arousal/Alertness: Awake/alert Behavior During Therapy: WFL for tasks assessed/performed Overall Cognitive Status: Impaired/Different from baseline Area of Impairment: Safety/judgement;Memory;Problem solving                     Memory: Decreased short-term memory   Safety/Judgement: Decreased awareness of safety;Decreased awareness of deficits   Problem Solving: Difficulty sequencing;Requires verbal cues        General Comments      Exercises     Assessment/Plan    PT Assessment Patient needs continued PT services  PT Problem List Decreased strength;Decreased activity tolerance;Decreased balance;Decreased mobility;Decreased coordination;Decreased cognition;Decreased knowledge of use of DME;Decreased safety awareness;Decreased knowledge of precautions       PT Treatment Interventions DME instruction;Gait training;Stair training;Functional mobility training;Therapeutic activities;Therapeutic exercise;Balance training;Neuromuscular re-education;Cognitive remediation;Patient/family education    PT Goals (Current goals can be found in the Care Plan section)  Acute Rehab PT Goals Patient Stated Goal: to get stronger PT Goal Formulation: With patient Time For Goal Achievement: 08/07/17 Potential to Achieve Goals: Fair    Frequency Min 2X/week    Barriers to discharge Decreased caregiver support      Co-evaluation PT/OT/SLP Co-Evaluation/Treatment: Yes Reason for Co-Treatment: To address functional/ADL transfers;For patient/therapist safety PT goals addressed during session: Mobility/safety with mobility;Balance;Proper use of DME;Strengthening/ROM OT goals addressed during session: ADL's and self-care       AM-PAC PT "6 Clicks" Daily Activity  Outcome Measure Difficulty turning over in bed (including adjusting bedclothes, sheets and blankets)?: Unable Difficulty moving from lying on back to sitting on the side of the bed? : Unable Difficulty sitting down on and standing up from a chair with arms (e.g., wheelchair, bedside commode, etc,.)?: Unable Help needed moving to and from a bed to chair (including a wheelchair)?: Total Help needed walking in hospital room?: Total Help needed climbing 3-5 steps with a railing? : Total 6 Click Score: 6    End of Session Equipment Utilized During Treatment: Gait belt Activity Tolerance: Patient limited by fatigue Patient left: in bed;with call bell/phone within reach;with bed alarm set Nurse Communication: Mobility status;Need for lift equipment PT Visit Diagnosis: Other abnormalities of gait and mobility (R26.89);Muscle weakness (generalized) (M62.81);Repeated falls (R29.6)    Time: 2130-8657 PT Time Calculation (min) (ACUTE ONLY): 31 min   Charges:   PT Evaluation $PT Eval Moderate Complexity: 1 Mod     PT G Codes:        [place], PT, DPT [place] 07/24/2017, 1:24 PM

## 2017-07-24 NOTE — Interval H&P Note (Signed)
History and Physical Interval Note:  07/24/2017 12:46 PM  Susan [name]  has presented today for surgery, with the diagnosis of diastolic HF The various methods of treatment have been discussed with the patient and family. After consideration of risks, benefits and other options for treatment, the patient has consented to  Procedure(s): RIGHT HEART CATH (N/A) as a surgical intervention .  The patient's history has been reviewed, patient examined, no change in status, stable for surgery.  I have reviewed the patient's chart and labs.  Questions were answered to the patient's satisfaction.     Alieu Finnigan

## 2017-07-24 NOTE — Progress Notes (Addendum)
Advanced Heart Failure Rounding Note  PCP-Cardiologist: Glori Bickers, MD   Subjective:    Feeling better this am. Legs remain very swollen. She has had unna boots in the past to good effect. She is willing to proceed with RHC this afternoon.    Creatinine 4.47 -> 3.17 with fluid overnight. Denies SOB or PND.   Objective:   Weight Range: 290 lb (131.5 kg) Body mass index is 48.26 kg/m.   Vital Signs:   Temp:  [98 F (36.7 C)-98.9 F (37.2 C)] 98 F (36.7 C) (03/01 0600) Pulse Rate:  [70-80] 70 (03/01 0600) Resp:  [12-20] 18 (03/01 0600) BP: (116-145)/(59-65) 128/60 (03/01 0600) SpO2:  [96 %-100 %] 100 % (03/01 0600) Last BM Date: 07/15/17  Weight change: Filed Weights   07/22/17 1644  Weight: 290 lb (131.5 kg)    Intake/Output:   Intake/Output Summary (Last 24 hours) at 07/24/2017 0915 Last data filed at 07/24/2017 0648 Gross per 24 hour  Intake 1322.5 ml  Output 1000 ml  Net 322.5 ml      Physical Exam    General:  NAD.  HEENT: Normal Neck: Supple. JVP difficult due to body habitus. Looks flat. Carotids 2+ bilat; no bruits. No lymphadenopathy or thyromegaly appreciated. Cor: PMI nondisplaced. Regular rate & rhythm. No rubs, gallops or murmurs. Lungs: Clear Abdomen: Obese Soft, nontender, nondistended. No hepatosplenomegaly. No bruits or masses. Good bowel sounds. Extremities: No cyanosis or clubbing. Chronic venous stasis changes. 2-3+ edema Neuro: Alert & orientedx3, cranial nerves grossly intact. moves all 4 extremities w/o difficulty. Affect pleasant  Telemetry   NSR, personally reviewed.   EKG    No new tracings.    Labs    CBC Recent Labs    07/22/17 1725 07/22/17 1745 07/23/17 0559  WBC 10.9*  --  8.8  NEUTROABS 9.4*  --   --   HGB 10.4* 10.9* 9.5*  HCT 32.0* 32.0* 29.7*  MCV 77.9*  --  78.0  PLT 299  --  073   Basic Metabolic Panel Recent Labs    07/23/17 0559 07/24/17 0528  NA 134* 137  K 4.0 3.8  CL 102 106  CO2 16*  17*  GLUCOSE 178* 140*  BUN 126* 121*  CREATININE 4.47* 3.17*  CALCIUM 7.7* 7.9*   Liver Function Tests Recent Labs    07/22/17 1725 07/23/17 0559  AST 16 12*  ALT 27 22  ALKPHOS 148* 134*  BILITOT 0.4 0.4  PROT 6.1* 5.8*  ALBUMIN 2.5* 2.4*   No results for input(s): LIPASE, AMYLASE in the last 72 hours. Cardiac Enzymes Recent Labs    07/22/17 2155 07/23/17 0559 07/23/17 1003 07/23/17 1521 07/23/17 1844  CKTOTAL 84 63  --   --   --   CKMB 5.2* 3.3  --   --   --   TROPONINI  --  0.17* 0.15* 0.12* 0.15*   BNP: BNP (last 3 results) Recent Labs    01/13/17 1456 02/02/17 1530 04/29/17 1109  BNP 139.5* 131.8* 96.3   ProBNP (last 3 results) No results for input(s): PROBNP in the last 8760 hours.  D-Dimer No results for input(s): DDIMER in the last 72 hours. Hemoglobin A1C No results for input(s): HGBA1C in the last 72 hours. Fasting Lipid Panel No results for input(s): CHOL, HDL, LDLCALC, TRIG, CHOLHDL, LDLDIRECT in the last 72 hours. Thyroid Function Tests No results for input(s): TSH, T4TOTAL, T3FREE, THYROIDAB in the last 72 hours.  Invalid input(s): FREET3  Other results:  Imaging   Ct Head Wo Contrast  Result Date: 07/23/2017 CLINICAL DATA:  Multiple falls, neck pain. EXAM: CT HEAD WITHOUT CONTRAST CT CERVICAL SPINE WITHOUT CONTRAST TECHNIQUE: Multidetector CT imaging of the head and cervical spine was performed following the standard protocol without intravenous contrast. Multiplanar CT image reconstructions of the cervical spine were also generated. COMPARISON:  CT scan of October 25, 2015. FINDINGS: CT HEAD FINDINGS Brain: Mild diffuse cortical atrophy is noted. 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 No mass effect or midline shift is noted. Ventricular size is within normal limits. There is no evidence of mass lesion, hemorrhage or acute infarction. Vascular: No hyperdense vessel or unexpected calcification. Skull: Normal. Negative for fracture or  focal lesion. Sinuses/Orbits: No acute finding. Other: None. CT CERVICAL SPINE FINDINGS Alignment: Normal. Skull base and vertebrae: No acute fracture. No primary bone lesion or focal pathologic process. Soft tissues and spinal canal: No prevertebral fluid or swelling. No visible canal hematoma. Disc levels: Moderate degenerative disc disease is noted at C5-6 and C6-7 with anterior osteophyte formation. Upper chest: Negative. Other: Degenerative changes are seen involving the posterior facet joints bilaterally. IMPRESSION: Mild diffuse cortical atrophy. Old right cerebellar infarction. No acute intracranial abnormality seen. Multilevel degenerative disc disease. No acute abnormality seen in the cervical spine. Electronically Signed   By: Marijo Conception, M.D.   On: 07/23/2017 12:13   Ct Cervical Spine Wo Contrast  Result Date: 07/23/2017 CLINICAL DATA:  Multiple falls, neck pain. EXAM: CT HEAD WITHOUT CONTRAST CT CERVICAL SPINE WITHOUT CONTRAST TECHNIQUE: Multidetector CT imaging of the head and cervical spine was performed following the standard protocol without intravenous contrast. Multiplanar CT image reconstructions of the cervical spine were also generated. COMPARISON:  CT scan of October 25, 2015. FINDINGS: CT HEAD FINDINGS Brain: Mild diffuse cortical atrophy is noted. 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 No mass effect or midline shift is noted. Ventricular size is within normal limits. There is no evidence of mass lesion, hemorrhage or acute infarction. Vascular: No hyperdense vessel or unexpected calcification. Skull: Normal. Negative for fracture or focal lesion. Sinuses/Orbits: No acute finding. Other: None. CT CERVICAL SPINE FINDINGS Alignment: Normal. Skull base and vertebrae: No acute fracture. No primary bone lesion or focal pathologic process. Soft tissues and spinal canal: No prevertebral fluid or swelling. No visible canal hematoma. Disc levels: Moderate degenerative disc disease is  noted at C5-6 and C6-7 with anterior osteophyte formation. Upper chest: Negative. Other: Degenerative changes are seen involving the posterior facet joints bilaterally. IMPRESSION: Mild diffuse cortical atrophy. Old right cerebellar infarction. No acute intracranial abnormality seen. Multilevel degenerative disc disease. No acute abnormality seen in the cervical spine. Electronically Signed   By: Marijo Conception, M.D.   On: 07/23/2017 12:13    Medications:    Scheduled Medications: . aspirin EC  81 mg Oral Daily  . carvedilol  12.5 mg Oral BID WC  . clonazePAM  1 mg Oral TID  . clopidogrel  75 mg Oral Daily  . feeding supplement (PRO-STAT SUGAR FREE 64)  30 mL Oral BID  . ferrous sulfate  325 mg Oral Q breakfast  . heparin injection (subcutaneous)  5,000 Units Subcutaneous Q8H  . insulin aspart  0-5 Units Subcutaneous QHS  . insulin aspart  0-9 Units Subcutaneous TID WC  . insulin NPH Human  6 Units Subcutaneous QAC supper  . insulin NPH Human  90 Units Subcutaneous QAC breakfast  . isosorbide dinitrate  10 mg Oral BID  .  levothyroxine  50 mcg Oral QAC breakfast  . magnesium oxide  400 mg Oral Daily  . PARoxetine  25 mg Oral Daily  . PARoxetine  37.5 mg Oral Daily  . potassium chloride  10 mEq Oral Daily  . pregabalin  75 mg Oral BID  . rosuvastatin  5 mg Oral q1800    Infusions: . cefTRIAXone (ROCEPHIN)  IV 2 g (07/24/17 0823)    PRN Medications: acetaminophen **OR** acetaminophen, albuterol  Patient Profile   Susan [name] is a 67 y.o. female with a hx ofRBBB, DM, Hypothyroid, diastolic heart failure, MI 2000 w/ BMS RCA, STEMI 2007 w/ BMS RCA, MV 01/2017 w/ no scar or ischemia and EF 68%, TIA, obesity, who is being seen today for the evaluation of elevated troponin and CP at the request of Dr Nevada Crane.  Admitted to [facility] 07/23/17 with CP, mild troponin elevation, and ARF.   Assessment/Plan   1. ARF on CKD IV - Creatinine much improved with IVF overnight.  - Despite ARF, Remains  edematous into her thighs. Plan for RHC this afternoon.  - Follows with Dr. Hollie Salk as outpatient.   2. Acute on chronic diastolic CHF, RV failure - Echo 07/23/17 LVEF 55%, RV grossly normal.  - Troponin peak at 0.15, likely demand ischemia.  - Despite AKI she has marked peripheral volume overload.  - Will plan for RHC this afternoon to measure filling and pulmonary pressures.  - No lasix for now. Hold IVF.  - Place Unna boots.   3. CAD s/p BMS 2007. - CP this mission though troponin flat as above.  - No room for LHC with ARF on CKD IV.   4. Obesity - Body mass index is 48.26 kg/m.  - Pt needs to lose significant amount of weight  5. Poorly controlled, DM - Per primary.   Plan for RHC this afternoon. Pt consented.   Medication concerns reviewed with patient and pharmacy team. Barriers identified: None at this time.   Length of Stay: 2  Shirley Friar, PA-C  07/24/2017, 9:15 AM  Advanced Heart Failure Team Pager [phone] (M-F; 7a - 4p)  Please contact [facility] for night-coverage after hours (4p -7a ) and weekends on [email or web address]  Patient seen and examined with the above-signed Advanced Practice Provider and/or Housestaff. I personally reviewed laboratory data, imaging studies and relevant notes. I independently examined the patient and formulated the important aspects of the plan. I have edited the note to reflect any of my changes or salient points. I have personally discussed the plan with the patient and/or family.  She is feeling much better. Renal function much improved with hydration. Suspect AKI due to overdiuresis as she frequently takes extra diuretics to being her weight down. That said still with peripheral edema.  Will perform RHC today to evaluate for PAH and R-sided failure vs local venous insufficiency. May have component of fatty liver as well. Place UNNA boots. Can stop IVF as she is taking good pos.   Glori Bickers, MD  11:51 AM

## 2017-07-24 NOTE — Progress Notes (Signed)
Orthopedic Tech Progress Note Patient Details:  Susan [name] 1951/01/09 [id number]  Ortho Devices Ortho Device/Splint Location: Bilateral unna boot Ortho Device/Splint Interventions: Application   Post Interventions Patient Tolerated: Well Instructions Provided: Care of device   [name] 07/24/2017, 5:35 PM

## 2017-07-24 NOTE — Progress Notes (Signed)
Ortho Tech called earlier for [name], called back saying will be there to apply them.

## 2017-07-24 NOTE — H&P (View-Only) (Signed)
Advanced Heart Failure Rounding Note  PCP-Cardiologist: Glori Bickers, MD   Subjective:    Feeling better this am. Legs remain very swollen. She has had unna boots in the past to good effect. She is willing to proceed with RHC this afternoon.    Creatinine 4.47 -> 3.17 with fluid overnight. Denies SOB or PND.   Objective:   Weight Range: 290 lb (131.5 kg) Body mass index is 48.26 kg/m.   Vital Signs:   Temp:  [98 F (36.7 C)-98.9 F (37.2 C)] 98 F (36.7 C) (03/01 0600) Pulse Rate:  [70-80] 70 (03/01 0600) Resp:  [12-20] 18 (03/01 0600) BP: (116-145)/(59-65) 128/60 (03/01 0600) SpO2:  [96 %-100 %] 100 % (03/01 0600) Last BM Date: 07/15/17  Weight change: Filed Weights   07/22/17 1644  Weight: 290 lb (131.5 kg)    Intake/Output:   Intake/Output Summary (Last 24 hours) at 07/24/2017 0915 Last data filed at 07/24/2017 0648 Gross per 24 hour  Intake 1322.5 ml  Output 1000 ml  Net 322.5 ml      Physical Exam    General:  NAD.  HEENT: Normal Neck: Supple. JVP difficult due to body habitus. Looks flat. Carotids 2+ bilat; no bruits. No lymphadenopathy or thyromegaly appreciated. Cor: PMI nondisplaced. Regular rate & rhythm. No rubs, gallops or murmurs. Lungs: Clear Abdomen: Obese Soft, nontender, nondistended. No hepatosplenomegaly. No bruits or masses. Good bowel sounds. Extremities: No cyanosis or clubbing. Chronic venous stasis changes. 2-3+ edema Neuro: Alert & orientedx3, cranial nerves grossly intact. moves all 4 extremities w/o difficulty. Affect pleasant  Telemetry   NSR, personally reviewed.   EKG    No new tracings.    Labs    CBC Recent Labs    07/22/17 1725 07/22/17 1745 07/23/17 0559  WBC 10.9*  --  8.8  NEUTROABS 9.4*  --   --   HGB 10.4* 10.9* 9.5*  HCT 32.0* 32.0* 29.7*  MCV 77.9*  --  78.0  PLT 299  --  086   Basic Metabolic Panel Recent Labs    07/23/17 0559 07/24/17 0528  NA 134* 137  K 4.0 3.8  CL 102 106  CO2 16*  17*  GLUCOSE 178* 140*  BUN 126* 121*  CREATININE 4.47* 3.17*  CALCIUM 7.7* 7.9*   Liver Function Tests Recent Labs    07/22/17 1725 07/23/17 0559  AST 16 12*  ALT 27 22  ALKPHOS 148* 134*  BILITOT 0.4 0.4  PROT 6.1* 5.8*  ALBUMIN 2.5* 2.4*   No results for input(s): LIPASE, AMYLASE in the last 72 hours. Cardiac Enzymes Recent Labs    07/22/17 2155 07/23/17 0559 07/23/17 1003 07/23/17 1521 07/23/17 1844  CKTOTAL 84 63  --   --   --   CKMB 5.2* 3.3  --   --   --   TROPONINI  --  0.17* 0.15* 0.12* 0.15*   BNP: BNP (last 3 results) Recent Labs    01/13/17 1456 02/02/17 1530 04/29/17 1109  BNP 139.5* 131.8* 96.3   ProBNP (last 3 results) No results for input(s): PROBNP in the last 8760 hours.  D-Dimer No results for input(s): DDIMER in the last 72 hours. Hemoglobin A1C No results for input(s): HGBA1C in the last 72 hours. Fasting Lipid Panel No results for input(s): CHOL, HDL, LDLCALC, TRIG, CHOLHDL, LDLDIRECT in the last 72 hours. Thyroid Function Tests No results for input(s): TSH, T4TOTAL, T3FREE, THYROIDAB in the last 72 hours.  Invalid input(s): FREET3  Other results:  Imaging   Ct Head Wo Contrast  Result Date: 07/23/2017 CLINICAL DATA:  Multiple falls, neck pain. EXAM: CT HEAD WITHOUT CONTRAST CT CERVICAL SPINE WITHOUT CONTRAST TECHNIQUE: Multidetector CT imaging of the head and cervical spine was performed following the standard protocol without intravenous contrast. Multiplanar CT image reconstructions of the cervical spine were also generated. COMPARISON:  CT scan of October 25, 2015. FINDINGS: CT HEAD FINDINGS Brain: Mild diffuse cortical atrophy is noted. 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 No mass effect or midline shift is noted. Ventricular size is within normal limits. There is no evidence of mass lesion, hemorrhage or acute infarction. Vascular: No hyperdense vessel or unexpected calcification. Skull: Normal. Negative for fracture or  focal lesion. Sinuses/Orbits: No acute finding. Other: None. CT CERVICAL SPINE FINDINGS Alignment: Normal. Skull base and vertebrae: No acute fracture. No primary bone lesion or focal pathologic process. Soft tissues and spinal canal: No prevertebral fluid or swelling. No visible canal hematoma. Disc levels: Moderate degenerative disc disease is noted at C5-6 and C6-7 with anterior osteophyte formation. Upper chest: Negative. Other: Degenerative changes are seen involving the posterior facet joints bilaterally. IMPRESSION: Mild diffuse cortical atrophy. Old right cerebellar infarction. No acute intracranial abnormality seen. Multilevel degenerative disc disease. No acute abnormality seen in the cervical spine. Electronically Signed   By: Marijo Conception, M.D.   On: 07/23/2017 12:13   Ct Cervical Spine Wo Contrast  Result Date: 07/23/2017 CLINICAL DATA:  Multiple falls, neck pain. EXAM: CT HEAD WITHOUT CONTRAST CT CERVICAL SPINE WITHOUT CONTRAST TECHNIQUE: Multidetector CT imaging of the head and cervical spine was performed following the standard protocol without intravenous contrast. Multiplanar CT image reconstructions of the cervical spine were also generated. COMPARISON:  CT scan of October 25, 2015. FINDINGS: CT HEAD FINDINGS Brain: Mild diffuse cortical atrophy is noted. 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 No mass effect or midline shift is noted. Ventricular size is within normal limits. There is no evidence of mass lesion, hemorrhage or acute infarction. Vascular: No hyperdense vessel or unexpected calcification. Skull: Normal. Negative for fracture or focal lesion. Sinuses/Orbits: No acute finding. Other: None. CT CERVICAL SPINE FINDINGS Alignment: Normal. Skull base and vertebrae: No acute fracture. No primary bone lesion or focal pathologic process. Soft tissues and spinal canal: No prevertebral fluid or swelling. No visible canal hematoma. Disc levels: Moderate degenerative disc disease is  noted at C5-6 and C6-7 with anterior osteophyte formation. Upper chest: Negative. Other: Degenerative changes are seen involving the posterior facet joints bilaterally. IMPRESSION: Mild diffuse cortical atrophy. Old right cerebellar infarction. No acute intracranial abnormality seen. Multilevel degenerative disc disease. No acute abnormality seen in the cervical spine. Electronically Signed   By: Marijo Conception, M.D.   On: 07/23/2017 12:13    Medications:    Scheduled Medications: . aspirin EC  81 mg Oral Daily  . carvedilol  12.5 mg Oral BID WC  . clonazePAM  1 mg Oral TID  . clopidogrel  75 mg Oral Daily  . feeding supplement (PRO-STAT SUGAR FREE 64)  30 mL Oral BID  . ferrous sulfate  325 mg Oral Q breakfast  . heparin injection (subcutaneous)  5,000 Units Subcutaneous Q8H  . insulin aspart  0-5 Units Subcutaneous QHS  . insulin aspart  0-9 Units Subcutaneous TID WC  . insulin NPH Human  6 Units Subcutaneous QAC supper  . insulin NPH Human  90 Units Subcutaneous QAC breakfast  . isosorbide dinitrate  10 mg Oral BID  .  levothyroxine  50 mcg Oral QAC breakfast  . magnesium oxide  400 mg Oral Daily  . PARoxetine  25 mg Oral Daily  . PARoxetine  37.5 mg Oral Daily  . potassium chloride  10 mEq Oral Daily  . pregabalin  75 mg Oral BID  . rosuvastatin  5 mg Oral q1800    Infusions: . cefTRIAXone (ROCEPHIN)  IV 2 g (07/24/17 0823)    PRN Medications: acetaminophen **OR** acetaminophen, albuterol  Patient Profile   Susan [name] is a 67 y.o. female with a hx ofRBBB, DM, Hypothyroid, diastolic heart failure, MI 2000 w/ BMS RCA, STEMI 2007 w/ BMS RCA, MV 01/2017 w/ no scar or ischemia and EF 68%, TIA, obesity, who is being seen today for the evaluation of elevated troponin and CP at the request of Dr [place] Crane.  Admitted to [facility] 07/23/17 with CP, mild troponin elevation, and ARF.   Assessment/Plan   1. ARF on CKD IV - Creatinine much improved with IVF overnight.  - Despite ARF, Remains  edematous into her thighs. Plan for RHC this afternoon.  - Follows with Dr. Hollie Salk as outpatient.   2. Acute on chronic diastolic CHF, RV failure - Echo 07/23/17 LVEF 55%, RV grossly normal.  - Troponin peak at 0.15, likely demand ischemia.  - Despite AKI she has marked peripheral volume overload.  - Will plan for RHC this afternoon to measure filling and pulmonary pressures.  - No lasix for now. Hold IVF.  - Place Unna boots.   3. CAD s/p BMS 2007. - CP this mission though troponin flat as above.  - No room for LHC with ARF on CKD IV.   4. Obesity - Body mass index is 48.26 kg/m.  - Pt needs to lose significant amount of weight  5. Poorly controlled, DM - Per primary.   Plan for RHC this afternoon. Pt consented.   Medication concerns reviewed with patient and pharmacy team. Barriers identified: None at this time.   Length of Stay: 2  Shirley Friar, PA-C  07/24/2017, 9:15 AM  Advanced Heart Failure Team Pager [phone] (M-F; 7a - 4p)  Please contact [facility] for night-coverage after hours (4p -7a ) and weekends on [email or web address]  Patient seen and examined with the above-signed Advanced Practice Provider and/or Housestaff. I personally reviewed laboratory data, imaging studies and relevant notes. I independently examined the patient and formulated the important aspects of the plan. I have edited the note to reflect any of my changes or salient points. I have personally discussed the plan with the patient and/or family.  She is feeling much better. Renal function much improved with hydration. Suspect AKI due to overdiuresis as she frequently takes extra diuretics to being her weight down. That said still with peripheral edema.  Will perform RHC today to evaluate for PAH and R-sided failure vs local venous insufficiency. May have component of fatty liver as well. Place UNNA boots. Can stop IVF as she is taking good pos.   Glori Bickers, MD  11:51 AM

## 2017-07-24 NOTE — Progress Notes (Signed)
Patient came back from Julian procedure, alert and oriented, Left AC site intact, no bleeding noted.  Will continue to monitor.

## 2017-07-24 NOTE — Progress Notes (Addendum)
PROGRESS NOTE  Susan [name] HUD:[id number] DOB: 18-Oct-1950 DOA: 07/22/2017 PCP: Alycia Rossetti, MD  HPI/Recap of past 24 hours: Susan [name]  is a 67 y.o. female, w dm2 w neuropathy, CKD stage 4, , hypertension, hyperlipidemia, CAD, CHF (EF ), CVA, apparently went home and had fall x2.  Pt was recommended for SNF but refused to go at discharge on  07/19/2017 Pt states represented due to continued falls and poor po intake since going home.   07/23/17: seen and examined in the ED. Somnolent but arousable to voices. Reports pain in her head and neck CT head and CT neck ordered, no acute abnormalities. 2D echo LVEF 85-88% grade 1 diastolic dysfunction.  07/24/17: patient seen and examined at her bedside. She is alert and oriented x 3 and more interactive. Denies any chest pain or dyspnea. Cardiology following with plan for right heart cath this afternoon.   Assessment/Plan: Principal Problem:   ARF (acute renal failure) (HCC) Active Problems:   Diabetes mellitus type II, uncontrolled (HCC)   Anemia   Elevated troponin   PAH (pulmonary artery hypertension) (HCC)  Acute metabolic encephalopathy, resolved  -suspect 2/2 to UTI, poa vs others -CT head/neck 07/23/17 negative for acute findings- has old right cerebellar infarction  -07/24/17 continue IV ceftriaxone day#1/5 -reorient as needed  UTI, poa -IV ceftriaxone day#1/5 -urine cx >100,000 klebsiella pneumoniae  Elevated troponin -trop 0.26 peaked -trended down to 0.12 -EKG no ST T elevation -cardiology following  Suspected pulmonary artery hypertension and right sided failure -cardiology following -plan for right heart cath today 07/24/17 -continue to monitor on telemetry  AKI on CKD IV, improving -hold diuretics and ARBs -continue to monitor urine output -avoid nephrotoxic agents, hypotension, dehydration -cr 3.17 from 4.47 from 4.80 baseline cr 2.6 -gentle IV fluid hydration overnight, stopped to avoid overload.  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 on CT head 07/23/17 -PT/OT when more stable -patient is now agreeable for SNF placement when medically stable for discharge -social worker consulted for placement  CAD -continue asa, coreg, crestor  DM2/diabetic polyneuropathy -ISS -A1C 11.4 (07/16/17) -lyrica  Chronic HFPEF 55-60% -hold diuretics and ARBs due to AKI -Defer to cardiology to restart meds  Depression -paxil  Morbid obesity -weight loss -outpatient follow up   Code Status: full  Family Communication: none at bedside   Disposition Plan: SNF when medically stable   Consultants:  cardiology  Procedures:  [place] planned today  Antimicrobials:  IV ceftriaxone day #1/5 for UTI  DVT prophylaxis:  SCDs, sq heparin 5000 u tid   Objective: Vitals:   07/24/17 1258 07/24/17 1303 07/24/17 1308 07/24/17 1312  BP: (!) 167/85 (!) 158/87 (!) 158/78 (!) 149/81  Pulse: 87 (!) 107 88 84  Resp: 16 16 13 14   Temp:      TempSrc:      SpO2: 96% 97% 97% 96%  Weight:      Height:        Intake/Output Summary (Last 24 hours) at 07/24/2017 1557 Last data filed at 07/24/2017 1115 Gross per 24 hour  Intake 1562.5 ml  Output 1400 ml  Net 162.5 ml   Filed Weights   07/22/17 1644  Weight: 131.5 kg (290 lb)    Exam: 07/24/17 patient seen and examined. Physical exam as stated below.   General:  67 yo morbidly obese CF alert and oriented x 3  Cardiovascular: RRR no rubs or gallops  Respiratory: CTA no  wheezes or rales   Abdomen: obese NBS  Musculoskeletal:does not participate well in her physical exam due to morbid obesity. Moves all 4 limbs.  Skin: LE chronic venous dermatitis  Psychiatry: mood is appropriate for condition and setting.  Data Reviewed: CBC: Recent Labs  Lab 07/22/17 1725 07/22/17 1745 07/23/17 0559 07/24/17 0528  WBC 10.9*  --  8.8 6.9  NEUTROABS 9.4*  --   --   --   HGB 10.4* 10.9*  9.5* 9.0*  HCT 32.0* 32.0* 29.7* 28.4*  MCV 77.9*  --  78.0 78.5  PLT 299  --  269 324   Basic Metabolic Panel: Recent Labs  Lab 07/18/17 0401 07/22/17 1725 07/22/17 1745 07/23/17 0559 07/24/17 0528  NA 133* 133* 135 134* 137  K 3.5 4.1 4.1 4.0 3.8  CL 100* 102 102 102 106  CO2 21* 17*  --  16* 17*  GLUCOSE 258* 109* 103* 178* 140*  BUN 49* 123* 122* 126* 121*  CREATININE 2.60* 4.81* 4.80* 4.47* 3.17*  CALCIUM 8.4* 7.7*  --  7.7* 7.9*   GFR: Estimated Creatinine Clearance: 23.3 mL/min (A) (by C-G formula based on SCr of 3.17 mg/dL (H)). Liver Function Tests: Recent Labs  Lab 07/22/17 1725 07/23/17 0559 07/24/17 0528  AST 16 12* 10*  ALT 27 22 17   ALKPHOS 148* 134* 129*  BILITOT 0.4 0.4 0.3  PROT 6.1* 5.8* 5.4*  ALBUMIN 2.5* 2.4* 2.1*   No results for input(s): LIPASE, AMYLASE in the last 168 hours. No results for input(s): AMMONIA in the last 168 hours. Coagulation Profile: Recent Labs  Lab 07/24/17 1133  INR 1.15   Cardiac Enzymes: Recent Labs  Lab 07/22/17 1738 07/22/17 2155 07/23/17 0559 07/23/17 1003 07/23/17 1521 07/23/17 1844  CKTOTAL  --  84 63  --   --   --   CKMB  --  5.2* 3.3  --   --   --   TROPONINI 0.26*  --  0.17* 0.15* 0.12* 0.15*   BNP (last 3 results) No results for input(s): PROBNP in the last 8760 hours. HbA1C: No results for input(s): HGBA1C in the last 72 hours. CBG: Recent Labs  Lab 07/23/17 1331 07/23/17 1659 07/23/17 2219 07/24/17 0746 07/24/17 1140  GLUCAP 106* 66 82 128* 167*   Lipid Profile: Recent Labs    07/24/17 0528  CHOL 82  HDL 24*  LDLCALC 25  TRIG 166*  CHOLHDL 3.4   Thyroid Function Tests: No results for input(s): TSH, T4TOTAL, FREET4, T3FREE, THYROIDAB in the last 72 hours. Anemia Panel: No results for input(s): VITAMINB12, FOLATE, FERRITIN, TIBC, IRON, RETICCTPCT in the last 72 hours. Urine analysis:    Component Value Date/Time   COLORURINE YELLOW 07/22/2017 2053   APPEARANCEUR CLOUDY (A)  07/22/2017 2053   LABSPEC 1.012 07/22/2017 2053   PHURINE 5.0 07/22/2017 2053   GLUCOSEU NEGATIVE 07/22/2017 2053   HGBUR SMALL (A) 07/22/2017 2053   BILIRUBINUR NEGATIVE 07/22/2017 2053   KETONESUR NEGATIVE 07/22/2017 2053   PROTEINUR 30 (A) 07/22/2017 2053   UROBILINOGEN 0.2 04/01/2014 1659   NITRITE NEGATIVE 07/22/2017 2053   LEUKOCYTESUR LARGE (A) 07/22/2017 2053   Sepsis Labs: @LABRCNTIP (procalcitonin:4,lacticidven:4)  ) Recent Results (from the past 240 hour(s))  Urine Culture     Status: Abnormal   Collection Time: 07/15/17  7:45 PM  Result Value Ref Range Status   Specimen Description URINE, CLEAN CATCH  Final   Special Requests   Final    NONE Performed at [facility]  Lab, [address], [place], [place] [address]    Culture >=100,000 COLONIES/mL KLEBSIELLA PNEUMONIAE (A)  Final   Report Status 07/18/2017 FINAL  Final   Organism ID, Bacteria KLEBSIELLA PNEUMONIAE (A)  Final      Susceptibility   Klebsiella pneumoniae - MIC*    AMPICILLIN >=32 RESISTANT Resistant     CEFAZOLIN <=4 SENSITIVE Sensitive     CEFTRIAXONE <=1 SENSITIVE Sensitive     CIPROFLOXACIN <=0.25 SENSITIVE Sensitive     GENTAMICIN <=1 SENSITIVE Sensitive     IMIPENEM <=0.25 SENSITIVE Sensitive     NITROFURANTOIN 64 INTERMEDIATE Intermediate     TRIMETH/SULFA <=20 SENSITIVE Sensitive     AMPICILLIN/SULBACTAM 4 SENSITIVE Sensitive     PIP/TAZO <=4 SENSITIVE Sensitive     Extended ESBL NEGATIVE Sensitive     * >=100,000 COLONIES/mL KLEBSIELLA PNEUMONIAE  Urine Culture     Status: Abnormal (Preliminary result)   Collection Time: 07/22/17  8:53 PM  Result Value Ref Range Status   Specimen Description URINE, CATHETERIZED  Final   Special Requests   Final    NONE Performed at [facility], [place] [address], [place], [place] [address]    Culture >=100,000 COLONIES/mL KLEBSIELLA PNEUMONIAE (A)  Final   Report Status PENDING  Incomplete  MRSA PCR Screening     Status: None   Collection  Time: 07/23/17  3:20 PM  Result Value Ref Range Status   MRSA by PCR NEGATIVE NEGATIVE Final    Comment:        The GeneXpert MRSA Assay (FDA approved for NASAL specimens only), is one component of a comprehensive MRSA colonization surveillance program. It is not intended to diagnose MRSA infection nor to guide or monitor treatment for MRSA infections. Performed at [facility], [place] [address], [place], [place] [id number]       Studies: No results found.  Scheduled Meds: . aspirin EC  81 mg Oral Daily  . carvedilol  12.5 mg Oral BID WC  . clonazePAM  1 mg Oral TID  . clopidogrel  75 mg Oral Daily  . feeding supplement (PRO-STAT SUGAR FREE 64)  30 mL Oral BID  . ferrous sulfate  325 mg Oral Q breakfast  . heparin injection (subcutaneous)  5,000 Units Subcutaneous Q8H  . heparin  5,000 Units Subcutaneous Q8H  . insulin aspart  0-5 Units Subcutaneous QHS  . insulin aspart  0-9 Units Subcutaneous TID WC  . insulin NPH Human  6 Units Subcutaneous QAC supper  . insulin NPH Human  90 Units Subcutaneous QAC breakfast  . isosorbide dinitrate  10 mg Oral BID  . levothyroxine  50 mcg Oral QAC breakfast  . magnesium oxide  400 mg Oral Daily  . PARoxetine  25 mg Oral Daily  . PARoxetine  37.5 mg Oral Daily  . potassium chloride  10 mEq Oral Daily  . pregabalin  75 mg Oral BID  . rosuvastatin  5 mg Oral q1800  . sodium chloride flush  3 mL Intravenous Q12H    Continuous Infusions: . sodium chloride    . cefTRIAXone (ROCEPHIN)  IV Stopped (07/24/17 1000)     LOS: 2 days     Kayleen Memos, MD [facility] Pager [phone]  If 7PM-7AM, please contact night-coverage [email or web address] Password [facility] 07/24/2017, 3:57 PM

## 2017-07-25 DIAGNOSIS — N179 Acute kidney failure, unspecified: Secondary | ICD-10-CM

## 2017-07-25 LAB — GLUCOSE, CAPILLARY
Glucose-Capillary: 151 mg/dL — ABNORMAL HIGH (ref 65–99)
Glucose-Capillary: 189 mg/dL — ABNORMAL HIGH (ref 65–99)
Glucose-Capillary: 117 mg/dL — ABNORMAL HIGH (ref 65–99)
Glucose-Capillary: 51 mg/dL — ABNORMAL LOW (ref 65–99)
Glucose-Capillary: 95 mg/dL (ref 65–99)

## 2017-07-25 LAB — BLOOD GAS, ARTERIAL
Acid-base deficit: 4.1 mmol/L — ABNORMAL HIGH (ref 0.0–2.0)
Bicarbonate: 20.6 mmol/L (ref 20.0–28.0)
Drawn by: 244851
O2 Saturation: 95.3 %
Patient temperature: 98.6
pCO2 arterial: 38.6 mmHg (ref 32.0–48.0)
pH, Arterial: 7.347 — ABNORMAL LOW (ref 7.350–7.450)
pO2, Arterial: 80.7 mmHg — ABNORMAL LOW (ref 83.0–108.0)

## 2017-07-25 LAB — BASIC METABOLIC PANEL
Anion gap: 9 (ref 5–15)
BUN: 102 mg/dL — ABNORMAL HIGH (ref 6–20)
CO2: 21 mmol/L — ABNORMAL LOW (ref 22–32)
Calcium: 8.4 mg/dL — ABNORMAL LOW (ref 8.9–10.3)
Chloride: 109 mmol/L (ref 101–111)
Creatinine, Ser: 2.38 mg/dL — ABNORMAL HIGH (ref 0.44–1.00)
GFR calc Af Amer: 23 mL/min — ABNORMAL LOW (ref >60)
GFR calc non Af Amer: 20 mL/min — ABNORMAL LOW (ref >60)
Glucose, Bld: 149 mg/dL — ABNORMAL HIGH (ref 65–99)
Potassium: 3.9 mmol/L (ref 3.5–5.1)
Sodium: 139 mmol/L (ref 135–145)

## 2017-07-25 LAB — URINE CULTURE: Culture: >=100000 — AB

## 2017-07-25 MED ORDER — CEPHALEXIN 500 MG PO CAPS
500.0000 mg | ORAL_CAPSULE | Freq: Two times a day (BID) | ORAL | Status: DC
  Administered 2017-07-25 – 2017-07-26 (×4): 500 mg via ORAL
  Filled 2017-07-25 (×4): qty 1

## 2017-07-25 MED ORDER — TORSEMIDE 20 MG PO TABS
40.0000 mg | ORAL_TABLET | Freq: Two times a day (BID) | ORAL | Status: DC
  Administered 2017-07-25 – 2017-07-27 (×4): 40 mg via ORAL
  Filled 2017-07-25 (×4): qty 2

## 2017-07-25 NOTE — Progress Notes (Signed)
PROGRESS NOTE    Susan [name]  BOF:[id number] DOB: [birth year] DOA: 07/22/2017 PCP: Alycia Rossetti, MD     Brief Narrative:  Susan [name] is a 67 yo female with past medical history significant for DM type 2 with neuropathy, CKD stage 4, hypertension, HLD, CAD, CHF, CVA who was recently in the hospital for hyperosmolar non-ketotic state, Klebsiella UTI. At time of discharge, she refused SNF and went home. She now returns with continued falls and poor PO intake at home. She was admitted for AKI, acute metabolic encephalopathy as well as elevated troponin. Advanced heart failure team was consulted and patient underwent RHC 3/1.   Assessment & Plan:   Principal Problem:   ARF (acute renal failure) (HCC) Active Problems:   Diabetes mellitus type II, uncontrolled (HCC)   Anemia   Elevated troponin   PAH (pulmonary artery hypertension) (HCC)   AKI on CKD stage 4 -Follows with Dr. Hollie Salk as outpatient  -Baseline Cr 2.2 -Improving and near baseline now. IVF stopped, patient with good PO intake   Acute metabolic encephalopathy  -Resolved  Acute on chronic diastolic HF, RV failure   -EF 55% -Advanced heart failure team following  Recurrent falls -PT recommending SNF, patient more agreeable this time. SW consulted   Klebsiella UTI, POA -Sensitive to cephalosporins. Deescalate to keflex   DM type 2, with hyperglycemia -Ha1c 11.4 on 07/16/17  -NPH and SSI   Elevated troponin -Troponin peak at 0.26  -Advanced heart failure team following -S/p right heart cath 3/1   CAD  -Continue aspirin, plavix, coreg, isordil, crestor   Anemia of chronic disease -Baseline Hgb 9-10 -Continue iron supplement -Stable   Hypothyroidism -Continue synthroid   Depression -Continue paxil  Morbid obesity -Body mass index is 48.13 kg/m. -Encourage weight loss   DVT prophylaxis: subq hep  Code Status: Full Family Communication: No family at bedside Disposition Plan: SNF when  stable   Consultants:   Advance heart failure  Procedures:   Right heart cath 3/1  Antimicrobials:  Anti-infectives (From admission, onward)   Start     Dose/Rate Route Frequency Ordered Stop   07/25/17 1200  cephALEXin (KEFLEX) capsule 500 mg     500 mg Oral Every 6 hours 07/25/17 1049     07/23/17 0800  cefUROXime (CEFTIN) tablet 500 mg  Status:  Discontinued     500 mg Oral 2 times daily with meals 07/23/17 0208 07/23/17 0621   07/23/17 0800  cefTRIAXone (ROCEPHIN) 2 g in sodium chloride 0.9 % 100 mL IVPB  Status:  Discontinued     2 g 200 mL/hr over 30 Minutes Intravenous Every 24 hours 07/23/17 0728 07/25/17 1049       Subjective: No new issues. No chest pain or shortness of breath, continues to have peripheral edema   Objective: Vitals:   07/24/17 1654 07/24/17 2151 07/25/17 0641 07/25/17 0944  BP: (!) 143/56 138/69 125/76 (!) 124/57  Pulse: 79 79 83 67  Resp: 16 19 20 18   Temp: 98.6 F (37 C) 98.5 F (36.9 C) 98.9 F (37.2 C) 97.7 F (36.5 C)  TempSrc: Oral Oral Oral Oral  SpO2: 98% 97% 98% 94%  Weight:  131.2 kg (289 lb 3.9 oz)    Height:        Intake/Output Summary (Last 24 hours) at 07/25/2017 1052 Last data filed at 07/25/2017 0900 Gross per 24 hour  Intake 243 ml  Output 950 ml  Net -707 ml   Filed Weights   07/22/17 1644  07/24/17 2151  Weight: 131.5 kg (290 lb) 131.2 kg (289 lb 3.9 oz)    Examination:  General exam: Appears calm and comfortable  Respiratory system: Clear to auscultation. Respiratory effort normal. Cardiovascular system: S1 & S2 heard, RRR. No JVD, murmurs, rubs, gallops or clicks. +1 pedal edema. Gastrointestinal system: Abdomen is nondistended, soft and nontender. No organomegaly or masses felt. Normal bowel sounds heard. Central nervous system: Alert and oriented. No focal neurological deficits. Extremities: Symmetric 5 x 5 power. Skin: No rashes, lesions or ulcers Psychiatry: Judgement and insight appear normal. Mood &  affect appropriate.   Data Reviewed: I have personally reviewed following labs and imaging studies  CBC: Recent Labs  Lab 07/22/17 1725 07/22/17 1745 07/23/17 0559 07/24/17 0528  WBC 10.9*  --  8.8 6.9  NEUTROABS 9.4*  --   --   --   HGB 10.4* 10.9* 9.5* 9.0*  HCT 32.0* 32.0* 29.7* 28.4*  MCV 77.9*  --  78.0 78.5  PLT 299  --  269 626   Basic Metabolic Panel: Recent Labs  Lab 07/22/17 1725 07/22/17 1745 07/23/17 0559 07/24/17 0528 07/25/17 0533  NA 133* 135 134* 137 139  K 4.1 4.1 4.0 3.8 3.9  CL 102 102 102 106 109  CO2 17*  --  16* 17* 21*  GLUCOSE 109* 103* 178* 140* 149*  BUN 123* 122* 126* 121* 102*  CREATININE 4.81* 4.80* 4.47* 3.17* 2.38*  CALCIUM 7.7*  --  7.7* 7.9* 8.4*   GFR: Estimated Creatinine Clearance: 31 mL/min (A) (by C-G formula based on SCr of 2.38 mg/dL (H)). Liver Function Tests: Recent Labs  Lab 07/22/17 1725 07/23/17 0559 07/24/17 0528  AST 16 12* 10*  ALT 27 22 17   ALKPHOS 148* 134* 129*  BILITOT 0.4 0.4 0.3  PROT 6.1* 5.8* 5.4*  ALBUMIN 2.5* 2.4* 2.1*   No results for input(s): LIPASE, AMYLASE in the last 168 hours. No results for input(s): AMMONIA in the last 168 hours. Coagulation Profile: Recent Labs  Lab 07/24/17 1133  INR 1.15   Cardiac Enzymes: Recent Labs  Lab 07/22/17 1738 07/22/17 2155 07/23/17 0559 07/23/17 1003 07/23/17 1521 07/23/17 1844  CKTOTAL  --  84 63  --   --   --   CKMB  --  5.2* 3.3  --   --   --   TROPONINI 0.26*  --  0.17* 0.15* 0.12* 0.15*   BNP (last 3 results) No results for input(s): PROBNP in the last 8760 hours. HbA1C: No results for input(s): HGBA1C in the last 72 hours. CBG: Recent Labs  Lab 07/24/17 0746 07/24/17 1140 07/24/17 1650 07/24/17 2129 07/25/17 0759  GLUCAP 128* 167* 156* 150* 151*   Lipid Profile: Recent Labs    07/24/17 0528  CHOL 82  HDL 24*  LDLCALC 25  TRIG 166*  CHOLHDL 3.4   Thyroid Function Tests: No results for input(s): TSH, T4TOTAL, FREET4,  T3FREE, THYROIDAB in the last 72 hours. Anemia Panel: No results for input(s): VITAMINB12, FOLATE, FERRITIN, TIBC, IRON, RETICCTPCT in the last 72 hours. Sepsis Labs: Recent Labs  Lab 07/22/17 1802  LATICACIDVEN 0.95    Recent Results (from the past 240 hour(s))  Urine Culture     Status: Abnormal   Collection Time: 07/15/17  7:45 PM  Result Value Ref Range Status   Specimen Description URINE, CLEAN CATCH  Final   Special Requests   Final    NONE Performed at [facility], [place],  [place] [address]    Culture >=100,000 COLONIES/mL KLEBSIELLA PNEUMONIAE (A)  Final   Report Status 07/18/2017 FINAL  Final   Organism ID, Bacteria KLEBSIELLA PNEUMONIAE (A)  Final      Susceptibility   Klebsiella pneumoniae - MIC*    AMPICILLIN >=32 RESISTANT Resistant     CEFAZOLIN <=4 SENSITIVE Sensitive     CEFTRIAXONE <=1 SENSITIVE Sensitive     CIPROFLOXACIN <=0.25 SENSITIVE Sensitive     GENTAMICIN <=1 SENSITIVE Sensitive     IMIPENEM <=0.25 SENSITIVE Sensitive     NITROFURANTOIN 64 INTERMEDIATE Intermediate     TRIMETH/SULFA <=20 SENSITIVE Sensitive     AMPICILLIN/SULBACTAM 4 SENSITIVE Sensitive     PIP/TAZO <=4 SENSITIVE Sensitive     Extended ESBL NEGATIVE Sensitive     * >=100,000 COLONIES/mL KLEBSIELLA PNEUMONIAE  Urine Culture     Status: Abnormal   Collection Time: 07/22/17  8:53 PM  Result Value Ref Range Status   Specimen Description URINE, CATHETERIZED  Final   Special Requests   Final    NONE Performed at [facility], [place] [address], [place], [place] [address]    Culture >=100,000 COLONIES/mL KLEBSIELLA PNEUMONIAE (A)  Final   Report Status 07/25/2017 FINAL  Final   Organism ID, Bacteria KLEBSIELLA PNEUMONIAE (A)  Final      Susceptibility   Klebsiella pneumoniae - MIC*    AMPICILLIN >=32 RESISTANT Resistant     CEFAZOLIN <=4 SENSITIVE Sensitive     CEFTRIAXONE <=1 SENSITIVE Sensitive     CIPROFLOXACIN <=0.25 SENSITIVE Sensitive      GENTAMICIN <=1 SENSITIVE Sensitive     IMIPENEM <=0.25 SENSITIVE Sensitive     NITROFURANTOIN 128 RESISTANT Resistant     TRIMETH/SULFA <=20 SENSITIVE Sensitive     AMPICILLIN/SULBACTAM >=32 RESISTANT Resistant     PIP/TAZO 32 INTERMEDIATE Intermediate     Extended ESBL NEGATIVE Sensitive     * >=100,000 COLONIES/mL KLEBSIELLA PNEUMONIAE  MRSA PCR Screening     Status: None   Collection Time: 07/23/17  3:20 PM  Result Value Ref Range Status   MRSA by PCR NEGATIVE NEGATIVE Final    Comment:        The GeneXpert MRSA Assay (FDA approved for NASAL specimens only), is one component of a comprehensive MRSA colonization surveillance program. It is not intended to diagnose MRSA infection nor to guide or monitor treatment for MRSA infections. Performed at [facility], [place] [address] [name], [place] [address]        Radiology Studies: Ct Head Wo Contrast  Result Date: 07/23/2017 CLINICAL DATA:  Multiple falls, neck pain. EXAM: CT HEAD WITHOUT CONTRAST CT CERVICAL SPINE WITHOUT CONTRAST TECHNIQUE: Multidetector CT imaging of the head and cervical spine was performed following the standard protocol without intravenous contrast. Multiplanar CT image reconstructions of the cervical spine were also generated. COMPARISON:  CT scan of October 25, 2015. FINDINGS: CT HEAD FINDINGS Brain: Mild diffuse cortical atrophy is noted. 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 No mass effect or midline shift is noted. Ventricular size is within normal limits. There is no evidence of mass lesion, hemorrhage or acute infarction. Vascular: No hyperdense vessel or unexpected calcification. Skull: Normal. Negative for fracture or focal lesion. Sinuses/Orbits: No acute finding. Other: None. CT CERVICAL SPINE FINDINGS Alignment: Normal. Skull base and vertebrae: No acute fracture. No primary bone lesion or focal pathologic process. Soft tissues and spinal canal: No prevertebral fluid or swelling. No  visible canal hematoma. Disc levels: Moderate  degenerative disc disease is noted at C5-6 and C6-7 with anterior osteophyte formation. Upper chest: Negative. Other: Degenerative changes are seen involving the posterior facet joints bilaterally. IMPRESSION: Mild diffuse cortical atrophy. Old right cerebellar infarction. No acute intracranial abnormality seen. Multilevel degenerative disc disease. No acute abnormality seen in the cervical spine. Electronically Signed   By: Marijo Conception, M.D.   On: 07/23/2017 12:13   Ct Cervical Spine Wo Contrast  Result Date: 07/23/2017 CLINICAL DATA:  Multiple falls, neck pain. EXAM: CT HEAD WITHOUT CONTRAST CT CERVICAL SPINE WITHOUT CONTRAST TECHNIQUE: Multidetector CT imaging of the head and cervical spine was performed following the standard protocol without intravenous contrast. Multiplanar CT image reconstructions of the cervical spine were also generated. COMPARISON:  CT scan of October 25, 2015. FINDINGS: CT HEAD FINDINGS Brain: Mild diffuse cortical atrophy is noted. 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 No mass effect or midline shift is noted. Ventricular size is within normal limits. There is no evidence of mass lesion, hemorrhage or acute infarction. Vascular: No hyperdense vessel or unexpected calcification. Skull: Normal. Negative for fracture or focal lesion. Sinuses/Orbits: No acute finding. Other: None. CT CERVICAL SPINE FINDINGS Alignment: Normal. Skull base and vertebrae: No acute fracture. No primary bone lesion or focal pathologic process. Soft tissues and spinal canal: No prevertebral fluid or swelling. No visible canal hematoma. Disc levels: Moderate degenerative disc disease is noted at C5-6 and C6-7 with anterior osteophyte formation. Upper chest: Negative. Other: Degenerative changes are seen involving the posterior facet joints bilaterally. IMPRESSION: Mild diffuse cortical atrophy. Old right cerebellar infarction. No acute intracranial  abnormality seen. Multilevel degenerative disc disease. No acute abnormality seen in the cervical spine. Electronically Signed   By: Marijo Conception, M.D.   On: 07/23/2017 12:13      Scheduled Meds: . aspirin EC  81 mg Oral Daily  . carvedilol  12.5 mg Oral BID WC  . cephALEXin  500 mg Oral Q6H  . clonazePAM  1 mg Oral TID  . clopidogrel  75 mg Oral Daily  . feeding supplement (PRO-STAT SUGAR FREE 64)  30 mL Oral BID  . ferrous sulfate  325 mg Oral Q breakfast  . heparin  5,000 Units Subcutaneous Q8H  . insulin aspart  0-5 Units Subcutaneous QHS  . insulin aspart  0-9 Units Subcutaneous TID WC  . insulin NPH Human  6 Units Subcutaneous QAC supper  . insulin NPH Human  90 Units Subcutaneous QAC breakfast  . isosorbide dinitrate  10 mg Oral BID  . levothyroxine  50 mcg Oral QAC breakfast  . magnesium oxide  400 mg Oral Daily  . PARoxetine  25 mg Oral Daily  . PARoxetine  37.5 mg Oral Daily  . potassium chloride  10 mEq Oral Daily  . pregabalin  75 mg Oral BID  . rosuvastatin  5 mg Oral q1800  . sodium chloride flush  3 mL Intravenous Q12H   Continuous Infusions: . sodium chloride       LOS: 3 days    Time spent: 40 minutes   Dessa Phi, DO [facility] [email or web address] Password [facility] 07/25/2017, 10:52 AM

## 2017-07-25 NOTE — Progress Notes (Addendum)
Advanced Heart Failure Rounding Note  PCP-Cardiologist: Glori Bickers, MD   Subjective:    RHC showed mild PAD with RV strain. Still off oral diuretics. Legs wrapped. Creatinine continues to improve. Denies CP or SOB. Weight down 1 pound.    Objective:   Weight Range: 131.2 kg (289 lb 3.9 oz) Body mass index is 48.13 kg/m.   Vital Signs:   Temp:  [97.7 F (36.5 C)-98.9 F (37.2 C)] 97.7 F (36.5 C) (03/02 0944) Pulse Rate:  [0-201] 67 (03/02 0944) Resp:  [4-20] 18 (03/02 0944) BP: (124-172)/(56-87) 124/57 (03/02 0944) SpO2:  [0 %-98 %] 94 % (03/02 0944) Weight:  [131.2 kg (289 lb 3.9 oz)] 131.2 kg (289 lb 3.9 oz) (03/01 2151) Last BM Date: 07/24/17  Weight change: Filed Weights   07/22/17 1644 07/24/17 2151  Weight: 131.5 kg (290 lb) 131.2 kg (289 lb 3.9 oz)    Intake/Output:   Intake/Output Summary (Last 24 hours) at 07/25/2017 1142 Last data filed at 07/25/2017 0900 Gross per 24 hour  Intake 243 ml  Output 550 ml  Net -307 ml      Physical Exam    General:  Sitting in chair . No resp difficulty HEENT: normal Neck: supple. JVP to jaw. Carotids 2+ bilat; no bruits. No lymphadenopathy or thryomegaly appreciated. Cor: PMI nondisplaced. Regular rate & rhythm. No rubs, gallops or murmurs. Lungs: clear decreased BS throughout Abdomen: obese soft, nontender, nondistended. No hepatosplenomegaly. No bruits or masses. Good bowel sounds. Extremities: no cyanosis, clubbing, rash, 2+ edema L>R  UNNA boots Neuro: alert & orientedx3, cranial nerves grossly intact. moves all 4 extremities w/o difficulty. Affect pleasant   Telemetry   NSR 60-70s, personally reviewed.   EKG    No new tracings.    Labs    CBC Recent Labs    07/22/17 1725  07/23/17 0559 07/24/17 0528  WBC 10.9*  --  8.8 6.9  NEUTROABS 9.4*  --   --   --   HGB 10.4*   < > 9.5* 9.0*  HCT 32.0*   < > 29.7* 28.4*  MCV 77.9*  --  78.0 78.5  PLT 299  --  269 271   < > = values in this  interval not displayed.   Basic Metabolic Panel Recent Labs    07/24/17 0528 07/25/17 0533  NA 137 139  K 3.8 3.9  CL 106 109  CO2 17* 21*  GLUCOSE 140* 149*  BUN 121* 102*  CREATININE 3.17* 2.38*  CALCIUM 7.9* 8.4*   Liver Function Tests Recent Labs    07/23/17 0559 07/24/17 0528  AST 12* 10*  ALT 22 17  ALKPHOS 134* 129*  BILITOT 0.4 0.3  PROT 5.8* 5.4*  ALBUMIN 2.4* 2.1*   No results for input(s): LIPASE, AMYLASE in the last 72 hours. Cardiac Enzymes Recent Labs    07/22/17 2155 07/23/17 0559 07/23/17 1003 07/23/17 1521 07/23/17 1844  CKTOTAL 84 63  --   --   --   CKMB 5.2* 3.3  --   --   --   TROPONINI  --  0.17* 0.15* 0.12* 0.15*   BNP: BNP (last 3 results) Recent Labs    01/13/17 1456 02/02/17 1530 04/29/17 1109  BNP 139.5* 131.8* 96.3   ProBNP (last 3 results) No results for input(s): PROBNP in the last 8760 hours.  D-Dimer No results for input(s): DDIMER in the last 72 hours. Hemoglobin A1C No results for input(s): HGBA1C in the last 72 hours.  Fasting Lipid Panel Recent Labs    07/24/17 0528  CHOL 82  HDL 24*  LDLCALC 25  TRIG 166*  CHOLHDL 3.4   Thyroid Function Tests No results for input(s): TSH, T4TOTAL, T3FREE, THYROIDAB in the last 72 hours.  Invalid input(s): FREET3  Other results:  Imaging   No results found.  Medications:    Scheduled Medications: . aspirin EC  81 mg Oral Daily  . carvedilol  12.5 mg Oral BID WC  . cephALEXin  500 mg Oral Q12H  . clonazePAM  1 mg Oral TID  . clopidogrel  75 mg Oral Daily  . feeding supplement (PRO-STAT SUGAR FREE 64)  30 mL Oral BID  . ferrous sulfate  325 mg Oral Q breakfast  . heparin  5,000 Units Subcutaneous Q8H  . insulin aspart  0-5 Units Subcutaneous QHS  . insulin aspart  0-9 Units Subcutaneous TID WC  . insulin NPH Human  6 Units Subcutaneous QAC supper  . insulin NPH Human  90 Units Subcutaneous QAC breakfast  . isosorbide dinitrate  10 mg Oral BID  . levothyroxine   50 mcg Oral QAC breakfast  . magnesium oxide  400 mg Oral Daily  . PARoxetine  25 mg Oral Daily  . PARoxetine  37.5 mg Oral Daily  . potassium chloride  10 mEq Oral Daily  . pregabalin  75 mg Oral BID  . rosuvastatin  5 mg Oral q1800  . sodium chloride flush  3 mL Intravenous Q12H    Infusions: . sodium chloride      PRN Medications: sodium chloride, acetaminophen, albuterol, ondansetron (ZOFRAN) IV, sodium chloride flush  Patient Profile   Susan [name] is a 67 y.o. female with a hx ofRBBB, DM, Hypothyroid, diastolic heart failure, MI 2000 w/ BMS RCA, STEMI 2007 w/ BMS RCA, MV 01/2017 w/ no scar or ischemia and EF 68%, TIA, obesity, who is being seen today for the evaluation of elevated troponin and CP at the request of Dr Nevada Crane.  Admitted to [facility] 07/23/17 with CP, mild troponin elevation, and ARF.   Assessment/Plan   1. ARF on CKD IV due to ATN - Creatinine improved back to baseline but BUN still very high. Will resume diuretics at low dose tonight. - Follows with Dr. Hollie Salk  2. Acute on chronic diastolic CHF, RV failure - Echo 07/23/17 LVEF 55%, RV grossly normal.  - Troponin peak at 0.15, likely demand ischemia.  - RHC confirms R>>L HF due to OHS/OSA - Will restart oral diuretics gently tonight with torsemide 40 bid (was on 100 bid at home). Legs wrapped - Stressed need to pursue w/u for CAP/BIPAP and lose weight but she has been unmotivated to do this in past - Check ABG to look for CO2 retention   3. CAD s/p BMS 2007. - stable. Minimally elevated troponin now c/w ACS  4. Obesity - Body mass index is 48.13 kg/m.  - Pt needs to lose significant amount of weight  5. Poorly controlled, DM - Per primary.   6. Balance issues. - consult PT  We will see again on Monday of she is still here (ok to send home tomorrow if improved). Call with questions.   Length of Stay: 3  Glori Bickers, MD  07/25/2017, 11:42 AM  Advanced Heart Failure Team Pager [phone] (M-F; 7a -  4p)  Please contact [facility] for night-coverage after hours (4p -7a ) and weekends on [email or web address]

## 2017-07-26 LAB — BASIC METABOLIC PANEL
Anion gap: 8 (ref 5–15)
BUN: 89 mg/dL — ABNORMAL HIGH (ref 6–20)
CO2: 22 mmol/L (ref 22–32)
Calcium: 8.5 mg/dL — ABNORMAL LOW (ref 8.9–10.3)
Chloride: 109 mmol/L (ref 101–111)
Creatinine, Ser: 2.04 mg/dL — ABNORMAL HIGH (ref 0.44–1.00)
GFR calc Af Amer: 28 mL/min — ABNORMAL LOW (ref >60)
GFR calc non Af Amer: 24 mL/min — ABNORMAL LOW (ref >60)
Glucose, Bld: 145 mg/dL — ABNORMAL HIGH (ref 65–99)
Potassium: 4.1 mmol/L (ref 3.5–5.1)
Sodium: 139 mmol/L (ref 135–145)

## 2017-07-26 LAB — CBC
HCT: 27.4 % — ABNORMAL LOW (ref 36.0–46.0)
Hemoglobin: 8.7 g/dL — ABNORMAL LOW (ref 12.0–15.0)
MCH: 25.3 pg — ABNORMAL LOW (ref 26.0–34.0)
MCHC: 31.8 g/dL (ref 30.0–36.0)
MCV: 79.7 fL (ref 78.0–100.0)
Platelets: 271 10*3/uL (ref 150–400)
RBC: 3.44 MIL/uL — ABNORMAL LOW (ref 3.87–5.11)
RDW: 16.3 % — ABNORMAL HIGH (ref 11.5–15.5)
WBC: 5.5 10*3/uL (ref 4.0–10.5)

## 2017-07-26 LAB — GLUCOSE, CAPILLARY
Glucose-Capillary: 122 mg/dL — ABNORMAL HIGH (ref 65–99)
Glucose-Capillary: 211 mg/dL — ABNORMAL HIGH (ref 65–99)
Glucose-Capillary: 246 mg/dL — ABNORMAL HIGH (ref 65–99)
Glucose-Capillary: 290 mg/dL — ABNORMAL HIGH (ref 65–99)
Glucose-Capillary: 238 mg/dL — ABNORMAL HIGH (ref 65–99)

## 2017-07-26 MED ORDER — SENNA 8.6 MG PO TABS: 1.0000 | ORAL_TABLET | Freq: Every day | ORAL | Status: DC | PRN

## 2017-07-26 MED ORDER — POLYETHYLENE GLYCOL 3350 17 G PO PACK
17.0000 g | PACK | Freq: Every day | ORAL | Status: DC
  Administered 2017-07-26 – 2017-07-28 (×2): 17 g via ORAL
  Filled 2017-07-26 (×3): qty 1

## 2017-07-26 NOTE — Progress Notes (Addendum)
PROGRESS NOTE    Susan [name]  MEQ:[id number] DOB: [birth year] DOA: 07/22/2017 PCP: Alycia Rossetti, MD     Brief Narrative:  Susan [name] is a 67 yo female with past medical history significant for DM type 2 with neuropathy, CKD stage 4, hypertension, HLD, CAD, CHF, CVA who was recently in the hospital for hyperosmolar non-ketotic state, Klebsiella UTI. At time of discharge, she refused SNF and went home. She now returns with continued falls and poor PO intake at home. She was admitted for AKI, acute metabolic encephalopathy as well as elevated troponin. Advanced heart failure team was consulted and patient underwent RHC 3/1.   Assessment & Plan:   Principal Problem:   ARF (acute renal failure) (HCC) Active Problems:   Diabetes mellitus type II, uncontrolled (HCC)   Anemia   Elevated troponin   PAH (pulmonary artery hypertension) (HCC)   AKI (acute kidney injury) ([place])   AKI on CKD stage 4 -Follows with Dr. Hollie Salk as outpatient  -Baseline Cr 2.2 -Back to baseline, Cr 2.04 today   Acute metabolic encephalopathy  -Resolved  Acute on chronic diastolic HF, RV failure   -EF 55% -Advanced heart failure team following -Torsemide started   Recurrent falls -PT recommending SNF, patient more agreeable this time. SW consulted   Klebsiella UTI, POA -Sensitive to cephalosporins. Deescalate to keflex   DM type 2, with hyperglycemia -Ha1c 11.4 on 07/16/17  -NPH and SSI   Elevated troponin -Troponin peak at 0.26  -Advanced heart failure team following -S/p right heart cath 3/1   CAD  -Continue aspirin, plavix, coreg, isordil, crestor   Anemia of chronic disease -Baseline Hgb 9-10 -Continue iron supplement -Stable   Hypothyroidism -Continue synthroid   Depression -Continue paxil  Morbid obesity -Body mass index is 49.64 kg/m. -Encourage weight loss   DVT prophylaxis: subq hep  Code Status: Full Family Communication: No family at bedside Disposition Plan: Ready  for discharge once SNF work up complete    Consultants:   Advance heart failure  Procedures:   Right heart cath 3/1  Antimicrobials:  Anti-infectives (From admission, onward)   Start     Dose/Rate Route Frequency Ordered Stop   07/25/17 1115  cephALEXin (KEFLEX) capsule 500 mg     500 mg Oral Every 12 hours 07/25/17 1049     07/23/17 0800  cefUROXime (CEFTIN) tablet 500 mg  Status:  Discontinued     500 mg Oral 2 times daily with meals 07/23/17 0208 07/23/17 0621   07/23/17 0800  cefTRIAXone (ROCEPHIN) 2 g in sodium chloride 0.9 % 100 mL IVPB  Status:  Discontinued     2 g 200 mL/hr over 30 Minutes Intravenous Every 24 hours 07/23/17 0728 07/25/17 1049       Subjective: States she is tired. Otherwise feeling well, no SOB or CP   Objective: Vitals:   07/25/17 1719 07/25/17 2212 07/26/17 0445 07/26/17 0900  BP: 123/73 (!) 150/61 111/75 (!) 124/45  Pulse: 77 73 72 73  Resp: 18 17 16 18   Temp: 97.9 F (36.6 C) 98 F (36.7 C) 98.2 F (36.8 C) 98.1 F (36.7 C)  TempSrc: Oral Oral Oral Oral  SpO2: 99% 98% 98% 97%  Weight:  135.3 kg (298 lb 4.5 oz)    Height:        Intake/Output Summary (Last 24 hours) at 07/26/2017 1047 Last data filed at 07/26/2017 0600 Gross per 24 hour  Intake 720 ml  Output 1800 ml  Net -1080 ml  Filed Weights   07/22/17 1644 07/24/17 2151 07/25/17 2212  Weight: 131.5 kg (290 lb) 131.2 kg (289 lb 3.9 oz) 135.3 kg (298 lb 4.5 oz)    Examination: General exam: Appears calm and comfortable  Respiratory system: Clear to auscultation. Respiratory effort normal. Cardiovascular system: S1 & S2 heard, RRR. No JVD, murmurs, rubs, gallops or clicks. +1 pedal edema. Gastrointestinal system: Abdomen is nondistended, soft and nontender. No organomegaly or masses felt. Normal bowel sounds heard. Central nervous system: Alert and oriented. No focal neurological deficits. Extremities: Symmetric 5 x 5 power. Skin: No rashes, lesions or ulcers Psychiatry:  Judgement and insight appear normal. Mood & affect appropriate.    Data Reviewed: I have personally reviewed following labs and imaging studies  CBC: Recent Labs  Lab 07/22/17 1725 07/22/17 1745 07/23/17 0559 07/24/17 0528 07/26/17 0625  WBC 10.9*  --  8.8 6.9 5.5  NEUTROABS 9.4*  --   --   --   --   HGB 10.4* 10.9* 9.5* 9.0* 8.7*  HCT 32.0* 32.0* 29.7* 28.4* 27.4*  MCV 77.9*  --  78.0 78.5 79.7  PLT 299  --  269 271 440   Basic Metabolic Panel: Recent Labs  Lab 07/22/17 1725 07/22/17 1745 07/23/17 0559 07/24/17 0528 07/25/17 0533 07/26/17 0625  NA 133* 135 134* 137 139 139  K 4.1 4.1 4.0 3.8 3.9 4.1  CL 102 102 102 106 109 109  CO2 17*  --  16* 17* 21* 22  GLUCOSE 109* 103* 178* 140* 149* 145*  BUN 123* 122* 126* 121* 102* 89*  CREATININE 4.81* 4.80* 4.47* 3.17* 2.38* 2.04*  CALCIUM 7.7*  --  7.7* 7.9* 8.4* 8.5*   GFR: Estimated Creatinine Clearance: 36.8 mL/min (A) (by C-G formula based on SCr of 2.04 mg/dL (H)). Liver Function Tests: Recent Labs  Lab 07/22/17 1725 07/23/17 0559 07/24/17 0528  AST 16 12* 10*  ALT 27 22 17   ALKPHOS 148* 134* 129*  BILITOT 0.4 0.4 0.3  PROT 6.1* 5.8* 5.4*  ALBUMIN 2.5* 2.4* 2.1*   No results for input(s): LIPASE, AMYLASE in the last 168 hours. No results for input(s): AMMONIA in the last 168 hours. Coagulation Profile: Recent Labs  Lab 07/24/17 1133  INR 1.15   Cardiac Enzymes: Recent Labs  Lab 07/22/17 1738 07/22/17 2155 07/23/17 0559 07/23/17 1003 07/23/17 1521 07/23/17 1844  CKTOTAL  --  84 63  --   --   --   CKMB  --  5.2* 3.3  --   --   --   TROPONINI 0.26*  --  0.17* 0.15* 0.12* 0.15*   BNP (last 3 results) No results for input(s): PROBNP in the last 8760 hours. HbA1C: No results for input(s): HGBA1C in the last 72 hours. CBG: Recent Labs  Lab 07/25/17 1717 07/25/17 2215 07/25/17 2320 07/26/17 0206 07/26/17 0757  GLUCAP 117* 51* 95 122* 211*   Lipid Profile: Recent Labs    07/24/17 0528    CHOL 82  HDL 24*  LDLCALC 25  TRIG 166*  CHOLHDL 3.4   Thyroid Function Tests: No results for input(s): TSH, T4TOTAL, FREET4, T3FREE, THYROIDAB in the last 72 hours. Anemia Panel: No results for input(s): VITAMINB12, FOLATE, FERRITIN, TIBC, IRON, RETICCTPCT in the last 72 hours. Sepsis Labs: Recent Labs  Lab 07/22/17 1802  LATICACIDVEN 0.95    Recent Results (from the past 240 hour(s))  Urine Culture     Status: Abnormal   Collection Time: 07/22/17  8:53 PM  Result  Value Ref Range Status   Specimen Description URINE, CATHETERIZED  Final   Special Requests   Final    NONE Performed at [facility], [place] [address] [place], [place] [address]    Culture >=100,000 COLONIES/mL KLEBSIELLA PNEUMONIAE (A)  Final   Report Status 07/25/2017 FINAL  Final   Organism ID, Bacteria KLEBSIELLA PNEUMONIAE (A)  Final      Susceptibility   Klebsiella pneumoniae - MIC*    AMPICILLIN >=32 RESISTANT Resistant     CEFAZOLIN <=4 SENSITIVE Sensitive     CEFTRIAXONE <=1 SENSITIVE Sensitive     CIPROFLOXACIN <=0.25 SENSITIVE Sensitive     GENTAMICIN <=1 SENSITIVE Sensitive     IMIPENEM <=0.25 SENSITIVE Sensitive     NITROFURANTOIN 128 RESISTANT Resistant     TRIMETH/SULFA <=20 SENSITIVE Sensitive     AMPICILLIN/SULBACTAM >=32 RESISTANT Resistant     PIP/TAZO 32 INTERMEDIATE Intermediate     Extended ESBL NEGATIVE Sensitive     * >=100,000 COLONIES/mL KLEBSIELLA PNEUMONIAE  MRSA PCR Screening     Status: None   Collection Time: 07/23/17  3:20 PM  Result Value Ref Range Status   MRSA by PCR NEGATIVE NEGATIVE Final    Comment:        The GeneXpert MRSA Assay (FDA approved for NASAL specimens only), is one component of a comprehensive MRSA colonization surveillance program. It is not intended to diagnose MRSA infection nor to guide or monitor treatment for MRSA infections. Performed at [facility], [place] [address], [place], [place] [address]        Radiology Studies: No  results found.    Scheduled Meds: . aspirin EC  81 mg Oral Daily  . carvedilol  12.5 mg Oral BID WC  . cephALEXin  500 mg Oral Q12H  . clonazePAM  1 mg Oral TID  . clopidogrel  75 mg Oral Daily  . feeding supplement (PRO-STAT SUGAR FREE 64)  30 mL Oral BID  . ferrous sulfate  325 mg Oral Q breakfast  . heparin  5,000 Units Subcutaneous Q8H  . insulin aspart  0-5 Units Subcutaneous QHS  . insulin aspart  0-9 Units Subcutaneous TID WC  . insulin NPH Human  6 Units Subcutaneous QAC supper  . insulin NPH Human  90 Units Subcutaneous QAC breakfast  . isosorbide dinitrate  10 mg Oral BID  . levothyroxine  50 mcg Oral QAC breakfast  . magnesium oxide  400 mg Oral Daily  . PARoxetine  25 mg Oral Daily  . PARoxetine  37.5 mg Oral Daily  . potassium chloride  10 mEq Oral Daily  . pregabalin  75 mg Oral BID  . rosuvastatin  5 mg Oral q1800  . sodium chloride flush  3 mL Intravenous Q12H  . torsemide  40 mg Oral BID   Continuous Infusions: . sodium chloride       LOS: 4 days    Time spent: 30 minutes   Dessa Phi, DO [facility] [email or web address] Password [facility] 07/26/2017, 10:47 AM

## 2017-07-26 NOTE — Clinical Social Work Note (Signed)
Pt does not have a PASRR.Pt can not go to a SNF without a PASRR number. PASRR requesting addition information, however, do not provide PASRRs on the weekend. CSW to provide PASRR with appropriate documents 3/4 in order to get pt's PASRR.  [place], [place]

## 2017-07-27 ENCOUNTER — Other Ambulatory Visit: Payer: Self-pay | Admitting: Licensed Clinical Social Worker

## 2017-07-27 ENCOUNTER — Encounter: Payer: Self-pay | Admitting: Licensed Clinical Social Worker

## 2017-07-27 DIAGNOSIS — B9689 Other specified bacterial agents as the cause of diseases classified elsewhere: Secondary | ICD-10-CM

## 2017-07-27 DIAGNOSIS — B961 Klebsiella pneumoniae [K. pneumoniae] as the cause of diseases classified elsewhere: Secondary | ICD-10-CM

## 2017-07-27 DIAGNOSIS — B9620 Unspecified Escherichia coli [E. coli] as the cause of diseases classified elsewhere: Secondary | ICD-10-CM

## 2017-07-27 DIAGNOSIS — N390 Urinary tract infection, site not specified: Secondary | ICD-10-CM

## 2017-07-27 DIAGNOSIS — E039 Hypothyroidism, unspecified: Secondary | ICD-10-CM

## 2017-07-27 LAB — GLUCOSE, CAPILLARY
Glucose-Capillary: 206 mg/dL — ABNORMAL HIGH (ref 65–99)
Glucose-Capillary: 225 mg/dL — ABNORMAL HIGH (ref 65–99)
Glucose-Capillary: 396 mg/dL — ABNORMAL HIGH (ref 65–99)
Glucose-Capillary: 356 mg/dL — ABNORMAL HIGH (ref 65–99)

## 2017-07-27 LAB — BASIC METABOLIC PANEL
Anion gap: 10 (ref 5–15)
BUN: 86 mg/dL — ABNORMAL HIGH (ref 6–20)
CO2: 25 mmol/L (ref 22–32)
Calcium: 8.8 mg/dL — ABNORMAL LOW (ref 8.9–10.3)
Chloride: 104 mmol/L (ref 101–111)
Creatinine, Ser: 1.83 mg/dL — ABNORMAL HIGH (ref 0.44–1.00)
GFR calc Af Amer: 32 mL/min — ABNORMAL LOW (ref >60)
GFR calc non Af Amer: 27 mL/min — ABNORMAL LOW (ref >60)
Glucose, Bld: 185 mg/dL — ABNORMAL HIGH (ref 65–99)
Potassium: 3.9 mmol/L (ref 3.5–5.1)
Sodium: 139 mmol/L (ref 135–145)

## 2017-07-27 LAB — AMMONIA: Ammonia: 16 umol/L (ref 9–35)

## 2017-07-27 MED ORDER — TORSEMIDE 20 MG PO TABS
20.0000 mg | ORAL_TABLET | ORAL | Status: AC
  Administered 2017-07-27: 20 mg via ORAL
  Filled 2017-07-27: qty 1

## 2017-07-27 MED ORDER — TORSEMIDE 20 MG PO TABS
60.0000 mg | ORAL_TABLET | Freq: Two times a day (BID) | ORAL | Status: DC
  Administered 2017-07-27 – 2017-07-28 (×3): 60 mg via ORAL
  Filled 2017-07-27 (×3): qty 3

## 2017-07-27 MED ORDER — CLONAZEPAM 1 MG PO TABS: ORAL_TABLET | ORAL | 0 refills | Status: DC

## 2017-07-27 MED ORDER — TORSEMIDE 20 MG PO TABS: 60.0000 mg | ORAL_TABLET | Freq: Two times a day (BID) | ORAL | 0 refills | Status: DC

## 2017-07-27 MED ORDER — INSULIN NPH (HUMAN) (ISOPHANE) 100 UNIT/ML ~~LOC~~ SUSP: SUBCUTANEOUS | 0 refills | Status: DC

## 2017-07-27 NOTE — Progress Notes (Signed)
Advanced Heart Failure Rounding Note  PCP-Cardiologist: Glori Bickers, MD   Subjective:    Denies CP or SOB.  Weight stable on po diuretics. Creatinine continues to improve.   Objective:   Weight Range: 132 kg (291 lb 0.1 oz) Body mass index is 48.43 kg/m.   Vital Signs:   Temp:  [98 F (36.7 C)-98.2 F (36.8 C)] 98.2 F (36.8 C) (03/04 0431) Pulse Rate:  [66-72] 72 (03/04 0431) Resp:  [18] 18 (03/04 0431) BP: (119-149)/(57-69) 119/57 (03/04 0431) SpO2:  [96 %-97 %] 96 % (03/04 0431) Weight:  [132 kg (291 lb 0.1 oz)] 132 kg (291 lb 0.1 oz) (03/03 2050) Last BM Date: 07/26/17  Weight change: Filed Weights   07/24/17 2151 07/25/17 2212 07/26/17 2050  Weight: 131.2 kg (289 lb 3.9 oz) 135.3 kg (298 lb 4.5 oz) 132 kg (291 lb 0.1 oz)    Intake/Output:   Intake/Output Summary (Last 24 hours) at 07/27/2017 0952 Last data filed at 07/27/2017 0600 Gross per 24 hour  Intake 342 ml  Output 2700 ml  Net -2358 ml      Physical Exam    General:  Lying in bed No resp difficulty HEENT: normal Neck: supple. JVP jaw Carotids 2+ bilat; no bruits. No lymphadenopathy or thryomegaly appreciated. Cor: PMI nondisplaced. Regular rate & rhythm. No rubs, gallops or murmurs. Lungs: clear Abdomen: obese soft, nontender, nondistended. No hepatosplenomegaly. No bruits or masses. Good bowel sounds. Extremities: no cyanosis, clubbing, rash, 2+ edema. UNNA boots Neuro: alert & orientedx3, cranial nerves grossly intact. moves all 4 extremities w/o difficulty. Affect pleasant   Telemetry   NSR 70s, personally reviewed.   EKG    No new tracings.    Labs    CBC Recent Labs    07/26/17 0625  WBC 5.5  HGB 8.7*  HCT 27.4*  MCV 79.7  PLT 101   Basic Metabolic Panel Recent Labs    07/26/17 0625 07/27/17 0538  NA 139 139  K 4.1 3.9  CL 109 104  CO2 22 25  GLUCOSE 145* 185*  BUN 89* 86*  CREATININE 2.04* 1.83*  CALCIUM 8.5* 8.8*   Liver Function Tests No results for  input(s): AST, ALT, ALKPHOS, BILITOT, PROT, ALBUMIN in the last 72 hours. No results for input(s): LIPASE, AMYLASE in the last 72 hours. Cardiac Enzymes No results for input(s): CKTOTAL, CKMB, CKMBINDEX, TROPONINI in the last 72 hours. BNP: BNP (last 3 results) Recent Labs    01/13/17 1456 02/02/17 1530 04/29/17 1109  BNP 139.5* 131.8* 96.3   ProBNP (last 3 results) No results for input(s): PROBNP in the last 8760 hours.  D-Dimer No results for input(s): DDIMER in the last 72 hours. Hemoglobin A1C No results for input(s): HGBA1C in the last 72 hours. Fasting Lipid Panel No results for input(s): CHOL, HDL, LDLCALC, TRIG, CHOLHDL, LDLDIRECT in the last 72 hours. Thyroid Function Tests No results for input(s): TSH, T4TOTAL, T3FREE, THYROIDAB in the last 72 hours.  Invalid input(s): FREET3  Other results:  Imaging   No results found.  Medications:    Scheduled Medications: . aspirin EC  81 mg Oral Daily  . carvedilol  12.5 mg Oral BID WC  . cephALEXin  500 mg Oral Q12H  . clonazePAM  1 mg Oral TID  . clopidogrel  75 mg Oral Daily  . feeding supplement (PRO-STAT SUGAR FREE 64)  30 mL Oral BID  . ferrous sulfate  325 mg Oral Q breakfast  . heparin  5,000 Units Subcutaneous Q8H  . insulin aspart  0-5 Units Subcutaneous QHS  . insulin aspart  0-9 Units Subcutaneous TID WC  . insulin NPH Human  6 Units Subcutaneous QAC supper  . insulin NPH Human  90 Units Subcutaneous QAC breakfast  . isosorbide dinitrate  10 mg Oral BID  . levothyroxine  50 mcg Oral QAC breakfast  . magnesium oxide  400 mg Oral Daily  . PARoxetine  25 mg Oral Daily  . PARoxetine  37.5 mg Oral Daily  . polyethylene glycol  17 g Oral Daily  . potassium chloride  10 mEq Oral Daily  . pregabalin  75 mg Oral BID  . rosuvastatin  5 mg Oral q1800  . sodium chloride flush  3 mL Intravenous Q12H  . torsemide  20 mg Oral NOW  . torsemide  60 mg Oral BID    Infusions: . sodium chloride      PRN  Medications: sodium chloride, acetaminophen, albuterol, ondansetron (ZOFRAN) IV, senna, sodium chloride flush  Patient Profile   Margaree Sandhu is a 67 y.o. female with a hx ofRBBB, DM, Hypothyroid, diastolic heart failure, MI 2000 w/ BMS RCA, STEMI 2007 w/ BMS RCA, MV 01/2017 w/ no scar or ischemia and EF 68%, TIA, obesity, who is being seen today for the evaluation of elevated troponin and CP at the request of Dr Nevada Crane.  Admitted to [facility] 07/23/17 with CP, mild troponin elevation, and ARF.   Assessment/Plan   1. ARF on CKD IV due to ATN - Resolved with holding diuretics  2. Acute on chronic diastolic CHF, RV failure - Echo 07/23/17 LVEF 55%, RV grossly normal.  - Troponin peak at 0.15, likely demand ischemia.  - RHC confirms R>>L HF due to OHS/OSA -  Can go home today on torsemide 60 bid. (was on 100 bid previously)  Watch closely - ABG without evidence of CO2 retention    3. CAD s/p BMS 2007. - stable. Minimally elevated troponin now c/w ACS  4. Obesity - Body mass index is 48.43 kg/m.  - Pt needs to lose significant amount of weight  5. Poorly controlled, DM - Per primary.   6. Balance issues. - consult PT  We will see again on Monday of she is still here (ok to send home tomorrow if improved). Call with questions.   Length of Stay: [place], MD  07/27/2017, 9:52 AM  Advanced Heart Failure Team Pager [phone] (M-F; 7a - 4p)  Please contact [facility] for night-coverage after hours (4p -7a ) and weekends on [email or web address]

## 2017-07-27 NOTE — NC FL2 (Signed)
[place] LEVEL OF CARE SCREENING TOOL     IDENTIFICATION  Patient Name: Susan [name] Birthdate: [birth year] Sex: female Admission Date (Current Location): 07/22/2017  [facility] and [place] Number:  Herbalist and Address:  [facility], [place] [address] [place], [place] [address]      Provider Number: [id number]  Attending Physician Name and Address:  Dessa Phi, DO  Relative Name and Phone Number:  Zully Frane - spouse, [phone] (mobile) and [name], daughter [phone]     Current Level of Care: Hospital Recommended Level of Care: [place] Prior Approval Number:    Date Approved/Denied:   PASRR Number: [id number] E(Eff. 3/4 - 08/26/17)  Discharge Plan: SNF    Current Diagnoses: Patient Active Problem List   Diagnosis Date Noted  . CKD (chronic kidney disease) stage 4, GFR 15-29 ml/min (HCC) 07/27/2017  . UTI due to Klebsiella species 07/27/2017  . Hypothyroid 07/27/2017  . PAH (pulmonary artery hypertension) ([place])   . Elevated troponin 07/22/2017  . Impaired ambulation 07/19/2017  . 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 Leg cramps 02/27/2017  . Peripheral edema 01/12/2017  . Diabetic neuropathy ([place]) 11/12/2016  . Weakness generalized   . 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 mellitus ([place]) 10/24/2015  . Anemia 10/03/2015  . Generalized anxiety disorder 10/03/2015  . Insomnia 10/03/2015  . Gastroenteritis 06/07/2015  . Diabetes mellitus type II, uncontrolled ([place]) 06/07/2015  . Chronic diastolic CHF (congestive heart failure) ([place]) 06/07/2015  . Non compliance with medical treatment 04/17/2014  . Rotator cuff tear 03/14/2014  . Morbid obesity ([place]) 09/23/2013  . ARF (acute renal failure) ([place]) 12/25/2012  . Generalized weakness 12/25/2012  . Anxiety and depression 12/25/2012  . Urinary incontinence   . MDD (major depressive disorder)  11/12/2010  . RBBB (right bundle branch block)   . CAD (coronary artery disease)   . Hyperlipemia 01/22/2009  . Essential hypertension 01/22/2009    Orientation RESPIRATION BLADDER Height & Weight     Self, Time, Situation, Place  Normal External catheter, Incontinent Weight: 291 lb 0.1 oz (132 kg) Height:  5\' 5"  (165.1 cm)  BEHAVIORAL SYMPTOMS/MOOD NEUROLOGICAL BOWEL NUTRITION STATUS      Continent Diet(Heart healthy/carb modified)  AMBULATORY STATUS COMMUNICATION OF NEEDS Skin   Total Care Verbally Other (Comment)(Cellulitis to right/left legs; MASD to breast and abdomen, treated with barrier cream)                       Personal Care Assistance Level of Assistance  Bathing, Feeding, Dressing Bathing Assistance: Maximum assistance(Upper body mod assist and lower body total assist) Feeding assistance: Limited assistance(Assistance with set-up) Dressing Assistance: Maximum assistance(Upper body min assist and lower body total assist)     Functional Limitations Info  Sight, Hearing, Speech Sight Info: Adequate Hearing Info: Adequate Speech Info: Adequate    SPECIAL CARE FACTORS FREQUENCY  PT (By licensed PT), OT (By licensed OT)     PT Frequency: Evaluated 3/1 and a minimum of 2X per week recommended during acute inpatient stay OT Frequency: Evaluated 3/1 and a minimum of 2X per week recommended during acute inpatient stay            Contractures Contractures Info: Not present    Additional Factors Info  Code Status, Allergies Code Status Info: Full Allergies Info: Codeine           Current Medications (07/27/2017):  This is the current hospital active medication list Current Facility-Administered Medications  Medication Dose Route Frequency Provider Last Rate Last Dose  . 0.9 %  sodium chloride infusion  250 mL Intravenous PRN Bensimhon, Shaune Pascal, MD      . acetaminophen (TYLENOL) tablet 650 mg  650 mg Oral Q4H PRN Bensimhon, Shaune Pascal, MD   650 mg at  07/24/17 2206  . albuterol (PROVENTIL) (2.5 MG/3ML) 0.083% nebulizer solution 2.5 mg  2.5 mg Nebulization Q6H PRN Irene Pap N, DO      . aspirin EC tablet 81 mg  81 mg Oral Daily Jani Gravel, MD   81 mg at 07/27/17 1054  . carvedilol (COREG) tablet 12.5 mg  12.5 mg Oral BID WC Jani Gravel, MD   12.5 mg at 07/27/17 0846  . clonazePAM (KLONOPIN) tablet 1 mg  1 mg Oral TID Jani Gravel, MD   1 mg at 07/27/17 1054  . clopidogrel (PLAVIX) tablet 75 mg  75 mg Oral Daily Jani Gravel, MD   75 mg at 07/27/17 1054  . feeding supplement (PRO-STAT SUGAR FREE 64) liquid 30 mL  30 mL Oral BID Jani Gravel, MD   30 mL at 07/27/17 1054  . ferrous sulfate tablet 325 mg  325 mg Oral Q breakfast Jani Gravel, MD   325 mg at 07/27/17 0846  . heparin injection 5,000 Units  5,000 Units Subcutaneous Q8H Bensimhon, Shaune Pascal, MD   5,000 Units at 07/27/17 0535  . insulin aspart (novoLOG) injection 0-5 Units  0-5 Units Subcutaneous QHS Jani Gravel, MD   2 Units at 07/26/17 2315  . insulin aspart (novoLOG) injection 0-9 Units  0-9 Units Subcutaneous TID WC Jani Gravel, MD   3 Units at 07/27/17 [phone]  . insulin NPH Human (HUMULIN N,NOVOLIN N) injection 6 Units  6 Units Subcutaneous QAC supper Kayleen Memos, DO   6 Units at 07/26/17 1738  . insulin NPH Human (HUMULIN N,NOVOLIN N) injection 90 Units  90 Units Subcutaneous QAC breakfast Jani Gravel, MD   90 Units at 07/26/17 0802  . isosorbide dinitrate (ISORDIL) tablet 10 mg  10 mg Oral BID Jani Gravel, MD   10 mg at 07/27/17 1054  . levothyroxine (SYNTHROID, LEVOTHROID) tablet 50 mcg  50 mcg Oral QAC breakfast Jani Gravel, MD   50 mcg at 07/27/17 0846  . magnesium oxide (MAG-OX) tablet 400 mg  400 mg Oral Daily Jani Gravel, MD   400 mg at 07/27/17 1054  . ondansetron (ZOFRAN) injection 4 mg  4 mg Intravenous Q6H PRN Bensimhon, Shaune Pascal, MD      . PARoxetine (PAXIL-CR) 24 hr tablet 25 mg  25 mg Oral Daily Jani Gravel, MD   25 mg at 07/27/17 1056  . PARoxetine (PAXIL-CR) 24 hr tablet 37.5 mg   37.5 mg Oral Daily Jani Gravel, MD   37.5 mg at 07/27/17 1056  . polyethylene glycol (MIRALAX / GLYCOLAX) packet 17 g  17 g Oral Daily Dessa Phi, DO   17 g at 07/26/17 1636  . potassium chloride (K-DUR,KLOR-CON) CR tablet 10 mEq  10 mEq Oral Daily Jani Gravel, MD   10 mEq at 07/27/17 1054  . pregabalin (LYRICA) capsule 75 mg  75 mg Oral BID Jani Gravel, MD   75 mg at 07/27/17 1054  . rosuvastatin (CRESTOR) tablet 5 mg  5 mg Oral q1800 Jani Gravel, MD   5 mg at 07/26/17 1735  . senna (SENOKOT) tablet 8.6 mg  1 tablet Oral Daily PRN  Dessa Phi, DO      . sodium chloride flush (NS) 0.9 % injection 3 mL  3 mL Intravenous Q12H Bensimhon, Shaune Pascal, MD   3 mL at 07/27/17 1056  . sodium chloride flush (NS) 0.9 % injection 3 mL  3 mL Intravenous PRN Bensimhon, Shaune Pascal, MD   3 mL at 07/25/17 1141  . torsemide (DEMADEX) tablet 60 mg  60 mg Oral BID Bensimhon, Shaune Pascal, MD         Discharge Medications: Please see discharge summary for a list of discharge medications.  Relevant Imaging Results:  Relevant Lab Results:   Additional Information [phone].   Sable Feil, LCSW

## 2017-07-27 NOTE — Clinical Social Work Note (Signed)
CSW talked initially with patient, and then later with patient and daughter Susan [name] at the bedside) regarding patient's discharge disposition. Initially Susan [name] wanted to have PT work with her again today and then make her decision regarding going to [facility] rehab, however [place] talked in detail regarding her PT/OT evaluations and her inability to get out of bed and ambulate today. Patient also acknowledged that she would be home alone during the day as her husband works. CSW also answered Susan [name]'s questions regarding her insurance paying for someone (an aide) to be with her for an extended period of time during the day. Susan [name] daughter Susan [name] came later and expressed agreement with [facility] and requested a facility that is close to her home so that she can be there often. Patient then reluctantly and tearfully expressed agreement. Patient was provided with SNF list and daughter chose [facility].  Williemae Muriel Givens, MSW, LCSW Licensed Clinical Social Worker [place] [phone]

## 2017-07-27 NOTE — Progress Notes (Signed)
Physical Therapy Note   Reorder received, chart reviewed;   Noted this admission happened after she discharged home from recent admission when SNF was recommended for post-acute rehab;   DC plan as documented so far is for SNF;   As of initial PT eval on 3/1, Susan [name] required +2 Maximal assist for basic transfers;  Continue to recommend SNF for dc plan for post-acute rehab to maximize independence and safety with mobility prior to dc back home;    Plan to see Susan [name] for next  PT session tomorrow;  Thank you,  Roney Marion, [place] Pager [phone] Office [phone]

## 2017-07-27 NOTE — Discharge Summary (Addendum)
Physician Discharge Summary  Susan [name] WUX:[id number] DOB: 06-10-1950 DOA: 07/22/2017  PCP: Alycia Rossetti, MD  Admit date: 07/22/2017 Discharge date: 07/28/2017  Admitted From: Home Disposition:  SNF   Recommendations for Outpatient Follow-up:  1. Follow up with PCP in 1 week 2. Follow up with Advance Heart Failure team in 1 week 3. Recommend outpatient sleep study and weight loss  4. Please obtain BMP in 1 week   Discharge Condition: Stable CODE STATUS: Full  Diet recommendation: Heart healthy / carb modified   Brief/Interim Summary: Susan [name] is a 67 yo female with past medical history significant for DM type 2 with neuropathy, CKD stage 4, hypertension, HLD, CAD, CHF, CVA who was recently in the hospital for hyperosmolar non-ketotic state, Klebsiella UTI. At time of discharge, she refused SNF and went home. She now returns with continued falls and poor PO intake at home. She was admitted for AKI, acute metabolic encephalopathy as well as elevated troponin. Advanced heart failure team was consulted and patient underwent RHC 3/1 which showed RHF due to OHS/OSA. ABG without evidence of CO2 retention. She was resumed on a lower dose of torsemide with improvement in kidney function. She was agreeable to SNF discharge.   Discharge Diagnoses:  Principal Problem:   ARF (acute renal failure) (HCC) Active Problems:   Hyperlipemia   Anxiety and depression   Morbid obesity (HCC)   Diabetes mellitus type II, uncontrolled (HCC)   Anemia   Weakness generalized   Elevated troponin   PAH (pulmonary artery hypertension) (HCC)   CKD (chronic kidney disease) stage 4, GFR 15-29 ml/min (HCC)   UTI due to Klebsiella species   Hypothyroid   AKI on CKD stage 4 -Follows with Dr. Hollie Salk as outpatient  -Baseline Cr 2.2 -Resolved. Cr 1.78 this morning.   Acute metabolic encephalopathy  -Resolved  Acute on chronic diastolic HF, RV failure   -EF 55% -RHC 3/1 showed RHF > LHF due to  OHS/OSA  -Advanced heart failure team following -Torsemide started at lower dose, 60mg  BID   Recurrent falls -PT recommending SNF, patient more agreeable this time. SW consulted   Klebsiella UTI, POA -Sensitive to cephalosporins. Deescalate to keflex. Completed tx.   DM type 2, with hyperglycemia -Ha1c 11.4 on 07/16/17  -Continue NPH   Elevated troponin -Troponin peak at 0.26  -Advanced heart failure team following -S/p right heart cath 3/1   CAD  -Continue aspirin, plavix, coreg, isordil, crestor   Anemia of chronic disease -Baseline Hgb 9-10 -Continue iron supplement -Stable   Hypothyroidism -Continue synthroid   Depression -Continue paxil, klonopin   Morbid obesity -Body mass index is 49.64 kg/m. -Encourage weight loss   Discharge Instructions  Discharge Instructions    AMB Referral to [place] Management   Complete by:  As directed    Please assign [employer] member to [facility] LCSW for follow up at [facility]. Written consent obtained. Daughter- Myra Gianotti listed as emergency contact [phone] (on [facility] consent). Please request for [place] follow up upon discharge from SNF. High risk for readmission. Also history of multiple falls. Please call with questions. Thanks. Marthenia Rolling, [place], [facility] [phone]   Reason for consult:  Please assign to [facility] LCSW   Diagnoses of:   Heart Failure Diabetes     Expected date of contact:  1-3 days (reserved for hospital discharges)   Diet - low sodium heart healthy   Complete by:  As directed  Diet Carb Modified   Complete by:  As directed    Increase activity slowly   Complete by:  As directed      Allergies as of 07/28/2017      Reactions   Codeine Nausea And Vomiting      Medication List    STOP taking these medications   cefUROXime 500 MG tablet Commonly known as:  CEFTIN   insulin regular 100 units/mL injection Commonly  known as:  NOVOLIN R,HUMULIN R   losartan 50 MG tablet Commonly known as:  COZAAR   metolazone 2.5 MG tablet Commonly known as:  ZAROXOLYN   potassium chloride 10 MEQ tablet Commonly known as:  K-DUR     TAKE these medications   albuterol 108 (90 Base) MCG/ACT inhaler Commonly known as:  PROVENTIL HFA;VENTOLIN HFA Inhale 2 puffs into the lungs every 6 (six) hours as needed for wheezing or shortness of breath. Reported on 10/30/2015   aspirin EC 81 MG tablet Take 81 mg by mouth daily.   carvedilol 12.5 MG tablet Commonly known as:  COREG Take 1 tablet (12.5 mg total) by mouth 2 (two) times daily with a meal.   cholecalciferol 1000 units tablet Commonly known as:  VITAMIN D Take 1,000 Units by mouth daily.   clonazePAM 1 MG tablet Commonly known as:  KLONOPIN TAKE 1mg   TABLET BY MOUTH THREE TIMES A DAY What changed:    how much to take  how to take this  when to take this  additional instructions   clopidogrel 75 MG tablet Commonly known as:  PLAVIX TAKE 1 TABLET (75 MG TOTAL) BY MOUTH DAILY WITH BREAKFAST.   ferrous sulfate 325 (65 FE) MG tablet Take 325 mg by mouth daily with breakfast. Reported on 08/21/2015   insulin NPH Human 100 UNIT/ML injection Commonly known as:  HUMULIN N,NOVOLIN N Inject 90 units in the morning and 6 units at bedtime What changed:    how much to take  how to take this  when to take this  additional instructions  Another medication with the same name was removed. Continue taking this medication, and follow the directions you see here.   isosorbide dinitrate 10 MG tablet Commonly known as:  ISORDIL Take 1 tablet (10 mg total) by mouth 2 (two) times daily.   levothyroxine 50 MCG tablet Commonly known as:  SYNTHROID, LEVOTHROID Take 1 tablet (50 mcg total) by mouth daily before breakfast.   Magnesium Oxide 400 (240 Mg) MG Tabs TAKE 1 TABLET BY MOUTH EVERY DAY What changed:    how much to take  how to take this  when to  take this   nitroGLYCERIN 0.4 MG SL tablet Commonly known as:  NITROSTAT PLACE 1 TABLET (0.4 MG TOTAL) UNDER THE TONGUE EVERY 5 (FIVE) MINUTES AS NEEDED FOR CHEST PAIN.   PARoxetine 37.5 MG 24 hr tablet Commonly known as:  PAXIL-CR Take 37.5 mg by mouth daily. Take with 25 mg tablet daily (total 62.5 mg) What changed:  Another medication with the same name was removed. Continue taking this medication, and follow the directions you see here.   PARoxetine 25 MG 24 hr tablet Commonly known as:  PAXIL-CR Take 25 mg by mouth daily. Take with 37.5 mg tablet daily (total 62.5 mg) What changed:  Another medication with the same name was removed. Continue taking this medication, and follow the directions you see here.   pregabalin 150 MG capsule Commonly known as:  LYRICA Take 1 capsule (150 mg  total) by mouth 2 (two) times daily.   rosuvastatin 20 MG tablet Commonly known as:  CRESTOR Take 1 tablet (20 mg total) by mouth daily.   torsemide 20 MG tablet Commonly known as:  DEMADEX Take 3 tablets (60 mg total) by mouth 2 (two) times daily. What changed:  how much to take   vitamin E 1000 UNIT capsule Generic drug:  vitamin E Take 1,000 Units by mouth daily.       Contact information for follow-up providers    Dora, [name], MD. Schedule an appointment as soon as possible for a visit in 1 week(s).   Specialty:  Family Medicine Contact information: [address] [place] [address] [phone]        [place]. Schedule an appointment as soon as possible for a visit in 1 week(s).   Specialty:  Cardiology Contact information: [address] [id number] [place] [phone]           Contact information for after-discharge care    Destination    [facility] .   Service:  Skilled Nursing Contact information: [address] [place]  [place] [phone]                 Allergies  Allergen Reactions  . Codeine Nausea And Vomiting    Consultations:  Cardiology    Procedures/Studies: Ct Head Wo Contrast  Result Date: 07/23/2017 CLINICAL DATA:  Multiple falls, neck pain. EXAM: CT HEAD WITHOUT CONTRAST CT CERVICAL SPINE WITHOUT CONTRAST TECHNIQUE: Multidetector CT imaging of the head and cervical spine was performed following the standard protocol without intravenous contrast. Multiplanar CT image reconstructions of the cervical spine were also generated. COMPARISON:  CT scan of October 25, 2015. FINDINGS: CT HEAD FINDINGS Brain: Mild diffuse cortical atrophy is noted. 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 No mass effect or midline shift is noted. Ventricular size is within normal limits. There is no evidence of mass lesion, hemorrhage or acute infarction. Vascular: No hyperdense vessel or unexpected calcification. Skull: Normal. Negative for fracture or focal lesion. Sinuses/Orbits: No acute finding. Other: None. CT CERVICAL SPINE FINDINGS Alignment: Normal. Skull base and vertebrae: No acute fracture. No primary bone lesion or focal pathologic process. Soft tissues and spinal canal: No prevertebral fluid or swelling. No visible canal hematoma. Disc levels: Moderate degenerative disc disease is noted at C5-6 and C6-7 with anterior osteophyte formation. Upper chest: Negative. Other: Degenerative changes are seen involving the posterior facet joints bilaterally. IMPRESSION: Mild diffuse cortical atrophy. Old right cerebellar infarction. No acute intracranial abnormality seen. Multilevel degenerative disc disease. No acute abnormality seen in the cervical spine. Electronically Signed   By: Marijo Conception, M.D.   On: 07/23/2017 12:13   Ct Cervical Spine Wo Contrast  Result Date: 07/23/2017 CLINICAL DATA:  Multiple falls, neck pain. EXAM: CT HEAD WITHOUT CONTRAST CT CERVICAL SPINE WITHOUT CONTRAST TECHNIQUE:  Multidetector CT imaging of the head and cervical spine was performed following the standard protocol without intravenous contrast. Multiplanar CT image reconstructions of the cervical spine were also generated. COMPARISON:  CT scan of October 25, 2015. FINDINGS: CT HEAD FINDINGS Brain: Mild diffuse cortical atrophy is noted. 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 No mass effect or midline shift is noted. Ventricular size is within normal limits. There is no evidence of mass lesion, hemorrhage or acute infarction. Vascular: No hyperdense vessel or unexpected calcification. Skull:  Normal. Negative for fracture or focal lesion. Sinuses/Orbits: No acute finding. Other: None. CT CERVICAL SPINE FINDINGS Alignment: Normal. Skull base and vertebrae: No acute fracture. No primary bone lesion or focal pathologic process. Soft tissues and spinal canal: No prevertebral fluid or swelling. No visible canal hematoma. Disc levels: Moderate degenerative disc disease is noted at C5-6 and C6-7 with anterior osteophyte formation. Upper chest: Negative. Other: Degenerative changes are seen involving the posterior facet joints bilaterally. IMPRESSION: Mild diffuse cortical atrophy. Old right cerebellar infarction. No acute intracranial abnormality seen. Multilevel degenerative disc disease. No acute abnormality seen in the cervical spine. Electronically Signed   By: Marijo Conception, M.D.   On: 07/23/2017 12:13   US Renal  Result Date: 07/23/2017 CLINICAL DATA:  67 year old female with history of diabetes and acute renal failure. EXAM: RENAL / URINARY TRACT ULTRASOUND COMPLETE COMPARISON:  Ultrasound dated 03/24/2017 FINDINGS: Right Kidney: Length: 13.7 cm. There is mild atrophy and cortical thinning. No hydronephrosis or shadowing stone. Left Kidney: Length: 12.4 cm. Mild parenchymal atrophy and cortical thinning. No hydronephrosis or shadowing stone. Bladder: Appears normal for degree of bladder distention. IMPRESSION: Mild  atrophy and parenchymal thinning of the kidneys. No hydronephrosis or shadowing stone. Electronically Signed   By: Anner Crete M.D.   On: 07/23/2017 02:51   Dg Chest Port 1 View  Result Date: 07/16/2017 CLINICAL DATA:  Hyperglycemia and dehydration EXAM: PORTABLE CHEST 1 VIEW COMPARISON:  07/28/2016 FINDINGS: Mild bronchitic changes at the bases. No focal opacity or effusion. Stable cardiomediastinal silhouette. No pneumothorax. IMPRESSION: No active disease.  Stable mild bronchitic changes Electronically Signed   By: Donavan Foil M.D.   On: 07/16/2017 00:23   Dg Shoulder Left  Result Date: 07/15/2017 CLINICAL DATA:  [place] on floor 5 days ago. Left shoulder pain. Initial encounter. EXAM: LEFT SHOULDER - 2+ VIEW COMPARISON:  None. FINDINGS: There is no evidence of fracture or dislocation. Mild acromioclavicular joint degenerative changes. No other bone lesions identified. Soft tissues are unremarkable. IMPRESSION: No acute findings. Mild acromioclavicular DJD. Electronically Signed   By: Earle Gell M.D.   On: 07/15/2017 19:49   Dg Knee Complete 4 Views Left  Result Date: 07/17/2017 CLINICAL DATA:  Entire knee pain.  Unable to walk EXAM: LEFT KNEE - COMPLETE 4+ VIEW COMPARISON:  None. FINDINGS: Tricompartmental osteoarthritis with advanced tibiofemoral joint narrowing and subchondral sclerosis. There is generalized degenerative spurring. There is lateral translation of the tibial plateau. No evidence of bone lesion or fracture deformity. 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 Electronically Signed   By: Monte Fantasia M.D.   On: 07/17/2017 12:30    Echo 2/28 Study Conclusions  - Left ventricle: The cavity size was normal. Systolic function was   normal. The estimated ejection fraction was in the range of 55%   to 60%. Possible hypokinesis of the inferior myocardium. Doppler   parameters are consistent with abnormal left ventricular    relaxation (grade 1 diastolic dysfunction).  Impressions:  - Even after Definity contrast, the images are barely interpretable   for wall motion. Suspect inferior wall hypokinesis.   Heart cath 3/1 Findings:  RA = 17 RV = 48/15 PA = 49/11 (30) PCW = 18 Fick cardiac output/index = 7.0/3.0 PVR = 0.5 WU Ao sat = 97% PA sat = 62%, 64%  Assessment:  1. Normal left-sided pressures 2. Mild PAH with evidence of RV strain likely due to OHS/OSA 3. Normal cardiac output   Discharge Exam: Vitals:  07/28/17 0417 07/28/17 0825  BP: 136/60 (!) 128/56  Pulse: 65 68  Resp: 12 15  Temp: 97.6 F (36.4 C) 98.2 F (36.8 C)  SpO2: 95% 96%    General: Pt is alert, awake, not in acute distress Cardiovascular: RRR, S1/S2 +, no rubs, no gallops Respiratory: CTA bilaterally, no wheezing, no rhonchi Abdominal: Soft, NT, ND, bowel sounds + Extremities: +edema bilaterally, no cyanosis    The results of significant diagnostics from this hospitalization (including imaging, microbiology, ancillary and laboratory) are listed below for reference.     Microbiology: Recent Results (from the past 240 hour(s))  Urine Culture     Status: Abnormal   Collection Time: 07/22/17  8:53 PM  Result Value Ref Range Status   Specimen Description URINE, CATHETERIZED  Final   Special Requests   Final    NONE Performed at [facility], [address], [place], [place] [address]    Culture >=100,000 COLONIES/mL KLEBSIELLA PNEUMONIAE (A)  Final   Report Status 07/25/2017 FINAL  Final   Organism ID, Bacteria KLEBSIELLA PNEUMONIAE (A)  Final      Susceptibility   Klebsiella pneumoniae - MIC*    AMPICILLIN >=32 RESISTANT Resistant     CEFAZOLIN <=4 SENSITIVE Sensitive     CEFTRIAXONE <=1 SENSITIVE Sensitive     CIPROFLOXACIN <=0.25 SENSITIVE Sensitive     GENTAMICIN <=1 SENSITIVE Sensitive     IMIPENEM <=0.25 SENSITIVE Sensitive     NITROFURANTOIN 128 RESISTANT Resistant     TRIMETH/SULFA  <=20 SENSITIVE Sensitive     AMPICILLIN/SULBACTAM >=32 RESISTANT Resistant     PIP/TAZO 32 INTERMEDIATE Intermediate     Extended ESBL NEGATIVE Sensitive     * >=100,000 COLONIES/mL KLEBSIELLA PNEUMONIAE  MRSA PCR Screening     Status: None   Collection Time: 07/23/17  3:20 PM  Result Value Ref Range Status   MRSA by PCR NEGATIVE NEGATIVE Final    Comment:        The GeneXpert MRSA Assay (FDA approved for NASAL specimens only), is one component of a comprehensive MRSA colonization surveillance program. It is not intended to diagnose MRSA infection nor to guide or monitor treatment for MRSA infections. Performed at [facility], [place] [address], [place], [place] [address]      Labs: BNP (last 3 results) Recent Labs    01/13/17 1456 02/02/17 1530 04/29/17 1109  BNP 139.5* 131.8* 56.8   Basic Metabolic Panel: Recent Labs  Lab 07/24/17 0528 07/25/17 0533 07/26/17 0625 07/27/17 0538 07/28/17 0759  NA 137 139 139 139 138  K 3.8 3.9 4.1 3.9 4.2  CL 106 109 109 104 101  CO2 17* 21* 22 25 27   GLUCOSE 140* 149* 145* 185* 270*  BUN 121* 102* 89* 86* 74*  CREATININE 3.17* 2.38* 2.04* 1.83* 1.78*  CALCIUM 7.9* 8.4* 8.5* 8.8* 8.9   Liver Function Tests: Recent Labs  Lab 07/22/17 1725 07/23/17 0559 07/24/17 0528  AST 16 12* 10*  ALT 27 22 17   ALKPHOS 148* 134* 129*  BILITOT 0.4 0.4 0.3  PROT 6.1* 5.8* 5.4*  ALBUMIN 2.5* 2.4* 2.1*   No results for input(s): LIPASE, AMYLASE in the last 168 hours. Recent Labs  Lab 07/27/17 1055  AMMONIA 16   CBC: Recent Labs  Lab 07/22/17 1725 07/22/17 1745 07/23/17 0559 07/24/17 0528 07/26/17 0625  WBC 10.9*  --  8.8 6.9 5.5  NEUTROABS 9.4*  --   --   --   --  HGB 10.4* 10.9* 9.5* 9.0* 8.7*  HCT 32.0* 32.0* 29.7* 28.4* 27.4*  MCV 77.9*  --  78.0 78.5 79.7  PLT 299  --  269 271 271   Cardiac Enzymes: Recent Labs  Lab 07/22/17 1738 07/22/17 2155 07/23/17 0559 07/23/17 1003 07/23/17 1521 07/23/17 1844   CKTOTAL  --  84 63  --   --   --   CKMB  --  5.2* 3.3  --   --   --   TROPONINI 0.26*  --  0.17* 0.15* 0.12* 0.15*   BNP: Invalid input(s): POCBNP CBG: Recent Labs  Lab 07/27/17 0736 07/27/17 1201 07/27/17 1659 07/27/17 2148 07/28/17 0738  GLUCAP 206* 225* 396* 356* 262*   D-Dimer No results for input(s): DDIMER in the last 72 hours. Hgb A1c No results for input(s): HGBA1C in the last 72 hours. Lipid Profile No results for input(s): CHOL, HDL, LDLCALC, TRIG, CHOLHDL, LDLDIRECT in the last 72 hours. Thyroid function studies No results for input(s): TSH, T4TOTAL, T3FREE, THYROIDAB in the last 72 hours.  Invalid input(s): FREET3 Anemia work up No results for input(s): VITAMINB12, FOLATE, FERRITIN, TIBC, IRON, RETICCTPCT in the last 72 hours. Urinalysis    Component Value Date/Time   COLORURINE YELLOW 07/22/2017 2053   APPEARANCEUR CLOUDY (A) 07/22/2017 2053   LABSPEC 1.012 07/22/2017 2053   PHURINE 5.0 07/22/2017 2053   GLUCOSEU NEGATIVE 07/22/2017 2053   HGBUR SMALL (A) 07/22/2017 2053   BILIRUBINUR NEGATIVE 07/22/2017 2053   KETONESUR NEGATIVE 07/22/2017 2053   PROTEINUR 30 (A) 07/22/2017 2053   UROBILINOGEN 0.2 04/01/2014 1659   NITRITE NEGATIVE 07/22/2017 2053   LEUKOCYTESUR LARGE (A) 07/22/2017 2053   Sepsis Labs Invalid input(s): PROCALCITONIN,  WBC,  LACTICIDVEN Microbiology Recent Results (from the past 240 hour(s))  Urine Culture     Status: Abnormal   Collection Time: 07/22/17  8:53 PM  Result Value Ref Range Status   Specimen Description URINE, CATHETERIZED  Final   Special Requests   Final    NONE Performed at [facility], [address], [place], [place] [address]    Culture >=100,000 COLONIES/mL KLEBSIELLA PNEUMONIAE (A)  Final   Report Status 07/25/2017 FINAL  Final   Organism ID, Bacteria KLEBSIELLA PNEUMONIAE (A)  Final      Susceptibility   Klebsiella pneumoniae - MIC*    AMPICILLIN >=32 RESISTANT Resistant     CEFAZOLIN <=4  SENSITIVE Sensitive     CEFTRIAXONE <=1 SENSITIVE Sensitive     CIPROFLOXACIN <=0.25 SENSITIVE Sensitive     GENTAMICIN <=1 SENSITIVE Sensitive     IMIPENEM <=0.25 SENSITIVE Sensitive     NITROFURANTOIN 128 RESISTANT Resistant     TRIMETH/SULFA <=20 SENSITIVE Sensitive     AMPICILLIN/SULBACTAM >=32 RESISTANT Resistant     PIP/TAZO 32 INTERMEDIATE Intermediate     Extended ESBL NEGATIVE Sensitive     * >=100,000 COLONIES/mL KLEBSIELLA PNEUMONIAE  MRSA PCR Screening     Status: None   Collection Time: 07/23/17  3:20 PM  Result Value Ref Range Status   MRSA by PCR NEGATIVE NEGATIVE Final    Comment:        The GeneXpert MRSA Assay (FDA approved for NASAL specimens only), is one component of a comprehensive MRSA colonization surveillance program. It is not intended to diagnose MRSA infection nor to guide or monitor treatment for MRSA infections. Performed at [facility], [place] [address] [place], [place] [address]      Patient was seen and examined  on the day of discharge and was found to be in stable condition. Time coordinating discharge: 40 minutes including assessment and coordination of care, as well as examination of the patient.   SIGNED:  Dessa Phi, DO [facility] Pager [phone]  If 7PM-7AM, please contact night-coverage [email or web address] Password [facility] 07/28/2017, 9:38 AM

## 2017-07-27 NOTE — Consult Note (Addendum)
   [facility] CM Inpatient Consult   07/27/2017  Susan [name] April 01, 1951 [id number]    Patient screened for [place] Management services due to hospital readmission.   Spoke with [name] and her daughter, Myra Gianotti, at bedside about [place] Management program. [name] is agreeable and [facility] Care Management written consent obtained. [facility] Care Management packet provided as well.   Both patient and daughter endorse the discharge plan is for SNF.  Discussed [facility] Care Management LCSW follow up while at SNF. [name] is agreeable to this.   Explained [facility] Care Management will not interfere or replace services provided by the SNF facility or with home health when she eventually returns home.  [name] lives alone primarily. Her son, Wille Glaser, lives there but is unable to assist [name] due to his own disablities, per [name] (daughter). The daughter also reports that [name] has had multiple falls at home and is unsafe to be home.  Confirmed Primary Care MD is Dr. [name]. Confirmed best contact number as [phone] for [name]. She states she rarely uses her cell phone. Myra Gianotti (daughter) [phone] agrees to be contacted in case patient is unable to be reached. She is also listed as emergency contact on [place] Management consent. [name] is agreeable to this.  Spoke with inpatient LCSW to make aware [name] to follow. Made aware that patient will discharge to [facility].   Will make referral to [facility] LCSW.  [name] has a history of DM type 2 with neuropathy, CKD stage 4, hypertension, HLD, CAD, CHF, CVA. Daughter reports patient has had multiple falls at home.     Marthenia Rolling, MSN-Ed, RN,BSN [facility] Liaison [phone]

## 2017-07-27 NOTE — Care Management Important Message (Signed)
Important Message  Patient Details  Name: Susan [name] MRN: [id number] Date of Birth: [birth year]   Medicare Important Message Given:  Yes    [name] [name] 07/27/2017, 3:48 PM

## 2017-07-27 NOTE — Patient Outreach (Signed)
Assessment:  CSW [name] received referral on Susan [name] on 07/27/17. CSW completed chart review on client on 07/27/17. Client is planning to discharge from [facility] on 07/28/17 to go to [facility] in [place], [place].  [place] spoke via phone with client on 07/27/17.  CSW verified client identity. CSW received verbal permission from client on 07/27/17 for CSW to speak with client about client needs and status.  Client has support from her daughter.  Client normally receives medical care from Dr. [name] when client is residing in the community.  Client said she is having difficulty moving her legs. She has not been able to walk recently. She is hoping to receive nursing care and physical therapy support as needed at [facility] facility.  CSW and client completed needed [facility] client assessments.  Client said she has had several falls recently but has not had any serious injuries from these falls.  She is somewhat hesitant to go to skilled nursing facility but realizes she needs nursing  facility care at present.  CSW spoke with client about client care plan. CSW encouraged client to participate in scheduled client physical therapy sessions, as scheduled, when client is at [facility] facility. CSW have given client [facility] CSW number of [phone].  CSW encouraged client to call CSW as needed to discuss social work needs of client. Client was appreciative of call from [place] on 07/27/17    Plan:  Client to participate in scheduled client physical therapy sessions, as scheduled in next 30 days, for client at [facility] facility .   CSW to call client in 2 weeks to assess client needs at that time.  [name].Aamirah Salmi MSW, LCSW Licensed Clinical Social Worker [facility] Care Management [phone]

## 2017-07-27 NOTE — Progress Notes (Signed)
Discussed with SW, no SNF bed offer today. Await SNF placement tomorrow AM. Will cancel DC order.   Dessa Phi, DO [facility] [email or web address] Password [email or web address] 07/27/2017, 5:13 PM

## 2017-07-27 NOTE — Progress Notes (Signed)
Physical Therapy Treatment Patient Details Name: Susan [name] MRN: [id number] DOB: Sep 16, 1950 Today's Date: 07/27/2017    History of Present Illness Pt is a 67 y/o female admitted on 07/22/17 due to falls and trouble swallowing. Pt was hospitalized 07/16/08 to 07/19/17 with hyperglycemia with uncontrolled DM. SNF was recommended for rehab, but pt refused.  PMH including but not limited to CHF, CAD status post bare-metal stent placement, hypothyroidism, chronic kidney disease and chronic anemia.    PT Comments    Able to come and work with Susan [name] today, Continuing work on functional mobility and activity tolerance; Still needing Maximal Assistance for Basic transfers; We had a good discussion re: dc plan for SNF for post-acute rehab to maximize independence and safety with mobility priro to dc home; She is showing progress compared to initial PT eval, and I stressed that continued consistent therapy at SNF is her best option to get stronger, and more independent with mobility and ADLs to be able to manage safely at home; During this session, Susan [name] agreed to SNF for dc plan   Follow Up Recommendations  SNF;Supervision/Assistance - 24 hour     Equipment Recommendations  None recommended by PT;Other (comment)(defer to next venue)    Recommendations for Other Services       Precautions / Restrictions Precautions Precautions: Fall Restrictions Weight Bearing Restrictions: No    Mobility  Bed Mobility Overal bed mobility: Needs Assistance Bed Mobility: Rolling;Sidelying to Sit;Sit to Supine Rolling: Max assist;Min assist Sidelying to sit: Mod assist   Sit to supine: Max assist   General bed mobility comments: Initial roll to L side for getting EOB requiring max assist and use of bed pad; heavy mod assist to elevate trunk for sidelying to sit; Max assist to assist LEs into bed; after she was awakened with EOB activity, able to roll Right and Left with min assist for bed  positioning  Transfers Overall transfer level: Needs assistance Equipment used: Rolling walker (2 wheeled) Transfers: Sit to/from Stand;Lateral/Scoot Transfers Sit to Stand: Max assist        Lateral/Scoot Transfers: Max assist General transfer comment: Unable to successfully stand with attempts today form slightly elevated bed; max assist and use of bed pad to lateral scoot to head of bed (moved minimally)  Ambulation/Gait                 Stairs            Wheelchair Mobility    Modified Rankin (Stroke Patients Only)       Balance     Sitting balance-Leahy Scale: Fair       Standing balance-Leahy Scale: Zero                              Cognition Arousal/Alertness: Awake/alert(Initially sleepy but woke up enough to participate) Behavior During Therapy: WFL for tasks assessed/performed Overall Cognitive Status: Impaired/Different from baseline Area of Impairment: Safety/judgement                         Safety/Judgement: Decreased awareness of safety;Decreased awareness of deficits            Exercises      General Comments        Pertinent Vitals/Pain Pain Assessment: Faces Faces Pain Scale: Hurts little more Pain Location: Did not specify; Grimace likely related ot effort Pain Descriptors / Indicators: Grimacing Pain Intervention(s): Monitored during  session    Home Living                      Prior Function            PT Goals (current goals can now be found in the care plan section) Acute Rehab PT Goals Patient Stated Goal: to get stronger PT Goal Formulation: With patient Time For Goal Achievement: 08/07/17 Potential to Achieve Goals: Fair Progress towards PT goals: Progressing toward goals(Slowly)    Frequency    Min 2X/week      PT Plan Current plan remains appropriate    Co-evaluation              AM-PAC PT "6 Clicks" Daily Activity  Outcome Measure  Difficulty turning  over in bed (including adjusting bedclothes, sheets and blankets)?: A Lot Difficulty moving from lying on back to sitting on the side of the bed? : Unable Difficulty sitting down on and standing up from a chair with arms (e.g., wheelchair, bedside commode, etc,.)?: Unable Help needed moving to and from a bed to chair (including a wheelchair)?: Total Help needed walking in hospital room?: Total Help needed climbing 3-5 steps with a railing? : Total 6 Click Score: 7    End of Session Equipment Utilized During Treatment: Gait belt(bed pad) Activity Tolerance: Patient tolerated treatment well;Patient limited by fatigue Patient left: in bed;with call bell/phone within reach;Other (comment)(preparing for bath with [place], NT) Nurse Communication: Mobility status;Need for lift equipment PT Visit Diagnosis: Other abnormalities of gait and mobility (R26.89);Muscle weakness (generalized) (M62.81);Repeated falls (R29.6)     Time: 9244-6286 PT Time Calculation (min) (ACUTE ONLY): 22 min  Charges:  $Therapeutic Activity: 8-22 mins                    G Codes:       Roney Marion, PT  Acute Rehabilitation Services Pager [phone] Office [place] 07/27/2017, 4:26 PM

## 2017-07-28 DIAGNOSIS — E669 Obesity, unspecified: Secondary | ICD-10-CM | POA: Diagnosis not present

## 2017-07-28 DIAGNOSIS — R2681 Unsteadiness on feet: Secondary | ICD-10-CM | POA: Diagnosis not present

## 2017-07-28 DIAGNOSIS — I50810 Right heart failure, unspecified: Secondary | ICD-10-CM | POA: Diagnosis not present

## 2017-07-28 DIAGNOSIS — E1165 Type 2 diabetes mellitus with hyperglycemia: Secondary | ICD-10-CM | POA: Diagnosis not present

## 2017-07-28 DIAGNOSIS — D649 Anemia, unspecified: Secondary | ICD-10-CM | POA: Diagnosis not present

## 2017-07-28 DIAGNOSIS — N179 Acute kidney failure, unspecified: Secondary | ICD-10-CM | POA: Diagnosis not present

## 2017-07-28 DIAGNOSIS — F329 Major depressive disorder, single episode, unspecified: Secondary | ICD-10-CM | POA: Diagnosis not present

## 2017-07-28 DIAGNOSIS — Z9181 History of falling: Secondary | ICD-10-CM | POA: Diagnosis not present

## 2017-07-28 DIAGNOSIS — R278 Other lack of coordination: Secondary | ICD-10-CM | POA: Diagnosis not present

## 2017-07-28 DIAGNOSIS — R488 Other symbolic dysfunctions: Secondary | ICD-10-CM | POA: Diagnosis not present

## 2017-07-28 DIAGNOSIS — R4182 Altered mental status, unspecified: Secondary | ICD-10-CM | POA: Diagnosis not present

## 2017-07-28 DIAGNOSIS — E039 Hypothyroidism, unspecified: Secondary | ICD-10-CM | POA: Diagnosis not present

## 2017-07-28 DIAGNOSIS — R5381 Other malaise: Secondary | ICD-10-CM | POA: Diagnosis not present

## 2017-07-28 DIAGNOSIS — R262 Difficulty in walking, not elsewhere classified: Secondary | ICD-10-CM | POA: Diagnosis not present

## 2017-07-28 LAB — BASIC METABOLIC PANEL
Anion gap: 10 (ref 5–15)
BUN: 74 mg/dL — ABNORMAL HIGH (ref 6–20)
CO2: 27 mmol/L (ref 22–32)
Calcium: 8.9 mg/dL (ref 8.9–10.3)
Chloride: 101 mmol/L (ref 101–111)
Creatinine, Ser: 1.78 mg/dL — ABNORMAL HIGH (ref 0.44–1.00)
GFR calc Af Amer: 33 mL/min — ABNORMAL LOW (ref >60)
GFR calc non Af Amer: 28 mL/min — ABNORMAL LOW (ref >60)
Glucose, Bld: 270 mg/dL — ABNORMAL HIGH (ref 65–99)
Potassium: 4.2 mmol/L (ref 3.5–5.1)
Sodium: 138 mmol/L (ref 135–145)

## 2017-07-28 LAB — GLUCOSE, CAPILLARY
Glucose-Capillary: 262 mg/dL — ABNORMAL HIGH (ref 65–99)
Glucose-Capillary: 334 mg/dL — ABNORMAL HIGH (ref 65–99)
Glucose-Capillary: 451 mg/dL — ABNORMAL HIGH (ref 65–99)

## 2017-07-28 NOTE — Clinical Social Work Placement (Signed)
   CLINICAL SOCIAL WORK PLACEMENT  NOTE 07/28/17 - DISCHARGED TO [facility]IA AMBULANCE  Date:  07/28/2017  Patient Details  Name: Susan [name] MRN: [id number] Date of Birth: 15-Jul-1950  Clinical Social Work is seeking post-discharge placement for this patient at the [place] level of care (*CSW will initial, date and re-position this form in  chart as items are completed):  Yes   Patient/family provided with [place] Work Department's list of facilities offering this level of care within the geographic area requested by the patient (or if unable, by the patient's family).  Yes   Patient/family informed of their freedom to choose among providers that offer the needed level of care, that participate in Medicare, Medicaid or managed care program needed by the patient, have an available bed and are willing to accept the patient.  Yes   Patient/family informed of [facility]'s ownership interest in [facility] and [facility], as well as of the fact that they are under no obligation to receive care at these facilities.  PASRR submitted to EDS on 07/23/17     PASRR number received on 07/27/17([id number] E, eff. 3/4 - 08/26/17)     Existing PASRR number confirmed on       FL2 transmitted to all facilities in geographic area requested by pt/family on 06/22/18     FL2 transmitted to all facilities within larger geographic area on       Patient informed that his/her managed care company has contracts with or will negotiate with certain facilities, including the following:        Yes   Patient/family informed of bed offers received.  Patient chooses bed at [facility]     Physician recommends and patient chooses bed at      Patient to be transferred to [facility] on 07/28/17.  Patient to be transferred to facility by ambulance     Patient family notified on 07/28/17 of transfer.  Name of family member notified:  Daughter Myra Gianotti  [phone])     PHYSICIAN      Additional Comment:  07/28/17 - Insurance auth received from [employer] [phone] for 10 days.   _______________________________________________ Sable Feil, LCSW 07/28/2017, 7:17 PM

## 2017-07-28 NOTE — Progress Notes (Signed)
Occupational Therapy Treatment Patient Details Name: Susan [name] MRN: [id number] DOB: 1951-01-05 Today's Date: 07/28/2017    History of present illness Pt is a 67 y/o female admitted on 07/22/17 due to falls and trouble swallowing. Pt was hospitalized 07/16/08 to 07/19/17 with hyperglycemia with uncontrolled DM. SNF was recommended for rehab, but pt refused.  PMH including but not limited to CHF, CAD status post bare-metal stent placement, hypothyroidism, chronic kidney disease and chronic anemia.   OT comments  Pt with anxiety with regard to discharging for rehab to SNF. Educated pt on what she can expect and its benefits. Pt performed mobility with moderate assist and groomed seated at side of bed with min assist. Did not attempt OOB due to impending discharge.   Follow Up Recommendations  SNF;Supervision/Assistance - 24 hour    Equipment Recommendations       Recommendations for Other Services      Precautions / Restrictions Precautions Precautions: Fall       Mobility Bed Mobility Overal bed mobility: Needs Assistance Bed Mobility: Rolling;Sidelying to Sit;Sit to Supine Rolling: Mod assist Sidelying to sit: Mod assist   Sit to supine: Mod assist   General bed mobility comments: assist of pad to roll onto L side and moderate assist and increased time to raise trunk, assist for LEs back into bed, cues for sequencing  Transfers                 General transfer comment: did not attempt, pt to d/c to SNF this pm    Balance Overall balance assessment: Needs assistance Sitting-balance support: Feet supported Sitting balance-Leahy Scale: Fair Sitting balance - Comments: able to participate in ADL at EOB without support                                   ADL either performed or assessed with clinical judgement   ADL Overall ADL's : Needs assistance/impaired     Grooming: Oral care;Brushing hair;Wash/dry face;Sitting;Minimal assistance                                  General ADL Comments: pt very anxious about impending d/c to SNF for rehab, discussed benefits of increasing level of intensity with goal of pt returning home     Vision       Perception     Praxis      Cognition Arousal/Alertness: Awake/alert Behavior During Therapy: Anxious Overall Cognitive Status: Impaired/Different from baseline Area of Impairment: Memory;Problem solving                     Memory: Decreased short-term memory       Problem Solving: Difficulty sequencing;Requires verbal cues          Exercises     Shoulder Instructions       General Comments      Pertinent Vitals/ Pain       Pain Assessment: No/denies pain  Home Living                                          Prior Functioning/Environment              Frequency  Min 2X/week        Progress  Toward Goals  OT Goals(current goals can now be found in the care plan section)  Progress towards OT goals: Progressing toward goals  Acute Rehab OT Goals Patient Stated Goal: to get stronger OT Goal Formulation: With patient Time For Goal Achievement: 08/07/17 Potential to Achieve Goals: [place] Discharge plan remains appropriate    Co-evaluation                 AM-PAC PT "6 Clicks" Daily Activity     Outcome Measure   Help from another person eating meals?: None Help from another person taking care of personal grooming?: A Little Help from another person toileting, which includes using toliet, bedpan, or urinal?: Total Help from another person bathing (including washing, rinsing, drying)?: A Lot Help from another person to put on and taking off regular upper body clothing?: A Lot Help from another person to put on and taking off regular lower body clothing?: Total 6 Click Score: 13    End of Session    OT Visit Diagnosis: History of falling (Z91.81);Muscle weakness (generalized) (M62.81);Pain;Other symptoms  and signs involving cognitive function   Activity Tolerance Patient tolerated treatment well   Patient Left in bed;with call bell/phone within reach;with chair alarm set   Nurse Communication          Time: 530 [id number] OT Time Calculation (min): 17 min  Charges: OT General Charges $OT Visit: 1 Visit OT Treatments $Self Care/Home Management : 8-22 mins  07/28/2017 Nestor Lewandowsky, OTR/L Pager: [phone]   Werner Lean, Haze Boyden 07/28/2017, 2:40 PM

## 2017-07-28 NOTE — Progress Notes (Signed)
Report given to [facility].

## 2017-07-28 NOTE — Progress Notes (Signed)
Patient remains stable for DC today to SNF. Spoke with patient regarding PT recommendations, and concern for readmission if she were to refuse and go home. She is agreeable for SNF today. Cr repeated 1.78. No complaints of chest pain or shortness of breath. DC summary updated.    Dessa Phi, DO [facility] [email or web address] Password [facility] 07/28/2017, 9:38 AM

## 2017-07-29 DIAGNOSIS — E669 Obesity, unspecified: Secondary | ICD-10-CM | POA: Diagnosis not present

## 2017-07-29 DIAGNOSIS — E1165 Type 2 diabetes mellitus with hyperglycemia: Secondary | ICD-10-CM | POA: Diagnosis not present

## 2017-07-29 DIAGNOSIS — I50810 Right heart failure, unspecified: Secondary | ICD-10-CM | POA: Diagnosis not present

## 2017-07-29 DIAGNOSIS — R5381 Other malaise: Secondary | ICD-10-CM | POA: Diagnosis not present

## 2017-07-31 ENCOUNTER — Other Ambulatory Visit: Payer: Self-pay | Admitting: *Deleted

## 2017-07-31 ENCOUNTER — Other Ambulatory Visit: Payer: Self-pay | Admitting: Licensed Clinical Social Worker

## 2017-07-31 ENCOUNTER — Inpatient Hospital Stay: Payer: PPO | Admitting: Family Medicine

## 2017-07-31 NOTE — Patient Outreach (Signed)
[facility]) Care Management  [facility] Social Work  07/31/2017  Susan [name] January 12, 1951 [id number]  Subjective:    Objective:   Encounter Medications:  Outpatient Encounter Medications as of 07/31/2017  Medication Sig  . albuterol (PROVENTIL HFA;VENTOLIN HFA) 108 (90 BASE) MCG/ACT inhaler Inhale 2 puffs into the lungs every 6 (six) hours as needed for wheezing or shortness of breath. Reported on 10/30/2015  . aspirin EC 81 MG tablet Take 81 mg by mouth daily.  . carvedilol (COREG) 12.5 MG tablet Take 1 tablet (12.5 mg total) by mouth 2 (two) times daily with a meal.  . cholecalciferol (VITAMIN D) 1000 units tablet Take 1,000 Units by mouth daily.  . clonazePAM (KLONOPIN) 1 MG tablet TAKE 1mg   TABLET BY MOUTH THREE TIMES A DAY  . clopidogrel (PLAVIX) 75 MG tablet TAKE 1 TABLET (75 MG TOTAL) BY MOUTH DAILY WITH BREAKFAST.  . ferrous sulfate 325 (65 FE) MG tablet Take 325 mg by mouth daily with breakfast. Reported on 08/21/2015  . insulin NPH Human (HUMULIN N,NOVOLIN N) 100 UNIT/ML injection Inject 90 units in the morning and 6 units at bedtime  . isosorbide dinitrate (ISORDIL) 10 MG tablet Take 1 tablet (10 mg total) by mouth 2 (two) times daily.  Marland Kitchen levothyroxine (SYNTHROID, LEVOTHROID) 50 MCG tablet Take 1 tablet (50 mcg total) by mouth daily before breakfast.  . Magnesium Oxide 400 (240 Mg) MG TABS TAKE 1 TABLET BY MOUTH EVERY DAY (Patient taking differently: TAKE 240 mg TABLET BY MOUTH EVERY DAY)  . nitroGLYCERIN (NITROSTAT) 0.4 MG SL tablet PLACE 1 TABLET (0.4 MG TOTAL) UNDER THE TONGUE EVERY 5 (FIVE) MINUTES AS NEEDED FOR CHEST PAIN.  Marland Kitchen PARoxetine (PAXIL-CR) 25 MG 24 hr tablet Take 25 mg by mouth daily. Take with 37.5 mg tablet daily (total 62.5 mg)  . PARoxetine (PAXIL-CR) 37.5 MG 24 hr tablet Take 37.5 mg by mouth daily. Take with 25 mg tablet daily (total 62.5 mg)  . pregabalin (LYRICA) 150 MG capsule Take 1 capsule (150 mg total) by mouth 2 (two) times daily.  .  rosuvastatin (CRESTOR) 20 MG tablet Take 1 tablet (20 mg total) by mouth daily.  Marland Kitchen torsemide (DEMADEX) 20 MG tablet Take 3 tablets (60 mg total) by mouth 2 (two) times daily.  . vitamin E (VITAMIN E) 1000 UNIT capsule Take 1,000 Units by mouth daily.   No facility-administered encounter medications on file as of 07/31/2017.     Functional Status:  In your present state of health, do you have any difficulty performing the following activities: 07/27/2017 07/23/2017  Hearing? N -  Vision? N -  Difficulty concentrating or making decisions? Y -  Walking or climbing stairs? Y -  Dressing or bathing? Y -  Doing errands, shopping? Susan [name]  Preparing Food and eating ? Y -  Using the Toilet? N -  In the past six months, have you accidently leaked urine? N -  Do you have problems with loss of bowel control? N -  Managing your Medications? Y -  Managing your Finances? Y -  Housekeeping or managing your Housekeeping? Y -  Some recent data might be hidden    Fall/Depression Screening:  PHQ 2/9 Scores 07/27/2017 06/02/2017 02/27/2017 02/11/2017 01/13/2017 01/12/2017 12/31/2016  PHQ - 2 Score 4 4 6  0 0 0 0  PHQ- 9 Score 13 12 19  - - 9 -    Assessment:   CSW traveled to [facility] facility in [place], [place] on 07/31/17 to visit  client. CSW met with client on 07/31/17 at client's room at [facility] facility. Almetta Lovely, Geriatric Nurse Practitioner,  also attended visit along with CSW to see client at [facility] facility. Client said she had received physical therapy session and occupational therapy session. She said she does have some pain in her lower extremities. She said she hopes to be able to practice walking soon along with physical therapist support. CSW spoke with client about long term care plan for client. CSW shared with [place] that CSW had spoken via phone recently with Dr. Logan Bores with [facility]. Dr. Logan Bores was concerned regarding client long term care plan. Dr. Logan Bores did  not think that an Assisted Living Facility would be adequate for client needs. Dr. Logan Bores spoke of long term care possibly for client at skilled nursing faciltiy versus going home with 24 hour supervision or assistance in the home. CSW shared this information above with client io 07/31/17. Client said that her daughter can stay with client at home of clinet on Mondays and Tuesdays. Clent said her husband does work Psychologist, counselling but returns home from work about 5:30 PM each day.  Client  said she had a regular walker; she said she had a rollator walker. She said she did not have a wheelchair at home at present. Client expressed desire to return home with her family and her pets, when client is able to do so.   CSW encouraged [place] to talk with facility social worker to develop client discharge plan.  Almetta Lovely talked with client about nursing needs of client and medical tests client had previously had completed.  Client was alert, oriented and well groomed and sitting in a wheelchair in her room during visit. CSW talked with client about client care plan. CSW encouraged client to participate in all scheduled client physical therapy sessions for client in next 30 days at nursing facility.      Plan:   Client to participate in all scheduled client physical therapy sessions for client in next 30 days at nursing facility.   CSW to collaborate with Almetta Lovely, Geriatric Nurse Practitioner, in monitoring needs of client.  CSW to call client in two weeks to assess client needs and status at that time.  Kelton Pillar.Mccauley Diehl MSW, LCSW Licensed Clinical Social Worker [facility] Care Management [phone]

## 2017-07-31 NOTE — Patient Outreach (Signed)
Inpatient SNF visit. Pt is seated in a wheel chair. [name], LCSW, introduces [name] and questions Susan [name] about her status, how she feels, what happened in the hospital, why she is in the SNF.  Pt reports she was falling a lot for an unknown reason. She said her blood pressure had been very low. She had been taking frequent NTG for angina. She also reports her falls seemed to be related to changing position.  She resides in her home with her husband. She would like to go home.  There is a pet ramp for a dog but highly unlikely this can support her in her wheelchair.  We discussed her home situation and her ability to return safely. We are unsure at this time if she is able to ambulate or if she will have to plan on moving about in a wheelchair. If the latter a safe ramp will have to be installed and she will have to move her bed to the living area as her wheelchair will not fit through the doorway. Advised that she will have to consider all of these things before returning to her home. We briefly discussed ALF which is not her desire at this time.  I also spoke briefly to her PT and emphasized that walking safely is her priority to go home.  I will keep in touch with pt and with our LCSW who is involved, [name], to assist for her discharge plan.  [name]. [name], MSN, [facility] Gerontological Nurse Practitioner [facility] Care Management [phone]

## 2017-08-05 ENCOUNTER — Encounter (HOSPITAL_COMMUNITY): Payer: Medicare Other

## 2017-08-06 ENCOUNTER — Other Ambulatory Visit: Payer: Self-pay | Admitting: *Deleted

## 2017-08-06 DIAGNOSIS — E039 Hypothyroidism, unspecified: Secondary | ICD-10-CM | POA: Diagnosis not present

## 2017-08-06 DIAGNOSIS — D649 Anemia, unspecified: Secondary | ICD-10-CM | POA: Diagnosis not present

## 2017-08-06 DIAGNOSIS — E1165 Type 2 diabetes mellitus with hyperglycemia: Secondary | ICD-10-CM | POA: Diagnosis not present

## 2017-08-06 NOTE — Patient Outreach (Signed)
Called pt to follow up on her progress in rehab and to see if she can help me contact her husband for a safety check before she is discharged. I left my number and requested a call back.  Susan [name]. [name], MSN, [facility] Gerontological Nurse Practitioner [facility] Care Management [phone]

## 2017-08-07 ENCOUNTER — Other Ambulatory Visit: Payer: Self-pay | Admitting: *Deleted

## 2017-08-07 ENCOUNTER — Encounter (HOSPITAL_COMMUNITY): Payer: Medicare Other

## 2017-08-07 NOTE — Patient Outreach (Signed)
Mr. Renville called me this morning and we had a very agreeable conversation. I advised me of my role in making sure her home environment would be safe for her return and he agreed. We will meet at [facility] on March 18th for her care plan meeting and then I will follow Mr. Bonanno home to inspect the ramp and living area for safety and logistics of getting around in a wide wheel chair.  I have called the facility to advise of same and Scott Forrest, LCSW.  [name]. [name], MSN, [facility] Gerontological Nurse Practitioner [facility] Care Management [phone]

## 2017-08-10 ENCOUNTER — Other Ambulatory Visit: Payer: Self-pay | Admitting: Licensed Clinical Social Worker

## 2017-08-10 NOTE — Patient Outreach (Signed)
Assessment:  CSW spoke via phone with client on 08/10/17.  CSW verified client identity. CSW received verbal permission from client on 08/10/17 for CSW to speak with client about client needs. Client is receiving care at [facility]. Client said she still is hoping to be able to return to her home with supports in place. Client said that care planning/discharge meeting for client is to be held at facility tomorrow. She said that Deloria Lair, Nurse Practitioner, plans to attend meeting. She also said that her spouse plans to attend meeting as well. Deloria Lair had informed CSW previously that following this meeting on 08/11/17 that Kayleen Memos and Mr. Reich, spouse of client, would meet at home of client for home inspection to allow Kayleen Memos to see ramp at home of client and to see home living environment of client. Client is aware of these plans.  Client said she is walking short distances with use of a walker.  CSW asked Jackalynn if her ramp at home was wide enough for a wheelchair to use. She said she was not sure if ramp was wide enough for a wheelchair.  CSW talked with Khadija about her care plan. CSW encouraged Raeli to continue to participate in scheduled client physical therapy sessions for client in next 30 days at nursing center. Client said she was not sure how many more days she would receive care at nursing facility. CSW thanked Louvinia for phone call with CSW. CSW encouraged her to call CSW at [phone] as needed to discuss social work needs of client.  CSW have encouraged client to talk with facility social worker to finalize client discharge plans.   Plan:  Client to continue to participate in scheduled client physical therapy sessions for client in the next 30 days at nursing center.   CSW to collaborate with Deloria Lair, Nurse Practitioner, in monitoring needs of client.  CSW to call client in 2 weeks to assess client needs.  [name].Coron Rossano MSW, LCSW Licensed  Clinical Social Worker [facility] Care Management [phone]

## 2017-08-11 ENCOUNTER — Other Ambulatory Visit: Payer: Self-pay | Admitting: *Deleted

## 2017-08-12 NOTE — Patient Outreach (Addendum)
Today there was a care plan meeting to discuss [name]'s progress and discharge plan. Present were [name], their son, LCSW and PT and myself.  [name] is able to transfer ambulate with assistance. Other wise she sits in her wheel chair. She has difficulty standing from a sitting position.  Her discharge is temporarily scheduled for next Tuesday but I don't think she will be ready and I will advise the UM team.  I went to pt's home after the meeting with her husband to assess the safety of their home. There is a ramp and it will accommodate getting pt in the home in her wheel chair or by ambulation. I believe it may be a bit steeper than regulation. Mr. Susan [name] is confident that he can handle his wife in the w/c. The inside of their double wide is very spacious in the living area. The hallway has a door frame which needs to be removed for the w/c to go through. The shower is small and a change of the glass door and adding a longer, wider bench seat would be ideal.  Plan: Husband to Obtain gait belt - we measured for size           Husband is to take down door frame in hallway of their home.            He will also see if he can find a different shower door to accommodate Mrs.                    Susan [name] better and a larger bench seat for the shower.             I will order pt 2 pairs of support stockings.  Scott Forrest, LCSW to continue to follow. Will request CCM to be assigned for when she is discharged for ongoing safety issues and teaching on HF.  [name]. [name], MSN, [facility] Gerontological Nurse Practitioner [facility] Care Management [phone]

## 2017-08-14 ENCOUNTER — Ambulatory Visit: Payer: Self-pay | Admitting: Licensed Clinical Social Worker

## 2017-08-14 DIAGNOSIS — F329 Major depressive disorder, single episode, unspecified: Secondary | ICD-10-CM | POA: Diagnosis not present

## 2017-08-15 ENCOUNTER — Other Ambulatory Visit (HOSPITAL_COMMUNITY): Payer: Self-pay | Admitting: Cardiology

## 2017-08-20 DIAGNOSIS — G609 Hereditary and idiopathic neuropathy, unspecified: Secondary | ICD-10-CM | POA: Diagnosis not present

## 2017-08-20 DIAGNOSIS — M6281 Muscle weakness (generalized): Secondary | ICD-10-CM | POA: Diagnosis not present

## 2017-08-20 DIAGNOSIS — E1165 Type 2 diabetes mellitus with hyperglycemia: Secondary | ICD-10-CM | POA: Diagnosis not present

## 2017-08-20 DIAGNOSIS — I10 Essential (primary) hypertension: Secondary | ICD-10-CM | POA: Diagnosis not present

## 2017-08-20 DIAGNOSIS — E039 Hypothyroidism, unspecified: Secondary | ICD-10-CM | POA: Diagnosis not present

## 2017-08-25 ENCOUNTER — Other Ambulatory Visit: Payer: Self-pay | Admitting: Licensed Clinical Social Worker

## 2017-08-25 ENCOUNTER — Telehealth: Payer: Self-pay | Admitting: *Deleted

## 2017-08-25 ENCOUNTER — Other Ambulatory Visit: Payer: Self-pay | Admitting: *Deleted

## 2017-08-25 NOTE — Patient Outreach (Signed)
Susan [name]) Care Management  08/25/2017  Linnell Swords 01-Sep-1950 [id number]   [name] is a 67 year old female patient who was hospitalized from 07/22/17-07/28/17 for treatment of acute renal failure/acute metabolic encephalopathy/right heart failure until her discharge to [facility] facility for rehabilitation. Susan [name] has a past medical history which inlcudes DM type 2 with neuropathy, CKD Stage 4, hypertension, hyperlipidemia, coronary artery disease, congestive heart failure, CVA, and [name] admission for treatment of hyperosmolar non-ketotic state and Klebsiella UTI from 07/15/17-07/19/17.   Discharge Planning/GNP Engagement - Susan [name] was engaged by Deloria Lair GNP ([facility]) at the skilled nursing facility when Susan [name] attended the discharge planning conference. Thereafter, Susan [name] visited the home of Susan [name] to assess the home setting/safety in anticipation of her discharge. Of note from Susan [name]' engagement, Susan [name] husband agreed to the following interventions to better prepare the home for Susan [name]'s return:   1. Obtain gait belt (measured for size by Susan [name]) 2. Take Down door frame in hallway to better/safely accommodate wheelchair/patient 3. Attempt to find a different shower door to better accommodate Susan [name]  4. Attempt to obtain a larger bench seat for the shower 5. Have on hand 2 pairs of support stockings (ordered by Susan [name])  Today, Susan [name] reports that her husband was able to get the gait belt and she has been using it. However, the other recommendations as outlined above have not been accomplished.   Social Work Airline pilot - Susan [name] was engaged by Mr. [name] LCSW who has been following her progress at the nursing facility and referred Susan [name] today to the community case management team for assistance with transition of care services. Of note from Mr. Forrest's discussion  with Susan [name] today:   1. Support/caregiver network: spouse, daughter, son who is currently staying in the home to provide extra needed help as Mr. Gaylyn [name] works during the day and typically returns home from work around [employer] PM daily.  2. [facility] - Susan [name] confirmed that a  HHRN, [place], &  HHOT are seeing her 3 times weekly at home. She reports that she still is quite weak and cannot stand/walk independently.    Provider Appointments/Follow up - upcoming appointments for Susan [name] are as follows:   Primary Care - Dr. Vic Blackbird ([facility]) - patient cancelled because of conflicting appointment; she is to return a call to me when she discusses with her husband which days will work for him to take off (transportation)  Ophthalmology/Retinal Specialist - 09/01/17   Durable Medical Equipment - Susan [name] confirms that she has in addition to the gait belt, two walkers: a) standard rolling walker and b) rollator walker   Care Coordination/Health Education regarding Chronic Health Conditions:   CHF - Susan [name] and I discussed her CHF only briefly today as she was very interested in discussing management of her DM (see below). I will follow up with her next week to discuss more about the long term management of her CHF.  DM - Susan [name] is very aware that her diabetes is not well managed. Her HgA1C = 11.4 during her recent hospitalation (07/16/17). Today, we discussed her goal of 7, her prescribed medication/insulin regimen, and her prescribed carb modified diet. Susan [name] admits to having a poor working knowledge of her prescribed diet. I am sending print materials to her and we spent a great deal of time on  the phone discussing options and meal planning. Susan [name] is checking her cbg's three times daily and recording and she is knowledgeable about signs and symptoms of hypoglycemia/hyperglycemia.    [name] CM Care Plan Problem One     Most Recent Value  Care  Plan Problem One  Knowledge Deficits related to long term management of DMII  Role Documenting the Problem One  Care Management Coordinator  Care Plan for Problem One  Active  [name] Long Term Goal   Over the next 31 days, patient will verbalize understanding of basic plan of care for management of DM  [name] Long Term Goal Start Date  08/25/17  Interventions for Problem One Long Term Goal  reviewed plan of care as established by providers,  discussed interventions including medicatoin management/adherence, and dietary recommendations  [name] CM Short Term Goal #1   Over the next 30 days, patient will omit nightly sherbert and subsittute snack from approved carb modified dietary list as evdienced by patient report of same  [name] CM Short Term Goal #1 Start Date  08/25/17  Interventions for Short Term Goal #1  reviewed approved modified carb snack options  [name] CM Short Term Goal #2   Over the next 30 days, patient will incorporate protein with each meal as evidenced by patient report  [name] CM Short Term Goal #2 Start Date  08/25/17  Interventions for Short Term Goal #2  Emmi educational materials prescribed,  reviewed options for proteins from approved carb modified diet list  [name] CM Short Term Goal #3  Over the next 30 days, patient will contact endocrinology office to schedule appointment  [name] CM Short Term Goal #3 Start Date  08/25/17  Interventions for Short Tern Goal #3  discussed with patient need to contact endocrinology office to schedule appointment     Janalyn Shy [facility] Care Management  [phone]

## 2017-08-25 NOTE — Telephone Encounter (Signed)
Received call from Amy, [facility] PT with [facility].   Requested VO for HH PT 3x weekly x2 weeks, then 2x weekly x1 week for BLE strengthening and ambulation.   VO given.

## 2017-08-25 NOTE — Patient Outreach (Signed)
Assessment:  CSW spoke via phone with client . CSW verified client identity. CSW received verbal permission from client on 08/25/17 for CSW to speak with client about client needs. Client had been receiving care at [facility] facility in [place], [place].  She recently discharged from that nursing care facility and returned to her home with needed supports in place.  She has some support from her daughter, from her son,  and from her spouse. Client said that her son is staying with her at present to help out.  Her spouse returns home from work about 5:30 PM daily and helps with client needs after he returns from work. Client said Welby was working with client in the home. Client is scheduled to receive home health nursing, home health physical therapy and home health occupational therapy from [facility] as scheduled. Client said she had her prescribed medications.  She said she has appointment with eye specialist in one week.  Dr. [name] is primary care doctor for client. CSW spoke with client about client care plan CSW encouraged client to participate in scheduled client physical therapy sessions for client in home of client in next 30 days. Client said she is eating adequately and sleeping adequately. She said she is receiving strong support at present in the home and she did not think she could make it at home without such strong support. She has two walkers, a standard walker and a rollator walker.  CSW thanked Melek for phone call with CSW . CSW encouraged client to call CSW as needed to discuss social work needs of client.    Plan:  Client to participate in scheduled client physical therapy sessions for client in the home of client  in next 30 days.  CSW to call client in 3 weeks to assess client needs.  [name].Mysty Kielty MSW, LCSW Licensed Clinical Social Worker [facility] Care Management [phone]

## 2017-08-25 NOTE — Telephone Encounter (Signed)
noted 

## 2017-08-27 ENCOUNTER — Other Ambulatory Visit: Payer: PPO

## 2017-08-31 ENCOUNTER — Inpatient Hospital Stay: Payer: PPO | Admitting: Family Medicine

## 2017-09-01 ENCOUNTER — Ambulatory Visit: Payer: Self-pay | Admitting: *Deleted

## 2017-09-02 ENCOUNTER — Other Ambulatory Visit: Payer: Self-pay | Admitting: *Deleted

## 2017-09-02 NOTE — Patient Outreach (Signed)
Transition of care call. Susan [name] is getting home health PT and is making good progress. She is not using a wheelchair at all in her home. She is using her 2 wheeled walker but her therapist is recommending a walker with a seat so that she can rest when she gets tired. She will put in an order for this.  Susan [name] says that her husband has not been able to do the home remodeling tasks we had previously recommended. He has returned to work and has not had the time or the means to do this alterations.  I have asked Susan [name] to call me if she has any issues and I will call her again in one week.  Susan [name]. Susan Neither, MSN, [facility] Gerontological Nurse Practitioner [facility] Care Management [phone]

## 2017-09-09 ENCOUNTER — Ambulatory Visit: Payer: Self-pay | Admitting: *Deleted

## 2017-09-10 ENCOUNTER — Other Ambulatory Visit: Payer: Self-pay | Admitting: *Deleted

## 2017-09-10 NOTE — Patient Outreach (Signed)
[facility]) Care Management  09/10/2017  Susan [name] September 11, 1950 [id number]  Coverage for Susan [name] for transition of care, week 3, HTA high risk patient  Verified patient's identity and verbal consent provided  Susan [name] reports her Home health staff states she is still making progress but she feels she has not made progress "I'm inpatient. I want to be able to work by myself" She reports the home health staff is to call Susan [name] or Susan [name] to request get extra therapy sessions She states she is walking now only about 50 feet per HHPT, Amy and the HHPT d/Susan date is 09/11/17. "I am only walking down my hallway." Wt is 281 lbs "I am scare of falling if I let go of the walker." CM encouraged her to walk with head up and eyes on destination. Susan [name] reports she was informed by HHPT that she is not ready to use her bariatric rollator at this time related to it having 4 wheels and would go to fast for her. They have recommended a 2 wheel walker but she does not have one and states it would have to be ordered. Cm recommended having her call or her husband or son check a local [employer] or [employer] for a possible discounted available 2 wheel bariatric walker   She reports her son does not drive but her husband is available to take her to MD appointments. She does not have bariatric w/Susan and feels that if she had one it may hinder her progress.  Cm discussed having her consult her HHPT staff for bariatric w/Susan and use of the bariatric w/Susan only for safety measures. CM discussed face to face evaluations per MDs for some further orders/DME. She reports she has had to cancelled MD appointments because she did not feel like going.   Her husband still has not been able to do the home remodeling tasks we had previously recommended "maybe he will have time to get to them this weekend" Still has not been in her shower, take sink baths "I'm definitely scared to do that right now"   Encouragement provided   Doing okay with her medications  Diabetes Reports issues with cbgs in 300-400s Still states she is not following her diet well  Hypoglycemia episode cbg 54 "last evening. I was sweating and shaking . I ate and ate and then my husband came in and reminded me to take orange juice and it came back up" CM discussed protein foods meats, sugar free peanut butter,etc  CM inquired about nutritionist consult. She reports she has seen a nutritionist x 1 and states she feels she "can do it myself" CM encouraged further sessions with a nutritionist After Susan [name] stated she did not like taking in water (except ice cold water), CM discussed powerade zero, propels and fruit02 as possible options   Plans CM will updated Susan [name] on Susan [name]'s progress  CM provided thi CM contact number in case of questions or concerns Pt to be contacted further by Susan [name]  Routed note to Epic MDs for collaboration Sent in basket note to Susan [name] related to request of more therapy sessions [facility] CM Care Plan Problem One     Most Recent Value  Care Plan Problem One  Knowledge Deficits related to long term management of DMII  Role Documenting the Problem One  Care Management Coordinator  Care Plan for Problem One  Active  [name] Long Term Goal   Over the next  31 days, patient will verbalize understanding of basic plan of care for management of DM  [name] Long Term Goal Start Date  08/25/17  Interventions for Problem One Long Term Goal  discussed protein foods meats, sugar free peanut butter,etc discussed powerade zero, propels and fruit02 as possible options   [name] CM Short Term Goal #1   Over the next 30 days, patient will omit nightly sherbert and subsittute snack from approved carb modified dietary list as evdienced by patient report of same  [name] CM Short Term Goal #1 Start Date  08/25/17  Interventions for Short Term Goal #1  encouraged further sessions with a nutritionist  [name] CM Short Term Goal #2    Over the next 30 days, patient will incorporate protein with each meal as evidenced by patient report  [name] CM Short Term Goal #2 Start Date  08/25/17  Interventions for Short Term Goal #2  discussed protein foods meats, sugar free peanut butter  [name] CM Short Term Goal #3  Over the next 30 days, patient will contact endocrinology office to schedule appointment  [facility] CM Short Term Goal #3 Start Date  08/25/17  Interventions for Short Tern Goal #3  did not discuss Unable to see an appointment in [name] for an endocrinologist listed       Davontae Prusinski L. Noelle Penner, RN, BSN, CCM [facility] Care Management Care Coordinator [phone] week day mobile

## 2017-09-14 ENCOUNTER — Telehealth: Payer: Self-pay | Admitting: *Deleted

## 2017-09-14 NOTE — Telephone Encounter (Signed)
Received call from Amy, [facility] PT with [facility].   Requested to extend [facility] PT services 2x weekly x3 weeks for strengthening and home exercise program.   VO given.

## 2017-09-15 ENCOUNTER — Other Ambulatory Visit: Payer: Self-pay | Admitting: Family Medicine

## 2017-09-16 ENCOUNTER — Other Ambulatory Visit: Payer: Self-pay | Admitting: Licensed Clinical Social Worker

## 2017-09-16 ENCOUNTER — Ambulatory Visit: Payer: Self-pay | Admitting: *Deleted

## 2017-09-16 ENCOUNTER — Telehealth: Payer: Self-pay | Admitting: *Deleted

## 2017-09-16 DIAGNOSIS — R609 Edema, unspecified: Secondary | ICD-10-CM

## 2017-09-16 NOTE — Patient Outreach (Signed)
Assessment:  CSW spoke via phone with client. CSW verified client identity. CSW received verbal permission from client for CSW to speak with client about client needs. CSW spoke with client about client care plan.  CSW encouraged client to continue to participate in scheduled client in home physical therapy sessions for client in next 30 days to help with client ability to ambulate in the home. Client is using walker to ambulate.  Client has not had any recent falls. Client said her son is helping her in the home for 4 days weekly. Also, spouse of client  helps client when he is not working. Client said she is eating adequately. She said she has some fluid "weeping" on her right shin. CSW informed client that CSW would inform Susan [name] about client symptom. CSW thanked Susan [name] for phone call with CSW on 09/16/17. CSW encouraged Susan [name] to call CSW at [phone] as needed to discuss social work needs of client.   Plan:  Client to contnue to participate in scheduled client  in home physical therapy sessions for client in the next 30 days to help with client ability to ambulate in the home  CSW to collaborate with Susan [name], Geriatric Nurse Practitioner, in monitoring client needs.  CSW to call client in 4 weeks to assess client needs and status at that time.  [name].Jahseh Lucchese MSW, LCSW Licensed Clinical Social Worker [facility] Care Management [phone]

## 2017-09-16 NOTE — Telephone Encounter (Signed)
Received call from [place], [facility] PT with [facility]. [phone]~ telephone.   Reports that patient is being seen for [facility] PT. Noted that patient has increased edema to BLE. Reports that patient is not wearing compression hose. Also states that patient has 3 open wounds to R lower leg. States that largest wound in 0.5 x0.5 and is draining clear fluid. VO given for [facility] SN for wound care. Advised to clean area with warm soapy water, apply nonstick guaze, and rolled guaze to area QD until [facility] SN can assess. Verbalized understanding.   Received call from Verdis Frederickson [facility] SN with [facility] [phone] telephone.   Reports that she saw patient today for wound care.  States that wounds appear to be venous statis ulcers. Reports that BLE are edematous as well, but she requires ABI studies before UNNA boots can be placed. States that patient is not taking Zaroxolyn and is only taking Torsemide 80mg , though SNF discharge summary reported patient is to take Torsemide 100mg . States that she believes that if patient takes medications as prescribed, fluid would be greatly reduced in BLE. Educated patient on medication dose.   Noted in chart patient had ABI on 06/17/2016. Noted interpretation of results as follows: Notes Recorded by Conrad [place], NP on 06/18/2016 at 9:25 AM EST ABI non compressible vessels due to diabetes. Toe pressures ok. Please call. Nelson for [facility] to compress lower extremity edema.  Verdis Frederickson, [facility] SN made aware of last results. States that she is uncomfortable placing UNNA boots with results stating non-compressible vessels due to DM. Requested to have new ABI done prior to ordering UNNA boots for edema and venous stasis ulcer.   Advised that patient has not been seen in office since discharge from SNF. Appointment scheduled.   Maria requested VO to extend [facility] services until end of re-cert period. VO given. States that she will continue QD dressing changes until appointment and await new orders.

## 2017-09-17 ENCOUNTER — Other Ambulatory Visit: Payer: Self-pay | Admitting: *Deleted

## 2017-09-17 DIAGNOSIS — E039 Hypothyroidism, unspecified: Secondary | ICD-10-CM | POA: Diagnosis not present

## 2017-09-17 DIAGNOSIS — F419 Anxiety disorder, unspecified: Secondary | ICD-10-CM | POA: Diagnosis not present

## 2017-09-17 DIAGNOSIS — I130 Hypertensive heart and chronic kidney disease with heart failure and stage 1 through stage 4 chronic kidney disease, or unspecified chronic kidney disease: Secondary | ICD-10-CM | POA: Diagnosis not present

## 2017-09-17 DIAGNOSIS — E1100 Type 2 diabetes mellitus with hyperosmolarity without nonketotic hyperglycemic-hyperosmolar coma (NKHHC): Secondary | ICD-10-CM | POA: Diagnosis not present

## 2017-09-17 DIAGNOSIS — M1991 Primary osteoarthritis, unspecified site: Secondary | ICD-10-CM | POA: Diagnosis not present

## 2017-09-17 DIAGNOSIS — I2510 Atherosclerotic heart disease of native coronary artery without angina pectoris: Secondary | ICD-10-CM | POA: Diagnosis not present

## 2017-09-17 DIAGNOSIS — R32 Unspecified urinary incontinence: Secondary | ICD-10-CM | POA: Diagnosis not present

## 2017-09-17 NOTE — Patient Outreach (Signed)
Transition of care call #4 attempted, no answer, left message and requested a return phone call.  [name]. [name], MSN, [facility] Gerontological Nurse Practitioner [facility] Care Management [phone]

## 2017-09-18 ENCOUNTER — Other Ambulatory Visit: Payer: Self-pay | Admitting: Family Medicine

## 2017-09-18 ENCOUNTER — Other Ambulatory Visit: Payer: Self-pay | Admitting: *Deleted

## 2017-09-18 NOTE — Telephone Encounter (Signed)
Error her ABI were done 1 year ago, not 3 months ago Okay to order test - Dx- PVD  They can continue to dress wounds and ACE wrap can be applied over dressing instead of full Unna boot

## 2017-09-18 NOTE — Telephone Encounter (Signed)
Call placed to patient and patient made aware.   Referral orders placed.  

## 2017-09-18 NOTE — Telephone Encounter (Signed)
Per vascular notes, she may have Unna Boots placed, had had on and off for past few months Does not need repeat ABI at this time

## 2017-09-18 NOTE — Patient Outreach (Signed)
Transition of care call #4 completed. I spoke with Susan [name] today, she reports she is fair. Still having a lot of difficulty with mobility. Her legs are moderately edematous, not weeping today. She has made an appointment to see Dr. [name] on May 3rd.   I will call her again next week and see her the following. I am ordering her some support stockings that she will be able to put on herself.  [name]. [name], MSN, [facility] Gerontological Nurse Practitioner [facility] Care Management [phone]

## 2017-09-22 DIAGNOSIS — M79671 Pain in right foot: Secondary | ICD-10-CM | POA: Diagnosis not present

## 2017-09-22 DIAGNOSIS — I5033 Acute on chronic diastolic (congestive) heart failure: Secondary | ICD-10-CM | POA: Diagnosis not present

## 2017-09-22 DIAGNOSIS — R0602 Shortness of breath: Secondary | ICD-10-CM | POA: Diagnosis not present

## 2017-09-22 DIAGNOSIS — L84 Corns and callosities: Secondary | ICD-10-CM | POA: Diagnosis not present

## 2017-09-22 DIAGNOSIS — E1051 Type 1 diabetes mellitus with diabetic peripheral angiopathy without gangrene: Secondary | ICD-10-CM | POA: Diagnosis not present

## 2017-09-22 DIAGNOSIS — N183 Chronic kidney disease, stage 3 (moderate): Secondary | ICD-10-CM | POA: Diagnosis not present

## 2017-09-22 DIAGNOSIS — M79672 Pain in left foot: Secondary | ICD-10-CM | POA: Diagnosis not present

## 2017-09-22 DIAGNOSIS — I739 Peripheral vascular disease, unspecified: Secondary | ICD-10-CM | POA: Diagnosis not present

## 2017-09-22 DIAGNOSIS — R269 Unspecified abnormalities of gait and mobility: Secondary | ICD-10-CM | POA: Diagnosis not present

## 2017-09-22 DIAGNOSIS — L603 Nail dystrophy: Secondary | ICD-10-CM | POA: Diagnosis not present

## 2017-09-23 ENCOUNTER — Ambulatory Visit: Payer: Self-pay | Admitting: *Deleted

## 2017-09-23 DIAGNOSIS — H3582 Retinal ischemia: Secondary | ICD-10-CM | POA: Diagnosis not present

## 2017-09-23 DIAGNOSIS — H43813 Vitreous degeneration, bilateral: Secondary | ICD-10-CM | POA: Diagnosis not present

## 2017-09-23 DIAGNOSIS — E113491 Type 2 diabetes mellitus with severe nonproliferative diabetic retinopathy without macular edema, right eye: Secondary | ICD-10-CM | POA: Diagnosis not present

## 2017-09-23 DIAGNOSIS — E113412 Type 2 diabetes mellitus with severe nonproliferative diabetic retinopathy with macular edema, left eye: Secondary | ICD-10-CM | POA: Diagnosis not present

## 2017-09-23 LAB — HM DIABETES EYE EXAM

## 2017-09-25 ENCOUNTER — Other Ambulatory Visit: Payer: Self-pay

## 2017-09-25 ENCOUNTER — Ambulatory Visit (INDEPENDENT_AMBULATORY_CARE_PROVIDER_SITE_OTHER): Payer: PPO | Admitting: Family Medicine

## 2017-09-25 ENCOUNTER — Other Ambulatory Visit: Payer: Self-pay | Admitting: *Deleted

## 2017-09-25 ENCOUNTER — Encounter: Payer: Self-pay | Admitting: Family Medicine

## 2017-09-25 VITALS — BP 132/72 | HR 86 | Temp 98.0°F | Resp 14 | Ht 65.0 in | Wt 274.0 lb

## 2017-09-25 DIAGNOSIS — E1165 Type 2 diabetes mellitus with hyperglycemia: Secondary | ICD-10-CM

## 2017-09-25 DIAGNOSIS — R609 Edema, unspecified: Secondary | ICD-10-CM

## 2017-09-25 DIAGNOSIS — Z23 Encounter for immunization: Secondary | ICD-10-CM | POA: Diagnosis not present

## 2017-09-25 DIAGNOSIS — I10 Essential (primary) hypertension: Secondary | ICD-10-CM | POA: Diagnosis not present

## 2017-09-25 DIAGNOSIS — N184 Chronic kidney disease, stage 4 (severe): Secondary | ICD-10-CM | POA: Diagnosis not present

## 2017-09-25 DIAGNOSIS — E1142 Type 2 diabetes mellitus with diabetic polyneuropathy: Secondary | ICD-10-CM | POA: Diagnosis not present

## 2017-09-25 DIAGNOSIS — I5032 Chronic diastolic (congestive) heart failure: Secondary | ICD-10-CM

## 2017-09-25 DIAGNOSIS — F331 Major depressive disorder, recurrent, moderate: Secondary | ICD-10-CM | POA: Diagnosis not present

## 2017-09-25 DIAGNOSIS — E1122 Type 2 diabetes mellitus with diabetic chronic kidney disease: Secondary | ICD-10-CM

## 2017-09-25 LAB — COMPREHENSIVE METABOLIC PANEL
AG Ratio: 1.4 (calc) (ref 1.0–2.5)
ALT: 17 U/L (ref 6–29)
AST: 25 U/L (ref 10–35)
Albumin: 3.6 g/dL (ref 3.6–5.1)
Alkaline phosphatase (APISO): 158 U/L — ABNORMAL HIGH (ref 33–130)
BUN/Creatinine Ratio: 34 (calc) — ABNORMAL HIGH (ref 6–22)
BUN: 53 mg/dL — ABNORMAL HIGH (ref 7–25)
CO2: 22 mmol/L (ref 20–32)
Calcium: 8.8 mg/dL (ref 8.6–10.4)
Chloride: 108 mmol/L (ref 98–110)
Creat: 1.57 mg/dL — ABNORMAL HIGH (ref 0.50–0.99)
Globulin: 2.6 g/dL (calc) (ref 1.9–3.7)
Glucose, Bld: 132 mg/dL — ABNORMAL HIGH (ref 65–99)
Potassium: 5.6 mmol/L — ABNORMAL HIGH (ref 3.5–5.3)
Sodium: 137 mmol/L (ref 135–146)
Total Bilirubin: 0.4 mg/dL (ref 0.2–1.2)
Total Protein: 6.2 g/dL (ref 6.1–8.1)

## 2017-09-25 MED ORDER — TORSEMIDE 20 MG PO TABS: 80.0000 mg | ORAL_TABLET | Freq: Two times a day (BID) | ORAL | 0 refills | Status: DC

## 2017-09-25 MED ORDER — METOLAZONE 5 MG PO TABS: 5.0000 mg | ORAL_TABLET | ORAL | 0 refills | Status: DC

## 2017-09-25 MED ORDER — ESCITALOPRAM OXALATE 10 MG PO TABS: 10.0000 mg | ORAL_TABLET | Freq: Every day | ORAL | 2 refills | Status: DC

## 2017-09-25 NOTE — Progress Notes (Signed)
Subjective:    Patient ID: Susan [name], female    DOB: 08/27/1950, 67 y.o.   MRN: [id number]  Patient presents for Hospital F/u  Pt here for hospital f/u  She was sent to ER after my last visit in Feb , found to have hyperosmoloar non ketotic state, Klebsiella UTI in setting of poorly controlled diabetes.  She refused SNF and then returned to the hospital with recurrent falls and weakness.  Her second admission in the setting of a multiple fall she had acute kidney injury as well as acute metabolic encephalopathy and had elevated troponins.  Cardiology was consulted she had right heart cath on March 1 which showed right heart failure due to obstructive sleep apnea and OHS.  She was resumed on her diuretics and she did agree to skilled nursing facility admission at that time. [facility] - released March 21st   Stage IV CKD-creatinine at discharge was 8.67  Diastolic heart failure right heart failure ejection fraction 55% she is still on furosemide 60 mg twice a day per the [facility] but she states she is taking     - Taking demadex 80mg  twice a day   - and metlazone 5mg  once a week   Diabetes mellitus uncontrolled diabetes mellitus her last A1c 11.4%  states her Sugar has been "perfect" this AM 99 and lunch 127  Novolin N 35  And 6 units at nights , SSI with regular  Coronary artery disease she is continued on her aspirin Plavix statin drug long-acting nitrate  Anemia of chronic disease-hemoglobin last 8.7 due for repeat typically between 9 and 10 she is also continued on iron supplement  Major depression she was switched to Lexapro while she was in a nursing facility  Peripheral vascular disease she also continues to have significant leg swelling and weeping per notation from home health nurse.  She i is being scheduled to have vascular studies.   Seen by Eye doctor on [place] Hemorrhage in left eye - Dr. Sherlynn Stalls    states she has pill caddy for the week ,states she  is taking her insulin  2 weeks ago- had wounds on legs, HH coming once a week, using topical and then kerlix wraps  PT coming twice a week, using walker   Seen at [facility] - had nails clipped and heels scraped, her diabetic shoes hurt her feet so she is not wearing them        Review Of Systems:  GEN- denies fatigue, fever, weight loss,weakness, recent illness HEENT- denies eye drainage, change in vision, nasal discharge, CVS- denies chest pain, palpitations RESP- denies SOB, cough, wheeze ABD- denies N/V, change in stools, abd pain GU- denies dysuria, hematuria, dribbling, incontinence MSK- + joint pain, muscle aches, injury Neuro- denies headache, dizziness, syncope, seizure activity       Objective:    BP 132/72   Pulse 86   Temp 98 F (36.7 C) (Oral)   Resp 14   Ht 5\' 5"  (1.651 m)   Wt 274 lb (124.3 kg)   SpO2 98%   BMI 45.60 kg/m  GEN- NAD, alert and oriented x3,obese sitting in chair, has walker HEENT- PERRL, EOMI, non injected sclera, pink conjunctiva, MMM, oropharynx clear Neck- Supple, no thyromegaly CVS- RRR, no murmur RESP-CTAB ABD-NABS,soft,NT,ND EXT- chronic venous stasis changes, no weeping but 1+ edema, 1 small sore on righ tleg without scab, no odor, no pus draining  Pulses- Radial 2+, DP-diminished  Assessment & Plan:      Problem List Items Addressed This Visit      Unprioritized   Diabetic neuropathy ([place])   Essential hypertension   Relevant Medications   metolazone (ZAROXOLYN) 5 MG tablet   torsemide (DEMADEX) 20 MG tablet   Other Relevant Orders   CBC with Differential/Platelet (Completed)   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 mellitus ([place])   Relevant Orders   CBC with Differential/Platelet (Completed)   Comprehensive metabolic panel (Completed)   Morbid obesity (HCC) (Chronic)   Peripheral edema - Primary    Obtain vascular studies,  Bandage over superficial wound Given script for compression hose        Relevant Orders   VAS [name] ABI WITH/WO TBI   MDD (major depressive disorder)    Continue lexapro       Relevant Medications   escitalopram (LEXAPRO) 10 MG tablet   Diabetes mellitus type II, uncontrolled ([place])    Tried to contact endocrine they will reopen on Monday Make sure she has had f/u appt Recheck A1C today States she is taking her insulin regulary A1C was much improved at 9.3% No change to dosing      Relevant Orders   Hemoglobin A1c (Completed)   Chronic diastolic CHF (congestive heart failure) (HCC) (Chronic)    Overall compensated, appt made with cardiology while at visit today  She is on 2 diuretics, renal function has improved Defer to cardiology      Relevant Medications   metolazone (ZAROXOLYN) 5 MG tablet   torsemide (DEMADEX) 20 MG tablet    Other Visit Diagnoses    Need for shingles vaccine       Relevant Orders   Varicella-zoster vaccine IM (Shingrix) (Completed)      Note: This dictation was prepared with Dragon dictation along with smaller phrase technology. Any transcriptional errors that result from this process are unintentional.

## 2017-09-25 NOTE — Patient Instructions (Addendum)
F/U 1 month  Continue the diuretics Continue your diabetes medication, do not miss any injections We will call with lab results on Monday Cardiology visit - May 16th at 2:30pm Get the compression hose  Shingrix given Release of records- [place] discharge summary

## 2017-09-26 LAB — HEMOGLOBIN A1C
Hgb A1c MFr Bld: 9.3 % of total Hgb — ABNORMAL HIGH (ref <5.7)
Mean Plasma Glucose: 220 (calc)
eAG (mmol/L): 12.2 (calc)

## 2017-09-26 LAB — CBC WITH DIFFERENTIAL/PLATELET
Basophils Absolute: 33 cells/uL (ref 0–200)
Basophils Relative: 0.5 %
Eosinophils Absolute: 191 cells/uL (ref 15–500)
Eosinophils Relative: 2.9 %
HCT: 33.1 % — ABNORMAL LOW (ref 35.0–45.0)
Hemoglobin: 10.4 g/dL — ABNORMAL LOW (ref 11.7–15.5)
Lymphs Abs: 752 cells/uL — ABNORMAL LOW (ref 850–3900)
MCH: 24.5 pg — ABNORMAL LOW (ref 27.0–33.0)
MCHC: 31.4 g/dL — ABNORMAL LOW (ref 32.0–36.0)
MCV: 78.1 fL — ABNORMAL LOW (ref 80.0–100.0)
MPV: 11.7 fL (ref 7.5–12.5)
Monocytes Relative: 6.4 %
Neutro Abs: 5201 cells/uL (ref 1500–7800)
Neutrophils Relative %: 78.8 %
Platelets: 233 10*3/uL (ref 140–400)
RBC: 4.24 10*6/uL (ref 3.80–5.10)
RDW: 15.5 % — ABNORMAL HIGH (ref 11.0–15.0)
Total Lymphocyte: 11.4 %
WBC mixed population: 422 cells/uL (ref 200–950)
WBC: 6.6 10*3/uL (ref 3.8–10.8)

## 2017-09-27 ENCOUNTER — Encounter: Payer: Self-pay | Admitting: Family Medicine

## 2017-09-27 NOTE — Assessment & Plan Note (Signed)
Overall compensated, appt made with cardiology while at visit today  She is on 2 diuretics, renal function has improved Defer to cardiology

## 2017-09-27 NOTE — Assessment & Plan Note (Signed)
Obtain vascular studies,  Bandage over superficial wound Given script for compression hose

## 2017-09-27 NOTE — Assessment & Plan Note (Signed)
Continue lexapro  ?

## 2017-09-27 NOTE — Assessment & Plan Note (Signed)
>>  ASSESSMENT AND PLAN FOR CHRONIC DIASTOLIC CHF (CONGESTIVE HEART FAILURE) (HCC) WRITTEN ON 09/27/2017  7:52 PM BY [name], KAWANTA F  Overall compensated, appt made with cardiology while at visit today  She is on 2 diuretics, renal function has improved Defer to cardiology

## 2017-09-27 NOTE — Assessment & Plan Note (Signed)
Tried to contact endocrine they will reopen on Monday Make sure she has had f/u appt Recheck A1C today States she is taking her insulin regulary A1C was much improved at 9.3% No change to dosing

## 2017-09-28 ENCOUNTER — Telehealth: Payer: Self-pay | Admitting: *Deleted

## 2017-09-28 NOTE — Telephone Encounter (Signed)
[facility] Dr. Missy Sabins 10/08/2017 @ 2:30pm  [facility]~ Endocrinology Dr. [name] 09/29/2017- 11:30am

## 2017-09-29 ENCOUNTER — Other Ambulatory Visit: Payer: PPO

## 2017-09-29 DIAGNOSIS — E875 Hyperkalemia: Secondary | ICD-10-CM | POA: Diagnosis not present

## 2017-09-29 NOTE — Telephone Encounter (Signed)
noted 

## 2017-09-30 ENCOUNTER — Ambulatory Visit: Payer: Self-pay | Admitting: *Deleted

## 2017-09-30 ENCOUNTER — Other Ambulatory Visit: Payer: Self-pay | Admitting: Physician Assistant

## 2017-09-30 LAB — BASIC METABOLIC PANEL
BUN/Creatinine Ratio: 27 (calc) — ABNORMAL HIGH (ref 6–22)
BUN: 47 mg/dL — ABNORMAL HIGH (ref 7–25)
CO2: 23 mmol/L (ref 20–32)
Calcium: 9 mg/dL (ref 8.6–10.4)
Chloride: 108 mmol/L (ref 98–110)
Creat: 1.72 mg/dL — ABNORMAL HIGH (ref 0.50–0.99)
Glucose, Bld: 198 mg/dL — ABNORMAL HIGH (ref 65–99)
Potassium: 5.7 mmol/L — ABNORMAL HIGH (ref 3.5–5.3)
Sodium: 138 mmol/L (ref 135–146)

## 2017-10-01 ENCOUNTER — Other Ambulatory Visit: Payer: Self-pay | Admitting: *Deleted

## 2017-10-01 NOTE — Patient Outreach (Signed)
Home visit. Pt reports she is getting along fairly well. She has seen her primary care MD, and opthalmologist for a diabetic eye exam. She is weighing daily and not adding salt to her food. She is checking her glucose daily and recording this. Her fasting levels are usually <150, today however it was 176. The highest NFBS she has had was 251.   She reports her last Hgb A1C came down 2 grams from 11.0+ to 9.0+. She was in the hospital and SNF for most of the time prior to this reading. She does say she has cut out sweets and is trying to reduce carbs and portions. She requests more information to help her with this.  Her husband has not been able to make the home modifications that were requested before she went home from the SNF. We discussed alternatives that may be easier to incorporate.  BP 120/64 (BP Location: Left Arm, Patient Position: Sitting, Cuff Size: Normal)   Pulse 72   Resp 18   Wt 276 lb (125.2 kg)   SpO2 94%   BMI 45.93 kg/m   RRR Lungs clear Peripheral edema 2+, lower extremities are cyanotic, pulses are 2+, no foot lesions. Pt wearing a band aid on L upper calf where she has had fluid seepage.  A: No falls since she has been home     HF - stable     Diabetes management improving  P:  Request Dr. [name] to order a transfer bench.      Provided 2 pairs of light support knee highs 15-20 mm/Hg and assisted pt to apply. Instructed son to assist pt do this daily. May use stocking assist device to help. Wear daily. Wear nonskid footies over them.       Discussed at length safety and accident prevention, cautioning not to do anything that feels risky.       Discussed carbs, what foods are carbs, how much is appropriate per meal, portion sizes. Will send educational materials, "All About Carbs"       Discussed HF action plan emphasizing early action for any problems. Pt is to ask Dr. Missy Sabins what her fluid intake should be. She admits to drinking 5 large glasses of water daily  approximately 3 L a day.  I will call pt next week to follow up!  [name]. [name], MSN, [facility] Gerontological Nurse Practitioner [facility] Care Management [phone]

## 2017-10-06 ENCOUNTER — Other Ambulatory Visit (HOSPITAL_COMMUNITY): Payer: Self-pay | Admitting: Internal Medicine

## 2017-10-07 ENCOUNTER — Other Ambulatory Visit: Payer: Self-pay | Admitting: *Deleted

## 2017-10-08 ENCOUNTER — Ambulatory Visit (HOSPITAL_COMMUNITY)
Admission: RE | Admit: 2017-10-08 | Discharge: 2017-10-08 | Disposition: A | Discharge status: Home / Self Care | Payer: PPO | Source: Ambulatory Visit | Attending: Internal Medicine | Admitting: Internal Medicine

## 2017-10-08 ENCOUNTER — Encounter (HOSPITAL_COMMUNITY): Payer: Self-pay

## 2017-10-08 VITALS — BP 112/66 | HR 87 | Wt 269.0 lb

## 2017-10-08 DIAGNOSIS — I10 Essential (primary) hypertension: Secondary | ICD-10-CM

## 2017-10-08 DIAGNOSIS — X58XXXA Exposure to other specified factors, initial encounter: Secondary | ICD-10-CM | POA: Diagnosis not present

## 2017-10-08 DIAGNOSIS — E1165 Type 2 diabetes mellitus with hyperglycemia: Secondary | ICD-10-CM | POA: Diagnosis not present

## 2017-10-08 DIAGNOSIS — Z8673 Personal history of transient ischemic attack (TIA), and cerebral infarction without residual deficits: Secondary | ICD-10-CM | POA: Insufficient documentation

## 2017-10-08 DIAGNOSIS — F419 Anxiety disorder, unspecified: Secondary | ICD-10-CM | POA: Diagnosis not present

## 2017-10-08 DIAGNOSIS — E669 Obesity, unspecified: Secondary | ICD-10-CM | POA: Diagnosis not present

## 2017-10-08 DIAGNOSIS — Z794 Long term (current) use of insulin: Secondary | ICD-10-CM | POA: Diagnosis not present

## 2017-10-08 DIAGNOSIS — E039 Hypothyroidism, unspecified: Secondary | ICD-10-CM | POA: Diagnosis not present

## 2017-10-08 DIAGNOSIS — R5383 Other fatigue: Secondary | ICD-10-CM | POA: Insufficient documentation

## 2017-10-08 DIAGNOSIS — I252 Old myocardial infarction: Secondary | ICD-10-CM | POA: Insufficient documentation

## 2017-10-08 DIAGNOSIS — E785 Hyperlipidemia, unspecified: Secondary | ICD-10-CM | POA: Insufficient documentation

## 2017-10-08 DIAGNOSIS — E1122 Type 2 diabetes mellitus with diabetic chronic kidney disease: Secondary | ICD-10-CM | POA: Diagnosis not present

## 2017-10-08 DIAGNOSIS — S81801A Unspecified open wound, right lower leg, initial encounter: Secondary | ICD-10-CM | POA: Insufficient documentation

## 2017-10-08 DIAGNOSIS — I130 Hypertensive heart and chronic kidney disease with heart failure and stage 1 through stage 4 chronic kidney disease, or unspecified chronic kidney disease: Secondary | ICD-10-CM | POA: Diagnosis not present

## 2017-10-08 DIAGNOSIS — N183 Chronic kidney disease, stage 3 (moderate): Secondary | ICD-10-CM | POA: Insufficient documentation

## 2017-10-08 DIAGNOSIS — L539 Erythematous condition, unspecified: Secondary | ICD-10-CM | POA: Diagnosis not present

## 2017-10-08 DIAGNOSIS — Z6841 Body Mass Index (BMI) 40.0 and over, adult: Secondary | ICD-10-CM | POA: Diagnosis not present

## 2017-10-08 DIAGNOSIS — M1990 Unspecified osteoarthritis, unspecified site: Secondary | ICD-10-CM | POA: Diagnosis not present

## 2017-10-08 DIAGNOSIS — E1142 Type 2 diabetes mellitus with diabetic polyneuropathy: Secondary | ICD-10-CM | POA: Insufficient documentation

## 2017-10-08 DIAGNOSIS — Z7902 Long term (current) use of antithrombotics/antiplatelets: Secondary | ICD-10-CM | POA: Insufficient documentation

## 2017-10-08 DIAGNOSIS — Z79899 Other long term (current) drug therapy: Secondary | ICD-10-CM | POA: Insufficient documentation

## 2017-10-08 DIAGNOSIS — I25119 Atherosclerotic heart disease of native coronary artery with unspecified angina pectoris: Secondary | ICD-10-CM | POA: Insufficient documentation

## 2017-10-08 DIAGNOSIS — I4510 Unspecified right bundle-branch block: Secondary | ICD-10-CM | POA: Insufficient documentation

## 2017-10-08 DIAGNOSIS — F329 Major depressive disorder, single episode, unspecified: Secondary | ICD-10-CM | POA: Insufficient documentation

## 2017-10-08 DIAGNOSIS — I2510 Atherosclerotic heart disease of native coronary artery without angina pectoris: Secondary | ICD-10-CM | POA: Diagnosis not present

## 2017-10-08 DIAGNOSIS — I5032 Chronic diastolic (congestive) heart failure: Secondary | ICD-10-CM | POA: Diagnosis not present

## 2017-10-08 DIAGNOSIS — Z87891 Personal history of nicotine dependence: Secondary | ICD-10-CM | POA: Insufficient documentation

## 2017-10-08 DIAGNOSIS — N184 Chronic kidney disease, stage 4 (severe): Secondary | ICD-10-CM

## 2017-10-08 DIAGNOSIS — Z79890 Hormone replacement therapy (postmenopausal): Secondary | ICD-10-CM | POA: Diagnosis not present

## 2017-10-08 DIAGNOSIS — Z9111 Patient's noncompliance with dietary regimen: Secondary | ICD-10-CM | POA: Diagnosis not present

## 2017-10-08 DIAGNOSIS — S81802A Unspecified open wound, left lower leg, initial encounter: Secondary | ICD-10-CM | POA: Insufficient documentation

## 2017-10-08 DIAGNOSIS — Z7982 Long term (current) use of aspirin: Secondary | ICD-10-CM | POA: Insufficient documentation

## 2017-10-08 LAB — BASIC METABOLIC PANEL
Anion gap: 8 (ref 5–15)
BUN: 66 mg/dL — ABNORMAL HIGH (ref 6–20)
CO2: 20 mmol/L — ABNORMAL LOW (ref 22–32)
Calcium: 8.9 mg/dL (ref 8.9–10.3)
Chloride: 105 mmol/L (ref 101–111)
Creatinine, Ser: 2.34 mg/dL — ABNORMAL HIGH (ref 0.44–1.00)
GFR calc Af Amer: 23 mL/min — ABNORMAL LOW (ref >60)
GFR calc non Af Amer: 20 mL/min — ABNORMAL LOW (ref >60)
Glucose, Bld: 391 mg/dL — ABNORMAL HIGH (ref 65–99)
Potassium: 4.9 mmol/L (ref 3.5–5.1)
Sodium: 133 mmol/L — ABNORMAL LOW (ref 135–145)

## 2017-10-08 LAB — CBC
HCT: 32 % — ABNORMAL LOW (ref 36.0–46.0)
Hemoglobin: 10 g/dL — ABNORMAL LOW (ref 12.0–15.0)
MCH: 24.4 pg — ABNORMAL LOW (ref 26.0–34.0)
MCHC: 31.3 g/dL (ref 30.0–36.0)
MCV: 78.2 fL (ref 78.0–100.0)
Platelets: 224 10*3/uL (ref 150–400)
RBC: 4.09 MIL/uL (ref 3.87–5.11)
RDW: 15.9 % — ABNORMAL HIGH (ref 11.5–15.5)
WBC: 6.6 10*3/uL (ref 4.0–10.5)

## 2017-10-08 NOTE — Patient Outreach (Signed)
Transition of care call attempted, no one was home. I left a message and requested a return call.  [name]. [name], MSN, [facility] Gerontological Nurse Practitioner [facility] Care Management [phone]

## 2017-10-08 NOTE — Progress Notes (Signed)
fPatient ID: Susan [name], female   DOB: Mar 07, 1951, 67 y.o.   MRN: [id number]   PCP: Dr Scot Dock  Cardiologist: Dr Ron Parker  Endocrinologist: Dr Bubba Camp  Primary HF Cardiologist: Dr. Haroldine Laws  Nephrology: Dr Hollie Salk  [facility] Patient  HPI: Susan [name] is a 67 year old with a history of RBBB, DM, Hypothyroid, diastolic heart failure, CAD MI 2000/2007 BMS 2007, TIA, obesity.    Admitted June 1st through June 3rd, 2017 with altered mental status, hypothermia, and weakness. Diuretics initially held and later restarted.   Admitted 2/27-07/28/17 for AKI. Torsemide was held and restarted at 60 mg BID. She had RHC 07/24/17 that showed mild PAH with evidence of RV strain likely due to OHS/OSA. She was discharged to SNF.  Today she returns for post hospital follow up. She was discharged from SNF in March. She had HH PT through [facility], but has used all her visits. Overall, she is doing okay. She gets SOB after a few steps, but can typically complete ADL's with no SOB. She is using a walker at home and feels weak and unbalanced. She sleeps on 2 pillows at baseline and has BLE edema. She denies dizziness. She had two episodes of right and left sided CP in the last 2 months. They both occurred at rest and were relieved with SL nitro. She says the CP is unchanged in frequency and severity. She is drinking 4 L/day. She is working on her diet, but had [employer] today. Taking all medications. Weights 276-278 lbs at home.   ECHO 07/11/2015- 50-55%.  Grade I DD Echo 07/23/17: EF 55-60%, grade 1 DD  RHC 07/24/17: RA = 17 RV = 48/15 PA = 49/11 (30) PCW = 18 Fick cardiac output/index = 7.0/3.0 PVR = 0.5 WU Ao sat = 97% PA sat = 62%, 64% 1. Normal left-sided pressures 2. Mild PAH with evidence of RV strain likely due to OHS/OSA 3. Normal cardiac output  RHC 09/22/13: RA = 4  RV = 30/5/7  PA = 25/10 (16)  PCW = 7  Fick cardiac output/index = 6.3/2.7  PVR = 1.5 WU   FA sat = 93%  PA sat = 61%, 66%   SH: Former smoker  quit 15 year ago. Does drink alcohol. She is disabled. Lives at home with her husband and disabled son - Husband is truck Geophysicist/field seismologist. Son has a brain injury after he attempted suicide  A few years ago. .    FH: Mom MI  Review of systems complete and found to be negative unless listed in HPI.   Past Medical History:  Diagnosis Date  . Acute MI ([place]) 1999; 2007  . Anemia    hx  . Anginal pain ([place])   . Anxiety   . ARF (acute renal failure) ([place]) 06/2017  . Arthritis    "generalized" (03/15/2014)  . CAD (coronary artery disease)    MI in 2000 - MI  2007 - treated bare metal stent (no nuclear since then as 9/11)  . Carotid artery disease ([place])   . CHF (congestive heart failure) ([place])   . Chronic diastolic heart failure (HCC)    a) ECHO (08/2013) EF 55-60% and RV function nl b) RHC (08/2013) RA 4, RV 30/5/7, PA 25/10 (16), PCWP 7, Fick CO/CI 6.3/2.7, PVR 1.5 WU, PA 61 and 66%  . Daily headache    "~ every other day; since I fell in June" (03/15/2014)  . Depression   . Dyslipidemia   . Exertional shortness of breath   .  HTN (hypertension)   . Hypothyroidism   . Obesity   . Osteoarthritis   . Peripheral neuropathy   . PONV (postoperative nausea and vomiting)   . RBBB (right bundle branch block)    Old  . Renal insufficiency    DENIES  . Stroke [facility])    mini strokes  . Syncope    likely due to low blood sugar  . Tachycardia    Sinus tachycardia  . Type II diabetes mellitus ([place])   . Urinary incontinence   . Venous insufficiency     Current Outpatient Medications  Medication Sig Dispense Refill  . aspirin EC 81 MG tablet Take 81 mg by mouth daily.    . carvedilol (COREG) 12.5 MG tablet TAKE 1 TABLET BY MOUTH TWICE A DAY WITH A MEAL 90 tablet 1  . clopidogrel (PLAVIX) 75 MG tablet TAKE 1 TABLET (75 MG TOTAL) BY MOUTH DAILY WITH BREAKFAST. 90 tablet 2  . isosorbide dinitrate (ISORDIL) 10 MG tablet TAKE 1 TABLET BY MOUTH TWICE A DAY 60 tablet 6  . losartan (COZAAR) 50 MG tablet  TAKE 1 TABLET BY MOUTH EVERY DAY 90 tablet 1  . rosuvastatin (CRESTOR) 20 MG tablet Take 1 tablet (20 mg total) by mouth daily. 90 tablet 3  . torsemide (DEMADEX) 20 MG tablet Take 60 mg by mouth 2 (two) times daily.    Marland Kitchen albuterol (PROVENTIL HFA;VENTOLIN HFA) 108 (90 BASE) MCG/ACT inhaler Inhale 2 puffs into the lungs every 6 (six) hours as needed for wheezing or shortness of breath. Reported on 10/30/2015    . cholecalciferol (VITAMIN D) 1000 units tablet Take 1,000 Units by mouth daily.    Marland Kitchen escitalopram (LEXAPRO) 10 MG tablet Take 1 tablet (10 mg total) by mouth daily. 90 tablet 2  . ferrous sulfate 325 (65 FE) MG tablet Take 325 mg by mouth daily with breakfast. Reported on 08/21/2015    . insulin NPH Human (HUMULIN N,NOVOLIN N) 100 UNIT/ML injection Inject 90 units in the morning and 6 units at bedtime 10 mL 0  . KLOR-CON M10 10 MEQ tablet TAKE 1 TABLET BY MOUTH EVERY MORNING WITH WATER PILL 30 tablet 6  . levothyroxine (SYNTHROID, LEVOTHROID) 50 MCG tablet Take 1 tablet (50 mcg total) by mouth daily before breakfast. 90 tablet 1  . Magnesium Oxide 400 (240 Mg) MG TABS TAKE 1 TABLET BY MOUTH EVERY DAY (Patient taking differently: TAKE 240 mg TABLET BY MOUTH EVERY DAY) 90 tablet 3  . metolazone (ZAROXOLYN) 2.5 MG tablet TAKE 1 TABLET BY MOUTH ONCE A WEEK. 10 tablet 1  . metolazone (ZAROXOLYN) 5 MG tablet Take 1 tablet (5 mg total) by mouth once a week. 12 tablet 0  . nitroGLYCERIN (NITROSTAT) 0.4 MG SL tablet PLACE 1 TABLET (0.4 MG TOTAL) UNDER THE TONGUE EVERY 5 (FIVE) MINUTES AS NEEDED FOR CHEST PAIN. (Patient not taking: Reported on 10/08/2017) 25 tablet 1  . pregabalin (LYRICA) 150 MG capsule Take 1 capsule (150 mg total) by mouth 2 (two) times daily. 60 capsule 2  . vitamin E (VITAMIN E) 1000 UNIT capsule Take 1,000 Units by mouth daily.     No current facility-administered medications for this encounter.     Vitals:   10/08/17 1410  BP: 112/66  Pulse: 87  SpO2: 97%  Weight: 269 lb  (122 kg)   Wt Readings from Last 3 Encounters:  10/08/17 269 lb (122 kg)  10/01/17 276 lb (125.2 kg)  09/25/17 274 lb (124.3 kg)  PHYSICAL EXAM: General: Obese. No resp difficulty. Walked in with walker.  HEENT: Normal Neck: Supple. JVP 6-7. Carotids 2+ bilat; no bruits. No thyromegaly or nodule noted. Cor: PMI nondisplaced. RRR, No M/G/R noted Lungs: CTAB, normal effort. Abdomen: Soft, non-tender, non-distended, no HSM. No bruits or masses. +BS  Extremities: No cyanosis, clubbing, or rash. R and LLE 1+ edema. LLE red and warm to touch.  Neuro: Alert & orientedx3, cranial nerves grossly intact. moves all 4 extremities w/o difficulty. Affect pleasant  ASSESSMENT & PLAN: 1. Chronic Diastolic Heart Failure: NICM, Echo 06/2017: EF 55-60%, grade 1 DD - NYHA IIIb. Volume status mildly elevated in the setting of dietary noncompliance - Continue torsemide 60 mg BID + 2.5 mg metolazone weekly. BMET today - Continue Coreg 12.5 mg BID - Continue losartan 50 mg daily - Discussed importance of limiting fluid to < 2 L, limiting salt < 2 grams, and daily weights  2.  Uncontrolled DM:   - Follows closely with Dr Ermalene Searing. A1c 9.3 this month.   3. CAD, angina - Patient has a history of CAD with BMS in 2007.  - continue statin and aspirin.  - She had two episodes of atypical CP at rest in the last 2 months. She has been told to seek care if CP increases in severity or frequency.  - She had a low risk stress test 01/2017  4. Obesity: - Body mass index is 44.76 kg/m.  - She was given information for weight loss meeting last visit  6. HTN - Well controlled today  7. CKD Stage III - Followed by Dr Hollie Salk - BMET today  8. Day time fatigue - Agreed to scheduling sleep study  9. RLE/LLE wounds.  - RLE red and warm, with clear discharge at times per patient - Check CBC today. Encouraged her to contact PCP if worsens.  BMET, CBC today Schedule sleep study Follow up in 3 weeks   Georgiana Shore, NP 2:30 PM  Greater than 50% of the 25 minute visit was spent in counseling/coordination of care regarding disease state education, salt/fluid restriction, sliding scale diuretics, and medication compliance.

## 2017-10-08 NOTE — Patient Instructions (Signed)
Routine lab work today. Will notify you of abnormal results, otherwise no news is good news!  Will schedule you for sleep study at [facility] Address: [address], [place], [place] [address] Phone: [phone] Their office will call to schedule.  Follow up 3 weeks with Caryl Pina and Amy.  ______________________________________________________________ Susan [name] Code: [id number]  Take all medication as prescribed the day of your appointment. Bring all medications with you to your appointment.  Do the following things EVERYDAY: 1) Weigh yourself in the morning before breakfast. Write it down and keep it in a log. 2) Take your medicines as prescribed 3) Eat low salt foods-Limit salt (sodium) to 2000 mg per day.  4) Stay as active as you can everyday 5) Limit all fluids for the day to less than 2 liters

## 2017-10-13 ENCOUNTER — Other Ambulatory Visit: Payer: Self-pay | Admitting: *Deleted

## 2017-10-13 NOTE — Patient Outreach (Signed)
Attempted transition of care call. No one answered either phone number. I was able to leave a message and asked for a return call.  [name]. [name], MSN, [facility] Gerontological Nurse Practitioner [facility] Care Management [phone]

## 2017-10-15 DIAGNOSIS — R32 Unspecified urinary incontinence: Secondary | ICD-10-CM | POA: Diagnosis not present

## 2017-10-15 DIAGNOSIS — I130 Hypertensive heart and chronic kidney disease with heart failure and stage 1 through stage 4 chronic kidney disease, or unspecified chronic kidney disease: Secondary | ICD-10-CM | POA: Diagnosis not present

## 2017-10-15 DIAGNOSIS — E1122 Type 2 diabetes mellitus with diabetic chronic kidney disease: Secondary | ICD-10-CM | POA: Diagnosis not present

## 2017-10-15 DIAGNOSIS — M1991 Primary osteoarthritis, unspecified site: Secondary | ICD-10-CM | POA: Diagnosis not present

## 2017-10-15 DIAGNOSIS — E039 Hypothyroidism, unspecified: Secondary | ICD-10-CM | POA: Diagnosis not present

## 2017-10-15 DIAGNOSIS — I2510 Atherosclerotic heart disease of native coronary artery without angina pectoris: Secondary | ICD-10-CM | POA: Diagnosis not present

## 2017-10-15 DIAGNOSIS — F419 Anxiety disorder, unspecified: Secondary | ICD-10-CM | POA: Diagnosis not present

## 2017-10-16 ENCOUNTER — Other Ambulatory Visit: Payer: Self-pay | Admitting: Licensed Clinical Social Worker

## 2017-10-16 NOTE — Patient Outreach (Signed)
Assessment:  CSW spoke via phone with client. CSW verified client identity. CSW received verbal permission from client on 10/16/17 for CSW to speak with client about client needs. Client sees Dr. [name] as primary care doctor. CSW spoke with client about client care plan. CSW encouraged client to continue to use relaxation techniques of choice  in next 30 days to help client manage depression and anxiety symptoms of client. Client said she likes to watch old movies and likes to talk via phone with her daughter or other family members. Client said she did fall last Saturday at her home. She said she has been sore since the fall but is feeling better now ans said she did not have any serious injuries.  Client receives [place] support with Deloria Lair, Geriatric Nurse Practitioner.  CSW informed client that CSW would inform Deloria Lair that client fell at her home last Saturday with no serious injuries. Client agreed to this plan CSW thanked client for phone call with CSW on 10/16/17 Client was appreciative of call from [place] on 10/16/17.  Plan:  Client to continue to use relaxation techniques of choice in the next 30 days to help client manage depression and anxiety symptoms of client .   CSW to collaborate with Deloria Lair, Geriatric Nurse Practitioner, in monitoring client needs   CSW to call client in 4 weeks to assess client  needs and status at that time.   [name].Azaan Leask MSW, LCSW Licensed Clinical Social Worker [facility] Care Management [phone]

## 2017-10-19 ENCOUNTER — Other Ambulatory Visit (HOSPITAL_COMMUNITY): Payer: Self-pay | Admitting: Adult Health

## 2017-10-21 ENCOUNTER — Telehealth: Payer: Self-pay | Admitting: *Deleted

## 2017-10-21 DIAGNOSIS — I2510 Atherosclerotic heart disease of native coronary artery without angina pectoris: Secondary | ICD-10-CM | POA: Diagnosis not present

## 2017-10-21 DIAGNOSIS — I130 Hypertensive heart and chronic kidney disease with heart failure and stage 1 through stage 4 chronic kidney disease, or unspecified chronic kidney disease: Secondary | ICD-10-CM | POA: Diagnosis not present

## 2017-10-21 DIAGNOSIS — E1122 Type 2 diabetes mellitus with diabetic chronic kidney disease: Secondary | ICD-10-CM | POA: Diagnosis not present

## 2017-10-21 DIAGNOSIS — M1991 Primary osteoarthritis, unspecified site: Secondary | ICD-10-CM | POA: Diagnosis not present

## 2017-10-21 DIAGNOSIS — E039 Hypothyroidism, unspecified: Secondary | ICD-10-CM | POA: Diagnosis not present

## 2017-10-21 DIAGNOSIS — F419 Anxiety disorder, unspecified: Secondary | ICD-10-CM | POA: Diagnosis not present

## 2017-10-21 DIAGNOSIS — R32 Unspecified urinary incontinence: Secondary | ICD-10-CM | POA: Diagnosis not present

## 2017-10-21 NOTE — Telephone Encounter (Signed)
Received call from [name] [facility] SN with [place] [phone] telephone.   Requested VO to continue wound care 3x weekly x4 weeks for ulceration to lower extremity. Requested order to change dressing to Xeroform.   VO given.

## 2017-10-21 NOTE — Telephone Encounter (Signed)
noted 

## 2017-10-22 DIAGNOSIS — I130 Hypertensive heart and chronic kidney disease with heart failure and stage 1 through stage 4 chronic kidney disease, or unspecified chronic kidney disease: Secondary | ICD-10-CM | POA: Diagnosis not present

## 2017-10-22 DIAGNOSIS — R32 Unspecified urinary incontinence: Secondary | ICD-10-CM | POA: Diagnosis not present

## 2017-10-22 DIAGNOSIS — E039 Hypothyroidism, unspecified: Secondary | ICD-10-CM | POA: Diagnosis not present

## 2017-10-22 DIAGNOSIS — M1991 Primary osteoarthritis, unspecified site: Secondary | ICD-10-CM | POA: Diagnosis not present

## 2017-10-22 DIAGNOSIS — E1122 Type 2 diabetes mellitus with diabetic chronic kidney disease: Secondary | ICD-10-CM | POA: Diagnosis not present

## 2017-10-22 DIAGNOSIS — F419 Anxiety disorder, unspecified: Secondary | ICD-10-CM | POA: Diagnosis not present

## 2017-10-22 DIAGNOSIS — I2510 Atherosclerotic heart disease of native coronary artery without angina pectoris: Secondary | ICD-10-CM | POA: Diagnosis not present

## 2017-10-26 ENCOUNTER — Encounter: Payer: Self-pay | Admitting: Family Medicine

## 2017-10-26 ENCOUNTER — Ambulatory Visit (INDEPENDENT_AMBULATORY_CARE_PROVIDER_SITE_OTHER): Payer: PPO | Admitting: Family Medicine

## 2017-10-26 VITALS — BP 136/72 | HR 72 | Temp 97.9°F | Resp 16 | Ht 65.0 in | Wt 274.0 lb

## 2017-10-26 DIAGNOSIS — I739 Peripheral vascular disease, unspecified: Secondary | ICD-10-CM

## 2017-10-26 DIAGNOSIS — Z9119 Patient's noncompliance with other medical treatment and regimen: Secondary | ICD-10-CM

## 2017-10-26 DIAGNOSIS — E1165 Type 2 diabetes mellitus with hyperglycemia: Secondary | ICD-10-CM

## 2017-10-26 DIAGNOSIS — N183 Chronic kidney disease, stage 3 (moderate): Secondary | ICD-10-CM | POA: Diagnosis not present

## 2017-10-26 DIAGNOSIS — F331 Major depressive disorder, recurrent, moderate: Secondary | ICD-10-CM

## 2017-10-26 DIAGNOSIS — N184 Chronic kidney disease, stage 4 (severe): Secondary | ICD-10-CM | POA: Diagnosis not present

## 2017-10-26 DIAGNOSIS — F329 Major depressive disorder, single episode, unspecified: Secondary | ICD-10-CM

## 2017-10-26 DIAGNOSIS — F32A Depression, unspecified: Secondary | ICD-10-CM

## 2017-10-26 DIAGNOSIS — E1122 Type 2 diabetes mellitus with diabetic chronic kidney disease: Secondary | ICD-10-CM

## 2017-10-26 DIAGNOSIS — R0602 Shortness of breath: Secondary | ICD-10-CM | POA: Diagnosis not present

## 2017-10-26 DIAGNOSIS — F419 Anxiety disorder, unspecified: Secondary | ICD-10-CM | POA: Diagnosis not present

## 2017-10-26 DIAGNOSIS — R609 Edema, unspecified: Secondary | ICD-10-CM | POA: Diagnosis not present

## 2017-10-26 DIAGNOSIS — I5033 Acute on chronic diastolic (congestive) heart failure: Secondary | ICD-10-CM | POA: Diagnosis not present

## 2017-10-26 DIAGNOSIS — R269 Unspecified abnormalities of gait and mobility: Secondary | ICD-10-CM | POA: Diagnosis not present

## 2017-10-26 DIAGNOSIS — Z91199 Patient's noncompliance with other medical treatment and regimen due to unspecified reason: Secondary | ICD-10-CM

## 2017-10-26 LAB — BASIC METABOLIC PANEL
BUN/Creatinine Ratio: 31 (calc) — ABNORMAL HIGH (ref 6–22)
BUN: 47 mg/dL — ABNORMAL HIGH (ref 7–25)
CO2: 26 mmol/L (ref 20–32)
Calcium: 8.8 mg/dL (ref 8.6–10.4)
Chloride: 102 mmol/L (ref 98–110)
Creat: 1.51 mg/dL — ABNORMAL HIGH (ref 0.50–0.99)
Glucose, Bld: 311 mg/dL — ABNORMAL HIGH (ref 65–99)
Potassium: 5.1 mmol/L (ref 3.5–5.3)
Sodium: 135 mmol/L (ref 135–146)

## 2017-10-26 LAB — EXTRA LAV TOP TUBE

## 2017-10-26 MED ORDER — ESCITALOPRAM OXALATE 20 MG PO TABS: 20.0000 mg | ORAL_TABLET | Freq: Every day | ORAL | 2 refills | Status: DC

## 2017-10-26 NOTE — Patient Instructions (Addendum)
Call Dr. Les Pou your blood sugar Lexapro increased 20mg - you can take of 10mg   Nephrology appointment to be reschedule  F/u [place]

## 2017-10-26 NOTE — Assessment & Plan Note (Addendum)
unfortunately she continues to have difficulty following through with her medical treatment plan I think she would benefit from psychiatry, she states her husband wont allow it, she drives herself and knows she needs help She is exhausting resources and improvinging minimally at best lexapro increased to 20mg    Discussed importance of her appointments, needs updated vascular surgeries before putting compression wraps back on legs, she is going to call and reschedule , wounds cleaned and bandaing applied, continue [place] bite- she has scratched significantly, unable to see a specfic bite, use topical antibiotic ointment and steroid for itch  DM- recommend she call Dr. [name], states her CBG are 300-400, I have made appt for her in the past, she still does not go   Declines Sleep study, will cancel this    Will consult renal again for her CKD, missed her last appointments

## 2017-10-26 NOTE — Progress Notes (Signed)
Subjective:    Patient ID: Susan [name], female    DOB: 1950-06-03, 67 y.o.   MRN: [id number]  Patient presents for Follow-up (is not fasting) and Rash (back of R hand- was stung by wasp- has used Benadryl)   She cancelled Sleep study - states husband said she wouldn't wear the maks   Also cancelled vascular studies with TBI  Periperal edema/venous- used neosporin, legs have been weeping for a few weeks   Nurse- wrapping legs with gauze, kerlix as we were still awaiting her vascular studies which she cancelled   Depression- taking lexapro 10mg  once a day ,does not feel like it is helping  Stung by a wasp, last wed, still has some itching , has been scratching at  On right hand ventral surface   DM- has not seen Dr. [name], states she called, they told her if sugars were good, she can wait??? Has appt this month   Renal function- up to 2.34 on 5/16   Overdue for nephrology appointment     Review Of Systems:  GEN- denies fatigue, fever, weight loss,weakness, recent illness HEENT- denies eye drainage, change in vision, nasal discharge, CVS- denies chest pain, palpitations RESP- denies SOB, cough, wheeze ABD- denies N/V, change in stools, abd pain GU- denies dysuria, hematuria, dribbling, incontinence MSK- + joint pain, muscle aches, injury Neuro- denies headache, dizziness, syncope, seizure activity       Objective:    BP 136/72   Pulse 72   Temp 97.9 F (36.6 C) (Oral)   Resp 16   Ht 5\' 5"  (1.651 m)   Wt 274 lb (124.3 kg)   SpO2 96%   BMI 45.60 kg/m  GEN- NAD, alert and oriented x3,obese HEENT- PERRL, EOMI, non injected sclera, pink conjunctiva, MMM, oropharynx clear Neck- Supple, no thyromegaly CVS- RRR, no murmur RESP-CTAB ABD-NABS,soft,NT,ND Psych- normal affect and mood, a little depressed appearing, not anxious,  EXT- chronic venous stasis, pitting edema, weeping, scattered open superficial scabs, she pulled bandaids off, causing bleeding to 2 lesions on  left leg  Skin- multiple excoriations, with dry scabs on back of right hand, no significant erythema Pulses- Radial 2+ DP-diminished        Assessment & Plan:      Problem List Items Addressed This Visit      Unprioritized   Non compliance with medical treatment   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 mellitus ([place])   Relevant Orders   Ambulatory referral to Nephrology   Morbid obesity ([place]) (Chronic)   Peripheral edema   MDD (major depressive disorder)   Relevant Medications   escitalopram (LEXAPRO) 20 MG tablet   Diabetes mellitus type II, uncontrolled ([place]) - Primary   Relevant Orders   Basic metabolic panel   PVD (peripheral vascular disease) ([place])    unfortunately she continues to have difficulty following through with her medical treatment plan I think she would benefit from psychiatry, she states her husband wont allow it, she drives herself and knows she needs help She is exhausting resources and improvinging minimally at best lexapro increased to 20mg    Discussed importance of her appointments, needs updated vascular surgeries before putting compression wraps back on legs, she is going to call and reschedule , wounds cleaned and bandaing applied, continue [place]  Bug bite- she has scratched significantly, unable to see a specfic bite, use topical antibiotic ointment and steroid for itch  DM- recommend she call Dr. [name], states her CBG are 300-400, I  have made appt for her in the past, she still does not go   Declines Sleep study, will cancel this    Will consult renal again for her CKD, missed her last appointments        Other Visit Diagnoses    Anxiety and depression       Relevant Medications   escitalopram (LEXAPRO) 20 MG tablet      Note: This dictation was prepared with Dragon dictation along with smaller phrase technology. Any transcriptional errors that result from this process are unintentional.

## 2017-10-27 ENCOUNTER — Ambulatory Visit (HOSPITAL_COMMUNITY)
Admission: RE | Admit: 2017-10-27 | Discharge: 2017-10-27 | Disposition: A | Discharge status: Home / Self Care | Payer: PPO | Source: Ambulatory Visit | Attending: Cardiology | Admitting: Cardiology

## 2017-10-27 ENCOUNTER — Encounter: Payer: Self-pay | Admitting: *Deleted

## 2017-10-27 VITALS — BP 154/88 | HR 94 | Wt 275.8 lb

## 2017-10-27 DIAGNOSIS — Z7982 Long term (current) use of aspirin: Secondary | ICD-10-CM | POA: Diagnosis not present

## 2017-10-27 DIAGNOSIS — R5383 Other fatigue: Secondary | ICD-10-CM | POA: Insufficient documentation

## 2017-10-27 DIAGNOSIS — I25119 Atherosclerotic heart disease of native coronary artery with unspecified angina pectoris: Secondary | ICD-10-CM | POA: Diagnosis not present

## 2017-10-27 DIAGNOSIS — E1165 Type 2 diabetes mellitus with hyperglycemia: Secondary | ICD-10-CM | POA: Insufficient documentation

## 2017-10-27 DIAGNOSIS — N184 Chronic kidney disease, stage 4 (severe): Secondary | ICD-10-CM

## 2017-10-27 DIAGNOSIS — Z6841 Body Mass Index (BMI) 40.0 and over, adult: Secondary | ICD-10-CM | POA: Insufficient documentation

## 2017-10-27 DIAGNOSIS — I130 Hypertensive heart and chronic kidney disease with heart failure and stage 1 through stage 4 chronic kidney disease, or unspecified chronic kidney disease: Secondary | ICD-10-CM | POA: Diagnosis not present

## 2017-10-27 DIAGNOSIS — E669 Obesity, unspecified: Secondary | ICD-10-CM | POA: Diagnosis not present

## 2017-10-27 DIAGNOSIS — F419 Anxiety disorder, unspecified: Secondary | ICD-10-CM | POA: Diagnosis not present

## 2017-10-27 DIAGNOSIS — I5032 Chronic diastolic (congestive) heart failure: Secondary | ICD-10-CM | POA: Diagnosis not present

## 2017-10-27 DIAGNOSIS — M1990 Unspecified osteoarthritis, unspecified site: Secondary | ICD-10-CM | POA: Insufficient documentation

## 2017-10-27 DIAGNOSIS — I429 Cardiomyopathy, unspecified: Secondary | ICD-10-CM | POA: Diagnosis not present

## 2017-10-27 DIAGNOSIS — E1140 Type 2 diabetes mellitus with diabetic neuropathy, unspecified: Secondary | ICD-10-CM | POA: Insufficient documentation

## 2017-10-27 DIAGNOSIS — Z8673 Personal history of transient ischemic attack (TIA), and cerebral infarction without residual deficits: Secondary | ICD-10-CM | POA: Insufficient documentation

## 2017-10-27 DIAGNOSIS — Z794 Long term (current) use of insulin: Secondary | ICD-10-CM | POA: Insufficient documentation

## 2017-10-27 DIAGNOSIS — E1122 Type 2 diabetes mellitus with diabetic chronic kidney disease: Secondary | ICD-10-CM | POA: Insufficient documentation

## 2017-10-27 DIAGNOSIS — E785 Hyperlipidemia, unspecified: Secondary | ICD-10-CM | POA: Insufficient documentation

## 2017-10-27 DIAGNOSIS — N183 Chronic kidney disease, stage 3 (moderate): Secondary | ICD-10-CM | POA: Diagnosis not present

## 2017-10-27 DIAGNOSIS — I872 Venous insufficiency (chronic) (peripheral): Secondary | ICD-10-CM | POA: Diagnosis not present

## 2017-10-27 DIAGNOSIS — Z87891 Personal history of nicotine dependence: Secondary | ICD-10-CM | POA: Insufficient documentation

## 2017-10-27 DIAGNOSIS — Z006 Encounter for examination for normal comparison and control in clinical research program: Secondary | ICD-10-CM

## 2017-10-27 DIAGNOSIS — Z79899 Other long term (current) drug therapy: Secondary | ICD-10-CM | POA: Diagnosis not present

## 2017-10-27 DIAGNOSIS — E039 Hypothyroidism, unspecified: Secondary | ICD-10-CM | POA: Diagnosis not present

## 2017-10-27 DIAGNOSIS — I4510 Unspecified right bundle-branch block: Secondary | ICD-10-CM | POA: Insufficient documentation

## 2017-10-27 DIAGNOSIS — F329 Major depressive disorder, single episode, unspecified: Secondary | ICD-10-CM | POA: Insufficient documentation

## 2017-10-27 DIAGNOSIS — I252 Old myocardial infarction: Secondary | ICD-10-CM | POA: Insufficient documentation

## 2017-10-27 DIAGNOSIS — R0602 Shortness of breath: Secondary | ICD-10-CM | POA: Diagnosis not present

## 2017-10-27 DIAGNOSIS — Z7902 Long term (current) use of antithrombotics/antiplatelets: Secondary | ICD-10-CM | POA: Insufficient documentation

## 2017-10-27 NOTE — Patient Instructions (Signed)
Your physician recommends that you schedule a follow-up appointment in: 4 weeks  Do the following things EVERYDAY: 1) Weigh yourself in the morning before breakfast. Write it down and keep it in a log. 2) Take your medicines as prescribed 3) Eat low salt foods-Limit salt (sodium) to 2000 mg per day.  4) Stay as active as you can everyday 5) Limit all fluids for the day to less than 2 liters

## 2017-10-27 NOTE — Progress Notes (Signed)
fPatient ID: Susan [name], female   DOB: 1950-10-20, 67 y.o.   MRN: [id number]   PCP: Dr Scot Dock  Cardiologist: Dr Ron Parker  Endocrinologist: Dr Bubba Camp  Primary HF Cardiologist: Dr. Haroldine Laws  Nephrology: Dr Hollie Salk  [facility] Patient  HPI: Susan [name] is a 67 year old with a history of RBBB, DM, Hypothyroid, diastolic heart failure, CAD MI 2000/2007 BMS 2007, TIA, obesity.    Admitted June 1st through June 3rd, 2017 with altered mental status, hypothermia, and weakness. Diuretics initially held and later restarted.   Admitted 2/27-07/28/17 for AKI. Torsemide was held and restarted at 60 mg BID. She had RHC 07/24/17 that showed mild PAH with evidence of RV strain likely due to OHS/OSA. She was discharged to SNF.  Today she returns for HF follow up. Overall feeling fine. SOB with exertion. Denies  PND/Orthopnea. Appetite ok. Eating out every day.  Ongoing leg edema. No fever or chills. Weight at home 275-280 pounds. Taking all medications.  ECHO 07/11/2015- 50-55%.  Grade I DD Echo 07/23/17: EF 55-60%, grade 1 DD  RHC 07/24/17: RA = 17 RV = 48/15 PA = 49/11 (30) PCW = 18 Fick cardiac output/index = 7.0/3.0 PVR = 0.5 WU Ao sat = 97% PA sat = 62%, 64% 1. Normal left-sided pressures 2. Mild PAH with evidence of RV strain likely due to OHS/OSA 3. Normal cardiac output  RHC 09/22/13: RA = 4  RV = 30/5/7  PA = 25/10 (16)  PCW = 7  Fick cardiac output/index = 6.3/2.7  PVR = 1.5 WU   FA sat = 93%  PA sat = 61%, 66%   SH: Former smoker quit 15 year ago. Does drink alcohol. She is disabled. Lives at home with her husband and disabled son - Husband is truck Geophysicist/field seismologist. Son has a brain injury after he attempted suicide  A few years ago. .    FH: Mom MI  Review of systems complete and found to be negative unless listed in HPI.   Past Medical History:  Diagnosis Date  . Acute MI ([place]) 1999; 2007  . Anemia    hx  . Anginal pain ([place])   . Anxiety   . ARF (acute renal failure) ([place]) 06/2017  . Arthritis     "generalized" (03/15/2014)  . CAD (coronary artery disease)    MI in 2000 - MI  2007 - treated bare metal stent (no nuclear since then as 9/11)  . Carotid artery disease ([place])   . CHF (congestive heart failure) ([place])   . Chronic diastolic heart failure (H[facility])    a) ECHO (08/2013) EF 55-60% and RV function nl b) RHC (08/2013) RA 4, RV 30/5/7, PA 25/10 (16), PCWP 7, Fick CO/CI 6.3/2.7, PVR 1.5 WU, PA 61 and 66%  . Daily headache    "~ every other day; since I fell in June" (03/15/2014)  . Depression   . Dyslipidemia   . Exertional shortness of breath   . HTN (hypertension)   . Hypothyroidism   . Obesity   . Osteoarthritis   . Peripheral neuropathy   . PONV (postoperative nausea and vomiting)   . RBBB (right bundle branch block)    Old  . Renal insufficiency    DENIES  . Stroke [facility])    mini strokes  . Syncope    likely due to low blood sugar  . Tachycardia    Sinus tachycardia  . Type II diabetes mellitus ([place])   . Urinary incontinence   . Venous insufficiency  Current Outpatient Medications  Medication Sig Dispense Refill  . albuterol (PROVENTIL HFA;VENTOLIN HFA) 108 (90 BASE) MCG/ACT inhaler Inhale 2 puffs into the lungs every 6 (six) hours as needed for wheezing or shortness of breath. Reported on 10/30/2015    . aspirin EC 81 MG tablet Take 81 mg by mouth daily.    . carvedilol (COREG) 12.5 MG tablet TAKE 1 TABLET BY MOUTH TWICE A DAY WITH A MEAL 90 tablet 1  . cholecalciferol (VITAMIN D) 1000 units tablet Take 1,000 Units by mouth daily.    . clopidogrel (PLAVIX) 75 MG tablet TAKE 1 TABLET (75 MG TOTAL) BY MOUTH DAILY WITH BREAKFAST. 90 tablet 2  . escitalopram (LEXAPRO) 20 MG tablet Take 1 tablet (20 mg total) by mouth daily. 90 tablet 2  . ferrous sulfate 325 (65 FE) MG tablet Take 325 mg by mouth daily with breakfast. Reported on 08/21/2015    . insulin NPH Human (HUMULIN N,NOVOLIN N) 100 UNIT/ML injection Inject 90 units in the morning and 6 units at bedtime 10 mL 0    . isosorbide dinitrate (ISORDIL) 10 MG tablet TAKE 1 TABLET BY MOUTH TWICE A DAY 60 tablet 6  . levothyroxine (SYNTHROID, LEVOTHROID) 50 MCG tablet Take 1 tablet (50 mcg total) by mouth daily before breakfast. 90 tablet 1  . losartan (COZAAR) 50 MG tablet TAKE 1 TABLET BY MOUTH EVERY DAY 90 tablet 1  . Magnesium Oxide 400 (240 Mg) MG TABS TAKE 1 TABLET BY MOUTH EVERY DAY (Patient taking differently: TAKE 240 mg TABLET BY MOUTH EVERY DAY) 90 tablet 3  . metolazone (ZAROXOLYN) 5 MG tablet Take 1 tablet (5 mg total) by mouth once a week. 12 tablet 0  . nitroGLYCERIN (NITROSTAT) 0.4 MG SL tablet PLACE 1 TABLET (0.4 MG TOTAL) UNDER THE TONGUE EVERY 5 (FIVE) MINUTES AS NEEDED FOR CHEST PAIN. 25 tablet 1  . pregabalin (LYRICA) 150 MG capsule Take 1 capsule (150 mg total) by mouth 2 (two) times daily. 60 capsule 2  . rosuvastatin (CRESTOR) 20 MG tablet Take 1 tablet (20 mg total) by mouth daily. 90 tablet 3  . torsemide (DEMADEX) 20 MG tablet TAKE 3 TABLETS (60 MG TOTAL) BY MOUTH 2 (TWO) TIMES DAILY. 180 tablet 0  . vitamin E (VITAMIN E) 1000 UNIT capsule Take 1,000 Units by mouth daily.     No current facility-administered medications for this encounter.     Vitals:   10/27/17 1459  BP: (!) 154/88  Pulse: 94  SpO2: 95%  Weight: 275 lb 12.8 oz (125.1 kg)   Wt Readings from Last 3 Encounters:  10/27/17 275 lb 12.8 oz (125.1 kg)  10/26/17 274 lb (124.3 kg)  10/08/17 269 lb (122 kg)    PHYSICAL EXAM: General:  Appears chronically ill. No resp difficulty HEENT: normal Neck: supple. JVP 7-8. Carotids 2+ bilat; no bruits. No lymphadenopathy or thryomegaly appreciated. Cor: PMI nondisplaced. Regular rate & rhythm. No rubs, gallops or murmurs. Lungs: clear Abdomen: obese, soft, nontender, nondistended. No hepatosplenomegaly. No bruits or masses. Good bowel sounds. Extremities: no cyanosis, clubbing, rash, R and LLE 1+ edema Neuro: alert & orientedx3, cranial nerves grossly intact. moves all 4  extremities w/o difficulty. Affect pleasant   ASSESSMENT & PLAN: 1. Chronic Diastolic Heart Failure: NICM, Echo 06/2017: EF 55-60%, grade 1 DD - NYHA IIIb. Volume status stable. Continue torsemide 60 mg BID + 2.5 mg metolazone weekly.  - Continue Coreg 12.5 mg BID - Continue losartan 50 mg daily - Discussed fluid restrictions  and low salt food choices.   2.  Uncontrolled DM:  - Follows closely with Dr Bubba Camp  -Hgb 9.3 .   3. CAD, angina - Patient has a history of CAD with BMS in 2007.  - continue statin and aspirin.  - She had two episodes of atypical CP at rest in the last 2 months. She has been told to seek care if CP increases in severity or frequency.  - She had a low risk stress test 01/2017 - No S/S ischemia   4. Obesity: - Body mass index is 45.9 kg/m.  - Discussed portion control.   6. HTN - Well controlled today  7. CKD Stage III - Followed by Dr Hollie Salk - I have asked her to follow up with Dr Hollie Salk. Needs to reschedule.    8. Day time fatigue -She does not want a sleep study.   9. RLE/LLE wounds.  -She as ABIs set up.   Follow up in 4 weeks.Reinforced low salt food choices and limiting fluid intake ot < 2 liters per day. Darrick Grinder, NP 3:04 PM

## 2017-10-28 NOTE — Progress Notes (Signed)
RADPH Informed Consent           Subject Name:  Susan [name]   Subject met inclusion and exclusion criteria.  The informed consent form, study requirements and expectations were reviewed with the subject and questions and concerns were addressed prior to the signing of the consent form.  The subject verbalized understanding of the trial requirements.  The subject agreed to participate in the RADPH trial and signed the informed consent.  The informed consent was obtained prior to performance of any protocol-specific procedures for the subject.  A copy of the signed informed consent was given to the subject and a copy was placed in the subject's medical record.    , Research Assistant 10/27/2017 15:33 

## 2017-10-29 ENCOUNTER — Other Ambulatory Visit: Payer: Self-pay | Admitting: *Deleted

## 2017-10-29 NOTE — Patient Outreach (Signed)
Transition of care call. Pt reports she is doing fair. She requests advocacy to find out when to expect her home health nurse for ongoing wound care and also assistance in rescheduling an appointment with Dr. Hollie Salk at [facility].  She reports she has her transfer bench and hand held shower head but is too afraid to use it. She also reports that she is unable to clean the tub out and her husband won't do it.  I reinforced safety precautions today as she is spending time alone.  I've called [facility] and left a message to return my call and I've called [facility] and they will call pt to reschedule her appt.  I will call her again in a month and have reinforced that if she has trouble getting services she is in need of.  [name]. [name], MSN, [facility] Gerontological Nurse Practitioner [facility] Care Management [phone]

## 2017-11-03 ENCOUNTER — Other Ambulatory Visit: Payer: Self-pay | Admitting: *Deleted

## 2017-11-03 ENCOUNTER — Telehealth: Payer: Self-pay | Admitting: *Deleted

## 2017-11-03 DIAGNOSIS — I739 Peripheral vascular disease, unspecified: Secondary | ICD-10-CM

## 2017-11-03 NOTE — Telephone Encounter (Signed)
New orders placed with PVD as Dx.   Call placed to [facility] to make aware.   LM on VM.

## 2017-11-03 NOTE — Telephone Encounter (Signed)
Use PVD

## 2017-11-03 NOTE — Telephone Encounter (Signed)
Received call from [facility] with Cardiovascular Imaging [phone]~ telephone.   States that patient is schedule on 11/04/2017 for ABI. Reports that Dx is BLE edema. States that this Dx is generally used with venous testing and not arterial. Inquired as to if ABI were meant to be ordered, and if so, requested new Dx.   MD please advise.

## 2017-11-04 ENCOUNTER — Ambulatory Visit (HOSPITAL_COMMUNITY)
Admission: RE | Admit: 2017-11-04 | Discharge: 2017-11-04 | Disposition: A | Discharge status: Home / Self Care | Payer: PPO | Source: Ambulatory Visit | Attending: Family Medicine | Admitting: Family Medicine

## 2017-11-04 DIAGNOSIS — Z87891 Personal history of nicotine dependence: Secondary | ICD-10-CM | POA: Diagnosis not present

## 2017-11-04 DIAGNOSIS — E785 Hyperlipidemia, unspecified: Secondary | ICD-10-CM | POA: Insufficient documentation

## 2017-11-04 DIAGNOSIS — I10 Essential (primary) hypertension: Secondary | ICD-10-CM | POA: Insufficient documentation

## 2017-11-04 DIAGNOSIS — R9389 Abnormal findings on diagnostic imaging of other specified body structures: Secondary | ICD-10-CM | POA: Insufficient documentation

## 2017-11-04 DIAGNOSIS — I2510 Atherosclerotic heart disease of native coronary artery without angina pectoris: Secondary | ICD-10-CM | POA: Diagnosis not present

## 2017-11-04 DIAGNOSIS — E1151 Type 2 diabetes mellitus with diabetic peripheral angiopathy without gangrene: Secondary | ICD-10-CM | POA: Insufficient documentation

## 2017-11-04 DIAGNOSIS — I739 Peripheral vascular disease, unspecified: Secondary | ICD-10-CM

## 2017-11-10 DIAGNOSIS — I2510 Atherosclerotic heart disease of native coronary artery without angina pectoris: Secondary | ICD-10-CM | POA: Diagnosis not present

## 2017-11-10 DIAGNOSIS — F419 Anxiety disorder, unspecified: Secondary | ICD-10-CM | POA: Diagnosis not present

## 2017-11-10 DIAGNOSIS — M1991 Primary osteoarthritis, unspecified site: Secondary | ICD-10-CM | POA: Diagnosis not present

## 2017-11-10 DIAGNOSIS — E1122 Type 2 diabetes mellitus with diabetic chronic kidney disease: Secondary | ICD-10-CM | POA: Diagnosis not present

## 2017-11-10 DIAGNOSIS — E039 Hypothyroidism, unspecified: Secondary | ICD-10-CM | POA: Diagnosis not present

## 2017-11-10 DIAGNOSIS — I130 Hypertensive heart and chronic kidney disease with heart failure and stage 1 through stage 4 chronic kidney disease, or unspecified chronic kidney disease: Secondary | ICD-10-CM | POA: Diagnosis not present

## 2017-11-10 DIAGNOSIS — R32 Unspecified urinary incontinence: Secondary | ICD-10-CM | POA: Diagnosis not present

## 2017-11-12 ENCOUNTER — Other Ambulatory Visit: Payer: Self-pay | Admitting: *Deleted

## 2017-11-13 DIAGNOSIS — I87319 Chronic venous hypertension (idiopathic) with ulcer of unspecified lower extremity: Secondary | ICD-10-CM | POA: Diagnosis not present

## 2017-11-13 DIAGNOSIS — I5033 Acute on chronic diastolic (congestive) heart failure: Secondary | ICD-10-CM | POA: Diagnosis not present

## 2017-11-13 DIAGNOSIS — R269 Unspecified abnormalities of gait and mobility: Secondary | ICD-10-CM | POA: Diagnosis not present

## 2017-11-13 DIAGNOSIS — R0602 Shortness of breath: Secondary | ICD-10-CM | POA: Diagnosis not present

## 2017-11-13 DIAGNOSIS — N183 Chronic kidney disease, stage 3 (moderate): Secondary | ICD-10-CM | POA: Diagnosis not present

## 2017-11-13 NOTE — Patient Outreach (Addendum)
Called pt to follow up on her disease management diabetes and CHF. She was not at home and I left a message to return my call. I do see she saw Darrick Grinder, NP in the HF Clinic and was stable and no changes to her medical regimen. Pt is to see her endocrinologist, Dr. Suzette Battiest next week. Hoping to see a drop in her HgbA1C from 9.3.  [name]. [name], MSN, GNP-BC Gerontological Nurse Practitioner [facility] Care Management [phone]  Pt returned my call. She says she is doing fair. She is having unnaboots applied weekly to assist with wound healing and compression. She reports she did see Amy at HF cliinic and has an appt to see a nephrologist next week and endocrinology.  She states she had a spell where her glucose went up in the 400s and she communicated this to Dr. Suzette Battiest who increased her NPH insulin to 45 units in am, she was to continue 6 units at night and use a sliding scale at meal time.  She reports she has a hard time sticking to her diet. I counseled her on this is a choice, YOUR CHOICE! You can choose to improve your quality of life without diabetic complications or NOT which could lead to amputations, blindness and going on dialysis which would be a very poor quality of life. I encouraged her stating, "I know you can do it! You dropped your A1C 2 grams this year!"  I cheered for her, "You can do it [place], [place], [place]!"  She appreciates the support and states she will try to do better.  I will call her in 2 weeks.  [name]. [name], MSN, [facility] Gerontological Nurse Practitioner [facility] Care Management [phone]

## 2017-11-16 ENCOUNTER — Telehealth: Payer: Self-pay | Admitting: *Deleted

## 2017-11-16 DIAGNOSIS — D638 Anemia in other chronic diseases classified elsewhere: Secondary | ICD-10-CM | POA: Diagnosis not present

## 2017-11-16 DIAGNOSIS — I509 Heart failure, unspecified: Secondary | ICD-10-CM | POA: Diagnosis not present

## 2017-11-16 DIAGNOSIS — E1122 Type 2 diabetes mellitus with diabetic chronic kidney disease: Secondary | ICD-10-CM | POA: Diagnosis not present

## 2017-11-16 DIAGNOSIS — R32 Unspecified urinary incontinence: Secondary | ICD-10-CM | POA: Diagnosis not present

## 2017-11-16 DIAGNOSIS — F419 Anxiety disorder, unspecified: Secondary | ICD-10-CM | POA: Diagnosis not present

## 2017-11-16 DIAGNOSIS — M1991 Primary osteoarthritis, unspecified site: Secondary | ICD-10-CM | POA: Diagnosis not present

## 2017-11-16 DIAGNOSIS — N184 Chronic kidney disease, stage 4 (severe): Secondary | ICD-10-CM | POA: Diagnosis not present

## 2017-11-16 DIAGNOSIS — Z7951 Long term (current) use of inhaled steroids: Secondary | ICD-10-CM | POA: Diagnosis not present

## 2017-11-16 DIAGNOSIS — E1140 Type 2 diabetes mellitus with diabetic neuropathy, unspecified: Secondary | ICD-10-CM | POA: Diagnosis not present

## 2017-11-16 DIAGNOSIS — I2510 Atherosclerotic heart disease of native coronary artery without angina pectoris: Secondary | ICD-10-CM | POA: Diagnosis not present

## 2017-11-16 DIAGNOSIS — I5032 Chronic diastolic (congestive) heart failure: Secondary | ICD-10-CM | POA: Diagnosis not present

## 2017-11-16 DIAGNOSIS — E039 Hypothyroidism, unspecified: Secondary | ICD-10-CM | POA: Diagnosis not present

## 2017-11-16 DIAGNOSIS — Z794 Long term (current) use of insulin: Secondary | ICD-10-CM | POA: Diagnosis not present

## 2017-11-16 DIAGNOSIS — R296 Repeated falls: Secondary | ICD-10-CM | POA: Diagnosis not present

## 2017-11-16 DIAGNOSIS — Z7902 Long term (current) use of antithrombotics/antiplatelets: Secondary | ICD-10-CM | POA: Diagnosis not present

## 2017-11-16 DIAGNOSIS — I129 Hypertensive chronic kidney disease with stage 1 through stage 4 chronic kidney disease, or unspecified chronic kidney disease: Secondary | ICD-10-CM | POA: Diagnosis not present

## 2017-11-16 DIAGNOSIS — G4733 Obstructive sleep apnea (adult) (pediatric): Secondary | ICD-10-CM | POA: Diagnosis not present

## 2017-11-16 DIAGNOSIS — I130 Hypertensive heart and chronic kidney disease with heart failure and stage 1 through stage 4 chronic kidney disease, or unspecified chronic kidney disease: Secondary | ICD-10-CM | POA: Diagnosis not present

## 2017-11-16 NOTE — Telephone Encounter (Signed)
Received call from [name] with [place] [phone] telephone.   Requested VO to re-certify patient 1x weekly x6 weeks for UNNA boots. VO given.

## 2017-11-17 ENCOUNTER — Other Ambulatory Visit: Payer: Self-pay | Admitting: *Deleted

## 2017-11-17 ENCOUNTER — Other Ambulatory Visit: Payer: Self-pay | Admitting: Licensed Clinical Social Worker

## 2017-11-17 NOTE — Telephone Encounter (Signed)
Agree with above 

## 2017-11-17 NOTE — Patient Outreach (Signed)
Assessment:  CSW spoke via phone with clientt. CSW verified client identity. CSw received verbal permission from client for CSW to speak with client about client needs. CSW spoke with client abut client care plan.  CSW encouraged client to continue to use relaxation techniques of choice in the  next 30 days to help client manage depression and anxiety symptoms of client. Client said she relaxes by watching old movies. She relaxes by talking via phone with her daughter or other family members.  She said she likes spending times with her pets as a way to relax. Client said a nurse from [facility] comes to client's home one time weekly for wound care.  Client is wearing una boots on both legs as prescribed.  She went to nephrologist yesterday for appointment and medical provider ordered her to take an antibiotic.  Client said she will try to pick up antibiotic today. She has some pain in her legs. She said her left leg is not healing for sores ans sore spots as well as the right leg is healing. Again client gets weekly visits from nurse with [facility].  She uses a walker to ambulate.  She said she sometimes has trouble with balance issues. She said she has some help in the home from her son.   CSW thanked client for phone call with CSW on 11/17/17. CSW encouraged client to call CSW as needed to discuss social work needs of client.   Plan:  Client to continue to use relaxation techniques of choice in the next 30 days to help client  manage depression and anxiety symptoms.   CSW to collaborate with Knox Saliva, Geriatric Nurse Practitioner, in monitoring client needs.   CSW to call client in 4 weeks to assess client needs and status at that time.   [name].Keeven Matty MSW, LCSW Licensed Clinical Social Worker [facility] Care Management [phone]

## 2017-11-18 ENCOUNTER — Other Ambulatory Visit (HOSPITAL_COMMUNITY): Payer: Self-pay | Admitting: Internal Medicine

## 2017-11-19 DIAGNOSIS — I10 Essential (primary) hypertension: Secondary | ICD-10-CM | POA: Diagnosis not present

## 2017-11-19 DIAGNOSIS — E039 Hypothyroidism, unspecified: Secondary | ICD-10-CM | POA: Diagnosis not present

## 2017-11-19 DIAGNOSIS — E1165 Type 2 diabetes mellitus with hyperglycemia: Secondary | ICD-10-CM | POA: Diagnosis not present

## 2017-11-19 DIAGNOSIS — G609 Hereditary and idiopathic neuropathy, unspecified: Secondary | ICD-10-CM | POA: Diagnosis not present

## 2017-11-23 ENCOUNTER — Other Ambulatory Visit (HOSPITAL_COMMUNITY): Payer: Self-pay | Admitting: Cardiology

## 2017-11-24 ENCOUNTER — Encounter (HOSPITAL_COMMUNITY): Payer: Self-pay

## 2017-11-24 ENCOUNTER — Ambulatory Visit (HOSPITAL_COMMUNITY)
Admission: RE | Admit: 2017-11-24 | Discharge: 2017-11-24 | Disposition: A | Discharge status: Home / Self Care | Payer: PPO | Source: Ambulatory Visit | Attending: Internal Medicine | Admitting: Internal Medicine

## 2017-11-24 VITALS — BP 144/68 | HR 87 | Wt 275.2 lb

## 2017-11-24 DIAGNOSIS — E669 Obesity, unspecified: Secondary | ICD-10-CM | POA: Diagnosis not present

## 2017-11-24 DIAGNOSIS — Z7982 Long term (current) use of aspirin: Secondary | ICD-10-CM | POA: Diagnosis not present

## 2017-11-24 DIAGNOSIS — I130 Hypertensive heart and chronic kidney disease with heart failure and stage 1 through stage 4 chronic kidney disease, or unspecified chronic kidney disease: Secondary | ICD-10-CM | POA: Insufficient documentation

## 2017-11-24 DIAGNOSIS — E1165 Type 2 diabetes mellitus with hyperglycemia: Secondary | ICD-10-CM | POA: Diagnosis not present

## 2017-11-24 DIAGNOSIS — I5032 Chronic diastolic (congestive) heart failure: Secondary | ICD-10-CM | POA: Diagnosis not present

## 2017-11-24 DIAGNOSIS — E1140 Type 2 diabetes mellitus with diabetic neuropathy, unspecified: Secondary | ICD-10-CM | POA: Diagnosis not present

## 2017-11-24 DIAGNOSIS — F329 Major depressive disorder, single episode, unspecified: Secondary | ICD-10-CM | POA: Diagnosis not present

## 2017-11-24 DIAGNOSIS — I872 Venous insufficiency (chronic) (peripheral): Secondary | ICD-10-CM | POA: Insufficient documentation

## 2017-11-24 DIAGNOSIS — N183 Chronic kidney disease, stage 3 (moderate): Secondary | ICD-10-CM | POA: Diagnosis not present

## 2017-11-24 DIAGNOSIS — Z87891 Personal history of nicotine dependence: Secondary | ICD-10-CM | POA: Diagnosis not present

## 2017-11-24 DIAGNOSIS — R0602 Shortness of breath: Secondary | ICD-10-CM | POA: Insufficient documentation

## 2017-11-24 DIAGNOSIS — R531 Weakness: Secondary | ICD-10-CM | POA: Insufficient documentation

## 2017-11-24 DIAGNOSIS — E785 Hyperlipidemia, unspecified: Secondary | ICD-10-CM | POA: Insufficient documentation

## 2017-11-24 DIAGNOSIS — Z794 Long term (current) use of insulin: Secondary | ICD-10-CM | POA: Diagnosis not present

## 2017-11-24 DIAGNOSIS — R4182 Altered mental status, unspecified: Secondary | ICD-10-CM | POA: Diagnosis not present

## 2017-11-24 DIAGNOSIS — I252 Old myocardial infarction: Secondary | ICD-10-CM | POA: Insufficient documentation

## 2017-11-24 DIAGNOSIS — Z79899 Other long term (current) drug therapy: Secondary | ICD-10-CM | POA: Insufficient documentation

## 2017-11-24 DIAGNOSIS — I25119 Atherosclerotic heart disease of native coronary artery with unspecified angina pectoris: Secondary | ICD-10-CM | POA: Diagnosis not present

## 2017-11-24 DIAGNOSIS — N179 Acute kidney failure, unspecified: Secondary | ICD-10-CM | POA: Insufficient documentation

## 2017-11-24 DIAGNOSIS — F419 Anxiety disorder, unspecified: Secondary | ICD-10-CM | POA: Insufficient documentation

## 2017-11-24 DIAGNOSIS — E1122 Type 2 diabetes mellitus with diabetic chronic kidney disease: Secondary | ICD-10-CM | POA: Insufficient documentation

## 2017-11-24 DIAGNOSIS — Z8673 Personal history of transient ischemic attack (TIA), and cerebral infarction without residual deficits: Secondary | ICD-10-CM | POA: Insufficient documentation

## 2017-11-24 DIAGNOSIS — N184 Chronic kidney disease, stage 4 (severe): Secondary | ICD-10-CM

## 2017-11-24 DIAGNOSIS — M1990 Unspecified osteoarthritis, unspecified site: Secondary | ICD-10-CM | POA: Diagnosis not present

## 2017-11-24 DIAGNOSIS — E039 Hypothyroidism, unspecified: Secondary | ICD-10-CM | POA: Insufficient documentation

## 2017-11-24 DIAGNOSIS — I4510 Unspecified right bundle-branch block: Secondary | ICD-10-CM | POA: Diagnosis not present

## 2017-11-24 DIAGNOSIS — I10 Essential (primary) hypertension: Secondary | ICD-10-CM | POA: Diagnosis not present

## 2017-11-24 DIAGNOSIS — Z6841 Body Mass Index (BMI) 40.0 and over, adult: Secondary | ICD-10-CM | POA: Insufficient documentation

## 2017-11-24 NOTE — Patient Instructions (Signed)
Follow up in 4 weeks as scheduled.

## 2017-11-24 NOTE — Progress Notes (Signed)
fPatient ID: Susan [name], female   DOB: [birth year], 67 y.o.   MRN: [id number]   PCP: Dr Scot Dock  Cardiologist: Dr Ron Parker  Endocrinologist: Dr Bubba Camp  Primary HF Cardiologist: Dr. Haroldine Laws  Nephrology: Dr Hollie Salk  [facility] Patient  HPI: Susan [name] is a 67 year old with a history of RBBB, DM, Hypothyroid, diastolic heart failure, CAD MI 2000/2007 BMS 2007, TIA, obesity.    Admitted June 1st through June 3rd, 2017 with altered mental status, hypothermia, and weakness. Diuretics initially held and later restarted.   Admitted 2/27-07/28/17 for AKI. Torsemide was held and restarted at 60 mg BID. She had RHC 07/24/17 that showed mild PAH with evidence of RV strain likely due to OHS/OSA. She was discharged to SNF.  Today she returns for HF follow up. Overall feeling fine. Remains SOB with exertion. + Orthopnea. Denies PND. Trying to eat at home and avoid fast food. Appetite ok. No fever or chills. Weight at home 275-280 pounds. Taking all medications. Now followed by [facility] for compression wraps.   ECHO 07/11/2015- 50-55%.  Grade I DD Echo 07/23/17: EF 55-60%, grade 1 DD  RHC 07/24/17: RA = 17 RV = 48/15 PA = 49/11 (30) PCW = 18 Fick cardiac output/index = 7.0/3.0 PVR = 0.5 WU Ao sat = 97% PA sat = 62%, 64% 1. Normal left-sided pressures 2. Mild PAH with evidence of RV strain likely due to OHS/OSA 3. Normal cardiac output  RHC 09/22/13: RA = 4  RV = 30/5/7  PA = 25/10 (16)  PCW = 7  Fick cardiac output/index = 6.3/2.7  PVR = 1.5 WU   FA sat = 93%  PA sat = 61%, 66%   SH: Former smoker quit 15 year ago. Does drink alcohol. She is disabled. Lives at home with her husband and disabled son - Husband is truck Geophysicist/field seismologist. Son has a brain injury after he attempted suicide  A few years ago. .    FH: Mom MI  Review of systems complete and found to be negative unless listed in HPI.   Past Medical History:  Diagnosis Date  . Acute MI ([place]) 1999; 2007  . Anemia    hx  . Anginal pain ([place])   . Anxiety   .  ARF (acute renal failure) ([place]) 06/2017  . Arthritis    "generalized" (03/15/2014)  . CAD (coronary artery disease)    MI in 2000 - MI  2007 - treated bare metal stent (no nuclear since then as 9/11)  . Carotid artery disease ([place])   . CHF (congestive heart failure) ([place])   . Chronic diastolic heart failure (HCC)    a) ECHO (08/2013) EF 55-60% and RV function nl b) RHC (08/2013) RA 4, RV 30/5/7, PA 25/10 (16), PCWP 7, Fick CO/CI 6.3/2.7, PVR 1.5 WU, PA 61 and 66%  . Daily headache    "~ every other day; since I fell in June" (03/15/2014)  . Depression   . Dyslipidemia   . Exertional shortness of breath   . HTN (hypertension)   . Hypothyroidism   . Obesity   . Osteoarthritis   . Peripheral neuropathy   . PONV (postoperative nausea and vomiting)   . RBBB (right bundle branch block)    Old  . Renal insufficiency    DENIES  . Stroke [facility])    mini strokes  . Syncope    likely due to low blood sugar  . Tachycardia    Sinus tachycardia  . Type II diabetes mellitus (  [name])   . Urinary incontinence   . Venous insufficiency     Current Outpatient Medications  Medication Sig Dispense Refill  . albuterol (PROVENTIL HFA;VENTOLIN HFA) 108 (90 BASE) MCG/ACT inhaler Inhale 2 puffs into the lungs every 6 (six) hours as needed for wheezing or shortness of breath. Reported on 10/30/2015    . aspirin EC 81 MG tablet Take 81 mg by mouth daily.    . carvedilol (COREG) 12.5 MG tablet TAKE 1 TABLET BY MOUTH TWICE A DAY WITH A MEAL 90 tablet 1  . cholecalciferol (VITAMIN D) 1000 units tablet Take 1,000 Units by mouth daily.    . clopidogrel (PLAVIX) 75 MG tablet TAKE 1 TABLET BY MOUTH DAILY WITH BREAKFAST. 90 tablet 2  . escitalopram (LEXAPRO) 20 MG tablet Take 1 tablet (20 mg total) by mouth daily. 90 tablet 2  . ferrous sulfate 325 (65 FE) MG tablet Take 325 mg by mouth daily with breakfast. Reported on 08/21/2015    . insulin NPH-regular Human (NOVOLIN 70/30) (70-30) 100 UNIT/ML injection Inject 50  Units into the skin daily with breakfast.    . insulin regular (NOVOLIN R,HUMULIN R) 100 units/mL injection 3 (three) times daily before meals.    . isosorbide dinitrate (ISORDIL) 10 MG tablet TAKE 1 TABLET BY MOUTH TWICE A DAY 60 tablet 6  . levothyroxine (SYNTHROID, LEVOTHROID) 50 MCG tablet Take 1 tablet (50 mcg total) by mouth daily before breakfast. 90 tablet 1  . losartan (COZAAR) 50 MG tablet TAKE 1 TABLET BY MOUTH EVERY DAY 90 tablet 1  . Magnesium Oxide 400 (240 Mg) MG TABS TAKE 1 TABLET BY MOUTH EVERY DAY (Patient taking differently: TAKE 240 mg TABLET BY MOUTH EVERY DAY) 90 tablet 3  . metolazone (ZAROXOLYN) 5 MG tablet Take 1 tablet (5 mg total) by mouth once a week. 12 tablet 0  . nitroGLYCERIN (NITROSTAT) 0.4 MG SL tablet PLACE 1 TABLET (0.4 MG TOTAL) UNDER THE TONGUE EVERY 5 (FIVE) MINUTES AS NEEDED FOR CHEST PAIN. 25 tablet 1  . ONETOUCH VERIO test strip     . pregabalin (LYRICA) 150 MG capsule Take 1 capsule (150 mg total) by mouth 2 (two) times daily. 60 capsule 2  . rosuvastatin (CRESTOR) 20 MG tablet Take 1 tablet (20 mg total) by mouth daily. 90 tablet 3  . torsemide (DEMADEX) 20 MG tablet TAKE 3 TABLETS BY MOUTH TWICE A DAY 180 tablet 3  . vitamin E (VITAMIN E) 1000 UNIT capsule Take 1,000 Units by mouth daily.     No current facility-administered medications for this encounter.     Vitals:   11/24/17 1411  BP: (!) 144/68  Pulse: 87  SpO2: 95%  Weight: 275 lb 3.2 oz (124.8 kg)   Wt Readings from Last 3 Encounters:  11/24/17 275 lb 3.2 oz (124.8 kg)  10/27/17 275 lb 12.8 oz (125.1 kg)  10/26/17 274 lb (124.3 kg)    PHYSICAL EXAM: General:  Appears chronicallu No resp difficulty HEENT: normal Neck: supple. JVP 6-7. Carotids 2+ bilat; no bruits. No lymphadenopathy or thryomegaly appreciated. Cor: PMI nondisplaced. Regular rate & rhythm. No rubs, gallops or murmurs. Lungs: clear Abdomen: soft, nontender, nondistended. No hepatosplenomegaly. No bruits or masses.  Good bowel sounds. Extremities: no cyanosis, clubbing, rash, edema Neuro: alert & orientedx3, cranial nerves grossly intact. moves all 4 extremities w/o difficulty. Affect pleasant  ASSESSMENT & PLAN: 1. Chronic Diastolic Heart Failure: NICM, Echo 06/2017: EF 55-60%, grade 1 DD - NYHA IIIb. Volume status ok today.  Continue torsemide 60 mg daily + metolazone 2.5 mg daily.   - Continue Coreg 12.5 mg BID - Continue losartan 50 mg daily  2.  Uncontrolled DM:  - Follows closely with Dr Bubba Camp  -  3. CAD, angina - Patient has a history of CAD with BMS in 2007.  - continue statin and aspirin.  -- She had a low risk stress test 01/2017 -No s/s ischemia   4. Obesity: - Body mass index is 45.8 kg/m.  - Discussed portion control.   6. HTN - Well controlled today  7. CKD Stage III - Followed by Dr Hollie Salk Recent BMET stable on 10/26/2017    8. Day time fatigue -She does not want a sleep study.  We discussed again.. She declined  9. RLE/LLE wounds.  -ABI ok for complression wraps.     Follow up in 4 weeks.Darrick Grinder, NP 2:16 PM

## 2017-11-27 ENCOUNTER — Other Ambulatory Visit: Payer: Self-pay | Admitting: *Deleted

## 2017-11-27 NOTE — Patient Outreach (Signed)
Telephone assessment. Pt did not answer but I was able to leave a message and requested a return call.  [name]. [name], MSN, [facility] Gerontological Nurse Practitioner [name] Care Management [phone]

## 2017-12-02 DIAGNOSIS — E039 Hypothyroidism, unspecified: Secondary | ICD-10-CM | POA: Diagnosis not present

## 2017-12-02 DIAGNOSIS — M1991 Primary osteoarthritis, unspecified site: Secondary | ICD-10-CM | POA: Diagnosis not present

## 2017-12-02 DIAGNOSIS — F419 Anxiety disorder, unspecified: Secondary | ICD-10-CM | POA: Diagnosis not present

## 2017-12-02 DIAGNOSIS — E1122 Type 2 diabetes mellitus with diabetic chronic kidney disease: Secondary | ICD-10-CM | POA: Diagnosis not present

## 2017-12-02 DIAGNOSIS — I130 Hypertensive heart and chronic kidney disease with heart failure and stage 1 through stage 4 chronic kidney disease, or unspecified chronic kidney disease: Secondary | ICD-10-CM | POA: Diagnosis not present

## 2017-12-02 DIAGNOSIS — I2510 Atherosclerotic heart disease of native coronary artery without angina pectoris: Secondary | ICD-10-CM | POA: Diagnosis not present

## 2017-12-02 DIAGNOSIS — R32 Unspecified urinary incontinence: Secondary | ICD-10-CM | POA: Diagnosis not present

## 2017-12-04 DIAGNOSIS — N183 Chronic kidney disease, stage 3 (moderate): Secondary | ICD-10-CM | POA: Diagnosis not present

## 2017-12-04 DIAGNOSIS — I5033 Acute on chronic diastolic (congestive) heart failure: Secondary | ICD-10-CM | POA: Diagnosis not present

## 2017-12-04 DIAGNOSIS — I87319 Chronic venous hypertension (idiopathic) with ulcer of unspecified lower extremity: Secondary | ICD-10-CM | POA: Diagnosis not present

## 2017-12-04 DIAGNOSIS — R269 Unspecified abnormalities of gait and mobility: Secondary | ICD-10-CM | POA: Diagnosis not present

## 2017-12-04 DIAGNOSIS — R0602 Shortness of breath: Secondary | ICD-10-CM | POA: Diagnosis not present

## 2017-12-10 ENCOUNTER — Other Ambulatory Visit (HOSPITAL_COMMUNITY): Payer: Self-pay | Admitting: Adult Health

## 2017-12-14 DIAGNOSIS — I130 Hypertensive heart and chronic kidney disease with heart failure and stage 1 through stage 4 chronic kidney disease, or unspecified chronic kidney disease: Secondary | ICD-10-CM | POA: Diagnosis not present

## 2017-12-14 DIAGNOSIS — R32 Unspecified urinary incontinence: Secondary | ICD-10-CM | POA: Diagnosis not present

## 2017-12-14 DIAGNOSIS — D638 Anemia in other chronic diseases classified elsewhere: Secondary | ICD-10-CM | POA: Diagnosis not present

## 2017-12-14 DIAGNOSIS — N184 Chronic kidney disease, stage 4 (severe): Secondary | ICD-10-CM | POA: Diagnosis not present

## 2017-12-14 DIAGNOSIS — E039 Hypothyroidism, unspecified: Secondary | ICD-10-CM | POA: Diagnosis not present

## 2017-12-14 DIAGNOSIS — E1122 Type 2 diabetes mellitus with diabetic chronic kidney disease: Secondary | ICD-10-CM | POA: Diagnosis not present

## 2017-12-14 DIAGNOSIS — Z7951 Long term (current) use of inhaled steroids: Secondary | ICD-10-CM | POA: Diagnosis not present

## 2017-12-14 DIAGNOSIS — I2510 Atherosclerotic heart disease of native coronary artery without angina pectoris: Secondary | ICD-10-CM | POA: Diagnosis not present

## 2017-12-14 DIAGNOSIS — M1991 Primary osteoarthritis, unspecified site: Secondary | ICD-10-CM | POA: Diagnosis not present

## 2017-12-14 DIAGNOSIS — I5032 Chronic diastolic (congestive) heart failure: Secondary | ICD-10-CM | POA: Diagnosis not present

## 2017-12-14 DIAGNOSIS — Z794 Long term (current) use of insulin: Secondary | ICD-10-CM | POA: Diagnosis not present

## 2017-12-14 DIAGNOSIS — R296 Repeated falls: Secondary | ICD-10-CM | POA: Diagnosis not present

## 2017-12-14 DIAGNOSIS — E1140 Type 2 diabetes mellitus with diabetic neuropathy, unspecified: Secondary | ICD-10-CM | POA: Diagnosis not present

## 2017-12-14 DIAGNOSIS — F419 Anxiety disorder, unspecified: Secondary | ICD-10-CM | POA: Diagnosis not present

## 2017-12-14 DIAGNOSIS — Z7902 Long term (current) use of antithrombotics/antiplatelets: Secondary | ICD-10-CM | POA: Diagnosis not present

## 2017-12-21 ENCOUNTER — Other Ambulatory Visit: Payer: Self-pay | Admitting: Licensed Clinical Social Worker

## 2017-12-21 ENCOUNTER — Encounter: Payer: Self-pay | Admitting: *Deleted

## 2017-12-21 NOTE — Patient Outreach (Signed)
Assessment:  CSW spoke via phone with client. CSW verified client identity. CSW received verbal permission from client for CSW to speak with client about client needs. CSW spoke with client about client care plan.. CSW encouraged client to continue to use relaxation techniques of choice in next 30 days to help client manage depression and anxiety symptoms of client.  CSW encouraged client to participate in activities to help her relax and manage stress or depression symptoms.  Client likes to watch old movies. She likes to talk  via phone with her daughter or other family members.  She has a pet she enjoys and likes to care for her pet and spend time with her pet.  Client said she uses a walker to help her ambjulate.  She has occasional problems with balance issues. She said she has been receiving in home care weekly for wound care with nurse from [facility] [facility].  Client has support from her son who resides at home with client.  Client's spouse works during the day; but client's spouse helps client when he is at home. Client receives [facility] support with Deloria Lair, Geriatric Nurse Practitioner.   CSW did collaborate with Deloria Lair via phone regarding client needs on 12/21/17.  CSW thanked client for phone call with CSW on 12/21/17.   Plan:  Client to continue to use relaxation  techniques of choice in next 30 days to help client manage depression and anxiety symptoms of client.   CSW to collaborate with Deloria Lair, Geriatric Nurse Practitioner, in monitoring needs of client.  CSW to call client in 4 weeks to assess client needs.  [name].Cammie Faulstich MSW, LCSW Licensed Clinical Social Worker [facility] Care Management [phone]

## 2017-12-22 ENCOUNTER — Ambulatory Visit (HOSPITAL_COMMUNITY)
Admission: RE | Admit: 2017-12-22 | Discharge: 2017-12-22 | Disposition: A | Discharge status: Home / Self Care | Payer: PPO | Source: Ambulatory Visit | Attending: Cardiology | Admitting: Cardiology

## 2017-12-22 VITALS — BP 138/82 | HR 71 | Wt 281.8 lb

## 2017-12-22 DIAGNOSIS — N183 Chronic kidney disease, stage 3 (moderate): Secondary | ICD-10-CM | POA: Diagnosis not present

## 2017-12-22 DIAGNOSIS — R5383 Other fatigue: Secondary | ICD-10-CM | POA: Insufficient documentation

## 2017-12-22 DIAGNOSIS — I2510 Atherosclerotic heart disease of native coronary artery without angina pectoris: Secondary | ICD-10-CM | POA: Diagnosis not present

## 2017-12-22 DIAGNOSIS — Z8673 Personal history of transient ischemic attack (TIA), and cerebral infarction without residual deficits: Secondary | ICD-10-CM | POA: Diagnosis not present

## 2017-12-22 DIAGNOSIS — M1990 Unspecified osteoarthritis, unspecified site: Secondary | ICD-10-CM | POA: Diagnosis not present

## 2017-12-22 DIAGNOSIS — I252 Old myocardial infarction: Secondary | ICD-10-CM | POA: Diagnosis not present

## 2017-12-22 DIAGNOSIS — I872 Venous insufficiency (chronic) (peripheral): Secondary | ICD-10-CM | POA: Insufficient documentation

## 2017-12-22 DIAGNOSIS — Z6841 Body Mass Index (BMI) 40.0 and over, adult: Secondary | ICD-10-CM | POA: Insufficient documentation

## 2017-12-22 DIAGNOSIS — I428 Other cardiomyopathies: Secondary | ICD-10-CM | POA: Insufficient documentation

## 2017-12-22 DIAGNOSIS — I25119 Atherosclerotic heart disease of native coronary artery with unspecified angina pectoris: Secondary | ICD-10-CM | POA: Insufficient documentation

## 2017-12-22 DIAGNOSIS — E1122 Type 2 diabetes mellitus with diabetic chronic kidney disease: Secondary | ICD-10-CM

## 2017-12-22 DIAGNOSIS — E785 Hyperlipidemia, unspecified: Secondary | ICD-10-CM | POA: Insufficient documentation

## 2017-12-22 DIAGNOSIS — Z79890 Hormone replacement therapy (postmenopausal): Secondary | ICD-10-CM | POA: Diagnosis not present

## 2017-12-22 DIAGNOSIS — I130 Hypertensive heart and chronic kidney disease with heart failure and stage 1 through stage 4 chronic kidney disease, or unspecified chronic kidney disease: Secondary | ICD-10-CM | POA: Diagnosis not present

## 2017-12-22 DIAGNOSIS — Z79899 Other long term (current) drug therapy: Secondary | ICD-10-CM | POA: Insufficient documentation

## 2017-12-22 DIAGNOSIS — N184 Chronic kidney disease, stage 4 (severe): Secondary | ICD-10-CM | POA: Diagnosis not present

## 2017-12-22 DIAGNOSIS — E1142 Type 2 diabetes mellitus with diabetic polyneuropathy: Secondary | ICD-10-CM | POA: Diagnosis not present

## 2017-12-22 DIAGNOSIS — Z794 Long term (current) use of insulin: Secondary | ICD-10-CM | POA: Diagnosis not present

## 2017-12-22 DIAGNOSIS — E669 Obesity, unspecified: Secondary | ICD-10-CM | POA: Insufficient documentation

## 2017-12-22 DIAGNOSIS — I4510 Unspecified right bundle-branch block: Secondary | ICD-10-CM | POA: Diagnosis not present

## 2017-12-22 DIAGNOSIS — Z87891 Personal history of nicotine dependence: Secondary | ICD-10-CM | POA: Diagnosis not present

## 2017-12-22 DIAGNOSIS — Z7982 Long term (current) use of aspirin: Secondary | ICD-10-CM | POA: Insufficient documentation

## 2017-12-22 DIAGNOSIS — I5032 Chronic diastolic (congestive) heart failure: Secondary | ICD-10-CM | POA: Insufficient documentation

## 2017-12-22 DIAGNOSIS — Z955 Presence of coronary angioplasty implant and graft: Secondary | ICD-10-CM | POA: Insufficient documentation

## 2017-12-22 DIAGNOSIS — F329 Major depressive disorder, single episode, unspecified: Secondary | ICD-10-CM | POA: Diagnosis not present

## 2017-12-22 DIAGNOSIS — F419 Anxiety disorder, unspecified: Secondary | ICD-10-CM | POA: Insufficient documentation

## 2017-12-22 DIAGNOSIS — E039 Hypothyroidism, unspecified: Secondary | ICD-10-CM | POA: Insufficient documentation

## 2017-12-22 DIAGNOSIS — I5030 Unspecified diastolic (congestive) heart failure: Secondary | ICD-10-CM | POA: Diagnosis present

## 2017-12-22 NOTE — Progress Notes (Signed)
fPatient ID: Susan [name], female   DOB: 1950-10-31, 67 y.o.   MRN: [id number]   PCP: Dr Scot Dock  Cardiologist: Dr Ron Parker  Endocrinologist: Dr Bubba Camp  Primary HF Cardiologist: Dr. Haroldine Laws  Nephrology: Dr Hollie Salk  [facility] Patient  HPI: Susan [name] is a 67 year old with a history of RBBB, DM, Hypothyroid, diastolic heart failure, CAD MI 2000/2007 BMS 2007, TIA, obesity.    Admitted June 1st through June 3rd, 2017 with altered mental status, hypothermia, and weakness. Diuretics initially held and later restarted.   Admitted 2/27-07/28/17 for AKI. Torsemide was held and restarted at 60 mg BID. She had RHC 07/24/17 that showed mild PAH with evidence of RV strain likely due to OHS/OSA. She was discharged to SNF.  Today she returns for HF follow up. Overall feeling fair. SOB with exertion. Denies PND/Orthopnea. Appetite ok. No fever or chills. Continues to eat fast food. She has not been weighing at home. Ongoing leg edema. Taking all medications.   ECHO 07/11/2015- 50-55%.  Grade I DD Echo 07/23/17: EF 55-60%, grade 1 DD  RHC 07/24/17: RA = 17 RV = 48/15 PA = 49/11 (30) PCW = 18 Fick cardiac output/index = 7.0/3.0 PVR = 0.5 WU Ao sat = 97% PA sat = 62%, 64% 1. Normal left-sided pressures 2. Mild PAH with evidence of RV strain likely due to OHS/OSA 3. Normal cardiac output  RHC 09/22/13: RA = 4  RV = 30/5/7  PA = 25/10 (16)  PCW = 7  Fick cardiac output/index = 6.3/2.7  PVR = 1.5 WU   FA sat = 93%  PA sat = 61%, 66%   ABI 11/04/2017 Right: The right toe-brachial index is normal. RT great toe pressure = 127 mmHg. Although ankle brachial indices are within normal limits (0.95-1.29), arterial Doppler waveforms at the ankle suggest some component of arterial occlusive disease. Left: The left toe-brachial index is abnormal. LT Great toe pressure = 115 mmHg. Although ankle brachial indices are within normal limits (0.95-1.29), arterial Doppler waveforms at the ankle suggest some component of  arterial occlusive disease.   SH: Former smoker quit 15 year ago. Does drink alcohol. She is disabled. Lives at home with her husband and disabled son - Husband is truck Geophysicist/field seismologist. Son has a brain injury after he attempted suicide  A few years ago. .    FH: Mom MI  Review of systems complete and found to be negative unless listed in HPI.   Past Medical History:  Diagnosis Date  . Acute MI ([place]) 1999; 2007  . Anemia    hx  . Anginal pain ([place])   . Anxiety   . ARF (acute renal failure) ([place]) 06/2017  . Arthritis    "generalized" (03/15/2014)  . CAD (coronary artery disease)    MI in 2000 - MI  2007 - treated bare metal stent (no nuclear since then as 9/11)  . Carotid artery disease ([place])   . CHF (congestive heart failure) ([place])   . Chronic diastolic heart failure (HCC)    a) ECHO (08/2013) EF 55-60% and RV function nl b) RHC (08/2013) RA 4, RV 30/5/7, PA 25/10 (16), PCWP 7, Fick CO/CI 6.3/2.7, PVR 1.5 WU, PA 61 and 66%  . Daily headache    "~ every other day; since I fell in June" (03/15/2014)  . Depression   . Dyslipidemia   . Exertional shortness of breath   . HTN (hypertension)   . Hypothyroidism   . Obesity   . Osteoarthritis   .  Peripheral neuropathy   . PONV (postoperative nausea and vomiting)   . RBBB (right bundle branch block)    Old  . Renal insufficiency    DENIES  . Stroke [facility])    mini strokes  . Syncope    likely due to low blood sugar  . Tachycardia    Sinus tachycardia  . Type II diabetes mellitus ([place])   . Urinary incontinence   . Venous insufficiency     Current Outpatient Medications  Medication Sig Dispense Refill  . albuterol (PROVENTIL HFA;VENTOLIN HFA) 108 (90 BASE) MCG/ACT inhaler Inhale 2 puffs into the lungs every 6 (six) hours as needed for wheezing or shortness of breath. Reported on 10/30/2015    . aspirin EC 81 MG tablet Take 81 mg by mouth daily.    . carvedilol (COREG) 12.5 MG tablet TAKE 1 TABLET BY MOUTH TWICE A DAY WITH A MEAL 90 tablet  1  . cholecalciferol (VITAMIN D) 1000 units tablet Take 1,000 Units by mouth daily.    . clopidogrel (PLAVIX) 75 MG tablet TAKE 1 TABLET BY MOUTH DAILY WITH BREAKFAST. 90 tablet 2  . escitalopram (LEXAPRO) 20 MG tablet Take 1 tablet (20 mg total) by mouth daily. 90 tablet 2  . ferrous sulfate 325 (65 FE) MG tablet Take 325 mg by mouth daily with breakfast. Reported on 08/21/2015    . insulin NPH-regular Human (NOVOLIN 70/30) (70-30) 100 UNIT/ML injection Inject 50 Units into the skin daily with breakfast.    . insulin regular (NOVOLIN R,HUMULIN R) 100 units/mL injection 3 (three) times daily before meals.    . isosorbide dinitrate (ISORDIL) 10 MG tablet TAKE 1 TABLET BY MOUTH TWICE A DAY 60 tablet 6  . levothyroxine (SYNTHROID, LEVOTHROID) 50 MCG tablet Take 1 tablet (50 mcg total) by mouth daily before breakfast. 90 tablet 1  . losartan (COZAAR) 50 MG tablet TAKE 1 TABLET BY MOUTH EVERY DAY 90 tablet 1  . Magnesium Oxide 400 (240 Mg) MG TABS TAKE 1 TABLET BY MOUTH EVERY DAY (Patient taking differently: TAKE 240 mg TABLET BY MOUTH EVERY DAY) 90 tablet 3  . metolazone (ZAROXOLYN) 5 MG tablet Take 1 tablet (5 mg total) by mouth once a week. 12 tablet 0  . nitroGLYCERIN (NITROSTAT) 0.4 MG SL tablet PLACE 1 TABLET (0.4 MG TOTAL) UNDER THE TONGUE EVERY 5 (FIVE) MINUTES AS NEEDED FOR CHEST PAIN. 25 tablet 1  . ONETOUCH VERIO test strip     . pregabalin (LYRICA) 150 MG capsule Take 1 capsule (150 mg total) by mouth 2 (two) times daily. 60 capsule 2  . rosuvastatin (CRESTOR) 20 MG tablet Take 1 tablet (20 mg total) by mouth daily. 90 tablet 3  . torsemide (DEMADEX) 20 MG tablet TAKE 3 TABLETS BY MOUTH TWICE A DAY 180 tablet 3  . torsemide (DEMADEX) 20 MG tablet Take 3 tablets (60 mg total) by mouth 2 (two) times daily. 540 tablet 3  . vitamin E (VITAMIN E) 1000 UNIT capsule Take 1,000 Units by mouth daily.     No current facility-administered medications for this encounter.     Vitals:   12/22/17 1420   BP: 138/82  Pulse: 71  SpO2: 96%  Weight: 281 lb 12.8 oz (127.8 kg)   Wt Readings from Last 3 Encounters:  12/22/17 281 lb 12.8 oz (127.8 kg)  11/24/17 275 lb 3.2 oz (124.8 kg)  10/27/17 275 lb 12.8 oz (125.1 kg)    PHYSICAL EXAM: General:  Walked slowly in the clinic.  No resp difficulty HEENT: normal Neck: supple. no JVD. Carotids 2+ bilat; no bruits. No lymphadenopathy or thryomegaly appreciated. Cor: PMI nondisplaced. Regular rate & rhythm. No rubs, gallops or murmurs. Lungs: clear Abdomen: soft, nontender, nondistended. No hepatosplenomegaly. No bruits or masses. Good bowel sounds. Extremities: no cyanosis, clubbing, rash, R and LLE 1+ edema Neuro: alert & orientedx3, cranial nerves grossly intact. moves all 4 extremities w/o difficulty. Affect pleasant  ASSESSMENT & PLAN: 1. Chronic Diastolic Heart Failure: NICM, Echo 06/2017: EF 55-60%, grade 1 DD - NYHA IIIb.  - Volume status elevated. Increase torsemide to 60/60 twice a day. Instructed to take an extra 20 mg for increased leg edema. Continue metolazone once weekly.   - Continue Coreg 12.5 mg BID - Continue losartan 50 mg daily - Discussed fluid restrictions and low salt food choices.   2.  Uncontrolled DM:  - Follows closely with Dr Bubba Camp  -Hgb 9.3 .   3. CAD, angina -Continue statin and asa - Patient has a history of CAD with BMS in 2007.   -- She had a low risk stress test 01/2017  4. Obesity: - Body mass index is 46.89 kg/m.  - Discussed portion control.   6. HTN Controlled.   7. CKD Stage III - Followed by Dr Hollie Salk  8. Day time fatigue -She does not want a sleep study.   9. RLE/LLE wounds.  ABI with component mild arterial disease.   Follow up in 6 weeks. Stressed the importance of low salt food choices.   Darrick Grinder, NP 2:54 PM

## 2017-12-22 NOTE — Patient Instructions (Addendum)
CONTINUE to take extra 20 mg of Torsemide as needed for swelling/SOB   Your physician recommends that you schedule a follow-up appointment in: 6 weeks with Darrick Grinder, NP

## 2017-12-30 ENCOUNTER — Other Ambulatory Visit: Payer: Self-pay | Admitting: Family Medicine

## 2018-01-05 DIAGNOSIS — I739 Peripheral vascular disease, unspecified: Secondary | ICD-10-CM | POA: Diagnosis not present

## 2018-01-05 DIAGNOSIS — L603 Nail dystrophy: Secondary | ICD-10-CM | POA: Diagnosis not present

## 2018-01-05 DIAGNOSIS — E1051 Type 1 diabetes mellitus with diabetic peripheral angiopathy without gangrene: Secondary | ICD-10-CM | POA: Diagnosis not present

## 2018-01-05 DIAGNOSIS — L84 Corns and callosities: Secondary | ICD-10-CM | POA: Diagnosis not present

## 2018-01-13 ENCOUNTER — Ambulatory Visit (INDEPENDENT_AMBULATORY_CARE_PROVIDER_SITE_OTHER): Payer: PPO | Admitting: Family Medicine

## 2018-01-13 ENCOUNTER — Encounter: Payer: Self-pay | Admitting: Family Medicine

## 2018-01-13 ENCOUNTER — Other Ambulatory Visit: Payer: Self-pay

## 2018-01-13 VITALS — BP 138/78 | HR 86 | Temp 97.9°F | Resp 16 | Ht 65.0 in | Wt 275.0 lb

## 2018-01-13 DIAGNOSIS — J0110 Acute frontal sinusitis, unspecified: Secondary | ICD-10-CM | POA: Diagnosis not present

## 2018-01-13 DIAGNOSIS — R197 Diarrhea, unspecified: Secondary | ICD-10-CM | POA: Diagnosis not present

## 2018-01-13 DIAGNOSIS — E1122 Type 2 diabetes mellitus with diabetic chronic kidney disease: Secondary | ICD-10-CM | POA: Diagnosis not present

## 2018-01-13 DIAGNOSIS — J029 Acute pharyngitis, unspecified: Secondary | ICD-10-CM | POA: Diagnosis not present

## 2018-01-13 DIAGNOSIS — N184 Chronic kidney disease, stage 4 (severe): Secondary | ICD-10-CM | POA: Diagnosis not present

## 2018-01-13 LAB — CBC WITH DIFFERENTIAL/PLATELET
Basophils Absolute: 40 cells/uL (ref 0–200)
Basophils Relative: 0.6 %
Eosinophils Absolute: 218 cells/uL (ref 15–500)
Eosinophils Relative: 3.3 %
HCT: 33.8 % — ABNORMAL LOW (ref 35.0–45.0)
Hemoglobin: 10.5 g/dL — ABNORMAL LOW (ref 11.7–15.5)
Lymphs Abs: 931 cells/uL (ref 850–3900)
MCH: 24.2 pg — ABNORMAL LOW (ref 27.0–33.0)
MCHC: 31.1 g/dL — ABNORMAL LOW (ref 32.0–36.0)
MCV: 78.1 fL — ABNORMAL LOW (ref 80.0–100.0)
MPV: 11.7 fL (ref 7.5–12.5)
Monocytes Relative: 7.4 %
Neutro Abs: 4924 cells/uL (ref 1500–7800)
Neutrophils Relative %: 74.6 %
Platelets: 234 10*3/uL (ref 140–400)
RBC: 4.33 10*6/uL (ref 3.80–5.10)
RDW: 15.4 % — ABNORMAL HIGH (ref 11.0–15.0)
Total Lymphocyte: 14.1 %
WBC mixed population: 488 cells/uL (ref 200–950)
WBC: 6.6 10*3/uL (ref 3.8–10.8)

## 2018-01-13 LAB — BASIC METABOLIC PANEL
BUN/Creatinine Ratio: 29 (calc) — ABNORMAL HIGH (ref 6–22)
BUN: 54 mg/dL — ABNORMAL HIGH (ref 7–25)
CO2: 25 mmol/L (ref 20–32)
Calcium: 8.8 mg/dL (ref 8.6–10.4)
Chloride: 103 mmol/L (ref 98–110)
Creat: 1.89 mg/dL — ABNORMAL HIGH (ref 0.50–0.99)
Glucose, Bld: 159 mg/dL — ABNORMAL HIGH (ref 65–99)
Potassium: 5.6 mmol/L — ABNORMAL HIGH (ref 3.5–5.3)
Sodium: 136 mmol/L (ref 135–146)

## 2018-01-13 MED ORDER — ONDANSETRON HCL 4 MG PO TABS: 4.0000 mg | ORAL_TABLET | Freq: Three times a day (TID) | ORAL | 0 refills | Status: DC | PRN

## 2018-01-13 MED ORDER — AMOXICILLIN 875 MG PO TABS: 875.0000 mg | ORAL_TABLET | Freq: Two times a day (BID) | ORAL | 0 refills | Status: DC

## 2018-01-13 NOTE — Progress Notes (Signed)
   Subjective:    Patient ID: Susan [name], female    DOB: 12-28-1950, 67 y.o.   MRN: [id number]  Patient presents for Illness (x3 weeks- sore throat, N/V, fever/ chills, HA, nasal drainage ith blood tinged mucus)   Has been sick for past 3 weeks. Has had dry heaves sinus headache, mild sore throat, subjective fever, with chills. Has drainage with blood discharge mixed in with blowing. Not using any nasal sprays. Cough is mostly dry.  Has diarrhea for past couple of weeks 2-3 weeks a day after eating has bowel movement   + son now sick She has been taking mucinex tablet and liquid, using albuterol inhaler   Now on insulin Pump by Dr. [name], she had a low sugar yesterday, No longer on long activig insulin   Peripheral edema/CHF-have gone down significantly she is taking her diuretics as prescribed she is no longer requiring the [place] boots.  She was also seen recently by cardiology.     Review Of Systems:  GEN- denies fatigue, +fever, weight loss,weakness, recent illness HEENT- denies eye drainage, change in vision, +nasal discharge, CVS- denies chest pain, palpitations RESP- denies SOB, +cough, wheeze ABD- denies N/V, +change in stools, abd pain GU- denies dysuria, hematuria, dribbling, incontinence MSK- denies joint pain, muscle aches, injury Neuro- + headache, dizziness, syncope, seizure activity       Objective:    BP 138/78   Pulse 86   Temp 97.9 F (36.6 C) (Oral)   Resp 16   Ht 5\' 5"  (1.651 m)   Wt 275 lb (124.7 kg)   SpO2 100%   BMI 45.76 kg/m  GEN- NAD, alert and oriented x3, non toxic appearing  HEENT- PERRL, EOMI, non injected sclera, pink conjunctiva, MMM, oropharynx mild injection, TM clear bilat no effusion,  + FRONTAL sinus tenderness, inflammed turbinates,  Nasal drainage  Neck- Supple, no LAD CVS- RRR, no murmur RESP-CTAB ABD-NABS,soft, NT, ND, large pannus EXT- trace edema, venous stasis changes Pulses- Radial 2+         Assessment & Plan:       Problem List Items Addressed This Visit      Unprioritized   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 mellitus ([place])   Relevant Orders   Basic metabolic panel    Other Visit Diagnoses    Acute frontal sinusitis, recurrence not specified    -  Primary   Check STAT labs due to her multiple comorbidites and easily decompensates, given amoxicillin, use nasal saline, Mucinex or Coricidan, tylenol for HA, hydrate   Relevant Medications   amoxicillin (AMOXIL) 875 MG tablet   Other Relevant Orders   STREP GROUP A AG, W/REFLEX TO CULT   CBC with Differential/Platelet   Diarrhea, unspecified type       Only with eating, abd exam benign   Relevant Orders   CBC with Differential/Platelet   Basic metabolic panel      Note: This dictation was prepared with Dragon dictation along with smaller phrase technology. Any transcriptional errors that result from this process are unintentional.

## 2018-01-13 NOTE — Patient Instructions (Addendum)
Take the antibiotics Use the salt water spray or ocean water spray for your nose Use inhaler for any wheezing or tightness  Okay to use Mucinex DM or Coricidan Tylenol for headache  F/U 4 months

## 2018-01-14 ENCOUNTER — Other Ambulatory Visit: Payer: Self-pay | Admitting: *Deleted

## 2018-01-14 ENCOUNTER — Telehealth: Payer: Self-pay | Admitting: *Deleted

## 2018-01-14 DIAGNOSIS — E875 Hyperkalemia: Secondary | ICD-10-CM

## 2018-01-14 NOTE — Patient Outreach (Signed)
Telephone assessment: Called and got pt's answering machine. I left a message and requested a return call. If I do not hear from her I will call her again on Monday 01/18/18.  Susan [name]. [name], MSN, [facility] Gerontological Nurse Practitioner [facility] Care Management [phone]

## 2018-01-14 NOTE — Telephone Encounter (Signed)
LATE DOCUMENTATION FOR 01/13/2018 @ 5PM:  Received fax from [facility] with STAT lab results. Of note, interface experiencing technical difficulties and could not cross to Epic.   MD reviewed and recommendations are as follows: Overall, labs look good.  WBC 6.6~ WNL Hgb 4.33~ stable BUN 54/ Creatinine 1.89~ near baseline Potassium noted elevated at 5.6~ no potassium supplement noted on medication list~ verify with patient if supplement is being used. If so, D/C potassium. Repeat BMET on Friday, 01/15/2018.  Call placed to patient and patient made aware. States that she is receiving Klor Con 38mEq from pharmacy. Advised to D/C potassium and repeat labs on Friday. Verbalized understanding.   Future lab orders placed.

## 2018-01-15 ENCOUNTER — Other Ambulatory Visit: Payer: PPO

## 2018-01-15 DIAGNOSIS — E875 Hyperkalemia: Secondary | ICD-10-CM | POA: Diagnosis not present

## 2018-01-15 LAB — CULTURE, GROUP A STREP
MICRO NUMBER:: 90996498
SPECIMEN QUALITY:: ADEQUATE

## 2018-01-15 LAB — STREP GROUP A AG, W/REFLEX TO CULT: Streptococcus, Group A Screen (Direct): NOT DETECTED

## 2018-01-16 LAB — BASIC METABOLIC PANEL
BUN/Creatinine Ratio: 30 (calc) — ABNORMAL HIGH (ref 6–22)
BUN: 55 mg/dL — ABNORMAL HIGH (ref 7–25)
CO2: 24 mmol/L (ref 20–32)
Calcium: 8.8 mg/dL (ref 8.6–10.4)
Chloride: 104 mmol/L (ref 98–110)
Creat: 1.83 mg/dL — ABNORMAL HIGH (ref 0.50–0.99)
Glucose, Bld: 408 mg/dL — ABNORMAL HIGH (ref 65–99)
Potassium: 5.6 mmol/L — ABNORMAL HIGH (ref 3.5–5.3)
Sodium: 136 mmol/L (ref 135–146)

## 2018-01-18 ENCOUNTER — Other Ambulatory Visit: Payer: Self-pay | Admitting: *Deleted

## 2018-01-18 DIAGNOSIS — E875 Hyperkalemia: Secondary | ICD-10-CM

## 2018-01-18 MED ORDER — SODIUM POLYSTYRENE SULFONATE 15 GM/60ML PO SUSP: 30.0000 g | Freq: Once | ORAL | 0 refills | Status: AC

## 2018-01-18 NOTE — Patient Outreach (Signed)
Telephone outreach unsuccessful. I left a message and requested a return call. Pt did call me back last Thursday and I was not able to call her back within our business hours.  [name]. [name], MSN, [facility] Gerontological Nurse Practitioner [facility] Care Management [phone]

## 2018-01-22 ENCOUNTER — Other Ambulatory Visit: Payer: Self-pay | Admitting: Licensed Clinical Social Worker

## 2018-01-22 ENCOUNTER — Other Ambulatory Visit: Payer: PPO

## 2018-01-22 DIAGNOSIS — E875 Hyperkalemia: Secondary | ICD-10-CM | POA: Diagnosis not present

## 2018-01-22 NOTE — Patient Outreach (Signed)
Assessment:  CSW spoke via phone with client. CSW verified client identity. CSW received verbal permission from client for CSW to speak with client about client needs. CSW spoke with client about client care plan. CSW encouraged client to continue to use relaxation techniques of choice in next 30 days to help client in managing depression or anxiety symptoms experienced. Client likes to watch old TV movies, likes to talk with her daughter or other family members via phone, likes to receive visits at her home with family members or friends and likes to spend time caring for her pet dog. Client has been receiving in home nursing care as scheduled with [facility]. Client receives [facility] nursing support with Deloria Lair , Nurse Practitioner. Client sees Dr. [name] as primary care doctor. Client is attending scheduled client medical appointments.  Client has [facility] phone number to call to talk with CSW regarding social work needs of client.  Client has some support from her son and some support from her spouse.    Plan:  Client to continue to use relaxation techniques of choice in the next 30 days to help client in managing depression or anxiety symptoms experienced.    CSW to collaborate with Deloria Lair, Nurse Practitioner, in monitoring needs of client.   CSW to call client in 4 weeks to assess client needs.  [name].Idali Lafever MSW, LCSW Licensed Clinical Social Worker [facility] Care Management [phone]

## 2018-01-23 LAB — BASIC METABOLIC PANEL
BUN/Creatinine Ratio: 28 (calc) — ABNORMAL HIGH (ref 6–22)
BUN: 46 mg/dL — ABNORMAL HIGH (ref 7–25)
CO2: 25 mmol/L (ref 20–32)
Calcium: 9 mg/dL (ref 8.6–10.4)
Chloride: 108 mmol/L (ref 98–110)
Creat: 1.63 mg/dL — ABNORMAL HIGH (ref 0.50–0.99)
Glucose, Bld: 240 mg/dL — ABNORMAL HIGH (ref 65–99)
Potassium: 5.8 mmol/L — ABNORMAL HIGH (ref 3.5–5.3)
Sodium: 138 mmol/L (ref 135–146)

## 2018-01-23 LAB — EXTRA LAV TOP TUBE

## 2018-01-24 ENCOUNTER — Telehealth: Payer: Self-pay | Admitting: Family Medicine

## 2018-01-24 DIAGNOSIS — E875 Hyperkalemia: Secondary | ICD-10-CM

## 2018-01-24 MED ORDER — SODIUM POLYSTYRENE SULFONATE 15 GM/60ML PO SUSP: 30.0000 g | Freq: Once | ORAL | 0 refills | Status: AC

## 2018-01-24 NOTE — Telephone Encounter (Signed)
Called pt home number twice and cellphone Left message on cell phone Potassium is going up now 5.8 She was suppose to take the Kayexalate when it was 5.6, her renal function has been stable  She denied taking any Potassium supplements  Unsure if she took the dose of med and when  On VM told her to go to ER if any chest pain or palpitations Will try again in the morning Went ahead and sent another dose to pharmacy   Need lab repeated 48 hours after dose

## 2018-01-26 ENCOUNTER — Encounter: Payer: Self-pay | Admitting: *Deleted

## 2018-01-26 ENCOUNTER — Other Ambulatory Visit: Payer: Self-pay | Admitting: *Deleted

## 2018-01-26 ENCOUNTER — Ambulatory Visit: Payer: PPO | Admitting: Family Medicine

## 2018-01-26 NOTE — Patient Outreach (Addendum)
Telephone assessment for case closure. Susan [name] has been difficult to reach but I was able to reach her today. She report she has recently been to Dr. Dorian Heckle office and had labs done. The office called back and told her her potassium is too high. She is being ordered some Kayexolate to take and then have her labs checked again.  She reports she has a new glucose monitoring devise she is using. Her blood glucose today was 268. She acknowledges difficulty in following low salt, low fat and low sugar/carb diet.  She reports she is getting adequate help from son and husband at home.  I have advised her that I will now close her case as she was able to meet her goals. I have encouraged her to continue maximal efforts to control her chronic disease processes, she has the knowledge, she just needs to commit to making the necessary changes.  I will be available to her in the future should she require coaching or other [facility] service.  I will notify Susan Nan, LCSW that I am closing her case for my part.  [name] CM Care Plan Problem One     Most Recent Value  Care Plan Problem One  Knowledge Deficits related to long term management of DMII  Role Documenting the Problem One  Care Management Coordinator  Care Plan for Problem One  Active  [name] Long Term Goal   Over the next 31 days, patient will verbalize understanding of basic plan of care for management of DM  [name] Long Term Goal Start Date  08/25/17  [facility] Long Term Goal Met Date  09/30/17  [facility] CM Short Term Goal #1   Pt will attend her endocrinology appt on 11/15/17.  [name] CM Short Term Goal #1 Start Date  10/29/17  [name] CM Short Term Goal #1 Met Date  01/26/18  [name] CM Short Term Goal #3  Over the next 30 days, patient will contact endocrinology office to schedule appointment  [facility] CM Short Term Goal #3 Start Date  08/25/17  [facility] CM Short Term Goal #3 Met Date  10/29/17       [name]. [name], MSN, [facility] Gerontological Nurse Practitioner [facility]  Care Management [phone]

## 2018-01-28 ENCOUNTER — Other Ambulatory Visit: Payer: PPO

## 2018-01-28 DIAGNOSIS — E875 Hyperkalemia: Secondary | ICD-10-CM | POA: Diagnosis not present

## 2018-01-29 LAB — BASIC METABOLIC PANEL
BUN/Creatinine Ratio: 20 (calc) (ref 6–22)
BUN: 41 mg/dL — ABNORMAL HIGH (ref 7–25)
CO2: 23 mmol/L (ref 20–32)
Calcium: 8.2 mg/dL — ABNORMAL LOW (ref 8.6–10.4)
Chloride: 101 mmol/L (ref 98–110)
Creat: 2.1 mg/dL — ABNORMAL HIGH (ref 0.50–0.99)
Glucose, Bld: 407 mg/dL — ABNORMAL HIGH (ref 65–99)
Potassium: 4.6 mmol/L (ref 3.5–5.3)
Sodium: 133 mmol/L — ABNORMAL LOW (ref 135–146)

## 2018-01-31 ENCOUNTER — Other Ambulatory Visit: Payer: Self-pay | Admitting: Family Medicine

## 2018-02-01 DIAGNOSIS — Z23 Encounter for immunization: Secondary | ICD-10-CM | POA: Diagnosis not present

## 2018-02-01 DIAGNOSIS — E1165 Type 2 diabetes mellitus with hyperglycemia: Secondary | ICD-10-CM | POA: Diagnosis not present

## 2018-02-01 DIAGNOSIS — L0390 Cellulitis, unspecified: Secondary | ICD-10-CM | POA: Diagnosis not present

## 2018-02-02 ENCOUNTER — Ambulatory Visit: Payer: PPO | Admitting: Family Medicine

## 2018-02-03 ENCOUNTER — Ambulatory Visit (HOSPITAL_COMMUNITY)
Admission: RE | Admit: 2018-02-03 | Discharge: 2018-02-03 | Disposition: A | Discharge status: Home / Self Care | Payer: PPO | Source: Ambulatory Visit | Attending: Cardiology | Admitting: Cardiology

## 2018-02-03 ENCOUNTER — Encounter (HOSPITAL_COMMUNITY): Payer: Self-pay

## 2018-02-03 VITALS — BP 148/84 | HR 79 | Wt 283.8 lb

## 2018-02-03 DIAGNOSIS — N184 Chronic kidney disease, stage 4 (severe): Secondary | ICD-10-CM | POA: Diagnosis not present

## 2018-02-03 DIAGNOSIS — Z8673 Personal history of transient ischemic attack (TIA), and cerebral infarction without residual deficits: Secondary | ICD-10-CM | POA: Insufficient documentation

## 2018-02-03 DIAGNOSIS — I2510 Atherosclerotic heart disease of native coronary artery without angina pectoris: Secondary | ICD-10-CM | POA: Diagnosis not present

## 2018-02-03 DIAGNOSIS — R5383 Other fatigue: Secondary | ICD-10-CM | POA: Insufficient documentation

## 2018-02-03 DIAGNOSIS — L03115 Cellulitis of right lower limb: Secondary | ICD-10-CM | POA: Insufficient documentation

## 2018-02-03 DIAGNOSIS — E1122 Type 2 diabetes mellitus with diabetic chronic kidney disease: Secondary | ICD-10-CM

## 2018-02-03 DIAGNOSIS — I130 Hypertensive heart and chronic kidney disease with heart failure and stage 1 through stage 4 chronic kidney disease, or unspecified chronic kidney disease: Secondary | ICD-10-CM | POA: Insufficient documentation

## 2018-02-03 DIAGNOSIS — Z6841 Body Mass Index (BMI) 40.0 and over, adult: Secondary | ICD-10-CM | POA: Diagnosis not present

## 2018-02-03 DIAGNOSIS — I5032 Chronic diastolic (congestive) heart failure: Secondary | ICD-10-CM | POA: Diagnosis not present

## 2018-02-03 DIAGNOSIS — N183 Chronic kidney disease, stage 3 (moderate): Secondary | ICD-10-CM | POA: Insufficient documentation

## 2018-02-03 DIAGNOSIS — E039 Hypothyroidism, unspecified: Secondary | ICD-10-CM | POA: Insufficient documentation

## 2018-02-03 DIAGNOSIS — Z79899 Other long term (current) drug therapy: Secondary | ICD-10-CM | POA: Insufficient documentation

## 2018-02-03 DIAGNOSIS — Z7982 Long term (current) use of aspirin: Secondary | ICD-10-CM | POA: Diagnosis not present

## 2018-02-03 DIAGNOSIS — I429 Cardiomyopathy, unspecified: Secondary | ICD-10-CM | POA: Diagnosis not present

## 2018-02-03 DIAGNOSIS — E669 Obesity, unspecified: Secondary | ICD-10-CM | POA: Insufficient documentation

## 2018-02-03 DIAGNOSIS — E785 Hyperlipidemia, unspecified: Secondary | ICD-10-CM | POA: Insufficient documentation

## 2018-02-03 DIAGNOSIS — Z79890 Hormone replacement therapy (postmenopausal): Secondary | ICD-10-CM | POA: Diagnosis not present

## 2018-02-03 DIAGNOSIS — Z794 Long term (current) use of insulin: Secondary | ICD-10-CM | POA: Diagnosis not present

## 2018-02-03 DIAGNOSIS — Z87891 Personal history of nicotine dependence: Secondary | ICD-10-CM | POA: Diagnosis not present

## 2018-02-03 NOTE — Patient Instructions (Signed)
Follow up in 6 weeks  Do the following things EVERYDAY: 1) Weigh yourself in the morning before breakfast. Write it down and keep it in a log. 2) Take your medicines as prescribed 3) Eat low salt foods-Limit salt (sodium) to 2000 mg per day.  4) Stay as active as you can everyday 5) Limit all fluids for the day to less than 2 liters 

## 2018-02-03 NOTE — Progress Notes (Signed)
fPatient ID: Susan [name], female   DOB: [birth year], 67 y.o.   MRN: [id number]   PCP: Dr Scot Dock  Cardiologist: Dr Ron Parker  Endocrinologist: Dr Bubba Camp  Primary HF Cardiologist: Dr. Haroldine Laws  Nephrology: Dr Hollie Salk  [facility] Patient  HPI: Susan [name] is a 67 year old with a history of RBBB, DM, Hypothyroid, diastolic heart failure, CAD MI 2000/2007 BMS 2007, TIA, obesity.    Admitted June 1st through June 3rd, 2017 with altered mental status, hypothermia, and weakness. Diuretics initially held and later restarted.   Admitted 2/27-07/28/17 for AKI. Torsemide was held and restarted at 60 mg BID. She had RHC 07/24/17 that showed mild PAH with evidence of RV strain likely due to OHS/OSA. She was discharged to SNF.  Today she returns for HF follow up. Overall feeling fair. Complaining of r toe pain. She was recently started on antibiotics per PCP for cellulitis.  SOB with exertion. Denies PND/Orthopnea. Appetite ok. No fever or chills. Continues to eat fast food. She has not been weighing at home. Ongoing leg edema. Taking all medications.  ECHO 07/11/2015- 50-55%.  Grade I DD Echo 07/23/17: EF 55-60%, grade 1 DD  RHC 07/24/17: RA = 17 RV = 48/15 PA = 49/11 (30) PCW = 18 Fick cardiac output/index = 7.0/3.0 PVR = 0.5 WU Ao sat = 97% PA sat = 62%, 64% 1. Normal left-sided pressures 2. Mild PAH with evidence of RV strain likely due to OHS/OSA 3. Normal cardiac output  RHC 09/22/13: RA = 4  RV = 30/5/7  PA = 25/10 (16)  PCW = 7  Fick cardiac output/index = 6.3/2.7  PVR = 1.5 WU   FA sat = 93%  PA sat = 61%, 66%   ABI 11/04/2017 Right: The right toe-brachial index is normal. RT great toe pressure = 127 mmHg. Although ankle brachial indices are within normal limits (0.95-1.29), arterial Doppler waveforms at the ankle suggest some component of arterial occlusive disease. Left: The left toe-brachial index is abnormal. LT Great toe pressure = 115 mmHg. Although ankle brachial indices are within normal  limits (0.95-1.29), arterial Doppler waveforms at the ankle suggest some component of arterial occlusive disease.  SH: Former smoker quit 15 year ago. Does drink alcohol. She is disabled. Lives at home with her husband and disabled son - Husband is truck Geophysicist/field seismologist. Son has a brain injury after he attempted suicide  A few years ago. .    FH: Mom MI  Review of systems complete and found to be negative unless listed in HPI.   Past Medical History:  Diagnosis Date  . Acute MI ([place]) 1999; 2007  . Anemia    hx  . Anginal pain ([place])   . Anxiety   . ARF (acute renal failure) ([place]) 06/2017  . Arthritis    "generalized" (03/15/2014)  . CAD (coronary artery disease)    MI in 2000 - MI  2007 - treated bare metal stent (no nuclear since then as 9/11)  . Carotid artery disease ([place])   . CHF (congestive heart failure) ([place])   . Chronic diastolic heart failure (HCC)    a) ECHO (08/2013) EF 55-60% and RV function nl b) RHC (08/2013) RA 4, RV 30/5/7, PA 25/10 (16), PCWP 7, Fick CO/CI 6.3/2.7, PVR 1.5 WU, PA 61 and 66%  . Daily headache    "~ every other day; since I fell in June" (03/15/2014)  . Depression   . Dyslipidemia   . Exertional shortness of breath   . HTN (  hypertension)   . Hypothyroidism   . Obesity   . Osteoarthritis   . Peripheral neuropathy   . PONV (postoperative nausea and vomiting)   . RBBB (right bundle branch block)    Old  . Renal insufficiency    DENIES  . Stroke [facility])    mini strokes  . Syncope    likely due to low blood sugar  . Tachycardia    Sinus tachycardia  . Type II diabetes mellitus ([place])   . Urinary incontinence   . Venous insufficiency     Current Outpatient Medications  Medication Sig Dispense Refill  . albuterol (PROVENTIL HFA;VENTOLIN HFA) 108 (90 BASE) MCG/ACT inhaler Inhale 2 puffs into the lungs every 6 (six) hours as needed for wheezing or shortness of breath. Reported on 10/30/2015    . aspirin EC 81 MG tablet Take 81 mg by mouth daily.    .  carvedilol (COREG) 12.5 MG tablet TAKE 1 TABLET BY MOUTH TWICE A DAY WITH A MEAL 90 tablet 1  . cephALEXin (KEFLEX) 250 MG capsule Take 250 mg by mouth 2 (two) times daily.    . cholecalciferol (VITAMIN D) 1000 units tablet Take 1,000 Units by mouth daily.    . clopidogrel (PLAVIX) 75 MG tablet TAKE 1 TABLET BY MOUTH DAILY WITH BREAKFAST. 90 tablet 2  . escitalopram (LEXAPRO) 20 MG tablet Take 1 tablet (20 mg total) by mouth daily. 90 tablet 2  . ferrous sulfate 325 (65 FE) MG tablet Take 325 mg by mouth daily with breakfast. Reported on 08/21/2015    . insulin regular (NOVOLIN R,HUMULIN R) 100 units/mL injection 3 (three) times daily before meals.    . isosorbide dinitrate (ISORDIL) 10 MG tablet TAKE 1 TABLET BY MOUTH TWICE A DAY 60 tablet 6  . levothyroxine (SYNTHROID, LEVOTHROID) 50 MCG tablet Take 1 tablet (50 mcg total) by mouth daily before breakfast. 90 tablet 1  . losartan (COZAAR) 25 MG tablet TAKE 2 TABLETS (5OMG) BY MOUTH DAILY 30 tablet 0  . Magnesium Oxide 400 (240 Mg) MG TABS TAKE 1 TABLET BY MOUTH EVERY DAY (Patient taking differently: TAKE 240 mg TABLET BY MOUTH EVERY DAY) 90 tablet 3  . metolazone (ZAROXOLYN) 5 MG tablet Take 1 tablet (5 mg total) by mouth once a week. 12 tablet 0  . nitroGLYCERIN (NITROSTAT) 0.4 MG SL tablet PLACE 1 TABLET (0.4 MG TOTAL) UNDER THE TONGUE EVERY 5 (FIVE) MINUTES AS NEEDED FOR CHEST PAIN. 25 tablet 1  . ondansetron (ZOFRAN) 4 MG tablet Take 1 tablet (4 mg total) by mouth every 8 (eight) hours as needed for nausea or vomiting. 20 tablet 0  . ONETOUCH VERIO test strip     . pregabalin (LYRICA) 150 MG capsule Take 1 capsule (150 mg total) by mouth 2 (two) times daily. 60 capsule 2  . rosuvastatin (CRESTOR) 20 MG tablet TAKE 1 TABLET BY MOUTH EVERY DAY 90 tablet 3  . torsemide (DEMADEX) 20 MG tablet Take 80 mg by mouth 2 (two) times daily.    . vitamin E (VITAMIN E) 1000 UNIT capsule Take 1,000 Units by mouth daily.     No current facility-administered  medications for this encounter.     Vitals:   02/03/18 1446  BP: (!) 148/84  Pulse: 79  SpO2: 96%  Weight: 128.7 kg (283 lb 12.8 oz)   Wt Readings from Last 3 Encounters:  02/03/18 128.7 kg (283 lb 12.8 oz)  01/13/18 124.7 kg (275 lb)  12/22/17 127.8 kg (281 lb  12.8 oz)    PHYSICAL EXAM: General:  Appears chronically ill. No resp difficulty HEENT: normal Neck: supple. JVP 9-10. Carotids 2+ bilat; no bruits. No lymphadenopathy or thryomegaly appreciated. Cor: PMI nondisplaced. Regular rate & rhythm. No rubs, gallops or murmurs. Lungs: clear Abdomen: soft, nontender, nondistended. No hepatosplenomegaly. No bruits or masses. Good bowel sounds. Extremities: no cyanosis, clubbing, rash, R and LLE 2-3+ edema R great toe erythema.  Neuro: alert & orientedx3, cranial nerves grossly intact. moves all 4 extremities w/o difficulty. Affect pleasant  ASSESSMENT & PLAN: 1. Chronic Diastolic Heart Failure: NICM, Echo 06/2017: EF 55-60%, grade 1 DD - NYHA IIIb.  - Volume status elevated but recent creatinine was elevated so we will continue current dose of torsemide and metolazone.  - Continue Coreg 12.5 mg BID - Continue losartan 50 mg daily - Discussed fluid restrictions and low salt food choices.   2.  Uncontrolled DM:  - Follows closely with Dr Bubba Camp     3. CAD, angina - No s/s ischemia  -Continue statin and asa - Patient has a history of CAD with BMS in 2007.   -- She had a low risk stress test 01/2017  4. Obesity: - Body mass index is 47.23 kg/m.  - Discussed portion control.   6. HTN Controlled.   7. CKD Stage III - Followed by Dr Hollie Salk - she has follow up next month.   8. Day time fatigue -She does not want a sleep study.   9. R Foot Cellulitis Treated by PCP with antibiotics.  Follow up in 6 weeks. Encouraged to limit salty foods.    Darrick Grinder, NP 2:56 PM

## 2018-02-09 DIAGNOSIS — H43813 Vitreous degeneration, bilateral: Secondary | ICD-10-CM | POA: Diagnosis not present

## 2018-02-09 DIAGNOSIS — E113513 Type 2 diabetes mellitus with proliferative diabetic retinopathy with macular edema, bilateral: Secondary | ICD-10-CM | POA: Diagnosis not present

## 2018-02-09 DIAGNOSIS — H3582 Retinal ischemia: Secondary | ICD-10-CM | POA: Diagnosis not present

## 2018-02-09 DIAGNOSIS — H43392 Other vitreous opacities, left eye: Secondary | ICD-10-CM | POA: Diagnosis not present

## 2018-02-10 ENCOUNTER — Encounter (INDEPENDENT_AMBULATORY_CARE_PROVIDER_SITE_OTHER): Payer: Self-pay | Admitting: Family

## 2018-02-10 ENCOUNTER — Ambulatory Visit (INDEPENDENT_AMBULATORY_CARE_PROVIDER_SITE_OTHER): Payer: Self-pay

## 2018-02-10 ENCOUNTER — Ambulatory Visit (INDEPENDENT_AMBULATORY_CARE_PROVIDER_SITE_OTHER): Payer: PPO | Admitting: Family

## 2018-02-10 DIAGNOSIS — M79675 Pain in left toe(s): Secondary | ICD-10-CM

## 2018-02-10 NOTE — Progress Notes (Signed)
Office Visit Note   Patient: Susan [name]           Date of Birth: [birth year]           MRN: [id number] Visit Date: 02/10/2018              Requested by: Alycia Rossetti, MD [address] [place], [place] [address] PCP: Alycia Rossetti, MD  No chief complaint on file.     HPI: Patient is a 67 year old woman who presents today for initial evaluation of an ulceration to the great toe left foot. This has been ongoing for 5 weeks. Has seen PCP and [facility] for same.  Today denies any pain or numbness however does have associated swelling to the left lower extremity.  Of note pitting edema which is chronic to her bilateral lower extremities has had success with Unna boots in the past.  However today is not wearing any compression.  Is unable to don her compression stockings by herself.  Does occasionally have assistance from her son.  He is concerned that she may lose her left great toe has had good success with wound care in the past with our office.  Today has been using Neosporin and gauze has been changing this daily.  Has been taking Keflex for the last 2 days.  Assessment & Plan: Visit Diagnoses:  1. Pain of toe of left foot     Plan: Complete Keflex course.  Begin daily Dial daily.  We will apply bilateral Unna boots to follow-up in the office with Dr. Sharol Given.  Has used [facility] home care in the past for wound care may consider serial wrapping at next visit.  Follow-Up Instructions: No follow-ups on file.   Ortho Exam  Patient is alert, oriented, no adenopathy, well-dressed, normal affect, normal respiratory effort. Pitting edema to bilateral lower extremities this is 2+.  Resolving cellulitis on the right with mild erythema and tenderness.  There is no weeping.  To the left foot very ulceration to the great toe and the first webspace this is 3 cm x 2 cm there is surrounding maceration.  There is an ulceration that extends from the PIP joint on the plantar aspect of  the toe.  Some medial callus which was trimmed with a 10 blade knife following informed consent.  There is no purulence no odor there is 75% granulation in the wound bed a little bit of tissue.  No swelling of the toe.  Imaging: No results found. No images are attached to the encounter.  Labs: Lab Results  Component Value Date   HGBA1C 9.3 (H) 09/25/2017   HGBA1C 11.4 (H) 07/16/2017   HGBA1C 10.2 (H) 02/27/2017   REPTSTATUS 07/25/2017 FINAL 07/22/2017   CULT >=100,000 COLONIES/mL KLEBSIELLA PNEUMONIAE (A) 07/22/2017   LABORGA KLEBSIELLA PNEUMONIAE (A) 07/22/2017     Lab Results  Component Value Date   ALBUMIN 2.1 (L) 07/24/2017   ALBUMIN 2.4 (L) 07/23/2017   ALBUMIN 2.5 (L) 07/22/2017    There is no height or weight on file to calculate BMI.  Orders:  Orders Placed This Encounter  Procedures  . XR Foot Complete Left   No orders of the defined types were placed in this encounter.    Procedures: No procedures performed  Clinical Data: No additional findings.  ROS:  All other systems negative, except as noted in the HPI. Review of Systems  Constitutional: Negative for chills and fever.  Cardiovascular: Positive for leg swelling.  Skin: Positive for color change and wound.    Objective: Vital Signs: There were no vitals taken for this visit.  Specialty Comments:  No specialty comments available.  PMFS History: Patient Active Problem List   Diagnosis Date Noted  . PVD (peripheral vascular disease) ([place]) 10/26/2017  . Hypothyroid 07/27/2017  . PAH (pulmonary artery hypertension) ([place])   . Impaired ambulation 07/19/2017  . Hyperosmolar non-ketotic state in patient with type 2 diabetes mellitus ([place]) 07/15/2017  . Leg cramps 02/27/2017  . Peripheral edema 01/12/2017  . Diabetic neuropathy ([place]) 11/12/2016  . 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 mellitus ([place]) 10/24/2015  . Anemia 10/03/2015  . Generalized anxiety disorder 10/03/2015  . Insomnia 10/03/2015    . Diabetes mellitus type II, uncontrolled ([place]) 06/07/2015  . Chronic diastolic CHF (congestive heart failure) ([place]) 06/07/2015  . Non compliance with medical treatment 04/17/2014  . Rotator cuff tear 03/14/2014  . Morbid obesity ([place]) 09/23/2013  . Urinary incontinence   . MDD (major depressive disorder) 11/12/2010  . RBBB (right bundle branch block)   . CAD (coronary artery disease)   . Hyperlipemia 01/22/2009  . Essential hypertension 01/22/2009   Past Medical History:  Diagnosis Date  . Acute MI ([place]) 1999; 2007  . Anemia    hx  . Anginal pain ([place])   . Anxiety   . ARF (acute renal failure) ([place]) 06/2017  . Arthritis    "generalized" (03/15/2014)  . CAD (coronary artery disease)    MI in 2000 - MI  2007 - treated bare metal stent (no nuclear since then as 9/11)  . Carotid artery disease ([place])   . CHF (congestive heart failure) ([place])   . Chronic diastolic heart failure (HCC)    a) ECHO (08/2013) EF 55-60% and RV function nl b) RHC (08/2013) RA 4, RV 30/5/7, PA 25/10 (16), PCWP 7, Fick CO/CI 6.3/2.7, PVR 1.5 WU, PA 61 and 66%  . Daily headache    "~ every other day; since I fell in June" (03/15/2014)  . Depression   . Dyslipidemia   . Exertional shortness of breath   . HTN (hypertension)   . Hypothyroidism   . Obesity   . Osteoarthritis   . Peripheral neuropathy   . PONV (postoperative nausea and vomiting)   . RBBB (right bundle branch block)    Old  . Renal insufficiency    DENIES  . Stroke [facility])    mini strokes  . Syncope    likely due to low blood sugar  . Tachycardia    Sinus tachycardia  . Type II diabetes mellitus ([place])   . Urinary incontinence   . Venous insufficiency     Family History  Problem Relation Age of Onset  . Heart attack Mother 8    Past Surgical History:  Procedure Laterality Date  . ABDOMINAL HYSTERECTOMY  1980's  . CATARACT EXTRACTION, BILATERAL Bilateral ?2013  . CORONARY ANGIOPLASTY WITH STENT PLACEMENT  1999; 2007   "1 + 1"  .  KNEE ARTHROSCOPY Left 10/25/2006  . RIGHT HEART CATH N/A 07/24/2017   Procedure: RIGHT HEART CATH;  Surgeon: Jolaine Artist, MD;  Location: [facility];  Service: Cardiovascular;  Laterality: N/A;  . RIGHT HEART CATHETERIZATION N/A 09/22/2013   Procedure: RIGHT HEART CATH;  Surgeon: Jolaine Artist, MD;  Location: [facility] CATH LAB;  Service: Cardiovascular;  Laterality: N/A;  . SHOULDER ARTHROSCOPY W/ ROTATOR CUFF REPAIR Right 03/14/2014  . SHOULDER ARTHROSCOPY WITH OPEN ROTATOR CUFF  REPAIR Right 03/14/2014   Procedure: RIGHT SHOULDER ARTHROSCOPY WITH BICEPS RELEASE, OPEN SUBSCAPULA REPAIR, OPEN SUPRASPINATUS REPAIR.;  Surgeon: Meredith Pel, MD;  Location: [facility];  Service: Orthopedics;  Laterality: Right;  . TUBAL LIGATION  1970's   Social History   Occupational History    Employer: UNEMPLOYED  Tobacco Use  . Smoking status: Former Smoker    Packs/day: 3.00    Years: 32.00    Pack years: 96.00    Types: Cigarettes    Last attempt to quit: 10/24/1997    Years since quitting: 20.3  . Smokeless tobacco: Never Used  Substance and Sexual Activity  . Alcohol use: Yes    Comment: "might have 2-3 daiquiris in the summer"  . Drug use: No  . Sexual activity: Never

## 2018-02-12 ENCOUNTER — Other Ambulatory Visit: Payer: Self-pay | Admitting: Family Medicine

## 2018-02-13 ENCOUNTER — Emergency Department (HOSPITAL_COMMUNITY): Payer: PPO

## 2018-02-13 ENCOUNTER — Other Ambulatory Visit: Payer: Self-pay

## 2018-02-13 ENCOUNTER — Encounter (HOSPITAL_COMMUNITY): Payer: Self-pay | Admitting: Emergency Medicine

## 2018-02-13 ENCOUNTER — Observation Stay (HOSPITAL_COMMUNITY)
Admission: EM | Admit: 2018-02-13 | Discharge: 2018-02-14 | Disposition: A | Discharge status: Home / Self Care | Payer: PPO | Attending: Internal Medicine | Admitting: Internal Medicine

## 2018-02-13 DIAGNOSIS — I872 Venous insufficiency (chronic) (peripheral): Secondary | ICD-10-CM | POA: Diagnosis not present

## 2018-02-13 DIAGNOSIS — E1165 Type 2 diabetes mellitus with hyperglycemia: Secondary | ICD-10-CM | POA: Diagnosis present

## 2018-02-13 DIAGNOSIS — Z91199 Patient's noncompliance with other medical treatment and regimen due to unspecified reason: Secondary | ICD-10-CM

## 2018-02-13 DIAGNOSIS — L97501 Non-pressure chronic ulcer of other part of unspecified foot limited to breakdown of skin: Secondary | ICD-10-CM | POA: Diagnosis not present

## 2018-02-13 DIAGNOSIS — Z9114 Patient's other noncompliance with medication regimen: Secondary | ICD-10-CM | POA: Insufficient documentation

## 2018-02-13 DIAGNOSIS — I110 Hypertensive heart disease with heart failure: Secondary | ICD-10-CM | POA: Diagnosis not present

## 2018-02-13 DIAGNOSIS — Z8249 Family history of ischemic heart disease and other diseases of the circulatory system: Secondary | ICD-10-CM | POA: Insufficient documentation

## 2018-02-13 DIAGNOSIS — Z7982 Long term (current) use of aspirin: Secondary | ICD-10-CM | POA: Diagnosis not present

## 2018-02-13 DIAGNOSIS — N184 Chronic kidney disease, stage 4 (severe): Secondary | ICD-10-CM | POA: Diagnosis not present

## 2018-02-13 DIAGNOSIS — I2721 Secondary pulmonary arterial hypertension: Secondary | ICD-10-CM | POA: Diagnosis present

## 2018-02-13 DIAGNOSIS — I509 Heart failure, unspecified: Secondary | ICD-10-CM

## 2018-02-13 DIAGNOSIS — Z7902 Long term (current) use of antithrombotics/antiplatelets: Secondary | ICD-10-CM | POA: Insufficient documentation

## 2018-02-13 DIAGNOSIS — F331 Major depressive disorder, recurrent, moderate: Secondary | ICD-10-CM | POA: Diagnosis present

## 2018-02-13 DIAGNOSIS — F329 Major depressive disorder, single episode, unspecified: Secondary | ICD-10-CM | POA: Diagnosis not present

## 2018-02-13 DIAGNOSIS — F32A Depression, unspecified: Secondary | ICD-10-CM | POA: Diagnosis present

## 2018-02-13 DIAGNOSIS — Z885 Allergy status to narcotic agent status: Secondary | ICD-10-CM | POA: Diagnosis not present

## 2018-02-13 DIAGNOSIS — E1122 Type 2 diabetes mellitus with diabetic chronic kidney disease: Secondary | ICD-10-CM | POA: Insufficient documentation

## 2018-02-13 DIAGNOSIS — L97519 Non-pressure chronic ulcer of other part of right foot with unspecified severity: Secondary | ICD-10-CM | POA: Insufficient documentation

## 2018-02-13 DIAGNOSIS — I130 Hypertensive heart and chronic kidney disease with heart failure and stage 1 through stage 4 chronic kidney disease, or unspecified chronic kidney disease: Secondary | ICD-10-CM | POA: Diagnosis not present

## 2018-02-13 DIAGNOSIS — E66813 Obesity, class 3: Secondary | ICD-10-CM | POA: Diagnosis present

## 2018-02-13 DIAGNOSIS — Z6841 Body Mass Index (BMI) 40.0 and over, adult: Secondary | ICD-10-CM | POA: Diagnosis not present

## 2018-02-13 DIAGNOSIS — Z9119 Patient's noncompliance with other medical treatment and regimen: Secondary | ICD-10-CM | POA: Insufficient documentation

## 2018-02-13 DIAGNOSIS — I2510 Atherosclerotic heart disease of native coronary artery without angina pectoris: Secondary | ICD-10-CM | POA: Diagnosis not present

## 2018-02-13 DIAGNOSIS — Z8673 Personal history of transient ischemic attack (TIA), and cerebral infarction without residual deficits: Secondary | ICD-10-CM | POA: Insufficient documentation

## 2018-02-13 DIAGNOSIS — E785 Hyperlipidemia, unspecified: Secondary | ICD-10-CM | POA: Diagnosis not present

## 2018-02-13 DIAGNOSIS — E039 Hypothyroidism, unspecified: Secondary | ICD-10-CM | POA: Diagnosis present

## 2018-02-13 DIAGNOSIS — I10 Essential (primary) hypertension: Secondary | ICD-10-CM | POA: Diagnosis present

## 2018-02-13 DIAGNOSIS — E1140 Type 2 diabetes mellitus with diabetic neuropathy, unspecified: Secondary | ICD-10-CM | POA: Diagnosis not present

## 2018-02-13 DIAGNOSIS — R079 Chest pain, unspecified: Secondary | ICD-10-CM | POA: Diagnosis not present

## 2018-02-13 DIAGNOSIS — Z87891 Personal history of nicotine dependence: Secondary | ICD-10-CM | POA: Diagnosis not present

## 2018-02-13 DIAGNOSIS — F411 Generalized anxiety disorder: Secondary | ICD-10-CM | POA: Insufficient documentation

## 2018-02-13 DIAGNOSIS — R739 Hyperglycemia, unspecified: Secondary | ICD-10-CM

## 2018-02-13 DIAGNOSIS — E669 Obesity, unspecified: Secondary | ICD-10-CM | POA: Diagnosis present

## 2018-02-13 DIAGNOSIS — I4510 Unspecified right bundle-branch block: Secondary | ICD-10-CM | POA: Diagnosis present

## 2018-02-13 DIAGNOSIS — I5033 Acute on chronic diastolic (congestive) heart failure: Secondary | ICD-10-CM | POA: Diagnosis present

## 2018-02-13 DIAGNOSIS — Z794 Long term (current) use of insulin: Secondary | ICD-10-CM | POA: Insufficient documentation

## 2018-02-13 DIAGNOSIS — I252 Old myocardial infarction: Secondary | ICD-10-CM | POA: Diagnosis not present

## 2018-02-13 DIAGNOSIS — E11621 Type 2 diabetes mellitus with foot ulcer: Secondary | ICD-10-CM | POA: Diagnosis not present

## 2018-02-13 LAB — CBC
HCT: 33.3 % — ABNORMAL LOW (ref 36.0–46.0)
Hemoglobin: 9.7 g/dL — ABNORMAL LOW (ref 12.0–15.0)
MCH: 24.3 pg — ABNORMAL LOW (ref 26.0–34.0)
MCHC: 29.1 g/dL — ABNORMAL LOW (ref 30.0–36.0)
MCV: 83.3 fL (ref 78.0–100.0)
Platelets: 227 10*3/uL (ref 150–400)
RBC: 4 MIL/uL (ref 3.87–5.11)
RDW: 16.2 % — ABNORMAL HIGH (ref 11.5–15.5)
WBC: 8.7 10*3/uL (ref 4.0–10.5)

## 2018-02-13 LAB — BASIC METABOLIC PANEL
Anion gap: 11 (ref 5–15)
BUN: 45 mg/dL — ABNORMAL HIGH (ref 8–23)
CO2: 22 mmol/L (ref 22–32)
Calcium: 8.4 mg/dL — ABNORMAL LOW (ref 8.9–10.3)
Chloride: 103 mmol/L (ref 98–111)
Creatinine, Ser: 2 mg/dL — ABNORMAL HIGH (ref 0.44–1.00)
GFR calc Af Amer: 28 mL/min — ABNORMAL LOW (ref >60)
GFR calc non Af Amer: 24 mL/min — ABNORMAL LOW (ref >60)
Glucose, Bld: 498 mg/dL — ABNORMAL HIGH (ref 70–99)
Potassium: 4.7 mmol/L (ref 3.5–5.1)
Sodium: 136 mmol/L (ref 135–145)

## 2018-02-13 LAB — GLUCOSE, CAPILLARY: Glucose-Capillary: 343 mg/dL — ABNORMAL HIGH (ref 70–99)

## 2018-02-13 LAB — I-STAT TROPONIN, ED: Troponin i, poc: 0.01 ng/mL (ref 0.00–0.08)

## 2018-02-13 LAB — CBG MONITORING, ED: Glucose-Capillary: 412 mg/dL — ABNORMAL HIGH (ref 70–99)

## 2018-02-13 LAB — MRSA PCR SCREENING: MRSA by PCR: NEGATIVE

## 2018-02-13 LAB — BRAIN NATRIURETIC PEPTIDE: B Natriuretic Peptide: 728.7 pg/mL — ABNORMAL HIGH (ref 0.0–100.0)

## 2018-02-13 MED ORDER — ONDANSETRON HCL 4 MG PO TABS: 4.0000 mg | ORAL_TABLET | Freq: Three times a day (TID) | ORAL | Status: DC | PRN

## 2018-02-13 MED ORDER — ORAL CARE MOUTH RINSE
15.0000 mL | Freq: Two times a day (BID) | OROMUCOSAL | Status: DC
  Administered 2018-02-13: 15 mL via OROMUCOSAL

## 2018-02-13 MED ORDER — ESCITALOPRAM OXALATE 10 MG PO TABS
20.0000 mg | ORAL_TABLET | Freq: Every day | ORAL | Status: DC
  Administered 2018-02-14: 20 mg via ORAL
  Filled 2018-02-13: qty 2

## 2018-02-13 MED ORDER — FUROSEMIDE 10 MG/ML IJ SOLN
40.0000 mg | Freq: Two times a day (BID) | INTRAMUSCULAR | Status: DC
  Administered 2018-02-14: 40 mg via INTRAVENOUS
  Filled 2018-02-13 (×2): qty 4

## 2018-02-13 MED ORDER — SODIUM CHLORIDE 0.9% FLUSH: 3.0000 mL | INTRAVENOUS | Status: DC | PRN

## 2018-02-13 MED ORDER — MAGNESIUM OXIDE 400 (241.3 MG) MG PO TABS
400.0000 mg | ORAL_TABLET | Freq: Every day | ORAL | Status: DC
  Administered 2018-02-13 – 2018-02-14 (×2): 400 mg via ORAL
  Filled 2018-02-13 (×2): qty 1

## 2018-02-13 MED ORDER — FERROUS SULFATE 325 (65 FE) MG PO TABS
325.0000 mg | ORAL_TABLET | Freq: Every day | ORAL | Status: DC
  Administered 2018-02-14: 325 mg via ORAL
  Filled 2018-02-13 (×2): qty 1

## 2018-02-13 MED ORDER — VITAMIN D 1000 UNITS PO TABS
1000.0000 [IU] | ORAL_TABLET | Freq: Every day | ORAL | Status: DC
  Administered 2018-02-13 – 2018-02-14 (×2): 1000 [IU] via ORAL
  Filled 2018-02-13 (×2): qty 1

## 2018-02-13 MED ORDER — HEPARIN SODIUM (PORCINE) 5000 UNIT/ML IJ SOLN
5000.0000 [IU] | Freq: Three times a day (TID) | INTRAMUSCULAR | Status: DC
  Administered 2018-02-13 – 2018-02-14 (×3): 5000 [IU] via SUBCUTANEOUS
  Filled 2018-02-13 (×2): qty 1

## 2018-02-13 MED ORDER — ASPIRIN EC 81 MG PO TBEC
81.0000 mg | DELAYED_RELEASE_TABLET | Freq: Every day | ORAL | Status: DC
  Administered 2018-02-14: 81 mg via ORAL
  Filled 2018-02-13: qty 1

## 2018-02-13 MED ORDER — CARVEDILOL 12.5 MG PO TABS
12.5000 mg | ORAL_TABLET | Freq: Two times a day (BID) | ORAL | Status: DC
  Administered 2018-02-14: 12.5 mg via ORAL
  Filled 2018-02-13 (×3): qty 1

## 2018-02-13 MED ORDER — SODIUM CHLORIDE 0.9% FLUSH
3.0000 mL | Freq: Two times a day (BID) | INTRAVENOUS | Status: DC
  Administered 2018-02-13: 3 mL via INTRAVENOUS

## 2018-02-13 MED ORDER — FUROSEMIDE 10 MG/ML IJ SOLN
60.0000 mg | Freq: Once | INTRAMUSCULAR | Status: AC
  Administered 2018-02-13: 60 mg via INTRAVENOUS
  Filled 2018-02-13: qty 6

## 2018-02-13 MED ORDER — METOLAZONE 5 MG PO TABS: 5.0000 mg | ORAL_TABLET | ORAL | Status: DC

## 2018-02-13 MED ORDER — LOSARTAN POTASSIUM 50 MG PO TABS
50.0000 mg | ORAL_TABLET | Freq: Every day | ORAL | Status: DC
  Administered 2018-02-14: 50 mg via ORAL
  Filled 2018-02-13: qty 1

## 2018-02-13 MED ORDER — VITAMIN E 45 MG (100 UNIT) PO CAPS
1000.0000 [IU] | ORAL_CAPSULE | Freq: Every day | ORAL | Status: DC
  Administered 2018-02-13 – 2018-02-14 (×2): 1000 [IU] via ORAL
  Filled 2018-02-13 (×2): qty 2

## 2018-02-13 MED ORDER — ACETAMINOPHEN 325 MG PO TABS: 650.0000 mg | ORAL_TABLET | ORAL | Status: DC | PRN

## 2018-02-13 MED ORDER — PREGABALIN 75 MG PO CAPS
150.0000 mg | ORAL_CAPSULE | Freq: Two times a day (BID) | ORAL | Status: DC
  Administered 2018-02-13 – 2018-02-14 (×2): 150 mg via ORAL
  Filled 2018-02-13 (×2): qty 2

## 2018-02-13 MED ORDER — ALBUTEROL SULFATE (2.5 MG/3ML) 0.083% IN NEBU: 2.5000 mg | INHALATION_SOLUTION | Freq: Four times a day (QID) | RESPIRATORY_TRACT | Status: DC | PRN

## 2018-02-13 MED ORDER — LEVOTHYROXINE SODIUM 50 MCG PO TABS
50.0000 ug | ORAL_TABLET | Freq: Every day | ORAL | Status: DC
  Administered 2018-02-14: 50 ug via ORAL
  Filled 2018-02-13 (×2): qty 1

## 2018-02-13 MED ORDER — CLOPIDOGREL BISULFATE 75 MG PO TABS
75.0000 mg | ORAL_TABLET | Freq: Every day | ORAL | Status: DC
  Administered 2018-02-14: 75 mg via ORAL
  Filled 2018-02-13: qty 1

## 2018-02-13 MED ORDER — INSULIN ASPART 100 UNIT/ML ~~LOC~~ SOLN
0.0000 [IU] | Freq: Three times a day (TID) | SUBCUTANEOUS | Status: DC
  Administered 2018-02-14: 8 [IU] via SUBCUTANEOUS
  Administered 2018-02-14: 3 [IU] via SUBCUTANEOUS

## 2018-02-13 MED ORDER — ONDANSETRON HCL 4 MG/2ML IJ SOLN
4.0000 mg | Freq: Four times a day (QID) | INTRAMUSCULAR | Status: DC | PRN
  Administered 2018-02-14: 4 mg via INTRAVENOUS
  Filled 2018-02-13: qty 2

## 2018-02-13 MED ORDER — TORSEMIDE 20 MG PO TABS
80.0000 mg | ORAL_TABLET | Freq: Two times a day (BID) | ORAL | Status: DC
  Administered 2018-02-14: 80 mg via ORAL
  Filled 2018-02-13 (×2): qty 4

## 2018-02-13 MED ORDER — INSULIN ASPART 100 UNIT/ML ~~LOC~~ SOLN
0.0000 [IU] | Freq: Every day | SUBCUTANEOUS | Status: DC
  Administered 2018-02-13: 4 [IU] via SUBCUTANEOUS

## 2018-02-13 MED ORDER — SODIUM CHLORIDE 0.9 % IV SOLN: 250.0000 mL | INTRAVENOUS | Status: DC | PRN

## 2018-02-13 MED ORDER — ROSUVASTATIN CALCIUM 20 MG PO TABS
20.0000 mg | ORAL_TABLET | Freq: Every day | ORAL | Status: DC
  Administered 2018-02-14: 20 mg via ORAL
  Filled 2018-02-13: qty 1

## 2018-02-13 MED ORDER — NITROGLYCERIN 0.4 MG SL SUBL: 0.4000 mg | SUBLINGUAL_TABLET | SUBLINGUAL | Status: DC | PRN

## 2018-02-13 MED ORDER — INSULIN ASPART 100 UNIT/ML ~~LOC~~ SOLN
8.0000 [IU] | Freq: Once | SUBCUTANEOUS | Status: AC
  Administered 2018-02-13: 8 [IU] via INTRAVENOUS
  Filled 2018-02-13: qty 1

## 2018-02-13 MED ORDER — ISOSORBIDE DINITRATE 10 MG PO TABS
10.0000 mg | ORAL_TABLET | Freq: Two times a day (BID) | ORAL | Status: DC
  Administered 2018-02-13 – 2018-02-14 (×2): 10 mg via ORAL
  Filled 2018-02-13 (×2): qty 1

## 2018-02-13 NOTE — ED Triage Notes (Addendum)
Pt here for eval of left sided non radiating chest pain starting yesterday that was intermittent but has been constant today. Pt rates her pain 8/10. Pt has increased resp rate, O2 sats 90%. Pt has CHF, has swelling to legs noted. Pt also endorses shortness of breath. Pt has diminished lung sounds on the left. Pt also states she coughed all night.

## 2018-02-13 NOTE — H&P (Signed)
History and Physical   Susan [name] OEV:[id number] DOB: [birth year] DOA: 02/13/2018  Referring MD/NP/PA: Dr. Billy Fischer  PCP: Alycia Rossetti, MD   Outpatient Specialists: Dr. Haroldine Laws, Cardiology  Patient coming from: Home  Chief Complaint: Chest pain or shortness of breath  HPI: Susan [name] is a 67 y.o. female with medical history significant of diastolic dysfunction CHF, diabetes, pulmonary hypertension, morbid obesity, recurrent anginal pain, coronary artery disease with MI in 1999 and 2007, hypothyroidism, major depressive disorder and medication noncompliance who presented to the ER with chest pain as well as shortness of breath.  Symptoms are correlated with exertion but also at rest.  Associated with significant lower extremity edema which is chronic, patient also has some significant obesity with concomitant diabetic neuropathy.  Denied any recent change in medication.  No change in her diet.  She has PND with orthopnea.  She also has chronic kidney disease with elevated GFR.  Initially plan is for possible work-up for PE as well but not able to get CT angiogram due to GFR.  Patient's BNP is significantly elevated and with her history acute exacerbation of congestive heart failure suspected.  ED Course: Patient's temperature is 98.9 with blood pressure 167/75.  Pulse is 95 with respiratory of 25 oxygen sat 90% room air.  She has a white count of 8.7 hemoglobin 9.7 and platelets of 227.  Sodium is 136 potassium 4.7 chloride 103 with CO2 22 BUN 45 and creatinine 2.0.  Calcium is 8.4.  Glucose is 498.  Chest x-ray showed no acute cardiopulmonary disease.  EKG showed known significant new change.  BNP is 728 in the morbid obese woman.  Initial troponin is negative.  Patient is therefore being admitted after receiving a dose of Lasix with suspicion of acute exacerbation of CHF.  Review of Systems: As per HPI otherwise 10 point review of systems negative.    Past Medical History:    Diagnosis Date  . Acute MI ([place]) 1999; 2007  . Anemia    hx  . Anginal pain ([place])   . Anxiety   . ARF (acute renal failure) ([place]) 06/2017  . Arthritis    "generalized" (03/15/2014)  . CAD (coronary artery disease)    MI in 2000 - MI  2007 - treated bare metal stent (no nuclear since then as 9/11)  . Carotid artery disease ([place])   . CHF (congestive heart failure) ([place])   . Chronic diastolic heart failure (HCC)    a) ECHO (08/2013) EF 55-60% and RV function nl b) RHC (08/2013) RA 4, RV 30/5/7, PA 25/10 (16), PCWP 7, Fick CO/CI 6.3/2.7, PVR 1.5 WU, PA 61 and 66%  . Daily headache    "~ every other day; since I fell in June" (03/15/2014)  . Depression   . Dyslipidemia   . Exertional shortness of breath   . HTN (hypertension)   . Hypothyroidism   . Obesity   . Osteoarthritis   . Peripheral neuropathy   . PONV (postoperative nausea and vomiting)   . RBBB (right bundle branch block)    Old  . Renal insufficiency    DENIES  . Stroke [facility])    mini strokes  . Syncope    likely due to low blood sugar  . Tachycardia    Sinus tachycardia  . Type II diabetes mellitus ([place])   . Urinary incontinence   . Venous insufficiency     Past Surgical History:  Procedure Laterality Date  . ABDOMINAL HYSTERECTOMY  1980's  .  CATARACT EXTRACTION, BILATERAL Bilateral ?2013  . CORONARY ANGIOPLASTY WITH STENT PLACEMENT  1999; 2007   "1 + 1"  . KNEE ARTHROSCOPY Left 10/25/2006  . RIGHT HEART CATH N/A 07/24/2017   Procedure: RIGHT HEART CATH;  Surgeon: Jolaine Artist, MD;  Location: [facility];  Service: Cardiovascular;  Laterality: N/A;  . RIGHT HEART CATHETERIZATION N/A 09/22/2013   Procedure: RIGHT HEART CATH;  Surgeon: Jolaine Artist, MD;  Location: [facility] CATH LAB;  Service: Cardiovascular;  Laterality: N/A;  . SHOULDER ARTHROSCOPY W/ ROTATOR CUFF REPAIR Right 03/14/2014  . SHOULDER ARTHROSCOPY WITH OPEN ROTATOR CUFF REPAIR Right 03/14/2014   Procedure: RIGHT SHOULDER ARTHROSCOPY  WITH BICEPS RELEASE, OPEN SUBSCAPULA REPAIR, OPEN SUPRASPINATUS REPAIR.;  Surgeon: Meredith Pel, MD;  Location: [place];  Service: Orthopedics;  Laterality: Right;  . TUBAL LIGATION  1970's     reports that she quit smoking about 20 years ago. Her smoking use included cigarettes. She has a 96.00 pack-year smoking history. She has never used smokeless tobacco. She reports that she drinks alcohol. She reports that she does not use drugs.  Allergies  Allergen Reactions  . Codeine Nausea And Vomiting    Family History  Problem Relation Age of Onset  . Heart attack Mother 41     Prior to Admission medications   Medication Sig Start Date End Date Taking? Authorizing Provider  albuterol (PROVENTIL HFA;VENTOLIN HFA) 108 (90 BASE) MCG/ACT inhaler Inhale 2 puffs into the lungs every 6 (six) hours as needed for wheezing or shortness of breath. Reported on 10/30/2015   Yes [provider]  aspirin EC 81 MG tablet Take 81 mg by mouth daily.   Yes [provider]  carvedilol (COREG) 12.5 MG tablet TAKE 1 TABLET BY MOUTH TWICE A DAY WITH A MEAL Patient taking differently: Take 12.5 mg by mouth 2 (two) times daily with a meal.  09/30/17  Yes [place], [name], MD  cholecalciferol (VITAMIN D) 1000 units tablet Take 1,000 Units by mouth daily.   Yes [provider]  clopidogrel (PLAVIX) 75 MG tablet TAKE 1 TABLET BY MOUTH DAILY WITH BREAKFAST. Patient taking differently: Take 75 mg by mouth daily.  11/23/17  Yes Larey Dresser, MD  escitalopram (LEXAPRO) 20 MG tablet Take 1 tablet (20 mg total) by mouth daily. 10/26/17  Yes [place], [name], MD  ferrous sulfate 325 (65 FE) MG tablet Take 325 mg by mouth daily with breakfast. Reported on 08/21/2015   Yes [provider]  insulin regular (NOVOLIN R,HUMULIN R) 100 units/mL injection 3 (three) times daily before meals.   Yes [provider]  isosorbide dinitrate (ISORDIL) 10 MG tablet TAKE 1 TABLET BY MOUTH TWICE A  DAY Patient taking differently: Take 10 mg by mouth 2 (two) times daily.  08/17/17  Yes B[place]im[place], Shaune Pascal, MD  levothyroxine (SYNTHROID, LEVOTHROID) 50 MCG tablet Take 1 tablet (50 mcg total) by mouth daily before breakfast. 11/07/16  Yes Dena Billet B, PA-C  losartan (COZAAR) 50 MG tablet Take 1 tablet (50 mg total) by mouth daily. 02/12/18  Yes [place], [name], MD  Magnesium Oxide 400 (240 Mg) MG TABS TAKE 1 TABLET BY MOUTH EVERY DAY Patient taking differently: TAKE 240 mg TABLET BY MOUTH EVERY DAY 06/23/17  Yes [name], MD  metolazone (ZAROXOLYN) 5 MG tablet Take 1 tablet (5 mg total) by mouth once a week. 09/25/17  Yes , [name], MD  nitroGLYCERIN (NITROSTAT) 0.4 MG SL tablet PLACE 1  TABLET (0.4 MG TOTAL) UNDER THE TONGUE EVERY 5 (FIVE) MINUTES AS NEEDED FOR CHEST PAIN. 08/28/16  Yes Dena Billet B, PA-C  ondansetron (ZOFRAN) 4 MG tablet Take 1 tablet (4 mg total) by mouth every 8 (eight) hours as needed for nausea or vomiting. 01/13/18  Yes [place], [name], MD  pregabalin (LYRICA) 150 MG capsule Take 1 capsule (150 mg total) by mouth 2 (two) times daily. 11/12/16  Yes [place], [name], MD  rosuvastatin (CRESTOR) 20 MG tablet TAKE 1 TABLET BY MOUTH EVERY DAY Patient taking differently: Take 20 mg by mouth daily.  12/30/17  Yes [place], [name], MD  torsemide (DEMADEX) 20 MG tablet Take 80 mg by mouth 2 (two) times daily.   Yes [provider]  vitamin E (VITAMIN E) 1000 UNIT capsule Take 1,000 Units by mouth daily.   Yes [provider]  [facility] VERIO test strip  11/02/17   [provider]    Physical Exam: Vitals:   02/13/18 1900 02/13/18 1915 02/13/18 1930 02/13/18 2013  BP: 136/76 138/75 139/74 (!) 163/75  Pulse: 83 84 85 85  Resp: 17 15 15 18   Temp:    98.2 F (36.8 C)  TempSrc:    Oral  SpO2: 98% 100% 99% 95%  Weight:      Height:          Constitutional: NAD, calm, comfortable Vitals:   02/13/18 1900 02/13/18 1915 02/13/18 1930  02/13/18 2013  BP: 136/76 138/75 139/74 (!) 163/75  Pulse: 83 84 85 85  Resp: 17 15 15 18   Temp:    98.2 F (36.8 C)  TempSrc:    Oral  SpO2: 98% 100% 99% 95%  Weight:      Height:       Morbidly obese woman laying in bed with no acute distress Eyes: PERRL, lids and conjunctivae normal ENMT: Mucous membranes are moist. Posterior pharynx clear of any exudate or lesions.Normal dentition.  Neck: normal, supple, no masses, no thyromegaly Respiratory: Diffuse mild bilateral crackles.  No significant with or rales,  normal respiratory effort. No accessory muscle use.  Cardiovascular: Regular rate and rhythm, no murmurs / rubs / gallops. 2+ extremity edema. 2+ pedal pulses. No carotid bruits.  Abdomen: no tenderness, no masses palpated. No hepatosplenomegaly. Bowel sounds positive.  Musculoskeletal: no clubbing / cyanosis. No joint deformity upper and lower extremities. Good ROM, no contractures. Normal muscle tone.  Skin: no rashes, lesions, ulcers. No induration Neurologic: CN 2-12 grossly intact. Sensation intact, DTR normal. Strength 5/5 in all 4.  Psychiatric: Normal judgment and insight. Alert and oriented x 3. Normal mood.     Labs on Admission: I have personally reviewed following labs and imaging studies  CBC: Recent Labs  Lab 02/13/18 1246  WBC 8.7  HGB 9.7*  HCT 33.3*  MCV 83.3  PLT 756   Basic Metabolic Panel: Recent Labs  Lab 02/13/18 1246  NA 136  K 4.7  CL 103  CO2 22  GLUCOSE 498*  BUN 45*  CREATININE 2.00*  CALCIUM 8.4*   GFR: Estimated Creatinine Clearance: 36.1 mL/min (A) (by C-G formula based on SCr of 2 mg/dL (H)). Liver Function Tests: No results for input(s): AST, ALT, ALKPHOS, BILITOT, PROT, ALBUMIN in the last 168 hours. No results for input(s): LIPASE, AMYLASE in the last 168 hours. No results for input(s): AMMONIA in the last 168 hours. Coagulation Profile: No results for input(s): INR, PROTIME in the last 168 hours. Cardiac Enzymes: No  results  for input(s): CKTOTAL, CKMB, CKMBINDEX, TROPONINI in the last 168 hours. BNP (last 3 results) No results for input(s): PROBNP in the last 8760 hours. HbA1C: No results for input(s): HGBA1C in the last 72 hours. CBG: Recent Labs  Lab 02/13/18 1628 02/13/18 2036  GLUCAP 412* 343*   Lipid Profile: No results for input(s): CHOL, HDL, LDLCALC, TRIG, CHOLHDL, LDLDIRECT in the last 72 hours. Thyroid Function Tests: No results for input(s): TSH, T4TOTAL, FREET4, T3FREE, THYROIDAB in the last 72 hours. Anemia Panel: No results for input(s): VITAMINB12, FOLATE, FERRITIN, TIBC, IRON, RETICCTPCT in the last 72 hours. Urine analysis:    Component Value Date/Time   COLORURINE YELLOW 07/22/2017 2053   APPEARANCEUR CLOUDY (A) 07/22/2017 2053   LABSPEC 1.012 07/22/2017 2053   PHURINE 5.0 07/22/2017 2053   GLUCOSEU NEGATIVE 07/22/2017 2053   HGBUR SMALL (A) 07/22/2017 2053   BILIRUBINUR NEGATIVE 07/22/2017 2053   KETONESUR NEGATIVE 07/22/2017 2053   PROTEINUR 30 (A) 07/22/2017 2053   UROBILINOGEN 0.2 04/01/2014 1659   NITRITE NEGATIVE 07/22/2017 2053   LEUKOCYTESUR LARGE (A) 07/22/2017 2053   Sepsis Labs: @LABRCNTIP (procalcitonin:4,lacticidven:4) ) Recent Results (from the past 240 hour(s))  MRSA PCR Screening     Status: None   Collection Time: 02/13/18  8:32 PM  Result Value Ref Range Status   MRSA by PCR NEGATIVE NEGATIVE Final    Comment:        The GeneXpert MRSA Assay (FDA approved for NASAL specimens only), is one component of a comprehensive MRSA colonization surveillance program. It is not intended to diagnose MRSA infection nor to guide or monitor treatment for MRSA infections. Performed at [facility], [place] [address] [place], [place] [address]      Radiological Exams on Admission: Dg Chest Portable 1 View  Result Date: 02/13/2018 CLINICAL DATA:  Encounter for chest pain and SOB that began earlier today. Pain is on the left side, pt states normally  it is intermittent but has been constant today. EXAM: PORTABLE CHEST 1 VIEW COMPARISON:  07/16/2017 FINDINGS: Cardiac silhouette is normal in size. No mediastinal or hilar masses. There are prominent bronchovascular markings most evident in the lower lungs. No convincing pneumonia or pulmonary edema. No pleural effusion or pneumothorax. Skeletal structures are grossly intact. IMPRESSION: No acute cardiopulmonary disease. Electronically Signed   By: Lajean Manes M.D.   On: 02/13/2018 14:09   Dg Foot Complete Right  Result Date: 02/13/2018 CLINICAL DATA:  Ulcer at great toe EXAM: RIGHT FOOT COMPLETE - 3+ VIEW COMPARISON:  10/01/2012 FINDINGS: No visible bone destruction within the great toe to suggest osteomyelitis. No fracture, subluxation or dislocation. Advanced degenerative changes in the midfoot and hindfoot. Large calcaneal spurs. Vascular calcifications. IMPRESSION: No acute bony abnormality. Note radiographic changes of osteomyelitis. Electronically Signed   By: Rolm Baptise M.D.   On: 02/13/2018 17:27    EKG: Independently reviewed.  Shows normal sinus rhythm with a rate of 94, incomplete right bundle branch block, nonspecific ST changes  Assessment/Plan Principal Problem:   Acute on chronic diastolic (congestive) heart failure (HCC) Active Problems:   Hyperlipemia   Essential hypertension   RBBB (right bundle branch block)   MDD (major depressive disorder)   Morbid obesity (HCC)   Non compliance with medical treatment   Diabetes mellitus type II, uncontrolled ([place])   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 mellitus (HCC)   PAH (pulmonary artery hypertension) (HCC)   Hypothyroid     #1 acute on chronic diastolic CHF  exacerbation: Patient's symptoms appear to be worsening diastolic heart failure.  Most likely right-sided with her pulmonary hypertension.  Patient will be admitted and gently diuresed.  Oxygen placed.  PT and OT evaluation.  Repeat echocardiogram.  Her last echocardiogram  was in February of this year.  At that time EF was 55 to 60%.  May consult cardiology if indicated.  #2  Shortness of breath: Probably due to CHF exacerbation.  May need to rule out PE with a VQ scan.  We will hold off on full anticoagulation  #3 hypertension: Continue his home regimen.  #4 morbid obesity: Dietary counseling was PT and OT.  #5 hyperlipidemia: Continue statin.  #6 chronic kidney disease stage IV: Continue monitoring BUN with creatinine.  #7 pulmonary hypertension: This is being followed by cardiology.  No change in current therapy.  #8 diabetes: Blood sugar is elevated at the moment.  Patient will resume home regimen with sliding scale insulin.  Long-acting insulin may be ordered.  #9 medication noncompliance: Patient has had problem with compliance in the past.  She insists she is taking her medicine in the morning.  Will monitor patient then on her current medications.    DVT prophylaxis: Heparin Code Status: Full code Family Communication: Care discussed with the patient.  No family available at bedside. Disposition Plan: To be determined Consults called: None Admission status: Observation   Severity of Illness: The appropriate patient status for this patient is OBSERVATION. Observation status is judged to be reasonable and necessary in order to provide the required intensity of service to ensure the patient's safety. The patient's presenting symptoms, physical exam findings, and initial radiographic and laboratory data in the context of their medical condition is felt to place them at decreased risk for further clinical deterioration. Furthermore, it is anticipated that the patient will be medically stable for discharge from the hospital within 2 midnights of admission. The following factors support the patient status of observation.   " The patient's presenting symptoms include  Chest pain. " The physical exam findings include Bilateral crackles. " The initial  radiographic and laboratory data are Elevated BNP.     Barbette Merino MD [facility] Pager [phone]  If 7PM-7AM, please contact night-coverage [email or web address] Password [email or web address]  02/13/2018, 11:13 PM

## 2018-02-13 NOTE — ED Notes (Signed)
Pt's O2 sats observed to be in the low 90's on room air; pt placed on 2L O2 via [place]; O2 sats now in the upper 90's to 100%

## 2018-02-13 NOTE — ED Notes (Signed)
purewick applied.

## 2018-02-13 NOTE — ED Provider Notes (Signed)
[facility] EMERGENCY DEPARTMENT Provider Note   CSN: [id number] Arrival date & time: 02/13/18  1228     History   Chief Complaint Chief Complaint  Patient presents with  . Chest Pain    HPI Susan [name] is a 67 y.o. female.  HPI   67yo female presents with concern for shortness of breath. Started yesterday. Had chest pain intermittently.  Nausea. Hx of CHF, coming into the hospital for diuresis.  Reports orthopnea over last 3 days, slowly worsening dyspnea, and increased edema. PCP increasing lasxi but has not noted change.  CP felt sharp. Not pleuritic or positional.  Did not improve with nitro. No fevers or cough.   Past Medical History:  Diagnosis Date  . Acute MI ([place]) 1999; 2007  . Anemia    hx  . Anginal pain ([place])   . Anxiety   . ARF (acute renal failure) ([place]) 06/2017  . Arthritis    "generalized" (03/15/2014)  . CAD (coronary artery disease)    MI in 2000 - MI  2007 - treated bare metal stent (no nuclear since then as 9/11)  . Carotid artery disease ([place])   . CHF (congestive heart failure) ([place])   . Chronic diastolic heart failure (H[facility])    a) ECHO (08/2013) EF 55-60% and RV function nl b) RHC (08/2013) RA 4, RV 30/5/7, PA 25/10 (16), PCWP 7, Fick CO/CI 6.3/2.7, PVR 1.5 WU, PA 61 and 66%  . Daily headache    "~ every other day; since I fell in June" (03/15/2014)  . Depression   . Dyslipidemia   . Exertional shortness of breath   . HTN (hypertension)   . Hypothyroidism   . Obesity   . Osteoarthritis   . Peripheral neuropathy   . PONV (postoperative nausea and vomiting)   . RBBB (right bundle branch block)    Old  . Renal insufficiency    DENIES  . Stroke [facility])    mini strokes  . Syncope    likely due to low blood sugar  . Tachycardia    Sinus tachycardia  . Type II diabetes mellitus ([place])   . Urinary incontinence   . Venous insufficiency     Patient Active Problem List   Diagnosis Date Noted  . Acute on chronic diastolic  (congestive) heart failure ([place]) 02/13/2018  . PVD (peripheral vascular disease) ([place]) 10/26/2017  . Hypothyroid 07/27/2017  . PAH (pulmonary artery hypertension) ([place])   . Impaired ambulation 07/19/2017  . Hyperosmolar non-ketotic state in patient with type 2 diabetes mellitus ([place]) 07/15/2017  . Leg cramps 02/27/2017  . Peripheral edema 01/12/2017  . Diabetic neuropathy ([place]) 11/12/2016  . 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 mellitus ([place]) 10/24/2015  . Anemia 10/03/2015  . Generalized anxiety disorder 10/03/2015  . Insomnia 10/03/2015  . Diabetes mellitus type II, uncontrolled ([place]) 06/07/2015  . Chronic diastolic CHF (congestive heart failure) ([place]) 06/07/2015  . Non compliance with medical treatment 04/17/2014  . Rotator cuff tear 03/14/2014  . Morbid obesity ([place]) 09/23/2013  . Urinary incontinence   . MDD (major depressive disorder) 11/12/2010  . RBBB (right bundle branch block)   . CAD (coronary artery disease)   . Hyperlipemia 01/22/2009  . Essential hypertension 01/22/2009    Past Surgical History:  Procedure Laterality Date  . ABDOMINAL HYSTERECTOMY  1980's  . CATARACT EXTRACTION, BILATERAL Bilateral ?2013  . CORONARY ANGIOPLASTY WITH STENT PLACEMENT  1999; 2007   "1 + 1"  . KNEE ARTHROSCOPY  Left 10/25/2006  . RIGHT HEART CATH N/A 07/24/2017   Procedure: RIGHT HEART CATH;  Surgeon: Jolaine Artist, MD;  Location: [facility] CV LAB;  Service: Cardiovascular;  Laterality: N/A;  . RIGHT HEART CATHETERIZATION N/A 09/22/2013   Procedure: RIGHT HEART CATH;  Surgeon: Jolaine Artist, MD;  Location: [facility] CATH LAB;  Service: Cardiovascular;  Laterality: N/A;  . SHOULDER ARTHROSCOPY W/ ROTATOR CUFF REPAIR Right 03/14/2014  . SHOULDER ARTHROSCOPY WITH OPEN ROTATOR CUFF REPAIR Right 03/14/2014   Procedure: RIGHT SHOULDER ARTHROSCOPY WITH BICEPS RELEASE, OPEN SUBSCAPULA REPAIR, OPEN SUPRASPINATUS REPAIR.;  Surgeon: Meredith Pel, MD;  Location: [facility];  Service:  Orthopedics;  Laterality: Right;  . TUBAL LIGATION  1970's     OB History   None      Home Medications    Prior to Admission medications   Medication Sig Start Date End Date Taking? Authorizing Provider  albuterol (PROVENTIL HFA;VENTOLIN HFA) 108 (90 BASE) MCG/ACT inhaler Inhale 2 puffs into the lungs every 6 (six) hours as needed for wheezing or shortness of breath. Reported on 10/30/2015   Yes [provider]  aspirin EC 81 MG tablet Take 81 mg by mouth daily.   Yes [provider]  carvedilol (COREG) 12.5 MG tablet TAKE 1 TABLET BY MOUTH TWICE A DAY WITH A MEAL Patient taking differently: Take 12.5 mg by mouth 2 (two) times daily with a meal.  09/30/17  Yes [place], [name], MD  cholecalciferol (VITAMIN D) 1000 units tablet Take 1,000 Units by mouth daily.   Yes [provider]  clopidogrel (PLAVIX) 75 MG tablet TAKE 1 TABLET BY MOUTH DAILY WITH BREAKFAST. Patient taking differently: Take 75 mg by mouth daily.  11/23/17  Yes Larey Dresser, MD  escitalopram (LEXAPRO) 20 MG tablet Take 1 tablet (20 mg total) by mouth daily. 10/26/17  Yes [place], [name], MD  ferrous sulfate 325 (65 FE) MG tablet Take 325 mg by mouth daily with breakfast. Reported on 08/21/2015   Yes [provider]  insulin regular (NOVOLIN R,HUMULIN R) 100 units/mL injection 3 (three) times daily before meals.   Yes [provider]  isosorbide dinitrate (ISORDIL) 10 MG tablet TAKE 1 TABLET BY MOUTH TWICE A DAY Patient taking differently: Take 10 mg by mouth 2 (two) times daily.  08/17/17  Yes Bensimhon, Shaune Pascal, MD  levothyroxine (SYNTHROID, LEVOTHROID) 50 MCG tablet Take 1 tablet (50 mcg total) by mouth daily before breakfast. 11/07/16  Yes Dena Billet B, PA-C  losartan (COZAAR) 50 MG tablet Take 1 tablet (50 mg total) by mouth daily. 02/12/18  Yes [place], [name], MD  Magnesium Oxide 400 (240 Mg) MG TABS TAKE 1 TABLET BY MOUTH EVERY DAY Patient taking differently: TAKE 240 mg  TABLET BY MOUTH EVERY DAY 06/23/17  Yes [name], MD  metolazone (ZAROXOLYN) 5 MG tablet Take 1 tablet (5 mg total) by mouth once a week. 09/25/17  Yes [place], [name], MD  nitroGLYCERIN (NITROSTAT) 0.4 MG SL tablet PLACE 1 TABLET (0.4 MG TOTAL) UNDER THE TONGUE EVERY 5 (FIVE) MINUTES AS NEEDED FOR CHEST PAIN. 08/28/16  Yes Dena Billet B, PA-C  ondansetron (ZOFRAN) 4 MG tablet Take 1 tablet (4 mg total) by mouth every 8 (eight) hours as needed for nausea or vomiting. 01/13/18  Yes [place], [name], MD  pregabalin (LYRICA) 150 MG capsule Take 1 capsule (150 mg total) by mouth 2 (two) times daily. 11/12/16  Yes [place], [name], MD  rosuvastatin (Susan Moore)  20 MG tablet TAKE 1 TABLET BY MOUTH EVERY DAY Patient taking differently: Take 20 mg by mouth daily.  12/30/17  Yes [place], [name], MD  torsemide (DEMADEX) 20 MG tablet Take 80 mg by mouth 2 (two) times daily.   Yes [provider]  vitamin E (VITAMIN E) 1000 UNIT capsule Take 1,000 Units by mouth daily.   Yes [provider]  [facility] VERIO test strip  11/02/17   [provider]    Family History Family History  Problem Relation Age of Onset  . Heart attack Mother 57    Social History Social History   Tobacco Use  . Smoking status: Former Smoker    Packs/day: 3.00    Years: 32.00    Pack years: 96.00    Types: Cigarettes    Last attempt to quit: 10/24/1997    Years since quitting: 20.3  . Smokeless tobacco: Never Used  Substance Use Topics  . Alcohol use: Yes    Comment: "might have 2-3 daiquiris in the summer"  . Drug use: No     Allergies   Codeine   Review of Systems Review of Systems  Constitutional: Negative for fever.  HENT: Negative for sore throat.   Eyes: Negative for visual disturbance.  Respiratory: Positive for shortness of breath. Negative for cough.   Cardiovascular: Positive for chest pain and leg swelling.  Gastrointestinal: Positive for nausea. Negative for abdominal  pain.  Genitourinary: Negative for difficulty urinating.  Musculoskeletal: Negative for back pain and neck pain.  Skin: Negative for rash.  Neurological: Negative for syncope and headaches.     Physical Exam Updated Vital Signs BP (!) 163/75 (BP Location: Left Arm)   Pulse 85   Temp 98.2 F (36.8 C) (Oral)   Resp 18   Ht 5\' 5"  (1.651 m)   Wt 127 kg   SpO2 95%   BMI 46.59 kg/m   Physical Exam  Constitutional: She is oriented to person, place, and time. She appears well-developed and well-nourished. No distress.  HENT:  Head: Normocephalic and atraumatic.  Eyes: Conjunctivae and EOM are normal.  Neck: Normal range of motion. JVD present.  Cardiovascular: Normal rate, regular rhythm, normal heart sounds and intact distal pulses. Exam reveals no gallop and no friction rub.  No murmur heard. Pulmonary/Chest: Effort normal. No respiratory distress. She has no wheezes. She has rales in the left lower field.  Abdominal: Soft. She exhibits no distension. There is no tenderness. There is no guarding.  Musculoskeletal: She exhibits no tenderness.       Right lower leg: She exhibits edema.       Left lower leg: She exhibits edema.  Erythema bilateral lower ext. Ulcer between first nad second toe   Neurological: She is alert and oriented to person, place, and time.  Skin: Skin is warm and dry. No rash noted. She is not diaphoretic. No erythema.  Nursing note and vitals reviewed.    ED Treatments / Results  Labs (all labs ordered are listed, but only abnormal results are displayed) Labs Reviewed  BASIC METABOLIC PANEL - Abnormal; Notable for the following components:      Result Value   Glucose, Bld 498 (*)    BUN 45 (*)    Creatinine, Ser 2.00 (*)    Calcium 8.4 (*)    GFR calc non Af Amer 24 (*)    GFR calc Af Amer 28 (*)    All other components within normal limits  CBC -  Abnormal; Notable for the following components:   Hemoglobin 9.7 (*)    HCT 33.3 (*)    MCH 24.3  (*)    MCHC 29.1 (*)    RDW 16.2 (*)    All other components within normal limits  BRAIN NATRIURETIC PEPTIDE - Abnormal; Notable for the following components:   B Natriuretic Peptide 728.7 (*)    All other components within normal limits  GLUCOSE, CAPILLARY - Abnormal; Notable for the following components:   Glucose-Capillary 343 (*)    All other components within normal limits  CBG MONITORING, ED - Abnormal; Notable for the following components:   Glucose-Capillary 412 (*)    All other components within normal limits  MRSA PCR SCREENING  HIV ANTIBODY (ROUTINE TESTING W REFLEX)  CBC WITH DIFFERENTIAL/PLATELET  COMPREHENSIVE METABOLIC PANEL  I-STAT TROPONIN, ED    EKG EKG Interpretation  Date/Time:  Saturday February 13 2018 12:36:57 EDT Ventricular Rate:  94 PR Interval:  152 QRS Duration: 106 QT Interval:  372 QTC Calculation: 465 R Axis:   -52 Text Interpretation:  Normal sinus rhythm Left axis deviation Low voltage QRS Incomplete right bundle branch block Possible Anterolateral infarct , age undetermined Abnormal ECG No significant change since last tracing Confirmed by Gareth Morgan [phone]) on 02/13/2018 3:15:04 PM   Radiology Dg Chest Portable 1 View  Result Date: 02/13/2018 CLINICAL DATA:  Encounter for chest pain and SOB that began earlier today. Pain is on the left side, pt states normally it is intermittent but has been constant today. EXAM: PORTABLE CHEST 1 VIEW COMPARISON:  07/16/2017 FINDINGS: Cardiac silhouette is normal in size. No mediastinal or hilar masses. There are prominent bronchovascular markings most evident in the lower lungs. No convincing pneumonia or pulmonary edema. No pleural effusion or pneumothorax. Skeletal structures are grossly intact. IMPRESSION: No acute cardiopulmonary disease. Electronically Signed   By: Lajean Manes M.D.   On: 02/13/2018 14:09   Dg Foot Complete Right  Result Date: 02/13/2018 CLINICAL DATA:  Ulcer at great toe EXAM:  RIGHT FOOT COMPLETE - 3+ VIEW COMPARISON:  10/01/2012 FINDINGS: No visible bone destruction within the great toe to suggest osteomyelitis. No fracture, subluxation or dislocation. Advanced degenerative changes in the midfoot and hindfoot. Large calcaneal spurs. Vascular calcifications. IMPRESSION: No acute bony abnormality. Note radiographic changes of osteomyelitis. Electronically Signed   By: Rolm Baptise M.D.   On: 02/13/2018 17:27    Procedures Procedures (including critical care time)  Medications Ordered in ED Medications  sodium chloride flush (NS) 0.9 % injection 3 mL (3 mLs Intravenous Given 02/13/18 2224)  sodium chloride flush (NS) 0.9 % injection 3 mL (has no administration in time range)  0.9 %  sodium chloride infusion (has no administration in time range)  acetaminophen (TYLENOL) tablet 650 mg (has no administration in time range)  ondansetron (ZOFRAN) injection 4 mg (has no administration in time range)  aspirin EC tablet 81 mg (has no administration in time range)  albuterol (PROVENTIL) (2.5 MG/3ML) 0.083% nebulizer solution 2.5 mg (has no administration in time range)  ferrous sulfate tablet 325 mg (has no administration in time range)  nitroGLYCERIN (NITROSTAT) SL tablet 0.4 mg (has no administration in time range)  levothyroxine (SYNTHROID, LEVOTHROID) tablet 50 mcg (has no administration in time range)  pregabalin (LYRICA) capsule 150 mg (150 mg Oral Given 02/13/18 2222)  magnesium oxide (MAG-OX) tablet 400 mg (400 mg Oral Given 02/13/18 2222)  vitamin E capsule 1,000 Units (1,000 Units Oral Given 02/13/18 2222)  cholecalciferol (VITAMIN D) tablet 1,000 Units (1,000 Units Oral Given 02/13/18 2225)  isosorbide dinitrate (ISORDIL) tablet 10 mg (10 mg Oral Given 02/13/18 2222)  metolazone (ZAROXOLYN) tablet 5 mg (has no administration in time range)  carvedilol (COREG) tablet 12.5 mg (has no administration in time range)  escitalopram (LEXAPRO) tablet 20 mg (has no administration  in time range)  clopidogrel (PLAVIX) tablet 75 mg (has no administration in time range)  rosuvastatin (CRESTOR) tablet 20 mg (has no administration in time range)  ondansetron (ZOFRAN) tablet 4 mg (has no administration in time range)  torsemide (DEMADEX) tablet 80 mg (has no administration in time range)  losartan (COZAAR) tablet 50 mg (has no administration in time range)  heparin injection 5,000 Units (5,000 Units Subcutaneous Given 02/13/18 2222)  furosemide (LASIX) injection 40 mg (has no administration in time range)  insulin aspart (novoLOG) injection 0-15 Units (has no administration in time range)  insulin aspart (novoLOG) injection 0-5 Units (4 Units Subcutaneous Given 02/13/18 2223)  MEDLINE mouth rinse (15 mLs Mouth Rinse Given 02/13/18 2338)  furosemide (LASIX) injection 60 mg (60 mg Intravenous Given 02/13/18 1701)  insulin aspart (novoLOG) injection 8 Units (8 Units Intravenous Given 02/13/18 1657)     Initial Impression / Assessment and Plan / ED Course  I have reviewed the triage vital signs and the nursing notes.  Pertinent labs & imaging results that were available during my care of the patient were reviewed by me and considered in my medical decision making (see chart for details).     67yo female with history of DMII, CAD, CHF, dyslipidemia presents with concern for shortness of breath.  Differential diagnosis for dyspnea includes ACS, PE, COPD exacerbation, CHF exacerbation, anemia, pneumonia.  Chest x-ray was done which showed no acute findings. EKG was evaluated by me which showed no acute changes.  BNP was 700s.  Troponin negative.    Concern given orthopnea, edema, JVD, slowly increasing dyspnea as well as elevated BNP, for congestive heart failure exacerbation.  Patient also with hyperglycemia, without signs of DKA.  Given insulin, lasix. At this time PE is lower on differential with higher suspicion for CHF and will admit for diuresis and continued care.   Final  Clinical Impressions(s) / ED Diagnoses   Final diagnoses:  Acute on chronic congestive heart failure, unspecified heart failure type [facility])  Hyperglycemia    ED Discharge Orders    None       Gareth Morgan, MD 02/14/18 [phone]

## 2018-02-14 ENCOUNTER — Observation Stay (HOSPITAL_BASED_OUTPATIENT_CLINIC_OR_DEPARTMENT_OTHER): Payer: PPO

## 2018-02-14 ENCOUNTER — Observation Stay (HOSPITAL_COMMUNITY): Payer: PPO

## 2018-02-14 DIAGNOSIS — I5033 Acute on chronic diastolic (congestive) heart failure: Secondary | ICD-10-CM

## 2018-02-14 DIAGNOSIS — R0602 Shortness of breath: Secondary | ICD-10-CM | POA: Diagnosis not present

## 2018-02-14 DIAGNOSIS — R079 Chest pain, unspecified: Secondary | ICD-10-CM | POA: Diagnosis not present

## 2018-02-14 LAB — HIV ANTIBODY (ROUTINE TESTING W REFLEX): HIV Screen 4th Generation wRfx: NONREACTIVE

## 2018-02-14 LAB — COMPREHENSIVE METABOLIC PANEL
ALT: 23 U/L (ref 0–44)
AST: 15 U/L (ref 15–41)
Albumin: 2.6 g/dL — ABNORMAL LOW (ref 3.5–5.0)
Alkaline Phosphatase: 188 U/L — ABNORMAL HIGH (ref 38–126)
Anion gap: 6 (ref 5–15)
BUN: 44 mg/dL — ABNORMAL HIGH (ref 8–23)
CO2: 27 mmol/L (ref 22–32)
Calcium: 8.5 mg/dL — ABNORMAL LOW (ref 8.9–10.3)
Chloride: 107 mmol/L (ref 98–111)
Creatinine, Ser: 1.81 mg/dL — ABNORMAL HIGH (ref 0.44–1.00)
GFR calc Af Amer: 32 mL/min — ABNORMAL LOW (ref >60)
GFR calc non Af Amer: 28 mL/min — ABNORMAL LOW (ref >60)
Glucose, Bld: 307 mg/dL — ABNORMAL HIGH (ref 70–99)
Potassium: 4 mmol/L (ref 3.5–5.1)
Sodium: 140 mmol/L (ref 135–145)
Total Bilirubin: 0.8 mg/dL (ref 0.3–1.2)
Total Protein: 5.6 g/dL — ABNORMAL LOW (ref 6.5–8.1)

## 2018-02-14 LAB — IRON AND TIBC
Iron: 20 ug/dL — ABNORMAL LOW (ref 28–170)
Saturation Ratios: 8 % — ABNORMAL LOW (ref 10.4–31.8)
TIBC: 241 ug/dL — ABNORMAL LOW (ref 250–450)
UIBC: 221 ug/dL

## 2018-02-14 LAB — CBC WITH DIFFERENTIAL/PLATELET
Abs Immature Granulocytes: 0 10*3/uL (ref 0.0–0.1)
Basophils Absolute: 0 10*3/uL (ref 0.0–0.1)
Basophils Relative: 0 %
Eosinophils Absolute: 0.1 10*3/uL (ref 0.0–0.7)
Eosinophils Relative: 1 %
HCT: 28.9 % — ABNORMAL LOW (ref 36.0–46.0)
Hemoglobin: 8.6 g/dL — ABNORMAL LOW (ref 12.0–15.0)
Immature Granulocytes: 0 %
Lymphocytes Relative: 13 %
Lymphs Abs: 0.8 10*3/uL (ref 0.7–4.0)
MCH: 24.2 pg — ABNORMAL LOW (ref 26.0–34.0)
MCHC: 29.8 g/dL — ABNORMAL LOW (ref 30.0–36.0)
MCV: 81.2 fL (ref 78.0–100.0)
Monocytes Absolute: 0.5 10*3/uL (ref 0.1–1.0)
Monocytes Relative: 8 %
Neutro Abs: 4.8 10*3/uL (ref 1.7–7.7)
Neutrophils Relative %: 78 %
Platelets: 206 10*3/uL (ref 150–400)
RBC: 3.56 MIL/uL — ABNORMAL LOW (ref 3.87–5.11)
RDW: 16.5 % — ABNORMAL HIGH (ref 11.5–15.5)
WBC: 6.2 10*3/uL (ref 4.0–10.5)

## 2018-02-14 LAB — GLUCOSE, CAPILLARY
Glucose-Capillary: 281 mg/dL — ABNORMAL HIGH (ref 70–99)
Glucose-Capillary: 182 mg/dL — ABNORMAL HIGH (ref 70–99)

## 2018-02-14 LAB — FOLATE: Folate: 18.9 ng/mL (ref >5.9)

## 2018-02-14 LAB — VITAMIN B12: Vitamin B-12: 234 pg/mL (ref 180–914)

## 2018-02-14 LAB — TSH: TSH: 0.964 u[IU]/mL (ref 0.350–4.500)

## 2018-02-14 LAB — RETICULOCYTES
RBC.: 0.69 MIL/uL — ABNORMAL LOW (ref 3.87–5.11)
Retic Count, Absolute: 13.8 10*3/uL — ABNORMAL LOW (ref 19.0–186.0)
Retic Ct Pct: 2 % (ref 0.4–3.1)

## 2018-02-14 LAB — ECHOCARDIOGRAM COMPLETE
Height: 65 in
Weight: 4585.57 oz

## 2018-02-14 LAB — FERRITIN: Ferritin: 47 ng/mL (ref 11–307)

## 2018-02-14 MED ORDER — TECHNETIUM TO 99M ALBUMIN AGGREGATED
4.2700 | Freq: Once | INTRAVENOUS | Status: AC | PRN
  Administered 2018-02-14: 4.27 via INTRAVENOUS

## 2018-02-14 MED ORDER — TECHNETIUM TC 99M DIETHYLENETRIAME-PENTAACETIC ACID
31.0000 | Freq: Once | INTRAVENOUS | Status: AC | PRN
  Administered 2018-02-14: 31 via RESPIRATORY_TRACT

## 2018-02-14 NOTE — Progress Notes (Signed)
Echocardiogram 2D Echocardiogram has been performed.  Susan [name] 02/14/2018, 3:34 PM

## 2018-02-14 NOTE — H&P (Addendum)
History and Physical   Rashelle Ireland PTW:[id number] DOB: 1951/05/21 DOA: 02/13/2018  Referring MD/NP/PA: Dr. Billy Fischer  PCP: Alycia Rossetti, MD   Outpatient Specialists: Dr. Haroldine Laws, Cardiology  Patient coming from: Home  Chief Complaint: Chest pain or shortness of breath  HPI: Susan [name] is a 67 y.o. female with a history of diastolic heart failure, diabetes with neuropathy, pulmonary hypertension, morbid obesity, CAD status post MI in 1999 and in 2007, right bundle branch block, CKD, hypothyroidism, major depressive disorder, recurrent anginal pain, chronic lower extremity edema medication noncompliance, presenting to the emergency department with chest pain and shortness of breath, more on exertion and at rest.  Patient denies any recent changes to her medication, and she reports being compliant to them.  She denies any recent food indiscretions.  She reports orthopnea, and PND.  The patient initially was admitted for possible work-up for PE, but CT angiogram cannot be performed, due to GFR being elevated.  Her BNP is in the 700s.  Acute exacerbation of congestive heart failure is suspected.  Of note, the patient reports having gained 15 pounds over the last week, currently at 130 kg.  ED Course: sat 90% room air.  She has a white count of 8.7 hemoglobin 9.7 and platelets of 227.  Sodium is 136 potassium 4.7 chloride 103 with CO2 22 BUN 45 and creatinine 2.0.  Calcium is 8.4.  Glucose is 498.  Chest x-ray showed no acute cardiopulmonary disease.  EKG showed known significant new change.  BNP is 728 Initial troponin is negative.  She received a dose of Lasix 40 mg IV, and twice daily, with good diuresis.  She was last seen by cardiology on 02/03/2018, at which time there were no changes in her medication.  Last 2D echo was in February 2019, showing EF 55 to 60%, with grade 1 diastolic.  She is NYHA 3B.  V/Q scan is pending.  Review of Systems: As per HPI otherwise 10 point review of  systems negative.    Past Medical History:  Diagnosis Date  . Acute MI ([place]) 1999; 2007  . Anemia    hx  . Anginal pain ([place])   . Anxiety   . ARF (acute renal failure) ([place]) 06/2017  . Arthritis    "generalized" (03/15/2014)  . CAD (coronary artery disease)    MI in 2000 - MI  2007 - treated bare metal stent (no nuclear since then as 9/11)  . Carotid artery disease ([place])   . CHF (congestive heart failure) ([place])   . Chronic diastolic heart failure (HCC)    a) ECHO (08/2013) EF 55-60% and RV function nl b) RHC (08/2013) RA 4, RV 30/5/7, PA 25/10 (16), PCWP 7, Fick CO/CI 6.3/2.7, PVR 1.5 WU, PA 61 and 66%  . Daily headache    "~ every other day; since I fell in June" (03/15/2014)  . Depression   . Dyslipidemia   . Exertional shortness of breath   . HTN (hypertension)   . Hypothyroidism   . Obesity   . Osteoarthritis   . Peripheral neuropathy   . PONV (postoperative nausea and vomiting)   . RBBB (right bundle branch block)    Old  . Renal insufficiency    DENIES  . Stroke [facility])    mini strokes  . Syncope    likely due to low blood sugar  . Tachycardia    Sinus tachycardia  . Type II diabetes mellitus ([place])   . Urinary incontinence   .  Venous insufficiency     Past Surgical History:  Procedure Laterality Date  . ABDOMINAL HYSTERECTOMY  1980's  . CATARACT EXTRACTION, BILATERAL Bilateral ?2013  . CORONARY ANGIOPLASTY WITH STENT PLACEMENT  1999; 2007   "1 + 1"  . KNEE ARTHROSCOPY Left 10/25/2006  . RIGHT HEART CATH N/A 07/24/2017   Procedure: RIGHT HEART CATH;  Surgeon: Jolaine Artist, MD;  Location: [place] CV LAB;  Service: Cardiovascular;  Laterality: N/A;  . RIGHT HEART CATHETERIZATION N/A 09/22/2013   Procedure: RIGHT HEART CATH;  Surgeon: Jolaine Artist, MD;  Location: [facility] CATH LAB;  Service: Cardiovascular;  Laterality: N/A;  . SHOULDER ARTHROSCOPY W/ ROTATOR CUFF REPAIR Right 03/14/2014  . SHOULDER ARTHROSCOPY WITH OPEN ROTATOR CUFF REPAIR Right  03/14/2014   Procedure: RIGHT SHOULDER ARTHROSCOPY WITH BICEPS RELEASE, OPEN SUBSCAPULA REPAIR, OPEN SUPRASPINATUS REPAIR.;  Surgeon: Meredith Pel, MD;  Location: [place];  Service: Orthopedics;  Laterality: Right;  . TUBAL LIGATION  1970's     reports that she quit smoking about 20 years ago. Her smoking use included cigarettes. She has a 96.00 pack-year smoking history. She has never used smokeless tobacco. She reports that she drinks alcohol. She reports that she does not use drugs.  Allergies  Allergen Reactions  . Codeine Nausea And Vomiting    Family History  Problem Relation Age of Onset  . Heart attack Mother 59     Prior to Admission medications   Medication Sig Start Date End Date Taking? Authorizing Provider  albuterol (PROVENTIL HFA;VENTOLIN HFA) 108 (90 BASE) MCG/ACT inhaler Inhale 2 puffs into the lungs every 6 (six) hours as needed for wheezing or shortness of breath. Reported on 10/30/2015   Yes [provider]  aspirin EC 81 MG tablet Take 81 mg by mouth daily.   Yes [provider]  carvedilol (COREG) 12.5 MG tablet TAKE 1 TABLET BY MOUTH TWICE A DAY WITH A MEAL Patient taking differently: Take 12.5 mg by mouth 2 (two) times daily with a meal.  09/30/17  Yes [place], [name], MD  cholecalciferol (VITAMIN D) 1000 units tablet Take 1,000 Units by mouth daily.   Yes [provider]  clopidogrel (PLAVIX) 75 MG tablet TAKE 1 TABLET BY MOUTH DAILY WITH BREAKFAST. Patient taking differently: Take 75 mg by mouth daily.  11/23/17  Yes Larey Dresser, MD  escitalopram (LEXAPRO) 20 MG tablet Take 1 tablet (20 mg total) by mouth daily. 10/26/17  Yes [place], [name], MD  ferrous sulfate 325 (65 FE) MG tablet Take 325 mg by mouth daily with breakfast. Reported on 08/21/2015   Yes [provider]  insulin regular (NOVOLIN R,HUMULIN R) 100 units/mL injection 3 (three) times daily before meals.   Yes [provider]  isosorbide dinitrate  (ISORDIL) 10 MG tablet TAKE 1 TABLET BY MOUTH TWICE A DAY Patient taking differently: Take 10 mg by mouth 2 (two) times daily.  08/17/17  Yes [place], Shaune Pascal, MD  levothyroxine (SYNTHROID, LEVOTHROID) 50 MCG tablet Take 1 tablet (50 mcg total) by mouth daily before breakfast. 11/07/16  Yes Dena Billet B, PA-C  losartan (COZAAR) 50 MG tablet Take 1 tablet (50 mg total) by mouth daily. 02/12/18  Yes [place], [name], MD  Magnesium Oxide 400 (240 Mg) MG TABS TAKE 1 TABLET BY MOUTH EVERY DAY Patient taking differently: TAKE 240 mg TABLET BY MOUTH EVERY DAY 06/23/17  Yes [name], MD  metolazone (ZAROXOLYN) 5 MG tablet Take 1 tablet (5 mg total)  by mouth once a week. 09/25/17  Yes [place], [name], MD  nitroGLYCERIN (NITROSTAT) 0.4 MG SL tablet PLACE 1 TABLET (0.4 MG TOTAL) UNDER THE TONGUE EVERY 5 (FIVE) MINUTES AS NEEDED FOR CHEST PAIN. 08/28/16  Yes Dena Billet B, PA-C  ondansetron (ZOFRAN) 4 MG tablet Take 1 tablet (4 mg total) by mouth every 8 (eight) hours as needed for nausea or vomiting. 01/13/18  Yes [place], [name], MD  pregabalin (LYRICA) 150 MG capsule Take 1 capsule (150 mg total) by mouth 2 (two) times daily. 11/12/16  Yes [place], [name], MD  rosuvastatin (CRESTOR) 20 MG tablet TAKE 1 TABLET BY MOUTH EVERY DAY Patient taking differently: Take 20 mg by mouth daily.  12/30/17  Yes [place], [name], MD  torsemide (DEMADEX) 20 MG tablet Take 80 mg by mouth 2 (two) times daily.   Yes [provider]  vitamin E (VITAMIN E) 1000 UNIT capsule Take 1,000 Units by mouth daily.   Yes [provider]  [facility] VERIO test strip  11/02/17   [provider]    Physical Exam: Vitals:   02/13/18 1915 02/13/18 1930 02/13/18 2013 02/14/18 0534  BP: 138/75 139/74 (!) 163/75 136/65  Pulse: 84 85 85 72  Resp: 15 15 18 18   Temp:   98.2 F (36.8 C) 99.2 F (37.3 C)  TempSrc:   Oral Oral  SpO2: 100% 99% 95% 98%  Weight:    130 kg  Height:          Constitutional:  NAD, somewhat lethargic, morbidly obese, able to answer questions. Vitals:   02/13/18 1915 02/13/18 1930 02/13/18 2013 02/14/18 0534  BP: 138/75 139/74 (!) 163/75 136/65  Pulse: 84 85 85 72  Resp: 15 15 18 18   Temp:   98.2 F (36.8 C) 99.2 F (37.3 C)  TempSrc:   Oral Oral  SpO2: 100% 99% 95% 98%  Weight:    130 kg  Height:        Eyes: PERRL, lids and conjunctivae normal ENMT: Mucous membranes are moist. Posterior pharynx clear of any exudate or lesions.Normal dentition.  Neck: normal, supple, no masses, no thyromegaly Respiratory: She has mild bilateral crackles at the bases, no wheezing or rhonchi, normal respiratory effort. No accessory muscle use.  Cardiovascular: Regular rate and rhythm, no murmurs / rubs / gallops.  1+ lower extremity edema, with venous stasis changes noted in her legs 2+ pedal pulses. No carotid bruits.  Abdomen: no tenderness, obese, no masses palpated. No hepatosplenomegaly. Bowel sounds positive.  Musculoskeletal: no clubbing / cyanosis. No joint deformity upper and lower extremities. Good ROM, no contractures. Normal muscle tone.  Skin: no rashes, lesions, ulcers. No induration.  Bilateral venous stasis changes.  No cellulitis is noted Neurologic: CN 2-12 grossly intact. Sensation intact, DTR normal. Strength 5/5 in all 4.  Psychiatric: Normal judgment and insight. Alert and oriented x 3.  Somewhat lethargic mood    Labs on Admission: I have personally reviewed following labs and imaging studies  CBC: Recent Labs  Lab 02/13/18 1246 02/14/18 0517  WBC 8.7 6.2  NEUTROABS  --  4.8  HGB 9.7* 8.6*  HCT 33.3* 28.9*  MCV 83.3 81.2  PLT 227 831   Basic Metabolic Panel: Recent Labs  Lab 02/13/18 1246 02/14/18 0517  NA 136 140  K 4.7 4.0  CL 103 107  CO2 22 27  GLUCOSE 498* 307*  BUN 45* 44*  CREATININE 2.00* 1.81*  CALCIUM 8.4* 8.5*   GFR: Estimated  Creatinine Clearance: 40.5 mL/min (A) (by C-G formula based on SCr of 1.81 mg/dL  (H)). Liver Function Tests: Recent Labs  Lab 02/14/18 0517  AST 15  ALT 23  ALKPHOS 188*  BILITOT 0.8  PROT 5.6*  ALBUMIN 2.6*   No results for input(s): LIPASE, AMYLASE in the last 168 hours. No results for input(s): AMMONIA in the last 168 hours. Coagulation Profile: No results for input(s): INR, PROTIME in the last 168 hours. Cardiac Enzymes: No results for input(s): CKTOTAL, CKMB, CKMBINDEX, TROPONINI in the last 168 hours. BNP (last 3 results) No results for input(s): PROBNP in the last 8760 hours. HbA1C: No results for input(s): HGBA1C in the last 72 hours. CBG: Recent Labs  Lab 02/13/18 1628 02/13/18 2036 02/14/18 0725  GLUCAP 412* 343* 281*   Lipid Profile: No results for input(s): CHOL, HDL, LDLCALC, TRIG, CHOLHDL, LDLDIRECT in the last 72 hours. Thyroid Function Tests: No results for input(s): TSH, T4TOTAL, FREET4, T3FREE, THYROIDAB in the last 72 hours. Anemia Panel: No results for input(s): VITAMINB12, FOLATE, FERRITIN, TIBC, IRON, RETICCTPCT in the last 72 hours. Urine analysis:    Component Value Date/Time   COLORURINE YELLOW 07/22/2017 2053   APPEARANCEUR CLOUDY (A) 07/22/2017 2053   LABSPEC 1.012 07/22/2017 2053   PHURINE 5.0 07/22/2017 2053   GLUCOSEU NEGATIVE 07/22/2017 2053   HGBUR SMALL (A) 07/22/2017 2053   BILIRUBINUR NEGATIVE 07/22/2017 2053   KETONESUR NEGATIVE 07/22/2017 2053   PROTEINUR 30 (A) 07/22/2017 2053   UROBILINOGEN 0.2 04/01/2014 1659   NITRITE NEGATIVE 07/22/2017 2053   LEUKOCYTESUR LARGE (A) 07/22/2017 2053   Sepsis Labs: @LABRCNTIP (procalcitonin:4,lacticidven:4) ) Recent Results (from the past 240 hour(s))  MRSA PCR Screening     Status: None   Collection Time: 02/13/18  8:32 PM  Result Value Ref Range Status   MRSA by PCR NEGATIVE NEGATIVE Final    Comment:        The GeneXpert MRSA Assay (FDA approved for NASAL specimens only), is one component of a comprehensive MRSA colonization surveillance program. It is  not intended to diagnose MRSA infection nor to guide or monitor treatment for MRSA infections. Performed at [facility], [place] [address], [place], [place] [address]      Radiological Exams on Admission: Dg Chest Portable 1 View  Result Date: 02/13/2018 CLINICAL DATA:  Encounter for chest pain and SOB that began earlier today. Pain is on the left side, pt states normally it is intermittent but has been constant today. EXAM: PORTABLE CHEST 1 VIEW COMPARISON:  07/16/2017 FINDINGS: Cardiac silhouette is normal in size. No mediastinal or hilar masses. There are prominent bronchovascular markings most evident in the lower lungs. No convincing pneumonia or pulmonary edema. No pleural effusion or pneumothorax. Skeletal structures are grossly intact. IMPRESSION: No acute cardiopulmonary disease. Electronically Signed   By: Lajean Manes M.D.   On: 02/13/2018 14:09   Dg Foot Complete Right  Result Date: 02/13/2018 CLINICAL DATA:  Ulcer at great toe EXAM: RIGHT FOOT COMPLETE - 3+ VIEW COMPARISON:  10/01/2012 FINDINGS: No visible bone destruction within the great toe to suggest osteomyelitis. No fracture, subluxation or dislocation. Advanced degenerative changes in the midfoot and hindfoot. Large calcaneal spurs. Vascular calcifications. IMPRESSION: No acute bony abnormality. Note radiographic changes of osteomyelitis. Electronically Signed   By: Rolm Baptise M.D.   On: 02/13/2018 17:27    EKG: Independently reviewed.  Shows normal sinus rhythm with a rate of 94, incomplete RBBB, nonspecific ST changes  Assessment/Plan Principal Problem:  Acute on chronic diastolic (congestive) heart failure (HCC) Active Problems:   Hyperlipemia   Essential hypertension   RBBB (right bundle branch block)   MDD (major depressive disorder)   Morbid obesity (HCC)   Non compliance with medical treatment   Diabetes mellitus type II, uncontrolled ([place])   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 mellitus ([place])   PAH  (pulmonary artery hypertension) (HCC)   Hypothyroid    Acute on chronic diastolic CHF,She is NYHA 3B., likely decompensated, due to possible medicine noncompliance.  She reports having gained 15 pounds over the last week.  BNP is 728.  Initial troponin negative.  Last 2D echo in February 2019 shows EF 55 to 60%.  She was diuresed with Lasix 40 mg IV twice daily.  At home she is supposed to be on torsemide Continue Lasix 40 mg IV twice daily along with her home diuretic Obtain daily weights Monitor intake and output 2D echo is pending, if results show worsening EF, will consult cardiology. Continue Coreg, losartan  Acute respiratory failure with hypoxia, sat 90% room air.  She has a white count of 8.7 chloride 103 with CO2 22  Chest x-ray showed no acute cardiopulmonary disease.  VQ scan pending.  The patient does have daytime fatigue, but does not want a sleep study. Oxygen as needed Follow results of VQ scan.  Hypertension BP 136/65 (BP Location: Left Arm)   Pulse 72   Temp 99.2 F (37.3 C) (Oral)   Resp 18   Ht 5\' 5"  (1.651 m)   Wt 130 kg   SpO2 98%   BMI 47.69 kg/m  Continue home anti-hypertensive medications   Hyperlipidemia Continue home statins  CAD, status post MI in 1999, no signs of ischemia.  Last catheterization March 2019.  Last stress test was in September 2018, low risk.  Patient is on statin and aspirin. Continue statin, aspirin, Plavix, follow with cardiology as an outpatient.  Chronic kidney disease stage 4 followed by Dr. Hollie Salk current Cr is 1.81, stable and improved since admission, which time it was 2.0 Lab Results  Component Value Date   CREATININE 1.81 (H) 02/14/2018   CREATININE 2.00 (H) 02/13/2018   CREATININE 2.10 (H) 01/28/2018  Continue to monitor Creatinine, in view of need for diuretics.  BMET in a.m. patient has an appointment next month for follow-up   History of pulmonary hypertension, followed by cardiology.  Last cardiac catheterization  March 2019, confirms mild PAH, with evidence of RV strain, likely due to [place].  No change in her current therapy. Continue torsemide.  Type II Diabetes Current blood sugar level is 281, last hemoglobin A1c was 9.3 in May 2019. Continue sliding scale insulin.  SSI Continue Lyrica  Hypothyroidism: Continue home Synthroid   Anemia of chronic disease Hemoglobin on admission 8.6, was 9.7 yesterday, 10.5 in August. Denies any bleeding issues, however may have a component in her symptoms. Non compliant with Iron supplements Hemoccult Check Iron studies No transfusion is indicated at this time Follow CBC   Resume her home Iron   Depression Continue  Lexapro  Leukocytes in urine, the patient is afebrile, white count is unremarkable.  Urine shows leukocytes, but no nitrates.  Denies dysuria or gross hematuria. Check urine culture  DVT prophylaxis: Heparin for now, may be discontinued if active bleeding is noted. Code Status: Full code Family Communication: Care discussed with the patient.  No family available at bedside. Disposition Plan: To be determined Consults called: None Admission status: Observation  Sharene Butters MD [facility] Pager [phone]  If 7PM-7AM, please contact night-coverage [email or web address] Password [email or web address]  02/14/2018, 10:40 AM

## 2018-02-14 NOTE — Progress Notes (Signed)
New Admission Note:  Arrival Method: Stretcher from the ED with NT Mental Orientation: A&O x4 Telemetry: Box 14, CCMD notified and second verification completed. Assessment: Completed Skin: Assessed with Jenetta Downer, RN. Check flowsheets. IV: 20G right arm, normal saline locked Pain: 0/10 Tubes: N/A Safety Measures: Safety Fall Prevention Plan discussed with patient. Yellow socks, yellow armband, and bed alarm turned on. Admission: Completed Orientation: Patient has been orientated to the room, unit and the staff. Family: None at bedside.  Orders have been reviewed and implemented. Will continue to monitor the patient. Call light has been placed within reach and bed alarm has been activated.   Nena Polio BSN, RN

## 2018-02-14 NOTE — Discharge Summary (Signed)
Physician Discharge Summary  Susan [name] VFI:[id number] DOB: 04/26/1951 DOA: 02/13/2018  PCP: Alycia Rossetti, MD  Admit date: 02/13/2018 Discharge date: 02/14/2018  Admitted From: Home Disposition: Home Recommendations for Outpatient Follow-up:  1. Follow up with PCP this week with CBC and CMP  2. Follow up with Cardiology  3. Follow 2 D echo report  4. Limit fluid intake (currently at 32 oz daily)  5. Follow Urine Culture by PCP   Home Health: No  Equipment/Devices NO  Discharge Condition: Stable guarded  Hospice  CODE STATUS: FULL   Diet recommendation:     Carb modified Heart Healthy   Brief/Interim Summary:   Susan [name] is a 67 y.o. female with a history of diastolic heart failure, diabetes with neuropathy, pulmonary hypertension, morbid obesity, CAD status post MI in 1999 and in 2007, right bundle branch block, CKD, hypothyroidism, major depressive disorder, recurrent anginal pain, chronic lower extremity edema medication noncompliance, presenting to the emergency department with chest pain and shortness of breath, more on exertion and at rest.  Patient denies any recent changes to her medication, and she reports being compliant to them.  She denies any recent food indiscretions but she does admit to heavy water intake .  She reports orthopnea, and PND.  The patient initially was admitted for possible work-up for PE, but CT angiogram cannot be performed, due to GFR being elevated.  Her BNP is in the 700s.  Acute exacerbation of congestive heart failure is suspected.  Of note, the patient reports having gained 15 pounds over the last week, currently at 130 kg.  ED Course: sat 90% room air.  She has a white count of 8.7 hemoglobin 9.7 and platelets of 227.  Iron studies  Remarkable for Iron 20, TIBC 241, Sat 8, Ferritin 47 and Folate 18.9  Sodium is 136 potassium 4.7 chloride 103 with CO2 22 BUN 45 and creatinine 2.0.  Calcium is 8.4.  Glucose is 498.  Chest x-ray showed no acute  cardiopulmonary disease.  EKG showed known significant new change.  BNP is 728 Initial troponin is negative.  She received a dose of Lasix 40 mg IV, and twice daily, with good diuresis.  She was last seen by cardiology on 02/03/2018, at which time there were no changes in her medication.  Last 2D echo was in February 2019, showing EF 55 to 60%, with grade 1 diastolic.  She is NYHA 3B.  V/Q scan is negative for PE.  TSH 0.964  Discharge Diagnoses:  Principal Problem:   Acute on chronic diastolic (congestive) heart failure (HCC) Active Problems:   Hyperlipemia   Essential hypertension   RBBB (right bundle branch block)   MDD (major depressive disorder)   Morbid obesity (HCC)   Non compliance with medical treatment   Diabetes mellitus type II, uncontrolled ([place])   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 mellitus ([place])   PAH (pulmonary artery hypertension) (HCC)   Hypothyroid   Acute on chronic diastolic CHF,She is NYHA 3B., likely decompensated due to heavy water intake  She reports having gained 15 pounds over the last week.  BNP is 728.  Initial troponin negative. VQ neg for PE   Last 2D echo in February 2019 shows EF 55 to 60%.  She was diuresed with Lasix 40 mg and another dose of 60 mg IV   At home she is on torsemide. SHe ambulated the hallway with assistance, without O2 supplement and remain at a normal O sats .  Discharge to home  Follow with Cardiology as OP, patient has appt with CHF clinic on Oct 23  Follow Echo results Decrease oral fluid intake, to prevent fluid overload and future  hospitalizations  Continue Coreg, losartan Follow with PCP within this week with labs for post hospital follow up.  Acute respiratory failure with hypoxia,  due to above  sat 90% room air.  She has a white count of 8.7 chloride 103 with CO2 22  Chest x-ray showed no acute cardiopulmonary disease.  VQ scan neg for PE   The patient does have daytime fatigue, but does not want a sleep study. She ambulated the  hallways without difficulty prior to dc  Oxygen as needed Continue Inhalers as outpatient    Hypertension BP 136/65   Pulse 72    Continue home anti-hypertensive medications   Hyperlipidemia Continue home statins  CAD, status post MI in 1999, no signs of ischemia.  Last catheterization March 2019.  Last stress test was in September 2018, low risk.  Patient is on statin and aspirin. Continue statin, aspirin, Plavix, follow with cardiology as an outpatient. 2 D echo pending at the time of discharge   Chronic kidney disease stage 4 followed by Dr. Hollie Salk current Cr is 1.81, stable and improved since admission, which time it was 2.0 RecentLabs       Lab Results  Component Value Date   CREATININE 1.81 (H) 02/14/2018   CREATININE 2.00 (H) 02/13/2018   CREATININE 2.10 (H) 01/28/2018    Continue to monitor Creatinine as outpatient  She has an appointment next month for follow-up   History of pulmonary hypertension, followed by cardiology.  Last cardiac catheterization March 2019, confirms mild PAH, with evidence of RV strain, likely due to [name].  No change in her current therapy. Continue torsemide.  Type II Diabetes Current blood sugar level is 281, last hemoglobin A1c was 9.3 in May 2019. Continue Novolin tid  Continue Lyrica  Hypothyroidism: Continue home Synthroid TSH is 0.964    Anemia of chronic disease Hemoglobin on admission 8.6, was 9.7 yesterday, 10.5 in  August. Denies any bleeding issues, however may have a component in her symptoms. Non compliant with Iron supplements  Iron studies today   Remarkable for Iron 20, TIBC 241, Sat 8, Ferritin 47 and Folate 18.9  No transfusion is indicated at this time Follow CBC and Iron deficiency as outpatient   Resume her home Iron upon discharge   Depression Continue  Lexapro  Leukocytes in urine, the patient is afebrile, white count is unremarkable.  Urine shows leukocytes, but no nitrates.  Denies dysuria or  gross hematuria. Urine  Culture pending, follow OP results     Discharge Instructions  Discharge Instructions    (HEART FAILURE PATIENTS) Call MD:  Anytime you have any of the following symptoms: 1) 3 pound weight gain in 24 hours or 5 pounds in 1 week 2) shortness of breath, with or without a dry hacking cough 3) swelling in the hands, feet or stomach 4) if you have to sleep on extra pillows at night in order to breathe.   Complete by:  As directed    Call MD for:  difficulty breathing, headache or visual disturbances   Complete by:  As directed    Call MD for:  extreme fatigue   Complete by:  As directed    Call MD for:  hives   Complete by:  As directed    Call MD for:  persistant dizziness  or light-headedness   Complete by:  As directed    Call MD for:  persistant nausea and vomiting   Complete by:  As directed    Call MD for:  redness, tenderness, or signs of infection (pain, swelling, redness, odor or green/yellow discharge around incision site)   Complete by:  As directed    Call MD for:  severe uncontrolled pain   Complete by:  As directed    Call MD for:  temperature >100.4   Complete by:  As directed    Diet - low sodium heart healthy   Complete by:  As directed    Discharge instructions   Complete by:  As directed    Follow up with PCP this week with CBC and CMP  Follow up with Cardiology  Follow 2 D echo report  Limit fluid intake(currently at 32 oz daily)  Follow Urine Culture by PCP   Increase activity slowly   Complete by:  As directed    No wound care   Complete by:  As directed      Allergies as of 02/14/2018      Reactions   Codeine Nausea And Vomiting      Medication List    TAKE these medications   albuterol 108 (90 Base) MCG/ACT inhaler Commonly known as:  PROVENTIL HFA;VENTOLIN HFA Inhale 2 puffs into the lungs every 6 (six) hours as needed for wheezing or shortness of breath. Reported on 10/30/2015   aspirin EC 81 MG tablet Take 81 mg by  mouth daily.   carvedilol 12.5 MG tablet Commonly known as:  COREG TAKE 1 TABLET BY MOUTH TWICE A DAY WITH A MEAL What changed:  See the new instructions.   cholecalciferol 1000 units tablet Commonly known as:  VITAMIN D Take 1,000 Units by mouth daily.   clopidogrel 75 MG tablet Commonly known as:  PLAVIX TAKE 1 TABLET BY MOUTH DAILY WITH BREAKFAST. What changed:  when to take this   escitalopram 20 MG tablet Commonly known as:  LEXAPRO Take 1 tablet (20 mg total) by mouth daily.   ferrous sulfate 325 (65 FE) MG tablet Take 325 mg by mouth daily with breakfast. Reported on 08/21/2015   insulin regular 100 units/mL injection Commonly known as:  NOVOLIN R,HUMULIN R 3 (three) times daily before meals.   isosorbide dinitrate 10 MG tablet Commonly known as:  ISORDIL TAKE 1 TABLET BY MOUTH TWICE A DAY   levothyroxine 50 MCG tablet Commonly known as:  SYNTHROID, LEVOTHROID Take 1 tablet (50 mcg total) by mouth daily before breakfast.   losartan 50 MG tablet Commonly known as:  COZAAR Take 1 tablet (50 mg total) by mouth daily.   Magnesium Oxide 400 (240 Mg) MG Tabs TAKE 1 TABLET BY MOUTH EVERY DAY What changed:    how much to take  how to take this  when to take this   metolazone 5 MG tablet Commonly known as:  ZAROXOLYN Take 1 tablet (5 mg total) by mouth once a week.   nitroGLYCERIN 0.4 MG SL tablet Commonly known as:  NITROSTAT PLACE 1 TABLET (0.4 MG TOTAL) UNDER THE TONGUE EVERY 5 (FIVE) MINUTES AS NEEDED FOR CHEST PAIN.   ondansetron 4 MG tablet Commonly known as:  ZOFRAN Take 1 tablet (4 mg total) by mouth every 8 (eight) hours as needed for nausea or vomiting.   ONETOUCH VERIO test strip Generic drug:  glucose blood   pregabalin 150 MG capsule Commonly known as:  LYRICA Take  1 capsule (150 mg total) by mouth 2 (two) times daily.   rosuvastatin 20 MG tablet Commonly known as:  CRESTOR TAKE 1 TABLET BY MOUTH EVERY DAY   torsemide 20 MG  tablet Commonly known as:  DEMADEX Take 80 mg by mouth 2 (two) times daily.   vitamin E 1000 UNIT capsule Generic drug:  vitamin E Take 1,000 Units by mouth daily.       Allergies  Allergen Reactions  . Codeine Nausea And Vomiting    Consultations: None   Procedures/Studies: Nm Pulmonary Perf And Vent  Result Date: 02/14/2018 CLINICAL DATA:  67 year old female with acute shortness of breath and chest pain. EXAM: NUCLEAR MEDICINE VENTILATION - PERFUSION LUNG SCAN TECHNIQUE: Ventilation images were obtained in multiple projections using inhaled aerosol Tc-65m DTPA. Perfusion images were obtained in multiple projections after intravenous injection of Tc-36m-MAA. RADIOPHARMACEUTICALS:  31.0 mCi of Tc-17m DTPA aerosol inhalation and 4.3 mCi Tc75m-MAA IV COMPARISON:  02/13/2018 chest radiograph FINDINGS: Ventilation: No significant ventilation abnormalities noted. Perfusion: No wedge shaped peripheral perfusion defects or ventilation/perfusion mismatches to suggest acute pulmonary embolism. IMPRESSION: 1. Normal perfusion.  No evidence of pulmonary emboli. Electronically Signed   By: Margarette Canada M.D.   On: 02/14/2018 11:32   Dg Chest Portable 1 View  Result Date: 02/13/2018 CLINICAL DATA:  Encounter for chest pain and SOB that began earlier today. Pain is on the left side, pt states normally it is intermittent but has been constant today. EXAM: PORTABLE CHEST 1 VIEW COMPARISON:  07/16/2017 FINDINGS: Cardiac silhouette is normal in size. No mediastinal or hilar masses. There are prominent bronchovascular markings most evident in the lower lungs. No convincing pneumonia or pulmonary edema. No pleural effusion or pneumothorax. Skeletal structures are grossly intact. IMPRESSION: No acute cardiopulmonary disease. Electronically Signed   By: Lajean Manes M.D.   On: 02/13/2018 14:09   Dg Foot Complete Right  Result Date: 02/13/2018 CLINICAL DATA:  Ulcer at great toe EXAM: RIGHT FOOT COMPLETE - 3+  VIEW COMPARISON:  10/01/2012 FINDINGS: No visible bone destruction within the great toe to suggest osteomyelitis. No fracture, subluxation or dislocation. Advanced degenerative changes in the midfoot and hindfoot. Large calcaneal spurs. Vascular calcifications. IMPRESSION: No acute bony abnormality. Note radiographic changes of osteomyelitis. Electronically Signed   By: Rolm Baptise M.D.   On: 02/13/2018 17:27   Xr Foot Complete Left  Result Date: 02/10/2018 Radiographs left foot are negative.  No acute finding.  No sign of osteomyelitis great toe.     Subjective: Overall status improved. Reports feeling better.   Discharge Exam: Vitals:   02/13/18 2013 02/14/18 0534  BP: (!) 163/75 136/65  Pulse: 85 72  Resp: 18 18  Temp: 98.2 F (36.8 C) 99.2 F (37.3 C)  SpO2: 95% 98%   Vitals:   02/13/18 1915 02/13/18 1930 02/13/18 2013 02/14/18 0534  BP: 138/75 139/74 (!) 163/75 136/65  Pulse: 84 85 85 72  Resp: 15 15 18 18   Temp:   98.2 F (36.8 C) 99.2 F (37.3 C)  TempSrc:   Oral Oral  SpO2: 100% 99% 95% 98%  Weight:    130 kg  Height:       Eyes: PERRL, lids and conjunctivae normal ENMT: Mucous membranes are moist. Posterior pharynx clear of any exudate or lesions.Normal dentition.  Neck: normal, supple, no masses, no thyromegaly Respiratory: She has mild bilateral crackles at the bases, no wheezing or rhonchi, normal respiratory effort. No accessory muscle use.  Cardiovascular: Regular rate and  rhythm, no murmurs / rubs / gallops.  1+ lower extremity edema, with venous stasis changes noted in her legs 2+ pedal pulses. No carotid bruits.  Abdomen: no tenderness, obese, no masses palpated. No hepatosplenomegaly. Bowel sounds positive.  Musculoskeletal: no clubbing / cyanosis. No joint deformity upper and lower extremities. Good ROM, no contractures. Normal muscle tone.  Skin: no rashes, lesions, ulcers. No induration.  Bilateral venous stasis changes.  No cellulitis is  noted Neurologic: CN 2-12 grossly intact. Sensation intact, DTR normal. Strength 5/5 in all 4.  Psychiatric: Normal judgment and insight. Alert and oriented x 3.      The results of significant diagnostics from this hospitalization (including imaging, microbiology, ancillary and laboratory) are listed below for reference.     Microbiology: Recent Results (from the past 240 hour(s))  MRSA PCR Screening     Status: None   Collection Time: 02/13/18  8:32 PM  Result Value Ref Range Status   MRSA by PCR NEGATIVE NEGATIVE Final    Comment:        The GeneXpert MRSA Assay (FDA approved for NASAL specimens only), is one component of a comprehensive MRSA colonization surveillance program. It is not intended to diagnose MRSA infection nor to guide or monitor treatment for MRSA infections. Performed at [facility], [place] [address] [place], [place] [address]      Labs: BNP (last 3 results) Recent Labs    04/29/17 1109 02/13/18 1247  BNP 96.3 841.6*   Basic Metabolic Panel: Recent Labs  Lab 02/13/18 1246 02/14/18 0517  NA 136 140  K 4.7 4.0  CL 103 107  CO2 22 27  GLUCOSE 498* 307*  BUN 45* 44*  CREATININE 2.00* 1.81*  CALCIUM 8.4* 8.5*   Liver Function Tests: Recent Labs  Lab 02/14/18 0517  AST 15  ALT 23  ALKPHOS 188*  BILITOT 0.8  PROT 5.6*  ALBUMIN 2.6*   No results for input(s): LIPASE, AMYLASE in the last 168 hours. No results for input(s): AMMONIA in the last 168 hours. CBC: Recent Labs  Lab 02/13/18 1246 02/14/18 0517  WBC 8.7 6.2  NEUTROABS  --  4.8  HGB 9.7* 8.6*  HCT 33.3* 28.9*  MCV 83.3 81.2  PLT 227 206   Cardiac Enzymes: No results for input(s): CKTOTAL, CKMB, CKMBINDEX, TROPONINI in the last 168 hours. BNP: Invalid input(s): POCBNP CBG: Recent Labs  Lab 02/13/18 1628 02/13/18 2036 02/14/18 0725 02/14/18 1212  GLUCAP 412* 343* 281* 182*   D-Dimer No results for input(s): DDIMER in the last 72 hours. Hgb A1c No  results for input(s): HGBA1C in the last 72 hours. Lipid Profile No results for input(s): CHOL, HDL, LDLCALC, TRIG, CHOLHDL, LDLDIRECT in the last 72 hours. Thyroid function studies Recent Labs    02/14/18 1118  TSH 0.964   Anemia work up Recent Labs    02/14/18 1118  VITAMINB12 234  FOLATE 18.9  FERRITIN 47  TIBC 241*  IRON 20*  RETICCTPCT 2.0   Urinalysis    Component Value Date/Time   COLORURINE YELLOW 07/22/2017 2053   APPEARANCEUR CLOUDY (A) 07/22/2017 2053   LABSPEC 1.012 07/22/2017 2053   PHURINE 5.0 07/22/2017 2053   GLUCOSEU NEGATIVE 07/22/2017 2053   HGBUR SMALL (A) 07/22/2017 2053   BILIRUBINUR NEGATIVE 07/22/2017 2053   [place] NEGATIVE 07/22/2017 2053   PROTEINUR 30 (A) 07/22/2017 2053   UROBILINOGEN 0.2 04/01/2014 1659   NITRITE NEGATIVE 07/22/2017 2053   LEUKOCYTESUR LARGE (A) 07/22/2017 2053  Sepsis Labs Invalid input(s): PROCALCITONIN,  WBC,  LACTICIDVEN Microbiology Recent Results (from the past 240 hour(s))  MRSA PCR Screening     Status: None   Collection Time: 02/13/18  8:32 PM  Result Value Ref Range Status   MRSA by PCR NEGATIVE NEGATIVE Final    Comment:        The GeneXpert MRSA Assay (FDA approved for NASAL specimens only), is one component of a comprehensive MRSA colonization surveillance program. It is not intended to diagnose MRSA infection nor to guide or monitor treatment for MRSA infections. Performed at [facility], [place] [address] [place],  [address]      Time coordinating discharge: Over 30 minutes  SIGNED:   Sharene Butters, MD  [facility] 02/14/2018, 4:25 PM   If 7PM-7AM, please contact night-coverage [email or web address] Password [email or web address]

## 2018-02-14 NOTE — Progress Notes (Signed)
Patient ready for discharge from unit to home. Family here to transport. IV discontinued, Tele removed. All d/c instructions, meds and follow up appts are reviewed with pt. Patient demonstrates no s/sx of distress , has no offer of complaint.  All personal belongings with pt.  Pt has appt with ortho in the AM at 1030.

## 2018-02-14 NOTE — Care Management Note (Signed)
Case Management Note  Patient Details  Name: Susan [name] MRN: [id number] Date of Birth: 1950-10-15  Subjective/Objective:                    Action/Plan:  Spoke with patient and family at bedside. Patient is from home w spouse. The deny barriers to obtaining or paying for meds, or getting to MD for appointments. SHe states she weighs herself daily. She verifies she has a RW at home. She has used [facility] in the past and would like to use them again if needed.  Coverage: HTA PCP [facility]   Expected Discharge Date:                  Expected Discharge Plan:     In-House Referral:     Discharge planning Services  CM Consult  Post Acute Care Choice:    Choice offered to:     DME Arranged:    DME Agency:     HH Arranged:    HH Agency:     Status of Service:  In process, will continue to follow  If discussed at Long Length of Stay Meetings, dates discussed:    Additional Comments:  Carles Collet, RN 02/14/2018, 4:07 PM

## 2018-02-14 NOTE — Care Management Obs Status (Signed)
[place] NOTIFICATION   Patient Details  Name: Susan [name] MRN: [id number] Date of Birth: 05/06/1951   Medicare Observation Status Notification Given:  Yes    Carles Collet, RN 02/14/2018, 3:51 PM

## 2018-02-15 ENCOUNTER — Ambulatory Visit (INDEPENDENT_AMBULATORY_CARE_PROVIDER_SITE_OTHER): Payer: PPO | Admitting: Orthopedic Surgery

## 2018-02-15 ENCOUNTER — Encounter (INDEPENDENT_AMBULATORY_CARE_PROVIDER_SITE_OTHER): Payer: Self-pay | Admitting: Orthopedic Surgery

## 2018-02-15 VITALS — Ht 65.0 in | Wt 286.6 lb

## 2018-02-15 DIAGNOSIS — D631 Anemia in chronic kidney disease: Secondary | ICD-10-CM | POA: Diagnosis not present

## 2018-02-15 DIAGNOSIS — E1122 Type 2 diabetes mellitus with diabetic chronic kidney disease: Secondary | ICD-10-CM | POA: Diagnosis not present

## 2018-02-15 DIAGNOSIS — I5030 Unspecified diastolic (congestive) heart failure: Secondary | ICD-10-CM | POA: Diagnosis not present

## 2018-02-15 DIAGNOSIS — G4733 Obstructive sleep apnea (adult) (pediatric): Secondary | ICD-10-CM | POA: Diagnosis not present

## 2018-02-15 DIAGNOSIS — L97919 Non-pressure chronic ulcer of unspecified part of right lower leg with unspecified severity: Secondary | ICD-10-CM | POA: Diagnosis not present

## 2018-02-15 DIAGNOSIS — I87333 Chronic venous hypertension (idiopathic) with ulcer and inflammation of bilateral lower extremity: Secondary | ICD-10-CM

## 2018-02-15 DIAGNOSIS — N184 Chronic kidney disease, stage 4 (severe): Secondary | ICD-10-CM | POA: Diagnosis not present

## 2018-02-15 DIAGNOSIS — L97929 Non-pressure chronic ulcer of unspecified part of left lower leg with unspecified severity: Secondary | ICD-10-CM | POA: Diagnosis not present

## 2018-02-15 DIAGNOSIS — I129 Hypertensive chronic kidney disease with stage 1 through stage 4 chronic kidney disease, or unspecified chronic kidney disease: Secondary | ICD-10-CM | POA: Diagnosis not present

## 2018-02-15 NOTE — Progress Notes (Signed)
Office Visit Note   Patient: Susan [name]           Date of Birth: 04-Nov-1950           MRN: [id number] Visit Date: 02/15/2018              Requested by: Alycia Rossetti, MD [address] [place], [place] [address] PCP: Alycia Rossetti, MD  Chief Complaint  Patient presents with  . Left Foot - Wound Check, Follow-up    Left Great Toe      HPI: Patient is a 67 year old woman with venous stasis insufficiency bilateral lower extremities as well as a blister on the right great toe and right second toe.  Patient did go to the emergency room she was placed in [place] but this was cut off.  Patient complains of arthritis of both knees.  She states she was on 10 days of antibiotics.   Assessment & Plan: Visit Diagnoses:  1. Idiopathic chronic venous hypertension of both lower extremities with ulcer and inflammation ([place])     Plan: We will apply compression wraps to both lower extremities with a 3 layer compression Dynaflex wrap we will wrap the toe blisters as well.  Discussed the importance of elevation compression and exercise avoid sitting avoid standing.  Also discussed that with total knee arthroplasty we would have to get her BMI less than 40 and her A1c better than 8.  Follow-Up Instructions: Return in about 1 week (around 02/22/2018).   Ortho Exam  Patient is alert, oriented, no adenopathy, well-dressed, normal affect, normal respiratory effort. Examination patient has significant venous stasis swelling on a edema in both lower extremities with pitting edema up to the tibial tubercle.  I cannot palpate a pulse a Doppler was used and patient has a strong dorsalis pedis and posterior tibial biphasic pulse.  Patient states her hemoglobin A1c was greater than 11.  Patient states she is recently gained over 100 pounds.  There is no cellulitis no exposed bone no exposed tendon where the blisters are these appear to be superficial blisters from swelling.  Imaging: No results  found. No images are attached to the encounter.  Labs: Lab Results  Component Value Date   HGBA1C 9.3 (H) 09/25/2017   HGBA1C 11.4 (H) 07/16/2017   HGBA1C 10.2 (H) 02/27/2017   REPTSTATUS 07/25/2017 FINAL 07/22/2017   CULT >=100,000 COLONIES/mL KLEBSIELLA PNEUMONIAE (A) 07/22/2017   LABORGA KLEBSIELLA PNEUMONIAE (A) 07/22/2017     Lab Results  Component Value Date   ALBUMIN 2.6 (L) 02/14/2018   ALBUMIN 2.1 (L) 07/24/2017   ALBUMIN 2.4 (L) 07/23/2017    Body mass index is 47.69 kg/m.  Orders:  No orders of the defined types were placed in this encounter.  No orders of the defined types were placed in this encounter.    Procedures: No procedures performed  Clinical Data: No additional findings.  ROS:  All other systems negative, except as noted in the HPI. Review of Systems  Objective: Vital Signs: Ht 5\' 5"  (1.651 m)   Wt 286 lb 9.6 oz (130 kg)   BMI 47.69 kg/m   Specialty Comments:  No specialty comments available.  PMFS History: Patient Active Problem List   Diagnosis Date Noted  . Acute on chronic diastolic (congestive) heart failure ([place]) 02/13/2018  . PVD (peripheral vascular disease) ([place]) 10/26/2017  . Hypothyroid 07/27/2017  . PAH (pulmonary artery hypertension) ([place])   . Impaired ambulation 07/19/2017  . Hyperosmolar non-ketotic  state in patient with type 2 diabetes mellitus ([place]) 07/15/2017  . Leg cramps 02/27/2017  . Peripheral edema 01/12/2017  . Diabetic neuropathy ([place]) 11/12/2016  . 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 mellitus ([place]) 10/24/2015  . Anemia 10/03/2015  . Generalized anxiety disorder 10/03/2015  . Insomnia 10/03/2015  . Diabetes mellitus type II, uncontrolled ([place]) 06/07/2015  . Chronic diastolic CHF (congestive heart failure) ([place]) 06/07/2015  . Non compliance with medical treatment 04/17/2014  . Rotator cuff tear 03/14/2014  . Morbid obesity ([place]) 09/23/2013  . Urinary incontinence   . MDD (major depressive disorder)  11/12/2010  . RBBB (right bundle branch block)   . CAD (coronary artery disease)   . Hyperlipemia 01/22/2009  . Essential hypertension 01/22/2009   Past Medical History:  Diagnosis Date  . Acute MI ([place]) 1999; 2007  . Anemia    hx  . Anginal pain ([place])   . Anxiety   . ARF (acute renal failure) ([place]) 06/2017  . Arthritis    "generalized" (03/15/2014)  . CAD (coronary artery disease)    MI in 2000 - MI  2007 - treated bare metal stent (no nuclear since then as 9/11)  . Carotid artery disease ([place])   . CHF (congestive heart failure) ([place])   . Chronic diastolic heart failure (HCC)    a) ECHO (08/2013) EF 55-60% and RV function nl b) RHC (08/2013) RA 4, RV 30/5/7, PA 25/10 (16), PCWP 7, Fick CO/CI 6.3/2.7, PVR 1.5 WU, PA 61 and 66%  . Daily headache    "~ every other day; since I fell in June" (03/15/2014)  . Depression   . Dyslipidemia   . Exertional shortness of breath   . HTN (hypertension)   . Hypothyroidism   . Obesity   . Osteoarthritis   . Peripheral neuropathy   . PONV (postoperative nausea and vomiting)   . RBBB (right bundle branch block)    Old  . Renal insufficiency    DENIES  . Stroke [facility])    mini strokes  . Syncope    likely due to low blood sugar  . Tachycardia    Sinus tachycardia  . Type II diabetes mellitus ([place])   . Urinary incontinence   . Venous insufficiency     Family History  Problem Relation Age of Onset  . Heart attack Mother 16    Past Surgical History:  Procedure Laterality Date  . ABDOMINAL HYSTERECTOMY  1980's  . CATARACT EXTRACTION, BILATERAL Bilateral ?2013  . CORONARY ANGIOPLASTY WITH STENT PLACEMENT  1999; 2007   "1 + 1"  . KNEE ARTHROSCOPY Left 10/25/2006  . RIGHT HEART CATH N/A 07/24/2017   Procedure: RIGHT HEART CATH;  Surgeon: Jolaine Artist, MD;  Location: [place] CV LAB;  Service: Cardiovascular;  Laterality: N/A;  . RIGHT HEART CATHETERIZATION N/A 09/22/2013   Procedure: RIGHT HEART CATH;  Surgeon: Jolaine Artist, MD;  Location: [facility] CATH LAB;  Service: Cardiovascular;  Laterality: N/A;  . SHOULDER ARTHROSCOPY W/ ROTATOR CUFF REPAIR Right 03/14/2014  . SHOULDER ARTHROSCOPY WITH OPEN ROTATOR CUFF REPAIR Right 03/14/2014   Procedure: RIGHT SHOULDER ARTHROSCOPY WITH BICEPS RELEASE, OPEN SUBSCAPULA REPAIR, OPEN SUPRASPINATUS REPAIR.;  Surgeon: Meredith Pel, MD;  Location: [facility];  Service: Orthopedics;  Laterality: Right;  . TUBAL LIGATION  1970's   Social History   Occupational History    Employer: UNEMPLOYED  Tobacco Use  . Smoking status: Former Smoker    Packs/day: 3.00    Years:  32.00    Pack years: 96.00    Types: Cigarettes    Last attempt to quit: 10/24/1997    Years since quitting: 20.3  . Smokeless tobacco: Never Used  Substance and Sexual Activity  . Alcohol use: Yes    Comment: "might have 2-3 daiquiris in the summer"  . Drug use: No  . Sexual activity: Never

## 2018-02-22 ENCOUNTER — Other Ambulatory Visit (HOSPITAL_COMMUNITY): Payer: Self-pay | Admitting: Internal Medicine

## 2018-02-22 ENCOUNTER — Ambulatory Visit (INDEPENDENT_AMBULATORY_CARE_PROVIDER_SITE_OTHER): Payer: PPO | Admitting: Orthopedic Surgery

## 2018-02-22 ENCOUNTER — Encounter (INDEPENDENT_AMBULATORY_CARE_PROVIDER_SITE_OTHER): Payer: Self-pay | Admitting: Orthopedic Surgery

## 2018-02-22 ENCOUNTER — Encounter (HOSPITAL_COMMUNITY): Payer: Self-pay

## 2018-02-22 VITALS — Ht 65.0 in | Wt 286.6 lb

## 2018-02-22 DIAGNOSIS — L97916 Non-pressure chronic ulcer of unspecified part of right lower leg with bone involvement without evidence of necrosis: Secondary | ICD-10-CM | POA: Diagnosis not present

## 2018-02-22 DIAGNOSIS — L97929 Non-pressure chronic ulcer of unspecified part of left lower leg with unspecified severity: Secondary | ICD-10-CM

## 2018-02-22 DIAGNOSIS — Z6841 Body Mass Index (BMI) 40.0 and over, adult: Secondary | ICD-10-CM | POA: Diagnosis not present

## 2018-02-22 DIAGNOSIS — E1142 Type 2 diabetes mellitus with diabetic polyneuropathy: Secondary | ICD-10-CM | POA: Diagnosis not present

## 2018-02-22 DIAGNOSIS — I87333 Chronic venous hypertension (idiopathic) with ulcer and inflammation of bilateral lower extremity: Secondary | ICD-10-CM

## 2018-02-22 DIAGNOSIS — L97919 Non-pressure chronic ulcer of unspecified part of right lower leg with unspecified severity: Secondary | ICD-10-CM | POA: Diagnosis not present

## 2018-02-22 DIAGNOSIS — E43 Unspecified severe protein-calorie malnutrition: Secondary | ICD-10-CM

## 2018-02-22 DIAGNOSIS — M86171 Other acute osteomyelitis, right ankle and foot: Secondary | ICD-10-CM | POA: Diagnosis not present

## 2018-02-22 MED ORDER — DOXYCYCLINE HYCLATE 100 MG PO TABS: 100.0000 mg | ORAL_TABLET | Freq: Two times a day (BID) | ORAL | 0 refills | Status: DC

## 2018-02-22 NOTE — Progress Notes (Signed)
Office Visit Note   Patient: Susan [name]           Date of Birth: [birth year]           MRN: [id number] Visit Date: 02/22/2018              Requested by: Alycia Rossetti, MD [address] [place], [place] [address] PCP: Alycia Rossetti, MD  Chief Complaint  Patient presents with  . Right Leg - Follow-up  . Left Leg - Follow-up      HPI: Patient is a 67 year old woman who presents in follow-up for venous insufficiency bilateral lower extremities.  Patient states that the ulceration and odor from the great toe and second toe has gotten worse.  Patient does have a history of heart failure she states she is taking 80 mg of torsemide twice a day.  Assessment & Plan: Visit Diagnoses:  1. Acute osteomyelitis of toe, right ([place])   2. Idiopathic chronic venous hypertension of both lower extremities with ulcer and inflammation ([place])   3. Severe protein-calorie malnutrition ([place])   4. Diabetic polyneuropathy associated with type 2 diabetes mellitus ([place])   5. Body mass index 45.0-49.9, adult ([place])   6. Morbid obesity ([place])     Plan: Plan: Due to the necrotic ulceration and exposed bone of the great toe and second toe patient will need to proceed with an amputation of the great toe and second toe.  We will reapply the Dynaflex wraps to both legs.  Plan for overnight observation with elevation and minimize weightbearing on the right lower extremity after surgery.  Risks and benefits were discussed including risk of the wound not healing.  Follow-Up Instructions: Return in about 1 week (around 03/01/2018).   Ortho Exam  Patient is alert, oriented, no adenopathy, well-dressed, normal affect, normal respiratory effort. Examination patient has a palpable dorsalis pedis pulse on the left right foot this is not palpable due to swelling the Doppler was used and patient does have a triphasic dorsalis pedis and posterior tibial pulse she has massive venous stasis swelling in both legs her  dressings slid down her legs.  Her calves measure 52 cm in circumference.  There are no ulcers there is some wrinkling in the calf secondary to the compression wraps.  Examination of the great toe she now has a necrotic ulcer over the lateral border of the right great toe with exposed bone and tendon.  There is no ascending cellulitis patient also has a necrotic tissue with exposed bone on the second toe as well there is redness of both the great toe and second toe.  Patient's hemoglobin A1c is elevated at 9.3 her albumin shows severe protein caloric malnutrition with it ranging from 2.1-2.6.  Imaging: No results found. No images are attached to the encounter.  Labs: Lab Results  Component Value Date   HGBA1C 9.3 (H) 09/25/2017   HGBA1C 11.4 (H) 07/16/2017   HGBA1C 10.2 (H) 02/27/2017   REPTSTATUS 07/25/2017 FINAL 07/22/2017   CULT >=100,000 COLONIES/mL KLEBSIELLA PNEUMONIAE (A) 07/22/2017   LABORGA KLEBSIELLA PNEUMONIAE (A) 07/22/2017     Lab Results  Component Value Date   ALBUMIN 2.6 (L) 02/14/2018   ALBUMIN 2.1 (L) 07/24/2017   ALBUMIN 2.4 (L) 07/23/2017    Body mass index is 47.69 kg/m.  Orders:  No orders of the defined types were placed in this encounter.  Meds ordered this encounter  Medications  . doxycycline (VIBRA-TABS) 100 MG tablet  Sig: Take 1 tablet (100 mg total) by mouth 2 (two) times daily.    Dispense:  60 tablet    Refill:  0     Procedures: No procedures performed  Clinical Data: No additional findings.  ROS:  All other systems negative, except as noted in the HPI. Review of Systems  Objective: Vital Signs: Ht 5\' 5"  (1.651 m)   Wt 286 lb 9.6 oz (130 kg)   BMI 47.69 kg/m   Specialty Comments:  No specialty comments available.  PMFS History: Patient Active Problem List   Diagnosis Date Noted  . Acute on chronic diastolic (congestive) heart failure ([place]) 02/13/2018  . PVD (peripheral vascular disease) ([place]) 10/26/2017  . Hypothyroid  07/27/2017  . PAH (pulmonary artery hypertension) ([place])   . Impaired ambulation 07/19/2017  . Hyperosmolar non-ketotic state in patient with type 2 diabetes mellitus ([place]) 07/15/2017  . Leg cramps 02/27/2017  . Peripheral edema 01/12/2017  . Diabetic neuropathy ([place]) 11/12/2016  . 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 mellitus ([place]) 10/24/2015  . Anemia 10/03/2015  . Generalized anxiety disorder 10/03/2015  . Insomnia 10/03/2015  . Diabetes mellitus type II, uncontrolled ([place]) 06/07/2015  . Chronic diastolic CHF (congestive heart failure) ([place]) 06/07/2015  . Non compliance with medical treatment 04/17/2014  . Rotator cuff tear 03/14/2014  . Morbid obesity ([place]) 09/23/2013  . Urinary incontinence   . MDD (major depressive disorder) 11/12/2010  . RBBB (right bundle branch block)   . CAD (coronary artery disease)   . Hyperlipemia 01/22/2009  . Essential hypertension 01/22/2009   Past Medical History:  Diagnosis Date  . Acute MI ([place]) 1999; 2007  . Anemia    hx  . Anginal pain ([place])   . Anxiety   . ARF (acute renal failure) ([place]) 06/2017  . Arthritis    "generalized" (03/15/2014)  . CAD (coronary artery disease)    MI in 2000 - MI  2007 - treated bare metal stent (no nuclear since then as 9/11)  . Carotid artery disease ([place])   . CHF (congestive heart failure) ([place])   . Chronic diastolic heart failure (HCC)    a) ECHO (08/2013) EF 55-60% and RV function nl b) RHC (08/2013) RA 4, RV 30/5/7, PA 25/10 (16), PCWP 7, Fick CO/CI 6.3/2.7, PVR 1.5 WU, PA 61 and 66%  . Daily headache    "~ every other day; since I fell in June" (03/15/2014)  . Depression   . Dyslipidemia   . Exertional shortness of breath   . HTN (hypertension)   . Hypothyroidism   . Obesity   . Osteoarthritis   . Peripheral neuropathy   . PONV (postoperative nausea and vomiting)   . RBBB (right bundle branch block)    Old  . Renal insufficiency    DENIES  . Stroke [facility])    mini strokes  . Syncope    likely due  to low blood sugar  . Tachycardia    Sinus tachycardia  . Type II diabetes mellitus ([place])   . Urinary incontinence   . Venous insufficiency     Family History  Problem Relation Age of Onset  . Heart attack Mother 59    Past Surgical History:  Procedure Laterality Date  . ABDOMINAL HYSTERECTOMY  1980's  . CATARACT EXTRACTION, BILATERAL Bilateral ?2013  . CORONARY ANGIOPLASTY WITH STENT PLACEMENT  1999; 2007   "1 + 1"  . KNEE ARTHROSCOPY Left 10/25/2006  . RIGHT HEART CATH N/A 07/24/2017   Procedure:  RIGHT HEART CATH;  Surgeon: Jolaine Artist, MD;  Location: [place] CV LAB;  Service: Cardiovascular;  Laterality: N/A;  . RIGHT HEART CATHETERIZATION N/A 09/22/2013   Procedure: RIGHT HEART CATH;  Surgeon: Jolaine Artist, MD;  [name] CATH LAB;  Service: Cardiovascular;  Laterality: N/A;  . SHOULDER ARTHROSCOPY W/ ROTATOR CUFF REPAIR Right 03/14/2014  . SHOULDER ARTHROSCOPY WITH OPEN ROTATOR CUFF REPAIR Right 03/14/2014   Procedure: RIGHT SHOULDER ARTHROSCOPY WITH BICEPS RELEASE, OPEN SUBSCAPULA REPAIR, OPEN SUPRASPINATUS REPAIR.;  Surgeon: Meredith Pel, MD;  Location: [place];  Service: Orthopedics;  Laterality: Right;  . TUBAL LIGATION  1970's   Social History   Occupational History    Employer: UNEMPLOYED  Tobacco Use  . Smoking status: Former Smoker    Packs/day: 3.00    Years: 32.00    Pack years: 96.00    Types: Cigarettes    Last attempt to quit: 10/24/1997    Years since quitting: 20.3  . Smokeless tobacco: Never Used  Substance and Sexual Activity  . Alcohol use: Yes    Comment: "might have 2-3 daiquiris in the summer"  . Drug use: No  . Sexual activity: Never

## 2018-02-23 ENCOUNTER — Ambulatory Visit (INDEPENDENT_AMBULATORY_CARE_PROVIDER_SITE_OTHER): Payer: Self-pay | Admitting: Physician Assistant

## 2018-02-23 ENCOUNTER — Encounter (HOSPITAL_COMMUNITY): Payer: Self-pay | Admitting: *Deleted

## 2018-02-23 ENCOUNTER — Ambulatory Visit (INDEPENDENT_AMBULATORY_CARE_PROVIDER_SITE_OTHER): Payer: PPO | Admitting: Orthopedic Surgery

## 2018-02-23 ENCOUNTER — Other Ambulatory Visit: Payer: Self-pay

## 2018-02-23 DIAGNOSIS — E113513 Type 2 diabetes mellitus with proliferative diabetic retinopathy with macular edema, bilateral: Secondary | ICD-10-CM | POA: Diagnosis not present

## 2018-02-23 MED ORDER — DEXTROSE 5 % IV SOLN
3.0000 g | INTRAVENOUS | Status: AC
  Administered 2018-02-24: 2 g via INTRAVENOUS
  Filled 2018-02-23: qty 3

## 2018-02-23 MED ORDER — VANCOMYCIN HCL 10 G IV SOLR
1500.0000 mg | INTRAVENOUS | Status: AC
  Administered 2018-02-24: 1500 mg via INTRAVENOUS
  Filled 2018-02-23: qty 1500

## 2018-02-23 MED ORDER — VANCOMYCIN HCL 10 G IV SOLR
1500.0000 mg | INTRAVENOUS | Status: DC
  Filled 2018-02-23: qty 1500

## 2018-02-23 NOTE — Progress Notes (Addendum)
I called Mrs Eutsler to inform her what Dr Bubba Camp said.  Mrs Hay had called her [employer] resource and the nurse walked Mr Tewell through decreasing basal rate to 1.24 units per hour and it is reduced already. I instructed patient that if CBG is less than 70 that she may take Glucose Liquid Shot.  I reminded patient to recheck CBG 15 minutes later and call pre op desk. I told patient to bring [facility] supplies with her in am.

## 2018-02-23 NOTE — Progress Notes (Addendum)
Dr Suzette Battiest called and said that patient can remain at her basal rate overnight and when she arrives to Pre- OP to remove pump and check CBG's and treat per hospital sliding scale.  Dr. Dolphus Jenny concern is that patient will not be able to reduce basal rate or care for pump in the hospital, "her glucose shouldn't drop low at the basal rate she is on and she tends to run high ." I informed Dr Marin Comment of the information from Dr Suzette Battiest.  I Askedc Dr Marin Comment about the Glucose Liquid Shot, 16 grams of carbs, 60 ml, Dr Marin Comment said that if she needs it for low CBG to take it.

## 2018-02-23 NOTE — Progress Notes (Signed)
Anesthesia Chart Review: SAME DAY WORKUP   Case:  [id number] Date/Time:  02/24/18 1047   Procedure:  RIGHT FOOT GREAT TOE AND 2ND TOE AMPUTATION (Right )   Anesthesia type:  Choice   Pre-op diagnosis:  Osteomyelitis, Abscess Right Foot   Location:  [facility] OR ROOM 03 / [place] [facility]   Surgeon:  Newt Minion, MD      DISCUSSION: 67 yo female former smoker. Pertinent hx includes PONV, RBBB, IDDM with insulin pump, CKD Hypothyroid, diastolic heart failure, CAD MI 2000/2007 BMS 2007, TIA, obesity.  Pt hospitalized 9/21-9/22/2019 for CHF exacerbation. She reported 15lb weight gain over the past week and was having orthopnea, PND, DOE, worsening LE edema. Chest x-ray showed no acute cardiopulmonary disease. EKG showed no significant new change. BNP 728. Troponin negative x1. She received a dose of Lasix 40 mg IV, and twice daily, with good diuresis. She was last seen by cardiology on 02/03/2018, at which time there were no changes in her medication. Echo 02/14/2018, showing EF 65 to 70%, mild LVH, PA peak pressure: 47 mm Hg, not technically sufficient to allow evaluation of LV diastolic function. She is NYHA3B. V/Q scan is negative for PE. After diuresing she was able to ambulate in the hospital on RA and maintain O2 sats.   Seen in office by Dr. Sharol Given 02/22/2018. Per his note "she has massive venous stasis swelling in both legs her dressings slid down her legs.  Her calves measure 52 cm in circumference. There are no ulcers there is some wrinkling in the calf secondary to the compression wraps. Examination of the great toe she now has a necrotic ulcer over the lateral border of the right great toe with exposed bone and tendon.Marland KitchenMarland KitchenDue to the necrotic ulceration and exposed bone of the great toe and second toe patient will need to proceed with an amputation of the great toe and second toe."  Pt with recent CHF exacerbation. Needs expedient surgical intervention due to necrotic toe wound with exposed bone. She will  need DOS eval by anesthesiologist. Anticipate she can proceed as planned barring acute status change.   VS: There were no vitals taken for this visit.  PROVIDERS: Alycia Rossetti, MD is PCP  Glori Bickers, MD is Cardiologist, pt last seen in office 02/03/2018 by Darrick Grinder, DNP.  Madelon Lips, MD is Cardiologist  Jacelyn Pi, MD is Endocrinologist  LABS: Will need DOS labs. Most recent labs below. CKD with baseline creatinine ~2.0    Ref. Range 02/14/2018 05:17  COMPREHENSIVE METABOLIC PANEL Unknown Rpt (A)  Sodium Latest Ref Range: 135 - 145 mmol/L 140  Potassium Latest Ref Range: 3.5 - 5.1 mmol/L 4.0  Chloride Latest Ref Range: 98 - 111 mmol/L 107  CO2 Latest Ref Range: 22 - 32 mmol/L 27  Glucose Latest Ref Range: 70 - 99 mg/dL 307 (H)  BUN Latest Ref Range: 8 - 23 mg/dL 44 (H)  Creatinine Latest Ref Range: 0.44 - 1.00 mg/dL 1.81 (H)  Calcium Latest Ref Range: 8.9 - 10.3 mg/dL 8.5 (L)  Anion gap Latest Ref Range: 5 - 15  6  Alkaline Phosphatase Latest Ref Range: 38 - 126 U/L 188 (H)  Albumin Latest Ref Range: 3.5 - 5.0 g/dL 2.6 (L)  AST Latest Ref Range: 15 - 41 U/L 15  ALT Latest Ref Range: 0 - 44 U/L 23  Total Protein Latest Ref Range: 6.5 - 8.1 g/dL 5.6 (L)  Total Bilirubin Latest Ref Range: 0.3 - 1.2 mg/dL 0.8  GFR, Est Non African American Latest Ref Range: >60 mL/min 28 (L)  GFR, Est African American Latest Ref Range: >60 mL/min 32 (L)  WBC Latest Ref Range: 4.0 - 10.5 K/uL 6.2  RBC Latest Ref Range: 3.87 - 5.11 MIL/uL 3.56 (L)  Hemoglobin Latest Ref Range: 12.0 - 15.0 g/dL 8.6 (L)  HCT Latest Ref Range: 36.0 - 46.0 % 28.9 (L)  MCV Latest Ref Range: 78.0 - 100.0 fL 81.2  MCH Latest Ref Range: 26.0 - 34.0 pg 24.2 (L)  MCHC Latest Ref Range: 30.0 - 36.0 g/dL 29.8 (L)  RDW Latest Ref Range: 11.5 - 15.5 % 16.5 (H)  Platelets Latest Ref Range: 150 - 400 K/uL 206  Neutrophils Latest Units: % 78  Lymphocytes Latest Units: % 13  Monocytes Relative Latest  Units: % 8  Eosinophil Latest Units: % 1  Basophil Latest Units: % 0  NEUT# Latest Ref Range: 1.7 - 7.7 K/uL 4.8  Lymphocyte # Latest Ref Range: 0.7 - 4.0 K/uL 0.8  Monocyte # Latest Ref Range: 0.1 - 1.0 K/uL 0.5  Eosinophils Absolute Latest Ref Range: 0.0 - 0.7 K/uL 0.1  Basophils Absolute Latest Ref Range: 0.0 - 0.1 K/uL 0.0  Immature Granulocytes Latest Units: % 0  Abs Immature Granulocytes Latest Ref Range: 0.0 - 0.1 K/uL 0.0  HIV Screen 4th Generation wRfx Latest Ref Range: Non Reactive  Non Reactive    IMAGES: Pulm VQ scan 02/14/2018: FINDINGS: Ventilation: No significant ventilation abnormalities noted.  Perfusion: No wedge shaped peripheral perfusion defects or ventilation/perfusion mismatches to suggest acute pulmonary embolism.  IMPRESSION: 1. Normal perfusion.  No evidence of pulmonary emboli.   EKG: 02/13/2018: NSR, LAD, Incomplete RBBB, possible anterolateral infarct, age undetermined  CV: TTE 02/14/2018: Study Conclusions  - Left ventricle: The cavity size was normal. Wall thickness was   increased in a pattern of mild LVH. Systolic function was   vigorous. The estimated ejection fraction was in the range of 65%   to 70%. The study is not technically sufficient to allow   evaluation of LV diastolic function. - Pulmonary arteries: PA peak pressure: 47 mm Hg (S).  Right heart cath 07/24/2017: Findings:  RA = 17 RV = 48/15 PA = 49/11 (30) PCW = 18 Fick cardiac output/index = 7.0/3.0 PVR = 0.5 WU Ao sat = 97% PA sat = 62%, 64%  Assessment:  1. Normal left-sided pressures 2. Mild PAH with evidence of RV strain likely due to OHS/OSA 3. Normal cardiac output  Myocardial perfusion imaging 01/29/2017:  Nuclear stress EF: 68%.  Small defect in apical region consistent with probable soft tissue attenuation. (breast) No significant ischemia or scar.  Low risk study  Past Medical History:  Diagnosis Date  . Acute MI ([place]) 1999; 2007  . Anemia     hx  . Anginal pain ([place])   . Anxiety   . ARF (acute renal failure) ([place]) 06/2017  . Arthritis    "generalized" (03/15/2014)  . CAD (coronary artery disease)    MI in 2000 - MI  2007 - treated bare metal stent (no nuclear since then as 9/11)  . Carotid artery disease ([place])   . CHF (congestive heart failure) ([place])   . Chronic diastolic heart failure (HCC)    a) ECHO (08/2013) EF 55-60% and RV function nl b) RHC (08/2013) RA 4, RV 30/5/7, PA 25/10 (16), PCWP 7, Fick CO/CI 6.3/2.7, PVR 1.5 WU, PA 61 and 66%  . Daily headache    "~ every other  day; since I fell in June" (03/15/2014)  . Depression   . Dyslipidemia   . Dyspnea   . Exertional shortness of breath   . HTN (hypertension)   . Hypothyroidism   . Obesity   . Osteoarthritis   . Peripheral neuropathy   . PONV (postoperative nausea and vomiting)   . RBBB (right bundle branch block)    Old  . Renal insufficiency    DENIES  . Stroke [facility])    mini strokes  . Syncope    likely due to low blood sugar  . Tachycardia    Sinus tachycardia  . Type II diabetes mellitus ([place])   . Urinary incontinence   . Venous insufficiency     Past Surgical History:  Procedure Laterality Date  . ABDOMINAL HYSTERECTOMY  1980's  . CATARACT EXTRACTION, BILATERAL Bilateral ?2013  . COLONOSCOPY W/ POLYPECTOMY    . CORONARY ANGIOPLASTY WITH STENT PLACEMENT  1999; 2007   "1 + 1"  . EYE SURGERY Bilateral    lazer  . KNEE ARTHROSCOPY Left 10/25/2006  . RIGHT HEART CATH N/A 07/24/2017   Procedure: RIGHT HEART CATH;  Surgeon: Jolaine Artist, MD;  Location: [place] CV LAB;  Service: Cardiovascular;  Laterality: N/A;  . RIGHT HEART CATHETERIZATION N/A 09/22/2013   Procedure: RIGHT HEART CATH;  Surgeon: Jolaine Artist, MD;  Location: [facility] CATH LAB;  Service: Cardiovascular;  Laterality: N/A;  . SHOULDER ARTHROSCOPY WITH OPEN ROTATOR CUFF REPAIR Right 03/14/2014   Procedure: RIGHT SHOULDER ARTHROSCOPY WITH BICEPS RELEASE, OPEN SUBSCAPULA REPAIR,  OPEN SUPRASPINATUS REPAIR.;  Surgeon: Meredith Pel, MD;  Location: [place];  Service: Orthopedics;  Laterality: Right;  . TUBAL LIGATION  1970's    MEDICATIONS: . vancomycin (VANCOCIN) 1,500 mg in sodium chloride 0.9 % 500 mL IVPB   . Insulin Human (INSULIN PUMP) SOLN  . albuterol (PROVENTIL HFA;VENTOLIN HFA) 108 (90 BASE) MCG/ACT inhaler  . aspirin EC 81 MG tablet  . carvedilol (COREG) 12.5 MG tablet  . cholecalciferol (VITAMIN D) 1000 units tablet  . clopidogrel (PLAVIX) 75 MG tablet  . doxycycline (VIBRA-TABS) 100 MG tablet  . escitalopram (LEXAPRO) 20 MG tablet  . ferrous sulfate 325 (65 FE) MG tablet  . insulin regular (NOVOLIN R,HUMULIN R) 100 units/mL injection  . isosorbide dinitrate (ISORDIL) 10 MG tablet  . levothyroxine (SYNTHROID, LEVOTHROID) 50 MCG tablet  . losartan (COZAAR) 50 MG tablet  . Magnesium Oxide 400 (240 Mg) MG TABS  . metolazone (ZAROXOLYN) 2.5 MG tablet  . metolazone (ZAROXOLYN) 5 MG tablet  . nitroGLYCERIN (NITROSTAT) 0.4 MG SL tablet  . ondansetron (ZOFRAN) 4 MG tablet  . ONETOUCH VERIO test strip  . pregabalin (LYRICA) 150 MG capsule  . rosuvastatin (CRESTOR) 20 MG tablet  . torsemide (DEMADEX) 20 MG tablet  . vitamin E (VITAMIN E) 1000 UNIT capsule     Wynonia Musty [facility] Short Stay Center/Anesthesiology Phone [phone] 02/23/2018 4:30 PM

## 2018-02-23 NOTE — Progress Notes (Addendum)
[name] denies chest pain, states she has not had any chest pain since discharge from hospital on 02/14/18. Patient gets short of breath with activity. Patient had to get out of chair and walk to another area of the house I heard wheezing.  I asked patient if she was wheezing, she said yes I do when I get short of breath, when she sat down breathing return to normal quickly and I did not hear any more wheezing. I asked patient if she obtained a new Albuterol inhaler , she had not.  I encouraged patient to obtain a new inhaler. [name] has a history of Type II  diabetes.  Patient reports that she has an Omipump , Insulin pump.  Patient said that she has had it for about 6 weeks, I asked what the basal rate is set for, patient did not know, but she checked the pump and it reads 1.55 U/hr. I instructed patient to  decrease it by 20% by midnight which is 1.24 units per hour. Mrs Forget said that she is not sure if she knows how to do it, but that she has a name of a resource person that has worked with her.  I told patient that I am going to call Dr Suzette Battiest and check to see if decreasing basal by 20 % at midnight is ok with her and that someone from Dr Christeen Douglas office would call her if there is a different order. Mrs Kriegel reports that CBG was 124 this am, patient has been nauseous and not able to eat.  Patient is taking Zofran for nausea. I instructed patient to check CBG after awaking and every 2 hours until arrival  to the hospital.  I Instructed patient if CBG is less than 70  to drink 1/2 cup of a clear juice.  Patient said that she will not be able to drink juice.  Patient said that she has a Glucose Liquid Shot to drink if CBG gets low.  I told patient that I would have to get approve for that, it has 60  ml of fluid and 16 grams of carbs. Recheck CBG in 15 minutes then call pre- op desk at [phone] for further instructions.

## 2018-02-24 ENCOUNTER — Ambulatory Visit (HOSPITAL_COMMUNITY): Payer: PPO | Admitting: Physician Assistant

## 2018-02-24 ENCOUNTER — Encounter (HOSPITAL_COMMUNITY)
Admission: RE | Disposition: A | Discharge status: Home / Self Care | Payer: Self-pay | Source: Ambulatory Visit | Attending: Orthopedic Surgery

## 2018-02-24 ENCOUNTER — Encounter (HOSPITAL_COMMUNITY): Payer: Self-pay | Admitting: Certified Registered Nurse Anesthetist

## 2018-02-24 ENCOUNTER — Ambulatory Visit (HOSPITAL_COMMUNITY)
Admission: RE | Admit: 2018-02-24 | Discharge: 2018-02-26 | Disposition: A | Discharge status: Home / Self Care | Payer: PPO | Source: Ambulatory Visit | Attending: Orthopedic Surgery | Admitting: Orthopedic Surgery

## 2018-02-24 DIAGNOSIS — I872 Venous insufficiency (chronic) (peripheral): Secondary | ICD-10-CM | POA: Diagnosis not present

## 2018-02-24 DIAGNOSIS — M869 Osteomyelitis, unspecified: Secondary | ICD-10-CM

## 2018-02-24 DIAGNOSIS — L97519 Non-pressure chronic ulcer of other part of right foot with unspecified severity: Secondary | ICD-10-CM | POA: Diagnosis not present

## 2018-02-24 DIAGNOSIS — Z955 Presence of coronary angioplasty implant and graft: Secondary | ICD-10-CM | POA: Diagnosis not present

## 2018-02-24 DIAGNOSIS — I83019 Varicose veins of right lower extremity with ulcer of unspecified site: Secondary | ICD-10-CM | POA: Diagnosis not present

## 2018-02-24 DIAGNOSIS — S98111A Complete traumatic amputation of right great toe, initial encounter: Secondary | ICD-10-CM

## 2018-02-24 DIAGNOSIS — E039 Hypothyroidism, unspecified: Secondary | ICD-10-CM | POA: Insufficient documentation

## 2018-02-24 DIAGNOSIS — E43 Unspecified severe protein-calorie malnutrition: Secondary | ICD-10-CM | POA: Insufficient documentation

## 2018-02-24 DIAGNOSIS — I83029 Varicose veins of left lower extremity with ulcer of unspecified site: Secondary | ICD-10-CM

## 2018-02-24 DIAGNOSIS — F329 Major depressive disorder, single episode, unspecified: Secondary | ICD-10-CM | POA: Insufficient documentation

## 2018-02-24 DIAGNOSIS — M1990 Unspecified osteoarthritis, unspecified site: Secondary | ICD-10-CM | POA: Insufficient documentation

## 2018-02-24 DIAGNOSIS — Z87891 Personal history of nicotine dependence: Secondary | ICD-10-CM | POA: Insufficient documentation

## 2018-02-24 DIAGNOSIS — E11621 Type 2 diabetes mellitus with foot ulcer: Secondary | ICD-10-CM | POA: Insufficient documentation

## 2018-02-24 DIAGNOSIS — Z7982 Long term (current) use of aspirin: Secondary | ICD-10-CM | POA: Insufficient documentation

## 2018-02-24 DIAGNOSIS — Z794 Long term (current) use of insulin: Secondary | ICD-10-CM | POA: Diagnosis not present

## 2018-02-24 DIAGNOSIS — I10 Essential (primary) hypertension: Secondary | ICD-10-CM | POA: Diagnosis not present

## 2018-02-24 DIAGNOSIS — E1142 Type 2 diabetes mellitus with diabetic polyneuropathy: Secondary | ICD-10-CM | POA: Insufficient documentation

## 2018-02-24 DIAGNOSIS — L97929 Non-pressure chronic ulcer of unspecified part of left lower leg with unspecified severity: Secondary | ICD-10-CM | POA: Diagnosis not present

## 2018-02-24 DIAGNOSIS — I2510 Atherosclerotic heart disease of native coronary artery without angina pectoris: Secondary | ICD-10-CM | POA: Diagnosis not present

## 2018-02-24 DIAGNOSIS — Z6841 Body Mass Index (BMI) 40.0 and over, adult: Secondary | ICD-10-CM | POA: Diagnosis not present

## 2018-02-24 DIAGNOSIS — I252 Old myocardial infarction: Secondary | ICD-10-CM | POA: Insufficient documentation

## 2018-02-24 DIAGNOSIS — Z7902 Long term (current) use of antithrombotics/antiplatelets: Secondary | ICD-10-CM | POA: Insufficient documentation

## 2018-02-24 DIAGNOSIS — L02611 Cutaneous abscess of right foot: Secondary | ICD-10-CM | POA: Diagnosis not present

## 2018-02-24 DIAGNOSIS — L97919 Non-pressure chronic ulcer of unspecified part of right lower leg with unspecified severity: Secondary | ICD-10-CM | POA: Diagnosis not present

## 2018-02-24 DIAGNOSIS — F419 Anxiety disorder, unspecified: Secondary | ICD-10-CM | POA: Diagnosis not present

## 2018-02-24 DIAGNOSIS — Z8673 Personal history of transient ischemic attack (TIA), and cerebral infarction without residual deficits: Secondary | ICD-10-CM | POA: Diagnosis not present

## 2018-02-24 DIAGNOSIS — I110 Hypertensive heart disease with heart failure: Secondary | ICD-10-CM | POA: Diagnosis not present

## 2018-02-24 DIAGNOSIS — E785 Hyperlipidemia, unspecified: Secondary | ICD-10-CM | POA: Diagnosis not present

## 2018-02-24 DIAGNOSIS — E1169 Type 2 diabetes mellitus with other specified complication: Secondary | ICD-10-CM | POA: Diagnosis not present

## 2018-02-24 DIAGNOSIS — I5032 Chronic diastolic (congestive) heart failure: Secondary | ICD-10-CM | POA: Diagnosis not present

## 2018-02-24 DIAGNOSIS — M86171 Other acute osteomyelitis, right ankle and foot: Secondary | ICD-10-CM | POA: Diagnosis not present

## 2018-02-24 HISTORY — DX: Dyspnea, unspecified: R06.00

## 2018-02-24 HISTORY — DX: Polyneuropathy, unspecified: G62.9

## 2018-02-24 HISTORY — PX: TOE AMPUTATION: SHX809

## 2018-02-24 HISTORY — PX: AMPUTATION: SHX166

## 2018-02-24 LAB — GLUCOSE, CAPILLARY
Glucose-Capillary: 312 mg/dL — ABNORMAL HIGH (ref 70–99)
Glucose-Capillary: 279 mg/dL — ABNORMAL HIGH (ref 70–99)
Glucose-Capillary: 257 mg/dL — ABNORMAL HIGH (ref 70–99)
Glucose-Capillary: 198 mg/dL — ABNORMAL HIGH (ref 70–99)
Glucose-Capillary: 128 mg/dL — ABNORMAL HIGH (ref 70–99)
Glucose-Capillary: 145 mg/dL — ABNORMAL HIGH (ref 70–99)

## 2018-02-24 SURGERY — AMPUTATION DIGIT
Anesthesia: Regional | Laterality: Right

## 2018-02-24 MED ORDER — METOLAZONE 2.5 MG PO TABS
2.5000 mg | ORAL_TABLET | ORAL | Status: DC
  Administered 2018-02-25: 2.5 mg via ORAL
  Filled 2018-02-24: qty 1

## 2018-02-24 MED ORDER — INSULIN ASPART 100 UNIT/ML ~~LOC~~ SOLN: 0.0000 [IU] | Freq: Three times a day (TID) | SUBCUTANEOUS | Status: DC

## 2018-02-24 MED ORDER — LACTATED RINGERS IV SOLN
INTRAVENOUS | Status: DC
  Administered 2018-02-24: 11:00:00 via INTRAVENOUS

## 2018-02-24 MED ORDER — ISOSORBIDE DINITRATE 10 MG PO TABS
10.0000 mg | ORAL_TABLET | Freq: Two times a day (BID) | ORAL | Status: DC
  Administered 2018-02-24 – 2018-02-26 (×4): 10 mg via ORAL
  Filled 2018-02-24 (×4): qty 1

## 2018-02-24 MED ORDER — CLOPIDOGREL BISULFATE 75 MG PO TABS
75.0000 mg | ORAL_TABLET | Freq: Every day | ORAL | Status: DC
  Administered 2018-02-24 – 2018-02-26 (×3): 75 mg via ORAL
  Filled 2018-02-24 (×3): qty 1

## 2018-02-24 MED ORDER — MAGNESIUM CITRATE PO SOLN: 1.0000 | Freq: Once | ORAL | Status: DC | PRN

## 2018-02-24 MED ORDER — CHLORHEXIDINE GLUCONATE 4 % EX LIQD: 60.0000 mL | Freq: Once | CUTANEOUS | Status: DC

## 2018-02-24 MED ORDER — ONDANSETRON HCL 4 MG/2ML IJ SOLN
INTRAMUSCULAR | Status: DC | PRN
  Administered 2018-02-24: 4 mg via INTRAVENOUS

## 2018-02-24 MED ORDER — POLYETHYLENE GLYCOL 3350 17 G PO PACK: 17.0000 g | PACK | Freq: Every day | ORAL | Status: DC | PRN

## 2018-02-24 MED ORDER — VANCOMYCIN HCL IN DEXTROSE 1-5 GM/200ML-% IV SOLN
1000.0000 mg | Freq: Two times a day (BID) | INTRAVENOUS | Status: DC
  Filled 2018-02-24: qty 200

## 2018-02-24 MED ORDER — ALBUTEROL SULFATE (2.5 MG/3ML) 0.083% IN NEBU: 2.5000 mg | INHALATION_SOLUTION | Freq: Four times a day (QID) | RESPIRATORY_TRACT | Status: DC | PRN

## 2018-02-24 MED ORDER — METOCLOPRAMIDE HCL 5 MG/ML IJ SOLN: 5.0000 mg | Freq: Three times a day (TID) | INTRAMUSCULAR | Status: DC | PRN

## 2018-02-24 MED ORDER — PREGABALIN 75 MG PO CAPS
150.0000 mg | ORAL_CAPSULE | Freq: Two times a day (BID) | ORAL | Status: DC
  Administered 2018-02-24 – 2018-02-26 (×4): 150 mg via ORAL
  Filled 2018-02-24 (×4): qty 2

## 2018-02-24 MED ORDER — ESCITALOPRAM OXALATE 20 MG PO TABS
20.0000 mg | ORAL_TABLET | Freq: Every day | ORAL | Status: DC
  Administered 2018-02-25 – 2018-02-26 (×2): 20 mg via ORAL
  Filled 2018-02-24 (×2): qty 2
  Filled 2018-02-24 (×2): qty 1

## 2018-02-24 MED ORDER — HYDROMORPHONE HCL 1 MG/ML IJ SOLN: 0.5000 mg | INTRAMUSCULAR | Status: DC | PRN

## 2018-02-24 MED ORDER — CARVEDILOL 12.5 MG PO TABS
12.5000 mg | ORAL_TABLET | Freq: Two times a day (BID) | ORAL | Status: DC
  Administered 2018-02-24 – 2018-02-26 (×4): 12.5 mg via ORAL
  Filled 2018-02-24 (×4): qty 1

## 2018-02-24 MED ORDER — LIDOCAINE 2% (20 MG/ML) 5 ML SYRINGE
INTRAMUSCULAR | Status: AC
  Filled 2018-02-24: qty 5

## 2018-02-24 MED ORDER — INSULIN ASPART 100 UNIT/ML ~~LOC~~ SOLN: 4.0000 [IU] | Freq: Three times a day (TID) | SUBCUTANEOUS | Status: DC

## 2018-02-24 MED ORDER — METHOCARBAMOL 500 MG PO TABS: 500.0000 mg | ORAL_TABLET | Freq: Four times a day (QID) | ORAL | Status: DC | PRN

## 2018-02-24 MED ORDER — BISACODYL 10 MG RE SUPP: 10.0000 mg | Freq: Every day | RECTAL | Status: DC | PRN

## 2018-02-24 MED ORDER — ONDANSETRON HCL 4 MG/2ML IJ SOLN
4.0000 mg | Freq: Four times a day (QID) | INTRAMUSCULAR | Status: DC | PRN
  Administered 2018-02-24: 4 mg via INTRAVENOUS
  Filled 2018-02-24: qty 2

## 2018-02-24 MED ORDER — FENTANYL CITRATE (PF) 100 MCG/2ML IJ SOLN
INTRAMUSCULAR | Status: AC
  Administered 2018-02-24: 50 ug
  Filled 2018-02-24: qty 2

## 2018-02-24 MED ORDER — NITROGLYCERIN 0.4 MG SL SUBL: 0.4000 mg | SUBLINGUAL_TABLET | SUBLINGUAL | Status: DC | PRN

## 2018-02-24 MED ORDER — ASPIRIN EC 81 MG PO TBEC
81.0000 mg | DELAYED_RELEASE_TABLET | Freq: Every day | ORAL | Status: DC
  Administered 2018-02-24 – 2018-02-26 (×3): 81 mg via ORAL
  Filled 2018-02-24 (×3): qty 1

## 2018-02-24 MED ORDER — LOSARTAN POTASSIUM 50 MG PO TABS
50.0000 mg | ORAL_TABLET | Freq: Every day | ORAL | Status: DC
  Administered 2018-02-25 – 2018-02-26 (×2): 50 mg via ORAL
  Filled 2018-02-24 (×2): qty 1

## 2018-02-24 MED ORDER — VANCOMYCIN HCL IN DEXTROSE 1-5 GM/200ML-% IV SOLN
1000.0000 mg | Freq: Two times a day (BID) | INTRAVENOUS | Status: AC
  Administered 2018-02-24: 1000 mg via INTRAVENOUS
  Filled 2018-02-24: qty 200

## 2018-02-24 MED ORDER — PROPOFOL 500 MG/50ML IV EMUL
INTRAVENOUS | Status: DC | PRN
  Administered 2018-02-24: 50 ug/kg/min via INTRAVENOUS

## 2018-02-24 MED ORDER — LEVOTHYROXINE SODIUM 50 MCG PO TABS
50.0000 ug | ORAL_TABLET | Freq: Every day | ORAL | Status: DC
  Administered 2018-02-25 – 2018-02-26 (×2): 50 ug via ORAL
  Filled 2018-02-24 (×2): qty 1

## 2018-02-24 MED ORDER — METHOCARBAMOL 1000 MG/10ML IJ SOLN
500.0000 mg | Freq: Four times a day (QID) | INTRAVENOUS | Status: DC | PRN
  Filled 2018-02-24: qty 5

## 2018-02-24 MED ORDER — OXYCODONE HCL 5 MG PO TABS: 10.0000 mg | ORAL_TABLET | ORAL | Status: DC | PRN

## 2018-02-24 MED ORDER — ONDANSETRON HCL 4 MG PO TABS
4.0000 mg | ORAL_TABLET | Freq: Four times a day (QID) | ORAL | Status: DC | PRN
  Administered 2018-02-26: 4 mg via ORAL
  Filled 2018-02-24: qty 1

## 2018-02-24 MED ORDER — ROSUVASTATIN CALCIUM 20 MG PO TABS
20.0000 mg | ORAL_TABLET | Freq: Every day | ORAL | Status: DC
  Administered 2018-02-24 – 2018-02-26 (×3): 20 mg via ORAL
  Filled 2018-02-24: qty 1
  Filled 2018-02-24 (×2): qty 2
  Filled 2018-02-24: qty 1
  Filled 2018-02-24: qty 2
  Filled 2018-02-24: qty 1

## 2018-02-24 MED ORDER — DOCUSATE SODIUM 100 MG PO CAPS
100.0000 mg | ORAL_CAPSULE | Freq: Two times a day (BID) | ORAL | Status: DC
  Administered 2018-02-24 – 2018-02-26 (×5): 100 mg via ORAL
  Filled 2018-02-24 (×5): qty 1

## 2018-02-24 MED ORDER — METOCLOPRAMIDE HCL 5 MG PO TABS: 5.0000 mg | ORAL_TABLET | Freq: Three times a day (TID) | ORAL | Status: DC | PRN

## 2018-02-24 MED ORDER — LIDOCAINE 2% (20 MG/ML) 5 ML SYRINGE
INTRAMUSCULAR | Status: DC | PRN
  Administered 2018-02-24: 25 mg via INTRAVENOUS

## 2018-02-24 MED ORDER — ACETAMINOPHEN 325 MG PO TABS: 325.0000 mg | ORAL_TABLET | Freq: Four times a day (QID) | ORAL | Status: DC | PRN

## 2018-02-24 MED ORDER — 0.9 % SODIUM CHLORIDE (POUR BTL) OPTIME
TOPICAL | Status: DC | PRN
  Administered 2018-02-24: 1000 mL

## 2018-02-24 MED ORDER — SODIUM CHLORIDE 0.9 % IV SOLN
INTRAVENOUS | Status: DC
  Administered 2018-02-24: 14:00:00 via INTRAVENOUS

## 2018-02-24 MED ORDER — TORSEMIDE 20 MG PO TABS
80.0000 mg | ORAL_TABLET | Freq: Two times a day (BID) | ORAL | Status: DC
  Administered 2018-02-24 – 2018-02-25 (×3): 80 mg via ORAL
  Filled 2018-02-24 (×3): qty 4

## 2018-02-24 MED ORDER — PROPOFOL 10 MG/ML IV BOLUS
INTRAVENOUS | Status: DC | PRN
  Administered 2018-02-24: 20 mg via INTRAVENOUS

## 2018-02-24 MED ORDER — INSULIN PUMP
Freq: Three times a day (TID) | SUBCUTANEOUS | Status: DC
  Administered 2018-02-24 – 2018-02-26 (×8): via SUBCUTANEOUS
  Filled 2018-02-24: qty 1

## 2018-02-24 MED ORDER — MIDAZOLAM HCL 2 MG/2ML IJ SOLN
INTRAMUSCULAR | Status: AC
  Administered 2018-02-24: 2 mg
  Filled 2018-02-24: qty 2

## 2018-02-24 MED ORDER — ROPIVACAINE HCL 5 MG/ML IJ SOLN
INTRAMUSCULAR | Status: DC | PRN
  Administered 2018-02-24: 30 mL via PERINEURAL
  Administered 2018-02-24: 10 mL via PERINEURAL

## 2018-02-24 MED ORDER — OXYCODONE HCL 5 MG PO TABS: 5.0000 mg | ORAL_TABLET | ORAL | Status: DC | PRN

## 2018-02-24 SURGICAL SUPPLY — 31 items
BLADE SURG 21 STRL SS (BLADE) ×2 IMPLANT
BNDG COHESIVE 4X5 TAN STRL (GAUZE/BANDAGES/DRESSINGS) ×2 IMPLANT
BNDG COHESIVE 6X5 TAN STRL LF (GAUZE/BANDAGES/DRESSINGS) ×2 IMPLANT
BNDG ESMARK 4X9 LF (GAUZE/BANDAGES/DRESSINGS) IMPLANT
BNDG GAUZE ELAST 4 BULKY (GAUZE/BANDAGES/DRESSINGS) ×2 IMPLANT
COVER SURGICAL LIGHT HANDLE (MISCELLANEOUS) ×2 IMPLANT
COVER WAND RF STERILE (DRAPES) ×2 IMPLANT
DRAPE U-SHAPE 47X51 STRL (DRAPES) ×2 IMPLANT
DRSG ADAPTIC 3X8 NADH LF (GAUZE/BANDAGES/DRESSINGS) IMPLANT
DRSG EMULSION OIL 3X3 NADH (GAUZE/BANDAGES/DRESSINGS) ×2 IMPLANT
DRSG PAD ABDOMINAL 8X10 ST (GAUZE/BANDAGES/DRESSINGS) ×2 IMPLANT
DURAPREP 26ML APPLICATOR (WOUND CARE) ×2 IMPLANT
ELECT REM PT RETURN 9FT ADLT (ELECTROSURGICAL) ×2
ELECTRODE REM PT RTRN 9FT ADLT (ELECTROSURGICAL) ×1 IMPLANT
GAUZE SPONGE 4X4 12PLY STRL (GAUZE/BANDAGES/DRESSINGS) ×2 IMPLANT
GLOVE BIOGEL PI IND STRL 9 (GLOVE) ×1 IMPLANT
GLOVE BIOGEL PI INDICATOR 9 (GLOVE) ×1
GLOVE SURG ORTHO 9.0 STRL STRW (GLOVE) ×2 IMPLANT
GOWN STRL REUS W/ TWL XL LVL3 (GOWN DISPOSABLE) ×2 IMPLANT
GOWN STRL REUS W/TWL XL LVL3 (GOWN DISPOSABLE) ×2
KIT BASIN OR (CUSTOM PROCEDURE TRAY) ×2 IMPLANT
KIT TURNOVER KIT B (KITS) ×2 IMPLANT
MANIFOLD NEPTUNE II (INSTRUMENTS) ×2 IMPLANT
NEEDLE 22X1 1/2 (OR ONLY) (NEEDLE) IMPLANT
NS IRRIG 1000ML POUR BTL (IV SOLUTION) ×2 IMPLANT
PACK ORTHO EXTREMITY (CUSTOM PROCEDURE TRAY) ×2 IMPLANT
PAD ABD 8X10 STRL (GAUZE/BANDAGES/DRESSINGS) ×2 IMPLANT
PAD ARMBOARD 7.5X6 YLW CONV (MISCELLANEOUS) ×2 IMPLANT
SUT ETHILON 2 0 PSLX (SUTURE) ×2 IMPLANT
SYR CONTROL 10ML LL (SYRINGE) IMPLANT
TOWEL OR 17X26 10 PK STRL BLUE (TOWEL DISPOSABLE) ×2 IMPLANT

## 2018-02-24 NOTE — Progress Notes (Addendum)
Paged by short stay staff RN concerning patient's CBG on admission this am. Patient is on Omnipod insulin pump per staff RN.  Dr. Bubba Camp had instructed patient to reduce basal rates to 1.24 units/hr. Prior to surgery. CBG on admission to short stay is 312 mg/dl. Patient had given herself 12 units bolus of insulin at 7:30 am.   Recommend that anesthesia know that patient had bolused herself 12 units insulin this am and that CBGs should be checked every hour. With patient's history of kidney issues, recommend checking trend of patient's blood sugars before and during surgery. Will continue to monitor blood sugars while in the hospital.   Harvel Ricks RN BSN CDE Diabetes Coordinator Pager: [phone]  8am-5pm

## 2018-02-24 NOTE — Progress Notes (Signed)
Pt signed insulin pump contract.

## 2018-02-24 NOTE — Progress Notes (Signed)
Meal coverage insulin D/c per DM coordinator.

## 2018-02-24 NOTE — H&P (Signed)
Susan [name] is an 67 y.o. female.   Chief Complaint: ulceration right foot great and second toes HPI: Patient is a 67 year old woman who presents in follow-up for venous insufficiency bilateral lower extremities.  Patient states that the ulceration and odor from the great toe and second toe has gotten worse.  Patient does have a history of heart failure she states she is taking 80 mg of torsemide twice a day.  Past Medical History:  Diagnosis Date  . Acute MI ([place]) 1999; 2007  . Anemia    hx  . Anginal pain ([place])   . Anxiety   . ARF (acute renal failure) ([place]) 06/2017   [facility]  . Arthritis    "generalized" (03/15/2014)  . CAD (coronary artery disease)    MI in 2000 - MI  2007 - treated bare metal stent (no nuclear since then as 9/11)  . Carotid artery disease ([place])   . CHF (congestive heart failure) ([place])   . Chronic diastolic heart failure (HCC)    a) ECHO (08/2013) EF 55-60% and RV function nl b) RHC (08/2013) RA 4, RV 30/5/7, PA 25/10 (16), PCWP 7, Fick CO/CI 6.3/2.7, PVR 1.5 WU, PA 61 and 66%  . Daily headache    "~ every other day; since I fell in June" (03/15/2014)  . Depression   . Dyslipidemia   . Dyspnea   . Exertional shortness of breath   . HTN (hypertension)   . Hypothyroidism   . Neuropathy   . Obesity   . Osteoarthritis   . Peripheral neuropathy   . PONV (postoperative nausea and vomiting)   . RBBB (right bundle branch block)    Old  . Stroke [facility])    mini strokes  . Syncope    likely due to low blood sugar  . Tachycardia    Sinus tachycardia  . Type II diabetes mellitus (HCC)    Type II  . Urinary incontinence   . Venous insufficiency     Past Surgical History:  Procedure Laterality Date  . ABDOMINAL HYSTERECTOMY  1980's  . CATARACT EXTRACTION, BILATERAL Bilateral ?2013  . COLONOSCOPY W/ POLYPECTOMY    . CORONARY ANGIOPLASTY WITH STENT PLACEMENT  1999; 2007   "1 + 1"  . EYE SURGERY Bilateral    lazer  . KNEE ARTHROSCOPY Left  10/25/2006  . RIGHT HEART CATH N/A 07/24/2017   Procedure: RIGHT HEART CATH;  Surgeon: Jolaine Artist, MD;  Location: [place] CV LAB;  Service: Cardiovascular;  Laterality: N/A;  . RIGHT HEART CATHETERIZATION N/A 09/22/2013   Procedure: RIGHT HEART CATH;  Surgeon: Jolaine Artist, MD;  Location: [facility] CATH LAB;  Service: Cardiovascular;  Laterality: N/A;  . SHOULDER ARTHROSCOPY WITH OPEN ROTATOR CUFF REPAIR Right 03/14/2014   Procedure: RIGHT SHOULDER ARTHROSCOPY WITH BICEPS RELEASE, OPEN SUBSCAPULA REPAIR, OPEN SUPRASPINATUS REPAIR.;  Surgeon: Meredith Pel, MD;  Location: [place];  Service: Orthopedics;  Laterality: Right;  . TUBAL LIGATION  1970's    Family History  Problem Relation Age of Onset  . Heart attack Mother 45   Social History:  reports that she quit smoking about 20 years ago. Her smoking use included cigarettes. She has a 96.00 pack-year smoking history. She has never used smokeless tobacco. She reports that she drank alcohol. She reports that she does not use drugs.  Allergies:  Allergies  Allergen Reactions  . Codeine Nausea And Vomiting    No medications prior to admission.    No results found  for this or any previous visit (from the past 48 hour(s)). No results found.  Review of Systems  All other systems reviewed and are negative.   There were no vitals taken for this visit. Physical Exam  Patient is alert, oriented, no adenopathy, well-dressed, normal affect, normal respiratory effort. Examination patient has a palpable dorsalis pedis pulse on the left right foot this is not palpable due to swelling the Doppler was used and patient does have a triphasic dorsalis pedis and posterior tibial pulse she has massive venous stasis swelling in both legs her dressings slid down her legs.  Her calves measure 52 cm in circumference.  There are no ulcers there is some wrinkling in the calf secondary to the compression wraps.  Examination of the great toe she now  has a necrotic ulcer over the lateral border of the right great toe with exposed bone and tendon.  There is no ascending cellulitis patient also has a necrotic tissue with exposed bone on the second toe as well there is redness of both the great toe and second toe.  Patient's hemoglobin A1c is elevated at 9.3 her albumin shows severe protein caloric malnutrition with it ranging from 2.1-2.6. Assessment/Plan 1. Acute osteomyelitis of toe, right ([place])   2. Idiopathic chronic venous hypertension of both lower extremities with ulcer and inflammation ([place])   3. Severe protein-calorie malnutrition ([place])   4. Diabetic polyneuropathy associated with type 2 diabetes mellitus ([place])   5. Body mass index 45.0-49.9, adult ([place])   6. Morbid obesity ([place])     Plan: Plan: Due to the necrotic ulceration and exposed bone of the great toe and second toe patient will need to proceed with an amputation of the great toe and second toe.  We will reapply the Dynaflex wraps to both legs.  Plan for overnight observation with elevation and minimize weightbearing on the right lower extremity after surgery.  Risks and benefits were discussed including risk of the wound not healing.   Newt Minion, MD 02/24/2018, 7:00 AM

## 2018-02-24 NOTE — Progress Notes (Signed)
Orthopedic Tech Progress Note Patient Details:  Susan [name] 03/06/1951 [id number]  Ortho Devices Type of Ortho Device: Postop shoe/boot Ortho Device/Splint Location: rle Ortho Device/Splint Interventions: Application   Post Interventions Patient Tolerated: Well Instructions Provided: Care of device   Susan [name] 02/24/2018, 1:40 PM

## 2018-02-24 NOTE — Progress Notes (Signed)
Spoke with patient following surgery. Patient has an Omnipod DASH insulin pump that she has just recently received. Patient has had diabetes for about 10 years. Sees Dr. [name] for her diabetes care. Patient states that she had some eye surgery yesterday and will need glasses in order to read her pump. Husband will get those for her at home.  Omnipod DASH basal insulin rates: 1.25 units per hour = 30 units total/day. Boluses are based on sliding scale per pump. Does not cover CHO with insulin on pump at this time. Patient states that she does take NPH insulin 10 units every am if blood sugar >200 mg/dl.  Spoke with staff RN about speaking with physician about getting an order for the NPH.   Will continue to monitor blood sugars while in the hospital.   Harvel Ricks RN BSN CDE Diabetes Coordinator Pager: [phone]  8am-5pm

## 2018-02-24 NOTE — Progress Notes (Signed)
Pt. Revealed  that she had an injection of 10U NPH at 0730 due to increased blood sugar this a.m. at home. Blood sugar was 321 at home, 312 arrival to hosp., & then 279 at  0950.

## 2018-02-24 NOTE — Anesthesia Procedure Notes (Signed)
Anesthesia Regional Block: Popliteal block   Pre-Anesthetic Checklist: ,, timeout performed, Correct Patient, Correct Site, Correct Laterality, Correct Procedure, Correct Position, site marked, Risks and benefits discussed,  Surgical consent,  Pre-op evaluation,  At surgeon's request and post-op pain management  Laterality: Right  Prep: chloraprep       Needles:  Injection technique: Single-shot  Needle Type: Echogenic Stimulator Needle     Needle Length: 9cm  Needle Gauge: 21     Additional Needles:   Procedures:,,,, ultrasound used (permanent image in chart),,,,  Narrative:  Start time: 02/24/2018 9:55 AM End time: 02/24/2018 10:03 AM Injection made incrementally with aspirations every 5 mL.  Performed by: Personally  Anesthesiologist: Lidia Collum, MD  Additional Notes: Monitors applied. Injection made in 5cc increments. No resistance to injection. Good needle visualization. Patient tolerated procedure well.

## 2018-02-24 NOTE — Progress Notes (Signed)
RN spoke to DM coordinator Harvel Ricks, and told her about pt having an insulin pump and how I should proceed. She told me to get the insulin pump order set placed by the MD and not to give and SS insulin coverage because the pump will cover her. RN got verbal order from Sharol Given, MD to place insulin pump order set. Pt CBG when she got on the floor at 1300 was 198. No action taken yet.

## 2018-02-24 NOTE — Progress Notes (Signed)
RN spoke to Duda,MD and he does not feel comfortable giving patient extra insulin in regards to pt AM NPH insulin.

## 2018-02-24 NOTE — Progress Notes (Signed)
Report to Diabetic Nurse coord.Tillie Rung - included Omni pump- with basal rate 1.24 u/hr. that is current, last bolus per pump was 12U at 0730, Cbg on arrival was 312.   Follow up call with Tillie Rung came with recommendation regarding complete report to anesth., but also follow CBG closely. Diabetes Coordination will follow pt. as the day unfolds.

## 2018-02-24 NOTE — Transfer of Care (Signed)
Immediate Anesthesia Transfer of Care Note  Patient: Susan [name]  Procedure(s) Performed: RIGHT FOOT GREAT TOE AND 2ND TOE AMPUTATION (Right )  Patient Location: PACU  Anesthesia Type:MAC  Level of Consciousness: drowsy  Airway & Oxygen Therapy: Patient Spontanous Breathing and Patient connected to face mask oxygen  Post-op Assessment: Report given to RN and Post -op Vital signs reviewed and stable  Post vital signs: Reviewed and stable  Last Vitals:  Vitals Value Taken Time  BP    Temp    Pulse 78 02/24/2018 11:12 AM  Resp    SpO2 100 % 02/24/2018 11:12 AM  Vitals shown include unvalidated device data.  Last Pain:  Vitals:   02/24/18 0943  TempSrc:   PainSc: 7       Patients Stated Pain Goal: 2 (06/25/41 8887)  Complications: No apparent anesthesia complications

## 2018-02-24 NOTE — Anesthesia Procedure Notes (Signed)
Anesthesia Regional Block: Adductor canal block   Pre-Anesthetic Checklist: ,, timeout performed, Correct Patient, Correct Site, Correct Laterality, Correct Procedure, Correct Position, site marked, Risks and benefits discussed,  Surgical consent,  Pre-op evaluation,  At surgeon's request and post-op pain management  Laterality: Right  Prep: chloraprep       Needles:  Injection technique: Single-shot  Needle Type: Echogenic Stimulator Needle     Needle Length: 9cm  Needle Gauge: 21     Additional Needles:   Procedures:,,,, ultrasound used (permanent image in chart),,,,  Narrative:  Start time: 02/24/2018 10:03 AM End time: 02/24/2018 10:10 AM Injection made incrementally with aspirations every 5 mL.  Performed by: Personally  Anesthesiologist: Lidia Collum, MD  Additional Notes: Monitors applied. Injection made in 5cc increments. No resistance to injection. Good needle visualization. Patient tolerated procedure well.

## 2018-02-24 NOTE — Anesthesia Preprocedure Evaluation (Signed)
Anesthesia Evaluation  Patient identified by MRN, date of birth, ID band Patient awake    Reviewed: Allergy & Precautions, NPO status , Patient's Chart, lab work & pertinent test results, reviewed documented beta blocker date and time   History of Anesthesia Complications (+) PONV and history of anesthetic complications  Airway Mallampati: III  TM Distance: >3 FB Neck ROM: Full    Dental no notable dental hx.    Pulmonary shortness of breath, COPD,  COPD inhaler, former smoker,    Pulmonary exam normal        Cardiovascular hypertension, Pt. on home beta blockers and Pt. on medications + angina + CAD, + Past MI, + Peripheral Vascular Disease and +CHF  Normal cardiovascular exam     Neuro/Psych PSYCHIATRIC DISORDERS Anxiety Depression  Neuromuscular disease CVA    GI/Hepatic   Endo/Other  diabetes, Poorly Controlled, Insulin DependentHypothyroidism Morbid obesity  Renal/GU Renal InsufficiencyRenal disease     Musculoskeletal  (+) Arthritis , Osteoarthritis,    Abdominal (+) + obese,   Peds  Hematology  (+) anemia ,   Anesthesia Other Findings   Reproductive/Obstetrics                           Anesthesia Physical Anesthesia Plan  ASA: IV  Anesthesia Plan: Regional   Post-op Pain Management:    Induction:   PONV Risk Score and Plan: 3 and Ondansetron and Treatment may vary due to age or medical condition  Airway Management Planned: Natural Airway, Nasal Cannula and Simple Face Mask  Additional Equipment:   Intra-op Plan:   Post-operative Plan:   Informed Consent: I have reviewed the patients History and Physical, chart, labs and discussed the procedure including the risks, benefits and alternatives for the proposed anesthesia with the patient or authorized representative who has indicated his/her understanding and acceptance.     Plan Discussed with:   Anesthesia Plan  Comments:         Anesthesia Quick Evaluation

## 2018-02-24 NOTE — Progress Notes (Signed)
PT Cancellation Note  Patient Details Name: Susan [name] MRN: [id number] DOB: [birth year]   Cancelled Treatment:    Reason Eval/Treat Not Completed: Pain limiting ability to participate  Pt reporting her eyes were hurting from a procedure yesterday and requested to hold PT until tomorrow. Will follow up as schedule allows.   Leighton Ruff, PT, DPT  Acute Rehabilitation Services  Pager: [phone] Office: [phone]   Rudean Hitt 02/24/2018, 3:46 PM

## 2018-02-24 NOTE — Anesthesia Procedure Notes (Signed)
Procedure Name: MAC Date/Time: 02/24/2018 10:42 AM Performed by: Colin Benton, CRNA Pre-anesthesia Checklist: Patient identified, Emergency Drugs available, Suction available and Patient being monitored Patient Re-evaluated:Patient Re-evaluated prior to induction Oxygen Delivery Method: Simple face mask Induction Type: IV induction Placement Confirmation: positive ETCO2 Dental Injury: Teeth and Oropharynx as per pre-operative assessment

## 2018-02-24 NOTE — Op Note (Signed)
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  SURGEON:  Newt Minion, MD  PHYSICIAN ASSISTANT:None ANESTHESIA:   General  PREOPERATIVE INDICATIONS:  [name] is a  67 y.o. female with a diagnosis of Osteomyelitis, Abscess Right Foot who failed conservative measures and elected for surgical management.    The risks benefits and alternatives were discussed with the patient preoperatively including but not limited to the risks of infection, bleeding, nerve injury, cardiopulmonary complications, the need for revision surgery, among others, and the patient was willing to proceed.  OPERATIVE IMPLANTS: Compression wraps bilateral lower extremities  @ENCIMAGES @  OPERATIVE FINDINGS: Good petechial bleeding no abscess at the MTP joints  OPERATIVE PROCEDURE: Patient was brought the operating room after undergoing a regional anesthesia.  After adequate levels of anesthesia were obtained patient's right lower extremity was prepped using DuraPrep draped into a sterile field a timeout was called.  A fishmouth incision was made just distal to the MTP joint proximal to the ulcerative tissue.  The great toe and second toe were amputated through the MTP joints.  Electrocautery was used for hemostasis the wound was irrigated with normal saline there was no abscess no necrotic tissue at the amputation site.  2-0 nylon was used for wound closure sterile dressing was applied.  Patient had massive venous insufficiency bilateral lower extremities with her calf greater than 50 cm in circumference.  A level was applied to both lower extremities followed by a Coban wrap.  Patient was taken   DISCHARGE PLANNING:  Antibiotic duration: Continue antibiotics 24 hours  postoperatively  Weightbearing: Touchdown weightbearing on the right  Pain medication: Opioid pathway ordered  Dressing care/ Wound VAC: Keep dressing intact for 1 week  Ambulatory devices: Walker  Discharge to: Home after observation.  Follow-up: In the office 1 week post operative.

## 2018-02-25 ENCOUNTER — Other Ambulatory Visit: Payer: Self-pay | Admitting: Licensed Clinical Social Worker

## 2018-02-25 ENCOUNTER — Encounter (HOSPITAL_COMMUNITY): Payer: Self-pay | Admitting: Orthopedic Surgery

## 2018-02-25 DIAGNOSIS — M86171 Other acute osteomyelitis, right ankle and foot: Secondary | ICD-10-CM | POA: Diagnosis not present

## 2018-02-25 LAB — GLUCOSE, CAPILLARY
Glucose-Capillary: 77 mg/dL (ref 70–99)
Glucose-Capillary: 131 mg/dL — ABNORMAL HIGH (ref 70–99)
Glucose-Capillary: 200 mg/dL — ABNORMAL HIGH (ref 70–99)
Glucose-Capillary: 226 mg/dL — ABNORMAL HIGH (ref 70–99)

## 2018-02-25 MED ORDER — HYDROCODONE-ACETAMINOPHEN 5-325 MG PO TABS: 1.0000 | ORAL_TABLET | ORAL | 0 refills | Status: DC | PRN

## 2018-02-25 NOTE — Care Management Note (Signed)
Case Management Note  Patient Details  Name: Susan [name] MRN: [id number] Date of Birth: Nov 16, 1950  Subjective/Objective:  67 yr old female s/p right foot great toe and 2nd toe amputation.                  Action/Plan: Case manager spoke with patient concerning discharge plan and DME. CM discussed with patient that therapist has recommended she go to SNF for shortterm rehab. Patient is adamant that she does not want to and she will go home. Choice for [facility] was offered, Patient has used [facility] in the past and will do so now. Patient says that her husband and son will assist her at discharge.    Expected Discharge Date:  02/25/18               Expected Discharge Plan:  [place]  In-House Referral:  NA  Discharge planning Services  CM Consult  Post Acute Care Choice:  Home Health, Durable Medical Equipment Choice offered to:  Patient  DME Arranged:  Wheelchair manual DME Agency:  [place]:  PT, Nurse's Aide [facility] Agency:  [facility]  Status of Service:  Completed, signed off  If discussed at [place] of Stay Meetings, dates discussed:    Additional Comments:  Ninfa Meeker, RN 02/25/2018, 1:51 PM

## 2018-02-25 NOTE — Evaluation (Addendum)
Physical Therapy Evaluation Patient Details Name: Susan [name] MRN: [id number] DOB: April 12, 1951 Today's Date: 02/25/2018   History of Present Illness  Susan [name] is an 67 y.o. female who was admitted 02/24/2018 with a chief complaint of osteomyelitis right foot great toe and second toe, with a final diagnosis of Osteomyelitis, Abscess Right Foot.   Now s/p R great and second toe amputation.  PMH positive for CHF, CAD, DM, hypothyroidism, CKD and anemia.   Clinical Impression  Patient presents with decreased independence and safety with mobility due to general weakness, history of peripheral neuropathy, decreased vision with recent eye surgery and decreased balance with new weight bearing restrictions she is unable to maintain.  Currently needs mod to max A to stand and min to mod A to ambulate short distances not maintaining TDWB on R LE.  She is refusing STSNF so will need HHPT, HHaide, wheelchair for home (though reports won't fit into most doorways in her home.)  Feel she is 100% fall risk as has previously fallen, has decreased sensation and vision and weight bearing restrictions with painful R foot.  May benefit slightly from one more hospital day for further safety education and mobility practice.     Follow Up Recommendations Supervision/Assistance - 24 hour;Home health PT(patient has refused SNF)    Equipment Recommendations  Wheelchair cushion (measurements PT);Wheelchair (measurements PT)    Recommendations for Other Services       Precautions / Restrictions Precautions Precautions: Fall Required Braces or Orthoses: Other Brace/Splint Other Brace/Splint: post op shoe R foot Restrictions Weight Bearing Restrictions: Yes RLE Weight Bearing: Touchdown weight bearing      Mobility  Bed Mobility Overal bed mobility: Needs Assistance Bed Mobility: Supine to Sit     Supine to sit: Supervision;HOB elevated     General bed mobility comments: cues for technique, use of rail,  with effort  Transfers Overall transfer level: Needs assistance Equipment used: Rolling walker (2 wheeled) Transfers: Sit to/from Stand Sit to Stand: Max assist         General transfer comment: two attempts to achieve standing, lifting help and steadying help once standing, cues for hand placement, unable to maintain TDWB R LE  Ambulation/Gait Ambulation/Gait assistance: Mod assist;Min assist Gait Distance (Feet): 12 Feet Assistive device: Rolling walker (2 wheeled) Gait Pattern/deviations: Step-to pattern;Decreased stride length     General Gait Details: assist for balance, unable to maintain TDWB R LE despite cues and demonstration throughout.  unstable at times requiring up to mod support  Stairs            Wheelchair Mobility    Modified Rankin (Stroke Patients Only)       Balance Overall balance assessment: Needs assistance;History of Falls   Sitting balance-Leahy Scale: Fair     Standing balance support: Bilateral upper extremity supported Standing balance-Leahy Scale: Poor Standing balance comment: assist for balance with UE support, mod A for gaining balance once standing,                              Pertinent Vitals/Pain Pain Assessment: 0-10 Pain Score: 3  Pain Location: R foot between ankle and foot Pain Descriptors / Indicators: Sore Pain Intervention(s): Monitored during session;Repositioned    Home Living Family/patient expects to be discharged to:: Private residence Living Arrangements: Spouse/significant other Available Help at Discharge: Available PRN/intermittently(husband works) Type of Home: House Home Access: Ramped entrance     Home Layout: One level Home  Equipment: Grab bars - tub/shower;Walker - 2 wheels;Walker - 4 wheels;Cane - single point      Prior Function Level of Independence: Needs assistance   Gait / Transfers Assistance Needed: was able to perform self care prior to admission  ADL's / Homemaking  Assistance Needed: was trying to do cooking and cleaning  Comments: Pt typically ambulates with a RW and can perform self care.     Hand Dominance        Extremity/Trunk Assessment   Upper Extremity Assessment Upper Extremity Assessment: Generalized weakness    Lower Extremity Assessment Lower Extremity Assessment: RLE deficits/detail;LLE deficits/detail RLE Deficits / Details: AROM limited knee flexion in supine about 90 in sitting, strength 3/5 hip flexion, 4-/5 knee extension RLE Sensation: history of peripheral neuropathy;decreased light touch LLE Deficits / Details: AROM limited knee flexion in supine about 90 in sitting, strength 3-/5 hip flexion, 4-/5 knee extension LLE Sensation: history of peripheral neuropathy;decreased light touch       Communication   Communication: No difficulties  Cognition Arousal/Alertness: Awake/alert Behavior During Therapy: WFL for tasks assessed/performed Overall Cognitive Status: Within Functional Limits for tasks assessed                                        General Comments General comments (skin integrity, edema, etc.): patient wanting to drive, states just had eye surgery earlier this week and not able to see clearly yet; educated with just having R foot and eye surgery not to drive.  Noted profore vs unna boot dressings on both legs     Exercises     Assessment/Plan    PT Assessment Patient needs continued PT services  PT Problem List Decreased balance;Pain;Decreased activity tolerance;Decreased strength;Decreased mobility;Decreased safety awareness;Decreased knowledge of precautions;Decreased knowledge of use of DME       PT Treatment Interventions DME instruction;Therapeutic activities;Patient/family education;Therapeutic exercise;Gait training;Functional mobility training;Balance training;Wheelchair mobility training    PT Goals (Current goals can be found in the Care Plan section)  Acute Rehab PT  Goals Patient Stated Goal: to go home PT Goal Formulation: With patient Time For Goal Achievement: 03/04/18 Potential to Achieve Goals: Fair Additional Goals Additional Goal #1: patient to propel w/c in home environment with S x 50'.    Frequency Min 3X/week   Barriers to discharge        Co-evaluation               AM-PAC PT "6 Clicks" Daily Activity  Outcome Measure Difficulty turning over in bed (including adjusting bedclothes, sheets and blankets)?: A Little Difficulty moving from lying on back to sitting on the side of the bed? : A Lot Difficulty sitting down on and standing up from a chair with arms (e.g., wheelchair, bedside commode, etc,.)?: Unable Help needed moving to and from a bed to chair (including a wheelchair)?: A Lot Help needed walking in hospital room?: A Little Help needed climbing 3-5 steps with a railing? : Total 6 Click Score: 12    End of Session Equipment Utilized During Treatment: Gait belt Activity Tolerance: Patient tolerated treatment well Patient left: with call bell/phone within reach;with chair alarm set;in chair   PT Visit Diagnosis: Other abnormalities of gait and mobility (R26.89);History of falling (Z91.81)    Time: 1610-9604 PT Time Calculation (min) (ACUTE ONLY): 31 min   Charges:   PT Evaluation $PT Eval High Complexity: 1 High PT  Treatments $Gait Training: 8-22 mins        Magda Kiel, [place] [phone] 02/25/2018   Reginia Naas 02/25/2018, 9:43 AM

## 2018-02-25 NOTE — Progress Notes (Signed)
    Durable Medical Equipment  (From admission, onward)         Start     Ordered   02/25/18 1342  For home use only DME standard manual wheelchair with seat cushion  Once    Comments:  Patient suffers from s/p amputation right great to and 2nd toe, TDWB,which impairs their ability to perform daily activities like ambulatingin the home.  A cane will not resolve  issue with performing activities of daily living. A wheelchair will allow patient to safely perform daily activities. Patient can safely propel the wheelchair in the home or has a caregiver who can provide assistance.  Accessories: elevating leg rests (ELRs), wheel locks, extensions and anti-tippers.   02/25/18 1345

## 2018-02-25 NOTE — Progress Notes (Signed)
PT called RN and had concerns about pt going home today. This RN spoke with patient about possibility of going to SNF but pt continues to refuse this. I explained to her that she needs to be safe when she goes home and that she is going to need consistent help. Pt states that her son could be there to help her through out the week until she goes to her follow up appointment. RN stressed importance of maintaining her weight bearing status to that foot and potentials of what could happen if she did not or if she were to go home and have a fall. Pt expressed understanding but still wishes to go home. Spoke with Dr. Sharol Given and he is okay with pt staying another night so that PT can work with her again before going home. Pt made aware and agreeable.

## 2018-02-25 NOTE — Patient Outreach (Signed)
Assessment:  CSW spoke via phone with client. CSW verified client identity. CSW received verbal permission from client for CSW to speak with client about client needs. CSW spoke with client about client care plan. CSW encouraged client to continue to use relaxation techniques of choice in next 30 days to help [place] in managing anxiety symptoms or depression symptoms experienced by client.   CSW encouraged client to continue to visit with family and friends as a way to relax. CSW encouraged client to watch old TV movies of her choice or spend time caring for her pet dog as ways to manage stress or depression symptoms.  Client said she recently underwent surgery. Client is currently at [facility] receiving care. Client is receiving physical therapy support at [facility]. Client is receiving nursing support at [facility].  Client is hoping to discharge soon from the hospital to return to her home with needed supports in place. Client has support from her spouse.   CSW thanked Nhu for phone call with CSW on 02/25/18. CSW encouraged client to call CSW as needed to discuss social work needs of client.    Plan:  Client to continue to use relaxation techniques of choice in next 30 days to help [place] in managing anxiety symptoms or depression symptoms experienced by client.    CSW to call client in 4 weeks to assess client needs at that time  [name].Mahonri Seiden MSW, LCSW Licensed Clinical Social Worker [facility] Care Management [phone]

## 2018-02-25 NOTE — Progress Notes (Signed)
Patient ID: Susan [name], female   DOB: August 27, 1950, 67 y.o.   MRN: [id number] Patient states that she did not feel safe with therapy yesterday.  Plan for therapy today plan for discharge to home today after therapy.

## 2018-02-25 NOTE — Progress Notes (Signed)
CSW spoke with patient regarding SNF recommendation. As previous notes state, patient continues to refuse SNF. CSW processed with patient home life, transporting from car to home after her husband picks her up. Patient identifies need of a wheel chair to make it easier for transporting from car to ramp into her home. Otherwise patient reports she will use walker in home if her wheel chair will not fit in her narrow hallway.  Patient identifies son will be present at home while husband is at work.   CSW encouraged patient to consider SNF options, however patient reports being at a SNF in the past and says she would like to be in her home receiving therapy.   CSW notified RNCM of wheel chair need and home health services.   CSW signing off.   [place], [place]

## 2018-02-25 NOTE — Addendum Note (Signed)
Encounter addended by: Conrad Stoughton, NP on: 02/25/2018 2:58 PM  Actions taken: Follow-up modified, LOS modified

## 2018-02-25 NOTE — Anesthesia Postprocedure Evaluation (Signed)
Anesthesia Post Note  Patient: Susan [name]  Procedure(s) Performed: RIGHT FOOT GREAT TOE AND 2ND TOE AMPUTATION (Right )     Patient location during evaluation: PACU Anesthesia Type: Regional Level of consciousness: awake and alert Pain management: pain level controlled Vital Signs Assessment: post-procedure vital signs reviewed and stable Respiratory status: spontaneous breathing, nonlabored ventilation, respiratory function stable and patient connected to nasal cannula oxygen Cardiovascular status: stable and blood pressure returned to baseline Postop Assessment: no apparent nausea or vomiting Anesthetic complications: no    Last Vitals:  Vitals:   02/25/18 0821 02/25/18 1320  BP: (!) 176/73 (!) 152/71  Pulse: 89 89  Resp:    Temp:  36.5 C  SpO2: 92% 90%    Last Pain:  Vitals:   02/25/18 1320  TempSrc: Oral  PainSc:                  Lidia Collum

## 2018-02-25 NOTE — Discharge Summary (Signed)
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 female who was admitted 02/24/2018 with a chief complaint of osteomyelitis right foot great toe and second toe, with a final diagnosis of Osteomyelitis, Abscess Right Foot.  Patient was brought to the operating room on 02/24/2018 and underwent Procedure(s): RIGHT FOOT GREAT TOE AND 2ND TOE AMPUTATION.    Patient was given perioperative antibiotics:  Anti-infectives (From admission, onward)   Start     Dose/Rate Route Frequency Ordered Stop   02/24/18 2230  vancomycin (VANCOCIN) IVPB 1000 mg/200 mL premix     1,000 mg 200 mL/hr over 60 Minutes Intravenous Every 12 hours 02/24/18 1259 02/24/18 2240   02/24/18 1245  vancomycin (VANCOCIN) IVPB 1000 mg/200 mL premix  Status:  Discontinued     1,000 mg 200 mL/hr over 60 Minutes Intravenous Every 12 hours 02/24/18 1236 02/24/18 1259   02/24/18 1000  ceFAZolin (ANCEF) 3 g in dextrose 5 % 50 mL IVPB     3 g 100 mL/hr over 30 Minutes Intravenous To ShortStay Surgical 02/23/18 1256 02/24/18 1057   02/24/18 0800  vancomycin (VANCOCIN) 1,500 mg in sodium chloride 0.9 % 500 mL IVPB     1,500 mg 250 mL/hr over 120 Minutes Intravenous To ShortStay Surgical 02/23/18 1413 02/24/18 1200   02/23/18 0730  vancomycin (VANCOCIN) 1,500 mg in sodium chloride 0.9 % 500 mL IVPB  Status:  Discontinued     1,500 mg 250 mL/hr over 120 Minutes Intravenous To Surgery 02/23/18 0728 02/23/18 1411    .  Patient was given sequential compression devices, early ambulation, and aspirin for DVT prophylaxis.  Recent vital signs:  Patient Vitals for the  past 24 hrs:  BP Temp Temp src Pulse Resp SpO2 Height Weight  02/25/18 0539 139/61 98.6 F (37 C) Oral 80 16 100 % - -  02/24/18 1951 (!) 150/60 98 F (36.7 C) Oral 74 17 99 % - -  02/24/18 1258 138/74 98.3 F (36.8 C) Oral 83 18 94 % - -  02/24/18 1230 134/77 98 F (36.7 C) - 60 14 92 % - -  02/24/18 1215 136/71 - - 60 14 93 % - -  02/24/18 1200 135/76 - - 60 14 94 % - -  02/24/18 1145 (!) 148/80 - - 65 12 93 % - -  02/24/18 1130 129/78 - - 65 13 99 % - -  02/24/18 1115 130/65 (!) 97 F (36.1 C) - 78 12 100 % - -  02/24/18 0850 (!) 143/59 98.4 F (36.9 C) Oral 76 18 96 % 5\' 5"  (1.651 m) 124.7 kg  .  Recent laboratory studies: No results found.  Discharge Medications:   Allergies as of 02/25/2018      Reactions   Codeine Nausea And Vomiting      Medication List    STOP taking these medications   doxycycline 100 MG tablet Commonly known as:  VIBRA-TABS     TAKE these medications   albuterol 108 (90 Base) MCG/ACT inhaler Commonly known as:  PROVENTIL HFA;VENTOLIN HFA Inhale 2 puffs into the lungs every 6 (six) hours  as needed for wheezing or shortness of breath. Reported on 10/30/2015   aspirin EC 81 MG tablet Take 81 mg by mouth daily.   carvedilol 12.5 MG tablet Commonly known as:  COREG TAKE 1 TABLET BY MOUTH TWICE A DAY WITH A MEAL What changed:  See the new instructions.   cholecalciferol 1000 units tablet Commonly known as:  VITAMIN D Take 1,000 Units by mouth daily.   clopidogrel 75 MG tablet Commonly known as:  PLAVIX TAKE 1 TABLET BY MOUTH DAILY WITH BREAKFAST. What changed:  when to take this   escitalopram 20 MG tablet Commonly known as:  LEXAPRO Take 1 tablet (20 mg total) by mouth daily.   ferrous sulfate 325 (65 FE) MG tablet Take 325 mg by mouth daily with breakfast. Reported on 08/21/2015   HYDROcodone-acetaminophen 5-325 MG tablet Commonly known as:  NORCO/VICODIN Take 1 tablet by mouth every 4 (four) hours as needed for moderate pain.    insulin pump Soln Inject into the skin.   insulin regular 100 units/mL injection Commonly known as:  NOVOLIN R,HUMULIN R 3 (three) times daily before meals.   isosorbide dinitrate 10 MG tablet Commonly known as:  ISORDIL TAKE 1 TABLET BY MOUTH TWICE A DAY   levothyroxine 50 MCG tablet Commonly known as:  SYNTHROID, LEVOTHROID Take 1 tablet (50 mcg total) by mouth daily before breakfast.   losartan 50 MG tablet Commonly known as:  COZAAR Take 1 tablet (50 mg total) by mouth daily.   Magnesium Oxide 400 (240 Mg) MG Tabs TAKE 1 TABLET BY MOUTH EVERY DAY What changed:    how much to take  how to take this  when to take this   metolazone 5 MG tablet Commonly known as:  ZAROXOLYN Take 1 tablet (5 mg total) by mouth once a week.   metolazone 2.5 MG tablet Commonly known as:  ZAROXOLYN TAKE 1 TABLET BY MOUTH ONCE A WEEK.   nitroGLYCERIN 0.4 MG SL tablet Commonly known as:  NITROSTAT PLACE 1 TABLET (0.4 MG TOTAL) UNDER THE TONGUE EVERY 5 (FIVE) MINUTES AS NEEDED FOR CHEST PAIN.   ondansetron 4 MG tablet Commonly known as:  ZOFRAN Take 1 tablet (4 mg total) by mouth every 8 (eight) hours as needed for nausea or vomiting.   ONETOUCH VERIO test strip Generic drug:  glucose blood   pregabalin 150 MG capsule Commonly known as:  LYRICA Take 1 capsule (150 mg total) by mouth 2 (two) times daily.   rosuvastatin 20 MG tablet Commonly known as:  CRESTOR TAKE 1 TABLET BY MOUTH EVERY DAY   torsemide 20 MG tablet Commonly known as:  DEMADEX Take 80 mg by mouth 2 (two) times daily.   vitamin E 1000 UNIT capsule Generic drug:  vitamin E Take 1,000 Units by mouth daily.            Discharge Care Instructions  (From admission, onward)         Start     Ordered   02/25/18 0000  Touch down weight bearing    Question Answer Comment  Laterality right   Extremity Lower      02/25/18 0652          Diagnostic Studies: Nm Pulmonary Perf And Vent  Result Date:  02/14/2018 CLINICAL DATA:  67 year old female with acute shortness of breath and chest pain. EXAM: NUCLEAR MEDICINE VENTILATION - PERFUSION LUNG SCAN TECHNIQUE: Ventilation images were obtained in multiple projections using inhaled aerosol Tc-6m DTPA. Perfusion images were obtained  in multiple projections after intravenous injection of Tc-13m-MAA. RADIOPHARMACEUTICALS:  31.0 mCi of Tc-70m DTPA aerosol inhalation and 4.3 mCi Tc48m-MAA IV COMPARISON:  02/13/2018 chest radiograph FINDINGS: Ventilation: No significant ventilation abnormalities noted. Perfusion: No wedge shaped peripheral perfusion defects or ventilation/perfusion mismatches to suggest acute pulmonary embolism. IMPRESSION: 1. Normal perfusion.  No evidence of pulmonary emboli. Electronically Signed   By: Margarette Canada M.D.   On: 02/14/2018 11:32   Dg Chest Portable 1 View  Result Date: 02/13/2018 CLINICAL DATA:  Encounter for chest pain and SOB that began earlier today. Pain is on the left side, pt states normally it is intermittent but has been constant today. EXAM: PORTABLE CHEST 1 VIEW COMPARISON:  07/16/2017 FINDINGS: Cardiac silhouette is normal in size. No mediastinal or hilar masses. There are prominent bronchovascular markings most evident in the lower lungs. No convincing pneumonia or pulmonary edema. No pleural effusion or pneumothorax. Skeletal structures are grossly intact. IMPRESSION: No acute cardiopulmonary disease. Electronically Signed   By: Lajean Manes M.D.   On: 02/13/2018 14:09   Dg Foot Complete Right  Result Date: 02/13/2018 CLINICAL DATA:  Ulcer at great toe EXAM: RIGHT FOOT COMPLETE - 3+ VIEW COMPARISON:  10/01/2012 FINDINGS: No visible bone destruction within the great toe to suggest osteomyelitis. No fracture, subluxation or dislocation. Advanced degenerative changes in the midfoot and hindfoot. Large calcaneal spurs. Vascular calcifications. IMPRESSION: No acute bony abnormality. Note radiographic changes of  osteomyelitis. Electronically Signed   By: Rolm Baptise M.D.   On: 02/13/2018 17:27   Xr Foot Complete Left  Result Date: 02/10/2018 Radiographs left foot are negative.  No acute finding.  No sign of osteomyelitis great toe.   Patient benefited maximally from their hospital stay and there were no complications.     Disposition: Discharge disposition: 01-Home or Self Care      Discharge Instructions    Call MD / Call 911   Complete by:  As directed    If you experience chest pain or shortness of breath, CALL 911 and be transported to the hospital emergency room.  If you develope a fever above 101 F, pus (white drainage) or increased drainage or redness at the wound, or calf pain, call your surgeon's office.   Constipation Prevention   Complete by:  As directed    Drink plenty of fluids.  Prune juice may be helpful.  You may use a stool softener, such as Colace (over the counter) 100 mg twice a day.  Use MiraLax (over the counter) for constipation as needed.   Diet - low sodium heart healthy   Complete by:  As directed    Increase activity slowly as tolerated   Complete by:  As directed    Touch down weight bearing   Complete by:  As directed    Laterality:  right   Extremity:  Lower     Follow-up Information    Newt Minion, MD In 1 week.   Specialty:  Orthopedic Surgery Contact information: [address] [place] [address] [phone]            Signed: Newt Minion 02/25/2018, 6:52 AM

## 2018-02-26 ENCOUNTER — Inpatient Hospital Stay: Payer: PPO | Admitting: Family Medicine

## 2018-02-26 DIAGNOSIS — N179 Acute kidney failure, unspecified: Secondary | ICD-10-CM | POA: Diagnosis not present

## 2018-02-26 DIAGNOSIS — R2681 Unsteadiness on feet: Secondary | ICD-10-CM | POA: Diagnosis not present

## 2018-02-26 DIAGNOSIS — E1165 Type 2 diabetes mellitus with hyperglycemia: Secondary | ICD-10-CM | POA: Diagnosis not present

## 2018-02-26 DIAGNOSIS — N183 Chronic kidney disease, stage 3 (moderate): Secondary | ICD-10-CM | POA: Diagnosis not present

## 2018-02-26 DIAGNOSIS — R0602 Shortness of breath: Secondary | ICD-10-CM | POA: Diagnosis not present

## 2018-02-26 DIAGNOSIS — I5033 Acute on chronic diastolic (congestive) heart failure: Secondary | ICD-10-CM | POA: Diagnosis not present

## 2018-02-26 DIAGNOSIS — I2510 Atherosclerotic heart disease of native coronary artery without angina pectoris: Secondary | ICD-10-CM | POA: Diagnosis not present

## 2018-02-26 DIAGNOSIS — R269 Unspecified abnormalities of gait and mobility: Secondary | ICD-10-CM | POA: Diagnosis not present

## 2018-02-26 DIAGNOSIS — R262 Difficulty in walking, not elsewhere classified: Secondary | ICD-10-CM | POA: Diagnosis not present

## 2018-02-26 DIAGNOSIS — S98131A Complete traumatic amputation of one right lesser toe, initial encounter: Secondary | ICD-10-CM | POA: Diagnosis not present

## 2018-02-26 DIAGNOSIS — M6281 Muscle weakness (generalized): Secondary | ICD-10-CM | POA: Diagnosis not present

## 2018-02-26 DIAGNOSIS — M86171 Other acute osteomyelitis, right ankle and foot: Secondary | ICD-10-CM | POA: Diagnosis not present

## 2018-02-26 LAB — GLUCOSE, CAPILLARY
Glucose-Capillary: 168 mg/dL — ABNORMAL HIGH (ref 70–99)
Glucose-Capillary: 104 mg/dL — ABNORMAL HIGH (ref 70–99)

## 2018-02-26 NOTE — Discharge Summary (Signed)
  No change in patient's discharge status or medications patient stayed an extra day for physical therapy for gait training for safety with ambulation.

## 2018-02-26 NOTE — Progress Notes (Signed)
Pt given prescription on discharge instructions. Instructions gone over with her and husband present. All equipment and belongings gathered to be sent home.

## 2018-02-26 NOTE — Progress Notes (Signed)
Notified [facility] that patient will DC today and still needs WC delivered to room. Acknowledged.

## 2018-02-26 NOTE — Progress Notes (Signed)
PT Cancellation Note  Patient Details Name: Alezandra Egli MRN: [id number] DOB: [birth year]   Cancelled Treatment:    Reason Eval/Treat Not Completed: (P) Other (comment)(Pt sitting on [facility] and reports she needs more time will f/u later this am.  )   Cristela Blue 02/26/2018, 9:39 AM  Governor Rooks, PTA Acute Rehabilitation Services Pager [phone] Office [phone]

## 2018-02-26 NOTE — Progress Notes (Signed)
Physical Therapy Treatment Patient Details Name: Susan [name] MRN: [id number] DOB: 05/28/1950 Today's Date: 02/26/2018    History of Present Illness Susan [name] is an 66 y.o. female who was admitted 02/24/2018 with a chief complaint of osteomyelitis right foot great toe and second toe, with a final diagnosis of Osteomyelitis, Abscess Right Foot.   Now s/p R great and second toe amputation.  PMH positive for CHF, CAD, DM, hypothyroidism, CKD and anemia.     PT Comments    Pt seated on commode on arrival performed STS transfer with poor ability to maintain R TDWB.  Transferred patient to bed and opted for slide board transfer for improved safety with weight bearing.  Pt/ husband educated on WC mobility and management.  Pt is slow to understand but husband appears to have a better understanding of how to use WC.  Pt is to d/c home as she continues to refuse placement.  Informed [facility] to issue slide board as well.     Follow Up Recommendations  Supervision/Assistance - 24 hour;Home health PT(Pt has refused SNF despite education and encouragement.  )     Equipment Recommendations  Wheelchair cushion (measurements PT);Wheelchair (measurements PT)    Recommendations for Other Services       Precautions / Restrictions Precautions Precautions: Fall Required Braces or Orthoses: Other Brace/Splint Other Brace/Splint: post op shoe R foot Restrictions Weight Bearing Restrictions: Yes RLE Weight Bearing: Touchdown weight bearing(unable to maintain during sit to stand , and stand pivot transitions.  )    Mobility  Bed Mobility               General bed mobility comments: Pt seated on commode on arrival.   Transfers Overall transfer level: Needs assistance Equipment used: Rolling walker (2 wheeled);Sliding board Transfers: Sit to/from Stand;Lateral/Scoot Transfers Sit to Stand: Max assist;+2 safety/equipment(patient unable to maintain weight bearing during transition into standing.  )         Lateral/Scoot Transfers: Min assist;With slide board(but requires total assistance for placement of slding board pre transfers.  ) General transfer comment: Pt required max assistance to achieve standing at RW for perianal care.  During transition she is unable to maintain weight bearing or in stance phase.  Pt required assistance to turn and sit edge of bed for alternative transfer training.  Once in bed performed lateral scoot on slide board to [facility] and from [facility] to recliner chair.  Used chux pad to assist in scooting from surface to surface.    Ambulation/Gait                 Psychologist, counselling mobility: Yes Wheelchair propulsion: Both upper extremities;Left lower extremity Distance: short trials of 10 to 20 ft before requiring a rest period 80 ft total.   Wheelchair Assistance Details (indicate cue type and reason): Required min to mod assistance to propel WC.  Pt is slow and fatigues quickly.  Poor trunk strength causes her to scoot forward in chair when using LLE to aide in propulsion.  Pt  required cues turning, backing and forward propulsion.  Reviewed WC parts with patient and husband including: brakes, brake extenders, arm rests, leg rests, and how to fold  WC to fit in car.    Modified Rankin (Stroke Patients Only)       Balance Overall balance assessment: Needs assistance;History of Falls   Sitting balance-Leahy Scale: Fair  Standing balance-Leahy Scale: Poor Standing balance comment: assist for balance with UE support, mod A for gaining balance once standing,                             Cognition Arousal/Alertness: Awake/alert Behavior During Therapy: WFL for tasks assessed/performed Overall Cognitive Status: Within Functional Limits for tasks assessed                                        Exercises      General Comments        Pertinent Vitals/Pain Pain  Assessment: 0-10 Pain Score: 3  Pain Location: R foot between ankle and foot Pain Descriptors / Indicators: Sore Pain Intervention(s): Monitored during session;Repositioned;Ice applied    Home Living                      Prior Function            PT Goals (current goals can now be found in the care plan section) Acute Rehab PT Goals Patient Stated Goal: to go home Potential to Achieve Goals: Fair Additional Goals Additional Goal #1: patient to propel w/c in home environment with S x 50'. Progress towards PT goals: Progressing toward goals    Frequency    Min 3X/week      PT Plan Current plan remains appropriate    Co-evaluation              AM-PAC PT "6 Clicks" Daily Activity  Outcome Measure  Difficulty turning over in bed (including adjusting bedclothes, sheets and blankets)?: A Little Difficulty moving from lying on back to sitting on the side of the bed? : A Lot Difficulty sitting down on and standing up from a chair with arms (e.g., wheelchair, bedside commode, etc,.)?: Unable Help needed moving to and from a bed to chair (including a wheelchair)?: A Lot Help needed walking in hospital room?: Total Help needed climbing 3-5 steps with a railing? : Total 6 Click Score: 10    End of Session Equipment Utilized During Treatment: Gait belt Activity Tolerance: Patient tolerated treatment well Patient left: with call bell/phone within reach;with chair alarm set;in chair   PT Visit Diagnosis: Other abnormalities of gait and mobility (R26.89);History of falling (Z91.81)     Time: 1008-1040 PT Time Calculation (min) (ACUTE ONLY): 32 min  Charges:  $Therapeutic Activity: 8-22 mins $Wheel Chair Management: 8-22 mins                     Joycelyn Rua, PTA Acute Rehabilitation Services Pager [phone] Office [phone]     Myka Lukins Artis Delay 02/26/2018, 1:58 PM

## 2018-03-01 ENCOUNTER — Other Ambulatory Visit: Payer: Self-pay | Admitting: Licensed Clinical Social Worker

## 2018-03-01 ENCOUNTER — Other Ambulatory Visit: Payer: Self-pay | Admitting: Family Medicine

## 2018-03-01 DIAGNOSIS — E43 Unspecified severe protein-calorie malnutrition: Secondary | ICD-10-CM | POA: Diagnosis not present

## 2018-03-01 DIAGNOSIS — E1169 Type 2 diabetes mellitus with other specified complication: Secondary | ICD-10-CM | POA: Diagnosis not present

## 2018-03-01 DIAGNOSIS — Z4781 Encounter for orthopedic aftercare following surgical amputation: Secondary | ICD-10-CM | POA: Diagnosis not present

## 2018-03-01 DIAGNOSIS — I87331 Chronic venous hypertension (idiopathic) with ulcer and inflammation of right lower extremity: Secondary | ICD-10-CM | POA: Diagnosis not present

## 2018-03-01 DIAGNOSIS — Z6841 Body Mass Index (BMI) 40.0 and over, adult: Secondary | ICD-10-CM | POA: Diagnosis not present

## 2018-03-01 DIAGNOSIS — I2510 Atherosclerotic heart disease of native coronary artery without angina pectoris: Secondary | ICD-10-CM | POA: Diagnosis not present

## 2018-03-01 DIAGNOSIS — Z794 Long term (current) use of insulin: Secondary | ICD-10-CM | POA: Diagnosis not present

## 2018-03-01 DIAGNOSIS — E1142 Type 2 diabetes mellitus with diabetic polyneuropathy: Secondary | ICD-10-CM | POA: Diagnosis not present

## 2018-03-01 DIAGNOSIS — M86171 Other acute osteomyelitis, right ankle and foot: Secondary | ICD-10-CM | POA: Diagnosis not present

## 2018-03-01 DIAGNOSIS — Z955 Presence of coronary angioplasty implant and graft: Secondary | ICD-10-CM | POA: Diagnosis not present

## 2018-03-01 DIAGNOSIS — Z89421 Acquired absence of other right toe(s): Secondary | ICD-10-CM | POA: Diagnosis not present

## 2018-03-01 DIAGNOSIS — I5032 Chronic diastolic (congestive) heart failure: Secondary | ICD-10-CM | POA: Diagnosis not present

## 2018-03-01 DIAGNOSIS — Z89411 Acquired absence of right great toe: Secondary | ICD-10-CM | POA: Diagnosis not present

## 2018-03-01 DIAGNOSIS — Z7902 Long term (current) use of antithrombotics/antiplatelets: Secondary | ICD-10-CM | POA: Diagnosis not present

## 2018-03-01 DIAGNOSIS — Z7982 Long term (current) use of aspirin: Secondary | ICD-10-CM | POA: Diagnosis not present

## 2018-03-01 DIAGNOSIS — I110 Hypertensive heart disease with heart failure: Secondary | ICD-10-CM | POA: Diagnosis not present

## 2018-03-01 DIAGNOSIS — E039 Hypothyroidism, unspecified: Secondary | ICD-10-CM | POA: Diagnosis not present

## 2018-03-01 DIAGNOSIS — Z87891 Personal history of nicotine dependence: Secondary | ICD-10-CM | POA: Diagnosis not present

## 2018-03-01 NOTE — Patient Outreach (Signed)
Assessment:  CSW and RN Donald Siva received communication from [facility] on 03/01/18 regarding needs of Susan [name], [facility] client. Referral for client was sent by RN Alyson Ingles to [facility] on 03/01/18.  Medical Director of [employer] expressed concern that client discharge from hospital did not include antibiotics for client. CSW called client on 03/01/18. Susan [name] said she was told by RN at the hospital not to take antibiotics on discharge from hospital. Susan [name] said a prescription for a pain medication was called in for her (client) but said that RN at hospital told her upon discharge that client was not to take an antibiotic upon discharge.  CSW informed client that she may get a phone call from [facility] to discuss her current nursing and medical needs. Client agreed to receive call from [place]. CSW thanked client for phone call with CSW on 03/01/18. CSW then called Donald Siva RN on 82/5/05 and talked with Susan [name] about referral for client from Utilization management, RN Alyson Ingles.  Susan [name] reviewed referral from [place]. Susan Deep RN said she would call office of Dr. Sharol Given on 03/02/18 to discuss antibiotic situation/orders for client related to her recent hospital discharge.  Susan [name] said she wanted to talk with representative at office of Dr. Sharol Given for clarity before [facility] called client to discuss antibiotic issue.CSW invited Susan [name] to call CSW as needed related to client needs. CSW gave Susan [name] a contact phone number for client on 03/01/18.  Plan:  Donald Siva, RN to contact office of Dr. Meridee Score  on 03/02/18 to discuss antibiotic issue/questions with representative at practice of Dr. Sharol Given.   Susan [name].Susan [name] MSW, LCSW Licensed Clinical Social Worker [facility] Care Management [phone]

## 2018-03-02 ENCOUNTER — Telehealth (INDEPENDENT_AMBULATORY_CARE_PROVIDER_SITE_OTHER): Payer: Self-pay

## 2018-03-02 ENCOUNTER — Other Ambulatory Visit: Payer: Self-pay

## 2018-03-02 ENCOUNTER — Other Ambulatory Visit (INDEPENDENT_AMBULATORY_CARE_PROVIDER_SITE_OTHER): Payer: Self-pay

## 2018-03-02 MED ORDER — PROMETHAZINE HCL 12.5 MG PO TABS: 12.5000 mg | ORAL_TABLET | Freq: Four times a day (QID) | ORAL | 0 refills | Status: DC | PRN

## 2018-03-02 NOTE — Patient Outreach (Signed)
[name] Care Management  03/02/2018  Susan [name] 07/08/1950 [id number]   Urgent request received from [facility] Utilization Management due to patient not receiving antibiotics post-surgery. Requested that [facility] follow up with ortho provider regarding antibiotic treatment. Call placed to [name] office. Informed that Susan [name] received IV antibiotics preoperatively and for 24 hours post operatively. Reported that additional antibiotic therapy/oral antibiotics after discharge was not indicated.  Successful outreach to Susan [name]. She reported being safe at home and denied falls.  She reported medication compliance and engagement with [facility] and therapy services. Her primary concern was decreased independence and inability to drive due to recent ortho surgery. She reported that her spouse has returned to work but is available to assist and provide transportation. She denied urgent concerns and confirmed services with [facility]. Informed that RNCM would follow up this week prior to updating [facility].   [place] [phone]

## 2018-03-02 NOTE — Telephone Encounter (Signed)
Patient called stated she had surgery last Wednesday with Dr Sharol Given. She said she has been sick to her stomach ever since getting home from surgery. She said that she is scheduled for her first post op appt on Monday. Denies any fever or chills. She stated that when she was d/c from hospital she was told to stop antibiotic but her insurance company is calling her telling her that is not true that she should be on an antibiotic. Please call her to further advise. [phone]

## 2018-03-02 NOTE — Telephone Encounter (Signed)
Stacy physical therapists with [facility] would like to know if Dr. Sharol Given would be okay with the orders for patient to have PT 3 x week for 1 week, and 2 x a week for 3 weeks.  Cb# is [phone].  Please advise.  Thank you.

## 2018-03-02 NOTE — Telephone Encounter (Signed)
I called and sw pt and she is not taking any pain medication. She does report that she has not had a bowel movement since surgery. She is not vomiting but extremely nauseated. Per Shawn ok to fax in order for phenergan 12.5 mg 1 po q6 #10 to [facility] [address]. Advised pt that this will make her very sleepy and she should not drive and be prepared to be sleepy once she has taken medication. I also advised the pt that she can use an over the counter stool softener daily until she is regular. Sometimes the anesthesia can slow down the bowels. I advised that she did receive abx at the time of surgery IV and this was continued 24 hrs post op and that she would not need oral abx. Pt has an appt on Monday and will eval at that time. Felicia with [facility] also called directly after speaking with the pt and I advised that the protocol for our pts is to give abx at the time of surgery and then d/c and follow up in the office a week after surgery to monitor.

## 2018-03-03 ENCOUNTER — Telehealth (INDEPENDENT_AMBULATORY_CARE_PROVIDER_SITE_OTHER): Payer: Self-pay

## 2018-03-03 NOTE — Telephone Encounter (Signed)
Error-(Right lower ext.)

## 2018-03-03 NOTE — Telephone Encounter (Signed)
Please call.

## 2018-03-03 NOTE — Telephone Encounter (Signed)
Called [facility] and spoke to [place] and she stated that our patient has been putting a lot of weight on her leg and we addressed her current orders by Dr Sharol Given and she verbally understood them and concluded that she tried to inform the patient that she needs to keep off of her lower left extremity as much as possible.

## 2018-03-04 ENCOUNTER — Telehealth (INDEPENDENT_AMBULATORY_CARE_PROVIDER_SITE_OTHER): Payer: Self-pay

## 2018-03-04 ENCOUNTER — Other Ambulatory Visit: Payer: Self-pay

## 2018-03-04 NOTE — Patient Outreach (Signed)
[facility]) Care Management  03/04/2018  [name] 08-20-1950 [id number]    RNCM placed follow up call to ensure [name] had no further needs. No answer to listed number. Left HIPAA compliant voice message requesting a return call.   [place] [phone]

## 2018-03-04 NOTE — Telephone Encounter (Signed)
I called and the p't HHn was still present. She has a red, swollen, painful hand with warmth to the though. There was no injury and no IV in this hand. She is not running a temp and per [place] PA I advised for the pt to proceed to the ER. Without injury and given the pts health history it would be best for her to go and be evaluated and I will hold this message to follow up with her in the morning.

## 2018-03-04 NOTE — Telephone Encounter (Signed)
Annette with [facility] called stating that patient has left hand pain, swelling, and warmness.  Stated that this started yesterday afternoon.  Patient can't grip or hold anything.  Had surgery on 02/24/2018 for Right GRT Toe and 2nd Toe.  CB# is [phone].  Please advise.  Thank You.

## 2018-03-05 ENCOUNTER — Inpatient Hospital Stay (INDEPENDENT_AMBULATORY_CARE_PROVIDER_SITE_OTHER): Payer: PPO | Admitting: Physician Assistant

## 2018-03-05 ENCOUNTER — Inpatient Hospital Stay (HOSPITAL_COMMUNITY)
Admission: EM | Admit: 2018-03-05 | Discharge: 2018-03-11 | DRG: 603 | Disposition: A | Discharge status: Home Health | Payer: PPO | Attending: Internal Medicine | Admitting: Internal Medicine

## 2018-03-05 ENCOUNTER — Encounter (HOSPITAL_COMMUNITY): Payer: Self-pay

## 2018-03-05 ENCOUNTER — Emergency Department (HOSPITAL_COMMUNITY): Payer: PPO

## 2018-03-05 ENCOUNTER — Other Ambulatory Visit: Payer: Self-pay

## 2018-03-05 DIAGNOSIS — R1310 Dysphagia, unspecified: Secondary | ICD-10-CM | POA: Diagnosis not present

## 2018-03-05 DIAGNOSIS — Z89421 Acquired absence of other right toe(s): Secondary | ICD-10-CM

## 2018-03-05 DIAGNOSIS — R0600 Dyspnea, unspecified: Secondary | ICD-10-CM | POA: Diagnosis not present

## 2018-03-05 DIAGNOSIS — R4182 Altered mental status, unspecified: Secondary | ICD-10-CM | POA: Diagnosis not present

## 2018-03-05 DIAGNOSIS — I959 Hypotension, unspecified: Secondary | ICD-10-CM | POA: Diagnosis not present

## 2018-03-05 DIAGNOSIS — F329 Major depressive disorder, single episode, unspecified: Secondary | ICD-10-CM | POA: Diagnosis present

## 2018-03-05 DIAGNOSIS — Z6841 Body Mass Index (BMI) 40.0 and over, adult: Secondary | ICD-10-CM

## 2018-03-05 DIAGNOSIS — K5900 Constipation, unspecified: Secondary | ICD-10-CM | POA: Diagnosis not present

## 2018-03-05 DIAGNOSIS — Z9641 Presence of insulin pump (external) (internal): Secondary | ICD-10-CM | POA: Diagnosis present

## 2018-03-05 DIAGNOSIS — E1151 Type 2 diabetes mellitus with diabetic peripheral angiopathy without gangrene: Secondary | ICD-10-CM | POA: Diagnosis present

## 2018-03-05 DIAGNOSIS — D649 Anemia, unspecified: Secondary | ICD-10-CM | POA: Diagnosis present

## 2018-03-05 DIAGNOSIS — I2721 Secondary pulmonary arterial hypertension: Secondary | ICD-10-CM | POA: Diagnosis not present

## 2018-03-05 DIAGNOSIS — E1122 Type 2 diabetes mellitus with diabetic chronic kidney disease: Secondary | ICD-10-CM | POA: Diagnosis present

## 2018-03-05 DIAGNOSIS — R0902 Hypoxemia: Secondary | ICD-10-CM

## 2018-03-05 DIAGNOSIS — M109 Gout, unspecified: Secondary | ICD-10-CM | POA: Diagnosis present

## 2018-03-05 DIAGNOSIS — Z955 Presence of coronary angioplasty implant and graft: Secondary | ICD-10-CM

## 2018-03-05 DIAGNOSIS — I5032 Chronic diastolic (congestive) heart failure: Secondary | ICD-10-CM | POA: Diagnosis not present

## 2018-03-05 DIAGNOSIS — I130 Hypertensive heart and chronic kidney disease with heart failure and stage 1 through stage 4 chronic kidney disease, or unspecified chronic kidney disease: Secondary | ICD-10-CM | POA: Diagnosis not present

## 2018-03-05 DIAGNOSIS — Z8249 Family history of ischemic heart disease and other diseases of the circulatory system: Secondary | ICD-10-CM

## 2018-03-05 DIAGNOSIS — E1165 Type 2 diabetes mellitus with hyperglycemia: Secondary | ICD-10-CM

## 2018-03-05 DIAGNOSIS — I252 Old myocardial infarction: Secondary | ICD-10-CM

## 2018-03-05 DIAGNOSIS — I739 Peripheral vascular disease, unspecified: Secondary | ICD-10-CM | POA: Diagnosis present

## 2018-03-05 DIAGNOSIS — I5023 Acute on chronic systolic (congestive) heart failure: Secondary | ICD-10-CM | POA: Diagnosis present

## 2018-03-05 DIAGNOSIS — M79642 Pain in left hand: Secondary | ICD-10-CM | POA: Diagnosis not present

## 2018-03-05 DIAGNOSIS — N184 Chronic kidney disease, stage 4 (severe): Secondary | ICD-10-CM | POA: Diagnosis present

## 2018-03-05 DIAGNOSIS — Z89411 Acquired absence of right great toe: Secondary | ICD-10-CM

## 2018-03-05 DIAGNOSIS — Z9181 History of falling: Secondary | ICD-10-CM

## 2018-03-05 DIAGNOSIS — E43 Unspecified severe protein-calorie malnutrition: Secondary | ICD-10-CM | POA: Diagnosis not present

## 2018-03-05 DIAGNOSIS — E039 Hypothyroidism, unspecified: Secondary | ICD-10-CM | POA: Diagnosis not present

## 2018-03-05 DIAGNOSIS — I872 Venous insufficiency (chronic) (peripheral): Secondary | ICD-10-CM | POA: Diagnosis present

## 2018-03-05 DIAGNOSIS — M6530 Trigger finger, unspecified finger: Secondary | ICD-10-CM | POA: Diagnosis not present

## 2018-03-05 DIAGNOSIS — M1A0420 Idiopathic chronic gout, left hand, without tophus (tophi): Secondary | ICD-10-CM | POA: Diagnosis not present

## 2018-03-05 DIAGNOSIS — R0603 Acute respiratory distress: Secondary | ICD-10-CM | POA: Diagnosis not present

## 2018-03-05 DIAGNOSIS — Z7902 Long term (current) use of antithrombotics/antiplatelets: Secondary | ICD-10-CM

## 2018-03-05 DIAGNOSIS — M65332 Trigger finger, left middle finger: Secondary | ICD-10-CM | POA: Diagnosis not present

## 2018-03-05 DIAGNOSIS — I5022 Chronic systolic (congestive) heart failure: Secondary | ICD-10-CM | POA: Diagnosis present

## 2018-03-05 DIAGNOSIS — Z87891 Personal history of nicotine dependence: Secondary | ICD-10-CM

## 2018-03-05 DIAGNOSIS — L03114 Cellulitis of left upper limb: Secondary | ICD-10-CM | POA: Diagnosis not present

## 2018-03-05 DIAGNOSIS — Z79890 Hormone replacement therapy (postmenopausal): Secondary | ICD-10-CM

## 2018-03-05 DIAGNOSIS — Z7982 Long term (current) use of aspirin: Secondary | ICD-10-CM

## 2018-03-05 DIAGNOSIS — I25119 Atherosclerotic heart disease of native coronary artery with unspecified angina pectoris: Secondary | ICD-10-CM | POA: Diagnosis not present

## 2018-03-05 DIAGNOSIS — E790 Hyperuricemia without signs of inflammatory arthritis and tophaceous disease: Secondary | ICD-10-CM | POA: Diagnosis not present

## 2018-03-05 DIAGNOSIS — L0390 Cellulitis, unspecified: Secondary | ICD-10-CM | POA: Diagnosis present

## 2018-03-05 LAB — CBC WITH DIFFERENTIAL/PLATELET
Abs Immature Granulocytes: 0.02 10*3/uL (ref 0.00–0.07)
Basophils Absolute: 0 10*3/uL (ref 0.0–0.1)
Basophils Relative: 0 %
Eosinophils Absolute: 0.2 10*3/uL (ref 0.0–0.5)
Eosinophils Relative: 4 %
HCT: 34.2 % — ABNORMAL LOW (ref 36.0–46.0)
Hemoglobin: 10.1 g/dL — ABNORMAL LOW (ref 12.0–15.0)
Immature Granulocytes: 0 %
Lymphocytes Relative: 16 %
Lymphs Abs: 0.9 10*3/uL (ref 0.7–4.0)
MCH: 23.4 pg — ABNORMAL LOW (ref 26.0–34.0)
MCHC: 29.5 g/dL — ABNORMAL LOW (ref 30.0–36.0)
MCV: 79.4 fL — ABNORMAL LOW (ref 80.0–100.0)
Monocytes Absolute: 0.4 10*3/uL (ref 0.1–1.0)
Monocytes Relative: 8 %
Neutro Abs: 4 10*3/uL (ref 1.7–7.7)
Neutrophils Relative %: 72 %
Platelets: 326 10*3/uL (ref 150–400)
RBC: 4.31 MIL/uL (ref 3.87–5.11)
RDW: 15.9 % — ABNORMAL HIGH (ref 11.5–15.5)
WBC: 5.6 10*3/uL (ref 4.0–10.5)
nRBC: 0 % (ref 0.0–0.2)

## 2018-03-05 LAB — BASIC METABOLIC PANEL
Anion gap: 8 (ref 5–15)
BUN: 45 mg/dL — ABNORMAL HIGH (ref 8–23)
CO2: 26 mmol/L (ref 22–32)
Calcium: 8.8 mg/dL — ABNORMAL LOW (ref 8.9–10.3)
Chloride: 104 mmol/L (ref 98–111)
Creatinine, Ser: 1.93 mg/dL — ABNORMAL HIGH (ref 0.44–1.00)
GFR calc Af Amer: 30 mL/min — ABNORMAL LOW (ref >60)
GFR calc non Af Amer: 26 mL/min — ABNORMAL LOW (ref >60)
Glucose, Bld: 154 mg/dL — ABNORMAL HIGH (ref 70–99)
Potassium: 3.7 mmol/L (ref 3.5–5.1)
Sodium: 138 mmol/L (ref 135–145)

## 2018-03-05 LAB — I-STAT CG4 LACTIC ACID, ED: Lactic Acid, Venous: 0.77 mmol/L (ref 0.5–1.9)

## 2018-03-05 LAB — C-REACTIVE PROTEIN: CRP: 8.4 mg/dL — ABNORMAL HIGH (ref <1.0)

## 2018-03-05 MED ORDER — SODIUM CHLORIDE 0.9 % IV SOLN
1.0000 g | Freq: Once | INTRAVENOUS | Status: AC
  Administered 2018-03-05: 1 g via INTRAVENOUS
  Filled 2018-03-05: qty 10

## 2018-03-05 MED ORDER — VANCOMYCIN HCL 10 G IV SOLR
2000.0000 mg | Freq: Once | INTRAVENOUS | Status: AC
  Administered 2018-03-05: 2000 mg via INTRAVENOUS
  Filled 2018-03-05: qty 2000

## 2018-03-05 NOTE — ED Provider Notes (Signed)
[facility] EMERGENCY DEPARTMENT Provider Note   CSN: [id number] Arrival date & time: 03/05/18  1826    History   Chief Complaint Chief Complaint  Patient presents with  . Post-op Problem    HPI Susan [name] is a 67 y.o. female.   67 y/o female with PMH positive for CHF, CAD, DM, hypothyroidism, CKD and anemia presents to the ED for pain and swelling in the L hand and 3rd digit. Notes worsening symptoms x 2 days. Has pain with ROM of the hand and left 3rd digit, making it difficulty to grip things. Denies numbness or paresthesias, fevers, trauma or injury to the area. Was discharged from the hospital 1 week ago after admission for osteomyelitis of the R toes; is unsure of whether or not she had an IV in this hand. Was seen by her [facility] nurse who advised she come to the ED for evaluation. Has f/u with Dr. Sharol Given of [facility] on Monday. Is not currently on any antibiotics.     Past Medical History:  Diagnosis Date  . Acute MI ([place]) 1999; 2007  . Anemia    hx  . Anginal pain ([place])   . Anxiety   . ARF (acute renal failure) ([place]) 06/2017   [facility]  . Arthritis    "generalized" (03/15/2014)  . CAD (coronary artery disease)    MI in 2000 - MI  2007 - treated bare metal stent (no nuclear since then as 9/11)  . Carotid artery disease ([place])   . CHF (congestive heart failure) ([place])   . Chronic diastolic heart failure (HCC)    a) ECHO (08/2013) EF 55-60% and RV function nl b) RHC (08/2013) RA 4, RV 30/5/7, PA 25/10 (16), PCWP 7, Fick CO/CI 6.3/2.7, PVR 1.5 WU, PA 61 and 66%  . Daily headache    "~ every other day; since I fell in June" (03/15/2014)  . Depression   . Dyslipidemia   . Dyspnea   . Exertional shortness of breath   . HTN (hypertension)   . Hypothyroidism   . Neuropathy   . Obesity   . Osteoarthritis   . Peripheral neuropathy   . PONV (postoperative nausea and vomiting)   . RBBB (right bundle branch block)    Old  . Stroke  [facility])    mini strokes  . Syncope    likely due to low blood sugar  . Tachycardia    Sinus tachycardia  . Type II diabetes mellitus (HCC)    Type II  . Urinary incontinence   . Venous insufficiency     Patient Active Problem List   Diagnosis Date Noted  . Amputated toe of right foot ([place]) 02/24/2018  . Osteomyelitis of second toe of right foot ([place])   . Osteomyelitis of great toe of right foot ([place])   . 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 ([place])   . Acute on chronic diastolic (congestive) heart failure ([place]) 02/13/2018  . PVD (peripheral vascular disease) ([place]) 10/26/2017  . Hypothyroid 07/27/2017  . PAH (pulmonary artery hypertension) ([place])   . Impaired ambulation 07/19/2017  . Hyperosmolar non-ketotic state in patient with type 2 diabetes mellitus ([place]) 07/15/2017  . Leg cramps 02/27/2017  . Peripheral edema 01/12/2017  . Diabetic neuropathy ([place]) 11/12/2016  . 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 mellitus ([place]) 10/24/2015  . Anemia 10/03/2015  . Generalized anxiety disorder 10/03/2015  . Insomnia 10/03/2015  . Diabetes mellitus type II, uncontrolled (  [place]) 06/07/2015  . Chronic diastolic CHF (congestive heart failure) ([place]) 06/07/2015  . Non compliance with medical treatment 04/17/2014  . Rotator cuff tear 03/14/2014  . Morbid obesity ([place]) 09/23/2013  . Urinary incontinence   . MDD (major depressive disorder) 11/12/2010  . RBBB (right bundle branch block)   . CAD (coronary artery disease)   . Hyperlipemia 01/22/2009  . Essential hypertension 01/22/2009    Past Surgical History:  Procedure Laterality Date  . ABDOMINAL HYSTERECTOMY  1980's  . AMPUTATION Right 02/24/2018   Procedure: RIGHT FOOT GREAT TOE AND 2ND TOE AMPUTATION;  Surgeon: Newt Minion, MD;  Location: [facility];  Service: Orthopedics;  Laterality: Right;  . CATARACT EXTRACTION, BILATERAL Bilateral ?2013  . COLONOSCOPY W/ POLYPECTOMY    . CORONARY ANGIOPLASTY WITH STENT PLACEMENT  1999;  2007   "1 + 1"  . EYE SURGERY Bilateral    [facility]  . KNEE ARTHROSCOPY Left 10/25/2006  . RIGHT HEART CATH N/A 07/24/2017   Procedure: RIGHT HEART CATH;  Surgeon: Jolaine Artist, MD;  Location: [facility] CV LAB;  Service: Cardiovascular;  Laterality: N/A;  . RIGHT HEART CATHETERIZATION N/A 09/22/2013   Procedure: RIGHT HEART CATH;  Surgeon: Jolaine Artist, MD;  Location: [facility] CATH LAB;  Service: Cardiovascular;  Laterality: N/A;  . SHOULDER ARTHROSCOPY WITH OPEN ROTATOR CUFF REPAIR Right 03/14/2014   Procedure: RIGHT SHOULDER ARTHROSCOPY WITH BICEPS RELEASE, OPEN SUBSCAPULA REPAIR, OPEN SUPRASPINATUS REPAIR.;  Surgeon: Meredith Pel, MD;  Location: [place];  Service: Orthopedics;  Laterality: Right;  . TOE AMPUTATION Right 02/24/2018   GREAT TOE AND 2ND TOE AMPUTATION  . TUBAL LIGATION  1970's     OB History   None      Home Medications    Prior to Admission medications   Medication Sig Start Date End Date Taking? Authorizing Provider  albuterol (PROVENTIL HFA;VENTOLIN HFA) 108 (90 BASE) MCG/ACT inhaler Inhale 2 puffs into the lungs every 6 (six) hours as needed for wheezing or shortness of breath. Reported on 10/30/2015   Yes [provider]  aspirin EC 81 MG tablet Take 81 mg by mouth daily.   Yes [provider]  carvedilol (COREG) 12.5 MG tablet TAKE 1 TABLET BY MOUTH TWICE A DAY WITH A MEAL 03/01/18  Yes [place], [name], MD  cholecalciferol (VITAMIN D) 1000 units tablet Take 1,000 Units by mouth daily.   Yes [provider]  clopidogrel (PLAVIX) 75 MG tablet TAKE 1 TABLET BY MOUTH DAILY WITH BREAKFAST. Patient taking differently: Take 75 mg by mouth daily.  11/23/17  Yes Larey Dresser, MD  escitalopram (LEXAPRO) 20 MG tablet Take 1 tablet (20 mg total) by mouth daily. 10/26/17  Yes [place], [name], MD  ferrous sulfate 325 (65 FE) MG tablet Take 325 mg by mouth daily with breakfast. Reported on 08/21/2015   Yes [provider]    HYDROcodone-acetaminophen (NORCO/VICODIN) 5-325 MG tablet Take 1 tablet by mouth every 4 (four) hours as needed for moderate pain. 02/25/18  Yes Newt Minion, MD  Insulin Human (INSULIN PUMP) SOLN Inject into the skin.   Yes [provider]  insulin regular (NOVOLIN R,HUMULIN R) 100 units/mL injection 3 (three) times daily before meals.   Yes [provider]  isosorbide dinitrate (ISORDIL) 10 MG tablet TAKE 1 TABLET BY MOUTH TWICE A DAY Patient taking differently: Take 10 mg by mouth 2 (two) times daily.  08/17/17  Yes Bensimhon, Shaune Pascal, MD  levothyroxine (SYNTHROID, LEVOTHROID) 50  MCG tablet Take 1 tablet (50 mcg total) by mouth daily before breakfast. 11/07/16  Yes Dena Billet B, PA-C  losartan (COZAAR) 50 MG tablet Take 1 tablet (50 mg total) by mouth daily. 02/12/18  Yes [place], [name], MD  metolazone (ZAROXOLYN) 5 MG tablet Take 1 tablet (5 mg total) by mouth once a week. 09/25/17  Yes [place], [name], MD  [facility] VERIO test strip  11/02/17  Yes [provider]  pregabalin (LYRICA) 150 MG capsule Take 1 capsule (150 mg total) by mouth 2 (two) times daily. 11/12/16  Yes [place], [name], MD  promethazine (PHENERGAN) 12.5 MG tablet Take 1 tablet (12.5 mg total) by mouth every 6 (six) hours as needed for nausea or vomiting. 03/02/18  Yes [place], Neta Mends, PA-C  rosuvastatin (CRESTOR) 20 MG tablet TAKE 1 TABLET BY MOUTH EVERY DAY Patient taking differently: Take 20 mg by mouth daily.  12/30/17  Yes [place], [name], MD  torsemide (DEMADEX) 20 MG tablet Take 80 mg by mouth 2 (two) times daily.   Yes [provider]  vitamin E (VITAMIN E) 1000 UNIT capsule Take 1,000 Units by mouth daily.   Yes [provider]  Magnesium Oxide 400 (240 Mg) MG TABS TAKE 1 TABLET BY MOUTH EVERY DAY Patient taking differently: TAKE 240 mg TABLET BY MOUTH EVERY DAY 06/23/17   [name], MD  metolazone (ZAROXOLYN) 2.5 MG tablet TAKE 1 TABLET BY MOUTH ONCE A  WEEK. Patient not taking: Reported on 03/05/2018 02/22/18   Bensimhon, Shaune Pascal, MD  nitroGLYCERIN (NITROSTAT) 0.4 MG SL tablet PLACE 1 TABLET (0.4 MG TOTAL) UNDER THE TONGUE EVERY 5 (FIVE) MINUTES AS NEEDED FOR CHEST PAIN. 08/28/16   Dena Billet B, PA-C  ondansetron (ZOFRAN) 4 MG tablet Take 1 tablet (4 mg total) by mouth every 8 (eight) hours as needed for nausea or vomiting. 01/13/18   Alycia Rossetti, MD    Family History Family History  Problem Relation Age of Onset  . Heart attack Mother 57    Social History Social History   Tobacco Use  . Smoking status: Former Smoker    Packs/day: 3.00    Years: 32.00    Pack years: 96.00    Types: Cigarettes    Last attempt to quit: 10/24/1997    Years since quitting: 20.3  . Smokeless tobacco: Never Used  Substance Use Topics  . Alcohol use: Not Currently    Comment: "might have 2-3 daiquiris in the summer"  . Drug use: No     Allergies   Codeine   Review of Systems Review of Systems Ten systems reviewed and are negative for acute change, except as noted in the HPI.    Physical Exam Updated Vital Signs BP (!) 173/84   Pulse 79   Temp 97.9 F (36.6 C) (Oral)   Resp 16   Ht 5\' 5"  (1.651 m)   Wt 124.7 kg   SpO2 97%   BMI 45.75 kg/m   Physical Exam  Constitutional: She is oriented to person, place, and time. She appears well-developed and well-nourished. No distress.  Obese female, in NAD. Nontoxic.   HENT:  Head: Normocephalic and atraumatic.  Eyes: Conjunctivae and EOM are normal. No scleral icterus.  Neck: Normal range of motion.  Cardiovascular: Normal rate, regular rhythm and intact distal pulses.  Distal radial pulse is 2+ in the LUE. Capillary refill brisk in all digits of the L hand.  Pulmonary/Chest: Effort normal. No stridor. No respiratory  distress.  Respirations even and unlabored  Musculoskeletal: She exhibits edema and tenderness.  Erythema and heat to touch of the left middle finger extending to the  dorsum of the L hand. There is swelling of the left middle finger with TTP along the flexor aspect of the digit. Pain with passive ROM. No appreciable abscess or palpable deformity. No lymphangitic streaking up the L arm.  Neurological: She is alert and oriented to person, place, and time. She exhibits normal muscle tone. Coordination normal.  Skin: Skin is warm and dry. No rash noted. She is not diaphoretic. There is erythema. No pallor.  Psychiatric: She has a normal mood and affect. Her behavior is normal.  Nursing note and vitals reviewed.           ED Treatments / Results  Labs (all labs ordered are listed, but only abnormal results are displayed) Labs Reviewed  CBC WITH DIFFERENTIAL/PLATELET - Abnormal; Notable for the following components:      Result Value   Hemoglobin 10.1 (*)    HCT 34.2 (*)    MCV 79.4 (*)    MCH 23.4 (*)    MCHC 29.5 (*)    RDW 15.9 (*)    All other components within normal limits  BASIC METABOLIC PANEL - Abnormal; Notable for the following components:   Glucose, Bld 154 (*)    BUN 45 (*)    Creatinine, Ser 1.93 (*)    Calcium 8.8 (*)    GFR calc non Af Amer 26 (*)    GFR calc Af Amer 30 (*)    All other components within normal limits  C-REACTIVE PROTEIN - Abnormal; Notable for the following components:   CRP 8.4 (*)    All other components within normal limits  I-STAT CG4 LACTIC ACID, ED    EKG None  Radiology Dg Hand Complete Left  Result Date: 03/05/2018 CLINICAL DATA:  Initial evaluation for acute left hand pain, erythema, edema. EXAM: LEFT HAND - COMPLETE 3+ VIEW COMPARISON:  None. FINDINGS: No acute fracture dislocation. Mild osteoarthritic changes present about the hand, most notable at the first IP joint. Osseous mineralization normal. Mild diffuse soft tissue swelling about the hand, most notable at the ulnar aspect. No abnormal soft tissue emphysema. No radiopaque foreign body. No osseous erosive changes. IMPRESSION: 1. Mild  diffuse soft tissue swelling about the hand. 2. No other acute osseous abnormality. Electronically Signed   By: Jeannine Boga M.D.   On: 03/05/2018 19:35    Procedures Procedures (including critical care time)  Medications Ordered in ED Medications  vancomycin (VANCOCIN) 2,000 mg in sodium chloride 0.9 % 500 mL IVPB (2,000 mg Intravenous New Bag/Given 03/05/18 2245)  HYDROcodone-acetaminophen (NORCO/VICODIN) 5-325 MG per tablet 2 tablet (has no administration in time range)  ondansetron (ZOFRAN-ODT) disintegrating tablet 4 mg (has no administration in time range)  cefTRIAXone (ROCEPHIN) 1 g in sodium chloride 0.9 % 100 mL IVPB (0 g Intravenous Stopped 03/06/18 0000)     11:55 PM Case discussed with Dr. Marlou Sa of [facility] regarding the patient's case, given that she has been followed by Dr. Sharol Given for recent osteomyelitis s/p amputation. Dr. Marlou Sa is on call for general orthopedics and advises reaching out to orthopedic hand surgery for recommendations on continued care.   12:20 AM Discussed patient case with Dr. Grandville Silos of Orthopedics. He has reviewed the patient's media images as well as history. Dr. Grandville Silos does not believe that patient requires operative management at this time. He does feel,  given her recent history and comorbidities, that she would benefit from more aggressive treatment with inpatient IV antibiotics. Will discuss plan with patient in anticipation of medical admission.  12:58 AM Case discussed with Dr. Hal Hope who will admit the patient.  Discussed, specifically, Dr. Biagio Borg recommendations for continued IV antibiotics WITHOUT need for operative management from hand surgical service at this time.   CRITICAL CARE Performed by: Antonietta Breach   Total critical care time: 35 minutes  Critical care time was exclusive of separately billable procedures and treating other patients.  Critical care was necessary to treat or prevent imminent or  life-threatening deterioration.  Critical care was time spent personally by me on the following activities: development of treatment plan with patient and/or surrogate as well as nursing, discussions with consultants, evaluation of patient's response to treatment, examination of patient, obtaining history from patient or surrogate, ordering and performing treatments and interventions, ordering and review of laboratory studies, ordering and review of radiographic studies, pulse oximetry and re-evaluation of patient's condition.    Initial Impression / Assessment and Plan / ED Course  I have reviewed the triage vital signs and the nursing notes.  Pertinent labs & imaging results that were available during my care of the patient were reviewed by me and considered in my medical decision making (see chart for details).     Patient presenting for erythema, swelling, pain to left hand extending to include to the left third digit.  She is neurovascularly intact and nontoxic on my assessment.  Laboratory work-up notable for elevated CRP of 8.4.  Labs are, otherwise, reassuring without leukocytosis or elevated lactate level.  The patient is afebrile.  Given recent history and clinical comorbidities, will admit for observation and continued IV antibiotics.  She has been given Rocephin and vancomycin in the ED today.  Case discussed with Dr. Hal Hope who will admit.   Final Clinical Impressions(s) / ED Diagnoses   Final diagnoses:  Cellulitis of hand, left    ED Discharge Orders    None       Antonietta Breach, PA-C 03/06/18 0101    Duffy Bruce, MD 03/06/18 [phone]

## 2018-03-05 NOTE — ED Provider Notes (Signed)
Patient placed in Quick Look pathway, seen and evaluated   Chief Complaint: Hand pain and swelling  HPI:   Susan [name] is a 67 y.o. who presents to the emergency department for progressively worsening left hand swelling and redness.  Patient states that her home health nurse saw her yesterday and encouraged her to come to the ER, however she did not want to do so.  When her home health nurse came today, she told her that she really needed to go to the ER to get evaluated.  She denies having IV or any blood drawn from this area of her hand.  She does endorse difficulty gripping things and is dropped repetitively throughout the day yesterday and today.  ROS: + color change, hand pain; - fever  Physical Exam:   Gen: No distress  Neuro: Awake and Alert  Skin: Warm    Focused Exam: Swelling to dorsum of the right hand with overlying erythema. Decreased grip strength.    Initiation of care has begun. The patient has been counseled on the process, plan, and necessity for staying for the completion/evaluation, and the remainder of the medical screening examination    Dicie Edelen, Ozella Almond, PA-C 03/05/18 1904    Blanchie Dessert, MD 03/05/18 [phone]

## 2018-03-05 NOTE — Telephone Encounter (Signed)
I called pt and she did not go to the ER for eval. Pt states that she has continued to have pain and swelling and that her husband would bring her to the ER this evening after he returns home for work. I stressed the importance of going for evaluation. The pt has a red, swollen, painful to touch warm hand without injury. HHN is concerned and I advised the pt that she needs to go for eval. I will call and follow up on Monday.

## 2018-03-05 NOTE — ED Notes (Signed)
Isaacs MD at bedside °

## 2018-03-05 NOTE — ED Triage Notes (Signed)
Pt here for swelling to the left hand after having surgical procedure done on the left foot.  Denies having IV or blood drawn from her hand.  A&Ox4, states hand is sore.

## 2018-03-06 ENCOUNTER — Encounter (HOSPITAL_COMMUNITY): Payer: Self-pay

## 2018-03-06 ENCOUNTER — Other Ambulatory Visit: Payer: Self-pay

## 2018-03-06 DIAGNOSIS — L0390 Cellulitis, unspecified: Secondary | ICD-10-CM | POA: Diagnosis present

## 2018-03-06 DIAGNOSIS — L03114 Cellulitis of left upper limb: Secondary | ICD-10-CM

## 2018-03-06 LAB — BASIC METABOLIC PANEL
Anion gap: 9 (ref 5–15)
BUN: 40 mg/dL — ABNORMAL HIGH (ref 8–23)
CO2: 24 mmol/L (ref 22–32)
Calcium: 8.5 mg/dL — ABNORMAL LOW (ref 8.9–10.3)
Chloride: 107 mmol/L (ref 98–111)
Creatinine, Ser: 1.6 mg/dL — ABNORMAL HIGH (ref 0.44–1.00)
GFR calc Af Amer: 37 mL/min — ABNORMAL LOW (ref >60)
GFR calc non Af Amer: 32 mL/min — ABNORMAL LOW (ref >60)
Glucose, Bld: 132 mg/dL — ABNORMAL HIGH (ref 70–99)
Potassium: 3.5 mmol/L (ref 3.5–5.1)
Sodium: 140 mmol/L (ref 135–145)

## 2018-03-06 LAB — CBC
HCT: 32.2 % — ABNORMAL LOW (ref 36.0–46.0)
Hemoglobin: 9.5 g/dL — ABNORMAL LOW (ref 12.0–15.0)
MCH: 23 pg — ABNORMAL LOW (ref 26.0–34.0)
MCHC: 29.5 g/dL — ABNORMAL LOW (ref 30.0–36.0)
MCV: 78 fL — ABNORMAL LOW (ref 80.0–100.0)
Platelets: 302 10*3/uL (ref 150–400)
RBC: 4.13 MIL/uL (ref 3.87–5.11)
RDW: 15.9 % — ABNORMAL HIGH (ref 11.5–15.5)
WBC: 4.1 10*3/uL (ref 4.0–10.5)
nRBC: 0 % (ref 0.0–0.2)

## 2018-03-06 LAB — GLUCOSE, CAPILLARY
Glucose-Capillary: 117 mg/dL — ABNORMAL HIGH (ref 70–99)
Glucose-Capillary: 124 mg/dL — ABNORMAL HIGH (ref 70–99)
Glucose-Capillary: 98 mg/dL (ref 70–99)
Glucose-Capillary: 138 mg/dL — ABNORMAL HIGH (ref 70–99)

## 2018-03-06 LAB — URIC ACID: Uric Acid, Serum: 9 mg/dL — ABNORMAL HIGH (ref 2.5–7.1)

## 2018-03-06 MED ORDER — ISOSORBIDE DINITRATE 10 MG PO TABS
10.0000 mg | ORAL_TABLET | Freq: Two times a day (BID) | ORAL | Status: DC
  Administered 2018-03-06 – 2018-03-11 (×10): 10 mg via ORAL
  Filled 2018-03-06 (×13): qty 1

## 2018-03-06 MED ORDER — VANCOMYCIN HCL 10 G IV SOLR
1250.0000 mg | INTRAVENOUS | Status: DC
  Administered 2018-03-06 – 2018-03-08 (×3): 1250 mg via INTRAVENOUS
  Filled 2018-03-06 (×4): qty 1250

## 2018-03-06 MED ORDER — LEVOTHYROXINE SODIUM 50 MCG PO TABS
50.0000 ug | ORAL_TABLET | Freq: Every day | ORAL | Status: DC
  Administered 2018-03-06 – 2018-03-11 (×6): 50 ug via ORAL
  Filled 2018-03-06: qty 2
  Filled 2018-03-06 (×5): qty 1

## 2018-03-06 MED ORDER — INSULIN PUMP
SUBCUTANEOUS | Status: DC
  Administered 2018-03-06 – 2018-03-08 (×7): via SUBCUTANEOUS
  Administered 2018-03-08: 15 via SUBCUTANEOUS
  Administered 2018-03-08 – 2018-03-09 (×2): via SUBCUTANEOUS
  Administered 2018-03-09: 12 via SUBCUTANEOUS
  Administered 2018-03-09: 15 via SUBCUTANEOUS
  Administered 2018-03-09: 12 via SUBCUTANEOUS
  Administered 2018-03-09: 21:00:00 via SUBCUTANEOUS
  Administered 2018-03-10: 15 via SUBCUTANEOUS
  Administered 2018-03-10: via SUBCUTANEOUS
  Administered 2018-03-10: 15 via SUBCUTANEOUS
  Administered 2018-03-10: 10 via SUBCUTANEOUS
  Administered 2018-03-10: 04:00:00 via SUBCUTANEOUS
  Administered 2018-03-10: 5 via SUBCUTANEOUS
  Administered 2018-03-11: 12 via SUBCUTANEOUS
  Administered 2018-03-11: 7 via SUBCUTANEOUS
  Administered 2018-03-11: 12 via SUBCUTANEOUS
  Filled 2018-03-06: qty 1

## 2018-03-06 MED ORDER — CARVEDILOL 12.5 MG PO TABS
12.5000 mg | ORAL_TABLET | Freq: Two times a day (BID) | ORAL | Status: DC
  Administered 2018-03-06 – 2018-03-11 (×12): 12.5 mg via ORAL
  Filled 2018-03-06 (×12): qty 1

## 2018-03-06 MED ORDER — FERROUS SULFATE 325 (65 FE) MG PO TABS
325.0000 mg | ORAL_TABLET | Freq: Every day | ORAL | Status: DC
  Administered 2018-03-06 – 2018-03-11 (×6): 325 mg via ORAL
  Filled 2018-03-06 (×6): qty 1

## 2018-03-06 MED ORDER — ONDANSETRON HCL 4 MG PO TABS: 4.0000 mg | ORAL_TABLET | Freq: Four times a day (QID) | ORAL | Status: DC | PRN

## 2018-03-06 MED ORDER — MAGNESIUM OXIDE 400 (241.3 MG) MG PO TABS
400.0000 mg | ORAL_TABLET | Freq: Every day | ORAL | Status: DC
  Administered 2018-03-06 – 2018-03-11 (×6): 400 mg via ORAL
  Filled 2018-03-06 (×6): qty 1

## 2018-03-06 MED ORDER — METOLAZONE 5 MG PO TABS
5.0000 mg | ORAL_TABLET | ORAL | Status: DC
  Administered 2018-03-10: 5 mg via ORAL
  Filled 2018-03-06: qty 1

## 2018-03-06 MED ORDER — NITROGLYCERIN 0.4 MG SL SUBL: 0.4000 mg | SUBLINGUAL_TABLET | SUBLINGUAL | Status: DC | PRN

## 2018-03-06 MED ORDER — VITAMIN D 1000 UNITS PO TABS
1000.0000 [IU] | ORAL_TABLET | Freq: Every day | ORAL | Status: DC
  Administered 2018-03-06 – 2018-03-11 (×6): 1000 [IU] via ORAL
  Filled 2018-03-06 (×6): qty 1

## 2018-03-06 MED ORDER — ACETAMINOPHEN 325 MG PO TABS: 650.0000 mg | ORAL_TABLET | Freq: Four times a day (QID) | ORAL | Status: DC | PRN

## 2018-03-06 MED ORDER — PREGABALIN 75 MG PO CAPS
150.0000 mg | ORAL_CAPSULE | Freq: Two times a day (BID) | ORAL | Status: DC
  Administered 2018-03-06 – 2018-03-11 (×11): 150 mg via ORAL
  Filled 2018-03-06: qty 6
  Filled 2018-03-06 (×5): qty 2
  Filled 2018-03-06: qty 6
  Filled 2018-03-06 (×5): qty 2

## 2018-03-06 MED ORDER — TORSEMIDE 20 MG PO TABS
80.0000 mg | ORAL_TABLET | Freq: Two times a day (BID) | ORAL | Status: DC
Start: 2018-03-06 — End: 2018-03-10
  Administered 2018-03-06 – 2018-03-10 (×8): 80 mg via ORAL
  Filled 2018-03-06 (×9): qty 4

## 2018-03-06 MED ORDER — HYDROCODONE-ACETAMINOPHEN 5-325 MG PO TABS
2.0000 | ORAL_TABLET | Freq: Once | ORAL | Status: AC
  Administered 2018-03-06: 2 via ORAL
  Filled 2018-03-06: qty 2

## 2018-03-06 MED ORDER — ACETAMINOPHEN 650 MG RE SUPP: 650.0000 mg | Freq: Four times a day (QID) | RECTAL | Status: DC | PRN

## 2018-03-06 MED ORDER — SODIUM CHLORIDE 0.9 % IV SOLN
1.0000 g | INTRAVENOUS | Status: DC
  Administered 2018-03-06 – 2018-03-08 (×3): 1 g via INTRAVENOUS
  Filled 2018-03-06 (×4): qty 10

## 2018-03-06 MED ORDER — ONDANSETRON HCL 4 MG/2ML IJ SOLN
4.0000 mg | Freq: Four times a day (QID) | INTRAMUSCULAR | Status: DC | PRN
  Administered 2018-03-06 – 2018-03-10 (×3): 4 mg via INTRAVENOUS
  Filled 2018-03-06 (×3): qty 2

## 2018-03-06 MED ORDER — ONDANSETRON 4 MG PO TBDP
4.0000 mg | ORAL_TABLET | Freq: Three times a day (TID) | ORAL | Status: DC | PRN
  Administered 2018-03-06: 4 mg via ORAL
  Filled 2018-03-06: qty 1

## 2018-03-06 MED ORDER — ESCITALOPRAM OXALATE 10 MG PO TABS
20.0000 mg | ORAL_TABLET | Freq: Every day | ORAL | Status: DC
  Administered 2018-03-06 – 2018-03-11 (×6): 20 mg via ORAL
  Filled 2018-03-06: qty 2
  Filled 2018-03-06: qty 1
  Filled 2018-03-06: qty 2
  Filled 2018-03-06: qty 1
  Filled 2018-03-06 (×3): qty 2

## 2018-03-06 MED ORDER — ROSUVASTATIN CALCIUM 10 MG PO TABS
20.0000 mg | ORAL_TABLET | Freq: Every day | ORAL | Status: DC
  Administered 2018-03-06 – 2018-03-11 (×6): 20 mg via ORAL
  Filled 2018-03-06: qty 1
  Filled 2018-03-06 (×3): qty 2
  Filled 2018-03-06: qty 1
  Filled 2018-03-06: qty 2

## 2018-03-06 MED ORDER — HYDROCODONE-ACETAMINOPHEN 5-325 MG PO TABS
1.0000 | ORAL_TABLET | ORAL | Status: DC | PRN
  Administered 2018-03-06 – 2018-03-09 (×2): 1 via ORAL
  Filled 2018-03-06 (×2): qty 1

## 2018-03-06 MED ORDER — LOSARTAN POTASSIUM 50 MG PO TABS
50.0000 mg | ORAL_TABLET | Freq: Every day | ORAL | Status: DC
  Administered 2018-03-06 – 2018-03-11 (×6): 50 mg via ORAL
  Filled 2018-03-06 (×7): qty 1

## 2018-03-06 MED ORDER — ALBUTEROL SULFATE (2.5 MG/3ML) 0.083% IN NEBU: 3.0000 mL | INHALATION_SOLUTION | Freq: Four times a day (QID) | RESPIRATORY_TRACT | Status: DC | PRN

## 2018-03-06 NOTE — Progress Notes (Signed)
Pt self administers bolus per sliding scale in [employer] System  Regular insulin per POD sliding scale  80-150 = 5u 150-200 = 7u 201-250 = 10u 251-300 = 12u 301-400 = 15u

## 2018-03-06 NOTE — Progress Notes (Signed)
Pharmacy Antibiotic Note  Susan [name] is a 67 y.o. female admitted on 03/05/2018 with cellulitis.  Pharmacy has been consulted for Vancomycin dosing. Cellulitis of the hand. WBC WNL. Noted renal dysfunction.   Plan: Vancomycin 2000 mg IV x 1, then 1250 mg IV q24h Trend WBC, temp, renal function  F/U infectious work-up Drug levels as indicated   Height: 5\' 5"  (165.1 cm) Weight: 274 lb 14.6 oz (124.7 kg) IBW/kg (Calculated) : 57  Temp (24hrs), Avg:97.9 F (36.6 C), Min:97.9 F (36.6 C), Max:97.9 F (36.6 C)  Recent Labs  Lab 03/05/18 1911 03/05/18 2259  WBC 5.6  --   CREATININE 1.93*  --   LATICACIDVEN  --  0.77    Estimated Creatinine Clearance: 37 mL/min (A) (by C-G formula based on SCr of 1.93 mg/dL (H)).    Allergies  Allergen Reactions  . Codeine Nausea And Vomiting    Narda Bonds 03/06/2018 2:15 AM

## 2018-03-06 NOTE — Progress Notes (Signed)
Patient admitted after midnight, please see H&P.  Here with cellulitis.  Needs continued IV abx.  Has insulin pump, diabetic coordinator consult.  Elevate extremities.  Eulogio Bear DO

## 2018-03-06 NOTE — H&P (Signed)
History and Physical    Susan [name] AOZ:[id number] DOB: 24-Jun-1950 DOA: 03/05/2018  PCP: Alycia Rossetti, MD  Patient coming from: Home.  Chief Complaint: Pain and swelling of the left hand.  HPI: Susan [name] is a 66 y.o. 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 Sharol Given started noticing increasing swelling and pain in the left hand unable to flex the fingers.  Since patient's symptoms progressed patient came to the ER.  Denies any trauma or insect bite.  ED Course: X-rays in the ER do not show any bony involvement.  On-call hand surgeon Dr. Grandville Silos was consulted and at this time requested admission for further observation and antibiotic started.  On my exam patient has significant tenderness on the hand.  Pulses are felt.  Review of Systems: As per HPI, rest all negative.   Past Medical History:  Diagnosis Date  . Acute MI ([place]) 1999; 2007  . Anemia    hx  . Anginal pain ([place])   . Anxiety   . ARF (acute renal failure) ([place]) 06/2017   [facility]  . Arthritis    "generalized" (03/15/2014)  . CAD (coronary artery disease)    MI in 2000 - MI  2007 - treated bare metal stent (no nuclear since then as 9/11)  . Carotid artery disease ([place])   . CHF (congestive heart failure) ([place])   . Chronic diastolic heart failure (HCC)    a) ECHO (08/2013) EF 55-60% and RV function nl b) RHC (08/2013) RA 4, RV 30/5/7, PA 25/10 (16), PCWP 7, Fick CO/CI 6.3/2.7, PVR 1.5 WU, PA 61 and 66%  . Daily headache    "~ every other day; since I fell in June" (03/15/2014)  . Depression   . Dyslipidemia   . Dyspnea   . Exertional shortness of breath   . HTN (hypertension)   . Hypothyroidism   . Neuropathy   . Obesity   . Osteoarthritis   . Peripheral neuropathy   . PONV (postoperative nausea and  vomiting)   . RBBB (right bundle branch block)    Old  . Stroke [facility])    mini strokes  . Syncope    likely due to low blood sugar  . Tachycardia    Sinus tachycardia  . Type II diabetes mellitus (HCC)    Type II  . Urinary incontinence   . Venous insufficiency     Past Surgical History:  Procedure Laterality Date  . ABDOMINAL HYSTERECTOMY  1980's  . AMPUTATION Right 02/24/2018   Procedure: RIGHT FOOT GREAT TOE AND 2ND TOE AMPUTATION;  Surgeon: Newt Minion, MD;  Location: [facility];  Service: Orthopedics;  Laterality: Right;  . CATARACT EXTRACTION, BILATERAL Bilateral ?2013  . COLONOSCOPY W/ POLYPECTOMY    . CORONARY ANGIOPLASTY WITH STENT PLACEMENT  1999; 2007   "1 + 1"  . EYE SURGERY Bilateral    [facility]  . KNEE ARTHROSCOPY Left 10/25/2006  . RIGHT HEART CATH N/A 07/24/2017   Procedure: RIGHT HEART CATH;  Surgeon: Jolaine Artist, MD;  Location: [facility];  Service: Cardiovascular;  Laterality: N/A;  . RIGHT HEART CATHETERIZATION N/A 09/22/2013   Procedure: RIGHT HEART CATH;  Surgeon: Jolaine Artist, MD;  Location: [facility] CATH [facility];  Service: Cardiovascular;  Laterality: N/A;  . SHOULDER ARTHROSCOPY WITH  OPEN ROTATOR CUFF REPAIR Right 03/14/2014   Procedure: RIGHT SHOULDER ARTHROSCOPY WITH BICEPS RELEASE, OPEN SUBSCAPULA REPAIR, OPEN SUPRASPINATUS REPAIR.;  Surgeon: Meredith Pel, MD;  Location: [facility];  Service: Orthopedics;  Laterality: Right;  . TOE AMPUTATION Right 02/24/2018   GREAT TOE AND 2ND TOE AMPUTATION  . TUBAL LIGATION  1970's     reports that she quit smoking about 20 years ago. Her smoking use included cigarettes. She has a 96.00 pack-year smoking history. She has never used smokeless tobacco. She reports that she drank alcohol. She reports that she does not use drugs.  Allergies  Allergen Reactions  . Codeine Nausea And Vomiting    Family History  Problem Relation Age of Onset  . Heart attack Mother 66    Prior to Admission medications     Medication Sig Start Date End Date Taking? Authorizing Provider  albuterol (PROVENTIL HFA;VENTOLIN HFA) 108 (90 BASE) MCG/ACT inhaler Inhale 2 puffs into the lungs every 6 (six) hours as needed for wheezing or shortness of breath. Reported on 10/30/2015   Yes [provider]  aspirin EC 81 MG tablet Take 81 mg by mouth daily.   Yes [provider]  carvedilol (COREG) 12.5 MG tablet TAKE 1 TABLET BY MOUTH TWICE A DAY WITH A MEAL 03/01/18  Yes [place], [name], MD  cholecalciferol (VITAMIN D) 1000 units tablet Take 1,000 Units by mouth daily.   Yes [provider]  clopidogrel (PLAVIX) 75 MG tablet TAKE 1 TABLET BY MOUTH DAILY WITH BREAKFAST. Patient taking differently: Take 75 mg by mouth daily.  11/23/17  Yes Larey Dresser, MD  escitalopram (LEXAPRO) 20 MG tablet Take 1 tablet (20 mg total) by mouth daily. 10/26/17  Yes [place], [name], MD  ferrous sulfate 325 (65 FE) MG tablet Take 325 mg by mouth daily with breakfast. Reported on 08/21/2015   Yes [provider]  HYDROcodone-acetaminophen (NORCO/VICODIN) 5-325 MG tablet Take 1 tablet by mouth every 4 (four) hours as needed for moderate pain. 02/25/18  Yes Newt Minion, MD  Insulin Human (INSULIN PUMP) SOLN Inject into the skin.   Yes [provider]  insulin regular (NOVOLIN R,HUMULIN R) 100 units/mL injection 3 (three) times daily before meals.   Yes [provider]  isosorbide dinitrate (ISORDIL) 10 MG tablet TAKE 1 TABLET BY MOUTH TWICE A DAY Patient taking differently: Take 10 mg by mouth 2 (two) times daily.  08/17/17  Yes Bensimhon, Shaune Pascal, MD  levothyroxine (SYNTHROID, LEVOTHROID) 50 MCG tablet Take 1 tablet (50 mcg total) by mouth daily before breakfast. 11/07/16  Yes Dena Billet B, PA-C  losartan (COZAAR) 50 MG tablet Take 1 tablet (50 mg total) by mouth daily. 02/12/18  Yes [place], [name], MD  metolazone (ZAROXOLYN) 5 MG tablet Take 1 tablet (5 mg total) by mouth once a week. 09/25/17   Yes [place], [name], MD  [facility] VERIO test strip  11/02/17  Yes [provider]  pregabalin (LYRICA) 150 MG capsule Take 1 capsule (150 mg total) by mouth 2 (two) times daily. 11/12/16  Yes [place], [name], MD  promethazine (PHENERGAN) 12.5 MG tablet Take 1 tablet (12.5 mg total) by mouth every 6 (six) hours as needed for nausea or vomiting. 03/02/18  Yes [place], Neta Mends, PA-C  rosuvastatin (CRESTOR) 20 MG tablet TAKE 1 TABLET BY MOUTH EVERY DAY Patient taking differently: Take 20 mg by mouth daily.  12/30/17  Yes , [name], MD  torsemide (DEMADEX) 20 MG  tablet Take 80 mg by mouth 2 (two) times daily.   Yes [provider]  vitamin E (VITAMIN E) 1000 UNIT capsule Take 1,000 Units by mouth daily.   Yes [provider]  Magnesium Oxide 400 (240 Mg) MG TABS TAKE 1 TABLET BY MOUTH EVERY DAY Patient taking differently: TAKE 240 mg TABLET BY MOUTH EVERY DAY 06/23/17   [name], MD  metolazone (ZAROXOLYN) 2.5 MG tablet TAKE 1 TABLET BY MOUTH ONCE A WEEK. Patient not taking: Reported on 03/05/2018 02/22/18   Bensimhon, Shaune Pascal, MD  nitroGLYCERIN (NITROSTAT) 0.4 MG SL tablet PLACE 1 TABLET (0.4 MG TOTAL) UNDER THE TONGUE EVERY 5 (FIVE) MINUTES AS NEEDED FOR CHEST PAIN. 08/28/16   [name], PA-C  ondansetron (ZOFRAN) 4 MG tablet Take 1 tablet (4 mg total) by mouth every 8 (eight) hours as needed for nausea or vomiting. 01/13/18   Alycia Rossetti, MD    Physical Exam: Vitals:   03/05/18 2200 03/05/18 2215 03/05/18 2230 03/06/18 0147  BP: (!) 178/76 (!) 176/77 (!) 173/84 (!) 166/82  Pulse: 66 75 79 83  Resp:    17  Temp:    97.9 F (36.6 C)  TempSrc:    Oral  SpO2: 100% 97% 97% 93%  Weight:      Height:          Constitutional: Moderately built and nourished. Vitals:   03/05/18 2200 03/05/18 2215 03/05/18 2230 03/06/18 0147  BP: (!) 178/76 (!) 176/77 (!) 173/84 (!) 166/82  Pulse: 66 75 79 83  Resp:    17  Temp:    97.9 F (36.6 C)   TempSrc:    Oral  SpO2: 100% 97% 97% 93%  Weight:      Height:       Eyes: Anicteric no pallor. ENMT: No discharge from the ears eyes nose or mouth. Neck: No mass felt.  No neck rigidity.  No JVD appreciated. Respiratory: No rhonchi or crepitations. Cardiovascular: S1-S2 heard no murmurs appreciated. Abdomen: Soft nontender bowel sounds present. Musculoskeletal: Swelling of the left hand with unable to flex.  Pulses felt. Skin: Mild erythema of the left wrist and hand. Neurologic: Alert awake oriented to time place and person.  Moves all extremities. Psychiatric: Appears normal.   Labs on Admission: I have personally reviewed following labs and imaging studies  CBC: Recent Labs  Lab 03/05/18 1911  WBC 5.6  NEUTROABS 4.0  HGB 10.1*  HCT 34.2*  MCV 79.4*  PLT 315   Basic Metabolic Panel: Recent Labs  Lab 03/05/18 1911  NA 138  K 3.7  CL 104  CO2 26  GLUCOSE 154*  BUN 45*  CREATININE 1.93*  CALCIUM 8.8*   GFR: Estimated Creatinine Clearance: 37 mL/min (A) (by C-G formula based on SCr of 1.93 mg/dL (H)). Liver Function Tests: No results for input(s): AST, ALT, ALKPHOS, BILITOT, PROT, ALBUMIN in the last 168 hours. No results for input(s): LIPASE, AMYLASE in the last 168 hours. No results for input(s): AMMONIA in the last 168 hours. Coagulation Profile: No results for input(s): INR, PROTIME in the last 168 hours. Cardiac Enzymes: No results for input(s): CKTOTAL, CKMB, CKMBINDEX, TROPONINI in the last 168 hours. BNP (last 3 results) No results for input(s): PROBNP in the last 8760 hours. HbA1C: No results for input(s): HGBA1C in the last 72 hours. CBG: No results for input(s): GLUCAP in the last 168 hours. Lipid Profile: No results for input(s): CHOL, HDL, LDLCALC, TRIG, CHOLHDL, LDLDIRECT in  the last 72 hours. Thyroid Function Tests: No results for input(s): TSH, T4TOTAL, FREET4, T3FREE, THYROIDAB in the last 72 hours. Anemia Panel: No results for  input(s): VITAMINB12, FOLATE, FERRITIN, TIBC, IRON, RETICCTPCT in the last 72 hours. Urine analysis:    Component Value Date/Time   COLORURINE YELLOW 07/22/2017 2053   APPEARANCEUR CLOUDY (A) 07/22/2017 2053   LABSPEC 1.012 07/22/2017 2053   PHURINE 5.0 07/22/2017 2053   GLUCOSEU NEGATIVE 07/22/2017 2053   HGBUR SMALL (A) 07/22/2017 2053   BILIRUBINUR NEGATIVE 07/22/2017 2053   KETONESUR NEGATIVE 07/22/2017 2053   PROTEINUR 30 (A) 07/22/2017 2053   UROBILINOGEN 0.2 04/01/2014 1659   NITRITE NEGATIVE 07/22/2017 2053   LEUKOCYTESUR LARGE (A) 07/22/2017 2053   Sepsis Labs: @LABRCNTIP (procalcitonin:4,lacticidven:4) )No results found for this or any previous visit (from the past 240 hour(s)).   Radiological Exams on Admission: Dg Hand Complete Left  Result Date: 03/05/2018 CLINICAL DATA:  Initial evaluation for acute left hand pain, erythema, edema. EXAM: LEFT HAND - COMPLETE 3+ VIEW COMPARISON:  None. FINDINGS: No acute fracture dislocation. Mild osteoarthritic changes present about the hand, most notable at the first IP joint. Osseous mineralization normal. Mild diffuse soft tissue swelling about the hand, most notable at the ulnar aspect. No abnormal soft tissue emphysema. No radiopaque foreign body. No osseous erosive changes. IMPRESSION: 1. Mild diffuse soft tissue swelling about the hand. 2. No other acute osseous abnormality. Electronically Signed   By: Jeannine Boga M.D.   On: 03/05/2018 19:35    Assessment/Plan Principal Problem:   Cellulitis of hand, left Active Problems:   Diabetes mellitus type II, uncontrolled (HCC)   Chronic diastolic CHF (congestive heart failure) ([place])   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 mellitus (HCC)   PAH (pulmonary artery hypertension) ([place])   PVD (peripheral vascular disease) ([place])    1. Cellulitis of the left hand -appreciate hand surgery input.  Will keep patient on antibiotics for now.  Check uric acid levels sed rate and reconsult  and surgeon if symptoms do not improve with antibiotics. 2. Recent right foot great and second toe amputation by Dr. Sharol Given.  Will get wound team consult and please notify Dr. Sharol Given. 3. Diabetes mellitus type 2 on insulin pump. 4. Chronic diastolic CHF on torsemide which will be continued.  Not in any respiratory distress. 5. Chronic kidney disease stage III-IV anemia appears to be at baseline. 6. Hypertension on Cozaar and Coreg.  Note that patient has chronic kidney disease and patient is on Cozaar so closely follow metabolic panel given that patient is also on antibiotics. 7. History of peripheral vascular disease. 8. Anemia likely from renal disease. 9. History of CAD -denies any chest pain.  Antiplatelet agents on hold in anticipation of possible procedure for the hand.   DVT prophylaxis: SCDs in anticipation of possible procedure. Code Status: Full code. Family Communication: Discussed with patient. Disposition Plan: Home. Consults called: Dr. Grandville Silos orthopedic surgeon. Admission status: Observation.   Rise Patience MD [facility] Pager [phone].  If 7PM-7AM, please contact night-coverage [email or web address] Password [email or web address]  03/06/2018, 2:10 AM

## 2018-03-06 NOTE — Progress Notes (Signed)
I was called by EDP regarding this patient's hand predicament.  She has been noted to have redness/swellling of LF and dorsum of hand.  Digits rests in extension, WBC 5.6.  EDP indicated no clinical suspicion for surgical indication (abscess, deep space purulent infection, septic arthritis) at this time.  I have reviewed the clinical photos and agree there is no indication for hand surgery at this time.  I'd be happy to formally consult on this patient if, at some point, clear indications emerge where she would benefit from surgical intervention. In light of her recent medical/surgical history, I recommended she be at least admitted for IV antibiotics and close observation to determine response to treatment. During her admission, I'd further recommend SCDs and no pharmacologic VTE prophylaxis in the event surgical indications arise.  Micheline Rough, MD Hand Surgery

## 2018-03-07 DIAGNOSIS — N184 Chronic kidney disease, stage 4 (severe): Secondary | ICD-10-CM | POA: Diagnosis present

## 2018-03-07 DIAGNOSIS — I872 Venous insufficiency (chronic) (peripheral): Secondary | ICD-10-CM | POA: Diagnosis present

## 2018-03-07 DIAGNOSIS — M109 Gout, unspecified: Secondary | ICD-10-CM | POA: Diagnosis present

## 2018-03-07 DIAGNOSIS — Z9181 History of falling: Secondary | ICD-10-CM | POA: Diagnosis not present

## 2018-03-07 DIAGNOSIS — Z9641 Presence of insulin pump (external) (internal): Secondary | ICD-10-CM | POA: Diagnosis present

## 2018-03-07 DIAGNOSIS — E1151 Type 2 diabetes mellitus with diabetic peripheral angiopathy without gangrene: Secondary | ICD-10-CM | POA: Diagnosis present

## 2018-03-07 DIAGNOSIS — I25119 Atherosclerotic heart disease of native coronary artery with unspecified angina pectoris: Secondary | ICD-10-CM | POA: Diagnosis present

## 2018-03-07 DIAGNOSIS — Z6841 Body Mass Index (BMI) 40.0 and over, adult: Secondary | ICD-10-CM | POA: Diagnosis not present

## 2018-03-07 DIAGNOSIS — I5032 Chronic diastolic (congestive) heart failure: Secondary | ICD-10-CM | POA: Diagnosis present

## 2018-03-07 DIAGNOSIS — R4182 Altered mental status, unspecified: Secondary | ICD-10-CM | POA: Diagnosis not present

## 2018-03-07 DIAGNOSIS — E039 Hypothyroidism, unspecified: Secondary | ICD-10-CM | POA: Diagnosis present

## 2018-03-07 DIAGNOSIS — Z955 Presence of coronary angioplasty implant and graft: Secondary | ICD-10-CM | POA: Diagnosis not present

## 2018-03-07 DIAGNOSIS — E43 Unspecified severe protein-calorie malnutrition: Secondary | ICD-10-CM | POA: Diagnosis not present

## 2018-03-07 DIAGNOSIS — R0902 Hypoxemia: Secondary | ICD-10-CM | POA: Diagnosis not present

## 2018-03-07 DIAGNOSIS — I252 Old myocardial infarction: Secondary | ICD-10-CM | POA: Diagnosis not present

## 2018-03-07 DIAGNOSIS — E1122 Type 2 diabetes mellitus with diabetic chronic kidney disease: Secondary | ICD-10-CM | POA: Diagnosis present

## 2018-03-07 DIAGNOSIS — L03114 Cellulitis of left upper limb: Secondary | ICD-10-CM | POA: Diagnosis present

## 2018-03-07 DIAGNOSIS — E1165 Type 2 diabetes mellitus with hyperglycemia: Secondary | ICD-10-CM | POA: Diagnosis present

## 2018-03-07 DIAGNOSIS — M65332 Trigger finger, left middle finger: Secondary | ICD-10-CM | POA: Diagnosis present

## 2018-03-07 DIAGNOSIS — R0603 Acute respiratory distress: Secondary | ICD-10-CM | POA: Diagnosis not present

## 2018-03-07 DIAGNOSIS — I959 Hypotension, unspecified: Secondary | ICD-10-CM | POA: Diagnosis not present

## 2018-03-07 DIAGNOSIS — M1A0420 Idiopathic chronic gout, left hand, without tophus (tophi): Secondary | ICD-10-CM | POA: Diagnosis not present

## 2018-03-07 DIAGNOSIS — K5900 Constipation, unspecified: Secondary | ICD-10-CM | POA: Diagnosis present

## 2018-03-07 DIAGNOSIS — I130 Hypertensive heart and chronic kidney disease with heart failure and stage 1 through stage 4 chronic kidney disease, or unspecified chronic kidney disease: Secondary | ICD-10-CM | POA: Diagnosis present

## 2018-03-07 DIAGNOSIS — D649 Anemia, unspecified: Secondary | ICD-10-CM | POA: Diagnosis present

## 2018-03-07 DIAGNOSIS — E790 Hyperuricemia without signs of inflammatory arthritis and tophaceous disease: Secondary | ICD-10-CM | POA: Diagnosis not present

## 2018-03-07 DIAGNOSIS — I2721 Secondary pulmonary arterial hypertension: Secondary | ICD-10-CM | POA: Diagnosis present

## 2018-03-07 LAB — BASIC METABOLIC PANEL
Anion gap: 14 (ref 5–15)
BUN: 33 mg/dL — ABNORMAL HIGH (ref 8–23)
CO2: 21 mmol/L — ABNORMAL LOW (ref 22–32)
Calcium: 8.5 mg/dL — ABNORMAL LOW (ref 8.9–10.3)
Chloride: 104 mmol/L (ref 98–111)
Creatinine, Ser: 1.67 mg/dL — ABNORMAL HIGH (ref 0.44–1.00)
GFR calc Af Amer: 35 mL/min — ABNORMAL LOW (ref >60)
GFR calc non Af Amer: 30 mL/min — ABNORMAL LOW (ref >60)
Glucose, Bld: 96 mg/dL (ref 70–99)
Potassium: 4.1 mmol/L (ref 3.5–5.1)
Sodium: 139 mmol/L (ref 135–145)

## 2018-03-07 LAB — CBC
HCT: 32.8 % — ABNORMAL LOW (ref 36.0–46.0)
Hemoglobin: 9.5 g/dL — ABNORMAL LOW (ref 12.0–15.0)
MCH: 22.6 pg — ABNORMAL LOW (ref 26.0–34.0)
MCHC: 29 g/dL — ABNORMAL LOW (ref 30.0–36.0)
MCV: 77.9 fL — ABNORMAL LOW (ref 80.0–100.0)
Platelets: 321 10*3/uL (ref 150–400)
RBC: 4.21 MIL/uL (ref 3.87–5.11)
RDW: 15.9 % — ABNORMAL HIGH (ref 11.5–15.5)
WBC: 4.1 10*3/uL (ref 4.0–10.5)
nRBC: 0 % (ref 0.0–0.2)

## 2018-03-07 MED ORDER — CLOPIDOGREL BISULFATE 75 MG PO TABS
75.0000 mg | ORAL_TABLET | Freq: Every day | ORAL | Status: DC
  Administered 2018-03-08 – 2018-03-11 (×4): 75 mg via ORAL
  Filled 2018-03-07 (×4): qty 1

## 2018-03-07 MED ORDER — METHYLPREDNISOLONE SODIUM SUCC 40 MG IJ SOLR
40.0000 mg | Freq: Once | INTRAMUSCULAR | Status: AC
  Administered 2018-03-07: 40 mg via INTRAVENOUS
  Filled 2018-03-07: qty 1

## 2018-03-07 MED ORDER — SENNOSIDES-DOCUSATE SODIUM 8.6-50 MG PO TABS
1.0000 | ORAL_TABLET | Freq: Two times a day (BID) | ORAL | Status: DC
  Administered 2018-03-07 – 2018-03-11 (×9): 1 via ORAL
  Filled 2018-03-07 (×9): qty 1

## 2018-03-07 MED ORDER — ASPIRIN EC 81 MG PO TBEC
81.0000 mg | DELAYED_RELEASE_TABLET | Freq: Every day | ORAL | Status: DC
  Administered 2018-03-07 – 2018-03-11 (×5): 81 mg via ORAL
  Filled 2018-03-07 (×5): qty 1

## 2018-03-07 MED ORDER — PREDNISONE 20 MG PO TABS
20.0000 mg | ORAL_TABLET | Freq: Every day | ORAL | Status: DC
  Administered 2018-03-08 – 2018-03-11 (×4): 20 mg via ORAL
  Filled 2018-03-07 (×4): qty 1

## 2018-03-07 NOTE — Progress Notes (Signed)
Inpatient Diabetes Program Recommendations  AACE/ADA: New Consensus Statement on Inpatient Glycemic Control (2015)  Target Ranges:  Prepandial:   less than 140 mg/dL      Peak postprandial:   less than 180 mg/dL (1-2 hours)      Critically ill patients:  140 - 180 mg/dL   Lab Results  Component Value Date   GLUCAP 138 (H) 03/06/2018   HGBA1C 9.3 (H) 09/25/2017    Review of Glycemic Control    Inpatient Diabetes Program Recommendations:    Patient has an Omnipod DASH insulin pump that she has just recently received. Patient has had diabetes for about 10 years. Sees Dr. [name] for her diabetes care.   Omnipod DASH basal insulin rates: 1.25 units per hour = 30 units total/day. Boluses are based on sliding scale per pump. Does not cover CHO with insulin on pump at this time. Patient states that she does take NPH insulin 10 units every am if blood sugar >200 mg/dl.    Will continue to monitor blood sugars while in the hospital.   Thank you. Lorenda Peck, RD, LDN, CDE Inpatient Diabetes Coordinator [phone]

## 2018-03-07 NOTE — Progress Notes (Signed)
PT A/O , Dsy to bil feet dry and intact. Pt has been using the [facility] but reports she wants to get up to the [facility] for BM. Pt reports she used a walker at home but because of Lt hand cellulitis she can not grip the walker tightly.

## 2018-03-07 NOTE — Progress Notes (Signed)
Report called to [facility]. Pt transported via Hospital bed by NT

## 2018-03-07 NOTE — Progress Notes (Addendum)
Progress Note    Susan [name]  ATF:[id number] DOB: Oct 18, 1950  DOA: 03/05/2018 PCP: Alycia Rossetti, MD    Brief Narrative:    Medical records reviewed and are as summarized below:  Susan [name] is an 67 y.o. 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 Sharol Given started noticing increasing swelling and pain in the left hand unable to flex the fingers.  Since patient's symptoms progressed patient came to the ER.  Denies any trauma or insect bite.  Assessment/Plan:   Principal Problem:   Cellulitis of hand, left Active Problems:   Diabetes mellitus type II, uncontrolled (HCC)   Chronic diastolic CHF (congestive heart failure) (HCC)   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 mellitus (HCC)   PAH (pulmonary artery hypertension) (HCC)   PVD (peripheral vascular disease) ([place])   Cellulitis  Cellulitis of the left hand  -appreciate hand surgery input: no indication for hand surgery at this time -IV abx continue -upon re-examination this PM, redness slightly improved but finger still swollen and sore -uric acid elevated-- start low dose prednisone and monitor for improvement -continue elevation  Recent right foot great and second toe amputation by Dr. Sharol Given.   -please notify Dr. Sharol Given in the AM of need to see patient in hospital (has missed outpatient appointment)  Diabetes mellitus type 2 on insulin pump -tight control especially in light of low dose prednisone  Chronic diastolic CHF  Chronic kidney disease stage III - appears to be at baseline.  Hypertension  -Cozaar and Coreg.    History of peripheral vascular disease.  Anemia likely from renal disease.  History of CAD -denies any chest pain, resume home meds  Constipation -no BM for 1 week -start bowel regimen and monitor for  response  obesity Body mass index is 47.33 kg/m.  AS the patient needs continued IV Abx as she has not had marked improvement in observation, will change to inpt.  She is a diabetic and high risk for complications.   Family Communication/Anticipated D/C date and plan/Code Status   DVT prophylaxis: scd Code Status: Full Code.  Family Communication: patient Disposition Plan: 24-48 more hours of IV abx   Medical Consultants:    Will need to notify Sharol Given in the AM     Subjective:   Hand still painful and hard to move fingers  Objective:    Vitals:   03/06/18 1238 03/06/18 1700 03/06/18 2125 03/07/18 0535  BP: (!) 141/69 (!) 153/66 (!) 153/65 (!) 107/53  Pulse: 70 70 74 69  Resp: 16 16 15 14   Temp: 97.8 F (36.6 C) 98.6 F (37 C)    TempSrc: Oral Oral    SpO2: 94% 97% 95% 96%  Weight:    129 kg  Height:        Intake/Output Summary (Last 24 hours) at 03/07/2018 1329 Last data filed at 03/07/2018 0000 Gross per 24 hour  Intake -  Output 900 ml  Net -900 ml   Filed Weights   03/05/18 2142 03/07/18 0535  Weight: 124.7 kg 129 kg    Exam: In bed, NAD Left middle finger swollen (this AM was red but upon re-eval this PM, improved)-- still very tender to palpation Hand with less redness and swelling +BS, soft, NT A+Ox3  Data Reviewed:   I have  personally reviewed following labs and imaging studies:  Labs: Labs show the following:   Basic Metabolic Panel: Recent Labs  Lab 03/05/18 1911 03/06/18 0422 03/07/18 0622  NA 138 140 139  K 3.7 3.5 4.1  CL 104 107 104  CO2 26 24 21*  GLUCOSE 154* 132* 96  BUN 45* 40* 33*  CREATININE 1.93* 1.60* 1.67*  CALCIUM 8.8* 8.5* 8.5*   GFR Estimated Creatinine Clearance: 43.7 mL/min (A) (by C-G formula based on SCr of 1.67 mg/dL (H)). Liver Function Tests: No results for input(s): AST, ALT, ALKPHOS, BILITOT, PROT, ALBUMIN in the last 168 hours. No results for input(s): LIPASE, AMYLASE in the last 168  hours. No results for input(s): AMMONIA in the last 168 hours. Coagulation profile No results for input(s): INR, PROTIME in the last 168 hours.  CBC: Recent Labs  Lab 03/05/18 1911 03/06/18 0422 03/07/18 0737  WBC 5.6 4.1 4.1  NEUTROABS 4.0  --   --   HGB 10.1* 9.5* 9.5*  HCT 34.2* 32.2* 32.8*  MCV 79.4* 78.0* 77.9*  PLT 326 302 321   Cardiac Enzymes: No results for input(s): CKTOTAL, CKMB, CKMBINDEX, TROPONINI in the last 168 hours. BNP (last 3 results) No results for input(s): PROBNP in the last 8760 hours. CBG: Recent Labs  Lab 03/06/18 0321 03/06/18 0556 03/06/18 1239 03/06/18 1703  GLUCAP 117* 124* 98 138*   D-Dimer: No results for input(s): DDIMER in the last 72 hours. Hgb A1c: No results for input(s): HGBA1C in the last 72 hours. Lipid Profile: No results for input(s): CHOL, HDL, LDLCALC, TRIG, CHOLHDL, LDLDIRECT in the last 72 hours. Thyroid function studies: No results for input(s): TSH, T4TOTAL, T3FREE, THYROIDAB in the last 72 hours.  Invalid input(s): FREET3 Anemia work up: No results for input(s): VITAMINB12, FOLATE, FERRITIN, TIBC, IRON, RETICCTPCT in the last 72 hours. Sepsis Labs: Recent Labs  Lab 03/05/18 1911 03/05/18 2259 03/06/18 0422 03/07/18 0737  WBC 5.6  --  4.1 4.1  LATICACIDVEN  --  0.77  --   --     Microbiology No results found for this or any previous visit (from the past 240 hour(s)).  Procedures and diagnostic studies:  Dg Hand Complete Left  Result Date: 03/05/2018 CLINICAL DATA:  Initial evaluation for acute left hand pain, erythema, edema. EXAM: LEFT HAND - COMPLETE 3+ VIEW COMPARISON:  None. FINDINGS: No acute fracture dislocation. Mild osteoarthritic changes present about the hand, most notable at the first IP joint. Osseous mineralization normal. Mild diffuse soft tissue swelling about the hand, most notable at the ulnar aspect. No abnormal soft tissue emphysema. No radiopaque foreign body. No osseous erosive changes.  IMPRESSION: 1. Mild diffuse soft tissue swelling about the hand. 2. No other acute osseous abnormality. Electronically Signed   By: Jeannine Boga M.D.   On: 03/05/2018 19:35    Medications:   . carvedilol  12.5 mg Oral BID WC  . cholecalciferol  1,000 Units Oral Daily  . escitalopram  20 mg Oral Daily  . ferrous sulfate  325 mg Oral Q breakfast  . insulin pump   Subcutaneous Q4H  . isosorbide dinitrate  10 mg Oral BID  . levothyroxine  50 mcg Oral QAC breakfast  . losartan  50 mg Oral Daily  . magnesium oxide  400 mg Oral Daily  . [START ON 03/10/2018] metolazone  5 mg Oral Q Wed  . [START ON 03/08/2018] predniSONE  20 mg Oral Q breakfast  . pregabalin  150 mg Oral BID  .  rosuvastatin  20 mg Oral Daily  . senna-docusate  1 tablet Oral BID  . torsemide  80 mg Oral BID   Continuous Infusions: . cefTRIAXone (ROCEPHIN)  IV Stopped (03/06/18 2137)  . vancomycin 1,250 mg (03/06/18 2240)     LOS: 0 days   Geradine Girt  [facility]   *Please refer to [email or web address], password [email or web address] to get updated schedule on who will round on this patient, as hospitalists switch teams weekly. If 7PM-7AM, please contact night-coverage at [email or web address], password [email or web address] for any overnight needs.  03/07/2018, 1:29 PM

## 2018-03-08 ENCOUNTER — Telehealth (INDEPENDENT_AMBULATORY_CARE_PROVIDER_SITE_OTHER): Payer: Self-pay | Admitting: Orthopedic Surgery

## 2018-03-08 ENCOUNTER — Inpatient Hospital Stay (INDEPENDENT_AMBULATORY_CARE_PROVIDER_SITE_OTHER): Payer: PPO | Admitting: Orthopedic Surgery

## 2018-03-08 DIAGNOSIS — M1A0420 Idiopathic chronic gout, left hand, without tophus (tophi): Secondary | ICD-10-CM

## 2018-03-08 DIAGNOSIS — E790 Hyperuricemia without signs of inflammatory arthritis and tophaceous disease: Secondary | ICD-10-CM

## 2018-03-08 DIAGNOSIS — L03114 Cellulitis of left upper limb: Principal | ICD-10-CM

## 2018-03-08 DIAGNOSIS — E43 Unspecified severe protein-calorie malnutrition: Secondary | ICD-10-CM

## 2018-03-08 DIAGNOSIS — E1165 Type 2 diabetes mellitus with hyperglycemia: Secondary | ICD-10-CM

## 2018-03-08 DIAGNOSIS — M65332 Trigger finger, left middle finger: Secondary | ICD-10-CM

## 2018-03-08 LAB — GLUCOSE, CAPILLARY
Glucose-Capillary: 103 mg/dL — ABNORMAL HIGH (ref 70–99)
Glucose-Capillary: 135 mg/dL — ABNORMAL HIGH (ref 70–99)
Glucose-Capillary: 287 mg/dL — ABNORMAL HIGH (ref 70–99)
Glucose-Capillary: 369 mg/dL — ABNORMAL HIGH (ref 70–99)
Glucose-Capillary: 388 mg/dL — ABNORMAL HIGH (ref 70–99)
Glucose-Capillary: 91 mg/dL (ref 70–99)

## 2018-03-08 MED ORDER — COLCHICINE 0.6 MG PO TABS
0.6000 mg | ORAL_TABLET | Freq: Every day | ORAL | Status: DC
  Administered 2018-03-09 – 2018-03-11 (×3): 0.6 mg via ORAL
  Filled 2018-03-08 (×3): qty 1

## 2018-03-08 MED ORDER — ALLOPURINOL 100 MG PO TABS
100.0000 mg | ORAL_TABLET | Freq: Two times a day (BID) | ORAL | Status: DC
  Administered 2018-03-08 – 2018-03-11 (×6): 100 mg via ORAL
  Filled 2018-03-08 (×6): qty 1

## 2018-03-08 MED ORDER — POLYETHYLENE GLYCOL 3350 17 G PO PACK
17.0000 g | PACK | Freq: Every day | ORAL | Status: DC
  Administered 2018-03-08 – 2018-03-11 (×4): 17 g via ORAL
  Filled 2018-03-08 (×4): qty 1

## 2018-03-08 NOTE — Telephone Encounter (Signed)
Patient cancelled her post op appt for today because she is still hospitalized. She said her nurses were supposed to be in contact with Dr. Sharol Given so he could see her on his rounds this morning. She is requesting a call back from you if possible, she doesn't know her phone number but she is on the 5th floor room 21.

## 2018-03-08 NOTE — Consult Note (Signed)
ORTHOPAEDIC CONSULTATION  REQUESTING PHYSICIAN: Annita Brod, MD  Chief Complaint: Pain and swelling and redness with decreased range of motion left long finger  HPI: Susan [name] is a 67 y.o. female who presents with redness pain and swelling left long finger.  Patient denies a history of gout.  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  Past Medical History:  Diagnosis Date  . Acute MI ([place]) 1999; 2007  . Anemia    hx  . Anginal pain ([place])   . Anxiety   . ARF (acute renal failure) ([place]) 06/2017   [facility]  . Arthritis    "generalized" (03/15/2014)  . CAD (coronary artery disease)    MI in 2000 - MI  2007 - treated bare metal stent (no nuclear since then as 9/11)  . Carotid artery disease ([place])   . CHF (congestive heart failure) ([place])   . Chronic diastolic heart failure (HCC)    a) ECHO (08/2013) EF 55-60% and RV function nl b) RHC (08/2013) RA 4, RV 30/5/7, PA 25/10 (16), PCWP 7, Fick CO/CI 6.3/2.7, PVR 1.5 WU, PA 61 and 66%  . Daily headache    "~ every other day; since I fell in June" (03/15/2014)  . Depression   . Dyslipidemia   . Dyspnea   . Exertional shortness of breath   . HTN (hypertension)   . Hypothyroidism   . Neuropathy   . Obesity   . Osteoarthritis   . Peripheral neuropathy   . PONV (postoperative nausea and vomiting)   . RBBB (right bundle branch block)    Old  . Stroke [facility])    mini strokes  . Syncope    likely due to low blood sugar  . Tachycardia    Sinus tachycardia  . Type II diabetes mellitus (HCC)    Type II  . Urinary incontinence   . Venous insufficiency    Past Surgical History:  Procedure Laterality Date  . ABDOMINAL HYSTERECTOMY  1980's  . AMPUTATION Right 02/24/2018   Procedure: RIGHT FOOT GREAT TOE AND 2ND TOE AMPUTATION;  Surgeon: Newt Minion, MD;  Location: [place];  Service: Orthopedics;  Laterality: Right;  .  CATARACT EXTRACTION, BILATERAL Bilateral ?2013  . COLONOSCOPY W/ POLYPECTOMY    . CORONARY ANGIOPLASTY WITH STENT PLACEMENT  1999; 2007   "1 + 1"  . EYE SURGERY Bilateral    [facility]  . KNEE ARTHROSCOPY Left 10/25/2006  . RIGHT HEART CATH N/A 07/24/2017   Procedure: RIGHT HEART CATH;  Surgeon: Jolaine Artist, MD;  Location: [place] CV LAB;  Service: Cardiovascular;  Laterality: N/A;  . RIGHT HEART CATHETERIZATION N/A 09/22/2013   Procedure: RIGHT HEART CATH;  Surgeon: Jolaine Artist, MD;  Location: [facility] CATH LAB;  Service: Cardiovascular;  Laterality: N/A;  . SHOULDER ARTHROSCOPY WITH OPEN ROTATOR CUFF REPAIR Right 03/14/2014   Procedure: RIGHT SHOULDER ARTHROSCOPY WITH BICEPS RELEASE, OPEN SUBSCAPULA REPAIR, OPEN SUPRASPINATUS REPAIR.;  Surgeon: Meredith Pel, MD;  Location: [place];  Service: Orthopedics;  Laterality: Right;  . TOE AMPUTATION Right 02/24/2018   GREAT TOE AND 2ND TOE AMPUTATION  . TUBAL LIGATION  1970's   Social History   Socioeconomic History  . Marital status: Married    Spouse name: Not on file  . Number of children: 3  . Years of education: 12th  . Highest education level: Not on file  Occupational History    Employer: UNEMPLOYED  Social Needs  . Financial resource strain: Not on file  . Food insecurity:    Worry: Not on file    Inability: Not on file  . Transportation needs:    Medical: Not on file    Non-medical: Not on file  Tobacco Use  . Smoking status: Former Smoker    Packs/day: 3.00    Years: 32.00    Pack years: 96.00    Types: Cigarettes    Last attempt to quit: 10/24/1997    Years since quitting: 20.3  . Smokeless tobacco: Never Used  Substance and Sexual Activity  . Alcohol use: Not Currently    Comment: "might have 2-3 daiquiris in the summer"  . Drug use: No  . Sexual activity: Never  Lifestyle  . Physical activity:    Days per week: Not on file    Minutes per session: Not on file  . Stress: Not on file  Relationships  .  Social connections:    Talks on phone: Not on file    Gets together: Not on file    Attends religious service: Not on file    Active member of club or organization: Not on file    Attends meetings of clubs or organizations: Not on file    Relationship status: Not on file  Other Topics Concern  . Not on file  Social History Narrative   Pt lives at home with her spouse.   Caffeine Use- 3 sodas daily   Family History  Problem Relation Age of Onset  . Heart attack Mother 102   - negative except otherwise stated in the family history section Allergies  Allergen Reactions  . Codeine Nausea And Vomiting   Prior to Admission medications   Medication Sig Start Date End Date Taking? Authorizing Provider  albuterol (PROVENTIL HFA;VENTOLIN HFA) 108 (90 BASE) MCG/ACT inhaler Inhale 2 puffs into the lungs every 6 (six) hours as needed for wheezing or shortness of breath. Reported on 10/30/2015   Yes [provider]  aspirin EC 81 MG tablet Take 81 mg by mouth daily.   Yes [provider]  carvedilol (COREG) 12.5 MG tablet TAKE 1 TABLET BY MOUTH TWICE A DAY WITH A MEAL 03/01/18  Yes [place], [name], MD  cholecalciferol (VITAMIN D) 1000 units tablet Take 1,000 Units by mouth daily.   Yes [provider]  clopidogrel (PLAVIX) 75 MG tablet TAKE 1 TABLET BY MOUTH DAILY WITH BREAKFAST. Patient taking differently: Take 75 mg by mouth daily.  11/23/17  Yes Larey Dresser, MD  escitalopram (LEXAPRO) 20 MG tablet Take 1 tablet (20 mg total) by mouth daily. 10/26/17  Yes , [name], MD  ferrous sulfate 325 (65 FE) MG tablet Take 325 mg by mouth daily with breakfast. Reported on 08/21/2015   Yes [provider]  HYDROcodone-acetaminophen (NORCO/VICODIN) 5-325 MG tablet Take 1 tablet by mouth every 4 (four) hours as needed for moderate pain. 02/25/18  Yes Newt Minion, MD  Insulin Human (INSULIN PUMP) SOLN Inject into the skin.   Yes [provider]  insulin  regular (NOVOLIN R,HUMULIN R) 100 units/mL injection 3 (three) times daily before meals.   Yes [provider]  isosorbide dinitrate (ISORDIL) 10 MG tablet TAKE 1 TABLET BY MOUTH TWICE A DAY Patient taking differently: Take 10 mg by mouth 2 (two) times daily.  08/17/17  Yes Bensimhon, Shaune Pascal, MD  levothyroxine (SYNTHROID, LEVOTHROID) 50 MCG tablet Take  1 tablet (50 mcg total) by mouth daily before breakfast. 11/07/16  Yes Dena Billet B, PA-C  losartan (COZAAR) 50 MG tablet Take 1 tablet (50 mg total) by mouth daily. 02/12/18  Yes [place], [name], MD  metolazone (ZAROXOLYN) 5 MG tablet Take 1 tablet (5 mg total) by mouth once a week. 09/25/17  Yes [place], [name], MD  [facility] VERIO test strip  11/02/17  Yes [provider]  pregabalin (LYRICA) 150 MG capsule Take 1 capsule (150 mg total) by mouth 2 (two) times daily. 11/12/16  Yes [place], [name], MD  promethazine (PHENERGAN) 12.5 MG tablet Take 1 tablet (12.5 mg total) by mouth every 6 (six) hours as needed for nausea or vomiting. 03/02/18  Yes [place], Neta Mends, PA-C  rosuvastatin (CRESTOR) 20 MG tablet TAKE 1 TABLET BY MOUTH EVERY DAY Patient taking differently: Take 20 mg by mouth daily.  12/30/17  Yes [place], [name], MD  torsemide (DEMADEX) 20 MG tablet Take 80 mg by mouth 2 (two) times daily.   Yes [provider]  vitamin E (VITAMIN E) 1000 UNIT capsule Take 1,000 Units by mouth daily.   Yes [provider]  Magnesium Oxide 400 (240 Mg) MG TABS TAKE 1 TABLET BY MOUTH EVERY DAY Patient taking differently: TAKE 240 mg TABLET BY MOUTH EVERY DAY 06/23/17   [name], MD  metolazone (ZAROXOLYN) 2.5 MG tablet TAKE 1 TABLET BY MOUTH ONCE A WEEK. Patient not taking: Reported on 03/05/2018 02/22/18   Bensimhon, Shaune Pascal, MD  nitroGLYCERIN (NITROSTAT) 0.4 MG SL tablet PLACE 1 TABLET (0.4 MG TOTAL) UNDER THE TONGUE EVERY 5 (FIVE) MINUTES AS NEEDED FOR CHEST PAIN. 08/28/16   Dena Billet B, PA-C    ondansetron (ZOFRAN) 4 MG tablet Take 1 tablet (4 mg total) by mouth every 8 (eight) hours as needed for nausea or vomiting. 01/13/18   Alycia Rossetti, MD   No results found. - pertinent xrays, CT, MRI studies were reviewed and independently interpreted  Positive ROS: All other systems have been reviewed and were otherwise negative with the exception of those mentioned in the HPI and as above.  Physical Exam: General: Alert, no acute distress Psychiatric: Patient is competent for consent with normal mood and affect Lymphatic: No axillary or cervical lymphadenopathy Cardiovascular: No pedal edema Respiratory: No cyanosis, no use of accessory musculature GI: No organomegaly, abdomen is soft and non-tender    Images:  @ENCIMAGES @  Labs:  Lab Results  Component Value Date   HGBA1C 9.3 (H) 09/25/2017   HGBA1C 11.4 (H) 07/16/2017   HGBA1C 10.2 (H) 02/27/2017   CRP 8.4 (H) 03/05/2018   LABURIC 9.0 (H) 03/06/2018   REPTSTATUS 07/25/2017 FINAL 07/22/2017   CULT >=100,000 COLONIES/mL KLEBSIELLA PNEUMONIAE (A) 07/22/2017   LABORGA KLEBSIELLA PNEUMONIAE (A) 07/22/2017    Lab Results  Component Value Date   ALBUMIN 2.6 (L) 02/14/2018   ALBUMIN 2.1 (L) 07/24/2017   ALBUMIN 2.4 (L) 07/23/2017   LABURIC 9.0 (H) 03/06/2018    Neurologic: Patient does not have protective sensation bilateral lower extremities.   MUSCULOSKELETAL:   Skin: Compression wraps from both lower extremities was removed.  Examination of both legs she has brawny skin color changes but no open ulcers.  The surgical incision for the great toe and second toe amputation has healed nicely no signs of infection.  Examination of her left hand she is tender to palpation of the MCP joint of the left long finger, the other joints are asymptomatic.  She has decreased range of motion and has triggering of the long finger.  Tenderness to palpation over the A1 pulley.   Uric acid is 9 consistent with gout.  Albumin is  2.1-2.6 consistent with severe protein caloric malnutrition and her hemoglobin A1c is between 11.4 and 9.3 consistent with uncontrolled type 2 diabetes.  Assessment: Assessment: Well-healing first and second toe amputations right foot with improving swelling and decreased dermatitis of both lower extremities from venous insufficiency with severe protein caloric malnutrition and a trigger finger of the left long finger with acute gout.  Plan: Patient was written for colchicine to take 0.6 mg a day with allopurinol 100 mg twice a day.  Will apply dry dressing to the right foot and patient will start wearing her knee-high medical compression stockings around-the-clock when she gets these tomorrow.  She is still to maintain touchdown weightbearing on the right.  I will follow-up in 1 week to harvest the sutures.  Thank you for the consult and the opportunity to see Ms. Fabian November, MD [facility] [phone] 5:19 PM

## 2018-03-08 NOTE — Telephone Encounter (Signed)
Dr. Sharol Given is aware.  Will see pt

## 2018-03-08 NOTE — Progress Notes (Addendum)
Inpatient Diabetes Program Recommendations  AACE/ADA: New Consensus Statement on Inpatient Glycemic Control (2015)  Target Ranges:  Prepandial:   less than 140 mg/dL      Peak postprandial:   less than 180 mg/dL (1-2 hours)      Critically ill patients:  140 - 180 mg/dL   Lab Results  Component Value Date   GLUCAP 91 03/07/2018   HGBA1C 9.3 (H) 09/25/2017   History DM2  Patient has an Omnipod DASH insulin pump that she has just recently received. Patient has had diabetes for about 10 years. Sees Dr. [name] for her diabetes care.   Omnipod DASH basal insulin rates: 1.25 units per hour = 30 units total/day. Boluses are based on sliding scale per pump. Does not cover CHO with insulin on pump at this time. Patient states that she does take NPH insulin 10 units every am if blood sugar >200 mg/dl.   Patient currently monitoring Q4 hours her CBG and dosing via insulin pump accordingly. Amounts patient giving herself listed in [facility]. Spoke to patient's RN and reminded her that per insulin pump orders that NT/RN still needs to check CBG with hospital meter. Also gave patient the insulin pump flow sheet for communication btw patient and RN regarding CBG/CHO intake/bolus given via pump. Patient contract also reviewed with patient. Informed patient that she is to have all her supplies available (husband can bring) as hospital doesn't provide pump supplies.  She verbalizes understanding.   Patient had A1c last documented here as 9.3% in May 2019. She said she had one at her endo's office (Dr. Suzette Battiest) last month and it was about the same. Currently CBGs have been running high in the 300s. Diabetes coordinators spoke with patient today and informed her we are watching her blood sugars. If they do not improve by tomorrow may need order to remove pump and dose with basal/bolus via sq. Current dose of steroid is 20 mg of Prednisone.  MD - please change order to CBG to before meals, bedtime, and 0200. Currently  it is ordered to check/dose every 4 hours and as patient is eating should be adjusted.   Thank you.  -- Will follow during hospitalization.--  Jonna Clark RN, MSN Diabetes Coordinator Inpatient Glycemic Control Team Team Pager: [phone] (8am-5pm)

## 2018-03-08 NOTE — Plan of Care (Signed)
  Problem: Education: Goal: Knowledge of General Education information will improve Description Including pain rating scale, medication(s)/side effects and non-pharmacologic comfort measures Outcome: Progressing   Problem: Elimination: Goal: Will not experience complications related to bowel motility Outcome: Progressing   Problem: Pain Managment: Goal: General experience of comfort will improve Outcome: Progressing   Problem: Safety: Goal: Ability to remain free from injury will improve Outcome: Progressing   Problem: Skin Integrity: Goal: Risk for impaired skin integrity will decrease Outcome: Progressing   

## 2018-03-08 NOTE — Plan of Care (Signed)

## 2018-03-08 NOTE — Telephone Encounter (Signed)
Message to Dr. Sharol Given to see on rounds.

## 2018-03-08 NOTE — Progress Notes (Signed)
PT Cancellation Note  Patient Details Name: Susan [name] MRN: [id number] DOB: Aug 12, 1950   Cancelled Treatment:    Reason Eval/Treat Not Completed: Patient declined, no reason specified Pt initially agreeable to session, however, then gave herself insulin and reported she needed to eat. Despite max encouragement to get to the chair to eat, pt continued to refuse. Will follow up as schedule allows.   Leighton Ruff, PT, DPT  Acute Rehabilitation Services  Pager: [phone] Office: [phone]  Rudean Hitt 03/08/2018, 12:17 PM

## 2018-03-08 NOTE — Progress Notes (Signed)
PROGRESS NOTE  Susan [name] IFO:[id number] DOB: 1950-06-09 DOA: 03/05/2018 PCP: Alycia Rossetti, MD  HPI/Recap of past 83 hours: 67 year old female with past medical history of diabetes mellitus type 2 on advanced insulin pump, diastolic CHF, stage III chronic kidney disease, hypothyroidism and CAD discharge from hospital last week following amputation of the first and second toes of her right foot who was readmitted on early morning of 10/13 after coming in with increased swelling and pain in her left hand, unable to flex her fingers.  No trauma or insect bite.  X-rays unrevealing.  Hand surgeon reviewed and at this time recommended treatment with antibiotics, but no need for surgical intervention.  Patient also found to have an elevated uric acid level at 9 and started on p.o. Prednisone.  Also started on IV antibiotics for presumed cellulitis.  Today, patient doing much better.  Still some hand pain and not able to fully close her fist, but certainly decreased tenderness and erythema.  She is feeling overall better.  She had some concerns about high blood sugars this morning.  She also tells me that she has not moved her bowels in over a week.  Assessment/Plan: Principal Problem:   Cellulitis of hand, left: Continue antibiotics.  Patient responding well and continues to improve.  She was requiring a walker following her foot surgery.  With her hand issues, she is not able to grip it as well, let alone move herself out of the bed.  Physical therapy to see Active Problems:   Diabetes mellitus type II, uncontrolled ([place]): Patient having well controlled.  CBGs are checked through her pump port.  According to the patient, her blood sugar was 337 this morning.  Likely elevated from her prednisone, although she received the prednisone about the same time as she checked her sugar.  Follow sugars through the day.  If need be, add Lantus.   Chronic diastolic CHF (congestive heart failure) ([place]): Strict  input and output.  Currently euvolemic   CKD stage 3 due to type 2 diabetes mellitus ([place]): Stable, creatinine on 10/13 at 1.67 with a GFR of 30   PAH (pulmonary artery hypertension) ([place]): Stable   PVD (peripheral vascular disease) (HCC) Elevated uric acid level: Patient not having acute gout flare, her middle finger on her left hand is quite tender and a little swollen.  Started on p.o. prednisone.  Avoid NSAIDs due to renal failure. Constipation: Patient has not moved her bowels in over a week.  Started with MiraLAX.  Stronger medication if this does not work.  In part from being on iron supplementation plus p.o. pain medication. Morbid obesity: Patient meets criteria BMI greater than 40 Status post recent foot amputation: Have notified her orthopedist, Dr. Sharol Given, who will come see her today  CAD: Continue aspirin and Plavix Depression: Continue Lexapro   Code Status: Full code  Family Communication: Lives alone  Disposition Plan: Anticipate discharge once patient moves bowels, blood sugar stable, able to close hand up enough   Consultants:  Orthopedics  Hand surgery  Procedures:  None  Antimicrobials:  IV vancomycin 10/13-present  DVT prophylaxis: With no procedure plan, change to Lovenox   Objective: Vitals:   03/08/18 0429 03/08/18 1032  BP: (!) 149/69 (!) 149/69  Pulse: 74 74  Resp:    Temp: 97.6 F (36.4 C)   SpO2: 95%     Intake/Output Summary (Last 24 hours) at 03/08/2018 1451 Last data filed at 03/08/2018 0838 Gross per 24 hour  Intake  240 ml  Output 1200 ml  Net -960 ml   Filed Weights   03/05/18 2142 03/07/18 0535  Weight: 124.7 kg 129 kg   Body mass index is 47.33 kg/m.  Exam:   General: Alert and oriented x3, no acute distress  HEENT: Normocephalic and atraumatic, mucous members are moist  Neck: Supple, no JVD  Cardiovascular: Regular rate and rhythm, S1-S2  Respiratory: Clear to auscultation bilaterally  Abdomen: Soft,  nontender, nondistended, positive bowel sounds  Musculoskeletal: Left foot wrapped.  Right foot status post 2 toe amputation, wrapped.  Left hand swollen with minimal erythema.  Left middle finger somewhat swollen and tender to touch at the PIP joint  Neuro: Chronic neuropathy  Skin: Left hand minimally erythematous  Psychiatry: Appropriate, no evidence of psychoses   Data Reviewed: CBC: Recent Labs  Lab 03/05/18 1911 03/06/18 0422 03/07/18 0737  WBC 5.6 4.1 4.1  NEUTROABS 4.0  --   --   HGB 10.1* 9.5* 9.5*  HCT 34.2* 32.2* 32.8*  MCV 79.4* 78.0* 77.9*  PLT 326 302 937   Basic Metabolic Panel: Recent Labs  Lab 03/05/18 1911 03/06/18 0422 03/07/18 0622  NA 138 140 139  K 3.7 3.5 4.1  CL 104 107 104  CO2 26 24 21*  GLUCOSE 154* 132* 96  BUN 45* 40* 33*  CREATININE 1.93* 1.60* 1.67*  CALCIUM 8.8* 8.5* 8.5*   GFR: Estimated Creatinine Clearance: 43.7 mL/min (A) (by C-G formula based on SCr of 1.67 mg/dL (H)). Liver Function Tests: No results for input(s): AST, ALT, ALKPHOS, BILITOT, PROT, ALBUMIN in the last 168 hours. No results for input(s): LIPASE, AMYLASE in the last 168 hours. No results for input(s): AMMONIA in the last 168 hours. Coagulation Profile: No results for input(s): INR, PROTIME in the last 168 hours. Cardiac Enzymes: No results for input(s): CKTOTAL, CKMB, CKMBINDEX, TROPONINI in the last 168 hours. BNP (last 3 results) No results for input(s): PROBNP in the last 8760 hours. HbA1C: No results for input(s): HGBA1C in the last 72 hours. CBG: Recent Labs  Lab 03/06/18 1239 03/06/18 1703 03/06/18 2111 03/07/18 0011 03/07/18 0406  GLUCAP 98 138* 135* 103* 91   Lipid Profile: No results for input(s): CHOL, HDL, LDLCALC, TRIG, CHOLHDL, LDLDIRECT in the last 72 hours. Thyroid Function Tests: No results for input(s): TSH, T4TOTAL, FREET4, T3FREE, THYROIDAB in the last 72 hours. Anemia Panel: No results for input(s): VITAMINB12, FOLATE,  FERRITIN, TIBC, IRON, RETICCTPCT in the last 72 hours. Urine analysis:    Component Value Date/Time   COLORURINE YELLOW 07/22/2017 2053   APPEARANCEUR CLOUDY (A) 07/22/2017 2053   LABSPEC 1.012 07/22/2017 2053   PHURINE 5.0 07/22/2017 2053   GLUCOSEU NEGATIVE 07/22/2017 2053   HGBUR SMALL (A) 07/22/2017 2053   BILIRUBINUR NEGATIVE 07/22/2017 2053   KETONESUR NEGATIVE 07/22/2017 2053   PROTEINUR 30 (A) 07/22/2017 2053   UROBILINOGEN 0.2 04/01/2014 1659   NITRITE NEGATIVE 07/22/2017 2053   LEUKOCYTESUR LARGE (A) 07/22/2017 2053   Sepsis Labs: @LABRCNTIP (procalcitonin:4,lacticidven:4)  )No results found for this or any previous visit (from the past 240 hour(s)).    Studies: No results found.  Scheduled Meds: . aspirin EC  81 mg Oral Daily  . carvedilol  12.5 mg Oral BID WC  . cholecalciferol  1,000 Units Oral Daily  . clopidogrel  75 mg Oral Q breakfast  . escitalopram  20 mg Oral Daily  . ferrous sulfate  325 mg Oral Q breakfast  . insulin pump   Subcutaneous Q4H  .  isosorbide dinitrate  10 mg Oral BID  . levothyroxine  50 mcg Oral QAC breakfast  . losartan  50 mg Oral Daily  . magnesium oxide  400 mg Oral Daily  . [START ON 03/10/2018] metolazone  5 mg Oral Q Wed  . polyethylene glycol  17 g Oral Daily  . predniSONE  20 mg Oral Q breakfast  . pregabalin  150 mg Oral BID  . rosuvastatin  20 mg Oral Daily  . senna-docusate  1 tablet Oral BID  . torsemide  80 mg Oral BID    Continuous Infusions: . cefTRIAXone (ROCEPHIN)  IV 1 g (03/07/18 2037)  . vancomycin 1,250 mg (03/08/18 0010)     LOS: 1 day     Annita Brod, MD [facility]  To reach me or the doctor on call, go to: [email or web address] Password [email or web address]  03/08/2018, 2:51 PM

## 2018-03-08 NOTE — Consult Note (Signed)
[name] CM Inpatient Consult   03/08/2018  Susan [name] 02/02/1951 [id number]   03/09/18 1426 pm:  Call received from C[facility] CSW [facility] regarding post hospital needs that the patient is having ongoing issues affording her medications and insulin coverage in the [facility].  Will have [name] Pharmacist to follow up.    Patient is currently active with [name] Care Management for chronic disease management services.  Patient has been engaged by a RN Community Care Coordinator and CSW.  Met with the patient at the bedside.  Our community based plan of care has focused on disease management and community resource support.  Patient will receive a post discharge transition of care call and will be evaluated for monthly home visits for assessments and disease process education.  Made Inpatient Case Manager aware that [name] Care Management following. Of note, [name] Care Management services does not replace or interfere with any services that are needed or arranged by inpatient case management or social work.  For additional questions or referrals please contact:  Charliene Inoue, RN BSN CCM [facility] Hospital Liaison  [phone] business mobile phone Toll free office [phone]      [facility] CM Inpatient Consult   03/08/2018  Susan [name] December 14, 1950 [id number]   03/09/18 1426 pm:  Call received from [place] regarding post hospital needs that the patient is having ongoing issues affording her medications and insulin coverage in the [facility].  Will have [facility] Pharmacist to follow up.    Patient is currently active with [facility] Management for chronic disease management services.  Patient has been engaged by a [employer] and CSW.  Met with the patient at the bedside.  Our community based plan of care has focused on disease management and community resource support.  Patient will receive a post discharge transition of care call and will be evaluated for monthly home visits for assessments and disease process education.  Made Inpatient Case Manager aware that [facility] Management following. Of note, [facility] Care Management services does not replace or interfere with any services that are needed or arranged by inpatient case management or social work.  For additional questions or referrals please contact:  Natividad Brood, RN BSN [facility] Liaison  [phone] business mobile phone Toll free office [phone]

## 2018-03-08 NOTE — Telephone Encounter (Signed)
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 Had advised to go to the ER with hand redness and swelling and she was admitted this weekend. Pt cx post op appt today and requesting that you see her while in the hospital.

## 2018-03-08 NOTE — Telephone Encounter (Signed)
Susan [name] at [facility] is wanting to let Dr. Sharol Given know that patient is 100% non compliant and has been walking on her foot and weight bearing , even though she was to be touch down only. [name] said patient called her from hospital and is scared because she has "broken every rule Dr. Sharol Given told her not to do" and is worried about the condition of her foot. [name] # [phone]

## 2018-03-09 DIAGNOSIS — N184 Chronic kidney disease, stage 4 (severe): Secondary | ICD-10-CM

## 2018-03-09 DIAGNOSIS — I739 Peripheral vascular disease, unspecified: Secondary | ICD-10-CM

## 2018-03-09 DIAGNOSIS — I2721 Secondary pulmonary arterial hypertension: Secondary | ICD-10-CM

## 2018-03-09 DIAGNOSIS — M109 Gout, unspecified: Secondary | ICD-10-CM

## 2018-03-09 DIAGNOSIS — M6530 Trigger finger, unspecified finger: Secondary | ICD-10-CM

## 2018-03-09 DIAGNOSIS — I5032 Chronic diastolic (congestive) heart failure: Secondary | ICD-10-CM

## 2018-03-09 DIAGNOSIS — E1122 Type 2 diabetes mellitus with diabetic chronic kidney disease: Secondary | ICD-10-CM

## 2018-03-09 LAB — GLUCOSE, CAPILLARY
Glucose-Capillary: 285 mg/dL — ABNORMAL HIGH (ref 70–99)
Glucose-Capillary: 264 mg/dL — ABNORMAL HIGH (ref 70–99)
Glucose-Capillary: 287 mg/dL — ABNORMAL HIGH (ref 70–99)
Glucose-Capillary: 401 mg/dL — ABNORMAL HIGH (ref 70–99)

## 2018-03-09 MED ORDER — BISACODYL 10 MG RE SUPP: 10.0000 mg | Freq: Every day | RECTAL | Status: DC | PRN

## 2018-03-09 MED ORDER — INSULIN NPH (HUMAN) (ISOPHANE) 100 UNIT/ML ~~LOC~~ SUSP
10.0000 [IU] | Freq: Every day | SUBCUTANEOUS | Status: DC
  Filled 2018-03-09: qty 10

## 2018-03-09 MED ORDER — CLINDAMYCIN HCL 300 MG PO CAPS
300.0000 mg | ORAL_CAPSULE | Freq: Three times a day (TID) | ORAL | Status: DC
  Administered 2018-03-09 – 2018-03-11 (×7): 300 mg via ORAL
  Filled 2018-03-09 (×8): qty 1

## 2018-03-09 NOTE — Progress Notes (Signed)
Inpatient Diabetes Program Recommendations  AACE/ADA: New Consensus Statement on Inpatient Glycemic Control (2015)  Target Ranges:  Prepandial:   less than 140 mg/dL      Peak postprandial:   less than 180 mg/dL (1-2 hours)      Critically ill patients:  140 - 180 mg/dL   Lab Results  Component Value Date   GLUCAP 401 (H) 03/09/2018   HGBA1C 9.3 (H) 09/25/2017    Review of Glycemic Control  Note CBG 401 mg/dL before dinner. Pt bolused 15 units per insulin pump. Called RN and it was reported pt drank prune juice and apple juice 1 hour before CBG was checked.  10/14: Bolused 57 units of Novolog. 10/15: Bolused 49 units of Novolog thus far.  On Prednisone 20 mg QAM.  Inpatient Diabetes Program Recommendations:     Will need to come off insulin pump and change to basal-bolus insulin if next CBG > 250 mg/dL.  Pt states she takes NPH 10 units QAM if blood sugars are elevated at home.  Would check blood sugars Q4H x 12H now.  If pump is discontinued,   Lantus 70 units QAM, Novolog 0-9 units tidwc and hs + 10 units tidwc for meal coverage insulin.  Will f/u in am.   Thank you. Lorenda Peck, RD, LDN, CDE Inpatient Diabetes Coordinator [phone]

## 2018-03-09 NOTE — Care Management Important Message (Signed)
Important Message  Patient Details  Name: Susan [name] MRN: [id number] Date of Birth: Jan 20, 1951   Medicare Important Message Given:  Yes    Kanton Kamel [name] 03/09/2018, 3:44 PM

## 2018-03-09 NOTE — Progress Notes (Signed)
Inpatient Diabetes Program Recommendations  AACE/ADA: New Consensus Statement on Inpatient Glycemic Control (2015)  Target Ranges:  Prepandial:   less than 140 mg/dL      Peak postprandial:   less than 180 mg/dL (1-2 hours)      Critically ill patients:  140 - 180 mg/dL   Lab Results  Component Value Date   GLUCAP 287 (H) 03/09/2018   HGBA1C 9.3 (H) 09/25/2017    Review of Glycemic Control  Blood sugars in past 24H are 264 - 369 mg/dL.  Inpatient Diabetes Program Recommendations:     Add NPH 10 units QAM.  Continue to follow.  Thank you. Lorenda Peck, RD, LDN, CDE Inpatient Diabetes Coordinator [phone]

## 2018-03-09 NOTE — Progress Notes (Signed)
PROGRESS NOTE  Susan [name] GQQ:[id number] DOB: [birth year] DOA: 03/05/2018 PCP: Alycia Rossetti, MD  Brief Narrative  67 year old female with past medical history of diabetes mellitus type 2 on  insulin pump, diastolic CHF, stage III chronic kidney disease, hypothyroidism and CAD was recently discharged from the hospital following amputation of the first and second toes of her right foot was readmitted on  10/13 after with increased swelling and pain in her left hand, unable to flex her fingers.  No trauma or insect bite.  X-ray were unrevealing.  Hand surgeon reviewed and at this time did not recommend surgical intervention and recommended treatment with antibiotics, but no need for surgical intervention.  Patient also found to have an elevated uric acid level at 9 and started on p.o. Prednisone.  Also started on IV antibiotics for presumed cellulitis.  Assessment/Plan:  Left long finger redness pain and swelling s significantly improved currently on IV antibiotic and medication for gout.  Orthopedics believes that it was a trigger finger with gout.  Consider medications for gout on discharge.  Discontinue IV antibiotics.  Put patient on oral clindamycin to complete course.  Diabetes mellitus type II.  Continue on sliding scale insulin Accu-Cheks.  Is currently on prednisone we will continue to monitor blood glucose levels.  Diastolic congestive heart failure.  Patient is currently euvolemic.  Continue to monitor closely.  CKD stage III.  Stable.  Monitor BMP.  Pulmonary hypertension-peripheral vascular disease.  Stable.  Possible acute gouty flare.  On colchicine and allopurinol, orthopedics on board.  Constipation.  Does not want to take suppository.  Continue on MiraLAX and stool softener.  Status post recent right foot amputation.  Well-healing first and second toe amputation of the right foot.  Seen by Dr. Sharol Given orthopedics.  Patient was advised to apply dry dressing and wear  knee-high medical compression stockings.  Patient is advised touchdown weightbearing on the right.  Will need to follow-up with orthopedics as outpatient in 1 week to harvest the sutures.  Patient was advised to work with physical therapy.    Code Status: Full code  Family Communication: Lives alone  Disposition Plan: Patient is a still unable to close her fist limiting her ability to use the walker.  She is much down weightbearing on the right leg.  We will continue to monitor.  Likely able to be discharged home in 1 to 2 days.   Consultants:  Orthopedics  Hand surgery  Procedures:  None  Antimicrobials:  IV vancomycin 10/13-present  DVT prophylaxis: Lovenox  Subjective:  Complains of inability to close her fist.  Still has constipation.  Objective: Vitals:   03/09/18 0255 03/09/18 0953  BP: (!) 152/76 (!) 151/70  Pulse: 73 74  Resp: 18 18  Temp: (!) 97.4 F (36.3 C) 97.9 F (36.6 C)  SpO2: 93% 98%    Intake/Output Summary (Last 24 hours) at 03/09/2018 1235 Last data filed at 03/09/2018 0900 Gross per 24 hour  Intake 600 ml  Output 3050 ml  Net -2450 ml   Filed Weights   03/05/18 2142 03/07/18 0535  Weight: 124.7 kg 129 kg   Body mass index is 47.33 kg/m.  Physical Examination: General exam: Appears calm and comfortable ,Not in distress HEENT:PERRL,Oral mucosa moist Respiratory system: Bilateral equal air entry, normal vesicular breath sounds, no wheezes or crackles  Cardiovascular system: S1 & S2 heard, RRR.  Gastrointestinal system: Abdomen is nondistended, soft and nontender. No organomegaly or masses felt. Normal bowel sounds heard. Central  nervous system: Alert and oriented. No focal neurological deficits. Extremities: Left foot wrapped on compression stockings.  Left middle finger without significant erythema tenderness but restricted flexion.   Skin: No rashes, lesions or ulcers,no icterus ,no pallor MSK: Normal muscle bulk,tone ,power  Data  Reviewed: CBC: Recent Labs  Lab 03/05/18 1911 03/06/18 0422 03/07/18 0737  WBC 5.6 4.1 4.1  NEUTROABS 4.0  --   --   HGB 10.1* 9.5* 9.5*  HCT 34.2* 32.2* 32.8*  MCV 79.4* 78.0* 77.9*  PLT 326 302 734   Basic Metabolic Panel: Recent Labs  Lab 03/05/18 1911 03/06/18 0422 03/07/18 0622  NA 138 140 139  K 3.7 3.5 4.1  CL 104 107 104  CO2 26 24 21*  GLUCOSE 154* 132* 96  BUN 45* 40* 33*  CREATININE 1.93* 1.60* 1.67*  CALCIUM 8.8* 8.5* 8.5*   GFR: Estimated Creatinine Clearance: 43.7 mL/min (A) (by C-G formula based on SCr of 1.67 mg/dL (H)). Liver Function Tests: No results for input(s): AST, ALT, ALKPHOS, BILITOT, PROT, ALBUMIN in the last 168 hours. No results for input(s): LIPASE, AMYLASE in the last 168 hours. No results for input(s): AMMONIA in the last 168 hours. Coagulation Profile: No results for input(s): INR, PROTIME in the last 168 hours. Cardiac Enzymes: No results for input(s): CKTOTAL, CKMB, CKMBINDEX, TROPONINI in the last 168 hours. BNP (last 3 results) No results for input(s): PROBNP in the last 8760 hours. HbA1C: No results for input(s): HGBA1C in the last 72 hours. CBG: Recent Labs  Lab 03/08/18 2034 03/08/18 2336 03/09/18 0428 03/09/18 0735 03/09/18 1207  GLUCAP 388* 369* 285* 264* 287*   Lipid Profile: No results for input(s): CHOL, HDL, LDLCALC, TRIG, CHOLHDL, LDLDIRECT in the last 72 hours. Thyroid Function Tests: No results for input(s): TSH, T4TOTAL, FREET4, T3FREE, THYROIDAB in the last 72 hours. Anemia Panel: No results for input(s): VITAMINB12, FOLATE, FERRITIN, TIBC, IRON, RETICCTPCT in the last 72 hours. Urine analysis:    Component Value Date/Time   COLORURINE YELLOW 07/22/2017 2053   APPEARANCEUR CLOUDY (A) 07/22/2017 2053   LABSPEC 1.012 07/22/2017 2053   PHURINE 5.0 07/22/2017 2053   GLUCOSEU NEGATIVE 07/22/2017 2053   HGBUR SMALL (A) 07/22/2017 2053   BILIRUBINUR NEGATIVE 07/22/2017 2053   KETONESUR NEGATIVE 07/22/2017  2053   PROTEINUR 30 (A) 07/22/2017 2053   UROBILINOGEN 0.2 04/01/2014 1659   NITRITE NEGATIVE 07/22/2017 2053   LEUKOCYTESUR LARGE (A) 07/22/2017 2053   Sepsis Labs: @LABRCNTIP (procalcitonin:4,lacticidven:4)  )No results found for this or any previous visit (from the past 240 hour(s)).    Studies: No results found.  Scheduled Meds: . allopurinol  100 mg Oral BID  . aspirin EC  81 mg Oral Daily  . carvedilol  12.5 mg Oral BID WC  . cholecalciferol  1,000 Units Oral Daily  . clopidogrel  75 mg Oral Q breakfast  . colchicine  0.6 mg Oral Daily  . escitalopram  20 mg Oral Daily  . ferrous sulfate  325 mg Oral Q breakfast  . insulin pump   Subcutaneous Q4H  . isosorbide dinitrate  10 mg Oral BID  . levothyroxine  50 mcg Oral QAC breakfast  . losartan  50 mg Oral Daily  . magnesium oxide  400 mg Oral Daily  . [START ON 03/10/2018] metolazone  5 mg Oral Q Wed  . polyethylene glycol  17 g Oral Daily  . predniSONE  20 mg Oral Q breakfast  . pregabalin  150 mg Oral BID  . rosuvastatin  20 mg Oral Daily  . senna-docusate  1 tablet Oral BID  . torsemide  80 mg Oral BID    Continuous Infusions: . cefTRIAXone (ROCEPHIN)  IV 1 g (03/08/18 2025)  . vancomycin 1,250 mg (03/08/18 2225)     LOS: 2 days     Flora Lipps, MD [facility]  To reach me or the doctor on call, go to: [email or web address] Password [facility]  03/09/2018, 12:35 PM

## 2018-03-09 NOTE — Care Management Note (Signed)
Case Management Note  Patient Details  Name: Susan [name] MRN: [id number] Date of Birth: September 04, 1950  Subjective/Objective:   67 yr old female admitted with left long finger redness and swelling of hand. IV antibiotic treatment initiated on admission.   Patient was recently discharged from hospital following amputation of first and second toes of her right foot.                 Action/Plan:  Patient has refused SNF placement. Lives home with husband and son. [facility] is active with [facility] and they will resume services at discharge.   Expected Discharge Date:  03/09/18             Expected Discharge Plan:  [place]  In-House Referral:  NA  Discharge planning Services  CM Consult  Post Acute Care Choice:  Home Health, Resumption of Svcs/PTA Provider Choice offered to:  Patient  DME Arranged:    DME Agency:  NA  HH Arranged:  PT, OT, Nurse's Aide [place] Agency:     Status of Service:     If discussed at [place] of Stay Meetings, dates discussed:    Additional Comments:  Ninfa Meeker, RN 03/09/2018, 2:56 PM

## 2018-03-09 NOTE — Plan of Care (Signed)

## 2018-03-09 NOTE — Evaluation (Signed)
Physical Therapy Evaluation Patient Details Name: Susan [name] MRN: [id number] DOB: 06/21/1950 Today's Date: 03/09/2018   History of Present Illness  Susan [name] is an 67 y.o. 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 Sharol Given (TWB R foot with postop shoe), started noticing increasing swelling and pain in the left hand unable to flex the fingers.  Since patient's symptoms progressed patient came to the ER.  Clinical Impression   Pt admitted with above diagnosis. Pt currently with functional limitations due to the deficits listed below (see PT Problem List). Was recovering from recent R first and second ray foot amputation (TWB RLE), when gout attack L hand significantly affected functional mobility; Presents with decr functional mobility, difficulty keeping weight off of RLE with any type of transfer involving any form of standign; at this point, she will need to be at wheelchair transfer functional level; She would benefit from further post-acute rehabilitation to maximize independence and safety with mobility, however she becomes emotional about wanting to dc home; Placed OT order for ADLs per protocol;  Pt will benefit from skilled PT to increase their independence and safety with mobility to allow discharge to the venue listed below.       Follow Up Recommendations SNF;Supervision/Assistance - 24 hour;Other (comment)(Will likely refuse, in which case, I rec maximizing HH services: HHPT/OT/Aide)    Equipment Recommendations  Wheelchair (measurements PT);Wheelchair cushion (measurements PT);Other (comment)(Drop-arm BBSC) Sliding board, consider [facility] home (or non-emergent ambulance?)   Recommendations for Other Services OT consult(ordered per protocol)     Precautions / Restrictions  Precautions Precautions: Fall Precaution Comments: Difficulty maintaining TWB R foot with any type of standing/stand pivot Required Braces or Orthoses: Other Brace/Splint Other Brace/Splint: post op shoe R foot Restrictions RLE Weight Bearing: Touchdown weight bearing      Mobility  Bed Mobility Overal bed mobility: Needs Assistance Bed Mobility: Supine to Sit     Supine to sit: Min guard(HOB flat)     General bed mobility comments: Incr time and a bit of a struggle to get up; held off of physical assist as she so much wants to go home, and must be modified independent  Transfers Overall transfer level: Needs assistance Equipment used: Rolling walker (2 wheeled) Transfers: Lateral/Scoot Customer service manager;Sit to/from Stand Sit to Stand: Max assist;+2 safety/equipment(patient unable to maintain weight bearing during transition into standing.  )   Squat pivot transfers: Min assist    Lateral/Scoot Transfers: Min assist General transfer comment: Performed lateral scoot bed to wheelchair, squat pivot wheelchair to recliner (over armrest), sit to stand from recliner; Noting too much weight on RLE with the sit to stand and squat/stand pivot  Ambulation/Gait                Stairs            Wheelchair Mobility    Modified Rankin (Stroke Patients Only)       Balance     Sitting balance-Leahy Scale: Fair       Standing balance-Leahy Scale: Poor                               Pertinent Vitals/Pain Pain Assessment: Faces Faces Pain Scale: Hurts little more Pain Location: L fingers; REports pain is  much improved Pain Descriptors / Indicators: Sore Pain Intervention(s): Monitored during session    Home Living Family/patient expects to be discharged to:: Private residence Living Arrangements: Children(She did not mention she has spouse support this eval) Available Help at Discharge: Available PRN/intermittently(husband works) Type  of Home: House Home Access: Ramped entrance     Home Layout: One level Home Equipment: Grab bars - tub/shower;Walker - 2 wheels;Walker - 4 wheels;Cane - single point Additional Comments: At one point, she indicated she has a wheelchair; will need to verify this    Prior Function Level of Independence: Needs assistance   Gait / Transfers Assistance Needed: was able to perform self care prior to recent 10/3 admission; tells me she was working on transfers at [facility] for rehab  ADL's / Homemaking Assistance Needed: was trying to do cooking and cleaning prior to recent 10/3 admission  Comments: Pt typically ambulates with a RW and can perform self care.     Hand Dominance   Dominant Hand: Right    Extremity/Trunk Assessment   Upper Extremity Assessment Upper Extremity Assessment: Defer to OT evaluation(Able to extend fingers actively L hand; subjective report of grip weakness)    Lower Extremity Assessment Lower Extremity Assessment: RLE deficits/detail;LLE deficits/detail RLE Deficits / Details: AROM limited knee flexion in supine about 90 in sitting, strength 3/5 hip flexion, 4-/5 knee extension RLE Sensation: history of peripheral neuropathy;decreased light touch LLE Deficits / Details: AROM limited knee flexion in supine about 90 in sitting, strength 3-/5 hip flexion, 4-/5 knee extension LLE Sensation: history of peripheral neuropathy;decreased light touch       Communication   Communication: No difficulties  Cognition Arousal/Alertness: Awake/alert Behavior During Therapy: WFL for tasks assessed/performed;Anxious Overall Cognitive Status: Within Functional Limits for tasks assessed                                 General Comments: Anxious and a bit emotional when talking about how much she wants to get home      General Comments General comments (skin integrity, edema, etc.): Lengthy discussion re: keeping weight off of R foot with transfers lending to  decision for her to be more wheelchair functioanlly based; lenghty discussion also re; recommendations for dc -- PT recommending SNF for continued rehab, however she very much wants to go home    Exercises     Assessment/Plan    PT Assessment Patient needs continued PT services  PT Problem List Decreased balance;Pain;Decreased activity tolerance;Decreased strength;Decreased mobility;Decreased safety awareness;Decreased knowledge of precautions;Decreased knowledge of use of DME       PT Treatment Interventions DME instruction;Therapeutic activities;Patient/family education;Therapeutic exercise;Gait training;Functional mobility training;Balance training;Wheelchair mobility training    PT Goals (Current goals can be found in the Care Plan section)  Acute Rehab PT Goals Patient Stated Goal: to go home PT Goal Formulation: With patient Time For Goal Achievement: 03/23/18 Potential to Achieve Goals: Fair Additional Goals Additional Goal #1: Pt will independently manage parts and propulsion of wheelchair in all directions for greater tahn 1000 feet    Frequency Min 3X/week   Barriers to discharge Decreased caregiver support Must be modified independent and keep weight off of RLE    Co-evaluation               AM-PAC PT "6 Clicks" Daily Activity  Outcome Measure Difficulty turning over in bed (including adjusting bedclothes, sheets and blankets)?: A Little Difficulty moving from lying  on back to sitting on the side of the bed? : A Little Difficulty sitting down on and standing up from a chair with arms (e.g., wheelchair, bedside commode, etc,.)?: Unable Help needed moving to and from a bed to chair (including a wheelchair)?: A Lot Help needed walking in hospital room?: Total Help needed climbing 3-5 steps with a railing? : Total 6 Click Score: 11    End of Session Equipment Utilized During Treatment: Gait belt Activity Tolerance: Patient tolerated treatment well Patient  left: in chair;with call bell/phone within reach Nurse Communication: Mobility status;Other (comment)(dc plans) PT Visit Diagnosis: Other abnormalities of gait and mobility (R26.89);History of falling (Z91.81)    Time: 1694-5038 PT Time Calculation (min) (ACUTE ONLY): 53 min   Charges:   PT Evaluation $PT Eval Moderate Complexity: 1 Mod PT Treatments $Therapeutic Activity: 38-52 mins        Roney Marion, PT  Acute Rehabilitation Services Pager [phone] Office [phone]   [place]letta [place] 03/09/2018, 2:00 PM

## 2018-03-10 ENCOUNTER — Inpatient Hospital Stay (HOSPITAL_COMMUNITY): Payer: PPO

## 2018-03-10 DIAGNOSIS — R0902 Hypoxemia: Secondary | ICD-10-CM

## 2018-03-10 DIAGNOSIS — R0603 Acute respiratory distress: Secondary | ICD-10-CM

## 2018-03-10 LAB — BLOOD GAS, ARTERIAL
Acid-Base Excess: 5.9 mmol/L — ABNORMAL HIGH (ref 0.0–2.0)
Bicarbonate: 30.3 mmol/L — ABNORMAL HIGH (ref 20.0–28.0)
Drawn by: 511851
O2 Content: 2 L/min
O2 Saturation: 94.3 %
Patient temperature: 98.6
pCO2 arterial: 48 mmHg (ref 32.0–48.0)
pH, Arterial: 7.417 (ref 7.350–7.450)
pO2, Arterial: 75.7 mmHg — ABNORMAL LOW (ref 83.0–108.0)

## 2018-03-10 LAB — GLUCOSE, CAPILLARY
Glucose-Capillary: 127 mg/dL — ABNORMAL HIGH (ref 70–99)
Glucose-Capillary: 135 mg/dL — ABNORMAL HIGH (ref 70–99)
Glucose-Capillary: 170 mg/dL — ABNORMAL HIGH (ref 70–99)
Glucose-Capillary: 207 mg/dL — ABNORMAL HIGH (ref 70–99)
Glucose-Capillary: 228 mg/dL — ABNORMAL HIGH (ref 70–99)
Glucose-Capillary: 347 mg/dL — ABNORMAL HIGH (ref 70–99)
Glucose-Capillary: 351 mg/dL — ABNORMAL HIGH (ref 70–99)
Glucose-Capillary: 373 mg/dL — ABNORMAL HIGH (ref 70–99)
Glucose-Capillary: 390 mg/dL — ABNORMAL HIGH (ref 70–99)

## 2018-03-10 LAB — BASIC METABOLIC PANEL
Anion gap: 10 (ref 5–15)
BUN: 58 mg/dL — ABNORMAL HIGH (ref 8–23)
CO2: 32 mmol/L (ref 22–32)
Calcium: 9.2 mg/dL (ref 8.9–10.3)
Chloride: 93 mmol/L — ABNORMAL LOW (ref 98–111)
Creatinine, Ser: 1.76 mg/dL — ABNORMAL HIGH (ref 0.44–1.00)
GFR calc Af Amer: 33 mL/min — ABNORMAL LOW (ref >60)
GFR calc non Af Amer: 29 mL/min — ABNORMAL LOW (ref >60)
Glucose, Bld: 291 mg/dL — ABNORMAL HIGH (ref 70–99)
Potassium: 4.1 mmol/L (ref 3.5–5.1)
Sodium: 135 mmol/L (ref 135–145)

## 2018-03-10 LAB — CBC
HCT: 34.7 % — ABNORMAL LOW (ref 36.0–46.0)
Hemoglobin: 10.5 g/dL — ABNORMAL LOW (ref 12.0–15.0)
MCH: 22.6 pg — ABNORMAL LOW (ref 26.0–34.0)
MCHC: 30.3 g/dL (ref 30.0–36.0)
MCV: 74.8 fL — ABNORMAL LOW (ref 80.0–100.0)
Platelets: 363 10*3/uL (ref 150–400)
RBC: 4.64 MIL/uL (ref 3.87–5.11)
RDW: 15.5 % (ref 11.5–15.5)
WBC: 7.2 10*3/uL (ref 4.0–10.5)
nRBC: 0 % (ref 0.0–0.2)

## 2018-03-10 MED ORDER — CLINDAMYCIN HCL 300 MG PO CAPS: 300.0000 mg | ORAL_CAPSULE | Freq: Three times a day (TID) | ORAL | 0 refills | Status: AC

## 2018-03-10 MED ORDER — SODIUM CHLORIDE 0.9 % IV BOLUS
1000.0000 mL | Freq: Once | INTRAVENOUS | Status: AC
  Administered 2018-03-10: 1000 mL via INTRAVENOUS

## 2018-03-10 MED ORDER — TECHNETIUM TO 99M ALBUMIN AGGREGATED
4.0000 | Freq: Once | INTRAVENOUS | Status: AC | PRN
  Administered 2018-03-10: 4 via INTRAVENOUS

## 2018-03-10 MED ORDER — TECHNETIUM TC 99M DIETHYLENETRIAME-PENTAACETIC ACID
30.0000 | Freq: Once | INTRAVENOUS | Status: AC | PRN
  Administered 2018-03-10: 30 via RESPIRATORY_TRACT

## 2018-03-10 MED ORDER — ALLOPURINOL 100 MG PO TABS: 100.0000 mg | ORAL_TABLET | Freq: Two times a day (BID) | ORAL | 0 refills | Status: DC

## 2018-03-10 MED ORDER — COLCHICINE 0.6 MG PO TABS: 0.6000 mg | ORAL_TABLET | Freq: Every day | ORAL | 0 refills | Status: DC

## 2018-03-10 MED ORDER — PREDNISONE 20 MG PO TABS: 20.0000 mg | ORAL_TABLET | Freq: Every day | ORAL | 0 refills | Status: DC

## 2018-03-10 NOTE — Progress Notes (Signed)
OT Cancellation Note  Patient Details Name: [name] MRN: [id number] DOB: 1950/07/25   Cancelled Treatment:    Reason Eval/Treat Not Completed: Medical issues which prohibited therapy. Notified by RN staff that Pt had rapid response team called, BP 60's/40's. Will hold therapy and re-evaluate when medically appropriate.   [place] 03/10/2018, 2:48 PM  Hulda Humphrey OTR/L Acute Rehabilitation Services Pager: [phone] Office: [phone]

## 2018-03-10 NOTE — Progress Notes (Signed)
Attempted to call spouse on record to notify about change in pt condition. No answer received.

## 2018-03-10 NOTE — Discharge Summary (Addendum)
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  No trauma or insect bite.  X-ray were unrevealing.  Hand surgeon reviewed and at this time did not recommend surgical intervention and recommended treatment with antibiotics, but no need for surgical intervention.  Patient also found to have an elevated uric acid level at 9 and started on p.o. Prednisone.  Also started on IV antibiotics for presumed cellulitis.  Following conditions were addressed during hospitalization.    Left long finger redness, pain and swelling s significantly improved currently on IV antibiotic and medication for gout.  Orthopedics believes that it was a trigger finger with gout.  Consider medications for gout on discharge.    Will continue oral clindamycin to complete course.  Status post recent right foot amputation.  Well-healing first and second toe amputation of the right foot.  Seen by Dr. Sharol Given, orthopedics.  Patient was advised to apply dry dressing and wear knee-high medical compression stockings.  Patient was advised touchdown weightbearing on the right and compliance on it..    Patient will need to follow-up with orthopedics as outpatient in 1 week to harvest the sutures.    Diabetes mellitus type II.    Patient was continued on sliding scale  insulin during hospitalization.  Diastolic congestive heart failure.    Patient remained  euvolemic compensated.  CKD stage III.    Renal function remained stable.  Pulmonary hypertension-peripheral vascular disease.    Remained stable  Possible acute gouty flare.    Received colchicine and allopurinol,  was seen by orthopedics.  Will be discharged on allopurinol and colchicine.  Disposition patient has been seen by physical therapy and occupational therapy.  Physical therapy recommended skilled nursing facility placement for rehab, but patient has persistently denied. She has acknowledged the difficulties that she may have at home.  She will be considered for home health services on discharge.   Discharge Diagnoses:  Principal Problem:   Cellulitis of hand, left Active Problems:   Diabetes mellitus type II, uncontrolled (HCC)   Chronic diastolic CHF (congestive heart failure) (HCC)   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 mellitus (HCC)   PAH (pulmonary artery hypertension) (HCC)   PVD (peripheral vascular disease) ([place])   Cellulitis   Discharge Instructions  Discharge Instructions    Diet Carb Modified   Complete by:  As directed    Discharge instructions   Complete by:  As directed    Follow up with orthopedics Dr Sharol Given as scheduled to harvest the sutures. Apply dry dressing to the right foot, wear knee-high medical compression stockings around-the-clock. Please maintain touchdown weightbearing on the right.   Increase activity slowly   Complete by:  As directed      Allergies as of 03/10/2018      Reactions   Codeine Nausea And Vomiting      Medication List  TAKE these medications   albuterol 108 (90 Base) MCG/ACT inhaler Commonly known as:  PROVENTIL HFA;VENTOLIN HFA Inhale 2 puffs into the lungs every 6 (six) hours as needed for wheezing or shortness of breath. Reported on 10/30/2015   allopurinol 100 MG tablet Commonly known as:  ZYLOPRIM Take 1 tablet (100 mg  total) by mouth 2 (two) times daily.   aspirin EC 81 MG tablet Take 81 mg by mouth daily.   carvedilol 12.5 MG tablet Commonly known as:  COREG TAKE 1 TABLET BY MOUTH TWICE A DAY WITH A MEAL   cholecalciferol 1000 units tablet Commonly known as:  VITAMIN D Take 1,000 Units by mouth daily.   clindamycin 300 MG capsule Commonly known as:  CLEOCIN Take 1 capsule (300 mg total) by mouth every 8 (eight) hours for 2 days.   clopidogrel 75 MG tablet Commonly known as:  PLAVIX TAKE 1 TABLET BY MOUTH DAILY WITH BREAKFAST. What changed:  when to take this   colchicine 0.6 MG tablet Take 1 tablet (0.6 mg total) by mouth daily.   escitalopram 20 MG tablet Commonly known as:  LEXAPRO Take 1 tablet (20 mg total) by mouth daily.   ferrous sulfate 325 (65 FE) MG tablet Take 325 mg by mouth daily with breakfast. Reported on 08/21/2015   HYDROcodone-acetaminophen 5-325 MG tablet Commonly known as:  NORCO/VICODIN Take 1 tablet by mouth every 4 (four) hours as needed for moderate pain.   insulin pump Soln Inject into the skin.   insulin regular 100 units/mL injection Commonly known as:  NOVOLIN R,HUMULIN R 3 (three) times daily before meals.   isosorbide dinitrate 10 MG tablet Commonly known as:  ISORDIL TAKE 1 TABLET BY MOUTH TWICE A DAY   levothyroxine 50 MCG tablet Commonly known as:  SYNTHROID, LEVOTHROID Take 1 tablet (50 mcg total) by mouth daily before breakfast.   losartan 50 MG tablet Commonly known as:  COZAAR Take 1 tablet (50 mg total) by mouth daily.   Magnesium Oxide 400 (240 Mg) MG Tabs TAKE 1 TABLET BY MOUTH EVERY DAY What changed:    how much to take  how to take this  when to take this   metolazone 5 MG tablet Commonly known as:  ZAROXOLYN Take 1 tablet (5 mg total) by mouth once a week.   metolazone 2.5 MG tablet Commonly known as:  ZAROXOLYN TAKE 1 TABLET BY MOUTH ONCE A WEEK.   nitroGLYCERIN 0.4 MG SL tablet Commonly known as:   NITROSTAT PLACE 1 TABLET (0.4 MG TOTAL) UNDER THE TONGUE EVERY 5 (FIVE) MINUTES AS NEEDED FOR CHEST PAIN.   ondansetron 4 MG tablet Commonly known as:  ZOFRAN Take 1 tablet (4 mg total) by mouth every 8 (eight) hours as needed for nausea or vomiting.   ONETOUCH VERIO test strip Generic drug:  glucose blood   predniSONE 20 MG tablet Commonly known as:  DELTASONE Take 1 tablet (20 mg total) by mouth daily with breakfast.   pregabalin 150 MG capsule Commonly known as:  LYRICA Take 1 capsule (150 mg total) by mouth 2 (two) times daily.   promethazine 12.5 MG tablet Commonly known as:  PHENERGAN Take 1 tablet (12.5 mg total) by mouth every 6 (six) hours as needed for nausea or vomiting.   rosuvastatin 20 MG tablet Commonly known as:  CRESTOR TAKE 1 TABLET BY MOUTH EVERY DAY   torsemide 20 MG tablet Commonly known as:  DEMADEX Take 80 mg by mouth 2 (two) times daily.  vitamin E 1000 UNIT capsule Generic drug:  vitamin E Take 1,000 Units by mouth daily.      Follow-up Information    Newt Minion, MD Follow up in 1 week(s).   Specialty:  Orthopedic Surgery Contact information: [place] [address] [phone]        [facility] Follow up.   Specialty:  Home Health Services Why:  A representative from [place] will contact you to resume your [place]. Contact information: [place] [address] [phone]          Allergies  Allergen Reactions  . Codeine Nausea And Vomiting    Consultations:  orthopedics   Procedures/Studies:  Nm Pulmonary Perf And Vent  Result Date: 02/14/2018 CLINICAL DATA:  67 year old female with acute shortness of breath and chest pain. EXAM: NUCLEAR MEDICINE VENTILATION - PERFUSION LUNG SCAN TECHNIQUE: Ventilation images were obtained in multiple projections using inhaled aerosol Tc-34m DTPA. Perfusion images were obtained in multiple  projections after intravenous injection of Tc-28m-MAA. RADIOPHARMACEUTICALS:  31.0 mCi of Tc-7m DTPA aerosol inhalation and 4.3 mCi Tc32m-MAA IV COMPARISON:  02/13/2018 chest radiograph FINDINGS: Ventilation: No significant ventilation abnormalities noted. Perfusion: No wedge shaped peripheral perfusion defects or ventilation/perfusion mismatches to suggest acute pulmonary embolism. IMPRESSION: 1. Normal perfusion.  No evidence of pulmonary emboli. Electronically Signed   By: Margarette Canada M.D.   On: 02/14/2018 11:32   Dg Chest Portable 1 View  Result Date: 02/13/2018 CLINICAL DATA:  Encounter for chest pain and SOB that began earlier today. Pain is on the left side, pt states normally it is intermittent but has been constant today. EXAM: PORTABLE CHEST 1 VIEW COMPARISON:  07/16/2017 FINDINGS: Cardiac silhouette is normal in size. No mediastinal or hilar masses. There are prominent bronchovascular markings most evident in the lower lungs. No convincing pneumonia or pulmonary edema. No pleural effusion or pneumothorax. Skeletal structures are grossly intact. IMPRESSION: No acute cardiopulmonary disease. Electronically Signed   By: Lajean Manes M.D.   On: 02/13/2018 14:09   Dg Hand Complete Left  Result Date: 03/05/2018 CLINICAL DATA:  Initial evaluation for acute left hand pain, erythema, edema. EXAM: LEFT HAND - COMPLETE 3+ VIEW COMPARISON:  None. FINDINGS: No acute fracture dislocation. Mild osteoarthritic changes present about the hand, most notable at the first IP joint. Osseous mineralization normal. Mild diffuse soft tissue swelling about the hand, most notable at the ulnar aspect. No abnormal soft tissue emphysema. No radiopaque foreign body. No osseous erosive changes. IMPRESSION: 1. Mild diffuse soft tissue swelling about the hand. 2. No other acute osseous abnormality. Electronically Signed   By: Jeannine Boga M.D.   On: 03/05/2018 19:35   Dg Foot Complete Right  Result Date:  02/13/2018 CLINICAL DATA:  Ulcer at great toe EXAM: RIGHT FOOT COMPLETE - 3+ VIEW COMPARISON:  10/01/2012 FINDINGS: No visible bone destruction within the great toe to suggest osteomyelitis. No fracture, subluxation or dislocation. Advanced degenerative changes in the midfoot and hindfoot. Large calcaneal spurs. Vascular calcifications. IMPRESSION: No acute bony abnormality. Note radiographic changes of osteomyelitis. Electronically Signed   By: Rolm Baptise M.D.   On: 02/13/2018 17:27   Xr Foot Complete Left  Result Date: 02/10/2018 Radiographs left foot are negative.  No acute finding.  No sign of osteomyelitis great toe.     Subjective:  Feels better today she wishes to go home she was recommended rehab at nursing facility but has denied.  Denies  overt pain.  No shortness of breath fever or cough.  Discharge Exam: Vitals:   03/09/18 2040 03/10/18 0455  BP: (!) 158/91 140/72  Pulse: 64 69  Resp: 17 15  Temp: 97.6 F (36.4 C) 97.8 F (36.6 C)  SpO2: 100% 95%   Vitals:   03/09/18 0953 03/09/18 1549 03/09/18 2040 03/10/18 0455  BP: (!) 151/70 (!) 145/66 (!) 158/91 140/72  Pulse: 74 67 64 69  Resp: 18  17 15   Temp: 97.9 F (36.6 C) (!) 97.5 F (36.4 C) 97.6 F (36.4 C) 97.8 F (36.6 C)  TempSrc: Oral Oral Oral Oral  SpO2: 98% 97% 100% 95%  Weight:      Height:        General: Pt is alert, awake, not in acute distress Cardiovascular: RRR, S1/S2 +, no rubs, no gallops Respiratory: CTA bilaterally, no wheezing, no rhonchi Abdominal: Soft, NT, ND, bowel sounds + CNS: non focal. Extremities: Left foot wrapped in compression stockings.  Left middle finger without significant erythema, improved flexion of the finger.  The results of significant diagnostics from this hospitalization (including imaging, microbiology, ancillary and laboratory) are listed below for reference.    Microbiology: No results found for this or any previous visit (from the past 240 hour(s)).    Labs: BNP (last 3 results) Recent Labs    04/29/17 1109 02/13/18 1247  BNP 96.3 017.5*   Basic Metabolic Panel: Recent Labs  Lab 03/05/18 1911 03/06/18 0422 03/07/18 0622 03/10/18 0249  NA 138 140 139 135  K 3.7 3.5 4.1 4.1  CL 104 107 104 93*  CO2 26 24 21* 32  GLUCOSE 154* 132* 96 291*  BUN 45* 40* 33* 58*  CREATININE 1.93* 1.60* 1.67* 1.76*  CALCIUM 8.8* 8.5* 8.5* 9.2   Liver Function Tests: No results for input(s): AST, ALT, ALKPHOS, BILITOT, PROT, ALBUMIN in the last 168 hours. No results for input(s): LIPASE, AMYLASE in the last 168 hours. No results for input(s): AMMONIA in the last 168 hours. CBC: Recent Labs  Lab 03/05/18 1911 03/06/18 0422 03/07/18 0737 03/10/18 0249  WBC 5.6 4.1 4.1 7.2  NEUTROABS 4.0  --   --   --   HGB 10.1* 9.5* 9.5* 10.5*  HCT 34.2* 32.2* 32.8* 34.7*  MCV 79.4* 78.0* 77.9* 74.8*  PLT 326 302 321 363   Cardiac Enzymes: No results for input(s): CKTOTAL, CKMB, CKMBINDEX, TROPONINI in the last 168 hours. BNP: Invalid input(s): POCBNP CBG: Recent Labs  Lab 03/09/18 1633 03/09/18 2035 03/10/18 0011 03/10/18 0448 03/10/18 0749  GLUCAP 401* 390* 347* 228* 170*   D-Dimer No results for input(s): DDIMER in the last 72 hours. Hgb A1c No results for input(s): HGBA1C in the last 72 hours. Lipid Profile No results for input(s): CHOL, HDL, LDLCALC, TRIG, CHOLHDL, LDLDIRECT in the last 72 hours. Thyroid function studies No results for input(s): TSH, T4TOTAL, T3FREE, THYROIDAB in the last 72 hours.  Invalid input(s): FREET3 Anemia work up No results for input(s): VITAMINB12, FOLATE, FERRITIN, TIBC, IRON, RETICCTPCT in the last 72 hours. Urinalysis    Component Value Date/Time   COLORURINE YELLOW 07/22/2017 2053   APPEARANCEUR CLOUDY (A) 07/22/2017 2053   LABSPEC 1.012 07/22/2017 2053   PHURINE 5.0 07/22/2017 2053   GLUCOSEU NEGATIVE 07/22/2017 2053   HGBUR SMALL (A) 07/22/2017 2053   BILIRUBINUR NEGATIVE 07/22/2017 2053    KETONESUR NEGATIVE 07/22/2017 2053   PROTEINUR 30 (A) 07/22/2017 2053   UROBILINOGEN 0.2 04/01/2014 1659   NITRITE NEGATIVE 07/22/2017 2053  LEUKOCYTESUR LARGE (A) 07/22/2017 2053   Sepsis Labs Invalid input(s): PROCALCITONIN,  WBC,  LACTICIDVEN Microbiology No results found for this or any previous visit (from the past 240 hour(s)).  Please note: You were cared for by a hospitalist during your hospital stay. Once you are discharged, your primary care physician will handle any further medical issues.   Time coordinating discharge: 40 minutes  SIGNED:  Flora Lipps, MD  [facility]

## 2018-03-10 NOTE — Progress Notes (Signed)
Called to pt room with report of pt distress. Pt up in recliner chair mumbling. Skin pale and clammy. SBP in the 60's. Assisted to bed with x 3 staff. Placed pt in [facility]. MD notified and new orders obtained. Given a bolus of NS via IV. Pt became more alert and responsive. Will continue to monitor.

## 2018-03-10 NOTE — Progress Notes (Signed)
Physical Therapy Treatment Patient Details Name: Ratasha Fabre MRN: [id number] DOB: [birth year] Today's Date: 03/10/2018    History of Present Illness Stevee Valenta is an 67 y.o. 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 Sharol Given (TWB R foot with postop shoe), started noticing increasing swelling and pain in the left hand unable to flex the fingers.  Since patient's symptoms progressed patient came to the ER.    PT Comments    Continuing work on functional mobility and activity tolerance;  This PT session occurred before the noted Rapid Response events;   Our session focused on wheelchair transfers and wheelchair mobility in prep for dc home at wheelchair transfer functional level; Overall good use of sliding board for scoot transfers; she was anxious with the transfers and we provided minguard/min assist for sliding board management and to boost her confidence;   Given events of this afternoon, will check on tomorrow   Follow Up Recommendations  SNF;Supervision/Assistance - 24 hour;Other (comment)(Refusing SNF, max HH serivces; consider medical van transport home)     Equipment Recommendations  None recommended by PT(I believe she is equipped from last admission)    Recommendations for Other Services OT consult(ordered per protocol)     Precautions / Restrictions Precautions Precautions: Fall Precaution Comments: Difficulty maintaining TWB R foot with any type of standing/stand pivot Required Braces or Orthoses: Other Brace/Splint Other Brace/Splint: post op shoe R foot Restrictions RLE Weight Bearing: Touchdown weight bearing    Mobility  Bed Mobility Overal bed mobility: Needs Assistance Bed Mobility: Supine to Sit     Supine to sit: Min guard     General bed mobility comments:  minguard for safety; no need for physical assist  Transfers Overall transfer level: Needs assistance Equipment used: Sliding board Transfers: Lateral/Scoot Transfers          Lateral/Scoot Transfers: Min assist General transfer comment: Performed lateral scoot transfer bed to wheelchair, then wheelchair to recliner with sliding board; Lots of demonstrational and verbal cues for technqiue; required assist for management of sliding board  Ambulation/Gait                 Stairs             Wheelchair Mobility Propelled wheelchair with Supervision and at times min assist due to UE fatigue    Modified Rankin (Stroke Patients Only)       Balance     Sitting balance-Leahy Scale: Good                                      Cognition Arousal/Alertness: Awake/alert Behavior During Therapy: WFL for tasks assessed/performed;Anxious Overall Cognitive Status: Within Functional Limits for tasks assessed                                 General Comments: Anxious and a bit emotional when talking about how much she wants to get home      Exercises      General Comments        Pertinent Vitals/Pain Pain Assessment: Faces Faces Pain Scale: Hurts a little bit Pain Location: L fingers; REports pain is much improved Pain Descriptors / Indicators:  Sore Pain Intervention(s): Monitored during session    Home Living                      Prior Function            PT Goals (current goals can now be found in the care plan section) Acute Rehab PT Goals Patient Stated Goal: to go home PT Goal Formulation: With patient Time For Goal Achievement: 03/23/18 Potential to Achieve Goals: Fair Progress towards PT goals: Progressing toward goals    Frequency    Min 3X/week      PT Plan Current plan remains appropriate    Co-evaluation              AM-PAC PT "6 Clicks" Daily Activity  Outcome Measure  Difficulty turning  over in bed (including adjusting bedclothes, sheets and blankets)?: A Little Difficulty moving from lying on back to sitting on the side of the bed? : A Little Difficulty sitting down on and standing up from a chair with arms (e.g., wheelchair, bedside commode, etc,.)?: Unable Help needed moving to and from a bed to chair (including a wheelchair)?: A Little Help needed walking in hospital room?: Total Help needed climbing 3-5 steps with a railing? : Total 6 Click Score: 12    End of Session Equipment Utilized During Treatment: Other (comment)(sliding board) Activity Tolerance: Patient tolerated treatment well Patient left: in chair;with call bell/phone within reach Nurse Communication: Mobility status;Other (comment)(suggestions for back to bed) PT Visit Diagnosis: Other abnormalities of gait and mobility (R26.89);History of falling (Z91.81)     Time: 8916-9450 PT Time Calculation (min) (ACUTE ONLY): 34 min  Charges:  $Therapeutic Activity: 8-22 mins $Wheel Chair Management: 8-22 mins                    Roney Marion, [place]  Acute Rehabilitation Services Pager [phone] Office [place] 03/10/2018, 4:21 PM

## 2018-03-10 NOTE — Progress Notes (Signed)
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  No trauma or insect bite.  X-ray were unrevealing.  Orthopedics reviewed and at this time did not recommend surgical intervention and recommended treatment with antibiotics, but no need for surgical intervention.  Patient also found to have an elevated uric acid level at 9 and started on p.o. Prednisone.  Patient received IV antibiotics in the hospital for presumed cellulitis.  Assessment/Plan:  Acute hypotension with transient change in mental status and hypoxia.  Rapid response was called in.  Patient received 1 L of normal saline with improvement in her blood pressure..  Blood glucose was reviewed patient was not hypoglycemic.  Patient was also diaphoretic and this happened after physical therapy.  There is a possibility of orthostatic hypotension/vasovagal response.  Unsure why she was hypoxic though.  Chest x-ray was unremarkable.  Will get a VQ scan of the chest to rule out PE.  We will closely monitor today.  Patient had delayed speech but did not have any focal neurological deficits.  ABG reviewed with mild hypoxia.  Will hold further torsemide doses.  Keep holding parameters on antihypertensives.  Left long finger redness pain and swelling- significantly improved on prednisone and allopurinol for presumed trigger finger with gout.  Will consider colchicine and allopurinol on discharge with steroid taper. Changed to oral antibiotics clindamycin since yesterday to complete the course.  Diabetes  mellitus type II.  Continue on sliding scale insulin Accu-Cheks.  Patient currently on prednisone we will continue to monitor blood glucose levels.  NPH was added to the regimen better control of blood glucose levels.  Diastolic congestive heart failure.  Patient is currently euvolemic.  Continue to monitor closely.  If VQ scan is negative and patient remains persistently hypoxic could give IV diuretics but the patient was already on torsemide.  CKD stage III.  Stable.  Monitor BMP.  Pulmonary hypertension-peripheral vascular disease.  Stable.  Acute gouty flare.  On colchicine and allopurinol, seen by orthopedics.  Constipation.  Resolved  Status post recent right foot amputation.  Well-healing first and second toe amputation of the right foot.  Seen by Dr. Sharol Given orthopedics.  Patient was advised to apply dry dressing and wear knee-high medical compression stockings.  Patient is advised touchdown weightbearing on the right.  Will need to follow-up with orthopedics as outpatient in 1 week to harvest the sutures.  She has been seen by physical therapy.  Patient has been recommended skilled nursing facility but patient has refused.  She is willing to go home with home health services.  Code Status: Full code  Family Communication: Lives alone  Disposition Plan: She had a rapid response so we will closely monitor today.  Potential discharge home with home health tomorrow.  Patient has declined skilled nursing facility placement.   Consultants:  Orthopedics  Procedures:  None  Antimicrobials: P.o. clindamycin since 03/09/2018  DVT prophylaxis: Lovenox  Subjective:  Patient was seen after rapid response and also in the morning..  Patient was hypotensive diaphoretic and hypoxic after physical therapy today  Objective: Vitals:  03/10/18 1354 03/10/18 1500  BP:  (!) 106/54  Pulse: 61 (!) 59  Resp: 18   Temp:    SpO2: 98%     Intake/Output Summary (Last 24 hours) at 03/10/2018  1614 Last data filed at 03/10/2018 1449 Gross per 24 hour  Intake 1077 ml  Output 4400 ml  Net -3323 ml   Filed Weights   03/05/18 2142 03/07/18 0535  Weight: 124.7 kg 129 kg   Body mass index is 47.33 kg/m.  Physical Examination: General exam: Appears calm and comfortable ,Not in distress, slow to speech but oriented HEENT:PERRL,Oral mucosa moist Respiratory system: Bilateral equal air entry, normal vesicular breath sounds, no wheezes or crackles  Cardiovascular system: S1 & S2 heard, RRR.  Gastrointestinal system: Abdomen is nondistended, soft and nontender. No organomegaly or masses felt. Normal bowel sounds heard. Central nervous system: Alert and oriented. No focal neurological deficits.  Slow to speech Extremities: Left foot wrapped on compression stockings.  Left middle finger without significant erythema tenderness but restricted flexion.   Skin: No rashes, lesions or ulcers,no icterus ,no pallor MSK: Normal muscle bulk,tone ,power  Data Reviewed: CBC: Recent Labs  Lab 03/05/18 1911 03/06/18 0422 03/07/18 0737 03/10/18 0249  WBC 5.6 4.1 4.1 7.2  NEUTROABS 4.0  --   --   --   HGB 10.1* 9.5* 9.5* 10.5*  HCT 34.2* 32.2* 32.8* 34.7*  MCV 79.4* 78.0* 77.9* 74.8*  PLT 326 302 321 737   Basic Metabolic Panel: Recent Labs  Lab 03/05/18 1911 03/06/18 0422 03/07/18 0622 03/10/18 0249  NA 138 140 139 135  K 3.7 3.5 4.1 4.1  CL 104 107 104 93*  CO2 26 24 21* 32  GLUCOSE 154* 132* 96 291*  BUN 45* 40* 33* 58*  CREATININE 1.93* 1.60* 1.67* 1.76*  CALCIUM 8.8* 8.5* 8.5* 9.2   GFR: Estimated Creatinine Clearance: 41.4 mL/min (A) (by C-G formula based on SCr of 1.76 mg/dL (H)). Liver Function Tests: No results for input(s): AST, ALT, ALKPHOS, BILITOT, PROT, ALBUMIN in the last 168 hours. No results for input(s): LIPASE, AMYLASE in the last 168 hours. No results for input(s): AMMONIA in the last 168 hours. Coagulation Profile: No results for input(s): INR, PROTIME  in the last 168 hours. Cardiac Enzymes: No results for input(s): CKTOTAL, CKMB, CKMBINDEX, TROPONINI in the last 168 hours. BNP (last 3 results) No results for input(s): PROBNP in the last 8760 hours. HbA1C: No results for input(s): HGBA1C in the last 72 hours. CBG: Recent Labs  Lab 03/10/18 0011 03/10/18 0448 03/10/18 0749 03/10/18 1148 03/10/18 1335  GLUCAP 347* 228* 170* 127* 135*   Lipid Profile: No results for input(s): CHOL, HDL, LDLCALC, TRIG, CHOLHDL, LDLDIRECT in the last 72 hours. Thyroid Function Tests: No results for input(s): TSH, T4TOTAL, FREET4, T3FREE, THYROIDAB in the last 72 hours. Anemia Panel: No results for input(s): VITAMINB12, FOLATE, FERRITIN, TIBC, IRON, RETICCTPCT in the last 72 hours. Urine analysis:    Component Value Date/Time   COLORURINE YELLOW 07/22/2017 2053   APPEARANCEUR CLOUDY (A) 07/22/2017 2053   LABSPEC 1.012 07/22/2017 2053   PHURINE 5.0 07/22/2017 2053   GLUCOSEU NEGATIVE 07/22/2017 2053   HGBUR SMALL (A) 07/22/2017 2053   BILIRUBINUR NEGATIVE 07/22/2017 2053   KETONESUR NEGATIVE 07/22/2017 2053   PROTEINUR 30 (A) 07/22/2017 2053   UROBILINOGEN 0.2 04/01/2014 1659   NITRITE NEGATIVE 07/22/2017 2053   LEUKOCYTESUR LARGE (A) 07/22/2017 2053    Studies: Dg Chest 1 View  Result Date: 03/10/2018 CLINICAL DATA:  Dyspnea EXAM: CHEST  1 VIEW COMPARISON:  02/13/2018 FINDINGS: Mild cardiomegaly that is stable. Negative mediastinal contours. Improved aeration since prior. There is no edema, consolidation, effusion, or pneumothorax. IMPRESSION: No evidence of acute disease. Electronically Signed   By: Monte Fantasia M.D.   On: 03/10/2018 14:08    Scheduled Meds: . allopurinol  100 mg Oral BID  . aspirin EC  81 mg Oral Daily  . carvedilol  12.5 mg Oral BID WC  . cholecalciferol  1,000 Units Oral Daily  . clindamycin  300 mg Oral Q8H  . clopidogrel  75 mg Oral Q breakfast  . colchicine  0.6 mg Oral Daily  . escitalopram  20 mg Oral  Daily  . ferrous sulfate  325 mg Oral Q breakfast  . insulin NPH Human  10 Units Subcutaneous QAC breakfast  . insulin pump   Subcutaneous Q4H  . isosorbide dinitrate  10 mg Oral BID  . levothyroxine  50 mcg Oral QAC breakfast  . losartan  50 mg Oral Daily  . magnesium oxide  400 mg Oral Daily  . metolazone  5 mg Oral Q Wed  . polyethylene glycol  17 g Oral Daily  . predniSONE  20 mg Oral Q breakfast  . pregabalin  150 mg Oral BID  . rosuvastatin  20 mg Oral Daily  . senna-docusate  1 tablet Oral BID  . torsemide  80 mg Oral BID    Continuous Infusions: . sodium chloride       LOS: 3 days    Flora Lipps, MD [facility]  To reach me or the doctor on call, go to: [email or web address] Password [email or web address]  03/10/2018, 4:14 PM

## 2018-03-10 NOTE — Significant Event (Signed)
Rapid Response Event Note  Overview: Time Called: 1212 Arrival Time: 1215 Event Type: Hypotension  Initial Focused Assessment: Per Staff physical therapy evaluated patient and assisted her to the chair.  NT then assisted patient with a bath while sitting in the chair.  Patient suddenly became syncopal  BP 61/28 & 60/44  HR 58. Staff assisted patient back to bed.  BP 91/54  HR 63 Lung sounds clear, heart tones regular Patient feels very  Interventions: 1L NS bolus BP 90s-110 Patient fully alert and conversant.   Desat into 80s,  Placed on continuous pulse ox O2 sats 90-92.  Placed on 2L [place] When HOB raised from flat to greater than 45 degrees she became slow to respond then recovered after a few min.  MD at bedside to assess patient. PCXR done ABG done VQ scan done   Plan of Care (if not transferred): Rn to call if patient becomes hypotensive again or desats.    Event Summary: Name of Physician Notified: Pok[place]rel at 1210    at    Outcome: Stayed in room and stabalized  Event End Time: [place]  Raliegh Ip

## 2018-03-10 NOTE — Progress Notes (Signed)
Rapid at bedside with pt. Pt A+Ox4 with delays in verbal responses.Sats dropped to high 80s on RA.  Neuro assessment otherwise normal. MD notified and new orders obtained. Pt on O2 at 2L with 92-94% o2 sats. MD came to room and assessed pt. Awaiting test results and monitoring continues.

## 2018-03-11 ENCOUNTER — Other Ambulatory Visit: Payer: Self-pay | Admitting: Licensed Clinical Social Worker

## 2018-03-11 DIAGNOSIS — R0600 Dyspnea, unspecified: Secondary | ICD-10-CM

## 2018-03-11 LAB — GLUCOSE, CAPILLARY
Glucose-Capillary: 258 mg/dL — ABNORMAL HIGH (ref 70–99)
Glucose-Capillary: 199 mg/dL — ABNORMAL HIGH (ref 70–99)
Glucose-Capillary: 267 mg/dL — ABNORMAL HIGH (ref 70–99)
Glucose-Capillary: 292 mg/dL — ABNORMAL HIGH (ref 70–99)

## 2018-03-11 NOTE — Progress Notes (Signed)
Physical Therapy Treatment Patient Details Name: Susan [name] MRN: [id number] DOB: [birth year] Today's Date: 03/11/2018    History of Present Illness Susan [name] is an 67 y.o. 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 Sharol Given (TWB R foot with postop shoe), started noticing increasing swelling and pain in the left hand unable to flex the fingers.  Since patient's symptoms progressed patient came to the ER.    PT Comments    Patient seen for mobility progression. Pt requires assistance for bed mobility and safe OOB transfers using slide board. Pt educated on positioning of slide board and w/c prior to transfers, maintaining weight bearing status, and w/c parts and safety. Pt reports not being able to fit w/c in parts of her house like down the hallway and in her bathroom or beside her bed. Pt will need 24 hour assistance/supervision and maximized [facility] services if she is to d/c home. Continue to recommend SNF for further skilled PT services to maximize independence and safety with mobility.   Follow Up Recommendations  SNF;Supervision/Assistance - 24 hour;Other (comment)     Equipment Recommendations  Side board   Recommendations for Other Services OT consult     Precautions / Restrictions Precautions Precautions: Fall Precaution Comments: Difficulty maintaining TWB R foot with any type of standing/stand pivot Required Braces or Orthoses: Other Brace/Splint Other Brace/Splint: post op shoe R foot Restrictions Weight Bearing Restrictions: Yes RLE Weight Bearing: Touchdown weight bearing    Mobility  Bed Mobility Overal bed mobility: Needs Assistance Bed Mobility: Supine to Sit     Supine to sit: Min assist     General bed mobility comments: assist to bring hips to EOB and to elevate trunk  into sitting; cues for sequencing  Transfers Overall transfer level: Needs assistance Equipment used: Sliding board Transfers: Lateral/Scoot Transfers          Lateral/Scoot Transfers: Min assist;+2 safety/equipment General transfer comment: +2 for safety but +1 for physical assistance; cues for placement and safe use of slide board, positioning of w/c and use of w/c parts and sequencing when transfering   Ambulation/Gait                 Stairs             Wheelchair Mobility    Modified Rankin (Stroke Patients Only)       Balance Overall balance assessment: Needs assistance;History of Falls Sitting-balance support: No upper extremity supported;Feet supported Sitting balance-Leahy Scale: Good                                      Cognition Arousal/Alertness: Awake/alert Behavior During Therapy: WFL for tasks assessed/performed;Anxious Overall Cognitive Status: Within Functional Limits for tasks assessed                                 General Comments: anxious      Exercises Other Exercises Other Exercises: digit extension and flexion Other Exercises: wrist flexion and extension    General Comments        Pertinent Vitals/Pain Pain Assessment: Faces Faces Pain Scale: Hurts a little bit Pain Location: R foot Pain Descriptors / Indicators: Sore Pain  Intervention(s): Monitored during session;Repositioned    Home Living Family/patient expects to be discharged to:: Private residence Living Arrangements: Spouse/significant other Available Help at Discharge: Available PRN/intermittently(husband works, adult disabled son) Type of Home: House Home Access: [place]: One level [place]: Grab bars - tub/shower;Walker - 2 wheels;Walker - 4 wheels;Cane - single point;Wheelchair - manual      Prior Function Level of Independence: Needs assistance  Gait / Transfers Assistance Needed: was able to  perform self care prior to recent 10/3 admission; tells me she was working on transfers at [facility] for rehab ADL's / Homemaking Assistance Needed: was trying to do cooking and cleaning prior to recent 10/3 admission Comments: Pt typically ambulates with a RW and can perform self care.   PT Goals (current goals can now be found in the care plan section) Acute Rehab PT Goals Patient Stated Goal: "get home to her dog - [name]; be abe to drive again" Progress towards PT goals: Progressing toward goals    Frequency    Min 3X/week      PT Plan Current plan remains appropriate    Co-evaluation PT/OT/SLP Co-Evaluation/Treatment: Yes Reason for Co-Treatment: Complexity of the patient's impairments (multi-system involvement);For patient/therapist safety;To address functional/ADL transfers PT goals addressed during session: Mobility/safety with mobility;Proper use of DME;Strengthening/ROM OT goals addressed during session: ADL's and self-care;Proper use of Adaptive equipment and DME;Strengthening/ROM      AM-PAC PT "6 Clicks" Daily Activity  Outcome Measure  Difficulty turning over in bed (including adjusting bedclothes, sheets and blankets)?: Unable Difficulty moving from lying on back to sitting on the side of the bed? : Unable Difficulty sitting down on and standing up from a chair with arms (e.g., wheelchair, bedside commode, etc,.)?: Unable Help needed moving to and from a bed to chair (including a wheelchair)?: A Little Help needed walking in hospital room?: Total Help needed climbing 3-5 steps with a railing? : Total 6 Click Score: 8    End of Session   Activity Tolerance: Patient tolerated treatment well Patient left: in chair;with call bell/phone within reach Nurse Communication: Mobility status PT Visit Diagnosis: Other abnormalities of gait and mobility (R26.89);History of falling (Z91.81)     Time: 6759-1638 PT Time Calculation (min) (ACUTE ONLY): 44 min  Charges:   $Therapeutic Activity: 23-37 mins                     Earney Navy, PTA Acute Rehabilitation Services Pager: [phone] Office: [phone]     Darliss Cheney 03/11/2018, 4:47 PM

## 2018-03-11 NOTE — Patient Outreach (Signed)
Assessment:  CSW collaborated via phone  with Deloria Lair, Geriatric Nurse Practitioner, regarding client status and needs. Kayleen Memos suggested that at least short term skilled nursing care may be helpful to client at present. Client has recent surgery and also has recent swelling in her hand.  CSW called client on 03/11/18 and spoke via phone with client. Client and CSW spoke of skilled nursing care for client as a resource for client. However, client said she wanted to discharge from hospital to return to her home with home health agency support She said that doctor had talked this morning with her about discharge.  She said order had been sent to home health agency for her for physical therapy in the home 3 times weekly.  She said she is scheduled to return for an appointment with Dr. Sharol Given next Monday.  She said she is scheduled to discharge this afternoon from the hospital to return home. She said her spouse will provide transport for her to return home. She said she does have a wheelchair at home to use as needed. CSW encouraged client to call CSW or Deloria Lair as needed to discuss needs of client.  Client was appreciative of call from [place] on 03/11/18.  Plan:  CSW to call client as scheduled to assess client needs.  CSW to collaborate with Deloria Lair, Geriatric Nurse Practitioner, in monitoring client needs.  [name].Kavina Cantave MSW, LCSW Licensed Clinical Social Worker [facility] Care Management [phone]

## 2018-03-11 NOTE — Evaluation (Signed)
Occupational Therapy Evaluation Patient Details Name: Susan [name] MRN: [id number] DOB: September 18, 1950 Today's Date: 03/11/2018    History of Present Illness Susan [name] is an 67 y.o. 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 Sharol Given (TWB R foot with postop shoe), started noticing increasing swelling and pain in the left hand unable to flex the fingers.  Since patient's symptoms progressed patient came to the ER.   Clinical Impression   PTA Pt was at home working with therapy and had progressed to [facility] for ambulation and completing own ADL (prior to 10/3) Pt is currently min guard assist for lateral scoot transfers using sliding board. Today able to go from bed to wheelchair, and wheelchair to recliner. Discussed at length ADL and AE for ADL. Pt is very motivated and demonstrated greater ability today than with PT yesterday. Pt will require assist from son to assist with sliding board placement. Pt would truly benefit from SNF placement - but at this time declining so that she can go home and be with her 34 y/o dog "[name]". Pt will need max HH services - including OT and HHaide.     Follow Up Recommendations  Home health OT(Pt declining SNF at this time)    Equipment Recommendations  3 in 1 bedside commode    Recommendations for Other Services       Precautions / Restrictions Precautions Precautions: Fall Precaution Comments: Difficulty maintaining TWB R foot with any type of standing/stand pivot Required Braces or Orthoses: Other Brace/Splint Other Brace/Splint: post op shoe R foot Restrictions Weight Bearing Restrictions: Yes RLE Weight Bearing: Touchdown weight bearing LLE Weight Bearing: Touchdown weight bearing      Mobility Bed Mobility Overal bed mobility: Needs Assistance Bed Mobility:  Supine to Sit     Supine to sit: Min assist     General bed mobility comments: min A for   Transfers Overall transfer level: Needs assistance Equipment used: Sliding board Transfers: Lateral/Scoot Transfers          Lateral/Scoot Transfers: Min assist General transfer comment: Performed lateral scoot transfer bed to wheelchair, then wheelchair to recliner with sliding board; Lots of demonstrational and verbal cues for technqiue; required assist for management of sliding board    Balance Overall balance assessment: Needs assistance;History of Falls Sitting-balance support: No upper extremity supported;Feet supported Sitting balance-Leahy Scale: Good                                     ADL either performed or assessed with clinical judgement   ADL Overall ADL's : Needs assistance/impaired Eating/Feeding: Set up   Grooming: Set up;Sitting   Upper Body Bathing: Moderate assistance Upper Body Bathing Details (indicate cue type and reason): can do the front, not the back Lower Body Bathing: Moderate assistance;Sitting/lateral leans   Upper Body Dressing : Set up;Sitting   Lower Body Dressing: Maximal assistance;Bed level   Toilet Transfer: Min guard;Minimal Print production planner Details (indicate cue type and reason): using sliding board to transfer to [place] and Hygiene: Moderate assistance Toileting - Clothing Manipulation Details (indicate cue type and reason): able to get front peri care - not back     Functional mobility during ADLs: Wheelchair(transfer via sliding board) General ADL  Comments: limited access to LB for ADL due to obesity - LUE strength and function improving     Vision         Perception     Praxis      Pertinent Vitals/Pain Pain Assessment: Faces Faces Pain Scale: Hurts a little bit Pain Location: R foot Pain Descriptors / Indicators: Sore Pain Intervention(s): Monitored during  session;Repositioned     Hand Dominance Right   Extremity/Trunk Assessment Upper Extremity Assessment Upper Extremity Assessment: Generalized weakness;LUE deficits/detail LUE Deficits / Details: Pt able to perform grasp at 3/5 strength, almost full  LUE Sensation: WNL LUE Coordination: decreased fine motor   Lower Extremity Assessment Lower Extremity Assessment: Defer to PT evaluation   Cervical / Trunk Assessment Cervical / Trunk Assessment: Other exceptions Cervical / Trunk Exceptions: obesity   Communication Communication Communication: No difficulties   Cognition Arousal/Alertness: Awake/alert Behavior During Therapy: WFL for tasks assessed/performed;Anxious Overall Cognitive Status: Within Functional Limits for tasks assessed                                 General Comments: axious, very hard on herself about what she can do   General Comments       Exercises Exercises: Other exercises Other Exercises Other Exercises: digit extension and flexion Other Exercises: wrist flexion and extension   Shoulder Instructions      Home Living Family/patient expects to be discharged to:: Private residence Living Arrangements: Spouse/significant other Available Help at Discharge: Available PRN/intermittently(husband works, adult disabled son) Type of Home: House Home Access: Ramped entrance     Home Layout: One level     Bathroom Shower/Tub: Occupational psychologist: [place]: Grab bars - tub/shower;Walker - 2 wheels;Walker - 4 wheels;Cane - single point;Wheelchair - manual          Prior Functioning/Environment Level of Independence: Needs assistance  Gait / Transfers Assistance Needed: was able to perform self care prior to recent 10/3 admission; tells me she was working on transfers at [facility] for rehab ADL's / Homemaking Assistance Needed: was trying to do cooking and cleaning prior to recent 10/3 admission   Comments: Pt  typically ambulates with a RW and can perform self care.        OT Problem List: Decreased strength;Decreased range of motion;Decreased activity tolerance;Impaired balance (sitting and/or standing);Decreased safety awareness;Decreased knowledge of use of DME or AE;Decreased knowledge of precautions;Obesity;Impaired UE functional use;Pain      OT Treatment/Interventions: Self-care/ADL training;Therapeutic exercise;Therapeutic activities;Patient/family education;Balance training;DME and/or AE instruction    OT Goals(Current goals can be found in the care plan section) Acute Rehab OT Goals Patient Stated Goal: "get home to her dog - [name]; be abe to drive again" OT Goal Formulation: With patient Time For Goal Achievement: 03/25/18 Potential to Achieve Goals: Good ADL Goals Pt Will Perform Upper Body Bathing: with modified independence;with adaptive equipment Pt Will Perform Lower Body Bathing: with min guard assist;sitting/lateral leans;with adaptive equipment Pt Will Perform Lower Body Dressing: with mod assist;bed level Pt Will Transfer to Toilet: with min guard assist;squat pivot transfer;bedside commode Pt Will Perform Toileting - Clothing Manipulation and hygiene: with mod assist;sitting/lateral leans Pt/caregiver will Perform Home Exercise Program: Increased ROM;Increased strength;Left upper extremity;With written HEP provided Additional ADL Goal #1: Pt will perform bed mobility at mod I level prior to engaging in ADL activity  OT Frequency: Min 2X/week   Barriers  to D/C:            Co-evaluation PT/OT/SLP Co-Evaluation/Treatment: Yes Reason for Co-Treatment: Complexity of the patient's impairments (multi-system involvement);For patient/therapist safety;To address functional/ADL transfers PT goals addressed during session: Mobility/safety with mobility;Balance;Proper use of DME;Strengthening/ROM OT goals addressed during session: ADL's and self-care;Proper use of Adaptive  equipment and DME;Strengthening/ROM      AM-PAC PT "6 Clicks" Daily Activity     Outcome Measure Help from another person eating meals?: None Help from another person taking care of personal grooming?: None(in sitting) Help from another person toileting, which includes using toliet, bedpan, or urinal?: A Lot Help from another person bathing (including washing, rinsing, drying)?: A Lot Help from another person to put on and taking off regular upper body clothing?: A Little Help from another person to put on and taking off regular lower body clothing?: A Lot 6 Click Score: 17   End of Session Equipment Utilized During Treatment: Other (comment)(sliding board; wheelchair) Nurse Communication: Mobility status;Weight bearing status;Other (comment)(needs purewick)  Activity Tolerance: Patient tolerated treatment well Patient left: in chair;with call bell/phone within reach;with chair alarm set  OT Visit Diagnosis: Other abnormalities of gait and mobility (R26.89);Repeated falls (R29.6);History of falling (Z91.81);Muscle weakness (generalized) (M62.81);Pain Pain - Right/Left: Right Pain - part of body: Ankle and joints of foot                Time: 5625-6389 OT Time Calculation (min): 60 min Charges:  OT General Charges $OT Visit: 1 Visit OT Evaluation $OT Eval Moderate Complexity: 1 Mod OT Treatments $Self Care/Home Management : 8-22 mins  Hulda Humphrey OTR/L Acute Rehabilitation Services Pager: [phone] Office: [place] 03/11/2018, 3:53 PM

## 2018-03-12 ENCOUNTER — Other Ambulatory Visit: Payer: Self-pay

## 2018-03-15 ENCOUNTER — Encounter (INDEPENDENT_AMBULATORY_CARE_PROVIDER_SITE_OTHER): Payer: Self-pay | Admitting: Orthopedic Surgery

## 2018-03-15 ENCOUNTER — Ambulatory Visit (INDEPENDENT_AMBULATORY_CARE_PROVIDER_SITE_OTHER): Payer: PPO | Admitting: Orthopedic Surgery

## 2018-03-15 ENCOUNTER — Ambulatory Visit: Payer: Self-pay | Admitting: Pharmacist

## 2018-03-15 VITALS — Ht 65.0 in | Wt 284.4 lb

## 2018-03-15 DIAGNOSIS — L97929 Non-pressure chronic ulcer of unspecified part of left lower leg with unspecified severity: Secondary | ICD-10-CM

## 2018-03-15 DIAGNOSIS — L97919 Non-pressure chronic ulcer of unspecified part of right lower leg with unspecified severity: Secondary | ICD-10-CM

## 2018-03-15 DIAGNOSIS — I87333 Chronic venous hypertension (idiopathic) with ulcer and inflammation of bilateral lower extremity: Secondary | ICD-10-CM

## 2018-03-15 DIAGNOSIS — M86171 Other acute osteomyelitis, right ankle and foot: Secondary | ICD-10-CM

## 2018-03-16 ENCOUNTER — Encounter (INDEPENDENT_AMBULATORY_CARE_PROVIDER_SITE_OTHER): Payer: Self-pay | Admitting: Orthopedic Surgery

## 2018-03-16 ENCOUNTER — Other Ambulatory Visit (INDEPENDENT_AMBULATORY_CARE_PROVIDER_SITE_OTHER): Payer: Self-pay | Admitting: Orthopedic Surgery

## 2018-03-16 ENCOUNTER — Ambulatory Visit: Payer: Self-pay | Admitting: Pharmacist

## 2018-03-16 DIAGNOSIS — M86171 Other acute osteomyelitis, right ankle and foot: Secondary | ICD-10-CM | POA: Diagnosis not present

## 2018-03-16 DIAGNOSIS — Z7982 Long term (current) use of aspirin: Secondary | ICD-10-CM | POA: Diagnosis not present

## 2018-03-16 DIAGNOSIS — Z7902 Long term (current) use of antithrombotics/antiplatelets: Secondary | ICD-10-CM | POA: Diagnosis not present

## 2018-03-16 DIAGNOSIS — E1169 Type 2 diabetes mellitus with other specified complication: Secondary | ICD-10-CM | POA: Diagnosis not present

## 2018-03-16 DIAGNOSIS — E43 Unspecified severe protein-calorie malnutrition: Secondary | ICD-10-CM | POA: Diagnosis not present

## 2018-03-16 DIAGNOSIS — Z794 Long term (current) use of insulin: Secondary | ICD-10-CM | POA: Diagnosis not present

## 2018-03-16 DIAGNOSIS — Z955 Presence of coronary angioplasty implant and graft: Secondary | ICD-10-CM | POA: Diagnosis not present

## 2018-03-16 DIAGNOSIS — E1142 Type 2 diabetes mellitus with diabetic polyneuropathy: Secondary | ICD-10-CM | POA: Diagnosis not present

## 2018-03-16 DIAGNOSIS — Z87891 Personal history of nicotine dependence: Secondary | ICD-10-CM | POA: Diagnosis not present

## 2018-03-16 DIAGNOSIS — E039 Hypothyroidism, unspecified: Secondary | ICD-10-CM | POA: Diagnosis not present

## 2018-03-16 DIAGNOSIS — Z4781 Encounter for orthopedic aftercare following surgical amputation: Secondary | ICD-10-CM | POA: Diagnosis not present

## 2018-03-16 DIAGNOSIS — I110 Hypertensive heart disease with heart failure: Secondary | ICD-10-CM | POA: Diagnosis not present

## 2018-03-16 DIAGNOSIS — Z89411 Acquired absence of right great toe: Secondary | ICD-10-CM | POA: Diagnosis not present

## 2018-03-16 DIAGNOSIS — I87331 Chronic venous hypertension (idiopathic) with ulcer and inflammation of right lower extremity: Secondary | ICD-10-CM | POA: Diagnosis not present

## 2018-03-16 DIAGNOSIS — Z6841 Body Mass Index (BMI) 40.0 and over, adult: Secondary | ICD-10-CM | POA: Diagnosis not present

## 2018-03-16 DIAGNOSIS — Z89421 Acquired absence of other right toe(s): Secondary | ICD-10-CM | POA: Diagnosis not present

## 2018-03-16 DIAGNOSIS — I2510 Atherosclerotic heart disease of native coronary artery without angina pectoris: Secondary | ICD-10-CM | POA: Diagnosis not present

## 2018-03-16 DIAGNOSIS — I5032 Chronic diastolic (congestive) heart failure: Secondary | ICD-10-CM | POA: Diagnosis not present

## 2018-03-16 NOTE — Telephone Encounter (Signed)
We need to see her if she has infection

## 2018-03-16 NOTE — Progress Notes (Signed)
Office Visit Note   Patient: Susan [name]           Date of Birth: 11-19-1950           MRN: [id number] Visit Date: 03/15/2018              Requested by: Alycia Rossetti, MD [address] [place], [place] [address] PCP: Alycia Rossetti, MD  Chief Complaint  Patient presents with  . Right Leg - Routine Post Op    Great toe and 2nd Toe amp  . Left Leg - Routine Post Op      HPI: Patient is a 67 year old woman with venous insufficiency both lower extremities she is currently wearing double extra-large compression stockings.  She states the incision site has healed well.  Patient is status post great toe and second toe amputation approximately 3 weeks ago.  Assessment & Plan: Visit Diagnoses:  1. Acute osteomyelitis of toe, right ([place])   2. Idiopathic chronic venous hypertension of both lower extremities with ulcer and inflammation (HCC)     Plan: Sutures are harvested she is given a prescription to wear a extra-large stocking.  Follow-Up Instructions: Return in about 2 weeks (around 03/29/2018).   Ortho Exam  Patient is alert, oriented, no adenopathy, well-dressed, normal affect, normal respiratory effort. Examination the incisions are healing nicely sutures are harvested.  Her calf swelling is decreased significantly her calf is currently 43 cm in circumference which is a size smaller stocking.  There is no redness no cellulitis no drainage no signs of infection.  Imaging: No results found. No images are attached to the encounter.  Labs: Lab Results  Component Value Date   HGBA1C 9.3 (H) 09/25/2017   HGBA1C 11.4 (H) 07/16/2017   HGBA1C 10.2 (H) 02/27/2017   CRP 8.4 (H) 03/05/2018   LABURIC 9.0 (H) 03/06/2018   REPTSTATUS 07/25/2017 FINAL 07/22/2017   CULT >=100,000 COLONIES/mL KLEBSIELLA PNEUMONIAE (A) 07/22/2017   LABORGA KLEBSIELLA PNEUMONIAE (A) 07/22/2017     Lab Results  Component Value Date   ALBUMIN 2.6 (L) 02/14/2018   ALBUMIN 2.1 (L) 07/24/2017    ALBUMIN 2.4 (L) 07/23/2017   LABURIC 9.0 (H) 03/06/2018    Body mass index is 47.32 kg/m.  Orders:  No orders of the defined types were placed in this encounter.  No orders of the defined types were placed in this encounter.    Procedures: No procedures performed  Clinical Data: No additional findings.  ROS:  All other systems negative, except as noted in the HPI. Review of Systems  Objective: Vital Signs: Ht 5\' 5"  (1.651 m)   Wt 284 lb 6.2 oz (129 kg)   BMI 47.32 kg/m   Specialty Comments:  No specialty comments available.  PMFS History: Patient Active Problem List   Diagnosis Date Noted  . Cellulitis of hand, left 03/06/2018  . Cellulitis 03/06/2018  . Amputated toe of right foot ([place]) 02/24/2018  . Osteomyelitis of second toe of right foot ([place])   . Osteomyelitis of great toe of right foot ([place])   . 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 ([place])   . Acute on chronic diastolic (congestive) heart failure ([place]) 02/13/2018  . PVD (peripheral vascular disease) ([place]) 10/26/2017  . Hypothyroid 07/27/2017  . PAH (pulmonary artery hypertension) ([place])   . Impaired ambulation 07/19/2017  . Hyperosmolar non-ketotic state in patient with type 2 diabetes mellitus ([place]) 07/15/2017  . Leg cramps 02/27/2017  . Peripheral edema 01/12/2017  .  Diabetic neuropathy ([place]) 11/12/2016  . 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 mellitus ([place]) 10/24/2015  . Anemia 10/03/2015  . Generalized anxiety disorder 10/03/2015  . Insomnia 10/03/2015  . Diabetes mellitus type II, uncontrolled ([place]) 06/07/2015  . Chronic diastolic CHF (congestive heart failure) ([place]) 06/07/2015  . Non compliance with medical treatment 04/17/2014  . Rotator cuff tear 03/14/2014  . Morbid obesity ([place]) 09/23/2013  . Urinary incontinence   . MDD (major depressive disorder) 11/12/2010  . RBBB (right bundle branch block)   . CAD (coronary artery disease)   . Hyperlipemia 01/22/2009  .  Essential hypertension 01/22/2009   Past Medical History:  Diagnosis Date  . Acute MI ([place]) 1999; 2007  . Anemia    hx  . Anginal pain ([place])   . Anxiety   . ARF (acute renal failure) ([place]) 06/2017    Kidney Asso  . Arthritis    "generalized" (03/15/2014)  . CAD (coronary artery disease)    MI in 2000 - MI  2007 - treated bare metal stent (no nuclear since then as 9/11)  . Carotid artery disease ([place])   . CHF (congestive heart failure) ([place])   . Chronic diastolic heart failure (HCC)    a) ECHO (08/2013) EF 55-60% and RV function nl b) RHC (08/2013) RA 4, RV 30/5/7, PA 25/10 (16), PCWP 7, Fick CO/CI 6.3/2.7, PVR 1.5 WU, PA 61 and 66%  . Daily headache    "~ every other day; since I fell in June" (03/15/2014)  . Depression   . Dyslipidemia   . Dyspnea   . Exertional shortness of breath   . HTN (hypertension)   . Hypothyroidism   . Neuropathy   . Obesity   . Osteoarthritis   . Peripheral neuropathy   . PONV (postoperative nausea and vomiting)   . RBBB (right bundle branch block)    Old  . Stroke [facility])    mini strokes  . Syncope    likely due to low blood sugar  . Tachycardia    Sinus tachycardia  . Type II diabetes mellitus (HCC)    Type II  . Urinary incontinence   . Venous insufficiency     Family History  Problem Relation Age of Onset  . Heart attack Mother 45    Past Surgical History:  Procedure Laterality Date  . ABDOMINAL HYSTERECTOMY  1980's  . AMPUTATION Right 02/24/2018   Procedure: RIGHT FOOT GREAT TOE AND 2ND TOE AMPUTATION;  Surgeon: Newt Minion, MD;  Location: [place];  Service: Orthopedics;  Laterality: Right;  . CATARACT EXTRACTION, BILATERAL Bilateral ?2013  . COLONOSCOPY W/ POLYPECTOMY    . CORONARY ANGIOPLASTY WITH STENT PLACEMENT  1999; 2007   "1 + 1"  . EYE SURGERY Bilateral    [facility]  . KNEE ARTHROSCOPY Left 10/25/2006  . RIGHT HEART CATH N/A 07/24/2017   Procedure: RIGHT HEART CATH;  Surgeon: Jolaine Artist, MD;  Location: [place] CV LAB;  Service: Cardiovascular;  Laterality: N/A;  . RIGHT HEART CATHETERIZATION N/A 09/22/2013   Procedure: RIGHT HEART CATH;  Surgeon: Jolaine Artist, MD;  Location[facility] C[facility] LAB;  Service: Cardiovascular;  Laterality: N/A;  . SHOULDER ARTHROSCOPY WITH OPEN ROTATOR CUFF REPAIR Right 03/14/2014   Procedure: RIGHT SHOULDER ARTHROSCOPY WITH BICEPS RELEASE, OPEN SUBSCAPULA REPAIR, OPEN SUPRASPINATUS REPAIR.;  Surgeon: Meredith Pel, MD;  Location: [place];  Service: Orthopedics;  Laterality: Right;  . TOE AMPUTATION Right 02/24/2018   GREAT TOE AND 2ND  TOE AMPUTATION  . TUBAL LIGATION  1970's   Social History   Occupational History    Employer: UNEMPLOYED  Tobacco Use  . Smoking status: Former Smoker    Packs/day: 3.00    Years: 32.00    Pack years: 96.00    Types: Cigarettes    Last attempt to quit: 10/24/1997    Years since quitting: 20.4  . Smokeless tobacco: Never Used  Substance and Sexual Activity  . Alcohol use: Not Currently    Comment: "might have 2-3 daiquiris in the summer"  . Drug use: No  . Sexual activity: Never

## 2018-03-16 NOTE — Telephone Encounter (Signed)
Pt was seen in office and yesterday and dictation is not complete did you want to refill this rx for pt.

## 2018-03-16 NOTE — Telephone Encounter (Signed)
ypou saw her yesterday and there is not a dictation to read

## 2018-03-17 ENCOUNTER — Encounter (HOSPITAL_COMMUNITY): Payer: PPO

## 2018-03-17 ENCOUNTER — Telehealth: Payer: Self-pay | Admitting: *Deleted

## 2018-03-17 NOTE — Telephone Encounter (Signed)
Received call from Amy, [facility] PT with [place] [phone] telephone.   Reports that patient has returned home S/P amputation of R great and 2cd toes. Requested VO to resume HH PT services 2x weekly x4 weeks. VO given.   Also requested OT evaluation. VO given.

## 2018-03-18 ENCOUNTER — Encounter (HOSPITAL_COMMUNITY): Payer: Self-pay

## 2018-03-18 ENCOUNTER — Emergency Department (HOSPITAL_COMMUNITY): Payer: PPO

## 2018-03-18 ENCOUNTER — Other Ambulatory Visit: Payer: Self-pay

## 2018-03-18 ENCOUNTER — Telehealth: Payer: Self-pay | Admitting: *Deleted

## 2018-03-18 ENCOUNTER — Inpatient Hospital Stay (HOSPITAL_COMMUNITY)
Admission: EM | Admit: 2018-03-18 | Discharge: 2018-03-21 | DRG: 683 | Disposition: A | Discharge status: Home Health | Payer: PPO | Attending: Internal Medicine | Admitting: Internal Medicine

## 2018-03-18 DIAGNOSIS — N184 Chronic kidney disease, stage 4 (severe): Secondary | ICD-10-CM | POA: Diagnosis present

## 2018-03-18 DIAGNOSIS — E1165 Type 2 diabetes mellitus with hyperglycemia: Secondary | ICD-10-CM | POA: Diagnosis not present

## 2018-03-18 DIAGNOSIS — E039 Hypothyroidism, unspecified: Secondary | ICD-10-CM | POA: Diagnosis present

## 2018-03-18 DIAGNOSIS — I739 Peripheral vascular disease, unspecified: Secondary | ICD-10-CM | POA: Diagnosis present

## 2018-03-18 DIAGNOSIS — I5030 Unspecified diastolic (congestive) heart failure: Secondary | ICD-10-CM | POA: Diagnosis not present

## 2018-03-18 DIAGNOSIS — I517 Cardiomegaly: Secondary | ICD-10-CM | POA: Diagnosis not present

## 2018-03-18 DIAGNOSIS — E1151 Type 2 diabetes mellitus with diabetic peripheral angiopathy without gangrene: Secondary | ICD-10-CM | POA: Diagnosis present

## 2018-03-18 DIAGNOSIS — Z7982 Long term (current) use of aspirin: Secondary | ICD-10-CM

## 2018-03-18 DIAGNOSIS — Z87891 Personal history of nicotine dependence: Secondary | ICD-10-CM | POA: Diagnosis not present

## 2018-03-18 DIAGNOSIS — N179 Acute kidney failure, unspecified: Principal | ICD-10-CM

## 2018-03-18 DIAGNOSIS — M79671 Pain in right foot: Secondary | ICD-10-CM | POA: Diagnosis present

## 2018-03-18 DIAGNOSIS — I10 Essential (primary) hypertension: Secondary | ICD-10-CM | POA: Diagnosis not present

## 2018-03-18 DIAGNOSIS — Z89421 Acquired absence of other right toe(s): Secondary | ICD-10-CM

## 2018-03-18 DIAGNOSIS — E861 Hypovolemia: Secondary | ICD-10-CM | POA: Diagnosis not present

## 2018-03-18 DIAGNOSIS — E1122 Type 2 diabetes mellitus with diabetic chronic kidney disease: Secondary | ICD-10-CM | POA: Diagnosis present

## 2018-03-18 DIAGNOSIS — E872 Acidosis: Secondary | ICD-10-CM | POA: Diagnosis not present

## 2018-03-18 DIAGNOSIS — I130 Hypertensive heart and chronic kidney disease with heart failure and stage 1 through stage 4 chronic kidney disease, or unspecified chronic kidney disease: Secondary | ICD-10-CM | POA: Diagnosis not present

## 2018-03-18 DIAGNOSIS — I959 Hypotension, unspecified: Secondary | ICD-10-CM | POA: Diagnosis present

## 2018-03-18 DIAGNOSIS — R55 Syncope and collapse: Secondary | ICD-10-CM | POA: Diagnosis present

## 2018-03-18 DIAGNOSIS — I2510 Atherosclerotic heart disease of native coronary artery without angina pectoris: Secondary | ICD-10-CM | POA: Diagnosis present

## 2018-03-18 DIAGNOSIS — E876 Hypokalemia: Secondary | ICD-10-CM | POA: Diagnosis not present

## 2018-03-18 DIAGNOSIS — F329 Major depressive disorder, single episode, unspecified: Secondary | ICD-10-CM | POA: Diagnosis present

## 2018-03-18 DIAGNOSIS — E782 Mixed hyperlipidemia: Secondary | ICD-10-CM | POA: Diagnosis not present

## 2018-03-18 DIAGNOSIS — I25119 Atherosclerotic heart disease of native coronary artery with unspecified angina pectoris: Secondary | ICD-10-CM | POA: Diagnosis present

## 2018-03-18 DIAGNOSIS — Z7902 Long term (current) use of antithrombotics/antiplatelets: Secondary | ICD-10-CM | POA: Diagnosis not present

## 2018-03-18 DIAGNOSIS — Z955 Presence of coronary angioplasty implant and graft: Secondary | ICD-10-CM | POA: Diagnosis not present

## 2018-03-18 DIAGNOSIS — R740 Nonspecific elevation of levels of transaminase and lactic acid dehydrogenase [LDH]: Secondary | ICD-10-CM | POA: Diagnosis not present

## 2018-03-18 DIAGNOSIS — I272 Pulmonary hypertension, unspecified: Secondary | ICD-10-CM | POA: Diagnosis present

## 2018-03-18 DIAGNOSIS — I951 Orthostatic hypotension: Secondary | ICD-10-CM | POA: Diagnosis present

## 2018-03-18 DIAGNOSIS — E860 Dehydration: Secondary | ICD-10-CM | POA: Diagnosis present

## 2018-03-18 DIAGNOSIS — I952 Hypotension due to drugs: Secondary | ICD-10-CM

## 2018-03-18 DIAGNOSIS — Z794 Long term (current) use of insulin: Secondary | ICD-10-CM

## 2018-03-18 DIAGNOSIS — Z9641 Presence of insulin pump (external) (internal): Secondary | ICD-10-CM | POA: Diagnosis present

## 2018-03-18 DIAGNOSIS — I5032 Chronic diastolic (congestive) heart failure: Secondary | ICD-10-CM | POA: Diagnosis not present

## 2018-03-18 DIAGNOSIS — R7989 Other specified abnormal findings of blood chemistry: Secondary | ICD-10-CM

## 2018-03-18 DIAGNOSIS — I252 Old myocardial infarction: Secondary | ICD-10-CM | POA: Diagnosis not present

## 2018-03-18 DIAGNOSIS — L8990 Pressure ulcer of unspecified site, unspecified stage: Secondary | ICD-10-CM

## 2018-03-18 DIAGNOSIS — Z8249 Family history of ischemic heart disease and other diseases of the circulatory system: Secondary | ICD-10-CM

## 2018-03-18 DIAGNOSIS — E785 Hyperlipidemia, unspecified: Secondary | ICD-10-CM | POA: Diagnosis present

## 2018-03-18 DIAGNOSIS — S98921A Partial traumatic amputation of right foot, level unspecified, initial encounter: Secondary | ICD-10-CM | POA: Diagnosis not present

## 2018-03-18 LAB — COMPREHENSIVE METABOLIC PANEL
ALT: 17 U/L (ref 0–44)
AST: 17 U/L (ref 15–41)
Albumin: 3.8 g/dL (ref 3.5–5.0)
Alkaline Phosphatase: 92 U/L (ref 38–126)
Anion gap: 16 — ABNORMAL HIGH (ref 5–15)
BUN: 94 mg/dL — ABNORMAL HIGH (ref 8–23)
CO2: 28 mmol/L (ref 22–32)
Calcium: 9.8 mg/dL (ref 8.9–10.3)
Chloride: 88 mmol/L — ABNORMAL LOW (ref 98–111)
Creatinine, Ser: 3.33 mg/dL — ABNORMAL HIGH (ref 0.44–1.00)
GFR calc Af Amer: 15 mL/min — ABNORMAL LOW (ref >60)
GFR calc non Af Amer: 13 mL/min — ABNORMAL LOW (ref >60)
Glucose, Bld: 199 mg/dL — ABNORMAL HIGH (ref 70–99)
Potassium: 3.9 mmol/L (ref 3.5–5.1)
Sodium: 132 mmol/L — ABNORMAL LOW (ref 135–145)
Total Bilirubin: 0.9 mg/dL (ref 0.3–1.2)
Total Protein: 7.3 g/dL (ref 6.5–8.1)

## 2018-03-18 LAB — URINALYSIS, ROUTINE W REFLEX MICROSCOPIC
Bilirubin Urine: NEGATIVE
Glucose, UA: 150 mg/dL — AB
Ketones, ur: NEGATIVE mg/dL
Nitrite: NEGATIVE
Protein, ur: 100 mg/dL — AB
Specific Gravity, Urine: 1.008 (ref 1.005–1.030)
pH: 5 (ref 5.0–8.0)

## 2018-03-18 LAB — I-STAT CG4 LACTIC ACID, ED: Lactic Acid, Venous: 2.3 mmol/L (ref 0.5–1.9)

## 2018-03-18 LAB — CBC WITH DIFFERENTIAL/PLATELET
Abs Immature Granulocytes: 0.05 10*3/uL (ref 0.00–0.07)
Basophils Absolute: 0.1 10*3/uL (ref 0.0–0.1)
Basophils Relative: 1 %
Eosinophils Absolute: 0.2 10*3/uL (ref 0.0–0.5)
Eosinophils Relative: 2 %
HCT: 41.8 % (ref 36.0–46.0)
Hemoglobin: 12.6 g/dL (ref 12.0–15.0)
Immature Granulocytes: 1 %
Lymphocytes Relative: 11 %
Lymphs Abs: 1 10*3/uL (ref 0.7–4.0)
MCH: 22.4 pg — ABNORMAL LOW (ref 26.0–34.0)
MCHC: 30.1 g/dL (ref 30.0–36.0)
MCV: 74.2 fL — ABNORMAL LOW (ref 80.0–100.0)
Monocytes Absolute: 0.9 10*3/uL (ref 0.1–1.0)
Monocytes Relative: 10 %
Neutro Abs: 6.8 10*3/uL (ref 1.7–7.7)
Neutrophils Relative %: 75 %
Platelets: 249 10*3/uL (ref 150–400)
RBC: 5.63 MIL/uL — ABNORMAL HIGH (ref 3.87–5.11)
RDW: 16.1 % — ABNORMAL HIGH (ref 11.5–15.5)
WBC: 9 10*3/uL (ref 4.0–10.5)
nRBC: 0 % (ref 0.0–0.2)

## 2018-03-18 LAB — BRAIN NATRIURETIC PEPTIDE: B Natriuretic Peptide: 70.5 pg/mL (ref 0.0–100.0)

## 2018-03-18 LAB — TROPONIN I: Troponin I: 0.03 ng/mL (ref <0.03)

## 2018-03-18 LAB — CBG MONITORING, ED: Glucose-Capillary: 188 mg/dL — ABNORMAL HIGH (ref 70–99)

## 2018-03-18 MED ORDER — SODIUM CHLORIDE 0.9 % IV BOLUS
1000.0000 mL | Freq: Once | INTRAVENOUS | Status: AC
  Administered 2018-03-18: 1000 mL via INTRAVENOUS

## 2018-03-18 NOTE — ED Notes (Signed)
CBG- [id number]

## 2018-03-18 NOTE — ED Notes (Signed)
IV team at bedside 

## 2018-03-18 NOTE — ED Provider Notes (Signed)
[name]ERGENCY DEPARTMENT Provider Note   CSN: [id number] Arrival date & time: 03/18/18  1758     History   Chief Complaint Chief Complaint  Patient presents with  . Altered Mental Status  . Fatigue    HPI Susan [name] is a 67 y.o. female.  67 year old female, with PMH of CAD, HLD, MI, diabetes, presents with generalized weakness.  Per patient she per triage note patient was disoriented and did not know the family was and was noted to have a low blood pressure.  Patient currently alert and oriented x4.  She is only complaining of generalized weakness.  She denies any chest pain, shortness of breath, fevers, chills, nausea, vomiting, abdominal pain.  Patient was recently recently had right toe amputations and was admitted for cellulitis of bilateral hands.   Altered Mental Status   Associated symptoms include weakness (generalized).    Past Medical History:  Diagnosis Date  . Acute MI ([place]) 1999; 2007  . Anemia    hx  . Anginal pain ([place])   . Anxiety   . ARF (acute renal failure) ([place]) 06/2017   [facility]  . Arthritis    "generalized" (03/15/2014)  . CAD (coronary artery disease)    MI in 2000 - MI  2007 - treated bare metal stent (no nuclear since then as 9/11)  . Carotid artery disease ([place])   . CHF (congestive heart failure) ([place])   . Chronic diastolic heart failure (HCC)    a) ECHO (08/2013) EF 55-60% and RV function nl b) RHC (08/2013) RA 4, RV 30/5/7, PA 25/10 (16), PCWP 7, Fick CO/CI 6.3/2.7, PVR 1.5 WU, PA 61 and 66%  . Daily headache    "~ every other day; since I fell in June" (03/15/2014)  . Depression   . Dyslipidemia   . Dyspnea   . Exertional shortness of breath   . HTN (hypertension)   . Hypothyroidism   . Neuropathy   . Obesity   . Osteoarthritis   . Peripheral neuropathy   . PONV (postoperative nausea and vomiting)   . RBBB (right bundle branch block)    Old  . Stroke [facility])    mini strokes  . Syncope    likely due to low blood sugar  . Tachycardia    Sinus tachycardia  . Type II diabetes mellitus (HCC)    Type II  . Urinary incontinence   . Venous insufficiency     Patient Active Problem List   Diagnosis Date Noted  . Cellulitis of hand, left 03/06/2018  . Cellulitis 03/06/2018  . Amputated toe of right foot ([place]) 02/24/2018  . Osteomyelitis of second toe of right foot ([place])   . Osteomyelitis of great toe of right foot ([place])   . 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 ([place])   . Acute on chronic diastolic (congestive) heart failure ([place]) 02/13/2018  . PVD (peripheral vascular disease) ([place]) 10/26/2017  . Hypothyroid 07/27/2017  . PAH (pulmonary artery hypertension) ([place])   . Impaired ambulation 07/19/2017  . Hyperosmolar non-ketotic state in patient with type 2 diabetes mellitus ([place]) 07/15/2017  . Leg cramps 02/27/2017  . Peripheral edema 01/12/2017  . Diabetic neuropathy ([place]) 11/12/2016  . 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 mellitus ([place]) 10/24/2015  . Anemia 10/03/2015  . Generalized anxiety disorder 10/03/2015  . Insomnia 10/03/2015  . Diabetes mellitus type II, uncontrolled ([place]) 06/07/2015  . Chronic diastolic CHF (congestive heart failure) ([place]) 06/07/2015  .  Non compliance with medical treatment 04/17/2014  . Rotator cuff tear 03/14/2014  . Morbid obesity ([place]) 09/23/2013  . Urinary incontinence   . MDD (major depressive disorder) 11/12/2010  . RBBB (right bundle branch block)   . CAD (coronary artery disease)   . Hyperlipemia 01/22/2009  . Essential hypertension 01/22/2009    Past Surgical History:  Procedure Laterality Date  . ABDOMINAL HYSTERECTOMY  1980's  . AMPUTATION Right 02/24/2018   Procedure: RIGHT FOOT GREAT TOE AND 2ND TOE AMPUTATION;  Surgeon: Newt Minion, MD;  Location: [facility];  Service: Orthopedics;  Laterality: Right;  . CATARACT EXTRACTION, BILATERAL Bilateral ?2013  . COLONOSCOPY W/ POLYPECTOMY    . CORONARY ANGIOPLASTY  WITH STENT PLACEMENT  1999; 2007   "1 + 1"  . EYE SURGERY Bilateral    [facility]  . KNEE ARTHROSCOPY Left 10/25/2006  . RIGHT HEART CATH N/A 07/24/2017   Procedure: RIGHT HEART CATH;  Surgeon: Jolaine Artist, MD;  Location: [facility] CV LAB;  Service: Cardiovascular;  Laterality: N/A;  . RIGHT HEART CATHETERIZATION N/A 09/22/2013   Procedure: RIGHT HEART CATH;  Surgeon: Jolaine Artist, MD;  Location: [facility] CATH LAB;  Service: Cardiovascular;  Laterality: N/A;  . SHOULDER ARTHROSCOPY WITH OPEN ROTATOR CUFF REPAIR Right 03/14/2014   Procedure: RIGHT SHOULDER ARTHROSCOPY WITH BICEPS RELEASE, OPEN SUBSCAPULA REPAIR, OPEN SUPRASPINATUS REPAIR.;  Surgeon: Meredith Pel, MD;  Location: [facility];  Service: Orthopedics;  Laterality: Right;  . TOE AMPUTATION Right 02/24/2018   GREAT TOE AND 2ND TOE AMPUTATION  . TUBAL LIGATION  1970's     OB History   None      Home Medications    Prior to Admission medications   Medication Sig Start Date End Date Taking? Authorizing Provider  albuterol (PROVENTIL HFA;VENTOLIN HFA) 108 (90 BASE) MCG/ACT inhaler Inhale 2 puffs into the lungs every 6 (six) hours as needed for wheezing or shortness of breath. Reported on 10/30/2015    [provider]  allopurinol (ZYLOPRIM) 100 MG tablet Take 1 tablet (100 mg total) by mouth 2 (two) times daily. 03/10/18   Pokhrel, Corrie Mckusick, MD  aspirin EC 81 MG tablet Take 81 mg by mouth daily.    [provider]  carvedilol (COREG) 12.5 MG tablet TAKE 1 TABLET BY MOUTH TWICE A DAY WITH A MEAL 03/01/18   [place], [name], MD  cholecalciferol (VITAMIN D) 1000 units tablet Take 1,000 Units by mouth daily.    [provider]  clopidogrel (PLAVIX) 75 MG tablet TAKE 1 TABLET BY MOUTH DAILY WITH BREAKFAST. Patient taking differently: Take 75 mg by mouth daily.  11/23/17   Larey Dresser, MD  colchicine 0.6 MG tablet Take 1 tablet (0.6 mg total) by mouth daily. 03/10/18   Pokhrel, Corrie Mckusick, MD  escitalopram  (LEXAPRO) 20 MG tablet Take 1 tablet (20 mg total) by mouth daily. 10/26/17   Alycia Rossetti, MD  ferrous sulfate 325 (65 FE) MG tablet Take 325 mg by mouth daily with breakfast. Reported on 08/21/2015    [provider]  HYDROcodone-acetaminophen (NORCO/VICODIN) 5-325 MG tablet Take 1 tablet by mouth every 4 (four) hours as needed for moderate pain. 02/25/18   Newt Minion, MD  Insulin Human (INSULIN PUMP) SOLN Inject into the skin.    [provider]  insulin regular (NOVOLIN R,HUMULIN R) 100 units/mL injection 3 (three) times daily before meals.    [provider]  isosorbide dinitrate (ISORDIL) 10 MG tablet TAKE 1 TABLET BY  MOUTH TWICE A DAY Patient taking differently: Take 10 mg by mouth 2 (two) times daily.  08/17/17   Bensimhon, Shaune Pascal, MD  levothyroxine (SYNTHROID, LEVOTHROID) 50 MCG tablet Take 1 tablet (50 mcg total) by mouth daily before breakfast. 11/07/16   [name], PA-C  losartan (COZAAR) 50 MG tablet Take 1 tablet (50 mg total) by mouth daily. 02/12/18   Alycia Rossetti, MD  Magnesium Oxide 400 (240 Mg) MG TABS TAKE 1 TABLET BY MOUTH EVERY DAY Patient taking differently: TAKE 240 mg TABLET BY MOUTH EVERY DAY 06/23/17   [name], MD  metolazone (ZAROXOLYN) 2.5 MG tablet TAKE 1 TABLET BY MOUTH ONCE A WEEK. 02/22/18   Bensimhon, Shaune Pascal, MD  metolazone (ZAROXOLYN) 5 MG tablet Take 1 tablet (5 mg total) by mouth once a week. 09/25/17   [place], [name], MD  nitroGLYCERIN (NITROSTAT) 0.4 MG SL tablet PLACE 1 TABLET (0.4 MG TOTAL) UNDER THE TONGUE EVERY 5 (FIVE) MINUTES AS NEEDED FOR CHEST PAIN. 08/28/16   Dena Billet B, PA-C  ondansetron (ZOFRAN) 4 MG tablet Take 1 tablet (4 mg total) by mouth every 8 (eight) hours as needed for nausea or vomiting. 01/13/18   Alycia Rossetti, MD  [facility] VERIO test strip  11/02/17   [provider]  predniSONE (DELTASONE) 20 MG tablet Take 1 tablet (20 mg total) by mouth daily with breakfast. 03/10/18    Pokhrel, Corrie Mckusick, MD  pregabalin (LYRICA) 150 MG capsule Take 1 capsule (150 mg total) by mouth 2 (two) times daily. 11/12/16   Alycia Rossetti, MD  promethazine (PHENERGAN) 12.5 MG tablet Take 1 tablet (12.5 mg total) by mouth every 6 (six) hours as needed for nausea or vomiting. 03/02/18   [place], Neta Mends, PA-C  rosuvastatin (CRESTOR) 20 MG tablet TAKE 1 TABLET BY MOUTH EVERY DAY Patient taking differently: Take 20 mg by mouth daily.  12/30/17   Alycia Rossetti, MD  torsemide (DEMADEX) 20 MG tablet Take 80 mg by mouth 2 (two) times daily.    [provider]  vitamin E (VITAMIN E) 1000 UNIT capsule Take 1,000 Units by mouth daily.    [provider]    Family History Family History  Problem Relation Age of Onset  . Heart attack Mother 28    Social History Social History   Tobacco Use  . Smoking status: Former Smoker    Packs/day: 3.00    Years: 32.00    Pack years: 96.00    Types: Cigarettes    Last attempt to quit: 10/24/1997    Years since quitting: 20.4  . Smokeless tobacco: Never Used  Substance Use Topics  . Alcohol use: Not Currently    Comment: "might have 2-3 daiquiris in the summer"  . Drug use: No     Allergies   Codeine   Review of Systems Review of Systems  Constitutional: Negative for chills and fever.  HENT: Negative for rhinorrhea and sore throat.   Eyes: Negative for visual disturbance.  Respiratory: Negative for cough and shortness of breath.   Cardiovascular: Negative for chest pain and leg swelling.  Gastrointestinal: Negative for abdominal pain, diarrhea, nausea and vomiting.  Genitourinary: Negative for dysuria, frequency and urgency.  Musculoskeletal: Negative for joint swelling and neck pain.  Skin: Negative for rash and wound.  Neurological: Positive for weakness (generalized). Negative for syncope and numbness.  All other systems reviewed and are negative.    Physical Exam Updated Vital Signs BP 118/60  Pulse  65   Temp (!) 97.5 F (36.4 C) (Oral)   Resp 15   SpO2 100%   Physical Exam  Constitutional: She is oriented to person, place, and time. She appears well-developed and well-nourished.  HENT:  Head: Normocephalic and atraumatic.  Eyes: Conjunctivae and EOM are normal.  Neck: Neck supple.  Cardiovascular: Normal rate, regular rhythm and normal heart sounds.  No murmur heard. Pulmonary/Chest: Effort normal and breath sounds normal. No respiratory distress. She has no wheezes. She has no rales.  Abdominal: Soft. Bowel sounds are normal. She exhibits no distension. There is no tenderness.  Musculoskeletal: Normal range of motion. She exhibits no tenderness or deformity.  Feet:  Right Foot:  Skin Integrity: Positive for erythema (very minimal erythema at the amputation site, of right toes) and dry skin. Negative for warmth.  Neurological: She is alert and oriented to person, place, and time.  Skin: Skin is warm and dry. No rash noted. No erythema.  Psychiatric: She has a normal mood and affect. Her behavior is normal.  Nursing note and vitals reviewed.    ED Treatments / Results  Labs (all labs ordered are listed, but only abnormal results are displayed) Labs Reviewed  CBG MONITORING, ED - Abnormal; Notable for the following components:      Result Value   Glucose-Capillary 188 (*)    All other components within normal limits  CULTURE, BLOOD (ROUTINE X 2)  CULTURE, BLOOD (ROUTINE X 2)  COMPREHENSIVE METABOLIC PANEL  URINALYSIS, ROUTINE W REFLEX MICROSCOPIC  TROPONIN I  BRAIN NATRIURETIC PEPTIDE  CBC WITH DIFFERENTIAL/PLATELET  I-STAT CG4 LACTIC ACID, ED    EKG EKG Interpretation  Date/Time:  Thursday March 18 2018 18:19:13 EDT Ventricular Rate:  71 PR Interval:  164 QRS Duration: 116 QT Interval:  448 QTC Calculation: 486 R Axis:   -64 Text Interpretation:  Normal sinus rhythm Left axis deviation RSR' or QR pattern in V1 suggests right ventricular conduction delay  Inferior infarct , age undetermined Anterolateral infarct , age undetermined Abnormal ECG No significant change since last tracing Confirmed by Isla Pence [phone]) on 03/18/2018 6:42:02 PM   Radiology No results found.  Procedures Procedures (including critical care time)  Medications Ordered in ED Medications  sodium chloride 0.9 % bolus 1,000 mL (1,000 mLs Intravenous New Bag/Given 03/18/18 1922)     Initial Impression / Assessment and Plan / ED Course  I have reviewed the triage vital signs and the nursing notes.  Pertinent labs & imaging results that were available during my care of the patient were reviewed by me and considered in my medical decision making (see chart for details).    7:51 PM Comfortable in bed, no acute distress, vital signs stable.  Blood pressure has improved.  Blood work still pending at this time.  Patient is normotensive, not tachycardic, afebrile.  Patient's blood work shows an AKI.  She has had a liter of fluids and is resting comfortably.  Chest x-ray and urine show no signs of infection.  Discussed case with hospitalist, Dr. Jonelle Sidle and he will see and admit the patient.  Final Clinical Impressions(s) / ED Diagnoses   Final diagnoses:  None    ED Discharge Orders    None       Etter Sjogren, PA-C 03/19/18 0157    Isla Pence, MD 03/20/18 1043

## 2018-03-18 NOTE — ED Notes (Signed)
Attempted report 

## 2018-03-18 NOTE — Telephone Encounter (Signed)
Received call from [place], [facility] PT with [facility].   Reports that she was out to see patient today and noted BP really low. 70/50.  States that patient voiced C/O fatigue and weakness and felling washed out. Refused to go to ER/ UC for evaluation.   Call placed to patient. States that she is feeling really bad. Advised to go to ER for evaluation. States that she will go.

## 2018-03-18 NOTE — ED Triage Notes (Signed)
Pt here for new onset AMS.  Family brought pt here after going to her home and pt did not recognize them or realize what is going on.  Hypotensive in triage.  Zoning on and off during triage.

## 2018-03-18 NOTE — ED Notes (Signed)
Jonelle Sidle, MD at bedside

## 2018-03-18 NOTE — H&P (Signed)
History and Physical   Susan [name] JWJ:[id number] DOB: 10-29-1950 DOA: 03/18/2018  Referring MD/NP/PA: Nonda Lou, PA  PCP: Alycia Rossetti, MD    Patient coming from: Home  Chief Complaint: Passing out  HPI: Susan [name] is a 67 y.o. female with medical history significant of insulin-dependent diabetes on insulin pump, diastolic dysfunction CHF, chronic kidney disease stage III, hypothyroidism, coronary artery disease, peripheral vascular disease status post recent amputation as well as pulmonary hypertension who was just discharged from the hospital about a week ago after admission with hand swelling.  Patient was evaluated at the time with a left long finger redness.  IV antibiotics given and discharged home on clindamycin.  She was home last night went she apparently passed out completely.  At the time blood pressures was 70 systolic.  Patient was completely confused with altered mental status.  Family were unable to have patient respond appropriately.  She was brought to the emergency room where she is currently fully awake and alert.  Her blood pressure is back into the 621H systolic.  Patient is on multiple blood pressure medications including high-dose Lasix 80 mg twice a day with metolazone as well as multiple blood pressure medications.  She has been taking her medicines as prescribed.  She is also been taking her insulin.  Blood sugar does not appear to be low.  She has no new acute on chronic kidney disease and evidence of dehydration.  She has received boluses of fluid in the ER and is currently stable.  Patient is being admitted with diagnosis of syncopal episode for work-up..  ED Course: Temperature is 97.5 initial blood pressure 87/57 with a pulse of 70 respiratory 21 oxygen sat 97% room air.  White count is 9.0 with hemoglobin 12.6 and platelet 249.  Sodium is 132 potassium 3.9 chloride 88 CO2 28 BUN 94 creatinine 3.33 and calcium 9.8.  Troponin 0 0.03 and lactic acid 2.3.   Chest x-ray showed no active disease.  EKG showed normal sinus rhythm with a rate of 71 with no significant ST changes.  Patient received multiple boluses of IV saline and is being admitted for further work-up.  Review of Systems: As per HPI otherwise 10 point review of systems negative.    Past Medical History:  Diagnosis Date  . Acute MI ([place]) 1999; 2007  . Anemia    hx  . Anginal pain ([place])   . Anxiety   . ARF (acute renal failure) ([place]) 06/2017   [facility]  . Arthritis    "generalized" (03/15/2014)  . CAD (coronary artery disease)    MI in 2000 - MI  2007 - treated bare metal stent (no nuclear since then as 9/11)  . Carotid artery disease ([place])   . CHF (congestive heart failure) ([place])   . Chronic diastolic heart failure (HCC)    a) ECHO (08/2013) EF 55-60% and RV function nl b) RHC (08/2013) RA 4, RV 30/5/7, PA 25/10 (16), PCWP 7, Fick CO/CI 6.3/2.7, PVR 1.5 WU, PA 61 and 66%  . Daily headache    "~ every other day; since I fell in June" (03/15/2014)  . Depression   . Dyslipidemia   . Dyspnea   . Exertional shortness of breath   . HTN (hypertension)   . Hypothyroidism   . Neuropathy   . Obesity   . Osteoarthritis   . Peripheral neuropathy   . PONV (postoperative nausea and vomiting)   . RBBB (right bundle branch block)  Old  . Stroke ([place])    mini strokes  . Syncope    likely due to low blood sugar  . Tachycardia    Sinus tachycardia  . Type II diabetes mellitus (HCC)    Type II  . Urinary incontinence   . Venous insufficiency     Past Surgical History:  Procedure Laterality Date  . ABDOMINAL HYSTERECTOMY  1980's  . AMPUTATION Right 02/24/2018   Procedure: RIGHT FOOT GREAT TOE AND 2ND TOE AMPUTATION;  Surgeon: Newt Minion, MD;  Location: [place];  Service: Orthopedics;  Laterality: Right;  . CATARACT EXTRACTION, BILATERAL Bilateral ?2013  . COLONOSCOPY W/ POLYPECTOMY    . CORONARY ANGIOPLASTY WITH STENT PLACEMENT  1999; 2007   "1 + 1"  . EYE  SURGERY Bilateral    [facility]  . KNEE ARTHROSCOPY Left 10/25/2006  . RIGHT HEART CATH N/A 07/24/2017   Procedure: RIGHT HEART CATH;  Surgeon: Jolaine Artist, MD;  Location: [place];  Service: Cardiovascular;  Laterality: N/A;  . RIGHT HEART CATHETERIZATION N/A 09/22/2013   Procedure: RIGHT HEART CATH;  Surgeon: Jolaine Artist, MD;  Location: [facility] CATH [facility];  Service: Cardiovascular;  Laterality: N/A;  . SHOULDER ARTHROSCOPY WITH OPEN ROTATOR CUFF REPAIR Right 03/14/2014   Procedure: RIGHT SHOULDER ARTHROSCOPY WITH BICEPS RELEASE, OPEN SUBSCAPULA REPAIR, OPEN SUPRASPINATUS REPAIR.;  Surgeon: Meredith Pel, MD;  Location: [place];  Service: Orthopedics;  Laterality: Right;  . TOE AMPUTATION Right 02/24/2018   GREAT TOE AND 2ND TOE AMPUTATION  . TUBAL LIGATION  1970's     reports that she quit smoking about 20 years ago. Her smoking use included cigarettes. She has a 96.00 pack-year smoking history. She has never used smokeless tobacco. She reports that she drank alcohol. She reports that she does not use drugs.  Allergies  Allergen Reactions  . Codeine Nausea And Vomiting    Family History  Problem Relation Age of Onset  . Heart attack Mother 15     Prior to Admission medications   Medication Sig Start Date End Date Taking? Authorizing Provider  allopurinol (ZYLOPRIM) 100 MG tablet Take 1 tablet (100 mg total) by mouth 2 (two) times daily. 03/10/18  Yes Pokhrel, Laxman, MD  aspirin EC 81 MG tablet Take 81 mg by mouth daily.   Yes [provider]  carvedilol (COREG) 12.5 MG tablet TAKE 1 TABLET BY MOUTH TWICE A DAY WITH A MEAL Patient taking differently: Take 12.5 mg by mouth 2 (two) times daily with a meal.  03/01/18  Yes [place], [name], MD  cholecalciferol (VITAMIN D) 1000 units tablet Take 1,000 Units by mouth daily.   Yes [provider]  clopidogrel (PLAVIX) 75 MG tablet TAKE 1 TABLET BY MOUTH DAILY WITH BREAKFAST. Patient taking differently:  Take 75 mg by mouth daily.  11/23/17  Yes Larey Dresser, MD  colchicine 0.6 MG tablet Take 1 tablet (0.6 mg total) by mouth daily. 03/10/18  Yes Pokhrel, Laxman, MD  escitalopram (LEXAPRO) 20 MG tablet Take 1 tablet (20 mg total) by mouth daily. 10/26/17  Yes [place], [name], MD  ferrous sulfate 325 (65 FE) MG tablet Take 325 mg by mouth daily with breakfast. Reported on 08/21/2015   Yes [provider]  Insulin Human (INSULIN PUMP) SOLN Inject into the skin.   Yes [provider]  insulin NPH Human (HUMULIN N,NOVOLIN N) 100 UNIT/ML injection Inject 15 Units into the skin every morning.   Yes [provider]  insulin regular (NOVOLIN R,HUMULIN R) 100 units/mL injection Inject 5-15 Units into the skin See admin instructions. Sliding scale 80-150 5 units, 150-200 7 units, 201-250 10 units, 251-300 12 units, 301-400 15 units.   Yes [provider]  isosorbide dinitrate (ISORDIL) 10 MG tablet TAKE 1 TABLET BY MOUTH TWICE A DAY Patient taking differently: Take 10 mg by mouth 2 (two) times daily.  08/17/17  Yes Bensimhon, Shaune Pascal, MD  levothyroxine (SYNTHROID, LEVOTHROID) 50 MCG tablet Take 1 tablet (50 mcg total) by mouth daily before breakfast. 11/07/16  Yes Dena Billet B, PA-C  losartan (COZAAR) 50 MG tablet Take 1 tablet (50 mg total) by mouth daily. 02/12/18  Yes [place], [name], MD  Magnesium Oxide 400 (240 Mg) MG TABS TAKE 1 TABLET BY MOUTH EVERY DAY Patient taking differently: TAKE 240 mg TABLET BY MOUTH EVERY DAY 06/23/17  Yes [name], MD  metolazone (ZAROXOLYN) 2.5 MG tablet TAKE 1 TABLET BY MOUTH ONCE A WEEK. 02/22/18  Yes Bensimhon, Shaune Pascal, MD  nitroGLYCERIN (NITROSTAT) 0.4 MG SL tablet PLACE 1 TABLET (0.4 MG TOTAL) UNDER THE TONGUE EVERY 5 (FIVE) MINUTES AS NEEDED FOR CHEST PAIN. 08/28/16  Yes Dena Billet B, PA-C  pregabalin (LYRICA) 150 MG capsule Take 1 capsule (150 mg total) by mouth 2 (two) times daily. 11/12/16  Yes [place], [name], MD    rosuvastatin (CRESTOR) 20 MG tablet TAKE 1 TABLET BY MOUTH EVERY DAY Patient taking differently: Take 20 mg by mouth daily.  12/30/17  Yes [place], [name], MD  torsemide (DEMADEX) 20 MG tablet Take 80 mg by mouth 2 (two) times daily.   Yes [provider]  vitamin E (VITAMIN E) 1000 UNIT capsule Take 1,000 Units by mouth daily.   Yes [provider]  HYDROcodone-acetaminophen (NORCO/VICODIN) 5-325 MG tablet Take 1 tablet by mouth every 4 (four) hours as needed for moderate pain. Patient not taking: Reported on 03/18/2018 02/25/18   Newt Minion, MD  metolazone (ZAROXOLYN) 5 MG tablet Take 1 tablet (5 mg total) by mouth once a week. Patient not taking: Reported on 03/18/2018 09/25/17   Alycia Rossetti, MD  ondansetron (ZOFRAN) 4 MG tablet Take 1 tablet (4 mg total) by mouth every 8 (eight) hours as needed for nausea or vomiting. Patient not taking: Reported on 03/18/2018 01/13/18   Alycia Rossetti, MD  [facility] VERIO test strip  11/02/17   [provider]  promethazine (PHENERGAN) 12.5 MG tablet Take 1 tablet (12.5 mg total) by mouth every 6 (six) hours as needed for nausea or vomiting. Patient not taking: Reported on 03/18/2018 03/02/18   Milas Gain, PA-C    Physical Exam: Vitals:   03/18/18 2030 03/18/18 2120 03/18/18 2200 03/18/18 2215  BP: 135/64 (!) 144/64 137/70 140/74  Pulse: 60 61 63 64  Resp: 13 16 11  (!) 21  Temp:      TempSrc:      SpO2: 99% 100% 100% 97%      Constitutional: NAD, calm, comfortable Vitals:   03/18/18 2030 03/18/18 2120 03/18/18 2200 03/18/18 2215  BP: 135/64 (!) 144/64 137/70 140/74  Pulse: 60 61 63 64  Resp: 13 16 11  (!) 21  Temp:      TempSrc:      SpO2: 99% 100% 100% 97%   Eyes: PERRL, lids and conjunctivae normal ENMT: Mucous membranes are moist. Posterior pharynx clear of any exudate or lesions.Normal dentition.  Neck: normal, supple, no masses, no thyromegaly Respiratory: clear to  auscultation  bilaterally, no wheezing, no crackles. Normal respiratory effort. No accessory muscle use.  Cardiovascular: Regular rate and rhythm, no murmurs / rubs / gallops. No extremity edema. 2+ pedal pulses. No carotid bruits.  Abdomen: no tenderness, no masses palpated. No hepatosplenomegaly. Bowel sounds positive.  Musculoskeletal: no clubbing / cyanosis.  Amputated right 4 toes, good ROM, no contractures. Normal muscle tone.  Skin: no rashes, lesions, ulcers. No induration Neurologic: CN 2-12 grossly intact. Sensation intact, DTR normal. Strength 5/5 in all 4.  Psychiatric: Normal judgment and insight. Alert and oriented x 3. Normal mood.     Labs on Admission: I have personally reviewed following labs and imaging studies  CBC: Recent Labs  Lab 03/18/18 1919  WBC 9.0  NEUTROABS 6.8  HGB 12.6  HCT 41.8  MCV 74.2*  PLT 025   Basic Metabolic Panel: Recent Labs  Lab 03/18/18 1919  NA 132*  K 3.9  CL 88*  CO2 28  GLUCOSE 199*  BUN 94*  CREATININE 3.33*  CALCIUM 9.8   GFR: Estimated Creatinine Clearance: 21.9 mL/min (A) (by C-G formula based on SCr of 3.33 mg/dL (H)). Liver Function Tests: Recent Labs  Lab 03/18/18 1919  AST 17  ALT 17  ALKPHOS 92  BILITOT 0.9  PROT 7.3  ALBUMIN 3.8   No results for input(s): LIPASE, AMYLASE in the last 168 hours. No results for input(s): AMMONIA in the last 168 hours. Coagulation Profile: No results for input(s): INR, PROTIME in the last 168 hours. Cardiac Enzymes: Recent Labs  Lab 03/18/18 1919  TROPONINI 0.03*   BNP (last 3 results) No results for input(s): PROBNP in the last 8760 hours. HbA1C: No results for input(s): HGBA1C in the last 72 hours. CBG: Recent Labs  Lab 03/18/18 1917  GLUCAP 188*   Lipid Profile: No results for input(s): CHOL, HDL, LDLCALC, TRIG, CHOLHDL, LDLDIRECT in the last 72 hours. Thyroid Function Tests: No results for input(s): TSH, T4TOTAL, FREET4, T3FREE, THYROIDAB in the last 72 hours. Anemia  Panel: No results for input(s): VITAMINB12, FOLATE, FERRITIN, TIBC, IRON, RETICCTPCT in the last 72 hours. Urine analysis:    Component Value Date/Time   COLORURINE STRAW (A) 03/18/2018 1839   APPEARANCEUR CLEAR 03/18/2018 1839   LABSPEC 1.008 03/18/2018 1839   PHURINE 5.0 03/18/2018 1839   GLUCOSEU 150 (A) 03/18/2018 1839   HGBUR SMALL (A) 03/18/2018 [place] 03/18/2018 1839   KETONESUR NEGATIVE 03/18/2018 1839   PROTEINUR 100 (A) 03/18/2018 1839   UROBILINOGEN 0.2 04/01/2014 1659   NITRITE NEGATIVE 03/18/2018 1839   LEUKOCYTESUR SMALL (A) 03/18/2018 1839   Sepsis Labs: @LABRCNTIP (procalcitonin:4,lacticidven:4) )No results found for this or any previous visit (from the past 240 hour(s)).   Radiological Exams on Admission: Dg Chest 2 View  Result Date: 03/18/2018 CLINICAL DATA:  Weakness EXAM: CHEST - 2 VIEW COMPARISON:  03/10/2018 FINDINGS: Cardiomegaly. No confluent airspace opacities or effusions. No acute bony abnormality. IMPRESSION: Cardiomegaly.  No active disease. Electronically Signed   By: Rolm Baptise M.D.   On: 03/18/2018 22:03   Dg Foot Complete Right  Result Date: 03/18/2018 CLINICAL DATA:  67 y/o F; amputation 3 weeks ago. Altered mental status. EXAM: RIGHT FOOT COMPLETE - 3+ VIEW COMPARISON:  02/13/2018 right foot radiograph. FINDINGS: Amputation of the fourth and fifth digits across the metatarsophalangeal joints. Bones are demineralized. No acute fracture, dislocation, or erosive bony lesion identified. Large plantar calcaneal enthesophyte. Plantar aponeurosis calcification. Intertarsal osteoarthrosis with multiple osteophytes. IMPRESSION: No acute osseous abnormality  identified. Interval amputation of the fourth and fifth digits. Electronically Signed   By: Kristine Garbe M.D.   On: 03/18/2018 20:03    EKG: Independently reviewed.  It shows normal sinus rhythm with no specific ST changes.  Assessment/Plan Principal Problem:    Syncope Active Problems:   Hyperlipemia   Essential hypertension   CAD (coronary artery disease)   Hypotension   Diabetes mellitus type II, uncontrolled ([place])   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 mellitus (HCC)   PVD (peripheral vascular disease) ([place])     #1 syncope: Most likely secondary to hypo-tension and hypovolemia.  Patient is on 5 different cardiovascular medications.  We will hold all of them including her diuretics.  Hydrate her with some saline.  EF was 55 to 60% previously.  Monitor on telemetry and get PT and OT consultation.  Blood pressure is already improving.  Restart medications slowly tomorrow.  Last echo was about a month ago.  #2 acute on chronic kidney disease: Patient has stage III-IV.  Last creatinine was 1.76 a week ago and now 3.33 so is acute worsening.  Hydrate the patient and monitor renal function.  #3 hyperlipidemia: continue with statin.  #4 coronary artery disease: Mild elevation of troponin most likely due to kidney disease.  Cycle enzymes and monitor.  No EKG change.  #5 diabetes: Continue with insulin pump and sliding scale insulin.  #6 peripheral vascular disease: Appears stable.  #7 lactic acidosis: Most likely secondary to dehydration.  Hydrate patient and monitor closely.     DVT prophylaxis: Heparin Code Status: Full code Family Communication: Sister and son at bedside Disposition Plan: Probably home Consults called: None Admission status: Observation  Severity of Illness: The appropriate patient status for this patient is OBSERVATION. Observation status is judged to be reasonable and necessary in order to provide the required intensity of service to ensure the patient's safety. The patient's presenting symptoms, physical exam findings, and initial radiographic and laboratory data in the context of their medical condition is felt to place them at decreased risk for further clinical deterioration. Furthermore, it is anticipated that the  patient will be medically stable for discharge from the hospital within 2 midnights of admission. The following factors support the patient status of observation.   " The patient's presenting symptoms include syncope. " The physical exam findings include generalized weakness and confusion. " The initial radiographic and laboratory data are creatinine of 3.33 and lactic acidosis.     Barbette Merino MD [facility] Pager [phone]  If 7PM-7AM, please contact night-coverage [email or web address] Password [name]  03/18/2018, 10:34 PM

## 2018-03-18 NOTE — ED Notes (Signed)
Patient transported to X-ray 

## 2018-03-19 ENCOUNTER — Observation Stay (HOSPITAL_COMMUNITY): Payer: PPO

## 2018-03-19 ENCOUNTER — Other Ambulatory Visit: Payer: Self-pay | Admitting: Pharmacist

## 2018-03-19 ENCOUNTER — Ambulatory Visit: Payer: PPO | Admitting: Pharmacist

## 2018-03-19 DIAGNOSIS — E876 Hypokalemia: Secondary | ICD-10-CM | POA: Diagnosis not present

## 2018-03-19 DIAGNOSIS — E1122 Type 2 diabetes mellitus with diabetic chronic kidney disease: Secondary | ICD-10-CM

## 2018-03-19 DIAGNOSIS — I5030 Unspecified diastolic (congestive) heart failure: Secondary | ICD-10-CM

## 2018-03-19 DIAGNOSIS — Z9641 Presence of insulin pump (external) (internal): Secondary | ICD-10-CM | POA: Diagnosis present

## 2018-03-19 DIAGNOSIS — I5032 Chronic diastolic (congestive) heart failure: Secondary | ICD-10-CM | POA: Diagnosis present

## 2018-03-19 DIAGNOSIS — E872 Acidosis: Secondary | ICD-10-CM | POA: Diagnosis present

## 2018-03-19 DIAGNOSIS — R7989 Other specified abnormal findings of blood chemistry: Secondary | ICD-10-CM

## 2018-03-19 DIAGNOSIS — E785 Hyperlipidemia, unspecified: Secondary | ICD-10-CM | POA: Diagnosis present

## 2018-03-19 DIAGNOSIS — E782 Mixed hyperlipidemia: Secondary | ICD-10-CM

## 2018-03-19 DIAGNOSIS — Z87891 Personal history of nicotine dependence: Secondary | ICD-10-CM | POA: Diagnosis not present

## 2018-03-19 DIAGNOSIS — I272 Pulmonary hypertension, unspecified: Secondary | ICD-10-CM | POA: Diagnosis present

## 2018-03-19 DIAGNOSIS — F329 Major depressive disorder, single episode, unspecified: Secondary | ICD-10-CM | POA: Diagnosis present

## 2018-03-19 DIAGNOSIS — E1165 Type 2 diabetes mellitus with hyperglycemia: Secondary | ICD-10-CM | POA: Diagnosis not present

## 2018-03-19 DIAGNOSIS — N184 Chronic kidney disease, stage 4 (severe): Secondary | ICD-10-CM

## 2018-03-19 DIAGNOSIS — E1151 Type 2 diabetes mellitus with diabetic peripheral angiopathy without gangrene: Secondary | ICD-10-CM | POA: Diagnosis present

## 2018-03-19 DIAGNOSIS — E039 Hypothyroidism, unspecified: Secondary | ICD-10-CM | POA: Diagnosis present

## 2018-03-19 DIAGNOSIS — M79671 Pain in right foot: Secondary | ICD-10-CM | POA: Diagnosis present

## 2018-03-19 DIAGNOSIS — I951 Orthostatic hypotension: Secondary | ICD-10-CM | POA: Diagnosis present

## 2018-03-19 DIAGNOSIS — R55 Syncope and collapse: Secondary | ICD-10-CM | POA: Diagnosis not present

## 2018-03-19 DIAGNOSIS — L8990 Pressure ulcer of unspecified site, unspecified stage: Secondary | ICD-10-CM

## 2018-03-19 DIAGNOSIS — N179 Acute kidney failure, unspecified: Secondary | ICD-10-CM | POA: Diagnosis present

## 2018-03-19 DIAGNOSIS — E861 Hypovolemia: Secondary | ICD-10-CM | POA: Diagnosis present

## 2018-03-19 DIAGNOSIS — E860 Dehydration: Secondary | ICD-10-CM | POA: Diagnosis present

## 2018-03-19 DIAGNOSIS — I252 Old myocardial infarction: Secondary | ICD-10-CM | POA: Diagnosis not present

## 2018-03-19 DIAGNOSIS — Z955 Presence of coronary angioplasty implant and graft: Secondary | ICD-10-CM | POA: Diagnosis not present

## 2018-03-19 DIAGNOSIS — Z7902 Long term (current) use of antithrombotics/antiplatelets: Secondary | ICD-10-CM | POA: Diagnosis not present

## 2018-03-19 DIAGNOSIS — I130 Hypertensive heart and chronic kidney disease with heart failure and stage 1 through stage 4 chronic kidney disease, or unspecified chronic kidney disease: Secondary | ICD-10-CM | POA: Diagnosis present

## 2018-03-19 DIAGNOSIS — Z89421 Acquired absence of other right toe(s): Secondary | ICD-10-CM | POA: Diagnosis not present

## 2018-03-19 DIAGNOSIS — I25119 Atherosclerotic heart disease of native coronary artery with unspecified angina pectoris: Secondary | ICD-10-CM | POA: Diagnosis present

## 2018-03-19 DIAGNOSIS — Z7982 Long term (current) use of aspirin: Secondary | ICD-10-CM | POA: Diagnosis not present

## 2018-03-19 LAB — CBC
HCT: 39.8 % (ref 36.0–46.0)
HCT: 36.7 % (ref 36.0–46.0)
Hemoglobin: 12 g/dL (ref 12.0–15.0)
Hemoglobin: 11.3 g/dL — ABNORMAL LOW (ref 12.0–15.0)
MCH: 22.4 pg — ABNORMAL LOW (ref 26.0–34.0)
MCH: 22.9 pg — ABNORMAL LOW (ref 26.0–34.0)
MCHC: 30.2 g/dL (ref 30.0–36.0)
MCHC: 30.8 g/dL (ref 30.0–36.0)
MCV: 74.4 fL — ABNORMAL LOW (ref 80.0–100.0)
MCV: 74.3 fL — ABNORMAL LOW (ref 80.0–100.0)
Platelets: 197 10*3/uL (ref 150–400)
Platelets: 197 10*3/uL (ref 150–400)
RBC: 5.35 MIL/uL — ABNORMAL HIGH (ref 3.87–5.11)
RBC: 4.94 MIL/uL (ref 3.87–5.11)
RDW: 16.3 % — ABNORMAL HIGH (ref 11.5–15.5)
RDW: 16.3 % — ABNORMAL HIGH (ref 11.5–15.5)
WBC: 7.9 10*3/uL (ref 4.0–10.5)
WBC: 6.2 10*3/uL (ref 4.0–10.5)
nRBC: 0 % (ref 0.0–0.2)
nRBC: 0 % (ref 0.0–0.2)

## 2018-03-19 LAB — COMPREHENSIVE METABOLIC PANEL WITH GFR
ALT: 15 U/L (ref 0–44)
AST: 14 U/L — ABNORMAL LOW (ref 15–41)
Albumin: 3 g/dL — ABNORMAL LOW (ref 3.5–5.0)
Alkaline Phosphatase: 80 U/L (ref 38–126)
Anion gap: 16 — ABNORMAL HIGH (ref 5–15)
BUN: 90 mg/dL — ABNORMAL HIGH (ref 8–23)
CO2: 28 mmol/L (ref 22–32)
Calcium: 9.3 mg/dL (ref 8.9–10.3)
Chloride: 95 mmol/L — ABNORMAL LOW (ref 98–111)
Creatinine, Ser: 2.97 mg/dL — ABNORMAL HIGH (ref 0.44–1.00)
GFR calc Af Amer: 18 mL/min — ABNORMAL LOW
GFR calc non Af Amer: 15 mL/min — ABNORMAL LOW
Glucose, Bld: 132 mg/dL — ABNORMAL HIGH (ref 70–99)
Potassium: 3.3 mmol/L — ABNORMAL LOW (ref 3.5–5.1)
Sodium: 139 mmol/L (ref 135–145)
Total Bilirubin: 0.4 mg/dL (ref 0.3–1.2)
Total Protein: 6.2 g/dL — ABNORMAL LOW (ref 6.5–8.1)

## 2018-03-19 LAB — TROPONIN I
Troponin I: <0.03 ng/mL (ref <0.03)
Troponin I: 0.03 ng/mL (ref <0.03)

## 2018-03-19 LAB — GLUCOSE, CAPILLARY
Glucose-Capillary: 133 mg/dL — ABNORMAL HIGH (ref 70–99)
Glucose-Capillary: 178 mg/dL — ABNORMAL HIGH (ref 70–99)
Glucose-Capillary: 261 mg/dL — ABNORMAL HIGH (ref 70–99)
Glucose-Capillary: 446 mg/dL — ABNORMAL HIGH (ref 70–99)
Glucose-Capillary: 478 mg/dL — ABNORMAL HIGH (ref 70–99)

## 2018-03-19 LAB — CREATININE, SERUM
Creatinine, Ser: 2.97 mg/dL — ABNORMAL HIGH (ref 0.44–1.00)
GFR calc Af Amer: 18 mL/min — ABNORMAL LOW
GFR calc non Af Amer: 15 mL/min — ABNORMAL LOW

## 2018-03-19 LAB — SODIUM, URINE, RANDOM: Sodium, Ur: 55 mmol/L

## 2018-03-19 LAB — LACTIC ACID, PLASMA: Lactic Acid, Venous: 1.7 mmol/L (ref 0.5–1.9)

## 2018-03-19 LAB — ECHOCARDIOGRAM COMPLETE
Height: 66 in
Weight: 4130.54 oz

## 2018-03-19 LAB — CREATININE, URINE, RANDOM: Creatinine, Urine: 77.97 mg/dL

## 2018-03-19 MED ORDER — ACETAMINOPHEN 325 MG PO TABS
650.0000 mg | ORAL_TABLET | ORAL | Status: DC | PRN
  Administered 2018-03-19: 650 mg via ORAL
  Filled 2018-03-19: qty 2

## 2018-03-19 MED ORDER — ONDANSETRON HCL 4 MG/2ML IJ SOLN: 4.0000 mg | Freq: Four times a day (QID) | INTRAMUSCULAR | Status: DC | PRN

## 2018-03-19 MED ORDER — FERROUS SULFATE 325 (65 FE) MG PO TABS
325.0000 mg | ORAL_TABLET | Freq: Every day | ORAL | Status: DC
  Administered 2018-03-19 – 2018-03-21 (×3): 325 mg via ORAL
  Filled 2018-03-19 (×3): qty 1

## 2018-03-19 MED ORDER — PREGABALIN 75 MG PO CAPS
150.0000 mg | ORAL_CAPSULE | Freq: Two times a day (BID) | ORAL | Status: DC
  Administered 2018-03-19 (×2): 150 mg via ORAL
  Filled 2018-03-19 (×2): qty 2

## 2018-03-19 MED ORDER — COLCHICINE 0.6 MG PO TABS
0.6000 mg | ORAL_TABLET | Freq: Every day | ORAL | Status: DC
  Administered 2018-03-19: 0.6 mg via ORAL
  Filled 2018-03-19: qty 1

## 2018-03-19 MED ORDER — NITROGLYCERIN 0.4 MG SL SUBL: 0.4000 mg | SUBLINGUAL_TABLET | SUBLINGUAL | Status: DC | PRN

## 2018-03-19 MED ORDER — ROSUVASTATIN CALCIUM 20 MG PO TABS
20.0000 mg | ORAL_TABLET | Freq: Every day | ORAL | Status: DC
  Administered 2018-03-19 – 2018-03-21 (×3): 20 mg via ORAL
  Filled 2018-03-19 (×3): qty 1

## 2018-03-19 MED ORDER — POTASSIUM CHLORIDE CRYS ER 20 MEQ PO TBCR
40.0000 meq | EXTENDED_RELEASE_TABLET | Freq: Once | ORAL | Status: AC
  Administered 2018-03-19: 40 meq via ORAL
  Filled 2018-03-19: qty 2

## 2018-03-19 MED ORDER — HYDROCODONE-ACETAMINOPHEN 5-325 MG PO TABS
1.0000 | ORAL_TABLET | Freq: Four times a day (QID) | ORAL | Status: DC | PRN
  Administered 2018-03-20: 1 via ORAL
  Filled 2018-03-19: qty 1

## 2018-03-19 MED ORDER — TRAMADOL HCL 50 MG PO TABS
50.0000 mg | ORAL_TABLET | Freq: Four times a day (QID) | ORAL | Status: DC | PRN
  Administered 2018-03-19: 50 mg via ORAL
  Filled 2018-03-19: qty 1

## 2018-03-19 MED ORDER — ONDANSETRON HCL 4 MG PO TABS: 4.0000 mg | ORAL_TABLET | Freq: Four times a day (QID) | ORAL | Status: DC | PRN

## 2018-03-19 MED ORDER — CLOPIDOGREL BISULFATE 75 MG PO TABS
75.0000 mg | ORAL_TABLET | Freq: Every day | ORAL | Status: DC
  Administered 2018-03-19 – 2018-03-21 (×3): 75 mg via ORAL
  Filled 2018-03-19 (×3): qty 1

## 2018-03-19 MED ORDER — INSULIN ASPART 100 UNIT/ML ~~LOC~~ SOLN
0.0000 [IU] | Freq: Three times a day (TID) | SUBCUTANEOUS | Status: DC
  Administered 2018-03-19: 2 [IU] via SUBCUTANEOUS
  Administered 2018-03-19: 1 [IU] via SUBCUTANEOUS

## 2018-03-19 MED ORDER — VITAMIN E 180 MG (400 UNIT) PO CAPS
800.0000 [IU] | ORAL_CAPSULE | Freq: Every day | ORAL | Status: DC
  Administered 2018-03-19 – 2018-03-21 (×3): 800 [IU] via ORAL
  Filled 2018-03-19 (×4): qty 2

## 2018-03-19 MED ORDER — ALLOPURINOL 100 MG PO TABS
100.0000 mg | ORAL_TABLET | Freq: Two times a day (BID) | ORAL | Status: DC
  Administered 2018-03-19 – 2018-03-21 (×6): 100 mg via ORAL
  Filled 2018-03-19 (×6): qty 1

## 2018-03-19 MED ORDER — MAGNESIUM OXIDE 400 (241.3 MG) MG PO TABS
400.0000 mg | ORAL_TABLET | Freq: Every day | ORAL | Status: DC
  Administered 2018-03-19 – 2018-03-21 (×3): 400 mg via ORAL
  Filled 2018-03-19 (×3): qty 1

## 2018-03-19 MED ORDER — ASPIRIN EC 81 MG PO TBEC
81.0000 mg | DELAYED_RELEASE_TABLET | Freq: Every day | ORAL | Status: DC
  Administered 2018-03-19 – 2018-03-21 (×3): 81 mg via ORAL
  Filled 2018-03-19 (×3): qty 1

## 2018-03-19 MED ORDER — ESCITALOPRAM OXALATE 10 MG PO TABS
20.0000 mg | ORAL_TABLET | Freq: Every day | ORAL | Status: DC
  Administered 2018-03-19 – 2018-03-21 (×3): 20 mg via ORAL
  Filled 2018-03-19 (×3): qty 2

## 2018-03-19 MED ORDER — SODIUM CHLORIDE 0.9 % IV SOLN
INTRAVENOUS | Status: DC
  Administered 2018-03-19 – 2018-03-21 (×6): via INTRAVENOUS

## 2018-03-19 MED ORDER — INSULIN PUMP
Freq: Three times a day (TID) | SUBCUTANEOUS | Status: DC
  Administered 2018-03-19: 22:00:00 via SUBCUTANEOUS
  Administered 2018-03-19: 15 via SUBCUTANEOUS
  Administered 2018-03-20: 7 via SUBCUTANEOUS
  Administered 2018-03-20: 08:00:00 via SUBCUTANEOUS
  Administered 2018-03-20 (×2): 10 via SUBCUTANEOUS
  Administered 2018-03-21: 08:00:00 via SUBCUTANEOUS
  Administered 2018-03-21: 5 via SUBCUTANEOUS
  Filled 2018-03-19: qty 1

## 2018-03-19 MED ORDER — VITAMIN D 1000 UNITS PO TABS
1000.0000 [IU] | ORAL_TABLET | Freq: Every day | ORAL | Status: DC
  Administered 2018-03-19 – 2018-03-21 (×3): 1000 [IU] via ORAL
  Filled 2018-03-19 (×3): qty 1

## 2018-03-19 MED ORDER — LEVOTHYROXINE SODIUM 50 MCG PO TABS
50.0000 ug | ORAL_TABLET | Freq: Every day | ORAL | Status: DC
  Administered 2018-03-19 – 2018-03-21 (×3): 50 ug via ORAL
  Filled 2018-03-19 (×3): qty 1

## 2018-03-19 MED ORDER — SODIUM CHLORIDE 0.9% FLUSH
3.0000 mL | Freq: Two times a day (BID) | INTRAVENOUS | Status: DC
  Administered 2018-03-19 – 2018-03-20 (×4): 3 mL via INTRAVENOUS

## 2018-03-19 MED ORDER — INSULIN ASPART 100 UNIT/ML ~~LOC~~ SOLN
0.0000 [IU] | Freq: Every day | SUBCUTANEOUS | Status: DC
  Administered 2018-03-19: 3 [IU] via SUBCUTANEOUS

## 2018-03-19 MED ORDER — HYDROCODONE-ACETAMINOPHEN 5-325 MG PO TABS
1.0000 | ORAL_TABLET | Freq: Four times a day (QID) | ORAL | Status: DC | PRN
  Administered 2018-03-19: 1 via ORAL
  Filled 2018-03-19 (×2): qty 1

## 2018-03-19 MED ORDER — PERFLUTREN LIPID MICROSPHERE
1.0000 mL | INTRAVENOUS | Status: AC | PRN
  Administered 2018-03-19: 4 mL via INTRAVENOUS
  Filled 2018-03-19: qty 10

## 2018-03-19 MED ORDER — HEPARIN SODIUM (PORCINE) 5000 UNIT/ML IJ SOLN
5000.0000 [IU] | Freq: Three times a day (TID) | INTRAMUSCULAR | Status: DC
  Administered 2018-03-19 – 2018-03-21 (×6): 5000 [IU] via SUBCUTANEOUS
  Filled 2018-03-19 (×6): qty 1

## 2018-03-19 NOTE — Evaluation (Signed)
Occupational Therapy Evaluation Patient Details Name: Susan [name] MRN: [id number] DOB: [birth year] Today's Date: 03/19/2018    History of Present Illness Susan [name] is an 67 y.o. 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 Sharol Given (TWB R foot with postop shoe), started noticing increasing swelling and pain in the left hand unable to flex the fingers.  Since patient's symptoms progressed patient came to the ER.   Clinical Impression   Pt admitted with above and presents to OT with deficits impacting ability to complete ADLs at [facility].  Pt agreeable to therapy session, completing sit > stand at EOB. Pt required mod assist for sit > stand due to instability in standing and difficulty maintaining WB precautions in standing.  Pt very reliant on BUE in standing, unable to release to complete any LB hygiene/dressing tasks.  Pt reports understanding of recommendation for continued therapy services to increase strength and stability and decrease burden of care with ADLs prior to d/c home but pt reporting desire to d/c home this weekend.    Follow Up Recommendations  SNF(Pt reports she wants to go home)    Equipment Recommendations  None recommended by OT(should have all from previous hospitalizations)       Precautions / Restrictions Precautions Precautions: Fall Precaution Comments: Difficulty maintaining TWB R foot with any type of standing/stand pivot Required Braces or Orthoses: Other Brace/Splint Other Brace/Splint: post op shoe R foot Restrictions Weight Bearing Restrictions: Yes RLE Weight Bearing: Touchdown weight bearing Other Position/Activity Restrictions: WB was obtained from previous admission, pt reports she has 2 more weeks      Mobility Bed Mobility Overal bed mobility: Needs  Assistance Bed Mobility: Supine to Sit;Sit to Supine     Supine to sit: Min assist Sit to supine: Min assist   General bed mobility comments: light assist to trunk to sit up and to legs back to bed  Transfers Overall transfer level: Needs assistance Equipment used: Rolling walker (2 wheeled) Transfers: Sit to/from Stand;Lateral/Scoot Transfers Sit to Stand: Mod assist(instability in standing and difficulty maintaining WB precautions)        Lateral/Scoot Transfers: Supervision General transfer comment: scooting up side of bed to practice bed to chair    Balance     Sitting balance-Leahy Scale: Good     Standing balance support: Bilateral upper extremity supported Standing balance-Leahy Scale: Poor Standing balance comment: assist for balance with UE support, mod A for gaining balance once standing,                            ADL either performed or assessed with clinical judgement   ADL Overall ADL's : Needs assistance/impaired Eating/Feeding: Set up   Grooming: Set up;Sitting   Upper Body Bathing: Minimal assistance   Lower Body Bathing: Moderate assistance;Sitting/lateral leans;Sit to/from stand   Upper Body Dressing : Set up;Sitting   Lower Body Dressing: Maximal assistance;Bed level   Toilet Transfer: Moderate assistance;Squat-pivot             General ADL Comments: limited access to LB for ADL due to obesity and instability in sitting.  Pt reports she used a reacher PTA and that her son woul assist with socks and shoes/post op shoes     Vision Baseline Vision/History: No visual deficits Patient Visual  Report: No change from baseline              Pertinent Vitals/Pain Pain Assessment: Faces Faces Pain Scale: Hurts a little bit Pain Location: R foot Pain Descriptors / Indicators: Sore Pain Intervention(s): Monitored during session;Repositioned;Limited activity within patient's tolerance     Hand Dominance Right   Extremity/Trunk  Assessment Upper Extremity Assessment Upper Extremity Assessment: Generalized weakness;RUE deficits/detail;LUE deficits/detail RUE Deficits / Details: limited shoulder flexion to gorssly 80* secondary to reports of rotator cuff injury RUE Sensation: WNL RUE Coordination: decreased fine motor LUE Deficits / Details: decreased grasp due to gout LUE Sensation: WNL LUE Coordination: decreased fine motor   Lower Extremity Assessment Lower Extremity Assessment: Generalized weakness RLE Deficits / Details: 4- strength but ankle NT LLE Deficits / Details: 4- strength   Cervical / Trunk Assessment Cervical / Trunk Assessment: Kyphotic   Communication Communication Communication: No difficulties   Cognition Arousal/Alertness: Awake/alert Behavior During Therapy: WFL for tasks assessed/performed Overall Cognitive Status: Within Functional Limits for tasks assessed                                                Home Living Family/patient expects to be discharged to:: Private residence Living Arrangements: Children;Spouse/significant other Available Help at Discharge: Available PRN/intermittently;Family(pt reports son is always there) Type of Home: House Home Access: Ramped entrance     Home Layout: One level     Bathroom Shower/Tub: Occupational psychologist: Standard     Home Equipment: Grab bars - tub/shower;Walker - 2 wheels;Walker - 4 wheels;Cane - single point;Wheelchair - manual   Additional Comments: wc from bed transfers       Prior Functioning/Environment Level of Independence: Needs assistance  Gait / Transfers Assistance Needed: reports her son would walk behind her when she would ambulate with RW ADL's / Homemaking Assistance Needed: was able to perform self care prior to recent 10/3 admission            OT Problem List: Decreased strength;Decreased range of motion;Decreased activity tolerance;Impaired balance (sitting and/or  standing);Decreased safety awareness;Decreased knowledge of use of DME or AE;Decreased knowledge of precautions;Obesity;Impaired UE functional use;Pain      OT Treatment/Interventions: Self-care/ADL training;Therapeutic exercise;Therapeutic activities;Patient/family education;Balance training;DME and/or AE instruction    OT Goals(Current goals can be found in the care plan section) Acute Rehab OT Goals Patient Stated Goal: to get stronger and go home OT Goal Formulation: With patient Time For Goal Achievement: 04/02/18 Potential to Achieve Goals: Good  OT Frequency: Min 2X/week    AM-PAC PT "6 Clicks" Daily Activity     Outcome Measure Help from another person eating meals?: None Help from another person taking care of personal grooming?: A Little Help from another person toileting, which includes using toliet, bedpan, or urinal?: A Lot Help from another person bathing (including washing, rinsing, drying)?: A Lot Help from another person to put on and taking off regular upper body clothing?: A Little Help from another person to put on and taking off regular lower body clothing?: A Lot 6 Click Score: 16   End of Session Equipment Utilized During Treatment: Gait belt;Rolling walker Nurse Communication: Mobility status  Activity Tolerance: Patient tolerated treatment well Patient left: in bed;with call bell/phone within reach;with bed alarm set  OT Visit Diagnosis: Other abnormalities of gait and mobility (R26.89);Repeated falls (R29.6);History of  falling (Z91.81);Muscle weakness (generalized) (M62.81);Pain Pain - Right/Left: Right Pain - part of body: Ankle and joints of foot                Time: 9147-8295 OT Time Calculation (min): 25 min Charges:  OT General Charges $OT Visit: 1 Visit OT Evaluation $OT Eval Moderate Complexity: 1 Mod OT Treatments $Self Care/Home Management : 8-22 mins   Simonne Come, [phone] 03/19/2018, 10:56 AM

## 2018-03-19 NOTE — Progress Notes (Signed)
  Echocardiogram 2D Echocardiogram has been performed.  Susan [name] 03/19/2018, 12:01 PM

## 2018-03-19 NOTE — Progress Notes (Signed)
Inpatient Diabetes Program Recommendations  AACE/ADA: New Consensus Statement on Inpatient Glycemic Control (2019)  Target Ranges:  Prepandial:   less than 140 mg/dL      Peak postprandial:   less than 180 mg/dL (1-2 hours)      Critically ill patients:  140 - 180 mg/dL   Results for Susan [name], Susan [name] (MRN [id number]) as of 03/19/2018 13:04  Ref. Range 03/18/2018 19:17 03/19/2018 00:36 03/19/2018 06:17 03/19/2018 11:36  Glucose-Capillary Latest Ref Range: 70 - 99 mg/dL 188 (H) 261 (H) 133 (H) 178 (H)    Review of Glycemic Control  Diabetes history: DM2 Outpatient Diabetes medications: OmniPod insulin pump with Regular insulin, NPH 15 units QAM if CBG >200 mg/dl, Regular 10 units TID with meals Current orders for Inpatient glycemic control: Insulin Pump ACHS&2am, Novolog 0-9 units TID with meals, Novolog 0-5 units QHS  Inpatient Diabetes Program Recommendations:  Insulin Pump: Patient is using an OmniPod insulin pump with Regular insulin which is delivering 1.4 units/hr (total of 33.6 units/24 hours). Novolog-Correction: Ordered insulin pump along with Novolog SQ . Agree with continuing correction scale since patient does not have specific insulin sensitivity in her insulin pump and she has been taking injections of regular insulin at home along with insulin pump use.  Spoke with patient regarding diabetes and home regimen for diabetes management.  Patient states that she is followed by Dr. [name] (Endocrinologist) for diabetes management. Patient uses an OmniPod Dash insulin pump with Regular insulin as an outpatient. Patient has insulin pump attached and it is delivering basal insulin. Patient states that she was started on the insulin pump a few months ago and she has only worked with insulin pump trainer 2 times. Patient states that she does not know how to use the pump to enter carbohydrates into her pump but she has 5 different bolus features based on glucose value.   Patient reports that over  the past week her glucose has been reading "HI" on her glucometer and she talked with Dr. [name] on Wednesday this week and she instructed her to take NPH 15 units QAM if CBG >200 mg/dl and Regular 10 units TID with meals. Patient reports that adding the NPH and Regular injections helped glucose but it was still reading 280-300's mg/dl. Inquired about any recent changes or steroid use. Patient reports that she was taking Prednisone and she finished her last pill on 03/17/18.  Patient was not aware that Prednisone was a steroid and that it was going to make her glucose elevated. Discussed Prednisone in more detail and explained impact steroids have on glycemic control.    Current insulin pump settings are as follows:  Basal insulin  1.4 units/hour Total daily basal insulin: 33.6 units/24 hours  Bolus (based on glucose) 80-150 mg/dl  5 units 150-200 mg/dl  7 units 201-250 mg/dl  10 units 251-300 mg/dl  12 units 301-400 mg/dl  15 units  Patient has an extra pod to use when she needs to change it out but she has no insulin here at the hospital. Patient states that her husband will be bringing her regular insulin from home along with her charger for the [employer] device.  Asked patient to let the nursing staff know if she needs insulin to fill her insulin pod. Patient reports that she has an upcoming appointment with Dr. [name] on October 31st. Encouraged patient to ask Dr. [name] about whether she needs setting adjustments with the insulin pump. Also encouraged her to reach out to Dr.  Balan before then if glucose continued to be elevated at home.  Patient verbalized understanding of information discussed and states that he does not have any further questions related to diabetes at this time.  NURSING: IF not already done, please print off the Patient insulin pump contract and flow sheet. The insulin pump contract should be signed by the patient and then placed in the chart. The patient  insulin pump flow sheet will be completed by the patient at the bedside and the RN caring for the patient will use the patient's flow sheet to document in the [facility]. RN will need to complete the Nursing Insulin Pump Flowsheet at least once a shift. Patient will need to keep extra insulin pump supplies at the bedside at all times.   Thanks, Barnie Alderman, RN, MSN, CDE Diabetes Coordinator Inpatient Diabetes Program [phone] (Team Pager from 8am to 5pm)

## 2018-03-19 NOTE — Care Management Note (Signed)
Case Management Note  Patient Details  Name: Suzane Vanderweide MRN: [id number] Date of Birth: [birth year]  Subjective/Objective:      Pt in with syncope. She is from home with spouse and son. Son can provide 24 hour supervision. Pt was active with [facility] for [facility] PT prior to admission. Pt has: wheelchair, walker, shower bench and 3 in 1.  Denies issues obtaining her meds. Family assists with transportation.              Action/Plan: Recommendations are for SNF. Pt refusing she wants to return home with a continuation of [place] services through [facility]. CM following. Will need resumption orders.   Expected Discharge Date:                  Expected Discharge Plan:  [place]  In-House Referral:     Discharge planning Services  CM Consult  Post Acute Care Choice:  Home Health Choice offered to:  Patient  DME Arranged:    DME Agency:  [place]:    [facility] Agency:  [place]  Status of Service:  In process, will continue to follow  If discussed at Long Length of Stay Meetings, dates discussed:    Additional Comments:  Pollie Friar, RN 03/19/2018, 4:28 PM

## 2018-03-19 NOTE — Progress Notes (Signed)
CSW alerted by MD that patient not interested in SNF placement. Please consult CSW should patient become agreeable to SNF.  CSW signing off.  Laveda Abbe, [place] Clinical Social Worker [phone]

## 2018-03-19 NOTE — Progress Notes (Signed)
PROGRESS NOTE    Susan [name]  EPP:[id number] DOB: 09/21/1950 DOA: 03/18/2018 PCP: Alycia Rossetti, MD    Brief Narrative: Susan [name] is a 67 y.o. female with medical history significant of insulin-dependent diabetes on insulin pump, diastolic dysfunction CHF, chronic kidney disease stage III, hypothyroidism, coronary artery disease, peripheral vascular disease status post recent amputation as well as pulmonary hypertension who was just discharged from the hospital about a week ago after admission with hand swelling, Patient was evaluated at the time with a left long finger redness.  IV antibiotics given and discharged home on clindamycin.  she passed out at home and was found to be hypotensive. Patient  admitted with diagnosis of syncopal episode for work-up..   Assessment & Plan:   Principal Problem:   Syncope Active Problems:   Hyperlipemia   Essential hypertension   CAD (coronary artery disease)   Hypotension   Diabetes mellitus type II, uncontrolled ([place])   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 mellitus (HCC)   PVD (peripheral vascular disease) (HCC)  Syncope from orthostatic hypotension and hypovolemia from medications. Holding all the medications at this time. Repeat orthostatics today.  echocardiogram ordered and pending.  Serial troponin's negative.  REPEAT EKG   Hyperlipidemia:  Statin to be resumed in am.    Diabetes mellitus: CBG (last 3)  Recent Labs    03/19/18 0036 03/19/18 0617 03/19/18 1136  GLUCAP 261* 133* 178*   Resume insulin pump and    ACUTE ON Stage 4 CKD:  Possibly pre renal in etiology.  Hydrate and repeat in am.   Hypokalemia: repleted.   Elevated lactic acid:  From dehydration and hypotension:  Resolved.   Hypothyroidism: resume synthroid.    Foot pain;  Tramadol prn.    CAD: No chest pain or sob.  Resume aspirin and plavix.   DVT prophylaxis: heparin sq Code Status: full code.  Family Communication: none at bedside.    Disposition Plan: pending clinical improvement.   Consultants:   None.   Procedures:  None.  Antimicrobials: none.   Subjective: No complaints other than foot pain.   Objective: Vitals:   03/19/18 0502 03/19/18 0504 03/19/18 0834 03/19/18 1149  BP: 117/64 (!) 103/49 (!) 103/54   Pulse: 73 83 69   Resp:   12   Temp:   97.7 F (36.5 C) 97.9 F (36.6 C)  TempSrc:   Oral Oral  SpO2: 94% 96% 100%   Weight:      Height:        Intake/Output Summary (Last 24 hours) at 03/19/2018 1350 Last data filed at 03/18/2018 2115 Gross per 24 hour  Intake 1000 ml  Output -  Net 1000 ml   Filed Weights   03/19/18 0459  Weight: 117.1 kg    Examination:  General exam: Appears calm and comfortable  Respiratory system: Clear to auscultation. Respiratory effort normal. Cardiovascular system: S1 & S2 heard, RRR. No JVD,. No pedal edema. Gastrointestinal system: Abdomen is nondistended, soft and nontender.Normal bowel sounds heard. Central nervous system: Alert and oriented. No focal neurological deficits. Extremities: Symmetric 5 x 5 power. right toe amputation.  Skin: No rashes, lesions or ulcers Psychiatry:  Mood & affect appropriate.     Data Reviewed: I have personally reviewed following labs and imaging studies  CBC: Recent Labs  Lab 03/18/18 1919 03/19/18 0053 03/19/18 0644  WBC 9.0 7.9 6.2  NEUTROABS 6.8  --   --   HGB 12.6 12.0 11.3*  HCT 41.8  39.8 36.7  MCV 74.2* 74.4* 74.3*  PLT 249 197 989   Basic Metabolic Panel: Recent Labs  Lab 03/18/18 1919 03/19/18 0053 03/19/18 0644  NA 132*  --  139  K 3.9  --  3.3*  CL 88*  --  95*  CO2 28  --  28  GLUCOSE 199*  --  132*  BUN 94*  --  90*  CREATININE 3.33* 2.97* 2.97*  CALCIUM 9.8  --  9.3   GFR: Estimated Creatinine Clearance: 23.6 mL/min (A) (by C-G formula based on SCr of 2.97 mg/dL (H)). Liver Function Tests: Recent Labs  Lab 03/18/18 1919 03/19/18 0644  AST 17 14*  ALT 17 15  ALKPHOS 92 80   BILITOT 0.9 0.4  PROT 7.3 6.2*  ALBUMIN 3.8 3.0*   No results for input(s): LIPASE, AMYLASE in the last 168 hours. No results for input(s): AMMONIA in the last 168 hours. Coagulation Profile: No results for input(s): INR, PROTIME in the last 168 hours. Cardiac Enzymes: Recent Labs  Lab 03/18/18 1919 03/19/18 0053 03/19/18 0644  TROPONINI 0.03* <0.03 0.03*   BNP (last 3 results) No results for input(s): PROBNP in the last 8760 hours. HbA1C: No results for input(s): HGBA1C in the last 72 hours. CBG: Recent Labs  Lab 03/18/18 1917 03/19/18 0036 03/19/18 0617 03/19/18 1136  GLUCAP 188* 261* 133* 178*   Lipid Profile: No results for input(s): CHOL, HDL, LDLCALC, TRIG, CHOLHDL, LDLDIRECT in the last 72 hours. Thyroid Function Tests: No results for input(s): TSH, T4TOTAL, FREET4, T3FREE, THYROIDAB in the last 72 hours. Anemia Panel: No results for input(s): VITAMINB12, FOLATE, FERRITIN, TIBC, IRON, RETICCTPCT in the last 72 hours. Sepsis Labs: Recent Labs  Lab 03/18/18 2025 03/19/18 1211  LATICACIDVEN 2.30* 1.7    Recent Results (from the past 240 hour(s))  Blood culture (routine x 2)     Status: None (Preliminary result)   Collection Time: 03/18/18  8:00 PM  Result Value Ref Range Status   Specimen Description BLOOD RIGHT HAND  Final   Special Requests   Final    BOTTLES DRAWN AEROBIC ONLY Blood Culture results may not be optimal due to an inadequate volume of blood received in culture bottles   Culture   Final    NO GROWTH < 12 HOURS Performed at [facility] [address], [place], [place] [address]    Report Status PENDING  Incomplete  Blood culture (routine x 2)     Status: None (Preliminary result)   Collection Time: 03/18/18  8:15 PM  Result Value Ref Range Status   Specimen Description BLOOD LEFT FOREARM  Final   Special Requests   Final    BOTTLES DRAWN AEROBIC AND ANAEROBIC Blood Culture results may not be optimal due to an inadequate volume of  blood received in culture bottles   Culture   Final    NO GROWTH < 12 HOURS Performed at [facility], [place] [address], [place], [place] [address]    Report Status PENDING  Incomplete         Radiology Studies: Dg Chest 2 View  Result Date: 03/18/2018 CLINICAL DATA:  Weakness EXAM: CHEST - 2 VIEW COMPARISON:  03/10/2018 FINDINGS: Cardiomegaly. No confluent airspace opacities or effusions. No acute bony abnormality. IMPRESSION: Cardiomegaly.  No active disease. Electronically Signed   By: Rolm Baptise M.D.   On: 03/18/2018 22:03   Dg Foot Complete Right  Result Date: 03/18/2018 CLINICAL DATA:  67 y/o F;  amputation 3 weeks ago. Altered mental status. EXAM: RIGHT FOOT COMPLETE - 3+ VIEW COMPARISON:  02/13/2018 right foot radiograph. FINDINGS: Amputation of the fourth and fifth digits across the metatarsophalangeal joints. Bones are demineralized. No acute fracture, dislocation, or erosive bony lesion identified. Large plantar calcaneal enthesophyte. Plantar aponeurosis calcification. Intertarsal osteoarthrosis with multiple osteophytes. IMPRESSION: No acute osseous abnormality identified. Interval amputation of the fourth and fifth digits. Electronically Signed   By: Kristine Garbe M.D.   On: 03/18/2018 20:03        Scheduled Meds: . allopurinol  100 mg Oral BID  . aspirin EC  81 mg Oral Daily  . cholecalciferol  1,000 Units Oral Daily  . clopidogrel  75 mg Oral Daily  . escitalopram  20 mg Oral Daily  . ferrous sulfate  325 mg Oral Q breakfast  . heparin  5,000 Units Subcutaneous Q8H  . insulin aspart  0-5 Units Subcutaneous QHS  . insulin aspart  0-9 Units Subcutaneous TID WC  . insulin pump   Subcutaneous TID AC, HS, 0200  . levothyroxine  50 mcg Oral QAC breakfast  . magnesium oxide  400 mg Oral Daily  . rosuvastatin  20 mg Oral Daily  . sodium chloride flush  3 mL Intravenous Q12H  . vitamin E  800 Units Oral Daily   Continuous Infusions: . sodium  chloride 100 mL/hr at 03/19/18 1122     LOS: 0 days    Time spent: 35 minutes.     Hosie Poisson, MD [facility] Pager [phone]  If 7PM-7AM, please contact night-coverage [email or web address] Password [email or web address] 03/19/2018, 1:50 PM

## 2018-03-19 NOTE — Evaluation (Signed)
Physical Therapy Evaluation Patient Details Name: Susan [name] MRN: [id number] DOB: [birth year] Today's Date: 03/19/2018   History of Present Illness  Susan [name] is an 67 y.o. 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 Sharol Given (TWB R foot with postop shoe), started noticing increasing swelling and pain in the left hand unable to flex the fingers.  Since patient's symptoms progressed patient came to the ER.  Clinical Impression  Pt is up to transfer sliding side of bed and declined OOB to chair.  Has recently arrived through ED and will likely be able to transition home but for now is still SNF candidate.  WIll work toward home as she is set up with access of wc and ramp but not clear help for 24/7 with family.  Follow acutely for strengthening, balance and transfer training.    Follow Up Recommendations SNF    Equipment Recommendations  None recommended by PT    Recommendations for Other Services OT consult     Precautions / Restrictions Precautions Precautions: Fall Precaution Comments: Difficulty maintaining TWB R foot with any type of standing/stand pivot Required Braces or Orthoses: Other Brace/Splint Other Brace/Splint: post op shoe R foot Restrictions Weight Bearing Restrictions: Yes RLE Weight Bearing: Touchdown weight bearing Other Position/Activity Restrictions: WB was obtained from previous admission      Mobility  Bed Mobility Overal bed mobility: Needs Assistance Bed Mobility: Supine to Sit;Sit to Supine     Supine to sit: Min assist Sit to supine: Mod assist   General bed mobility comments: light assist to trunk to sit up and to legs back to bed  Transfers Overall transfer level: Needs assistance Equipment used: None Transfers: Lateral/Scoot Transfers          Lateral/Scoot Transfers: Supervision General transfer comment: scooting up side of bed to practice bed to chair  Ambulation/Gait             General Gait Details: unable to attempt  Stairs            Wheelchair Mobility    Modified Rankin (Stroke Patients Only)       Balance     Sitting balance-Leahy Scale: Good                                       Pertinent Vitals/Pain Pain Assessment: Faces Faces Pain Scale: No hurt    Home Living Family/patient expects to be discharged to:: Private residence Living Arrangements: Children;Spouse/significant other Available Help at Discharge: Available PRN/intermittently;Family(but pt reports son is always there) Type of Home: House Home Access: Ramped entrance     Home Layout: One level Home Equipment: Grab bars - tub/shower;Walker - 2 wheels;Walker - 4 wheels;Cane - single point;Wheelchair - manual Additional Comments: wc from bed transfers     Prior Function Level of Independence: Needs assistance      ADL's / Homemaking Assistance Needed: was able to care more for house prior to her recent toe surgeries         Hand Dominance   Dominant Hand: Right    Extremity/Trunk Assessment   Upper Extremity Assessment Upper Extremity Assessment: Overall WFL for tasks assessed    Lower Extremity Assessment Lower Extremity Assessment: Generalized weakness RLE Deficits /  Details: 4- strength but ankle NT LLE Deficits / Details: 4- strength    Cervical / Trunk Assessment Cervical / Trunk Assessment: Kyphotic  Communication   Communication: No difficulties  Cognition Arousal/Alertness: Awake/alert Behavior During Therapy: WFL for tasks assessed/performed Overall Cognitive Status: Within Functional Limits for tasks assessed                                        General Comments      Exercises     Assessment/Plan    PT Assessment    PT Problem List         PT  Treatment Interventions DME instruction;Functional mobility training;Therapeutic activities;Therapeutic exercise;Balance training;Neuromuscular re-education;Patient/family education    PT Goals (Current goals can be found in the Care Plan section)  Acute Rehab PT Goals Patient Stated Goal: to get to the bottom of what is wrong with her PT Goal Formulation: With patient Time For Goal Achievement: 03/26/18 Potential to Achieve Goals: Good    Frequency Min 3X/week   Barriers to discharge        Co-evaluation               AM-PAC PT "6 Clicks" Daily Activity  Outcome Measure Difficulty turning over in bed (including adjusting bedclothes, sheets and blankets)?: None Difficulty moving from lying on back to sitting on the side of the bed? : Unable Difficulty sitting down on and standing up from a chair with arms (e.g., wheelchair, bedside commode, etc,.)?: Unable Help needed moving to and from a bed to chair (including a wheelchair)?: A Little Help needed walking in hospital room?: Total Help needed climbing 3-5 steps with a railing? : Total 6 Click Score: 11    End of Session Equipment Utilized During Treatment: (purwick catheter) Activity Tolerance: Patient tolerated treatment well Patient left: in bed;with call bell/phone within reach;with bed alarm set(pt requested return to bed) Nurse Communication: Mobility status      Time: 7416-3845 PT Time Calculation (min) (ACUTE ONLY): 24 min   Charges:   PT Evaluation $PT Eval Moderate Complexity: 1 Mod PT Treatments $Therapeutic Activity: 8-22 mins       Ramond Dial 03/19/2018, 8:17 AM   Mee Hives, PT MS Acute Rehab Dept. Number: [place] and [place]

## 2018-03-19 NOTE — Progress Notes (Signed)
Arrived from ED at 0000. Alert and oriented. Denies any pain. Husband at bedside. Call light within reach.

## 2018-03-19 NOTE — Progress Notes (Signed)
[facility]  Patient Status: Active (receiving services up to time of hospitalization)  [facility] is providing the following services: PT and OT  If patient discharges after hours, please call [phone].   Janae Sauce 03/19/2018, 10:37 AM

## 2018-03-19 NOTE — Patient Outreach (Signed)
[facility]) Care Management  [place]  03/19/2018  Susan [name] [birth year] [id number]   Reason for referral: medication assistance  Patient currently admitted to the hospital.  Will continue to follow and outreach when discharged home.   Plan:  I will make another outreach attempt to patient within 3-4 business days   Regina Eck, PharmD, [place]  [phone]

## 2018-03-19 NOTE — Progress Notes (Signed)
CRITICAL VALUE STICKER  CRITICAL VALUE: Troponin 0.03  RECEIVER (on-site recipient of call): [place] NOTIFIED:   MESSENGER (representative from lab):  MD NOTIFIED: Dr. Karleen Hampshire  TIME OF NOTIFICATION: 1021  RESPONSE: Txt paged.

## 2018-03-20 LAB — GLUCOSE, CAPILLARY
Glucose-Capillary: 210 mg/dL — ABNORMAL HIGH (ref 70–99)
Glucose-Capillary: 110 mg/dL — ABNORMAL HIGH (ref 70–99)
Glucose-Capillary: 164 mg/dL — ABNORMAL HIGH (ref 70–99)
Glucose-Capillary: 255 mg/dL — ABNORMAL HIGH (ref 70–99)
Glucose-Capillary: 255 mg/dL — ABNORMAL HIGH (ref 70–99)

## 2018-03-20 LAB — CBC
HCT: 37.8 % (ref 36.0–46.0)
Hemoglobin: 11.1 g/dL — ABNORMAL LOW (ref 12.0–15.0)
MCH: 22.4 pg — ABNORMAL LOW (ref 26.0–34.0)
MCHC: 29.4 g/dL — ABNORMAL LOW (ref 30.0–36.0)
MCV: 76.4 fL — ABNORMAL LOW (ref 80.0–100.0)
Platelets: 157 10*3/uL (ref 150–400)
RBC: 4.95 MIL/uL (ref 3.87–5.11)
RDW: 16.6 % — ABNORMAL HIGH (ref 11.5–15.5)
WBC: 5.4 10*3/uL (ref 4.0–10.5)
nRBC: 0 % (ref 0.0–0.2)

## 2018-03-20 LAB — BASIC METABOLIC PANEL
Anion gap: 11 (ref 5–15)
BUN: 79 mg/dL — ABNORMAL HIGH (ref 8–23)
CO2: 26 mmol/L (ref 22–32)
Calcium: 8.7 mg/dL — ABNORMAL LOW (ref 8.9–10.3)
Chloride: 101 mmol/L (ref 98–111)
Creatinine, Ser: 2.77 mg/dL — ABNORMAL HIGH (ref 0.44–1.00)
GFR calc Af Amer: 19 mL/min — ABNORMAL LOW (ref >60)
GFR calc non Af Amer: 17 mL/min — ABNORMAL LOW (ref >60)
Glucose, Bld: 139 mg/dL — ABNORMAL HIGH (ref 70–99)
Potassium: 3.9 mmol/L (ref 3.5–5.1)
Sodium: 138 mmol/L (ref 135–145)

## 2018-03-20 MED ORDER — CARVEDILOL 12.5 MG PO TABS
12.5000 mg | ORAL_TABLET | Freq: Two times a day (BID) | ORAL | Status: DC
  Administered 2018-03-20 – 2018-03-21 (×2): 12.5 mg via ORAL
  Filled 2018-03-20 (×2): qty 1

## 2018-03-20 NOTE — Progress Notes (Signed)
Occupational Therapy Treatment Patient Details Name: Susan [name] MRN: [id number] DOB: [birth year] Today's Date: 03/20/2018    History of present illness Susan [name] is an 67 y.o. 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 Sharol Given (TWB R foot with postop shoe), started noticing increasing swelling and pain in the left hand unable to flex the fingers.  Since patient's symptoms progressed patient came to the ER.   OT comments  Pt supine in bed and willing to transfer to recliner for lunch.  Pt completed bed mobility with supervision, sit to stand with RW with mod assist but unable to maintain TWB precautions and therefore completed lateral scoot to recliner with min assist.  Patient educated on lateral leans and lateral scoots to drop arm 3:1 commode, but reports "I only have 1 more week left and I don't need any more equipment".  Pt declining skilled rehab at DC, but believe she will best benefit from SNF. HHOT and aide if dc home.  Will continue to follow.     Follow Up Recommendations  SNF(pt declining SNF, if dc home will need maximal HH services )    Equipment Recommendations  3 in 1 bedside commode(drop arm (pt declining))    Recommendations for Other Services      Precautions / Restrictions Precautions Precautions: Fall Precaution Comments: Difficulty maintaining TWB R foot with any type of standing/stand pivot Required Braces or Orthoses: Other Brace/Splint Other Brace/Splint: post op shoe R foot Restrictions Weight Bearing Restrictions: Yes RLE Weight Bearing: Touchdown weight bearing       Mobility Bed Mobility Overal bed mobility: Needs Assistance Bed Mobility: Supine to Sit     Supine to sit: Supervision;HOB elevated     General bed mobility comments: increased time required  but no physical support  Transfers Overall transfer level: Needs assistance Equipment used: Rolling walker (2 wheeled) Transfers: Sit to/from Stand;Lateral/Scoot Transfers Sit to Stand: Mod assist        Lateral/Scoot Transfers: Min assist General transfer comment: Pt completed sit to stand with mod assist using RW, increased time and effort but unable to maintain TWB precautions to R LE; therefore completed lateral scoot to recliner with min assist maintaining precautions given cueing     Balance Overall balance assessment: Needs assistance;History of Falls Sitting-balance support: No upper extremity supported;Feet supported Sitting balance-Leahy Scale: Good     Standing balance support: Bilateral upper extremity supported Standing balance-Leahy Scale: Poor Standing balance comment: pt reliant on B UE support in standing, but unable to maitnain TWB with R LE                            ADL either performed or assessed with clinical judgement   ADL Overall ADL's : Needs assistance/impaired Eating/Feeding: Set up;Sitting                       Toilet Transfer: Minimal assistance(lateral scoot, simulated to recliner )     Toileting - Clothing Manipulation Details (indicate cue type and reason): initated educated about drop arm commode and lateral leaning for decreased assist and increased safety with TWB R LE     Functional mobility during ADLs: Minimal assistance       Vision       Perception  Praxis      Cognition Arousal/Alertness: Awake/alert Behavior During Therapy: WFL for tasks assessed/performed Overall Cognitive Status: Within Functional Limits for tasks assessed                                          Exercises     Shoulder Instructions       General Comments pt declining SNF    Pertinent Vitals/ Pain       Pain Assessment: Faces Faces Pain Scale: Hurts a little bit Pain Location: R foot Pain Descriptors  / Indicators: Sore Pain Intervention(s): Limited activity within patient's tolerance;Repositioned  Home Living                                          Prior Functioning/Environment              Frequency  Min 2X/week        Progress Toward Goals  OT Goals(current goals can now be found in the care plan section)  Progress towards OT goals: Progressing toward goals  Acute Rehab OT Goals Patient Stated Goal: to get stronger and go home OT Goal Formulation: With patient Time For Goal Achievement: 04/02/18 Potential to Achieve Goals: Good  Plan Discharge plan remains appropriate    Co-evaluation                 AM-PAC PT "6 Clicks" Daily Activity     Outcome Measure   Help from another person eating meals?: None Help from another person taking care of personal grooming?: None(in sitting) Help from another person toileting, which includes using toliet, bedpan, or urinal?: A Lot Help from another person bathing (including washing, rinsing, drying)?: A Lot Help from another person to put on and taking off regular upper body clothing?: None Help from another person to put on and taking off regular lower body clothing?: A Lot 6 Click Score: 18    End of Session Equipment Utilized During Treatment: Gait belt;Rolling walker;Other (comment)(post op shoe)  OT Visit Diagnosis: Other abnormalities of gait and mobility (R26.89);Repeated falls (R29.6);History of falling (Z91.81);Muscle weakness (generalized) (M62.81);Pain Pain - Right/Left: Right Pain - part of body: Ankle and joints of foot   Activity Tolerance Patient tolerated treatment well   Patient Left in chair;with chair alarm set;with call bell/phone within reach   Nurse Communication Mobility status        Time: 7939-0300 OT Time Calculation (min): 24 min  Charges: OT General Charges $OT Visit: 1 Visit OT Treatments $Self Care/Home Management : 23-37 mins  Delight Stare,  [place] Pager [phone] Office [phone]    Delight Stare 03/20/2018, 1:33 PM

## 2018-03-20 NOTE — Progress Notes (Signed)
PROGRESS NOTE    Susan [name]  NKN:[id number] DOB: 10-Jan-1951 DOA: 03/18/2018 PCP: Susan Rossetti, MD    Brief Narrative: Susan [name] is a 67 y.o. female with medical history significant of insulin-dependent diabetes on insulin pump, diastolic dysfunction CHF, chronic kidney disease stage III, hypothyroidism, coronary artery disease, peripheral vascular disease status post recent amputation as well as pulmonary hypertension who was just discharged from the hospital about a week ago after admission with hand swelling, Patient was evaluated at the time with a left long finger redness.  IV antibiotics given and discharged home on clindamycin.  she passed out at home and was found to be hypotensive. Patient  admitted with diagnosis of syncopal episode for work-up..   Assessment & Plan:   Principal Problem:   Syncope Active Problems:   Hyperlipemia   Essential hypertension   CAD (coronary artery disease)   Hypotension   Diabetes mellitus type II, uncontrolled ([place])   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 mellitus (HCC)   PVD (peripheral vascular disease) (HCC)  Syncope from orthostatic hypotension and hypovolemia from medications. Holding all the medications at this time. Repeat orthostatics were negative. echocardiogram ordered showed mild LVH, Systolic   function was normal. The estimated ejection fraction was in the   range of 55% to 60%. Doppler parameters are consistent with   abnormal left ventricular relaxation (grade 1 diastolic   dysfunction). Serial troponin's negative.  EKG not show any ischemic changes.  Overnight telemetry monitor does not show any ischemic changes   Hyperlipidemia:  Resume with Lipitor   Diabetes mellitus: CBG (last 3)  Recent Labs    03/20/18 0238 03/20/18 0616 03/20/18 1124  GLUCAP 210* 110* 164*   Resume insulin pump and SSI   ACUTE ON Stage 4 CKD:  Possibly pre renal in etiology.  Some improvement with IV fluids.  Patient admitted  with a creatinine of 3.3 and today creatinine improved to 2.7.  Her baseline creatinine is around 2.  Will recommend to continue IV fluids for another 24 hours and recheck creatinine. Ultrasound renal does not show any hydronephrosis.  Avoid nephrotoxins especially losartan at this time.  Torsemide is also on hold for worsening renal para meters.  Hypokalemia: repleted.   Elevated lactic acid:  From dehydration and hypotension:  Resolved.   Hypothyroidism: resume synthroid.    Foot pain;  X-ray of the foot does not show any fractures.  Resume Vicodin.  Chronic diastolic heart failure patient on torsemide which is on hold since admission for AKI  Hypotension resolved restart patient's Coreg at this time and watch for hypotension.  CAD: No chest pain or sob.  Resume aspirin and plavix.   DVT prophylaxis: heparin sq Code Status: full code.  Family Communication: none at bedside.  Asked with family yesterday evening. Disposition Plan: pending clinical improvement.  Possible discharge home with home PT the next 24 hours.  Consultants:   None.   Procedures:  None.  Antimicrobials: none.   Subjective: No complaints other than foot pain.  No chest pain or shortness of breath.  Objective: Vitals:   03/20/18 0300 03/20/18 0313 03/20/18 0400 03/20/18 1125  BP:  129/60  (!) 148/65  Pulse: 63 64 63 72  Resp: 12 13 12 16   Temp:  98 F (36.7 C)  98.2 F (36.8 C)  TempSrc:  Oral  Oral  SpO2: 94% 94% 95% 93%  Weight:      Height:        Intake/Output  Summary (Last 24 hours) at 03/20/2018 1301 Last data filed at 03/20/2018 0837 Gross per 24 hour  Intake 2314.65 ml  Output 1800 ml  Net 514.65 ml   Filed Weights   03/19/18 0459  Weight: 117.1 kg    Examination:  General exam: Mild distress from foot pain Respiratory system: Clear to auscultation. Respiratory effort normal.  No wheezing or rhonchi Cardiovascular system: S1 & S2 heard, RRR. No JVD,.  No  murmur Gastrointestinal system: Abdomen is soft, nontender, nondistended with good bowel sounds Central nervous system: Alert and oriented.  Nonfocal Extremities: Symmetric 5 x 5 power. right toe amputation.  Skin: No rashes, lesions or ulcers Psychiatry:  Mood & affect appropriate.     Data Reviewed: I have personally reviewed following labs and imaging studies  CBC: Recent Labs  Lab 03/18/18 1919 03/19/18 0053 03/19/18 0644 03/20/18 0518  WBC 9.0 7.9 6.2 5.4  NEUTROABS 6.8  --   --   --   HGB 12.6 12.0 11.3* 11.1*  HCT 41.8 39.8 36.7 37.8  MCV 74.2* 74.4* 74.3* 76.4*  PLT 249 197 197 831   Basic Metabolic Panel: Recent Labs  Lab 03/18/18 1919 03/19/18 0053 03/19/18 0644 03/20/18 0518  NA 132*  --  139 138  K 3.9  --  3.3* 3.9  CL 88*  --  95* 101  CO2 28  --  28 26  GLUCOSE 199*  --  132* 139*  BUN 94*  --  90* 79*  CREATININE 3.33* 2.97* 2.97* 2.77*  CALCIUM 9.8  --  9.3 8.7*   GFR: Estimated Creatinine Clearance: 25.3 mL/min (A) (by C-G formula based on SCr of 2.77 mg/dL (H)). Liver Function Tests: Recent Labs  Lab 03/18/18 1919 03/19/18 0644  AST 17 14*  ALT 17 15  ALKPHOS 92 80  BILITOT 0.9 0.4  PROT 7.3 6.2*  ALBUMIN 3.8 3.0*   No results for input(s): LIPASE, AMYLASE in the last 168 hours. No results for input(s): AMMONIA in the last 168 hours. Coagulation Profile: No results for input(s): INR, PROTIME in the last 168 hours. Cardiac Enzymes: Recent Labs  Lab 03/18/18 1919 03/19/18 0053 03/19/18 0644  TROPONINI 0.03* <0.03 0.03*   BNP (last 3 results) No results for input(s): PROBNP in the last 8760 hours. HbA1C: No results for input(s): HGBA1C in the last 72 hours. CBG: Recent Labs  Lab 03/19/18 1708 03/19/18 2120 03/20/18 0238 03/20/18 0616 03/20/18 1124  GLUCAP 478* 446* 210* 110* 164*   Lipid Profile: No results for input(s): CHOL, HDL, LDLCALC, TRIG, CHOLHDL, LDLDIRECT in the last 72 hours. Thyroid Function Tests: No  results for input(s): TSH, T4TOTAL, FREET4, T3FREE, THYROIDAB in the last 72 hours. Anemia Panel: No results for input(s): VITAMINB12, FOLATE, FERRITIN, TIBC, IRON, RETICCTPCT in the last 72 hours. Sepsis Labs: Recent Labs  Lab 03/18/18 2025 03/19/18 1211  LATICACIDVEN 2.30* 1.7    Recent Results (from the past 240 hour(s))  Blood culture (routine x 2)     Status: None (Preliminary result)   Collection Time: 03/18/18  8:00 PM  Result Value Ref Range Status   Specimen Description BLOOD RIGHT HAND  Final   Special Requests   Final    BOTTLES DRAWN AEROBIC ONLY Blood Culture results may not be optimal due to an inadequate volume of blood received in culture bottles   Culture   Final    NO GROWTH 2 DAYS Performed at [facility] [address] [place], [place] [address]  Report Status PENDING  Incomplete  Blood culture (routine x 2)     Status: None (Preliminary result)   Collection Time: 03/18/18  8:15 PM  Result Value Ref Range Status   Specimen Description BLOOD LEFT FOREARM  Final   Special Requests   Final    BOTTLES DRAWN AEROBIC AND ANAEROBIC Blood Culture results may not be optimal due to an inadequate volume of blood received in culture bottles   Culture   Final    NO GROWTH 2 DAYS Performed at [facility], [place] [address] [place], [place] [address]    Report Status PENDING  Incomplete  Urine culture     Status: Abnormal (Preliminary result)   Collection Time: 03/18/18 10:08 PM  Result Value Ref Range Status   Specimen Description URINE, CLEAN CATCH  Final   Special Requests NONE  Final   Culture (A)  Final    20,000 COLONIES/mL KLEBSIELLA PNEUMONIAE 20,000 COLONIES/mL ENTEROCOCCUS FAECALIS SUSCEPTIBILITIES TO FOLLOW Performed at [facility], [place] [address], [place], [place] [address]    Report Status PENDING  Incomplete         Radiology Studies: Dg Chest 2 View  Result Date: 03/18/2018 CLINICAL DATA:  Weakness EXAM: CHEST - 2 VIEW  COMPARISON:  03/10/2018 FINDINGS: Cardiomegaly. No confluent airspace opacities or effusions. No acute bony abnormality. IMPRESSION: Cardiomegaly.  No active disease. Electronically Signed   By: Rolm Baptise M.D.   On: 03/18/2018 22:03   US Renal  Result Date: 03/19/2018 CLINICAL DATA:  Acute kidney injury EXAM: RENAL / URINARY TRACT ULTRASOUND COMPLETE COMPARISON:  07/23/2017 FINDINGS: Right Kidney: Length: 12.7 cm. Thinning of the renal cortex. No hydronephrosis or focal abnormality. Left Kidney: Length: 12.7 cm. Thinning of the renal cortex. No hydronephrosis or focal abnormality. Bladder: Appears normal for degree of bladder distention. IMPRESSION: 1. Thinning of the renal cortex, consistent with mild atrophy. 2. No hydronephrosis Electronically Signed   By: Donavan Foil M.D.   On: 03/19/2018 14:15   Dg Foot Complete Right  Result Date: 03/18/2018 CLINICAL DATA:  67 y/o F; amputation 3 weeks ago. Altered mental status. EXAM: RIGHT FOOT COMPLETE - 3+ VIEW COMPARISON:  02/13/2018 right foot radiograph. FINDINGS: Amputation of the fourth and fifth digits across the metatarsophalangeal joints. Bones are demineralized. No acute fracture, dislocation, or erosive bony lesion identified. Large plantar calcaneal enthesophyte. Plantar aponeurosis calcification. Intertarsal osteoarthrosis with multiple osteophytes. IMPRESSION: No acute osseous abnormality identified. Interval amputation of the fourth and fifth digits. Electronically Signed   By: Kristine Garbe M.D.   On: 03/18/2018 20:03        Scheduled Meds: . allopurinol  100 mg Oral BID  . aspirin EC  81 mg Oral Daily  . cholecalciferol  1,000 Units Oral Daily  . clopidogrel  75 mg Oral Daily  . escitalopram  20 mg Oral Daily  . ferrous sulfate  325 mg Oral Q breakfast  . heparin  5,000 Units Subcutaneous Q8H  . insulin pump   Subcutaneous TID AC, HS, 0200  . levothyroxine  50 mcg Oral QAC breakfast  . magnesium oxide  400 mg Oral  Daily  . rosuvastatin  20 mg Oral Daily  . sodium chloride flush  3 mL Intravenous Q12H  . vitamin E  800 Units Oral Daily   Continuous Infusions: . sodium chloride 100 mL/hr at 03/20/18 1228     LOS: 1 day    Time spent: 32 minutes.     Hosie Poisson,  MD [facility] Pager [phone]  If 7PM-7AM, please contact night-coverage [email or web address] Password [email or web address] 03/20/2018, 1:01 PM

## 2018-03-21 LAB — BASIC METABOLIC PANEL
Anion gap: 5 (ref 5–15)
BUN: 52 mg/dL — ABNORMAL HIGH (ref 8–23)
CO2: 25 mmol/L (ref 22–32)
Calcium: 8.4 mg/dL — ABNORMAL LOW (ref 8.9–10.3)
Chloride: 109 mmol/L (ref 98–111)
Creatinine, Ser: 1.76 mg/dL — ABNORMAL HIGH (ref 0.44–1.00)
GFR calc Af Amer: 33 mL/min — ABNORMAL LOW (ref >60)
GFR calc non Af Amer: 29 mL/min — ABNORMAL LOW (ref >60)
Glucose, Bld: 169 mg/dL — ABNORMAL HIGH (ref 70–99)
Potassium: 4 mmol/L (ref 3.5–5.1)
Sodium: 139 mmol/L (ref 135–145)

## 2018-03-21 LAB — URINE CULTURE: Culture: 20000 — AB

## 2018-03-21 LAB — GLUCOSE, CAPILLARY
Glucose-Capillary: 188 mg/dL — ABNORMAL HIGH (ref 70–99)
Glucose-Capillary: 170 mg/dL — ABNORMAL HIGH (ref 70–99)
Glucose-Capillary: 129 mg/dL — ABNORMAL HIGH (ref 70–99)

## 2018-03-21 NOTE — Progress Notes (Signed)
Patient discharged in stable condition with all belongings and husband at bedside. She verbalized understanding of all discharge instructions and importance of follow up visits.

## 2018-03-21 NOTE — Care Management (Signed)
Home health order placed for resumption of PT/OT with [facility].  [facility] aware.

## 2018-03-21 NOTE — Progress Notes (Signed)
Occupational Therapy Treatment Patient Details Name: Susan [name] MRN: [id number] DOB: 1950-10-22 Today's Date: 03/21/2018    History of present illness Susan [name] is an 67 y.o. 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 Sharol Given (TWB R foot with postop shoe), started noticing increasing swelling and pain in the left hand unable to flex the fingers.  Since patient's symptoms progressed patient came to the ER.   OT comments  Patient participates well.  Completes sit to stand with min assist from elevated bed today using RW, continues to require cueing for hand placement and safety as she tends to bear too much weight through R heel.  Reviewed precautions and importance of adherence with patient and spouse, as well as compensatory techniques for ADL participation/transfers to ensure adherence until weight bearing restrictions are lifted. Pt verbalizes no further questions or concerns, plans to dc home today. Updated recs to [facility] and aide. Will continue to follow while admitted.    Follow Up Recommendations  Home health OT;Other (comment)(aide)    Equipment Recommendations  3 in 1 bedside commode(drop arm (pt declining))    Recommendations for Other Services      Precautions / Restrictions Precautions Precautions: Fall Precaution Comments: Difficulty maintaining TWB R foot with any type of standing/stand pivot Required Braces or Orthoses: Other Brace/Splint Other Brace/Splint: post op shoe R foot Restrictions Weight Bearing Restrictions: Yes RLE Weight Bearing: Touchdown weight bearing       Mobility Bed Mobility Overal bed mobility: Needs Assistance Bed Mobility: Supine to Sit     Supine to sit: Supervision;HOB elevated     General bed mobility comments: increased time required but no  physical support  Transfers Overall transfer level: Needs assistance Equipment used: Rolling walker (2 wheeled) Transfers: Sit to/from [facility] Sit to Stand: Min assist Stand pivot transfers: Min assist       General transfer comment: increased ease with sit to stand today (min assist) given cueing for hand placement and safety, kicking R LE foward to adhere to precautions; patient continues to bear increased weight through R heel when stepping with L LE     Balance Overall balance assessment: Needs assistance;History of Falls Sitting-balance support: No upper extremity supported;Feet supported Sitting balance-Leahy Scale: Good     Standing balance support: Bilateral upper extremity supported Standing balance-Leahy Scale: Poor Standing balance comment: pt reliant on B UE support in standing, but unable to maitnain TWB with R LE                            ADL either performed or assessed with clinical judgement   ADL Overall ADL's : Needs assistance/impaired             Lower Body Bathing: Minimal assistance;Sitting/lateral leans;Bed level Lower Body Bathing Details (indicate cue type and reason): reviewed lateral leans seated on 3:1 for bathing, pt agreeable; spouse reports planning to assist from bed level     Lower Body Dressing: Maximal assistance;Sitting/lateral leans Lower Body Dressing Details (indicate cue type and reason): reviewed lateral leans for LB dressing, requires assist for B shoes today; reviewed use of reacher  Toilet Transfer: Minimal assistance;Stand-pivot(simulated to recliner ) Toilet Transfer Details (indicate cue type and reason): pt with poor adherance to TWB R LE during transfer  but adament to complete stand pivot Toileting- Clothing Manipulation and Hygiene: Moderate assistance;Sitting/lateral lean Toileting - Clothing Manipulation Details (indicate cue type and reason): reviewed lateral leans for toileting needs or  assist for clothing mgmt when standing      Functional mobility during ADLs: Minimal assistance;Rolling walker General ADL Comments: patient reports plan to have assist for all mobility and self care tasks as needed, she continues to be noncompliant with TWB to R LE      Vision       Perception     Praxis      Cognition Arousal/Alertness: Awake/alert Behavior During Therapy: WFL for tasks assessed/performed Overall Cognitive Status: Within Functional Limits for tasks assessed                                          Exercises     Shoulder Instructions       General Comments spouse present; patient and spouse educated on weight bearing and importance of adherance; pt verbalizes "probably putting too much through my heel", and encouraged lateral leans,/ lateral scoots for self care and transfers    Pertinent Vitals/ Pain       Pain Assessment: No/denies pain  Home Living                                          Prior Functioning/Environment              Frequency  Min 2X/week        Progress Toward Goals  OT Goals(current goals can now be found in the care plan section)  Progress towards OT goals: Progressing toward goals  Acute Rehab OT Goals Patient Stated Goal: to get stronger and go home OT Goal Formulation: With patient Time For Goal Achievement: 04/02/18 Potential to Achieve Goals: Good  Plan Discharge plan needs to be updated;Frequency remains appropriate    Co-evaluation                 AM-PAC PT "6 Clicks" Daily Activity     Outcome Measure   Help from another person eating meals?: None Help from another person taking care of personal grooming?: None(seated) Help from another person toileting, which includes using toliet, bedpan, or urinal?: A Lot Help from another person bathing (including washing, rinsing, drying)?: A Lot Help from another person to put on and taking off regular upper body  clothing?: None Help from another person to put on and taking off regular lower body clothing?: A Lot 6 Click Score: 18    End of Session Equipment Utilized During Treatment: Gait belt;Rolling walker;Other (comment)(post op shoe)  OT Visit Diagnosis: Other abnormalities of gait and mobility (R26.89);Repeated falls (R29.6);History of falling (Z91.81);Muscle weakness (generalized) (M62.81);Pain Pain - Right/Left: Right Pain - part of body: Ankle and joints of foot   Activity Tolerance Patient tolerated treatment well   Patient Left in chair;with call bell/phone within reach;with family/visitor present   Nurse Communication Mobility status        Time: 2683-4196 OT Time Calculation (min): 25 min  Charges: OT General Charges $OT Visit: 1 Visit OT Treatments $Self Care/Home Management : 23-37 mins  Delight Stare, [place] Pager [phone] Office [phone]    Delight Stare 03/21/2018, 1:10 PM

## 2018-03-22 DIAGNOSIS — E1122 Type 2 diabetes mellitus with diabetic chronic kidney disease: Secondary | ICD-10-CM | POA: Diagnosis not present

## 2018-03-22 DIAGNOSIS — I5030 Unspecified diastolic (congestive) heart failure: Secondary | ICD-10-CM | POA: Diagnosis not present

## 2018-03-22 DIAGNOSIS — I509 Heart failure, unspecified: Secondary | ICD-10-CM | POA: Diagnosis not present

## 2018-03-22 DIAGNOSIS — D631 Anemia in chronic kidney disease: Secondary | ICD-10-CM | POA: Diagnosis not present

## 2018-03-22 DIAGNOSIS — G4733 Obstructive sleep apnea (adult) (pediatric): Secondary | ICD-10-CM | POA: Diagnosis not present

## 2018-03-22 DIAGNOSIS — N184 Chronic kidney disease, stage 4 (severe): Secondary | ICD-10-CM | POA: Diagnosis not present

## 2018-03-22 DIAGNOSIS — I129 Hypertensive chronic kidney disease with stage 1 through stage 4 chronic kidney disease, or unspecified chronic kidney disease: Secondary | ICD-10-CM | POA: Diagnosis not present

## 2018-03-22 NOTE — Discharge Summary (Signed)
Physician Discharge Summary  Susan [name] NUU:[id number] DOB: [birth year] DOA: 03/18/2018  PCP: Alycia Rossetti, MD  Admit date: 03/18/2018 Discharge date: 03/21/2018  Admitted From: Home.  Disposition:  Home.   Recommendations for Outpatient Follow-up:  1. Follow up with PCP in 1-2 weeks 2. Please obtain BMP/CBC in one week 3. Please follow up with nephrology as recommended.   Home Health: yes   Discharge Condition:stable.  CODE STATUS: full code.  Diet recommendation: Heart Healthy   Brief/Interim Summary: Susan [name] a 67 y.o.femalewith medical history significant ofinsulin-dependent diabetes on insulin pump, diastolic dysfunction CHF, chronic kidney disease stage III, hypothyroidism, coronary artery disease, peripheral vascular disease status post recent amputation as well as pulmonary hypertension who was just discharged from the hospital about a week ago after admission with hand swelling, Patient was evaluated at the time with a left long finger redness. IV antibiotics given and discharged home on clindamycin. she passed out at home and was found to be hypotensive. Patient  admitted with diagnosis of syncopal episode for work-up.Marland Kitchen   Discharge Diagnoses:  Principal Problem:   Syncope Active Problems:   Hyperlipemia   Essential hypertension   CAD (coronary artery disease)   Hypotension   Diabetes mellitus type II, uncontrolled ([facility])   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 mellitus (HCC)   PVD (peripheral vascular disease) (HCC)   Syncope from orthostatic hypotension and hypovolemia from medications. Holding all the medications at this time. Repeat orthostatics were negative. echocardiogram ordered showed mild LVH, Systolic function was normal. The estimated ejection fraction was in the range of 55% to 60%. Doppler parameters are consistent with abnormal left ventricular relaxation (grade 1 diastolic dysfunction). Serial troponin's negative.  EKG not  show any ischemic changes.  Overnight telemetry monitor does not show any ischemic changes   Hyperlipidemia:  Resume with Lipitor   Diabetes mellitus: CBG (last 3)  RecentLabs(last2labs)  Recent Labs    03/20/18 0238 03/20/18 0616 03/20/18 1124  GLUCAP 210* 110* 164*     Resume insulin pump and SSI   ACUTE ON Stage 4 CKD:  Possibly pre renal in etiology.  Some improvement with IV fluids.  Patient admitted with a creatinine of 3.3 and today creatinine improved to 1.7.  Her baseline creatinine is around 2.   Ultrasound renal does not show any hydronephrosis.  Avoid nephrotoxins especially losartan at this time.   Recommend outpatient follow upw ith nephrology tomorrow.   Hypokalemia: repleted.   Elevated lactic acid:  From dehydration and hypotension:  Resolved.   Hypothyroidism: resume synthroid.    Foot pain;  X-ray of the foot does not show any fractures.  Resume Vicodin.  Chronic diastolic heart failure patient on torsemide which is on hold since admission for AKI  Hypotension resolved restart patient's Coreg at this time and watch for hypotension.  CAD: No chest pain or sob.  Resume aspirin and plavix.   Discharge Instructions  Discharge Instructions    Diet - low sodium heart healthy   Complete by:  As directed    Discharge instructions   Complete by:  As directed    PLEASE Follow up with Nephrology Dr Hollie Salk tomorrow for further recommendations.  We are holding losartan and metolazone.     Allergies as of 03/21/2018      Reactions   Codeine Nausea And Vomiting      Medication List    STOP taking these medications   isosorbide dinitrate 10 MG tablet Commonly known as:  ISORDIL  losartan 50 MG tablet Commonly known as:  COZAAR   ondansetron 4 MG tablet Commonly known as:  ZOFRAN   promethazine 12.5 MG tablet Commonly known as:  PHENERGAN     TAKE these medications   allopurinol 100 MG tablet Commonly known as:   ZYLOPRIM Take 1 tablet (100 mg total) by mouth 2 (two) times daily.   aspirin EC 81 MG tablet Take 81 mg by mouth daily.   carvedilol 12.5 MG tablet Commonly known as:  COREG TAKE 1 TABLET BY MOUTH TWICE A DAY WITH A MEAL What changed:  See the new instructions.   cholecalciferol 1000 units tablet Commonly known as:  VITAMIN D Take 1,000 Units by mouth daily.   clopidogrel 75 MG tablet Commonly known as:  PLAVIX TAKE 1 TABLET BY MOUTH DAILY WITH BREAKFAST. What changed:  when to take this   colchicine 0.6 MG tablet Take 1 tablet (0.6 mg total) by mouth daily.   escitalopram 20 MG tablet Commonly known as:  LEXAPRO Take 1 tablet (20 mg total) by mouth daily.   ferrous sulfate 325 (65 FE) MG tablet Take 325 mg by mouth daily with breakfast. Reported on 08/21/2015   HYDROcodone-acetaminophen 5-325 MG tablet Commonly known as:  NORCO/VICODIN Take 1 tablet by mouth every 4 (four) hours as needed for moderate pain.   insulin NPH Human 100 UNIT/ML injection Commonly known as:  HUMULIN N,NOVOLIN N Inject 15 Units into the skin every morning.   insulin pump Soln Inject into the skin.   insulin regular 100 units/mL injection Commonly known as:  NOVOLIN R,HUMULIN R Inject 5-15 Units into the skin See admin instructions. Sliding scale 80-150 5 units, 150-200 7 units, 201-250 10 units, 251-300 12 units, 301-400 15 units.   levothyroxine 50 MCG tablet Commonly known as:  SYNTHROID, LEVOTHROID Take 1 tablet (50 mcg total) by mouth daily before breakfast.   Magnesium Oxide 400 (240 Mg) MG Tabs TAKE 1 TABLET BY MOUTH EVERY DAY What changed:    how much to take  how to take this  when to take this   metolazone 2.5 MG tablet Commonly known as:  ZAROXOLYN TAKE 1 TABLET BY MOUTH ONCE A WEEK. What changed:  Another medication with the same name was removed. Continue taking this medication, and follow the directions you see here.   nitroGLYCERIN 0.4 MG SL tablet Commonly  known as:  NITROSTAT PLACE 1 TABLET (0.4 MG TOTAL) UNDER THE TONGUE EVERY 5 (FIVE) MINUTES AS NEEDED FOR CHEST PAIN.   ONETOUCH VERIO test strip Generic drug:  glucose blood   pregabalin 150 MG capsule Commonly known as:  LYRICA Take 1 capsule (150 mg total) by mouth 2 (two) times daily.   rosuvastatin 20 MG tablet Commonly known as:  CRESTOR TAKE 1 TABLET BY MOUTH EVERY DAY   torsemide 20 MG tablet Commonly known as:  DEMADEX Take 80 mg by mouth 2 (two) times daily.   vitamin E 1000 UNIT capsule Generic drug:  vitamin E Take 1,000 Units by mouth daily.      Follow-up Information    Kalihiwai, [name], MD. Schedule an appointment as soon as possible for a visit in 1 week(s).   Specialty:  Family Medicine Contact information: [address] [place] [address] [phone]        Bensimhon, Shaune Pascal, MD .   Specialty:  Cardiology Contact information: [address] [place] [address] [phone]  Madelon Lips, MD Follow up.   Specialty:  Nephrology Why:  call tomorrow Contact information: [address] [place] [address] [phone]          Allergies  Allergen Reactions  . Codeine Nausea And Vomiting    Consultations:  None.    Procedures/Studies: Dg Chest 1 View  Result Date: 03/10/2018 CLINICAL DATA:  Dyspnea EXAM: CHEST  1 VIEW COMPARISON:  02/13/2018 FINDINGS: Mild cardiomegaly that is stable. Negative mediastinal contours. Improved aeration since prior. There is no edema, consolidation, effusion, or pneumothorax. IMPRESSION: No evidence of acute disease. Electronically Signed   By: Monte Fantasia M.D.   On: 03/10/2018 14:08   Dg Chest 2 View  Result Date: 03/18/2018 CLINICAL DATA:  Weakness EXAM: CHEST - 2 VIEW COMPARISON:  03/10/2018 FINDINGS: Cardiomegaly. No confluent airspace opacities or effusions. No acute bony abnormality. IMPRESSION: Cardiomegaly.  No active disease. Electronically Signed    By: Rolm Baptise M.D.   On: 03/18/2018 22:03   US Renal  Result Date: 03/19/2018 CLINICAL DATA:  Acute kidney injury EXAM: RENAL / URINARY TRACT ULTRASOUND COMPLETE COMPARISON:  07/23/2017 FINDINGS: Right Kidney: Length: 12.7 cm. Thinning of the renal cortex. No hydronephrosis or focal abnormality. Left Kidney: Length: 12.7 cm. Thinning of the renal cortex. No hydronephrosis or focal abnormality. Bladder: Appears normal for degree of bladder distention. IMPRESSION: 1. Thinning of the renal cortex, consistent with mild atrophy. 2. No hydronephrosis Electronically Signed   By: Donavan Foil M.D.   On: 03/19/2018 14:15   Nm Pulmonary Perf And Vent  Result Date: 03/10/2018 CLINICAL DATA:  67 year old female with acute hypoxia and hypotension. EXAM: NUCLEAR MEDICINE VENTILATION - PERFUSION LUNG SCAN TECHNIQUE: Ventilation images were obtained in multiple projections using inhaled aerosol Tc-49m DTPA. Perfusion images were obtained in multiple projections after intravenous injection of Tc-48m-MAA. RADIOPHARMACEUTICALS:  30.0 mCi of Tc-69m DTPA aerosol inhalation and 4.0 mCi Tc73m-MAA IV COMPARISON:  V/Q scan 02/14/2018. Portable chest radiograph 1344 hours today. FINDINGS: Ventilation: Stable and fairly homogeneous ventilation activity. No focal ventilation defect. Perfusion: Homogeneous perfusion activity. No wedge shaped peripheral perfusion defects to suggest acute pulmonary embolism. IMPRESSION: Stable and normal VQ scan. Electronically Signed   By: Genevie Ann M.D.   On: 03/10/2018 16:36   Dg Hand Complete Left  Result Date: 03/05/2018 CLINICAL DATA:  Initial evaluation for acute left hand pain, erythema, edema. EXAM: LEFT HAND - COMPLETE 3+ VIEW COMPARISON:  None. FINDINGS: No acute fracture dislocation. Mild osteoarthritic changes present about the hand, most notable at the first IP joint. Osseous mineralization normal. Mild diffuse soft tissue swelling about the hand, most notable at the ulnar aspect.  No abnormal soft tissue emphysema. No radiopaque foreign body. No osseous erosive changes. IMPRESSION: 1. Mild diffuse soft tissue swelling about the hand. 2. No other acute osseous abnormality. Electronically Signed   By: Jeannine Boga M.D.   On: 03/05/2018 19:35   Dg Foot Complete Right  Result Date: 03/18/2018 CLINICAL DATA:  67 y/o F; amputation 3 weeks ago. Altered mental status. EXAM: RIGHT FOOT COMPLETE - 3+ VIEW COMPARISON:  02/13/2018 right foot radiograph. FINDINGS: Amputation of the fourth and fifth digits across the metatarsophalangeal joints. Bones are demineralized. No acute fracture, dislocation, or erosive bony lesion identified. Large plantar calcaneal enthesophyte. Plantar aponeurosis calcification. Intertarsal osteoarthrosis with multiple osteophytes. IMPRESSION: No acute osseous abnormality identified. Interval amputation of the fourth and fifth digits. Electronically Signed   By: Kristine Garbe M.D.   On: 03/18/2018 20:03  Subjective: No complaints of dysuria.   Discharge Exam: Vitals:   03/21/18 0922 03/21/18 1156  BP: (!) 146/63 (!) 157/61  Pulse: 78 74  Resp: 20 19  Temp: 98.2 F (36.8 C) 98.2 F (36.8 C)  SpO2: 93% 97%   Vitals:   03/21/18 0413 03/21/18 0500 03/21/18 0922 03/21/18 1156  BP: (!) 158/68  (!) 146/63 (!) 157/61  Pulse: 78 74 78 74  Resp: 16 19 20 19   Temp: 98.9 F (37.2 C)  98.2 F (36.8 C) 98.2 F (36.8 C)  TempSrc: Oral  Oral Oral  SpO2: 97% 93% 93% 97%  Weight:      Height:        General: Pt is alert, awake, not in acute distress Cardiovascular: RRR, S1/S2 +, no rubs, no gallops Respiratory: CTA bilaterally, no wheezing, no rhonchi Abdominal: Soft, NT, ND, bowel sounds + Extremities: no edema, no cyanosis    The results of significant diagnostics from this hospitalization (including imaging, microbiology, ancillary and laboratory) are listed below for reference.     Microbiology: Recent Results (from  the past 240 hour(s))  Blood culture (routine x 2)     Status: None (Preliminary result)   Collection Time: 03/18/18  8:00 PM  Result Value Ref Range Status   Specimen Description BLOOD RIGHT HAND  Final   Special Requests   Final    BOTTLES DRAWN AEROBIC ONLY Blood Culture results may not be optimal due to an inadequate volume of blood received in culture bottles   Culture   Final    NO GROWTH 3 DAYS Performed at [facility], [place] [address], [place], [place] [address]    Report Status PENDING  Incomplete  Blood culture (routine x 2)     Status: None (Preliminary result)   Collection Time: 03/18/18  8:15 PM  Result Value Ref Range Status   Specimen Description BLOOD LEFT FOREARM  Final   Special Requests   Final    BOTTLES DRAWN AEROBIC AND ANAEROBIC Blood Culture results may not be optimal due to an inadequate volume of blood received in culture bottles   Culture   Final    NO GROWTH 3 DAYS Performed at [facility], [place] [address], [place], [place] [address]    Report Status PENDING  Incomplete  Urine culture     Status: Abnormal   Collection Time: 03/18/18 10:08 PM  Result Value Ref Range Status   Specimen Description URINE, CLEAN CATCH  Final   Special Requests   Final    NONE Performed at [facility], [place] [address] [place], [place] [address]    Culture (A)  Final    20,000 COLONIES/mL KLEBSIELLA PNEUMONIAE 20,000 COLONIES/mL ENTEROCOCCUS FAECALIS    Report Status 03/21/2018 FINAL  Final   Organism ID, Bacteria KLEBSIELLA PNEUMONIAE (A)  Final   Organism ID, Bacteria ENTEROCOCCUS FAECALIS (A)  Final      Susceptibility   Enterococcus faecalis - MIC*    AMPICILLIN <=2 SENSITIVE Sensitive     LEVOFLOXACIN 0.5 SENSITIVE Sensitive     NITROFURANTOIN <=16 SENSITIVE Sensitive     VANCOMYCIN 1 SENSITIVE Sensitive     * 20,000 COLONIES/mL ENTEROCOCCUS FAECALIS   Klebsiella pneumoniae - MIC*    AMPICILLIN >=32 RESISTANT Resistant     CEFAZOLIN <=4  SENSITIVE Sensitive     CEFTRIAXONE <=1 SENSITIVE Sensitive     CIPROFLOXACIN 1 SENSITIVE Sensitive     GENTAMICIN <=1 SENSITIVE Sensitive  IMIPENEM <=0.25 SENSITIVE Sensitive     NITROFURANTOIN 128 RESISTANT Resistant     TRIMETH/SULFA <=20 SENSITIVE Sensitive     AMPICILLIN/SULBACTAM 8 SENSITIVE Sensitive     PIP/TAZO 8 SENSITIVE Sensitive     Extended ESBL NEGATIVE Sensitive     * 20,000 COLONIES/mL KLEBSIELLA PNEUMONIAE     Labs: BNP (last 3 results) Recent Labs    04/29/17 1109 02/13/18 1247 03/18/18 1919  BNP 96.3 728.7* 53.9   Basic Metabolic Panel: Recent Labs  Lab 03/18/18 1919 03/19/18 0053 03/19/18 0644 03/20/18 0518 03/21/18 0825  NA 132*  --  139 138 139  K 3.9  --  3.3* 3.9 4.0  CL 88*  --  95* 101 109  CO2 28  --  28 26 25   GLUCOSE 199*  --  132* 139* 169*  BUN 94*  --  90* 79* 52*  CREATININE 3.33* 2.97* 2.97* 2.77* 1.76*  CALCIUM 9.8  --  9.3 8.7* 8.4*   Liver Function Tests: Recent Labs  Lab 03/18/18 1919 03/19/18 0644  AST 17 14*  ALT 17 15  ALKPHOS 92 80  BILITOT 0.9 0.4  PROT 7.3 6.2*  ALBUMIN 3.8 3.0*   No results for input(s): LIPASE, AMYLASE in the last 168 hours. No results for input(s): AMMONIA in the last 168 hours. CBC: Recent Labs  Lab 03/18/18 1919 03/19/18 0053 03/19/18 0644 03/20/18 0518  WBC 9.0 7.9 6.2 5.4  NEUTROABS 6.8  --   --   --   HGB 12.6 12.0 11.3* 11.1*  HCT 41.8 39.8 36.7 37.8  MCV 74.2* 74.4* 74.3* 76.4*  PLT 249 197 197 157   Cardiac Enzymes: Recent Labs  Lab 03/18/18 1919 03/19/18 0053 03/19/18 0644  TROPONINI 0.03* <0.03 0.03*   BNP: Invalid input(s): POCBNP CBG: Recent Labs  Lab 03/20/18 1655 03/20/18 2121 03/21/18 0249 03/21/18 0639 03/21/18 1152  GLUCAP 255* 255* 188* 170* 129*   D-Dimer No results for input(s): DDIMER in the last 72 hours. Hgb A1c No results for input(s): HGBA1C in the last 72 hours. Lipid Profile No results for input(s): CHOL, HDL, LDLCALC, TRIG,  CHOLHDL, LDLDIRECT in the last 72 hours. Thyroid function studies No results for input(s): TSH, T4TOTAL, T3FREE, THYROIDAB in the last 72 hours.  Invalid input(s): FREET3 Anemia work up No results for input(s): VITAMINB12, FOLATE, FERRITIN, TIBC, IRON, RETICCTPCT in the last 72 hours. Urinalysis    Component Value Date/Time   COLORURINE STRAW (A) 03/18/2018 1839   APPEARANCEUR CLEAR 03/18/2018 1839   LABSPEC 1.008 03/18/2018 1839   PHURINE 5.0 03/18/2018 1839   GLUCOSEU 150 (A) 03/18/2018 1839   HGBUR SMALL (A) 03/18/2018 1839   BILIRUBINUR NEGATIVE 03/18/2018 1839   KETONESUR NEGATIVE 03/18/2018 1839   PROTEINUR 100 (A) 03/18/2018 1839   UROBILINOGEN 0.2 04/01/2014 1659   NITRITE NEGATIVE 03/18/2018 1839   LEUKOCYTESUR SMALL (A) 03/18/2018 1839   Sepsis Labs Invalid input(s): PROCALCITONIN,  WBC,  LACTICIDVEN Microbiology Recent Results (from the past 240 hour(s))  Blood culture (routine x 2)     Status: None (Preliminary result)   Collection Time: 03/18/18  8:00 PM  Result Value Ref Range Status   Specimen Description BLOOD RIGHT HAND  Final   Special Requests   Final    BOTTLES DRAWN AEROBIC ONLY Blood Culture results may not be optimal due to an inadequate volume of blood received in culture bottles   Culture   Final    NO GROWTH 3 DAYS Performed at [facility], [address], [place], [place] [address]    Report Status PENDING  Incomplete  Blood culture (routine x 2)     Status: None (Preliminary result)   Collection Time: 03/18/18  8:15 PM  Result Value Ref Range Status   Specimen Description BLOOD LEFT FOREARM  Final   Special Requests   Final    BOTTLES DRAWN AEROBIC AND ANAEROBIC Blood Culture results may not be optimal due to an inadequate volume of blood received in culture bottles   Culture   Final    NO GROWTH 3 DAYS Performed at [facility], [place] [address] [place], [place] [address]    Report Status PENDING  Incomplete  Urine culture      Status: Abnormal   Collection Time: 03/18/18 10:08 PM  Result Value Ref Range Status   Specimen Description URINE, CLEAN CATCH  Final   Special Requests   Final    NONE Performed at [facility], [place] [address] [place], [place] [address]    Culture (A)  Final    20,000 COLONIES/mL KLEBSIELLA PNEUMONIAE 20,000 COLONIES/mL ENTEROCOCCUS FAECALIS    Report Status 03/21/2018 FINAL  Final   Organism ID, Bacteria KLEBSIELLA PNEUMONIAE (A)  Final   Organism ID, Bacteria ENTEROCOCCUS FAECALIS (A)  Final      Susceptibility   Enterococcus faecalis - MIC*    AMPICILLIN <=2 SENSITIVE Sensitive     LEVOFLOXACIN 0.5 SENSITIVE Sensitive     NITROFURANTOIN <=16 SENSITIVE Sensitive     VANCOMYCIN 1 SENSITIVE Sensitive     * 20,000 COLONIES/mL ENTEROCOCCUS FAECALIS   Klebsiella pneumoniae - MIC*    AMPICILLIN >=32 RESISTANT Resistant     CEFAZOLIN <=4 SENSITIVE Sensitive     CEFTRIAXONE <=1 SENSITIVE Sensitive     CIPROFLOXACIN 1 SENSITIVE Sensitive     GENTAMICIN <=1 SENSITIVE Sensitive     IMIPENEM <=0.25 SENSITIVE Sensitive     NITROFURANTOIN 128 RESISTANT Resistant     TRIMETH/SULFA <=20 SENSITIVE Sensitive     AMPICILLIN/SULBACTAM 8 SENSITIVE Sensitive     PIP/TAZO 8 SENSITIVE Sensitive     Extended ESBL NEGATIVE Sensitive     * 20,000 COLONIES/mL KLEBSIELLA PNEUMONIAE     Time coordinating discharge: 32  minutes  SIGNED:   Hosie Poisson, MD  [facility] 03/22/2018, 8:19 AM Pager   If 7PM-7AM, please contact night-coverage [email or web address] Password [email or web address]

## 2018-03-23 ENCOUNTER — Other Ambulatory Visit: Payer: Self-pay | Admitting: Pharmacist

## 2018-03-23 ENCOUNTER — Ambulatory Visit: Payer: Self-pay | Admitting: Pharmacist

## 2018-03-23 DIAGNOSIS — H43813 Vitreous degeneration, bilateral: Secondary | ICD-10-CM | POA: Diagnosis not present

## 2018-03-23 DIAGNOSIS — H3582 Retinal ischemia: Secondary | ICD-10-CM | POA: Diagnosis not present

## 2018-03-23 DIAGNOSIS — E113513 Type 2 diabetes mellitus with proliferative diabetic retinopathy with macular edema, bilateral: Secondary | ICD-10-CM | POA: Diagnosis not present

## 2018-03-23 DIAGNOSIS — H43392 Other vitreous opacities, left eye: Secondary | ICD-10-CM | POA: Diagnosis not present

## 2018-03-23 LAB — CULTURE, BLOOD (ROUTINE X 2)
Culture: NO GROWTH
Culture: NO GROWTH

## 2018-03-23 NOTE — Patient Outreach (Signed)
[facility]) Care Management  [place]  03/23/2018  Kemesha Mosey November 24, 1950 [id number]   Reason for referral: medicationassistance  Unsuccessful telephone call attempt #1 to patient.   HIPAA compliant voicemail left requesting a return call  Plan:  I will make another outreach attempt to patient within 3-4 business days  I will mail unsuccessful outreach letter  Regina Eck, PharmD, [place]  [phone]

## 2018-03-24 ENCOUNTER — Telehealth: Payer: Self-pay | Admitting: Family Medicine

## 2018-03-24 NOTE — Telephone Encounter (Signed)
Amy with [facility] called wanting to obtain PT verbal order as well as verbal for skilled nursing for patient. Please call back she did not state frequency and states verbal can be left on her VM. [phone]

## 2018-03-25 DIAGNOSIS — E039 Hypothyroidism, unspecified: Secondary | ICD-10-CM | POA: Diagnosis not present

## 2018-03-25 DIAGNOSIS — I10 Essential (primary) hypertension: Secondary | ICD-10-CM | POA: Diagnosis not present

## 2018-03-25 DIAGNOSIS — E1165 Type 2 diabetes mellitus with hyperglycemia: Secondary | ICD-10-CM | POA: Diagnosis not present

## 2018-03-25 NOTE — Telephone Encounter (Signed)
VM left for Susan [name], [facility] PT with [facility].   VO given for PT and SN.

## 2018-03-25 NOTE — Progress Notes (Signed)
fPatient ID: Susan [name], female   DOB: [birth year], 67 y.o.   MRN: [id number]   PCP: Dr Scot Dock  Cardiologist: Dr Ron Parker  Endocrinologist: Dr Bubba Camp  Primary HF Cardiologist: Dr. Haroldine Laws  Nephrology: Dr Hollie Salk  [facility] Patient  HPI: Susan [name] is a 67 year old with a history of RBBB, DM, Hypothyroid, diastolic heart failure, CAD MI 2000/2007 BMS 2007, TIA, obesity.    Admitted June 1st through June 3rd, 2017 with altered mental status, hypothermia, and weakness. Diuretics initially held and later restarted.   Admitted 2/27-07/28/17 for AKI. Torsemide was held and restarted at 60 mg BID. She had RHC 07/24/17 that showed mild PAH with evidence of RV strain likely due to OHS/OSA. She was discharged to SNF.  Admitted 05/7508 with A/C diastolic HF. Diuresed with IV lasix.   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   Admitted 10/11-10/17/19 with LUE cellulitis. Treated with IV abx. Transitioned to oral antibiotics.   Admitted 10/24-10/27/19 with syncope. She was orthostatic. Had AKI with creatinine up to 3.33 and required IVF. Renal US showed no hydronephrosis. Losartan and imdur were DC'd. Creatinine 1.76 on day of DC.   She returns today for post hospital f/u. Overall doing better. No longer having dizziness. Denies SOB, but has not been very active due to left toe amputation. She chronically sleeps with 3 pillows. Denied PND. Has some BLE edema that comes and goes. No fever or chills. Eating at home more now. Does not have a scale at home, but weight is down nearly 30 lbs from her last visit here 2 months ago. Taking all medications, but has been taking torsemide 60 mg BID instead of prescribed 80 mg BID due to difficulty getting up to the bathroom. She has not been taking metolazone.   ECHO 07/11/2015- 50-55%.  Grade I DD Echo 07/23/17: EF 55-60%, grade 1 DD Echo 03/19/18: EF 55-60%, grade 1 DD  RHC 07/24/17: RA = 17 RV = 48/15 PA = 49/11 (30) PCW =  18 Fick cardiac output/index = 7.0/3.0 PVR = 0.5 WU Ao sat = 97% PA sat = 62%, 64% 1. Normal left-sided pressures 2. Mild PAH with evidence of RV strain likely due to OHS/OSA 3. Normal cardiac output  RHC 09/22/13: RA = 4  RV = 30/5/7  PA = 25/10 (16)  PCW = 7  Fick cardiac output/index = 6.3/2.7  PVR = 1.5 WU   FA sat = 93%  PA sat = 61%, 66%   ABI 11/04/2017 Right: The right toe-brachial index is normal. RT great toe pressure = 127 mmHg. Although ankle brachial indices are within normal limits (0.95-1.29), arterial Doppler waveforms at the ankle suggest some component of arterial occlusive disease. Left: The left toe-brachial index is abnormal. LT Great toe pressure = 115 mmHg. Although ankle brachial indices are within normal limits (0.95-1.29), arterial Doppler waveforms at the ankle suggest some component of arterial occlusive disease.  SH: Former smoker quit 15 year ago. Does drink alcohol. She is disabled. Lives at home with her husband and disabled son - Husband is truck Geophysicist/field seismologist. Son has a brain injury after he attempted suicide  A few years ago. .    FH: Mom MI  Review of systems complete and found to be negative unless listed in HPI.   Past Medical History:  Diagnosis Date  . Acute MI ([place]) 1999; 2007  . Anemia    hx  . Anginal pain ([place])   . Anxiety   .  ARF (acute renal failure) ([place]) 06/2017   Noank Kidney Asso  . Arthritis    "generalized" (03/15/2014)  . CAD (coronary artery disease)    MI in 2000 - MI  2007 - treated bare metal stent (no nuclear since then as 9/11)  . Carotid artery disease ([place])   . CHF (congestive heart failure) ([place])   . Chronic diastolic heart failure (HCC)    a) ECHO (08/2013) EF 55-60% and RV function nl b) RHC (08/2013) RA 4, RV 30/5/7, PA 25/10 (16), PCWP 7, Fick CO/CI 6.3/2.7, PVR 1.5 WU, PA 61 and 66%  . Daily headache    "~ every other day; since I fell in June" (03/15/2014)  . Depression   . Dyslipidemia   . Dyspnea   .  Exertional shortness of breath   . HTN (hypertension)   . Hypothyroidism   . Neuropathy   . Obesity   . Osteoarthritis   . Peripheral neuropathy   . PONV (postoperative nausea and vomiting)   . RBBB (right bundle branch block)    Old  . Stroke [facility])    mini strokes  . Syncope    likely due to low blood sugar  . Tachycardia    Sinus tachycardia  . Type II diabetes mellitus (HCC)    Type II  . Urinary incontinence   . Venous insufficiency     Current Outpatient Medications  Medication Sig Dispense Refill  . aspirin EC 81 MG tablet Take 81 mg by mouth daily.    . carvedilol (COREG) 12.5 MG tablet TAKE 1 TABLET BY MOUTH TWICE A DAY WITH A MEAL 90 tablet 1  . cholecalciferol (VITAMIN D) 1000 units tablet Take 1,000 Units by mouth daily.    . clopidogrel (PLAVIX) 75 MG tablet TAKE 1 TABLET BY MOUTH DAILY WITH BREAKFAST. 90 tablet 2  . escitalopram (LEXAPRO) 20 MG tablet Take 1 tablet (20 mg total) by mouth daily. 90 tablet 2  . ferrous sulfate 325 (65 FE) MG tablet Take 325 mg by mouth daily with breakfast. Reported on 08/21/2015    . Insulin Human (INSULIN PUMP) SOLN Inject into the skin.    Marland Kitchen insulin NPH Human (HUMULIN N,NOVOLIN N) 100 UNIT/ML injection Inject 15 Units into the skin every morning. Using insulin in Omnipod insulin pump system    . insulin regular (NOVOLIN R,HUMULIN R) 100 units/mL injection Inject 5-15 Units into the skin See admin instructions. Sliding scale 80-150 5 units, 150-200 7 units, 201-250 10 units, 251-300 12 units, 301-400 15 units.    Marland Kitchen levothyroxine (SYNTHROID, LEVOTHROID) 50 MCG tablet Take 1 tablet (50 mcg total) by mouth daily before breakfast. 90 tablet 1  . Magnesium Oxide 400 (240 Mg) MG TABS TAKE 1 TABLET BY MOUTH EVERY DAY (Patient taking differently: TAKE 240 mg TABLET BY MOUTH EVERY DAY) 90 tablet 3  . metolazone (ZAROXOLYN) 2.5 MG tablet TAKE 1 TABLET BY MOUTH ONCE A WEEK. 10 tablet 1  . nitroGLYCERIN (NITROSTAT) 0.4 MG SL tablet PLACE 1 TABLET  (0.4 MG TOTAL) UNDER THE TONGUE EVERY 5 (FIVE) MINUTES AS NEEDED FOR CHEST PAIN. 25 tablet 1  . ONETOUCH VERIO test strip     . pregabalin (LYRICA) 150 MG capsule Take 1 capsule (150 mg total) by mouth 2 (two) times daily. 60 capsule 2  . rosuvastatin (CRESTOR) 20 MG tablet TAKE 1 TABLET BY MOUTH EVERY DAY 90 tablet 3  . torsemide (DEMADEX) 20 MG tablet Take 80 mg by mouth 2 (two) times daily.    Marland Kitchen  vitamin E (VITAMIN E) 1000 UNIT capsule Take 1,000 Units by mouth daily.     No current facility-administered medications for this encounter.     Vitals:   03/30/18 1401  BP: 124/62  Pulse: 78  SpO2: 98%  Weight: 116 kg (255 lb 12.8 oz)   Wt Readings from Last 3 Encounters:  03/30/18 116 kg (255 lb 12.8 oz)  03/29/18 117 kg (258 lb)  03/19/18 117.1 kg (258 lb 2.5 oz)    PHYSICAL EXAM: General:  No resp difficulty. HEENT: Normal Neck: Supple. JVP 5-6. Carotids 2+ bilat; no bruits. No thyromegaly or nodule noted. Cor: PMI nondisplaced. RRR, No M/G/R noted Lungs: CTAB, normal effort. Abdomen: Soft, non-tender, non-distended, no HSM. No bruits or masses. +BS  Extremities: No cyanosis, clubbing, or rash. R and LLE nonpitting edema. Right foot in boot.  Neuro: Alert & orientedx3, cranial nerves grossly intact. moves all 4 extremities w/o difficulty. Affect pleasant   ASSESSMENT & PLAN: 1. Chronic Diastolic Heart Failure: NICM, Echo 06/2017: EF 55-60%, grade 1 DD - Echo 03/19/18: EF 55-60%, grade 1 DD - NYHA IIIb.  - Volume status okay on exam. Weight is down nearly 30 lbs from last visit in September. - Continue torsemide 60 mg BID. She is not taking metolazone weekly.  - Will not add spiro with recent AKI. Check BMET today. - Discussed fluid restrictions and low salt food choices.   2.  Uncontrolled DM:  - Follows closely with Dr Bubba Camp    3. CAD, angina - No s/s ischemia. - Continue statin and asa - Patient has a history of CAD with BMS in 2007.   - She had a low risk stress  test 01/2017 - Continue Coreg 12.5 mg BID  4. Obesity: - Body mass index is 41.29 kg/m.  - Discussed portion control.   6. HTN - Losartan DCd during recent hospitalization with AKI and syncope. BP stable today.   7. CKD Stage III - Followed by Dr Hollie Salk. Seen recently with no changes. Plans to follow up in 2 months.   8. Day time fatigue - Refuses sleep study.   9. 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 Able to put light weight on right foot now.   BMET Provided a scale Follow up in 2 months  Georgiana Shore, NP 2:51 PM  Greater than 50% of the 25 minute visit was spent in counseling/coordination of care regarding disease state education, salt/fluid restriction, sliding scale diuretics, and medication compliance.

## 2018-03-26 ENCOUNTER — Ambulatory Visit: Payer: Self-pay | Admitting: Pharmacist

## 2018-03-26 ENCOUNTER — Other Ambulatory Visit: Payer: Self-pay | Admitting: Pharmacist

## 2018-03-26 NOTE — Patient Outreach (Signed)
[facility] Care Management  [place]   03/26/2018  Susan [name] June 03, 1950 [id number]  Reason for referral: medication assistance/reconciliation  Referral source: [facility] CM NP Referral medication(s): insulin Omnipod system Current insurance: HTA  PMHx: CHF, DMT2, HTN, PVD, hypothyroidism, anemia, MDD, recent amputations  HPI:  60 yoF referred to [place] for medication assistance.  Patient states she is doing "okay", but she is having trouble affording her insulin pods.  Patient is able to described pod and system is great detail.  She is very knowldegable about her insulin regimen as well.  She states she is now in the coverage gap and is quoted to pay over $800 for pod system this month.  She is responsible for 20% until out of coverage gap.  All other medications are affordable for her at this time.  Objective: Allergies  Allergen Reactions  . Codeine Nausea And Vomiting    Medications Reviewed Today    Reviewed by Lavera Guise, [facility] (Pharmacist) on 03/26/18 at [place] List Status: <None>  Medication Order Taking? Sig Documenting Provider Last Dose Status Informant  allopurinol (ZYLOPRIM) 100 MG tablet [id number] No Take 1 tablet (100 mg total) by mouth 2 (two) times daily. Flora Lipps, MD 03/18/2018 Unknown time Active Self  aspirin EC 81 MG tablet [id number] No Take 81 mg by mouth daily. [provider] 03/18/2018 Unknown time Active Self  carvedilol (COREG) 12.5 MG tablet [id number] No TAKE 1 TABLET BY MOUTH TWICE A DAY WITH A MEAL  Patient taking differently:  Take 12.5 mg by mouth 2 (two) times daily with a meal.    Alycia Rossetti, MD 03/18/2018 0930 Active Self  cholecalciferol (VITAMIN D) 1000 units tablet [id number] No Take 1,000 Units by mouth daily. [provider] 03/18/2018 Unknown time Active Self  clopidogrel (PLAVIX) 75 MG tablet [id number] No TAKE 1 TABLET BY MOUTH DAILY WITH BREAKFAST.  Patient taking  differently:  Take 75 mg by mouth daily.    Larey Dresser, MD 03/18/2018 0930 Active Self  colchicine 0.6 MG tablet [id number] No Take 1 tablet (0.6 mg total) by mouth daily. Flora Lipps, MD 03/17/2018 Unknown time Active Self  escitalopram (LEXAPRO) 20 MG tablet [id number] No Take 1 tablet (20 mg total) by mouth daily. Vic Blackbird F, MD 03/18/2018 Unknown time Active Self  ferrous sulfate 325 (65 FE) MG tablet [id number] No Take 325 mg by mouth daily with breakfast. Reported on 08/21/2015 [provider] 03/18/2018 Unknown time Active Self           Med Note Owens Shark, JASMINE Q   Mon May 12, 2016  1:52 PM)    HYDROcodone-acetaminophen (NORCO/VICODIN) 5-325 MG tablet [id number] No Take 1 tablet by mouth every 4 (four) hours as needed for moderate pain.  Patient not taking:  Reported on 03/18/2018   Newt Minion, MD Not Taking Unknown time Active Self  Insulin Human (INSULIN PUMP) SOLN [id number] No Inject into the skin. [provider] unk Active Self  insulin NPH Human (HUMULIN N,NOVOLIN N) 100 UNIT/ML injection [id number] No Inject 15 Units into the skin every morning. [provider] 03/18/2018 Unknown time Active Self  insulin regular (NOVOLIN R,HUMULIN R) 100 units/mL injection [id number] No Inject 5-15 Units into the skin See admin instructions. Sliding scale 80-150 5 units, 150-200 7 units, 201-250 10 units, 251-300 12 units, 301-400 15 units. [provider] 03/18/2018 Unknown time Active Self  Med Note Wyonia Hough, LESLIE   Thu Mar 18, 2018  8:28 PM)    levothyroxine (SYNTHROID, LEVOTHROID) 50 MCG tablet [id number] No Take 1 tablet (50 mcg total) by mouth daily before breakfast. Dena Billet B, PA-C 03/18/2018 Unknown time Active Self  Magnesium Oxide 400 (240 Mg) MG TABS [id number] No TAKE 1 TABLET BY MOUTH EVERY DAY  Patient taking differently:  TAKE 240 mg TABLET BY MOUTH EVERY DAY   [name], MD 03/18/2018 Unknown time Active  Self  metolazone (ZAROXOLYN) 2.5 MG tablet [id number] No TAKE 1 TABLET BY MOUTH ONCE A WEEK. Bensimhon, Shaune Pascal, MD unk Active Self           Med Note Romona Curls   Wed Feb 24, 2018  2:47 PM) Per patient, she takes this either Wed or Thurs depending on her MD appts  nitroGLYCERIN (NITROSTAT) 0.4 MG SL tablet [id number] No PLACE 1 TABLET (0.4 MG TOTAL) UNDER THE TONGUE EVERY 5 (FIVE) MINUTES AS NEEDED FOR CHEST PAIN. [name], PA-C unk Active Self           Med Note Adora Fridge   Tue Jan 13, 2017  2:23 PM)    [facility] VERIO test strip [id number] No  [provider] Taking Active Self  pregabalin (LYRICA) 150 MG capsule [id number] No Take 1 capsule (150 mg total) by mouth 2 (two) times daily. Alycia Rossetti, MD 03/18/2018 Unknown time Active Self  rosuvastatin (CRESTOR) 20 MG tablet [id number] No TAKE 1 TABLET BY MOUTH EVERY DAY  Patient taking differently:  Take 20 mg by mouth daily.    Vic Blackbird F, MD 03/18/2018 Unknown time Active Self  torsemide (DEMADEX) 20 MG tablet [id number] No Take 80 mg by mouth 2 (two) times daily. [provider] 03/18/2018 Unknown time Active Self  vitamin E (VITAMIN E) 1000 UNIT capsule [id number] No Take 1,000 Units by mouth daily. [provider] 03/18/2018 Unknown time Active Self          Assessment:  Drugs sorted by system:  Neurologic/Psychologic: Lexapro, pregabalin  Cardiovascular: carvedilol, torsemide, metolazone, aspirin, clopidogrel, rosuvatstatin nitroglycerin  Endocrine: Novolin R (insulin pump-Omnipod system), levothyroxine  Pain: hydrocodone/APAP  Vitamins/Minerals/Supplements: vit D, ferrous sulfate, magnesium, vit E  Miscellaneous: allopurinol, colchicine  Medication Review Findings:  . Medications reconciled in [facility] per newest d/c summary-->patient verbalizes understanding  Medication Assistance Findings:  Extra Help:   '[]'  Already receiving Full Extra Help  '[]'  Already receiving Partial  Extra Help  '[]'  Eligible based on reported income and assets  '[x]'  Not Eligible based on reported income and assets  Patient Assistance Programs: 1) Omnipods made by [employer] requirement met: '[]'  Yes '[]'  No '[]'  Unknown Company only pays up to 30% of device/copay and would have to shipped directly via company.  Application process is 5-7 days.  Phone number was given to patient    Plan: -I will follow up with patient next week once HTA insurance benefits for Insulin Omnipods is deteremined -I will follow up with patient within 1 week to determine if [place] home visit is necessary for BP monitor teaching  Regina Eck, PharmD, [place]  [phone]

## 2018-03-29 ENCOUNTER — Ambulatory Visit (INDEPENDENT_AMBULATORY_CARE_PROVIDER_SITE_OTHER): Payer: PPO | Admitting: Orthopedic Surgery

## 2018-03-29 ENCOUNTER — Other Ambulatory Visit: Payer: Self-pay | Admitting: Pharmacist

## 2018-03-29 ENCOUNTER — Encounter (INDEPENDENT_AMBULATORY_CARE_PROVIDER_SITE_OTHER): Payer: Self-pay | Admitting: Orthopedic Surgery

## 2018-03-29 ENCOUNTER — Telehealth: Payer: Self-pay | Admitting: *Deleted

## 2018-03-29 ENCOUNTER — Ambulatory Visit: Payer: Self-pay | Admitting: Pharmacist

## 2018-03-29 VITALS — Ht 66.0 in | Wt 258.0 lb

## 2018-03-29 DIAGNOSIS — Z89411 Acquired absence of right great toe: Secondary | ICD-10-CM

## 2018-03-29 DIAGNOSIS — I5033 Acute on chronic diastolic (congestive) heart failure: Secondary | ICD-10-CM | POA: Diagnosis not present

## 2018-03-29 DIAGNOSIS — R2681 Unsteadiness on feet: Secondary | ICD-10-CM | POA: Diagnosis not present

## 2018-03-29 DIAGNOSIS — M6281 Muscle weakness (generalized): Secondary | ICD-10-CM | POA: Diagnosis not present

## 2018-03-29 DIAGNOSIS — R0602 Shortness of breath: Secondary | ICD-10-CM | POA: Diagnosis not present

## 2018-03-29 DIAGNOSIS — N179 Acute kidney failure, unspecified: Secondary | ICD-10-CM | POA: Diagnosis not present

## 2018-03-29 DIAGNOSIS — E1165 Type 2 diabetes mellitus with hyperglycemia: Secondary | ICD-10-CM | POA: Diagnosis not present

## 2018-03-29 DIAGNOSIS — N183 Chronic kidney disease, stage 3 (moderate): Secondary | ICD-10-CM | POA: Diagnosis not present

## 2018-03-29 DIAGNOSIS — I2510 Atherosclerotic heart disease of native coronary artery without angina pectoris: Secondary | ICD-10-CM | POA: Diagnosis not present

## 2018-03-29 DIAGNOSIS — R262 Difficulty in walking, not elsewhere classified: Secondary | ICD-10-CM | POA: Diagnosis not present

## 2018-03-29 DIAGNOSIS — S98111A Complete traumatic amputation of right great toe, initial encounter: Secondary | ICD-10-CM

## 2018-03-29 DIAGNOSIS — R269 Unspecified abnormalities of gait and mobility: Secondary | ICD-10-CM | POA: Diagnosis not present

## 2018-03-29 DIAGNOSIS — S98131A Complete traumatic amputation of one right lesser toe, initial encounter: Secondary | ICD-10-CM | POA: Diagnosis not present

## 2018-03-29 NOTE — Patient Outreach (Signed)
[name]) Care Management  03/29/2018  Susan [name] 1951/01/05 [id number]  PMHx: CHF, DMT2, HTN, PVD, hypothyroidism, anemia, MDD, & recent amputations  HPI:  [name] recently discharged from the hospital on 03/21/18 and referred for post-discharge medication review.  Successful outreach to [name] with HIPAA identifiers verified x2.  Patient states that she is doing "better" since she has been home versus in the hospital.  She states several medications were discontinued and some were changed.  Patient agreeable to review medications telephonically.  Medications Reviewed Today    Reviewed by Lavera Guise, [facility] (Pharmacist) on 03/29/18 at [place] List Status: <None>  Medication Order Taking? Sig Documenting Provider Last Dose Status Informant  allopurinol (ZYLOPRIM) 100 MG tablet [id number] Yes Take 1 tablet (100 mg total) by mouth 2 (two) times daily. Pokhrel, Laxman, MD Taking Active Self  aspirin EC 81 MG tablet [id number] Yes Take 81 mg by mouth daily. [provider] Taking Active Self  carvedilol (COREG) 12.5 MG tablet [id number] Yes TAKE 1 TABLET BY MOUTH TWICE A DAY WITH A MEAL  Patient taking differently:  Take 12.5 mg by mouth 2 (two) times daily with a meal.    [name], [name], MD Taking Active Self  cholecalciferol (VITAMIN D) 1000 units tablet [id number] Yes Take 1,000 Units by mouth daily. [provider] Taking Active Self  clopidogrel (PLAVIX) 75 MG tablet [id number] Yes TAKE 1 TABLET BY MOUTH DAILY WITH BREAKFAST.  Patient taking differently:  Take 75 mg by mouth daily.    Larey Dresser, MD Taking Active Self  colchicine 0.6 MG tablet [id number] Yes Take 1 tablet (0.6 mg total) by mouth daily. Pokhrel, Laxman, MD Taking Active Self  escitalopram (LEXAPRO) 20 MG tablet [id number] Yes Take 1 tablet (20 mg total) by mouth daily. [place], [name], MD Taking Active Self  ferrous sulfate 325 (65 FE) MG tablet [id number] Yes Take 325 mg by  mouth daily with breakfast. Reported on 08/21/2015 [provider] Taking Active Self           Med Note Owens Shark, JASMINE Q   Mon May 12, 2016  1:52 PM)    HYDROcodone-acetaminophen (NORCO/VICODIN) 5-325 MG tablet [id number] Yes Take 1 tablet by mouth every 4 (four) hours as needed for moderate pain. Newt Minion, MD Taking Active Self  Insulin Human (INSULIN PUMP) SOLN [id number] Yes Inject into the skin. [provider] Taking Active Self  insulin NPH Human (HUMULIN N,NOVOLIN N) 100 UNIT/ML injection [id number] Yes Inject 15 Units into the skin every morning. Using insulin in Omnipod insulin pump system [provider] Taking Active Self  insulin regular (NOVOLIN R,HUMULIN R) 100 units/mL injection [id number] Yes Inject 5-15 Units into the skin See admin instructions. Sliding scale 80-150 5 units, 150-200 7 units, 201-250 10 units, 251-300 12 units, 301-400 15 units. [provider] Taking Active Self           Med Note Birdena Crandall Mar 18, 2018  8:28 PM)    levothyroxine (SYNTHROID, LEVOTHROID) 50 MCG tablet [id number] Yes Take 1 tablet (50 mcg total) by mouth daily before breakfast. [name], PA-C Taking Active Self  Magnesium Oxide 400 (240 Mg) MG TABS [id number] Yes TAKE 1 TABLET BY MOUTH EVERY DAY  Patient taking differently:  TAKE 240 mg TABLET BY MOUTH EVERY DAY   Susan Frizzle, MD Taking Active Self  metolazone (ZAROXOLYN) 2.5 MG tablet [id number] No TAKE 1 TABLET BY MOUTH  ONCE A WEEK.  Patient not taking:  Reported on 03/29/2018   Bensimhon, Shaune Pascal, MD Not Taking Active Self           Med Note Shara Blazing Mar 29, 2018  8:56 AM) Holding until f/u with nephrology/PCP  nitroGLYCERIN (NITROSTAT) 0.4 MG SL tablet [id number] Yes PLACE 1 TABLET (0.4 MG TOTAL) UNDER THE TONGUE EVERY 5 (FIVE) MINUTES AS NEEDED FOR CHEST PAIN. [name], PA-C Taking Active Self           Med Note Adora Fridge   Tue Jan 13, 2017  2:23 PM)     [facility] VERIO test strip [id number] Yes  [provider] Taking Active Self  pregabalin (LYRICA) 150 MG capsule [id number] Yes Take 1 capsule (150 mg total) by mouth 2 (two) times daily. [name], [name], MD Taking Active Self  rosuvastatin (CRESTOR) 20 MG tablet [id number] Yes TAKE 1 TABLET BY MOUTH EVERY DAY  Patient taking differently:  Take 20 mg by mouth daily.    [place], [name], MD Taking Active Self  torsemide (DEMADEX) 20 MG tablet [id number] Yes Take 80 mg by mouth 2 (two) times daily. [provider] Taking Active Self  vitamin E (VITAMIN E) 1000 UNIT capsule [id number] Yes Take 1,000 Units by mouth daily. [provider] Taking Active Self          ASSESSMENT: Date Discharged from Hospital: 03/21/18 Date Medication Reconciliation Performed: 03/29/2018  Medications Discontinued at Discharge:  losaratan  metolazone  ondansetron  promethazine  isosorbide  New Medications at Discharge:   n/a  No new medications were prescribed at discharge.  Patient was recently discharged from hospital and all medications have been reviewed   Drugs sorted by system:  Neurologic/Psychologic: pregabalin, escitalopram  Cardiovascular: torsemide, rosuvastatin, nitroglycerin, aspirin, carvedilol, clopidogrel  Endocrine: Insulin pump-->Novolin R, levothyroxine  Pain: hydrocodone/APAP  Vitamins/Minerals: vit E, magnesium, ferrous sulfate, vitD  Miscellaneous: colchicine, allopurinol  Drug interactions: The combination of a statin with colchicine may result in an increased potential (greater than for each agent alone) for myopathy.  Patient counseled on side effects and aware of drug interaction.  The benefit may outweigh the risk in this situation.   PLAN: -Instructed patient to take new medications as prescribed and discontinue old medications as prescribed   Regina Eck, PharmD, [place]   [phone]

## 2018-03-29 NOTE — Progress Notes (Signed)
Office Visit Note   Patient: Susan [name]           Date of Birth: [birth year]           MRN: [id number] Visit Date: 03/29/2018              Requested by: Alycia Rossetti, MD [address] [place], [place] [address] PCP: Alycia Rossetti, MD  Chief Complaint  Patient presents with  . Right Foot - Routine Post Op    02/24/18 right GT and 2nd toe amputation       HPI: Patient is a 67 year old woman who presents 4 weeks status post great toe and second toe amputation she does have venous stasis swelling with brawny skin color changes she was wearing medical compression stockings.  Assessment & Plan: Visit Diagnoses:  1. Amputated great toe, right ([place])     Plan: Patient may advance to full weightbearing continue with his stockings she was given a spacer to place between the fourth and third toe to prevent ulceration on the third toe.  Discussed the importance of elevation and compression to help decrease the venous swelling.  Follow-Up Instructions: Return in about 4 weeks (around 04/26/2018).   Ortho Exam  Patient is alert, oriented, no adenopathy, well-dressed, normal affect, normal respiratory effort. Examination she has brawny swelling but no venous ulcer or drainage.  The surgical incision is well-healed she has superficial abrasion on the third toe but no open ulcer.  Imaging: No results found. No images are attached to the encounter.  Labs: Lab Results  Component Value Date   HGBA1C 9.3 (H) 09/25/2017   HGBA1C 11.4 (H) 07/16/2017   HGBA1C 10.2 (H) 02/27/2017   CRP 8.4 (H) 03/05/2018   LABURIC 9.0 (H) 03/06/2018   REPTSTATUS 03/21/2018 FINAL 03/18/2018   CULT (A) 03/18/2018    20,000 COLONIES/mL KLEBSIELLA PNEUMONIAE 20,000 COLONIES/mL ENTEROCOCCUS FAECALIS    LABORGA KLEBSIELLA PNEUMONIAE (A) 03/18/2018   LABORGA ENTEROCOCCUS FAECALIS (A) 03/18/2018     Lab Results  Component Value Date   ALBUMIN 3.0 (L) 03/19/2018   ALBUMIN 3.8 03/18/2018   ALBUMIN 2.6 (L) 02/14/2018   LABURIC 9.0 (H) 03/06/2018    Body mass index is 41.64 kg/m.  Orders:  No orders of the defined types were placed in this encounter.  No orders of the defined types were placed in this encounter.    Procedures: No procedures performed  Clinical Data: No additional findings.  ROS:  All other systems negative, except as noted in the HPI. Review of Systems  Objective: Vital Signs: Ht 5\' 6"  (1.676 m)   Wt 258 lb (117 kg)   BMI 41.64 kg/m   Specialty Comments:  No specialty comments available.  PMFS History: Patient Active Problem List   Diagnosis Date Noted  . Pressure injury of skin 03/19/2018  . Syncope 03/18/2018  . Cellulitis of hand, left 03/06/2018  . Cellulitis 03/06/2018  . Amputated great toe, right ([place]) 02/24/2018  . Osteomyelitis of second toe of right foot ([place])   . Osteomyelitis of great toe of right foot ([place])   . 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 ([place])   . Acute on chronic diastolic (congestive) heart failure ([place]) 02/13/2018  . PVD (peripheral vascular disease) ([place]) 10/26/2017  . Hypothyroid 07/27/2017  . PAH (pulmonary artery hypertension) ([place])   . Impaired ambulation 07/19/2017  . Hyperosmolar non-ketotic state in patient with type 2 diabetes mellitus ([place]) 07/15/2017  . Leg cramps  02/27/2017  . Peripheral edema 01/12/2017  . Diabetic neuropathy ([place]) 11/12/2016  . 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 mellitus ([place]) 10/24/2015  . Anemia 10/03/2015  . Generalized anxiety disorder 10/03/2015  . Insomnia 10/03/2015  . Diabetes mellitus type II, uncontrolled ([place]) 06/07/2015  . Chronic diastolic CHF (congestive heart failure) ([place]) 06/07/2015  . Non compliance with medical treatment 04/17/2014  . Rotator cuff tear 03/14/2014  . Morbid obesity ([place]) 09/23/2013  . Hypotension 12/25/2012  . Urinary incontinence   . MDD (major depressive disorder) 11/12/2010  . RBBB (right bundle branch  block)   . CAD (coronary artery disease)   . Hyperlipemia 01/22/2009  . Essential hypertension 01/22/2009   Past Medical History:  Diagnosis Date  . Acute MI ([place]) 1999; 2007  . Anemia    hx  . Anginal pain ([place])   . Anxiety   . ARF (acute renal failure) ([place]) 06/2017   [facility]  . Arthritis    "generalized" (03/15/2014)  . CAD (coronary artery disease)    MI in 2000 - MI  2007 - treated bare metal stent (no nuclear since then as 9/11)  . Carotid artery disease ([place])   . CHF (congestive heart failure) ([place])   . Chronic diastolic heart failure (HCC)    a) ECHO (08/2013) EF 55-60% and RV function nl b) RHC (08/2013) RA 4, RV 30/5/7, PA 25/10 (16), PCWP 7, Fick CO/CI 6.3/2.7, PVR 1.5 WU, PA 61 and 66%  . Daily headache    "~ every other day; since I fell in June" (03/15/2014)  . Depression   . Dyslipidemia   . Dyspnea   . Exertional shortness of breath   . HTN (hypertension)   . Hypothyroidism   . Neuropathy   . Obesity   . Osteoarthritis   . Peripheral neuropathy   . PONV (postoperative nausea and vomiting)   . RBBB (right bundle branch block)    Old  . Stroke [facility])    mini strokes  . Syncope    likely due to low blood sugar  . Tachycardia    Sinus tachycardia  . Type II diabetes mellitus (HCC)    Type II  . Urinary incontinence   . Venous insufficiency     Family History  Problem Relation Age of Onset  . Heart attack Mother 67    Past Surgical History:  Procedure Laterality Date  . ABDOMINAL HYSTERECTOMY  1980's  . AMPUTATION Right 02/24/2018   Procedure: RIGHT FOOT GREAT TOE AND 2ND TOE AMPUTATION;  Surgeon: Newt Minion, MD;  Location: [facility];  Service: Orthopedics;  Laterality: Right;  . CATARACT EXTRACTION, BILATERAL Bilateral ?2013  . COLONOSCOPY W/ POLYPECTOMY    . CORONARY ANGIOPLASTY WITH STENT PLACEMENT  1999; 2007   "1 + 1"  . EYE SURGERY Bilateral    [facility]  . KNEE ARTHROSCOPY Left 10/25/2006  . RIGHT HEART CATH N/A 07/24/2017    Procedure: RIGHT HEART CATH;  Surgeon: Jolaine Artist, MD;  Location: [facility] CV LAB;  Service: Cardiovascular;  Laterality: N/A;  . RIGHT HEART CATHETERIZATION N/A 09/22/2013   Procedure: RIGHT HEART CATH;  Surgeon: Jolaine Artist, MD;  Location: [facility] CATH LAB;  Service: Cardiovascular;  Laterality: N/A;  . SHOULDER ARTHROSCOPY WITH OPEN ROTATOR CUFF REPAIR Right 03/14/2014   Procedure: RIGHT SHOULDER ARTHROSCOPY WITH BICEPS RELEASE, OPEN SUBSCAPULA REPAIR, OPEN SUPRASPINATUS REPAIR.;  Surgeon: Meredith Pel, MD;  Location: [facility];  Service: Orthopedics;  Laterality: Right;  .  TOE AMPUTATION Right 02/24/2018   GREAT TOE AND 2ND TOE AMPUTATION  . TUBAL LIGATION  1970's   Social History   Occupational History    Employer: UNEMPLOYED  Tobacco Use  . Smoking status: Former Smoker    Packs/day: 3.00    Years: 32.00    Pack years: 96.00    Types: Cigarettes    Last attempt to quit: 10/24/1997    Years since quitting: 20.4  . Smokeless tobacco: Never Used  Substance and Sexual Activity  . Alcohol use: Not Currently    Comment: "might have 2-3 daiquiris in the summer"  . Drug use: No  . Sexual activity: Never

## 2018-03-29 NOTE — Telephone Encounter (Signed)
Received call from [name] [facility] SN with [place]. [phone] telephone.   Requested OV to extend [facility] SN visits 1x weekly x5 weeks for medication management, and DM.   VO given.

## 2018-03-29 NOTE — Patient Outreach (Addendum)
[facility]) Care Management  03/29/2018  Yarianna Varble Jan 17, 1951 [id number]   Successful care coordination outreach to HTA (patient's insurance) regarding patient's Omnipod insulin pump system.  Patient is currently in the coverage gap and is responsible for 25% of the cost (~$800 per patient & [facility] in [place]).  Of note, this Omnipod insulin system is covered under Medicare Part D with a NON Form exception per patient's insurance. Once a non-form exception is approved, the Omnipod becomes a Tier 4.  This exception may or may not be approved.  Patient may have to transition to subcutaneous insulin injections if unable to afford Omnipods.    Spoke with [employer] which is the company that [employer] system.  If the patient gets the Omnipod directly through them, she may be eligible for financial assistance up to 30% of her cost.  Patient was given number of the company and has all of this information at her home as given by her endocrinologist.  Also, discussed with patient about talking with her endocrinolgist about a plan when her pods run out (likely resume subcutaneous injections as she has done prior to Omnipods).  Patient is diligent about appointments.  Encouraged patient to take fluid pills and all other medications as prescribed.  Patient has been provided [facility] contact information.   PLAN: -Thank you for allowing [facility] pharmacy to be involved in this patient's care. -I will follow up with patient next week to assess medication management.  I will sign off at that time if no additional pharmacy services identified.   Regina Eck, PharmD, [place]  [phone]

## 2018-03-30 ENCOUNTER — Ambulatory Visit (HOSPITAL_COMMUNITY)
Admission: RE | Admit: 2018-03-30 | Discharge: 2018-03-30 | Disposition: A | Discharge status: Home / Self Care | Payer: PPO | Source: Ambulatory Visit | Attending: Cardiology | Admitting: Cardiology

## 2018-03-30 ENCOUNTER — Other Ambulatory Visit: Payer: Self-pay | Admitting: Licensed Clinical Social Worker

## 2018-03-30 ENCOUNTER — Encounter (HOSPITAL_COMMUNITY): Payer: Self-pay

## 2018-03-30 ENCOUNTER — Other Ambulatory Visit: Payer: Self-pay

## 2018-03-30 VITALS — BP 124/62 | HR 78 | Wt 255.8 lb

## 2018-03-30 DIAGNOSIS — E1140 Type 2 diabetes mellitus with diabetic neuropathy, unspecified: Secondary | ICD-10-CM | POA: Insufficient documentation

## 2018-03-30 DIAGNOSIS — I5032 Chronic diastolic (congestive) heart failure: Secondary | ICD-10-CM | POA: Diagnosis not present

## 2018-03-30 DIAGNOSIS — E039 Hypothyroidism, unspecified: Secondary | ICD-10-CM | POA: Insufficient documentation

## 2018-03-30 DIAGNOSIS — I872 Venous insufficiency (chronic) (peripheral): Secondary | ICD-10-CM | POA: Diagnosis not present

## 2018-03-30 DIAGNOSIS — B9689 Other specified bacterial agents as the cause of diseases classified elsewhere: Secondary | ICD-10-CM | POA: Diagnosis not present

## 2018-03-30 DIAGNOSIS — I428 Other cardiomyopathies: Secondary | ICD-10-CM | POA: Insufficient documentation

## 2018-03-30 DIAGNOSIS — Z8673 Personal history of transient ischemic attack (TIA), and cerebral infarction without residual deficits: Secondary | ICD-10-CM | POA: Insufficient documentation

## 2018-03-30 DIAGNOSIS — L03115 Cellulitis of right lower limb: Secondary | ICD-10-CM | POA: Diagnosis not present

## 2018-03-30 DIAGNOSIS — I10 Essential (primary) hypertension: Secondary | ICD-10-CM | POA: Diagnosis not present

## 2018-03-30 DIAGNOSIS — Z87891 Personal history of nicotine dependence: Secondary | ICD-10-CM | POA: Insufficient documentation

## 2018-03-30 DIAGNOSIS — F419 Anxiety disorder, unspecified: Secondary | ICD-10-CM | POA: Diagnosis not present

## 2018-03-30 DIAGNOSIS — I739 Peripheral vascular disease, unspecified: Secondary | ICD-10-CM | POA: Diagnosis not present

## 2018-03-30 DIAGNOSIS — Z89411 Acquired absence of right great toe: Secondary | ICD-10-CM | POA: Insufficient documentation

## 2018-03-30 DIAGNOSIS — F329 Major depressive disorder, single episode, unspecified: Secondary | ICD-10-CM | POA: Diagnosis not present

## 2018-03-30 DIAGNOSIS — I252 Old myocardial infarction: Secondary | ICD-10-CM | POA: Diagnosis not present

## 2018-03-30 DIAGNOSIS — E785 Hyperlipidemia, unspecified: Secondary | ICD-10-CM | POA: Insufficient documentation

## 2018-03-30 DIAGNOSIS — R5383 Other fatigue: Secondary | ICD-10-CM | POA: Diagnosis not present

## 2018-03-30 DIAGNOSIS — Z79890 Hormone replacement therapy (postmenopausal): Secondary | ICD-10-CM | POA: Insufficient documentation

## 2018-03-30 DIAGNOSIS — E1165 Type 2 diabetes mellitus with hyperglycemia: Secondary | ICD-10-CM | POA: Insufficient documentation

## 2018-03-30 DIAGNOSIS — N184 Chronic kidney disease, stage 4 (severe): Secondary | ICD-10-CM | POA: Diagnosis not present

## 2018-03-30 DIAGNOSIS — Z7902 Long term (current) use of antithrombotics/antiplatelets: Secondary | ICD-10-CM | POA: Insufficient documentation

## 2018-03-30 DIAGNOSIS — I4510 Unspecified right bundle-branch block: Secondary | ICD-10-CM | POA: Diagnosis not present

## 2018-03-30 DIAGNOSIS — Z79899 Other long term (current) drug therapy: Secondary | ICD-10-CM | POA: Insufficient documentation

## 2018-03-30 DIAGNOSIS — I2510 Atherosclerotic heart disease of native coronary artery without angina pectoris: Secondary | ICD-10-CM

## 2018-03-30 DIAGNOSIS — Z6841 Body Mass Index (BMI) 40.0 and over, adult: Secondary | ICD-10-CM | POA: Insufficient documentation

## 2018-03-30 DIAGNOSIS — E669 Obesity, unspecified: Secondary | ICD-10-CM | POA: Insufficient documentation

## 2018-03-30 DIAGNOSIS — I25119 Atherosclerotic heart disease of native coronary artery with unspecified angina pectoris: Secondary | ICD-10-CM | POA: Insufficient documentation

## 2018-03-30 DIAGNOSIS — I130 Hypertensive heart and chronic kidney disease with heart failure and stage 1 through stage 4 chronic kidney disease, or unspecified chronic kidney disease: Secondary | ICD-10-CM | POA: Diagnosis not present

## 2018-03-30 DIAGNOSIS — Z7982 Long term (current) use of aspirin: Secondary | ICD-10-CM | POA: Insufficient documentation

## 2018-03-30 DIAGNOSIS — N183 Chronic kidney disease, stage 3 (moderate): Secondary | ICD-10-CM | POA: Insufficient documentation

## 2018-03-30 DIAGNOSIS — E1122 Type 2 diabetes mellitus with diabetic chronic kidney disease: Secondary | ICD-10-CM | POA: Diagnosis not present

## 2018-03-30 DIAGNOSIS — Z794 Long term (current) use of insulin: Secondary | ICD-10-CM | POA: Insufficient documentation

## 2018-03-30 DIAGNOSIS — D631 Anemia in chronic kidney disease: Secondary | ICD-10-CM | POA: Diagnosis not present

## 2018-03-30 LAB — BASIC METABOLIC PANEL
Anion gap: 7 (ref 5–15)
BUN: 71 mg/dL — ABNORMAL HIGH (ref 8–23)
CO2: 26 mmol/L (ref 22–32)
Calcium: 9.1 mg/dL (ref 8.9–10.3)
Chloride: 103 mmol/L (ref 98–111)
Creatinine, Ser: 2.32 mg/dL — ABNORMAL HIGH (ref 0.44–1.00)
GFR calc Af Amer: 24 mL/min — ABNORMAL LOW (ref >60)
GFR calc non Af Amer: 20 mL/min — ABNORMAL LOW (ref >60)
Glucose, Bld: 334 mg/dL — ABNORMAL HIGH (ref 70–99)
Potassium: 4.3 mmol/L (ref 3.5–5.1)
Sodium: 136 mmol/L (ref 135–145)

## 2018-03-30 NOTE — Patient Outreach (Signed)
Assessment:  CSW spoke via phone with client. CSW verified client identity. CSW received verbal permission from client for CSW to speak with client about client needs. Client sees Dr. Sharol Given as scheduled. Client sees Dr. [name], primary care doctor as scheduled. Client has said that her spouse or her dauhghter helps with transport needs of client. Client does have a wheelchair at home to use as needed. Client is receiving [facility] pharmacist Royce Macadamia. [place]. CSW spoke with client about client care plan. CSW encourged client to continue to use relaxation techniques of choice in next 30 days to help T[place] in managing anxiety symptoms or depression symptoms experienced by client.Client said she is still using relaxation techniques to help her manage stress or depression symptoms.  Client has said she likes to visit with family and friends to help her relax.  She likes to watch old TV movies of her choice or spend time caring for her pet dog as ways to manage stress or depression symptoms.  Client has been receiving home health services as ordered in the home She has been receiving home health physical therapy sessions as scheduled. She has been receiving home health nursing services as scheduled. CSW thanked Audre for phone call with CSW. CSW encouraged client to call CSW as needed to discuss social work needs of client.    Plan:  Client to continue to use relaxation techniques of choice in next 30 days to help K[place] in managing anxiety symptoms or depression symptoms experienced by client.   CSW to call client in 4 weeks to assess client needs.   [name].[name] MSW, LCSW Licensed Clinical Social Worker [facility] Care Management [phone]

## 2018-03-30 NOTE — Patient Instructions (Signed)
CONTINUE Toresmide 60 mg twice a day If your weight increases 3 lbs overnight or 5 lbs in a week, please give our office a call for further instructions   Your physician has requested that you regularly monitor and record your blood pressure readings at home. Please use the same machine at the same time of day to check your readings and record them to bring to your follow-up visit. YOUR BP GOAL IS LESS THAN 130/80  Labs today We will only contact you if something comes back abnormal or we need to make some changes. Otherwise no news is good news!  Your physician recommends that you schedule a follow-up appointment in: 2 WEEKS  in the Advanced Practitioners (PA/NP) Clinic    Do the following things EVERYDAY: 1) Weigh yourself in the morning before breakfast. Write it down and keep it in a log. 2) Take your medicines as prescribed 3) Eat low salt foods-Limit salt (sodium) to 2000 mg per day.  4) Stay as active as you can everyday 5) Limit all fluids for the day to less than 2 liters

## 2018-04-01 NOTE — Addendum Note (Signed)
Addended by: Lottie Dawson D on: 04/01/2018 12:30 PM   Modules accepted: Orders

## 2018-04-02 ENCOUNTER — Telehealth (HOSPITAL_COMMUNITY): Payer: Self-pay | Admitting: Cardiology

## 2018-04-02 ENCOUNTER — Telehealth (INDEPENDENT_AMBULATORY_CARE_PROVIDER_SITE_OTHER): Payer: Self-pay | Admitting: Orthopedic Surgery

## 2018-04-02 DIAGNOSIS — I5032 Chronic diastolic (congestive) heart failure: Secondary | ICD-10-CM

## 2018-04-02 MED ORDER — TORSEMIDE 20 MG PO TABS: ORAL_TABLET | ORAL | 3 refills | Status: DC

## 2018-04-02 NOTE — Telephone Encounter (Signed)
-----   Message from Georgiana Shore, NP sent at 03/30/2018  4:25 PM EST ----- Renal function is elevated. Have her told torsemide x 2 days, then decrease to 60 mg am, 40 mg pm. Repeat BMET 1 week. Thanks!

## 2018-04-02 NOTE — Telephone Encounter (Signed)
Notes recorded by Kerry Dory, CMA on 04/02/2018 at 9:11 AM EST Patient aware. Patient voice understanding. Repeat labs 11/14   ------  Notes recorded by Kerry Dory, CMA on 03/31/2018 at 9:12 AM EST Unable to reach patient. Line busy x 2 [phone] (H)  ------  Notes recorded by Georgiana Shore, NP on 03/30/2018 at 4:25 PM EST Renal function is elevated. Have her told torsemide x 2 days, then decrease to 60 mg am, 40 mg pm. Repeat BMET 1 week. Thanks!

## 2018-04-02 NOTE — Telephone Encounter (Signed)
Annette from [facility] called requesting the patient's weight bearing status.  She is seeing her this morning and would like to know before she has her visit with her.  CB#[phone].  Thank you.

## 2018-04-02 NOTE — Telephone Encounter (Signed)
Called and sw [name] to advise that pt may advance to full weight bearing as per the last dictation.

## 2018-04-06 ENCOUNTER — Ambulatory Visit: Payer: Self-pay | Admitting: Pharmacist

## 2018-04-06 ENCOUNTER — Other Ambulatory Visit: Payer: Self-pay | Admitting: Pharmacist

## 2018-04-06 ENCOUNTER — Other Ambulatory Visit (HOSPITAL_COMMUNITY): Payer: Self-pay | Admitting: Internal Medicine

## 2018-04-06 NOTE — Patient Outreach (Signed)
[facility]) Care Management  [place]  04/06/2018  Evanell Redlich August 09, 1950 [id number]   Reason for referral: medication management  Unsuccessful telephone call attempt to patient.   HIPAA compliant voicemail left requesting a return call  Plan:  I will make another outreach attempt to patient within 3-4 business days   Regina Eck, PharmD, [place]  [phone]

## 2018-04-07 DIAGNOSIS — I130 Hypertensive heart and chronic kidney disease with heart failure and stage 1 through stage 4 chronic kidney disease, or unspecified chronic kidney disease: Secondary | ICD-10-CM | POA: Diagnosis not present

## 2018-04-07 DIAGNOSIS — N184 Chronic kidney disease, stage 4 (severe): Secondary | ICD-10-CM | POA: Diagnosis not present

## 2018-04-07 DIAGNOSIS — Z89421 Acquired absence of other right toe(s): Secondary | ICD-10-CM | POA: Diagnosis not present

## 2018-04-07 DIAGNOSIS — Z89411 Acquired absence of right great toe: Secondary | ICD-10-CM | POA: Diagnosis not present

## 2018-04-07 DIAGNOSIS — Z7982 Long term (current) use of aspirin: Secondary | ICD-10-CM | POA: Diagnosis not present

## 2018-04-07 DIAGNOSIS — Z4781 Encounter for orthopedic aftercare following surgical amputation: Secondary | ICD-10-CM | POA: Diagnosis not present

## 2018-04-07 DIAGNOSIS — E1142 Type 2 diabetes mellitus with diabetic polyneuropathy: Secondary | ICD-10-CM | POA: Diagnosis not present

## 2018-04-07 DIAGNOSIS — L03114 Cellulitis of left upper limb: Secondary | ICD-10-CM | POA: Diagnosis not present

## 2018-04-07 DIAGNOSIS — Z87891 Personal history of nicotine dependence: Secondary | ICD-10-CM | POA: Diagnosis not present

## 2018-04-07 DIAGNOSIS — Z794 Long term (current) use of insulin: Secondary | ICD-10-CM | POA: Diagnosis not present

## 2018-04-07 DIAGNOSIS — I5032 Chronic diastolic (congestive) heart failure: Secondary | ICD-10-CM | POA: Diagnosis not present

## 2018-04-07 DIAGNOSIS — E1122 Type 2 diabetes mellitus with diabetic chronic kidney disease: Secondary | ICD-10-CM | POA: Diagnosis not present

## 2018-04-07 DIAGNOSIS — E039 Hypothyroidism, unspecified: Secondary | ICD-10-CM | POA: Diagnosis not present

## 2018-04-07 DIAGNOSIS — Z7902 Long term (current) use of antithrombotics/antiplatelets: Secondary | ICD-10-CM | POA: Diagnosis not present

## 2018-04-07 DIAGNOSIS — E1151 Type 2 diabetes mellitus with diabetic peripheral angiopathy without gangrene: Secondary | ICD-10-CM | POA: Diagnosis not present

## 2018-04-07 DIAGNOSIS — I87331 Chronic venous hypertension (idiopathic) with ulcer and inflammation of right lower extremity: Secondary | ICD-10-CM | POA: Diagnosis not present

## 2018-04-07 DIAGNOSIS — M10042 Idiopathic gout, left hand: Secondary | ICD-10-CM | POA: Diagnosis not present

## 2018-04-07 DIAGNOSIS — I2510 Atherosclerotic heart disease of native coronary artery without angina pectoris: Secondary | ICD-10-CM | POA: Diagnosis not present

## 2018-04-07 DIAGNOSIS — Z6841 Body Mass Index (BMI) 40.0 and over, adult: Secondary | ICD-10-CM | POA: Diagnosis not present

## 2018-04-08 ENCOUNTER — Ambulatory Visit (HOSPITAL_COMMUNITY)
Admission: RE | Admit: 2018-04-08 | Discharge: 2018-04-08 | Disposition: A | Discharge status: Home / Self Care | Payer: PPO | Source: Ambulatory Visit | Attending: Cardiology | Admitting: Cardiology

## 2018-04-08 DIAGNOSIS — I5032 Chronic diastolic (congestive) heart failure: Secondary | ICD-10-CM | POA: Diagnosis not present

## 2018-04-08 LAB — BASIC METABOLIC PANEL
Anion gap: 11 (ref 5–15)
BUN: 85 mg/dL — ABNORMAL HIGH (ref 8–23)
CO2: 22 mmol/L (ref 22–32)
Calcium: 9.3 mg/dL (ref 8.9–10.3)
Chloride: 102 mmol/L (ref 98–111)
Creatinine, Ser: 2.12 mg/dL — ABNORMAL HIGH (ref 0.44–1.00)
GFR calc Af Amer: 26 mL/min — ABNORMAL LOW (ref >60)
GFR calc non Af Amer: 23 mL/min — ABNORMAL LOW (ref >60)
Glucose, Bld: 441 mg/dL — ABNORMAL HIGH (ref 70–99)
Potassium: 4.6 mmol/L (ref 3.5–5.1)
Sodium: 135 mmol/L (ref 135–145)

## 2018-04-09 ENCOUNTER — Other Ambulatory Visit: Payer: Self-pay | Admitting: Family Medicine

## 2018-04-09 ENCOUNTER — Other Ambulatory Visit: Payer: Self-pay | Admitting: Pharmacist

## 2018-04-09 ENCOUNTER — Telehealth (HOSPITAL_COMMUNITY): Payer: Self-pay | Admitting: Cardiology

## 2018-04-09 ENCOUNTER — Ambulatory Visit: Payer: Self-pay | Admitting: Pharmacist

## 2018-04-09 DIAGNOSIS — I5032 Chronic diastolic (congestive) heart failure: Secondary | ICD-10-CM

## 2018-04-09 NOTE — Addendum Note (Signed)
Addended by: Hailynn Slovacek, Sharlot Gowda on: 04/09/2018 11:40 AM   Modules accepted: Orders

## 2018-04-09 NOTE — Telephone Encounter (Signed)
-----   Message from Georgiana Shore, NP sent at 04/09/2018  7:07 AM EST ----- Renal function improving. Blood sugar is very elevated at 441. Please have her follow up with PCP. Lets recheck another BMET in 2 weeks to make sure creatinine stays stable.

## 2018-04-09 NOTE — Patient Outreach (Addendum)
Susan [name] [facility]) Care Management  04/09/2018  Zury Fazzino 16-Oct-1950 [id number]  Successful outreach to Ms. Krummel with HIPAA identifiers verified x2.  Patient states that she is doing well, but having to go to a lot of different appointments.  She denies the need for a [place] home visit, however will keep an open mind about future [place] home visits.  She will go to see her PCP on 04/13/18.  She reports compliance with her medications and denies any S/SX of SOB or additional fluid in lower extremities.  She states her Endocrinologist (Dr. [name]) has provided her with her insulin pod system.  No additional pharmacy needs identified at this time.  Additional support is needed in the home for this patient.   PLAN: -I will follow up with patient within 2 weeks   Regina Eck, PharmD, [place]  [phone]

## 2018-04-09 NOTE — Telephone Encounter (Signed)
Notes recorded by Kerry Dory, CMA on 04/09/2018 at 11:38 AM EST Patient aware. Repeat lab 11/27   ------  Notes recorded by Harvie Junior, CMA on 04/09/2018 at 8:56 AM EST No answer/no voicemail will call patient again later. Labs forwarded to PCP ------  Notes recorded by Georgiana Shore, NP on 04/09/2018 at 7:07 AM EST Renal function improving. Blood sugar is very elevated at 441. Please have her follow up with PCP. Lets recheck another BMET in 2 weeks to make sure creatinine stays stable.

## 2018-04-12 ENCOUNTER — Inpatient Hospital Stay: Payer: PPO | Admitting: Family Medicine

## 2018-04-13 ENCOUNTER — Other Ambulatory Visit: Payer: Self-pay

## 2018-04-13 ENCOUNTER — Ambulatory Visit (INDEPENDENT_AMBULATORY_CARE_PROVIDER_SITE_OTHER): Payer: PPO | Admitting: Family Medicine

## 2018-04-13 ENCOUNTER — Encounter: Payer: Self-pay | Admitting: Family Medicine

## 2018-04-13 VITALS — BP 138/82 | HR 78 | Temp 97.9°F | Resp 14 | Ht 66.0 in | Wt 260.0 lb

## 2018-04-13 DIAGNOSIS — E1122 Type 2 diabetes mellitus with diabetic chronic kidney disease: Secondary | ICD-10-CM | POA: Diagnosis not present

## 2018-04-13 DIAGNOSIS — B372 Candidiasis of skin and nail: Secondary | ICD-10-CM | POA: Diagnosis not present

## 2018-04-13 DIAGNOSIS — E1142 Type 2 diabetes mellitus with diabetic polyneuropathy: Secondary | ICD-10-CM | POA: Diagnosis not present

## 2018-04-13 DIAGNOSIS — N184 Chronic kidney disease, stage 4 (severe): Secondary | ICD-10-CM

## 2018-04-13 DIAGNOSIS — E1165 Type 2 diabetes mellitus with hyperglycemia: Secondary | ICD-10-CM | POA: Diagnosis not present

## 2018-04-13 DIAGNOSIS — F331 Major depressive disorder, recurrent, moderate: Secondary | ICD-10-CM | POA: Diagnosis not present

## 2018-04-13 DIAGNOSIS — F411 Generalized anxiety disorder: Secondary | ICD-10-CM | POA: Diagnosis not present

## 2018-04-13 MED ORDER — NYSTATIN 100000 UNIT/GM EX CREA: 1.0000 "application " | TOPICAL_CREAM | Freq: Two times a day (BID) | CUTANEOUS | 2 refills | Status: DC

## 2018-04-13 MED ORDER — NYSTATIN 100000 UNIT/GM EX POWD: Freq: Four times a day (QID) | CUTANEOUS | 3 refills | Status: DC

## 2018-04-13 NOTE — Assessment & Plan Note (Signed)
Advised to call her endocrinologist if her sugars are in the 400's She howevere does not adhere to proper diet Unclear if she injections insulin with meals properly either

## 2018-04-13 NOTE — Assessment & Plan Note (Signed)
Reviewed labs, and last nephrology note, they are in discussion of dialysis and I spent time discussing her CKD as a result of her uncontrolled DM

## 2018-04-13 NOTE — Progress Notes (Signed)
Subjective:    Patient ID: Susan [name], female    DOB: 10/23/1950, 67 y.o.   MRN: [id number]  Patient presents for Hospital F/U  Patient here for hospital follow-up.  She was admitted at the end of October and 1 of multiple admissions.  This time was secondary to syncope.  This is after she had been recently admitted with peripheral vascular disease complication status post amputation of her Right foot- great toe and 2nd digit (Dr. Sharol Given), removed secondary to infected blister She also had admission for cellulitis of her left hand, but ultimately treated for gout. She passed out at home while [facility] nurse was there and was found to be hypotensive therefore was readmitted . Her syncopal event was in the setting of orthostatic hypotension and hypovolemia her medications were held into her blood pressures rebounded.  She had cardiac work-up which was negative for any acute changes. Restarted on her carvedilol He also had acute on chronic kidney injury in the setting of her hypotensive and dehydration.   Yeast intertigo - concerned about rash beneath breast that wont go away, she has been using some cornstarch powder'  DM/Thyroid- per endocrinology, she now has an insulin pump, and SSI, CBG still in 400's, last seen on 10/31  She is very depressed, now more since her toes were amputated she feels she has to learn to walk all over again. She is taking her lexapro.  Review Of Systems:  GEN- denies fatigue, fever, weight loss,weakness, recent illness HEENT- denies eye drainage, change in vision, nasal discharge, CVS- denies chest pain, palpitations RESP- denies SOB, cough, wheeze ABD- denies N/V, change in stools, abd pain GU- denies dysuria, hematuria, dribbling, incontinence MSK- = joint pain, muscle aches, injury Neuro- denies headache, dizziness, syncope, seizure activity       Objective:    BP 138/82   Pulse 78   Temp 97.9 F (36.6 C) (Oral)   Resp 14   Ht 5\' 6"  (1.676 m)   Wt 260  lb (117.9 kg)   SpO2 98%   BMI 41.97 kg/m  GEN- NAD, alert and oriented x3,obese HEENT- PERRL, EOMI, non injected sclera, pink conjunctiva, MMM, oropharynx clear Neck- Supple, no LAD CVS- RRR, no murmur RESP-CTAB ABD-NABS,soft,NT,ND Psych- normal affect and mood, a little depressed appearing, not anxious,  EXT- chronic venous stasis, trace edema Skin- erythema with maceration beneath both breast Pulses- Radial 2+ DP-diminished       Assessment & Plan:      Problem List Items Addressed This Visit      Unprioritized   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 mellitus ([place])    Reviewed labs, and last nephrology note, they are in discussion of dialysis and I spent time discussing her CKD as a result of her uncontrolled DM      Diabetes mellitus type II, uncontrolled ([place]) - Primary    Advised to call her endocrinologist if her sugars are in the 400's She howevere does not adhere to proper diet Unclear if she injections insulin with meals properly either      Diabetic neuropathy (HCC)   Generalized anxiety disorder    Depression and anxiety uncontrolled Worsened by her current state of health Discussed therapy, psychiatry before She wants to be referred again Continue lexapro She has many meds already will not add anything today      MDD (major depressive disorder)    Other Visit Diagnoses    Intertriginous candidiasis  Relevant Medications   nystatin (MYCOSTATIN/NYSTOP) powder   nystatin cream (MYCOSTATIN)      Note: This dictation was prepared with Dragon dictation along with smaller phrase technology. Any transcriptional errors that result from this process are unintentional.

## 2018-04-13 NOTE — Patient Instructions (Addendum)
Nystatin powder  Nystatin cream to use  Take all your medications Referral to psychiatry  Keep F/U in December

## 2018-04-13 NOTE — Assessment & Plan Note (Signed)
Depression and anxiety uncontrolled Worsened by her current state of health Discussed therapy, psychiatry before She wants to be referred again Continue lexapro She has many meds already will not add anything today

## 2018-04-14 DIAGNOSIS — Z794 Long term (current) use of insulin: Secondary | ICD-10-CM | POA: Diagnosis not present

## 2018-04-14 DIAGNOSIS — E1122 Type 2 diabetes mellitus with diabetic chronic kidney disease: Secondary | ICD-10-CM | POA: Diagnosis not present

## 2018-04-14 DIAGNOSIS — E1151 Type 2 diabetes mellitus with diabetic peripheral angiopathy without gangrene: Secondary | ICD-10-CM | POA: Diagnosis not present

## 2018-04-14 DIAGNOSIS — Z4781 Encounter for orthopedic aftercare following surgical amputation: Secondary | ICD-10-CM | POA: Diagnosis not present

## 2018-04-14 DIAGNOSIS — I5032 Chronic diastolic (congestive) heart failure: Secondary | ICD-10-CM | POA: Diagnosis not present

## 2018-04-14 DIAGNOSIS — I130 Hypertensive heart and chronic kidney disease with heart failure and stage 1 through stage 4 chronic kidney disease, or unspecified chronic kidney disease: Secondary | ICD-10-CM | POA: Diagnosis not present

## 2018-04-14 DIAGNOSIS — I2510 Atherosclerotic heart disease of native coronary artery without angina pectoris: Secondary | ICD-10-CM | POA: Diagnosis not present

## 2018-04-14 DIAGNOSIS — Z6841 Body Mass Index (BMI) 40.0 and over, adult: Secondary | ICD-10-CM | POA: Diagnosis not present

## 2018-04-14 DIAGNOSIS — Z89411 Acquired absence of right great toe: Secondary | ICD-10-CM | POA: Diagnosis not present

## 2018-04-14 DIAGNOSIS — Z89421 Acquired absence of other right toe(s): Secondary | ICD-10-CM | POA: Diagnosis not present

## 2018-04-14 DIAGNOSIS — E039 Hypothyroidism, unspecified: Secondary | ICD-10-CM | POA: Diagnosis not present

## 2018-04-14 DIAGNOSIS — Z87891 Personal history of nicotine dependence: Secondary | ICD-10-CM | POA: Diagnosis not present

## 2018-04-14 DIAGNOSIS — E1142 Type 2 diabetes mellitus with diabetic polyneuropathy: Secondary | ICD-10-CM | POA: Diagnosis not present

## 2018-04-14 DIAGNOSIS — I87331 Chronic venous hypertension (idiopathic) with ulcer and inflammation of right lower extremity: Secondary | ICD-10-CM | POA: Diagnosis not present

## 2018-04-14 DIAGNOSIS — Z7982 Long term (current) use of aspirin: Secondary | ICD-10-CM | POA: Diagnosis not present

## 2018-04-14 DIAGNOSIS — L03114 Cellulitis of left upper limb: Secondary | ICD-10-CM | POA: Diagnosis not present

## 2018-04-14 DIAGNOSIS — Z7902 Long term (current) use of antithrombotics/antiplatelets: Secondary | ICD-10-CM | POA: Diagnosis not present

## 2018-04-14 DIAGNOSIS — M10042 Idiopathic gout, left hand: Secondary | ICD-10-CM | POA: Diagnosis not present

## 2018-04-14 DIAGNOSIS — N184 Chronic kidney disease, stage 4 (severe): Secondary | ICD-10-CM | POA: Diagnosis not present

## 2018-04-15 ENCOUNTER — Ambulatory Visit: Payer: Self-pay | Admitting: Pharmacist

## 2018-04-19 ENCOUNTER — Encounter (INDEPENDENT_AMBULATORY_CARE_PROVIDER_SITE_OTHER): Payer: Self-pay | Admitting: Orthopedic Surgery

## 2018-04-19 ENCOUNTER — Other Ambulatory Visit: Payer: Self-pay | Admitting: Pharmacist

## 2018-04-19 ENCOUNTER — Ambulatory Visit: Payer: Self-pay | Admitting: Pharmacist

## 2018-04-19 ENCOUNTER — Ambulatory Visit (INDEPENDENT_AMBULATORY_CARE_PROVIDER_SITE_OTHER): Payer: PPO | Admitting: Physician Assistant

## 2018-04-19 VITALS — Ht 66.0 in | Wt 260.0 lb

## 2018-04-19 DIAGNOSIS — L97929 Non-pressure chronic ulcer of unspecified part of left lower leg with unspecified severity: Secondary | ICD-10-CM

## 2018-04-19 DIAGNOSIS — N184 Chronic kidney disease, stage 4 (severe): Secondary | ICD-10-CM

## 2018-04-19 DIAGNOSIS — S98111A Complete traumatic amputation of right great toe, initial encounter: Secondary | ICD-10-CM

## 2018-04-19 DIAGNOSIS — I87333 Chronic venous hypertension (idiopathic) with ulcer and inflammation of bilateral lower extremity: Secondary | ICD-10-CM

## 2018-04-19 DIAGNOSIS — E1142 Type 2 diabetes mellitus with diabetic polyneuropathy: Secondary | ICD-10-CM

## 2018-04-19 DIAGNOSIS — Z794 Long term (current) use of insulin: Secondary | ICD-10-CM

## 2018-04-19 DIAGNOSIS — E1122 Type 2 diabetes mellitus with diabetic chronic kidney disease: Secondary | ICD-10-CM

## 2018-04-19 DIAGNOSIS — Z89411 Acquired absence of right great toe: Secondary | ICD-10-CM

## 2018-04-19 DIAGNOSIS — L97919 Non-pressure chronic ulcer of unspecified part of right lower leg with unspecified severity: Secondary | ICD-10-CM

## 2018-04-19 DIAGNOSIS — E440 Moderate protein-calorie malnutrition: Secondary | ICD-10-CM

## 2018-04-19 MED ORDER — DOXYCYCLINE HYCLATE 100 MG PO CAPS: 100.0000 mg | ORAL_CAPSULE | Freq: Two times a day (BID) | ORAL | 1 refills | Status: DC

## 2018-04-19 NOTE — Patient Outreach (Signed)
[facility]) Care Management  [place]  04/19/2018  Susan [name] 1950-06-25 [id number]   Reason for referral: medication management/adherence  Unsuccessful telephone call attempt to patient.   HIPAA compliant voicemail left requesting a return call  Plan:  I will make another outreach attempt to patient within 3-4 business days   Regina Eck, PharmD, [place]  [phone]

## 2018-04-20 ENCOUNTER — Telehealth (INDEPENDENT_AMBULATORY_CARE_PROVIDER_SITE_OTHER): Payer: Self-pay

## 2018-04-20 ENCOUNTER — Telehealth (INDEPENDENT_AMBULATORY_CARE_PROVIDER_SITE_OTHER): Payer: Self-pay | Admitting: Orthopedic Surgery

## 2018-04-20 NOTE — Telephone Encounter (Signed)
Apolonio Schneiders from [facility] called needing verbal orders 1 wk 4 to continue [facility] visit to monitor new place on patient's right foot. The number to contact [name] is [phone]

## 2018-04-20 NOTE — Telephone Encounter (Signed)
Annette with [facility] called stating that patient is unable to perform PT due to not having upper body strength.  Would like to know if patient is allowed to put pressure on her heel and patient would like to know if she can use something else instead of the compression stockings?  Stated that she is not able to take the compression stockings on/off by herself.  Cb# is [phone].  Please advise. Thank You

## 2018-04-21 ENCOUNTER — Other Ambulatory Visit (INDEPENDENT_AMBULATORY_CARE_PROVIDER_SITE_OTHER): Payer: Self-pay

## 2018-04-21 ENCOUNTER — Other Ambulatory Visit (HOSPITAL_COMMUNITY): Payer: PPO

## 2018-04-21 ENCOUNTER — Telehealth (INDEPENDENT_AMBULATORY_CARE_PROVIDER_SITE_OTHER): Payer: Self-pay

## 2018-04-21 ENCOUNTER — Encounter (INDEPENDENT_AMBULATORY_CARE_PROVIDER_SITE_OTHER): Payer: Self-pay | Admitting: Physician Assistant

## 2018-04-21 NOTE — Telephone Encounter (Signed)
Okay for weightbearing as tolerated on her heel.  The sock could be changed twice a week by therapy or home health nursing.

## 2018-04-21 NOTE — Telephone Encounter (Signed)
Amy Lynch/[facility]/PT called wanting verbal orders to be extended 2x for 3 weeks  Pt developed an infection and has been walking on heels.  Please check w/Duda to make sure this is ok.  Ok to leave vm if no answer. [phone]

## 2018-04-21 NOTE — Telephone Encounter (Signed)
Called to advise orders were ok, per Dr Sharol Given.

## 2018-04-21 NOTE — Progress Notes (Signed)
Office Visit Note   Patient: Susan [name]           Date of Birth: 1951-01-04           MRN: [id number] Visit Date: 04/19/2018              Requested by: Alycia Rossetti, MD [address] [place], [place] [address] PCP: Alycia Rossetti, MD  Chief Complaint  Patient presents with  . Right Foot - Routine Post Op    3rd Toe concerns; 02/24/18 Right Foot great Toe and 2nd Toe Amp      HPI: The patient is a 67 year old female who is seen for postoperative follow-up of her right great toe and second toe amputation on 02/24/2018.  She had been doing well but then developed increased swelling of her right third toe as well as an ulcer and erythema of the third toe.  She reports that her right leg is been burning a lot.  She has been trying to wear the compression socks but it sounds like inconsistently.  Assessment & Plan: Visit Diagnoses:  1. Amputated great toe, right ([place])   2. 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 Type 2 diabetes mellitus with diabetic polyneuropathy, with long-term current use of insulin ([place])   4. Moderate protein malnutrition ([place])   5. 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 mellitus ([place])     Plan: Patient was started on doxycycline 100 mg p.o. twice daily for the next 2 weeks.  Recommend minimizing weightbearing to the right lower extremity is much as possible and walking with her walker.  Also recommend elevation to control her edema and for the patient to continue her compression stocking.  Dr. due to did recommend that she downsized from a 2 XL silver compression sock to a 1 XL sock and she is can to try to do this.  Recommended she continue to utilize her postop shoe.  She will follow-up in 1 week or sooner should she have difficulties in the interim.  Follow-Up Instructions: Return in about 1 week (around 04/26/2018).   Ortho Exam  Patient is alert, oriented, no adenopathy, well-dressed,  normal affect, normal respiratory effort. Patient is ambulating with a walker postop shoe.  Her right third toe is severely erythematous.  She has right lower extremity greater than left lower extremity edema but both lower extremities are quite edematous.  Pulses are by Doppler today.  Imaging: No results found. No images are attached to the encounter.  Labs: Lab Results  Component Value Date   HGBA1C 9.3 (H) 09/25/2017   HGBA1C 11.4 (H) 07/16/2017   HGBA1C 10.2 (H) 02/27/2017   CRP 8.4 (H) 03/05/2018   LABURIC 9.0 (H) 03/06/2018   REPTSTATUS 03/21/2018 FINAL 03/18/2018   CULT (A) 03/18/2018    20,000 COLONIES/mL KLEBSIELLA PNEUMONIAE 20,000 COLONIES/mL ENTEROCOCCUS FAECALIS    LABORGA KLEBSIELLA PNEUMONIAE (A) 03/18/2018   LABORGA ENTEROCOCCUS FAECALIS (A) 03/18/2018     Lab Results  Component Value Date   ALBUMIN 3.0 (L) 03/19/2018   ALBUMIN 3.8 03/18/2018   ALBUMIN 2.6 (L) 02/14/2018   LABURIC 9.0 (H) 03/06/2018    Body mass index is 41.97 kg/m.  Orders:  No orders of the defined types were placed in this encounter.  Meds ordered this encounter  Medications  . doxycycline (VIBRAMYCIN) 100 MG capsule    Sig: Take 1 capsule (100 mg total) by mouth 2 (two)  times daily.    Dispense:  28 capsule    Refill:  1     Procedures: No procedures performed  Clinical Data: No additional findings.  ROS:  All other systems negative, except as noted in the HPI. Review of Systems  Objective: Vital Signs: Ht 5\' 6"  (1.676 m)   Wt 260 lb (117.9 kg)   BMI 41.97 kg/m   Specialty Comments:  No specialty comments available.  PMFS History: Patient Active Problem List   Diagnosis Date Noted  . Pressure injury of skin 03/19/2018  . Syncope 03/18/2018  . Cellulitis of hand, left 03/06/2018  . Cellulitis 03/06/2018  . Amputated great toe, right ([place]) 02/24/2018  . Osteomyelitis of second toe of right foot ([place])   . Osteomyelitis of great toe of right foot ([place])   .  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 ([place])   . Acute on chronic diastolic (congestive) heart failure ([place]) 02/13/2018  . PVD (peripheral vascular disease) ([place]) 10/26/2017  . Hypothyroid 07/27/2017  . PAH (pulmonary artery hypertension) ([place])   . Impaired ambulation 07/19/2017  . Hyperosmolar non-ketotic state in patient with type 2 diabetes mellitus ([place]) 07/15/2017  . Leg cramps 02/27/2017  . Peripheral edema 01/12/2017  . Diabetic neuropathy ([place]) 11/12/2016  . 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 mellitus ([place]) 10/24/2015  . Anemia 10/03/2015  . Generalized anxiety disorder 10/03/2015  . Insomnia 10/03/2015  . Diabetes mellitus type II, uncontrolled ([place]) 06/07/2015  . Chronic diastolic CHF (congestive heart failure) ([place]) 06/07/2015  . Non compliance with medical treatment 04/17/2014  . Rotator cuff tear 03/14/2014  . Morbid obesity ([place]) 09/23/2013  . Hypotension 12/25/2012  . Urinary incontinence   . MDD (major depressive disorder) 11/12/2010  . RBBB (right bundle branch block)   . CAD (coronary artery disease)   . Hyperlipemia 01/22/2009  . Essential hypertension 01/22/2009   Past Medical History:  Diagnosis Date  . Acute MI ([place]) 1999; 2007  . Anemia    hx  . Anginal pain ([place])   . Anxiety   . ARF (acute renal failure) ([place]) 06/2017   [place] [facility]  . Arthritis    "generalized" (03/15/2014)  . CAD (coronary artery disease)    MI in 2000 - MI  2007 - treated bare metal stent (no nuclear since then as 9/11)  . Carotid artery disease ([place])   . CHF (congestive heart failure) ([place])   . Chronic diastolic heart failure (HCC)    a) ECHO (08/2013) EF 55-60% and RV function nl b) RHC (08/2013) RA 4, RV 30/5/7, PA 25/10 (16), PCWP 7, Fick CO/CI 6.3/2.7, PVR 1.5 WU, PA 61 and 66%  . Daily headache    "~ every other day; since I fell in June" (03/15/2014)  . Depression   . Dyslipidemia   . Dyspnea   . Exertional shortness of breath   . HTN  (hypertension)   . Hypothyroidism   . Neuropathy   . Obesity   . Osteoarthritis   . Peripheral neuropathy   . PONV (postoperative nausea and vomiting)   . RBBB (right bundle branch block)    Old  . Stroke [facility])    mini strokes  . Syncope    likely due to low blood sugar  . Tachycardia    Sinus tachycardia  . Type II diabetes mellitus (HCC)    Type II  . Urinary incontinence   . Venous insufficiency     Family History  Problem Relation Age of Onset  . Heart attack Mother 77    Past Surgical History:  Procedure Laterality Date  . ABDOMINAL HYSTERECTOMY  1980's  . AMPUTATION Right 02/24/2018   Procedure: RIGHT FOOT GREAT TOE AND 2ND TOE AMPUTATION;  Surgeon: Newt Minion, MD;  Location: [place];  Service: Orthopedics;  Laterality: Right;  . CATARACT EXTRACTION, BILATERAL Bilateral ?2013  . COLONOSCOPY W/ POLYPECTOMY    . CORONARY ANGIOPLASTY WITH STENT PLACEMENT  1999; 2007   "1 + 1"  . EYE SURGERY Bilateral    lazer  . KNEE ARTHROSCOPY Left 10/25/2006  . RIGHT HEART CATH N/A 07/24/2017   Procedure: RIGHT HEART CATH;  Surgeon: Jolaine Artist, MD;  Location: [place] CV LAB;  Service: Cardiovascular;  Laterality: N/A;  . RIGHT HEART CATHETERIZATION N/A 09/22/2013   Procedure: RIGHT HEART CATH;  Surgeon: Jolaine Artist, MD;  Location: [facility] CATH LAB;  Service: Cardiovascular;  Laterality: N/A;  . SHOULDER ARTHROSCOPY WITH OPEN ROTATOR CUFF REPAIR Right 03/14/2014   Procedure: RIGHT SHOULDER ARTHROSCOPY WITH BICEPS RELEASE, OPEN SUBSCAPULA REPAIR, OPEN SUPRASPINATUS REPAIR.;  Surgeon: Meredith Pel, MD;  Location: [place];  Service: Orthopedics;  Laterality: Right;  . TOE AMPUTATION Right 02/24/2018   GREAT TOE AND 2ND TOE AMPUTATION  . TUBAL LIGATION  1970's   Social History   Occupational History    Employer: UNEMPLOYED  Tobacco Use  . Smoking status: Former Smoker    Packs/day: 3.00    Years: 32.00    Pack years: 96.00    Types: Cigarettes    Last attempt  to quit: 10/24/1997    Years since quitting: 20.5  . Smokeless tobacco: Never Used  Substance and Sexual Activity  . Alcohol use: Not Currently    Comment: "might have 2-3 daiquiris in the summer"  . Drug use: No  . Sexual activity: Never

## 2018-04-21 NOTE — Telephone Encounter (Signed)
I called and spoke to nurse Ms Drema Dallas to advise her verbal orders for to continue [facility] for patient is ok. Thank you

## 2018-04-21 NOTE — Telephone Encounter (Signed)
Talked with [name] and advised her of message below per Dr. Sharol Given.

## 2018-04-21 NOTE — Telephone Encounter (Signed)
Done

## 2018-04-23 ENCOUNTER — Ambulatory Visit: Payer: Self-pay | Admitting: Pharmacist

## 2018-04-23 ENCOUNTER — Other Ambulatory Visit: Payer: Self-pay | Admitting: Pharmacist

## 2018-04-23 NOTE — Patient Outreach (Signed)
[facility]) Care Management  [place]  04/23/2018  Meah Jiron Feb 10, 1951 [id number]   Reason for referral: medication management/adherence  Unsuccessful telephone call attempt to patient.   HIPAA compliant voicemail left requesting a return call  Plan:  I will make another outreach attempt to patient within 3-4 business days  Regina Eck, PharmD, [place]  [phone]

## 2018-04-26 ENCOUNTER — Ambulatory Visit (INDEPENDENT_AMBULATORY_CARE_PROVIDER_SITE_OTHER): Payer: PPO | Admitting: Physician Assistant

## 2018-04-26 ENCOUNTER — Encounter (INDEPENDENT_AMBULATORY_CARE_PROVIDER_SITE_OTHER): Payer: Self-pay | Admitting: Orthopedic Surgery

## 2018-04-26 VITALS — Ht 66.0 in | Wt 260.0 lb

## 2018-04-26 DIAGNOSIS — S98111A Complete traumatic amputation of right great toe, initial encounter: Secondary | ICD-10-CM

## 2018-04-26 DIAGNOSIS — L97929 Non-pressure chronic ulcer of unspecified part of left lower leg with unspecified severity: Secondary | ICD-10-CM

## 2018-04-26 DIAGNOSIS — L97919 Non-pressure chronic ulcer of unspecified part of right lower leg with unspecified severity: Secondary | ICD-10-CM

## 2018-04-26 DIAGNOSIS — E1142 Type 2 diabetes mellitus with diabetic polyneuropathy: Secondary | ICD-10-CM

## 2018-04-26 DIAGNOSIS — Z794 Long term (current) use of insulin: Secondary | ICD-10-CM

## 2018-04-26 DIAGNOSIS — I5032 Chronic diastolic (congestive) heart failure: Secondary | ICD-10-CM

## 2018-04-26 DIAGNOSIS — I87333 Chronic venous hypertension (idiopathic) with ulcer and inflammation of bilateral lower extremity: Secondary | ICD-10-CM

## 2018-04-26 DIAGNOSIS — Z89412 Acquired absence of left great toe: Secondary | ICD-10-CM

## 2018-04-26 NOTE — Progress Notes (Signed)
Office Visit Note   Patient: Susan [name]           Date of Birth: 05/02/1951           MRN: [id number] Visit Date: 04/26/2018              Requested by: Alycia Rossetti, MD [address] [place], [place] [address] PCP: Alycia Rossetti, MD  Chief Complaint  Patient presents with  . Right Foot - Routine Post Op    02/24/18 right GT ans 2nd toe amputation       HPI: The patient is a 67 yo female here with her daughter for follow up of her right great toe amputation and second toe amputation 02/24/2018. She developed new ulceration and redness of her right 3rd toe and was started on Doxycycline last week. The edema and erythema has improved, but the ulcer is now tracking to the bone. She has been wearing her silver compression sock and trying to keep the foot elevated as much as possible.   Assessment & Plan: Visit Diagnoses:  1. Amputated great toe, right ([place])   2. 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 Type 2 diabetes mellitus with diabetic polyneuropathy, with long-term current use of insulin ([place])     Plan: Plan to return to OR later this week for amputation of the remaining toes of the right foot. She will likely have a VAC placed at that time. She will follow up in the office post operatively .   Follow-Up Instructions: Return in about 1 week (around 05/03/2018).   Ortho Exam  Patient is alert, oriented, no adenopathy, well-dressed, normal affect, normal respiratory effort. The right 3rd has an ulcer over the distal tuft which tracks to the bone. There is necrosis over the distal tuft and drainage. There is erythema of the 3rd toe and edema. There is edema of the right lower leg. Palpable dorsalis pedis pulse.   Imaging: No results found. No images are attached to the encounter.  Labs: Lab Results  Component Value Date   HGBA1C 9.3 (H) 09/25/2017   HGBA1C 11.4 (H) 07/16/2017   HGBA1C 10.2 (H)  02/27/2017   CRP 8.4 (H) 03/05/2018   LABURIC 9.0 (H) 03/06/2018   REPTSTATUS 03/21/2018 FINAL 03/18/2018   CULT (A) 03/18/2018    20,000 COLONIES/mL KLEBSIELLA PNEUMONIAE 20,000 COLONIES/mL ENTEROCOCCUS FAECALIS    LABORGA KLEBSIELLA PNEUMONIAE (A) 03/18/2018   LABORGA ENTEROCOCCUS FAECALIS (A) 03/18/2018     Lab Results  Component Value Date   ALBUMIN 3.0 (L) 03/19/2018   ALBUMIN 3.8 03/18/2018   ALBUMIN 2.6 (L) 02/14/2018   LABURIC 9.0 (H) 03/06/2018    Body mass index is 41.97 kg/m.  Orders:  No orders of the defined types were placed in this encounter.  No orders of the defined types were placed in this encounter.    Procedures: No procedures performed  Clinical Data: No additional findings.  ROS:  All other systems negative, except as noted in the HPI. Review of Systems  Objective: Vital Signs: Ht 5\' 6"  (1.676 m)   Wt 260 lb (117.9 kg)   BMI 41.97 kg/m   Specialty Comments:  No specialty comments available.  PMFS History: Patient Active Problem List   Diagnosis Date Noted  . Pressure injury of skin 03/19/2018  . Syncope 03/18/2018  . Cellulitis of hand, left 03/06/2018  . Cellulitis 03/06/2018  . Amputated great toe, right ([place])  02/24/2018  . Osteomyelitis of second toe of right foot ([place])   . Osteomyelitis of great toe of right foot ([place])   . 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 ([place])   . Acute on chronic diastolic (congestive) heart failure ([place]) 02/13/2018  . PVD (peripheral vascular disease) ([place]) 10/26/2017  . Hypothyroid 07/27/2017  . PAH (pulmonary artery hypertension) ([place])   . Impaired ambulation 07/19/2017  . Hyperosmolar non-ketotic state in patient with type 2 diabetes mellitus ([place]) 07/15/2017  . Leg cramps 02/27/2017  . Peripheral edema 01/12/2017  . Diabetic neuropathy ([place]) 11/12/2016  . 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 mellitus ([place]) 10/24/2015  . Anemia 10/03/2015  . Generalized anxiety disorder  10/03/2015  . Insomnia 10/03/2015  . Diabetes mellitus type II, uncontrolled ([place]) 06/07/2015  . Chronic diastolic CHF (congestive heart failure) ([place]) 06/07/2015  . Non compliance with medical treatment 04/17/2014  . Rotator cuff tear 03/14/2014  . Morbid obesity ([place]) 09/23/2013  . Hypotension 12/25/2012  . Urinary incontinence   . MDD (major depressive disorder) 11/12/2010  . RBBB (right bundle branch block)   . CAD (coronary artery disease)   . Hyperlipemia 01/22/2009  . Essential hypertension 01/22/2009   Past Medical History:  Diagnosis Date  . Acute MI ([place]) 1999; 2007  . Anemia    hx  . Anginal pain ([place])   . Anxiety   . ARF (acute renal failure) ([place]) 06/2017   [facility]  . Arthritis    "generalized" (03/15/2014)  . CAD (coronary artery disease)    MI in 2000 - MI  2007 - treated bare metal stent (no nuclear since then as 9/11)  . Carotid artery disease ([place])   . CHF (congestive heart failure) ([place])   . Chronic diastolic heart failure (HCC)    a) ECHO (08/2013) EF 55-60% and RV function nl b) RHC (08/2013) RA 4, RV 30/5/7, PA 25/10 (16), PCWP 7, Fick CO/CI 6.3/2.7, PVR 1.5 WU, PA 61 and 66%  . Daily headache    "~ every other day; since I fell in June" (03/15/2014)  . Depression   . Dyslipidemia   . Dyspnea   . Exertional shortness of breath   . HTN (hypertension)   . Hypothyroidism   . Neuropathy   . Obesity   . Osteoarthritis   . Peripheral neuropathy   . PONV (postoperative nausea and vomiting)   . RBBB (right bundle branch block)    Old  . Stroke [facility])    mini strokes  . Syncope    likely due to low blood sugar  . Tachycardia    Sinus tachycardia  . Type II diabetes mellitus (HCC)    Type II  . Urinary incontinence   . Venous insufficiency     Family History  Problem Relation Age of Onset  . Heart attack Mother 35    Past Surgical History:  Procedure Laterality Date  . ABDOMINAL HYSTERECTOMY  1980's  . AMPUTATION Right 02/24/2018     Procedure: RIGHT FOOT GREAT TOE AND 2ND TOE AMPUTATION;  Surgeon: Newt Minion, MD;  Location: [place];  Service: Orthopedics;  Laterality: Right;  . CATARACT EXTRACTION, BILATERAL Bilateral ?2013  . COLONOSCOPY W/ POLYPECTOMY    . CORONARY ANGIOPLASTY WITH STENT PLACEMENT  1999; 2007   "1 + 1"  . EYE SURGERY Bilateral    lazer  . KNEE ARTHROSCOPY Left 10/25/2006  . RIGHT HEART CATH N/A 07/24/2017   Procedure: RIGHT HEART  CATH;  Surgeon: Jolaine Artist, MD;  Location: [place] CV LAB;  Service: Cardiovascular;  Laterality: N/A;  . RIGHT HEART CATHETERIZATION N/A 09/22/2013   Procedure: RIGHT HEART CATH;  Surgeon: Jolaine Artist, MD;  Location: [facility] CATH LAB;  Service: Cardiovascular;  Laterality: N/A;  . SHOULDER ARTHROSCOPY WITH OPEN ROTATOR CUFF REPAIR Right 03/14/2014   Procedure: RIGHT SHOULDER ARTHROSCOPY WITH BICEPS RELEASE, OPEN SUBSCAPULA REPAIR, OPEN SUPRASPINATUS REPAIR.;  Surgeon: Meredith Pel, MD;  Location: [place];  Service: Orthopedics;  Laterality: Right;  . TOE AMPUTATION Right 02/24/2018   GREAT TOE AND 2ND TOE AMPUTATION  . TUBAL LIGATION  1970's   Social History   Occupational History    Employer: UNEMPLOYED  Tobacco Use  . Smoking status: Former Smoker    Packs/day: 3.00    Years: 32.00    Pack years: 96.00    Types: Cigarettes    Last attempt to quit: 10/24/1997    Years since quitting: 20.5  . Smokeless tobacco: Never Used  Substance and Sexual Activity  . Alcohol use: Not Currently    Comment: "might have 2-3 daiquiris in the summer"  . Drug use: No  . Sexual activity: Never

## 2018-04-26 NOTE — Addendum Note (Signed)
Addended by: Pamella Pert on: 04/26/2018 02:49 PM   Modules accepted: Orders

## 2018-04-27 ENCOUNTER — Ambulatory Visit (INDEPENDENT_AMBULATORY_CARE_PROVIDER_SITE_OTHER): Payer: Self-pay | Admitting: Physician Assistant

## 2018-04-27 LAB — BASIC METABOLIC PANEL
BUN/Creatinine Ratio: 27 (calc) — ABNORMAL HIGH (ref 6–22)
BUN: 59 mg/dL — ABNORMAL HIGH (ref 7–25)
CO2: 27 mmol/L (ref 20–32)
Calcium: 9.8 mg/dL (ref 8.6–10.4)
Chloride: 101 mmol/L (ref 98–110)
Creat: 2.18 mg/dL — ABNORMAL HIGH (ref 0.50–0.99)
Glucose, Bld: 189 mg/dL — ABNORMAL HIGH (ref 65–99)
Potassium: 4.8 mmol/L (ref 3.5–5.3)
Sodium: 140 mmol/L (ref 135–146)

## 2018-04-27 LAB — EXTRA LAV TOP TUBE

## 2018-04-28 ENCOUNTER — Ambulatory Visit: Payer: Self-pay | Admitting: Pharmacist

## 2018-04-29 ENCOUNTER — Other Ambulatory Visit: Payer: Self-pay

## 2018-04-29 ENCOUNTER — Other Ambulatory Visit: Payer: Self-pay | Admitting: Pharmacist

## 2018-04-29 ENCOUNTER — Encounter (HOSPITAL_COMMUNITY): Payer: Self-pay | Admitting: Urology

## 2018-04-29 MED ORDER — DEXTROSE 5 % IV SOLN
3.0000 g | INTRAVENOUS | Status: AC
  Administered 2018-04-30: 3 g via INTRAVENOUS
  Filled 2018-04-29: qty 3

## 2018-04-29 NOTE — Anesthesia Preprocedure Evaluation (Addendum)
Anesthesia Evaluation  Patient identified by MRN, date of birth, ID band Patient awake    Reviewed: Allergy & Precautions, NPO status , Patient's Chart, lab work & pertinent test results  History of Anesthesia Complications (+) PONVNegative for: history of anesthetic complications  Airway Mallampati: III  TM Distance: >3 FB Neck ROM: Full    Dental no notable dental hx.    Pulmonary shortness of breath, former smoker (98 PY),    Pulmonary exam normal        Cardiovascular hypertension, Pt. on home beta blockers + CAD, + Past MI, + Cardiac Stents, + Peripheral Vascular Disease, + Orthopnea and + DOE  Normal cardiovascular exam     Neuro/Psych PSYCHIATRIC DISORDERS Anxiety Depression TIA   GI/Hepatic negative GI ROS, Neg liver ROS,   Endo/Other  diabetes, Poorly Controlled, Type 2, Insulin DependentHypothyroidism Morbid obesity  Renal/GU Renal InsufficiencyRenal disease  negative genitourinary   Musculoskeletal negative musculoskeletal ROS (+)   Abdominal   Peds  Hematology negative hematology ROS (+)   Anesthesia Other Findings 67 yo F for R TMA - PONV, TIA, HTN, RBBB, CAD/MI s/p BMS (1999, 2007), DOE/orthopnea, PVD, CKD, poorly controlled IDDM (insulin pump), BMI 45 - MPS 01/29/17: Nuclear stress EF 68%; small defect in apical region consistent with probable soft tissue attenuation; No significant ischemia or scar; Low risk study  Reproductive/Obstetrics                           Anesthesia Physical Anesthesia Plan  ASA: III  Anesthesia Plan: Regional   Post-op Pain Management:  Regional for Post-op pain   Induction:   PONV Risk Score and Plan: 3 and Propofol infusion, TIVA, Midazolam and Treatment may vary due to age or medical condition  Airway Management Planned: Nasal Cannula and Simple Face Mask  Additional Equipment: None  Intra-op Plan:   Post-operative Plan:   Informed  Consent: I have reviewed the patients History and Physical, chart, labs and discussed the procedure including the risks, benefits and alternatives for the proposed anesthesia with the patient or authorized representative who has indicated his/her understanding and acceptance.     Plan Discussed with:   Anesthesia Plan Comments: (See PAT note 02/23/2018 prior to previous right foot great and 2nd toe amputation. Pt recently admitted 10/24 - 03/21/2018 for syncopal episode. Syncope thought to be due to hypotension and hypovolemia from medications. Echo showed mild LVH,Systolic function was normal, EF 55% to 60%,grade 1dd. Serial troponins negative. EKG did not show any ischemic changes. Overnight telemetry monitor did not show any ischemic changes. She also had AKI on CKD, likely due to hypovolemia. Condition improved with hydration and medication management.   Per PAT nurse phone call, pt endorses 3 pillow orthopnea. States she is SOB, but not different than baseline prior to previous surgery in October. Pt has insulin pump. Poor DM control. DOS directions per pt's endocrinologist, see note by Sheilah Mins, RN.)   Anesthesia Quick Evaluation

## 2018-04-29 NOTE — Patient Outreach (Signed)
[place] [facility]) Care Management  04/29/2018  Susan [name] 1951/03/18 [id number]   Reason for referral: Medication management  HPI: Successful outreach call to [name] with HIPAA identifiers verified x2.  Patient has upcoming surgery (amputation) tomorrow, Dec 6th.  She states that she is very anxious, but has been through this before.  Patient states she is doing "okay" today.  She reports her BGs have been higher this week likely due to stress and the holidays.  BG yesterday was elevated in the 400s and patient stated she followed Dr. Almetta Lovely order to drink more fluid and sliding scale insulin (Humulin N).  Her BG returned to the 200s.  Reminded patient of fluid intake with CHF DX, however she knows to call and follow Dr. Almetta Lovely instructions when BGs are elevated. Counseled patient on S/SX of hypoglycemia and plan.  Patient reports that she is mostly resting in bed due to upcoming surgery and her inability to move around well.  She continues to have home health.  Patient agreeable to [place] Visit 2 weeks following surgery.  PLAN:  -I will outreach to patient next week to schedule [place] Visit  Regina Eck, PharmD, [place]  [phone]

## 2018-04-29 NOTE — Progress Notes (Addendum)
Pre-op call completed with patient.   Pt reports blood sugar yesterday was 400, but today was 100.  Pt is diabetic with insulin pump- Omnipod. Unsure if has basal rate. Takes NPH and Regular insulin SSI.  Pt instructed to only take half of NPH insulin if needed (pt only takes in the morning if blood sugar is over 200) the day of surgery. Pt instructed to only take half of Sliding scale insulin (Insulin regular) the morning of surgery if blood sugar is over 220 that morning.  (pt states she has starting only checking her blood sugar 3 times a day instead of 4 times a day d/t her fingers splitting open) Pt to check CBG In AM then every 2 hours until arrives to hospital. If blood sugar is less than 70, then she will take 1/2 cup of juice or sprite and re-check in 15 minutes.  Pt states she has been to a class on her insulin pump, but still needs help using it and will not be able to manage while she is in the hospital after surgery. Pt was placed on insulin injections when in the hospital in October.  Update 12:45PM: I Spoke with Dr. [name] and she stated that she will call the patient with the following instructions (Keep basal rate over night, if blood sugar over 200 in the morning to give 2 units bolus of sliding scale insulin, pump to be removed at hospital and patient to be managed with insulin injections for hospital stay). Per Dr. [name], Pt was placed on insulin pump in order to simplify giving herself sliding scale insulin without having to do calculations (gives herself a low, medium and high dose of sliding scale based off of blow sugar).  DM coordinator paged to notify of patient surgery tomorrow and insulin pump use.   Update: 13:37 PM: DM Coordinator notified of patient and will come see patient after surgery to help arrange appropriate orders for inpatient stay.   Pt states she is still short of breath with exertion and sleeps either in recliner or with 3 pillows. Pt states this started a week  ago, but states it is no worse than it was in October.   Pt also reports having chest pains every once in a while, last 2 weeks ago. Chest pain comes on all of a sudden regardless of activity and is sharp pain to middle of chest then to right chest over breast. Pt states it never goes to the left side of the chest and is relieved with 1 nitroglycerin. Pt cannot remember who her cardiologist is.  Pt states she was not told to stop taking ASA or Plavix and has taken them today.   Notified Jeneen Rinks PA with anesthesia, and I will attempt to call Dr. Almetta Lovely office for more instructions on insulin pump. Pt stated that she would rather I call Dr. [name].

## 2018-04-30 ENCOUNTER — Encounter (HOSPITAL_COMMUNITY): Payer: Self-pay | Admitting: *Deleted

## 2018-04-30 ENCOUNTER — Ambulatory Visit (HOSPITAL_COMMUNITY): Payer: PPO | Admitting: Physician Assistant

## 2018-04-30 ENCOUNTER — Other Ambulatory Visit: Payer: Self-pay | Admitting: Licensed Clinical Social Worker

## 2018-04-30 ENCOUNTER — Encounter (HOSPITAL_COMMUNITY)
Admission: RE | Disposition: A | Discharge status: Home / Self Care | Payer: Self-pay | Source: Home / Self Care | Attending: Orthopedic Surgery

## 2018-04-30 ENCOUNTER — Ambulatory Visit (HOSPITAL_COMMUNITY)
Admission: RE | Admit: 2018-04-30 | Discharge: 2018-04-30 | Disposition: A | Discharge status: Home / Self Care | Payer: PPO | Attending: Orthopedic Surgery | Admitting: Orthopedic Surgery

## 2018-04-30 DIAGNOSIS — M86171 Other acute osteomyelitis, right ankle and foot: Secondary | ICD-10-CM

## 2018-04-30 DIAGNOSIS — I252 Old myocardial infarction: Secondary | ICD-10-CM | POA: Insufficient documentation

## 2018-04-30 DIAGNOSIS — Z6841 Body Mass Index (BMI) 40.0 and over, adult: Secondary | ICD-10-CM | POA: Insufficient documentation

## 2018-04-30 DIAGNOSIS — M868X7 Other osteomyelitis, ankle and foot: Secondary | ICD-10-CM | POA: Insufficient documentation

## 2018-04-30 DIAGNOSIS — Z79899 Other long term (current) drug therapy: Secondary | ICD-10-CM | POA: Insufficient documentation

## 2018-04-30 DIAGNOSIS — I5032 Chronic diastolic (congestive) heart failure: Secondary | ICD-10-CM | POA: Diagnosis not present

## 2018-04-30 DIAGNOSIS — Z87891 Personal history of nicotine dependence: Secondary | ICD-10-CM | POA: Diagnosis not present

## 2018-04-30 DIAGNOSIS — M869 Osteomyelitis, unspecified: Secondary | ICD-10-CM

## 2018-04-30 DIAGNOSIS — I10 Essential (primary) hypertension: Secondary | ICD-10-CM | POA: Diagnosis not present

## 2018-04-30 DIAGNOSIS — Z794 Long term (current) use of insulin: Secondary | ICD-10-CM | POA: Insufficient documentation

## 2018-04-30 DIAGNOSIS — E1169 Type 2 diabetes mellitus with other specified complication: Secondary | ICD-10-CM | POA: Diagnosis not present

## 2018-04-30 DIAGNOSIS — I2510 Atherosclerotic heart disease of native coronary artery without angina pectoris: Secondary | ICD-10-CM | POA: Insufficient documentation

## 2018-04-30 DIAGNOSIS — F329 Major depressive disorder, single episode, unspecified: Secondary | ICD-10-CM | POA: Insufficient documentation

## 2018-04-30 DIAGNOSIS — I110 Hypertensive heart disease with heart failure: Secondary | ICD-10-CM | POA: Diagnosis not present

## 2018-04-30 HISTORY — PX: AMPUTATION: SHX166

## 2018-04-30 LAB — GLUCOSE, CAPILLARY
Glucose-Capillary: 315 mg/dL — ABNORMAL HIGH (ref 70–99)
Glucose-Capillary: 296 mg/dL — ABNORMAL HIGH (ref 70–99)
Glucose-Capillary: 291 mg/dL — ABNORMAL HIGH (ref 70–99)

## 2018-04-30 LAB — BASIC METABOLIC PANEL
Anion gap: 13 (ref 5–15)
BUN: 65 mg/dL — ABNORMAL HIGH (ref 8–23)
CO2: 23 mmol/L (ref 22–32)
Calcium: 9 mg/dL (ref 8.9–10.3)
Chloride: 102 mmol/L (ref 98–111)
Creatinine, Ser: 2.31 mg/dL — ABNORMAL HIGH (ref 0.44–1.00)
GFR calc Af Amer: 24 mL/min — ABNORMAL LOW (ref >60)
GFR calc non Af Amer: 21 mL/min — ABNORMAL LOW (ref >60)
Glucose, Bld: 320 mg/dL — ABNORMAL HIGH (ref 70–99)
Potassium: 3.7 mmol/L (ref 3.5–5.1)
Sodium: 138 mmol/L (ref 135–145)

## 2018-04-30 LAB — CBC
HCT: 35.3 % — ABNORMAL LOW (ref 36.0–46.0)
Hemoglobin: 10.6 g/dL — ABNORMAL LOW (ref 12.0–15.0)
MCH: 24.3 pg — ABNORMAL LOW (ref 26.0–34.0)
MCHC: 30 g/dL (ref 30.0–36.0)
MCV: 80.8 fL (ref 80.0–100.0)
Platelets: 226 10*3/uL (ref 150–400)
RBC: 4.37 MIL/uL (ref 3.87–5.11)
RDW: 19.4 % — ABNORMAL HIGH (ref 11.5–15.5)
WBC: 6 10*3/uL (ref 4.0–10.5)
nRBC: 0 % (ref 0.0–0.2)

## 2018-04-30 LAB — HEMOGLOBIN A1C
Hgb A1c MFr Bld: 8.3 % — ABNORMAL HIGH (ref 4.8–5.6)
Mean Plasma Glucose: 191.51 mg/dL

## 2018-04-30 SURGERY — AMPUTATION, FOOT, RAY
Anesthesia: Regional | Laterality: Right

## 2018-04-30 MED ORDER — FENTANYL CITRATE (PF) 100 MCG/2ML IJ SOLN
INTRAMUSCULAR | Status: DC | PRN
  Administered 2018-04-30 (×3): 50 ug via INTRAVENOUS

## 2018-04-30 MED ORDER — PROPOFOL 10 MG/ML IV BOLUS
INTRAVENOUS | Status: AC
  Filled 2018-04-30: qty 60

## 2018-04-30 MED ORDER — ROPIVACAINE HCL 5 MG/ML IJ SOLN
INTRAMUSCULAR | Status: DC | PRN
  Administered 2018-04-30: 25 mL via PERINEURAL
  Administered 2018-04-30: 20 mL via PERINEURAL

## 2018-04-30 MED ORDER — FENTANYL CITRATE (PF) 250 MCG/5ML IJ SOLN
INTRAMUSCULAR | Status: AC
  Filled 2018-04-30: qty 5

## 2018-04-30 MED ORDER — MIDAZOLAM HCL 5 MG/5ML IJ SOLN
INTRAMUSCULAR | Status: DC | PRN
  Administered 2018-04-30 (×2): 1 mg via INTRAVENOUS

## 2018-04-30 MED ORDER — OXYCODONE-ACETAMINOPHEN 5-325 MG PO TABS: 1.0000 | ORAL_TABLET | ORAL | 0 refills | Status: DC | PRN

## 2018-04-30 MED ORDER — MIDAZOLAM HCL 2 MG/2ML IJ SOLN
INTRAMUSCULAR | Status: AC
  Filled 2018-04-30: qty 2

## 2018-04-30 MED ORDER — OXYCODONE HCL 5 MG PO TABS: 5.0000 mg | ORAL_TABLET | Freq: Once | ORAL | Status: DC | PRN

## 2018-04-30 MED ORDER — CHLORHEXIDINE GLUCONATE 4 % EX LIQD: 60.0000 mL | Freq: Once | CUTANEOUS | Status: DC

## 2018-04-30 MED ORDER — LACTATED RINGERS IV SOLN
INTRAVENOUS | Status: DC | PRN
  Administered 2018-04-30: 07:00:00 via INTRAVENOUS

## 2018-04-30 MED ORDER — LIDOCAINE HCL (CARDIAC) PF 100 MG/5ML IV SOSY
PREFILLED_SYRINGE | INTRAVENOUS | Status: DC | PRN
  Administered 2018-04-30: 60 mg via INTRAVENOUS

## 2018-04-30 MED ORDER — OXYCODONE HCL 5 MG/5ML PO SOLN: 5.0000 mg | Freq: Once | ORAL | Status: DC | PRN

## 2018-04-30 MED ORDER — 0.9 % SODIUM CHLORIDE (POUR BTL) OPTIME
TOPICAL | Status: DC | PRN
  Administered 2018-04-30: 1000 mL

## 2018-04-30 MED ORDER — ONDANSETRON HCL 4 MG/2ML IJ SOLN: 4.0000 mg | Freq: Once | INTRAMUSCULAR | Status: DC | PRN

## 2018-04-30 MED ORDER — PROPOFOL 10 MG/ML IV BOLUS
INTRAVENOUS | Status: DC | PRN
  Administered 2018-04-30: 10 mg via INTRAVENOUS
  Administered 2018-04-30 (×5): 20 mg via INTRAVENOUS
  Administered 2018-04-30: 10 mg via INTRAVENOUS

## 2018-04-30 SURGICAL SUPPLY — 30 items
BLADE SAW SGTL MED 73X18.5 STR (BLADE) IMPLANT
BLADE SURG 21 STRL SS (BLADE) ×2 IMPLANT
BNDG COHESIVE 4X5 TAN STRL (GAUZE/BANDAGES/DRESSINGS) ×2 IMPLANT
BNDG GAUZE ELAST 4 BULKY (GAUZE/BANDAGES/DRESSINGS) ×2 IMPLANT
COVER SURGICAL LIGHT HANDLE (MISCELLANEOUS) ×4 IMPLANT
COVER WAND RF STERILE (DRAPES) ×2 IMPLANT
DRAPE U-SHAPE 47X51 STRL (DRAPES) ×4 IMPLANT
DRSG ADAPTIC 3X8 NADH LF (GAUZE/BANDAGES/DRESSINGS) ×2 IMPLANT
DRSG PAD ABDOMINAL 8X10 ST (GAUZE/BANDAGES/DRESSINGS) ×4 IMPLANT
DURAPREP 26ML APPLICATOR (WOUND CARE) ×2 IMPLANT
ELECT REM PT RETURN 9FT ADLT (ELECTROSURGICAL) ×2
ELECTRODE REM PT RTRN 9FT ADLT (ELECTROSURGICAL) ×1 IMPLANT
GAUZE SPONGE 4X4 12PLY STRL (GAUZE/BANDAGES/DRESSINGS) ×2 IMPLANT
GLOVE BIOGEL PI IND STRL 9 (GLOVE) ×1 IMPLANT
GLOVE BIOGEL PI INDICATOR 9 (GLOVE) ×1
GLOVE SURG ORTHO 9.0 STRL STRW (GLOVE) ×2 IMPLANT
GOWN STRL REUS W/ TWL XL LVL3 (GOWN DISPOSABLE) ×2 IMPLANT
GOWN STRL REUS W/TWL XL LVL3 (GOWN DISPOSABLE) ×2
KIT BASIN OR (CUSTOM PROCEDURE TRAY) ×2 IMPLANT
KIT DRSG PREVENA PLUS 7DAY 125 (MISCELLANEOUS) ×2 IMPLANT
KIT PREVENA INCISION MGT 13 (CANNISTER) ×2 IMPLANT
KIT TURNOVER KIT B (KITS) ×2 IMPLANT
NS IRRIG 1000ML POUR BTL (IV SOLUTION) ×2 IMPLANT
PACK ORTHO EXTREMITY (CUSTOM PROCEDURE TRAY) ×2 IMPLANT
PAD ARMBOARD 7.5X6 YLW CONV (MISCELLANEOUS) ×4 IMPLANT
STOCKINETTE IMPERVIOUS LG (DRAPES) IMPLANT
SUT ETHILON 2 0 PSLX (SUTURE) ×2 IMPLANT
TOWEL OR 17X26 10 PK STRL BLUE (TOWEL DISPOSABLE) ×2 IMPLANT
TUBE CONNECTING 12X1/4 (SUCTIONS) ×2 IMPLANT
YANKAUER SUCT BULB TIP NO VENT (SUCTIONS) ×2 IMPLANT

## 2018-04-30 NOTE — Anesthesia Procedure Notes (Signed)
Anesthesia Regional Block: Adductor canal block   Pre-Anesthetic Checklist: ,, timeout performed, Correct Patient, Correct Site, Correct Laterality, Correct Procedure, Correct Position, site marked, Risks and benefits discussed,  Surgical consent,  Pre-op evaluation,  At surgeon's request and post-op pain management  Laterality: Right  Prep: chloraprep       Needles:  Injection technique: Single-shot  Needle Type: Echogenic Stimulator Needle     Needle Length: 9cm  Needle Gauge: 21     Additional Needles:   Procedures:,,,, ultrasound used (permanent image in chart),,,,  Narrative:  Start time: 04/30/2018 7:05 AM End time: 04/30/2018 7:08 AM Injection made incrementally with aspirations every 5 mL.  Performed by: Personally  Anesthesiologist: Lidia Collum, MD  Additional Notes: Monitors applied. Injection made in 5cc increments. No resistance to injection. Good needle visualization. Patient tolerated procedure well.

## 2018-04-30 NOTE — Patient Outreach (Signed)
Assessment:  CSW spoke via phone with client. CSW verified client identity. CSW received verbal permission from client for CSW to speak with client about client needs. Client said she underwent surgery procedure this morning and has already been discharged from the hospital back to her home with supports in place. She receives support from her spouse. She receives support from home health agency as scheduled. CSW talked with client about client care plan.  CSW has encouraged Aviyana to continue to use relaxation techniques of choice in next 30 days to help her manage anxiety symptoms or depression symptoms experienced by client. Client spoke of relaxation techniques she uses to help her manage symptoms faced. Client sees Dr. Sharol Given as scheduled. Client sees Dr. [name] as primary care doctor. Client receives [facility] pharmacy support with [facility] pharmacist, Royce Macadamia. [place].  CSW and Lottie Dawson have collaborated regarding current needs of client related to social work and pharmacy support. Client said she had her prescribed medications and is taking medications as prescribed.  Client said her spouse helps transport her to and from her scheduled medical appointments.  CSW encouraged client to call CSW at [phone] as needed to discuss social work needs of client.    Plan:  Client to continue to use relaxation techniques of choice in the next 30 days to help Cesily in managing anxiety symptoms or depression symptoms experienced by client.   CSW to call client in 4 weeks to assess client needs.  [name].Jovani Colquhoun MSW, LCSW Licensed Clinical Social Worker [facility] Care Management [phone]

## 2018-04-30 NOTE — H&P (Signed)
Susan [name] is an 67 y.o. female.   Chief Complaint: abscess third toe right foot HPI: The patient is a 67 yo female here with her daughter for follow up of her right great toe amputation and second toe amputation 02/24/2018. She developed new ulceration and redness of her right 3rd toe and was started on Doxycycline last week. The edema and erythema has improved, but the ulcer is now tracking to the bone. She has been wearing her silver compression sock and trying to keep the foot elevated as much as possible.   Past Medical History:  Diagnosis Date  . Acute MI ([place]) 1999; 2007  . Anemia    hx  . Anginal pain ([place])   . Anxiety   . ARF (acute renal failure) ([place]) 06/2017   [facility]  . Arthritis    "generalized" (03/15/2014)  . CAD (coronary artery disease)    MI in 2000 - MI  2007 - treated bare metal stent (no nuclear since then as 9/11)  . Carotid artery disease ([place])   . CHF (congestive heart failure) ([place])   . Chronic diastolic heart failure (HCC)    a) ECHO (08/2013) EF 55-60% and RV function nl b) RHC (08/2013) RA 4, RV 30/5/7, PA 25/10 (16), PCWP 7, Fick CO/CI 6.3/2.7, PVR 1.5 WU, PA 61 and 66%  . Daily headache    "~ every other day; since I fell in June" (03/15/2014)  . Depression   . Dyslipidemia   . Dyspnea   . Exertional shortness of breath   . HTN (hypertension)   . Hypothyroidism   . Neuropathy   . Obesity   . Osteoarthritis   . Peripheral neuropathy   . PONV (postoperative nausea and vomiting)   . RBBB (right bundle branch block)    Old  . Stroke [facility])    mini strokes  . Syncope    likely due to low blood sugar  . Tachycardia    Sinus tachycardia  . Type II diabetes mellitus (HCC)    Type II  . Urinary incontinence   . Venous insufficiency     Past Surgical History:  Procedure Laterality Date  . ABDOMINAL HYSTERECTOMY  1980's  . AMPUTATION Right 02/24/2018   Procedure: RIGHT FOOT GREAT TOE AND 2ND TOE AMPUTATION;  Surgeon: Newt Minion,  MD;  Location: [place];  Service: Orthopedics;  Laterality: Right;  . CATARACT EXTRACTION, BILATERAL Bilateral ?2013  . COLONOSCOPY W/ POLYPECTOMY    . CORONARY ANGIOPLASTY WITH STENT PLACEMENT  1999; 2007   "1 + 1"  . EYE SURGERY Bilateral    [place]  . KNEE ARTHROSCOPY Left 10/25/2006  . RIGHT HEART CATH N/A 07/24/2017   Procedure: RIGHT HEART CATH;  Surgeon: Jolaine Artist, MD;  Location: [facility] CV LAB;  Service: Cardiovascular;  Laterality: N/A;  . RIGHT HEART CATHETERIZATION N/A 09/22/2013   Procedure: RIGHT HEART CATH;  Surgeon: Jolaine Artist, MD;  Location: [facility] CATH LAB;  Service: Cardiovascular;  Laterality: N/A;  . SHOULDER ARTHROSCOPY WITH OPEN ROTATOR CUFF REPAIR Right 03/14/2014   Procedure: RIGHT SHOULDER ARTHROSCOPY WITH BICEPS RELEASE, OPEN SUBSCAPULA REPAIR, OPEN SUPRASPINATUS REPAIR.;  Surgeon: Meredith Pel, MD;  Location: [place];  Service: Orthopedics;  Laterality: Right;  . TOE AMPUTATION Right 02/24/2018   GREAT TOE AND 2ND TOE AMPUTATION  . TUBAL LIGATION  1970's    Family History  Problem Relation Age of Onset  . Heart attack Mother 20   Social History:  reports that she quit smoking about 20 years ago. Her smoking use included cigarettes. She has a 96.00 pack-year smoking history. She has never used smokeless tobacco. She reports that she drank alcohol. She reports that she does not use drugs.  Allergies:  Allergies  Allergen Reactions  . Codeine Nausea And Vomiting    Medications Prior to Admission  Medication Sig Dispense Refill  . aspirin EC 81 MG tablet Take 81 mg by mouth daily.    . carvedilol (COREG) 12.5 MG tablet TAKE 1 TABLET BY MOUTH TWICE A DAY WITH A MEAL (Patient taking differently: Take 12.5 mg by mouth 2 (two) times daily. ) 90 tablet 1  . cholecalciferol (VITAMIN D) 1000 units tablet Take 1,000 Units by mouth daily.    . clopidogrel (PLAVIX) 75 MG tablet TAKE 1 TABLET BY MOUTH DAILY WITH BREAKFAST. 90 tablet 2  . doxycycline  (VIBRAMYCIN) 100 MG capsule Take 1 capsule (100 mg total) by mouth 2 (two) times daily. 28 capsule 1  . escitalopram (LEXAPRO) 20 MG tablet Take 1 tablet (20 mg total) by mouth daily. 90 tablet 2  . ferrous sulfate 325 (65 FE) MG tablet Take 325 mg by mouth daily with breakfast. Reported on 08/21/2015    . Insulin Human (INSULIN PUMP) SOLN Inject into the skin.    Marland Kitchen insulin NPH Human (HUMULIN N,NOVOLIN N) 100 UNIT/ML injection Inject 10 Units into the skin daily as needed (IN THE MORNIING FOR BLOOD SUGARS OVER 200 IN THE MORNING). Using insulin in Omnipod insulin pump system    . insulin regular (NOVOLIN R,HUMULIN R) 100 units/mL injection Inject 5-15 Units into the skin See admin instructions. With OMNIPOD Sliding scale 80-150 5 units, 150-200 7 units, 201-250 10 units, 251-300 12 units, 301-400 15 units.    Marland Kitchen levothyroxine (SYNTHROID, LEVOTHROID) 50 MCG tablet Take 1 tablet (50 mcg total) by mouth daily before breakfast. 90 tablet 1  . Magnesium Oxide 400 (240 Mg) MG TABS TAKE 1 TABLET BY MOUTH EVERY DAY (Patient taking differently: Take 400 mg by mouth daily. ) 90 tablet 3  . nitroGLYCERIN (NITROSTAT) 0.4 MG SL tablet PLACE 1 TABLET (0.4 MG TOTAL) UNDER THE TONGUE EVERY 5 (FIVE) MINUTES AS NEEDED FOR CHEST PAIN. 25 tablet 1  . nystatin (MYCOSTATIN/NYSTOP) powder Apply topically 4 (four) times daily. (Patient taking differently: Apply 1 g topically 4 (four) times daily as needed (for yeast). ) 60 g 3  . nystatin cream (MYCOSTATIN) Apply 1 application topically 2 (two) times daily. (Patient taking differently: Apply 1 application topically 2 (two) times daily as needed (yeast/irritation.). ) 60 g 2  . pregabalin (LYRICA) 150 MG capsule Take 1 capsule (150 mg total) by mouth 2 (two) times daily. 60 capsule 2  . rosuvastatin (CRESTOR) 20 MG tablet TAKE 1 TABLET BY MOUTH EVERY DAY (Patient taking differently: Take 20 mg by mouth. ) 90 tablet 3  . torsemide (DEMADEX) 20 MG tablet Take 3 tablets (60 mg  total) by mouth every morning AND 2 tablets (40 mg total) every evening. 150 tablet 3  . vitamin E (VITAMIN E) 1000 UNIT capsule Take 1,000 Units by mouth daily.    Marland Kitchen KLOR-CON M10 10 MEQ tablet TAKE 1 TABLET BY MOUTH EVERY MORNING WITH WATER PILL (Patient not taking: Reported on 04/28/2018) 90 tablet 2  . ONETOUCH VERIO test strip       Results for orders placed or performed during the hospital encounter of 04/30/18 (from the past 48 hour(s))  Glucose, capillary  Status: Abnormal   Collection Time: 04/30/18  5:52 AM  Result Value Ref Range   Glucose-Capillary 315 (H) 70 - 99 mg/dL   No results found.  Review of Systems  All other systems reviewed and are negative.   Blood pressure (!) 136/47, pulse 70, temperature 98.4 F (36.9 C), temperature source Oral, resp. rate 17, height 5\' 5"  (1.651 m), weight 124.7 kg, SpO2 100 %. Physical Exam Patient is alert, oriented, no adenopathy, well-dressed, normal affect, normal respiratory effort. The right 3rd has an ulcer over the distal tuft which tracks to the bone. There is necrosis over the distal tuft and drainage. There is erythema of the 3rd toe and edema. There is edema of the right lower leg. Palpable dorsalis pedis pulse.    Assessment/Plan 1. Amputated great toe, right ([name])   2. Idiopathic chronic venous hypertension of both lower extremities with ulcer and inflammation ([place])   3. Type 2 diabetes mellitus with diabetic polyneuropathy, with long-term current use of insulin ([place])     Plan: Plan to return to OR later this week for amputation of the remaining toes of the right foot. She will likely have a VAC placed at that time. She will follow up in the office post operatively .     Newt Minion, MD 04/30/2018, 6:55 AM

## 2018-04-30 NOTE — Anesthesia Procedure Notes (Signed)
Anesthesia Regional Block: Popliteal block   Pre-Anesthetic Checklist: ,, timeout performed, Correct Patient, Correct Site, Correct Laterality, Correct Procedure, Correct Position, site marked, Risks and benefits discussed,  Surgical consent,  Pre-op evaluation,  At surgeon's request and post-op pain management  Laterality: Right  Prep: chloraprep       Needles:  Injection technique: Single-shot  Needle Type: Echogenic Stimulator Needle     Needle Length: 9cm  Needle Gauge: 21     Additional Needles:   Procedures:,,,, ultrasound used (permanent image in chart),,,,  Narrative:  Start time: 04/30/2018 7:08 AM End time: 04/30/2018 7:11 AM Injection made incrementally with aspirations every 5 mL.  Performed by: Personally  Anesthesiologist: Lidia Collum, MD  Additional Notes: Monitors applied. Injection made in 5cc increments. No resistance to injection. Good needle visualization. Patient tolerated procedure well.

## 2018-04-30 NOTE — Anesthesia Procedure Notes (Signed)
Procedure Name: MAC Date/Time: 04/30/2018 7:50 AM Performed by: Lieutenant Diego, CRNA Pre-anesthesia Checklist: Patient identified, Emergency Drugs available, Suction available, Patient being monitored and Timeout performed Patient Re-evaluated:Patient Re-evaluated prior to induction Oxygen Delivery Method: Simple face mask Preoxygenation: Pre-oxygenation with 100% oxygen Induction Type: IV induction

## 2018-04-30 NOTE — Progress Notes (Signed)
Orthopedic Tech Progress Note Patient Details:  Susan [name] 08/25/1950 [id number]  Ortho Devices Type of Ortho Device: CAM walker Ortho Device/Splint Interventions: Application   Post Interventions Patient Tolerated: Well Instructions Provided: Care of device   [name] 04/30/2018, 9:08 AM

## 2018-04-30 NOTE — Anesthesia Postprocedure Evaluation (Signed)
Anesthesia Post Note  Patient: Chanda Laperle  Procedure(s) Performed: RIGHT TRANSMETATARSAL AMPUTATION (Right )     Patient location during evaluation: PACU Anesthesia Type: Regional Level of consciousness: awake and alert Pain management: pain level controlled Vital Signs Assessment: post-procedure vital signs reviewed and stable Respiratory status: spontaneous breathing, nonlabored ventilation and respiratory function stable Cardiovascular status: blood pressure returned to baseline and stable Postop Assessment: no apparent nausea or vomiting Anesthetic complications: no    Last Vitals:  Vitals:   04/30/18 0854 04/30/18 0950  BP:  123/60  Pulse: 63 62  Resp: 17 15  Temp:  36.7 C  SpO2: 100% 96%    Last Pain:  Vitals:   04/30/18 0900  TempSrc:   PainSc: 0-No pain                 Lidia Collum

## 2018-04-30 NOTE — Progress Notes (Signed)
0625 notified Dr.  Robert of pt's blood sugar 315. Pt. On an insulin pod running at basal rate.  Pt. Reported when she woke up and checked blood sugar  At 0500 it was 226. She gave 1/2 bolus whick was  5 units . Dr.  Robert deferred pt. To Dr. Kerin Perna. Notified Dr. Kerin Perna. Stated to keep insulin pump on.   0650 Blood sugar 296.

## 2018-04-30 NOTE — Progress Notes (Signed)
Per dr Christella Hartigan, re cbg 291, pt will take add'l insulin per home protocol, upon arrival home. No add'l insulin here. Patient aware, verbalizes understanding

## 2018-04-30 NOTE — Op Note (Signed)
04/30/2018  8:15 AM  PATIENT:  Susan [name]    PRE-OPERATIVE DIAGNOSIS:  Osteomyelitis Right 3rd toe  POST-OPERATIVE DIAGNOSIS:  Same  PROCEDURE:  RIGHT TRANSMETATARSAL AMPUTATION Application of Praveena wound VAC.  SURGEON:  Newt Minion, MD  PHYSICIAN ASSISTANT:None ANESTHESIA:   General  PREOPERATIVE INDICATIONS:  Susan [name] is a  67 y.o. 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    The risks benefits and alternatives were discussed with the patient preoperatively including but not limited to the risks of infection, bleeding, nerve injury, cardiopulmonary complications, the need for revision surgery, among others, and the patient was willing to proceed.  OPERATIVE IMPLANTS: Praveena wound VAC.  @ENCIMAGES @  OPERATIVE FINDINGS: Minimal petechial bleeding venous insufficiency.  OPERATIVE PROCEDURE: Patient was brought the operating room and underwent a general anesthetic.  After adequate levels anesthesia were obtained patient's right lower extremity was prepped using DuraPrep draped into a sterile field a timeout was called.  A fishmouth incision was made around the forefoot this was carried down to the metatarsals and the metatarsals were resected through the next 2 through 5.  There was minimal petechial bleeding.  There is no abscess.  The wound was irrigated with normal saline electrocautery was used for hemostasis the incision was closed using 2-0 nylon and a Praveena wound VAC was applied this had a good suction fit.  A compression wrap was applied for the venous insufficiency.  Patient was taken the PACU in stable condition.   DISCHARGE PLANNING:  Antibiotic duration: Preoperative antibiotics  Weightbearing: Touchdown weightbearing on the right  Pain medication: Prescription for Percocet  Dressing care/ Wound VAC: Follow-up in 1 week to remove the Praveena wound VAC  Ambulatory  devices: Walker  Discharge to: Home  Follow-up: In the office 1 week post operative.

## 2018-04-30 NOTE — Transfer of Care (Signed)
Immediate Anesthesia Transfer of Care Note  Patient: Susan [name]  Procedure(s) Performed: RIGHT TRANSMETATARSAL AMPUTATION (Right )  Patient Location: PACU  Anesthesia Type:MAC  Level of Consciousness: awake and alert   Airway & Oxygen Therapy: Patient Spontanous Breathing and Patient connected to face mask oxygen  Post-op Assessment: Report given to RN and Post -op Vital signs reviewed and stable  Post vital signs: Reviewed and stable  Last Vitals:  Vitals Value Taken Time  BP    Temp    Pulse 64 04/30/2018  8:10 AM  Resp 15 04/30/2018  8:10 AM  SpO2 100 % 04/30/2018  8:10 AM  Vitals shown include unvalidated device data.  Last Pain:  Vitals:   04/30/18 0636  TempSrc:   PainSc: 6       Patients Stated Pain Goal: 5 (16/10/96 0454)  Complications: No apparent anesthesia complications

## 2018-05-01 ENCOUNTER — Encounter (HOSPITAL_COMMUNITY): Payer: Self-pay | Admitting: Orthopedic Surgery

## 2018-05-03 ENCOUNTER — Other Ambulatory Visit: Payer: Self-pay

## 2018-05-03 DIAGNOSIS — L03114 Cellulitis of left upper limb: Secondary | ICD-10-CM | POA: Diagnosis not present

## 2018-05-03 DIAGNOSIS — E1142 Type 2 diabetes mellitus with diabetic polyneuropathy: Secondary | ICD-10-CM | POA: Diagnosis not present

## 2018-05-03 DIAGNOSIS — Z6841 Body Mass Index (BMI) 40.0 and over, adult: Secondary | ICD-10-CM | POA: Diagnosis not present

## 2018-05-03 DIAGNOSIS — I87331 Chronic venous hypertension (idiopathic) with ulcer and inflammation of right lower extremity: Secondary | ICD-10-CM | POA: Diagnosis not present

## 2018-05-03 DIAGNOSIS — I2510 Atherosclerotic heart disease of native coronary artery without angina pectoris: Secondary | ICD-10-CM | POA: Diagnosis not present

## 2018-05-03 DIAGNOSIS — Z89421 Acquired absence of other right toe(s): Secondary | ICD-10-CM | POA: Diagnosis not present

## 2018-05-03 DIAGNOSIS — I130 Hypertensive heart and chronic kidney disease with heart failure and stage 1 through stage 4 chronic kidney disease, or unspecified chronic kidney disease: Secondary | ICD-10-CM | POA: Diagnosis not present

## 2018-05-03 DIAGNOSIS — N184 Chronic kidney disease, stage 4 (severe): Secondary | ICD-10-CM | POA: Diagnosis not present

## 2018-05-03 DIAGNOSIS — Z89411 Acquired absence of right great toe: Secondary | ICD-10-CM | POA: Diagnosis not present

## 2018-05-03 DIAGNOSIS — Z7982 Long term (current) use of aspirin: Secondary | ICD-10-CM | POA: Diagnosis not present

## 2018-05-03 DIAGNOSIS — E039 Hypothyroidism, unspecified: Secondary | ICD-10-CM | POA: Diagnosis not present

## 2018-05-03 DIAGNOSIS — E1151 Type 2 diabetes mellitus with diabetic peripheral angiopathy without gangrene: Secondary | ICD-10-CM | POA: Diagnosis not present

## 2018-05-03 DIAGNOSIS — Z794 Long term (current) use of insulin: Secondary | ICD-10-CM | POA: Diagnosis not present

## 2018-05-03 DIAGNOSIS — I5032 Chronic diastolic (congestive) heart failure: Secondary | ICD-10-CM | POA: Diagnosis not present

## 2018-05-03 DIAGNOSIS — Z4781 Encounter for orthopedic aftercare following surgical amputation: Secondary | ICD-10-CM | POA: Diagnosis not present

## 2018-05-03 DIAGNOSIS — E1122 Type 2 diabetes mellitus with diabetic chronic kidney disease: Secondary | ICD-10-CM | POA: Diagnosis not present

## 2018-05-03 DIAGNOSIS — Z87891 Personal history of nicotine dependence: Secondary | ICD-10-CM | POA: Diagnosis not present

## 2018-05-03 DIAGNOSIS — Z7902 Long term (current) use of antithrombotics/antiplatelets: Secondary | ICD-10-CM | POA: Diagnosis not present

## 2018-05-03 DIAGNOSIS — M10042 Idiopathic gout, left hand: Secondary | ICD-10-CM | POA: Diagnosis not present

## 2018-05-03 NOTE — Patient Outreach (Signed)
[place] [facility]) Care Management  05/03/2018  Susan [name] 11-Jun-1950 [id number]    1st outreach attempt to the patient for initial assessment.  HIPAA verified by the patient.  Explained to the patient health coach role.  The patient stated that today was not a good day.  " I am having a lot of pain from my surgery."  The patient requested that I call her back.   Plan RN Health Coach will outreach the patient within thirty days.  [name] RN, BSN, [place] Direct Dial:  [phone]  Fax: [phone]

## 2018-05-05 ENCOUNTER — Other Ambulatory Visit: Payer: Self-pay | Admitting: Pharmacist

## 2018-05-05 ENCOUNTER — Ambulatory Visit: Payer: Self-pay | Admitting: Pharmacist

## 2018-05-05 NOTE — Patient Outreach (Addendum)
[place] [facility]) Care Management  05/05/2018  Susan [name] December 16, 1950 [id number]  Successful outreach to [name] today with HIPAA identifiers verified x2.  Patient states she is doing "okay" today, but still experiencing pain from her surgery (amputation 3 right toes).  She is taking Percocet for pain as prescribed that "takes the edge off".  She has a follow up appointment tomorrow with her surgeon to have her wound vac removed.  Reviewed BGs and insulin with patient.  She stated she is almost out of her OmniPods (insulin pod system-pod changes every 3 days).  She has been obtaining samples from Dr. Almetta Lovely office.  Successful care coordination call to Dr. Almetta Lovely office regarding insulin pods. Message was left with RN.  RN will call patient.  Called patient back to let her know that office will be calling her today and for patient to follow up to retrieve samples.  Patient knows to switch to subcutaneous insulin if pods are not available.  PLAN: -I will follow up with patient next month  Regina Eck, PharmD, [place]  [phone]

## 2018-05-06 ENCOUNTER — Encounter (INDEPENDENT_AMBULATORY_CARE_PROVIDER_SITE_OTHER): Payer: Self-pay | Admitting: Physician Assistant

## 2018-05-06 ENCOUNTER — Ambulatory Visit (INDEPENDENT_AMBULATORY_CARE_PROVIDER_SITE_OTHER): Payer: PPO | Admitting: Orthopedic Surgery

## 2018-05-06 VITALS — Ht 65.0 in | Wt 275.0 lb

## 2018-05-06 DIAGNOSIS — Z89431 Acquired absence of right foot: Secondary | ICD-10-CM

## 2018-05-07 ENCOUNTER — Ambulatory Visit: Payer: Self-pay

## 2018-05-09 ENCOUNTER — Encounter (INDEPENDENT_AMBULATORY_CARE_PROVIDER_SITE_OTHER): Payer: Self-pay | Admitting: Orthopedic Surgery

## 2018-05-09 NOTE — Progress Notes (Signed)
Office Visit Note   Patient: Susan [name]           Date of Birth: 10/27/1950           MRN: [id number] Visit Date: 05/06/2018              Requested by: Alycia Rossetti, MD [address] [place], [place] [address] PCP: Alycia Rossetti, MD  Chief Complaint  Patient presents with  . Right Foot - Routine Post Op    TMA of right foot      HPI: Patient is a 67 year old woman who presents 1 week status post right transmetatarsal amputation.  Assessment & Plan: Visit Diagnoses:  1. History of transmetatarsal amputation of right foot ([place])     Plan: The wound VAC is removed we will start soap and water cleansing nonweightbearing fracture boot for protection reevaluate at follow-up.  Follow-Up Instructions: Return in about 1 week (around 05/13/2018).   Ortho Exam  Patient is alert, oriented, no adenopathy, well-dressed, normal affect, normal respiratory effort. Examination there is some mild maceration along the surgical incision the wound edges are well approximated there is no cellulitis small amount of serosanguineous drainage.  Imaging: No results found. No images are attached to the encounter.  Labs: Lab Results  Component Value Date   HGBA1C 8.3 (H) 04/30/2018   HGBA1C 9.3 (H) 09/25/2017   HGBA1C 11.4 (H) 07/16/2017   CRP 8.4 (H) 03/05/2018   LABURIC 9.0 (H) 03/06/2018   REPTSTATUS 03/21/2018 FINAL 03/18/2018   CULT (A) 03/18/2018    20,000 COLONIES/mL KLEBSIELLA PNEUMONIAE 20,000 COLONIES/mL ENTEROCOCCUS FAECALIS    LABORGA KLEBSIELLA PNEUMONIAE (A) 03/18/2018   LABORGA ENTEROCOCCUS FAECALIS (A) 03/18/2018     Lab Results  Component Value Date   ALBUMIN 3.0 (L) 03/19/2018   ALBUMIN 3.8 03/18/2018   ALBUMIN 2.6 (L) 02/14/2018   LABURIC 9.0 (H) 03/06/2018    Body mass index is 45.76 kg/m.  Orders:  No orders of the defined types were placed in this encounter.  No orders of the defined types were placed in this encounter.     Procedures: No procedures performed  Clinical Data: No additional findings.  ROS:  All other systems negative, except as noted in the HPI. Review of Systems  Objective: Vital Signs: Ht 5\' 5"  (1.651 m)   Wt 275 lb (124.7 kg)   BMI 45.76 kg/m   Specialty Comments:  No specialty comments available.  PMFS History: Patient Active Problem List   Diagnosis Date Noted  . Pressure injury of skin 03/19/2018  . Syncope 03/18/2018  . Cellulitis of hand, left 03/06/2018  . Cellulitis 03/06/2018  . Amputated great toe, right ([place]) 02/24/2018  . Osteomyelitis of second toe of right foot ([place])   . Toe osteomyelitis, right ([place])   . 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 ([place])   . Acute on chronic diastolic (congestive) heart failure ([place]) 02/13/2018  . PVD (peripheral vascular disease) ([place]) 10/26/2017  . Hypothyroid 07/27/2017  . PAH (pulmonary artery hypertension) ([place])   . Impaired ambulation 07/19/2017  . Hyperosmolar non-ketotic state in patient with type 2 diabetes mellitus ([place]) 07/15/2017  . Leg cramps 02/27/2017  . Peripheral edema 01/12/2017  . Diabetic neuropathy ([place]) 11/12/2016  . 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 mellitus ([place]) 10/24/2015  . Anemia 10/03/2015  . Generalized anxiety disorder 10/03/2015  . Insomnia 10/03/2015  . Diabetes mellitus type II, uncontrolled ([place]) 06/07/2015  . Chronic diastolic  CHF (congestive heart failure) ([place]) 06/07/2015  . Non compliance with medical treatment 04/17/2014  . Rotator cuff tear 03/14/2014  . Morbid obesity ([place]) 09/23/2013  . Hypotension 12/25/2012  . Urinary incontinence   . MDD (major depressive disorder) 11/12/2010  . RBBB (right bundle branch block)   . CAD (coronary artery disease)   . Hyperlipemia 01/22/2009  . Essential hypertension 01/22/2009   Past Medical History:  Diagnosis Date  . Acute MI ([place]) 1999; 2007  . Anemia    hx  . Anginal pain ([place])   . Anxiety   . ARF (acute renal  failure) ([place]) 06/2017   [facility]  . Arthritis    "generalized" (03/15/2014)  . CAD (coronary artery disease)    MI in 2000 - MI  2007 - treated bare metal stent (no nuclear since then as 9/11)  . Carotid artery disease ([place])   . CHF (congestive heart failure) ([place])   . Chronic diastolic heart failure (HCC)    a) ECHO (08/2013) EF 55-60% and RV function nl b) RHC (08/2013) RA 4, RV 30/5/7, PA 25/10 (16), PCWP 7, Fick CO/CI 6.3/2.7, PVR 1.5 WU, PA 61 and 66%  . Daily headache    "~ every other day; since I fell in June" (03/15/2014)  . Depression   . Dyslipidemia   . Dyspnea   . Exertional shortness of breath   . HTN (hypertension)   . Hypothyroidism   . Neuropathy   . Obesity   . Osteoarthritis   . Peripheral neuropathy   . PONV (postoperative nausea and vomiting)   . RBBB (right bundle branch block)    Old  . Stroke [facility])    mini strokes  . Syncope    likely due to low blood sugar  . Tachycardia    Sinus tachycardia  . Type II diabetes mellitus (HCC)    Type II  . Urinary incontinence   . Venous insufficiency     Family History  Problem Relation Age of Onset  . Heart attack Mother 48    Past Surgical History:  Procedure Laterality Date  . ABDOMINAL HYSTERECTOMY  1980's  . AMPUTATION Right 02/24/2018   Procedure: RIGHT FOOT GREAT TOE AND 2ND TOE AMPUTATION;  Surgeon: Newt Minion, MD;  Location: [facility];  Service: Orthopedics;  Laterality: Right;  . AMPUTATION Right 04/30/2018   Procedure: RIGHT TRANSMETATARSAL AMPUTATION;  Surgeon: Newt Minion, MD;  Location: [facility];  Service: Orthopedics;  Laterality: Right;  . CATARACT EXTRACTION, BILATERAL Bilateral ?2013  . COLONOSCOPY W/ POLYPECTOMY    . CORONARY ANGIOPLASTY WITH STENT PLACEMENT  1999; 2007   "1 + 1"  . EYE SURGERY Bilateral    lazer  . KNEE ARTHROSCOPY Left 10/25/2006  . RIGHT HEART CATH N/A 07/24/2017   Procedure: RIGHT HEART CATH;  Surgeon: Jolaine Artist, MD;  Location: [facility] CV LAB;   Service: Cardiovascular;  Laterality: N/A;  . RIGHT HEART CATHETERIZATION N/A 09/22/2013   Procedure: RIGHT HEART CATH;  Surgeon: Jolaine Artist, MD;  Location: [facility] CATH LAB;  Service: Cardiovascular;  Laterality: N/A;  . SHOULDER ARTHROSCOPY WITH OPEN ROTATOR CUFF REPAIR Right 03/14/2014   Procedure: RIGHT SHOULDER ARTHROSCOPY WITH BICEPS RELEASE, OPEN SUBSCAPULA REPAIR, OPEN SUPRASPINATUS REPAIR.;  Surgeon: Meredith Pel, MD;  Location: [facility];  Service: Orthopedics;  Laterality: Right;  . TOE AMPUTATION Right 02/24/2018   GREAT TOE AND 2ND TOE AMPUTATION  . TUBAL LIGATION  1970's   Social History  Occupational History    Employer: UNEMPLOYED  Tobacco Use  . Smoking status: Former Smoker    Packs/day: 3.00    Years: 32.00    Pack years: 96.00    Types: Cigarettes    Last attempt to quit: 10/24/1997    Years since quitting: 20.5  . Smokeless tobacco: Never Used  Substance and Sexual Activity  . Alcohol use: Not Currently    Comment: "might have 2-3 daiquiris in the summer"  . Drug use: No  . Sexual activity: Never

## 2018-05-10 ENCOUNTER — Inpatient Hospital Stay (INDEPENDENT_AMBULATORY_CARE_PROVIDER_SITE_OTHER): Payer: PPO | Admitting: Orthopedic Surgery

## 2018-05-11 DIAGNOSIS — Z89431 Acquired absence of right foot: Secondary | ICD-10-CM | POA: Diagnosis not present

## 2018-05-11 DIAGNOSIS — I5030 Unspecified diastolic (congestive) heart failure: Secondary | ICD-10-CM | POA: Diagnosis not present

## 2018-05-11 DIAGNOSIS — I129 Hypertensive chronic kidney disease with stage 1 through stage 4 chronic kidney disease, or unspecified chronic kidney disease: Secondary | ICD-10-CM | POA: Diagnosis not present

## 2018-05-11 DIAGNOSIS — D631 Anemia in chronic kidney disease: Secondary | ICD-10-CM | POA: Diagnosis not present

## 2018-05-11 DIAGNOSIS — N184 Chronic kidney disease, stage 4 (severe): Secondary | ICD-10-CM | POA: Diagnosis not present

## 2018-05-11 DIAGNOSIS — E1122 Type 2 diabetes mellitus with diabetic chronic kidney disease: Secondary | ICD-10-CM | POA: Diagnosis not present

## 2018-05-13 ENCOUNTER — Encounter (INDEPENDENT_AMBULATORY_CARE_PROVIDER_SITE_OTHER): Payer: Self-pay | Admitting: Physician Assistant

## 2018-05-13 ENCOUNTER — Ambulatory Visit (INDEPENDENT_AMBULATORY_CARE_PROVIDER_SITE_OTHER): Payer: PPO | Admitting: Physician Assistant

## 2018-05-13 VITALS — Ht 65.0 in | Wt 275.0 lb

## 2018-05-13 DIAGNOSIS — Z794 Long term (current) use of insulin: Secondary | ICD-10-CM

## 2018-05-13 DIAGNOSIS — L97929 Non-pressure chronic ulcer of unspecified part of left lower leg with unspecified severity: Secondary | ICD-10-CM

## 2018-05-13 DIAGNOSIS — L97919 Non-pressure chronic ulcer of unspecified part of right lower leg with unspecified severity: Secondary | ICD-10-CM

## 2018-05-13 DIAGNOSIS — I87333 Chronic venous hypertension (idiopathic) with ulcer and inflammation of bilateral lower extremity: Secondary | ICD-10-CM

## 2018-05-13 DIAGNOSIS — Z89431 Acquired absence of right foot: Secondary | ICD-10-CM

## 2018-05-13 DIAGNOSIS — I5032 Chronic diastolic (congestive) heart failure: Secondary | ICD-10-CM

## 2018-05-13 DIAGNOSIS — E1142 Type 2 diabetes mellitus with diabetic polyneuropathy: Secondary | ICD-10-CM

## 2018-05-13 MED ORDER — GABAPENTIN 100 MG PO CAPS: 300.0000 mg | ORAL_CAPSULE | Freq: Three times a day (TID) | ORAL | 2 refills | Status: DC

## 2018-05-13 MED ORDER — OXYCODONE-ACETAMINOPHEN 5-325 MG PO TABS: 1.0000 | ORAL_TABLET | ORAL | 0 refills | Status: DC | PRN

## 2018-05-14 ENCOUNTER — Encounter (INDEPENDENT_AMBULATORY_CARE_PROVIDER_SITE_OTHER): Payer: Self-pay | Admitting: Physician Assistant

## 2018-05-14 NOTE — Progress Notes (Signed)
Office Visit Note   Patient: Susan [name]           Date of Birth: 1950-09-20           MRN: [id number] Visit Date: 05/13/2018              Requested by: Alycia Rossetti, MD [id number] [place], [place] [address] PCP: Alycia Rossetti, MD  Chief Complaint  Patient presents with  . Right Foot - Routine Post Op      HPI: The patient a 67 yo woman here with friends for follow up of her right transmetatarsal amputation on 04/30/2018. She has been having a lot of phantom pain over the right little toes which have been amputated. She is on Lyrica chronically. She has been walking with a walker, limiting her right foot weight bearing as possible with fracture boot. She reports her kidney function has been worsening and her swelling in her legs is worse at times. Her diuretic medications have been increased and this is causing her to have to urinate frequently.   Judd Gaudier & Plan: Visit Diagnoses:  1. History of transmetatarsal amputation of right foot ([place])   2. Idiopathic chronic venous hypertension of both lower extremities with ulcer and inflammation ([place])   3. Type 2 diabetes mellitus with diabetic polyneuropathy, with long-term current use of insulin ([place])   4. Chronic diastolic CHF (congestive heart failure) (HCC)     Plan: Continue to limit weight bearing as much as possible. May wash the foot with soap and water daily. Percocet refilled this visit. Instructed patient to work on Achilles stretching as well. Also recommended she wear her Vive silver compression sock. Follow up in 2 weeks or sooner if difficulties in the interim.   Follow-Up Instructions: Return in about 2 weeks (around 05/27/2018).   Ortho Exam  Patient is alert, oriented, no adenopathy, well-dressed, normal affect, normal respiratory effort. The right transmetatarsal amputation site is intact, but sutures under tension. Edema of the foot noted. No cellulitis or infection.  Imaging: No results found. No  images are attached to the encounter.  Labs: Lab Results  Component Value Date   HGBA1C 8.3 (H) 04/30/2018   HGBA1C 9.3 (H) 09/25/2017   HGBA1C 11.4 (H) 07/16/2017   CRP 8.4 (H) 03/05/2018   LABURIC 9.0 (H) 03/06/2018   REPTSTATUS 03/21/2018 FINAL 03/18/2018   CULT (A) 03/18/2018    20,000 COLONIES/mL KLEBSIELLA PNEUMONIAE 20,000 COLONIES/mL ENTEROCOCCUS FAECALIS    LABORGA KLEBSIELLA PNEUMONIAE (A) 03/18/2018   LABORGA ENTEROCOCCUS FAECALIS (A) 03/18/2018     Lab Results  Component Value Date   ALBUMIN 3.0 (L) 03/19/2018   ALBUMIN 3.8 03/18/2018   ALBUMIN 2.6 (L) 02/14/2018   LABURIC 9.0 (H) 03/06/2018    Body mass index is 45.76 kg/m.  Orders:  No orders of the defined types were placed in this encounter.  Meds ordered this encounter  Medications  . oxyCODONE-acetaminophen (PERCOCET/ROXICET) 5-325 MG tablet    Sig: Take 1 tablet by mouth every 4 (four) hours as needed.    Dispense:  30 tablet    Refill:  0  . DISCONTD: gabapentin (NEURONTIN) 100 MG capsule    Sig: Take 3 capsules (300 mg total) by mouth 3 (three) times daily.    Dispense:  90 capsule    Refill:  2    Start with 300 mg at bedtime and may gradually add in daytime doses if not over sedated     Procedures: No  procedures performed  Clinical Data: No additional findings.  ROS:  All other systems negative, except as noted in the HPI. Review of Systems  Objective: Vital Signs: Ht 5\' 5"  (1.651 m)   Wt 275 lb (124.7 kg)   BMI 45.76 kg/m   Specialty Comments:  No specialty comments available.  PMFS History: Patient Active Problem List   Diagnosis Date Noted  . Pressure injury of skin 03/19/2018  . Syncope 03/18/2018  . Cellulitis of hand, left 03/06/2018  . Cellulitis 03/06/2018  . Amputated great toe, right ([place]) 02/24/2018  . Osteomyelitis of second toe of right foot ([place])   . Toe osteomyelitis, right ([place])   . 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 ([place])   .  Acute on chronic diastolic (congestive) heart failure ([place]) 02/13/2018  . PVD (peripheral vascular disease) ([place]) 10/26/2017  . Hypothyroid 07/27/2017  . PAH (pulmonary artery hypertension) ([place])   . Impaired ambulation 07/19/2017  . Hyperosmolar non-ketotic state in patient with type 2 diabetes mellitus ([place]) 07/15/2017  . Leg cramps 02/27/2017  . Peripheral edema 01/12/2017  . Diabetic neuropathy ([place]) 11/12/2016  . 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 mellitus ([place]) 10/24/2015  . Anemia 10/03/2015  . Generalized anxiety disorder 10/03/2015  . Insomnia 10/03/2015  . Diabetes mellitus type II, uncontrolled ([place]) 06/07/2015  . Chronic diastolic CHF (congestive heart failure) ([place]) 06/07/2015  . Non compliance with medical treatment 04/17/2014  . Rotator cuff tear 03/14/2014  . Morbid obesity ([place]) 09/23/2013  . Hypotension 12/25/2012  . Urinary incontinence   . MDD (major depressive disorder) 11/12/2010  . RBBB (right bundle branch block)   . CAD (coronary artery disease)   . Hyperlipemia 01/22/2009  . Essential hypertension 01/22/2009   Past Medical History:  Diagnosis Date  . Acute MI ([place]) 1999; 2007  . Anemia    hx  . Anginal pain ([place])   . Anxiety   . ARF (acute renal failure) ([place]) 06/2017   [facility]  . Arthritis    "generalized" (03/15/2014)  . CAD (coronary artery disease)    MI in 2000 - MI  2007 - treated bare metal stent (no nuclear since then as 9/11)  . Carotid artery disease ([place])   . CHF (congestive heart failure) ([place])   . Chronic diastolic heart failure (HCC)    a) ECHO (08/2013) EF 55-60% and RV function nl b) RHC (08/2013) RA 4, RV 30/5/7, PA 25/10 (16), PCWP 7, Fick CO/CI 6.3/2.7, PVR 1.5 WU, PA 61 and 66%  . Daily headache    "~ every other day; since I fell in June" (03/15/2014)  . Depression   . Dyslipidemia   . Dyspnea   . Exertional shortness of breath   . HTN (hypertension)   . Hypothyroidism   . Neuropathy   . Obesity   .  Osteoarthritis   . Peripheral neuropathy   . PONV (postoperative nausea and vomiting)   . RBBB (right bundle branch block)    Old  . Stroke [facility])    mini strokes  . Syncope    likely due to low blood sugar  . Tachycardia    Sinus tachycardia  . Type II diabetes mellitus (HCC)    Type II  . Urinary incontinence   . Venous insufficiency     Family History  Problem Relation Age of Onset  . Heart attack Mother 48    Past Surgical History:  Procedure Laterality Date  . ABDOMINAL HYSTERECTOMY  1980's  . AMPUTATION Right 02/24/2018   Procedure: RIGHT FOOT GREAT TOE AND 2ND TOE AMPUTATION;  Surgeon: Newt Minion, MD;  Location: [facility];  Service: Orthopedics;  Laterality: Right;  . AMPUTATION Right 04/30/2018   Procedure: RIGHT TRANSMETATARSAL AMPUTATION;  Surgeon: Newt Minion, MD;  Location: [facility];  Service: Orthopedics;  Laterality: Right;  . CATARACT EXTRACTION, BILATERAL Bilateral ?2013  . COLONOSCOPY W/ POLYPECTOMY    . CORONARY ANGIOPLASTY WITH STENT PLACEMENT  1999; 2007   "1 + 1"  . EYE SURGERY Bilateral    lazer  . KNEE ARTHROSCOPY Left 10/25/2006  . RIGHT HEART CATH N/A 07/24/2017   Procedure: RIGHT HEART CATH;  Surgeon: Jolaine Artist, MD;  Location: [facility] CV LAB;  Service: Cardiovascular;  Laterality: N/A;  . RIGHT HEART CATHETERIZATION N/A 09/22/2013   Procedure: RIGHT HEART CATH;  Surgeon: Jolaine Artist, MD;  Location: [facility] CATH LAB;  Service: Cardiovascular;  Laterality: N/A;  . SHOULDER ARTHROSCOPY WITH OPEN ROTATOR CUFF REPAIR Right 03/14/2014   Procedure: RIGHT SHOULDER ARTHROSCOPY WITH BICEPS RELEASE, OPEN SUBSCAPULA REPAIR, OPEN SUPRASPINATUS REPAIR.;  Surgeon: Meredith Pel, MD;  Location: [facility];  Service: Orthopedics;  Laterality: Right;  . TOE AMPUTATION Right 02/24/2018   GREAT TOE AND 2ND TOE AMPUTATION  . TUBAL LIGATION  1970's   Social History   Occupational History    Employer: UNEMPLOYED  Tobacco Use  . Smoking status: Former  Smoker    Packs/day: 3.00    Years: 32.00    Pack years: 96.00    Types: Cigarettes    Last attempt to quit: 10/24/1997    Years since quitting: 20.5  . Smokeless tobacco: Never Used  Substance and Sexual Activity  . Alcohol use: Not Currently    Comment: "might have 2-3 daiquiris in the summer"  . Drug use: No  . Sexual activity: Never

## 2018-05-17 ENCOUNTER — Other Ambulatory Visit: Payer: Self-pay

## 2018-05-17 ENCOUNTER — Ambulatory Visit: Payer: PPO | Admitting: Family Medicine

## 2018-05-17 NOTE — Patient Outreach (Signed)
[facility]) Care Management  05/17/2018  Suprena Travaglini 1950-08-19 [id number]    2nd outreach attempt to the patient for initial assessment.Marland Kitchen  HIPAA verified.  The patient states that she is unalbe to complete the initial assessment today.  She is not feeling well and is waiting to schedule an appointment with her doctors office.  She asked if I could call her back at a later time.  Plan:  RN Health Coach will outreach the patient within the next thirty days.  [name] RN, BSN, [place] Direct Dial:  [phone]  Fax: [phone]

## 2018-05-27 ENCOUNTER — Telehealth: Payer: Self-pay | Admitting: *Deleted

## 2018-05-27 ENCOUNTER — Encounter (INDEPENDENT_AMBULATORY_CARE_PROVIDER_SITE_OTHER): Payer: Self-pay | Admitting: Orthopedic Surgery

## 2018-05-27 ENCOUNTER — Ambulatory Visit (INDEPENDENT_AMBULATORY_CARE_PROVIDER_SITE_OTHER): Payer: PPO | Admitting: Orthopedic Surgery

## 2018-05-27 VITALS — Ht 65.0 in | Wt 275.0 lb

## 2018-05-27 DIAGNOSIS — Z89431 Acquired absence of right foot: Secondary | ICD-10-CM

## 2018-05-27 NOTE — Progress Notes (Signed)
fPatient ID: Susan [name], female   DOB: [birth year], 68 y.o.   MRN: [id number]   PCP: Dr Edilia Bo  Cardiologist: Dr Myrtis Ser  Endocrinologist: Dr Lurene Shadow  Primary HF Cardiologist: Dr. Gala Romney  Nephrology: Dr Signe Colt  [facility] Patient  HPI: Susan [name] is a 68 y.o. female with a history of RBBB, DM, Hypothyroid, diastolic heart failure, CAD MI 2000/2007 BMS 2007, TIA, obesity.    Admitted June 1st through June 3rd, 2017 with altered mental status, hypothermia, and weakness. Diuretics initially held and later restarted.   Admitted 2/27-07/28/17 for AKI. Torsemide was held and restarted at 60 mg BID. She had RHC 07/24/17 that showed mild PAH with evidence of RV strain likely due to OHS/OSA. She was discharged to SNF.  Admitted 01/2018 with A/C diastolic HF. Diuresed with IV lasix.   Admitted 10/2-10/4//2019 for amputation of right first and second toes with Dr Lajoyce Corners.   Admitted 10/11-10/17/19 with LUE cellulitis. Treated with IV abx. Transitioned to oral antibiotics.   Admitted 10/24-10/27/19 with syncope. She was orthostatic. Had AKI with creatinine up to 3.33 and required IVF. Renal US showed no hydronephrosis. Losartan and imdur were DC'd. Creatinine 1.76 on day of DC.   She returns today for regular follow up. Overall doing fine. She had 3 more toes amputated on right foot since she was last here. She gets SOB after walking short distances, but this is at baseline. She is not very active due to amputations. She has some BLE edema, R>L. Wears TED hose at all times. She has chronic 3 pillow orthopnea. Denies PND. Denies CP or dizziness. Good UOP with torsemide. Has a scale but has not been weighing because she has a hard time with her orthopedic boots. Weight down 4 lbs on our scale. Now taking torsemide 60 mg am, 40 mg pm. She has not taken any medications yet today, but generally dose not miss any. Limits fluid and salt intake. Followed by [facility] RN. Unable to start PT yet per ortho.   ECHO 07/11/2015-  50-55%.  Grade I DD Echo 07/23/17: EF 55-60%, grade 1 DD Echo 03/19/18: EF 55-60%, grade 1 DD  RHC 07/24/17: RA = 17 RV = 48/15 PA = 49/11 (30) PCW = 18 Fick cardiac output/index = 7.0/3.0 PVR = 0.5 WU Ao sat = 97% PA sat = 62%, 64% 1. Normal left-sided pressures 2. Mild PAH with evidence of RV strain likely due to OHS/OSA 3. Normal cardiac output  RHC 09/22/13: RA = 4  RV = 30/5/7  PA = 25/10 (16)  PCW = 7  Fick cardiac output/index = 6.3/2.7  PVR = 1.5 WU   FA sat = 93%  PA sat = 61%, 66%   ABI 11/04/2017 Right: The right toe-brachial index is normal. RT great toe pressure = 127 mmHg. Although ankle brachial indices are within normal limits (0.95-1.29), arterial Doppler waveforms at the ankle suggest some component of arterial occlusive disease. Left: The left toe-brachial index is abnormal. LT Great toe pressure = 115 mmHg. Although ankle brachial indices are within normal limits (0.95-1.29), arterial Doppler waveforms at the ankle suggest some component of arterial occlusive disease.  SH: Former smoker quit 15 year ago. Does drink alcohol. She is disabled. Lives at home with her husband and disabled son - Husband is truck Hospital doctor. Son has a brain injury after he attempted suicide  A few years ago. .    FH: Mom MI  Review of systems complete and found to be negative unless listed in  HPI.   Past Medical History:  Diagnosis Date  . Acute MI (HCC) 1999; 2007  . Anemia    hx  . Anginal pain (HCC)   . Anxiety   . ARF (acute renal failure) (HCC) 06/2017   [name]  . Arthritis    "generalized" (03/15/2014)  . CAD (coronary artery disease)    MI in 2000 - MI  2007 - treated bare metal stent (no nuclear since then as 9/11)  . Carotid artery disease (HCC)   . CHF (congestive heart failure) (HCC)   . Chronic diastolic heart failure (HCC)    a) ECHO (08/2013) EF 55-60% and RV function nl b) RHC (08/2013) RA 4, RV 30/5/7, PA 25/10 (16), PCWP 7, Fick CO/CI 6.3/2.7,  PVR 1.5 WU, PA 61 and 66%  . Daily headache    "~ every other day; since I fell in June" (03/15/2014)  . Depression   . Dyslipidemia   . Dyspnea   . Exertional shortness of breath   . HTN (hypertension)   . Hypothyroidism   . Neuropathy   . Obesity   . Osteoarthritis   . Peripheral neuropathy   . PONV (postoperative nausea and vomiting)   . RBBB (right bundle branch block)    Old  . Stroke [facility])    mini strokes  . Syncope    likely due to low blood sugar  . Tachycardia    Sinus tachycardia  . Type II diabetes mellitus (HCC)    Type II  . Urinary incontinence   . Venous insufficiency     Current Outpatient Medications  Medication Sig Dispense Refill  . aspirin EC 81 MG tablet Take 81 mg by mouth daily.    . carvedilol (COREG) 12.5 MG tablet TAKE 1 TABLET BY MOUTH TWICE A DAY WITH A MEAL (Patient taking differently: Take 12.5 mg by mouth 2 (two) times daily. ) 90 tablet 1  . cholecalciferol (VITAMIN D) 1000 units tablet Take 1,000 Units by mouth daily.    . clopidogrel (PLAVIX) 75 MG tablet TAKE 1 TABLET BY MOUTH DAILY WITH BREAKFAST. 90 tablet 2  . doxycycline (VIBRAMYCIN) 100 MG capsule Take 1 capsule (100 mg total) by mouth 2 (two) times daily. 28 capsule 1  . escitalopram (LEXAPRO) 20 MG tablet Take 1 tablet (20 mg total) by mouth daily. 90 tablet 2  . ferrous sulfate 325 (65 FE) MG tablet Take 325 mg by mouth daily with breakfast. Reported on 08/21/2015    . Insulin Human (INSULIN PUMP) SOLN Inject into the skin.    Marland Kitchen insulin NPH Human (HUMULIN N,NOVOLIN N) 100 UNIT/ML injection Inject 10 Units into the skin daily as needed (IN THE MORNIING FOR BLOOD SUGARS OVER 200 IN THE MORNING). Using insulin in Omnipod insulin pump system    . insulin regular (NOVOLIN R,HUMULIN R) 100 units/mL injection Inject 5-15 Units into the skin See admin instructions. With OMNIPOD Sliding scale 80-150 5 units, 150-200 7 units, 201-250 10 units, 251-300 12 units, 301-400 15 units.    Marland Kitchen KLOR-CON  M10 10 MEQ tablet TAKE 1 TABLET BY MOUTH EVERY MORNING WITH WATER PILL 90 tablet 2  . levothyroxine (SYNTHROID, LEVOTHROID) 50 MCG tablet Take 1 tablet (50 mcg total) by mouth daily before breakfast. 90 tablet 1  . Magnesium Oxide 400 (240 Mg) MG TABS TAKE 1 TABLET BY MOUTH EVERY DAY (Patient taking differently: Take 400 mg by mouth daily. ) 90 tablet 3  . nitroGLYCERIN (NITROSTAT) 0.4 MG SL tablet  PLACE 1 TABLET (0.4 MG TOTAL) UNDER THE TONGUE EVERY 5 (FIVE) MINUTES AS NEEDED FOR CHEST PAIN. 25 tablet 1  . nystatin (MYCOSTATIN/NYSTOP) powder Apply topically 4 (four) times daily. (Patient taking differently: Apply 1 g topically 4 (four) times daily as needed (for yeast). ) 60 g 3  . nystatin cream (MYCOSTATIN) Apply 1 application topically 2 (two) times daily. (Patient taking differently: Apply 1 application topically 2 (two) times daily as needed (yeast/irritation.). ) 60 g 2  . ONETOUCH VERIO test strip     . oxyCODONE-acetaminophen (PERCOCET/ROXICET) 5-325 MG tablet Take 1 tablet by mouth every 4 (four) hours as needed. 30 tablet 0  . pregabalin (LYRICA) 150 MG capsule Take 1 capsule (150 mg total) by mouth 2 (two) times daily. 60 capsule 2  . rosuvastatin (CRESTOR) 20 MG tablet TAKE 1 TABLET BY MOUTH EVERY DAY (Patient taking differently: Take 20 mg by mouth. ) 90 tablet 3  . torsemide (DEMADEX) 20 MG tablet Take 3 tablets (60 mg total) by mouth every morning AND 2 tablets (40 mg total) every evening. 150 tablet 3  . vitamin E (VITAMIN E) 1000 UNIT capsule Take 1,000 Units by mouth daily.     No current facility-administered medications for this encounter.     Vitals:   05/31/18 1457  BP: (!) 146/102  Pulse: 85  SpO2: 95%  Weight: 114.1 kg (251 lb 9.6 oz)   Wt Readings from Last 3 Encounters:  05/31/18 114.1 kg (251 lb 9.6 oz)  05/27/18 124.7 kg (275 lb)  05/13/18 124.7 kg (275 lb)    PHYSICAL EXAM: General: No resp difficulty. Obese. Arrived in wheelchair.  HEENT: Normal Neck:  Supple. JVP 5-6. Carotids 2+ bilat; no bruits. No thyromegaly or nodule noted. Cor: PMI nondisplaced. RRR, No M/G/R noted Lungs: CTAB, normal effort. Abdomen: Soft, non-tender, non-distended, no HSM. No bruits or masses. +BS  Extremities: No cyanosis, clubbing, or rash. R and LLE TED hose. No edema. Has ortho boot to right and left foot.  Neuro: Alert & orientedx3, cranial nerves grossly intact. moves all 4 extremities w/o difficulty. Affect pleasant   ASSESSMENT & PLAN: 1. Chronic Diastolic Heart Failure: NICM, Echo 06/2017: EF 55-60%, grade 1 DD - Echo 03/19/18: EF 55-60%, grade 1 DD - NYHA IIIb.  - Volume status stable on exam. - Continue torsemide 60 mg am, 40 mg pm. BMET today.  - No spiro with creatinine >2.0 - Discussed fluid restrictions and low salt food choices.   2.  Uncontrolled DM:  - Follows closely with Dr Lurene Shadow. Has an insulin pump.   3. CAD, angina - No s/s ischemia.  - Continue statin and asa - Patient has a history of CAD with BMS in 2007.   - She had a low risk stress test 01/2017 - Continue Coreg 12.5 mg BID.   4. Obesity: - Body mass index is 41.87 kg/m.   6. HTN - Elevated today, but has not taken any medications. SBP 120-130s generally at other office appointments. Will ask [facility] RN to monitor and call us for medication titration if SBP >140.  7. CKD Stage III - Followed by Dr Signe Colt. Pt saw her recently but does not recall what she said.  - Last creatinine 2.31 on 12/6. Check BMET today.   8. Day time fatigue - Refuses sleep study. No change.   9. R Foot Cellulitis > osteomyelitis - s/p right great toe amputation 02/24/18. S/p amputation right 3rd toe 04/30/18. - Followed by Dr Lajoyce Corners.  Still in boots.   BP elevated, but hasn't taken medications yet today. Will have HH RN monitor and let us know if SBP running >140. Volume stable. BMET today. Follow up in 3 months.   Alford Highland, NP 3:01 PM  Greater than 50% of the 25 minute visit was  spent in counseling/coordination of care regarding disease state education, salt/fluid restriction, sliding scale diuretics, and medication compliance.

## 2018-05-27 NOTE — Telephone Encounter (Signed)
Late documentation for 13/31/2019:   Received call from [name] [facility] SN with [place] [phone] telephone.   Requested VO to extend [facility] SN services 1x weekly x4 weeks for education regarding DM. CHF, and recent amputation.   VO given.

## 2018-05-28 ENCOUNTER — Ambulatory Visit: Payer: Self-pay | Admitting: Licensed Clinical Social Worker

## 2018-05-28 DIAGNOSIS — H43813 Vitreous degeneration, bilateral: Secondary | ICD-10-CM | POA: Diagnosis not present

## 2018-05-28 DIAGNOSIS — H3582 Retinal ischemia: Secondary | ICD-10-CM | POA: Diagnosis not present

## 2018-05-28 DIAGNOSIS — E113513 Type 2 diabetes mellitus with proliferative diabetic retinopathy with macular edema, bilateral: Secondary | ICD-10-CM | POA: Diagnosis not present

## 2018-05-28 DIAGNOSIS — H43392 Other vitreous opacities, left eye: Secondary | ICD-10-CM | POA: Diagnosis not present

## 2018-05-28 NOTE — Telephone Encounter (Signed)
Agree with below. Addendum:    I believe that she has limits with moving and including, toileting, bathing, feeding, dressing and grooming. I believe the power wheelchair is needed for pt to be able to perform ADL's in her home

## 2018-05-31 ENCOUNTER — Ambulatory Visit (HOSPITAL_COMMUNITY)
Admission: RE | Admit: 2018-05-31 | Discharge: 2018-05-31 | Disposition: A | Discharge status: Home / Self Care | Payer: PPO | Source: Ambulatory Visit | Attending: Internal Medicine | Admitting: Internal Medicine

## 2018-05-31 ENCOUNTER — Encounter (INDEPENDENT_AMBULATORY_CARE_PROVIDER_SITE_OTHER): Payer: Self-pay | Admitting: Orthopedic Surgery

## 2018-05-31 VITALS — BP 146/102 | HR 85 | Wt 251.6 lb

## 2018-05-31 DIAGNOSIS — I130 Hypertensive heart and chronic kidney disease with heart failure and stage 1 through stage 4 chronic kidney disease, or unspecified chronic kidney disease: Secondary | ICD-10-CM | POA: Insufficient documentation

## 2018-05-31 DIAGNOSIS — Z87891 Personal history of nicotine dependence: Secondary | ICD-10-CM | POA: Diagnosis not present

## 2018-05-31 DIAGNOSIS — E1140 Type 2 diabetes mellitus with diabetic neuropathy, unspecified: Secondary | ICD-10-CM | POA: Diagnosis not present

## 2018-05-31 DIAGNOSIS — Z79890 Hormone replacement therapy (postmenopausal): Secondary | ICD-10-CM | POA: Diagnosis not present

## 2018-05-31 DIAGNOSIS — E1165 Type 2 diabetes mellitus with hyperglycemia: Secondary | ICD-10-CM

## 2018-05-31 DIAGNOSIS — N184 Chronic kidney disease, stage 4 (severe): Secondary | ICD-10-CM

## 2018-05-31 DIAGNOSIS — Z89411 Acquired absence of right great toe: Secondary | ICD-10-CM | POA: Insufficient documentation

## 2018-05-31 DIAGNOSIS — I2510 Atherosclerotic heart disease of native coronary artery without angina pectoris: Secondary | ICD-10-CM | POA: Diagnosis not present

## 2018-05-31 DIAGNOSIS — Z6841 Body Mass Index (BMI) 40.0 and over, adult: Secondary | ICD-10-CM | POA: Diagnosis not present

## 2018-05-31 DIAGNOSIS — I739 Peripheral vascular disease, unspecified: Secondary | ICD-10-CM | POA: Diagnosis not present

## 2018-05-31 DIAGNOSIS — I872 Venous insufficiency (chronic) (peripheral): Secondary | ICD-10-CM | POA: Diagnosis not present

## 2018-05-31 DIAGNOSIS — F419 Anxiety disorder, unspecified: Secondary | ICD-10-CM | POA: Diagnosis not present

## 2018-05-31 DIAGNOSIS — Z79899 Other long term (current) drug therapy: Secondary | ICD-10-CM | POA: Insufficient documentation

## 2018-05-31 DIAGNOSIS — I10 Essential (primary) hypertension: Secondary | ICD-10-CM | POA: Diagnosis not present

## 2018-05-31 DIAGNOSIS — Z8673 Personal history of transient ischemic attack (TIA), and cerebral infarction without residual deficits: Secondary | ICD-10-CM | POA: Insufficient documentation

## 2018-05-31 DIAGNOSIS — E785 Hyperlipidemia, unspecified: Secondary | ICD-10-CM | POA: Diagnosis not present

## 2018-05-31 DIAGNOSIS — N183 Chronic kidney disease, stage 3 (moderate): Secondary | ICD-10-CM | POA: Diagnosis not present

## 2018-05-31 DIAGNOSIS — Z794 Long term (current) use of insulin: Secondary | ICD-10-CM | POA: Diagnosis not present

## 2018-05-31 DIAGNOSIS — Z9641 Presence of insulin pump (external) (internal): Secondary | ICD-10-CM | POA: Diagnosis not present

## 2018-05-31 DIAGNOSIS — I252 Old myocardial infarction: Secondary | ICD-10-CM | POA: Diagnosis not present

## 2018-05-31 DIAGNOSIS — E1122 Type 2 diabetes mellitus with diabetic chronic kidney disease: Secondary | ICD-10-CM | POA: Diagnosis not present

## 2018-05-31 DIAGNOSIS — I428 Other cardiomyopathies: Secondary | ICD-10-CM | POA: Insufficient documentation

## 2018-05-31 DIAGNOSIS — I5032 Chronic diastolic (congestive) heart failure: Secondary | ICD-10-CM | POA: Diagnosis not present

## 2018-05-31 DIAGNOSIS — F329 Major depressive disorder, single episode, unspecified: Secondary | ICD-10-CM | POA: Insufficient documentation

## 2018-05-31 DIAGNOSIS — L03115 Cellulitis of right lower limb: Secondary | ICD-10-CM | POA: Insufficient documentation

## 2018-05-31 DIAGNOSIS — Z7982 Long term (current) use of aspirin: Secondary | ICD-10-CM | POA: Diagnosis not present

## 2018-05-31 DIAGNOSIS — E039 Hypothyroidism, unspecified: Secondary | ICD-10-CM | POA: Diagnosis not present

## 2018-05-31 DIAGNOSIS — E669 Obesity, unspecified: Secondary | ICD-10-CM | POA: Diagnosis not present

## 2018-05-31 LAB — BASIC METABOLIC PANEL
Anion gap: 10 (ref 5–15)
BUN: 39 mg/dL — ABNORMAL HIGH (ref 8–23)
CO2: 24 mmol/L (ref 22–32)
Calcium: 9.4 mg/dL (ref 8.9–10.3)
Chloride: 104 mmol/L (ref 98–111)
Creatinine, Ser: 1.85 mg/dL — ABNORMAL HIGH (ref 0.44–1.00)
GFR calc Af Amer: 32 mL/min — ABNORMAL LOW (ref >60)
GFR calc non Af Amer: 27 mL/min — ABNORMAL LOW (ref >60)
Glucose, Bld: 128 mg/dL — ABNORMAL HIGH (ref 70–99)
Potassium: 4.3 mmol/L (ref 3.5–5.1)
Sodium: 138 mmol/L (ref 135–145)

## 2018-05-31 NOTE — Patient Instructions (Addendum)
Home Health Nurse to monitor blood pressure. Call if systolic blood pressure (top number) is greater than 140.  Labs today We will only contact you if something comes back abnormal or we need to make some changes. Otherwise no news is good news!  Do the following things EVERYDAY: 1) Weigh yourself in the morning before breakfast. Write it down and keep it in a log. 2) Take your medicines as prescribed 3) Eat low salt foods-Limit salt (sodium) to 2000 mg per day.  4) Stay as active as you can everyday 5) Limit all fluids for the day to less than 2 liters  Your physician recommends that you schedule a follow-up appointment in: 3 months in NP/PA clinic

## 2018-05-31 NOTE — Progress Notes (Signed)
Office Visit Note   Patient: Susan [name]           Date of Birth: [birth year]           MRN: [id number] Visit Date: 05/27/2018              Requested by: Alycia Rossetti, MD [address] [place], [place] [address] PCP: Alycia Rossetti, MD  Chief Complaint  Patient presents with  . Right Foot - Routine Post Op      HPI: Patient is a 68 year old woman who presents 4 weeks status post right transmetatarsal amputation.  She states she does have some edema the stitches are intact she feels like it is healing well.  Assessment & Plan: Visit Diagnoses:  1. History of transmetatarsal amputation of right foot ([place])     Plan: Sutures are harvested.  Patient had a prescription for [employer] for her shoes inserts, double upright brace, carbon plate and spacer.  Patient states that the insurance would not pay for the orthotics.  I discussed she should follow-up with the insurance company that the orthotics carbon plate spacer and double upright brace are necessary for safe ambulation.  Follow-Up Instructions: Return in about 4 weeks (around 06/24/2018).   Ortho Exam  Patient is alert, oriented, no adenopathy, well-dressed, normal affect, normal respiratory effort. Examination patient incision is well-healed we will harvest the sutures.  Patient will increase her weightbearing as tolerated.  Imaging: No results found. No images are attached to the encounter.  Labs: Lab Results  Component Value Date   HGBA1C 8.3 (H) 04/30/2018   HGBA1C 9.3 (H) 09/25/2017   HGBA1C 11.4 (H) 07/16/2017   CRP 8.4 (H) 03/05/2018   LABURIC 9.0 (H) 03/06/2018   REPTSTATUS 03/21/2018 FINAL 03/18/2018   CULT (A) 03/18/2018    20,000 COLONIES/mL KLEBSIELLA PNEUMONIAE 20,000 COLONIES/mL ENTEROCOCCUS FAECALIS    LABORGA KLEBSIELLA PNEUMONIAE (A) 03/18/2018   LABORGA ENTEROCOCCUS FAECALIS (A) 03/18/2018     Lab Results  Component Value Date   ALBUMIN 3.0 (L) 03/19/2018   ALBUMIN 3.8 03/18/2018     ALBUMIN 2.6 (L) 02/14/2018   LABURIC 9.0 (H) 03/06/2018    Body mass index is 45.76 kg/m.  Orders:  No orders of the defined types were placed in this encounter.  No orders of the defined types were placed in this encounter.    Procedures: No procedures performed  Clinical Data: No additional findings.  ROS:  All other systems negative, except as noted in the HPI. Review of Systems  Objective: Vital Signs: Ht 5\' 5"  (1.651 m)   Wt 275 lb (124.7 kg)   BMI 45.76 kg/m   Specialty Comments:  No specialty comments available.  PMFS History: Patient Active Problem List   Diagnosis Date Noted  . Pressure injury of skin 03/19/2018  . Syncope 03/18/2018  . Cellulitis of hand, left 03/06/2018  . Cellulitis 03/06/2018  . Amputated great toe, right ([place]) 02/24/2018  . Osteomyelitis of second toe of right foot ([place])   . Toe osteomyelitis, right ([place])   . 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 ([place])   . Acute on chronic diastolic (congestive) heart failure ([place]) 02/13/2018  . PVD (peripheral vascular disease) ([place]) 10/26/2017  . Hypothyroid 07/27/2017  . PAH (pulmonary artery hypertension) ([place])   . Impaired ambulation 07/19/2017  . Hyperosmolar non-ketotic state in patient with type 2 diabetes mellitus ([place]) 07/15/2017  . Leg cramps 02/27/2017  . Peripheral edema 01/12/2017  .  Diabetic neuropathy ([place]) 11/12/2016  . 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 mellitus ([place]) 10/24/2015  . Anemia 10/03/2015  . Generalized anxiety disorder 10/03/2015  . Insomnia 10/03/2015  . Diabetes mellitus type II, uncontrolled ([place]) 06/07/2015  . Chronic diastolic CHF (congestive heart failure) ([place]) 06/07/2015  . Non compliance with medical treatment 04/17/2014  . Rotator cuff tear 03/14/2014  . Morbid obesity ([place]) 09/23/2013  . Hypotension 12/25/2012  . Urinary incontinence   . MDD (major depressive disorder) 11/12/2010  . RBBB (right bundle branch block)   . CAD  (coronary artery disease)   . Hyperlipemia 01/22/2009  . Essential hypertension 01/22/2009   Past Medical History:  Diagnosis Date  . Acute MI ([place]) 1999; 2007  . Anemia    hx  . Anginal pain ([place])   . Anxiety   . ARF (acute renal failure) ([place]) 06/2017   [facility]  . Arthritis    "generalized" (03/15/2014)  . CAD (coronary artery disease)    MI in 2000 - MI  2007 - treated bare metal stent (no nuclear since then as 9/11)  . Carotid artery disease ([place])   . CHF (congestive heart failure) ([place])   . Chronic diastolic heart failure (HCC)    a) ECHO (08/2013) EF 55-60% and RV function nl b) RHC (08/2013) RA 4, RV 30/5/7, PA 25/10 (16), PCWP 7, Fick CO/CI 6.3/2.7, PVR 1.5 WU, PA 61 and 66%  . Daily headache    "~ every other day; since I fell in June" (03/15/2014)  . Depression   . Dyslipidemia   . Dyspnea   . Exertional shortness of breath   . HTN (hypertension)   . Hypothyroidism   . Neuropathy   . Obesity   . Osteoarthritis   . Peripheral neuropathy   . PONV (postoperative nausea and vomiting)   . RBBB (right bundle branch block)    Old  . Stroke [facility])    mini strokes  . Syncope    likely due to low blood sugar  . Tachycardia    Sinus tachycardia  . Type II diabetes mellitus (HCC)    Type II  . Urinary incontinence   . Venous insufficiency     Family History  Problem Relation Age of Onset  . Heart attack Mother 104    Past Surgical History:  Procedure Laterality Date  . ABDOMINAL HYSTERECTOMY  1980's  . AMPUTATION Right 02/24/2018   Procedure: RIGHT FOOT GREAT TOE AND 2ND TOE AMPUTATION;  Surgeon: Newt Minion, MD;  Location: [place] [facility];  Service: Orthopedics;  Laterality: Right;  . AMPUTATION Right 04/30/2018   Procedure: RIGHT TRANSMETATARSAL AMPUTATION;  Surgeon: Newt Minion, MD;  Location: [place];  Service: Orthopedics;  Laterality: Right;  . CATARACT EXTRACTION, BILATERAL Bilateral ?2013  . COLONOSCOPY W/ POLYPECTOMY    . CORONARY ANGIOPLASTY WITH  STENT PLACEMENT  1999; 2007   "1 + 1"  . EYE SURGERY Bilateral    [place]  . KNEE ARTHROSCOPY Left 10/25/2006  . RIGHT HEART CATH N/A 07/24/2017   Procedure: RIGHT HEART CATH;  Surgeon: Jolaine Artist, MD;  Location: [place] CV LAB;  Service: Cardiovascular;  Laterality: N/A;  . RIGHT HEART CATHETERIZATION N/A 09/22/2013   Procedure: RIGHT HEART CATH;  Surgeon: Jolaine Artist, MD;  [facility] C[facility];  Service: Cardiovascular;  Laterality: N/A;  . SHOULDER ARTHROSCOPY WITH OPEN ROTATOR CUFF REPAIR Right 03/14/2014   Procedure: RIGHT SHOULDER ARTHROSCOPY WITH BICEPS RELEASE, OPEN SUBSCAPULA REPAIR,  OPEN SUPRASPINATUS REPAIR.;  Surgeon: Meredith Pel, MD;  Location: [place] [facility];  Service: Orthopedics;  Laterality: Right;  . TOE AMPUTATION Right 02/24/2018   GREAT TOE AND 2ND TOE AMPUTATION  . TUBAL LIGATION  1970's   Social History   Occupational History    Employer: UNEMPLOYED  Tobacco Use  . Smoking status: Former Smoker    Packs/day: 3.00    Years: 32.00    Pack years: 96.00    Types: Cigarettes    Last attempt to quit: 10/24/1997    Years since quitting: 20.6  . Smokeless tobacco: Never Used  Substance and Sexual Activity  . Alcohol use: Not Currently    Comment: "might have 2-3 daiquiris in the summer"  . Drug use: No  . Sexual activity: Never

## 2018-06-02 ENCOUNTER — Other Ambulatory Visit: Payer: Self-pay | Admitting: Licensed Clinical Social Worker

## 2018-06-02 ENCOUNTER — Other Ambulatory Visit: Payer: Self-pay

## 2018-06-02 NOTE — Patient Outreach (Signed)
[facility]) Care Management  06/02/2018  Susan [name] 06-30-1950 [id number]    3rd attempt to outreach the patient for initial assessment.  Line busy.  Plan:  RN Health Coach will attempt to reach the patient later this afternoon.  [name] RN, BSN, [place] Direct Dial:  [phone]  Fax: [phone]

## 2018-06-02 NOTE — Patient Outreach (Signed)
Assessment:  CSW spoke via phone with client. CSW verified client identity CSW received verbal permission from client for CSW to speak with client about client needs.  Client sees Dr. Sharol Given as scheduled. Client sees Dr. [name] as scheduled. Client has home health nursing support as scheduled with [facility]. CSW has talked with client in recent months about client care plan and client use of relaxation techniques to help client manage anxiety and depression symptoms. CSW and Susan [name] have talked about relaxation techniques she uses to help her manage symptoms faced. CSW has also talked with client about various community resources of assistance to client. Client has some support from her spouse.  She does have mobility challenges related to a recent surgery on her foot.  Client is receiving [facility] pharmacy support and support with [facility].  CSW informed Susan [name] on 06/02/18 that CSW would transfer CSW care and support for client to [employer], Bachelor of Social Work, on 06/02/18. Susan [name] agreed to this plan. Susan [name] agreed for [employer] to call Susan [name] in 4 weeks to assess client needs at that time.  CSW thanked Susan [name] for communicating with CSW in recent months to address needs of client. Client was appreciative of help she has received from Susan [name] in recent months.    Plan:  CSW is transferring CSW care and support for Susan [name] to [employer], Bachelor of Social Work, on 06/02/18. Client agreed to this plan.  Geographical information systems officer, Bachelor of Social Work, to call client in 4 weeks to assess client's social work needs at that time.  Susan [name].Susan [name] MSW, LCSW Licensed Clinical Social Worker [facility] Care Management [phone]

## 2018-06-02 NOTE — Patient Outreach (Signed)
[facility]) Care Management  06/02/2018  Susan [name] 09-Sep-1950 [id number]    Unsuccessful outreach to the patient for initial assessment.  No answer.  HIPAA compliant voicemail left with contact information.  Plan: RN Health Coach will make outreach attempt the patient within thirty business days.   [name] RN, BSN, [place] Direct Dial:  [phone]  Fax: [phone]

## 2018-06-06 ENCOUNTER — Other Ambulatory Visit (HOSPITAL_COMMUNITY): Payer: Self-pay | Admitting: Internal Medicine

## 2018-06-07 ENCOUNTER — Other Ambulatory Visit: Payer: Self-pay

## 2018-06-07 ENCOUNTER — Encounter: Payer: Self-pay | Admitting: Family Medicine

## 2018-06-07 ENCOUNTER — Telehealth (INDEPENDENT_AMBULATORY_CARE_PROVIDER_SITE_OTHER): Payer: Self-pay | Admitting: Orthopedic Surgery

## 2018-06-07 ENCOUNTER — Ambulatory Visit (INDEPENDENT_AMBULATORY_CARE_PROVIDER_SITE_OTHER): Payer: PPO | Admitting: Family Medicine

## 2018-06-07 VITALS — BP 130/68 | HR 62 | Temp 97.8°F | Resp 14 | Ht 65.0 in | Wt 254.0 lb

## 2018-06-07 DIAGNOSIS — E1122 Type 2 diabetes mellitus with diabetic chronic kidney disease: Secondary | ICD-10-CM | POA: Diagnosis not present

## 2018-06-07 DIAGNOSIS — I10 Essential (primary) hypertension: Secondary | ICD-10-CM | POA: Diagnosis not present

## 2018-06-07 DIAGNOSIS — R262 Difficulty in walking, not elsewhere classified: Secondary | ICD-10-CM

## 2018-06-07 DIAGNOSIS — E039 Hypothyroidism, unspecified: Secondary | ICD-10-CM | POA: Diagnosis not present

## 2018-06-07 DIAGNOSIS — N184 Chronic kidney disease, stage 4 (severe): Secondary | ICD-10-CM

## 2018-06-07 DIAGNOSIS — E1165 Type 2 diabetes mellitus with hyperglycemia: Secondary | ICD-10-CM

## 2018-06-07 DIAGNOSIS — E1142 Type 2 diabetes mellitus with diabetic polyneuropathy: Secondary | ICD-10-CM | POA: Diagnosis not present

## 2018-06-07 DIAGNOSIS — I5032 Chronic diastolic (congestive) heart failure: Secondary | ICD-10-CM | POA: Diagnosis not present

## 2018-06-07 MED ORDER — TORSEMIDE 20 MG PO TABS: ORAL_TABLET | ORAL | 3 refills | Status: DC

## 2018-06-07 MED ORDER — FERROUS SULFATE 325 (65 FE) MG PO TABS: 325.0000 mg | ORAL_TABLET | Freq: Two times a day (BID) | ORAL | 2 refills | Status: DC

## 2018-06-07 NOTE — Patient Instructions (Addendum)
Call if therapist doesn't respond  Continue your current medication Your A1C is  8.3% which is much better! Weight down 6lbs  F/U 4 months

## 2018-06-07 NOTE — Telephone Encounter (Signed)
Refer to notes.

## 2018-06-07 NOTE — Telephone Encounter (Signed)
I called patient and left her a message to give [name] a call if another Rx is needed for [employer]. I tried both numbers listed. She can pick-up Rx at front desk.

## 2018-06-07 NOTE — Progress Notes (Signed)
Subjective:    Patient ID: Susan [name], female    DOB: [birth year], 68 y.o.   MRN: [id number]  Patient presents for Follow-up (S/P toe amputation on R foot)  Pt here to f/u chronic medical problems.  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 She is still in a boot. [employer] is the company she uses, she needs a bar placed in her shoe- Dr. Sharol Given will do this for her.  she is not needing pain medicaiton daily, but has it on hand  DM- she is running out of her lyrica, she was on a medication program with [employer] awaiting approval   She is currently on an insulin pump  Last A1C 8.3% in December   Depression and anxiety- continues to have stressors dealing with adult son, states therapist called her and she put it off, she is suppose to call later this week to reschedule appointment. Continued on lexapro, feels it works most days,   Peripheral- taking 60mg  BID of demadex, this was increaed by [place].  Anemia of chronic disease- she is taking iron tablet twice a day   She had eye surgery- Dr. Baird Cancer, she had shots in her right eye this is was her 3rd treatment, then she has blurry vision but that resolved, but also had subconjunctival hemorrhage    Review Of Systems:  GEN- denies fatigue, fever, weight loss,weakness, recent illness HEENT- denies eye drainage, change in vision, nasal discharge, CVS- denies chest pain, palpitations RESP- denies SOB, cough, wheeze ABD- denies N/V, change in stools, abd pain GU- denies dysuria, hematuria, dribbling, incontinence MSK- + joint pain, muscle aches, injury Neuro- denies headache, dizziness, syncope, seizure activity       Objective:    BP 130/68   Pulse 62   Temp 97.8 F (36.6 C) (Oral)   Resp 14   Ht 5\' 5"  (1.651 m)   Wt 254 lb (115.2 kg)   SpO2 100%   BMI 42.27 kg/m  GEN- NAD, alert and oriented x3 HEENT- PERRL, EOMI, non injected sclera, pink conjunctiva, MMM, oropharynx  clear, Right subconjunctival hemorrhage, small in lateral corner Neck- Supple, no thyromegaly CVS- RRR, no murmur RESP-CTAB ABD-NABS,soft,NT,ND Psych- typical flat like affect, not anxious, no SI EXT- chronic venous stasis changes, partial right foot amputation, no lesiosn on left foot  Pulses- Radial 2+, DP- diminished Left foot         Assessment & Plan:      Problem List Items Addressed This Visit      Unprioritized   Chronic diastolic CHF (congestive heart failure) (HCC) - Primary (Chronic)    Currently compensated, continue diuretic, follow renal function      Relevant Medications   torsemide (DEMADEX) 20 MG tablet   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 mellitus (HCC)    Followed by nephrology      Diabetes mellitus type II, uncontrolled (HCC)    Improved, now using insulin pump, per endocrinology      Diabetic neuropathy ([place])   Essential hypertension    Controlled no changes       Relevant Medications   torsemide (DEMADEX) 20 MG tablet   Hypothyroid    Recheck TFT      Impaired ambulation   Morbid obesity ([place]) (Chronic)    Intentional weight loss, trying to change her diet  She has been following up with all her appointments  Note: This dictation was prepared with Dragon dictation along with smaller phrase technology. Any transcriptional errors that result from this process are unintentional.

## 2018-06-07 NOTE — Telephone Encounter (Signed)
Patient called and stated that you needed a bar(for shoe) so she can stand up stabilized.  Wanted to fax a RX to [employer] to have it done. Inserts are in shoe but patient still can't keep balance.  Patient wants it done asap because she has a ride and appt @ 2:00. Wanted to go by [employer] while she is out.  Please call patient asap. [phone]

## 2018-06-08 ENCOUNTER — Encounter: Payer: Self-pay | Admitting: Family Medicine

## 2018-06-08 NOTE — Assessment & Plan Note (Signed)
Controlled no changes 

## 2018-06-08 NOTE — Assessment & Plan Note (Signed)
Currently compensated, continue diuretic, follow renal function

## 2018-06-08 NOTE — Assessment & Plan Note (Signed)
>>  ASSESSMENT AND PLAN FOR CHRONIC DIASTOLIC CHF (CONGESTIVE HEART FAILURE) (HCC) WRITTEN ON 06/08/2018  9:33 PM BY Milinda Antis F  Currently compensated, continue diuretic, follow renal function

## 2018-06-08 NOTE — Assessment & Plan Note (Signed)
Recheck TFT 

## 2018-06-08 NOTE — Assessment & Plan Note (Signed)
Intentional weight loss, trying to change her diet  She has been following up with all her appointments

## 2018-06-08 NOTE — Assessment & Plan Note (Signed)
Improved, now using insulin pump, per endocrinology

## 2018-06-08 NOTE — Assessment & Plan Note (Signed)
Followed by nephrology. 

## 2018-06-21 ENCOUNTER — Ambulatory Visit (INDEPENDENT_AMBULATORY_CARE_PROVIDER_SITE_OTHER): Payer: PPO | Admitting: Orthopedic Surgery

## 2018-06-21 ENCOUNTER — Encounter (INDEPENDENT_AMBULATORY_CARE_PROVIDER_SITE_OTHER): Payer: Self-pay | Admitting: Orthopedic Surgery

## 2018-06-21 VITALS — Ht 65.0 in | Wt 254.0 lb

## 2018-06-21 DIAGNOSIS — B351 Tinea unguium: Secondary | ICD-10-CM | POA: Insufficient documentation

## 2018-06-21 DIAGNOSIS — Z89431 Acquired absence of right foot: Secondary | ICD-10-CM

## 2018-06-21 NOTE — Progress Notes (Signed)
Office Visit Note   Patient: Susan [name]           Date of Birth: 07-15-1950           MRN: [id number] Visit Date: 06/21/2018              Requested by: Alycia Rossetti, MD [address] [place], [place] [address] PCP: Alycia Rossetti, MD  Chief Complaint  Patient presents with  . Right Foot - Routine Post Op    04/30/18 right foot transmet amputation       HPI: Patient is a 68 year old woman who presents for 2 separate issues #1 status post right transmetatarsal amputation #2 onychomycotic nails of the left foot.  Assessment & Plan: Visit Diagnoses:  1. History of transmetatarsal amputation of right foot ([place])   2. Onychomycosis     Plan: Patient's transmetatarsal amputation is completely healed she is having trouble with balance and patient is given a prescription for a double upright brace and a spacer for the right shoe.  Nails were trimmed x5 for the left foot.  Follow-Up Instructions: Return in about 3 months (around 09/20/2018).   Ortho Exam  Patient is alert, oriented, no adenopathy, well-dressed, normal affect, normal respiratory effort. Examination patient's right foot is completely healed there is no redness no cellulitis her foot is plantigrade.  Examination the left foot she has thickened discolored onychomycotic nails x5 she is unable to safely trim the nails on her own the nails were trimmed x5 without complications.  There are no plantar ulcers no venous ulcers.  Imaging: No results found. No images are attached to the encounter.  Labs: Lab Results  Component Value Date   HGBA1C 8.3 (H) 04/30/2018   HGBA1C 9.3 (H) 09/25/2017   HGBA1C 11.4 (H) 07/16/2017   CRP 8.4 (H) 03/05/2018   LABURIC 9.0 (H) 03/06/2018   REPTSTATUS 03/21/2018 FINAL 03/18/2018   CULT (A) 03/18/2018    20,000 COLONIES/mL KLEBSIELLA PNEUMONIAE 20,000 COLONIES/mL ENTEROCOCCUS FAECALIS    LABORGA KLEBSIELLA PNEUMONIAE (A) 03/18/2018   LABORGA ENTEROCOCCUS FAECALIS (A)  03/18/2018     Lab Results  Component Value Date   ALBUMIN 3.0 (L) 03/19/2018   ALBUMIN 3.8 03/18/2018   ALBUMIN 2.6 (L) 02/14/2018   LABURIC 9.0 (H) 03/06/2018    Body mass index is 42.27 kg/m.  Orders:  No orders of the defined types were placed in this encounter.  No orders of the defined types were placed in this encounter.    Procedures: No procedures performed  Clinical Data: No additional findings.  ROS:  All other systems negative, except as noted in the HPI. Review of Systems  Objective: Vital Signs: Ht 5\' 5"  (1.651 m)   Wt 254 lb (115.2 kg)   BMI 42.27 kg/m   Specialty Comments:  No specialty comments available.  PMFS History: Patient Active Problem List   Diagnosis Date Noted  . Onychomycosis 06/21/2018  . Pressure injury of skin 03/19/2018  . Syncope 03/18/2018  . Osteomyelitis of second toe of right foot ([place])   . Toe osteomyelitis, right ([place])   . 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 ([place])   . Acute on chronic diastolic (congestive) heart failure ([place]) 02/13/2018  . PVD (peripheral vascular disease) ([place]) 10/26/2017  . Hypothyroid 07/27/2017  . PAH (pulmonary artery hypertension) ([place])   . Impaired ambulation 07/19/2017  . Hyperosmolar non-ketotic state in patient with type 2 diabetes mellitus ([place]) 07/15/2017  . Leg cramps 02/27/2017  .  Peripheral edema 01/12/2017  . Diabetic neuropathy ([place]) 11/12/2016  . 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 mellitus ([place]) 10/24/2015  . Anemia 10/03/2015  . Generalized anxiety disorder 10/03/2015  . Insomnia 10/03/2015  . Diabetes mellitus type II, uncontrolled ([place]) 06/07/2015  . Chronic diastolic CHF (congestive heart failure) ([place]) 06/07/2015  . Non compliance with medical treatment 04/17/2014  . Rotator cuff tear 03/14/2014  . Morbid obesity ([place]) 09/23/2013  . Hypotension 12/25/2012  . Urinary incontinence   . MDD (major depressive disorder) 11/12/2010  . RBBB (right  bundle branch block)   . CAD (coronary artery disease)   . Hyperlipemia 01/22/2009  . Essential hypertension 01/22/2009   Past Medical History:  Diagnosis Date  . Acute MI ([place]) 1999; 2007  . Anemia    hx  . Anginal pain ([place])   . Anxiety   . ARF (acute renal failure) ([place]) 06/2017   [facility]  . Arthritis    "generalized" (03/15/2014)  . CAD (coronary artery disease)    MI in 2000 - MI  2007 - treated bare metal stent (no nuclear since then as 9/11)  . Carotid artery disease ([place])   . CHF (congestive heart failure) ([place])   . Chronic diastolic heart failure (HCC)    a) ECHO (08/2013) EF 55-60% and RV function nl b) RHC (08/2013) RA 4, RV 30/5/7, PA 25/10 (16), PCWP 7, Fick CO/CI 6.3/2.7, PVR 1.5 WU, PA 61 and 66%  . Daily headache    "~ every other day; since I fell in June" (03/15/2014)  . Depression   . Dyslipidemia   . Dyspnea   . Exertional shortness of breath   . HTN (hypertension)   . Hypothyroidism   . Neuropathy   . Obesity   . Osteoarthritis   . Peripheral neuropathy   . PONV (postoperative nausea and vomiting)   . RBBB (right bundle branch block)    Old  . Stroke [facility])    mini strokes  . Syncope    likely due to low blood sugar  . Tachycardia    Sinus tachycardia  . Type II diabetes mellitus (HCC)    Type II  . Urinary incontinence   . Venous insufficiency     Family History  Problem Relation Age of Onset  . Heart attack Mother 26    Past Surgical History:  Procedure Laterality Date  . ABDOMINAL HYSTERECTOMY  1980's  . AMPUTATION Right 02/24/2018   Procedure: RIGHT FOOT GREAT TOE AND 2ND TOE AMPUTATION;  Surgeon: Newt Minion, MD;  Location: [facility];  Service: Orthopedics;  Laterality: Right;  . AMPUTATION Right 04/30/2018   Procedure: RIGHT TRANSMETATARSAL AMPUTATION;  Surgeon: Newt Minion, MD;  Location: [facility];  Service: Orthopedics;  Laterality: Right;  . CATARACT EXTRACTION, BILATERAL Bilateral ?2013  . COLONOSCOPY W/ POLYPECTOMY      . CORONARY ANGIOPLASTY WITH STENT PLACEMENT  1999; 2007   "1 + 1"  . EYE SURGERY Bilateral    [facility]  . KNEE ARTHROSCOPY Left 10/25/2006  . RIGHT HEART CATH N/A 07/24/2017   Procedure: RIGHT HEART CATH;  Surgeon: Jolaine Artist, MD;  Location: [place] [facility] CV LAB;  Service: Cardiovascular;  Laterality: N/A;  . RIGHT HEART CATHETERIZATION N/A 09/22/2013   Procedure: RIGHT HEART CATH;  Surgeon: Jolaine Artist, MD;  [facility]: [facility] CATH LAB;  Service: Cardiovascular;  Laterality: N/A;  . SHOULDER ARTHROSCOPY WITH OPEN ROTATOR CUFF REPAIR Right 03/14/2014   Procedure: RIGHT SHOULDER ARTHROSCOPY  WITH BICEPS RELEASE, OPEN SUBSCAPULA REPAIR, OPEN SUPRASPINATUS REPAIR.;  Surgeon: Meredith Pel, MD;  Location: [facility];  Service: Orthopedics;  Laterality: Right;  . TOE AMPUTATION Right 02/24/2018   GREAT TOE AND 2ND TOE AMPUTATION  . TUBAL LIGATION  1970's   Social History   Occupational History    Employer: UNEMPLOYED  Tobacco Use  . Smoking status: Former Smoker    Packs/day: 3.00    Years: 32.00    Pack years: 96.00    Types: Cigarettes    Last attempt to quit: 10/24/1997    Years since quitting: 20.6  . Smokeless tobacco: Never Used  Substance and Sexual Activity  . Alcohol use: Not Currently    Comment: "might have 2-3 daiquiris in the summer"  . Drug use: No  . Sexual activity: Never

## 2018-06-22 ENCOUNTER — Telehealth (INDEPENDENT_AMBULATORY_CARE_PROVIDER_SITE_OTHER): Payer: Self-pay | Admitting: Orthopedic Surgery

## 2018-06-22 NOTE — Telephone Encounter (Signed)
Patient called advised the brace that the Rx was written [employer] said she could not use it and it wouldn't work for her. Patient said [employer] is going to try make something to go on the insert of her shoe. Patient asked for a call back to discuss. Patient said she do not need the Rx that was left for her 06/07/2018. The number to contact patient is [phone]

## 2018-06-23 ENCOUNTER — Telehealth (INDEPENDENT_AMBULATORY_CARE_PROVIDER_SITE_OTHER): Payer: Self-pay

## 2018-06-23 NOTE — Telephone Encounter (Signed)
I called patient and she informed me that the upright brace was to heavy and [employer] will try something different for her shoes so that she will keep her balance.

## 2018-06-24 ENCOUNTER — Other Ambulatory Visit: Payer: Self-pay | Admitting: Pharmacist

## 2018-06-24 ENCOUNTER — Ambulatory Visit: Payer: Self-pay | Admitting: Pharmacist

## 2018-06-24 NOTE — Patient Outreach (Signed)
[facility] [facility]) Care Management [place]  06/24/2018  [name] [birth year] [id number]  Reason for referral: medication adherence/assistance  [facility] pharmacy case is being closed due to the following reasons:  Patient is no longer in need of medication assistance.  She has also demonstrated that she is well-educated in her disease states (CHF, DM especially) and "knows what to do, it's just a matter of her doing it."   Ample time spent counseling and encouraging this patient to succeed.    Reviewed medication changes with patient and updated medication list in [facility].  Provided in-depth review of diabetes & CHF medications.  She is able to obtain her Omnipod Insulin Pump system as of 05/31/2018 as it is a new calendar year (no longer in the coverage gap).  She also has a back-up subcutaneous insulin regimen provided by her Endocrinologist (Dr. [name]).  Her A1c is down to 8.3% (04/30/2018-->down from 11.4-->9.3-->8.3%).  She is well educated when it comes to her DM medications and how she should be eating.  She admits to not eating "the best" and "she knows better".  Next appointment with Dr. [name] is in March.  She has recently seen her vascular surgeon, PCP, & cardiologist this month.  [facility] Pharmacist reviewed aformentioned specialist's notes and updated medications accordingly.  Counseled patient on new diuretic regimen (torsemide 60mg  BID--> 3 tabs AM  & 3 tabs early PM) as she likes to "hold" her doses when she has appointments or outings.  Encouraged patient to continue taking as prescribed.  Her weight is down as well.  She reports that she has been compliant.  Patient has been provided [facility] CM contact information if assistance needed in the future.    Thank you for allowing [facility] pharmacy to be involved in this patient's care.     Regina Eck, PharmD, [place]  [phone]

## 2018-06-29 ENCOUNTER — Ambulatory Visit: Payer: Self-pay

## 2018-06-29 ENCOUNTER — Other Ambulatory Visit: Payer: Self-pay

## 2018-06-29 NOTE — Patient Outreach (Signed)
[facility] [facility]) Care Management  06/29/2018  Sincerity Cedar [birth year] [id number]   Patient transferred to [place] from [place], [name], on 06/02/18.   Successful outreach to Ms. Manfredi today.  She reported that she is making progress since her surgery and is "taking one day at a time".  [place] health services were completed last week.  She said that it is has been difficult having to ask people for help because she is used to being independent.  She did say that she feels fortunate to have people in her life that are willing to help when needed and that her spirits are good overall.  She said that she is just anxious to get back to being able to do more on her own.  She has been attending all MD appointments as scheduled.  No social work needs identified at this time but BSW agreed to another follow up call next month.   Ronn Melena, BSW Social Worker [phone]

## 2018-07-05 ENCOUNTER — Other Ambulatory Visit: Payer: Self-pay

## 2018-07-05 NOTE — Patient Outreach (Signed)
[place]) Care Management  07/05/2018  Susan [name] 02-04-1951 [id number]    4th unsuccessful attempt to outreach the patient for initial assessment.  No answer.  HIPAA compliant voicemail left with contact information.  Plan:  RN Health Coach will make and outreach attempt to the patient in the next sixty days.  [name] RN, BSN, [place] Direct Dial:  [phone]  Fax: [phone]

## 2018-07-12 ENCOUNTER — Other Ambulatory Visit: Payer: Self-pay | Admitting: Family Medicine

## 2018-07-14 DIAGNOSIS — N184 Chronic kidney disease, stage 4 (severe): Secondary | ICD-10-CM | POA: Diagnosis not present

## 2018-07-14 DIAGNOSIS — M109 Gout, unspecified: Secondary | ICD-10-CM | POA: Diagnosis not present

## 2018-07-14 DIAGNOSIS — I129 Hypertensive chronic kidney disease with stage 1 through stage 4 chronic kidney disease, or unspecified chronic kidney disease: Secondary | ICD-10-CM | POA: Diagnosis not present

## 2018-07-14 DIAGNOSIS — E1122 Type 2 diabetes mellitus with diabetic chronic kidney disease: Secondary | ICD-10-CM | POA: Diagnosis not present

## 2018-07-14 DIAGNOSIS — I509 Heart failure, unspecified: Secondary | ICD-10-CM | POA: Diagnosis not present

## 2018-07-14 DIAGNOSIS — D631 Anemia in chronic kidney disease: Secondary | ICD-10-CM | POA: Diagnosis not present

## 2018-07-14 DIAGNOSIS — N189 Chronic kidney disease, unspecified: Secondary | ICD-10-CM | POA: Diagnosis not present

## 2018-07-15 DIAGNOSIS — R8290 Unspecified abnormal findings in urine: Secondary | ICD-10-CM | POA: Diagnosis not present

## 2018-07-22 ENCOUNTER — Telehealth (INDEPENDENT_AMBULATORY_CARE_PROVIDER_SITE_OTHER): Payer: Self-pay | Admitting: Orthopedic Surgery

## 2018-07-22 NOTE — Telephone Encounter (Signed)
Patient would like for you to call her.  CB#[phone].  Thank you.

## 2018-07-23 ENCOUNTER — Telehealth (INDEPENDENT_AMBULATORY_CARE_PROVIDER_SITE_OTHER): Payer: Self-pay

## 2018-07-23 NOTE — Telephone Encounter (Signed)
Done

## 2018-07-23 NOTE — Telephone Encounter (Signed)
Pt stated that her toenail on left side big toe fell off so I informed her if she is not having any pain, drainage or swelling than she will be okay until her next appt. If any concerns she can always call and schedule an appt w/ Dr Sharol Given.

## 2018-07-25 ENCOUNTER — Other Ambulatory Visit: Payer: Self-pay | Admitting: Family Medicine

## 2018-07-27 ENCOUNTER — Ambulatory Visit: Payer: Self-pay

## 2018-07-27 ENCOUNTER — Other Ambulatory Visit: Payer: Self-pay

## 2018-07-27 NOTE — Patient Outreach (Signed)
[facility]) Care Management  07/27/2018  Susan [name] 08/24/1950 [id number]   Unsuccessful outreach to patient today. Left voicemail message.  Will attempt to reach again within four business days.    Ronn Melena, BSW Social Worker [phone]

## 2018-07-29 ENCOUNTER — Other Ambulatory Visit: Payer: Self-pay

## 2018-07-29 NOTE — Patient Outreach (Addendum)
[facility]) Care Management  07/29/2018  Susan [name] Jul 19, 1950 [id number]   Second unsuccessful outreach to patient today. Left voicemail message.  Will attempt to reach again within four business days.    Addendum: Return call from patient.  Patient reported that she is doing well overall and is "slowly" adjusting to life post surgery.  She missed a recent appointment due to having the wrong day on her calendar.  She reported that she is driving again so transportation is not a barrier. She mentioned that her sugar levels have been "all over the place" but acknowledged that it is due to diet and her "getting off track"  BSW informed her that [name], [place], has attempted to reach her.  BSW provided her with Traci's contact information. BSW is closing case at this time due to no current social work needs.   Ronn Melena, BSW Social Worker [phone]

## 2018-07-30 ENCOUNTER — Ambulatory Visit: Payer: Self-pay

## 2018-08-03 DIAGNOSIS — H3582 Retinal ischemia: Secondary | ICD-10-CM | POA: Diagnosis not present

## 2018-08-03 DIAGNOSIS — H43392 Other vitreous opacities, left eye: Secondary | ICD-10-CM | POA: Diagnosis not present

## 2018-08-03 DIAGNOSIS — H43813 Vitreous degeneration, bilateral: Secondary | ICD-10-CM | POA: Diagnosis not present

## 2018-08-03 DIAGNOSIS — E113513 Type 2 diabetes mellitus with proliferative diabetic retinopathy with macular edema, bilateral: Secondary | ICD-10-CM | POA: Diagnosis not present

## 2018-08-18 ENCOUNTER — Other Ambulatory Visit (HOSPITAL_COMMUNITY): Payer: Self-pay | Admitting: Cardiology

## 2018-08-19 DIAGNOSIS — I10 Essential (primary) hypertension: Secondary | ICD-10-CM | POA: Diagnosis not present

## 2018-08-19 DIAGNOSIS — E11621 Type 2 diabetes mellitus with foot ulcer: Secondary | ICD-10-CM | POA: Diagnosis not present

## 2018-08-19 DIAGNOSIS — N189 Chronic kidney disease, unspecified: Secondary | ICD-10-CM | POA: Diagnosis not present

## 2018-08-19 DIAGNOSIS — E1165 Type 2 diabetes mellitus with hyperglycemia: Secondary | ICD-10-CM | POA: Diagnosis not present

## 2018-08-19 DIAGNOSIS — E039 Hypothyroidism, unspecified: Secondary | ICD-10-CM | POA: Diagnosis not present

## 2018-08-19 DIAGNOSIS — R809 Proteinuria, unspecified: Secondary | ICD-10-CM | POA: Diagnosis not present

## 2018-08-19 DIAGNOSIS — E11319 Type 2 diabetes mellitus with unspecified diabetic retinopathy without macular edema: Secondary | ICD-10-CM | POA: Diagnosis not present

## 2018-08-19 DIAGNOSIS — G609 Hereditary and idiopathic neuropathy, unspecified: Secondary | ICD-10-CM | POA: Diagnosis not present

## 2018-08-20 ENCOUNTER — Telehealth (INDEPENDENT_AMBULATORY_CARE_PROVIDER_SITE_OTHER): Payer: Self-pay

## 2018-08-20 NOTE — Telephone Encounter (Signed)
LVM to return call for prescreen COVID-19 questions.

## 2018-08-23 ENCOUNTER — Encounter (INDEPENDENT_AMBULATORY_CARE_PROVIDER_SITE_OTHER): Payer: Self-pay | Admitting: Orthopedic Surgery

## 2018-08-23 ENCOUNTER — Ambulatory Visit (INDEPENDENT_AMBULATORY_CARE_PROVIDER_SITE_OTHER): Payer: PPO | Admitting: Orthopedic Surgery

## 2018-08-23 ENCOUNTER — Telehealth (INDEPENDENT_AMBULATORY_CARE_PROVIDER_SITE_OTHER): Payer: Self-pay | Admitting: Orthopedic Surgery

## 2018-08-23 ENCOUNTER — Other Ambulatory Visit: Payer: Self-pay

## 2018-08-23 ENCOUNTER — Ambulatory Visit (INDEPENDENT_AMBULATORY_CARE_PROVIDER_SITE_OTHER): Payer: PPO | Admitting: Physician Assistant

## 2018-08-23 VITALS — Ht 65.0 in | Wt 254.0 lb

## 2018-08-23 DIAGNOSIS — E1122 Type 2 diabetes mellitus with diabetic chronic kidney disease: Secondary | ICD-10-CM

## 2018-08-23 DIAGNOSIS — E1142 Type 2 diabetes mellitus with diabetic polyneuropathy: Secondary | ICD-10-CM

## 2018-08-23 DIAGNOSIS — L97929 Non-pressure chronic ulcer of unspecified part of left lower leg with unspecified severity: Secondary | ICD-10-CM

## 2018-08-23 DIAGNOSIS — Z794 Long term (current) use of insulin: Secondary | ICD-10-CM

## 2018-08-23 DIAGNOSIS — Z89431 Acquired absence of right foot: Secondary | ICD-10-CM

## 2018-08-23 DIAGNOSIS — B351 Tinea unguium: Secondary | ICD-10-CM | POA: Diagnosis not present

## 2018-08-23 DIAGNOSIS — N184 Chronic kidney disease, stage 4 (severe): Secondary | ICD-10-CM

## 2018-08-23 DIAGNOSIS — L97919 Non-pressure chronic ulcer of unspecified part of right lower leg with unspecified severity: Secondary | ICD-10-CM

## 2018-08-23 DIAGNOSIS — I87333 Chronic venous hypertension (idiopathic) with ulcer and inflammation of bilateral lower extremity: Secondary | ICD-10-CM

## 2018-08-23 NOTE — Progress Notes (Signed)
Office Visit Note   Patient: Susan [name]           Date of Birth: [birth year]           MRN: [id number] Visit Date: 08/23/2018              Requested by: Alycia Rossetti, MD [address] [place], [place] [address] PCP: Alycia Rossetti, MD  Chief Complaint  Patient presents with  . Right Foot - Follow-up    04/30/18 right transmet amputation       HPI: The patient is a 68 yo woman who is seen for post operative follow up following right transmetatarsal amputation on 05/01/2019. She reports she has been doing well and is wearing vive compression socks on bilateral feet. She has a custom orthotic and spacer in her right shoe. She does want her toenails trimmed on her left foot today as she has very onychomycotic nails and is unable to trim them safely with her history of DM with polyneuropathy.   Assessment & Plan: Visit Diagnoses:  1. History of transmetatarsal amputation of right foot ([name])   2. Onychomycosis   3. Type 2 diabetes mellitus with diabetic polyneuropathy, with long-term current use of insulin ([place])   4. 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 mellitus (HCC)     Plan:Toe nails left foot trimmed x 5 as patient unable to safely trim at home. Discussed using cyanoacrylate, super glue to the nails to help with the fungal infection once weekly x 6 months. Counseled to continue Vive compression socks bilaterally.Counseled to do Achilles stretching exercises to bilateral feet, but especially the right residual foot.  Follow up in 3 months or sooner if any difficulties in the interim.   Follow-Up Instructions: Return in about 3 months (around 11/23/2018).   Ortho Exam  Patient is alert, oriented, no adenopathy, well-dressed, normal affect, normal respiratory effort. The right foot transmetatarsal amputation site is well healed and the residual foot is without signs of  callus or ulcerations. Dorsiflexion to neutral on the right and discussed doing Achilles stretching exercises . Good pedal pulses bilaterally.  Left foot without ulceration or calluses. Onychomycotic nails x 5 trimmed without difficulty on the left.   Imaging: No results found. No images are attached to the encounter.  Labs: Lab Results  Component Value Date   HGBA1C 8.3 (H) 04/30/2018   HGBA1C 9.3 (H) 09/25/2017   HGBA1C 11.4 (H) 07/16/2017   CRP 8.4 (H) 03/05/2018   LABURIC 9.0 (H) 03/06/2018   REPTSTATUS 03/21/2018 FINAL 03/18/2018   CULT (A) 03/18/2018    20,000 COLONIES/mL KLEBSIELLA PNEUMONIAE 20,000 COLONIES/mL ENTEROCOCCUS FAECALIS    LABORGA KLEBSIELLA PNEUMONIAE (A) 03/18/2018   LABORGA ENTEROCOCCUS FAECALIS (A) 03/18/2018     Lab Results  Component Value Date   ALBUMIN 3.0 (L) 03/19/2018   ALBUMIN 3.8 03/18/2018   ALBUMIN 2.6 (L) 02/14/2018   LABURIC 9.0 (H) 03/06/2018    Body mass index is 42.27 kg/m.  Orders:  No orders of the defined types were placed in this encounter.  No orders of the defined types were placed in this encounter.    Procedures: No procedures performed  Clinical Data: No additional findings.  ROS:  All other systems negative, except as noted in the HPI. Review of Systems  Objective: Vital Signs: Ht 5\' 5"  (1.651 m)   Wt 254 lb (115.2  kg)   BMI 42.27 kg/m   Specialty Comments:  No specialty comments available.  PMFS History: Patient Active Problem List   Diagnosis Date Noted  . Onychomycosis 06/21/2018  . Pressure injury of skin 03/19/2018  . Syncope 03/18/2018  . Osteomyelitis of second toe of right foot ([place])   . Toe osteomyelitis, right ([place])   . 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 ([place])   . Acute on chronic diastolic (congestive) heart failure ([place]) 02/13/2018  . PVD (peripheral vascular disease) ([place]) 10/26/2017  . Hypothyroid 07/27/2017  . PAH (pulmonary artery hypertension) ([place])   .  Impaired ambulation 07/19/2017  . Hyperosmolar non-ketotic state in patient with type 2 diabetes mellitus ([place]) 07/15/2017  . Leg cramps 02/27/2017  . Peripheral edema 01/12/2017  . Diabetic neuropathy ([place]) 11/12/2016  . 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 mellitus ([place]) 10/24/2015  . Anemia 10/03/2015  . Generalized anxiety disorder 10/03/2015  . Insomnia 10/03/2015  . Diabetes mellitus type II, uncontrolled ([place]) 06/07/2015  . Chronic diastolic CHF (congestive heart failure) ([place]) 06/07/2015  . Non compliance with medical treatment 04/17/2014  . Rotator cuff tear 03/14/2014  . Morbid obesity ([place]) 09/23/2013  . Hypotension 12/25/2012  . Urinary incontinence   . MDD (major depressive disorder) 11/12/2010  . RBBB (right bundle branch block)   . CAD (coronary artery disease)   . Hyperlipemia 01/22/2009  . Essential hypertension 01/22/2009   Past Medical History:  Diagnosis Date  . Acute MI ([place]) 1999; 2007  . Anemia    hx  . Anginal pain ([place])   . Anxiety   . ARF (acute renal failure) ([place]) 06/2017    Kidney Asso  . Arthritis    "generalized" (03/15/2014)  . CAD (coronary artery disease)    MI in 2000 - MI  2007 - treated bare metal stent (no nuclear since then as 9/11)  . Carotid artery disease ([place])   . CHF (congestive heart failure) ([place])   . Chronic diastolic heart failure (HCC)    a) ECHO (08/2013) EF 55-60% and RV function nl b) RHC (08/2013) RA 4, RV 30/5/7, PA 25/10 (16), PCWP 7, Fick CO/CI 6.3/2.7, PVR 1.5 WU, PA 61 and 66%  . Daily headache    "~ every other day; since I fell in June" (03/15/2014)  . Depression   . Dyslipidemia   . Dyspnea   . Exertional shortness of breath   . HTN (hypertension)   . Hypothyroidism   . Neuropathy   . Obesity   . Osteoarthritis   . Peripheral neuropathy   . PONV (postoperative nausea and vomiting)   . RBBB (right bundle branch block)    Old  . Stroke [facility])    mini strokes  . Syncope    likely due to low blood  sugar  . Tachycardia    Sinus tachycardia  . Type II diabetes mellitus (HCC)    Type II  . Urinary incontinence   . Venous insufficiency     Family History  Problem Relation Age of Onset  . Heart attack Mother 76    Past Surgical History:  Procedure Laterality Date  . ABDOMINAL HYSTERECTOMY  1980's  . AMPUTATION Right 02/24/2018   Procedure: RIGHT FOOT GREAT TOE AND 2ND TOE AMPUTATION;  Surgeon: Newt Minion, MD;  Location: [facility];  Service: Orthopedics;  Laterality: Right;  . AMPUTATION Right 04/30/2018   Procedure: RIGHT TRANSMETATARSAL AMPUTATION;  Surgeon: Newt Minion, MD;  Location: [facility]  OR;  Service: Orthopedics;  Laterality: Right;  . CATARACT EXTRACTION, BILATERAL Bilateral ?2013  . COLONOSCOPY W/ POLYPECTOMY    . CORONARY ANGIOPLASTY WITH STENT PLACEMENT  1999; 2007   "1 + 1"  . EYE SURGERY Bilateral    [place]  . KNEE ARTHROSCOPY Left 10/25/2006  . RIGHT HEART CATH N/A 07/24/2017   Procedure: RIGHT HEART CATH;  Surgeon: Jolaine Artist, MD;  Location: [facility] CV LAB;  Service: Cardiovascular;  Laterality: N/A;  . RIGHT HEART CATHETERIZATION N/A 09/22/2013   Procedure: RIGHT HEART CATH;  Surgeon: Jolaine Artist, MD;  Location: [facility] CATH LAB;  Service: Cardiovascular;  Laterality: N/A;  . SHOULDER ARTHROSCOPY WITH OPEN ROTATOR CUFF REPAIR Right 03/14/2014   Procedure: RIGHT SHOULDER ARTHROSCOPY WITH BICEPS RELEASE, OPEN SUBSCAPULA REPAIR, OPEN SUPRASPINATUS REPAIR.;  Surgeon: Meredith Pel, MD;  Location: [facility];  Service: Orthopedics;  Laterality: Right;  . TOE AMPUTATION Right 02/24/2018   GREAT TOE AND 2ND TOE AMPUTATION  . TUBAL LIGATION  1970's   Social History   Occupational History    Employer: UNEMPLOYED  Tobacco Use  . Smoking status: Former Smoker    Packs/day: 3.00    Years: 32.00    Pack years: 96.00    Types: Cigarettes    Last attempt to quit: 10/24/1997    Years since quitting: 20.8  . Smokeless tobacco: Never Used  Substance and Sexual  Activity  . Alcohol use: Not Currently    Comment: "might have 2-3 daiquiris in the summer"  . Drug use: No  . Sexual activity: Never

## 2018-08-23 NOTE — Telephone Encounter (Signed)
Returned call to patient left message to return call to confirm appt

## 2018-08-30 ENCOUNTER — Encounter (HOSPITAL_COMMUNITY): Payer: PPO

## 2018-09-08 ENCOUNTER — Other Ambulatory Visit: Payer: Self-pay

## 2018-09-08 NOTE — Patient Outreach (Signed)
[facility]) Care Management  09/08/2018   Susan [name] 06-12-1950 [id number]   Outreach # 5 to the patient for initial assessment.  Patient was able to verify HIPAA. She was able to complete the assessment.  Social: She lives in the home with her husband and son part time.  She states that she does not have a support system.  She states that she is independent/assist with her ADLS/IADLS.  She states that she has transportation to her appointments.  The patient states that she has pain in her knees and her right foot.  She rates the pain at an 8/10.  She states that she has medication and she tries to do foot exercises.  She stats that she has not had a fall that she has hit the ground but has lost her balance and caught herself.  Discussed fall precautions with her.  She verbalized understanding.  The durable medical equipment in the home includes: Walker, cane, one touch glucometer and Omnipod insulin pump.   Conditions: Per chart review and talking with the patient her conditions include: CAD, CHF, HTN, Hypotension, DM Type II, CKD stage 4,  Hypothyroid, Neuropathy, Morbid obesity, Hyperlipidemia, Anxiety and Anemia.  The patient states that she knows that the food that she eats is not good for her and she is drinking tea with sugar and pepsi.  Her last a1c in march was 13.0 that went up from 8.3 in December.  She states that during the holidays she got off track and wants to get back eating the right foods.  Discussed healthier food and drink choices.  She verbalized understanding.  She states that her FBS this morning was 238.  She uses insulin and has an Omnipod insulin pump to give her medications.  She states that she gets confused with the dosages and feels that she is not giving herself the right amount. Notified the patient that I am putting in a referral to pharmacy to help her.  The patient states that she is depressed and cries all the time because she is unhappy that she  is unable to do what she use to and having to depend on people.  She states that she has no support in the home. Made referral to social work for help with depression.  Medications: Per chart review the patient is on eighteen medications.  She states that she manages her medications.  She did not express any problems paying for her meds.  Appointments:  The patient states that she has an appointment with her PCP in May 11th, sometime in April her Cardiologist and Kidney doctor on the 22nd.   Advance Directives: The patient states that she does not have an advanced directive and would like for me to send information.  Current Medications:  Current Outpatient Medications  Medication Sig Dispense Refill  . aspirin EC 81 MG tablet Take 81 mg by mouth daily.    . carvedilol (COREG) 12.5 MG tablet TAKE 1 TABLET BY MOUTH TWICE A DAY WITH A MEAL 90 tablet 1  . cholecalciferol (VITAMIN D) 1000 units tablet Take 1,000 Units by mouth daily.    . clopidogrel (PLAVIX) 75 MG tablet TAKE 1 TABLET BY MOUTH DAILY WITH BREAKFAST. 90 tablet 2  . escitalopram (LEXAPRO) 20 MG tablet TAKE 1 TABLET BY MOUTH EVERY DAY 90 tablet 2  . ferrous sulfate 325 (65 FE) MG tablet Take 1 tablet (325 mg total) by mouth 2 (two) times daily with a meal. Reported on 08/21/2015  60 tablet 2  . Insulin Human (INSULIN PUMP) SOLN Inject into the skin.    Marland Kitchen insulin NPH Human (HUMULIN N,NOVOLIN N) 100 UNIT/ML injection Inject 10 Units into the skin daily as needed (IN THE MORNIING FOR BLOOD SUGARS OVER 200 IN THE MORNING). Using insulin in Omnipod insulin pump system    . insulin regular (NOVOLIN R,HUMULIN R) 100 units/mL injection Inject 5-15 Units into the skin See admin instructions. With OMNIPOD Sliding scale 80-150 5 units, 150-200 7 units, 201-250 10 units, 251-300 12 units, 301-400 15 units.    Marland Kitchen levothyroxine (SYNTHROID, LEVOTHROID) 50 MCG tablet Take 1 tablet (50 mcg total) by mouth daily before breakfast. 90 tablet 1  . Magnesium  Oxide 400 (240 Mg) MG TABS TAKE 1 TABLET BY MOUTH EVERY DAY (Patient taking differently: Take 400 mg by mouth daily. ) 90 tablet 3  . nitroGLYCERIN (NITROSTAT) 0.4 MG SL tablet PLACE 1 TABLET (0.4 MG TOTAL) UNDER THE TONGUE EVERY 5 (FIVE) MINUTES AS NEEDED FOR CHEST PAIN. 25 tablet 1  . nystatin (MYCOSTATIN/NYSTOP) powder Apply topically 4 (four) times daily. (Patient taking differently: Apply 1 g topically 4 (four) times daily as needed (for yeast). ) 60 g 3  . nystatin cream (MYCOSTATIN) Apply 1 application topically 2 (two) times daily. (Patient taking differently: Apply 1 application topically 2 (two) times daily as needed (yeast/irritation.). ) 60 g 2  . ONETOUCH VERIO test strip     . pregabalin (LYRICA) 150 MG capsule Take 1 capsule (150 mg total) by mouth 2 (two) times daily. 60 capsule 2  . rosuvastatin (CRESTOR) 20 MG tablet TAKE 1 TABLET BY MOUTH EVERY DAY (Patient taking differently: Take 20 mg by mouth. ) 90 tablet 3  . torsemide (DEMADEX) 20 MG tablet Take 3 tablets (60 mg total) by mouth every morning AND 3 tablets (60 mg total) every evening. 540 tablet 3  . vitamin E (VITAMIN E) 1000 UNIT capsule Take 1,000 Units by mouth daily.    Marland Kitchen KLOR-CON M10 10 MEQ tablet TAKE 1 TABLET BY MOUTH EVERY MORNING WITH WATER PILL (Patient not taking: Reported on 09/08/2018) 90 tablet 2  . oxyCODONE-acetaminophen (PERCOCET/ROXICET) 5-325 MG tablet Take 1 tablet by mouth every 4 (four) hours as needed. (Patient not taking: Reported on 09/08/2018) 30 tablet 0   No current facility-administered medications for this visit.     Functional Status:  In your present state of health, do you have any difficulty performing the following activities: 04/30/2018 03/11/2018  Hearing? N -  [place]? Y -  Comment - -  Difficulty concentrating or making decisions? Y -  Walking or climbing stairs? Y -  Dressing or bathing? N -  Comment - -  Doing errands, shopping? - N  Some recent data might be hidden     Fall/Depression Screening: Fall Risk  04/13/2018 01/13/2018 09/25/2017  Falls in the past year? 1 No Yes  Comment - - -  Number falls in past yr: 1 - 2 or more  Injury with Fall? 0 - No  Comment - - -  Risk Factor Category  - - -  Risk for fall due to : History of fall(s);Impaired balance/gait;Impaired mobility - -  Risk for fall due to: Comment - - -  Follow up Falls evaluation completed - -   PHQ 2/9 Scores 09/08/2018 04/13/2018 03/02/2018 01/13/2018 09/25/2017 08/25/2017 07/27/2017  PHQ - 2 Score 6 6 1  0 0 1 4  PHQ- 9 Score 20 21 - - - -  13    Assessment: Patient will benefit from health coach outreach for disease management and support.  [name] CM Care Plan Problem One     Most Recent Value  Care Plan Problem One  Knowledge deficit related to diabetes as evidenced by A1c of 13  Role Documenting the Problem One  [place] for Problem One  Active  [name] Long Term Goal   In 30 days the patient will verbalize recieving and reading education information  [name] Long Term Goal Start Date  09/08/18  Interventions for Problem One Long Term Goal  Discussed with patient low carbohydrate diet, discussed healthier meal and drink options, encouraged continued  exercises, reviewed medications and encouraged medication compliance, discussed current increase in A1C and ways to help reduce it to goal of 7, reviewed and discussed hypoglycemia     Plan: [place] will provide ongoing education for patient on diabetes through phone calls and sending printed information to patient for further discussion.  RN Health Coach will send printed information on diabetes.  RN Health Coach will send initial barriers letter, assessment, and care plan to primary care physician. RN Health Coach will contact patient in the month of May and patient agrees to next outreach. Referral sent to pharmacy for medication management and social work for help with depression.  [name] RN, BSN, [place] Direct Dial:  [phone]  Fax: [phone]

## 2018-09-09 ENCOUNTER — Telehealth: Payer: Self-pay | Admitting: Pharmacist

## 2018-09-09 NOTE — Patient Outreach (Signed)
[facility]) Care Management  09/09/2018  Susan [name] 10-Jan-1951 [id number]   Patient was called per referral about Omnipod confusion. Unfortunately, she did not answer either number listed in her chart. HIPAA compliant message was left on the patient's voicemails.   Plan: Call patient back in 3-7 business days. Send unsuccessful outreach letter.  [name], PharmD, [place] Clinical Pharmacist [phone]

## 2018-09-10 ENCOUNTER — Other Ambulatory Visit: Payer: Self-pay | Admitting: *Deleted

## 2018-09-13 NOTE — Patient Outreach (Signed)
[facility]) Care Management  09/13/2018  Susan [name] 07-09-1950 [id number]   Late Entry  CSW attempted to reach pt again by phone and was unsuccessful. A HIPPA compliant voice message was left. CSW will attempt again within the 3 calls in 10 business days outreach attempt policy.   Eduard Clos, MSW, [place] Worker  [place] [phone]

## 2018-09-14 ENCOUNTER — Other Ambulatory Visit: Payer: Self-pay | Admitting: *Deleted

## 2018-09-14 NOTE — Patient Outreach (Signed)
[facility] [facility]) Care Management  09/14/2018  Susan [name] Jun 13, 1950 [id number]   CSW was unable to reach pt again today. A HIPPA compliant voice message was left and will await callback or attempt outreach again per 3 outreach attempts in 10 business days policy.   Eduard Clos, MSW, [place] Worker  [place] [phone]

## 2018-09-15 ENCOUNTER — Other Ambulatory Visit: Payer: Self-pay | Admitting: Pharmacist

## 2018-09-15 ENCOUNTER — Ambulatory Visit: Payer: Self-pay | Admitting: Pharmacist

## 2018-09-15 DIAGNOSIS — I129 Hypertensive chronic kidney disease with stage 1 through stage 4 chronic kidney disease, or unspecified chronic kidney disease: Secondary | ICD-10-CM | POA: Diagnosis not present

## 2018-09-15 DIAGNOSIS — E1122 Type 2 diabetes mellitus with diabetic chronic kidney disease: Secondary | ICD-10-CM | POA: Diagnosis not present

## 2018-09-15 DIAGNOSIS — N189 Chronic kidney disease, unspecified: Secondary | ICD-10-CM | POA: Diagnosis not present

## 2018-09-15 DIAGNOSIS — N184 Chronic kidney disease, stage 4 (severe): Secondary | ICD-10-CM | POA: Diagnosis not present

## 2018-09-15 DIAGNOSIS — D631 Anemia in chronic kidney disease: Secondary | ICD-10-CM | POA: Diagnosis not present

## 2018-09-15 DIAGNOSIS — I509 Heart failure, unspecified: Secondary | ICD-10-CM | POA: Diagnosis not present

## 2018-09-15 NOTE — Patient Outreach (Signed)
[facility]) Care Management  09/15/2018  Marrian Bells 04-05-1951 [id number]   Patient was called regarding medication management and help with insulin pump per referral. Unfortunately, she did not answer the phone. HIPAA compliant message was left on her voicemail.  Today's call was the 2nd unsuccessful call. Patient was sent an Unsuccessful Outreach Letter on 09/09/2018.   Plan: Call patient back in 7-10 business days.   [name], PharmD, [place] Clinical Pharmacist [phone]

## 2018-09-17 ENCOUNTER — Other Ambulatory Visit: Payer: Self-pay | Admitting: Family Medicine

## 2018-09-24 ENCOUNTER — Other Ambulatory Visit: Payer: Self-pay | Admitting: *Deleted

## 2018-09-24 NOTE — Patient Outreach (Signed)
[facility]) Care Management  09/24/2018  Marrion Finan 03-10-1951 [id number]   CSW made contact with pt today by phone. She reports still feeling depressed and states, "it's no better....just going down hill". She as little support and states her ability to go to a therapist is limited because of her husband; "he won't let me go...Marland Kitchenhe doesn't believe in depression".  She has an appointment with her PCP on 5/11 and CSW discussed the possible benefit of her having a conversation with PCP about her anti-depressant and possible options for RX adjustments. She has been on the current regimen for about one year.  Pt appreciative of CSW phone call- seems comforted by the conversation and support/reassurance.  She denies any SI/HI as well as denies any physical abuse.  CSW encouraged pt to call back if needs arise before planned follow up call in one week.  Eduard Clos, MSW, [place] Worker  [place] [phone]

## 2018-09-28 ENCOUNTER — Other Ambulatory Visit: Payer: Self-pay | Admitting: Pharmacist

## 2018-09-28 NOTE — Patient Outreach (Addendum)
[facility] Care Management  [place]   09/28/2018  Susan [name] 1950/11/15 [id number]  Reason for referral: Medication Review, Medication Management  Referral source: [facility] RN Current insurance: [employer]  PMHx includes but not limited to:  CHF, CAD, CKD, type 2 diabetes, hypertension, GAD, hyperlipidemia, hypothyroid, insomnia, diabetes, morbid obesity, pulmonary hypertension, PVD, and peripheral.  Outreach:  Successful telephone call with patient.  HIPAA identifiers verified.   Subjective:  Patient reports    Does the patient ever forget to take medication?  yes Does the patient have problems obtaining medications due to transportation?   no Does the patient have problems obtaining medications due to cost?  no  Does the patient feel that medications prescribed are effective?  yes Does the patient ever experience any side effects to the medications prescribed?  no  Does the patient measure his/her own blood glucose at home?  Yes   Objective: Lab Results  Component Value Date   CREATININE 1.85 (H) 05/31/2018   CREATININE 2.31 (H) 04/30/2018   CREATININE 2.18 (H) 04/26/2018    Lab Results  Component Value Date   HGBA1C 8.3 (H) 04/30/2018    Lipid Panel     Component Value Date/Time   CHOL 82 07/24/2017 0528   TRIG 166 (H) 07/24/2017 0528   HDL 24 (L) 07/24/2017 0528   CHOLHDL 3.4 07/24/2017 0528   VLDL 33 07/24/2017 0528   LDLCALC 25 07/24/2017 0528   LDLCALC 80 02/27/2017 1558    BP Readings from Last 3 Encounters:  06/07/18 130/68  05/31/18 (!) 146/102  04/30/18 123/60    Allergies  Allergen Reactions  . Codeine Nausea And Vomiting    Medications Reviewed Today    Reviewed by [name], [facility] (Pharmacist) on 09/28/18 at 1118  Med List Status: <None>  Medication Order Taking? Sig Documenting Provider Last Dose Status Informant  Ascorbic Acid (VITAMIN C) 250 MG CHEW [id number] Yes Chew 1 tablet by mouth  daily. [provider] Taking Active   aspirin EC 81 MG tablet [id number] Yes Take 81 mg by mouth daily. [provider] Taking Active Self  carvedilol (COREG) 12.5 MG tablet [id number] Yes TAKE 1 TABLET BY MOUTH TWICE A DAY WITH A MEAL [place], [name], MD Taking Active   cholecalciferol (VITAMIN D) 1000 units tablet [id number] Yes Take 1,000 Units by mouth daily. [provider] Taking Active Self  ciprofloxacin (CIPRO) 500 MG tablet [id number] Yes Take 500 mg by mouth 2 (two) times daily. [provider] Taking Active   clopidogrel (PLAVIX) 75 MG tablet [id number] Yes TAKE 1 TABLET BY MOUTH DAILY WITH BREAKFAST. Larey Dresser, MD Taking Active   escitalopram (LEXAPRO) 20 MG tablet [id number] Yes TAKE 1 TABLET BY MOUTH EVERY DAY [place], [name], MD Taking Active   ferrous sulfate 325 (65 FE) MG tablet [id number] Yes Take 1 tablet (325 mg total) by mouth 2 (two) times daily with a meal. Reported on 08/21/2015 Alycia Rossetti, MD Taking Active   Insulin Human (INSULIN PUMP) SOLN [id number] Yes Inject into the skin. [provider] Taking Active Self           Med Note Blanca Friend, JULIE D   Thu Jun 24, 2018 12:11 PM) Omnipod Insulin Pump System   insulin NPH Human (HUMULIN N,NOVOLIN N) 100 UNIT/ML injection [id number] Yes Inject into the skin daily as needed. Inject 10 units in the morning when blood sugar is 0-199. Inject 15 units in the morning if  blood sugar is >200. [provider] Taking Active Self  insulin regular (NOVOLIN R,HUMULIN R) 100 units/mL injection [id number] Yes Inject 5-15 Units into the skin See admin instructions. With OMNIPOD Sliding scale 80-150 5 units, 150-200 7 units, 201-250 10 units, 251-300 12 units, 301-400 15 units. [provider] Taking Active Self           Med Note Birdena Crandall Mar 18, 2018  8:28 PM)    KLOR-CON M10 10 MEQ tablet [id number] Yes TAKE 1 TABLET BY MOUTH EVERY MORNING WITH WATER PILL  [place], [name], MD Taking Active   levothyroxine (SYNTHROID, LEVOTHROID) 50 MCG tablet [id number] Yes Take 1 tablet (50 mcg total) by mouth daily before breakfast. [name], PA-C Taking Active Self  magnesium oxide (MAG-OX) 400 MG tablet [id number] Yes TAKE 1 TABLET BY MOUTH EVERY DAY [place], [name], MD Taking Active   nitroGLYCERIN (NITROSTAT) 0.4 MG SL tablet [id number] Yes PLACE 1 TABLET (0.4 MG TOTAL) UNDER THE TONGUE EVERY 5 (FIVE) MINUTES AS NEEDED FOR CHEST PAIN. [name], PA-C Taking Active Self           Med Note Adora Fridge   Tue Jan 13, 2017  2:23 PM)    nystatin (MYCOSTATIN/NYSTOP) powder [id number] Yes Apply topically 4 (four) times daily.  Patient taking differently:  Apply 1 g topically 4 (four) times daily as needed (for yeast).    [name], [name], MD Taking Active   nystatin cream Royden Purl) [id number] Yes Apply 1 application topically 2 (two) times daily.  Patient taking differently:  Apply 1 application topically 2 (two) times daily as needed (yeast/irritation.).    [name], [name], MD Taking Active   [facility] VERIO test strip [id number] Yes  [provider] Taking Active Self  pregabalin (LYRICA) 150 MG capsule [id number] Yes Take 1 capsule (150 mg total) by mouth 2 (two) times daily. Alycia Rossetti, MD Taking Active Self  rosuvastatin (CRESTOR) 20 MG tablet [id number] Yes TAKE 1 TABLET BY MOUTH EVERY DAY [place], [name], MD Taking Active Self  torsemide (DEMADEX) 20 MG tablet [id number] Yes Take 3 tablets (60 mg total) by mouth every morning AND 3 tablets (60 mg total) every evening. [place], [name], MD Taking Active   vitamin E (VITAMIN E) 1000 UNIT capsule [id number] Yes Take 1,000 Units by mouth daily. [provider] Taking Active Self          Assessment:  Drugs sorted by system:  Neurologic/Psychologic: Escitalopram, Pregabalin,   Cardiovascular: Aspirin, Carvedilol, Clopidogrel, Rosuvastatin, Torsemide,  Rosuvastatin, Nitroglycerin  Endocrine: Omnipod, Humulin N, Humulin R, Levothyroxine,   Topical: Nystatin powder, Nystatin Cream  Infectious Diseases: Ciprofloxacin  Vitamins/Minerals/Supplements: Cholecalciferol, Ferrous Sulfate, Klor Con, Magnesium Oxide, Vitamin E, Vitamin C  Medication Review Findings:   Most recent HgA1c- 13% (up from 8.3 in December of 2019)  . Reviewed patient's insulin regimen with her. She was very confused about the difference between Humulin N and R and how to follow the sliding scale directions given to her by her provider. . Used the teach-back method to help patient clarify her therapy. Patient communicated understanding. . Patient is also eating very high carbohydrate foods.   . She reported her blood sugar this morning as 410 mg/dl. We reviewed her insulin dose and discussed healthy food options. We also reviewed ways to decrease her carbohydrate intake. . Patient expressed that she was afraid her blood sugar would go too low if she used both Humulin  N and R at the same time as prescribed.  She was encouraged and assured. In addition, patient was educated to check her blood sugar 2 hours after eating and before bedtime to be sure she was not going too low. . Patient was challenged to be mindful of her food choices for the rest of the day and to walk around the large porch at her house at least once. . A great deal of time was spent empowering patient to advocate for herself and be her own cheerleader.   Medication Adherence Findings: Adherence Review  []  Excellent (no doses missed/week)     []  Good (no more than 1 dose missed/week)     [x]  Partial (2-3 doses missed/week) []  Poor (>3 doses missed/week)  Patient with poor understanding of regimen and fair understanding of indications.    Medication Assistance Findings:  No medication assistance needs identified  Extra Help:  Not eligible for Extra Help Low Income Subsidy based on reported income  and assets  Additional medication assistance options reviewed with patient as warranted:  No other options identified  Plan: . Will route note to PCP..  . Will follow-up in tomorrow.[name], PharmD, [place] Clinical Pharmacist [phone]

## 2018-09-29 ENCOUNTER — Other Ambulatory Visit: Payer: Self-pay | Admitting: Pharmacist

## 2018-09-29 ENCOUNTER — Ambulatory Visit: Payer: Self-pay | Admitting: Pharmacist

## 2018-09-29 NOTE — Patient Outreach (Signed)
[place] [name]) Care Management  09/29/2018  Susan [name] August 17, 1950 [id number]  Patient was called to follow up on insulin regimen. Unfortunately, she did not answer either number listed in her chart. HIPAA compliant messages were left on each number.  Plan: Call patient back in 1-2 business days to tuck in prior to weekend.   [name], PharmD, [place] Clinical Pharmacist [phone]

## 2018-09-30 ENCOUNTER — Other Ambulatory Visit: Payer: Self-pay | Admitting: Pharmacist

## 2018-09-30 NOTE — Patient Outreach (Signed)
[place] [facility]) Care Management  09/30/2018  Louretta Tantillo 06/02/1950 [id number]  Called patient to follow up on her insulin therapy. HIPAA identifiers were obtained.   Patient reported her blood sugar was 259 mg/dl this morning. She took 15 units of Humulin N. She did not take the Humulin R.  She said she was confused but will try again tomorrow.  She reported walking around her porch as instructed with her puppies.  Patient's voice sounded much better.   She communicated understanding of her sliding scale.  Patient was celebrated for taking her Humulin N as prescribed.  Plan: Call patient back in 2-3 business days.   [name], PharmD, [place] Clinical Pharmacist [phone]

## 2018-10-01 ENCOUNTER — Ambulatory Visit: Payer: Self-pay | Admitting: Pharmacist

## 2018-10-01 ENCOUNTER — Other Ambulatory Visit: Payer: Self-pay | Admitting: *Deleted

## 2018-10-01 NOTE — Patient Outreach (Signed)
[place] [facility]) Care Management  10/01/2018  Susan [name] 1950/06/29 [id number]   CSW spoke with pt by phone today. She is planning to have a telephonic PCP visit on Monday. CSW offered to call PCP to brief them on the concerns around her depression and treatment plan. CSW will plan a phone call to PCP office Monday morning and with pt later in the day.    Eduard Clos, MSW, [place] Worker  [place] [phone]

## 2018-10-04 ENCOUNTER — Encounter: Payer: Self-pay | Admitting: Family Medicine

## 2018-10-04 ENCOUNTER — Other Ambulatory Visit: Payer: Self-pay | Admitting: *Deleted

## 2018-10-04 ENCOUNTER — Other Ambulatory Visit: Payer: Self-pay | Admitting: Pharmacist

## 2018-10-04 ENCOUNTER — Ambulatory Visit: Payer: Self-pay | Admitting: Pharmacist

## 2018-10-04 ENCOUNTER — Other Ambulatory Visit: Payer: Self-pay

## 2018-10-04 ENCOUNTER — Telehealth: Payer: Self-pay | Admitting: *Deleted

## 2018-10-04 ENCOUNTER — Ambulatory Visit (INDEPENDENT_AMBULATORY_CARE_PROVIDER_SITE_OTHER): Payer: PPO | Admitting: Family Medicine

## 2018-10-04 DIAGNOSIS — E1122 Type 2 diabetes mellitus with diabetic chronic kidney disease: Secondary | ICD-10-CM

## 2018-10-04 DIAGNOSIS — F331 Major depressive disorder, recurrent, moderate: Secondary | ICD-10-CM

## 2018-10-04 DIAGNOSIS — F5101 Primary insomnia: Secondary | ICD-10-CM

## 2018-10-04 DIAGNOSIS — I2510 Atherosclerotic heart disease of native coronary artery without angina pectoris: Secondary | ICD-10-CM | POA: Diagnosis not present

## 2018-10-04 DIAGNOSIS — E1165 Type 2 diabetes mellitus with hyperglycemia: Secondary | ICD-10-CM

## 2018-10-04 DIAGNOSIS — N184 Chronic kidney disease, stage 4 (severe): Secondary | ICD-10-CM

## 2018-10-04 MED ORDER — TRAZODONE HCL 50 MG PO TABS: 25.0000 mg | ORAL_TABLET | Freq: Every evening | ORAL | 3 refills | Status: DC | PRN

## 2018-10-04 NOTE — Telephone Encounter (Signed)
Pt has appt this afternoon

## 2018-10-04 NOTE — Patient Outreach (Signed)
[place] [facility]) Care Management  10/04/2018  Markeisha Mancias [birth year] [id number]   [place] contacted pt's PCP office and spoke with Margreta Journey, Dr Dorian Heckle Nurse. CSW briefed her on concerns and the hope they can make recommendations/med changes to help with her mental health needs.   CSW will also plan to check in with pt later this week. Eduard Clos, MSW, [place] Worker  [place] [phone]
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
## 2018-10-04 NOTE — Patient Outreach (Signed)
[place] [facility]) Care Management  10/04/2018  Susan [name] 06-13-1950 [id number]   Patient was called to follow up. Unfortunately, she did not answer the phone. HIPAA compliant message left on her voice mail on her home phone. There was no answer at her mobile number.  Plan: Call patient back in 10-14 days.  [name], PharmD, [place] Clinical Pharmacist [phone]

## 2018-10-05 ENCOUNTER — Ambulatory Visit: Payer: Self-pay | Admitting: *Deleted

## 2018-10-06 ENCOUNTER — Other Ambulatory Visit: Payer: Self-pay | Admitting: *Deleted

## 2018-10-06 NOTE — Patient Outreach (Signed)
[place] [facility]) Care Management  10/06/2018  Susan [name] 1950-11-20 [id number]   CSW attempted to call pt after her PCP appointment yesterday for updates and followup. CSW left HIPPA compliant voice message and will outreach in 3-4 business days per policy.  Eduard Clos, MSW, [place] Worker  [place] [phone]

## 2018-10-07 ENCOUNTER — Other Ambulatory Visit: Payer: Self-pay

## 2018-10-07 ENCOUNTER — Other Ambulatory Visit: Payer: Self-pay | Admitting: Licensed Clinical Social Worker
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
## 2018-10-11 ENCOUNTER — Other Ambulatory Visit: Payer: Self-pay | Admitting: *Deleted

## 2018-10-11 ENCOUNTER — Ambulatory Visit: Payer: Self-pay | Admitting: *Deleted

## 2018-10-11 NOTE — Patient Outreach (Signed)
[place] [facility]) Care Management  10/11/2018  Susan [name] 1950-08-15 [id number]   CSW made a second attempt reach patient to follow-up from PCP visit but patient did not answer. CSW left HIPPA compliant voicemail (ph#: [phone]) and had CMA mail out unsuccessful outreach letter. CSW will make 3rd attempt to reach patient in 4 days. CSW will make a third outreach attempt in 10 days, if CSW does not receive a return call from patient in the meantime.      Raynaldo Opitz, LCSW [facility]  Clinical Social Worker cell #: [phone]

## 2018-10-12 ENCOUNTER — Other Ambulatory Visit: Payer: Self-pay

## 2018-10-12 ENCOUNTER — Ambulatory Visit (INDEPENDENT_AMBULATORY_CARE_PROVIDER_SITE_OTHER): Payer: PPO | Admitting: Orthopedic Surgery

## 2018-10-12 ENCOUNTER — Encounter: Payer: Self-pay | Admitting: Orthopedic Surgery

## 2018-10-12 VITALS — Ht 65.0 in | Wt 254.0 lb

## 2018-10-12 DIAGNOSIS — M720 Palmar fascial fibromatosis [Dupuytren]: Secondary | ICD-10-CM

## 2018-10-12 DIAGNOSIS — M65341 Trigger finger, right ring finger: Secondary | ICD-10-CM

## 2018-10-12 DIAGNOSIS — M79641 Pain in right hand: Secondary | ICD-10-CM

## 2018-10-12 MED ORDER — LIDOCAINE HCL 1 % IJ SOLN
1.0000 mL | INTRAMUSCULAR | Status: AC | PRN
  Administered 2018-10-12: 1 mL

## 2018-10-12 MED ORDER — METHYLPREDNISOLONE ACETATE 40 MG/ML IJ SUSP
40.0000 mg | INTRAMUSCULAR | Status: AC | PRN
  Administered 2018-10-12: 40 mg

## 2018-10-12 NOTE — Progress Notes (Signed)
Office Visit Note   Patient: Susan [name]           Date of Birth: 1950-08-09           MRN: [id number] Visit Date: 10/12/2018              Requested by: Alycia Rossetti, MD [address] [place],  [address] PCP: Alycia Rossetti, MD  Chief Complaint  Patient presents with  . Right Hand - Follow-up, Pain  . Left Foot - Pain      HPI: Patient is a 68 year old woman who presents for several issues.  She states she has been having a 2-week history of popping and catching and triggering of the right hand ring finger.  She states she cannot bend the finger fully.  She also complains of venous stasis swelling she was wearing her compression stocking she also complains of onychomycotic nails with some bleeding from the left great toe onychomycotic nail.  Patient states that she thinks she has gout and has had redness and swelling of multiple joints.  Patient also has some dog scratches on her left arm.  Assessment & Plan: Visit Diagnoses:  1. Pain of right hand   2. Trigger finger, right ring finger   3. Dupuytren's disease of palm of right hand     Plan: Recommended antibiotic ointment for the scratches on the left arm a uric acid level was drawn.  The trigger finger was injected recommended compression stockings and nails were trimmed.  Discussed that she does have some mild Dupuytren's contracture to the right palm.  Follow-Up Instructions: Return in about 3 weeks (around 11/02/2018).   Ortho Exam  Patient is alert, oriented, no adenopathy, well-dressed, normal affect, normal respiratory effort. Examination patient has brawny skin color changes and venous swelling of her legs but there are no ulcers.  She has thickened discolored onychomycotic nails x5 on the left the nails were trimmed x5 without complications.  There were no plantar ulcers.  Examination the right hand she does have a mild Dupuytren's band over the palm of the ring finger.  She lacks about 10 degrees to  full extension of the ring finger and has pain and catching with active flexion.  Pain worse to palpation over the A1 pulley.  Uric acid level was drawn her uric acid was 9 last year.  She is not on gout medication.  Imaging: No results found. No images are attached to the encounter.  Labs: Lab Results  Component Value Date   HGBA1C 8.3 (H) 04/30/2018   HGBA1C 9.3 (H) 09/25/2017   HGBA1C 11.4 (H) 07/16/2017   CRP 8.4 (H) 03/05/2018   LABURIC 9.0 (H) 03/06/2018   REPTSTATUS 03/21/2018 FINAL 03/18/2018   CULT (A) 03/18/2018    20,000 COLONIES/mL KLEBSIELLA PNEUMONIAE 20,000 COLONIES/mL ENTEROCOCCUS FAECALIS    LABORGA KLEBSIELLA PNEUMONIAE (A) 03/18/2018   LABORGA ENTEROCOCCUS FAECALIS (A) 03/18/2018     Lab Results  Component Value Date   ALBUMIN 3.0 (L) 03/19/2018   ALBUMIN 3.8 03/18/2018   ALBUMIN 2.6 (L) 02/14/2018   LABURIC 9.0 (H) 03/06/2018    Body mass index is 42.27 kg/m.  Orders:  Orders Placed This Encounter  Procedures  . Uric acid   No orders of the defined types were placed in this encounter.    Procedures: Hand/UE Inj: R ring A1 for trigger finger on 10/12/2018 2:59 PM Indications: diagnostic and therapeutic Details: 22 G needle, volar approach Medications: 1 mL lidocaine 1 %;  40 mg methylPREDNISolone acetate 40 MG/ML Outcome: tolerated well, no immediate complications Procedure, treatment alternatives, risks and benefits explained, specific risks discussed. Consent was given by the patient. Immediately prior to procedure a time out was called to verify the correct patient, procedure, equipment, support staff and site/side marked as required. Patient was prepped and draped in the usual sterile fashion.      Clinical Data: No additional findings.  ROS:  All other systems negative, except as noted in the HPI. Review of Systems  Objective: Vital Signs: Ht 5\' 5"  (1.651 m)   Wt 254 lb (115.2 kg)   BMI 42.27 kg/m   Specialty Comments:  No  specialty comments available.  PMFS History: Patient Active Problem List   Diagnosis Date Noted  . Onychomycosis 06/21/2018  . Pressure injury of skin 03/19/2018  . Syncope 03/18/2018  . Osteomyelitis of second toe of right foot ([place])   . Toe osteomyelitis, right ([place])   . 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 ([place])   . Acute on chronic diastolic (congestive) heart failure ([place]) 02/13/2018  . PVD (peripheral vascular disease) ([place]) 10/26/2017  . Hypothyroid 07/27/2017  . PAH (pulmonary artery hypertension) ([place])   . Impaired ambulation 07/19/2017  . Hyperosmolar non-ketotic state in patient with type 2 diabetes mellitus ([place]) 07/15/2017  . Leg cramps 02/27/2017  . Peripheral edema 01/12/2017  . Diabetic neuropathy ([place]) 11/12/2016  . 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 mellitus ([place]) 10/24/2015  . Anemia 10/03/2015  . Generalized anxiety disorder 10/03/2015  . Insomnia 10/03/2015  . Diabetes mellitus type II, uncontrolled ([place]) 06/07/2015  . Chronic diastolic CHF (congestive heart failure) ([place]) 06/07/2015  . Non compliance with medical treatment 04/17/2014  . Rotator cuff tear 03/14/2014  . Morbid obesity ([place]) 09/23/2013  . Hypotension 12/25/2012  . Urinary incontinence   . MDD (major depressive disorder) 11/12/2010  . RBBB (right bundle branch block)   . CAD (coronary artery disease)   . Hyperlipemia 01/22/2009  . Essential hypertension 01/22/2009   Past Medical History:  Diagnosis Date  . Acute MI ([place]) 1999; 2007  . Anemia    hx  . Anginal pain ([place])   . Anxiety   . ARF (acute renal failure) ([place]) 06/2017   [facility]  . Arthritis    "generalized" (03/15/2014)  . CAD (coronary artery disease)    MI in 2000 - MI  2007 - treated bare metal stent (no nuclear since then as 9/11)  . Carotid artery disease ([place])   . CHF (congestive heart failure) ([place])   . Chronic diastolic heart failure (HCC)    a) ECHO (08/2013) EF 55-60% and RV  function nl b) RHC (08/2013) RA 4, RV 30/5/7, PA 25/10 (16), PCWP 7, Fick CO/CI 6.3/2.7, PVR 1.5 WU, PA 61 and 66%  . Daily headache    "~ every other day; since I fell in June" (03/15/2014)  . Depression   . Dyslipidemia   . Dyspnea   . Exertional shortness of breath   . HTN (hypertension)   . Hypothyroidism   . Neuropathy   . Obesity   . Osteoarthritis   . Peripheral neuropathy   . PONV (postoperative nausea and vomiting)   . RBBB (right bundle branch block)    Old  . Stroke [facility])    mini strokes  . Syncope    likely due to low blood sugar  . Tachycardia    Sinus tachycardia  . Type II diabetes  mellitus ([place])    Type II  . Urinary incontinence   . Venous insufficiency     Family History  Problem Relation Age of Onset  . Heart attack Mother 59    Past Surgical History:  Procedure Laterality Date  . ABDOMINAL HYSTERECTOMY  1980's  . AMPUTATION Right 02/24/2018   Procedure: RIGHT FOOT GREAT TOE AND 2ND TOE AMPUTATION;  Surgeon: Newt Minion, MD;  Location: [facility];  Service: Orthopedics;  Laterality: Right;  . AMPUTATION Right 04/30/2018   Procedure: RIGHT TRANSMETATARSAL AMPUTATION;  Surgeon: Newt Minion, MD;  Location: [facility];  Service: Orthopedics;  Laterality: Right;  . CATARACT EXTRACTION, BILATERAL Bilateral ?2013  . COLONOSCOPY W/ POLYPECTOMY    . CORONARY ANGIOPLASTY WITH STENT PLACEMENT  1999; 2007   "1 + 1"  . EYE SURGERY Bilateral    [facility]  . KNEE ARTHROSCOPY Left 10/25/2006  . RIGHT HEART CATH N/A 07/24/2017   Procedure: RIGHT HEART CATH;  Surgeon: Jolaine Artist, MD;  Location: [facility] CV LAB;  Service: Cardiovascular;  Laterality: N/A;  . RIGHT HEART CATHETERIZATION N/A 09/22/2013   Procedure: RIGHT HEART CATH;  Surgeon: Jolaine Artist, MD;  [facility]: [facility] C[facility];  Service: Cardiovascular;  Laterality: N/A;  . SHOULDER ARTHROSCOPY WITH OPEN ROTATOR CUFF REPAIR Right 03/14/2014   Procedure: RIGHT SHOULDER ARTHROSCOPY WITH BICEPS RELEASE, OPEN  SUBSCAPULA REPAIR, OPEN SUPRASPINATUS REPAIR.;  Surgeon: Meredith Pel, MD;  Location: [facility];  Service: Orthopedics;  Laterality: Right;  . TOE AMPUTATION Right 02/24/2018   GREAT TOE AND 2ND TOE AMPUTATION  . TUBAL LIGATION  1970's   Social History   Occupational History    Employer: UNEMPLOYED  Tobacco Use  . Smoking status: Former Smoker    Packs/day: 3.00    Years: 32.00    Pack years: 96.00    Types: Cigarettes    Last attempt to quit: 10/24/1997    Years since quitting: 20.9  . Smokeless tobacco: Never Used  Substance and Sexual Activity  . Alcohol use: Not Currently    Comment: "might have 2-3 daiquiris in the summer"  . Drug use: No  . Sexual activity: Never

## 2018-10-13 LAB — URIC ACID: Uric Acid, Serum: 9.1 mg/dL — ABNORMAL HIGH (ref 2.5–7.0)

## 2018-10-14 ENCOUNTER — Telehealth: Payer: Self-pay

## 2018-10-14 ENCOUNTER — Other Ambulatory Visit: Payer: Self-pay

## 2018-10-14 MED ORDER — ALLOPURINOL 100 MG PO TABS: 100.0000 mg | ORAL_TABLET | Freq: Two times a day (BID) | ORAL | 12 refills | Status: DC

## 2018-10-14 MED ORDER — COLCHICINE 0.6 MG PO TABS: 0.6000 mg | ORAL_TABLET | Freq: Every day | ORAL | 12 refills | Status: DC

## 2018-10-14 NOTE — Telephone Encounter (Signed)
-----   Message from Newt Minion, MD sent at 10/14/2018  9:03 AM EDT ----- Still has gout, needs colchicine and allopurinol

## 2018-10-14 NOTE — Telephone Encounter (Signed)
Called pt and she states that she was given gout medication at her last check last year but did not have this refilled. I advised that the allopurinol is to be taken twice a day as a maintenance  medication and the colchicine to be taken as needed for flare up. This medication was sent to the pharmacy and also cancelled appt for 3 week return and moved to 11/23/18 appt for recheck on labs. Pt voiced understanding and she will call with questions.

## 2018-10-14 NOTE — Telephone Encounter (Signed)
I called and lm on vm for pt to advise that I would like for her to call back so we can discuss lab results. I do have questions about medication. I will hold this message and try the patient again later on today.

## 2018-10-15 ENCOUNTER — Ambulatory Visit: Payer: Self-pay | Admitting: Pharmacist

## 2018-10-15 ENCOUNTER — Other Ambulatory Visit: Payer: Self-pay | Admitting: Pharmacist

## 2018-10-15 NOTE — Patient Outreach (Signed)
[facility]) Care Management  10/15/2018  Ichelle Harral 1951-05-21 [id number]  Patient was called to follow up on insulin therapy management. Unfortunately, she did not answer the phone. HIPAA compliant messages were left on both her home phone and mobile.  Plan: Call patient back in 3-4 weeks. She is also working the [employer], [place].  [name], PharmD, [place] Clinical Pharmacist [phone]

## 2018-10-19 ENCOUNTER — Encounter: Payer: Self-pay | Admitting: Family Medicine

## 2018-10-19 ENCOUNTER — Other Ambulatory Visit: Payer: Self-pay

## 2018-10-19 ENCOUNTER — Ambulatory Visit (INDEPENDENT_AMBULATORY_CARE_PROVIDER_SITE_OTHER): Payer: PPO | Admitting: Family Medicine

## 2018-10-19 VITALS — BP 110/56 | HR 72 | Temp 97.6°F | Resp 18 | Ht 66.0 in | Wt 260.2 lb

## 2018-10-19 DIAGNOSIS — N184 Chronic kidney disease, stage 4 (severe): Secondary | ICD-10-CM

## 2018-10-19 DIAGNOSIS — I10 Essential (primary) hypertension: Secondary | ICD-10-CM | POA: Diagnosis not present

## 2018-10-19 DIAGNOSIS — I5032 Chronic diastolic (congestive) heart failure: Secondary | ICD-10-CM

## 2018-10-19 DIAGNOSIS — F5101 Primary insomnia: Secondary | ICD-10-CM | POA: Diagnosis not present

## 2018-10-19 DIAGNOSIS — E1122 Type 2 diabetes mellitus with diabetic chronic kidney disease: Secondary | ICD-10-CM

## 2018-10-19 DIAGNOSIS — F331 Major depressive disorder, recurrent, moderate: Secondary | ICD-10-CM

## 2018-10-19 MED ORDER — TRAZODONE HCL 100 MG PO TABS: 100.0000 mg | ORAL_TABLET | Freq: Every evening | ORAL | 1 refills | Status: DC | PRN

## 2018-10-19 NOTE — Patient Instructions (Addendum)
Take 2 of the 50mg  trazodone pills until you run out Then take the new trazodone 100mg  once a day  F/U 2 months

## 2018-10-19 NOTE — Assessment & Plan Note (Signed)
She has gained some weight but I do not think that she has been adhering to the proper diet which she agrees to.  She does not have any fluid overload noted in the chest area.  Recommend that she do take her diuretic twice a day as prescribed.

## 2018-10-19 NOTE — Assessment & Plan Note (Signed)
>>  ASSESSMENT AND PLAN FOR CHRONIC DIASTOLIC CHF (CONGESTIVE HEART FAILURE) (HCC) WRITTEN ON 10/19/2018  4:59 PM BY Milinda Antis F  She has gained some weight but I do not think that she has been adhering to the proper diet which she agrees to.  She does not have any fluid overload noted in the chest area.  Recommend that she do take her diuretic twice a day as prescribed.

## 2018-10-19 NOTE — Assessment & Plan Note (Signed)
Blood pressure is well-controlled no change in medication 
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
## 2018-10-19 NOTE — Assessment & Plan Note (Addendum)
Major depression with anxiety and insomnia.  We will increase her trazodone to 100 mg at bedtime continue Lexapro. Per my previous phone note as well as tried healthcare note we have offered assistance to her for therapy community resources she is declining at this time.

## 2018-10-22 NOTE — Patient Outreach (Signed)
LCSW listed the client status for Susan [name] on 10/07/2018 as "Inactive". This was an error by LCSW. Client status was changed to "Active" on 10/22/2018.  [name].Seda Kronberg MSW, LCSW Licensed Clinical Social Worker [facility] Care Management [phone]

## 2018-10-26 DIAGNOSIS — E113513 Type 2 diabetes mellitus with proliferative diabetic retinopathy with macular edema, bilateral: Secondary | ICD-10-CM | POA: Diagnosis not present

## 2018-11-02 ENCOUNTER — Ambulatory Visit: Payer: PPO | Admitting: Orthopedic Surgery

## 2018-11-11 ENCOUNTER — Other Ambulatory Visit: Payer: Self-pay

## 2018-11-11 NOTE — Patient Outreach (Signed)
[facility]) Care Management  11/11/2018  Susan [name] 1950-07-08 [id number]    RN Health Coach closing the program.  Patient is transitioning to external program [facility] for continued case management.   [name] RN, BSN, [place] Direct Dial:  [phone]  Fax: [phone]

## 2018-11-12 ENCOUNTER — Ambulatory Visit: Payer: Self-pay | Admitting: Pharmacist

## 2018-11-12 ENCOUNTER — Other Ambulatory Visit: Payer: Self-pay | Admitting: Pharmacist

## 2018-11-12 ENCOUNTER — Other Ambulatory Visit: Payer: Self-pay | Admitting: Family Medicine

## 2018-11-12 NOTE — Patient Outreach (Signed)
[facility]) Care Management  11/12/2018  Susan [name] 1950/08/03 [id number]   Patient was called for [place] Follow up . Unfortunately, she did not answer her phone. HIPAA compliant message was left on the patient's voicemail.  Today's call was the 3rd unsuccessful call. Patient is part of the CSNP program.  Plan: Call patient back in 3 months for CSNP follow up.   [name], PharmD, [place] Clinical Pharmacist [phone]

## 2018-11-15 ENCOUNTER — Other Ambulatory Visit (HOSPITAL_COMMUNITY): Payer: Self-pay | Admitting: *Deleted

## 2018-11-18 ENCOUNTER — Other Ambulatory Visit: Payer: Self-pay | Admitting: *Deleted

## 2018-11-18 MED ORDER — ALBUTEROL SULFATE HFA 108 (90 BASE) MCG/ACT IN AERS: 2.0000 | INHALATION_SPRAY | Freq: Four times a day (QID) | RESPIRATORY_TRACT | 1 refills | Status: DC | PRN

## 2018-11-19 ENCOUNTER — Other Ambulatory Visit: Payer: Self-pay | Admitting: Pharmacist

## 2018-11-19 NOTE — Patient Outreach (Addendum)
[facility]) Care Management  11/19/2018  Hafsah Hendler 10-15-1950 [id number]   Patient called me back from my call to her last week and left a message on my voicemail. Patient was called back today. Unfortunately, she did not answer the phone. HIPAA compliant message was left on her voicemail.   Plan: Await call back from patient. Call patient back at original follow up date.  [name], PharmD, [place] Clinical Pharmacist [phone]

## 2018-11-22 ENCOUNTER — Other Ambulatory Visit: Payer: Self-pay

## 2018-11-22 ENCOUNTER — Ambulatory Visit (HOSPITAL_COMMUNITY)
Admission: RE | Admit: 2018-11-22 | Discharge: 2018-11-22 | Disposition: A | Discharge status: Home / Self Care | Payer: PPO | Source: Ambulatory Visit | Attending: Cardiology | Admitting: Cardiology

## 2018-11-22 VITALS — BP 184/122 | HR 94 | Wt 270.2 lb

## 2018-11-22 DIAGNOSIS — I2510 Atherosclerotic heart disease of native coronary artery without angina pectoris: Secondary | ICD-10-CM | POA: Diagnosis not present

## 2018-11-22 DIAGNOSIS — Z8249 Family history of ischemic heart disease and other diseases of the circulatory system: Secondary | ICD-10-CM | POA: Insufficient documentation

## 2018-11-22 DIAGNOSIS — Z8673 Personal history of transient ischemic attack (TIA), and cerebral infarction without residual deficits: Secondary | ICD-10-CM | POA: Insufficient documentation

## 2018-11-22 DIAGNOSIS — E1140 Type 2 diabetes mellitus with diabetic neuropathy, unspecified: Secondary | ICD-10-CM | POA: Diagnosis not present

## 2018-11-22 DIAGNOSIS — E669 Obesity, unspecified: Secondary | ICD-10-CM | POA: Diagnosis not present

## 2018-11-22 DIAGNOSIS — Z6841 Body Mass Index (BMI) 40.0 and over, adult: Secondary | ICD-10-CM | POA: Insufficient documentation

## 2018-11-22 DIAGNOSIS — Z79899 Other long term (current) drug therapy: Secondary | ICD-10-CM | POA: Diagnosis not present

## 2018-11-22 DIAGNOSIS — E1122 Type 2 diabetes mellitus with diabetic chronic kidney disease: Secondary | ICD-10-CM | POA: Diagnosis not present

## 2018-11-22 DIAGNOSIS — I5032 Chronic diastolic (congestive) heart failure: Secondary | ICD-10-CM | POA: Diagnosis not present

## 2018-11-22 DIAGNOSIS — E1165 Type 2 diabetes mellitus with hyperglycemia: Secondary | ICD-10-CM | POA: Insufficient documentation

## 2018-11-22 DIAGNOSIS — E785 Hyperlipidemia, unspecified: Secondary | ICD-10-CM | POA: Insufficient documentation

## 2018-11-22 DIAGNOSIS — I428 Other cardiomyopathies: Secondary | ICD-10-CM | POA: Insufficient documentation

## 2018-11-22 DIAGNOSIS — I4510 Unspecified right bundle-branch block: Secondary | ICD-10-CM | POA: Insufficient documentation

## 2018-11-22 DIAGNOSIS — N184 Chronic kidney disease, stage 4 (severe): Secondary | ICD-10-CM | POA: Diagnosis not present

## 2018-11-22 DIAGNOSIS — R0602 Shortness of breath: Secondary | ICD-10-CM | POA: Insufficient documentation

## 2018-11-22 DIAGNOSIS — Z87891 Personal history of nicotine dependence: Secondary | ICD-10-CM | POA: Insufficient documentation

## 2018-11-22 DIAGNOSIS — I872 Venous insufficiency (chronic) (peripheral): Secondary | ICD-10-CM | POA: Diagnosis not present

## 2018-11-22 DIAGNOSIS — I130 Hypertensive heart and chronic kidney disease with heart failure and stage 1 through stage 4 chronic kidney disease, or unspecified chronic kidney disease: Secondary | ICD-10-CM | POA: Insufficient documentation

## 2018-11-22 DIAGNOSIS — I252 Old myocardial infarction: Secondary | ICD-10-CM | POA: Insufficient documentation

## 2018-11-22 DIAGNOSIS — Z9641 Presence of insulin pump (external) (internal): Secondary | ICD-10-CM | POA: Insufficient documentation

## 2018-11-22 DIAGNOSIS — E039 Hypothyroidism, unspecified: Secondary | ICD-10-CM | POA: Insufficient documentation

## 2018-11-22 DIAGNOSIS — Z7982 Long term (current) use of aspirin: Secondary | ICD-10-CM | POA: Diagnosis not present

## 2018-11-22 DIAGNOSIS — I25119 Atherosclerotic heart disease of native coronary artery with unspecified angina pectoris: Secondary | ICD-10-CM | POA: Insufficient documentation

## 2018-11-22 DIAGNOSIS — Z794 Long term (current) use of insulin: Secondary | ICD-10-CM | POA: Diagnosis not present

## 2018-11-22 LAB — BASIC METABOLIC PANEL
Anion gap: 7 (ref 5–15)
BUN: 42 mg/dL — ABNORMAL HIGH (ref 8–23)
CO2: 24 mmol/L (ref 22–32)
Calcium: 9.1 mg/dL (ref 8.9–10.3)
Chloride: 110 mmol/L (ref 98–111)
Creatinine, Ser: 1.48 mg/dL — ABNORMAL HIGH (ref 0.44–1.00)
GFR calc Af Amer: 41 mL/min — ABNORMAL LOW (ref >60)
GFR calc non Af Amer: 36 mL/min — ABNORMAL LOW (ref >60)
Glucose, Bld: 66 mg/dL — ABNORMAL LOW (ref 70–99)
Potassium: 4.7 mmol/L (ref 3.5–5.1)
Sodium: 141 mmol/L (ref 135–145)

## 2018-11-22 MED ORDER — TORSEMIDE 20 MG PO TABS: 80.0000 mg | ORAL_TABLET | Freq: Two times a day (BID) | ORAL | 3 refills | Status: DC

## 2018-11-22 MED ORDER — METOLAZONE 2.5 MG PO TABS: 2.5000 mg | ORAL_TABLET | ORAL | 0 refills | Status: DC

## 2018-11-22 NOTE — Progress Notes (Signed)
fPatient ID: Susan [name], female   DOB: 1950/09/07, 68 y.o.   MRN: [id number]   PCP: Dr Scot Dock  Cardiologist: Dr Ron Parker  Endocrinologist: Dr Bubba Camp  Primary HF Cardiologist: Dr. Haroldine Laws  Nephrology: Dr Hollie Salk  [facility] Patient  HPI: Susan [name] is a 68 y.o. female with a history of RBBB, DM, Hypothyroid, diastolic heart failure, CAD MI 2000/2007 BMS 2007, TIA, obesity, CKD Stage IV,  R transmetatarsal amputation. .    Admitted June 1st through June 3rd, 2017 with altered mental status, hypothermia, and weakness. Diuretics initially held and later restarted.   Admitted 2/27-07/28/17 for AKI. Torsemide was held and restarted at 60 mg BID. She had RHC 07/24/17 that showed mild PAH with evidence of RV strain likely due to OHS/OSA. She was discharged to SNF.  Admitted 01/3733 with A/C diastolic HF. Diuresed with IV lasix.   Admitted 10/24-10/27/19 with syncope. She was orthostatic. Had AKI with creatinine up to 3.33 and required IVF. Renal US showed no hydronephrosis. Losartan and imdur were DC'd. Creatinine 1.76 on day of DC.   Today she returns for HF follow up. Overall feeling fair. Falling at home 2-3 times a week. SOB with exertion. Denies PND/Orthopnea. Appetite ok. Eating fast food 2-3 days a week. No fever or chills. Weight at home up 10 pounds. Taking all medications.   ECHO 07/11/2015- 50-55%.  Grade I DD Echo 07/23/17: EF 55-60%, grade 1 DD Echo 03/19/18: EF 55-60%, grade 1 DD  RHC 07/24/17: RA = 17 RV = 48/15 PA = 49/11 (30) PCW = 18 Fick cardiac output/index = 7.0/3.0 PVR = 0.5 WU Ao sat = 97% PA sat = 62%, 64% 1. Normal left-sided pressures 2. Mild PAH with evidence of RV strain likely due to OHS/OSA 3. Normal cardiac output  RHC 09/22/13: RA = 4  RV = 30/5/7  PA = 25/10 (16)  PCW = 7  Fick cardiac output/index = 6.3/2.7  PVR = 1.5 WU   FA sat = 93%  PA sat = 61%, 66%   ABI 11/04/2017 Right: The right toe-brachial index is normal. RT great toe pressure = 127 mmHg.  Although ankle brachial indices are within normal limits (0.95-1.29), arterial Doppler waveforms at the ankle suggest some component of arterial occlusive disease. Left: The left toe-brachial index is abnormal. LT Great toe pressure = 115 mmHg. Although ankle brachial indices are within normal limits (0.95-1.29), arterial Doppler waveforms at the ankle suggest some component of arterial occlusive disease.  SH: Former smoker quit 15 year ago. Does drink alcohol. She is disabled. Lives at home with her husband and disabled son - Husband is truck Geophysicist/field seismologist. Son has a brain injury after he attempted suicide  A few years ago. .    FH: Mom MI  Review of systems complete and found to be negative unless listed in HPI.   Past Medical History:  Diagnosis Date  . Acute MI ([place]) 1999; 2007  . Anemia    hx  . Anginal pain ([place])   . Anxiety   . ARF (acute renal failure) ([place]) 06/2017   [facility]  . Arthritis    "generalized" (03/15/2014)  . CAD (coronary artery disease)    MI in 2000 - MI  2007 - treated bare metal stent (no nuclear since then as 9/11)  . Carotid artery disease ([place])   . CHF (congestive heart failure) ([place])   . Chronic diastolic heart failure (HCC)    a) ECHO (08/2013) EF 55-60% and RV function nl  b) RHC (08/2013) RA 4, RV 30/5/7, PA 25/10 (16), PCWP 7, Fick CO/CI 6.3/2.7, PVR 1.5 WU, PA 61 and 66%  . Daily headache    "~ every other day; since I fell in June" (03/15/2014)  . Depression   . Dyslipidemia   . Dyspnea   . Exertional shortness of breath   . HTN (hypertension)   . Hypothyroidism   . Neuropathy   . Obesity   . Osteoarthritis   . Peripheral neuropathy   . PONV (postoperative nausea and vomiting)   . RBBB (right bundle branch block)    Old  . Stroke [facility])    mini strokes  . Syncope    likely due to low blood sugar  . Tachycardia    Sinus tachycardia  . Type II diabetes mellitus (HCC)    Type II  . Urinary incontinence   . Venous insufficiency      Current Outpatient Medications  Medication Sig Dispense Refill  . albuterol (VENTOLIN HFA) 108 (90 Base) MCG/ACT inhaler Inhale 2 puffs into the lungs every 6 (six) hours as needed for wheezing or shortness of breath. 18 g 1  . allopurinol (ZYLOPRIM) 100 MG tablet Take 1 tablet (100 mg total) by mouth 2 (two) times daily. 60 tablet 12  . Ascorbic Acid (VITAMIN C) 250 MG CHEW Chew 1 tablet by mouth daily.    Marland Kitchen aspirin EC 81 MG tablet Take 81 mg by mouth daily.    . carvedilol (COREG) 12.5 MG tablet TAKE 1 TABLET BY MOUTH TWICE A DAY WITH MEALS 90 tablet 1  . cholecalciferol (VITAMIN D) 1000 units tablet Take 1,000 Units by mouth daily.    . clopidogrel (PLAVIX) 75 MG tablet TAKE 1 TABLET BY MOUTH DAILY WITH BREAKFAST. 90 tablet 2  . colchicine 0.6 MG tablet Take 1 tablet (0.6 mg total) by mouth daily. As needed for gout flare 60 tablet 12  . escitalopram (LEXAPRO) 20 MG tablet TAKE 1 TABLET BY MOUTH EVERY DAY 90 tablet 2  . ferrous sulfate 325 (65 FE) MG tablet Take 1 tablet (325 mg total) by mouth 2 (two) times daily with a meal. Reported on 08/21/2015 60 tablet 2  . Insulin Human (INSULIN PUMP) SOLN Inject into the skin.    Marland Kitchen insulin NPH Human (HUMULIN N,NOVOLIN N) 100 UNIT/ML injection Inject into the skin daily as needed. Inject 10 units in the morning when blood sugar is 100-199. Inject 15 units in the morning if blood sugar is >200.    Marland Kitchen insulin regular (NOVOLIN R,HUMULIN R) 100 units/mL injection Inject 5-15 Units into the skin See admin instructions. With OMNIPOD Sliding scale 80-150 5 units, 150-200 7 units, 201-250 10 units, 251-300 12 units, 301-400 15 units.    Marland Kitchen levothyroxine (SYNTHROID, LEVOTHROID) 50 MCG tablet Take 1 tablet (50 mcg total) by mouth daily before breakfast. 90 tablet 1  . magnesium oxide (MAG-OX) 400 MG tablet TAKE 1 TABLET BY MOUTH EVERY DAY 90 tablet 3  . nitroGLYCERIN (NITROSTAT) 0.4 MG SL tablet PLACE 1 TABLET (0.4 MG TOTAL) UNDER THE TONGUE EVERY 5 (FIVE) MINUTES AS  NEEDED FOR CHEST PAIN. 25 tablet 1  . nystatin cream (MYCOSTATIN) Apply 1 application topically 2 (two) times daily. (Patient taking differently: Apply 1 application topically 2 (two) times daily as needed (yeast/irritation.). ) 60 g 2  . ONETOUCH VERIO test strip     . pregabalin (LYRICA) 150 MG capsule Take 1 capsule (150 mg total) by mouth 2 (two) times daily. [place]  capsule 2  . rosuvastatin (CRESTOR) 20 MG tablet TAKE 1 TABLET BY MOUTH EVERY DAY 90 tablet 3  . torsemide (DEMADEX) 20 MG tablet Take 3 tablets (60 mg total) by mouth every morning AND 3 tablets (60 mg total) every evening. 540 tablet 3  . traZODone (DESYREL) 100 MG tablet Take 1 tablet (100 mg total) by mouth at bedtime as needed for sleep. 90 tablet 1  . vitamin E (VITAMIN E) 1000 UNIT capsule Take 1,000 Units by mouth daily.     No current facility-administered medications for this encounter.     Vitals:   11/22/18 1505  BP: (!) 184/122  Pulse: 94  SpO2: 95%  Weight: 122.6 kg (270 lb 3.2 oz)   Wt Readings from Last 3 Encounters:  11/22/18 122.6 kg (270 lb 3.2 oz)  10/19/18 118 kg (260 lb 3.2 oz)  10/12/18 115.2 kg (254 lb)    PHYSICAL EXAM: General: Appears chronically ill.  No resp difficulty HEENT: normal Neck: supple. JVP elevated. Carotids 2+ bilat; no bruits. No lymphadenopathy or thryomegaly appreciated. Cor: PMI nondisplaced. Regular rate & rhythm. No rubs, gallops or murmurs. Lungs: clear Abdomen: soft, nontender, nondistended. No hepatosplenomegaly. No bruits or masses. Good bowel sounds. Extremities: no cyanosis, clubbing, rash, R and LLE 1+ edema Neuro: alert & orientedx3, cranial nerves grossly intact. moves all 4 extremities w/o difficulty. Affect pleasant  ASSESSMENT & PLAN: 1. Chronic Diastolic Heart Failure: NICM, Echo 06/2017: EF 55-60%, grade 1 DD - Echo 03/19/18: EF 55-60%, grade 1 DD - NYHA III. Volume status elevated. Increase torsemide to 80 mg twice a day. I have asked asked her to metolazone  x1. - No spiro with creatinine >2.0 -Check BMEt today and in 7 days.   2.  Uncontrolled DM:  - Follows closely with Dr Bubba Camp. Has an insulin pump.   3. CAD, angina No chest pain.  - Continue statin and asa - Patient has a history of CAD with BMS in 2007.   - She had a low risk stress test 01/2017 - Continue Coreg 12.5 mg BID.   4. Obesity: - Body mass index is 43.61 kg/m.  - Discussed portion control.   6. HTN Elevated. Diurese as above hopefull this will help.   7. CKD Stage III - Followed by Dr Hollie Salk. She has follow up next month.   8. Day time fatigue - Refuses sleep study. No change.   9.  s/p right transmetatarsal amputation - Followed by Dr Sharol Given.   Follow up in 7 days for BMEt and in 3 weeks to reassess volume status. Difficult to manage due to ongoing high sodium/high fat diet.   Darrick Grinder, NP 3:25 PM

## 2018-11-22 NOTE — Patient Instructions (Signed)
INCREASE Torsemide to 80 mg (4 tabs) twice a day START Metolazone 2.5 mg one tab today, then only as directed by the CHF clinic   Labs today We will only contact you if something comes back abnormal or we need to make some changes. Otherwise no news is good news!  Labs needed in 7-10 days  Your physician recommends that you schedule a follow-up appointment in: 3 weeks with [name],NP  Do the following things EVERYDAY: 1) Weigh yourself in the morning before breakfast. Write it down and keep it in a log. 2) Take your medicines as prescribed 3) Eat low salt foods-Limit salt (sodium) to 2000 mg per day.  4) Stay as active as you can everyday 5) Limit all fluids for the day to less than 2 liters  At the [facility], you and your health needs are our priority. As part of our continuing mission to provide you with exceptional heart care, we have created designated Provider Care Teams. These Care Teams include your primary Cardiologist (physician) and Advanced Practice Providers (APPs- Physician Assistants and Nurse Practitioners) who all work together to provide you with the care you need, when you need it.   You may see any of the following providers on your designated Care Team at your next follow up: Marland Kitchen Dr Glori Bickers . Dr Loralie Champagne . Darrick Grinder, NP

## 2018-11-23 ENCOUNTER — Ambulatory Visit: Payer: Self-pay | Admitting: Physician Assistant

## 2018-11-23 ENCOUNTER — Ambulatory Visit (INDEPENDENT_AMBULATORY_CARE_PROVIDER_SITE_OTHER): Payer: PPO | Admitting: Orthopedic Surgery

## 2018-11-23 ENCOUNTER — Encounter: Payer: Self-pay | Admitting: Family

## 2018-11-23 DIAGNOSIS — M65332 Trigger finger, left middle finger: Secondary | ICD-10-CM

## 2018-11-23 DIAGNOSIS — B351 Tinea unguium: Secondary | ICD-10-CM

## 2018-11-23 DIAGNOSIS — M1712 Unilateral primary osteoarthritis, left knee: Secondary | ICD-10-CM | POA: Diagnosis not present

## 2018-11-23 DIAGNOSIS — I872 Venous insufficiency (chronic) (peripheral): Secondary | ICD-10-CM | POA: Diagnosis not present

## 2018-11-23 NOTE — Progress Notes (Signed)
Office Visit Note   Patient: Susan [name]           Date of Birth: Jul 13, 1950           MRN: [id number] Visit Date: 11/23/2018              Requested by: Alycia Rossetti, MD [address] [place],  [place] [address] PCP: Alycia Rossetti, MD  No chief complaint on file.     HPI: Patient is a 68 year old woman who presents in follow-up for osteoarthritis of her left knee as well as triggering of the left long finger.  Patient states she has difficulty with balance due to the arthritis in her knees.  She also has venous insufficiency she is wearing compression stockings.  Assessment & Plan: Visit Diagnoses:  1. Trigger finger, left middle finger   2. Venous insufficiency (chronic) (peripheral)   3. Onychomycosis     Plan: Recommended improvement of her A1c glucose control discussed that we should get this below 7.5 to proceed with total knee arthroplasty and discussed that we also need to get her BMI longer than 48.  Patient will work on strengthening weight loss and plan to follow-up in the office in 2 months.  Follow-Up Instructions: Return in about 3 months (around 02/23/2019).   Ortho Exam  Patient is alert, oriented, no adenopathy, well-dressed, normal affect, normal respiratory effort. Patient uses a cane she has difficulty getting from a sitting to a standing position and has to rock herself to get up.  Current BMI is 45.  Current A1c is 8.3 it was 11.4 3 months ago.  Examination she has tricompartmental arthritis of the left knee.  She has triggering of the left long finger at the A1 pulley.  She is crepitation with range of motion of her knee there is no redness no cellulitis no signs of infection she is still on her gout medication.  Imaging: No results found. No images are attached to the encounter.  Labs: Lab Results  Component Value Date   HGBA1C 8.3 (H) 04/30/2018   HGBA1C 9.3 (H) 09/25/2017   HGBA1C 11.4 (H) 07/16/2017   CRP 8.4 (H) 03/05/2018   LABURIC 9.1 (H) 10/12/2018   LABURIC 9.0 (H) 03/06/2018   REPTSTATUS 03/21/2018 FINAL 03/18/2018   CULT (A) 03/18/2018    20,000 COLONIES/mL KLEBSIELLA PNEUMONIAE 20,000 COLONIES/mL ENTEROCOCCUS FAECALIS    LABORGA KLEBSIELLA PNEUMONIAE (A) 03/18/2018   LABORGA ENTEROCOCCUS FAECALIS (A) 03/18/2018     Lab Results  Component Value Date   ALBUMIN 3.0 (L) 03/19/2018   ALBUMIN 3.8 03/18/2018   ALBUMIN 2.6 (L) 02/14/2018   LABURIC 9.1 (H) 10/12/2018   LABURIC 9.0 (H) 03/06/2018    Lab Results  Component Value Date   MG 1.6 02/27/2017   MG 2.1 07/10/2015   MG 1.7 09/21/2013   No results found for: VD25OH  No results found for: PREALBUMIN CBC EXTENDED Latest Ref Rng & Units 04/30/2018 03/20/2018 03/19/2018  WBC 4.0 - 10.5 K/uL 6.0 5.4 6.2  RBC 3.87 - 5.11 MIL/uL 4.37 4.95 4.94  HGB 12.0 - 15.0 g/dL 10.6(L) 11.1(L) 11.3(L)  HCT 36.0 - 46.0 % 35.3(L) 37.8 36.7  PLT 150 - 400 K/uL 226 157 197  NEUTROABS 1.7 - 7.7 K/uL - - -  LYMPHSABS 0.7 - 4.0 K/uL - - -     There is no height or weight on file to calculate BMI.  Orders:  No orders of the defined types were placed in  this encounter.  No orders of the defined types were placed in this encounter.    Procedures: No procedures performed  Clinical Data: No additional findings.  ROS:  All other systems negative, except as noted in the HPI. Review of Systems  Objective: Vital Signs: There were no vitals taken for this visit.  Specialty Comments:  No specialty comments available.  PMFS History: Patient Active Problem List   Diagnosis Date Noted  . Onychomycosis 06/21/2018  . Pressure injury of skin 03/19/2018  . Syncope 03/18/2018  . Osteomyelitis of second toe of right foot ([place])   . Toe osteomyelitis, right ([place])   . 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 ([place])   . Acute on chronic diastolic (congestive) heart failure ([place]) 02/13/2018  . PVD (peripheral vascular disease) ([place]) 10/26/2017   . Hypothyroid 07/27/2017  . PAH (pulmonary artery hypertension) ([place])   . Impaired ambulation 07/19/2017  . Hyperosmolar non-ketotic state in patient with type 2 diabetes mellitus ([place]) 07/15/2017  . Leg cramps 02/27/2017  . Peripheral edema 01/12/2017  . Diabetic neuropathy ([place]) 11/12/2016  . 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 mellitus ([place]) 10/24/2015  . Anemia 10/03/2015  . Generalized anxiety disorder 10/03/2015  . Insomnia 10/03/2015  . Diabetes mellitus type II, uncontrolled ([place]) 06/07/2015  . Chronic diastolic CHF (congestive heart failure) ([place]) 06/07/2015  . Non compliance with medical treatment 04/17/2014  . Rotator cuff tear 03/14/2014  . Morbid obesity ([place]) 09/23/2013  . Hypotension 12/25/2012  . Urinary incontinence   . MDD (major depressive disorder) 11/12/2010  . RBBB (right bundle branch block)   . CAD (coronary artery disease)   . Hyperlipemia 01/22/2009  . Essential hypertension 01/22/2009   Past Medical History:  Diagnosis Date  . Acute MI ([place]) 1999; 2007  . Anemia    hx  . Anginal pain ([place])   . Anxiety   . ARF (acute renal failure) ([place]) 06/2017   [facility]  . Arthritis    "generalized" (03/15/2014)  . CAD (coronary artery disease)    MI in 2000 - MI  2007 - treated bare metal stent (no nuclear since then as 9/11)  . Carotid artery disease ([place])   . CHF (congestive heart failure) ([place])   . Chronic diastolic heart failure (HCC)    a) ECHO (08/2013) EF 55-60% and RV function nl b) RHC (08/2013) RA 4, RV 30/5/7, PA 25/10 (16), PCWP 7, Fick CO/CI 6.3/2.7, PVR 1.5 WU, PA 61 and 66%  . Daily headache    "~ every other day; since I fell in June" (03/15/2014)  . Depression   . Dyslipidemia   . Dyspnea   . Exertional shortness of breath   . HTN (hypertension)   . Hypothyroidism   . Neuropathy   . Obesity   . Osteoarthritis   . Peripheral neuropathy   . PONV (postoperative nausea and vomiting)   . RBBB (right bundle branch block)    Old   . Stroke [facility])    mini strokes  . Syncope    likely due to low blood sugar  . Tachycardia    Sinus tachycardia  . Type II diabetes mellitus (HCC)    Type II  . Urinary incontinence   . Venous insufficiency     Family History  Problem Relation Age of Onset  . Heart attack Mother 41    Past Surgical History:  Procedure Laterality Date  . ABDOMINAL HYSTERECTOMY  1980's  . AMPUTATION Right  02/24/2018   Procedure: RIGHT FOOT GREAT TOE AND 2ND TOE AMPUTATION;  Surgeon: Newt Minion, MD;  Location: [facility];  Service: Orthopedics;  Laterality: Right;  . AMPUTATION Right 04/30/2018   Procedure: RIGHT TRANSMETATARSAL AMPUTATION;  Surgeon: Newt Minion, MD;  Location: [facility];  Service: Orthopedics;  Laterality: Right;  . CATARACT EXTRACTION, BILATERAL Bilateral ?2013  . COLONOSCOPY W/ POLYPECTOMY    . CORONARY ANGIOPLASTY WITH STENT PLACEMENT  1999; 2007   "1 + 1"  . EYE SURGERY Bilateral    [facility]  . KNEE ARTHROSCOPY Left 10/25/2006  . RIGHT HEART CATH N/A 07/24/2017   Procedure: RIGHT HEART CATH;  Surgeon: Jolaine Artist, MD;  Location: [facility] CV LAB;  Service: Cardiovascular;  Laterality: N/A;  . RIGHT HEART CATHETERIZATION N/A 09/22/2013   Procedure: RIGHT HEART CATH;  Surgeon: Jolaine Artist, MD;  Location: [facility] CATH LAB;  Service: Cardiovascular;  Laterality: N/A;  . SHOULDER ARTHROSCOPY WITH OPEN ROTATOR CUFF REPAIR Right 03/14/2014   Procedure: RIGHT SHOULDER ARTHROSCOPY WITH BICEPS RELEASE, OPEN SUBSCAPULA REPAIR, OPEN SUPRASPINATUS REPAIR.;  Surgeon: Meredith Pel, MD;  Location: [facility];  Service: Orthopedics;  Laterality: Right;  . TOE AMPUTATION Right 02/24/2018   GREAT TOE AND 2ND TOE AMPUTATION  . TUBAL LIGATION  1970's   Social History   Occupational History    Employer: UNEMPLOYED  Tobacco Use  . Smoking status: Former Smoker    Packs/day: 3.00    Years: 32.00    Pack years: 96.00    Types: Cigarettes    Quit date: 10/24/1997    Years since quitting:  21.0  . Smokeless tobacco: Never Used  Substance and Sexual Activity  . Alcohol use: Not Currently    Comment: "might have 2-3 daiquiris in the summer"  . Drug use: No  . Sexual activity: Never

## 2018-11-25 ENCOUNTER — Telehealth (HOSPITAL_COMMUNITY): Payer: Self-pay | Admitting: Adult Health

## 2018-11-25 ENCOUNTER — Encounter: Payer: Self-pay | Admitting: Orthopedic Surgery

## 2018-11-25 NOTE — Telephone Encounter (Signed)
COVID screening completed. ° °-[facility] °

## 2018-11-29 ENCOUNTER — Other Ambulatory Visit: Payer: Self-pay

## 2018-11-29 ENCOUNTER — Ambulatory Visit (HOSPITAL_COMMUNITY)
Admission: RE | Admit: 2018-11-29 | Discharge: 2018-11-29 | Disposition: A | Discharge status: Home / Self Care | Payer: PPO | Source: Ambulatory Visit | Attending: Cardiology | Admitting: Cardiology

## 2018-11-29 DIAGNOSIS — I5032 Chronic diastolic (congestive) heart failure: Secondary | ICD-10-CM | POA: Insufficient documentation

## 2018-11-29 LAB — BASIC METABOLIC PANEL
Anion gap: 7 (ref 5–15)
BUN: 64 mg/dL — ABNORMAL HIGH (ref 8–23)
CO2: 24 mmol/L (ref 22–32)
Calcium: 8.8 mg/dL — ABNORMAL LOW (ref 8.9–10.3)
Chloride: 104 mmol/L (ref 98–111)
Creatinine, Ser: 2.24 mg/dL — ABNORMAL HIGH (ref 0.44–1.00)
GFR calc Af Amer: 25 mL/min — ABNORMAL LOW (ref >60)
GFR calc non Af Amer: 22 mL/min — ABNORMAL LOW (ref >60)
Glucose, Bld: 295 mg/dL — ABNORMAL HIGH (ref 70–99)
Potassium: 5 mmol/L (ref 3.5–5.1)
Sodium: 135 mmol/L (ref 135–145)

## 2018-12-09 DIAGNOSIS — I509 Heart failure, unspecified: Secondary | ICD-10-CM | POA: Diagnosis not present

## 2018-12-09 DIAGNOSIS — E1122 Type 2 diabetes mellitus with diabetic chronic kidney disease: Secondary | ICD-10-CM | POA: Diagnosis not present

## 2018-12-09 DIAGNOSIS — N390 Urinary tract infection, site not specified: Secondary | ICD-10-CM | POA: Diagnosis not present

## 2018-12-09 DIAGNOSIS — N189 Chronic kidney disease, unspecified: Secondary | ICD-10-CM | POA: Diagnosis not present

## 2018-12-09 DIAGNOSIS — N184 Chronic kidney disease, stage 4 (severe): Secondary | ICD-10-CM | POA: Diagnosis not present

## 2018-12-09 DIAGNOSIS — D631 Anemia in chronic kidney disease: Secondary | ICD-10-CM | POA: Diagnosis not present

## 2018-12-09 DIAGNOSIS — I129 Hypertensive chronic kidney disease with stage 1 through stage 4 chronic kidney disease, or unspecified chronic kidney disease: Secondary | ICD-10-CM | POA: Diagnosis not present

## 2018-12-12 NOTE — Progress Notes (Signed)
fPatient ID: Susan [name], female   DOB: 1951/05/13, 68 y.o.   MRN: [id number]   PCP: Dr Scot Dock  Cardiologist: Dr Ron Parker  Endocrinologist: Dr Bubba Camp  Primary HF Cardiologist: Dr. Haroldine Laws  Nephrology: Dr Hollie Salk  [facility] Patient  HPI: Susan [name] is a 68 y.o. female with a history of RBBB, DM, Hypothyroid, diastolic heart failure, CAD MI 2000/2007 BMS 2007, TIA, obesity, CKD Stage IV,  R transmetatarsal amputation. .    Admitted June 1st through June 3rd, 2017 with altered mental status, hypothermia, and weakness. Diuretics initially held and later restarted.   Admitted 2/27-07/28/17 for AKI. Torsemide was held and restarted at 60 mg BID. She had RHC 07/24/17 that showed mild PAH with evidence of RV strain likely due to OHS/OSA. She was discharged to SNF.  Admitted 07/6466 with A/C diastolic HF. Diuresed with IV lasix.   Admitted 10/24-10/27/19 with syncope. She was orthostatic. Had AKI with creatinine up to 3.33 and required IVF. Renal US showed no hydronephrosis. Losartan and imdur were DC'd. Creatinine 1.76 on day of DC.   Today she returns for HF follow up. Overall feeling fine. Remains SOB with exertion. Denies PND/Orthopnea. Continues to eat take out.  Appetite ok. No fever or chills. Weight at home has been going up. Taking all medications but did not take medications this morning.   ECHO 07/11/2015- 50-55%.  Grade I DD Echo 07/23/17: EF 55-60%, grade 1 DD Echo 03/19/18: EF 55-60%, grade 1 DD  RHC 07/24/17: RA = 17 RV = 48/15 PA = 49/11 (30) PCW = 18 Fick cardiac output/index = 7.0/3.0 PVR = 0.5 WU Ao sat = 97% PA sat = 62%, 64% 1. Normal left-sided pressures 2. Mild PAH with evidence of RV strain likely due to OHS/OSA 3. Normal cardiac output  RHC 09/22/13: RA = 4  RV = 30/5/7  PA = 25/10 (16)  PCW = 7  Fick cardiac output/index = 6.3/2.7  PVR = 1.5 WU   FA sat = 93%  PA sat = 61%, 66%   ABI 11/04/2017 Right: The right toe-brachial index is normal. RT great toe pressure =  127 mmHg. Although ankle brachial indices are within normal limits (0.95-1.29), arterial Doppler waveforms at the ankle suggest some component of arterial occlusive disease. Left: The left toe-brachial index is abnormal. LT Great toe pressure = 115 mmHg. Although ankle brachial indices are within normal limits (0.95-1.29), arterial Doppler waveforms at the ankle suggest some component of arterial occlusive disease.  SH: Former smoker quit 15 year ago. Does drink alcohol. She is disabled. Lives at home with her husband and disabled son - Husband is truck Geophysicist/field seismologist. Son has a brain injury after he attempted suicide  A few years ago. .    FH: Mom MI  Review of systems complete and found to be negative unless listed in HPI.   Past Medical History:  Diagnosis Date  . Acute MI ([place]) 1999; 2007  . Anemia    hx  . Anginal pain ([place])   . Anxiety   . ARF (acute renal failure) ([place]) 06/2017   [facility]  . Arthritis    "generalized" (03/15/2014)  . CAD (coronary artery disease)    MI in 2000 - MI  2007 - treated bare metal stent (no nuclear since then as 9/11)  . Carotid artery disease ([place])   . CHF (congestive heart failure) ([place])   . Chronic diastolic heart failure ([place])    a) ECHO (08/2013) EF 55-60% and RV function  nl b) RHC (08/2013) RA 4, RV 30/5/7, PA 25/10 (16), PCWP 7, Fick CO/CI 6.3/2.7, PVR 1.5 WU, PA 61 and 66%  . Daily headache    "~ every other day; since I fell in June" (03/15/2014)  . Depression   . Dyslipidemia   . Dyspnea   . Exertional shortness of breath   . HTN (hypertension)   . Hypothyroidism   . Neuropathy   . Obesity   . Osteoarthritis   . Peripheral neuropathy   . PONV (postoperative nausea and vomiting)   . RBBB (right bundle branch block)    Old  . Stroke [facility])    mini strokes  . Syncope    likely due to low blood sugar  . Tachycardia    Sinus tachycardia  . Type II diabetes mellitus (HCC)    Type II  . Urinary incontinence   . Venous  insufficiency     Current Outpatient Medications  Medication Sig Dispense Refill  . albuterol (VENTOLIN HFA) 108 (90 Base) MCG/ACT inhaler Inhale 2 puffs into the lungs every 6 (six) hours as needed for wheezing or shortness of breath. 18 g 1  . allopurinol (ZYLOPRIM) 100 MG tablet Take 1 tablet (100 mg total) by mouth 2 (two) times daily. 60 tablet 12  . Ascorbic Acid (VITAMIN C) 250 MG CHEW Chew 1 tablet by mouth daily.    Marland Kitchen aspirin EC 81 MG tablet Take 81 mg by mouth daily.    . carvedilol (COREG) 12.5 MG tablet TAKE 1 TABLET BY MOUTH TWICE A DAY WITH MEALS 90 tablet 1  . cholecalciferol (VITAMIN D) 1000 units tablet Take 1,000 Units by mouth daily.    . clopidogrel (PLAVIX) 75 MG tablet TAKE 1 TABLET BY MOUTH DAILY WITH BREAKFAST. 90 tablet 2  . colchicine 0.6 MG tablet Take 1 tablet (0.6 mg total) by mouth daily. As needed for gout flare 60 tablet 12  . escitalopram (LEXAPRO) 20 MG tablet TAKE 1 TABLET BY MOUTH EVERY DAY 90 tablet 2  . ferrous sulfate 325 (65 FE) MG tablet Take 1 tablet (325 mg total) by mouth 2 (two) times daily with a meal. Reported on 08/21/2015 60 tablet 2  . Insulin Human (INSULIN PUMP) SOLN Inject into the skin.    Marland Kitchen insulin NPH Human (HUMULIN N,NOVOLIN N) 100 UNIT/ML injection Inject into the skin daily as needed. Inject 10 units in the morning when blood sugar is 100-199. Inject 15 units in the morning if blood sugar is >200.    Marland Kitchen insulin regular (NOVOLIN R,HUMULIN R) 100 units/mL injection Inject 5-15 Units into the skin See admin instructions. With OMNIPOD Sliding scale 80-150 5 units, 150-200 7 units, 201-250 10 units, 251-300 12 units, 301-400 15 units.    Marland Kitchen levothyroxine (SYNTHROID, LEVOTHROID) 50 MCG tablet Take 1 tablet (50 mcg total) by mouth daily before breakfast. 90 tablet 1  . magnesium oxide (MAG-OX) 400 MG tablet TAKE 1 TABLET BY MOUTH EVERY DAY 90 tablet 3  . metolazone (ZAROXOLYN) 2.5 MG tablet Take 1 tablet (2.5 mg total) by mouth as directed. By the  CHF clinic 5 tablet 0  . nitroGLYCERIN (NITROSTAT) 0.4 MG SL tablet PLACE 1 TABLET (0.4 MG TOTAL) UNDER THE TONGUE EVERY 5 (FIVE) MINUTES AS NEEDED FOR CHEST PAIN. 25 tablet 1  . nystatin cream (MYCOSTATIN) Apply 1 application topically 2 (two) times daily. (Patient taking differently: Apply 1 application topically 2 (two) times daily as needed (yeast/irritation.). ) 60 g 2  . ONETOUCH VERIO  test strip     . pregabalin (LYRICA) 150 MG capsule Take 1 capsule (150 mg total) by mouth 2 (two) times daily. 60 capsule 2  . rosuvastatin (CRESTOR) 20 MG tablet TAKE 1 TABLET BY MOUTH EVERY DAY 90 tablet 3  . torsemide (DEMADEX) 20 MG tablet Take 4 tablets (80 mg total) by mouth 2 (two) times daily. 720 tablet 3  . traZODone (DESYREL) 100 MG tablet Take 1 tablet (100 mg total) by mouth at bedtime as needed for sleep. 90 tablet 1  . vitamin E (VITAMIN E) 1000 UNIT capsule Take 1,000 Units by mouth daily.     No current facility-administered medications for this encounter.     Vitals:   12/13/18 1138  BP: (!) 144/98  Pulse: 94  SpO2: 94%  Weight: 123.5 kg (272 lb 3.2 oz)   Wt Readings from Last 3 Encounters:  12/13/18 123.5 kg (272 lb 3.2 oz)  11/22/18 122.6 kg (270 lb 3.2 oz)  10/19/18 118 kg (260 lb 3.2 oz)    PHYSICAL EXAM: General:   No resp difficulty HEENT: normal Neck: supple. JVP ~10 . Carotids 2+ bilat; no bruits. No lymphadenopathy or thryomegaly appreciated. Cor: PMI nondisplaced. Regular rate & rhythm. No rubs, gallops or murmurs. Lungs: clear Abdomen: soft, nontender, nondistended. No hepatosplenomegaly. No bruits or masses. Good bowel sounds. Extremities: no cyanosis, clubbing, rash, R and LLE 1+ edema Neuro: alert & orientedx3, cranial nerves grossly intact. moves all 4 extremities w/o difficulty. Affect pleasant  ASSESSMENT & PLAN: 1. Chronic Diastolic Heart Failure: NICM, Echo 06/2017: EF 55-60%, grade 1 DD - Echo 03/19/18: EF 55-60%, grade 1 DD - NYHA III. Volume status  elevated. Difficult to manage due to high sodium diet.  -Continue torsemide to 80 mg twice a day. Increase metolazone to twice a week.  - No spiro with creatinine >2.0  2.  Uncontrolled DM:  - Follows closely with Dr Bubba Camp. Has an insulin pump.   3. CAD, angina No chest pain.   - Continue statin and asa - Patient has a history of CAD with BMS in 2007.   - She had a low risk stress test 01/2017 - Continue Coreg 12.5 mg BID.   4. Obesity: - Body mass index is 43.93 kg/m.  - Discussed portion control. Referred to weight loss clinic.   6. HTN  7. CKD Stage III - Followed by Dr Hollie Salk.   8. Day time fatigue - Refuses sleep study. No change.   9.  s/p right transmetatarsal amputation - Followed by Dr Sharol Given.    Follow up 4-6 weeks.   Darrick Grinder, NP 11:52 AM

## 2018-12-13 ENCOUNTER — Encounter (HOSPITAL_COMMUNITY): Payer: Self-pay

## 2018-12-13 ENCOUNTER — Ambulatory Visit (HOSPITAL_COMMUNITY)
Admission: RE | Admit: 2018-12-13 | Discharge: 2018-12-13 | Disposition: A | Discharge status: Home / Self Care | Payer: PPO | Source: Ambulatory Visit | Attending: Cardiology | Admitting: Cardiology

## 2018-12-13 ENCOUNTER — Other Ambulatory Visit: Payer: Self-pay

## 2018-12-13 VITALS — BP 144/98 | HR 94 | Wt 272.2 lb

## 2018-12-13 DIAGNOSIS — I4510 Unspecified right bundle-branch block: Secondary | ICD-10-CM | POA: Insufficient documentation

## 2018-12-13 DIAGNOSIS — I25119 Atherosclerotic heart disease of native coronary artery with unspecified angina pectoris: Secondary | ICD-10-CM | POA: Diagnosis not present

## 2018-12-13 DIAGNOSIS — R0602 Shortness of breath: Secondary | ICD-10-CM | POA: Insufficient documentation

## 2018-12-13 DIAGNOSIS — E1122 Type 2 diabetes mellitus with diabetic chronic kidney disease: Secondary | ICD-10-CM | POA: Diagnosis not present

## 2018-12-13 DIAGNOSIS — Z79899 Other long term (current) drug therapy: Secondary | ICD-10-CM | POA: Diagnosis not present

## 2018-12-13 DIAGNOSIS — Z794 Long term (current) use of insulin: Secondary | ICD-10-CM | POA: Insufficient documentation

## 2018-12-13 DIAGNOSIS — N184 Chronic kidney disease, stage 4 (severe): Secondary | ICD-10-CM

## 2018-12-13 DIAGNOSIS — I2510 Atherosclerotic heart disease of native coronary artery without angina pectoris: Secondary | ICD-10-CM

## 2018-12-13 DIAGNOSIS — Z7982 Long term (current) use of aspirin: Secondary | ICD-10-CM | POA: Insufficient documentation

## 2018-12-13 DIAGNOSIS — I252 Old myocardial infarction: Secondary | ICD-10-CM | POA: Insufficient documentation

## 2018-12-13 DIAGNOSIS — I130 Hypertensive heart and chronic kidney disease with heart failure and stage 1 through stage 4 chronic kidney disease, or unspecified chronic kidney disease: Secondary | ICD-10-CM | POA: Diagnosis not present

## 2018-12-13 DIAGNOSIS — I428 Other cardiomyopathies: Secondary | ICD-10-CM | POA: Diagnosis not present

## 2018-12-13 DIAGNOSIS — F329 Major depressive disorder, single episode, unspecified: Secondary | ICD-10-CM | POA: Insufficient documentation

## 2018-12-13 DIAGNOSIS — I5032 Chronic diastolic (congestive) heart failure: Secondary | ICD-10-CM | POA: Diagnosis not present

## 2018-12-13 DIAGNOSIS — Z9641 Presence of insulin pump (external) (internal): Secondary | ICD-10-CM | POA: Diagnosis not present

## 2018-12-13 DIAGNOSIS — Z8673 Personal history of transient ischemic attack (TIA), and cerebral infarction without residual deficits: Secondary | ICD-10-CM | POA: Insufficient documentation

## 2018-12-13 DIAGNOSIS — Z8249 Family history of ischemic heart disease and other diseases of the circulatory system: Secondary | ICD-10-CM | POA: Diagnosis not present

## 2018-12-13 DIAGNOSIS — E1165 Type 2 diabetes mellitus with hyperglycemia: Secondary | ICD-10-CM | POA: Diagnosis not present

## 2018-12-13 DIAGNOSIS — N183 Chronic kidney disease, stage 3 (moderate): Secondary | ICD-10-CM | POA: Diagnosis not present

## 2018-12-13 DIAGNOSIS — E039 Hypothyroidism, unspecified: Secondary | ICD-10-CM | POA: Insufficient documentation

## 2018-12-13 DIAGNOSIS — G629 Polyneuropathy, unspecified: Secondary | ICD-10-CM | POA: Insufficient documentation

## 2018-12-13 DIAGNOSIS — F419 Anxiety disorder, unspecified: Secondary | ICD-10-CM | POA: Insufficient documentation

## 2018-12-13 DIAGNOSIS — E669 Obesity, unspecified: Secondary | ICD-10-CM | POA: Insufficient documentation

## 2018-12-13 DIAGNOSIS — Z6841 Body Mass Index (BMI) 40.0 and over, adult: Secondary | ICD-10-CM | POA: Insufficient documentation

## 2018-12-13 DIAGNOSIS — Z87891 Personal history of nicotine dependence: Secondary | ICD-10-CM | POA: Diagnosis not present

## 2018-12-13 DIAGNOSIS — I872 Venous insufficiency (chronic) (peripheral): Secondary | ICD-10-CM | POA: Diagnosis not present

## 2018-12-13 DIAGNOSIS — E785 Hyperlipidemia, unspecified: Secondary | ICD-10-CM | POA: Insufficient documentation

## 2018-12-13 LAB — BASIC METABOLIC PANEL
Anion gap: 8 (ref 5–15)
BUN: 36 mg/dL — ABNORMAL HIGH (ref 8–23)
CO2: 22 mmol/L (ref 22–32)
Calcium: 8.5 mg/dL — ABNORMAL LOW (ref 8.9–10.3)
Chloride: 108 mmol/L (ref 98–111)
Creatinine, Ser: 1.63 mg/dL — ABNORMAL HIGH (ref 0.44–1.00)
GFR calc Af Amer: 37 mL/min — ABNORMAL LOW (ref >60)
GFR calc non Af Amer: 32 mL/min — ABNORMAL LOW (ref >60)
Glucose, Bld: 341 mg/dL — ABNORMAL HIGH (ref 70–99)
Potassium: 4.4 mmol/L (ref 3.5–5.1)
Sodium: 138 mmol/L (ref 135–145)

## 2018-12-13 MED ORDER — METOLAZONE 2.5 MG PO TABS: 2.5000 mg | ORAL_TABLET | ORAL | 6 refills | Status: DC

## 2018-12-13 NOTE — Addendum Note (Signed)
Encounter addended by: Valeda Malm, RN on: 12/13/2018 12:16 PM  Actions taken: Clinical Note Signed

## 2018-12-13 NOTE — Patient Instructions (Addendum)
INCREASE Metolazone to twice a week on Monday and Fridays.  Labs today We will only contact you if something comes back abnormal or we need to make some changes. Otherwise no news is good news!  Your physician recommends that you schedule a follow-up appointment in: 4 weeks with Nurse Practitioner. You have received an appointment card.  At the [facility], you and your health needs are our priority. As part of our continuing mission to provide you with exceptional heart care, we have created designated Provider Care Teams. These Care Teams include your primary Cardiologist (physician) and Advanced Practice Providers (APPs- Physician Assistants and Nurse Practitioners) who all work together to provide you with the care you need, when you need it.   You may see any of the following providers on your designated Care Team at your next follow up: Marland Kitchen Dr Glori Bickers . Dr Loralie Champagne . Darrick Grinder, NP

## 2018-12-14 ENCOUNTER — Other Ambulatory Visit: Payer: Self-pay | Admitting: Pharmacist

## 2018-12-14 NOTE — Patient Outreach (Addendum)
[place] [facility]) Care Management  12/14/2018  Susan [name] 10-Sep-1950 [id number]   Patient was called to follow up on medication adherence.  HIPAA identifiers were obtained.  She is a 68 yo female with multiple medical conditions including but not limited to:    CHF, CAD, CKD, type 2 diabetes, hypertension, GAD, hyperlipidemia, hypothyroid, insomnia, diabetes, morbid obesity, pulmonary hypertension, PVD, and peripheral neuropathy.  Medications were reviewed:  Medications Reviewed Today    Reviewed by [name], [facility] (Pharmacist) on 12/14/18 at 1452  Med List Status: <None>  Medication Order Taking? Sig Documenting Provider Last Dose Status Informant  albuterol (VENTOLIN HFA) 108 (90 Base) MCG/ACT inhaler [id number] Yes Inhale 2 puffs into the lungs every 6 (six) hours as needed for wheezing or shortness of breath. [place], [name], MD Taking Active   allopurinol (ZYLOPRIM) 100 MG tablet [id number] Yes Take 1 tablet (100 mg total) by mouth 2 (two) times daily. Newt Minion, MD Taking Active   Ascorbic Acid (VITAMIN C) 250 MG CHEW [id number] Yes Chew 1 tablet by mouth daily. [provider] Taking Active   aspirin EC 81 MG tablet [id number] Yes Take 81 mg by mouth daily. [provider] Taking Active Self  carvedilol (COREG) 12.5 MG tablet [id number] Yes TAKE 1 TABLET BY MOUTH TWICE A DAY WITH MEALS [place], [name], MD Taking Active   cholecalciferol (VITAMIN D) 1000 units tablet [id number] Yes Take 1,000 Units by mouth daily. [provider] Taking Active Self  clopidogrel (PLAVIX) 75 MG tablet [id number] Yes TAKE 1 TABLET BY MOUTH DAILY WITH BREAKFAST. Larey Dresser, MD Taking Active   colchicine 0.6 MG tablet [id number] Yes Take 1 tablet (0.6 mg total) by mouth daily. As needed for gout flare Newt Minion, MD Taking Active   escitalopram (LEXAPRO) 20 MG tablet [id number] Yes TAKE 1 TABLET BY MOUTH EVERY DAY [place], [name], MD Taking Active    ferrous sulfate 325 (65 FE) MG tablet [id number] Yes Take 1 tablet (325 mg total) by mouth 2 (two) times daily with a meal. Reported on 08/21/2015 Alycia Rossetti, MD Taking Active   Insulin Disposable Pump (OMNIPOD DASH 5 PACK PODS) MISC [id number] Yes CHANGE EVERY 72 HOURS [provider] Taking Active         Discontinued 71/69/67 8938 (Duplicate)            Med Note (PRUITT, JULIE D   Thu Jun 24, 2018 12:11 PM) Omnipod Insulin Pump System   insulin NPH Human (HUMULIN N,NOVOLIN N) 100 UNIT/ML injection [id number] Yes Inject into the skin daily as needed. Inject 10 units in the morning when blood sugar is 100-199. Inject 15 units in the morning if blood sugar is >200. [provider] Taking Active Self  insulin regular (NOVOLIN R,HUMULIN R) 100 units/mL injection [id number] Yes Inject 5-15 Units into the skin See admin instructions. With OMNIPOD Sliding scale 80-150 5 units, 150-200 7 units, 201-250 10 units, 251-300 12 units, 301-400 15 units. [provider] Taking Active Self           Med Note Birdena Crandall Mar 18, 2018  8:28 PM)    levothyroxine (SYNTHROID, LEVOTHROID) 50 MCG tablet [id number] Yes Take 1 tablet (50 mcg total) by mouth daily before breakfast. [name], PA-C Taking Active Self  magnesium oxide (MAG-OX) 400 MG tablet [id number] Yes TAKE 1 TABLET BY MOUTH EVERY DAY , [name], MD Taking Active   metolazone (ZAROXOLYN) 2.5 MG  tablet [id number] Yes Take 1 tablet (2.5 mg total) by mouth as directed. Monday and Friday Darrick Grinder D, NP Taking Active   nitroGLYCERIN (NITROSTAT) 0.4 MG SL tablet [id number] Yes PLACE 1 TABLET (0.4 MG TOTAL) UNDER THE TONGUE EVERY 5 (FIVE) MINUTES AS NEEDED FOR CHEST PAIN. [name], PA-C Taking Active Self           Med Note Adora Fridge   Tue Jan 13, 2017  2:23 PM)    nystatin cream (MYCOSTATIN) [id number] No Apply 1 application topically 2 (two) times daily.  Patient not taking: Reported on  12/14/2018   Alycia Rossetti, MD Not Taking Active   [facility] VERIO test strip [id number] Yes  [provider] Taking Active Self  pregabalin (LYRICA) 150 MG capsule [id number] Yes Take 1 capsule (150 mg total) by mouth 2 (two) times daily. Alycia Rossetti, MD Taking Active Self  rosuvastatin (CRESTOR) 20 MG tablet [id number] Yes TAKE 1 TABLET BY MOUTH EVERY DAY [place], [name], MD Taking Active Self  torsemide (DEMADEX) 20 MG tablet [id number] Yes Take 4 tablets (80 mg total) by mouth 2 (two) times daily. Darrick Grinder D, NP Taking Active   traZODone (DESYREL) 100 MG tablet [id number] Yes Take 1 tablet (100 mg total) by mouth at bedtime as needed for sleep. [place], [name], MD Taking Active   vitamin E (VITAMIN E) 1000 UNIT capsule [id number] Yes Take 1,000 Units by mouth daily. [provider] Taking Active Self         Patient confirmed she has not been feeling well.  She said she has a lot of fluid around her knees and legs.  Due to fluid, patient said she is not able to ambulate on her own.  She uses a walker.  She reported falling multiple times over the past month. (Last Thursday being the last time).  She did not hit her head.  We spent a great deal of time discussing healthy food choices (decreasing carbs and sodium) as well as sodium restriction.    Patient reported eating [employer] bacon, egg and cheese biscuits for breakfast ( we discussed alternatives).  She said she was going to make meat loaf, peas/carrot mix, and mashed potatoes for dinner.  (We discussed starchy vegetables and "the plate method".)  She is now taking Metolazone on Mondays and Fridays in addition to high dose Torsemide.  (K 4.4)  Patient reported her HgA1c had increased from 8.3 to >11% (from her last Endocrinologist appointment).  Her insulin regimen was reviewed.  Her NPH dose seems a little low but patient admitted to not using the Regular insulin with lunch. The importance of adherence was  reviewed at length.    Patient had a cardiology visit yesterday and will have a follow up on 01/10/2019.  She will see her PCP 01/14/2019.  Plan: Follow up with the patient in 1 week to discuss her diet and review V-Go administration.  [name], PharmD, [place] Clinical Pharmacist [phone]

## 2018-12-18 ENCOUNTER — Other Ambulatory Visit: Payer: Self-pay | Admitting: Family Medicine

## 2018-12-20 ENCOUNTER — Ambulatory Visit: Payer: PPO | Admitting: Family Medicine

## 2018-12-21 ENCOUNTER — Other Ambulatory Visit: Payer: Self-pay | Admitting: Pharmacist

## 2018-12-21 NOTE — Patient Outreach (Signed)
[place] [facility]) Care Management  12/21/2018  Susan [name] 12-25-1950 [id number]   Called patient to follow up. HIPAA identifiers were obtained.  Patient said she was feeling "ok" but "my fluid is still really bad".    Patient said her legs were very swollen and that it is hard for her to wear pants or shorts that go below her knees.  She said she did not weigh today.  She has an appointment with heart and vascular on 01/19/2019 at 11:30am.   Patient reported she did not eat a biscuit today or yesterday.  This was celebrated because she usually has a biscuit every Monday.  Her diuretic regimen was reviewed.  Patient is currently taking torsemide 20 mg 4 tablets twice daily and metolazone 2.5mg  1 tablet on Mondays and Fridays.  Patient stated the metolazone did not make her urinate as much as it usually does yesterday.  She also reported trying limit her fluid intake but she does not consistently weigh.  The importance of daily weighing was discussed. Patient said she would begin to weigh herself daily.  Her insulin regimen was reviewed.  Plan: Call patient back in 1 week.   [name], PharmD, [place] Clinical Pharmacist [phone]

## 2018-12-28 ENCOUNTER — Ambulatory Visit: Payer: Self-pay | Admitting: Pharmacist

## 2018-12-28 ENCOUNTER — Other Ambulatory Visit: Payer: Self-pay | Admitting: Pharmacist

## 2018-12-28 NOTE — Patient Outreach (Signed)
[place] [facility]) Care Management  12/28/2018  Grete Bosko [birth year] [id number]   Patient was called to follow up on medication management. Unfortunately, she did not answer the phone. HIPAA compliant message was left on the patient's voicemail.  Plan: Call patient back in 3-4 weeks.   [name], PharmD, [place] Clinical Pharmacist [phone]

## 2019-01-04 DIAGNOSIS — E113513 Type 2 diabetes mellitus with proliferative diabetic retinopathy with macular edema, bilateral: Secondary | ICD-10-CM | POA: Diagnosis not present

## 2019-01-04 DIAGNOSIS — H43392 Other vitreous opacities, left eye: Secondary | ICD-10-CM | POA: Diagnosis not present

## 2019-01-04 DIAGNOSIS — H43813 Vitreous degeneration, bilateral: Secondary | ICD-10-CM | POA: Diagnosis not present

## 2019-01-04 DIAGNOSIS — H3582 Retinal ischemia: Secondary | ICD-10-CM | POA: Diagnosis not present

## 2019-01-07 ENCOUNTER — Ambulatory Visit: Payer: PPO | Admitting: Family Medicine

## 2019-01-10 ENCOUNTER — Encounter (HOSPITAL_COMMUNITY): Payer: PPO

## 2019-01-10 DIAGNOSIS — I10 Essential (primary) hypertension: Secondary | ICD-10-CM | POA: Diagnosis not present

## 2019-01-10 DIAGNOSIS — E11319 Type 2 diabetes mellitus with unspecified diabetic retinopathy without macular edema: Secondary | ICD-10-CM | POA: Diagnosis not present

## 2019-01-10 DIAGNOSIS — E11621 Type 2 diabetes mellitus with foot ulcer: Secondary | ICD-10-CM | POA: Diagnosis not present

## 2019-01-10 DIAGNOSIS — N189 Chronic kidney disease, unspecified: Secondary | ICD-10-CM | POA: Diagnosis not present

## 2019-01-10 DIAGNOSIS — E039 Hypothyroidism, unspecified: Secondary | ICD-10-CM | POA: Diagnosis not present

## 2019-01-10 DIAGNOSIS — G609 Hereditary and idiopathic neuropathy, unspecified: Secondary | ICD-10-CM | POA: Diagnosis not present

## 2019-01-10 DIAGNOSIS — R809 Proteinuria, unspecified: Secondary | ICD-10-CM | POA: Diagnosis not present

## 2019-01-11 ENCOUNTER — Other Ambulatory Visit: Payer: Self-pay | Admitting: Family Medicine

## 2019-01-14 ENCOUNTER — Ambulatory Visit: Payer: PPO | Admitting: Family Medicine

## 2019-01-14 ENCOUNTER — Other Ambulatory Visit: Payer: Self-pay

## 2019-01-17 ENCOUNTER — Encounter: Payer: Self-pay | Admitting: Family Medicine

## 2019-01-17 ENCOUNTER — Ambulatory Visit (INDEPENDENT_AMBULATORY_CARE_PROVIDER_SITE_OTHER): Payer: PPO | Admitting: Family Medicine

## 2019-01-17 ENCOUNTER — Other Ambulatory Visit: Payer: Self-pay

## 2019-01-17 VITALS — BP 112/64 | HR 82 | Temp 98.6°F | Resp 14 | Ht 66.0 in | Wt 270.0 lb

## 2019-01-17 DIAGNOSIS — E1142 Type 2 diabetes mellitus with diabetic polyneuropathy: Secondary | ICD-10-CM | POA: Diagnosis not present

## 2019-01-17 DIAGNOSIS — F331 Major depressive disorder, recurrent, moderate: Secondary | ICD-10-CM

## 2019-01-17 DIAGNOSIS — N184 Chronic kidney disease, stage 4 (severe): Secondary | ICD-10-CM

## 2019-01-17 DIAGNOSIS — Z23 Encounter for immunization: Secondary | ICD-10-CM | POA: Diagnosis not present

## 2019-01-17 DIAGNOSIS — E1165 Type 2 diabetes mellitus with hyperglycemia: Secondary | ICD-10-CM | POA: Diagnosis not present

## 2019-01-17 DIAGNOSIS — R600 Localized edema: Secondary | ICD-10-CM

## 2019-01-17 DIAGNOSIS — R609 Edema, unspecified: Secondary | ICD-10-CM | POA: Diagnosis not present

## 2019-01-17 DIAGNOSIS — F5101 Primary insomnia: Secondary | ICD-10-CM | POA: Diagnosis not present

## 2019-01-17 DIAGNOSIS — I5032 Chronic diastolic (congestive) heart failure: Secondary | ICD-10-CM | POA: Diagnosis not present

## 2019-01-17 DIAGNOSIS — E1122 Type 2 diabetes mellitus with diabetic chronic kidney disease: Secondary | ICD-10-CM

## 2019-01-17 DIAGNOSIS — E782 Mixed hyperlipidemia: Secondary | ICD-10-CM | POA: Diagnosis not present

## 2019-01-17 DIAGNOSIS — F411 Generalized anxiety disorder: Secondary | ICD-10-CM | POA: Diagnosis not present

## 2019-01-17 NOTE — Assessment & Plan Note (Signed)
Again she continues to decline psychotherapy.  She is sleeping better with the increase in the trazodone no other medication changes will be made today.

## 2019-01-17 NOTE — Patient Instructions (Addendum)
Call Dr. [name] about your blood sugars Referral to diabetic educations Take your metalazone tomorrow morning  Flu shot done  F/U 4 months

## 2019-01-17 NOTE — Assessment & Plan Note (Signed)
Diabetes is uncontrolled but she is not eating enough protein throughout the day instead lots of carbs.  Discussed fluctuations in her blood sugar.  I do not think that she would be a good candidate for weight loss surgery she is too high risk in the setting of her heart failure chronic kidney disease uncontrolled diabetes.  She is already had partial amputation of her foot.  I do think she would benefit however from diabetes reeducation with the dietitian and she is willing to do this if they can do it by telehealth.

## 2019-01-17 NOTE — Assessment & Plan Note (Signed)
>>  ASSESSMENT AND PLAN FOR CHRONIC DIASTOLIC CHF (CONGESTIVE HEART FAILURE) (HCC) WRITTEN ON 01/17/2019  5:32 PM BY [place], KAWANTA F  Weight is up 3 pounds in the past few weeks.  She has not been consistent with her diuretics.  Advised her to take the metolazone first thing in the morning she has an appoint with cardiology on Wednesday.  We discussed her nutrition she is eating too much fast food with very high sodium count.  Also affects her diabetes.

## 2019-01-17 NOTE — Assessment & Plan Note (Signed)
Weight is up 3 pounds in the past few weeks.  She has not been consistent with her diuretics.  Advised her to take the metolazone first thing in the morning she has an appoint with cardiology on Wednesday.  We discussed her nutrition she is eating too much fast food with very high sodium count.  Also affects her diabetes.

## 2019-01-17 NOTE — Progress Notes (Signed)
Subjective:    Patient ID: Susan [name], female    DOB: 1951/01/10, 68 y.o.   MRN: [id number]  Patient presents for Follow-up (is not fasting)  Here to follow-up chronic medical problems.  She is being followed by multiple specialists including endocrinology, cardiology, nephrology Her last visit we discussed  her depression and insomnia.  I increase the trazodone to 100 mg at bedtime.  Also psychotherapy but she continues to decline.  Sleep did improve with the trazodone increase.   She is following with her specialist.  She has follow-up with cardiology for her CHF.  She has had problems keeping her edema under control.  I reviewed her chart there [facility] staff trying to help her with medication management and keep her on task to avoid hospitalizations.  She is currently on furosemide 80 mg twice a day and metolazone twice a week She did not take her metolazone today states that she had a doctor's appointment with me.  She has Omnipod for her diabetes which is uncontrolled- last A1C 10%, also takes Novolog 15 units in the morning this morning she actually had hypoglycemia then states that her blood sugar was 69 when she finally checked it she did eat something and it slowly continued to come up.  She did recently have her OmniPod adjusted because her diabetes has been out of control.  On a kidney disease her last creatinine is 1.65 at her nephrologist last month.  They did ask her about weight loss surgery.  States that she discussed this with endocrinology want to know if I would sign her off for this.  She admits to eating a lot of fast food right now high sodium content lots of treats.  Review Of Systems:  GEN- denies fatigue, fever, weight loss,weakness, recent illness HEENT- denies eye drainage, change in vision, nasal discharge, CVS- denies chest pain, palpitations RESP- denies SOB, cough, wheeze ABD- denies N/V, change in stools, abd  pain GU- denies dysuria, hematuria, dribbling, incontinence MSK- +joint pain, muscle aches, injury Neuro- denies headache, dizziness, syncope, seizure activity       Objective:    BP 112/64   Pulse 82   Temp 98.6 F (37 C) (Oral)   Resp 14   Ht 5\' 6"  (1.676 m)   Wt 270 lb (122.5 kg)   SpO2 98%   BMI 43.58 kg/m  GEN- NAD, alert and oriented x3 HEENT- PERRL, EOMI, non injected sclera, pink conjunctiva, MMM, oropharynx clear, Neck- Supple, no thyromegaly CVS- RRR, no murmur RESP-CTAB ABD-NABS,soft,NT,ND Psych- typical flat like affect, not anxious, no SI EXT- chronic venous stasis changes 1+ pitting edema bilat , partial right foot amputation, no lesiosn on left foot  Pulses- Radial 2+, DP- diminished Left foot         Assessment & Plan:      Problem List Items Addressed This Visit      Unprioritized   Chronic diastolic CHF (congestive heart failure) (HCC) (Chronic)    Weight is up 3 pounds in the past few weeks.  She has not been consistent with her diuretics.  Advised her to take the metolazone first thing in the morning she has an appoint with cardiology on Wednesday.  We discussed her nutrition she is eating too much fast food with very high sodium count.  Also affects her diabetes.      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 mellitus [facility])    I reviewed nephrology note  Diabetes mellitus type II, uncontrolled ([place]) - Primary    Diabetes is uncontrolled but she is not eating enough protein throughout the day instead lots of carbs.  Discussed fluctuations in her blood sugar.  I do not think that she would be a good candidate for weight loss surgery she is too high risk in the setting of her heart failure chronic kidney disease uncontrolled diabetes.  She is already had partial amputation of her foot.  I do think she would benefit however from diabetes reeducation with the dietitian and she is willing to do this if they can do it by telehealth.      Diabetic  neuropathy (HCC)   Generalized anxiety disorder   Hyperlipemia   Relevant Orders   Comprehensive metabolic panel   Lipid panel   Insomnia   MDD (major depressive disorder)    Again she continues to decline psychotherapy.  She is sleeping better with the increase in the trazodone no other medication changes will be made today.      Peripheral edema    Other Visit Diagnoses    Need for immunization against influenza       Relevant Orders   Flu Vaccine QUAD High Dose(Fluad) (Completed)      Note: This dictation was prepared with Dragon dictation along with smaller phrase technology. Any transcriptional errors that result from this process are unintentional.

## 2019-01-17 NOTE — Assessment & Plan Note (Signed)
I reviewed nephrology note

## 2019-01-18 LAB — LIPID PANEL
Cholesterol: 148 mg/dL (ref <200)
HDL: 53 mg/dL (ref >=50)
LDL Cholesterol (Calc): 65 mg/dL (calc)
Non-HDL Cholesterol (Calc): 95 mg/dL (calc) (ref <130)
Total CHOL/HDL Ratio: 2.8 (calc) (ref <5.0)
Triglycerides: 244 mg/dL — ABNORMAL HIGH (ref <150)

## 2019-01-18 LAB — COMPREHENSIVE METABOLIC PANEL
AG Ratio: 1.3 (calc) (ref 1.0–2.5)
ALT: 13 U/L (ref 6–29)
AST: 15 U/L (ref 10–35)
Albumin: 3.6 g/dL (ref 3.6–5.1)
Alkaline phosphatase (APISO): 126 U/L (ref 37–153)
BUN/Creatinine Ratio: 37 (calc) — ABNORMAL HIGH (ref 6–22)
BUN: 69 mg/dL — ABNORMAL HIGH (ref 7–25)
CO2: 28 mmol/L (ref 20–32)
Calcium: 9.4 mg/dL (ref 8.6–10.4)
Chloride: 103 mmol/L (ref 98–110)
Creat: 1.88 mg/dL — ABNORMAL HIGH (ref 0.50–0.99)
Globulin: 2.7 g/dL (calc) (ref 1.9–3.7)
Glucose, Bld: 53 mg/dL — ABNORMAL LOW (ref 65–99)
Potassium: 4.1 mmol/L (ref 3.5–5.3)
Sodium: 139 mmol/L (ref 135–146)
Total Bilirubin: 0.3 mg/dL (ref 0.2–1.2)
Total Protein: 6.3 g/dL (ref 6.1–8.1)

## 2019-01-18 LAB — EXTRA LAV TOP TUBE

## 2019-01-19 ENCOUNTER — Encounter (HOSPITAL_COMMUNITY): Payer: PPO

## 2019-01-25 ENCOUNTER — Ambulatory Visit: Payer: Self-pay | Admitting: Pharmacist

## 2019-01-25 ENCOUNTER — Encounter: Payer: Self-pay | Admitting: Family

## 2019-01-25 ENCOUNTER — Ambulatory Visit (INDEPENDENT_AMBULATORY_CARE_PROVIDER_SITE_OTHER): Payer: PPO | Admitting: Family

## 2019-01-25 ENCOUNTER — Other Ambulatory Visit: Payer: Self-pay | Admitting: Pharmacist

## 2019-01-25 DIAGNOSIS — E1142 Type 2 diabetes mellitus with diabetic polyneuropathy: Secondary | ICD-10-CM | POA: Diagnosis not present

## 2019-01-25 DIAGNOSIS — Z794 Long term (current) use of insulin: Secondary | ICD-10-CM | POA: Diagnosis not present

## 2019-01-25 DIAGNOSIS — I872 Venous insufficiency (chronic) (peripheral): Secondary | ICD-10-CM | POA: Diagnosis not present

## 2019-01-25 NOTE — Patient Outreach (Addendum)
[facility]) Care Management  01/25/2019  Susan [name] 1951-05-17 [id number]   Patient was called to follow up on medication management and diabetes control. Unfortunately, she did not answer the phone. HIPAA compliant message was left on both of her voicemails.  Plan: Send patient an unsuccessful outreach letter. Call patient back in 14-21 business days.   [name], PharmD, BCACP [facility] Clinical Pharmacist [phone]  ADDENDUM  Before I could send the patient a letter, she called me back. HIPAA identifiers were obtained.  Patient reported feeling "ok" but that she had to go see Dr. Sharol Given today due to a sore on her left leg.  Patient reported he did not add any therapy.  Breakfast -129 mg/dl 1pm- 189 mg/dl  HgA1c 10.1% 3/20, 11%(patient reported) 8/20  Dr. [name] increased insulin dose last week in response to her increased HgA1c  Patient is currently managing her blood sugars with Humulin N and Humulin R (sliding scale with Omnipod).--Followed by Endocrinology--Dr. [name]  Patient reported eating 2 boiled eggs this morning for breakfast and a Glucerna at lunch.  Patient saw her PCP last week who per chart review, will be making a referral for the patient to see a dietician via telehealth.  Patient said she was considering Bariatric Surgery as she was referred by her Nephrologist. However, she said her PCP and Endocrinologist told her she would not be a good candidate for surgery and anesthesia.    Plan: Call patient in 1 week to follow up on blood sugars.   [name], PharmD, [place] Clinical Pharmacist [phone]

## 2019-01-25 NOTE — Progress Notes (Signed)
Office Visit Note   Patient: Susan [name]           Date of Birth: 1950/11/06           MRN: [id number] Visit Date: 01/25/2019              Requested by: Alycia Rossetti, MD [address] [place],  [place] [address] PCP: Alycia Rossetti, MD  No chief complaint on file.     HPI: The patient is a 68 year old woman seen today for a new concern. She is unsure how she developed a blister over the dorsum of her left foot. Is in medical compression stockings today. Has a dorsal foot ulcer has been cleansing with peroxide every other day and apply neosporin bandaids. Has been wearing compression stockings as well. This has been on going for 3 weeks without improvement.  Assessment & Plan: Visit Diagnoses: No diagnosis found.  Plan: cleanse the ulcer every other day. Will apply the medical compression stocking with direct skin contact. Wear this around the clock. Stop the peroxide. Will follow up in 2 weeks. Call back with any concern of worsening.  Follow-Up Instructions: Return in about 2 weeks (around 02/08/2019).   Ortho Exam  Patient is alert, oriented, no adenopathy, well-dressed, normal affect, normal respiratory effort. On examination of LLE has venous insufficiency swelling. Brawny skin color changes. Has a dorsal foot ulcer that is 1 cm x 5 mm. 2 mm of depth. Some surrounding maceration. No drainage. No odor. No sign of infection.  Imaging: No results found. No images are attached to the encounter.  Labs: Lab Results  Component Value Date   HGBA1C 8.3 (H) 04/30/2018   HGBA1C 9.3 (H) 09/25/2017   HGBA1C 11.4 (H) 07/16/2017   CRP 8.4 (H) 03/05/2018   LABURIC 9.1 (H) 10/12/2018   LABURIC 9.0 (H) 03/06/2018   REPTSTATUS 03/21/2018 FINAL 03/18/2018   CULT (A) 03/18/2018    20,000 COLONIES/mL KLEBSIELLA PNEUMONIAE 20,000 COLONIES/mL ENTEROCOCCUS FAECALIS    LABORGA KLEBSIELLA PNEUMONIAE (A) 03/18/2018   LABORGA ENTEROCOCCUS FAECALIS (A) 03/18/2018     Lab  Results  Component Value Date   ALBUMIN 3.0 (L) 03/19/2018   ALBUMIN 3.8 03/18/2018   ALBUMIN 2.6 (L) 02/14/2018   LABURIC 9.1 (H) 10/12/2018   LABURIC 9.0 (H) 03/06/2018    Lab Results  Component Value Date   MG 1.6 02/27/2017   MG 2.1 07/10/2015   MG 1.7 09/21/2013   No results found for: VD25OH  No results found for: PREALBUMIN CBC EXTENDED Latest Ref Rng & Units 04/30/2018 03/20/2018 03/19/2018  WBC 4.0 - 10.5 K/uL 6.0 5.4 6.2  RBC 3.87 - 5.11 MIL/uL 4.37 4.95 4.94  HGB 12.0 - 15.0 g/dL 10.6(L) 11.1(L) 11.3(L)  HCT 36.0 - 46.0 % 35.3(L) 37.8 36.7  PLT 150 - 400 K/uL 226 157 197  NEUTROABS 1.7 - 7.7 K/uL - - -  LYMPHSABS 0.7 - 4.0 K/uL - - -     There is no height or weight on file to calculate BMI.  Orders:  No orders of the defined types were placed in this encounter.  No orders of the defined types were placed in this encounter.    Procedures: No procedures performed  Clinical Data: No additional findings.  ROS:  All other systems negative, except as noted in the HPI. Review of Systems  Constitutional: Negative for chills and fever.  Cardiovascular: Positive for leg swelling.  Skin: Positive for wound. Negative for color  change.    Objective: Vital Signs: There were no vitals taken for this visit.  Specialty Comments:  No specialty comments available.  PMFS History: Patient Active Problem List   Diagnosis Date Noted  . Onychomycosis 06/21/2018  . Pressure injury of skin 03/19/2018  . Syncope 03/18/2018  . Osteomyelitis of second toe of right foot ([place])   . Toe osteomyelitis, right ([place])   . 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 ([place])   . Acute on chronic diastolic (congestive) heart failure ([place]) 02/13/2018  . PVD (peripheral vascular disease) ([place]) 10/26/2017  . Hypothyroid 07/27/2017  . PAH (pulmonary artery hypertension) ([place])   . Impaired ambulation 07/19/2017  . Hyperosmolar non-ketotic state in patient with type 2  diabetes mellitus ([place]) 07/15/2017  . Leg cramps 02/27/2017  . Peripheral edema 01/12/2017  . Diabetic neuropathy ([place]) 11/12/2016  . 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 mellitus ([place]) 10/24/2015  . Anemia 10/03/2015  . Generalized anxiety disorder 10/03/2015  . Insomnia 10/03/2015  . Diabetes mellitus type II, uncontrolled ([place]) 06/07/2015  . Chronic diastolic CHF (congestive heart failure) ([place]) 06/07/2015  . Non compliance with medical treatment 04/17/2014  . Rotator cuff tear 03/14/2014  . Morbid obesity ([place]) 09/23/2013  . Hypotension 12/25/2012  . Urinary incontinence   . MDD (major depressive disorder) 11/12/2010  . RBBB (right bundle branch block)   . CAD (coronary artery disease)   . Hyperlipemia 01/22/2009  . Essential hypertension 01/22/2009   Past Medical History:  Diagnosis Date  . Acute MI ([place]) 1999; 2007  . Anemia    hx  . Anginal pain ([place])   . Anxiety   . ARF (acute renal failure) ([place]) 06/2017   [place] [facility]  . Arthritis    "generalized" (03/15/2014)  . CAD (coronary artery disease)    MI in 2000 - MI  2007 - treated bare metal stent (no nuclear since then as 9/11)  . Carotid artery disease ([place])   . CHF (congestive heart failure) ([place])   . Chronic diastolic heart failure (HCC)    a) ECHO (08/2013) EF 55-60% and RV function nl b) RHC (08/2013) RA 4, RV 30/5/7, PA 25/10 (16), PCWP 7, Fick CO/CI 6.3/2.7, PVR 1.5 WU, PA 61 and 66%  . Daily headache    "~ every other day; since I fell in June" (03/15/2014)  . Depression   . Dyslipidemia   . Dyspnea   . Exertional shortness of breath   . HTN (hypertension)   . Hypothyroidism   . Neuropathy   . Obesity   . Osteoarthritis   . Peripheral neuropathy   . PONV (postoperative nausea and vomiting)   . RBBB (right bundle branch block)    Old  . Stroke [facility])    mini strokes  . Syncope    likely due to low blood sugar  . Tachycardia    Sinus tachycardia  . Type II diabetes mellitus (HCC)    Type II   . Urinary incontinence   . Venous insufficiency     Family History  Problem Relation Age of Onset  . Heart attack Mother 22    Past Surgical History:  Procedure Laterality Date  . ABDOMINAL HYSTERECTOMY  1980's  . AMPUTATION Right 02/24/2018   Procedure: RIGHT FOOT GREAT TOE AND 2ND TOE AMPUTATION;  Surgeon: Newt Minion, MD;  Location: [place];  Service: Orthopedics;  Laterality: Right;  . AMPUTATION Right 04/30/2018   Procedure: RIGHT TRANSMETATARSAL AMPUTATION;  Surgeon: Newt Minion, MD;  Location: [place];  Service: Orthopedics;  Laterality: Right;  . CATARACT EXTRACTION, BILATERAL Bilateral ?2013  . COLONOSCOPY W/ POLYPECTOMY    . CORONARY ANGIOPLASTY WITH STENT PLACEMENT  1999; 2007   "1 + 1"  . EYE SURGERY Bilateral    [place]  . KNEE ARTHROSCOPY Left 10/25/2006  . RIGHT HEART CATH N/A 07/24/2017   Procedure: RIGHT HEART CATH;  Surgeon: Jolaine Artist, MD;  Location: [place] CV LAB;  Service: Cardiovascular;  Laterality: N/A;  . RIGHT HEART CATHETERIZATION N/A 09/22/2013   Procedure: RIGHT HEART CATH;  Surgeon: Jolaine Artist, MD;  Location[facility] C[facility];  Service: Cardiovascular;  Laterality: N/A;  . SHOULDER ARTHROSCOPY WITH OPEN ROTATOR CUFF REPAIR Right 03/14/2014   Procedure: RIGHT SHOULDER ARTHROSCOPY WITH BICEPS RELEASE, OPEN SUBSCAPULA REPAIR, OPEN SUPRASPINATUS REPAIR.;  Surgeon: Meredith Pel, MD;  Location: [place];  Service: Orthopedics;  Laterality: Right;  . TOE AMPUTATION Right 02/24/2018   GREAT TOE AND 2ND TOE AMPUTATION  . TUBAL LIGATION  1970's   Social History   Occupational History    Employer: UNEMPLOYED  Tobacco Use  . Smoking status: Former Smoker    Packs/day: 3.00    Years: 32.00    Pack years: 96.00    Types: Cigarettes    Quit date: 10/24/1997    Years since quitting: 21.2  . Smokeless tobacco: Never Used  Substance and Sexual Activity  . Alcohol use: Not Currently    Comment: "might have 2-3 daiquiris in the summer"  . Drug  use: No  . Sexual activity: Never

## 2019-01-27 ENCOUNTER — Other Ambulatory Visit: Payer: Self-pay

## 2019-01-27 ENCOUNTER — Ambulatory Visit (HOSPITAL_COMMUNITY)
Admission: RE | Admit: 2019-01-27 | Discharge: 2019-01-27 | Disposition: A | Discharge status: Home / Self Care | Payer: PPO | Source: Ambulatory Visit | Attending: Internal Medicine | Admitting: Internal Medicine

## 2019-01-27 ENCOUNTER — Encounter (HOSPITAL_COMMUNITY): Payer: Self-pay

## 2019-01-27 VITALS — BP 124/82 | HR 91 | Wt 271.0 lb

## 2019-01-27 DIAGNOSIS — Z9641 Presence of insulin pump (external) (internal): Secondary | ICD-10-CM | POA: Diagnosis not present

## 2019-01-27 DIAGNOSIS — Z8249 Family history of ischemic heart disease and other diseases of the circulatory system: Secondary | ICD-10-CM | POA: Insufficient documentation

## 2019-01-27 DIAGNOSIS — E1122 Type 2 diabetes mellitus with diabetic chronic kidney disease: Secondary | ICD-10-CM | POA: Insufficient documentation

## 2019-01-27 DIAGNOSIS — R5383 Other fatigue: Secondary | ICD-10-CM | POA: Diagnosis not present

## 2019-01-27 DIAGNOSIS — E785 Hyperlipidemia, unspecified: Secondary | ICD-10-CM | POA: Insufficient documentation

## 2019-01-27 DIAGNOSIS — I2510 Atherosclerotic heart disease of native coronary artery without angina pectoris: Secondary | ICD-10-CM | POA: Insufficient documentation

## 2019-01-27 DIAGNOSIS — Z8673 Personal history of transient ischemic attack (TIA), and cerebral infarction without residual deficits: Secondary | ICD-10-CM | POA: Insufficient documentation

## 2019-01-27 DIAGNOSIS — I252 Old myocardial infarction: Secondary | ICD-10-CM | POA: Insufficient documentation

## 2019-01-27 DIAGNOSIS — N183 Chronic kidney disease, stage 3 (moderate): Secondary | ICD-10-CM | POA: Diagnosis not present

## 2019-01-27 DIAGNOSIS — Z79899 Other long term (current) drug therapy: Secondary | ICD-10-CM | POA: Insufficient documentation

## 2019-01-27 DIAGNOSIS — Z79890 Hormone replacement therapy (postmenopausal): Secondary | ICD-10-CM | POA: Diagnosis not present

## 2019-01-27 DIAGNOSIS — D649 Anemia, unspecified: Secondary | ICD-10-CM | POA: Insufficient documentation

## 2019-01-27 DIAGNOSIS — Z89431 Acquired absence of right foot: Secondary | ICD-10-CM | POA: Diagnosis not present

## 2019-01-27 DIAGNOSIS — Z794 Long term (current) use of insulin: Secondary | ICD-10-CM | POA: Diagnosis not present

## 2019-01-27 DIAGNOSIS — I130 Hypertensive heart and chronic kidney disease with heart failure and stage 1 through stage 4 chronic kidney disease, or unspecified chronic kidney disease: Secondary | ICD-10-CM | POA: Diagnosis not present

## 2019-01-27 DIAGNOSIS — I10 Essential (primary) hypertension: Secondary | ICD-10-CM

## 2019-01-27 DIAGNOSIS — I872 Venous insufficiency (chronic) (peripheral): Secondary | ICD-10-CM | POA: Diagnosis not present

## 2019-01-27 DIAGNOSIS — Z7902 Long term (current) use of antithrombotics/antiplatelets: Secondary | ICD-10-CM | POA: Insufficient documentation

## 2019-01-27 DIAGNOSIS — Z885 Allergy status to narcotic agent status: Secondary | ICD-10-CM | POA: Diagnosis not present

## 2019-01-27 DIAGNOSIS — Z6841 Body Mass Index (BMI) 40.0 and over, adult: Secondary | ICD-10-CM | POA: Diagnosis not present

## 2019-01-27 DIAGNOSIS — Z87891 Personal history of nicotine dependence: Secondary | ICD-10-CM | POA: Diagnosis not present

## 2019-01-27 DIAGNOSIS — E669 Obesity, unspecified: Secondary | ICD-10-CM | POA: Diagnosis not present

## 2019-01-27 DIAGNOSIS — Z955 Presence of coronary angioplasty implant and graft: Secondary | ICD-10-CM | POA: Diagnosis not present

## 2019-01-27 DIAGNOSIS — E039 Hypothyroidism, unspecified: Secondary | ICD-10-CM | POA: Insufficient documentation

## 2019-01-27 DIAGNOSIS — I428 Other cardiomyopathies: Secondary | ICD-10-CM | POA: Insufficient documentation

## 2019-01-27 DIAGNOSIS — M1990 Unspecified osteoarthritis, unspecified site: Secondary | ICD-10-CM | POA: Insufficient documentation

## 2019-01-27 DIAGNOSIS — I5032 Chronic diastolic (congestive) heart failure: Secondary | ICD-10-CM | POA: Insufficient documentation

## 2019-01-27 DIAGNOSIS — E1142 Type 2 diabetes mellitus with diabetic polyneuropathy: Secondary | ICD-10-CM | POA: Diagnosis not present

## 2019-01-27 DIAGNOSIS — Z7982 Long term (current) use of aspirin: Secondary | ICD-10-CM | POA: Insufficient documentation

## 2019-01-27 NOTE — Patient Instructions (Signed)
No labs done today.   Your medication list has been updated.  No medication changes made today. Continue all current medications as prescribed.  Your physician recommends that you schedule a follow-up appointment in: 3 months with [name],NP  At the [facility], you and your health needs are our priority. As part of our continuing mission to provide you with exceptional heart care, we have created designated Provider Care Teams. These Care Teams include your primary Cardiologist (physician) and Advanced Practice Providers (APPs- Physician Assistants and Nurse Practitioners) who all work together to provide you with the care you need, when you need it.   You may see any of the following providers on your designated Care Team at your next follow up: Marland Kitchen Dr Glori Bickers . Dr Loralie Champagne . Darrick Grinder, NP   Please be sure to bring in all your medications bottles to every appointment.

## 2019-01-27 NOTE — Progress Notes (Signed)
01/27/2019 [name]   1950/10/24  [id number]  Primary Physician Jeanice Lim Velna Hatchet, MD Primary Cardiologist: Arvilla Meres, MD  Electrophysiologist: None   Reason for Visit/CC: F/u for chronic diastolic HF  HPI:  Susan [name] is a 68 y.o. female who is being seen today for 4 week f/u for chronic diastolic HF after recent diuretic dose adjustment.   PMH includes RBBB, DM, Hypothyroid, diastolic heart failure, CAD MI 2000/2007 BMS 2007, TIA, obesity, CKD Stage IV,  R transmetatarsal amputation. .    Admitted June 1st through June 3rd, 2017 with altered mental status, hypothermia, and weakness. Diuretics initially held and later restarted.   Admitted 2/27-07/28/17 for AKI. Torsemide was held and restarted at 60 mg BID. She had RHC 07/24/17 that showed mild PAH with evidence of RV strain likely due to OHS/OSA. She was discharged to SNF.  Admitted 01/2018 with A/C diastolic HF. Diuresed with IV lasix.   Admitted 10/24-10/27/19 with syncope. She was orthostatic. Had AKI with creatinine up to 3.33 and required IVF. Renal US showed no hydronephrosis. Losartan and imdur were DC'd. Creatinine 1.76 on day of DC.   She had recent clinic f/u with Tonye Becket, NP, 12/13/18 and noted DOE. No orthopnea/PND. Eating takeout frequently. Home weights also trending up. Noted to have 1+ [name]. Diuretics increased. Pt instructed to continue torsemide 80 BID and increase metolazone to 2 x/week. She ended up having f/u labs done at PCP office last week on 8/24. BMP personally reviewed. SCr stable at 1.8 c/w baseline and K was normal at 4.1.  Today in f/u, she reports that she feels a bit better. Metolazone works well. Good diuresis on those days and she feels better. Less SOB and legs are less "heavy". Checking weight daily at home. Fluctuates between 270-276 lb. She has been trying to watch salt intake.   She tells me that she actually has been taking her toresimide incorrectly. Was ordered to take 80 mg BID but  only taking 60 mg BID.    Current Meds  Medication Sig  . allopurinol (ZYLOPRIM) 100 MG tablet Take 1 tablet (100 mg total) by mouth 2 (two) times daily.  . Ascorbic Acid (VITAMIN C) 250 MG CHEW Chew 1 tablet by mouth daily.  Marland Kitchen aspirin EC 81 MG tablet Take 81 mg by mouth daily.  . carvedilol (COREG) 12.5 MG tablet TAKE 1 TABLET BY MOUTH TWICE A DAY WITH MEALS  . cholecalciferol (VITAMIN D) 1000 units tablet Take 1,000 Units by mouth daily.  . clopidogrel (PLAVIX) 75 MG tablet TAKE 1 TABLET BY MOUTH DAILY WITH BREAKFAST.  Marland Kitchen colchicine 0.6 MG tablet Take 1 tablet (0.6 mg total) by mouth daily. As needed for gout flare  . escitalopram (LEXAPRO) 20 MG tablet TAKE 1 TABLET BY MOUTH EVERY DAY  . ferrous sulfate 325 (65 FE) MG tablet Take 1 tablet (325 mg total) by mouth 2 (two) times daily with a meal. Reported on 08/21/2015  . Insulin Disposable Pump (OMNIPOD DASH 5 PACK PODS) MISC CHANGE EVERY 72 HOURS  . insulin NPH Human (HUMULIN N,NOVOLIN N) 100 UNIT/ML injection Inject into the skin daily as needed. Inject 10 units in the morning when blood sugar is 100-199. Inject 15 units in the morning if blood sugar is >200.  Marland Kitchen insulin regular (NOVOLIN R,HUMULIN R) 100 units/mL injection Inject 7-17 Units into the skin See admin instructions. With OMNIPOD Sliding scale 80-150 7 units, 150-200 9 units, 201-250 12 units, 251-300 14 units, 301-400 17 units.  Marland Kitchen  levothyroxine (SYNTHROID, LEVOTHROID) 50 MCG tablet Take 1 tablet (50 mcg total) by mouth daily before breakfast.  . magnesium oxide (MAG-OX) 400 MG tablet TAKE 1 TABLET BY MOUTH EVERY DAY  . metolazone (ZAROXOLYN) 2.5 MG tablet Take 1 tablet (2.5 mg total) by mouth as directed. Monday and Friday  . nitroGLYCERIN (NITROSTAT) 0.4 MG SL tablet PLACE 1 TABLET (0.4 MG TOTAL) UNDER THE TONGUE EVERY 5 (FIVE) MINUTES AS NEEDED FOR CHEST PAIN.  Marland Kitchen nystatin cream (MYCOSTATIN) Apply 1 application topically 2 (two) times daily.  Letta Pate VERIO test strip   .  pregabalin (LYRICA) 150 MG capsule Take 1 capsule (150 mg total) by mouth 2 (two) times daily.  Marland Kitchen PROAIR HFA 108 (90 Base) MCG/ACT inhaler TAKE 2 PUFFS BY MOUTH EVERY 6 HOURS AS NEEDED FOR WHEEZE OR SHORTNESS OF BREATH  . rosuvastatin (CRESTOR) 20 MG tablet TAKE 1 TABLET BY MOUTH EVERY DAY  . torsemide (DEMADEX) 20 MG tablet Take 60 mg by mouth 2 (two) times daily.  . traZODone (DESYREL) 100 MG tablet Take 1 tablet (100 mg total) by mouth at bedtime as needed for sleep.  . vitamin E (VITAMIN E) 1000 UNIT capsule Take 1,000 Units by mouth daily.  . [DISCONTINUED] torsemide (DEMADEX) 20 MG tablet Take 4 tablets (80 mg total) by mouth 2 (two) times daily.   Allergies  Allergen Reactions  . Codeine Nausea And Vomiting   Past Medical History:  Diagnosis Date  . Acute MI (HCC) 1999; 2007  . Anemia    hx  . Anginal pain (HCC)   . Anxiety   . ARF (acute renal failure) (HCC) 06/2017   [facility] Kidney Asso  . Arthritis    "generalized" (03/15/2014)  . CAD (coronary artery disease)    MI in 2000 - MI  2007 - treated bare metal stent (no nuclear since then as 9/11)  . Carotid artery disease (HCC)   . CHF (congestive heart failure) (HCC)   . Chronic diastolic heart failure (HCC)    a) ECHO (08/2013) EF 55-60% and RV function nl b) RHC (08/2013) RA 4, RV 30/5/7, PA 25/10 (16), PCWP 7, Fick CO/CI 6.3/2.7, PVR 1.5 WU, PA 61 and 66%  . Daily headache    "~ every other day; since I fell in June" (03/15/2014)  . Depression   . Dyslipidemia   . Dyspnea   . Exertional shortness of breath   . HTN (hypertension)   . Hypothyroidism   . Neuropathy   . Obesity   . Osteoarthritis   . Peripheral neuropathy   . PONV (postoperative nausea and vomiting)   . RBBB (right bundle branch block)    Old  . Stroke [facility])    mini strokes  . Syncope    likely due to low blood sugar  . Tachycardia    Sinus tachycardia  . Type II diabetes mellitus (HCC)    Type II  . Urinary incontinence   . Venous  insufficiency    Family History  Problem Relation Age of Onset  . Heart attack Mother 38   Past Surgical History:  Procedure Laterality Date  . ABDOMINAL HYSTERECTOMY  1980's  . AMPUTATION Right 02/24/2018   Procedure: RIGHT FOOT GREAT TOE AND 2ND TOE AMPUTATION;  Surgeon: Nadara Mustard, MD;  Location: [facility] OR;  Service: Orthopedics;  Laterality: Right;  . AMPUTATION Right 04/30/2018   Procedure: RIGHT TRANSMETATARSAL AMPUTATION;  Surgeon: Nadara Mustard, MD;  Location: [facility] OR;  Service: Orthopedics;  Laterality: Right;  .  CATARACT EXTRACTION, BILATERAL Bilateral ?2013  . COLONOSCOPY W/ POLYPECTOMY    . CORONARY ANGIOPLASTY WITH STENT PLACEMENT  1999; 2007   "1 + 1"  . EYE SURGERY Bilateral    lazer  . KNEE ARTHROSCOPY Left 10/25/2006  . RIGHT HEART CATH N/A 07/24/2017   Procedure: RIGHT HEART CATH;  Surgeon: Dolores Patty, MD;  Location: [facility] INVASIVE CV LAB;  Service: Cardiovascular;  Laterality: N/A;  . RIGHT HEART CATHETERIZATION N/A 09/22/2013   Procedure: RIGHT HEART CATH;  Surgeon: Dolores Patty, MD;  Location: [facility] CATH LAB;  Service: Cardiovascular;  Laterality: N/A;  . SHOULDER ARTHROSCOPY WITH OPEN ROTATOR CUFF REPAIR Right 03/14/2014   Procedure: RIGHT SHOULDER ARTHROSCOPY WITH BICEPS RELEASE, OPEN SUBSCAPULA REPAIR, OPEN SUPRASPINATUS REPAIR.;  Surgeon: Cammy Copa, MD;  Location[facility] OR;  Service: Orthopedics;  Laterality: Right;  . TOE AMPUTATION Right 02/24/2018   GREAT TOE AND 2ND TOE AMPUTATION  . TUBAL LIGATION  1970's   Social History   Socioeconomic History  . Marital status: Married    Spouse name: Not on file  . Number of children: 3  . Years of education: 12th  . Highest education level: Not on file  Occupational History    Employer: UNEMPLOYED  Social Needs  . Financial resource strain: Not on file  . Food insecurity    Worry: Not on file    Inability: Not on file  . Transportation needs    Medical: Not on file    Non-medical: Not on file   Tobacco Use  . Smoking status: Former Smoker    Packs/day: 3.00    Years: 32.00    Pack years: 96.00    Types: Cigarettes    Quit date: 10/24/1997    Years since quitting: 21.2  . Smokeless tobacco: Never Used  Substance and Sexual Activity  . Alcohol use: Not Currently    Comment: "might have 2-3 daiquiris in the summer"  . Drug use: No  . Sexual activity: Never  Lifestyle  . Physical activity    Days per week: Not on file    Minutes per session: Not on file  . Stress: Not on file  Relationships  . Social Musician on phone: Not on file    Gets together: Not on file    Attends religious service: Not on file    Active member of club or organization: Not on file    Attends meetings of clubs or organizations: Not on file    Relationship status: Not on file  . Intimate partner violence    Fear of current or ex partner: Not on file    Emotionally abused: Not on file    Physically abused: Not on file    Forced sexual activity: Not on file  Other Topics Concern  . Not on file  Social History Narrative   Pt lives at home with her spouse.   Caffeine Use- 3 sodas daily     Lipid Panel     Component Value Date/Time   CHOL 148 01/17/2019 1608   TRIG 244 (H) 01/17/2019 1608   HDL 53 01/17/2019 1608   CHOLHDL 2.8 01/17/2019 1608   VLDL 33 07/24/2017 0528   LDLCALC 65 01/17/2019 1608    Review of Systems: General: negative for chills, fever, night sweats or weight changes.  Cardiovascular: negative for chest pain, dyspnea on exertion, edema, orthopnea, palpitations, paroxysmal nocturnal dyspnea or shortness of breath Dermatological: negative for rash Respiratory: negative for  cough or wheezing Urologic: negative for hematuria Abdominal: negative for nausea, vomiting, diarrhea, bright red blood per rectum, melena, or hematemesis Neurologic: negative for visual changes, syncope, or dizziness All other systems reviewed and are otherwise negative except as noted  above.   Physical Exam:  Blood pressure 124/82, pulse 91, weight 122.9 kg (271 lb), SpO2 94 %.  General appearance: alert, cooperative, no distress and moderately obese Neck: no carotid bruit and no JVD Lungs: clear to auscultation bilaterally Heart: regular rate and rhythm, S1, S2 normal, no murmur, click, rub or gallop Extremities: extremities normal, atraumatic, no cyanosis or edema Pulses: 2+ and symmetric Skin: Skin color, texture, turgor normal. No rashes or lesions Neurologic: Grossly normal  EKG not performed -- personally reviewed   ASSESSMENT AND PLAN:   1. Chronic Diastolic Heart Failure: NICM, Echo 06/2017: EF 55-60%, grade 1 DD - Echo 03/19/18: EF 55-60%, grade 1 DD - NYHA II-III.  - Volume status better after metolazone increase  - has actually been taking her toresimide incorrectly. Was ordered to take 80 mg BID but only taking 60 mg BID.  - ReDs Clip measurement at 24%, optimal volume status.  - no further diuretic dose adjustments needed. continue current regimen.  - She ended up having f/u labs done at PCP office last week on 8/24. BMP personally reviewed. SCr stable at 1.8 c/w baseline and K was normal at 4.1. - continue daily weights and low salt diet   2.  Uncontrolled DM:  - Follows closely with Dr Lurene Shadow. Has an insulin pump.   3. CAD: history of CAD with BMS in 2007.  low risk nuc study in 2018 Denies angina  - Continue statin, asa and  blocker  4. Obesity: - Body mass index is 43.74 kg/m. - Previously referred to weight loss clinic but she has not yet scheduled an appt.  6. HTN: controlled on current regimen - continue coreg and diuretics   7. CKD Stage III - Scr stable after metolazone  - Followed by Dr Signe Colt.   8. Day time fatigue - Refuses sleep study. We revisited this again today and does not want to pursue.   9.  S/p right transmetatarsal amputation - Followed by Dr Lajoyce Corners.    Follow-Up in 3 months.   Shayleen Eppinger Delmer Islam,  [facility] 01/27/2019 4:39 PM

## 2019-02-01 ENCOUNTER — Ambulatory Visit: Payer: PPO | Admitting: Orthopedic Surgery

## 2019-02-02 ENCOUNTER — Other Ambulatory Visit: Payer: Self-pay | Admitting: Pharmacist

## 2019-02-02 NOTE — Patient Outreach (Signed)
[facility]) Care Management  02/02/2019   LATE ENTRY FOR 02/01/2019  Susan [name] [birth year] [id number]   Patient called to ask a question about her insulin. HIPAA identifiers were obtained. She wanted to make sure she was supposed to be taking both Novolin N and R.  Her medication list was reviewed and her insulin dose was confirmed.  Called patient back to verify blood sugars. She reported it was 265 mg/dl this morning before breakfast.    Patient reported eating rotisserie chicken for dinner, snack crackers, and a jello cup with fruit before bedtime.  She said her blood sugar 520 mg/dl yesterday before supper.   Discussed healthy food choices as well as meal planning while on vacation. Unfortunately, patient has a long history of uncontrolled blood sugars. She expressed extreme interest in getting her blood sugars under 200 and welcomed frequent pharmacy education calls.  Plan: Call patient back in 2 weeks (she is going to the beach next week)   [name], PharmD, [place] Clinical Pharmacist [phone]

## 2019-02-03 ENCOUNTER — Ambulatory Visit: Payer: Self-pay | Admitting: Pharmacist

## 2019-02-08 ENCOUNTER — Ambulatory Visit
Admission: EM | Admit: 2019-02-08 | Discharge: 2019-02-08 | Disposition: A | Discharge status: Home / Self Care | Payer: PPO | Attending: Emergency Medicine | Admitting: Emergency Medicine

## 2019-02-08 ENCOUNTER — Telehealth: Payer: Self-pay | Admitting: Emergency Medicine

## 2019-02-08 ENCOUNTER — Other Ambulatory Visit: Payer: Self-pay

## 2019-02-08 DIAGNOSIS — M545 Low back pain, unspecified: Secondary | ICD-10-CM

## 2019-02-08 DIAGNOSIS — L298 Other pruritus: Secondary | ICD-10-CM | POA: Diagnosis not present

## 2019-02-08 DIAGNOSIS — L299 Pruritus, unspecified: Secondary | ICD-10-CM

## 2019-02-08 MED ORDER — METHOCARBAMOL 500 MG PO TABS: 500.0000 mg | ORAL_TABLET | Freq: Every day | ORAL | 0 refills | Status: DC

## 2019-02-08 MED ORDER — LIDOCAINE 5 % EX PTCH: 1.0000 | MEDICATED_PATCH | CUTANEOUS | 0 refills | Status: DC

## 2019-02-08 MED ORDER — KETOROLAC TROMETHAMINE 30 MG/ML IJ SOLN
30.0000 mg | Freq: Once | INTRAMUSCULAR | Status: AC
  Administered 2019-02-08: 30 mg via INTRAMUSCULAR

## 2019-02-08 MED ORDER — CYCLOBENZAPRINE HCL 5 MG PO TABS: 5.0000 mg | ORAL_TABLET | Freq: Every day | ORAL | 0 refills | Status: DC

## 2019-02-08 NOTE — ED Triage Notes (Signed)
Pt states that she has right sided back pain after mowing yard last week

## 2019-02-08 NOTE — ED Triage Notes (Signed)
Pt also states that she has had itching all over for past 3 weeks

## 2019-02-08 NOTE — Telephone Encounter (Signed)
Flexeril not covered by insurance.  Robaxin sent in.

## 2019-02-08 NOTE — ED Provider Notes (Signed)
Susan [name]   [id number] 02/08/19 Arrival Time: 7494  CC: Back pain  SUBJECTIVE: History from: patient. Susan [name] is a 68 y.o. female hx significant for ARF, CAD, H/O MI, CHF, depression, dyslipidemia, HTN, hypothyroid, DM (on insulin), peripheral neuropathy, and H/O stroke, complains of right low back pain that began 1.5 - 2 weeks ago.  Symptoms began after mowing her lawn with her riding lawn mower.  Localizes the pain to the right low back.  Describes the pain as constant, throbbing, dull, achy, and sharp in character.  Has tried OTC medications like ibuprofen and tylenol without relief.  Symptoms are made worse with back ROM.   Denies fever, chills, CP, SOB, erythema, ecchymosis, effusion, weakness, numbness and tingling, saddle paresthesias, loss of bowel or bladder function.      Patient also mentions intermittent itching for the past weeks.  Denies rash, precipitating event, trauma, or changes in products like detergents, lotions, body wash.  Has tried calamine lotion with minimal relief.  Worse with itching.  Denies similar symptoms in the past.    Blood sugar this AM 300.   ROS: As per HPI.  All other pertinent ROS negative.     Past Medical History:  Diagnosis Date  . Acute MI ([place]) 1999; 2007  . Anemia    hx  . Anginal pain ([place])   . Anxiety   . ARF (acute renal failure) ([place]) 06/2017    Kidney Asso  . Arthritis    "generalized" (03/15/2014)  . CAD (coronary artery disease)    MI in 2000 - MI  2007 - treated bare metal stent (no nuclear since then as 9/11)  . Carotid artery disease ([place])   . CHF (congestive heart failure) ([place])   . Chronic diastolic heart failure (HCC)    a) ECHO (08/2013) EF 55-60% and RV function nl b) RHC (08/2013) RA 4, RV 30/5/7, PA 25/10 (16), PCWP 7, Fick CO/CI 6.3/2.7, PVR 1.5 WU, PA 61 and 66%  . Daily headache    "~ every other day; since I fell in June" (03/15/2014)  . Depression   . Dyslipidemia   . Dyspnea   .  Exertional shortness of breath   . HTN (hypertension)   . Hypothyroidism   . Neuropathy   . Obesity   . Osteoarthritis   . Peripheral neuropathy   . PONV (postoperative nausea and vomiting)   . RBBB (right bundle branch block)    Old  . Stroke [facility])    mini strokes  . Syncope    likely due to low blood sugar  . Tachycardia    Sinus tachycardia  . Type II diabetes mellitus (HCC)    Type II  . Urinary incontinence   . Venous insufficiency    Past Surgical History:  Procedure Laterality Date  . ABDOMINAL HYSTERECTOMY  1980's  . AMPUTATION Right 02/24/2018   Procedure: RIGHT FOOT GREAT TOE AND 2ND TOE AMPUTATION;  Surgeon: Newt Minion, MD;  Location: [place];  Service: Orthopedics;  Laterality: Right;  . AMPUTATION Right 04/30/2018   Procedure: RIGHT TRANSMETATARSAL AMPUTATION;  Surgeon: Newt Minion, MD;  Location: [place];  Service: Orthopedics;  Laterality: Right;  . CATARACT EXTRACTION, BILATERAL Bilateral ?2013  . COLONOSCOPY W/ POLYPECTOMY    . CORONARY ANGIOPLASTY WITH STENT PLACEMENT  1999; 2007   "1 + 1"  . EYE SURGERY Bilateral    lazer  . KNEE ARTHROSCOPY Left 10/25/2006  . RIGHT HEART CATH N/A 07/24/2017  Procedure: RIGHT HEART CATH;  Surgeon: Jolaine Artist, MD;  Location: [place] CV LAB;  Service: Cardiovascular;  Laterality: N/A;  . RIGHT HEART CATHETERIZATION N/A 09/22/2013   Procedure: RIGHT HEART CATH;  Surgeon: Jolaine Artist, MD;  Location: [facility] CATH LAB;  Service: Cardiovascular;  Laterality: N/A;  . SHOULDER ARTHROSCOPY WITH OPEN ROTATOR CUFF REPAIR Right 03/14/2014   Procedure: RIGHT SHOULDER ARTHROSCOPY WITH BICEPS RELEASE, OPEN SUBSCAPULA REPAIR, OPEN SUPRASPINATUS REPAIR.;  Surgeon: Meredith Pel, MD;  Location: [place];  Service: Orthopedics;  Laterality: Right;  . TOE AMPUTATION Right 02/24/2018   GREAT TOE AND 2ND TOE AMPUTATION  . TUBAL LIGATION  1970's   Allergies  Allergen Reactions  . Codeine Nausea And Vomiting   No current  facility-administered medications on file prior to encounter.    Current Outpatient Medications on File Prior to Encounter  Medication Sig Dispense Refill  . allopurinol (ZYLOPRIM) 100 MG tablet Take 1 tablet (100 mg total) by mouth 2 (two) times daily. 60 tablet 12  . Ascorbic Acid (VITAMIN C) 250 MG CHEW Chew 1 tablet by mouth daily.    Marland Kitchen aspirin EC 81 MG tablet Take 81 mg by mouth daily.    . carvedilol (COREG) 12.5 MG tablet TAKE 1 TABLET BY MOUTH TWICE A DAY WITH MEALS 90 tablet 1  . cholecalciferol (VITAMIN D) 1000 units tablet Take 1,000 Units by mouth daily.    . clopidogrel (PLAVIX) 75 MG tablet TAKE 1 TABLET BY MOUTH DAILY WITH BREAKFAST. 90 tablet 2  . colchicine 0.6 MG tablet Take 1 tablet (0.6 mg total) by mouth daily. As needed for gout flare 60 tablet 12  . escitalopram (LEXAPRO) 20 MG tablet TAKE 1 TABLET BY MOUTH EVERY DAY 90 tablet 2  . ferrous sulfate 325 (65 FE) MG tablet Take 1 tablet (325 mg total) by mouth 2 (two) times daily with a meal. Reported on 08/21/2015 60 tablet 2  . Insulin Disposable Pump (OMNIPOD DASH 5 PACK PODS) MISC CHANGE EVERY 72 HOURS    . insulin NPH Human (HUMULIN N,NOVOLIN N) 100 UNIT/ML injection Inject into the skin daily as needed. Inject 10 units in the morning when blood sugar is 100-199. Inject 15 units in the morning if blood sugar is >200.    Marland Kitchen insulin regular (NOVOLIN R,HUMULIN R) 100 units/mL injection Inject 7-17 Units into the skin See admin instructions. With OMNIPOD Sliding scale 80-150 7 units, 150-200 9 units, 201-250 12 units, 251-300 14 units, 301-400 17 units.    Marland Kitchen levothyroxine (SYNTHROID, LEVOTHROID) 50 MCG tablet Take 1 tablet (50 mcg total) by mouth daily before breakfast. 90 tablet 1  . magnesium oxide (MAG-OX) 400 MG tablet TAKE 1 TABLET BY MOUTH EVERY DAY 90 tablet 3  . metolazone (ZAROXOLYN) 2.5 MG tablet Take 1 tablet (2.5 mg total) by mouth as directed. Monday and Friday 10 tablet 6  . nitroGLYCERIN (NITROSTAT) 0.4 MG SL  tablet PLACE 1 TABLET (0.4 MG TOTAL) UNDER THE TONGUE EVERY 5 (FIVE) MINUTES AS NEEDED FOR CHEST PAIN. 25 tablet 1  . nystatin cream (MYCOSTATIN) Apply 1 application topically 2 (two) times daily. 60 g 2  . ONETOUCH VERIO test strip     . pregabalin (LYRICA) 150 MG capsule Take 1 capsule (150 mg total) by mouth 2 (two) times daily. 60 capsule 2  . PROAIR HFA 108 (90 Base) MCG/ACT inhaler TAKE 2 PUFFS BY MOUTH EVERY 6 HOURS AS NEEDED FOR WHEEZE OR SHORTNESS OF BREATH 8.5 g 1  .  rosuvastatin (CRESTOR) 20 MG tablet TAKE 1 TABLET BY MOUTH EVERY DAY 90 tablet 3  . torsemide (DEMADEX) 20 MG tablet Take 60 mg by mouth 2 (two) times daily.    . traZODone (DESYREL) 100 MG tablet Take 1 tablet (100 mg total) by mouth at bedtime as needed for sleep. 90 tablet 1  . vitamin E (VITAMIN E) 1000 UNIT capsule Take 1,000 Units by mouth daily.     Social History   Socioeconomic History  . Marital status: Married    Spouse name: Not on file  . Number of children: 3  . Years of education: 12th  . Highest education level: Not on file  Occupational History    Employer: UNEMPLOYED  Social Needs  . Financial resource strain: Not on file  . Food insecurity    Worry: Not on file    Inability: Not on file  . Transportation needs    Medical: Not on file    Non-medical: Not on file  Tobacco Use  . Smoking status: Former Smoker    Packs/day: 3.00    Years: 32.00    Pack years: 96.00    Types: Cigarettes    Quit date: 10/24/1997    Years since quitting: 21.3  . Smokeless tobacco: Never Used  Substance and Sexual Activity  . Alcohol use: Not Currently    Comment: "might have 2-3 daiquiris in the summer"  . Drug use: No  . Sexual activity: Never  Lifestyle  . Physical activity    Days per week: Not on file    Minutes per session: Not on file  . Stress: Not on file  Relationships  . Social Herbalist on phone: Not on file    Gets together: Not on file    Attends religious service: Not on file     Active member of club or organization: Not on file    Attends meetings of clubs or organizations: Not on file    Relationship status: Not on file  . Intimate partner violence    Fear of current or ex partner: Not on file    Emotionally abused: Not on file    Physically abused: Not on file    Forced sexual activity: Not on file  Other Topics Concern  . Not on file  Social History Narrative   Pt lives at home with her spouse.   Caffeine Use- 3 sodas daily   Family History  Problem Relation Age of Onset  . Heart attack Mother 87    OBJECTIVE:  Vitals:   02/08/19 1030  BP: (!) 141/71  Pulse: 69  Resp: 18  Temp: 97.6 F (36.4 C)  SpO2: 95%    General appearance: ALERT; appears chronically ill, and uncomfortable, but nontoxic Head: NCAT ENT: Pupils appear consistricted, mildly reactive to light; EOM I grossly Lungs: Normal respiratory effort; CTAB CV: RRR Musculoskeletal: Back (exam limited due to patient sitting in wheelchair)  Inspection: Skin warm, dry, clear and intact without obvious erythema, effusion, or ecchymosis.  Palpation: TTP of distal t-spine and l-spine; significant TTP over RT paravertebral muscles ROM: FROM active and passive Strength: 5/5 shld abduction, 5/5 shld adduction, 5/5 elbow flexion, 5/5 elbow extension, 5/5 grip strength, 4+/5 hip flexion, 5/5 knee abduction, 5/5 knee adduction, 5/5 knee flexion, 5/5 knee extension, 5/5 dorsiflexion, 5/5 plantar flexion Skin: warm and dry Neurologic: Sitting in wheelchair during exam, ambulates from wheelchair to car; Sensation intact about the upper/ lower extremities Psychological: alert and cooperative;  normal mood and affect   ASSESSMENT & PLAN:  1. Acute right-sided low back pain without sciatica   2. Itching    Will avoid short course of prednisone/ steroid shot due to uncontrolled DM.  Patient hx also significant for HTN and CHF, so will avoid NSAIDs.  Will try low dose muscle relaxer at night and  lidoderm patches.  Instructed to go to the ED if symptoms did not improve or worsened over the next 24 hours.    Meds ordered this encounter  Medications  . ketorolac (TORADOL) 30 MG/ML injection 30 mg  . cyclobenzaprine (FLEXERIL) 5 MG tablet    Sig: Take 1 tablet (5 mg total) by mouth at bedtime.    Dispense:  12 tablet    Refill:  0    Order Specific Question:   Supervising Provider    Answer:   Raylene Everts [[id number]]  . lidocaine (LIDODERM) 5 %    Sig: Place 1 patch onto the skin daily. Remove & Discard patch within 12 hours or as directed by MD    Dispense:  30 patch    Refill:  0    Order Specific Question:   Supervising Provider    Answer:   Raylene Everts [[id number]]   Continue conservative management of rest, ice, heat, and gentle stretches Lidoderm patches prescribed.  Use as directed for back pain Take cyclobenzaprine at nighttime for symptomatic relief. Avoid driving or operating heavy machinery while using medication. Follow up with PCP if symptoms persist Return or go to the ER if you have any new or worsening symptoms (fever, chills, chest pain, abdominal pain, changes in bowel or bladder habits, pain radiating into lower legs, etc...)   Please follow up with PCP regarding itching.   Take benadryl or claritin/ zyrtec/ allegra during the day to help alleviate symptoms Continue with calamine lotion as needed Avoid scratching as this may cause secondary infection  Reviewed expectations re: course of current medical issues. Questions answered. Outlined signs and symptoms indicating need for more acute intervention. Patient verbalized understanding. After Visit Summary given.    Lestine Box, PA-C 02/08/19 1153

## 2019-02-08 NOTE — Discharge Instructions (Signed)
Continue conservative management of rest, ice, heat, and gentle stretches Lidoderm patches prescribed.  Use as directed for back pain Take cyclobenzaprine at nighttime for symptomatic relief. Avoid driving or operating heavy machinery while using medication. Follow up with PCP if symptoms persist Return or go to the ER if you have any new or worsening symptoms (fever, chills, chest pain, abdominal pain, changes in bowel or bladder habits, pain radiating into lower legs, etc...)   Please follow up with PCP regarding itching.   Take benadryl or claritin/ zyrtec/ allegra during the day to help alleviate symptoms Continue with calamine lotion as needed Avoid scratching as this may cause secondary infection

## 2019-02-10 ENCOUNTER — Other Ambulatory Visit: Payer: Self-pay | Admitting: Family Medicine

## 2019-02-15 ENCOUNTER — Other Ambulatory Visit: Payer: Self-pay

## 2019-02-15 ENCOUNTER — Ambulatory Visit (INDEPENDENT_AMBULATORY_CARE_PROVIDER_SITE_OTHER): Payer: PPO | Admitting: Family

## 2019-02-15 ENCOUNTER — Ambulatory Visit: Payer: PPO | Admitting: Family

## 2019-02-15 ENCOUNTER — Encounter: Payer: Self-pay | Admitting: Family

## 2019-02-15 VITALS — Ht 66.0 in | Wt 271.0 lb

## 2019-02-15 DIAGNOSIS — I872 Venous insufficiency (chronic) (peripheral): Secondary | ICD-10-CM | POA: Diagnosis not present

## 2019-02-15 DIAGNOSIS — Z794 Long term (current) use of insulin: Secondary | ICD-10-CM

## 2019-02-15 DIAGNOSIS — E1142 Type 2 diabetes mellitus with diabetic polyneuropathy: Secondary | ICD-10-CM

## 2019-02-15 DIAGNOSIS — B351 Tinea unguium: Secondary | ICD-10-CM

## 2019-02-15 NOTE — Progress Notes (Signed)
Office Visit Note   Patient: Susan [name]           Date of Birth: [birth year]           MRN: [id number] Visit Date: 02/15/2019              Requested by: Alycia Rossetti, MD [address] [place],  [place] [address] PCP: Alycia Rossetti, MD  Chief Complaint  Patient presents with  . Right Foot - Follow-up  . Left Foot - Follow-up      HPI: Patient is a 68 year old woman who presents in follow-up for left lower extremity.  Patient states she is developed a little bit of a pressure ulcer over the dorsum of her foot medially secondary to shoe wear.  She states that occasionally she has some bleeding.  She is wearing compression stockings states she still has swelling.  Assessment & Plan: Visit Diagnoses:  1. Type 2 diabetes mellitus with diabetic polyneuropathy, with long-term current use of insulin (HCC)   2. Venous insufficiency (chronic) (peripheral)   3. Onychomycosis     Plan: A felt pad was placed attached to the tongue of her shoe to unload pressure from the medial column ulcer.  She will continue with the compression stockings reevaluate in 4 weeks.  Follow-Up Instructions: Return in about 4 weeks (around 03/15/2019).   Ortho Exam  Patient is alert, oriented, no adenopathy, well-dressed, normal affect, normal respiratory effort. Examination patient has a palpable dorsalis pedis pulse she does have venous stasis swelling.  Brawny skin color changes but no open ulcers.  There is a superficial ulcer over the base of the first metatarsal medial cuneiform from bony prominence pressing on her shoe.  She has thickened discolored onychomycotic nails and the nails were trimmed x5 without complication.  No venous ulcers no plantar ulcers she does have some callus over the ball of the great toe and this was pared with a 10 blade knife.  Imaging: No results found. No images are attached to the encounter.  Labs: Lab Results  Component Value Date   HGBA1C 8.3 (H)  04/30/2018   HGBA1C 9.3 (H) 09/25/2017   HGBA1C 11.4 (H) 07/16/2017   CRP 8.4 (H) 03/05/2018   LABURIC 9.1 (H) 10/12/2018   LABURIC 9.0 (H) 03/06/2018   REPTSTATUS 03/21/2018 FINAL 03/18/2018   CULT (A) 03/18/2018    20,000 COLONIES/mL KLEBSIELLA PNEUMONIAE 20,000 COLONIES/mL ENTEROCOCCUS FAECALIS    LABORGA KLEBSIELLA PNEUMONIAE (A) 03/18/2018   LABORGA ENTEROCOCCUS FAECALIS (A) 03/18/2018     Lab Results  Component Value Date   ALBUMIN 3.0 (L) 03/19/2018   ALBUMIN 3.8 03/18/2018   ALBUMIN 2.6 (L) 02/14/2018   LABURIC 9.1 (H) 10/12/2018   LABURIC 9.0 (H) 03/06/2018    Lab Results  Component Value Date   MG 1.6 02/27/2017   MG 2.1 07/10/2015   MG 1.7 09/21/2013   No results found for: VD25OH  No results found for: PREALBUMIN CBC EXTENDED Latest Ref Rng & Units 04/30/2018 03/20/2018 03/19/2018  WBC 4.0 - 10.5 K/uL 6.0 5.4 6.2  RBC 3.87 - 5.11 MIL/uL 4.37 4.95 4.94  HGB 12.0 - 15.0 g/dL 10.6(L) 11.1(L) 11.3(L)  HCT 36.0 - 46.0 % 35.3(L) 37.8 36.7  PLT 150 - 400 K/uL 226 157 197  NEUTROABS 1.7 - 7.7 K/uL - - -  LYMPHSABS 0.7 - 4.0 K/uL - - -     Body mass index is 43.74 kg/m.  Orders:  No orders of the defined  types were placed in this encounter.  No orders of the defined types were placed in this encounter.    Procedures: No procedures performed  Clinical Data: No additional findings.  ROS:  All other systems negative, except as noted in the HPI. Review of Systems  Objective: Vital Signs: Ht 5\' 6"  (1.676 m)   Wt 271 lb (122.9 kg)   BMI 43.74 kg/m   Specialty Comments:  No specialty comments available.  PMFS History: Patient Active Problem List   Diagnosis Date Noted  . Onychomycosis 06/21/2018  . Pressure injury of skin 03/19/2018  . Syncope 03/18/2018  . Osteomyelitis of second toe of right foot ([place])   . Toe osteomyelitis, right ([place])   . 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 ([place])   . Acute on chronic diastolic  (congestive) heart failure ([place]) 02/13/2018  . PVD (peripheral vascular disease) ([place]) 10/26/2017  . Hypothyroid 07/27/2017  . PAH (pulmonary artery hypertension) ([place])   . Impaired ambulation 07/19/2017  . Hyperosmolar non-ketotic state in patient with type 2 diabetes mellitus ([place]) 07/15/2017  . Leg cramps 02/27/2017  . Peripheral edema 01/12/2017  . Diabetic neuropathy ([place]) 11/12/2016  . 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 mellitus ([place]) 10/24/2015  . Anemia 10/03/2015  . Generalized anxiety disorder 10/03/2015  . Insomnia 10/03/2015  . Diabetes mellitus type II, uncontrolled ([place]) 06/07/2015  . Chronic diastolic CHF (congestive heart failure) ([place]) 06/07/2015  . Non compliance with medical treatment 04/17/2014  . Rotator cuff tear 03/14/2014  . Morbid obesity ([place]) 09/23/2013  . Hypotension 12/25/2012  . Urinary incontinence   . MDD (major depressive disorder) 11/12/2010  . RBBB (right bundle branch block)   . CAD (coronary artery disease)   . Hyperlipemia 01/22/2009  . Essential hypertension 01/22/2009   Past Medical History:  Diagnosis Date  . Acute MI ([place]) 1999; 2007  . Anemia    hx  . Anginal pain ([place])   . Anxiety   . ARF (acute renal failure) ([place]) 06/2017   JAARS Kidney Asso  . Arthritis    "generalized" (03/15/2014)  . CAD (coronary artery disease)    MI in 2000 - MI  2007 - treated bare metal stent (no nuclear since then as 9/11)  . Carotid artery disease ([place])   . CHF (congestive heart failure) ([place])   . Chronic diastolic heart failure (HCC)    a) ECHO (08/2013) EF 55-60% and RV function nl b) RHC (08/2013) RA 4, RV 30/5/7, PA 25/10 (16), PCWP 7, Fick CO/CI 6.3/2.7, PVR 1.5 WU, PA 61 and 66%  . Daily headache    "~ every other day; since I fell in June" (03/15/2014)  . Depression   . Dyslipidemia   . Dyspnea   . Exertional shortness of breath   . HTN (hypertension)   . Hypothyroidism   . Neuropathy   . Obesity   . Osteoarthritis   . Peripheral  neuropathy   . PONV (postoperative nausea and vomiting)   . RBBB (right bundle branch block)    Old  . Stroke [facility])    mini strokes  . Syncope    likely due to low blood sugar  . Tachycardia    Sinus tachycardia  . Type II diabetes mellitus (HCC)    Type II  . Urinary incontinence   . Venous insufficiency     Family History  Problem Relation Age of Onset  . Heart attack Mother 20    Past Surgical History:  Procedure Laterality Date  . ABDOMINAL HYSTERECTOMY  1980's  . AMPUTATION Right 02/24/2018   Procedure: RIGHT FOOT GREAT TOE AND 2ND TOE AMPUTATION;  Surgeon: Newt Minion, MD;  Location: [place];  Service: Orthopedics;  Laterality: Right;  . AMPUTATION Right 04/30/2018   Procedure: RIGHT TRANSMETATARSAL AMPUTATION;  Surgeon: Newt Minion, MD;  Location: [place];  Service: Orthopedics;  Laterality: Right;  . CATARACT EXTRACTION, BILATERAL Bilateral ?2013  . COLONOSCOPY W/ POLYPECTOMY    . CORONARY ANGIOPLASTY WITH STENT PLACEMENT  1999; 2007   "1 + 1"  . EYE SURGERY Bilateral    lazer  . KNEE ARTHROSCOPY Left 10/25/2006  . RIGHT HEART CATH N/A 07/24/2017   Procedure: RIGHT HEART CATH;  Surgeon: Jolaine Artist, MD;  Location: [place] CV LAB;  Service: Cardiovascular;  Laterality: N/A;  . RIGHT HEART CATHETERIZATION N/A 09/22/2013   Procedure: RIGHT HEART CATH;  Surgeon: Jolaine Artist, MD;  Location: [facility] CATH LAB;  Service: Cardiovascular;  Laterality: N/A;  . SHOULDER ARTHROSCOPY WITH OPEN ROTATOR CUFF REPAIR Right 03/14/2014   Procedure: RIGHT SHOULDER ARTHROSCOPY WITH BICEPS RELEASE, OPEN SUBSCAPULA REPAIR, OPEN SUPRASPINATUS REPAIR.;  Surgeon: Meredith Pel, MD;  Location: [place];  Service: Orthopedics;  Laterality: Right;  . TOE AMPUTATION Right 02/24/2018   GREAT TOE AND 2ND TOE AMPUTATION  . TUBAL LIGATION  1970's   Social History   Occupational History    Employer: UNEMPLOYED  Tobacco Use  . Smoking status: Former Smoker    Packs/day: 3.00     Years: 32.00    Pack years: 96.00    Types: Cigarettes    Quit date: 10/24/1997    Years since quitting: 21.3  . Smokeless tobacco: Never Used  Substance and Sexual Activity  . Alcohol use: Not Currently    Comment: "might have 2-3 daiquiris in the summer"  . Drug use: No  . Sexual activity: Never

## 2019-02-16 ENCOUNTER — Ambulatory Visit (INDEPENDENT_AMBULATORY_CARE_PROVIDER_SITE_OTHER): Payer: PPO | Admitting: Family Medicine

## 2019-02-16 ENCOUNTER — Encounter: Payer: Self-pay | Admitting: Family Medicine

## 2019-02-16 ENCOUNTER — Other Ambulatory Visit: Payer: Self-pay

## 2019-02-16 VITALS — BP 128/84 | HR 82 | Temp 98.1°F | Resp 14 | Ht 66.0 in | Wt 272.0 lb

## 2019-02-16 DIAGNOSIS — M545 Low back pain, unspecified: Secondary | ICD-10-CM

## 2019-02-16 DIAGNOSIS — B86 Scabies: Secondary | ICD-10-CM

## 2019-02-16 MED ORDER — TRAMADOL HCL 50 MG PO TABS: 50.0000 mg | ORAL_TABLET | Freq: Three times a day (TID) | ORAL | 0 refills | Status: AC | PRN

## 2019-02-16 MED ORDER — PERMETHRIN 5 % EX CREA: 1.0000 "application " | TOPICAL_CREAM | Freq: Once | CUTANEOUS | 1 refills | Status: AC

## 2019-02-16 MED ORDER — TIZANIDINE HCL 4 MG PO TABS: 4.0000 mg | ORAL_TABLET | Freq: Four times a day (QID) | ORAL | 0 refills | Status: DC | PRN

## 2019-02-16 NOTE — Patient Instructions (Addendum)
Use permethrin for possible scabies- apply from neck down, rinse in the morning Take the zanaflex muscle relaxer  Ultram for pain F/U as previous

## 2019-02-16 NOTE — Progress Notes (Signed)
   Subjective:    Patient ID: Susan [name], female    DOB: 1951-05-04, 68 y.o.   MRN: [id number]  Patient presents for R Sided Back Pain (x5 weeks- r side pain- constant pain, intermittently sharp- decreased ROM)   Pt here with back pain for the past month. Right lower back that radiates around to her lower abdomen, no pain down buttocks or into legs. This started after she was helping mow her yard, had difficulty getting onto the riding mower, and she was bouncing around a lot   No change in bowel or bladder Seen in UC on 02/08/19 given toradol shot and lidoderm patches Still has the same pain as before Seems UC also tried to send in flexeril this was not covered, then robaxin sent , never received this    Husband has been itching all over, she has had itching all over as well for the past few weeks, not sure if pets brought it in , worse on back shoulder, arms, has some itching in web spaces of hands   Review Of Systems:  GEN- denies fatigue, fever, weight loss,weakness, recent illness HEENT- denies eye drainage, change in vision, nasal discharge, CVS- denies chest pain, palpitations RESP- denies SOB, cough, wheeze ABD- denies N/V, change in stools, abd pain GU- denies dysuria, hematuria, dribbling, incontinence MSK- +joint pain, muscle aches, injury Neuro- denies headache, dizziness, syncope, seizure activity       Objective:    BP 128/84   Pulse 82   Temp 98.1 F (36.7 C) (Oral)   Resp 14   Ht 5\' 6"  (1.676 m)   Wt 272 lb (123.4 kg)   SpO2 98%   BMI 43.90 kg/m  GEN- NAD, alert and oriented x3 HEENT- PERRL, EOMI, non injected sclera, pink conjunctiva, MMM, oropharynx clear, CVS- RRR, no murmur RESP-CTAB ABD-NABS,soft,NT,ND EXT- chronic venous stasis changes 1+ pitting edema bilat , partial right foot amputation, no lesiosn on left foot  MSK-TTP lumbar spine and paraspinals, +spasm, fair ROM SPINE, HIPS, BACK, examined in chair, neg SLR Skin- excoriations on shoulder  back, few scabs on arms, ecchymotic lesions/brusises on bilat forearms, no lesions on palms Pulses- Radial 2+, DP- diminished Left foot        Assessment & Plan:      Problem List Items Addressed This Visit    None    Visit Diagnoses    Acute right-sided low back pain without sciatica    -  Primary   based on history and other imaging has DDD/OA in back, no red flags, given zanaflex and ultram for pain   Relevant Medications   tiZANidine (ZANAFLEX) 4 MG tablet   traMADol (ULTRAM) 50 MG tablet   Scabies       Treat for possible scabies with permethrin, can also take oral anti-histamine      Note: This dictation was prepared with Dragon dictation along with smaller phrase technology. Any transcriptional errors that result from this process are unintentional.

## 2019-02-17 ENCOUNTER — Ambulatory Visit: Payer: Self-pay | Admitting: Pharmacist

## 2019-02-17 ENCOUNTER — Other Ambulatory Visit: Payer: Self-pay | Admitting: Pharmacist

## 2019-02-17 NOTE — Patient Outreach (Signed)
[name]) Care Management  02/17/2019  Susan [name] [birth year] [id number]   Patient was called to follow up. HIPAA identifiers were obtained. HIPAA identifiers were obtained. Patient reported she was having a lot of back pain. She reported the pain as 10/10 pain when she tries to move.  Acetaminophen helped some. Heat packs have not really helped nor have the "cold packs".  She went to see her PCP about her back pain. Patient was started on Tizanidine and Tramadol.   Patient reported her blood sugar was 364 mg/dl yesterday before dinner.    HgA1c 10.1%  Patient continues to have dietary indescretions and was very tearful while we were on the phone. She reported feeling sad because she cannot get up and move the way she would like to without being in pain.  Plan: Call patient back in 2-3 weeks for followup   [name], PharmD, [place] Clinical Pharmacist [phone]

## 2019-02-18 ENCOUNTER — Ambulatory Visit: Payer: Self-pay | Admitting: Pharmacist

## 2019-02-24 DIAGNOSIS — D631 Anemia in chronic kidney disease: Secondary | ICD-10-CM | POA: Diagnosis not present

## 2019-02-24 DIAGNOSIS — M109 Gout, unspecified: Secondary | ICD-10-CM | POA: Diagnosis not present

## 2019-02-24 DIAGNOSIS — E1122 Type 2 diabetes mellitus with diabetic chronic kidney disease: Secondary | ICD-10-CM | POA: Diagnosis not present

## 2019-02-24 DIAGNOSIS — I129 Hypertensive chronic kidney disease with stage 1 through stage 4 chronic kidney disease, or unspecified chronic kidney disease: Secondary | ICD-10-CM | POA: Diagnosis not present

## 2019-02-24 DIAGNOSIS — N184 Chronic kidney disease, stage 4 (severe): Secondary | ICD-10-CM | POA: Diagnosis not present

## 2019-02-24 DIAGNOSIS — N189 Chronic kidney disease, unspecified: Secondary | ICD-10-CM | POA: Diagnosis not present

## 2019-02-24 DIAGNOSIS — I509 Heart failure, unspecified: Secondary | ICD-10-CM | POA: Diagnosis not present

## 2019-02-24 DIAGNOSIS — N390 Urinary tract infection, site not specified: Secondary | ICD-10-CM | POA: Diagnosis not present

## 2019-02-24 DIAGNOSIS — G4733 Obstructive sleep apnea (adult) (pediatric): Secondary | ICD-10-CM | POA: Diagnosis not present

## 2019-02-24 DIAGNOSIS — Z1159 Encounter for screening for other viral diseases: Secondary | ICD-10-CM | POA: Diagnosis not present

## 2019-03-03 ENCOUNTER — Encounter: Payer: PPO | Attending: Family Medicine | Admitting: *Deleted

## 2019-03-03 ENCOUNTER — Other Ambulatory Visit: Payer: Self-pay

## 2019-03-03 DIAGNOSIS — E1165 Type 2 diabetes mellitus with hyperglycemia: Secondary | ICD-10-CM | POA: Insufficient documentation

## 2019-03-03 NOTE — Patient Instructions (Signed)
Plan:  Aim for 2 Carb Choices per meal (30 grams) +/- 1 either way  Aim for 0-1 Carbs per snack if hungry  Check out some carbonated water drinks like "Bubly" and "Hint" that don't have the aftertaste of sweeteners Include protein in moderation with your meals and snacks Consider  increasing your activity level by doing some chair exercises for 5 minutes a few times daily (if OK with your cardiac doctor) as tolerated Continue checking BG at alternate times per day  Continue taking medication as directed by MD  Ask Dr. [name] about other options for delivering your insulin besides Omnipod only because of it's expense. You have another expense that helps you sleep through the night and you have mentioned you can't afford both.

## 2019-03-04 NOTE — Progress Notes (Signed)
Diabetes Self-Management Education  Visit Type: Follow-up  Appt. Start Time: 1400 Appt. End Time: 7494  03/04/2019  Susan [name], identified by name and date of birth, is a 68 y.o. female with a diagnosis of Diabetes:  . She expresses frustration with her multiple medical problems, very little family support and limited income. But she expresses willingness to learn what her options might be to help move her into better BG control. With her toes amputated, her balance is poor and she cannot stand for very long (to cook, etc).  She states she urinates all night long and has been using a "Purewick" device that basically fills with urine while she is sleeping and she empties it mid sleep time and again in the AM. The cost to her is about $200 a month and this is not a cost she can continue affording along with other medical costs. She feels she will need to make a choice between paying for Omnipod or this and what her options might be. I suggested she discuss with Dr. [name] to see if her insulin delivery could be with injections and save the Omnipod money for this device that allows her to get some sleep through the night?   ASSESSMENT  There were no vitals taken for this visit. There is no height or weight on file to calculate BMI.  Diabetes Self-Management Education - 03/03/19 1413      Visit Information   Visit Type  Follow-up      Health Coping   How would you rate your overall health?  Very Poor  (Pended)       Psychosocial Assessment   Patient Belief/Attitude about Diabetes  Defeat/Burnout  (Pended)     Self-care barriers  Debilitated state due to current medical condition  (Pended)     Other persons present  Patient  (Pended)     Patient Concerns  Nutrition/Meal planning;Glycemic Control  (Pended)     Special Needs  Simplified materials  (Pended)     Preferred Learning Style  Auditory;Visual  (Pended)     How often do you need to have someone help you when you read instructions,  pamphlets, or other written materials from your doctor or pharmacy?  4 - Often  (Pended)     What is the last grade level you completed in school?  --  (Pended)    12     Complications   Last HgB A1C per patient/outside source  --  (Pended)    11.7   How often do you check your blood sugar?  3-4 times/day  (Pended)     Have you had a dilated eye exam in the past 12 months?  Yes  (Pended)     Have you had a dental exam in the past 12 months?  No  (Pended)     Are you checking your feet?  Yes  (Pended)       Dietary Intake   Breakfast  around 10 AM scrambled eggs OR Glucerna OR biscuit with egg and sausage if running errands (about 2 days a week)    Snack (morning)  no    Lunch  skips most days with late breakfast    Dinner  5 - 6 PM: meat, potatoes, vegetables, OR grilled chicken salad meal    Beverage(s)  water, regular Pepsi, sweet tea      Subsequent Visit   Since your last visit have you continued or begun to take your medications as prescribed?  Yes  (Pended)     Since your last visit, are you checking your blood glucose at least once a day?  Yes  (Pended)        Individualized Plan for Diabetes Self-Management Training:   Learning Objective:  Patient will have a greater understanding of diabetes self-management. Patient education plan is to attend individual and/or group sessions per assessed needs and concerns.   Plan:   Patient Instructions  Plan:  Aim for 2 Carb Choices per meal (30 grams) +/- 1 either way  Aim for 0-1 Carbs per snack if hungry  Check out some carbonated water drinks like "Bubly" and "Hint" that don't have the aftertaste of sweeteners Include protein in moderation with your meals and snacks Consider  increasing your activity level by doing some chair exercises for 5 minutes a few times daily (if OK with your cardiac doctor) as tolerated Continue checking BG at alternate times per day  Continue taking medication as directed by MD  Ask Dr. [name] about  other options for delivering your insulin besides Omnipod only because of it's expense. You have another expense that helps you sleep through the night and you have mentioned you can't afford both.  Expected Outcomes:    Demonstrated interest in learning. Expect positive outcomes  Education material provided: Food label handouts, A1C conversion sheet, Meal plan card and Carbohydrate counting sheet  If problems or questions, patient to contact team via:  Phone  Future DSME appointment:   April 12, 2019

## 2019-03-10 ENCOUNTER — Other Ambulatory Visit: Payer: Self-pay | Admitting: Pharmacist

## 2019-03-10 ENCOUNTER — Ambulatory Visit: Payer: Self-pay | Admitting: Pharmacist

## 2019-03-10 NOTE — Patient Outreach (Signed)
[facility]) Care Management  03/10/2019  Susan [name] [birth year] [id number]   Patient was called for CSNP follow up and to follow up on her blood sugars. Unfortunately, patient did not answer the phone. HIPAA compliant message was left on her voice mail.  Plan: Call patient back in 2-3 weeks.  [name], PharmD, [place] Clinical Pharmacist [phone]

## 2019-03-11 ENCOUNTER — Other Ambulatory Visit: Payer: Self-pay

## 2019-03-11 ENCOUNTER — Emergency Department (HOSPITAL_COMMUNITY): Payer: PPO

## 2019-03-11 ENCOUNTER — Inpatient Hospital Stay (HOSPITAL_COMMUNITY)
Admission: EM | Admit: 2019-03-11 | Discharge: 2019-03-14 | DRG: 378 | Disposition: A | Discharge status: Home Health | Payer: PPO | Attending: Internal Medicine | Admitting: Internal Medicine

## 2019-03-11 DIAGNOSIS — Z8673 Personal history of transient ischemic attack (TIA), and cerebral infarction without residual deficits: Secondary | ICD-10-CM

## 2019-03-11 DIAGNOSIS — N189 Chronic kidney disease, unspecified: Secondary | ICD-10-CM | POA: Diagnosis not present

## 2019-03-11 DIAGNOSIS — Z885 Allergy status to narcotic agent status: Secondary | ICD-10-CM

## 2019-03-11 DIAGNOSIS — Z800 Family history of malignant neoplasm of digestive organs: Secondary | ICD-10-CM

## 2019-03-11 DIAGNOSIS — K2970 Gastritis, unspecified, without bleeding: Secondary | ICD-10-CM | POA: Diagnosis present

## 2019-03-11 DIAGNOSIS — Z9181 History of falling: Secondary | ICD-10-CM

## 2019-03-11 DIAGNOSIS — Z6841 Body Mass Index (BMI) 40.0 and over, adult: Secondary | ICD-10-CM

## 2019-03-11 DIAGNOSIS — D123 Benign neoplasm of transverse colon: Secondary | ICD-10-CM

## 2019-03-11 DIAGNOSIS — B373 Candidiasis of vulva and vagina: Secondary | ICD-10-CM | POA: Diagnosis present

## 2019-03-11 DIAGNOSIS — K644 Residual hemorrhoidal skin tags: Secondary | ICD-10-CM | POA: Diagnosis present

## 2019-03-11 DIAGNOSIS — K921 Melena: Secondary | ICD-10-CM | POA: Diagnosis present

## 2019-03-11 DIAGNOSIS — K317 Polyp of stomach and duodenum: Secondary | ICD-10-CM | POA: Diagnosis present

## 2019-03-11 DIAGNOSIS — E1165 Type 2 diabetes mellitus with hyperglycemia: Secondary | ICD-10-CM | POA: Diagnosis present

## 2019-03-11 DIAGNOSIS — Z79899 Other long term (current) drug therapy: Secondary | ICD-10-CM

## 2019-03-11 DIAGNOSIS — D62 Acute posthemorrhagic anemia: Secondary | ICD-10-CM | POA: Diagnosis present

## 2019-03-11 DIAGNOSIS — I872 Venous insufficiency (chronic) (peripheral): Secondary | ICD-10-CM | POA: Diagnosis present

## 2019-03-11 DIAGNOSIS — K5730 Diverticulosis of large intestine without perforation or abscess without bleeding: Secondary | ICD-10-CM | POA: Diagnosis present

## 2019-03-11 DIAGNOSIS — N179 Acute kidney failure, unspecified: Secondary | ICD-10-CM | POA: Diagnosis present

## 2019-03-11 DIAGNOSIS — I4581 Long QT syndrome: Secondary | ICD-10-CM | POA: Diagnosis present

## 2019-03-11 DIAGNOSIS — Z87891 Personal history of nicotine dependence: Secondary | ICD-10-CM

## 2019-03-11 DIAGNOSIS — Z9641 Presence of insulin pump (external) (internal): Secondary | ICD-10-CM | POA: Diagnosis present

## 2019-03-11 DIAGNOSIS — K625 Hemorrhage of anus and rectum: Secondary | ICD-10-CM

## 2019-03-11 DIAGNOSIS — M159 Polyosteoarthritis, unspecified: Secondary | ICD-10-CM | POA: Diagnosis present

## 2019-03-11 DIAGNOSIS — D125 Benign neoplasm of sigmoid colon: Secondary | ICD-10-CM | POA: Diagnosis present

## 2019-03-11 DIAGNOSIS — I10 Essential (primary) hypertension: Secondary | ICD-10-CM | POA: Diagnosis not present

## 2019-03-11 DIAGNOSIS — E875 Hyperkalemia: Secondary | ICD-10-CM | POA: Diagnosis present

## 2019-03-11 DIAGNOSIS — Z794 Long term (current) use of insulin: Secondary | ICD-10-CM

## 2019-03-11 DIAGNOSIS — E785 Hyperlipidemia, unspecified: Secondary | ICD-10-CM | POA: Diagnosis present

## 2019-03-11 DIAGNOSIS — Z79890 Hormone replacement therapy (postmenopausal): Secondary | ICD-10-CM

## 2019-03-11 DIAGNOSIS — K648 Other hemorrhoids: Secondary | ICD-10-CM | POA: Diagnosis present

## 2019-03-11 DIAGNOSIS — E1122 Type 2 diabetes mellitus with diabetic chronic kidney disease: Secondary | ICD-10-CM | POA: Diagnosis present

## 2019-03-11 DIAGNOSIS — I130 Hypertensive heart and chronic kidney disease with heart failure and stage 1 through stage 4 chronic kidney disease, or unspecified chronic kidney disease: Secondary | ICD-10-CM | POA: Diagnosis present

## 2019-03-11 DIAGNOSIS — K922 Gastrointestinal hemorrhage, unspecified: Secondary | ICD-10-CM | POA: Diagnosis not present

## 2019-03-11 DIAGNOSIS — I252 Old myocardial infarction: Secondary | ICD-10-CM | POA: Diagnosis not present

## 2019-03-11 DIAGNOSIS — Z20828 Contact with and (suspected) exposure to other viral communicable diseases: Secondary | ICD-10-CM | POA: Diagnosis present

## 2019-03-11 DIAGNOSIS — Z7982 Long term (current) use of aspirin: Secondary | ICD-10-CM

## 2019-03-11 DIAGNOSIS — D122 Benign neoplasm of ascending colon: Secondary | ICD-10-CM

## 2019-03-11 DIAGNOSIS — D509 Iron deficiency anemia, unspecified: Secondary | ICD-10-CM | POA: Diagnosis not present

## 2019-03-11 DIAGNOSIS — Z9842 Cataract extraction status, left eye: Secondary | ICD-10-CM

## 2019-03-11 DIAGNOSIS — I4519 Other right bundle-branch block: Secondary | ICD-10-CM | POA: Diagnosis present

## 2019-03-11 DIAGNOSIS — R079 Chest pain, unspecified: Secondary | ICD-10-CM | POA: Diagnosis present

## 2019-03-11 DIAGNOSIS — N1832 Chronic kidney disease, stage 3b: Secondary | ICD-10-CM | POA: Diagnosis present

## 2019-03-11 DIAGNOSIS — E039 Hypothyroidism, unspecified: Secondary | ICD-10-CM | POA: Diagnosis present

## 2019-03-11 DIAGNOSIS — E1142 Type 2 diabetes mellitus with diabetic polyneuropathy: Secondary | ICD-10-CM | POA: Diagnosis present

## 2019-03-11 DIAGNOSIS — Z90710 Acquired absence of both cervix and uterus: Secondary | ICD-10-CM

## 2019-03-11 DIAGNOSIS — Z89431 Acquired absence of right foot: Secondary | ICD-10-CM

## 2019-03-11 DIAGNOSIS — F329 Major depressive disorder, single episode, unspecified: Secondary | ICD-10-CM | POA: Diagnosis present

## 2019-03-11 DIAGNOSIS — D124 Benign neoplasm of descending colon: Secondary | ICD-10-CM | POA: Diagnosis present

## 2019-03-11 DIAGNOSIS — R9431 Abnormal electrocardiogram [ECG] [EKG]: Secondary | ICD-10-CM

## 2019-03-11 DIAGNOSIS — I2510 Atherosclerotic heart disease of native coronary artery without angina pectoris: Secondary | ICD-10-CM | POA: Diagnosis present

## 2019-03-11 DIAGNOSIS — Z955 Presence of coronary angioplasty implant and graft: Secondary | ICD-10-CM

## 2019-03-11 DIAGNOSIS — Z9841 Cataract extraction status, right eye: Secondary | ICD-10-CM

## 2019-03-11 DIAGNOSIS — Z87440 Personal history of urinary (tract) infections: Secondary | ICD-10-CM

## 2019-03-11 DIAGNOSIS — Z8719 Personal history of other diseases of the digestive system: Secondary | ICD-10-CM

## 2019-03-11 DIAGNOSIS — F411 Generalized anxiety disorder: Secondary | ICD-10-CM | POA: Diagnosis present

## 2019-03-11 DIAGNOSIS — I5032 Chronic diastolic (congestive) heart failure: Secondary | ICD-10-CM | POA: Diagnosis present

## 2019-03-11 DIAGNOSIS — D126 Benign neoplasm of colon, unspecified: Secondary | ICD-10-CM | POA: Diagnosis not present

## 2019-03-11 DIAGNOSIS — Z86010 Personal history of colonic polyps: Secondary | ICD-10-CM | POA: Diagnosis not present

## 2019-03-11 DIAGNOSIS — K635 Polyp of colon: Secondary | ICD-10-CM | POA: Diagnosis not present

## 2019-03-11 DIAGNOSIS — Z8249 Family history of ischemic heart disease and other diseases of the circulatory system: Secondary | ICD-10-CM

## 2019-03-11 DIAGNOSIS — K3189 Other diseases of stomach and duodenum: Secondary | ICD-10-CM | POA: Diagnosis not present

## 2019-03-11 DIAGNOSIS — K8020 Calculus of gallbladder without cholecystitis without obstruction: Secondary | ICD-10-CM | POA: Diagnosis not present

## 2019-03-11 DIAGNOSIS — Z7902 Long term (current) use of antithrombotics/antiplatelets: Secondary | ICD-10-CM

## 2019-03-11 LAB — URINALYSIS, ROUTINE W REFLEX MICROSCOPIC
Bilirubin Urine: NEGATIVE
Glucose, UA: >=500 mg/dL — AB
Ketones, ur: NEGATIVE mg/dL
Leukocytes,Ua: NEGATIVE
Nitrite: NEGATIVE
Protein, ur: 30 mg/dL — AB
Specific Gravity, Urine: 1.008 (ref 1.005–1.030)
pH: 5 (ref 5.0–8.0)

## 2019-03-11 LAB — CBC
HCT: 35.3 % — ABNORMAL LOW (ref 36.0–46.0)
Hemoglobin: 10.8 g/dL — ABNORMAL LOW (ref 12.0–15.0)
MCH: 26 pg (ref 26.0–34.0)
MCHC: 30.6 g/dL (ref 30.0–36.0)
MCV: 84.9 fL (ref 80.0–100.0)
Platelets: 231 10*3/uL (ref 150–400)
RBC: 4.16 MIL/uL (ref 3.87–5.11)
RDW: 15.7 % — ABNORMAL HIGH (ref 11.5–15.5)
WBC: 12.1 10*3/uL — ABNORMAL HIGH (ref 4.0–10.5)
nRBC: 0 % (ref 0.0–0.2)

## 2019-03-11 LAB — COMPREHENSIVE METABOLIC PANEL
ALT: 17 U/L (ref 0–44)
AST: 17 U/L (ref 15–41)
Albumin: 3 g/dL — ABNORMAL LOW (ref 3.5–5.0)
Alkaline Phosphatase: 125 U/L (ref 38–126)
Anion gap: 13 (ref 5–15)
BUN: 66 mg/dL — ABNORMAL HIGH (ref 8–23)
CO2: 24 mmol/L (ref 22–32)
Calcium: 8.6 mg/dL — ABNORMAL LOW (ref 8.9–10.3)
Chloride: 97 mmol/L — ABNORMAL LOW (ref 98–111)
Creatinine, Ser: 2.9 mg/dL — ABNORMAL HIGH (ref 0.44–1.00)
GFR calc Af Amer: 18 mL/min — ABNORMAL LOW (ref >60)
GFR calc non Af Amer: 16 mL/min — ABNORMAL LOW (ref >60)
Glucose, Bld: 413 mg/dL — ABNORMAL HIGH (ref 70–99)
Potassium: 5.2 mmol/L — ABNORMAL HIGH (ref 3.5–5.1)
Sodium: 134 mmol/L — ABNORMAL LOW (ref 135–145)
Total Bilirubin: 0.3 mg/dL (ref 0.3–1.2)
Total Protein: 5.7 g/dL — ABNORMAL LOW (ref 6.5–8.1)

## 2019-03-11 LAB — CBG MONITORING, ED: Glucose-Capillary: 334 mg/dL — ABNORMAL HIGH (ref 70–99)

## 2019-03-11 LAB — BASIC METABOLIC PANEL
Anion gap: 11 (ref 5–15)
BUN: 68 mg/dL — ABNORMAL HIGH (ref 8–23)
CO2: 25 mmol/L (ref 22–32)
Calcium: 8.5 mg/dL — ABNORMAL LOW (ref 8.9–10.3)
Chloride: 99 mmol/L (ref 98–111)
Creatinine, Ser: 2.14 mg/dL — ABNORMAL HIGH (ref 0.44–1.00)
GFR calc Af Amer: 27 mL/min — ABNORMAL LOW (ref >60)
GFR calc non Af Amer: 23 mL/min — ABNORMAL LOW (ref >60)
Glucose, Bld: 306 mg/dL — ABNORMAL HIGH (ref 70–99)
Potassium: 4.1 mmol/L (ref 3.5–5.1)
Sodium: 135 mmol/L (ref 135–145)

## 2019-03-11 LAB — TROPONIN I (HIGH SENSITIVITY)
Troponin I (High Sensitivity): 13 ng/L (ref <18)
Troponin I (High Sensitivity): 11 ng/L (ref <18)

## 2019-03-11 LAB — HEMOGLOBIN AND HEMATOCRIT, BLOOD
HCT: 31.3 % — ABNORMAL LOW (ref 36.0–46.0)
Hemoglobin: 10.1 g/dL — ABNORMAL LOW (ref 12.0–15.0)

## 2019-03-11 LAB — HIV ANTIBODY (ROUTINE TESTING W REFLEX): HIV Screen 4th Generation wRfx: NONREACTIVE

## 2019-03-11 LAB — HEMOGLOBIN A1C
Hgb A1c MFr Bld: 12 % — ABNORMAL HIGH (ref 4.8–5.6)
Mean Plasma Glucose: 297.7 mg/dL

## 2019-03-11 LAB — GLUCOSE, CAPILLARY
Glucose-Capillary: 315 mg/dL — ABNORMAL HIGH (ref 70–99)
Glucose-Capillary: 201 mg/dL — ABNORMAL HIGH (ref 70–99)

## 2019-03-11 LAB — SARS CORONAVIRUS 2 (TAT 6-24 HRS): SARS Coronavirus 2: NEGATIVE

## 2019-03-11 LAB — POC OCCULT BLOOD, ED: Fecal Occult Bld: POSITIVE — AB

## 2019-03-11 MED ORDER — ONDANSETRON HCL 4 MG PO TABS: 4.0000 mg | ORAL_TABLET | Freq: Four times a day (QID) | ORAL | Status: DC | PRN

## 2019-03-11 MED ORDER — ROSUVASTATIN CALCIUM 20 MG PO TABS
20.0000 mg | ORAL_TABLET | Freq: Every morning | ORAL | Status: DC
  Administered 2019-03-12 – 2019-03-14 (×3): 20 mg via ORAL
  Filled 2019-03-11 (×3): qty 1

## 2019-03-11 MED ORDER — CARVEDILOL 12.5 MG PO TABS
12.5000 mg | ORAL_TABLET | Freq: Two times a day (BID) | ORAL | Status: DC
  Administered 2019-03-12 – 2019-03-14 (×6): 12.5 mg via ORAL
  Filled 2019-03-11 (×6): qty 1

## 2019-03-11 MED ORDER — MAGNESIUM OXIDE 400 (241.3 MG) MG PO TABS
400.0000 mg | ORAL_TABLET | Freq: Every morning | ORAL | Status: DC
  Administered 2019-03-12 – 2019-03-14 (×3): 400 mg via ORAL
  Filled 2019-03-11 (×3): qty 1

## 2019-03-11 MED ORDER — FERROUS SULFATE 325 (65 FE) MG PO TABS
325.0000 mg | ORAL_TABLET | Freq: Two times a day (BID) | ORAL | Status: DC
  Administered 2019-03-12 – 2019-03-14 (×4): 325 mg via ORAL
  Filled 2019-03-11 (×5): qty 1

## 2019-03-11 MED ORDER — ACETAMINOPHEN 325 MG PO TABS
650.0000 mg | ORAL_TABLET | Freq: Four times a day (QID) | ORAL | Status: DC | PRN
  Administered 2019-03-11 – 2019-03-12 (×2): 650 mg via ORAL
  Filled 2019-03-11 (×2): qty 2

## 2019-03-11 MED ORDER — LORAZEPAM 2 MG/ML IJ SOLN
1.0000 mg | Freq: Once | INTRAMUSCULAR | Status: AC
  Administered 2019-03-11: 1 mg via INTRAVENOUS
  Filled 2019-03-11: qty 1

## 2019-03-11 MED ORDER — NITROGLYCERIN 0.4 MG SL SUBL: 0.4000 mg | SUBLINGUAL_TABLET | SUBLINGUAL | Status: DC | PRN

## 2019-03-11 MED ORDER — ONDANSETRON HCL 4 MG/2ML IJ SOLN: 4.0000 mg | Freq: Four times a day (QID) | INTRAMUSCULAR | Status: DC | PRN

## 2019-03-11 MED ORDER — ALBUTEROL SULFATE (2.5 MG/3ML) 0.083% IN NEBU: 2.5000 mg | INHALATION_SOLUTION | Freq: Four times a day (QID) | RESPIRATORY_TRACT | Status: DC | PRN

## 2019-03-11 MED ORDER — PREGABALIN 75 MG PO CAPS
150.0000 mg | ORAL_CAPSULE | Freq: Two times a day (BID) | ORAL | Status: DC
  Administered 2019-03-11 – 2019-03-14 (×6): 150 mg via ORAL
  Filled 2019-03-11 (×6): qty 2

## 2019-03-11 MED ORDER — INSULIN ASPART 100 UNIT/ML ~~LOC~~ SOLN
0.0000 [IU] | Freq: Three times a day (TID) | SUBCUTANEOUS | Status: DC
  Administered 2019-03-11 – 2019-03-12 (×2): 11 [IU] via SUBCUTANEOUS
  Administered 2019-03-13: 5 [IU] via SUBCUTANEOUS
  Administered 2019-03-13 (×2): 8 [IU] via SUBCUTANEOUS
  Administered 2019-03-14: 11 [IU] via SUBCUTANEOUS
  Administered 2019-03-14 (×2): 5 [IU] via SUBCUTANEOUS

## 2019-03-11 MED ORDER — ACETAMINOPHEN 650 MG RE SUPP: 650.0000 mg | Freq: Four times a day (QID) | RECTAL | Status: DC | PRN

## 2019-03-11 MED ORDER — INSULIN PUMP
Freq: Three times a day (TID) | SUBCUTANEOUS | Status: DC
  Administered 2019-03-11 (×2): via SUBCUTANEOUS
  Filled 2019-03-11: qty 1

## 2019-03-11 MED ORDER — LEVOTHYROXINE SODIUM 50 MCG PO TABS
50.0000 ug | ORAL_TABLET | Freq: Every day | ORAL | Status: DC
  Administered 2019-03-12 – 2019-03-14 (×3): 50 ug via ORAL
  Filled 2019-03-11 (×3): qty 1

## 2019-03-11 MED ORDER — FLUCONAZOLE 150 MG PO TABS
150.0000 mg | ORAL_TABLET | Freq: Once | ORAL | Status: AC
  Administered 2019-03-11: 150 mg via ORAL
  Filled 2019-03-11: qty 1

## 2019-03-11 MED ORDER — SODIUM CHLORIDE 0.9 % IV SOLN
INTRAVENOUS | Status: DC
  Administered 2019-03-11: 16:00:00 via INTRAVENOUS

## 2019-03-11 MED ORDER — INSULIN ASPART 100 UNIT/ML ~~LOC~~ SOLN
0.0000 [IU] | Freq: Every day | SUBCUTANEOUS | Status: DC
  Administered 2019-03-11: 2 [IU] via SUBCUTANEOUS
  Administered 2019-03-12 – 2019-03-13 (×2): 3 [IU] via SUBCUTANEOUS

## 2019-03-11 MED ORDER — SODIUM CHLORIDE 0.9% FLUSH
3.0000 mL | Freq: Two times a day (BID) | INTRAVENOUS | Status: DC
  Administered 2019-03-11 – 2019-03-14 (×7): 3 mL via INTRAVENOUS

## 2019-03-11 MED ORDER — ONDANSETRON 4 MG PO TBDP: 4.0000 mg | ORAL_TABLET | Freq: Once | ORAL | Status: DC | PRN

## 2019-03-11 NOTE — ED Triage Notes (Addendum)
Patient reports onset of "dark, black watery stools" about 0100 this morning - also endorsing nausea and abdominal pain. States she just finished an antibiotic for UTI. Adds that on the way here, she began having L sided chest pain (hx of same, CHF), and took 1 NTG without relief. Resp e/u, skin w/d. Denies shortness of breath, vomiting. Not on blood thinner.

## 2019-03-11 NOTE — Progress Notes (Signed)
Pt was able to transfer from a stretcher to the bed with 2 person assistance. 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 Pt said she can't walk without special shoes that is designed for her right foot.  Pt was not able to tell me how much insulin dosages she's been giving herself at home using insulin pump. Already talk to Dr. Tamala Julian regarding not being able to chart pt's insulin pump dministration. Pt had 2 wounds to her left shin.

## 2019-03-11 NOTE — ED Notes (Signed)
Pt has returned from CT.  

## 2019-03-11 NOTE — Consult Note (Signed)
Reason for Consult:GI bleed, anemia Referring Physician: [facility]  [name] HPI: This is a 68 year old female with a PMH of pandiverticulosis, CAD, poorly controlled diabetes, morbid obesity, and renal insufficiency who presents to the ER with complaints of hematochezia.  Her symptoms started acutely at 1 AM.  She woke up from sleep and had "black" diarrhea.  As the bowel movements progressed, she noticed a burgundy color on the toilet tissue.  Abdominal cramping accompanied her bowel movements, but there was no pain after she passed the stool.  There was no report of hematemesis, nausea, vomiting, or any recent NSAID use.  At home she had a total of 5 bloody bowel movements and she denies any further bowel movements in the ER, although she has a sensation to have a bowel movement.  The patient denies any issues with chest pain or SOB.  In 2011 she had a colonoscopy with Dr. Collene Mares with findings of an inflammatory polyp in the rectum and pandiverticulosis.  She was supposed to have a follow up colonoscopy with Dr. Collene Mares a few years ago with her family history of colon cancer.  The patient has struggled with proper blood sugar control and her current A1C is at 12% and her intake blood sugar was 413.  No ketones were noted in her UA.  Past Medical History:  Diagnosis Date  . Acute MI ([place]) 1999; 2007  . Anemia    hx  . Anginal pain ([place])   . Anxiety   . ARF (acute renal failure) ([place]) 06/2017   [facility]  . Arthritis    "generalized" (03/15/2014)  . CAD (coronary artery disease)    MI in 2000 - MI  2007 - treated bare metal stent (no nuclear since then as 9/11)  . Carotid artery disease ([place])   . CHF (congestive heart failure) ([place])   . Chronic diastolic heart failure (HCC)    a) ECHO (08/2013) EF 55-60% and RV function nl b) RHC (08/2013) RA 4, RV 30/5/7, PA 25/10 (16), PCWP 7, Fick CO/CI 6.3/2.7, PVR 1.5 WU, PA 61 and 66%  . Daily headache    "~ every other day; since I  fell in June" (03/15/2014)  . Depression   . Dyslipidemia   . Dyspnea   . Exertional shortness of breath   . HTN (hypertension)   . Hypothyroidism   . Neuropathy   . Obesity   . Osteoarthritis   . Peripheral neuropathy   . PONV (postoperative nausea and vomiting)   . RBBB (right bundle branch block)    Old  . Stroke [facility])    mini strokes  . Syncope    likely due to low blood sugar  . Tachycardia    Sinus tachycardia  . Type II diabetes mellitus (HCC)    Type II  . Urinary incontinence   . Venous insufficiency     Past Surgical History:  Procedure Laterality Date  . ABDOMINAL HYSTERECTOMY  1980's  . AMPUTATION Right 02/24/2018   Procedure: RIGHT FOOT GREAT TOE AND 2ND TOE AMPUTATION;  Surgeon: Newt Minion, MD;  Location: [facility];  Service: Orthopedics;  Laterality: Right;  . AMPUTATION Right 04/30/2018   Procedure: RIGHT TRANSMETATARSAL AMPUTATION;  Surgeon: Newt Minion, MD;  Location: [place];  Service: Orthopedics;  Laterality: Right;  . CATARACT EXTRACTION, BILATERAL Bilateral ?2013  . COLONOSCOPY W/ POLYPECTOMY    . CORONARY ANGIOPLASTY WITH STENT PLACEMENT  1999; 2007   "1 + 1"  .  EYE SURGERY Bilateral    [facility]  . KNEE ARTHROSCOPY Left 10/25/2006  . RIGHT HEART CATH N/A 07/24/2017   Procedure: RIGHT HEART CATH;  Surgeon: Jolaine Artist, MD;  Location: [facility] CV LAB;  Service: Cardiovascular;  Laterality: N/A;  . RIGHT HEART CATHETERIZATION N/A 09/22/2013   Procedure: RIGHT HEART CATH;  Surgeon: Jolaine Artist, MD;  Location: [facility] CATH [facility];  Service: Cardiovascular;  Laterality: N/A;  . SHOULDER ARTHROSCOPY WITH OPEN ROTATOR CUFF REPAIR Right 03/14/2014   Procedure: RIGHT SHOULDER ARTHROSCOPY WITH BICEPS RELEASE, OPEN SUBSCAPULA REPAIR, OPEN SUPRASPINATUS REPAIR.;  Surgeon: Meredith Pel, MD;  Location: [place];  Service: Orthopedics;  Laterality: Right;  . TOE AMPUTATION Right 02/24/2018   GREAT TOE AND 2ND TOE AMPUTATION  . TUBAL LIGATION  1970's     Family History  Problem Relation Age of Onset  . Heart attack Mother 69    Social History:  reports that she quit smoking about 21 years ago. Her smoking use included cigarettes. She has a 96.00 pack-year smoking history. She has never used smokeless tobacco. She reports previous alcohol use. She reports that she does not use drugs.  Allergies:  Allergies  Allergen Reactions  . Codeine Nausea And Vomiting    Medications:  Scheduled: . [START ON 03/12/2019] carvedilol  12.5 mg Oral BID WC  . [START ON 03/12/2019] ferrous sulfate  325 mg Oral BID WC  . insulin aspart  0-15 Units Subcutaneous TID WC  . insulin aspart  0-5 Units Subcutaneous QHS  . insulin pump   Subcutaneous TID WC, HS, 0200  . [START ON 03/12/2019] levothyroxine  50 mcg Oral QAC breakfast  . [START ON 03/12/2019] magnesium oxide  400 mg Oral q morning - 10a  . pregabalin  150 mg Oral BID  . [START ON 03/12/2019] rosuvastatin  20 mg Oral q morning - 10a  . sodium chloride flush  3 mL Intravenous Q12H   Continuous: . sodium chloride 75 mL/hr at 03/11/19 1750    Results for orders placed or performed during the hospital encounter of 03/11/19 (from the past 24 hour(s))  Type and screen [place]     Status: None (Preliminary result)   Collection Time: 03/11/19  6:10 AM  Result Value Ref Range   ABO/RH(D) A POS    Antibody Screen POS    Sample Expiration 03/14/2019,2359    Antibody Identification ANTI E    Unit Number [id number]    Blood Component Type RED CELLS,LR    Unit division 00    Status of Unit ALLOCATED    Transfusion Status OK TO TRANSFUSE    Crossmatch Result COMPATIBLE    Donor AG Type NEGATIVE FOR E ANTIGEN    Unit Number [id number]    Blood Component Type RED CELLS,LR    Unit division 00    Status of Unit ALLOCATED    Transfusion Status OK TO TRANSFUSE    Crossmatch Result COMPATIBLE    Donor AG Type NEGATIVE FOR E ANTIGEN   Comprehensive metabolic panel      Status: Abnormal   Collection Time: 03/11/19  6:24 AM  Result Value Ref Range   Sodium 134 (L) 135 - 145 mmol/L   Potassium 5.2 (H) 3.5 - 5.1 mmol/L   Chloride 97 (L) 98 - 111 mmol/L   CO2 24 22 - 32 mmol/L   Glucose, Bld 413 (H) 70 - 99 mg/dL   BUN 66 (H) 8 - 23 mg/dL   Creatinine,  Ser 2.90 (H) 0.44 - 1.00 mg/dL   Calcium 8.6 (L) 8.9 - 10.3 mg/dL   Total Protein 5.7 (L) 6.5 - 8.1 g/dL   Albumin 3.0 (L) 3.5 - 5.0 g/dL   AST 17 15 - 41 U/L   ALT 17 0 - 44 U/L   Alkaline Phosphatase 125 38 - 126 U/L   Total Bilirubin 0.3 0.3 - 1.2 mg/dL   GFR calc non Af Amer 16 (L) >60 mL/min   GFR calc Af Amer 18 (L) >60 mL/min   Anion gap 13 5 - 15  CBC     Status: Abnormal   Collection Time: 03/11/19  6:24 AM  Result Value Ref Range   WBC 12.1 (H) 4.0 - 10.5 K/uL   RBC 4.16 3.87 - 5.11 MIL/uL   Hemoglobin 10.8 (L) 12.0 - 15.0 g/dL   HCT 35.3 (L) 36.0 - 46.0 %   MCV 84.9 80.0 - 100.0 fL   MCH 26.0 26.0 - 34.0 pg   MCHC 30.6 30.0 - 36.0 g/dL   RDW 15.7 (H) 11.5 - 15.5 %   Platelets 231 150 - 400 K/uL   nRBC 0.0 0.0 - 0.2 %  Troponin I (High Sensitivity)     Status: None   Collection Time: 03/11/19  6:31 AM  Result Value Ref Range   Troponin I (High Sensitivity) 13 <18 ng/L  POC occult blood, ED     Status: Abnormal   Collection Time: 03/11/19  8:43 AM  Result Value Ref Range   Fecal Occult Bld POSITIVE (A) NEGATIVE  Troponin I (High Sensitivity)     Status: None   Collection Time: 03/11/19 10:35 AM  Result Value Ref Range   Troponin I (High Sensitivity) 11 <18 ng/L  Urinalysis, Routine w reflex microscopic     Status: Abnormal   Collection Time: 03/11/19  1:39 PM  Result Value Ref Range   Color, Urine STRAW (A) YELLOW   APPearance CLEAR CLEAR   Specific Gravity, Urine 1.008 1.005 - 1.030   pH 5.0 5.0 - 8.0   Glucose, UA >=500 (A) NEGATIVE mg/dL   Hgb urine dipstick LARGE (A) NEGATIVE   Bilirubin Urine NEGATIVE NEGATIVE   Ketones, ur NEGATIVE NEGATIVE mg/dL   Protein, ur 30 (A)  NEGATIVE mg/dL   Nitrite NEGATIVE NEGATIVE   Leukocytes,Ua NEGATIVE NEGATIVE   RBC / HPF 0-5 0 - 5 RBC/hpf   WBC, UA 0-5 0 - 5 WBC/hpf   Bacteria, UA RARE (A) NONE SEEN   Squamous Epithelial / LPF 0-5 0 - 5   Mucus PRESENT   SARS CORONAVIRUS 2 (TAT 6-24 HRS)     Status: None   Collection Time: 03/11/19  1:39 PM  Result Value Ref Range   SARS Coronavirus 2 NEGATIVE NEGATIVE  CBG monitoring, ED     Status: Abnormal   Collection Time: 03/11/19  4:21 PM  Result Value Ref Range   Glucose-Capillary 334 (H) 70 - 99 mg/dL  Glucose, capillary     Status: Abnormal   Collection Time: 03/11/19  5:28 PM  Result Value Ref Range   Glucose-Capillary 315 (H) 70 - 99 mg/dL  Hemoglobin A1c     Status: Abnormal   Collection Time: 03/11/19  5:40 PM  Result Value Ref Range   Hgb A1c MFr Bld 12.0 (H) 4.8 - 5.6 %   Mean Plasma Glucose 297.7 mg/dL  Hemoglobin and hematocrit, blood     Status: Abnormal   Collection Time: 03/11/19  5:40 PM  Result Value Ref Range   Hemoglobin 10.1 (L) 12.0 - 15.0 g/dL   HCT 31.3 (L) 36.0 - 41.7 %  Basic metabolic panel     Status: Abnormal   Collection Time: 03/11/19  5:40 PM  Result Value Ref Range   Sodium 135 135 - 145 mmol/L   Potassium 4.1 3.5 - 5.1 mmol/L   Chloride 99 98 - 111 mmol/L   CO2 25 22 - 32 mmol/L   Glucose, Bld 306 (H) 70 - 99 mg/dL   BUN 68 (H) 8 - 23 mg/dL   Creatinine, Ser 2.14 (H) 0.44 - 1.00 mg/dL   Calcium 8.5 (L) 8.9 - 10.3 mg/dL   GFR calc non Af Amer 23 (L) >60 mL/min   GFR calc Af Amer 27 (L) >60 mL/min   Anion gap 11 5 - 15   *Note: Due to a large number of results and/or encounters for the requested time period, some results have not been displayed. A complete set of results can be found in Results Review.     Ct Abdomen Pelvis Wo Contrast  Result Date: 03/11/2019 CLINICAL DATA:  Abdominal pain, dark black watery stools with nausea. History of recent antibiotic administration. EXAM: CT ABDOMEN AND PELVIS WITHOUT CONTRAST  TECHNIQUE: Multidetector CT imaging of the abdomen and pelvis was performed following the standard protocol without IV contrast. COMPARISON:  No CT comparison is available. Most recent abdominal comparison is renal sonogram from October of 2019. FINDINGS: Lower chest: Signs of coronary artery disease and prior percutaneous coronary intervention. No signs of pericardial effusion. No basilar consolidation or pleural effusion. Heart is incompletely imaged. Hepatobiliary: Non-contrast appearance of the liver is unremarkable. Dense gallstones are present in the proximal gallbladder. No signs of pericholecystic fluid or stranding. Pancreas: Unremarkable. No pancreatic ductal dilatation or surrounding inflammatory changes. A Spleen: Normal in size without focal abnormality. Adrenals/Urinary Tract: Normal appearance of bilateral adrenal glands. Mild perinephric stranding bilaterally. No signs of hydronephrosis. No visible nephro or ureterolithiasis. Mild renal cortical scarring with relative symmetry. Stomach/Bowel: Pancolonic diverticulosis. No signs of acute diverticulitis. Normal appendix. No signs of bowel obstruction. Vascular/Lymphatic: Atherosclerotic changes of the abdominal aorta. No signs of aneurysmal dilation. No signs of upper abdominal or retroperitoneal lymphadenopathy. No signs of pelvic adenopathy. Reproductive: Post hysterectomy. Other: No signs of free air. No focal fluid collection. No ascites. Musculoskeletal: No acute bone finding or destructive bone process. Spinal degenerative changes. IMPRESSION: 1. No cause for the patient's symptoms identified. 2. Cholelithiasis. Pancolonic diverticulosis. 3. Atherosclerosis in the abdominal aorta. Aortic Atherosclerosis (ICD10-I70.0). Electronically Signed   By: Zetta Bills M.D.   On: 03/11/2019 12:48   Dg Chest 2 View  Result Date: 03/11/2019 CLINICAL DATA:  Left-sided chest pain. Abdominal pain, nausea, and diarrhea. EXAM: CHEST - 2 VIEW COMPARISON:   03/18/2018 FINDINGS: The heart size and mediastinal contours are within normal limits. Aortic atherosclerosis. Both lungs are clear. The visualized skeletal structures are unremarkable. IMPRESSION: No active cardiopulmonary disease. Electronically Signed   By: Marlaine Hind M.D.   On: 03/11/2019 07:11    ROS:  As stated above in the HPI otherwise negative.  Blood pressure (!) 141/65, pulse 76, temperature 97.7 F (36.5 C), temperature source Oral, resp. rate 20, SpO2 98 %.    PE: Gen: NAD, Alert and Oriented HEENT:  [place]/AT, EOMI Neck: Supple, no LAD Lungs: CTA Bilaterally CV: RRR without M/G/R ABM: Soft, NTND, +BS, morbidly obese Ext: No C/C/E  Assessment/Plan: 1) GI bleed. 2) Pandiverticulosis. 3) Poorly controlled  DM. 4) Anemia. 5) Renal insufficiency.   The patient requires endoscopic evaluation.  The suspicion is that she has a diverticular bleed, but she did clearly state having black diarrhea at the outset.  There was no pain with palpation of her abdomen and there is no history consistent with NSAID use or GERD.  She will requires an EGD/colonoscopy for further evaluation, however, the endoscopic evaluation cannot be performed until there is proper blood sugar control.  A blood glucose >300 puts a patient at greater risks for cardio-pulmonary complications with the endoscopic procedures.  The patient admits to poor control of her blood sugar as a result of her poor dietary habits.  Plan: 1) Control blood sugar. 2) Follow HGB and transfuse as necessary. 3) EGD/colonoscopy once she is medically stable.  Yostin Malacara D 03/11/2019, 7:26 PM

## 2019-03-11 NOTE — H&P (Signed)
History and Physical    Susan [name] XNA:[id number] DOB: 08-Jul-1950 DOA: 03/11/2019  Referring MD/NP/PA: Gareth Morgan, MD PCP: Alycia Rossetti, MD  Patient coming from: Home  Chief Complaint: Dark stools  I have personally briefly reviewed patient's old medical records in [place]   HPI: Susan [name] is a 68 y.o. female with medical history significant of HTN, HLD, diastolic CHF with EF 54-27% with grade 1dfx, DM type II, chronic kidney disease, CAD, remote history of tobacco use, and CVA.  She presents with complaints of dark black stools.  Around 1 AM this morning reports having acute onset of lower crampy abdominal pain.  Patient reports pain not able to make it to the bathroom before having loose dark however stool mixed with bright red blood.  She had just recently finished a course of cephalexin on the12th for treatment of a UTI.  Since completed antibiotics she complains of burning with urination and discharge.  2 days prior patient had had some hard dark stools and had been taking MiraLAX.  Patient normally on Plavix and aspirin, but denies any other blood thinners.  Other associated symptoms include complaints of left-sided chest pain prior to arrival for which patient took 1 nitroglycerin around 2 AM with relief of symptoms.  Over the last month or so she reports despite being on a insulin pump blood sugars have been in the 400-500s.  Patient reports that she is not had another bowel movement since being admitted to the hospital.   ED Course: Upon admission into the emergency department patient was noted to be afebrile with vital signs relatively stable.  Labs significant for WBC 12.1, hemoglobin 10.8, sodium 134, potassium 5.2, chloride 97, CO2 24, BUN 66, creatinine 2.9, glucose 413, troponin negative x2, and anion gap 13.  Chest x-ray showed no acute abnormalities.  Stools were noted to be guaiac positive.  Patient had been typed and screened for possible need for products.   She was also given Ativan 1 mg, Zofran, and 150 mg of Diflucan.  TRH called to admit.  Review of Systems  Constitutional: Negative for chills and fever.  HENT: Negative for ear discharge and nosebleeds.   Eyes: Negative for photophobia and pain.  Respiratory: Negative for cough and shortness of breath.   Cardiovascular: Positive for chest pain. Negative for leg swelling.  Gastrointestinal: Positive for abdominal pain, blood in stool and diarrhea. Negative for vomiting.  Genitourinary: Positive for dysuria. Negative for frequency.  Musculoskeletal: Negative for falls.  Skin: Negative for itching.  Neurological: Positive for headaches. Negative for focal weakness and loss of consciousness.  Psychiatric/Behavioral: Negative for memory loss and substance abuse.    Past Medical History:  Diagnosis Date  . Acute MI ([facility]) 1999; 2007  . Anemia    hx  . Anginal pain ([place])   . Anxiety   . ARF (acute renal failure) ([facility]) 06/2017   [facility]  . Arthritis    "generalized" (03/15/2014)  . CAD (coronary artery disease)    MI in 2000 - MI  2007 - treated bare metal stent (no nuclear since then as 9/11)  . Carotid artery disease ([place])   . CHF (congestive heart failure) ([facility])   . Chronic diastolic heart failure (HCC)    a) ECHO (08/2013) EF 55-60% and RV function nl b) RHC (08/2013) RA 4, RV 30/5/7, PA 25/10 (16), PCWP 7, Fick CO/CI 6.3/2.7, PVR 1.5 WU, PA 61 and 66%  . Daily headache    "~  every other day; since I fell in June" (03/15/2014)  . Depression   . Dyslipidemia   . Dyspnea   . Exertional shortness of breath   . HTN (hypertension)   . Hypothyroidism   . Neuropathy   . Obesity   . Osteoarthritis   . Peripheral neuropathy   . PONV (postoperative nausea and vomiting)   . RBBB (right bundle branch block)    Old  . Stroke [place])    mini strokes  . Syncope    likely due to low blood sugar  . Tachycardia    Sinus tachycardia  . Type II diabetes mellitus (HCC)    Type  II  . Urinary incontinence   . Venous insufficiency     Past Surgical History:  Procedure Laterality Date  . ABDOMINAL HYSTERECTOMY  1980's  . AMPUTATION Right 02/24/2018   Procedure: RIGHT FOOT GREAT TOE AND 2ND TOE AMPUTATION;  Surgeon: Newt Minion, MD;  Location: [facility];  Service: Orthopedics;  Laterality: Right;  . AMPUTATION Right 04/30/2018   Procedure: RIGHT TRANSMETATARSAL AMPUTATION;  Surgeon: Newt Minion, MD;  Location: [facility];  Service: Orthopedics;  Laterality: Right;  . CATARACT EXTRACTION, BILATERAL Bilateral ?2013  . COLONOSCOPY W/ POLYPECTOMY    . CORONARY ANGIOPLASTY WITH STENT PLACEMENT  1999; 2007   "1 + 1"  . EYE SURGERY Bilateral    [facility]  . KNEE ARTHROSCOPY Left 10/25/2006  . RIGHT HEART CATH N/A 07/24/2017   Procedure: RIGHT HEART CATH;  Surgeon: Jolaine Artist, MD;  Location: [facility];  Service: Cardiovascular;  Laterality: N/A;  . RIGHT HEART CATHETERIZATION N/A 09/22/2013   Procedure: RIGHT HEART CATH;  Surgeon: Jolaine Artist, MD;  Location: [facility];  Service: Cardiovascular;  Laterality: N/A;  . SHOULDER ARTHROSCOPY WITH OPEN ROTATOR CUFF REPAIR Right 03/14/2014   Procedure: RIGHT SHOULDER ARTHROSCOPY WITH BICEPS RELEASE, OPEN SUBSCAPULA REPAIR, OPEN SUPRASPINATUS REPAIR.;  Surgeon: Meredith Pel, MD;  Location: [facility];  Service: Orthopedics;  Laterality: Right;  . TOE AMPUTATION Right 02/24/2018   GREAT TOE AND 2ND TOE AMPUTATION  . TUBAL LIGATION  1970's     reports that she quit smoking about 21 years ago. Her smoking use included cigarettes. She has a 96.00 pack-year smoking history. She has never used smokeless tobacco. She reports previous alcohol use. She reports that she does not use drugs.  Allergies  Allergen Reactions  . Codeine Nausea And Vomiting    Family History  Problem Relation Age of Onset  . Heart attack Mother 36    Prior to Admission medications   Medication Sig Start Date End Date Taking?  Authorizing Provider  allopurinol (ZYLOPRIM) 100 MG tablet Take 1 tablet (100 mg total) by mouth 2 (two) times daily. 10/14/18   Newt Minion, MD  Ascorbic Acid (VITAMIN C) 250 MG CHEW Chew 1 tablet by mouth daily.    [provider]  aspirin EC 81 MG tablet Take 81 mg by mouth daily.    [provider]  carvedilol (COREG) 12.5 MG tablet TAKE 1 TABLET BY MOUTH TWICE A DAY WITH MEALS 02/10/19   [place], [name], MD  cholecalciferol (VITAMIN D) 1000 units tablet Take 1,000 Units by mouth daily.    [provider]  clopidogrel (PLAVIX) 75 MG tablet TAKE 1 TABLET BY MOUTH DAILY WITH BREAKFAST. 08/18/18   Larey Dresser, MD  colchicine 0.6 MG tablet Take 1 tablet (0.6 mg total) by mouth daily. As needed  for gout flare 10/14/18   Newt Minion, MD  cyclobenzaprine (FLEXERIL) 5 MG tablet Take 1 tablet (5 mg total) by mouth at bedtime. 02/08/19   [place]st, [place], PA-C  escitalopram (LEXAPRO) 20 MG tablet TAKE 1 TABLET BY MOUTH EVERY DAY 07/26/18   Alycia Rossetti, MD  ferrous sulfate 325 (65 FE) MG tablet Take 1 tablet (325 mg total) by mouth 2 (two) times daily with a meal. Reported on 08/21/2015 06/07/18   Alycia Rossetti, MD  Insulin Disposable Pump (OMNIPOD DASH 5 PACK PODS) MISC CHANGE EVERY 72 HOURS 11/18/18   [provider]  insulin NPH Human (HUMULIN N,NOVOLIN N) 100 UNIT/ML injection Inject into the skin daily as needed. Inject 10 units in the morning when blood sugar is 100-199. Inject 15 units in the morning if blood sugar is >200.    [provider]  insulin regular (NOVOLIN R,HUMULIN R) 100 units/mL injection Inject 7-17 Units into the skin See admin instructions. With OMNIPOD Sliding scale 80-150 7 units, 150-200 9 units, 201-250 12 units, 251-300 14 units, 301-400 17 units.    [provider]  levothyroxine (SYNTHROID, LEVOTHROID) 50 MCG tablet Take 1 tablet (50 mcg total) by mouth daily before breakfast. 11/07/16   Dena Billet B, PA-C   lidocaine (LIDODERM) 5 % Place 1 patch onto the skin daily. Remove & Discard patch within 12 hours or as directed by MD 02/08/19   Stacey Drain, [place], PA-C  magnesium oxide (MAG-OX) 400 MG tablet TAKE 1 TABLET BY MOUTH EVERY DAY 09/17/18   [place], [name], MD  methocarbamol (ROBAXIN) 500 MG tablet Take 1 tablet (500 mg total) by mouth at bedtime. 02/08/19   Wurst, [place], PA-C  metolazone (ZAROXOLYN) 2.5 MG tablet Take 1 tablet (2.5 mg total) by mouth as directed. Monday and Friday 12/13/18 03/13/19  Darrick Grinder D, NP  nitroGLYCERIN (NITROSTAT) 0.4 MG SL tablet PLACE 1 TABLET (0.4 MG TOTAL) UNDER THE TONGUE EVERY 5 (FIVE) MINUTES AS NEEDED FOR CHEST PAIN. 08/28/16   Dena Billet B, PA-C  nystatin cream (MYCOSTATIN) Apply 1 application topically 2 (two) times daily. 04/13/18   Alycia Rossetti, MD  [facility] VERIO test strip  11/02/17   [provider]  pregabalin (LYRICA) 150 MG capsule Take 1 capsule (150 mg total) by mouth 2 (two) times daily. 11/12/16   Alycia Rossetti, MD  PROAIR HFA 108 [phone] Base) MCG/ACT inhaler TAKE 2 PUFFS BY MOUTH EVERY 6 HOURS AS NEEDED FOR WHEEZE OR SHORTNESS OF BREATH 01/11/19   [place], [name], MD  rosuvastatin (CRESTOR) 20 MG tablet TAKE 1 TABLET BY MOUTH EVERY DAY 12/20/18   [place], [name], MD  tiZANidine (ZANAFLEX) 4 MG tablet Take 1 tablet (4 mg total) by mouth every 6 (six) hours as needed for muscle spasms. 02/16/19   Alycia Rossetti, MD  torsemide (DEMADEX) 20 MG tablet Take 60 mg by mouth 2 (two) times daily.    [provider]  traZODone (DESYREL) 100 MG tablet Take 1 tablet (100 mg total) by mouth at bedtime as needed for sleep. 10/19/18   Alycia Rossetti, MD  vitamin E (VITAMIN E) 1000 UNIT capsule Take 1,000 Units by mouth daily.    [provider]    Physical Exam:  Constitutional: Obese female who appears to be in no acute distress at this time Vitals:   03/11/19 1145 03/11/19 1200 03/11/19 1215 03/11/19 1330  BP: (!) 148/70  (!) 141/68 139/80 (!) 157/79  Pulse: 78 82  70  Resp: 16 14 (!) 24 14  Temp:      TempSrc:      SpO2: 95% 98%  97%   Eyes: PERRL, lids and conjunctivae normal ENMT: Mucous membranes are dry. Posterior pharynx clear of any exudate or lesions.  Neck: normal, supple, no masses, no thyromegaly Respiratory: clear to auscultation bilaterally, no wheezing, no crackles. Normal respiratory effort. No accessory muscle use.  Cardiovascular: Regular rate and rhythm, no murmurs / rubs / gallops. No extremity edema. 2+ pedal pulses. No carotid bruits.  Abdomen: Lower abdominal tenderness palpation, no masses palpated. No hepatosplenomegaly. Bowel sounds positive.  Musculoskeletal: no clubbing / cyanosis.  Transmetatarsal amputation of the right foot Skin: Wounds present of the left shin.  Insulin pump on the left shoulder. Neurologic: CN 2-12 grossly intact. Sensation intact, DTR normal. Strength 5/5 in all 4.  Psychiatric: Normal judgment and insight. Alert and oriented x 3. Normal mood.     Labs on Admission: I have personally reviewed following labs and imaging studies  CBC: Recent Labs  Lab 03/11/19 0624  WBC 12.1*  HGB 10.8*  HCT 35.3*  MCV 84.9  PLT 381   Basic Metabolic Panel: Recent Labs  Lab 03/11/19 0624  NA 134*  K 5.2*  CL 97*  CO2 24  GLUCOSE 413*  BUN 66*  CREATININE 2.90*  CALCIUM 8.6*   GFR: CrCl cannot be calculated (Unknown ideal weight.). Liver Function Tests: Recent Labs  Lab 03/11/19 0624  AST 17  ALT 17  ALKPHOS 125  BILITOT 0.3  PROT 5.7*  ALBUMIN 3.0*   No results for input(s): LIPASE, AMYLASE in the last 168 hours. No results for input(s): AMMONIA in the last 168 hours. Coagulation Profile: No results for input(s): INR, PROTIME in the last 168 hours. Cardiac Enzymes: No results for input(s): CKTOTAL, CKMB, CKMBINDEX, TROPONINI in the last 168 hours. BNP (last 3 results) No results for input(s): PROBNP in the last 8760 hours. HbA1C: No  results for input(s): HGBA1C in the last 72 hours. CBG: No results for input(s): GLUCAP in the last 168 hours. Lipid Profile: No results for input(s): CHOL, HDL, LDLCALC, TRIG, CHOLHDL, LDLDIRECT in the last 72 hours. Thyroid Function Tests: No results for input(s): TSH, T4TOTAL, FREET4, T3FREE, THYROIDAB in the last 72 hours. Anemia Panel: No results for input(s): VITAMINB12, FOLATE, FERRITIN, TIBC, IRON, RETICCTPCT in the last 72 hours. Urine analysis:    Component Value Date/Time   COLORURINE STRAW (A) 03/18/2018 1839   APPEARANCEUR CLEAR 03/18/2018 1839   LABSPEC 1.008 03/18/2018 1839   PHURINE 5.0 03/18/2018 1839   GLUCOSEU 150 (A) 03/18/2018 1839   HGBUR SMALL (A) 03/18/2018 [place] 03/18/2018 1839   KETONESUR NEGATIVE 03/18/2018 1839   PROTEINUR 100 (A) 03/18/2018 1839   UROBILINOGEN 0.2 04/01/2014 1659   NITRITE NEGATIVE 03/18/2018 1839   LEUKOCYTESUR SMALL (A) 03/18/2018 1839   Sepsis Labs: No results found for this or any previous visit (from the past 240 hour(s)).   Radiological Exams on Admission: Ct Abdomen Pelvis Wo Contrast  Result Date: 03/11/2019 CLINICAL DATA:  Abdominal pain, dark black watery stools with nausea. History of recent antibiotic administration. EXAM: CT ABDOMEN AND PELVIS WITHOUT CONTRAST TECHNIQUE: Multidetector CT imaging of the abdomen and pelvis was performed following the standard protocol without IV contrast. COMPARISON:  No CT comparison is available. Most recent abdominal comparison is renal sonogram from October of 2019. FINDINGS: Lower chest: Signs of coronary artery disease and prior percutaneous coronary intervention.  No signs of pericardial effusion. No basilar consolidation or pleural effusion. Heart is incompletely imaged. Hepatobiliary: Non-contrast appearance of the liver is unremarkable. Dense gallstones are present in the proximal gallbladder. No signs of pericholecystic fluid or stranding. Pancreas: Unremarkable.  No pancreatic ductal dilatation or surrounding inflammatory changes. A Spleen: Normal in size without focal abnormality. Adrenals/Urinary Tract: Normal appearance of bilateral adrenal glands. Mild perinephric stranding bilaterally. No signs of hydronephrosis. No visible nephro or ureterolithiasis. Mild renal cortical scarring with relative symmetry. Stomach/Bowel: Pancolonic diverticulosis. No signs of acute diverticulitis. Normal appendix. No signs of bowel obstruction. Vascular/Lymphatic: Atherosclerotic changes of the abdominal aorta. No signs of aneurysmal dilation. No signs of upper abdominal or retroperitoneal lymphadenopathy. No signs of pelvic adenopathy. Reproductive: Post hysterectomy. Other: No signs of free air. No focal fluid collection. No ascites. Musculoskeletal: No acute bone finding or destructive bone process. Spinal degenerative changes. IMPRESSION: 1. No cause for the patient's symptoms identified. 2. Cholelithiasis. Pancolonic diverticulosis. 3. Atherosclerosis in the abdominal aorta. Aortic Atherosclerosis (ICD10-I70.0). Electronically Signed   By: Zetta Bills M.D.   On: 03/11/2019 12:48   Dg Chest 2 View  Result Date: 03/11/2019 CLINICAL DATA:  Left-sided chest pain. Abdominal pain, nausea, and diarrhea. EXAM: CHEST - 2 VIEW COMPARISON:  03/18/2018 FINDINGS: The heart size and mediastinal contours are within normal limits. Aortic atherosclerosis. Both lungs are clear. The visualized skeletal structures are unremarkable. IMPRESSION: No active cardiopulmonary disease. Electronically Signed   By: Marlaine Hind M.D.   On: 03/11/2019 07:11    EKG: Independently reviewed.  Sinus rhythm rhythm at 80 bpm with PVC QTC 514.  Assessment/Plan GI bleed, acute blood loss anemia: Acute.  Patient presents with complaints of bloody diarrhea.  Hemoglobin 10.8 g/dL and stool guaiacs positive.  Unclear if symptoms secondary to upper or lower GI bleed at this time.  Patient on Plavix and aspirin.  -Admit to a medical telemetry  -Clear liquid diet as tolerated, n.p.o. after midnight for possible need of procedure -Hold aspirin and Plavix  -Unable to place on Protonix drip due to prolonged QTC -Follow-up C. Difficile, if diarrhea persists given recent antibiotic use -Normal saline IV fluids at 75 mL/h -Appreciate GI consultative services, will follow further recommendation  Chest pain: Patient reported having chest pain prior to arrival that resolved with nitroglycerin.  Troponins negative x2. -Continue to monitor  Hyperkalemia: Acute.  Potassium 5.2 on admission.  -Gentle IV fluids at 50 mL/h -Recheck BMP  Insulin-dependent diabetes mellitus with hyperglycemia: Acute.  Initial glucose elevated at 413 on admission without anion gap.  Patient reports blood sugars in the 400-500s daily.  Hemoglobin A1c noted to be 8.3 on 04/30/2018. -Hypoglycemic protocol -Check hemoglobin A1c  -Continue insulin pump per protocol  -CBGs before every meal and at bedtime with moderate SSI -Diabetic educator consulted to help with current insulin regimen which   Prolonged QT interval: Acute.  QTc 514 on admission. -Correct electrolyte abnormality -Recheck EKG in a.m.  -Hold QT prolonging medication  Acute kidney injury superimposed on chronic kidney disease stage III: Patient presents with creatinine elevated up to 2.9 with BUN 66.    Baseline creatinine previously noted to be around 1.6- 1.8.Suspect a prerenal cause symptoms. -Check urine sodium, creatinine, and urea -Gentle IV fluids as seen above -Recheck BMP in a.m. -Holding nephrotoxic agent  Diastolic CHF: Last EF noted to be 55 to 60% with grade 1 diastolic dysfunction.  Chest x-ray otherwise clear.  Patient appears to be compensated at this time. -Strict I&Os -  Daily weights  Hypothyroidism: Stable. -Continue levothyroxine  Essential hypertension: Blood pressure currently stable. -Continue Coreg  Yeast infection: Patient reports  having recent urinary tract infection treated with Keflex.  Since completing course on 10/12 patient reports dysuria secondary to yeast infection.  Patient was given Diflucan x1 dose. -Continue to monitor and repeat Diflucan if needed  Impaired ambulation secondary to right foot transmetatarsal amputation  Hyperlipidemia -Continue Crestor  DVT prophylaxis: SCD Code Status: Full Family Communication: No family present at bedside Disposition Plan: Possible discharge home in 1 to 2 days Consults called: GI Admission status: Observation  Norval Morton MD [facility] Pager [phone]   If 7PM-7AM, please contact night-coverage [email or web address] Password [email or web address]  03/11/2019, 2:05 PM

## 2019-03-11 NOTE — ED Provider Notes (Signed)
[facility] EMERGENCY DEPARTMENT Provider Note   CSN: [id number] Arrival date & time: 03/11/19  0609     History   Chief Complaint Chief Complaint  Patient presents with   GI Bleeding   Chest Pain    HPI Susan [name] is a 68 y.o. female with PMH significant for CAD, CVA, renal failure, CAD, HTN, HLD, and type II DM who presents to the ED accompanied by her husband with episode of dark black stools this morning as well as acute onset left-sided chest pain while en route to the hospital.  She took 1 nitroglycerin with some relief.  Patient states that she had sudden, bloated urgency last night at approximately 1 AM and was unable to make it to her commode before having a loose, dark, malodorous stool with red blood mixed in.  She states that she is overdue for colonoscopy.  She reports having recently finished antibiotics for a diagnosed urinary tract infection.  Her husband states that she has had some dry, hard black stools in the past 2 days for which she has been taking MiraLAX.  She also endorses lower abdominal discomfort, particularly on the left side, described as a 6 out of 10 with some associated nausea.  Her chest discomfort had resolved shortly after NTG.  Patient also states that she has been taking Lasix, but has not urinated since yesterday and her PureWick has been empty despite continued diuretic use.  She denies any dizziness, headache, worsening shortness of breath, current chest pain, or focal neurologic deficits.     HPI  Past Medical History:  Diagnosis Date   Acute MI ([place]) 1999; 2007   Anemia    hx   Anginal pain ([place])    Anxiety    ARF (acute renal failure) ([place]) 06/2017   [facility] As[facility]   Arthritis    "generalized" (03/15/2014)   CAD (coronary artery disease)    MI in 2000 - MI  2007 - treated bare metal stent (no nuclear since then as 9/11)   Carotid artery disease (HCC)    CHF (congestive heart failure) ([place])     Chronic diastolic heart failure ([name])    a) ECHO (08/2013) EF 55-60% and RV function nl b) RHC (08/2013) RA 4, RV 30/5/7, PA 25/10 (16), PCWP 7, Fick CO/CI 6.3/2.7, PVR 1.5 WU, PA 61 and 66%   Daily headache    "~ every other day; since I fell in June" (03/15/2014)   Depression    Dyslipidemia    Dyspnea    Exertional shortness of breath    HTN (hypertension)    Hypothyroidism    Neuropathy    Obesity    Osteoarthritis    Peripheral neuropathy    PONV (postoperative nausea and vomiting)    RBBB (right bundle branch block)    Old   Stroke ([place])    mini strokes   Syncope    likely due to low blood sugar   Tachycardia    Sinus tachycardia   Type II diabetes mellitus ([place])    Type II   Urinary incontinence    Venous insufficiency     Patient Active Problem List   Diagnosis Date Noted   Onychomycosis 06/21/2018   Pressure injury of skin 03/19/2018   Syncope 03/18/2018   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)    Toe osteomyelitis, right ([place])    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 ([place])    Acute on  chronic diastolic (congestive) heart failure ([place]) 02/13/2018   PVD (peripheral vascular disease) ([place]) 10/26/2017   Hypothyroid 07/27/2017   PAH (pulmonary artery hypertension) (HCC)    Impaired ambulation 07/19/2017   Hyperosmolar non-ketotic state in patient with type 2 diabetes mellitus ([place]) 07/15/2017   Leg cramps 02/27/2017   Peripheral edema 01/12/2017   Diabetic neuropathy ([place]) 11/12/2016   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 mellitus ([place]) 10/24/2015   Anemia 10/03/2015   Generalized anxiety disorder 10/03/2015   Insomnia 10/03/2015   Diabetes mellitus type II, uncontrolled ([place]) 06/07/2015   Chronic diastolic CHF (congestive heart failure) ([place]) 06/07/2015   Non compliance with medical treatment 04/17/2014   Rotator cuff tear 03/14/2014   Morbid obesity ([place]) 09/23/2013   Hypotension  12/25/2012   Urinary incontinence    MDD (major depressive disorder) 11/12/2010   RBBB (right bundle branch block)    CAD (coronary artery disease)    Hyperlipemia 01/22/2009   Essential hypertension 01/22/2009    Past Surgical History:  Procedure Laterality Date   ABDOMINAL HYSTERECTOMY  1980's   AMPUTATION Right 02/24/2018   Procedure: RIGHT FOOT GREAT TOE AND 2ND TOE AMPUTATION;  Surgeon: Newt Minion, MD;  Location: [facility];  Service: Orthopedics;  Laterality: Right;   AMPUTATION Right 04/30/2018   Procedure: RIGHT TRANSMETATARSAL AMPUTATION;  Surgeon: Newt Minion, MD;  Location: [facility];  Service: Orthopedics;  Laterality: Right;   CATARACT EXTRACTION, BILATERAL Bilateral ?2013   COLONOSCOPY W/ POLYPECTOMY     CORONARY ANGIOPLASTY WITH STENT PLACEMENT  1999; 2007   "1 + 1"   EYE SURGERY Bilateral    lazer   KNEE ARTHROSCOPY Left 10/25/2006   RIGHT HEART CATH N/A 07/24/2017   Procedure: RIGHT HEART CATH;  Surgeon: Jolaine Artist, MD;  Location: [facility] CV LAB;  Service: Cardiovascular;  Laterality: N/A;   RIGHT HEART CATHETERIZATION N/A 09/22/2013   Procedure: RIGHT HEART CATH;  Surgeon: Jolaine Artist, MD;  Location[facility];  Service: Cardiovascular;  Laterality: N/A;   SHOULDER ARTHROSCOPY WITH OPEN ROTATOR CUFF REPAIR Right 03/14/2014   Procedure: RIGHT SHOULDER ARTHROSCOPY WITH BICEPS RELEASE, OPEN SUBSCAPULA REPAIR, OPEN SUPRASPINATUS REPAIR.;  Surgeon: Meredith Pel, MD;  Location: [facility];  Service: Orthopedics;  Laterality: Right;   TOE AMPUTATION Right 02/24/2018   GREAT TOE AND 2ND TOE AMPUTATION   TUBAL LIGATION  1970's     OB History   No obstetric history on file.      Home Medications    Prior to Admission medications   Medication Sig Start Date End Date Taking? Authorizing Provider  allopurinol (ZYLOPRIM) 100 MG tablet Take 1 tablet (100 mg total) by mouth 2 (two) times daily. 10/14/18   Newt Minion, MD  Ascorbic  Acid (VITAMIN C) 250 MG CHEW Chew 1 tablet by mouth daily.    [provider]  aspirin EC 81 MG tablet Take 81 mg by mouth daily.    [provider]  carvedilol (COREG) 12.5 MG tablet TAKE 1 TABLET BY MOUTH TWICE A DAY WITH MEALS 02/10/19   [place], [name], MD  cholecalciferol (VITAMIN D) 1000 units tablet Take 1,000 Units by mouth daily.    [provider]  clopidogrel (PLAVIX) 75 MG tablet TAKE 1 TABLET BY MOUTH DAILY WITH BREAKFAST. 08/18/18   Larey Dresser, MD  colchicine 0.6 MG tablet Take 1 tablet (0.6 mg total) by mouth daily. As needed for gout flare 10/14/18   Meridee Score  V, MD  cyclobenzaprine (FLEXERIL) 5 MG tablet Take 1 tablet (5 mg total) by mouth at bedtime. 02/08/19   [place], [place], PA-C  escitalopram (LEXAPRO) 20 MG tablet TAKE 1 TABLET BY MOUTH EVERY DAY 07/26/18   Alycia Rossetti, MD  ferrous sulfate 325 (65 FE) MG tablet Take 1 tablet (325 mg total) by mouth 2 (two) times daily with a meal. Reported on 08/21/2015 06/07/18   Alycia Rossetti, MD  Insulin Disposable Pump (OMNIPOD DASH 5 PACK PODS) MISC CHANGE EVERY 72 HOURS 11/18/18   [provider]  insulin NPH Human (HUMULIN N,NOVOLIN N) 100 UNIT/ML injection Inject into the skin daily as needed. Inject 10 units in the morning when blood sugar is 100-199. Inject 15 units in the morning if blood sugar is >200.    [provider]  insulin regular (NOVOLIN R,HUMULIN R) 100 units/mL injection Inject 7-17 Units into the skin See admin instructions. With OMNIPOD Sliding scale 80-150 7 units, 150-200 9 units, 201-250 12 units, 251-300 14 units, 301-400 17 units.    [provider]  levothyroxine (SYNTHROID, LEVOTHROID) 50 MCG tablet Take 1 tablet (50 mcg total) by mouth daily before breakfast. 11/07/16   Dena Billet B, PA-C  lidocaine (LIDODERM) 5 % Place 1 patch onto the skin daily. Remove & Discard patch within 12 hours or as directed by MD 02/08/19   Stacey Drain, [place], PA-C  magnesium  oxide (MAG-OX) 400 MG tablet TAKE 1 TABLET BY MOUTH EVERY DAY 09/17/18   [place], [name], MD  methocarbamol (ROBAXIN) 500 MG tablet Take 1 tablet (500 mg total) by mouth at bedtime. 02/08/19   [place], [place], PA-C  metolazone (ZAROXOLYN) 2.5 MG tablet Take 1 tablet (2.5 mg total) by mouth as directed. Monday and Friday 12/13/18 03/13/19  Darrick Grinder D, NP  nitroGLYCERIN (NITROSTAT) 0.4 MG SL tablet PLACE 1 TABLET (0.4 MG TOTAL) UNDER THE TONGUE EVERY 5 (FIVE) MINUTES AS NEEDED FOR CHEST PAIN. 08/28/16   Dena Billet B, PA-C  nystatin cream (MYCOSTATIN) Apply 1 application topically 2 (two) times daily. 04/13/18   Alycia Rossetti, MD  [facility] VERIO test strip  11/02/17   [provider]  pregabalin (LYRICA) 150 MG capsule Take 1 capsule (150 mg total) by mouth 2 (two) times daily. 11/12/16   Alycia Rossetti, MD  PROAIR HFA 108 [phone] Base) MCG/ACT inhaler TAKE 2 PUFFS BY MOUTH EVERY 6 HOURS AS NEEDED FOR WHEEZE OR SHORTNESS OF BREATH 01/11/19   [place], [name], MD  rosuvastatin (CRESTOR) 20 MG tablet TAKE 1 TABLET BY MOUTH EVERY DAY 12/20/18   [place], [name], MD  tiZANidine (ZANAFLEX) 4 MG tablet Take 1 tablet (4 mg total) by mouth every 6 (six) hours as needed for muscle spasms. 02/16/19   Alycia Rossetti, MD  torsemide (DEMADEX) 20 MG tablet Take 60 mg by mouth 2 (two) times daily.    [provider]  traZODone (DESYREL) 100 MG tablet Take 1 tablet (100 mg total) by mouth at bedtime as needed for sleep. 10/19/18   Alycia Rossetti, MD  vitamin E (VITAMIN E) 1000 UNIT capsule Take 1,000 Units by mouth daily.    [provider]    Family History Family History  Problem Relation Age of Onset   Heart attack Mother 17    Social History Social History   Tobacco Use   Smoking status: Former Smoker    Packs/day: 3.00    Years: 32.00    Pack years: 96.00  Types: Cigarettes    Quit date: 10/24/1997    Years since quitting: 21.3   Smokeless tobacco: Never Used    Substance Use Topics   Alcohol use: Not Currently    Comment: "might have 2-3 daiquiris in the summer"   Drug use: No     Allergies   Codeine   Review of Systems Review of Systems  All other systems reviewed and are negative.    Physical Exam Updated Vital Signs BP (!) 157/79    Pulse 70    Temp 97.8 F (36.6 C) (Oral)    Resp 14    SpO2 97%   Physical Exam Vitals signs and nursing note reviewed. Exam conducted with a chaperone present.  Constitutional:      Appearance: Normal appearance.  HENT:     Head: Normocephalic and atraumatic.  Eyes:     General: No scleral icterus.    Conjunctiva/sclera: Conjunctivae normal.  Cardiovascular:     Rate and Rhythm: Normal rate and regular rhythm.     Pulses: Normal pulses.     Heart sounds: Normal heart sounds.  Pulmonary:     Effort: Pulmonary effort is normal. No respiratory distress.     Breath sounds: Normal breath sounds. No wheezing.  Abdominal:     Comments: Distended, but soft.  LLQ tenderness to palpation and mild suprapubic TTP.  No guarding.  No obvious masses appreciated.  Genitourinary:    Comments: Bright red blood per rectum.  No obvious fissure or hemorrhoids on exam.  Skin:    General: Skin is dry.  Neurological:     Mental Status: She is alert.     GCS: GCS eye subscore is 4. GCS verbal subscore is 5. GCS motor subscore is 6.  Psychiatric:        Mood and Affect: Mood normal.        Behavior: Behavior normal.        Thought Content: Thought content normal.      ED Treatments / Results  Labs (all labs ordered are listed, but only abnormal results are displayed) Labs Reviewed  COMPREHENSIVE METABOLIC PANEL - Abnormal; Notable for the following components:      Result Value   Sodium 134 (*)    Potassium 5.2 (*)    Chloride 97 (*)    Glucose, Bld 413 (*)    BUN 66 (*)    Creatinine, Ser 2.90 (*)    Calcium 8.6 (*)    Total Protein 5.7 (*)    Albumin 3.0 (*)    GFR calc non Af Amer 16 (*)     GFR calc Af Amer 18 (*)    All other components within normal limits  CBC - Abnormal; Notable for the following components:   WBC 12.1 (*)    Hemoglobin 10.8 (*)    HCT 35.3 (*)    RDW 15.7 (*)    All other components within normal limits  URINALYSIS, ROUTINE W REFLEX MICROSCOPIC - Abnormal; Notable for the following components:   Color, Urine STRAW (*)    Glucose, UA >=500 (*)    Hgb urine dipstick LARGE (*)    Protein, ur 30 (*)    Bacteria, UA RARE (*)    All other components within normal limits  POC OCCULT BLOOD, ED - Abnormal; Notable for the following components:   Fecal Occult Bld POSITIVE (*)    All other components within normal limits  URINE CULTURE  C DIFFICILE QUICK SCREEN W PCR  REFLEX  SARS CORONAVIRUS 2 (TAT 6-24 HRS)  TYPE AND SCREEN  TROPONIN I (HIGH SENSITIVITY)  TROPONIN I (HIGH SENSITIVITY)    EKG EKG Interpretation  Date/Time:  Friday March 11 2019 06:38:27 EDT Ventricular Rate:  80 PR Interval:  148 QRS Duration: 108 QT Interval:  446 QTC Calculation: 514 R Axis:   -66 Text Interpretation:  Sinus rhythm with occasional Premature ventricular complexes Left axis deviation Incomplete right bundle branch block Inferior infarct , age undetermined Anterior infarct , age undetermined Prolonged QT Abnormal ECG No significant change since last tracing Confirmed by Gareth Morgan [phone]) on 03/11/2019 9:06:51 AM   Radiology Ct Abdomen Pelvis Wo Contrast  Result Date: 03/11/2019 CLINICAL DATA:  Abdominal pain, dark black watery stools with nausea. History of recent antibiotic administration. EXAM: CT ABDOMEN AND PELVIS WITHOUT CONTRAST TECHNIQUE: Multidetector CT imaging of the abdomen and pelvis was performed following the standard protocol without IV contrast. COMPARISON:  No CT comparison is available. Most recent abdominal comparison is renal sonogram from October of 2019. FINDINGS: Lower chest: Signs of coronary artery disease and prior percutaneous  coronary intervention. No signs of pericardial effusion. No basilar consolidation or pleural effusion. Heart is incompletely imaged. Hepatobiliary: Non-contrast appearance of the liver is unremarkable. Dense gallstones are present in the proximal gallbladder. No signs of pericholecystic fluid or stranding. Pancreas: Unremarkable. No pancreatic ductal dilatation or surrounding inflammatory changes. A Spleen: Normal in size without focal abnormality. Adrenals/Urinary Tract: Normal appearance of bilateral adrenal glands. Mild perinephric stranding bilaterally. No signs of hydronephrosis. No visible nephro or ureterolithiasis. Mild renal cortical scarring with relative symmetry. Stomach/Bowel: Pancolonic diverticulosis. No signs of acute diverticulitis. Normal appendix. No signs of bowel obstruction. Vascular/Lymphatic: Atherosclerotic changes of the abdominal aorta. No signs of aneurysmal dilation. No signs of upper abdominal or retroperitoneal lymphadenopathy. No signs of pelvic adenopathy. Reproductive: Post hysterectomy. Other: No signs of free air. No focal fluid collection. No ascites. Musculoskeletal: No acute bone finding or destructive bone process. Spinal degenerative changes. IMPRESSION: 1. No cause for the patient's symptoms identified. 2. Cholelithiasis. Pancolonic diverticulosis. 3. Atherosclerosis in the abdominal aorta. Aortic Atherosclerosis (ICD10-I70.0). Electronically Signed   By: Zetta Bills M.D.   On: 03/11/2019 12:48   Dg Chest 2 View  Result Date: 03/11/2019 CLINICAL DATA:  Left-sided chest pain. Abdominal pain, nausea, and diarrhea. EXAM: CHEST - 2 VIEW COMPARISON:  03/18/2018 FINDINGS: The heart size and mediastinal contours are within normal limits. Aortic atherosclerosis. Both lungs are clear. The visualized skeletal structures are unremarkable. IMPRESSION: No active cardiopulmonary disease. Electronically Signed   By: Marlaine Hind M.D.   On: 03/11/2019 07:11     Procedures Procedures (including critical care time)  Medications Ordered in ED Medications  ondansetron (ZOFRAN-ODT) disintegrating tablet 4 mg (has no administration in time range)  LORazepam (ATIVAN) injection 1 mg (1 mg Intravenous Given 03/11/19 1036)  fluconazole (DIFLUCAN) tablet 150 mg (150 mg Oral Given 03/11/19 1342)     Initial Impression / Assessment and Plan / ED Course  I have reviewed the triage vital signs and the nursing notes.  Pertinent labs & imaging results that were available during my care of the patient were reviewed by me and considered in my medical decision making (see chart for details).        Labs demonstrate renal function is more impaired than usual.  She has slight leukocytosis to 12.1.  She is also mildly anemic to 10.8 Hgb, but that is unchanged when compared to her baseline.  She admits that she is long overdue for her colonoscopy and, given her LLQ tenderness to palpation and discomfort in addition to being fecal occult positive, obtained CT abdomen pelvis.  Provided 1 mg lorazepam for patient's significant nausea. Holding on insulin pump.   CT abdomen and pelvis was interpreted and demonstrates no obvious cause for patient's symptoms.  Diverticula noted, but without diverticulitis.  Asymptomatic cholelithiasis.  No obstructing masses or evidence of malignancy that would explain her LLQ pain and bloody stools.\  Dr. Billy Fischer consulted with GI and hospitalist group and she will be admitted for further evaluation and management.     Final Clinical Impressions(s) / ED Diagnoses   Final diagnoses:  Rectal bleeding    ED Discharge Orders    None       Corena Herter, PA-C 03/11/19 1419    Gareth Morgan, MD 03/12/19 [phone]

## 2019-03-11 NOTE — ED Notes (Signed)
Dinner Tray Ordered @ 1630.

## 2019-03-12 DIAGNOSIS — D124 Benign neoplasm of descending colon: Secondary | ICD-10-CM | POA: Diagnosis present

## 2019-03-12 DIAGNOSIS — I4581 Long QT syndrome: Secondary | ICD-10-CM | POA: Diagnosis present

## 2019-03-12 DIAGNOSIS — K921 Melena: Principal | ICD-10-CM

## 2019-03-12 DIAGNOSIS — D62 Acute posthemorrhagic anemia: Secondary | ICD-10-CM

## 2019-03-12 DIAGNOSIS — K922 Gastrointestinal hemorrhage, unspecified: Secondary | ICD-10-CM

## 2019-03-12 DIAGNOSIS — I5032 Chronic diastolic (congestive) heart failure: Secondary | ICD-10-CM | POA: Diagnosis present

## 2019-03-12 DIAGNOSIS — D125 Benign neoplasm of sigmoid colon: Secondary | ICD-10-CM | POA: Diagnosis present

## 2019-03-12 DIAGNOSIS — K625 Hemorrhage of anus and rectum: Secondary | ICD-10-CM

## 2019-03-12 DIAGNOSIS — E875 Hyperkalemia: Secondary | ICD-10-CM | POA: Diagnosis present

## 2019-03-12 DIAGNOSIS — K644 Residual hemorrhoidal skin tags: Secondary | ICD-10-CM | POA: Diagnosis present

## 2019-03-12 DIAGNOSIS — E1122 Type 2 diabetes mellitus with diabetic chronic kidney disease: Secondary | ICD-10-CM | POA: Diagnosis present

## 2019-03-12 DIAGNOSIS — Z20828 Contact with and (suspected) exposure to other viral communicable diseases: Secondary | ICD-10-CM | POA: Diagnosis present

## 2019-03-12 DIAGNOSIS — K317 Polyp of stomach and duodenum: Secondary | ICD-10-CM | POA: Diagnosis present

## 2019-03-12 DIAGNOSIS — K5730 Diverticulosis of large intestine without perforation or abscess without bleeding: Secondary | ICD-10-CM | POA: Diagnosis present

## 2019-03-12 DIAGNOSIS — E1165 Type 2 diabetes mellitus with hyperglycemia: Secondary | ICD-10-CM | POA: Diagnosis present

## 2019-03-12 DIAGNOSIS — Z6841 Body Mass Index (BMI) 40.0 and over, adult: Secondary | ICD-10-CM | POA: Diagnosis not present

## 2019-03-12 DIAGNOSIS — E1142 Type 2 diabetes mellitus with diabetic polyneuropathy: Secondary | ICD-10-CM | POA: Diagnosis present

## 2019-03-12 DIAGNOSIS — I130 Hypertensive heart and chronic kidney disease with heart failure and stage 1 through stage 4 chronic kidney disease, or unspecified chronic kidney disease: Secondary | ICD-10-CM | POA: Diagnosis present

## 2019-03-12 DIAGNOSIS — I2510 Atherosclerotic heart disease of native coronary artery without angina pectoris: Secondary | ICD-10-CM | POA: Diagnosis present

## 2019-03-12 DIAGNOSIS — N1832 Chronic kidney disease, stage 3b: Secondary | ICD-10-CM | POA: Diagnosis present

## 2019-03-12 DIAGNOSIS — D122 Benign neoplasm of ascending colon: Secondary | ICD-10-CM | POA: Diagnosis present

## 2019-03-12 DIAGNOSIS — N179 Acute kidney failure, unspecified: Secondary | ICD-10-CM | POA: Diagnosis present

## 2019-03-12 DIAGNOSIS — N189 Chronic kidney disease, unspecified: Secondary | ICD-10-CM | POA: Diagnosis not present

## 2019-03-12 DIAGNOSIS — I252 Old myocardial infarction: Secondary | ICD-10-CM | POA: Diagnosis not present

## 2019-03-12 DIAGNOSIS — K2970 Gastritis, unspecified, without bleeding: Secondary | ICD-10-CM | POA: Diagnosis present

## 2019-03-12 DIAGNOSIS — D123 Benign neoplasm of transverse colon: Secondary | ICD-10-CM | POA: Diagnosis present

## 2019-03-12 DIAGNOSIS — K648 Other hemorrhoids: Secondary | ICD-10-CM | POA: Diagnosis present

## 2019-03-12 DIAGNOSIS — K635 Polyp of colon: Secondary | ICD-10-CM | POA: Diagnosis not present

## 2019-03-12 LAB — CBC WITH DIFFERENTIAL/PLATELET
Abs Immature Granulocytes: 0.01 10*3/uL (ref 0.00–0.07)
Basophils Absolute: 0 10*3/uL (ref 0.0–0.1)
Basophils Relative: 1 %
Eosinophils Absolute: 0.3 10*3/uL (ref 0.0–0.5)
Eosinophils Relative: 5 %
HCT: 30.3 % — ABNORMAL LOW (ref 36.0–46.0)
Hemoglobin: 9.7 g/dL — ABNORMAL LOW (ref 12.0–15.0)
Immature Granulocytes: 0 %
Lymphocytes Relative: 18 %
Lymphs Abs: 1 10*3/uL (ref 0.7–4.0)
MCH: 26.4 pg (ref 26.0–34.0)
MCHC: 32 g/dL (ref 30.0–36.0)
MCV: 82.6 fL (ref 80.0–100.0)
Monocytes Absolute: 0.5 10*3/uL (ref 0.1–1.0)
Monocytes Relative: 8 %
Neutro Abs: 3.9 10*3/uL (ref 1.7–7.7)
Neutrophils Relative %: 68 %
Platelets: 187 10*3/uL (ref 150–400)
RBC: 3.67 MIL/uL — ABNORMAL LOW (ref 3.87–5.11)
RDW: 15.8 % — ABNORMAL HIGH (ref 11.5–15.5)
WBC: 5.7 10*3/uL (ref 4.0–10.5)
nRBC: 0 % (ref 0.0–0.2)

## 2019-03-12 LAB — GLUCOSE, CAPILLARY
Glucose-Capillary: 95 mg/dL (ref 70–99)
Glucose-Capillary: 107 mg/dL — ABNORMAL HIGH (ref 70–99)
Glucose-Capillary: 303 mg/dL — ABNORMAL HIGH (ref 70–99)
Glucose-Capillary: 287 mg/dL — ABNORMAL HIGH (ref 70–99)

## 2019-03-12 LAB — BASIC METABOLIC PANEL
Anion gap: 10 (ref 5–15)
BUN: 64 mg/dL — ABNORMAL HIGH (ref 8–23)
CO2: 26 mmol/L (ref 22–32)
Calcium: 8.6 mg/dL — ABNORMAL LOW (ref 8.9–10.3)
Chloride: 104 mmol/L (ref 98–111)
Creatinine, Ser: 1.92 mg/dL — ABNORMAL HIGH (ref 0.44–1.00)
GFR calc Af Amer: 30 mL/min — ABNORMAL LOW (ref >60)
GFR calc non Af Amer: 26 mL/min — ABNORMAL LOW (ref >60)
Glucose, Bld: 85 mg/dL (ref 70–99)
Potassium: 3.7 mmol/L (ref 3.5–5.1)
Sodium: 140 mmol/L (ref 135–145)

## 2019-03-12 LAB — CREATININE, URINE, RANDOM: Creatinine, Urine: 35.78 mg/dL

## 2019-03-12 MED ORDER — INSULIN GLARGINE 100 UNIT/ML ~~LOC~~ SOLN
12.0000 [IU] | Freq: Every day | SUBCUTANEOUS | Status: DC
  Administered 2019-03-12 – 2019-03-14 (×3): 12 [IU] via SUBCUTANEOUS
  Filled 2019-03-12 (×3): qty 0.12

## 2019-03-12 MED ORDER — PEG-KCL-NACL-NASULF-NA ASC-C 100 G PO SOLR
0.5000 | Freq: Once | ORAL | Status: AC
  Administered 2019-03-12: 100 g via ORAL
  Filled 2019-03-12: qty 1

## 2019-03-12 MED ORDER — PEG-KCL-NACL-NASULF-NA ASC-C 100 G PO SOLR
0.5000 | Freq: Once | ORAL | Status: AC
  Administered 2019-03-13: 100 g via ORAL
  Filled 2019-03-12: qty 1

## 2019-03-12 MED ORDER — PEG-KCL-NACL-NASULF-NA ASC-C 100 G PO SOLR
0.5000 | Freq: Once | ORAL | Status: DC
  Filled 2019-03-12: qty 1

## 2019-03-12 MED ORDER — PANTOPRAZOLE SODIUM 40 MG IV SOLR
40.0000 mg | Freq: Two times a day (BID) | INTRAVENOUS | Status: DC
  Administered 2019-03-12 – 2019-03-14 (×5): 40 mg via INTRAVENOUS
  Filled 2019-03-12 (×6): qty 40

## 2019-03-12 MED ORDER — PEG-KCL-NACL-NASULF-NA ASC-C 100 G PO SOLR: 1.0000 | Freq: Once | ORAL | Status: DC

## 2019-03-12 MED ORDER — SODIUM CHLORIDE 0.9 % IV SOLN
INTRAVENOUS | Status: AC
  Administered 2019-03-12: 10:00:00 via INTRAVENOUS

## 2019-03-12 NOTE — Progress Notes (Signed)
GI  Covering for Dr. Benson Norway and Dr. Collene Mares   Progress Note    ASSESSMENT AND PLAN:   4. 68 yo female with CAD, poorly controlled DM, obesity, [name] of colon cancer, and diverticulosis who presented to ED yesterday with hematochezia ( black - burgundy). No further BMs / bleeding since admission -Suspicion is for lower GI bleeding though she had black stool at onset of bleeding. She needs both EGD and colonoscopy. This would have been done today but her blood glucose level was > 300 increasing risk of procedures.  -Blood glucose controlled today. Will plan for procedures to be done in am. The risks and benefits of colonoscopy and EGD with biopsies were discussed and the patient agrees to proceed.   2. Acute on chronic normocytic anemia. Baseline hgb ~ 10.6, down to 9.7 today. Should stablize since bleeding has ceased  3. Passage of gas through vagina. Patient has been doing this for a year but too embarrassed to tell anyone. Denies passage of stool through vagina. Small rectovaginal fistula?      SUBJECTIVE   No further bleeding. Feels okay.   OBJECTIVE:     Vital signs in last 24 hours: Temp:  [97.7 F (36.5 C)-98.1 F (36.7 C)] 98.1 F (36.7 C) (10/17 1143) Pulse Rate:  [66-76] 76 (10/17 1143) Resp:  [14-20] 16 (10/17 1143) BP: (127-161)/(38-79) 127/38 (10/17 1143) SpO2:  [94 %-99 %] 94 % (10/17 1143) Weight:  [118.3 kg] 118.3 kg (10/17 0510) Last BM Date: 03/10/19 General:   Alert,  female in NAD EENT:  Normal hearing, non icteric sclera, conjunctive pink.  Heart:  Regular rate and rhythm;  No lower extremity edema   Pulm: Normal respiratory effort, lungs CTA bilaterally without wheezes or crackles. Abdomen:  Soft, nondistended, nontender.  Normal bowel sounds.          Neurologic:  Alert and  oriented x4;  grossly normal neurologically. Psych:  Pleasant, cooperative.  Normal mood and affect.   Intake/Output from previous day: 10/16 0701 - 10/17 0700 In: 1408.3  [P.O.:480; I.V.:928.3] Out: 800 [Urine:800] Intake/Output this shift: Total I/O In: 594.6 [P.O.:480; I.V.:114.6] Out: 700 [Urine:700]  Lab Results: Recent Labs    03/11/19 0624 03/11/19 1740 03/12/19 0523  WBC 12.1*  --  5.7  HGB 10.8* 10.1* 9.7*  HCT 35.3* 31.3* 30.3*  PLT 231  --  187   BMET Recent Labs    03/11/19 0624 03/11/19 1740 03/12/19 0523  NA 134* 135 140  K 5.2* 4.1 3.7  CL 97* 99 104  CO2 24 25 26   GLUCOSE 413* 306* 85  BUN 66* 68* 64*  CREATININE 2.90* 2.14* 1.92*  CALCIUM 8.6* 8.5* 8.6*   LFT Recent Labs    03/11/19 0624  PROT 5.7*  ALBUMIN 3.0*  AST 17  ALT 17  ALKPHOS 125  BILITOT 0.3   PT/INR No results for input(s): LABPROT, INR in the last 72 hours. Hepatitis Panel No results for input(s): HEPBSAG, HCVAB, HEPAIGM, HEPBIGM in the last 72 hours.  Ct Abdomen Pelvis Wo Contrast  Result Date: 03/11/2019 CLINICAL DATA:  Abdominal pain, dark black watery stools with nausea. History of recent antibiotic administration. EXAM: CT ABDOMEN AND PELVIS WITHOUT CONTRAST TECHNIQUE: Multidetector CT imaging of the abdomen and pelvis was performed following the standard protocol without IV contrast. COMPARISON:  No CT comparison is available. Most recent abdominal comparison is renal sonogram from October of 2019. FINDINGS: Lower chest: Signs of coronary artery disease and prior percutaneous coronary intervention.  No signs of pericardial effusion. No basilar consolidation or pleural effusion. Heart is incompletely imaged. Hepatobiliary: Non-contrast appearance of the liver is unremarkable. Dense gallstones are present in the proximal gallbladder. No signs of pericholecystic fluid or stranding. Pancreas: Unremarkable. No pancreatic ductal dilatation or surrounding inflammatory changes. A Spleen: Normal in size without focal abnormality. Adrenals/Urinary Tract: Normal appearance of bilateral adrenal glands. Mild perinephric stranding bilaterally. No signs of  hydronephrosis. No visible nephro or ureterolithiasis. Mild renal cortical scarring with relative symmetry. Stomach/Bowel: Pancolonic diverticulosis. No signs of acute diverticulitis. Normal appendix. No signs of bowel obstruction. Vascular/Lymphatic: Atherosclerotic changes of the abdominal aorta. No signs of aneurysmal dilation. No signs of upper abdominal or retroperitoneal lymphadenopathy. No signs of pelvic adenopathy. Reproductive: Post hysterectomy. Other: No signs of free air. No focal fluid collection. No ascites. Musculoskeletal: No acute bone finding or destructive bone process. Spinal degenerative changes. IMPRESSION: 1. No cause for the patient's symptoms identified. 2. Cholelithiasis. Pancolonic diverticulosis. 3. Atherosclerosis in the abdominal aorta. Aortic Atherosclerosis (ICD10-I70.0). Electronically Signed   By: Zetta Bills M.D.   On: 03/11/2019 12:48   Dg Chest 2 View  Result Date: 03/11/2019 CLINICAL DATA:  Left-sided chest pain. Abdominal pain, nausea, and diarrhea. EXAM: CHEST - 2 VIEW COMPARISON:  03/18/2018 FINDINGS: The heart size and mediastinal contours are within normal limits. Aortic atherosclerosis. Both lungs are clear. The visualized skeletal structures are unremarkable. IMPRESSION: No active cardiopulmonary disease. Electronically Signed   By: Marlaine Hind M.D.   On: 03/11/2019 07:11     Principal Problem:   GI bleed Active Problems:   Essential hypertension   Acute kidney injury superimposed on chronic kidney disease (HCC)   Diabetes mellitus type II, uncontrolled (HCC)   Chest pain   Hypothyroid   Hyperkalemia   Prolonged QT interval   Acute blood loss anemia     LOS: 0 days   Susan [name] ,NP 03/12/2019, 1:29 PM

## 2019-03-12 NOTE — Progress Notes (Signed)
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 Already communicating with diabetes coordinator, instructions received to give Lantus 12 units and communicated with patient to deactivate insulin pump at this time.  Patient stated she was hungry, RN communicated with Dr. Broadus John, started patient on clear liquid diet.

## 2019-03-12 NOTE — Progress Notes (Addendum)
Inpatient Diabetes Program Recommendations  AACE/ADA: New Consensus Statement on Inpatient Glycemic Control (2015)  Target Ranges:  Prepandial:   less than 140 mg/dL      Peak postprandial:   less than 180 mg/dL (1-2 hours)      Critically ill patients:  140 - 180 mg/dL   Results for PRISEIS, CRATTY (MRN [id number]) as of 03/12/2019 09:20  Ref. Range 03/11/2019 16:21 03/11/2019 17:28 03/11/2019 21:08  Glucose-Capillary Latest Ref Range: 70 - 99 mg/dL 334 (H) 315 (H)  11 units NOVOLOG  201 (H)  2 units NOVOLOG    Results for ABIA, MONACO (MRN [id number]) as of 03/12/2019 09:20  Ref. Range 03/12/2019 06:30  Glucose-Capillary Latest Ref Range: 70 - 99 mg/dL 95   Results for CARLE, DARGAN (MRN [id number]) as of 03/12/2019 09:23  Ref. Range 04/30/2018 06:40 03/11/2019 17:40  Hemoglobin A1C Latest Ref Range: 4.8 - 5.6 % 8.3 (H) 12.0 (H)  (297 mg/dl)   Admit GIB/ CP  History: DM2, CVA, CKD, CHF   Home DM Meds: NPH Insulin 10 units AM if CBG 100-199 or 15 units AM if CBG >200              Regular Insulin with the Omni Pod Dash Insulin Pump (80-150 give 7 units, 151-200 give 9 units, 201-250 give 12 units, 251-300 give 14 units, 301-400 give 17 units)--Looks like she's using the Omni Pump for boluses only   Current Orders: Novolog Moderate Correction Scale/ SSI (0-15 units) TID AC + HS     Insulin Pump    ENDO: Dr. [name]  Per GI notes, needs EGD/Colonoscopy once glucose levels are stable.  NPO this AM.    MD- Patient desires to Stop her Insulin Pump (since it will expire today) and she would like for [name] to manage her CBGs with SQ Insulin in the hospital.  Is agreeable to resuming her Insulin Pump when she goes home.  She will likely need some basal insulin added to her regimen off the pump since she takes NPH insulin at home.  Recommend the following:  1. Start Lantus 12 units Daily (0.1 units/kg)--Please make sure to start this AM  2. Continue Novolog SSI  3. Have patient  stop her Insulin Pump      Spoke w/ pt by phone this AM and she told me her Insulin Pump pod will expire today and that she would like for [name] to manage her CBGs and Insulin off the pump for now.  Pt told me she is not completely 100% sure how her pump works but states she tells the pump what her CBG is, presses the "bolus" button, and the pump delivers an insulin bolus based on the prescribed SSI that is in her pump.  Reviewed current A1c of 12% with pt and discussed the importance of god CBG control at home.  Pt told me she has been having troubles managing her CBGs at home and attributes her high A1c to her poor diet.  Told me she recently saw the C[facility] at [facility] a week ago for further diabetes education and is trying to do better.  Encouraged pt to try to follow her prescribed nutrition plan.  Discussed with pt that the medical team can manage her glucose levels off the pump (per her wishes) but that we will likely use different insulin in different doses than she uses at home.  Discussed with patient that we will monitor her CBGs and make  adjustments as needed.  Also discussed w/ pt that she can resume her Omni Pod insulin pump when she goes home.  Pt agreeable to plan and thanked me for helping her.  Pt has an appt with her ENDO Dr. [name] in December.   --Will follow patient during hospitalization--  Wyn Quaker RN, MSN, CDE Diabetes Coordinator Inpatient Glycemic Control Team Team Pager: [phone] (8a-5p)

## 2019-03-12 NOTE — H&P (View-Only) (Signed)
GI  Covering for Dr. Benson Norway and Dr. Collene Mares   Progress Note    ASSESSMENT AND PLAN:   81. 68 yo female with CAD, poorly controlled DM, obesity, [place] of colon cancer, and diverticulosis who presented to ED yesterday with hematochezia ( black - burgundy). No further BMs / bleeding since admission -Suspicion is for lower GI bleeding though she had black stool at onset of bleeding. She needs both EGD and colonoscopy. This would have been done today but her blood glucose level was > 300 increasing risk of procedures.  -Blood glucose controlled today. Will plan for procedures to be done in am. The risks and benefits of colonoscopy and EGD with biopsies were discussed and the patient agrees to proceed.   2. Acute on chronic normocytic anemia. Baseline hgb ~ 10.6, down to 9.7 today. Should stablize since bleeding has ceased  3. Passage of gas through vagina. Patient has been doing this for a year but too embarrassed to tell anyone. Denies passage of stool through vagina. Small rectovaginal fistula?      SUBJECTIVE   No further bleeding. Feels okay.   OBJECTIVE:     Vital signs in last 24 hours: Temp:  [97.7 F (36.5 C)-98.1 F (36.7 C)] 98.1 F (36.7 C) (10/17 1143) Pulse Rate:  [66-76] 76 (10/17 1143) Resp:  [14-20] 16 (10/17 1143) BP: (127-161)/(38-79) 127/38 (10/17 1143) SpO2:  [94 %-99 %] 94 % (10/17 1143) Weight:  [118.3 kg] 118.3 kg (10/17 0510) Last BM Date: 03/10/19 General:   Alert,  female in NAD EENT:  Normal hearing, non icteric sclera, conjunctive pink.  Heart:  Regular rate and rhythm;  No lower extremity edema   Pulm: Normal respiratory effort, lungs CTA bilaterally without wheezes or crackles. Abdomen:  Soft, nondistended, nontender.  Normal bowel sounds.          Neurologic:  Alert and  oriented x4;  grossly normal neurologically. Psych:  Pleasant, cooperative.  Normal mood and affect.   Intake/Output from previous day: 10/16 0701 - 10/17 0700 In: 1408.3  [P.O.:480; I.V.:928.3] Out: 800 [Urine:800] Intake/Output this shift: Total I/O In: 594.6 [P.O.:480; I.V.:114.6] Out: 700 [Urine:700]  Lab Results: Recent Labs    03/11/19 0624 03/11/19 1740 03/12/19 0523  WBC 12.1*  --  5.7  HGB 10.8* 10.1* 9.7*  HCT 35.3* 31.3* 30.3*  PLT 231  --  187   BMET Recent Labs    03/11/19 0624 03/11/19 1740 03/12/19 0523  NA 134* 135 140  K 5.2* 4.1 3.7  CL 97* 99 104  CO2 24 25 26   GLUCOSE 413* 306* 85  BUN 66* 68* 64*  CREATININE 2.90* 2.14* 1.92*  CALCIUM 8.6* 8.5* 8.6*   LFT Recent Labs    03/11/19 0624  PROT 5.7*  ALBUMIN 3.0*  AST 17  ALT 17  ALKPHOS 125  BILITOT 0.3   PT/INR No results for input(s): LABPROT, INR in the last 72 hours. Hepatitis Panel No results for input(s): HEPBSAG, HCVAB, HEPAIGM, HEPBIGM in the last 72 hours.  Ct Abdomen Pelvis Wo Contrast  Result Date: 03/11/2019 CLINICAL DATA:  Abdominal pain, dark black watery stools with nausea. History of recent antibiotic administration. EXAM: CT ABDOMEN AND PELVIS WITHOUT CONTRAST TECHNIQUE: Multidetector CT imaging of the abdomen and pelvis was performed following the standard protocol without IV contrast. COMPARISON:  No CT comparison is available. Most recent abdominal comparison is renal sonogram from October of 2019. FINDINGS: Lower chest: Signs of coronary artery disease and prior percutaneous coronary intervention.  No signs of pericardial effusion. No basilar consolidation or pleural effusion. Heart is incompletely imaged. Hepatobiliary: Non-contrast appearance of the liver is unremarkable. Dense gallstones are present in the proximal gallbladder. No signs of pericholecystic fluid or stranding. Pancreas: Unremarkable. No pancreatic ductal dilatation or surrounding inflammatory changes. A Spleen: Normal in size without focal abnormality. Adrenals/Urinary Tract: Normal appearance of bilateral adrenal glands. Mild perinephric stranding bilaterally. No signs of  hydronephrosis. No visible nephro or ureterolithiasis. Mild renal cortical scarring with relative symmetry. Stomach/Bowel: Pancolonic diverticulosis. No signs of acute diverticulitis. Normal appendix. No signs of bowel obstruction. Vascular/Lymphatic: Atherosclerotic changes of the abdominal aorta. No signs of aneurysmal dilation. No signs of upper abdominal or retroperitoneal lymphadenopathy. No signs of pelvic adenopathy. Reproductive: Post hysterectomy. Other: No signs of free air. No focal fluid collection. No ascites. Musculoskeletal: No acute bone finding or destructive bone process. Spinal degenerative changes. IMPRESSION: 1. No cause for the patient's symptoms identified. 2. Cholelithiasis. Pancolonic diverticulosis. 3. Atherosclerosis in the abdominal aorta. Aortic Atherosclerosis (ICD10-I70.0). Electronically Signed   By: Zetta Bills M.D.   On: 03/11/2019 12:48   Dg Chest 2 View  Result Date: 03/11/2019 CLINICAL DATA:  Left-sided chest pain. Abdominal pain, nausea, and diarrhea. EXAM: CHEST - 2 VIEW COMPARISON:  03/18/2018 FINDINGS: The heart size and mediastinal contours are within normal limits. Aortic atherosclerosis. Both lungs are clear. The visualized skeletal structures are unremarkable. IMPRESSION: No active cardiopulmonary disease. Electronically Signed   By: Marlaine Hind M.D.   On: 03/11/2019 07:11     Principal Problem:   GI bleed Active Problems:   Essential hypertension   Acute kidney injury superimposed on chronic kidney disease (HCC)   Diabetes mellitus type II, uncontrolled (HCC)   Chest pain   Hypothyroid   Hyperkalemia   Prolonged QT interval   Acute blood loss anemia     LOS: 0 days   Tye Savoy ,NP 03/12/2019, 1:29 PM

## 2019-03-12 NOTE — Evaluation (Signed)
Physical Therapy Evaluation Patient Details Name: Susan [name] MRN: [id number] DOB: Jun 01, 1950 Today's Date: 03/12/2019   History of Present Illness  68 y.o. female with medical history significant of HTN, HLD, diastolic CHF with EF 62-22% with grade 1dfx, DM type II on insulin pump, chronic kidney disease, CAD, remote history of tobacco use, R foot transmet amputation and CVA. She was admitted for GI bleed.    Clinical Impression  Pt admitted with above diagnosis. On eval, pt required min guard assist bed mobility, min assist transfers and min assist ambulation 90' with RW. Pt presented with very unsteady gait, primarily due to not having her shoes (previous R transmet amp).  Pt currently with functional limitations due to the deficits listed below (see PT Problem List). Pt will benefit from skilled PT to increase their independence and safety with mobility to allow discharge to the venue listed below.       Follow Up Recommendations Home health PT;Supervision for mobility/OOB    Equipment Recommendations  None recommended by PT    Recommendations for Other Services       Precautions / Restrictions Precautions Precautions: Fall Restrictions Other Position/Activity Restrictions: Ambulates with R orthopedic shoe due to transmet amp. Pt does not have shoe here at the hospital.      Mobility  Bed Mobility Overal bed mobility: Needs Assistance Bed Mobility: Supine to Sit     Supine to sit: Min guard;HOB elevated     General bed mobility comments: +rail, min guard for safety  Transfers Overall transfer level: Needs assistance Equipment used: Rolling walker (2 wheeled) Transfers: Sit to/from [facility] Sit to Stand: Min assist Stand pivot transfers: Min assist       General transfer comment: increased time to power up and stabilize balance  Ambulation/Gait Ambulation/Gait assistance: Min assist Gait Distance (Feet): 90 Feet Assistive device: Rolling  walker (2 wheeled) Gait Pattern/deviations: Step-through pattern;Decreased stride length Gait velocity: decreased Gait velocity interpretation: <1.8 ft/sec, indicate of risk for recurrent falls General Gait Details: very unsteady gait. Frequent 'wobbling/buckling' RLE and pt tipping RW over to one side. Assist needed to maintain balance. Recommend chair follow for safety in hallway.  Stairs            Wheelchair Mobility    Modified Rankin (Stroke Patients Only)       Balance Overall balance assessment: Needs assistance Sitting-balance support: No upper extremity supported;Feet supported Sitting balance-Leahy Scale: Good     Standing balance support: Bilateral upper extremity supported;During functional activity Standing balance-Leahy Scale: Poor Standing balance comment: reliant on BUE and external support. Pt typically wears orthopedic shoes for amb due to R foot transmet amputation. She does not have shoes with her. Pt wore hard sole bedroom shoes for eval.                             Pertinent Vitals/Pain Pain Assessment: No/denies pain    Home Living Family/patient expects to be discharged to:: Private residence Living Arrangements: Spouse/significant other Available Help at Discharge: Family;Available PRN/intermittently(husband works) Type of Home: House Home Access: [place]: One [place]: Grab bars - tub/shower;Walker - 2 wheels;Walker - 4 wheels;Cane - single point;Wheelchair - manual      Prior Function Level of Independence: Independent with assistive device(s)         Comments: ambulates with RW. Pt reports multi falls.     Hand Dominance  Dominant Hand: Right    Extremity/Trunk Assessment   Upper Extremity Assessment Upper Extremity Assessment: Overall WFL for tasks assessed    Lower Extremity Assessment Lower Extremity Assessment: Generalized weakness    Cervical / Trunk  Assessment Cervical / Trunk Assessment: Normal  Communication   Communication: No difficulties  Cognition Arousal/Alertness: Awake/alert Behavior During Therapy: WFL for tasks assessed/performed Overall Cognitive Status: Within Functional Limits for tasks assessed                                 General Comments: mild confusion noted. Very fortgetful      General Comments      Exercises     Assessment/Plan    PT Assessment Patient needs continued PT services  PT Problem List Decreased strength;Decreased mobility;Decreased safety awareness;Decreased activity tolerance;Decreased balance       PT Treatment Interventions Therapeutic activities;Gait training;Therapeutic exercise;Patient/family education;Balance training;Functional mobility training    PT Goals (Current goals can be found in the Care Plan section)  Acute Rehab PT Goals Patient Stated Goal: get stronger PT Goal Formulation: With patient Time For Goal Achievement: 03/26/19 Potential to Achieve Goals: Good    Frequency Min 3X/week   Barriers to discharge        Co-evaluation               AM-PAC PT "6 Clicks" Mobility  Outcome Measure Help needed turning from your back to your side while in a flat bed without using bedrails?: None Help needed moving from lying on your back to sitting on the side of a flat bed without using bedrails?: A Little Help needed moving to and from a bed to a chair (including a wheelchair)?: A Little Help needed standing up from a chair using your arms (e.g., wheelchair or bedside chair)?: A Little Help needed to walk in hospital room?: A Little Help needed climbing 3-5 steps with a railing? : A Lot 6 Click Score: 18    End of Session Equipment Utilized During Treatment: Gait belt Activity Tolerance: Patient tolerated treatment well Patient left: in chair;with call bell/phone within reach;with chair alarm set Nurse Communication: Mobility status PT Visit  Diagnosis: Unsteadiness on feet (R26.81);Muscle weakness (generalized) (M62.81)    Time: 3546-5681 PT Time Calculation (min) (ACUTE ONLY): 40 min   Charges:   PT Evaluation $PT Eval Moderate Complexity: 1 Mod PT Treatments $Gait Training: 23-37 mins        Susan [name], PT  Office # [phone] Pager [phone]   Susan [name] 03/12/2019, 2:43 PM

## 2019-03-12 NOTE — Progress Notes (Signed)
PROGRESS NOTE    Jenevieve Kirschbaum  RKY:[id number] DOB: 1951/03/13 DOA: 03/11/2019 PCP: Alycia Rossetti, MD  Brief Narrative:Kadeisha Hoeg is a 68 y.o. female with medical history significant of HTN, HLD, diastolic CHF with EF 31-51% with grade 1dfx, DM type II, chronic kidney disease, CAD, remote history of tobacco use, and CVA.    Presented to the emergency room 10/16 with black stools that started the same morning  -In addition CBGs have been uncontrolled, reports despite being on a insulin pump blood sugars have been in the 400-500s. -  hemoglobin 10.8,   Assessment & Plan:   Melena, heme positive stools,  -Hemoglobin has been trended down from 11 range at baseline to 9.7 now  -She takes aspirin and Plavix at baseline for history of CAD, remote stents, both are on hold  -Currently n.p.o. awaiting gastroenterology evaluation for possible endoscopy  -Monitor hemoglobin  Hyperkalemia:  -Resolved  Uncontrolled insulin-dependent diabetes mellitus with hyperglycemia -CBGs greater than 400 on admission, reportedly running in 4-500 range at home -Last hemoglobin A1c was 8.3 from 10 months ago, now 12.0 despite insulin pump, unclear if this is dietary or whether her pump is not working well -She is followed by Dr. [name] with endocrinology -Diabetes coordinator consulted, recommended to transition to Lantus and sliding scale since her pump medicines are about to expire  Acute kidney injury on chronic kidney disease stage III -Creatinine on admission is 2.9, baseline is closer to 1.8 -Improving with hydration, continue gentle IV fluids for few more hours today, will be n.p.o. awaiting endoscopy as well  Chronic diastolic CHF: -Clinically appears euvolemic at this time, diuretics on hold, monitor  Hypothyroidism: -Continue levothyroxine  Essential hypertension: Blood pressure currently stable. -Continue Coreg  Yeast infection:  -Given Diflucan x1 yesterday this is following recent  UTI treated with antibiotics   Impaired ambulation secondary to right foot transmetatarsal amputation -PT evaluation  Hyperlipidemia -Continue Crestor  DVT prophylaxis: SCD Code Status: Full Family Communication: No family present at bedside Disposition Plan:  Home pending above work-up  Consultants:   Gastroenterology Dr. Carol Ada   Procedures:   Antimicrobials:    Subjective: -Feels better today, denies any bleeding overnight CBGs are better controlled  Objective: Vitals:   03/11/19 1739 03/11/19 2022 03/12/19 0510 03/12/19 0920  BP: (!) 141/65 (!) 157/71 (!) 127/56 (!) 146/51  Pulse: 76 74 74 76  Resp: 20 18 18 14   Temp: 97.7 F (36.5 C) 97.9 F (36.6 C) 97.8 F (36.6 C) 97.9 F (36.6 C)  TempSrc: Oral Oral Oral Oral  SpO2: 98% 99% 96% 96%  Weight:   118.3 kg     Intake/Output Summary (Last 24 hours) at 03/12/2019 1100 Last data filed at 03/12/2019 1000 Gross per 24 hour  Intake 1408.33 ml  Output 1500 ml  Net -91.67 ml   Filed Weights   03/12/19 0510  Weight: 118.3 kg    Examination:  General exam: Obese chronically ill pleasant female sitting up in bed AAO x3, no distress Respiratory system: Clear to auscultation. Respiratory effort normal. Cardiovascular system: S1 & S2 heard, RRR Gastrointestinal system: Abdomen is nondistended, soft and nontender.Normal bowel sounds heard. Central nervous system: Alert and oriented. No focal neurological deficits. Extremities: No edema, right foot transmetatarsal amputation Skin: No rashes Psychiatry: . Mood & affect appropriate.     Data Reviewed:   CBC: Recent Labs  Lab 03/11/19 0624 03/11/19 1740 03/12/19 0523  WBC 12.1*  --  5.7  NEUTROABS  --   --  3.9  HGB 10.8* 10.1* 9.7*  HCT 35.3* 31.3* 30.3*  MCV 84.9  --  82.6  PLT 231  --  417   Basic Metabolic Panel: Recent Labs  Lab 03/11/19 0624 03/11/19 1740 03/12/19 0523  NA 134* 135 140  K 5.2* 4.1 3.7  CL 97* 99 104  CO2 24 25  26   GLUCOSE 413* 306* 85  BUN 66* 68* 64*  CREATININE 2.90* 2.14* 1.92*  CALCIUM 8.6* 8.5* 8.6*   GFR: Estimated Creatinine Clearance: 36.2 mL/min (A) (by C-G formula based on SCr of 1.92 mg/dL (H)). Liver Function Tests: Recent Labs  Lab 03/11/19 0624  AST 17  ALT 17  ALKPHOS 125  BILITOT 0.3  PROT 5.7*  ALBUMIN 3.0*   No results for input(s): LIPASE, AMYLASE in the last 168 hours. No results for input(s): AMMONIA in the last 168 hours. Coagulation Profile: No results for input(s): INR, PROTIME in the last 168 hours. Cardiac Enzymes: No results for input(s): CKTOTAL, CKMB, CKMBINDEX, TROPONINI in the last 168 hours. BNP (last 3 results) No results for input(s): PROBNP in the last 8760 hours. HbA1C: Recent Labs    03/11/19 1740  HGBA1C 12.0*   CBG: Recent Labs  Lab 03/11/19 1621 03/11/19 1728 03/11/19 2108 03/12/19 0630  GLUCAP 334* 315* 201* 95   Lipid Profile: No results for input(s): CHOL, HDL, LDLCALC, TRIG, CHOLHDL, LDLDIRECT in the last 72 hours. Thyroid Function Tests: No results for input(s): TSH, T4TOTAL, FREET4, T3FREE, THYROIDAB in the last 72 hours. Anemia Panel: No results for input(s): VITAMINB12, FOLATE, FERRITIN, TIBC, IRON, RETICCTPCT in the last 72 hours. Urine analysis:    Component Value Date/Time   COLORURINE STRAW (A) 03/11/2019 1339   APPEARANCEUR CLEAR 03/11/2019 1339   LABSPEC 1.008 03/11/2019 1339   PHURINE 5.0 03/11/2019 1339   GLUCOSEU >=500 (A) 03/11/2019 1339   HGBUR LARGE (A) 03/11/2019 1339   BILIRUBINUR NEGATIVE 03/11/2019 1339   KETONESUR NEGATIVE 03/11/2019 1339   PROTEINUR 30 (A) 03/11/2019 1339   UROBILINOGEN 0.2 04/01/2014 1659   NITRITE NEGATIVE 03/11/2019 1339   LEUKOCYTESUR NEGATIVE 03/11/2019 1339   Sepsis Labs: @LABRCNTIP (procalcitonin:4,lacticidven:4)  ) Recent Results (from the past 240 hour(s))  SARS CORONAVIRUS 2 (TAT 6-24 HRS)     Status: None   Collection Time: 03/11/19  1:39 PM  Result Value Ref  Range Status   SARS Coronavirus 2 NEGATIVE NEGATIVE Final    Comment: (NOTE) SARS-CoV-2 target nucleic acids are NOT DETECTED. The SARS-CoV-2 RNA is generally detectable in upper and lower respiratory specimens during the acute phase of infection. Negative results do not preclude SARS-CoV-2 infection, do not rule out co-infections with other pathogens, and should not be used as the sole basis for treatment or other patient management decisions. Negative results must be combined with clinical observations, patient history, and epidemiological information. The expected result is Negative. Fact Sheet for Patients: SugarRoll.be Fact Sheet for Healthcare Providers: https://www.woods-mathews.com/ This test is not yet approved or cleared by the [place] FDA and  has been authorized for detection and/or diagnosis of SARS-CoV-2 by FDA under an Emergency Use Authorization (EUA). This EUA will remain  in effect (meaning this test can be used) for the duration of the COVID-19 declaration under Section 56 4(b)(1) of the Act, 21 U.S.C. section 360bbb-3(b)(1), unless the authorization is terminated or revoked sooner. Performed at [facility], [place] [address], [place], [place] [address]          Radiology Studies: Ct Abdomen Pelvis Wo  Contrast  Result Date: 03/11/2019 CLINICAL DATA:  Abdominal pain, dark black watery stools with nausea. History of recent antibiotic administration. EXAM: CT ABDOMEN AND PELVIS WITHOUT CONTRAST TECHNIQUE: Multidetector CT imaging of the abdomen and pelvis was performed following the standard protocol without IV contrast. COMPARISON:  No CT comparison is available. Most recent abdominal comparison is renal sonogram from October of 2019. FINDINGS: Lower chest: Signs of coronary artery disease and prior percutaneous coronary intervention. No signs of pericardial effusion. No basilar consolidation or pleural effusion.  Heart is incompletely imaged. Hepatobiliary: Non-contrast appearance of the liver is unremarkable. Dense gallstones are present in the proximal gallbladder. No signs of pericholecystic fluid or stranding. Pancreas: Unremarkable. No pancreatic ductal dilatation or surrounding inflammatory changes. A Spleen: Normal in size without focal abnormality. Adrenals/Urinary Tract: Normal appearance of bilateral adrenal glands. Mild perinephric stranding bilaterally. No signs of hydronephrosis. No visible nephro or ureterolithiasis. Mild renal cortical scarring with relative symmetry. Stomach/Bowel: Pancolonic diverticulosis. No signs of acute diverticulitis. Normal appendix. No signs of bowel obstruction. Vascular/Lymphatic: Atherosclerotic changes of the abdominal aorta. No signs of aneurysmal dilation. No signs of upper abdominal or retroperitoneal lymphadenopathy. No signs of pelvic adenopathy. Reproductive: Post hysterectomy. Other: No signs of free air. No focal fluid collection. No ascites. Musculoskeletal: No acute bone finding or destructive bone process. Spinal degenerative changes. IMPRESSION: 1. No cause for the patient's symptoms identified. 2. Cholelithiasis. Pancolonic diverticulosis. 3. Atherosclerosis in the abdominal aorta. Aortic Atherosclerosis (ICD10-I70.0). Electronically Signed   By: Zetta Bills M.D.   On: 03/11/2019 12:48   Dg Chest 2 View  Result Date: 03/11/2019 CLINICAL DATA:  Left-sided chest pain. Abdominal pain, nausea, and diarrhea. EXAM: CHEST - 2 VIEW COMPARISON:  03/18/2018 FINDINGS: The heart size and mediastinal contours are within normal limits. Aortic atherosclerosis. Both lungs are clear. The visualized skeletal structures are unremarkable. IMPRESSION: No active cardiopulmonary disease. Electronically Signed   By: Marlaine Hind M.D.   On: 03/11/2019 07:11        Scheduled Meds: . carvedilol  12.5 mg Oral BID WC  . ferrous sulfate  325 mg Oral BID WC  . insulin aspart   0-15 Units Subcutaneous TID WC  . insulin aspart  0-5 Units Subcutaneous QHS  . insulin glargine  12 Units Subcutaneous Daily  . levothyroxine  50 mcg Oral QAC breakfast  . magnesium oxide  400 mg Oral q morning - 10a  . pantoprazole (PROTONIX) IV  40 mg Intravenous Q12H  . pregabalin  150 mg Oral BID  . rosuvastatin  20 mg Oral q morning - 10a  . sodium chloride flush  3 mL Intravenous Q12H   Continuous Infusions: . sodium chloride 75 mL/hr at 03/12/19 0945     LOS: 0 days    Time spent: 61min    Domenic Polite, MD [facility] Page via [email or web address], password [email or web address] After 7PM please contact night-coverage  03/12/2019, 11:00 AM

## 2019-03-13 ENCOUNTER — Inpatient Hospital Stay (HOSPITAL_COMMUNITY): Payer: PPO | Admitting: Certified Registered Nurse Anesthetist

## 2019-03-13 ENCOUNTER — Encounter (HOSPITAL_COMMUNITY): Payer: Self-pay | Admitting: Internal Medicine

## 2019-03-13 ENCOUNTER — Encounter (HOSPITAL_COMMUNITY)
Admission: EM | Disposition: A | Discharge status: Home Health | Payer: Self-pay | Source: Home / Self Care | Attending: Internal Medicine

## 2019-03-13 DIAGNOSIS — K2970 Gastritis, unspecified, without bleeding: Secondary | ICD-10-CM

## 2019-03-13 DIAGNOSIS — D122 Benign neoplasm of ascending colon: Secondary | ICD-10-CM

## 2019-03-13 DIAGNOSIS — K635 Polyp of colon: Secondary | ICD-10-CM

## 2019-03-13 DIAGNOSIS — D125 Benign neoplasm of sigmoid colon: Secondary | ICD-10-CM

## 2019-03-13 DIAGNOSIS — K317 Polyp of stomach and duodenum: Secondary | ICD-10-CM

## 2019-03-13 DIAGNOSIS — D123 Benign neoplasm of transverse colon: Secondary | ICD-10-CM

## 2019-03-13 DIAGNOSIS — K921 Melena: Secondary | ICD-10-CM

## 2019-03-13 DIAGNOSIS — D124 Benign neoplasm of descending colon: Secondary | ICD-10-CM

## 2019-03-13 HISTORY — PX: COLONOSCOPY WITH PROPOFOL: SHX5780

## 2019-03-13 HISTORY — PX: POLYPECTOMY: SHX5525

## 2019-03-13 HISTORY — PX: ESOPHAGOGASTRODUODENOSCOPY (EGD) WITH PROPOFOL: SHX5813

## 2019-03-13 HISTORY — PX: HEMOSTASIS CLIP PLACEMENT: SHX6857

## 2019-03-13 LAB — BASIC METABOLIC PANEL
Anion gap: 13 (ref 5–15)
BUN: 50 mg/dL — ABNORMAL HIGH (ref 8–23)
CO2: 23 mmol/L (ref 22–32)
Calcium: 9 mg/dL (ref 8.9–10.3)
Chloride: 103 mmol/L (ref 98–111)
Creatinine, Ser: 1.61 mg/dL — ABNORMAL HIGH (ref 0.44–1.00)
GFR calc Af Amer: 37 mL/min — ABNORMAL LOW (ref >60)
GFR calc non Af Amer: 32 mL/min — ABNORMAL LOW (ref >60)
Glucose, Bld: 261 mg/dL — ABNORMAL HIGH (ref 70–99)
Potassium: 4 mmol/L (ref 3.5–5.1)
Sodium: 139 mmol/L (ref 135–145)

## 2019-03-13 LAB — CBC
HCT: 33.8 % — ABNORMAL LOW (ref 36.0–46.0)
Hemoglobin: 10.6 g/dL — ABNORMAL LOW (ref 12.0–15.0)
MCH: 26.2 pg (ref 26.0–34.0)
MCHC: 31.4 g/dL (ref 30.0–36.0)
MCV: 83.7 fL (ref 80.0–100.0)
Platelets: 187 10*3/uL (ref 150–400)
RBC: 4.04 MIL/uL (ref 3.87–5.11)
RDW: 15.7 % — ABNORMAL HIGH (ref 11.5–15.5)
WBC: 5.4 10*3/uL (ref 4.0–10.5)
nRBC: 0 % (ref 0.0–0.2)

## 2019-03-13 LAB — GLUCOSE, CAPILLARY
Glucose-Capillary: 271 mg/dL — ABNORMAL HIGH (ref 70–99)
Glucose-Capillary: 212 mg/dL — ABNORMAL HIGH (ref 70–99)
Glucose-Capillary: 256 mg/dL — ABNORMAL HIGH (ref 70–99)
Glucose-Capillary: 267 mg/dL — ABNORMAL HIGH (ref 70–99)

## 2019-03-13 LAB — HEMOGLOBIN AND HEMATOCRIT, BLOOD
HCT: 31.1 % — ABNORMAL LOW (ref 36.0–46.0)
Hemoglobin: 9.9 g/dL — ABNORMAL LOW (ref 12.0–15.0)

## 2019-03-13 SURGERY — ESOPHAGOGASTRODUODENOSCOPY (EGD) WITH PROPOFOL
Anesthesia: Monitor Anesthesia Care

## 2019-03-13 MED ORDER — SODIUM CHLORIDE 0.9 % IV SOLN
INTRAVENOUS | Status: DC | PRN
  Administered 2019-03-13: 25 ug/min via INTRAVENOUS

## 2019-03-13 MED ORDER — PROPOFOL 10 MG/ML IV BOLUS
INTRAVENOUS | Status: DC | PRN
  Administered 2019-03-13 (×3): 10 mg via INTRAVENOUS
  Administered 2019-03-13: 20 mg via INTRAVENOUS
  Administered 2019-03-13: 10 mg via INTRAVENOUS
  Administered 2019-03-13: 20 mg via INTRAVENOUS
  Administered 2019-03-13: 10 mg via INTRAVENOUS

## 2019-03-13 MED ORDER — PHENYLEPHRINE 40 MCG/ML (10ML) SYRINGE FOR IV PUSH (FOR BLOOD PRESSURE SUPPORT)
PREFILLED_SYRINGE | INTRAVENOUS | Status: DC | PRN
  Administered 2019-03-13: 40 ug via INTRAVENOUS
  Administered 2019-03-13: 80 ug via INTRAVENOUS

## 2019-03-13 MED ORDER — SODIUM CHLORIDE 0.9 % IV SOLN
INTRAVENOUS | Status: AC | PRN
  Administered 2019-03-13: 500 mL via INTRAVENOUS

## 2019-03-13 MED ORDER — PROPOFOL 500 MG/50ML IV EMUL
INTRAVENOUS | Status: DC | PRN
  Administered 2019-03-13: 100 ug/kg/min via INTRAVENOUS

## 2019-03-13 SURGICAL SUPPLY — 25 items
BLOCK BITE 60FR ADLT L/F BLUE (MISCELLANEOUS) ×3 IMPLANT
ELECT REM PT RETURN 9FT ADLT (ELECTROSURGICAL)
ELECTRODE REM PT RTRN 9FT ADLT (ELECTROSURGICAL) IMPLANT
FCP BXJMBJMB 240X2.8X (CUTTING FORCEPS)
FLOOR PAD 36X40 (MISCELLANEOUS) ×3
FORCEP RJ3 GP 1.8X160 W-NEEDLE (CUTTING FORCEPS) IMPLANT
FORCEPS BIOP RAD 4 LRG CAP 4 (CUTTING FORCEPS) IMPLANT
FORCEPS BIOP RJ4 240 W/NDL (CUTTING FORCEPS)
FORCEPS BXJMBJMB 240X2.8X (CUTTING FORCEPS) IMPLANT
INJECTOR/SNARE I SNARE (MISCELLANEOUS) IMPLANT
LUBRICANT JELLY 4.5OZ STERILE (MISCELLANEOUS) IMPLANT
MANIFOLD NEPTUNE II (INSTRUMENTS) IMPLANT
NDL SCLEROTHERAPY 25GX240 (NEEDLE) IMPLANT
NEEDLE SCLEROTHERAPY 25GX240 (NEEDLE) IMPLANT
PAD FLOOR 36X40 (MISCELLANEOUS) ×2 IMPLANT
PROBE APC STR FIRE (PROBE) IMPLANT
PROBE INJECTION GOLD (MISCELLANEOUS)
PROBE INJECTION GOLD 7FR (MISCELLANEOUS) IMPLANT
SNARE ROTATE MED OVAL 20MM (MISCELLANEOUS) IMPLANT
SNARE SHORT THROW 13M SML OVAL (MISCELLANEOUS) IMPLANT
SYR 50ML LL SCALE MARK (SYRINGE) IMPLANT
TRAP SPECIMEN MUCOUS 40CC (MISCELLANEOUS) IMPLANT
TUBING ENDO SMARTCAP PENTAX (MISCELLANEOUS) ×6 IMPLANT
TUBING IRRIGATION ENDOGATOR (MISCELLANEOUS) ×3 IMPLANT
WATER STERILE IRR 1000ML POUR (IV SOLUTION) IMPLANT

## 2019-03-13 NOTE — Progress Notes (Signed)
Inpatient Diabetes Program Recommendations  AACE/ADA: New Consensus Statement on Inpatient Glycemic Control (2015)  Target Ranges:  Prepandial:   less than 140 mg/dL      Peak postprandial:   less than 180 mg/dL (1-2 hours)      Critically ill patients:  140 - 180 mg/dL   Results for MILLA, WAHLBERG (MRN [id number]) as of 03/13/2019 15:27  Ref. Range 03/12/2019 06:30 03/12/2019 11:37 03/12/2019 17:11 03/12/2019 21:11  Glucose-Capillary Latest Ref Range: 70 - 99 mg/dL 95 107 (H)    12 units LANTUS 303 (H)  11 units NOVOLOG  287 (H)  3 units NOVOLOG    Results for DOROTHIE, WAH (MRN [id number]) as of 03/13/2019 15:27  Ref. Range 03/13/2019 05:54 03/13/2019 13:46  Glucose-Capillary Latest Ref Range: 70 - 99 mg/dL 271 (H)  8 units NOVOLOG +  12 units LANTUS given at 9am 212 (H)  5 units NOVOLOG      Admit GIB/ CP  History: DM2, CVA, CKD, CHF   Home DM Meds: NPH Insulin 10 units AM if CBG 100-199 or 15 units AM if CBG >200                                                 Regular Insulin with the Omni Pod Dash Insulin Pump (80-150 give 7 units, 151-200 give 9 units, 201-250 give 12 units, 251-300 give 14 units, 301-400 give 17 units)--Looks like she's using the Omni Pump for boluses only   Current Orders: Novolog Moderate Correction Scale/ SSI (0-15 units) TID AC + HS                           Lantus 12 units Daily    ENDO: Dr. [name]     Patient told the Diabetes Coordinator on Saturday (10/17) that she desired to Stop her Insulin Pump and she would like for [name] to manage her CBGs with SQ Insulin in the hospital.  Is agreeable to resuming her Insulin Pump when she goes home.  Lantus and Novolog SSI started yesterday.  CBGs elevated.     MD- Please consider the following in-hospital insulin adjustments:  1. Increase Lantus to 15 units Daily  2. If patient consuming PO diet adequately, Start Novolog Meal Coverage:  Novolog 4 units TID with meals  (Please  add the following Hold Parameters: Hold if pt eats <50% of meal, Hold if pt NPO)     --Will follow patient during hospitalization--  Wyn Quaker RN, MSN, CDE Diabetes Coordinator Inpatient Glycemic Control Team Team Pager: [phone] (8a-5p)

## 2019-03-13 NOTE — Interval H&P Note (Signed)
History and Physical Interval Note: For EGD and colon today to eval melena, anemia The nature of the procedure, as well as the risks, benefits, and alternatives were carefully and thoroughly reviewed with the patient. Ample time for discussion and questions allowed. The patient understood, was satisfied, and agreed to proceed.   CBC Latest Ref Rng & Units 03/13/2019 03/12/2019 03/11/2019  WBC 4.0 - 10.5 K/uL 5.4 5.7 -  Hemoglobin 12.0 - 15.0 g/dL 10.6(L) 9.7(L) 10.1(L)  Hematocrit 36.0 - 46.0 % 33.8(L) 30.3(L) 31.3(L)  Platelets 150 - 400 K/uL 187 187 -    03/13/2019 11:14 AM  [name]  has presented today for surgery, with the diagnosis of hematochezia.  The various methods of treatment have been discussed with the patient and family. After consideration of risks, benefits and other options for treatment, the patient has consented to  Procedure(s): ESOPHAGOGASTRODUODENOSCOPY (EGD) WITH PROPOFOL (N/A) COLONOSCOPY WITH PROPOFOL (N/A) as a surgical intervention.  The patient's history has been reviewed, patient examined, no change in status, stable for surgery.  I have reviewed the patient's chart and labs.  Questions were answered to the patient's satisfaction.     Lajuan Lines Chalese Peach

## 2019-03-13 NOTE — Op Note (Signed)
[facility] Patient Name: Susan [name] Procedure Date : 03/13/2019 MRN: [id number] Attending MD: Jerene Bears , MD Date of Birth: [birth year] CSN: [id number] Age: 68 Admit Type: Inpatient Procedure:                Colonoscopy Indications:              Melena, Acute post hemorrhagic anemia Providers:                Lajuan Lines. Hilarie Fredrickson, MD, Carlyn Reichert, RN, [name],                            Technician, William Dalton, Technician Referring MD:             [facility] Medicines:                Monitored Anesthesia Care Complications:            No immediate complications. Estimated Blood Loss:     Estimated blood loss was minimal. Procedure:                Pre-Anesthesia Assessment:                           - Prior to the procedure, a History and Physical                            was performed, and patient medications and                            allergies were reviewed. The patient's tolerance of                            previous anesthesia was also reviewed. The risks                            and benefits of the procedure and the sedation                            options and risks were discussed with the patient.                            All questions were answered, and informed consent                            was obtained. Prior Anticoagulants: The patient has                            taken Plavix (clopidogrel), last dose was 3 days                            prior to procedure. ASA Grade Assessment: III - A                            patient with severe systemic disease. After  reviewing the risks and benefits, the patient was                            deemed in satisfactory condition to undergo the                            procedure.                           After obtaining informed consent, the colonoscope                            was passed under direct vision. Throughout the   procedure, the patient's blood pressure, pulse, and                            oxygen saturations were monitored continuously. The                            [id number]-[id number] ([id number] Olympus pediatric colonoscope                            was introduced through the anus and advanced to the                            cecum, identified by appendiceal orifice and                            ileocecal valve. The colonoscopy was performed                            without difficulty. The patient tolerated the                            procedure well. The quality of the bowel                            preparation was adequate. The ileocecal valve,                            appendiceal orifice, and rectum were photographed. Scope In: 12:09:31 PM Scope Out: 12:39:53 PM Scope Withdrawal Time: 0 hours 24 minutes 13 seconds  Total Procedure Duration: 0 hours 30 minutes 22 seconds  Findings:      Skin tags were found on perianal exam.      Two sessile polyps were found in the ascending colon. The polyps were 4       to 5 mm in size. For hemostasis, one hemostatic clip was successfully       placed to control minor post-polypectomy, persistent oozing. There was       no bleeding at the end of the maneuver.      Two sessile polyps were found in the transverse colon. The polyps were 3       to 4 mm in size. These polyps were removed with a cold snare. Resection       and retrieval were complete.  A 4 mm polyp was found in the descending colon. The polyp was sessile.       The polyp was removed with a cold snare. Resection and retrieval were       complete.      A 4 mm polyp was found in the sigmoid colon. The polyp was sessile. The       polyp was removed with a cold snare. Resection was complete, but the       polyp tissue was not retrieved.      Multiple small and large-mouthed diverticula were found in the sigmoid       colon, descending colon, transverse colon and hepatic flexure.       Internal hemorrhoids were found during retroflexion. Impression:               - Two 4 to 5 mm polyps in the ascending colon. Clip                            was placed.                           - Two 3 to 4 mm polyps in the transverse colon,                            removed with a cold snare. Resected and retrieved.                           - One 4 mm polyp in the descending colon, removed                            with a cold snare. Resected and retrieved.                           - One 4 mm polyp in the sigmoid colon, removed with                            a cold snare. Complete resection. Polyp tissue not                            retrieved.                           - Moderate diverticulosis in the sigmoid colon, in                            the descending colon, in the transverse colon and                            at the hepatic flexure.                           - Internal hemorrhoids. Moderate Sedation:      N/A Recommendation:           - Return patient to hospital ward for ongoing care.                           -  Resume previous diet.                           - Await pathology results.                           - Repeat colonoscopy date to be determined after                            pending pathology results are reviewed for                            surveillance.                           - See the other procedure note for documentation of                            additional recommendations.                           - Dr. Benson Norway or Dr. Collene Mares to resume care tomorrow. Procedure Code(s):        --- Professional ---                           [phone], 4, Colonoscopy, flexible; with control of                            bleeding, any method                           45385, Colonoscopy, flexible; with removal of                            tumor(s), polyp(s), or other lesion(s) by snare                            technique Diagnosis Code(s):        --- Professional  ---                           K63.5, Polyp of colon                           K64.8, Other hemorrhoids                           K64.4, Residual hemorrhoidal skin tags                           K92.1, Melena (includes Hematochezia)                           D62, Acute posthemorrhagic anemia                           K57.30, Diverticulosis of large intestine without  perforation or abscess without bleeding CPT copyright 2019 American Medical Association. All rights reserved. The codes documented in this report are preliminary and upon coder review may  be revised to meet current compliance requirements. Jerene Bears, MD 03/13/2019 1:12:10 PM This report has been signed electronically. Number of Addenda: 0

## 2019-03-13 NOTE — Anesthesia Postprocedure Evaluation (Signed)
Anesthesia Post Note  Patient: Susan [name]  Procedure(s) Performed: ESOPHAGOGASTRODUODENOSCOPY (EGD) WITH PROPOFOL (N/A ) COLONOSCOPY WITH PROPOFOL (N/A ) POLYPECTOMY FOREIGN BODY REMOVAL HEMOSTASIS CLIP PLACEMENT     Patient location during evaluation: Endoscopy Anesthesia Type: MAC Level of consciousness: awake Pain management: satisfactory to patient Vital Signs Assessment: post-procedure vital signs reviewed and stable Respiratory status: spontaneous breathing Cardiovascular status: stable Postop Assessment: no apparent nausea or vomiting Anesthetic complications: no    Last Vitals:  Vitals:   03/13/19 1339 03/13/19 1341  BP: (!) 172/73 (!) 157/91  Pulse: 81 82  Resp: 18   Temp: (!) 36.3 C   SpO2: 99%     Last Pain:  Vitals:   03/13/19 1339  TempSrc: Oral  PainSc:                  Cortez Flippen

## 2019-03-13 NOTE — Op Note (Signed)
[name] Patient Name: Susan [name] Procedure Date : 03/13/2019 MRN: [id number] Attending MD: Jerene Bears , MD Date of Birth: 1951/03/05 CSN: [id number] Age: 68 Admit Type: Inpatient Procedure:                Upper GI endoscopy Indications:              Acute post hemorrhagic anemia, Melena Providers:                Lajuan Lines. Hilarie Fredrickson, MD, Carlyn Reichert, RN, William Dalton, Technician, [name], Technician Referring MD:             [facility] Medicines:                Monitored Anesthesia Care Complications:            No immediate complications. Estimated Blood Loss:     Estimated blood loss was minimal. Procedure:                Pre-Anesthesia Assessment:                           - Prior to the procedure, a History and Physical                            was performed, and patient medications and                            allergies were reviewed. The patient's tolerance of                            previous anesthesia was also reviewed. The risks                            and benefits of the procedure and the sedation                            options and risks were discussed with the patient.                            All questions were answered, and informed consent                            was obtained. Prior Anticoagulants: The patient has                            taken Plavix (clopidogrel), last dose was 3 days                            prior to procedure. ASA Grade Assessment: III - A                            patient with severe systemic disease. After  reviewing the risks and benefits, the patient was                            deemed in satisfactory condition to undergo the                            procedure.                           After obtaining informed consent, the endoscope was                            passed under direct vision. Throughout the      procedure, the patient's blood pressure, pulse, and                            oxygen saturations were monitored continuously. The                            GIF-H190 ([id number]) Olympus gastroscope was                            introduced through the mouth, and advanced to the                            second part of duodenum. The upper GI endoscopy was                            accomplished without difficulty. The patient                            tolerated the procedure well. Scope In: Scope Out: Findings:      The examined esophagus was normal.      A single 15 mm pedunculated polyp with inflammation was found on the       greater curvature of the gastric body. The polyp was removed with a hot       snare. Resection and retrieval were complete. To stop active bleeding       after polypectomy, four hemostatic clips were successfully placed (2       Resolution and 2 Instinct, MR conditional). There was no bleeding at the       end of the maneuver.      A single 4 mm sessile polyp with no bleeding was found in the gastric       antrum. Given the small nature of this polyp and the bleeding associated       with the above polypectomy, polypectomy of this small lesion was not       performed today.      Mild inflammation characterized by congestion (edema) and erythema was       found in the gastric antrum and in the prepyloric region of the stomach.       The mucosa of the antrum and prepylorus appears       hypertrophied/hyperplastic.      The examined duodenum was normal. Impression:               - Normal  esophagus.                           - A single, large, gastric body polyp. Resected and                            retrieved. Clips (MR conditional) were placed.                            Given the size of this polyp, this is most probable                            source of melena.                           - A single, small, gastric antrum polyp.                            - Gastritis.                           - Normal examined duodenum. Moderate Sedation:      N/A Recommendation:           - Return patient to hospital ward for ongoing care.                           - Advance diet as tolerated.                           - Continue present medications. BID PPI for 4                            weeks, then daily.                           - Await pathology results.                           - Resume Plavix (clopidogrel) at prior dose in 5                            days. Refer to managing physician for further                            adjustment of therapy.                           - Dr. Benson Norway and/or Dr. Collene Mares to resume care tomorrow. Procedure Code(s):        --- Professional ---                           [phone], Esophagogastroduodenoscopy, flexible,                            transoral; with removal of tumor(s), polyp(s), or  other lesion(s) by snare technique Diagnosis Code(s):        --- Professional ---                           K31.7, Polyp of stomach and duodenum                           K29.70, Gastritis, unspecified, without bleeding                           D62, Acute posthemorrhagic anemia                           K92.1, Melena (includes Hematochezia) CPT copyright 2019 American Medical Association. All rights reserved. The codes documented in this report are preliminary and upon coder review may  be revised to meet current compliance requirements. Jerene Bears, MD 03/13/2019 1:06:41 PM This report has been signed electronically. Number of Addenda: 0

## 2019-03-13 NOTE — Anesthesia Preprocedure Evaluation (Signed)
Anesthesia Evaluation  Patient identified by MRN, date of birth, ID band Patient awake    History of Anesthesia Complications (+) PONV  Airway Mallampati: II  TM Distance: >3 FB     Dental   Pulmonary former smoker,    breath sounds clear to auscultation       Cardiovascular hypertension, + angina + CAD, + Past MI, + Peripheral Vascular Disease and +CHF  + dysrhythmias  Rhythm:Regular Rate:Normal     Neuro/Psych    GI/Hepatic Neg liver ROS, History noted.    Endo/Other  diabetesHypothyroidism   Renal/GU Renal disease     Musculoskeletal   Abdominal   Peds  Hematology  (+) anemia ,   Anesthesia Other Findings   Reproductive/Obstetrics                             Anesthesia Physical Anesthesia Plan  ASA: III  Anesthesia Plan: MAC   Post-op Pain Management:    Induction: Intravenous  PONV Risk Score and Plan: 2 and Ondansetron, Dexamethasone and Treatment may vary due to age or medical condition  Airway Management Planned: Nasal Cannula and Simple Face Mask  Additional Equipment:   Intra-op Plan:   Post-operative Plan:   Informed Consent: I have reviewed the patients History and Physical, chart, labs and discussed the procedure including the risks, benefits and alternatives for the proposed anesthesia with the patient or authorized representative who has indicated his/her understanding and acceptance.     Dental advisory given  Plan Discussed with: CRNA and Anesthesiologist  Anesthesia Plan Comments:         Anesthesia Quick Evaluation

## 2019-03-13 NOTE — Transfer of Care (Signed)
Immediate Anesthesia Transfer of Care Note  Patient: [name]  Procedure(s) Performed: ESOPHAGOGASTRODUODENOSCOPY (EGD) WITH PROPOFOL (N/A ) COLONOSCOPY WITH PROPOFOL (N/A ) POLYPECTOMY FOREIGN BODY REMOVAL HEMOSTASIS CLIP PLACEMENT  Patient Location: Endoscopy Unit  Anesthesia Type:MAC  Level of Consciousness: awake, alert  and oriented  Airway & Oxygen Therapy: Patient Spontanous Breathing  Post-op Assessment: Report given to RN and Post -op Vital signs reviewed and stable  Post vital signs: Reviewed and stable  Last Vitals:  Vitals Value Taken Time  BP 116/93 03/13/19 1252  Temp    Pulse 75 03/13/19 1252  Resp 17 03/13/19 1252  SpO2 100 % 03/13/19 1252  Vitals shown include unvalidated device data.  Last Pain:  Vitals:   03/13/19 1101  TempSrc: Oral  PainSc: 0-No pain         Complications: No apparent anesthesia complications

## 2019-03-13 NOTE — Progress Notes (Signed)
PROGRESS NOTE    Decie Verne  PTW:[id number] DOB: Oct 02, 1950 DOA: 03/11/2019 PCP: Alycia Rossetti, MD   Brief Narrative: Patient is a 68 year old Caucasian female, morbidly obese, with past medical history significant for HTN, HLD, diastolic CHF with EF 70-01% with grade 1 diastolic dysfunction, DM type II, chronic kidney disease, CAD, remote history of tobacco use, and CVA.  Patient presented with hematochezia.  On presentation to the hospital, patient's blood sugar was also noted to be uncontrolled.  Patient was admitted for further assessment and management.  Blood sugar control is improving.  Patient has undergone EGD and colonoscopy.  EGD revealed "a single 15 mm pedunculated polyp with inflammation was found on the greater curvature of the gastric body. The polyp was removed with a hot snare. Resection and retrieval were complete. To stop active bleeding after polypectomy, four hemostatic clips were successfully placed".  Colonoscopy revealed "two sessile polyps were found in the ascending colon. The polyps were 4 to 5 mm in size. For hemostasis, one hemostatic clip was successfully placed to control minor post-polypectomy, persistent oozing. There was no bleeding at the end of the maneuver".  Repeat hemoglobin was 9.9 g/dL (down from 10.6 g/dL noted earlier today).  Will continue to monitor H/H for now.  Assessment & Plan:   Melena, heme positive stools,  -Hemoglobin has been trended down from 11 range at baseline to 9.9 grams per deciliter.   -Patient was on aspirin and Plavix at baseline for history of CAD, remote stents, both are on hold  -Patient has undergone EGD and colonoscopy (kindly see above) -Continue to monitor H&H.  Hyperkalemia:  -Resolved  Uncontrolled insulin-dependent diabetes mellitus: -CBGs was greater than 400 on admission. -Last hemoglobin A1c was 8.3 from 10 months ago, now 12.0 despite insulin pump, unclear if this is dietary or whether her pump is not working  well -Patient is followed by Dr. [name] with endocrinology -Diabetes coordinator consulted, recommended to transition to Lantus and sliding scale since her pump medicines are about to expire -Continue to optimize blood sugar control.  Acute kidney injury on chronic kidney disease stage III: -Creatinine on admission was 2.9, baseline is closer to 1.8 -Improved with hydration.   -Serum creatinine today is 1.61.    Chronic diastolic CHF: -Clinically appears euvolemic at this time, diuretics on hold, monitor  Hypothyroidism: -Continue levothyroxine  Essential hypertension: Blood pressure currently stable. 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 Carol Ada   Procedures:   Antimicrobials:    Subjective: -No further hematochezia GI bleed reported. -No chest pain -No shortness of breath  Objective: Vitals:   03/13/19 1310 03/13/19 1315 03/13/19 1339 03/13/19 1341  BP: (!) 148/70  (!) 172/73 (!) 157/91  Pulse: 73  81 82  Resp: 12  18   Temp:   (!) 97.3 F (36.3 C)   TempSrc:   Oral   SpO2: 97% 98% 99%   Weight:      Height:        Intake/Output Summary (Last 24 hours) at 03/13/2019 1450 Last data filed at 03/13/2019 1241 Gross per 24 hour  Intake 1840 ml  Output 450 ml  Net 1390 ml   Filed Weights   03/12/19 0510 03/13/19 0655 03/13/19 1101  Weight: 118.3 kg 119.2 kg 119.2 kg    Examination: General exam: Patient is morbidly obese.  Patient is not in any distress.   Respiratory system: Clear to auscultation.  Cardiovascular system: S1 & S2 heard. Gastrointestinal system: Abdomen is morbidly obese, soft and nontender.  Organs are  difficult to assess.  Central nervous system: Patient is awake and alert.  Patient moves all extremities.   Extremities: Fullness of the ankle.  Right foot transmetatarsal amputation   Data Reviewed:   CBC: Recent Labs  Lab 03/11/19 0624 03/11/19 1740 03/12/19 0523 03/13/19 0623  WBC 12.1*  --  5.7 5.4  NEUTROABS  --   --  3.9  --   HGB 10.8* 10.1* 9.7* 10.6*  HCT 35.3* 31.3* 30.3* 33.8*  MCV 84.9  --  82.6 83.7  PLT 231  --  187 740   Basic Metabolic Panel: Recent Labs  Lab 03/11/19 0624 03/11/19 1740 03/12/19 0523 03/13/19 0623  NA 134* 135 140 139  K 5.2* 4.1 3.7 4.0  CL 97* 99 104 103  CO2 24 25 26 23   GLUCOSE 413* 306* 85 261*  BUN 66* 68* 64* 50*  CREATININE 2.90* 2.14* 1.92* 1.61*  CALCIUM 8.6* 8.5* 8.6* 9.0   GFR: Estimated Creatinine Clearance: 42.6 mL/min (A) (by C-G formula based on SCr of 1.61 mg/dL (H)). Liver Function Tests: Recent Labs  Lab 03/11/19 0624  AST 17  ALT 17  ALKPHOS 125  BILITOT 0.3  PROT 5.7*  ALBUMIN 3.0*   No results for input(s): LIPASE, AMYLASE in the last 168 hours. No results for input(s): AMMONIA in the last 168 hours. Coagulation Profile: No results for input(s): INR, PROTIME in the last 168 hours. Cardiac Enzymes: No results for input(s): CKTOTAL, CKMB, CKMBINDEX, TROPONINI in the last 168 hours. BNP (last 3 results) No results for input(s): PROBNP in the last 8760 hours. HbA1C: Recent Labs    03/11/19 1740  HGBA1C 12.0*   CBG: Recent Labs  Lab 03/12/19 1137 03/12/19 1711 03/12/19 2111 03/13/19 0554 03/13/19 1346  GLUCAP 107* 303* 287* 271* 212*   Lipid Profile: No results for input(s): CHOL, HDL, LDLCALC, TRIG, CHOLHDL, LDLDIRECT in the last 72 hours. Thyroid Function Tests: No results for input(s): TSH, T4TOTAL, FREET4, T3FREE, THYROIDAB in the last 72 hours. Anemia Panel: No results for input(s): VITAMINB12, FOLATE, FERRITIN, TIBC, IRON, RETICCTPCT in the last 72 hours. Urine analysis:      Component Value Date/Time   COLORURINE STRAW (A) 03/11/2019 1339   APPEARANCEUR CLEAR 03/11/2019 1339   LABSPEC 1.008 03/11/2019 1339   PHURINE 5.0 03/11/2019 1339   GLUCOSEU >=500 (A) 03/11/2019 1339   HGBUR LARGE (A) 03/11/2019 1339   BILIRUBINUR NEGATIVE 03/11/2019 1339   KETONESUR NEGATIVE 03/11/2019 1339   PROTEINUR 30 (A) 03/11/2019 1339   UROBILINOGEN 0.2 04/01/2014 1659   NITRITE NEGATIVE 03/11/2019 1339   LEUKOCYTESUR NEGATIVE 03/11/2019 1339   Sepsis Labs: @LABRCNTIP (procalcitonin:4,lacticidven:4)  ) Recent Results (from the past 240 hour(s))  Urine culture     Status: Abnormal (Preliminary result)   Collection Time: 03/11/19  1:39 PM   Specimen: Urine, Clean Catch  Result Value Ref Range Status   Specimen Description URINE, CLEAN CATCH  Final   Special Requests   Final    NONE Performed at [facility], [place] [address], [place], [place] [address]    Culture 60,000 COLONIES/mL KLEBSIELLA PNEUMONIAE (A)  Final   Report Status PENDING  Incomplete   Organism ID, Bacteria KLEBSIELLA PNEUMONIAE (  A)  Final      Susceptibility   Klebsiella pneumoniae - MIC*    AMPICILLIN >=32 RESISTANT Resistant     CEFAZOLIN <=4 SENSITIVE Sensitive     CEFTRIAXONE <=1 SENSITIVE Sensitive     CIPROFLOXACIN <=0.25 SENSITIVE Sensitive     GENTAMICIN <=1 SENSITIVE Sensitive     IMIPENEM <=0.25 SENSITIVE Sensitive     NITROFURANTOIN 128 RESISTANT Resistant     TRIMETH/SULFA <=20 SENSITIVE Sensitive     AMPICILLIN/SULBACTAM 4 SENSITIVE Sensitive     PIP/TAZO <=4 SENSITIVE Sensitive     Extended ESBL NEGATIVE Sensitive     * 60,000 COLONIES/mL KLEBSIELLA PNEUMONIAE  SARS CORONAVIRUS 2 (TAT 6-24 HRS)     Status: None   Collection Time: 03/11/19  1:39 PM  Result Value Ref Range Status   SARS Coronavirus 2 NEGATIVE NEGATIVE Final    Comment: (NOTE) SARS-CoV-2 target nucleic acids are NOT DETECTED. The SARS-CoV-2 RNA is generally detectable in upper and lower respiratory specimens  during the acute phase of infection. Negative results do not preclude SARS-CoV-2 infection, do not rule out co-infections with other pathogens, and should not be used as the sole basis for treatment or other patient management decisions. Negative results must be combined with clinical observations, patient history, and epidemiological information. The expected result is Negative. Fact Sheet for Patients: SugarRoll.be Fact Sheet for Healthcare Providers: https://www.woods-mathews.com/ This test is not yet approved or cleared by the [place] FDA and  has been authorized for detection and/or diagnosis of SARS-CoV-2 by FDA under an Emergency Use Authorization (EUA). This EUA will remain  in effect (meaning this test can be used) for the duration of the COVID-19 declaration under Section 56 4(b)(1) of the Act, 21 U.S.C. section 360bbb-3(b)(1), unless the authorization is terminated or revoked sooner. Performed at [facility], [place] [address], [place], [place] [address]          Radiology Studies: No results found.      Scheduled Meds:  carvedilol  12.5 mg Oral BID WC   ferrous sulfate  325 mg Oral BID WC   insulin aspart  0-15 Units Subcutaneous TID WC   insulin aspart  0-5 Units Subcutaneous QHS   insulin glargine  12 Units Subcutaneous Daily   levothyroxine  50 mcg Oral QAC breakfast   magnesium oxide  400 mg Oral q morning - 10a   pantoprazole (PROTONIX) IV  40 mg Intravenous Q12H   pregabalin  150 mg Oral BID   rosuvastatin  20 mg Oral q morning - 10a   sodium chloride flush  3 mL Intravenous Q12H   Continuous Infusions:    LOS: 1 day   Time spent: 53min  Dana Allan, MD [facility] via [email or web address], password [email or web address] After 7PM please contact night-coverage  03/13/2019, 2:50 PM

## 2019-03-14 DIAGNOSIS — D122 Benign neoplasm of ascending colon: Secondary | ICD-10-CM

## 2019-03-14 DIAGNOSIS — D125 Benign neoplasm of sigmoid colon: Secondary | ICD-10-CM

## 2019-03-14 DIAGNOSIS — D123 Benign neoplasm of transverse colon: Secondary | ICD-10-CM

## 2019-03-14 DIAGNOSIS — D124 Benign neoplasm of descending colon: Secondary | ICD-10-CM

## 2019-03-14 LAB — GLUCOSE, CAPILLARY
Glucose-Capillary: 186 mg/dL — ABNORMAL HIGH (ref 70–99)
Glucose-Capillary: 339 mg/dL — ABNORMAL HIGH (ref 70–99)
Glucose-Capillary: 223 mg/dL — ABNORMAL HIGH (ref 70–99)
Glucose-Capillary: 249 mg/dL — ABNORMAL HIGH (ref 70–99)

## 2019-03-14 LAB — CBC WITH DIFFERENTIAL/PLATELET
Abs Immature Granulocytes: 0.02 10*3/uL (ref 0.00–0.07)
Basophils Absolute: 0 10*3/uL (ref 0.0–0.1)
Basophils Relative: 0 %
Eosinophils Absolute: 0.2 10*3/uL (ref 0.0–0.5)
Eosinophils Relative: 4 %
HCT: 27.8 % — ABNORMAL LOW (ref 36.0–46.0)
Hemoglobin: 8.5 g/dL — ABNORMAL LOW (ref 12.0–15.0)
Immature Granulocytes: 0 %
Lymphocytes Relative: 14 %
Lymphs Abs: 0.7 10*3/uL (ref 0.7–4.0)
MCH: 25.9 pg — ABNORMAL LOW (ref 26.0–34.0)
MCHC: 30.6 g/dL (ref 30.0–36.0)
MCV: 84.8 fL (ref 80.0–100.0)
Monocytes Absolute: 0.5 10*3/uL (ref 0.1–1.0)
Monocytes Relative: 10 %
Neutro Abs: 3.8 10*3/uL (ref 1.7–7.7)
Neutrophils Relative %: 72 %
Platelets: 189 10*3/uL (ref 150–400)
RBC: 3.28 MIL/uL — ABNORMAL LOW (ref 3.87–5.11)
RDW: 15.9 % — ABNORMAL HIGH (ref 11.5–15.5)
WBC: 5.4 10*3/uL (ref 4.0–10.5)
nRBC: 0 % (ref 0.0–0.2)

## 2019-03-14 LAB — URINE CULTURE: Culture: 60000 — AB

## 2019-03-14 MED ORDER — MENTHOL 3 MG MT LOZG
1.0000 | LOZENGE | OROMUCOSAL | Status: DC | PRN
  Filled 2019-03-14: qty 9

## 2019-03-14 MED ORDER — PANTOPRAZOLE SODIUM 40 MG PO TBEC: DELAYED_RELEASE_TABLET | ORAL | 1 refills | Status: DC

## 2019-03-14 MED ORDER — INSULIN GLARGINE 100 UNIT/ML ~~LOC~~ SOLN: 14.0000 [IU] | Freq: Every day | SUBCUTANEOUS | Status: DC

## 2019-03-14 MED ORDER — CLOPIDOGREL BISULFATE 75 MG PO TABS: 75.0000 mg | ORAL_TABLET | Freq: Every day | ORAL | Status: DC

## 2019-03-14 MED ORDER — INSULIN ASPART 100 UNIT/ML ~~LOC~~ SOLN
4.0000 [IU] | Freq: Three times a day (TID) | SUBCUTANEOUS | Status: DC
  Administered 2019-03-14: 4 [IU] via SUBCUTANEOUS

## 2019-03-14 NOTE — Progress Notes (Signed)
PROGRESS NOTE    Susan [name]  GQQ:[id number] DOB: 04-18-1951 DOA: 03/11/2019 PCP: Alycia Rossetti, MD   Brief Narrative: Patient is a 68 year old Caucasian female, morbidly obese, with past medical history significant for HTN, HLD, diastolic CHF with EF 67-12% with grade 1 diastolic dysfunction, DM type II, chronic kidney disease, CAD, remote history of tobacco use, and CVA presented to the hospital with hematochezia.  On presentation to the hospital, patient's blood sugar was also noted to be uncontrolled.  Patient was admitted for further assessment and management.  Patient underwent EGD and colonoscopy.  EGD revealed "a single 15 mm pedunculated polyp with inflammation was found on the greater curvature of the gastric body. The polyp was removed with a hot snare. Resection and retrieval were complete. To stop active bleeding after polypectomy, four hemostatic clips were successfully placed".  Colonoscopy revealed "two sessile polyps were found in the ascending colon. The polyps were 4 to 5 mm in size. For hemostasis, one hemostatic clip was successfully placed to control minor post-polypectomy, persistent oozing. There was no bleeding at the end of the maneuver".    Assessment & Plan:   Melena, heme positive stools, on presentation  -Hemoglobin has been trended down from 11 range at baseline to 8.5 today, hemoglobin on 03/13/2019 at 9.9. Patient was on aspirin and Plavix at baseline for history of CAD, remote stents, both are on hold. Patient has undergone EGD and colonoscopy on 03/13/2019.  Continue to monitor H&H.  Continue Protonix IV twice daily.  Hyperkalemia:  -Resolved.  Latest potassium of 3.7.  Uncontrolled insulin-dependent diabetes mellitus: -CBGs was greater than 400 on admission. Last hemoglobin A1c was 8.3 from 10 months ago, now 12.0 despite insulin pump, unclear if this is dietary or whether her pump is not working well -Patient is followed by Dr. [name] with endocrinology  -Diabetes coordinator consulted, recommended to transition to Lantus and sliding scale since her pump medicines are about to expire. Continue to optimize blood sugar control.  Continue to monitor closely.  We will increase the Lantus to 15 units daily and add NovoLog 4 units 3 times daily with meals if eating adequately.  Acute kidney injury on chronic kidney disease stage III: -Creatinine on admission was 2.9, baseline is closer to 1.8.  Latest creatinine of 1.6  Chronic diastolic CHF: -Clinically appears euvolemic at this time, diuretics on hold, continue to monitor off diuretics   Hypothyroidism: -Continue levothyroxine  Essential hypertension: Blood pressure currently stable. -Continue Coreg  Yeast infection:  -Given Diflucan x1   Impaired ambulation secondary to right foot transmetatarsal amputation -PT on board.  Hyperlipidemia -Continue Crestor  DVT prophylaxis: SCD  Code Status: Full  Family Communication:  Spoke with the patient at bedside  Disposition Plan:  Home with home health likely in 1 to 2 days.  Continue physical therapy.  H&H in a.m. follow GI recommendations.  Consultants:   Gastroenterology Dr. Carol Ada   Procedures:  EGD colonoscopy on 03/13/2019  Antimicrobials:  None  Subjective: Patient complains of generalized weakness, fatigue and difficulty ambulation.  Denies any melena.  Objective: Vitals:   03/13/19 2017 03/14/19 0438 03/14/19 0841 03/14/19 1243  BP: (!) 157/68 (!) 148/63 138/60 (!) 131/57  Pulse: 85 90 79 79  Resp: 18 18  19   Temp: 98.5 F (36.9 C) 98.2 F (36.8 C)  98.5 F (36.9 C)  TempSrc: Oral Oral  Oral  SpO2: 97% 95% 93% 97%  Weight:  116.7 kg    Height:  Intake/Output Summary (Last 24 hours) at 03/14/2019 1354 Last data filed at 03/14/2019 1245 Gross per 24 hour  Intake 1030 ml  Output 1475 ml  Net -445 ml   Filed Weights   03/13/19 0655 03/13/19 1101 03/14/19 0438  Weight: 119.2 kg 119.2  kg 116.7 kg    Examination: General: Morbidly obese not in obvious distress HENT: Normocephalic, pupils equally reacting to light and accommodation.  No scleral pallor or icterus noted. Oral mucosa is moist.  Chest:  Clear breath sounds.  Diminished breath sounds bilaterally. No crackles or wheezes.  CVS: S1 &S2 heard. No murmur.  Regular rate and rhythm. Abdomen: Soft, nontender, nondistended.  Bowel sounds are heard.  Liver is not palpable, no abdominal mass palpated Extremities: Right foot transmetatarsal amputation. Psych: Alert, awake and oriented, normal mood CNS:  No cranial nerve deficits.  Power equal in all extremities.  No sensory deficits noted.  No cerebellar signs.   Skin: Warm and dry.  No rashes noted.    Data Reviewed:   CBC: Recent Labs  Lab 03/11/19 0624 03/11/19 1740 03/12/19 0523 03/13/19 0623 03/13/19 1518 03/14/19 0541  WBC 12.1*  --  5.7 5.4  --  5.4  NEUTROABS  --   --  3.9  --   --  3.8  HGB 10.8* 10.1* 9.7* 10.6* 9.9* 8.5*  HCT 35.3* 31.3* 30.3* 33.8* 31.1* 27.8*  MCV 84.9  --  82.6 83.7  --  84.8  PLT 231  --  187 187  --  301   Basic Metabolic Panel: Recent Labs  Lab 03/11/19 0624 03/11/19 1740 03/12/19 0523 03/13/19 0623  NA 134* 135 140 139  K 5.2* 4.1 3.7 4.0  CL 97* 99 104 103  CO2 24 25 26 23   GLUCOSE 413* 306* 85 261*  BUN 66* 68* 64* 50*  CREATININE 2.90* 2.14* 1.92* 1.61*  CALCIUM 8.6* 8.5* 8.6* 9.0   GFR: Estimated Creatinine Clearance: 42.1 mL/min (A) (by C-G formula based on SCr of 1.61 mg/dL (H)). Liver Function Tests: Recent Labs  Lab 03/11/19 0624  AST 17  ALT 17  ALKPHOS 125  BILITOT 0.3  PROT 5.7*  ALBUMIN 3.0*   No results for input(s): LIPASE, AMYLASE in the last 168 hours. No results for input(s): AMMONIA in the last 168 hours. Coagulation Profile: No results for input(s): INR, PROTIME in the last 168 hours. Cardiac Enzymes: No results for input(s): CKTOTAL, CKMB, CKMBINDEX, TROPONINI in the last 168  hours. BNP (last 3 results) No results for input(s): PROBNP in the last 8760 hours. HbA1C: Recent Labs    03/11/19 1740  HGBA1C 12.0*   CBG: Recent Labs  Lab 03/13/19 1346 03/13/19 1637 03/13/19 2138 03/14/19 0648 03/14/19 1108  GLUCAP 212* 256* 267* 339* 223*   Lipid Profile: No results for input(s): CHOL, HDL, LDLCALC, TRIG, CHOLHDL, LDLDIRECT in the last 72 hours. Thyroid Function Tests: No results for input(s): TSH, T4TOTAL, FREET4, T3FREE, THYROIDAB in the last 72 hours. Anemia Panel: No results for input(s): VITAMINB12, FOLATE, FERRITIN, TIBC, IRON, RETICCTPCT in the last 72 hours. Urine analysis:    Component Value Date/Time   COLORURINE STRAW (A) 03/11/2019 1339   APPEARANCEUR CLEAR 03/11/2019 1339   LABSPEC 1.008 03/11/2019 1339   PHURINE 5.0 03/11/2019 1339   GLUCOSEU >=500 (A) 03/11/2019 1339   HGBUR LARGE (A) 03/11/2019 1339   BILIRUBINUR NEGATIVE 03/11/2019 1339   KETONESUR NEGATIVE 03/11/2019 1339   PROTEINUR 30 (A) 03/11/2019 1339   UROBILINOGEN 0.2 04/01/2014 1659  NITRITE NEGATIVE 03/11/2019 1339   LEUKOCYTESUR NEGATIVE 03/11/2019 1339   Sepsis Labs: @LABRCNTIP (procalcitonin:4,lacticidven:4)  ) Recent Results (from the past 240 hour(s))  Urine culture     Status: Abnormal   Collection Time: 03/11/19  1:39 PM   Specimen: Urine, Clean Catch  Result Value Ref Range Status   Specimen Description URINE, CLEAN CATCH  Final   Special Requests   Final    NONE Performed at [facility], [place] [address] [place], [place] [address]    Culture (A)  Final    60,000 COLONIES/mL KLEBSIELLA PNEUMONIAE 30,000 COLONIES/mL ESCHERICHIA COLI    Report Status 03/14/2019 FINAL  Final   Organism ID, Bacteria KLEBSIELLA PNEUMONIAE (A)  Final   Organism ID, Bacteria ESCHERICHIA COLI (A)  Final      Susceptibility   Escherichia coli - MIC*    AMPICILLIN <=2 SENSITIVE Sensitive     CEFAZOLIN <=4 SENSITIVE Sensitive     CEFTRIAXONE <=1 SENSITIVE Sensitive      CIPROFLOXACIN <=0.25 SENSITIVE Sensitive     GENTAMICIN <=1 SENSITIVE Sensitive     IMIPENEM <=0.25 SENSITIVE Sensitive     NITROFURANTOIN <=16 SENSITIVE Sensitive     TRIMETH/SULFA <=20 SENSITIVE Sensitive     AMPICILLIN/SULBACTAM <=2 SENSITIVE Sensitive     PIP/TAZO <=4 SENSITIVE Sensitive     Extended ESBL NEGATIVE Sensitive     * 30,000 COLONIES/mL ESCHERICHIA COLI   Klebsiella pneumoniae - MIC*    AMPICILLIN >=32 RESISTANT Resistant     CEFAZOLIN <=4 SENSITIVE Sensitive     CEFTRIAXONE <=1 SENSITIVE Sensitive     CIPROFLOXACIN <=0.25 SENSITIVE Sensitive     GENTAMICIN <=1 SENSITIVE Sensitive     IMIPENEM <=0.25 SENSITIVE Sensitive     NITROFURANTOIN 128 RESISTANT Resistant     TRIMETH/SULFA <=20 SENSITIVE Sensitive     AMPICILLIN/SULBACTAM 4 SENSITIVE Sensitive     PIP/TAZO <=4 SENSITIVE Sensitive     Extended ESBL NEGATIVE Sensitive     * 60,000 COLONIES/mL KLEBSIELLA PNEUMONIAE  SARS CORONAVIRUS 2 (TAT 6-24 HRS)     Status: None   Collection Time: 03/11/19  1:39 PM  Result Value Ref Range Status   SARS Coronavirus 2 NEGATIVE NEGATIVE Final    Comment: (NOTE) SARS-CoV-2 target nucleic acids are NOT DETECTED. The SARS-CoV-2 RNA is generally detectable in upper and lower respiratory specimens during the acute phase of infection. Negative results do not preclude SARS-CoV-2 infection, do not rule out co-infections with other pathogens, and should not be used as the sole basis for treatment or other patient management decisions. Negative results must be combined with clinical observations, patient history, and epidemiological information. The expected result is Negative. Fact Sheet for Patients: SugarRoll.be Fact Sheet for Healthcare Providers: https://www.woods-mathews.com/ This test is not yet approved or cleared by the [place] FDA and  has been authorized for detection and/or diagnosis of SARS-CoV-2 by FDA under an  Emergency Use Authorization (EUA). This EUA will remain  in effect (meaning this test can be used) for the duration of the COVID-19 declaration under Section 56 4(b)(1) of the Act, 21 U.S.C. section 360bbb-3(b)(1), unless the authorization is terminated or revoked sooner. Performed at [facility], [place] [address] [place],  [address]       Radiology Studies: No results found.   Scheduled Meds: . carvedilol  12.5 mg Oral BID WC  . ferrous sulfate  325 mg Oral BID WC  . insulin aspart  0-15 Units Subcutaneous TID WC  . insulin  aspart  0-5 Units Subcutaneous QHS  . insulin glargine  12 Units Subcutaneous Daily  . levothyroxine  50 mcg Oral QAC breakfast  . magnesium oxide  400 mg Oral q morning - 10a  . pantoprazole (PROTONIX) IV  40 mg Intravenous Q12H  . pregabalin  150 mg Oral BID  . rosuvastatin  20 mg Oral q morning - 10a  . sodium chloride flush  3 mL Intravenous Q12H   Continuous Infusions:    LOS: 2 days    Oscar La, MD [facility] 03/14/2019, 1:54 PM

## 2019-03-14 NOTE — TOC Initial Note (Addendum)
Transition of Care [facility]) - Initial/Assessment Note    Patient Details  Name: Susan [name] MRN: [id number] Date of Birth: Mar 28, 1951  [facility]) CM/SW Contact:    Gildardo Griffes, LCSW Phone [phone] 03/14/2019, 10:11 AM  Clinical Narrative:                  CSW met with patient at bedside for discharge planning. Patient states she lives at home with her husband who provides intermittent support, as he works.  CSW informed patient of Home Health PT recommendations, patient is agreeable and reports preference for [facility] as she has used them in the past. Patient reports no equipment needs at this time as she has a walker and cane at home.   CSW has reached out to [place] with [facility] they are able to accept patient.   Expected Discharge Plan: Home w Home Health Services Barriers to Discharge: Continued Medical Work up   Patient Goals and CMS Choice Patient states their goals for this hospitalization and ongoing recovery are:: [facility] CMS Medicare.gov Compare Post Acute Care list provided to:: Patient Choice offered to / list presented to : Patient  Expected Discharge Plan and Services Expected Discharge Plan: Home w Home Health Services       Living arrangements for the past 2 months: Single Family Home                                      Prior Living Arrangements/Services Living arrangements for the past 2 months: Single Family Home Lives with:: Spouse Patient language and need for interpreter reviewed:: Yes Do you feel safe going back to the place where you live?: Yes      Need for Family Participation in Patient Care: Yes (Comment) Care giver support system in place?: Yes (comment)   Criminal Activity/Legal Involvement Pertinent to Current Situation/Hospitalization: No - Comment as needed  Activities of Daily Living Home Assistive Devices/Equipment: Walker (specify type), Cane  (specify quad or straight) ADL Screening (condition at time of admission) Patient's cognitive ability adequate to safely complete daily activities?: Yes Is the patient deaf or have difficulty hearing?: No Does the patient have difficulty seeing, even when wearing glasses/contacts?: No Does the patient have difficulty concentrating, remembering, or making decisions?: No Patient able to express need for assistance with ADLs?: Yes Does the patient have difficulty dressing or bathing?: No Independently performs ADLs?: Yes (appropriate for developmental age) Does the patient have difficulty walking or climbing stairs?: Yes Weakness of Legs: Both Weakness of Arms/Hands: None  Permission Sought/Granted Permission sought to share information with : Case Manager, Magazine features editor, Family Supports Permission granted to share information with : Yes, Verbal Permission Granted     Permission granted to share info w AGENCY: Home Health Agencies        Emotional Assessment Appearance:: Appears stated age Attitude/Demeanor/Rapport: Gracious Affect (typically observed): Calm Orientation: : Oriented to Self, Oriented to Place, Oriented to  Time, Oriented to Situation Alcohol / Substance Use: Not Applicable Psych Involvement: No (comment)  Admission diagnosis:  Rectal bleeding [K62.5] Patient Active Problem List   Diagnosis Date Noted  . Melena   . Benign neoplasm of ascending colon   . Benign neoplasm of transverse colon   . Benign neoplasm of descending colon   . Benign neoplasm of sigmoid colon   . Gastric  polyps   . GI bleed 03/11/2019  . Hyperkalemia 03/11/2019  . Prolonged QT interval 03/11/2019  . Acute blood loss anemia 03/11/2019  . Onychomycosis 06/21/2018  . Pressure injury of skin 03/19/2018  . Syncope 03/18/2018  . 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)   . Toe osteomyelitis, right (HCC)   . 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  (HCC)   . Acute on chronic diastolic (congestive) heart failure (HCC) 02/13/2018  . PVD (peripheral vascular disease) (HCC) 10/26/2017  . Hypothyroid 07/27/2017  . PAH (pulmonary artery hypertension) (HCC)   . Impaired ambulation 07/19/2017  . Hyperosmolar non-ketotic state in patient with type 2 diabetes mellitus (HCC) 07/15/2017  . Leg cramps 02/27/2017  . Peripheral edema 01/12/2017  . Diabetic neuropathy (HCC) 11/12/2016  . 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 mellitus (HCC) 10/24/2015  . Anemia 10/03/2015  . Generalized anxiety disorder 10/03/2015  . Insomnia 10/03/2015  . Chest pain   . Diabetes mellitus type II, uncontrolled (HCC) 06/07/2015  . Chronic diastolic CHF (congestive heart failure) (HCC) 06/07/2015  . Non compliance with medical treatment 04/17/2014  . Rotator cuff tear 03/14/2014  . Morbid obesity (HCC) 09/23/2013  . Acute kidney injury superimposed on chronic kidney disease (HCC) 12/25/2012  . Hypotension 12/25/2012  . Urinary incontinence   . MDD (major depressive disorder) 11/12/2010  . RBBB (right bundle branch block)   . CAD (coronary artery disease)   . Hyperlipemia 01/22/2009  . Essential hypertension 01/22/2009   PCP:  Salley Scarlet, MD Pharmacy:   [facility] [address], [place] - [address] [place] [address] Phone: [phone] Fax: [phone]     Social Determinants of Health (SDOH) Interventions    Readmission Risk Interventions No flowsheet data found.

## 2019-03-14 NOTE — Plan of Care (Signed)
  Problem: Elimination: Goal: Will not experience complications related to bowel motility Outcome: Completed/Met Goal: Will not experience complications related to urinary retention Outcome: Completed/Met   Problem: Skin Integrity: Goal: Risk for impaired skin integrity will decrease Outcome: Completed/Met

## 2019-03-14 NOTE — Consult Note (Signed)
[facility] Nurse wound consult note Reason for Consult: Consult requested for left leg.  Pt states she developed generalized edema to left leg "about 3 weeks ago" and she had 2 areas of blisters which evolved into wounds when they ruptured. Left foot with previous wound is healed with dry intact skin. No topical treatment is indicated Left calf with healing full thickness wound; 1X1X.1cm, red and moist, no odor, drainage, or fluctuance, small amt yellow drainage Dressing procedure/placement/frequency: Foam dressing to protect and promote healing. Please re-consult if further assistance is needed.  Thank-you,  Julien Girt MSN, [place], [place], [name], [place]

## 2019-03-14 NOTE — Progress Notes (Signed)
Inpatient Diabetes Program Recommendations  AACE/ADA: New Consensus Statement on Inpatient Glycemic Control (2015)  Target Ranges:  Prepandial:   less than 140 mg/dL      Peak postprandial:   less than 180 mg/dL (1-2 hours)      Critically ill patients:  140 - 180 mg/dL   Lab Results  Component Value Date   GLUCAP 339 (H) 03/14/2019   HGBA1C 12.0 (H) 03/11/2019    Review of Glycemic Control Results for ARDINE, IACOVELLI (MRN [id number]) as of 03/14/2019 10:29  Ref. Range 03/13/2019 13:46 03/13/2019 16:37 03/13/2019 21:38 03/14/2019 06:48  Glucose-Capillary Latest Ref Range: 70 - 99 mg/dL 212 (H) 256 (H) 267 (H) 339 (H)   AdmitGIB/ CP  History:DM2, CVA, CKD, CHF   HomeDM Meds: NPH Insulin 10 units AM if CBG 100-199 or 15 units AM if CBG >200  Regular Insulin with the Omni Pod Dash Insulin Pump (80-150 give 7 units, 151-200 give 9 units, 201-250 give 12 units, 251-300 give 14 units, 301-400 give 17 units)--Looks like she's using the Omni Pump for boluses only   Current Orders:Novolog Moderate Correction Scale/ SSI (0-15 units) TID AC + HS Lantus 12 units Daily  MD- Please consider the following in-hospital insulin adjustments:  1. Increase Lantus to 15 units Daily  2. If patient consuming PO diet adequately, Start Novolog Meal Coverage:  Novolog 4 units TID with meals  (Please add the following Hold Parameters: Hold if pt eats <50% of meal, Hold if pt NPO)  Thanks, Bronson Curb, MSN, RNC-OB Diabetes Coordinator [phone] (8a-5p)

## 2019-03-14 NOTE — Consult Note (Signed)
[facility] CM Inpatient Consult   03/14/2019  Susan [name] March 02, 1951 [id number]   Patient screened for active status with [employer].  Patient is being followed by a [facility] Pharmacist.  Plan: [facility] rosters reviewed and notified [facility] Pharmacist of patient is followed by [facility] for care management needs.    Will sign off when update received from [facility] Pharmacist.  For questions contact:   [name], RN BSN CCM [facility]  [phone] business mobile phone Toll free office [phone]  Fax number: [phone] [email or web address]

## 2019-03-14 NOTE — Progress Notes (Signed)
Subjective: Events since admission noted; patient seems to be doing fairly well today. She is to be discharged shortly. She has had some dry heaves earlier due to stress but denies having any abdominal pain, melena or hematochezia. She has not had a BM since her colonoscopy. She has been off her Plavix and Aspirin since admission.   Objective: Vital signs in last 24 hours: Temp:  [98.2 F (36.8 C)-98.5 F (36.9 C)] 98.5 F (36.9 C) (10/19 1243) Pulse Rate:  [79-90] 79 (10/19 1243) Resp:  [18-19] 19 (10/19 1243) BP: (131-157)/(57-68) 131/57 (10/19 1243) SpO2:  [93 %-97 %] 97 % (10/19 1243) Weight:  [116.7 kg] 116.7 kg (10/19 0438) Last BM Date: 03/13/19(bowel prep last night )  Intake/Output from previous day: 10/18 0701 - 10/19 0700 In: 1010 [P.O.:710; I.V.:300] Out: 1475 [Urine:1475] Intake/Output this shift: Total I/O In: 440 [P.O.:440] Out: -   General appearance: alert, cooperative, appears stated age, no distress, morbidly obese and pale Resp: clear to auscultation bilaterally Cardio: irregularly irregular rhythm GI: soft, morbidly obese non-tender; bowel sounds normal; no masses,  no organomegaly Extremities: extremities normal, atraumatic, no cyanosis or edema  Lab Results: Recent Labs    03/12/19 0523 03/13/19 0623 03/13/19 1518 03/14/19 0541  WBC 5.7 5.4  --  5.4  HGB 9.7* 10.6* 9.9* 8.5*  HCT 30.3* 33.8* 31.1* 27.8*  PLT 187 187  --  189   BMET Recent Labs    03/11/19 1740 03/12/19 0523 03/13/19 0623  NA 135 140 139  K 4.1 3.7 4.0  CL 99 104 103  CO2 25 26 23   GLUCOSE 306* 85 261*  BUN 68* 64* 50*  CREATININE 2.14* 1.92* 1.61*  CALCIUM 8.5* 8.6* 9.0   Medications: I have reviewed the patient's current medications.  Assessment/Plan: 1) ABLA-Large gastric polyp removed and clips placed; multiple colonic polyps removed and scattered diverticulosis noted; as per my discussion with Dr Louanne Belton, plans are to hold the Plavix for the next 5 days and allow  her to take her Aspirin 81 mg everyday. She will see me in follow up in 1 week. Avoidance of all NSAIDS has been advised for now.  2) AKI on CKD. 3) AODM/HTN. 4) Hypothyroidism/Morbid obesity.   LOS: 2 days   [name] 03/14/2019, 4:50 PM

## 2019-03-14 NOTE — Discharge Summary (Signed)
Physician Discharge Summary  Susan [name] ZOX:[id number] DOB: July 12, 1950 DOA: 03/11/2019  PCP: Salley Scarlet, MD  Admit date: 03/11/2019 Discharge date: 03/14/2019  Admitted From: Home  Discharge disposition: Home with Home health   Recommendations for Outpatient Follow-Up:    Follow up with your primary care provider in one week.    Discharge Diagnosis:   Principal Problem:   GI bleed Active Problems:   Essential hypertension   Acute kidney injury superimposed on chronic kidney disease (HCC)   Diabetes mellitus type II, uncontrolled (HCC)   Chest pain   Hypothyroid   Hyperkalemia   Prolonged QT interval   Acute blood loss anemia   Melena   Benign neoplasm of ascending colon   Benign neoplasm of transverse colon   Benign neoplasm of descending colon   Benign neoplasm of sigmoid colon   Gastric polyps   Discharge Condition: Improved.  Diet recommendation:  Carbohydrate-modified.  Wound care: None.  Code status: Full.   History of Present Illness:   Patient is a 68 year old Caucasian female, morbidly obese, with past medical history significant for HTN, HLD, diastolic CHF with EF 55-60% with grade 1 diastolic dysfunction, DM type II, chronic kidney disease, CAD,remote history of tobacco use,and CVA presented to the hospital with hematochezia.  On presentation to the hospital, patient's blood sugar was also noted to be uncontrolled.  Patient was admitted for further assessment and management.  Patient underwent EGD and colonoscopy.  EGD revealed"a single 15 mm pedunculated polyp with inflammation was found on the greater curvature of the gastric body. The polyp was removed with a hot snare. Resection and retrieval were complete. To stop active bleeding after polypectomy, four hemostatic clips were successfully placed".  Colonoscopy revealed "two sessile polyps were found in the ascending colon. The polyps were 4 to 5 mm in size. For hemostasis, one hemostatic  clip was successfully placed to control minor post-polypectomy, persistent oozing. There was no bleeding at the end of the maneuver".     Hospital Course:   Following conditions were addressed during hospitalization,  Melena, heme positive stools, on presentation  -Hemoglobin has been trended down from 11 range at baseline to 8.5 today, hemoglobin on 03/13/2019 at 9.9. Patient was on aspirin and Plavix at baseline for history of CAD, remote stents. Patient has undergone EGD and colonoscopy on 03/13/2019.  Continue to monitor H&H.  Continue Protonix twice daily on discharge.  GI recommends restarting Plavix at a later date will start on 03/18/2019.  Patient will need to follow-up with her primary care physician on discharge with repeat hemoglobin levels.  Hyperkalemia: -Resolved.  Latest potassium of 3.7.  Uncontrolled insulin-dependent diabetes mellitus: -CBGs was greater than 400 on admission. Last hemoglobin A1c was 8.3 from 10 months ago, now 12.0 despite insulin pump, unclear if this is dietary or whether her pump is not working well -Patient is followed by Dr. Talmage Nap with endocrinology.  Recommend follow-up with endocrinology as outpatient.  Acute kidney injury on chronic kidney disease stage III: -Creatinine on admission was 2.9, baseline is closer to 1.8.  Latest creatinine of 1.6.  Improved  Chronic diastolic CHF: -Clinically appears euvolemic at this time, diuretics on hold, continue to monitor off diuretics   Hypothyroidism: -Continue levothyroxine  Essential hypertension: Blood pressure currently stable. -Continue Coreg  Yeast infection:  -Given Diflucan x1   Impaired ambulation secondary to right foot transmetatarsal amputation -PT on board.  Hyperlipidemia -Continue Crestor  Disposition.  At this time, patient is stable for disposition  home with home health services.  Patient wishes to be discharged home at this time.   Medical Consultants:     None.   Subjective:   Today, patient feels fatigued and weak but wants to go home.  Denies any chest pain, dizziness, lightheadedness or shortness of breath.  Discharge Exam:   Vitals:   03/14/19 0841 03/14/19 1243  BP: 138/60 (!) 131/57  Pulse: 79 79  Resp:  19  Temp:  98.5 F (36.9 C)  SpO2: 93% 97%   Vitals:   03/13/19 2017 03/14/19 0438 03/14/19 0841 03/14/19 1243  BP: (!) 157/68 (!) 148/63 138/60 (!) 131/57  Pulse: 85 90 79 79  Resp: 18 18  19   Temp: 98.5 F (36.9 C) 98.2 F (36.8 C)  98.5 F (36.9 C)  TempSrc: Oral Oral  Oral  SpO2: 97% 95% 93% 97%  Weight:  116.7 kg    Height:       Examination: General: Morbidly obese not in obvious distress HENT: Normocephalic, pupils equally reacting to light and accommodation.  No scleral pallor or icterus noted. Oral mucosa is moist.  Chest:  Clear breath sounds.  Diminished breath sounds bilaterally. No crackles or wheezes.  CVS: S1 &S2 heard. No murmur.  Regular rate and rhythm. Abdomen: Soft, nontender, nondistended.  Bowel sounds are heard.  Liver is not palpable, no abdominal mass palpated Extremities: Right foot transmetatarsal amputation. Psych: Alert, awake and oriented, normal mood CNS:  No cranial nerve deficits.  Power equal in all extremities.  No sensory deficits noted.  No cerebellar signs.   Skin: Warm and dry.  No rashes noted.   Procedures:    EGD colonoscopy on 03/13/2019  The results of significant diagnostics from this hospitalization (including imaging, microbiology, ancillary and laboratory) are listed below for reference.     Diagnostic Studies:   Ct Abdomen Pelvis Wo Contrast  Result Date: 03/11/2019 CLINICAL DATA:  Abdominal pain, dark black watery stools with nausea. History of recent antibiotic administration. EXAM: CT ABDOMEN AND PELVIS WITHOUT CONTRAST TECHNIQUE: Multidetector CT imaging of the abdomen and pelvis was performed following the standard protocol without IV contrast.  COMPARISON:  No CT comparison is available. Most recent abdominal comparison is renal sonogram from October of 2019. FINDINGS: Lower chest: Signs of coronary artery disease and prior percutaneous coronary intervention. No signs of pericardial effusion. No basilar consolidation or pleural effusion. Heart is incompletely imaged. Hepatobiliary: Non-contrast appearance of the liver is unremarkable. Dense gallstones are present in the proximal gallbladder. No signs of pericholecystic fluid or stranding. Pancreas: Unremarkable. No pancreatic ductal dilatation or surrounding inflammatory changes. A Spleen: Normal in size without focal abnormality. Adrenals/Urinary Tract: Normal appearance of bilateral adrenal glands. Mild perinephric stranding bilaterally. No signs of hydronephrosis. No visible nephro or ureterolithiasis. Mild renal cortical scarring with relative symmetry. Stomach/Bowel: Pancolonic diverticulosis. No signs of acute diverticulitis. Normal appendix. No signs of bowel obstruction. Vascular/Lymphatic: Atherosclerotic changes of the abdominal aorta. No signs of aneurysmal dilation. No signs of upper abdominal or retroperitoneal lymphadenopathy. No signs of pelvic adenopathy. Reproductive: Post hysterectomy. Other: No signs of free air. No focal fluid collection. No ascites. Musculoskeletal: No acute bone finding or destructive bone process. Spinal degenerative changes. IMPRESSION: 1. No cause for the patient's symptoms identified. 2. Cholelithiasis. Pancolonic diverticulosis. 3. Atherosclerosis in the abdominal aorta. Aortic Atherosclerosis (ICD10-I70.0). Electronically Signed   By: Donzetta Kohut M.D.   On: 03/11/2019 12:48   Dg Chest 2 View  Result Date: 03/11/2019 CLINICAL DATA:  Left-sided  chest pain. Abdominal pain, nausea, and diarrhea. EXAM: CHEST - 2 VIEW COMPARISON:  03/18/2018 FINDINGS: The heart size and mediastinal contours are within normal limits. Aortic atherosclerosis. Both lungs are  clear. The visualized skeletal structures are unremarkable. IMPRESSION: No active cardiopulmonary disease. Electronically Signed   By: Danae Orleans M.D.   On: 03/11/2019 07:11     Labs:   Basic Metabolic Panel: Recent Labs  Lab 03/11/19 0624 03/11/19 1740 03/12/19 0523 03/13/19 0623  NA 134* 135 140 139  K 5.2* 4.1 3.7 4.0  CL 97* 99 104 103  CO2 24 25 26 23   GLUCOSE 413* 306* 85 261*  BUN 66* 68* 64* 50*  CREATININE 2.90* 2.14* 1.92* 1.61*  CALCIUM 8.6* 8.5* 8.6* 9.0   GFR Estimated Creatinine Clearance: 42.1 mL/min (A) (by C-G formula based on SCr of 1.61 mg/dL (H)). Liver Function Tests: Recent Labs  Lab 03/11/19 0624  AST 17  ALT 17  ALKPHOS 125  BILITOT 0.3  PROT 5.7*  ALBUMIN 3.0*   No results for input(s): LIPASE, AMYLASE in the last 168 hours. No results for input(s): AMMONIA in the last 168 hours. Coagulation profile No results for input(s): INR, PROTIME in the last 168 hours.  CBC: Recent Labs  Lab 03/11/19 0624 03/11/19 1740 03/12/19 0523 03/13/19 0623 03/13/19 1518 03/14/19 0541  WBC 12.1*  --  5.7 5.4  --  5.4  NEUTROABS  --   --  3.9  --   --  3.8  HGB 10.8* 10.1* 9.7* 10.6* 9.9* 8.5*  HCT 35.3* 31.3* 30.3* 33.8* 31.1* 27.8*  MCV 84.9  --  82.6 83.7  --  84.8  PLT 231  --  187 187  --  189   Cardiac Enzymes: No results for input(s): CKTOTAL, CKMB, CKMBINDEX, TROPONINI in the last 168 hours. BNP: Invalid input(s): POCBNP CBG: Recent Labs  Lab 03/13/19 1346 03/13/19 1637 03/13/19 2138 03/14/19 0648 03/14/19 1108  GLUCAP 212* 256* 267* 339* 223*   D-Dimer No results for input(s): DDIMER in the last 72 hours. Hgb A1c Recent Labs    03/11/19 1740  HGBA1C 12.0*   Lipid Profile No results for input(s): CHOL, HDL, LDLCALC, TRIG, CHOLHDL, LDLDIRECT in the last 72 hours. Thyroid function studies No results for input(s): TSH, T4TOTAL, T3FREE, THYROIDAB in the last 72 hours.  Invalid input(s): FREET3 Anemia work up No results for  input(s): VITAMINB12, FOLATE, FERRITIN, TIBC, IRON, RETICCTPCT in the last 72 hours. Microbiology Recent Results (from the past 240 hour(s))  Urine culture     Status: Abnormal   Collection Time: 03/11/19  1:39 PM   Specimen: Urine, Clean Catch  Result Value Ref Range Status   Specimen Description URINE, CLEAN CATCH  Final   Special Requests   Final    NONE Performed at [facility], [address], [place], [place] [address]    Culture (A)  Final    60,000 COLONIES/mL KLEBSIELLA PNEUMONIAE 30,000 COLONIES/mL ESCHERICHIA COLI    Report Status 03/14/2019 FINAL  Final   Organism ID, Bacteria KLEBSIELLA PNEUMONIAE (A)  Final   Organism ID, Bacteria ESCHERICHIA COLI (A)  Final      Susceptibility   Escherichia coli - MIC*    AMPICILLIN <=2 SENSITIVE Sensitive     CEFAZOLIN <=4 SENSITIVE Sensitive     CEFTRIAXONE <=1 SENSITIVE Sensitive     CIPROFLOXACIN <=0.25 SENSITIVE Sensitive     GENTAMICIN <=1 SENSITIVE Sensitive     IMIPENEM <=0.25 SENSITIVE Sensitive  NITROFURANTOIN <=16 SENSITIVE Sensitive     TRIMETH/SULFA <=20 SENSITIVE Sensitive     AMPICILLIN/SULBACTAM <=2 SENSITIVE Sensitive     PIP/TAZO <=4 SENSITIVE Sensitive     Extended ESBL NEGATIVE Sensitive     * 30,000 COLONIES/mL ESCHERICHIA COLI   Klebsiella pneumoniae - MIC*    AMPICILLIN >=32 RESISTANT Resistant     CEFAZOLIN <=4 SENSITIVE Sensitive     CEFTRIAXONE <=1 SENSITIVE Sensitive     CIPROFLOXACIN <=0.25 SENSITIVE Sensitive     GENTAMICIN <=1 SENSITIVE Sensitive     IMIPENEM <=0.25 SENSITIVE Sensitive     NITROFURANTOIN 128 RESISTANT Resistant     TRIMETH/SULFA <=20 SENSITIVE Sensitive     AMPICILLIN/SULBACTAM 4 SENSITIVE Sensitive     PIP/TAZO <=4 SENSITIVE Sensitive     Extended ESBL NEGATIVE Sensitive     * 60,000 COLONIES/mL KLEBSIELLA PNEUMONIAE  SARS CORONAVIRUS 2 (TAT 6-24 HRS)     Status: None   Collection Time: 03/11/19  1:39 PM  Result Value Ref Range Status   SARS Coronavirus 2  NEGATIVE NEGATIVE Final    Comment: (NOTE) SARS-CoV-2 target nucleic acids are NOT DETECTED. The SARS-CoV-2 RNA is generally detectable in upper and lower respiratory specimens during the acute phase of infection. Negative results do not preclude SARS-CoV-2 infection, do not rule out co-infections with other pathogens, and should not be used as the sole basis for treatment or other patient management decisions. Negative results must be combined with clinical observations, patient history, and epidemiological information. The expected result is Negative. Fact Sheet for Patients: HairSlick.no Fact Sheet for Healthcare Providers: quierodirigir.com This test is not yet approved or cleared by the [place] FDA and  has been authorized for detection and/or diagnosis of SARS-CoV-2 by FDA under an Emergency Use Authorization (EUA). This EUA will remain  in effect (meaning this test can be used) for the duration of the COVID-19 declaration under Section 56 4(b)(1) of the Act, 21 U.S.C. section 360bbb-3(b)(1), unless the authorization is terminated or revoked sooner. Performed at [facility], [address], [place], [place] [address]      Discharge Instructions:   Discharge Instructions    Call MD for:   Complete by: As directed    Ongoing bleeding.   Diet - low sodium heart healthy   Complete by: As directed    Discharge instructions   Complete by: As directed    Please follow-up with your primary care physician in 1 week.  Check blood work at that time.  Continue to take Protonix twice a day for next 4 weeks then once a day.  Start Plavix from 03/18/2019   Increase activity slowly   Complete by: As directed      Allergies as of 03/14/2019      Reactions   Codeine Nausea And Vomiting      Medication List    STOP taking these medications   cephALEXin 500 MG capsule Commonly known as: KEFLEX     TAKE these  medications   acetaminophen 500 MG tablet Commonly known as: TYLENOL Take 1,000-1,500 mg by mouth every 6 (six) hours as needed for headache (pain).   allopurinol 100 MG tablet Commonly known as: Zyloprim Take 1 tablet (100 mg total) by mouth 2 (two) times daily.   aspirin EC 81 MG tablet Take 81 mg by mouth every morning.   carvedilol 12.5 MG tablet Commonly known as: COREG TAKE 1 TABLET BY MOUTH TWICE A DAY WITH MEALS   cholecalciferol 1000 units  tablet Commonly known as: VITAMIN D Take 1,000 Units by mouth daily with supper.   clopidogrel 75 MG tablet Commonly known as: PLAVIX Take 1 tablet (75 mg total) by mouth daily. Start taking on: March 18, 2019 What changed:   when to take this  These instructions start on March 18, 2019. If you are unsure what to do until then, ask your doctor or other care provider.   colchicine 0.6 MG tablet Take 1 tablet (0.6 mg total) by mouth daily. As needed for gout flare What changed:   when to take this  reasons to take this  additional instructions   cyclobenzaprine 5 MG tablet Commonly known as: FLEXERIL Take 1 tablet (5 mg total) by mouth at bedtime.   escitalopram 20 MG tablet Commonly known as: LEXAPRO TAKE 1 TABLET BY MOUTH EVERY DAY What changed: when to take this   ferrous sulfate 325 (65 FE) MG tablet Take 1 tablet (325 mg total) by mouth 2 (two) times daily with a meal. Reported on 08/21/2015   insulin NPH Human 100 UNIT/ML injection Commonly known as: NOVOLIN N Inject 15 Units into the skin See admin instructions. Inject 15 units subcutaneously in the morning if CBG >300, inject 10 units if AM CBG 200-300   levothyroxine 50 MCG tablet Commonly known as: SYNTHROID Take 1 tablet (50 mcg total) by mouth daily before breakfast.   lidocaine 5 % Commonly known as: Lidoderm Place 1 patch onto the skin daily. Remove & Discard patch within 12 hours or as directed by MD   magnesium oxide 400 MG tablet Commonly  known as: MAG-OX TAKE 1 TABLET BY MOUTH EVERY DAY What changed: when to take this   methocarbamol 500 MG tablet Commonly known as: ROBAXIN Take 1 tablet (500 mg total) by mouth at bedtime.   metolazone 2.5 MG tablet Commonly known as: ZAROXOLYN Take 1 tablet (2.5 mg total) by mouth as directed. Monday and Friday What changed: when to take this   nitroGLYCERIN 0.4 MG SL tablet Commonly known as: NITROSTAT PLACE 1 TABLET (0.4 MG TOTAL) UNDER THE TONGUE EVERY 5 (FIVE) MINUTES AS NEEDED FOR CHEST PAIN.   nystatin cream Commonly known as: MYCOSTATIN Apply 1 application topically 2 (two) times daily.   OmniPod Dash 5 Pack Pods Misc Inject into the skin See admin instructions. Use continuously with Novolin R - change every 72 hours. Manually add bolus to continuous dose via OmniPod 3 times daily per sliding scale: CBG 80-150 7 units, 151-200 9 units, 201-250 12 units, 251-300 14 units, 301-400 17 units.   OneTouch Verio test strip Generic drug: glucose blood   pantoprazole 40 MG tablet Commonly known as: Protonix 1 tablet p.o. twice daily for 4 weeks then 1 tablet once daily   pregabalin 150 MG capsule Commonly known as: Lyrica Take 1 capsule (150 mg total) by mouth 2 (two) times daily.   ProAir HFA 108 (90 Base) MCG/ACT inhaler Generic drug: albuterol TAKE 2 PUFFS BY MOUTH EVERY 6 HOURS AS NEEDED FOR WHEEZE OR SHORTNESS OF BREATH What changed: See the new instructions.   rosuvastatin 20 MG tablet Commonly known as: CRESTOR TAKE 1 TABLET BY MOUTH EVERY DAY What changed: when to take this   tiZANidine 4 MG tablet Commonly known as: Zanaflex Take 1 tablet (4 mg total) by mouth every 6 (six) hours as needed for muscle spasms.   torsemide 20 MG tablet Commonly known as: DEMADEX Take 80 mg by mouth See admin instructions. Take 4 tablets (80  mg) by mouth twice daily - every morning and afternoon (between 2p and 3p)   traZODone 100 MG tablet Commonly known as: DESYREL Take 1  tablet (100 mg total) by mouth at bedtime as needed for sleep.   VITAMIN C PO Take 1 tablet by mouth daily with supper.   vitamin E 1000 UNIT capsule Generic drug: vitamin E Take 1,000 Units by mouth daily with supper.      Follow-up Information    [facility]-Home Follow up.   Specialty: Home Health Services Why: [facility] will be reaching out to schedule days/times for home health PT.            Time coordinating discharge: 39 minutes  Signed:  Estes Lehner  [facility] 03/14/2019, 4:20 PM

## 2019-03-15 ENCOUNTER — Ambulatory Visit: Payer: PPO | Admitting: Orthopedic Surgery

## 2019-03-15 ENCOUNTER — Telehealth: Payer: Self-pay | Admitting: Pharmacist

## 2019-03-15 ENCOUNTER — Telehealth: Payer: Self-pay

## 2019-03-15 DIAGNOSIS — E1365 Other specified diabetes mellitus with hyperglycemia: Secondary | ICD-10-CM

## 2019-03-15 DIAGNOSIS — R2681 Unsteadiness on feet: Secondary | ICD-10-CM

## 2019-03-15 DIAGNOSIS — R531 Weakness: Secondary | ICD-10-CM

## 2019-03-15 DIAGNOSIS — I509 Heart failure, unspecified: Secondary | ICD-10-CM

## 2019-03-15 DIAGNOSIS — IMO0002 Reserved for concepts with insufficient information to code with codable children: Secondary | ICD-10-CM

## 2019-03-15 LAB — TYPE AND SCREEN
ABO/RH(D): A POS
Antibody Screen: POSITIVE
Donor AG Type: NEGATIVE
Donor AG Type: NEGATIVE
Unit division: 0
Unit division: 0

## 2019-03-15 LAB — SURGICAL PATHOLOGY

## 2019-03-15 LAB — BPAM RBC
Blood Product Expiration Date: 202010282359
Blood Product Expiration Date: 202010282359
Unit Type and Rh: 5100
Unit Type and Rh: 5100

## 2019-03-15 NOTE — Telephone Encounter (Signed)
Hospital nurse called to report pt just got out of hospital and is needing an order to [facility] for PT. The fax number is [phone]. Please advise.

## 2019-03-15 NOTE — Telephone Encounter (Signed)
Okay to send orders Dx Gait instability, generalized weakness   - CHF, uncontrolled DM

## 2019-03-15 NOTE — Telephone Encounter (Signed)
Per discharge summary by Flora Lipps, MDfrom 03/14/2019, HH PT was to be set up by hospital.   Call placed to [facility] to inquire. States that no orders have been obtained.   Ok to send new orders for [facility] and PT? Of note, PT cannot be ordered singularly.

## 2019-03-15 NOTE — Telephone Encounter (Signed)
Referral orders placed

## 2019-03-15 NOTE — Patient Outreach (Signed)
[facility]) Care Management  03/15/2019  Susan [name] [birth year] [id number]  Patient was called for follow up as well as to complete a post discharge medication review. According the the [facility] In patient Nurse:  Patient discharged home yesterday with home health ([facility]).  Admitted for GI bleeding, had undergone EGD and colonoscopy on 03/13/2019. Continue to monitor H&H. Continue Protonix twice daily on discharge. GI recommends restarting Plavix at a later date- will start on 03/18/2019.   Unfortunately, patient did not answer her phone. HIPAA compliant message was left on both numbers.  Plan: Call patient back in 5-7 business days.  [name], PharmD, [place] Clinical Pharmacist [phone]

## 2019-03-15 NOTE — Telephone Encounter (Signed)
Please figure out what is needed for patient , okay to give verbal orders

## 2019-03-15 NOTE — TOC Progression Note (Signed)
Transition of Care [facility]) - Progression Note    Patient Details  Name: Susan [name] MRN: [id number] Date of Birth: Mar 18, 1951  Transition [facility]) CM/SW [place], [place] Phone Number: 03/15/2019, 9:55 AM  Clinical Narrative:     CSW was informed by [place] that patient was discharged 10/19, dc summary states home with home health and CSW had set up with [facility] however MD did not put in [place] PT orders prior to discharge.   CSW has informed MD that [place] Orders need to be input prior to discharge. CSW has reached out to Dr. [name], patient's PCP to request Home Health PT orders be input and faxed to [facility] at [phone].   Expected Discharge Plan: [place] Barriers to Discharge: Continued Medical Work up  Expected Discharge Plan and Services Expected Discharge Plan: [place] arrangements for the past 2 months: Single Family Home Expected Discharge Date: 03/14/19                                     Social Determinants of Health (SDOH) Interventions    Readmission Risk Interventions No flowsheet data found.

## 2019-03-16 ENCOUNTER — Encounter: Payer: Self-pay | Admitting: Family Medicine

## 2019-03-16 ENCOUNTER — Other Ambulatory Visit: Payer: Self-pay

## 2019-03-16 ENCOUNTER — Ambulatory Visit (INDEPENDENT_AMBULATORY_CARE_PROVIDER_SITE_OTHER): Payer: PPO | Admitting: Family Medicine

## 2019-03-16 VITALS — BP 134/70 | HR 74 | Temp 98.5°F | Resp 16 | Ht 66.0 in | Wt 266.0 lb

## 2019-03-16 DIAGNOSIS — I5032 Chronic diastolic (congestive) heart failure: Secondary | ICD-10-CM | POA: Diagnosis not present

## 2019-03-16 DIAGNOSIS — K2901 Acute gastritis with bleeding: Secondary | ICD-10-CM

## 2019-03-16 DIAGNOSIS — I83019 Varicose veins of right lower extremity with ulcer of unspecified site: Secondary | ICD-10-CM | POA: Diagnosis not present

## 2019-03-16 DIAGNOSIS — B3731 Acute candidiasis of vulva and vagina: Secondary | ICD-10-CM

## 2019-03-16 DIAGNOSIS — E1165 Type 2 diabetes mellitus with hyperglycemia: Secondary | ICD-10-CM | POA: Diagnosis not present

## 2019-03-16 DIAGNOSIS — B373 Candidiasis of vulva and vagina: Secondary | ICD-10-CM | POA: Diagnosis not present

## 2019-03-16 DIAGNOSIS — D62 Acute posthemorrhagic anemia: Secondary | ICD-10-CM | POA: Diagnosis not present

## 2019-03-16 DIAGNOSIS — I83029 Varicose veins of left lower extremity with ulcer of unspecified site: Secondary | ICD-10-CM | POA: Diagnosis not present

## 2019-03-16 DIAGNOSIS — I739 Peripheral vascular disease, unspecified: Secondary | ICD-10-CM

## 2019-03-16 DIAGNOSIS — E1122 Type 2 diabetes mellitus with diabetic chronic kidney disease: Secondary | ICD-10-CM | POA: Diagnosis not present

## 2019-03-16 DIAGNOSIS — L97929 Non-pressure chronic ulcer of unspecified part of left lower leg with unspecified severity: Secondary | ICD-10-CM | POA: Diagnosis not present

## 2019-03-16 DIAGNOSIS — L97919 Non-pressure chronic ulcer of unspecified part of right lower leg with unspecified severity: Secondary | ICD-10-CM

## 2019-03-16 DIAGNOSIS — N184 Chronic kidney disease, stage 4 (severe): Secondary | ICD-10-CM

## 2019-03-16 MED ORDER — FLUCONAZOLE 100 MG PO TABS: ORAL_TABLET | ORAL | 0 refills | Status: DC

## 2019-03-16 MED ORDER — NYSTATIN 100000 UNIT/GM EX CREA: 1.0000 "application " | TOPICAL_CREAM | Freq: Two times a day (BID) | CUTANEOUS | 2 refills | Status: DC

## 2019-03-16 NOTE — Progress Notes (Signed)
Subjective:    Patient ID: Susan [name], female    DOB: 10/20/1950, 68 y.o.   MRN: [id number]  Patient presents for Hospital F/U (GI Bleed), Yeast Infections (some irritation to vaginal area, some buring with urination due to open scratches to vaginal area, inteense itching), and Wound to LLE (open area to LLE from weeping edema- has resumed fluids pills since comong home from hospital)    Pt here for hospital follow up, she was admited secondary to GI bleed. She has f/u with Dr. Collene Mares tomorrow.   she is off the plavix for now  , she is on baby asa 81mg   Was found to have polyps in her colon as well gastric region along with gastritis.  She is supposed to be on Protonix twice a day for the next 4 weeks.    She had had an ulcer on top of her left foot for the past 3 weeks as well as a new one on her left shin and it has been getting bigger over the past week or so.  She missed her appointment with Dr. Sharol Given yesterday as she was discharged from the hospital.  In the previous note the one on her foot was noted but not the one on her shin.    Acute on chronic kideny damage Cr was  2.9  On admission 1.6   CHF- restarted duiretics yesterday      A1C is  12%- she is following with endocriology    She has been seen by nutritinist has f/u on thte 17th November.  States that she had to get a new Omni pod which she does not know how to set up somebody supposed be contacting her regarding this.   Has had vaginal irritation and discharge since she completed her antibiotics for urinary tract infection given by her urologist.  Per hospital note she was given Diflucan x1 this did not help.   Review Of Systems:  GEN- denies fatigue, fever, weight loss,weakness, recent illness HEENT- denies eye drainage, change in vision, nasal discharge, CVS- denies chest pain, palpitations RESP- denies SOB, cough, wheeze ABD- denies N/V, change in stools, abd pain GU- denies dysuria, hematuria, dribbling,  incontinence MSK-+ joint pain, muscle aches, injury Neuro- denies headache, dizziness, syncope, seizure activity       Objective:    BP 134/70   Pulse 74   Temp 98.5 F (36.9 C) (Oral)   Resp 16   Ht 5\' 6"  (1.676 m)   Wt 266 lb (120.7 kg)   SpO2 96%   BMI 42.93 kg/m  GEN- NAD, alert and oriented x3 HEENT- PERRL, EOMI, non injected sclera, pink conjunctiva, MMM, oropharynx clear CVS- RRR, no murmur RESP-CTAB ABD-NABS,soft,NT,ND EXT- chronic venous stasis changes, partial right foot amputation, left foot closed superficial ulcer 1/2 inch, mild shin 1.5 " ulcer , mild drainage, minimal erythema surrounding, NT  Pulses- Radial, DP-diminished         Assessment & Plan:      Problem List Items Addressed This Visit      Unprioritized   Acute blood loss anemia   Relevant Orders   CBC with Differential/Platelet   Chronic diastolic CHF (congestive heart failure) (HCC) (Chronic)    She is now back on her diuretics.  Does not appear to be fluid overloaded      Relevant Orders   CBC with Differential/Platelet   Basic metabolic panel   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 mellitus ([place])   Relevant  Orders   Basic metabolic panel   Diabetes mellitus type II, uncontrolled ([place])   Gastritis    Gastritis along with polyp which may have been bleeding.  Follow-up with GI tomorrow.  Pathology is still pending.  She is to take the PPI twice a day.  I will recheck her hemoglobin today as well as her renal function.      PVD (peripheral vascular disease) ([place])   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 (HCC) - Primary    Recurrent venous ulcers.  As scheduled appointment with Dr. Aundria Mems on Tuesday.  In the meantime because of the swelling associated we will put an Unna boot on her left lower extremity.  We will have the home health nurse remove this this weekend I do not want to keep it on too long the setting of her vascular disease.  We also wrapped this loosely.        Other Visit Diagnoses    Vaginal yeast infection       Treat with Diflucan 100 mg daily x3 days and then topical nystatin cream.  She is uncontrolled diabetic and recent antibiotics.   Relevant Medications   fluconazole (DIFLUCAN) 100 MG tablet   nystatin cream (MYCOSTATIN)      Note: This dictation was prepared with Dragon dictation along with smaller phrase technology. Any transcriptional errors that result from this process are unintentional.

## 2019-03-16 NOTE — Assessment & Plan Note (Signed)
Gastritis along with polyp which may have been bleeding.  Follow-up with GI tomorrow.  Pathology is still pending.  She is to take the PPI twice a day.  I will recheck her hemoglobin today as well as her renal function.

## 2019-03-16 NOTE — Assessment & Plan Note (Signed)
>>  ASSESSMENT AND PLAN FOR CHRONIC DIASTOLIC CHF (CONGESTIVE HEART FAILURE) (HCC) WRITTEN ON 03/16/2019  1:39 PM BY Grubbs, Kingsley Spittle F  She is now back on her diuretics.  Does not appear to be fluid overloaded

## 2019-03-16 NOTE — Assessment & Plan Note (Signed)
Recurrent venous ulcers.  As scheduled appointment with Dr. Aundria Mems on Tuesday.  In the meantime because of the swelling associated we will put an Unna boot on her left lower extremity.  We will have the home health nurse remove this this weekend I do not want to keep it on too long the setting of her vascular disease.  We also wrapped this loosely.

## 2019-03-16 NOTE — Patient Instructions (Addendum)
Continue protonix twice a day for 4 weeks Keep holding the plavix  Use the miralax 1 cap full daily  Take the dilfucan once a day for 3 days,  Use topical yeast cream as needed We will call with lab results Dr. Sharol Given Next [place] Oct 27th at  4pm F/U as previous

## 2019-03-16 NOTE — Assessment & Plan Note (Signed)
She is now back on her diuretics.  Does not appear to be fluid overloaded

## 2019-03-17 ENCOUNTER — Encounter: Payer: Self-pay | Admitting: Internal Medicine

## 2019-03-17 DIAGNOSIS — Z86010 Personal history of colonic polyps: Secondary | ICD-10-CM | POA: Diagnosis not present

## 2019-03-17 DIAGNOSIS — D509 Iron deficiency anemia, unspecified: Secondary | ICD-10-CM | POA: Diagnosis not present

## 2019-03-17 DIAGNOSIS — K5904 Chronic idiopathic constipation: Secondary | ICD-10-CM | POA: Diagnosis not present

## 2019-03-17 LAB — BASIC METABOLIC PANEL
BUN/Creatinine Ratio: 17 (calc) (ref 6–22)
BUN: 36 mg/dL — ABNORMAL HIGH (ref 7–25)
CO2: 23 mmol/L (ref 20–32)
Calcium: 8.7 mg/dL (ref 8.6–10.4)
Chloride: 103 mmol/L (ref 98–110)
Creat: 2.12 mg/dL — ABNORMAL HIGH (ref 0.50–0.99)
Glucose, Bld: 476 mg/dL — ABNORMAL HIGH (ref 65–99)
Potassium: 4.5 mmol/L (ref 3.5–5.3)
Sodium: 136 mmol/L (ref 135–146)

## 2019-03-17 LAB — CBC WITH DIFFERENTIAL/PLATELET
Absolute Monocytes: 400 cells/uL (ref 200–950)
Basophils Absolute: 40 cells/uL (ref 0–200)
Basophils Relative: 0.5 %
Eosinophils Absolute: 312 cells/uL (ref 15–500)
Eosinophils Relative: 3.9 %
HCT: 31.5 % — ABNORMAL LOW (ref 35.0–45.0)
Hemoglobin: 9.7 g/dL — ABNORMAL LOW (ref 11.7–15.5)
Lymphs Abs: 664 cells/uL — ABNORMAL LOW (ref 850–3900)
MCH: 26.2 pg — ABNORMAL LOW (ref 27.0–33.0)
MCHC: 30.8 g/dL — ABNORMAL LOW (ref 32.0–36.0)
MCV: 85.1 fL (ref 80.0–100.0)
MPV: 12.7 fL — ABNORMAL HIGH (ref 7.5–12.5)
Monocytes Relative: 5 %
Neutro Abs: 6584 cells/uL (ref 1500–7800)
Neutrophils Relative %: 82.3 %
Platelets: 208 10*3/uL (ref 140–400)
RBC: 3.7 10*6/uL — ABNORMAL LOW (ref 3.80–5.10)
RDW: 14.7 % (ref 11.0–15.0)
Total Lymphocyte: 8.3 %
WBC: 8 10*3/uL (ref 3.8–10.8)

## 2019-03-18 ENCOUNTER — Telehealth: Payer: Self-pay | Admitting: *Deleted

## 2019-03-18 ENCOUNTER — Other Ambulatory Visit: Payer: Self-pay | Admitting: *Deleted

## 2019-03-18 DIAGNOSIS — D62 Acute posthemorrhagic anemia: Secondary | ICD-10-CM

## 2019-03-18 NOTE — Telephone Encounter (Signed)
Received call from patient.   States that her appointment with Dr Sharol Given has been moved to 03/29/2019. Inquired as to if [facility] should still be removed over the weekend.   MD please advise.

## 2019-03-18 NOTE — Telephone Encounter (Signed)
Received call from [facility] SN.   Requested clarification on orders since patient is no longer scheduled to see Dr Sharol Given next week.   Return call # [phone].

## 2019-03-18 NOTE — Telephone Encounter (Signed)
Call placed to [facility] SN. VO given to remove boot on Monday and use wound care per protocol.   Call placed to patient to make aware. LM on VM.

## 2019-03-18 NOTE — Telephone Encounter (Signed)
They can remove on Monday, she would still need a bandage of some sort placed over the ulcer

## 2019-03-21 ENCOUNTER — Ambulatory Visit: Payer: Self-pay | Admitting: Pharmacist

## 2019-03-21 ENCOUNTER — Telehealth: Payer: Self-pay | Admitting: *Deleted

## 2019-03-21 ENCOUNTER — Other Ambulatory Visit: Payer: Self-pay | Admitting: Pharmacist

## 2019-03-21 DIAGNOSIS — K921 Melena: Secondary | ICD-10-CM | POA: Diagnosis not present

## 2019-03-21 DIAGNOSIS — N183 Chronic kidney disease, stage 3 unspecified: Secondary | ICD-10-CM | POA: Diagnosis not present

## 2019-03-21 DIAGNOSIS — I130 Hypertensive heart and chronic kidney disease with heart failure and stage 1 through stage 4 chronic kidney disease, or unspecified chronic kidney disease: Secondary | ICD-10-CM | POA: Diagnosis not present

## 2019-03-21 DIAGNOSIS — C189 Malignant neoplasm of colon, unspecified: Secondary | ICD-10-CM | POA: Diagnosis not present

## 2019-03-21 DIAGNOSIS — N179 Acute kidney failure, unspecified: Secondary | ICD-10-CM | POA: Diagnosis not present

## 2019-03-21 DIAGNOSIS — E1151 Type 2 diabetes mellitus with diabetic peripheral angiopathy without gangrene: Secondary | ICD-10-CM | POA: Diagnosis not present

## 2019-03-21 DIAGNOSIS — Z4800 Encounter for change or removal of nonsurgical wound dressing: Secondary | ICD-10-CM | POA: Diagnosis not present

## 2019-03-21 DIAGNOSIS — E039 Hypothyroidism, unspecified: Secondary | ICD-10-CM | POA: Diagnosis not present

## 2019-03-21 DIAGNOSIS — I872 Venous insufficiency (chronic) (peripheral): Secondary | ICD-10-CM | POA: Diagnosis not present

## 2019-03-21 DIAGNOSIS — E1122 Type 2 diabetes mellitus with diabetic chronic kidney disease: Secondary | ICD-10-CM | POA: Diagnosis not present

## 2019-03-21 DIAGNOSIS — E1165 Type 2 diabetes mellitus with hyperglycemia: Secondary | ICD-10-CM | POA: Diagnosis not present

## 2019-03-21 DIAGNOSIS — I5032 Chronic diastolic (congestive) heart failure: Secondary | ICD-10-CM | POA: Diagnosis not present

## 2019-03-21 DIAGNOSIS — L97829 Non-pressure chronic ulcer of other part of left lower leg with unspecified severity: Secondary | ICD-10-CM | POA: Diagnosis not present

## 2019-03-21 NOTE — Telephone Encounter (Signed)
noted 

## 2019-03-21 NOTE — Telephone Encounter (Signed)
Received call from Verdis Frederickson [facility] SN with [place] [phone] telephone.   Reports that she evaluated patient for start of care today. Reports that [facility] boot was removed and calcium alginate with transparent Tegaderm applied to ulcer on LLE.   Reports thast she left supplies to change dressing QD, and she will see patient QW.

## 2019-03-21 NOTE — Patient Outreach (Signed)
[place] [facility]) Care Management  03/21/2019  Delisa Finck 1951/02/04 [id number]  Patient was called to follow up on blood sugars and post discharge. Unfortunately, she did not answer the phone. HIPAA compliant message was left on her voicemail.  Plan: Call patient back in 3-5 business days. Send unsuccessful contact letter.  [name], PharmD, [place] Clinical Pharmacist [phone]

## 2019-03-21 NOTE — Telephone Encounter (Signed)
Received return call from [place]. States that she spoke with Butch Penny at Dr. Lorie Apley office. Was advised that loose black stools were noted at appointment, but it did not look like any blood was present. Also states that she is on Fe, so stools may be darker. Advised to continue with their recommendations.

## 2019-03-21 NOTE — Telephone Encounter (Signed)
Received return call from [place].   Reports that patient continues to experience loose tarry stools. Advised to contact GI in regards to stools. Dr. Collene Mares, [place]- [phone]~ telephone/ [phone]~ fax. Verbalized understanding.

## 2019-03-22 ENCOUNTER — Ambulatory Visit: Payer: PPO | Admitting: Orthopedic Surgery

## 2019-03-24 ENCOUNTER — Other Ambulatory Visit: Payer: Self-pay | Admitting: Pharmacist

## 2019-03-24 ENCOUNTER — Ambulatory Visit: Payer: Self-pay | Admitting: Pharmacist

## 2019-03-24 NOTE — Patient Outreach (Signed)
[name]) Care Management  03/24/2019  Susan [name] 1951/02/19 [id number]  Patient was called regarding medication review post discharge and to follow up on diabetes management. Unfortunately, she did not answer either phone listed in her chart. The home number rang >20 without an answer. HIPAA compliant voicemail was left on her mobile number.  Patient was sent an unsuccessful outreach letter on 03/21/2019.  Plan: Call patient back in 7-10 business days.  [name], PharmD, [place] Clinical Pharmacist [phone]

## 2019-03-27 DIAGNOSIS — E1165 Type 2 diabetes mellitus with hyperglycemia: Secondary | ICD-10-CM | POA: Diagnosis not present

## 2019-03-27 DIAGNOSIS — I130 Hypertensive heart and chronic kidney disease with heart failure and stage 1 through stage 4 chronic kidney disease, or unspecified chronic kidney disease: Secondary | ICD-10-CM | POA: Diagnosis not present

## 2019-03-27 DIAGNOSIS — E1151 Type 2 diabetes mellitus with diabetic peripheral angiopathy without gangrene: Secondary | ICD-10-CM | POA: Diagnosis not present

## 2019-03-27 DIAGNOSIS — C189 Malignant neoplasm of colon, unspecified: Secondary | ICD-10-CM | POA: Diagnosis not present

## 2019-03-27 DIAGNOSIS — N183 Chronic kidney disease, stage 3 unspecified: Secondary | ICD-10-CM | POA: Diagnosis not present

## 2019-03-27 DIAGNOSIS — E039 Hypothyroidism, unspecified: Secondary | ICD-10-CM | POA: Diagnosis not present

## 2019-03-27 DIAGNOSIS — E1122 Type 2 diabetes mellitus with diabetic chronic kidney disease: Secondary | ICD-10-CM | POA: Diagnosis not present

## 2019-03-27 DIAGNOSIS — I872 Venous insufficiency (chronic) (peripheral): Secondary | ICD-10-CM | POA: Diagnosis not present

## 2019-03-27 DIAGNOSIS — I5032 Chronic diastolic (congestive) heart failure: Secondary | ICD-10-CM | POA: Diagnosis not present

## 2019-03-27 DIAGNOSIS — N179 Acute kidney failure, unspecified: Secondary | ICD-10-CM | POA: Diagnosis not present

## 2019-03-27 DIAGNOSIS — L97829 Non-pressure chronic ulcer of other part of left lower leg with unspecified severity: Secondary | ICD-10-CM | POA: Diagnosis not present

## 2019-03-27 DIAGNOSIS — Z4800 Encounter for change or removal of nonsurgical wound dressing: Secondary | ICD-10-CM | POA: Diagnosis not present

## 2019-03-27 DIAGNOSIS — K921 Melena: Secondary | ICD-10-CM | POA: Diagnosis not present

## 2019-03-28 ENCOUNTER — Other Ambulatory Visit: Payer: PPO

## 2019-03-28 ENCOUNTER — Other Ambulatory Visit: Payer: Self-pay

## 2019-03-28 ENCOUNTER — Other Ambulatory Visit: Payer: Self-pay | Admitting: Pharmacist

## 2019-03-28 DIAGNOSIS — D62 Acute posthemorrhagic anemia: Secondary | ICD-10-CM | POA: Diagnosis not present

## 2019-03-28 LAB — CBC WITH DIFFERENTIAL/PLATELET
Absolute Monocytes: 444 cells/uL (ref 200–950)
Basophils Absolute: 30 cells/uL (ref 0–200)
Basophils Relative: 0.4 %
Eosinophils Absolute: 414 cells/uL (ref 15–500)
Eosinophils Relative: 5.6 %
HCT: 33.6 % — ABNORMAL LOW (ref 35.0–45.0)
Hemoglobin: 10.1 g/dL — ABNORMAL LOW (ref 11.7–15.5)
Lymphs Abs: 636 cells/uL — ABNORMAL LOW (ref 850–3900)
MCH: 26.3 pg — ABNORMAL LOW (ref 27.0–33.0)
MCHC: 30.1 g/dL — ABNORMAL LOW (ref 32.0–36.0)
MCV: 87.5 fL (ref 80.0–100.0)
MPV: 12.4 fL (ref 7.5–12.5)
Monocytes Relative: 6 %
Neutro Abs: 5876 cells/uL (ref 1500–7800)
Neutrophils Relative %: 79.4 %
Platelets: 190 10*3/uL (ref 140–400)
RBC: 3.84 10*6/uL (ref 3.80–5.10)
RDW: 15.6 % — ABNORMAL HIGH (ref 11.0–15.0)
Total Lymphocyte: 8.6 %
WBC: 7.4 10*3/uL (ref 3.8–10.8)

## 2019-03-28 NOTE — Patient Outreach (Signed)
[place] [facility]) Care Management  03/28/2019  Susan [name] [birth year] [id number]   Patient was called regarding medication review post discharge and to follow up on diabetes management. Unfortunately, she did not answer either phone listed in her chart. HIPAA compliant voicemails were left on both phone numbers.  Patient was sent an unsuccessful outreach letter on 03/21/2019.  Plan: Call patient back in 1-2 weeks. If I cannot reach the patient, close her case.  [name], PharmD, [place] Clinical Pharmacist [phone]

## 2019-03-29 ENCOUNTER — Ambulatory Visit: Payer: PPO | Admitting: Orthopedic Surgery

## 2019-03-30 DIAGNOSIS — H43392 Other vitreous opacities, left eye: Secondary | ICD-10-CM | POA: Diagnosis not present

## 2019-03-30 DIAGNOSIS — H3582 Retinal ischemia: Secondary | ICD-10-CM | POA: Diagnosis not present

## 2019-03-30 DIAGNOSIS — H43813 Vitreous degeneration, bilateral: Secondary | ICD-10-CM | POA: Diagnosis not present

## 2019-03-30 DIAGNOSIS — E113513 Type 2 diabetes mellitus with proliferative diabetic retinopathy with macular edema, bilateral: Secondary | ICD-10-CM | POA: Diagnosis not present

## 2019-04-04 ENCOUNTER — Ambulatory Visit: Payer: Self-pay | Admitting: Pharmacist

## 2019-04-11 ENCOUNTER — Ambulatory Visit: Payer: Self-pay | Admitting: Pharmacist

## 2019-04-11 ENCOUNTER — Other Ambulatory Visit: Payer: Self-pay | Admitting: Pharmacist

## 2019-04-11 NOTE — Patient Outreach (Signed)
[place] [facility]) Care Management  04/11/2019  Doshie Maggi 01/05/1951 [id number]   Patient was called for follow up on diabetes management. Unfortunately, she did not answer either phone listed in her chart. HIPAA compliant voicemails were left on both phone numbers.  Patient was sent an unsuccessful outreach letter on 03/21/2019.  Plan: Patient's case will be closed. Closure letters will be sent to patient and her provider. [name] is no longer providing case management to traditional [facility] patients.  [name], PharmD, [place] Clinical Pharmacist [phone]

## 2019-04-12 ENCOUNTER — Ambulatory Visit: Payer: PPO | Admitting: *Deleted

## 2019-04-19 ENCOUNTER — Telehealth: Payer: Self-pay | Admitting: *Deleted

## 2019-04-19 NOTE — Telephone Encounter (Addendum)
Received call from [place], [facility] PT with [place]. [phone]~ telephone.   Reports that patient voiced C/O chest pain and SOB on 04/18/2019. States that she did take Nitro 2x on 11/23, but was taking medication at 45 minute intervals.   States that patient has no complaints today, but BP noted elevated at 180/90.  Call placed to patient to inquire. States that she had mid-sternal chest pain and SOB that was relieved by Nitro x1 in AM, and another episode in PM. States that she was more active on 11/23 by moving around furniture/ Tupperware tubs. States that she has had no episode of chest pain today.   Educated patient on Nitro administration. Advised medication can be taken Q 5 min for chest pain x3, but then she should go to ER for evaluation.  Advised if chest pain returns and is not resolved by Nitro, go to ER for evaluation.   Also advised that she monitor BP at home. States that she has monitor in home, but is unsure of exact placement at this time. States that she will have spouse look for monitor. If not found, she will buy a new monitor. Advised to F/U with Dr. Aundra Dubin cardiology for BP.  Call placed to [place], [facility] PT to make aware.

## 2019-04-19 NOTE — Telephone Encounter (Signed)
Agree with below. Addendum:    I believe that she has limits with moving and including, toileting, bathing, feeding, dressing and grooming. I believe the power wheelchair is needed for pt to be able to perform ADL's in her home

## 2019-04-20 ENCOUNTER — Telehealth: Payer: Self-pay | Admitting: *Deleted

## 2019-04-20 NOTE — Telephone Encounter (Signed)
Received call from Verdis Frederickson [facility] SN with [place] [phone] telephone.   Reports that upon review of medications, interaction found between colchicine and carvedilol.  Carvedilol increases serum levels of colchicine as it effects the way it is eliminated from the body.   MD to be made aware.

## 2019-04-20 NOTE — Telephone Encounter (Signed)
No changes, colchicine is prn medication

## 2019-04-26 DIAGNOSIS — N179 Acute kidney failure, unspecified: Secondary | ICD-10-CM | POA: Diagnosis not present

## 2019-04-26 DIAGNOSIS — I872 Venous insufficiency (chronic) (peripheral): Secondary | ICD-10-CM | POA: Diagnosis not present

## 2019-04-26 DIAGNOSIS — K921 Melena: Secondary | ICD-10-CM | POA: Diagnosis not present

## 2019-04-26 DIAGNOSIS — N183 Chronic kidney disease, stage 3 unspecified: Secondary | ICD-10-CM | POA: Diagnosis not present

## 2019-04-26 DIAGNOSIS — E1122 Type 2 diabetes mellitus with diabetic chronic kidney disease: Secondary | ICD-10-CM | POA: Diagnosis not present

## 2019-04-26 DIAGNOSIS — I130 Hypertensive heart and chronic kidney disease with heart failure and stage 1 through stage 4 chronic kidney disease, or unspecified chronic kidney disease: Secondary | ICD-10-CM | POA: Diagnosis not present

## 2019-04-26 DIAGNOSIS — L97829 Non-pressure chronic ulcer of other part of left lower leg with unspecified severity: Secondary | ICD-10-CM | POA: Diagnosis not present

## 2019-04-26 DIAGNOSIS — I5032 Chronic diastolic (congestive) heart failure: Secondary | ICD-10-CM | POA: Diagnosis not present

## 2019-04-26 DIAGNOSIS — Z4800 Encounter for change or removal of nonsurgical wound dressing: Secondary | ICD-10-CM | POA: Diagnosis not present

## 2019-04-26 DIAGNOSIS — E039 Hypothyroidism, unspecified: Secondary | ICD-10-CM | POA: Diagnosis not present

## 2019-04-26 DIAGNOSIS — E1165 Type 2 diabetes mellitus with hyperglycemia: Secondary | ICD-10-CM | POA: Diagnosis not present

## 2019-04-26 DIAGNOSIS — E1151 Type 2 diabetes mellitus with diabetic peripheral angiopathy without gangrene: Secondary | ICD-10-CM | POA: Diagnosis not present

## 2019-04-26 DIAGNOSIS — C189 Malignant neoplasm of colon, unspecified: Secondary | ICD-10-CM | POA: Diagnosis not present

## 2019-04-27 ENCOUNTER — Telehealth: Payer: Self-pay | Admitting: *Deleted

## 2019-04-27 NOTE — Telephone Encounter (Signed)
Received call from Amy, PT with [place] [phone] telephone.   Requested to extend [facility] PT services 2x weekly x3 weeks for balance, fall prevention, BLE strengthening, and gait.   VO given.

## 2019-04-27 NOTE — Telephone Encounter (Signed)
Agree with below. Addendum:    I believe that she has limits with moving and including, toileting, bathing, feeding, dressing and grooming. I believe the power wheelchair is needed for pt to be able to perform ADL's in her home

## 2019-04-28 ENCOUNTER — Encounter (HOSPITAL_COMMUNITY): Payer: Self-pay

## 2019-04-28 ENCOUNTER — Ambulatory Visit (HOSPITAL_COMMUNITY)
Admission: RE | Admit: 2019-04-28 | Discharge: 2019-04-28 | Disposition: A | Discharge status: Home / Self Care | Payer: PPO | Source: Ambulatory Visit | Attending: Adult Health | Admitting: Adult Health

## 2019-04-28 ENCOUNTER — Other Ambulatory Visit: Payer: Self-pay

## 2019-04-28 VITALS — BP 138/79 | HR 81 | Wt 268.2 lb

## 2019-04-28 DIAGNOSIS — Z8249 Family history of ischemic heart disease and other diseases of the circulatory system: Secondary | ICD-10-CM | POA: Insufficient documentation

## 2019-04-28 DIAGNOSIS — Z79890 Hormone replacement therapy (postmenopausal): Secondary | ICD-10-CM | POA: Insufficient documentation

## 2019-04-28 DIAGNOSIS — I252 Old myocardial infarction: Secondary | ICD-10-CM | POA: Insufficient documentation

## 2019-04-28 DIAGNOSIS — Z9641 Presence of insulin pump (external) (internal): Secondary | ICD-10-CM | POA: Insufficient documentation

## 2019-04-28 DIAGNOSIS — Z89431 Acquired absence of right foot: Secondary | ICD-10-CM | POA: Insufficient documentation

## 2019-04-28 DIAGNOSIS — F419 Anxiety disorder, unspecified: Secondary | ICD-10-CM | POA: Diagnosis not present

## 2019-04-28 DIAGNOSIS — Z7902 Long term (current) use of antithrombotics/antiplatelets: Secondary | ICD-10-CM | POA: Diagnosis not present

## 2019-04-28 DIAGNOSIS — Z8673 Personal history of transient ischemic attack (TIA), and cerebral infarction without residual deficits: Secondary | ICD-10-CM | POA: Diagnosis not present

## 2019-04-28 DIAGNOSIS — R5383 Other fatigue: Secondary | ICD-10-CM | POA: Insufficient documentation

## 2019-04-28 DIAGNOSIS — I10 Essential (primary) hypertension: Secondary | ICD-10-CM

## 2019-04-28 DIAGNOSIS — I872 Venous insufficiency (chronic) (peripheral): Secondary | ICD-10-CM | POA: Diagnosis not present

## 2019-04-28 DIAGNOSIS — E1122 Type 2 diabetes mellitus with diabetic chronic kidney disease: Secondary | ICD-10-CM | POA: Insufficient documentation

## 2019-04-28 DIAGNOSIS — F329 Major depressive disorder, single episode, unspecified: Secondary | ICD-10-CM | POA: Insufficient documentation

## 2019-04-28 DIAGNOSIS — E669 Obesity, unspecified: Secondary | ICD-10-CM | POA: Insufficient documentation

## 2019-04-28 DIAGNOSIS — I4510 Unspecified right bundle-branch block: Secondary | ICD-10-CM | POA: Diagnosis not present

## 2019-04-28 DIAGNOSIS — R0602 Shortness of breath: Secondary | ICD-10-CM | POA: Diagnosis not present

## 2019-04-28 DIAGNOSIS — Z87891 Personal history of nicotine dependence: Secondary | ICD-10-CM | POA: Insufficient documentation

## 2019-04-28 DIAGNOSIS — I428 Other cardiomyopathies: Secondary | ICD-10-CM | POA: Insufficient documentation

## 2019-04-28 DIAGNOSIS — Z794 Long term (current) use of insulin: Secondary | ICD-10-CM | POA: Insufficient documentation

## 2019-04-28 DIAGNOSIS — E039 Hypothyroidism, unspecified: Secondary | ICD-10-CM | POA: Diagnosis not present

## 2019-04-28 DIAGNOSIS — E785 Hyperlipidemia, unspecified: Secondary | ICD-10-CM | POA: Insufficient documentation

## 2019-04-28 DIAGNOSIS — I5032 Chronic diastolic (congestive) heart failure: Secondary | ICD-10-CM | POA: Diagnosis not present

## 2019-04-28 DIAGNOSIS — M1990 Unspecified osteoarthritis, unspecified site: Secondary | ICD-10-CM | POA: Diagnosis not present

## 2019-04-28 DIAGNOSIS — Z79899 Other long term (current) drug therapy: Secondary | ICD-10-CM | POA: Insufficient documentation

## 2019-04-28 DIAGNOSIS — Z7982 Long term (current) use of aspirin: Secondary | ICD-10-CM | POA: Insufficient documentation

## 2019-04-28 DIAGNOSIS — Z6841 Body Mass Index (BMI) 40.0 and over, adult: Secondary | ICD-10-CM | POA: Insufficient documentation

## 2019-04-28 DIAGNOSIS — N184 Chronic kidney disease, stage 4 (severe): Secondary | ICD-10-CM | POA: Insufficient documentation

## 2019-04-28 DIAGNOSIS — I2510 Atherosclerotic heart disease of native coronary artery without angina pectoris: Secondary | ICD-10-CM | POA: Insufficient documentation

## 2019-04-28 DIAGNOSIS — E119 Type 2 diabetes mellitus without complications: Secondary | ICD-10-CM | POA: Insufficient documentation

## 2019-04-28 DIAGNOSIS — I130 Hypertensive heart and chronic kidney disease with heart failure and stage 1 through stage 4 chronic kidney disease, or unspecified chronic kidney disease: Secondary | ICD-10-CM | POA: Insufficient documentation

## 2019-04-28 DIAGNOSIS — E1142 Type 2 diabetes mellitus with diabetic polyneuropathy: Secondary | ICD-10-CM | POA: Insufficient documentation

## 2019-04-28 DIAGNOSIS — Z955 Presence of coronary angioplasty implant and graft: Secondary | ICD-10-CM | POA: Insufficient documentation

## 2019-04-28 MED ORDER — NITROGLYCERIN 0.4 MG SL SUBL: 0.4000 mg | SUBLINGUAL_TABLET | SUBLINGUAL | 1 refills | Status: DC | PRN

## 2019-04-28 MED ORDER — PANTOPRAZOLE SODIUM 40 MG PO TBEC: 40.0000 mg | DELAYED_RELEASE_TABLET | Freq: Every day | ORAL | 1 refills | Status: DC

## 2019-04-28 MED ORDER — TORSEMIDE 20 MG PO TABS: 60.0000 mg | ORAL_TABLET | Freq: Two times a day (BID) | ORAL | 3 refills | Status: DC

## 2019-04-28 NOTE — Progress Notes (Signed)
04/28/2019 [name]   [birth year]  [id number]  Primary Physician Jeanice Lim Velna Hatchet, MD Primary Cardiologist: Arvilla Meres, MD  Electrophysiologist: None   HPI:  Susan [name] is a 68 y.o. female who is being seen today for 4 week f/u for chronic diastolic HF after recent diuretic dose adjustment.   PMH includes RBBB, DM, Hypothyroid, diastolic heart failure, CAD MI 2000/2007 BMS 2007, TIA, obesity, CKD Stage IV,  R transmetatarsal amputation. .    Admitted June 1st through June 3rd, 2017 with altered mental status, hypothermia, and weakness. Diuretics initially held and later restarted.   Admitted 2/27-07/28/17 for AKI. Torsemide was held and restarted at 60 mg BID. She had RHC 07/24/17 that showed mild PAH with evidence of RV strain likely due to OHS/OSA. She was discharged to SNF.  Admitted 01/2018 with A/C diastolic HF. Diuresed with IV lasix.   Admitted 10/24-10/27/19 with syncope. She was orthostatic. Had AKI with creatinine up to 3.33 and required IVF. Renal US showed no hydronephrosis. Losartan and imdur were DC'd. Creatinine 1.76 on day of DC.   Today she returns for HF follow up.Overall feeling fine. SOB with exertion but this is her baseline. Denies  PND/Orthopnea. Appetite ok. No fever or chills. She has not been weighing at home. Taking all medications. Lives with husband and son.     Current Meds  Medication Sig  . acetaminophen (TYLENOL) 500 MG tablet Take 1,000-1,500 mg by mouth every 6 (six) hours as needed for headache (pain).  Marland Kitchen allopurinol (ZYLOPRIM) 100 MG tablet Take 1 tablet (100 mg total) by mouth 2 (two) times daily.  . Ascorbic Acid (VITAMIN C PO) Take 1 tablet by mouth daily with supper.   Marland Kitchen aspirin EC 81 MG tablet Take 81 mg by mouth every morning.   . carvedilol (COREG) 12.5 MG tablet TAKE 1 TABLET BY MOUTH TWICE A DAY WITH MEALS  . cholecalciferol (VITAMIN D) 1000 units tablet Take 1,000 Units by mouth daily with supper.   . clopidogrel (PLAVIX)  75 MG tablet Take 1 tablet (75 mg total) by mouth daily.  . colchicine 0.6 MG tablet Take 1 tablet (0.6 mg total) by mouth daily. As needed for gout flare  . escitalopram (LEXAPRO) 20 MG tablet TAKE 1 TABLET BY MOUTH EVERY DAY  . ferrous sulfate 325 (65 FE) MG tablet Take 1 tablet (325 mg total) by mouth 2 (two) times daily with a meal. Reported on 08/21/2015  . fluconazole (DIFLUCAN) 100 MG tablet Take 1 tablet daily for 3 days  . Insulin Disposable Pump (OMNIPOD DASH 5 PACK PODS) MISC Inject into the skin See admin instructions. Use continuously with Novolin R - change every 72 hours. Manually add bolus to continuous dose via OmniPod 3 times daily per sliding scale: CBG 80-150 7 units, 151-200 9 units, 201-250 12 units, 251-300 14 units, 301-400 17 units.  . insulin NPH Human (HUMULIN N,NOVOLIN N) 100 UNIT/ML injection Inject 15 Units into the skin See admin instructions. Inject 15 units subcutaneously in the morning if CBG >300, inject 10 units if AM CBG 200-300  . levothyroxine (SYNTHROID, LEVOTHROID) 50 MCG tablet Take 1 tablet (50 mcg total) by mouth daily before breakfast.  . magnesium oxide (MAG-OX) 400 MG tablet TAKE 1 TABLET BY MOUTH EVERY DAY  . metolazone (ZAROXOLYN) 2.5 MG tablet Take 1 tablet (2.5 mg total) by mouth as directed. Monday and Friday  . nitroGLYCERIN (NITROSTAT) 0.4 MG SL tablet PLACE 1 TABLET (0.4 MG TOTAL) UNDER THE  TONGUE EVERY 5 (FIVE) MINUTES AS NEEDED FOR CHEST PAIN.  Marland Kitchen ONETOUCH VERIO test strip   . pantoprazole (PROTONIX) 40 MG tablet 1 tablet p.o. twice daily for 4 weeks then 1 tablet once daily  . pregabalin (LYRICA) 150 MG capsule Take 1 capsule (150 mg total) by mouth 2 (two) times daily.  Marland Kitchen PROAIR HFA 108 (90 Base) MCG/ACT inhaler TAKE 2 PUFFS BY MOUTH EVERY 6 HOURS AS NEEDED FOR WHEEZE OR SHORTNESS OF BREATH  . rosuvastatin (CRESTOR) 20 MG tablet TAKE 1 TABLET BY MOUTH EVERY DAY  . torsemide (DEMADEX) 20 MG tablet Take 80 mg by mouth See admin instructions. Take  4 tablets (80 mg) by mouth twice daily - every morning and afternoon (between 2p and 3p)  . traZODone (DESYREL) 100 MG tablet Take 1 tablet (100 mg total) by mouth at bedtime as needed for sleep.  . vitamin E (VITAMIN E) 1000 UNIT capsule Take 1,000 Units by mouth daily with supper.    Allergies  Allergen Reactions  . Codeine Nausea And Vomiting   Past Medical History:  Diagnosis Date  . Acute MI (HCC) 1999; 2007  . Anemia    hx  . Anginal pain (HCC)   . Anxiety   . ARF (acute renal failure) (HCC) 06/2017   [facility]  . Arthritis    "generalized" (03/15/2014)  . CAD (coronary artery disease)    MI in 2000 - MI  2007 - treated bare metal stent (no nuclear since then as 9/11)  . Carotid artery disease (HCC)   . CHF (congestive heart failure) (HCC)   . Chronic diastolic heart failure (HCC)    a) ECHO (08/2013) EF 55-60% and RV function nl b) RHC (08/2013) RA 4, RV 30/5/7, PA 25/10 (16), PCWP 7, Fick CO/CI 6.3/2.7, PVR 1.5 WU, PA 61 and 66%  . Daily headache    "~ every other day; since I fell in June" (03/15/2014)  . Depression   . Dyslipidemia   . Dyspnea   . Exertional shortness of breath   . HTN (hypertension)   . Hypothyroidism   . Neuropathy   . Obesity   . Osteoarthritis   . Peripheral neuropathy   . PONV (postoperative nausea and vomiting)   . RBBB (right bundle branch block)    Old  . Stroke [facility])    mini strokes  . Syncope    likely due to low blood sugar  . Tachycardia    Sinus tachycardia  . Type II diabetes mellitus (HCC)    Type II  . Urinary incontinence   . Venous insufficiency    Family History  Problem Relation Age of Onset  . Heart attack Mother 4   Past Surgical History:  Procedure Laterality Date  . ABDOMINAL HYSTERECTOMY  1980's  . AMPUTATION Right 02/24/2018   Procedure: RIGHT FOOT GREAT TOE AND 2ND TOE AMPUTATION;  Surgeon: Nadara Mustard, MD;  [facility];  Service: Orthopedics;  Laterality: Right;  . AMPUTATION Right  04/30/2018   Procedure: RIGHT TRANSMETATARSAL AMPUTATION;  Surgeon: Nadara Mustard, MD;  Location: [facility] OR;  Service: Orthopedics;  Laterality: Right;  . CATARACT EXTRACTION, BILATERAL Bilateral ?2013  . COLONOSCOPY W/ POLYPECTOMY    . COLONOSCOPY WITH PROPOFOL N/A 03/13/2019   Procedure: COLONOSCOPY WITH PROPOFOL;  Surgeon: Beverley Fiedler, MD;  [facility] END[facility]COPY;  Service: Gastroenterology;  Laterality: N/A;  . CORONARY ANGIOPLASTY WITH STENT PLACEMENT  1999; 2007   "1 + 1"  . ESOPHAGOGASTRODUODENOSCOPY (  EGD) WITH PROPOFOL N/A 03/13/2019   Procedure: ESOPHAGOGASTRODUODENOSCOPY (EGD) WITH PROPOFOL;  Surgeon: Beverley Fiedler, MD;  Location: [facility] ENDOSCOPY;  Service: Gastroenterology;  Laterality: N/A;  . EYE SURGERY Bilateral    [place]  . HEMOSTASIS CLIP PLACEMENT  03/13/2019   Procedure: HEMOSTASIS CLIP PLACEMENT;  Surgeon: Beverley Fiedler, MD;  Location: [facility] ENDOSCOPY;  Service: Gastroenterology;;  . KNEE ARTHROSCOPY Left 10/25/2006  . POLYPECTOMY  03/13/2019   Procedure: POLYPECTOMY;  Surgeon: Beverley Fiedler, MD;  Location[facility] ENDOSCOPY;  Service: Gastroenterology;;  . RIGHT HEART CATH N/A 07/24/2017   Procedure: RIGHT HEART CATH;  Surgeon: Dolores Patty, MD;  Location[facility] INVASIVE CV LAB;  Service: Cardiovascular;  Laterality: N/A;  . RIGHT HEART CATHETERIZATION N/A 09/22/2013   Procedure: RIGHT HEART CATH;  Surgeon: Dolores Patty, MD;  Location: [facility] CATH LAB;  Service: Cardiovascular;  Laterality: N/A;  . SHOULDER ARTHROSCOPY WITH OPEN ROTATOR CUFF REPAIR Right 03/14/2014   Procedure: RIGHT SHOULDER ARTHROSCOPY WITH BICEPS RELEASE, OPEN SUBSCAPULA REPAIR, OPEN SUPRASPINATUS REPAIR.;  Surgeon: Cammy Copa, MD;  Location: [facility] OR;  Service: Orthopedics;  Laterality: Right;  . TOE AMPUTATION Right 02/24/2018   GREAT TOE AND 2ND TOE AMPUTATION  . TUBAL LIGATION  1970's   Social History   Socioeconomic History  . Marital status: Married    Spouse name: Not on file  . Number of  children: 3  . Years of education: 12th  . Highest education level: Not on file  Occupational History    Employer: UNEMPLOYED  Social Needs  . Financial resource strain: Not on file  . Food insecurity    Worry: Not on file    Inability: Not on file  . Transportation needs    Medical: Not on file    Non-medical: Not on file  Tobacco Use  . Smoking status: Former Smoker    Packs/day: 3.00    Years: 32.00    Pack years: 96.00    Types: Cigarettes    Quit date: 10/24/1997    Years since quitting: 21.5  . Smokeless tobacco: Never Used  Substance and Sexual Activity  . Alcohol use: Not Currently    Comment: "might have 2-3 daiquiris in the summer"  . Drug use: No  . Sexual activity: Never  Lifestyle  . Physical activity    Days per week: Not on file    Minutes per session: Not on file  . Stress: Not on file  Relationships  . Social Musician on phone: Not on file    Gets together: Not on file    Attends religious service: Not on file    Active member of club or organization: Not on file    Attends meetings of clubs or organizations: Not on file    Relationship status: Not on file  . Intimate partner violence    Fear of current or ex partner: Not on file    Emotionally abused: Not on file    Physically abused: Not on file    Forced sexual activity: Not on file  Other Topics Concern  . Not on file  Social History Narrative   Pt lives at home with her spouse.   Caffeine Use- 3 sodas daily     Lipid Panel     Component Value Date/Time   CHOL 148 01/17/2019 1608   TRIG 244 (H) 01/17/2019 1608   HDL 53 01/17/2019 1608   CHOLHDL 2.8 01/17/2019 1608   VLDL 33 07/24/2017  0528   LDLCALC 65 01/17/2019 1608    Physical Exam:  Blood pressure 138/79, pulse 81, weight 121.7 kg (268 lb 3.2 oz), SpO2 97 %.  Wt Readings from Last 3 Encounters:  04/28/19 121.7 kg (268 lb 3.2 oz)  03/16/19 120.7 kg (266 lb)  03/14/19 116.7 kg (257 lb 3.2 oz)   General:  No resp  difficulty HEENT: normal Neck: supple. JVP 6-7. Carotids 2+ bilat; no bruits. No lymphadenopathy or thryomegaly appreciated. Cor: PMI nondisplaced. Regular rate & rhythm. No rubs, gallops or murmurs. Lungs: clear Abdomen: obese, soft, nontender, nondistended. No hepatosplenomegaly. No bruits or masses. Good bowel sounds. Extremities: no cyanosis, clubbing, rash, edema Neuro: alert & orientedx3, cranial nerves grossly intact. moves all 4 extremities w/o difficulty. Affect pleasant    ASSESSMENT AND PLAN:   1. Chronic Diastolic Heart Failure: NICM, Echo 06/2017: EF 55-60%, grade 1 DD - Echo 03/19/18: EF 55-60%, grade 1 DD - NYHA II-III. Volume status stable. Continue torsemide 60 mg twice a day + metolazone 2x per week.  - Discussed low salt food choices and limiting fluid intake to < 2 liter per day.    2.  Uncontrolled DM:  - Follows closely with Dr Lurene Shadow. Has an insulin pump.   3. CAD: history of CAD with BMS in 2007.  low risk nuc study in 2018 Denies angina  - Continue statin, asa and  blocker  4. Obesity: - Body mass index is 43.29 kg/m. Discussed portion control.   6. HTN:  Stable  7. CKD Stage III - Followed by Dr Signe Colt.   8. Day time fatigue - Refuses sleep study. We revisited this again today and does not want to pursue.   9.  S/p right transmetatarsal amputation - Followed by Dr Lajoyce Corners.    Doing well today. Follow up in 3-4 months.   Sahara Fujimoto NP-C  [name] 04/28/2019 2:01 PM

## 2019-04-28 NOTE — Patient Instructions (Signed)
DECREASE Torsemide to 60 mg (3 tabs) twice daily DECREASE Protonix to 40 mg, one tab daily  Your physician recommends that you schedule a follow-up appointment in: 3 months with Darrick Grinder, NP  Do the following things EVERYDAY: 1) Weigh yourself in the morning before breakfast. Write it down and keep it in a log. 2) Take your medicines as prescribed 3) Eat low salt foods-Limit salt (sodium) to 2000 mg per day.  4) Stay as active as you can everyday 5) Limit all fluids for the day to less than 2 liters  At the [facility], you and your health needs are our priority. As part of our continuing mission to provide you with exceptional heart care, we have created designated Provider Care Teams. These Care Teams include your primary Cardiologist (physician) and Advanced Practice Providers (APPs- Physician Assistants and Nurse Practitioners) who all work together to provide you with the care you need, when you need it.   You may see any of the following providers on your designated Care Team at your next follow up: Marland Kitchen Dr Glori Bickers . Dr Loralie Champagne . Darrick Grinder, NP . Lyda Jester, PA . Audry Riles, PharmD   Please be sure to bring in all your medications bottles to every appointment.

## 2019-05-06 IMAGING — NM NM PULMONARY VENT & PERF
16 series · 16 of 16 positions shown · non-contrast
Comparison: V/Q scan 02/14/2018. Portable chest radiograph 8822
hours today.

CLINICAL DATA: 67-year-old female with acute hypoxia and
hypotension.

EXAM:
NUCLEAR MEDICINE VENTILATION - PERFUSION LUNG SCAN
TECHNIQUE: Ventilation images were obtained in multiple projections using
inhaled aerosol Cc-44m DTPA. Perfusion images were obtained in
multiple projections after intravenous injection of [id number].
RADIOPHARMACEUTICALS:  30.0 mCi of Cc-44m DTPA aerosol inhalation
and 4.0 mCi Bc44m-C22 IV

[Series 1: ant/post vent · 4.14mm/px · 1 of 1 slices shown (1 of 2)]
[im 1/1]
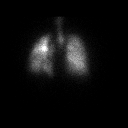

[Series 1: ant/post vent · 4.14mm/px · 1 of 1 slices shown (2 of 2)]
[im 1/1]
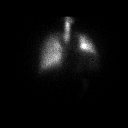

[Series 2: lao/rpo vent · 4.14mm/px · 1 of 1 slices shown (1 of 2)]
[im 1/1]
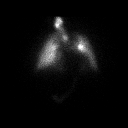

[Series 2: lao/rpo vent · 4.14mm/px · 1 of 1 slices shown (2 of 2)]
[im 1/1]
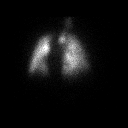

[Series 3: lpo/rao vent · 4.14mm/px · 1 of 1 slices shown (1 of 2)]
[im 1/1]
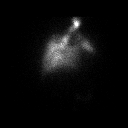

[Series 3: lpo/rao vent · 4.14mm/px · 1 of 1 slices shown (2 of 2)]
[im 1/1]
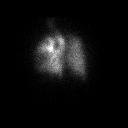

[Series 4: lt lat/rt lat vent · 4.14mm/px · 1 of 1 slices shown (1 of 2)]
[im 1/1]
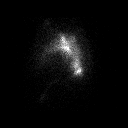

[Series 4: lt lat/rt lat vent · 4.14mm/px · 1 of 1 slices shown (2 of 2)]
[im 1/1]
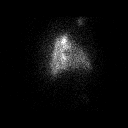

[Series 5: lt lat/rt lat perf · 4.14mm/px · 1 of 1 slices shown (1 of 2)]
[im 1/1]
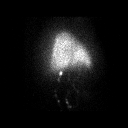

[Series 5: lt lat/rt lat perf · 4.14mm/px · 1 of 1 slices shown (2 of 2)]
[im 1/1]
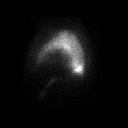

[Series 6: lpo/rao perf · 4.14mm/px · 1 of 1 slices shown (1 of 2)]
[im 1/1]
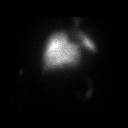

[Series 6: lpo/rao perf · 4.14mm/px · 1 of 1 slices shown (2 of 2)]
[im 1/1]
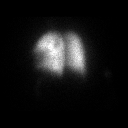

[Series 7: ant/post perf · 4.14mm/px · 1 of 1 slices shown (1 of 2)]
[im 1/1]
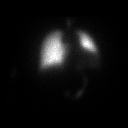

[Series 7: ant/post perf · 4.14mm/px · 1 of 1 slices shown (2 of 2)]
[im 1/1]
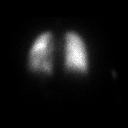

[Series 8: lao/rpo perf · 4.14mm/px · 1 of 1 slices shown (1 of 2)]
[im 1/1]
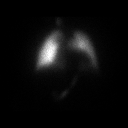

[Series 8: lao/rpo perf · 4.14mm/px · 1 of 1 slices shown (2 of 2)]
[im 1/1]
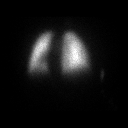

[16 of 16 positions shown; findings below may reference images not displayed]

FINDINGS: Ventilation: Stable and fairly homogeneous ventilation activity. No
focal ventilation defect.

Perfusion: Homogeneous perfusion activity. No wedge shaped
peripheral perfusion defects to suggest acute pulmonary embolism.
IMPRESSION: Stable and normal VQ scan.

## 2019-05-11 ENCOUNTER — Other Ambulatory Visit (HOSPITAL_COMMUNITY): Payer: Self-pay | Admitting: Cardiology

## 2019-05-11 ENCOUNTER — Other Ambulatory Visit: Payer: Self-pay | Admitting: Family Medicine

## 2019-05-12 ENCOUNTER — Other Ambulatory Visit: Payer: Self-pay | Admitting: Family Medicine

## 2019-05-13 ENCOUNTER — Encounter: Payer: Self-pay | Admitting: Family Medicine

## 2019-05-13 ENCOUNTER — Other Ambulatory Visit: Payer: Self-pay

## 2019-05-13 ENCOUNTER — Ambulatory Visit (INDEPENDENT_AMBULATORY_CARE_PROVIDER_SITE_OTHER): Payer: PPO | Admitting: Family Medicine

## 2019-05-13 DIAGNOSIS — E1165 Type 2 diabetes mellitus with hyperglycemia: Secondary | ICD-10-CM

## 2019-05-13 DIAGNOSIS — E162 Hypoglycemia, unspecified: Secondary | ICD-10-CM

## 2019-05-13 DIAGNOSIS — N184 Chronic kidney disease, stage 4 (severe): Secondary | ICD-10-CM | POA: Diagnosis not present

## 2019-05-13 DIAGNOSIS — B349 Viral infection, unspecified: Secondary | ICD-10-CM

## 2019-05-13 DIAGNOSIS — E1122 Type 2 diabetes mellitus with diabetic chronic kidney disease: Secondary | ICD-10-CM

## 2019-05-13 NOTE — Progress Notes (Signed)
Virtual Visit via Telephone Note  I connected with Susan [name] on 05/13/19 at 9:15am by telephone and verified that I am speaking with the correct person using two identifiers.      Pt location: at home   Physician location:  In office, [facility], Vic Blackbird MD     On call: patient and physician   I discussed the limitations, risks, security and privacy concerns of performing an evaluation and management service by telephone and the availability of in person appointments. I also discussed with the patient that there may be a patient responsible charge related to this service. The patient expressed understanding and agreed to proceed.   History of Present Illness: With significant past medical history including congestive heart failure, coronary artery disease, uncontrolled diabetes mellitus, peripheral vascular disease, chronic kidney disease, recent amputation of toes She has been sick on the stomach with dry heaves, diarrhea, no abd pain started On Monday, Tuesday noticed her sugar was dropping , she has not been able to eat anything which is why she feels her sugar has been dropping Then yesterday had emesis and dry heaves Blood sugar was 47 yesterday,w as able to get  No fever, had chills Monday only, has some chest congestion with a little greening sputum. She has a headache for hte past few days but no body aches. She has been taking tylenol, plus her regular meds She is only wearing her insulin pump and it disperes based on her blood sugar  She declines any redness on her legs or skin  He has not eaten anything this morning but last night she ate Jell-O and chocolate some of that stayed down.  States that she just woke up before visit so she went and checked her sugar   - Blood sugar is currently 270   Observations/Objective: NAD noted on phone, occ cough heard in background, speaking in full sentences   Assessment and Plan: Hypoglycemia/viral illness/CKD-she  is having significant hypoglycemia episodes and typically she is significantly hyperglycemic my concern is either for acute on her chronic renal failure which her creatinine is already 2.0  in the setting of an infection.  She has some viral upper respiratory symptoms she should have Covid testing as she is extremely high risk for complications.  Discussed with her needing to be evaluated in person preferably at the emergency room due to her history.  We went back and forth over her not having a ride at this time and her feeling uncomfortable driving which I understand that she has been having significant hypoglycemic episodes that come out of nowhere.  I recommended calling EMS she did not want to do this.  States that she is going to call her husband back and a friend that lives at the street and see if either one of them can come pick her up and take her to be evaluated.  She is also going to try to eat something after she gets off the phone with me and see if she keeps that down.   Follow Up Instructions:    I discussed the assessment and treatment plan with the patient. The patient was provided an opportunity to ask questions and all were answered. The patient agreed with the plan and demonstrated an understanding of the instructions.   The patient was advised to call back or seek an in-person evaluation if the symptoms worsen or if the condition fails to improve as anticipated.  I provided  20  minutes of  non-face-to-face time during this encounter. End time: 9:35am   Vic Blackbird, MD

## 2019-05-14 ENCOUNTER — Other Ambulatory Visit: Payer: Self-pay | Admitting: Family Medicine

## 2019-05-20 DIAGNOSIS — E1122 Type 2 diabetes mellitus with diabetic chronic kidney disease: Secondary | ICD-10-CM | POA: Diagnosis not present

## 2019-05-20 DIAGNOSIS — I130 Hypertensive heart and chronic kidney disease with heart failure and stage 1 through stage 4 chronic kidney disease, or unspecified chronic kidney disease: Secondary | ICD-10-CM | POA: Diagnosis not present

## 2019-05-20 DIAGNOSIS — Z8673 Personal history of transient ischemic attack (TIA), and cerebral infarction without residual deficits: Secondary | ICD-10-CM | POA: Diagnosis not present

## 2019-05-20 DIAGNOSIS — I5032 Chronic diastolic (congestive) heart failure: Secondary | ICD-10-CM | POA: Diagnosis not present

## 2019-05-20 DIAGNOSIS — N183 Chronic kidney disease, stage 3 unspecified: Secondary | ICD-10-CM | POA: Diagnosis not present

## 2019-05-20 DIAGNOSIS — I872 Venous insufficiency (chronic) (peripheral): Secondary | ICD-10-CM | POA: Diagnosis not present

## 2019-05-20 DIAGNOSIS — C189 Malignant neoplasm of colon, unspecified: Secondary | ICD-10-CM | POA: Diagnosis not present

## 2019-05-20 DIAGNOSIS — E1165 Type 2 diabetes mellitus with hyperglycemia: Secondary | ICD-10-CM | POA: Diagnosis not present

## 2019-05-20 DIAGNOSIS — E785 Hyperlipidemia, unspecified: Secondary | ICD-10-CM | POA: Diagnosis not present

## 2019-05-20 DIAGNOSIS — E1151 Type 2 diabetes mellitus with diabetic peripheral angiopathy without gangrene: Secondary | ICD-10-CM | POA: Diagnosis not present

## 2019-05-20 DIAGNOSIS — E039 Hypothyroidism, unspecified: Secondary | ICD-10-CM | POA: Diagnosis not present

## 2019-05-20 DIAGNOSIS — N179 Acute kidney failure, unspecified: Secondary | ICD-10-CM | POA: Diagnosis not present

## 2019-05-20 DIAGNOSIS — K921 Melena: Secondary | ICD-10-CM | POA: Diagnosis not present

## 2019-05-23 ENCOUNTER — Ambulatory Visit (INDEPENDENT_AMBULATORY_CARE_PROVIDER_SITE_OTHER): Payer: PPO | Admitting: Family Medicine

## 2019-05-23 ENCOUNTER — Other Ambulatory Visit: Payer: Self-pay

## 2019-05-23 ENCOUNTER — Encounter: Payer: Self-pay | Admitting: Family Medicine

## 2019-05-23 ENCOUNTER — Telehealth: Payer: Self-pay | Admitting: *Deleted

## 2019-05-23 VITALS — BP 128/72 | HR 88 | Temp 98.7°F | Resp 14 | Ht 66.0 in | Wt 266.0 lb

## 2019-05-23 DIAGNOSIS — R262 Difficulty in walking, not elsewhere classified: Secondary | ICD-10-CM | POA: Diagnosis not present

## 2019-05-23 DIAGNOSIS — N184 Chronic kidney disease, stage 4 (severe): Secondary | ICD-10-CM

## 2019-05-23 DIAGNOSIS — I2510 Atherosclerotic heart disease of native coronary artery without angina pectoris: Secondary | ICD-10-CM | POA: Diagnosis not present

## 2019-05-23 DIAGNOSIS — F331 Major depressive disorder, recurrent, moderate: Secondary | ICD-10-CM

## 2019-05-23 DIAGNOSIS — I5032 Chronic diastolic (congestive) heart failure: Secondary | ICD-10-CM | POA: Diagnosis not present

## 2019-05-23 DIAGNOSIS — E1122 Type 2 diabetes mellitus with diabetic chronic kidney disease: Secondary | ICD-10-CM

## 2019-05-23 DIAGNOSIS — I10 Essential (primary) hypertension: Secondary | ICD-10-CM

## 2019-05-23 DIAGNOSIS — E1165 Type 2 diabetes mellitus with hyperglycemia: Secondary | ICD-10-CM

## 2019-05-23 NOTE — Patient Instructions (Addendum)
Increase trazodone to 150mg  ( take 1.5 tablets for sleep) Referral to psychiatry for telehealth Immodium AD for diarrhea  F/U 4 months

## 2019-05-23 NOTE — Assessment & Plan Note (Signed)
We have addressed her depression multiple times we discussed medication changes she has been on multiple meds.  I think she needs psychotherapy along with a psychiatrist.  She is willing to do telehealth we will try to set this up for her.  In the meantime she does not want to change her medication as she has a good supply of it at home we will increase her trazodone to 150 mg at bedtime continue the Lexapro at 20 mg.

## 2019-05-23 NOTE — Telephone Encounter (Signed)
Received call from Amy, PT with [facility] [phone] telephone.   Requested to extend [facility] PT services 3x weekly x2 weeks for balance, fall prevention, BLE strengthening, and gait.   VO given.

## 2019-05-23 NOTE — Assessment & Plan Note (Signed)
Fairly compensated.  She does not have any signs of fluid overload in the respiratory them that I can tell.  She will continue her current dose of diuretic furosemide twice daily and metolazone.  Blood pressure looks good.

## 2019-05-23 NOTE — Assessment & Plan Note (Signed)
Uncontrolled diabetes mellitus with chronic kidney disease and neuropathy associated plus peripheral vascular disease.  Her A1c uncontrolled at 12%.  She has an insulin pump is not followed up with her endocrinology compliance is always been a significant issue.  We will recheck an interim A1c today since I have her in the office and able to get blood.

## 2019-05-23 NOTE — Assessment & Plan Note (Signed)
>>  ASSESSMENT AND PLAN FOR CHRONIC DIASTOLIC CHF (CONGESTIVE HEART FAILURE) (HCC) WRITTEN ON 05/23/2019  2:54 PM BY [place], KAWANTA F  Fairly compensated.  She does not have any signs of fluid overload in the respiratory them that I can tell.  She will continue her current dose of diuretic furosemide twice daily and metolazone.  Blood pressure looks good.

## 2019-05-23 NOTE — Progress Notes (Signed)
Subjective:    Patient ID: Susan [name], female    DOB: 09/20/1950, 68 y.o.   MRN: [id number]  Patient presents for Follow-up (is not fasting) Patient here to follow-up chronic medical problems.  See my previous phone visit about 10 days ago she called in with diarrhea and nausea dizzy spells hypoglycemia.  I advised her to go to the local emergency room to be evaluated because of her chronic medical problems which are quite significant including uncontrolled type 2 diabetes currently on insulin pump along with congestive heart failure coronary artery disease chronic kidney disease peripheral vascular disease.  She never sought care.  Diabetes mellitus followed by endocrinology uncontrolled for many years.  Recently having hypoglycemia episodes Her last A1c was 12% in October  Her blood sugar is now back up, this AM 267 so she gave 10 units of Humulin on top of what the pump gave her  Due for appointment with endocrinology she has not scheduled this yet.  She still has a little diarrhea, took kaopectate and no diarrhea today  Denies any cough and congestion, no nausea vomiting no fever recently  Congestive heart failure she is followed by cardiology, she takes demadex 60mg  BID, taking metalazone once a week   She declines Sleep study for OSA she knows she wouldn't wear a CPAP machine   MDD/GAD- feels like lexapro isnt helping , she has bene on paxil, trazodone, klonopin as well . Trazodone doesn't help very much with sleep , states she will lay there for 3-4 hours and not be able to sleep  She does not want to go physically to a psychiatrist because she does not want her husband to find out.  But she is able to do telehealth.  CKD- followed by nephrology   Hypothyroidism- Endocrinology is checking her thyroid   Does tell me she ate 30 minutes ago a chicken biscuit along with a large sweet tea  Review Of Systems:  GEN- denies fatigue, fever, weight loss,weakness, recent illness HEENT-  denies eye drainage, change in vision, nasal discharge, CVS- denies chest pain, palpitations RESP- denies SOB, cough, wheeze ABD- denies N/V, change in stools, abd pain GU- denies dysuria, hematuria, dribbling, incontinence MSK- denies joint pain, muscle aches, injury Neuro- denies headache, dizziness, syncope, seizure activity       Objective:    BP 128/72   Pulse 88   Temp 98.7 F (37.1 C) (Temporal)   Resp 14   Ht 5\' 6"  (1.676 m)   Wt 266 lb (120.7 kg)   SpO2 92%   BMI 42.93 kg/m  GEN- NAD, alert and oriented x3, nontoxic-appearing walks with a cane unsteady gait HEENT- PERRL, EOMI, non injected sclera, pink conjunctiva, MMM, oropharynx clear Neck- Supple, no thyromegaly CVS- RRR, no murmur RESP-CTAB ABD-NABS,soft,NT,ND Psych normal affect and mood EXT-chronic venous stasis changes Pulses- Radial 2+        Assessment & Plan:      Problem List Items Addressed This Visit      Unprioritized   CAD (coronary artery disease) (Chronic)   Chronic diastolic CHF (congestive heart failure) (HCC) (Chronic)    Fairly compensated.  She does not have any signs of fluid overload in the respiratory them that I can tell.  She will continue her current dose of diuretic furosemide twice daily and metolazone.  Blood pressure looks good.      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 mellitus ([place])    I am concerned with her recent viral  illness that she has acute on chronic kidney failure.  Her creatinine has been about 1.9 or 2 recently.  We will check her labs today along with her electrolytes.  Fortunately her diarrhea and vomiting has stopped      Diabetes mellitus type II, uncontrolled (HCC)    Uncontrolled diabetes mellitus with chronic kidney disease and neuropathy associated plus peripheral vascular disease.  Her A1c uncontrolled at 12%.  She has an insulin pump is not followed up with her endocrinology compliance is always been a significant issue.  We will recheck an interim A1c  today since I have her in the office and able to get blood.      Relevant Orders   Comprehensive metabolic panel   CBC with Differential   Hemoglobin A1c   Essential hypertension   Impaired ambulation   MDD (major depressive disorder) - Primary    We have addressed her depression multiple times we discussed medication changes she has been on multiple meds.  I think she needs psychotherapy along with a psychiatrist.  She is willing to do telehealth we will try to set this up for her.  In the meantime she does not want to change her medication as she has a good supply of it at home we will increase her trazodone to 150 mg at bedtime continue the Lexapro at 20 mg.      Morbid obesity ([place]) (Chronic)      Note: This dictation was prepared with Dragon dictation along with smaller phrase technology. Any transcriptional errors that result from this process are unintentional.

## 2019-05-23 NOTE — Assessment & Plan Note (Signed)
I am concerned with her recent viral illness that she has acute on chronic kidney failure.  Her creatinine has been about 1.9 or 2 recently.  We will check her labs today along with her electrolytes.  Fortunately her diarrhea and vomiting has stopped

## 2019-05-23 NOTE — Telephone Encounter (Signed)
Noted  

## 2019-05-24 LAB — COMPREHENSIVE METABOLIC PANEL
AG Ratio: 1.3 (calc) (ref 1.0–2.5)
ALT: 15 U/L (ref 6–29)
AST: 15 U/L (ref 10–35)
Albumin: 3.2 g/dL — ABNORMAL LOW (ref 3.6–5.1)
Alkaline phosphatase (APISO): 100 U/L (ref 37–153)
BUN/Creatinine Ratio: 18 (calc) (ref 6–22)
BUN: 36 mg/dL — ABNORMAL HIGH (ref 7–25)
CO2: 24 mmol/L (ref 20–32)
Calcium: 8.7 mg/dL (ref 8.6–10.4)
Chloride: 103 mmol/L (ref 98–110)
Creat: 1.95 mg/dL — ABNORMAL HIGH (ref 0.50–0.99)
Globulin: 2.4 g/dL (calc) (ref 1.9–3.7)
Glucose, Bld: 395 mg/dL — ABNORMAL HIGH (ref 65–99)
Potassium: 3.8 mmol/L (ref 3.5–5.3)
Sodium: 139 mmol/L (ref 135–146)
Total Bilirubin: 0.4 mg/dL (ref 0.2–1.2)
Total Protein: 5.6 g/dL — ABNORMAL LOW (ref 6.1–8.1)

## 2019-05-24 LAB — CBC WITH DIFFERENTIAL/PLATELET
Absolute Monocytes: 476 cells/uL (ref 200–950)
Basophils Absolute: 40 cells/uL (ref 0–200)
Basophils Relative: 0.6 %
Eosinophils Absolute: 436 cells/uL (ref 15–500)
Eosinophils Relative: 6.5 %
HCT: 37.2 % (ref 35.0–45.0)
Hemoglobin: 11.7 g/dL (ref 11.7–15.5)
Lymphs Abs: 784 cells/uL — ABNORMAL LOW (ref 850–3900)
MCH: 26 pg — ABNORMAL LOW (ref 27.0–33.0)
MCHC: 31.5 g/dL — ABNORMAL LOW (ref 32.0–36.0)
MCV: 82.7 fL (ref 80.0–100.0)
MPV: 11.7 fL (ref 7.5–12.5)
Monocytes Relative: 7.1 %
Neutro Abs: 4965 cells/uL (ref 1500–7800)
Neutrophils Relative %: 74.1 %
Platelets: 228 10*3/uL (ref 140–400)
RBC: 4.5 10*6/uL (ref 3.80–5.10)
RDW: 13.3 % (ref 11.0–15.0)
Total Lymphocyte: 11.7 %
WBC: 6.7 10*3/uL (ref 3.8–10.8)

## 2019-05-24 LAB — HEMOGLOBIN A1C
Hgb A1c MFr Bld: 13.1 % of total Hgb — ABNORMAL HIGH (ref <5.7)
Mean Plasma Glucose: 329 (calc)
eAG (mmol/L): 18.2 (calc)

## 2019-05-27 DIAGNOSIS — C189 Malignant neoplasm of colon, unspecified: Secondary | ICD-10-CM | POA: Diagnosis not present

## 2019-05-27 DIAGNOSIS — I5032 Chronic diastolic (congestive) heart failure: Secondary | ICD-10-CM | POA: Diagnosis not present

## 2019-05-27 DIAGNOSIS — I872 Venous insufficiency (chronic) (peripheral): Secondary | ICD-10-CM | POA: Diagnosis not present

## 2019-05-27 DIAGNOSIS — Z8673 Personal history of transient ischemic attack (TIA), and cerebral infarction without residual deficits: Secondary | ICD-10-CM | POA: Diagnosis not present

## 2019-05-27 DIAGNOSIS — E1122 Type 2 diabetes mellitus with diabetic chronic kidney disease: Secondary | ICD-10-CM | POA: Diagnosis not present

## 2019-05-27 DIAGNOSIS — K921 Melena: Secondary | ICD-10-CM | POA: Diagnosis not present

## 2019-05-27 DIAGNOSIS — I130 Hypertensive heart and chronic kidney disease with heart failure and stage 1 through stage 4 chronic kidney disease, or unspecified chronic kidney disease: Secondary | ICD-10-CM | POA: Diagnosis not present

## 2019-05-27 DIAGNOSIS — E1165 Type 2 diabetes mellitus with hyperglycemia: Secondary | ICD-10-CM | POA: Diagnosis not present

## 2019-05-27 DIAGNOSIS — E1151 Type 2 diabetes mellitus with diabetic peripheral angiopathy without gangrene: Secondary | ICD-10-CM | POA: Diagnosis not present

## 2019-05-27 DIAGNOSIS — E785 Hyperlipidemia, unspecified: Secondary | ICD-10-CM | POA: Diagnosis not present

## 2019-05-27 DIAGNOSIS — N179 Acute kidney failure, unspecified: Secondary | ICD-10-CM | POA: Diagnosis not present

## 2019-05-27 DIAGNOSIS — N183 Chronic kidney disease, stage 3 unspecified: Secondary | ICD-10-CM | POA: Diagnosis not present

## 2019-05-27 DIAGNOSIS — E039 Hypothyroidism, unspecified: Secondary | ICD-10-CM | POA: Diagnosis not present

## 2019-05-28 ENCOUNTER — Inpatient Hospital Stay (HOSPITAL_COMMUNITY)
Admission: EM | Admit: 2019-05-28 | Discharge: 2019-06-03 | DRG: 444 | Disposition: A | Discharge status: Home Health | Payer: PPO | Attending: Internal Medicine | Admitting: Internal Medicine

## 2019-05-28 ENCOUNTER — Emergency Department (HOSPITAL_COMMUNITY): Payer: PPO

## 2019-05-28 ENCOUNTER — Inpatient Hospital Stay (HOSPITAL_COMMUNITY): Payer: PPO

## 2019-05-28 ENCOUNTER — Other Ambulatory Visit: Payer: Self-pay

## 2019-05-28 ENCOUNTER — Encounter (HOSPITAL_COMMUNITY): Payer: Self-pay | Admitting: Emergency Medicine

## 2019-05-28 DIAGNOSIS — R748 Abnormal levels of other serum enzymes: Secondary | ICD-10-CM | POA: Diagnosis not present

## 2019-05-28 DIAGNOSIS — N390 Urinary tract infection, site not specified: Secondary | ICD-10-CM | POA: Diagnosis not present

## 2019-05-28 DIAGNOSIS — Z8249 Family history of ischemic heart disease and other diseases of the circulatory system: Secondary | ICD-10-CM

## 2019-05-28 DIAGNOSIS — E1165 Type 2 diabetes mellitus with hyperglycemia: Secondary | ICD-10-CM | POA: Diagnosis present

## 2019-05-28 DIAGNOSIS — E669 Obesity, unspecified: Secondary | ICD-10-CM | POA: Diagnosis present

## 2019-05-28 DIAGNOSIS — I5032 Chronic diastolic (congestive) heart failure: Secondary | ICD-10-CM | POA: Diagnosis present

## 2019-05-28 DIAGNOSIS — F329 Major depressive disorder, single episode, unspecified: Secondary | ICD-10-CM | POA: Diagnosis not present

## 2019-05-28 DIAGNOSIS — E782 Mixed hyperlipidemia: Secondary | ICD-10-CM | POA: Diagnosis not present

## 2019-05-28 DIAGNOSIS — I252 Old myocardial infarction: Secondary | ICD-10-CM

## 2019-05-28 DIAGNOSIS — I739 Peripheral vascular disease, unspecified: Secondary | ICD-10-CM | POA: Diagnosis present

## 2019-05-28 DIAGNOSIS — E876 Hypokalemia: Secondary | ICD-10-CM | POA: Diagnosis not present

## 2019-05-28 DIAGNOSIS — G9341 Metabolic encephalopathy: Secondary | ICD-10-CM | POA: Diagnosis not present

## 2019-05-28 DIAGNOSIS — Z6841 Body Mass Index (BMI) 40.0 and over, adult: Secondary | ICD-10-CM

## 2019-05-28 DIAGNOSIS — D649 Anemia, unspecified: Secondary | ICD-10-CM | POA: Diagnosis not present

## 2019-05-28 DIAGNOSIS — E1151 Type 2 diabetes mellitus with diabetic peripheral angiopathy without gangrene: Secondary | ICD-10-CM | POA: Diagnosis present

## 2019-05-28 DIAGNOSIS — R7989 Other specified abnormal findings of blood chemistry: Secondary | ICD-10-CM | POA: Diagnosis not present

## 2019-05-28 DIAGNOSIS — Z955 Presence of coronary angioplasty implant and graft: Secondary | ICD-10-CM

## 2019-05-28 DIAGNOSIS — A419 Sepsis, unspecified organism: Secondary | ICD-10-CM

## 2019-05-28 DIAGNOSIS — F419 Anxiety disorder, unspecified: Secondary | ICD-10-CM | POA: Diagnosis not present

## 2019-05-28 DIAGNOSIS — R509 Fever, unspecified: Secondary | ICD-10-CM | POA: Diagnosis not present

## 2019-05-28 DIAGNOSIS — E785 Hyperlipidemia, unspecified: Secondary | ICD-10-CM | POA: Diagnosis not present

## 2019-05-28 DIAGNOSIS — K8051 Calculus of bile duct without cholangitis or cholecystitis with obstruction: Principal | ICD-10-CM | POA: Diagnosis present

## 2019-05-28 DIAGNOSIS — B961 Klebsiella pneumoniae [K. pneumoniae] as the cause of diseases classified elsewhere: Secondary | ICD-10-CM | POA: Diagnosis present

## 2019-05-28 DIAGNOSIS — E039 Hypothyroidism, unspecified: Secondary | ICD-10-CM | POA: Diagnosis not present

## 2019-05-28 DIAGNOSIS — K831 Obstruction of bile duct: Secondary | ICD-10-CM | POA: Diagnosis not present

## 2019-05-28 DIAGNOSIS — E1142 Type 2 diabetes mellitus with diabetic polyneuropathy: Secondary | ICD-10-CM | POA: Diagnosis present

## 2019-05-28 DIAGNOSIS — M1990 Unspecified osteoarthritis, unspecified site: Secondary | ICD-10-CM | POA: Diagnosis present

## 2019-05-28 DIAGNOSIS — R0602 Shortness of breath: Secondary | ICD-10-CM | POA: Diagnosis not present

## 2019-05-28 DIAGNOSIS — R932 Abnormal findings on diagnostic imaging of liver and biliary tract: Secondary | ICD-10-CM | POA: Diagnosis not present

## 2019-05-28 DIAGNOSIS — I5023 Acute on chronic systolic (congestive) heart failure: Secondary | ICD-10-CM | POA: Diagnosis present

## 2019-05-28 DIAGNOSIS — I2510 Atherosclerotic heart disease of native coronary artery without angina pectoris: Secondary | ICD-10-CM | POA: Diagnosis not present

## 2019-05-28 DIAGNOSIS — E1122 Type 2 diabetes mellitus with diabetic chronic kidney disease: Secondary | ICD-10-CM | POA: Diagnosis not present

## 2019-05-28 DIAGNOSIS — Z20822 Contact with and (suspected) exposure to covid-19: Secondary | ICD-10-CM | POA: Diagnosis not present

## 2019-05-28 DIAGNOSIS — Z560 Unemployment, unspecified: Secondary | ICD-10-CM

## 2019-05-28 DIAGNOSIS — Z7902 Long term (current) use of antithrombotics/antiplatelets: Secondary | ICD-10-CM

## 2019-05-28 DIAGNOSIS — Z8673 Personal history of transient ischemic attack (TIA), and cerebral infarction without residual deficits: Secondary | ICD-10-CM

## 2019-05-28 DIAGNOSIS — Z9119 Patient's noncompliance with other medical treatment and regimen: Secondary | ICD-10-CM

## 2019-05-28 DIAGNOSIS — R6510 Systemic inflammatory response syndrome (SIRS) of non-infectious origin without acute organ dysfunction: Secondary | ICD-10-CM | POA: Diagnosis not present

## 2019-05-28 DIAGNOSIS — Z79899 Other long term (current) drug therapy: Secondary | ICD-10-CM

## 2019-05-28 DIAGNOSIS — I130 Hypertensive heart and chronic kidney disease with heart failure and stage 1 through stage 4 chronic kidney disease, or unspecified chronic kidney disease: Secondary | ICD-10-CM | POA: Diagnosis present

## 2019-05-28 DIAGNOSIS — K5900 Constipation, unspecified: Secondary | ICD-10-CM | POA: Diagnosis present

## 2019-05-28 DIAGNOSIS — N184 Chronic kidney disease, stage 4 (severe): Secondary | ICD-10-CM | POA: Diagnosis present

## 2019-05-28 DIAGNOSIS — Z885 Allergy status to narcotic agent status: Secondary | ICD-10-CM

## 2019-05-28 DIAGNOSIS — E66813 Obesity, class 3: Secondary | ICD-10-CM | POA: Diagnosis present

## 2019-05-28 DIAGNOSIS — I4510 Unspecified right bundle-branch block: Secondary | ICD-10-CM | POA: Diagnosis not present

## 2019-05-28 DIAGNOSIS — G9340 Encephalopathy, unspecified: Secondary | ICD-10-CM | POA: Diagnosis not present

## 2019-05-28 DIAGNOSIS — R7401 Elevation of levels of liver transaminase levels: Secondary | ICD-10-CM | POA: Diagnosis not present

## 2019-05-28 DIAGNOSIS — Z87891 Personal history of nicotine dependence: Secondary | ICD-10-CM

## 2019-05-28 DIAGNOSIS — K8020 Calculus of gallbladder without cholecystitis without obstruction: Secondary | ICD-10-CM | POA: Diagnosis not present

## 2019-05-28 DIAGNOSIS — I272 Pulmonary hypertension, unspecified: Secondary | ICD-10-CM | POA: Diagnosis present

## 2019-05-28 DIAGNOSIS — Z87440 Personal history of urinary (tract) infections: Secondary | ICD-10-CM

## 2019-05-28 DIAGNOSIS — N170 Acute kidney failure with tubular necrosis: Secondary | ICD-10-CM | POA: Diagnosis present

## 2019-05-28 DIAGNOSIS — Z794 Long term (current) use of insulin: Secondary | ICD-10-CM

## 2019-05-28 DIAGNOSIS — I5022 Chronic systolic (congestive) heart failure: Secondary | ICD-10-CM | POA: Diagnosis present

## 2019-05-28 DIAGNOSIS — R945 Abnormal results of liver function studies: Secondary | ICD-10-CM | POA: Diagnosis not present

## 2019-05-28 DIAGNOSIS — Z7982 Long term (current) use of aspirin: Secondary | ICD-10-CM

## 2019-05-28 DIAGNOSIS — Z79890 Hormone replacement therapy (postmenopausal): Secondary | ICD-10-CM

## 2019-05-28 LAB — LACTIC ACID, PLASMA: Lactic Acid, Venous: 1.9 mmol/L (ref 0.5–1.9)

## 2019-05-28 LAB — CBC
HCT: 43.1 % (ref 36.0–46.0)
HCT: 40.1 % (ref 36.0–46.0)
Hemoglobin: 13.6 g/dL (ref 12.0–15.0)
Hemoglobin: 12.5 g/dL (ref 12.0–15.0)
MCH: 25.7 pg — ABNORMAL LOW (ref 26.0–34.0)
MCH: 25.9 pg — ABNORMAL LOW (ref 26.0–34.0)
MCHC: 31.6 g/dL (ref 30.0–36.0)
MCHC: 31.2 g/dL (ref 30.0–36.0)
MCV: 81.3 fL (ref 80.0–100.0)
MCV: 83 fL (ref 80.0–100.0)
Platelets: 170 10*3/uL (ref 150–400)
Platelets: 149 10*3/uL — ABNORMAL LOW (ref 150–400)
RBC: 5.3 MIL/uL — ABNORMAL HIGH (ref 3.87–5.11)
RBC: 4.83 MIL/uL (ref 3.87–5.11)
RDW: 14.5 % (ref 11.5–15.5)
RDW: 14.6 % (ref 11.5–15.5)
WBC: 8.6 10*3/uL (ref 4.0–10.5)
WBC: 6.6 10*3/uL (ref 4.0–10.5)
nRBC: 0 % (ref 0.0–0.2)
nRBC: 0 % (ref 0.0–0.2)

## 2019-05-28 LAB — POC SARS CORONAVIRUS 2 AG -  ED: SARS Coronavirus 2 Ag: NEGATIVE

## 2019-05-28 LAB — URINALYSIS, ROUTINE W REFLEX MICROSCOPIC
Glucose, UA: >=500 mg/dL — AB
Ketones, ur: 5 mg/dL — AB
Nitrite: NEGATIVE
Protein, ur: >=300 mg/dL — AB
Specific Gravity, Urine: 1.016 (ref 1.005–1.030)
WBC, UA: >50 WBC/hpf — ABNORMAL HIGH (ref 0–5)
pH: 5 (ref 5.0–8.0)

## 2019-05-28 LAB — APTT: aPTT: 28 seconds (ref 24–36)

## 2019-05-28 LAB — COMPREHENSIVE METABOLIC PANEL
ALT: 363 U/L — ABNORMAL HIGH (ref 0–44)
AST: 450 U/L — ABNORMAL HIGH (ref 15–41)
Albumin: 2.6 g/dL — ABNORMAL LOW (ref 3.5–5.0)
Alkaline Phosphatase: 567 U/L — ABNORMAL HIGH (ref 38–126)
Anion gap: 13 (ref 5–15)
BUN: 42 mg/dL — ABNORMAL HIGH (ref 8–23)
CO2: 25 mmol/L (ref 22–32)
Calcium: 8.8 mg/dL — ABNORMAL LOW (ref 8.9–10.3)
Chloride: 97 mmol/L — ABNORMAL LOW (ref 98–111)
Creatinine, Ser: 2.41 mg/dL — ABNORMAL HIGH (ref 0.44–1.00)
GFR calc Af Amer: 23 mL/min — ABNORMAL LOW (ref >60)
GFR calc non Af Amer: 20 mL/min — ABNORMAL LOW (ref >60)
Glucose, Bld: 450 mg/dL — ABNORMAL HIGH (ref 70–99)
Potassium: 3.2 mmol/L — ABNORMAL LOW (ref 3.5–5.1)
Sodium: 135 mmol/L (ref 135–145)
Total Bilirubin: 6.6 mg/dL — ABNORMAL HIGH (ref 0.3–1.2)
Total Protein: 6 g/dL — ABNORMAL LOW (ref 6.5–8.1)

## 2019-05-28 LAB — POCT I-STAT EG7
Acid-Base Excess: 4 mmol/L — ABNORMAL HIGH (ref 0.0–2.0)
Bicarbonate: 28.3 mmol/L — ABNORMAL HIGH (ref 20.0–28.0)
Calcium, Ion: 1.07 mmol/L — ABNORMAL LOW (ref 1.15–1.40)
HCT: 42 % (ref 36.0–46.0)
Hemoglobin: 14.3 g/dL (ref 12.0–15.0)
O2 Saturation: 80 %
Potassium: 4.4 mmol/L (ref 3.5–5.1)
Sodium: 134 mmol/L — ABNORMAL LOW (ref 135–145)
TCO2: 29 mmol/L (ref 22–32)
pCO2, Ven: 41.2 mmHg — ABNORMAL LOW (ref 44.0–60.0)
pH, Ven: 7.444 — ABNORMAL HIGH (ref 7.250–7.430)
pO2, Ven: 43 mmHg (ref 32.0–45.0)

## 2019-05-28 LAB — CREATININE, SERUM
Creatinine, Ser: 2.7 mg/dL — ABNORMAL HIGH (ref 0.44–1.00)
GFR calc Af Amer: 20 mL/min — ABNORMAL LOW (ref >60)
GFR calc non Af Amer: 17 mL/min — ABNORMAL LOW (ref >60)

## 2019-05-28 LAB — TROPONIN I (HIGH SENSITIVITY)
Troponin I (High Sensitivity): 301 ng/L (ref <18)
Troponin I (High Sensitivity): 283 ng/L (ref <18)
Troponin I (High Sensitivity): 209 ng/L (ref <18)

## 2019-05-28 LAB — HEPATITIS PANEL, ACUTE
HCV Ab: NONREACTIVE
Hep A IgM: NONREACTIVE
Hep B C IgM: NONREACTIVE
Hepatitis B Surface Ag: NONREACTIVE

## 2019-05-28 LAB — CBG MONITORING, ED
Glucose-Capillary: 414 mg/dL — ABNORMAL HIGH (ref 70–99)
Glucose-Capillary: 445 mg/dL — ABNORMAL HIGH (ref 70–99)

## 2019-05-28 LAB — POC OCCULT BLOOD, ED: Fecal Occult Bld: NEGATIVE

## 2019-05-28 LAB — GLUCOSE, CAPILLARY: Glucose-Capillary: 350 mg/dL — ABNORMAL HIGH (ref 70–99)

## 2019-05-28 LAB — C-REACTIVE PROTEIN: CRP: 14.4 mg/dL — ABNORMAL HIGH (ref <1.0)

## 2019-05-28 LAB — PROTIME-INR
INR: 1.1 (ref 0.8–1.2)
Prothrombin Time: 13.7 seconds (ref 11.4–15.2)

## 2019-05-28 LAB — LIPASE, BLOOD: Lipase: 30 U/L (ref 11–51)

## 2019-05-28 LAB — BRAIN NATRIURETIC PEPTIDE: B Natriuretic Peptide: 586.5 pg/mL — ABNORMAL HIGH (ref 0.0–100.0)

## 2019-05-28 MED ORDER — SODIUM CHLORIDE 0.9 % IV BOLUS
1000.0000 mL | Freq: Once | INTRAVENOUS | Status: AC
  Administered 2019-05-28: 1000 mL via INTRAVENOUS

## 2019-05-28 MED ORDER — ACETAMINOPHEN 650 MG RE SUPP: 650.0000 mg | Freq: Four times a day (QID) | RECTAL | Status: DC | PRN

## 2019-05-28 MED ORDER — ACETAMINOPHEN 325 MG PO TABS
650.0000 mg | ORAL_TABLET | Freq: Once | ORAL | Status: AC
  Administered 2019-05-28: 650 mg via ORAL
  Filled 2019-05-28: qty 2

## 2019-05-28 MED ORDER — VANCOMYCIN HCL IN DEXTROSE 1-5 GM/200ML-% IV SOLN: 1000.0000 mg | Freq: Once | INTRAVENOUS | Status: DC

## 2019-05-28 MED ORDER — HEPARIN SODIUM (PORCINE) 5000 UNIT/ML IJ SOLN
5000.0000 [IU] | Freq: Three times a day (TID) | INTRAMUSCULAR | Status: DC
  Administered 2019-05-28 – 2019-06-03 (×18): 5000 [IU] via SUBCUTANEOUS
  Filled 2019-05-28 (×18): qty 1

## 2019-05-28 MED ORDER — INSULIN ASPART 100 UNIT/ML ~~LOC~~ SOLN
0.0000 [IU] | Freq: Every day | SUBCUTANEOUS | Status: DC
  Administered 2019-05-28: 4 [IU] via SUBCUTANEOUS

## 2019-05-28 MED ORDER — METRONIDAZOLE IN NACL 5-0.79 MG/ML-% IV SOLN
500.0000 mg | Freq: Three times a day (TID) | INTRAVENOUS | Status: DC
  Administered 2019-05-29 – 2019-06-01 (×11): 500 mg via INTRAVENOUS
  Filled 2019-05-28 (×11): qty 100

## 2019-05-28 MED ORDER — SENNOSIDES-DOCUSATE SODIUM 8.6-50 MG PO TABS
1.0000 | ORAL_TABLET | Freq: Every evening | ORAL | Status: DC | PRN
  Administered 2019-06-02: 1 via ORAL
  Filled 2019-05-28: qty 1

## 2019-05-28 MED ORDER — POTASSIUM CHLORIDE CRYS ER 20 MEQ PO TBCR
40.0000 meq | EXTENDED_RELEASE_TABLET | Freq: Once | ORAL | Status: AC
  Administered 2019-05-28: 40 meq via ORAL
  Filled 2019-05-28: qty 2

## 2019-05-28 MED ORDER — SODIUM CHLORIDE 0.9 % IV SOLN: INTRAVENOUS | Status: DC

## 2019-05-28 MED ORDER — INSULIN ASPART 100 UNIT/ML ~~LOC~~ SOLN
0.0000 [IU] | Freq: Three times a day (TID) | SUBCUTANEOUS | Status: DC
  Administered 2019-05-28 – 2019-05-29 (×2): 9 [IU] via SUBCUTANEOUS

## 2019-05-28 MED ORDER — METRONIDAZOLE IN NACL 5-0.79 MG/ML-% IV SOLN
500.0000 mg | Freq: Once | INTRAVENOUS | Status: AC
  Administered 2019-05-28: 500 mg via INTRAVENOUS
  Filled 2019-05-28: qty 100

## 2019-05-28 MED ORDER — SODIUM CHLORIDE 0.9 % IV SOLN
2.0000 g | INTRAVENOUS | Status: DC
  Administered 2019-05-29 – 2019-05-31 (×3): 2 g via INTRAVENOUS
  Filled 2019-05-28 (×4): qty 2

## 2019-05-28 MED ORDER — ACETAMINOPHEN 325 MG PO TABS
650.0000 mg | ORAL_TABLET | Freq: Four times a day (QID) | ORAL | Status: DC | PRN
  Administered 2019-05-28 – 2019-06-02 (×5): 650 mg via ORAL
  Filled 2019-05-28 (×5): qty 2

## 2019-05-28 MED ORDER — SODIUM CHLORIDE 0.9 % IV SOLN
2.0000 g | Freq: Once | INTRAVENOUS | Status: AC
  Administered 2019-05-28: 2 g via INTRAVENOUS
  Filled 2019-05-28: qty 2

## 2019-05-28 MED ORDER — VANCOMYCIN HCL 1250 MG/250ML IV SOLN
1250.0000 mg | INTRAVENOUS | Status: DC
  Filled 2019-05-28: qty 250

## 2019-05-28 MED ORDER — VANCOMYCIN HCL 2000 MG/400ML IV SOLN
2000.0000 mg | Freq: Once | INTRAVENOUS | Status: AC
  Administered 2019-05-28: 2000 mg via INTRAVENOUS
  Filled 2019-05-28 (×2): qty 400

## 2019-05-28 NOTE — ED Provider Notes (Signed)
[facility] EMERGENCY DEPARTMENT Provider Note   CSN: [id number] Arrival date & time: 05/28/19  1125     History Chief Complaint  Patient presents with  . Hyperglycemia  . Shortness of Breath    Susan [name] is a 69 y.o. female.  HPI    Pt is a 69 y/o female with a h/o CAD, MI anemia, CHF, CKD, HTN, HLD, DM, CVA who presents to the ED today for eval of multiple complaints.  Level 5 caveat as patient is confused.   Pt reports lower abd pain that has been constant for the last few days. She also reports nausea, vomiting, and diarrhea. Last BM was yesterday. Today has not had BM and has not passed gas. Denies hematemesis, bloody stools. States that her stools have been dark, but that she is on iron. She reports shortness of breath and cough. Denies chest pain. Reports BLE swelling that started yesterday.   1:43 PM Discussed case with patients husband. He states that patient had her sugars checked yesterday and it was elevated. He noticed that she seemed somewhat confused yesterday and has been talking and not making sense. He states patient has not eaten anything in 2 days, but has been drinking fluids. He states he shut off her insulin pump around 10:30AM. He states that she has a chronic cough and SOB and he denies that she has been complaining of increased SOB recently.   Past Medical History:  Diagnosis Date  . Acute MI ([place]) 1999; 2007  . Anemia    hx  . Anginal pain ([place])   . Anxiety   . ARF (acute renal failure) ([place]) 06/2017   [facility]  . Arthritis    "generalized" (03/15/2014)  . CAD (coronary artery disease)    MI in 2000 - MI  2007 - treated bare metal stent (no nuclear since then as 9/11)  . Carotid artery disease ([place])   . CHF (congestive heart failure) ([place])   . Chronic diastolic heart failure (HCC)    a) ECHO (08/2013) EF 55-60% and RV function nl b) RHC (08/2013) RA 4, RV 30/5/7, PA 25/10 (16), PCWP 7, Fick CO/CI 6.3/2.7, PVR 1.5 WU,  PA 61 and 66%  . Daily headache    "~ every other day; since I fell in June" (03/15/2014)  . Depression   . Dyslipidemia   . Dyspnea   . Exertional shortness of breath   . HTN (hypertension)   . Hypothyroidism   . Neuropathy   . Obesity   . Osteoarthritis   . Peripheral neuropathy   . PONV (postoperative nausea and vomiting)   . RBBB (right bundle branch block)    Old  . Stroke [facility])    mini strokes  . Syncope    likely due to low blood sugar  . Tachycardia    Sinus tachycardia  . Type II diabetes mellitus (HCC)    Type II  . Urinary incontinence   . Venous insufficiency     Patient Active Problem List   Diagnosis Date Noted  . Benign neoplasm of ascending colon   . Benign neoplasm of transverse colon   . Benign neoplasm of descending colon   . Benign neoplasm of sigmoid colon   . Gastric polyps   . Hyperkalemia 03/11/2019  . Prolonged QT interval 03/11/2019  . Onychomycosis 06/21/2018  . Pressure injury of skin 03/19/2018  . Syncope 03/18/2018  . Osteomyelitis of second toe of right foot ([place])   .  Toe osteomyelitis, right ([place])   . 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 ([place])   . PVD (peripheral vascular disease) ([place]) 10/26/2017  . Hypothyroid 07/27/2017  . PAH (pulmonary artery hypertension) ([place])   . Impaired ambulation 07/19/2017  . Leg cramps 02/27/2017  . Peripheral edema 01/12/2017  . Diabetic neuropathy ([place]) 11/12/2016  . 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 mellitus ([place]) 10/24/2015  . Anemia 10/03/2015  . Generalized anxiety disorder 10/03/2015  . Insomnia 10/03/2015  . Chest pain   . Gastritis 06/07/2015  . Diabetes mellitus type II, uncontrolled ([place]) 06/07/2015  . Chronic diastolic CHF (congestive heart failure) ([place]) 06/07/2015  . Non compliance with medical treatment 04/17/2014  . Rotator cuff tear 03/14/2014  . Morbid obesity ([place]) 09/23/2013  . Acute kidney injury superimposed on chronic kidney disease ([place]) 12/25/2012    . Hypotension 12/25/2012  . Urinary incontinence   . MDD (major depressive disorder) 11/12/2010  . RBBB (right bundle branch block)   . CAD (coronary artery disease)   . Hyperlipemia 01/22/2009  . Essential hypertension 01/22/2009    Past Surgical History:  Procedure Laterality Date  . ABDOMINAL HYSTERECTOMY  1980's  . AMPUTATION Right 02/24/2018   Procedure: RIGHT FOOT GREAT TOE AND 2ND TOE AMPUTATION;  Surgeon: Newt Minion, MD;  Location: [facility];  Service: Orthopedics;  Laterality: Right;  . AMPUTATION Right 04/30/2018   Procedure: RIGHT TRANSMETATARSAL AMPUTATION;  Surgeon: Newt Minion, MD;  Location: [facility];  Service: Orthopedics;  Laterality: Right;  . CATARACT EXTRACTION, BILATERAL Bilateral ?2013  . COLONOSCOPY W/ POLYPECTOMY    . COLONOSCOPY WITH PROPOFOL N/A 03/13/2019   Procedure: COLONOSCOPY WITH PROPOFOL;  Surgeon: Jerene Bears, MD;  Location: [facility];  Service: Gastroenterology;  Laterality: N/A;  . Bear; 2007   "1 + 1"  . ESOPHAGOGASTRODUODENOSCOPY (EGD) WITH PROPOFOL N/A 03/13/2019   Procedure: ESOPHAGOGASTRODUODENOSCOPY (EGD) WITH PROPOFOL;  Surgeon: Jerene Bears, MD;  Location: [facility] ENDOSCOPY;  Service: Gastroenterology;  Laterality: N/A;  . EYE SURGERY Bilateral    lazer  . HEMOSTASIS CLIP PLACEMENT  03/13/2019   Procedure: HEMOSTASIS CLIP PLACEMENT;  Surgeon: Jerene Bears, MD;  Location: [facility] ENDOSCOPY;  Service: Gastroenterology;;  . KNEE ARTHROSCOPY Left 10/25/2006  . POLYPECTOMY  03/13/2019   Procedure: POLYPECTOMY;  Surgeon: Jerene Bears, MD;  Location: [facility];  Service: Gastroenterology;;  . RIGHT HEART CATH N/A 07/24/2017   Procedure: RIGHT HEART CATH;  Surgeon: Jolaine Artist, MD;  Location: [facility] CV LAB;  Service: Cardiovascular;  Laterality: N/A;  . RIGHT HEART CATHETERIZATION N/A 09/22/2013   Procedure: RIGHT HEART CATH;  Surgeon: Jolaine Artist, MD;  Location: [facility] CATH LAB;  Service:  Cardiovascular;  Laterality: N/A;  . SHOULDER ARTHROSCOPY WITH OPEN ROTATOR CUFF REPAIR Right 03/14/2014   Procedure: RIGHT SHOULDER ARTHROSCOPY WITH BICEPS RELEASE, OPEN SUBSCAPULA REPAIR, OPEN SUPRASPINATUS REPAIR.;  Surgeon: Meredith Pel, MD;  Location: [facility];  Service: Orthopedics;  Laterality: Right;  . TOE AMPUTATION Right 02/24/2018   GREAT TOE AND 2ND TOE AMPUTATION  . TUBAL LIGATION  1970's     OB History   No obstetric history on file.     Family History  Problem Relation Age of Onset  . Heart attack Mother 59    Social History   Tobacco Use  . Smoking status: Former Smoker    Packs/day: 3.00    Years: 32.00    Pack years:  96.00    Types: Cigarettes    Quit date: 10/24/1997    Years since quitting: 21.6  . Smokeless tobacco: Never Used  Substance Use Topics  . Alcohol use: Not Currently    Comment: "might have 2-3 daiquiris in the summer"  . Drug use: No    Home Medications Prior to Admission medications   Medication Sig Start Date End Date Taking? Authorizing Provider  acetaminophen (TYLENOL) 500 MG tablet Take 1,000-1,500 mg by mouth every 6 (six) hours as needed for headache (pain).    [provider]  allopurinol (ZYLOPRIM) 100 MG tablet Take 1 tablet (100 mg total) by mouth 2 (two) times daily. 10/14/18   Newt Minion, MD  Ascorbic Acid (VITAMIN C PO) Take 1 tablet by mouth daily with supper.     [provider]  aspirin EC 81 MG tablet Take 81 mg by mouth every morning.     [provider]  carvedilol (COREG) 12.5 MG tablet TAKE 1 TABLET BY MOUTH TWICE A DAY WITH MEALS 05/12/19   [place], [name], MD  cholecalciferol (VITAMIN D) 1000 units tablet Take 1,000 Units by mouth daily with supper.     [provider]  clopidogrel (PLAVIX) 75 MG tablet TAKE 1 TABLET BY MOUTH EVERY DAY WITH BREAKFAST 05/11/19   Clegg, Amy D, NP  colchicine 0.6 MG tablet Take 1 tablet (0.6 mg total) by mouth daily. As needed for gout flare  10/14/18   Newt Minion, MD  escitalopram (LEXAPRO) 20 MG tablet TAKE 1 TABLET BY MOUTH EVERY DAY 05/11/19   [name], MD  ferrous sulfate 325 (65 FE) MG tablet Take 1 tablet (325 mg total) by mouth 2 (two) times daily with a meal. Reported on 08/21/2015 06/07/18   Alycia Rossetti, MD  fluconazole (DIFLUCAN) 100 MG tablet Take 1 tablet daily for 3 days 03/16/19   Alycia Rossetti, MD  Insulin Disposable Pump (OMNIPOD DASH 5 PACK PODS) MISC Inject into the skin See admin instructions. Use continuously with Novolin R - change every 72 hours. Manually add bolus to continuous dose via OmniPod 3 times daily per sliding scale: CBG 80-150 7 units, 151-200 9 units, 201-250 12 units, 251-300 14 units, 301-400 17 units. 11/18/18   [provider]  insulin NPH Human (HUMULIN N,NOVOLIN N) 100 UNIT/ML injection Inject 15 Units into the skin See admin instructions. Inject 15 units subcutaneously in the morning if CBG >300, inject 10 units if AM CBG 200-300    [provider]  levothyroxine (SYNTHROID, LEVOTHROID) 50 MCG tablet Take 1 tablet (50 mcg total) by mouth daily before breakfast. 11/07/16   Dena Billet B, PA-C  magnesium oxide (MAG-OX) 400 MG tablet TAKE 1 TABLET BY MOUTH EVERY DAY 09/17/18   [place], [name], MD  metolazone (ZAROXOLYN) 2.5 MG tablet Take 1 tablet (2.5 mg total) by mouth as directed. Monday and Friday 12/13/18   Darrick Grinder D, NP  nitroGLYCERIN (NITROSTAT) 0.4 MG SL tablet Place 1 tablet (0.4 mg total) under the tongue every 5 (five) minutes as needed for chest pain. 04/28/19   Conrad Negaunee, NP  ONETOUCH VERIO test strip  11/02/17   [provider]  pantoprazole (PROTONIX) 40 MG tablet Take 1 tablet (40 mg total) by mouth daily. 04/28/19   Clegg, Amy D, NP  pregabalin (LYRICA) 150 MG capsule Take 1 capsule (150 mg total) by mouth 2 (two) times daily. 11/12/16   Alycia Rossetti, MD  [facility]  HFA 108 (90 Base) MCG/ACT inhaler TAKE 2 PUFFS BY MOUTH EVERY 6 HOURS AS  NEEDED FOR WHEEZE OR SHORTNESS OF BREATH 01/11/19   [place], [name], MD  rosuvastatin (CRESTOR) 20 MG tablet TAKE 1 TABLET BY MOUTH EVERY DAY 12/20/18   Alycia Rossetti, MD  torsemide (DEMADEX) 20 MG tablet Take 3 tablets (60 mg total) by mouth 2 (two) times daily. 04/28/19   Clegg, Amy D, NP  traZODone (DESYREL) 100 MG tablet TAKE 1 TABLET (100 MG TOTAL) BY MOUTH AT BEDTIME AS NEEDED FOR SLEEP. 05/16/19   Alycia Rossetti, MD  vitamin E (VITAMIN E) 1000 UNIT capsule Take 1,000 Units by mouth daily with supper.     [provider]    Allergies    Codeine  Review of Systems   Review of Systems  Unable to perform ROS: Mental status change  Constitutional: Positive for fever.  Respiratory: Positive for cough and shortness of breath.   Cardiovascular: Negative for chest pain.  Gastrointestinal: Positive for abdominal pain, diarrhea, nausea and vomiting.    Physical Exam Updated Vital Signs BP 132/85 (BP Location: Left Arm)   Pulse 94   Temp (S) (!) 100.8 F (38.2 C) (Rectal)   Resp 18   SpO2 95%   Physical Exam Vitals and nursing note reviewed.  Constitutional:      General: She is not in acute distress.    Appearance: She is well-developed. She is ill-appearing and toxic-appearing.  HENT:     Head: Normocephalic and atraumatic.  Eyes:     Conjunctiva/sclera: Conjunctivae normal.  Cardiovascular:     Rate and Rhythm: Normal rate and regular rhythm.     Heart sounds: No murmur.  Pulmonary:     Effort: Pulmonary effort is normal. No respiratory distress.     Breath sounds: Normal breath sounds. No decreased breath sounds, wheezing, rhonchi or rales.  Abdominal:     General: Bowel sounds are decreased.     Palpations: Abdomen is soft.     Tenderness: There is abdominal tenderness (diffusely TTP, but worse to the RUQ). There is guarding. There is no rebound.  Musculoskeletal:     Cervical back: Neck supple.     Right lower leg: No tenderness. No edema.     Left  lower leg: No tenderness. No edema.  Skin:    General: Skin is warm and dry.     Coloration: Skin is jaundiced.  Neurological:     Mental Status: She is alert.     Comments: Mental Status:  Alert, but confused. Answering some questions inappropriately. Speech is slowed but clear. Oriented to self, place, but not situation or time. Can follow simple commands.  Cranial Nerves:  II:  Peripheral visual fields grossly normal, pupils equal, round, reactive to light III,IV, VI: ptosis not present, extra-ocular motions intact bilaterally  V,VII: smile symmetric, facial light touch sensation equal VIII: hearing grossly normal to voice  X: uvula elevates symmetrically  XI: bilateral shoulder shrug symmetric and strong XII: midline tongue extension without fassiculations Motor:  Normal tone. 5/5 strength of BUE and BLE major muscle groups including strong and equal grip strength and dorsiflexion/plantar flexion Sensory: light touch normal in all extremities. Gait: not assessed  CV: 2+ radial and DP pulses     ED Results / Procedures / Treatments   Labs (all labs ordered are listed, but only abnormal results are displayed) Labs Reviewed  CBC - Abnormal; Notable for the following components:  Result Value   RBC 5.30 (*)    MCH 25.7 (*)    All other components within normal limits  COMPREHENSIVE METABOLIC PANEL - Abnormal; Notable for the following components:   Potassium 3.2 (*)    Chloride 97 (*)    Glucose, Bld 450 (*)    BUN 42 (*)    Creatinine, Ser 2.41 (*)    Calcium 8.8 (*)    Total Protein 6.0 (*)    Albumin 2.6 (*)    AST 450 (*)    ALT 363 (*)    Alkaline Phosphatase 567 (*)    Total Bilirubin 6.6 (*)    GFR calc non Af Amer 20 (*)    GFR calc Af Amer 23 (*)    All other components within normal limits  BRAIN NATRIURETIC PEPTIDE - Abnormal; Notable for the following components:   B Natriuretic Peptide 586.5 (*)    All other components within normal limits  CBG  MONITORING, ED - Abnormal; Notable for the following components:   Glucose-Capillary 414 (*)    All other components within normal limits  POCT I-STAT EG7 - Abnormal; Notable for the following components:   pH, Ven 7.444 (*)    pCO2, Ven 41.2 (*)    Bicarbonate 28.3 (*)    Acid-Base Excess 4.0 (*)    Sodium 134 (*)    Calcium, Ion 1.07 (*)    All other components within normal limits  TROPONIN I (HIGH SENSITIVITY) - Abnormal; Notable for the following components:   Troponin I (High Sensitivity) 301 (*)    All other components within normal limits  CULTURE, BLOOD (ROUTINE X 2)  CULTURE, BLOOD (ROUTINE X 2)  URINE CULTURE  SARS CORONAVIRUS 2 (TAT 6-24 HRS)  PROTIME-INR  LACTIC ACID, PLASMA  APTT  LIPASE, BLOOD  URINALYSIS, ROUTINE W REFLEX MICROSCOPIC  LACTIC ACID, PLASMA  HEPATITIS PANEL, ACUTE  POC OCCULT BLOOD, ED  POC SARS CORONAVIRUS 2 AG -  ED  I-STAT VENOUS BLOOD GAS, ED  CBG MONITORING, ED  TYPE AND SCREEN  TROPONIN I (HIGH SENSITIVITY)    EKG EKG Interpretation  Date/Time:  Saturday May 28 2019 15:29:25 EST Ventricular Rate:  81 PR Interval:    QRS Duration: 132 QT Interval:  449 QTC Calculation: 522 R Axis:   -61 Text Interpretation: Sinus rhythm Right bundle branch block Left ventricular hypertrophy Inferior infarct, old Probable anterior infarct, age indeterminate Confirmed by Pattricia Boss [phone]) on 05/28/2019 3:32:00 PM   Radiology CT ABDOMEN PELVIS WO CONTRAST  Result Date: 05/28/2019 CLINICAL DATA:  Elevated liver function test, generalized abdominal pain. EXAM: CT ABDOMEN AND PELVIS WITHOUT CONTRAST TECHNIQUE: Multidetector CT imaging of the abdomen and pelvis was performed following the standard protocol without IV contrast. COMPARISON:  March 11, 2019. FINDINGS: Lower chest: Mild right middle lobe subsegmental atelectasis or infiltrate is noted. Hepatobiliary: Mild cholelithiasis is noted. No biliary dilatation is noted. No focal abnormality is  seen in the liver on these unenhanced images. Pancreas: Unremarkable. No pancreatic ductal dilatation or surrounding inflammatory changes. Spleen: Calcified splenic granulomata are noted. Adrenals/Urinary Tract: Adrenal glands are unremarkable. Kidneys are normal, without renal calculi, focal lesion, or hydronephrosis. Bladder is unremarkable. Stomach/Bowel: Stomach is within normal limits. Appendix appears normal. No evidence of bowel wall thickening, distention, or inflammatory changes. Vascular/Lymphatic: Aortic atherosclerosis. No enlarged abdominal or pelvic lymph nodes. Reproductive: Prostate is unremarkable. Other: No abdominal wall hernia or abnormality. No abdominopelvic ascites. Musculoskeletal: No acute or significant osseous findings. IMPRESSION: Cholelithiasis.  Mild right middle lobe subsegmental atelectasis or infiltrate is noted. Aortic Atherosclerosis (ICD10-I70.0). Electronically Signed   By: Marijo Conception M.D.   On: 05/28/2019 14:50   DG Chest 2 View  Result Date: 05/28/2019 CLINICAL DATA:  Shortness of breath. Elevated blood sugar. Hypertension. CHF. EXAM: CHEST - 2 VIEW COMPARISON:  03/11/2019 FINDINGS: Minimally motion degraded lateral view. Patient rotated left on the frontal radiograph. Borderline cardiomegaly. Atherosclerosis in the transverse aorta. No pleural effusion or pneumothorax. No congestive failure. Clear lungs. IMPRESSION: No acute cardiopulmonary disease. Aortic Atherosclerosis (ICD10-I70.0). Electronically Signed   By: Abigail Miyamoto M.D.   On: 05/28/2019 12:15   CT Head Wo Contrast  Result Date: 05/28/2019 CLINICAL DATA:  Hyperglycemia, encephalopathy EXAM: CT HEAD WITHOUT CONTRAST TECHNIQUE: Contiguous axial images were obtained from the base of the skull through the vertex without intravenous contrast. COMPARISON:  10/25/2015 FINDINGS: Brain: Brain atrophy and chronic white matter microvascular ischemic changes throughout both cerebral hemispheres. No acute intracranial  hemorrhage, new mass lesion, acute infarction, midline shift, herniation, hydrocephalus, or extra-axial fluid collection. No focal mass effect or edema. Cisterns are patent. Cerebellar atrophy as well. Vascular: No hyperdense vessel or unexpected calcification. Skull: Normal. Negative for fracture or focal lesion. Sinuses/Orbits: Minor maxillary mucosal thickening. No sinus air-fluid level or significant opacification. Orbits are symmetric. No acute finding. Other: None. IMPRESSION: Stable atrophy and chronic white matter microvascular ischemic changes. No acute intracranial abnormality by noncontrast CT. Electronically Signed   By: Jerilynn Mages.  Shick M.D.   On: 05/28/2019 14:46    Procedures Procedures (including critical care time)  Medications Ordered in ED Medications  ceFEPIme (MAXIPIME) 2 g in sodium chloride 0.9 % 100 mL IVPB (has no administration in time range)  metroNIDAZOLE (FLAGYL) IVPB 500 mg (has no administration in time range)  vancomycin (VANCOCIN) IVPB 1000 mg/200 mL premix (has no administration in time range)  acetaminophen (TYLENOL) tablet 650 mg (650 mg Oral Given 05/28/19 1427)  sodium chloride 0.9 % bolus 1,000 mL (1,000 mLs Intravenous New Bag/Given 05/28/19 1428)  potassium chloride SA (KLOR-CON) CR tablet 40 mEq (40 mEq Oral Given 05/28/19 1428)    ED Course  I have reviewed the triage vital signs and the nursing notes.  Pertinent labs & imaging results that were available during my care of the patient were reviewed by me and considered in my medical decision making (see chart for details).    MDM Rules/Calculators/A&P                      69 year old female presenting for evaluation of abdominal pain, nausea, vomiting, diarrhea, confusion.  Also with cough and shortness of breath that is chronic per husband.  On arrival, patient with fever of 100.74F, heart rate greater than 90, altered mental status and suspected source of infection being intra-abdominal cause.  Code sepsis  initiated.  Fluid resuscitation initiated as well as broad-spectrum antibiotics.  Labs completed in triage were reviewed, CBC is without anemia or leukocytosis CMP with hypokalemia at 3.2.  Normal bicarb.  Elevated blood glucose at 450.  Elevated BUN/creatinine at 42 and 2.41.  This is worse from her baseline of around 1.9-2.1.  LFTs are grossly abnormal which is new from 5 days ago.  AST/ALT are 450 and 363.  Bilirubin is also abnormal at 6.6, it was 0.4 five days ago. Lipase negative Coags neg BNP elevated at 500 Lactic acid neg UA pending  Blood cultures obtained  EKG with NSR, RBBB, LVH, Inferior infarct,  old Probable anterior infarct, age indeterminate   CXR with no acute cardiopulmonary disease. Aortic Atherosclerosis  CT head with no acute intracranial abnormality  CT abd/pelvis w/o contrast with cholelithiasis. Mild right middle lobe subsegmental atelectasis or infiltrate is noted. Aortic Atherosclerosis (ICD10-I70.0)  With elevated lfts, jaundice, fever  3:29 CONSULT with Dr. Purnell Shoemaker with [facility] gastroenterology who recommends MRCP w/o contrast given renal function. Recommends admission to hospitalist service. She will come see the patient.   3:54 PM CONSULT with hospitalist service who accepts patient for admission.   Final Clinical Impression(s) / ED Diagnoses Final diagnoses:  Sepsis, due to unspecified organism, unspecified whether acute organ dysfunction present [facility])  Transaminitis      Rx / DC Orders ED Discharge Orders    None       Rodney Booze, PA-C 05/28/19 1556    Pattricia Boss, MD 05/29/19 1524

## 2019-05-28 NOTE — ED Notes (Signed)
Scanner reported to NS to call for repair

## 2019-05-28 NOTE — ED Notes (Signed)
Attempted to get bloodwork and 2nd set of cultures in left ac, had great blood flash, but did not have any blood flow thru tubing of Kurin device.  Will attempt later after fluid bolus is complete.

## 2019-05-28 NOTE — ED Triage Notes (Signed)
States sugars  Running in 400s since yesterday c/o sob also  Some n/v/d

## 2019-05-28 NOTE — Progress Notes (Signed)
Patient unable to complete MRCP, was not following breathing commands and was yelling throughout exam.  Patient was screaming "help me" throughout the last sequence scanned and refused to continue exam.

## 2019-05-28 NOTE — ED Notes (Signed)
Messaged Dr Wyonia Hough regarding cbg of 445.

## 2019-05-28 NOTE — H&P (Addendum)
History and Physical    Susan [name] JOA:[id number] DOB: March 05, 1951 DOA: 05/28/2019  PCP: Alycia Rossetti, MD  Patient coming from: home  I have personally briefly reviewed patient's old medical records in [place]  Chief Complaint: " I feel lousy"  HPI: Susan [name] is a 69 y.o. female with medical history obtained from medical record secondary patient's encephalopathy in the ER.  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  Patient appears to be altered unable to find much meaningful history.  When asked if she had fevers she reported yes and her temperature was 305.  Was provided additional information to the ER indicating that her blood sugar has been mild to moderately elevated and she has been confused for 1 to 2 days.  I was unable to obtain any meaningful information from the patient other than she has some abdominal pain primarily located in the epigastric region she was unable to provide a severity or quantify it simply reporting it was hurting.  She did report the ongoing for the last several days which was confirmed by her husband did not report any significant changes in dietary intake, travel, or infectious exposures.  ED Course: In the ER her BMP is an obstructive process showing an alk phos of 567 AST of 450 ALT of 363 and a total bili of 6.6.  These are up from AST and ALT of 15 and a total bili of 0.4 December 28.  Patient's white blood cell count is normal platelets 170 she was on the mild fever of 100.8 in the ER.  CT in the ER showed cholelithiasis without acute cholecystitis, and a mild right middle lobe atelectasis versus infiltrate.  The case was discussed with GI who recommended MRCP and  will see in consultation.  Review of Systems: As per HPI otherwise 10 point review of  systems done notable for nominal pain although the review of symptoms was limited secondary to patient's altered mental status all other reviewed and are negative Past Medical History:  Diagnosis Date  . Acute MI ([place]) 1999; 2007  . Anemia    hx  . Anginal pain ([place])   . Anxiety   . ARF (acute renal failure) ([place]) 06/2017   [facility]  . Arthritis    "generalized" (03/15/2014)  . CAD (coronary artery disease)    MI in 2000 - MI  2007 - treated bare metal stent (no nuclear since then as 9/11)  . Carotid artery disease ([place])   . CHF (congestive heart failure) ([place])   . Chronic diastolic heart failure (HCC)    a) ECHO (08/2013) EF 55-60% and RV function nl b) RHC (08/2013) RA 4, RV 30/5/7, PA 25/10 (16), PCWP 7, Fick CO/CI 6.3/2.7, PVR 1.5 WU, PA 61 and 66%  . Daily headache    "~ every other day; since I fell in June" (03/15/2014)  . Depression   . Dyslipidemia   . Dyspnea   . Exertional shortness of breath   . HTN (hypertension)   . Hypothyroidism   . Neuropathy   . Obesity   . Osteoarthritis   . Peripheral neuropathy   . PONV (postoperative nausea and vomiting)   . RBBB (right bundle branch block)    Old  . Stroke [facility])    mini strokes  . Syncope    likely due to  low blood sugar  . Tachycardia    Sinus tachycardia  . Type II diabetes mellitus (HCC)    Type II  . Urinary incontinence   . Venous insufficiency     Past Surgical History:  Procedure Laterality Date  . ABDOMINAL HYSTERECTOMY  1980's  . AMPUTATION Right 02/24/2018   Procedure: RIGHT FOOT GREAT TOE AND 2ND TOE AMPUTATION;  Surgeon: Newt Minion, MD;  Location: [place];  Service: Orthopedics;  Laterality: Right;  . AMPUTATION Right 04/30/2018   Procedure: RIGHT TRANSMETATARSAL AMPUTATION;  Surgeon: Newt Minion, MD;  Location: [place];  Service: Orthopedics;  Laterality: Right;  . CATARACT EXTRACTION, BILATERAL Bilateral ?2013  . COLONOSCOPY W/ POLYPECTOMY    . COLONOSCOPY WITH PROPOFOL N/A  03/13/2019   Procedure: COLONOSCOPY WITH PROPOFOL;  Surgeon: Jerene Bears, MD;  Location: [place];  Service: Gastroenterology;  Laterality: N/A;  . [place]; 2007   "1 + 1"  . ESOPHAGOGASTRODUODENOSCOPY (EGD) WITH PROPOFOL N/A 03/13/2019   Procedure: ESOPHAGOGASTRODUODENOSCOPY (EGD) WITH PROPOFOL;  Surgeon: Jerene Bears, MD;  Location: [facility] ENDOSCOPY;  Service: Gastroenterology;  Laterality: N/A;  . EYE SURGERY Bilateral    [place]  . HEMOSTASIS CLIP PLACEMENT  03/13/2019   Procedure: HEMOSTASIS CLIP PLACEMENT;  Surgeon: Jerene Bears, MD;  Location: [facility] ENDOSCOPY;  Service: Gastroenterology;;  . KNEE ARTHROSCOPY Left 10/25/2006  . POLYPECTOMY  03/13/2019   Procedure: POLYPECTOMY;  Surgeon: Jerene Bears, MD;  Location: [place];  Service: Gastroenterology;;  . RIGHT HEART CATH N/A 07/24/2017   Procedure: RIGHT HEART CATH;  Surgeon: Jolaine Artist, MD;  Location: [place] CV LAB;  Service: Cardiovascular;  Laterality: N/A;  . RIGHT HEART CATHETERIZATION N/A 09/22/2013   Procedure: RIGHT HEART CATH;  Surgeon: Jolaine Artist, MD;  Location: [facility] CATH LAB;  Service: Cardiovascular;  Laterality: N/A;  . SHOULDER ARTHROSCOPY WITH OPEN ROTATOR CUFF REPAIR Right 03/14/2014   Procedure: RIGHT SHOULDER ARTHROSCOPY WITH BICEPS RELEASE, OPEN SUBSCAPULA REPAIR, OPEN SUPRASPINATUS REPAIR.;  Surgeon: Meredith Pel, MD;  Location: [place];  Service: Orthopedics;  Laterality: Right;  . TOE AMPUTATION Right 02/24/2018   GREAT TOE AND 2ND TOE AMPUTATION  . TUBAL LIGATION  1970's     reports that she quit smoking about 21 years ago. Her smoking use included cigarettes. She has a 96.00 pack-year smoking history. She has never used smokeless tobacco. She reports previous alcohol use. She reports that she does not use drugs.  Allergies  Allergen Reactions  . Codeine Nausea And Vomiting    Family History  Problem Relation Age of Onset  . Heart attack  Mother 84     Prior to Admission medications   Medication Sig Start Date End Date Taking? Authorizing Provider  acetaminophen (TYLENOL) 500 MG tablet Take 1,000-1,500 mg by mouth every 6 (six) hours as needed for headache (pain).   Yes [provider]  allopurinol (ZYLOPRIM) 100 MG tablet Take 1 tablet (100 mg total) by mouth 2 (two) times daily. 10/14/18  Yes Newt Minion, MD  Ascorbic Acid (VITAMIN C PO) Take 1 tablet by mouth daily with supper.    Yes [provider]  aspirin EC 81 MG tablet Take 81 mg by mouth every morning.    Yes [provider]  carvedilol (COREG) 12.5 MG tablet TAKE 1 TABLET BY MOUTH TWICE A DAY WITH MEALS Patient taking differently: Take 12.5 mg by mouth 2 (two) times daily with a meal.  05/12/19  Yes [place], [name], MD  cholecalciferol (VITAMIN D) 1000 units tablet Take 1,000 Units by mouth daily with supper.    Yes [provider]  clopidogrel (PLAVIX) 75 MG tablet TAKE 1 TABLET BY MOUTH EVERY DAY WITH BREAKFAST Patient taking differently: Take 75 mg by mouth daily.  05/11/19  Yes Clegg, Amy D, NP  colchicine 0.6 MG tablet Take 1 tablet (0.6 mg total) by mouth daily. As needed for gout flare 10/14/18  Yes Newt Minion, MD  escitalopram (LEXAPRO) 20 MG tablet TAKE 1 TABLET BY MOUTH EVERY DAY Patient taking differently: Take 20 mg by mouth daily.  05/11/19  Yes [name], MD  ferrous sulfate 325 (65 FE) MG tablet Take 1 tablet (325 mg total) by mouth 2 (two) times daily with a meal. Reported on 08/21/2015 06/07/18  Yes [place], [name], MD  Insulin Disposable Pump (OMNIPOD DASH 5 PACK PODS) MISC Inject into the skin See admin instructions. Use continuously with Novolin R - change every 72 hours. Manually add bolus to continuous dose via OmniPod 3 times daily per sliding scale: CBG 80-150 7 units, 151-200 9 units, 201-250 12 units, 251-300 14 units, 301-400 17 units. 11/18/18  Yes [provider]  insulin NPH Human  (HUMULIN N,NOVOLIN N) 100 UNIT/ML injection Inject 15 Units into the skin See admin instructions. Inject 15 units subcutaneously in the morning if CBG >300, inject 10 units if AM CBG 200-300   Yes [provider]  levothyroxine (SYNTHROID, LEVOTHROID) 50 MCG tablet Take 1 tablet (50 mcg total) by mouth daily before breakfast. 11/07/16  Yes Dena Billet B, PA-C  magnesium oxide (MAG-OX) 400 MG tablet TAKE 1 TABLET BY MOUTH EVERY DAY 09/17/18  Yes [place], [name], MD  metolazone (ZAROXOLYN) 2.5 MG tablet Take 1 tablet (2.5 mg total) by mouth as directed. Monday and Friday Patient taking differently: Take 2.5 mg by mouth See admin instructions. Take one tablet by mouth on Monday and Fridays 12/13/18  Yes Clegg, Amy D, NP  nitroGLYCERIN (NITROSTAT) 0.4 MG SL tablet Place 1 tablet (0.4 mg total) under the tongue every 5 (five) minutes as needed for chest pain. 04/28/19  Yes Clegg, Amy D, NP  ONETOUCH VERIO test strip  11/02/17  Yes [provider]  pantoprazole (PROTONIX) 40 MG tablet Take 1 tablet (40 mg total) by mouth daily. 04/28/19  Yes Clegg, Amy D, NP  pregabalin (LYRICA) 150 MG capsule Take 1 capsule (150 mg total) by mouth 2 (two) times daily. 11/12/16  Yes [place], [name], MD  PROAIR HFA 108 [phone] Base) MCG/ACT inhaler TAKE 2 PUFFS BY MOUTH EVERY 6 HOURS AS NEEDED FOR WHEEZE OR SHORTNESS OF BREATH Patient taking differently: Inhale 2 puffs into the lungs every 6 (six) hours as needed for shortness of breath.  01/11/19  Yes [place], [name], MD  rosuvastatin (CRESTOR) 20 MG tablet TAKE 1 TABLET BY MOUTH EVERY DAY Patient taking differently: Take 20 mg by mouth daily.  12/20/18  Yes [place], [name], MD  torsemide (DEMADEX) 20 MG tablet Take 3 tablets (60 mg total) by mouth 2 (two) times daily. 04/28/19  Yes Clegg, Amy D, NP  traZODone (DESYREL) 100 MG tablet TAKE 1 TABLET (100 MG TOTAL) BY MOUTH AT BEDTIME AS NEEDED FOR SLEEP. 05/16/19  Yes [place], [name], MD  vitamin E (VITAMIN E)  1000 UNIT capsule Take 1,000 Units by mouth daily with supper.    Yes [provider]    Physical  Exam: Vitals:   05/28/19 1129 05/28/19 1406  BP: 132/85   Pulse: 94   Resp: 18   Temp: (!) 97.4 F (36.3 C) (S) (!) 100.8 F (38.2 C)  TempSrc: Oral Rectal  SpO2: 95%     Constitutional: NAD, calm, comfortable Vitals:   05/28/19 1129 05/28/19 1406  BP: 132/85   Pulse: 94   Resp: 18   Temp: (!) 97.4 F (36.3 C) (S) (!) 100.8 F (38.2 C)  TempSrc: Oral Rectal  SpO2: 95%    Eyes: PERRL, lids and conjunctivae normal ENMT: Mucous membranes are moist. Posterior pharynx clear of any exudate or lesions.Normal dentition.  Neck: normal, supple, no masses, no thyromegaly Respiratory: Mild rhonchi bilaterally, no accessory muscle use no wheezing noted.  Cardiovascular: Sinus tach low 100s, no murmurs noted although limited exam given patient's body habitus and inability to comply with exam Abdomen: Mild epigastric tenderness no rebound no guarding not rigid distant bowel sounds Musculoskeletal: no clubbing / cyanosis. No joint deformity upper and lower extremities. Good ROM, no contractures. Normal muscle tone.  Skin: no rashes, lesions, ulcers. No induration Neurologic: Confused but no focal neurological deficits noted  psychiatric: Impaired judgment and insight. Alert. .     Labs on Admission: I have personally reviewed following labs and imaging studies  CBC: Recent Labs  Lab 05/23/19 1448 05/28/19 1151 05/28/19 1405  WBC 6.7 8.6  --   NEUTROABS 4,965  --   --   HGB 11.7 13.6 14.3  HCT 37.2 43.1 42.0  MCV 82.7 81.3  --   PLT 228 170  --    Basic Metabolic Panel: Recent Labs  Lab 05/23/19 1448 05/28/19 1151 05/28/19 1405  NA 139 135 134*  K 3.8 3.2* 4.4  CL 103 97*  --   CO2 24 25  --   GLUCOSE 395* 450*  --   BUN 36* 42*  --   CREATININE 1.95* 2.41*  --   CALCIUM 8.7 8.8*  --    GFR: Estimated Creatinine Clearance: 29.2 mL/min (A) (by C-G formula  based on SCr of 2.41 mg/dL (H)). Liver Function Tests: Recent Labs  Lab 05/23/19 1448 05/28/19 1151  AST 15 450*  ALT 15 363*  ALKPHOS  --  567*  BILITOT 0.4 6.6*  PROT 5.6* 6.0*  ALBUMIN  --  2.6*   Recent Labs  Lab 05/28/19 1151  LIPASE 30   No results for input(s): AMMONIA in the last 168 hours. Coagulation Profile: Recent Labs  Lab 05/28/19 1330  INR 1.1   Cardiac Enzymes: No results for input(s): CKTOTAL, CKMB, CKMBINDEX, TROPONINI in the last 168 hours. BNP (last 3 results) No results for input(s): PROBNP in the last 8760 hours. HbA1C: No results for input(s): HGBA1C in the last 72 hours. CBG: Recent Labs  Lab 05/28/19 1311  GLUCAP 414*   Lipid Profile: No results for input(s): CHOL, HDL, LDLCALC, TRIG, CHOLHDL, LDLDIRECT in the last 72 hours. Thyroid Function Tests: No results for input(s): TSH, T4TOTAL, FREET4, T3FREE, THYROIDAB in the last 72 hours. Anemia Panel: No results for input(s): VITAMINB12, FOLATE, FERRITIN, TIBC, IRON, RETICCTPCT in the last 72 hours. Urine analysis:    Component Value Date/Time   COLORURINE STRAW (A) 03/11/2019 1339   APPEARANCEUR CLEAR 03/11/2019 1339   LABSPEC 1.008 03/11/2019 1339   PHURINE 5.0 03/11/2019 1339   GLUCOSEU >=500 (A) 03/11/2019 1339   HGBUR LARGE (A) 03/11/2019 [place] 03/11/2019 1339   [place] 03/11/2019 1339  PROTEINUR 30 (A) 03/11/2019 1339   UROBILINOGEN 0.2 04/01/2014 1659   NITRITE NEGATIVE 03/11/2019 1339   LEUKOCYTESUR NEGATIVE 03/11/2019 1339    Radiological Exams on Admission: CT ABDOMEN PELVIS WO CONTRAST  Result Date: 05/28/2019 CLINICAL DATA:  Elevated liver function test, generalized abdominal pain. EXAM: CT ABDOMEN AND PELVIS WITHOUT CONTRAST TECHNIQUE: Multidetector CT imaging of the abdomen and pelvis was performed following the standard protocol without IV contrast. COMPARISON:  March 11, 2019. FINDINGS: Lower chest: Mild right middle lobe  subsegmental atelectasis or infiltrate is noted. Hepatobiliary: Mild cholelithiasis is noted. No biliary dilatation is noted. No focal abnormality is seen in the liver on these unenhanced images. Pancreas: Unremarkable. No pancreatic ductal dilatation or surrounding inflammatory changes. Spleen: Calcified splenic granulomata are noted. Adrenals/Urinary Tract: Adrenal glands are unremarkable. Kidneys are normal, without renal calculi, focal lesion, or hydronephrosis. Bladder is unremarkable. Stomach/Bowel: Stomach is within normal limits. Appendix appears normal. No evidence of bowel wall thickening, distention, or inflammatory changes. Vascular/Lymphatic: Aortic atherosclerosis. No enlarged abdominal or pelvic lymph nodes. Reproductive: Prostate is unremarkable. Other: No abdominal wall hernia or abnormality. No abdominopelvic ascites. Musculoskeletal: No acute or significant osseous findings. IMPRESSION: Cholelithiasis. Mild right middle lobe subsegmental atelectasis or infiltrate is noted. Aortic Atherosclerosis (ICD10-I70.0). Electronically Signed   By: Marijo Conception M.D.   On: 05/28/2019 14:50   DG Chest 2 View  Result Date: 05/28/2019 CLINICAL DATA:  Shortness of breath. Elevated blood sugar. Hypertension. CHF. EXAM: CHEST - 2 VIEW COMPARISON:  03/11/2019 FINDINGS: Minimally motion degraded lateral view. Patient rotated left on the frontal radiograph. Borderline cardiomegaly. Atherosclerosis in the transverse aorta. No pleural effusion or pneumothorax. No congestive failure. Clear lungs. IMPRESSION: No acute cardiopulmonary disease. Aortic Atherosclerosis (ICD10-I70.0). Electronically Signed   By: Abigail Miyamoto M.D.   On: 05/28/2019 12:15   CT Head Wo Contrast  Result Date: 05/28/2019 CLINICAL DATA:  Hyperglycemia, encephalopathy EXAM: CT HEAD WITHOUT CONTRAST TECHNIQUE: Contiguous axial images were obtained from the base of the skull through the vertex without intravenous contrast. COMPARISON:   10/25/2015 FINDINGS: Brain: Brain atrophy and chronic white matter microvascular ischemic changes throughout both cerebral hemispheres. No acute intracranial hemorrhage, new mass lesion, acute infarction, midline shift, herniation, hydrocephalus, or extra-axial fluid collection. No focal mass effect or edema. Cisterns are patent. Cerebellar atrophy as well. Vascular: No hyperdense vessel or unexpected calcification. Skull: Normal. Negative for fracture or focal lesion. Sinuses/Orbits: Minor maxillary mucosal thickening. No sinus air-fluid level or significant opacification. Orbits are symmetric. No acute finding. Other: None. IMPRESSION: Stable atrophy and chronic white matter microvascular ischemic changes. No acute intracranial abnormality by noncontrast CT. Electronically Signed   By: Jerilynn Mages.  Shick M.D.   On: 05/28/2019 14:46    EKG: Independently reviewed.  Prolonged QTC, right bundle branch block  Assessment/Plan Principal Problem:   Common bile duct (CBD) obstruction Active Problems:   Hyperlipemia   Morbid obesity (HCC)   Chronic diastolic CHF (congestive heart failure) (HCC)   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 mellitus (HCC)   PVD (peripheral vascular disease) (HCC)     CBD obstruction with metabolic encephalopathy Possible asc cholangitis: Broad-spectrum antibiotics to include cefepime and Flagyl, patient also received vancomycin MRCP Follow CBC, CMP and blood cultures GI is aware ER discussed with  [place] [facility] who will see in consultation and recommended MRCP as above Check an ammonia level  Chronic diastolic heart failure: Patient be hydrated overnight but cautiously monitoring ins and outs, daily weights  Diabetes: Place patient  on sliding scale insulin check blood glucose AC at bedtime Patient is n.p.o.  Hyperlipidemia: Patient can resume antihyperlipidemics on discharge Med rec not complete at the time of this encounter  CKD stage IV As above monitor  electrolytes Monitor renal function Does avoid nephrotoxic agents as possible patient received 1 dose of vancomycin in the ER and pharmacy will follow in consultation  Morbid obesity: Patient would benefit from counseling on weight loss Will defer to patient's mental status is improved  DVT prophylaxis: Heparin SQ  Code Status: full Code Status History    Date Active Date Inactive Code Status Order ID Comments User Context   03/11/2019 1427 03/14/2019 2128 Full Code [id number]  Norval Morton, MD ED   03/19/2018 0001 03/21/2018 1732 Full Code [id number]  Elwyn Reach, MD Inpatient   03/06/2018 0211 03/11/2018 2207 Full Code [id number]  Rise Patience, MD Inpatient   02/24/2018 1236 02/26/2018 1428 Full Code [id number]  Newt Minion, MD Inpatient   02/13/2018 2009 02/14/2018 2008 Full Code [id number]  Elwyn Reach, MD Inpatient   07/23/2017 0208 07/28/2017 2129 Full Code [id number]  Jani Gravel, MD ED   07/18/2017 0900 07/19/2017 2152 Full Code [id number]  Debbe Odea, MD Inpatient   07/16/2017 2004 07/18/2017 0900 DNR [id number]  Rise Patience, MD Inpatient   07/15/2017 2243 07/16/2017 2004 Full Code [id number]  Rise Patience, MD ED   10/25/2015 2018 10/28/2015 2000 DNR [id number]  [name], DO ED   07/10/2015 0004 07/12/2015 1947 Full Code [id number]  Toy Baker, MD Inpatient   06/07/2015 1818 06/10/2015 1739 Full Code [id number]  Elgergawy, Silver Huguenin, MD Inpatient   03/30/2014 2056 04/02/2014 2053 Full Code [id number]  Berle Mull, MD ED   03/14/2014 2007 03/15/2014 1950 Full Code [id number]  Meredith Pel, MD Inpatient   09/22/2013 1701 09/23/2013 1718 Full Code [id number]  Jolaine Artist, MD Inpatient   09/20/2013 1150 09/22/2013 1701 Full Code [id number]  Conrad [place], NP Inpatient   12/25/2012 1735 12/27/2012 1742 Full Code [id number]  Monika Salk, MD Inpatient   Advance Care Planning Activity     Family Communication: None at bedside Disposition Plan:   Patient  remained inpatient for IV antibiotics, further subspecialty evaluation for possible CBD obstruction. Consults called: GI, ER spoke with Dr.Mandingar Admission status: Inpatient   Nicolette Bang MD [facility]   If 7PM-7AM, please contact night-coverage   05/28/2019, 4:32 PM

## 2019-05-28 NOTE — Progress Notes (Signed)
Pharmacy Antibiotic Note  Susan [name] is a 69 y.o. female admitted on 05/28/2019 with sepsis.  Pharmacy has been consulted for vancomycin and cefepime dosing. Pt with low grade fever of 100.8 and WBC is WNL. Scr is elevated above baseline at 2.41 and lactic acid is <2.   Plan: Vancomycin 2gm IV x 1 then 1250mg  IV Q48H Cefepime 2gm IV Q24H F/u renal fxn, C&S, clinical status and peak/trough at SS    Temp (24hrs), Avg:99.1 F (37.3 C), Min:97.4 F (36.3 C), Max:100.8 F (38.2 C)  Recent Labs  Lab 05/23/19 1448 05/28/19 1151 05/28/19 1330  WBC 6.7 8.6  --   CREATININE 1.95* 2.41*  --   LATICACIDVEN  --   --  1.9    Estimated Creatinine Clearance: 28 mL/min (A) (by C-G formula based on SCr of 2.41 mg/dL (H)).    Allergies  Allergen Reactions  . Codeine Nausea And Vomiting    Antimicrobials this admission: Vanc 1/2>> Cefepime 1/2>> Flagyl 1/2>>  Dose adjustments this admission: N/A  Microbiology results: Pending  Thank you for allowing pharmacy to be a part of this patient's care.  Truc Winfree, Rande Lawman 05/28/2019 3:57 PM

## 2019-05-29 DIAGNOSIS — R509 Fever, unspecified: Secondary | ICD-10-CM

## 2019-05-29 DIAGNOSIS — R6510 Systemic inflammatory response syndrome (SIRS) of non-infectious origin without acute organ dysfunction: Secondary | ICD-10-CM

## 2019-05-29 DIAGNOSIS — E1122 Type 2 diabetes mellitus with diabetic chronic kidney disease: Secondary | ICD-10-CM

## 2019-05-29 DIAGNOSIS — R945 Abnormal results of liver function studies: Secondary | ICD-10-CM

## 2019-05-29 DIAGNOSIS — N184 Chronic kidney disease, stage 4 (severe): Secondary | ICD-10-CM

## 2019-05-29 LAB — CBC WITH DIFFERENTIAL/PLATELET
Abs Immature Granulocytes: 0.02 10*3/uL (ref 0.00–0.07)
Basophils Absolute: 0 10*3/uL (ref 0.0–0.1)
Basophils Relative: 0 %
Eosinophils Absolute: 0 10*3/uL (ref 0.0–0.5)
Eosinophils Relative: 0 %
HCT: 36.5 % (ref 36.0–46.0)
Hemoglobin: 11.5 g/dL — ABNORMAL LOW (ref 12.0–15.0)
Immature Granulocytes: 0 %
Lymphocytes Relative: 5 %
Lymphs Abs: 0.3 10*3/uL — ABNORMAL LOW (ref 0.7–4.0)
MCH: 25.7 pg — ABNORMAL LOW (ref 26.0–34.0)
MCHC: 31.5 g/dL (ref 30.0–36.0)
MCV: 81.5 fL (ref 80.0–100.0)
Monocytes Absolute: 0.5 10*3/uL (ref 0.1–1.0)
Monocytes Relative: 9 %
Neutro Abs: 4.7 10*3/uL (ref 1.7–7.7)
Neutrophils Relative %: 86 %
Platelets: 143 10*3/uL — ABNORMAL LOW (ref 150–400)
RBC: 4.48 MIL/uL (ref 3.87–5.11)
RDW: 14.5 % (ref 11.5–15.5)
WBC: 5.5 10*3/uL (ref 4.0–10.5)
nRBC: 0 % (ref 0.0–0.2)

## 2019-05-29 LAB — AMMONIA: Ammonia: 51 umol/L — ABNORMAL HIGH (ref 9–35)

## 2019-05-29 LAB — GLUCOSE, CAPILLARY
Glucose-Capillary: 169 mg/dL — ABNORMAL HIGH (ref 70–99)
Glucose-Capillary: 202 mg/dL — ABNORMAL HIGH (ref 70–99)
Glucose-Capillary: 284 mg/dL — ABNORMAL HIGH (ref 70–99)
Glucose-Capillary: 361 mg/dL — ABNORMAL HIGH (ref 70–99)
Glucose-Capillary: 378 mg/dL — ABNORMAL HIGH (ref 70–99)

## 2019-05-29 LAB — COMPREHENSIVE METABOLIC PANEL
ALT: 251 U/L — ABNORMAL HIGH (ref 0–44)
AST: 225 U/L — ABNORMAL HIGH (ref 15–41)
Albumin: 2 g/dL — ABNORMAL LOW (ref 3.5–5.0)
Alkaline Phosphatase: 554 U/L — ABNORMAL HIGH (ref 38–126)
Anion gap: 14 (ref 5–15)
BUN: 54 mg/dL — ABNORMAL HIGH (ref 8–23)
CO2: 18 mmol/L — ABNORMAL LOW (ref 22–32)
Calcium: 8.1 mg/dL — ABNORMAL LOW (ref 8.9–10.3)
Chloride: 104 mmol/L (ref 98–111)
Creatinine, Ser: 2.78 mg/dL — ABNORMAL HIGH (ref 0.44–1.00)
GFR calc Af Amer: 19 mL/min — ABNORMAL LOW (ref >60)
GFR calc non Af Amer: 17 mL/min — ABNORMAL LOW (ref >60)
Glucose, Bld: 410 mg/dL — ABNORMAL HIGH (ref 70–99)
Potassium: 3.5 mmol/L (ref 3.5–5.1)
Sodium: 136 mmol/L (ref 135–145)
Total Bilirubin: 5.4 mg/dL — ABNORMAL HIGH (ref 0.3–1.2)
Total Protein: 4.8 g/dL — ABNORMAL LOW (ref 6.5–8.1)

## 2019-05-29 LAB — HEPATIC FUNCTION PANEL
ALT: 240 U/L — ABNORMAL HIGH (ref 0–44)
AST: 208 U/L — ABNORMAL HIGH (ref 15–41)
Albumin: 2 g/dL — ABNORMAL LOW (ref 3.5–5.0)
Alkaline Phosphatase: 599 U/L — ABNORMAL HIGH (ref 38–126)
Bilirubin, Direct: 3.4 mg/dL — ABNORMAL HIGH (ref 0.0–0.2)
Indirect Bilirubin: 1.7 mg/dL — ABNORMAL HIGH (ref 0.3–0.9)
Total Bilirubin: 5.1 mg/dL — ABNORMAL HIGH (ref 0.3–1.2)
Total Protein: 5.2 g/dL — ABNORMAL LOW (ref 6.5–8.1)

## 2019-05-29 LAB — IRON AND TIBC
Iron: 20 ug/dL — ABNORMAL LOW (ref 28–170)
Saturation Ratios: 11 % (ref 10.4–31.8)
TIBC: 178 ug/dL — ABNORMAL LOW (ref 250–450)
UIBC: 158 ug/dL

## 2019-05-29 LAB — BILIRUBIN, DIRECT: Bilirubin, Direct: 3.9 mg/dL — ABNORMAL HIGH (ref 0.0–0.2)

## 2019-05-29 LAB — RETICULOCYTES
Immature Retic Fract: 10.5 % (ref 2.3–15.9)
RBC.: 4.55 MIL/uL (ref 3.87–5.11)
Retic Count, Absolute: 100.1 10*3/uL (ref 19.0–186.0)
Retic Ct Pct: 2.2 % (ref 0.4–3.1)

## 2019-05-29 LAB — FOLATE: Folate: 25.8 ng/mL (ref >5.9)

## 2019-05-29 LAB — VITAMIN B12: Vitamin B-12: 349 pg/mL (ref 180–914)

## 2019-05-29 LAB — FERRITIN: Ferritin: 359 ng/mL — ABNORMAL HIGH (ref 11–307)

## 2019-05-29 LAB — SARS CORONAVIRUS 2 (TAT 6-24 HRS): SARS Coronavirus 2: NEGATIVE

## 2019-05-29 LAB — GAMMA GT: GGT: 327 U/L — ABNORMAL HIGH (ref 7–50)

## 2019-05-29 MED ORDER — INSULIN ASPART 100 UNIT/ML ~~LOC~~ SOLN: 0.0000 [IU] | SUBCUTANEOUS | Status: DC

## 2019-05-29 MED ORDER — INSULIN GLARGINE 100 UNIT/ML ~~LOC~~ SOLN
15.0000 [IU] | Freq: Every day | SUBCUTANEOUS | Status: DC
  Administered 2019-05-29 – 2019-05-30 (×2): 15 [IU] via SUBCUTANEOUS
  Filled 2019-05-29 (×2): qty 0.15

## 2019-05-29 MED ORDER — INSULIN ASPART 100 UNIT/ML ~~LOC~~ SOLN
0.0000 [IU] | SUBCUTANEOUS | Status: DC
  Administered 2019-05-29: 11 [IU] via SUBCUTANEOUS
  Administered 2019-05-29: 20 [IU] via SUBCUTANEOUS
  Administered 2019-05-29: 7 [IU] via SUBCUTANEOUS
  Administered 2019-05-30: 3 [IU] via SUBCUTANEOUS
  Administered 2019-05-30: 11 [IU] via SUBCUTANEOUS
  Administered 2019-05-30: 7 [IU] via SUBCUTANEOUS
  Administered 2019-05-30: 20 [IU] via SUBCUTANEOUS
  Administered 2019-05-30: 3 [IU] via SUBCUTANEOUS
  Administered 2019-05-30: 15 [IU] via SUBCUTANEOUS
  Administered 2019-05-30: 1 [IU] via SUBCUTANEOUS
  Administered 2019-05-31: 20 [IU] via SUBCUTANEOUS
  Administered 2019-05-31: 7 [IU] via SUBCUTANEOUS
  Administered 2019-05-31: 4 [IU] via SUBCUTANEOUS
  Administered 2019-05-31: 20 [IU] via SUBCUTANEOUS

## 2019-05-29 MED ORDER — ALUM & MAG HYDROXIDE-SIMETH 200-200-20 MG/5ML PO SUSP
15.0000 mL | ORAL | Status: DC | PRN
  Administered 2019-05-29 (×2): 15 mL via ORAL
  Filled 2019-05-29 (×2): qty 30

## 2019-05-29 MED ORDER — KETOROLAC TROMETHAMINE 15 MG/ML IJ SOLN
15.0000 mg | Freq: Once | INTRAMUSCULAR | Status: AC
  Administered 2019-05-29: 15 mg via INTRAVENOUS
  Filled 2019-05-29: qty 1

## 2019-05-29 MED ORDER — ONDANSETRON HCL 4 MG/2ML IJ SOLN
4.0000 mg | Freq: Once | INTRAMUSCULAR | Status: AC
  Administered 2019-05-29: 4 mg via INTRAVENOUS
  Filled 2019-05-29: qty 2

## 2019-05-29 NOTE — Plan of Care (Signed)
  Problem: Health Behavior/Discharge Planning: Goal: Ability to manage health-related needs will improve Outcome: Progressing   

## 2019-05-29 NOTE — Progress Notes (Signed)
PROGRESS NOTE    Susan [name]  FTD:[id number] DOB: 29-Aug-1950 DOA: 05/28/2019 PCP: Alycia Rossetti, MD   Brief Narrative:  Per admitting H&P: Susan [name] is a 69 y.o. female with medical history obtained from medical record secondary patient's encephalopathy in the ER.  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  Patient appears to be altered unable to find much meaningful history.  When asked if she had fevers she reported yes and her temperature was 305.  Was provided additional information to the ER indicating that her blood sugar has been mild to moderately elevated and she has been confused for 1 to 2 days.  I was unable to obtain any meaningful information from the patient other than she has some abdominal pain primarily located in the epigastric region she was unable to provide a severity or quantify it simply reporting it was hurting.  She did report the ongoing for the last several days which was confirmed by her husband did not report any significant changes in dietary intake, travel, or infectious exposures.   Assessment & Plan:   Principal Problem:   Common bile duct (CBD) obstruction Active Problems:   Hyperlipemia   Morbid obesity (HCC)   Chronic diastolic CHF (congestive heart failure) ([place])   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 mellitus ([place])   PVD (peripheral vascular disease) ([place])   CBD obstruction with metabolic encephalopathy Possible asc cholangitis: Continue with broad-spectrum antibiotics to include cefepime and Flagyl, patient also received vancomycin MRCP limited but reviewed by GI Follow CBC, CMP and blood cultures GI following appreciate their input Numbers are declining, suspect CBD stone passed  Chronic diastolic heart failure: Patient be hydrated overnight but  cautiously monitoring ins and outs, daily weights  Diabetes: Place patient on sliding scale insulin check blood glucose AC at bedtime Can be started on diet[FL  Hyperlipidemia: Patient can resume antihyperlipidemics on discharge Med rec not complete at the time of this encounter Hold statins given patient's elevated LFTs  CKD stage IV As above monitor electrolytes Monitor renal function Does avoid nephrotoxic agents as possible patient received 1 dose of vancomycin in the ER and pharmacy will follow in consultation  Morbid obesity: Patient would benefit from counseling on weight loss Will defer to patient's mental status is improved   DVT prophylaxis: Heparin SQ  Code Status: FULL    Code Status Orders  (From admission, onward)         Start     Ordered   05/28/19 1745  Full code  Continuous     05/28/19 1744        Code Status History    Date Active Date Inactive Code Status Order ID Comments User Context   03/11/2019 1427 03/14/2019 2128 Full Code [id number]  Norval Morton, MD ED   03/19/2018 0001 03/21/2018 1732 Full Code [id number]  Elwyn Reach, MD Inpatient   03/06/2018 0211 03/11/2018 2207 Full Code [id number]  Rise Patience, MD Inpatient   02/24/2018 1236 02/26/2018 1428 Full Code [id number]  Newt Minion, MD Inpatient   02/13/2018 2009 02/14/2018 2008 Full Code [id number]  Elwyn Reach, MD Inpatient   07/23/2017 0208 07/28/2017 2129 Full Code [id number]  Jani Gravel, MD ED   07/18/2017 0900 07/19/2017 2152 Full Code [id number]  Debbe Odea, MD Inpatient   07/16/2017 2004 07/18/2017 0900 DNR  [id number]  Rise Patience, MD Inpatient   07/15/2017 2243 07/16/2017 2004 Full Code [id number]  Rise Patience, MD ED   10/25/2015 2018 10/28/2015 2000 DNR [id number]  [name], DO ED   07/10/2015 0004 07/12/2015 1947 Full Code [id number]  Toy Baker, MD Inpatient   06/07/2015 1818 06/10/2015 1739 Full Code [id number]  Elgergawy, Silver Huguenin, MD  Inpatient   03/30/2014 2056 04/02/2014 2053 Full Code [id number]  Berle Mull, MD ED   03/14/2014 2007 03/15/2014 1950 Full Code [id number]  Meredith Pel, MD Inpatient   09/22/2013 1701 09/23/2013 1718 Full Code [id number]  Jolaine Artist, MD Inpatient   09/20/2013 1150 09/22/2013 1701 Full Code [id number]  Conrad Lincoln, NP Inpatient   12/25/2012 1735 12/27/2012 1742 Full Code [id number]  Oti, Brantley Stage, MD Inpatient   Advance Care Planning Activity     Family Communication: NONE TODAY  Disposition Plan:   Patient remained inpatient for continued GI evaluation for liver disease versus CBD.  Patient will continue on IV antibiotics and additional day.  Patient not yet stable for discharge Consults called: None Admission status: Inpatient   Consultants:   GI  Procedures:  CT ABDOMEN PELVIS WO CONTRAST  Result Date: 05/28/2019 CLINICAL DATA:  Elevated liver function test, generalized abdominal pain. EXAM: CT ABDOMEN AND PELVIS WITHOUT CONTRAST TECHNIQUE: Multidetector CT imaging of the abdomen and pelvis was performed following the standard protocol without IV contrast. COMPARISON:  March 11, 2019. FINDINGS: Lower chest: Mild right middle lobe subsegmental atelectasis or infiltrate is noted. Hepatobiliary: Mild cholelithiasis is noted. No biliary dilatation is noted. No focal abnormality is seen in the liver on these unenhanced images. Pancreas: Unremarkable. No pancreatic ductal dilatation or surrounding inflammatory changes. Spleen: Calcified splenic granulomata are noted. Adrenals/Urinary Tract: Adrenal glands are unremarkable. Kidneys are normal, without renal calculi, focal lesion, or hydronephrosis. Bladder is unremarkable. Stomach/Bowel: Stomach is within normal limits. Appendix appears normal. No evidence of bowel wall thickening, distention, or inflammatory changes. Vascular/Lymphatic: Aortic atherosclerosis. No enlarged abdominal or pelvic lymph nodes. Reproductive: Prostate is unremarkable.  Other: No abdominal wall hernia or abnormality. No abdominopelvic ascites. Musculoskeletal: No acute or significant osseous findings. IMPRESSION: Cholelithiasis. Mild right middle lobe subsegmental atelectasis or infiltrate is noted. Aortic Atherosclerosis (ICD10-I70.0). Electronically Signed   By: Marijo Conception M.D.   On: 05/28/2019 14:50   DG Chest 2 View  Result Date: 05/28/2019 CLINICAL DATA:  Shortness of breath. Elevated blood sugar. Hypertension. CHF. EXAM: CHEST - 2 VIEW COMPARISON:  03/11/2019 FINDINGS: Minimally motion degraded lateral view. Patient rotated left on the frontal radiograph. Borderline cardiomegaly. Atherosclerosis in the transverse aorta. No pleural effusion or pneumothorax. No congestive failure. Clear lungs. IMPRESSION: No acute cardiopulmonary disease. Aortic Atherosclerosis (ICD10-I70.0). Electronically Signed   By: Abigail Miyamoto M.D.   On: 05/28/2019 12:15   CT Head Wo Contrast  Result Date: 05/28/2019 CLINICAL DATA:  Hyperglycemia, encephalopathy EXAM: CT HEAD WITHOUT CONTRAST TECHNIQUE: Contiguous axial images were obtained from the base of the skull through the vertex without intravenous contrast. COMPARISON:  10/25/2015 FINDINGS: Brain: Brain atrophy and chronic white matter microvascular ischemic changes throughout both cerebral hemispheres. No acute intracranial hemorrhage, new mass lesion, acute infarction, midline shift, herniation, hydrocephalus, or extra-axial fluid collection. No focal mass effect or edema. Cisterns are patent. Cerebellar atrophy as well. Vascular: No hyperdense vessel or unexpected calcification. Skull: Normal. Negative for fracture or focal lesion. Sinuses/Orbits: Minor maxillary mucosal thickening. No sinus air-fluid level or significant opacification. Orbits are symmetric.  No acute finding. Other: None. IMPRESSION: Stable atrophy and chronic white matter microvascular ischemic changes. No acute intracranial abnormality by noncontrast CT.  Electronically Signed   By: Jerilynn Mages.  Shick M.D.   On: 05/28/2019 14:46   MR ABDOMEN MRCP WO CONTRAST  Result Date: 05/28/2019 CLINICAL DATA:  Rule out CBD stone EXAM: MRI ABDOMEN WITHOUT CONTRAST  (INCLUDING MRCP) TECHNIQUE: Multiplanar multisequence MR imaging of the abdomen was performed. Heavily T2-weighted images of the biliary and pancreatic ducts were obtained, and three-dimensional MRCP images were rendered by post processing. COMPARISON:  CT abdomen pelvis, 05/27/2018 FINDINGS: Examination is generally limited by patient motion and breath motion artifact. By technologist report, patient was agitated and yelling in scanner and unable to complete exam. Lower chest: No acute findings. Hepatobiliary: No mass or other parenchymal abnormality identified. Multiple small gallstones in the dependent gallbladder. No biliary ductal dilatation or common bile duct calculus. Pancreas: No mass, inflammatory changes, or other parenchymal abnormality identified. Spleen:  Within normal limits in size and appearance. Adrenals/Urinary Tract: No masses identified. No evidence of hydronephrosis. Stomach/Bowel: Susceptibility artifact in the gastric body secondary to endoscopically placed clip. Visualized portions within the abdomen are unremarkable. Vascular/Lymphatic: No pathologically enlarged lymph nodes identified. No abdominal aortic aneurysm demonstrated. Other:  None. Musculoskeletal: No suspicious bone lesions identified. IMPRESSION: Examination is generally limited by patient motion and breath motion artifact. By technologist report, patient was agitated and yelling in scanner and unable to complete exam. Within this limitation, cholelithiasis without evidence of acute cholecystitis. No evidence of common bile duct calculus. No biliary ductal dilatation. Electronically Signed   By: Eddie Candle M.D.   On: 05/28/2019 20:02     Antimicrobials:   Cefepime and Flagyl   Subjective: No acute events  overnight  Objective: Vitals:   05/28/19 1745 05/28/19 2129 05/29/19 0500 05/29/19 0509  BP: (!) 149/63 (!) 114/44  (!) 137/96  Pulse: 91 84  85  Resp: 20 15  14   Temp: 98.2 F (36.8 C) (!) 97.4 F (36.3 C)  98.3 F (36.8 C)  TempSrc: Oral Oral  Oral  SpO2: 97% 94%  94%  Weight: 115.5 kg  119.8 kg   Height: 5\' 5"  (1.651 m)       Intake/Output Summary (Last 24 hours) at 05/29/2019 1518 Last data filed at 05/29/2019 1500 Gross per 24 hour  Intake 2688.03 ml  Output 950 ml  Net 1738.03 ml   Filed Weights   05/28/19 1745 05/29/19 0500  Weight: 115.5 kg 119.8 kg    Examination:  General exam: Appears calm and comfortable  Respiratory system: Clear to auscultation. Respiratory effort normal. Cardiovascular system: S1 & S2 heard, RRR. No JVD, murmurs, rubs, gallops or clicks. No pedal edema. Gastrointestinal system: Abdomen is nondistended, soft and nontender. No organomegaly or masses felt. Normal bowel sounds heard. Central nervous system: Alert and oriented. No focal neurological deficits. Extremities: Warm well perfused, no contractures. Skin: Less jaundiced Psychiatry: Judgement and insight appear normal. Mood & affect appropriate.     Data Reviewed: I have personally reviewed following labs and imaging studies  CBC: Recent Labs  Lab 05/23/19 1448 05/28/19 1151 05/28/19 1405 05/28/19 1808 05/29/19 0221  WBC 6.7 8.6  --  6.6 5.5  NEUTROABS 4,965  --   --   --  4.7  HGB 11.7 13.6 14.3 12.5 11.5*  HCT 37.2 43.1 42.0 40.1 36.5  MCV 82.7 81.3  --  83.0 81.5  PLT 228 170  --  149* 143*  Basic Metabolic Panel: Recent Labs  Lab 05/23/19 1448 05/28/19 1151 05/28/19 1405 05/28/19 1808 05/29/19 0221  NA 139 135 134*  --  136  K 3.8 3.2* 4.4  --  3.5  CL 103 97*  --   --  104  CO2 24 25  --   --  18*  GLUCOSE 395* 450*  --   --  410*  BUN 36* 42*  --   --  54*  CREATININE 1.95* 2.41*  --  2.70* 2.78*  CALCIUM 8.7 8.8*  --   --  8.1*   GFR: Estimated  Creatinine Clearance: 24.8 mL/min (A) (by C-G formula based on SCr of 2.78 mg/dL (H)). Liver Function Tests: Recent Labs  Lab 05/23/19 1448 05/28/19 1151 05/29/19 0221 05/29/19 1207  AST 15 450* 225* 208*  ALT 15 363* 251* 240*  ALKPHOS  --  567* 554* 599*  BILITOT 0.4 6.6* 5.4* 5.1*  PROT 5.6* 6.0* 4.8* 5.2*  ALBUMIN  --  2.6* 2.0* 2.0*   Recent Labs  Lab 05/28/19 1151  LIPASE 30   Recent Labs  Lab 05/29/19 0221  AMMONIA 51*   Coagulation Profile: Recent Labs  Lab 05/28/19 1330  INR 1.1   Cardiac Enzymes: No results for input(s): CKTOTAL, CKMB, CKMBINDEX, TROPONINI in the last 168 hours. BNP (last 3 results) No results for input(s): PROBNP in the last 8760 hours. HbA1C: No results for input(s): HGBA1C in the last 72 hours. CBG: Recent Labs  Lab 05/28/19 1311 05/28/19 1659 05/28/19 2123 05/29/19 0722 05/29/19 1218  GLUCAP 414* 445* 350* 378* 361*   Lipid Profile: No results for input(s): CHOL, HDL, LDLCALC, TRIG, CHOLHDL, LDLDIRECT in the last 72 hours. Thyroid Function Tests: No results for input(s): TSH, T4TOTAL, FREET4, T3FREE, THYROIDAB in the last 72 hours. Anemia Panel: Recent Labs    05/29/19 1207  VITAMINB12 349  FOLATE 25.8  FERRITIN 359*  TIBC 178*  IRON 20*  RETICCTPCT 2.2   Sepsis Labs: Recent Labs  Lab 05/28/19 1330  LATICACIDVEN 1.9    Recent Results (from the past 240 hour(s))  Blood Culture (routine x 2)     Status: None (Preliminary result)   Collection Time: 05/28/19  1:30 PM   Specimen: BLOOD RIGHT FOREARM  Result Value Ref Range Status   Specimen Description BLOOD RIGHT FOREARM  Final   Special Requests   Final    BOTTLES DRAWN AEROBIC AND ANAEROBIC Blood Culture adequate volume   Culture   Final    NO GROWTH < 24 HOURS Performed at [facility], [place] [address], [place], [place] [address]    Report Status PENDING  Incomplete  Blood Culture (routine x 2)     Status: None (Preliminary result)   Collection Time:  05/28/19  6:22 PM   Specimen: BLOOD LEFT HAND  Result Value Ref Range Status   Specimen Description BLOOD LEFT HAND  Final   Special Requests   Final    BOTTLES DRAWN AEROBIC ONLY Blood Culture results may not be optimal due to an inadequate volume of blood received in culture bottles   Culture   Final    NO GROWTH < 12 HOURS Performed at [facility], [place] [address] [place], [place] [address]    Report Status PENDING  Incomplete  Urine culture     Status: Abnormal (Preliminary result)   Collection Time: 05/28/19  6:31 PM   Specimen: In/Out Cath Urine  Result Value Ref Range Status  Specimen Description IN/OUT CATH URINE  Final   Special Requests NONE  Final   Culture (A)  Final    30,000 COLONIES/mL GRAM NEGATIVE RODS IDENTIFICATION AND SUSCEPTIBILITIES TO FOLLOW Performed at [facility], [address] [place], [place] [address]    Report Status PENDING  Incomplete  SARS CORONAVIRUS 2 (TAT 6-24 HRS) Nasopharyngeal Nasopharyngeal Swab     Status: None   Collection Time: 05/28/19  7:37 PM   Specimen: Nasopharyngeal Swab  Result Value Ref Range Status   SARS Coronavirus 2 NEGATIVE NEGATIVE Final    Comment: (NOTE) SARS-CoV-2 target nucleic acids are NOT DETECTED. The SARS-CoV-2 RNA is generally detectable in upper and lower respiratory specimens during the acute phase of infection. Negative results do not preclude SARS-CoV-2 infection, do not rule out co-infections with other pathogens, and should not be used as the sole basis for treatment or other patient management decisions. Negative results must be combined with clinical observations, patient history, and epidemiological information. The expected result is Negative. Fact Sheet for Patients: SugarRoll.be Fact Sheet for Healthcare Providers: https://www.woods-mathews.com/ This test is not yet approved or cleared by the [place] FDA and  has been authorized for  detection and/or diagnosis of SARS-CoV-2 by FDA under an Emergency Use Authorization (EUA). This EUA will remain  in effect (meaning this test can be used) for the duration of the COVID-19 declaration under Section 56 4(b)(1) of the Act, 21 U.S.C. section 360bbb-3(b)(1), unless the authorization is terminated or revoked sooner. Performed at [facility], [place] [address] [place], [place] [address]          Radiology Studies: CT ABDOMEN PELVIS WO CONTRAST  Result Date: 05/28/2019 CLINICAL DATA:  Elevated liver function test, generalized abdominal pain. EXAM: CT ABDOMEN AND PELVIS WITHOUT CONTRAST TECHNIQUE: Multidetector CT imaging of the abdomen and pelvis was performed following the standard protocol without IV contrast. COMPARISON:  March 11, 2019. FINDINGS: Lower chest: Mild right middle lobe subsegmental atelectasis or infiltrate is noted. Hepatobiliary: Mild cholelithiasis is noted. No biliary dilatation is noted. No focal abnormality is seen in the liver on these unenhanced images. Pancreas: Unremarkable. No pancreatic ductal dilatation or surrounding inflammatory changes. Spleen: Calcified splenic granulomata are noted. Adrenals/Urinary Tract: Adrenal glands are unremarkable. Kidneys are normal, without renal calculi, focal lesion, or hydronephrosis. Bladder is unremarkable. Stomach/Bowel: Stomach is within normal limits. Appendix appears normal. No evidence of bowel wall thickening, distention, or inflammatory changes. Vascular/Lymphatic: Aortic atherosclerosis. No enlarged abdominal or pelvic lymph nodes. Reproductive: Prostate is unremarkable. Other: No abdominal wall hernia or abnormality. No abdominopelvic ascites. Musculoskeletal: No acute or significant osseous findings. IMPRESSION: Cholelithiasis. Mild right middle lobe subsegmental atelectasis or infiltrate is noted. Aortic Atherosclerosis (ICD10-I70.0). Electronically Signed   By: Marijo Conception M.D.   On: 05/28/2019 14:50    DG Chest 2 View  Result Date: 05/28/2019 CLINICAL DATA:  Shortness of breath. Elevated blood sugar. Hypertension. CHF. EXAM: CHEST - 2 VIEW COMPARISON:  03/11/2019 FINDINGS: Minimally motion degraded lateral view. Patient rotated left on the frontal radiograph. Borderline cardiomegaly. Atherosclerosis in the transverse aorta. No pleural effusion or pneumothorax. No congestive failure. Clear lungs. IMPRESSION: No acute cardiopulmonary disease. Aortic Atherosclerosis (ICD10-I70.0). Electronically Signed   By: Abigail Miyamoto M.D.   On: 05/28/2019 12:15   CT Head Wo Contrast  Result Date: 05/28/2019 CLINICAL DATA:  Hyperglycemia, encephalopathy EXAM: CT HEAD WITHOUT CONTRAST TECHNIQUE: Contiguous axial images were obtained from the base of the skull through the vertex without  intravenous contrast. COMPARISON:  10/25/2015 FINDINGS: Brain: Brain atrophy and chronic white matter microvascular ischemic changes throughout both cerebral hemispheres. No acute intracranial hemorrhage, new mass lesion, acute infarction, midline shift, herniation, hydrocephalus, or extra-axial fluid collection. No focal mass effect or edema. Cisterns are patent. Cerebellar atrophy as well. Vascular: No hyperdense vessel or unexpected calcification. Skull: Normal. Negative for fracture or focal lesion. Sinuses/Orbits: Minor maxillary mucosal thickening. No sinus air-fluid level or significant opacification. Orbits are symmetric. No acute finding. Other: None. IMPRESSION: Stable atrophy and chronic white matter microvascular ischemic changes. No acute intracranial abnormality by noncontrast CT. Electronically Signed   By: Jerilynn Mages.  Shick M.D.   On: 05/28/2019 14:46   MR ABDOMEN MRCP WO CONTRAST  Result Date: 05/28/2019 CLINICAL DATA:  Rule out CBD stone EXAM: MRI ABDOMEN WITHOUT CONTRAST  (INCLUDING MRCP) TECHNIQUE: Multiplanar multisequence MR imaging of the abdomen was performed. Heavily T2-weighted images of the biliary and pancreatic ducts  were obtained, and three-dimensional MRCP images were rendered by post processing. COMPARISON:  CT abdomen pelvis, 05/27/2018 FINDINGS: Examination is generally limited by patient motion and breath motion artifact. By technologist report, patient was agitated and yelling in scanner and unable to complete exam. Lower chest: No acute findings. Hepatobiliary: No mass or other parenchymal abnormality identified. Multiple small gallstones in the dependent gallbladder. No biliary ductal dilatation or common bile duct calculus. Pancreas: No mass, inflammatory changes, or other parenchymal abnormality identified. Spleen:  Within normal limits in size and appearance. Adrenals/Urinary Tract: No masses identified. No evidence of hydronephrosis. Stomach/Bowel: Susceptibility artifact in the gastric body secondary to endoscopically placed clip. Visualized portions within the abdomen are unremarkable. Vascular/Lymphatic: No pathologically enlarged lymph nodes identified. No abdominal aortic aneurysm demonstrated. Other:  None. Musculoskeletal: No suspicious bone lesions identified. IMPRESSION: Examination is generally limited by patient motion and breath motion artifact. By technologist report, patient was agitated and yelling in scanner and unable to complete exam. Within this limitation, cholelithiasis without evidence of acute cholecystitis. No evidence of common bile duct calculus. No biliary ductal dilatation. Electronically Signed   By: Eddie Candle M.D.   On: 05/28/2019 20:02        Scheduled Meds:  heparin  5,000 Units Subcutaneous Q8H   insulin aspart  0-20 Units Subcutaneous Q4H   insulin glargine  15 Units Subcutaneous Daily   Continuous Infusions:  sodium chloride 125 mL/hr at 05/29/19 1136   ceFEPime (MAXIPIME) IV     metronidazole 500 mg (05/29/19 0858)   [START ON 05/30/2019] vancomycin       LOS: 1 day    Time spent: [place], MD [facility]  If  7PM-7AM, please contact night-coverage  05/29/2019, 3:18 PM

## 2019-05-29 NOTE — Consult Note (Addendum)
Referring Provider: Dr. Nicolette Bang Primary Care Physician:  Alycia Rossetti, MD Primary Gastroenterologist:  Dr. Collene Mares  Reason for Consultation:  Elevated LFTs  HPI: Susan [name] is a 69 y.o. female with a past medical history of arthritis, depression, HTN, CAD, MI 2000 and 2007 s/p 1 metal stent on Plavix, CHF, HTN, poorly controlled DM II, neuropathy, CVA, CKD stage IV and UGI bleed 02/2019. She is unable to tell me why she is in the hospital. Due to her encephalopathy, I obtained her history from medical records. Her history obtained by the ED physician assistant reported the patient complained of lower abdominal pain with N/V and diarrhea for the past 2 to 3 days. Her glucose levels have been poorly controlled. Her husband assessed that she was confused yesterday so he brought her to [facility] ED for further evaluation. Laboratory studies done in the ED showed elevated LFTs. AST 450. ALT 363. Alk phos 567 and T. Bili 6.6. An abd/pelvic CT WO contrast showed cholelithiasis without biliary ductal dilatation. An MRCP WO contrast was somewhat limited due to motion artifact and the patient became agitated during the MRCP, however, the results showed cholelithiasis without evidence of cholecystitis or choledocholithiasis. Currently, she complains of having nausea and epigastric pain. No vomiting. No heartburn or dysphagia. No BM since admission. She is unable to tell me when she last had a BM. She denies having any melena or rectal bleeding. She denies having any knowledge of past liver disease. She denies alcohol use. No drug use. She is a nonsmoker. History of CAD. She is on Plavix and ASA 11m daily. She does not recall when she last took Plavix. She denies having any chest pain or SOB. She has noticed her urine has been darker yellow. No family history of liver, stomach or colorectal cancer. She has a history of an UGI bleed with melena 02/2019. She underwent an EGD in the hospital by Dr. PHilarie Fredrickson 03/13/2019 which showed a large gastric hyperplastic polyp, most likely the source of her GI bleed. A colonoscopy was done on the same date, 6 TA polyps were removed, diverticulosis throughout the colon, no evidence of lower GI bleeding. She had  UTI 03/11/2019  + Klebsiella Pneumoniae and E. Coli treated with antibiotics.   ED Course: Na 135. K 3.2. Glu 450. BUN 42. Cr. 2.41 (1.95 on 12/28). Alk phos 567. AST 450. ALT 363. T. Bili 6.6. Lipase 30. BNP 586.5. WBC 8.6. Hg 13.6. HCT 43.1. PLT 170. Acute hepatitis panel negative. Urinalysis: Hg lg, leukocytes moderate, glu > 500 and protein > 300.  SARS Coronavirus 2 negative.   Abdominal/pelvic CT  WO contrast 05/27/2018:  Cholelithiasis. Mild right middle lobe subsegmental atelectasis or infiltrate is noted. Calcified splenic granulomata.  Aortic Atherosclerosis   CT Head WO contrast 05/27/2018: Stable atrophy and chronic white matter microvascular ischemic changes. No acute intracranial abnormality by noncontrast CT.  MRCP 05/27/2018:  Examination is generally limited by patient motion and breath motion artifact. By technologist report, patient was agitated and yelling in scanner and unable to complete exam. Within this limitation, cholelithiasis without evidence of acute cholecystitis. No evidence of common bile duct calculus. No biliary ductal dilatation.  EGD 03/13/2019 by Dr. PHilarie Fredricksondone due to GIB/melena.  Normal esophagus.  A single, large, gastric body polyp. Resected and retrieved. Clips (MR conditional) were placed. Given the size of this polyp, this is most probable source of melena. Biopsies showed a hyperplastic polyp. A single, small, gastric antrum polyp. Gastritis. Normal  examined duodenum.  Colonoscopy 03/13/2019:  - Two 4 to 5 mm polyps in the ascending colon. Clip was placed. - Two 3 to 4 mm polyps in the transverse colon, removed with a cold snare. Resected and retrieved. - One 4 mm polyp in the descending colon,  removed with a cold snare. Resected and retrieved. - One 4 mm polyp in the sigmoid colon, removed with a cold snare. Complete resection. Polyp tissue not retrieved. - Moderate diverticulosis in the sigmoid colon, in the descending colon, in the transverse colon and at the hepatic flexure. - Internal hemorrhoids.  Colonoscopy 2011 by Dr. Collene Mares:  Inflammatory polyp in the rectum and pandiverticulosis.   Past Medical History:  Diagnosis Date  . Acute MI ([place]) 1999; 2007  . Anemia    hx  . Anginal pain ([place])   . Anxiety   . ARF (acute renal failure) ([place]) 06/2017   [facility] Asso  . Arthritis    "generalized" (03/15/2014)  . CAD (coronary artery disease)    MI in 2000 - MI  2007 - treated bare metal stent (no nuclear since then as 9/11)  . Carotid artery disease ([place])   . CHF (congestive heart failure) ([place])   . Chronic diastolic heart failure (HCC)    a) ECHO (08/2013) EF 55-60% and RV function nl b) RHC (08/2013) RA 4, RV 30/5/7, PA 25/10 (16), PCWP 7, Fick CO/CI 6.3/2.7, PVR 1.5 WU, PA 61 and 66%  . Daily headache    "~ every other day; since I fell in June" (03/15/2014)  . Depression   . Dyslipidemia   . Dyspnea   . Exertional shortness of breath   . HTN (hypertension)   . Hypothyroidism   . Neuropathy   . Obesity   . Osteoarthritis   . Peripheral neuropathy   . PONV (postoperative nausea and vomiting)   . RBBB (right bundle branch block)    Old  . Stroke [facility])    mini strokes  . Syncope    likely due to low blood sugar  . Tachycardia    Sinus tachycardia  . Type II diabetes mellitus (HCC)    Type II  . Urinary incontinence   . Venous insufficiency     Past Surgical History:  Procedure Laterality Date  . ABDOMINAL HYSTERECTOMY  1980's  . AMPUTATION Right 02/24/2018   Procedure: RIGHT FOOT GREAT TOE AND 2ND TOE AMPUTATION;  Surgeon: Newt Minion, MD;  Location: [place];  Service: Orthopedics;  Laterality: Right;  . AMPUTATION Right 04/30/2018   Procedure:  RIGHT TRANSMETATARSAL AMPUTATION;  Surgeon: Newt Minion, MD;  Location: [place];  Service: Orthopedics;  Laterality: Right;  . CATARACT EXTRACTION, BILATERAL Bilateral ?2013  . COLONOSCOPY W/ POLYPECTOMY    . COLONOSCOPY WITH PROPOFOL N/A 03/13/2019   Procedure: COLONOSCOPY WITH PROPOFOL;  Surgeon: Jerene Bears, MD;  Location: [place];  Service: Gastroenterology;  Laterality: N/A;  . [place]; 2007   "1 + 1"  . ESOPHAGOGASTRODUODENOSCOPY (EGD) WITH PROPOFOL N/A 03/13/2019   Procedure: ESOPHAGOGASTRODUODENOSCOPY (EGD) WITH PROPOFOL;  Surgeon: Jerene Bears, MD;  Location: [facility] ENDOSCOPY;  Service: Gastroenterology;  Laterality: N/A;  . EYE SURGERY Bilateral    lazer  . HEMOSTASIS CLIP PLACEMENT  03/13/2019   Procedure: HEMOSTASIS CLIP PLACEMENT;  Surgeon: Jerene Bears, MD;  Location: [facility] ENDOSCOPY;  Service: Gastroenterology;;  . KNEE ARTHROSCOPY Left 10/25/2006  . POLYPECTOMY  03/13/2019   Procedure: POLYPECTOMY;  Surgeon: Hilarie Fredrickson,  Lajuan Lines, MD;  Location: [facility] ENDOSCOPY;  Service: Gastroenterology;;  . RIGHT HEART CATH N/A 07/24/2017   Procedure: RIGHT HEART CATH;  Surgeon: Jolaine Artist, MD;  Location: [place] CV LAB;  Service: Cardiovascular;  Laterality: N/A;  . RIGHT HEART CATHETERIZATION N/A 09/22/2013   Procedure: RIGHT HEART CATH;  Surgeon: Jolaine Artist, MD;  Location: [facility] CATH LAB;  Service: Cardiovascular;  Laterality: N/A;  . SHOULDER ARTHROSCOPY WITH OPEN ROTATOR CUFF REPAIR Right 03/14/2014   Procedure: RIGHT SHOULDER ARTHROSCOPY WITH BICEPS RELEASE, OPEN SUBSCAPULA REPAIR, OPEN SUPRASPINATUS REPAIR.;  Surgeon: Meredith Pel, MD;  Location: [place];  Service: Orthopedics;  Laterality: Right;  . TOE AMPUTATION Right 02/24/2018   GREAT TOE AND 2ND TOE AMPUTATION  . TUBAL LIGATION  1970's    Prior to Admission medications   Medication Sig Start Date End Date Taking? Authorizing Provider  acetaminophen (TYLENOL) 500 MG tablet  Take 1,000-1,500 mg by mouth every 6 (six) hours as needed for headache (pain).   Yes [provider]  allopurinol (ZYLOPRIM) 100 MG tablet Take 1 tablet (100 mg total) by mouth 2 (two) times daily. 10/14/18  Yes Newt Minion, MD  Ascorbic Acid (VITAMIN C PO) Take 1 tablet by mouth daily with supper.    Yes [provider]  aspirin EC 81 MG tablet Take 81 mg by mouth every morning.    Yes [provider]  carvedilol (COREG) 12.5 MG tablet TAKE 1 TABLET BY MOUTH TWICE A DAY WITH MEALS Patient taking differently: Take 12.5 mg by mouth 2 (two) times daily with a meal.  05/12/19  Yes [place], [name], MD  cholecalciferol (VITAMIN D) 1000 units tablet Take 1,000 Units by mouth daily with supper.    Yes [provider]  clopidogrel (PLAVIX) 75 MG tablet TAKE 1 TABLET BY MOUTH EVERY DAY WITH BREAKFAST Patient taking differently: Take 75 mg by mouth daily.  05/11/19  Yes Clegg, Amy D, NP  colchicine 0.6 MG tablet Take 1 tablet (0.6 mg total) by mouth daily. As needed for gout flare 10/14/18  Yes Newt Minion, MD  escitalopram (LEXAPRO) 20 MG tablet TAKE 1 TABLET BY MOUTH EVERY DAY Patient taking differently: Take 20 mg by mouth daily.  05/11/19  Yes [name], MD  ferrous sulfate 325 (65 FE) MG tablet Take 1 tablet (325 mg total) by mouth 2 (two) times daily with a meal. Reported on 08/21/2015 06/07/18  Yes [place], [name], MD  Insulin Disposable Pump (OMNIPOD DASH 5 PACK PODS) MISC Inject into the skin See admin instructions. Use continuously with Novolin R - change every 72 hours. Manually add bolus to continuous dose via OmniPod 3 times daily per sliding scale: CBG 80-150 7 units, 151-200 9 units, 201-250 12 units, 251-300 14 units, 301-400 17 units. 11/18/18  Yes [provider]  insulin NPH Human (HUMULIN N,NOVOLIN N) 100 UNIT/ML injection Inject 15 Units into the skin See admin instructions. Inject 15 units subcutaneously in the morning if CBG >300,  inject 10 units if AM CBG 200-300   Yes [provider]  levothyroxine (SYNTHROID, LEVOTHROID) 50 MCG tablet Take 1 tablet (50 mcg total) by mouth daily before breakfast. 11/07/16  Yes Dena Billet B, PA-C  magnesium oxide (MAG-OX) 400 MG tablet TAKE 1 TABLET BY MOUTH EVERY DAY 09/17/18  Yes [place], [name], MD  metolazone (ZAROXOLYN) 2.5 MG tablet Take 1 tablet (2.5 mg total) by mouth as directed. Monday and Friday Patient taking differently:  Take 2.5 mg by mouth See admin instructions. Take one tablet by mouth on Monday and Fridays 12/13/18  Yes Clegg, Amy D, NP  nitroGLYCERIN (NITROSTAT) 0.4 MG SL tablet Place 1 tablet (0.4 mg total) under the tongue every 5 (five) minutes as needed for chest pain. 04/28/19  Yes Clegg, Amy D, NP  ONETOUCH VERIO test strip  11/02/17  Yes [provider]  pantoprazole (PROTONIX) 40 MG tablet Take 1 tablet (40 mg total) by mouth daily. 04/28/19  Yes Clegg, Amy D, NP  pregabalin (LYRICA) 150 MG capsule Take 1 capsule (150 mg total) by mouth 2 (two) times daily. 11/12/16  Yes [place], [name], MD  PROAIR HFA 108 [phone] Base) MCG/ACT inhaler TAKE 2 PUFFS BY MOUTH EVERY 6 HOURS AS NEEDED FOR WHEEZE OR SHORTNESS OF BREATH Patient taking differently: Inhale 2 puffs into the lungs every 6 (six) hours as needed for shortness of breath.  01/11/19  Yes [place], [name], MD  rosuvastatin (CRESTOR) 20 MG tablet TAKE 1 TABLET BY MOUTH EVERY DAY Patient taking differently: Take 20 mg by mouth daily.  12/20/18  Yes [place], [name], MD  torsemide (DEMADEX) 20 MG tablet Take 3 tablets (60 mg total) by mouth 2 (two) times daily. 04/28/19  Yes Clegg, Amy D, NP  traZODone (DESYREL) 100 MG tablet TAKE 1 TABLET (100 MG TOTAL) BY MOUTH AT BEDTIME AS NEEDED FOR SLEEP. 05/16/19  Yes [place], [name], MD  vitamin E (VITAMIN E) 1000 UNIT capsule Take 1,000 Units by mouth daily with supper.    Yes [provider]    Current Facility-Administered Medications  Medication  Dose Route Frequency Provider Last Rate Last Admin  . 0.9 %  sodium chloride infusion   Intravenous Continuous Marcell Anger, MD 125 mL/hr at 05/29/19 0400 Rate Verify at 05/29/19 0400  . acetaminophen (TYLENOL) tablet 650 mg  650 mg Oral Q6H PRN Marcell Anger, MD   650 mg at 05/28/19 2343   Or  . acetaminophen (TYLENOL) suppository 650 mg  650 mg Rectal Q6H PRN Marcell Anger, MD      . ceFEPIme (MAXIPIME) 2 g in sodium chloride 0.9 % 100 mL IVPB  2 g Intravenous Q24H Rumbarger, Valeda Malm, RPH      . heparin injection 5,000 Units  5,000 Units Subcutaneous Q8H Marcell Anger, MD   5,000 Units at 05/28/19 2155  . insulin aspart (novoLOG) injection 0-5 Units  0-5 Units Subcutaneous QHS Marcell Anger, MD   4 Units at 05/28/19 2154  . insulin aspart (novoLOG) injection 0-9 Units  0-9 Units Subcutaneous TID WC Marcell Anger, MD   9 Units at 05/28/19 1707  . metroNIDAZOLE (FLAGYL) IVPB 500 mg  500 mg Intravenous Q8H Spongberg, Audie Pinto, MD   Stopped at 05/29/19 0226  . senna-docusate (Senokot-S) tablet 1 tablet  1 tablet Oral QHS PRN Marcell Anger, MD      . Derrill Memo ON 05/30/2019] vancomycin (VANCOREADY) IVPB 1250 mg/250 mL  1,250 mg Intravenous Q48H Rumbarger, Rachel L, RPH        Allergies as of 05/28/2019 - Review Complete 05/28/2019  Allergen Reaction Noted  . Codeine Nausea And Vomiting     Family History  Problem Relation Age of Onset  . Heart attack Mother 58    Social History   Socioeconomic History  . Marital status: Married    Spouse name: Not on file  . Number of children: 3  . Years of education: 12th  .  Highest education level: Not on file  Occupational History    Employer: UNEMPLOYED  Tobacco Use  . Smoking status: Former Smoker    Packs/day: 3.00    Years: 32.00    Pack years: 96.00    Types: Cigarettes    Quit date: 10/24/1997    Years since quitting: 21.6  . Smokeless tobacco: Never Used    Substance and Sexual Activity  . Alcohol use: Not Currently    Comment: "might have 2-3 daiquiris in the summer"  . Drug use: No  . Sexual activity: Never  Other Topics Concern  . Not on file  Social History Narrative   Pt lives at home with her spouse.   Caffeine Use- 3 sodas daily   Social Determinants of Health   Financial Resource Strain:   . Difficulty of Paying Living Expenses: Not on file  Food Insecurity:   . Worried About Charity fundraiser in the Last Year: Not on file  . Ran Out of Food in the Last Year: Not on file  Transportation Needs:   . Lack of Transportation (Medical): Not on file  . Lack of Transportation (Non-Medical): Not on file  Physical Activity:   . Days of Exercise per Week: Not on file  . Minutes of Exercise per Session: Not on file  Stress:   . Feeling of Stress : Not on file  Social Connections:   . Frequency of Communication with Friends and Family: Not on file  . Frequency of Social Gatherings with Friends and Family: Not on file  . Attends Religious Services: Not on file  . Active Member of Clubs or Organizations: Not on file  . Attends Archivist Meetings: Not on file  . Marital Status: Not on file  Intimate Partner Violence:   . Fear of Current or Ex-Partner: Not on file  . Emotionally Abused: Not on file  . Physically Abused: Not on file  . Sexually Abused: Not on file    Review of Systems: Gen: Denies fever, sweats or chills. No weight loss.  CV: + LE edema. Denies CP or palpitations. Resp: No current SOB. GI: See HPI.  GU : Denies urinary burning, blood in urine, increased urinary frequency or incontinence. MS: Denies joint pain, muscles aches or weakness. Derm: Denies rash, itchiness, skin lesions or unhealing ulcers. Psych: + confusion, see HPI. Heme: Denies bruising, bleeding. Neuro:  Denies headaches, dizziness or paresthesias. Endo:  Denies any problems with DM, thyroid or adrenal function.  Physical  Exam: Vital signs in last 24 hours: Temp:  [97.4 F (36.3 C)-100.8 F (38.2 C)] 98.3 F (36.8 C) (01/03 0509) Pulse Rate:  [84-94] 85 (01/03 0509) Resp:  [14-20] 14 (01/03 0509) BP: (114-149)/(44-96) 137/96 (01/03 0509) SpO2:  [94 %-97 %] 94 % (01/03 0509) Weight:  [115.5 kg] 115.5 kg (01/02 1745) Last BM Date: 05/28/19 General:   69 year old female awake in NAD. Head:  Normocephalic and atraumatic. Eyes:  Scleral icterus present. Conjunctiva pink. Ears:  Normal auditory acuity. Nose:  No deformity, discharge or lesions. Mouth:  No deformity or lesions.  Few missing dentition. Tongue is beefy red and dry.  Neck:  Supple. Lungs:  Breath sounds clear throughout.  Heart:  Irregular rhythm, no murmur.  Abdomen:  Soft, nondistended, epigastric and RUQ tenderness without rebound or guarding, + BS x 4 quads. No HSM.  Rectal:  Deferred  Msk: Right foot amputation/deformity.  Pulses:  Normal pulses noted. Extremities:  Bilateral LE with 2+  pitting edema, pinkish discoloration to pretib area without increased heat. Negative Homan's. Neurologic:  Alert and  oriented x 1. Speech is clear. She follows commands appropriately. MAEs.  Skin:  Jaundice.  Psych:  Alert and cooperative. Normal mood and affect.  Intake/Output from previous day: 01/02 0701 - 01/03 0700 In: 1393.9 [I.V.:193.9; IV Piggyback:1200] Out: 600 [Urine:600] Intake/Output this shift: Total I/O In: 293.9 [I.V.:193.9; IV Piggyback:100] Out: 600 [Urine:600]  Lab Results: Recent Labs    05/28/19 1151 05/28/19 1405 05/28/19 1808 05/29/19 0221  WBC 8.6  --  6.6 5.5  HGB 13.6 14.3 12.5 11.5*  HCT 43.1 42.0 40.1 36.5  PLT 170  --  149* 143*   BMET Recent Labs    05/28/19 1151 05/28/19 1405 05/28/19 1808 05/29/19 0221  NA 135 134*  --  136  K 3.2* 4.4  --  3.5  CL 97*  --   --  104  CO2 25  --   --  18*  GLUCOSE 450*  --   --  410*  BUN 42*  --   --  54*  CREATININE 2.41*  --  2.70* 2.78*  CALCIUM 8.8*  --    --  8.1*   LFT Recent Labs    05/29/19 0221  PROT 4.8*  ALBUMIN 2.0*  AST 225*  ALT 251*  ALKPHOS 554*  BILITOT 5.4*   PT/INR Recent Labs    05/28/19 1330  LABPROT 13.7  INR 1.1   Hepatitis Panel Recent Labs    05/28/19 1151  HEPBSAG NON REACTIVE  HCVAB NON REACTIVE  HEPAIGM NON REACTIVE  HEPBIGM NON REACTIVE      Studies/Results: CT ABDOMEN PELVIS WO CONTRAST  Result Date: 05/28/2019 CLINICAL DATA:  Elevated liver function test, generalized abdominal pain. EXAM: CT ABDOMEN AND PELVIS WITHOUT CONTRAST TECHNIQUE: Multidetector CT imaging of the abdomen and pelvis was performed following the standard protocol without IV contrast. COMPARISON:  March 11, 2019. FINDINGS: Lower chest: Mild right middle lobe subsegmental atelectasis or infiltrate is noted. Hepatobiliary: Mild cholelithiasis is noted. No biliary dilatation is noted. No focal abnormality is seen in the liver on these unenhanced images. Pancreas: Unremarkable. No pancreatic ductal dilatation or surrounding inflammatory changes. Spleen: Calcified splenic granulomata are noted. Adrenals/Urinary Tract: Adrenal glands are unremarkable. Kidneys are normal, without renal calculi, focal lesion, or hydronephrosis. Bladder is unremarkable. Stomach/Bowel: Stomach is within normal limits. Appendix appears normal. No evidence of bowel wall thickening, distention, or inflammatory changes. Vascular/Lymphatic: Aortic atherosclerosis. No enlarged abdominal or pelvic lymph nodes. Reproductive: Prostate is unremarkable. Other: No abdominal wall hernia or abnormality. No abdominopelvic ascites. Musculoskeletal: No acute or significant osseous findings. IMPRESSION: Cholelithiasis. Mild right middle lobe subsegmental atelectasis or infiltrate is noted. Aortic Atherosclerosis (ICD10-I70.0). Electronically Signed   By: Marijo Conception M.D.   On: 05/28/2019 14:50   DG Chest 2 View  Result Date: 05/28/2019 CLINICAL DATA:  Shortness of breath.  Elevated blood sugar. Hypertension. CHF. EXAM: CHEST - 2 VIEW COMPARISON:  03/11/2019 FINDINGS: Minimally motion degraded lateral view. Patient rotated left on the frontal radiograph. Borderline cardiomegaly. Atherosclerosis in the transverse aorta. No pleural effusion or pneumothorax. No congestive failure. Clear lungs. IMPRESSION: No acute cardiopulmonary disease. Aortic Atherosclerosis (ICD10-I70.0). Electronically Signed   By: Abigail Miyamoto M.D.   On: 05/28/2019 12:15   CT Head Wo Contrast  Result Date: 05/28/2019 CLINICAL DATA:  Hyperglycemia, encephalopathy EXAM: CT HEAD WITHOUT CONTRAST TECHNIQUE: Contiguous axial images were obtained from the base of the skull through the  vertex without intravenous contrast. COMPARISON:  10/25/2015 FINDINGS: Brain: Brain atrophy and chronic white matter microvascular ischemic changes throughout both cerebral hemispheres. No acute intracranial hemorrhage, new mass lesion, acute infarction, midline shift, herniation, hydrocephalus, or extra-axial fluid collection. No focal mass effect or edema. Cisterns are patent. Cerebellar atrophy as well. Vascular: No hyperdense vessel or unexpected calcification. Skull: Normal. Negative for fracture or focal lesion. Sinuses/Orbits: Minor maxillary mucosal thickening. No sinus air-fluid level or significant opacification. Orbits are symmetric. No acute finding. Other: None. IMPRESSION: Stable atrophy and chronic white matter microvascular ischemic changes. No acute intracranial abnormality by noncontrast CT. Electronically Signed   By: Jerilynn Mages.  Shick M.D.   On: 05/28/2019 14:46   MR ABDOMEN MRCP WO CONTRAST  Result Date: 05/28/2019 CLINICAL DATA:  Rule out CBD stone EXAM: MRI ABDOMEN WITHOUT CONTRAST  (INCLUDING MRCP) TECHNIQUE: Multiplanar multisequence MR imaging of the abdomen was performed. Heavily T2-weighted images of the biliary and pancreatic ducts were obtained, and three-dimensional MRCP images were rendered by post processing.  COMPARISON:  CT abdomen pelvis, 05/27/2018 FINDINGS: Examination is generally limited by patient motion and breath motion artifact. By technologist report, patient was agitated and yelling in scanner and unable to complete exam. Lower chest: No acute findings. Hepatobiliary: No mass or other parenchymal abnormality identified. Multiple small gallstones in the dependent gallbladder. No biliary ductal dilatation or common bile duct calculus. Pancreas: No mass, inflammatory changes, or other parenchymal abnormality identified. Spleen:  Within normal limits in size and appearance. Adrenals/Urinary Tract: No masses identified. No evidence of hydronephrosis. Stomach/Bowel: Susceptibility artifact in the gastric body secondary to endoscopically placed clip. Visualized portions within the abdomen are unremarkable. Vascular/Lymphatic: No pathologically enlarged lymph nodes identified. No abdominal aortic aneurysm demonstrated. Other:  None. Musculoskeletal: No suspicious bone lesions identified. IMPRESSION: Examination is generally limited by patient motion and breath motion artifact. By technologist report, patient was agitated and yelling in scanner and unable to complete exam. Within this limitation, cholelithiasis without evidence of acute cholecystitis. No evidence of common bile duct calculus. No biliary ductal dilatation. Electronically Signed   By: Eddie Candle M.D.   On: 05/28/2019 20:02    IMPRESSION/PLAN:  25. 70 year old female with nausea, epigastric pain and elevated LFTs consistent with acute cholestasis. CTAP showed cholelithiasis. MRCP showed cholelithiasis without evidence of cholecystitis or choledocholithiasis. T max 100.8  On 1/2. Today she is afebrile. WBC 5.5. Alk phos 554. AST 225. ALT 251. T. Bili 5.4. She possible passed a common bile duct stone verses underlying liver disease. -Abdominal sonogram  -AMA, SMA, IgG, ceruloplasmin level, iron, GGT -Repeat CBC with diff, BMP and hepatic panel in  am -Zofran 44m IV PRN -Continue Cefepime 2gm IV Q 24 hrs for now -Clear liquid diet -Protonix 466mIV Q 24 hrs  2. Encephalopathy, multifactorial poorly controlled DM, AKI, developing sepsis, ? underlying liver disease: Ammonia 51. Head CT was negative.  -monitor neurostatus closely   3. Acute on CKD. Admission Cr. 2.41. (Cr. 1.95 on 12/28). Today Cr. 2.78.  3. Mild anemia, chronic + dilutional component. Hg 11.5 >> 12.5 >> 14.3. HCT 36.5. MCV 81.5. History of UGI bleed secondary to a large HP gastric polyp 02/2019 while on Plavix and ASA. FOBT 05/28/2019 Negative. No signs of active GI bleeding  -monitor daily CBC -Iron panel and Vitamin B12 level   4. DM II poorly controlled  5. CAD, MI x 2, CHF on Plavix and ASA. Plavix on hold.   6. History of tubular adenomatous colon polyps  Further recommendations per Dr. Karma Lew Dorathy Daft  05/29/2019, 5:26 AM    Attending physician's note   I have taken a history, examined the patient and reviewed the chart. I agree with the Advanced Practitioner's note, impression and recommendations.  69 year old female with obesity, CKD, CAD, diastolic dysfunction admitted with fever and abnormal LFT, mixed liver injury and cholestasis No leukocytosis Covid 19 negative SIRS, has elevated CRP, troponin with multiorgan dysfunction.  Unclear source of infection MRCP negative for CBD dilation, stones or acute cholecystitis Blood cultures no growth to date On IV vancomycin, Flagyl and cefepime.  Consider narrowing antibiotic coverage.    Differential includes drug-induced liver injury vs viral hepatitis vs sequelae of Covid infection though denies any history of exposure Hep A, B and C negative We will also check for EBV, CMV in addition to autoimmune work-up  Acute on chronic kidney disease: SIRS and likely ATN  CAD on aspirin and Plavix, Plavix currently on hold.  No plan for ERCP.  Okay to continue Plavix from GI  standpoint  Altered mental status likely multifactorial, continue to monitor.  Lactulose 10gm BID PRN No asterixis on exam  No definitive features of portal hypertension but has calcified granulomas in spleen.  No liver lesions on noncontrast study Will follow-up abdominal ultrasound with Dopplers  Advance diet as tolerated  Dr.Mann/Dr.Hung will take over tomorrow for GI coverage  K. Denzil Magnuson , MD [phone]

## 2019-05-30 ENCOUNTER — Inpatient Hospital Stay (HOSPITAL_COMMUNITY): Payer: PPO

## 2019-05-30 DIAGNOSIS — K831 Obstruction of bile duct: Secondary | ICD-10-CM

## 2019-05-30 LAB — CBC
HCT: 36.3 % (ref 36.0–46.0)
Hemoglobin: 11.3 g/dL — ABNORMAL LOW (ref 12.0–15.0)
MCH: 25.9 pg — ABNORMAL LOW (ref 26.0–34.0)
MCHC: 31.1 g/dL (ref 30.0–36.0)
MCV: 83.1 fL (ref 80.0–100.0)
Platelets: 155 10*3/uL (ref 150–400)
RBC: 4.37 MIL/uL (ref 3.87–5.11)
RDW: 14.5 % (ref 11.5–15.5)
WBC: 5.2 10*3/uL (ref 4.0–10.5)
nRBC: 0 % (ref 0.0–0.2)

## 2019-05-30 LAB — COMPREHENSIVE METABOLIC PANEL
ALT: 200 U/L — ABNORMAL HIGH (ref 0–44)
AST: 149 U/L — ABNORMAL HIGH (ref 15–41)
Albumin: 1.8 g/dL — ABNORMAL LOW (ref 3.5–5.0)
Alkaline Phosphatase: 593 U/L — ABNORMAL HIGH (ref 38–126)
Anion gap: 9 (ref 5–15)
BUN: 57 mg/dL — ABNORMAL HIGH (ref 8–23)
CO2: 23 mmol/L (ref 22–32)
Calcium: 7.9 mg/dL — ABNORMAL LOW (ref 8.9–10.3)
Chloride: 103 mmol/L (ref 98–111)
Creatinine, Ser: 2.99 mg/dL — ABNORMAL HIGH (ref 0.44–1.00)
GFR calc Af Amer: 18 mL/min — ABNORMAL LOW (ref >60)
GFR calc non Af Amer: 15 mL/min — ABNORMAL LOW (ref >60)
Glucose, Bld: 170 mg/dL — ABNORMAL HIGH (ref 70–99)
Potassium: 3.1 mmol/L — ABNORMAL LOW (ref 3.5–5.1)
Sodium: 135 mmol/L (ref 135–145)
Total Bilirubin: 3 mg/dL — ABNORMAL HIGH (ref 0.3–1.2)
Total Protein: 4.6 g/dL — ABNORMAL LOW (ref 6.5–8.1)

## 2019-05-30 LAB — URINE CULTURE: Culture: 30000 — AB

## 2019-05-30 LAB — GLUCOSE, CAPILLARY
Glucose-Capillary: 136 mg/dL — ABNORMAL HIGH (ref 70–99)
Glucose-Capillary: 150 mg/dL — ABNORMAL HIGH (ref 70–99)
Glucose-Capillary: 215 mg/dL — ABNORMAL HIGH (ref 70–99)
Glucose-Capillary: 256 mg/dL — ABNORMAL HIGH (ref 70–99)
Glucose-Capillary: 302 mg/dL — ABNORMAL HIGH (ref 70–99)
Glucose-Capillary: 395 mg/dL — ABNORMAL HIGH (ref 70–99)

## 2019-05-30 LAB — MITOCHONDRIAL ANTIBODIES: Mitochondrial M2 Ab, IgG: <20 Units (ref 0.0–20.0)

## 2019-05-30 LAB — ANTI-SMOOTH MUSCLE ANTIBODY, IGG: F-Actin IgG: 5 Units (ref 0–19)

## 2019-05-30 LAB — IGG: IgG (Immunoglobin G), Serum: 651 mg/dL (ref 586–1602)

## 2019-05-30 LAB — ANA: Anti Nuclear Antibody (ANA): POSITIVE — AB

## 2019-05-30 LAB — ALPHA-1-ANTITRYPSIN: A-1 Antitrypsin, Ser: 191 mg/dL — ABNORMAL HIGH (ref 101–187)

## 2019-05-30 MED ORDER — INSULIN GLARGINE 100 UNIT/ML ~~LOC~~ SOLN
20.0000 [IU] | Freq: Every day | SUBCUTANEOUS | Status: DC
  Administered 2019-05-31: 20 [IU] via SUBCUTANEOUS
  Filled 2019-05-30 (×2): qty 0.2

## 2019-05-30 MED ORDER — CARVEDILOL 6.25 MG PO TABS
6.2500 mg | ORAL_TABLET | Freq: Two times a day (BID) | ORAL | Status: DC
  Administered 2019-05-30 – 2019-06-03 (×8): 6.25 mg via ORAL
  Filled 2019-05-30 (×8): qty 1

## 2019-05-30 MED ORDER — ESCITALOPRAM OXALATE 20 MG PO TABS
20.0000 mg | ORAL_TABLET | Freq: Every day | ORAL | Status: DC
  Administered 2019-05-30 – 2019-06-03 (×5): 20 mg via ORAL
  Filled 2019-05-30 (×5): qty 1

## 2019-05-30 MED ORDER — CARVEDILOL 12.5 MG PO TABS: 12.5000 mg | ORAL_TABLET | Freq: Two times a day (BID) | ORAL | Status: DC

## 2019-05-30 MED ORDER — ASPIRIN EC 81 MG PO TBEC
81.0000 mg | DELAYED_RELEASE_TABLET | Freq: Every day | ORAL | Status: DC
  Administered 2019-05-30 – 2019-06-03 (×5): 81 mg via ORAL
  Filled 2019-05-30 (×5): qty 1

## 2019-05-30 MED ORDER — LEVOTHYROXINE SODIUM 50 MCG PO TABS
50.0000 ug | ORAL_TABLET | Freq: Every day | ORAL | Status: DC
  Administered 2019-05-31 – 2019-06-03 (×4): 50 ug via ORAL
  Filled 2019-05-30 (×4): qty 1

## 2019-05-30 MED ORDER — PREGABALIN 100 MG PO CAPS
100.0000 mg | ORAL_CAPSULE | Freq: Two times a day (BID) | ORAL | Status: DC
  Administered 2019-05-30 – 2019-06-03 (×9): 100 mg via ORAL
  Filled 2019-05-30 (×9): qty 1

## 2019-05-30 MED ORDER — POTASSIUM CHLORIDE CRYS ER 20 MEQ PO TBCR
40.0000 meq | EXTENDED_RELEASE_TABLET | Freq: Once | ORAL | Status: AC
  Administered 2019-05-30: 40 meq via ORAL
  Filled 2019-05-30: qty 2

## 2019-05-30 MED ORDER — PANTOPRAZOLE SODIUM 40 MG PO TBEC
40.0000 mg | DELAYED_RELEASE_TABLET | Freq: Every day | ORAL | Status: DC
  Administered 2019-05-30 – 2019-06-03 (×5): 40 mg via ORAL
  Filled 2019-05-30 (×5): qty 1

## 2019-05-30 MED ORDER — FERROUS SULFATE 325 (65 FE) MG PO TABS
325.0000 mg | ORAL_TABLET | Freq: Two times a day (BID) | ORAL | Status: DC
  Administered 2019-05-30 – 2019-06-03 (×8): 325 mg via ORAL
  Filled 2019-05-30 (×8): qty 1

## 2019-05-30 MED ORDER — CLOPIDOGREL BISULFATE 75 MG PO TABS
75.0000 mg | ORAL_TABLET | Freq: Every day | ORAL | Status: DC
  Administered 2019-05-30 – 2019-06-03 (×5): 75 mg via ORAL
  Filled 2019-05-30 (×5): qty 1

## 2019-05-30 NOTE — Progress Notes (Signed)
Subjective: 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 Patient had complained of some nausea and abdominal pain on admission without any history of dysphagia odynophagia vomiting melena hematochezia. The details of her history were unavailable as she was somewhat confused.  She had an EGD done by Dr. Graylon Gunning on 03/13/2019 which when a glass gastric hyperplastic polyp was noted a colonoscopy done the same day revealed diverticulosis and 6 tubular adenomas were removed.  Patient denies any abdominal pain or nausea at this time and wants to advance her diet.  She has been on full liquids since this morning.  She was noted to have abnormal LFTs with AST of 200 and ALT of 240 and alkaline phosphatase of 599 with a total protein of 5.2. Dr. Silverio Decamp ordered more labs on her including an IgG, AMA ASMA, ceruloplasmin and iron levels, the results of which are pending at this time.  Doppler studies revealed no evidence of portal vein thrombosis.  Objective: Vital signs in last 24 hours: Temp:  [97.6 F (36.4 C)-98.5 F (36.9 C)] 97.6 F (36.4 C) (01/04 1456) Pulse Rate:  [72-79] 79 (01/04 1456) Resp:  [15-19] 19 (01/04 1456) BP: (122-150)/(52-68) 150/68 (01/04 1456) SpO2:  [97 %-99 %] 99 % (01/04 1456) Weight:  [122 kg] 122 kg (01/04 0500) Last BM Date: 05/28/19  Intake/Output from previous day: 01/03 0701 - 01/04 0700 In: 1594.1 [P.O.:300; I.V.:1194.1; IV Piggyback:100] Out: 1200 [Urine:1200] Intake/Output this shift: No intake/output data recorded.  General appearance: alert, cooperative, appears stated age, fatigued, no  distress and morbidly obese; patient is oriented to month and year but could not tell me the day or the day of the week. Otherwise she seemed very coherent and actively engaged in conversation Resp: clear to auscultation bilaterally Cardio: regular rate and rhythm, S1, S2 normal, no murmur, click, rub or gallop GI: soft, non-tender; bowel sounds normal; no masses,  no organomegaly  Lab Results: Recent Labs    05/28/19 1808 05/29/19 0221 05/30/19 0416  WBC 6.6 5.5 5.2  HGB 12.5 11.5* 11.3*  HCT 40.1 36.5 36.3  PLT 149* 143* 155   BMET Recent Labs    05/28/19 1151 05/28/19 1405 05/28/19 1808 05/29/19 0221 05/30/19 0416  NA 135 134*  --  136 135  K 3.2* 4.4  --  3.5 3.1*  CL 97*  --   --  104 103  CO2 25  --   --  18* 23  GLUCOSE 450*  --   --  410* 170*  BUN 42*  --   --  54* 57*  CREATININE 2.41*  --  2.70* 2.78* 2.99*  CALCIUM 8.8*  --   --  8.1* 7.9*   LFT Recent Labs    05/29/19 1207 05/30/19 0416  PROT 5.2* 4.6*  ALBUMIN 2.0* 1.8*  AST 208* 149*  ALT 240* 200*  ALKPHOS 599* 593*  BILITOT 5.1* 3.0*  BILIDIR 3.4*  --   IBILI 1.7*  --    PT/INR Recent Labs    05/28/19 1330  LABPROT 13.7  INR 1.1   Hepatitis Panel Recent Labs    05/28/19 1151  HEPBSAG NON REACTIVE  HCVAB  NON REACTIVE  HEPAIGM NON REACTIVE  HEPBIGM NON REACTIVE   Studies/Results: MR ABDOMEN MRCP WO CONTRAST  Result Date: 05/28/2019 CLINICAL DATA:  Rule out CBD stone EXAM: MRI ABDOMEN WITHOUT CONTRAST  (INCLUDING MRCP) TECHNIQUE: Multiplanar multisequence MR imaging of the abdomen was performed. Heavily T2-weighted images of the biliary and pancreatic ducts were obtained, and three-dimensional MRCP images were rendered by post processing. COMPARISON:  CT abdomen pelvis, 05/27/2018 FINDINGS: Examination is generally limited by patient motion and breath motion artifact. By technologist report, patient was agitated and yelling in scanner and unable to complete exam. Lower chest: No acute  findings. Hepatobiliary: No mass or other parenchymal abnormality identified. Multiple small gallstones in the dependent gallbladder. No biliary ductal dilatation or common bile duct calculus. Pancreas: No mass, inflammatory changes, or other parenchymal abnormality identified. Spleen:  Within normal limits in size and appearance. Adrenals/Urinary Tract: No masses identified. No evidence of hydronephrosis. Stomach/Bowel: Susceptibility artifact in the gastric body secondary to endoscopically placed clip. Visualized portions within the abdomen are unremarkable. Vascular/Lymphatic: No pathologically enlarged lymph nodes identified. No abdominal aortic aneurysm demonstrated. Other:  None. Musculoskeletal: No suspicious bone lesions identified. IMPRESSION: Examination is generally limited by patient motion and breath motion artifact. By technologist report, patient was agitated and yelling in scanner and unable to complete exam. Within this limitation, cholelithiasis without evidence of acute cholecystitis. No evidence of common bile duct calculus. No biliary ductal dilatation. Electronically Signed   By: Eddie Candle M.D.   On: 05/28/2019 20:02   US LIVER DOPPLER  Result Date: 05/30/2019 CLINICAL DATA:  Elevated LFTs EXAM: DUPLEX ULTRASOUND OF LIVER TECHNIQUE: Color and duplex Doppler ultrasound was performed to evaluate the hepatic in-flow and out-flow vessels. COMPARISON:  05/28/2019 FINDINGS: Portal Vein Velocities Main:  31 cm/sec Right:  22 cm/sec Left:  23 cm/sec Hepatic Vein Velocities Right:  34 cm/sec Middle:  53 cm/sec Left:  32 cm/sec Hepatic Artery Velocity:  253 cm/sec Splenic Vein Velocity:  34 cm/sec Varices: Not visualized Ascites: Absent Patent portal, hepatic and splenic veins with normal directional flow. Negative for portal vein occlusion or thrombus. Cholelithiasis evident. Mild splenic enlargement measuring 12.3 x 15 x 4.3 cm. Splenic volume 423 cc. IMPRESSION: Normal hepatic venous Doppler.  Negative for portal vein occlusion or thrombus Cholelithiasis Mild splenomegaly Electronically Signed   By: Jerilynn Mages.  Shick M.D.   On: 05/30/2019 13:02   Medications: I have reviewed the patient's current medications.  Assessment/Plan: 1) Abnormal LFTs with a altered mental status on admission-cholestatic picture with primarily elevated alkaline phosphatase and elevated transaminases.  Awaiting additional labs to result. 2) Cholelithiasis without cholecystitis. 3) Klebsiella pneumonia and UTI urine culture on Cefepime. 4) Acute on chronic kidney disease stage IV/anemia. 5) Poorly controlled diabetes with A1c of 13.1 on 05/23/2019. 6) Coronary artery disease status post MI with 1 metal stent on Plavix. 7) Peripheral vascular disease. 8) Morbid obesity.  LOS: 2 days   [name] 05/30/2019, 5:04 PM

## 2019-05-30 NOTE — Progress Notes (Signed)
Inpatient Diabetes Program Recommendations  AACE/ADA: New Consensus Statement on Inpatient Glycemic Control   Target Ranges:  Prepandial:   less than 140 mg/dL      Peak postprandial:   less than 180 mg/dL (1-2 hours)      Critically ill patients:  140 - 180 mg/dL  Results for Susan [name], Susan [name] (MRN [id number]) as of 05/30/2019 11:02  Ref. Range 05/29/2019 07:22 05/29/2019 12:18 05/29/2019 16:03 05/29/2019 20:07 05/29/2019 23:44 05/30/2019 03:52 05/30/2019 07:42  Glucose-Capillary Latest Ref Range: 70 - 99 mg/dL 378 (H)  Novolog 9 units 361 (H)  Novolog 20 units  Lantus 15 units 284 (H)  Novolog 11 units 202 (H)  Novolog 7 units 169 (H)  Novolog 1 units 150 (H)  Novolog 3 units 136 (H)  Novolog 3 units   Results for Susan [name], Susan [name] (MRN [id number]) as of 05/30/2019 11:02  Ref. Range 03/11/2019 17:40 05/23/2019 14:48  Hemoglobin A1C Latest Ref Range: <5.7 % of total Hgb 12.0 (H) 13.1 (H)   Review of Glycemic Control  Diabetes history: DM2 Outpatient Diabetes medications: NPH 15 units QAM if CBGs >300 or 10 units if CBG 200-300 mg/dl, OmniPod with Novolin Regular (Manually add bolus to continuous dose via OmniPod 3 times daily per sliding scale: CBG 80-150 7 units, 151-200 9 units, 201-250 12 units, 251-300 14 units, 301-400 17 units) Current orders for Inpatient glycemic control: Lantus 15 units daily, Novolog 0-20 units Q4H  Inpatient Diabetes Program Recommendations:   Insulin- Basal: Please consider increasing Lantus to 20 units daily.  HbgA1C: A1C 13.1% on 05/23/19 (was 12% on 03/11/19). Diabetes Coordinator spoke with patient on 03/12/19 during prior hospital admission.    NOTE: Per chart review, noted inpatient diabetes coordinator spoke with patient on 03/12/19 during prior hospital admission. Per note on 03/12/19 by J.Rosebud Poles, RN, Diabetes Coordinator, patient sees Dr. [name] (Endocrinologist) and she take NPH for basal needs and uses the OmniPod insulin pump for boluses only.     Thanks, Barnie Alderman, RN, MSN, CDE Diabetes Coordinator Inpatient Diabetes Program [phone] (Team Pager from 8am to 5pm)

## 2019-05-30 NOTE — Progress Notes (Signed)
PROGRESS NOTE    Susan [name]  UTM:[id number] DOB: 1951-02-25 DOA: 05/28/2019 PCP: Alycia Rossetti, MD   Brief Narrative: As per HPI:  69 y.o.femalewith medical historyobtained from medical record secondary patient's encephalopathy in the ER. 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 Patient appears to be altered unable to find much meaningful history. When asked if she had fevers she reported yes and her temperature was 305. Was provided additional information to the ER indicating that her blood sugar has been mild to moderately elevated and she has been confused for 1 to 2 days. I was unable to obtain any meaningful information from the patient other than she has some abdominal pain primarily located in the epigastric region she was unable to provide a severity or quantify it simply reporting it was hurting. She did report the ongoing for the last several days which was confirmed by her husband did not report any significant changes in dietary intake, travel, or infectious exposures Patient was admitted, followed by gastroenterology.  Subjective: " I am starving" Denies any nausea, vomiting or abd pain. On RA. Afebrile overnight saturating 97% on room air.  RUQ [name] completed this morning.  Assessment & Plan:   Abnormal LFTs: Common bile duct obstruction versus underlying liver disease versus viral hepatitis versus sequelae of Covid infection. Appreciate GI input undergoing extensive work-up avoid CT abdomen that showed cholelithiasis, MRCP showed cholelithiasis without evidence of cholecystitis or choledocholithiasis.  LFTs slowly downtrending possibly passed CBD stone.  Ultrasound abdomen with Doppler pending. Follow-up with serology work-up with ANA, SMA, IgG, ceruloplasmin, iron, GGT.   Continue with cefepime for concern for cholangitis but afebrile now.  Discontinue vancomycin.  WBC at 5.2K.  On full liquid diet.  Plan as per GI. Recent Labs  Lab 05/23/19 1448 05/28/19 1151 05/28/19 1330 05/29/19 0221 05/29/19 1207 05/30/19 0416  AST 15 450*  --  225* 208* 149*  ALT 15 363*  --  251* 240* 200*  ALKPHOS  --  567*  --  554* 599* 593*  BILITOT 0.4 6.6*  --  5.4* 5.1* 3.0*  PROT 5.6* 6.0*  --  4.8* 5.2* 4.6*  ALBUMIN  --  2.6*  --  2.0* 2.0* 1.8*  INR  --   --  1.1  --   --   --    Acute metabolic encephalopathy suspect multifactorial in the setting of poorly controlled diabetes, renal failure?  Developing sepsis, question underlying liver disease.  CT head was negative, neuro status is stable.  She is alert awake conversant today.  Continue lactulose.  Klebsiella pneumoniae un urine culture continue cefepime as #1.  Mild anemia likely chronic question dilutional, hemoglobin more or less stable.  History of upper GI bleed secondary to large HP gastric polyp in October 2020 while on Plavix and aspirin, for FOBT -1/2.  No signs of active GI bleeding.  B12 349, iron panel shows ferritin 359, iron 20.  AKI on CKD Stage IV: His creatinine ranging from 1.9-2.4-monitor renal function. Bun/creat slightly uptrending- pt on ivf- will cont the same. Monitor I/o.  Discontinue vancomycin. Recent Labs  Lab 05/23/19 1448 05/28/19 1151 05/28/19 1808 05/29/19 0221 05/30/19 0416  BUN 36* 42*  --  54* 57*  CREATININE 1.95* 2.41* 2.70* 2.78* 2.99*   CAD, history of MI x2: On Plavix and aspirin.  Okay to resume Plavix as  per GI.  Resume aspirin Plavix and Coreg.  Hyperlipemia: Hold statins for now.  Chronic diastolic heart failure/ trop  301->283-209. No chest pain/ liklely form aki/demand mismatch. Patient being hydrated overnight but cautiously monitoring ins and outs, daily weights-On IV fluids in the setting of #1, cut down IV fluid.watch closely for resp symptoms.  Patient on  torsemide and metolazone at home.  PVD-resume home antiplatelets  Diabetes poorly controlled with hyperglycemia , last A1c 13.1 on 12/28 continue sliding scale insulin.  Increase Lantus to 20 units daily.  Hypokalemia:repleted  Morbid obesity with  Body mass index is 44.76 kg/m.  Need follow-up with PCP for weight loss healthy lifestyle.   DVT prophylaxis:Heparin Code Status: full Family Communication: plan of care discussed with patient at bedside. Disposition Plan: Remains inpatient pending improvement in the liver function and once okay with GI.    Consultants: GI Procedures: NONE Microbiology: NONE  Antimicrobials: Anti-infectives (From admission, onward)   Start     Dose/Rate Route Frequency Ordered Stop   05/30/19 2000  vancomycin (VANCOREADY) IVPB 1250 mg/250 mL     1,250 mg 166.7 mL/hr over 90 Minutes Intravenous Every 48 hours 05/28/19 1743     05/29/19 1700  ceFEPIme (MAXIPIME) 2 g in sodium chloride 0.9 % 100 mL IVPB     2 g 200 mL/hr over 30 Minutes Intravenous Every 24 hours 05/28/19 1747     05/29/19 0100  metroNIDAZOLE (FLAGYL) IVPB 500 mg     500 mg 100 mL/hr over 60 Minutes Intravenous Every 8 hours 05/28/19 1744     05/28/19 1600  ceFEPIme (MAXIPIME) 2 g in sodium chloride 0.9 % 100 mL IVPB     2 g 200 mL/hr over 30 Minutes Intravenous  Once 05/28/19 1553 05/28/19 1713   05/28/19 1600  metroNIDAZOLE (FLAGYL) IVPB 500 mg     500 mg 100 mL/hr over 60 Minutes Intravenous  Once 05/28/19 1553 05/28/19 1750   05/28/19 1600  vancomycin (VANCOCIN) IVPB 1000 mg/200 mL premix  Status:  Discontinued     1,000 mg 200 mL/hr over 60 Minutes Intravenous  Once 05/28/19 1553 05/28/19 1557   05/28/19 1600  vancomycin (VANCOREADY) IVPB 2000 mg/400 mL     2,000 mg 200 mL/hr over 120 Minutes Intravenous  Once 05/28/19 1557 05/30/19 0651       Objective: Vitals:   05/29/19 1607 05/29/19 2010 05/30/19 0357 05/30/19 0500  BP: 133/81 (!) 141/52 (!) 122/55   Pulse: 79  75 72   Resp: 18 15 16    Temp: 98 F (36.7 C) 98.5 F (36.9 C) 98 F (36.7 C)   TempSrc: Oral Oral Oral   SpO2: 95% 97% 97%   Weight:    122 kg  Height:        Intake/Output Summary (Last 24 hours) at 05/30/2019 0824 Last data filed at 05/30/2019 0520 Gross per 24 hour  Intake 1594.14 ml  Output 1200 ml  Net 394.14 ml   Filed Weights   05/28/19 1745 05/29/19 0500 05/30/19 0500  Weight: 115.5 kg 119.8 kg 122 kg   Weight change: 6.5 kg  Body mass index is 44.76 kg/m.  Intake/Output from previous day: 01/03 0701 - 01/04 0700 In: 1594.1 [P.O.:300; I.V.:1194.1; IV Piggyback:100] Out: 1200 [Urine:1200] Intake/Output this shift: No intake/output data recorded.  Examination:  General exam: AAOX3,NAD, Weak appearing. HEENT:Oral mucosa moist, Ear/Nose WNL grossly, dentition normal. Respiratory system: Diminished at the base,no wheezing or crackles,no use of accessory muscle Cardiovascular system: S1 & S2 +,  No JVD,. Gastrointestinal system: Abdomen soft, NT,ND, BS+, obese abdomen. Nervous System:Alert, awake, moving extremities and grossly nonfocal Extremities: Non pitting edema, Rt TMA stump, distal peripheral pulses palpable.  Skin: No rashes,no icterus. MSK: Normal muscle bulk,tone, power  Medications:  Scheduled Meds: . heparin  5,000 Units Subcutaneous Q8H  . insulin aspart  0-20 Units Subcutaneous Q4H  . insulin glargine  15 Units Subcutaneous Daily   Continuous Infusions: . sodium chloride 125 mL/hr at 05/30/19 0520  . ceFEPime (MAXIPIME) IV 2 g (05/29/19 1602)  . metronidazole 500 mg (05/30/19 0747)  . vancomycin      Data Reviewed: I have personally reviewed following labs and imaging studies  CBC: Recent Labs  Lab 05/23/19 1448 05/28/19 1151 05/28/19 1405 05/28/19 1808 05/29/19 0221 05/30/19 0416  WBC 6.7 8.6  --  6.6 5.5 5.2  NEUTROABS 4,965  --   --   --  4.7  --   HGB 11.7 13.6 14.3 12.5 11.5* 11.3*  HCT 37.2 43.1 42.0 40.1 36.5 36.3  MCV 82.7  81.3  --  83.0 81.5 83.1  PLT 228 170  --  149* 143* 382   Basic Metabolic Panel: Recent Labs  Lab 05/23/19 1448 05/28/19 1151 05/28/19 1405 05/28/19 1808 05/29/19 0221 05/30/19 0416  NA 139 135 134*  --  136 135  K 3.8 3.2* 4.4  --  3.5 3.1*  CL 103 97*  --   --  104 103  CO2 24 25  --   --  18* 23  GLUCOSE 395* 450*  --   --  410* 170*  BUN 36* 42*  --   --  54* 57*  CREATININE 1.95* 2.41*  --  2.70* 2.78* 2.99*  CALCIUM 8.7 8.8*  --   --  8.1* 7.9*   GFR: Estimated Creatinine Clearance: 23.3 mL/min (A) (by C-G formula based on SCr of 2.99 mg/dL (H)). Liver Function Tests: Recent Labs  Lab 05/23/19 1448 05/28/19 1151 05/29/19 0221 05/29/19 1207 05/30/19 0416  AST 15 450* 225* 208* 149*  ALT 15 363* 251* 240* 200*  ALKPHOS  --  567* 554* 599* 593*  BILITOT 0.4 6.6* 5.4* 5.1* 3.0*  PROT 5.6* 6.0* 4.8* 5.2* 4.6*  ALBUMIN  --  2.6* 2.0* 2.0* 1.8*   Recent Labs  Lab 05/28/19 1151  LIPASE 30   Recent Labs  Lab 05/29/19 0221  AMMONIA 51*   Coagulation Profile: Recent Labs  Lab 05/28/19 1330  INR 1.1   Cardiac Enzymes: No results for input(s): CKTOTAL, CKMB, CKMBINDEX, TROPONINI in the last 168 hours. BNP (last 3 results) No results for input(s): PROBNP in the last 8760 hours. HbA1C: No results for input(s): HGBA1C in the last 72 hours. CBG: Recent Labs  Lab 05/29/19 1603 05/29/19 2007 05/29/19 2344 05/30/19 0352 05/30/19 0742  GLUCAP 284* 202* 169* 150* 136*   Lipid Profile: No results for input(s): CHOL, HDL, LDLCALC, TRIG, CHOLHDL, LDLDIRECT in the last 72 hours. Thyroid Function Tests: No results for input(s): TSH, T4TOTAL, FREET4, T3FREE, THYROIDAB in the last 72 hours. Anemia Panel: Recent Labs    05/29/19 1207  VITAMINB12 349  FOLATE 25.8  FERRITIN 359*  TIBC 178*  IRON 20*  RETICCTPCT 2.2   Sepsis Labs: Recent Labs  Lab 05/28/19 1330  LATICACIDVEN 1.9    Recent Results (from the past 240 hour(s))  Blood Culture (routine x  2)     Status: None (Preliminary result)   Collection Time: 05/28/19  1:30 PM  Specimen: BLOOD RIGHT FOREARM  Result Value Ref Range Status   Specimen Description BLOOD RIGHT FOREARM  Final   Special Requests   Final    BOTTLES DRAWN AEROBIC AND ANAEROBIC Blood Culture adequate volume   Culture   Final    NO GROWTH < 24 HOURS Performed at [facility], [place] [address], [place], [name] [address]    Report Status PENDING  Incomplete  Blood Culture (routine x 2)     Status: None (Preliminary result)   Collection Time: 05/28/19  6:22 PM   Specimen: BLOOD LEFT HAND  Result Value Ref Range Status   Specimen Description BLOOD LEFT HAND  Final   Special Requests   Final    BOTTLES DRAWN AEROBIC ONLY Blood Culture results may not be optimal due to an inadequate volume of blood received in culture bottles   Culture   Final    NO GROWTH < 12 HOURS Performed at [facility], [place] [address] [place], [place] [address]    Report Status PENDING  Incomplete  Urine culture     Status: Abnormal (Preliminary result)   Collection Time: 05/28/19  6:31 PM   Specimen: In/Out Cath Urine  Result Value Ref Range Status   Specimen Description IN/OUT CATH URINE  Final   Special Requests NONE  Final   Culture (A)  Final    30,000 COLONIES/mL KLEBSIELLA PNEUMONIAE SUSCEPTIBILITIES TO FOLLOW Performed at [facility], [place] [address] [place], [place] [address]    Report Status PENDING  Incomplete  SARS CORONAVIRUS 2 (TAT 6-24 HRS) Nasopharyngeal Nasopharyngeal Swab     Status: None   Collection Time: 05/28/19  7:37 PM   Specimen: Nasopharyngeal Swab  Result Value Ref Range Status   SARS Coronavirus 2 NEGATIVE NEGATIVE Final    Comment: (NOTE) SARS-CoV-2 target nucleic acids are NOT DETECTED. The SARS-CoV-2 RNA is generally detectable in upper and lower respiratory specimens during the acute phase of infection. Negative results do not preclude SARS-CoV-2 infection, do not rule  out co-infections with other pathogens, and should not be used as the sole basis for treatment or other patient management decisions. Negative results must be combined with clinical observations, patient history, and epidemiological information. The expected result is Negative. Fact Sheet for Patients: SugarRoll.be Fact Sheet for Healthcare Providers: https://www.woods-mathews.com/ This test is not yet approved or cleared by the [place] FDA and  has been authorized for detection and/or diagnosis of SARS-CoV-2 by FDA under an Emergency Use Authorization (EUA). This EUA will remain  in effect (meaning this test can be used) for the duration of the COVID-19 declaration under Section 56 4(b)(1) of the Act, 21 U.S.C. section 360bbb-3(b)(1), unless the authorization is terminated or revoked sooner. Performed at [facility], [place] [address] [place], [place] [address]       Radiology Studies: CT ABDOMEN PELVIS WO CONTRAST  Result Date: 05/28/2019 CLINICAL DATA:  Elevated liver function test, generalized abdominal pain. EXAM: CT ABDOMEN AND PELVIS WITHOUT CONTRAST TECHNIQUE: Multidetector CT imaging of the abdomen and pelvis was performed following the standard protocol without IV contrast. COMPARISON:  March 11, 2019. FINDINGS: Lower chest: Mild right middle lobe subsegmental atelectasis or infiltrate is noted. Hepatobiliary: Mild cholelithiasis is noted. No biliary dilatation is noted. No focal abnormality is seen in the liver on these unenhanced images. Pancreas: Unremarkable. No pancreatic ductal dilatation or surrounding inflammatory changes. Spleen: Calcified splenic granulomata are noted. Adrenals/Urinary Tract: Adrenal glands are unremarkable. Kidneys  are normal, without renal calculi, focal lesion, or hydronephrosis. Bladder is unremarkable. Stomach/Bowel: Stomach is within normal limits. Appendix appears normal. No evidence of bowel  wall thickening, distention, or inflammatory changes. Vascular/Lymphatic: Aortic atherosclerosis. No enlarged abdominal or pelvic lymph nodes. Reproductive: Prostate is unremarkable. Other: No abdominal wall hernia or abnormality. No abdominopelvic ascites. Musculoskeletal: No acute or significant osseous findings. IMPRESSION: Cholelithiasis. Mild right middle lobe subsegmental atelectasis or infiltrate is noted. Aortic Atherosclerosis (ICD10-I70.0). Electronically Signed   By: Marijo Conception M.D.   On: 05/28/2019 14:50   DG Chest 2 View  Result Date: 05/28/2019 CLINICAL DATA:  Shortness of breath. Elevated blood sugar. Hypertension. CHF. EXAM: CHEST - 2 VIEW COMPARISON:  03/11/2019 FINDINGS: Minimally motion degraded lateral view. Patient rotated left on the frontal radiograph. Borderline cardiomegaly. Atherosclerosis in the transverse aorta. No pleural effusion or pneumothorax. No congestive failure. Clear lungs. IMPRESSION: No acute cardiopulmonary disease. Aortic Atherosclerosis (ICD10-I70.0). Electronically Signed   By: Abigail Miyamoto M.D.   On: 05/28/2019 12:15   CT Head Wo Contrast  Result Date: 05/28/2019 CLINICAL DATA:  Hyperglycemia, encephalopathy EXAM: CT HEAD WITHOUT CONTRAST TECHNIQUE: Contiguous axial images were obtained from the base of the skull through the vertex without intravenous contrast. COMPARISON:  10/25/2015 FINDINGS: Brain: Brain atrophy and chronic white matter microvascular ischemic changes throughout both cerebral hemispheres. No acute intracranial hemorrhage, new mass lesion, acute infarction, midline shift, herniation, hydrocephalus, or extra-axial fluid collection. No focal mass effect or edema. Cisterns are patent. Cerebellar atrophy as well. Vascular: No hyperdense vessel or unexpected calcification. Skull: Normal. Negative for fracture or focal lesion. Sinuses/Orbits: Minor maxillary mucosal thickening. No sinus air-fluid level or significant opacification. Orbits are  symmetric. No acute finding. Other: None. IMPRESSION: Stable atrophy and chronic white matter microvascular ischemic changes. No acute intracranial abnormality by noncontrast CT. Electronically Signed   By: Jerilynn Mages.  Shick M.D.   On: 05/28/2019 14:46   MR ABDOMEN MRCP WO CONTRAST  Result Date: 05/28/2019 CLINICAL DATA:  Rule out CBD stone EXAM: MRI ABDOMEN WITHOUT CONTRAST  (INCLUDING MRCP) TECHNIQUE: Multiplanar multisequence MR imaging of the abdomen was performed. Heavily T2-weighted images of the biliary and pancreatic ducts were obtained, and three-dimensional MRCP images were rendered by post processing. COMPARISON:  CT abdomen pelvis, 05/27/2018 FINDINGS: Examination is generally limited by patient motion and breath motion artifact. By technologist report, patient was agitated and yelling in scanner and unable to complete exam. Lower chest: No acute findings. Hepatobiliary: No mass or other parenchymal abnormality identified. Multiple small gallstones in the dependent gallbladder. No biliary ductal dilatation or common bile duct calculus. Pancreas: No mass, inflammatory changes, or other parenchymal abnormality identified. Spleen:  Within normal limits in size and appearance. Adrenals/Urinary Tract: No masses identified. No evidence of hydronephrosis. Stomach/Bowel: Susceptibility artifact in the gastric body secondary to endoscopically placed clip. Visualized portions within the abdomen are unremarkable. Vascular/Lymphatic: No pathologically enlarged lymph nodes identified. No abdominal aortic aneurysm demonstrated. Other:  None. Musculoskeletal: No suspicious bone lesions identified. IMPRESSION: Examination is generally limited by patient motion and breath motion artifact. By technologist report, patient was agitated and yelling in scanner and unable to complete exam. Within this limitation, cholelithiasis without evidence of acute cholecystitis. No evidence of common bile duct calculus. No biliary ductal  dilatation. Electronically Signed   By: Eddie Candle M.D.   On: 05/28/2019 20:02      LOS: 2 days   Time spent: More than 50% of that time was spent in counseling and/or coordination of  care.  Antonieta Pert, MD [facility]  05/30/2019, 8:24 AM

## 2019-05-31 LAB — COMPREHENSIVE METABOLIC PANEL
ALT: 142 U/L — ABNORMAL HIGH (ref 0–44)
AST: 84 U/L — ABNORMAL HIGH (ref 15–41)
Albumin: 1.7 g/dL — ABNORMAL LOW (ref 3.5–5.0)
Alkaline Phosphatase: 654 U/L — ABNORMAL HIGH (ref 38–126)
Anion gap: 9 (ref 5–15)
BUN: 50 mg/dL — ABNORMAL HIGH (ref 8–23)
CO2: 21 mmol/L — ABNORMAL LOW (ref 22–32)
Calcium: 7.8 mg/dL — ABNORMAL LOW (ref 8.9–10.3)
Chloride: 107 mmol/L (ref 98–111)
Creatinine, Ser: 2.55 mg/dL — ABNORMAL HIGH (ref 0.44–1.00)
GFR calc Af Amer: 21 mL/min — ABNORMAL LOW (ref >60)
GFR calc non Af Amer: 19 mL/min — ABNORMAL LOW (ref >60)
Glucose, Bld: 197 mg/dL — ABNORMAL HIGH (ref 70–99)
Potassium: 3.3 mmol/L — ABNORMAL LOW (ref 3.5–5.1)
Sodium: 137 mmol/L (ref 135–145)
Total Bilirubin: 1.3 mg/dL — ABNORMAL HIGH (ref 0.3–1.2)
Total Protein: 4.3 g/dL — ABNORMAL LOW (ref 6.5–8.1)

## 2019-05-31 LAB — GLUCOSE, CAPILLARY
Glucose-Capillary: 212 mg/dL — ABNORMAL HIGH (ref 70–99)
Glucose-Capillary: 164 mg/dL — ABNORMAL HIGH (ref 70–99)
Glucose-Capillary: 389 mg/dL — ABNORMAL HIGH (ref 70–99)
Glucose-Capillary: 393 mg/dL — ABNORMAL HIGH (ref 70–99)
Glucose-Capillary: 475 mg/dL — ABNORMAL HIGH (ref 70–99)

## 2019-05-31 LAB — EPSTEIN-BARR VIRUS VCA, IGG: EBV VCA IgG: >600 U/mL — ABNORMAL HIGH (ref 0.0–17.9)

## 2019-05-31 LAB — CMV IGM: CMV IgM: <30 AU/mL (ref 0.0–29.9)

## 2019-05-31 LAB — EPSTEIN-BARR VIRUS VCA, IGM: EBV VCA IgM: <36 U/mL (ref 0.0–35.9)

## 2019-05-31 MED ORDER — PROCHLORPERAZINE EDISYLATE 10 MG/2ML IJ SOLN
5.0000 mg | Freq: Once | INTRAMUSCULAR | Status: AC
  Administered 2019-05-31: 5 mg via INTRAVENOUS
  Filled 2019-05-31: qty 2

## 2019-05-31 MED ORDER — INSULIN ASPART 100 UNIT/ML ~~LOC~~ SOLN
0.0000 [IU] | Freq: Three times a day (TID) | SUBCUTANEOUS | Status: DC
  Administered 2019-06-01: 11 [IU] via SUBCUTANEOUS
  Administered 2019-06-01 (×2): 15 [IU] via SUBCUTANEOUS
  Administered 2019-06-02: 7 [IU] via SUBCUTANEOUS
  Administered 2019-06-02: 15 [IU] via SUBCUTANEOUS
  Administered 2019-06-02 – 2019-06-03 (×2): 4 [IU] via SUBCUTANEOUS
  Administered 2019-06-03: 7 [IU] via SUBCUTANEOUS

## 2019-05-31 MED ORDER — INSULIN ASPART 100 UNIT/ML ~~LOC~~ SOLN
15.0000 [IU] | Freq: Once | SUBCUTANEOUS | Status: AC
  Administered 2019-05-31: 15 [IU] via SUBCUTANEOUS

## 2019-05-31 MED ORDER — POTASSIUM CHLORIDE 10 MEQ/100ML IV SOLN
10.0000 meq | INTRAVENOUS | Status: AC
  Administered 2019-05-31 (×2): 10 meq via INTRAVENOUS
  Filled 2019-05-31 (×2): qty 100

## 2019-05-31 NOTE — Progress Notes (Signed)
PROGRESS NOTE    Susan [name]  QQP:[id number] DOB: 1951-03-02 DOA: 05/28/2019 PCP: Alycia Rossetti, MD   Brief Narrative: As per HPI:  69 y.o.femalewith medical historyobtained from medical record secondary patient's encephalopathy in the ER. 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 Patient appears to be altered unable to find much meaningful history. When asked if she had fevers she reported yes and her temperature was 305. Was provided additional information to the ER indicating that her blood sugar has been mild to moderately elevated and she has ALP.  Confused for 1 to 2 days. I was unable to obtain any meaningful information from the patient other than she has some abdominal pain primarily located in the epigastric region she was unable to provide a severity or quantify it simply reporting it was hurting. She did report the ongoing for the last several days which was confirmed by her husband did not report any significant changes in dietary intake, travel, or infectious exposures Patient was admitted, followed by gastroenterology.  Subjective:  Awoke up on calling, answered appropriately. 'I am here due to gall bladder problems" No fever,vomiting. Has some mid abdomen pain and some nausea Not eating much, on diabetic diet from FLD 1/4 Assessment & Plan:   Abnormal LFTs: Common bile duct obstruction versus underlying liver disease versus viral hepatitis versus sequelae of Covid infection. Appreciate GI input undergoing extensive work-up.CT abdomen that showed cholelithiasis, MRCP showed cholelithiasis without evidence of cholecystitis or choledocholithiasis. AST and ALT improving but no significant change in AP. Ultrasound abdomen with Doppler no acute finding. serology work-up with ANA, SMA,  IgG, ceruloplasmin, iron, GGT PER GI.  GI suspecting could be due to drug-induced liver injury or even ischemic hepatopathy.supportive care.  Monitor LFTs. Cont carbohydrate diet cont plan as per GI.  On empiric cefepime and Flagyl.  off vancomycin. Recent Labs  Lab 05/28/19 1151 05/28/19 1330 05/29/19 0221 05/29/19 1207 05/30/19 0416 05/31/19 0619  AST 450*  --  225* 208* 149* 84*  ALT 363*  --  251* 240* 200* 142*  ALKPHOS 567*  --  554* 599* 593* 654*  BILITOT 6.6*  --  5.4* 5.1* 3.0* 1.3*  PROT 6.0*  --  4.8* 5.2* 4.6* 4.3*  ALBUMIN 2.6*  --  2.0* 2.0* 1.8* 1.7*  INR  --  1.1  --   --   --   --    Acute metabolic encephalopathy suspect multifactorial in the setting of poorly controlled diabetes, renal failure?  Developing sepsis, question underlying liver disease.  CT head was negative, neuro status is stable.  Today patient is much more alert awake and oriented.  Continue supportive care encourage PT OT.  Klebsiella pneumoniae un urine culture continue cefepime as #1.  Mild anemia likely chronic question dilutional, hemoglobin more or less stable.  History of upper GI bleed secondary to large HP gastric polyp in October 2020 while on Plavix and aspirin, for FOBT -1/2.  No signs of active GI bleeding.  B12 349, iron panel shows ferritin 359, iron 20. Recent Labs  Lab 05/28/19 1151 05/28/19 1405 05/28/19 1808 05/29/19 0221 05/30/19 0416  HGB 13.6 14.3 12.5 11.5* 11.3*  HCT 43.1 42.0 40.1 36.5 36.3    AKI on CKD Stage IV: His creatinine ranging from 1.9-2.4-monitor renal function. Bun/creat slightly uptrending- pt on ivf- will cont the same. Monitor I/o.  Discontinue vancomycin.  Creatinine down-trending. Monitor. Recent Labs  Lab 05/28/19 1151 05/28/19 1808 05/29/19 0221 05/30/19 0416 05/31/19 0619  BUN 42*  --  54* 57* 50*  CREATININE 2.41* 2.70* 2.78* 2.99* 2.55*   CAD, history of MI x2: On Plavix and aspirin.  Okay to resume Plavix as per GI. cont  Coreg.  Hyperlipemia: Hold statins for now.  Chronic diastolic heart failure/ trop  301->283-209. No chest pain/ liklely from aki/demand mismatch.monitor daily weights-On IV fluids in the setting of #1. Home torsemide and metolazone on hold Weight up at 272 pounds from 254 on admission. Will stop ivf.  PVD-resume home antiplatelets  Diabetes poorly controlled with hyperglycemia, last A1c 13.1 on 12/28.  Blood sugar 164- 389 poorly controlled.  Continue sliding scale insulin.  Icont lantus 20 units daily. . Recent Labs  Lab 05/30/19 1949 05/30/19 2336 05/31/19 0410 05/31/19 0743 05/31/19 1156  GLUCAP 395* 256* 212* 164* 389*    Hypokalemia:repleted  Morbid obesity with  Body mass index is 45.31 kg/m.  Need follow-up with PCP for weight loss healthy lifestyle.  DVT prophylaxis:Heparin Code Status: full Family Communication: plan of care discussed with patient at bedside. UPDATED DAUGHTER on the phone while in room Disposition Plan: Remains inpatient pending improvement in the liver function and once okay with GI.    Consultants: GI Procedures: NONE Microbiology: NONE  Antimicrobials: Anti-infectives (From admission, onward)   Start     Dose/Rate Route Frequency Ordered Stop   05/30/19 2000  vancomycin (VANCOREADY) IVPB 1250 mg/250 mL  Status:  Discontinued     1,250 mg 166.7 mL/hr over 90 Minutes Intravenous Every 48 hours 05/28/19 1743 05/30/19 1331   05/29/19 1700  ceFEPIme (MAXIPIME) 2 g in sodium chloride 0.9 % 100 mL IVPB     2 g 200 mL/hr over 30 Minutes Intravenous Every 24 hours 05/28/19 1747     05/29/19 0100  metroNIDAZOLE (FLAGYL) IVPB 500 mg     500 mg 100 mL/hr over 60 Minutes Intravenous Every 8 hours 05/28/19 1744     05/28/19 1600  ceFEPIme (MAXIPIME) 2 g in sodium chloride 0.9 % 100 mL IVPB     2 g 200 mL/hr over 30 Minutes Intravenous  Once 05/28/19 1553 05/28/19 1713   05/28/19 1600  metroNIDAZOLE (FLAGYL) IVPB 500 mg     500 mg 100 mL/hr over 60  Minutes Intravenous  Once 05/28/19 1553 05/28/19 1750   05/28/19 1600  vancomycin (VANCOCIN) IVPB 1000 mg/200 mL premix  Status:  Discontinued     1,000 mg 200 mL/hr over 60 Minutes Intravenous  Once 05/28/19 1553 05/28/19 1557   05/28/19 1600  vancomycin (VANCOREADY) IVPB 2000 mg/400 mL     2,000 mg 200 mL/hr over 120 Minutes Intravenous  Once 05/28/19 1557 05/30/19 0651       Objective: Vitals:   05/30/19 2219 05/31/19 0413 05/31/19 0416 05/31/19 0930  BP: (!) 114/58 (!) 130/56    Pulse: 71 75    Resp: 16 14    Temp: 97.8 F (36.6 C) 97.7 F (36.5 C)    TempSrc: Oral Oral    SpO2: 98% 94%    Weight:   122.7 kg 123.5 kg  Height:        Intake/Output Summary (Last 24 hours) at 05/31/2019 1038 Last data filed at 05/31/2019 0900 Gross per 24 hour  Intake 1239.43 ml  Output 2150 ml  Net -910.57 ml   Filed Weights   05/30/19 0500 05/31/19 0416 05/31/19 0930  Weight: 122 kg 122.7 kg 123.5 kg   Weight change: 0.7 kg  Body mass index is 45.31 kg/m.  Intake/Output from previous day: 01/04 0701 - 01/05 0700 In: 999.4 [P.O.:240; I.V.:659.4; IV Piggyback:100] Out: 1450 [Urine:1450] Intake/Output this shift: Total I/O In: 240 [P.O.:240] Out: 700 [Urine:700]  Examination:  General exam: Alert awake oriented to place people month year, obese, HEENT:Oral mucosa moist, Ear/Nose WNL grossly, dentition normal. Respiratory system:Diminished at the base,no wheezing or crackles,no use of accessory muscle. Cardiovascular system: S1 & S2 +, No JVD,. Gastrointestinal system: Abdomen soft, distended obese bowel sounds present NT Nervous System: Alert awake and nonfocal on exam.   Extremities: Non pitting edema + B/L le.Rt TMA stump, distal peripheral pulses palpable.  Skin: No rashes,no icterus. MSK: Normal muscle bulk,tone, power  Medications:  Scheduled Meds: . aspirin EC  81 mg Oral Daily  . carvedilol  6.25 mg Oral BID WC  . clopidogrel  75 mg Oral Daily  . escitalopram  20  mg Oral Daily  . ferrous sulfate  325 mg Oral BID WC  . heparin  5,000 Units Subcutaneous Q8H  . insulin aspart  0-20 Units Subcutaneous Q4H  . insulin glargine  20 Units Subcutaneous Daily  . levothyroxine  50 mcg Oral QAC breakfast  . pantoprazole  40 mg Oral Daily  . pregabalin  100 mg Oral BID   Continuous Infusions: . sodium chloride 75 mL/hr at 05/31/19 0822  . ceFEPime (MAXIPIME) IV 2 g (05/30/19 1752)  . metronidazole 500 mg (05/31/19 0858)    Data Reviewed: I have personally reviewed following labs and imaging studies  CBC: Recent Labs  Lab 05/28/19 1151 05/28/19 1405 05/28/19 1808 05/29/19 0221 05/30/19 0416  WBC 8.6  --  6.6 5.5 5.2  NEUTROABS  --   --   --  4.7  --   HGB 13.6 14.3 12.5 11.5* 11.3*  HCT 43.1 42.0 40.1 36.5 36.3  MCV 81.3  --  83.0 81.5 83.1  PLT 170  --  149* 143* 761   Basic Metabolic Panel: Recent Labs  Lab 05/28/19 1151 05/28/19 1405 05/28/19 1808 05/29/19 0221 05/30/19 0416 05/31/19 0619  NA 135 134*  --  136 135 137  K 3.2* 4.4  --  3.5 3.1* 3.3*  CL 97*  --   --  104 103 107  CO2 25  --   --  18* 23 21*  GLUCOSE 450*  --   --  410* 170* 197*  BUN 42*  --   --  54* 57* 50*  CREATININE 2.41*  --  2.70* 2.78* 2.99* 2.55*  CALCIUM 8.8*  --   --  8.1* 7.9* 7.8*   GFR: Estimated Creatinine Clearance: 27.5 mL/min (A) (by C-G formula based on SCr of 2.55 mg/dL (H)). Liver Function Tests: Recent Labs  Lab 05/28/19 1151 05/29/19 0221 05/29/19 1207 05/30/19 0416 05/31/19 0619  AST 450* 225* 208* 149* 84*  ALT 363* 251* 240* 200* 142*  ALKPHOS 567* 554* 599* 593* 654*  BILITOT 6.6* 5.4* 5.1* 3.0* 1.3*  PROT 6.0* 4.8* 5.2* 4.6* 4.3*  ALBUMIN 2.6* 2.0* 2.0* 1.8* 1.7*   Recent Labs  Lab 05/28/19 1151  LIPASE 30   Recent Labs  Lab 05/29/19 0221  AMMONIA 51*   Coagulation Profile: Recent Labs  Lab 05/28/19 1330  INR 1.1   Cardiac Enzymes: No results for input(s): CKTOTAL, CKMB, CKMBINDEX, TROPONINI in the last 168  hours. BNP (last 3 results) No results for input(s): PROBNP  in the last 8760 hours. HbA1C: No results for input(s): HGBA1C in the last 72 hours. CBG: Recent Labs  Lab 05/30/19 1636 05/30/19 1949 05/30/19 2336 05/31/19 0410 05/31/19 0743  GLUCAP 302* 395* 256* 212* 164*   Lipid Profile: No results for input(s): CHOL, HDL, LDLCALC, TRIG, CHOLHDL, LDLDIRECT in the last 72 hours. Thyroid Function Tests: No results for input(s): TSH, T4TOTAL, FREET4, T3FREE, THYROIDAB in the last 72 hours. Anemia Panel: Recent Labs    05/29/19 1207  VITAMINB12 349  FOLATE 25.8  FERRITIN 359*  TIBC 178*  IRON 20*  RETICCTPCT 2.2   Sepsis Labs: Recent Labs  Lab 05/28/19 1330  LATICACIDVEN 1.9    Recent Results (from the past 240 hour(s))  Blood Culture (routine x 2)     Status: None (Preliminary result)   Collection Time: 05/28/19  1:30 PM   Specimen: BLOOD RIGHT FOREARM  Result Value Ref Range Status   Specimen Description BLOOD RIGHT FOREARM  Final   Special Requests   Final    BOTTLES DRAWN AEROBIC AND ANAEROBIC Blood Culture adequate volume   Culture   Final    NO GROWTH 2 DAYS Performed at [facility], [place] [address], [place], [place] [address]    Report Status PENDING  Incomplete  Blood Culture (routine x 2)     Status: None (Preliminary result)   Collection Time: 05/28/19  6:22 PM   Specimen: BLOOD LEFT HAND  Result Value Ref Range Status   Specimen Description BLOOD LEFT HAND  Final   Special Requests   Final    BOTTLES DRAWN AEROBIC ONLY Blood Culture results may not be optimal due to an inadequate volume of blood received in culture bottles   Culture   Final    NO GROWTH 2 DAYS Performed at [facility], [place] [address] [place], [place] [address]    Report Status PENDING  Incomplete  Urine culture     Status: Abnormal   Collection Time: 05/28/19  6:31 PM   Specimen: In/Out Cath Urine  Result Value Ref Range Status   Specimen Description IN/OUT CATH  URINE  Final   Special Requests   Final    NONE Performed at [facility], [place] [address] [place], [place] [address]    Culture 30,000 COLONIES/mL KLEBSIELLA PNEUMONIAE (A)  Final   Report Status 05/30/2019 FINAL  Final   Organism ID, Bacteria KLEBSIELLA PNEUMONIAE (A)  Final      Susceptibility   Klebsiella pneumoniae - MIC*    AMPICILLIN RESISTANT Resistant     CEFAZOLIN <=4 SENSITIVE Sensitive     CEFTRIAXONE <=0.25 SENSITIVE Sensitive     CIPROFLOXACIN <=0.25 SENSITIVE Sensitive     GENTAMICIN <=1 SENSITIVE Sensitive     IMIPENEM <=0.25 SENSITIVE Sensitive     NITROFURANTOIN 128 RESISTANT Resistant     TRIMETH/SULFA <=20 SENSITIVE Sensitive     AMPICILLIN/SULBACTAM 4 SENSITIVE Sensitive     PIP/TAZO <=4 SENSITIVE Sensitive     * 30,000 COLONIES/mL KLEBSIELLA PNEUMONIAE  SARS CORONAVIRUS 2 (TAT 6-24 HRS) Nasopharyngeal Nasopharyngeal Swab     Status: None   Collection Time: 05/28/19  7:37 PM   Specimen: Nasopharyngeal Swab  Result Value Ref Range Status   SARS Coronavirus 2 NEGATIVE NEGATIVE Final    Comment: (NOTE) SARS-CoV-2 target nucleic acids are NOT DETECTED. The SARS-CoV-2 RNA is generally detectable in upper and lower respiratory specimens during the acute phase of infection. Negative results do not preclude SARS-CoV-2 infection,  do not rule out co-infections with other pathogens, and should not be used as the sole basis for treatment or other patient management decisions. Negative results must be combined with clinical observations, patient history, and epidemiological information. The expected result is Negative. Fact Sheet for Patients: SugarRoll.be Fact Sheet for Healthcare Providers: https://www.woods-mathews.com/ This test is not yet approved or cleared by the [place] FDA and  has been authorized for detection and/or diagnosis of SARS-CoV-2 by FDA under an Emergency Use Authorization (EUA). This EUA will  remain  in effect (meaning this test can be used) for the duration of the COVID-19 declaration under Section 56 4(b)(1) of the Act, 21 U.S.C. section 360bbb-3(b)(1), unless the authorization is terminated or revoked sooner. Performed at [facility], [place] [address], [place], [place] [address]       Radiology Studies: US LIVER DOPPLER  Result Date: 05/30/2019 CLINICAL DATA:  Elevated LFTs EXAM: DUPLEX ULTRASOUND OF LIVER TECHNIQUE: Color and duplex Doppler ultrasound was performed to evaluate the hepatic in-flow and out-flow vessels. COMPARISON:  05/28/2019 FINDINGS: Portal Vein Velocities Main:  31 cm/sec Right:  22 cm/sec Left:  23 cm/sec Hepatic Vein Velocities Right:  34 cm/sec Middle:  53 cm/sec Left:  32 cm/sec Hepatic Artery Velocity:  253 cm/sec Splenic Vein Velocity:  34 cm/sec Varices: Not visualized Ascites: Absent Patent portal, hepatic and splenic veins with normal directional flow. Negative for portal vein occlusion or thrombus. Cholelithiasis evident. Mild splenic enlargement measuring 12.3 x 15 x 4.3 cm. Splenic volume 423 cc. IMPRESSION: Normal hepatic venous Doppler. Negative for portal vein occlusion or thrombus Cholelithiasis Mild splenomegaly Electronically Signed   By: Jerilynn Mages.  Shick M.D.   On: 05/30/2019 13:02      LOS: 3 days   Time spent: More than 50% of that time was spent in counseling and/or coordination of care.  Antonieta Pert, MD [facility]  05/31/2019, 10:38 AM

## 2019-05-31 NOTE — Progress Notes (Signed)
Inpatient Diabetes Program Recommendations  AACE/ADA: New Consensus Statement on Inpatient Glycemic Control   Target Ranges:  Prepandial:   less than 140 mg/dL      Peak postprandial:   less than 180 mg/dL (1-2 hours)      Critically ill patients:  140 - 180 mg/dL   Results for Susan [name], SILOS (MRN [id number]) as of 05/31/2019 11:19  Ref. Range 05/30/2019 07:42 05/30/2019 12:01 05/30/2019 16:36 05/30/2019 19:49 05/30/2019 23:36 05/31/2019 04:10 05/31/2019 07:43  Glucose-Capillary Latest Ref Range: 70 - 99 mg/dL 136 (H) 215 (H) 302 (H) 395 (H) 256 (H) 212 (H) 164 (H)   Review of Glycemic Control  Diabetes history: DM2 Outpatient Diabetes medications: NPH 15 units QAM if CBGs >300 or 10 units if CBG 200-300 mg/dl, OmniPod with Novolin Regular (Manually add bolus to continuous dose via OmniPod 3 times daily per sliding scale: CBG 80-150 7 units, 151-200 9 units, 201-250 12 units, 251-300 14 units, 301-400 17 units) Current orders for Inpatient glycemic control: Lantus 20 units daily, Novolog 0-20 units Q4H  Inpatient Diabetes Program Recommendations:   Insulin-Meal Coverage: Please consider ordering Novolog 4 units TID with meals for meal coverage if patient eats at least 50% of meals.  NOTE: Per chart review, noted inpatient diabetes coordinator spoke with patient on 03/12/19 during prior hospital admission. Per note on 03/12/19 by J.Rosebud Poles, RN, Diabetes Coordinator, patient sees Dr. [name] (Endocrinologist) and she take NPH for basal needs and uses the OmniPod insulin pump for boluses only.    Thanks, Barnie Alderman, RN, MSN, CDE Diabetes Coordinator Inpatient Diabetes Program [phone] (Team Pager from 8am to 5pm)

## 2019-05-31 NOTE — Progress Notes (Signed)
Subjective: Feeling better.  No complaints.  Objective: Vital signs in last 24 hours: Temp:  [97.6 F (36.4 C)-97.8 F (36.6 C)] 97.7 F (36.5 C) (01/05 0413) Pulse Rate:  [71-79] 75 (01/05 0413) Resp:  [14-19] 14 (01/05 0413) BP: (114-150)/(56-68) 130/56 (01/05 0413) SpO2:  [94 %-99 %] 94 % (01/05 0413) Weight:  [122.7 kg-123.5 kg] 123.5 kg (01/05 0930) Last BM Date: 05/28/19  Intake/Output from previous day: 01/04 0701 - 01/05 0700 In: 999.4 [P.O.:240; I.V.:659.4; IV Piggyback:100] Out: 1450 [Urine:1450] Intake/Output this shift: Total I/O In: 240 [P.O.:240] Out: 700 [Urine:700]  General appearance: alert and no distress GI: soft, non-tender; bowel sounds normal; no masses,  no organomegaly Extremities: no asterixis  Lab Results: Recent Labs    05/28/19 1808 05/29/19 0221 05/30/19 0416  WBC 6.6 5.5 5.2  HGB 12.5 11.5* 11.3*  HCT 40.1 36.5 36.3  PLT 149* 143* 155   BMET Recent Labs    05/29/19 0221 05/30/19 0416 05/31/19 0619  NA 136 135 137  K 3.5 3.1* 3.3*  CL 104 103 107  CO2 18* 23 21*  GLUCOSE 410* 170* 197*  BUN 54* 57* 50*  CREATININE 2.78* 2.99* 2.55*  CALCIUM 8.1* 7.9* 7.8*   LFT Recent Labs    05/29/19 1207 05/31/19 0619  PROT 5.2* 4.3*  ALBUMIN 2.0* 1.7*  AST 208* 84*  ALT 240* 142*  ALKPHOS 599* 654*  BILITOT 5.1* 1.3*  BILIDIR 3.4*  --   IBILI 1.7*  --    PT/INR Recent Labs    05/28/19 1330  LABPROT 13.7  INR 1.1   Hepatitis Panel No results for input(s): HEPBSAG, HCVAB, HEPAIGM, HEPBIGM in the last 72 hours. C-Diff No results for input(s): CDIFFTOX in the last 72 hours. Fecal Lactopherrin No results for input(s): FECLLACTOFRN in the last 72 hours.  Studies/Results: US LIVER DOPPLER  Result Date: 05/30/2019 CLINICAL DATA:  Elevated LFTs EXAM: DUPLEX ULTRASOUND OF LIVER TECHNIQUE: Color and duplex Doppler ultrasound was performed to evaluate the hepatic in-flow and out-flow vessels. COMPARISON:  05/28/2019 FINDINGS:  Portal Vein Velocities Main:  31 cm/sec Right:  22 cm/sec Left:  23 cm/sec Hepatic Vein Velocities Right:  34 cm/sec Middle:  53 cm/sec Left:  32 cm/sec Hepatic Artery Velocity:  253 cm/sec Splenic Vein Velocity:  34 cm/sec Varices: Not visualized Ascites: Absent Patent portal, hepatic and splenic veins with normal directional flow. Negative for portal vein occlusion or thrombus. Cholelithiasis evident. Mild splenic enlargement measuring 12.3 x 15 x 4.3 cm. Splenic volume 423 cc. IMPRESSION: Normal hepatic venous Doppler. Negative for portal vein occlusion or thrombus Cholelithiasis Mild splenomegaly Electronically Signed   By: Jerilynn Mages.  Shick M.D.   On: 05/30/2019 13:02    Medications:  Scheduled: . aspirin EC  81 mg Oral Daily  . carvedilol  6.25 mg Oral BID WC  . clopidogrel  75 mg Oral Daily  . escitalopram  20 mg Oral Daily  . ferrous sulfate  325 mg Oral BID WC  . heparin  5,000 Units Subcutaneous Q8H  . insulin aspart  0-20 Units Subcutaneous Q4H  . insulin glargine  20 Units Subcutaneous Daily  . levothyroxine  50 mcg Oral QAC breakfast  . pantoprazole  40 mg Oral Daily  . pregabalin  100 mg Oral BID   Continuous: . sodium chloride 75 mL/hr at 05/31/19 0822  . ceFEPime (MAXIPIME) IV 2 g (05/30/19 1752)  . metronidazole 500 mg (05/31/19 0858)  . potassium chloride 10 mEq (05/31/19 1220)    Assessment/Plan:  1) Cholestatic pattern of liver injury. 2) Cholelithiasis. 3) DM.   Her liver enzymes are improving, somewhat.  There is no significant change with her AP.  The current pattern can suggest drug-induced liver injury (DILI) or even ischemic hepatopathy, but the AST and ALT would be expected to be much higher.  She denies taking any new medications or herbal supplements before the onset of her symptoms.  ? Passing viral infection.  Plan: 1) Continue with supportive care. 2) Monitor liver enzymes.    LOS: 3 days   Susan [name] D 05/31/2019, 1:00 PM

## 2019-05-31 NOTE — Plan of Care (Signed)
  Problem: Elimination: Goal: Will not experience complications related to urinary retention Outcome: Progressing   Problem: Pain Managment: Goal: General experience of comfort will improve Outcome: Progressing   Problem: Skin Integrity: Goal: Risk for impaired skin integrity will decrease Outcome: Progressing   

## 2019-06-01 LAB — TYPE AND SCREEN
ABO/RH(D): A POS
Antibody Screen: POSITIVE
Donor AG Type: NEGATIVE
Donor AG Type: NEGATIVE
Unit division: 0
Unit division: 0

## 2019-06-01 LAB — COMPREHENSIVE METABOLIC PANEL
ALT: 121 U/L — ABNORMAL HIGH (ref 0–44)
AST: 104 U/L — ABNORMAL HIGH (ref 15–41)
Albumin: 1.8 g/dL — ABNORMAL LOW (ref 3.5–5.0)
Alkaline Phosphatase: 697 U/L — ABNORMAL HIGH (ref 38–126)
Anion gap: 9 (ref 5–15)
BUN: 50 mg/dL — ABNORMAL HIGH (ref 8–23)
CO2: 19 mmol/L — ABNORMAL LOW (ref 22–32)
Calcium: 7.6 mg/dL — ABNORMAL LOW (ref 8.9–10.3)
Chloride: 104 mmol/L (ref 98–111)
Creatinine, Ser: 2.45 mg/dL — ABNORMAL HIGH (ref 0.44–1.00)
GFR calc Af Amer: 23 mL/min — ABNORMAL LOW (ref >60)
GFR calc non Af Amer: 19 mL/min — ABNORMAL LOW (ref >60)
Glucose, Bld: 358 mg/dL — ABNORMAL HIGH (ref 70–99)
Potassium: 5.1 mmol/L (ref 3.5–5.1)
Sodium: 132 mmol/L — ABNORMAL LOW (ref 135–145)
Total Bilirubin: 2.3 mg/dL — ABNORMAL HIGH (ref 0.3–1.2)
Total Protein: 4.6 g/dL — ABNORMAL LOW (ref 6.5–8.1)

## 2019-06-01 LAB — RSV(RESPIRATORY SYNCYTIAL VIRUS) AB, BLOOD: RSV Ab: 1:8 {titer} — ABNORMAL HIGH

## 2019-06-01 LAB — CBC
HCT: 36.5 % (ref 36.0–46.0)
Hemoglobin: 11.4 g/dL — ABNORMAL LOW (ref 12.0–15.0)
MCH: 25.6 pg — ABNORMAL LOW (ref 26.0–34.0)
MCHC: 31.2 g/dL (ref 30.0–36.0)
MCV: 82 fL (ref 80.0–100.0)
Platelets: 143 10*3/uL — ABNORMAL LOW (ref 150–400)
RBC: 4.45 MIL/uL (ref 3.87–5.11)
RDW: 14.9 % (ref 11.5–15.5)
WBC: 6.5 10*3/uL (ref 4.0–10.5)
nRBC: 0 % (ref 0.0–0.2)

## 2019-06-01 LAB — BPAM RBC
Blood Product Expiration Date: 202101092359
Blood Product Expiration Date: 202101092359
Unit Type and Rh: 5100
Unit Type and Rh: 5100

## 2019-06-01 LAB — GLUCOSE, CAPILLARY
Glucose-Capillary: 280 mg/dL — ABNORMAL HIGH (ref 70–99)
Glucose-Capillary: 309 mg/dL — ABNORMAL HIGH (ref 70–99)
Glucose-Capillary: 310 mg/dL — ABNORMAL HIGH (ref 70–99)
Glucose-Capillary: 341 mg/dL — ABNORMAL HIGH (ref 70–99)
Glucose-Capillary: 341 mg/dL — ABNORMAL HIGH (ref 70–99)
Glucose-Capillary: 358 mg/dL — ABNORMAL HIGH (ref 70–99)
Glucose-Capillary: 361 mg/dL — ABNORMAL HIGH (ref 70–99)

## 2019-06-01 MED ORDER — INSULIN ASPART 100 UNIT/ML ~~LOC~~ SOLN
5.0000 [IU] | Freq: Three times a day (TID) | SUBCUTANEOUS | Status: DC
  Administered 2019-06-01 (×3): 5 [IU] via SUBCUTANEOUS

## 2019-06-01 MED ORDER — INSULIN GLARGINE 100 UNIT/ML ~~LOC~~ SOLN
40.0000 [IU] | Freq: Every day | SUBCUTANEOUS | Status: DC
  Administered 2019-06-01: 40 [IU] via SUBCUTANEOUS
  Filled 2019-06-01 (×2): qty 0.4

## 2019-06-01 MED ORDER — CEPHALEXIN 250 MG PO CAPS
250.0000 mg | ORAL_CAPSULE | Freq: Two times a day (BID) | ORAL | Status: AC
  Administered 2019-06-01 – 2019-06-03 (×5): 250 mg via ORAL
  Filled 2019-06-01 (×5): qty 1

## 2019-06-01 NOTE — Progress Notes (Signed)
Subjective: Since I last evaluated the patient, she seems to be doing fairly well.  She denies having any abdominal pain nausea vomiting.  Her transaminases have improved significantly but her alkaline phosphatase continues to remain high representing a cholestatic picture. Her IgG is elevated at 651 with a GGT of 327 iron of 20 ferritin of 359 and a folate of 25.8 alpha-1 antitrypsin level was 191 B12 349.  Her ANA is positive but I am not able to see a titer on this mitochondrial M2 antibody less than 20.  Objective: Vital signs in last 24 hours: Temp:  [98.2 F (36.8 C)-99.2 F (37.3 C)] 98.3 F (36.8 C) (01/06 0348) Pulse Rate:  [70-81] 81 (01/06 0348) Resp:  [14-18] 14 (01/06 0348) BP: (132-150)/(51-56) 142/51 (01/06 0348) SpO2:  [98 %] 98 % (01/06 0348) Weight:  [124.4 kg] 124.4 kg (01/06 0500) Last BM Date: 05/28/19  Intake/Output from previous day: 01/05 0701 - 01/06 0700 In: 2908 [P.O.:1680; I.V.:328; IV Piggyback:900] Out: 2200 [Urine:2200] Intake/Output this shift: No intake/output data recorded.  General appearance: alert, cooperative, appears stated age, fatigued and morbidly obese Resp: clear to auscultation bilaterally Cardio: regular rate and rhythm, S1, S2 normal, no murmur, click, rub or gallop GI: soft, non-tender; bowel sounds normal; no masses,  no organomegaly  Lab Results: Recent Labs    05/30/19 0416 06/01/19 0133  WBC 5.2 6.5  HGB 11.3* 11.4*  HCT 36.3 36.5  PLT 155 143*   BMET Recent Labs    05/30/19 0416 05/31/19 0619 06/01/19 0133  NA 135 137 132*  K 3.1* 3.3* 5.1  CL 103 107 104  CO2 23 21* 19*  GLUCOSE 170* 197* 358*  BUN 57* 50* 50*  CREATININE 2.99* 2.55* 2.45*  CALCIUM 7.9* 7.8* 7.6*   LFT Recent Labs    06/01/19 0133  PROT 4.6*  ALBUMIN 1.8*  AST 104*  ALT 121*  ALKPHOS 697*  BILITOT 2.3*   No results found.  Medications:  I have reviewed the patient's current medications. Assessment/Plan: 1) Abnormal LFTs with a  cholestatic pattern-IgG is elevated at 697 with a positive antinuclear antibody and alpha 1 antitrypsin bowel level of 191.  Iron of 20 with ferritin of 359 and folate of 25.8 B12 level of 349 a GGT of 327- All viral serologies are negative, Multifactorial.considering her multiple.medical problems. Will follow up on an OP basis.   2) Cholelithiasis. 3) AODM/Morbid obesity..  LOS: 4 days   [name] 06/01/2019, 1:42 PM

## 2019-06-01 NOTE — Plan of Care (Signed)

## 2019-06-01 NOTE — Progress Notes (Signed)
PROGRESS NOTE    Susan [name]  WEX:[id number] DOB: 01-Feb-1951 DOA: 05/28/2019 PCP: Alycia Rossetti, MD   Brief Narrative: As per HPI:  69 y.o.femalewith medical historyobtained from medical record secondary patient's encephalopathy in the ER. 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 Patient appears to be altered unable to find much meaningful history. When asked if she had fevers she reported yes and her temperature was 305. Was provided additional information to the ER indicating that her blood sugar has been mild to moderately elevated and she has ALP.  Confused for 1 to 2 days. I was unable to obtain any meaningful information from the patient other than she has some abdominal pain primarily located in the epigastric region she was unable to provide a severity or quantify it simply reporting it was hurting. She did report the ongoing for the last several days which was confirmed by her husband did not report any significant changes in dietary intake, travel, or infectious exposures Patient was admitted, followed by gastroenterology.  Subjective: Seen this morning she was alert awake oriented x3.  Denies any nausea vomiting. She was about to work with physical therapy. Her sugar has been poorly controlled.  Assessment & Plan:   Abnormal LFTs: Followed by GI undergoing extensive work-up.CT abdomen that showed cholelithiasis, MRCP showed cholelithiasis without evidence of cholecystitis or choledocholithiasis. AST and ALT improving but no significant change in AP. ast slightly up today.  Ultrasound abdomen with Doppler no acute finding. serology work-up with ANA, SMA, IgG, ceruloplasmin, iron, GGT PER GI.  GI suspecting could be due to drug-induced liver injury or even ischemic hepatopathy- but  lfts not so high. Cont upportive care.  Monitor LFTs. Cont carbohydrate diet cont plan as per GI.  On empiric cefepime and Flagyl-day 5. off vancomycin.  He has been afebrile white counts normal and blood culture no growth-stop antibiotics except to cover UTI Recent Labs  Lab 05/28/19 1330 05/29/19 0221 05/29/19 1207 05/30/19 0416 05/31/19 0619 06/01/19 0133  AST  --  225* 208* 149* 84* 104*  ALT  --  251* 240* 200* 142* 121*  ALKPHOS  --  554* 599* 593* 654* 697*  BILITOT  --  5.4* 5.1* 3.0* 1.3* 2.3*  PROT  --  4.8* 5.2* 4.6* 4.3* 4.6*  ALBUMIN  --  2.0* 2.0* 1.8* 1.7* 1.8*  INR 1.1  --   --   --   --   --    Acute metabolic encephalopathy suspect multifactorial mental status seems to have improved this time.CT head was negative, neuro status is stable.  Continue PT OT.  Klebsiella pneumoniae un urine culture: Complete antibiotics  Mild anemia likely chronic question dilutional, hemoglobin more or less stable.  History of upper GI bleed secondary to large HP gastric polyp in October 2020 while on Plavix and aspirin, for FOBT -1/2.  No signs of active GI bleeding.  B12 349, iron panel shows ferritin 359, iron 20. Recent Labs  Lab 05/28/19 1405 05/28/19 1808 05/29/19 0221 05/30/19 0416 06/01/19 0133  HGB 14.3 12.5 11.5* 11.3* 11.4*  HCT 42.0 40.1 36.5 36.3 36.5   AKI on CKD Stage IV: His creatinine ranging from 1.9-2.4-monitor renal function.  Renal function more or less stable.  Encourage oral intake. Monitor I/o.  Discontinue vancomycin.   Recent Labs  Lab 05/28/19 1151 05/28/19 1808 05/29/19 0221 05/30/19 0416  05/31/19 0619 06/01/19 0133  BUN 42*  --  54* 57* 50* 50*  CREATININE 2.41* 2.70* 2.78* 2.99* 2.55* 2.45*   CAD, history of MI x2: On Plavix and aspirin.  Okay to resume Plavix as per GI. Also on Coreg.  Hyperlipemia: Hold statins for now given abnormal LFTs.  Chronic diastolic heart failure/ trop  301->283-209. No chest pain/ liklely from aki/demand  mismatch.monitor daily weights-On IV fluids in the setting of #1. Home torsemide and metolazone on hold. Weight up at 272->274 pounds from 254 on admission. off ivf.  Resume diuretics.  PVD-resume home antiplatelets  Diabetes poorly controlled with hyperglycemia, last A1c 13.1 on 12/28.  Blood sugar 164- 389 poorly controlled.  Continue sliding scale insulin.  Add 5 units Premeal insulin and increase Lantus to 4o units daily. Normally patient on insulin pump at home and she has done well before coming to the hospital . Recent Labs  Lab 05/31/19 1949 06/01/19 0002 06/01/19 0345 06/01/19 0719 06/01/19 1143  GLUCAP 475* 309* 361* 341* 280*   Hypokalemia:repleted  Morbid obesity with  Body mass index is 45.64 kg/m.  Need follow-up with PCP for weight loss healthy lifestyle.  DVT prophylaxis:Heparin Code Status: full Family Communication: plan of care discussed with patient at bedside. UPDATED DAUGHTER on the phone while in room 1/5 Disposition Plan: Remains inpatient pending improvement in the liver function and disposition once okay with GI.  PT OT has advised inpatient rehab and consulted CIR.    Consultants: GI Procedures: NONE Microbiology: NONE  Antimicrobials: Anti-infectives (From admission, onward)   Start     Dose/Rate Route Frequency Ordered Stop   05/30/19 2000  vancomycin (VANCOREADY) IVPB 1250 mg/250 mL  Status:  Discontinued     1,250 mg 166.7 mL/hr over 90 Minutes Intravenous Every 48 hours 05/28/19 1743 05/30/19 1331   05/29/19 1700  ceFEPIme (MAXIPIME) 2 g in sodium chloride 0.9 % 100 mL IVPB     2 g 200 mL/hr over 30 Minutes Intravenous Every 24 hours 05/28/19 1747     05/29/19 0100  metroNIDAZOLE (FLAGYL) IVPB 500 mg     500 mg 100 mL/hr over 60 Minutes Intravenous Every 8 hours 05/28/19 1744     05/28/19 1600  ceFEPIme (MAXIPIME) 2 g in sodium chloride 0.9 % 100 mL IVPB     2 g 200 mL/hr over 30 Minutes Intravenous  Once 05/28/19 1553 05/28/19 1713    05/28/19 1600  metroNIDAZOLE (FLAGYL) IVPB 500 mg     500 mg 100 mL/hr over 60 Minutes Intravenous  Once 05/28/19 1553 05/28/19 1750   05/28/19 1600  vancomycin (VANCOCIN) IVPB 1000 mg/200 mL premix  Status:  Discontinued     1,000 mg 200 mL/hr over 60 Minutes Intravenous  Once 05/28/19 1553 05/28/19 1557   05/28/19 1600  vancomycin (VANCOREADY) IVPB 2000 mg/400 mL     2,000 mg 200 mL/hr over 120 Minutes Intravenous  Once 05/28/19 1557 05/30/19 0651       Objective: Vitals:   05/31/19 1507 05/31/19 1952 06/01/19 0348 06/01/19 0500  BP: (!) 132/52 (!) 150/56 (!) 142/51   Pulse: 70 78 81   Resp: 18 14 14    Temp: 98.2 F (36.8 C) 99.2 F (37.3 C) 98.3 F (36.8 C)   TempSrc: Oral Oral Oral   SpO2: 98% 98% 98%   Weight:    124.4 kg  Height:        Intake/Output Summary (Last 24 hours) at 06/01/2019 1212 Last data filed at 06/01/2019  0093 Gross per 24 hour  Intake 2427.95 ml  Output 1500 ml  Net 927.95 ml   Filed Weights   05/31/19 0416 05/31/19 0930 06/01/19 0500  Weight: 122.7 kg 123.5 kg 124.4 kg   Weight change: 0.8 kg  Body mass index is 45.64 kg/m.  Intake/Output from previous day: 01/05 0701 - 01/06 0700 In: 2908 [P.O.:1680; I.V.:328; IV Piggyback:900] Out: 2200 [Urine:2200] Intake/Output this shift: No intake/output data recorded.  Examination:  General exam: AAOx3, obese, not in acute distress.  HEENT:Oral mucosa moist, Ear/Nose WNL grossly, dentition normal. Respiratory system: Clear breath sounds bilaterally, no use of accessory muscle. Cardiovascular system: S1 & S2 +, No JVD,. Gastrointestinal system: Abdomen soft, obese/distended, bowel sounds present and nontender.  Nervous System: Alert awake and nonfocal on exam.   Extremities: Non pitting edema + B/L le.Rt TMA stump, distal peripheral pulses palpable.  Skin: No rashes,no icterus. MSK: Normal muscle bulk,tone, power  Medications:  Scheduled Meds: . aspirin EC  81 mg Oral Daily  . carvedilol   6.25 mg Oral BID WC  . clopidogrel  75 mg Oral Daily  . escitalopram  20 mg Oral Daily  . ferrous sulfate  325 mg Oral BID WC  . heparin  5,000 Units Subcutaneous Q8H  . insulin aspart  0-20 Units Subcutaneous TID WC  . insulin aspart  5 Units Subcutaneous TID WC  . insulin glargine  40 Units Subcutaneous Daily  . levothyroxine  50 mcg Oral QAC breakfast  . pantoprazole  40 mg Oral Daily  . pregabalin  100 mg Oral BID   Continuous Infusions: . ceFEPime (MAXIPIME) IV Stopped (05/31/19 1619)  . metronidazole 500 mg (06/01/19 1006)    Data Reviewed: I have personally reviewed following labs and imaging studies  CBC: Recent Labs  Lab 05/28/19 1151 05/28/19 1405 05/28/19 1808 05/29/19 0221 05/30/19 0416 06/01/19 0133  WBC 8.6  --  6.6 5.5 5.2 6.5  NEUTROABS  --   --   --  4.7  --   --   HGB 13.6 14.3 12.5 11.5* 11.3* 11.4*  HCT 43.1 42.0 40.1 36.5 36.3 36.5  MCV 81.3  --  83.0 81.5 83.1 82.0  PLT 170  --  149* 143* 155 818*   Basic Metabolic Panel: Recent Labs  Lab 05/28/19 1151 05/28/19 1405 05/28/19 1808 05/29/19 0221 05/30/19 0416 05/31/19 0619 06/01/19 0133  NA 135 134*  --  136 135 137 132*  K 3.2* 4.4  --  3.5 3.1* 3.3* 5.1  CL 97*  --   --  104 103 107 104  CO2 25  --   --  18* 23 21* 19*  GLUCOSE 450*  --   --  410* 170* 197* 358*  BUN 42*  --   --  54* 57* 50* 50*  CREATININE 2.41*  --  2.70* 2.78* 2.99* 2.55* 2.45*  CALCIUM 8.8*  --   --  8.1* 7.9* 7.8* 7.6*   GFR: Estimated Creatinine Clearance: 28.7 mL/min (A) (by C-G formula based on SCr of 2.45 mg/dL (H)). Liver Function Tests: Recent Labs  Lab 05/29/19 0221 05/29/19 1207 05/30/19 0416 05/31/19 0619 06/01/19 0133  AST 225* 208* 149* 84* 104*  ALT 251* 240* 200* 142* 121*  ALKPHOS 554* 599* 593* 654* 697*  BILITOT 5.4* 5.1* 3.0* 1.3* 2.3*  PROT 4.8* 5.2* 4.6* 4.3* 4.6*  ALBUMIN 2.0* 2.0* 1.8* 1.7* 1.8*   Recent Labs  Lab 05/28/19 1151  LIPASE 30   Recent Labs  Lab  05/29/19 0221    AMMONIA 51*   Coagulation Profile: Recent Labs  Lab 05/28/19 1330  INR 1.1   Cardiac Enzymes: No results for input(s): CKTOTAL, CKMB, CKMBINDEX, TROPONINI in the last 168 hours. BNP (last 3 results) No results for input(s): PROBNP in the last 8760 hours. HbA1C: No results for input(s): HGBA1C in the last 72 hours. CBG: Recent Labs  Lab 05/31/19 1949 06/01/19 0002 06/01/19 0345 06/01/19 0719 06/01/19 1143  GLUCAP 475* 309* 361* 341* 280*   Lipid Profile: No results for input(s): CHOL, HDL, LDLCALC, TRIG, CHOLHDL, LDLDIRECT in the last 72 hours. Thyroid Function Tests: No results for input(s): TSH, T4TOTAL, FREET4, T3FREE, THYROIDAB in the last 72 hours. Anemia Panel: No results for input(s): VITAMINB12, FOLATE, FERRITIN, TIBC, IRON, RETICCTPCT in the last 72 hours. Sepsis Labs: Recent Labs  Lab 05/28/19 1330  LATICACIDVEN 1.9    Recent Results (from the past 240 hour(s))  Blood Culture (routine x 2)     Status: None (Preliminary result)   Collection Time: 05/28/19  1:30 PM   Specimen: BLOOD RIGHT FOREARM  Result Value Ref Range Status   Specimen Description BLOOD RIGHT FOREARM  Final   Special Requests   Final    BOTTLES DRAWN AEROBIC AND ANAEROBIC Blood Culture adequate volume   Culture   Final    NO GROWTH 3 DAYS Performed at [facility], [place] [address] [place], [place] [address]    Report Status PENDING  Incomplete  Blood Culture (routine x 2)     Status: None (Preliminary result)   Collection Time: 05/28/19  6:22 PM   Specimen: BLOOD LEFT HAND  Result Value Ref Range Status   Specimen Description BLOOD LEFT HAND  Final   Special Requests   Final    BOTTLES DRAWN AEROBIC ONLY Blood Culture results may not be optimal due to an inadequate volume of blood received in culture bottles   Culture   Final    NO GROWTH 3 DAYS Performed at [facility], [place] [address] [place],  [address]    Report Status PENDING  Incomplete  Urine culture      Status: Abnormal   Collection Time: 05/28/19  6:31 PM   Specimen: In/Out Cath Urine  Result Value Ref Range Status   Specimen Description IN/OUT CATH URINE  Final   Special Requests   Final    NONE Performed at [facility], [place] [address], [place], [place] [address]    Culture 30,000 COLONIES/mL KLEBSIELLA PNEUMONIAE (A)  Final   Report Status 05/30/2019 FINAL  Final   Organism ID, Bacteria KLEBSIELLA PNEUMONIAE (A)  Final      Susceptibility   Klebsiella pneumoniae - MIC*    AMPICILLIN RESISTANT Resistant     CEFAZOLIN <=4 SENSITIVE Sensitive     CEFTRIAXONE <=0.25 SENSITIVE Sensitive     CIPROFLOXACIN <=0.25 SENSITIVE Sensitive     GENTAMICIN <=1 SENSITIVE Sensitive     IMIPENEM <=0.25 SENSITIVE Sensitive     NITROFURANTOIN 128 RESISTANT Resistant     TRIMETH/SULFA <=20 SENSITIVE Sensitive     AMPICILLIN/SULBACTAM 4 SENSITIVE Sensitive     PIP/TAZO <=4 SENSITIVE Sensitive     * 30,000 COLONIES/mL KLEBSIELLA PNEUMONIAE  SARS CORONAVIRUS 2 (TAT 6-24 HRS) Nasopharyngeal Nasopharyngeal Swab     Status: None   Collection Time: 05/28/19  7:37 PM   Specimen: Nasopharyngeal Swab  Result Value Ref Range Status   SARS Coronavirus 2 NEGATIVE NEGATIVE Final  Comment: (NOTE) SARS-CoV-2 target nucleic acids are NOT DETECTED. The SARS-CoV-2 RNA is generally detectable in upper and lower respiratory specimens during the acute phase of infection. Negative results do not preclude SARS-CoV-2 infection, do not rule out co-infections with other pathogens, and should not be used as the sole basis for treatment or other patient management decisions. Negative results must be combined with clinical observations, patient history, and epidemiological information. The expected result is Negative. Fact Sheet for Patients: SugarRoll.be Fact Sheet for Healthcare Providers: https://www.woods-mathews.com/ This test is not yet approved or cleared by the  [place] FDA and  has been authorized for detection and/or diagnosis of SARS-CoV-2 by FDA under an Emergency Use Authorization (EUA). This EUA will remain  in effect (meaning this test can be used) for the duration of the COVID-19 declaration under Section 56 4(b)(1) of the Act, 21 U.S.C. section 360bbb-3(b)(1), unless the authorization is terminated or revoked sooner. Performed at [facility], [place] [address] [place], [place] [address]       Radiology Studies: No results found.    LOS: 4 days   Time spent: More than 50% of that time was spent in counseling and/or coordination of care.  Antonieta Pert, MD [facility]  06/01/2019, 12:12 PM

## 2019-06-01 NOTE — Evaluation (Signed)
Physical Therapy Evaluation Patient Details Name: Susan [name] MRN: [id number] DOB: March 03, 1951 Today's Date: 06/01/2019   History of Present Illness  Admitted with Encephalopathy, encephalopathy, UTI, AKI on CKD, poorly controlled diabetes; PVD with RLE amputations; Per Gastroenterology, She possible passed a common bile duct stone verses underlying liver disease.  Clinical Impression   Pt admitted with above diagnosis. Managing independently in her household with RW for amb; Lives with husband who works days and son who is home around the clock and can give some assistance; Presents to PT with decr functional mobility, generalized weakness effecting independence and safety with mobility and ADLs;  Pt currently with functional limitations due to the deficits listed below (see PT Problem List). Pt will benefit from skilled PT to increase their independence and safety with mobility to allow discharge to the venue listed below.       Follow Up Recommendations CIR    Equipment Recommendations  None recommended by PT(Pretty well-equipped)    Recommendations for Other Services OT consult(will order per protocol)     Precautions / Restrictions Precautions Precautions: Fall      Mobility  Bed Mobility Overal bed mobility: Needs Assistance Bed Mobility: Rolling;Sidelying to Sit Rolling: Mod assist Sidelying to sit: Mod assist       General bed mobility comments: Cues for log roll for comfort; Light mod assist to elevate trunk to sit  Transfers Overall transfer level: Needs assistance Equipment used: Rolling walker (2 wheeled) Transfers: Sit to/from Stand Sit to Stand: Mod assist;+2 safety/equipment;+2 physical assistance         General transfer comment: Heavy mod bilateral support and assist to power up to stand  Ambulation/Gait Ambulation/Gait assistance: Mod assist;+2 physical assistance Gait Distance (Feet): (pivotal steps bed to chair) Assistive device: Rolling walker (2  wheeled) Gait Pattern/deviations: Shuffle     General Gait Details: Reports feeling weak; Good use of RW for support  Stairs            Wheelchair Mobility    Modified Rankin (Stroke Patients Only)       Balance                                             Pertinent Vitals/Pain Pain Assessment: Faces Faces Pain Scale: Hurts a little bit Pain Location: mild abdominal pain Pain Descriptors / Indicators: Aching Pain Intervention(s): Monitored during session    Home Living Family/patient expects to be discharged to:: Private residence Living Arrangements: Spouse/significant other Available Help at Discharge: Family;Available PRN/intermittently(husband works; son with mild disability is home) Type of Home: House Home Access: [place]: One [place]: Grab bars - tub/shower;Walker - 2 wheels;Walker - 4 wheels;Cane - single point;Wheelchair - manual      Prior Function Level of Independence: Independent with assistive device(s)         Comments: Per chart review, as of 03/12/2019, ambulates with RW. Pt reports multi falls.     Hand Dominance   Dominant Hand: Right    Extremity/Trunk Assessment   Upper Extremity Assessment Upper Extremity Assessment: Generalized weakness    Lower Extremity Assessment Lower Extremity Assessment: Generalized weakness(noted also limited hip ROM due to body habitus)       Communication   Communication: No difficulties  Cognition Arousal/Alertness: Awake/alert Behavior During Therapy: WFL for tasks assessed/performed Overall Cognitive Status: Within Functional  Limits for tasks assessed(for simple mobility tasks)                                        General Comments      Exercises     Assessment/Plan    PT Assessment Patient needs continued PT services  PT Problem List Decreased strength;Decreased range of motion;Decreased activity  tolerance;Decreased balance;Decreased mobility;Decreased coordination;Decreased knowledge of use of DME;Decreased safety awareness;Decreased knowledge of precautions;Decreased cognition;Obesity       PT Treatment Interventions DME instruction;Gait training;Functional mobility training;Therapeutic activities;Therapeutic exercise;Balance training;Neuromuscular re-education;Cognitive remediation;Patient/family education    PT Goals (Current goals can be found in the Care Plan section)  Acute Rehab PT Goals Patient Stated Goal: to be able to go home PT Goal Formulation: With patient Time For Goal Achievement: 06/15/19 Potential to Achieve Goals: Good    Frequency Min 3X/week   Barriers to discharge Other (comment) She must be near independent to be able to dc home; husband works days; her mildly disabled son is home around the clock and can provide some assist    Co-evaluation               AM-PAC PT "6 Clicks" Mobility  Outcome Measure Help needed turning from your back to your side while in a flat bed without using bedrails?: A Lot Help needed moving from lying on your back to sitting on the side of a flat bed without using bedrails?: A Lot Help needed moving to and from a bed to a chair (including a wheelchair)?: A Lot Help needed standing up from a chair using your arms (e.g., wheelchair or bedside chair)?: A Lot Help needed to walk in hospital room?: A Lot Help needed climbing 3-5 steps with a railing? : Total 6 Click Score: 11    End of Session Equipment Utilized During Treatment: Gait belt Activity Tolerance: Patient tolerated treatment well Patient left: in chair;with call bell/phone within reach;with chair alarm set Nurse Communication: Mobility status PT Visit Diagnosis: Unsteadiness on feet (R26.81);Other abnormalities of gait and mobility (R26.89);History of falling (Z91.81)    Time: 1638-4536 PT Time Calculation (min) (ACUTE ONLY): 26 min   Charges:   PT  Evaluation $PT Eval Moderate Complexity: 1 Mod PT Treatments $Therapeutic Activity: 8-22 mins        Roney Marion, PT  Acute Rehabilitation Services Pager [phone] Office [place] 06/01/2019, 11:59 AM

## 2019-06-01 NOTE — Progress Notes (Signed)
Rehab Admissions Coordinator Note:  Per PT recommendation, patient was screened by Michel Santee for appropriateness for an Inpatient Acute Rehab Consult.  At this time, we are recommending Inpatient Rehab consult.  Michel Santee 06/01/2019, 1:21 PM  I can be reached at [phone].

## 2019-06-01 NOTE — Progress Notes (Signed)
Inpatient Rehabilitation Admissions Coordinator  Inpatient rehab consult received. I will follow up with patient and family tomorrow for rehab assessment.  Danne Baxter, RN, MSN Rehab Admissions Coordinator [phone] 06/01/2019 3:55 PM

## 2019-06-02 ENCOUNTER — Encounter (HOSPITAL_COMMUNITY): Payer: Self-pay | Admitting: Internal Medicine

## 2019-06-02 LAB — CBC
HCT: 33 % — ABNORMAL LOW (ref 36.0–46.0)
Hemoglobin: 10.4 g/dL — ABNORMAL LOW (ref 12.0–15.0)
MCH: 25.6 pg — ABNORMAL LOW (ref 26.0–34.0)
MCHC: 31.5 g/dL (ref 30.0–36.0)
MCV: 81.1 fL (ref 80.0–100.0)
Platelets: 166 10*3/uL (ref 150–400)
RBC: 4.07 MIL/uL (ref 3.87–5.11)
RDW: 15.4 % (ref 11.5–15.5)
WBC: 7.4 10*3/uL (ref 4.0–10.5)
nRBC: 0 % (ref 0.0–0.2)

## 2019-06-02 LAB — COMPREHENSIVE METABOLIC PANEL
ALT: 97 U/L — ABNORMAL HIGH (ref 0–44)
AST: 83 U/L — ABNORMAL HIGH (ref 15–41)
Albumin: 1.7 g/dL — ABNORMAL LOW (ref 3.5–5.0)
Alkaline Phosphatase: 750 U/L — ABNORMAL HIGH (ref 38–126)
Anion gap: 7 (ref 5–15)
BUN: 48 mg/dL — ABNORMAL HIGH (ref 8–23)
CO2: 21 mmol/L — ABNORMAL LOW (ref 22–32)
Calcium: 7.9 mg/dL — ABNORMAL LOW (ref 8.9–10.3)
Chloride: 107 mmol/L (ref 98–111)
Creatinine, Ser: 2.16 mg/dL — ABNORMAL HIGH (ref 0.44–1.00)
GFR calc Af Amer: 26 mL/min — ABNORMAL LOW (ref >60)
GFR calc non Af Amer: 23 mL/min — ABNORMAL LOW (ref >60)
Glucose, Bld: 364 mg/dL — ABNORMAL HIGH (ref 70–99)
Potassium: 4.4 mmol/L (ref 3.5–5.1)
Sodium: 135 mmol/L (ref 135–145)
Total Bilirubin: 1 mg/dL (ref 0.3–1.2)
Total Protein: 4.6 g/dL — ABNORMAL LOW (ref 6.5–8.1)

## 2019-06-02 LAB — GLUCOSE, CAPILLARY
Glucose-Capillary: 335 mg/dL — ABNORMAL HIGH (ref 70–99)
Glucose-Capillary: 305 mg/dL — ABNORMAL HIGH (ref 70–99)
Glucose-Capillary: 194 mg/dL — ABNORMAL HIGH (ref 70–99)
Glucose-Capillary: 242 mg/dL — ABNORMAL HIGH (ref 70–99)
Glucose-Capillary: 296 mg/dL — ABNORMAL HIGH (ref 70–99)

## 2019-06-02 LAB — CULTURE, BLOOD (ROUTINE X 2)
Culture: NO GROWTH
Culture: NO GROWTH
Special Requests: ADEQUATE

## 2019-06-02 MED ORDER — METOLAZONE 2.5 MG PO TABS
2.5000 mg | ORAL_TABLET | ORAL | Status: DC
  Administered 2019-06-03: 2.5 mg via ORAL
  Filled 2019-06-02: qty 1

## 2019-06-02 MED ORDER — TORSEMIDE 20 MG PO TABS
60.0000 mg | ORAL_TABLET | Freq: Two times a day (BID) | ORAL | Status: DC
  Administered 2019-06-02 – 2019-06-03 (×2): 60 mg via ORAL
  Filled 2019-06-02 (×3): qty 3

## 2019-06-02 MED ORDER — INSULIN ASPART 100 UNIT/ML ~~LOC~~ SOLN
12.0000 [IU] | Freq: Three times a day (TID) | SUBCUTANEOUS | Status: DC
  Administered 2019-06-02 – 2019-06-03 (×5): 12 [IU] via SUBCUTANEOUS

## 2019-06-02 MED ORDER — INSULIN GLARGINE 100 UNIT/ML ~~LOC~~ SOLN
50.0000 [IU] | Freq: Every day | SUBCUTANEOUS | Status: DC
  Administered 2019-06-02 – 2019-06-03 (×2): 50 [IU] via SUBCUTANEOUS
  Filled 2019-06-02 (×2): qty 0.5

## 2019-06-02 MED ORDER — POLYETHYLENE GLYCOL 3350 17 G PO PACK
17.0000 g | PACK | Freq: Every day | ORAL | Status: DC
  Administered 2019-06-02 – 2019-06-03 (×2): 17 g via ORAL
  Filled 2019-06-02 (×2): qty 1

## 2019-06-02 NOTE — Evaluation (Signed)
Occupational Therapy Evaluation Patient Details Name: Susan [name] MRN: [id number] DOB: Aug 02, 1950 Today's Date: 06/02/2019    History of Present Illness Admitted with encephalopathy, UTI, AKI on CKD, poorly controlled diabetes; PVD with RLE amputations; Per Gastroenterology, She possible passed a common bile duct stone verses underlying liver disease.   Clinical Impression   Patient is a 69 year old female that lives at home with her spouse and son, with family available 24/7. Patient lives in a single level home with ramp entry. Currently patient is min A for functional transfers with verbal cues for body mechanics to maximize safety and set up assist for UB ADLs. Due to family support, home DME and set up recommend home health services at this time. Patient would benefit from continued acute OT services to maximize patient safety and independence with self care.    Follow Up Recommendations  Home health OT;Supervision/Assistance - 24 hour    Equipment Recommendations  None recommended by OT       Precautions / Restrictions Precautions Precautions: Fall Restrictions Weight Bearing Restrictions: No      Mobility Bed Mobility Overal bed mobility: Needs Assistance Bed Mobility: Supine to Sit     Supine to sit: Min guard;HOB elevated     General bed mobility comments: use of bed rails  Transfers Overall transfer level: Needs assistance Equipment used: Rolling walker (2 wheeled) Transfers: Sit to/from Stand Sit to Stand: Min assist         General transfer comment: verbal cues for body mechanics, min A to power up to standing    Balance Overall balance assessment: Needs assistance Sitting-balance support: No upper extremity supported;Feet supported Sitting balance-Leahy Scale: Good     Standing balance support: Bilateral upper extremity supported;During functional activity Standing balance-Leahy Scale: Poor Standing balance comment: reliant on external support, min  A safety                           ADL either performed or assessed with clinical judgement   ADL Overall ADL's : Needs assistance/impaired Eating/Feeding: Independent;Sitting   Grooming: Wash/dry face;Wash/dry hands;Oral care;Brushing hair;Set up;Sitting   Upper Body Bathing: Set up;Sitting   Lower Body Bathing: Moderate assistance;Sitting/lateral leans;Sit to/from stand   Upper Body Dressing : Set up;Sitting   Lower Body Dressing: Moderate assistance;Sitting/lateral leans;Sit to/from stand;Maximal assistance   Toilet Transfer: Minimal assistance;RW;BSC;Ambulation;Cueing for safety;Cueing for sequencing Toilet Transfer Details (indicate cue type and reason): simulated to recliner Toileting- Clothing Manipulation and Hygiene: Sitting/lateral lean;Sit to/from stand;Minimal assistance       Functional mobility during ADLs: Minimal assistance;Rolling walker                    Pertinent Vitals/Pain Pain Assessment: Faces Faces Pain Scale: Hurts a little bit Pain Location: mild abdominal pain Pain Descriptors / Indicators: Aching     Hand Dominance Right   Extremity/Trunk Assessment Upper Extremity Assessment Upper Extremity Assessment: Overall WFL for tasks assessed   Lower Extremity Assessment Lower Extremity Assessment: Defer to PT evaluation   Cervical / Trunk Assessment Cervical / Trunk Assessment: Normal   Communication Communication Communication: No difficulties   Cognition Arousal/Alertness: Awake/alert Behavior During Therapy: WFL for tasks assessed/performed Overall Cognitive Status: Within Functional Limits for tasks assessed                                     General  Comments  VSS            Home Living Family/patient expects to be discharged to:: Private residence Living Arrangements: Spouse/significant other;Children Available Help at Discharge: Family;Available 24 hours/day Type of Home: House Home Access:  Ramped entrance     Home Layout: One level     Bathroom Shower/Tub: Occupational psychologist: Handicapped height Bathroom Accessibility: Yes How Accessible: Accessible via walker Home Equipment: Grab bars - tub/shower;Walker - 2 wheels;Walker - 4 wheels;Cane - single point;Wheelchair - manual          Prior Functioning/Environment Level of Independence: Independent with assistive device(s)        Comments: Per chart review, as of 03/12/2019, ambulates with RW. Pt reports multi falls.        OT Problem List: Decreased activity tolerance;Impaired balance (sitting and/or standing);Decreased safety awareness;Pain;Obesity      OT Treatment/Interventions: Self-care/ADL training;Therapeutic exercise;DME and/or AE instruction;Therapeutic activities;Patient/family education;Balance training    OT Goals(Current goals can be found in the care plan section) Acute Rehab OT Goals Patient Stated Goal: to be able to go home OT Goal Formulation: With patient Time For Goal Achievement: 06/16/19 Potential to Achieve Goals: Good  OT Frequency: Min 2X/week    AM-PAC OT "6 Clicks" Daily Activity     Outcome Measure Help from another person eating meals?: None Help from another person taking care of personal grooming?: A Little Help from another person toileting, which includes using toliet, bedpan, or urinal?: A Little Help from another person bathing (including washing, rinsing, drying)?: A Lot Help from another person to put on and taking off regular upper body clothing?: A Little Help from another person to put on and taking off regular lower body clothing?: A Lot 6 Click Score: 17   End of Session Equipment Utilized During Treatment: Rolling walker Nurse Communication: Mobility status  Activity Tolerance: Patient tolerated treatment well Patient left: in chair;with call bell/phone within reach;with chair alarm set;with nursing/sitter in room  OT Visit Diagnosis:  Unsteadiness on feet (R26.81);History of falling (Z91.81)                Time: 1135-1200 OT Time Calculation (min): 25 min Charges:  OT General Charges $OT Visit: 1 Visit OT Evaluation $OT Eval Moderate Complexity: 1 Mod OT Treatments $Self Care/Home Management : 8-22 mins   Shon Millet OT OT office: [place] 06/02/2019, 3:48 PM

## 2019-06-02 NOTE — Progress Notes (Signed)
Susan [name] notified of [facility].

## 2019-06-02 NOTE — Progress Notes (Signed)
PROGRESS NOTE    Susan [name]  TGG:[id number] DOB: 10-29-1950 DOA: 05/28/2019 PCP: Alycia Rossetti, MD   Brief Narrative: As per HPI:  69 y.o.femalewith medical historyobtained from medical record secondary patient's encephalopathy in the ER. 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 Patient appears to be altered unable to find much meaningful history. When asked if she had fevers she reported yes and her temperature was 305. Was provided additional information to the ER indicating that her blood sugar has been mild to moderately elevated and she has ALP.  Confused for 1 to 2 days. I was unable to obtain any meaningful information from the patient other than she has some abdominal pain primarily located in the epigastric region she was unable to provide a severity or quantify it simply reporting it was hurting. She did report the ongoing for the last several days which was confirmed by her husband did not report any significant changes in dietary intake, travel, or infectious exposures Patient was admitted, followed by gastroenterology.  Subjective: Seen this morning Was complaining of lower abdominal pain, constipation no BM since last Sunday Has been very weak barely doing anything  Assessment & Plan:   Abnormal LFTs: With cholestatic pattern, IgG is elevated 697 with positive antinuclear antibody, alpha-1 antitrypsin level of 191, iron of 20 ferritin 359 folate 25 B12 349 GGT 227.  Suspecting multifactorial.  Patient remains on hold.  Appreciate GI input will need outpatient follow-up. CT abdomen that showed cholelithiasis, MRCP showed cholelithiasis without evidence of cholecystitis or choledocholithiasis. AST and ALT improving but no significant change in AP.Ultrasound abdomen with Doppler no  acute finding.  NO concern for cholangitis antibiotic discontinued. Recent Labs  Lab 05/28/19 1330 05/29/19 1207 05/30/19 0416 05/31/19 0619 06/01/19 0133 06/02/19 0244  AST  --  208* 149* 84* 104* 83*  ALT  --  240* 200* 142* 121* 97*  ALKPHOS  --  599* 593* 654* 697* 750*  BILITOT  --  5.1* 3.0* 1.3* 2.3* 1.0  PROT  --  5.2* 4.6* 4.3* 4.6* 4.6*  ALBUMIN  --  2.0* 1.8* 1.7* 1.8* 1.7*  INR 1.1  --   --   --   --   --    Acute metabolic encephalopathy suspect multifactorial mental status.  Mental status much better however very weak and deconditioned. CT head was negative, neuro status is stable.  Continue PT OT.  Klebsiella pneumoniae un urine culture: Complete antibiotics to oral Keflex.  Mild anemia likely chronic question dilutional, hemoglobin more or less stable.  History of upper GI bleed secondary to large HP gastric polyp in October 2020 while on Plavix and aspirin, for FOBT -1/2.  No signs of active GI bleeding.  B12 349, iron panel shows ferritin 359, iron 20. Recent Labs  Lab 05/28/19 1808 05/29/19 0221 05/30/19 0416 06/01/19 0133 06/02/19 0244  HGB 12.5 11.5* 11.3* 11.4* 10.4*  HCT 40.1 36.5 36.3 36.5 33.0*   AKI on CKD Stage IV: His creatinine ranging from 1.9-2.4-monitor renal function.  Renal function more or less stable.  Encourage oral intake. Monitor I/o.  Discontinue vancomycin.   Recent Labs  Lab 05/29/19 0221 05/30/19 0416 05/31/19 0619 06/01/19 0133 06/02/19 0244  BUN 54* 57* 50* 50* 48*  CREATININE 2.78* 2.99* 2.55* 2.45* 2.16*   CAD, history of MI x2: On Plavix and aspirin.  Okay to  resume Plavix as per GI. Also on Coreg.  Hyperlipemia: Hold statins for now given abnormal LFTs.  Chronic diastolic heart failure/ trop  301->283-209. No chest pain/ liklely from aki/demand mismatch.gained significant weight due to IV fluid hydration diuretics were on hold.  Weight at 277  lbfrom 254. At home on Torsemide 60 mg twice daily- will resume and also resume  metolazone.  Suspect her increasing weight gain along with deconditioning hospitalization making it difficult with her mobility.   Debility/deconditioning:Suspect her increasing weight gain along with deconditioning hospitalization making it difficult with her mobility.  Resuming her diuretics.  PVD-continue home antiplatelets  Diabetes poorly controlled with hyperglycemia, last A1c 13.1 on 12/28.  Blood sugar 164- 389 poorly controlled.  Continue sliding scale insulin.  Add 5 units Premeal insulin and increase Lantus to 4o units daily. Normally patient on insulin pump at home and she has done well before coming to the hospital . Recent Labs  Lab 06/01/19 2015 06/01/19 2340 06/02/19 0400 06/02/19 0745 06/02/19 1202  GLUCAP 358* 341* 335* 305* 194*   Hypokalemia:repleted  Constipation with abdominal pain: start on a bowel regimen  Morbid obesity with  Body mass index is 46.11 kg/m.  Need follow-up with PCP for weight loss healthy lifestyle.  DVT prophylaxis:Heparin Code Status: full Family Communication: plan of care discussed with patient at bedside. UPDATED DAUGHTER on the phone while in room 1/5 Disposition Plan: Remains inpatient monitoring lfts, weight-resuming her diuretics and monitor renal functions. Patient is very deconditioned and weak and hardly able to take care of herself but refusing to go to inpatient rehab.Unsafe for discharge today.If improves, anticipate discharge home tomorrow with PT OT home health   Consultants: GI Procedures: NONE Microbiology: NONE  Antimicrobials: Anti-infectives (From admission, onward)   Start     Dose/Rate Route Frequency Ordered Stop   06/01/19 1300  cephALEXin (KEFLEX) capsule 250 mg     250 mg Oral Every 12 hours 06/01/19 1223 06/04/19 0959   05/30/19 2000  vancomycin (VANCOREADY) IVPB 1250 mg/250 mL  Status:  Discontinued     1,250 mg 166.7 mL/hr over 90 Minutes Intravenous Every 48 hours 05/28/19 1743 05/30/19 1331   05/29/19  1700  ceFEPIme (MAXIPIME) 2 g in sodium chloride 0.9 % 100 mL IVPB  Status:  Discontinued     2 g 200 mL/hr over 30 Minutes Intravenous Every 24 hours 05/28/19 1747 06/01/19 1223   05/29/19 0100  metroNIDAZOLE (FLAGYL) IVPB 500 mg  Status:  Discontinued     500 mg 100 mL/hr over 60 Minutes Intravenous Every 8 hours 05/28/19 1744 06/01/19 1223   05/28/19 1600  ceFEPIme (MAXIPIME) 2 g in sodium chloride 0.9 % 100 mL IVPB     2 g 200 mL/hr over 30 Minutes Intravenous  Once 05/28/19 1553 05/28/19 1713   05/28/19 1600  metroNIDAZOLE (FLAGYL) IVPB 500 mg     500 mg 100 mL/hr over 60 Minutes Intravenous  Once 05/28/19 1553 05/28/19 1750   05/28/19 1600  vancomycin (VANCOCIN) IVPB 1000 mg/200 mL premix  Status:  Discontinued     1,000 mg 200 mL/hr over 60 Minutes Intravenous  Once 05/28/19 1553 05/28/19 1557   05/28/19 1600  vancomycin (VANCOREADY) IVPB 2000 mg/400 mL     2,000 mg 200 mL/hr over 120 Minutes Intravenous  Once 05/28/19 1557 05/30/19 0651       Objective: Vitals:   06/01/19 1700 06/01/19 2020 06/02/19 0404 06/02/19 0500  BP: (!) 148/62 (!) 144/59 (!) 144/60   Pulse:  75 79 74   Resp:  18 14   Temp:  98.3 F (36.8 C) 98.6 F (37 C)   TempSrc:  Oral Oral   SpO2:  99% 97%   Weight:    125.7 kg  Height:        Intake/Output Summary (Last 24 hours) at 06/02/2019 1444 Last data filed at 06/02/2019 0821 Gross per 24 hour  Intake 360 ml  Output 1850 ml  Net -1490 ml   Filed Weights   05/31/19 0930 06/01/19 0500 06/02/19 0500  Weight: 123.5 kg 124.4 kg 125.7 kg   Weight change: 2.2 kg  Body mass index is 46.11 kg/m.  Intake/Output from previous day: 01/06 0701 - 01/07 0700 In: 960 [P.O.:960] Out: 1750 [Urine:1750] Intake/Output this shift: Total I/O In: -  Out: 600 [Urine:600]  Examination:  General exam: Alert awake oriented, obese, not in acute distress.   HEENT:Oral mucosa moist, Ear/Nose WNL grossly, dentition normal. Respiratory system: Clear breath  sounds bilaterally, no use of accessory muscle. Cardiovascular system: S1 & S2 +, No JVD,. Gastrointestinal system: Abdomen soft, obese/distended, bowel sounds present and nontender.  Nervous System: Alert awake and nonfocal on exam.   Extremities:  B/L nonpitting edema, TMA stump, distal peripheral pulses palpable.  Skin: No rashes,no icterus. MSK: Normal muscle bulk,tone, power  Medications:  Scheduled Meds: . aspirin EC  81 mg Oral Daily  . carvedilol  6.25 mg Oral BID WC  . cephALEXin  250 mg Oral Q12H  . clopidogrel  75 mg Oral Daily  . escitalopram  20 mg Oral Daily  . ferrous sulfate  325 mg Oral BID WC  . heparin  5,000 Units Subcutaneous Q8H  . insulin aspart  0-20 Units Subcutaneous TID WC  . insulin aspart  12 Units Subcutaneous TID WC  . insulin glargine  50 Units Subcutaneous Daily  . levothyroxine  50 mcg Oral QAC breakfast  . metolazone  2.5 mg Oral UD  . pantoprazole  40 mg Oral Daily  . polyethylene glycol  17 g Oral Daily  . pregabalin  100 mg Oral BID  . torsemide  60 mg Oral BID   Continuous Infusions:   Data Reviewed: I have personally reviewed following labs and imaging studies  CBC: Recent Labs  Lab 05/28/19 1808 05/29/19 0221 05/30/19 0416 06/01/19 0133 06/02/19 0244  WBC 6.6 5.5 5.2 6.5 7.4  NEUTROABS  --  4.7  --   --   --   HGB 12.5 11.5* 11.3* 11.4* 10.4*  HCT 40.1 36.5 36.3 36.5 33.0*  MCV 83.0 81.5 83.1 82.0 81.1  PLT 149* 143* 155 143* 706   Basic Metabolic Panel: Recent Labs  Lab 05/29/19 0221 05/30/19 0416 05/31/19 0619 06/01/19 0133 06/02/19 0244  NA 136 135 137 132* 135  K 3.5 3.1* 3.3* 5.1 4.4  CL 104 103 107 104 107  CO2 18* 23 21* 19* 21*  GLUCOSE 410* 170* 197* 358* 364*  BUN 54* 57* 50* 50* 48*  CREATININE 2.78* 2.99* 2.55* 2.45* 2.16*  CALCIUM 8.1* 7.9* 7.8* 7.6* 7.9*   GFR: Estimated Creatinine Clearance: 32.8 mL/min (A) (by C-G formula based on SCr of 2.16 mg/dL (H)). Liver Function Tests: Recent Labs  Lab  05/29/19 1207 05/30/19 0416 05/31/19 0619 06/01/19 0133 06/02/19 0244  AST 208* 149* 84* 104* 83*  ALT 240* 200* 142* 121* 97*  ALKPHOS 599* 593* 654* 697* 750*  BILITOT 5.1* 3.0* 1.3* 2.3* 1.0  PROT 5.2* 4.6* 4.3* 4.6* 4.6*  ALBUMIN  2.0* 1.8* 1.7* 1.8* 1.7*   Recent Labs  Lab 05/28/19 1151  LIPASE 30   Recent Labs  Lab 05/29/19 0221  AMMONIA 51*   Coagulation Profile: Recent Labs  Lab 05/28/19 1330  INR 1.1   Cardiac Enzymes: No results for input(s): CKTOTAL, CKMB, CKMBINDEX, TROPONINI in the last 168 hours. BNP (last 3 results) No results for input(s): PROBNP in the last 8760 hours. HbA1C: No results for input(s): HGBA1C in the last 72 hours. CBG: Recent Labs  Lab 06/01/19 2015 06/01/19 2340 06/02/19 0400 06/02/19 0745 06/02/19 1202  GLUCAP 358* 341* 335* 305* 194*   Lipid Profile: No results for input(s): CHOL, HDL, LDLCALC, TRIG, CHOLHDL, LDLDIRECT in the last 72 hours. Thyroid Function Tests: No results for input(s): TSH, T4TOTAL, FREET4, T3FREE, THYROIDAB in the last 72 hours. Anemia Panel: No results for input(s): VITAMINB12, FOLATE, FERRITIN, TIBC, IRON, RETICCTPCT in the last 72 hours. Sepsis Labs: Recent Labs  Lab 05/28/19 1330  LATICACIDVEN 1.9    Recent Results (from the past 240 hour(s))  Blood Culture (routine x 2)     Status: None   Collection Time: 05/28/19  1:30 PM   Specimen: BLOOD RIGHT FOREARM  Result Value Ref Range Status   Specimen Description BLOOD RIGHT FOREARM  Final   Special Requests   Final    BOTTLES DRAWN AEROBIC AND ANAEROBIC Blood Culture adequate volume   Culture   Final    NO GROWTH 5 DAYS Performed at [facility], [address], [place], [place] [address]    Report Status 06/02/2019 FINAL  Final  Blood Culture (routine x 2)     Status: None   Collection Time: 05/28/19  6:22 PM   Specimen: BLOOD LEFT HAND  Result Value Ref Range Status   Specimen Description BLOOD LEFT HAND  Final   Special Requests    Final    BOTTLES DRAWN AEROBIC ONLY Blood Culture results may not be optimal due to an inadequate volume of blood received in culture bottles   Culture   Final    NO GROWTH 5 DAYS Performed at [facility], [place] [address], [place],  [address]    Report Status 06/02/2019 FINAL  Final  Urine culture     Status: Abnormal   Collection Time: 05/28/19  6:31 PM   Specimen: In/Out Cath Urine  Result Value Ref Range Status   Specimen Description IN/OUT CATH URINE  Final   Special Requests   Final    NONE Performed at [facility], [place] [address] [place], [place] [address]    Culture 30,000 COLONIES/mL KLEBSIELLA PNEUMONIAE (A)  Final   Report Status 05/30/2019 FINAL  Final   Organism ID, Bacteria KLEBSIELLA PNEUMONIAE (A)  Final      Susceptibility   Klebsiella pneumoniae - MIC*    AMPICILLIN RESISTANT Resistant     CEFAZOLIN <=4 SENSITIVE Sensitive     CEFTRIAXONE <=0.25 SENSITIVE Sensitive     CIPROFLOXACIN <=0.25 SENSITIVE Sensitive     GENTAMICIN <=1 SENSITIVE Sensitive     IMIPENEM <=0.25 SENSITIVE Sensitive     NITROFURANTOIN 128 RESISTANT Resistant     TRIMETH/SULFA <=20 SENSITIVE Sensitive     AMPICILLIN/SULBACTAM 4 SENSITIVE Sensitive     PIP/TAZO <=4 SENSITIVE Sensitive     * 30,000 COLONIES/mL KLEBSIELLA PNEUMONIAE  SARS CORONAVIRUS 2 (TAT 6-24 HRS) Nasopharyngeal Nasopharyngeal Swab     Status: None   Collection Time: 05/28/19  7:37 PM   Specimen: Nasopharyngeal  Swab  Result Value Ref Range Status   SARS Coronavirus 2 NEGATIVE NEGATIVE Final    Comment: (NOTE) SARS-CoV-2 target nucleic acids are NOT DETECTED. The SARS-CoV-2 RNA is generally detectable in upper and lower respiratory specimens during the acute phase of infection. Negative results do not preclude SARS-CoV-2 infection, do not rule out co-infections with other pathogens, and should not be used as the sole basis for treatment or other patient management decisions. Negative results must  be combined with clinical observations, patient history, and epidemiological information. The expected result is Negative. Fact Sheet for Patients: SugarRoll.be Fact Sheet for Healthcare Providers: https://www.woods-mathews.com/ This test is not yet approved or cleared by the [place] FDA and  has been authorized for detection and/or diagnosis of SARS-CoV-2 by FDA under an Emergency Use Authorization (EUA). This EUA will remain  in effect (meaning this test can be used) for the duration of the COVID-19 declaration under Section 56 4(b)(1) of the Act, 21 U.S.C. section 360bbb-3(b)(1), unless the authorization is terminated or revoked sooner. Performed at [facility], [place] [address], [place],  [address]       Radiology Studies: No results found.    LOS: 5 days   Time spent: More than 50% of that time was spent in counseling and/or coordination of care.  Antonieta Pert, MD [facility]  06/02/2019, 2:44 PM

## 2019-06-02 NOTE — Progress Notes (Signed)
Inpatient Rehabilitation Admissions Coordinator  I met with patient at bedside for rehab assessment. I discussed the possible need for an inpt rehab admission. Pt's spouse works days, but her 70 year old son there all the time. She does not want to consider an inpt rehab admit. She prefers d/c home. I will alert RN CM, Heather and SW, [place].  Danne Baxter, RN, MSN Rehab Admissions Coordinator [phone] 06/02/2019 11:24 AM

## 2019-06-02 NOTE — TOC Initial Note (Signed)
Transition of Care [facility]) - Initial/Assessment Note    Patient Details  Name: Susan [name] MRN: [id number] Date of Birth: 1950/11/16  Transition of Care [facility]) CM/SW Contact:    Marilu Favre, RN Phone Number: 06/02/2019, 1:02 PM  Clinical Narrative:                 Patient from home with husband and 70 year old son. Patient has 24 hour supervision at home. Patient active with [facility] for HHPT prior to admission. Confirmed with Mateo Flow with [facility]. Will need orders and face to face for resumption of care. Messaged MD.  Patient has walker, cane, bedside commode and purewic at home already.  Expected Discharge Plan: [place] Barriers to Discharge: Continued Medical Work up   Patient Goals and CMS Choice Patient states their goals for this hospitalization and ongoing recovery are:: to go home CMS Medicare.gov Compare Post Acute Care list provided to:: Patient Choice offered to / list presented to : Patient  Expected Discharge Plan and Services Expected Discharge Plan: [place] Choice: [place] arrangements for the past 2 months: Single Family Home                 DME Arranged: N/A         HH Arranged: PT HH Agency: [facility] ([place]) Date [place]: 06/02/19 Time [place]: 1301 Representative spoke with at [place]: [place] Arrangements/Services Living arrangements for the past 2 months: [place] with:: Adult Children, Spouse Patient language and need for interpreter reviewed:: Yes Do you feel safe going back to the place where you live?: Yes      Need for Family Participation in Patient Care: Yes (Comment) Care giver support system in place?: Yes (comment)   Criminal Activity/Legal Involvement Pertinent to Current Situation/Hospitalization: No - Comment as needed  Activities of Daily Living      Permission  Sought/Granted   Permission granted to share information with : Yes, Verbal Permission Granted     Permission granted to share info w AGENCY: [facility]        Emotional Assessment Appearance:: Appears stated age Attitude/Demeanor/Rapport: Engaged Affect (typically observed): Accepting Orientation: : Oriented to Self, Oriented to Place, Oriented to  Time, Oriented to Situation Alcohol / Substance Use: Not Applicable Psych Involvement: No (comment)  Admission diagnosis:  Transaminitis [R74.01] Common bile duct (CBD) obstruction [K83.1] Sepsis, due to unspecified organism, unspecified whether acute organ dysfunction present [facility]) [A41.9] Patient Active Problem List   Diagnosis Date Noted  . Common bile duct (CBD) obstruction 05/28/2019  . Benign neoplasm of ascending colon   . Benign neoplasm of transverse colon   . Benign neoplasm of descending colon   . Benign neoplasm of sigmoid colon   . Gastric polyps   . Hyperkalemia 03/11/2019  . Prolonged QT interval 03/11/2019  . Onychomycosis 06/21/2018  . Pressure injury of skin 03/19/2018  . Syncope 03/18/2018  . Osteomyelitis of second toe of right foot ([place])   . Toe osteomyelitis, right ([place])   . 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 ([place])   . PVD (peripheral vascular disease) ([place]) 10/26/2017  . Hypothyroid 07/27/2017  . PAH (pulmonary artery hypertension) ([place])   . Impaired ambulation 07/19/2017  . Leg cramps 02/27/2017  . Peripheral edema 01/12/2017  . Diabetic neuropathy ([place]) 11/12/2016  . CKD  stage 4 due to type 2 diabetes mellitus ([place]) 10/24/2015  . Anemia 10/03/2015  . Generalized anxiety disorder 10/03/2015  . Insomnia 10/03/2015  . Chest pain   . Gastritis 06/07/2015  . Diabetes mellitus type II, uncontrolled ([place]) 06/07/2015  . Chronic diastolic CHF (congestive heart failure) ([place]) 06/07/2015  . Non compliance with medical treatment 04/17/2014  . Rotator cuff tear 03/14/2014  .  Morbid obesity ([place]) 09/23/2013  . Acute kidney injury superimposed on chronic kidney disease ([place]) 12/25/2012  . Hypotension 12/25/2012  . Urinary incontinence   . MDD (major depressive disorder) 11/12/2010  . RBBB (right bundle branch block)   . CAD (coronary artery disease)   . Hyperlipemia 01/22/2009  . Essential hypertension 01/22/2009   PCP:  Alycia Rossetti, MD Pharmacy:   [id number] - [place], [address] [place] [address] Phone: [phone] Fax: [phone]     Social Determinants of Health (SDOH) Interventions    Readmission Risk Interventions No flowsheet data found.

## 2019-06-02 NOTE — Plan of Care (Signed)
  Problem: Clinical Measurements: Goal: Will remain free from infection Outcome: Progressing Goal: Diagnostic test results will improve Outcome: Progressing Goal: Respiratory complications will improve Outcome: Progressing Goal: Cardiovascular complication will be avoided Outcome: Progressing   Problem: Coping: Goal: Level of anxiety will decrease Outcome: Progressing   Problem: Elimination: Goal: Will not experience complications related to bowel motility Outcome: Progressing Goal: Will not experience complications related to urinary retention Outcome: Progressing

## 2019-06-02 NOTE — Progress Notes (Signed)
Tylene Fantasia notified of 335 CBG.

## 2019-06-03 LAB — COMPREHENSIVE METABOLIC PANEL
ALT: 85 U/L — ABNORMAL HIGH (ref 0–44)
AST: 80 U/L — ABNORMAL HIGH (ref 15–41)
Albumin: 1.7 g/dL — ABNORMAL LOW (ref 3.5–5.0)
Alkaline Phosphatase: 862 U/L — ABNORMAL HIGH (ref 38–126)
Anion gap: 8 (ref 5–15)
BUN: 43 mg/dL — ABNORMAL HIGH (ref 8–23)
CO2: 22 mmol/L (ref 22–32)
Calcium: 7.9 mg/dL — ABNORMAL LOW (ref 8.9–10.3)
Chloride: 103 mmol/L (ref 98–111)
Creatinine, Ser: 1.89 mg/dL — ABNORMAL HIGH (ref 0.44–1.00)
GFR calc Af Amer: 31 mL/min — ABNORMAL LOW (ref >60)
GFR calc non Af Amer: 27 mL/min — ABNORMAL LOW (ref >60)
Glucose, Bld: 273 mg/dL — ABNORMAL HIGH (ref 70–99)
Potassium: 3.8 mmol/L (ref 3.5–5.1)
Sodium: 133 mmol/L — ABNORMAL LOW (ref 135–145)
Total Bilirubin: 1.5 mg/dL — ABNORMAL HIGH (ref 0.3–1.2)
Total Protein: 4.6 g/dL — ABNORMAL LOW (ref 6.5–8.1)

## 2019-06-03 LAB — GLUCOSE, CAPILLARY
Glucose-Capillary: 236 mg/dL — ABNORMAL HIGH (ref 70–99)
Glucose-Capillary: 169 mg/dL — ABNORMAL HIGH (ref 70–99)

## 2019-06-03 NOTE — Progress Notes (Signed)
Physical Therapy Treatment Patient Details Name: Susan [name] MRN: [id number] DOB: [birth year] Today's Date: 06/03/2019    History of Present Illness Admitted with encephalopathy, UTI, AKI on CKD, poorly controlled diabetes; PVD with RLE forefoot amputations; Per Gastroenterology, She possible passed a common bile duct stone verses underlying liver disease.    PT Comments    Pt pleasant and very willing to mobilize with linens soaked on arrival. Pt able to walk in room with RW and discussed need for spouse to follow with rollator for seated rest for entry into home and for current progression for safety. Pt educated for HEP and progression with encouragement to mobilize daily with nursing staff.  97% on RA and HR 67    Follow Up Recommendations  Home health PT;Supervision/Assistance - 24 hour     Equipment Recommendations  None recommended by PT    Recommendations for Other Services       Precautions / Restrictions Precautions Precautions: Fall Precaution Comments: incontinent    Mobility  Bed Mobility Overal bed mobility: Needs Assistance Bed Mobility: Supine to Sit     Supine to sit: Min assist;HOB elevated     General bed mobility comments: HOB 20 degrees with min assist to fully elevate trunk with cues to slide to EOB. Pt with bed soaked on arrival as suction cap to purewick not closed  Transfers Overall transfer level: Needs assistance   Transfers: Sit to/from Stand;Stand Pivot Transfers Sit to Stand: Min guard Stand pivot transfers: Min guard       General transfer comment: pt able to pivot from bed>BSC with use of RW and performed sit<>stand x 4 trials. Assist for pericare in standing with repeated trials and assist for clothing management  Ambulation/Gait Ambulation/Gait assistance: Min guard Gait Distance (Feet): 30 Feet Assistive device: Rolling walker (2 wheeled) Gait Pattern/deviations: Step-through pattern;Decreased stride length;Wide base of  support   Gait velocity interpretation: 1.31 - 2.62 ft/sec, indicative of limited community ambulator General Gait Details: pt with reliance on RW with pt walking 4' then 38' with chair follow but not needed next trial.   Stairs             Wheelchair Mobility    Modified Rankin (Stroke Patients Only)       Balance Overall balance assessment: Needs assistance Sitting-balance support: No upper extremity supported;Feet supported Sitting balance-Leahy Scale: Good     Standing balance support: Bilateral upper extremity supported;During functional activity Standing balance-Leahy Scale: Poor Standing balance comment: reliant on external support for gait                            Cognition Arousal/Alertness: Awake/alert Behavior During Therapy: WFL for tasks assessed/performed Overall Cognitive Status: Within Functional Limits for tasks assessed                                        Exercises General Exercises - Lower Extremity Long Arc Quad: AROM;Both;10 reps;Seated Hip Flexion/Marching: AROM;Both;10 reps;Seated    General Comments        Pertinent Vitals/Pain Faces Pain Scale: Hurts little more Pain Location: mild abdominal pain Pain Descriptors / Indicators: Aching Pain Intervention(s): Limited activity within patient's tolerance;Repositioned;Monitored during session    Home Living                      Prior Function  PT Goals (current goals can now be found in the care plan section) Progress towards PT goals: Progressing toward goals    Frequency    Min 3X/week      PT Plan Current plan remains appropriate    Co-evaluation              AM-PAC PT "6 Clicks" Mobility   Outcome Measure  Help needed turning from your back to your side while in a flat bed without using bedrails?: A Little Help needed moving from lying on your back to sitting on the side of a flat bed without using bedrails?: A  Little Help needed moving to and from a bed to a chair (including a wheelchair)?: A Little Help needed standing up from a chair using your arms (e.g., wheelchair or bedside chair)?: A Little Help needed to walk in hospital room?: A Little Help needed climbing 3-5 steps with a railing? : A Lot 6 Click Score: 17    End of Session Equipment Utilized During Treatment: Gait belt Activity Tolerance: Patient tolerated treatment well Patient left: in chair;with call bell/phone within reach;with chair alarm set Nurse Communication: Mobility status PT Visit Diagnosis: Unsteadiness on feet (R26.81);Other abnormalities of gait and mobility (R26.89);History of falling (Z91.81)     Time: 2725-3664 PT Time Calculation (min) (ACUTE ONLY): 32 min  Charges:  $Gait Training: 8-22 mins $Therapeutic Exercise: 8-22 mins                     Susan [name], PT Acute Rehabilitation Services Pager: [phone] Office: [phone]    Susan [name] 06/03/2019, 1:20 PM

## 2019-06-03 NOTE — Progress Notes (Signed)
Patient discharged to home with instructions. 

## 2019-06-03 NOTE — Discharge Summary (Signed)
Physician Discharge Summary  Susan [name] PYK:[id number] DOB: 03/02/1951 DOA: 05/28/2019  PCP: Alycia Rossetti, MD  Admit date: 05/28/2019 Discharge date: 06/03/2019  Admitted From: HOME Disposition:  HOME W HH  Recommendations for Outpatient Follow-up:  1. Follow up with PCP in 1-2 weeks 2. Please obtain BMP/CBC in one week 3. Please follow up on the following pending results:  Home Health:Yes  Equipment/Devices:None  Discharge Condition: Stable Code Status: full* Diet recommendation: Heart Healthy, diabetic  Brief/Interim Summary:  69 y.o.femalewith medical historyobtained from medical record secondary patient's encephalopathy in the ER. 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 Patient appears to be altered unable to find much meaningful history. When asked if she had fevers she reported yes and her temperature was 305. Was provided additional information to the ER indicating that her blood sugar has been mild to moderately elevated and she has ALP.  Confused for 1 to 2 days. I was unable to obtain any meaningful information from the patient other than she has some abdominal pain primarily located in the epigastric region she was unable to provide a severity or quantify it simply reporting it was hurting. She did report the ongoing for the last several days which was confirmed by her husband did not report any significant changes in dietary intake, travel, or infectious exposures Patient was admitted, followed by gastroenterology. Her elevated LFTs exhibited cholestatic pattern-she underwent extensive evaluation by GI with- IgG is elevated 697 with positive antinuclear antibody, alpha-1 antitrypsin level of 191, iron of 20 ferritin 359 folate 25 B12 349 GGT 227.  Suspecting multifactorial.   Patient remains on hold.  Appreciate GI input will need outpatient follow-up. CT abdomen that showed cholelithiasis, MRCP showed cholelithiasis without evidence of cholecystitis or choledocholithiasis. AST and ALT improving but no significant change in AP.Ultrasound abdomen with Doppler no acute finding.  NO concern for cholangitis antibiotic discontinued.  GI advised outpatient follow-up.  At this time patient is tolerating diet, having bowel movement.  PT OT advised CIR but patient absolutely refused and wanted to go home with home health PT. OT saw the patient yesterday and had advised hht/supervision.  She reports she did ambulate with physical therapy today needing walker.  Patient adamant on not going to skilled nursing facility or rehab and wants to go home.  At this time she is medically stable she will need to follow-up with GI to repeat LFTs.  Discharge Diagnoses:  ([place])  Abnormal LFTs:w/ cholestatic pattern, IgG is elevated 697 with positive antinuclear antibody, alpha-1 antitrypsin level of 191, iron of 20 ferritin 359 folate 25 B12 349 GGT 227.  Suspecting multifactorial.  Patient remains on hold.  Appreciate GI input will need outpatient follow-up. CT abdomen that showed cholelithiasis, MRCP showed cholelithiasis without evidence of cholecystitis or choledocholithiasis. AST and ALT improving but no significant change in AP.Ultrasound abdomen with Doppler no acute finding.  NO concern for cholangitis antibiotic discontinued. Recent Labs  Lab 05/28/19 1330 05/30/19 0416 05/31/19 0619 06/01/19 0133 06/02/19 0244 06/03/19 0309  AST  --  149* 84* 104* 83* 80*  ALT  --  200* 142* 121* 97* 85*  ALKPHOS  --  593* 654* 697* 750* 862*  BILITOT  --  3.0* 1.3* 2.3* 1.0 1.5*  PROT  --  4.6* 4.3* 4.6* 4.6* 4.6*  ALBUMIN  --  1.8* 1.7* 1.8* 1.7* 1.7*  INR  1.1  --   --   --   --   --    Acute metabolic encephalopathy suspect multifactorial : It has resolved patient is alert awake oriented and at  baseline.CT head was negative, neuro status is stable.  Continue PT OT.  Klebsiella pneumoniae un urine culture: Completiong antibiotics oral Keflex.  Mild anemia likely chronic question dilutional, hemoglobin more or less stable. History of upper GI bleed secondary to large HP gastric polyp in October 2020 while on Plavix and aspirin, for FOBT -1/2.  No signs of active GI bleeding.B12 349, iron panel shows ferritin 359, iron 20. Recent Labs  Lab 05/28/19 1808 05/29/19 0221 05/30/19 0416 06/01/19 0133 06/02/19 0244  HGB 12.5 11.5* 11.3* 11.4* 10.4*  HCT 40.1 36.5 36.3 36.5 33.0*   AKI on CKD Stage IV: His creatinine ranging from 1.9-2.4-monitor renal function.  Renal function more or less stable.  Encourage oral intake. Monitor I/o.  Discontinue vancomycin.  Renal function has nicely improved Recent Labs  Lab 05/30/19 0416 05/31/19 0619 06/01/19 0133 06/02/19 0244 06/03/19 0309  BUN 57* 50* 50* 48* 43*  CREATININE 2.99* 2.55* 2.45* 2.16* 1.89*   CAD, history of MI x2: On Plavix and aspirin, Coreg.  Hyperlipemia: Hold statins for now given abnormal LFTs.Resume once okay with GI  Chronic diastolic heart failure/ trop  301->283-209. No chest pain/ liklely from aki/demand mismatch.gained significant weight due to IV fluid hydration diuretics were on hold.  Weight at 277  lbfrom 254. At home on Torsemide 60 mg twice daily- and will resume now it is improving to 71 pound and renal function is stable.  We will advised to continue on her home diuretics since patient at this time has no shortness of breath she will follow-up with PCP cardiology and monitor her daily weight at home.   Debility/deconditioning:She refused CIR and will go home with home health PT   PVD-continue home antiplatelets  Diabetes poorly controlled with hyperglycemia, last A1c 13.1 on 12/28.  Blood sugar nowt improving after adjusting her Lantus and sliding scale insulin and premeal insulin.  At home she will  resume her home insulin pump and follow-up with her endocrinologist  Recent Labs  Lab 06/02/19 1202 06/02/19 1629 06/02/19 2158 06/03/19 0756 06/03/19 1229  GLUCAP 194* 242* 296* 236* 169*   Hypokalemia:repleted  Constipation with abdominal pain:start on a bowel regimen.  Morbid obesity with  Body mass index is 46.11 kg/m:Need follow-up with PCP for weight loss healthy lifestyle.  DVT prophylaxis:Heparin Code Status:Full Family Communication: plan of care discussed with patient at bedside. UPDATED DAUGHTER on the phone while in room 1/5.  Discussed with patient's husband regarding disposition plan 1/8 Disposition Plan:Refused CIR or palceement.Will d/c home with PT OT home health She is high risk for readmission.Spoke with her husband and agrees with home- reports they have son 24/7 and will have PT 2x/wk. discussed with the husband regarding monitoring of the weight, follow-up labs and d/c instructions.  Consults: gi  Subjective: No nausea vomiting fever or chills.  Reports having significant urinary output after being on torsemide. Some abdomen discomfort.  Discharge Exam: Vitals:   06/02/19 2155 06/03/19 0455  BP: (!) 137/52 (!) 157/59  Pulse: 74 70  Resp: 18 18  Temp: 98.4 F (36.9 C) 98.1 F (36.7 C)  SpO2: 96% 98%   General: Pt is alert, awake, not in acute distress. Cardiovascular: RRR, S1/S2 +, no rubs, no gallops. Respiratory: CTA bilaterally, no wheezing, no rhonchi. Abdominal: Soft, NT, ND,  bowel sounds +. Extremities: mild edema, no cyanosis.  Discharge Instructions  Discharge Instructions    (HEART FAILURE PATIENTS) Call MD:  Anytime you have any of the following symptoms: 1) 3 pound weight gain in 24 hours or 5 pounds in 1 week 2) shortness of breath, with or without a dry hacking cough 3) swelling in the hands, feet or stomach 4) if you have to sleep on extra pillows at night in order to breathe.   Complete by: As directed    Diet - low sodium  heart healthy   Complete by: As directed    Discharge instructions   Complete by: As directed    Please call call MD or return to ER for similar or worsening recurring problem that brought you to hospital or if any fever,nausea/vomiting,abdominal pain, uncontrolled pain, chest pain,  shortness of breath or any other alarming symptoms.  Please follow-up your doctor as instructed in a week time and call the office for appointment.  Your cholesterol medication statin has been on hold until liver functions improved please follow-up with your gastroenterology or PCP to resume it.  Please avoid alcohol, smoking, or any other illicit substance and maintain healthy habits including taking your regular medications as prescribed.  You were cared for by a hospitalist during your hospital stay. If you have any questions about your discharge medications or the care you received while you were in the hospital after you are discharged, you can call the unit and ask to speak with the hospitalist on call if the hospitalist that took care of you is not available.  Once you are discharged, your primary care physician will handle any further medical issues. Please note that NO REFILLS for any discharge medications will be authorized once you are discharged, as it is imperative that you return to your primary care physician (or establish a relationship with a primary care physician if you do not have one) for your aftercare needs so that they can reassess your need for medications and monitor your lab values   Increase activity slowly   Complete by: As directed      Allergies as of 06/03/2019      Reactions   Codeine Nausea And Vomiting      Medication List    STOP taking these medications   rosuvastatin 20 MG tablet Commonly known as: CRESTOR     TAKE these medications   acetaminophen 500 MG tablet Commonly known as: TYLENOL Take 1,000-1,500 mg by mouth every 6 (six) hours as needed for headache  (pain).   allopurinol 100 MG tablet Commonly known as: Zyloprim Take 1 tablet (100 mg total) by mouth 2 (two) times daily.   aspirin EC 81 MG tablet Take 81 mg by mouth every morning.   carvedilol 12.5 MG tablet Commonly known as: COREG TAKE 1 TABLET BY MOUTH TWICE A DAY WITH MEALS   cholecalciferol 1000 units tablet Commonly known as: VITAMIN D Take 1,000 Units by mouth daily with supper.   clopidogrel 75 MG tablet Commonly known as: PLAVIX TAKE 1 TABLET BY MOUTH EVERY DAY WITH BREAKFAST What changed: See the new instructions.   colchicine 0.6 MG tablet Take 1 tablet (0.6 mg total) by mouth daily. As needed for gout flare   escitalopram 20 MG tablet Commonly known as: LEXAPRO TAKE 1 TABLET BY MOUTH EVERY DAY   ferrous sulfate 325 (65 FE) MG tablet Take 1 tablet (325 mg total) by mouth 2 (two) times daily with a meal. Reported  on 08/21/2015   insulin NPH Human 100 UNIT/ML injection Commonly known as: NOVOLIN N Inject 15 Units into the skin See admin instructions. Inject 15 units subcutaneously in the morning if CBG >300, inject 10 units if AM CBG 200-300   levothyroxine 50 MCG tablet Commonly known as: SYNTHROID Take 1 tablet (50 mcg total) by mouth daily before breakfast.   magnesium oxide 400 MG tablet Commonly known as: MAG-OX TAKE 1 TABLET BY MOUTH EVERY DAY   metolazone 2.5 MG tablet Commonly known as: ZAROXOLYN Take 1 tablet (2.5 mg total) by mouth as directed. Monday and Friday What changed:   when to take this  additional instructions   nitroGLYCERIN 0.4 MG SL tablet Commonly known as: NITROSTAT Place 1 tablet (0.4 mg total) under the tongue every 5 (five) minutes as needed for chest pain.   OmniPod Dash 5 Pack Pods Misc Inject into the skin See admin instructions. Use continuously with Novolin R - change every 72 hours. Manually add bolus to continuous dose via OmniPod 3 times daily per sliding scale: CBG 80-150 7 units, 151-200 9 units, 201-250 12  units, 251-300 14 units, 301-400 17 units.   OneTouch Verio test strip Generic drug: glucose blood   pantoprazole 40 MG tablet Commonly known as: Protonix Take 1 tablet (40 mg total) by mouth daily.   pregabalin 150 MG capsule Commonly known as: Lyrica Take 1 capsule (150 mg total) by mouth 2 (two) times daily.   ProAir HFA 108 (90 Base) MCG/ACT inhaler Generic drug: albuterol TAKE 2 PUFFS BY MOUTH EVERY 6 HOURS AS NEEDED FOR WHEEZE OR SHORTNESS OF BREATH What changed: See the new instructions.   torsemide 20 MG tablet Commonly known as: DEMADEX Take 3 tablets (60 mg total) by mouth 2 (two) times daily.   traZODone 100 MG tablet Commonly known as: DESYREL TAKE 1 TABLET (100 MG TOTAL) BY MOUTH AT BEDTIME AS NEEDED FOR SLEEP.   VITAMIN C PO Take 1 tablet by mouth daily with supper.   vitamin E 1000 UNIT capsule Generic drug: vitamin E Take 1,000 Units by mouth daily with supper.      Follow-up Information    [name], MD. Call in 1 week(s).   Specialty: Gastroenterology Contact information: [address], [place] [address] [phone]        Alycia Rossetti, MD Follow up in 1 week(s).   Specialty: Family Medicine Contact information: [address] [place] [address] [phone]        Bensimhon, Shaune Pascal, MD .   Specialty: Cardiology Contact information: [place] [address] [phone]          Allergies  Allergen Reactions  . Codeine Nausea And Vomiting    The results of significant diagnostics from this hospitalization (including imaging, microbiology, ancillary and laboratory) are listed below for reference.    Microbiology: Recent Results (from the past 240 hour(s))  Blood Culture (routine x 2)     Status: None   Collection Time: 05/28/19  1:30 PM   Specimen: BLOOD RIGHT FOREARM  Result Value Ref Range Status   Specimen Description BLOOD RIGHT FOREARM  Final    Special Requests   Final    BOTTLES DRAWN AEROBIC AND ANAEROBIC Blood Culture adequate volume   Culture   Final    NO GROWTH 5 DAYS Performed at [facility], [address], [place], [place] [address]    Report Status  06/02/2019 FINAL  Final  Blood Culture (routine x 2)     Status: None   Collection Time: 05/28/19  6:22 PM   Specimen: BLOOD LEFT HAND  Result Value Ref Range Status   Specimen Description BLOOD LEFT HAND  Final   Special Requests   Final    BOTTLES DRAWN AEROBIC ONLY Blood Culture results may not be optimal due to an inadequate volume of blood received in culture bottles   Culture   Final    NO GROWTH 5 DAYS Performed at [facility], [place] [address] [place], [place] [address]    Report Status 06/02/2019 FINAL  Final  Urine culture     Status: Abnormal   Collection Time: 05/28/19  6:31 PM   Specimen: In/Out Cath Urine  Result Value Ref Range Status   Specimen Description IN/OUT CATH URINE  Final   Special Requests   Final    NONE Performed at [facility], [place] [address] [place], [place] [address]    Culture 30,000 COLONIES/mL KLEBSIELLA PNEUMONIAE (A)  Final   Report Status 05/30/2019 FINAL  Final   Organism ID, Bacteria KLEBSIELLA PNEUMONIAE (A)  Final      Susceptibility   Klebsiella pneumoniae - MIC*    AMPICILLIN RESISTANT Resistant     CEFAZOLIN <=4 SENSITIVE Sensitive     CEFTRIAXONE <=0.25 SENSITIVE Sensitive     CIPROFLOXACIN <=0.25 SENSITIVE Sensitive     GENTAMICIN <=1 SENSITIVE Sensitive     IMIPENEM <=0.25 SENSITIVE Sensitive     NITROFURANTOIN 128 RESISTANT Resistant     TRIMETH/SULFA <=20 SENSITIVE Sensitive     AMPICILLIN/SULBACTAM 4 SENSITIVE Sensitive     PIP/TAZO <=4 SENSITIVE Sensitive     * 30,000 COLONIES/mL KLEBSIELLA PNEUMONIAE  SARS CORONAVIRUS 2 (TAT 6-24 HRS) Nasopharyngeal Nasopharyngeal Swab     Status: None   Collection Time: 05/28/19  7:37 PM   Specimen: Nasopharyngeal Swab  Result Value Ref Range Status    SARS Coronavirus 2 NEGATIVE NEGATIVE Final    Comment: (NOTE) SARS-CoV-2 target nucleic acids are NOT DETECTED. The SARS-CoV-2 RNA is generally detectable in upper and lower respiratory specimens during the acute phase of infection. Negative results do not preclude SARS-CoV-2 infection, do not rule out co-infections with other pathogens, and should not be used as the sole basis for treatment or other patient management decisions. Negative results must be combined with clinical observations, patient history, and epidemiological information. The expected result is Negative. Fact Sheet for Patients: SugarRoll.be Fact Sheet for Healthcare Providers: https://www.woods-mathews.com/ This test is not yet approved or cleared by the [place] FDA and  has been authorized for detection and/or diagnosis of SARS-CoV-2 by FDA under an Emergency Use Authorization (EUA). This EUA will remain  in effect (meaning this test can be used) for the duration of the COVID-19 declaration under Section 56 4(b)(1) of the Act, 21 U.S.C. section 360bbb-3(b)(1), unless the authorization is terminated or revoked sooner. Performed at [facility], [place] [address], [place], [place] [address]     Procedures/Studies: CT ABDOMEN PELVIS WO CONTRAST  Result Date: 05/28/2019 CLINICAL DATA:  Elevated liver function test, generalized abdominal pain. EXAM: CT ABDOMEN AND PELVIS WITHOUT CONTRAST TECHNIQUE: Multidetector CT imaging of the abdomen and pelvis was performed following the standard protocol without IV contrast. COMPARISON:  March 11, 2019. FINDINGS: Lower chest: Mild right middle lobe subsegmental atelectasis or infiltrate is noted. Hepatobiliary: Mild cholelithiasis is noted. No biliary dilatation is noted. No focal abnormality  is seen in the liver on these unenhanced images. Pancreas: Unremarkable. No pancreatic ductal dilatation or surrounding inflammatory changes.  Spleen: Calcified splenic granulomata are noted. Adrenals/Urinary Tract: Adrenal glands are unremarkable. Kidneys are normal, without renal calculi, focal lesion, or hydronephrosis. Bladder is unremarkable. Stomach/Bowel: Stomach is within normal limits. Appendix appears normal. No evidence of bowel wall thickening, distention, or inflammatory changes. Vascular/Lymphatic: Aortic atherosclerosis. No enlarged abdominal or pelvic lymph nodes. Reproductive: Prostate is unremarkable. Other: No abdominal wall hernia or abnormality. No abdominopelvic ascites. Musculoskeletal: No acute or significant osseous findings. IMPRESSION: Cholelithiasis. Mild right middle lobe subsegmental atelectasis or infiltrate is noted. Aortic Atherosclerosis (ICD10-I70.0). Electronically Signed   By: Marijo Conception M.D.   On: 05/28/2019 14:50   DG Chest 2 View  Result Date: 05/28/2019 CLINICAL DATA:  Shortness of breath. Elevated blood sugar. Hypertension. CHF. EXAM: CHEST - 2 VIEW COMPARISON:  03/11/2019 FINDINGS: Minimally motion degraded lateral view. Patient rotated left on the frontal radiograph. Borderline cardiomegaly. Atherosclerosis in the transverse aorta. No pleural effusion or pneumothorax. No congestive failure. Clear lungs. IMPRESSION: No acute cardiopulmonary disease. Aortic Atherosclerosis (ICD10-I70.0). Electronically Signed   By: Abigail Miyamoto M.D.   On: 05/28/2019 12:15   CT Head Wo Contrast  Result Date: 05/28/2019 CLINICAL DATA:  Hyperglycemia, encephalopathy EXAM: CT HEAD WITHOUT CONTRAST TECHNIQUE: Contiguous axial images were obtained from the base of the skull through the vertex without intravenous contrast. COMPARISON:  10/25/2015 FINDINGS: Brain: Brain atrophy and chronic white matter microvascular ischemic changes throughout both cerebral hemispheres. No acute intracranial hemorrhage, new mass lesion, acute infarction, midline shift, herniation, hydrocephalus, or extra-axial fluid collection. No focal mass  effect or edema. Cisterns are patent. Cerebellar atrophy as well. Vascular: No hyperdense vessel or unexpected calcification. Skull: Normal. Negative for fracture or focal lesion. Sinuses/Orbits: Minor maxillary mucosal thickening. No sinus air-fluid level or significant opacification. Orbits are symmetric. No acute finding. Other: None. IMPRESSION: Stable atrophy and chronic white matter microvascular ischemic changes. No acute intracranial abnormality by noncontrast CT. Electronically Signed   By: Jerilynn Mages.  Shick M.D.   On: 05/28/2019 14:46   MR ABDOMEN MRCP WO CONTRAST  Result Date: 05/28/2019 CLINICAL DATA:  Rule out CBD stone EXAM: MRI ABDOMEN WITHOUT CONTRAST  (INCLUDING MRCP) TECHNIQUE: Multiplanar multisequence MR imaging of the abdomen was performed. Heavily T2-weighted images of the biliary and pancreatic ducts were obtained, and three-dimensional MRCP images were rendered by post processing. COMPARISON:  CT abdomen pelvis, 05/27/2018 FINDINGS: Examination is generally limited by patient motion and breath motion artifact. By technologist report, patient was agitated and yelling in scanner and unable to complete exam. Lower chest: No acute findings. Hepatobiliary: No mass or other parenchymal abnormality identified. Multiple small gallstones in the dependent gallbladder. No biliary ductal dilatation or common bile duct calculus. Pancreas: No mass, inflammatory changes, or other parenchymal abnormality identified. Spleen:  Within normal limits in size and appearance. Adrenals/Urinary Tract: No masses identified. No evidence of hydronephrosis. Stomach/Bowel: Susceptibility artifact in the gastric body secondary to endoscopically placed clip. Visualized portions within the abdomen are unremarkable. Vascular/Lymphatic: No pathologically enlarged lymph nodes identified. No abdominal aortic aneurysm demonstrated. Other:  None. Musculoskeletal: No suspicious bone lesions identified. IMPRESSION: Examination is  generally limited by patient motion and breath motion artifact. By technologist report, patient was agitated and yelling in scanner and unable to complete exam. Within this limitation, cholelithiasis without evidence of acute cholecystitis. No evidence of common bile duct calculus. No biliary ductal dilatation. Electronically Signed   By: Cristie Hem  Laqueta Carina M.D.   On: 05/28/2019 20:02   US LIVER DOPPLER  Result Date: 05/30/2019 CLINICAL DATA:  Elevated LFTs EXAM: DUPLEX ULTRASOUND OF LIVER TECHNIQUE: Color and duplex Doppler ultrasound was performed to evaluate the hepatic in-flow and out-flow vessels. COMPARISON:  05/28/2019 FINDINGS: Portal Vein Velocities Main:  31 cm/sec Right:  22 cm/sec Left:  23 cm/sec Hepatic Vein Velocities Right:  34 cm/sec Middle:  53 cm/sec Left:  32 cm/sec Hepatic Artery Velocity:  253 cm/sec Splenic Vein Velocity:  34 cm/sec Varices: Not visualized Ascites: Absent Patent portal, hepatic and splenic veins with normal directional flow. Negative for portal vein occlusion or thrombus. Cholelithiasis evident. Mild splenic enlargement measuring 12.3 x 15 x 4.3 cm. Splenic volume 423 cc. IMPRESSION: Normal hepatic venous Doppler. Negative for portal vein occlusion or thrombus Cholelithiasis Mild splenomegaly Electronically Signed   By: Jerilynn Mages.  Shick M.D.   On: 05/30/2019 13:02    Labs: BNP (last 3 results) Recent Labs    05/28/19 1151  BNP 951.8*   Basic Metabolic Panel: Recent Labs  Lab 05/30/19 0416 05/31/19 0619 06/01/19 0133 06/02/19 0244 06/03/19 0309  NA 135 137 132* 135 133*  K 3.1* 3.3* 5.1 4.4 3.8  CL 103 107 104 107 103  CO2 23 21* 19* 21* 22  GLUCOSE 170* 197* 358* 364* 273*  BUN 57* 50* 50* 48* 43*  CREATININE 2.99* 2.55* 2.45* 2.16* 1.89*  CALCIUM 7.9* 7.8* 7.6* 7.9* 7.9*   Liver Function Tests: Recent Labs  Lab 05/30/19 0416 05/31/19 0619 06/01/19 0133 06/02/19 0244 06/03/19 0309  AST 149* 84* 104* 83* 80*  ALT 200* 142* 121* 97* 85*  ALKPHOS 593*  654* 697* 750* 862*  BILITOT 3.0* 1.3* 2.3* 1.0 1.5*  PROT 4.6* 4.3* 4.6* 4.6* 4.6*  ALBUMIN 1.8* 1.7* 1.8* 1.7* 1.7*   Recent Labs  Lab 05/28/19 1151  LIPASE 30   Recent Labs  Lab 05/29/19 0221  AMMONIA 51*   CBC: Recent Labs  Lab 05/28/19 1808 05/29/19 0221 05/30/19 0416 06/01/19 0133 06/02/19 0244  WBC 6.6 5.5 5.2 6.5 7.4  NEUTROABS  --  4.7  --   --   --   HGB 12.5 11.5* 11.3* 11.4* 10.4*  HCT 40.1 36.5 36.3 36.5 33.0*  MCV 83.0 81.5 83.1 82.0 81.1  PLT 149* 143* 155 143* 166   Cardiac Enzymes: No results for input(s): CKTOTAL, CKMB, CKMBINDEX, TROPONINI in the last 168 hours. BNP: Invalid input(s): POCBNP CBG: Recent Labs  Lab 06/02/19 1202 06/02/19 1629 06/02/19 2158 06/03/19 0756 06/03/19 1229  GLUCAP 194* 242* 296* 236* 169*   D-Dimer No results for input(s): DDIMER in the last 72 hours. Hgb A1c No results for input(s): HGBA1C in the last 72 hours. Lipid Profile No results for input(s): CHOL, HDL, LDLCALC, TRIG, CHOLHDL, LDLDIRECT in the last 72 hours. Thyroid function studies No results for input(s): TSH, T4TOTAL, T3FREE, THYROIDAB in the last 72 hours.  Invalid input(s): FREET3 Anemia work up No results for input(s): VITAMINB12, FOLATE, FERRITIN, TIBC, IRON, RETICCTPCT in the last 72 hours. Urinalysis    Component Value Date/Time   COLORURINE AMBER (A) 05/28/2019 1906   APPEARANCEUR CLOUDY (A) 05/28/2019 1906   LABSPEC 1.016 05/28/2019 1906   PHURINE 5.0 05/28/2019 1906   GLUCOSEU >=500 (A) 05/28/2019 1906   HGBUR LARGE (A) 05/28/2019 1906   BILIRUBINUR SMALL (A) 05/28/2019 1906   KETONESUR 5 (A) 05/28/2019 1906   PROTEINUR >=300 (A) 05/28/2019 1906   UROBILINOGEN 0.2 04/01/2014 1659   NITRITE NEGATIVE  05/28/2019 1906   LEUKOCYTESUR MODERATE (A) 05/28/2019 1906   Sepsis Labs Invalid input(s): PROCALCITONIN,  WBC,  LACTICIDVEN Microbiology Recent Results (from the past 240 hour(s))  Blood Culture (routine x 2)     Status: None    Collection Time: 05/28/19  1:30 PM   Specimen: BLOOD RIGHT FOREARM  Result Value Ref Range Status   Specimen Description BLOOD RIGHT FOREARM  Final   Special Requests   Final    BOTTLES DRAWN AEROBIC AND ANAEROBIC Blood Culture adequate volume   Culture   Final    NO GROWTH 5 DAYS Performed at [facility], [address], [place], [place] [address]    Report Status 06/02/2019 FINAL  Final  Blood Culture (routine x 2)     Status: None   Collection Time: 05/28/19  6:22 PM   Specimen: BLOOD LEFT HAND  Result Value Ref Range Status   Specimen Description BLOOD LEFT HAND  Final   Special Requests   Final    BOTTLES DRAWN AEROBIC ONLY Blood Culture results may not be optimal due to an inadequate volume of blood received in culture bottles   Culture   Final    NO GROWTH 5 DAYS Performed at [facility], [place] [address] [place], [place] [address]    Report Status 06/02/2019 FINAL  Final  Urine culture     Status: Abnormal   Collection Time: 05/28/19  6:31 PM   Specimen: In/Out Cath Urine  Result Value Ref Range Status   Specimen Description IN/OUT CATH URINE  Final   Special Requests   Final    NONE Performed at [facility], [place] [address], [place], [place] [address]    Culture 30,000 COLONIES/mL KLEBSIELLA PNEUMONIAE (A)  Final   Report Status 05/30/2019 FINAL  Final   Organism ID, Bacteria KLEBSIELLA PNEUMONIAE (A)  Final      Susceptibility   Klebsiella pneumoniae - MIC*    AMPICILLIN RESISTANT Resistant     CEFAZOLIN <=4 SENSITIVE Sensitive     CEFTRIAXONE <=0.25 SENSITIVE Sensitive     CIPROFLOXACIN <=0.25 SENSITIVE Sensitive     GENTAMICIN <=1 SENSITIVE Sensitive     IMIPENEM <=0.25 SENSITIVE Sensitive     NITROFURANTOIN 128 RESISTANT Resistant     TRIMETH/SULFA <=20 SENSITIVE Sensitive     AMPICILLIN/SULBACTAM 4 SENSITIVE Sensitive     PIP/TAZO <=4 SENSITIVE Sensitive     * 30,000 COLONIES/mL KLEBSIELLA PNEUMONIAE  SARS CORONAVIRUS 2 (TAT 6-24 HRS)  Nasopharyngeal Nasopharyngeal Swab     Status: None   Collection Time: 05/28/19  7:37 PM   Specimen: Nasopharyngeal Swab  Result Value Ref Range Status   SARS Coronavirus 2 NEGATIVE NEGATIVE Final    Comment: (NOTE) SARS-CoV-2 target nucleic acids are NOT DETECTED. The SARS-CoV-2 RNA is generally detectable in upper and lower respiratory specimens during the acute phase of infection. Negative results do not preclude SARS-CoV-2 infection, do not rule out co-infections with other pathogens, and should not be used as the sole basis for treatment or other patient management decisions. Negative results must be combined with clinical observations, patient history, and epidemiological information. The expected result is Negative. Fact Sheet for Patients: SugarRoll.be Fact Sheet for Healthcare Providers: https://www.woods-mathews.com/ This test is not yet approved or cleared by the [place] FDA and  has been authorized for detection and/or diagnosis of SARS-CoV-2 by FDA under an Emergency Use Authorization (EUA). This EUA will remain  in effect (meaning this test can be  used) for the duration of the COVID-19 declaration under Section 56 4(b)(1) of the Act, 21 U.S.C. section 360bbb-3(b)(1), unless the authorization is terminated or revoked sooner. Performed at [facility], [place] [address] [place], [place] [address]      Time coordinating discharge: 35  minutes  SIGNED: Antonieta Pert, MD  [facility] 06/03/2019, 2:58 PM  If 7PM-7AM, please contact night-coverage [email or web address]

## 2019-06-03 NOTE — TOC Progression Note (Signed)
Transition of Care [facility]) - Progression Note    Patient Details  Name: Susan [name] MRN: [id number] Date of Birth: 1950-11-11  [facility]) CM/SW Contact  Jacalyn Lefevre Edson Snowball, RN Phone Number: 06/03/2019, 12:05 PM  Clinical Narrative:    Patient discharge to home today.   Mateo Flow with [facility] aware. Patient was active with [facility] prior to admission and they have accepted her back.   Patient does not want CIR or SNF    Expected Discharge Plan: [place] Barriers to Discharge: Continued Medical Work up  Expected Discharge Plan and Services Expected Discharge Plan: [place] Choice: [place] arrangements for the past 2 months: Single Family Home Expected Discharge Date: 06/03/19               DME Arranged: N/A         HH Arranged: PT [place]: [place] ([place]) Date HH Agency Contacted: 06/02/19 Time [place]: 1301 Representative spoke with at [place]: [place] ([place]) Interventions    Readmission Risk Interventions No flowsheet data found.

## 2019-06-06 ENCOUNTER — Telehealth: Payer: Self-pay | Admitting: *Deleted

## 2019-06-06 NOTE — Telephone Encounter (Signed)
Received call from Amy, [facility] PT with [facility].   Reports that patient has returned home from recent hospitalization and requested resumption of care orders.   VO given.

## 2019-06-06 NOTE — Telephone Encounter (Signed)
Agree with below. Addendum:    I believe that she has limits with moving and including, toileting, bathing, feeding, dressing and grooming. I believe the power wheelchair is needed for pt to be able to perform ADL's in her home

## 2019-06-07 ENCOUNTER — Other Ambulatory Visit: Payer: Self-pay

## 2019-06-07 ENCOUNTER — Ambulatory Visit (INDEPENDENT_AMBULATORY_CARE_PROVIDER_SITE_OTHER): Payer: PPO | Admitting: Family Medicine

## 2019-06-07 ENCOUNTER — Encounter: Payer: Self-pay | Admitting: Family Medicine

## 2019-06-07 VITALS — BP 122/74 | HR 82 | Temp 98.1°F | Resp 14 | Ht 66.0 in | Wt 270.0 lb

## 2019-06-07 DIAGNOSIS — R262 Difficulty in walking, not elsewhere classified: Secondary | ICD-10-CM | POA: Diagnosis not present

## 2019-06-07 DIAGNOSIS — R7989 Other specified abnormal findings of blood chemistry: Secondary | ICD-10-CM

## 2019-06-07 DIAGNOSIS — R609 Edema, unspecified: Secondary | ICD-10-CM

## 2019-06-07 DIAGNOSIS — N189 Chronic kidney disease, unspecified: Secondary | ICD-10-CM | POA: Diagnosis not present

## 2019-06-07 DIAGNOSIS — B9689 Other specified bacterial agents as the cause of diseases classified elsewhere: Secondary | ICD-10-CM

## 2019-06-07 DIAGNOSIS — B379 Candidiasis, unspecified: Secondary | ICD-10-CM | POA: Diagnosis not present

## 2019-06-07 DIAGNOSIS — I5032 Chronic diastolic (congestive) heart failure: Secondary | ICD-10-CM | POA: Diagnosis not present

## 2019-06-07 DIAGNOSIS — N179 Acute kidney failure, unspecified: Secondary | ICD-10-CM | POA: Diagnosis not present

## 2019-06-07 DIAGNOSIS — N390 Urinary tract infection, site not specified: Secondary | ICD-10-CM | POA: Diagnosis not present

## 2019-06-07 DIAGNOSIS — R600 Localized edema: Secondary | ICD-10-CM

## 2019-06-07 LAB — URINALYSIS, ROUTINE W REFLEX MICROSCOPIC
Bilirubin Urine: NEGATIVE
Hyaline Cast: NONE SEEN /LPF
Ketones, ur: NEGATIVE
Leukocytes,Ua: NEGATIVE
Nitrite: NEGATIVE
Specific Gravity, Urine: 1.02 (ref 1.001–1.03)
pH: 5.5 (ref 5.0–8.0)

## 2019-06-07 LAB — MICROSCOPIC MESSAGE

## 2019-06-07 MED ORDER — FLUCONAZOLE 100 MG PO TABS: ORAL_TABLET | ORAL | 0 refills | Status: DC

## 2019-06-07 NOTE — Progress Notes (Signed)
Subjective:    Patient ID: Susan [name], female    DOB: 1951/05/26, 69 y.o.   MRN: [id number]  Patient presents for Hospital F/U (sepsis- gallbladder, liver issues, bowel obstruction- nausea) and Edema (4 lb weight gain from hospital- edema to BLE)  Patient here for hospital follow-up.  Her last visit was with me 2 weeks ago.  Prior to that visit she was having intermittent episodes of hypoglycemia along with GI upset vomiting dizzy spells.  I have recommended she go to the emergency room at that time but she did not.  When she came into the office the hypoglycemia spells had stopped. She presented back to the emergency room on January 2 with altered mental status she had elevated ALP and liver function test.  She is also having abdominal pain.  Gastroenterology was consulted secondary to cholestatic pattern of her elevated liver function test.  Suspected it was multifactorial however CT scan did show cholelithiasis MRCP was also performed.  She was initially on antibiotics for possible cholangitis but these were discontinued.  Encephalopathy this was thought to be multifactorial she also had Klebsiella in her urine culture which she completed antibiotics for.  Mild anemia this was thought to be dilutional she did not require any blood transfusion.  Iron level as well as ferritin B12 were normal.  Acute  on chronic renal failure her creatinine ranged from 1.9-2.4 she was at her baseline at discharge.  Coronary artery disease her statin drug was held in the setting of her abnormal liver function test.  As of heart failure she did have a temporary increase in her weight secondary to the IV fluids needed for hydration.  She is on furosemide 60 mg twice a day.  Her weight at discharge was 271 pounds, she takes melazone once week, "unless she has somewhere to go" Feels SOB, weight still up 4-5lbs from 2 weeks ago, legs feel tight   Gait instability deconditioning was recommended that she go to rehab  however she declined was sent home with home health.  Per recommendation from the hospital she needs a repeat metabolic panel and CBC to ensure stability.  Needs outpatient appointment with gastroenterology Dr. Collene Mares states that this is scheduled for this Friday.  DM- has not scheduled with endocrinology, missed last appt , CBG ata lunch wasa  324   , she has been giving 15 units of in insulin in the morning for fastings greater than 300  He has noted some tenderness on her left upper abdomen towards her flank region.  Feels like there is something on her skin.  It is tender when she touches the area.  She has a Marine scientist and PT coming to home twice week, [facility]    Review Of Systems:  GEN- + fatigue, fever, weight loss,weakness, recent illness HEENT- denies eye drainage, change in vision, nasal discharge, CVS- denies chest pain, palpitations RESP- + SOB, cough, wheeze ABD- denies N/V, change in stools,+ abd pain GU- denies dysuria, hematuria, dribbling, incontinence MSK- denies joint pain, +muscle aches, injury Neuro- denies headache, dizziness, syncope, seizure activity       Objective:    BP 122/74   Pulse 82   Temp 98.1 F (36.7 C) (Temporal)   Resp 14   Ht 5\' 6"  (1.676 m)   Wt 270 lb (122.5 kg)   SpO2 96%   BMI 43.58 kg/m  GEN- NAD, alert and oriented x3, nontoxic-appearing walks with a cane unsteady gait HEENT- PERRL, EOMI, non injected sclera, pink  conjunctiva, MMM, oropharynx clear Neck- Supple, no thyromegaly CVS- RRR, no murmur RESP-CTAB ABD-NABS,soft,ND, TTP LUQ no rebound, no guarding Skin- small excoriation no erythema  Psych normal affect and mood EXT-chronic venous stasis changes, TTP on legs  Pulses- Radial 2+       Assessment & Plan:    Recommend she reschedule her endocrinology visit to help with her insulin pump as well as her nephrology visit Problem List Items Addressed This Visit      Unprioritized   Acute kidney injury superimposed on chronic  kidney disease ([place])    Recheck renal function today.  She is followed by nephrology she is going to reschedule her appointment.      Chronic diastolic CHF (congestive heart failure) (HCC) - Primary (Chronic)    Weight is still above her baseline.  She is still short of breath.  We will have her take an extra dose of metolazone on Friday.  Advised her not to skip her dose for tomorrow.  We will continue the furosemide at 60 mg twice a day.      Relevant Orders   Comprehensive metabolic panel   CBC with Differential   Impaired ambulation    Ambulating with a walker.  She currently has home health physical therapy and nursing.   Elevated liver function test unknown specific cause of this.  There is no cholangitis.  She has follow-up with GI this week.  I will repeat the liver function tests and forwarded to her gastroenterologist.  She is avoiding her statin drug at this time.   Still has significant fatigue this is multifactorial she has multiple organs that have been compromised through the years and acutely.  Discussed with her husband if she continues to decompensate especially with her breathing or weakness he should get her back to the emergency room to be reevaluated.   She did have some pain on her left upper quadrant between the skin folds there was no rash noted on this exam skin was intact except for a small superficial excoriation.  I did advise her of this breaks out into a rash it could be possibly shingles and she should alert me.  Klebsiella urinary tract infection we will recheck her urine culture.      Peripheral edema    Other Visit Diagnoses    UTI due to Klebsiella species       Relevant Medications   fluconazole (DIFLUCAN) 100 MG tablet   Other Relevant Orders   Urinalysis, Routine w reflex microscopic   Urine Culture   Elevated LFTs       Yeast infection       Given diflucan   Relevant Medications   fluconazole (DIFLUCAN) 100 MG tablet      Note: This  dictation was prepared with Dragon dictation along with smaller phrase technology. Any transcriptional errors that result from this process are unintentional.

## 2019-06-07 NOTE — Assessment & Plan Note (Addendum)
Ambulating with a walker.  She currently has home health physical therapy and nursing.   Elevated liver function test unknown specific cause of this.  There is no cholangitis.  She has follow-up with GI this week.  I will repeat the liver function tests and forwarded to her gastroenterologist.  She is avoiding her statin drug at this time.   Still has significant fatigue this is multifactorial she has multiple organs that have been compromised through the years and acutely.  Discussed with her husband if she continues to decompensate especially with her breathing or weakness he should get her back to the emergency room to be reevaluated.   She did have some pain on her left upper quadrant between the skin folds there was no rash noted on this exam skin was intact except for a small superficial excoriation.  I did advise her of this breaks out into a rash it could be possibly shingles and she should alert me.  Klebsiella urinary tract infection we will recheck her urine culture.

## 2019-06-07 NOTE — Assessment & Plan Note (Signed)
Recheck renal function today.  She is followed by nephrology she is going to reschedule her appointment.

## 2019-06-07 NOTE — Assessment & Plan Note (Signed)
>>  ASSESSMENT AND PLAN FOR CHRONIC DIASTOLIC CHF (CONGESTIVE HEART FAILURE) (HCC) WRITTEN ON 06/07/2019  4:44 PM BY [place], KAWANTA F  Weight is still above her baseline.  She is still short of breath.  We will have her take an extra dose of metolazone on Friday.  Advised her not to skip her dose for tomorrow.  We will continue the furosemide at 60 mg twice a day.

## 2019-06-07 NOTE — Assessment & Plan Note (Signed)
Weight is still above her baseline.  She is still short of breath.  We will have her take an extra dose of metolazone on Friday.  Advised her not to skip her dose for tomorrow.  We will continue the furosemide at 60 mg twice a day.

## 2019-06-07 NOTE — Patient Instructions (Addendum)
Call and schedule with Dr. [name]  Reschedule with Dr. Quitman Livings kidney doctor  Take extra dose of metolazone on Friday F/U as previous

## 2019-06-08 LAB — CBC WITH DIFFERENTIAL/PLATELET
Absolute Monocytes: 959 cells/uL — ABNORMAL HIGH (ref 200–950)
Basophils Absolute: 44 cells/uL (ref 0–200)
Basophils Relative: 0.4 %
Eosinophils Absolute: 403 cells/uL (ref 15–500)
Eosinophils Relative: 3.7 %
HCT: 34.7 % — ABNORMAL LOW (ref 35.0–45.0)
Hemoglobin: 10.5 g/dL — ABNORMAL LOW (ref 11.7–15.5)
Lymphs Abs: 959 cells/uL (ref 850–3900)
MCH: 25.2 pg — ABNORMAL LOW (ref 27.0–33.0)
MCHC: 30.3 g/dL — ABNORMAL LOW (ref 32.0–36.0)
MCV: 83.2 fL (ref 80.0–100.0)
MPV: 12.7 fL — ABNORMAL HIGH (ref 7.5–12.5)
Monocytes Relative: 8.8 %
Neutro Abs: 8535 cells/uL — ABNORMAL HIGH (ref 1500–7800)
Neutrophils Relative %: 78.3 %
Platelets: 312 10*3/uL (ref 140–400)
RBC: 4.17 10*6/uL (ref 3.80–5.10)
RDW: 13.7 % (ref 11.0–15.0)
Total Lymphocyte: 8.8 %
WBC: 10.9 10*3/uL — ABNORMAL HIGH (ref 3.8–10.8)

## 2019-06-08 LAB — COMPREHENSIVE METABOLIC PANEL
AG Ratio: 1.1 (calc) (ref 1.0–2.5)
ALT: 39 U/L — ABNORMAL HIGH (ref 6–29)
AST: 23 U/L (ref 10–35)
Albumin: 2.8 g/dL — ABNORMAL LOW (ref 3.6–5.1)
Alkaline phosphatase (APISO): 743 U/L — ABNORMAL HIGH (ref 37–153)
BUN/Creatinine Ratio: 29 (calc) — ABNORMAL HIGH (ref 6–22)
BUN: 48 mg/dL — ABNORMAL HIGH (ref 7–25)
CO2: 26 mmol/L (ref 20–32)
Calcium: 8.4 mg/dL — ABNORMAL LOW (ref 8.6–10.4)
Chloride: 98 mmol/L (ref 98–110)
Creat: 1.65 mg/dL — ABNORMAL HIGH (ref 0.60–0.93)
Globulin: 2.6 g/dL (calc) (ref 1.9–3.7)
Glucose, Bld: 430 mg/dL — ABNORMAL HIGH (ref 65–99)
Potassium: 3.8 mmol/L (ref 3.5–5.3)
Sodium: 134 mmol/L — ABNORMAL LOW (ref 135–146)
Total Bilirubin: 0.8 mg/dL (ref 0.2–1.2)
Total Protein: 5.4 g/dL — ABNORMAL LOW (ref 6.1–8.1)

## 2019-06-09 DIAGNOSIS — R748 Abnormal levels of other serum enzymes: Secondary | ICD-10-CM | POA: Diagnosis not present

## 2019-06-09 DIAGNOSIS — Z7689 Persons encountering health services in other specified circumstances: Secondary | ICD-10-CM | POA: Diagnosis not present

## 2019-06-09 DIAGNOSIS — K5730 Diverticulosis of large intestine without perforation or abscess without bleeding: Secondary | ICD-10-CM | POA: Diagnosis not present

## 2019-06-09 DIAGNOSIS — D509 Iron deficiency anemia, unspecified: Secondary | ICD-10-CM | POA: Diagnosis not present

## 2019-06-09 DIAGNOSIS — K8080 Other cholelithiasis without obstruction: Secondary | ICD-10-CM | POA: Diagnosis not present

## 2019-06-09 DIAGNOSIS — Z86010 Personal history of colonic polyps: Secondary | ICD-10-CM | POA: Diagnosis not present

## 2019-06-09 LAB — URINE CULTURE
MICRO NUMBER:: 10032941
Result:: NO GROWTH
SPECIMEN QUALITY:: ADEQUATE

## 2019-06-14 ENCOUNTER — Encounter: Payer: Self-pay | Admitting: Family Medicine

## 2019-06-14 ENCOUNTER — Ambulatory Visit (INDEPENDENT_AMBULATORY_CARE_PROVIDER_SITE_OTHER): Payer: PPO | Admitting: Family Medicine

## 2019-06-14 ENCOUNTER — Other Ambulatory Visit: Payer: Self-pay

## 2019-06-14 VITALS — BP 126/82 | HR 76 | Temp 98.1°F | Resp 16 | Ht 66.0 in | Wt 270.8 lb

## 2019-06-14 DIAGNOSIS — L03115 Cellulitis of right lower limb: Secondary | ICD-10-CM

## 2019-06-14 DIAGNOSIS — R609 Edema, unspecified: Secondary | ICD-10-CM

## 2019-06-14 DIAGNOSIS — I739 Peripheral vascular disease, unspecified: Secondary | ICD-10-CM

## 2019-06-14 DIAGNOSIS — L03116 Cellulitis of left lower limb: Secondary | ICD-10-CM | POA: Diagnosis not present

## 2019-06-14 DIAGNOSIS — I872 Venous insufficiency (chronic) (peripheral): Secondary | ICD-10-CM

## 2019-06-14 DIAGNOSIS — L03119 Cellulitis of unspecified part of limb: Secondary | ICD-10-CM | POA: Diagnosis not present

## 2019-06-14 DIAGNOSIS — R600 Localized edema: Secondary | ICD-10-CM

## 2019-06-14 MED ORDER — DOXYCYCLINE HYCLATE 100 MG PO TABS: 100.0000 mg | ORAL_TABLET | Freq: Two times a day (BID) | ORAL | 0 refills | Status: DC

## 2019-06-14 NOTE — Patient Instructions (Addendum)
Unna boots to be removed by nurse Take antibiotics twice a day  F/U as previous

## 2019-06-14 NOTE — Progress Notes (Addendum)
Subjective:    Patient ID: Susan [name], female    DOB: 02-04-1951, 69 y.o.   MRN: [id number]  Patient presents for Cellulitis (BLE- weeping and draining)  Patient here secondary to increased swelling weeping of her legs and redness.  She states that she believes this started over the weekend.  Her dogs were looking at her legs she noticed there was blood draining down and some clear fluid.  She is also had some increase in discomfort in her legs.  She has been trying to wear her compression hose but has been having more difficulty getting these on.  Her home health physical therapist came out yesterday advised her to come to the office to have her legs evaluated.  She has not had any fever chills vomiting.  She was also seen by gastroenterology for elevated liver function test she does not understand what is going on.  States that she was told to come back in 3 months.  I do not have the report in front of me but her last set of labs which I drew last week her liver function test as well as her renal function was much improved.   Review Of Systems:  GEN- denies fatigue, fever, weight loss,weakness, recent illness HEENT- denies eye drainage, change in vision, nasal discharge, CVS- denies chest pain, palpitations RESP- denies SOB, cough, wheeze MSK- + joint pain, muscle aches, injury        Objective:    BP 126/82   Pulse 76   Temp 98.1 F (36.7 C) (Temporal)   Resp 16   Ht 5\' 6"  (1.676 m)   Wt 270 lb 12.8 oz (122.8 kg)   SpO2 98%   BMI 43.71 kg/m  GEN- NAD, alert and oriented x3 CVS- RRR, no murmur RESP-CTAB EXT- venous stasis 2+ mild WEEPING edema  L >r  / right partial metatarsal amputation , right leg 3 superfical ulcerations over shin, 1 nickle size superficial ulceration on left shin, erythema over both legs and top of left foot Pulses- Radial 2+ DP diminished       Pt gave permission to take photo           Assessment & Plan:    We will obtain the  gastroenterology note and review with patient. Problem List Items Addressed This Visit      Unprioritized   Peripheral edema   PVD (peripheral vascular disease) ([place]) - Primary    Known peripheral vascular disease with chronic edema which is worsening.  She also has some weeping and superficial ulcers secondary to her edema stasis changes.  We will cover her for cellulitis based on the redness that is spread the increased pain.  She will start doxycycline 100 mg twice a day as she did recently finish up Keflex secondary to Klebsiella UTI.  We have placed her legs in Unna boots which she has had before.  These are not wrapped very tightly secondary to her vascular disease but should help with the ulcerations and wound healing.  We will have her home health nurse remove the Unna boot in 3 days and reassess the lesions.       Other Visit Diagnoses    Venous stasis dermatitis of both lower extremities       Cellulitis of lower extremity, unspecified laterality          Note: This dictation was prepared with Dragon dictation along with smaller phrase technology. Any transcriptional errors that result from this process are unintentional.

## 2019-06-14 NOTE — Assessment & Plan Note (Signed)
Known peripheral vascular disease with chronic edema which is worsening.  She also has some weeping and superficial ulcers secondary to her edema stasis changes.  We will cover her for cellulitis based on the redness that is spread the increased pain.  She will start doxycycline 100 mg twice a day as she did recently finish up Keflex secondary to Klebsiella UTI.  We have placed her legs in Unna boots which she has had before.  These are not wrapped very tightly secondary to her vascular disease but should help with the ulcerations and wound healing.  We will have her home health nurse remove the Unna boot in 3 days and reassess the lesions.

## 2019-06-15 DIAGNOSIS — E1122 Type 2 diabetes mellitus with diabetic chronic kidney disease: Secondary | ICD-10-CM | POA: Diagnosis not present

## 2019-06-15 DIAGNOSIS — D631 Anemia in chronic kidney disease: Secondary | ICD-10-CM | POA: Diagnosis not present

## 2019-06-15 DIAGNOSIS — N184 Chronic kidney disease, stage 4 (severe): Secondary | ICD-10-CM | POA: Diagnosis not present

## 2019-06-15 DIAGNOSIS — G4733 Obstructive sleep apnea (adult) (pediatric): Secondary | ICD-10-CM | POA: Diagnosis not present

## 2019-06-15 DIAGNOSIS — I509 Heart failure, unspecified: Secondary | ICD-10-CM | POA: Diagnosis not present

## 2019-06-16 DIAGNOSIS — Z9641 Presence of insulin pump (external) (internal): Secondary | ICD-10-CM | POA: Diagnosis not present

## 2019-06-16 DIAGNOSIS — I10 Essential (primary) hypertension: Secondary | ICD-10-CM | POA: Diagnosis not present

## 2019-06-16 DIAGNOSIS — E1165 Type 2 diabetes mellitus with hyperglycemia: Secondary | ICD-10-CM | POA: Diagnosis not present

## 2019-06-16 DIAGNOSIS — R809 Proteinuria, unspecified: Secondary | ICD-10-CM | POA: Diagnosis not present

## 2019-06-16 DIAGNOSIS — E11319 Type 2 diabetes mellitus with unspecified diabetic retinopathy without macular edema: Secondary | ICD-10-CM | POA: Diagnosis not present

## 2019-06-16 DIAGNOSIS — N189 Chronic kidney disease, unspecified: Secondary | ICD-10-CM | POA: Diagnosis not present

## 2019-06-16 DIAGNOSIS — E11621 Type 2 diabetes mellitus with foot ulcer: Secondary | ICD-10-CM | POA: Diagnosis not present

## 2019-06-16 DIAGNOSIS — E039 Hypothyroidism, unspecified: Secondary | ICD-10-CM | POA: Diagnosis not present

## 2019-06-16 DIAGNOSIS — G609 Hereditary and idiopathic neuropathy, unspecified: Secondary | ICD-10-CM | POA: Diagnosis not present

## 2019-06-24 ENCOUNTER — Other Ambulatory Visit: Payer: Self-pay | Admitting: Family Medicine

## 2019-06-27 DIAGNOSIS — Z8673 Personal history of transient ischemic attack (TIA), and cerebral infarction without residual deficits: Secondary | ICD-10-CM | POA: Diagnosis not present

## 2019-06-27 DIAGNOSIS — N183 Chronic kidney disease, stage 3 unspecified: Secondary | ICD-10-CM | POA: Diagnosis not present

## 2019-06-27 DIAGNOSIS — I5032 Chronic diastolic (congestive) heart failure: Secondary | ICD-10-CM | POA: Diagnosis not present

## 2019-06-27 DIAGNOSIS — N179 Acute kidney failure, unspecified: Secondary | ICD-10-CM | POA: Diagnosis not present

## 2019-06-27 DIAGNOSIS — E1151 Type 2 diabetes mellitus with diabetic peripheral angiopathy without gangrene: Secondary | ICD-10-CM | POA: Diagnosis not present

## 2019-06-27 DIAGNOSIS — N184 Chronic kidney disease, stage 4 (severe): Secondary | ICD-10-CM | POA: Diagnosis not present

## 2019-06-27 DIAGNOSIS — I130 Hypertensive heart and chronic kidney disease with heart failure and stage 1 through stage 4 chronic kidney disease, or unspecified chronic kidney disease: Secondary | ICD-10-CM | POA: Diagnosis not present

## 2019-06-27 DIAGNOSIS — E039 Hypothyroidism, unspecified: Secondary | ICD-10-CM | POA: Diagnosis not present

## 2019-06-27 DIAGNOSIS — K921 Melena: Secondary | ICD-10-CM | POA: Diagnosis not present

## 2019-06-27 DIAGNOSIS — C189 Malignant neoplasm of colon, unspecified: Secondary | ICD-10-CM | POA: Diagnosis not present

## 2019-06-27 DIAGNOSIS — I872 Venous insufficiency (chronic) (peripheral): Secondary | ICD-10-CM | POA: Diagnosis not present

## 2019-06-27 DIAGNOSIS — E1165 Type 2 diabetes mellitus with hyperglycemia: Secondary | ICD-10-CM | POA: Diagnosis not present

## 2019-06-27 DIAGNOSIS — E785 Hyperlipidemia, unspecified: Secondary | ICD-10-CM | POA: Diagnosis not present

## 2019-06-27 DIAGNOSIS — E1122 Type 2 diabetes mellitus with diabetic chronic kidney disease: Secondary | ICD-10-CM | POA: Diagnosis not present

## 2019-06-29 ENCOUNTER — Telehealth: Payer: Self-pay | Admitting: Family Medicine

## 2019-06-29 NOTE — Telephone Encounter (Signed)
Call placed to patient. LMTRC.  

## 2019-06-29 NOTE — Telephone Encounter (Signed)
Janett Billow from [facility] calling to discuss getting an order for this patient and also about a sore on her foot [phone]

## 2019-06-30 ENCOUNTER — Other Ambulatory Visit: Payer: Self-pay

## 2019-06-30 ENCOUNTER — Encounter: Payer: Self-pay | Admitting: Physician Assistant

## 2019-06-30 ENCOUNTER — Ambulatory Visit (INDEPENDENT_AMBULATORY_CARE_PROVIDER_SITE_OTHER): Payer: PPO | Admitting: Orthopedic Surgery

## 2019-06-30 VITALS — Ht 66.0 in | Wt 270.0 lb

## 2019-06-30 DIAGNOSIS — I872 Venous insufficiency (chronic) (peripheral): Secondary | ICD-10-CM

## 2019-06-30 NOTE — Telephone Encounter (Signed)
Returned call to [place].   Reports that she had some concerns about wound on patient foot. States that she had patient schedule appointment with Dr. Sharol Given to evaluate.

## 2019-07-01 ENCOUNTER — Telehealth: Payer: Self-pay | Admitting: Orthopedic Surgery

## 2019-07-01 NOTE — Telephone Encounter (Signed)
[name] from [facility] called. She would like new orders for L leg and verbal orders for R leg to be changed 1x wk. Her call back number is [phone]

## 2019-07-01 NOTE — Telephone Encounter (Signed)
I called and sw HHN to advise that BLE unna boot dressing changes with a donut to the dorsum of the left foot to be changed weekly for the next 4 weeks. To call with any questions.

## 2019-07-04 ENCOUNTER — Encounter: Payer: Self-pay | Admitting: Orthopedic Surgery

## 2019-07-04 NOTE — Progress Notes (Signed)
Office Visit Note   Patient: Susan [name]           Date of Birth: Sep 21, 1950           MRN: [id number] Visit Date: 06/30/2019              Requested by: Alycia Rossetti, MD [address] [place],  [place] [address] PCP: Alycia Rossetti, MD  Chief Complaint  Patient presents with  . Left Foot - Wound Check      HPI: Patient is a 69 year old woman who presents in follow-up for venous insufficiency ulceration to the left lower extremity.  Patient states she has developed a bony spur and has pending skin breakdown over this bony prominence through the midfoot.  Assessment & Plan: Visit Diagnoses:  1. Venous insufficiency (chronic) (peripheral)     Plan: We will follow-up in 4 weeks to change the compression wrap.  We will fax orders to [facility] home [facility] for wound wraps to be applied twice a week with a felt relieving donut over the bony prominence of the midfoot.    Follow-Up Instructions: Return in about 4 weeks (around 07/28/2019).   Ortho Exam  Patient is alert, oriented, no adenopathy, well-dressed, normal affect, normal respiratory effort. Examination patient's calf measures 42-1/2 cm in circumference on the left she has a ulcer over the base of the first metatarsal medial cuneiform secondary to arthritis to the midfoot.  She has a good biphasic dorsalis pedis and posterior tibial pulse by Doppler.  There is no redness no cellulitis no signs of infection no full-thickness wound.  Imaging: No results found. No images are attached to the encounter.  Labs: Lab Results  Component Value Date   HGBA1C 13.1 (H) 05/23/2019   HGBA1C 12.0 (H) 03/11/2019   HGBA1C 8.3 (H) 04/30/2018   CRP 14.4 (H) 05/28/2019   CRP 8.4 (H) 03/05/2018   LABURIC 9.1 (H) 10/12/2018   LABURIC 9.0 (H) 03/06/2018   REPTSTATUS 05/30/2019 FINAL 05/28/2019   CULT 30,000 COLONIES/mL KLEBSIELLA PNEUMONIAE (A) 05/28/2019   LABORGA KLEBSIELLA PNEUMONIAE (A) 05/28/2019     Lab Results  Component  Value Date   ALBUMIN 1.7 (L) 06/03/2019   ALBUMIN 1.7 (L) 06/02/2019   ALBUMIN 1.8 (L) 06/01/2019   LABURIC 9.1 (H) 10/12/2018   LABURIC 9.0 (H) 03/06/2018    Lab Results  Component Value Date   MG 1.6 02/27/2017   MG 2.1 07/10/2015   MG 1.7 09/21/2013   No results found for: VD25OH  No results found for: PREALBUMIN CBC EXTENDED Latest Ref Rng & Units 06/07/2019 06/02/2019 06/01/2019  WBC 3.8 - 10.8 Thousand/uL 10.9(H) 7.4 6.5  RBC 3.80 - 5.10 Million/uL 4.17 4.07 4.45  HGB 11.7 - 15.5 g/dL 10.5(L) 10.4(L) 11.4(L)  HCT 35.0 - 45.0 % 34.7(L) 33.0(L) 36.5  PLT 140 - 400 Thousand/uL 312 166 143(L)  NEUTROABS 1,500 - 7,800 cells/uL 8,535(H) - -  LYMPHSABS 850 - 3,900 cells/uL 959 - -     Body mass index is 43.58 kg/m.  Orders:  No orders of the defined types were placed in this encounter.  No orders of the defined types were placed in this encounter.    Procedures: No procedures performed  Clinical Data: No additional findings.  ROS:  All other systems negative, except as noted in the HPI. Review of Systems  Objective: Vital Signs: Ht 5\' 6"  (1.676 m)   Wt 270 lb (122.5 kg)   BMI 43.58 kg/m   Specialty  Comments:  No specialty comments available.  PMFS History: Patient Active Problem List   Diagnosis Date Noted  . Common bile duct (CBD) obstruction 05/28/2019  . Benign neoplasm of ascending colon   . Benign neoplasm of transverse colon   . Benign neoplasm of descending colon   . Benign neoplasm of sigmoid colon   . Gastric polyps   . Hyperkalemia 03/11/2019  . Prolonged QT interval 03/11/2019  . Onychomycosis 06/21/2018  . Pressure injury of skin 03/19/2018  . Syncope 03/18/2018  . Osteomyelitis of second toe of right foot ([place])   . Toe osteomyelitis, right ([place])   . 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 ([place])   . PVD (peripheral vascular disease) ([place]) 10/26/2017  . Hypothyroid 07/27/2017  . PAH (pulmonary artery hypertension)  ([place])   . Impaired ambulation 07/19/2017  . Leg cramps 02/27/2017  . Peripheral edema 01/12/2017  . Diabetic neuropathy ([place]) 11/12/2016  . 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 mellitus ([place]) 10/24/2015  . Anemia 10/03/2015  . Generalized anxiety disorder 10/03/2015  . Insomnia 10/03/2015  . Chest pain   . Gastritis 06/07/2015  . Diabetes mellitus type II, uncontrolled ([place]) 06/07/2015  . Chronic diastolic CHF (congestive heart failure) ([place]) 06/07/2015  . Non compliance with medical treatment 04/17/2014  . Rotator cuff tear 03/14/2014  . Morbid obesity ([place]) 09/23/2013  . Acute kidney injury superimposed on chronic kidney disease ([place]) 12/25/2012  . Hypotension 12/25/2012  . Urinary incontinence   . MDD (major depressive disorder) 11/12/2010  . RBBB (right bundle branch block)   . CAD (coronary artery disease)   . Hyperlipemia 01/22/2009  . Essential hypertension 01/22/2009   Past Medical History:  Diagnosis Date  . Acute MI ([place]) 1999; 2007  . Anemia    hx  . Anginal pain ([place])   . Anxiety   . ARF (acute renal failure) ([place]) 06/2017   [facility]  . Arthritis    "generalized" (03/15/2014)  . CAD (coronary artery disease)    MI in 2000 - MI  2007 - treated bare metal stent (no nuclear since then as 9/11)  . Carotid artery disease ([place])   . CHF (congestive heart failure) ([place])   . Chronic diastolic heart failure (HCC)    a) ECHO (08/2013) EF 55-60% and RV function nl b) RHC (08/2013) RA 4, RV 30/5/7, PA 25/10 (16), PCWP 7, Fick CO/CI 6.3/2.7, PVR 1.5 WU, PA 61 and 66%  . Daily headache    "~ every other day; since I fell in June" (03/15/2014)  . Depression   . Dyslipidemia   . Dyspnea   . Exertional shortness of breath   . HTN (hypertension)   . Hypothyroidism   . Neuropathy   . Obesity   . Osteoarthritis   . Peripheral neuropathy   . PONV (postoperative nausea and vomiting)   . RBBB (right bundle branch block)    Old  . Stroke [facility])    mini strokes  .  Syncope    likely due to low blood sugar  . Tachycardia    Sinus tachycardia  . Type II diabetes mellitus (HCC)    Type II  . Urinary incontinence   . Venous insufficiency     Family History  Problem Relation Age of Onset  . Heart attack Mother 47    Past Surgical History:  Procedure Laterality Date  . ABDOMINAL HYSTERECTOMY  1980's  . AMPUTATION Right 02/24/2018   Procedure: RIGHT FOOT  GREAT TOE AND 2ND TOE AMPUTATION;  Surgeon: Newt Minion, MD;  Location: [place];  Service: Orthopedics;  Laterality: Right;  . AMPUTATION Right 04/30/2018   Procedure: RIGHT TRANSMETATARSAL AMPUTATION;  Surgeon: Newt Minion, MD;  Location: [place];  Service: Orthopedics;  Laterality: Right;  . CATARACT EXTRACTION, BILATERAL Bilateral ?2013  . COLONOSCOPY W/ POLYPECTOMY    . COLONOSCOPY WITH PROPOFOL N/A 03/13/2019   Procedure: COLONOSCOPY WITH PROPOFOL;  Surgeon: Jerene Bears, MD;  Location: [place];  Service: Gastroenterology;  Laterality: N/A;  . [place]; 2007   "1 + 1"  . ESOPHAGOGASTRODUODENOSCOPY (EGD) WITH PROPOFOL N/A 03/13/2019   Procedure: ESOPHAGOGASTRODUODENOSCOPY (EGD) WITH PROPOFOL;  Surgeon: Jerene Bears, MD;  Location: [facility] ENDOSCOPY;  Service: Gastroenterology;  Laterality: N/A;  . EYE SURGERY Bilateral    lazer  . HEMOSTASIS CLIP PLACEMENT  03/13/2019   Procedure: HEMOSTASIS CLIP PLACEMENT;  Surgeon: Jerene Bears, MD;  Location: [facility] ENDOSCOPY;  Service: Gastroenterology;;  . KNEE ARTHROSCOPY Left 10/25/2006  . POLYPECTOMY  03/13/2019   Procedure: POLYPECTOMY;  Surgeon: Jerene Bears, MD;  Location: [place];  Service: Gastroenterology;;  . RIGHT HEART CATH N/A 07/24/2017   Procedure: RIGHT HEART CATH;  Surgeon: Jolaine Artist, MD;  Location: [place] CV LAB;  Service: Cardiovascular;  Laterality: N/A;  . RIGHT HEART CATHETERIZATION N/A 09/22/2013   Procedure: RIGHT HEART CATH;  Surgeon: Jolaine Artist, MD;  Location: [facility]  CATH LAB;  Service: Cardiovascular;  Laterality: N/A;  . SHOULDER ARTHROSCOPY WITH OPEN ROTATOR CUFF REPAIR Right 03/14/2014   Procedure: RIGHT SHOULDER ARTHROSCOPY WITH BICEPS RELEASE, OPEN SUBSCAPULA REPAIR, OPEN SUPRASPINATUS REPAIR.;  Surgeon: Meredith Pel, MD;  Location: [place];  Service: Orthopedics;  Laterality: Right;  . TOE AMPUTATION Right 02/24/2018   GREAT TOE AND 2ND TOE AMPUTATION  . TUBAL LIGATION  1970's   Social History   Occupational History    Employer: UNEMPLOYED  Tobacco Use  . Smoking status: Former Smoker    Packs/day: 3.00    Years: 32.00    Pack years: 96.00    Types: Cigarettes    Quit date: 10/24/1997    Years since quitting: 21.7  . Smokeless tobacco: Never Used  Substance and Sexual Activity  . Alcohol use: Not Currently    Comment: "might have 2-3 daiquiris in the summer"  . Drug use: No  . Sexual activity: Never

## 2019-07-08 ENCOUNTER — Other Ambulatory Visit: Payer: Self-pay | Admitting: *Deleted

## 2019-07-08 MED ORDER — TRAZODONE HCL 100 MG PO TABS: 150.0000 mg | ORAL_TABLET | Freq: Every evening | ORAL | 1 refills | Status: DC | PRN

## 2019-07-15 ENCOUNTER — Telehealth: Payer: Self-pay | Admitting: Orthopedic Surgery

## 2019-07-15 NOTE — Telephone Encounter (Signed)
Lm on vm to advise ok to HHN orders below.

## 2019-07-15 NOTE — Telephone Encounter (Signed)
Received call from [facility] -nurse with [facility] needing verbal orders for continued nursing 1 Wk 4. The number to contact [name] is [phone]

## 2019-07-18 DIAGNOSIS — E039 Hypothyroidism, unspecified: Secondary | ICD-10-CM | POA: Diagnosis not present

## 2019-07-18 DIAGNOSIS — E1165 Type 2 diabetes mellitus with hyperglycemia: Secondary | ICD-10-CM | POA: Diagnosis not present

## 2019-07-18 DIAGNOSIS — I10 Essential (primary) hypertension: Secondary | ICD-10-CM | POA: Diagnosis not present

## 2019-07-18 DIAGNOSIS — Z9641 Presence of insulin pump (external) (internal): Secondary | ICD-10-CM | POA: Diagnosis not present

## 2019-07-18 DIAGNOSIS — G609 Hereditary and idiopathic neuropathy, unspecified: Secondary | ICD-10-CM | POA: Diagnosis not present

## 2019-07-19 DIAGNOSIS — L97919 Non-pressure chronic ulcer of unspecified part of right lower leg with unspecified severity: Secondary | ICD-10-CM | POA: Diagnosis not present

## 2019-07-19 DIAGNOSIS — E1151 Type 2 diabetes mellitus with diabetic peripheral angiopathy without gangrene: Secondary | ICD-10-CM | POA: Diagnosis not present

## 2019-07-19 DIAGNOSIS — D631 Anemia in chronic kidney disease: Secondary | ICD-10-CM | POA: Diagnosis not present

## 2019-07-19 DIAGNOSIS — I130 Hypertensive heart and chronic kidney disease with heart failure and stage 1 through stage 4 chronic kidney disease, or unspecified chronic kidney disease: Secondary | ICD-10-CM | POA: Diagnosis not present

## 2019-07-19 DIAGNOSIS — I872 Venous insufficiency (chronic) (peripheral): Secondary | ICD-10-CM | POA: Diagnosis not present

## 2019-07-19 DIAGNOSIS — E1122 Type 2 diabetes mellitus with diabetic chronic kidney disease: Secondary | ICD-10-CM | POA: Diagnosis not present

## 2019-07-19 DIAGNOSIS — L97929 Non-pressure chronic ulcer of unspecified part of left lower leg with unspecified severity: Secondary | ICD-10-CM | POA: Diagnosis not present

## 2019-07-19 DIAGNOSIS — E1165 Type 2 diabetes mellitus with hyperglycemia: Secondary | ICD-10-CM | POA: Diagnosis not present

## 2019-07-19 DIAGNOSIS — N184 Chronic kidney disease, stage 4 (severe): Secondary | ICD-10-CM | POA: Diagnosis not present

## 2019-07-19 DIAGNOSIS — Z4800 Encounter for change or removal of nonsurgical wound dressing: Secondary | ICD-10-CM | POA: Diagnosis not present

## 2019-07-19 DIAGNOSIS — I5032 Chronic diastolic (congestive) heart failure: Secondary | ICD-10-CM | POA: Diagnosis not present

## 2019-07-19 DIAGNOSIS — I252 Old myocardial infarction: Secondary | ICD-10-CM | POA: Diagnosis not present

## 2019-07-19 DIAGNOSIS — I2510 Atherosclerotic heart disease of native coronary artery without angina pectoris: Secondary | ICD-10-CM | POA: Diagnosis not present

## 2019-07-20 DIAGNOSIS — I509 Heart failure, unspecified: Secondary | ICD-10-CM | POA: Diagnosis not present

## 2019-07-20 DIAGNOSIS — H3582 Retinal ischemia: Secondary | ICD-10-CM | POA: Diagnosis not present

## 2019-07-20 DIAGNOSIS — H3562 Retinal hemorrhage, left eye: Secondary | ICD-10-CM | POA: Diagnosis not present

## 2019-07-20 DIAGNOSIS — N184 Chronic kidney disease, stage 4 (severe): Secondary | ICD-10-CM | POA: Diagnosis not present

## 2019-07-20 DIAGNOSIS — E1122 Type 2 diabetes mellitus with diabetic chronic kidney disease: Secondary | ICD-10-CM | POA: Diagnosis not present

## 2019-07-20 DIAGNOSIS — D631 Anemia in chronic kidney disease: Secondary | ICD-10-CM | POA: Diagnosis not present

## 2019-07-20 DIAGNOSIS — E113513 Type 2 diabetes mellitus with proliferative diabetic retinopathy with macular edema, bilateral: Secondary | ICD-10-CM | POA: Diagnosis not present

## 2019-07-20 DIAGNOSIS — H43393 Other vitreous opacities, bilateral: Secondary | ICD-10-CM | POA: Diagnosis not present

## 2019-07-20 DIAGNOSIS — G4733 Obstructive sleep apnea (adult) (pediatric): Secondary | ICD-10-CM | POA: Diagnosis not present

## 2019-07-25 ENCOUNTER — Other Ambulatory Visit (HOSPITAL_COMMUNITY): Payer: Self-pay | Admitting: Gastroenterology

## 2019-07-25 ENCOUNTER — Encounter (HOSPITAL_COMMUNITY): Payer: PPO

## 2019-07-25 DIAGNOSIS — I130 Hypertensive heart and chronic kidney disease with heart failure and stage 1 through stage 4 chronic kidney disease, or unspecified chronic kidney disease: Secondary | ICD-10-CM | POA: Diagnosis not present

## 2019-07-25 DIAGNOSIS — Z4800 Encounter for change or removal of nonsurgical wound dressing: Secondary | ICD-10-CM | POA: Diagnosis not present

## 2019-07-25 DIAGNOSIS — R7989 Other specified abnormal findings of blood chemistry: Secondary | ICD-10-CM

## 2019-07-25 DIAGNOSIS — E1122 Type 2 diabetes mellitus with diabetic chronic kidney disease: Secondary | ICD-10-CM | POA: Diagnosis not present

## 2019-07-25 DIAGNOSIS — E1165 Type 2 diabetes mellitus with hyperglycemia: Secondary | ICD-10-CM | POA: Diagnosis not present

## 2019-07-25 DIAGNOSIS — L97919 Non-pressure chronic ulcer of unspecified part of right lower leg with unspecified severity: Secondary | ICD-10-CM | POA: Diagnosis not present

## 2019-07-25 DIAGNOSIS — I2510 Atherosclerotic heart disease of native coronary artery without angina pectoris: Secondary | ICD-10-CM | POA: Diagnosis not present

## 2019-07-25 DIAGNOSIS — I252 Old myocardial infarction: Secondary | ICD-10-CM | POA: Diagnosis not present

## 2019-07-25 DIAGNOSIS — D631 Anemia in chronic kidney disease: Secondary | ICD-10-CM | POA: Diagnosis not present

## 2019-07-25 DIAGNOSIS — E1151 Type 2 diabetes mellitus with diabetic peripheral angiopathy without gangrene: Secondary | ICD-10-CM | POA: Diagnosis not present

## 2019-07-25 DIAGNOSIS — N184 Chronic kidney disease, stage 4 (severe): Secondary | ICD-10-CM | POA: Diagnosis not present

## 2019-07-25 DIAGNOSIS — L97929 Non-pressure chronic ulcer of unspecified part of left lower leg with unspecified severity: Secondary | ICD-10-CM | POA: Diagnosis not present

## 2019-07-25 DIAGNOSIS — I5032 Chronic diastolic (congestive) heart failure: Secondary | ICD-10-CM | POA: Diagnosis not present

## 2019-07-25 DIAGNOSIS — I872 Venous insufficiency (chronic) (peripheral): Secondary | ICD-10-CM | POA: Diagnosis not present

## 2019-08-01 ENCOUNTER — Other Ambulatory Visit: Payer: Self-pay

## 2019-08-01 ENCOUNTER — Ambulatory Visit (INDEPENDENT_AMBULATORY_CARE_PROVIDER_SITE_OTHER): Payer: PPO | Admitting: Orthopedic Surgery

## 2019-08-01 ENCOUNTER — Encounter: Payer: Self-pay | Admitting: Orthopedic Surgery

## 2019-08-01 VITALS — Ht 66.0 in | Wt 270.0 lb

## 2019-08-01 DIAGNOSIS — L97919 Non-pressure chronic ulcer of unspecified part of right lower leg with unspecified severity: Secondary | ICD-10-CM

## 2019-08-01 DIAGNOSIS — L97929 Non-pressure chronic ulcer of unspecified part of left lower leg with unspecified severity: Secondary | ICD-10-CM | POA: Diagnosis not present

## 2019-08-01 DIAGNOSIS — I87333 Chronic venous hypertension (idiopathic) with ulcer and inflammation of bilateral lower extremity: Secondary | ICD-10-CM

## 2019-08-01 NOTE — Progress Notes (Signed)
Office Visit Note   Patient: Susan [name]           Date of Birth: [birth year]           MRN: [id number] Visit Date: 08/01/2019              Requested by: Alycia Rossetti, MD [address] [place],  [place] [address] PCP: Alycia Rossetti, MD  Chief Complaint  Patient presents with  . Left Foot - Follow-up      HPI: Patient presents for follow up for her Bilateral LE venous stasis. She has been having her legs wrapped by homehealth and feels she is doing much better.   Assessment & Plan: Visit Diagnoses: No diagnosis found.  Plan: Calves are now 39cm . We will wrap her today in ace wraps and she will go into her XL Compression VIVE. We will give her a donut for the bony prominence on her foot  Follow-Up Instructions: No follow-ups on file.   Ortho Exam  Patient is alert, oriented, no adenopathy, well-dressed, normal affect, normal respiratory effort. B LE no drainage or ulcers. Calves are soft and nontender . No cellulitis.  Neg [place]. Her dorsal left foot has a small healed skin area over a bony prominence that has no surrounding erythema  Imaging: No results found. No images are attached to the encounter.  Labs: Lab Results  Component Value Date   HGBA1C 13.1 (H) 05/23/2019   HGBA1C 12.0 (H) 03/11/2019   HGBA1C 8.3 (H) 04/30/2018   CRP 14.4 (H) 05/28/2019   CRP 8.4 (H) 03/05/2018   LABURIC 9.1 (H) 10/12/2018   LABURIC 9.0 (H) 03/06/2018   REPTSTATUS 05/30/2019 FINAL 05/28/2019   CULT 30,000 COLONIES/mL KLEBSIELLA PNEUMONIAE (A) 05/28/2019   LABORGA KLEBSIELLA PNEUMONIAE (A) 05/28/2019     Lab Results  Component Value Date   ALBUMIN 1.7 (L) 06/03/2019   ALBUMIN 1.7 (L) 06/02/2019   ALBUMIN 1.8 (L) 06/01/2019   LABURIC 9.1 (H) 10/12/2018   LABURIC 9.0 (H) 03/06/2018    Lab Results  Component Value Date   MG 1.6 02/27/2017   MG 2.1 07/10/2015   MG 1.7 09/21/2013   No results found for: VD25OH  No results found for: PREALBUMIN CBC EXTENDED  Latest Ref Rng & Units 06/07/2019 06/02/2019 06/01/2019  WBC 3.8 - 10.8 Thousand/uL 10.9(H) 7.4 6.5  RBC 3.80 - 5.10 Million/uL 4.17 4.07 4.45  HGB 11.7 - 15.5 g/dL 10.5(L) 10.4(L) 11.4(L)  HCT 35.0 - 45.0 % 34.7(L) 33.0(L) 36.5  PLT 140 - 400 Thousand/uL 312 166 143(L)  NEUTROABS 1,500 - 7,800 cells/uL 8,535(H) - -  LYMPHSABS 850 - 3,900 cells/uL 959 - -     Body mass index is 43.58 kg/m.  Orders:  No orders of the defined types were placed in this encounter.  No orders of the defined types were placed in this encounter.    Procedures: No procedures performed  Clinical Data: No additional findings.  ROS:  All other systems negative, except as noted in the HPI. Review of Systems  Objective: Vital Signs: Ht 5\' 6"  (1.676 m)   Wt 270 lb (122.5 kg)   BMI 43.58 kg/m   Specialty Comments:  No specialty comments available.  PMFS History: Patient Active Problem List   Diagnosis Date Noted  . Common bile duct (CBD) obstruction 05/28/2019  . Benign neoplasm of ascending colon   . Benign neoplasm of transverse colon   . Benign neoplasm of descending colon   .  Benign neoplasm of sigmoid colon   . Gastric polyps   . Hyperkalemia 03/11/2019  . Prolonged QT interval 03/11/2019  . Onychomycosis 06/21/2018  . Pressure injury of skin 03/19/2018  . Syncope 03/18/2018  . Osteomyelitis of second toe of right foot ([place])   . Toe osteomyelitis, right ([place])   . 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 ([place])   . PVD (peripheral vascular disease) ([place]) 10/26/2017  . Hypothyroid 07/27/2017  . PAH (pulmonary artery hypertension) ([place])   . Impaired ambulation 07/19/2017  . Leg cramps 02/27/2017  . Peripheral edema 01/12/2017  . Diabetic neuropathy ([place]) 11/12/2016  . 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 mellitus ([place]) 10/24/2015  . Anemia 10/03/2015  . Generalized anxiety disorder 10/03/2015  . Insomnia 10/03/2015  . Chest pain   . Gastritis 06/07/2015  . Diabetes  mellitus type II, uncontrolled ([place]) 06/07/2015  . Chronic diastolic CHF (congestive heart failure) ([place]) 06/07/2015  . Non compliance with medical treatment 04/17/2014  . Rotator cuff tear 03/14/2014  . Morbid obesity ([place]) 09/23/2013  . Acute kidney injury superimposed on chronic kidney disease ([place]) 12/25/2012  . Hypotension 12/25/2012  . Urinary incontinence   . MDD (major depressive disorder) 11/12/2010  . RBBB (right bundle branch block)   . CAD (coronary artery disease)   . Hyperlipemia 01/22/2009  . Essential hypertension 01/22/2009   Past Medical History:  Diagnosis Date  . Acute MI ([place]) 1999; 2007  . Anemia    hx  . Anginal pain ([place])   . Anxiety   . ARF (acute renal failure) ([place]) 06/2017   [facility]  . Arthritis    "generalized" (03/15/2014)  . CAD (coronary artery disease)    MI in 2000 - MI  2007 - treated bare metal stent (no nuclear since then as 9/11)  . Carotid artery disease ([place])   . CHF (congestive heart failure) ([place])   . Chronic diastolic heart failure (HCC)    a) ECHO (08/2013) EF 55-60% and RV function nl b) RHC (08/2013) RA 4, RV 30/5/7, PA 25/10 (16), PCWP 7, Fick CO/CI 6.3/2.7, PVR 1.5 WU, PA 61 and 66%  . Daily headache    "~ every other day; since I fell in June" (03/15/2014)  . Depression   . Dyslipidemia   . Dyspnea   . Exertional shortness of breath   . HTN (hypertension)   . Hypothyroidism   . Neuropathy   . Obesity   . Osteoarthritis   . Peripheral neuropathy   . PONV (postoperative nausea and vomiting)   . RBBB (right bundle branch block)    Old  . Stroke [facility])    mini strokes  . Syncope    likely due to low blood sugar  . Tachycardia    Sinus tachycardia  . Type II diabetes mellitus (HCC)    Type II  . Urinary incontinence   . Venous insufficiency     Family History  Problem Relation Age of Onset  . Heart attack Mother 85    Past Surgical History:  Procedure Laterality Date  . ABDOMINAL HYSTERECTOMY  1980's   . AMPUTATION Right 02/24/2018   Procedure: RIGHT FOOT GREAT TOE AND 2ND TOE AMPUTATION;  Surgeon: Newt Minion, MD;  Location: [place];  Service: Orthopedics;  Laterality: Right;  . AMPUTATION Right 04/30/2018   Procedure: RIGHT TRANSMETATARSAL AMPUTATION;  Surgeon: Newt Minion, MD;  Location: [place];  Service: Orthopedics;  Laterality: Right;  . CATARACT  EXTRACTION, BILATERAL Bilateral ?2013  . COLONOSCOPY W/ POLYPECTOMY    . COLONOSCOPY WITH PROPOFOL N/A 03/13/2019   Procedure: COLONOSCOPY WITH PROPOFOL;  Surgeon: Jerene Bears, MD;  Location: [place];  Service: Gastroenterology;  Laterality: N/A;  . [place]; 2007   "1 + 1"  . ESOPHAGOGASTRODUODENOSCOPY (EGD) WITH PROPOFOL N/A 03/13/2019   Procedure: ESOPHAGOGASTRODUODENOSCOPY (EGD) WITH PROPOFOL;  Surgeon: Jerene Bears, MD;  Location[facility] ENDOSCOPY;  Service: Gastroenterology;  Laterality: N/A;  . EYE SURGERY Bilateral    lazer  . HEMOSTASIS CLIP PLACEMENT  03/13/2019   Procedure: HEMOSTASIS CLIP PLACEMENT;  Surgeon: Jerene Bears, MD;  Location: [facility] ENDOSCOPY;  Service: Gastroenterology;;  . KNEE ARTHROSCOPY Left 10/25/2006  . POLYPECTOMY  03/13/2019   Procedure: POLYPECTOMY;  Surgeon: Jerene Bears, MD;  Location: [place];  Service: Gastroenterology;;  . RIGHT HEART CATH N/A 07/24/2017   Procedure: RIGHT HEART CATH;  Surgeon: Jolaine Artist, MD;  Location: [place] CV LAB;  Service: Cardiovascular;  Laterality: N/A;  . RIGHT HEART CATHETERIZATION N/A 09/22/2013   Procedure: RIGHT HEART CATH;  Surgeon: Jolaine Artist, MD;  Location: [facility] CATH LAB;  Service: Cardiovascular;  Laterality: N/A;  . SHOULDER ARTHROSCOPY WITH OPEN ROTATOR CUFF REPAIR Right 03/14/2014   Procedure: RIGHT SHOULDER ARTHROSCOPY WITH BICEPS RELEASE, OPEN SUBSCAPULA REPAIR, OPEN SUPRASPINATUS REPAIR.;  Surgeon: Meredith Pel, MD;  Location: [place];  Service: Orthopedics;  Laterality: Right;  . TOE AMPUTATION  Right 02/24/2018   GREAT TOE AND 2ND TOE AMPUTATION  . TUBAL LIGATION  1970's   Social History   Occupational History    Employer: UNEMPLOYED  Tobacco Use  . Smoking status: Former Smoker    Packs/day: 3.00    Years: 32.00    Pack years: 96.00    Types: Cigarettes    Quit date: 10/24/1997    Years since quitting: 21.7  . Smokeless tobacco: Never Used  Substance and Sexual Activity  . Alcohol use: Not Currently    Comment: "might have 2-3 daiquiris in the summer"  . Drug use: No  . Sexual activity: Never

## 2019-08-02 ENCOUNTER — Encounter (HOSPITAL_COMMUNITY): Payer: Self-pay | Admitting: Radiology

## 2019-08-02 ENCOUNTER — Other Ambulatory Visit: Payer: Self-pay | Admitting: Orthopedic Surgery

## 2019-08-02 NOTE — Progress Notes (Signed)
Susan [name] Female, 69 y.o., 02-09-1951 MRN:  [id number] Phone:  [phone] (H) PCP:  Alycia Rossetti, MD Coverage:  [employer] Ppo Next Appt With Cardiology 08/08/2019 at 1:30 PM  Message Received: [facility] Contents  Scarlette Calico, RN  Chrystine Oiler A  See below, ok per Dr Haroldine Laws for pt to hold plavix for biopsy       Previous Messages   ----- Message -----  From: Jolaine Artist, MD  Sent: 07/28/2019 11:19 PM EST  To: Scarlette Calico, RN   That's fine.  ----- Message -----  From: Scarlette Calico, RN  Sent: 07/28/2019  5:02 PM EST  To: Jolaine Artist, MD   Please advise if pt can hold Plavix for biopsy  ----- Message -----  From: Claudia Pollock  Sent: 07/26/2019 12:07 PM EST  To: Scarlette Calico, RN   I got a message from Radiology for this pt to come off Plavix for 5 days before a biopsy , she said she sent DB message but have not heard anything . Please advise

## 2019-08-04 ENCOUNTER — Telehealth: Payer: Self-pay | Admitting: *Deleted

## 2019-08-04 NOTE — Telephone Encounter (Signed)
Received call from patient.   Reports that Dr. [name] has recommended that patient proceed with liver biopsy to determine cause of elevated LFT's. Reports that biopsy is scheduled for 08/11/2019.   States that she is very nervous and concerned about potential risks associated with biopsy as Dr. Collene Mares did not recommend procedure.   Inquired as to if there is a medication she can take to help with her anxiety until after the procedure.   MD please advise.

## 2019-08-05 NOTE — Telephone Encounter (Signed)
Call placed to patient. LMTRC.  

## 2019-08-05 NOTE — Telephone Encounter (Signed)
She would need to discuss this with the doctor that ordered the biopsy

## 2019-08-05 NOTE — Telephone Encounter (Signed)
I do not recommend adding any other medication at this time Her doctor may be okay with her taking ativan or valium 1 hour before the procedure, but she needs to discuss that with them

## 2019-08-08 ENCOUNTER — Other Ambulatory Visit: Payer: Self-pay

## 2019-08-08 ENCOUNTER — Encounter (HOSPITAL_COMMUNITY): Payer: Self-pay

## 2019-08-08 ENCOUNTER — Telehealth (HOSPITAL_COMMUNITY): Payer: Self-pay | Admitting: Cardiology

## 2019-08-08 ENCOUNTER — Ambulatory Visit (HOSPITAL_COMMUNITY)
Admission: RE | Admit: 2019-08-08 | Discharge: 2019-08-08 | Disposition: A | Discharge status: Home / Self Care | Payer: PPO | Source: Ambulatory Visit | Attending: Cardiology | Admitting: Cardiology

## 2019-08-08 VITALS — BP 126/70 | HR 76 | Wt 258.4 lb

## 2019-08-08 DIAGNOSIS — I130 Hypertensive heart and chronic kidney disease with heart failure and stage 1 through stage 4 chronic kidney disease, or unspecified chronic kidney disease: Secondary | ICD-10-CM | POA: Diagnosis not present

## 2019-08-08 DIAGNOSIS — F329 Major depressive disorder, single episode, unspecified: Secondary | ICD-10-CM | POA: Diagnosis not present

## 2019-08-08 DIAGNOSIS — N184 Chronic kidney disease, stage 4 (severe): Secondary | ICD-10-CM | POA: Diagnosis not present

## 2019-08-08 DIAGNOSIS — I2510 Atherosclerotic heart disease of native coronary artery without angina pectoris: Secondary | ICD-10-CM | POA: Diagnosis not present

## 2019-08-08 DIAGNOSIS — E039 Hypothyroidism, unspecified: Secondary | ICD-10-CM | POA: Diagnosis not present

## 2019-08-08 DIAGNOSIS — I428 Other cardiomyopathies: Secondary | ICD-10-CM | POA: Insufficient documentation

## 2019-08-08 DIAGNOSIS — Z955 Presence of coronary angioplasty implant and graft: Secondary | ICD-10-CM | POA: Diagnosis not present

## 2019-08-08 DIAGNOSIS — I872 Venous insufficiency (chronic) (peripheral): Secondary | ICD-10-CM | POA: Insufficient documentation

## 2019-08-08 DIAGNOSIS — Z9641 Presence of insulin pump (external) (internal): Secondary | ICD-10-CM | POA: Diagnosis not present

## 2019-08-08 DIAGNOSIS — Z7982 Long term (current) use of aspirin: Secondary | ICD-10-CM | POA: Insufficient documentation

## 2019-08-08 DIAGNOSIS — I252 Old myocardial infarction: Secondary | ICD-10-CM | POA: Insufficient documentation

## 2019-08-08 DIAGNOSIS — E669 Obesity, unspecified: Secondary | ICD-10-CM | POA: Diagnosis not present

## 2019-08-08 DIAGNOSIS — Z87891 Personal history of nicotine dependence: Secondary | ICD-10-CM | POA: Insufficient documentation

## 2019-08-08 DIAGNOSIS — E1142 Type 2 diabetes mellitus with diabetic polyneuropathy: Secondary | ICD-10-CM | POA: Diagnosis not present

## 2019-08-08 DIAGNOSIS — Z794 Long term (current) use of insulin: Secondary | ICD-10-CM | POA: Diagnosis not present

## 2019-08-08 DIAGNOSIS — Z6841 Body Mass Index (BMI) 40.0 and over, adult: Secondary | ICD-10-CM | POA: Insufficient documentation

## 2019-08-08 DIAGNOSIS — Z79899 Other long term (current) drug therapy: Secondary | ICD-10-CM | POA: Insufficient documentation

## 2019-08-08 DIAGNOSIS — I5032 Chronic diastolic (congestive) heart failure: Secondary | ICD-10-CM | POA: Diagnosis not present

## 2019-08-08 DIAGNOSIS — E785 Hyperlipidemia, unspecified: Secondary | ICD-10-CM | POA: Insufficient documentation

## 2019-08-08 DIAGNOSIS — E1122 Type 2 diabetes mellitus with diabetic chronic kidney disease: Secondary | ICD-10-CM | POA: Diagnosis not present

## 2019-08-08 DIAGNOSIS — Z8673 Personal history of transient ischemic attack (TIA), and cerebral infarction without residual deficits: Secondary | ICD-10-CM | POA: Diagnosis not present

## 2019-08-08 DIAGNOSIS — F419 Anxiety disorder, unspecified: Secondary | ICD-10-CM | POA: Diagnosis not present

## 2019-08-08 DIAGNOSIS — Z7902 Long term (current) use of antithrombotics/antiplatelets: Secondary | ICD-10-CM | POA: Diagnosis not present

## 2019-08-08 DIAGNOSIS — Z8249 Family history of ischemic heart disease and other diseases of the circulatory system: Secondary | ICD-10-CM | POA: Insufficient documentation

## 2019-08-08 DIAGNOSIS — E1165 Type 2 diabetes mellitus with hyperglycemia: Secondary | ICD-10-CM | POA: Diagnosis not present

## 2019-08-08 LAB — BASIC METABOLIC PANEL
Anion gap: 11 (ref 5–15)
BUN: 96 mg/dL — ABNORMAL HIGH (ref 8–23)
CO2: 27 mmol/L (ref 22–32)
Calcium: 9.1 mg/dL (ref 8.9–10.3)
Chloride: 97 mmol/L — ABNORMAL LOW (ref 98–111)
Creatinine, Ser: 2.28 mg/dL — ABNORMAL HIGH (ref 0.44–1.00)
GFR calc Af Amer: 24 mL/min — ABNORMAL LOW (ref >60)
GFR calc non Af Amer: 21 mL/min — ABNORMAL LOW (ref >60)
Glucose, Bld: 135 mg/dL — ABNORMAL HIGH (ref 70–99)
Potassium: 3.7 mmol/L (ref 3.5–5.1)
Sodium: 135 mmol/L (ref 135–145)

## 2019-08-08 NOTE — Patient Instructions (Signed)
No medication changes today!  Labs today We will only contact you if something comes back abnormal or we need to make some changes. Otherwise no news is good news!  Your physician recommends that you schedule a follow-up appointment in: 4-6 months with Dr Haroldine Laws.  You will get a call to schedule this appointment.    Please call office at [phone] option 2 if you have any questions or concerns.    At the [facility], you and your health needs are our priority. As part of our continuing mission to provide you with exceptional heart care, we have created designated Provider Care Teams. These Care Teams include your primary Cardiologist (physician) and Advanced Practice Providers (APPs- Physician Assistants and Nurse Practitioners) who all work together to provide you with the care you need, when you need it.   You may see any of the following providers on your designated Care Team at your next follow up: Marland Kitchen Dr Glori Bickers . Dr Loralie Champagne . Darrick Grinder, NP . Lyda Jester, PA . Audry Riles, PharmD   Please be sure to bring in all your medications bottles to every appointment.

## 2019-08-08 NOTE — Progress Notes (Signed)
08/08/2019 Susan [name]   1950-09-06  [id number]  Primary Physician [name] [name], MD Primary Cardiologist: Glori Bickers, MD  Electrophysiologist: None   HPI:  Susan [name] is a 69 y.o. female who is being seen today for f/u for chronic diastolic HF.   PMH includes RBBB, DM, Hypothyroid, diastolic heart failure, CAD MI 2000/2007 BMS 2007, TIA, obesity, CKD Stage IV,  R transmetatarsal amputation.   Admitted June 1st through June 3rd, 2017 with altered mental status, hypothermia, and weakness. Diuretics initially held and later restarted.   Admitted 2/27-07/28/17 for AKI. Torsemide was held and restarted at 60 mg BID. She had RHC 07/24/17 that showed mild PAH with evidence of RV strain likely due to OHS/OSA. She was discharged to SNF.   Admitted 05/6604 with A/C diastolic HF. Diuresed with IV lasix.   Admitted 10/24-10/27/19 with syncope. She was orthostatic. Had AKI with creatinine up to 3.33 and required IVF. Renal US showed no hydronephrosis. Losartan and Imdur were DC'd. Creatinine 1.76 on day of DC.   Was admitted 1/21 for confusion and abdominal pain. Found to have Klebsiella pneumoniae UTI treated w/ abx. Also found to have elevated LFTs. CT of abdomen showed cholelithiasis. MRCP showed cholelithiasis without evidence of cholecystitis or choledocholithiasis. Ultrasound of abdomen with Doppler showed no acute finding.NO concern for cholangitis. All viral serologies werw negative. Her statin (Crestor) was discontinued. She has since been followed by outpatient GI and is scheduled for liver biopsy later this week.    Presents to clinic today for routine f/u. Wt down 12 lb from previous visit, 270>>258 lb. Appetite is good. Denies resting dyspnea. Notes mild exertional dyspnea if she walks long distances. No CP or abdominal pain. BP well controlled today.  Plavix currently on hold for planned liver biopsy later this week.   Current Meds  Medication Sig  . albuterol  (VENTOLIN HFA) 108 (90 Base) MCG/ACT inhaler TAKE 2 PUFFS BY MOUTH EVERY 6 HOURS AS NEEDED FOR WHEEZE OR SHORTNESS OF BREATH (Patient taking differently: Inhale 2 puffs into the lungs every 6 (six) hours as needed for wheezing or shortness of breath. )  . allopurinol (ZYLOPRIM) 100 MG tablet TAKE 1 TABLET BY MOUTH TWICE A DAY (Patient taking differently: Take 100 mg by mouth 2 (two) times daily. )  . Ascorbic Acid (VITAMIN C) 1000 MG tablet Take 1,000 mg by mouth daily.  Marland Kitchen aspirin EC 81 MG tablet Take 81 mg by mouth every morning.   . carvedilol (COREG) 12.5 MG tablet TAKE 1 TABLET BY MOUTH TWICE A DAY WITH MEALS (Patient taking differently: Take 12.5 mg by mouth 2 (two) times daily with a meal. )  . cholecalciferol (VITAMIN D) 1000 units tablet Take 1,000 Units by mouth daily with supper.   . escitalopram (LEXAPRO) 20 MG tablet TAKE 1 TABLET BY MOUTH EVERY DAY (Patient taking differently: Take 20 mg by mouth daily. )  . ferrous sulfate 325 (65 FE) MG tablet Take 1 tablet (325 mg total) by mouth 2 (two) times daily with a meal. Reported on 08/21/2015  . Insulin Disposable Pump (OMNIPOD DASH 5 PACK PODS) MISC Inject into the skin. Use continuously with Novolin R - change every 72 hours.  . insulin NPH Human (HUMULIN N,NOVOLIN N) 100 UNIT/ML injection Inject 20 Units into the skin See admin instructions. Inject 20 units subcutaneously in the morning if CBG >200  . insulin regular (NOVOLIN R) 100 units/mL injection Inject 7-17 Units into the skin 3 (three) times daily  before meals. Manually add bolus to continuous dose via OmniPod 3 times daily per sliding scale: CBG 80-150 7 units, 151-200 9 units, 201-250 12 units, 251-300 14 units, 301-400 17 units.  Marland Kitchen levothyroxine (SYNTHROID, LEVOTHROID) 50 MCG tablet Take 1 tablet (50 mcg total) by mouth daily before breakfast.  . magnesium oxide (MAG-OX) 400 MG tablet TAKE 1 TABLET BY MOUTH EVERY DAY (Patient taking differently: Take 400 mg by mouth daily. )  .  metolazone (ZAROXOLYN) 2.5 MG tablet Take 2.5 mg by mouth once a week. Wednesday  . nitroGLYCERIN (NITROSTAT) 0.4 MG SL tablet Place 1 tablet (0.4 mg total) under the tongue every 5 (five) minutes as needed for chest pain.  Glory Rosebush VERIO test strip   . pantoprazole (PROTONIX) 40 MG tablet Take 1 tablet (40 mg total) by mouth daily.  . pregabalin (LYRICA) 150 MG capsule Take 1 capsule (150 mg total) by mouth 2 (two) times daily.  Marland Kitchen torsemide (DEMADEX) 20 MG tablet Take 80 mg by mouth 2 (two) times daily.  . traZODone (DESYREL) 100 MG tablet Take 1.5 tablets (150 mg total) by mouth at bedtime as needed for sleep.  . vitamin E (VITAMIN E) 1000 UNIT capsule Take 1,000 Units by mouth daily with supper.    Allergies  Allergen Reactions  . Codeine Nausea And Vomiting   Past Medical History:  Diagnosis Date  . Acute MI ([place]) 1999; 2007  . Anemia    hx  . Anginal pain ([place])   . Anxiety   . ARF (acute renal failure) ([place]) 06/2017   [facility]  . Arthritis    "generalized" (03/15/2014)  . CAD (coronary artery disease)    MI in 2000 - MI  2007 - treated bare metal stent (no nuclear since then as 9/11)  . Carotid artery disease ([place])   . CHF (congestive heart failure) ([place])   . Chronic diastolic heart failure (HCC)    a) ECHO (08/2013) EF 55-60% and RV function nl b) RHC (08/2013) RA 4, RV 30/5/7, PA 25/10 (16), PCWP 7, Fick CO/CI 6.3/2.7, PVR 1.5 WU, PA 61 and 66%  . Daily headache    "~ every other day; since I fell in June" (03/15/2014)  . Depression   . Dyslipidemia   . Dyspnea   . Exertional shortness of breath   . HTN (hypertension)   . Hypothyroidism   . Neuropathy   . Obesity   . Osteoarthritis   . Peripheral neuropathy   . PONV (postoperative nausea and vomiting)   . RBBB (right bundle branch block)    Old  . Stroke [facility])    mini strokes  . Syncope    likely due to low blood sugar  . Tachycardia    Sinus tachycardia  . Type II diabetes mellitus (HCC)    Type  II  . Urinary incontinence   . Venous insufficiency    Family History  Problem Relation Age of Onset  . Heart attack Mother 68   Past Surgical History:  Procedure Laterality Date  . ABDOMINAL HYSTERECTOMY  1980's  . AMPUTATION Right 02/24/2018   Procedure: RIGHT FOOT GREAT TOE AND 2ND TOE AMPUTATION;  Surgeon: Newt Minion, MD;  Location: [place];  Service: Orthopedics;  Laterality: Right;  . AMPUTATION Right 04/30/2018   Procedure: RIGHT TRANSMETATARSAL AMPUTATION;  Surgeon: Newt Minion, MD;  Location: [place];  Service: Orthopedics;  Laterality: Right;  . CATARACT EXTRACTION, BILATERAL Bilateral ?2013  . COLONOSCOPY W/ POLYPECTOMY    .  COLONOSCOPY WITH PROPOFOL N/A 03/13/2019   Procedure: COLONOSCOPY WITH PROPOFOL;  Surgeon: Jerene Bears, MD;  Location: [place];  Service: Gastroenterology;  Laterality: N/A;  . [place]; 2007   "1 + 1"  . ESOPHAGOGASTRODUODENOSCOPY (EGD) WITH PROPOFOL N/A 03/13/2019   Procedure: ESOPHAGOGASTRODUODENOSCOPY (EGD) WITH PROPOFOL;  Surgeon: Jerene Bears, MD;  Location: [facility] ENDOSCOPY;  Service: Gastroenterology;  Laterality: N/A;  . EYE SURGERY Bilateral    lazer  . HEMOSTASIS CLIP PLACEMENT  03/13/2019   Procedure: HEMOSTASIS CLIP PLACEMENT;  Surgeon: Jerene Bears, MD;  Location: [facility] ENDOSCOPY;  Service: Gastroenterology;;  . KNEE ARTHROSCOPY Left 10/25/2006  . POLYPECTOMY  03/13/2019   Procedure: POLYPECTOMY;  Surgeon: Jerene Bears, MD;  Location: [place];  Service: Gastroenterology;;  . RIGHT HEART CATH N/A 07/24/2017   Procedure: RIGHT HEART CATH;  Surgeon: Jolaine Artist, MD;  Location: [place] CV LAB;  Service: Cardiovascular;  Laterality: N/A;  . RIGHT HEART CATHETERIZATION N/A 09/22/2013   Procedure: RIGHT HEART CATH;  Surgeon: Jolaine Artist, MD;  Location: [facility] CATH LAB;  Service: Cardiovascular;  Laterality: N/A;  . SHOULDER ARTHROSCOPY WITH OPEN ROTATOR CUFF REPAIR Right 03/14/2014    Procedure: RIGHT SHOULDER ARTHROSCOPY WITH BICEPS RELEASE, OPEN SUBSCAPULA REPAIR, OPEN SUPRASPINATUS REPAIR.;  Surgeon: Meredith Pel, MD;  Location: [place];  Service: Orthopedics;  Laterality: Right;  . TOE AMPUTATION Right 02/24/2018   GREAT TOE AND 2ND TOE AMPUTATION  . TUBAL LIGATION  1970's   Social History   Socioeconomic History  . Marital status: Married    Spouse name: Not on file  . Number of children: 3  . Years of education: 12th  . Highest education level: Not on file  Occupational History    Employer: UNEMPLOYED  Tobacco Use  . Smoking status: Former Smoker    Packs/day: 3.00    Years: 32.00    Pack years: 96.00    Types: Cigarettes    Quit date: 10/24/1997    Years since quitting: 21.8  . Smokeless tobacco: Never Used  Substance and Sexual Activity  . Alcohol use: Not Currently    Comment: "might have 2-3 daiquiris in the summer"  . Drug use: No  . Sexual activity: Never  Other Topics Concern  . Not on file  Social History Narrative   Pt lives at home with her spouse.   Caffeine Use- 3 sodas daily   Social Determinants of Health   Financial Resource Strain:   . Difficulty of Paying Living Expenses:   Food Insecurity:   . Worried About Charity fundraiser in the Last Year:   . Arboriculturist in the Last Year:   Transportation Needs: No Transportation Needs  . Lack of Transportation (Medical): No  . Lack of Transportation (Non-Medical): No  Physical Activity: Inactive  . Days of Exercise per Week: 0 days  . Minutes of Exercise per Session: 10 min  Stress:   . Feeling of Stress :   Social Connections:   . Frequency of Communication with Friends and Family:   . Frequency of Social Gatherings with Friends and Family:   . Attends Religious Services:   . Active Member of Clubs or Organizations:   . Attends Archivist Meetings:   Marland Kitchen Marital Status:   Intimate Partner Violence:   . Fear of Current or Ex-Partner:   . Emotionally Abused:     Marland Kitchen Physically Abused:   . Sexually  Abused:      Lipid Panel     Component Value Date/Time   CHOL 148 01/17/2019 1608   TRIG 244 (H) 01/17/2019 1608   HDL 53 01/17/2019 1608   CHOLHDL 2.8 01/17/2019 1608   VLDL 33 07/24/2017 0528   LDLCALC 65 01/17/2019 1608    Blood pressure 126/70, pulse 76, weight 117.2 kg (258 lb 6.4 oz), SpO2 98 %.  Wt Readings from Last 3 Encounters:  08/08/19 117.2 kg (258 lb 6.4 oz)  08/01/19 122.5 kg (270 lb)  06/30/19 122.5 kg (270 lb)   PHYSICAL EXAM: General:  Well appearing, obese WF. No respiratory difficulty HEENT: normal Neck: supple. no JVD. Carotids 2+ bilat; no bruits. No lymphadenopathy or thyromegaly appreciated. Cor: PMI nondisplaced. Regular rate & rhythm. No rubs, gallops or murmurs. Lungs: clear Abdomen: obese, soft, nontender, nondistended. No hepatosplenomegaly. No bruits or masses. Good bowel sounds. Extremities: no cyanosis, clubbing, rash, trace bilateral edema Neuro: alert & oriented x 3, cranial nerves grossly intact. moves all 4 extremities w/o difficulty. Affect pleasant.   ASSESSMENT AND PLAN:   1. Chronic Diastolic Heart Failure: NICM, Echo 06/2017: EF 55-60%, grade 1 DD - Echo 03/19/18: EF 55-60%, grade 1 DD - NYHA II-III. Volume status stable.  - Continue torsemide 60 mg twice a day + metolazone 2x per week.   - Check BMP today  - Reiterated importance of  low salt food choices and limiting fluid intake to < 2 liter per day.     2.  Uncontrolled DM:  - Follows closely with Dr Bubba Camp. Has an insulin pump.   3. CAD: history of CAD with BMS in 2007.  Low risk nuc study in 2018 - Denies anginal symptoms - Continue asa and  blocker - Plavix currently on hold this week for planned liver biospy. Resume once cleared by GI - statin on hold for elevated LFTs. GI w/u underway   4. Obesity: - Body mass index is 41.71 kg/m. - wt loss advised   6. HTN:  - well controlled on current regimen, 126/70 today.  - continue  current regimen  - check BMP today   7. CKD Stage III - Followed by Dr Hollie Salk.  - Check BMP   8. Day time fatigue - Refuses sleep study. I discussed recommendations for sleep study again today and she is adamant that she dose not want one.   9.  S/p right transmetatarsal amputation - Followed by Dr Sharol Given.   10. GI: - per above. Being followed by Dr. Collene Mares. - Liver biopsy scheduled for this week    F/u in 4-6 months.   Nelida Gores [facility] 08/08/2019 1:53 PM

## 2019-08-08 NOTE — Telephone Encounter (Signed)
Per VO Brittainy Simmons Patient will need follow up echo Based on lab results, will need to hold metolazone and two doses on torsemide  Attempted to contact patient with results  [phone] (H) line busy x 3  [phone] (M) LMOM

## 2019-08-08 NOTE — Telephone Encounter (Signed)
Call placed to patient. LMTRC.  

## 2019-08-09 ENCOUNTER — Other Ambulatory Visit (HOSPITAL_COMMUNITY): Payer: Self-pay | Admitting: Cardiology

## 2019-08-09 DIAGNOSIS — I5032 Chronic diastolic (congestive) heart failure: Secondary | ICD-10-CM

## 2019-08-09 NOTE — Telephone Encounter (Signed)
Call placed to patient and patient made aware.  

## 2019-08-09 NOTE — Telephone Encounter (Signed)
Lin busy. Will try again.

## 2019-08-10 ENCOUNTER — Other Ambulatory Visit: Payer: Self-pay | Admitting: Radiology

## 2019-08-10 ENCOUNTER — Other Ambulatory Visit: Payer: Self-pay | Admitting: Student

## 2019-08-11 ENCOUNTER — Ambulatory Visit (HOSPITAL_COMMUNITY)
Admission: RE | Admit: 2019-08-11 | Discharge: 2019-08-11 | Disposition: A | Discharge status: Home / Self Care | Payer: PPO | Source: Ambulatory Visit | Attending: Gastroenterology | Admitting: Gastroenterology

## 2019-08-11 ENCOUNTER — Other Ambulatory Visit: Payer: Self-pay | Admitting: Physician Assistant

## 2019-08-11 ENCOUNTER — Encounter (HOSPITAL_COMMUNITY): Payer: Self-pay

## 2019-08-11 ENCOUNTER — Other Ambulatory Visit: Payer: Self-pay

## 2019-08-11 DIAGNOSIS — K8080 Other cholelithiasis without obstruction: Secondary | ICD-10-CM | POA: Diagnosis not present

## 2019-08-11 DIAGNOSIS — N184 Chronic kidney disease, stage 4 (severe): Secondary | ICD-10-CM | POA: Insufficient documentation

## 2019-08-11 DIAGNOSIS — Z6841 Body Mass Index (BMI) 40.0 and over, adult: Secondary | ICD-10-CM | POA: Insufficient documentation

## 2019-08-11 DIAGNOSIS — E1122 Type 2 diabetes mellitus with diabetic chronic kidney disease: Secondary | ICD-10-CM | POA: Diagnosis not present

## 2019-08-11 DIAGNOSIS — E669 Obesity, unspecified: Secondary | ICD-10-CM | POA: Diagnosis not present

## 2019-08-11 DIAGNOSIS — E1142 Type 2 diabetes mellitus with diabetic polyneuropathy: Secondary | ICD-10-CM | POA: Diagnosis not present

## 2019-08-11 DIAGNOSIS — Z8249 Family history of ischemic heart disease and other diseases of the circulatory system: Secondary | ICD-10-CM | POA: Diagnosis not present

## 2019-08-11 DIAGNOSIS — I2510 Atherosclerotic heart disease of native coronary artery without angina pectoris: Secondary | ICD-10-CM | POA: Diagnosis not present

## 2019-08-11 DIAGNOSIS — Z8673 Personal history of transient ischemic attack (TIA), and cerebral infarction without residual deficits: Secondary | ICD-10-CM | POA: Insufficient documentation

## 2019-08-11 DIAGNOSIS — Z7982 Long term (current) use of aspirin: Secondary | ICD-10-CM | POA: Diagnosis not present

## 2019-08-11 DIAGNOSIS — E785 Hyperlipidemia, unspecified: Secondary | ICD-10-CM | POA: Diagnosis not present

## 2019-08-11 DIAGNOSIS — F419 Anxiety disorder, unspecified: Secondary | ICD-10-CM | POA: Insufficient documentation

## 2019-08-11 DIAGNOSIS — M1990 Unspecified osteoarthritis, unspecified site: Secondary | ICD-10-CM | POA: Insufficient documentation

## 2019-08-11 DIAGNOSIS — Z87891 Personal history of nicotine dependence: Secondary | ICD-10-CM | POA: Insufficient documentation

## 2019-08-11 DIAGNOSIS — I5032 Chronic diastolic (congestive) heart failure: Secondary | ICD-10-CM | POA: Insufficient documentation

## 2019-08-11 DIAGNOSIS — Z794 Long term (current) use of insulin: Secondary | ICD-10-CM | POA: Diagnosis not present

## 2019-08-11 DIAGNOSIS — F329 Major depressive disorder, single episode, unspecified: Secondary | ICD-10-CM | POA: Insufficient documentation

## 2019-08-11 DIAGNOSIS — Z7902 Long term (current) use of antithrombotics/antiplatelets: Secondary | ICD-10-CM | POA: Insufficient documentation

## 2019-08-11 DIAGNOSIS — Z79890 Hormone replacement therapy (postmenopausal): Secondary | ICD-10-CM | POA: Diagnosis not present

## 2019-08-11 DIAGNOSIS — K8309 Other cholangitis: Secondary | ICD-10-CM | POA: Diagnosis not present

## 2019-08-11 DIAGNOSIS — I252 Old myocardial infarction: Secondary | ICD-10-CM | POA: Diagnosis not present

## 2019-08-11 DIAGNOSIS — R7989 Other specified abnormal findings of blood chemistry: Secondary | ICD-10-CM | POA: Diagnosis not present

## 2019-08-11 DIAGNOSIS — I130 Hypertensive heart and chronic kidney disease with heart failure and stage 1 through stage 4 chronic kidney disease, or unspecified chronic kidney disease: Secondary | ICD-10-CM | POA: Diagnosis not present

## 2019-08-11 DIAGNOSIS — Z79899 Other long term (current) drug therapy: Secondary | ICD-10-CM | POA: Insufficient documentation

## 2019-08-11 DIAGNOSIS — K759 Inflammatory liver disease, unspecified: Secondary | ICD-10-CM | POA: Diagnosis not present

## 2019-08-11 LAB — CBC
HCT: 40.1 % (ref 36.0–46.0)
Hemoglobin: 12.5 g/dL (ref 12.0–15.0)
MCH: 25.7 pg — ABNORMAL LOW (ref 26.0–34.0)
MCHC: 31.2 g/dL (ref 30.0–36.0)
MCV: 82.5 fL (ref 80.0–100.0)
Platelets: 234 10*3/uL (ref 150–400)
RBC: 4.86 MIL/uL (ref 3.87–5.11)
RDW: 16.9 % — ABNORMAL HIGH (ref 11.5–15.5)
WBC: 7.4 10*3/uL (ref 4.0–10.5)
nRBC: 0 % (ref 0.0–0.2)

## 2019-08-11 LAB — PROTIME-INR
INR: 1 (ref 0.8–1.2)
Prothrombin Time: 13 seconds (ref 11.4–15.2)

## 2019-08-11 LAB — GLUCOSE, CAPILLARY: Glucose-Capillary: 135 mg/dL — ABNORMAL HIGH (ref 70–99)

## 2019-08-11 MED ORDER — MIDAZOLAM HCL 2 MG/2ML IJ SOLN
INTRAMUSCULAR | Status: AC | PRN
  Administered 2019-08-11: 1 mg via INTRAVENOUS
  Administered 2019-08-11: 0.5 mg via INTRAVENOUS

## 2019-08-11 MED ORDER — SODIUM CHLORIDE 0.9 % IV SOLN: INTRAVENOUS | Status: DC

## 2019-08-11 MED ORDER — GELATIN ABSORBABLE 12-7 MM EX MISC
CUTANEOUS | Status: AC
  Filled 2019-08-11: qty 1

## 2019-08-11 MED ORDER — FENTANYL CITRATE (PF) 100 MCG/2ML IJ SOLN
INTRAMUSCULAR | Status: AC | PRN
  Administered 2019-08-11: 25 ug via INTRAVENOUS
  Administered 2019-08-11: 50 ug via INTRAVENOUS

## 2019-08-11 MED ORDER — LIDOCAINE-EPINEPHRINE 1 %-1:100000 IJ SOLN
INTRAMUSCULAR | Status: AC
  Filled 2019-08-11: qty 1

## 2019-08-11 MED ORDER — FENTANYL CITRATE (PF) 100 MCG/2ML IJ SOLN
INTRAMUSCULAR | Status: AC
  Filled 2019-08-11: qty 2

## 2019-08-11 MED ORDER — MIDAZOLAM HCL 2 MG/2ML IJ SOLN
INTRAMUSCULAR | Status: AC
  Filled 2019-08-11: qty 2

## 2019-08-11 NOTE — H&P (Signed)
Chief Complaint: Patient was seen in consultation today for random liver biopsy at the request of Mann,Jyothi  Referring Physician(s): Mann,Jyothi  Supervising Physician: Sandi Mariscal  Patient Status: [facility] - Out-pt  History of Present Illness: Susan [name] is a 69 y.o. female   Pt was seen in hospital Jan 2021 Encephalopathy  Hx IDDM; HTN; HLD; non compliance CKD 4 +UTI--- treated and improved  Work up also revealed elevated liver functions  CT abdomen that showed cholelithiasis, MRCP showed cholelithiasis without evidence of cholecystitis or choledocholithiasis.  Referred to Dr Collene Mares Now scheduled for random liver biopsy  Pt has since had resolution of confusion UTI treated Better compliance with Diab meds and HTN meds   Denies any other complaints other than nervous about biopsy   Past Medical History:  Diagnosis Date  . Acute MI ([place]) 1999; 2007  . Anemia    hx  . Anginal pain ([place])   . Anxiety   . ARF (acute renal failure) ([place]) 06/2017   [facility]  . Arthritis    "generalized" (03/15/2014)  . CAD (coronary artery disease)    MI in 2000 - MI  2007 - treated bare metal stent (no nuclear since then as 9/11)  . Carotid artery disease ([place])   . CHF (congestive heart failure) ([place])   . Chronic diastolic heart failure (HCC)    a) ECHO (08/2013) EF 55-60% and RV function nl b) RHC (08/2013) RA 4, RV 30/5/7, PA 25/10 (16), PCWP 7, Fick CO/CI 6.3/2.7, PVR 1.5 WU, PA 61 and 66%  . Daily headache    "~ every other day; since I fell in June" (03/15/2014)  . Depression   . Dyslipidemia   . Dyspnea   . Exertional shortness of breath   . HTN (hypertension)   . Hypothyroidism   . Neuropathy   . Obesity   . Osteoarthritis   . Peripheral neuropathy   . PONV (postoperative nausea and vomiting)   . RBBB (right bundle branch block)    Old  . Stroke [facility])    mini strokes  . Syncope    likely due to low blood sugar  . Tachycardia    Sinus tachycardia    . Type II diabetes mellitus (HCC)    Type II  . Urinary incontinence   . Venous insufficiency     Past Surgical History:  Procedure Laterality Date  . ABDOMINAL HYSTERECTOMY  1980's  . AMPUTATION Right 02/24/2018   Procedure: RIGHT FOOT GREAT TOE AND 2ND TOE AMPUTATION;  Surgeon: Newt Minion, MD;  Location: [name];  Service: Orthopedics;  Laterality: Right;  . AMPUTATION Right 04/30/2018   Procedure: RIGHT TRANSMETATARSAL AMPUTATION;  Surgeon: Newt Minion, MD;  Location: [place];  Service: Orthopedics;  Laterality: Right;  . CATARACT EXTRACTION, BILATERAL Bilateral ?2013  . COLONOSCOPY W/ POLYPECTOMY    . COLONOSCOPY WITH PROPOFOL N/A 03/13/2019   Procedure: COLONOSCOPY WITH PROPOFOL;  Surgeon: Jerene Bears, MD;  Location: [place];  Service: Gastroenterology;  Laterality: N/A;  . [place]; 2007   "1 + 1"  . ESOPHAGOGASTRODUODENOSCOPY (EGD) WITH PROPOFOL N/A 03/13/2019   Procedure: ESOPHAGOGASTRODUODENOSCOPY (EGD) WITH PROPOFOL;  Surgeon: Jerene Bears, MD;  Location: [facility] ENDOSCOPY;  Service: Gastroenterology;  Laterality: N/A;  . EYE SURGERY Bilateral    l[place]  . HEMOSTASIS CLIP PLACEMENT  03/13/2019   Procedure: HEMOSTASIS CLIP PLACEMENT;  Surgeon: Jerene Bears, MD;  Location: [facility] ENDOSCOPY;  Service: Gastroenterology;;  .  KNEE ARTHROSCOPY Left 10/25/2006  . POLYPECTOMY  03/13/2019   Procedure: POLYPECTOMY;  Surgeon: Jerene Bears, MD;  Location: [place];  Service: Gastroenterology;;  . RIGHT HEART CATH N/A 07/24/2017   Procedure: RIGHT HEART CATH;  Surgeon: Jolaine Artist, MD;  Location: [place] CV LAB;  Service: Cardiovascular;  Laterality: N/A;  . RIGHT HEART CATHETERIZATION N/A 09/22/2013   Procedure: RIGHT HEART CATH;  Surgeon: Jolaine Artist, MD;  Location: [facility] CATH LAB;  Service: Cardiovascular;  Laterality: N/A;  . SHOULDER ARTHROSCOPY WITH OPEN ROTATOR CUFF REPAIR Right 03/14/2014   Procedure: RIGHT SHOULDER  ARTHROSCOPY WITH BICEPS RELEASE, OPEN SUBSCAPULA REPAIR, OPEN SUPRASPINATUS REPAIR.;  Surgeon: Meredith Pel, MD;  Location: [place];  Service: Orthopedics;  Laterality: Right;  . TOE AMPUTATION Right 02/24/2018   GREAT TOE AND 2ND TOE AMPUTATION  . TUBAL LIGATION  1970's    Allergies: Codeine  Medications: Prior to Admission medications   Medication Sig Start Date End Date Taking? Authorizing Provider  albuterol (VENTOLIN HFA) 108 (90 Base) MCG/ACT inhaler TAKE 2 PUFFS BY MOUTH EVERY 6 HOURS AS NEEDED FOR WHEEZE OR SHORTNESS OF BREATH Patient taking differently: Inhale 2 puffs into the lungs every 6 (six) hours as needed for wheezing or shortness of breath.  06/27/19  Yes [place], [name], MD  allopurinol (ZYLOPRIM) 100 MG tablet TAKE 1 TABLET BY MOUTH TWICE A DAY Patient taking differently: Take 100 mg by mouth 2 (two) times daily.  08/02/19  Yes Newt Minion, MD  Ascorbic Acid (VITAMIN C) 1000 MG tablet Take 1,000 mg by mouth daily.   Yes [provider]  aspirin EC 81 MG tablet Take 81 mg by mouth every morning.    Yes [provider]  carvedilol (COREG) 12.5 MG tablet TAKE 1 TABLET BY MOUTH TWICE A DAY WITH MEALS Patient taking differently: Take 12.5 mg by mouth 2 (two) times daily with a meal.  05/12/19  Yes Daggett, [name], MD  cholecalciferol (VITAMIN D) 1000 units tablet Take 1,000 Units by mouth daily with supper.    Yes [provider]  clopidogrel (PLAVIX) 75 MG tablet TAKE 1 TABLET BY MOUTH EVERY DAY WITH BREAKFAST 05/11/19  Yes Clegg, Amy D, NP  colchicine 0.6 MG tablet Take 1 tablet (0.6 mg total) by mouth daily. As needed for gout flare Patient not taking: Reported on 08/08/2019 10/14/18  Yes Newt Minion, MD  escitalopram (LEXAPRO) 20 MG tablet TAKE 1 TABLET BY MOUTH EVERY DAY Patient taking differently: Take 20 mg by mouth daily.  05/11/19  Yes [name], MD  ferrous sulfate 325 (65 FE) MG tablet Take 1 tablet (325 mg total) by mouth  2 (two) times daily with a meal. Reported on 08/21/2015 06/07/18  Yes [place], [name], MD  insulin regular (NOVOLIN R) 100 units/mL injection Inject 7-17 Units into the skin 3 (three) times daily before meals. Manually add bolus to continuous dose via OmniPod 3 times daily per sliding scale: CBG 80-150 7 units, 151-200 9 units, 201-250 12 units, 251-300 14 units, 301-400 17 units.   Yes [provider]  levothyroxine (SYNTHROID, LEVOTHROID) 50 MCG tablet Take 1 tablet (50 mcg total) by mouth daily before breakfast. 11/07/16  Yes Dixon, Mary B, PA-C  magnesium oxide (MAG-OX) 400 MG tablet TAKE 1 TABLET BY MOUTH EVERY DAY Patient taking differently: Take 400 mg by mouth daily.  09/17/18  Yes Willow, [name], MD  metolazone (ZAROXOLYN) 2.5 MG tablet Take 2.5 mg  by mouth once a week. Wednesday   Yes [provider]  nitroGLYCERIN (NITROSTAT) 0.4 MG SL tablet Place 1 tablet (0.4 mg total) under the tongue every 5 (five) minutes as needed for chest pain. 04/28/19  Yes Clegg, Amy D, NP  pregabalin (LYRICA) 150 MG capsule Take 1 capsule (150 mg total) by mouth 2 (two) times daily. 11/12/16  Yes Ault, [name], MD  torsemide (DEMADEX) 20 MG tablet Take 80 mg by mouth 2 (two) times daily.   Yes [provider]  traZODone (DESYREL) 100 MG tablet Take 1.5 tablets (150 mg total) by mouth at bedtime as needed for sleep. 07/08/19  Yes Crawford, [name], MD  vitamin E (VITAMIN E) 1000 UNIT capsule Take 1,000 Units by mouth daily with supper.    Yes [provider]  Insulin Disposable Pump (OMNIPOD DASH 5 PACK PODS) MISC Inject into the skin. Use continuously with Novolin R - change every 72 hours. 11/18/18   [provider]  insulin NPH Human (HUMULIN N,NOVOLIN N) 100 UNIT/ML injection Inject 20 Units into the skin See admin instructions. Inject 20 units subcutaneously in the morning if CBG >200    [provider]  [facility] VERIO test strip  11/02/17   [provider]  pantoprazole (PROTONIX) 40 MG tablet Take 1 tablet (40 mg total) by mouth daily. 04/28/19   Darrick Grinder D, NP     Family History  Problem Relation Age of Onset  . Heart attack Mother 81    Social History   Socioeconomic History  . Marital status: Married    Spouse name: Not on file  . Number of children: 3  . Years of education: 12th  . Highest education level: Not on file  Occupational History    Employer: UNEMPLOYED  Tobacco Use  . Smoking status: Former Smoker    Packs/day: 3.00    Years: 32.00    Pack years: 96.00    Types: Cigarettes    Quit date: 10/24/1997    Years since quitting: 21.8  . Smokeless tobacco: Never Used  Substance and Sexual Activity  . Alcohol use: Not Currently    Comment: "might have 2-3 daiquiris in the summer"  . Drug use: No  . Sexual activity: Never  Other Topics Concern  . Not on file  Social History Narrative   Pt lives at home with her spouse.   Caffeine Use- 3 sodas daily   Social Determinants of Health   Financial Resource Strain:   . Difficulty of Paying Living Expenses:   Food Insecurity:   . Worried About Charity fundraiser in the Last Year:   . Arboriculturist in the Last Year:   Transportation Needs: No Transportation Needs  . Lack of Transportation (Medical): No  . Lack of Transportation (Non-Medical): No  Physical Activity: Inactive  . Days of Exercise per Week: 0 days  . Minutes of Exercise per Session: 10 min  Stress:   . Feeling of Stress :   Social Connections:   . Frequency of Communication with Friends and Family:   . Frequency of Social Gatherings with Friends and Family:   . Attends Religious Services:   . Active Member of Clubs or Organizations:   . Attends Archivist Meetings:   Marland Kitchen Marital Status:     Review of Systems: A 12 point ROS discussed and pertinent positives are indicated in the HPI above.  All other systems are negative.  Review of  Systems  Constitutional: Positive for  fatigue. Negative for activity change, appetite change, fever and unexpected weight change.  Respiratory: Negative for cough and shortness of breath.   Cardiovascular: Negative for chest pain.  Gastrointestinal: Positive for nausea. Negative for abdominal pain.  Neurological: Negative for weakness.  Psychiatric/Behavioral: Negative for behavioral problems and confusion.    Vital Signs: BP (!) 164/66   Pulse 69   Temp 97.7 F (36.5 C) (Tympanic)   Ht 5\' 6"  (1.676 m)   Wt 260 lb (117.9 kg)   SpO2 93%   BMI 41.97 kg/m   Physical Exam Vitals reviewed.  Cardiovascular:     Rate and Rhythm: Normal rate and regular rhythm.     Heart sounds: Normal heart sounds.  Pulmonary:     Effort: Pulmonary effort is normal.     Breath sounds: Normal breath sounds.  Abdominal:     Palpations: Abdomen is soft.  Musculoskeletal:        General: Normal range of motion.  Skin:    General: Skin is warm and dry.  Neurological:     Mental Status: She is alert and oriented to person, place, and time.  Psychiatric:        Behavior: Behavior normal.        Thought Content: Thought content normal.        Judgment: Judgment normal.     Imaging: No results found.  Labs:  CBC: Recent Labs    05/30/19 0416 06/01/19 0133 06/02/19 0244 06/07/19 1627  WBC 5.2 6.5 7.4 10.9*  HGB 11.3* 11.4* 10.4* 10.5*  HCT 36.3 36.5 33.0* 34.7*  PLT 155 143* 166 312    COAGS: Recent Labs    05/28/19 1330  INR 1.1  APTT 28    BMP: Recent Labs    06/01/19 0133 06/01/19 0133 06/02/19 0244 06/03/19 0309 06/07/19 1627 08/08/19 1415  NA 132*   < > 135 133* 134* 135  K 5.1   < > 4.4 3.8 3.8 3.7  CL 104   < > 107 103 98 97*  CO2 19*   < > 21* 22 26 27   GLUCOSE 358*   < > 364* 273* 430* 135*  BUN 50*   < > 48* 43* 48* 96*  CALCIUM 7.6*   < > 7.9* 7.9* 8.4* 9.1  CREATININE 2.45*   < > 2.16* 1.89* 1.65* 2.28*  GFRNONAA 19*  --  23* 27*  --  21*  GFRAA 23*  --  26* 31*  --  24*   < > = values in  this interval not displayed.    LIVER FUNCTION TESTS: Recent Labs    05/31/19 0619 05/31/19 0619 06/01/19 0133 06/02/19 0244 06/03/19 0309 06/07/19 1627  BILITOT 1.3*   < > 2.3* 1.0 1.5* 0.8  AST 84*   < > 104* 83* 80* 23  ALT 142*   < > 121* 97* 85* 39*  ALKPHOS 654*  --  697* 750* 862*  --   PROT 4.3*   < > 4.6* 4.6* 4.6* 5.4*  ALBUMIN 1.7*  --  1.8* 1.7* 1.7*  --    < > = values in this interval not displayed.    TUMOR MARKERS: No results for input(s): AFPTM, CEA, CA199, CHROMGRNA in the last 8760 hours.  Assessment and Plan:  Elevated liver functions Cholelithiasis without obstruction Request for random liver biopsy Risks and benefits of liver biopsy was discussed with the patient and/or patient's family including, but not limited  to bleeding, infection, damage to adjacent structures or low yield requiring additional tests.  All of the questions were answered and there is agreement to proceed.  Consent signed and in chart.    Thank you for this interesting consult.  I greatly enjoyed meeting Susan [name] and look forward to participating in their care.  A copy of this report was sent to the requesting provider on this date.  Electronically Signed: Lavonia Drafts, PA-C 08/11/2019, 12:10 PM   I spent a total of  30 Minutes   in face to face in clinical consultation, greater than 50% of which was counseling/coordinating care for random liver biopsy

## 2019-08-11 NOTE — Procedures (Signed)
Pre Procedure Dx: Elevated LFTs of uncertain etiology Post Procedural Dx: Same  Technically successful US guided biopsy of right lobe of the liver.  EBL: None No immediate complications.   Jay Lakeyta Vandenheuvel, MD Pager #: [phone]    

## 2019-08-11 NOTE — Discharge Instructions (Signed)
Liver Biopsy, Care After These instructions give you information on caring for yourself after your procedure. Your doctor may also give you more specific instructions. Call your doctor if you have any problems or questions after your procedure. What can I expect after the procedure? After the procedure, it is common to have:  Pain and soreness where the biopsy was done.  Bruising around the area where the biopsy was done.  Sleepiness and be tired for a few days. Follow these instructions at home: Medicines  Take over-the-counter and prescription medicines only as told by your doctor.  If you were prescribed an antibiotic medicine, take it as told by your doctor. Do not stop taking the antibiotic even if you start to feel better.  Do not take medicines such as aspirin and ibuprofen. These medicines can thin your blood. Do not take these medicines unless your doctor tells you to take them.  If you are taking prescription pain medicine, take actions to prevent or treat constipation. Your doctor may recommend that you: ? Drink enough fluid to keep your pee (urine) clear or pale yellow. ? Take over-the-counter or prescription medicines. ? Eat foods that are high in fiber, such as fresh fruits and vegetables, whole grains, and beans. ? Limit foods that are high in fat and processed sugars, such as fried and sweet foods. Caring for your cut  Follow instructions from your doctor about how to take care of your cuts from surgery (incisions). Make sure you: ? Wash your hands with soap and water before you change your bandage (dressing). If you cannot use soap and water, use hand sanitizer. ? Remove your bandage as told by your doctor. In 24 hours  Check your cuts every day for signs of infection. Check for: ? Redness, swelling, or more pain. ? Fluid or blood. ? Pus or a bad smell. ? Warmth.  Do not take baths, swim, or use a hot tub until your doctor says it is okay to do  so. Activity   Rest at home for 1-2 days or as told by your doctor. ? Avoid sitting for a long time without moving. Get up to take short walks every 1-2 hours.  Return to your normal activities as told by your doctor. Ask what activities are safe for you.  Do not do these things in the first 24 hours: ? Drive. ? Use machinery. ? Take a bath or shower.  Do not lift more than 10 pounds (4.5 kg) or play contact sports for the first 2 weeks. General instructions   Do not drink alcohol in the first week after the procedure.  Have someone stay with you for at least 24 hours after the procedure.  Get your test results. Ask your doctor or the department that is doing the test: ? When will my results be ready? ? How will I get my results? ? What are my treatment options? ? What other tests do I need? ? What are my next steps?  Keep all follow-up visits as told by your doctor. This is important. Contact a doctor if:  A cut bleeds and leaves more than just a small spot of blood.  A cut is red, puffs up (swells), or hurts more than before.  Fluid or something else comes from a cut.  A cut smells bad.  You have a fever or chills. Get help right away if:  You have swelling, bloating, or pain in your belly (abdomen).  You get dizzy or faint.    You have a rash.  You feel sick to your stomach (nauseous) or throw up (vomit).  You have trouble breathing, feel short of breath, or feel faint.  Your chest hurts.  You have problems talking or seeing.  You have trouble with your balance or moving your arms or legs. Summary  After the procedure, it is common to have pain, soreness, bruising, and tiredness.  Your doctor will tell you how to take care of yourself at home. Change your bandage, take your medicines, and limit your activities as told by your doctor.  Call your doctor if you have symptoms of infection. Get help right away if your belly swells, your cut bleeds a lot, or  you have trouble talking or breathing. This information is not intended to replace advice given to you by your health care provider. Make sure you discuss any questions you have with your health care provider. Document Revised: 05/22/2017 Document Reviewed: 05/22/2017 Elsevier Patient Education  2020 [employer]  

## 2019-08-16 LAB — SURGICAL PATHOLOGY

## 2019-08-17 ENCOUNTER — Other Ambulatory Visit (HOSPITAL_COMMUNITY): Payer: PPO

## 2019-08-19 ENCOUNTER — Other Ambulatory Visit: Payer: Self-pay

## 2019-08-19 ENCOUNTER — Ambulatory Visit (HOSPITAL_COMMUNITY)
Admission: RE | Admit: 2019-08-19 | Discharge: 2019-08-19 | Disposition: A | Discharge status: Home / Self Care | Payer: PPO | Source: Ambulatory Visit | Attending: Internal Medicine | Admitting: Internal Medicine

## 2019-08-19 DIAGNOSIS — I5032 Chronic diastolic (congestive) heart failure: Secondary | ICD-10-CM

## 2019-08-19 LAB — BASIC METABOLIC PANEL
Anion gap: 12 (ref 5–15)
BUN: 74 mg/dL — ABNORMAL HIGH (ref 8–23)
CO2: 26 mmol/L (ref 22–32)
Calcium: 8.7 mg/dL — ABNORMAL LOW (ref 8.9–10.3)
Chloride: 96 mmol/L — ABNORMAL LOW (ref 98–111)
Creatinine, Ser: 2.16 mg/dL — ABNORMAL HIGH (ref 0.44–1.00)
GFR calc Af Amer: 26 mL/min — ABNORMAL LOW (ref >60)
GFR calc non Af Amer: 22 mL/min — ABNORMAL LOW (ref >60)
Glucose, Bld: 238 mg/dL — ABNORMAL HIGH (ref 70–99)
Potassium: 3.9 mmol/L (ref 3.5–5.1)
Sodium: 134 mmol/L — ABNORMAL LOW (ref 135–145)

## 2019-08-19 NOTE — Telephone Encounter (Signed)
Patient aware of results See lab reports Repeat labs 3/26

## 2019-08-25 ENCOUNTER — Ambulatory Visit (INDEPENDENT_AMBULATORY_CARE_PROVIDER_SITE_OTHER): Payer: PPO | Admitting: Orthopedic Surgery

## 2019-08-25 ENCOUNTER — Encounter: Payer: Self-pay | Admitting: Orthopedic Surgery

## 2019-08-25 ENCOUNTER — Other Ambulatory Visit: Payer: Self-pay

## 2019-08-25 VITALS — Ht 65.0 in | Wt 257.0 lb

## 2019-08-25 DIAGNOSIS — L97919 Non-pressure chronic ulcer of unspecified part of right lower leg with unspecified severity: Secondary | ICD-10-CM

## 2019-08-25 DIAGNOSIS — L97929 Non-pressure chronic ulcer of unspecified part of left lower leg with unspecified severity: Secondary | ICD-10-CM | POA: Diagnosis not present

## 2019-08-25 DIAGNOSIS — I87333 Chronic venous hypertension (idiopathic) with ulcer and inflammation of bilateral lower extremity: Secondary | ICD-10-CM

## 2019-08-29 ENCOUNTER — Encounter: Payer: Self-pay | Admitting: Orthopedic Surgery

## 2019-08-29 NOTE — Progress Notes (Signed)
Office Visit Note   Patient: Susan [name]           Date of Birth: 11/08/1950           MRN: [id number] Visit Date: 08/25/2019              Requested by: Alycia Rossetti, MD [address] [place],  [place] [address] PCP: Alycia Rossetti, MD  Chief Complaint  Patient presents with  . Left Foot - Follow-up      HPI: Patient is a 69 year old woman with diabetic insensate neuropathy venous insufficiency with chronic venous ulcers.  She is currently wearing the medical compression stockings she states she loves the socks she states she is not having any trouble ambulating with a cane.  Assessment & Plan: Visit Diagnoses:  1. Idiopathic chronic venous hypertension of both lower extremities with ulcer and inflammation ([place])     Plan: Recommend continue with the medical compression stockings recommended a size extra-large she is currently wearing double extra-large.  Follow-Up Instructions: Return in about 3 months (around 11/24/2019).   Ortho Exam  Patient is alert, oriented, no adenopathy, well-dressed, normal affect, normal respiratory effort. Examination patient venous ulcers have healed there is no redness no cellulitis her calf measures 43 cm in circumference with decreased swelling.  Imaging: No results found. No images are attached to the encounter.  Labs: Lab Results  Component Value Date   HGBA1C 13.1 (H) 05/23/2019   HGBA1C 12.0 (H) 03/11/2019   HGBA1C 8.3 (H) 04/30/2018   CRP 14.4 (H) 05/28/2019   CRP 8.4 (H) 03/05/2018   LABURIC 9.1 (H) 10/12/2018   LABURIC 9.0 (H) 03/06/2018   REPTSTATUS 05/30/2019 FINAL 05/28/2019   CULT 30,000 COLONIES/mL KLEBSIELLA PNEUMONIAE (A) 05/28/2019   LABORGA KLEBSIELLA PNEUMONIAE (A) 05/28/2019     Lab Results  Component Value Date   ALBUMIN 1.7 (L) 06/03/2019   ALBUMIN 1.7 (L) 06/02/2019   ALBUMIN 1.8 (L) 06/01/2019   LABURIC 9.1 (H) 10/12/2018   LABURIC 9.0 (H) 03/06/2018    Lab Results  Component Value Date     MG 1.6 02/27/2017   MG 2.1 07/10/2015   MG 1.7 09/21/2013   No results found for: VD25OH  No results found for: PREALBUMIN CBC EXTENDED Latest Ref Rng & Units 08/11/2019 06/07/2019 06/02/2019  WBC 4.0 - 10.5 K/uL 7.4 10.9(H) 7.4  RBC 3.87 - 5.11 MIL/uL 4.86 4.17 4.07  HGB 12.0 - 15.0 g/dL 12.5 10.5(L) 10.4(L)  HCT 36.0 - 46.0 % 40.1 34.7(L) 33.0(L)  PLT 150 - 400 K/uL 234 312 166  NEUTROABS 1,500 - 7,800 cells/uL - 8,535(H) -  LYMPHSABS 850 - 3,900 cells/uL - 959 -     Body mass index is 42.77 kg/m.  Orders:  No orders of the defined types were placed in this encounter.  No orders of the defined types were placed in this encounter.    Procedures: No procedures performed  Clinical Data: No additional findings.  ROS:  All other systems negative, except as noted in the HPI. Review of Systems  Objective: Vital Signs: Ht 5\' 5"  (1.651 m)   Wt 257 lb (116.6 kg)   BMI 42.77 kg/m   Specialty Comments:  No specialty comments available.  PMFS History: Patient Active Problem List   Diagnosis Date Noted  . Common bile duct (CBD) obstruction 05/28/2019  . Benign neoplasm of ascending colon   . Benign neoplasm of transverse colon   . Benign neoplasm of descending colon   .  Benign neoplasm of sigmoid colon   . Gastric polyps   . Hyperkalemia 03/11/2019  . Prolonged QT interval 03/11/2019  . Onychomycosis 06/21/2018  . Pressure injury of skin 03/19/2018  . Syncope 03/18/2018  . Osteomyelitis of second toe of right foot ([place])   . Toe osteomyelitis, right ([place])   . 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 ([place])   . PVD (peripheral vascular disease) ([place]) 10/26/2017  . Hypothyroid 07/27/2017  . PAH (pulmonary artery hypertension) ([place])   . Impaired ambulation 07/19/2017  . Leg cramps 02/27/2017  . Peripheral edema 01/12/2017  . Diabetic neuropathy ([place]) 11/12/2016  . 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 mellitus ([place]) 10/24/2015  . Anemia 10/03/2015   . Generalized anxiety disorder 10/03/2015  . Insomnia 10/03/2015  . Chest pain   . Gastritis 06/07/2015  . Diabetes mellitus type II, uncontrolled ([place]) 06/07/2015  . Chronic diastolic CHF (congestive heart failure) ([place]) 06/07/2015  . Non compliance with medical treatment 04/17/2014  . Rotator cuff tear 03/14/2014  . Morbid obesity ([place]) 09/23/2013  . Acute kidney injury superimposed on chronic kidney disease ([place]) 12/25/2012  . Hypotension 12/25/2012  . Urinary incontinence   . MDD (major depressive disorder) 11/12/2010  . RBBB (right bundle branch block)   . CAD (coronary artery disease)   . Hyperlipemia 01/22/2009  . Essential hypertension 01/22/2009   Past Medical History:  Diagnosis Date  . Acute MI ([place]) 1999; 2007  . Anemia    hx  . Anginal pain ([place])   . Anxiety   . ARF (acute renal failure) ([place]) 06/2017   [facility]  . Arthritis    "generalized" (03/15/2014)  . CAD (coronary artery disease)    MI in 2000 - MI  2007 - treated bare metal stent (no nuclear since then as 9/11)  . Carotid artery disease ([place])   . CHF (congestive heart failure) ([place])   . Chronic diastolic heart failure (HCC)    a) ECHO (08/2013) EF 55-60% and RV function nl b) RHC (08/2013) RA 4, RV 30/5/7, PA 25/10 (16), PCWP 7, Fick CO/CI 6.3/2.7, PVR 1.5 WU, PA 61 and 66%  . Daily headache    "~ every other day; since I fell in June" (03/15/2014)  . Depression   . Dyslipidemia   . Dyspnea   . Exertional shortness of breath   . HTN (hypertension)   . Hypothyroidism   . Neuropathy   . Obesity   . Osteoarthritis   . Peripheral neuropathy   . PONV (postoperative nausea and vomiting)   . RBBB (right bundle branch block)    Old  . Stroke [place])    mini strokes  . Syncope    likely due to low blood sugar  . Tachycardia    Sinus tachycardia  . Type II diabetes mellitus (HCC)    Type II  . Urinary incontinence   . Venous insufficiency     Family History  Problem Relation Age of  Onset  . Heart attack Mother 73    Past Surgical History:  Procedure Laterality Date  . ABDOMINAL HYSTERECTOMY  1980's  . AMPUTATION Right 02/24/2018   Procedure: RIGHT FOOT GREAT TOE AND 2ND TOE AMPUTATION;  Surgeon: Newt Minion, MD;  Location: [facility];  Service: Orthopedics;  Laterality: Right;  . AMPUTATION Right 04/30/2018   Procedure: RIGHT TRANSMETATARSAL AMPUTATION;  Surgeon: Newt Minion, MD;  Location: [facility];  Service: Orthopedics;  Laterality: Right;  . CATARACT  EXTRACTION, BILATERAL Bilateral ?2013  . COLONOSCOPY W/ POLYPECTOMY    . COLONOSCOPY WITH PROPOFOL N/A 03/13/2019   Procedure: COLONOSCOPY WITH PROPOFOL;  Surgeon: Jerene Bears, MD;  Location: [facility];  Service: Gastroenterology;  Laterality: N/A;  . [place]; 2007   "1 + 1"  . ESOPHAGOGASTRODUODENOSCOPY (EGD) WITH PROPOFOL N/A 03/13/2019   Procedure: ESOPHAGOGASTRODUODENOSCOPY (EGD) WITH PROPOFOL;  Surgeon: Jerene Bears, MD;  Location[facility] ENDOSCOPY;  Service: Gastroenterology;  Laterality: N/A;  . EYE SURGERY Bilateral    [place]  . HEMOSTASIS CLIP PLACEMENT  03/13/2019   Procedure: HEMOSTASIS CLIP PLACEMENT;  Surgeon: Jerene Bears, MD;  Location[facility]COPY;  Service: Gastroenterology;;  . KNEE ARTHROSCOPY Left 10/25/2006  . POLYPECTOMY  03/13/2019   Procedure: POLYPECTOMY;  Surgeon: Jerene Bears, MD;  Location: [facility];  Service: Gastroenterology;;  . RIGHT HEART CATH N/A 07/24/2017   Procedure: RIGHT HEART CATH;  Surgeon: Jolaine Artist, MD;  Location: [facility] CV LAB;  Service: Cardiovascular;  Laterality: N/A;  . RIGHT HEART CATHETERIZATION N/A 09/22/2013   Procedure: RIGHT HEART CATH;  Surgeon: Jolaine Artist, MD;  Location: [facility] CATH LAB;  Service: Cardiovascular;  Laterality: N/A;  . SHOULDER ARTHROSCOPY WITH OPEN ROTATOR CUFF REPAIR Right 03/14/2014   Procedure: RIGHT SHOULDER ARTHROSCOPY WITH BICEPS RELEASE, OPEN SUBSCAPULA REPAIR, OPEN  SUPRASPINATUS REPAIR.;  Surgeon: Meredith Pel, MD;  Location: [facility];  Service: Orthopedics;  Laterality: Right;  . TOE AMPUTATION Right 02/24/2018   GREAT TOE AND 2ND TOE AMPUTATION  . TUBAL LIGATION  1970's   Social History   Occupational History    Employer: UNEMPLOYED  Tobacco Use  . Smoking status: Former Smoker    Packs/day: 3.00    Years: 32.00    Pack years: 96.00    Types: Cigarettes    Quit date: 10/24/1997    Years since quitting: 21.8  . Smokeless tobacco: Never Used  Substance and Sexual Activity  . Alcohol use: Not Currently    Comment: "might have 2-3 daiquiris in the summer"  . Drug use: No  . Sexual activity: Never

## 2019-08-30 ENCOUNTER — Encounter (HOSPITAL_COMMUNITY): Payer: Self-pay | Admitting: Cardiology

## 2019-09-02 ENCOUNTER — Encounter: Payer: Self-pay | Admitting: Family Medicine

## 2019-09-02 ENCOUNTER — Ambulatory Visit (INDEPENDENT_AMBULATORY_CARE_PROVIDER_SITE_OTHER): Payer: PPO | Admitting: Family Medicine

## 2019-09-02 ENCOUNTER — Other Ambulatory Visit: Payer: Self-pay | Admitting: *Deleted

## 2019-09-02 ENCOUNTER — Other Ambulatory Visit: Payer: Self-pay

## 2019-09-02 VITALS — BP 112/80 | HR 50 | Temp 98.1°F | Resp 18 | Ht 66.0 in | Wt 251.0 lb

## 2019-09-02 DIAGNOSIS — N390 Urinary tract infection, site not specified: Secondary | ICD-10-CM

## 2019-09-02 DIAGNOSIS — K529 Noninfective gastroenteritis and colitis, unspecified: Secondary | ICD-10-CM

## 2019-09-02 DIAGNOSIS — E860 Dehydration: Secondary | ICD-10-CM | POA: Diagnosis not present

## 2019-09-02 DIAGNOSIS — N184 Chronic kidney disease, stage 4 (severe): Secondary | ICD-10-CM | POA: Diagnosis not present

## 2019-09-02 DIAGNOSIS — I5032 Chronic diastolic (congestive) heart failure: Secondary | ICD-10-CM | POA: Diagnosis not present

## 2019-09-02 DIAGNOSIS — E1165 Type 2 diabetes mellitus with hyperglycemia: Secondary | ICD-10-CM | POA: Diagnosis not present

## 2019-09-02 DIAGNOSIS — R319 Hematuria, unspecified: Secondary | ICD-10-CM | POA: Diagnosis not present

## 2019-09-02 DIAGNOSIS — E1122 Type 2 diabetes mellitus with diabetic chronic kidney disease: Secondary | ICD-10-CM

## 2019-09-02 LAB — GLUCOSE 16585
Glucose: >444 mg/dL — ABNORMAL HIGH (ref 65–99)
Glucose: >444 mg/dL — ABNORMAL HIGH (ref 65–99)

## 2019-09-02 LAB — COMPREHENSIVE METABOLIC PANEL
AG Ratio: 1.2 (calc) (ref 1.0–2.5)
ALT: 12 U/L (ref 6–29)
AST: 13 U/L (ref 10–35)
Albumin: 3.4 g/dL — ABNORMAL LOW (ref 3.6–5.1)
Alkaline phosphatase (APISO): 140 U/L (ref 37–153)
BUN/Creatinine Ratio: 31 (calc) — ABNORMAL HIGH (ref 6–22)
BUN: 94 mg/dL — ABNORMAL HIGH (ref 7–25)
CO2: 27 mmol/L (ref 20–32)
Calcium: 8.7 mg/dL (ref 8.6–10.4)
Chloride: 93 mmol/L — ABNORMAL LOW (ref 98–110)
Creat: 3 mg/dL — ABNORMAL HIGH (ref 0.60–0.93)
Globulin: 2.8 g/dL (calc) (ref 1.9–3.7)
Glucose, Bld: 509 mg/dL (ref 65–99)
Potassium: 4.1 mmol/L (ref 3.5–5.3)
Sodium: 130 mmol/L — ABNORMAL LOW (ref 135–146)
Total Bilirubin: 0.3 mg/dL (ref 0.2–1.2)
Total Protein: 6.2 g/dL (ref 6.1–8.1)

## 2019-09-02 LAB — URINALYSIS, ROUTINE W REFLEX MICROSCOPIC
Bilirubin Urine: NEGATIVE
Hyaline Cast: NONE SEEN /LPF
Ketones, ur: NEGATIVE
Nitrite: NEGATIVE
Specific Gravity, Urine: 1.01 (ref 1.001–1.03)
pH: 5.5 (ref 5.0–8.0)

## 2019-09-02 LAB — CBC WITH DIFFERENTIAL/PLATELET
Absolute Monocytes: 469 cells/uL (ref 200–950)
Basophils Absolute: 40 cells/uL (ref 0–200)
Basophils Relative: 0.6 %
Eosinophils Absolute: 581 cells/uL — ABNORMAL HIGH (ref 15–500)
Eosinophils Relative: 8.8 %
HCT: 37.4 % (ref 35.0–45.0)
Hemoglobin: 11.6 g/dL — ABNORMAL LOW (ref 11.7–15.5)
Lymphs Abs: 647 cells/uL — ABNORMAL LOW (ref 850–3900)
MCH: 26.4 pg — ABNORMAL LOW (ref 27.0–33.0)
MCHC: 31 g/dL — ABNORMAL LOW (ref 32.0–36.0)
MCV: 85.2 fL (ref 80.0–100.0)
MPV: 12.4 fL (ref 7.5–12.5)
Monocytes Relative: 7.1 %
Neutro Abs: 4864 cells/uL (ref 1500–7800)
Neutrophils Relative %: 73.7 %
Platelets: 227 10*3/uL (ref 140–400)
RBC: 4.39 10*6/uL (ref 3.80–5.10)
RDW: 15.7 % — ABNORMAL HIGH (ref 11.0–15.0)
Total Lymphocyte: 9.8 %
WBC: 6.6 10*3/uL (ref 3.8–10.8)

## 2019-09-02 LAB — MICROSCOPIC MESSAGE

## 2019-09-02 LAB — LIPASE: Lipase: 46 U/L (ref 7–60)

## 2019-09-02 MED ORDER — CIPROFLOXACIN HCL 500 MG PO TABS: 500.0000 mg | ORAL_TABLET | Freq: Two times a day (BID) | ORAL | 0 refills | Status: DC

## 2019-09-02 MED ORDER — INSULIN ASPART 100 UNIT/ML ~~LOC~~ SOLN
15.0000 [IU] | Freq: Once | SUBCUTANEOUS | Status: AC
  Administered 2019-09-02: 15 [IU] via SUBCUTANEOUS

## 2019-09-02 NOTE — Progress Notes (Signed)
Subjective:    Patient ID: Susan [name], female    DOB: 1951-02-24, 69 y.o.   MRN: [id number]  Patient presents for Diarrhea Patient originally scheduled for telehealth however due to her chronic medical problems and severity of her history I brought her in for an office visit.  Saturday she received her second COVID-19 vaccine Sunday started having diarrhea, body aches, dizziness and weakness.  Symptoms have progressed throughout the week. Diarrhea stopped yesterday. No vomiting. She felt warm and had the sweats earlier in the week, last episode was Wed.  No cough or congestion. No known sick contacts  DM uncontrolled, CBG all week has been in the 500's, states CBG yesterday 557 She has been taking extra 20 units of Novolin N in the morning and sliding scale with meals  She is taking plavix , has not noticed any blood in urine or stool   She was able to eat yesterday evening- lean cuisine dinner. This morning has some dry heaves so has not had breakfast.  She is asked not take any of her insulin her medications this morning either.  She is not urinating very much- despite taking her fluid pills but he did urinate some this morning.    Review Of Systems:  GEN-+ fatigue, fever, weight loss,weakness, recent illness HEENT- denies eye drainage, change in vision, nasal discharge, CVS- denies chest pain, palpitations RESP- denies SOB, cough, wheeze ABD- + N/V, +change in stools, abd pain GU- denies dysuria, hematuria, dribbling, incontinence MSK- denies joint pain, muscle aches, injury Neuro- denies headache, dizziness, syncope, seizure activity       Objective:    BP 112/80   Pulse (!) 50   Temp 98.1 F (36.7 C) (Temporal)   Resp 18   Ht 5\' 6"  (1.676 m)   Wt 251 lb (113.9 kg)   SpO2 94%   BMI 40.51 kg/m  GEN- NAD, alert and oriented x3, nontoxic-appearing HEENT- PERRL, EOMI, non injected sclera, pink conjunctiva, MMM, oropharynx clear Neck- Supple, no thyromegaly CVS-  RRR, heart rate 60s no murmur RESP-CTAB ABD-NABS,soft,NT,ND no CVA tenderness EXT-chronic venous stasis changes but decreased edema Pulses- Radial,2+        Assessment & Plan:      Problem List Items Addressed This Visit      Unprioritized   Chronic diastolic CHF (congestive heart failure) (HCC) (Chronic)   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 mellitus (HCC)   Diabetes mellitus type II, uncontrolled ([name])    Possible she had medicated effects from side effects of vaccine but also has gastroenteritis symptoms.  Her diarrhea has resolved but I think this is left her dehydrated in the setting of her known uncontrolled diabetes mellitus chronic kidney disease and she is on high-dose diuretics which she did continue to take.  Her blood pressure looks okay she is not significantly hypotensive.  We did attempt to place IV for fluids but was unsuccessful after 3 attempts.  Were able to obtain stat labs.  Her random glucose in the office was unable to be read on our machine meaning that it is greater than 500 currently.  Per above she is not taking any insulin today so she was given 15 units of NovoLog in the office in water to drink. After sitting for 1 hour blood sugar is still > 450, but since she has not eaten I am concerned about giving her higher doses of insulin and we dont have an IV   Discussed going to the emergency  room for fluid she does not want to do so.  We will have her keep pushing fluids at home.  Her stat labs have been sent .  Pending results if she is in worse renal function or other significant metabolic abnormality I will again highly suggest she go to the emergency room  She was ambulating at baseline, not confused   UA concerning for infection especially in setting of recent diarrhea, will cipro, which will help with compliance with dosing and had enteritis    Will have her hold on diuretic when she gets home until we get results of renal function        Relevant Orders    Glucose, Random   GLUCOSE 02217 (Completed)   Glucose, fingerstick (stat)   GLUCOSE 98102 (Completed)    Other Visit Diagnoses    Gastroenteritis    -  Primary   Relevant Orders   CBC with Differential/Platelet   Comprehensive metabolic panel   Lipase   Urinalysis, Routine w reflex microscopic (Completed)   Dehydration       Relevant Orders   CBC with Differential/Platelet   Comprehensive metabolic panel   Urinalysis, Routine w reflex microscopic (Completed)   Urinary tract infection with hematuria, site unspecified       Relevant Orders   Microscopic Message (Completed)      Note: This dictation was prepared with Dragon dictation along with smaller phrase technology. Any transcriptional errors that result from this process are unintentional.

## 2019-09-02 NOTE — Assessment & Plan Note (Addendum)
Possible she had medicated effects from side effects of vaccine but also has gastroenteritis symptoms.  Her diarrhea has resolved but I think this is left her dehydrated in the setting of her known uncontrolled diabetes mellitus chronic kidney disease and she is on high-dose diuretics which she did continue to take.  Her blood pressure looks okay she is not significantly hypotensive.  We did attempt to place IV for fluids but was unsuccessful after 3 attempts.  Were able to obtain stat labs.  Her random glucose in the office was unable to be read on our machine meaning that it is greater than 500 currently.  Per above she is not taking any insulin today so she was given 15 units of NovoLog in the office in water to drink. After sitting for 1 hour blood sugar is still > 450, but since she has not eaten I am concerned about giving her higher doses of insulin and we dont have an IV   Discussed going to the emergency room for fluid she does not want to do so.  We will have her keep pushing fluids at home.  Her stat labs have been sent .  Pending results if she is in worse renal function or other significant metabolic abnormality I will again highly suggest she go to the emergency room  She was ambulating at baseline, not confused   UA concerning for infection especially in setting of recent diarrhea, will cipro, which will help with compliance with dosing and had enteritis    Will have her hold on diuretic when she gets home until we get results of renal function

## 2019-09-05 ENCOUNTER — Other Ambulatory Visit: Payer: PPO

## 2019-09-05 ENCOUNTER — Telehealth (HOSPITAL_COMMUNITY): Payer: Self-pay | Admitting: *Deleted

## 2019-09-05 DIAGNOSIS — E1165 Type 2 diabetes mellitus with hyperglycemia: Secondary | ICD-10-CM | POA: Diagnosis not present

## 2019-09-05 DIAGNOSIS — E860 Dehydration: Secondary | ICD-10-CM

## 2019-09-05 LAB — BASIC METABOLIC PANEL WITH GFR
BUN/Creatinine Ratio: 38 (calc) — ABNORMAL HIGH (ref 6–22)
BUN: 82 mg/dL — ABNORMAL HIGH (ref 7–25)
CO2: 27 mmol/L (ref 20–32)
Calcium: 9.1 mg/dL (ref 8.6–10.4)
Chloride: 99 mmol/L (ref 98–110)
Creat: 2.17 mg/dL — ABNORMAL HIGH (ref 0.60–0.93)
GFR, Est African American: 26 mL/min/{1.73_m2} — ABNORMAL LOW (ref >=60)
GFR, Est Non African American: 22 mL/min/{1.73_m2} — ABNORMAL LOW (ref >=60)
Glucose, Bld: 274 mg/dL — ABNORMAL HIGH (ref 65–99)
Potassium: 4.6 mmol/L (ref 3.5–5.3)
Sodium: 136 mmol/L (ref 135–146)

## 2019-09-05 NOTE — Telephone Encounter (Signed)
Pt returned call she is aware of results.    Kerry Dory, [place]  08/30/2019 10:28 AM EDT    [facility]  Letter mailed    Harvie Junior, [place]  08/29/2019 12:47 PM EDT    Pt left VM for lab results. I called pt back on preferred line and received a busy tone. I also called her mobile # no answer/left vm requesting return call.    Kerry Dory, [place]  08/24/2019 9:48 AM EDT    [phone] (H) LMOM   Consuelo Pandy, PA-C  08/23/2019 5:41 PM EDT    SCr still elevated. Recommend reducing metolazone to 1 day a week. Can take a 2nd weekly dose as needed if weight gain.

## 2019-09-08 DIAGNOSIS — K5730 Diverticulosis of large intestine without perforation or abscess without bleeding: Secondary | ICD-10-CM | POA: Diagnosis not present

## 2019-09-08 DIAGNOSIS — K8080 Other cholelithiasis without obstruction: Secondary | ICD-10-CM | POA: Diagnosis not present

## 2019-09-08 DIAGNOSIS — R748 Abnormal levels of other serum enzymes: Secondary | ICD-10-CM | POA: Diagnosis not present

## 2019-09-13 ENCOUNTER — Other Ambulatory Visit: Payer: Self-pay | Admitting: Family Medicine

## 2019-09-19 ENCOUNTER — Telehealth: Payer: Self-pay | Admitting: *Deleted

## 2019-09-19 ENCOUNTER — Other Ambulatory Visit: Payer: Self-pay

## 2019-09-19 ENCOUNTER — Emergency Department (HOSPITAL_COMMUNITY)
Admission: EM | Admit: 2019-09-19 | Discharge: 2019-09-20 | Disposition: A | Discharge status: Home / Self Care | Payer: PPO | Attending: Emergency Medicine | Admitting: Emergency Medicine

## 2019-09-19 DIAGNOSIS — Z79899 Other long term (current) drug therapy: Secondary | ICD-10-CM | POA: Insufficient documentation

## 2019-09-19 DIAGNOSIS — Z794 Long term (current) use of insulin: Secondary | ICD-10-CM | POA: Diagnosis not present

## 2019-09-19 DIAGNOSIS — Z87891 Personal history of nicotine dependence: Secondary | ICD-10-CM | POA: Diagnosis not present

## 2019-09-19 DIAGNOSIS — R609 Edema, unspecified: Secondary | ICD-10-CM | POA: Insufficient documentation

## 2019-09-19 DIAGNOSIS — N184 Chronic kidney disease, stage 4 (severe): Secondary | ICD-10-CM | POA: Insufficient documentation

## 2019-09-19 DIAGNOSIS — I130 Hypertensive heart and chronic kidney disease with heart failure and stage 1 through stage 4 chronic kidney disease, or unspecified chronic kidney disease: Secondary | ICD-10-CM | POA: Insufficient documentation

## 2019-09-19 DIAGNOSIS — I5032 Chronic diastolic (congestive) heart failure: Secondary | ICD-10-CM | POA: Diagnosis not present

## 2019-09-19 DIAGNOSIS — E039 Hypothyroidism, unspecified: Secondary | ICD-10-CM | POA: Diagnosis not present

## 2019-09-19 DIAGNOSIS — I2510 Atherosclerotic heart disease of native coronary artery without angina pectoris: Secondary | ICD-10-CM | POA: Insufficient documentation

## 2019-09-19 DIAGNOSIS — E1122 Type 2 diabetes mellitus with diabetic chronic kidney disease: Secondary | ICD-10-CM | POA: Insufficient documentation

## 2019-09-19 DIAGNOSIS — I252 Old myocardial infarction: Secondary | ICD-10-CM | POA: Diagnosis not present

## 2019-09-19 DIAGNOSIS — Z7982 Long term (current) use of aspirin: Secondary | ICD-10-CM | POA: Insufficient documentation

## 2019-09-19 DIAGNOSIS — Z7901 Long term (current) use of anticoagulants: Secondary | ICD-10-CM | POA: Insufficient documentation

## 2019-09-19 DIAGNOSIS — R197 Diarrhea, unspecified: Secondary | ICD-10-CM | POA: Diagnosis not present

## 2019-09-19 DIAGNOSIS — E860 Dehydration: Secondary | ICD-10-CM | POA: Insufficient documentation

## 2019-09-19 DIAGNOSIS — R112 Nausea with vomiting, unspecified: Secondary | ICD-10-CM | POA: Insufficient documentation

## 2019-09-19 LAB — COMPREHENSIVE METABOLIC PANEL
ALT: 15 U/L (ref 0–44)
AST: 21 U/L (ref 15–41)
Albumin: 3.3 g/dL — ABNORMAL LOW (ref 3.5–5.0)
Alkaline Phosphatase: 105 U/L (ref 38–126)
Anion gap: 10 (ref 5–15)
BUN: 39 mg/dL — ABNORMAL HIGH (ref 8–23)
CO2: 24 mmol/L (ref 22–32)
Calcium: 9.4 mg/dL (ref 8.9–10.3)
Chloride: 102 mmol/L (ref 98–111)
Creatinine, Ser: 2.05 mg/dL — ABNORMAL HIGH (ref 0.44–1.00)
GFR calc Af Amer: 28 mL/min — ABNORMAL LOW (ref >60)
GFR calc non Af Amer: 24 mL/min — ABNORMAL LOW (ref >60)
Glucose, Bld: 270 mg/dL — ABNORMAL HIGH (ref 70–99)
Potassium: 3.6 mmol/L (ref 3.5–5.1)
Sodium: 136 mmol/L (ref 135–145)
Total Bilirubin: 1 mg/dL (ref 0.3–1.2)
Total Protein: 6.4 g/dL — ABNORMAL LOW (ref 6.5–8.1)

## 2019-09-19 LAB — CBC
HCT: 42.2 % (ref 36.0–46.0)
Hemoglobin: 13 g/dL (ref 12.0–15.0)
MCH: 26 pg (ref 26.0–34.0)
MCHC: 30.8 g/dL (ref 30.0–36.0)
MCV: 84.4 fL (ref 80.0–100.0)
Platelets: 268 10*3/uL (ref 150–400)
RBC: 5 MIL/uL (ref 3.87–5.11)
RDW: 16 % — ABNORMAL HIGH (ref 11.5–15.5)
WBC: 8.2 10*3/uL (ref 4.0–10.5)
nRBC: 0 % (ref 0.0–0.2)

## 2019-09-19 LAB — LIPASE, BLOOD: Lipase: 28 U/L (ref 11–51)

## 2019-09-19 MED ORDER — SODIUM CHLORIDE 0.9% FLUSH: 3.0000 mL | Freq: Once | INTRAVENOUS | Status: DC

## 2019-09-19 NOTE — ED Triage Notes (Signed)
Pt here for evaluation of mild RLQ abdominal pain, nausea, and diarrhea since last Wednesday. Unable to keep any PO intake down.

## 2019-09-19 NOTE — ED Notes (Signed)
Pt. Given a cup for specimen, unable to go at the present

## 2019-09-19 NOTE — Telephone Encounter (Signed)
Received call from patient.  Reports vomiting since 09/14/2019. States that she cannot keep anything down as she is nauseated.   Advised to go to ER for evaluation. Advised that she may need fluids/ imaging.   Verbalized understanding. Reports that she will contact her spouse to see if he can take her to ER now.

## 2019-09-19 NOTE — Telephone Encounter (Signed)
Agree, this is 2nd time, unable to get IV in the office a couple of weeks ago Needs to go to ER

## 2019-09-20 DIAGNOSIS — R197 Diarrhea, unspecified: Secondary | ICD-10-CM | POA: Diagnosis not present

## 2019-09-20 DIAGNOSIS — R112 Nausea with vomiting, unspecified: Secondary | ICD-10-CM | POA: Diagnosis not present

## 2019-09-20 DIAGNOSIS — E860 Dehydration: Secondary | ICD-10-CM | POA: Diagnosis not present

## 2019-09-20 LAB — URINALYSIS, ROUTINE W REFLEX MICROSCOPIC
Bilirubin Urine: NEGATIVE
Glucose, UA: >=500 mg/dL — AB
Ketones, ur: 5 mg/dL — AB
Nitrite: NEGATIVE
Protein, ur: >=300 mg/dL — AB
Specific Gravity, Urine: 1.02 (ref 1.005–1.030)
pH: 5 (ref 5.0–8.0)

## 2019-09-20 MED ORDER — ONDANSETRON 4 MG PO TBDP
4.0000 mg | ORAL_TABLET | Freq: Three times a day (TID) | ORAL | 0 refills | Status: DC | PRN
Start: 2019-09-20 — End: 2019-11-01

## 2019-09-20 MED ORDER — SODIUM CHLORIDE 0.9 % IV BOLUS
1000.0000 mL | Freq: Once | INTRAVENOUS | Status: AC
  Administered 2019-09-20: 1000 mL via INTRAVENOUS

## 2019-09-20 MED ORDER — ONDANSETRON HCL 4 MG/2ML IJ SOLN
4.0000 mg | Freq: Once | INTRAMUSCULAR | Status: AC
  Administered 2019-09-20: 05:00:00 4 mg via INTRAVENOUS
  Filled 2019-09-20: qty 2

## 2019-09-20 NOTE — ED Notes (Signed)
Pt sitting up in bed. Provided pt with ginger ale for PO challenge.

## 2019-09-20 NOTE — ED Provider Notes (Signed)
[place] [facility] EMERGENCY DEPARTMENT Provider Note   CSN: [id number] Arrival date & time: 09/19/19  1246     History Chief Complaint  Patient presents with  . Diarrhea  . Nausea    Susan [name] is a 68 y.o. female.  HPI     This is a 69 year old female with a history of renal failure, coronary artery disease, heart failure, hypertension who presents with vomiting and diarrhea.  Patient reports that since Wednesday of last week she has had multiple episodes of nonbilious, nonbloody emesis and nonbloody diarrhea.  She states that anytime anything hits her stomach "vomit it up."  Patient reports intermittent abdominal cramping.  Currently she is pain free.  She states the cramping is over her entire abdomen.  She reports chills without documented fevers.  She does report recent antibiotic use for possible UTI.  She believes she took doxycycline.  Of note, I reviewed her chart.  She had recent similar symptoms in early April.  She was treated with ciprofloxacin.  Patient reports she has been unable to urinate since she has been here for the last 15 hours.  Patient denies any known sick contacts.  No known Covid exposures.  She does report receiving both of her Covid vaccinations.  Past Medical History:  Diagnosis Date  . Acute MI ([place]) 1999; 2007  . Anemia    hx  . Anginal pain ([place])   . Anxiety   . ARF (acute renal failure) ([place]) 06/2017   [facility]  . Arthritis    "generalized" (03/15/2014)  . CAD (coronary artery disease)    MI in 2000 - MI  2007 - treated bare metal stent (no nuclear since then as 9/11)  . Carotid artery disease ([place])   . CHF (congestive heart failure) ([place])   . Chronic diastolic heart failure (HCC)    a) ECHO (08/2013) EF 55-60% and RV function nl b) RHC (08/2013) RA 4, RV 30/5/7, PA 25/10 (16), PCWP 7, Fick CO/CI 6.3/2.7, PVR 1.5 WU, PA 61 and 66%  . Daily headache    "~ every other day; since I fell in June" (03/15/2014)  .  Depression   . Dyslipidemia   . Dyspnea   . Exertional shortness of breath   . HTN (hypertension)   . Hypothyroidism   . Neuropathy   . Obesity   . Osteoarthritis   . Peripheral neuropathy   . PONV (postoperative nausea and vomiting)   . RBBB (right bundle branch block)    Old  . Stroke [facility])    mini strokes  . Syncope    likely due to low blood sugar  . Tachycardia    Sinus tachycardia  . Type II diabetes mellitus (HCC)    Type II  . Urinary incontinence   . Venous insufficiency     Patient Active Problem List   Diagnosis Date Noted  . Common bile duct (CBD) obstruction 05/28/2019  . Benign neoplasm of ascending colon   . Benign neoplasm of transverse colon   . Benign neoplasm of descending colon   . Benign neoplasm of sigmoid colon   . Gastric polyps   . Hyperkalemia 03/11/2019  . Prolonged QT interval 03/11/2019  . Onychomycosis 06/21/2018  . Pressure injury of skin 03/19/2018  . Syncope 03/18/2018  . Osteomyelitis of second toe of right foot ([place])   . Toe osteomyelitis, right ([place])   . 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 ([place])   . PVD (  peripheral vascular disease) ([place]) 10/26/2017  . Hypothyroid 07/27/2017  . PAH (pulmonary artery hypertension) ([name])   . Impaired ambulation 07/19/2017  . Leg cramps 02/27/2017  . Peripheral edema 01/12/2017  . Diabetic neuropathy ([place]) 11/12/2016  . 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 mellitus ([place]) 10/24/2015  . Anemia 10/03/2015  . Generalized anxiety disorder 10/03/2015  . Insomnia 10/03/2015  . Chest pain   . Gastritis 06/07/2015  . Diabetes mellitus type II, uncontrolled ([place]) 06/07/2015  . Chronic diastolic CHF (congestive heart failure) ([place]) 06/07/2015  . Non compliance with medical treatment 04/17/2014  . Rotator cuff tear 03/14/2014  . Morbid obesity ([place]) 09/23/2013  . Acute kidney injury superimposed on chronic kidney disease ([place]) 12/25/2012  . Hypotension 12/25/2012  . Urinary  incontinence   . MDD (major depressive disorder) 11/12/2010  . RBBB (right bundle branch block)   . CAD (coronary artery disease)   . Hyperlipemia 01/22/2009  . Essential hypertension 01/22/2009    Past Surgical History:  Procedure Laterality Date  . ABDOMINAL HYSTERECTOMY  1980's  . AMPUTATION Right 02/24/2018   Procedure: RIGHT FOOT GREAT TOE AND 2ND TOE AMPUTATION;  Surgeon: Newt Minion, MD;  Location: [place];  Service: Orthopedics;  Laterality: Right;  . AMPUTATION Right 04/30/2018   Procedure: RIGHT TRANSMETATARSAL AMPUTATION;  Surgeon: Newt Minion, MD;  Location: [place];  Service: Orthopedics;  Laterality: Right;  . CATARACT EXTRACTION, BILATERAL Bilateral ?2013  . COLONOSCOPY W/ POLYPECTOMY    . COLONOSCOPY WITH PROPOFOL N/A 03/13/2019   Procedure: COLONOSCOPY WITH PROPOFOL;  Surgeon: Jerene Bears, MD;  Location: [place];  Service: Gastroenterology;  Laterality: N/A;  . [place]; 2007   "1 + 1"  . ESOPHAGOGASTRODUODENOSCOPY (EGD) WITH PROPOFOL N/A 03/13/2019   Procedure: ESOPHAGOGASTRODUODENOSCOPY (EGD) WITH PROPOFOL;  Surgeon: Jerene Bears, MD;  Location: [facility] ENDOSCOPY;  Service: Gastroenterology;  Laterality: N/A;  . EYE SURGERY Bilateral    [place]  . HEMOSTASIS CLIP PLACEMENT  03/13/2019   Procedure: HEMOSTASIS CLIP PLACEMENT;  Surgeon: Jerene Bears, MD;  Location: [facility] ENDOSCOPY;  Service: Gastroenterology;;  . KNEE ARTHROSCOPY Left 10/25/2006  . POLYPECTOMY  03/13/2019   Procedure: POLYPECTOMY;  Surgeon: Jerene Bears, MD;  Location: [place];  Service: Gastroenterology;;  . RIGHT HEART CATH N/A 07/24/2017   Procedure: RIGHT HEART CATH;  Surgeon: Jolaine Artist, MD;  Location: [place] CV LAB;  Service: Cardiovascular;  Laterality: N/A;  . RIGHT HEART CATHETERIZATION N/A 09/22/2013   Procedure: RIGHT HEART CATH;  Surgeon: Jolaine Artist, MD;  Location: [facility] CATH LAB;  Service: Cardiovascular;  Laterality: N/A;  .  SHOULDER ARTHROSCOPY WITH OPEN ROTATOR CUFF REPAIR Right 03/14/2014   Procedure: RIGHT SHOULDER ARTHROSCOPY WITH BICEPS RELEASE, OPEN SUBSCAPULA REPAIR, OPEN SUPRASPINATUS REPAIR.;  Surgeon: Meredith Pel, MD;  Location: [place];  Service: Orthopedics;  Laterality: Right;  . TOE AMPUTATION Right 02/24/2018   GREAT TOE AND 2ND TOE AMPUTATION  . TUBAL LIGATION  1970's     OB History   No obstetric history on file.     Family History  Problem Relation Age of Onset  . Heart attack Mother 55    Social History   Tobacco Use  . Smoking status: Former Smoker    Packs/day: 3.00    Years: 32.00    Pack years: 96.00    Types: Cigarettes    Quit date: 10/24/1997    Years since quitting: 21.9  . Smokeless  tobacco: Never Used  Substance Use Topics  . Alcohol use: Not Currently    Comment: "might have 2-3 daiquiris in the summer"  . Drug use: No    Home Medications Prior to Admission medications   Medication Sig Start Date End Date Taking? Authorizing Provider  albuterol (VENTOLIN HFA) 108 (90 Base) MCG/ACT inhaler TAKE 2 PUFFS BY MOUTH EVERY 6 HOURS AS NEEDED FOR WHEEZE OR SHORTNESS OF BREATH Patient taking differently: Inhale 2 puffs into the lungs every 6 (six) hours as needed for wheezing or shortness of breath.  06/27/19  Yes [place], [name], MD  allopurinol (ZYLOPRIM) 100 MG tablet TAKE 1 TABLET BY MOUTH TWICE A DAY Patient taking differently: Take 100 mg by mouth 2 (two) times daily.  08/02/19  Yes Newt Minion, MD  Ascorbic Acid (VITAMIN C) 1000 MG tablet Take 1,000 mg by mouth daily.   Yes [provider]  aspirin EC 81 MG tablet Take 81 mg by mouth every morning.    Yes [provider]  carvedilol (COREG) 12.5 MG tablet TAKE 1 TABLET BY MOUTH TWICE A DAY WITH MEALS Patient taking differently: Take 12.5 mg by mouth 2 (two) times daily with a meal.  05/12/19  Yes [place], [name], MD  cholecalciferol (VITAMIN D) 1000 units tablet Take 1,000 Units by mouth  daily with supper.    Yes [provider]  clopidogrel (PLAVIX) 75 MG tablet TAKE 1 TABLET BY MOUTH EVERY DAY WITH BREAKFAST Patient taking differently: Take 75 mg by mouth daily.  05/11/19  Yes Clegg, Amy D, NP  escitalopram (LEXAPRO) 20 MG tablet TAKE 1 TABLET BY MOUTH EVERY DAY Patient taking differently: Take 20 mg by mouth daily.  05/11/19  Yes [name], MD  ferrous sulfate 325 (65 FE) MG tablet Take 1 tablet (325 mg total) by mouth 2 (two) times daily with a meal. Reported on 08/21/2015 06/07/18  Yes [place], [name], MD  levothyroxine (SYNTHROID, LEVOTHROID) 50 MCG tablet Take 1 tablet (50 mcg total) by mouth daily before breakfast. 11/07/16  Yes Dena Billet B, PA-C  magnesium oxide (MAG-OX) 400 MG tablet TAKE 1 TABLET BY MOUTH EVERY DAY Patient taking differently: Take 400 mg by mouth daily.  09/13/19  Yes [place], [name], MD  metolazone (ZAROXOLYN) 2.5 MG tablet Take 2.5 mg by mouth once a week. Wednesday   Yes [provider]  Multiple Vitamin (MULTIVITAMIN WITH MINERALS) TABS tablet Take 1 tablet by mouth daily.   Yes [provider]  nitroGLYCERIN (NITROSTAT) 0.4 MG SL tablet Place 1 tablet (0.4 mg total) under the tongue every 5 (five) minutes as needed for chest pain. 04/28/19  Yes Clegg, Amy D, NP  pantoprazole (PROTONIX) 40 MG tablet Take 1 tablet (40 mg total) by mouth daily. 04/28/19  Yes Clegg, Amy D, NP  pregabalin (LYRICA) 150 MG capsule Take 1 capsule (150 mg total) by mouth 2 (two) times daily. 11/12/16  Yes [place], [name], MD  torsemide (DEMADEX) 20 MG tablet Take 80 mg by mouth 2 (two) times daily.   Yes [provider]  traZODone (DESYREL) 100 MG tablet Take 1.5 tablets (150 mg total) by mouth at bedtime as needed for sleep. 07/08/19  Yes [place], [name], MD  ursodiol (ACTIGALL) 500 MG tablet Take 500 mg by mouth 3 (three) times daily. 09/09/19  Yes [provider]  vitamin E (VITAMIN E) 1000 UNIT capsule Take 1,000 Units  by mouth daily with supper.    Yes  [provider]  ciprofloxacin (CIPRO) 500 MG tablet Take 1 tablet (500 mg total) by mouth 2 (two) times daily. Patient not taking: Reported on 09/20/2019 09/02/19   Alycia Rossetti, MD  colchicine 0.6 MG tablet Take 1 tablet (0.6 mg total) by mouth daily. As needed for gout flare Patient not taking: Reported on 08/08/2019 10/14/18   Newt Minion, MD  Insulin Disposable Pump (OMNIPOD DASH 5 PACK PODS) MISC Inject into the skin. Use continuously with Novolin R - change every 72 hours. 11/18/18   [provider]  insulin NPH Human (HUMULIN N,NOVOLIN N) 100 UNIT/ML injection Inject 20 Units into the skin See admin instructions. Inject 20 units subcutaneously in the morning if CBG >200    [provider]  insulin regular (NOVOLIN R) 100 units/mL injection Inject 7-17 Units into the skin 3 (three) times daily before meals. Manually add bolus to continuous dose via OmniPod 3 times daily per sliding scale: CBG 80-150 7 units, 151-200 9 units, 201-250 12 units, 251-300 14 units, 301-400 17 units.    [provider]  ondansetron (ZOFRAN ODT) 4 MG disintegrating tablet Take 1 tablet (4 mg total) by mouth every 8 (eight) hours as needed for nausea or vomiting. 09/20/19   Wendle Kina, Barbette Hair, MD  [facility] VERIO test strip  11/02/17   [provider]    Allergies    Codeine  Review of Systems   Review of Systems  Constitutional: Positive for chills. Negative for fever.  Respiratory: Negative for shortness of breath.   Cardiovascular: Negative for chest pain.  Gastrointestinal: Positive for diarrhea, nausea and vomiting. Negative for abdominal pain and blood in stool.  Genitourinary: Negative for dysuria.       Decreased urination  All other systems reviewed and are negative.   Physical Exam Updated Vital Signs BP (!) 159/75 (BP Location: Right Arm)   Pulse 86   Temp 98.8 F (37.1 C)   Resp 18   Ht 1.676 m (5\' 6" )   Wt  113.9 kg   SpO2 97%   BMI 40.51 kg/m   Physical Exam Vitals and nursing note reviewed.  Constitutional:      Appearance: She is well-developed.     Comments: Chronically ill-appearing but nontoxic  HENT:     Head: Normocephalic and atraumatic.     Mouth/Throat:     Mouth: Mucous membranes are dry.  Eyes:     Pupils: Pupils are equal, round, and reactive to light.  Cardiovascular:     Rate and Rhythm: Normal rate and regular rhythm.     Heart sounds: Normal heart sounds.  Pulmonary:     Effort: Pulmonary effort is normal. No respiratory distress.     Breath sounds: No wheezing.  Abdominal:     General: Bowel sounds are normal.     Palpations: Abdomen is soft.     Tenderness: There is no abdominal tenderness. There is no guarding or rebound.  Musculoskeletal:     Cervical back: Neck supple.     Right lower leg: Edema present.     Left lower leg: Edema present.     Comments: Amputations noted of the right toes  Skin:    General: Skin is warm and dry.  Neurological:     Mental Status: She is alert and oriented to person, place, and time.  Psychiatric:        Mood and Affect: Mood normal.     ED Results / Procedures / Treatments  Labs (all labs ordered are listed, but only abnormal results are displayed) Labs Reviewed  COMPREHENSIVE METABOLIC PANEL - Abnormal; Notable for the following components:      Result Value   Glucose, Bld 270 (*)    BUN 39 (*)    Creatinine, Ser 2.05 (*)    Total Protein 6.4 (*)    Albumin 3.3 (*)    GFR calc non Af Amer 24 (*)    GFR calc Af Amer 28 (*)    All other components within normal limits  CBC - Abnormal; Notable for the following components:   RDW 16.0 (*)    All other components within normal limits  URINALYSIS, ROUTINE W REFLEX MICROSCOPIC - Abnormal; Notable for the following components:   Color, Urine AMBER (*)    APPearance CLOUDY (*)    Glucose, UA >=500 (*)    Hgb urine dipstick SMALL (*)    Ketones, ur 5 (*)     Protein, ur >=300 (*)    Leukocytes,Ua SMALL (*)    Bacteria, UA RARE (*)    All other components within normal limits  GASTROINTESTINAL PANEL BY PCR, STOOL (REPLACES STOOL CULTURE)  C DIFFICILE QUICK SCREEN W PCR REFLEX  URINE CULTURE  LIPASE, BLOOD    EKG None  Radiology No results found.  Procedures Procedures (including critical care time)  Medications Ordered in ED Medications  sodium chloride flush (NS) 0.9 % injection 3 mL (has no administration in time range)  sodium chloride 0.9 % bolus 1,000 mL (1,000 mLs Intravenous New Bag/Given 09/20/19 0447)  ondansetron (ZOFRAN) injection 4 mg (4 mg Intravenous Given 09/20/19 0448)    ED Course  I have reviewed the triage vital signs and the nursing notes.  Pertinent labs & imaging results that were available during my care of the patient were reviewed by me and considered in my medical decision making (see chart for details).  Clinical Course as of Sep 19 641  Tue Sep 20, 2019  0619 On recheck, patient states she feels better after fluids.  She is tolerating oral hydration.  Still awaiting urinalysis.  If normal, we discussed supportive measures at home and discharge with Zofran.  Do not feel she needs imaging at this time.  She has not had any stools to test for GI pathogens or C. difficile.   [CH]    Clinical Course User Index [CH] Bladyn Tipps, Barbette Hair, MD   MDM Rules/Calculators/A&P                       Patient presents with nausea, vomiting, diarrhea.  Had a similar presentation to her primary physician several weeks ago.  She is overall nontoxic and vital signs are reassuring.  She does clinically appear dry.  She has recently been on antibiotics.  We will send stool studies if she is able to provide a stool sample.  Lab work-up reviewed.  It is largely comparable to her most recent lab work with a creatinine of around 2.  No significant leukocytosis.  Urinalysis with 0-5 white cells and rare bacteria.  She does have  some white cell clumps.  She just finished a course of antibiotics.  We will send a culture and defer treatment given that she is currently asymptomatic.  Patient was given a liter of fluids and Zofran.  She feels much better.  We will plan for close PCP follow-up.  She was not able to provide a stool sample for testing.  Given  that she has benign abdominal exam, do not feel she needs imaging.  Have low suspicion for intra-abdominal process including appendicitis, diverticulitis, cholecystitis.  Suspect viral etiology versus symptoms secondary to recent antibiotic use.  After history, exam, and medical workup I feel the patient has been appropriately medically screened and is safe for discharge home. Pertinent diagnoses were discussed with the patient. Patient was given return precautions.   Final Clinical Impression(s) / ED Diagnoses Final diagnoses:  Nausea, vomiting and diarrhea  Dehydration    Rx / DC Orders ED Discharge Orders         Ordered    ondansetron (ZOFRAN ODT) 4 MG disintegrating tablet  Every 8 hours PRN     09/20/19 6815           Merryl Hacker, MD 09/20/19 [phone]

## 2019-09-20 NOTE — Discharge Instructions (Addendum)
You were seen today for nausea, vomiting, diarrhea.  You are mildly dehydrated.  Take Zofran as needed for vomiting.  Follow-up closely with your primary physician.

## 2019-09-21 ENCOUNTER — Ambulatory Visit: Payer: PPO | Admitting: Family Medicine

## 2019-09-21 LAB — URINE CULTURE: Culture: NO GROWTH

## 2019-09-22 ENCOUNTER — Telehealth: Payer: Self-pay | Admitting: Family Medicine

## 2019-09-22 MED ORDER — PROMETHAZINE HCL 12.5 MG PO TABS
12.5000 mg | ORAL_TABLET | Freq: Three times a day (TID) | ORAL | 0 refills | Status: DC | PRN
Start: 2019-09-22 — End: 2019-11-01

## 2019-09-22 NOTE — Telephone Encounter (Signed)
CB # [phone] Was discharge from ER on 09-20-19 given fluids still feeling weak also nausea

## 2019-09-22 NOTE — Telephone Encounter (Signed)
Call placed to patient.   Reports that she is having increased N/D since returning from ER.   Reports that she was given Zofran, but it is not helping much. Prescription sent to pharmacy for Phenergan.   Reports that she is sipping on Gatorade, but is not able to tolerate much else.   Reports that she continues to have increased loose stools.   Also reports that CBG's remain >500. Advised to contact endo for recommendations on CBG. Advised that getting sugars down can help her feel better.   Patient has appointment Monday for F/U

## 2019-09-22 NOTE — Telephone Encounter (Signed)
Agree with below Okay to give phenergan Pt cancelled her f/u appt for Wed Can call endocrine for her diabets

## 2019-09-26 ENCOUNTER — Other Ambulatory Visit: Payer: Self-pay

## 2019-09-26 ENCOUNTER — Encounter: Payer: Self-pay | Admitting: Family Medicine

## 2019-09-26 ENCOUNTER — Ambulatory Visit (INDEPENDENT_AMBULATORY_CARE_PROVIDER_SITE_OTHER): Payer: PPO | Admitting: Family Medicine

## 2019-09-26 VITALS — BP 128/68 | HR 78 | Temp 98.7°F | Resp 16 | Ht 66.0 in | Wt 266.0 lb

## 2019-09-26 DIAGNOSIS — E1122 Type 2 diabetes mellitus with diabetic chronic kidney disease: Secondary | ICD-10-CM | POA: Diagnosis not present

## 2019-09-26 DIAGNOSIS — I5032 Chronic diastolic (congestive) heart failure: Secondary | ICD-10-CM

## 2019-09-26 DIAGNOSIS — N184 Chronic kidney disease, stage 4 (severe): Secondary | ICD-10-CM

## 2019-09-26 DIAGNOSIS — R300 Dysuria: Secondary | ICD-10-CM

## 2019-09-26 DIAGNOSIS — A084 Viral intestinal infection, unspecified: Secondary | ICD-10-CM | POA: Diagnosis not present

## 2019-09-26 LAB — URINALYSIS, ROUTINE W REFLEX MICROSCOPIC
Bacteria, UA: NONE SEEN /HPF
Bilirubin Urine: NEGATIVE
Hyaline Cast: NONE SEEN /LPF
Ketones, ur: NEGATIVE
Leukocytes,Ua: NEGATIVE
Nitrite: NEGATIVE
Specific Gravity, Urine: 1.01 (ref 1.001–1.03)
pH: 5 (ref 5.0–8.0)

## 2019-09-26 LAB — MICROSCOPIC MESSAGE

## 2019-09-26 MED ORDER — NYSTATIN 100000 UNIT/GM EX CREA: 1.0000 "application " | TOPICAL_CREAM | Freq: Two times a day (BID) | CUTANEOUS | 1 refills | Status: DC

## 2019-09-26 NOTE — Patient Instructions (Signed)
Stay on demadex 40mg  twice a day for now

## 2019-09-26 NOTE — Assessment & Plan Note (Signed)
Patient with diastolic heart failure in the setting of her other multiple comorbidities.  Her appetite is improved which likely contributes to her regaining her weight as she has had back to back illnesses over the past month in which she has had nausea vomiting diarrhea.  Her edema does not look significantly worse than her typical.  But if her renal function is okay I will plan to put her back on her previous dose of demadex 80 mg twice a day.  She will be rescheduling with her nephrologist that she missed that visit when she was sick a couple weeks ago.  Gastroenteritis  this is now resolved and she is back to eating at her baseline.  Diabetes mellitus uncontrolled and worsened in the setting of her illness.  She is already spoken with her endocrinologist and had her insulin adjusted.

## 2019-09-26 NOTE — Assessment & Plan Note (Signed)
>>  ASSESSMENT AND PLAN FOR CHRONIC DIASTOLIC CHF (CONGESTIVE HEART FAILURE) (HCC) WRITTEN ON 09/26/2019  3:04 PM BY Milinda Antis F  Patient with diastolic heart failure in the setting of her other multiple comorbidities.  Her appetite is improved which likely contributes to her regaining her weight as she has had back to back illnesses over the past month in which she has had nausea vomiting diarrhea.  Her edema does not look significantly worse than her typical.  But if her renal function is okay I will plan to put her back on her previous dose of demadex 80 mg twice a day.  She will be rescheduling with her nephrologist that she missed that visit when she was sick a couple weeks ago.  Gastroenteritis  this is now resolved and she is back to eating at her baseline.  Diabetes mellitus uncontrolled and worsened in the setting of her illness.  She is already spoken with her endocrinologist and had her insulin adjusted.

## 2019-09-26 NOTE — Progress Notes (Signed)
Subjective:    Patient ID: Susan [name], female    DOB: 07/16/1950, 69 y.o.   MRN: [id number]  Patient presents for Follow-up (is not fasting) and Dysuria (burning with urination)  Pt here for ER follow-up  DM- uncontrolled, states insulin was adjusted by her endocrinologist  Patient here with ongoing symptoms her kidney function was last 2.05, she had a cath specimen performed in the emergency room to see if she still has residual urinary tract infection which I treated recently.  This did not show any overwhelming sign of infection so she was not given any antibiotics.  She was given IV fluids and discharged home.  She states that she has had burning with urination for the past week and actually feels the same as when she went to the emergency room.  She still feels weak and no longer has the nausea vomiting and diarrhea.   She has not taken her fluid pill, but I did start the 40 mg twice a day on her torsemide in the setting of her renal failure along with her GI symptoms and dehydration.  Her weight today is 15lbs  States her appetite is normal now    Denies any chest pain shortness of breath.  No recent falls   Review Of Systems:  GEN- denies fatigue, fever, weight loss,+weakness, recent illness HEENT- denies eye drainage, change in vision, nasal discharge, CVS- denies chest pain, palpitations RESP- denies SOB, cough, wheeze ABD- denies N/V, change in stools, abd pain GU- +dysuria, hematuria, dribbling, incontinence MSK- denies joint pain, muscle aches, injury Neuro- denies headache, dizziness, syncope, seizure activity       Objective:    BP 128/68   Pulse 78   Temp 98.7 F (37.1 C) (Temporal)   Resp 16   Ht 5\' 6"  (1.676 m)   Wt 266 lb (120.7 kg)   SpO2 99%   BMI 42.93 kg/m  GEN- NAD, alert and oriented x3,sitting in chair HEENT- PERRL, EOMI, non injected sclera, pink conjunctiva, MMM, oropharynx clear Neck- Supple, no thyromegaly CVS- RRR, no  murmur RESP-CTAB ABD-NABS,soft,NT,ND, no CVA tenderness  EXT- 1+ pitting  edema Pulses- Radial palpated         Assessment & Plan:      Problem List Items Addressed This Visit      Unprioritized   Chronic diastolic CHF (congestive heart failure) (HCC) (Chronic)    Patient with diastolic heart failure in the setting of her other multiple comorbidities.  Her appetite is improved which likely contributes to her regaining her weight as she has had back to back illnesses over the past month in which she has had nausea vomiting diarrhea.  Her edema does not look significantly worse than her typical.  But if her renal function is okay I will plan to put her back on her previous dose of demadex 80 mg twice a day.  She will be rescheduling with her nephrologist that she missed that visit when she was sick a couple weeks ago.  Gastroenteritis  this is now resolved and she is back to eating at her baseline.  Diabetes mellitus uncontrolled and worsened in the setting of her illness.  She is already spoken with her endocrinologist and had her insulin adjusted.      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 mellitus ([place])   Relevant Orders   CBC with Differential/Platelet   Comprehensive metabolic panel    Other Visit Diagnoses    Dysuria    -  Primary   Relevant Orders   Urinalysis, Routine w reflex microscopic   Urine Culture   Viral enteritis       Relevant Medications   nystatin cream (MYCOSTATIN)      Note: This dictation was prepared with Dragon dictation along with smaller phrase technology. Any transcriptional errors that result from this process are unintentional.

## 2019-09-27 LAB — CBC WITH DIFFERENTIAL/PLATELET
Absolute Monocytes: 714 cells/uL (ref 200–950)
Basophils Absolute: 58 cells/uL (ref 0–200)
Basophils Relative: 0.7 %
Eosinophils Absolute: 490 cells/uL (ref 15–500)
Eosinophils Relative: 5.9 %
HCT: 36.8 % (ref 35.0–45.0)
Hemoglobin: 11.8 g/dL (ref 11.7–15.5)
Lymphs Abs: 1112 cells/uL (ref 850–3900)
MCH: 26.5 pg — ABNORMAL LOW (ref 27.0–33.0)
MCHC: 32.1 g/dL (ref 32.0–36.0)
MCV: 82.7 fL (ref 80.0–100.0)
MPV: 12.2 fL (ref 7.5–12.5)
Monocytes Relative: 8.6 %
Neutro Abs: 5926 cells/uL (ref 1500–7800)
Neutrophils Relative %: 71.4 %
Platelets: 287 10*3/uL (ref 140–400)
RBC: 4.45 10*6/uL (ref 3.80–5.10)
RDW: 15.3 % — ABNORMAL HIGH (ref 11.0–15.0)
Total Lymphocyte: 13.4 %
WBC: 8.3 10*3/uL (ref 3.8–10.8)

## 2019-09-27 LAB — COMPREHENSIVE METABOLIC PANEL
AG Ratio: 1.4 (calc) (ref 1.0–2.5)
ALT: 15 U/L (ref 6–29)
AST: 14 U/L (ref 10–35)
Albumin: 3.4 g/dL — ABNORMAL LOW (ref 3.6–5.1)
Alkaline phosphatase (APISO): 105 U/L (ref 37–153)
BUN/Creatinine Ratio: 29 (calc) — ABNORMAL HIGH (ref 6–22)
BUN: 56 mg/dL — ABNORMAL HIGH (ref 7–25)
CO2: 26 mmol/L (ref 20–32)
Calcium: 9 mg/dL (ref 8.6–10.4)
Chloride: 101 mmol/L (ref 98–110)
Creat: 1.91 mg/dL — ABNORMAL HIGH (ref 0.60–0.93)
Globulin: 2.4 g/dL (calc) (ref 1.9–3.7)
Glucose, Bld: 94 mg/dL (ref 65–99)
Potassium: 4 mmol/L (ref 3.5–5.3)
Sodium: 135 mmol/L (ref 135–146)
Total Bilirubin: 0.4 mg/dL (ref 0.2–1.2)
Total Protein: 5.8 g/dL — ABNORMAL LOW (ref 6.1–8.1)

## 2019-09-27 LAB — URINE CULTURE
MICRO NUMBER:: 10431845
Result:: NO GROWTH
SPECIMEN QUALITY:: ADEQUATE

## 2019-10-17 DIAGNOSIS — I129 Hypertensive chronic kidney disease with stage 1 through stage 4 chronic kidney disease, or unspecified chronic kidney disease: Secondary | ICD-10-CM | POA: Diagnosis not present

## 2019-10-17 DIAGNOSIS — L03119 Cellulitis of unspecified part of limb: Secondary | ICD-10-CM | POA: Diagnosis not present

## 2019-10-17 DIAGNOSIS — D631 Anemia in chronic kidney disease: Secondary | ICD-10-CM | POA: Diagnosis not present

## 2019-10-17 DIAGNOSIS — N2581 Secondary hyperparathyroidism of renal origin: Secondary | ICD-10-CM | POA: Diagnosis not present

## 2019-10-17 DIAGNOSIS — E1122 Type 2 diabetes mellitus with diabetic chronic kidney disease: Secondary | ICD-10-CM | POA: Diagnosis not present

## 2019-10-17 DIAGNOSIS — I509 Heart failure, unspecified: Secondary | ICD-10-CM | POA: Diagnosis not present

## 2019-10-17 DIAGNOSIS — N189 Chronic kidney disease, unspecified: Secondary | ICD-10-CM | POA: Diagnosis not present

## 2019-10-17 DIAGNOSIS — N184 Chronic kidney disease, stage 4 (severe): Secondary | ICD-10-CM | POA: Diagnosis not present

## 2019-10-17 DIAGNOSIS — G4733 Obstructive sleep apnea (adult) (pediatric): Secondary | ICD-10-CM | POA: Diagnosis not present

## 2019-10-30 ENCOUNTER — Other Ambulatory Visit: Payer: Self-pay | Admitting: Family Medicine

## 2019-11-01 ENCOUNTER — Ambulatory Visit (INDEPENDENT_AMBULATORY_CARE_PROVIDER_SITE_OTHER): Payer: PPO | Admitting: Family Medicine

## 2019-11-01 ENCOUNTER — Other Ambulatory Visit: Payer: Self-pay

## 2019-11-01 ENCOUNTER — Encounter: Payer: Self-pay | Admitting: Family Medicine

## 2019-11-01 VITALS — BP 128/82 | HR 86 | Temp 98.2°F | Resp 16 | Ht 66.0 in | Wt 267.0 lb

## 2019-11-01 DIAGNOSIS — L03116 Cellulitis of left lower limb: Secondary | ICD-10-CM

## 2019-11-01 DIAGNOSIS — S81802A Unspecified open wound, left lower leg, initial encounter: Secondary | ICD-10-CM | POA: Diagnosis not present

## 2019-11-01 DIAGNOSIS — E1165 Type 2 diabetes mellitus with hyperglycemia: Secondary | ICD-10-CM | POA: Diagnosis not present

## 2019-11-01 MED ORDER — SULFAMETHOXAZOLE-TRIMETHOPRIM 800-160 MG PO TABS
ORAL_TABLET | ORAL | 0 refills | Status: DC
Start: 2019-11-01 — End: 2020-01-31

## 2019-11-01 MED ORDER — HYDROCODONE-ACETAMINOPHEN 5-325 MG PO TABS: 1.0000 | ORAL_TABLET | Freq: Three times a day (TID) | ORAL | 0 refills | Status: DC | PRN

## 2019-11-01 NOTE — Progress Notes (Signed)
Subjective:    Patient ID: Susan [name], female    DOB: 1951-02-13, 69 y.o.   MRN: [id number]  Patient presents for Cellulitis (x weeks- started on inner thigh of L leg, but has moved up to groin- has had ABTx from nephrology with no improvement)  Pt here with cellulitis and wound of her left posterior leg.  She states that she had a spot on her leg with pain and drainage about 2 weeks ago.  She was seen by her nephrologist on the 23rd she was given doxycycline for a week but it did not help.  She is supposed to have an wound care visit but she never heard back.  Now she states the spot is more tender and she has a lot of drainage and bleeding that is always on her clothing or her bed sheets.  She has not had any fever or chills.  Her blood sugars however have been very high the past couple of weeks typically greater than 500.  She is currently on 22 units of in the morning and she is bolused with 17 units with meals through her Omni pod.  She has an appointment with her endocrinologist next week.   Review Of Systems:  GEN- denies fatigue, fever, weight loss,weakness, recent illness HEENT- denies eye drainage, change in vision, nasal discharge, CVS- denies chest pain, palpitations RESP- denies SOB, cough, wheeze ABD- denies N/V, change in stools, abd pain GU- denies dysuria, hematuria, dribbling, incontinence MSK- denies joint pain, +muscle aches, injury Neuro- denies headache, dizziness, syncope, seizure activity       Objective:    BP 128/82   Pulse 86   Temp 98.2 F (36.8 C) (Temporal)   Resp 16   Ht 5\' 6"  (1.676 m)   Wt 267 lb (121.1 kg)   SpO2 94%   BMI 43.09 kg/m  GEN- NAD, alert and oriented x3 HEENT- PERRL, EOMI, non injected sclera, pink conjunctiva, MMM, oropharynx clear CVS- RRR, no murmur RESP-CTAB Left post leg ulcerated wound 6x4cm , with surrounding erythema and induration TTP  EXT- 1+ non pitting  Edema, chronic venous stasis changes  Pulses- Radial, DP-  diminished         Assessment & Plan:      Problem List Items Addressed This Visit      Unprioritized   Diabetes mellitus type II, uncontrolled ([place])    Controlled diabetes mellitus now in the setting of open wound that is poorly healing.  We will increase her long acting to 22 units in the morning.  She will continue her bolus per endocrinology through her Omni pod.  For the ulcerated cellulitic leg wound I am going to get her an appointment with the wound center.  Unfortunately I do not think this will heal without some specialist involved.  She has circulatory problems uncontrolled diabetes mellitus and chronic edema venous stasis issues.  I will start her on Bactrim 1 tablet daily secondary to her chronic kidney disease stage IV.  She has been given hydrocodone as needed pain.  Her leg was wrapped with nonstick bandage in the office today.       Other Visit Diagnoses    Cellulitis of left lower extremity    -  Primary   Relevant Orders   Ambulatory referral to Wound Clinic   Wound of left lower extremity, initial encounter       Relevant Orders   Ambulatory referral to Wound Clinic      Note: This  dictation was prepared with Dragon dictation along with smaller phrase technology. Any transcriptional errors that result from this process are unintentional.

## 2019-11-01 NOTE — Patient Instructions (Addendum)
Take 22 units of N in the morning Take new antibiotic bactrim once a day  Pain medicine as needed Referral to wound clinic

## 2019-11-01 NOTE — Assessment & Plan Note (Signed)
Controlled diabetes mellitus now in the setting of open wound that is poorly healing.  We will increase her long acting to 22 units in the morning.  She will continue her bolus per endocrinology through her Omni pod.  For the ulcerated cellulitic leg wound I am going to get her an appointment with the wound center.  Unfortunately I do not think this will heal without some specialist involved.  She has circulatory problems uncontrolled diabetes mellitus and chronic edema venous stasis issues.  I will start her on Bactrim 1 tablet daily secondary to her chronic kidney disease stage IV.  She has been given hydrocodone as needed pain.  Her leg was wrapped with nonstick bandage in the office today.

## 2019-11-02 ENCOUNTER — Encounter: Payer: PPO | Attending: Internal Medicine | Admitting: Internal Medicine

## 2019-11-02 DIAGNOSIS — I5030 Unspecified diastolic (congestive) heart failure: Secondary | ICD-10-CM | POA: Insufficient documentation

## 2019-11-02 DIAGNOSIS — L97122 Non-pressure chronic ulcer of left thigh with fat layer exposed: Secondary | ICD-10-CM | POA: Diagnosis not present

## 2019-11-02 DIAGNOSIS — L03116 Cellulitis of left lower limb: Secondary | ICD-10-CM | POA: Diagnosis not present

## 2019-11-02 DIAGNOSIS — N185 Chronic kidney disease, stage 5: Secondary | ICD-10-CM | POA: Diagnosis not present

## 2019-11-02 DIAGNOSIS — E11622 Type 2 diabetes mellitus with other skin ulcer: Secondary | ICD-10-CM | POA: Diagnosis not present

## 2019-11-02 DIAGNOSIS — E039 Hypothyroidism, unspecified: Secondary | ICD-10-CM | POA: Diagnosis not present

## 2019-11-02 DIAGNOSIS — Z794 Long term (current) use of insulin: Secondary | ICD-10-CM | POA: Insufficient documentation

## 2019-11-02 DIAGNOSIS — I872 Venous insufficiency (chronic) (peripheral): Secondary | ICD-10-CM | POA: Diagnosis not present

## 2019-11-02 DIAGNOSIS — I87332 Chronic venous hypertension (idiopathic) with ulcer and inflammation of left lower extremity: Secondary | ICD-10-CM | POA: Diagnosis not present

## 2019-11-02 DIAGNOSIS — N184 Chronic kidney disease, stage 4 (severe): Secondary | ICD-10-CM | POA: Diagnosis not present

## 2019-11-02 DIAGNOSIS — E1122 Type 2 diabetes mellitus with diabetic chronic kidney disease: Secondary | ICD-10-CM | POA: Insufficient documentation

## 2019-11-02 DIAGNOSIS — L97121 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: Secondary | ICD-10-CM | POA: Insufficient documentation

## 2019-11-02 DIAGNOSIS — I2510 Atherosclerotic heart disease of native coronary artery without angina pectoris: Secondary | ICD-10-CM | POA: Insufficient documentation

## 2019-11-02 DIAGNOSIS — I890 Lymphedema, not elsewhere classified: Secondary | ICD-10-CM | POA: Diagnosis not present

## 2019-11-02 NOTE — Progress Notes (Signed)
ANA, SCHMUHL ([id number]) Visit Report for 11/02/2019 Abuse/Suicide Risk Screen Details Patient Name: Susan [name], Susan [name] Date of Service: 11/02/2019 10:30 AM Medical Record Number: [id number] Patient Account Number: [id number] Date of Birth/Sex: 28-Sep-1950 (69 y.o. F) Treating RN: Rodell Perna Primary Care Tika Hannis: Milinda Antis Other Clinician: Referring Deshundra Waller: Milinda Antis Treating Nattie Lazenby/Extender: Altamese [place] in Treatment: 0 Abuse/Suicide Risk Screen Items Answer ABUSE RISK SCREEN: Has anyone close to you tried to hurt or harm you recentlyo No Do you feel uncomfortable with anyone in your familyo No Has anyone forced you do things that you didnot want to doo No Electronic Signature(s) Signed: 11/02/2019 10:50:28 AM By: Rodell Perna Entered By: Rodell Perna on 11/02/2019 10:11:23 Susan [name] ([id number]) -------------------------------------------------------------------------------- Activities of Daily Living Details Patient Name: Susan [name] Date of Service: 11/02/2019 10:30 AM Medical Record Number: [id number] Patient Account Number: [id number] Date of Birth/Sex: 31-May-1950 (69 y.o. F) Treating RN: Rodell Perna Primary Care Rhaelyn Giron: Milinda Antis Other Clinician: Referring Tashi Andujo: Milinda Antis Treating Demitra Danley/Extender: Altamese [place] in Treatment: 0 Activities of Daily Living Items Answer Activities of Daily Living (Please select one for each item) Drive Automobile Completely Able Take Medications Completely Able Use Telephone Completely Able Care for Appearance Completely Able Use Toilet Completely Able Bath / Shower Completely Able Dress Self Completely Able Feed Self Completely Able Walk Completely Able Get In / Out Bed Completely Able Housework Completely Able Prepare Meals Completely Able Handle Money Completely Able Shop for Self Completely Able Electronic Signature(s) Signed: 11/02/2019 10:50:28 AM By: Rodell Perna Entered By:  Rodell Perna on 11/02/2019 10:11:36 Susan [name] ([id number]) -------------------------------------------------------------------------------- Education Screening Details Patient Name: Susan [name] Date of Service: 11/02/2019 10:30 AM Medical Record Number: [id number] Patient Account Number: [id number] Date of Birth/Sex: [birth year] (69 y.o. F) Treating RN: Rodell Perna Primary Care Carly Applegate: Milinda Antis Other Clinician: Referring Apple Dearmas: Milinda Antis Treating Chiante Peden/Extender: Altamese [place] in Treatment: 0 Primary Learner Assessed: Patient Learning Preferences/Education Level/Primary Language Learning Preference: Explanation Highest Education Level: High School Preferred Language: English Cognitive Barrier Language Barrier: No Translator Needed: No Memory Deficit: No Emotional Barrier: No Cultural/Religious Beliefs Affecting Medical Care: No Physical Barrier Impaired Vision: No Impaired Hearing: No Decreased Hand dexterity: No Knowledge/Comprehension Knowledge Level: High Comprehension Level: High Ability to understand written instructions: High Ability to understand verbal instructions: High Motivation Anxiety Level: Calm Cooperation: Cooperative Education Importance: Acknowledges Need Interest in Health Problems: Asks Questions Perception: Coherent Willingness to Engage in Self-Management High Activities: Readiness to Engage in Self-Management High Activities: Electronic Signature(s) Signed: 11/02/2019 10:50:28 AM By: Rodell Perna Entered By: Rodell Perna on 11/02/2019 10:11:53 Susan [name] ([id number]) -------------------------------------------------------------------------------- Fall Risk Assessment Details Patient Name: Susan [name] Date of Service: 11/02/2019 10:30 AM Medical Record Number: [id number] Patient Account Number: [id number] Date of Birth/Sex: 1950-08-07 (69 y.o. F) Treating RN: Rodell Perna Primary Care Kodee Ravert: Milinda Antis  Other Clinician: Referring Pristine Gladhill: Milinda Antis Treating Derron Pipkins/Extender: Altamese [place] in Treatment: 0 Fall Risk Assessment Items Have you had 2 or more falls in the last 12 monthso 0 No Have you had any fall that resulted in injury in the last 12 monthso 0 No FALLS RISK SCREEN History of falling - immediate or within 3 months 0 No Secondary diagnosis (Do you have 2 or more medical diagnoseso) 0 No Ambulatory aid None/bed rest/wheelchair/nurse 0 No Crutches/cane/walker 15 Yes Furniture 0 No Intravenous therapy Access/Saline/Heparin Lock 0 No Gait/Transferring Normal/ bed rest/ wheelchair 0 No Weak (short steps with or without shuffle, stooped but able to lift  head while walking, may 0 No seek support from furniture) Impaired (short steps with shuffle, may have difficulty arising from chair, head down, impaired 0 No balance) Mental Status Oriented to own ability 0 No Electronic Signature(s) Signed: 11/02/2019 10:50:28 AM By: Rodell Perna Entered By: Rodell Perna on 11/02/2019 10:12:00 Susan [name] ([id number]) -------------------------------------------------------------------------------- Nutrition Risk Screening Details Patient Name: Susan [name] Date of Service: 11/02/2019 10:30 AM Medical Record Number: [id number] Patient Account Number: [id number] Date of Birth/Sex: [birth year] (69 y.o. F) Treating RN: Rodell Perna Primary Care Yanet Balliet: Milinda Antis Other Clinician: Referring Korinne Greenstein: Milinda Antis Treating Danay Mckellar/Extender: Altamese [place] in Treatment: 0 Height (in): Weight (lbs): Body Mass Index (BMI): Nutrition Risk Screening Items Score Screening NUTRITION RISK SCREEN: I have an illness or condition that made me change the kind and/or amount of food I eat 0 No I eat fewer than two meals per day 0 No I eat few fruits and vegetables, or milk products 0 No I have three or more drinks of beer, liquor or wine almost every day 0 No I  have tooth or mouth problems that make it hard for me to eat 0 No I don't always have enough money to buy the food I need 0 No I eat alone most of the time 0 No I take three or more different prescribed or over-the-counter drugs a day 0 No Without wanting to, I have lost or gained 10 pounds in the last six months 0 No I am not always physically able to shop, cook and/or feed myself 0 No Nutrition Protocols Good Risk Protocol 0 No interventions needed Moderate Risk Protocol High Risk Proctocol Risk Level: Good Risk Score: 0 Electronic Signature(s) Signed: 11/02/2019 10:50:28 AM By: Rodell Perna Entered By: Rodell Perna on 11/02/2019 10:12:06

## 2019-11-02 NOTE — Progress Notes (Signed)
Granulation Amount: Medium (34-66%) Exposed Structure Granulation Quality: Red Fascia Exposed: No Necrotic Amount: Medium (34-66%) Fat Layer (Subcutaneous Tissue) Exposed: Yes Necrotic Quality: Adherent Slough Tendon Exposed: No Muscle Exposed: No Joint Exposed: No Bone Exposed: No Treatment Notes Wound #1 (Left, Medial Upper Leg) Notes silvercel, abd, kerlix, ace wrap Electronic Signature(s) Signed: 11/02/2019 10:50:28 AM By: Patrici Ranks, Susan [name] ([id number]) Entered By: Army Melia on 11/02/2019 10:14:43 Susan [name] ([id number]) -------------------------------------------------------------------------------- Vitals Details Patient Name: Susan [name] Date of Service: 11/02/2019 10:30 AM Medical Record Number: [id number] Patient Account Number: [id number] Date of Birth/Sex: 07-12-1950 (68 y.o. F) Treating RN: Army Melia Primary Care Sacoya Mcgourty: Vic Blackbird Other Clinician: Referring Tarance Balan: Vic Blackbird Treating Neytiri Asche/Extender: Tito Dine in Treatment: 0 Vital Signs Time Taken: 10:10 Temperature (F): 97.9 Height (in): 65 Pulse (bpm): 74 Source: Stated Respiratory Rate (breaths/min): 16 Weight (lbs): 271 Blood Pressure (mmHg): 162/64 Source: Stated Reference Range: 80 - 120 mg / dl Body Mass Index (BMI): 45.1 Electronic Signature(s) Signed: 11/02/2019 10:50:28 AM By: Army Melia Entered By: Army Melia on 11/02/2019 10:12:35  Montey Hora on 11/02/2019 10:26:53 Susan [name] ([id number]) -------------------------------------------------------------------------------- Encounter Discharge Information Details Patient Name: Susan [name] Date of Service: 11/02/2019 10:30 AM Medical Record Number: [id number] Patient Account Number: [id number] Date of Birth/Sex: [birth year] (69 y.o. F) Treating RN: Montey Hora Primary Care Taft Worthing: Vic Blackbird Other Clinician: Referring Olisa Quesnel: Vic Blackbird Treating Homer Pfeifer/Extender: Tito Dine in Treatment: 0 Encounter Discharge Information Items Post Procedure Vitals Discharge Condition: Stable Temperature (F): 97.9 Ambulatory Status: Cane Pulse (bpm): 74 Discharge Destination: Home Respiratory Rate (breaths/min): 16 Transportation: Private Auto Blood Pressure (mmHg): 162/64 Accompanied By: husband Schedule Follow-up Appointment: Yes Clinical Summary of Care: Electronic Signature(s) Signed: 11/02/2019 4:13:29 PM By: Montey Hora Entered By: Montey Hora on 11/02/2019 10:31:20 Susan [name] ([id number]) -------------------------------------------------------------------------------- Lower Extremity Assessment Details Patient Name: Susan [name] Date of Service: 11/02/2019 10:30 AM Medical Record Number: [id number] Patient Account Number: [id number] Date of Birth/Sex: 1950/09/27 (69 y.o. F) Treating RN: Army Melia Primary Care Harshini Trent: Vic Blackbird Other Clinician: Referring Jasha Hodzic: Vic Blackbird Treating Gwen Edler/Extender: Ricard Dillon Weeks in Treatment: 0 Electronic Signature(s) Signed: 11/02/2019 10:50:28 AM By: Army Melia Entered By: Army Melia on 11/02/2019 10:14:54 Susan [name] ([id number]) -------------------------------------------------------------------------------- Multi Wound Chart Details Patient Name: Susan [name] Date of Service: 11/02/2019 10:30 AM Medical Record Number: [id number] Patient Account Number: [id number] Date of Birth/Sex: 09-14-1950 (69 y.o. F) Treating RN: Montey Hora Primary Care Angelica Wix: Vic Blackbird Other Clinician: Referring Coraima Tibbs: Vic Blackbird Treating Jahnai Slingerland/Extender: Tito Dine in Treatment: 0 Vital Signs Height(in): 65 Pulse(bpm): 74 Weight(lbs): 271 Blood Pressure(mmHg): 162/64 Body Mass Index(BMI): 45 Temperature(F): 97.9 Respiratory Rate(breaths/min): 16 Photos: [N/A:N/A] Wound Location: Left, Medial Upper Leg N/A N/A Wounding Event: Blister N/A N/A Primary Etiology: Diabetic Wound/Ulcer of the Lower N/A N/A Extremity Comorbid History: Congestive Heart Failure, N/A N/A Hypertension, Myocardial Infarction, Type II Diabetes, Gout, Neuropathy Date Acquired: 10/16/2019 N/A N/A Weeks of Treatment: 0 N/A N/A Wound Status: Open N/A N/A Measurements L x W x D (cm) 3.9x4.5x0.1 N/A N/A Area (cm) : 13.784 N/A N/A Volume (cm) : 1.378 N/A N/A Classification: Grade 2 N/A N/A Exudate Amount: Medium N/A N/A Exudate Type: Serosanguineous N/A N/A Exudate Color: red, brown N/A N/A Wound Margin: Flat and Intact N/A N/A Granulation Amount: Medium (34-66%) N/A N/A Granulation Quality: Red N/A N/A Necrotic Amount: Medium (34-66%) N/A N/A Exposed Structures: Fat Layer (Subcutaneous Tissue) N/A N/A Exposed: Yes Fascia: No Tendon: No Muscle: No Joint: No Bone: No Epithelialization: None N/A N/A Debridement: Debridement - Excisional N/A N/A Pre-procedure Verification/Time 10:26 N/A N/A Out Taken: Pain Control: Lidocaine 4% Topical Solution N/A N/A Tissue Debrided: Subcutaneous, Slough N/A N/A Level: Skin/Subcutaneous Tissue N/A  N/A Debridement Area (sq cm): 17.55 N/A N/A Instrument: Curette N/A N/A Bleeding: Minimum N/A N/A Hemostasis Achieved: Pressure N/A N/A Procedural Pain: 0 N/A N/A Post Procedural Pain: 0 N/A N/A Debridement Treatment Procedure was tolerated well N/A N/A ResponseArmonii [name], Susan [name] ([id number]) Post Debridement 3.9x4.5x0.2 N/A N/A Measurements L x W x D (cm) Post Debridement Volume: 2.757 N/A N/A (cm) Procedures Performed: Debridement N/A N/A Treatment Notes Wound #1 (Left, Medial Upper Leg) Notes silvercel, abd, kerlix, ace wrap Electronic Signature(s) Signed: 11/02/2019 4:11:49 PM By: Linton Ham MD Entered By: Linton Ham on 11/02/2019 11:13:28 Susan [name] ([id number]) -------------------------------------------------------------------------------- Multi-Disciplinary Care Plan Details Patient Name: Susan [name] Date of Service: 11/02/2019 10:30 AM Medical Record Number: [id number] Patient Account Number: [id number] Date of Birth/Sex: March 08, 1951 (69 y.o. F) Treating RN: Montey Hora Primary Care Mataeo Ingwersen: Vic Blackbird Other Clinician: Referring Ethylene Reznick: Vic Blackbird Treating  Granulation Amount: Medium (34-66%) Exposed Structure Granulation Quality: Red Fascia Exposed: No Necrotic Amount: Medium (34-66%) Fat Layer (Subcutaneous Tissue) Exposed: Yes Necrotic Quality: Adherent Slough Tendon Exposed: No Muscle Exposed: No Joint Exposed: No Bone Exposed: No Treatment Notes Wound #1 (Left, Medial Upper Leg) Notes silvercel, abd, kerlix, ace wrap Electronic Signature(s) Signed: 11/02/2019 10:50:28 AM By: Patrici Ranks, Susan [name] ([id number]) Entered By: Army Melia on 11/02/2019 10:14:43 Susan [name] ([id number]) -------------------------------------------------------------------------------- Vitals Details Patient Name: Susan [name] Date of Service: 11/02/2019 10:30 AM Medical Record Number: [id number] Patient Account Number: [id number] Date of Birth/Sex: 07-12-1950 (68 y.o. F) Treating RN: Army Melia Primary Care Sacoya Mcgourty: Vic Blackbird Other Clinician: Referring Tarance Balan: Vic Blackbird Treating Neytiri Asche/Extender: Tito Dine in Treatment: 0 Vital Signs Time Taken: 10:10 Temperature (F): 97.9 Height (in): 65 Pulse (bpm): 74 Source: Stated Respiratory Rate (breaths/min): 16 Weight (lbs): 271 Blood Pressure (mmHg): 162/64 Source: Stated Reference Range: 80 - 120 mg / dl Body Mass Index (BMI): 45.1 Electronic Signature(s) Signed: 11/02/2019 10:50:28 AM By: Army Melia Entered By: Army Melia on 11/02/2019 10:12:35  Montey Hora on 11/02/2019 10:26:53 Susan [name] ([id number]) -------------------------------------------------------------------------------- Encounter Discharge Information Details Patient Name: Susan [name] Date of Service: 11/02/2019 10:30 AM Medical Record Number: [id number] Patient Account Number: [id number] Date of Birth/Sex: [birth year] (69 y.o. F) Treating RN: Montey Hora Primary Care Taft Worthing: Vic Blackbird Other Clinician: Referring Olisa Quesnel: Vic Blackbird Treating Homer Pfeifer/Extender: Tito Dine in Treatment: 0 Encounter Discharge Information Items Post Procedure Vitals Discharge Condition: Stable Temperature (F): 97.9 Ambulatory Status: Cane Pulse (bpm): 74 Discharge Destination: Home Respiratory Rate (breaths/min): 16 Transportation: Private Auto Blood Pressure (mmHg): 162/64 Accompanied By: husband Schedule Follow-up Appointment: Yes Clinical Summary of Care: Electronic Signature(s) Signed: 11/02/2019 4:13:29 PM By: Montey Hora Entered By: Montey Hora on 11/02/2019 10:31:20 Susan [name] ([id number]) -------------------------------------------------------------------------------- Lower Extremity Assessment Details Patient Name: Susan [name] Date of Service: 11/02/2019 10:30 AM Medical Record Number: [id number] Patient Account Number: [id number] Date of Birth/Sex: 1950/09/27 (69 y.o. F) Treating RN: Army Melia Primary Care Harshini Trent: Vic Blackbird Other Clinician: Referring Jasha Hodzic: Vic Blackbird Treating Gwen Edler/Extender: Ricard Dillon Weeks in Treatment: 0 Electronic Signature(s) Signed: 11/02/2019 10:50:28 AM By: Army Melia Entered By: Army Melia on 11/02/2019 10:14:54 Susan [name] ([id number]) -------------------------------------------------------------------------------- Multi Wound Chart Details Patient Name: Susan [name] Date of Service: 11/02/2019 10:30 AM Medical Record Number: [id number] Patient Account Number: [id number] Date of Birth/Sex: 09-14-1950 (69 y.o. F) Treating RN: Montey Hora Primary Care Angelica Wix: Vic Blackbird Other Clinician: Referring Coraima Tibbs: Vic Blackbird Treating Jahnai [place]/Extender: Tito Dine in Treatment: 0 Vital Signs Height(in): 65 Pulse(bpm): 74 Weight(lbs): 271 Blood Pressure(mmHg): 162/64 Body Mass Index(BMI): 45 Temperature(F): 97.9 Respiratory Rate(breaths/min): 16 Photos: [N/A:N/A] Wound Location: Left, Medial Upper Leg N/A N/A Wounding Event: Blister N/A N/A Primary Etiology: Diabetic Wound/Ulcer of the Lower N/A N/A Extremity Comorbid History: Congestive Heart Failure, N/A N/A Hypertension, Myocardial Infarction, Type II Diabetes, Gout, Neuropathy Date Acquired: 10/16/2019 N/A N/A Weeks of Treatment: 0 N/A N/A Wound Status: Open N/A N/A Measurements L x W x D (cm) 3.9x4.5x0.1 N/A N/A Area (cm) : 13.784 N/A N/A Volume (cm) : 1.378 N/A N/A Classification: Grade 2 N/A N/A Exudate Amount: Medium N/A N/A Exudate Type: Serosanguineous N/A N/A Exudate Color: red, brown N/A N/A Wound Margin: Flat and Intact N/A N/A Granulation Amount: Medium (34-66%) N/A N/A Granulation Quality: Red N/A N/A Necrotic Amount: Medium (34-66%) N/A N/A Exposed Structures: Fat Layer (Subcutaneous Tissue) N/A N/A Exposed: Yes Fascia: No Tendon: No Muscle: No Joint: No Bone: No Epithelialization: None N/A N/A Debridement: Debridement - Excisional N/A N/A Pre-procedure Verification/Time 10:26 N/A N/A Out Taken: Pain Control: Lidocaine 4% Topical Solution N/A N/A Tissue Debrided: Subcutaneous, Slough N/A N/A Level: Skin/Subcutaneous Tissue N/A  N/A Debridement Area (sq cm): 17.55 N/A N/A Instrument: Curette N/A N/A Bleeding: Minimum N/A N/A Hemostasis Achieved: Pressure N/A N/A Procedural Pain: 0 N/A N/A Post Procedural Pain: 0 N/A N/A Debridement Treatment Procedure was tolerated well N/A N/A ResponseArmonii [name], Susan [name] ([id number]) Post Debridement 3.9x4.5x0.2 N/A N/A Measurements L x W x D (cm) Post Debridement Volume: 2.757 N/A N/A (cm) Procedures Performed: Debridement N/A N/A Treatment Notes Wound #1 (Left, Medial Upper Leg) Notes silvercel, abd, kerlix, ace wrap Electronic Signature(s) Signed: 11/02/2019 4:11:49 PM By: Linton Ham MD Entered By: Linton Ham on 11/02/2019 11:13:28 Susan [name] ([id number]) -------------------------------------------------------------------------------- Multi-Disciplinary Care Plan Details Patient Name: Susan [name] Date of Service: 11/02/2019 10:30 AM Medical Record Number: [id number] Patient Account Number: [id number] Date of Birth/Sex: March 08, 1951 (69 y.o. F) Treating RN: Montey Hora Primary Care Mataeo Ingwersen: Vic Blackbird Other Clinician: Referring Ethylene Reznick: Vic Blackbird Treating

## 2019-11-04 NOTE — Progress Notes (Signed)
Susan [name], Susan [name] ([id number]) Visit Report for 11/02/2019 Chief Complaint Document Details Patient Name: Susan [name] Date of Service: 11/02/2019 10:30 AM Medical Record Number: [id number] Patient Account Number: [id number] Date of Birth/Sex: 08-10-1950 (69 y.o. F) Treating RN: Cornell Barman Primary Care Provider: Vic Blackbird Other Clinician: Referring Provider: Vic Blackbird Treating Provider/Extender: Tito Dine in Treatment: 0 Information Obtained from: Patient Chief Complaint 11/02/2019; patient is here for review of the wound on her left posterior medial distal thigh Electronic Signature(s) Signed: 11/02/2019 4:11:49 PM By: Linton Ham MD Entered By: Linton Ham on 11/02/2019 11:18:09 Susan [name] ([id number]) -------------------------------------------------------------------------------- Debridement Details Patient Name: Susan [name] Date of Service: 11/02/2019 10:30 AM Medical Record Number: [id number] Patient Account Number: [id number] Date of Birth/Sex: 1951-02-09 (69 y.o. F) Treating RN: Cornell Barman Primary Care Provider: Vic Blackbird Other Clinician: Referring Provider: Vic Blackbird Treating Provider/Extender: Tito Dine in Treatment: 0 Debridement Performed for Wound #1 Left,Medial Upper Leg Assessment: Performed By: Physician Ricard Dillon, MD Debridement Type: Debridement Severity of Tissue Pre Debridement: Fat layer exposed Level of Consciousness (Pre- Awake and Alert procedure): Pre-procedure Verification/Time Out Yes - 10:26 Taken: Start Time: 10:26 Pain Control: Lidocaine 4% Topical Solution Total Area Debrided (L x W): 3.9 (cm) x 4.5 (cm) = 17.55 (cm) Tissue and other material Viable, Non-Viable, Slough, Subcutaneous, Slough debrided: Level: Skin/Subcutaneous Tissue Debridement Description: Excisional Instrument: Curette Bleeding: Minimum Hemostasis Achieved: Pressure End Time: 10:29 Procedural Pain: 0 Post  Procedural Pain: 0 Response to Treatment: Procedure was tolerated well Level of Consciousness (Post- Awake and Alert procedure): Post Debridement Measurements of Total Wound Length: (cm) 3.9 Width: (cm) 4.5 Depth: (cm) 0.2 Volume: (cm) 2.757 Character of Wound/Ulcer Post Debridement: Improved Severity of Tissue Post Debridement: Fat layer exposed Post Procedure Diagnosis Same as Pre-procedure Electronic Signature(s) Signed: 11/02/2019 4:11:49 PM By: Linton Ham MD Signed: 11/04/2019 12:58:54 PM By: [name], BSN, RN, CWS, Kim RN, BSN Entered By: Linton Ham on 11/02/2019 11:15:33 Susan [name] ([id number]) -------------------------------------------------------------------------------- HPI Details Patient Name: Susan [name] Date of Service: 11/02/2019 10:30 AM Medical Record Number: [id number] Patient Account Number: [id number] Date of Birth/Sex: April 27, 1951 (69 y.o. F) Treating RN: Cornell Barman Primary Care Provider: Vic Blackbird Other Clinician: Referring Provider: Vic Blackbird Treating Provider/Extender: Tito Dine in Treatment: 0 History of Present Illness HPI Description: ADMISSION 11/02/2019 This is a 69 year old woman who is a type II diabetic on insulin. She also has a history of chronic venous insufficiency in her lower extremities for which she is seeing Dr. Sharol Given in the past and wears compression stockings for recurrent edema and blistering in her lower extremities. She has done well. She feels that the lower extremity stockings push fluid up into her thighs and recently in the last 3 weeks she developed blister on her left posterior medial thigh which is opened into a wound. She was seen by her nephrologist apparently started on doxycycline. Seen her primary doctor yesterday who is Dr. [name] and put on Bactrim 1 tablet daily. She also has chronic renal failure stage IV and a history of diastolic congestive heart failure. She takes reasonably high doses  of Demadex apparently. Past medical history; diastolic congestive heart failure, chronic kidney disease stage IV most recent creatinine ICU was 1.91, chronic venous insufficiency, osteoarthritis, coronary artery disease, hypothyroidism and type 2 diabetes on insulin with the most recent hemoglobin A1c we have available at 5.7 We did not do our standard ABI in our clinic because the wound was on her thigh however looking back  through [facility] link she did have arterial studies in 2019 at which time the ABI on the right was 1.21 with biphasic waveforms and a TBI of 0.72 on the left her ABI was 1.21 again with biphasic waveforms and a TBI of 0.65 at least in 2019 her blood flow seemed fairly normal. Electronic Signature(s) Signed: 11/02/2019 4:11:49 PM By: Linton Ham MD Entered By: Linton Ham on 11/02/2019 11:22:13 Susan [name] ([id number]) -------------------------------------------------------------------------------- Physical Exam Details Patient Name: Susan [name] Date of Service: 11/02/2019 10:30 AM Medical Record Number: [id number] Patient Account Number: [id number] Date of Birth/Sex: 01/07/1951 (69 y.o. F) Treating RN: Cornell Barman Primary Care Provider: Vic Blackbird Other Clinician: Referring Provider: Vic Blackbird Treating Provider/Extender: Tito Dine in Treatment: 0 Constitutional Patient is hypertensive.. Pulse regular and within target range for patient.Marland Kitchen Respirations regular, non-labored and within target range.. Temperature is normal and within the target range for the patient.Marland Kitchen appears in no distress. Respiratory Respiratory effort is easy and symmetric bilaterally. Rate is normal at rest and on room air.. Bilateral breath sounds are clear and equal in all lobes with no wheezes, rales or rhonchi.. Cardiovascular Heart rhythm and rate regular, without murmur or gallop. No elevation of JVPand no coccyx edema. Pedal pulses are palpable. Patient has  chronic venous changes in her lower extremities but her edema is well controlled. Integumentary (Hair, Skin) Skin changes of chronic with hemosiderin deposition. Psychiatric No evidence of depression, anxiety, or agitation. Calm, cooperative, and communicative. Appropriate interactions and affect.. Notes Wound the area in question is on the left medial posterior thigh just above the knee. Wound itself is superficial. There is some erythema around that although I think any degree of cellulitis here has been properly treated by other physicians. There is no surrounding tenderness. There is debris on the surface of the wound which was adherent. I used an open curette to remove this. Hemostasis with direct pressure. oExamination of her left leg was most relevant for the amount of swelling she has in her posterior thigh which is nonpitting. Electronic Signature(s) Signed: 11/02/2019 4:11:49 PM By: Linton Ham MD Entered By: Linton Ham on 11/02/2019 11:27:08 Susan [name] ([id number]) -------------------------------------------------------------------------------- Physician Orders Details Patient Name: Susan [name] Date of Service: 11/02/2019 10:30 AM Medical Record Number: [id number] Patient Account Number: [id number] Date of Birth/Sex: 09-Jan-1951 (69 y.o. F) Treating RN: Montey Hora Primary Care Provider: Vic Blackbird Other Clinician: Referring Provider: Vic Blackbird Treating Provider/Extender: Tito Dine in Treatment: 0 Verbal / Phone Orders: No Diagnosis Coding Wound Cleansing Wound #1 Left,Medial Upper Leg o Clean wound with Normal Saline. o May Shower, gently pat wound dry prior to applying new dressing. Anesthetic (add to Medication List) Wound #1 Left,Medial Upper Leg o Topical Lidocaine 4% cream applied to wound bed prior to debridement (In Clinic Only). Primary Wound Dressing Wound #1 Left,Medial Upper Leg o Silver Alginate Secondary  Dressing Wound #1 Left,Medial Upper Leg o ABD and Kerlix/Conform Dressing Change Frequency Wound #1 Left,Medial Upper Leg o Change dressing every day. Follow-up Appointments o Return Appointment in 1 week. Edema Control Wound #1 Left,Medial Upper Leg o Patient to wear own compression stockings o Elevate legs to the level of the heart and pump ankles as often as possible o Other: - ACE wrap to upper leg above compression socks on lower leg Services and Therapies o Venous Studies -Bilateral Electronic Signature(s) Signed: 11/02/2019 4:11:49 PM By: Linton Ham MD Signed: 11/02/2019 4:13:29 PM By: Montey Hora Entered By: Montey Hora on 11/02/2019 10:29:36  Susan [name], Susan [name] ([id number]) -------------------------------------------------------------------------------- Problem List Details Patient Name: Susan [name], Susan [name] Date of Service: 11/02/2019 10:30 AM Medical Record Number: [id number] Patient Account Number: [id number] Date of Birth/Sex: [birth year] (69 y.o. 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 11/02/2019 No Yes L03.116 Cellulitis of left lower limb 11/02/2019 No Yes I50.30 Unspecified diastolic (congestive) heart failure 11/02/2019 No Yes E11.622 Type 2 diabetes mellitus with other skin ulcer 11/02/2019 No Yes Inactive Problems Resolved Problems Electronic Signature(s) Signed: 11/02/2019 4:11:49 PM By: Linton Ham MD Entered By: Linton Ham on 11/02/2019 10:46:09 Susan [name]  ([id number]) -------------------------------------------------------------------------------- Progress Note Details Patient Name: Susan [name] Date of Service: 11/02/2019 10:30 AM Medical Record Number: [id number] Patient Account Number: [id number] Date of Birth/Sex: 08-Dec-1950 (69 y.o. F) Treating RN: Cornell Barman Primary Care Provider: Vic Blackbird Other Clinician: Referring Provider: Vic Blackbird Treating Provider/Extender: Tito Dine in Treatment: 0 Subjective Chief Complaint Information obtained from Patient 11/02/2019; patient is here for review of the wound on her left posterior medial distal thigh History of Present Illness (HPI) ADMISSION 11/02/2019 This is a 69 year old woman who is a type II diabetic on insulin. She also has a history of chronic venous insufficiency in her lower extremities for which she is seeing Dr. Sharol Given in the past and wears compression stockings for recurrent edema and blistering in her lower extremities. She has done well. She feels that the lower extremity stockings push fluid up into her thighs and recently in the last 3 weeks she developed blister on her left posterior medial thigh which is opened into a wound. She was seen by her nephrologist apparently started on doxycycline. Seen her primary doctor yesterday who is Dr. [name] and put on Bactrim 1 tablet daily. She also has chronic renal failure stage IV and a history of diastolic congestive heart failure. She takes reasonably high doses of Demadex apparently. Past medical history; diastolic congestive heart failure, chronic kidney disease stage IV most recent creatinine ICU was 1.91, chronic venous insufficiency, osteoarthritis, coronary artery disease, hypothyroidism and type 2 diabetes on insulin with the most recent hemoglobin A1c we have available at 5.7 We did not do our standard ABI in our clinic because the wound was on her thigh however looking back through [facility] health link she did  have arterial studies in 2019 at which time the ABI on the right was 1.21 with biphasic waveforms and a TBI of 0.72 on the left her ABI was 1.21 again with biphasic waveforms and a TBI of 0.65 at least in 2019 her blood flow seemed fairly normal. Patient History Information obtained from Patient. Allergies codeine (Reaction: nausea and vomiting) Family History Diabetes - Maternal Grandparents, Heart Disease - Mother,Maternal Grandparents, Hypertension - Maternal Grandparents, Lung Disease - Maternal Grandparents, No family history of Cancer, Hereditary Spherocytosis, Kidney Disease, Seizures, Stroke, Thyroid Problems, Tuberculosis. Social History Former smoker - 25 years, Marital Status - Married, Alcohol Use - Rarely, Drug Use - No History, Caffeine Use - Daily. Medical History Eyes Denies history of Cataracts, Glaucoma, Optic Neuritis Ear/Nose/Mouth/Throat Denies history of Chronic sinus problems/congestion, Middle ear problems Hematologic/Lymphatic Denies history of Anemia, Hemophilia, Human Immunodeficiency Virus, Lymphedema,  Sickle Cell Disease Respiratory Denies history of Aspiration, Asthma, Chronic Obstructive Pulmonary Disease (COPD), Pneumothorax, Sleep Apnea, Tuberculosis Cardiovascular Patient has history of Congestive Heart Failure, Hypertension, Myocardial Infarction Denies history of Angina, Arrhythmia, Coronary Artery Disease, Deep Vein Thrombosis, Hypotension, Peripheral Arterial Disease, Peripheral Venous Disease, Phlebitis, Vasculitis Gastrointestinal Denies history of Cirrhosis , Colitis, Crohn s, Hepatitis A, Hepatitis B, Hepatitis C Endocrine Patient has history of Type II Diabetes - 15 years Genitourinary Denies history of End Stage Renal Disease Immunological Denies history of Lupus Erythematosus, Raynaud s, Scleroderma Integumentary (Skin) Denies history of History of Burn Musculoskeletal Patient has history of Gout Denies history of Rheumatoid  Arthritis, Osteoarthritis, Osteomyelitis Susan [name], Susan [name] ([id number]) Neurologic Patient has history of Neuropathy Denies history of Dementia, Quadriplegia, Paraplegia, Seizure Disorder Oncologic Denies history of Received Chemotherapy, Received Radiation Psychiatric Denies history of Anorexia/bulimia, Confinement Anxiety Patient is treated with Insulin. Blood sugar is not tested. Hospitalization/Surgery History - [facility] 07/2019. Review of Systems (ROS) Constitutional Symptoms (General Health) Denies complaints or symptoms of Fatigue, Fever, Chills, Marked Weight Change. Eyes Denies complaints or symptoms of Dry Eyes, Vision Changes, Glasses / Contacts. Ear/Nose/Mouth/Throat Denies complaints or symptoms of Difficult clearing ears, Sinusitis. Hematologic/Lymphatic Denies complaints or symptoms of Bleeding / Clotting Disorders, Human Immunodeficiency Virus. Respiratory Denies complaints or symptoms of Chronic or frequent coughs, Shortness of Breath. Cardiovascular Denies complaints or symptoms of Chest pain, LE edema. Gastrointestinal Denies complaints or symptoms of Frequent diarrhea, Nausea, Vomiting. Endocrine Denies complaints or symptoms of Hepatitis, Thyroid disease, Polydypsia (Excessive Thirst). Genitourinary Denies complaints or symptoms of Kidney failure/ Dialysis, Incontinence/dribbling. Immunological Denies complaints or symptoms of Hives, Itching. Integumentary (Skin) Complains or has symptoms of Wounds. Denies complaints or symptoms of Bleeding or bruising tendency, Breakdown, Swelling. Musculoskeletal Denies complaints or symptoms of Muscle Pain, Muscle Weakness. Neurologic Denies complaints or symptoms of Numbness/parasthesias, Focal/Weakness. Psychiatric Denies complaints or symptoms of Anxiety, Claustrophobia. Objective Constitutional Patient is hypertensive.. Pulse regular and within target range for patient.Marland Kitchen Respirations regular, non-labored and within target  range.. Temperature is normal and within the target range for the patient.Marland Kitchen appears in no distress. Vitals Time Taken: 10:10 AM, Height: 65 in, Source: Stated, Weight: 271 lbs, Source: Stated, BMI: 45.1, Temperature: 97.9 F, Pulse: 74 bpm, Respiratory Rate: 16 breaths/min, Blood Pressure: 162/64 mmHg. Respiratory Respiratory effort is easy and symmetric bilaterally. Rate is normal at rest and on room air.. Bilateral breath sounds are clear and equal in all lobes with no wheezes, rales or rhonchi.. Cardiovascular Heart rhythm and rate regular, without murmur or gallop. No elevation of JVPand no coccyx edema. Pedal pulses are palpable. Patient has chronic venous changes in her lower extremities but her edema is well controlled. Psychiatric No evidence of depression, anxiety, or agitation. Calm, cooperative, and communicative. Appropriate interactions and affect.. General Notes: Wound the area in question is on the left medial posterior thigh just above the knee. Wound itself is superficial. There is some erythema around that although I think any degree of cellulitis here has been properly treated by other physicians. There is no surrounding tenderness. There is debris on the surface of the wound which was adherent. I used an open curette to remove this. Hemostasis with direct pressure. Examination of her left leg was most relevant for the amount of swelling she has in her posterior thigh which is nonpitting. Susan [name], Susan [name] ([id number]) Integumentary (Hair, Skin) Skin changes of chronic with hemosiderin deposition. Wound #1 status is Open. Original cause of wound was Blister. The wound is located on the  Left,Medial Upper Leg. The wound measures 3.9cm length x 4.5cm width x 0.1cm depth; 13.784cm^2 area and 1.378cm^3 volume. There is Fat Layer (Subcutaneous Tissue) Exposed exposed. There is no tunneling or undermining noted. There is a medium amount of serosanguineous drainage noted. The wound margin  is flat and intact. There is medium (34-66%) red granulation within the wound bed. There is a medium (34-66%) amount of necrotic tissue within the wound bed including Adherent Slough. 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 There was a Excisional Skin/Subcutaneous Tissue Debridement with a total area of 17.55 sq cm performed by Ricard Dillon, MD. With the following instrument(s): Curette to remove Viable and Non-Viable tissue/material. Material removed includes Subcutaneous Tissue and Slough and after achieving pain control using Lidocaine 4% Topical Solution. No specimens were taken. A time out was conducted at 10:26, prior to the start of the procedure. A Minimum amount of bleeding was controlled with Pressure. The procedure was tolerated well with a pain level of 0 throughout and a pain level of 0 following the procedure. Post Debridement Measurements: 3.9cm length x 4.5cm width x 0.2cm depth; 2.757cm^3 volume. Character of Wound/Ulcer Post Debridement is improved. Severity of Tissue Post Debridement is: Fat layer exposed. Post procedure Diagnosis Wound #1: Same as Pre-Procedure Plan Wound Cleansing: Wound #1 Left,Medial Upper Leg: Clean wound with Normal Saline. May Shower, gently pat wound dry prior to applying new dressing. Anesthetic (add to Medication List): Wound #1 Left,Medial Upper Leg: Topical Lidocaine 4% cream applied to wound bed prior to debridement (In  Clinic Only). Primary Wound Dressing: Wound #1 Left,Medial Upper Leg: Silver Alginate Secondary Dressing: Wound #1 Left,Medial Upper Leg: ABD and Kerlix/Conform Dressing Change Frequency: Wound #1 Left,Medial Upper Leg: Change dressing every day. Follow-up Appointments: Return Appointment in 1 week. Edema Control: Wound #1 Left,Medial Upper Leg: Patient to wear own compression stockings Elevate legs to the level of the heart and pump ankles as often as possible Other: - ACE wrap to upper leg above compression socks on lower leg Services and Therapies ordered were: Venous Studies -Bilateral Square, Severina ([id number]) 1. Patient with a wound on her posterior left thigh. The wound itself required debridement however any degree of cellulitis seems to be well controlled. 2. Based on her arterial studies in 2019 and bedside exam I do not think there is a significant arterial issue 3. She has significant edema in her posterior thighs left greater than right which is nonpitting. I wondered about whether she has a central cause of chronic venous hypertension. I think she should have formal venous reflux studies bilaterally. May ultimately need to see vascular I wonder whether she some form of venogram although she could not have contrast 4. Based on bedside exam once again I did not see any evidence of congestive heart failure. 5. We put silver alginate on the wound ABDs Kerlix and conform. We will Ace wrap the area to see if we can get some edema away from the wound. The patient was cautioned not to put excessive pressure in this area either while sitting or in  bed. 6. I think we should be able to get this wound to close however I am concerned about the amount of edema she has in the left leg. I do not think this is all because of her compression stockings in her lower legs I spent 45 minutes in review of this patient's past medical history, face-to-face evaluation and preparation of this  record Electronic Signature(s) Signed: 11/02/2019 4:11:49 PM By: Linton Ham MD Entered By: Linton Ham on 11/02/2019 11:30:54 Susan [name] ([id number]) -------------------------------------------------------------------------------- ROS/PFSH Details Patient Name: Susan [name] Date of Service: 11/02/2019 10:30 AM Medical Record Number: [id number] Patient Account Number: [id number] Date of Birth/Sex: Nov 04, 1950 (69 y.o. F) Treating RN: Army Melia Primary Care Provider: Vic Blackbird Other Clinician: Referring Provider: Vic Blackbird Treating Provider/Extender: Tito Dine in Treatment: 0 Information Obtained From Patient Constitutional Symptoms (General Health) Complaints and Symptoms: Negative for: Fatigue; Fever; Chills; Marked Weight Change Eyes Complaints and Symptoms: Negative for: Dry Eyes; Vision Changes; Glasses / Contacts Medical History: Negative for: Cataracts; Glaucoma; Optic Neuritis Ear/Nose/Mouth/Throat Complaints and Symptoms: Negative for: Difficult clearing ears; Sinusitis Medical History: Negative for: Chronic sinus problems/congestion; Middle ear problems Hematologic/Lymphatic Complaints and Symptoms: Negative for: Bleeding / Clotting Disorders; Human Immunodeficiency Virus Medical History: Negative for: Anemia; Hemophilia; Human Immunodeficiency Virus; Lymphedema; Sickle Cell Disease Respiratory Complaints and Symptoms: Negative for: Chronic or frequent coughs; Shortness of Breath Medical History: Negative for: Aspiration; Asthma; Chronic Obstructive Pulmonary Disease (COPD); Pneumothorax; Sleep Apnea; Tuberculosis Cardiovascular Complaints and Symptoms: Negative for: Chest pain; LE edema Medical History: Positive for: Congestive Heart Failure; Hypertension; Myocardial Infarction Negative for: Angina; Arrhythmia; Coronary Artery Disease; Deep Vein Thrombosis; Hypotension; Peripheral Arterial Disease; Peripheral Venous Disease;  Phlebitis; Vasculitis Gastrointestinal Complaints and Symptoms: Negative for: Frequent diarrhea; Nausea; Vomiting Medical History: Negative for: Cirrhosis ; Colitis; Crohnos; Hepatitis A; Hepatitis B; Hepatitis C Endocrine Susan [name], Susan [name] ([id number]) Complaints and Symptoms: Negative for: Hepatitis; Thyroid disease; Polydypsia (Excessive Thirst) Medical History: Positive for: Type II Diabetes - 15 years Treated with: Insulin Blood sugar tested every day: No Genitourinary Complaints and Symptoms: Negative for: Kidney failure/ Dialysis; Incontinence/dribbling Medical History: Negative for: End Stage Renal Disease Immunological Complaints and Symptoms: Negative for: Hives; Itching Medical History: Negative for: Lupus Erythematosus; Raynaudos; Scleroderma Integumentary (Skin) Complaints and Symptoms: Positive for: Wounds Negative for: Bleeding or bruising tendency; Breakdown; Swelling Medical History: Negative for: History of Burn Musculoskeletal Complaints and Symptoms: Negative for: Muscle Pain; Muscle Weakness Medical History: Positive for: Gout Negative for: Rheumatoid Arthritis; Osteoarthritis; Osteomyelitis Neurologic Complaints and Symptoms: Negative for: Numbness/parasthesias; Focal/Weakness Medical History: Positive for: Neuropathy Negative for: Dementia; Quadriplegia; Paraplegia; Seizure Disorder Psychiatric Complaints and Symptoms: Negative for: Anxiety; Claustrophobia Medical History: Negative for: Anorexia/bulimia; Confinement Anxiety Oncologic Medical History: Negative for: Received Chemotherapy; Received Radiation Immunizations Pneumococcal Vaccine: Received Pneumococcal Vaccination: Yes Implantable Devices Susan [name], Susan [name] ([id number]) None Hospitalization / Surgery History Type of Hospitalization/Surgery [facility] 07/2019 Family and Social History Cancer: No; Diabetes: Yes - Maternal Grandparents; Heart Disease: Yes - Mother,Maternal Grandparents;  Hereditary Spherocytosis: No; Hypertension: Yes - Maternal Grandparents; Kidney Disease: No; Lung Disease: Yes - Maternal Grandparents; Seizures: No; Stroke: No; Thyroid Problems: No; Tuberculosis: No; Former smoker - 25 years; Marital Status - Married; Alcohol Use: Rarely; Drug Use: No History; Caffeine Use: Daily; Financial Concerns: No; Food, Clothing or Shelter Needs: No; Support System Lacking: No; Transportation Concerns: No Electronic Signature(s) Signed: 11/02/2019 10:50:28 AM By: Army Melia Signed: 11/02/2019 4:11:49 PM By: Linton Ham MD Entered By: Army Melia on 11/02/2019 10:11:15 Susan [name] ([id number]) --------------------------------------------------------------------------------  SuperBill Details Patient Name: Susan [name], Susan [name] Date of Service: 11/02/2019 Medical Record Number: [id number] Patient Account Number: [id number] Date of Birth/Sex: [birth year] (69 y.o. 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 L03.116 Cellulitis of left lower limb I50.30 Unspecified diastolic (congestive) heart failure E11.622 Type 2 diabetes mellitus with other skin ulcer Facility Procedures CPT4 Code: 59292446 Description: 28638 - WOUND CARE VISIT-LEV 3 EST PT Modifier: Quantity: 1 CPT4 Code: 17711657 Description: 90383 - DEB SUBQ TISSUE 20 SQ CM/< Modifier: Quantity: 1 CPT4 Code: Description: ICD-10 Diagnosis Description F38.329 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 Modifier: Quantity: Physician Procedures CPT4 Code Description: 1916606 00459  - WC PHYS LEVEL 4 - NEW PT Modifier: 25 Quantity: 1 CPT4 Code Description: ICD-10 Diagnosis Description X77.414 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

## 2019-11-08 ENCOUNTER — Telehealth (HOSPITAL_COMMUNITY): Payer: Self-pay | Admitting: *Deleted

## 2019-11-08 NOTE — Telephone Encounter (Signed)
Pt left vm to schedule f/u appt. I called pt back no answer/left vm to return call.

## 2019-11-09 ENCOUNTER — Encounter: Payer: PPO | Admitting: Internal Medicine

## 2019-11-09 ENCOUNTER — Other Ambulatory Visit: Payer: Self-pay

## 2019-11-09 DIAGNOSIS — L97122 Non-pressure chronic ulcer of left thigh with fat layer exposed: Secondary | ICD-10-CM | POA: Diagnosis not present

## 2019-11-09 DIAGNOSIS — I87332 Chronic venous hypertension (idiopathic) with ulcer and inflammation of left lower extremity: Secondary | ICD-10-CM | POA: Diagnosis not present

## 2019-11-09 DIAGNOSIS — L03116 Cellulitis of left lower limb: Secondary | ICD-10-CM | POA: Diagnosis not present

## 2019-11-09 DIAGNOSIS — E11622 Type 2 diabetes mellitus with other skin ulcer: Secondary | ICD-10-CM | POA: Diagnosis not present

## 2019-11-09 NOTE — Progress Notes (Signed)
Susan [name], Susan [name] ([id number]) Visit Report for 11/09/2019 HPI Details Patient Name: Susan [name], Susan [name] Date of Service: 11/09/2019 12:30 PM Medical Record Number: [id number] Patient Account Number: [id number] Date of Birth/Sex: Sep 27, 1950 (69 y.o. F) Treating RN: Cornell Barman Primary Care Provider: Vic Blackbird Other Clinician: Referring Provider: Vic Blackbird Treating Provider/Extender: Tito Dine in Treatment: 1 History of Present Illness HPI Description: ADMISSION 11/02/2019 This is a 69 year old woman who is a type II diabetic on insulin. She also has a history of chronic venous insufficiency in her lower extremities for which she is seeing Dr. Sharol Given in the past and wears compression stockings for recurrent edema and blistering in her lower extremities. She has done well. She feels that the lower extremity stockings push fluid up into her thighs and recently in the last 3 weeks she developed blister on her left posterior medial thigh which is opened into a wound. She was seen by her nephrologist apparently started on doxycycline. Seen her primary doctor yesterday who is Dr. [name] and put on Bactrim 1 tablet daily. She also has chronic renal failure stage IV and a history of diastolic congestive heart failure. She takes reasonably high doses of Demadex apparently. Past medical history; diastolic congestive heart failure, chronic kidney disease stage IV most recent creatinine ICU was 1.91, chronic venous insufficiency, osteoarthritis, coronary artery disease, hypothyroidism and type 2 diabetes on insulin with the most recent hemoglobin A1c we have available at 5.7 We did not do our standard ABI in our clinic because the wound was on her thigh however looking back through [facility] health link she did have arterial studies in 2019 at which time the ABI on the right was 1.21 with biphasic waveforms and a TBI of 0.72 on the left her ABI was 1.21 again with biphasic waveforms and a TBI of 0.65  at least in 2019 her blood flow seemed fairly normal. 6/16; patient has reflux studies on 6/21. Her husband states the dressing did not stay on very easily on her medial thigh however he purchased something that sounds like ABD pads and they eventually did stay in place. We have been using silver alginate. She did also develop a new wound on the posterior lateral thigh. not really sure how this happened. Electronic Signature(s) Signed: 11/09/2019 4:26:25 PM By: Linton Ham MD Entered By: Linton Ham on 11/09/2019 13:08:33 Susan [name] ([id number]) -------------------------------------------------------------------------------- Physical Exam Details Patient Name: Susan [name] Date of Service: 11/09/2019 12:30 PM Medical Record Number: [id number] Patient Account Number: [id number] Date of Birth/Sex: [birth year] (69 y.o. F) Treating RN: Cornell Barman Primary Care Provider: Vic Blackbird Other Clinician: Referring Provider: Vic Blackbird Treating Provider/Extender: Ricard Dillon Weeks in Treatment: 1 Constitutional Sitting or standing Blood Pressure is within target range for patient.. Pulse regular and within target range for patient.Marland Kitchen Respirations regular, non- labored and within target range.. Temperature is normal and within the target range for the patient.Marland Kitchen appears in no distress. Respiratory Respiratory effort is easy and symmetric bilaterally. Rate is normal at rest and on room air.. Integumentary (Hair, Skin) she appears to be constantly scratching at her legs. Notes Wound exam; the area in question on the left medial posterior thigh is now down to 3 small open areas. These are superficial did not require debridement. HOWEVER comes in with a new superficial wound on the left posterior lateral thigh of uncertain etiology. This is happened since last week. She continues to have left greater than right thigh edema nonpitting. Skin changes of chronic venous  insufficiency. Electronic  Signature(s) Signed: 11/09/2019 4:26:25 PM By: Linton Ham MD Entered By: Linton Ham on 11/09/2019 13:10:38 Susan [name] ([id number]) -------------------------------------------------------------------------------- Physician Orders Details Patient Name: Susan [name] Date of Service: 11/09/2019 12:30 PM Medical Record Number: [id number] Patient Account Number: [id number] Date of Birth/Sex: 05-02-1951 (69 y.o. F) Treating RN: Cornell Barman Primary Care Provider: Vic Blackbird Other Clinician: Referring Provider: Vic Blackbird Treating Provider/Extender: Tito Dine in Treatment: 1 Verbal / Phone Orders: No Diagnosis Coding Wound Cleansing Wound #1 Left,Medial Upper Leg o Clean wound with Normal Saline. o May Shower, gently pat wound dry prior to applying new dressing. Wound #2 Left,Lateral Upper Leg o Clean wound with Normal Saline. o May Shower, gently pat wound dry prior to applying new dressing. Anesthetic (add to Medication List) Wound #1 Left,Medial Upper Leg o Topical Lidocaine 4% cream applied to wound bed prior to debridement (In Clinic Only). Wound #2 Left,Lateral Upper Leg o Topical Lidocaine 4% cream applied to wound bed prior to debridement (In Clinic Only). Primary Wound Dressing Wound #1 Left,Medial Upper Leg o Silver Alginate Wound #2 Left,Lateral Upper Leg o Silver Alginate Secondary Dressing Wound #1 Left,Medial Upper Leg o Boardered Foam Dressing Wound #2 Left,Lateral Upper Leg o Boardered Foam Dressing Dressing Change Frequency Wound #1 Left,Medial Upper Leg o Change dressing every day. Follow-up Appointments o Return Appointment in 1 week. Edema Control Wound #1 Left,Medial Upper Leg o Patient to wear own compression stockings o Elevate legs to the level of the heart and pump ankles as often as possible o Other: - ACE wrap to upper leg above compression socks on lower  leg Electronic Signature(s) Signed: 11/09/2019 4:26:25 PM By: Linton Ham MD Signed: 11/09/2019 5:04:05 PM By: [name], BSN, RN, CWS, Kim RN, BSN Entered By: [name], BSN, RN, CWS, Kim on 11/09/2019 13:01:33 Susan [name] ([id number]) -------------------------------------------------------------------------------- Problem List Details Patient Name: Susan [name] Date of Service: 11/09/2019 12:30 PM Medical Record Number: [id number] Patient Account Number: [id number] Date of Birth/Sex: [birth year] (69 y.o. 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 11/02/2019 No Yes L03.116 Cellulitis of left lower limb 11/02/2019 No Yes I50.30 Unspecified diastolic (congestive) heart failure 11/02/2019 No Yes E11.622 Type 2 diabetes mellitus with other skin ulcer 11/02/2019 No Yes Inactive Problems Resolved Problems Electronic Signature(s) Signed: 11/09/2019 4:26:25 PM By: Linton Ham MD Entered By: Linton Ham on 11/09/2019 13:06:49 Susan [name] ([id number]) -------------------------------------------------------------------------------- Progress Note Details Patient Name: Susan [name] Date of Service: 11/09/2019 12:30 PM Medical Record Number: [id number] Patient Account Number: [id number] Date of Birth/Sex: 12/05/1950 (69 y.o. F) Treating RN: Cornell Barman Primary Care Provider: Vic Blackbird Other Clinician: Referring Provider: Vic Blackbird Treating Provider/Extender: Tito Dine in Treatment: 1 Subjective History  of Present Illness (HPI) ADMISSION 11/02/2019 This is a 69 year old woman who is a type II diabetic on insulin. She also has a history of chronic venous insufficiency in her lower extremities for which she is seeing Dr. Sharol Given in the past and wears compression stockings for recurrent edema and blistering in her lower extremities. She has done well. She feels that the lower extremity stockings push fluid up into her thighs and recently in the  last 3 weeks she developed blister on her left posterior medial thigh which is opened into a wound. She was seen by her nephrologist apparently started on doxycycline. Seen her primary doctor yesterday who is Dr. [name] and put on Bactrim 1 tablet daily. She also has chronic renal failure stage IV and a history of diastolic congestive heart failure. She takes reasonably high doses of Demadex apparently. Past medical history; diastolic congestive heart failure, chronic kidney disease stage IV most recent creatinine ICU was 1.91, chronic venous insufficiency, osteoarthritis, coronary artery disease, hypothyroidism and type 2 diabetes on insulin with the most recent hemoglobin A1c we have available at 5.7 We did not do our standard ABI in our clinic because the wound was on her thigh however looking back through [facility] health link she did have arterial studies in 2019 at which time the ABI on the right was 1.21 with biphasic waveforms and a TBI of 0.72 on the left her ABI was 1.21 again with biphasic waveforms and a TBI of 0.65 at least in 2019 her blood flow seemed fairly normal. 6/16; patient has reflux studies on 6/21. Her husband states the dressing did not stay on very easily on her medial thigh however he purchased something that sounds like ABD pads and they eventually did stay in place. We have been using silver alginate. She did also develop a new wound on the posterior lateral thigh. not really sure how this happened. Objective Constitutional Sitting or  standing Blood Pressure is within target range for patient.. Pulse regular and within target range for patient.Marland Kitchen Respirations regular, non- labored and within target range.. Temperature is normal and within the target range for the patient.Marland Kitchen appears in no distress. Vitals Time Taken: 12:40 PM, Height: 65 in, Weight: 271 lbs, BMI: 45.1, Temperature: 98.0 F, Pulse: 68 bpm, Respiratory Rate: 18 breaths/min, Blood Pressure: 135/54 mmHg. Respiratory Respiratory effort is easy and symmetric bilaterally. Rate is normal at rest and on room air.. General Notes: Wound exam; the area in question on the left medial posterior thigh is now down to 3 small open areas. These are superficial did not require debridement. HOWEVER comes in with a new superficial wound on the left posterior lateral thigh of uncertain etiology. This is happened since last week. She continues to have left greater than right thigh edema nonpitting. Skin changes of chronic venous insufficiency. Integumentary (Hair, Skin) she appears to be constantly scratching at her legs. Wound #1 status is Open. Original cause of wound was Blister. The wound is located on the Left,Medial Upper Leg. The wound measures 2.8cm length x 1.6cm width x 0.1cm depth; 3.519cm^2 area and 0.352cm^3 volume. There is Fat Layer (Subcutaneous Tissue) Exposed exposed. There is no tunneling or undermining noted. There is a medium amount of serosanguineous drainage noted. The wound margin is flat and intact. There is large (67-100%) red granulation within the wound bed. There is a small (1-33%) amount of necrotic tissue within the wound bed including Adherent Slough. Wound #2 status is Open. Original cause of wound was Trauma. The wound is located on the Left,Lateral Upper Leg. The wound measures 1.1cm length x 3.2cm width x 0.1cm depth; 2.765cm^2 area and 0.276cm^3 volume. There is Fat Layer (Subcutaneous Tissue) Exposed exposed. There is no tunneling or undermining  noted. There is a medium amount of serous drainage noted. The wound margin is flat and intact. There is large (67-100%) pink granulation within the wound bed. There is no necrotic tissue within the wound bed. 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 May Shower, gently pat wound dry prior to applying new dressing. Wound #2 Left,Lateral Upper Leg: Clean wound with Normal Saline. May Shower, gently pat wound dry prior to applying new dressing. Anesthetic (add to Medication List): Wound #1 Left,Medial Upper Leg: Topical Lidocaine 4% cream applied to wound bed prior to debridement (In Clinic Only). Wound #2 Left,Lateral Upper Leg: Topical Lidocaine 4% cream applied to wound bed prior to debridement (In Clinic Only). Primary Wound Dressing: Wound #1 Left,Medial Upper Leg: Silver Alginate Wound #2 Left,Lateral Upper Leg: Silver Alginate Secondary Dressing: Wound #1 Left,Medial Upper Leg: Boardered Foam Dressing Wound #2 Left,Lateral Upper Leg: Boardered Foam Dressing Dressing Change Frequency: Wound #1 Left,Medial Upper Leg: Change dressing every day. Follow-up Appointments: Return Appointment in 1 week. Edema Control: Wound #1 Left,Medial Upper Leg: Patient to wear own compression stockings Elevate legs to the level of the heart and pump ankles as often as possible Other: - ACE wrap to upper leg above compression socks on lower leg 1. Silver alginate to both wound areas ABDs under an Ace wrap change daily 2. Her original wound looks a lot better than  last week and is now down to 3 small open areas. This may be closed by next week however she has a new area on the left posterior lateral thigh 3. We will look at her venous reflux studies next week. We should exclude any chronic DVT that could be contributing to swelling in her left leg. I almost wonder whether we need need to look at her central venous outflow tract. I may send her to vascular surgery for their opinion on this 4. Ultimately she may need thigh-high stockings Electronic Signature(s) Signed: 11/09/2019 4:26:25 PM By: Linton Ham MD Entered By: Linton Ham on 11/09/2019 13:12:49 Susan [name] ([id number]) -------------------------------------------------------------------------------- SuperBill Details Patient Name: Susan [name] Date of Service: 11/09/2019 Medical Record Number: [id number] Patient Account Number: [id number] Date of Birth/Sex: [birth year] (69 y.o. 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 L03.116 Cellulitis of left lower limb I50.30 Unspecified diastolic (congestive) heart failure E11.622 Type 2 diabetes mellitus with other skin ulcer Facility Procedures CPT4 Code: [id number] Description: 99213 - WOUND CARE VISIT-LEV 3 EST PT Modifier: Quantity: 1 Physician Procedures CPT4 Code Description: [id number] 86754 - WC PHYS LEVEL 3 - EST PT Modifier: Quantity: 1 CPT4 Code Description: ICD-10 Diagnosis Description I87.332 Chronic venous hypertension (idiopathic) with ulcer and inflammation of I89.0 Lymphedema, not elsewhere classified L97.121  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

## 2019-11-09 NOTE — Progress Notes (Signed)
[name], BSN, RN, CWS, Kim RN, BSN Entered By: [name], BSN, RN, CWS, Kim on 11/09/2019 13:03:02 Susan [name] ([id number]) -------------------------------------------------------------------------------- Wound Assessment Details Patient Name: Susan [name] Date of Service: 11/09/2019 12:30 PM Medical Record Number: [id number] Patient Account Number: [id number] Date of Birth/Sex: [birth year] (69 y.o. F) Treating RN: Montey Hora Primary Care Juliana Boling: Vic Blackbird Other  Clinician: Referring Strider Vallance: Vic Blackbird Treating Mykenzi Vanzile/Extender: Tito Dine in Treatment: 1 Wound Status Wound Number: 1 Primary Diabetic Wound/Ulcer of the Lower Extremity Etiology: Wound Location: Left, Medial Upper Leg Wound Open Wounding Event: Blister Status: Date Acquired: 10/16/2019 Comorbid Congestive Heart Failure, Hypertension, Myocardial Weeks Of Treatment: 1 History: Infarction, Type II Diabetes, Gout, Neuropathy Clustered Wound: No Photos Wound Measurements Length: (cm) 2.8 Width: (cm) 1.6 Depth: (cm) 0.1 Area: (cm) 3.519 Volume: (cm) 0.352 % Reduction in Area: 74.5% % Reduction in Volume: 74.5% Epithelialization: Small (1-33%) Tunneling: No Undermining: No Wound Description Classification: Grade 2 Fou Wound Margin: Flat and Intact Slo Exudate Amount: Medium Exudate Type: Serosanguineous Exudate Color: red, brown l Odor After Cleansing: No ugh/Fibrino Yes Wound Bed Granulation Amount: Large (67-100%) Exposed Structure Granulation Quality: Red Fascia Exposed: No Necrotic Amount: Small (1-33%) Fat Layer (Subcutaneous Tissue) Exposed: Yes Necrotic Quality: Adherent Slough Tendon Exposed: No Muscle Exposed: No Joint Exposed: No Bone Exposed: No Treatment Notes Wound #1 (Left, Medial Upper Leg) Notes silvercel, abd, kerlix, ace wrap Electronic Signature(s) Signed: 11/09/2019 4:35:33 PM By: Glendell Docker, [name] ([id number]) Entered By: Montey Hora on 11/09/2019 12:49:45 Susan [name] ([id number]) -------------------------------------------------------------------------------- Wound Assessment Details Patient Name: Susan [name] Date of Service: 11/09/2019 12:30 PM Medical Record Number: [id number] Patient Account Number: [id number] Date of Birth/Sex: Jul 18, 1950 (69 y.o. F) Treating RN: Montey Hora Primary Care Evian Derringer: Vic Blackbird Other Clinician: Referring Reza Crymes: Vic Blackbird Treating  Anjoli Diemer/Extender: Tito Dine in Treatment: 1 Wound Status Wound Number: 2 Primary Skin Tear Etiology: Wound Location: Left, Lateral Upper Leg Wound Open Wounding Event: Trauma Status: Date Acquired: 11/07/2019 Comorbid Congestive Heart Failure, Hypertension, Myocardial Weeks Of Treatment: 0 History: Infarction, Type II Diabetes, Gout, Neuropathy Clustered Wound: No Photos Wound Measurements Length: (cm) 1.1 Width: (cm) 3.2 Depth: (cm) 0.1 Area: (cm) 2.765 Volume: (cm) 0.276 % Reduction in Area: % Reduction in Volume: Epithelialization: None Tunneling: No Undermining: No Wound Description Classification: Full Thickness Without Exposed Support Structures Wound Margin: Flat and Intact Exudate Amount: Medium Exudate Type: Serous Exudate Color: amber Foul Odor After Cleansing: No Slough/Fibrino No Wound Bed Granulation Amount: Large (67-100%) Exposed Structure Granulation Quality: Pink Fascia Exposed: No Necrotic Amount: None Present (0%) Fat Layer (Subcutaneous Tissue) Exposed: Yes Tendon Exposed: No Muscle Exposed: No Joint Exposed: No Bone Exposed: No Treatment Notes Wound #2 (Left, Lateral Upper Leg) Notes silvercel, abd, kerlix, ace wrap Electronic Signature(s) Signed: 11/09/2019 4:35:33 PM By: Glendell Docker, [name] ([id number]) Entered By: Montey Hora on 11/09/2019 12:51:42 Susan [name] ([id number]) -------------------------------------------------------------------------------- Vitals Details Patient Name: Susan [name] Date of Service: 11/09/2019 12:30 PM Medical Record Number: [id number] Patient Account Number: [id number] Date of Birth/Sex: 02/28/1951 (69 y.o. F) Treating RN: Cornell Barman Primary Care Aniello Christopoulos: Vic Blackbird Other Clinician: Referring Ninoska Goswick: Vic Blackbird Treating Zariah Jost/Extender: Tito Dine in Treatment: 1 Vital Signs Time Taken: 12:40 Temperature (F): 98.0 Height (in): 65 Pulse (bpm):  68 Weight (lbs): 271 Respiratory Rate (breaths/min): 18 Body Mass Index (BMI): 45.1 Blood Pressure (mmHg): 135/54 Reference Range: 80 - 120 mg / dl Electronic Signature(s) Signed: 11/09/2019 4:31:33 PM By: Becky Sax, Sallie RCP, RRT, CHT Entered  any number of wounds) []  - 0 Wound Tracing (instead of photographs) []  - 0 Simple Wound Measurement - one wound X- 2 5 Complex Wound Measurement - multiple wounds INTERVENTIONS - Wound Dressings []  - Small Wound Dressing one or multiple wounds 0 X- 2 15 Medium Wound Dressing one or multiple wounds []  - 0 Large Wound Dressing one or multiple wounds []  - 0 Application of Medications - topical []  - 0 Application of Medications - injection INTERVENTIONS - Miscellaneous []  - External ear exam 0 []  - 0 Specimen Collection (cultures, biopsies, blood, body fluids, etc.) []  - 0 Specimen(s) / Culture(s) sent or taken to Lab for analysis []  - 0 Patient Transfer (multiple staff / [employer] fast streamer / Similar devices) []  - 0 Simple Staple / Suture removal (25 or less) []  - 0 Complex Staple / Suture removal (26 or more) []  - 0 Hypo / Hyperglycemic Management (close monitor of Blood Glucose) []  - 0 Ankle / Brachial Index (ABI) - do not check if billed separately X- 1 5 Vital Signs Has the patient been seen at the hospital within the last three years: Yes Total Score: 105 Level Of Care: New/Established - Level 3 Electronic Signature(s) Signed: 11/09/2019 5:04:05 PM By: [name], BSN, RN, CWS, Kim RN, BSN Entered By: [name], BSN, RN, CWS, Kim on 11/09/2019 13:02:37 Susan [name] ([id number]) -------------------------------------------------------------------------------- Encounter Discharge Information Details Patient Name: Susan [name] Date of Service: 11/09/2019 12:30 PM Medical Record Number: [id number] Patient Account Number: [id number] Date of Birth/Sex: 1950-08-08 (69 y.o. F) Treating RN: Cornell Barman Primary Care Passion Lavin: Vic Blackbird Other Clinician: Referring Austan Nicholl: Vic Blackbird Treating Ranesha Val/Extender: Tito Dine in Treatment: 1 Encounter Discharge Information Items Discharge Condition: Stable Ambulatory Status: Ambulatory Discharge Destination: Home Transportation: Private Auto Accompanied By: husband Schedule Follow-up Appointment: Yes Clinical Summary of Care: Electronic Signature(s) Signed: 11/09/2019 5:04:05 PM By: [name], BSN, RN, CWS, Kim RN, BSN Entered By: [name], BSN, RN, CWS, Kim on 11/09/2019 13:03:54 Susan [name] ([id number]) -------------------------------------------------------------------------------- Lower Extremity Assessment Details Patient Name: Susan [name] Date of Service: 11/09/2019 12:30 PM Medical Record Number: [id number] Patient Account Number: [id number] Date of Birth/Sex: [birth year] (69 y.o. F) Treating RN: Montey Hora Primary Care Dawanna Grauberger: Vic Blackbird Other Clinician: Referring Aikam Hellickson: Vic Blackbird Treating Lorrain Rivers/Extender: Ricard Dillon Weeks in Treatment: 1 Electronic Signature(s) Signed: 11/09/2019 4:35:33 PM By: Montey Hora Entered By: Montey Hora on 11/09/2019 12:46:55 Susan [name] ([id number]) -------------------------------------------------------------------------------- Multi Wound Chart Details Patient Name: Susan [name] Date of Service: 11/09/2019 12:30 PM Medical Record Number: [id number] Patient Account Number: [id number] Date of Birth/Sex: [birth year] (70 y.o. 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 N/A Extremity Comorbid History: Congestive Heart Failure, Congestive Heart Failure, N/A Hypertension, Myocardial Infarction, Hypertension, Myocardial Infarction, Type II Diabetes, Gout, Neuropathy Type II Diabetes, Gout, Neuropathy Date Acquired: 10/16/2019 11/07/2019 N/A Weeks of Treatment: 1 0 N/A Wound Status: Open Open N/A Measurements L x W x D (cm) 2.8x1.6x0.1 1.1x3.2x0.1 N/A Area (cm) : 3.519 2.765 N/A Volume (cm) : 0.352 0.276 N/A % Reduction in Area: 74.50% N/A N/A % Reduction in Volume: 74.50% N/A N/A Classification: Grade 2 Full Thickness Without  any number of wounds) []  - 0 Wound Tracing (instead of photographs) []  - 0 Simple Wound Measurement - one wound X- 2 5 Complex Wound Measurement - multiple wounds INTERVENTIONS - Wound Dressings []  - Small Wound Dressing one or multiple wounds 0 X- 2 15 Medium Wound Dressing one or multiple wounds []  - 0 Large Wound Dressing one or multiple wounds []  - 0 Application of Medications - topical []  - 0 Application of Medications - injection INTERVENTIONS - Miscellaneous []  - External ear exam 0 []  - 0 Specimen Collection (cultures, biopsies, blood, body fluids, etc.) []  - 0 Specimen(s) / Culture(s) sent or taken to Lab for analysis []  - 0 Patient Transfer (multiple staff / [employer] fast streamer / Similar devices) []  - 0 Simple Staple / Suture removal (25 or less) []  - 0 Complex Staple / Suture removal (26 or more) []  - 0 Hypo / Hyperglycemic Management (close monitor of Blood Glucose) []  - 0 Ankle / Brachial Index (ABI) - do not check if billed separately X- 1 5 Vital Signs Has the patient been seen at the hospital within the last three years: Yes Total Score: 105 Level Of Care: New/Established - Level 3 Electronic Signature(s) Signed: 11/09/2019 5:04:05 PM By: [name], BSN, RN, CWS, Kim RN, BSN Entered By: [name], BSN, RN, CWS, Kim on 11/09/2019 13:02:37 Susan [name] ([id number]) -------------------------------------------------------------------------------- Encounter Discharge Information Details Patient Name: Susan [name] Date of Service: 11/09/2019 12:30 PM Medical Record Number: [id number] Patient Account Number: [id number] Date of Birth/Sex: 1950-08-08 (69 y.o. F) Treating RN: Cornell Barman Primary Care Passion Lavin: Vic Blackbird Other Clinician: Referring Austan Nicholl: Vic Blackbird Treating Ranesha Val/Extender: Tito Dine in Treatment: 1 Encounter Discharge Information Items Discharge Condition: Stable Ambulatory Status: Ambulatory Discharge Destination: Home Transportation: Private Auto Accompanied By: husband Schedule Follow-up Appointment: Yes Clinical Summary of Care: Electronic Signature(s) Signed: 11/09/2019 5:04:05 PM By: [name], BSN, RN, CWS, Kim RN, BSN Entered By: [name], BSN, RN, CWS, Kim on 11/09/2019 13:03:54 Susan [name] ([id number]) -------------------------------------------------------------------------------- Lower Extremity Assessment Details Patient Name: Susan [name] Date of Service: 11/09/2019 12:30 PM Medical Record Number: [id number] Patient Account Number: [id number] Date of Birth/Sex: [birth year] (69 y.o. F) Treating RN: Montey Hora Primary Care Dawanna Grauberger: Vic Blackbird Other Clinician: Referring Aikam Hellickson: Vic Blackbird Treating Lorrain Rivers/Extender: Ricard Dillon Weeks in Treatment: 1 Electronic Signature(s) Signed: 11/09/2019 4:35:33 PM By: Montey Hora Entered By: Montey Hora on 11/09/2019 12:46:55 Susan [name] ([id number]) -------------------------------------------------------------------------------- Multi Wound Chart Details Patient Name: Susan [name] Date of Service: 11/09/2019 12:30 PM Medical Record Number: [id number] Patient Account Number: [id number] Date of Birth/Sex: [birth year] (70 y.o. 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 N/A Extremity Comorbid History: Congestive Heart Failure, Congestive Heart Failure, N/A Hypertension, Myocardial Infarction, Hypertension, Myocardial Infarction, Type II Diabetes, Gout, Neuropathy Type II Diabetes, Gout, Neuropathy Date Acquired: 10/16/2019 11/07/2019 N/A Weeks of Treatment: 1 0 N/A Wound Status: Open Open N/A Measurements L x W x D (cm) 2.8x1.6x0.1 1.1x3.2x0.1 N/A Area (cm) : 3.519 2.765 N/A Volume (cm) : 0.352 0.276 N/A % Reduction in Area: 74.50% N/A N/A % Reduction in Volume: 74.50% N/A N/A Classification: Grade 2 Full Thickness Without  any number of wounds) []  - 0 Wound Tracing (instead of photographs) []  - 0 Simple Wound Measurement - one wound X- 2 5 Complex Wound Measurement - multiple wounds INTERVENTIONS - Wound Dressings []  - Small Wound Dressing one or multiple wounds 0 X- 2 15 Medium Wound Dressing one or multiple wounds []  - 0 Large Wound Dressing one or multiple wounds []  - 0 Application of Medications - topical []  - 0 Application of Medications - injection INTERVENTIONS - Miscellaneous []  - External ear exam 0 []  - 0 Specimen Collection (cultures, biopsies, blood, body fluids, etc.) []  - 0 Specimen(s) / Culture(s) sent or taken to Lab for analysis []  - 0 Patient Transfer (multiple staff / [employer] fast streamer / Similar devices) []  - 0 Simple Staple / Suture removal (25 or less) []  - 0 Complex Staple / Suture removal (26 or more) []  - 0 Hypo / Hyperglycemic Management (close monitor of Blood Glucose) []  - 0 Ankle / Brachial Index (ABI) - do not check if billed separately X- 1 5 Vital Signs Has the patient been seen at the hospital within the last three years: Yes Total Score: 105 Level Of Care: New/Established - Level 3 Electronic Signature(s) Signed: 11/09/2019 5:04:05 PM By: [name], BSN, RN, CWS, Kim RN, BSN Entered By: [name], BSN, RN, CWS, Kim on 11/09/2019 13:02:37 Susan [name] ([id number]) -------------------------------------------------------------------------------- Encounter Discharge Information Details Patient Name: Susan [name] Date of Service: 11/09/2019 12:30 PM Medical Record Number: [id number] Patient Account Number: [id number] Date of Birth/Sex: 1950-08-08 (69 y.o. F) Treating RN: Cornell Barman Primary Care Passion Lavin: Vic Blackbird Other Clinician: Referring Austan Nicholl: Vic Blackbird Treating Ranesha Val/Extender: Tito Dine in Treatment: 1 Encounter Discharge Information Items Discharge Condition: Stable Ambulatory Status: Ambulatory Discharge Destination: Home Transportation: Private Auto Accompanied By: husband Schedule Follow-up Appointment: Yes Clinical Summary of Care: Electronic Signature(s) Signed: 11/09/2019 5:04:05 PM By: [name], BSN, RN, CWS, Kim RN, BSN Entered By: [name], BSN, RN, CWS, Kim on 11/09/2019 13:03:54 Susan [name] ([id number]) -------------------------------------------------------------------------------- Lower Extremity Assessment Details Patient Name: Susan [name] Date of Service: 11/09/2019 12:30 PM Medical Record Number: [id number] Patient Account Number: [id number] Date of Birth/Sex: [birth year] (69 y.o. F) Treating RN: Montey Hora Primary Care Dawanna Grauberger: Vic Blackbird Other Clinician: Referring Aikam Hellickson: Vic Blackbird Treating Lorrain Rivers/Extender: Ricard Dillon Weeks in Treatment: 1 Electronic Signature(s) Signed: 11/09/2019 4:35:33 PM By: Montey Hora Entered By: Montey Hora on 11/09/2019 12:46:55 Susan [name] ([id number]) -------------------------------------------------------------------------------- Multi Wound Chart Details Patient Name: Susan [name] Date of Service: 11/09/2019 12:30 PM Medical Record Number: [id number] Patient Account Number: [id number] Date of Birth/Sex: [birth year] (70 y.o. 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 N/A Extremity Comorbid History: Congestive Heart Failure, Congestive Heart Failure, N/A Hypertension, Myocardial Infarction, Hypertension, Myocardial Infarction, Type II Diabetes, Gout, Neuropathy Type II Diabetes, Gout, Neuropathy Date Acquired: 10/16/2019 11/07/2019 N/A Weeks of Treatment: 1 0 N/A Wound Status: Open Open N/A Measurements L x W x D (cm) 2.8x1.6x0.1 1.1x3.2x0.1 N/A Area (cm) : 3.519 2.765 N/A Volume (cm) : 0.352 0.276 N/A % Reduction in Area: 74.50% N/A N/A % Reduction in Volume: 74.50% N/A N/A Classification: Grade 2 Full Thickness Without  any number of wounds) []  - 0 Wound Tracing (instead of photographs) []  - 0 Simple Wound Measurement - one wound X- 2 5 Complex Wound Measurement - multiple wounds INTERVENTIONS - Wound Dressings []  - Small Wound Dressing one or multiple wounds 0 X- 2 15 Medium Wound Dressing one or multiple wounds []  - 0 Large Wound Dressing one or multiple wounds []  - 0 Application of Medications - topical []  - 0 Application of Medications - injection INTERVENTIONS - Miscellaneous []  - External ear exam 0 []  - 0 Specimen Collection (cultures, biopsies, blood, body fluids, etc.) []  - 0 Specimen(s) / Culture(s) sent or taken to Lab for analysis []  - 0 Patient Transfer (multiple staff / [employer] fast streamer / Similar devices) []  - 0 Simple Staple / Suture removal (25 or less) []  - 0 Complex Staple / Suture removal (26 or more) []  - 0 Hypo / Hyperglycemic Management (close monitor of Blood Glucose) []  - 0 Ankle / Brachial Index (ABI) - do not check if billed separately X- 1 5 Vital Signs Has the patient been seen at the hospital within the last three years: Yes Total Score: 105 Level Of Care: New/Established - Level 3 Electronic Signature(s) Signed: 11/09/2019 5:04:05 PM By: [name], BSN, RN, CWS, Kim RN, BSN Entered By: [name], BSN, RN, CWS, Kim on 11/09/2019 13:02:37 Susan [name] ([id number]) -------------------------------------------------------------------------------- Encounter Discharge Information Details Patient Name: Susan [name] Date of Service: 11/09/2019 12:30 PM Medical Record Number: [id number] Patient Account Number: [id number] Date of Birth/Sex: 1950-08-08 (69 y.o. F) Treating RN: Cornell Barman Primary Care Passion Lavin: Vic Blackbird Other Clinician: Referring Austan Nicholl: Vic Blackbird Treating Ranesha Val/Extender: Tito Dine in Treatment: 1 Encounter Discharge Information Items Discharge Condition: Stable Ambulatory Status: Ambulatory Discharge Destination: Home Transportation: Private Auto Accompanied By: husband Schedule Follow-up Appointment: Yes Clinical Summary of Care: Electronic Signature(s) Signed: 11/09/2019 5:04:05 PM By: [name], BSN, RN, CWS, Kim RN, BSN Entered By: [name], BSN, RN, CWS, Kim on 11/09/2019 13:03:54 Susan [name] ([id number]) -------------------------------------------------------------------------------- Lower Extremity Assessment Details Patient Name: Susan [name] Date of Service: 11/09/2019 12:30 PM Medical Record Number: [id number] Patient Account Number: [id number] Date of Birth/Sex: [birth year] (69 y.o. F) Treating RN: Montey Hora Primary Care Dawanna Grauberger: Vic Blackbird Other Clinician: Referring Aikam Hellickson: Vic Blackbird Treating Lorrain Rivers/Extender: Ricard Dillon Weeks in Treatment: 1 Electronic Signature(s) Signed: 11/09/2019 4:35:33 PM By: Montey Hora Entered By: Montey Hora on 11/09/2019 12:46:55 Susan [name] ([id number]) -------------------------------------------------------------------------------- Multi Wound Chart Details Patient Name: Susan [name] Date of Service: 11/09/2019 12:30 PM Medical Record Number: [id number] Patient Account Number: [id number] Date of Birth/Sex: [birth year] (70 y.o. 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

## 2019-11-10 DIAGNOSIS — I871 Compression of vein: Secondary | ICD-10-CM | POA: Diagnosis not present

## 2019-11-10 DIAGNOSIS — E11319 Type 2 diabetes mellitus with unspecified diabetic retinopathy without macular edema: Secondary | ICD-10-CM | POA: Diagnosis not present

## 2019-11-10 DIAGNOSIS — Z9641 Presence of insulin pump (external) (internal): Secondary | ICD-10-CM | POA: Diagnosis not present

## 2019-11-10 DIAGNOSIS — R809 Proteinuria, unspecified: Secondary | ICD-10-CM | POA: Diagnosis not present

## 2019-11-10 DIAGNOSIS — N189 Chronic kidney disease, unspecified: Secondary | ICD-10-CM | POA: Diagnosis not present

## 2019-11-10 DIAGNOSIS — E039 Hypothyroidism, unspecified: Secondary | ICD-10-CM | POA: Diagnosis not present

## 2019-11-10 DIAGNOSIS — E1165 Type 2 diabetes mellitus with hyperglycemia: Secondary | ICD-10-CM | POA: Diagnosis not present

## 2019-11-10 DIAGNOSIS — R748 Abnormal levels of other serum enzymes: Secondary | ICD-10-CM | POA: Diagnosis not present

## 2019-11-10 DIAGNOSIS — I10 Essential (primary) hypertension: Secondary | ICD-10-CM | POA: Diagnosis not present

## 2019-11-10 DIAGNOSIS — E11621 Type 2 diabetes mellitus with foot ulcer: Secondary | ICD-10-CM | POA: Diagnosis not present

## 2019-11-10 DIAGNOSIS — G609 Hereditary and idiopathic neuropathy, unspecified: Secondary | ICD-10-CM | POA: Diagnosis not present

## 2019-11-11 ENCOUNTER — Other Ambulatory Visit (HOSPITAL_COMMUNITY): Payer: Self-pay | Admitting: Internal Medicine

## 2019-11-11 DIAGNOSIS — L97929 Non-pressure chronic ulcer of unspecified part of left lower leg with unspecified severity: Secondary | ICD-10-CM

## 2019-11-11 DIAGNOSIS — I87332 Chronic venous hypertension (idiopathic) with ulcer and inflammation of left lower extremity: Secondary | ICD-10-CM

## 2019-11-11 DIAGNOSIS — I890 Lymphedema, not elsewhere classified: Secondary | ICD-10-CM

## 2019-11-14 ENCOUNTER — Other Ambulatory Visit: Payer: Self-pay

## 2019-11-14 ENCOUNTER — Ambulatory Visit (HOSPITAL_COMMUNITY)
Admission: RE | Admit: 2019-11-14 | Discharge: 2019-11-14 | Disposition: A | Discharge status: Home / Self Care | Payer: PPO | Source: Ambulatory Visit | Attending: Cardiovascular Disease | Admitting: Cardiovascular Disease

## 2019-11-14 DIAGNOSIS — I87332 Chronic venous hypertension (idiopathic) with ulcer and inflammation of left lower extremity: Secondary | ICD-10-CM | POA: Insufficient documentation

## 2019-11-14 DIAGNOSIS — L97929 Non-pressure chronic ulcer of unspecified part of left lower leg with unspecified severity: Secondary | ICD-10-CM | POA: Insufficient documentation

## 2019-11-14 DIAGNOSIS — I890 Lymphedema, not elsewhere classified: Secondary | ICD-10-CM | POA: Diagnosis not present

## 2019-11-14 DIAGNOSIS — I871 Compression of vein: Secondary | ICD-10-CM | POA: Diagnosis not present

## 2019-11-16 ENCOUNTER — Encounter: Payer: PPO | Admitting: Internal Medicine

## 2019-11-16 ENCOUNTER — Other Ambulatory Visit: Payer: Self-pay

## 2019-11-16 DIAGNOSIS — I87332 Chronic venous hypertension (idiopathic) with ulcer and inflammation of left lower extremity: Secondary | ICD-10-CM | POA: Diagnosis not present

## 2019-11-16 DIAGNOSIS — L97121 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: Secondary | ICD-10-CM | POA: Diagnosis not present

## 2019-11-16 DIAGNOSIS — L03116 Cellulitis of left lower limb: Secondary | ICD-10-CM | POA: Diagnosis not present

## 2019-11-16 DIAGNOSIS — H43392 Other vitreous opacities, left eye: Secondary | ICD-10-CM | POA: Diagnosis not present

## 2019-11-16 DIAGNOSIS — H43813 Vitreous degeneration, bilateral: Secondary | ICD-10-CM | POA: Diagnosis not present

## 2019-11-16 DIAGNOSIS — E11622 Type 2 diabetes mellitus with other skin ulcer: Secondary | ICD-10-CM | POA: Diagnosis not present

## 2019-11-16 DIAGNOSIS — E113513 Type 2 diabetes mellitus with proliferative diabetic retinopathy with macular edema, bilateral: Secondary | ICD-10-CM | POA: Diagnosis not present

## 2019-11-16 NOTE — Progress Notes (Signed)
SEQUOYA, HOGSETT ([id number]) Visit Report for 11/16/2019 HPI Details Patient Name: Susan [name], Susan [name] Date of Service: 11/16/2019 9:45 AM Medical Record Number: [id number] Patient Account Number: [id number] Date of Birth/Sex: March 05, 1951 (69 y.o. F) Treating RN: Cornell Barman Primary Care Provider: Vic Blackbird Other Clinician: Referring Provider: Vic Blackbird Treating Provider/Extender: Tito Dine in Treatment: 2 History of Present Illness HPI Description: ADMISSION 11/02/2019 This is a 69 year old woman who is a type II diabetic on insulin. She also has a history of chronic venous insufficiency in her lower extremities for which she is seeing Dr. Sharol Given in the past and wears compression stockings for recurrent edema and blistering in her lower extremities. She has done well. She feels that the lower extremity stockings push fluid up into her thighs and recently in the last 3 weeks she developed blister on her left posterior medial thigh which is opened into a wound. She was seen by her nephrologist apparently started on doxycycline. Seen her primary doctor yesterday who is Dr. [name] and put on Bactrim 1 tablet daily. She also has chronic renal failure stage IV and a history of diastolic congestive heart failure. She takes reasonably high doses of Demadex apparently. Past medical history; diastolic congestive heart failure, chronic kidney disease stage IV most recent creatinine ICU was 1.91, chronic venous insufficiency, osteoarthritis, coronary artery disease, hypothyroidism and type 2 diabetes on insulin with the most recent hemoglobin A1c we have available at 5.7 We did not do our standard ABI in our clinic because the wound was on her thigh however looking back through [facility] health link she did have arterial studies in 2019 at which time the ABI on the right was 1.21 with biphasic waveforms and a TBI of 0.72 on the left her ABI was 1.21 again with biphasic waveforms and a TBI of 0.65 at  least in 2019 her blood flow seemed fairly normal. 6/16; patient has reflux studies on 6/21. Her husband states the dressing did not stay on very easily on her medial thigh however he purchased something that sounds like ABD pads and they eventually did stay in place. We have been using silver alginate. She did also develop a new wound on the posterior lateral thigh. not really sure how this happened. 6/23; the patient went for venous studies but seems to have ended up but heart and vascular in [place]. She did not have evidence of a DVT on either side. Noted to have a cystic structure in the popliteal fossa although I am not exactly sure what that is it was avascular question Baker's cyst. They recommended follow-up for a venous reflux study with vein and vascular although that is what we thought we were sending her for. The patient has closed the wound on the medial thigh which is the wound she came in with. She developed another wound last week on the lateral thigh that is very superficial and should be closed by next week. Patient has followed with Dr. Sharol Given for bilateral lower extremity stockings and venous disease. She has an enlarged left thigh versus the right thigh. I think she needs formal venous reflux studies and consideration of central venous obstruction on the left. I am going to refer her to vein and vascular to see the surgeon in consultation with regards to both of the latter issues. If she does not have venous issues either correctable reflux or central venous disease and I think she is going to need thigh-high compression on the left side Electronic Signature(s) Signed: 11/16/2019 3:45:53  PM By: Linton Ham MD Entered By: Linton Ham on 11/16/2019 10:09:39 Susan [name] ([id number]) -------------------------------------------------------------------------------- Physical Exam Details Patient Name: Susan [name] Date of Service: 11/16/2019 9:45 AM Medical Record Number:  [id number] Patient Account Number: [id number] Date of Birth/Sex: Oct 11, 1950 (69 y.o. F) Treating RN: Cornell Barman Primary Care Provider: Vic Blackbird Other Clinician: Referring Provider: Vic Blackbird Treating Provider/Extender: Tito Dine in Treatment: 2 Constitutional Patient is hypertensive.. Pulse regular and within target range for patient.Marland Kitchen Respirations regular, non-labored and within target range.. Temperature is normal and within the target range for the patient.Marland Kitchen appears in no distress. Cardiovascular Pedal pulses are palpable. Notes Wound exam; hearing questions on the left medial posterior thigh this is a very clean superficial wound and may be closed by next week. The area on the medial part of her left thigh is epithelialized although there is continued inflammation in this area likely related to stasis type dermatitis changes. She continues to have edema in the posterior left thigh in the size of her thigh is bigger than the right. Electronic Signature(s) Signed: 11/16/2019 3:45:53 PM By: Linton Ham MD Entered By: Linton Ham on 11/16/2019 10:10:51 Susan [name] ([id number]) -------------------------------------------------------------------------------- Physician Orders Details Patient Name: Susan [name] Date of Service: 11/16/2019 9:45 AM Medical Record Number: [id number] Patient Account Number: [id number] Date of Birth/Sex: 25-Mar-1951 (69 y.o. F) Treating RN: Cornell Barman Primary Care Provider: Vic Blackbird Other Clinician: Referring Provider: Vic Blackbird Treating Provider/Extender: Tito Dine in Treatment: 2 Verbal / Phone Orders: No Diagnosis Coding Wound Cleansing Wound #2 Left,Lateral Upper Leg o Clean wound with Normal Saline. o May Shower, gently pat wound dry prior to applying new dressing. Anesthetic (add to Medication List) Wound #2 Left,Lateral Upper Leg o Topical Lidocaine 4% cream applied to wound bed prior  to debridement (In Clinic Only). Primary Wound Dressing Wound #2 Left,Lateral Upper Leg o Silver Alginate Secondary Dressing Wound #2 Left,Lateral Upper Leg o Boardered Foam Dressing Follow-up Appointments o Return Appointment in 1 week. Electronic Signature(s) Signed: 11/16/2019 3:45:53 PM By: Linton Ham MD Signed: 11/16/2019 3:55:21 PM By: [name], BSN, RN, CWS, Kim RN, BSN Entered By: [name], BSN, RN, CWS, Kim on 11/16/2019 09:54:49 Susan [name] ([id number]) -------------------------------------------------------------------------------- Problem List Details Patient Name: Susan [name] Date of Service: 11/16/2019 9:45 AM Medical Record Number: [id number] Patient Account Number: [id number] Date of Birth/Sex: 07-Nov-1950 (69 y.o. 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 11/02/2019 No Yes L03.116 Cellulitis of left lower limb 11/02/2019 No Yes I50.30 Unspecified diastolic (congestive) heart failure 11/02/2019 No Yes E11.622 Type 2 diabetes mellitus with other skin ulcer 11/02/2019 No Yes Inactive Problems Resolved Problems Electronic Signature(s) Signed: 11/16/2019 3:45:53 PM By: Linton Ham MD Entered By: Linton Ham on 11/16/2019 10:06:31 Susan [name] ([id number]) -------------------------------------------------------------------------------- Progress Note Details Patient Name: Susan [name] Date of Service: 11/16/2019 9:45 AM Medical Record Number: [id number] Patient Account Number: [id number] Date  of Birth/Sex: 01/21/1951 (69 y.o. F) Treating RN: Cornell Barman Primary Care Provider: Vic Blackbird Other Clinician: Referring Provider: Vic Blackbird Treating Provider/Extender: Tito Dine in Treatment: 2 Subjective History of Present Illness (HPI) ADMISSION 11/02/2019 This is a 69 year old woman  who is a type II diabetic on insulin. She also has a history of chronic venous insufficiency in her lower extremities for which she is seeing Dr. Sharol Given in the past and wears compression stockings for recurrent edema and blistering in her lower extremities. She has done well. She feels that the lower extremity stockings push fluid up into her thighs and recently in the last 3 weeks she developed blister on her left posterior medial thigh which is opened into a wound. She was seen by her nephrologist apparently started on doxycycline. Seen her primary doctor yesterday who is Dr. [name] and put on Bactrim 1 tablet daily. She also has chronic renal failure stage IV and a history of diastolic congestive heart failure. She takes reasonably high doses of Demadex apparently. Past medical history; diastolic congestive heart failure, chronic kidney disease stage IV most recent creatinine ICU was 1.91, chronic venous insufficiency, osteoarthritis, coronary artery disease, hypothyroidism and type 2 diabetes on insulin with the most recent hemoglobin A1c we have available at 5.7 We did not do our standard ABI in our clinic because the wound was on her thigh however looking back through [facility] health link she did have arterial studies in 2019 at which time the ABI on the right was 1.21 with biphasic waveforms and a TBI of 0.72 on the left her ABI was 1.21 again with biphasic waveforms and a TBI of 0.65 at least in 2019 her blood flow seemed fairly normal. 6/16; patient has reflux studies on 6/21. Her husband states the dressing did not stay on very easily on her medial thigh however he purchased something  that sounds like ABD pads and they eventually did stay in place. We have been using silver alginate. She did also develop a new wound on the posterior lateral thigh. not really sure how this happened. 6/23; the patient went for venous studies but seems to have ended up but heart and vascular in [place]. She did not have evidence of a DVT on either side. Noted to have a cystic structure in the popliteal fossa although I am not exactly sure what that is it was avascular question Baker's cyst. They recommended follow-up for a venous reflux study with vein and vascular although that is what we thought we were sending her for. The patient has closed the wound on the medial thigh which is the wound she came in with. She developed another wound last week on the lateral thigh that is very superficial and should be closed by next week. Patient has followed with Dr. Sharol Given for bilateral lower extremity stockings and venous disease. She has an enlarged left thigh versus the right thigh. I think she needs formal venous reflux studies and consideration of central venous obstruction on the left. I am going to refer her to vein and vascular to see the surgeon in consultation with regards to both of the latter issues. If she does not have venous issues either correctable reflux or central venous disease and I think she is going to need thigh-high compression on the left side Objective Constitutional Patient is hypertensive.. Pulse regular and within target range for patient.Marland Kitchen Respirations regular, non-labored and within target range.. Temperature is normal and within the target range for the patient.Marland Kitchen appears in no distress. Vitals Time Taken: 9:37 AM, Height: 65 in, Weight: 271 lbs, BMI: 45.1, Temperature: 97.8 F, Pulse: 83 bpm, Respiratory Rate: 16 breaths/min, Blood Pressure: 158/64 mmHg. Cardiovascular Pedal pulses are palpable. General Notes: Wound exam; hearing questions on the left  medial posterior  thigh this is a very clean superficial wound and may be closed by next week. The area on the medial part of her left thigh is epithelialized although there is continued inflammation in this area likely related to stasis type dermatitis changes. She continues to have edema in the posterior left thigh in the size of her thigh is bigger than the right. Integumentary (Hair, Skin) Wound #1 status is Open. Original cause of wound was Blister. The wound is located on the Left,Medial Upper Leg. The wound measures 0cm length x 0cm width x 0cm depth; 0cm^2 area and 0cm^3 volume. The wound is limited to skin breakdown. There is no tunneling or undermining noted. There is a none present amount of drainage noted. The wound margin is flat and intact. There is no granulation within the wound bed. There is no necrotic tissue within the wound bed. ARMIDA, VICKROY ([id number]) Wound #2 status is Open. Original cause of wound was Trauma. The wound is located on the Left,Lateral Upper Leg. The wound measures 0.6cm length x 2.4cm width x 0.1cm depth; 1.131cm^2 area and 0.113cm^3 volume. There is Fat Layer (Subcutaneous Tissue) Exposed exposed. There is no tunneling or undermining noted. There is a medium amount of serous drainage noted. The wound margin is flat and intact. There is large (67-100%) pink granulation within the wound bed. There is no necrotic tissue within the wound bed. 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 May Shower, gently pat wound dry prior to applying new dressing. Anesthetic (add to  Medication List): Wound #2 Left,Lateral Upper Leg: Topical Lidocaine 4% cream applied to wound bed prior to debridement (In Clinic Only). Primary Wound Dressing: Wound #2 Left,Lateral Upper Leg: Silver Alginate Secondary Dressing: Wound #2 Left,Lateral Upper Leg: Boardered Foam Dressing Follow-up Appointments: Return Appointment in 1 week. 1. The patient's wounds firstly on her medial left thigh and now the lateral are just above closed 2. I see no reason to change the primary dressing here which is silver alginate with a border foam dressing. Wear Ace wrapping this area 3. The patient does not have a DVT by the study we have had none. I agree she needs reflux studies and I think a consideration of more central venous obstruction in the left leg as such I wonder whether she needs a standard venogram, CT or MR venogram I am just not familiar with the best test to order here. I will ask for the assistance of the vein and vascular in a formal consultation Electronic Signature(s) Signed: 11/16/2019 3:45:53 PM By: Linton Ham MD Entered By: Linton Ham on 11/16/2019 10:14:00 Susan [name] ([id number]) -------------------------------------------------------------------------------- SuperBill Details Patient Name: Susan [name] Date of Service: 11/16/2019 Medical Record Number: [id number] Patient Account Number: [id number] Date of Birth/Sex: 12-05-1950 (69 y.o. 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 L03.116 Cellulitis of left lower limb I50.30 Unspecified diastolic  (  congestive) heart failure E11.622 Type 2 diabetes mellitus with other skin ulcer Facility Procedures CPT4 Code: [id number] Description: 99213 - WOUND CARE VISIT-LEV 3 EST PT Modifier: Quantity: 1 Physician Procedures CPT4 Code Description: [id number] 99214 - WC PHYS LEVEL 4 - EST PT Modifier: Quantity: 1 CPT4 Code Description: ICD-10 Diagnosis Description I87.332 Chronic venous hypertension (idiopathic) with ulcer and inflammation of L97.121 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

## 2019-11-16 NOTE — Progress Notes (Signed)
None Present Medium N/A Exudate Type: N/A Serous N/A Exudate Color: N/A amber N/A Wound Margin: Flat and Intact Flat and Intact N/A Granulation Amount: None Present (0%) Large (67-100%) N/A Granulation Quality: N/A Pink N/A Necrotic Amount: None Present (0%) None Present (0%) N/A Exposed Structures: Fascia: No Fat Layer (Subcutaneous Tissue) N/A Fat Layer (Subcutaneous Tissue) Exposed: Yes Exposed: No Fascia: No Tendon: No Tendon: No Muscle: No Muscle: No Joint: No Joint: No Bone: No Bone: No Limited to Skin Breakdown Epithelialization: Large (67-100%) Medium (34-66%) N/A Treatment Notes Wound #2 (Left, Lateral Upper Leg) Notes silvercel, abd, kerlix, ace wrap Susan [name], Susan [name] ([id number]) Electronic Signature(s) Signed: 11/16/2019 3:45:53 PM By: Linton Ham MD Entered By: Linton Ham on 11/16/2019 10:06:49 Susan [name] ([id number]) -------------------------------------------------------------------------------- Multi-Disciplinary Care Plan Details Patient Name: Susan [name] Date of Service: 11/16/2019 9:45 AM Medical Record Number: [id number] Patient Account Number: [id number] Date of Birth/Sex: 09/01/1950 (69 y.o. F) Treating RN: Cornell Barman Primary Care Floride Hutmacher: Vic Blackbird Other Clinician: Referring Jerrilynn Mikowski: Vic Blackbird Treating Jocelin Schuelke/Extender: Tito Dine in Treatment: 2 Active Inactive Abuse / Safety / Falls / Self Care Management Nursing Diagnoses: Potential for falls Goals: Patient will remain injury free related to falls Date Initiated: 11/02/2019 Target Resolution Date: 01/28/2020 Goal Status: Active Interventions: Assess fall risk on admission and as needed Notes: Orientation to the Wound Care Program Nursing Diagnoses: Knowledge deficit related to the wound healing center program Goals: Patient/caregiver will verbalize understanding of the [facility] Date Initiated: 11/02/2019 Target Resolution Date: 01/28/2020 Goal Status: Active Interventions: Provide education on orientation to the wound center Notes: Venous Leg Ulcer Nursing Diagnoses: Potential for venous Insuffiency (use before diagnosis confirmed) Goals: Patient will maintain optimal edema control Date Initiated: 11/02/2019 Target Resolution Date: 01/28/2020 Goal Status: Active Interventions: Assess peripheral edema status every visit. Notes: Wound/Skin Impairment Nursing Diagnoses: Impaired tissue integrity Goals: Ulcer/skin breakdown will heal within 14 weeks Date Initiated: 11/02/2019 Target Resolution Date: 01/28/2020 Goal Status: Active Susan [name], Susan [name] ([id number]) Interventions: Assess patient/caregiver ability to obtain necessary supplies Assess patient/caregiver ability to perform ulcer/skin care regimen upon admission and as needed Assess ulceration(s) every visit Notes: Electronic Signature(s) Signed: 11/16/2019 3:55:21 PM By: [name], BSN, RN, CWS, Kim RN, BSN Entered By: [name], BSN, RN, CWS, Kim on 11/16/2019 09:53:28 Susan [name] ([id number]) -------------------------------------------------------------------------------- Pain Assessment Details Patient Name: Susan [name] Date of Service: 11/16/2019 9:45 AM Medical Record Number: [id number] Patient Account Number: [id number] Date of Birth/Sex: October 24, 1950 (69 y.o.  F) Treating RN: Montey Hora Primary Care Kenyah Luba: Vic Blackbird Other Clinician: Referring Duvan Mousel: Vic Blackbird Treating Alma Muegge/Extender: Tito Dine in Treatment: 2 Active Problems Location of Pain Severity and Description of Pain Patient Has Paino Yes Site Locations Pain Location: Pain in Ulcers With Dressing Change: Yes Duration of the Pain. Constant / Intermittento Intermittent Character of Pain Describe the Pain: Throbbing Pain Management and Medication Current Pain Management: Electronic Signature(s) Signed: 11/16/2019 3:45:40 PM By: Montey Hora Entered By: Montey Hora on 11/16/2019 09:37:19 Susan [name] ([id number]) -------------------------------------------------------------------------------- Patient/Caregiver Education Details Patient Name: Susan [name] Date of Service: 11/16/2019 9:45 AM Medical Record Number: [id number] Patient Account Number: [id number] Date of Birth/Gender: September 17, 1950 (69 y.o. F) Treating RN: Cornell Barman Primary Care Physician: Vic Blackbird Other Clinician: Referring Physician: Vic Blackbird Treating Physician/Extender: Tito Dine in Treatment: 2 Education Assessment Education Provided To: Patient Education Topics Provided Wound/Skin Impairment: Handouts: Caring for Your Ulcer Methods: Demonstration, Explain/Verbal Responses: State content correctly Electronic Signature(s) Signed: 11/16/2019 3:55:21 PM By: [name], BSN, RN,  Susan [name], Susan [name] ([id number]) Visit Report for 11/16/2019 Arrival Information Details Patient Name: Susan [name], Susan [name] Date of Service: 11/16/2019 9:45 AM Medical Record Number: [id number] Patient Account Number: [id number] Date of Birth/Sex: [birth year] (69 y.o. F) Treating RN: Montey Hora Primary Care Mescal Flinchbaugh: Vic Blackbird Other Clinician: Referring Sonnie Pawloski: Vic Blackbird Treating Lathan Gieselman/Extender: Tito Dine in Treatment: 2 Visit Information History Since Last Visit Added or deleted any medications: No Patient Arrived: Ambulatory Any new allergies or adverse reactions: No Arrival Time: 09:34 Had a fall or experienced change in No Accompanied By: husband activities of daily living that may affect Transfer Assistance: None risk of falls: Patient Identification Verified: Yes Signs or symptoms of abuse/neglect since last visito No Secondary Verification Process Completed: Yes Hospitalized since last visit: No Implantable device outside of the clinic excluding No cellular tissue based products placed in the center since last visit: Has Dressing in Place as Prescribed: Yes Pain Present Now: Yes Electronic Signature(s) Signed: 11/16/2019 3:45:40 PM By: Montey Hora Entered By: Montey Hora on 11/16/2019 09:36:57 Susan [name] ([id number]) -------------------------------------------------------------------------------- Clinic Level of Care Assessment Details Patient Name: Susan [name] Date of Service: 11/16/2019 9:45 AM Medical Record Number: [id number] Patient Account Number: [id number] Date of Birth/Sex: [birth year] (69 y.o. F) Treating RN: Cornell Barman Primary Care Elieser Tetrick: Vic Blackbird Other Clinician: Referring Denee Boeder: Vic Blackbird Treating Lizvet Chunn/Extender: Tito Dine in Treatment: 2 Clinic Level of Care Assessment Items TOOL 4 Quantity Score []  - Use when only an EandM is performed on FOLLOW-UP visit 0 ASSESSMENTS - Nursing Assessment /  Reassessment X - Reassessment of Co-morbidities (includes updates in patient status) 1 10 X- 1 5 Reassessment of Adherence to Treatment Plan ASSESSMENTS - Wound and Skin Assessment / Reassessment X - Simple Wound Assessment / Reassessment - one wound 1 5 []  - 0 Complex Wound Assessment / Reassessment - multiple wounds []  - 0 Dermatologic / Skin Assessment (not related to wound area) ASSESSMENTS - Focused Assessment []  - Circumferential Edema Measurements - multi extremities 0 []  - 0 Nutritional Assessment / Counseling / Intervention []  - 0 Lower Extremity Assessment (monofilament, tuning fork, pulses) []  - 0 Peripheral Arterial Disease Assessment (using hand held doppler) ASSESSMENTS - Ostomy and/or Continence Assessment and Care []  - Incontinence Assessment and Management 0 []  - 0 Ostomy Care Assessment and Management (repouching, etc.) PROCESS - Coordination of Care X - Simple Patient / Family Education for ongoing care 1 15 []  - 0 Complex (extensive) Patient / Family Education for ongoing care []  - 0 Staff obtains Programmer, systems, Records, Test Results / Process Orders []  - 0 Staff telephones HHA, Nursing Homes / Clarify orders / etc []  - 0 Routine Transfer to another Facility (non-emergent condition) []  - 0 Routine Hospital Admission (non-emergent condition) []  - 0 New Admissions / Biomedical engineer / Ordering NPWT, Apligraf, etc. []  - 0 Emergency Hospital Admission (emergent condition) X- 1 10 Simple Discharge Coordination []  - 0 Complex (extensive) Discharge Coordination PROCESS - Special Needs []  - Pediatric / Minor Patient Management 0 []  - 0 Isolation Patient Management []  - 0 Hearing / Language / Visual special needs []  - 0 Assessment of Community assistance (transportation, D/C planning, etc.) []  - 0 Additional assistance / Altered mentation []  - 0 Support Surface(s) Assessment (bed, cushion, seat, etc.) INTERVENTIONS - Wound Cleansing /  Measurement [name], Susan ([id number]) X- 1 5 Simple Wound Cleansing - one wound []  - 0 Complex Wound Cleansing - multiple wounds X- 1 5 Wound Imaging (photographs - any number of wounds) []  -  Susan [name], Susan [name] ([id number]) Visit Report for 11/16/2019 Arrival Information Details Patient Name: Susan [name], Susan [name] Date of Service: 11/16/2019 9:45 AM Medical Record Number: [id number] Patient Account Number: [id number] Date of Birth/Sex: [birth year] (69 y.o. F) Treating RN: Montey Hora Primary Care Mescal Flinchbaugh: Vic Blackbird Other Clinician: Referring Sonnie Pawloski: Vic Blackbird Treating Lathan Gieselman/Extender: Tito Dine in Treatment: 2 Visit Information History Since Last Visit Added or deleted any medications: No Patient Arrived: Ambulatory Any new allergies or adverse reactions: No Arrival Time: 09:34 Had a fall or experienced change in No Accompanied By: husband activities of daily living that may affect Transfer Assistance: None risk of falls: Patient Identification Verified: Yes Signs or symptoms of abuse/neglect since last visito No Secondary Verification Process Completed: Yes Hospitalized since last visit: No Implantable device outside of the clinic excluding No cellular tissue based products placed in the center since last visit: Has Dressing in Place as Prescribed: Yes Pain Present Now: Yes Electronic Signature(s) Signed: 11/16/2019 3:45:40 PM By: Montey Hora Entered By: Montey Hora on 11/16/2019 09:36:57 Susan [name] ([id number]) -------------------------------------------------------------------------------- Clinic Level of Care Assessment Details Patient Name: Susan [name] Date of Service: 11/16/2019 9:45 AM Medical Record Number: [id number] Patient Account Number: [id number] Date of Birth/Sex: [birth year] (69 y.o. F) Treating RN: Cornell Barman Primary Care Elieser Tetrick: Vic Blackbird Other Clinician: Referring Denee Boeder: Vic Blackbird Treating Lizvet Chunn/Extender: Tito Dine in Treatment: 2 Clinic Level of Care Assessment Items TOOL 4 Quantity Score []  - Use when only an EandM is performed on FOLLOW-UP visit 0 ASSESSMENTS - Nursing Assessment /  Reassessment X - Reassessment of Co-morbidities (includes updates in patient status) 1 10 X- 1 5 Reassessment of Adherence to Treatment Plan ASSESSMENTS - Wound and Skin Assessment / Reassessment X - Simple Wound Assessment / Reassessment - one wound 1 5 []  - 0 Complex Wound Assessment / Reassessment - multiple wounds []  - 0 Dermatologic / Skin Assessment (not related to wound area) ASSESSMENTS - Focused Assessment []  - Circumferential Edema Measurements - multi extremities 0 []  - 0 Nutritional Assessment / Counseling / Intervention []  - 0 Lower Extremity Assessment (monofilament, tuning fork, pulses) []  - 0 Peripheral Arterial Disease Assessment (using hand held doppler) ASSESSMENTS - Ostomy and/or Continence Assessment and Care []  - Incontinence Assessment and Management 0 []  - 0 Ostomy Care Assessment and Management (repouching, etc.) PROCESS - Coordination of Care X - Simple Patient / Family Education for ongoing care 1 15 []  - 0 Complex (extensive) Patient / Family Education for ongoing care []  - 0 Staff obtains Programmer, systems, Records, Test Results / Process Orders []  - 0 Staff telephones HHA, Nursing Homes / Clarify orders / etc []  - 0 Routine Transfer to another Facility (non-emergent condition) []  - 0 Routine Hospital Admission (non-emergent condition) []  - 0 New Admissions / Biomedical engineer / Ordering NPWT, Apligraf, etc. []  - 0 Emergency Hospital Admission (emergent condition) X- 1 10 Simple Discharge Coordination []  - 0 Complex (extensive) Discharge Coordination PROCESS - Special Needs []  - Pediatric / Minor Patient Management 0 []  - 0 Isolation Patient Management []  - 0 Hearing / Language / Visual special needs []  - 0 Assessment of Community assistance (transportation, D/C planning, etc.) []  - 0 Additional assistance / Altered mentation []  - 0 Support Surface(s) Assessment (bed, cushion, seat, etc.) INTERVENTIONS - Wound Cleansing /  Measurement [name], Susan ([id number]) X- 1 5 Simple Wound Cleansing - one wound []  - 0 Complex Wound Cleansing - multiple wounds X- 1 5 Wound Imaging (photographs - any number of wounds) []  -  Susan [name], Susan [name] ([id number]) Visit Report for 11/16/2019 Arrival Information Details Patient Name: Susan [name], Susan [name] Date of Service: 11/16/2019 9:45 AM Medical Record Number: [id number] Patient Account Number: [id number] Date of Birth/Sex: [birth year] (69 y.o. F) Treating RN: Montey Hora Primary Care Mescal Flinchbaugh: Vic Blackbird Other Clinician: Referring Sonnie Pawloski: Vic Blackbird Treating Lathan Gieselman/Extender: Tito Dine in Treatment: 2 Visit Information History Since Last Visit Added or deleted any medications: No Patient Arrived: Ambulatory Any new allergies or adverse reactions: No Arrival Time: 09:34 Had a fall or experienced change in No Accompanied By: husband activities of daily living that may affect Transfer Assistance: None risk of falls: Patient Identification Verified: Yes Signs or symptoms of abuse/neglect since last visito No Secondary Verification Process Completed: Yes Hospitalized since last visit: No Implantable device outside of the clinic excluding No cellular tissue based products placed in the center since last visit: Has Dressing in Place as Prescribed: Yes Pain Present Now: Yes Electronic Signature(s) Signed: 11/16/2019 3:45:40 PM By: Montey Hora Entered By: Montey Hora on 11/16/2019 09:36:57 Susan [name] ([id number]) -------------------------------------------------------------------------------- Clinic Level of Care Assessment Details Patient Name: Susan [name] Date of Service: 11/16/2019 9:45 AM Medical Record Number: [id number] Patient Account Number: [id number] Date of Birth/Sex: [birth year] (69 y.o. F) Treating RN: Cornell Barman Primary Care Elieser Tetrick: Vic Blackbird Other Clinician: Referring Denee Boeder: Vic Blackbird Treating Lizvet Chunn/Extender: Tito Dine in Treatment: 2 Clinic Level of Care Assessment Items TOOL 4 Quantity Score []  - Use when only an EandM is performed on FOLLOW-UP visit 0 ASSESSMENTS - Nursing Assessment /  Reassessment X - Reassessment of Co-morbidities (includes updates in patient status) 1 10 X- 1 5 Reassessment of Adherence to Treatment Plan ASSESSMENTS - Wound and Skin Assessment / Reassessment X - Simple Wound Assessment / Reassessment - one wound 1 5 []  - 0 Complex Wound Assessment / Reassessment - multiple wounds []  - 0 Dermatologic / Skin Assessment (not related to wound area) ASSESSMENTS - Focused Assessment []  - Circumferential Edema Measurements - multi extremities 0 []  - 0 Nutritional Assessment / Counseling / Intervention []  - 0 Lower Extremity Assessment (monofilament, tuning fork, pulses) []  - 0 Peripheral Arterial Disease Assessment (using hand held doppler) ASSESSMENTS - Ostomy and/or Continence Assessment and Care []  - Incontinence Assessment and Management 0 []  - 0 Ostomy Care Assessment and Management (repouching, etc.) PROCESS - Coordination of Care X - Simple Patient / Family Education for ongoing care 1 15 []  - 0 Complex (extensive) Patient / Family Education for ongoing care []  - 0 Staff obtains Programmer, systems, Records, Test Results / Process Orders []  - 0 Staff telephones HHA, Nursing Homes / Clarify orders / etc []  - 0 Routine Transfer to another Facility (non-emergent condition) []  - 0 Routine Hospital Admission (non-emergent condition) []  - 0 New Admissions / Biomedical engineer / Ordering NPWT, Apligraf, etc. []  - 0 Emergency Hospital Admission (emergent condition) X- 1 10 Simple Discharge Coordination []  - 0 Complex (extensive) Discharge Coordination PROCESS - Special Needs []  - Pediatric / Minor Patient Management 0 []  - 0 Isolation Patient Management []  - 0 Hearing / Language / Visual special needs []  - 0 Assessment of Community assistance (transportation, D/C planning, etc.) []  - 0 Additional assistance / Altered mentation []  - 0 Support Surface(s) Assessment (bed, cushion, seat, etc.) INTERVENTIONS - Wound Cleansing /  Measurement [name], Susan ([id number]) X- 1 5 Simple Wound Cleansing - one wound []  - 0 Complex Wound Cleansing - multiple wounds X- 1 5 Wound Imaging (photographs - any number of wounds) []  -

## 2019-11-22 DIAGNOSIS — D509 Iron deficiency anemia, unspecified: Secondary | ICD-10-CM | POA: Diagnosis not present

## 2019-11-22 DIAGNOSIS — K5730 Diverticulosis of large intestine without perforation or abscess without bleeding: Secondary | ICD-10-CM | POA: Diagnosis not present

## 2019-11-22 DIAGNOSIS — Z7689 Persons encountering health services in other specified circumstances: Secondary | ICD-10-CM | POA: Diagnosis not present

## 2019-11-22 DIAGNOSIS — R748 Abnormal levels of other serum enzymes: Secondary | ICD-10-CM | POA: Diagnosis not present

## 2019-11-22 DIAGNOSIS — Z86010 Personal history of colonic polyps: Secondary | ICD-10-CM | POA: Diagnosis not present

## 2019-11-23 ENCOUNTER — Other Ambulatory Visit: Payer: Self-pay

## 2019-11-23 ENCOUNTER — Encounter: Payer: PPO | Admitting: Internal Medicine

## 2019-11-23 DIAGNOSIS — E11622 Type 2 diabetes mellitus with other skin ulcer: Secondary | ICD-10-CM | POA: Diagnosis not present

## 2019-11-23 DIAGNOSIS — L03116 Cellulitis of left lower limb: Secondary | ICD-10-CM | POA: Diagnosis not present

## 2019-11-23 DIAGNOSIS — I87332 Chronic venous hypertension (idiopathic) with ulcer and inflammation of left lower extremity: Secondary | ICD-10-CM | POA: Diagnosis not present

## 2019-11-23 DIAGNOSIS — L97121 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: Secondary | ICD-10-CM | POA: Diagnosis not present

## 2019-11-24 ENCOUNTER — Encounter: Payer: Self-pay | Admitting: Orthopedic Surgery

## 2019-11-24 ENCOUNTER — Ambulatory Visit (INDEPENDENT_AMBULATORY_CARE_PROVIDER_SITE_OTHER): Payer: PPO | Admitting: Orthopedic Surgery

## 2019-11-24 VITALS — Ht 66.0 in | Wt 267.0 lb

## 2019-11-24 DIAGNOSIS — N184 Chronic kidney disease, stage 4 (severe): Secondary | ICD-10-CM | POA: Diagnosis not present

## 2019-11-24 DIAGNOSIS — I872 Venous insufficiency (chronic) (peripheral): Secondary | ICD-10-CM | POA: Diagnosis not present

## 2019-11-24 DIAGNOSIS — B351 Tinea unguium: Secondary | ICD-10-CM

## 2019-11-24 DIAGNOSIS — I509 Heart failure, unspecified: Secondary | ICD-10-CM | POA: Diagnosis not present

## 2019-11-24 DIAGNOSIS — I739 Peripheral vascular disease, unspecified: Secondary | ICD-10-CM | POA: Diagnosis not present

## 2019-11-24 DIAGNOSIS — Z89431 Acquired absence of right foot: Secondary | ICD-10-CM

## 2019-11-24 DIAGNOSIS — E1122 Type 2 diabetes mellitus with diabetic chronic kidney disease: Secondary | ICD-10-CM | POA: Diagnosis not present

## 2019-11-24 DIAGNOSIS — I129 Hypertensive chronic kidney disease with stage 1 through stage 4 chronic kidney disease, or unspecified chronic kidney disease: Secondary | ICD-10-CM | POA: Diagnosis not present

## 2019-11-25 ENCOUNTER — Encounter: Payer: Self-pay | Admitting: Orthopedic Surgery

## 2019-11-25 DIAGNOSIS — E1165 Type 2 diabetes mellitus with hyperglycemia: Secondary | ICD-10-CM | POA: Diagnosis not present

## 2019-11-25 NOTE — Progress Notes (Signed)
Office Visit Note   Patient: Susan [name]           Date of Birth: 12-Apr-1951           MRN: [id number] Visit Date: 11/24/2019              Requested by: Alycia Rossetti, MD [address] [place],  [place] [address] PCP: Alycia Rossetti, MD  Chief Complaint  Patient presents with  . Left Foot - Follow-up      HPI: Patient is a 69 year old woman who presents for evaluation for onychomycotic nails x5 left foot transmetatarsal amputation on the right with venous stasis insufficiency both lower extremities.  Assessment & Plan: Visit Diagnoses:  1. Onychomycosis   2. Venous insufficiency (chronic) (peripheral)   3. History of transmetatarsal amputation of right foot ([place])     Plan: Patient will continue with her extra-large compression socks.  Nails were trimmed x5 without complications.  Follow-Up Instructions: Return in about 3 months (around 02/24/2020).   Ortho Exam  Patient is alert, oriented, no adenopathy, well-dressed, normal affect, normal respiratory effort. Examination patient has brawny skin color changes but no venous ulcers she is wearing knee-high compression stockings.  She has a stable transmetatarsal amputation on the right.  Left foot she has thickened discolored onychomycotic nails x5 she is unable to safely trim the nails on her own and nails were trimmed x5 without complications.  Most recent hemoglobin A1c is 13.1  Imaging: No results found. No images are attached to the encounter.  Labs: Lab Results  Component Value Date   HGBA1C 13.1 (H) 05/23/2019   HGBA1C 12.0 (H) 03/11/2019   HGBA1C 8.3 (H) 04/30/2018   CRP 14.4 (H) 05/28/2019   CRP 8.4 (H) 03/05/2018   LABURIC 9.1 (H) 10/12/2018   LABURIC 9.0 (H) 03/06/2018   REPTSTATUS 09/21/2019 FINAL 09/20/2019   CULT  09/20/2019    NO GROWTH Performed at [facility] [address] [place], [place] [address]    LABORGA KLEBSIELLA PNEUMONIAE (A) 05/28/2019     Lab Results    Component Value Date   ALBUMIN 3.3 (L) 09/19/2019   ALBUMIN 1.7 (L) 06/03/2019   ALBUMIN 1.7 (L) 06/02/2019   LABURIC 9.1 (H) 10/12/2018   LABURIC 9.0 (H) 03/06/2018    Lab Results  Component Value Date   MG 1.6 02/27/2017   MG 2.1 07/10/2015   MG 1.7 09/21/2013   No results found for: VD25OH  No results found for: PREALBUMIN CBC EXTENDED Latest Ref Rng & Units 09/26/2019 09/19/2019 09/02/2019  WBC 3.8 - 10.8 Thousand/uL 8.3 8.2 6.6  RBC 3.80 - 5.10 Million/uL 4.45 5.00 4.39  HGB 11.7 - 15.5 g/dL 11.8 13.0 11.6(L)  HCT 35 - 45 % 36.8 42.2 37.4  PLT 140 - 400 Thousand/uL 287 268 227  NEUTROABS 1,500 - 7,800 cells/uL 5,926 - 4,864  LYMPHSABS 850 - 3,900 cells/uL 1,112 - 647(L)     Body mass index is 43.09 kg/m.  Orders:  No orders of the defined types were placed in this encounter.  No orders of the defined types were placed in this encounter.    Procedures: No procedures performed  Clinical Data: No additional findings.  ROS:  All other systems negative, except as noted in the HPI. Review of Systems  Objective: Vital Signs: Ht 5\' 6"  (1.676 m)   Wt 267 lb (121.1 kg)   BMI 43.09 kg/m   Specialty Comments:  No specialty comments available.  PMFS History: Patient Active Problem List   Diagnosis Date Noted  . Common bile duct (CBD) obstruction 05/28/2019  . Benign neoplasm of ascending colon   . Benign neoplasm of transverse colon   . Benign neoplasm of descending colon   . Benign neoplasm of sigmoid colon   . Gastric polyps   . Hyperkalemia 03/11/2019  . Prolonged QT interval 03/11/2019  . Onychomycosis 06/21/2018  . Pressure injury of skin 03/19/2018  . Syncope 03/18/2018  . Osteomyelitis of second toe of right foot ([place])   . Toe osteomyelitis, right ([place])   . 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 ([place])   . PVD (peripheral vascular disease) ([place]) 10/26/2017  . Hypothyroid 07/27/2017  . PAH (pulmonary artery hypertension) ([place])    . Impaired ambulation 07/19/2017  . Leg cramps 02/27/2017  . Peripheral edema 01/12/2017  . Diabetic neuropathy ([place]) 11/12/2016  . 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 mellitus ([place]) 10/24/2015  . Anemia 10/03/2015  . Generalized anxiety disorder 10/03/2015  . Insomnia 10/03/2015  . Chest pain   . Gastritis 06/07/2015  . Diabetes mellitus type II, uncontrolled ([place]) 06/07/2015  . Chronic diastolic CHF (congestive heart failure) ([place]) 06/07/2015  . Non compliance with medical treatment 04/17/2014  . Rotator cuff tear 03/14/2014  . Morbid obesity ([place]) 09/23/2013  . Acute kidney injury superimposed on chronic kidney disease ([place]) 12/25/2012  . Hypotension 12/25/2012  . Urinary incontinence   . MDD (major depressive disorder) 11/12/2010  . RBBB (right bundle branch block)   . CAD (coronary artery disease)   . Hyperlipemia 01/22/2009  . Essential hypertension 01/22/2009   Past Medical History:  Diagnosis Date  . Acute MI ([place]) 1999; 2007  . Anemia    hx  . Anginal pain ([place])   . Anxiety   . ARF (acute renal failure) ([place]) 06/2017   [facility]  . Arthritis    "generalized" (03/15/2014)  . CAD (coronary artery disease)    MI in 2000 - MI  2007 - treated bare metal stent (no nuclear since then as 9/11)  . Carotid artery disease ([place])   . CHF (congestive heart failure) ([place])   . Chronic diastolic heart failure (HCC)    a) ECHO (08/2013) EF 55-60% and RV function nl b) RHC (08/2013) RA 4, RV 30/5/7, PA 25/10 (16), PCWP 7, Fick CO/CI 6.3/2.7, PVR 1.5 WU, PA 61 and 66%  . Daily headache    "~ every other day; since I fell in June" (03/15/2014)  . Depression   . Dyslipidemia   . Dyspnea   . Exertional shortness of breath   . HTN (hypertension)   . Hypothyroidism   . Neuropathy   . Obesity   . Osteoarthritis   . Peripheral neuropathy   . PONV (postoperative nausea and vomiting)   . RBBB (right bundle branch block)    Old  . Stroke [facility])    mini strokes  . Syncope     likely due to low blood sugar  . Tachycardia    Sinus tachycardia  . Type II diabetes mellitus (HCC)    Type II  . Urinary incontinence   . Venous insufficiency     Family History  Problem Relation Age of Onset  . Heart attack Mother 79    Past Surgical History:  Procedure Laterality Date  . ABDOMINAL HYSTERECTOMY  1980's  . AMPUTATION Right 02/24/2018   Procedure: RIGHT FOOT GREAT TOE AND 2ND TOE AMPUTATION;  Surgeon: Newt Minion, MD;  Location: [facility];  Service: Orthopedics;  Laterality: Right;  . AMPUTATION Right 04/30/2018   Procedure: RIGHT TRANSMETATARSAL AMPUTATION;  Surgeon: Newt Minion, MD;  Location: [facility];  Service: Orthopedics;  Laterality: Right;  . CATARACT EXTRACTION, BILATERAL Bilateral ?2013  . COLONOSCOPY W/ POLYPECTOMY    . COLONOSCOPY WITH PROPOFOL N/A 03/13/2019   Procedure: COLONOSCOPY WITH PROPOFOL;  Surgeon: Jerene Bears, MD;  Location: [facility];  Service: Gastroenterology;  Laterality: N/A;  . [place]; 2007   "1 + 1"  . ESOPHAGOGASTRODUODENOSCOPY (EGD) WITH PROPOFOL N/A 03/13/2019   Procedure: ESOPHAGOGASTRODUODENOSCOPY (EGD) WITH PROPOFOL;  Surgeon: Jerene Bears, MD;  Location: [facility] ENDOSCOPY;  Service: Gastroenterology;  Laterality: N/A;  . EYE SURGERY Bilateral    l[place]  . HEMOSTASIS CLIP PLACEMENT  03/13/2019   Procedure: HEMOSTASIS CLIP PLACEMENT;  Surgeon: Jerene Bears, MD;  Location: [facility] ENDOSCOPY;  Service: Gastroenterology;;  . KNEE ARTHROSCOPY Left 10/25/2006  . POLYPECTOMY  03/13/2019   Procedure: POLYPECTOMY;  Surgeon: Jerene Bears, MD;  Location: [facility];  Service: Gastroenterology;;  . RIGHT HEART CATH N/A 07/24/2017   Procedure: RIGHT HEART CATH;  Surgeon: Jolaine Artist, MD;  Location: [facility] CV LAB;  Service: Cardiovascular;  Laterality: N/A;  . RIGHT HEART CATHETERIZATION N/A 09/22/2013   Procedure: RIGHT HEART CATH;  Surgeon: Jolaine Artist, MD;  Location: [facility] CATH LAB;   Service: Cardiovascular;  Laterality: N/A;  . SHOULDER ARTHROSCOPY WITH OPEN ROTATOR CUFF REPAIR Right 03/14/2014   Procedure: RIGHT SHOULDER ARTHROSCOPY WITH BICEPS RELEASE, OPEN SUBSCAPULA REPAIR, OPEN SUPRASPINATUS REPAIR.;  Surgeon: Meredith Pel, MD;  Location: [facility];  Service: Orthopedics;  Laterality: Right;  . TOE AMPUTATION Right 02/24/2018   GREAT TOE AND 2ND TOE AMPUTATION  . TUBAL LIGATION  1970's   Social History   Occupational History    Employer: UNEMPLOYED  Tobacco Use  . Smoking status: Former Smoker    Packs/day: 3.00    Years: 32.00    Pack years: 96.00    Types: Cigarettes    Quit date: 10/24/1997    Years since quitting: 22.1  . Smokeless tobacco: Never Used  Vaping Use  . Vaping Use: Never used  Substance and Sexual Activity  . Alcohol use: Not Currently    Comment: "might have 2-3 daiquiris in the summer"  . Drug use: No  . Sexual activity: Never

## 2019-11-29 NOTE — Progress Notes (Signed)
Susan [name] ([id number]) -------------------------------------------------------------------------------- Multi-Disciplinary Care Plan Details Patient Name: Susan [name], Susan [name] Date of Service: 11/23/2019 2:15 PM Medical Record Number: [id number] Patient Account Number: [id number] Date of Birth/Sex: May 17, 1951 (69 y.o. F) Treating RN: Cornell Barman Primary Care Sumiya Mamaril: Vic Blackbird Other Clinician: Referring Sebastain Fishbaugh: Vic Blackbird Treating Ersilia Brawley/Extender: Tito Dine in Treatment: 3 Active Inactive Electronic Signature(s) Signed: 11/23/2019 5:56:56 PM By: [name], BSN, RN, CWS, Kim RN, BSN Previous Signature: 11/23/2019 4:49:35 PM Version By: [name], BSN, RN, CWS, Kim RN, BSN Entered By: [name], BSN, RN, CWS, Kim on 11/23/2019 17:56:56 Susan [name] ([id number]) -------------------------------------------------------------------------------- Pain Assessment Details Patient Name: Susan [name] Date of Service: 11/23/2019 2:15 PM Medical Record Number: [id number] Patient Account Number: [id number] Date of Birth/Sex: [birth year] (69 y.o. F) Treating RN: Cornell Barman Primary Care Akaylah Lalley: Vic Blackbird Other Clinician: Referring Draysen Weygandt: Vic Blackbird Treating Sherill Mangen/Extender: Tito Dine in Treatment: 3 Active Problems Location of Pain Severity and Description of Pain Patient Has Paino No Site Locations Pain Management and Medication Current Pain  Management: Electronic Signature(s) Signed: 11/23/2019 2:35:46 PM By: [name], BSN, RN, CWS, Kim RN, BSN Entered By: [name], BSN, RN, CWS, Kim on 11/23/2019 14:04:56 Susan [name] ([id number]) -------------------------------------------------------------------------------- Wound Assessment Details Patient Name: Susan [name] Date of Service: 11/23/2019 2:15 PM Medical Record Number: [id number] Patient Account Number: [id number] Date of Birth/Sex: 30-Mar-1951 (69 y.o. F) Treating RN: Cornell Barman Primary Care Antwyne Pingree: Vic Blackbird Other Clinician: Referring Gaige Fussner: Vic Blackbird Treating Niyah Mamaril/Extender: Ricard Dillon Weeks in Treatment: 3 Wound Status Wound Number: 2 Primary Etiology: Skin Tear Wound Location: Left, Lateral Upper Leg Wound Status: Healed - Epithelialized Wounding Event: Trauma Date Acquired: 11/07/2019 Weeks Of Treatment: 2 Clustered Wound: No Photos Photo Uploaded By: [name], BSN, RN, CWS, Kim on 11/23/2019 14:07:17 Wound Measurements Length: (cm) Width: (cm) Depth: (cm) Area: (cm) Volume: (cm) 0 % Reduction in Area: 100% 0 % Reduction in Volume: 100% 0 0 0 Wound Description Classification: Full Thickness Without Exposed Support Structu res Electronic Signature(s) Signed: 11/23/2019 2:35:46 PM By: [name], BSN, RN, CWS, Kim RN, BSN Entered By: [name], BSN, RN, CWS, Kim on 11/23/2019 14:06:41 Susan [name] ([id number]) -------------------------------------------------------------------------------- Vitals Details Patient Name: Susan [name] Date of Service: 11/23/2019 2:15 PM Medical Record Number: [id number] Patient Account Number: [id number] Date of Birth/Sex: 1950-10-22 (69 y.o. F) Treating RN: Cornell Barman Primary Care Brooklynne Pereida: Vic Blackbird Other Clinician: Referring Josie Mesa: Vic Blackbird Treating Leeza Heiner/Extender: Tito Dine in Treatment: 3 Vital Signs Time Taken: 14:00 Temperature (F): 97.9 Height (in): 65 Pulse  (bpm): 69 Weight (lbs): 271 Respiratory Rate (breaths/min): 18 Body Mass Index (BMI): 45.1 Blood Pressure (mmHg): 142/52 Reference Range: 80 - 120 mg / dl Electronic Signature(s) Signed: 11/23/2019 4:40:20 PM By: Lorine Bears RCP, RRT, CHT Entered By: Lorine Bears on 11/23/2019 14:03:44  Susan [name] ([id number]) -------------------------------------------------------------------------------- Multi-Disciplinary Care Plan Details Patient Name: Susan [name], Susan [name] Date of Service: 11/23/2019 2:15 PM Medical Record Number: [id number] Patient Account Number: [id number] Date of Birth/Sex: May 17, 1951 (69 y.o. F) Treating RN: Cornell Barman Primary Care Sumiya Mamaril: Vic Blackbird Other Clinician: Referring Sebastain Fishbaugh: Vic Blackbird Treating Ersilia Brawley/Extender: Tito Dine in Treatment: 3 Active Inactive Electronic Signature(s) Signed: 11/23/2019 5:56:56 PM By: [name], BSN, RN, CWS, Kim RN, BSN Previous Signature: 11/23/2019 4:49:35 PM Version By: [name], BSN, RN, CWS, Kim RN, BSN Entered By: [name], BSN, RN, CWS, Kim on 11/23/2019 17:56:56 Susan [name] ([id number]) -------------------------------------------------------------------------------- Pain Assessment Details Patient Name: Susan [name] Date of Service: 11/23/2019 2:15 PM Medical Record Number: [id number] Patient Account Number: [id number] Date of Birth/Sex: [birth year] (69 y.o. F) Treating RN: Cornell Barman Primary Care Akaylah Lalley: Vic Blackbird Other Clinician: Referring Draysen Weygandt: Vic Blackbird Treating Sherill Mangen/Extender: Tito Dine in Treatment: 3 Active Problems Location of Pain Severity and Description of Pain Patient Has Paino No Site Locations Pain Management and Medication Current Pain  Management: Electronic Signature(s) Signed: 11/23/2019 2:35:46 PM By: [name], BSN, RN, CWS, Kim RN, BSN Entered By: [name], BSN, RN, CWS, Kim on 11/23/2019 14:04:56 Susan [name] ([id number]) -------------------------------------------------------------------------------- Wound Assessment Details Patient Name: Susan [name] Date of Service: 11/23/2019 2:15 PM Medical Record Number: [id number] Patient Account Number: [id number] Date of Birth/Sex: 30-Mar-1951 (69 y.o. F) Treating RN: Cornell Barman Primary Care Antwyne Pingree: Vic Blackbird Other Clinician: Referring Gaige Fussner: Vic Blackbird Treating Niyah Mamaril/Extender: Ricard Dillon Weeks in Treatment: 3 Wound Status Wound Number: 2 Primary Etiology: Skin Tear Wound Location: Left, Lateral Upper Leg Wound Status: Healed - Epithelialized Wounding Event: Trauma Date Acquired: 11/07/2019 Weeks Of Treatment: 2 Clustered Wound: No Photos Photo Uploaded By: [name], BSN, RN, CWS, Kim on 11/23/2019 14:07:17 Wound Measurements Length: (cm) Width: (cm) Depth: (cm) Area: (cm) Volume: (cm) 0 % Reduction in Area: 100% 0 % Reduction in Volume: 100% 0 0 0 Wound Description Classification: Full Thickness Without Exposed Support Structu res Electronic Signature(s) Signed: 11/23/2019 2:35:46 PM By: [name], BSN, RN, CWS, Kim RN, BSN Entered By: [name], BSN, RN, CWS, Kim on 11/23/2019 14:06:41 Susan [name] ([id number]) -------------------------------------------------------------------------------- Vitals Details Patient Name: Susan [name] Date of Service: 11/23/2019 2:15 PM Medical Record Number: [id number] Patient Account Number: [id number] Date of Birth/Sex: 1950-10-22 (69 y.o. F) Treating RN: Cornell Barman Primary Care Brooklynne Pereida: Vic Blackbird Other Clinician: Referring Josie Mesa: Vic Blackbird Treating Leeza Heiner/Extender: Tito Dine in Treatment: 3 Vital Signs Time Taken: 14:00 Temperature (F): 97.9 Height (in): 65 Pulse  (bpm): 69 Weight (lbs): 271 Respiratory Rate (breaths/min): 18 Body Mass Index (BMI): 45.1 Blood Pressure (mmHg): 142/52 Reference Range: 80 - 120 mg / dl Electronic Signature(s) Signed: 11/23/2019 4:40:20 PM By: Lorine Bears RCP, RRT, CHT Entered By: Lorine Bears on 11/23/2019 14:03:44  Susan [name], Susan [name] ([id number]) Visit Report for 11/23/2019 Arrival Information Details Patient Name: Susan [name], Susan [name] Date of Service: 11/23/2019 2:15 PM Medical Record Number: [id number] Patient Account Number: [id number] Date of Birth/Sex: [birth year] (69 y.o. F) Treating RN: Cornell Barman Primary Care Jalayna Josten: Vic Blackbird Other Clinician: Referring Karlena Luebke: Vic Blackbird Treating Doretha Goding/Extender: Tito Dine in Treatment: 3 Visit Information History Since Last Visit Added or deleted any medications: No Patient Arrived: [place] Any new allergies or adverse reactions: No Arrival Time: 13:58 Had a fall or experienced change in No Accompanied By: self activities of daily living that may affect Transfer Assistance: None risk of falls: Patient Identification Verified: Yes Signs or symptoms of abuse/neglect since last visito No Secondary Verification Process Completed: Yes Hospitalized since last visit: No Implantable device outside of the clinic excluding No cellular tissue based products placed in the center since last visit: Has Dressing in Place as Prescribed: Yes Pain Present Now: No Electronic Signature(s) Signed: 11/23/2019 4:40:20 PM By: Lorine Bears RCP, RRT, CHT Entered By: Lorine Bears on 11/23/2019 14:00:46 Susan [name] ([id number]) -------------------------------------------------------------------------------- Clinic Level of Care Assessment Details Patient Name: Susan [name] Date of Service: 11/23/2019 2:15 PM Medical Record Number: [id number] Patient Account Number: [id number] Date of Birth/Sex: [birth year] (69 y.o. F) Treating RN: Cornell Barman Primary Care Lillyahna Hemberger: Vic Blackbird Other Clinician: Referring Mathew Postiglione: Vic Blackbird Treating Ione [place]/Extender: Tito Dine in Treatment: 3 Clinic Level of Care Assessment Items TOOL 4 Quantity Score []  - Use when only an EandM is performed on FOLLOW-UP visit  0 ASSESSMENTS - Nursing Assessment / Reassessment X - Reassessment of Co-morbidities (includes updates in patient status) 1 10 X- 1 5 Reassessment of Adherence to Treatment Plan ASSESSMENTS - Wound and Skin Assessment / Reassessment X - Simple Wound Assessment / Reassessment - one wound 1 5 []  - 0 Complex Wound Assessment / Reassessment - multiple wounds []  - 0 Dermatologic / Skin Assessment (not related to wound area) ASSESSMENTS - Focused Assessment []  - Circumferential Edema Measurements - multi extremities 0 []  - 0 Nutritional Assessment / Counseling / Intervention []  - 0 Lower Extremity Assessment (monofilament, tuning fork, pulses) []  - 0 Peripheral Arterial Disease Assessment (using hand held doppler) ASSESSMENTS - Ostomy and/or Continence Assessment and Care []  - Incontinence Assessment and Management 0 []  - 0 Ostomy Care Assessment and Management (repouching, etc.) PROCESS - Coordination of Care X - Simple Patient / Family Education for ongoing care 1 15 []  - 0 Complex (extensive) Patient / Family Education for ongoing care []  - 0 Staff obtains Programmer, systems, Records, Test Results / Process Orders []  - 0 Staff telephones HHA, Nursing Homes / Clarify orders / etc []  - 0 Routine Transfer to another Facility (non-emergent condition) []  - 0 Routine Hospital Admission (non-emergent condition) []  - 0 New Admissions / Biomedical engineer / Ordering NPWT, Apligraf, etc. []  - 0 Emergency Hospital Admission (emergent condition) X- 1 10 Simple Discharge Coordination []  - 0 Complex (extensive) Discharge Coordination PROCESS - Special Needs []  - Pediatric / Minor Patient Management 0 []  - 0 Isolation Patient Management []  - 0 Hearing / Language / Visual special needs []  - 0 Assessment of Community assistance (transportation, D/C planning, etc.) []  - 0 Additional assistance / Altered mentation []  - 0 Support Surface(s) Assessment (bed, cushion, seat,  etc.) INTERVENTIONS - Wound Cleansing / Measurement [place], Rhodie ([id number]) []  - 0 Simple Wound Cleansing - one wound []  - 0 Complex Wound Cleansing - multiple wounds X- 1 5 Wound Imaging (photographs -

## 2019-11-29 NOTE — Progress Notes (Signed)
Susan [name], Susan [name] ([id number]) Visit Report for 11/23/2019 HPI Details Patient Name: Susan [name], Susan [name] Date of Service: 11/23/2019 2:15 PM Medical Record Number: [id number] Patient Account Number: [id number] Date of Birth/Sex: [birth year] (69 y.o. F) Treating RN: Cornell Barman Primary Care Provider: Vic Blackbird Other Clinician: Referring Provider: Vic Blackbird Treating Provider/Extender: Tito Dine in Treatment: 3 History of Present Illness HPI Description: ADMISSION 11/02/2019 This is a 69 year old woman who is a type II diabetic on insulin. She also has a history of chronic venous insufficiency in her lower extremities for which she is seeing Dr. Sharol Given in the past and wears compression stockings for recurrent edema and blistering in her lower extremities. She has done well. She feels that the lower extremity stockings push fluid up into her thighs and recently in the last 3 weeks she developed blister on her left posterior medial thigh which is opened into a wound. She was seen by her nephrologist apparently started on doxycycline. Seen her primary doctor yesterday who is Dr. [name] and put on Bactrim 1 tablet daily. She also has chronic renal failure stage IV and a history of diastolic congestive heart failure. She takes reasonably high doses of Demadex apparently. Past medical history; diastolic congestive heart failure, chronic kidney disease stage IV most recent creatinine ICU was 1.91, chronic venous insufficiency, osteoarthritis, coronary artery disease, hypothyroidism and type 2 diabetes on insulin with the most recent hemoglobin A1c we have available at 5.7 We did not do our standard ABI in our clinic because the wound was on her thigh however looking back through [facility] health link she did have arterial studies in 2019 at which time the ABI on the right was 1.21 with biphasic waveforms and a TBI of 0.72 on the left her ABI was 1.21 again with biphasic waveforms and a TBI of 0.65 at  least in 2019 her blood flow seemed fairly normal. 6/16; patient has reflux studies on 6/21. Her husband states the dressing did not stay on very easily on her medial thigh however he purchased something that sounds like ABD pads and they eventually did stay in place. We have been using silver alginate. She did also develop a new wound on the posterior lateral thigh. not really sure how this happened. 6/23; the patient went for venous studies but seems to have ended up but heart and vascular in [place]. She did not have evidence of a DVT on either side. Noted to have a cystic structure in the popliteal fossa although I am not exactly sure what that is it was avascular question Baker's cyst. They recommended follow-up for a venous reflux study with vein and vascular although that is what we thought we were sending her for. The patient has closed the wound on the medial thigh which is the wound she came in with. She developed another wound last week on the lateral thigh that is very superficial and should be closed by next week. Patient has followed with Dr. Sharol Given for bilateral lower extremity stockings and venous disease. She has an enlarged left thigh versus the right thigh. I think she needs formal venous reflux studies and consideration of central venous obstruction on the left. I am going to refer her to vein and vascular to see the surgeon in consultation with regards to both of the latter issues. If she does not have venous issues either correctable reflux or central venous disease and I think she is going to need thigh-high compression on the left side 6/30; patient returns to clinic  with wounds totally healed on her medial and lateral thigh. She has a lot of swelling in the left leg greater than the right. Known chronic venous insufficiency. For 1 reason or another we just do not seem to have been able to arrange reflux studies. I also had some concern about a central venous issue on the  left. She certainly may need thigh-high compression on the left side Electronic Signature(s) Signed: 11/29/2019 7:31:01 AM By: Linton Ham MD Entered By: Linton Ham on 11/23/2019 14:15:14 Susan [name] ([id number]) -------------------------------------------------------------------------------- Physical Exam Details Patient Name: Susan [name] Date of Service: 11/23/2019 2:15 PM Medical Record Number: [id number] Patient Account Number: [id number] Date of Birth/Sex: Dec 02, 1950 (69 y.o. F) Treating RN: Cornell Barman Primary Care Provider: Vic Blackbird Other Clinician: Referring Provider: Vic Blackbird Treating Provider/Extender: Ricard Dillon Weeks in Treatment: 3 Constitutional Sitting or standing Blood Pressure is within target range for patient.. Pulse regular and within target range for patient.Marland Kitchen Respirations regular, non- labored and within target range.. Temperature is normal and within the target range for the patient.Marland Kitchen appears in no distress. Notes Wound exam; left medial and left lateral thigh just above the knee totally healed. Asymmetric swelling in the left thigh. May be all lymphedema. She has her compression stockings that she has from review by Dr. Sharol Given on both legs Electronic Signature(s) Signed: 11/29/2019 7:31:01 AM By: Linton Ham MD Entered By: Linton Ham on 11/23/2019 14:16:36 Susan [name] ([id number]) -------------------------------------------------------------------------------- Physician Orders Details Patient Name: Susan [name] Date of Service: 11/23/2019 2:15 PM Medical Record Number: [id number] Patient Account Number: [id number] Date of Birth/Sex: 06/21/1950 (69 y.o. 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 L03.116 Cellulitis of left lower limb I50.30 Unspecified diastolic (congestive) heart failure E11.622 Type 2 diabetes mellitus with other skin ulcer Discharge From [facility] o Discharge from [place] - treatment complete Electronic Signature(s) Signed: 11/23/2019 4:50:05 PM By: [name], BSN, RN, CWS, Kim RN, BSN Signed: 11/29/2019 7:31:01 AM By: Linton Ham MD Entered By: [name], BSN, RN, CWS, Kim on 11/23/2019 16:50:04 Susan [name] ([id number]) -------------------------------------------------------------------------------- Problem List Details Patient Name: Susan [name] Date of Service: 11/23/2019 2:15 PM Medical Record Number: [id number] Patient Account Number: [id number] Date of Birth/Sex: May 30, 1950 (69 y.o. 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 11/02/2019 No Yes L03.116 Cellulitis of left lower limb 11/02/2019 No Yes I50.30 Unspecified diastolic (congestive) heart failure 11/02/2019 No Yes E11.622 Type 2 diabetes mellitus with other skin ulcer 11/02/2019 No Yes Inactive Problems Resolved Problems Electronic Signature(s) Signed: 11/29/2019 7:31:01 AM By: Linton Ham MD Entered By: Linton Ham on 11/23/2019 14:13:41 Susan [name]  ([id number]) -------------------------------------------------------------------------------- Progress Note Details Patient Name: Susan [name] Date of Service: 11/23/2019 2:15 PM Medical Record Number: [id number] Patient Account Number: [id number] Date of Birth/Sex: Nov 23, 1950 (69 y.o. F) Treating RN: [name],  [place] Primary Care Provider: Vic Blackbird Other Clinician: Referring Provider: Vic Blackbird Treating Provider/Extender: Tito Dine in Treatment: 3 Subjective History of Present Illness (HPI) ADMISSION 11/02/2019 This is a 69 year old woman who is a type II diabetic on insulin. She also has a history of chronic venous insufficiency in her lower extremities for which she is seeing Dr. Sharol Given in the past and wears compression stockings for recurrent edema and blistering in her lower extremities. She has done well. She feels that the lower extremity stockings push fluid up into her thighs and recently in the last 3 weeks she developed blister on her left posterior medial thigh which is opened into a wound. She was seen by her nephrologist apparently started on doxycycline. Seen her primary doctor yesterday who is Dr. [name] and put on Bactrim 1 tablet daily. She also has chronic renal failure stage IV and a history of diastolic congestive heart failure. She takes reasonably high doses of Demadex apparently. Past medical history; diastolic congestive heart failure, chronic kidney disease stage IV most recent creatinine ICU was 1.91, chronic venous insufficiency, osteoarthritis, coronary artery disease, hypothyroidism and type 2 diabetes on insulin with the most recent hemoglobin A1c we have available at 5.7 We did not do our standard ABI in our clinic because the wound was on her thigh however looking back through [facility] health link she did have arterial studies in 2019 at which time the ABI on the right was 1.21 with biphasic waveforms and a TBI of 0.72 on the left her ABI was 1.21  again with biphasic waveforms and a TBI of 0.65 at least in 2019 her blood flow seemed fairly normal. 6/16; patient has reflux studies on 6/21. Her husband states the dressing did not stay on very easily on her medial thigh however he purchased something that sounds like ABD pads and they eventually did stay in place. We have been using silver alginate. She did also develop a new wound on the posterior lateral thigh. not really sure how this happened. 6/23; the patient went for venous studies but seems to have ended up but heart and vascular in [place]. She did not have evidence of a DVT on either side. Noted to have a cystic structure in the popliteal fossa although I am not exactly sure what that is it was avascular question Baker's cyst. They recommended follow-up for a venous reflux study with vein and vascular although that is what we thought we were sending her for. The patient has closed the wound on the medial thigh which is the wound she came in with. She developed another wound last week on the lateral thigh that is very superficial and should be closed by next week. Patient has followed with Dr. Sharol Given for bilateral lower extremity stockings and venous disease. She has an enlarged left thigh versus the right thigh. I think she needs formal venous reflux studies and consideration of central venous obstruction on the left. I am going to refer her to vein and vascular to see the surgeon in consultation with regards to both of the latter issues. If she does not have venous issues either correctable reflux or central venous disease and I think she is going to need thigh-high compression on the left side 6/30; patient returns to clinic with wounds totally healed on her medial and lateral thigh. She has a lot of swelling in the left leg greater than the right. Known chronic venous insufficiency. For 1 reason or another we just do not  seem to have been able to arrange reflux studies. I also  had some concern about a central venous issue on the left. She certainly may need thigh-high compression on the left side Objective Constitutional Sitting or standing Blood Pressure is within target range for patient.. Pulse regular and within target range for patient.Marland Kitchen Respirations regular, non- labored and within target range.. Temperature is normal and within the target range for the patient.Marland Kitchen appears in no distress. Vitals Time Taken: 2:00 PM, Height: 65 in, Weight: 271 lbs, BMI: 45.1, Temperature: 97.9 F, Pulse: 69 bpm, Respiratory Rate: 18 breaths/min, Blood Pressure: 142/52 mmHg. General Notes: Wound exam; left medial and left lateral thigh just above the knee totally healed. Asymmetric swelling in the left thigh. May be all lymphedema. She has her compression stockings that she has from review by Dr. Sharol Given on both legs Integumentary (Hair, Skin) Wound #2 status is Healed - Epithelialized. Original cause of wound was Trauma. The wound is located on the Left,Lateral Upper Leg. The wound measures 0cm length x 0cm width x 0cm depth; 0cm^2 area and 0cm^3 volume. 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 The patient can be discharged from the clinic 2. We have tried to get venous reflux studies done on this patient. Particularly interested about the possibility of proximal venous hypertension on the left. For 1 reason or another we just do not seem to have gotten these arranged. Electronic Signature(s) Signed: 11/29/2019 7:31:01 AM By: Linton Ham MD Entered By: Linton Ham on 11/23/2019 14:17:37 Susan [name]  ([id number]) -------------------------------------------------------------------------------- SuperBill Details Patient Name: Susan [name] Date of Service: 11/23/2019 Medical Record Number: [id number] Patient Account Number: [id number] Date of Birth/Sex: 01-23-1951 (69 y.o. 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 L03.116 Cellulitis of left lower limb I50.30 Unspecified diastolic (congestive) heart failure E11.622 Type 2 diabetes mellitus with other skin ulcer Facility Procedures CPT4 Code: [id number] Description: 41740 - WOUND CARE VISIT-LEV 2 EST PT Modifier: Quantity: 1 Physician Procedures CPT4 Code Description: [id number] 56314 - WC PHYS LEVEL 2 - EST PT Modifier: Quantity: 1 CPT4 Code Description: ICD-10 Diagnosis Description I87.332 Chronic venous hypertension (idiopathic) with ulcer and inflammation of L97.121 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

## 2019-12-21 ENCOUNTER — Ambulatory Visit (HOSPITAL_COMMUNITY)
Admission: RE | Admit: 2019-12-21 | Discharge: 2019-12-21 | Disposition: A | Discharge status: Home / Self Care | Payer: PPO | Source: Ambulatory Visit | Attending: Internal Medicine | Admitting: Internal Medicine

## 2019-12-21 ENCOUNTER — Encounter (HOSPITAL_COMMUNITY): Payer: Self-pay

## 2019-12-21 ENCOUNTER — Other Ambulatory Visit: Payer: Self-pay

## 2019-12-21 VITALS — BP 134/62 | HR 80 | Wt 273.4 lb

## 2019-12-21 DIAGNOSIS — E118 Type 2 diabetes mellitus with unspecified complications: Secondary | ICD-10-CM | POA: Diagnosis not present

## 2019-12-21 DIAGNOSIS — Z87891 Personal history of nicotine dependence: Secondary | ICD-10-CM | POA: Insufficient documentation

## 2019-12-21 DIAGNOSIS — D649 Anemia, unspecified: Secondary | ICD-10-CM

## 2019-12-21 DIAGNOSIS — I2510 Atherosclerotic heart disease of native coronary artery without angina pectoris: Secondary | ICD-10-CM | POA: Insufficient documentation

## 2019-12-21 DIAGNOSIS — F329 Major depressive disorder, single episode, unspecified: Secondary | ICD-10-CM | POA: Diagnosis not present

## 2019-12-21 DIAGNOSIS — E1165 Type 2 diabetes mellitus with hyperglycemia: Secondary | ICD-10-CM

## 2019-12-21 DIAGNOSIS — F419 Anxiety disorder, unspecified: Secondary | ICD-10-CM | POA: Diagnosis not present

## 2019-12-21 DIAGNOSIS — Z560 Unemployment, unspecified: Secondary | ICD-10-CM | POA: Diagnosis not present

## 2019-12-21 DIAGNOSIS — I428 Other cardiomyopathies: Secondary | ICD-10-CM | POA: Insufficient documentation

## 2019-12-21 DIAGNOSIS — Z79899 Other long term (current) drug therapy: Secondary | ICD-10-CM | POA: Insufficient documentation

## 2019-12-21 DIAGNOSIS — Z8249 Family history of ischemic heart disease and other diseases of the circulatory system: Secondary | ICD-10-CM | POA: Insufficient documentation

## 2019-12-21 DIAGNOSIS — Z89421 Acquired absence of other right toe(s): Secondary | ICD-10-CM | POA: Diagnosis not present

## 2019-12-21 DIAGNOSIS — Z8673 Personal history of transient ischemic attack (TIA), and cerebral infarction without residual deficits: Secondary | ICD-10-CM | POA: Diagnosis not present

## 2019-12-21 DIAGNOSIS — Z7902 Long term (current) use of antithrombotics/antiplatelets: Secondary | ICD-10-CM | POA: Insufficient documentation

## 2019-12-21 DIAGNOSIS — Z794 Long term (current) use of insulin: Secondary | ICD-10-CM | POA: Insufficient documentation

## 2019-12-21 DIAGNOSIS — N184 Chronic kidney disease, stage 4 (severe): Secondary | ICD-10-CM | POA: Diagnosis not present

## 2019-12-21 DIAGNOSIS — I5032 Chronic diastolic (congestive) heart failure: Secondary | ICD-10-CM | POA: Diagnosis not present

## 2019-12-21 DIAGNOSIS — E1122 Type 2 diabetes mellitus with diabetic chronic kidney disease: Secondary | ICD-10-CM | POA: Diagnosis not present

## 2019-12-21 DIAGNOSIS — E1142 Type 2 diabetes mellitus with diabetic polyneuropathy: Secondary | ICD-10-CM | POA: Insufficient documentation

## 2019-12-21 DIAGNOSIS — Z955 Presence of coronary angioplasty implant and graft: Secondary | ICD-10-CM | POA: Diagnosis not present

## 2019-12-21 DIAGNOSIS — Z7982 Long term (current) use of aspirin: Secondary | ICD-10-CM | POA: Insufficient documentation

## 2019-12-21 DIAGNOSIS — Z89411 Acquired absence of right great toe: Secondary | ICD-10-CM | POA: Diagnosis not present

## 2019-12-21 DIAGNOSIS — Z87440 Personal history of urinary (tract) infections: Secondary | ICD-10-CM | POA: Insufficient documentation

## 2019-12-21 DIAGNOSIS — I5033 Acute on chronic diastolic (congestive) heart failure: Secondary | ICD-10-CM | POA: Insufficient documentation

## 2019-12-21 DIAGNOSIS — E039 Hypothyroidism, unspecified: Secondary | ICD-10-CM | POA: Insufficient documentation

## 2019-12-21 DIAGNOSIS — I130 Hypertensive heart and chronic kidney disease with heart failure and stage 1 through stage 4 chronic kidney disease, or unspecified chronic kidney disease: Secondary | ICD-10-CM | POA: Insufficient documentation

## 2019-12-21 DIAGNOSIS — Z885 Allergy status to narcotic agent status: Secondary | ICD-10-CM | POA: Insufficient documentation

## 2019-12-21 DIAGNOSIS — I252 Old myocardial infarction: Secondary | ICD-10-CM | POA: Insufficient documentation

## 2019-12-21 DIAGNOSIS — Z6841 Body Mass Index (BMI) 40.0 and over, adult: Secondary | ICD-10-CM | POA: Diagnosis not present

## 2019-12-21 DIAGNOSIS — E785 Hyperlipidemia, unspecified: Secondary | ICD-10-CM | POA: Insufficient documentation

## 2019-12-21 DIAGNOSIS — Z9114 Patient's other noncompliance with medication regimen: Secondary | ICD-10-CM | POA: Diagnosis not present

## 2019-12-21 DIAGNOSIS — Z9641 Presence of insulin pump (external) (internal): Secondary | ICD-10-CM | POA: Diagnosis not present

## 2019-12-21 LAB — CBC
HCT: 36.4 % (ref 36.0–46.0)
Hemoglobin: 11.2 g/dL — ABNORMAL LOW (ref 12.0–15.0)
MCH: 27 pg (ref 26.0–34.0)
MCHC: 30.8 g/dL (ref 30.0–36.0)
MCV: 87.7 fL (ref 80.0–100.0)
Platelets: 227 10*3/uL (ref 150–400)
RBC: 4.15 MIL/uL (ref 3.87–5.11)
RDW: 15.7 % — ABNORMAL HIGH (ref 11.5–15.5)
WBC: 6.8 10*3/uL (ref 4.0–10.5)
nRBC: 0 % (ref 0.0–0.2)

## 2019-12-21 LAB — COMPREHENSIVE METABOLIC PANEL
ALT: 18 U/L (ref 0–44)
AST: 22 U/L (ref 15–41)
Albumin: 2.8 g/dL — ABNORMAL LOW (ref 3.5–5.0)
Alkaline Phosphatase: 146 U/L — ABNORMAL HIGH (ref 38–126)
Anion gap: 12 (ref 5–15)
BUN: 44 mg/dL — ABNORMAL HIGH (ref 8–23)
CO2: 23 mmol/L (ref 22–32)
Calcium: 9 mg/dL (ref 8.9–10.3)
Chloride: 100 mmol/L (ref 98–111)
Creatinine, Ser: 2.18 mg/dL — ABNORMAL HIGH (ref 0.44–1.00)
GFR calc Af Amer: 26 mL/min — ABNORMAL LOW (ref >60)
GFR calc non Af Amer: 22 mL/min — ABNORMAL LOW (ref >60)
Glucose, Bld: 377 mg/dL — ABNORMAL HIGH (ref 70–99)
Potassium: 4.1 mmol/L (ref 3.5–5.1)
Sodium: 135 mmol/L (ref 135–145)
Total Bilirubin: 0.5 mg/dL (ref 0.3–1.2)
Total Protein: 5.6 g/dL — ABNORMAL LOW (ref 6.5–8.1)

## 2019-12-21 LAB — HEMOGLOBIN A1C
Hgb A1c MFr Bld: 11.8 % — ABNORMAL HIGH (ref 4.8–5.6)
Mean Plasma Glucose: 291.96 mg/dL

## 2019-12-21 LAB — LIPID PANEL
Cholesterol: 242 mg/dL — ABNORMAL HIGH (ref 0–200)
HDL: 52 mg/dL
LDL Cholesterol: 130 mg/dL — ABNORMAL HIGH (ref 0–99)
Total CHOL/HDL Ratio: 4.7 ratio
Triglycerides: 299 mg/dL — ABNORMAL HIGH
VLDL: 60 mg/dL — ABNORMAL HIGH (ref 0–40)

## 2019-12-21 LAB — BRAIN NATRIURETIC PEPTIDE: B Natriuretic Peptide: 219.2 pg/mL — ABNORMAL HIGH (ref 0.0–100.0)

## 2019-12-21 MED ORDER — METOLAZONE 2.5 MG PO TABS: 2.5000 mg | ORAL_TABLET | ORAL | 1 refills | Status: DC

## 2019-12-21 MED ORDER — POTASSIUM CHLORIDE CRYS ER 20 MEQ PO TBCR
40.0000 meq | EXTENDED_RELEASE_TABLET | ORAL | 1 refills | Status: DC
Start: 2019-12-21 — End: 2020-02-10

## 2019-12-21 NOTE — Progress Notes (Signed)
ReDS Vest / Clip - 12/21/19 1519      ReDS Vest / Clip   Station Marker B    Ruler Value 41    ReDS Value Range Moderate volume overload    ReDS Actual Value 33

## 2019-12-21 NOTE — Patient Instructions (Signed)
Lab work done today. We will notify you of any abnormal lab work. No news is good news!  Lab work will need to be done again in 10-14 days.    RESTART Metolazone 2.5mg  tab weekly. On days that you take Metolazone also take Potassium 49meq (2 tabs).  Please follow up with the [facility] in 6 weeks.  At the [facility], you and your health needs are our priority. As part of our continuing mission to provide you with exceptional heart care, we have created designated Provider Care Teams. These Care Teams include your primary Cardiologist (physician) and Advanced Practice Providers (APPs- Physician Assistants and Nurse Practitioners) who all work together to provide you with the care you need, when you need it.   You may see any of the following providers on your designated Care Team at your next follow up: Marland Kitchen Dr Glori Bickers . Dr Loralie Champagne . Darrick Grinder, NP . Lyda Jester, PA . Audry Riles, PharmD   Please be sure to bring in all your medications bottles to every appointment.

## 2019-12-21 NOTE — Progress Notes (Signed)
12/21/2019 Susan [name]   1951-05-24  [id number]  Primary Physician [name] [name], MD Primary Cardiologist: Glori Bickers, MD  Electrophysiologist: None   Reason for Visit: F/u for chronic diastolic heart failure   HPI:  Susan [name] is a 69 y.o. female who is being seen today for f/u for chronic diastolic HF.   PMH includes RBBB, DM, Hypothyroidish, diastolic heart failure, CAD MI 2000/2007 BMS 2007, TIA, obesity, CKD Stage IV and  Rt transmetatarsal amputation.   Admitted June 1st through June 3rd, 2017 with altered mental status, hypothermia, and weakness. Diuretics initially held and later restarted.   Admitted 2/27-07/28/17 for AKI. Torsemide was held and restarted at 60 mg BID. She had RHC 07/24/17 that showed mild PAH with evidence of RV strain likely due to OHS/OSA. She was discharged to SNF.   Admitted 0/9735 with A/C diastolic HF. Diuresed with IV lasix.   Admitted 10/24-10/27/19 with syncope. She was orthostatic. Had AKI with creatinine up to 3.33 and required IVF. Renal US showed no hydronephrosis. Losartan and Imdur were DC'd. Creatinine 1.76 on day of DC.   Was admitted 1/21 for confusion and abdominal pain. Found to have Klebsiella pneumoniae UTI treated w/ abx. Also found to have elevated LFTs. CT of abdomen showed cholelithiasis. MRCP showed cholelithiasis without evidence of cholecystitis or choledocholithiasis. Ultrasound of abdomen with Doppler showed no acute finding.NO concern for cholangitis. All viral serologies werw negative. Her statin (Crestor) was discontinued. She had outpatient GI f/u and continued working including a liver biopsy 08/11/19. No definite pathologic fibrosis noted in pathology report.   Since her last [facility] f/u in March, her home diuretic regimen has been adjusted given rise in SCr. She was previously taking metolazone 2 days a week, in combination w/ daily bid torsemide for volume control. In May, her SCr rose to 3.0. Metolazone was  reduced from twice to once weekly. F/u CMPs have shown downward trend in SCr down to 1.9 on most recent labs.   She presents back to clinic today for routien f/u. She has had some progressive wt gain since her last visit here in the clinic, wt has increased from 258 lb in March to 273 lb today. She admits that she has not taken any metolazone in several weeks. Has deliberately avoided taking due to inconvience of frequent urination and busy schedule of daily errands and appointments. She notes chronic exertional dyspnea, not significantly worse that usual baseline but has noticed recent orthopnea/PND, nocturnal cough and abdominal fullness. She has been fully compliant w/ torsemide. She denies any chest pain. ReDs clip only 33% but abdomen edematous.      Current Meds  Medication Sig  . albuterol (VENTOLIN HFA) 108 (90 Base) MCG/ACT inhaler TAKE 2 PUFFS BY MOUTH EVERY 6 HOURS AS NEEDED FOR WHEEZE OR SHORTNESS OF BREATH (Patient taking differently: Inhale 2 puffs into the lungs every 6 (six) hours as needed for wheezing or shortness of breath. )  . allopurinol (ZYLOPRIM) 100 MG tablet TAKE 1 TABLET BY MOUTH TWICE A DAY (Patient taking differently: Take 100 mg by mouth 2 (two) times daily. )  . Ascorbic Acid (VITAMIN C) 1000 MG tablet Take 1,000 mg by mouth daily.  Marland Kitchen aspirin EC 81 MG tablet Take 81 mg by mouth every morning.   . carvedilol (COREG) 12.5 MG tablet TAKE 1 TABLET BY MOUTH TWICE A DAY WITH MEALS  . cholecalciferol (VITAMIN D) 1000 units tablet Take 1,000 Units by mouth daily with supper.   Marland Kitchen  clopidogrel (PLAVIX) 75 MG tablet TAKE 1 TABLET BY MOUTH EVERY DAY WITH BREAKFAST (Patient taking differently: Take 75 mg by mouth daily. )  . colchicine 0.6 MG tablet Take 1 tablet (0.6 mg total) by mouth daily. As needed for gout flare  . escitalopram (LEXAPRO) 20 MG tablet TAKE 1 TABLET BY MOUTH EVERY DAY (Patient taking differently: Take 20 mg by mouth daily. )  . ferrous sulfate 325 (65 FE) MG  tablet Take 1 tablet (325 mg total) by mouth 2 (two) times daily with a meal. Reported on 08/21/2015  . HYDROcodone-acetaminophen (NORCO) 5-325 MG tablet Take 1 tablet by mouth every 8 (eight) hours as needed for moderate pain.  . Insulin Disposable Pump (OMNIPOD DASH 5 PACK PODS) MISC Inject into the skin. Use continuously with Novolin R - change every 72 hours.  . insulin NPH Human (HUMULIN N,NOVOLIN N) 100 UNIT/ML injection Inject 20 Units into the skin See admin instructions. Inject 20 units subcutaneously in the morning if CBG >200  . insulin regular (NOVOLIN R) 100 units/mL injection Inject 7-17 Units into the skin 3 (three) times daily before meals. Manually add bolus to continuous dose via OmniPod 3 times daily per sliding scale: CBG 80-150 7 units, 151-200 9 units, 201-250 12 units, 251-300 14 units, 301-400 17 units.  Marland Kitchen levothyroxine (SYNTHROID, LEVOTHROID) 50 MCG tablet Take 1 tablet (50 mcg total) by mouth daily before breakfast.  . magnesium oxide (MAG-OX) 400 MG tablet TAKE 1 TABLET BY MOUTH EVERY DAY (Patient taking differently: Take 400 mg by mouth daily. )  . Multiple Vitamin (MULTIVITAMIN WITH MINERALS) TABS tablet Take 1 tablet by mouth daily.  . nitroGLYCERIN (NITROSTAT) 0.4 MG SL tablet Place 1 tablet (0.4 mg total) under the tongue every 5 (five) minutes as needed for chest pain.  Marland Kitchen nystatin cream (MYCOSTATIN) Apply 1 application topically 2 (two) times daily. External vaginal area  . ONETOUCH VERIO test strip   . pregabalin (LYRICA) 150 MG capsule Take 1 capsule (150 mg total) by mouth 2 (two) times daily.  Marland Kitchen torsemide (DEMADEX) 20 MG tablet Take 80 mg by mouth 2 (two) times daily.  . traZODone (DESYREL) 100 MG tablet Take 1.5 tablets (150 mg total) by mouth at bedtime as needed for sleep.  . ursodiol (ACTIGALL) 500 MG tablet Take 500 mg by mouth 3 (three) times daily.  . vitamin E (VITAMIN E) 1000 UNIT capsule Take 1,000 Units by mouth daily with supper.    Allergies    Allergen Reactions  . Codeine Nausea And Vomiting   Past Medical History:  Diagnosis Date  . Acute MI ([place]) 1999; 2007  . Anemia    hx  . Anginal pain ([place])   . Anxiety   . ARF (acute renal failure) ([place]) 06/2017   [facility]  . Arthritis    "generalized" (03/15/2014)  . CAD (coronary artery disease)    MI in 2000 - MI  2007 - treated bare metal stent (no nuclear since then as 9/11)  . Carotid artery disease ([place])   . CHF (congestive heart failure) ([place])   . Chronic diastolic heart failure (HCC)    a) ECHO (08/2013) EF 55-60% and RV function nl b) RHC (08/2013) RA 4, RV 30/5/7, PA 25/10 (16), PCWP 7, Fick CO/CI 6.3/2.7, PVR 1.5 WU, PA 61 and 66%  . Daily headache    "~ every other day; since I fell in June" (03/15/2014)  . Depression   . Dyslipidemia   . Dyspnea   .  Exertional shortness of breath   . HTN (hypertension)   . Hypothyroidism   . Neuropathy   . Obesity   . Osteoarthritis   . Peripheral neuropathy   . PONV (postoperative nausea and vomiting)   . RBBB (right bundle branch block)    Old  . Stroke [facility])    mini strokes  . Syncope    likely due to low blood sugar  . Tachycardia    Sinus tachycardia  . Type II diabetes mellitus (HCC)    Type II  . Urinary incontinence   . Venous insufficiency    Family History  Problem Relation Age of Onset  . Heart attack Mother 27   Past Surgical History:  Procedure Laterality Date  . ABDOMINAL HYSTERECTOMY  1980's  . AMPUTATION Right 02/24/2018   Procedure: RIGHT FOOT GREAT TOE AND 2ND TOE AMPUTATION;  Surgeon: Newt Minion, MD;  Location: [place];  Service: Orthopedics;  Laterality: Right;  . AMPUTATION Right 04/30/2018   Procedure: RIGHT TRANSMETATARSAL AMPUTATION;  Surgeon: Newt Minion, MD;  Location: [place];  Service: Orthopedics;  Laterality: Right;  . CATARACT EXTRACTION, BILATERAL Bilateral ?2013  . COLONOSCOPY W/ POLYPECTOMY    . COLONOSCOPY WITH PROPOFOL N/A 03/13/2019   Procedure: COLONOSCOPY  WITH PROPOFOL;  Surgeon: Jerene Bears, MD;  Location: [place];  Service: Gastroenterology;  Laterality: N/A;  . [place]; 2007   "1 + 1"  . ESOPHAGOGASTRODUODENOSCOPY (EGD) WITH PROPOFOL N/A 03/13/2019   Procedure: ESOPHAGOGASTRODUODENOSCOPY (EGD) WITH PROPOFOL;  Surgeon: Jerene Bears, MD;  Location[facility] ENDOSCOPY;  Service: Gastroenterology;  Laterality: N/A;  . EYE SURGERY Bilateral    [place]  . HEMOSTASIS CLIP PLACEMENT  03/13/2019   Procedure: HEMOSTASIS CLIP PLACEMENT;  Surgeon: Jerene Bears, MD;  Location: [facility] ENDOSCOPY;  Service: Gastroenterology;;  . KNEE ARTHROSCOPY Left 10/25/2006  . POLYPECTOMY  03/13/2019   Procedure: POLYPECTOMY;  Surgeon: Jerene Bears, MD;  Location: [place];  Service: Gastroenterology;;  . RIGHT HEART CATH N/A 07/24/2017   Procedure: RIGHT HEART CATH;  Surgeon: Jolaine Artist, MD;  Location: [place] CV LAB;  Service: Cardiovascular;  Laterality: N/A;  . RIGHT HEART CATHETERIZATION N/A 09/22/2013   Procedure: RIGHT HEART CATH;  Surgeon: Jolaine Artist, MD;  Location: [facility] CATH LAB;  Service: Cardiovascular;  Laterality: N/A;  . SHOULDER ARTHROSCOPY WITH OPEN ROTATOR CUFF REPAIR Right 03/14/2014   Procedure: RIGHT SHOULDER ARTHROSCOPY WITH BICEPS RELEASE, OPEN SUBSCAPULA REPAIR, OPEN SUPRASPINATUS REPAIR.;  Surgeon: Meredith Pel, MD;  Location: [place];  Service: Orthopedics;  Laterality: Right;  . TOE AMPUTATION Right 02/24/2018   GREAT TOE AND 2ND TOE AMPUTATION  . TUBAL LIGATION  1970's   Social History   Socioeconomic History  . Marital status: Married    Spouse name: Not on file  . Number of children: 3  . Years of education: 12th  . Highest education level: Not on file  Occupational History    Employer: UNEMPLOYED  Tobacco Use  . Smoking status: Former Smoker    Packs/day: 3.00    Years: 32.00    Pack years: 96.00    Types: Cigarettes    Quit date: 10/24/1997    Years since quitting:  22.1  . Smokeless tobacco: Never Used  Vaping Use  . Vaping Use: Never used  Substance and Sexual Activity  . Alcohol use: Not Currently    Comment: "might have 2-3 daiquiris in the summer"  . Drug  use: No  . Sexual activity: Never  Other Topics Concern  . Not on file  Social History Narrative   Pt lives at home with her spouse.   Caffeine Use- 3 sodas daily   Social Determinants of Health   Financial Resource Strain:   . Difficulty of Paying Living Expenses:   Food Insecurity:   . Worried About Charity fundraiser in the Last Year:   . Arboriculturist in the Last Year:   Transportation Needs: No Transportation Needs  . Lack of Transportation (Medical): No  . Lack of Transportation (Non-Medical): No  Physical Activity: Inactive  . Days of Exercise per Week: 0 days  . Minutes of Exercise per Session: 10 min  Stress:   . Feeling of Stress :   Social Connections:   . Frequency of Communication with Friends and Family:   . Frequency of Social Gatherings with Friends and Family:   . Attends Religious Services:   . Active Member of Clubs or Organizations:   . Attends Archivist Meetings:   Marland Kitchen Marital Status:   Intimate Partner Violence:   . Fear of Current or Ex-Partner:   . Emotionally Abused:   Marland Kitchen Physically Abused:   . Sexually Abused:      Lipid Panel     Component Value Date/Time   CHOL 148 01/17/2019 1608   TRIG 244 (H) 01/17/2019 1608   HDL 53 01/17/2019 1608   CHOLHDL 2.8 01/17/2019 1608   VLDL 33 07/24/2017 0528   LDLCALC 65 01/17/2019 1608    Blood pressure (!) 134/62, pulse 80, weight (!) 124 kg (273 lb 6.4 oz), SpO2 95 %.  Wt Readings from Last 3 Encounters:  12/21/19 (!) 124 kg (273 lb 6.4 oz)  11/24/19 121.1 kg (267 lb)  11/01/19 121.1 kg (267 lb)   PHYSICAL EXAM: ReDs Clip 33% General:  Obese female. No respiratory difficulty HEENT: normal Neck: supple. no JVD. Carotids 2+ bilat; no bruits. No lymphadenopathy or thyromegaly  appreciated. Cor: PMI nondisplaced. Regular rate & rhythm. No rubs, gallops or murmurs. Lungs: clear Abdomen: obese, soft, nontender, nondistended. No hepatosplenomegaly. No bruits or masses. Good bowel sounds. Extremities: no cyanosis, clubbing, rash, trace- 1 + bilateral LE edema Neuro: alert & oriented x 3, cranial nerves grossly intact. moves all 4 extremities w/o difficulty. Affect pleasant.    ASSESSMENT AND PLAN:   1. Acute on Chronic Diastolic Heart Failure: NICM, Echo 06/2017: EF 55-60%, grade 1 DD - Echo 03/19/18: EF 55-60%, grade 1 DD - Chronically, NYHA III, confounded by morbid obesity and deconditioning (limited mobility/ balance issues since transmetatarsal amputation) - She has had progressive wt gain and development of orthopnea/PND, nocturnal cough and abdominal fullness in the setting of poor adherence to diuretic regimen.  - Continue torsemide 80 mg twice a day. Restart metolazone, 2.5 mg. Advised to take 2 doses this week (tomorrow and Saturday). Next week, return to once weekly schedule. Advised to take 40 mEq of KCl on metolazone days.  - Check BMP and BNP today and again in 7 days  - Discussed importance of strict med adherence,  low sodium diet and fluid restriction      2.  Uncontrolled DM:  - most recent hgb A1c on file was 13.1 in 2020 - Follows closely with Dr Bubba Camp. Has an insulin pump. - ? Potential SGLT2i in the future if DM better controlled, will repeat hgb a1c today to reasess, but would need to consult w/ Dr. Bubba Camp  prior to initiation. Would avoid use if  hgb a1c is still > 10.     3. CAD: history of CAD with BMS in 2007.  Low risk nuc study in 2018 - She denies any recent anginal symptomatology  - Continue DAPT w/ ASA + Plavix. Check CBC today  - Continue  blocker therapy w/ Coreg  - off statin due to previously elevated LFTs. Labs reviewed. Most recent CMP 5/21 showed normalization of LFTs - Plan to check lipid panel today and consider restart of  low dose statin vs PSCK9i, if LDL not at goal of < 70 mg/dL.   4. Obesity: - Body mass index is 44.13 kg/m. - wt loss advised. Unfortunately mobility is limited by poor blance   6. HTN:  - mildly elevated, but in the setting of fluid overload - continue current regimen w/ planned escalation  of diuretic regimen as outlined above.  - check BMP today   7. CKD Stage III - Followed by Dr Hollie Salk.  - last BMP w/ SCr 1.9  - Check CMP today   8. Day time fatigue - suspicion for sleep apnea. Sleep study previously recommended but she refused.  I discussed recommendations for sleep study again today and she is still adamant that she dose not want one.   9.  S/p right transmetatarsal amputation - Followed by Dr Sharol Given.    Repeat BMP and BNP in 7 days. F/u in 4 w/ provider to reassess volume status.   Nelida Gores [facility] 12/21/2019 3:18 PM

## 2019-12-22 ENCOUNTER — Telehealth (HOSPITAL_COMMUNITY): Payer: Self-pay | Admitting: Cardiology

## 2019-12-22 DIAGNOSIS — E782 Mixed hyperlipidemia: Secondary | ICD-10-CM

## 2019-12-22 NOTE — Telephone Encounter (Signed)
-----   Message from Consuelo Pandy, [place] sent at 12/22/2019  1:55 PM EDT ----- Labs stable. Continue w/ med changes made yesterday w/ diuretic regimen. Needs repeat BMP and BNP in 1 week.  Her LDL (bad cholesterol) is also high at 130 (should be less than 70 w/ CAD). Unable to take statins due to history of abnormal liver function test. Refer to lipid clinic for PCSK9i therapy.

## 2019-12-23 ENCOUNTER — Other Ambulatory Visit: Payer: Self-pay

## 2019-12-23 DIAGNOSIS — M7989 Other specified soft tissue disorders: Secondary | ICD-10-CM

## 2019-12-27 ENCOUNTER — Encounter (HOSPITAL_COMMUNITY): Payer: Self-pay | Admitting: Cardiology

## 2020-01-02 ENCOUNTER — Telehealth: Payer: Self-pay | Admitting: Family Medicine

## 2020-01-02 NOTE — Progress Notes (Signed)
  Chronic Care Management   Outreach Note  01/02/2020 Name: Susan [name] MRN: [id number] DOB: 1950/12/21  Referred by: Alycia Rossetti, MD Reason for referral : No chief complaint on file.   An unsuccessful telephone outreach was attempted today. The patient was referred to the pharmacist for assistance with care management and care coordination.   Follow Up Plan:   Carley Perdue [employer] Scheduler

## 2020-01-03 NOTE — Telephone Encounter (Signed)
Pt left VM returning call I called pt back at [phone] and received a busy tone. I called pt on her mobile phone [phone] and left detailed message requesting a return call that she received and understood my voice message. Referral placed to lipid clinic 12/30/19.

## 2020-01-04 ENCOUNTER — Telehealth: Payer: Self-pay | Admitting: Family Medicine

## 2020-01-04 ENCOUNTER — Other Ambulatory Visit (HOSPITAL_COMMUNITY): Payer: Self-pay | Admitting: Adult Health

## 2020-01-04 ENCOUNTER — Other Ambulatory Visit (HOSPITAL_COMMUNITY): Payer: PPO

## 2020-01-04 NOTE — Progress Notes (Signed)
  Chronic Care Management   Note  01/04/2020 Name: Susan [name] MRN: [id number] DOB: [birth year]  [name] is a 69 y.o. year old female who is a primary care patient of [place], [name], MD. I reached out to [name] by phone today in response to a referral sent by Ms. Vernell Barrier PCP, [name], [name], MD.   [name] was given information about Chronic Care Management services today including:  1. CCM service includes personalized support from designated clinical staff supervised by her physician, including individualized plan of care and coordination with other care providers 2. 24/7 contact phone numbers for assistance for urgent and routine care needs. 3. Service will only be billed when office clinical staff spend 20 minutes or more in a month to coordinate care. 4. Only one practitioner may furnish and bill the service in a calendar month. 5. The patient may stop CCM services at any time (effective at the end of the month) by phone call to the office staff.   Patient wishes to consider information provided and/or speak with a member of the care team before deciding about enrollment in care management services.   Follow up plan:   Carley Perdue [employer] Scheduler

## 2020-01-05 ENCOUNTER — Encounter: Payer: PPO | Admitting: Vascular Surgery

## 2020-01-05 ENCOUNTER — Encounter (HOSPITAL_COMMUNITY): Payer: PPO

## 2020-01-18 ENCOUNTER — Other Ambulatory Visit (HOSPITAL_COMMUNITY): Payer: PPO

## 2020-01-24 ENCOUNTER — Telehealth (HOSPITAL_COMMUNITY): Payer: Self-pay | Admitting: *Deleted

## 2020-01-24 DIAGNOSIS — D509 Iron deficiency anemia, unspecified: Secondary | ICD-10-CM | POA: Diagnosis not present

## 2020-01-24 DIAGNOSIS — R194 Change in bowel habit: Secondary | ICD-10-CM | POA: Diagnosis not present

## 2020-01-24 DIAGNOSIS — K5730 Diverticulosis of large intestine without perforation or abscess without bleeding: Secondary | ICD-10-CM | POA: Diagnosis not present

## 2020-01-24 DIAGNOSIS — K8309 Other cholangitis: Secondary | ICD-10-CM | POA: Diagnosis not present

## 2020-01-24 NOTE — Telephone Encounter (Signed)
Pt left vm to confirm appt I called pt back no answer/left vm to return call.

## 2020-01-26 ENCOUNTER — Other Ambulatory Visit: Payer: Self-pay | Admitting: Family Medicine

## 2020-01-31 ENCOUNTER — Other Ambulatory Visit: Payer: Self-pay

## 2020-01-31 ENCOUNTER — Ambulatory Visit (HOSPITAL_COMMUNITY)
Admission: RE | Admit: 2020-01-31 | Discharge: 2020-01-31 | Disposition: A | Discharge status: Home / Self Care | Payer: PPO | Source: Ambulatory Visit | Attending: Adult Health | Admitting: Adult Health

## 2020-01-31 ENCOUNTER — Encounter (HOSPITAL_COMMUNITY): Payer: Self-pay

## 2020-01-31 VITALS — BP 106/60 | HR 80 | Ht 66.0 in | Wt 271.0 lb

## 2020-01-31 DIAGNOSIS — E1122 Type 2 diabetes mellitus with diabetic chronic kidney disease: Secondary | ICD-10-CM | POA: Insufficient documentation

## 2020-01-31 DIAGNOSIS — E669 Obesity, unspecified: Secondary | ICD-10-CM | POA: Insufficient documentation

## 2020-01-31 DIAGNOSIS — Z79899 Other long term (current) drug therapy: Secondary | ICD-10-CM | POA: Diagnosis not present

## 2020-01-31 DIAGNOSIS — Z7982 Long term (current) use of aspirin: Secondary | ICD-10-CM | POA: Diagnosis not present

## 2020-01-31 DIAGNOSIS — Z885 Allergy status to narcotic agent status: Secondary | ICD-10-CM | POA: Insufficient documentation

## 2020-01-31 DIAGNOSIS — D649 Anemia, unspecified: Secondary | ICD-10-CM | POA: Diagnosis not present

## 2020-01-31 DIAGNOSIS — Z9641 Presence of insulin pump (external) (internal): Secondary | ICD-10-CM | POA: Diagnosis not present

## 2020-01-31 DIAGNOSIS — F329 Major depressive disorder, single episode, unspecified: Secondary | ICD-10-CM | POA: Diagnosis not present

## 2020-01-31 DIAGNOSIS — F419 Anxiety disorder, unspecified: Secondary | ICD-10-CM | POA: Diagnosis not present

## 2020-01-31 DIAGNOSIS — E1142 Type 2 diabetes mellitus with diabetic polyneuropathy: Secondary | ICD-10-CM | POA: Insufficient documentation

## 2020-01-31 DIAGNOSIS — I252 Old myocardial infarction: Secondary | ICD-10-CM | POA: Diagnosis not present

## 2020-01-31 DIAGNOSIS — I130 Hypertensive heart and chronic kidney disease with heart failure and stage 1 through stage 4 chronic kidney disease, or unspecified chronic kidney disease: Secondary | ICD-10-CM | POA: Insufficient documentation

## 2020-01-31 DIAGNOSIS — I428 Other cardiomyopathies: Secondary | ICD-10-CM | POA: Diagnosis not present

## 2020-01-31 DIAGNOSIS — N183 Chronic kidney disease, stage 3 unspecified: Secondary | ICD-10-CM | POA: Diagnosis not present

## 2020-01-31 DIAGNOSIS — E785 Hyperlipidemia, unspecified: Secondary | ICD-10-CM | POA: Insufficient documentation

## 2020-01-31 DIAGNOSIS — Z955 Presence of coronary angioplasty implant and graft: Secondary | ICD-10-CM | POA: Insufficient documentation

## 2020-01-31 DIAGNOSIS — E118 Type 2 diabetes mellitus with unspecified complications: Secondary | ICD-10-CM | POA: Insufficient documentation

## 2020-01-31 DIAGNOSIS — R0602 Shortness of breath: Secondary | ICD-10-CM

## 2020-01-31 DIAGNOSIS — Z89421 Acquired absence of other right toe(s): Secondary | ICD-10-CM | POA: Diagnosis not present

## 2020-01-31 DIAGNOSIS — Z794 Long term (current) use of insulin: Secondary | ICD-10-CM | POA: Insufficient documentation

## 2020-01-31 DIAGNOSIS — E039 Hypothyroidism, unspecified: Secondary | ICD-10-CM | POA: Insufficient documentation

## 2020-01-31 DIAGNOSIS — Z7902 Long term (current) use of antithrombotics/antiplatelets: Secondary | ICD-10-CM | POA: Diagnosis not present

## 2020-01-31 DIAGNOSIS — I5032 Chronic diastolic (congestive) heart failure: Secondary | ICD-10-CM | POA: Insufficient documentation

## 2020-01-31 DIAGNOSIS — Z6841 Body Mass Index (BMI) 40.0 and over, adult: Secondary | ICD-10-CM | POA: Insufficient documentation

## 2020-01-31 DIAGNOSIS — Z8673 Personal history of transient ischemic attack (TIA), and cerebral infarction without residual deficits: Secondary | ICD-10-CM | POA: Insufficient documentation

## 2020-01-31 DIAGNOSIS — Z87891 Personal history of nicotine dependence: Secondary | ICD-10-CM | POA: Insufficient documentation

## 2020-01-31 DIAGNOSIS — I2510 Atherosclerotic heart disease of native coronary artery without angina pectoris: Secondary | ICD-10-CM | POA: Diagnosis not present

## 2020-01-31 LAB — CBC
HCT: 36 % (ref 36.0–46.0)
Hemoglobin: 10.9 g/dL — ABNORMAL LOW (ref 12.0–15.0)
MCH: 26 pg (ref 26.0–34.0)
MCHC: 30.3 g/dL (ref 30.0–36.0)
MCV: 85.9 fL (ref 80.0–100.0)
Platelets: 207 10*3/uL (ref 150–400)
RBC: 4.19 MIL/uL (ref 3.87–5.11)
RDW: 14.9 % (ref 11.5–15.5)
WBC: 9 10*3/uL (ref 4.0–10.5)
nRBC: 0 % (ref 0.0–0.2)

## 2020-01-31 LAB — BASIC METABOLIC PANEL
Anion gap: 12 (ref 5–15)
BUN: 55 mg/dL — ABNORMAL HIGH (ref 8–23)
CO2: 24 mmol/L (ref 22–32)
Calcium: 8.6 mg/dL — ABNORMAL LOW (ref 8.9–10.3)
Chloride: 99 mmol/L (ref 98–111)
Creatinine, Ser: 2.62 mg/dL — ABNORMAL HIGH (ref 0.44–1.00)
GFR calc Af Amer: 21 mL/min — ABNORMAL LOW (ref >60)
GFR calc non Af Amer: 18 mL/min — ABNORMAL LOW (ref >60)
Glucose, Bld: 391 mg/dL — ABNORMAL HIGH (ref 70–99)
Potassium: 3.3 mmol/L — ABNORMAL LOW (ref 3.5–5.1)
Sodium: 135 mmol/L (ref 135–145)

## 2020-01-31 NOTE — Patient Instructions (Signed)
Routine lab work today. Will notify you of abnormal results  Follow up in 3 months  

## 2020-01-31 NOTE — Progress Notes (Signed)
01/31/2020 [name]   [birth year]  [id number]  Primary Physician Jeanice Lim Velna Hatchet, MD Primary Cardiologist: Arvilla Meres, MD  Electrophysiologist: None   Reason for Visit:  Chronic Diastolic Heart Failure.   HPI:  Susan [name] is a 69 y.o. female with a history of RBBB, DM, Hypothyroidish, diastolic heart failure, CAD MI 2000/2007 BMS 2007, TIA, obesity, CKD Stage IV and  Rt transmetatarsal amputation.   Admitted June 1st through June 3rd, 2017 with altered mental status, hypothermia, and weakness. Diuretics initially held and later restarted.   Admitted 2/27-07/28/17 for AKI. Torsemide was held and restarted at 60 mg BID. She had RHC 07/24/17 that showed mild PAH with evidence of RV strain likely due to OHS/OSA. She was discharged to SNF.   Admitted 01/2018 with A/C diastolic HF. Diuresed with IV lasix.   Admitted 10/24-10/27/19 with syncope. She was orthostatic. Had AKI with creatinine up to 3.33 and required IVF. Renal US showed no hydronephrosis. Losartan and Imdur were DC'd. Creatinine 1.76 on day of DC.   Was admitted 1/21 for confusion and abdominal pain. Found to have Klebsiella pneumoniae UTI treated w/ abx. Also found to have elevated LFTs. CT of abdomen showed cholelithiasis. MRCP showed cholelithiasis without evidence of cholecystitis or choledocholithiasis. Ultrasound of abdomen with Doppler showed no acute finding.NO concern for cholangitis. All viral serologies werw negative. Her statin (Crestor) was discontinued. She had outpatient GI f/u and continued working including a liver biopsy 08/11/19. No definite pathologic fibrosis noted in pathology report.   Since her last [name] f/u in March, her home diuretic regimen has been adjusted given rise in SCr. She was previously taking metolazone 2 days a week, in combination w/ daily bid torsemide for volume control. In May, her SCr rose to 3.0. Metolazone was reduced from twice to once weekly. F/u CMPs have shown downward  trend in SCr down to 1.9 on most recent labs.   Today she returns for HF follow up. Overall feeling fine. Remains SOB with exertion. Denies PND/Orthopnea. Had BRBPR over the last week. She thinks its from diarrhea. Appetite ok. No fever or chills. She does not weigh at home. Taking all medications.    Current Meds  Medication Sig  . albuterol (VENTOLIN HFA) 108 (90 Base) MCG/ACT inhaler TAKE 2 PUFFS BY MOUTH EVERY 6 HOURS AS NEEDED FOR WHEEZE OR SHORTNESS OF BREATH (Patient taking differently: Inhale 2 puffs into the lungs every 6 (six) hours as needed for wheezing or shortness of breath. )  . allopurinol (ZYLOPRIM) 100 MG tablet TAKE 1 TABLET BY MOUTH TWICE A DAY (Patient taking differently: Take 100 mg by mouth 2 (two) times daily. )  . Ascorbic Acid (VITAMIN C) 1000 MG tablet Take 1,000 mg by mouth daily.  Marland Kitchen aspirin EC 81 MG tablet Take 81 mg by mouth every morning.   . carvedilol (COREG) 12.5 MG tablet TAKE 1 TABLET BY MOUTH TWICE A DAY WITH MEALS  . cholecalciferol (VITAMIN D) 1000 units tablet Take 1,000 Units by mouth daily with supper.   . clopidogrel (PLAVIX) 75 MG tablet TAKE 1 TABLET BY MOUTH EVERY DAY WITH BREAKFAST (Patient taking differently: Take 75 mg by mouth daily. )  . escitalopram (LEXAPRO) 20 MG tablet TAKE 1 TABLET BY MOUTH EVERY DAY (Patient taking differently: Take 20 mg by mouth daily. )  . ferrous sulfate 325 (65 FE) MG tablet Take 1 tablet (325 mg total) by mouth 2 (two) times daily with a meal. Reported on 08/21/2015  .  Insulin Disposable Pump (OMNIPOD DASH 5 PACK PODS) MISC Inject into the skin. Use continuously with Novolin R - change every 72 hours.  . insulin NPH Human (HUMULIN N,NOVOLIN N) 100 UNIT/ML injection Inject 20 Units into the skin See admin instructions. Inject 20 units subcutaneously in the morning if CBG >200  . insulin regular (NOVOLIN R) 100 units/mL injection Inject 7-17 Units into the skin 3 (three) times daily before meals. Manually add bolus to  continuous dose via OmniPod 3 times daily per sliding scale: CBG 80-150 7 units, 151-200 9 units, 201-250 12 units, 251-300 14 units, 301-400 17 units.  Marland Kitchen levothyroxine (SYNTHROID, LEVOTHROID) 50 MCG tablet Take 1 tablet (50 mcg total) by mouth daily before breakfast.  . metolazone (ZAROXOLYN) 2.5 MG tablet Take 1 tablet (2.5 mg total) by mouth once a week. Wednesday  . Multiple Vitamin (MULTIVITAMIN WITH MINERALS) TABS tablet Take 1 tablet by mouth daily.  . nitroGLYCERIN (NITROSTAT) 0.4 MG SL tablet Place 1 tablet (0.4 mg total) under the tongue every 5 (five) minutes as needed for chest pain.  Letta Pate VERIO test strip   . pantoprazole (PROTONIX) 40 MG tablet TAKE 1 TABLET BY MOUTH EVERY DAY  . pregabalin (LYRICA) 150 MG capsule Take 1 capsule (150 mg total) by mouth 2 (two) times daily.  Marland Kitchen torsemide (DEMADEX) 20 MG tablet Take 80 mg by mouth 2 (two) times daily.  . traZODone (DESYREL) 100 MG tablet TAKE 1 AND 1/2 TABLETS BY MOUTH AT BEDTIME AS NEEDED FOR SLEEP.  Marland Kitchen ursodiol (ACTIGALL) 500 MG tablet Take 500 mg by mouth 3 (three) times daily.  . vitamin E (VITAMIN E) 1000 UNIT capsule Take 1,000 Units by mouth daily with supper.    Allergies  Allergen Reactions  . Codeine Nausea And Vomiting   Past Medical History:  Diagnosis Date  . Acute MI (HCC) 1999; 2007  . Anemia    hx  . Anginal pain (HCC)   . Anxiety   . ARF (acute renal failure) (HCC) 06/2017   [facility]  . Arthritis    "generalized" (03/15/2014)  . CAD (coronary artery disease)    MI in 2000 - MI  2007 - treated bare metal stent (no nuclear since then as 9/11)  . Carotid artery disease (HCC)   . CHF (congestive heart failure) (HCC)   . Chronic diastolic heart failure (HCC)    a) ECHO (08/2013) EF 55-60% and RV function nl b) RHC (08/2013) RA 4, RV 30/5/7, PA 25/10 (16), PCWP 7, Fick CO/CI 6.3/2.7, PVR 1.5 WU, PA 61 and 66%  . Daily headache    "~ every other day; since I fell in June" (03/15/2014)  .  Depression   . Dyslipidemia   . Dyspnea   . Exertional shortness of breath   . HTN (hypertension)   . Hypothyroidism   . Neuropathy   . Obesity   . Osteoarthritis   . Peripheral neuropathy   . PONV (postoperative nausea and vomiting)   . RBBB (right bundle branch block)    Old  . Stroke [facility])    mini strokes  . Syncope    likely due to low blood sugar  . Tachycardia    Sinus tachycardia  . Type II diabetes mellitus (HCC)    Type II  . Urinary incontinence   . Venous insufficiency    Family History  Problem Relation Age of Onset  . Heart attack Mother 7   Past Surgical History:  Procedure Laterality Date  .  ABDOMINAL HYSTERECTOMY  1980's  . AMPUTATION Right 02/24/2018   Procedure: RIGHT FOOT GREAT TOE AND 2ND TOE AMPUTATION;  Surgeon: Nadara Mustard, MD;  Location: [facility] OR;  Service: Orthopedics;  Laterality: Right;  . AMPUTATION Right 04/30/2018   Procedure: RIGHT TRANSMETATARSAL AMPUTATION;  Surgeon: Nadara Mustard, MD;  Location: [facility] OR;  Service: Orthopedics;  Laterality: Right;  . CATARACT EXTRACTION, BILATERAL Bilateral ?2013  . COLONOSCOPY W/ POLYPECTOMY    . COLONOSCOPY WITH PROPOFOL N/A 03/13/2019   Procedure: COLONOSCOPY WITH PROPOFOL;  Surgeon: Beverley Fiedler, MD;  Location: [facility] ENDOSCOPY;  Service: Gastroenterology;  Laterality: N/A;  . CORONARY ANGIOPLASTY WITH STENT PLACEMENT  1999; 2007   "1 + 1"  . ESOPHAGOGASTRODUODENOSCOPY (EGD) WITH PROPOFOL N/A 03/13/2019   Procedure: ESOPHAGOGASTRODUODENOSCOPY (EGD) WITH PROPOFOL;  Surgeon: Beverley Fiedler, MD;  Location: [facility] ENDOSCOPY;  Service: Gastroenterology;  Laterality: N/A;  . EYE SURGERY Bilateral    lazer  . HEMOSTASIS CLIP PLACEMENT  03/13/2019   Procedure: HEMOSTASIS CLIP PLACEMENT;  Surgeon: Beverley Fiedler, MD;  Location: [facility] ENDOSCOPY;  Service: Gastroenterology;;  . KNEE ARTHROSCOPY Left 10/25/2006  . POLYPECTOMY  03/13/2019   Procedure: POLYPECTOMY;  Surgeon: Beverley Fiedler, MD;  Location: [facility] ENDOSCOPY;   Service: Gastroenterology;;  . RIGHT HEART CATH N/A 07/24/2017   Procedure: RIGHT HEART CATH;  Surgeon: Dolores Patty, MD;  Location: [facility] INVASIVE CV LAB;  Service: Cardiovascular;  Laterality: N/A;  . RIGHT HEART CATHETERIZATION N/A 09/22/2013   Procedure: RIGHT HEART CATH;  Surgeon: Dolores Patty, MD;  Location: [facility] CATH LAB;  Service: Cardiovascular;  Laterality: N/A;  . SHOULDER ARTHROSCOPY WITH OPEN ROTATOR CUFF REPAIR Right 03/14/2014   Procedure: RIGHT SHOULDER ARTHROSCOPY WITH BICEPS RELEASE, OPEN SUBSCAPULA REPAIR, OPEN SUPRASPINATUS REPAIR.;  Surgeon: Cammy Copa, MD;  Location: [facility] OR;  Service: Orthopedics;  Laterality: Right;  . TOE AMPUTATION Right 02/24/2018   GREAT TOE AND 2ND TOE AMPUTATION  . TUBAL LIGATION  1970's   Social History   Socioeconomic History  . Marital status: Married    Spouse name: Not on file  . Number of children: 3  . Years of education: 12th  . Highest education level: Not on file  Occupational History    Employer: UNEMPLOYED  Tobacco Use  . Smoking status: Former Smoker    Packs/day: 3.00    Years: 32.00    Pack years: 96.00    Types: Cigarettes    Quit date: 10/24/1997    Years since quitting: 22.2  . Smokeless tobacco: Never Used  Vaping Use  . Vaping Use: Never used  Substance and Sexual Activity  . Alcohol use: Not Currently    Comment: "might have 2-3 daiquiris in the summer"  . Drug use: No  . Sexual activity: Never  Other Topics Concern  . Not on file  Social History Narrative   Pt lives at home with her spouse.   Caffeine Use- 3 sodas daily   Social Determinants of Health   Financial Resource Strain:   . Difficulty of Paying Living Expenses: Not on file  Food Insecurity:   . Worried About Programme researcher, broadcasting/film/video in the Last Year: Not on file  . Ran Out of Food in the Last Year: Not on file  Transportation Needs: No Transportation Needs  . Lack of Transportation (Medical): No  . Lack of Transportation  (Non-Medical): No  Physical Activity: Inactive  . Days of Exercise per Week: 0 days  . Minutes of Exercise  per Session: 10 min  Stress:   . Feeling of Stress : Not on file  Social Connections:   . Frequency of Communication with Friends and Family: Not on file  . Frequency of Social Gatherings with Friends and Family: Not on file  . Attends Religious Services: Not on file  . Active Member of Clubs or Organizations: Not on file  . Attends Banker Meetings: Not on file  . Marital Status: Not on file  Intimate Partner Violence:   . Fear of Current or Ex-Partner: Not on file  . Emotionally Abused: Not on file  . Physically Abused: Not on file  . Sexually Abused: Not on file     Lipid Panel     Component Value Date/Time   CHOL 242 (H) 12/21/2019 1550   TRIG 299 (H) 12/21/2019 1550   HDL 52 12/21/2019 1550   CHOLHDL 4.7 12/21/2019 1550   VLDL 60 (H) 12/21/2019 1550   LDLCALC 130 (H) 12/21/2019 1550   LDLCALC 65 01/17/2019 1608    Blood pressure 106/60, pulse 80, height 5\' 6"  (1.676 m), weight 122.9 kg (271 lb), SpO2 96 %.  Wt Readings from Last 3 Encounters:  01/31/20 122.9 kg (271 lb)  12/21/19 (!) 124 kg (273 lb 6.4 oz)  11/24/19 121.1 kg (267 lb)   PHYSICAL EXAM: General: Walked in the clinic with a cane. No resp difficulty HEENT: normal Neck: supple. no JVD. Carotids 2+ bilat; no bruits. No lymphadenopathy or thryomegaly appreciated. Cor: PMI nondisplaced. Regular rate & rhythm. No rubs, gallops or murmurs. Lungs: clear Abdomen: obese, soft, nontender, nondistended. No hepatosplenomegaly. No bruits or masses. Good bowel sounds. Extremities: no cyanosis, clubbing, rash, R and LLE compression socks. Trace edema Neuro: alert & orientedx3, cranial nerves grossly intact. moves all 4 extremities w/o difficulty. Affect pleasant   ASSESSMENT AND PLAN:   1. Chronic Diastolic Heart Failure: NICM, Echo 06/2017: EF 55-60%, grade 1 DD - Echo 03/19/18: EF 55-60%, grade  1 DD -NYHA III. Volume status stable. Continue torsemide 80 mg twice a day. - Continue compression socks.  - Check BMET     2.  Uncontrolled DM:  - most recent hgb A1c on file was 11  2021 - Follows closely with Dr Lurene Shadow. Has an insulin pump. -  3. CAD: history of CAD with BMS in 2007.  Low risk nuc study in 2018 - No chest pain.  - Continue DAPT w/ ASA + Plavix.  - Continue  blocker therapy w/ Coreg  - off statin due to previously elevated LFTs. Labs reviewed.  -Most recent CMP 5/21 showed normalization of LFTs  4. Obesity: - Body mass index is 43.74 kg/m.  - Discussed portion control.   6. HTN:  Stable.   7. CKD Stage III - Followed by Dr Signe Colt.  - last BMP w/ SCr 1.9  - Check BMET   8. Day time fatigue - suspicion for sleep apnea. Sleep study previously recommended but she refused.  recommendations for sleep study again today and she is still adamant that she dose not want one.   9.  S/p right transmetatarsal amputation - Followed by Dr Lajoyce Corners.   10. Hematochezia  Several days last week.  - Check CBC.    Follow up in 3 months   Aaidyn San NP-C   01/31/2020 3:20 PM

## 2020-02-01 DIAGNOSIS — K8309 Other cholangitis: Secondary | ICD-10-CM | POA: Diagnosis not present

## 2020-02-01 DIAGNOSIS — R194 Change in bowel habit: Secondary | ICD-10-CM | POA: Diagnosis not present

## 2020-02-06 ENCOUNTER — Other Ambulatory Visit: Payer: Self-pay

## 2020-02-06 ENCOUNTER — Emergency Department (HOSPITAL_COMMUNITY): Payer: PPO

## 2020-02-06 ENCOUNTER — Encounter (HOSPITAL_COMMUNITY): Payer: PPO

## 2020-02-06 ENCOUNTER — Encounter (HOSPITAL_COMMUNITY): Payer: Self-pay

## 2020-02-06 ENCOUNTER — Emergency Department (HOSPITAL_COMMUNITY)
Admission: EM | Admit: 2020-02-06 | Discharge: 2020-02-07 | Disposition: A | Discharge status: Home / Self Care | Payer: PPO | Source: Home / Self Care

## 2020-02-06 DIAGNOSIS — R0602 Shortness of breath: Secondary | ICD-10-CM | POA: Insufficient documentation

## 2020-02-06 DIAGNOSIS — E039 Hypothyroidism, unspecified: Secondary | ICD-10-CM | POA: Diagnosis not present

## 2020-02-06 DIAGNOSIS — E1122 Type 2 diabetes mellitus with diabetic chronic kidney disease: Secondary | ICD-10-CM | POA: Diagnosis not present

## 2020-02-06 DIAGNOSIS — E1110 Type 2 diabetes mellitus with ketoacidosis without coma: Secondary | ICD-10-CM | POA: Diagnosis not present

## 2020-02-06 DIAGNOSIS — Z6841 Body Mass Index (BMI) 40.0 and over, adult: Secondary | ICD-10-CM | POA: Diagnosis not present

## 2020-02-06 DIAGNOSIS — R1084 Generalized abdominal pain: Secondary | ICD-10-CM | POA: Diagnosis not present

## 2020-02-06 DIAGNOSIS — E785 Hyperlipidemia, unspecified: Secondary | ICD-10-CM | POA: Diagnosis not present

## 2020-02-06 DIAGNOSIS — E1165 Type 2 diabetes mellitus with hyperglycemia: Secondary | ICD-10-CM | POA: Diagnosis not present

## 2020-02-06 DIAGNOSIS — Z5321 Procedure and treatment not carried out due to patient leaving prior to being seen by health care provider: Secondary | ICD-10-CM | POA: Insufficient documentation

## 2020-02-06 DIAGNOSIS — A040 Enteropathogenic Escherichia coli infection: Secondary | ICD-10-CM | POA: Diagnosis not present

## 2020-02-06 DIAGNOSIS — R079 Chest pain, unspecified: Secondary | ICD-10-CM | POA: Insufficient documentation

## 2020-02-06 DIAGNOSIS — I25119 Atherosclerotic heart disease of native coronary artery with unspecified angina pectoris: Secondary | ICD-10-CM | POA: Diagnosis not present

## 2020-02-06 DIAGNOSIS — I5031 Acute diastolic (congestive) heart failure: Secondary | ICD-10-CM | POA: Diagnosis not present

## 2020-02-06 DIAGNOSIS — I252 Old myocardial infarction: Secondary | ICD-10-CM | POA: Diagnosis not present

## 2020-02-06 DIAGNOSIS — I130 Hypertensive heart and chronic kidney disease with heart failure and stage 1 through stage 4 chronic kidney disease, or unspecified chronic kidney disease: Secondary | ICD-10-CM | POA: Diagnosis not present

## 2020-02-06 DIAGNOSIS — N179 Acute kidney failure, unspecified: Secondary | ICD-10-CM | POA: Diagnosis not present

## 2020-02-06 DIAGNOSIS — R109 Unspecified abdominal pain: Secondary | ICD-10-CM | POA: Insufficient documentation

## 2020-02-06 DIAGNOSIS — Z79890 Hormone replacement therapy (postmenopausal): Secondary | ICD-10-CM | POA: Diagnosis not present

## 2020-02-06 DIAGNOSIS — R52 Pain, unspecified: Secondary | ICD-10-CM | POA: Diagnosis not present

## 2020-02-06 DIAGNOSIS — Z9641 Presence of insulin pump (external) (internal): Secondary | ICD-10-CM | POA: Diagnosis not present

## 2020-02-06 DIAGNOSIS — E876 Hypokalemia: Secondary | ICD-10-CM | POA: Diagnosis not present

## 2020-02-06 DIAGNOSIS — I5032 Chronic diastolic (congestive) heart failure: Secondary | ICD-10-CM | POA: Diagnosis not present

## 2020-02-06 DIAGNOSIS — R112 Nausea with vomiting, unspecified: Secondary | ICD-10-CM | POA: Insufficient documentation

## 2020-02-06 DIAGNOSIS — R1111 Vomiting without nausea: Secondary | ICD-10-CM | POA: Diagnosis not present

## 2020-02-06 DIAGNOSIS — R197 Diarrhea, unspecified: Secondary | ICD-10-CM | POA: Insufficient documentation

## 2020-02-06 DIAGNOSIS — R110 Nausea: Secondary | ICD-10-CM | POA: Diagnosis not present

## 2020-02-06 DIAGNOSIS — E1140 Type 2 diabetes mellitus with diabetic neuropathy, unspecified: Secondary | ICD-10-CM | POA: Diagnosis not present

## 2020-02-06 DIAGNOSIS — I428 Other cardiomyopathies: Secondary | ICD-10-CM | POA: Diagnosis not present

## 2020-02-06 DIAGNOSIS — Z20822 Contact with and (suspected) exposure to covid-19: Secondary | ICD-10-CM | POA: Diagnosis not present

## 2020-02-06 DIAGNOSIS — Z8673 Personal history of transient ischemic attack (TIA), and cerebral infarction without residual deficits: Secondary | ICD-10-CM | POA: Diagnosis not present

## 2020-02-06 DIAGNOSIS — F418 Other specified anxiety disorders: Secondary | ICD-10-CM | POA: Diagnosis not present

## 2020-02-06 DIAGNOSIS — Z87891 Personal history of nicotine dependence: Secondary | ICD-10-CM | POA: Diagnosis not present

## 2020-02-06 DIAGNOSIS — N184 Chronic kidney disease, stage 4 (severe): Secondary | ICD-10-CM | POA: Diagnosis not present

## 2020-02-06 LAB — COMPREHENSIVE METABOLIC PANEL
ALT: 13 U/L (ref 0–44)
AST: 11 U/L — ABNORMAL LOW (ref 15–41)
Albumin: 3.1 g/dL — ABNORMAL LOW (ref 3.5–5.0)
Alkaline Phosphatase: 116 U/L (ref 38–126)
Anion gap: 14 (ref 5–15)
BUN: 30 mg/dL — ABNORMAL HIGH (ref 8–23)
CO2: 24 mmol/L (ref 22–32)
Calcium: 9.2 mg/dL (ref 8.9–10.3)
Chloride: 96 mmol/L — ABNORMAL LOW (ref 98–111)
Creatinine, Ser: 1.98 mg/dL — ABNORMAL HIGH (ref 0.44–1.00)
GFR calc Af Amer: 29 mL/min — ABNORMAL LOW (ref >60)
GFR calc non Af Amer: 25 mL/min — ABNORMAL LOW (ref >60)
Glucose, Bld: 397 mg/dL — ABNORMAL HIGH (ref 70–99)
Potassium: 3.5 mmol/L (ref 3.5–5.1)
Sodium: 134 mmol/L — ABNORMAL LOW (ref 135–145)
Total Bilirubin: 0.7 mg/dL (ref 0.3–1.2)
Total Protein: 6.4 g/dL — ABNORMAL LOW (ref 6.5–8.1)

## 2020-02-06 LAB — CBC
HCT: 40.6 % (ref 36.0–46.0)
Hemoglobin: 12.9 g/dL (ref 12.0–15.0)
MCH: 26.7 pg (ref 26.0–34.0)
MCHC: 31.8 g/dL (ref 30.0–36.0)
MCV: 83.9 fL (ref 80.0–100.0)
Platelets: 285 10*3/uL (ref 150–400)
RBC: 4.84 MIL/uL (ref 3.87–5.11)
RDW: 14.8 % (ref 11.5–15.5)
WBC: 8.7 10*3/uL (ref 4.0–10.5)
nRBC: 0 % (ref 0.0–0.2)

## 2020-02-06 LAB — CBG MONITORING, ED: Glucose-Capillary: 380 mg/dL — ABNORMAL HIGH (ref 70–99)

## 2020-02-06 LAB — LIPASE, BLOOD: Lipase: 23 U/L (ref 11–51)

## 2020-02-06 LAB — TROPONIN I (HIGH SENSITIVITY): Troponin I (High Sensitivity): 24 ng/L — ABNORMAL HIGH (ref <18)

## 2020-02-06 NOTE — ED Notes (Signed)
Pt states "I just can't sit here no more without being seen" and husband came in and took her home.

## 2020-02-06 NOTE — ED Triage Notes (Signed)
Pt arrives to ED w/ c/o 10/10 abdominal pain and chest pain x 1 week. Pt endorses n/v/d and sob.

## 2020-02-06 NOTE — ED Notes (Addendum)
Pt requested CBG to be checked due to feeling like her blood sugar is low. CBG 380.

## 2020-02-07 ENCOUNTER — Inpatient Hospital Stay (HOSPITAL_COMMUNITY)
Admission: EM | Admit: 2020-02-07 | Discharge: 2020-02-10 | DRG: 638 | Disposition: A | Discharge status: Home / Self Care | Payer: PPO | Attending: Internal Medicine | Admitting: Internal Medicine

## 2020-02-07 ENCOUNTER — Other Ambulatory Visit: Payer: Self-pay

## 2020-02-07 ENCOUNTER — Encounter: Payer: Self-pay | Admitting: General Practice

## 2020-02-07 ENCOUNTER — Encounter (HOSPITAL_COMMUNITY): Payer: Self-pay | Admitting: Emergency Medicine

## 2020-02-07 DIAGNOSIS — Z6841 Body Mass Index (BMI) 40.0 and over, adult: Secondary | ICD-10-CM

## 2020-02-07 DIAGNOSIS — Z8673 Personal history of transient ischemic attack (TIA), and cerebral infarction without residual deficits: Secondary | ICD-10-CM | POA: Diagnosis not present

## 2020-02-07 DIAGNOSIS — I252 Old myocardial infarction: Secondary | ICD-10-CM | POA: Diagnosis not present

## 2020-02-07 DIAGNOSIS — N179 Acute kidney failure, unspecified: Secondary | ICD-10-CM | POA: Diagnosis present

## 2020-02-07 DIAGNOSIS — E876 Hypokalemia: Secondary | ICD-10-CM | POA: Diagnosis not present

## 2020-02-07 DIAGNOSIS — A040 Enteropathogenic Escherichia coli infection: Secondary | ICD-10-CM | POA: Diagnosis present

## 2020-02-07 DIAGNOSIS — F418 Other specified anxiety disorders: Secondary | ICD-10-CM | POA: Diagnosis present

## 2020-02-07 DIAGNOSIS — I25119 Atherosclerotic heart disease of native coronary artery with unspecified angina pectoris: Secondary | ICD-10-CM | POA: Diagnosis present

## 2020-02-07 DIAGNOSIS — Z9641 Presence of insulin pump (external) (internal): Secondary | ICD-10-CM | POA: Diagnosis present

## 2020-02-07 DIAGNOSIS — N184 Chronic kidney disease, stage 4 (severe): Secondary | ICD-10-CM | POA: Diagnosis present

## 2020-02-07 DIAGNOSIS — I130 Hypertensive heart and chronic kidney disease with heart failure and stage 1 through stage 4 chronic kidney disease, or unspecified chronic kidney disease: Secondary | ICD-10-CM | POA: Diagnosis present

## 2020-02-07 DIAGNOSIS — E0810 Diabetes mellitus due to underlying condition with ketoacidosis without coma: Secondary | ICD-10-CM

## 2020-02-07 DIAGNOSIS — Z79890 Hormone replacement therapy (postmenopausal): Secondary | ICD-10-CM | POA: Diagnosis not present

## 2020-02-07 DIAGNOSIS — E039 Hypothyroidism, unspecified: Secondary | ICD-10-CM | POA: Diagnosis present

## 2020-02-07 DIAGNOSIS — E1122 Type 2 diabetes mellitus with diabetic chronic kidney disease: Secondary | ICD-10-CM | POA: Diagnosis present

## 2020-02-07 DIAGNOSIS — Z87891 Personal history of nicotine dependence: Secondary | ICD-10-CM

## 2020-02-07 DIAGNOSIS — Z794 Long term (current) use of insulin: Secondary | ICD-10-CM

## 2020-02-07 DIAGNOSIS — E1140 Type 2 diabetes mellitus with diabetic neuropathy, unspecified: Secondary | ICD-10-CM | POA: Diagnosis present

## 2020-02-07 DIAGNOSIS — Z7902 Long term (current) use of antithrombotics/antiplatelets: Secondary | ICD-10-CM

## 2020-02-07 DIAGNOSIS — E785 Hyperlipidemia, unspecified: Secondary | ICD-10-CM | POA: Diagnosis present

## 2020-02-07 DIAGNOSIS — I428 Other cardiomyopathies: Secondary | ICD-10-CM | POA: Diagnosis present

## 2020-02-07 DIAGNOSIS — I5032 Chronic diastolic (congestive) heart failure: Secondary | ICD-10-CM | POA: Diagnosis present

## 2020-02-07 DIAGNOSIS — E1110 Type 2 diabetes mellitus with ketoacidosis without coma: Secondary | ICD-10-CM | POA: Diagnosis present

## 2020-02-07 DIAGNOSIS — Z20822 Contact with and (suspected) exposure to covid-19: Secondary | ICD-10-CM | POA: Diagnosis present

## 2020-02-07 DIAGNOSIS — Z79899 Other long term (current) drug therapy: Secondary | ICD-10-CM

## 2020-02-07 DIAGNOSIS — Z90710 Acquired absence of both cervix and uterus: Secondary | ICD-10-CM

## 2020-02-07 DIAGNOSIS — I5031 Acute diastolic (congestive) heart failure: Secondary | ICD-10-CM | POA: Diagnosis not present

## 2020-02-07 LAB — URINALYSIS, ROUTINE W REFLEX MICROSCOPIC
Bilirubin Urine: NEGATIVE
Glucose, UA: >=500 mg/dL — AB
Ketones, ur: 80 mg/dL — AB
Nitrite: NEGATIVE
Protein, ur: 100 mg/dL — AB
Specific Gravity, Urine: 1.015 (ref 1.005–1.030)
pH: 5 (ref 5.0–8.0)

## 2020-02-07 LAB — CBG MONITORING, ED
Glucose-Capillary: 238 mg/dL — ABNORMAL HIGH (ref 70–99)
Glucose-Capillary: 289 mg/dL — ABNORMAL HIGH (ref 70–99)
Glucose-Capillary: 322 mg/dL — ABNORMAL HIGH (ref 70–99)
Glucose-Capillary: 339 mg/dL — ABNORMAL HIGH (ref 70–99)
Glucose-Capillary: 343 mg/dL — ABNORMAL HIGH (ref 70–99)
Glucose-Capillary: 370 mg/dL — ABNORMAL HIGH (ref 70–99)
Glucose-Capillary: 402 mg/dL — ABNORMAL HIGH (ref 70–99)
Glucose-Capillary: 464 mg/dL — ABNORMAL HIGH (ref 70–99)
Glucose-Capillary: 471 mg/dL — ABNORMAL HIGH (ref 70–99)
Glucose-Capillary: 479 mg/dL — ABNORMAL HIGH (ref 70–99)
Glucose-Capillary: 537 mg/dL (ref 70–99)
Glucose-Capillary: >600 mg/dL (ref 70–99)

## 2020-02-07 LAB — BLOOD GAS, VENOUS
Acid-base deficit: 17.3 mmol/L — ABNORMAL HIGH (ref 0.0–2.0)
Bicarbonate: 10.8 mmol/L — ABNORMAL LOW (ref 20.0–28.0)
FIO2: 21
O2 Saturation: 74.6 %
Patient temperature: 36.4
pCO2, Ven: 32.6 mmHg — ABNORMAL LOW (ref 44.0–60.0)
pH, Ven: 7.121 — CL (ref 7.250–7.430)
pO2, Ven: 48.2 mmHg — ABNORMAL HIGH (ref 32.0–45.0)

## 2020-02-07 LAB — COMPREHENSIVE METABOLIC PANEL
ALT: 15 U/L (ref 0–44)
AST: 12 U/L — ABNORMAL LOW (ref 15–41)
Albumin: 3.3 g/dL — ABNORMAL LOW (ref 3.5–5.0)
Alkaline Phosphatase: 128 U/L — ABNORMAL HIGH (ref 38–126)
BUN: 41 mg/dL — ABNORMAL HIGH (ref 8–23)
CO2: 12 mmol/L — ABNORMAL LOW (ref 22–32)
Calcium: 9.1 mg/dL (ref 8.9–10.3)
Chloride: 93 mmol/L — ABNORMAL LOW (ref 98–111)
Creatinine, Ser: 2.35 mg/dL — ABNORMAL HIGH (ref 0.44–1.00)
GFR calc Af Amer: 24 mL/min — ABNORMAL LOW (ref >60)
GFR calc non Af Amer: 20 mL/min — ABNORMAL LOW (ref >60)
Glucose, Bld: 720 mg/dL (ref 70–99)
Potassium: 5 mmol/L (ref 3.5–5.1)
Sodium: 133 mmol/L — ABNORMAL LOW (ref 135–145)
Total Bilirubin: 2 mg/dL — ABNORMAL HIGH (ref 0.3–1.2)
Total Protein: 6.7 g/dL (ref 6.5–8.1)

## 2020-02-07 LAB — BASIC METABOLIC PANEL
Anion gap: >20 — ABNORMAL HIGH (ref 5–15)
Anion gap: 17 — ABNORMAL HIGH (ref 5–15)
BUN: 38 mg/dL — ABNORMAL HIGH (ref 8–23)
BUN: 38 mg/dL — ABNORMAL HIGH (ref 8–23)
CO2: 12 mmol/L — ABNORMAL LOW (ref 22–32)
CO2: 18 mmol/L — ABNORMAL LOW (ref 22–32)
Calcium: 8.4 mg/dL — ABNORMAL LOW (ref 8.9–10.3)
Calcium: 8.4 mg/dL — ABNORMAL LOW (ref 8.9–10.3)
Chloride: 101 mmol/L (ref 98–111)
Chloride: 99 mmol/L (ref 98–111)
Creatinine, Ser: 2.05 mg/dL — ABNORMAL HIGH (ref 0.44–1.00)
Creatinine, Ser: 1.96 mg/dL — ABNORMAL HIGH (ref 0.44–1.00)
GFR calc Af Amer: 28 mL/min — ABNORMAL LOW (ref >60)
GFR calc Af Amer: 29 mL/min — ABNORMAL LOW (ref >60)
GFR calc non Af Amer: 24 mL/min — ABNORMAL LOW (ref >60)
GFR calc non Af Amer: 25 mL/min — ABNORMAL LOW (ref >60)
Glucose, Bld: 497 mg/dL — ABNORMAL HIGH (ref 70–99)
Glucose, Bld: 356 mg/dL — ABNORMAL HIGH (ref 70–99)
Potassium: 4 mmol/L (ref 3.5–5.1)
Potassium: 3.6 mmol/L (ref 3.5–5.1)
Sodium: 135 mmol/L (ref 135–145)
Sodium: 134 mmol/L — ABNORMAL LOW (ref 135–145)

## 2020-02-07 LAB — CBC WITH DIFFERENTIAL/PLATELET
Abs Immature Granulocytes: 0.05 10*3/uL (ref 0.00–0.07)
Basophils Absolute: 0 10*3/uL (ref 0.0–0.1)
Basophils Relative: 0 %
Eosinophils Absolute: 0 10*3/uL (ref 0.0–0.5)
Eosinophils Relative: 0 %
HCT: 43.9 % (ref 36.0–46.0)
Hemoglobin: 12.9 g/dL (ref 12.0–15.0)
Immature Granulocytes: 1 %
Lymphocytes Relative: 3 %
Lymphs Abs: 0.2 10*3/uL — ABNORMAL LOW (ref 0.7–4.0)
MCH: 26.8 pg (ref 26.0–34.0)
MCHC: 29.4 g/dL — ABNORMAL LOW (ref 30.0–36.0)
MCV: 91.1 fL (ref 80.0–100.0)
Monocytes Absolute: 0.2 10*3/uL (ref 0.1–1.0)
Monocytes Relative: 2 %
Neutro Abs: 8.4 10*3/uL — ABNORMAL HIGH (ref 1.7–7.7)
Neutrophils Relative %: 94 %
Platelets: 257 10*3/uL (ref 150–400)
RBC: 4.82 MIL/uL (ref 3.87–5.11)
RDW: 15.3 % (ref 11.5–15.5)
WBC: 8.9 10*3/uL (ref 4.0–10.5)
nRBC: 0 % (ref 0.0–0.2)

## 2020-02-07 LAB — SARS CORONAVIRUS 2 BY RT PCR (HOSPITAL ORDER, PERFORMED IN ~~LOC~~ HOSPITAL LAB): SARS Coronavirus 2: NEGATIVE

## 2020-02-07 LAB — BETA-HYDROXYBUTYRIC ACID
Beta-Hydroxybutyric Acid: >8 mmol/L — ABNORMAL HIGH (ref 0.05–0.27)
Beta-Hydroxybutyric Acid: 4.47 mmol/L — ABNORMAL HIGH (ref 0.05–0.27)

## 2020-02-07 MED ORDER — ASPIRIN EC 81 MG PO TBEC
81.0000 mg | DELAYED_RELEASE_TABLET | Freq: Every morning | ORAL | Status: DC
  Administered 2020-02-08 – 2020-02-10 (×3): 81 mg via ORAL
  Filled 2020-02-07 (×3): qty 1

## 2020-02-07 MED ORDER — POTASSIUM CHLORIDE 10 MEQ/100ML IV SOLN
10.0000 meq | INTRAVENOUS | Status: AC
  Administered 2020-02-07 (×2): 10 meq via INTRAVENOUS
  Filled 2020-02-07 (×2): qty 100

## 2020-02-07 MED ORDER — LOPERAMIDE HCL 2 MG PO CAPS
2.0000 mg | ORAL_CAPSULE | Freq: Three times a day (TID) | ORAL | Status: DC | PRN
  Administered 2020-02-07: 2 mg via ORAL
  Filled 2020-02-07: qty 1

## 2020-02-07 MED ORDER — VITAMIN D 25 MCG (1000 UNIT) PO TABS
1000.0000 [IU] | ORAL_TABLET | Freq: Every day | ORAL | Status: DC
  Administered 2020-02-07 – 2020-02-10 (×4): 1000 [IU] via ORAL
  Filled 2020-02-07 (×4): qty 1

## 2020-02-07 MED ORDER — CARVEDILOL 12.5 MG PO TABS
12.5000 mg | ORAL_TABLET | Freq: Two times a day (BID) | ORAL | Status: DC
  Administered 2020-02-07 – 2020-02-10 (×7): 12.5 mg via ORAL
  Filled 2020-02-07 (×7): qty 1

## 2020-02-07 MED ORDER — LACTATED RINGERS IV SOLN: INTRAVENOUS | Status: DC

## 2020-02-07 MED ORDER — FERROUS SULFATE 325 (65 FE) MG PO TABS
325.0000 mg | ORAL_TABLET | Freq: Two times a day (BID) | ORAL | Status: DC
  Administered 2020-02-07 – 2020-02-10 (×7): 325 mg via ORAL
  Filled 2020-02-07 (×7): qty 1

## 2020-02-07 MED ORDER — CLOPIDOGREL BISULFATE 75 MG PO TABS
75.0000 mg | ORAL_TABLET | Freq: Every day | ORAL | Status: DC
  Administered 2020-02-07 – 2020-02-10 (×4): 75 mg via ORAL
  Filled 2020-02-07 (×4): qty 1

## 2020-02-07 MED ORDER — DEXTROSE IN LACTATED RINGERS 5 % IV SOLN
INTRAVENOUS | Status: DC
  Administered 2020-02-07: 125 mL/h via INTRAVENOUS

## 2020-02-07 MED ORDER — ALLOPURINOL 100 MG PO TABS
100.0000 mg | ORAL_TABLET | Freq: Two times a day (BID) | ORAL | Status: DC
  Administered 2020-02-07 – 2020-02-10 (×6): 100 mg via ORAL
  Filled 2020-02-07 (×11): qty 1

## 2020-02-07 MED ORDER — INSULIN REGULAR(HUMAN) IN NACL 100-0.9 UT/100ML-% IV SOLN
INTRAVENOUS | Status: DC
  Administered 2020-02-07: 8 [IU]/h via INTRAVENOUS
  Filled 2020-02-07 (×3): qty 100

## 2020-02-07 MED ORDER — INSULIN ASPART 100 UNIT/ML IV SOLN
10.0000 [IU] | Freq: Once | INTRAVENOUS | Status: AC
  Administered 2020-02-07: 10 [IU] via INTRAVENOUS

## 2020-02-07 MED ORDER — NITROGLYCERIN 0.4 MG SL SUBL: 0.4000 mg | SUBLINGUAL_TABLET | SUBLINGUAL | Status: DC | PRN

## 2020-02-07 MED ORDER — HYDRALAZINE HCL 20 MG/ML IJ SOLN
10.0000 mg | Freq: Four times a day (QID) | INTRAMUSCULAR | Status: DC | PRN
  Filled 2020-02-07: qty 1

## 2020-02-07 MED ORDER — SODIUM CHLORIDE 0.9 % IV BOLUS
2000.0000 mL | Freq: Once | INTRAVENOUS | Status: AC
  Administered 2020-02-07: 2000 mL via INTRAVENOUS

## 2020-02-07 MED ORDER — ENOXAPARIN SODIUM 40 MG/0.4ML ~~LOC~~ SOLN
40.0000 mg | Freq: Two times a day (BID) | SUBCUTANEOUS | Status: DC
  Administered 2020-02-07 – 2020-02-09 (×4): 40 mg via SUBCUTANEOUS
  Filled 2020-02-07 (×5): qty 0.4

## 2020-02-07 MED ORDER — PREGABALIN 75 MG PO CAPS
150.0000 mg | ORAL_CAPSULE | Freq: Two times a day (BID) | ORAL | Status: DC
  Administered 2020-02-07 – 2020-02-10 (×6): 150 mg via ORAL
  Filled 2020-02-07 (×6): qty 2

## 2020-02-07 MED ORDER — ASCORBIC ACID 500 MG PO TABS
1000.0000 mg | ORAL_TABLET | Freq: Every day | ORAL | Status: DC
  Administered 2020-02-07 – 2020-02-10 (×4): 1000 mg via ORAL
  Filled 2020-02-07 (×4): qty 2

## 2020-02-07 MED ORDER — DEXTROSE 50 % IV SOLN: 0.0000 mL | INTRAVENOUS | Status: DC | PRN

## 2020-02-07 MED ORDER — LEVOTHYROXINE SODIUM 50 MCG PO TABS
50.0000 ug | ORAL_TABLET | Freq: Every day | ORAL | Status: DC
  Administered 2020-02-08 – 2020-02-10 (×3): 50 ug via ORAL
  Filled 2020-02-07 (×3): qty 1

## 2020-02-07 MED ORDER — LACTATED RINGERS IV BOLUS
20.0000 mL/kg | Freq: Once | INTRAVENOUS | Status: AC
  Administered 2020-02-07: 2450 mL via INTRAVENOUS

## 2020-02-07 MED ORDER — SODIUM CHLORIDE 0.9% FLUSH
3.0000 mL | Freq: Two times a day (BID) | INTRAVENOUS | Status: DC
  Administered 2020-02-08 – 2020-02-10 (×4): 3 mL via INTRAVENOUS

## 2020-02-07 MED ORDER — PANTOPRAZOLE SODIUM 40 MG PO TBEC
40.0000 mg | DELAYED_RELEASE_TABLET | Freq: Every day | ORAL | Status: DC
  Administered 2020-02-07 – 2020-02-10 (×4): 40 mg via ORAL
  Filled 2020-02-07 (×4): qty 1

## 2020-02-07 NOTE — ED Notes (Signed)
Date and time results received: 02/07/20 1340 (use smartphrase ".now" to insert current time)  Test: ABG-PH Critical Value: 7.121  Name of Provider Notified: Zammit  Orders Received? Or Actions Taken?:

## 2020-02-07 NOTE — ED Provider Notes (Signed)
[facility] Provider Note   CSN: [id number] Arrival date & time: 02/07/20  1057     History Chief Complaint  Patient presents with  . Hyperglycemia    Susan [name] is a 69 y.o. female.  Patient complains of weakness and diarrhea for a few days.  Patient has history of diabetes  The history is provided by the patient and medical records. No language interpreter was used.  Hyperglycemia Blood sugar level PTA:  Over 600 Severity:  Moderate Onset quality:  Sudden Timing:  Constant Progression:  Worsening Chronicity:  Recurrent Diabetes status:  Controlled with insulin Context: not change in medication   Relieved by:  Nothing Ineffective treatments:  None tried Associated symptoms: no abdominal pain, no chest pain and no fatigue        Past Medical History:  Diagnosis Date  . Acute MI ([place]) 1999; 2007  . Anemia    hx  . Anginal pain ([place])   . Anxiety   . ARF (acute renal failure) ([place]) 06/2017   [facility]  . Arthritis    "generalized" (03/15/2014)  . CAD (coronary artery disease)    MI in 2000 - MI  2007 - treated bare metal stent (no nuclear since then as 9/11)  . Carotid artery disease ([place])   . CHF (congestive heart failure) ([place])   . Chronic diastolic heart failure (HCC)    a) ECHO (08/2013) EF 55-60% and RV function nl b) RHC (08/2013) RA 4, RV 30/5/7, PA 25/10 (16), PCWP 7, Fick CO/CI 6.3/2.7, PVR 1.5 WU, PA 61 and 66%  . Daily headache    "~ every other day; since I fell in June" (03/15/2014)  . Depression   . Dyslipidemia   . Dyspnea   . Exertional shortness of breath   . HTN (hypertension)   . Hypothyroidism   . Neuropathy   . Obesity   . Osteoarthritis   . Peripheral neuropathy   . PONV (postoperative nausea and vomiting)   . RBBB (right bundle branch block)    Old  . Stroke [facility])    mini strokes  . Syncope    likely due to low blood sugar  . Tachycardia    Sinus tachycardia  . Type II diabetes mellitus (HCC)     Type II  . Urinary incontinence   . Venous insufficiency     Patient Active Problem List   Diagnosis Date Noted  . Common bile duct (CBD) obstruction 05/28/2019  . Benign neoplasm of ascending colon   . Benign neoplasm of transverse colon   . Benign neoplasm of descending colon   . Benign neoplasm of sigmoid colon   . Gastric polyps   . Hyperkalemia 03/11/2019  . Prolonged QT interval 03/11/2019  . Onychomycosis 06/21/2018  . Pressure injury of skin 03/19/2018  . Syncope 03/18/2018  . Osteomyelitis of second toe of right foot ([place])   . Toe osteomyelitis, right ([place])   . 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 ([place])   . PVD (peripheral vascular disease) ([place]) 10/26/2017  . Hypothyroid 07/27/2017  . PAH (pulmonary artery hypertension) ([place])   . Impaired ambulation 07/19/2017  . Leg cramps 02/27/2017  . Peripheral edema 01/12/2017  . Diabetic neuropathy ([place]) 11/12/2016  . 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 mellitus ([place]) 10/24/2015  . Anemia 10/03/2015  . Generalized anxiety disorder 10/03/2015  . Insomnia 10/03/2015  . Chest pain   . Gastritis 06/07/2015  . Diabetes mellitus  type II, uncontrolled ([place]) 06/07/2015  . Chronic diastolic CHF (congestive heart failure) ([place]) 06/07/2015  . Non compliance with medical treatment 04/17/2014  . Rotator cuff tear 03/14/2014  . Morbid obesity ([place]) 09/23/2013  . Acute kidney injury superimposed on chronic kidney disease ([place]) 12/25/2012  . Hypotension 12/25/2012  . Urinary incontinence   . MDD (major depressive disorder) 11/12/2010  . RBBB (right bundle branch block)   . CAD (coronary artery disease)   . Hyperlipemia 01/22/2009  . Essential hypertension 01/22/2009    Past Surgical History:  Procedure Laterality Date  . ABDOMINAL HYSTERECTOMY  1980's  . AMPUTATION Right 02/24/2018   Procedure: RIGHT FOOT GREAT TOE AND 2ND TOE AMPUTATION;  Surgeon: Newt Minion, MD;  Location: [facility];  Service: Orthopedics;   Laterality: Right;  . AMPUTATION Right 04/30/2018   Procedure: RIGHT TRANSMETATARSAL AMPUTATION;  Surgeon: Newt Minion, MD;  Location: [place];  Service: Orthopedics;  Laterality: Right;  . CATARACT EXTRACTION, BILATERAL Bilateral ?2013  . COLONOSCOPY W/ POLYPECTOMY    . COLONOSCOPY WITH PROPOFOL N/A 03/13/2019   Procedure: COLONOSCOPY WITH PROPOFOL;  Surgeon: Jerene Bears, MD;  Location: [facility];  Service: Gastroenterology;  Laterality: N/A;  . [place]; 2007   "1 + 1"  . ESOPHAGOGASTRODUODENOSCOPY (EGD) WITH PROPOFOL N/A 03/13/2019   Procedure: ESOPHAGOGASTRODUODENOSCOPY (EGD) WITH PROPOFOL;  Surgeon: Jerene Bears, MD;  Location: [facility] ENDOSCOPY;  Service: Gastroenterology;  Laterality: N/A;  . EYE SURGERY Bilateral    lazer  . HEMOSTASIS CLIP PLACEMENT  03/13/2019   Procedure: HEMOSTASIS CLIP PLACEMENT;  Surgeon: Jerene Bears, MD;  Location: [facility] ENDOSCOPY;  Service: Gastroenterology;;  . KNEE ARTHROSCOPY Left 10/25/2006  . POLYPECTOMY  03/13/2019   Procedure: POLYPECTOMY;  Surgeon: Jerene Bears, MD;  Location: [facility];  Service: Gastroenterology;;  . RIGHT HEART CATH N/A 07/24/2017   Procedure: RIGHT HEART CATH;  Surgeon: Jolaine Artist, MD;  Location: [facility] CV LAB;  Service: Cardiovascular;  Laterality: N/A;  . RIGHT HEART CATHETERIZATION N/A 09/22/2013   Procedure: RIGHT HEART CATH;  Surgeon: Jolaine Artist, MD;  Location: [facility] CATH LAB;  Service: Cardiovascular;  Laterality: N/A;  . SHOULDER ARTHROSCOPY WITH OPEN ROTATOR CUFF REPAIR Right 03/14/2014   Procedure: RIGHT SHOULDER ARTHROSCOPY WITH BICEPS RELEASE, OPEN SUBSCAPULA REPAIR, OPEN SUPRASPINATUS REPAIR.;  Surgeon: Meredith Pel, MD;  Location: [place];  Service: Orthopedics;  Laterality: Right;  . TOE AMPUTATION Right 02/24/2018   GREAT TOE AND 2ND TOE AMPUTATION  . TUBAL LIGATION  1970's     OB History   No obstetric history on file.     Family History    Problem Relation Age of Onset  . Heart attack Mother 3    Social History   Tobacco Use  . Smoking status: Former Smoker    Packs/day: 3.00    Years: 32.00    Pack years: 96.00    Types: Cigarettes    Quit date: 10/24/1997    Years since quitting: 22.3  . Smokeless tobacco: Never Used  Vaping Use  . Vaping Use: Never used  Substance Use Topics  . Alcohol use: Not Currently    Comment: "might have 2-3 daiquiris in the summer"  . Drug use: No    Home Medications Prior to Admission medications   Medication Sig Start Date End Date Taking? Authorizing Provider  albuterol (VENTOLIN HFA) 108 (90 Base) MCG/ACT inhaler TAKE 2 PUFFS BY MOUTH EVERY 6 HOURS AS NEEDED  FOR WHEEZE OR SHORTNESS OF BREATH Patient taking differently: Inhale 2 puffs into the lungs every 6 (six) hours as needed for wheezing or shortness of breath.  06/27/19   [place], [name], MD  allopurinol (ZYLOPRIM) 100 MG tablet TAKE 1 TABLET BY MOUTH TWICE A DAY Patient taking differently: Take 100 mg by mouth 2 (two) times daily.  08/02/19   Newt Minion, MD  Ascorbic Acid (VITAMIN C) 1000 MG tablet Take 1,000 mg by mouth daily.    [provider]  aspirin EC 81 MG tablet Take 81 mg by mouth every morning.     [provider]  carvedilol (COREG) 12.5 MG tablet TAKE 1 TABLET BY MOUTH TWICE A DAY WITH MEALS 10/31/19   [place], [name], MD  cholecalciferol (VITAMIN D) 1000 units tablet Take 1,000 Units by mouth daily with supper.     [provider]  clopidogrel (PLAVIX) 75 MG tablet TAKE 1 TABLET BY MOUTH EVERY DAY WITH BREAKFAST Patient taking differently: Take 75 mg by mouth daily.  05/11/19   Clegg, Amy D, NP  colchicine 0.6 MG tablet Take 1 tablet (0.6 mg total) by mouth daily. As needed for gout flare Patient not taking: Reported on 01/31/2020 10/14/18   Newt Minion, MD  escitalopram (LEXAPRO) 20 MG tablet TAKE 1 TABLET BY MOUTH EVERY DAY Patient taking differently: Take 20 mg by mouth daily.   05/11/19   [name], MD  ferrous sulfate 325 (65 FE) MG tablet Take 1 tablet (325 mg total) by mouth 2 (two) times daily with a meal. Reported on 08/21/2015 06/07/18   Alycia Rossetti, MD  HYDROcodone-acetaminophen (NORCO) 5-325 MG tablet Take 1 tablet by mouth every 8 (eight) hours as needed for moderate pain. Patient not taking: Reported on 01/31/2020 11/01/19   Alycia Rossetti, MD  Insulin Disposable Pump (OMNIPOD DASH 5 PACK PODS) MISC Inject into the skin. Use continuously with Novolin R - change every 72 hours. 11/18/18   [provider]  insulin NPH Human (HUMULIN N,NOVOLIN N) 100 UNIT/ML injection Inject 20 Units into the skin See admin instructions. Inject 20 units subcutaneously in the morning if CBG >200    [provider]  insulin regular (NOVOLIN R) 100 units/mL injection Inject 7-17 Units into the skin 3 (three) times daily before meals. Manually add bolus to continuous dose via OmniPod 3 times daily per sliding scale: CBG 80-150 7 units, 151-200 9 units, 201-250 12 units, 251-300 14 units, 301-400 17 units.    [provider]  levothyroxine (SYNTHROID, LEVOTHROID) 50 MCG tablet Take 1 tablet (50 mcg total) by mouth daily before breakfast. 11/07/16   Dena Billet B, PA-C  magnesium oxide (MAG-OX) 400 MG tablet TAKE 1 TABLET BY MOUTH EVERY DAY Patient not taking: Reported on 01/31/2020 09/13/19   Alycia Rossetti, MD  metolazone (ZAROXOLYN) 2.5 MG tablet Take 1 tablet (2.5 mg total) by mouth once a week. Wednesday 12/21/19   Consuelo Pandy, PA-C  Multiple Vitamin (MULTIVITAMIN WITH MINERALS) TABS tablet Take 1 tablet by mouth daily.    [provider]  nitroGLYCERIN (NITROSTAT) 0.4 MG SL tablet Place 1 tablet (0.4 mg total) under the tongue every 5 (five) minutes as needed for chest pain. 04/28/19   Conrad Lenox, NP  ONETOUCH VERIO test strip  11/02/17   [provider]  pantoprazole (PROTONIX) 40 MG tablet TAKE 1 TABLET BY MOUTH EVERY DAY  01/04/20   Alycia Rossetti, MD  potassium  chloride SA (KLOR-CON) 20 MEQ tablet Take 2 tablets (40 mEq total) by mouth once a week. On Wednesday with Metolazone Patient not taking: Reported on 01/31/2020 12/21/19 03/20/20  Lyda Jester M, PA-C  pregabalin (LYRICA) 150 MG capsule Take 1 capsule (150 mg total) by mouth 2 (two) times daily. 11/12/16   Alycia Rossetti, MD  torsemide (DEMADEX) 20 MG tablet Take 80 mg by mouth 2 (two) times daily.    [provider]  traZODone (DESYREL) 100 MG tablet TAKE 1 AND 1/2 TABLETS BY MOUTH AT BEDTIME AS NEEDED FOR SLEEP. 01/26/20   Alycia Rossetti, MD  ursodiol (ACTIGALL) 500 MG tablet Take 500 mg by mouth 3 (three) times daily. 09/09/19   [provider]  vitamin E (VITAMIN E) 1000 UNIT capsule Take 1,000 Units by mouth daily with supper.     [provider]    Allergies    Codeine  Review of Systems   Review of Systems  Constitutional: Negative for appetite change and fatigue.  HENT: Negative for congestion, ear discharge and sinus pressure.   Eyes: Negative for discharge.  Respiratory: Negative for cough.   Cardiovascular: Negative for chest pain.  Gastrointestinal: Negative for abdominal pain and diarrhea.  Genitourinary: Negative for frequency and hematuria.       Diarrhea  Musculoskeletal: Negative for back pain.  Skin: Negative for rash.  Neurological: Negative for seizures and headaches.  Psychiatric/Behavioral: Negative for hallucinations.    Physical Exam Updated Vital Signs BP (!) 158/68 (BP Location: Left Arm)   Pulse 100   Temp (!) 97.5 F (36.4 C) (Oral)   Resp 20   Ht 5\' 5"  (1.651 m)   Wt 122.5 kg   SpO2 98%   BMI 44.93 kg/m   Physical Exam Vitals and nursing note reviewed.  Constitutional:      Appearance: She is well-developed.  HENT:     Head: Normocephalic.     Nose: Nose normal.     Mouth/Throat:     Comments: Dry mucous membrane Eyes:     General: No scleral icterus.     Conjunctiva/sclera: Conjunctivae normal.  Neck:     Thyroid: No thyromegaly.  Cardiovascular:     Rate and Rhythm: Normal rate and regular rhythm.     Heart sounds: No murmur heard.  No friction rub. No gallop.   Pulmonary:     Breath sounds: No stridor. No wheezing or rales.  Chest:     Chest wall: No tenderness.  Abdominal:     General: There is no distension.     Tenderness: There is no abdominal tenderness. There is no rebound.  Musculoskeletal:        General: Normal range of motion.     Cervical back: Neck supple.  Lymphadenopathy:     Cervical: No cervical adenopathy.  Skin:    Findings: No erythema or rash.  Neurological:     Mental Status: She is alert and oriented to person, place, and time.     Motor: No abnormal muscle tone.     Coordination: Coordination normal.  Psychiatric:        Behavior: Behavior normal.     ED Results / Procedures / Treatments   Labs (all labs ordered are listed, but only abnormal results are displayed) Labs Reviewed  CBC WITH DIFFERENTIAL/PLATELET - Abnormal; Notable for the following components:      Result Value   MCHC 29.4 (*)    Neutro Abs 8.4 (*)  Lymphs Abs 0.2 (*)    All other components within normal limits  COMPREHENSIVE METABOLIC PANEL - Abnormal; Notable for the following components:   Sodium 133 (*)    Chloride 93 (*)    CO2 12 (*)    Glucose, Bld 720 (*)    BUN 41 (*)    Creatinine, Ser 2.35 (*)    Albumin 3.3 (*)    AST 12 (*)    Alkaline Phosphatase 128 (*)    Total Bilirubin 2.0 (*)    GFR calc non Af Amer 20 (*)    GFR calc Af Amer 24 (*)    All other components within normal limits  CBG MONITORING, ED - Abnormal; Notable for the following components:   Glucose-Capillary >600 (*)    All other components within normal limits  SARS CORONAVIRUS 2 BY RT PCR (HOSPITAL ORDER, [facility])  BETA-HYDROXYBUTYRIC ACID  BETA-HYDROXYBUTYRIC ACID  URINALYSIS, ROUTINE W REFLEX MICROSCOPIC    BLOOD GAS, VENOUS  CBG MONITORING, ED    EKG None  Radiology DG Chest 2 View  Result Date: 02/06/2020 CLINICAL DATA:  Chest pain EXAM: CHEST - 2 VIEW COMPARISON:  Chest x-ray 05/28/2019. FINDINGS: The heart size and mediastinal contours are unchanged. Aortic arch calcifications. No focal consolidation. No pulmonary edema. No pleural effusion. No pneumothorax. No acute osseous abnormality. Upper abdomen surgical clip identified on lateral view. IMPRESSION: No active cardiopulmonary disease. Electronically Signed   By: Iven Finn M.D.   On: 02/06/2020 16:27    Procedures Procedures (including critical care time)  Medications Ordered in ED Medications  lactated ringers bolus 2,450 mL (has no administration in time range)  insulin regular, human (MYXREDLIN) 100 units/ 100 mL infusion (has no administration in time range)  lactated ringers infusion (has no administration in time range)  dextrose 5 % in lactated ringers infusion (has no administration in time range)  dextrose 50 % solution 0-50 mL (has no administration in time range)  potassium chloride 10 mEq in 100 mL IVPB (has no administration in time range)  sodium chloride 0.9 % bolus 2,000 mL (2,000 mLs Intravenous Bolus from Bag 02/07/20 1156)  insulin aspart (novoLOG) injection 10 Units (10 Units Intravenous Given 02/07/20 1154)    ED Course  I have reviewed the triage vital signs and the nursing notes.  Pertinent labs & imaging results that were available during my care of the patient were reviewed by me and considered in my medical decision making (see chart for details).    CRITICAL CARE Performed by: Milton Ferguson Total critical care time: 45 minutes Critical care time was exclusive of separately billable procedures and treating other patients. Critical care was necessary to treat or prevent imminent or life-threatening deterioration. Critical care was time spent personally by me on the following activities:  development of treatment plan with patient and/or surrogate as well as nursing, discussions with consultants, evaluation of patient's response to treatment, examination of patient, obtaining history from patient or surrogate, ordering and performing treatments and interventions, ordering and review of laboratory studies, ordering and review of radiographic studies, pulse oximetry and re-evaluation of patient's condition.  MDM Rules/Calculators/A&P                          Patient in DKA.  She is started on insulin drip will be admitted to medicine        This patient presents to the ED for concern of weakness,  this involves an extensive number of treatment options, and is a complaint that carries with it a high risk of complications and morbidity.  The differential diagnosis includes poorly controlled diabetes   Lab Tests:   I Ordered, reviewed, and interpreted labs, which included sugar greater than 600  Medicines ordered:   I ordered medication insulin and fluids  Imaging Studies ordered:    Additional history obtained:   Additional history obtained from records  Previous records obtained and reviewed.  Consultations Obtained:   I consulted hospitalist and discussed lab and imaging findings  Reevaluation:  After the interventions stated above, I reevaluated the patient and found minimal improvement  Critical Interventions:  .   Final Clinical Impression(s) / ED Diagnoses Final diagnoses:  Diabetic ketoacidosis without coma associated with diabetes mellitus due to underlying condition [facility])    Rx / [name] Orders ED Discharge Orders    None       Milton Ferguson, MD 02/07/20 1331

## 2020-02-07 NOTE — ED Notes (Signed)
Date and time results received: 02/07/20 1206  Test: Glucose Critical Value: 720  Name of Provider Notified: Dr. Roderic Palau

## 2020-02-07 NOTE — Progress Notes (Signed)
Inpatient Diabetes Program Recommendations  AACE/ADA: New Consensus Statement on Inpatient Glycemic Control (2015)  Target Ranges:  Prepandial:   less than 140 mg/dL      Peak postprandial:   less than 180 mg/dL (1-2 hours)      Critically ill patients:  140 - 180 mg/dL   Lab Results  Component Value Date   GLUCAP >600 (HH) 02/07/2020   HGBA1C 11.8 (H) 12/21/2019    Review of Glycemic Control Results for Susan [name], Susan [name] (MRN [id number]) as of 02/07/2020 12:25  Ref. Range 02/06/2020 18:18 02/07/2020 11:09  Glucose-Capillary Latest Ref Range: 70 - 99 mg/dL 380 (H) >600 (HH)   Diabetes history: DM 2 Outpatient Diabetes medications: NPH 20 units bid (if blood sugar>200 mg/dL).  Uses Omnipod for boluses with meals (7-17 units tid with meals) Current orders for Inpatient glycemic control: None yet  Inpatient Diabetes Program Recommendations:    Recommend IV insulin/ DKA order set.  Will follow.   Thanks  Adah Perl, RN, BC-ADM Inpatient Diabetes Coordinator Pager [phone] (8a-5p)

## 2020-02-07 NOTE — ED Notes (Signed)
HH meal tray given to pt w/diet ginger ale

## 2020-02-07 NOTE — ED Triage Notes (Signed)
Pt arrives to ED via [facility] c/o N/V/D x5days . Recently diagnosed w/ ecoli. Pt has hyperglycemia. CBG reading high for EMS. Pt gave self 10U fast acting insulin prior to arrival. Pt went to [facility] yesterday but after a 5 hour wait pt left without being seen .

## 2020-02-07 NOTE — H&P (Signed)
[facility] History and Physical  Caryn Gienger GHW:[id number] DOB: [birth year] DOA: 02/07/2020 PCP: Alycia Rossetti, MD  Admitted from: Home Chief Complaint: Vomiting, diarrhea, elevated blood sugar  History of Present Illness: Susan [name] is a 69 y.o. female with PMH significant for DM 2, HTN, HLD, CAD, MI status post stents, chronic diastolic CHF TIA, CKD 4, morbid obesity, right transmetatarsal amputation, hypothyroidism.   Patient states she has nausea, vomiting and diarrhea for last 10 days.  She saw GI Dr. Collene Mares a week ago.  Patient reports stool studies showed enteropathogenic E. coli.  Antibiotics were not thought to be helpful and were not started. Patient also reports that because of poor oral intake she stopped taking her insulin a week ago.  She got progressive generalized weakness.  She presented to the ED yesterday on 9/13 but had to wait several hours and hence left without being seen.  Her blood sugar this morning at home was significantly elevated and hence EMS was called. Patient gave herself 10 units of fast acting insulin. Patient was recently diagnosed with E. coli UTI.  She apparently came to the ED yesterday but left after 5 hours of waiting.   In the ED, patient's blood sugar level was more than 600. Labs with sodium 133, potassium 5, BUN/creatinine 41/2.35.  CBC normal. Blood gas with pH of 7.21, PCO2 33, HCO3 10.8. Patient was started on IV insulin drip, IV fluid. Hospital service was consulted for management of DKA.   Review of Systems:  All systems were reviewed and were negative unless otherwise mentioned in the HPI   Past medical history: Past Medical History:  Diagnosis Date  . Acute MI ([place]) 1999; 2007  . Anemia    hx  . Anginal pain ([place])   . Anxiety   . ARF (acute renal failure) ([place]) 06/2017   [facility]  . Arthritis    "generalized" (03/15/2014)  . CAD (coronary artery disease)    MI in 2000 - MI  2007 - treated bare metal  stent (no nuclear since then as 9/11)  . Carotid artery disease ([place])   . CHF (congestive heart failure) ([place])   . Chronic diastolic heart failure (HCC)    a) ECHO (08/2013) EF 55-60% and RV function nl b) RHC (08/2013) RA 4, RV 30/5/7, PA 25/10 (16), PCWP 7, Fick CO/CI 6.3/2.7, PVR 1.5 WU, PA 61 and 66%  . Daily headache    "~ every other day; since I fell in June" (03/15/2014)  . Depression   . Dyslipidemia   . Dyspnea   . Exertional shortness of breath   . HTN (hypertension)   . Hypothyroidism   . Neuropathy   . Obesity   . Osteoarthritis   . Peripheral neuropathy   . PONV (postoperative nausea and vomiting)   . RBBB (right bundle branch block)    Old  . Stroke [facility])    mini strokes  . Syncope    likely due to low blood sugar  . Tachycardia    Sinus tachycardia  . Type II diabetes mellitus (HCC)    Type II  . Urinary incontinence   . Venous insufficiency     Past surgical history: Past Surgical History:  Procedure Laterality Date  . ABDOMINAL HYSTERECTOMY  1980's  . AMPUTATION Right 02/24/2018   Procedure: RIGHT FOOT GREAT TOE AND 2ND TOE AMPUTATION;  Surgeon: Newt Minion, MD;  Location: [place];  Service: Orthopedics;  Laterality: Right;  . AMPUTATION Right 04/30/2018  Procedure: RIGHT TRANSMETATARSAL AMPUTATION;  Surgeon: Newt Minion, MD;  Location: [facility];  Service: Orthopedics;  Laterality: Right;  . CATARACT EXTRACTION, BILATERAL Bilateral ?2013  . COLONOSCOPY W/ POLYPECTOMY    . COLONOSCOPY WITH PROPOFOL N/A 03/13/2019   Procedure: COLONOSCOPY WITH PROPOFOL;  Surgeon: Jerene Bears, MD;  Location: [facility];  Service: Gastroenterology;  Laterality: N/A;  . [place]; 2007   "1 + 1"  . ESOPHAGOGASTRODUODENOSCOPY (EGD) WITH PROPOFOL N/A 03/13/2019   Procedure: ESOPHAGOGASTRODUODENOSCOPY (EGD) WITH PROPOFOL;  Surgeon: Jerene Bears, MD;  Location: [facility] ENDOSCOPY;  Service: Gastroenterology;  Laterality: N/A;  . EYE SURGERY  Bilateral    [place]  . HEMOSTASIS CLIP PLACEMENT  03/13/2019   Procedure: HEMOSTASIS CLIP PLACEMENT;  Surgeon: Jerene Bears, MD;  Location: [facility] ENDOSCOPY;  Service: Gastroenterology;;  . KNEE ARTHROSCOPY Left 10/25/2006  . POLYPECTOMY  03/13/2019   Procedure: POLYPECTOMY;  Surgeon: Jerene Bears, MD;  Location: [place];  Service: Gastroenterology;;  . RIGHT HEART CATH N/A 07/24/2017   Procedure: RIGHT HEART CATH;  Surgeon: Jolaine Artist, MD;  Location: [facility] CV LAB;  Service: Cardiovascular;  Laterality: N/A;  . RIGHT HEART CATHETERIZATION N/A 09/22/2013   Procedure: RIGHT HEART CATH;  Surgeon: Jolaine Artist, MD;  Location: [facility] CATH LAB;  Service: Cardiovascular;  Laterality: N/A;  . SHOULDER ARTHROSCOPY WITH OPEN ROTATOR CUFF REPAIR Right 03/14/2014   Procedure: RIGHT SHOULDER ARTHROSCOPY WITH BICEPS RELEASE, OPEN SUBSCAPULA REPAIR, OPEN SUPRASPINATUS REPAIR.;  Surgeon: Meredith Pel, MD;  Location: [facility];  Service: Orthopedics;  Laterality: Right;  . TOE AMPUTATION Right 02/24/2018   GREAT TOE AND 2ND TOE AMPUTATION  . TUBAL LIGATION  1970's    Social History:  reports that she quit smoking about 22 years ago. Her smoking use included cigarettes. She has a 96.00 pack-year smoking history. She has never used smokeless tobacco. She reports previous alcohol use. She reports that she does not use drugs.  Allergies:  Allergies  Allergen Reactions  . Codeine Nausea And Vomiting    Family history:  Family History  Problem Relation Age of Onset  . Heart attack Mother 26     Home Meds: Prior to Admission medications   Medication Sig Start Date End Date Taking? Authorizing Provider  albuterol (VENTOLIN HFA) 108 (90 Base) MCG/ACT inhaler TAKE 2 PUFFS BY MOUTH EVERY 6 HOURS AS NEEDED FOR WHEEZE OR SHORTNESS OF BREATH Patient taking differently: Inhale 2 puffs into the lungs every 6 (six) hours as needed for wheezing or shortness of breath.  06/27/19   [place], [name],  MD  allopurinol (ZYLOPRIM) 100 MG tablet TAKE 1 TABLET BY MOUTH TWICE A DAY Patient taking differently: Take 100 mg by mouth 2 (two) times daily.  08/02/19   Newt Minion, MD  Ascorbic Acid (VITAMIN C) 1000 MG tablet Take 1,000 mg by mouth daily.    [provider]  aspirin EC 81 MG tablet Take 81 mg by mouth every morning.     [provider]  carvedilol (COREG) 12.5 MG tablet TAKE 1 TABLET BY MOUTH TWICE A DAY WITH MEALS 10/31/19   [place], [name], MD  cholecalciferol (VITAMIN D) 1000 units tablet Take 1,000 Units by mouth daily with supper.     [provider]  clopidogrel (PLAVIX) 75 MG tablet TAKE 1 TABLET BY MOUTH EVERY DAY WITH BREAKFAST Patient taking differently: Take 75 mg by mouth daily.  05/11/19   Clegg, Amy  D, NP  colchicine 0.6 MG tablet Take 1 tablet (0.6 mg total) by mouth daily. As needed for gout flare Patient not taking: Reported on 01/31/2020 10/14/18   Newt Minion, MD  escitalopram (LEXAPRO) 20 MG tablet TAKE 1 TABLET BY MOUTH EVERY DAY Patient taking differently: Take 20 mg by mouth daily.  05/11/19   [name], MD  ferrous sulfate 325 (65 FE) MG tablet Take 1 tablet (325 mg total) by mouth 2 (two) times daily with a meal. Reported on 08/21/2015 06/07/18   Alycia Rossetti, MD  HYDROcodone-acetaminophen (NORCO) 5-325 MG tablet Take 1 tablet by mouth every 8 (eight) hours as needed for moderate pain. Patient not taking: Reported on 01/31/2020 11/01/19   Alycia Rossetti, MD  Insulin Disposable Pump (OMNIPOD DASH 5 PACK PODS) MISC Inject into the skin. Use continuously with Novolin R - change every 72 hours. 11/18/18   [provider]  insulin NPH Human (HUMULIN N,NOVOLIN N) 100 UNIT/ML injection Inject 20 Units into the skin See admin instructions. Inject 20 units subcutaneously in the morning if CBG >200    [provider]  insulin regular (NOVOLIN R) 100 units/mL injection Inject 7-17 Units into the skin 3 (three) times daily  before meals. Manually add bolus to continuous dose via OmniPod 3 times daily per sliding scale: CBG 80-150 7 units, 151-200 9 units, 201-250 12 units, 251-300 14 units, 301-400 17 units.    [provider]  levothyroxine (SYNTHROID, LEVOTHROID) 50 MCG tablet Take 1 tablet (50 mcg total) by mouth daily before breakfast. 11/07/16   Dena Billet B, PA-C  magnesium oxide (MAG-OX) 400 MG tablet TAKE 1 TABLET BY MOUTH EVERY DAY Patient not taking: Reported on 01/31/2020 09/13/19   Alycia Rossetti, MD  metolazone (ZAROXOLYN) 2.5 MG tablet Take 1 tablet (2.5 mg total) by mouth once a week. Wednesday 12/21/19   Consuelo Pandy, PA-C  Multiple Vitamin (MULTIVITAMIN WITH MINERALS) TABS tablet Take 1 tablet by mouth daily.    [provider]  nitroGLYCERIN (NITROSTAT) 0.4 MG SL tablet Place 1 tablet (0.4 mg total) under the tongue every 5 (five) minutes as needed for chest pain. 04/28/19   Conrad [place], NP  ONETOUCH VERIO test strip  11/02/17   [provider]  pantoprazole (PROTONIX) 40 MG tablet TAKE 1 TABLET BY MOUTH EVERY DAY 01/04/20   [place], [name], MD  potassium chloride SA (KLOR-CON) 20 MEQ tablet Take 2 tablets (40 mEq total) by mouth once a week. On Wednesday with Metolazone Patient not taking: Reported on 01/31/2020 12/21/19 03/20/20  Lyda Jester M, PA-C  pregabalin (LYRICA) 150 MG capsule Take 1 capsule (150 mg total) by mouth 2 (two) times daily. 11/12/16   Alycia Rossetti, MD  torsemide (DEMADEX) 20 MG tablet Take 80 mg by mouth 2 (two) times daily.    [provider]  traZODone (DESYREL) 100 MG tablet TAKE 1 AND 1/2 TABLETS BY MOUTH AT BEDTIME AS NEEDED FOR SLEEP. 01/26/20   Alycia Rossetti, MD  ursodiol (ACTIGALL) 500 MG tablet Take 500 mg by mouth 3 (three) times daily. 09/09/19   [provider]  vitamin E (VITAMIN E) 1000 UNIT capsule Take 1,000 Units by mouth daily with supper.     [provider]    Physical Exam: Vitals:    02/07/20 1104 02/07/20 1113 02/07/20 1116 02/07/20 1159  BP:  (!) 172/70  (!) 158/68  Pulse:  100  100  Resp:  20  Temp:  (!) 97.5 F (36.4 C)  (!) 97.5 F (36.4 C)  TempSrc:  Oral  Oral  SpO2: 98% 98%    Weight:   122.5 kg   Height:   5\' 5"  (1.651 m)    Wt Readings from Last 3 Encounters:  02/07/20 122.5 kg  01/31/20 122.9 kg  12/21/19 (!) 124 kg   Body mass index is 44.93 kg/m.  General exam: Appears calm and comfortable.  Not in physical distress Skin: No rashes, lesions or ulcers.  Clinically dry HEENT: Atraumatic, normocephalic, supple neck, no obvious bleeding Lungs: Clear to auscultation bilaterally CVS: Regular rate and rhythm, no murmur GI/Abd soft, nontender, nondistended, bowel sound present CNS: Alert, awake, oriented x3 Psychiatry: Depressed look Extremities: No pedal edema, no calf tenderness     Consult Orders  (From admission, onward)         Start     Ordered   02/07/20 1247  Consult to hospitalist  ALL PATIENTS BEING ADMITTED/HAVING PROCEDURES NEED COVID-19 SCREENING  Once       Comments: ALL PATIENTS BEING ADMITTED/HAVING PROCEDURES NEED COVID-19 SCREENING  Provider:  (Not yet assigned)  Question Answer Comment  Place call to: [facility],  call [phone]   Reason for Consult Admit      02/07/20 1246          Labs on Admission:   CBC: Recent Labs  Lab 02/06/20 1644 02/07/20 1129  WBC 8.7 8.9  NEUTROABS  --  8.4*  HGB 12.9 12.9  HCT 40.6 43.9  MCV 83.9 91.1  PLT 285 517    Basic Metabolic Panel: Recent Labs  Lab 02/06/20 1644 02/07/20 1129  NA 134* 133*  K 3.5 5.0  CL 96* 93*  CO2 24 12*  GLUCOSE 397* 720*  BUN 30* 41*  CREATININE 1.98* 2.35*  CALCIUM 9.2 9.1    Liver Function Tests: Recent Labs  Lab 02/06/20 1644 02/07/20 1129  AST 11* 12*  ALT 13 15  ALKPHOS 116 128*  BILITOT 0.7 2.0*  PROT 6.4* 6.7  ALBUMIN 3.1* 3.3*   Recent Labs  Lab 02/06/20 1644  LIPASE 23   No results for input(s):  AMMONIA in the last 168 hours.  Cardiac Enzymes: No results for input(s): CKTOTAL, CKMB, CKMBINDEX, TROPONINI in the last 168 hours.  BNP (last 3 results) Recent Labs    05/28/19 1151 12/21/19 1550  BNP 586.5* 219.2*    ProBNP (last 3 results) No results for input(s): PROBNP in the last 8760 hours.  CBG: Recent Labs  Lab 02/06/20 1818 02/07/20 1109 02/07/20 1504 02/07/20 1536  GLUCAP 380* >600* 479* 537*    Lipase     Component Value Date/Time   LIPASE 23 02/06/2020 1644     Urinalysis    Component Value Date/Time   COLORURINE YELLOW 09/26/2019 1505   APPEARANCEUR CLOUDY (A) 09/26/2019 1505   LABSPEC 1.010 09/26/2019 1505   PHURINE 5.0 09/26/2019 1505   GLUCOSEU TRACE (A) 09/26/2019 1505   HGBUR TRACE (A) 09/26/2019 1505   BILIRUBINUR NEGATIVE 09/20/2019 0551   KETONESUR NEGATIVE 09/26/2019 1505   PROTEINUR 3+ (A) 09/26/2019 1505   UROBILINOGEN 0.2 04/01/2014 1659   NITRITE NEGATIVE 09/26/2019 1505   LEUKOCYTESUR NEGATIVE 09/26/2019 1505     Drugs of Abuse     Component Value Date/Time   LABOPIA NONE DETECTED 10/25/2015 1214   COCAINSCRNUR NONE DETECTED 10/25/2015 1214   LABBENZ POSITIVE (A) 10/25/2015 1214   AMPHETMU NONE DETECTED 10/25/2015 1214  THCU NONE DETECTED 10/25/2015 [place] DETECTED 10/25/2015 1214      Radiological Exams on Admission: DG Chest 2 View  Result Date: 02/06/2020 CLINICAL DATA:  Chest pain EXAM: CHEST - 2 VIEW COMPARISON:  Chest x-ray 05/28/2019. FINDINGS: The heart size and mediastinal contours are unchanged. Aortic arch calcifications. No focal consolidation. No pulmonary edema. No pleural effusion. No pneumothorax. No acute osseous abnormality. Upper abdomen surgical clip identified on lateral view. IMPRESSION: No active cardiopulmonary disease. Electronically Signed   By: Iven Finn M.D.   On: 02/06/2020 16:27      ------------------------------------------------------------------------------------------------------ Assessment/Plan: Active Problems:   DKA (diabetic ketoacidoses) ([place])  Diabetic ketoacidosis -Secondary to not using insulin because of compromised appetite.   -pH 7.1, blood sugar level elevated to more than 700. -Management started per DKA protocol with IV insulin drip, IV fluid, electrolyte management. -BMP every 4 hours ordered. -Admit to ICU. Recent Labs  Lab 02/06/20 1644 02/07/20 1129  K 3.5 5.0   Poorly controlled diabetes mellitus type 2 - A1c 11.8 on 7/28 -Home meds include Insulin pump, Premeal insulin. -Currently on insulin drip for DKA. -Continue Lyrica 150 mg twice daily for neuropathy.  Enteropathogenic E. coli diarrhea -Ongoing for last more than a week.  Continue to monitor. -Does not look septic from it.  Continue to avoid antibiotics -IV fluid resuscitation -Imodium as needed  Chronic diastolic CHF/nonischemic cardiomyopathy Essential hypertension -Last echo from 2019 with EF 55 to 34%, grade 1 diastolic dysfunction -Home meds include Coreg 12.5 mg twice daily, torsemide 80 mg twice daily, metolazone 2.5 mg once a week, potassium and magnesium supplement -Currently fluid deprived state.  Resume Coreg.  Keep diuretics on hold.  Monitor electrolytes level.  Coronary artery disease  -status post stents in 2007 -Low risk nuc study in 2018 -Currently with no cardiac symptoms.  -Home meds include Coreg and aspirin 81 mg daily, Plavix 75 mg daily. -Resume all.  AKI on CKD stage 4 -Creatinine elevated to 2.35 today, higher than baseline. -Expect improvement with IV fluids.  Diuretics on hold.  Trend as below. Recent Labs    08/08/19 1415 08/19/19 1409 09/02/19 1137 09/05/19 1024 09/19/19 1320 09/26/19 1428 12/21/19 1550 01/31/20 1536 02/06/20 1644 02/07/20 1129  BUN 96* 74* 94* 82* 39* 56* 44* 55* 30* 41*  CREATININE 2.28* 2.16* 3.00* 2.17*  2.05* 1.91* 2.18* 2.62* 1.98* 2.35*   Morbid obesity - Body mass index is 44.93 kg/m. Patient has been advised to make an attempt to improve diet and exercise patterns to aid in weight loss.  Anxiety/depression -Continue Lexapro 20 mg daily, trazodone 150 mg daily at bedtime,  History of GI bleeding -Continue Protonix 40 mg daily -Hemoglobin stable.  Hypothyroidism -Continue Synthroid 50 mcg daily,  Mobility: Encourage ambulation.  PT eval Code Status:   Code Status: Prior full code DVT prophylaxis:  Lovenox subcu Antimicrobials:  None Fluid: Fluid per DKA protocol  Diet:  Diet Order            Diet NPO time specified  Diet effective now               Advance as tolerated  Consultants: None Family Communication:  Patient communicating by self  Dispo: The patient is from: Home              Anticipated d/c is to: Home hopefully next 2 to 3 days              Anticipated d/c date is:  3 days  ------------------------------------------------------------------------------------- Severity of Illness: The appropriate patient status for this patient is INPATIENT. Inpatient status is judged to be reasonable and necessary in order to provide the required intensity of service to ensure the patient's safety. The patient's presenting symptoms, physical exam findings, and initial radiographic and laboratory data in the context of their chronic comorbidities is felt to place them at high risk for further clinical deterioration. Furthermore, it is not anticipated that the patient will be medically stable for discharge from the hospital within 2 midnights of admission. The following factors support the patient status of inpatient.   " The patient's presenting symptoms include vomiting, diarrhea. " The worrisome physical exam findings include dehydration. " The initial radiographic and laboratory data are worrisome because of DKA, AKI. " The chronic co-morbidities include diabetes  mellitus.   * I certify that at the point of admission it is my clinical judgment that the patient will require inpatient hospital care spanning beyond 2 midnights from the point of admission due to high intensity of service, high risk for further deterioration and high frequency of surveillance required.*  Signed, Terrilee Croak, MD [facility] 02/07/2020

## 2020-02-08 ENCOUNTER — Other Ambulatory Visit: Payer: Self-pay

## 2020-02-08 ENCOUNTER — Inpatient Hospital Stay (HOSPITAL_COMMUNITY): Payer: PPO

## 2020-02-08 DIAGNOSIS — I5031 Acute diastolic (congestive) heart failure: Secondary | ICD-10-CM

## 2020-02-08 LAB — CBC WITH DIFFERENTIAL/PLATELET
Abs Immature Granulocytes: 0.03 10*3/uL (ref 0.00–0.07)
Basophils Absolute: 0 10*3/uL (ref 0.0–0.1)
Basophils Relative: 0 %
Eosinophils Absolute: 0.1 10*3/uL (ref 0.0–0.5)
Eosinophils Relative: 2 %
HCT: 33.4 % — ABNORMAL LOW (ref 36.0–46.0)
Hemoglobin: 10.5 g/dL — ABNORMAL LOW (ref 12.0–15.0)
Immature Granulocytes: 0 %
Lymphocytes Relative: 11 %
Lymphs Abs: 0.8 10*3/uL (ref 0.7–4.0)
MCH: 27.1 pg (ref 26.0–34.0)
MCHC: 31.4 g/dL (ref 30.0–36.0)
MCV: 86.1 fL (ref 80.0–100.0)
Monocytes Absolute: 0.7 10*3/uL (ref 0.1–1.0)
Monocytes Relative: 9 %
Neutro Abs: 5.6 10*3/uL (ref 1.7–7.7)
Neutrophils Relative %: 78 %
Platelets: 210 10*3/uL (ref 150–400)
RBC: 3.88 MIL/uL (ref 3.87–5.11)
RDW: 15.4 % (ref 11.5–15.5)
WBC: 7.2 10*3/uL (ref 4.0–10.5)
nRBC: 0 % (ref 0.0–0.2)

## 2020-02-08 LAB — BASIC METABOLIC PANEL
Anion gap: 9 (ref 5–15)
Anion gap: 8 (ref 5–15)
BUN: 38 mg/dL — ABNORMAL HIGH (ref 8–23)
BUN: 38 mg/dL — ABNORMAL HIGH (ref 8–23)
CO2: 22 mmol/L (ref 22–32)
CO2: 24 mmol/L (ref 22–32)
Calcium: 8.1 mg/dL — ABNORMAL LOW (ref 8.9–10.3)
Calcium: 8.2 mg/dL — ABNORMAL LOW (ref 8.9–10.3)
Chloride: 102 mmol/L (ref 98–111)
Chloride: 103 mmol/L (ref 98–111)
Creatinine, Ser: 1.89 mg/dL — ABNORMAL HIGH (ref 0.44–1.00)
Creatinine, Ser: 1.95 mg/dL — ABNORMAL HIGH (ref 0.44–1.00)
GFR calc Af Amer: 31 mL/min — ABNORMAL LOW (ref >60)
GFR calc Af Amer: 29 mL/min — ABNORMAL LOW (ref >60)
GFR calc non Af Amer: 26 mL/min — ABNORMAL LOW (ref >60)
GFR calc non Af Amer: 25 mL/min — ABNORMAL LOW (ref >60)
Glucose, Bld: 227 mg/dL — ABNORMAL HIGH (ref 70–99)
Glucose, Bld: 173 mg/dL — ABNORMAL HIGH (ref 70–99)
Potassium: 3.1 mmol/L — ABNORMAL LOW (ref 3.5–5.1)
Potassium: 3 mmol/L — ABNORMAL LOW (ref 3.5–5.1)
Sodium: 133 mmol/L — ABNORMAL LOW (ref 135–145)
Sodium: 135 mmol/L (ref 135–145)

## 2020-02-08 LAB — ECHOCARDIOGRAM COMPLETE
AR max vel: 1.41 cm2
AV Area VTI: 1.44 cm2
AV Area mean vel: 1.47 cm2
AV Mean grad: 5.3 mmHg
AV Peak grad: 9.9 mmHg
Ao pk vel: 1.57 m/s
Area-P 1/2: 2.63 cm2
Height: 65 in
S' Lateral: 3.55 cm
Weight: 4320 [oz_av]

## 2020-02-08 LAB — CBG MONITORING, ED
Glucose-Capillary: 220 mg/dL — ABNORMAL HIGH (ref 70–99)
Glucose-Capillary: 216 mg/dL — ABNORMAL HIGH (ref 70–99)
Glucose-Capillary: 170 mg/dL — ABNORMAL HIGH (ref 70–99)
Glucose-Capillary: 165 mg/dL — ABNORMAL HIGH (ref 70–99)
Glucose-Capillary: 152 mg/dL — ABNORMAL HIGH (ref 70–99)
Glucose-Capillary: 130 mg/dL — ABNORMAL HIGH (ref 70–99)
Glucose-Capillary: 159 mg/dL — ABNORMAL HIGH (ref 70–99)
Glucose-Capillary: 347 mg/dL — ABNORMAL HIGH (ref 70–99)
Glucose-Capillary: 308 mg/dL — ABNORMAL HIGH (ref 70–99)

## 2020-02-08 LAB — MAGNESIUM: Magnesium: 1.7 mg/dL (ref 1.7–2.4)

## 2020-02-08 LAB — BETA-HYDROXYBUTYRIC ACID: Beta-Hydroxybutyric Acid: 0.23 mmol/L (ref 0.05–0.27)

## 2020-02-08 LAB — PHOSPHORUS: Phosphorus: 1.4 mg/dL — ABNORMAL LOW (ref 2.5–4.6)

## 2020-02-08 LAB — GLUCOSE, CAPILLARY: Glucose-Capillary: 285 mg/dL — ABNORMAL HIGH (ref 70–99)

## 2020-02-08 MED ORDER — POTASSIUM PHOSPHATES 15 MMOLE/5ML IV SOLN
30.0000 mmol | Freq: Once | INTRAVENOUS | Status: AC
  Administered 2020-02-08: 30 mmol via INTRAVENOUS
  Filled 2020-02-08: qty 10

## 2020-02-08 MED ORDER — INSULIN ASPART 100 UNIT/ML ~~LOC~~ SOLN
0.0000 [IU] | Freq: Every day | SUBCUTANEOUS | Status: DC
  Administered 2020-02-08: 3 [IU] via SUBCUTANEOUS

## 2020-02-08 MED ORDER — INSULIN ASPART 100 UNIT/ML ~~LOC~~ SOLN
0.0000 [IU] | Freq: Three times a day (TID) | SUBCUTANEOUS | Status: DC
  Administered 2020-02-08 – 2020-02-09 (×2): 7 [IU] via SUBCUTANEOUS
  Administered 2020-02-09: 8 [IU] via SUBCUTANEOUS
  Administered 2020-02-10: 2 [IU] via SUBCUTANEOUS
  Administered 2020-02-10 (×2): 3 [IU] via SUBCUTANEOUS
  Filled 2020-02-08: qty 1

## 2020-02-08 MED ORDER — SODIUM CHLORIDE 0.9 % IV SOLN: INTRAVENOUS | Status: DC

## 2020-02-08 MED ORDER — PHOSPHA 250 NEUTRAL 155-852-130 MG PO TABS
500.0000 mg | ORAL_TABLET | Freq: Once | ORAL | Status: AC
  Administered 2020-02-08: 500 mg via ORAL
  Filled 2020-02-08: qty 2

## 2020-02-08 MED ORDER — LOPERAMIDE HCL 2 MG PO CAPS
2.0000 mg | ORAL_CAPSULE | Freq: Three times a day (TID) | ORAL | Status: DC
  Administered 2020-02-08 – 2020-02-10 (×6): 2 mg via ORAL
  Filled 2020-02-08 (×6): qty 1

## 2020-02-08 MED ORDER — POTASSIUM CHLORIDE CRYS ER 20 MEQ PO TBCR
40.0000 meq | EXTENDED_RELEASE_TABLET | Freq: Once | ORAL | Status: AC
  Administered 2020-02-08: 40 meq via ORAL
  Filled 2020-02-08: qty 2

## 2020-02-08 MED ORDER — INSULIN GLARGINE 100 UNIT/ML ~~LOC~~ SOLN
20.0000 [IU] | Freq: Every day | SUBCUTANEOUS | Status: DC
  Filled 2020-02-08: qty 0.2

## 2020-02-08 MED ORDER — INSULIN GLARGINE 100 UNIT/ML ~~LOC~~ SOLN
20.0000 [IU] | Freq: Every day | SUBCUTANEOUS | Status: DC
  Administered 2020-02-08: 20 [IU] via SUBCUTANEOUS
  Filled 2020-02-08 (×3): qty 0.2

## 2020-02-08 MED ORDER — INSULIN ASPART 100 UNIT/ML ~~LOC~~ SOLN
6.0000 [IU] | Freq: Three times a day (TID) | SUBCUTANEOUS | Status: DC
  Administered 2020-02-08: 6 [IU] via SUBCUTANEOUS
  Filled 2020-02-08: qty 1

## 2020-02-08 MED ORDER — MAGNESIUM SULFATE 2 GM/50ML IV SOLN
2.0000 g | Freq: Once | INTRAVENOUS | Status: AC
  Administered 2020-02-08: 2 g via INTRAVENOUS
  Filled 2020-02-08: qty 50

## 2020-02-08 MED ORDER — PROCHLORPERAZINE EDISYLATE 10 MG/2ML IJ SOLN
10.0000 mg | Freq: Four times a day (QID) | INTRAMUSCULAR | Status: DC | PRN
  Administered 2020-02-08 (×2): 10 mg via INTRAVENOUS
  Filled 2020-02-08 (×2): qty 2

## 2020-02-08 NOTE — ED Notes (Signed)
Dr Pietro Cassis paged.

## 2020-02-08 NOTE — TOC Initial Note (Signed)
Transition of Care [facility]) - Initial/Assessment Note    Patient Details  Name: Susan [name] MRN: [id number] Date of Birth: [birth year]  [facility] CM/SW Contact:    Salome Arnt, [place] Phone Number: 02/08/2020, 8:35 AM  Clinical Narrative:   Pt admitted with DKA. LCSW completed assessment due to high risk readmission score. Assessment completed with husband on phone. Percell Miller reports that pt's son lives with them about 5 days a week. Pt's husband works during the day, but feels pt typically manages okay at home. She has some balance issues related to toe amputations. Pt ambulates with walker at home. She has had [facility] PT/RN in the past. LCSW discussed d/c planning with pt's husband. He states pt will return home at d/c. [facility] will follow for possible home health needs.                 Expected Discharge Plan: Home/Self Care Barriers to Discharge: Continued Medical Work up   Patient Goals and CMS Choice Patient states their goals for this hospitalization and ongoing recovery are:: return home      Expected Discharge Plan and Services Expected Discharge Plan: Home/Self Care In-house Referral: Clinical Social Work     Living arrangements for the past 2 months: Single Family Home                                      Prior Living Arrangements/Services Living arrangements for the past 2 months: Single Family Home Lives with:: Spouse, Adult Children Patient language and need for interpreter reviewed:: Yes Do you feel safe going back to the place where you live?: Yes      Need for Family Participation in Patient Care: Yes (Comment) Care giver support system in place?: Yes (comment) Current home services: DME (walker, pure wick) Criminal Activity/Legal Involvement Pertinent to Current Situation/Hospitalization: No - Comment as needed  Activities of Daily Living      Permission Sought/Granted                  Emotional Assessment    Attitude/Demeanor/Rapport: Unable to Assess Affect (typically observed): Unable to Assess   Alcohol / Substance Use: Not Applicable Psych Involvement: No (comment)  Admission diagnosis:  DKA (diabetic ketoacidoses) ([place]) [E11.10] Patient Active Problem List   Diagnosis Date Noted  . DKA (diabetic ketoacidoses) ([place]) 02/07/2020  . Common bile duct (CBD) obstruction 05/28/2019  . Benign neoplasm of ascending colon   . Benign neoplasm of transverse colon   . Benign neoplasm of descending colon   . Benign neoplasm of sigmoid colon   . Gastric polyps   . Hyperkalemia 03/11/2019  . Prolonged QT interval 03/11/2019  . Onychomycosis 06/21/2018  . Pressure injury of skin 03/19/2018  . Syncope 03/18/2018  . Osteomyelitis of second toe of right foot ([place])   . Toe osteomyelitis, right ([place])   . 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 ([place])   . PVD (peripheral vascular disease) ([place]) 10/26/2017  . Hypothyroid 07/27/2017  . PAH (pulmonary artery hypertension) ([place])   . Impaired ambulation 07/19/2017  . Leg cramps 02/27/2017  . Peripheral edema 01/12/2017  . Diabetic neuropathy ([place]) 11/12/2016  . 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 mellitus ([place]) 10/24/2015  . Anemia 10/03/2015  . Generalized anxiety disorder 10/03/2015  . Insomnia 10/03/2015  . Chest pain   . Gastritis 06/07/2015  . Diabetes mellitus  type II, uncontrolled ([place]) 06/07/2015  . Chronic diastolic CHF (congestive heart failure) ([place]) 06/07/2015  . Non compliance with medical treatment 04/17/2014  . Rotator cuff tear 03/14/2014  . Morbid obesity ([place]) 09/23/2013  . Acute kidney injury superimposed on chronic kidney disease ([place]) 12/25/2012  . Hypotension 12/25/2012  . Urinary incontinence   . MDD (major depressive disorder) 11/12/2010  . RBBB (right bundle branch block)   . CAD (coronary artery disease)   . Hyperlipemia 01/22/2009  . Essential hypertension 01/22/2009   PCP:  Alycia Rossetti,  MD Pharmacy:   [id number] - [place], [place] [address] [place] [address] Phone: [phone] Fax: [phone]     Social Determinants of Health (SDOH) Interventions    Readmission Risk Interventions Readmission Risk Prevention Plan 02/08/2020  Transportation Screening Complete  Medication Review ([place]) Complete  HRI or [place] Complete  SW Recovery Care/Counseling Consult Complete  Palliative Care Screening Not [place] Not Applicable  Some recent data might be hidden

## 2020-02-08 NOTE — ED Notes (Signed)
Pharmacy called for Lantus

## 2020-02-08 NOTE — Progress Notes (Addendum)
Inpatient Diabetes Program Recommendations  AACE/ADA: New Consensus Statement on Inpatient Glycemic Control (2015)  Target Ranges:  Prepandial:   less than 140 mg/dL      Peak postprandial:   less than 180 mg/dL (1-2 hours)      Critically ill patients:  140 - 180 mg/dL   Lab Results  Component Value Date   GLUCAP 159 (H) 02/08/2020   HGBA1C 11.8 (H) 12/21/2019   Diabetes history: DM 2 Outpatient Diabetes medications: NPH 20 units bid (if blood sugar>200 mg/dL).  Uses Omnipod for boluses with meals (7-17 units tid with meals) Current orders for Inpatient glycemic control: Lantus 20 units daily, Novolog sensitive tid with meals and HS  Inpatient Diabetes Program Recommendations:    Spoke with patient by phone.  She states that she does not have insulin pump with her (Omnipod).  She states that she takes bolus insulin on a scale 7-17 units with each meal (she uses Regular insulin in insulin pump).  She also takes NPH 20 units bid if blood sugar is >200 mg/dL.   Recommend adding Novolog meal coverage 5 units tid with meals.  May need increase in Lantus based on fasting CBG's as well.  Discussed with patient as well.  It appears that she needs improved glycemic control and F/U with Dr. [name].    Thanks,  Adah Perl, RN, BC-ADM Inpatient Diabetes Coordinator Pager [phone] (8a-5p)

## 2020-02-08 NOTE — Progress Notes (Signed)
*  PRELIMINARY RESULTS* Echocardiogram 2D Echocardiogram has been performed.  Leavy Cella 02/08/2020, 10:57 AM

## 2020-02-08 NOTE — Progress Notes (Signed)
PROGRESS NOTE  Susan [name]  DOB: 19-Nov-1950  PCP: Alycia Rossetti, MD VVO:[id number]  DOA: 02/07/2020  LOS: 1 day   Chief Complaint  Patient presents with  . Hyperglycemia   Brief narrative: Susan [name] is a 69 y.o. female with PMH significant for DM 2, HTN, HLD, CAD, MI status post stents, chronic diastolic CHF TIA, CKD 4, morbid obesity, right transmetatarsal amputation, hypothyroidism.   Patient states she has nausea, vomiting and diarrhea for last 10 days.  She saw GI Dr. Collene Mares a week ago.  Patient reports stool studies showed enteropathogenic E. coli.  Antibiotics were not thought to be helpful and were not started. Patient also reports that because of poor oral intake she stopped taking her insulin pump few days prior to presentation. She got progressive generalized weakness.  She presented to the ED yesterday on 9/13 but had to wait several hours and hence left without being seen.  Her blood sugar this morning at home was significantly elevated and hence EMS was called. Patient gave herself 10 units of fast acting insulin. Patient was recently diagnosed with E. coli UTI.  She apparently came to the ED yesterday but left after 5 hours of waiting.   In the ED, patient's blood sugar level was more than 600. Labs with sodium 133, potassium 5, BUN/creatinine 41/2.35.  CBC normal. Blood gas with pH of 7.21, PCO2 33, HCO3 10.8. Patient was started on IV insulin drip, IV fluid. Hospital service was consulted for management of DKA.  Subjective: Patient was seen and examined this afternoon. Still waiting in ED for inpatient bed availability. Feels better.  Last bowel movement this morning.  It is still loose.  Did not have any overnight. She complains that she starts having bowel movement after eating anything Liberated off insulin drip this morning  Assessment/Plan: Diabetic ketoacidosis -Secondary to not using insulin because of poor appetite.   -pH 7.1, blood sugar level  elevated to more than 700. -Management done per DKA protocol with IV insulin drip, IV fluid, electrolyte correction -Taken off insulin drip this morning.  Switched to subcutaneous insulin.  Poorly controlled diabetes mellitus type 2 - A1c 11.8 on 7/28 -Home meds include Insulin pump, Premeal insulin. -Switched from insulin drip to subcutaneous insulin this morning.  Given 20 units of Lantus this morning.  Started on 6 units 3 times daily of NovoLog with meals and sliding scale insulin with Accu-Cheks. -Continue Lyrica 150 mg twice daily for neuropathy.  Enteropathogenic E. coli diarrhea -Ongoing for last more than a week.  Not septic. -Not on antibiotics.  Will start her on scheduled Imodium for next 1 to 2 days. -IV fluid resuscitation to continue  Chronic diastolic CHF/nonischemic cardiomyopathy Essential hypertension -Last echo from 2019 with EF 55 to 26%, grade 1 diastolic dysfunction -Home meds include Coreg 12.5 mg twice daily, torsemide 80 mg twice daily, metolazone 2.5 mg once a week, potassium and magnesium supplement -Currently continue Coreg.  Keep diuretics on hold.  Continue to monitor electrolytes level.  Hypokalemia/hypomagnesemia/hypophosphatemia -Potassium low at 3, magnesium low at 1.7 phosphorus low at 1.4 this morning.  Replacement ordered.  Recheck tomorrow. Recent Labs  Lab 02/07/20 1129 02/07/20 1655 02/07/20 2116 02/08/20 0101 02/08/20 0536  K 5.0 4.0 3.6 3.1* 3.0*  MG  --   --   --   --  1.7  PHOS  --   --   --   --  1.4*   Coronary artery disease  -status post stents in  2007 -Low risk nuclear study in 2018 -Currently with no cardiac symptoms.  -Continue Coreg and aspirin 81 mg daily, Plavix 75 mg daily.  AKI on CKD stage 4 -Creatinine elevated to 2.35 today, higher than baseline. -Improving.  Diuretics on hold.  Continue IV fluid.  Creatinine trend as below. Recent Labs    09/19/19 1320 09/26/19 1428 12/21/19 1550 01/31/20 1536 02/06/20 1644  02/07/20 1129 02/07/20 1655 02/07/20 2116 02/08/20 0101 02/08/20 0536  BUN 39* 56* 44* 55* 30* 41* 38* 38* 38* 38*  CREATININE 2.05* 1.91* 2.18* 2.62* 1.98* 2.35* 2.05* 1.96* 1.89* 1.95*   Morbid obesity - Body mass index is 44.93 kg/m. Patient has been advised to make an attempt to improve diet and exercise patterns to aid in weight loss.  Anxiety/depression -Continue Lexapro 20 mg daily, trazodone 150 mg daily at bedtime,  History of GI bleeding -Continue Protonix 40 mg daily -Hemoglobin stable.  Hypothyroidism -Continue Synthroid 50 mcg daily,  Mobility: Encourage ambulation.  PT eval Code Status:   Code Status: Full Code full code DVT prophylaxis:  Lovenox subcu Antimicrobials:  None Fluid: Fluid per DKA protocol  Diet:  Diet Order            Diet heart healthy/carb modified Room service appropriate? Yes; Fluid consistency: Thin  Diet effective now                Consultants: None Family Communication:  Called and updated patient's husband on the phone  Status is: Inpatient  Remains inpatient appropriate because:Persistent severe electrolyte disturbances and IV treatments appropriate due to intensity of illness or inability to take PO   Dispo: The patient is from: Home              Anticipated d/c is to: Home likely.  Pending PT eval              Anticipated d/c date is: 2 days              Patient currently is not medically stable to d/c.       Infusions:  . sodium chloride 100 mL/hr at 02/08/20 0907  . insulin Stopped (02/08/20 1257)  . lactated ringers Stopped (02/07/20 2351)  . potassium PHOSPHATE IVPB (in mmol) 30 mmol (02/08/20 1027)    Scheduled Meds: . allopurinol  100 mg Oral BID  . vitamin C  1,000 mg Oral Daily  . aspirin EC  81 mg Oral q morning - 10a  . carvedilol  12.5 mg Oral BID WC  . cholecalciferol  1,000 Units Oral Q supper  . clopidogrel  75 mg Oral Daily  . enoxaparin (LOVENOX) injection  40 mg Subcutaneous Q12H  . ferrous  sulfate  325 mg Oral BID WC  . insulin aspart  0-5 Units Subcutaneous QHS  . insulin aspart  0-9 Units Subcutaneous TID WC  . insulin aspart  6 Units Subcutaneous TID WC  . insulin glargine  20 Units Subcutaneous Daily  . levothyroxine  50 mcg Oral QAC breakfast  . loperamide  2 mg Oral Q8H  . pantoprazole  40 mg Oral Daily  . pregabalin  150 mg Oral BID  . sodium chloride flush  3 mL Intravenous Q12H    Antimicrobials: Anti-infectives (From admission, onward)   None      PRN meds: dextrose, hydrALAZINE, nitroGLYCERIN, prochlorperazine   Objective: Vitals:   02/08/20 1200 02/08/20 1500  BP: (!) 117/53 108/68  Pulse: 72 82  Resp: 14 18  Temp:  SpO2: 97% 97%    Intake/Output Summary (Last 24 hours) at 02/08/2020 1520 Last data filed at 02/08/2020 1257 Gross per 24 hour  Intake 3729.98 ml  Output --  Net 3729.98 ml   Filed Weights   02/07/20 1116  Weight: 122.5 kg   Weight change:  Body mass index is 44.93 kg/m.   Physical Exam: General exam: Appears calm and comfortable.  Not in physical distress Skin: No rashes, lesions or ulcers. HEENT: Atraumatic, normocephalic, supple neck, no obvious bleeding Lungs: Clear to auscultation bilaterally CVS: Regular rate and rhythm, no murmur GI/Abd soft, nontender, nondistended, bowel sound present CNS: Alert, awake, oriented x3 Psychiatry: Mood appropriate Extremities: No pedal edema, no calf tenderness  Data Review: I have personally reviewed the laboratory data and studies available.  Recent Labs  Lab 02/06/20 1644 02/07/20 1129 02/08/20 0536  WBC 8.7 8.9 7.2  NEUTROABS  --  8.4* 5.6  HGB 12.9 12.9 10.5*  HCT 40.6 43.9 33.4*  MCV 83.9 91.1 86.1  PLT 285 257 210   Recent Labs  Lab 02/07/20 1129 02/07/20 1655 02/07/20 2116 02/08/20 0101 02/08/20 0536  NA 133* 135 134* 133* 135  K 5.0 4.0 3.6 3.1* 3.0*  CL 93* 101 99 102 103  CO2 12* 12* 18* 22 24  GLUCOSE 720* 497* 356* 227* 173*  BUN 41* 38* 38* 38*  38*  CREATININE 2.35* 2.05* 1.96* 1.89* 1.95*  CALCIUM 9.1 8.4* 8.4* 8.1* 8.2*  MG  --   --   --   --  1.7  PHOS  --   --   --   --  1.4*    F/u labs ordered for tomorrow  Signed, Terrilee Croak, MD [facility] 02/08/2020

## 2020-02-09 LAB — GLUCOSE, CAPILLARY
Glucose-Capillary: 446 mg/dL — ABNORMAL HIGH (ref 70–99)
Glucose-Capillary: 388 mg/dL — ABNORMAL HIGH (ref 70–99)
Glucose-Capillary: 337 mg/dL — ABNORMAL HIGH (ref 70–99)
Glucose-Capillary: 198 mg/dL — ABNORMAL HIGH (ref 70–99)

## 2020-02-09 LAB — CBC WITH DIFFERENTIAL/PLATELET
Abs Immature Granulocytes: 0.02 10*3/uL (ref 0.00–0.07)
Basophils Absolute: 0 10*3/uL (ref 0.0–0.1)
Basophils Relative: 1 %
Eosinophils Absolute: 0.4 10*3/uL (ref 0.0–0.5)
Eosinophils Relative: 7 %
HCT: 36.3 % (ref 36.0–46.0)
Hemoglobin: 11 g/dL — ABNORMAL LOW (ref 12.0–15.0)
Immature Granulocytes: 0 %
Lymphocytes Relative: 10 %
Lymphs Abs: 0.5 10*3/uL — ABNORMAL LOW (ref 0.7–4.0)
MCH: 26.8 pg (ref 26.0–34.0)
MCHC: 30.3 g/dL (ref 30.0–36.0)
MCV: 88.3 fL (ref 80.0–100.0)
Monocytes Absolute: 0.4 10*3/uL (ref 0.1–1.0)
Monocytes Relative: 7 %
Neutro Abs: 4 10*3/uL (ref 1.7–7.7)
Neutrophils Relative %: 75 %
Platelets: 159 10*3/uL (ref 150–400)
RBC: 4.11 MIL/uL (ref 3.87–5.11)
RDW: 15.8 % — ABNORMAL HIGH (ref 11.5–15.5)
WBC: 5.4 10*3/uL (ref 4.0–10.5)
nRBC: 0 % (ref 0.0–0.2)

## 2020-02-09 LAB — PHOSPHORUS: Phosphorus: 3.2 mg/dL (ref 2.5–4.6)

## 2020-02-09 LAB — BASIC METABOLIC PANEL
Anion gap: 9 (ref 5–15)
BUN: 41 mg/dL — ABNORMAL HIGH (ref 8–23)
CO2: 20 mmol/L — ABNORMAL LOW (ref 22–32)
Calcium: 8.1 mg/dL — ABNORMAL LOW (ref 8.9–10.3)
Chloride: 102 mmol/L (ref 98–111)
Creatinine, Ser: 2.57 mg/dL — ABNORMAL HIGH (ref 0.44–1.00)
GFR calc Af Amer: 21 mL/min — ABNORMAL LOW (ref >60)
GFR calc non Af Amer: 18 mL/min — ABNORMAL LOW (ref >60)
Glucose, Bld: 433 mg/dL — ABNORMAL HIGH (ref 70–99)
Potassium: 3.9 mmol/L (ref 3.5–5.1)
Sodium: 131 mmol/L — ABNORMAL LOW (ref 135–145)

## 2020-02-09 LAB — MAGNESIUM: Magnesium: 2.1 mg/dL (ref 1.7–2.4)

## 2020-02-09 MED ORDER — INSULIN ASPART 100 UNIT/ML ~~LOC~~ SOLN
15.0000 [IU] | Freq: Once | SUBCUTANEOUS | Status: AC
  Administered 2020-02-09: 15 [IU] via SUBCUTANEOUS

## 2020-02-09 MED ORDER — INSULIN ASPART 100 UNIT/ML ~~LOC~~ SOLN
10.0000 [IU] | Freq: Three times a day (TID) | SUBCUTANEOUS | Status: DC
  Administered 2020-02-09 – 2020-02-10 (×4): 10 [IU] via SUBCUTANEOUS

## 2020-02-09 MED ORDER — INSULIN GLARGINE 100 UNIT/ML ~~LOC~~ SOLN
30.0000 [IU] | Freq: Every day | SUBCUTANEOUS | Status: DC
  Administered 2020-02-09: 30 [IU] via SUBCUTANEOUS
  Filled 2020-02-09 (×2): qty 0.3

## 2020-02-09 MED ORDER — AZITHROMYCIN 250 MG PO TABS
500.0000 mg | ORAL_TABLET | Freq: Every day | ORAL | Status: DC
  Administered 2020-02-09 – 2020-02-10 (×2): 500 mg via ORAL
  Filled 2020-02-09 (×2): qty 2

## 2020-02-09 MED ORDER — INSULIN ASPART 100 UNIT/ML ~~LOC~~ SOLN
8.0000 [IU] | Freq: Three times a day (TID) | SUBCUTANEOUS | Status: DC
  Administered 2020-02-09: 8 [IU] via SUBCUTANEOUS

## 2020-02-09 MED ORDER — INSULIN GLARGINE 100 UNIT/ML ~~LOC~~ SOLN
25.0000 [IU] | Freq: Two times a day (BID) | SUBCUTANEOUS | Status: DC
  Administered 2020-02-09 – 2020-02-10 (×2): 25 [IU] via SUBCUTANEOUS
  Filled 2020-02-09 (×8): qty 0.25

## 2020-02-09 MED ORDER — ENOXAPARIN SODIUM 30 MG/0.3ML ~~LOC~~ SOLN
30.0000 mg | SUBCUTANEOUS | Status: DC
  Administered 2020-02-10: 30 mg via SUBCUTANEOUS
  Filled 2020-02-09: qty 0.3

## 2020-02-09 NOTE — Evaluation (Addendum)
Physical Therapy Evaluation Patient Details Name: Susan [name] MRN: [id number] DOB: [birth year] Today's Date: 02/09/2020   History of Present Illness  History of Present Illness:Susan [name] is a 69 y.o. female with PMH significant for DM 2, HTN, HLD, CAD, MI status post stents, chronic diastolic CHF TIA, CKD 4, morbid obesity, right transmetatarsal amputation, hypothyroidism.  Patient states she has nausea, vomiting and diarrhea for last 10 days.  She saw GI Dr. Collene Mares a week ago.  Patient reports stool studies showed enteropathogenic E. coli.  Antibiotics were not thought to be helpful and were not started.Patient also reports that because of poor oral intake she stopped taking her insulin a week ago.  She got progressive generalized weakness. She presented to the ED yesterday on 9/13 but had to wait several hours and hence left without being seen.  Her blood sugar this morning at home was significantly elevated and hence EMS was called. Patient gave herself 10 units of fast acting insulin.Patient was recently diagnosed with E. coli UTI.  She apparently came to the ED yesterday but left after 5 hours of waiting. In the ED, patient's blood sugar level was more than 600.Labs with sodium 133, potassium 5, BUN/creatinine 41/2.35.  CBC normal.Blood gas with pH of 7.21, PCO2 33, HCO3 10.8.Patient was started on IV insulin drip, IV fluid.Hospital service was consulted for management of DKA.    Clinical Impression  The patient's O2 was 96% 1.5L at rest and 95% on RA at the start of the session today. The patient was able to perform supine to sit transfer with supervision assistance, and demonstrated heavy reliance on bedrails in order to achieve sitting EOB. She tolerated sitting EOB without SOB or LOB. She performed sit to stand min assist with RW and proceeded to ambulate 50 feet RW min guard. She displayed slow labored movements with O2 at 90% on RA but without SOB. After ambulation her O2 dropped to 87%, 1.5LPM  reapplied and O2 raised to 96% after 1 minute. Patient tolerated sitting up in chair after therapy- RN notified. PLAN: The patient will continue to benefit from skilled physical therapy services in hospital at recommended venue below in order to improve balance, gait, and ADL's to promote independence in functional activities.      Follow Up Recommendations Home health PT;Supervision for mobility/OOB    Equipment Recommendations  None recommended by PT    Recommendations for Other Services       Precautions / Restrictions Precautions Precautions: Fall Restrictions Weight Bearing Restrictions: No      Mobility  Bed Mobility Overal bed mobility: Needs Assistance Bed Mobility: Supine to Sit     Supine to sit: Min guard;Supervision     General bed mobility comments: heavy reliance on BUE using bed rails for assistance; slow labored movement; sitting EOB  Transfers Overall transfer level: Needs assistance Equipment used: Rolling walker (2 wheeled) Transfers: Sit to/from Stand Sit to Stand: Min assist         General transfer comment: desat 96%-91% upon standing on RA, pt asymptomatic, ambulation continued on RA  Ambulation/Gait Ambulation/Gait assistance: Min guard Gait Distance (Feet): 50 Feet Assistive device: Rolling walker (2 wheeled) Gait Pattern/deviations: Step-through pattern;Decreased stride length;WFL(Within Functional Limits);Trunk flexed Gait velocity: decreased   General Gait Details: no signs of SOB w ambulation  Stairs            Wheelchair Mobility    Modified Rankin (Stroke Patients Only)       Balance Overall balance assessment: Needs  assistance Sitting-balance support: Feet supported;No upper extremity supported Sitting balance-Leahy Scale: Good Sitting balance - Comments: sitting EOB   Standing balance support: Bilateral upper extremity supported;During functional activity Standing balance-Leahy Scale: Good Standing balance  comment: BUE for weight acceptance w RW                             Pertinent Vitals/Pain Pain Assessment: 0-10 Pain Score: 7  Pain Location: abdomen Pain Descriptors / Indicators: Discomfort;Nagging Pain Intervention(s): Limited activity within patient's tolerance;Monitored during session    Home Living Family/patient expects to be discharged to:: Private residence Living Arrangements: Spouse/significant other;Children Available Help at Discharge: Family;Available 24 hours/day Type of Home: Mobile home Home Access: Ramped entrance     Home Layout: One level Home Equipment: Grab bars - tub/shower;Walker - 2 wheels;Walker - 4 wheels;Wheelchair - manual;Bedside commode;Cane - quad Additional Comments: uses RW for household and limited community ambulation; uses bed side commode for toileting needs    Prior Function Level of Independence: Needs assistance   Gait / Transfers Assistance Needed: husband and son helps with OOB mobility and transfers  ADL's / Homemaking Assistance Needed: husband helps get into shower, pt independent with dressing; drives limited distances, son helps driving longer distances and also assists with shopping        Hand Dominance   Dominant Hand: Right    Extremity/Trunk Assessment   Upper Extremity Assessment Upper Extremity Assessment: Generalized weakness    Lower Extremity Assessment Lower Extremity Assessment: Generalized weakness    Cervical / Trunk Assessment Cervical / Trunk Assessment: Normal  Communication   Communication: No difficulties  Cognition Arousal/Alertness: Awake/alert Behavior During Therapy: WFL for tasks assessed/performed Overall Cognitive Status: Within Functional Limits for tasks assessed                                        General Comments      Exercises     Assessment/Plan    PT Assessment Patient needs continued PT services  PT Problem List Decreased strength;Decreased  mobility;Decreased range of motion;Obesity;Cardiopulmonary status limiting activity;Decreased balance;Pain       PT Treatment Interventions DME instruction;Therapeutic exercise;Gait training;Balance training;Functional mobility training;Therapeutic activities;Patient/family education    PT Goals (Current goals can be found in the Care Plan section)  Acute Rehab PT Goals Patient Stated Goal: go home PT Goal Formulation: With patient Time For Goal Achievement: 02/16/20 Potential to Achieve Goals: Good    Frequency Min 3X/week   Barriers to discharge        Co-evaluation               AM-PAC PT "6 Clicks" Mobility  Outcome Measure Help needed turning from your back to your side while in a flat bed without using bedrails?: None Help needed moving from lying on your back to sitting on the side of a flat bed without using bedrails?: A Little Help needed moving to and from a bed to a chair (including a wheelchair)?: A Little Help needed standing up from a chair using your arms (e.g., wheelchair or bedside chair)?: None Help needed to walk in hospital room?: A Little Help needed climbing 3-5 steps with a railing? : A Lot 6 Click Score: 19    End of Session Equipment Utilized During Treatment: Gait belt;Oxygen Activity Tolerance: Patient tolerated treatment well Patient left: in chair;with call  bell/phone within reach Nurse Communication: Mobility status PT Visit Diagnosis: Unsteadiness on feet (R26.81);History of falling (Z91.81);Muscle weakness (generalized) (M62.81);Pain Pain - Right/Left:  (abdomen)    Time: 8138-8719 PT Time Calculation (min) (ACUTE ONLY): 30 min   Charges:   PT Evaluation $PT Eval Moderate Complexity: 1 Mod PT Treatments $Therapeutic Activity: 23-37 mins        11:58 AM , 02/09/20 Karlyn Agee, SPT Physical Therapy with [facility] [phone] office   During this treatment session, the therapist was present,  participating in and directing the treatment.  11:58 AM, 02/09/20 Lonell Grandchild, MPT Physical Therapist with [facility] 336 [phone] office [phone] mobile phone

## 2020-02-09 NOTE — Progress Notes (Addendum)
Inpatient Diabetes Program Recommendations  AACE/ADA: New Consensus Statement on Inpatient Glycemic Control (2015)  Target Ranges:  Prepandial:   less than 140 mg/dL      Peak postprandial:   less than 180 mg/dL (1-2 hours)      Critically ill patients:  140 - 180 mg/dL   Lab Results  Component Value Date   GLUCAP 446 (H) 02/09/2020   HGBA1C 11.8 (H) 12/21/2019    Review of Glycemic Control Results for Susan [name], Susan [name] (MRN [id number]) as of 02/09/2020 12:07  Ref. Range 02/08/2020 19:10 02/08/2020 21:14 02/09/2020 08:54  Glucose-Capillary Latest Ref Range: 70 - 99 mg/dL 308 (H) 285 (H) 446 (H)   Diabetes history: DM 2 Outpatient Diabetes medications:NPH 20 units bid (if blood sugar>200 mg/dL). Uses Omnipod for boluses with meals (7-17 units tid with meals) Current orders for Inpatient glycemic control: Lantus 30 units daily, Novolog sensitive tid with meals and HS, Novolog 8 units tid with meals Inpatient Diabetes Program Recommendations:     Referral received for possible insulin pump restart- Patient has received Lantus 30 units this morning therefore do not recommend restarting insulin pump until 02/10/20.  Recommend waiting to restart insulin pump until 02/10/20 since patient has Lantus on board.  I have called Dr. Almetta Lovely office to get insulin pump settings as well and to clarify if patient is receiving basal dose of insulin with pump.    Will follow closely.   Thanks  Adah Perl, RN, BC-ADM Inpatient Diabetes Coordinator Pager [phone] (8a-5p)  Spoke to Dr. [name] regarding patient's insulin pump settings.  She wears Omnipod with Regular insulin. MN- 1.1 units/hr 4a- 1.1 units/hr 0830a-1.65 units/hr 0330p- 1.4 units/hr 7:30p- 1.0 units/hr Total basal: 32 units/24 hours  She also boluses 0-17 units tid with meals. Patient also takes an extra 20 units of NPH twice daily if blood sugar>200 mg/dL.  Recommend increasing Lantus to 25 units bid and increase Novolog meal  coverage to 10 units tid with meals.  Pump may be restarted tomorrow morning if appropriate.   Thanks,  Adah Perl, RN, BC-ADM Inpatient Diabetes Coordinator Pager [phone] (8a-5p)

## 2020-02-09 NOTE — Progress Notes (Addendum)
PROGRESS NOTE  Susan [name]  DOB: Nov 16, 1950  PCP: Alycia Rossetti, MD ACZ:[id number]  DOA: 02/07/2020  LOS: 1 day   Chief Complaint  Patient presents with  . Hyperglycemia   Brief narrative: Susan [name] is a 69 y.o. female with PMH significant for DM 2, HTN, HLD, CAD, MI status post stents, chronic diastolic CHF TIA, CKD 4, morbid obesity, right transmetatarsal amputation, hypothyroidism.   Patient states she has nausea, vomiting and diarrhea for last 10 days.  She saw GI Dr. Collene Mares a week ago.  Patient reports stool studies showed enteropathogenic E. coli.  Antibiotics were not thought to be helpful and were not started. Patient also reports that because of poor oral intake she stopped taking her insulin pump few days prior to presentation. She got progressive generalized weakness.  She presented to the ED yesterday on 9/13 but had to wait several hours and hence left without being seen.  Her blood sugar this morning at home was significantly elevated and hence EMS was called. Patient gave herself 10 units of fast acting insulin. Patient was recently diagnosed with E. coli UTI.  She apparently came to the ED yesterday but left after 5 hours of waiting.   In the ED, patient's blood sugar level was more than 600. Labs with sodium 133, potassium 5, BUN/creatinine 41/2.35.  CBC normal. Blood gas with pH of 7.21, PCO2 33, HCO3 10.8. Patient was started on IV insulin drip, IV fluid. Hospital service was consulted for management of DKA.  Subjective: Patient was seen and examined this morning. Lying down in bed.  Not in distress.  Had 3 episodes of loose bowel movement yesterday.  None overnight. Blood sugar over 400 this morning.  Assessment/Plan: Diabetic ketoacidosis -Secondary to not using insulin because of poor oral intake and diarrhea.   -pH 7.1, blood sugar level elevated to more than 700. -Management done per DKA protocol with IV insulin drip, IV fluid, electrolyte  correction -9/15, taken off insulin drip and switched to subcutaneous insulin.  Poorly controlled diabetes mellitus type 2 - A1c 11.8 on 7/28 -Home meds include Insulin pump, Premeal insulin. -9/15, switched from insulin drip to subcutaneous insulin.  -Blood sugar level this morning over 400.  Given Lantus 30 units this morning.   -Discussed with diabetes care coordinator who reached out to patient's endocrinologist Dr. [name]. -Per recommendation, will treat the patient in the hospital with basal bolus insulin regimen.  Lantus 25 units twice daily, NovoLog 10 units 3 times daily with meals along with sliding scale insulin. -Post discharge, patient can resume insulin pump at home. -Discussed with diabetes care coordinator.   -Continue Accu-Cheks. -Continue Lyrica 150 mg twice daily for neuropathy. Recent Labs  Lab 02/08/20 1645 02/08/20 1910 02/08/20 2114 02/09/20 0854 02/09/20 1217  GLUCAP 347* 308* 285* 446* 388*   Enteropathogenic E. coli diarrhea -Ongoing for last more than a week.  Not septic. -Currently not on antibiotics.  -Because of the severity and duration of diarrhea, I will start the patient on azithromycin for next 3 days.  -Currently on scheduled Imodium. -IV fluid resuscitation to continue  AKI on CKD stage 4 -Baseline creatinine close to 2.   -In last 24 hours, creatinine has increased to 2.57.  Increase IV fluid rate to 125 mill per hour.   -Continue to hold diuretics. -Creatinine trend as below. Recent Labs    09/26/19 1428 12/21/19 1550 01/31/20 1536 02/06/20 1644 02/07/20 1129 02/07/20 1655 02/07/20 2116 02/08/20 0101 02/08/20 0536 02/09/20 1093  BUN 56* 44* 55* 30* 41* 38* 38* 38* 38* 41*  CREATININE 1.91* 2.18* 2.62* 1.98* 2.35* 2.05* 1.96* 1.89* 1.95* 2.57*   Hypokalemia/hypomagnesemia/hypophosphatemia -Electrolyte levels low with persistent diarrhea, poor oral intake. -Being monitored and aggressively replaced in the hospital.  Levels are  normal this morning.  Repeat labs tomorrow. Recent Labs  Lab 02/07/20 1655 02/07/20 2116 02/08/20 0101 02/08/20 0536 02/09/20 0633  K 4.0 3.6 3.1* 3.0* 3.9  MG  --   --   --  1.7 2.1  PHOS  --   --   --  1.4* 3.2   Chronic diastolic CHF/nonischemic cardiomyopathy Essential hypertension -Last echo from 2019 with EF 55 to 37%, grade 1 diastolic dysfunction -Home meds include Coreg 12.5 mg twice daily, torsemide 80 mg twice daily, metolazone 2.5 mg once a week, potassium and magnesium supplement -Currently she is only on Coreg only.  Diuretics on hold because of AKI.  Continue to monitor blood pressure.   Coronary artery disease  -status post stents in 2007 -Low risk nuclear study in 2018 -Currently with no cardiac symptoms.  -Continue Coreg and aspirin 81 mg daily, Plavix 75 mg daily.  Morbid obesity - Body mass index is 44.93 kg/m. Patient has been advised to make an attempt to improve diet and exercise patterns to aid in weight loss.  Anxiety/depression -Continue Lexapro 20 mg daily, trazodone 150 mg daily at bedtime,  History of GI bleeding -Continue Protonix 40 mg daily -Hemoglobin stable.  Hypothyroidism -Continue Synthroid 50 mcg daily,  Mobility: Encourage ambulation.  Home with home health PT recommended Code Status:   Code Status: Full Code full code DVT prophylaxis:  Lovenox subcu Antimicrobials:  None Fluid: Normal saline at 125 mL/h  Diet:  Diet Order            Diet heart healthy/carb modified Room service appropriate? Yes; Fluid consistency: Thin  Diet effective now                Consultants: None Family Communication:  Called and updated patient's husband on the phone on 9/15  Status is: Inpatient  Remains inpatient appropriate because of persistent diarrhea, AKI.   Dispo: The patient is from: Home              Anticipated d/c is to: Home with home health PT              Anticipated d/c date is: 2 days              Patient currently is not  medically stable to d/c.       Infusions:  . sodium chloride 125 mL/hr at 02/09/20 0901  . insulin Stopped (02/08/20 1257)  . lactated ringers Stopped (02/07/20 2351)    Scheduled Meds: . allopurinol  100 mg Oral BID  . vitamin C  1,000 mg Oral Daily  . aspirin EC  81 mg Oral q morning - 10a  . azithromycin  500 mg Oral Daily  . carvedilol  12.5 mg Oral BID WC  . cholecalciferol  1,000 Units Oral Q supper  . clopidogrel  75 mg Oral Daily  . enoxaparin (LOVENOX) injection  40 mg Subcutaneous Q12H  . ferrous sulfate  325 mg Oral BID WC  . insulin aspart  0-5 Units Subcutaneous QHS  . insulin aspart  0-9 Units Subcutaneous TID WC  . insulin aspart  10 Units Subcutaneous TID WC  . insulin glargine  25 Units Subcutaneous BID  . levothyroxine  50 mcg  Oral QAC breakfast  . loperamide  2 mg Oral Q8H  . pantoprazole  40 mg Oral Daily  . pregabalin  150 mg Oral BID  . sodium chloride flush  3 mL Intravenous Q12H    Antimicrobials: Anti-infectives (From admission, onward)   Start     Dose/Rate Route Frequency Ordered Stop   02/09/20 1315  azithromycin (ZITHROMAX) tablet 500 mg        500 mg Oral Daily 02/09/20 1301        PRN meds: dextrose, hydrALAZINE, nitroGLYCERIN, prochlorperazine   Objective: Vitals:   02/09/20 0021 02/09/20 0625  BP: 134/60 (!) 160/77  Pulse: 81 88  Resp: 20 17  Temp: 98.5 F (36.9 C) 98.3 F (36.8 C)  SpO2: 100% 99%    Intake/Output Summary (Last 24 hours) at 02/09/2020 1301 Last data filed at 02/09/2020 0900 Gross per 24 hour  Intake 2201.86 ml  Output 250 ml  Net 1951.86 ml   Filed Weights   02/07/20 1116 02/08/20 2110  Weight: 122.5 kg 123.7 kg   Weight change: 1.229 kg Body mass index is 45.38 kg/m.   Physical Exam: General exam: Appears calm and comfortable.  Not in physical distress Skin: No rashes, lesions or ulcers. HEENT: Atraumatic, normocephalic, supple neck, no obvious bleeding Lungs: Clear to auscultation bilaterally  CVS: Regular rate and rhythm, no murmur GI/Abd soft, nontender, nondistended, bowel sound present CNS: Alert, awake, oriented x3 Psychiatry: Mood appropriate Extremities: No pedal edema, no calf tenderness  Data Review: I have personally reviewed the laboratory data and studies available.  Recent Labs  Lab 02/06/20 1644 02/07/20 1129 02/08/20 0536 02/09/20 0633  WBC 8.7 8.9 7.2 5.4  NEUTROABS  --  8.4* 5.6 4.0  HGB 12.9 12.9 10.5* 11.0*  HCT 40.6 43.9 33.4* 36.3  MCV 83.9 91.1 86.1 88.3  PLT 285 257 210 159   Recent Labs  Lab 02/07/20 1655 02/07/20 2116 02/08/20 0101 02/08/20 0536 02/09/20 0633  NA 135 134* 133* 135 131*  K 4.0 3.6 3.1* 3.0* 3.9  CL 101 99 102 103 102  CO2 12* 18* 22 24 20*  GLUCOSE 497* 356* 227* 173* 433*  BUN 38* 38* 38* 38* 41*  CREATININE 2.05* 1.96* 1.89* 1.95* 2.57*  CALCIUM 8.4* 8.4* 8.1* 8.2* 8.1*  MG  --   --   --  1.7 2.1  PHOS  --   --   --  1.4* 3.2    F/u labs ordered for tomorrow  Signed, Terrilee Croak, MD [facility] 02/09/2020

## 2020-02-09 NOTE — TOC Transition Note (Signed)
Transition of Care [facility]) - CM/SW Discharge Note   Patient Details  Name: Madylyn Insco MRN: [id number] Date of Birth: Jan 11, 1951  Transition of Care [facility] CM/SW Contact:  Natasha Bence, LCSW Phone Number: 02/09/2020, 12:32 PM   Clinical Narrative:    CSW spoke with Advanced rep Vaughan Basta in order to resume patient's [facility] services. Vaughan Basta reported that she had been discharged from [facility], but was agreeable to provide services again. CSW placed referral with Vaughan Basta. CSW received PCP consult. CSW contacted patient's PCP and scheduled an appointment for Wednesday 02/15/2020 at 9:30. CSW notified patient's brother of appointment and provided it in the AVS. TOC signing off.    Final next level of care: [place] Barriers to Discharge: Barriers Resolved   Patient Goals and CMS Choice Patient states their goals for this hospitalization and ongoing recovery are:: Return home with [facility]   Choice offered to / list presented to : Patient  Discharge Placement                  Name of family member notified: Donivan Scull Patient and family notified of of transfer: 02/09/20  Discharge Plan and Services In-house Referral: Clinical Social Work                        HH Arranged: PT, RN [place] Agency: [place] ([place]) Date [place]: 02/09/20 Time [place]: 1232 Representative spoke with at [place]: [place] ([place]) Interventions     Readmission Risk Interventions Readmission Risk Prevention Plan 02/09/2020 02/08/2020  Transportation Screening Complete Complete  Medication Review Press photographer) Complete Complete  PCP or Specialist appointment within 3-5 days of discharge Complete -  [place] or [place] Complete Complete  SW Recovery Care/Counseling Consult Complete Complete  Palliative Care Screening Not Applicable Not [place] Not Applicable Not Applicable  Some recent data  might be hidden

## 2020-02-09 NOTE — Progress Notes (Signed)
CRITICAL VALUE ALERT  Critical Value:  CBG 446  Date & Time Notied:  02/09/20 0855  Provider Notified: Dr. Pietro Cassis  Orders Received/Actions taken: Orders received for novolog 15 units SQ x 1. Provider already increased Lantus to 30 units SQ. Discussed with patient. Novolog given as ordered. Lantus given once delivered from pharamcy.

## 2020-02-09 NOTE — Plan of Care (Addendum)
  Problem: Acute Rehab PT Goals(only PT should resolve) Goal: Pt Will Go Supine/Side To Sit Outcome: Progressing Flowsheets (Taken 02/09/2020 1120) Pt will go Supine/Side to Sit: with modified independence Goal: Patient Will Transfer Sit To/From Stand Outcome: Progressing Flowsheets (Taken 02/09/2020 1120) Patient will transfer sit to/from stand: with min guard assist Goal: Pt Will Transfer Bed To Chair/Chair To Bed Outcome: Progressing Flowsheets (Taken 02/09/2020 1120) Pt will Transfer Bed to Chair/Chair to Bed: min guard assist Goal: Pt Will Ambulate Outcome: Progressing Flowsheets (Taken 02/09/2020 1120) Pt will Ambulate: . 100 feet . with supervision . with rolling walker   11:20 AM , 02/09/20 Karlyn Agee, SPT Physical Therapy with [facility] [phone] office  During this treatment session, the therapist was present, participating in and directing the treatment.  12:03 PM, 02/09/20 Lonell Grandchild, MPT Physical Therapist with [facility] 336 [phone] office [phone] mobile phone

## 2020-02-10 DIAGNOSIS — A040 Enteropathogenic Escherichia coli infection: Secondary | ICD-10-CM | POA: Diagnosis present

## 2020-02-10 LAB — CBC WITH DIFFERENTIAL/PLATELET
Abs Immature Granulocytes: 0.02 10*3/uL (ref 0.00–0.07)
Basophils Absolute: 0 10*3/uL (ref 0.0–0.1)
Basophils Relative: 0 %
Eosinophils Absolute: 0.3 10*3/uL (ref 0.0–0.5)
Eosinophils Relative: 7 %
HCT: 34.5 % — ABNORMAL LOW (ref 36.0–46.0)
Hemoglobin: 10.4 g/dL — ABNORMAL LOW (ref 12.0–15.0)
Immature Granulocytes: 0 %
Lymphocytes Relative: 13 %
Lymphs Abs: 0.6 10*3/uL — ABNORMAL LOW (ref 0.7–4.0)
MCH: 26.8 pg (ref 26.0–34.0)
MCHC: 30.1 g/dL (ref 30.0–36.0)
MCV: 88.9 fL (ref 80.0–100.0)
Monocytes Absolute: 0.5 10*3/uL (ref 0.1–1.0)
Monocytes Relative: 11 %
Neutro Abs: 3.2 10*3/uL (ref 1.7–7.7)
Neutrophils Relative %: 69 %
Platelets: 154 10*3/uL (ref 150–400)
RBC: 3.88 MIL/uL (ref 3.87–5.11)
RDW: 15.7 % — ABNORMAL HIGH (ref 11.5–15.5)
WBC: 4.6 10*3/uL (ref 4.0–10.5)
nRBC: 0 % (ref 0.0–0.2)

## 2020-02-10 LAB — GLUCOSE, CAPILLARY
Glucose-Capillary: 216 mg/dL — ABNORMAL HIGH (ref 70–99)
Glucose-Capillary: 192 mg/dL — ABNORMAL HIGH (ref 70–99)
Glucose-Capillary: 205 mg/dL — ABNORMAL HIGH (ref 70–99)

## 2020-02-10 LAB — MAGNESIUM: Magnesium: 2 mg/dL (ref 1.7–2.4)

## 2020-02-10 LAB — BASIC METABOLIC PANEL
Anion gap: 5 (ref 5–15)
BUN: 41 mg/dL — ABNORMAL HIGH (ref 8–23)
CO2: 22 mmol/L (ref 22–32)
Calcium: 7.8 mg/dL — ABNORMAL LOW (ref 8.9–10.3)
Chloride: 108 mmol/L (ref 98–111)
Creatinine, Ser: 1.87 mg/dL — ABNORMAL HIGH (ref 0.44–1.00)
GFR calc Af Amer: 31 mL/min — ABNORMAL LOW (ref >60)
GFR calc non Af Amer: 27 mL/min — ABNORMAL LOW (ref >60)
Glucose, Bld: 213 mg/dL — ABNORMAL HIGH (ref 70–99)
Potassium: 3.6 mmol/L (ref 3.5–5.1)
Sodium: 135 mmol/L (ref 135–145)

## 2020-02-10 LAB — PHOSPHORUS: Phosphorus: 2.8 mg/dL (ref 2.5–4.6)

## 2020-02-10 MED ORDER — AZITHROMYCIN 500 MG PO TABS: 500.0000 mg | ORAL_TABLET | Freq: Every day | ORAL | 0 refills | Status: AC

## 2020-02-10 MED ORDER — LOPERAMIDE HCL 2 MG PO CAPS: 2.0000 mg | ORAL_CAPSULE | Freq: Three times a day (TID) | ORAL | 0 refills | Status: DC

## 2020-02-10 MED ORDER — TORSEMIDE 20 MG PO TABS: 40.0000 mg | ORAL_TABLET | Freq: Two times a day (BID) | ORAL | Status: DC

## 2020-02-10 NOTE — Discharge Summary (Signed)
Physician Discharge Summary  Susan [name] MWN:[id number] DOB: Aug 13, 1950 DOA: 02/07/2020  PCP: Alycia Rossetti, MD  Admit date: 02/07/2020 Discharge date: 02/10/2020  Admitted From: Home Discharge disposition: Home with home health, PT, RN, O2   Code Status: Full Code  Diet Recommendation: Cardiac/diabetic diet  Discharge Diagnosis:   Principal Problem:   DKA (diabetic ketoacidoses) ([place]) Active Problems:   Enteritis, enteropathogenic E. coli   History of Present Illness / Brief narrative:  Susan [name] a 69 y.o.femalewith PMH significant for DM 2, HTN, HLD, CAD, MI status post stents, chronic diastolic CHF TIA, CKD 4, morbid obesity, right transmetatarsal amputation,hypothyroidism.  Patient states she has nausea, vomiting and diarrhea for last 10 days. She saw GI Dr. Collene Mares a week ago. Patient reports stool studies showed enteropathogenic E. coli. Antibiotics were not thought to be helpful and were not started. Patient also reports that because of poor oral intake she stopped taking her insulin pump few days prior to presentation.She got progressive generalized weakness.  She presented to the ED yesterday on 9/13 but had to wait several hours and hence left without being seen. Her blood sugar this morning at home was significantly elevated and hence EMS was called. Patient gave herself 10 units of fast acting insulin. Patient was recently diagnosed with E. coli UTI.She apparently came to the ED yesterday but left after 5 hours of waiting.  In the ED, patient's blood sugar level was more than 600. Labs with sodium 133, potassium 5, BUN/creatinine 41/2.35. CBC normal. Blood gas with pH of 7.21, PCO2 33, HCO3 10.8. Patient was started on IV insulin drip, IV fluid. Hospital service was consulted for management of DKA.   Subjective:  Seen and examined this morning.  Lying on bed.  Not in distress.  On low-flow supplemental oxygen.  Diarrhea improved.  Hospital  Course:  Diabetic ketoacidosis -Secondary to not using insulin because of poor oral intake and diarrhea.   -pH 7.1, blood sugar level elevated to more than 700. -Management done per DKA protocol with IV insulin drip, IV fluid, electrolyte correction -9/15, taken off insulin drip and switched to subcutaneous insulin.  Poorly controlled diabetes mellitus type 2 - A1c 11.8 on 7/28 -Home meds include Insulin pump, Premeal insulin. -9/15, switched from insulin drip to subcutaneous insulin.  -Blood sugar level currently coming under control on Lantus twice daily and sliding scale insulin. -Patient however uses insulin pump under the care of endocrinologist Dr. [name]. -Diabetes care coordinator discussed with endocrinologist yesterday. -Patient will resume her insulin pump at home post discharge. -Continue Lyrica 150 mg twice daily for neuropathy. Recent Labs  Lab 02/09/20 1217 02/09/20 1609 02/09/20 2041 02/10/20 0733 02/10/20 1137  GLUCAP 388* 337* 198* 216* 192*   Enteropathogenic E. coli diarrhea -Ongoing for more than a week prior to presentation.  Not septic. -However she continued to have diarrhea even after hospitalization. -Because of the severity and duration of diarrhea, I start the patient on azithromycin after which diarrhea has slowed down.  Azithromycin 5 mg daily for 2 more days at home.  Imodium as needed.  AKI on CKD stage 4 -Baseline creatinine close to 2.   -Creatinine trend as below with a peak at 2.57.  With adequate hydration, creatinine level improving, down to 1.87 today. -Prior to admission, patient was on torsemide 80 mg twice daily. -I instructed her to resume torsemide at 40 mg twice daily for now and follow-up with her nephrologist Dr. Hollie Salk as an outpatient. Recent Labs  Physician Discharge Summary  Susan [name] MWN:[id number] DOB: Aug 13, 1950 DOA: 02/07/2020  PCP: Alycia Rossetti, MD  Admit date: 02/07/2020 Discharge date: 02/10/2020  Admitted From: Home Discharge disposition: Home with home health, PT, RN, O2   Code Status: Full Code  Diet Recommendation: Cardiac/diabetic diet  Discharge Diagnosis:   Principal Problem:   DKA (diabetic ketoacidoses) ([place]) Active Problems:   Enteritis, enteropathogenic E. coli   History of Present Illness / Brief narrative:  Susan [name] a 69 y.o.femalewith PMH significant for DM 2, HTN, HLD, CAD, MI status post stents, chronic diastolic CHF TIA, CKD 4, morbid obesity, right transmetatarsal amputation,hypothyroidism.  Patient states she has nausea, vomiting and diarrhea for last 10 days. She saw GI Dr. Collene Mares a week ago. Patient reports stool studies showed enteropathogenic E. coli. Antibiotics were not thought to be helpful and were not started. Patient also reports that because of poor oral intake she stopped taking her insulin pump few days prior to presentation.She got progressive generalized weakness.  She presented to the ED yesterday on 9/13 but had to wait several hours and hence left without being seen. Her blood sugar this morning at home was significantly elevated and hence EMS was called. Patient gave herself 10 units of fast acting insulin. Patient was recently diagnosed with E. coli UTI.She apparently came to the ED yesterday but left after 5 hours of waiting.  In the ED, patient's blood sugar level was more than 600. Labs with sodium 133, potassium 5, BUN/creatinine 41/2.35. CBC normal. Blood gas with pH of 7.21, PCO2 33, HCO3 10.8. Patient was started on IV insulin drip, IV fluid. Hospital service was consulted for management of DKA.   Subjective:  Seen and examined this morning.  Lying on bed.  Not in distress.  On low-flow supplemental oxygen.  Diarrhea improved.  Hospital  Course:  Diabetic ketoacidosis -Secondary to not using insulin because of poor oral intake and diarrhea.   -pH 7.1, blood sugar level elevated to more than 700. -Management done per DKA protocol with IV insulin drip, IV fluid, electrolyte correction -9/15, taken off insulin drip and switched to subcutaneous insulin.  Poorly controlled diabetes mellitus type 2 - A1c 11.8 on 7/28 -Home meds include Insulin pump, Premeal insulin. -9/15, switched from insulin drip to subcutaneous insulin.  -Blood sugar level currently coming under control on Lantus twice daily and sliding scale insulin. -Patient however uses insulin pump under the care of endocrinologist Dr. [name]. -Diabetes care coordinator discussed with endocrinologist yesterday. -Patient will resume her insulin pump at home post discharge. -Continue Lyrica 150 mg twice daily for neuropathy. Recent Labs  Lab 02/09/20 1217 02/09/20 1609 02/09/20 2041 02/10/20 0733 02/10/20 1137  GLUCAP 388* 337* 198* 216* 192*   Enteropathogenic E. coli diarrhea -Ongoing for more than a week prior to presentation.  Not septic. -However she continued to have diarrhea even after hospitalization. -Because of the severity and duration of diarrhea, I start the patient on azithromycin after which diarrhea has slowed down.  Azithromycin 5 mg daily for 2 more days at home.  Imodium as needed.  AKI on CKD stage 4 -Baseline creatinine close to 2.   -Creatinine trend as below with a peak at 2.57.  With adequate hydration, creatinine level improving, down to 1.87 today. -Prior to admission, patient was on torsemide 80 mg twice daily. -I instructed her to resume torsemide at 40 mg twice daily for now and follow-up with her nephrologist Dr. Hollie Salk as an outpatient. Recent Labs  [id number]   Weight:       270.0 lb Date of Birth:  10-04-1950    BSA:          2.247 m Patient Age:    5 years     BP:           109/52 mmHg Patient Gender: F            HR:           73 bpm. Exam Location:  [facility] Procedure: 2D Echo Indications:    CHF-Acute Diastolic 720.94 / B09.62  History:        Patient has prior history of Echocardiogram examinations, most                 recent 03/19/2018. CAD; Risk Factors:Former Smoker, Dyslipidemia                 and Hypertension. RBBB.  Sonographer:    Leavy Cella RDCS (AE) Referring Phys: [id number] [place]  1. Left ventricular ejection fraction, by estimation, is 55 to 60%. The left ventricle has normal function. The left ventricle has no regional wall motion abnormalities. There is moderate left ventricular hypertrophy. Left ventricular diastolic parameters are indeterminate.  2. Right ventricular systolic function is normal. The right ventricular size is normal.  3. The mitral valve is normal in structure. No evidence of mitral valve regurgitation. No evidence of mitral stenosis.  4. The aortic valve is tricuspid. Aortic valve regurgitation is not visualized. No aortic stenosis is present.  5. The inferior vena cava is normal in size with greater than 50% respiratory variability, suggesting right atrial pressure of 3 mmHg. FINDINGS  Left Ventricle: Left ventricular ejection fraction, by estimation, is 55 to 60%. The left ventricle has normal function. The left ventricle has no regional wall motion abnormalities. The left ventricular internal cavity size was normal in size. There is  moderate left ventricular hypertrophy. Left ventricular diastolic parameters are indeterminate. Right Ventricle: The right ventricular size is normal. No increase in right ventricular wall thickness. Right ventricular systolic function is normal. Left Atrium: Left atrial size was normal in size. Right Atrium: Right atrial size was normal in size. Pericardium: There is no evidence of pericardial effusion. Mitral Valve: The mitral valve is normal in structure. No evidence of mitral valve regurgitation. No evidence of mitral valve stenosis. Tricuspid Valve: The tricuspid valve is normal in structure. Tricuspid valve regurgitation is not demonstrated. No evidence of tricuspid stenosis. Aortic Valve: The aortic valve is tricuspid. Aortic valve regurgitation is not visualized. No aortic stenosis is present. Aortic valve mean gradient measures 5.3 mmHg. Aortic valve peak  gradient measures 9.9 mmHg. Aortic valve area, by VTI measures 1.44 cm. Pulmonic Valve: The pulmonic valve was not well visualized. Pulmonic valve regurgitation is not visualized. No evidence of pulmonic stenosis. Aorta: The aortic root is normal in size and structure. Pulmonary Artery: Indeterminate PASP, inadequate TR jet. Venous: The inferior vena cava is normal in size with greater than 50% respiratory variability, suggesting right atrial pressure of 3 mmHg. IAS/Shunts: No atrial level shunt detected by color flow Doppler.  LEFT VENTRICLE PLAX 2D LVIDd:         5.04 cm  Diastology LVIDs:         3.55 cm  LV e' medial:    5.87 cm/s LV PW:         1.30 cm  LV E/e' medial:  17.9 LV IVS:  [id number]   Weight:       270.0 lb Date of Birth:  10-04-1950    BSA:          2.247 m Patient Age:    5 years     BP:           109/52 mmHg Patient Gender: F            HR:           73 bpm. Exam Location:  [facility] Procedure: 2D Echo Indications:    CHF-Acute Diastolic 720.94 / B09.62  History:        Patient has prior history of Echocardiogram examinations, most                 recent 03/19/2018. CAD; Risk Factors:Former Smoker, Dyslipidemia                 and Hypertension. RBBB.  Sonographer:    Leavy Cella RDCS (AE) Referring Phys: [id number] [place]  1. Left ventricular ejection fraction, by estimation, is 55 to 60%. The left ventricle has normal function. The left ventricle has no regional wall motion abnormalities. There is moderate left ventricular hypertrophy. Left ventricular diastolic parameters are indeterminate.  2. Right ventricular systolic function is normal. The right ventricular size is normal.  3. The mitral valve is normal in structure. No evidence of mitral valve regurgitation. No evidence of mitral stenosis.  4. The aortic valve is tricuspid. Aortic valve regurgitation is not visualized. No aortic stenosis is present.  5. The inferior vena cava is normal in size with greater than 50% respiratory variability, suggesting right atrial pressure of 3 mmHg. FINDINGS  Left Ventricle: Left ventricular ejection fraction, by estimation, is 55 to 60%. The left ventricle has normal function. The left ventricle has no regional wall motion abnormalities. The left ventricular internal cavity size was normal in size. There is  moderate left ventricular hypertrophy. Left ventricular diastolic parameters are indeterminate. Right Ventricle: The right ventricular size is normal. No increase in right ventricular wall thickness. Right ventricular systolic function is normal. Left Atrium: Left atrial size was normal in size. Right Atrium: Right atrial size was normal in size. Pericardium: There is no evidence of pericardial effusion. Mitral Valve: The mitral valve is normal in structure. No evidence of mitral valve regurgitation. No evidence of mitral valve stenosis. Tricuspid Valve: The tricuspid valve is normal in structure. Tricuspid valve regurgitation is not demonstrated. No evidence of tricuspid stenosis. Aortic Valve: The aortic valve is tricuspid. Aortic valve regurgitation is not visualized. No aortic stenosis is present. Aortic valve mean gradient measures 5.3 mmHg. Aortic valve peak  gradient measures 9.9 mmHg. Aortic valve area, by VTI measures 1.44 cm. Pulmonic Valve: The pulmonic valve was not well visualized. Pulmonic valve regurgitation is not visualized. No evidence of pulmonic stenosis. Aorta: The aortic root is normal in size and structure. Pulmonary Artery: Indeterminate PASP, inadequate TR jet. Venous: The inferior vena cava is normal in size with greater than 50% respiratory variability, suggesting right atrial pressure of 3 mmHg. IAS/Shunts: No atrial level shunt detected by color flow Doppler.  LEFT VENTRICLE PLAX 2D LVIDd:         5.04 cm  Diastology LVIDs:         3.55 cm  LV e' medial:    5.87 cm/s LV PW:         1.30 cm  LV E/e' medial:  17.9 LV IVS:  [id number]   Weight:       270.0 lb Date of Birth:  10-04-1950    BSA:          2.247 m Patient Age:    5 years     BP:           109/52 mmHg Patient Gender: F            HR:           73 bpm. Exam Location:  [facility] Procedure: 2D Echo Indications:    CHF-Acute Diastolic 720.94 / B09.62  History:        Patient has prior history of Echocardiogram examinations, most                 recent 03/19/2018. CAD; Risk Factors:Former Smoker, Dyslipidemia                 and Hypertension. RBBB.  Sonographer:    Leavy Cella RDCS (AE) Referring Phys: [id number] [place]  1. Left ventricular ejection fraction, by estimation, is 55 to 60%. The left ventricle has normal function. The left ventricle has no regional wall motion abnormalities. There is moderate left ventricular hypertrophy. Left ventricular diastolic parameters are indeterminate.  2. Right ventricular systolic function is normal. The right ventricular size is normal.  3. The mitral valve is normal in structure. No evidence of mitral valve regurgitation. No evidence of mitral stenosis.  4. The aortic valve is tricuspid. Aortic valve regurgitation is not visualized. No aortic stenosis is present.  5. The inferior vena cava is normal in size with greater than 50% respiratory variability, suggesting right atrial pressure of 3 mmHg. FINDINGS  Left Ventricle: Left ventricular ejection fraction, by estimation, is 55 to 60%. The left ventricle has normal function. The left ventricle has no regional wall motion abnormalities. The left ventricular internal cavity size was normal in size. There is  moderate left ventricular hypertrophy. Left ventricular diastolic parameters are indeterminate. Right Ventricle: The right ventricular size is normal. No increase in right ventricular wall thickness. Right ventricular systolic function is normal. Left Atrium: Left atrial size was normal in size. Right Atrium: Right atrial size was normal in size. Pericardium: There is no evidence of pericardial effusion. Mitral Valve: The mitral valve is normal in structure. No evidence of mitral valve regurgitation. No evidence of mitral valve stenosis. Tricuspid Valve: The tricuspid valve is normal in structure. Tricuspid valve regurgitation is not demonstrated. No evidence of tricuspid stenosis. Aortic Valve: The aortic valve is tricuspid. Aortic valve regurgitation is not visualized. No aortic stenosis is present. Aortic valve mean gradient measures 5.3 mmHg. Aortic valve peak  gradient measures 9.9 mmHg. Aortic valve area, by VTI measures 1.44 cm. Pulmonic Valve: The pulmonic valve was not well visualized. Pulmonic valve regurgitation is not visualized. No evidence of pulmonic stenosis. Aorta: The aortic root is normal in size and structure. Pulmonary Artery: Indeterminate PASP, inadequate TR jet. Venous: The inferior vena cava is normal in size with greater than 50% respiratory variability, suggesting right atrial pressure of 3 mmHg. IAS/Shunts: No atrial level shunt detected by color flow Doppler.  LEFT VENTRICLE PLAX 2D LVIDd:         5.04 cm  Diastology LVIDs:         3.55 cm  LV e' medial:    5.87 cm/s LV PW:         1.30 cm  LV E/e' medial:  17.9 LV IVS:  [id number]   Weight:       270.0 lb Date of Birth:  10-04-1950    BSA:          2.247 m Patient Age:    5 years     BP:           109/52 mmHg Patient Gender: F            HR:           73 bpm. Exam Location:  [facility] Procedure: 2D Echo Indications:    CHF-Acute Diastolic 720.94 / B09.62  History:        Patient has prior history of Echocardiogram examinations, most                 recent 03/19/2018. CAD; Risk Factors:Former Smoker, Dyslipidemia                 and Hypertension. RBBB.  Sonographer:    Leavy Cella RDCS (AE) Referring Phys: [id number] [place]  1. Left ventricular ejection fraction, by estimation, is 55 to 60%. The left ventricle has normal function. The left ventricle has no regional wall motion abnormalities. There is moderate left ventricular hypertrophy. Left ventricular diastolic parameters are indeterminate.  2. Right ventricular systolic function is normal. The right ventricular size is normal.  3. The mitral valve is normal in structure. No evidence of mitral valve regurgitation. No evidence of mitral stenosis.  4. The aortic valve is tricuspid. Aortic valve regurgitation is not visualized. No aortic stenosis is present.  5. The inferior vena cava is normal in size with greater than 50% respiratory variability, suggesting right atrial pressure of 3 mmHg. FINDINGS  Left Ventricle: Left ventricular ejection fraction, by estimation, is 55 to 60%. The left ventricle has normal function. The left ventricle has no regional wall motion abnormalities. The left ventricular internal cavity size was normal in size. There is  moderate left ventricular hypertrophy. Left ventricular diastolic parameters are indeterminate. Right Ventricle: The right ventricular size is normal. No increase in right ventricular wall thickness. Right ventricular systolic function is normal. Left Atrium: Left atrial size was normal in size. Right Atrium: Right atrial size was normal in size. Pericardium: There is no evidence of pericardial effusion. Mitral Valve: The mitral valve is normal in structure. No evidence of mitral valve regurgitation. No evidence of mitral valve stenosis. Tricuspid Valve: The tricuspid valve is normal in structure. Tricuspid valve regurgitation is not demonstrated. No evidence of tricuspid stenosis. Aortic Valve: The aortic valve is tricuspid. Aortic valve regurgitation is not visualized. No aortic stenosis is present. Aortic valve mean gradient measures 5.3 mmHg. Aortic valve peak  gradient measures 9.9 mmHg. Aortic valve area, by VTI measures 1.44 cm. Pulmonic Valve: The pulmonic valve was not well visualized. Pulmonic valve regurgitation is not visualized. No evidence of pulmonic stenosis. Aorta: The aortic root is normal in size and structure. Pulmonary Artery: Indeterminate PASP, inadequate TR jet. Venous: The inferior vena cava is normal in size with greater than 50% respiratory variability, suggesting right atrial pressure of 3 mmHg. IAS/Shunts: No atrial level shunt detected by color flow Doppler.  LEFT VENTRICLE PLAX 2D LVIDd:         5.04 cm  Diastology LVIDs:         3.55 cm  LV e' medial:    5.87 cm/s LV PW:         1.30 cm  LV E/e' medial:  17.9 LV IVS:  Physician Discharge Summary  Susan [name] MWN:[id number] DOB: Aug 13, 1950 DOA: 02/07/2020  PCP: Alycia Rossetti, MD  Admit date: 02/07/2020 Discharge date: 02/10/2020  Admitted From: Home Discharge disposition: Home with home health, PT, RN, O2   Code Status: Full Code  Diet Recommendation: Cardiac/diabetic diet  Discharge Diagnosis:   Principal Problem:   DKA (diabetic ketoacidoses) ([place]) Active Problems:   Enteritis, enteropathogenic E. coli   History of Present Illness / Brief narrative:  Susan [name] a 69 y.o.femalewith PMH significant for DM 2, HTN, HLD, CAD, MI status post stents, chronic diastolic CHF TIA, CKD 4, morbid obesity, right transmetatarsal amputation,hypothyroidism.  Patient states she has nausea, vomiting and diarrhea for last 10 days. She saw GI Dr. Collene Mares a week ago. Patient reports stool studies showed enteropathogenic E. coli. Antibiotics were not thought to be helpful and were not started. Patient also reports that because of poor oral intake she stopped taking her insulin pump few days prior to presentation.She got progressive generalized weakness.  She presented to the ED yesterday on 9/13 but had to wait several hours and hence left without being seen. Her blood sugar this morning at home was significantly elevated and hence EMS was called. Patient gave herself 10 units of fast acting insulin. Patient was recently diagnosed with E. coli UTI.She apparently came to the ED yesterday but left after 5 hours of waiting.  In the ED, patient's blood sugar level was more than 600. Labs with sodium 133, potassium 5, BUN/creatinine 41/2.35. CBC normal. Blood gas with pH of 7.21, PCO2 33, HCO3 10.8. Patient was started on IV insulin drip, IV fluid. Hospital service was consulted for management of DKA.   Subjective:  Seen and examined this morning.  Lying on bed.  Not in distress.  On low-flow supplemental oxygen.  Diarrhea improved.  Hospital  Course:  Diabetic ketoacidosis -Secondary to not using insulin because of poor oral intake and diarrhea.   -pH 7.1, blood sugar level elevated to more than 700. -Management done per DKA protocol with IV insulin drip, IV fluid, electrolyte correction -9/15, taken off insulin drip and switched to subcutaneous insulin.  Poorly controlled diabetes mellitus type 2 - A1c 11.8 on 7/28 -Home meds include Insulin pump, Premeal insulin. -9/15, switched from insulin drip to subcutaneous insulin.  -Blood sugar level currently coming under control on Lantus twice daily and sliding scale insulin. -Patient however uses insulin pump under the care of endocrinologist Dr. [name]. -Diabetes care coordinator discussed with endocrinologist yesterday. -Patient will resume her insulin pump at home post discharge. -Continue Lyrica 150 mg twice daily for neuropathy. Recent Labs  Lab 02/09/20 1217 02/09/20 1609 02/09/20 2041 02/10/20 0733 02/10/20 1137  GLUCAP 388* 337* 198* 216* 192*   Enteropathogenic E. coli diarrhea -Ongoing for more than a week prior to presentation.  Not septic. -However she continued to have diarrhea even after hospitalization. -Because of the severity and duration of diarrhea, I start the patient on azithromycin after which diarrhea has slowed down.  Azithromycin 5 mg daily for 2 more days at home.  Imodium as needed.  AKI on CKD stage 4 -Baseline creatinine close to 2.   -Creatinine trend as below with a peak at 2.57.  With adequate hydration, creatinine level improving, down to 1.87 today. -Prior to admission, patient was on torsemide 80 mg twice daily. -I instructed her to resume torsemide at 40 mg twice daily for now and follow-up with her nephrologist Dr. Hollie Salk as an outpatient. Recent Labs  [id number]   Weight:       270.0 lb Date of Birth:  10-04-1950    BSA:          2.247 m Patient Age:    5 years     BP:           109/52 mmHg Patient Gender: F            HR:           73 bpm. Exam Location:  [facility] Procedure: 2D Echo Indications:    CHF-Acute Diastolic 720.94 / B09.62  History:        Patient has prior history of Echocardiogram examinations, most                 recent 03/19/2018. CAD; Risk Factors:Former Smoker, Dyslipidemia                 and Hypertension. RBBB.  Sonographer:    Leavy Cella RDCS (AE) Referring Phys: [id number] [place]  1. Left ventricular ejection fraction, by estimation, is 55 to 60%. The left ventricle has normal function. The left ventricle has no regional wall motion abnormalities. There is moderate left ventricular hypertrophy. Left ventricular diastolic parameters are indeterminate.  2. Right ventricular systolic function is normal. The right ventricular size is normal.  3. The mitral valve is normal in structure. No evidence of mitral valve regurgitation. No evidence of mitral stenosis.  4. The aortic valve is tricuspid. Aortic valve regurgitation is not visualized. No aortic stenosis is present.  5. The inferior vena cava is normal in size with greater than 50% respiratory variability, suggesting right atrial pressure of 3 mmHg. FINDINGS  Left Ventricle: Left ventricular ejection fraction, by estimation, is 55 to 60%. The left ventricle has normal function. The left ventricle has no regional wall motion abnormalities. The left ventricular internal cavity size was normal in size. There is  moderate left ventricular hypertrophy. Left ventricular diastolic parameters are indeterminate. Right Ventricle: The right ventricular size is normal. No increase in right ventricular wall thickness. Right ventricular systolic function is normal. Left Atrium: Left atrial size was normal in size. Right Atrium: Right atrial size was normal in size. Pericardium: There is no evidence of pericardial effusion. Mitral Valve: The mitral valve is normal in structure. No evidence of mitral valve regurgitation. No evidence of mitral valve stenosis. Tricuspid Valve: The tricuspid valve is normal in structure. Tricuspid valve regurgitation is not demonstrated. No evidence of tricuspid stenosis. Aortic Valve: The aortic valve is tricuspid. Aortic valve regurgitation is not visualized. No aortic stenosis is present. Aortic valve mean gradient measures 5.3 mmHg. Aortic valve peak  gradient measures 9.9 mmHg. Aortic valve area, by VTI measures 1.44 cm. Pulmonic Valve: The pulmonic valve was not well visualized. Pulmonic valve regurgitation is not visualized. No evidence of pulmonic stenosis. Aorta: The aortic root is normal in size and structure. Pulmonary Artery: Indeterminate PASP, inadequate TR jet. Venous: The inferior vena cava is normal in size with greater than 50% respiratory variability, suggesting right atrial pressure of 3 mmHg. IAS/Shunts: No atrial level shunt detected by color flow Doppler.  LEFT VENTRICLE PLAX 2D LVIDd:         5.04 cm  Diastology LVIDs:         3.55 cm  LV e' medial:    5.87 cm/s LV PW:         1.30 cm  LV E/e' medial:  17.9 LV IVS:  Physician Discharge Summary  Susan [name] MWN:[id number] DOB: Aug 13, 1950 DOA: 02/07/2020  PCP: Alycia Rossetti, MD  Admit date: 02/07/2020 Discharge date: 02/10/2020  Admitted From: Home Discharge disposition: Home with home health, PT, RN, O2   Code Status: Full Code  Diet Recommendation: Cardiac/diabetic diet  Discharge Diagnosis:   Principal Problem:   DKA (diabetic ketoacidoses) ([place]) Active Problems:   Enteritis, enteropathogenic E. coli   History of Present Illness / Brief narrative:  Susan [name] a 69 y.o.femalewith PMH significant for DM 2, HTN, HLD, CAD, MI status post stents, chronic diastolic CHF TIA, CKD 4, morbid obesity, right transmetatarsal amputation,hypothyroidism.  Patient states she has nausea, vomiting and diarrhea for last 10 days. She saw GI Dr. Collene Mares a week ago. Patient reports stool studies showed enteropathogenic E. coli. Antibiotics were not thought to be helpful and were not started. Patient also reports that because of poor oral intake she stopped taking her insulin pump few days prior to presentation.She got progressive generalized weakness.  She presented to the ED yesterday on 9/13 but had to wait several hours and hence left without being seen. Her blood sugar this morning at home was significantly elevated and hence EMS was called. Patient gave herself 10 units of fast acting insulin. Patient was recently diagnosed with E. coli UTI.She apparently came to the ED yesterday but left after 5 hours of waiting.  In the ED, patient's blood sugar level was more than 600. Labs with sodium 133, potassium 5, BUN/creatinine 41/2.35. CBC normal. Blood gas with pH of 7.21, PCO2 33, HCO3 10.8. Patient was started on IV insulin drip, IV fluid. Hospital service was consulted for management of DKA.   Subjective:  Seen and examined this morning.  Lying on bed.  Not in distress.  On low-flow supplemental oxygen.  Diarrhea improved.  Hospital  Course:  Diabetic ketoacidosis -Secondary to not using insulin because of poor oral intake and diarrhea.   -pH 7.1, blood sugar level elevated to more than 700. -Management done per DKA protocol with IV insulin drip, IV fluid, electrolyte correction -9/15, taken off insulin drip and switched to subcutaneous insulin.  Poorly controlled diabetes mellitus type 2 - A1c 11.8 on 7/28 -Home meds include Insulin pump, Premeal insulin. -9/15, switched from insulin drip to subcutaneous insulin.  -Blood sugar level currently coming under control on Lantus twice daily and sliding scale insulin. -Patient however uses insulin pump under the care of endocrinologist Dr. [name]. -Diabetes care coordinator discussed with endocrinologist yesterday. -Patient will resume her insulin pump at home post discharge. -Continue Lyrica 150 mg twice daily for neuropathy. Recent Labs  Lab 02/09/20 1217 02/09/20 1609 02/09/20 2041 02/10/20 0733 02/10/20 1137  GLUCAP 388* 337* 198* 216* 192*   Enteropathogenic E. coli diarrhea -Ongoing for more than a week prior to presentation.  Not septic. -However she continued to have diarrhea even after hospitalization. -Because of the severity and duration of diarrhea, I start the patient on azithromycin after which diarrhea has slowed down.  Azithromycin 5 mg daily for 2 more days at home.  Imodium as needed.  AKI on CKD stage 4 -Baseline creatinine close to 2.   -Creatinine trend as below with a peak at 2.57.  With adequate hydration, creatinine level improving, down to 1.87 today. -Prior to admission, patient was on torsemide 80 mg twice daily. -I instructed her to resume torsemide at 40 mg twice daily for now and follow-up with her nephrologist Dr. Hollie Salk as an outpatient. Recent Labs

## 2020-02-10 NOTE — TOC Transition Note (Addendum)
Transition of Care [facility]) - CM/SW Discharge Note   Patient Details  Name: Susan [name] MRN: [id number] Date of Birth: 14-Oct-1950  [facility] CM/SW Contact:  Iona Beard, [place] Phone Number: 02/10/2020, 2:37 PM   Clinical Narrative:    Donella Stade sent Jeneen Rinks with [facility] referral for home oxygen. Jeneen Rinks accepted and will have oxygen and concentrator delivered to the home. CSW updated pts husband Percell Miller of D/C. Pts husband will bring portable tank to hospital once delivered to home. HH with [facility] is set up, will contact pt within 48 hours of D/C. TOC signing off.      Final next level of care: [place] Barriers to Discharge: Barriers Resolved   Patient Goals and CMS Choice Patient states their goals for this hospitalization and ongoing recovery are:: Return home with [facility]   Choice offered to / list presented to : Patient  Discharge Placement                  Name of family member notified: Donivan Scull Patient and family notified of of transfer: 02/09/20  Discharge Plan and Services In-house Referral: Clinical Social Work              DME Arranged: Oxygen DME Agency: [place] Date DME Agency Contacted: 02/10/20 Time DME Agency Contacted: [phone] Representative spoke with at DME Agency: Jeneen Rinks HH Arranged: PT, RN [facility] Agency: [place] ([place]) Date [place]: 02/09/20 Time [place]: 1232 Representative spoke with at [place]: [place] ([place]) Interventions     Readmission Risk Interventions Readmission Risk Prevention Plan 02/09/2020 02/08/2020  Transportation Screening Complete Complete  Medication Review Press photographer) Complete Complete  PCP or Specialist appointment within 3-5 days of discharge Complete -  [place] or [place] Complete Complete  SW Recovery Care/Counseling Consult Complete Complete  Palliative Care Screening Not Applicable Not [place] Not Applicable Not Applicable  Some recent data might be hidden

## 2020-02-10 NOTE — Progress Notes (Signed)
Discharge instructions reviewed with patient and husband.  Both verbalized understanding of instructions. Patient discharged home with wife in stable condition.

## 2020-02-10 NOTE — Progress Notes (Signed)
SATURATION QUALIFICATIONS: (This note is used to comply with regulatory documentation for home oxygen)  Patient Saturations on Room Air at Rest = 96%  Patient Saturations on Room Air while Ambulating = 87%  Patient Saturations on 1.5 Liters of oxygen while Ambulating = 96%  Please briefly explain why patient needs home oxygen:

## 2020-02-10 NOTE — Progress Notes (Addendum)
Physical Therapy Treatment Patient Details Name: Susan [name] MRN: [id number] DOB: 03-19-1951 Today's Date: 02/10/2020    History of Present Illness History of Present Illness:Susan [name] is a 69 y.o. female with PMH significant for DM 2, HTN, HLD, CAD, MI status post stents, chronic diastolic CHF TIA, CKD 4, morbid obesity, right transmetatarsal amputation, hypothyroidism.  Patient states she has nausea, vomiting and diarrhea for last 10 days.  She saw GI Dr. Collene Mares a week ago.  Patient reports stool studies showed enteropathogenic E. coli.  Antibiotics were not thought to be helpful and were not started.Patient also reports that because of poor oral intake she stopped taking her insulin a week ago.  She got progressive generalized weakness. She presented to the ED yesterday on 9/13 but had to wait several hours and hence left without being seen.  Her blood sugar this morning at home was significantly elevated and hence EMS was called. Patient gave herself 10 units of fast acting insulin.Patient was recently diagnosed with E. coli UTI.  She apparently came to the ED yesterday but left after 5 hours of waiting. In the ED, patient's blood sugar level was more than 600.Labs with sodium 133, potassium 5, BUN/creatinine 41/2.35.  CBC normal.Blood gas with pH of 7.21, PCO2 33, HCO3 10.8.Patient was started on IV insulin drip, IV fluid.Hospital service was consulted for management of DKA.    PT Comments    The patient's O2 sat was 98% 1.5LPM, and remained at 96% after 2 minutes on RA, so Quentin removed for treatment session. She performed supine to sit with slow movement using the bed rails, but without LOB or SOB. She tolerated upright sitting with O2 stable. She performed sit to stand RW min guard and proceeded to ambulation 70 feet RW. Her O2 dropped to 87% RA during ambulation, but raised to 94% RA after 30 seconds of pursed lip breathing. She demonstrated no SOB even with O2 drop. She was able to perform seated  therapeutic exercises once back in the room with O2 stable at 96% on RA. Patient tolerated sitting up in chair after therapy- RN notified. PLAN: The patient will continue to benefit from skilled physical therapy services in hospital at recommended venue below in order to improve balance, gait, and ADL's to promote independence in functional activities.     Follow Up Recommendations  Home health PT;Supervision for mobility/OOB     Equipment Recommendations  None recommended by PT    Recommendations for Other Services       Precautions / Restrictions Precautions Precautions: Fall Restrictions Weight Bearing Restrictions: No    Mobility  Bed Mobility Overal bed mobility: Needs Assistance Bed Mobility: Supine to Sit     Supine to sit: Supervision;Modified independent (Device/Increase time)     General bed mobility comments: increased time required, used bed rails  Transfers Overall transfer level: Needs assistance Equipment used: Rolling walker (2 wheeled) Transfers: Sit to/from Stand Sit to Stand: Min guard         General transfer comment: O2 stable upon standing, tolerates standing for 3 minutes before ambulation  Ambulation/Gait Ambulation/Gait assistance: Supervision Gait Distance (Feet): 70 Feet Assistive device: Rolling walker (2 wheeled) Gait Pattern/deviations: Step-through pattern;Decreased stride length;WFL(Within Functional Limits);Trunk flexed Gait velocity: decreased   General Gait Details: no signs of SOB, desat to 87% RA, increase to 94% RA with pursed lip breathing exercise   Stairs             Wheelchair Mobility    Modified Rankin (  Stroke Patients Only)       Balance Overall balance assessment: Needs assistance Sitting-balance support: Feet supported;No upper extremity supported Sitting balance-Leahy Scale: Good Sitting balance - Comments: sitting EOB   Standing balance support: Bilateral upper extremity supported;During  functional activity Standing balance-Leahy Scale: Good Standing balance comment: BUE for weight acceptance w RW; pt able to communicate during ambulation without SOB                            Cognition Arousal/Alertness: Awake/alert Behavior During Therapy: WFL for tasks assessed/performed Overall Cognitive Status: Within Functional Limits for tasks assessed                                        Exercises General Exercises - Lower Extremity Long Arc Quad: AROM;Strengthening;Seated;Both;10 reps Hip Flexion/Marching: AROM;Strengthening;Seated;Both;10 reps Toe Raises: AROM;Strengthening;Seated;Both;10 reps Heel Raises: AROM;Strengthening;Seated;Both;10 reps    General Comments        Pertinent Vitals/Pain Pain Assessment: No/denies pain    Home Living                      Prior Function            PT Goals (current goals can now be found in the care plan section) Acute Rehab PT Goals Patient Stated Goal: go home PT Goal Formulation: With patient Time For Goal Achievement: 02/16/20 Potential to Achieve Goals: Good Progress towards PT goals: Progressing toward goals    Frequency    Min 3X/week      PT Plan Current plan remains appropriate    Co-evaluation              AM-PAC PT "6 Clicks" Mobility   Outcome Measure  Help needed turning from your back to your side while in a flat bed without using bedrails?: None Help needed moving from lying on your back to sitting on the side of a flat bed without using bedrails?: None Help needed moving to and from a bed to a chair (including a wheelchair)?: A Little Help needed standing up from a chair using your arms (e.g., wheelchair or bedside chair)?: None Help needed to walk in hospital room?: A Little Help needed climbing 3-5 steps with a railing? : A Lot 6 Click Score: 20    End of Session Equipment Utilized During Treatment: Gait belt Activity Tolerance: Patient  tolerated treatment well Patient left: in chair;with call bell/phone within reach Nurse Communication: Mobility status PT Visit Diagnosis: Unsteadiness on feet (R26.81);History of falling (Z91.81);Muscle weakness (generalized) (M62.81);Pain     Time: 4536-4680 PT Time Calculation (min) (ACUTE ONLY): 30 min  Charges:  $Gait Training: 8-22 mins $Therapeutic Exercise: 8-22 mins                     2:02 PM , 02/10/20 Karlyn Agee, SPT Physical Therapy with [facility] [phone] office  During this treatment session, the therapist was present, participating in and directing the treatment.  2:02 PM, 02/10/20 Lonell Grandchild, MPT Physical Therapist with [facility] [phone] office [phone] mobile phone

## 2020-02-10 NOTE — Progress Notes (Signed)
Inpatient Diabetes Program Recommendations  AACE/ADA: New Consensus Statement on Inpatient Glycemic Control (2015)  Target Ranges:  Prepandial:   less than 140 mg/dL      Peak postprandial:   less than 180 mg/dL (1-2 hours)      Critically ill patients:  140 - 180 mg/dL   Lab Results  Component Value Date   GLUCAP 216 (H) 02/10/2020   HGBA1C 11.8 (H) 12/21/2019    Review of Glycemic Control Results for ERYCA, BOLTE (MRN [id number]) as of 02/10/2020 09:54  Ref. Range 02/09/2020 16:09 02/09/2020 20:41 02/10/2020 07:33  Glucose-Capillary Latest Ref Range: 70 - 99 mg/dL 337 (H) 198 (H) 216 (H)  Diabetes history: DM 2 Outpatient Diabetes medications:NPH 20 units bid (if blood sugar>200 mg/dL). Uses Omnipod for boluses with meals (7-17 units tid with meals)  Current orders for Inpatient glycemic control: Novolog sensitive tid with meals and HS Novolog 10 units tid with meals Lantus 25 units bid  Inpatient Diabetes Program Recommendations:    Spoke with Dr. Pietro Cassis regarding possible insulin pump restart. Spoke with patient regarding insulin pump restart. She states that she is unable to restart insulin pump on her own due to her vision. Discussed with MD, and he decided to continue basal/bolus until patient able to go home.  Discussed glucose sensor with patient as well and recommended that she discuss with Dr. [name].  Patient appreciative of information.  Will follow.   Thanks,  Adah Perl, RN, BC-ADM Inpatient Diabetes Coordinator Pager [phone] (8a-5p)

## 2020-02-11 ENCOUNTER — Other Ambulatory Visit: Payer: Self-pay | Admitting: Family Medicine

## 2020-02-13 DIAGNOSIS — E039 Hypothyroidism, unspecified: Secondary | ICD-10-CM | POA: Diagnosis not present

## 2020-02-13 DIAGNOSIS — N184 Chronic kidney disease, stage 4 (severe): Secondary | ICD-10-CM | POA: Diagnosis not present

## 2020-02-13 DIAGNOSIS — I130 Hypertensive heart and chronic kidney disease with heart failure and stage 1 through stage 4 chronic kidney disease, or unspecified chronic kidney disease: Secondary | ICD-10-CM | POA: Diagnosis not present

## 2020-02-13 DIAGNOSIS — E785 Hyperlipidemia, unspecified: Secondary | ICD-10-CM | POA: Diagnosis not present

## 2020-02-13 DIAGNOSIS — I429 Cardiomyopathy, unspecified: Secondary | ICD-10-CM | POA: Diagnosis not present

## 2020-02-13 DIAGNOSIS — E1151 Type 2 diabetes mellitus with diabetic peripheral angiopathy without gangrene: Secondary | ICD-10-CM | POA: Diagnosis not present

## 2020-02-13 DIAGNOSIS — I2510 Atherosclerotic heart disease of native coronary artery without angina pectoris: Secondary | ICD-10-CM | POA: Diagnosis not present

## 2020-02-13 DIAGNOSIS — I252 Old myocardial infarction: Secondary | ICD-10-CM | POA: Diagnosis not present

## 2020-02-13 DIAGNOSIS — E1142 Type 2 diabetes mellitus with diabetic polyneuropathy: Secondary | ICD-10-CM | POA: Diagnosis not present

## 2020-02-13 DIAGNOSIS — F419 Anxiety disorder, unspecified: Secondary | ICD-10-CM | POA: Diagnosis not present

## 2020-02-13 DIAGNOSIS — I872 Venous insufficiency (chronic) (peripheral): Secondary | ICD-10-CM | POA: Diagnosis not present

## 2020-02-13 DIAGNOSIS — I5032 Chronic diastolic (congestive) heart failure: Secondary | ICD-10-CM | POA: Diagnosis not present

## 2020-02-13 DIAGNOSIS — E1122 Type 2 diabetes mellitus with diabetic chronic kidney disease: Secondary | ICD-10-CM | POA: Diagnosis not present

## 2020-02-14 ENCOUNTER — Telehealth: Payer: Self-pay | Admitting: *Deleted

## 2020-02-14 NOTE — Telephone Encounter (Signed)
Received call from [name] [facility] SN with [place] [phone] telephone.   Reports that patient is S/P hospitalization for DKA. Requested orders for [facility] 1x weekly x4 weeks for DM and medication education.   VO given.

## 2020-02-14 NOTE — Telephone Encounter (Signed)
Agree with below. Addendum:    I believe that she has limits with moving and including, toileting, bathing, feeding, dressing and grooming. I believe the power wheelchair is needed for pt to be able to perform ADL's in her home

## 2020-02-14 NOTE — Telephone Encounter (Signed)
Received call from patient.   Reports that she was discharged home from hospital with home O2 from [facility] per chart notes. States that she did not feel she needed it at that time and cancelled order.   Reports that she is having more difficulty breathing at night and would like to have oxygen delivered.   [facility] reports that there is no order in system.   Ok to order form [facility]?

## 2020-02-15 ENCOUNTER — Ambulatory Visit: Payer: PPO | Admitting: Family Medicine

## 2020-02-15 NOTE — Telephone Encounter (Signed)
Order placed

## 2020-02-15 NOTE — Telephone Encounter (Signed)
Okay to order oxygen

## 2020-02-16 DIAGNOSIS — K5730 Diverticulosis of large intestine without perforation or abscess without bleeding: Secondary | ICD-10-CM | POA: Diagnosis not present

## 2020-02-16 DIAGNOSIS — I5032 Chronic diastolic (congestive) heart failure: Secondary | ICD-10-CM | POA: Diagnosis not present

## 2020-02-16 DIAGNOSIS — D509 Iron deficiency anemia, unspecified: Secondary | ICD-10-CM | POA: Diagnosis not present

## 2020-02-16 DIAGNOSIS — R748 Abnormal levels of other serum enzymes: Secondary | ICD-10-CM | POA: Diagnosis not present

## 2020-02-21 DIAGNOSIS — R809 Proteinuria, unspecified: Secondary | ICD-10-CM | POA: Diagnosis not present

## 2020-02-21 DIAGNOSIS — R748 Abnormal levels of other serum enzymes: Secondary | ICD-10-CM | POA: Diagnosis not present

## 2020-02-21 DIAGNOSIS — G609 Hereditary and idiopathic neuropathy, unspecified: Secondary | ICD-10-CM | POA: Diagnosis not present

## 2020-02-21 DIAGNOSIS — N189 Chronic kidney disease, unspecified: Secondary | ICD-10-CM | POA: Diagnosis not present

## 2020-02-21 DIAGNOSIS — Z23 Encounter for immunization: Secondary | ICD-10-CM | POA: Diagnosis not present

## 2020-02-21 DIAGNOSIS — E11319 Type 2 diabetes mellitus with unspecified diabetic retinopathy without macular edema: Secondary | ICD-10-CM | POA: Diagnosis not present

## 2020-02-21 DIAGNOSIS — E1165 Type 2 diabetes mellitus with hyperglycemia: Secondary | ICD-10-CM | POA: Diagnosis not present

## 2020-02-21 DIAGNOSIS — E11621 Type 2 diabetes mellitus with foot ulcer: Secondary | ICD-10-CM | POA: Diagnosis not present

## 2020-02-21 DIAGNOSIS — Z9641 Presence of insulin pump (external) (internal): Secondary | ICD-10-CM | POA: Diagnosis not present

## 2020-02-21 DIAGNOSIS — I10 Essential (primary) hypertension: Secondary | ICD-10-CM | POA: Diagnosis not present

## 2020-02-21 DIAGNOSIS — E039 Hypothyroidism, unspecified: Secondary | ICD-10-CM | POA: Diagnosis not present

## 2020-02-23 ENCOUNTER — Telehealth: Payer: Self-pay | Admitting: *Deleted

## 2020-02-23 DIAGNOSIS — G4733 Obstructive sleep apnea (adult) (pediatric): Secondary | ICD-10-CM | POA: Diagnosis not present

## 2020-02-23 DIAGNOSIS — I129 Hypertensive chronic kidney disease with stage 1 through stage 4 chronic kidney disease, or unspecified chronic kidney disease: Secondary | ICD-10-CM | POA: Diagnosis not present

## 2020-02-23 DIAGNOSIS — N184 Chronic kidney disease, stage 4 (severe): Secondary | ICD-10-CM | POA: Diagnosis not present

## 2020-02-23 DIAGNOSIS — E1122 Type 2 diabetes mellitus with diabetic chronic kidney disease: Secondary | ICD-10-CM | POA: Diagnosis not present

## 2020-02-23 DIAGNOSIS — I509 Heart failure, unspecified: Secondary | ICD-10-CM | POA: Diagnosis not present

## 2020-02-23 NOTE — Telephone Encounter (Signed)
Received call from Gearldine Bienenstock, [place] PT with [facility]. [phone]~ telephone.   Requested VO for HH PT 1x weekly x1 week, 2x weekly x3 weeks, and 1x weekly x4 weeks for gait, balance and BLE strengthening.   VO given.

## 2020-02-24 ENCOUNTER — Encounter: Payer: Self-pay | Admitting: Family Medicine

## 2020-02-24 ENCOUNTER — Other Ambulatory Visit: Payer: Self-pay

## 2020-02-24 ENCOUNTER — Ambulatory Visit (INDEPENDENT_AMBULATORY_CARE_PROVIDER_SITE_OTHER): Payer: PPO | Admitting: Family Medicine

## 2020-02-24 VITALS — BP 132/84 | HR 84 | Temp 98.7°F | Resp 16 | Ht 66.0 in | Wt 267.0 lb

## 2020-02-24 DIAGNOSIS — I10 Essential (primary) hypertension: Secondary | ICD-10-CM

## 2020-02-24 DIAGNOSIS — F419 Anxiety disorder, unspecified: Secondary | ICD-10-CM | POA: Diagnosis not present

## 2020-02-24 DIAGNOSIS — J9601 Acute respiratory failure with hypoxia: Secondary | ICD-10-CM

## 2020-02-24 DIAGNOSIS — I429 Cardiomyopathy, unspecified: Secondary | ICD-10-CM | POA: Diagnosis not present

## 2020-02-24 DIAGNOSIS — E669 Obesity, unspecified: Secondary | ICD-10-CM

## 2020-02-24 DIAGNOSIS — E039 Hypothyroidism, unspecified: Secondary | ICD-10-CM | POA: Diagnosis not present

## 2020-02-24 DIAGNOSIS — I252 Old myocardial infarction: Secondary | ICD-10-CM | POA: Diagnosis not present

## 2020-02-24 DIAGNOSIS — J9600 Acute respiratory failure, unspecified whether with hypoxia or hypercapnia: Secondary | ICD-10-CM | POA: Insufficient documentation

## 2020-02-24 DIAGNOSIS — I2510 Atherosclerotic heart disease of native coronary artery without angina pectoris: Secondary | ICD-10-CM | POA: Diagnosis not present

## 2020-02-24 DIAGNOSIS — I872 Venous insufficiency (chronic) (peripheral): Secondary | ICD-10-CM | POA: Diagnosis not present

## 2020-02-24 DIAGNOSIS — I5032 Chronic diastolic (congestive) heart failure: Secondary | ICD-10-CM

## 2020-02-24 DIAGNOSIS — N184 Chronic kidney disease, stage 4 (severe): Secondary | ICD-10-CM

## 2020-02-24 DIAGNOSIS — E1142 Type 2 diabetes mellitus with diabetic polyneuropathy: Secondary | ICD-10-CM | POA: Diagnosis not present

## 2020-02-24 DIAGNOSIS — E1122 Type 2 diabetes mellitus with diabetic chronic kidney disease: Secondary | ICD-10-CM

## 2020-02-24 DIAGNOSIS — Z9119 Patient's noncompliance with other medical treatment and regimen: Secondary | ICD-10-CM | POA: Diagnosis not present

## 2020-02-24 DIAGNOSIS — E1151 Type 2 diabetes mellitus with diabetic peripheral angiopathy without gangrene: Secondary | ICD-10-CM | POA: Diagnosis not present

## 2020-02-24 DIAGNOSIS — I130 Hypertensive heart and chronic kidney disease with heart failure and stage 1 through stage 4 chronic kidney disease, or unspecified chronic kidney disease: Secondary | ICD-10-CM | POA: Diagnosis not present

## 2020-02-24 DIAGNOSIS — E785 Hyperlipidemia, unspecified: Secondary | ICD-10-CM | POA: Diagnosis not present

## 2020-02-24 DIAGNOSIS — Z91199 Patient's noncompliance with other medical treatment and regimen due to unspecified reason: Secondary | ICD-10-CM

## 2020-02-24 NOTE — Assessment & Plan Note (Signed)
Again discussed importance of adherence to dietary guidelines from her specialist Follow up with her appointments Taking meds as prescribed SW to be ordered

## 2020-02-24 NOTE — Progress Notes (Signed)
Subjective:    Patient ID: Susan [name], female    DOB: 09/15/1950, 69 y.o.   MRN: [id number]  Patient presents for Hospital F/U (DKA)  Patient here for hospital follow-up.  She was admitted to the hospital after having recurrent nausea vomiting diarrhea.  She been to the gastroenterologist this is been a recurrent issue for her.  Stool studies per the notes state that she had E. coli but she was not started on any antibiotics.  Due to poor intake she stopped using her insulin pump and therefore blood sugar spikes.  She became progressively weak and went to the emergency room. Note she has been to the ER few days before was found to have E. coli UTI but she left without being seen. ER blood sugar was greater than 600 and she was found to have DKA   Diabetic ketoacidosis due to poor DM type 2  management she had  stopped taking her insulin.  She has  Had follow-up with her endocrinologist.  She is now back on her insulin pump.  if  > 200 in the morning, Takes extra Novolin N and at bedtime 5 units Novolin  N   Due to her diarrhea state she had acute on chronic kidney injury.  Her creatinine peaked at 2.57 creatinine at discharge is 1.87.  Her torsemide was adjusted and she is now on 40 mg twice a day she is scheduled to see her nephrologist Dr. Hollie Salk Did have her electrolytes repleted while in the hospital. Now Back up to  80MG  bid sHE HAD labs drawn at nephrology yesterday   She was noted to have acute respiratory failure with hypoxia.  She was now on oxygen prior to hospitalization.  She is now on 2 L of oxygen. She has underlying CHF as well as coronary artery disease.   She needs orders for SW to help with transportation   No new conerns    Review Of Systems:  GEN- denies fatigue, fever, weight loss,weakness, recent illness HEENT- denies eye drainage, change in vision, nasal discharge, CVS- denies chest pain, palpitations RESP- denies SOB, cough, wheeze ABD- denies N/V,  change in stools, abd pain GU- denies dysuria, hematuria, dribbling, incontinence MSK- +joint pain, muscle aches, injury Neuro- denies headache, dizziness, syncope, seizure activity       Objective:    BP 132/84   Pulse 84   Temp 98.7 F (37.1 C) (Temporal)   Resp 16   Ht 5\' 6"  (1.676 m)   Wt 267 lb (121.1 kg)   SpO2 96%   BMI 43.09 kg/m  GEN- NAD, alert and oriented x3,walks with walker  HEENT- PERRL, EOMI, non injected sclera, pink conjunctiva, MMM, oropharynx clear Neck- Supple, no thyromegaly CVS- RRR, no murmur RESP-CTAB ABD-NABS,soft,NT,ND EXT- 1+ pitting edema with venous stasis changes, 2 spots with bandaides right leg, no drainge noted  Pulses- Radial palpated         Assessment & Plan:   Approx 20 minutes spent with pt > 50% on med review counseling    Problem List Items Addressed This Visit      Unprioritized   Acute respiratory failure ([place])    Likely MTF and with habitus, CHF history Continue oxygen therapy at bedtime  Elevated head of bed  Concern she may have OSA but she declines sleep study      Chronic diastolic CHF (congestive heart failure) (HCC) (Chronic)    Now on higher diuiretic ,s he has edema but no sign  of fluid in lungs       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 mellitus ([place]) - Primary   Class 3 obesity   Essential hypertension    Controlled no changes       Non compliance with medical treatment    Again discussed importance of adherence to dietary guidelines from her specialist Follow up with her appointments Taking meds as prescribed SW to be ordered          Note: This dictation was prepared with Dragon dictation along with smaller phrase technology. Any transcriptional errors that result from this process are unintentional.

## 2020-02-24 NOTE — Assessment & Plan Note (Addendum)
Likely MTF and with habitus, CHF history Continue oxygen therapy at bedtime  Elevated head of bed  Concern she may have OSA but she declines sleep study

## 2020-02-24 NOTE — Assessment & Plan Note (Signed)
Now on higher diuiretic ,s he has edema but no sign of fluid in lungs

## 2020-02-24 NOTE — Assessment & Plan Note (Signed)
Controlled no changes 

## 2020-02-24 NOTE — Assessment & Plan Note (Signed)
>>  ASSESSMENT AND PLAN FOR CHRONIC DIASTOLIC CHF (CONGESTIVE HEART FAILURE) (HCC) WRITTEN ON 02/24/2020  5:36 PM BY Milinda Antis F  Now on higher diuiretic ,s he has edema but no sign of fluid in lungs

## 2020-02-24 NOTE — Patient Instructions (Addendum)
F/U 4 months  Social Worker Referral  Wear your oxygen at bedtime

## 2020-02-24 NOTE — Telephone Encounter (Signed)
Agree with below. Addendum:    I believe that she has limits with moving and including, toileting, bathing, feeding, dressing and grooming. I believe the power wheelchair is needed for pt to be able to perform ADL's in her home

## 2020-02-27 ENCOUNTER — Telehealth: Payer: Self-pay | Admitting: *Deleted

## 2020-02-27 MED ORDER — ONDANSETRON 8 MG PO TBDP
8.0000 mg | ORAL_TABLET | Freq: Three times a day (TID) | ORAL | 0 refills | Status: DC
Start: 2020-02-27 — End: 2020-07-24

## 2020-02-27 NOTE — Telephone Encounter (Signed)
Call placed to patient and patient made aware.   States that if she is no better by Wed, she will call back.

## 2020-02-27 NOTE — Telephone Encounter (Signed)
Received call from patient.   Reports that she has nausea, vomiting and diarrhea. Requested refill on Zofran.   Prescription sent to pharmacy.   If patient is not improved, she will require OV.   Call placed to patient. No answer. No VM.

## 2020-03-01 ENCOUNTER — Ambulatory Visit (INDEPENDENT_AMBULATORY_CARE_PROVIDER_SITE_OTHER): Payer: PPO | Admitting: Orthopedic Surgery

## 2020-03-01 ENCOUNTER — Encounter: Payer: Self-pay | Admitting: Orthopedic Surgery

## 2020-03-01 VITALS — Ht 66.0 in | Wt 267.0 lb

## 2020-03-01 DIAGNOSIS — B351 Tinea unguium: Secondary | ICD-10-CM

## 2020-03-01 DIAGNOSIS — L97929 Non-pressure chronic ulcer of unspecified part of left lower leg with unspecified severity: Secondary | ICD-10-CM

## 2020-03-01 DIAGNOSIS — I87333 Chronic venous hypertension (idiopathic) with ulcer and inflammation of bilateral lower extremity: Secondary | ICD-10-CM

## 2020-03-01 DIAGNOSIS — L97919 Non-pressure chronic ulcer of unspecified part of right lower leg with unspecified severity: Secondary | ICD-10-CM | POA: Diagnosis not present

## 2020-03-01 NOTE — Progress Notes (Signed)
Office Visit Note   Patient: Susan [name]           Date of Birth: 30-Jul-1950           MRN: [id number] Visit Date: 03/01/2020              Requested by: Alycia Rossetti, MD [address] [place],  [place] [address] PCP: Alycia Rossetti, MD  Chief Complaint  Patient presents with  . Left Foot - Follow-up    Nail trim   . Right Leg - Edema  . Left Leg - Edema      HPI: Patient is a 69 year old woman who presents in follow-up for bilateral lower extremity she states she has had some bleeding from the venous ulcers in the right leg she is wearing knee-high compression socks around-the-clock she complains of thickened discolored onychomycotic nails on the left foot she is status post a transmetatarsal amputation of the right foot.  Assessment & Plan: Visit Diagnoses:  1. Onychomycosis   2. Idiopathic chronic venous hypertension of both lower extremities with ulcer and inflammation (HCC)     Plan: Nails were trimmed x5 without complications recommended that she continue wearing the extra-large compression socks bilaterally.  Follow-Up Instructions: Return in about 3 months (around 06/01/2020).   Ortho Exam  Patient is alert, oriented, no adenopathy, well-dressed, normal affect, normal respiratory effort. Examination patient has almost completely healed to venous ulcers on the right leg laterally mid calf region there is superficial epithelialization there is no cellulitis no odor no drainage no signs of infection.  Patient's both calfs measures 46 cm in circumference consistent with an extra-large compression sock.  She has thickened discolored onychomycotic nails x5 in the left foot she is unable to safely trim the nails on her own and the nails were trimmed x5 without complications.  Most recent hemoglobin A1c was 11.8 in July.  Her last uric acid was 9.1.  Imaging: No results found. No images are attached to the encounter.  Labs: Lab Results  Component Value  Date   HGBA1C 11.8 (H) 12/21/2019   HGBA1C 13.1 (H) 05/23/2019   HGBA1C 12.0 (H) 03/11/2019   CRP 14.4 (H) 05/28/2019   CRP 8.4 (H) 03/05/2018   LABURIC 9.1 (H) 10/12/2018   LABURIC 9.0 (H) 03/06/2018   REPTSTATUS 09/21/2019 FINAL 09/20/2019   CULT  09/20/2019    NO GROWTH Performed at [facility], [place] [address] [place], [place] [address]    LABORGA KLEBSIELLA PNEUMONIAE (A) 05/28/2019     Lab Results  Component Value Date   ALBUMIN 3.3 (L) 02/07/2020   ALBUMIN 3.1 (L) 02/06/2020   ALBUMIN 2.8 (L) 12/21/2019   LABURIC 9.1 (H) 10/12/2018   LABURIC 9.0 (H) 03/06/2018    Lab Results  Component Value Date   MG 2.0 02/10/2020   MG 2.1 02/09/2020   MG 1.7 02/08/2020   No results found for: VD25OH  No results found for: PREALBUMIN CBC EXTENDED Latest Ref Rng & Units 02/10/2020 02/09/2020 02/08/2020  WBC 4.0 - 10.5 K/uL 4.6 5.4 7.2  RBC 3.87 - 5.11 MIL/uL 3.88 4.11 3.88  HGB 12.0 - 15.0 g/dL 10.4(L) 11.0(L) 10.5(L)  HCT 36 - 46 % 34.5(L) 36.3 33.4(L)  PLT 150 - 400 K/uL 154 159 210  NEUTROABS 1.7 - 7.7 K/uL 3.2 4.0 5.6  LYMPHSABS 0.7 - 4.0 K/uL 0.6(L) 0.5(L) 0.8     Body mass index is 43.09 kg/m.  Orders:  No orders  of the defined types were placed in this encounter.  No orders of the defined types were placed in this encounter.    Procedures: No procedures performed  Clinical Data: No additional findings.  ROS:  All other systems negative, except as noted in the HPI. Review of Systems  Objective: Vital Signs: Ht 5\' 6"  (1.676 m)   Wt 267 lb (121.1 kg)   BMI 43.09 kg/m   Specialty Comments:  No specialty comments available.  PMFS History: Patient Active Problem List   Diagnosis Date Noted  . Acute respiratory failure ([place]) 02/24/2020  . Enteritis, enteropathogenic E. coli 02/10/2020  . DKA (diabetic ketoacidoses) 02/07/2020  . Common bile duct (CBD) obstruction 05/28/2019  . Benign neoplasm of ascending colon   . Benign neoplasm of  transverse colon   . Benign neoplasm of descending colon   . Benign neoplasm of sigmoid colon   . Gastric polyps   . Hyperkalemia 03/11/2019  . Prolonged QT interval 03/11/2019  . Onychomycosis 06/21/2018  . Pressure injury of skin 03/19/2018  . Syncope 03/18/2018  . Osteomyelitis of second toe of right foot ([place])   . Toe osteomyelitis, right ([place])   . 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 ([place])   . PVD (peripheral vascular disease) ([place]) 10/26/2017  . Hypothyroid 07/27/2017  . PAH (pulmonary artery hypertension) ([place])   . Impaired ambulation 07/19/2017  . Leg cramps 02/27/2017  . Peripheral edema 01/12/2017  . Diabetic neuropathy ([place]) 11/12/2016  . 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 mellitus ([place]) 10/24/2015  . Anemia 10/03/2015  . Generalized anxiety disorder 10/03/2015  . Insomnia 10/03/2015  . Chest pain   . Gastritis 06/07/2015  . Diabetes mellitus type II, uncontrolled ([place]) 06/07/2015  . Chronic diastolic CHF (congestive heart failure) ([place]) 06/07/2015  . Non compliance with medical treatment 04/17/2014  . Rotator cuff tear 03/14/2014  . Class 3 obesity 09/23/2013  . Acute kidney injury superimposed on chronic kidney disease ([place]) 12/25/2012  . Hypotension 12/25/2012  . Urinary incontinence   . MDD (major depressive disorder) 11/12/2010  . RBBB (right bundle branch block)   . CAD (coronary artery disease)   . Hyperlipemia 01/22/2009  . Essential hypertension 01/22/2009   Past Medical History:  Diagnosis Date  . Acute MI ([place]) 1999; 2007  . Anemia    hx  . Anginal pain ([place])   . Anxiety   . ARF (acute renal failure) ([place]) 06/2017   [facility]  . Arthritis    "generalized" (03/15/2014)  . CAD (coronary artery disease)    MI in 2000 - MI  2007 - treated bare metal stent (no nuclear since then as 9/11)  . Carotid artery disease ([place])   . CHF (congestive heart failure) ([place])   . Chronic diastolic heart failure (HCC)    a) ECHO  (08/2013) EF 55-60% and RV function nl b) RHC (08/2013) RA 4, RV 30/5/7, PA 25/10 (16), PCWP 7, Fick CO/CI 6.3/2.7, PVR 1.5 WU, PA 61 and 66%  . Daily headache    "~ every other day; since I fell in June" (03/15/2014)  . Depression   . Dyslipidemia   . Dyspnea   . Exertional shortness of breath   . HTN (hypertension)   . Hypothyroidism   . Neuropathy   . Obesity   . Osteoarthritis   . Peripheral neuropathy   . PONV (postoperative nausea and vomiting)   . RBBB (right bundle branch block)    Old  .  Stroke [facility])    mini strokes  . Syncope    likely due to low blood sugar  . Tachycardia    Sinus tachycardia  . Type II diabetes mellitus (HCC)    Type II  . Urinary incontinence   . Venous insufficiency     Family History  Problem Relation Age of Onset  . Heart attack Mother 41    Past Surgical History:  Procedure Laterality Date  . ABDOMINAL HYSTERECTOMY  1980's  . AMPUTATION Right 02/24/2018   Procedure: RIGHT FOOT GREAT TOE AND 2ND TOE AMPUTATION;  Surgeon: Newt Minion, MD;  Location: [facility];  Service: Orthopedics;  Laterality: Right;  . AMPUTATION Right 04/30/2018   Procedure: RIGHT TRANSMETATARSAL AMPUTATION;  Surgeon: Newt Minion, MD;  Location: [facility];  Service: Orthopedics;  Laterality: Right;  . CATARACT EXTRACTION, BILATERAL Bilateral ?2013  . COLONOSCOPY W/ POLYPECTOMY    . COLONOSCOPY WITH PROPOFOL N/A 03/13/2019   Procedure: COLONOSCOPY WITH PROPOFOL;  Surgeon: Jerene Bears, MD;  Location: [facility];  Service: Gastroenterology;  Laterality: N/A;  . [place]; 2007   "1 + 1"  . ESOPHAGOGASTRODUODENOSCOPY (EGD) WITH PROPOFOL N/A 03/13/2019   Procedure: ESOPHAGOGASTRODUODENOSCOPY (EGD) WITH PROPOFOL;  Surgeon: Jerene Bears, MD;  Location: [facility] ENDOSCOPY;  Service: Gastroenterology;  Laterality: N/A;  . EYE SURGERY Bilateral    [place]  . HEMOSTASIS CLIP PLACEMENT  03/13/2019   Procedure: HEMOSTASIS CLIP PLACEMENT;  Surgeon:  Jerene Bears, MD;  Location: [facility] ENDOSCOPY;  Service: Gastroenterology;;  . KNEE ARTHROSCOPY Left 10/25/2006  . POLYPECTOMY  03/13/2019   Procedure: POLYPECTOMY;  Surgeon: Jerene Bears, MD;  Location: [facility];  Service: Gastroenterology;;  . RIGHT HEART CATH N/A 07/24/2017   Procedure: RIGHT HEART CATH;  Surgeon: Jolaine Artist, MD;  Location: [facility] LAB;  Service: Cardiovascular;  Laterality: N/A;  . RIGHT HEART CATHETERIZATION N/A 09/22/2013   Procedure: RIGHT HEART CATH;  Surgeon: Jolaine Artist, MD;  Location: [facility] CATH LAB;  Service: Cardiovascular;  Laterality: N/A;  . SHOULDER ARTHROSCOPY WITH OPEN ROTATOR CUFF REPAIR Right 03/14/2014   Procedure: RIGHT SHOULDER ARTHROSCOPY WITH BICEPS RELEASE, OPEN SUBSCAPULA REPAIR, OPEN SUPRASPINATUS REPAIR.;  Surgeon: Meredith Pel, MD;  Location: [facility];  Service: Orthopedics;  Laterality: Right;  . TOE AMPUTATION Right 02/24/2018   GREAT TOE AND 2ND TOE AMPUTATION  . TUBAL LIGATION  1970's   Social History   Occupational History    Employer: UNEMPLOYED  Tobacco Use  . Smoking status: Former Smoker    Packs/day: 3.00    Years: 32.00    Pack years: 96.00    Types: Cigarettes    Quit date: 10/24/1997    Years since quitting: 22.3  . Smokeless tobacco: Never Used  Vaping Use  . Vaping Use: Never used  Substance and Sexual Activity  . Alcohol use: Not Currently    Comment: "might have 2-3 daiquiris in the summer"  . Drug use: No  . Sexual activity: Never

## 2020-03-06 ENCOUNTER — Telehealth: Payer: Self-pay | Admitting: Orthopedic Surgery

## 2020-03-06 NOTE — Telephone Encounter (Signed)
I called Susan [name] with [facility] and advised. She states that patient has area above compression hose that we would have seen in the office where fluid is leaking. Patient has started to scratch it and Susan [name] is worried that this will become infected. She asked for verbal order to monitor this wound as well. Verbal given.

## 2020-03-06 NOTE — Telephone Encounter (Signed)
Please advise 

## 2020-03-06 NOTE — Telephone Encounter (Signed)
Compression hose should be on at all times except to shower and then changed to new clean hose after shower. Just need to cleanse legs with antibacterial soap and water

## 2020-03-06 NOTE — Telephone Encounter (Signed)
[facility]an [name] with [facility] called asking that we call her back with verbal orders for treating the pt for infection and for how long the compression hose should be on   [place] CB# [phone]

## 2020-03-07 ENCOUNTER — Other Ambulatory Visit: Payer: Self-pay

## 2020-03-07 ENCOUNTER — Emergency Department (HOSPITAL_COMMUNITY): Payer: PPO

## 2020-03-07 ENCOUNTER — Other Ambulatory Visit: Payer: Self-pay | Admitting: *Deleted

## 2020-03-07 ENCOUNTER — Encounter (HOSPITAL_COMMUNITY): Payer: Self-pay | Admitting: Family Medicine

## 2020-03-07 ENCOUNTER — Encounter: Payer: Self-pay | Admitting: Family Medicine

## 2020-03-07 ENCOUNTER — Ambulatory Visit (INDEPENDENT_AMBULATORY_CARE_PROVIDER_SITE_OTHER): Payer: PPO | Admitting: Family Medicine

## 2020-03-07 ENCOUNTER — Inpatient Hospital Stay (HOSPITAL_COMMUNITY)
Admission: EM | Admit: 2020-03-07 | Discharge: 2020-03-13 | DRG: 177 | Disposition: A | Discharge status: Home Health | Payer: PPO | Source: Ambulatory Visit | Attending: Internal Medicine | Admitting: Internal Medicine

## 2020-03-07 VITALS — BP 182/84 | HR 114 | Temp 100.6°F | Resp 24

## 2020-03-07 DIAGNOSIS — I2510 Atherosclerotic heart disease of native coronary artery without angina pectoris: Secondary | ICD-10-CM | POA: Diagnosis not present

## 2020-03-07 DIAGNOSIS — R079 Chest pain, unspecified: Secondary | ICD-10-CM

## 2020-03-07 DIAGNOSIS — E785 Hyperlipidemia, unspecified: Secondary | ICD-10-CM | POA: Diagnosis present

## 2020-03-07 DIAGNOSIS — F419 Anxiety disorder, unspecified: Secondary | ICD-10-CM | POA: Diagnosis present

## 2020-03-07 DIAGNOSIS — R112 Nausea with vomiting, unspecified: Secondary | ICD-10-CM

## 2020-03-07 DIAGNOSIS — Z209 Contact with and (suspected) exposure to unspecified communicable disease: Secondary | ICD-10-CM | POA: Diagnosis not present

## 2020-03-07 DIAGNOSIS — R0602 Shortness of breath: Secondary | ICD-10-CM | POA: Diagnosis not present

## 2020-03-07 DIAGNOSIS — N184 Chronic kidney disease, stage 4 (severe): Secondary | ICD-10-CM | POA: Diagnosis not present

## 2020-03-07 DIAGNOSIS — I5032 Chronic diastolic (congestive) heart failure: Secondary | ICD-10-CM | POA: Diagnosis not present

## 2020-03-07 DIAGNOSIS — R0902 Hypoxemia: Secondary | ICD-10-CM | POA: Diagnosis not present

## 2020-03-07 DIAGNOSIS — R042 Hemoptysis: Secondary | ICD-10-CM

## 2020-03-07 DIAGNOSIS — I5033 Acute on chronic diastolic (congestive) heart failure: Secondary | ICD-10-CM | POA: Diagnosis not present

## 2020-03-07 DIAGNOSIS — D509 Iron deficiency anemia, unspecified: Secondary | ICD-10-CM | POA: Diagnosis present

## 2020-03-07 DIAGNOSIS — Z7902 Long term (current) use of antithrombotics/antiplatelets: Secondary | ICD-10-CM

## 2020-03-07 DIAGNOSIS — J1282 Pneumonia due to coronavirus disease 2019: Secondary | ICD-10-CM | POA: Diagnosis not present

## 2020-03-07 DIAGNOSIS — I10 Essential (primary) hypertension: Secondary | ICD-10-CM | POA: Diagnosis present

## 2020-03-07 DIAGNOSIS — E66813 Obesity, class 3: Secondary | ICD-10-CM | POA: Diagnosis present

## 2020-03-07 DIAGNOSIS — Z6841 Body Mass Index (BMI) 40.0 and over, adult: Secondary | ICD-10-CM

## 2020-03-07 DIAGNOSIS — E1140 Type 2 diabetes mellitus with diabetic neuropathy, unspecified: Secondary | ICD-10-CM | POA: Diagnosis present

## 2020-03-07 DIAGNOSIS — E1142 Type 2 diabetes mellitus with diabetic polyneuropathy: Secondary | ICD-10-CM | POA: Diagnosis present

## 2020-03-07 DIAGNOSIS — I130 Hypertensive heart and chronic kidney disease with heart failure and stage 1 through stage 4 chronic kidney disease, or unspecified chronic kidney disease: Secondary | ICD-10-CM | POA: Diagnosis present

## 2020-03-07 DIAGNOSIS — Z885 Allergy status to narcotic agent status: Secondary | ICD-10-CM | POA: Diagnosis not present

## 2020-03-07 DIAGNOSIS — I252 Old myocardial infarction: Secondary | ICD-10-CM

## 2020-03-07 DIAGNOSIS — I4510 Unspecified right bundle-branch block: Secondary | ICD-10-CM | POA: Diagnosis not present

## 2020-03-07 DIAGNOSIS — J9601 Acute respiratory failure with hypoxia: Secondary | ICD-10-CM | POA: Diagnosis present

## 2020-03-07 DIAGNOSIS — Z90710 Acquired absence of both cervix and uterus: Secondary | ICD-10-CM

## 2020-03-07 DIAGNOSIS — R000 Tachycardia, unspecified: Secondary | ICD-10-CM

## 2020-03-07 DIAGNOSIS — R52 Pain, unspecified: Secondary | ICD-10-CM | POA: Diagnosis not present

## 2020-03-07 DIAGNOSIS — I25119 Atherosclerotic heart disease of native coronary artery with unspecified angina pectoris: Secondary | ICD-10-CM | POA: Diagnosis present

## 2020-03-07 DIAGNOSIS — M25569 Pain in unspecified knee: Secondary | ICD-10-CM

## 2020-03-07 DIAGNOSIS — R1084 Generalized abdominal pain: Secondary | ICD-10-CM | POA: Diagnosis not present

## 2020-03-07 DIAGNOSIS — U071 COVID-19: Principal | ICD-10-CM | POA: Diagnosis present

## 2020-03-07 DIAGNOSIS — Z7982 Long term (current) use of aspirin: Secondary | ICD-10-CM

## 2020-03-07 DIAGNOSIS — E1122 Type 2 diabetes mellitus with diabetic chronic kidney disease: Secondary | ICD-10-CM | POA: Diagnosis not present

## 2020-03-07 DIAGNOSIS — M109 Gout, unspecified: Secondary | ICD-10-CM | POA: Diagnosis not present

## 2020-03-07 DIAGNOSIS — R531 Weakness: Secondary | ICD-10-CM | POA: Diagnosis present

## 2020-03-07 DIAGNOSIS — F32A Depression, unspecified: Secondary | ICD-10-CM | POA: Diagnosis present

## 2020-03-07 DIAGNOSIS — M1990 Unspecified osteoarthritis, unspecified site: Secondary | ICD-10-CM | POA: Diagnosis present

## 2020-03-07 DIAGNOSIS — E039 Hypothyroidism, unspecified: Secondary | ICD-10-CM | POA: Diagnosis not present

## 2020-03-07 DIAGNOSIS — Z8673 Personal history of transient ischemic attack (TIA), and cerebral infarction without residual deficits: Secondary | ICD-10-CM

## 2020-03-07 DIAGNOSIS — E1151 Type 2 diabetes mellitus with diabetic peripheral angiopathy without gangrene: Secondary | ICD-10-CM | POA: Diagnosis not present

## 2020-03-07 DIAGNOSIS — Z79890 Hormone replacement therapy (postmenopausal): Secondary | ICD-10-CM

## 2020-03-07 DIAGNOSIS — E860 Dehydration: Secondary | ICD-10-CM | POA: Diagnosis not present

## 2020-03-07 DIAGNOSIS — Z794 Long term (current) use of insulin: Secondary | ICD-10-CM

## 2020-03-07 DIAGNOSIS — I5043 Acute on chronic combined systolic (congestive) and diastolic (congestive) heart failure: Secondary | ICD-10-CM | POA: Diagnosis present

## 2020-03-07 DIAGNOSIS — E1165 Type 2 diabetes mellitus with hyperglycemia: Secondary | ICD-10-CM

## 2020-03-07 DIAGNOSIS — I509 Heart failure, unspecified: Secondary | ICD-10-CM | POA: Diagnosis not present

## 2020-03-07 DIAGNOSIS — Z87891 Personal history of nicotine dependence: Secondary | ICD-10-CM

## 2020-03-07 DIAGNOSIS — M5489 Other dorsalgia: Secondary | ICD-10-CM | POA: Diagnosis not present

## 2020-03-07 DIAGNOSIS — Z8249 Family history of ischemic heart disease and other diseases of the circulatory system: Secondary | ICD-10-CM

## 2020-03-07 DIAGNOSIS — Z79899 Other long term (current) drug therapy: Secondary | ICD-10-CM

## 2020-03-07 DIAGNOSIS — I110 Hypertensive heart disease with heart failure: Secondary | ICD-10-CM | POA: Diagnosis not present

## 2020-03-07 DIAGNOSIS — I872 Venous insufficiency (chronic) (peripheral): Secondary | ICD-10-CM | POA: Diagnosis present

## 2020-03-07 LAB — FERRITIN: Ferritin: 187 ng/mL (ref 11–307)

## 2020-03-07 LAB — CBG MONITORING, ED: Glucose-Capillary: 147 mg/dL — ABNORMAL HIGH (ref 70–99)

## 2020-03-07 LAB — CBC WITH DIFFERENTIAL/PLATELET
Abs Immature Granulocytes: 0.03 10*3/uL (ref 0.00–0.07)
Basophils Absolute: 0 10*3/uL (ref 0.0–0.1)
Basophils Relative: 0 %
Eosinophils Absolute: 0 10*3/uL (ref 0.0–0.5)
Eosinophils Relative: 0 %
HCT: 32.7 % — ABNORMAL LOW (ref 36.0–46.0)
Hemoglobin: 9.8 g/dL — ABNORMAL LOW (ref 12.0–15.0)
Immature Granulocytes: 1 %
Lymphocytes Relative: 4 %
Lymphs Abs: 0.2 10*3/uL — ABNORMAL LOW (ref 0.7–4.0)
MCH: 26.1 pg (ref 26.0–34.0)
MCHC: 30 g/dL (ref 30.0–36.0)
MCV: 87.2 fL (ref 80.0–100.0)
Monocytes Absolute: 0.4 10*3/uL (ref 0.1–1.0)
Monocytes Relative: 7 %
Neutro Abs: 4.7 10*3/uL (ref 1.7–7.7)
Neutrophils Relative %: 88 %
Platelets: 195 10*3/uL (ref 150–400)
RBC: 3.75 MIL/uL — ABNORMAL LOW (ref 3.87–5.11)
RDW: 16.1 % — ABNORMAL HIGH (ref 11.5–15.5)
WBC: 5.4 10*3/uL (ref 4.0–10.5)
nRBC: 0 % (ref 0.0–0.2)

## 2020-03-07 LAB — RESPIRATORY PANEL BY RT PCR (FLU A&B, COVID)
Influenza A by PCR: NEGATIVE
Influenza B by PCR: NEGATIVE
SARS Coronavirus 2 by RT PCR: POSITIVE — AB

## 2020-03-07 LAB — BASIC METABOLIC PANEL
Anion gap: 11 (ref 5–15)
BUN: 49 mg/dL — ABNORMAL HIGH (ref 8–23)
CO2: 27 mmol/L (ref 22–32)
Calcium: 8.7 mg/dL — ABNORMAL LOW (ref 8.9–10.3)
Chloride: 99 mmol/L (ref 98–111)
Creatinine, Ser: 1.88 mg/dL — ABNORMAL HIGH (ref 0.44–1.00)
GFR, Estimated: 27 mL/min — ABNORMAL LOW (ref >60)
Glucose, Bld: 164 mg/dL — ABNORMAL HIGH (ref 70–99)
Potassium: 3.6 mmol/L (ref 3.5–5.1)
Sodium: 137 mmol/L (ref 135–145)

## 2020-03-07 LAB — LACTIC ACID, PLASMA
Lactic Acid, Venous: 1 mmol/L (ref 0.5–1.9)
Lactic Acid, Venous: 0.9 mmol/L (ref 0.5–1.9)

## 2020-03-07 LAB — LACTATE DEHYDROGENASE
LDH: 182 U/L (ref 98–192)
LDH: 178 U/L (ref 98–192)

## 2020-03-07 LAB — D-DIMER, QUANTITATIVE: D-Dimer, Quant: 1.79 ug/mL-FEU — ABNORMAL HIGH (ref 0.00–0.50)

## 2020-03-07 LAB — PROCALCITONIN: Procalcitonin: 0.31 ng/mL

## 2020-03-07 LAB — TRIGLYCERIDES: Triglycerides: 125 mg/dL (ref <150)

## 2020-03-07 LAB — GLUCOSE 16585: Glucose: 224 mg/dL — ABNORMAL HIGH (ref 65–99)

## 2020-03-07 LAB — BRAIN NATRIURETIC PEPTIDE: B Natriuretic Peptide: 1301.4 pg/mL — ABNORMAL HIGH (ref 0.0–100.0)

## 2020-03-07 LAB — C-REACTIVE PROTEIN: CRP: 10.7 mg/dL — ABNORMAL HIGH (ref <1.0)

## 2020-03-07 LAB — FIBRINOGEN: Fibrinogen: 409 mg/dL (ref 210–475)

## 2020-03-07 MED ORDER — SODIUM CHLORIDE 0.9 % IV SOLN
200.0000 mg | Freq: Once | INTRAVENOUS | Status: AC
  Administered 2020-03-07: 200 mg via INTRAVENOUS
  Filled 2020-03-07: qty 40

## 2020-03-07 MED ORDER — FUROSEMIDE 10 MG/ML IJ SOLN
40.0000 mg | Freq: Two times a day (BID) | INTRAMUSCULAR | Status: AC
  Administered 2020-03-08 – 2020-03-09 (×3): 40 mg via INTRAVENOUS
  Filled 2020-03-07 (×3): qty 4

## 2020-03-07 MED ORDER — CLOPIDOGREL BISULFATE 75 MG PO TABS
75.0000 mg | ORAL_TABLET | Freq: Every day | ORAL | Status: DC
  Administered 2020-03-08 – 2020-03-13 (×6): 75 mg via ORAL
  Filled 2020-03-07 (×6): qty 1

## 2020-03-07 MED ORDER — HEPARIN BOLUS VIA INFUSION
4400.0000 [IU] | Freq: Once | INTRAVENOUS | Status: AC
  Administered 2020-03-08: 4400 [IU] via INTRAVENOUS
  Filled 2020-03-07: qty 4400

## 2020-03-07 MED ORDER — SODIUM CHLORIDE 0.9 % IV SOLN
100.0000 mg | Freq: Every day | INTRAVENOUS | Status: AC
  Administered 2020-03-08 – 2020-03-11 (×4): 100 mg via INTRAVENOUS
  Filled 2020-03-07 (×5): qty 20

## 2020-03-07 MED ORDER — GUAIFENESIN-DM 100-10 MG/5ML PO SYRP
10.0000 mL | ORAL_SOLUTION | ORAL | Status: DC | PRN
  Administered 2020-03-08 – 2020-03-13 (×6): 10 mL via ORAL
  Filled 2020-03-07 (×6): qty 10

## 2020-03-07 MED ORDER — FUROSEMIDE 10 MG/ML IJ SOLN
20.0000 mg | Freq: Once | INTRAMUSCULAR | Status: AC
  Administered 2020-03-07: 20 mg via INTRAVENOUS
  Filled 2020-03-07: qty 2

## 2020-03-07 MED ORDER — LEVOTHYROXINE SODIUM 50 MCG PO TABS
50.0000 ug | ORAL_TABLET | Freq: Every day | ORAL | Status: DC
  Administered 2020-03-08 – 2020-03-13 (×6): 50 ug via ORAL
  Filled 2020-03-07 (×6): qty 1

## 2020-03-07 MED ORDER — INSULIN ASPART 100 UNIT/ML ~~LOC~~ SOLN
0.0000 [IU] | Freq: Every day | SUBCUTANEOUS | Status: DC
  Administered 2020-03-09: 5 [IU] via SUBCUTANEOUS
  Administered 2020-03-10: 3 [IU] via SUBCUTANEOUS
  Administered 2020-03-11: 4 [IU] via SUBCUTANEOUS
  Administered 2020-03-12: 3 [IU] via SUBCUTANEOUS

## 2020-03-07 MED ORDER — METHYLPREDNISOLONE SODIUM SUCC 125 MG IJ SOLR
1.0000 mg/kg | Freq: Two times a day (BID) | INTRAMUSCULAR | Status: DC
  Administered 2020-03-08 – 2020-03-09 (×3): 122.5 mg via INTRAVENOUS
  Filled 2020-03-07 (×3): qty 2

## 2020-03-07 MED ORDER — POTASSIUM CHLORIDE CRYS ER 20 MEQ PO TBCR
40.0000 meq | EXTENDED_RELEASE_TABLET | Freq: Once | ORAL | Status: AC
  Administered 2020-03-07: 40 meq via ORAL
  Filled 2020-03-07: qty 2

## 2020-03-07 MED ORDER — CARVEDILOL 12.5 MG PO TABS
12.5000 mg | ORAL_TABLET | Freq: Two times a day (BID) | ORAL | Status: DC
  Administered 2020-03-08 – 2020-03-13 (×11): 12.5 mg via ORAL
  Filled 2020-03-07 (×11): qty 1

## 2020-03-07 MED ORDER — SODIUM CHLORIDE 0.9 % IV SOLN
500.0000 mg | Freq: Once | INTRAVENOUS | Status: AC
  Administered 2020-03-07: 500 mg via INTRAVENOUS
  Filled 2020-03-07: qty 500

## 2020-03-07 MED ORDER — PREDNISONE 20 MG PO TABS: 50.0000 mg | ORAL_TABLET | Freq: Every day | ORAL | Status: DC

## 2020-03-07 MED ORDER — HEPARIN (PORCINE) 25000 UT/250ML-% IV SOLN
1500.0000 [IU]/h | INTRAVENOUS | Status: DC
  Administered 2020-03-08: 1400 [IU]/h via INTRAVENOUS
  Filled 2020-03-07: qty 250

## 2020-03-07 MED ORDER — HYDROCODONE-ACETAMINOPHEN 5-325 MG PO TABS: 1.0000 | ORAL_TABLET | Freq: Three times a day (TID) | ORAL | Status: DC | PRN

## 2020-03-07 MED ORDER — ACETAMINOPHEN 325 MG PO TABS
650.0000 mg | ORAL_TABLET | Freq: Four times a day (QID) | ORAL | Status: DC | PRN
  Administered 2020-03-09: 650 mg via ORAL
  Filled 2020-03-07: qty 2

## 2020-03-07 MED ORDER — ALBUTEROL SULFATE HFA 108 (90 BASE) MCG/ACT IN AERS
2.0000 | INHALATION_SPRAY | RESPIRATORY_TRACT | Status: DC | PRN
  Filled 2020-03-07: qty 6.7

## 2020-03-07 MED ORDER — MAGNESIUM OXIDE 400 (241.3 MG) MG PO TABS
400.0000 mg | ORAL_TABLET | Freq: Every day | ORAL | Status: DC
  Administered 2020-03-09 – 2020-03-13 (×5): 400 mg via ORAL
  Filled 2020-03-07 (×7): qty 1

## 2020-03-07 MED ORDER — ALLOPURINOL 100 MG PO TABS
100.0000 mg | ORAL_TABLET | Freq: Two times a day (BID) | ORAL | Status: DC
  Administered 2020-03-08 – 2020-03-13 (×11): 100 mg via ORAL
  Filled 2020-03-07 (×12): qty 1

## 2020-03-07 MED ORDER — SENNOSIDES-DOCUSATE SODIUM 8.6-50 MG PO TABS: 1.0000 | ORAL_TABLET | Freq: Every evening | ORAL | Status: DC | PRN

## 2020-03-07 MED ORDER — SODIUM CHLORIDE 0.9 % IV SOLN: 100.0000 mg | Freq: Every day | INTRAVENOUS | Status: DC

## 2020-03-07 MED ORDER — TRAZODONE HCL 50 MG PO TABS
100.0000 mg | ORAL_TABLET | Freq: Every day | ORAL | Status: DC
  Administered 2020-03-08 – 2020-03-12 (×5): 100 mg via ORAL
  Filled 2020-03-07 (×5): qty 2

## 2020-03-07 MED ORDER — ASPIRIN EC 81 MG PO TBEC
81.0000 mg | DELAYED_RELEASE_TABLET | Freq: Every morning | ORAL | Status: DC
  Administered 2020-03-08 – 2020-03-13 (×6): 81 mg via ORAL
  Filled 2020-03-07 (×6): qty 1

## 2020-03-07 MED ORDER — INSULIN ASPART 100 UNIT/ML ~~LOC~~ SOLN
0.0000 [IU] | Freq: Three times a day (TID) | SUBCUTANEOUS | Status: DC
  Administered 2020-03-08: 20 [IU] via SUBCUTANEOUS
  Administered 2020-03-08 (×2): 11 [IU] via SUBCUTANEOUS
  Administered 2020-03-09: 20 [IU] via SUBCUTANEOUS
  Administered 2020-03-10 (×2): 15 [IU] via SUBCUTANEOUS
  Administered 2020-03-10: 20 [IU] via SUBCUTANEOUS
  Administered 2020-03-11: 7 [IU] via SUBCUTANEOUS
  Administered 2020-03-11 (×2): 3 [IU] via SUBCUTANEOUS
  Administered 2020-03-12: 7 [IU] via SUBCUTANEOUS
  Administered 2020-03-12: 4 [IU] via SUBCUTANEOUS

## 2020-03-07 MED ORDER — SODIUM CHLORIDE 0.9 % IV SOLN: 200.0000 mg | Freq: Once | INTRAVENOUS | Status: DC

## 2020-03-07 MED ORDER — SODIUM CHLORIDE 0.9 % IV SOLN
1.0000 g | Freq: Once | INTRAVENOUS | Status: AC
  Administered 2020-03-07: 1 g via INTRAVENOUS
  Filled 2020-03-07: qty 10

## 2020-03-07 MED ORDER — PREGABALIN 75 MG PO CAPS
150.0000 mg | ORAL_CAPSULE | Freq: Two times a day (BID) | ORAL | Status: DC
  Administered 2020-03-08 – 2020-03-13 (×11): 150 mg via ORAL
  Filled 2020-03-07 (×11): qty 2

## 2020-03-07 MED ORDER — METHYLPREDNISOLONE SODIUM SUCC 125 MG IJ SOLR
125.0000 mg | Freq: Once | INTRAMUSCULAR | Status: AC
  Administered 2020-03-07: 125 mg via INTRAVENOUS
  Filled 2020-03-07: qty 2

## 2020-03-07 MED ORDER — PANTOPRAZOLE SODIUM 40 MG PO TBEC
40.0000 mg | DELAYED_RELEASE_TABLET | Freq: Every day | ORAL | Status: DC
  Administered 2020-03-08 – 2020-03-13 (×6): 40 mg via ORAL
  Filled 2020-03-07 (×6): qty 1

## 2020-03-07 MED ORDER — ACETAMINOPHEN 325 MG PO TABS
650.0000 mg | ORAL_TABLET | Freq: Once | ORAL | Status: AC
  Administered 2020-03-07: 650 mg via ORAL
  Filled 2020-03-07: qty 2

## 2020-03-07 NOTE — ED Notes (Signed)
At change of shift, pt comfortable in stretcher, nadn, vss on ccm. Pt talking on cell phone to family without difficulty. Pt states been sick for almost two week with sob and right leg swelling. HX dm, and chf. Pt covid + at this time. On 4 liters [place], spo2 97%. Pt provided call bell, side rails up.

## 2020-03-07 NOTE — ED Notes (Signed)
3 iv attempts made by this rn without success, iv team consulted

## 2020-03-07 NOTE — ED Triage Notes (Signed)
Pt arrived [facility] from [employer] family Practise r/t sob, weakness, nausea, vomit, fever chills. EMS vitals 158/82 100 HR 93% 4L. Gave 4 mg of Zofran enroute

## 2020-03-07 NOTE — Assessment & Plan Note (Addendum)
Pt with recurrent resporatry failure but with multiple factors at play New cough with hemotpysis concerning for new possible PNA other infectious etiology  She is on plavix, but PE also possibility with recent admission for DKA and decreased mobility   CHF also of concern with her edema and symptoms She has not had any meds and BP quite elevated in the office  DKA a few weeks ago, CBG looks okay despite not taking insulin  But expect renal function as worsened with the GI losses  EKG shows sinus tachycardia/SVT with no ST elevation or significant depression, compared to previous  Sat 92% on 4L of oxygen   EMS called pt to be transported to the hospital, pt husband at bedside    EMS on scene, BP systolic now  072'T  continued tachycardia but sat stable on 4L  Sepsis alert sent via EMS to hospital

## 2020-03-07 NOTE — Progress Notes (Signed)
ANTICOAGULATION CONSULT NOTE - Initial Consult  Pharmacy Consult for heparin gtt Indication: rule out PE   Allergies  Allergen Reactions  . Codeine Nausea And Vomiting    Patient Measurements: Height: 5\' 6"  (167.6 cm) Weight: 122.5 kg (270 lb) IBW/kg (Calculated) : 59.3 Heparin Dosing Weight: 88.6kg  Vital Signs: Temp: 103 F (39.4 C) (10/13 1730) Temp Source: Rectal (10/13 1730) BP: 137/119 (10/13 2015) Pulse Rate: 83 (10/13 2015)  Labs: Recent Labs    03/07/20 1816  HGB 9.8*  HCT 32.7*  PLT 195  CREATININE 1.88*    Estimated Creatinine Clearance: 37.2 mL/min (A) (by C-G formula based on SCr of 1.88 mg/dL (H)).   Assessment: 69 year old female found to be COVID positive with symptoms beginning ~5 days prior to presentation to the ED.Pt has CKDIII and concern for PE.   CBC-- Hgb 9.8; Plt 195; Ddimer 1.79  Goal of Therapy:  Heparin level 0.3-0.7 units/ml Monitor platelets by anticoagulation protocol: Yes   Plan:  Heparin 4400 units bolus IV x1 followed by  Heparin 1400 units/hr infusion  8h HL 0500  Monitor s/sx bleeding, CBC, HL,  clinical progress, and imaging studies    Carolin Guernsey  PGY1 pharmacy resident 03/07/2020,9:02 PM

## 2020-03-07 NOTE — ED Notes (Signed)
Iv team at bedside  

## 2020-03-07 NOTE — ED Notes (Signed)
md pfeiffer at bedside

## 2020-03-07 NOTE — H&P (Signed)
History and Physical    Breion Jeppsen [facility] ZOX:[id number] DOB: Oct 03, 1950 DOA: 03/07/2020  PCP: Salley Scarlet, MD   Patient coming from: PCP office via home   Chief Complaint: Shortness of breath, cough, fever, nausea, vomiting for 5 days  HPI: Susan [name] is a 69 y.o. female with medical history significant for diastolic CHF, hypertension, diabetes mellitus type 2, CKD stage IV, obesity, peripheral vascular disease, chronic neuropathy presents to the emergency room by EMS with complaint of shortness of breath, fever, cough, generalized weakness, nausea vomiting.  She reports that the symptoms began approximately 5 days ago with worsening shortness of breath with exertion and a nonproductive cough for the last 5 days.  She was seen by her PCP earlier today and was found to be hypoxic at 82% on room air which improved when placed on oxygen by nasal cannula.  She was also found to have a fever.  She has not been to tolerate her home medications secondary to nausea and vomiting the last few days.  She does report some abdominal discomfort a few days ago and mild sharp intermittent chest pain that occurs with cough or turning of her trunk.  She states she has chronic lower extremity edema that seems a little worse on the right side.  She does not have any history of PE or DVTs.  She is on aspirin and Plavix at home.  She reports she has been vaccinated against COVID-19 with a post shots being administered in March and April of this year.  She lives with her husband.  She reports her husband has not been sick.  She has no known sick contacts or known COVID-19 exposures.  She did not take any over-the-counter cough or cold medications.  She states her sugars have been running 150-250 and states she had a hemoglobin A1c few weeks ago that was 11. She has a history of smoking but quit 1997.  Denies alcohol or illicit drug use.  ED Course: In the emergency room patient is found to be COVID-19  positive.  She is started on remdesivir and given Solu-Medrol.  She is requiring oxygen supplementation with nasal cannula to maintain O2 sat greater than 92%.  She does not use oxygen at home.  Hospitalist service was asked to admit for further management  Review of Systems:  General: Reports generalized weakness.  Reports fever, chills.reports mild dizziness over the past few days.  Denies weight loss, night sweats. Denies change in appetite HENT: Denies head trauma, headache, denies change in hearing, tinnitus.  Denies nasal congestion or bleeding.  Denies sore throat, sores in mouth.  Denies difficulty swallowing Eyes: Denies blurry vision, pain in eye, drainage.  Denies discoloration of eyes. Neck: Denies pain.  Denies swelling.  Denies pain with movement. Cardiovascular: Denies chest pain, palpitations.  Reports chronic edema.  Denies orthopnea Respiratory: Reports shortness of breath worsened with exertion.  Reports dry cough.  Denies wheezing.  Denies sputum production Gastrointestinal: Denies abdominal pain, swelling.  Reports nausea, vomiting last few days but no diarrhea.  Denies melena.  Denies hematemesis. Musculoskeletal: Denies limitation of movement.  Denies deformity.  Denies pain.  Reports chronic pain in legs is unchanged Genitourinary: Denies pelvic pain.  Denies urinary frequency or hesitancy.  Denies dysuria.  Skin: Denies rash.  Denies petechiae, purpura, ecchymosis. Neurological: Denies headache.  Denies syncope.  Denies seizure activity.  Denies weakness.  Denies slurred speech, drooping face.  Denies visual change. Psychiatric: Denies depression, anxiety.  Denies suicidal  thoughts or ideation.  Denies hallucinations.  Past Medical History:  Diagnosis Date  . Acute MI (HCC) 1999; 2007  . Anemia    hx  . Anginal pain (HCC)   . Anxiety   . ARF (acute renal failure) (HCC) 06/2017   [facility]  . Arthritis    "generalized" (03/15/2014)  . CAD (coronary artery  disease)    MI in 2000 - MI  2007 - treated bare metal stent (no nuclear since then as 9/11)  . Carotid artery disease (HCC)   . CHF (congestive heart failure) (HCC)   . Chronic diastolic heart failure (HCC)    a) ECHO (08/2013) EF 55-60% and RV function nl b) RHC (08/2013) RA 4, RV 30/5/7, PA 25/10 (16), PCWP 7, Fick CO/CI 6.3/2.7, PVR 1.5 WU, PA 61 and 66%  . Daily headache    "~ every other day; since I fell in June" (03/15/2014)  . Depression   . Dyslipidemia   . Dyspnea   . Exertional shortness of breath   . HTN (hypertension)   . Hypothyroidism   . Neuropathy   . Obesity   . Osteoarthritis   . Peripheral neuropathy   . PONV (postoperative nausea and vomiting)   . RBBB (right bundle branch block)    Old  . Stroke [facility])    mini strokes  . Syncope    likely due to low blood sugar  . Tachycardia    Sinus tachycardia  . Type II diabetes mellitus (HCC)    Type II  . Urinary incontinence   . Venous insufficiency     Past Surgical History:  Procedure Laterality Date  . ABDOMINAL HYSTERECTOMY  1980's  . AMPUTATION Right 02/24/2018   Procedure: RIGHT FOOT GREAT TOE AND 2ND TOE AMPUTATION;  Surgeon: Nadara Mustard, MD;  Location: [facility] OR;  Service: Orthopedics;  Laterality: Right;  . AMPUTATION Right 04/30/2018   Procedure: RIGHT TRANSMETATARSAL AMPUTATION;  Surgeon: Nadara Mustard, MD;  Location: [facility] OR;  Service: Orthopedics;  Laterality: Right;  . CATARACT EXTRACTION, BILATERAL Bilateral ?2013  . COLONOSCOPY W/ POLYPECTOMY    . COLONOSCOPY WITH PROPOFOL N/A 03/13/2019   Procedure: COLONOSCOPY WITH PROPOFOL;  Surgeon: Beverley Fiedler, MD;  Location: [facility] ENDOSCOPY;  Service: Gastroenterology;  Laterality: N/A;  . CORONARY ANGIOPLASTY WITH STENT PLACEMENT  1999; 2007   "1 + 1"  . ESOPHAGOGASTRODUODENOSCOPY (EGD) WITH PROPOFOL N/A 03/13/2019   Procedure: ESOPHAGOGASTRODUODENOSCOPY (EGD) WITH PROPOFOL;  Surgeon: Beverley Fiedler, MD;  Location: [facility] ENDOSCOPY;  Service: Gastroenterology;   Laterality: N/A;  . EYE SURGERY Bilateral    lazer  . HEMOSTASIS CLIP PLACEMENT  03/13/2019   Procedure: HEMOSTASIS CLIP PLACEMENT;  Surgeon: Beverley Fiedler, MD;  Location: [facility] ENDOSCOPY;  Service: Gastroenterology;;  . KNEE ARTHROSCOPY Left 10/25/2006  . POLYPECTOMY  03/13/2019   Procedure: POLYPECTOMY;  Surgeon: Beverley Fiedler, MD;  Location: [facility] ENDOSCOPY;  Service: Gastroenterology;;  . RIGHT HEART CATH N/A 07/24/2017   Procedure: RIGHT HEART CATH;  Surgeon: Dolores Patty, MD;  Location: [facility] INVASIVE CV LAB;  Service: Cardiovascular;  Laterality: N/A;  . RIGHT HEART CATHETERIZATION N/A 09/22/2013   Procedure: RIGHT HEART CATH;  Surgeon: Dolores Patty, MD;  Location: [facility] CATH LAB;  Service: Cardiovascular;  Laterality: N/A;  . SHOULDER ARTHROSCOPY WITH OPEN ROTATOR CUFF REPAIR Right 03/14/2014   Procedure: RIGHT SHOULDER ARTHROSCOPY WITH BICEPS RELEASE, OPEN SUBSCAPULA REPAIR, OPEN SUPRASPINATUS REPAIR.;  Surgeon: Cammy Copa, MD;  Location: [facility] OR;  Service: Orthopedics;  Laterality: Right;  . TOE AMPUTATION Right 02/24/2018   GREAT TOE AND 2ND TOE AMPUTATION  . TUBAL LIGATION  1970's    Social History  reports that she quit smoking about 22 years ago. Her smoking use included cigarettes. She has a 96.00 pack-year smoking history. She has never used smokeless tobacco. She reports previous alcohol use. She reports that she does not use drugs.  Allergies  Allergen Reactions  . Codeine Nausea And Vomiting    Family History  Problem Relation Age of Onset  . Heart attack Mother 76     Prior to Admission medications   Medication Sig Start Date End Date Taking? Authorizing Provider  albuterol (VENTOLIN HFA) 108 (90 Base) MCG/ACT inhaler TAKE 2 PUFFS BY MOUTH EVERY 6 HOURS AS NEEDED FOR WHEEZE OR SHORTNESS OF BREATH Patient taking differently: Inhale 2 puffs into the lungs every 6 (six) hours as needed for wheezing or shortness of breath.  06/27/19  Yes [place], Velna Hatchet, MD    allopurinol (ZYLOPRIM) 100 MG tablet TAKE 1 TABLET BY MOUTH TWICE A DAY Patient taking differently: Take 100 mg by mouth 2 (two) times daily.  08/02/19  Yes Nadara Mustard, MD  Ascorbic Acid (VITAMIN C) 1000 MG tablet Take 1,000 mg by mouth daily.   Yes [provider]  aspirin EC 81 MG tablet Take 81 mg by mouth every morning.    Yes [provider]  carvedilol (COREG) 12.5 MG tablet Take 1 tablet (12.5 mg total) by mouth 2 (two) times daily with a meal. 02/13/20  Yes [place], Velna Hatchet, MD  cholecalciferol (VITAMIN D) 1000 units tablet Take 1,000 Units by mouth daily with supper.    Yes [provider]  clopidogrel (PLAVIX) 75 MG tablet TAKE 1 TABLET BY MOUTH EVERY DAY WITH BREAKFAST Patient taking differently: Take 75 mg by mouth daily.  05/11/19  Yes Clegg, Amy D, NP  colchicine 0.6 MG tablet Take 1 tablet (0.6 mg total) by mouth daily. As needed for gout flare 10/14/18  Yes Nadara Mustard, MD  escitalopram (LEXAPRO) 20 MG tablet TAKE 1 TABLET BY MOUTH EVERY DAY Patient taking differently: Take 20 mg by mouth daily.  05/11/19  Yes Donita Brooks, MD  ferrous sulfate 325 (65 FE) MG tablet Take 1 tablet (325 mg total) by mouth 2 (two) times daily with a meal. Reported on 08/21/2015 06/07/18  Yes [place], Velna Hatchet, MD  HYDROcodone-acetaminophen (NORCO) 5-325 MG tablet Take 1 tablet by mouth every 8 (eight) hours as needed for moderate pain. 11/01/19  Yes [place], Velna Hatchet, MD  Insulin Disposable Pump (OMNIPOD DASH 5 PACK PODS) MISC Inject into the skin See admin instructions. Use continuously with Novolin R - change every 72 hours. 11/18/18  Yes [provider]  insulin NPH Human (HUMULIN N,NOVOLIN N) 100 UNIT/ML injection Inject 10-15 Units into the skin See admin instructions. Inject 15 units subcutaneously in the morning if CBG >300; inject 10 units 200-300   Yes [provider]  insulin regular (NOVOLIN R) 100 units/mL injection Inject into the skin See  admin instructions. Manually add bolus to continuous dose via OmniPod 3 times daily per sliding scale (CBG 80-150 7 units, 151-200 9 units, 201-250 12 units, 251-300 14 units, 301- 400 17 units)   Yes [provider]  levothyroxine (SYNTHROID, LEVOTHROID) 50 MCG tablet Take 1 tablet (50 mcg total) by mouth daily before breakfast. 11/07/16  Yes Allayne Butcher B, PA-C  magnesium oxide (MAG-OX)  400 MG tablet TAKE 1 TABLET BY MOUTH EVERY DAY Patient taking differently: Take 400 mg by mouth daily.  09/13/19  Yes [place], Velna Hatchet, MD  nitroGLYCERIN (NITROSTAT) 0.4 MG SL tablet Place 1 tablet (0.4 mg total) under the tongue every 5 (five) minutes as needed for chest pain. Patient taking differently: Place 0.4 mg under the tongue every 5 (five) minutes as needed for chest pain (max 3 doses).  04/28/19  Yes Clegg, Amy D, NP  ondansetron (ZOFRAN-ODT) 8 MG disintegrating tablet Take 1 tablet (8 mg total) by mouth 3 (three) times daily. Patient taking differently: Take 8 mg by mouth 3 (three) times daily as needed for nausea or vomiting.  02/27/20  Yes [place], Velna Hatchet, MD  pantoprazole (PROTONIX) 40 MG tablet TAKE 1 TABLET BY MOUTH EVERY DAY Patient taking differently: Take 40 mg by mouth daily.  01/04/20  Yes [place], Velna Hatchet, MD  pregabalin (LYRICA) 150 MG capsule Take 1 capsule (150 mg total) by mouth 2 (two) times daily. 11/12/16  Yes [place], Velna Hatchet, MD  torsemide (DEMADEX) 20 MG tablet Take 2 tablets (40 mg total) by mouth 2 (two) times daily. Patient taking differently: Take 60 mg by mouth 2 (two) times daily.  02/10/20  Yes [place], Melina Schools, MD  traZODone (DESYREL) 100 MG tablet TAKE 1 AND 1/2 TABLETS BY MOUTH AT BEDTIME AS NEEDED FOR SLEEP. Patient taking differently: Take 100 mg by mouth at bedtime.  01/26/20  Yes [place], Velna Hatchet, MD  ursodiol (ACTIGALL) 500 MG tablet Take 500 mg by mouth 3 (three) times daily. 09/09/19  Yes [provider]  vitamin E (VITAMIN E) 1000 UNIT capsule Take 1,000  Units by mouth daily with supper.    Yes [provider]  loperamide (IMODIUM) 2 MG capsule Take 1 capsule (2 mg total) by mouth every 8 (eight) hours. Patient not taking: Reported on 03/07/2020 02/10/20   Lorin Glass, MD    Physical Exam: Vitals:   03/07/20 1800 03/07/20 1815 03/07/20 1830 03/07/20 2015  BP: (!) 148/68 (!) 152/64 (!) 148/69 (!) 137/119  Pulse: 89 86 86 83  Resp: 12 13 11 19   Temp:      TempSrc:      SpO2: 98% 99% 99% 100%  Weight:      Height:        Constitutional: NAD, calm, comfortable Vitals:   03/07/20 1800 03/07/20 1815 03/07/20 1830 03/07/20 2015  BP: (!) 148/68 (!) 152/64 (!) 148/69 (!) 137/119  Pulse: 89 86 86 83  Resp: 12 13 11 19   Temp:      TempSrc:      SpO2: 98% 99% 99% 100%  Weight:      Height:       General: WDWN, Alert and oriented x3.  Eyes: EOMI, PERRL, lids and conjunctivae normal.  Sclera nonicteric HENT:  [place]/AT, external ears normal.  Nares patent without epistasis.  Mucous membranes are moist. Posterior pharynx clear of any exudate or lesions. Neck: Soft, normal range of motion, supple, no masses, no thyromegaly.  Trachea midline Respiratory: Equal but diminished breath sounds.  Mild diffuse Rales.  No rhonchi. no wheezing, no crackles. Normal respiratory effort. No accessory muscle use.  Cardiovascular: Regular rate and rhythm, no murmurs / rubs / gallops. +2 lower extremity edema.  Trace pedal pulses.  Abdomen: Soft, no tenderness, nondistended, obese.  No rebound or guarding.  No masses palpated. Bowel sounds normoactive Musculoskeletal: FROM. no clubbing / cyanosis. No joint deformity upper and lower  extremities. Normal muscle tone.  Skin: Warm, dry, intact no rashes, lesions, ulcers. No induration.  Mild weeping from right upper thigh.  No palpable cords Neurologic: CN 2-12 grossly intact.  Normal speech.  Sensation intact, patella DTR +1 bilaterally. Strength 5/5 in all extremities.   Psychiatric: Normal judgment and  insight.  Normal mood.    Labs on Admission: I have personally reviewed following labs and imaging studies  CBC: Recent Labs  Lab 03/07/20 1816  WBC 5.4  NEUTROABS 4.7  HGB 9.8*  HCT 32.7*  MCV 87.2  PLT 195    Basic Metabolic Panel: Recent Labs  Lab 03/07/20 1816  NA 137  K 3.6  CL 99  CO2 27  GLUCOSE 164*  BUN 49*  CREATININE 1.88*  CALCIUM 8.7*    GFR: Estimated Creatinine Clearance: 37.2 mL/min (A) (by C-G formula based on SCr of 1.88 mg/dL (H)).  Liver Function Tests: No results for input(s): AST, ALT, ALKPHOS, BILITOT, PROT, ALBUMIN in the last 168 hours.  Urine analysis:    Component Value Date/Time   COLORURINE YELLOW 02/07/2020 1508   APPEARANCEUR CLEAR 02/07/2020 1508   LABSPEC 1.015 02/07/2020 1508   PHURINE 5.0 02/07/2020 1508   GLUCOSEU >=500 (A) 02/07/2020 1508   HGBUR MODERATE (A) 02/07/2020 1508   BILIRUBINUR NEGATIVE 02/07/2020 1508   KETONESUR 80 (A) 02/07/2020 1508   PROTEINUR 100 (A) 02/07/2020 1508   UROBILINOGEN 0.2 04/01/2014 1659   NITRITE NEGATIVE 02/07/2020 1508   LEUKOCYTESUR SMALL (A) 02/07/2020 1508    Radiological Exams on Admission: DG Chest Portable 1 View  Result Date: 03/07/2020 CLINICAL DATA:  69 year old female with shortness of breath. EXAM: PORTABLE CHEST 1 VIEW COMPARISON:  Chest radiographs 02/06/2020 and earlier. FINDINGS: Portable AP semi upright view at 1721 hours. Stable lung volumes and mediastinal contours. Symmetric increased pulmonary interstitial markings since last month. Pulmonary vasculature appears indistinct. No pneumothorax, consolidation or pleural effusion. Paucity of bowel gas in the upper abdomen. No acute osseous abnormality identified. IMPRESSION: Bilateral increased pulmonary interstitial markings since last month. Favor interstitial edema over viral/atypical respiratory infection. Electronically Signed   By: Odessa Fleming M.D.   On: 03/07/2020 17:39    EKG: Independently reviewed.  Normal sinus  rhythm with right bundle branch block.  QTc 466.  No acute ST elevation or depression.  Assessment/Plan Principal Problem:   Pneumonia due to COVID-19 virus Ms. [place] is admitted to medical telemetry floor on COVID-19 precautions and protocols. She is on supplemental oxygen by nasal cannula to maintain O2 sat between 92-96% She is started on remdesivir in the emergency room.  Started on Solu-Medrol COVID-19 protocol Monitor daily labs with CBC, CMP, CRP, D-dimer Albuterol MDI every 4 hours as needed for cough, shortness of breath, wheeze  Active Problems:   Acute on chronic diastolic CHF (congestive heart failure) (HCC) Will be diuresed with Lasix twice a day.  Monitor I&O's and daily weight. Patient had echocardiogram in September of this year and results were reviewed in chart.  Patient had preserved ejection fraction at that time.  BNP level today is elevated over baseline and chest x-ray shows pulmonary edema Continue beta-blocker therapy with Coreg    Hypoxia Submental oxygen as above.  RT to follow.  Incentive spirometer every hour while awake.    Essential hypertension New home medication of Coreg    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 mellitus (HCC)  Patient with chronic kidney disease stage IV that is stable.  Will monitor electrolytes renal  function with labs in morning    Type 2 diabetes mellitus with stage 4 chronic kidney disease (HCC) Monitor blood sugars before every meal and at bedtime.  Sliding scale insulin provide as needed for glycemic control. Patient reports having a hemoglobin A1c 2 weeks ago with her PCP and the value was 11    CAD (coronary artery disease) Chronic and stable    RBBB (right bundle branch block) Chronic     Obesity, Class III, BMI 40-49.9 (morbid obesity) (HCC) Follow-up with PCP after discharge for lifestyle and dietary interventions for weight loss    DVT prophylaxis: Patient placed on heparin infusion for full anticoagulation  overnight with elevated D-dimer and COVID-19.  PE cannot be ruled out tonight with CT angiography of chest secondary to CKD stage IV.  VQ scan ordered for morning. Code Status:   Full code Family Communication:  The diagnosis and plan discussed with patient.  Patient verbalized understanding and agrees with plan.  Questions answered.  Further recommendation to follow as clinically indicated. Disposition Plan:   Patient is from:  Home  Anticipated DC to:  Home  Anticipated DC date:  Anticipate greater than 2 midnight stay in the hospital to treat acute medical condition  Anticipated DC barriers: No barriers to discharge identified at this time   Admission status:  Inpatient  Severity of Illness: The appropriate patient status for this patient is INPATIENT. Inpatient status is judged to be reasonable and necessary in order to provide the required intensity of service to ensure the patient's safety. The patient's presenting symptoms, physical exam findings, and initial radiographic and laboratory data in the context of their chronic comorbidities is felt to place them at high risk for further clinical deterioration. Furthermore, it is not anticipated that the patient will be medically stable for discharge from the hospital within 2 midnights of admission. The following factors support the patient status of inpatient.   * I certify that at the point of admission it is my clinical judgment that the patient will require inpatient hospital care spanning beyond 2 midnights from the point of admission due to high intensity of service, high risk for further deterioration and high frequency of surveillance required.Claudean Severance Deamber Buckhalter MD [facility]  How to contact the [facility] Attending or Consulting provider 7A - 7P or covering provider during after hours 7P -7A, for this patient?   1. Check the care team in [facility] and look for a) attending/consulting [facility] provider listed and b) the [facility] team  listed 2. Log into [email or web address] and use [place]'s universal password to access. If you do not have the password, please contact the hospital operator. 3. Locate the [facility] provider you are looking for under [facility] and page to a number that you can be directly reached. 4. If you still have difficulty reaching the provider, please page the [facility] (Director on Call) for the Hospitalists listed on amion for assistance.  03/07/2020, 8:58 PM

## 2020-03-07 NOTE — ED Notes (Signed)
1st temp normal, 2nd temp 102.6, 3rd consecutive temp 100.6. getting another thermometer for measurement.

## 2020-03-07 NOTE — Progress Notes (Signed)
Subjective:    Patient ID: Susan [name], female    DOB: [birth year], 69 y.o.   MRN: [id number]  Patient presents for Illness (saturday- N/V, coughing blood, weakness, loose stools)  Patient here with her husband.  She has very complicated past medical history including diabetes mellitus type 2 with recent admission for DKA diabetes is uncontrolled.  She has CKD stage IV.  Peripheral vascular disease.  She has chronic respiratory failure she is on 2 L of oxygen at home she typically wears at bedtime.  Diastolic heart failure.  For the past 2 to 3 days she has had increasing weakness she is to call with production she has been coughing up blood.  She has been progressively weaker.  She has been having nausea vomiting and loose stools.  She has not taken any medications in the past 3 days including her old medications or her insulin.  She states that her blood sugar this morning was 245.  She has not been keeping down any food and very minimal liquids.  At home she has been using the 2 L of oxygen but typically only at night.  She declined going to the emergency room despite her husband asking her to.  When she arrived in the office today oxygen saturation was 82% on room air heart rate 113.  She is very pale and a little diaphoretic.  Her blood pressure was elevated 182/84.  She states that she has had some episodes of chest pain intermittently as well.  No known sick contacts within the home.  They have been checking her temperature she has not had any fever that they are aware of.  COVID vaccine UTD    Review Of Systems:  GEN- +fatigue, fever, weight loss,+weakness, recent illness HEENT- denies eye drainage, change in vision, nasal discharge, CVS- denies chest pain, palpitations RESP- +SOB, +cough, wheeze ABD- + N/V, +change in stools, abd pain GU- denies dysuria, hematuria, dribbling, incontinence MSK- denies joint pain, +muscle aches, injury Neuro- denies headache, dizziness, syncope,  seizure activity       Objective:    BP (!) 182/84 (BP Location: Right Arm, Patient Position: Sitting, Cuff Size: Large)   Pulse (!) 114   Temp (!) 100.6 F (38.1 C)   Resp (!) 24   SpO2 (!) 88% Comment: 2L/ min via [place] GEN- NAD, alert and oriented x 3,somnolent appearing, oxygen at  91% on 4L ,sitting in wheelchair  HEENT- PERRL, EOMI, non injected sclera, pink conjunctiva, Dry MM  oropharynx clear Neck- Supple, no LAD  CVS- tachycardic , no murmur RESP- congestion bilat, no wheeze, decreased BS at bases  ABD-NABS,soft,NT,ND EXT- 1+ pitting edema with venous stasis changes ,Pulses- Radial palpated   EKG- Sinus tachycardia , multiple PVC  CBG 224       Assessment & Plan:      Problem List Items Addressed This Visit      Unprioritized   Acute respiratory failure (HCC)    Pt with recurrent resporatry failure but with multiple factors at play New cough with hemotpysis concerning for new possible PNA other infectious etiology  She is on plavix, but PE also possibility with recent admission for DKA and decreased mobility   CHF also of concern with her edema and symptoms She has not had any meds and BP quite elevated in the office  DKA a few weeks ago, CBG looks okay despite not taking insulin  But expect renal function as worsened with the GI losses  EKG  shows sinus tachycardia/SVT with no ST elevation or significant depression, compared to previous  Sat 92% on 4L of oxygen   EMS called pt to be transported to the hospital, pt husband at bedside    EMS on scene, BP systolic now  563'S  continued tachycardia but sat stable on 4L  Sepsis alert sent via EMS to hospital       CAD (coronary artery disease) (Chronic)   Chest pain   Chronic diastolic CHF (congestive heart failure) (HCC) (Chronic)   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 mellitus ([place])   Diabetes mellitus type II, uncontrolled ([place])    Other Visit Diagnoses    Tachycardia    -  Primary   Relevant Orders    EKG 12-Lead (Completed)   EKG 12-Lead (Completed)   Hemoptysis       Dehydration       Nausea and vomiting, intractability of vomiting not specified, unspecified vomiting type          Note: This dictation was prepared with Dragon dictation along with smaller phrase technology. Any transcriptional errors that result from this process are unintentional.

## 2020-03-07 NOTE — ED Provider Notes (Signed)
[facility] EMERGENCY DEPARTMENT Provider Note   CSN: [id number] Arrival date & time: 03/07/20  1654     History Chief Complaint  Patient presents with  . Shortness of Breath    Susan [name] is a 69 y.o. female with a past medical history of CHF, hypertension, diabetes presenting to the ED with a chief complaint of shortness of breath, chest pain, generalized weakness, vomiting and fevers.  Symptoms began approximately 5 days ago.  She was seen and evaluated at PCPs office, found to be hypoxic to 82% with improvement on 4 L of oxygen via nasal cannula.  Found to be febrile as well.  States that she was unable to tolerate any of her home medications since her symptoms began due to her vomiting.  No sick contacts with similar symptoms.  Reports generalized abdominal discomfort and chest pain as well.  She has had bilateral lower extremity edema but feels like the left leg is worse.  She denies history of DVT or PE.  She takes Plavix.  States that she is coughing up mucus and blood.  HPI     Past Medical History:  Diagnosis Date  . Acute MI ([place]) 1999; 2007  . Anemia    hx  . Anginal pain ([place])   . Anxiety   . ARF (acute renal failure) ([place]) 06/2017   [facility]  . Arthritis    "generalized" (03/15/2014)  . CAD (coronary artery disease)    MI in 2000 - MI  2007 - treated bare metal stent (no nuclear since then as 9/11)  . Carotid artery disease ([place])   . CHF (congestive heart failure) ([place])   . Chronic diastolic heart failure (HCC)    a) ECHO (08/2013) EF 55-60% and RV function nl b) RHC (08/2013) RA 4, RV 30/5/7, PA 25/10 (16), PCWP 7, Fick CO/CI 6.3/2.7, PVR 1.5 WU, PA 61 and 66%  . Daily headache    "~ every other day; since I fell in June" (03/15/2014)  . Depression   . Dyslipidemia   . Dyspnea   . Exertional shortness of breath   . HTN (hypertension)   . Hypothyroidism   . Neuropathy   . Obesity   . Osteoarthritis   . Peripheral  neuropathy   . PONV (postoperative nausea and vomiting)   . RBBB (right bundle branch block)    Old  . Stroke [facility])    mini strokes  . Syncope    likely due to low blood sugar  . Tachycardia    Sinus tachycardia  . Type II diabetes mellitus (HCC)    Type II  . Urinary incontinence   . Venous insufficiency     Patient Active Problem List   Diagnosis Date Noted  . Acute respiratory failure ([place]) 02/24/2020  . Enteritis, enteropathogenic E. coli 02/10/2020  . DKA (diabetic ketoacidoses) 02/07/2020  . Common bile duct (CBD) obstruction 05/28/2019  . Benign neoplasm of ascending colon   . Benign neoplasm of transverse colon   . Benign neoplasm of descending colon   . Benign neoplasm of sigmoid colon   . Gastric polyps   . Hyperkalemia 03/11/2019  . Prolonged QT interval 03/11/2019  . Onychomycosis 06/21/2018  . Pressure injury of skin 03/19/2018  . Syncope 03/18/2018  . Osteomyelitis of second toe of right foot ([place])   . Toe osteomyelitis, right ([place])   . 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 ([place])   . PVD (peripheral vascular  disease) ([place]) 10/26/2017  . Hypothyroid 07/27/2017  . PAH (pulmonary artery hypertension) ([place])   . Impaired ambulation 07/19/2017  . Leg cramps 02/27/2017  . Peripheral edema 01/12/2017  . Diabetic neuropathy ([place]) 11/12/2016  . 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 mellitus ([place]) 10/24/2015  . Anemia 10/03/2015  . Generalized anxiety disorder 10/03/2015  . Insomnia 10/03/2015  . Chest pain   . Gastritis 06/07/2015  . Diabetes mellitus type II, uncontrolled ([place]) 06/07/2015  . Chronic diastolic CHF (congestive heart failure) ([place]) 06/07/2015  . Non compliance with medical treatment 04/17/2014  . Rotator cuff tear 03/14/2014  . Class 3 obesity 09/23/2013  . Acute kidney injury superimposed on chronic kidney disease ([place]) 12/25/2012  . Hypotension 12/25/2012  . Urinary incontinence   . MDD (major depressive disorder)  11/12/2010  . RBBB (right bundle branch block)   . CAD (coronary artery disease)   . Hyperlipemia 01/22/2009  . Essential hypertension 01/22/2009    Past Surgical History:  Procedure Laterality Date  . ABDOMINAL HYSTERECTOMY  1980's  . AMPUTATION Right 02/24/2018   Procedure: RIGHT FOOT GREAT TOE AND 2ND TOE AMPUTATION;  Surgeon: Newt Minion, MD;  Location: [facility];  Service: Orthopedics;  Laterality: Right;  . AMPUTATION Right 04/30/2018   Procedure: RIGHT TRANSMETATARSAL AMPUTATION;  Surgeon: Newt Minion, MD;  Location: [facility];  Service: Orthopedics;  Laterality: Right;  . CATARACT EXTRACTION, BILATERAL Bilateral ?2013  . COLONOSCOPY W/ POLYPECTOMY    . COLONOSCOPY WITH PROPOFOL N/A 03/13/2019   Procedure: COLONOSCOPY WITH PROPOFOL;  Surgeon: Jerene Bears, MD;  Location: [facility];  Service: Gastroenterology;  Laterality: N/A;  . [place]; 2007   "1 + 1"  . ESOPHAGOGASTRODUODENOSCOPY (EGD) WITH PROPOFOL N/A 03/13/2019   Procedure: ESOPHAGOGASTRODUODENOSCOPY (EGD) WITH PROPOFOL;  Surgeon: Jerene Bears, MD;  Location: [facility] ENDOSCOPY;  Service: Gastroenterology;  Laterality: N/A;  . EYE SURGERY Bilateral    [place]  . HEMOSTASIS CLIP PLACEMENT  03/13/2019   Procedure: HEMOSTASIS CLIP PLACEMENT;  Surgeon: Jerene Bears, MD;  Location: [facility] ENDOSCOPY;  Service: Gastroenterology;;  . KNEE ARTHROSCOPY Left 10/25/2006  . POLYPECTOMY  03/13/2019   Procedure: POLYPECTOMY;  Surgeon: Jerene Bears, MD;  Location: [facility];  Service: Gastroenterology;;  . RIGHT HEART CATH N/A 07/24/2017   Procedure: RIGHT HEART CATH;  Surgeon: Jolaine Artist, MD;  Location: [facility] CV LAB;  Service: Cardiovascular;  Laterality: N/A;  . RIGHT HEART CATHETERIZATION N/A 09/22/2013   Procedure: RIGHT HEART CATH;  Surgeon: Jolaine Artist, MD;  Location: [facility] CATH LAB;  Service: Cardiovascular;  Laterality: N/A;  . SHOULDER ARTHROSCOPY WITH OPEN ROTATOR CUFF REPAIR  Right 03/14/2014   Procedure: RIGHT SHOULDER ARTHROSCOPY WITH BICEPS RELEASE, OPEN SUBSCAPULA REPAIR, OPEN SUPRASPINATUS REPAIR.;  Surgeon: Meredith Pel, MD;  Location: [facility];  Service: Orthopedics;  Laterality: Right;  . TOE AMPUTATION Right 02/24/2018   GREAT TOE AND 2ND TOE AMPUTATION  . TUBAL LIGATION  1970's     OB History   No obstetric history on file.     Family History  Problem Relation Age of Onset  . Heart attack Mother 78    Social History   Tobacco Use  . Smoking status: Former Smoker    Packs/day: 3.00    Years: 32.00    Pack years: 96.00    Types: Cigarettes    Quit date: 10/24/1997    Years since quitting: 22.3  . Smokeless tobacco: Never  Used  Vaping Use  . Vaping Use: Never used  Substance Use Topics  . Alcohol use: Not Currently    Comment: "might have 2-3 daiquiris in the summer"  . Drug use: No    Home Medications Prior to Admission medications   Medication Sig Start Date End Date Taking? Authorizing Provider  albuterol (VENTOLIN HFA) 108 (90 Base) MCG/ACT inhaler TAKE 2 PUFFS BY MOUTH EVERY 6 HOURS AS NEEDED FOR WHEEZE OR SHORTNESS OF BREATH Patient taking differently: Inhale 2 puffs into the lungs every 6 (six) hours as needed for wheezing or shortness of breath.  06/27/19   [place], [name], MD  allopurinol (ZYLOPRIM) 100 MG tablet TAKE 1 TABLET BY MOUTH TWICE A DAY Patient taking differently: Take 100 mg by mouth 2 (two) times daily.  08/02/19   Newt Minion, MD  Ascorbic Acid (VITAMIN C) 1000 MG tablet Take 1,000 mg by mouth daily.    [provider]  aspirin EC 81 MG tablet Take 81 mg by mouth every morning.     [provider]  carvedilol (COREG) 12.5 MG tablet Take 1 tablet (12.5 mg total) by mouth 2 (two) times daily with a meal. 02/13/20   [place], [name], MD  cholecalciferol (VITAMIN D) 1000 units tablet Take 1,000 Units by mouth daily with supper.     [provider]  clopidogrel (PLAVIX) 75 MG tablet TAKE  1 TABLET BY MOUTH EVERY DAY WITH BREAKFAST Patient taking differently: Take 75 mg by mouth daily.  05/11/19   Clegg, Amy D, NP  colchicine 0.6 MG tablet Take 1 tablet (0.6 mg total) by mouth daily. As needed for gout flare 10/14/18   Newt Minion, MD  escitalopram (LEXAPRO) 20 MG tablet TAKE 1 TABLET BY MOUTH EVERY DAY Patient taking differently: Take 20 mg by mouth daily.  05/11/19   [name], MD  ferrous sulfate 325 (65 FE) MG tablet Take 1 tablet (325 mg total) by mouth 2 (two) times daily with a meal. Reported on 08/21/2015 06/07/18   Alycia Rossetti, MD  HYDROcodone-acetaminophen (NORCO) 5-325 MG tablet Take 1 tablet by mouth every 8 (eight) hours as needed for moderate pain. 11/01/19   Alycia Rossetti, MD  Insulin Disposable Pump (OMNIPOD DASH 5 PACK PODS) MISC Inject into the skin. Use continuously with Novolin R - change every 72 hours. 11/18/18   [provider]  insulin NPH Human (HUMULIN N,NOVOLIN N) 100 UNIT/ML injection Inject 20 Units into the skin See admin instructions. Inject 20 units subcutaneously in the morning if CBG >200    [provider]  insulin regular (NOVOLIN R) 100 units/mL injection Inject 7-17 Units into the skin 3 (three) times daily before meals. Manually add bolus to continuous dose via OmniPod 3 times daily per sliding scale: CBG 80-150 7 units, 151-200 9 units, 201-250 12 units, 251-300 14 units, 301-400 17 units.    [provider]  levothyroxine (SYNTHROID, LEVOTHROID) 50 MCG tablet Take 1 tablet (50 mcg total) by mouth daily before breakfast. 11/07/16   [name], PA-C  loperamide (IMODIUM) 2 MG capsule Take 1 capsule (2 mg total) by mouth every 8 (eight) hours. 02/10/20   Terrilee Croak, MD  magnesium oxide (MAG-OX) 400 MG tablet TAKE 1 TABLET BY MOUTH EVERY DAY 09/13/19   Alycia Rossetti, MD  Multiple Vitamin (MULTIVITAMIN WITH MINERALS) TABS tablet Take 1 tablet by mouth daily.    [provider]  nitroGLYCERIN  (NITROSTAT) 0.4 MG  SL tablet Place 1 tablet (0.4 mg total) under the tongue every 5 (five) minutes as needed for chest pain. 04/28/19   Clegg, Amy D, NP  ondansetron (ZOFRAN-ODT) 8 MG disintegrating tablet Take 1 tablet (8 mg total) by mouth 3 (three) times daily. 02/27/20   [place], [name], MD  pantoprazole (PROTONIX) 40 MG tablet TAKE 1 TABLET BY MOUTH EVERY DAY Patient taking differently: Take 40 mg by mouth daily.  01/04/20   [place], [name], MD  pregabalin (LYRICA) 150 MG capsule Take 1 capsule (150 mg total) by mouth 2 (two) times daily. 11/12/16   Alycia Rossetti, MD  torsemide (DEMADEX) 20 MG tablet Take 2 tablets (40 mg total) by mouth 2 (two) times daily. 02/10/20   [place], Marlowe Aschoff, MD  traZODone (DESYREL) 100 MG tablet TAKE 1 AND 1/2 TABLETS BY MOUTH AT BEDTIME AS NEEDED FOR SLEEP. Patient taking differently: Take 150 mg by mouth at bedtime as needed for sleep.  01/26/20   Alycia Rossetti, MD  ursodiol (ACTIGALL) 500 MG tablet Take 500 mg by mouth 3 (three) times daily. 09/09/19   [provider]  vitamin E (VITAMIN E) 1000 UNIT capsule Take 1,000 Units by mouth daily with supper.     [provider]    Allergies    Codeine  Review of Systems   Review of Systems  Constitutional: Positive for appetite change and fever. Negative for chills.  HENT: Negative for ear pain, rhinorrhea, sneezing and sore throat.   Eyes: Negative for photophobia and visual disturbance.  Respiratory: Positive for cough, chest tightness and shortness of breath. Negative for wheezing.   Cardiovascular: Positive for chest pain. Negative for palpitations.  Gastrointestinal: Positive for abdominal pain, diarrhea, nausea and vomiting. Negative for blood in stool and constipation.  Genitourinary: Negative for dysuria, hematuria and urgency.  Musculoskeletal: Negative for myalgias.  Skin: Negative for rash.  Neurological: Negative for dizziness, weakness and light-headedness.    Physical  Exam Updated Vital Signs BP (!) 148/69   Pulse 86   Temp (!) 103 F (39.4 C) (Rectal)   Resp 11   Ht 5\' 6"  (1.676 m)   Wt 122.5 kg   SpO2 99%   BMI 43.58 kg/m   Physical Exam Vitals and nursing note reviewed.  Constitutional:      General: She is not in acute distress.    Appearance: She is well-developed. She is obese.     Comments: On 4 L of oxygen via nasal cannula.  HENT:     Head: Normocephalic and atraumatic.     Nose: Nose normal.  Eyes:     General: No scleral icterus.       Left eye: No discharge.     Conjunctiva/sclera: Conjunctivae normal.  Cardiovascular:     Rate and Rhythm: Normal rate and regular rhythm.     Heart sounds: Normal heart sounds. No murmur heard.  No friction rub. No gallop.   Pulmonary:     Effort: Pulmonary effort is normal. Tachypnea present. No respiratory distress.     Breath sounds: Normal breath sounds.     Comments: Crackles in bilateral lungs. Abdominal:     General: Bowel sounds are normal. There is no distension.     Palpations: Abdomen is soft.     Tenderness: There is generalized abdominal tenderness. There is no guarding.  Musculoskeletal:        General: Normal range of motion.     Cervical back: Normal range of motion and  neck supple.     Right lower leg: Edema present.     Left lower leg: Edema present.  Skin:    General: Skin is warm and dry.     Findings: No rash.  Neurological:     Mental Status: She is alert.     Motor: No abnormal muscle tone.     Coordination: Coordination normal.     ED Results / Procedures / Treatments   Labs (all labs ordered are listed, but only abnormal results are displayed) Labs Reviewed  CBG MONITORING, ED - Abnormal; Notable for the following components:      Result Value   Glucose-Capillary 147 (*)    All other components within normal limits  RESPIRATORY PANEL BY RT PCR (FLU A&B, COVID)  CULTURE, BLOOD (ROUTINE X 2)  CULTURE, BLOOD (ROUTINE X 2)  BASIC METABOLIC PANEL  CBC  WITH DIFFERENTIAL/PLATELET  BRAIN NATRIURETIC PEPTIDE  LACTIC ACID, PLASMA  LACTIC ACID, PLASMA  D-DIMER, QUANTITATIVE (NOT AT [facility])  PROCALCITONIN  LACTATE DEHYDROGENASE  FERRITIN  TRIGLYCERIDES  FIBRINOGEN  C-REACTIVE PROTEIN  LACTATE DEHYDROGENASE  PROCALCITONIN    EKG EKG Interpretation  Date/Time:  Wednesday March 07 2020 16:55:58 EDT Ventricular Rate:  94 PR Interval:    QRS Duration: 122 QT Interval:  372 QTC Calculation: 466 R Axis:   -62 Text Interpretation: Sinus rhythm Right bundle branch block Inferior infarct, old Anterior infarct, old no sig change from previous Confirmed by [name] [id number]) on 03/07/2020 6:04:30 PM   Radiology DG Chest Portable 1 View  Result Date: 03/07/2020 CLINICAL DATA:  69 year old female with shortness of breath. EXAM: PORTABLE CHEST 1 VIEW COMPARISON:  Chest radiographs 02/06/2020 and earlier. FINDINGS: Portable AP semi upright view at 1721 hours. Stable lung volumes and mediastinal contours. Symmetric increased pulmonary interstitial markings since last month. Pulmonary vasculature appears indistinct. No pneumothorax, consolidation or pleural effusion. Paucity of bowel gas in the upper abdomen. No acute osseous abnormality identified. IMPRESSION: Bilateral increased pulmonary interstitial markings since last month. Favor interstitial edema over viral/atypical respiratory infection. Electronically Signed   By: Genevie Ann M.D.   On: 03/07/2020 17:39    Procedures .Critical Care Performed by: Delia Heady, PA-C Authorized by: Delia Heady, PA-C   Critical care provider statement:    Critical care time (minutes):  35   Critical care was necessary to treat or prevent imminent or life-threatening deterioration of the following conditions:  Circulatory failure, respiratory failure and sepsis   Critical care was time spent personally by me on the following activities:  Development of treatment plan with patient or surrogate, obtaining  history from patient or surrogate, examination of patient, ordering and performing treatments and interventions, ordering and review of radiographic studies, pulse oximetry, re-evaluation of patient's condition and review of old charts   I assumed direction of critical care for this patient from another provider in my specialty: no     (including critical care time)  Medications Ordered in ED Medications  cefTRIAXone (ROCEPHIN) 1 g in sodium chloride 0.9 % 100 mL IVPB (1 g Intravenous New Bag/Given 03/07/20 1833)  azithromycin (ZITHROMAX) 500 mg in sodium chloride 0.9 % 250 mL IVPB (has no administration in time range)  acetaminophen (TYLENOL) tablet 650 mg (650 mg Oral Given 03/07/20 1752)    ED Course  I have reviewed the triage vital signs and the nursing notes.  Pertinent labs & imaging results that were available during my care of the patient were reviewed by me and considered in  my medical decision making (see chart for details).  Clinical Course as of Mar 07 1842  Wed Mar 07, 2020  1753 Rectal temp.  Temp(!): 103 F (39.4 C) [HK]  1753 Shows edema versus atypical infection.  DG Chest Portable 1 View [HK]    Clinical Course User Index [HK] Delia Heady, [place]   MDM Rules/Calculators/A&P                          Marylin Lathon [facility] was evaluated in Emergency Department on 03/07/20 for the symptoms described in the history of present illness. He/she was evaluated in the context of the global COVID-19 pandemic, which necessitated consideration that the patient might be at risk for infection with the SARS-CoV-2 virus that causes COVID-19. Institutional protocols and algorithms that pertain to the evaluation of patients at risk for COVID-19 are in a state of rapid change based on information released by regulatory bodies including the CDC and federal and state organizations. These policies and algorithms were followed during the patient's care in the ED.  69 year old female with a  past medical history of CHF, hypertension and diabetes presenting to the ED with a chief complaint of shortness of breath, chest pain, generalized weakness, vomiting and fevers.  Symptoms began approximately 5 days ago.  Seen evaluated PCPs office, found to be hypoxic to the 80s with improvement with nasal cannula.  Sent to the ER for the symptoms.  She denies sick contacts with similar symptoms.  Has not taken her medications since she began feeling ill 5 days ago.  She has been fully vaccinated against Covid back in April.  Reports bilateral lower extremity edema but feels like the left leg is worse.  On exam there is bilateral lower extremity edema without significant tenderness.  She is wearing compression stockings.  Has crackles in bilateral lung fields.  On arrival rectal temp is 103.  Her blood pressure is stable at the 790 systolic.  She is wearing 4 L of oxygen via nasal cannula with oxygen saturations above 98%.  Question whether she is fluid overloaded, I do not see any indication to begin IV fluids at this time as she is not hypotensive.  Chest x-ray shows possible edema versus infection.  She may very well still have Covid even though she is immunized however will begin her on antibiotics for community-acquired pneumonia as this is also on the differential.  Lab work including Covid inflammatory markers, blood cultures, Covid testing is pending at this time. Discussed with attending physician, Dr. Johnney Killian.  Care handed off to oncoming provider pending remainder of workup. Anticipate admission for hypoxia in the setting of bacterial vs viral respiratory illness.   All imaging, if done today, including plain films, CT scans, and ultrasounds, independently reviewed by me, and interpretations confirmed via formal radiology reads.  Portions of this note were generated with [employer]. Dictation errors may occur despite best attempts at proofreading.  Final Clinical Impression(s) /  ED Diagnoses Final diagnoses:  Hypoxia    Rx / DC Orders ED Discharge Orders    None       Delia Heady, PA-C 03/07/20 Chrissie Noa, MD 03/07/20 Dan Humphreys    [name], MD 03/07/20 1955

## 2020-03-07 NOTE — Addendum Note (Signed)
Addended by: Sheral Flow on: 03/07/2020 03:53 PM   Modules accepted: Orders

## 2020-03-07 NOTE — ED Notes (Signed)
Pt reporting wrist pain due to iv infiltration, ice pack provided

## 2020-03-08 LAB — CBC
HCT: 33.4 % — ABNORMAL LOW (ref 36.0–46.0)
Hemoglobin: 9.7 g/dL — ABNORMAL LOW (ref 12.0–15.0)
MCH: 25.9 pg — ABNORMAL LOW (ref 26.0–34.0)
MCHC: 29 g/dL — ABNORMAL LOW (ref 30.0–36.0)
MCV: 89.1 fL (ref 80.0–100.0)
Platelets: 180 10*3/uL (ref 150–400)
RBC: 3.75 MIL/uL — ABNORMAL LOW (ref 3.87–5.11)
RDW: 16.1 % — ABNORMAL HIGH (ref 11.5–15.5)
WBC: 3.8 10*3/uL — ABNORMAL LOW (ref 4.0–10.5)
nRBC: 0 % (ref 0.0–0.2)

## 2020-03-08 LAB — COMPREHENSIVE METABOLIC PANEL
ALT: 16 U/L (ref 0–44)
AST: 19 U/L (ref 15–41)
Albumin: 2.6 g/dL — ABNORMAL LOW (ref 3.5–5.0)
Alkaline Phosphatase: 112 U/L (ref 38–126)
Anion gap: 11 (ref 5–15)
BUN: 51 mg/dL — ABNORMAL HIGH (ref 8–23)
CO2: 27 mmol/L (ref 22–32)
Calcium: 8.6 mg/dL — ABNORMAL LOW (ref 8.9–10.3)
Chloride: 101 mmol/L (ref 98–111)
Creatinine, Ser: 1.95 mg/dL — ABNORMAL HIGH (ref 0.44–1.00)
GFR, Estimated: 25 mL/min — ABNORMAL LOW (ref >60)
Glucose, Bld: 233 mg/dL — ABNORMAL HIGH (ref 70–99)
Potassium: 4.7 mmol/L (ref 3.5–5.1)
Sodium: 139 mmol/L (ref 135–145)
Total Bilirubin: 0.6 mg/dL (ref 0.3–1.2)
Total Protein: 5.9 g/dL — ABNORMAL LOW (ref 6.5–8.1)

## 2020-03-08 LAB — URINALYSIS, ROUTINE W REFLEX MICROSCOPIC
Bilirubin Urine: NEGATIVE
Glucose, UA: >=500 mg/dL — AB
Hgb urine dipstick: NEGATIVE
Ketones, ur: NEGATIVE mg/dL
Nitrite: NEGATIVE
Protein, ur: 100 mg/dL — AB
Specific Gravity, Urine: 1.011 (ref 1.005–1.030)
WBC, UA: >50 WBC/hpf — ABNORMAL HIGH (ref 0–5)
pH: 5 (ref 5.0–8.0)

## 2020-03-08 LAB — C-REACTIVE PROTEIN: CRP: 14.3 mg/dL — ABNORMAL HIGH (ref <1.0)

## 2020-03-08 LAB — D-DIMER, QUANTITATIVE: D-Dimer, Quant: 1.72 ug/mL-FEU — ABNORMAL HIGH (ref 0.00–0.50)

## 2020-03-08 LAB — GLUCOSE, CAPILLARY
Glucose-Capillary: 286 mg/dL — ABNORMAL HIGH (ref 70–99)
Glucose-Capillary: 295 mg/dL — ABNORMAL HIGH (ref 70–99)
Glucose-Capillary: 387 mg/dL — ABNORMAL HIGH (ref 70–99)
Glucose-Capillary: 521 mg/dL (ref 70–99)

## 2020-03-08 LAB — HEPARIN LEVEL (UNFRACTIONATED): Heparin Unfractionated: 0.3 IU/mL (ref 0.30–0.70)

## 2020-03-08 MED ORDER — INSULIN GLARGINE 100 UNIT/ML ~~LOC~~ SOLN
15.0000 [IU] | Freq: Every day | SUBCUTANEOUS | Status: DC
  Filled 2020-03-08: qty 0.15

## 2020-03-08 MED ORDER — INSULIN GLARGINE 100 UNIT/ML ~~LOC~~ SOLN
10.0000 [IU] | Freq: Every day | SUBCUTANEOUS | Status: DC
  Administered 2020-03-08: 10 [IU] via SUBCUTANEOUS
  Filled 2020-03-08: qty 0.1

## 2020-03-08 MED ORDER — FERROUS SULFATE 325 (65 FE) MG PO TABS
325.0000 mg | ORAL_TABLET | Freq: Two times a day (BID) | ORAL | Status: DC
  Administered 2020-03-08 – 2020-03-13 (×10): 325 mg via ORAL
  Filled 2020-03-08 (×10): qty 1

## 2020-03-08 MED ORDER — ENOXAPARIN SODIUM 60 MG/0.6ML ~~LOC~~ SOLN
60.0000 mg | SUBCUTANEOUS | Status: DC
  Administered 2020-03-08 – 2020-03-13 (×6): 60 mg via SUBCUTANEOUS
  Filled 2020-03-08 (×6): qty 0.6

## 2020-03-08 MED ORDER — ESCITALOPRAM OXALATE 10 MG PO TABS
20.0000 mg | ORAL_TABLET | Freq: Every day | ORAL | Status: DC
  Administered 2020-03-08 – 2020-03-13 (×6): 20 mg via ORAL
  Filled 2020-03-08 (×3): qty 2
  Filled 2020-03-08 (×2): qty 1
  Filled 2020-03-08: qty 2

## 2020-03-08 MED ORDER — VITAMIN D 25 MCG (1000 UNIT) PO TABS
1000.0000 [IU] | ORAL_TABLET | Freq: Every day | ORAL | Status: DC
  Administered 2020-03-08 – 2020-03-12 (×5): 1000 [IU] via ORAL
  Filled 2020-03-08 (×5): qty 1

## 2020-03-08 MED ORDER — SODIUM CHLORIDE 0.9 % IV SOLN: INTRAVENOUS | Status: DC | PRN

## 2020-03-08 MED ORDER — INSULIN ASPART 100 UNIT/ML ~~LOC~~ SOLN
7.0000 [IU] | Freq: Once | SUBCUTANEOUS | Status: AC
  Administered 2020-03-08: 7 [IU] via SUBCUTANEOUS

## 2020-03-08 NOTE — Progress Notes (Signed)
PROGRESS NOTE    Susan [name] [facility]  WJX:[id number] DOB: Jul 30, 1950 DOA: 03/07/2020 PCP: Salley Scarlet, MD    Brief Narrative:  Susan [name] is a 69 year old female with past medical history notable for chronic diastolic congestive heart failure, essential hypertension, type 2 diabetes mellitus, CKD stage IV, obesity, peripheral vascular disease, chronic neuropathy who presented to the ED via EMS with complaints of progressive shortness of breath, cough, generalized weakness with nausea and vomiting.  Patient reports symptom onset 5 days prior, associated with dyspnea on exertion and nonproductive cough.  Patient was seen by PCP and found to be hypoxic with SPO2 82% on room air.  Patient also reports weight gain over the past several weeks with increased swelling in her lower extremities.  Patient has been vaccine against Covid-19 which were administered in March and April 2021.  No recent sick contacts, no known Covid-19 exposures.  In the ED, Covid-19 PCR positive.  Noted to have an elevated BNP with chest x-ray findings of increased interstitial markings.  TRH consulted for admission for further evaluation and treatment of acute hypoxic restaurant failure secondary to CHF exacerbation and Covid-19 viral pneumonia.   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  Chest x-ray with bilateral increased  interstitial markings consistent with edema versus viral/atypical respiratory infection.  Covid-19 PCR positive.  Patient was hypoxic at PCP office with SPO2 82% on room air. --COVID test: + 03/07/2020 --CRP 10.7>14.3 --ddimer 1.79>1.72 --Remdesivir, plan 5-day course (Day #2/5) --Continue Solumedrol 123mg  IV q12h --prone for 2-3hrs every 12hrs if able --Continue supplemental oxygen, titrate to maintain SPO2 greater than 92%, currently on 3 L nasal cannula with SPO2 96% --Continue supportive care with albuterol MDI prn, vitamin C, zinc, Tylenol, antitussives (benzonatate/ Mucinex/Tussionex) --Follow CBC, CMP, D-dimer, ferritin, and CRP daily --Continue airborne/contact isolation precautions for 3 weeks from the day of diagnosis  The treatment plan and use of medications and known side effects were discussed with patient/family. Some of the medications used are based on case reports/anecdotal data.  All other medications being used in the management of COVID-19 based on limited study data.  Complete risks and long-term side effects are unknown, however in the best clinical judgment they seem to be of some benefit.  Patient wanted to proceed with treatment options provided.  Acute on chronic diastolic congestive heart failure, decompensated Hx essential hypertension Recent TTE 02/08/2020 with LVEF 55-60%, indeterminant diastolic dysfunction previously diagnosed with grade 1 diastolic dysfunction on TTE on 03/19/2018.  BNP elevated 1301 with chest x-ray findings of bilateral increased pulmonary interstitial markings consistent with edema. --Lasix 40 mg IV q12h --Carvedilol 5 mg twice daily --Strict I's and O's and daily weights  Type 2 diabetes mellitus Hemoglobin A1c 11.8 on 12/21/2019, poorly controlled.  On NPH 10-15 units subcutaneously qAM and Novolin R sliding scale. --Repeat hemoglobin A1c in the a.m. --Start Lantus 10 units subcutaneously daily --Resistant  insulin sliding scale for  further coverage --CBGs before every meal/at bedtime  CKD stage IV Creatinine 1.88 on admission, stable. --Avoid nephrotoxins, renally dose all medications --Continue monitor BMP daily in the setting of aggressive IV diuresis as above closely  CAD --Continue aspirin 81 mg p.o. daily, Plavix 75 mg p.o. daily  Iron deficiency anemia --Continue ferrous sulfate 325 mg p.o. twice daily  Anxiety/depression: Continue Lexapro 20 g p.o. daily  Gout: Continue allopurinol 100 mg p.o. twice daily  Hypothyroidism --Levothyroxine 50 mcg p.o. daily  Neuropathy: Lyrica 150 mg p.o. twice daily  Obesity Body mass index is 43.23 kg/m.  Counseled on need for aggressive lifestyle changes/weight loss as this complicates all facets of care.    DVT prophylaxis: Lovenox Code Status: Full code Family Communication: Updated patient extensively at bedside  Disposition Plan:  Status is: Inpatient  Remains inpatient appropriate because:Ongoing diagnostic testing needed not appropriate for outpatient work up, Unsafe d/c plan, IV treatments appropriate due to intensity of illness or inability to take PO and Inpatient level of care appropriate due to severity of illness   Dispo: The patient is from: Home              Anticipated d/c is to: Home              Anticipated d/c date is: 3 days              Patient currently is not medically stable to d/c.   Consultants:   None  Procedures:   None  Antimicrobials:   None   Subjective: Patient seen and examined bedside, resting comfortably.  Sitting at edge of bed.  States breathing has improved slightly since yesterday.  Continues on supplemental oxygen, 3 L per nasal cannula with SPO2 98%.  Continues with lower extremity edema.  No other complaints or concerns at this time.  Denies headache, no dizziness, no chest pain, no palpitations, no abdominal pain, no fever/chills/night sweats, no nausea/vomiting/diarrhea, no abdominal pain, no weakness,  no fatigue, no paresthesias.  No acute events overnight per nursing staff.  Objective: Vitals:   03/08/20 0421 03/08/20 0700 03/08/20 0734 03/08/20 1130  BP: (!) 150/84 136/70 139/74 (!) 126/59  Pulse: 74 73 74 60  Resp: 20 20 19 20   Temp: 97.8 F (36.6 C) 97.9 F (36.6 C) 97.6 F (36.4 C) 97.9 F (36.6 C)  TempSrc: Oral Axillary Oral Axillary  SpO2: 97% 98% 97% 98%  Weight: 121.5 kg     Height: 5\' 6"  (1.676 m)       Intake/Output Summary (Last 24 hours) at 03/08/2020 1434 Last data filed at 03/08/2020 0900 Gross per 24 hour  Intake 1204.48 ml  Output 200 ml  Net 1004.48 ml   Filed Weights   03/07/20 1708 03/08/20 0421  Weight: 122.5 kg 121.5 kg    Examination:  General exam: Appears calm and comfortable  Respiratory system: Clear to auscultation. Respiratory effort normal. Cardiovascular system: S1 & S2 heard, RRR. No JVD, murmurs, rubs, gallops or clicks. No pedal edema. Gastrointestinal system: Abdomen is nondistended, soft and nontender. No organomegaly or masses felt. Normal bowel sounds heard. Central nervous system: Alert and oriented. No focal neurological deficits. Extremities: Symmetric 5 x 5 power. Skin: No rashes, lesions or ulcers Psychiatry: Judgement and insight appear normal. Mood & affect appropriate.     Data Reviewed: I have personally reviewed following labs and imaging studies  CBC: Recent Labs  Lab 03/07/20 1816 03/08/20 0214  WBC 5.4 3.8*  NEUTROABS 4.7  --   HGB 9.8* 9.7*  HCT 32.7* 33.4*  MCV 87.2 89.1  PLT 195 180   Basic Metabolic Panel: Recent Labs  Lab 03/07/20 1816 03/08/20 0214  NA 137 139  K 3.6 4.7  CL 99 101  CO2 27 27  GLUCOSE 164* 233*  BUN 49* 51*  CREATININE 1.88* 1.95*  CALCIUM 8.7* 8.6*   GFR: Estimated Creatinine Clearance: 35.7 mL/min (A) (by C-G formula based on SCr of 1.95 mg/dL (H)). Liver Function Tests: Recent Labs  Lab 03/08/20 0214  AST 19  ALT 16  ALKPHOS 112  BILITOT 0.6  PROT 5.9*    ALBUMIN 2.6*   No results for input(s): LIPASE, AMYLASE in the last 168 hours. No results for input(s): AMMONIA in the last 168 hours. Coagulation Profile: No results for input(s): INR, PROTIME in the last 168 hours. Cardiac Enzymes: No results for input(s): CKTOTAL, CKMB, CKMBINDEX, TROPONINI in the last 168 hours. BNP (last 3 results) No results for input(s): PROBNP in the last 8760 hours. HbA1C: No results for input(s): HGBA1C in the last 72 hours. CBG: Recent Labs  Lab 03/07/20 1811 03/08/20 0726 03/08/20 1128  GLUCAP 147* 286* 295*   Lipid Profile: Recent Labs    03/07/20 1808  TRIG 125   Thyroid Function Tests: No results for input(s): TSH, T4TOTAL, FREET4, T3FREE, THYROIDAB in the last 72 hours. Anemia Panel: Recent Labs    03/07/20 1933  FERRITIN 187   Sepsis Labs: Recent Labs  Lab 03/07/20 1816 03/07/20 1933  PROCALCITON  --  0.31  LATICACIDVEN 1.0 0.9    Recent Results (from the past 240 hour(s))  Respiratory Panel by RT PCR (Flu A&B, Covid) - Nasopharyngeal Swab     Status: Abnormal   Collection Time: 03/07/20  5:01 PM   Specimen: Nasopharyngeal Swab  Result Value Ref Range Status   SARS Coronavirus 2 by RT PCR POSITIVE (A) NEGATIVE Final    Comment: RESULT CALLED TO, READ BACK BY AND VERIFIED WITH: [name] RN 03/07/20 AT 1923 SK (NOTE) SARS-CoV-2 target nucleic acids are DETECTED.  SARS-CoV-2 RNA is generally detectable in upper respiratory specimens  during the acute phase of infection. Positive results are indicative of the presence of the identified virus, but do not rule out bacterial infection or co-infection with other pathogens not detected by the test. Clinical correlation with patient history and other diagnostic information is necessary to determine patient infection status. The expected result is Negative.  Fact Sheet for Patients:  https://www.moore.com/  Fact Sheet for Healthcare  Providers: https://www.young.biz/  This test is not yet approved or cleared by the [place] FDA and  has been authorized for detection and/or diagnosis of SARS-CoV-2 by FDA under an Emergency Use Authorization (EUA).  This EUA will remain in effect (meaning this test can be use d) for the duration of  the COVID-19 declaration under Section 564(b)(1) of the Act, 21 U.S.C. section 360bbb-3(b)(1), unless the authorization is terminated or revoked sooner.      Influenza A by PCR NEGATIVE NEGATIVE Final   Influenza B by PCR NEGATIVE NEGATIVE Final    Comment: (NOTE) The Xpert Xpress SARS-CoV-2/FLU/RSV assay is intended as an aid in  the diagnosis of influenza from Nasopharyngeal swab specimens and  should not be used as a sole basis for treatment. Nasal washings and  aspirates are unacceptable for Xpert Xpress SARS-CoV-2/FLU/RSV  testing.  Fact Sheet for Patients: https://www.moore.com/  Fact Sheet for Healthcare Providers: https://www.young.biz/  This test  is not yet approved or cleared by the [place] and  has been authorized for detection and/or diagnosis of SARS-CoV-2 by  FDA under an Emergency Use Authorization (EUA). This EUA will remain  in effect (meaning this test can be used) for the duration of the  Covid-19 declaration under Section 564(b)(1) of the Act, 21  U.S.C. section 360bbb-3(b)(1), unless the authorization is  terminated or revoked. Performed at [facility], [address] [place], [place] [address]   Blood culture (routine x 2)     Status: None (Preliminary result)   Collection Time: 03/07/20  5:07 PM   Specimen: Site Not Specified; Blood  Result Value Ref Range Status   Specimen Description SITE NOT SPECIFIED  Final   Special Requests   Final    BOTTLES DRAWN AEROBIC AND ANAEROBIC Blood Culture results may not be optimal due to an excessive volume of blood received in culture  bottles   Culture   Final    NO GROWTH < 24 HOURS Performed at [facility], [address], [place], [place] [address]    Report Status PENDING  Incomplete  Blood culture (routine x 2)     Status: None (Preliminary result)   Collection Time: 03/07/20  6:16 PM   Specimen: BLOOD RIGHT HAND  Result Value Ref Range Status   Specimen Description BLOOD RIGHT HAND  Final   Special Requests   Final    BOTTLES DRAWN AEROBIC AND ANAEROBIC Blood Culture adequate volume   Culture   Final    NO GROWTH < 24 HOURS Performed at [facility], [address], [place], [place] [address]    Report Status PENDING  Incomplete         Radiology Studies: DG Chest Portable 1 View  Result Date: 03/07/2020 CLINICAL DATA:  69 year old female with shortness of breath. EXAM: PORTABLE CHEST 1 VIEW COMPARISON:  Chest radiographs 02/06/2020 and earlier. FINDINGS: Portable AP semi upright view at 1721 hours. Stable lung volumes and mediastinal contours. Symmetric increased pulmonary interstitial markings since last month. Pulmonary vasculature appears indistinct. No pneumothorax, consolidation or pleural effusion. Paucity of bowel gas in the upper abdomen. No acute osseous abnormality identified. IMPRESSION: Bilateral increased pulmonary interstitial markings since last month. Favor interstitial edema over viral/atypical respiratory infection. Electronically Signed   By: Odessa Fleming M.D.   On: 03/07/2020 17:39        Scheduled Meds: . allopurinol  100 mg Oral BID  . aspirin EC  81 mg Oral q morning - 10a  . carvedilol  12.5 mg Oral BID WC  . cholecalciferol  1,000 Units Oral Q supper  . clopidogrel  75 mg Oral Daily  . enoxaparin (LOVENOX) injection  60 mg Subcutaneous Q24H  . escitalopram  20 mg Oral Daily  . ferrous sulfate  325 mg Oral BID WC  . furosemide  40 mg Intravenous Q12H  . insulin aspart  0-20 Units Subcutaneous TID WC  . insulin aspart  0-5 Units Subcutaneous QHS  . levothyroxine   50 mcg Oral QAC breakfast  . magnesium oxide  400 mg Oral Daily  . methylPREDNISolone (SOLU-MEDROL) injection  1 mg/kg Intravenous Q12H   Followed by  . [START ON 03/11/2020] predniSONE  50 mg Oral Daily  . pantoprazole  40 mg Oral Daily  . pregabalin  150 mg Oral BID  . traZODone  100 mg Oral QHS   Continuous Infusions: . remdesivir 100 mg in NS 100 mL  100 mg (03/08/20 0900)     LOS: 1 day    Time spent: 39 minutes spent on chart review, discussion with nursing staff, consultants, updating family and interview/physical exam; more than 50% of that time was spent in counseling and/or coordination of care.    Alvira Philips [place], DO [facility] Available via Epic secure chat 7am-7pm After these hours, please refer to coverage provider listed on [email or web address] 03/08/2020, 2:34 PM

## 2020-03-08 NOTE — Progress Notes (Addendum)
[place] for heparin gtt Indication: rule out PE   Allergies  Allergen Reactions  . Codeine Nausea And Vomiting    Patient Measurements: Height: 5\' 6"  (167.6 cm) Weight: 121.5 kg (267 lb 13.7 oz) IBW/kg (Calculated) : 59.3 Heparin Dosing Weight: 88.6kg  Vital Signs: Temp: 97.6 F (36.4 C) (10/14 0734) Temp Source: Oral (10/14 0734) BP: 139/74 (10/14 0734) Pulse Rate: 74 (10/14 0734)  Labs: Recent Labs    03/07/20 1816 03/08/20 0214  HGB 9.8* 9.7*  HCT 32.7* 33.4*  PLT 195 180  HEPARINUNFRC  --  0.30  CREATININE 1.88* 1.95*    Estimated Creatinine Clearance: 35.7 mL/min (A) (by C-G formula based on SCr of 1.95 mg/dL (H)).   Assessment: 69 year old female found to be COVID positive with symptoms beginning ~5 days prior to presentation to the ED.Pt has CKDIII and concern for PE.   Heparin level 0.3  Goal of Therapy:  Heparin level 0.3-0.7 units/ml Monitor platelets by anticoagulation protocol: Yes   Plan:  Increase heparin to 1500 units / hr Monitor s/sx bleeding, CBC, HL,  clinical progress, and imaging studies  Thank you Anette Guarneri, PharmD [phone]  PM -> To Lovenox for VTE prophylaxis, D-dimer 1.72 Thank you Anette Guarneri, PharmD  03/08/2020,8:26 AM

## 2020-03-08 NOTE — Plan of Care (Signed)
Patient is currently resting in bed. C/o lower abd pain, refused any pain intervention. Not OOB. VSS. Remains on 3L [place]. [place] in place. Call bell within reach. Bed alarm on.   Patient took off insulin pump this AM, d/t having limited supplies for it.   Problem: Education: Goal: Knowledge of General Education information will improve Description: Including pain rating scale, medication(s)/side effects and non-pharmacologic comfort measures Outcome: Progressing   Problem: Health Behavior/Discharge Planning: Goal: Ability to manage health-related needs will improve Outcome: Progressing   Problem: Clinical Measurements: Goal: Ability to maintain clinical measurements within normal limits will improve Outcome: Progressing Goal: Will remain free from infection Outcome: Progressing Goal: Diagnostic test results will improve Outcome: Progressing Goal: Respiratory complications will improve Outcome: Progressing Goal: Cardiovascular complication will be avoided Outcome: Progressing   Problem: Activity: Goal: Risk for activity intolerance will decrease Outcome: Progressing   Problem: Nutrition: Goal: Adequate nutrition will be maintained Outcome: Progressing   Problem: Coping: Goal: Level of anxiety will decrease Outcome: Progressing   Problem: Elimination: Goal: Will not experience complications related to bowel motility Outcome: Progressing Goal: Will not experience complications related to urinary retention Outcome: Progressing   Problem: Pain Managment: Goal: General experience of comfort will improve Outcome: Progressing   Problem: Safety: Goal: Ability to remain free from injury will improve Outcome: Progressing   Problem: Skin Integrity: Goal: Risk for impaired skin integrity will decrease Outcome: Progressing   Problem: Education: Goal: Knowledge of risk factors and measures for prevention of condition will improve Outcome: Progressing   Problem:  Coping: Goal: Psychosocial and spiritual needs will be supported Outcome: Progressing   Problem: Respiratory: Goal: Will maintain a patent airway Outcome: Progressing Goal: Complications related to the disease process, condition or treatment will be avoided or minimized Outcome: Progressing

## 2020-03-08 NOTE — Progress Notes (Signed)
Patient verbalized that this afternoon she started to have pain with urination, burning and itching. VS stable. MD notified.

## 2020-03-08 NOTE — Progress Notes (Signed)
Heparin drip was stopped per order and Lovenox [place] was started.

## 2020-03-09 LAB — GLUCOSE, CAPILLARY
Glucose-Capillary: 471 mg/dL — ABNORMAL HIGH (ref 70–99)
Glucose-Capillary: 500 mg/dL — ABNORMAL HIGH (ref 70–99)
Glucose-Capillary: 502 mg/dL (ref 70–99)
Glucose-Capillary: 519 mg/dL (ref 70–99)
Glucose-Capillary: 523 mg/dL (ref 70–99)
Glucose-Capillary: 562 mg/dL (ref 70–99)
Glucose-Capillary: 593 mg/dL (ref 70–99)

## 2020-03-09 LAB — COMPREHENSIVE METABOLIC PANEL
ALT: 14 U/L (ref 0–44)
AST: 18 U/L (ref 15–41)
Albumin: 2.4 g/dL — ABNORMAL LOW (ref 3.5–5.0)
Alkaline Phosphatase: 99 U/L (ref 38–126)
Anion gap: 11 (ref 5–15)
BUN: 72 mg/dL — ABNORMAL HIGH (ref 8–23)
CO2: 23 mmol/L (ref 22–32)
Calcium: 7.9 mg/dL — ABNORMAL LOW (ref 8.9–10.3)
Chloride: 93 mmol/L — ABNORMAL LOW (ref 98–111)
Creatinine, Ser: 2.27 mg/dL — ABNORMAL HIGH (ref 0.44–1.00)
GFR, Estimated: 21 mL/min — ABNORMAL LOW (ref >60)
Glucose, Bld: 578 mg/dL (ref 70–99)
Potassium: 4.6 mmol/L (ref 3.5–5.1)
Sodium: 127 mmol/L — ABNORMAL LOW (ref 135–145)
Total Bilirubin: 0.5 mg/dL (ref 0.3–1.2)
Total Protein: 5.2 g/dL — ABNORMAL LOW (ref 6.5–8.1)

## 2020-03-09 LAB — URINE CULTURE

## 2020-03-09 LAB — OSMOLALITY: Osmolality: 317 mOsm/kg — ABNORMAL HIGH (ref 275–295)

## 2020-03-09 LAB — D-DIMER, QUANTITATIVE: D-Dimer, Quant: 1.49 ug/mL-FEU — ABNORMAL HIGH (ref 0.00–0.50)

## 2020-03-09 LAB — C-REACTIVE PROTEIN: CRP: 10.8 mg/dL — ABNORMAL HIGH (ref <1.0)

## 2020-03-09 LAB — BETA-HYDROXYBUTYRIC ACID: Beta-Hydroxybutyric Acid: 0.15 mmol/L (ref 0.05–0.27)

## 2020-03-09 MED ORDER — PREDNISONE 20 MG PO TABS
50.0000 mg | ORAL_TABLET | Freq: Every day | ORAL | Status: DC
  Administered 2020-03-10 – 2020-03-11 (×2): 50 mg via ORAL
  Filled 2020-03-09 (×2): qty 2

## 2020-03-09 MED ORDER — INSULIN ASPART 100 UNIT/ML ~~LOC~~ SOLN
8.0000 [IU] | Freq: Three times a day (TID) | SUBCUTANEOUS | Status: DC
  Administered 2020-03-09 (×2): 8 [IU] via SUBCUTANEOUS

## 2020-03-09 MED ORDER — INSULIN GLARGINE 100 UNIT/ML ~~LOC~~ SOLN
35.0000 [IU] | Freq: Two times a day (BID) | SUBCUTANEOUS | Status: DC
  Administered 2020-03-09: 35 [IU] via SUBCUTANEOUS
  Filled 2020-03-09 (×3): qty 0.35

## 2020-03-09 MED ORDER — INSULIN ASPART 100 UNIT/ML ~~LOC~~ SOLN
10.0000 [IU] | Freq: Once | SUBCUTANEOUS | Status: AC
  Administered 2020-03-09: 10 [IU] via SUBCUTANEOUS

## 2020-03-09 MED ORDER — INSULIN ASPART 100 UNIT/ML ~~LOC~~ SOLN
15.0000 [IU] | SUBCUTANEOUS | Status: AC
  Administered 2020-03-09: 15 [IU] via SUBCUTANEOUS

## 2020-03-09 MED ORDER — INSULIN GLARGINE 100 UNIT/ML ~~LOC~~ SOLN
35.0000 [IU] | Freq: Every day | SUBCUTANEOUS | Status: DC
  Administered 2020-03-09: 35 [IU] via SUBCUTANEOUS
  Filled 2020-03-09: qty 0.35

## 2020-03-09 MED ORDER — INSULIN ASPART 100 UNIT/ML ~~LOC~~ SOLN
12.0000 [IU] | Freq: Three times a day (TID) | SUBCUTANEOUS | Status: DC
  Administered 2020-03-09: 12 [IU] via SUBCUTANEOUS

## 2020-03-09 MED ORDER — INSULIN ASPART 100 UNIT/ML ~~LOC~~ SOLN
12.0000 [IU] | Freq: Once | SUBCUTANEOUS | Status: AC
  Administered 2020-03-09: 12 [IU] via SUBCUTANEOUS

## 2020-03-09 NOTE — TOC Initial Note (Signed)
Transition of Care [facility]) - Initial/Assessment Note    Patient Details  Name: Susan [name] MRN: [id number] Date of Birth: Jun 23, 1950  [facility] CM/SW Contact:    Verdell Carmine, RN Phone Number: 03/09/2020, 8:46 AM  Clinical Narrative:                 Readmission for patient admitted with COVID and increased BNP, leg swelling. Patient on Lasix, has been vaccinated in March and April with second dose. Currently on 3LPM [place]. moderately Short of Breath.  Has oxygen at home. Will need to continue, may need home health CM will follow for needs.   Expected Discharge Plan: [place] Barriers to Discharge: Continued Medical Work up   Patient Goals and CMS Choice        Expected Discharge Plan and Services Expected Discharge Plan: [place]   Discharge Planning Services: CM Consult   Living arrangements for the past 2 months: Single Family Home                                      Prior Living Arrangements/Services Living arrangements for the past 2 months: Single Family Home Lives with:: Spouse Patient language and need for interpreter reviewed:: Yes        Need for Family Participation in Patient Care: Yes (Comment) Care giver support system in place?: Yes (comment) Current home services: DME Criminal Activity/Legal Involvement Pertinent to Current Situation/Hospitalization: No - Comment as needed  Activities of Daily Living Home Assistive Devices/Equipment: Cane (specify quad or straight), Walker (specify type) (cont. insulin pump) ADL Screening (condition at time of admission) Patient's cognitive ability adequate to safely complete daily activities?: Yes Is the patient deaf or have difficulty hearing?: No Does the patient have difficulty seeing, even when wearing glasses/contacts?: No Does the patient have difficulty concentrating, remembering, or making decisions?: No Patient able to express need for  assistance with ADLs?: No Does the patient have difficulty dressing or bathing?: No Independently performs ADLs?: Yes (appropriate for developmental age) Does the patient have difficulty walking or climbing stairs?: Yes Weakness of Legs: None Weakness of Arms/Hands: None  Permission Sought/Granted                  Emotional Assessment       Orientation: : Oriented to Situation, Oriented to  Time, Oriented to Place, Oriented to Self Alcohol / Substance Use: Not Applicable Psych Involvement: No (comment)  Admission diagnosis:  Hypoxia [R09.02] Acute on chronic congestive heart failure, unspecified heart failure type ([place]) [I50.9] Pneumonia due to COVID-19 virus [U07.1, J12.82] Patient Active Problem List   Diagnosis Date Noted  . Acute on chronic diastolic CHF (congestive heart failure) ([place]) 03/07/2020  . Pneumonia due to COVID-19 virus 03/07/2020  . Hypoxia 03/07/2020  . Type 2 diabetes mellitus with stage 4 chronic kidney disease ([place]) 03/07/2020  . Obesity, Class III, BMI 40-49.9 (morbid obesity) ([place]) 03/07/2020  . Acute respiratory failure ([place]) 02/24/2020  . Enteritis, enteropathogenic E. coli 02/10/2020  . DKA (diabetic ketoacidoses) 02/07/2020  . Common bile duct (CBD) obstruction 05/28/2019  . Benign neoplasm of ascending colon   . Benign neoplasm of transverse colon   . Benign neoplasm of descending colon   . Benign neoplasm of sigmoid colon   . Gastric polyps   . Hyperkalemia 03/11/2019  . Prolonged QT interval 03/11/2019  .  Onychomycosis 06/21/2018  . Pressure injury of skin 03/19/2018  . Syncope 03/18/2018  . Osteomyelitis of second toe of right foot ([place])   . Toe osteomyelitis, right ([place])   . 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 ([place])   . PVD (peripheral vascular disease) ([place]) 10/26/2017  . Hypothyroid 07/27/2017  . PAH (pulmonary artery hypertension) ([place])   . Impaired ambulation 07/19/2017  . Leg cramps 02/27/2017  .  Peripheral edema 01/12/2017  . Diabetic neuropathy ([place]) 11/12/2016  . 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 mellitus ([place]) 10/24/2015  . Anemia 10/03/2015  . Generalized anxiety disorder 10/03/2015  . Insomnia 10/03/2015  . Chest pain   . Gastritis 06/07/2015  . Diabetes mellitus type II, uncontrolled ([place]) 06/07/2015  . Chronic diastolic CHF (congestive heart failure) ([place]) 06/07/2015  . Non compliance with medical treatment 04/17/2014  . Rotator cuff tear 03/14/2014  . Class 3 obesity 09/23/2013  . Acute kidney injury superimposed on chronic kidney disease ([place]) 12/25/2012  . Hypotension 12/25/2012  . Urinary incontinence   . MDD (major depressive disorder) 11/12/2010  . RBBB (right bundle branch block)   . CAD (coronary artery disease)   . Hyperlipemia 01/22/2009  . Essential hypertension 01/22/2009   PCP:  Alycia Rossetti, MD Pharmacy:   [id number] [place], [place] [address] [place] [address] Phone: [phone] Fax: [phone]     Social Determinants of Health (SDOH) Interventions    Readmission Risk Interventions Readmission Risk Prevention Plan 02/09/2020 02/08/2020  Transportation Screening Complete Complete  Medication Review ([place]) Complete Complete  PCP or Specialist appointment within 3-5 days of discharge Complete -  [place] or [place] Complete Complete  SW Recovery Care/Counseling Consult Complete Complete  Palliative Care Screening Not Applicable Not [place] Not Applicable Not Applicable  Some recent data might be hidden

## 2020-03-09 NOTE — Progress Notes (Addendum)
CBG 521. Marlowe Sax, MD notified at 2114. Novolog 7 units given per one time order. CBG rechecked per Marlowe Sax, MD. CBG 523. Marlowe Sax, MD notified via text page. Patient states she has not eaten since dinner. BMP ordered stat, phlebotomy notified face-to-face. Will continue to monitor.

## 2020-03-09 NOTE — Progress Notes (Signed)
TRH1 notified of glucose 502. Patient will be given nightly dose of insulin and will continue to monitor

## 2020-03-09 NOTE — Evaluation (Signed)
Occupational Therapy Evaluation Patient Details Name: Susan [name] MRN: [id number] DOB: November 19, 1950 Today's Date: 03/09/2020    History of Present Illness 69 yo female presenting with shortness of breath, chest pain, generalized weakness, vomiting, and fevers. Tested COVID positive. Unable to tolerate home medication due to vomiting. PMH including CHF, HTN, anemia, CAD, depression, stroke, and diabetes.   Clinical Impression   PTA, pt was living with her husband and son (who has a disability) and was independent with ADLs, IADLs, and functional mobility using AE and cane. Pt recently has been using a RW due to fatigue. Pt currently requiring Min A for UB ADLs, Min-Max A for LB ADLs, and Min Guard-Min A for functional mobility with RW. During mobility, SpO2 ins 90s on 2L [place]. Pt requiring seated rest breaks due to fatigue. Pt also presenting with posterior lean in standing activities. Pt would benefit from further acute OT to facilitate safe dc. Recommend dc to home with HHOT for further OT to optimize safety, independence with ADLs, and return to PLOF.   SpO2 90s on 2L. HR 70s.     Follow Up Recommendations  Home health OT;Supervision/Assistance - 24 hour    Equipment Recommendations  None recommended by OT    Recommendations for Other Services PT consult     Precautions / Restrictions Precautions Precautions: Fall Precaution Comments: Watch SpO2      Mobility Bed Mobility               General bed mobility comments: In recliner upon arrival  Transfers Overall transfer level: Needs assistance Equipment used: Rolling walker (2 wheeled);1 person hand held assist Transfers: Sit to/from [employer] Sit to Stand: Min assist;Min guard Stand pivot transfers: Min assist       General transfer comment: Min A for initial power up into standing with hand held A. Min guard A for safety with sit<>stand using RW. Min A for balance during pivot without DME     Balance Overall balance assessment: Needs assistance Sitting-balance support: No upper extremity supported;Feet supported Sitting balance-Leahy Scale: Fair     Standing balance support: Bilateral upper extremity supported;During functional activity Standing balance-Leahy Scale: Poor Standing balance comment: Reliant on UE support                           ADL either performed or assessed with clinical judgement   ADL Overall ADL's : Needs assistance/impaired Eating/Feeding: Supervision/ safety;Set up;Sitting   Grooming: Supervision/safety;Set up;Wash/dry hands;Sitting   Upper Body Bathing: Minimal assistance;Sitting   Lower Body Bathing: Minimal assistance;Sit to/from stand   Upper Body Dressing : Supervision/safety;Set up;Sitting   Lower Body Dressing: Maximal assistance;Sit to/from stand Lower Body Dressing Details (indicate cue type and reason): Max A to don socks. Pt uses reacher at home Toilet Transfer: Minimal assistance;Stand-pivot;BSC;RW   Toileting- Architect and Hygiene: Min guard;Sit to/from stand Toileting - Clothing Manipulation Details (indicate cue type and reason): MIn Guard A for safety     Functional mobility during ADLs: Minimal assistance;Rolling walker General ADL Comments: Pt presenting with decreased balance, strength, and activity tolerance. Very movitated. Presenting with posterior lean in standing     Vision Baseline Vision/History: Wears glasses;Retinopathy (Seeing spots when blood sugar is high) Wears Glasses: Reading only Patient Visual Report: No change from baseline       Perception     Praxis      Pertinent Vitals/Pain Pain Assessment: No/denies pain     Hand  Dominance Right   Extremity/Trunk Assessment Upper Extremity Assessment Upper Extremity Assessment: Overall WFL for tasks assessed   Lower Extremity Assessment Lower Extremity Assessment: Defer to PT evaluation   Cervical / Trunk  Assessment Cervical / Trunk Assessment: Other exceptions Cervical / Trunk Exceptions: Increased body habitus   Communication Communication Communication: No difficulties   Cognition Arousal/Alertness: Awake/alert Behavior During Therapy: WFL for tasks assessed/performed Overall Cognitive Status: Within Functional Limits for tasks assessed                                 General Comments: Very motivated   General Comments  SpO2 >88% with good pleth on 2L.  HR 70s    Exercises Exercises: General Lower Extremity General Exercises - Lower Extremity Hip Flexion/Marching: Strengthening;10 reps;Both;Standing   Shoulder Instructions      Home Living Family/patient expects to be discharged to:: Private residence Living Arrangements: Spouse/significant other Available Help at Discharge: Family;Available 24 hours/day Type of Home: Mobile home Home Access: Ramped entrance     Home Layout: One level     Bathroom Shower/Tub: Tub/shower unit;Door   [employer] Toilet: Handicapped height Bathroom Accessibility: Yes   Home Equipment: Grab bars - tub/shower;Walker - 2 wheels;Walker - 4 wheels;Wheelchair - manual;Bedside commode;Cane - quad          Prior Functioning/Environment Level of Independence: Needs assistance  Gait / Transfers Assistance Needed: Uses cane ADL's / Homemaking Assistance Needed: Performs BADLs and uses AE for LB ADLs. Wear slip on shoes. Son helps with compression socks.    Comments: Pt reports that prior to COVID (~1 week ago), she was performing all ADLs and IADLs including driving. Past weak she has been very fatigued.        OT Problem List: Decreased strength;Decreased range of motion;Decreased activity tolerance;Impaired balance (sitting and/or standing);Decreased knowledge of use of DME or AE;Decreased knowledge of precautions;Cardiopulmonary status limiting activity      OT Treatment/Interventions: Self-care/ADL training;Therapeutic  exercise;Energy conservation;DME and/or AE instruction;Therapeutic activities;Patient/family education    OT Goals(Current goals can be found in the care plan section) Acute Rehab OT Goals Patient Stated Goal: Go home OT Goal Formulation: With patient Time For Goal Achievement: 03/23/20 Potential to Achieve Goals: Good  OT Frequency: Min 2X/week   Barriers to D/C:            Co-evaluation              AM-PAC OT "6 Clicks" Daily Activity     Outcome Measure Help from another person eating meals?: A Little Help from another person taking care of personal grooming?: A Little Help from another person toileting, which includes using toliet, bedpan, or urinal?: A Little Help from another person bathing (including washing, rinsing, drying)?: A Little Help from another person to put on and taking off regular upper body clothing?: A Little Help from another person to put on and taking off regular lower body clothing?: A Lot 6 Click Score: 17   End of Session Equipment Utilized During Treatment: Rolling walker;Oxygen;Gait belt (2L) Nurse Communication: Mobility status  Activity Tolerance: Patient tolerated treatment well Patient left: in chair;with call bell/phone within reach  OT Visit Diagnosis: Unsteadiness on feet (R26.81);Other abnormalities of gait and mobility (R26.89)                Time: 1610-9604 OT Time Calculation (min): 33 min Charges:  OT General Charges $OT Visit: 1 Visit OT Evaluation $OT  Eval Moderate Complexity: 1 Mod OT Treatments $Self Care/Home Management : 8-22 mins  Stefani Baik MSOT, OTR/L Acute Rehab Pager: [phone] Office: [phone]  Theodoro Grist Desirae Mancusi 03/09/2020, 3:58 PM

## 2020-03-09 NOTE — Progress Notes (Signed)
Marlowe Sax, MD called this RN since BMP not resulted. CBG 519. Novolog 10 units given per one time dose. Will continue to monitor.

## 2020-03-09 NOTE — Progress Notes (Signed)
Inpatient Diabetes Program Recommendations  AACE/ADA: New Consensus Statement on Inpatient Glycemic Control (2015)  Target Ranges:  Prepandial:   less than 140 mg/dL      Peak postprandial:   less than 180 mg/dL (1-2 hours)      Critically ill patients:  140 - 180 mg/dL   Lab Results  Component Value Date   SLHTDS 287 (HH) 03/09/2020   HGBA1C 11.8 (H) 12/21/2019    Review of Glycemic Control Results for Susan [name], Susan [name] (MRN [id number]) as of 03/09/2020 11:35  Ref. Range 03/08/2020 21:09 03/09/2020 00:34 03/09/2020 04:03 03/09/2020 04:33 03/09/2020 07:49  Glucose-Capillary Latest Ref Range: 70 - 99 mg/dL 521 (HH) 523 (HH) 519 (HH) 562 (HH) 593 (HH)   Diabetes history:  DM2 Outpatient Diabetes medications:  Omnipod with Novolin R- 80-150-7 units 151-200-9 units 201-250-12 units  251-300-14 units  301-400-17 units Novolin N- If > 200 in the am give 10 units Give 5 units qpm Current orders for Inpatient glycemic control:  Lantus 35 units daily Novolog 0-20 units tid Novolog 0-5 units qhs Novolog 8 units tid with meals Prednisone 50 mg daily  Note:  Spoke with patient on the phone.  She disconnected her Omnipod yesterday.  Last A1C was 11.8% in July.  Current A1C is pending.  CBG's very elevated since yesterday.  MD made changes to insulin and steroids as listed above.  Explained to patient steroids will significantly increase her blood sugar and we will follow her while inpatient.    She states she has been eating bad and not watching her CHO intake.  She states she is aware of CHO's and knows which foods and beverages to avoid.  See is current with [facility] and sees him every 3 months.  She denies difficulties obtaining insulins or supplies.    She checks her CBG's at least 3 x a day.   Will continue to follow while inpatient.  Thank you, Susan Dixon, RN, BSN Diabetes Coordinator Inpatient Diabetes Program [phone] (team pager from 8a-5p)

## 2020-03-09 NOTE — Progress Notes (Addendum)
PROGRESS NOTE    [name] [facility]  FBP:[id number] DOB: 03-21-1951 DOA: 03/07/2020 PCP: Salley Scarlet, MD    Brief Narrative:  Susan [name] is a 69 year old female with past medical history notable for chronic diastolic congestive heart failure, essential hypertension, type 2 diabetes mellitus, CKD stage IV, obesity, peripheral vascular disease, chronic neuropathy who presented to the ED via EMS with complaints of progressive shortness of breath, cough, generalized weakness with nausea and vomiting.  Patient reports symptom onset 5 days prior, associated with dyspnea on exertion and nonproductive cough.  Patient was seen by PCP and found to be hypoxic with SPO2 82% on room air.  Patient also reports weight gain over the past several weeks with increased swelling in her lower extremities.  Patient has been vaccine against Covid-19 which were administered in March and April 2021.  No recent sick contacts, no known Covid-19 exposures.  In the ED, Covid-19 PCR positive.  Noted to have an elevated BNP with chest x-ray findings of increased interstitial markings.  TRH consulted for admission for further evaluation and treatment of acute hypoxic restaurant failure secondary to CHF exacerbation and Covid-19 viral pneumonia.   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  Chest x-ray with bilateral increased  interstitial markings consistent with edema versus viral/atypical respiratory infection.  Covid-19 PCR positive.  Patient was hypoxic at PCP office with SPO2 82% on room air. --COVID test: + 03/07/2020 --CRP 10.7>14.3>10.5 --ddimer 1.79>1.72>1.49 --Remdesivir, plan 5-day course (Day #3/5) --Transition Solumedrol 123mg  IV q12h to prednisone 50mg  PO daily 10/16 --prone for 2-3hrs every 12hrs if able --Continue supplemental oxygen, titrate to maintain SPO2 greater than 92%, currently on 2 L nasal cannula with SPO2 95% --Continue supportive care with albuterol MDI prn, vitamin C, zinc, Tylenol, antitussives (benzonatate/ Mucinex/Tussionex) --Follow CBC, CMP, D-dimer, ferritin, and CRP daily --Continue airborne/contact isolation precautions for 3 weeks from the day of diagnosis  The treatment plan and use of medications and known side effects were discussed with patient/family. Some of the medications used are based on case reports/anecdotal data.  All other medications being used in the management of COVID-19 based on limited study data.  Complete risks and long-term side effects are unknown, however in the best clinical judgment they seem to be of some benefit.  Patient wanted to proceed with treatment options provided.  Acute on chronic diastolic congestive heart failure, decompensated Hx essential hypertension Recent TTE 02/08/2020 with LVEF 55-60%, indeterminant diastolic dysfunction previously diagnosed with grade 1 diastolic dysfunction on TTE on 03/19/2018.  BNP elevated 1301 with chest x-ray findings of bilateral increased pulmonary interstitial markings consistent with edema. --Discontinue Lasix today --Carvedilol 5 mg twice daily --Strict I's and O's and daily weights  Type 2 diabetes mellitus Hemoglobin A1c 11.8 on 12/21/2019, poorly controlled.  On NPH 10-15 units subcutaneously qAM and Novolin R sliding scale.  Elevated blood sugars during hospitalization likely direct result  of  high-dose IV steroid use. --Hemoglobin A1c pending --Increase Lantus to 35u [place] BID --Increase Novolog to 12u TIDAC --Resistant insulin sliding scale for further coverage --CBGs before every meal/at bedtime  CKD stage IV Creatinine 1.88 on admission, stable. --Cr 1.88>1.95>2.27 --Avoid nephrotoxins, renally dose all medications --Continue monitor BMP daily in the setting of aggressive IV diuresis as above closely  CAD --Continue aspirin 81 mg p.o. daily, Plavix 75 mg p.o. daily  Iron deficiency anemia --Continue ferrous sulfate 325 mg p.o. twice daily  Anxiety/depression: Continue Lexapro 20 g p.o. daily  Gout: Continue allopurinol 100 mg p.o. twice daily  Hypothyroidism --Levothyroxine 50 mcg p.o. daily  Neuropathy: Lyrica 150 mg p.o. twice daily  Obesity Body mass index is 44.41 kg/m.  Counseled on need for aggressive lifestyle changes/weight loss as this complicates all facets of care.    DVT prophylaxis: Lovenox Code Status: Full code Family Communication: Updated patient extensively at bedside  Disposition Plan:  Status is: Inpatient  Remains inpatient appropriate because:Ongoing diagnostic testing needed not appropriate for outpatient work up, Unsafe d/c plan, IV treatments appropriate due to intensity of illness or inability to take PO and Inpatient level of care appropriate due to severity of illness   Dispo: The patient is from: Home              Anticipated d/c is to: Home              Anticipated d/c date is: 3 days              Patient currently is not medically stable to d/c.   Consultants:   None  Procedures:   None  Antimicrobials:   None   Subjective: Patient seen and examined bedside, resting comfortably.  Continues with shortness of breath, slightly improved but not back to her normal baseline.  Continues on 2 L nasal cannula, not on home O2.  Patient's blood sugar has been significantly elevated overnight, likely direct effect from IV  high-dose steroid use.  Continues with fatigue/weakness.  No other complaints or concerns at this time. Denies headache, no dizziness, no chest pain, no palpitations, no abdominal pain, no fever/chills/night sweats, no nausea/vomiting/diarrhea, no abdominal pain, no paresthesias.  No acute events overnight per nursing staff.  Objective: Vitals:   03/09/20 0430 03/09/20 0600 03/09/20 0754 03/09/20 1200  BP: (!) 155/74  (!) 157/79 (!) 153/72  Pulse: 80 73 78 67  Resp: 18  17 17   Temp: 97.8 F (36.6 C)  97.9 F (36.6 C) 98 F (36.7 C)  TempSrc:   Oral Oral  SpO2: 97% 95% 96% 95%  Weight: 124.8 kg     Height:        Intake/Output Summary (Last 24 hours) at 03/09/2020 1329 Last data filed at 03/09/2020 1158 Gross per 24 hour  Intake 780 ml  Output 3350 ml  Net -2570 ml   Filed Weights   03/07/20 1708 03/08/20 0421 03/09/20 0430  Weight: 122.5 kg 121.5 kg 124.8 kg    Examination:  General exam: Appears calm and comfortable  Respiratory system: Clear to auscultation. Respiratory effort normal.  On 2 L nasal cannula Cardiovascular system: S1 & S2 heard, RRR. No JVD, murmurs, rubs, gallops or clicks. No pedal edema. Gastrointestinal system: Abdomen is nondistended, soft and nontender. No organomegaly or masses felt. Normal bowel sounds heard. Central nervous system: Alert and oriented. No focal neurological deficits. Extremities: Symmetric 5 x 5 power. Skin: No rashes, lesions or ulcers Psychiatry: Judgement and  insight appear normal. Mood & affect appropriate.     Data Reviewed: I have personally reviewed following labs and imaging studies  CBC: Recent Labs  Lab 03/07/20 1816 03/08/20 0214  WBC 5.4 3.8*  NEUTROABS 4.7  --   HGB 9.8* 9.7*  HCT 32.7* 33.4*  MCV 87.2 89.1  PLT 195 180   Basic Metabolic Panel: Recent Labs  Lab 03/07/20 1816 03/08/20 0214 03/09/20 0055  NA 137 139 127*  K 3.6 4.7 4.6  CL 99 101 93*  CO2 27 27 23   GLUCOSE 164* 233* 578*  BUN 49*  51* 72*  CREATININE 1.88* 1.95* 2.27*  CALCIUM 8.7* 8.6* 7.9*   GFR: Estimated Creatinine Clearance: 31.1 mL/min (A) (by C-G formula based on SCr of 2.27 mg/dL (H)). Liver Function Tests: Recent Labs  Lab 03/08/20 0214 03/09/20 0055  AST 19 18  ALT 16 14  ALKPHOS 112 99  BILITOT 0.6 0.5  PROT 5.9* 5.2*  ALBUMIN 2.6* 2.4*   No results for input(s): LIPASE, AMYLASE in the last 168 hours. No results for input(s): AMMONIA in the last 168 hours. Coagulation Profile: No results for input(s): INR, PROTIME in the last 168 hours. Cardiac Enzymes: No results for input(s): CKTOTAL, CKMB, CKMBINDEX, TROPONINI in the last 168 hours. BNP (last 3 results) No results for input(s): PROBNP in the last 8760 hours. HbA1C: No results for input(s): HGBA1C in the last 72 hours. CBG: Recent Labs  Lab 03/09/20 0034 03/09/20 0403 03/09/20 0433 03/09/20 0749 03/09/20 1153  GLUCAP 523* 519* 562* 593* 500*   Lipid Profile: Recent Labs    03/07/20 1808  TRIG 125   Thyroid Function Tests: No results for input(s): TSH, T4TOTAL, FREET4, T3FREE, THYROIDAB in the last 72 hours. Anemia Panel: Recent Labs    03/07/20 1933  FERRITIN 187   Sepsis Labs: Recent Labs  Lab 03/07/20 1816 03/07/20 1933  PROCALCITON  --  0.31  LATICACIDVEN 1.0 0.9    Recent Results (from the past 240 hour(s))  Respiratory Panel by RT PCR (Flu A&B, Covid) - Nasopharyngeal Swab     Status: Abnormal   Collection Time: 03/07/20  5:01 PM   Specimen: Nasopharyngeal Swab  Result Value Ref Range Status   SARS Coronavirus 2 by RT PCR POSITIVE (A) NEGATIVE Final    Comment: RESULT CALLED TO, READ BACK BY AND VERIFIED WITH: [name] RN 03/07/20 AT 1923 SK (NOTE) SARS-CoV-2 target nucleic acids are DETECTED.  SARS-CoV-2 RNA is generally detectable in upper respiratory specimens  during the acute phase of infection. Positive results are indicative of the presence of the identified virus, but do not rule out bacterial  infection or co-infection with other pathogens not detected by the test. Clinical correlation with patient history and other diagnostic information is necessary to determine patient infection status. The expected result is Negative.  Fact Sheet for Patients:  https://www.moore.com/  Fact Sheet for Healthcare Providers: https://www.young.biz/  This test is not yet approved or cleared by the [place] FDA and  has been authorized for detection and/or diagnosis of SARS-CoV-2 by FDA under an Emergency Use Authorization (EUA).  This EUA will remain in effect (meaning this test can be use d) for the duration of  the COVID-19 declaration under Section 564(b)(1) of the Act, 21 U.S.C. section 360bbb-3(b)(1), unless the authorization is terminated or revoked sooner.      Influenza A by PCR NEGATIVE NEGATIVE Final   Influenza B by PCR NEGATIVE NEGATIVE Final    Comment: (NOTE) The  Xpert Xpress SARS-CoV-2/FLU/RSV assay is intended as an aid in  the diagnosis of influenza from Nasopharyngeal swab specimens and  should not be used as a sole basis for treatment. Nasal washings and  aspirates are unacceptable for Xpert Xpress SARS-CoV-2/FLU/RSV  testing.  Fact Sheet for Patients: https://www.moore.com/  Fact Sheet for Healthcare Providers: https://www.young.biz/  This test is not yet approved or cleared by the [place] FDA and  has been authorized for detection and/or diagnosis of SARS-CoV-2 by  FDA under an Emergency Use Authorization (EUA). This EUA will remain  in effect (meaning this test can be used) for the duration of the  Covid-19 declaration under Section 564(b)(1) of the Act, 21  U.S.C. section 360bbb-3(b)(1), unless the authorization is  terminated or revoked. Performed at [facility], [address], [place], [place] [address]   Blood culture (routine x 2)     Status: None  (Preliminary result)   Collection Time: 03/07/20  5:07 PM   Specimen: Site Not Specified; Blood  Result Value Ref Range Status   Specimen Description SITE NOT SPECIFIED  Final   Special Requests   Final    BOTTLES DRAWN AEROBIC AND ANAEROBIC Blood Culture results may not be optimal due to an excessive volume of blood received in culture bottles   Culture   Final    NO GROWTH 2 DAYS Performed at [facility], [address], [place], [place] [address]    Report Status PENDING  Incomplete  Blood culture (routine x 2)     Status: None (Preliminary result)   Collection Time: 03/07/20  6:16 PM   Specimen: BLOOD RIGHT HAND  Result Value Ref Range Status   Specimen Description BLOOD RIGHT HAND  Final   Special Requests   Final    BOTTLES DRAWN AEROBIC AND ANAEROBIC Blood Culture adequate volume   Culture   Final    NO GROWTH 2 DAYS Performed at [facility], [address], [place], [place] [address]    Report Status PENDING  Incomplete  Culture, Urine     Status: Abnormal   Collection Time: 03/08/20  6:22 PM   Specimen: Urine, Clean Catch  Result Value Ref Range Status   Specimen Description URINE, CLEAN CATCH  Final   Special Requests   Final    NONE Performed at [facility], [address] [place], [place] [address]    Culture MULTIPLE SPECIES PRESENT, SUGGEST RECOLLECTION (A)  Final   Report Status 03/09/2020 FINAL  Final         Radiology Studies: DG Chest Portable 1 View  Result Date: 03/07/2020 CLINICAL DATA:  69 year old female with shortness of breath. EXAM: PORTABLE CHEST 1 VIEW COMPARISON:  Chest radiographs 02/06/2020 and earlier. FINDINGS: Portable AP semi upright view at 1721 hours. Stable lung volumes and mediastinal contours. Symmetric increased pulmonary interstitial markings since last month. Pulmonary vasculature appears indistinct. No pneumothorax, consolidation or pleural effusion. Paucity of bowel gas in the upper abdomen. No acute  osseous abnormality identified. IMPRESSION: Bilateral increased pulmonary interstitial markings since last month. Favor interstitial edema over viral/atypical respiratory infection. Electronically Signed   By: Odessa Fleming M.D.   On: 03/07/2020 17:39        Scheduled Meds: . allopurinol  100 mg Oral BID  . aspirin EC  81 mg Oral q morning - 10a  . carvedilol  12.5 mg Oral BID WC  . cholecalciferol  1,000 Units Oral Q supper  .  clopidogrel  75 mg Oral Daily  . enoxaparin (LOVENOX) injection  60 mg Subcutaneous Q24H  . escitalopram  20 mg Oral Daily  . ferrous sulfate  325 mg Oral BID WC  . insulin aspart  0-20 Units Subcutaneous TID WC  . insulin aspart  0-5 Units Subcutaneous QHS  . insulin aspart  8 Units Subcutaneous TID WC  . insulin glargine  35 Units Subcutaneous Daily  . levothyroxine  50 mcg Oral QAC breakfast  . magnesium oxide  400 mg Oral Daily  . pantoprazole  40 mg Oral Daily  . [START ON 03/10/2020] predniSONE  50 mg Oral Q breakfast  . pregabalin  150 mg Oral BID  . traZODone  100 mg Oral QHS   Continuous Infusions: . sodium chloride    . remdesivir 100 mg in NS 100 mL 100 mg (03/09/20 0831)     LOS: 2 days    Time spent: 40 minutes spent on chart review, discussion with nursing staff, consultants, updating family and interview/physical exam; more than 50% of that time was spent in counseling and/or coordination of care.    Alvira Philips [place], DO [facility] Available via Epic secure chat 7am-7pm After these hours, please refer to coverage provider listed on [email or web address] 03/09/2020, 1:29 PM

## 2020-03-10 LAB — COMPREHENSIVE METABOLIC PANEL
ALT: 13 U/L (ref 0–44)
AST: 14 U/L — ABNORMAL LOW (ref 15–41)
Albumin: 2.3 g/dL — ABNORMAL LOW (ref 3.5–5.0)
Alkaline Phosphatase: 88 U/L (ref 38–126)
Anion gap: 11 (ref 5–15)
BUN: 84 mg/dL — ABNORMAL HIGH (ref 8–23)
CO2: 24 mmol/L (ref 22–32)
Calcium: 8.1 mg/dL — ABNORMAL LOW (ref 8.9–10.3)
Chloride: 93 mmol/L — ABNORMAL LOW (ref 98–111)
Creatinine, Ser: 2.05 mg/dL — ABNORMAL HIGH (ref 0.44–1.00)
GFR, Estimated: 24 mL/min — ABNORMAL LOW (ref >60)
Glucose, Bld: 418 mg/dL — ABNORMAL HIGH (ref 70–99)
Potassium: 4.1 mmol/L (ref 3.5–5.1)
Sodium: 128 mmol/L — ABNORMAL LOW (ref 135–145)
Total Bilirubin: 0.5 mg/dL (ref 0.3–1.2)
Total Protein: 5.1 g/dL — ABNORMAL LOW (ref 6.5–8.1)

## 2020-03-10 LAB — D-DIMER, QUANTITATIVE: D-Dimer, Quant: 1.27 ug/mL-FEU — ABNORMAL HIGH (ref 0.00–0.50)

## 2020-03-10 LAB — HEMOGLOBIN A1C
Hgb A1c MFr Bld: 11 % — ABNORMAL HIGH (ref 4.8–5.6)
Mean Plasma Glucose: 269 mg/dL

## 2020-03-10 LAB — GLUCOSE, CAPILLARY
Glucose-Capillary: 366 mg/dL — ABNORMAL HIGH (ref 70–99)
Glucose-Capillary: 350 mg/dL — ABNORMAL HIGH (ref 70–99)
Glucose-Capillary: 304 mg/dL — ABNORMAL HIGH (ref 70–99)
Glucose-Capillary: 269 mg/dL — ABNORMAL HIGH (ref 70–99)

## 2020-03-10 LAB — C-REACTIVE PROTEIN: CRP: 5.8 mg/dL — ABNORMAL HIGH (ref <1.0)

## 2020-03-10 MED ORDER — DOCUSATE SODIUM 100 MG PO CAPS
100.0000 mg | ORAL_CAPSULE | Freq: Two times a day (BID) | ORAL | Status: DC
  Administered 2020-03-10 – 2020-03-13 (×7): 100 mg via ORAL
  Filled 2020-03-10 (×8): qty 1

## 2020-03-10 MED ORDER — INSULIN GLARGINE 100 UNIT/ML ~~LOC~~ SOLN
45.0000 [IU] | Freq: Two times a day (BID) | SUBCUTANEOUS | Status: DC
  Administered 2020-03-10 (×2): 45 [IU] via SUBCUTANEOUS
  Filled 2020-03-10 (×4): qty 0.45

## 2020-03-10 MED ORDER — INSULIN ASPART 100 UNIT/ML ~~LOC~~ SOLN
16.0000 [IU] | Freq: Three times a day (TID) | SUBCUTANEOUS | Status: DC
  Administered 2020-03-10 – 2020-03-11 (×6): 16 [IU] via SUBCUTANEOUS

## 2020-03-10 MED ORDER — POLYETHYLENE GLYCOL 3350 17 G PO PACK
17.0000 g | PACK | Freq: Once | ORAL | Status: AC
  Administered 2020-03-10: 17 g via ORAL
  Filled 2020-03-10: qty 1

## 2020-03-10 MED ORDER — SIMETHICONE 80 MG PO CHEW
80.0000 mg | CHEWABLE_TABLET | Freq: Once | ORAL | Status: AC
  Administered 2020-03-10: 80 mg via ORAL
  Filled 2020-03-10: qty 1

## 2020-03-10 NOTE — Progress Notes (Signed)
PROGRESS NOTE    Susan [name] [facility]  KGM:[id number] DOB: 1950/08/24 DOA: 03/07/2020 PCP: Salley Scarlet, MD    Brief Narrative:  Saveria Ruden is a 69 year old female with past medical history notable for chronic diastolic congestive heart failure, essential hypertension, type 2 diabetes mellitus, CKD stage IV, obesity, peripheral vascular disease, chronic neuropathy who presented to the ED via EMS with complaints of progressive shortness of breath, cough, generalized weakness with nausea and vomiting.  Patient reports symptom onset 5 days prior, associated with dyspnea on exertion and nonproductive cough.  Patient was seen by PCP and found to be hypoxic with SPO2 82% on room air.  Patient also reports weight gain over the past several weeks with increased swelling in her lower extremities.  Patient has been vaccine against Covid-19 which were administered in March and April 2021.  No recent sick contacts, no known Covid-19 exposures.  In the ED, Covid-19 PCR positive.  Noted to have an elevated BNP with chest x-ray findings of increased interstitial markings.  TRH consulted for admission for further evaluation and treatment of acute hypoxic restaurant failure secondary to CHF exacerbation and Covid-19 viral pneumonia.   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  Chest x-ray with bilateral increased  interstitial markings consistent with edema versus viral/atypical respiratory infection.  Covid-19 PCR positive.  Patient was hypoxic at PCP office with SPO2 82% on room air. --COVID test: + 03/07/2020 --CRP 10.7>14.3>10.5>5.8 --ddimer 1.79>1.72>1.49>1.27 --Remdesivir, plan 5-day course (Day #4/5) --Prednisone 50mg  PO daily  --prone for 2-3hrs every 12hrs if able --Continue supplemental oxygen, titrate to maintain SPO2 greater than 92%, currently on 2 L nasal cannula with SPO2 95% --Continue supportive care with albuterol MDI prn, vitamin C, zinc, Tylenol, antitussives (benzonatate/ Mucinex/Tussionex) --Follow CBC, CMP, D-dimer, ferritin, and CRP daily --Continue airborne/contact isolation precautions for 3 weeks from the day of diagnosis --Home O2 desaturation screen today  The treatment plan and use of medications and known side effects were discussed with patient/family. Some of the medications used are based on case reports/anecdotal data.  All other medications being used in the management of COVID-19 based on limited study data.  Complete risks and long-term side effects are unknown, however in the best clinical judgment they seem to be of some benefit.  Patient wanted to proceed with treatment options provided.  Acute on chronic diastolic congestive heart failure, decompensated Hx essential hypertension Recent TTE 02/08/2020 with LVEF 55-60%, indeterminant diastolic dysfunction previously diagnosed with grade 1 diastolic dysfunction on TTE on 03/19/2018.  BNP elevated 1301 with chest x-ray findings of bilateral increased pulmonary interstitial markings consistent with edema. --Discontinue Lasix today --Carvedilol 5 mg twice daily --Strict I's and O's and daily weights  Type 2 diabetes mellitus Hemoglobin A1c 11.8 on 12/21/2019, poorly controlled.  On NPH 10-15 units subcutaneously qAM and Novolin R sliding scale.  Elevated blood sugars during hospitalization likely direct result of  high-dose IV steroid use. --Hemoglobin A1c pending --Increase Lantus to 45u Bruni BID --Increase Novolog to 16u TIDAC --Resistant insulin sliding scale for further coverage --CBGs before every meal/at bedtime  CKD stage IV Creatinine 1.88 on admission, stable. --Cr 1.88>1.95>2.27>2.05 --Avoid nephrotoxins, renally dose all medications --Continue monitor BMP daily in the setting of aggressive IV diuresis as above closely  CAD --Continue aspirin 81 mg p.o. daily, Plavix 75 mg p.o. daily  Iron deficiency anemia --Continue ferrous sulfate 325 mg p.o. twice daily  Anxiety/depression: Continue Lexapro 20 g p.o. daily  Gout: Continue allopurinol 100 mg p.o. twice daily  Hypothyroidism --Levothyroxine 50 mcg p.o. daily  Neuropathy: Lyrica 150 mg p.o. twice daily  Obesity Body mass index is 44.09 kg/m.  Counseled on need for aggressive lifestyle changes/weight loss as this complicates all facets of care.  Weakness, deconditioning: --OT recommends home health OT on discharge --Pending PT evaluation   DVT prophylaxis: Lovenox Code Status: Full code Family Communication: Updated patient extensively at bedside  Disposition Plan:  Status is: Inpatient  Remains inpatient appropriate because:Ongoing diagnostic testing needed not appropriate for outpatient work up, Unsafe d/c plan, IV treatments appropriate due to intensity of illness or inability to take PO and Inpatient level of care appropriate due to severity of illness   Dispo: The patient is from: Home              Anticipated d/c is to: Home              Anticipated d/c date is: 3 days              Patient currently is not medically stable to d/c.   Consultants:   None  Procedures:   None  Antimicrobials:   None   Subjective: Patient seen and examined bedside, resting comfortably.  Continues with mild shortness of breath, improved since yesterday but continues on 2 L nasal cannula.  Not on home O2.  Continues  with elevated blood sugars likely related to high-dose IV steroids, now transition to oral prednisone today.  Also endorses continued weakness/fatigue.  No other complaints or concerns at this time. Denies headache, no dizziness, no chest pain, no palpitations, no abdominal pain, no fever/chills/night sweats, no nausea/vomiting/diarrhea, no abdominal pain, no paresthesias.  No acute events overnight per nursing staff.  Objective: Vitals:   03/10/20 0419 03/10/20 0500 03/10/20 0800 03/10/20 1200  BP: (!) 142/59  (!) 142/55 (!) 128/46  Pulse: 61  70 67  Resp: 18  15 17   Temp: 97.8 F (36.6 C)   98.1 F (36.7 C)  TempSrc: Oral   Axillary  SpO2:   96% 95%  Weight:  123.9 kg    Height:        Intake/Output Summary (Last 24 hours) at 03/10/2020 1255 Last data filed at 03/10/2020 0600 Gross per 24 hour  Intake 180 ml  Output -  Net 180 ml   Filed Weights   03/08/20 0421 03/09/20 0430 03/10/20 0500  Weight: 121.5 kg 124.8 kg 123.9 kg    Examination:  General exam: Appears calm and comfortable  Respiratory system: Clear to auscultation. Respiratory effort normal.  On 2 L nasal cannula with SPO2 96%. Cardiovascular system: S1 & S2 heard, RRR. No JVD, murmurs, rubs, gallops or clicks. No pedal edema. Gastrointestinal system: Abdomen is nondistended, soft and nontender. No organomegaly or masses felt. Normal bowel sounds heard. Central nervous system: Alert and oriented. No focal neurological deficits. Extremities: Symmetric 5 x 5 power. Skin: No  rashes, lesions or ulcers Psychiatry: Judgement and insight appear normal. Mood & affect appropriate.     Data Reviewed: I have personally reviewed following labs and imaging studies  CBC: Recent Labs  Lab 03/07/20 1816 03/08/20 0214  WBC 5.4 3.8*  NEUTROABS 4.7  --   HGB 9.8* 9.7*  HCT 32.7* 33.4*  MCV 87.2 89.1  PLT 195 180   Basic Metabolic Panel: Recent Labs  Lab 03/07/20 1816 03/08/20 0214 03/09/20 0055 03/10/20 0500   NA 137 139 127* 128*  K 3.6 4.7 4.6 4.1  CL 99 101 93* 93*  CO2 27 27 23 24   GLUCOSE 164* 233* 578* 418*  BUN 49* 51* 72* 84*  CREATININE 1.88* 1.95* 2.27* 2.05*  CALCIUM 8.7* 8.6* 7.9* 8.1*   GFR: Estimated Creatinine Clearance: 34.3 mL/min (A) (by C-G formula based on SCr of 2.05 mg/dL (H)). Liver Function Tests: Recent Labs  Lab 03/08/20 0214 03/09/20 0055 03/10/20 0500  AST 19 18 14*  ALT 16 14 13   ALKPHOS 112 99 88  BILITOT 0.6 0.5 0.5  PROT 5.9* 5.2* 5.1*  ALBUMIN 2.6* 2.4* 2.3*   No results for input(s): LIPASE, AMYLASE in the last 168 hours. No results for input(s): AMMONIA in the last 168 hours. Coagulation Profile: No results for input(s): INR, PROTIME in the last 168 hours. Cardiac Enzymes: No results for input(s): CKTOTAL, CKMB, CKMBINDEX, TROPONINI in the last 168 hours. BNP (last 3 results) No results for input(s): PROBNP in the last 8760 hours. HbA1C: Recent Labs    03/09/20 0055  HGBA1C 11.0*   CBG: Recent Labs  Lab 03/09/20 1153 03/09/20 1718 03/09/20 2225 03/10/20 0826 03/10/20 1221  GLUCAP 500* 471* 502* 366* 350*   Lipid Profile: Recent Labs    03/07/20 1808  TRIG 125   Thyroid Function Tests: No results for input(s): TSH, T4TOTAL, FREET4, T3FREE, THYROIDAB in the last 72 hours. Anemia Panel: Recent Labs    03/07/20 1933  FERRITIN 187   Sepsis Labs: Recent Labs  Lab 03/07/20 1816 03/07/20 1933  PROCALCITON  --  0.31  LATICACIDVEN 1.0 0.9    Recent Results (from the past 240 hour(s))  Respiratory Panel by RT PCR (Flu A&B, Covid) - Nasopharyngeal Swab     Status: Abnormal   Collection Time: 03/07/20  5:01 PM   Specimen: Nasopharyngeal Swab  Result Value Ref Range Status   SARS Coronavirus 2 by RT PCR POSITIVE (A) NEGATIVE Final    Comment: RESULT CALLED TO, READ BACK BY AND VERIFIED WITH: [name] RN 03/07/20 AT 1923 SK (NOTE) SARS-CoV-2 target nucleic acids are DETECTED.  SARS-CoV-2 RNA is generally detectable in  upper respiratory specimens  during the acute phase of infection. Positive results are indicative of the presence of the identified virus, but do not rule out bacterial infection or co-infection with other pathogens not detected by the test. Clinical correlation with patient history and other diagnostic information is necessary to determine patient infection status. The expected result is Negative.  Fact Sheet for Patients:  https://www.moore.com/  Fact Sheet for Healthcare Providers: https://www.young.biz/  This test is not yet approved or cleared by the [place] FDA and  has been authorized for detection and/or diagnosis of SARS-CoV-2 by FDA under an Emergency Use Authorization (EUA).  This EUA will remain in effect (meaning this test can be use d) for the duration of  the COVID-19 declaration under Section 564(b)(1) of the Act, 21 U.S.C. section 360bbb-3(b)(1), unless the authorization is terminated or revoked sooner.  Influenza A by PCR NEGATIVE NEGATIVE Final   Influenza B by PCR NEGATIVE NEGATIVE Final    Comment: (NOTE) The Xpert Xpress SARS-CoV-2/FLU/RSV assay is intended as an aid in  the diagnosis of influenza from Nasopharyngeal swab specimens and  should not be used as a sole basis for treatment. Nasal washings and  aspirates are unacceptable for Xpert Xpress SARS-CoV-2/FLU/RSV  testing.  Fact Sheet for Patients: https://www.moore.com/  Fact Sheet for Healthcare Providers: https://www.young.biz/  This test is not yet approved or cleared by the [place] FDA and  has been authorized for detection and/or diagnosis of SARS-CoV-2 by  FDA under an Emergency Use Authorization (EUA). This EUA will remain  in effect (meaning this test can be used) for the duration of the  Covid-19 declaration under Section 564(b)(1) of the Act, 21  U.S.C. section 360bbb-3(b)(1), unless the  authorization is  terminated or revoked. Performed at [facility], [address] [place], [place] [address]   Blood culture (routine x 2)     Status: None (Preliminary result)   Collection Time: 03/07/20  5:07 PM   Specimen: Site Not Specified; Blood  Result Value Ref Range Status   Specimen Description SITE NOT SPECIFIED  Final   Special Requests   Final    BOTTLES DRAWN AEROBIC AND ANAEROBIC Blood Culture results may not be optimal due to an excessive volume of blood received in culture bottles   Culture   Final    NO GROWTH 3 DAYS Performed at [facility], [address], [place], [place] [address]    Report Status PENDING  Incomplete  Blood culture (routine x 2)     Status: None (Preliminary result)   Collection Time: 03/07/20  6:16 PM   Specimen: BLOOD RIGHT HAND  Result Value Ref Range Status   Specimen Description BLOOD RIGHT HAND  Final   Special Requests   Final    BOTTLES DRAWN AEROBIC AND ANAEROBIC Blood Culture adequate volume   Culture   Final    NO GROWTH 3 DAYS Performed at [facility], [address] [place], [place] [address]    Report Status PENDING  Incomplete  Culture, Urine     Status: Abnormal   Collection Time: 03/08/20  6:22 PM   Specimen: Urine, Clean Catch  Result Value Ref Range Status   Specimen Description URINE, CLEAN CATCH  Final   Special Requests   Final    NONE Performed at [facility], [address] [place], [place] [address]    Culture MULTIPLE SPECIES PRESENT, SUGGEST RECOLLECTION (A)  Final   Report Status 03/09/2020 FINAL  Final         Radiology Studies: No results found.      Scheduled Meds: . allopurinol  100 mg Oral BID  . aspirin EC  81 mg Oral q morning - 10a  . carvedilol  12.5 mg Oral BID WC  . cholecalciferol  1,000 Units Oral Q supper  . clopidogrel  75 mg Oral Daily  . docusate sodium  100 mg Oral BID  . enoxaparin (LOVENOX) injection  60 mg Subcutaneous Q24H  . escitalopram   20 mg Oral Daily  . ferrous sulfate  325 mg Oral BID WC  . insulin aspart  0-20 Units Subcutaneous TID WC  . insulin aspart  0-5 Units Subcutaneous QHS  . insulin aspart  16 Units Subcutaneous TID WC  . insulin glargine  45 Units Subcutaneous BID  .  levothyroxine  50 mcg Oral QAC breakfast  . magnesium oxide  400 mg Oral Daily  . pantoprazole  40 mg Oral Daily  . predniSONE  50 mg Oral Q breakfast  . pregabalin  150 mg Oral BID  . traZODone  100 mg Oral QHS   Continuous Infusions: . sodium chloride    . remdesivir 100 mg in NS 100 mL 100 mg (03/10/20 0821)     LOS: 3 days    Time spent: 39 minutes spent on chart review, discussion with nursing staff, consultants, updating family and interview/physical exam; more than 50% of that time was spent in counseling and/or coordination of care.    Alvira Philips [place], DO [facility] Available via Epic secure chat 7am-7pm After these hours, please refer to coverage provider listed on [email or web address] 03/10/2020, 12:55 PM

## 2020-03-11 LAB — COMPREHENSIVE METABOLIC PANEL
ALT: 10 U/L (ref 0–44)
AST: 12 U/L — ABNORMAL LOW (ref 15–41)
Albumin: 2.1 g/dL — ABNORMAL LOW (ref 3.5–5.0)
Alkaline Phosphatase: 81 U/L (ref 38–126)
Anion gap: 9 (ref 5–15)
BUN: 85 mg/dL — ABNORMAL HIGH (ref 8–23)
CO2: 27 mmol/L (ref 22–32)
Calcium: 8.2 mg/dL — ABNORMAL LOW (ref 8.9–10.3)
Chloride: 97 mmol/L — ABNORMAL LOW (ref 98–111)
Creatinine, Ser: 2.01 mg/dL — ABNORMAL HIGH (ref 0.44–1.00)
GFR, Estimated: 25 mL/min — ABNORMAL LOW (ref >60)
Glucose, Bld: 210 mg/dL — ABNORMAL HIGH (ref 70–99)
Potassium: 4.1 mmol/L (ref 3.5–5.1)
Sodium: 133 mmol/L — ABNORMAL LOW (ref 135–145)
Total Bilirubin: 0.7 mg/dL (ref 0.3–1.2)
Total Protein: 4.9 g/dL — ABNORMAL LOW (ref 6.5–8.1)

## 2020-03-11 LAB — GLUCOSE, CAPILLARY
Glucose-Capillary: 146 mg/dL — ABNORMAL HIGH (ref 70–99)
Glucose-Capillary: 133 mg/dL — ABNORMAL HIGH (ref 70–99)
Glucose-Capillary: 208 mg/dL — ABNORMAL HIGH (ref 70–99)
Glucose-Capillary: 337 mg/dL — ABNORMAL HIGH (ref 70–99)

## 2020-03-11 LAB — C-REACTIVE PROTEIN: CRP: 3.8 mg/dL — ABNORMAL HIGH (ref <1.0)

## 2020-03-11 LAB — D-DIMER, QUANTITATIVE: D-Dimer, Quant: 1.25 ug/mL-FEU — ABNORMAL HIGH (ref 0.00–0.50)

## 2020-03-11 MED ORDER — MAGNESIUM CITRATE PO SOLN
1.0000 | Freq: Once | ORAL | Status: AC
  Administered 2020-03-11: 1 via ORAL
  Filled 2020-03-11: qty 296

## 2020-03-11 MED ORDER — INSULIN GLARGINE 100 UNIT/ML ~~LOC~~ SOLN
52.0000 [IU] | Freq: Two times a day (BID) | SUBCUTANEOUS | Status: DC
  Administered 2020-03-11 (×2): 52 [IU] via SUBCUTANEOUS
  Filled 2020-03-11 (×4): qty 0.52

## 2020-03-11 MED ORDER — SENNOSIDES-DOCUSATE SODIUM 8.6-50 MG PO TABS
2.0000 | ORAL_TABLET | Freq: Two times a day (BID) | ORAL | Status: DC
  Administered 2020-03-11 – 2020-03-13 (×5): 2 via ORAL
  Filled 2020-03-11 (×5): qty 2

## 2020-03-11 NOTE — Progress Notes (Signed)
SATURATION QUALIFICATIONS: (This note is used to comply with regulatory documentation for home oxygen)  Patient Saturations on Room Air at Rest = 90%  Patient Saturations on Room Air while Ambulating = 84%  Patient Saturations on 2 Liters of oxygen while Ambulating = 96%  Please briefly explain why patient needs home oxygen: Dyspnea on exertion noted during ambulation

## 2020-03-11 NOTE — Evaluation (Signed)
Physical Therapy Evaluation Patient Details Name: Susan [name] MRN: [id number] DOB: April 27, 1951 Today's Date: 03/11/2020   History of Present Illness  69 yo female presenting with shortness of breath, chest pain, generalized weakness, vomiting, and fevers. Tested COVID positive. Unable to tolerate home medication due to vomiting. PMH including CHF, HTN, anemia, CAD, depression, stroke, and diabetes.  Clinical Impression   Pt admitted with above diagnosis. Comes from home where she lives with her husband in a single level home with a ramp; Presents to PT with incr oxygen requirement with activity, decr activity tolerance, decr functional independence;  Pt currently with functional limitations due to the deficits listed below (see PT Problem List). Pt will benefit from skilled PT to increase their independence and safety with mobility to allow discharge to the venue listed below.       Follow Up Recommendations Home health PT    Equipment Recommendations  None recommended by PT    Recommendations for Other Services       Precautions / Restrictions Precautions Precautions: Fall Precaution Comments: Watch SpO2      Mobility  Bed Mobility                  Transfers Overall transfer level: Needs assistance Equipment used: Rolling walker (2 wheeled) Transfers: Sit to/from Stand Sit to Stand: Min assist         General transfer comment: Min assist for initial power up; cues for hand placement and safety  Ambulation/Gait Ambulation/Gait assistance: Min assist;Mod assist Gait Distance (Feet): 36 Feet (18x2) Assistive device: Rolling walker (2 wheeled) Gait Pattern/deviations: Step-through pattern;Decreased step length - right;Decreased step length - left Gait velocity: slowed   General Gait Details: Cues to self-monitor for activity tolerance; fatigued at end of walk with one loss of balance requiring light mod assist to prevent fall  Stairs             Wheelchair Mobility    Modified Rankin (Stroke Patients Only)       Balance     Sitting balance-Leahy Scale: Fair       Standing balance-Leahy Scale: Poor                               Pertinent Vitals/Pain Pain Assessment: No/denies pain    Home Living Family/patient expects to be discharged to:: Private residence Living Arrangements: Spouse/significant other Available Help at Discharge: Family;Available 24 hours/day Type of Home: Mobile home Home Access: Ramped entrance     Home Layout: One level Home Equipment: Grab bars - tub/shower;Walker - 2 wheels;Walker - 4 wheels;Wheelchair - manual;Bedside commode;Cane - quad      Prior Function Level of Independence: Needs assistance   Gait / Transfers Assistance Needed: Uses cane  ADL's / Homemaking Assistance Needed: Performs BADLs and uses AE for LB ADLs. Wear slip on shoes. Son helps with compression socks.   Comments: Pt reports that prior to COVID (~1 week ago), she was performing all ADLs and IADLs including driving. Past weak she has been very fatigued.     Hand Dominance   Dominant Hand: Right    Extremity/Trunk Assessment   Upper Extremity Assessment Upper Extremity Assessment: Defer to OT evaluation    Lower Extremity Assessment Lower Extremity Assessment: Generalized weakness    Cervical / Trunk Assessment Cervical / Trunk Assessment: Other exceptions Cervical / Trunk Exceptions: Increased body habitus  Communication   Communication: No difficulties  Cognition Arousal/Alertness:  Awake/alert Behavior During Therapy: WFL for tasks assessed/performed Overall Cognitive Status: Within Functional Limits for tasks assessed                                 General Comments: Very motivated; also fearful of falling      General Comments General comments (skin integrity, edema, etc.): initated walking on Room Air, and O2 sats decr to 86%; came back to high 80s/low 90s with  suppmental O2 2 L    Exercises     Assessment/Plan    PT Assessment Patient needs continued PT services  PT Problem List Decreased strength;Decreased activity tolerance;Decreased balance;Decreased mobility;Decreased coordination;Decreased knowledge of use of DME;Decreased safety awareness;Decreased knowledge of precautions;Cardiopulmonary status limiting activity       PT Treatment Interventions DME instruction;Gait training;Functional mobility training;Therapeutic activities;Therapeutic exercise;Balance training;Patient/family education    PT Goals (Current goals can be found in the Care Plan section)  Acute Rehab PT Goals Patient Stated Goal: Go home PT Goal Formulation: With patient    Frequency Min 3X/week   Barriers to discharge        Co-evaluation               AM-PAC PT "6 Clicks" Mobility  Outcome Measure Help needed turning from your back to your side while in a flat bed without using bedrails?: A Little Help needed moving from lying on your back to sitting on the side of a flat bed without using bedrails?: A Little Help needed moving to and from a bed to a chair (including a wheelchair)?: A Little Help needed standing up from a chair using your arms (e.g., wheelchair or bedside chair)?: A Little Help needed to walk in hospital room?: A Little Help needed climbing 3-5 steps with a railing? : A Little 6 Click Score: 18    End of Session Equipment Utilized During Treatment: Gait belt;Oxygen Activity Tolerance: Patient tolerated treatment well Patient left: in chair;with call bell/phone within reach Nurse Communication: Mobility status PT Visit Diagnosis: Unsteadiness on feet (R26.81);Other abnormalities of gait and mobility (R26.89);History of falling (Z91.81) (Decr functional capacity)    Time: 9702-6378 PT Time Calculation (min) (ACUTE ONLY): 33 min   Charges:   PT Evaluation $PT Eval Moderate Complexity: 1 Mod PT Treatments $Gait Training: 8-22  mins        Roney Marion, PT  Acute Rehabilitation Services Pager [phone] Office [place] 03/11/2020, 7:49 PM

## 2020-03-11 NOTE — Progress Notes (Signed)
PROGRESS NOTE    Susan [name] [facility]  ZOX:[id number] DOB: 12-04-1950 DOA: 03/07/2020 PCP: Salley Scarlet, MD    Brief Narrative:  Susan [name] is a 69 year old female with past medical history notable for chronic diastolic congestive heart failure, essential hypertension, type 2 diabetes mellitus, CKD stage IV, obesity, peripheral vascular disease, chronic neuropathy who presented to the ED via EMS with complaints of progressive shortness of breath, cough, generalized weakness with nausea and vomiting.  Patient reports symptom onset 5 days prior, associated with dyspnea on exertion and nonproductive cough.  Patient was seen by PCP and found to be hypoxic with SPO2 82% on room air.  Patient also reports weight gain over the past several weeks with increased swelling in her lower extremities.  Patient has been vaccine against Covid-19 which were administered in March and April 2021.  No recent sick contacts, no known Covid-19 exposures.  In the ED, Covid-19 PCR positive.  Noted to have an elevated BNP with chest x-ray findings of increased interstitial markings.  TRH consulted for admission for further evaluation and treatment of acute hypoxic restaurant failure secondary to CHF exacerbation and Covid-19 viral pneumonia.   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  Chest x-ray with bilateral increased  interstitial markings consistent with edema versus viral/atypical respiratory infection.  Covid-19 PCR positive.  Patient was hypoxic at PCP office with SPO2 82% on room air. --COVID test: + 03/07/2020 --CRP 10.7>14.3>10.5>5.8>3.8 --ddimer 1.79>1.72>1.49>1.27>1.25 --Remdesivir, plan 5-day course (Day #5/5) --Prednisone 50mg  PO daily  --prone for 2-3hrs every 12hrs if able --Continue supplemental oxygen, titrate to maintain SPO2 greater than 92%, currently on 2 L nasal cannula with SPO2 95% --Continue supportive care with albuterol MDI prn, vitamin C, zinc, Tylenol, antitussives (benzonatate/ Mucinex/Tussionex) --Follow CBC, CMP, D-dimer, ferritin, and CRP daily --Continue airborne/contact isolation precautions for 3 weeks from the day of diagnosis --Home O2 desaturation screen today noted will need home O2 with 2 L per nasal cannula with ambulation  The treatment plan and use of medications and known side effects were discussed with patient/family. Some of the medications used are based on case reports/anecdotal data.  All other medications being used in the management of COVID-19 based on limited study data.  Complete risks and long-term side effects are unknown, however in the best clinical judgment they seem to be of some benefit.  Patient wanted to proceed with treatment options provided.  Acute on chronic diastolic congestive heart failure, decompensated Hx essential hypertension Recent TTE 02/08/2020 with LVEF 55-60%, indeterminant diastolic dysfunction previously diagnosed with grade 1 diastolic dysfunction on TTE on 03/19/2018.  BNP elevated 1301 with chest x-ray findings of bilateral increased pulmonary interstitial markings consistent with edema. --Discontinue Lasix today --Carvedilol 5 mg twice daily --Strict I's and O's and daily weights  Type 2 diabetes mellitus Hemoglobin A1c 11.8 on 12/21/2019, poorly controlled.  On NPH 10-15 units subcutaneously qAM and Novolin  R sliding scale.   Elevated blood sugars during hospitalization likely direct result of high-dose IV steroid use. --Hemoglobin A1c 11.0 03/09/2020 --Increase Lantus to 52u [place] BID --Novolog to 16u TIDAC --Resistant insulin sliding scale for further coverage --CBGs before every meal/at bedtime  CKD stage IV Creatinine 1.88 on admission, stable. --Cr 1.88>1.95>2.27>2.05>2.01 --Avoid nephrotoxins, renally dose all medications --Continue monitor BMP daily in the setting of aggressive IV diuresis as above closely  CAD --Continue aspirin 81 mg p.o. daily, Plavix 75 mg p.o. daily  Iron deficiency anemia --Continue ferrous sulfate 325 mg p.o. twice daily  Anxiety/depression: Continue Lexapro 20 g p.o. daily  Gout: Continue allopurinol 100 mg p.o. twice daily  Hypothyroidism --Levothyroxine 50 mcg p.o. daily  Neuropathy: Lyrica 150 mg p.o. twice daily  Obesity Body mass index is 44.09 kg/m.  Counseled on need for aggressive lifestyle changes/weight loss as this complicates all facets of care.  Weakness, deconditioning: --OT recommends home health OT on discharge --Pending PT evaluation   DVT prophylaxis: Lovenox Code Status: Full code Family Communication: Updated patient extensively at bedside  Disposition Plan:  Status is: Inpatient  Remains inpatient appropriate because:Ongoing diagnostic testing needed not appropriate for outpatient work up, Unsafe d/c plan, IV treatments appropriate due to intensity of illness or inability to take PO and Inpatient level of care appropriate due to severity of illness   Dispo: The patient is from: Home              Anticipated d/c is to: Home              Anticipated d/c date is: 2 days              Patient currently is not medically stable to d/c.   Consultants:   None  Procedures:   None  Antimicrobials:   None   Subjective: Patient seen and examined bedside, resting comfortably.  Breathing improved, but continues on 2 L nasal cannula.   Home O2 desaturation screen today notable for hypoxia on room air with SPO2 84%, on 2 L nasal cannula 96%; will need home O2.  Glucose now better controlled with increased Lantus and de-escalation of IV steroids to oral steroids.  Also continues with weakness and fatigue, awaiting PT evaluation.  No other complaints or concerns at this time. Denies headache, no dizziness, no chest pain, no palpitations, no abdominal pain, no fever/chills/night sweats, no nausea/vomiting/diarrhea, no abdominal pain, no paresthesias.  No acute events overnight per nursing staff.  Objective: Vitals:   03/11/20 0200 03/11/20 0300 03/11/20 0400 03/11/20 0847  BP:   (!) 135/56 134/77  Pulse: 61 (!) 58 63 71  Resp: 18 19 16 15   Temp:   98.5 F (36.9 C) 98 F (36.7 C)  TempSrc:   Oral Oral  SpO2: 94% 93% 94% 94%  Weight:      Height:        Intake/Output Summary (Last 24 hours) at 03/11/2020 1225 Last data filed at 03/10/2020 2000 Gross per 24 hour  Intake 350 ml  Output 400 ml  Net -50 ml   Filed Weights   03/08/20 0421 03/09/20 0430 03/10/20 0500  Weight: 121.5 kg 124.8 kg 123.9 kg    Examination:  General exam: Appears calm and comfortable  Respiratory system: Clear to auscultation. Respiratory effort normal.  On 2 L nasal cannula with SPO2 94%. Cardiovascular system: S1 & S2 heard, RRR. No JVD, murmurs, rubs, gallops or clicks. No pedal edema. Gastrointestinal system: Abdomen is nondistended, soft  and nontender. No organomegaly or masses felt. Normal bowel sounds heard. Central nervous system: Alert and oriented. No focal neurological deficits. Extremities: Symmetric 5 x 5 power. Skin: No rashes, lesions or ulcers Psychiatry: Judgement and insight appear normal. Mood & affect appropriate.     Data Reviewed: I have personally reviewed following labs and imaging studies  CBC: Recent Labs  Lab 03/07/20 1816 03/08/20 0214  WBC 5.4 3.8*  NEUTROABS 4.7  --   HGB 9.8* 9.7*  HCT 32.7* 33.4*    MCV 87.2 89.1  PLT 195 180   Basic Metabolic Panel: Recent Labs  Lab 03/07/20 1816 03/08/20 0214 03/09/20 0055 03/10/20 0500 03/11/20 0409  NA 137 139 127* 128* 133*  K 3.6 4.7 4.6 4.1 4.1  CL 99 101 93* 93* 97*  CO2 27 27 23 24 27   GLUCOSE 164* 233* 578* 418* 210*  BUN 49* 51* 72* 84* 85*  CREATININE 1.88* 1.95* 2.27* 2.05* 2.01*  CALCIUM 8.7* 8.6* 7.9* 8.1* 8.2*   GFR: Estimated Creatinine Clearance: 35 mL/min (A) (by C-G formula based on SCr of 2.01 mg/dL (H)). Liver Function Tests: Recent Labs  Lab 03/08/20 0214 03/09/20 0055 03/10/20 0500 03/11/20 0409  AST 19 18 14* 12*  ALT 16 14 13 10   ALKPHOS 112 99 88 81  BILITOT 0.6 0.5 0.5 0.7  PROT 5.9* 5.2* 5.1* 4.9*  ALBUMIN 2.6* 2.4* 2.3* 2.1*   No results for input(s): LIPASE, AMYLASE in the last 168 hours. No results for input(s): AMMONIA in the last 168 hours. Coagulation Profile: No results for input(s): INR, PROTIME in the last 168 hours. Cardiac Enzymes: No results for input(s): CKTOTAL, CKMB, CKMBINDEX, TROPONINI in the last 168 hours. BNP (last 3 results) No results for input(s): PROBNP in the last 8760 hours. HbA1C: Recent Labs    03/09/20 0055  HGBA1C 11.0*   CBG: Recent Labs  Lab 03/10/20 1221 03/10/20 1558 03/10/20 2027 03/11/20 0818 03/11/20 1140  GLUCAP 350* 304* 269* 146* 133*   Lipid Profile: No results for input(s): CHOL, HDL, LDLCALC, TRIG, CHOLHDL, LDLDIRECT in the last 72 hours. Thyroid Function Tests: No results for input(s): TSH, T4TOTAL, FREET4, T3FREE, THYROIDAB in the last 72 hours. Anemia Panel: No results for input(s): VITAMINB12, FOLATE, FERRITIN, TIBC, IRON, RETICCTPCT in the last 72 hours. Sepsis Labs: Recent Labs  Lab 03/07/20 1816 03/07/20 1933  PROCALCITON  --  0.31  LATICACIDVEN 1.0 0.9    Recent Results (from the past 240 hour(s))  Respiratory Panel by RT PCR (Flu A&B, Covid) - Nasopharyngeal Swab     Status: Abnormal   Collection Time: 03/07/20  5:01 PM    Specimen: Nasopharyngeal Swab  Result Value Ref Range Status   SARS Coronavirus 2 by RT PCR POSITIVE (A) NEGATIVE Final    Comment: RESULT CALLED TO, READ BACK BY AND VERIFIED WITH: [name] RN 03/07/20 AT 1923 SK (NOTE) SARS-CoV-2 target nucleic acids are DETECTED.  SARS-CoV-2 RNA is generally detectable in upper respiratory specimens  during the acute phase of infection. Positive results are indicative of the presence of the identified virus, but do not rule out bacterial infection or co-infection with other pathogens not detected by the test. Clinical correlation with patient history and other diagnostic information is necessary to determine patient infection status. The expected result is Negative.  Fact Sheet for Patients:  https://www.moore.com/  Fact Sheet for Healthcare Providers: https://www.young.biz/  This test is not yet approved or cleared by the [place] and  has been authorized  for detection and/or diagnosis of SARS-CoV-2 by FDA under an Emergency Use Authorization (EUA).  This EUA will remain in effect (meaning this test can be use d) for the duration of  the COVID-19 declaration under Section 564(b)(1) of the Act, 21 U.S.C. section 360bbb-3(b)(1), unless the authorization is terminated or revoked sooner.      Influenza A by PCR NEGATIVE NEGATIVE Final   Influenza B by PCR NEGATIVE NEGATIVE Final    Comment: (NOTE) The Xpert Xpress SARS-CoV-2/FLU/RSV assay is intended as an aid in  the diagnosis of influenza from Nasopharyngeal swab specimens and  should not be used as a sole basis for treatment. Nasal washings and  aspirates are unacceptable for Xpert Xpress SARS-CoV-2/FLU/RSV  testing.  Fact Sheet for Patients: https://www.moore.com/  Fact Sheet for Healthcare Providers: https://www.young.biz/  This test is not yet approved or cleared by the [place] FDA and   has been authorized for detection and/or diagnosis of SARS-CoV-2 by  FDA under an Emergency Use Authorization (EUA). This EUA will remain  in effect (meaning this test can be used) for the duration of the  Covid-19 declaration under Section 564(b)(1) of the Act, 21  U.S.C. section 360bbb-3(b)(1), unless the authorization is  terminated or revoked. Performed at [facility], [address] [place], [place] [address]   Blood culture (routine x 2)     Status: None (Preliminary result)   Collection Time: 03/07/20  5:07 PM   Specimen: Site Not Specified; Blood  Result Value Ref Range Status   Specimen Description SITE NOT SPECIFIED  Final   Special Requests   Final    BOTTLES DRAWN AEROBIC AND ANAEROBIC Blood Culture results may not be optimal due to an excessive volume of blood received in culture bottles   Culture   Final    NO GROWTH 4 DAYS Performed at [facility], [address], [place], [place] [address]    Report Status PENDING  Incomplete  Blood culture (routine x 2)     Status: None (Preliminary result)   Collection Time: 03/07/20  6:16 PM   Specimen: BLOOD RIGHT HAND  Result Value Ref Range Status   Specimen Description BLOOD RIGHT HAND  Final   Special Requests   Final    BOTTLES DRAWN AEROBIC AND ANAEROBIC Blood Culture adequate volume   Culture   Final    NO GROWTH 4 DAYS Performed at [facility], [address] [place], [place] [address]    Report Status PENDING  Incomplete  Culture, Urine     Status: Abnormal   Collection Time: 03/08/20  6:22 PM   Specimen: Urine, Clean Catch  Result Value Ref Range Status   Specimen Description URINE, CLEAN CATCH  Final   Special Requests   Final    NONE Performed at [facility], [address], [place], [place] [address]    Culture MULTIPLE SPECIES PRESENT, SUGGEST RECOLLECTION (A)  Final   Report Status 03/09/2020 FINAL  Final         Radiology Studies: No results  found.      Scheduled Meds: . allopurinol  100 mg Oral BID  . aspirin EC  81 mg Oral q morning - 10a  . carvedilol  12.5 mg Oral BID WC  . cholecalciferol  1,000 Units Oral Q supper  . clopidogrel  75 mg Oral Daily  . docusate sodium  100 mg Oral BID  . enoxaparin (LOVENOX) injection  60 mg Subcutaneous  Q24H  . escitalopram  20 mg Oral Daily  . ferrous sulfate  325 mg Oral BID WC  . insulin aspart  0-20 Units Subcutaneous TID WC  . insulin aspart  0-5 Units Subcutaneous QHS  . insulin aspart  16 Units Subcutaneous TID WC  . insulin glargine  52 Units Subcutaneous BID  . levothyroxine  50 mcg Oral QAC breakfast  . magnesium oxide  400 mg Oral Daily  . pantoprazole  40 mg Oral Daily  . predniSONE  50 mg Oral Q breakfast  . pregabalin  150 mg Oral BID  . senna-docusate  2 tablet Oral BID  . traZODone  100 mg Oral QHS   Continuous Infusions: . sodium chloride       LOS: 4 days    Time spent: 39 minutes spent on chart review, discussion with nursing staff, consultants, updating family and interview/physical exam; more than 50% of that time was spent in counseling and/or coordination of care.    Alvira Philips [place], DO [facility] Available via Epic secure chat 7am-7pm After these hours, please refer to coverage provider listed on [email or web address] 03/11/2020, 12:25 PM

## 2020-03-12 ENCOUNTER — Encounter (HOSPITAL_COMMUNITY): Payer: PPO

## 2020-03-12 LAB — D-DIMER, QUANTITATIVE: D-Dimer, Quant: 1.84 ug/mL-FEU — ABNORMAL HIGH (ref 0.00–0.50)

## 2020-03-12 LAB — CULTURE, BLOOD (ROUTINE X 2)
Culture: NO GROWTH
Culture: NO GROWTH
Special Requests: ADEQUATE

## 2020-03-12 LAB — GLUCOSE, CAPILLARY
Glucose-Capillary: 162 mg/dL — ABNORMAL HIGH (ref 70–99)
Glucose-Capillary: 98 mg/dL (ref 70–99)
Glucose-Capillary: 222 mg/dL — ABNORMAL HIGH (ref 70–99)
Glucose-Capillary: 267 mg/dL — ABNORMAL HIGH (ref 70–99)

## 2020-03-12 LAB — COMPREHENSIVE METABOLIC PANEL
ALT: 11 U/L (ref 0–44)
AST: 15 U/L (ref 15–41)
Albumin: 2.2 g/dL — ABNORMAL LOW (ref 3.5–5.0)
Alkaline Phosphatase: 79 U/L (ref 38–126)
Anion gap: 12 (ref 5–15)
BUN: 85 mg/dL — ABNORMAL HIGH (ref 8–23)
CO2: 23 mmol/L (ref 22–32)
Calcium: 8.4 mg/dL — ABNORMAL LOW (ref 8.9–10.3)
Chloride: 103 mmol/L (ref 98–111)
Creatinine, Ser: 2.11 mg/dL — ABNORMAL HIGH (ref 0.44–1.00)
GFR, Estimated: 23 mL/min — ABNORMAL LOW (ref >60)
Glucose, Bld: 116 mg/dL — ABNORMAL HIGH (ref 70–99)
Potassium: 4 mmol/L (ref 3.5–5.1)
Sodium: 138 mmol/L (ref 135–145)
Total Bilirubin: 0.6 mg/dL (ref 0.3–1.2)
Total Protein: 5.1 g/dL — ABNORMAL LOW (ref 6.5–8.1)

## 2020-03-12 LAB — C-REACTIVE PROTEIN: CRP: 1.9 mg/dL — ABNORMAL HIGH (ref <1.0)

## 2020-03-12 MED ORDER — POLYETHYLENE GLYCOL 3350 17 G PO PACK
17.0000 g | PACK | Freq: Two times a day (BID) | ORAL | Status: DC
  Administered 2020-03-12 – 2020-03-13 (×2): 17 g via ORAL
  Filled 2020-03-12 (×3): qty 1

## 2020-03-12 MED ORDER — PREDNISONE 20 MG PO TABS
40.0000 mg | ORAL_TABLET | Freq: Every day | ORAL | Status: DC
  Administered 2020-03-12 – 2020-03-13 (×2): 40 mg via ORAL
  Filled 2020-03-12 (×2): qty 2

## 2020-03-12 MED ORDER — MAGNESIUM CITRATE PO SOLN
1.0000 | Freq: Once | ORAL | Status: AC
  Administered 2020-03-12: 1 via ORAL
  Filled 2020-03-12: qty 296

## 2020-03-12 MED ORDER — INSULIN ASPART 100 UNIT/ML ~~LOC~~ SOLN
20.0000 [IU] | Freq: Three times a day (TID) | SUBCUTANEOUS | Status: DC
  Administered 2020-03-12 – 2020-03-13 (×4): 20 [IU] via SUBCUTANEOUS

## 2020-03-12 MED ORDER — INSULIN GLARGINE 100 UNIT/ML ~~LOC~~ SOLN
56.0000 [IU] | Freq: Two times a day (BID) | SUBCUTANEOUS | Status: DC
  Administered 2020-03-12 – 2020-03-13 (×3): 56 [IU] via SUBCUTANEOUS
  Filled 2020-03-12 (×4): qty 0.56

## 2020-03-12 NOTE — Progress Notes (Signed)
SATURATION QUALIFICATIONS: (This note is used to comply with regulatory documentation for home oxygen)  Patient Saturations on Room Air at Rest = 94%  Patient Saturations on Room Air while Ambulating = 85%  Patient Saturations on 2 Liters of oxygen while Ambulating = 97%  Please briefly explain why patient needs home oxygen: Patient desats to mid 80s while ambulating.

## 2020-03-12 NOTE — Plan of Care (Signed)
Patient is on 2L [place] with sats WNL. The plan is for the patient to be discharged tomorrow 10/19. Patient transferred from chair to bed safely with walker and 1 assist. No apparent distress but did give cough medicine with nighttime meds for relief. Still waiting to have a BM but gave stool softeners as ordered - she refused Miralax. Will continue to monitor and continue current POC.

## 2020-03-12 NOTE — TOC Progression Note (Signed)
Transition of Care [facility]) - Progression Note    Patient Details  Name: Susan [name] MRN: [id number] Date of Birth: 15-Apr-1951  [facility] CM/SW Contact  Curlene Labrum, RN Phone Number: 03/12/2020, 2:56 PM  Clinical Narrative:    Case management spoke with the patient's husband, Susan [name] on the phone regarding transitions of care pending tomorrow's possible discharge to home.  The patient currently lives with her husband, already uses home O2 at 2 L/min through [facility], and has home health services through [place], PT.  I called Kingsley Spittle, RNCM with [facility] and message was left on her voicemail regarding patient's possible discharge tomorrow and continued need for home health PT, RN, and OT services.  Patient does not need additional dme at home at this time.  Will continue to follow for discharge needs.   Expected Discharge Plan: [place] Barriers to Discharge: Continued Medical Work up  Expected Discharge Plan and Services Expected Discharge Plan: [place]   Discharge Planning Services: CM Consult Post Acute Care Choice: Home Health, Durable Medical Equipment Living arrangements for the past 2 months: Single Family Home                   DME Agency: [place]       HH Arranged: RN, PT, OT [facility] Agency: [place] [facility]) Date HH Agency Contacted: 03/12/20 Time [place]: [place] Representative spoke with at [place]: Kingsley Spittle, [place] with [place] Determinants of Health (SDOH) Interventions    Readmission Risk Interventions Readmission Risk Prevention Plan 03/12/2020 02/09/2020 02/08/2020  Transportation Screening Complete Complete Complete  Medication Review Press photographer) Complete Complete Complete  PCP or Specialist appointment within 3-5 days of discharge Complete Complete -  [place] or [place] Complete Complete Complete  SW Recovery Care/Counseling Consult Complete Complete Complete  Palliative Care Screening Not Applicable Not Applicable Not [place] Not Applicable Not Applicable Not Applicable  Some recent data might be hidden

## 2020-03-12 NOTE — Care Management Important Message (Signed)
Important Message  Patient Details  Name: Sacred Roa MRN: [id number] Date of Birth: [birth year]   Medicare Important Message Given:  Yes - Important Message mailed due to current National Emergency  Verbal consent obtained due to current National Emergency  Relationship to patient: Self Contact Name: Roslyn Else Call Date: 03/12/20  Time: 1130 Phone: [phone] Outcome: No Answer/Busy Important Message mailed to: Patient address on file    Delorse Lek 03/12/2020, 11:31 AM

## 2020-03-12 NOTE — Progress Notes (Signed)
PROGRESS NOTE    Susan [name]  WCB:[id number] DOB: 1951-05-22 DOA: 03/07/2020 PCP: Salley Scarlet, MD    Brief Narrative:  Susan [name] is a 69 year old female with past medical history notable for chronic diastolic congestive heart failure, essential hypertension, type 2 diabetes mellitus, CKD stage IV, obesity, peripheral vascular disease, chronic neuropathy who presented to the ED via EMS with complaints of progressive shortness of breath, cough, generalized weakness with nausea and vomiting.  Patient reports symptom onset 5 days prior, associated with dyspnea on exertion and nonproductive cough.  Patient was seen by PCP and found to be hypoxic with SPO2 82% on room air.  Patient also reports weight gain over the past several weeks with increased swelling in her lower extremities.  Patient has been vaccine against Covid-19 which were administered in March and April 2021.  No recent sick contacts, no known Covid-19 exposures.  In the ED, Covid-19 PCR positive.  Noted to have an elevated BNP with chest x-ray findings of increased interstitial markings.  TRH consulted for admission for further evaluation and treatment of acute hypoxic restaurant failure secondary to CHF exacerbation and Covid-19 viral pneumonia.   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  Chest x-ray with bilateral increased  interstitial markings consistent with edema versus viral/atypical respiratory infection.  Covid-19 PCR positive.  Patient was hypoxic at PCP office with SPO2 82% on room air. --COVID test: + 03/07/2020 --CRP 10.7>14.3>10.5>5.8>3.8>1.9 --ddimer 1.79>1.72>1.49>1.27>1.25>1.84 --Completed 5-day course of remdesivir are on 03/11/2020 --Prednisone 50mg  PO daily, decrease to 40 mg p.o. daily on 03/13/2020 --prone for 2-3hrs every 12hrs if able --Continue supplemental oxygen, titrate to maintain SPO2 greater than 92%, currently on 2 L nasal cannula with SPO2 95% --Continue supportive care with albuterol MDI prn, vitamin C, zinc, Tylenol, antitussives (benzonatate/ Mucinex/Tussionex) --Follow CBC, CMP, D-dimer, ferritin, and CRP daily --Continue airborne/contact isolation precautions for 3 weeks from the day of diagnosis --Home O2 desaturation screen 10/17 noted will need home O2 with 2 L per nasal cannula with ambulation  The treatment plan and use of medications and known side effects were discussed with patient/family. Some of the medications used are based on case reports/anecdotal data.  All other medications being used in the management of COVID-19 based on limited study data.  Complete risks and long-term side effects are unknown, however in the best clinical judgment they seem to be of some benefit.  Patient wanted to proceed with treatment options provided.  Acute on chronic diastolic congestive heart failure, decompensated Hx essential hypertension Recent TTE 02/08/2020 with LVEF 55-60%, indeterminant diastolic dysfunction previously diagnosed with grade 1 diastolic dysfunction on TTE on 03/19/2018.  BNP elevated 1301 with chest x-ray findings of bilateral increased pulmonary interstitial markings consistent with edema. --Carvedilol 5 mg twice daily --Strict I's and O's and daily weights  Type 2 diabetes mellitus Hemoglobin A1c 11.8 on 12/21/2019, poorly controlled.  On NPH  10-15 units  subcutaneously qAM and Novolin R sliding scale.  Elevated blood sugars during hospitalization likely direct result of high-dose IV steroid use. --Hemoglobin A1c 11.0 03/09/2020 --Increase Lantus to 56u  BID --Novolog to 15u TIDAC --Resistant insulin sliding scale for further coverage --CBGs before every meal/at bedtime  CKD stage IV Creatinine 1.88 on admission, stable. --Cr 1.88>1.95>2.27>2.05>2.01>2.11 --Avoid nephrotoxins, renally dose all medications --Continue monitor BMP daily in the setting of aggressive IV diuresis as above closely  CAD --Continue aspirin 81 mg p.o. daily, Plavix 75 mg p.o. daily  Iron deficiency anemia --Continue ferrous sulfate 325 mg p.o. twice daily  Anxiety/depression: Continue Lexapro 20 g p.o. daily  Gout: Continue allopurinol 100 mg p.o. twice daily  Hypothyroidism --Levothyroxine 50 mcg p.o. daily  Neuropathy: Lyrica 150 mg p.o. twice daily  Obesity Body mass index is 44.09 kg/m.  Counseled on need for aggressive lifestyle changes/weight loss as this complicates all facets of care.  Weakness, deconditioning: --OT recommends home health OT on discharge --Pending PT evaluation   DVT prophylaxis: Lovenox Code Status: Full code Family Communication: Updated patient extensively at bedside  Disposition Plan:  Status is: Inpatient  Remains inpatient appropriate because:Ongoing diagnostic testing needed not appropriate for outpatient work up, Unsafe d/c plan, IV treatments appropriate due to intensity of illness or inability to take PO and Inpatient level of care appropriate due to severity of illness   Dispo: The patient is from: Home              Anticipated d/c is to: Home              Anticipated d/c date is: 2 days              Patient currently is not medically stable to d/c.   Consultants:   None  Procedures:   None  Antimicrobials:   None   Subjective: Patient seen and examined bedside, resting comfortably.   Breathing improved, but continues on 2 L nasal cannula.  He is with weakness and fatigue.  Also with significant dyspnea on exertion with requirement of 2 L nasal cannula with ambulation per walk test today.  No other complaints or concerns at this time. Denies headache, no dizziness, no chest pain, no palpitations, no abdominal pain, no fever/chills/night sweats, no nausea/vomiting/diarrhea, no abdominal pain, no paresthesias.  No acute events overnight per nursing staff.  Objective: Vitals:   03/11/20 2000 03/11/20 2016 03/12/20 0504 03/12/20 0800  BP:  (!) 152/59  (!) 163/71  Pulse: 73 71  71  Resp: 20 16  17   Temp:  97.9 F (36.6 C) 97.6 F (36.4 C) 97.8 F (36.6 C)  TempSrc:  Oral  Oral  SpO2: 95% 95%  92%  Weight:      Height:        Intake/Output Summary (Last 24 hours) at 03/12/2020 1247 Last data filed at 03/12/2020 0500 Gross per 24 hour  Intake 300 ml  Output 1750 ml  Net -1450 ml   Filed Weights   03/08/20 0421 03/09/20 0430 03/10/20 0500  Weight: 121.5 kg 124.8 kg 123.9 kg    Examination:  General exam: Appears calm and comfortable  Respiratory system: Clear to auscultation. Respiratory effort normal.  On 2 L nasal cannula with SPO2 95%. Cardiovascular system: S1 & S2 heard, RRR. No JVD, murmurs, rubs, gallops or clicks. No pedal edema. Gastrointestinal system: Abdomen is nondistended, soft and nontender. No organomegaly or masses felt. Normal bowel sounds heard. Central nervous system: Alert  and oriented. No focal neurological deficits. Extremities: Symmetric 5 x 5 power. Skin: No rashes, lesions or ulcers Psychiatry: Judgement and insight appear normal. Mood & affect appropriate.     Data Reviewed: I have personally reviewed following labs and imaging studies  CBC: Recent Labs  Lab 03/07/20 1816 03/08/20 0214  WBC 5.4 3.8*  NEUTROABS 4.7  --   HGB 9.8* 9.7*  HCT 32.7* 33.4*  MCV 87.2 89.1  PLT 195 180   Basic Metabolic Panel: Recent Labs  Lab  03/08/20 0214 03/09/20 0055 03/10/20 0500 03/11/20 0409 03/12/20 1117  NA 139 127* 128* 133* 138  K 4.7 4.6 4.1 4.1 4.0  CL 101 93* 93* 97* 103  CO2 27 23 24 27 23   GLUCOSE 233* 578* 418* 210* 116*  BUN 51* 72* 84* 85* 85*  CREATININE 1.95* 2.27* 2.05* 2.01* 2.11*  CALCIUM 8.6* 7.9* 8.1* 8.2* 8.4*   GFR: Estimated Creatinine Clearance: 33.3 mL/min (A) (by C-G formula based on SCr of 2.11 mg/dL (H)). Liver Function Tests: Recent Labs  Lab 03/08/20 0214 03/09/20 0055 03/10/20 0500 03/11/20 0409 03/12/20 1117  AST 19 18 14* 12* 15  ALT 16 14 13 10 11   ALKPHOS 112 99 88 81 79  BILITOT 0.6 0.5 0.5 0.7 0.6  PROT 5.9* 5.2* 5.1* 4.9* 5.1*  ALBUMIN 2.6* 2.4* 2.3* 2.1* 2.2*   No results for input(s): LIPASE, AMYLASE in the last 168 hours. No results for input(s): AMMONIA in the last 168 hours. Coagulation Profile: No results for input(s): INR, PROTIME in the last 168 hours. Cardiac Enzymes: No results for input(s): CKTOTAL, CKMB, CKMBINDEX, TROPONINI in the last 168 hours. BNP (last 3 results) No results for input(s): PROBNP in the last 8760 hours. HbA1C: No results for input(s): HGBA1C in the last 72 hours. CBG: Recent Labs  Lab 03/11/20 1140 03/11/20 1612 03/11/20 2105 03/12/20 0731 03/12/20 1152  GLUCAP 133* 208* 337* 162* 98   Lipid Profile: No results for input(s): CHOL, HDL, LDLCALC, TRIG, CHOLHDL, LDLDIRECT in the last 72 hours. Thyroid Function Tests: No results for input(s): TSH, T4TOTAL, FREET4, T3FREE, THYROIDAB in the last 72 hours. Anemia Panel: No results for input(s): VITAMINB12, FOLATE, FERRITIN, TIBC, IRON, RETICCTPCT in the last 72 hours. Sepsis Labs: Recent Labs  Lab 03/07/20 1816 03/07/20 1933  PROCALCITON  --  0.31  LATICACIDVEN 1.0 0.9    Recent Results (from the past 240 hour(s))  Respiratory Panel by RT PCR (Flu A&B, Covid) - Nasopharyngeal Swab     Status: Abnormal   Collection Time: 03/07/20  5:01 PM   Specimen: Nasopharyngeal Swab   Result Value Ref Range Status   SARS Coronavirus 2 by RT PCR POSITIVE (A) NEGATIVE Final    Comment: RESULT CALLED TO, READ BACK BY AND VERIFIED WITH: [name] RN 03/07/20 AT 1923 SK (NOTE) SARS-CoV-2 target nucleic acids are DETECTED.  SARS-CoV-2 RNA is generally detectable in upper respiratory specimens  during the acute phase of infection. Positive results are indicative of the presence of the identified virus, but do not rule out bacterial infection or co-infection with other pathogens not detected by the test. Clinical correlation with patient history and other diagnostic information is necessary to determine patient infection status. The expected result is Negative.  Fact Sheet for Patients:  https://www.moore.com/  Fact Sheet for Healthcare Providers: https://www.young.biz/  This test is not yet approved or cleared by the [place] FDA and  has been authorized for detection and/or diagnosis of SARS-CoV-2 by FDA under an  Emergency Use Authorization (EUA).  This EUA will remain in effect (meaning this test can be use d) for the duration of  the COVID-19 declaration under Section 564(b)(1) of the Act, 21 U.S.C. section 360bbb-3(b)(1), unless the authorization is terminated or revoked sooner.      Influenza A by PCR NEGATIVE NEGATIVE Final   Influenza B by PCR NEGATIVE NEGATIVE Final    Comment: (NOTE) The Xpert Xpress SARS-CoV-2/FLU/RSV assay is intended as an aid in  the diagnosis of influenza from Nasopharyngeal swab specimens and  should not be used as a sole basis for treatment. Nasal washings and  aspirates are unacceptable for Xpert Xpress SARS-CoV-2/FLU/RSV  testing.  Fact Sheet for Patients: https://www.moore.com/  Fact Sheet for Healthcare Providers: https://www.young.biz/  This test is not yet approved or cleared by the [place] FDA and  has been authorized for detection  and/or diagnosis of SARS-CoV-2 by  FDA under an Emergency Use Authorization (EUA). This EUA will remain  in effect (meaning this test can be used) for the duration of the  Covid-19 declaration under Section 564(b)(1) of the Act, 21  U.S.C. section 360bbb-3(b)(1), unless the authorization is  terminated or revoked. Performed at [facility], [address], [place], [place] [address]   Blood culture (routine x 2)     Status: None   Collection Time: 03/07/20  5:07 PM   Specimen: Site Not Specified; Blood  Result Value Ref Range Status   Specimen Description SITE NOT SPECIFIED  Final   Special Requests   Final    BOTTLES DRAWN AEROBIC AND ANAEROBIC Blood Culture results may not be optimal due to an excessive volume of blood received in culture bottles   Culture   Final    NO GROWTH 5 DAYS Performed at [name], [address], [place], [place] [address]    Report Status 03/12/2020 FINAL  Final  Blood culture (routine x 2)     Status: None   Collection Time: 03/07/20  6:16 PM   Specimen: BLOOD RIGHT HAND  Result Value Ref Range Status   Specimen Description BLOOD RIGHT HAND  Final   Special Requests   Final    BOTTLES DRAWN AEROBIC AND ANAEROBIC Blood Culture adequate volume   Culture   Final    NO GROWTH 5 DAYS Performed at [name], [address], [place], [place] [address]    Report Status 03/12/2020 FINAL  Final  Culture, Urine     Status: Abnormal   Collection Time: 03/08/20  6:22 PM   Specimen: Urine, Clean Catch  Result Value Ref Range Status   Specimen Description URINE, CLEAN CATCH  Final   Special Requests   Final    NONE Performed at [facility], [address] [place], [place] [address]    Culture MULTIPLE SPECIES PRESENT, SUGGEST RECOLLECTION (A)  Final   Report Status 03/09/2020 FINAL  Final         Radiology Studies: No results found.      Scheduled Meds: . allopurinol  100 mg Oral BID  . aspirin EC  81 mg Oral q  morning - 10a  . carvedilol  12.5 mg Oral BID WC  . cholecalciferol  1,000 Units Oral Q supper  . clopidogrel  75 mg Oral Daily  . docusate sodium  100 mg Oral BID  . enoxaparin (LOVENOX) injection  60 mg Subcutaneous Q24H  . escitalopram  20 mg Oral Daily  . ferrous  sulfate  325 mg Oral BID WC  . insulin aspart  0-20 Units Subcutaneous TID WC  . insulin aspart  0-5 Units Subcutaneous QHS  . insulin aspart  20 Units Subcutaneous TID WC  . insulin glargine  56 Units Subcutaneous BID  . levothyroxine  50 mcg Oral QAC breakfast  . magnesium oxide  400 mg Oral Daily  . pantoprazole  40 mg Oral Daily  . polyethylene glycol  17 g Oral BID  . predniSONE  40 mg Oral Q breakfast  . pregabalin  150 mg Oral BID  . senna-docusate  2 tablet Oral BID  . traZODone  100 mg Oral QHS   Continuous Infusions: . sodium chloride       LOS: 5 days    Time spent: 37 minutes spent on chart review, discussion with nursing staff, consultants, updating family and interview/physical exam; more than 50% of that time was spent in counseling and/or coordination of care.    Alvira Philips [place], DO [facility] Available via Epic secure chat 7am-7pm After these hours, please refer to coverage provider listed on [email or web address] 03/12/2020, 12:47 PM

## 2020-03-13 LAB — BASIC METABOLIC PANEL
Anion gap: 9 (ref 5–15)
BUN: 91 mg/dL — ABNORMAL HIGH (ref 8–23)
CO2: 28 mmol/L (ref 22–32)
Calcium: 8.3 mg/dL — ABNORMAL LOW (ref 8.9–10.3)
Chloride: 99 mmol/L (ref 98–111)
Creatinine, Ser: 1.97 mg/dL — ABNORMAL HIGH (ref 0.44–1.00)
GFR, Estimated: 25 mL/min — ABNORMAL LOW (ref >60)
Glucose, Bld: 135 mg/dL — ABNORMAL HIGH (ref 70–99)
Potassium: 4.4 mmol/L (ref 3.5–5.1)
Sodium: 136 mmol/L (ref 135–145)

## 2020-03-13 LAB — GLUCOSE, CAPILLARY
Glucose-Capillary: 85 mg/dL (ref 70–99)
Glucose-Capillary: 77 mg/dL (ref 70–99)

## 2020-03-13 LAB — C-REACTIVE PROTEIN: CRP: 1.5 mg/dL — ABNORMAL HIGH (ref <1.0)

## 2020-03-13 LAB — D-DIMER, QUANTITATIVE: D-Dimer, Quant: 1.38 ug/mL-FEU — ABNORMAL HIGH (ref 0.00–0.50)

## 2020-03-13 MED ORDER — SENNOSIDES-DOCUSATE SODIUM 8.6-50 MG PO TABS: 2.0000 | ORAL_TABLET | Freq: Two times a day (BID) | ORAL | 0 refills | Status: AC

## 2020-03-13 MED ORDER — PREDNISONE 10 MG PO TABS: ORAL_TABLET | ORAL | 0 refills | Status: AC

## 2020-03-13 MED ORDER — GUAIFENESIN-DM 100-10 MG/5ML PO SYRP: 10.0000 mL | ORAL_SOLUTION | ORAL | 0 refills | Status: DC | PRN

## 2020-03-13 MED ORDER — POLYETHYLENE GLYCOL 3350 17 G PO PACK: 17.0000 g | PACK | Freq: Every day | ORAL | 0 refills | Status: AC | PRN

## 2020-03-13 NOTE — Discharge Instructions (Signed)
COVID-19: How to Protect Yourself and Others Know how it spreads  There is currently no vaccine to prevent coronavirus disease 2019 (COVID-19).  The best way to prevent illness is to avoid being exposed to this virus.  The virus is thought to spread mainly from person-to-person. ? Between people who are in close contact with one another (within about 6 feet). ? Through respiratory droplets produced when an infected person coughs, sneezes or talks. ? These droplets can land in the mouths or noses of people who are nearby or possibly be inhaled into the lungs. ? COVID-19 may be spread by people who are not showing symptoms. Everyone should Clean your hands often  Wash your hands often with soap and water for at least 20 seconds especially after you have been in a public place, or after blowing your nose, coughing, or sneezing.  If soap and water are not readily available, use a hand sanitizer that contains at least 60% alcohol. Cover all surfaces of your hands and rub them together until they feel dry.  Avoid touching your eyes, nose, and mouth with unwashed hands. Avoid close contact  Limit contact with others as much as possible.  Avoid close contact with people who are sick.  Put distance between yourself and other people. ? Remember that some people without symptoms may be able to spread virus. ? This is especially important for people who are at higher risk of getting very GainPain.com.cy Cover your mouth and nose with a mask when around others  You could spread COVID-19 to others even if you do not feel sick.  Everyone should wear a mask in public settings and when around people not living in their household, especially when social distancing is difficult to maintain. ? Masks should not be placed on young children under age 70, anyone who has trouble breathing, or is unconscious, incapacitated or otherwise  unable to remove the mask without assistance.  The mask is meant to protect other people in case you are infected.  Do NOT use a facemask meant for a Dietitian.  Continue to keep about 6 feet between yourself and others. The mask is not a substitute for social distancing. Cover coughs and sneezes  Always cover your mouth and nose with a tissue when you cough or sneeze or use the inside of your elbow.  Throw used tissues in the trash.  Immediately wash your hands with soap and water for at least 20 seconds. If soap and water are not readily available, clean your hands with a hand sanitizer that contains at least 60% alcohol. Clean and disinfect  Clean AND disinfect frequently touched surfaces daily. This includes tables, doorknobs, light switches, countertops, handles, desks, phones, keyboards, toilets, faucets, and sinks. [email or web address]  If surfaces are dirty, clean them: Use detergent or soap and water prior to disinfection.  Then, use a household disinfectant. You can see a list of EPA-registered household disinfectants here. [email or web address] 01/26/2019 This information is not intended to replace advice given to you by your health care provider. Make sure you discuss any questions you have with your health care provider. Document Revised: 02/03/2019 Document Reviewed: 12/02/2018 Elsevier Patient Education  2020 [employer].   COVID-19 COVID-19 is a respiratory infection that is caused by a virus called severe acute respiratory syndrome coronavirus 2 (SARS-CoV-2). The disease is also known as coronavirus disease or novel coronavirus. In some people, the virus may not cause any symptoms. In others, it may cause a serious infection. The infection  can get worse quickly and can lead to complications, such as:  Pneumonia, or infection of the lungs.  Acute respiratory distress syndrome or ARDS. This is a  condition in which fluid build-up in the lungs prevents the lungs from filling with air and passing oxygen into the blood.  Acute respiratory failure. This is a condition in which there is not enough oxygen passing from the lungs to the body or when carbon dioxide is not passing from the lungs out of the body.  Sepsis or septic shock. This is a serious bodily reaction to an infection.  Blood clotting problems.  Secondary infections due to bacteria or fungus.  Organ failure. This is when your body's organs stop working. The virus that causes COVID-19 is contagious. This means that it can spread from person to person through droplets from coughs and sneezes (respiratory secretions). What are the causes? This illness is caused by a virus. You may catch the virus by:  Breathing in droplets from an infected person. Droplets can be spread by a person breathing, speaking, singing, coughing, or sneezing.  Touching something, like a table or a doorknob, that was exposed to the virus (contaminated) and then touching your mouth, nose, or eyes. What increases the risk? Risk for infection You are more likely to be infected with this virus if you:  Are within 6 feet (2 meters) of a person with COVID-19.  Provide care for or live with a person who is infected with COVID-19.  Spend time in crowded indoor spaces or live in shared housing. Risk for serious illness You are more likely to become seriously ill from the virus if you:  Are 57 years of age or older. The higher your age, the more you are at risk for serious illness.  Live in a nursing home or long-term care facility.  Have cancer.  Have a long-term (chronic) disease such as: ? Chronic lung disease, including chronic obstructive pulmonary disease or asthma. ? A long-term disease that lowers your body's ability to fight infection (immunocompromised). ? Heart disease, including heart failure, a condition in which the arteries that lead to  the heart become narrow or blocked (coronary artery disease), a disease which makes the heart muscle thick, weak, or stiff (cardiomyopathy). ? Diabetes. ? Chronic kidney disease. ? Sickle cell disease, a condition in which red blood cells have an abnormal "sickle" shape. ? Liver disease.  Are obese. What are the signs or symptoms? Symptoms of this condition can range from mild to severe. Symptoms may appear any time from 2 to 14 days after being exposed to the virus. They include:  A fever or chills.  A cough.  Difficulty breathing.  Headaches, body aches, or muscle aches.  Runny or stuffy (congested) nose.  A sore throat.  New loss of taste or smell. Some people may also have stomach problems, such as nausea, vomiting, or diarrhea. Other people may not have any symptoms of COVID-19. How is this diagnosed? This condition may be diagnosed based on:  Your signs and symptoms, especially if: ? You live in an area with a COVID-19 outbreak. ? You recently traveled to or from an area where the virus is common. ? You provide care for or live with a person who was diagnosed with COVID-19. ? You were exposed to a person who was diagnosed with COVID-19.  A physical exam.  Lab tests, which may include: ? Taking a sample of fluid from the back of your nose and throat (nasopharyngeal  fluid), your nose, or your throat using a swab. ? A sample of mucus from your lungs (sputum). ? Blood tests.  Imaging tests, which may include, X-rays, CT scan, or ultrasound. How is this treated? At present, there is no medicine to treat COVID-19. Medicines that treat other diseases are being used on a trial basis to see if they are effective against COVID-19. Your health care provider will talk with you about ways to treat your symptoms. For most people, the infection is mild and can be managed at home with rest, fluids, and over-the-counter medicines. Treatment for a serious infection usually takes  places in a hospital intensive care unit (ICU). It may include one or more of the following treatments. These treatments are given until your symptoms improve.  Receiving fluids and medicines through an IV.  Supplemental oxygen. Extra oxygen is given through a tube in the nose, a face mask, or a hood.  Positioning you to lie on your stomach (prone position). This makes it easier for oxygen to get into the lungs.  Continuous positive airway pressure (CPAP) or bi-level positive airway pressure (BPAP) machine. This treatment uses mild air pressure to keep the airways open. A tube that is connected to a motor delivers oxygen to the body.  Ventilator. This treatment moves air into and out of the lungs by using a tube that is placed in your windpipe.  Tracheostomy. This is a procedure to create a hole in the neck so that a breathing tube can be inserted.  Extracorporeal membrane oxygenation (ECMO). This procedure gives the lungs a chance to recover by taking over the functions of the heart and lungs. It supplies oxygen to the body and removes carbon dioxide. Follow these instructions at home: Lifestyle  If you are sick, stay home except to get medical care. Your health care provider will tell you how long to stay home. Call your health care provider before you go for medical care.  Rest at home as told by your health care provider.  Do not use any products that contain nicotine or tobacco, such as cigarettes, e-cigarettes, and chewing tobacco. If you need help quitting, ask your health care provider.  Return to your normal activities as told by your health care provider. Ask your health care provider what activities are safe for you. General instructions  Take over-the-counter and prescription medicines only as told by your health care provider.  Drink enough fluid to keep your urine pale yellow.  Keep all follow-up visits as told by your health care provider. This is important. How is this  prevented?  There is no vaccine to help prevent COVID-19 infection. However, there are steps you can take to protect yourself and others from this virus. To protect yourself:   Do not travel to areas where COVID-19 is a risk. The areas where COVID-19 is reported change often. To identify high-risk areas and travel restrictions, check the CDC travel website: FatFares.com.br  If you live in, or must travel to, an area where COVID-19 is a risk, take precautions to avoid infection. ? Stay away from people who are sick. ? Wash your hands often with soap and water for 20 seconds. If soap and water are not available, use an alcohol-based hand sanitizer. ? Avoid touching your mouth, face, eyes, or nose. ? Avoid going out in public, follow guidance from your state and local health authorities. ? If you must go out in public, wear a cloth face covering or face mask. Make sure  your mask covers your nose and mouth. ? Avoid crowded indoor spaces. Stay at least 6 feet (2 meters) away from others. ? Disinfect objects and surfaces that are frequently touched every day. This may include:  Counters and tables.  Doorknobs and light switches.  Sinks and faucets.  Electronics, such as phones, remote controls, keyboards, computers, and tablets. To protect others: If you have symptoms of COVID-19, take steps to prevent the virus from spreading to others.  If you think you have a COVID-19 infection, contact your health care provider right away. Tell your health care team that you think you may have a COVID-19 infection.  Stay home. Leave your house only to seek medical care. Do not use public transport.  Do not travel while you are sick.  Wash your hands often with soap and water for 20 seconds. If soap and water are not available, use alcohol-based hand sanitizer.  Stay away from other members of your household. Let healthy household members care for children and pets, if possible. If you  have to care for children or pets, wash your hands often and wear a mask. If possible, stay in your own room, separate from others. Use a different bathroom.  Make sure that all people in your household wash their hands well and often.  Cough or sneeze into a tissue or your sleeve or elbow. Do not cough or sneeze into your hand or into the air.  Wear a cloth face covering or face mask. Make sure your mask covers your nose and mouth. Where to find more information  Centers for Disease Control and Prevention: PurpleGadgets.be  World Health Organization: https://www.castaneda.info/ Contact a health care provider if:  You live in or have traveled to an area where COVID-19 is a risk and you have symptoms of the infection.  You have had contact with someone who has COVID-19 and you have symptoms of the infection. Get help right away if:  You have trouble breathing.  You have pain or pressure in your chest.  You have confusion.  You have bluish lips and fingernails.  You have difficulty waking from sleep.  You have symptoms that get worse. These symptoms may represent a serious problem that is an emergency. Do not wait to see if the symptoms will go away. Get medical help right away. Call your local emergency services (911 in the [place] Do not drive yourself to the hospital. Let the emergency medical personnel know if you think you have COVID-19. Summary  COVID-19 is a respiratory infection that is caused by a virus. It is also known as coronavirus disease or novel coronavirus. It can cause serious infections, such as pneumonia, acute respiratory distress syndrome, acute respiratory failure, or sepsis.  The virus that causes COVID-19 is contagious. This means that it can spread from person to person through droplets from breathing, speaking, singing, coughing, or sneezing.  You are more likely to develop a serious illness if you are 76 years of age  or older, have a weak immune system, live in a nursing home, or have chronic disease.  There is no medicine to treat COVID-19. Your health care provider will talk with you about ways to treat your symptoms.  Take steps to protect yourself and others from infection. Wash your hands often and disinfect objects and surfaces that are frequently touched every day. Stay away from people who are sick and wear a mask if you are sick. This information is not intended to replace advice given to  you by your health care provider. Make sure you discuss any questions you have with your health care provider. Document Revised: 03/11/2019 Document Reviewed: 06/17/2018 Elsevier Patient Education  [place] Can Do to Manage Your COVID-19 Symptoms at Home If you have possible or confirmed COVID-19: 1. Stay home from work and school. And stay away from other public places. If you must go out, avoid using any kind of public transportation, ridesharing, or taxis. 2. Monitor your symptoms carefully. If your symptoms get worse, call your healthcare provider immediately. 3. Get rest and stay hydrated. 4. If you have a medical appointment, call the healthcare provider ahead of time and tell them that you have or may have COVID-19. 5. For medical emergencies, call 911 and notify the dispatch personnel that you have or may have COVID-19. 6. Cover your cough and sneezes with a tissue or use the inside of your elbow. 7. Wash your hands often with soap and water for at least 20 seconds or clean your hands with an alcohol-based hand sanitizer that contains at least 60% alcohol. 8. As much as possible, stay in a specific room and away from other people in your home. Also, you should use a separate bathroom, if available. If you need to be around other people in or outside of the home, wear a mask. 9. Avoid sharing personal items with other people in your household, like dishes, towels, and  bedding. 10. Clean all surfaces that are touched often, like counters, tabletops, and doorknobs. Use household cleaning sprays or wipes according to the label instructions. [email or web address] 11/24/2018 This information is not intended to replace advice given to you by your health care provider. Make sure you discuss any questions you have with your health care provider. Document Revised: 04/28/2019 Document Reviewed: 04/28/2019 Elsevier Patient Education  [place] Under Monitoring Name: [facility]  Location: [place] [id number]   Infection Prevention Recommendations for Individuals Confirmed to have, or Being Evaluated for, 2019 Novel Coronavirus (COVID-19) Infection Who Receive Care at Home  Individuals who are confirmed to have, or are being evaluated for, COVID-19 should follow the prevention steps below until a healthcare provider or local or state health department says they can return to normal activities.  Stay home except to get medical care You should restrict activities outside your home, except for getting medical care. Do not go to work, school, or public areas, and do not use public transportation or taxis.  Call ahead before visiting your doctor Before your medical appointment, call the healthcare provider and tell them that you have, or are being evaluated for, COVID-19 infection. This will help the healthcare provider's office take steps to keep other people from getting infected. Ask your healthcare provider to call the local or state health department.  Monitor your symptoms Seek prompt medical attention if your illness is worsening (e.g., difficulty breathing). Before going to your medical appointment, call the healthcare provider and tell them that you have, or are being evaluated for, COVID-19 infection. Ask your healthcare provider to call the local or state health department.  Wear a facemask You should  wear a facemask that covers your nose and mouth when you are in the same room with other people and when you visit a healthcare provider. People who live with or visit you should also wear a facemask while they are in the same room with you.  Separate yourself from other  people in your home As much as possible, you should stay in a different room from other people in your home. Also, you should use a separate bathroom, if available.  Avoid sharing household items You should not share dishes, drinking glasses, cups, eating utensils, towels, bedding, or other items with other people in your home. After using these items, you should wash them thoroughly with soap and water.  Cover your coughs and sneezes Cover your mouth and nose with a tissue when you cough or sneeze, or you can cough or sneeze into your sleeve. Throw used tissues in a lined trash can, and immediately wash your hands with soap and water for at least 20 seconds or use an alcohol-based hand rub.  Wash your [facility] your hands often and thoroughly with soap and water for at least 20 seconds. You can use an alcohol-based hand sanitizer if soap and water are not available and if your hands are not visibly dirty. Avoid touching your eyes, nose, and mouth with unwashed hands.   Prevention Steps for Caregivers and Household Members of Individuals Confirmed to have, or Being Evaluated for, COVID-19 Infection Being Cared for in the Home  If you live with, or provide care at home for, a person confirmed to have, or being evaluated for, COVID-19 infection please follow these guidelines to prevent infection:  Follow healthcare provider's instructions Make sure that you understand and can help the patient follow any healthcare provider instructions for all care.  Provide for the patient's basic needs You should help the patient with basic needs in the home and provide support for getting groceries, prescriptions, and other  personal needs.  Monitor the patient's symptoms If they are getting sicker, call his or her medical provider and tell them that the patient has, or is being evaluated for, COVID-19 infection. This will help the healthcare provider's office take steps to keep other people from getting infected. Ask the healthcare provider to call the local or state health department.  Limit the number of people who have contact with the patient  If possible, have only one caregiver for the patient.  Other household members should stay in another home or place of residence. If this is not possible, they should stay  in another room, or be separated from the patient as much as possible. Use a separate bathroom, if available.  Restrict visitors who do not have an essential need to be in the home.  Keep older adults, very young children, and other sick people away from the patient Keep older adults, very young children, and those who have compromised immune systems or chronic health conditions away from the patient. This includes people with chronic heart, lung, or kidney conditions, diabetes, and cancer.  Ensure good ventilation Make sure that shared spaces in the home have good air flow, such as from an air conditioner or an opened window, weather permitting.  Wash your hands often  Wash your hands often and thoroughly with soap and water for at least 20 seconds. You can use an alcohol based hand sanitizer if soap and water are not available and if your hands are not visibly dirty.  Avoid touching your eyes, nose, and mouth with unwashed hands.  Use disposable paper towels to dry your hands. If not available, use dedicated cloth towels and replace them when they become wet.  Wear a facemask and gloves  Wear a disposable facemask at all times in the room and gloves when you touch or have contact  with the patient's blood, body fluids, and/or secretions or excretions, such as sweat, saliva, sputum, nasal  mucus, vomit, urine, or feces.  Ensure the mask fits over your nose and mouth tightly, and do not touch it during use.  Throw out disposable facemasks and gloves after using them. Do not reuse.  Wash your hands immediately after removing your facemask and gloves.  If your personal clothing becomes contaminated, carefully remove clothing and launder. Wash your hands after handling contaminated clothing.  Place all used disposable facemasks, gloves, and other waste in a lined container before disposing them with other household waste.  Remove gloves and wash your hands immediately after handling these items.  Do not share dishes, glasses, or other household items with the patient  Avoid sharing household items. You should not share dishes, drinking glasses, cups, eating utensils, towels, bedding, or other items with a patient who is confirmed to have, or being evaluated for, COVID-19 infection.  After the person uses these items, you should wash them thoroughly with soap and water.  Wash laundry thoroughly  Immediately remove and wash clothes or bedding that have blood, body fluids, and/or secretions or excretions, such as sweat, saliva, sputum, nasal mucus, vomit, urine, or feces, on them.  Wear gloves when handling laundry from the patient.  Read and follow directions on labels of laundry or clothing items and detergent. In general, wash and dry with the warmest temperatures recommended on the label.  Clean all areas the individual has used often  Clean all touchable surfaces, such as counters, tabletops, doorknobs, bathroom fixtures, toilets, phones, keyboards, tablets, and bedside tables, every day. Also, clean any surfaces that may have blood, body fluids, and/or secretions or excretions on them.  Wear gloves when cleaning surfaces the patient has come in contact with.  Use a diluted bleach solution (e.g., dilute bleach with 1 part bleach and 10 parts water) or a household  disinfectant with a label that says EPA-registered for coronaviruses. To make a bleach solution at home, add 1 tablespoon of bleach to 1 quart (4 cups) of water. For a larger supply, add  cup of bleach to 1 gallon (16 cups) of water.  Read labels of cleaning products and follow recommendations provided on product labels. Labels contain instructions for safe and effective use of the cleaning product including precautions you should take when applying the product, such as wearing gloves or eye protection and making sure you have good ventilation during use of the product.  Remove gloves and wash hands immediately after cleaning.  Monitor yourself for signs and symptoms of illness Caregivers and household members are considered close contacts, should monitor their health, and will be asked to limit movement outside of the home to the extent possible. Follow the monitoring steps for close contacts listed on the symptom monitoring form.   ? If you have additional questions, contact your local health department or call the epidemiologist on call at [phone] (available 24/7). ? This guidance is subject to change. For the most up-to-date guidance from [facility], please refer to their website: [email or web address]

## 2020-03-13 NOTE — Discharge Summary (Signed)
Physician Discharge Summary  Susan [name] [facility] GUY:[id number] DOB: 06/11/1950 DOA: 03/07/2020  PCP: Salley Scarlet, MD  Admit date: 03/07/2020 Discharge date: 03/13/2020  Admitted From: Home Disposition: Home  Recommendations for Outpatient Follow-up:  1. Follow up with PCP in 1-2 weeks 2. Continue prednisone taper on discharge 3. Please obtain BMP in one week 4. Encourage patient receive Covid-19 vaccination 45 days from initial diagnosis 5. Will need further titration of her diabetic regimen with continued poorly controlled diabetes  Home Health: PT/OT/RN Equipment/Devices: Oxygen, 2 L per nasal cannula  Discharge Condition: Stable CODE STATUS: Full code Diet recommendation: Heart healthy/consistent carbohydrate diet  History of present illness:  Susan [name] is a 69 year old female with past medical history notable for chronic diastolic congestive heart failure, essential hypertension, type 2 diabetes mellitus, CKD stage IV, obesity, peripheral vascular disease, chronic neuropathy who presented to the ED via EMS with complaints of progressive shortness of breath, cough, generalized weakness with nausea and vomiting.  Patient reports symptom onset 5 days prior, associated with dyspnea on exertion and nonproductive cough.  Patient was seen by PCP and found to be hypoxic with SPO2 82% on room air.  Patient also reports weight gain over the past several weeks with increased swelling in her lower extremities.  Patient has been vaccine against Covid-19 which were administered in March and April 2021.  No recent sick contacts, no known Covid-19 exposures.  In the ED, Covid-19 PCR positive.  Noted to have an elevated BNP with chest x-ray findings of increased interstitial markings.  TRH consulted for admission for further evaluation and treatment of acute hypoxic restaurant failure secondary to CHF exacerbation and Covid-19 viral pneumonia.  Hospital course:  Acute hypoxic  respiratory failure secondary to acute Covid-19 viral pneumonia during the ongoing 2020/2021 Covid 19 Pandemic - POA Patient presenting with progressive shortness of breath.  Chest x-ray with bilateral increased interstitial markings consistent with edema versus viral/atypical respiratory infection.  Covid-19 PCR positive.  Patient was hypoxic at PCP office with SPO2 82% on room air. COVID PCR postive 03/07/2020.  CRP 10.7 and D-dimer 1.79 on admission.  Patient was started on treatment and completed 5-day course of remdesivir.  On IV steroids initially and transition to prednisone in which she will continue to taper outpatient.  Patient's oxygenation status improved down to 2 L per nasal cannula even with exertion and will discharge on 2 L nasal cannula.  Continue home isolation precautions for 14 days from initial diagnosis.  Recommend/encourage patient receive Covid-19 vaccination 45 days from initial diagnosis.  Continue albuterol MDI as needed.  The treatment plan and use of medications and known side effects were discussed with patient/family. Some of the medications used are based on case reports/anecdotal data.  All other medications being used in the management of COVID-19 based on limited study data.  Complete risks and long-term side effects are unknown, however in the best clinical judgment they seem to be of some benefit.  Patient wanted to proceed with treatment options provided.  Acute on chronic diastolic congestive heart failure, decompensated Hx essential hypertension Recent TTE 02/08/2020 with LVEF 55-60%, indeterminant diastolic dysfunction previously diagnosed with grade 1 diastolic dysfunction on TTE on 03/19/2018.  BNP elevated 1301 with chest x-ray findings of bilateral increased pulmonary interstitial markings consistent with edema.  Patient was treated with IV furosemide and will transition back to her home torsemide 40 mg twice daily on discharge.  Continue carvedilol 5 mg twice  daily.  Recommend repeat BMP in  1 week to ensure renal function stable.  Creatinine 1.97 at time of discharge.  Type 2 diabetes mellitus Hemoglobin A1c 11.8 on 12/21/2019, poorly controlled.  On NPH 10-15 units subcutaneously qAM and Novolin R sliding scale.  Elevated blood sugars during hospitalization likely direct result of high-dose IV steroid use. Hemoglobin A1c 11.0 03/09/2020.  Patient's OmniPod was held during hospitalization and supported with Lantus and NovoLog.  Resume OmniPod on discharge and close outpatient follow-up with PCP given persistent elevated hemoglobin A1c need for further adjustments. \ CKD stage IV Creatinine 1.97 at time of discharge.  Recommend repeat BMP in 1 week.  CAD Continue aspirin 81 mg p.o. daily, Plavix 75 mg p.o. daily  Iron deficiency anemia Continue ferrous sulfate 325 mg p.o. twice daily  Anxiety/depression: Continue Lexapro 20 g p.o. daily  Gout: Continue allopurinol 100 mg p.o. twice daily  Hypothyroidism: Continue levothyroxine 50 mcg p.o. daily  Neuropathy: Lyrica 150 mg p.o. twice daily  Obesity Body mass index is 44.09 kg/m.  Counseled on need for aggressive lifestyle changes/weight loss as this complicates all facets of care.  Weakness, deconditioning: Resume home health PT/OT on discharge.  Discharge Diagnoses:  Principal Problem:   Pneumonia due to COVID-19 virus Active Problems:   Essential hypertension   CAD (coronary artery disease)   RBBB (right bundle branch block)   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 mellitus (HCC)   Acute on chronic diastolic CHF (congestive heart failure) (HCC)   Hypoxia   Type 2 diabetes mellitus with stage 4 chronic kidney disease (HCC)   Obesity, Class III, BMI 40-49.9 (morbid obesity) (HCC)    Discharge Instructions  Discharge Instructions    Call MD for:  difficulty breathing, headache or visual disturbances   Complete by: As directed    Call MD for:  extreme fatigue   Complete  by: As directed    Call MD for:  persistant dizziness or light-headedness   Complete by: As directed    Call MD for:  persistant nausea and vomiting   Complete by: As directed    Call MD for:  severe uncontrolled pain   Complete by: As directed    Call MD for:  temperature >100.4   Complete by: As directed    Diet - low sodium heart healthy   Complete by: As directed    Increase activity slowly   Complete by: As directed      Allergies as of 03/13/2020      Reactions   Codeine Nausea And Vomiting      Medication List    STOP taking these medications   loperamide 2 MG capsule Commonly known as: IMODIUM     TAKE these medications   albuterol 108 (90 Base) MCG/ACT inhaler Commonly known as: VENTOLIN HFA TAKE 2 PUFFS BY MOUTH EVERY 6 HOURS AS NEEDED FOR WHEEZE OR SHORTNESS OF BREATH What changed: See the new instructions.   allopurinol 100 MG tablet Commonly known as: ZYLOPRIM TAKE 1 TABLET BY MOUTH TWICE A DAY   aspirin EC 81 MG tablet Take 81 mg by mouth every morning.   carvedilol 12.5 MG tablet Commonly known as: COREG Take 1 tablet (12.5 mg total) by mouth 2 (two) times daily with a meal.   cholecalciferol 1000 units tablet Commonly known as: VITAMIN D Take 1,000 Units by mouth daily with supper.   clopidogrel 75 MG tablet Commonly known as: PLAVIX TAKE 1 TABLET BY MOUTH EVERY DAY WITH BREAKFAST What changed: See the new  instructions.   colchicine 0.6 MG tablet Take 1 tablet (0.6 mg total) by mouth daily. As needed for gout flare   escitalopram 20 MG tablet Commonly known as: LEXAPRO TAKE 1 TABLET BY MOUTH EVERY DAY   ferrous sulfate 325 (65 FE) MG tablet Take 1 tablet (325 mg total) by mouth 2 (two) times daily with a meal. Reported on 08/21/2015   guaiFENesin-dextromethorphan 100-10 MG/5ML syrup Commonly known as: ROBITUSSIN DM Take 10 mLs by mouth every 4 (four) hours as needed for cough.   HYDROcodone-acetaminophen 5-325 MG tablet Commonly  known as: Norco Take 1 tablet by mouth every 8 (eight) hours as needed for moderate pain.   insulin NPH Human 100 UNIT/ML injection Commonly known as: NOVOLIN N Inject 10-15 Units into the skin See admin instructions. Inject 15 units subcutaneously in the morning if CBG >300; inject 10 units 200-300   insulin regular 100 units/mL injection Commonly known as: NOVOLIN R Inject into the skin See admin instructions. Manually add bolus to continuous dose via OmniPod 3 times daily per sliding scale (CBG 80-150 7 units, 151-200 9 units, 201-250 12 units, 251-300 14 units, 301- 400 17 units)   levothyroxine 50 MCG tablet Commonly known as: SYNTHROID Take 1 tablet (50 mcg total) by mouth daily before breakfast.   magnesium oxide 400 MG tablet Commonly known as: MAG-OX TAKE 1 TABLET BY MOUTH EVERY DAY   nitroGLYCERIN 0.4 MG SL tablet Commonly known as: NITROSTAT Place 1 tablet (0.4 mg total) under the tongue every 5 (five) minutes as needed for chest pain. What changed: reasons to take this   OmniPod Dash 5 Pack Pods Misc Inject into the skin See admin instructions. Use continuously with Novolin R - change every 72 hours.   ondansetron 8 MG disintegrating tablet Commonly known as: ZOFRAN-ODT Take 1 tablet (8 mg total) by mouth 3 (three) times daily. What changed:   when to take this  reasons to take this   pantoprazole 40 MG tablet Commonly known as: PROTONIX TAKE 1 TABLET BY MOUTH EVERY DAY   polyethylene glycol 17 g packet Commonly known as: MiraLax Take 17 g by mouth daily as needed for up to 14 days.   predniSONE 10 MG tablet Commonly known as: DELTASONE Take 3 tablets (30 mg total) by mouth daily for 2 days, THEN 2 tablets (20 mg total) daily for 2 days, THEN 1 tablet (10 mg total) daily for 2 days. Start taking on: March 14, 2020   pregabalin 150 MG capsule Commonly known as: Lyrica Take 1 capsule (150 mg total) by mouth 2 (two) times daily.   senna-docusate 8.6-50  MG tablet Commonly known as: Senna-S Take 2 tablets by mouth 2 (two) times daily for 14 days.   torsemide 20 MG tablet Commonly known as: DEMADEX Take 2 tablets (40 mg total) by mouth 2 (two) times daily. What changed: how much to take   traZODone 100 MG tablet Commonly known as: DESYREL TAKE 1 AND 1/2 TABLETS BY MOUTH AT BEDTIME AS NEEDED FOR SLEEP. What changed: See the new instructions.   ursodiol 500 MG tablet Commonly known as: ACTIGALL Take 500 mg by mouth 3 (three) times daily.   vitamin C 1000 MG tablet Take 1,000 mg by mouth daily.   vitamin E 1000 UNIT capsule Generic drug: vitamin E Take 1,000 Units by mouth daily with supper.            Durable Medical Equipment  (From admission, onward)  Start     Ordered   03/12/20 0712  For home use only DME oxygen  Once       Question Answer Comment  Length of Need 12 Months   Mode or (Route) Nasal cannula   Liters per Minute 2   Frequency Continuous (stationary and portable oxygen unit needed)   Oxygen conserving device Yes   Oxygen delivery system Gas      03/12/20 0712          Follow-up Information    Jeanice Lim, Velna Hatchet, MD. Schedule an appointment as soon as possible for a visit.   Specialty: Family Medicine Why: Please make an appointment and follow up with your primary care physician in the next 7-10 days after your discharge home from the hospital. Contact information: [address] [place] [address] [phone]        [employer] Follow up.   Why: Christoper Allegra will continue tod provide you with your home oxygen needs. Contact information: [address] [place] [address] [phone]        [facility] Follow up.   Specialty: Home Health Services Why: [facility] will be providing you with a registered nurse, physical therapist, and occupational therapist.  They will call you in the next 24-48 hours to set up times and days for  therapy.             Allergies  Allergen Reactions  . Codeine Nausea And Vomiting    Consultations:  None   Procedures/Studies: DG Chest Portable 1 View  Result Date: 03/07/2020 CLINICAL DATA:  69 year old female with shortness of breath. EXAM: PORTABLE CHEST 1 VIEW COMPARISON:  Chest radiographs 02/06/2020 and earlier. FINDINGS: Portable AP semi upright view at 1721 hours. Stable lung volumes and mediastinal contours. Symmetric increased pulmonary interstitial markings since last month. Pulmonary vasculature appears indistinct. No pneumothorax, consolidation or pleural effusion. Paucity of bowel gas in the upper abdomen. No acute osseous abnormality identified. IMPRESSION: Bilateral increased pulmonary interstitial markings since last month. Favor interstitial edema over viral/atypical respiratory infection. Electronically Signed   By: Odessa Fleming M.D.   On: 03/07/2020 17:39      Subjective: Patient seen and examined bedside, resting comfortably.  Slightly anxious about discharge today.  Home O2 ambulatory test yesterday with no desaturations on 2 L per nasal cannula.  No other questions or concerns at this time.  Denies headache, no fever/chills/night sweats, no nausea/vomiting/diarrhea, no chest pain, no palpitations, no abdominal pain.  No acute events overnight per nursing staff.  Discharge Exam: Vitals:   03/13/20 0700 03/13/20 0800  BP:  (!) 148/57  Pulse:  67  Resp:  13  Temp:  97.7 F (36.5 C)  SpO2: 92% 97%   Vitals:   03/13/20 0500 03/13/20 0601 03/13/20 0700 03/13/20 0800  BP:  (!) 149/70  (!) 148/57  Pulse:  65  67  Resp:    13  Temp:  97.8 F (36.6 C)  97.7 F (36.5 C)  TempSrc:  Oral  Oral  SpO2:  91% 92% 97%  Weight: 123.6 kg     Height:        General: Pt is alert, awake, not in acute distress Cardiovascular: RRR, S1/S2 +, no rubs, no gallops Respiratory: CTA bilaterally, no wheezing, no rhonchi, on 2 L nasal cannula with SPO2 97% Abdominal: Soft,  NT, ND, bowel sounds + Extremities: no edema, no cyanosis    The results of significant diagnostics  from this hospitalization (including imaging, microbiology, ancillary and laboratory) are listed below for reference.     Microbiology: Recent Results (from the past 240 hour(s))  Respiratory Panel by RT PCR (Flu A&B, Covid) - Nasopharyngeal Swab     Status: Abnormal   Collection Time: 03/07/20  5:01 PM   Specimen: Nasopharyngeal Swab  Result Value Ref Range Status   SARS Coronavirus 2 by RT PCR POSITIVE (A) NEGATIVE Final    Comment: RESULT CALLED TO, READ BACK BY AND VERIFIED WITH: [name] RN 03/07/20 AT 1923 SK (NOTE) SARS-CoV-2 target nucleic acids are DETECTED.  SARS-CoV-2 RNA is generally detectable in upper respiratory specimens  during the acute phase of infection. Positive results are indicative of the presence of the identified virus, but do not rule out bacterial infection or co-infection with other pathogens not detected by the test. Clinical correlation with patient history and other diagnostic information is necessary to determine patient infection status. The expected result is Negative.  Fact Sheet for Patients:  https://www.moore.com/  Fact Sheet for Healthcare Providers: https://www.young.biz/  This test is not yet approved or cleared by the [place] FDA and  has been authorized for detection and/or diagnosis of SARS-CoV-2 by FDA under an Emergency Use Authorization (EUA).  This EUA will remain in effect (meaning this test can be use d) for the duration of  the COVID-19 declaration under Section 564(b)(1) of the Act, 21 U.S.C. section 360bbb-3(b)(1), unless the authorization is terminated or revoked sooner.      Influenza A by PCR NEGATIVE NEGATIVE Final   Influenza B by PCR NEGATIVE NEGATIVE Final    Comment: (NOTE) The Xpert Xpress SARS-CoV-2/FLU/RSV assay is intended as an aid in  the diagnosis of influenza  from Nasopharyngeal swab specimens and  should not be used as a sole basis for treatment. Nasal washings and  aspirates are unacceptable for Xpert Xpress SARS-CoV-2/FLU/RSV  testing.  Fact Sheet for Patients: https://www.moore.com/  Fact Sheet for Healthcare Providers: https://www.young.biz/  This test is not yet approved or cleared by the [place] FDA and  has been authorized for detection and/or diagnosis of SARS-CoV-2 by  FDA under an Emergency Use Authorization (EUA). This EUA will remain  in effect (meaning this test can be used) for the duration of the  Covid-19 declaration under Section 564(b)(1) of the Act, 21  U.S.C. section 360bbb-3(b)(1), unless the authorization is  terminated or revoked. Performed at [facility], [address], [place], [place] [address]   Blood culture (routine x 2)     Status: None   Collection Time: 03/07/20  5:07 PM   Specimen: Site Not Specified; Blood  Result Value Ref Range Status   Specimen Description SITE NOT SPECIFIED  Final   Special Requests   Final    BOTTLES DRAWN AEROBIC AND ANAEROBIC Blood Culture results may not be optimal due to an excessive volume of blood received in culture bottles   Culture   Final    NO GROWTH 5 DAYS Performed at [facility], [address], [place], [place] [address]    Report Status 03/12/2020 FINAL  Final  Blood culture (routine x 2)     Status: None   Collection Time: 03/07/20  6:16 PM   Specimen: BLOOD RIGHT HAND  Result Value Ref Range Status   Specimen Description BLOOD RIGHT HAND  Final   Special Requests   Final    BOTTLES DRAWN AEROBIC AND ANAEROBIC Blood Culture adequate volume  Culture   Final    NO GROWTH 5 DAYS Performed at [facility], [address] [place], [place] [address]    Report Status 03/12/2020 FINAL  Final  Culture, Urine     Status: Abnormal   Collection Time: 03/08/20  6:22 PM   Specimen: Urine, Clean Catch   Result Value Ref Range Status   Specimen Description URINE, CLEAN CATCH  Final   Special Requests   Final    NONE Performed at [facility], [address], [place], [place] [address]    Culture MULTIPLE SPECIES PRESENT, SUGGEST RECOLLECTION (A)  Final   Report Status 03/09/2020 FINAL  Final     Labs: BNP (last 3 results) Recent Labs    05/28/19 1151 12/21/19 1550 03/07/20 1816  BNP 586.5* 219.2* 1,301.4*   Basic Metabolic Panel: Recent Labs  Lab 03/09/20 0055 03/10/20 0500 03/11/20 0409 03/12/20 1117 03/13/20 0351  NA 127* 128* 133* 138 136  K 4.6 4.1 4.1 4.0 4.4  CL 93* 93* 97* 103 99  CO2 23 24 27 23 28   GLUCOSE 578* 418* 210* 116* 135*  BUN 72* 84* 85* 85* 91*  CREATININE 2.27* 2.05* 2.01* 2.11* 1.97*  CALCIUM 7.9* 8.1* 8.2* 8.4* 8.3*   Liver Function Tests: Recent Labs  Lab 03/08/20 0214 03/09/20 0055 03/10/20 0500 03/11/20 0409 03/12/20 1117  AST 19 18 14* 12* 15  ALT 16 14 13 10 11   ALKPHOS 112 99 88 81 79  BILITOT 0.6 0.5 0.5 0.7 0.6  PROT 5.9* 5.2* 5.1* 4.9* 5.1*  ALBUMIN 2.6* 2.4* 2.3* 2.1* 2.2*   No results for input(s): LIPASE, AMYLASE in the last 168 hours. No results for input(s): AMMONIA in the last 168 hours. CBC: Recent Labs  Lab 03/07/20 1816 03/08/20 0214  WBC 5.4 3.8*  NEUTROABS 4.7  --   HGB 9.8* 9.7*  HCT 32.7* 33.4*  MCV 87.2 89.1  PLT 195 180   Cardiac Enzymes: No results for input(s): CKTOTAL, CKMB, CKMBINDEX, TROPONINI in the last 168 hours. BNP: Invalid input(s): POCBNP CBG: Recent Labs  Lab 03/12/20 0731 03/12/20 1152 03/12/20 1626 03/12/20 2139 03/13/20 0603  GLUCAP 162* 98 222* 267* 85   D-Dimer Recent Labs    03/12/20 1117 03/13/20 0351  DDIMER 1.84* 1.38*   Hgb A1c No results for input(s): HGBA1C in the last 72 hours. Lipid Profile No results for input(s): CHOL, HDL, LDLCALC, TRIG, CHOLHDL, LDLDIRECT in the last 72 hours. Thyroid function studies No results for input(s): TSH, T4TOTAL,  T3FREE, THYROIDAB in the last 72 hours.  Invalid input(s): FREET3 Anemia work up No results for input(s): VITAMINB12, FOLATE, FERRITIN, TIBC, IRON, RETICCTPCT in the last 72 hours. Urinalysis    Component Value Date/Time   COLORURINE YELLOW 03/08/2020 1810   APPEARANCEUR HAZY (A) 03/08/2020 1810   LABSPEC 1.011 03/08/2020 1810   PHURINE 5.0 03/08/2020 1810   GLUCOSEU >=500 (A) 03/08/2020 1810   HGBUR NEGATIVE 03/08/2020 1810   BILIRUBINUR NEGATIVE 03/08/2020 1810   KETONESUR NEGATIVE 03/08/2020 1810   PROTEINUR 100 (A) 03/08/2020 1810   UROBILINOGEN 0.2 04/01/2014 1659   NITRITE NEGATIVE 03/08/2020 1810   LEUKOCYTESUR LARGE (A) 03/08/2020 1810   Sepsis Labs Invalid input(s): PROCALCITONIN,  WBC,  LACTICIDVEN Microbiology Recent Results (from the past 240 hour(s))  Respiratory Panel by RT PCR (Flu A&B, Covid) - Nasopharyngeal Swab     Status: Abnormal   Collection Time: 03/07/20  5:01 PM   Specimen: Nasopharyngeal Swab  Result Value  Ref Range Status   SARS Coronavirus 2 by RT PCR POSITIVE (A) NEGATIVE Final    Comment: RESULT CALLED TO, READ BACK BY AND VERIFIED WITH: [name] RN 03/07/20 AT 1923 SK (NOTE) SARS-CoV-2 target nucleic acids are DETECTED.  SARS-CoV-2 RNA is generally detectable in upper respiratory specimens  during the acute phase of infection. Positive results are indicative of the presence of the identified virus, but do not rule out bacterial infection or co-infection with other pathogens not detected by the test. Clinical correlation with patient history and other diagnostic information is necessary to determine patient infection status. The expected result is Negative.  Fact Sheet for Patients:  https://www.moore.com/  Fact Sheet for Healthcare Providers: https://www.young.biz/  This test is not yet approved or cleared by the [place] FDA and  has been authorized for detection and/or diagnosis of SARS-CoV-2  by FDA under an Emergency Use Authorization (EUA).  This EUA will remain in effect (meaning this test can be use d) for the duration of  the COVID-19 declaration under Section 564(b)(1) of the Act, 21 U.S.C. section 360bbb-3(b)(1), unless the authorization is terminated or revoked sooner.      Influenza A by PCR NEGATIVE NEGATIVE Final   Influenza B by PCR NEGATIVE NEGATIVE Final    Comment: (NOTE) The Xpert Xpress SARS-CoV-2/FLU/RSV assay is intended as an aid in  the diagnosis of influenza from Nasopharyngeal swab specimens and  should not be used as a sole basis for treatment. Nasal washings and  aspirates are unacceptable for Xpert Xpress SARS-CoV-2/FLU/RSV  testing.  Fact Sheet for Patients: https://www.moore.com/  Fact Sheet for Healthcare Providers: https://www.young.biz/  This test is not yet approved or cleared by the [place] FDA and  has been authorized for detection and/or diagnosis of SARS-CoV-2 by  FDA under an Emergency Use Authorization (EUA). This EUA will remain  in effect (meaning this test can be used) for the duration of the  Covid-19 declaration under Section 564(b)(1) of the Act, 21  U.S.C. section 360bbb-3(b)(1), unless the authorization is  terminated or revoked. Performed at [facility], [address], [place], [place] [address]   Blood culture (routine x 2)     Status: None   Collection Time: 03/07/20  5:07 PM   Specimen: Site Not Specified; Blood  Result Value Ref Range Status   Specimen Description SITE NOT SPECIFIED  Final   Special Requests   Final    BOTTLES DRAWN AEROBIC AND ANAEROBIC Blood Culture results may not be optimal due to an excessive volume of blood received in culture bottles   Culture   Final    NO GROWTH 5 DAYS Performed at [facility], [address], [place], [place] [address]    Report Status 03/12/2020 FINAL  Final  Blood culture (routine x 2)     Status: None    Collection Time: 03/07/20  6:16 PM   Specimen: BLOOD RIGHT HAND  Result Value Ref Range Status   Specimen Description BLOOD RIGHT HAND  Final   Special Requests   Final    BOTTLES DRAWN AEROBIC AND ANAEROBIC Blood Culture adequate volume   Culture   Final    NO GROWTH 5 DAYS Performed at [facility], [address] [place], [place] [address]    Report Status 03/12/2020 FINAL  Final  Culture, Urine     Status: Abnormal   Collection Time: 03/08/20  6:22 PM   Specimen: Urine, Clean Catch  Result Value  Ref Range Status   Specimen Description URINE, CLEAN CATCH  Final   Special Requests   Final    NONE Performed at [facility], [address], [place], [place] [address]    Culture MULTIPLE SPECIES PRESENT, SUGGEST RECOLLECTION (A)  Final   Report Status 03/09/2020 FINAL  Final     Time coordinating discharge: Over 30 minutes  SIGNED:   Alvira Philips [place], DO  [facility] 03/13/2020, 10:14 AM

## 2020-03-17 ENCOUNTER — Other Ambulatory Visit: Payer: Self-pay | Admitting: Family Medicine

## 2020-03-17 DIAGNOSIS — I5032 Chronic diastolic (congestive) heart failure: Secondary | ICD-10-CM | POA: Diagnosis not present

## 2020-03-19 ENCOUNTER — Encounter (HOSPITAL_COMMUNITY): Payer: PPO

## 2020-03-26 DIAGNOSIS — F419 Anxiety disorder, unspecified: Secondary | ICD-10-CM | POA: Diagnosis not present

## 2020-03-26 DIAGNOSIS — E1142 Type 2 diabetes mellitus with diabetic polyneuropathy: Secondary | ICD-10-CM | POA: Diagnosis not present

## 2020-03-26 DIAGNOSIS — I252 Old myocardial infarction: Secondary | ICD-10-CM | POA: Diagnosis not present

## 2020-03-26 DIAGNOSIS — E039 Hypothyroidism, unspecified: Secondary | ICD-10-CM | POA: Diagnosis not present

## 2020-03-26 DIAGNOSIS — I872 Venous insufficiency (chronic) (peripheral): Secondary | ICD-10-CM | POA: Diagnosis not present

## 2020-03-26 DIAGNOSIS — I5032 Chronic diastolic (congestive) heart failure: Secondary | ICD-10-CM | POA: Diagnosis not present

## 2020-03-26 DIAGNOSIS — I429 Cardiomyopathy, unspecified: Secondary | ICD-10-CM | POA: Diagnosis not present

## 2020-03-26 DIAGNOSIS — E1122 Type 2 diabetes mellitus with diabetic chronic kidney disease: Secondary | ICD-10-CM | POA: Diagnosis not present

## 2020-03-26 DIAGNOSIS — E785 Hyperlipidemia, unspecified: Secondary | ICD-10-CM | POA: Diagnosis not present

## 2020-03-26 DIAGNOSIS — I2510 Atherosclerotic heart disease of native coronary artery without angina pectoris: Secondary | ICD-10-CM | POA: Diagnosis not present

## 2020-03-26 DIAGNOSIS — E1151 Type 2 diabetes mellitus with diabetic peripheral angiopathy without gangrene: Secondary | ICD-10-CM | POA: Diagnosis not present

## 2020-03-26 DIAGNOSIS — N184 Chronic kidney disease, stage 4 (severe): Secondary | ICD-10-CM | POA: Diagnosis not present

## 2020-03-26 DIAGNOSIS — I130 Hypertensive heart and chronic kidney disease with heart failure and stage 1 through stage 4 chronic kidney disease, or unspecified chronic kidney disease: Secondary | ICD-10-CM | POA: Diagnosis not present

## 2020-03-27 ENCOUNTER — Telehealth: Payer: Self-pay | Admitting: *Deleted

## 2020-03-27 NOTE — Telephone Encounter (Signed)
Agree with below. Addendum:    I believe that she has limits with moving and including, toileting, bathing, feeding, dressing and grooming. I believe the power wheelchair is needed for pt to be able to perform ADL's in her home

## 2020-03-27 NOTE — Telephone Encounter (Signed)
Received call from [place], [place] OT with [facility].  Requested VO for [facility] PT 2x weekly x4 weeks for strengthening, balance, and endurance.   VO given.

## 2020-04-04 ENCOUNTER — Other Ambulatory Visit: Payer: Self-pay

## 2020-04-04 ENCOUNTER — Ambulatory Visit (INDEPENDENT_AMBULATORY_CARE_PROVIDER_SITE_OTHER): Payer: PPO | Admitting: Family Medicine

## 2020-04-04 ENCOUNTER — Encounter: Payer: Self-pay | Admitting: Family Medicine

## 2020-04-04 ENCOUNTER — Telehealth: Payer: Self-pay | Admitting: *Deleted

## 2020-04-04 VITALS — BP 134/78 | HR 88 | Temp 98.9°F | Resp 16 | Ht 66.0 in | Wt 258.0 lb

## 2020-04-04 DIAGNOSIS — I5032 Chronic diastolic (congestive) heart failure: Secondary | ICD-10-CM

## 2020-04-04 DIAGNOSIS — E1122 Type 2 diabetes mellitus with diabetic chronic kidney disease: Secondary | ICD-10-CM | POA: Diagnosis not present

## 2020-04-04 DIAGNOSIS — E669 Obesity, unspecified: Secondary | ICD-10-CM | POA: Diagnosis not present

## 2020-04-04 DIAGNOSIS — I10 Essential (primary) hypertension: Secondary | ICD-10-CM

## 2020-04-04 DIAGNOSIS — E66813 Obesity, class 3: Secondary | ICD-10-CM

## 2020-04-04 DIAGNOSIS — E1165 Type 2 diabetes mellitus with hyperglycemia: Secondary | ICD-10-CM | POA: Diagnosis not present

## 2020-04-04 DIAGNOSIS — Z8616 Personal history of COVID-19: Secondary | ICD-10-CM | POA: Diagnosis not present

## 2020-04-04 DIAGNOSIS — N184 Chronic kidney disease, stage 4 (severe): Secondary | ICD-10-CM

## 2020-04-04 MED ORDER — HYDROCODONE-ACETAMINOPHEN 5-325 MG PO TABS: 1.0000 | ORAL_TABLET | Freq: Three times a day (TID) | ORAL | 0 refills | Status: DC | PRN

## 2020-04-04 NOTE — Patient Instructions (Addendum)
F/U  3 months 

## 2020-04-04 NOTE — Assessment & Plan Note (Signed)
Controlled no changes 

## 2020-04-04 NOTE — Telephone Encounter (Signed)
Agree with verbal orders  

## 2020-04-04 NOTE — Assessment & Plan Note (Deleted)
Acute on chronic renal failure Nephrology appt rescheduled, check BMET

## 2020-04-04 NOTE — Assessment & Plan Note (Signed)
Orders for PT written

## 2020-04-04 NOTE — Assessment & Plan Note (Signed)
Acute on chronic renal failure Nephrology appt rescheduled, check BMET

## 2020-04-04 NOTE — Progress Notes (Signed)
   Subjective:    Patient ID: Susan [name], female    DOB: 12-Jan-1951, 69 y.o.   MRN: [id number]  Patient presents for Hospital F/U   Pt here for hospital f/u    Acute on chronic respiratory failure due to COVID-19 infection- she was treated inpatient and sent home with prednisone which she completed. She is still weak and doesn't have energy, using her oxygen at bedtime and during the day if she gets short winded. She is also using her incentive spirometry . She is getting PT, she refused going to ALF or SNF despite multiple admissions  Note her son is currentlyin the hospital with COVID-19   DM- she states her sugar has been alright, states it has been 400 a few times, her A1C was 11%in the hospital  she is using sliding scale on NPH fo rmorning and before bedtime  Omnipod during the day She has appt endocrine in Jan??   Acute on chronic RF - due for recheck , had to reschedule her nephrology appt          Review Of Systems:  GEN- + fatigue, fever, weight loss,weakness, recent illness HEENT- denies eye drainage, change in vision, nasal discharge, CVS- denies chest pain, palpitations RESP- + SOB, denies cough, wheeze ABD- denies N/V, change in stools, abd pain GU- denies dysuria, hematuria, dribbling, incontinence MSK- +joint pain, muscle aches, injury Neuro- denies headache, dizziness, syncope, seizure activity       Objective:    BP 134/78   Pulse 88   Temp 98.9 F (37.2 C) (Temporal)   Resp 16   Ht 5\' 6"  (1.676 m)   Wt 258 lb (117 kg)   SpO2 94%   BMI 41.64 kg/m  GEN- NAD, alert and oriented x 3 HEENT- PERRL, EOMI, non injected sclera, pink conjunctiva, MMM  oropharynx clear Neck- Supple, no LAD  CVS- RRR , no murmur RESP- CTAB ABD-NABS,soft,NT,ND EXT- pedal edema with venous stasis changes ,Pulses- Radial palpated        Assessment & Plan:      Problem List Items Addressed This Visit      Unprioritized   Chronic diastolic CHF  (congestive heart failure) (HCC) - Primary (Chronic)    Weight down 10lbs  Since last visit Fluid rention has improved      Relevant Orders   CBC with Differential/Platelet   Comprehensive metabolic panel   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 mellitus ([place])    Acute on chronic renal failure Nephrology appt rescheduled, check BMET      Class 3 obesity    Orders for PT written      Diabetes mellitus type II, uncontrolled ([place])    Uncontrolled, will contact her endocrinologist       Essential hypertension    Controlled no changes        Other Visit Diagnoses    Personal history of COVID-19          Note: This dictation was prepared with Dragon dictation along with smaller phrase technology. Any transcriptional errors that result from this process are unintentional.

## 2020-04-04 NOTE — Assessment & Plan Note (Signed)
Weight down 10lbs  Since last visit Fluid rention has improved

## 2020-04-04 NOTE — Telephone Encounter (Signed)
Received call from Damita Dunnings [facility] PT with [place] [phone]~ telephone.   Requested VO to extend [facility] PT services 2x weekly x3 weeks, then 1x weekly x4 weeks for endurance, balance and strengthening.   VO given.

## 2020-04-04 NOTE — Assessment & Plan Note (Signed)
>>  ASSESSMENT AND PLAN FOR CHRONIC DIASTOLIC CHF (CONGESTIVE HEART FAILURE) (HCC) WRITTEN ON 04/04/2020  8:28 PM BY Powers, KAWANTA F  Weight down 10lbs  Since last visit Fluid rention has improved

## 2020-04-04 NOTE — Assessment & Plan Note (Signed)
Uncontrolled, will contact her endocrinologist

## 2020-04-05 ENCOUNTER — Telehealth: Payer: Self-pay | Admitting: *Deleted

## 2020-04-05 LAB — CBC WITH DIFFERENTIAL/PLATELET
Absolute Monocytes: 400 cells/uL (ref 200–950)
Basophils Absolute: 30 cells/uL (ref 0–200)
Basophils Relative: 0.6 %
Eosinophils Absolute: 695 cells/uL — ABNORMAL HIGH (ref 15–500)
Eosinophils Relative: 13.9 %
HCT: 34.2 % — ABNORMAL LOW (ref 35.0–45.0)
Hemoglobin: 10.8 g/dL — ABNORMAL LOW (ref 11.7–15.5)
Lymphs Abs: 735 cells/uL — ABNORMAL LOW (ref 850–3900)
MCH: 27.2 pg (ref 27.0–33.0)
MCHC: 31.6 g/dL — ABNORMAL LOW (ref 32.0–36.0)
MCV: 86.1 fL (ref 80.0–100.0)
MPV: 11.8 fL (ref 7.5–12.5)
Monocytes Relative: 8 %
Neutro Abs: 3140 cells/uL (ref 1500–7800)
Neutrophils Relative %: 62.8 %
Platelets: 252 10*3/uL (ref 140–400)
RBC: 3.97 10*6/uL (ref 3.80–5.10)
RDW: 15.7 % — ABNORMAL HIGH (ref 11.0–15.0)
Total Lymphocyte: 14.7 %
WBC: 5 10*3/uL (ref 3.8–10.8)

## 2020-04-05 LAB — COMPREHENSIVE METABOLIC PANEL
AG Ratio: 1.3 (calc) (ref 1.0–2.5)
ALT: 13 U/L (ref 6–29)
AST: 15 U/L (ref 10–35)
Albumin: 3.3 g/dL — ABNORMAL LOW (ref 3.6–5.1)
Alkaline phosphatase (APISO): 129 U/L (ref 37–153)
BUN/Creatinine Ratio: 28 (calc) — ABNORMAL HIGH (ref 6–22)
BUN: 62 mg/dL — ABNORMAL HIGH (ref 7–25)
CO2: 27 mmol/L (ref 20–32)
Calcium: 9.3 mg/dL (ref 8.6–10.4)
Chloride: 104 mmol/L (ref 98–110)
Creat: 2.21 mg/dL — ABNORMAL HIGH (ref 0.60–0.93)
Globulin: 2.6 g/dL (calc) (ref 1.9–3.7)
Glucose, Bld: 202 mg/dL — ABNORMAL HIGH (ref 65–99)
Potassium: 4.9 mmol/L (ref 3.5–5.3)
Sodium: 139 mmol/L (ref 135–146)
Total Bilirubin: 0.4 mg/dL (ref 0.2–1.2)
Total Protein: 5.9 g/dL — ABNORMAL LOW (ref 6.1–8.1)

## 2020-04-05 NOTE — Telephone Encounter (Signed)
-----   Message from [place], LPN sent at 51/70/0174 11:20 AM EST ----- Regarding: RE: Pt needs hospitll f/u with Dr. [name] endocrinology Call placed to Dr. Almetta Lovely office.   No appointments are available until 05/14/2020. Patient has appointment to F/U in January 2022.  Requested message be sent to Dr [name] to review.  ----- Message ----- From: Alycia Rossetti, MD Sent: 04/04/2020   8:32 PM EST To: Eden Lathe Barrett Goldie, LPN Subject: Pt needs hospitll f/u with Dr. [name] endocri#    She has omnipod, and NPH  Sugars fluctuating She doesn't know how to adjust omnipod

## 2020-04-05 NOTE — Telephone Encounter (Signed)
Received return call from [facility] with Dr. Almetta Lovely office.   Advised that patient can call Nancy Nordmann Coordinator [phone] to discuss insulin dose titration. If needed at that time, earlier appointment can be scheduled.   Call placed to patient and patient made aware. States that she will contact [employer].

## 2020-04-06 ENCOUNTER — Telehealth: Payer: Self-pay | Admitting: Family Medicine

## 2020-04-06 NOTE — Telephone Encounter (Signed)
Noted  

## 2020-04-06 NOTE — Progress Notes (Signed)
  Chronic Care Management   Note  04/06/2020 Name: Susan [name] [facility] MRN: [id number] DOB: [birth year]  Susan [name] is a 69 y.o. year old female who is a primary care patient of Susan [name], Susan Nunnery, Susan [name]. I reached out to Ailene Ards by phone today in response to a referral sent by Susan [name]'s PCP, Susan [name], Susan Nunnery, Susan [name].   Susan [name] was given information about Chronic Care Management services today including:  1. CCM service includes personalized support from designated clinical staff supervised by her physician, including individualized plan of care and coordination with other care providers 2. 24/7 contact phone numbers for assistance for urgent and routine care needs. 3. Service will only be billed when office clinical staff spend 20 minutes or more in a month to coordinate care. 4. Only one practitioner may furnish and bill the service in a calendar month. 5. The patient may stop CCM services at any time (effective at the end of the month) by phone call to the office staff.   Patient wishes to consider information provided and/or speak with a member of the care team before deciding about enrollment in care management services.   Follow up plan:   Susan [name] [employer] Scheduler

## 2020-04-12 ENCOUNTER — Ambulatory Visit: Payer: PPO | Admitting: Vascular Surgery

## 2020-04-12 ENCOUNTER — Other Ambulatory Visit: Payer: Self-pay

## 2020-04-12 ENCOUNTER — Encounter: Payer: Self-pay | Admitting: Vascular Surgery

## 2020-04-12 ENCOUNTER — Ambulatory Visit (HOSPITAL_COMMUNITY)
Admission: RE | Admit: 2020-04-12 | Discharge: 2020-04-12 | Disposition: A | Discharge status: Home / Self Care | Payer: PPO | Source: Ambulatory Visit | Attending: Vascular Surgery | Admitting: Vascular Surgery

## 2020-04-12 VITALS — BP 155/84 | HR 83 | Temp 97.7°F | Resp 20 | Ht 66.0 in | Wt 265.0 lb

## 2020-04-12 DIAGNOSIS — I87303 Chronic venous hypertension (idiopathic) without complications of bilateral lower extremity: Secondary | ICD-10-CM

## 2020-04-12 DIAGNOSIS — M25569 Pain in unspecified knee: Secondary | ICD-10-CM | POA: Diagnosis not present

## 2020-04-12 NOTE — Progress Notes (Signed)
Referring Physician: Dr. Dellia Nims  Patient name: Susan [name] MRN: [id number] DOB: 12-24-1950 Sex: female  REASON FOR CONSULT: Thigh wound possible iliac vein obstruction  HPI: Susan [name] is a 69 y.o. female, who has had multiple thigh wounds on the left side recently all of these healed spontaneously.  Patient has had stasis ulcers on her lower extremities in the past and these did heal with compression.  She tries to wear her compression stockings as often as possible.  Of note she has had a right transmetatarsal amputation by Dr. Sharol Given in the past for infection.  She was able to heal this spontaneously.  She has a history of diabetes and obesity.  She also has renal dysfunction CKD4.  She has also had some episodes of cardiac dysfunction in the past with episodes of congestive heart failure.  She has tried to lose weight in the past but was unsuccessful.  Unfortunately her husband and son insisted that she fix "country meals" every day 3 times a day and she usually eats these as well.  Due to her mobility issues she is not really able to exercise.  Other medical problems include renal dysfunction with baseline creatinine of 2.  She had an arterial noninvasive study in 2019 which showed normal toe pressure bilaterally.  She currently has no active wounds.  Past Medical History:  Diagnosis Date  . Acute MI ([place]) 1999; 2007  . Anemia    hx  . Anginal pain ([place])   . Anxiety   . ARF (acute renal failure) ([place]) 06/2017   [facility]  . Arthritis    "generalized" (03/15/2014)  . CAD (coronary artery disease)    MI in 2000 - MI  2007 - treated bare metal stent (no nuclear since then as 9/11)  . Carotid artery disease ([place])   . CHF (congestive heart failure) ([place])   . Chronic diastolic heart failure (HCC)    a) ECHO (08/2013) EF 55-60% and RV function nl b) RHC (08/2013) RA 4, RV 30/5/7, PA 25/10 (16), PCWP 7, Fick CO/CI 6.3/2.7, PVR 1.5 WU, PA 61 and 66%  . Daily headache     "~ every other day; since I fell in June" (03/15/2014)  . Depression   . Dyslipidemia   . Dyspnea   . Exertional shortness of breath   . HTN (hypertension)   . Hypothyroidism   . Neuropathy   . Obesity   . Osteoarthritis   . Peripheral neuropathy   . PONV (postoperative nausea and vomiting)   . RBBB (right bundle branch block)    Old  . Stroke [facility])    mini strokes  . Syncope    likely due to low blood sugar  . Tachycardia    Sinus tachycardia  . Type II diabetes mellitus (HCC)    Type II  . Urinary incontinence   . Venous insufficiency    Past Surgical History:  Procedure Laterality Date  . ABDOMINAL HYSTERECTOMY  1980's  . AMPUTATION Right 02/24/2018   Procedure: RIGHT FOOT GREAT TOE AND 2ND TOE AMPUTATION;  Surgeon: Newt Minion, MD;  Location: [place];  Service: Orthopedics;  Laterality: Right;  . AMPUTATION Right 04/30/2018   Procedure: RIGHT TRANSMETATARSAL AMPUTATION;  Surgeon: Newt Minion, MD;  Location: [place];  Service: Orthopedics;  Laterality: Right;  . CATARACT EXTRACTION, BILATERAL Bilateral ?2013  . COLONOSCOPY W/ POLYPECTOMY    . COLONOSCOPY WITH PROPOFOL N/A 03/13/2019   Procedure: COLONOSCOPY WITH PROPOFOL;  Surgeon: Jerene Bears, MD;  Location: [facility] ENDOSCOPY;  Service: Gastroenterology;  Laterality: N/A;  . [place]; 2007   "1 + 1"  . ESOPHAGOGASTRODUODENOSCOPY (EGD) WITH PROPOFOL N/A 03/13/2019   Procedure: ESOPHAGOGASTRODUODENOSCOPY (EGD) WITH PROPOFOL;  Surgeon: Jerene Bears, MD;  Location: [facility] ENDOSCOPY;  Service: Gastroenterology;  Laterality: N/A;  . EYE SURGERY Bilateral    lazer  . HEMOSTASIS CLIP PLACEMENT  03/13/2019   Procedure: HEMOSTASIS CLIP PLACEMENT;  Surgeon: Jerene Bears, MD;  Location: [facility] ENDOSCOPY;  Service: Gastroenterology;;  . KNEE ARTHROSCOPY Left 10/25/2006  . POLYPECTOMY  03/13/2019   Procedure: POLYPECTOMY;  Surgeon: Jerene Bears, MD;  Location: [place];  Service:  Gastroenterology;;  . RIGHT HEART CATH N/A 07/24/2017   Procedure: RIGHT HEART CATH;  Surgeon: Jolaine Artist, MD;  Location: [place] CV LAB;  Service: Cardiovascular;  Laterality: N/A;  . RIGHT HEART CATHETERIZATION N/A 09/22/2013   Procedure: RIGHT HEART CATH;  Surgeon: Jolaine Artist, MD;  Location: [facility] CATH LAB;  Service: Cardiovascular;  Laterality: N/A;  . SHOULDER ARTHROSCOPY WITH OPEN ROTATOR CUFF REPAIR Right 03/14/2014   Procedure: RIGHT SHOULDER ARTHROSCOPY WITH BICEPS RELEASE, OPEN SUBSCAPULA REPAIR, OPEN SUPRASPINATUS REPAIR.;  Surgeon: Meredith Pel, MD;  Location: [place];  Service: Orthopedics;  Laterality: Right;  . TOE AMPUTATION Right 02/24/2018   GREAT TOE AND 2ND TOE AMPUTATION  . TUBAL LIGATION  1970's    Family History  Problem Relation Age of Onset  . Heart attack Mother 72    SOCIAL HISTORY: Social History   Socioeconomic History  . Marital status: Married    Spouse name: Not on file  . Number of children: 3  . Years of education: 12th  . Highest education level: Not on file  Occupational History    Employer: UNEMPLOYED  Tobacco Use  . Smoking status: Former Smoker    Packs/day: 3.00    Years: 32.00    Pack years: 96.00    Types: Cigarettes    Quit date: 10/24/1997    Years since quitting: 22.4  . Smokeless tobacco: Never Used  Vaping Use  . Vaping Use: Never used  Substance and Sexual Activity  . Alcohol use: Not Currently    Comment: "might have 2-3 daiquiris in the summer"  . Drug use: No  . Sexual activity: Never  Other Topics Concern  . Not on file  Social History Narrative   Pt lives at home with her spouse.   Caffeine Use- 3 sodas daily   Social Determinants of Health   Financial Resource Strain:   . Difficulty of Paying Living Expenses: Not on file  Food Insecurity:   . Worried About Charity fundraiser in the Last Year: Not on file  . Ran Out of Food in the Last Year: Not on file  Transportation Needs: No  Transportation Needs  . Lack of Transportation (Medical): No  . Lack of Transportation (Non-Medical): No  Physical Activity: Inactive  . Days of Exercise per Week: 0 days  . Minutes of Exercise per Session: 10 min  Stress:   . Feeling of Stress : Not on file  Social Connections:   . Frequency of Communication with Friends and Family: Not on file  . Frequency of Social Gatherings with Friends and Family: Not on file  . Attends Religious Services: Not on file  . Active Member of Clubs or Organizations: Not on file  . Attends Archivist Meetings:  Not on file  . Marital Status: Not on file  Intimate Partner Violence:   . Fear of Current or Ex-Partner: Not on file  . Emotionally Abused: Not on file  . Physically Abused: Not on file  . Sexually Abused: Not on file    Allergies  Allergen Reactions  . Codeine Nausea And Vomiting    Current Outpatient Medications  Medication Sig Dispense Refill  . albuterol (VENTOLIN HFA) 108 (90 Base) MCG/ACT inhaler TAKE 2 PUFFS BY MOUTH EVERY 6 HOURS AS NEEDED FOR WHEEZE OR SHORTNESS OF BREATH (Patient taking differently: Inhale 2 puffs into the lungs every 6 (six) hours as needed for wheezing or shortness of breath. ) 8.5 g 1  . allopurinol (ZYLOPRIM) 100 MG tablet TAKE 1 TABLET BY MOUTH TWICE A DAY (Patient taking differently: Take 100 mg by mouth 2 (two) times daily. ) 180 tablet 4  . Ascorbic Acid (VITAMIN C) 1000 MG tablet Take 1,000 mg by mouth daily.    Marland Kitchen aspirin EC 81 MG tablet Take 81 mg by mouth every morning.     . carvedilol (COREG) 12.5 MG tablet Take 1 tablet (12.5 mg total) by mouth 2 (two) times daily with a meal. 180 tablet 1  . cholecalciferol (VITAMIN D) 1000 units tablet Take 1,000 Units by mouth daily with supper.     . clopidogrel (PLAVIX) 75 MG tablet TAKE 1 TABLET BY MOUTH EVERY DAY WITH BREAKFAST (Patient taking differently: Take 75 mg by mouth daily. ) 90 tablet 2  . escitalopram (LEXAPRO) 20 MG tablet TAKE 1 TABLET  BY MOUTH EVERY DAY 90 tablet 2  . ferrous sulfate 325 (65 FE) MG tablet Take 1 tablet (325 mg total) by mouth 2 (two) times daily with a meal. Reported on 08/21/2015 60 tablet 2  . guaiFENesin-dextromethorphan (ROBITUSSIN DM) 100-10 MG/5ML syrup Take 10 mLs by mouth every 4 (four) hours as needed for cough. 118 mL 0  . HYDROcodone-acetaminophen (NORCO) 5-325 MG tablet Take 1 tablet by mouth every 8 (eight) hours as needed for moderate pain. 20 tablet 0  . Insulin Disposable Pump (OMNIPOD DASH 5 PACK PODS) MISC Inject into the skin See admin instructions. Use continuously with Novolin R - change every 72 hours.    . insulin NPH Human (HUMULIN N,NOVOLIN N) 100 UNIT/ML injection Inject 10-15 Units into the skin See admin instructions. Inject 15 units subcutaneously in the morning if CBG >300; inject 10 units 200-300    . insulin regular (NOVOLIN R) 100 units/mL injection Inject into the skin See admin instructions. Manually add bolus to continuous dose via OmniPod 3 times daily per sliding scale (CBG 80-150 7 units, 151-200 9 units, 201-250 12 units, 251-300 14 units, 301- 400 17 units)    . levothyroxine (SYNTHROID, LEVOTHROID) 50 MCG tablet Take 1 tablet (50 mcg total) by mouth daily before breakfast. 90 tablet 1  . magnesium oxide (MAG-OX) 400 MG tablet TAKE 1 TABLET BY MOUTH EVERY DAY (Patient taking differently: Take 400 mg by mouth daily. ) 90 tablet 3  . nitroGLYCERIN (NITROSTAT) 0.4 MG SL tablet Place 1 tablet (0.4 mg total) under the tongue every 5 (five) minutes as needed for chest pain. (Patient taking differently: Place 0.4 mg under the tongue every 5 (five) minutes as needed for chest pain (max 3 doses). ) 25 tablet 1  . ondansetron (ZOFRAN-ODT) 8 MG disintegrating tablet Take 1 tablet (8 mg total) by mouth 3 (three) times daily. (Patient taking differently: Take 8 mg by mouth 3 (  three) times daily as needed for nausea or vomiting. ) 20 tablet 0  . pantoprazole (PROTONIX) 40 MG tablet TAKE 1  TABLET BY MOUTH EVERY DAY (Patient taking differently: Take 40 mg by mouth daily. ) 90 tablet 1  . pregabalin (LYRICA) 150 MG capsule Take 1 capsule (150 mg total) by mouth 2 (two) times daily. 60 capsule 2  . torsemide (DEMADEX) 20 MG tablet Take 2 tablets (40 mg total) by mouth 2 (two) times daily. (Patient taking differently: Take 60 mg by mouth 2 (two) times daily. )    . traZODone (DESYREL) 100 MG tablet TAKE 1 AND 1/2 TABLETS BY MOUTH AT BEDTIME AS NEEDED FOR SLEEP. (Patient taking differently: Take 100 mg by mouth at bedtime. ) 135 tablet 1  . ursodiol (ACTIGALL) 500 MG tablet Take 500 mg by mouth 3 (three) times daily.    . vitamin E (VITAMIN E) 1000 UNIT capsule Take 1,000 Units by mouth daily with supper.      No current facility-administered medications for this visit.    ROS:   General:  No weight loss, Fever, chills  HEENT: No recent headaches, no nasal bleeding, no visual changes, no sore throat  Neurologic: No dizziness, blackouts, seizures. No recent symptoms of stroke or mini- stroke. No recent episodes of slurred speech, or temporary blindness.  Cardiac: No recent episodes of chest pain/pressure, no shortness of breath at rest.  + shortness of breath with exertion.  Denies history of atrial fibrillation or irregular heartbeat  Vascular: No history of rest pain in feet.  No history of claudication.  + history of non-healing ulcer, No history of DVT   Pulmonary: No home oxygen, no productive cough, no hemoptysis,  No asthma or wheezing  Musculoskeletal:  [X]  Arthritis, [X]  Low back pain,  [X]  Joint pain  Hematologic:No history of hypercoagulable state.  No history of easy bleeding.  No history of anemia  Gastrointestinal: No hematochezia or melena,  No gastroesophageal reflux, no trouble swallowing  Urinary: [X]  chronic Kidney disease, [ ]  on HD - [ ]  MWF or [ ]  TTHS, [ ]  Burning with urination, [ ]  Frequent urination, [ ]  Difficulty urinating;   Skin: No  rashes  Psychological: No history of anxiety,  No history of depression   Physical Examination  Vitals:   04/12/20 1523  BP: (!) 155/84  Pulse: 83  Resp: 20  Temp: 97.7 F (36.5 C)  SpO2: 93%  Weight: 265 lb (120.2 kg)  Height: 5\' 6"  (1.676 m)    Body mass index is 42.77 kg/m.  General:  Alert and oriented, no acute distress HEENT: Normal Neck: No JVD Cardiac: Regular Rate and Rhythm Abdomen: Soft, non-tender, non-distended, no mass, obese Skin: No rash, bilateral circumferential hemosiderin staining from the ankle to the midcalf bilaterally Extremity Pulses: Feet pink and warm absent dorsalis pedis, posterior tibial pulses bilaterally Musculoskeletal: No deformity trace pretibial bilateral edema  Neurologic: Upper and lower extremity motor 5/5 and symmetric  DATA:  Patient had a lower extremity venous duplex today for reflux.  She did have reflux within the greater saphenous vein bilaterally with about a 6 mm vein diameter.  The vein became quite small from the mid to distal thigh.  She also had deep vein reflux bilaterally.  There was no DVT.  ASSESSMENT: Patient with an element of venous reflux in the greater saphenous vein mid thigh to groin bilaterally.  She also has some deep vein reflux.  She has never had a DVT.  Previous CT scans were not suggestive of iliac vein occlusion.  Although she may have an element of may Thurner syndrome her primary problem is her obesity which is contributing to venous hypertension and skin breakdown.   PLAN: Patient will continue to wear her compression stockings to prevent skin breakdown in her lower extremities.  She will try to make some dietary changes but she was not very optimistic about this.  Unfortunately she is not able to exercise much due to her obesity.  She will follow up with [name] in the future if she develops a new recurrent ulceration.  Consideration could be given for laser ablation of her greater saphenous vein.  However  I am not sure this is really the main culprit in her current situation with wound development.  I do not believe that she has any significant contribution from a central vein obstruction.  Ruta Hinds, MD [facility] Office: [phone]

## 2020-04-16 ENCOUNTER — Telehealth: Payer: Self-pay

## 2020-04-16 NOTE — Telephone Encounter (Signed)
Patient left VM to return MD call from Friday. Called patient back and instructed to wear compression socks and to follow up with [name] as needed if she got a recurrent ulceration. Patient verbalized understanding.

## 2020-04-17 DIAGNOSIS — I5032 Chronic diastolic (congestive) heart failure: Secondary | ICD-10-CM | POA: Diagnosis not present

## 2020-04-23 DIAGNOSIS — I509 Heart failure, unspecified: Secondary | ICD-10-CM | POA: Diagnosis not present

## 2020-04-23 DIAGNOSIS — N184 Chronic kidney disease, stage 4 (severe): Secondary | ICD-10-CM | POA: Diagnosis not present

## 2020-04-23 DIAGNOSIS — E1122 Type 2 diabetes mellitus with diabetic chronic kidney disease: Secondary | ICD-10-CM | POA: Diagnosis not present

## 2020-04-23 DIAGNOSIS — I129 Hypertensive chronic kidney disease with stage 1 through stage 4 chronic kidney disease, or unspecified chronic kidney disease: Secondary | ICD-10-CM | POA: Diagnosis not present

## 2020-04-25 ENCOUNTER — Other Ambulatory Visit: Payer: Self-pay | Admitting: *Deleted

## 2020-04-25 NOTE — Patient Outreach (Addendum)
[facility]) Care Management  04/25/2020  Parisha Beaulac [facility] [birth year] [id number]  Telephone outreach. [facility] NP Consult.  Mrs. Defrain reports she is doing much better and has made significant progress since she was in the hospital 03/07/20-03/13/20 with dx of COVID-19, respiratory failure and HF.   Other medical hx includes: HTN, DM II, CKD stage IV, Obesity, PVD, neuropathy.  She acknowledges her glucose is NOT self managed. Her levels are in the 200's-400's and her last HgbA1C was 11.0. Her son cooks. He has a learning disability and has a limited repertoire that he can prepare.   She wears her O2 only at night at 2L.  She does weigh and her wt is 266# today and she reports it can vary up or down 3#. She admits to not taking her diuretics when she has to go out.  DISCUSSED: Need to make some efforts to coach her son on better choices for her diet. Elevated glucose levels are doing damage to her eyes, kidneys and lower extremities. She acknowledges all of these things.  Advised if she doesn't take her diuretic in the am she needs to take her diurectic when she gets home from her appts. She objects stating then I will pee all night. Suggested that is a better alternative that being in the hospital the next day and she agrees with this.  We talked about the need for her to have a HCPOA and a living will. Discussed her multiple chronic illnesses which are progressive and that her life span will be limited. We talked about her desires if she would be found unconscious without a pulse or not breathing and what she would want. She states she would not want anything done but is somewhat hesitant to put this in writing. Emphasized the importance of completing these documents so her wishes are followed and so that she will not have treatment that she would not want. Also to discuss with her family what her wishes are. Will send copies of HCPOA and Living will for both Mr. And Mrs.  Coca.  Discussed [facility]'s Special Needs program that she would be eligible for with all of her chronic medical conditions. She does currently have an [facility] plan. Advised that she talk with her insurance person and have them explain this plan for her consideration.  She does wear her stockings occasionally. She just had a vascular consult because she has had recurrent venous stasis wounds which do resolve with compression. It was determined that she does NOT need surgery. She had not had a DVT. She was advised to wear her stockings daily.  Encouraged her to make all efforts to care for herself. It was a pleasure talking with her again after our initial involvement several years ago.  [name]. [name], MSN, [facility] Gerontological Nurse Practitioner [facility] Care Management [phone]

## 2020-04-30 ENCOUNTER — Encounter (HOSPITAL_COMMUNITY): Payer: PPO

## 2020-05-07 ENCOUNTER — Telehealth: Payer: Self-pay | Admitting: *Deleted

## 2020-05-07 ENCOUNTER — Ambulatory Visit: Payer: PPO | Admitting: Family Medicine

## 2020-05-07 NOTE — Telephone Encounter (Signed)
Noted patient on schedule for "bad cold". Call placed to patient to inquire.   Reports that she has had SOB, cough, chest congestion and loose stools since Wed, 05/02/2020. States that she is very weak and fatigued. Noted to have increased WOB on phone. Patient noted gasping in between words while speaking.   Advised to go directly to ER. Patient hesitant to go to ER as she was concerned about wait time. Advised that due to her multiple co-morbitiies, she is very high risk and would need evaluation at ER.   Patient reluctantly agreed to go to [facility] ER. Call placed to ER to advise patient will be coming in.   MD to be made aware.

## 2020-05-07 NOTE — Telephone Encounter (Signed)
Agree pt needs to go to ER Multiple admissions recently Last had hypoxia also

## 2020-05-17 ENCOUNTER — Encounter (HOSPITAL_COMMUNITY): Payer: Self-pay | Admitting: Emergency Medicine

## 2020-05-17 ENCOUNTER — Observation Stay (HOSPITAL_COMMUNITY)
Admission: EM | Admit: 2020-05-17 | Discharge: 2020-05-18 | Disposition: A | Discharge status: Home / Self Care | Payer: PPO | Attending: Internal Medicine | Admitting: Internal Medicine

## 2020-05-17 ENCOUNTER — Emergency Department (HOSPITAL_COMMUNITY): Payer: PPO

## 2020-05-17 ENCOUNTER — Other Ambulatory Visit: Payer: Self-pay

## 2020-05-17 DIAGNOSIS — E1165 Type 2 diabetes mellitus with hyperglycemia: Secondary | ICD-10-CM | POA: Insufficient documentation

## 2020-05-17 DIAGNOSIS — Z7982 Long term (current) use of aspirin: Secondary | ICD-10-CM | POA: Insufficient documentation

## 2020-05-17 DIAGNOSIS — R52 Pain, unspecified: Secondary | ICD-10-CM | POA: Diagnosis not present

## 2020-05-17 DIAGNOSIS — E1140 Type 2 diabetes mellitus with diabetic neuropathy, unspecified: Secondary | ICD-10-CM | POA: Diagnosis not present

## 2020-05-17 DIAGNOSIS — Z79899 Other long term (current) drug therapy: Secondary | ICD-10-CM | POA: Diagnosis not present

## 2020-05-17 DIAGNOSIS — Z20822 Contact with and (suspected) exposure to covid-19: Secondary | ICD-10-CM | POA: Insufficient documentation

## 2020-05-17 DIAGNOSIS — L8990 Pressure ulcer of unspecified site, unspecified stage: Secondary | ICD-10-CM | POA: Diagnosis not present

## 2020-05-17 DIAGNOSIS — Z955 Presence of coronary angioplasty implant and graft: Secondary | ICD-10-CM | POA: Insufficient documentation

## 2020-05-17 DIAGNOSIS — Z8616 Personal history of COVID-19: Secondary | ICD-10-CM | POA: Diagnosis not present

## 2020-05-17 DIAGNOSIS — I5033 Acute on chronic diastolic (congestive) heart failure: Secondary | ICD-10-CM | POA: Diagnosis not present

## 2020-05-17 DIAGNOSIS — K529 Noninfective gastroenteritis and colitis, unspecified: Secondary | ICD-10-CM | POA: Diagnosis not present

## 2020-05-17 DIAGNOSIS — Z794 Long term (current) use of insulin: Secondary | ICD-10-CM | POA: Insufficient documentation

## 2020-05-17 DIAGNOSIS — R112 Nausea with vomiting, unspecified: Secondary | ICD-10-CM | POA: Diagnosis not present

## 2020-05-17 DIAGNOSIS — Z87891 Personal history of nicotine dependence: Secondary | ICD-10-CM | POA: Insufficient documentation

## 2020-05-17 DIAGNOSIS — R739 Hyperglycemia, unspecified: Secondary | ICD-10-CM

## 2020-05-17 DIAGNOSIS — I130 Hypertensive heart and chronic kidney disease with heart failure and stage 1 through stage 4 chronic kidney disease, or unspecified chronic kidney disease: Secondary | ICD-10-CM | POA: Insufficient documentation

## 2020-05-17 DIAGNOSIS — I2510 Atherosclerotic heart disease of native coronary artery without angina pectoris: Secondary | ICD-10-CM | POA: Diagnosis not present

## 2020-05-17 DIAGNOSIS — E039 Hypothyroidism, unspecified: Secondary | ICD-10-CM | POA: Diagnosis not present

## 2020-05-17 DIAGNOSIS — N184 Chronic kidney disease, stage 4 (severe): Secondary | ICD-10-CM | POA: Diagnosis not present

## 2020-05-17 DIAGNOSIS — I10 Essential (primary) hypertension: Secondary | ICD-10-CM | POA: Diagnosis not present

## 2020-05-17 DIAGNOSIS — R197 Diarrhea, unspecified: Secondary | ICD-10-CM | POA: Diagnosis not present

## 2020-05-17 DIAGNOSIS — R059 Cough, unspecified: Secondary | ICD-10-CM | POA: Diagnosis not present

## 2020-05-17 DIAGNOSIS — U071 COVID-19: Secondary | ICD-10-CM | POA: Diagnosis not present

## 2020-05-17 DIAGNOSIS — R109 Unspecified abdominal pain: Secondary | ICD-10-CM | POA: Diagnosis not present

## 2020-05-17 DIAGNOSIS — E1122 Type 2 diabetes mellitus with diabetic chronic kidney disease: Secondary | ICD-10-CM | POA: Insufficient documentation

## 2020-05-17 DIAGNOSIS — Z7902 Long term (current) use of antithrombotics/antiplatelets: Secondary | ICD-10-CM | POA: Insufficient documentation

## 2020-05-17 DIAGNOSIS — E860 Dehydration: Secondary | ICD-10-CM | POA: Diagnosis not present

## 2020-05-17 DIAGNOSIS — I5032 Chronic diastolic (congestive) heart failure: Secondary | ICD-10-CM | POA: Diagnosis not present

## 2020-05-17 LAB — CBC
HCT: 37.8 % (ref 36.0–46.0)
Hemoglobin: 11.4 g/dL — ABNORMAL LOW (ref 12.0–15.0)
MCH: 26 pg (ref 26.0–34.0)
MCHC: 30.2 g/dL (ref 30.0–36.0)
MCV: 86.1 fL (ref 80.0–100.0)
Platelets: 277 10*3/uL (ref 150–400)
RBC: 4.39 MIL/uL (ref 3.87–5.11)
RDW: 15.4 % (ref 11.5–15.5)
WBC: 9.6 10*3/uL (ref 4.0–10.5)
nRBC: 0 % (ref 0.0–0.2)

## 2020-05-17 LAB — COMPREHENSIVE METABOLIC PANEL
ALT: 13 U/L (ref 0–44)
AST: 17 U/L (ref 15–41)
Albumin: 3.3 g/dL — ABNORMAL LOW (ref 3.5–5.0)
Alkaline Phosphatase: 138 U/L — ABNORMAL HIGH (ref 38–126)
Anion gap: 12 (ref 5–15)
BUN: 25 mg/dL — ABNORMAL HIGH (ref 8–23)
CO2: 27 mmol/L (ref 22–32)
Calcium: 9.1 mg/dL (ref 8.9–10.3)
Chloride: 96 mmol/L — ABNORMAL LOW (ref 98–111)
Creatinine, Ser: 1.48 mg/dL — ABNORMAL HIGH (ref 0.44–1.00)
GFR, Estimated: 38 mL/min — ABNORMAL LOW (ref >60)
Glucose, Bld: 397 mg/dL — ABNORMAL HIGH (ref 70–99)
Potassium: 4.1 mmol/L (ref 3.5–5.1)
Sodium: 135 mmol/L (ref 135–145)
Total Bilirubin: 0.7 mg/dL (ref 0.3–1.2)
Total Protein: 7.4 g/dL (ref 6.5–8.1)

## 2020-05-17 LAB — CBG MONITORING, ED
Glucose-Capillary: 383 mg/dL — ABNORMAL HIGH (ref 70–99)
Glucose-Capillary: 397 mg/dL — ABNORMAL HIGH (ref 70–99)

## 2020-05-17 LAB — CBC WITH DIFFERENTIAL/PLATELET
Abs Immature Granulocytes: 0.05 10*3/uL (ref 0.00–0.07)
Basophils Absolute: 0.1 10*3/uL (ref 0.0–0.1)
Basophils Relative: 0 %
Eosinophils Absolute: 0.4 10*3/uL (ref 0.0–0.5)
Eosinophils Relative: 3 %
HCT: 42.8 % (ref 36.0–46.0)
Hemoglobin: 12.8 g/dL (ref 12.0–15.0)
Immature Granulocytes: 0 %
Lymphocytes Relative: 5 %
Lymphs Abs: 0.5 10*3/uL — ABNORMAL LOW (ref 0.7–4.0)
MCH: 26.1 pg (ref 26.0–34.0)
MCHC: 29.9 g/dL — ABNORMAL LOW (ref 30.0–36.0)
MCV: 87.3 fL (ref 80.0–100.0)
Monocytes Absolute: 0.5 10*3/uL (ref 0.1–1.0)
Monocytes Relative: 5 %
Neutro Abs: 10 10*3/uL — ABNORMAL HIGH (ref 1.7–7.7)
Neutrophils Relative %: 87 %
Platelets: 237 10*3/uL (ref 150–400)
RBC: 4.9 MIL/uL (ref 3.87–5.11)
RDW: 15.2 % (ref 11.5–15.5)
WBC: 11.5 10*3/uL — ABNORMAL HIGH (ref 4.0–10.5)
nRBC: 0 % (ref 0.0–0.2)

## 2020-05-17 LAB — RESP PANEL BY RT-PCR (FLU A&B, COVID) ARPGX2
Influenza A by PCR: NEGATIVE
Influenza B by PCR: NEGATIVE
SARS Coronavirus 2 by RT PCR: NEGATIVE

## 2020-05-17 LAB — LIPASE, BLOOD: Lipase: 18 U/L (ref 11–51)

## 2020-05-17 LAB — TROPONIN I (HIGH SENSITIVITY)
Troponin I (High Sensitivity): 27 ng/L — ABNORMAL HIGH (ref <18)
Troponin I (High Sensitivity): 23 ng/L — ABNORMAL HIGH (ref <18)

## 2020-05-17 LAB — URINALYSIS, ROUTINE W REFLEX MICROSCOPIC
Bilirubin Urine: NEGATIVE
Glucose, UA: >=500 mg/dL — AB
Hgb urine dipstick: NEGATIVE
Ketones, ur: 5 mg/dL — AB
Nitrite: NEGATIVE
Protein, ur: >=300 mg/dL — AB
Specific Gravity, Urine: 1.015 (ref 1.005–1.030)
pH: 7 (ref 5.0–8.0)

## 2020-05-17 LAB — BRAIN NATRIURETIC PEPTIDE: B Natriuretic Peptide: 590 pg/mL — ABNORMAL HIGH (ref 0.0–100.0)

## 2020-05-17 LAB — LACTIC ACID, PLASMA
Lactic Acid, Venous: 1.2 mmol/L (ref 0.5–1.9)
Lactic Acid, Venous: 0.9 mmol/L (ref 0.5–1.9)

## 2020-05-17 LAB — TSH: TSH: 3.591 u[IU]/mL (ref 0.350–4.500)

## 2020-05-17 LAB — HEMOGLOBIN A1C
Hgb A1c MFr Bld: 8.7 % — ABNORMAL HIGH (ref 4.8–5.6)
Mean Plasma Glucose: 202.99 mg/dL

## 2020-05-17 LAB — GLUCOSE, CAPILLARY
Glucose-Capillary: 458 mg/dL — ABNORMAL HIGH (ref 70–99)
Glucose-Capillary: 418 mg/dL — ABNORMAL HIGH (ref 70–99)
Glucose-Capillary: 362 mg/dL — ABNORMAL HIGH (ref 70–99)

## 2020-05-17 LAB — CREATININE, SERUM
Creatinine, Ser: 1.41 mg/dL — ABNORMAL HIGH (ref 0.44–1.00)
GFR, Estimated: 40 mL/min — ABNORMAL LOW (ref >60)

## 2020-05-17 LAB — HIV ANTIBODY (ROUTINE TESTING W REFLEX): HIV Screen 4th Generation wRfx: NONREACTIVE

## 2020-05-17 LAB — GLUCOSE, RANDOM
Glucose, Bld: 468 mg/dL — ABNORMAL HIGH (ref 70–99)
Glucose, Bld: 407 mg/dL — ABNORMAL HIGH (ref 70–99)

## 2020-05-17 MED ORDER — CARVEDILOL 12.5 MG PO TABS
12.5000 mg | ORAL_TABLET | Freq: Two times a day (BID) | ORAL | Status: DC
  Administered 2020-05-17 – 2020-05-18 (×2): 12.5 mg via ORAL
  Filled 2020-05-17 (×2): qty 1

## 2020-05-17 MED ORDER — PROCHLORPERAZINE EDISYLATE 10 MG/2ML IJ SOLN
10.0000 mg | Freq: Once | INTRAMUSCULAR | Status: AC
  Administered 2020-05-17: 10 mg via INTRAVENOUS
  Filled 2020-05-17: qty 2

## 2020-05-17 MED ORDER — ONDANSETRON HCL 4 MG/2ML IJ SOLN: 4.0000 mg | Freq: Four times a day (QID) | INTRAMUSCULAR | Status: DC | PRN

## 2020-05-17 MED ORDER — ASPIRIN EC 81 MG PO TBEC
81.0000 mg | DELAYED_RELEASE_TABLET | Freq: Every morning | ORAL | Status: DC
  Administered 2020-05-18: 81 mg via ORAL
  Filled 2020-05-17: qty 1

## 2020-05-17 MED ORDER — PREGABALIN 75 MG PO CAPS
150.0000 mg | ORAL_CAPSULE | Freq: Two times a day (BID) | ORAL | Status: DC
  Administered 2020-05-17 – 2020-05-18 (×2): 150 mg via ORAL
  Filled 2020-05-17 (×2): qty 2

## 2020-05-17 MED ORDER — INSULIN ASPART 100 UNIT/ML ~~LOC~~ SOLN: 0.0000 [IU] | Freq: Three times a day (TID) | SUBCUTANEOUS | Status: DC

## 2020-05-17 MED ORDER — INSULIN GLARGINE 100 UNIT/ML ~~LOC~~ SOLN
15.0000 [IU] | Freq: Every day | SUBCUTANEOUS | Status: DC
  Administered 2020-05-17 – 2020-05-18 (×2): 15 [IU] via SUBCUTANEOUS
  Filled 2020-05-17 (×6): qty 0.15

## 2020-05-17 MED ORDER — LORAZEPAM 2 MG/ML IJ SOLN
0.5000 mg | Freq: Once | INTRAMUSCULAR | Status: AC
  Administered 2020-05-17: 0.5 mg via INTRAVENOUS
  Filled 2020-05-17: qty 1

## 2020-05-17 MED ORDER — INSULIN ASPART 100 UNIT/ML ~~LOC~~ SOLN: 0.0000 [IU] | Freq: Every day | SUBCUTANEOUS | Status: DC

## 2020-05-17 MED ORDER — ESCITALOPRAM OXALATE 10 MG PO TABS: 20.0000 mg | ORAL_TABLET | Freq: Every day | ORAL | Status: DC

## 2020-05-17 MED ORDER — INSULIN ASPART 100 UNIT/ML ~~LOC~~ SOLN
25.0000 [IU] | Freq: Once | SUBCUTANEOUS | Status: AC
  Administered 2020-05-17: 25 [IU] via SUBCUTANEOUS

## 2020-05-17 MED ORDER — HEPARIN SODIUM (PORCINE) 5000 UNIT/ML IJ SOLN
5000.0000 [IU] | Freq: Three times a day (TID) | INTRAMUSCULAR | Status: DC
  Administered 2020-05-17 – 2020-05-18 (×3): 5000 [IU] via SUBCUTANEOUS
  Filled 2020-05-17 (×3): qty 1

## 2020-05-17 MED ORDER — ONDANSETRON HCL 4 MG PO TABS: 4.0000 mg | ORAL_TABLET | Freq: Four times a day (QID) | ORAL | Status: DC | PRN

## 2020-05-17 MED ORDER — LEVOTHYROXINE SODIUM 50 MCG PO TABS
50.0000 ug | ORAL_TABLET | Freq: Every day | ORAL | Status: DC
  Administered 2020-05-18: 50 ug via ORAL
  Filled 2020-05-17: qty 1

## 2020-05-17 MED ORDER — ACETAMINOPHEN 325 MG PO TABS: 650.0000 mg | ORAL_TABLET | Freq: Four times a day (QID) | ORAL | Status: DC | PRN

## 2020-05-17 MED ORDER — CLOPIDOGREL BISULFATE 75 MG PO TABS
75.0000 mg | ORAL_TABLET | Freq: Every day | ORAL | Status: DC
  Administered 2020-05-17 – 2020-05-18 (×2): 75 mg via ORAL
  Filled 2020-05-17 (×2): qty 1

## 2020-05-17 MED ORDER — SODIUM CHLORIDE 0.9 % IV BOLUS
500.0000 mL | Freq: Once | INTRAVENOUS | Status: AC
  Administered 2020-05-17: 500 mL via INTRAVENOUS

## 2020-05-17 MED ORDER — ACETAMINOPHEN 650 MG RE SUPP: 650.0000 mg | Freq: Four times a day (QID) | RECTAL | Status: DC | PRN

## 2020-05-17 MED ORDER — ALLOPURINOL 100 MG PO TABS
100.0000 mg | ORAL_TABLET | Freq: Two times a day (BID) | ORAL | Status: DC
  Administered 2020-05-17 – 2020-05-18 (×2): 100 mg via ORAL
  Filled 2020-05-17 (×2): qty 1

## 2020-05-17 MED ORDER — HYDRALAZINE HCL 20 MG/ML IJ SOLN: 10.0000 mg | INTRAMUSCULAR | Status: DC | PRN

## 2020-05-17 MED ORDER — IOHEXOL 300 MG/ML  SOLN
75.0000 mL | Freq: Once | INTRAMUSCULAR | Status: AC | PRN
  Administered 2020-05-17: 75 mL via INTRAVENOUS

## 2020-05-17 MED ORDER — TRAZODONE HCL 50 MG PO TABS
100.0000 mg | ORAL_TABLET | Freq: Every day | ORAL | Status: DC
  Administered 2020-05-17: 100 mg via ORAL
  Filled 2020-05-17: qty 2

## 2020-05-17 MED ORDER — FERROUS SULFATE 325 (65 FE) MG PO TABS
325.0000 mg | ORAL_TABLET | Freq: Two times a day (BID) | ORAL | Status: DC
  Administered 2020-05-17 – 2020-05-18 (×2): 325 mg via ORAL
  Filled 2020-05-17 (×2): qty 1

## 2020-05-17 MED ORDER — PANTOPRAZOLE SODIUM 40 MG IV SOLR
40.0000 mg | INTRAVENOUS | Status: DC
  Administered 2020-05-17: 40 mg via INTRAVENOUS
  Filled 2020-05-17: qty 40

## 2020-05-17 MED ORDER — INSULIN ASPART 100 UNIT/ML ~~LOC~~ SOLN
0.0000 [IU] | SUBCUTANEOUS | Status: DC
  Administered 2020-05-17: 15 [IU] via SUBCUTANEOUS
  Administered 2020-05-18: 7 [IU] via SUBCUTANEOUS
  Administered 2020-05-18: 4 [IU] via SUBCUTANEOUS
  Administered 2020-05-18: 3 [IU] via SUBCUTANEOUS

## 2020-05-17 NOTE — H&P (Addendum)
History and Physical    Susan [name] ZOX:[id number] DOB: 1951-02-28 DOA: 05/17/2020  PCP: Salley Scarlet, MD   Patient coming from: Home  Chief Complaint:   HPI: Susan [name] is a 69 y.o. female with medical history significant for chronic diastolic congestive heart failure, essential hypertension, type 2 diabetes mellitus, CKD stage IV, obesity, peripheral vascular disease, and chronic neuropathy who presented to the ED via EMS with 2-week history of generalized weakness, diarrhea, fatigue, nausea, intermittent vomiting, and fever.  She was noted to have a fever of 101 Fahrenheit yesterday along with 2 episodes of diarrhea.  Blood sugars have also been elevated despite using her insulin pump at home.  She did have Covid back in October but denies any new travel or sick contacts.  She denies any shortness of breath or chest pain.  She is also noted to have a nonproductive cough for the last 1 week and has difficulty getting up any sputum.  She denies any abdominal pain or changes to her stool consistency or color.   ED Course: Vital signs stable, but elevated blood pressure readings and blood glucose levels noted.  Creatinine 1.48 which is consistent with her prior.  WBC of 11.5.  CT of the abdomen pelvis with mild intrahepatic ductal dilation noted and chest x-ray with some bilateral interstitial opacities noted that appears stable from previous.  Covid testing noted to be negative.  Patient is in sinus rhythm on EKG.  Review of Systems: Reviewed as noted above, otherwise negative.  Past Medical History:  Diagnosis Date  . Acute MI (HCC) 1999; 2007  . Anemia    hx  . Anginal pain (HCC)   . Anxiety   . ARF (acute renal failure) (HCC) 06/2017   [facility]  . Arthritis    "generalized" (03/15/2014)  . CAD (coronary artery disease)    MI in 2000 - MI  2007 - treated bare metal stent (no nuclear since then as 9/11)  . Carotid artery disease (HCC)   . CHF (congestive heart  failure) (HCC)   . Chronic diastolic heart failure (HCC)    a) ECHO (08/2013) EF 55-60% and RV function nl b) RHC (08/2013) RA 4, RV 30/5/7, PA 25/10 (16), PCWP 7, Fick CO/CI 6.3/2.7, PVR 1.5 WU, PA 61 and 66%  . Daily headache    "~ every other day; since I fell in June" (03/15/2014)  . Depression   . Dyslipidemia   . Dyspnea   . Exertional shortness of breath   . HTN (hypertension)   . Hypothyroidism   . Neuropathy   . Obesity   . Osteoarthritis   . Peripheral neuropathy   . PONV (postoperative nausea and vomiting)   . RBBB (right bundle branch block)    Old  . Stroke [facility])    mini strokes  . Syncope    likely due to low blood sugar  . Tachycardia    Sinus tachycardia  . Type II diabetes mellitus (HCC)    Type II  . Urinary incontinence   . Venous insufficiency     Past Surgical History:  Procedure Laterality Date  . ABDOMINAL HYSTERECTOMY  1980's  . AMPUTATION Right 02/24/2018   Procedure: RIGHT FOOT GREAT TOE AND 2ND TOE AMPUTATION;  Surgeon: Nadara Mustard, MD;  Location: [facility] OR;  Service: Orthopedics;  Laterality: Right;  . AMPUTATION Right 04/30/2018   Procedure: RIGHT TRANSMETATARSAL AMPUTATION;  Surgeon: Nadara Mustard, MD;  Location: [facility] OR;  Service: Orthopedics;  Laterality: Right;  . CATARACT EXTRACTION, BILATERAL Bilateral ?2013  . COLONOSCOPY W/ POLYPECTOMY    . COLONOSCOPY WITH PROPOFOL N/A 03/13/2019   Procedure: COLONOSCOPY WITH PROPOFOL;  Surgeon: Beverley Fiedler, MD;  Location: [facility] ENDOSCOPY;  Service: Gastroenterology;  Laterality: N/A;  . CORONARY ANGIOPLASTY WITH STENT PLACEMENT  1999; 2007   "1 + 1"  . ESOPHAGOGASTRODUODENOSCOPY (EGD) WITH PROPOFOL N/A 03/13/2019   Procedure: ESOPHAGOGASTRODUODENOSCOPY (EGD) WITH PROPOFOL;  Surgeon: Beverley Fiedler, MD;  Location: [facility] ENDOSCOPY;  Service: Gastroenterology;  Laterality: N/A;  . EYE SURGERY Bilateral    lazer  . HEMOSTASIS CLIP PLACEMENT  03/13/2019   Procedure: HEMOSTASIS CLIP PLACEMENT;  Surgeon: Beverley Fiedler, MD;  Location: [facility] ENDOSCOPY;  Service: Gastroenterology;;  . KNEE ARTHROSCOPY Left 10/25/2006  . POLYPECTOMY  03/13/2019   Procedure: POLYPECTOMY;  Surgeon: Beverley Fiedler, MD;  Location: [facility] ENDOSCOPY;  Service: Gastroenterology;;  . RIGHT HEART CATH N/A 07/24/2017   Procedure: RIGHT HEART CATH;  Surgeon: Dolores Patty, MD;  Location: [facility] INVASIVE CV LAB;  Service: Cardiovascular;  Laterality: N/A;  . RIGHT HEART CATHETERIZATION N/A 09/22/2013   Procedure: RIGHT HEART CATH;  Surgeon: Dolores Patty, MD;  Location: [facility] CATH LAB;  Service: Cardiovascular;  Laterality: N/A;  . SHOULDER ARTHROSCOPY WITH OPEN ROTATOR CUFF REPAIR Right 03/14/2014   Procedure: RIGHT SHOULDER ARTHROSCOPY WITH BICEPS RELEASE, OPEN SUBSCAPULA REPAIR, OPEN SUPRASPINATUS REPAIR.;  Surgeon: Cammy Copa, MD;  Location: [facility] OR;  Service: Orthopedics;  Laterality: Right;  . TOE AMPUTATION Right 02/24/2018   GREAT TOE AND 2ND TOE AMPUTATION  . TUBAL LIGATION  1970's     reports that she quit smoking about 22 years ago. Her smoking use included cigarettes. She has a 96.00 pack-year smoking history. She has never used smokeless tobacco. She reports previous alcohol use. She reports that she does not use drugs.  Allergies  Allergen Reactions  . Codeine Nausea And Vomiting    Family History  Problem Relation Age of Onset  . Heart attack Mother 50    Prior to Admission medications   Medication Sig Start Date End Date Taking? Authorizing Provider  albuterol (VENTOLIN HFA) 108 (90 Base) MCG/ACT inhaler TAKE 2 PUFFS BY MOUTH EVERY 6 HOURS AS NEEDED FOR WHEEZE OR SHORTNESS OF BREATH Patient taking differently: Inhale 2 puffs into the lungs every 6 (six) hours as needed for wheezing or shortness of breath. 06/27/19  Yes [place], Velna Hatchet, MD  allopurinol (ZYLOPRIM) 100 MG tablet TAKE 1 TABLET BY MOUTH TWICE A DAY Patient taking differently: Take 100 mg by mouth 2 (two) times daily. 08/02/19  Yes Nadara Mustard, MD   Ascorbic Acid (VITAMIN C) 1000 MG tablet Take 1,000 mg by mouth daily.   Yes [provider]  aspirin EC 81 MG tablet Take 81 mg by mouth every morning.    Yes [provider]  carvedilol (COREG) 12.5 MG tablet Take 1 tablet (12.5 mg total) by mouth 2 (two) times daily with a meal. 02/13/20  Yes [place], Velna Hatchet, MD  cholecalciferol (VITAMIN D) 1000 units tablet Take 1,000 Units by mouth daily with supper.    Yes [provider]  clopidogrel (PLAVIX) 75 MG tablet TAKE 1 TABLET BY MOUTH EVERY DAY WITH BREAKFAST Patient taking differently: Take 75 mg by mouth daily. 05/11/19  Yes Clegg, Amy D, NP  escitalopram (LEXAPRO) 20 MG tablet TAKE 1 TABLET BY MOUTH EVERY DAY 03/19/20  Yes [place], Velna Hatchet, MD  ferrous sulfate 325 (  65 FE) MG tablet Take 1 tablet (325 mg total) by mouth 2 (two) times daily with a meal. Reported on 08/21/2015 06/07/18  Yes [place], Velna Hatchet, MD  guaiFENesin-dextromethorphan [facility] DM) 100-10 MG/5ML syrup Take 10 mLs by mouth every 4 (four) hours as needed for cough. 03/13/20  Yes [place], Eric J, DO  Insulin Disposable Pump (OMNIPOD DASH 5 PACK PODS) MISC Inject into the skin See admin instructions. Use continuously with Novolin R - change every 72 hours. 11/18/18  Yes [provider]  insulin NPH Human (HUMULIN N,NOVOLIN N) 100 UNIT/ML injection Inject 10-15 Units into the skin See admin instructions. Inject 15 units subcutaneously in the morning if CBG >300; inject 10 units 200-300   Yes [provider]  insulin regular (NOVOLIN R) 100 units/mL injection Inject into the skin See admin instructions. Manually add bolus to continuous dose via OmniPod 3 times daily per sliding scale (CBG 80-150 7 units, 151-200 9 units, 201-250 12 units, 251-300 14 units, 301- 400 17 units)   Yes [provider]  levothyroxine (SYNTHROID, LEVOTHROID) 50 MCG tablet Take 1 tablet (50 mcg total) by mouth daily before breakfast. 11/07/16  Yes [place],  Mary B, PA-C  magnesium oxide (MAG-OX) 400 MG tablet TAKE 1 TABLET BY MOUTH EVERY DAY Patient taking differently: Take 400 mg by mouth daily. 09/13/19  Yes [place], Velna Hatchet, MD  metolazone (ZAROXOLYN) 2.5 MG tablet Take 2.5 mg by mouth See admin instructions. Take 1 tablet by mouth on Monday and Fridays   Yes [provider]  ondansetron (ZOFRAN-ODT) 8 MG disintegrating tablet Take 1 tablet (8 mg total) by mouth 3 (three) times daily. Patient taking differently: Take 8 mg by mouth 3 (three) times daily as needed for nausea or vomiting. 02/27/20  Yes [place], Velna Hatchet, MD  pantoprazole (PROTONIX) 40 MG tablet TAKE 1 TABLET BY MOUTH EVERY DAY Patient taking differently: Take 40 mg by mouth daily. 01/04/20  Yes [place], Velna Hatchet, MD  pregabalin (LYRICA) 150 MG capsule Take 1 capsule (150 mg total) by mouth 2 (two) times daily. 11/12/16  Yes [place], Velna Hatchet, MD  torsemide (DEMADEX) 20 MG tablet Take 2 tablets (40 mg total) by mouth 2 (two) times daily. Patient taking differently: Take 60 mg by mouth 2 (two) times daily. 02/10/20  Yes [place], Melina Schools, MD  traZODone (DESYREL) 100 MG tablet TAKE 1 AND 1/2 TABLETS BY MOUTH AT BEDTIME AS NEEDED FOR SLEEP. Patient taking differently: Take 100 mg by mouth at bedtime. 01/26/20  Yes [place], Velna Hatchet, MD  ursodiol (ACTIGALL) 500 MG tablet Take 500 mg by mouth 3 (three) times daily. 09/09/19  Yes [provider]  vitamin E 1000 UNIT capsule Take 1,000 Units by mouth daily with supper.    Yes [provider]  HYDROcodone-acetaminophen (NORCO) 5-325 MG tablet Take 1 tablet by mouth every 8 (eight) hours as needed for moderate pain. Patient not taking: No sig reported 04/04/20   Salley Scarlet, MD  nitroGLYCERIN (NITROSTAT) 0.4 MG SL tablet Place 1 tablet (0.4 mg total) under the tongue every 5 (five) minutes as needed for chest pain. Patient taking differently: Place 0.4 mg under the tongue every 5 (five) minutes as needed for chest pain  (max 3 doses). 04/28/19   Tonye Becket D, NP    Physical Exam: Vitals:   05/17/20 1325 05/17/20 1400 05/17/20 1500 05/17/20 1530  BP: (!) 188/95 (!) 183/97 (!) 194/103 (!) 198/97  Pulse: 92 98 98 (!) 103  Resp: 15  17 17 (!) 23  Temp:      TempSrc:      SpO2: 98% 98% 98% 94%  Weight:      Height:        Constitutional: NAD, calm, comfortable Vitals:   05/17/20 1325 05/17/20 1400 05/17/20 1500 05/17/20 1530  BP: (!) 188/95 (!) 183/97 (!) 194/103 (!) 198/97  Pulse: 92 98 98 (!) 103  Resp: 15 17 17  (!) 23  Temp:      TempSrc:      SpO2: 98% 98% 98% 94%  Weight:      Height:       Eyes: lids and conjunctivae normal Neck: normal, supple Respiratory: clear to auscultation bilaterally. Normal respiratory effort. No accessory muscle use.  Currently on 2 L nasal cannula oxygen Cardiovascular: Regular rate and rhythm, no murmurs. Abdomen: no tenderness, no distention. Bowel sounds positive.  Musculoskeletal:  No edema. Skin: no rashes, lesions, ulcers.  Psychiatric: Flat affect  Labs on Admission: I have personally reviewed following labs and imaging studies  CBC: Recent Labs  Lab 05/17/20 0918  WBC 11.5*  NEUTROABS 10.0*  HGB 12.8  HCT 42.8  MCV 87.3  PLT 237   Basic Metabolic Panel: Recent Labs  Lab 05/17/20 0918  NA 135  K 4.1  CL 96*  CO2 27  GLUCOSE 397*  BUN 25*  CREATININE 1.48*  CALCIUM 9.1   GFR: Estimated Creatinine Clearance: 46.7 mL/min (A) (by C-G formula based on SCr of 1.48 mg/dL (H)). Liver Function Tests: Recent Labs  Lab 05/17/20 0918  AST 17  ALT 13  ALKPHOS 138*  BILITOT 0.7  PROT 7.4  ALBUMIN 3.3*   Recent Labs  Lab 05/17/20 0918  LIPASE 18   No results for input(s): AMMONIA in the last 168 hours. Coagulation Profile: No results for input(s): INR, PROTIME in the last 168 hours. Cardiac Enzymes: No results for input(s): CKTOTAL, CKMB, CKMBINDEX, TROPONINI in the last 168 hours. BNP (last 3 results) No results for input(s):  PROBNP in the last 8760 hours. HbA1C: No results for input(s): HGBA1C in the last 72 hours. CBG: Recent Labs  Lab 05/17/20 0806 05/17/20 1342  GLUCAP 383* 397*   Lipid Profile: No results for input(s): CHOL, HDL, LDLCALC, TRIG, CHOLHDL, LDLDIRECT in the last 72 hours. Thyroid Function Tests: No results for input(s): TSH, T4TOTAL, FREET4, T3FREE, THYROIDAB in the last 72 hours. Anemia Panel: No results for input(s): VITAMINB12, FOLATE, FERRITIN, TIBC, IRON, RETICCTPCT in the last 72 hours. Urine analysis:    Component Value Date/Time   COLORURINE YELLOW 05/17/2020 0808   APPEARANCEUR CLEAR 05/17/2020 0808   LABSPEC 1.015 05/17/2020 0808   PHURINE 7.0 05/17/2020 0808   GLUCOSEU >=500 (A) 05/17/2020 0808   HGBUR NEGATIVE 05/17/2020 0808   BILIRUBINUR NEGATIVE 05/17/2020 0808   KETONESUR 5 (A) 05/17/2020 0808   PROTEINUR >=300 (A) 05/17/2020 0808   UROBILINOGEN 0.2 04/01/2014 1659   NITRITE NEGATIVE 05/17/2020 0808   LEUKOCYTESUR SMALL (A) 05/17/2020 0808    Radiological Exams on Admission: CT ABDOMEN PELVIS W CONTRAST  Result Date: 05/17/2020 CLINICAL DATA:  Abdominal pain EXAM: CT ABDOMEN AND PELVIS WITH CONTRAST TECHNIQUE: Multidetector CT imaging of the abdomen and pelvis was performed using the standard protocol following bolus administration of intravenous contrast. CONTRAST:  75mL OMNIPAQUE IOHEXOL 300 MG/ML  SOLN COMPARISON:  05/28/2019 FINDINGS: Lower chest: Small bilateral pleural effusions with bibasilar atelectasis/scarring. Hepatobiliary: Few calcified granulomas of the liver. Cholelithiasis. Mild intrahepatic duct dilatation. Common bile duct is normal  in caliber. Pancreas: Unremarkable. Spleen: Small calcified granulomas.  Otherwise unremarkable. Adrenals/Urinary Tract: Adrenals are unremarkable. Too small to characterize low-attenuation lesion of the right kidney. Air within the bladder is presumably due to instrumentation. Stomach/Bowel: Stomach is within normal  limits. Bowel is normal in caliber. Sigmoid diverticulosis. Vascular/Lymphatic: Aortic atherosclerosis. No enlarged lymph nodes identified. Reproductive: Status post hysterectomy. No adnexal masses. Other: No ascites.  Abdominal wall is unremarkable. Musculoskeletal: Degenerative changes of the included spine. No acute osseous abnormality. IMPRESSION: Mild intrahepatic duct dilatation. Cholelithiasis. Small bilateral pleural effusions. Electronically Signed   By: Guadlupe Spanish M.D.   On: 05/17/2020 12:44   DG Chest Portable 1 View  Result Date: 05/17/2020 CLINICAL DATA:  Cough and history of prior COVID-19 positivity EXAM: PORTABLE CHEST 1 VIEW COMPARISON:  03/07/2020 FINDINGS: Cardiac shadow is within normal limits. Aortic calcifications are noted. The lungs are well aerated bilaterally. Increased bilateral airspace opacities are noted when compare with the prior exam consistent with the given clinical history of prior COVID-19 positivity. No sizable effusion is seen. No bony abnormality is noted. IMPRESSION: Increase in bilateral airspace opacities when compare with the prior exam consistent with the given clinical history. Electronically Signed   By: Alcide Clever M.D.   On: 05/17/2020 08:25    EKG: Independently reviewed. SR 98bpm  Assessment/Plan Active Problems:   Intractable nausea and vomiting    Generalized weakness with intractable nausea, vomiting, and diarrhea -Likely related to viral gastroenteritis versus some diabetic gastroparesis -Hold IV fluid for now as patient does not appear clinically dehydrated -Clear liquid diet and advance as tolerated -IV antiemetics  Uncontrolled hypertension -Elevated blood pressure readings currently noted as patient could not ingest home blood pressure meds -IV hydralazine ordered as needed -Reorder home medications  Chronic diastolic CHF -No signs of decompensation currently noted -Monitor daily weights  Type 2 diabetes with noted  hyperglycemia -Appreciate diabetes coronary recommendations and has been started on SSI as well as Lantus -Advance to carb modified diet once tolerated  Prolonged QTC -Holding home escitalopram -Monitor magnesium and potassium in a.m.  CKD stage IV -Due to type 2 diabetes -Continue close monitoring  Hypothyroidism -Continue Synthroid  GERD -IV PPI daily  PVD -Continue dual antiplatelet therapy  Chronic neuropathy -Continue Lyrica   DVT prophylaxis: Heparin Code Status: Full Family Communication: Husband at bedside Disposition Plan:Admit for evaluation of N/V/D Consults called:None Admission status: Obs, Medsurg  Severity of Illness: The appropriate patient status for this patient is OBSERVATION. Observation status is judged to be reasonable and necessary in order to provide the required intensity of service to ensure the patient's safety. The patient's presenting symptoms, physical exam findings, and initial radiographic and laboratory data in the context of their medical condition is felt to place them at decreased risk for further clinical deterioration. Furthermore, it is anticipated that the patient will be medically stable for discharge from the hospital within 2 midnights of admission.     Cherrise Occhipinti D Sherryll Burger DO [facility]  If 7PM-7AM, please contact night-coverage [email or web address]  05/17/2020, 4:30 PM

## 2020-05-17 NOTE — ED Notes (Signed)
Patient transported to CT 

## 2020-05-17 NOTE — Progress Notes (Signed)
Inpatient Diabetes Program Recommendations  AACE/ADA: New Consensus Statement on Inpatient Glycemic Control   Target Ranges:  Prepandial:   less than 140 mg/dL      Peak postprandial:   less than 180 mg/dL (1-2 hours)      Critically ill patients:  140 - 180 mg/dL   Results for CHARON, SMEDBERG (MRN [id number]) as of 05/17/2020 13:42  Ref. Range 05/17/2020 08:06 05/17/2020 13:42  Glucose-Capillary Latest Ref Range: 70 - 99 mg/dL 383 (H) 397 (H)   Review of Glycemic Control  Diabetes history: DM2 Outpatient Diabetes medications: OmniPod insulin pump with Novolin R, NPH 15 units in AM if CBGs >300 or 10 units if 200-300 mg/dl Current orders for Inpatient glycemic control: None; in Emergency Room  Inpatient Diabetes Program Recommendations:    Insulin: If patient is admitted, please consider ordering Lantus 15 Q24H, CBGs AC&HS, and Novolog 0-9 units AC&HS. If patient is ordered diet and eating please consider ordering Novolog 4 units TID with meals.  NOTE: Per chart, patient has DM2, uses an OmniPod insulin pump with Regular insulins for boluses with meals and NPH insulin once a day. Geoffry Paradise, RN, Inpatient diabetes coordinator, spoke with patient on 03/09/20 during prior admission. Per note by Dr. Wyvonnia Dusky today, patient reports she has been unable to get out of bed for past 1 week with generalized weakness and chills; has had nausea all week long with intermittent vomiting. Initial glucose 383 mg/dl this am and 397 mg/dl at 13:42 today.   Thanks, Barnie Alderman, RN, MSN, CDE Diabetes Coordinator Inpatient Diabetes Program [phone] (Team Pager from 8am to 5pm)

## 2020-05-17 NOTE — ED Notes (Signed)
Sprite provided at bedside for pt.

## 2020-05-17 NOTE — ED Notes (Signed)
Ambulated pt. At bedside. Pt. Dropped to 88 without o2. Pt. Normally wears 2 liters of O2 at home. Placed 2 Liters of O2 on pt. Pts. O2 went up to 95.

## 2020-05-17 NOTE — ED Notes (Signed)
ED TO INPATIENT HANDOFF REPORT  ED Nurse Name and Phone #:   S Name/Age/Gender Susan [name] 69 y.o. female Room/Bed: APA10/APA10  Code Status   Code Status: Prior  Home/SNF/Other Home Patient oriented to: self, place, time and situation Is this baseline? Yes   Triage Complete: Triage complete  Chief Complaint Intractable nausea and vomiting [R11.2]  Triage Note Pt c/o of nausea, diarrhea, abdominal pain and cough x 3 days.    Allergies Allergies  Allergen Reactions  . Codeine Nausea And Vomiting    Level of Care/Admitting Diagnosis ED Disposition    ED Disposition Condition Comment   Admit  Hospital Area: [facility] [[id number]]  Level of Care: Med-Surg [16]  Covid Evaluation: Asymptomatic Screening Protocol (No Symptoms)  Diagnosis: Intractable nausea and vomiting [169678]  Admitting Physician: Rodena [name] [[id number]]  Attending Physician: Heath Lark D [[id number]]       B Medical/Surgery History Past Medical History:  Diagnosis Date  . Acute MI ([place]) 1999; 2007  . Anemia    hx  . Anginal pain ([place])   . Anxiety   . ARF (acute renal failure) ([place]) 06/2017   [facility]  . Arthritis    "generalized" (03/15/2014)  . CAD (coronary artery disease)    MI in 2000 - MI  2007 - treated bare metal stent (no nuclear since then as 9/11)  . Carotid artery disease ([place])   . CHF (congestive heart failure) ([place])   . Chronic diastolic heart failure (HCC)    a) ECHO (08/2013) EF 55-60% and RV function nl b) RHC (08/2013) RA 4, RV 30/5/7, PA 25/10 (16), PCWP 7, Fick CO/CI 6.3/2.7, PVR 1.5 WU, PA 61 and 66%  . Daily headache    "~ every other day; since I fell in June" (03/15/2014)  . Depression   . Dyslipidemia   . Dyspnea   . Exertional shortness of breath   . HTN (hypertension)   . Hypothyroidism   . Neuropathy   . Obesity   . Osteoarthritis   . Peripheral neuropathy   . PONV (postoperative nausea and vomiting)   . RBBB (right bundle branch  block)    Old  . Stroke [facility])    mini strokes  . Syncope    likely due to low blood sugar  . Tachycardia    Sinus tachycardia  . Type II diabetes mellitus (HCC)    Type II  . Urinary incontinence   . Venous insufficiency    Past Surgical History:  Procedure Laterality Date  . ABDOMINAL HYSTERECTOMY  1980's  . AMPUTATION Right 02/24/2018   Procedure: RIGHT FOOT GREAT TOE AND 2ND TOE AMPUTATION;  Surgeon: Newt Minion, MD;  Location: [place];  Service: Orthopedics;  Laterality: Right;  . AMPUTATION Right 04/30/2018   Procedure: RIGHT TRANSMETATARSAL AMPUTATION;  Surgeon: Newt Minion, MD;  Location: [place];  Service: Orthopedics;  Laterality: Right;  . CATARACT EXTRACTION, BILATERAL Bilateral ?2013  . COLONOSCOPY W/ POLYPECTOMY    . COLONOSCOPY WITH PROPOFOL N/A 03/13/2019   Procedure: COLONOSCOPY WITH PROPOFOL;  Surgeon: Jerene Bears, MD;  Location: [place];  Service: Gastroenterology;  Laterality: N/A;  . [place]; 2007   "1 + 1"  . ESOPHAGOGASTRODUODENOSCOPY (EGD) WITH PROPOFOL N/A 03/13/2019   Procedure: ESOPHAGOGASTRODUODENOSCOPY (EGD) WITH PROPOFOL;  Surgeon: Jerene Bears, MD;  Location: [facility] ENDOSCOPY;  Service: Gastroenterology;  Laterality: N/A;  . EYE SURGERY Bilateral    [place]  .  HEMOSTASIS CLIP PLACEMENT  03/13/2019   Procedure: HEMOSTASIS CLIP PLACEMENT;  Surgeon: Jerene Bears, MD;  Location: [facility];  Service: Gastroenterology;;  . KNEE ARTHROSCOPY Left 10/25/2006  . POLYPECTOMY  03/13/2019   Procedure: POLYPECTOMY;  Surgeon: Jerene Bears, MD;  Location: [place];  Service: Gastroenterology;;  . RIGHT HEART CATH N/A 07/24/2017   Procedure: RIGHT HEART CATH;  Surgeon: Jolaine Artist, MD;  Location: [place] CV LAB;  Service: Cardiovascular;  Laterality: N/A;  . RIGHT HEART CATHETERIZATION N/A 09/22/2013   Procedure: RIGHT HEART CATH;  Surgeon: Jolaine Artist, MD;  Location: [facility] CATH LAB;  Service:  Cardiovascular;  Laterality: N/A;  . SHOULDER ARTHROSCOPY WITH OPEN ROTATOR CUFF REPAIR Right 03/14/2014   Procedure: RIGHT SHOULDER ARTHROSCOPY WITH BICEPS RELEASE, OPEN SUBSCAPULA REPAIR, OPEN SUPRASPINATUS REPAIR.;  Surgeon: Meredith Pel, MD;  Location: [place];  Service: Orthopedics;  Laterality: Right;  . TOE AMPUTATION Right 02/24/2018   GREAT TOE AND 2ND TOE AMPUTATION  . TUBAL LIGATION  1970's     A IV Location/Drains/Wounds Patient Lines/Drains/Airways Status    Active Line/Drains/Airways    Name Placement date Placement time Site Days   Peripheral IV 05/17/20 Left Wrist 05/17/20  -  Wrist  less than 1   External Urinary Catheter 05/17/20  1610  -  less than 1          Intake/Output Last 24 hours  Intake/Output Summary (Last 24 hours) at 05/17/2020 1549 Last data filed at 05/17/2020 1044 Gross per 24 hour  Intake 1015.65 ml  Output -  Net 1015.65 ml    Labs/Imaging Results for orders placed or performed during the hospital encounter of 05/17/20 (from the past 48 hour(s))  CBG monitoring, ED     Status: Abnormal   Collection Time: 05/17/20  8:06 AM  Result Value Ref Range   Glucose-Capillary 383 (H) 70 - 99 mg/dL    Comment: Glucose reference range applies only to samples taken after fasting for at least 8 hours.  Urinalysis, Routine w reflex microscopic Urine, Clean Catch     Status: Abnormal   Collection Time: 05/17/20  8:08 AM  Result Value Ref Range   Color, Urine YELLOW YELLOW   APPearance CLEAR CLEAR   Specific Gravity, Urine 1.015 1.005 - 1.030   pH 7.0 5.0 - 8.0   Glucose, UA >=500 (A) NEGATIVE mg/dL   Hgb urine dipstick NEGATIVE NEGATIVE   Bilirubin Urine NEGATIVE NEGATIVE   Ketones, ur 5 (A) NEGATIVE mg/dL   Protein, ur >=300 (A) NEGATIVE mg/dL   Nitrite NEGATIVE NEGATIVE   Leukocytes,Ua SMALL (A) NEGATIVE   RBC / HPF 0-5 0 - 5 RBC/hpf   WBC, UA 21-50 0 - 5 WBC/hpf   Bacteria, UA RARE (A) NONE SEEN    Comment: Performed at [facility], [address], [place], [place] [address]  Resp Panel by RT-PCR (Flu A&B, Covid) Nasopharyngeal Swab     Status: None   Collection Time: 05/17/20  8:08 AM   Specimen: Nasopharyngeal Swab; Nasopharyngeal(NP) swabs in vial transport medium  Result Value Ref Range   SARS Coronavirus 2 by RT PCR NEGATIVE NEGATIVE    Comment: (NOTE) SARS-CoV-2 target nucleic acids are NOT DETECTED.  The SARS-CoV-2 RNA is generally detectable in upper respiratory specimens during the acute phase of infection. The lowest concentration of SARS-CoV-2 viral copies this assay can detect is 138 copies/mL. A negative result does not preclude SARS-Cov-2 infection and should not be used as the  sole basis for treatment or other patient management decisions. A negative result may occur with  improper specimen collection/handling, submission of specimen other than nasopharyngeal swab, presence of viral mutation(s) within the areas targeted by this assay, and inadequate number of viral copies(<138 copies/mL). A negative result must be combined with clinical observations, patient history, and epidemiological information. The expected result is Negative.  Fact Sheet for Patients:  EntrepreneurPulse.com.au  Fact Sheet for Healthcare Providers:  IncredibleEmployment.be  This test is no t yet approved or cleared by the [place] FDA and  has been authorized for detection and/or diagnosis of SARS-CoV-2 by FDA under an Emergency Use Authorization (EUA). This EUA will remain  in effect (meaning this test can be used) for the duration of the COVID-19 declaration under Section 564(b)(1) of the Act, 21 U.S.C.section 360bbb-3(b)(1), unless the authorization is terminated  or revoked sooner.       Influenza A by PCR NEGATIVE NEGATIVE   Influenza B by PCR NEGATIVE NEGATIVE    Comment: (NOTE) The Xpert Xpress SARS-CoV-2/FLU/RSV plus assay is intended as an aid in the diagnosis of  influenza from Nasopharyngeal swab specimens and should not be used as a sole basis for treatment. Nasal washings and aspirates are unacceptable for Xpert Xpress SARS-CoV-2/FLU/RSV testing.  Fact Sheet for Patients: EntrepreneurPulse.com.au  Fact Sheet for Healthcare Providers: IncredibleEmployment.be  This test is not yet approved or cleared by the [place] FDA and has been authorized for detection and/or diagnosis of SARS-CoV-2 by FDA under an Emergency Use Authorization (EUA). This EUA will remain in effect (meaning this test can be used) for the duration of the COVID-19 declaration under Section 564(b)(1) of the Act, 21 U.S.C. section 360bbb-3(b)(1), unless the authorization is terminated or revoked.  Performed at [facility], [address], [place], [place] [address]   CBC with Differential/Platelet     Status: Abnormal   Collection Time: 05/17/20  9:18 AM  Result Value Ref Range   WBC 11.5 (H) 4.0 - 10.5 K/uL   RBC 4.90 3.87 - 5.11 MIL/uL   Hemoglobin 12.8 12.0 - 15.0 g/dL   HCT 42.8 36.0 - 46.0 %   MCV 87.3 80.0 - 100.0 fL   MCH 26.1 26.0 - 34.0 pg   MCHC 29.9 (L) 30.0 - 36.0 g/dL   RDW 15.2 11.5 - 15.5 %   Platelets 237 150 - 400 K/uL   nRBC 0.0 0.0 - 0.2 %   Neutrophils Relative % 87 %   Neutro Abs 10.0 (H) 1.7 - 7.7 K/uL   Lymphocytes Relative 5 %   Lymphs Abs 0.5 (L) 0.7 - 4.0 K/uL   Monocytes Relative 5 %   Monocytes Absolute 0.5 0.1 - 1.0 K/uL   Eosinophils Relative 3 %   Eosinophils Absolute 0.4 0.0 - 0.5 K/uL   Basophils Relative 0 %   Basophils Absolute 0.1 0.0 - 0.1 K/uL   Immature Granulocytes 0 %   Abs Immature Granulocytes 0.05 0.00 - 0.07 K/uL    Comment: Performed at [facility], [address] [place], [place] [address]  Comprehensive metabolic panel     Status: Abnormal   Collection Time: 05/17/20  9:18 AM  Result Value Ref Range   Sodium 135 135 - 145 mmol/L   Potassium 4.1 3.5 - 5.1 mmol/L    Chloride 96 (L) 98 - 111 mmol/L   CO2 27 22 - 32 mmol/L   Glucose, Bld 397 (H) 70 - 99 mg/dL    Comment: Glucose reference range  applies only to samples taken after fasting for at least 8 hours.   BUN 25 (H) 8 - 23 mg/dL   Creatinine, Ser 1.48 (H) 0.44 - 1.00 mg/dL   Calcium 9.1 8.9 - 10.3 mg/dL   Total Protein 7.4 6.5 - 8.1 g/dL   Albumin 3.3 (L) 3.5 - 5.0 g/dL   AST 17 15 - 41 U/L   ALT 13 0 - 44 U/L   Alkaline Phosphatase 138 (H) 38 - 126 U/L   Total Bilirubin 0.7 0.3 - 1.2 mg/dL   GFR, Estimated 38 (L) >60 mL/min    Comment: (NOTE) Calculated using the CKD-EPI Creatinine Equation (2021)    Anion gap 12 5 - 15    Comment: Performed at [facility], [address], [place], [place] [address]  Lipase, blood     Status: None   Collection Time: 05/17/20  9:18 AM  Result Value Ref Range   Lipase 18 11 - 51 U/L    Comment: Performed at [facility], [address] [place], [place] [address]  Troponin I (High Sensitivity)     Status: Abnormal   Collection Time: 05/17/20  9:18 AM  Result Value Ref Range   Troponin I (High Sensitivity) 27 (H) <18 ng/L    Comment: (NOTE) Elevated high sensitivity troponin I (hsTnI) values and significant  changes across serial measurements may suggest ACS but many other  chronic and acute conditions are known to elevate hsTnI results.  Refer to the "Links" section for chest pain algorithms and additional  guidance. Performed at [facility], [address], [place], [place] [address]   Lactic acid, plasma     Status: None   Collection Time: 05/17/20  9:19 AM  Result Value Ref Range   Lactic Acid, Venous 1.2 0.5 - 1.9 mmol/L    Comment: Performed at [facility], [address] [place], [place] [address]  Brain natriuretic peptide     Status: Abnormal   Collection Time: 05/17/20  9:20 AM  Result Value Ref Range   B Natriuretic Peptide 590.0 (H) 0.0 - 100.0 pg/mL    Comment: Performed at [facility], [address] [place], [place] [address]   Blood culture (routine x 2)     Status: None (Preliminary result)   Collection Time: 05/17/20  9:36 AM   Specimen: BLOOD  Result Value Ref Range   Specimen Description BLOOD BLOOD LEFT ARM    Special Requests      BOTTLES DRAWN AEROBIC AND ANAEROBIC Blood Culture adequate volume Performed at [facility], [address], [place], [place] [address]    Culture PENDING    Report Status PENDING   Blood culture (routine x 2)     Status: None (Preliminary result)   Collection Time: 05/17/20  9:41 AM   Specimen: BLOOD  Result Value Ref Range   Specimen Description BLOOD RIGHT ANTECUBITAL    Special Requests      BOTTLES DRAWN AEROBIC AND ANAEROBIC Blood Culture results may not be optimal due to an inadequate volume of blood received in culture bottles Performed at [facility], [address] [place], [place] [address]    Culture PENDING    Report Status PENDING   Lactic acid, plasma     Status: None   Collection Time: 05/17/20 11:41 AM  Result Value Ref Range   Lactic Acid, Venous 0.9 0.5 - 1.9 mmol/L    Comment: Performed at [facility], [address], [place], [place] [address]  Troponin I (  High Sensitivity)     Status: Abnormal   Collection Time: 05/17/20 11:50 AM  Result Value Ref Range   Troponin I (High Sensitivity) 23 (H) <18 ng/L    Comment: (NOTE) Elevated high sensitivity troponin I (hsTnI) values and significant  changes across serial measurements may suggest ACS but many other  chronic and acute conditions are known to elevate hsTnI results.  Refer to the "Links" section for chest pain algorithms and additional  guidance. Performed at [facility], [address] [place], [place] [address]   CBG monitoring, ED     Status: Abnormal   Collection Time: 05/17/20  1:42 PM  Result Value Ref Range   Glucose-Capillary 397 (H) 70 - 99 mg/dL    Comment: Glucose reference range applies only to samples taken after fasting for at least 8 hours.   *Note: Due to a large number  of results and/or encounters for the requested time period, some results have not been displayed. A complete set of results can be found in Results Review.   CT ABDOMEN PELVIS W CONTRAST  Result Date: 05/17/2020 CLINICAL DATA:  Abdominal pain EXAM: CT ABDOMEN AND PELVIS WITH CONTRAST TECHNIQUE: Multidetector CT imaging of the abdomen and pelvis was performed using the standard protocol following bolus administration of intravenous contrast. CONTRAST:  110mL OMNIPAQUE IOHEXOL 300 MG/ML  SOLN COMPARISON:  05/28/2019 FINDINGS: Lower chest: Small bilateral pleural effusions with bibasilar atelectasis/scarring. Hepatobiliary: Few calcified granulomas of the liver. Cholelithiasis. Mild intrahepatic duct dilatation. Common bile duct is normal in caliber. Pancreas: Unremarkable. Spleen: Small calcified granulomas.  Otherwise unremarkable. Adrenals/Urinary Tract: Adrenals are unremarkable. Too small to characterize low-attenuation lesion of the right kidney. Air within the bladder is presumably due to instrumentation. Stomach/Bowel: Stomach is within normal limits. Bowel is normal in caliber. Sigmoid diverticulosis. Vascular/Lymphatic: Aortic atherosclerosis. No enlarged lymph nodes identified. Reproductive: Status post hysterectomy. No adnexal masses. Other: No ascites.  Abdominal wall is unremarkable. Musculoskeletal: Degenerative changes of the included spine. No acute osseous abnormality. IMPRESSION: Mild intrahepatic duct dilatation. Cholelithiasis. Small bilateral pleural effusions. Electronically Signed   By: Macy Mis M.D.   On: 05/17/2020 12:44   DG Chest Portable 1 View  Result Date: 05/17/2020 CLINICAL DATA:  Cough and history of prior COVID-19 positivity EXAM: PORTABLE CHEST 1 VIEW COMPARISON:  03/07/2020 FINDINGS: Cardiac shadow is within normal limits. Aortic calcifications are noted. The lungs are well aerated bilaterally. Increased bilateral airspace opacities are noted when compare with the  prior exam consistent with the given clinical history of prior COVID-19 positivity. No sizable effusion is seen. No bony abnormality is noted. IMPRESSION: Increase in bilateral airspace opacities when compare with the prior exam consistent with the given clinical history. Electronically Signed   By: Inez Catalina M.D.   On: 05/17/2020 08:25    Pending Labs Unresulted Labs (From admission, onward)          Start     Ordered   05/17/20 1128  Urine Culture  Once,   STAT        05/17/20 1127          Vitals/Pain Today's Vitals   05/17/20 1130 05/17/20 1325 05/17/20 1400 05/17/20 1500  BP: (!) 192/94 (!) 188/95 (!) 183/97 (!) 194/103  Pulse: 90 92 98 98  Resp: 18 15 17 17   Temp:      TempSrc:      SpO2: 97% 98% 98% 98%  Weight:      Height:      PainSc:  Isolation Precautions No active isolations  Medications Medications  sodium chloride 0.9 % bolus 500 mL (0 mLs Intravenous Stopped 05/17/20 1044)  LORazepam (ATIVAN) injection 0.5 mg (0.5 mg Intravenous Given 05/17/20 1049)  prochlorperazine (COMPAZINE) injection 10 mg (10 mg Intravenous Given 05/17/20 1047)  iohexol (OMNIPAQUE) 300 MG/ML solution 75 mL (75 mLs Intravenous Contrast Given 05/17/20 1204)    Mobility walks with device Low fall risk   Focused Assessments    R Recommendations: See Admitting Provider Note  Report given to:   Additional Notes:

## 2020-05-17 NOTE — ED Provider Notes (Signed)
[facility] EMERGENCY DEPARTMENT Provider Note   CSN: [id number] Arrival date & time: 05/17/20  0751     History Chief Complaint  Patient presents with  . Nausea    Susan [name] is a 69 y.o. female.   Susan Freund Suckyis a 69 year old female with past medical history notable for chronic diastolic congestive heart failure, essential hypertension, type 2 diabetes mellitus, CKD stage IV, obesity, peripheral vascular disease, chronic neuropathy who presented to the ED via EMS with a 2-week history of generalized weakness, nausea, fatigue, diarrhea, intermittent vomiting and fever.  Reports unable to get out of bed for the past 1 week with generalized weakness and chills.  Fever to 101 yesterday.  2 episodes of diarrhea 2 days ago but none since.  Has had nausea all week long with intermittent vomiting.  Blood sugars have been elevated despite using her insulin pump at home.  Denies any travel or sick contacts.  Did have Covid in October.  Reports no shortness of breath on home oxygen.  Has had a nonproductive cough as well for the past 1 week.  Susan [name] denies any chest pain or shortness of breath.  Susan [name] denies any abdominal pain.  No pain with urination or blood in the urine.  Denies any black or bloody stools.  Feels weak all over with inability to get out of bed this morning.  Denies feeling dizzy or lightheaded.  The history is provided by the patient and the EMS personnel.       Past Medical History:  Diagnosis Date  . Acute MI ([place]) 1999; 2007  . Anemia    hx  . Anginal pain ([place])   . Anxiety   . ARF (acute renal failure) ([place]) 06/2017   [facility]  . Arthritis    "generalized" (03/15/2014)  . CAD (coronary artery disease)    MI in 2000 - MI  2007 - treated bare metal stent (no nuclear since then as 9/11)  . Carotid artery disease ([place])   . CHF (congestive heart failure) ([place])   . Chronic diastolic heart failure (HCC)    a) ECHO (08/2013) EF 55-60% and RV function nl  b) RHC (08/2013) RA 4, RV 30/5/7, PA 25/10 (16), PCWP 7, Fick CO/CI 6.3/2.7, PVR 1.5 WU, PA 61 and 66%  . Daily headache    "~ every other day; since I fell in June" (03/15/2014)  . Depression   . Dyslipidemia   . Dyspnea   . Exertional shortness of breath   . HTN (hypertension)   . Hypothyroidism   . Neuropathy   . Obesity   . Osteoarthritis   . Peripheral neuropathy   . PONV (postoperative nausea and vomiting)   . RBBB (right bundle branch block)    Old  . Stroke [facility])    mini strokes  . Syncope    likely due to low blood sugar  . Tachycardia    Sinus tachycardia  . Type II diabetes mellitus (HCC)    Type II  . Urinary incontinence   . Venous insufficiency     Patient Active Problem List   Diagnosis Date Noted  . Acute on chronic diastolic CHF (congestive heart failure) ([place]) 03/07/2020  . Obesity, Class III, BMI 40-49.9 (morbid obesity) ([place]) 03/07/2020  . Enteritis, enteropathogenic E. coli 02/10/2020  . Common bile duct (CBD) obstruction 05/28/2019  . Benign neoplasm of ascending colon   . Benign neoplasm of transverse colon   . Benign neoplasm of descending colon   .  Benign neoplasm of sigmoid colon   . Gastric polyps   . Hyperkalemia 03/11/2019  . Prolonged QT interval 03/11/2019  . Onychomycosis 06/21/2018  . Syncope 03/18/2018  . Osteomyelitis of second toe of right foot ([place])   . 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 ([place])   . PVD (peripheral vascular disease) ([place]) 10/26/2017  . Hypothyroid 07/27/2017  . PAH (pulmonary artery hypertension) ([place])   . Impaired ambulation 07/19/2017  . Leg cramps 02/27/2017  . Peripheral edema 01/12/2017  . Diabetic neuropathy ([place]) 11/12/2016  . 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 mellitus ([place]) 10/24/2015  . Anemia 10/03/2015  . Generalized anxiety disorder 10/03/2015  . Insomnia 10/03/2015  . Chest pain   . Diabetes mellitus type II, uncontrolled ([place]) 06/07/2015  . Chronic diastolic CHF  (congestive heart failure) ([place]) 06/07/2015  . Non compliance with medical treatment 04/17/2014  . Rotator cuff tear 03/14/2014  . Class 3 obesity 09/23/2013  . Hypotension 12/25/2012  . Urinary incontinence   . MDD (major depressive disorder) 11/12/2010  . RBBB (right bundle branch block)   . CAD (coronary artery disease)   . Hyperlipemia 01/22/2009  . Essential hypertension 01/22/2009    Past Surgical History:  Procedure Laterality Date  . ABDOMINAL HYSTERECTOMY  1980's  . AMPUTATION Right 02/24/2018   Procedure: RIGHT FOOT GREAT TOE AND 2ND TOE AMPUTATION;  Surgeon: Newt Minion, MD;  Location: [place];  Service: Orthopedics;  Laterality: Right;  . AMPUTATION Right 04/30/2018   Procedure: RIGHT TRANSMETATARSAL AMPUTATION;  Surgeon: Newt Minion, MD;  Location: [place];  Service: Orthopedics;  Laterality: Right;  . CATARACT EXTRACTION, BILATERAL Bilateral ?2013  . COLONOSCOPY W/ POLYPECTOMY    . COLONOSCOPY WITH PROPOFOL N/A 03/13/2019   Procedure: COLONOSCOPY WITH PROPOFOL;  Surgeon: Jerene Bears, MD;  Location: [place];  Service: Gastroenterology;  Laterality: N/A;  . [place]; 2007   "1 + 1"  . ESOPHAGOGASTRODUODENOSCOPY (EGD) WITH PROPOFOL N/A 03/13/2019   Procedure: ESOPHAGOGASTRODUODENOSCOPY (EGD) WITH PROPOFOL;  Surgeon: Jerene Bears, MD;  Location: [facility] ENDOSCOPY;  Service: Gastroenterology;  Laterality: N/A;  . EYE SURGERY Bilateral    [place]  . HEMOSTASIS CLIP PLACEMENT  03/13/2019   Procedure: HEMOSTASIS CLIP PLACEMENT;  Surgeon: Jerene Bears, MD;  Location: [facility] ENDOSCOPY;  Service: Gastroenterology;;  . KNEE ARTHROSCOPY Left 10/25/2006  . POLYPECTOMY  03/13/2019   Procedure: POLYPECTOMY;  Surgeon: Jerene Bears, MD;  Location: [place];  Service: Gastroenterology;;  . RIGHT HEART CATH N/A 07/24/2017   Procedure: RIGHT HEART CATH;  Surgeon: Jolaine Artist, MD;  Location: [place] CV LAB;  Service: Cardiovascular;   Laterality: N/A;  . RIGHT HEART CATHETERIZATION N/A 09/22/2013   Procedure: RIGHT HEART CATH;  Surgeon: Jolaine Artist, MD;  Location: [facility] CATH LAB;  Service: Cardiovascular;  Laterality: N/A;  . SHOULDER ARTHROSCOPY WITH OPEN ROTATOR CUFF REPAIR Right 03/14/2014   Procedure: RIGHT SHOULDER ARTHROSCOPY WITH BICEPS RELEASE, OPEN SUBSCAPULA REPAIR, OPEN SUPRASPINATUS REPAIR.;  Surgeon: Meredith Pel, MD;  Location: [place];  Service: Orthopedics;  Laterality: Right;  . TOE AMPUTATION Right 02/24/2018   GREAT TOE AND 2ND TOE AMPUTATION  . TUBAL LIGATION  1970's     OB History   No obstetric history on file.     Family History  Problem Relation Age of Onset  . Heart attack Mother 27    Social History   Tobacco Use  . Smoking  status: Former Smoker    Packs/day: 3.00    Years: 32.00    Pack years: 96.00    Types: Cigarettes    Quit date: 10/24/1997    Years since quitting: 22.5  . Smokeless tobacco: Never Used  Vaping Use  . Vaping Use: Never used  Substance Use Topics  . Alcohol use: Not Currently    Comment: "might have 2-3 daiquiris in the summer"  . Drug use: No    Home Medications Prior to Admission medications   Medication Sig Start Date End Date Taking? Authorizing Provider  albuterol (VENTOLIN HFA) 108 (90 Base) MCG/ACT inhaler TAKE 2 PUFFS BY MOUTH EVERY 6 HOURS AS NEEDED FOR WHEEZE OR SHORTNESS OF BREATH Patient taking differently: Inhale 2 puffs into the lungs every 6 (six) hours as needed for wheezing or shortness of breath.  06/27/19   [place], [name], MD  allopurinol (ZYLOPRIM) 100 MG tablet TAKE 1 TABLET BY MOUTH TWICE A DAY Patient taking differently: Take 100 mg by mouth 2 (two) times daily.  08/02/19   Newt Minion, MD  Ascorbic Acid (VITAMIN C) 1000 MG tablet Take 1,000 mg by mouth daily.    [provider]  aspirin EC 81 MG tablet Take 81 mg by mouth every morning.     [provider]  carvedilol (COREG) 12.5 MG tablet Take 1 tablet  (12.5 mg total) by mouth 2 (two) times daily with a meal. 02/13/20   Henry, [name], MD  cholecalciferol (VITAMIN D) 1000 units tablet Take 1,000 Units by mouth daily with supper.     [provider]  clopidogrel (PLAVIX) 75 MG tablet TAKE 1 TABLET BY MOUTH EVERY DAY WITH BREAKFAST Patient taking differently: Take 75 mg by mouth daily.  05/11/19   Clegg, Amy D, NP  escitalopram (LEXAPRO) 20 MG tablet TAKE 1 TABLET BY MOUTH EVERY DAY 03/19/20   , [name], MD  ferrous sulfate 325 (65 FE) MG tablet Take 1 tablet (325 mg total) by mouth 2 (two) times daily with a meal. Reported on 08/21/2015 06/07/18   Alycia Rossetti, MD  guaiFENesin-dextromethorphan [facility] DM) 100-10 MG/5ML syrup Take 10 mLs by mouth every 4 (four) hours as needed for cough. 03/13/20   [place] Donnamarie Poag, DO  HYDROcodone-acetaminophen (NORCO) 5-325 MG tablet Take 1 tablet by mouth every 8 (eight) hours as needed for moderate pain. 04/04/20   Alycia Rossetti, MD  Insulin Disposable Pump (OMNIPOD DASH 5 PACK PODS) MISC Inject into the skin See admin instructions. Use continuously with Novolin R - change every 72 hours. 11/18/18   [provider]  insulin NPH Human (HUMULIN N,NOVOLIN N) 100 UNIT/ML injection Inject 10-15 Units into the skin See admin instructions. Inject 15 units subcutaneously in the morning if CBG >300; inject 10 units 200-300    [provider]  insulin regular (NOVOLIN R) 100 units/mL injection Inject into the skin See admin instructions. Manually add bolus to continuous dose via OmniPod 3 times daily per sliding scale (CBG 80-150 7 units, 151-200 9 units, 201-250 12 units, 251-300 14 units, 301- 400 17 units)    [provider]  levothyroxine (SYNTHROID, LEVOTHROID) 50 MCG tablet Take 1 tablet (50 mcg total) by mouth daily before breakfast. 11/07/16   Dena Billet B, PA-C  magnesium oxide (MAG-OX) 400 MG tablet TAKE 1 TABLET BY MOUTH EVERY DAY Patient taking differently:  Take 400 mg by mouth daily.  09/13/19   Alycia Rossetti, MD  nitroGLYCERIN (NITROSTAT)  0.4 MG SL tablet Place 1 tablet (0.4 mg total) under the tongue every 5 (five) minutes as needed for chest pain. Patient taking differently: Place 0.4 mg under the tongue every 5 (five) minutes as needed for chest pain (max 3 doses).  04/28/19   Clegg, Amy D, NP  ondansetron (ZOFRAN-ODT) 8 MG disintegrating tablet Take 1 tablet (8 mg total) by mouth 3 (three) times daily. Patient taking differently: Take 8 mg by mouth 3 (three) times daily as needed for nausea or vomiting.  02/27/20   [place], [name], MD  pantoprazole (PROTONIX) 40 MG tablet TAKE 1 TABLET BY MOUTH EVERY DAY Patient taking differently: Take 40 mg by mouth daily.  01/04/20   [place], [name], MD  pregabalin (LYRICA) 150 MG capsule Take 1 capsule (150 mg total) by mouth 2 (two) times daily. 11/12/16   Alycia Rossetti, MD  torsemide (DEMADEX) 20 MG tablet Take 2 tablets (40 mg total) by mouth 2 (two) times daily. Patient taking differently: Take 60 mg by mouth 2 (two) times daily.  02/10/20   [place], Marlowe Aschoff, MD  traZODone (DESYREL) 100 MG tablet TAKE 1 AND 1/2 TABLETS BY MOUTH AT BEDTIME AS NEEDED FOR SLEEP. Patient taking differently: Take 100 mg by mouth at bedtime.  01/26/20   Alycia Rossetti, MD  ursodiol (ACTIGALL) 500 MG tablet Take 500 mg by mouth 3 (three) times daily. 09/09/19   [provider]  vitamin E (VITAMIN E) 1000 UNIT capsule Take 1,000 Units by mouth daily with supper.     [provider]    Allergies    Codeine  Review of Systems   Review of Systems  Constitutional: Positive for activity change, appetite change, chills, fatigue and fever.  Respiratory: Positive for cough. Negative for shortness of breath.   Cardiovascular: Negative for chest pain.  Gastrointestinal: Positive for abdominal pain, diarrhea, nausea and vomiting.  Genitourinary: Negative for dysuria and hematuria.  Musculoskeletal: Negative for  arthralgias, back pain and myalgias.  Skin: Positive for wound. Negative for rash.  Neurological: Positive for weakness and headaches. Negative for dizziness and light-headedness.   all other systems are negative except as noted in the HPI and PMH.    Physical Exam Updated Vital Signs BP 108/69 (BP Location: Left Arm)   Temp 98.8 F (37.1 C) (Oral)   Resp 18   Ht 5\' 6"  (1.676 m)   Wt 120.2 kg   BMI 42.77 kg/m   Physical Exam Vitals and nursing note reviewed.  Constitutional:      General: Susan [name] is not in acute distress.    Appearance: Susan [name] is well-developed and well-nourished. Susan [name] is ill-appearing.     Comments: Mildly dry mucous membranes  HENT:     Head: Normocephalic and atraumatic.     Mouth/Throat:     Mouth: Oropharynx is clear and moist. Mucous membranes are dry.     Pharynx: No oropharyngeal exudate.  Eyes:     Extraocular Movements: EOM normal.     Conjunctiva/sclera: Conjunctivae normal.     Pupils: Pupils are equal, round, and reactive to light.  Neck:     Comments: No meningismus. Cardiovascular:     Rate and Rhythm: Normal rate and regular rhythm.     Pulses: Intact distal pulses.     Heart sounds: Normal heart sounds. No murmur heard.   Pulmonary:     Effort: Pulmonary effort is normal. No respiratory distress.     Breath sounds: Normal breath sounds.  Comments: Diminished at the bases Abdominal:     Palpations: Abdomen is soft.     Tenderness: There is abdominal tenderness. There is no guarding or rebound.     Comments: Mild diffuse tenderness  Genitourinary:    Comments: Ulcerations to gluteal cleft bilaterally Musculoskeletal:        General: No tenderness or edema. Normal range of motion.     Cervical back: Normal range of motion and neck supple.  Skin:    General: Skin is warm.  Neurological:     General: No focal deficit present.     Mental Status: Susan [name] is alert and oriented to person, place, and time.     Cranial Nerves: No cranial nerve  deficit.     Motor: No abnormal muscle tone.     Coordination: Coordination normal.     Comments:  5/5 strength throughout. CN 2-12 intact.Equal grip strength.   Psychiatric:        Mood and Affect: Mood and affect normal.        Behavior: Behavior normal.     ED Results / Procedures / Treatments   Labs (all labs ordered are listed, but only abnormal results are displayed) Labs Reviewed  CBC WITH DIFFERENTIAL/PLATELET - Abnormal; Notable for the following components:      Result Value   WBC 11.5 (*)    MCHC 29.9 (*)    Neutro Abs 10.0 (*)    Lymphs Abs 0.5 (*)    All other components within normal limits  COMPREHENSIVE METABOLIC PANEL - Abnormal; Notable for the following components:   Chloride 96 (*)    Glucose, Bld 397 (*)    BUN 25 (*)    Creatinine, Ser 1.48 (*)    Albumin 3.3 (*)    Alkaline Phosphatase 138 (*)    GFR, Estimated 38 (*)    All other components within normal limits  BRAIN NATRIURETIC PEPTIDE - Abnormal; Notable for the following components:   B Natriuretic Peptide 590.0 (*)    All other components within normal limits  URINALYSIS, ROUTINE W REFLEX MICROSCOPIC - Abnormal; Notable for the following components:   Glucose, UA >=500 (*)    Ketones, ur 5 (*)    Protein, ur >=300 (*)    Leukocytes,Ua SMALL (*)    Bacteria, UA RARE (*)    All other components within normal limits  CBG MONITORING, ED - Abnormal; Notable for the following components:   Glucose-Capillary 383 (*)    All other components within normal limits  CBG MONITORING, ED - Abnormal; Notable for the following components:   Glucose-Capillary 397 (*)    All other components within normal limits  TROPONIN I (HIGH SENSITIVITY) - Abnormal; Notable for the following components:   Troponin I (High Sensitivity) 27 (*)    All other components within normal limits  TROPONIN I (HIGH SENSITIVITY) - Abnormal; Notable for the following components:   Troponin I (High Sensitivity) 23 (*)    All other  components within normal limits  RESP PANEL BY RT-PCR (FLU A&B, COVID) ARPGX2  CULTURE, BLOOD (ROUTINE X 2)  CULTURE, BLOOD (ROUTINE X 2)  URINE CULTURE  LIPASE, BLOOD  LACTIC ACID, PLASMA  LACTIC ACID, PLASMA  CBG MONITORING, ED    EKG EKG Interpretation  Date/Time:  Thursday May 17 2020 08:08:09 EST Ventricular Rate:  98 PR Interval:    QRS Duration: 113 QT Interval:  411 QTC Calculation: 525 R Axis:   -23 Text Interpretation: Sinus rhythm Incomplete right  bundle branch block Inferior infarct, old Prolonged QT interval Artifact in lead(s) V2 Interpretation limited secondary to artifact No significant change was found Confirmed by Ezequiel Essex [phone]) on 05/17/2020 8:13:32 AM   Radiology DG Chest Portable 1 View  Result Date: 05/17/2020 CLINICAL DATA:  Cough and history of prior COVID-19 positivity EXAM: PORTABLE CHEST 1 VIEW COMPARISON:  03/07/2020 FINDINGS: Cardiac shadow is within normal limits. Aortic calcifications are noted. The lungs are well aerated bilaterally. Increased bilateral airspace opacities are noted when compare with the prior exam consistent with the given clinical history of prior COVID-19 positivity. No sizable effusion is seen. No bony abnormality is noted. IMPRESSION: Increase in bilateral airspace opacities when compare with the prior exam consistent with the given clinical history. Electronically Signed   By: Inez Catalina M.D.   On: 05/17/2020 08:25    Procedures Procedures (including critical care time)  Medications Ordered in ED Medications  sodium chloride 0.9 % bolus 500 mL (has no administration in time range)    ED Course  I have reviewed the triage vital signs and the nursing notes.  Pertinent labs & imaging results that were available during my care of the patient were reviewed by me and considered in my medical decision making (see chart for details).    MDM Rules/Calculators/A&P                         Patient from Home with  a 2-week history of fatigue, diarrhea, nausea, abdominal pain, cough and generalized weakness.  Vitals are stable on arrival.  No fever.  We will treat symptoms and gently hydrate.  Check labs.  Blood sugar 383 on arrival.  QTc 525 on EKG. Avoiding zofran and other QT prolonging agents.  Labs show hyperglycemia without DKA Covid is negative  UA shows pyuria will send for culture.  Patient with no UTI symptoms. X-ray shows stable basilar opacities, mildly increased.  Labs otherwise reassuring without DKA or other abnormality. Troponins flat. CT of abdomen pelvis shows no acute findings.  Unclear etiology of patient's symptoms.  Suspect viral syndrome.  Possibly gastroparesis. There is no evidence of DKA.  Patient able to ambulate without desaturation. No increased work of breathing.  Able to tolerate PO but nauseated.  Patient very weak with attempted ambulation and unsteady.  Susan [name] is worried about going home and would like to be observed overnight.  D/w Dr. Manuella Ghazi who will evaluate.   Susan [name] was evaluated in Emergency Department on 05/17/2020 for the symptoms described in the history of present illness. Susan [name] was evaluated in the context of the global COVID-19 pandemic, which necessitated consideration that the patient might be at risk for infection with the SARS-CoV-2 virus that causes COVID-19. Institutional protocols and algorithms that pertain to the evaluation of patients at risk for COVID-19 are in a state of rapid change based on information released by regulatory bodies including the CDC and federal and state organizations. These policies and algorithms were followed during the patient's care in the ED.  Final Clinical Impression(s) / ED Diagnoses Final diagnoses:  Non-intractable vomiting with nausea, unspecified vomiting type  Hyperglycemia    Rx / DC Orders ED Discharge Orders    None       Shakaya Bhullar, Annie Main, MD 05/17/20 1710

## 2020-05-17 NOTE — ED Triage Notes (Signed)
Pt c/o of nausea, diarrhea, abdominal pain and cough x 3 days.

## 2020-05-18 DIAGNOSIS — R112 Nausea with vomiting, unspecified: Secondary | ICD-10-CM | POA: Diagnosis not present

## 2020-05-18 LAB — GLUCOSE, CAPILLARY
Glucose-Capillary: 120 mg/dL — ABNORMAL HIGH (ref 70–99)
Glucose-Capillary: 139 mg/dL — ABNORMAL HIGH (ref 70–99)
Glucose-Capillary: 126 mg/dL — ABNORMAL HIGH (ref 70–99)
Glucose-Capillary: 161 mg/dL — ABNORMAL HIGH (ref 70–99)
Glucose-Capillary: 239 mg/dL — ABNORMAL HIGH (ref 70–99)

## 2020-05-18 LAB — COMPREHENSIVE METABOLIC PANEL
ALT: 8 U/L (ref 0–44)
AST: 11 U/L — ABNORMAL LOW (ref 15–41)
Albumin: 2.5 g/dL — ABNORMAL LOW (ref 3.5–5.0)
Alkaline Phosphatase: 97 U/L (ref 38–126)
Anion gap: 11 (ref 5–15)
BUN: 28 mg/dL — ABNORMAL HIGH (ref 8–23)
CO2: 26 mmol/L (ref 22–32)
Calcium: 8.5 mg/dL — ABNORMAL LOW (ref 8.9–10.3)
Chloride: 99 mmol/L (ref 98–111)
Creatinine, Ser: 1.78 mg/dL — ABNORMAL HIGH (ref 0.44–1.00)
GFR, Estimated: 30 mL/min — ABNORMAL LOW (ref >60)
Glucose, Bld: 123 mg/dL — ABNORMAL HIGH (ref 70–99)
Potassium: 3.9 mmol/L (ref 3.5–5.1)
Sodium: 136 mmol/L (ref 135–145)
Total Bilirubin: 0.6 mg/dL (ref 0.3–1.2)
Total Protein: 5.5 g/dL — ABNORMAL LOW (ref 6.5–8.1)

## 2020-05-18 LAB — CBC
HCT: 34.1 % — ABNORMAL LOW (ref 36.0–46.0)
Hemoglobin: 10.4 g/dL — ABNORMAL LOW (ref 12.0–15.0)
MCH: 26.2 pg (ref 26.0–34.0)
MCHC: 30.5 g/dL (ref 30.0–36.0)
MCV: 85.9 fL (ref 80.0–100.0)
Platelets: 276 10*3/uL (ref 150–400)
RBC: 3.97 MIL/uL (ref 3.87–5.11)
RDW: 15.4 % (ref 11.5–15.5)
WBC: 7.9 10*3/uL (ref 4.0–10.5)
nRBC: 0 % (ref 0.0–0.2)

## 2020-05-18 LAB — MAGNESIUM: Magnesium: 2.1 mg/dL (ref 1.7–2.4)

## 2020-05-18 NOTE — Plan of Care (Signed)

## 2020-05-18 NOTE — Evaluation (Signed)
Physical Therapy Evaluation Patient Details Name: Susan [name] MRN: [id number] DOB: [birth year] Today's Date: 05/18/2020   History of Present Illness  Susan [name] is a 69 y.o. female with medical history significant for chronic diastolic congestive heart failure, essential hypertension, type 2 diabetes mellitus, CKD stage IV, obesity, peripheral vascular disease, and chronic neuropathy who presented to the ED via EMS with 2-week history of generalized weakness, diarrhea, fatigue, nausea, intermittent vomiting, and fever.  She was noted to have a fever of 101 Fahrenheit yesterday along with 2 episodes of diarrhea.  Blood sugars have also been elevated despite using her insulin pump at home.  She did have Covid back in October but denies any new travel or sick contacts.  She denies any shortness of breath or chest pain.  She is also noted to have a nonproductive cough for the last 1 week and has difficulty getting up any sputum.  She denies any abdominal pain or changes to her stool consistency or color.    Clinical Impression  Patient functioning near baseline for functional mobility and gait, demonstrates good return for transferring to commode in bathroom with use of grab bar, able to ambulate in hallway without loss of balance, limited mostly due to SOB with SpO2 dropping from 95% to 86% on room air, increased to 95% when put on 2 LPM to ambulate back to room.  Patient tolerated sitting up in chair after therapy and encouraged to ambulate daily as tolerated when return home and use supplemental O2 when SOB.  Plan:  Patient discharged from physical therapy to care of nursing for ambulation daily as tolerated for length of stay.      Follow Up Recommendations No PT follow up;Supervision - Intermittent    Equipment Recommendations  None recommended by PT    Recommendations for Other Services       Precautions / Restrictions Precautions Precautions: None Restrictions Weight Bearing  Restrictions: No      Mobility  Bed Mobility Overal bed mobility: Modified Independent             General bed mobility comments: increased time, slightly labored movement with head of raised approximately 30 degrees    Transfers Overall transfer level: Modified independent Equipment used: Rolling walker (2 wheeled)             General transfer comment: demonstrates good return for transferring to commode in bathroom using grab bar  Ambulation/Gait Ambulation/Gait assistance: Modified independent (Device/Increase time) Gait Distance (Feet): 100 Feet Assistive device: Rolling walker (2 wheeled) Gait Pattern/deviations: Decreased step length - left;Decreased step length - right;Decreased stride length Gait velocity: decreased   General Gait Details: slightly labored slow cadence without loss of balance, limited mostly due to SOB, on room air with SpO2 dropping from 95% to 86%, put on 2 LPM to return to room with SpO2 increasing to 95%  Stairs            Wheelchair Mobility    Modified Rankin (Stroke Patients Only)       Balance Overall balance assessment: Needs assistance Sitting-balance support: Feet supported;No upper extremity supported Sitting balance-Leahy Scale: Good Sitting balance - Comments: seated at EOB   Standing balance support: During functional activity;Bilateral upper extremity supported Standing balance-Leahy Scale: Fair Standing balance comment: fair/good using RW                             Pertinent Vitals/Pain Pain Assessment: No/denies pain  Home Living Family/patient expects to be discharged to:: Private residence Living Arrangements: Spouse/significant other Available Help at Discharge: Family;Available PRN/intermittently Type of Home: Mobile home Home Access: Ramped entrance     Home Layout: One level Home Equipment: Grab bars - tub/shower;Walker - 2 wheels;Walker - 4 wheels;Wheelchair - manual;Bedside  commode;Cane - quad      Prior Function Level of Independence: Independent with assistive device(s)         Comments: household and short distanced community ambulator using SPC most of time and RW as needed     Hand Dominance   Dominant Hand: Right    Extremity/Trunk Assessment   Upper Extremity Assessment Upper Extremity Assessment: Overall WFL for tasks assessed    Lower Extremity Assessment Lower Extremity Assessment: Overall WFL for tasks assessed    Cervical / Trunk Assessment Cervical / Trunk Assessment: Normal  Communication   Communication: No difficulties  Cognition Arousal/Alertness: Awake/alert Behavior During Therapy: WFL for tasks assessed/performed Overall Cognitive Status: Within Functional Limits for tasks assessed                                        General Comments      Exercises     Assessment/Plan    PT Assessment Patent does not need any further PT services  PT Problem List         PT Treatment Interventions      PT Goals (Current goals can be found in the Care Plan section)  Acute Rehab PT Goals Patient Stated Goal: return home with family to assist PT Goal Formulation: With patient Time For Goal Achievement: 05/18/20 Potential to Achieve Goals: Good    Frequency     Barriers to discharge        Co-evaluation               AM-PAC PT "6 Clicks" Mobility  Outcome Measure Help needed turning from your back to your side while in a flat bed without using bedrails?: None Help needed moving from lying on your back to sitting on the side of a flat bed without using bedrails?: None Help needed moving to and from a bed to a chair (including a wheelchair)?: None Help needed standing up from a chair using your arms (e.g., wheelchair or bedside chair)?: None Help needed to walk in hospital room?: None Help needed climbing 3-5 steps with a railing? : A Little 6 Click Score: 23    End of Session Equipment  Utilized During Treatment: Oxygen Activity Tolerance: Patient tolerated treatment well;Patient limited by fatigue Patient left: in chair;with call bell/phone within reach Nurse Communication: Mobility status PT Visit Diagnosis: Unsteadiness on feet (R26.81);Other abnormalities of gait and mobility (R26.89);Muscle weakness (generalized) (M62.81)    Time: 1020-1045 PT Time Calculation (min) (ACUTE ONLY): 25 min   Charges:   PT Evaluation $PT Eval Moderate Complexity: 1 Mod PT Treatments $Therapeutic Activity: 23-37 mins        11:24 AM, 05/18/20 Lonell Grandchild, MPT Physical Therapist with [facility] [phone] office [phone] mobile phone

## 2020-05-18 NOTE — Care Management Important Message (Signed)
Important Message  Patient Details  Name: Susan [name] MRN: [id number] Date of Birth: February 24, 1951   Medicare Important Message Given:  Yes     Sherie Don, LCSW 05/18/2020, 12:20 PM

## 2020-05-18 NOTE — Care Management Obs Status (Signed)
[facility] NOTIFICATION   Patient Details  Name: Antonya Leeder MRN: [id number] Date of Birth: Jul 07, 1950   Medicare Observation Status Notification Given:  Yes    Iona Beard, Latanya Presser 05/18/2020, 9:44 AM

## 2020-05-18 NOTE — Progress Notes (Signed)
Pt tolerated her lunch of [place] and broccoli without any n/v or diarrhea. Pt discharged to POV via [place] with all personal belongings in her possessions. Husband and pt verbalized understanding of discharge instructions and wound care/pressure relieving information.

## 2020-05-18 NOTE — Discharge Summary (Signed)
Physician Discharge Summary  Susan [name] CBS:[id number] DOB: 11/08/1950 DOA: 05/17/2020  PCP: Alycia Rossetti, MD  Admit date: 05/17/2020  Discharge date: 05/18/2020  Admitted From:Home  Disposition:  Home  Recommendations for Outpatient Follow-up:  1. Follow up with PCP in 1-2 weeks 2. Continue on home medications as prior to include Zofran as needed for nausea or vomiting 3. Continue other home medications as before  Home Health: None  Equipment/Devices: None, has home 2 L nasal cannula at bedtime as needed  Discharge Condition:Stable  CODE STATUS: Full  Diet recommendation: Heart Healthy/carb modified  Brief/Interim Summary: Per HPI: Susan [name] is a 69 y.o. female with medical history significant for chronic diastolic congestive heart failure, essential hypertension, type 2 diabetes mellitus, CKD stage IV, obesity, peripheral vascular disease, and chronic neuropathy who presented to the ED via EMS with 2-week history of generalized weakness, diarrhea, fatigue, nausea, intermittent vomiting, and fever.  She was noted to have a fever of 101 Fahrenheit yesterday along with 2 episodes of diarrhea.  Blood sugars have also been elevated despite using her insulin pump at home.  She did have Covid back in October but denies any new travel or sick contacts.  She denies any shortness of breath or chest pain.  She is also noted to have a nonproductive cough for the last 1 week and has difficulty getting up any sputum.  She denies any abdominal pain or changes to her stool consistency or color.   ED Course: Vital signs stable, but elevated blood pressure readings and blood glucose levels noted.  Creatinine 1.48 which is consistent with her prior.  WBC of 11.5.  CT of the abdomen pelvis with mild intrahepatic ductal dilation noted and chest x-ray with some bilateral interstitial opacities noted that appears stable from previous.  Covid testing noted to be negative.  Patient is in sinus  rhythm on EKG.  -Patient was admitted with symptoms of generalized weakness along with intractable nausea, vomiting, diarrhea with symptoms that appear to be related to viral gastroenteritis versus some possible diabetic gastroparesis.  She was also noted to have some uncontrolled hypertension as well as hyperglycemia in the setting of type 2 diabetes that have now improved with treatment.  She is overall feeling much better on this day of discharge and is able to tolerate a soft diet.  She is stable for discharge and is able to tolerate eating and has no further nausea, vomiting, or diarrhea and is eager for discharge.  No significant findings noted during this hospitalization.  She has improved considerably with use of IV antiemetics.  Discharge Diagnoses:  Active Problems:   Intractable nausea and vomiting   Pressure injury of skin  Principal discharge diagnosis: Symptomatic acute gastroenteritis.  Discharge Instructions  Discharge Instructions    Diet - low sodium heart healthy   Complete by: As directed    Discharge wound care:   Complete by: As directed    Stage 1-2 ulcers noted. Continue daily wound care as prior.   Increase activity slowly   Complete by: As directed      Allergies as of 05/18/2020      Reactions   Codeine Nausea And Vomiting      Medication List    STOP taking these medications   escitalopram 20 MG tablet Commonly known as: LEXAPRO     TAKE these medications   albuterol 108 (90 Base) MCG/ACT inhaler Commonly known as: VENTOLIN HFA TAKE 2 PUFFS BY MOUTH EVERY 6 HOURS AS NEEDED  FOR WHEEZE OR SHORTNESS OF BREATH What changed: See the new instructions.   allopurinol 100 MG tablet Commonly known as: ZYLOPRIM TAKE 1 TABLET BY MOUTH TWICE A DAY   aspirin EC 81 MG tablet Take 81 mg by mouth every morning.   carvedilol 12.5 MG tablet Commonly known as: COREG Take 1 tablet (12.5 mg total) by mouth 2 (two) times daily with a meal.   cholecalciferol  1000 units tablet Commonly known as: VITAMIN D Take 1,000 Units by mouth daily with supper.   clopidogrel 75 MG tablet Commonly known as: PLAVIX TAKE 1 TABLET BY MOUTH EVERY DAY WITH BREAKFAST What changed: See the new instructions.   ferrous sulfate 325 (65 FE) MG tablet Take 1 tablet (325 mg total) by mouth 2 (two) times daily with a meal. Reported on 08/21/2015   guaiFENesin-dextromethorphan 100-10 MG/5ML syrup Commonly known as: ROBITUSSIN DM Take 10 mLs by mouth every 4 (four) hours as needed for cough.   HYDROcodone-acetaminophen 5-325 MG tablet Commonly known as: Norco Take 1 tablet by mouth every 8 (eight) hours as needed for moderate pain.   insulin NPH Human 100 UNIT/ML injection Commonly known as: NOVOLIN N Inject 10-15 Units into the skin See admin instructions. Inject 15 units subcutaneously in the morning if CBG >300; inject 10 units 200-300   insulin regular 100 units/mL injection Commonly known as: NOVOLIN R Inject into the skin See admin instructions. Manually add bolus to continuous dose via OmniPod 3 times daily per sliding scale (CBG 80-150 7 units, 151-200 9 units, 201-250 12 units, 251-300 14 units, 301- 400 17 units)   levothyroxine 50 MCG tablet Commonly known as: SYNTHROID Take 1 tablet (50 mcg total) by mouth daily before breakfast.   magnesium oxide 400 MG tablet Commonly known as: MAG-OX TAKE 1 TABLET BY MOUTH EVERY DAY   metolazone 2.5 MG tablet Commonly known as: ZAROXOLYN Take 2.5 mg by mouth See admin instructions. Take 1 tablet by mouth on Monday and Fridays   nitroGLYCERIN 0.4 MG SL tablet Commonly known as: NITROSTAT Place 1 tablet (0.4 mg total) under the tongue every 5 (five) minutes as needed for chest pain. What changed: reasons to take this   OmniPod Dash 5 Pack Pods Misc Inject into the skin See admin instructions. Use continuously with Novolin R - change every 72 hours.   ondansetron 8 MG disintegrating tablet Commonly known  as: ZOFRAN-ODT Take 1 tablet (8 mg total) by mouth 3 (three) times daily. What changed:   when to take this  reasons to take this   pantoprazole 40 MG tablet Commonly known as: PROTONIX TAKE 1 TABLET BY MOUTH EVERY DAY   pregabalin 150 MG capsule Commonly known as: Lyrica Take 1 capsule (150 mg total) by mouth 2 (two) times daily.   torsemide 20 MG tablet Commonly known as: DEMADEX Take 2 tablets (40 mg total) by mouth 2 (two) times daily. What changed: how much to take   traZODone 100 MG tablet Commonly known as: DESYREL TAKE 1 AND 1/2 TABLETS BY MOUTH AT BEDTIME AS NEEDED FOR SLEEP. What changed: See the new instructions.   ursodiol 500 MG tablet Commonly known as: ACTIGALL Take 500 mg by mouth 3 (three) times daily.   vitamin C 1000 MG tablet Take 1,000 mg by mouth daily.   vitamin E 1000 UNIT capsule Take 1,000 Units by mouth daily with supper.            Discharge Care Instructions  (From admission, onward)  Start     Ordered   05/18/20 0000  Discharge wound care:       Comments: Stage 1-2 ulcers noted. Continue daily wound care as prior.   05/18/20 1102          Follow-up Information    Starkweather, [name], MD Follow up in 1 week(s).   Specialty: Family Medicine Contact information: [address] [place] [address] [phone]              Allergies  Allergen Reactions  . Codeine Nausea And Vomiting    Consultations:  None   Procedures/Studies: CT ABDOMEN PELVIS W CONTRAST  Result Date: 05/17/2020 CLINICAL DATA:  Abdominal pain EXAM: CT ABDOMEN AND PELVIS WITH CONTRAST TECHNIQUE: Multidetector CT imaging of the abdomen and pelvis was performed using the standard protocol following bolus administration of intravenous contrast. CONTRAST:  59mL OMNIPAQUE IOHEXOL 300 MG/ML  SOLN COMPARISON:  05/28/2019 FINDINGS: Lower chest: Small bilateral pleural effusions with bibasilar atelectasis/scarring. Hepatobiliary: Few  calcified granulomas of the liver. Cholelithiasis. Mild intrahepatic duct dilatation. Common bile duct is normal in caliber. Pancreas: Unremarkable. Spleen: Small calcified granulomas.  Otherwise unremarkable. Adrenals/Urinary Tract: Adrenals are unremarkable. Too small to characterize low-attenuation lesion of the right kidney. Air within the bladder is presumably due to instrumentation. Stomach/Bowel: Stomach is within normal limits. Bowel is normal in caliber. Sigmoid diverticulosis. Vascular/Lymphatic: Aortic atherosclerosis. No enlarged lymph nodes identified. Reproductive: Status post hysterectomy. No adnexal masses. Other: No ascites.  Abdominal wall is unremarkable. Musculoskeletal: Degenerative changes of the included spine. No acute osseous abnormality. IMPRESSION: Mild intrahepatic duct dilatation. Cholelithiasis. Small bilateral pleural effusions. Electronically Signed   By: Macy Mis M.D.   On: 05/17/2020 12:44   DG Chest Portable 1 View  Result Date: 05/17/2020 CLINICAL DATA:  Cough and history of prior COVID-19 positivity EXAM: PORTABLE CHEST 1 VIEW COMPARISON:  03/07/2020 FINDINGS: Cardiac shadow is within normal limits. Aortic calcifications are noted. The lungs are well aerated bilaterally. Increased bilateral airspace opacities are noted when compare with the prior exam consistent with the given clinical history of prior COVID-19 positivity. No sizable effusion is seen. No bony abnormality is noted. IMPRESSION: Increase in bilateral airspace opacities when compare with the prior exam consistent with the given clinical history. Electronically Signed   By: Inez Catalina M.D.   On: 05/17/2020 08:25      Discharge Exam: Vitals:   05/18/20 0347 05/18/20 0900  BP: (!) 160/79 (!) 156/79  Pulse: 90 91  Resp: 16 16  Temp: 98.2 F (36.8 C)   SpO2: 94% 95%   Vitals:   05/17/20 2030 05/18/20 0026 05/18/20 0347 05/18/20 0900  BP: (!) 155/75 139/71 (!) 160/79 (!) 156/79  Pulse: 93 91  90 91  Resp: 18 18 16 16   Temp: 98.5 F (36.9 C) 97.7 F (36.5 C) 98.2 F (36.8 C)   TempSrc: Oral Oral Oral   SpO2: 96% 90% 94% 95%  Weight:   114.1 kg   Height:        General: Pt is alert, awake, not in acute distress, morbid obesity Cardiovascular: RRR, S1/S2 +, no rubs, no gallops Respiratory: CTA bilaterally, no wheezing, no rhonchi, on 2 L nasal cannula oxygen Abdominal: Soft, NT, ND, bowel sounds + Extremities: no edema, no cyanosis    The results of significant diagnostics from this hospitalization (including imaging, microbiology, ancillary and laboratory) are listed below for reference.     Microbiology: Recent Results (from the past 240 hour(s))  Resp Panel by RT-PCR (Flu A&B, Covid) Nasopharyngeal Swab     Status: None   Collection Time: 05/17/20  8:08 AM   Specimen: Nasopharyngeal Swab; Nasopharyngeal(NP) swabs in vial transport medium  Result Value Ref Range Status   SARS Coronavirus 2 by RT PCR NEGATIVE NEGATIVE Final    Comment: (NOTE) SARS-CoV-2 target nucleic acids are NOT DETECTED.  The SARS-CoV-2 RNA is generally detectable in upper respiratory specimens during the acute phase of infection. The lowest concentration of SARS-CoV-2 viral copies this assay can detect is 138 copies/mL. A negative result does not preclude SARS-Cov-2 infection and should not be used as the sole basis for treatment or other patient management decisions. A negative result may occur with  improper specimen collection/handling, submission of specimen other than nasopharyngeal swab, presence of viral mutation(s) within the areas targeted by this assay, and inadequate number of viral copies(<138 copies/mL). A negative result must be combined with clinical observations, patient history, and epidemiological information. The expected result is Negative.  Fact Sheet for Patients:  EntrepreneurPulse.com.au  Fact Sheet for Healthcare Providers:   IncredibleEmployment.be  This test is no t yet approved or cleared by the [place] FDA and  has been authorized for detection and/or diagnosis of SARS-CoV-2 by FDA under an Emergency Use Authorization (EUA). This EUA will remain  in effect (meaning this test can be used) for the duration of the COVID-19 declaration under Section 564(b)(1) of the Act, 21 U.S.C.section 360bbb-3(b)(1), unless the authorization is terminated  or revoked sooner.       Influenza A by PCR NEGATIVE NEGATIVE Final   Influenza B by PCR NEGATIVE NEGATIVE Final    Comment: (NOTE) The Xpert Xpress SARS-CoV-2/FLU/RSV plus assay is intended as an aid in the diagnosis of influenza from Nasopharyngeal swab specimens and should not be used as a sole basis for treatment. Nasal washings and aspirates are unacceptable for Xpert Xpress SARS-CoV-2/FLU/RSV testing.  Fact Sheet for Patients: EntrepreneurPulse.com.au  Fact Sheet for Healthcare Providers: IncredibleEmployment.be  This test is not yet approved or cleared by the [place] FDA and has been authorized for detection and/or diagnosis of SARS-CoV-2 by FDA under an Emergency Use Authorization (EUA). This EUA will remain in effect (meaning this test can be used) for the duration of the COVID-19 declaration under Section 564(b)(1) of the Act, 21 U.S.C. section 360bbb-3(b)(1), unless the authorization is terminated or revoked.  Performed at [facility], [address] [place], [place] [address]   Blood culture (routine x 2)     Status: None (Preliminary result)   Collection Time: 05/17/20  9:36 AM   Specimen: BLOOD  Result Value Ref Range Status   Specimen Description BLOOD BLOOD LEFT ARM  Final   Special Requests   Final    BOTTLES DRAWN AEROBIC AND ANAEROBIC Blood Culture adequate volume   Culture   Final    NO GROWTH < 24 HOURS Performed at [facility], [address], [place], [place]  [address]    Report Status PENDING  Incomplete  Blood culture (routine x 2)     Status: None (Preliminary result)   Collection Time: 05/17/20  9:41 AM   Specimen: BLOOD  Result Value Ref Range Status   Specimen Description BLOOD RIGHT ANTECUBITAL  Final   Special Requests   Final    BOTTLES DRAWN AEROBIC AND ANAEROBIC Blood Culture results may not be optimal due to an inadequate volume of blood received in culture bottles   Culture   Final  NO GROWTH < 24 HOURS Performed at [facility], [address] [place], [place] [address]    Report Status PENDING  Incomplete     Labs: BNP (last 3 results) Recent Labs    12/21/19 1550 03/07/20 1816 05/17/20 0920  BNP 219.2* 1,301.4* 932.3*   Basic Metabolic Panel: Recent Labs  Lab 05/17/20 0918 05/17/20 1659 05/17/20 1811 05/17/20 2106 05/18/20 0453  NA 135  --   --   --  136  K 4.1  --   --   --  3.9  CL 96*  --   --   --  99  CO2 27  --   --   --  26  GLUCOSE 397*  --  468* 407* 123*  BUN 25*  --   --   --  28*  CREATININE 1.48* 1.41*  --   --  1.78*  CALCIUM 9.1  --   --   --  8.5*  MG  --   --   --   --  2.1   Liver Function Tests: Recent Labs  Lab 05/17/20 0918 05/18/20 0453  AST 17 11*  ALT 13 8  ALKPHOS 138* 97  BILITOT 0.7 0.6  PROT 7.4 5.5*  ALBUMIN 3.3* 2.5*   Recent Labs  Lab 05/17/20 0918  LIPASE 18   No results for input(s): AMMONIA in the last 168 hours. CBC: Recent Labs  Lab 05/17/20 0918 05/17/20 1659 05/18/20 0453  WBC 11.5* 9.6 7.9  NEUTROABS 10.0*  --   --   HGB 12.8 11.4* 10.4*  HCT 42.8 37.8 34.1*  MCV 87.3 86.1 85.9  PLT 237 277 276   Cardiac Enzymes: No results for input(s): CKTOTAL, CKMB, CKMBINDEX, TROPONINI in the last 168 hours. BNP: Invalid input(s): POCBNP CBG: Recent Labs  Lab 05/17/20 2142 05/18/20 0209 05/18/20 0602 05/18/20 0748 05/18/20 1030  GLUCAP 362* 120* 139* 126* 161*   D-Dimer No results for input(s): DDIMER in the last 72 hours. Hgb A1c Recent  Labs    05/17/20 1659  HGBA1C 8.7*   Lipid Profile No results for input(s): CHOL, HDL, LDLCALC, TRIG, CHOLHDL, LDLDIRECT in the last 72 hours. Thyroid function studies Recent Labs    05/17/20 1659  TSH 3.591   Anemia work up No results for input(s): VITAMINB12, FOLATE, FERRITIN, TIBC, IRON, RETICCTPCT in the last 72 hours. Urinalysis    Component Value Date/Time   COLORURINE YELLOW 05/17/2020 0808   APPEARANCEUR CLEAR 05/17/2020 0808   LABSPEC 1.015 05/17/2020 0808   PHURINE 7.0 05/17/2020 0808   GLUCOSEU >=500 (A) 05/17/2020 0808   HGBUR NEGATIVE 05/17/2020 0808   BILIRUBINUR NEGATIVE 05/17/2020 0808   KETONESUR 5 (A) 05/17/2020 0808   PROTEINUR >=300 (A) 05/17/2020 0808   UROBILINOGEN 0.2 04/01/2014 1659   NITRITE NEGATIVE 05/17/2020 0808   LEUKOCYTESUR SMALL (A) 05/17/2020 0808   Sepsis Labs Invalid input(s): PROCALCITONIN,  WBC,  LACTICIDVEN Microbiology Recent Results (from the past 240 hour(s))  Resp Panel by RT-PCR (Flu A&B, Covid) Nasopharyngeal Swab     Status: None   Collection Time: 05/17/20  8:08 AM   Specimen: Nasopharyngeal Swab; Nasopharyngeal(NP) swabs in vial transport medium  Result Value Ref Range Status   SARS Coronavirus 2 by RT PCR NEGATIVE NEGATIVE Final    Comment: (NOTE) SARS-CoV-2 target nucleic acids are NOT DETECTED.  The SARS-CoV-2 RNA is generally detectable in upper respiratory specimens during the acute phase of infection. The lowest concentration of SARS-CoV-2 viral copies this assay can  detect is 138 copies/mL. A negative result does not preclude SARS-Cov-2 infection and should not be used as the sole basis for treatment or other patient management decisions. A negative result may occur with  improper specimen collection/handling, submission of specimen other than nasopharyngeal swab, presence of viral mutation(s) within the areas targeted by this assay, and inadequate number of viral copies(<138 copies/mL). A negative result must  be combined with clinical observations, patient history, and epidemiological information. The expected result is Negative.  Fact Sheet for Patients:  EntrepreneurPulse.com.au  Fact Sheet for Healthcare Providers:  IncredibleEmployment.be  This test is no t yet approved or cleared by the [place] FDA and  has been authorized for detection and/or diagnosis of SARS-CoV-2 by FDA under an Emergency Use Authorization (EUA). This EUA will remain  in effect (meaning this test can be used) for the duration of the COVID-19 declaration under Section 564(b)(1) of the Act, 21 U.S.C.section 360bbb-3(b)(1), unless the authorization is terminated  or revoked sooner.       Influenza A by PCR NEGATIVE NEGATIVE Final   Influenza B by PCR NEGATIVE NEGATIVE Final    Comment: (NOTE) The Xpert Xpress SARS-CoV-2/FLU/RSV plus assay is intended as an aid in the diagnosis of influenza from Nasopharyngeal swab specimens and should not be used as a sole basis for treatment. Nasal washings and aspirates are unacceptable for Xpert Xpress SARS-CoV-2/FLU/RSV testing.  Fact Sheet for Patients: EntrepreneurPulse.com.au  Fact Sheet for Healthcare Providers: IncredibleEmployment.be  This test is not yet approved or cleared by the [place] FDA and has been authorized for detection and/or diagnosis of SARS-CoV-2 by FDA under an Emergency Use Authorization (EUA). This EUA will remain in effect (meaning this test can be used) for the duration of the COVID-19 declaration under Section 564(b)(1) of the Act, 21 U.S.C. section 360bbb-3(b)(1), unless the authorization is terminated or revoked.  Performed at [facility], [address], [place], [place] [address]   Blood culture (routine x 2)     Status: None (Preliminary result)   Collection Time: 05/17/20  9:36 AM   Specimen: BLOOD  Result Value Ref Range Status   Specimen  Description BLOOD BLOOD LEFT ARM  Final   Special Requests   Final    BOTTLES DRAWN AEROBIC AND ANAEROBIC Blood Culture adequate volume   Culture   Final    NO GROWTH < 24 HOURS Performed at [facility], [address] [place], [place] [address]    Report Status PENDING  Incomplete  Blood culture (routine x 2)     Status: None (Preliminary result)   Collection Time: 05/17/20  9:41 AM   Specimen: BLOOD  Result Value Ref Range Status   Specimen Description BLOOD RIGHT ANTECUBITAL  Final   Special Requests   Final    BOTTLES DRAWN AEROBIC AND ANAEROBIC Blood Culture results may not be optimal due to an inadequate volume of blood received in culture bottles   Culture   Final    NO GROWTH < 24 HOURS Performed at [facility], [address] [place], [place] [address]    Report Status PENDING  Incomplete     Time coordinating discharge: 35 minutes  SIGNED:   Rodena [name], DO [facility] 05/18/2020, 11:03 AM  If 7PM-7AM, please contact night-coverage [email or web address]

## 2020-05-19 LAB — URINE CULTURE: Culture: >=100000 — AB

## 2020-05-21 ENCOUNTER — Other Ambulatory Visit: Payer: Self-pay | Admitting: *Deleted

## 2020-05-21 MED ORDER — CEPHALEXIN 250 MG PO CAPS: 250.0000 mg | ORAL_CAPSULE | Freq: Four times a day (QID) | ORAL | 0 refills | Status: DC

## 2020-05-22 LAB — CULTURE, BLOOD (ROUTINE X 2)
Culture: NO GROWTH
Culture: NO GROWTH
Special Requests: ADEQUATE

## 2020-05-23 ENCOUNTER — Other Ambulatory Visit (HOSPITAL_COMMUNITY): Payer: Self-pay | Admitting: Adult Health

## 2020-05-23 ENCOUNTER — Telehealth: Payer: Self-pay | Admitting: *Deleted

## 2020-05-23 NOTE — Telephone Encounter (Signed)
Received call from patient.   Reports that she is having extreme nausea and can't keep anything down. States that she has increased SOB.   Advised to return to hospital for possible sepsis.   Verbalized understanding, but states that she can't go until spouse returns home.  Advised that she can contact EMS for transportation. Patient declined.

## 2020-05-23 NOTE — Telephone Encounter (Signed)
Agree with disposition - needs to go to ER for further evaluation.

## 2020-05-24 ENCOUNTER — Emergency Department (HOSPITAL_COMMUNITY): Payer: PPO

## 2020-05-24 ENCOUNTER — Inpatient Hospital Stay (HOSPITAL_COMMUNITY)
Admission: EM | Admit: 2020-05-24 | Discharge: 2020-05-28 | DRG: 368 | Disposition: A | Discharge status: Home / Self Care | Payer: PPO | Attending: Family Medicine | Admitting: Family Medicine

## 2020-05-24 ENCOUNTER — Encounter (HOSPITAL_COMMUNITY): Payer: Self-pay | Admitting: *Deleted

## 2020-05-24 DIAGNOSIS — Z8673 Personal history of transient ischemic attack (TIA), and cerebral infarction without residual deficits: Secondary | ICD-10-CM

## 2020-05-24 DIAGNOSIS — I2510 Atherosclerotic heart disease of native coronary artery without angina pectoris: Secondary | ICD-10-CM | POA: Diagnosis present

## 2020-05-24 DIAGNOSIS — Z955 Presence of coronary angioplasty implant and graft: Secondary | ICD-10-CM | POA: Diagnosis not present

## 2020-05-24 DIAGNOSIS — E1151 Type 2 diabetes mellitus with diabetic peripheral angiopathy without gangrene: Secondary | ICD-10-CM | POA: Diagnosis present

## 2020-05-24 DIAGNOSIS — K3189 Other diseases of stomach and duodenum: Secondary | ICD-10-CM | POA: Diagnosis not present

## 2020-05-24 DIAGNOSIS — I5033 Acute on chronic diastolic (congestive) heart failure: Secondary | ICD-10-CM | POA: Diagnosis present

## 2020-05-24 DIAGNOSIS — Z7902 Long term (current) use of antithrombotics/antiplatelets: Secondary | ICD-10-CM

## 2020-05-24 DIAGNOSIS — I130 Hypertensive heart and chronic kidney disease with heart failure and stage 1 through stage 4 chronic kidney disease, or unspecified chronic kidney disease: Secondary | ICD-10-CM | POA: Diagnosis not present

## 2020-05-24 DIAGNOSIS — Z79899 Other long term (current) drug therapy: Secondary | ICD-10-CM | POA: Diagnosis not present

## 2020-05-24 DIAGNOSIS — R1110 Vomiting, unspecified: Secondary | ICD-10-CM | POA: Diagnosis not present

## 2020-05-24 DIAGNOSIS — E66813 Obesity, class 3: Secondary | ICD-10-CM | POA: Diagnosis present

## 2020-05-24 DIAGNOSIS — R197 Diarrhea, unspecified: Secondary | ICD-10-CM | POA: Diagnosis not present

## 2020-05-24 DIAGNOSIS — N189 Chronic kidney disease, unspecified: Secondary | ICD-10-CM

## 2020-05-24 DIAGNOSIS — E039 Hypothyroidism, unspecified: Secondary | ICD-10-CM | POA: Diagnosis present

## 2020-05-24 DIAGNOSIS — N184 Chronic kidney disease, stage 4 (severe): Secondary | ICD-10-CM | POA: Diagnosis present

## 2020-05-24 DIAGNOSIS — T501X6A Underdosing of loop [high-ceiling] diuretics, initial encounter: Secondary | ICD-10-CM | POA: Diagnosis not present

## 2020-05-24 DIAGNOSIS — E1142 Type 2 diabetes mellitus with diabetic polyneuropathy: Secondary | ICD-10-CM | POA: Diagnosis not present

## 2020-05-24 DIAGNOSIS — R778 Other specified abnormalities of plasma proteins: Secondary | ICD-10-CM | POA: Diagnosis not present

## 2020-05-24 DIAGNOSIS — I252 Old myocardial infarction: Secondary | ICD-10-CM | POA: Diagnosis not present

## 2020-05-24 DIAGNOSIS — K2970 Gastritis, unspecified, without bleeding: Secondary | ICD-10-CM | POA: Diagnosis present

## 2020-05-24 DIAGNOSIS — Z20822 Contact with and (suspected) exposure to covid-19: Secondary | ICD-10-CM | POA: Diagnosis present

## 2020-05-24 DIAGNOSIS — N390 Urinary tract infection, site not specified: Secondary | ICD-10-CM | POA: Diagnosis present

## 2020-05-24 DIAGNOSIS — N186 End stage renal disease: Secondary | ICD-10-CM | POA: Diagnosis not present

## 2020-05-24 DIAGNOSIS — J189 Pneumonia, unspecified organism: Secondary | ICD-10-CM | POA: Diagnosis not present

## 2020-05-24 DIAGNOSIS — R059 Cough, unspecified: Secondary | ICD-10-CM | POA: Diagnosis not present

## 2020-05-24 DIAGNOSIS — E1122 Type 2 diabetes mellitus with diabetic chronic kidney disease: Secondary | ICD-10-CM | POA: Diagnosis not present

## 2020-05-24 DIAGNOSIS — K219 Gastro-esophageal reflux disease without esophagitis: Secondary | ICD-10-CM | POA: Diagnosis not present

## 2020-05-24 DIAGNOSIS — E1165 Type 2 diabetes mellitus with hyperglycemia: Secondary | ICD-10-CM | POA: Diagnosis not present

## 2020-05-24 DIAGNOSIS — R110 Nausea: Secondary | ICD-10-CM | POA: Diagnosis not present

## 2020-05-24 DIAGNOSIS — E440 Moderate protein-calorie malnutrition: Secondary | ICD-10-CM | POA: Diagnosis present

## 2020-05-24 DIAGNOSIS — I110 Hypertensive heart disease with heart failure: Secondary | ICD-10-CM | POA: Diagnosis not present

## 2020-05-24 DIAGNOSIS — Z794 Long term (current) use of insulin: Secondary | ICD-10-CM | POA: Diagnosis not present

## 2020-05-24 DIAGNOSIS — Z79890 Hormone replacement therapy (postmenopausal): Secondary | ICD-10-CM

## 2020-05-24 DIAGNOSIS — E8809 Other disorders of plasma-protein metabolism, not elsewhere classified: Secondary | ICD-10-CM

## 2020-05-24 DIAGNOSIS — N1832 Chronic kidney disease, stage 3b: Secondary | ICD-10-CM

## 2020-05-24 DIAGNOSIS — R112 Nausea with vomiting, unspecified: Secondary | ICD-10-CM

## 2020-05-24 DIAGNOSIS — R9431 Abnormal electrocardiogram [ECG] [EKG]: Secondary | ICD-10-CM | POA: Diagnosis present

## 2020-05-24 DIAGNOSIS — Z90710 Acquired absence of both cervix and uterus: Secondary | ICD-10-CM | POA: Diagnosis not present

## 2020-05-24 DIAGNOSIS — I509 Heart failure, unspecified: Secondary | ICD-10-CM | POA: Diagnosis not present

## 2020-05-24 DIAGNOSIS — I132 Hypertensive heart and chronic kidney disease with heart failure and with stage 5 chronic kidney disease, or end stage renal disease: Secondary | ICD-10-CM | POA: Diagnosis not present

## 2020-05-24 DIAGNOSIS — Z6841 Body Mass Index (BMI) 40.0 and over, adult: Secondary | ICD-10-CM

## 2020-05-24 DIAGNOSIS — E785 Hyperlipidemia, unspecified: Secondary | ICD-10-CM | POA: Diagnosis not present

## 2020-05-24 DIAGNOSIS — L89153 Pressure ulcer of sacral region, stage 3: Secondary | ICD-10-CM | POA: Diagnosis present

## 2020-05-24 DIAGNOSIS — B3781 Candidal esophagitis: Principal | ICD-10-CM | POA: Diagnosis present

## 2020-05-24 DIAGNOSIS — Z7982 Long term (current) use of aspirin: Secondary | ICD-10-CM

## 2020-05-24 DIAGNOSIS — I10 Essential (primary) hypertension: Secondary | ICD-10-CM | POA: Diagnosis not present

## 2020-05-24 DIAGNOSIS — R5381 Other malaise: Secondary | ICD-10-CM | POA: Diagnosis not present

## 2020-05-24 DIAGNOSIS — R7989 Other specified abnormal findings of blood chemistry: Secondary | ICD-10-CM

## 2020-05-24 DIAGNOSIS — R1111 Vomiting without nausea: Secondary | ICD-10-CM | POA: Diagnosis not present

## 2020-05-24 DIAGNOSIS — Z87891 Personal history of nicotine dependence: Secondary | ICD-10-CM

## 2020-05-24 DIAGNOSIS — R0602 Shortness of breath: Secondary | ICD-10-CM | POA: Diagnosis not present

## 2020-05-24 LAB — URINALYSIS, ROUTINE W REFLEX MICROSCOPIC
Bacteria, UA: NONE SEEN
Bilirubin Urine: NEGATIVE
Glucose, UA: >=500 mg/dL — AB
Hgb urine dipstick: NEGATIVE
Ketones, ur: 20 mg/dL — AB
Leukocytes,Ua: NEGATIVE
Nitrite: NEGATIVE
Protein, ur: >=300 mg/dL — AB
Specific Gravity, Urine: 1.02 (ref 1.005–1.030)
pH: 6 (ref 5.0–8.0)

## 2020-05-24 LAB — TROPONIN I (HIGH SENSITIVITY)
Troponin I (High Sensitivity): 101 ng/L (ref <18)
Troponin I (High Sensitivity): 84 ng/L — ABNORMAL HIGH (ref <18)

## 2020-05-24 LAB — COMPREHENSIVE METABOLIC PANEL
ALT: 11 U/L (ref 0–44)
AST: 11 U/L — ABNORMAL LOW (ref 15–41)
Albumin: 2.9 g/dL — ABNORMAL LOW (ref 3.5–5.0)
Alkaline Phosphatase: 104 U/L (ref 38–126)
Anion gap: 15 (ref 5–15)
BUN: 21 mg/dL (ref 8–23)
CO2: 22 mmol/L (ref 22–32)
Calcium: 9 mg/dL (ref 8.9–10.3)
Chloride: 99 mmol/L (ref 98–111)
Creatinine, Ser: 1.41 mg/dL — ABNORMAL HIGH (ref 0.44–1.00)
GFR, Estimated: 40 mL/min — ABNORMAL LOW (ref >60)
Glucose, Bld: 413 mg/dL — ABNORMAL HIGH (ref 70–99)
Potassium: 3.8 mmol/L (ref 3.5–5.1)
Sodium: 136 mmol/L (ref 135–145)
Total Bilirubin: 0.9 mg/dL (ref 0.3–1.2)
Total Protein: 6.5 g/dL (ref 6.5–8.1)

## 2020-05-24 LAB — CBC
HCT: 36.8 % (ref 36.0–46.0)
Hemoglobin: 11.4 g/dL — ABNORMAL LOW (ref 12.0–15.0)
MCH: 25.7 pg — ABNORMAL LOW (ref 26.0–34.0)
MCHC: 31 g/dL (ref 30.0–36.0)
MCV: 83.1 fL (ref 80.0–100.0)
Platelets: 391 10*3/uL (ref 150–400)
RBC: 4.43 MIL/uL (ref 3.87–5.11)
RDW: 15.3 % (ref 11.5–15.5)
WBC: 6.6 10*3/uL (ref 4.0–10.5)
nRBC: 0 % (ref 0.0–0.2)

## 2020-05-24 LAB — BRAIN NATRIURETIC PEPTIDE: B Natriuretic Peptide: 886 pg/mL — ABNORMAL HIGH (ref 0.0–100.0)

## 2020-05-24 LAB — LIPASE, BLOOD: Lipase: 16 U/L (ref 11–51)

## 2020-05-24 MED ORDER — FUROSEMIDE 10 MG/ML IJ SOLN
20.0000 mg | Freq: Once | INTRAMUSCULAR | Status: AC
  Administered 2020-05-24: 20 mg via INTRAVENOUS
  Filled 2020-05-24: qty 2

## 2020-05-24 MED ORDER — LORAZEPAM 2 MG/ML IJ SOLN
0.5000 mg | Freq: Once | INTRAMUSCULAR | Status: AC
  Administered 2020-05-24: 0.5 mg via INTRAVENOUS
  Filled 2020-05-24: qty 1

## 2020-05-24 MED ORDER — INSULIN ASPART 100 UNIT/ML ~~LOC~~ SOLN
5.0000 [IU] | Freq: Once | SUBCUTANEOUS | Status: AC
  Administered 2020-05-24: 5 [IU] via SUBCUTANEOUS
  Filled 2020-05-24: qty 1

## 2020-05-24 MED ORDER — POTASSIUM CHLORIDE CRYS ER 20 MEQ PO TBCR
40.0000 meq | EXTENDED_RELEASE_TABLET | Freq: Once | ORAL | Status: AC
  Administered 2020-05-24: 40 meq via ORAL
  Filled 2020-05-24: qty 2

## 2020-05-24 MED ORDER — HEPARIN SODIUM (PORCINE) 5000 UNIT/ML IJ SOLN
5000.0000 [IU] | Freq: Three times a day (TID) | INTRAMUSCULAR | Status: DC
  Administered 2020-05-25 – 2020-05-28 (×9): 5000 [IU] via SUBCUTANEOUS
  Filled 2020-05-24 (×9): qty 1

## 2020-05-24 MED ORDER — ONDANSETRON 4 MG PO TBDP
4.0000 mg | ORAL_TABLET | Freq: Once | ORAL | Status: AC | PRN
  Administered 2020-05-24: 4 mg via ORAL
  Filled 2020-05-24: qty 1

## 2020-05-24 MED ORDER — ASPIRIN 325 MG PO TABS
325.0000 mg | ORAL_TABLET | Freq: Once | ORAL | Status: AC
  Administered 2020-05-24: 325 mg via ORAL
  Filled 2020-05-24: qty 1

## 2020-05-24 NOTE — ED Notes (Signed)
Cardiology paged to [facility] @ [phone]

## 2020-05-24 NOTE — ED Triage Notes (Signed)
Nausea and vomiting, diarrhea for 2 weeks

## 2020-05-24 NOTE — ED Notes (Signed)
Pt daughter [name] called, requested that patient call her, phone provided to pt. [place] CB# [phone]

## 2020-05-24 NOTE — ED Provider Notes (Signed)
[facility] EMERGENCY DEPARTMENT Provider Note   CSN: [id number] Arrival date & time: 05/24/20  1209     History Chief Complaint  Patient presents with  . Nausea    Diarrhea     Susan [name] is a 69 y.o. female.  HPI   69 year old female with a history of MI, acute renal failure, CAD, CHF, depression, dyslipidemia, hypertension, hypothyroidism, diabetes, CVA, who presents to the emergency department today for evaluation of nausea and vomiting and diarrhea.  States this has been ongoing for the last 2 weeks.  She is actually recently admitted to the hospital for same and thought to have a viral gastroenteritis versus gastroparesis.  States symptoms have continued.  She does have some abdominal pain as well.  States she tested negative for Covid while she was admitted. She further complains of cough and some shortness of breath as well. The symptoms have been present for the last week. She admits that since she was discharged from the hospital she has not been taking her torsemide. She does not think that her bilateral lower extremity swelling is any worse than normal. She does report that she had some chest pain while in the waiting room. She took 2 nitroglycerin and it improved. She states the pain was sharp and located on the left side of her chest. It did radiate to her left upper extremity. It is since resolved. She denies any pleuritic pain at this time.  Past Medical History:  Diagnosis Date  . Acute MI ([place]) 1999; 2007  . Anemia    hx  . Anginal pain ([place])   . Anxiety   . ARF (acute renal failure) ([place]) 06/2017   [facility] Kidney Asso  . Arthritis    "generalized" (03/15/2014)  . CAD (coronary artery disease)    MI in 2000 - MI  2007 - treated bare metal stent (no nuclear since then as 9/11)  . Carotid artery disease ([place])   . CHF (congestive heart failure) ([place])   . Chronic diastolic heart failure (HCC)    a) ECHO (08/2013) EF 55-60% and RV function nl b) RHC (08/2013) RA  4, RV 30/5/7, PA 25/10 (16), PCWP 7, Fick CO/CI 6.3/2.7, PVR 1.5 WU, PA 61 and 66%  . Daily headache    "~ every other day; since I fell in June" (03/15/2014)  . Depression   . Dyslipidemia   . Dyspnea   . Exertional shortness of breath   . HTN (hypertension)   . Hypothyroidism   . Neuropathy   . Obesity   . Osteoarthritis   . Peripheral neuropathy   . PONV (postoperative nausea and vomiting)   . RBBB (right bundle branch block)    Old  . Stroke [facility])    mini strokes  . Syncope    likely due to low blood sugar  . Tachycardia    Sinus tachycardia  . Type II diabetes mellitus (HCC)    Type II  . Urinary incontinence   . Venous insufficiency     Patient Active Problem List   Diagnosis Date Noted  . Acute exacerbation of CHF (congestive heart failure) ([place]) 05/24/2020  . Intractable nausea and vomiting 05/17/2020  . Pressure injury of skin 05/17/2020  . Acute on chronic diastolic CHF (congestive heart failure) ([place]) 03/07/2020  . Obesity, Class III, BMI 40-49.9 (morbid obesity) ([place]) 03/07/2020  . Enteritis, enteropathogenic E. coli 02/10/2020  . Common bile duct (CBD) obstruction 05/28/2019  . Benign neoplasm of ascending colon   .  Benign neoplasm of transverse colon   . Benign neoplasm of descending colon   . Benign neoplasm of sigmoid colon   . Gastric polyps   . Hyperkalemia 03/11/2019  . Prolonged QT interval 03/11/2019  . Onychomycosis 06/21/2018  . Syncope 03/18/2018  . Osteomyelitis of second toe of right foot ([place])   . 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 ([place])   . PVD (peripheral vascular disease) ([place]) 10/26/2017  . Hypothyroid 07/27/2017  . PAH (pulmonary artery hypertension) ([place])   . Impaired ambulation 07/19/2017  . Leg cramps 02/27/2017  . Peripheral edema 01/12/2017  . Diabetic neuropathy ([place]) 11/12/2016  . 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 mellitus ([place]) 10/24/2015  . Anemia 10/03/2015  . Generalized anxiety disorder  10/03/2015  . Insomnia 10/03/2015  . Chest pain   . Diabetes mellitus type II, uncontrolled ([place]) 06/07/2015  . Chronic diastolic CHF (congestive heart failure) ([place]) 06/07/2015  . Non compliance with medical treatment 04/17/2014  . Rotator cuff tear 03/14/2014  . Class 3 obesity 09/23/2013  . Hypotension 12/25/2012  . Urinary incontinence   . MDD (major depressive disorder) 11/12/2010  . RBBB (right bundle branch block)   . CAD (coronary artery disease)   . Hyperlipemia 01/22/2009  . Essential hypertension 01/22/2009    Past Surgical History:  Procedure Laterality Date  . ABDOMINAL HYSTERECTOMY  1980's  . AMPUTATION Right 02/24/2018   Procedure: RIGHT FOOT GREAT TOE AND 2ND TOE AMPUTATION;  Surgeon: Newt Minion, MD;  Location: [place];  Service: Orthopedics;  Laterality: Right;  . AMPUTATION Right 04/30/2018   Procedure: RIGHT TRANSMETATARSAL AMPUTATION;  Surgeon: Newt Minion, MD;  Location: [place];  Service: Orthopedics;  Laterality: Right;  . CATARACT EXTRACTION, BILATERAL Bilateral ?2013  . COLONOSCOPY W/ POLYPECTOMY    . COLONOSCOPY WITH PROPOFOL N/A 03/13/2019   Procedure: COLONOSCOPY WITH PROPOFOL;  Surgeon: Jerene Bears, MD;  Location: [place];  Service: Gastroenterology;  Laterality: N/A;  . [place]; 2007   "1 + 1"  . ESOPHAGOGASTRODUODENOSCOPY (EGD) WITH PROPOFOL N/A 03/13/2019   Procedure: ESOPHAGOGASTRODUODENOSCOPY (EGD) WITH PROPOFOL;  Surgeon: Jerene Bears, MD;  Location: [facility] ENDOSCOPY;  Service: Gastroenterology;  Laterality: N/A;  . EYE SURGERY Bilateral    [place]  . HEMOSTASIS CLIP PLACEMENT  03/13/2019   Procedure: HEMOSTASIS CLIP PLACEMENT;  Surgeon: Jerene Bears, MD;  Location: [facility] ENDOSCOPY;  Service: Gastroenterology;;  . KNEE ARTHROSCOPY Left 10/25/2006  . POLYPECTOMY  03/13/2019   Procedure: POLYPECTOMY;  Surgeon: Jerene Bears, MD;  Location: [place];  Service: Gastroenterology;;  . RIGHT HEART CATH N/A  07/24/2017   Procedure: RIGHT HEART CATH;  Surgeon: Jolaine Artist, MD;  Location: [place] CV LAB;  Service: Cardiovascular;  Laterality: N/A;  . RIGHT HEART CATHETERIZATION N/A 09/22/2013   Procedure: RIGHT HEART CATH;  Surgeon: Jolaine Artist, MD;  Location: [facility] CATH LAB;  Service: Cardiovascular;  Laterality: N/A;  . SHOULDER ARTHROSCOPY WITH OPEN ROTATOR CUFF REPAIR Right 03/14/2014   Procedure: RIGHT SHOULDER ARTHROSCOPY WITH BICEPS RELEASE, OPEN SUBSCAPULA REPAIR, OPEN SUPRASPINATUS REPAIR.;  Surgeon: Meredith Pel, MD;  Location: [place];  Service: Orthopedics;  Laterality: Right;  . TOE AMPUTATION Right 02/24/2018   GREAT TOE AND 2ND TOE AMPUTATION  . TUBAL LIGATION  1970's     OB History   No obstetric history on file.     Family History  Problem Relation Age of Onset  .  Heart attack Mother 67    Social History   Tobacco Use  . Smoking status: Former Smoker    Packs/day: 3.00    Years: 32.00    Pack years: 96.00    Types: Cigarettes    Quit date: 10/24/1997    Years since quitting: 22.5  . Smokeless tobacco: Never Used  Vaping Use  . Vaping Use: Never used  Substance Use Topics  . Alcohol use: Not Currently    Comment: "might have 2-3 daiquiris in the summer"  . Drug use: No    Home Medications Prior to Admission medications   Medication Sig Start Date End Date Taking? Authorizing Provider  albuterol (VENTOLIN HFA) 108 (90 Base) MCG/ACT inhaler TAKE 2 PUFFS BY MOUTH EVERY 6 HOURS AS NEEDED FOR WHEEZE OR SHORTNESS OF BREATH Patient taking differently: Inhale 2 puffs into the lungs every 6 (six) hours as needed for wheezing or shortness of breath. 06/27/19   [place], [name], MD  allopurinol (ZYLOPRIM) 100 MG tablet TAKE 1 TABLET BY MOUTH TWICE A DAY Patient taking differently: Take 100 mg by mouth 2 (two) times daily. 08/02/19   Newt Minion, MD  Ascorbic Acid (VITAMIN C) 1000 MG tablet Take 1,000 mg by mouth daily.    [provider]  aspirin  EC 81 MG tablet Take 81 mg by mouth every morning.     [provider]  carvedilol (COREG) 12.5 MG tablet Take 1 tablet (12.5 mg total) by mouth 2 (two) times daily with a meal. 02/13/20   [place], [name], MD  cephALEXin (KEFLEX) 250 MG capsule Take 1 capsule (250 mg total) by mouth 4 (four) times daily for 5 days. 05/21/20 05/26/20  Alycia Rossetti, MD  cholecalciferol (VITAMIN D) 1000 units tablet Take 1,000 Units by mouth daily with supper.     [provider]  clopidogrel (PLAVIX) 75 MG tablet Take 1 tablet (75 mg total) by mouth daily with breakfast. 05/23/20   Clegg, Amy D, NP  ferrous sulfate 325 (65 FE) MG tablet Take 1 tablet (325 mg total) by mouth 2 (two) times daily with a meal. Reported on 08/21/2015 06/07/18   Alycia Rossetti, MD  guaiFENesin-dextromethorphan [facility] DM) 100-10 MG/5ML syrup Take 10 mLs by mouth every 4 (four) hours as needed for cough. 03/13/20   [place] Donnamarie Poag, DO  HYDROcodone-acetaminophen (NORCO) 5-325 MG tablet Take 1 tablet by mouth every 8 (eight) hours as needed for moderate pain. Patient not taking: No sig reported 04/04/20   Alycia Rossetti, MD  Insulin Disposable Pump (OMNIPOD DASH 5 PACK PODS) MISC Inject into the skin See admin instructions. Use continuously with Novolin R - change every 72 hours. 11/18/18   [provider]  insulin NPH Human (HUMULIN N,NOVOLIN N) 100 UNIT/ML injection Inject 10-15 Units into the skin See admin instructions. Inject 15 units subcutaneously in the morning if CBG >300; inject 10 units 200-300    [provider]  insulin regular (NOVOLIN R) 100 units/mL injection Inject into the skin See admin instructions. Manually add bolus to continuous dose via OmniPod 3 times daily per sliding scale (CBG 80-150 7 units, 151-200 9 units, 201-250 12 units, 251-300 14 units, 301- 400 17 units)    [provider]  levothyroxine (SYNTHROID, LEVOTHROID) 50 MCG tablet Take 1 tablet (50 mcg total) by  mouth daily before breakfast. 11/07/16   Dena Billet B, PA-C  magnesium oxide (MAG-OX) 400 MG tablet TAKE 1 TABLET BY MOUTH EVERY DAY  Patient taking differently: Take 400 mg by mouth daily. 09/13/19   [place], [name], MD  metolazone (ZAROXOLYN) 2.5 MG tablet Take 2.5 mg by mouth See admin instructions. Take 1 tablet by mouth on Monday and Fridays    [provider]  nitroGLYCERIN (NITROSTAT) 0.4 MG SL tablet Place 1 tablet (0.4 mg total) under the tongue every 5 (five) minutes as needed for chest pain. Patient taking differently: Place 0.4 mg under the tongue every 5 (five) minutes as needed for chest pain (max 3 doses). 04/28/19   Clegg, Amy D, NP  ondansetron (ZOFRAN-ODT) 8 MG disintegrating tablet Take 1 tablet (8 mg total) by mouth 3 (three) times daily. Patient taking differently: Take 8 mg by mouth 3 (three) times daily as needed for nausea or vomiting. 02/27/20   [place], [name], MD  pantoprazole (PROTONIX) 40 MG tablet TAKE 1 TABLET BY MOUTH EVERY DAY Patient taking differently: Take 40 mg by mouth daily. 01/04/20   [place], [name], MD  pregabalin (LYRICA) 150 MG capsule Take 1 capsule (150 mg total) by mouth 2 (two) times daily. 11/12/16   Alycia Rossetti, MD  torsemide (DEMADEX) 20 MG tablet Take 2 tablets (40 mg total) by mouth 2 (two) times daily. Patient taking differently: Take 60 mg by mouth 2 (two) times daily. 02/10/20   [place], Marlowe Aschoff, MD  traZODone (DESYREL) 100 MG tablet TAKE 1 AND 1/2 TABLETS BY MOUTH AT BEDTIME AS NEEDED FOR SLEEP. Patient taking differently: Take 100 mg by mouth at bedtime. 01/26/20   Alycia Rossetti, MD  ursodiol (ACTIGALL) 500 MG tablet Take 500 mg by mouth 3 (three) times daily. 09/09/19   [provider]  vitamin E 1000 UNIT capsule Take 1,000 Units by mouth daily with supper.     [provider]    Allergies    Codeine  Review of Systems   Review of Systems  Constitutional: Negative for chills and fever.  HENT: Negative  for ear pain and sore throat.   Eyes: Negative for visual disturbance.  Respiratory: Positive for cough and shortness of breath.   Cardiovascular: Negative for chest pain.  Gastrointestinal: Negative for abdominal pain, constipation, diarrhea, nausea and vomiting.  Genitourinary: Negative for dysuria and hematuria.  Musculoskeletal: Negative for back pain.  Skin: Negative for color change and rash.  Neurological: Negative for seizures and syncope.  All other systems reviewed and are negative.   Physical Exam Updated Vital Signs BP (!) 171/94 (BP Location: Left Arm)   Pulse (!) 108   Temp 98.3 F (36.8 C) (Oral)   Resp (!) 24   SpO2 93%   Physical Exam Vitals and nursing note reviewed.  Constitutional:      General: She is not in acute distress.    Appearance: She is well-developed and well-nourished.  HENT:     Head: Normocephalic and atraumatic.  Eyes:     Conjunctiva/sclera: Conjunctivae normal.  Cardiovascular:     Rate and Rhythm: Normal rate and regular rhythm.     Heart sounds: No murmur heard.   Pulmonary:     Effort: Pulmonary effort is normal. No respiratory distress.     Breath sounds: Decreased breath sounds and rales present.  Abdominal:     General: Bowel sounds are normal.     Palpations: Abdomen is soft.     Tenderness: There is abdominal tenderness (mild epigastric ttp). There is no guarding or rebound.  Musculoskeletal:     Cervical back: Neck supple.  Right lower leg: Edema present.     Left lower leg: Edema present.  Skin:    General: Skin is warm and dry.  Neurological:     Mental Status: She is alert.  Psychiatric:        Mood and Affect: Mood and affect normal.     ED Results / Procedures / Treatments   Labs (all labs ordered are listed, but only abnormal results are displayed) Labs Reviewed  COMPREHENSIVE METABOLIC PANEL - Abnormal; Notable for the following components:      Result Value   Glucose, Bld 413 (*)    Creatinine, Ser  1.41 (*)    Albumin 2.9 (*)    AST 11 (*)    GFR, Estimated 40 (*)    All other components within normal limits  CBC - Abnormal; Notable for the following components:   Hemoglobin 11.4 (*)    MCH 25.7 (*)    All other components within normal limits  URINALYSIS, ROUTINE W REFLEX MICROSCOPIC - Abnormal; Notable for the following components:   Glucose, UA >=500 (*)    Ketones, ur 20 (*)    Protein, ur >=300 (*)    All other components within normal limits  BRAIN NATRIURETIC PEPTIDE - Abnormal; Notable for the following components:   B Natriuretic Peptide 886.0 (*)    All other components within normal limits  TROPONIN I (HIGH SENSITIVITY) - Abnormal; Notable for the following components:   Troponin I (High Sensitivity) 101 (*)    All other components within normal limits  TROPONIN I (HIGH SENSITIVITY) - Abnormal; Notable for the following components:   Troponin I (High Sensitivity) 84 (*)    All other components within normal limits  LIPASE, BLOOD  CBC  CREATININE, SERUM  COMPREHENSIVE METABOLIC PANEL  CBC  PROTIME-INR  APTT  MAGNESIUM  PHOSPHORUS    EKG EKG Interpretation  Date/Time:  Thursday May 24 2020 17:58:55 EST Ventricular Rate:  99 PR Interval:    QRS Duration: 127 QT Interval:  415 QTC Calculation: 533 R Axis:   -76 Text Interpretation: Sinus rhythm Right bundle branch block Inferior infarct, old Abnormal lateral Q waves Anterior infarct, old new rbbb Confirmed by Varney Biles [phone]) on 05/24/2020 6:34:39 PM   Radiology DG Chest Portable 1 View  Result Date: 05/24/2020 CLINICAL DATA:  Cough, short of breath, nausea and vomiting, diarrhea for 2 weeks EXAM: PORTABLE CHEST 1 VIEW COMPARISON:  05/17/2020 FINDINGS: Single frontal view of the chest demonstrates an unremarkable cardiac silhouette. Interstitial and ground-glass opacities throughout the lungs are again noted, with no significant change since prior study. No effusion or pneumothorax. No acute  bony abnormalities. IMPRESSION: 1. Stable bilateral interstitial and ground-glass opacities, which could reflect edema or multifocal pneumonia. Electronically Signed   By: Randa Ngo M.D.   On: 05/24/2020 18:33    Procedures Procedures (including critical care time)  Medications Ordered in ED Medications  heparin injection 5,000 Units (has no administration in time range)  ondansetron (ZOFRAN-ODT) disintegrating tablet 4 mg (4 mg Oral Given 05/24/20 1359)  LORazepam (ATIVAN) injection 0.5 mg (0.5 mg Intravenous Given 05/24/20 1946)  aspirin tablet 325 mg (325 mg Oral Given 05/24/20 2127)  furosemide (LASIX) injection 20 mg (20 mg Intravenous Given 05/24/20 2128)  potassium chloride SA (KLOR-CON) CR tablet 40 mEq (40 mEq Oral Given 05/24/20 2127)  insulin aspart (novoLOG) injection 5 Units (5 Units Subcutaneous Given 05/24/20 2140)    ED Course  I have reviewed the triage vital signs  and the nursing notes.  Pertinent labs & imaging results that were available during my care of the patient were reviewed by me and considered in my medical decision making (see chart for details).    MDM Rules/Calculators/A&P                          69 year old female presenting emergency department today for evaluation of nausea vomiting and some epigastric abdominal pain. Also has cough and shortness of breath over the last week. Noncompliance with torsemide at home.  Reviewed/interpreted labs CBC is without leukocytosis, anemia present but mild baseline CMP with elevated blood glucose, normal bicarb and no elevated anion gap to suggest DKA to. LFTs are normal and creatinine is elevated but at baseline for the patient Lipase is normal UA shows ketones, proteinuria, but does not show any evidence of infection BNP is elevated - suspect CHF exacerbation due to med noncompliance Troponin elevated, likely demand  EKG Sinus rhythm Right bundle branch block Inferior infarct, old Abnormal lateral Q  waves Anterior infarct,  rbbb   Chest x-ray - 1. Stable bilateral interstitial and ground-glass opacities, which could reflect edema or multifocal pneumonia.  Patient with CHF exacerbation due to medication noncompliance.  Also with persistent nausea and vomiting.  Recently admitted for this last week.  Has QT with prolongation therefore antiemetic choices limited.  We will plan for admission for diuresis and continued treatment of her nausea and vomiting.  8:34 PM CONSULT with Dr. Radford Pax with cardiology.  She reviewed the patient's chart.  She feels that the patient's elevated troponin is less likely related to ACS and more likely related to demand ischemia in setting of acute heart failure exacerbation.  She would not start the patient on heparin at this time.  9:41 PM CONSULT with Dr. Josephine Cables who accepts patient for admission.   Final Clinical Impression(s) / ED Diagnoses Final diagnoses:  Acute on chronic heart failure, unspecified heart failure type ([place])  Intractable vomiting with nausea, unspecified vomiting type    Rx / DC Orders ED Discharge Orders    None       [place] 05/24/20 2221    Varney Biles, MD 05/25/20 1521

## 2020-05-24 NOTE — ED Notes (Signed)
Date and time results received: 05/24/20 1947   Test: troponin Critical Value: 101  Name of Provider Notified: Dr. Varney Biles at [phone] No new orders at this time

## 2020-05-25 ENCOUNTER — Encounter (HOSPITAL_COMMUNITY): Payer: Self-pay | Admitting: Internal Medicine

## 2020-05-25 ENCOUNTER — Other Ambulatory Visit: Payer: Self-pay

## 2020-05-25 DIAGNOSIS — N189 Chronic kidney disease, unspecified: Secondary | ICD-10-CM

## 2020-05-25 DIAGNOSIS — K219 Gastro-esophageal reflux disease without esophagitis: Secondary | ICD-10-CM

## 2020-05-25 DIAGNOSIS — E8809 Other disorders of plasma-protein metabolism, not elsewhere classified: Secondary | ICD-10-CM

## 2020-05-25 DIAGNOSIS — R197 Diarrhea, unspecified: Secondary | ICD-10-CM | POA: Diagnosis not present

## 2020-05-25 DIAGNOSIS — R112 Nausea with vomiting, unspecified: Secondary | ICD-10-CM | POA: Diagnosis not present

## 2020-05-25 LAB — C DIFFICILE QUICK SCREEN W PCR REFLEX
C Diff antigen: NEGATIVE
C Diff interpretation: NOT DETECTED
C Diff toxin: NEGATIVE

## 2020-05-25 LAB — CBC
HCT: 35.4 % — ABNORMAL LOW (ref 36.0–46.0)
Hemoglobin: 10.6 g/dL — ABNORMAL LOW (ref 12.0–15.0)
MCH: 25.9 pg — ABNORMAL LOW (ref 26.0–34.0)
MCHC: 29.9 g/dL — ABNORMAL LOW (ref 30.0–36.0)
MCV: 86.3 fL (ref 80.0–100.0)
Platelets: 334 10*3/uL (ref 150–400)
RBC: 4.1 MIL/uL (ref 3.87–5.11)
RDW: 15.6 % — ABNORMAL HIGH (ref 11.5–15.5)
WBC: 6.5 10*3/uL (ref 4.0–10.5)
nRBC: 0 % (ref 0.0–0.2)

## 2020-05-25 LAB — GLUCOSE, CAPILLARY
Glucose-Capillary: 253 mg/dL — ABNORMAL HIGH (ref 70–99)
Glucose-Capillary: 193 mg/dL — ABNORMAL HIGH (ref 70–99)

## 2020-05-25 LAB — COMPREHENSIVE METABOLIC PANEL
ALT: 11 U/L (ref 0–44)
AST: 10 U/L — ABNORMAL LOW (ref 15–41)
Albumin: 2.7 g/dL — ABNORMAL LOW (ref 3.5–5.0)
Alkaline Phosphatase: 96 U/L (ref 38–126)
Anion gap: 15 (ref 5–15)
BUN: 24 mg/dL — ABNORMAL HIGH (ref 8–23)
CO2: 21 mmol/L — ABNORMAL LOW (ref 22–32)
Calcium: 8.6 mg/dL — ABNORMAL LOW (ref 8.9–10.3)
Chloride: 99 mmol/L (ref 98–111)
Creatinine, Ser: 1.58 mg/dL — ABNORMAL HIGH (ref 0.44–1.00)
GFR, Estimated: 35 mL/min — ABNORMAL LOW (ref >60)
Glucose, Bld: 487 mg/dL — ABNORMAL HIGH (ref 70–99)
Potassium: 4.3 mmol/L (ref 3.5–5.1)
Sodium: 135 mmol/L (ref 135–145)
Total Bilirubin: 1 mg/dL (ref 0.3–1.2)
Total Protein: 6 g/dL — ABNORMAL LOW (ref 6.5–8.1)

## 2020-05-25 LAB — RESP PANEL BY RT-PCR (FLU A&B, COVID) ARPGX2
Influenza A by PCR: NEGATIVE
Influenza B by PCR: NEGATIVE
SARS Coronavirus 2 by RT PCR: NEGATIVE

## 2020-05-25 LAB — PHOSPHORUS: Phosphorus: 4.2 mg/dL (ref 2.5–4.6)

## 2020-05-25 LAB — CBG MONITORING, ED
Glucose-Capillary: 502 mg/dL (ref 70–99)
Glucose-Capillary: 448 mg/dL — ABNORMAL HIGH (ref 70–99)
Glucose-Capillary: 456 mg/dL — ABNORMAL HIGH (ref 70–99)

## 2020-05-25 LAB — PROTIME-INR
INR: 1 (ref 0.8–1.2)
Prothrombin Time: 13 seconds (ref 11.4–15.2)

## 2020-05-25 LAB — APTT: aPTT: 29 seconds (ref 24–36)

## 2020-05-25 LAB — MAGNESIUM: Magnesium: 1.8 mg/dL (ref 1.7–2.4)

## 2020-05-25 MED ORDER — METOLAZONE 5 MG PO TABS
2.5000 mg | ORAL_TABLET | ORAL | Status: DC
  Administered 2020-05-28: 2.5 mg via ORAL

## 2020-05-25 MED ORDER — URSODIOL 300 MG PO CAPS
600.0000 mg | ORAL_CAPSULE | Freq: Three times a day (TID) | ORAL | Status: DC
  Administered 2020-05-26 (×3): 600 mg via ORAL
  Filled 2020-05-25 (×8): qty 2

## 2020-05-25 MED ORDER — VITAMIN E 45 MG (100 UNIT) PO CAPS
1000.0000 [IU] | ORAL_CAPSULE | Freq: Every day | ORAL | Status: DC
  Filled 2020-05-25 (×4): qty 2

## 2020-05-25 MED ORDER — ALBUTEROL SULFATE HFA 108 (90 BASE) MCG/ACT IN AERS: 2.0000 | INHALATION_SPRAY | Freq: Four times a day (QID) | RESPIRATORY_TRACT | Status: DC | PRN

## 2020-05-25 MED ORDER — INSULIN ASPART 100 UNIT/ML ~~LOC~~ SOLN
0.0000 [IU] | Freq: Three times a day (TID) | SUBCUTANEOUS | Status: DC
  Administered 2020-05-25 (×2): 20 [IU] via SUBCUTANEOUS
  Administered 2020-05-25: 11 [IU] via SUBCUTANEOUS
  Administered 2020-05-26: 15 [IU] via SUBCUTANEOUS
  Administered 2020-05-26: 7 [IU] via SUBCUTANEOUS
  Administered 2020-05-26: 11 [IU] via SUBCUTANEOUS
  Administered 2020-05-27: 15 [IU] via SUBCUTANEOUS
  Administered 2020-05-27: 11 [IU] via SUBCUTANEOUS
  Administered 2020-05-27: 15 [IU] via SUBCUTANEOUS
  Administered 2020-05-28: 7 [IU] via SUBCUTANEOUS
  Administered 2020-05-28: 11 [IU] via SUBCUTANEOUS
  Filled 2020-05-25 (×2): qty 1

## 2020-05-25 MED ORDER — INSULIN ASPART 100 UNIT/ML ~~LOC~~ SOLN
20.0000 [IU] | Freq: Once | SUBCUTANEOUS | Status: AC
  Administered 2020-05-25: 20 [IU] via SUBCUTANEOUS

## 2020-05-25 MED ORDER — LEVOTHYROXINE SODIUM 50 MCG PO TABS
50.0000 ug | ORAL_TABLET | Freq: Every day | ORAL | Status: DC
  Administered 2020-05-26 – 2020-05-28 (×2): 50 ug via ORAL
  Filled 2020-05-25 (×2): qty 1

## 2020-05-25 MED ORDER — TRAZODONE HCL 50 MG PO TABS
100.0000 mg | ORAL_TABLET | Freq: Every day | ORAL | Status: DC
  Administered 2020-05-25 – 2020-05-27 (×3): 100 mg via ORAL
  Filled 2020-05-25 (×3): qty 2

## 2020-05-25 MED ORDER — NITROGLYCERIN 0.4 MG SL SUBL
0.4000 mg | SUBLINGUAL_TABLET | SUBLINGUAL | Status: DC | PRN
  Administered 2020-05-25: 0.4 mg via SUBLINGUAL
  Filled 2020-05-25: qty 1

## 2020-05-25 MED ORDER — PREGABALIN 75 MG PO CAPS
150.0000 mg | ORAL_CAPSULE | Freq: Two times a day (BID) | ORAL | Status: DC
  Administered 2020-05-25 – 2020-05-28 (×6): 150 mg via ORAL
  Filled 2020-05-25 (×6): qty 2

## 2020-05-25 MED ORDER — GLUCERNA SHAKE PO LIQD
237.0000 mL | Freq: Three times a day (TID) | ORAL | Status: DC
  Administered 2020-05-25 – 2020-05-27 (×3): 237 mL via ORAL
  Filled 2020-05-25 (×11): qty 237

## 2020-05-25 MED ORDER — FERROUS SULFATE 325 (65 FE) MG PO TABS
325.0000 mg | ORAL_TABLET | Freq: Two times a day (BID) | ORAL | Status: DC
  Administered 2020-05-26 – 2020-05-28 (×5): 325 mg via ORAL
  Filled 2020-05-25 (×5): qty 1

## 2020-05-25 MED ORDER — ALLOPURINOL 100 MG PO TABS
100.0000 mg | ORAL_TABLET | Freq: Two times a day (BID) | ORAL | Status: DC
  Administered 2020-05-25 – 2020-05-28 (×6): 100 mg via ORAL
  Filled 2020-05-25 (×6): qty 1

## 2020-05-25 MED ORDER — CARVEDILOL 12.5 MG PO TABS
12.5000 mg | ORAL_TABLET | Freq: Two times a day (BID) | ORAL | Status: DC
  Administered 2020-05-26 – 2020-05-28 (×5): 12.5 mg via ORAL
  Filled 2020-05-25 (×5): qty 1

## 2020-05-25 MED ORDER — SODIUM CHLORIDE 0.9 % IV SOLN
1.0000 g | INTRAVENOUS | Status: AC
  Administered 2020-05-25 – 2020-05-27 (×3): 1 g via INTRAVENOUS
  Filled 2020-05-25 (×3): qty 10

## 2020-05-25 MED ORDER — ASPIRIN EC 81 MG PO TBEC
81.0000 mg | DELAYED_RELEASE_TABLET | Freq: Every morning | ORAL | Status: DC
  Administered 2020-05-26 – 2020-05-28 (×3): 81 mg via ORAL
  Filled 2020-05-25 (×3): qty 1

## 2020-05-25 MED ORDER — MAGNESIUM OXIDE 400 MG PO TABS
400.0000 mg | ORAL_TABLET | Freq: Every day | ORAL | Status: DC
  Administered 2020-05-26 – 2020-05-28 (×3): 400 mg via ORAL
  Filled 2020-05-25 (×8): qty 1

## 2020-05-25 MED ORDER — PANTOPRAZOLE SODIUM 40 MG PO TBEC
40.0000 mg | DELAYED_RELEASE_TABLET | Freq: Every day | ORAL | Status: DC
  Administered 2020-05-26 – 2020-05-28 (×3): 40 mg via ORAL
  Filled 2020-05-25 (×3): qty 1

## 2020-05-25 MED ORDER — FUROSEMIDE 10 MG/ML IJ SOLN
40.0000 mg | Freq: Two times a day (BID) | INTRAMUSCULAR | Status: DC
  Administered 2020-05-25: 40 mg via INTRAVENOUS
  Filled 2020-05-25: qty 4

## 2020-05-25 MED ORDER — VITAMIN D 25 MCG (1000 UNIT) PO TABS
1000.0000 [IU] | ORAL_TABLET | Freq: Every day | ORAL | Status: DC
  Administered 2020-05-26 – 2020-05-27 (×2): 1000 [IU] via ORAL
  Filled 2020-05-25 (×2): qty 1

## 2020-05-25 MED ORDER — INSULIN GLARGINE 100 UNIT/ML ~~LOC~~ SOLN
20.0000 [IU] | Freq: Every day | SUBCUTANEOUS | Status: DC
  Administered 2020-05-25 – 2020-05-28 (×4): 20 [IU] via SUBCUTANEOUS
  Filled 2020-05-25 (×7): qty 0.2

## 2020-05-25 MED ORDER — INSULIN ASPART 100 UNIT/ML ~~LOC~~ SOLN
0.0000 [IU] | Freq: Every day | SUBCUTANEOUS | Status: DC
  Administered 2020-05-26: 2 [IU] via SUBCUTANEOUS

## 2020-05-25 MED ORDER — MAGNESIUM SULFATE 2 GM/50ML IV SOLN
2.0000 g | Freq: Once | INTRAVENOUS | Status: AC
  Administered 2020-05-25: 2 g via INTRAVENOUS
  Filled 2020-05-25: qty 50

## 2020-05-25 MED ORDER — TORSEMIDE 20 MG PO TABS
60.0000 mg | ORAL_TABLET | Freq: Two times a day (BID) | ORAL | Status: DC
  Administered 2020-05-26 – 2020-05-28 (×5): 60 mg via ORAL
  Filled 2020-05-25 (×5): qty 3

## 2020-05-25 MED ORDER — CLOPIDOGREL BISULFATE 75 MG PO TABS
75.0000 mg | ORAL_TABLET | Freq: Every day | ORAL | Status: DC
  Administered 2020-05-26 – 2020-05-28 (×3): 75 mg via ORAL
  Filled 2020-05-25 (×3): qty 1

## 2020-05-25 MED ORDER — ASCORBIC ACID 500 MG PO TABS
1000.0000 mg | ORAL_TABLET | Freq: Every day | ORAL | Status: DC
  Administered 2020-05-26 – 2020-05-28 (×3): 1000 mg via ORAL
  Filled 2020-05-25 (×3): qty 2

## 2020-05-25 NOTE — Progress Notes (Signed)
PROGRESS NOTE    Susan [name]  ASN:[id number] DOB: 1951/03/04 DOA: 05/24/2020 PCP: Alycia Rossetti, MD    Brief Narrative:  Patient is 69 year old female with history of chronic diastolic congestive heart failure, hypertension, type 2 diabetes on insulin pump, stage IV chronic kidney disease, peripheral vascular disease and chronic neuropathy presented to the ER with ongoing nausea vomiting and diarrhea for almost 4 weeks now.  Patient was admitted to hospital with same symptoms on 12/23-12/24.  Suspected with viral gastroenteritis and discharged home.  Patient had ongoing nausea and unable to tolerate oral medications.  Also has intermittent cough and shortness of breath as well as bilateral leg swelling so came to the hospital. In the emergency room, initially tachycardic and tachypneic.  Blood pressures elevated.  Renal functions within normal range.  Hyperglycemic.  Troponin 10-84.  BNP 886.  Chest x-ray with bilateral interstitial and groundglass opacities.  She was given dose of aspirin.  IV Lasix 20 mg x 1.  Insulin.  Admitted with ongoing nausea vomiting and intolerance to diet.  Assessment & Plan:   Principal Problem:   Nausea vomiting and diarrhea Active Problems:   Essential hypertension   Hyperglycemia due to diabetes mellitus (HCC)   Elevated troponin   Prolonged QT interval   Obesity, Class III, BMI 40-49.9 (morbid obesity) (HCC)   Acute exacerbation of CHF (congestive heart failure) (HCC)   Chronic kidney disease (CKD)   Hypoalbuminemia   GERD (gastroesophageal reflux disease)  Nausea, vomiting and diarrhea: Chronic and recurrent symptoms.  Primary cause unknown. CT scan abdomen pelvis with contrast on 12/23 with no evidence of intra-abdominal process. Patient tolerating clears, advance to full liquid diet and will slowly advance.  Use nausea medication as needed. No more loose stool since last 24 hours, if she has any loose stool, will send for C. difficile and GI  pathogen panel.  If persistent, will discuss with gastroenterology.  Presumed acute exacerbation of diastolic heart failure: Recent echocardiogram with normal ejection fraction.  BNP 886.  Given IV Lasix x1.  Currently euvolemic.  Will resume her home doses of torsemide.  Type 2 diabetes with hyperglycemia: Uncontrolled. Patient on insulin pump at home.  She did not receive her insulin overnight. Resume long-acting insulin and high-dose sliding scale insulin.  We will continue to monitor.  GERD: On PPI.  Acute UTI, present on admission.  Diagnosed with pansensitive E. coli on 12/23.  He still symptomatic.  Only able to take 2 doses of Keflex.  Will treat with Rocephin while in the hospital.   DVT prophylaxis: heparin injection 5,000 Units Start: 05/24/20 2230 SCDs Start: 05/24/20 2218   Code Status: Full code Family Communication: None.  Patient will communicate. Disposition Plan: Status is: Inpatient  Remains inpatient appropriate because:Inpatient level of care appropriate due to severity of illness   Dispo: The patient is from: Home              Anticipated d/c is to: Home              Anticipated d/c date is: 2 days              Patient currently is not medically stable to d/c.         Consultants:   None  Procedures:   None  Antimicrobials:   Rocephin, 12/30---   Subjective: Patient seen and examined.  Still has some nausea, however she is feeling somehow better and wants to try some liquid diet. Denies any abdominal  pain or cramping.  No bowel movement since admission to the hospital for last 18 hours.  Afebrile.  Denies any chest pain or palpitations.  Objective: Vitals:   05/25/20 1100 05/25/20 1130 05/25/20 1200 05/25/20 1400  BP: (!) 148/70 139/66 (!) 152/84 (!) 125/51  Pulse: 82 75 81   Resp: 17 16 18    Temp:      TempSrc:      SpO2: 99% 99% 99%     Intake/Output Summary (Last 24 hours) at 05/25/2020 1459 Last data filed at 05/25/2020  1104 Gross per 24 hour  Intake 144.54 ml  Output 2000 ml  Net -1855.46 ml   There were no vitals filed for this visit.  Examination:  General exam: Appears calm and comfortable  Slightly anxious. Respiratory system: Clear to auscultation. Respiratory effort normal.  No added sounds. Cardiovascular system: S1 & S2 heard, RRR. No pedal edema. Gastrointestinal system: Abdomen is nondistended, soft and nontender. No organomegaly or masses felt. Normal bowel sounds heard. Central nervous system: Alert and oriented. No focal neurological deficits. Extremities: Symmetric 5 x 5 power. Skin: No rashes, lesions or ulcers Psychiatry: Judgement and insight appear normal. Mood & affect anxious.    Data Reviewed: I have personally reviewed following labs and imaging studies  CBC: Recent Labs  Lab 05/24/20 1630 05/25/20 0501  WBC 6.6 6.5  HGB 11.4* 10.6*  HCT 36.8 35.4*  MCV 83.1 86.3  PLT 391 161   Basic Metabolic Panel: Recent Labs  Lab 05/24/20 1630 05/25/20 0501  NA 136 135  K 3.8 4.3  CL 99 99  CO2 22 21*  GLUCOSE 413* 487*  BUN 21 24*  CREATININE 1.41* 1.58*  CALCIUM 9.0 8.6*  MG  --  1.8  PHOS  --  4.2   GFR: Estimated Creatinine Clearance: 42.5 mL/min (A) (by C-G formula based on SCr of 1.58 mg/dL (H)). Liver Function Tests: Recent Labs  Lab 05/24/20 1630 05/25/20 0501  AST 11* 10*  ALT 11 11  ALKPHOS 104 96  BILITOT 0.9 1.0  PROT 6.5 6.0*  ALBUMIN 2.9* 2.7*   Recent Labs  Lab 05/24/20 1630  LIPASE 16   No results for input(s): AMMONIA in the last 168 hours. Coagulation Profile: Recent Labs  Lab 05/25/20 0501  INR 1.0   Cardiac Enzymes: No results for input(s): CKTOTAL, CKMB, CKMBINDEX, TROPONINI in the last 168 hours. BNP (last 3 results) No results for input(s): PROBNP in the last 8760 hours. HbA1C: No results for input(s): HGBA1C in the last 72 hours. CBG: Recent Labs  Lab 05/25/20 0911 05/25/20 1227  GLUCAP 502* 448*   Lipid  Profile: No results for input(s): CHOL, HDL, LDLCALC, TRIG, CHOLHDL, LDLDIRECT in the last 72 hours. Thyroid Function Tests: No results for input(s): TSH, T4TOTAL, FREET4, T3FREE, THYROIDAB in the last 72 hours. Anemia Panel: No results for input(s): VITAMINB12, FOLATE, FERRITIN, TIBC, IRON, RETICCTPCT in the last 72 hours. Sepsis Labs: No results for input(s): PROCALCITON, LATICACIDVEN in the last 168 hours.  Recent Results (from the past 240 hour(s))  Resp Panel by RT-PCR (Flu A&B, Covid) Nasopharyngeal Swab     Status: None   Collection Time: 05/17/20  8:08 AM   Specimen: Nasopharyngeal Swab; Nasopharyngeal(NP) swabs in vial transport medium  Result Value Ref Range Status   SARS Coronavirus 2 by RT PCR NEGATIVE NEGATIVE Final    Comment: (NOTE) SARS-CoV-2 target nucleic acids are NOT DETECTED.  The SARS-CoV-2 RNA is generally detectable in upper respiratory specimens during the  acute phase of infection. The lowest concentration of SARS-CoV-2 viral copies this assay can detect is 138 copies/mL. A negative result does not preclude SARS-Cov-2 infection and should not be used as the sole basis for treatment or other patient management decisions. A negative result may occur with  improper specimen collection/handling, submission of specimen other than nasopharyngeal swab, presence of viral mutation(s) within the areas targeted by this assay, and inadequate number of viral copies(<138 copies/mL). A negative result must be combined with clinical observations, patient history, and epidemiological information. The expected result is Negative.  Fact Sheet for Patients:  EntrepreneurPulse.com.au  Fact Sheet for Healthcare Providers:  IncredibleEmployment.be  This test is no t yet approved or cleared by the [place] FDA and  has been authorized for detection and/or diagnosis of SARS-CoV-2 by FDA under an Emergency Use Authorization (EUA). This EUA  will remain  in effect (meaning this test can be used) for the duration of the COVID-19 declaration under Section 564(b)(1) of the Act, 21 U.S.C.section 360bbb-3(b)(1), unless the authorization is terminated  or revoked sooner.       Influenza A by PCR NEGATIVE NEGATIVE Final   Influenza B by PCR NEGATIVE NEGATIVE Final    Comment: (NOTE) The Xpert Xpress SARS-CoV-2/FLU/RSV plus assay is intended as an aid in the diagnosis of influenza from Nasopharyngeal swab specimens and should not be used as a sole basis for treatment. Nasal washings and aspirates are unacceptable for Xpert Xpress SARS-CoV-2/FLU/RSV testing.  Fact Sheet for Patients: EntrepreneurPulse.com.au  Fact Sheet for Healthcare Providers: IncredibleEmployment.be  This test is not yet approved or cleared by the [place] FDA and has been authorized for detection and/or diagnosis of SARS-CoV-2 by FDA under an Emergency Use Authorization (EUA). This EUA will remain in effect (meaning this test can be used) for the duration of the COVID-19 declaration under Section 564(b)(1) of the Act, 21 U.S.C. section 360bbb-3(b)(1), unless the authorization is terminated or revoked.  Performed at [facility], [address], [place], [place] [address]   Blood culture (routine x 2)     Status: None   Collection Time: 05/17/20  9:36 AM   Specimen: BLOOD  Result Value Ref Range Status   Specimen Description BLOOD BLOOD LEFT ARM  Final   Special Requests   Final    BOTTLES DRAWN AEROBIC AND ANAEROBIC Blood Culture adequate volume   Culture   Final    NO GROWTH 5 DAYS Performed at [facility], [address] [place], [place] [address]    Report Status 05/22/2020 FINAL  Final  Blood culture (routine x 2)     Status: None   Collection Time: 05/17/20  9:41 AM   Specimen: BLOOD  Result Value Ref Range Status   Specimen Description BLOOD RIGHT ANTECUBITAL  Final   Special Requests   Final     BOTTLES DRAWN AEROBIC AND ANAEROBIC Blood Culture results may not be optimal due to an inadequate volume of blood received in culture bottles   Culture   Final    NO GROWTH 5 DAYS Performed at [facility], [address] [place], [place] [address]    Report Status 05/22/2020 FINAL  Final  Urine Culture     Status: Abnormal   Collection Time: 05/17/20 11:28 AM   Specimen: Urine, Random  Result Value Ref Range Status   Specimen Description   Final    URINE, RANDOM Performed at [facility], [address], [place], [place] [address]    Special Requests  Final    NONE Performed at [facility], [address], [place], [place] [address]    Culture >=100,000 COLONIES/mL ESCHERICHIA COLI (A)  Final   Report Status 05/19/2020 FINAL  Final   Organism ID, Bacteria ESCHERICHIA COLI (A)  Final      Susceptibility   Escherichia coli - MIC*    AMPICILLIN <=2 SENSITIVE Sensitive     CEFAZOLIN <=4 SENSITIVE Sensitive     CEFEPIME <=0.12 SENSITIVE Sensitive     CEFTRIAXONE <=0.25 SENSITIVE Sensitive     CIPROFLOXACIN <=0.25 SENSITIVE Sensitive     GENTAMICIN <=1 SENSITIVE Sensitive     IMIPENEM <=0.25 SENSITIVE Sensitive     NITROFURANTOIN <=16 SENSITIVE Sensitive     TRIMETH/SULFA <=20 SENSITIVE Sensitive     AMPICILLIN/SULBACTAM <=2 SENSITIVE Sensitive     PIP/TAZO <=4 SENSITIVE Sensitive     * >=100,000 COLONIES/mL ESCHERICHIA COLI  Resp Panel by RT-PCR (Flu A&B, Covid) Nasopharyngeal Swab     Status: None   Collection Time: 05/25/20 12:16 AM   Specimen: Nasopharyngeal Swab; Nasopharyngeal(NP) swabs in vial transport medium  Result Value Ref Range Status   SARS Coronavirus 2 by RT PCR NEGATIVE NEGATIVE Final    Comment: (NOTE) SARS-CoV-2 target nucleic acids are NOT DETECTED.  The SARS-CoV-2 RNA is generally detectable in upper respiratory specimens during the acute phase of infection. The lowest concentration of SARS-CoV-2 viral copies this assay can detect is 138 copies/mL. A  negative result does not preclude SARS-Cov-2 infection and should not be used as the sole basis for treatment or other patient management decisions. A negative result may occur with  improper specimen collection/handling, submission of specimen other than nasopharyngeal swab, presence of viral mutation(s) within the areas targeted by this assay, and inadequate number of viral copies(<138 copies/mL). A negative result must be combined with clinical observations, patient history, and epidemiological information. The expected result is Negative.  Fact Sheet for Patients:  EntrepreneurPulse.com.au  Fact Sheet for Healthcare Providers:  IncredibleEmployment.be  This test is no t yet approved or cleared by the [place] FDA and  has been authorized for detection and/or diagnosis of SARS-CoV-2 by FDA under an Emergency Use Authorization (EUA). This EUA will remain  in effect (meaning this test can be used) for the duration of the COVID-19 declaration under Section 564(b)(1) of the Act, 21 U.S.C.section 360bbb-3(b)(1), unless the authorization is terminated  or revoked sooner.       Influenza A by PCR NEGATIVE NEGATIVE Final   Influenza B by PCR NEGATIVE NEGATIVE Final    Comment: (NOTE) The Xpert Xpress SARS-CoV-2/FLU/RSV plus assay is intended as an aid in the diagnosis of influenza from Nasopharyngeal swab specimens and should not be used as a sole basis for treatment. Nasal washings and aspirates are unacceptable for Xpert Xpress SARS-CoV-2/FLU/RSV testing.  Fact Sheet for Patients: EntrepreneurPulse.com.au  Fact Sheet for Healthcare Providers: IncredibleEmployment.be  This test is not yet approved or cleared by the [place] FDA and has been authorized for detection and/or diagnosis of SARS-CoV-2 by FDA under an Emergency Use Authorization (EUA). This EUA will remain in effect (meaning this test can  be used) for the duration of the COVID-19 declaration under Section 564(b)(1) of the Act, 21 U.S.C. section 360bbb-3(b)(1), unless the authorization is terminated or revoked.  Performed at [facility], [address] [place], [place] [address]          Radiology Studies: DG Chest Portable 1 View  Result Date: 05/24/2020 CLINICAL DATA:  Cough,  short of breath, nausea and vomiting, diarrhea for 2 weeks EXAM: PORTABLE CHEST 1 VIEW COMPARISON:  05/17/2020 FINDINGS: Single frontal view of the chest demonstrates an unremarkable cardiac silhouette. Interstitial and ground-glass opacities throughout the lungs are again noted, with no significant change since prior study. No effusion or pneumothorax. No acute bony abnormalities. IMPRESSION: 1. Stable bilateral interstitial and ground-glass opacities, which could reflect edema or multifocal pneumonia. Electronically Signed   By: Randa Ngo M.D.   On: 05/24/2020 18:33        Scheduled Meds: . feeding supplement (GLUCERNA SHAKE)  237 mL Oral TID BM  . heparin  5,000 Units Subcutaneous Q8H  . insulin aspart  0-20 Units Subcutaneous TID WC  . insulin aspart  0-5 Units Subcutaneous QHS  . insulin glargine  20 Units Subcutaneous Daily   Continuous Infusions: . cefTRIAXone (ROCEPHIN)  IV Stopped (05/25/20 1006)     LOS: 1 day    Time spent: 30 minutes    Barb Merino, MD [facility] Pager [phone]

## 2020-05-25 NOTE — Progress Notes (Signed)
Inpatient Diabetes Program Recommendations  AACE/ADA: New Consensus Statement on Inpatient Glycemic Control (2015)  Target Ranges:  Prepandial:   less than 140 mg/dL      Peak postprandial:   less than 180 mg/dL (1-2 hours)      Critically ill patients:  140 - 180 mg/dL   Lab Results  Component Value Date   MBWGYK 599 (HH) 05/25/2020   HGBA1C 8.7 (H) 05/17/2020    Review of Glycemic Control Results for Susan [name], Susan [name] (MRN [id number]) as of 05/25/2020 09:15  Ref. Range 05/25/2020 09:11  Glucose-Capillary Latest Ref Range: 70 - 99 mg/dL 502 (HH)   Diabetes history: DM2 Outpatient Diabetes medications: OmniPod insulin pump with Novolin R, NPH 15 units in AM if CBGs >300 or 10 units if 200-300 mg/dl Current orders for Inpatient glycemic control: Novolog 0-20 units TID & HS Inpatient Diabetes Program Recommendations:    Consider ordering Lantus 15 Q24H. If patient is ordered diet and eating please consider ordering Novolog 4 units TID with meals.  Thanks, Bronson Curb, MSN, RNC-OB Diabetes Coordinator [phone] (8a-5p)

## 2020-05-25 NOTE — H&P (Signed)
History and Physical  Susan [name] WUJ:[id number] DOB: 1951-04-26 DOA: 05/24/2020  Referring physician: Rodney Booze, PA-C PCP: Alycia Rossetti, MD  Patient coming from: Home  Chief Complaint: Nausea, vomiting and diarrhea  HPI: Susan [name] is a 69 y.o. female with medical history significant for chronic diastolic congestive heart failure, essential hypertension, type 2 diabetes mellitus, CKD stage IV, obesity, peripheral vascular disease, and chronic neuropathy who presented to the ED via EMS due to ongoing nausea, vomiting and diarrhea.  She was admitted for same symptoms from 12/23-12/24 which was suspected to be due to viral gastroenteritis versus possible diabetic gastroparesis.  She complained of ongoing symptoms with an average of 3-4 watery diarrhea daily.  Patient states that she has not been taking her torsemide since she was discharged from the hospital due to inability to tolerate oral intake.  She complained of intermittent cough and some shortness of breath as well as with bilateral leg swelling (which patient does not think was worse than baseline).  She endorsed sharp chest pain in the left chest area with radiation to left shoulder.  She states that she took 2 nitroglycerin sublingual and the chest pain resolved.  She endorsed some abdominal pain which worsens with food.  ED Course:  In the emergency department, she was initially tachycardic and tachypneic.  BP was 195/89.  Work-up in the ED showed normocytic anemia, BUN to creatinine 21/1.41 (creatinine within baseline range) and hyperglycemia.  Troponin 10 > 84, BNP 886, urinalysis was unimpressive for UTI, albumin 2.9 Chest x-ray showed stable bilateral interstitial and ground-glass opacities, which could reflect edema or multifocal pneumonia. Aspirin was given, IV Lasix 20mg  x1 was given, insulin was given due to hyperglycemia.  Hospitalist was asked to admit patient for further evaluation and management.  Review of  Systems: Constitutional: Negative for chills and fever.  HENT: Negative for ear pain and sore throat.   Eyes: Negative for pain and visual disturbance.  Respiratory: Positive for cough and shortness of breath.   Cardiovascular: Negative for chest pain and palpitations.  Gastrointestinal: Negative for abdominal pain and vomiting.  Endocrine: Negative for polyphagia and polyuria.  Genitourinary: Negative for decreased urine volume, dysuria, enuresis Musculoskeletal: Negative for arthralgias and back pain.  Skin: Negative for color change and rash.  Allergic/Immunologic: Negative for immunocompromised state.  Neurological: Negative for tremors, syncope, speech difficulty, weakness, light-headedness and headaches.  Hematological: Does not bruise/bleed easily.  All other systems reviewed and are negative    Past Medical History:  Diagnosis Date  . Acute MI ([place]) 1999; 2007  . Anemia    hx  . Anginal pain ([place])   . Anxiety   . ARF (acute renal failure) ([place]) 06/2017   [facility]  . Arthritis    "generalized" (03/15/2014)  . CAD (coronary artery disease)    MI in 2000 - MI  2007 - treated bare metal stent (no nuclear since then as 9/11)  . Carotid artery disease ([place])   . CHF (congestive heart failure) ([place])   . Chronic diastolic heart failure (HCC)    a) ECHO (08/2013) EF 55-60% and RV function nl b) RHC (08/2013) RA 4, RV 30/5/7, PA 25/10 (16), PCWP 7, Fick CO/CI 6.3/2.7, PVR 1.5 WU, PA 61 and 66%  . Daily headache    "~ every other day; since I fell in June" (03/15/2014)  . Depression   . Dyslipidemia   . Dyspnea   . Exertional shortness of breath   . HTN (hypertension)   .  Hypothyroidism   . Neuropathy   . Obesity   . Osteoarthritis   . Peripheral neuropathy   . PONV (postoperative nausea and vomiting)   . RBBB (right bundle branch block)    Old  . Stroke [facility])    mini strokes  . Syncope    likely due to low blood sugar  . Tachycardia    Sinus tachycardia   . Type II diabetes mellitus (HCC)    Type II  . Urinary incontinence   . Venous insufficiency    Past Surgical History:  Procedure Laterality Date  . ABDOMINAL HYSTERECTOMY  1980's  . AMPUTATION Right 02/24/2018   Procedure: RIGHT FOOT GREAT TOE AND 2ND TOE AMPUTATION;  Surgeon: Newt Minion, MD;  Location: [place];  Service: Orthopedics;  Laterality: Right;  . AMPUTATION Right 04/30/2018   Procedure: RIGHT TRANSMETATARSAL AMPUTATION;  Surgeon: Newt Minion, MD;  Location: [place];  Service: Orthopedics;  Laterality: Right;  . CATARACT EXTRACTION, BILATERAL Bilateral ?2013  . COLONOSCOPY W/ POLYPECTOMY    . COLONOSCOPY WITH PROPOFOL N/A 03/13/2019   Procedure: COLONOSCOPY WITH PROPOFOL;  Surgeon: Jerene Bears, MD;  Location: [place];  Service: Gastroenterology;  Laterality: N/A;  . [place]; 2007   "1 + 1"  . ESOPHAGOGASTRODUODENOSCOPY (EGD) WITH PROPOFOL N/A 03/13/2019   Procedure: ESOPHAGOGASTRODUODENOSCOPY (EGD) WITH PROPOFOL;  Surgeon: Jerene Bears, MD;  Location[facility] ENDOSCOPY;  Service: Gastroenterology;  Laterality: N/A;  . EYE SURGERY Bilateral    [place]  . HEMOSTASIS CLIP PLACEMENT  03/13/2019   Procedure: HEMOSTASIS CLIP PLACEMENT;  Surgeon: Jerene Bears, MD;  Location: [facility] ENDOSCOPY;  Service: Gastroenterology;;  . KNEE ARTHROSCOPY Left 10/25/2006  . POLYPECTOMY  03/13/2019   Procedure: POLYPECTOMY;  Surgeon: Jerene Bears, MD;  Location: [place];  Service: Gastroenterology;;  . RIGHT HEART CATH N/A 07/24/2017   Procedure: RIGHT HEART CATH;  Surgeon: Jolaine Artist, MD;  Location: [place] CV LAB;  Service: Cardiovascular;  Laterality: N/A;  . RIGHT HEART CATHETERIZATION N/A 09/22/2013   Procedure: RIGHT HEART CATH;  Surgeon: Jolaine Artist, MD;  Location: [facility] CATH LAB;  Service: Cardiovascular;  Laterality: N/A;  . SHOULDER ARTHROSCOPY WITH OPEN ROTATOR CUFF REPAIR Right 03/14/2014   Procedure: RIGHT SHOULDER  ARTHROSCOPY WITH BICEPS RELEASE, OPEN SUBSCAPULA REPAIR, OPEN SUPRASPINATUS REPAIR.;  Surgeon: Meredith Pel, MD;  Location: [place];  Service: Orthopedics;  Laterality: Right;  . TOE AMPUTATION Right 02/24/2018   GREAT TOE AND 2ND TOE AMPUTATION  . TUBAL LIGATION  1970's    Social History:  reports that she quit smoking about 22 years ago. Her smoking use included cigarettes. She has a 96.00 pack-year smoking history. She has never used smokeless tobacco. She reports previous alcohol use. She reports that she does not use drugs.   Allergies  Allergen Reactions  . Codeine Nausea And Vomiting    Family History  Problem Relation Age of Onset  . Heart attack Mother 32   Prior to Admission medications   Medication Sig Start Date End Date Taking? Authorizing Provider  albuterol (VENTOLIN HFA) 108 (90 Base) MCG/ACT inhaler TAKE 2 PUFFS BY MOUTH EVERY 6 HOURS AS NEEDED FOR WHEEZE OR SHORTNESS OF BREATH Patient taking differently: Inhale 2 puffs into the lungs every 6 (six) hours as needed for wheezing or shortness of breath. 06/27/19   Alycia Rossetti, MD  allopurinol (ZYLOPRIM) 100 MG tablet TAKE 1 TABLET BY MOUTH TWICE A DAY Patient  taking differently: Take 100 mg by mouth 2 (two) times daily. 08/02/19   Newt Minion, MD  Ascorbic Acid (VITAMIN C) 1000 MG tablet Take 1,000 mg by mouth daily.    [provider]  aspirin EC 81 MG tablet Take 81 mg by mouth every morning.     [provider]  carvedilol (COREG) 12.5 MG tablet Take 1 tablet (12.5 mg total) by mouth 2 (two) times daily with a meal. 02/13/20   [place], [name], MD  cephALEXin (KEFLEX) 250 MG capsule Take 1 capsule (250 mg total) by mouth 4 (four) times daily for 5 days. 05/21/20 05/26/20  Alycia Rossetti, MD  cholecalciferol (VITAMIN D) 1000 units tablet Take 1,000 Units by mouth daily with supper.     [provider]  clopidogrel (PLAVIX) 75 MG tablet Take 1 tablet (75 mg total) by mouth daily with  breakfast. 05/23/20   Clegg, Amy D, NP  ferrous sulfate 325 (65 FE) MG tablet Take 1 tablet (325 mg total) by mouth 2 (two) times daily with a meal. Reported on 08/21/2015 06/07/18   Alycia Rossetti, MD  guaiFENesin-dextromethorphan [facility] DM) 100-10 MG/5ML syrup Take 10 mLs by mouth every 4 (four) hours as needed for cough. 03/13/20   [place] Donnamarie Poag, DO  HYDROcodone-acetaminophen (NORCO) 5-325 MG tablet Take 1 tablet by mouth every 8 (eight) hours as needed for moderate pain. Patient not taking: No sig reported 04/04/20   Alycia Rossetti, MD  Insulin Disposable Pump (OMNIPOD DASH 5 PACK PODS) MISC Inject into the skin See admin instructions. Use continuously with Novolin R - change every 72 hours. 11/18/18   [provider]  insulin NPH Human (HUMULIN N,NOVOLIN N) 100 UNIT/ML injection Inject 10-15 Units into the skin See admin instructions. Inject 15 units subcutaneously in the morning if CBG >300; inject 10 units 200-300    [provider]  insulin regular (NOVOLIN R) 100 units/mL injection Inject into the skin See admin instructions. Manually add bolus to continuous dose via OmniPod 3 times daily per sliding scale (CBG 80-150 7 units, 151-200 9 units, 201-250 12 units, 251-300 14 units, 301- 400 17 units)    [provider]  levothyroxine (SYNTHROID, LEVOTHROID) 50 MCG tablet Take 1 tablet (50 mcg total) by mouth daily before breakfast. 11/07/16   Dena Billet B, PA-C  magnesium oxide (MAG-OX) 400 MG tablet TAKE 1 TABLET BY MOUTH EVERY DAY Patient taking differently: Take 400 mg by mouth daily. 09/13/19   [place], [name], MD  metolazone (ZAROXOLYN) 2.5 MG tablet Take 2.5 mg by mouth See admin instructions. Take 1 tablet by mouth on Monday and Fridays    [provider]  nitroGLYCERIN (NITROSTAT) 0.4 MG SL tablet Place 1 tablet (0.4 mg total) under the tongue every 5 (five) minutes as needed for chest pain. Patient taking differently: Place 0.4 mg under the  tongue every 5 (five) minutes as needed for chest pain (max 3 doses). 04/28/19   Clegg, Amy D, NP  ondansetron (ZOFRAN-ODT) 8 MG disintegrating tablet Take 1 tablet (8 mg total) by mouth 3 (three) times daily. Patient taking differently: Take 8 mg by mouth 3 (three) times daily as needed for nausea or vomiting. 02/27/20   [place], [name], MD  pantoprazole (PROTONIX) 40 MG tablet TAKE 1 TABLET BY MOUTH EVERY DAY Patient taking differently: Take 40 mg by mouth daily. 01/04/20   [place], [name], MD  pregabalin (LYRICA) 150 MG capsule Take 1 capsule (150 mg  total) by mouth 2 (two) times daily. 11/12/16   Alycia Rossetti, MD  torsemide (DEMADEX) 20 MG tablet Take 2 tablets (40 mg total) by mouth 2 (two) times daily. Patient taking differently: Take 60 mg by mouth 2 (two) times daily. 02/10/20   [place], Marlowe Aschoff, MD  traZODone (DESYREL) 100 MG tablet TAKE 1 AND 1/2 TABLETS BY MOUTH AT BEDTIME AS NEEDED FOR SLEEP. Patient taking differently: Take 100 mg by mouth at bedtime. 01/26/20   Alycia Rossetti, MD  ursodiol (ACTIGALL) 500 MG tablet Take 500 mg by mouth 3 (three) times daily. 09/09/19   [provider]  vitamin E 1000 UNIT capsule Take 1,000 Units by mouth daily with supper.     [provider]    Physical Exam: BP (!) 145/78   Pulse 90   Temp 98.3 F (36.8 C) (Oral)   Resp 17   SpO2 100%   . General: 69 y.o. year-old female well developed well nourished in no acute distress.  Alert and oriented x3. Marland Kitchen HEENT: NCAT, EOMI . Neck: Supple, trachea medial . Cardiovascular: Regular rate and rhythm with no rubs or gallops.  No thyromegaly or JVD noted. 2/4 pulses in all 4 extremities. Marland Kitchen Respiratory: Bilateral rales in lower lobes.  No wheezes  . Abdomen: Soft, mild tenderness to palpation of epigastric area. Normal bowel sounds x4 quadrants. . Muskuloskeletal: No cyanosis,  edema noted bilaterally . Neuro: CN II-XII intact, strength, sensation, reflexes . Skin: No ulcerative  lesions noted or rashes . Psychiatry: Judgement and insight appear normal. Mood is appropriate for condition and setting          Labs on Admission:  Basic Metabolic Panel: Recent Labs  Lab 05/18/20 0453 05/24/20 1630  NA 136 136  K 3.9 3.8  CL 99 99  CO2 26 22  GLUCOSE 123* 413*  BUN 28* 21  CREATININE 1.78* 1.41*  CALCIUM 8.5* 9.0  MG 2.1  --    Liver Function Tests: Recent Labs  Lab 05/18/20 0453 05/24/20 1630  AST 11* 11*  ALT 8 11  ALKPHOS 97 104  BILITOT 0.6 0.9  PROT 5.5* 6.5  ALBUMIN 2.5* 2.9*   Recent Labs  Lab 05/24/20 1630  LIPASE 16   No results for input(s): AMMONIA in the last 168 hours. CBC: Recent Labs  Lab 05/18/20 0453 05/24/20 1630  WBC 7.9 6.6  HGB 10.4* 11.4*  HCT 34.1* 36.8  MCV 85.9 83.1  PLT 276 391   Cardiac Enzymes: No results for input(s): CKTOTAL, CKMB, CKMBINDEX, TROPONINI in the last 168 hours.  BNP (last 3 results) Recent Labs    03/07/20 1816 05/17/20 0920 05/24/20 1755  BNP 1,301.4* 590.0* 886.0*    ProBNP (last 3 results) No results for input(s): PROBNP in the last 8760 hours.  CBG: Recent Labs  Lab 05/18/20 0602 05/18/20 0748 05/18/20 1030 05/18/20 1402  GLUCAP 139* 126* 161* 239*    Radiological Exams on Admission: DG Chest Portable 1 View  Result Date: 05/24/2020 CLINICAL DATA:  Cough, short of breath, nausea and vomiting, diarrhea for 2 weeks EXAM: PORTABLE CHEST 1 VIEW COMPARISON:  05/17/2020 FINDINGS: Single frontal view of the chest demonstrates an unremarkable cardiac silhouette. Interstitial and ground-glass opacities throughout the lungs are again noted, with no significant change since prior study. No effusion or pneumothorax. No acute bony abnormalities. IMPRESSION: 1. Stable bilateral interstitial and ground-glass opacities, which could reflect edema or multifocal pneumonia. Electronically Signed   By: Diana Eves.D.  On: 05/24/2020 18:33    EKG: I independently viewed the EKG done  and my findings are as followed: Sinus rhythm at rate of 99 bpm.  RBBB, QTc 533 ms  Assessment/Plan Present on Admission: . Obesity, Class III, BMI 40-49.9 (morbid obesity) ([place]) . Essential hypertension . Prolonged QT interval  Principal Problem:   Nausea vomiting and diarrhea Active Problems:   Essential hypertension   Hyperglycemia due to diabetes mellitus (HCC)   Elevated troponin   Hypothyroidism   Prolonged QT interval   Obesity, Class III, BMI 40-49.9 (morbid obesity) (HCC)   Acute exacerbation of CHF (congestive heart failure) (HCC)   Chronic kidney disease (CKD)   Hypoalbuminemia   GERD (gastroesophageal reflux disease)   Nausea, vomiting and diarrhea  Patient continued to complain of nausea, vomiting or diarrhea, this was suspected to be due to a viral gastroenteritis vs diabetic gastroparesis Continue clear liquid diet and advance diet as tolerated Continue IV Compazine 10 mg every 6 hours as needed C. diff and GI panel will be checked  Presumed acute exacerbation of CHF BNP 886 (this was 590 on 12/23) Chest x-ray was suggestive of pulmonary edema IV Lasix 20 Mg x1 was given in the ED, continue IV Lasix 40 mg twice daily Continue Coreg per home regimen Continue total input/output, daily weights and fluid restriction Continue Cardiac diet  Echocardiogram done in September 2021 showed LVEF of 55 to 60% with normal LV function.  Questionable multifocal pneumonia Patient was afebrile, WBC was normal, patient does not present with cough, chest congestion or shortness of breath at bedside.  This is possibly due to pulmonary edema as suggested above for CHF  Essential hypertension (uncontrolled) Continue IV Lasix as described above Continue Coreg  Hyperglycemia secondary to type 2 diabetes mellitus Continue insulin sliding scale and hypoglycemia protocol  Prolonged QTc QTc 533 Avoid QT prolonging drugs Magnesium level will be checked Continue telemetry  CKD  stage IIIb BUN to creatinine 21/1.41 (creatinine within baseline range) Renally adjust medications, avoid nephrotoxic agents/dehydration/hypotension  Hypoalbuminemia probably secondary to moderate protein calorie malnutrition Protein supplement will be provided  GERD Continue Protonix  Obesity class III  (BMI 40.60) Patient will be counseled on diet and lifestyle modification when more stable   DVT prophylaxis: Heparin Code Status: Full Family Communication:  None at bedside Disposition Plan:Admit for evaluation of N/V/D Consults called: None Admission status:  Observation   Bernadette Hoit MD [facility]  05/25/2020, 2:45 AM

## 2020-05-26 DIAGNOSIS — B3781 Candidal esophagitis: Principal | ICD-10-CM

## 2020-05-26 DIAGNOSIS — R112 Nausea with vomiting, unspecified: Secondary | ICD-10-CM | POA: Diagnosis not present

## 2020-05-26 DIAGNOSIS — R197 Diarrhea, unspecified: Secondary | ICD-10-CM | POA: Diagnosis not present

## 2020-05-26 LAB — COMPREHENSIVE METABOLIC PANEL
ALT: 11 U/L (ref 0–44)
AST: 9 U/L — ABNORMAL LOW (ref 15–41)
Albumin: 2.6 g/dL — ABNORMAL LOW (ref 3.5–5.0)
Alkaline Phosphatase: 92 U/L (ref 38–126)
Anion gap: 8 (ref 5–15)
BUN: 23 mg/dL (ref 8–23)
CO2: 28 mmol/L (ref 22–32)
Calcium: 8.7 mg/dL — ABNORMAL LOW (ref 8.9–10.3)
Chloride: 98 mmol/L (ref 98–111)
Creatinine, Ser: 1.48 mg/dL — ABNORMAL HIGH (ref 0.44–1.00)
GFR, Estimated: 38 mL/min — ABNORMAL LOW (ref >60)
Glucose, Bld: 288 mg/dL — ABNORMAL HIGH (ref 70–99)
Potassium: 3.8 mmol/L (ref 3.5–5.1)
Sodium: 134 mmol/L — ABNORMAL LOW (ref 135–145)
Total Bilirubin: 0.5 mg/dL (ref 0.3–1.2)
Total Protein: 5.7 g/dL — ABNORMAL LOW (ref 6.5–8.1)

## 2020-05-26 LAB — GASTROINTESTINAL PANEL BY PCR, STOOL (REPLACES STOOL CULTURE)

## 2020-05-26 LAB — CBC WITH DIFFERENTIAL/PLATELET
Abs Immature Granulocytes: 0.03 10*3/uL (ref 0.00–0.07)
Basophils Absolute: 0 10*3/uL (ref 0.0–0.1)
Basophils Relative: 1 %
Eosinophils Absolute: 0.5 10*3/uL (ref 0.0–0.5)
Eosinophils Relative: 9 %
HCT: 35.8 % — ABNORMAL LOW (ref 36.0–46.0)
Hemoglobin: 10.8 g/dL — ABNORMAL LOW (ref 12.0–15.0)
Immature Granulocytes: 1 %
Lymphocytes Relative: 15 %
Lymphs Abs: 0.9 10*3/uL (ref 0.7–4.0)
MCH: 25.8 pg — ABNORMAL LOW (ref 26.0–34.0)
MCHC: 30.2 g/dL (ref 30.0–36.0)
MCV: 85.6 fL (ref 80.0–100.0)
Monocytes Absolute: 0.5 10*3/uL (ref 0.1–1.0)
Monocytes Relative: 8 %
Neutro Abs: 4 10*3/uL (ref 1.7–7.7)
Neutrophils Relative %: 66 %
Platelets: 321 10*3/uL (ref 150–400)
RBC: 4.18 MIL/uL (ref 3.87–5.11)
RDW: 15.6 % — ABNORMAL HIGH (ref 11.5–15.5)
WBC: 6 10*3/uL (ref 4.0–10.5)
nRBC: 0 % (ref 0.0–0.2)

## 2020-05-26 LAB — GLUCOSE, CAPILLARY
Glucose-Capillary: 321 mg/dL — ABNORMAL HIGH (ref 70–99)
Glucose-Capillary: 298 mg/dL — ABNORMAL HIGH (ref 70–99)
Glucose-Capillary: 211 mg/dL — ABNORMAL HIGH (ref 70–99)
Glucose-Capillary: 234 mg/dL — ABNORMAL HIGH (ref 70–99)

## 2020-05-26 LAB — MAGNESIUM: Magnesium: 2 mg/dL (ref 1.7–2.4)

## 2020-05-26 LAB — PHOSPHORUS: Phosphorus: 3.6 mg/dL (ref 2.5–4.6)

## 2020-05-26 NOTE — Progress Notes (Signed)
Patient transferred to low air loss mattress.

## 2020-05-26 NOTE — Consult Note (Addendum)
Consulting  Provider:  Primary Care Physician:  Alycia Rossetti, MD Primary Gastroenterologist:  Dr. Collene Mares  Reason for Consultation:  Intractable Nausea and vomiting  HPI:  Susan [name] is a 70 y.o. female with a past medical history of diabetes, stage IV CKD, vascular disease, who presented to [facility] ER 05/24/2020 with intractable nausea and vomiting.  Patient had a recent admission to our local facility 05/17/2020 as well.  She responded well to conservative measures with IV fluids and antiemetics and was discharged home the next day.  She states she has been "sick" for the last 4 weeks with this.  Notes sudden onset nausea and vomiting at that time.  She states the last 7 to 10 days she has had watery diarrhea as well.  Does note some abdominal discomfort starting yesterday, midepigastric.  Does not radiate.  Patient has been unable to tolerate clear liquids during this admission.  She does have chronic diabetes which is not under control.  CT abdomen pelvis 05/17/2020 which I personally reviewed showed mild intrahepatic ductal dilatation, cholelithiasis, small bilateral effusions, otherwise unremarkable.  Last EGD October 2020 showed large gastric polyp which was removed with hot snare.  She was admitted to the hospital with GI bleeding at that time.  Pathology positive for hyperplastic polyp with GIM.  Colonoscopy unrevealing besides 3-4 small tubular adenomas.  Appear that she had a liver biopsy in March 2021 due to elevated LFTs which showed signs of drug-induced hepatitis.  She follows with Dr. Collene Mares for her gastroenterological issues.  Past Medical History:  Diagnosis Date  . Acute MI ([place]) 1999; 2007  . Anemia    hx  . Anginal pain ([place])   . Anxiety   . ARF (acute renal failure) ([place]) 06/2017   [facility]  . Arthritis    "generalized" (03/15/2014)  . CAD (coronary artery disease)    MI in 2000 - MI  2007 - treated bare metal stent (no nuclear since then as 9/11)   . Carotid artery disease ([place])   . CHF (congestive heart failure) ([place])   . Chronic diastolic heart failure (HCC)    a) ECHO (08/2013) EF 55-60% and RV function nl b) RHC (08/2013) RA 4, RV 30/5/7, PA 25/10 (16), PCWP 7, Fick CO/CI 6.3/2.7, PVR 1.5 WU, PA 61 and 66%  . Daily headache    "~ every other day; since I fell in June" (03/15/2014)  . Depression   . Dyslipidemia   . Dyspnea   . Exertional shortness of breath   . HTN (hypertension)   . Hypothyroidism   . Neuropathy   . Obesity   . Osteoarthritis   . Peripheral neuropathy   . PONV (postoperative nausea and vomiting)   . RBBB (right bundle branch block)    Old  . Stroke [facility])    mini strokes  . Syncope    likely due to low blood sugar  . Tachycardia    Sinus tachycardia  . Type II diabetes mellitus (HCC)    Type II  . Urinary incontinence   . Venous insufficiency     Past Surgical History:  Procedure Laterality Date  . ABDOMINAL HYSTERECTOMY  1980's  . AMPUTATION Right 02/24/2018   Procedure: RIGHT FOOT GREAT TOE AND 2ND TOE AMPUTATION;  Surgeon: Newt Minion, MD;  Location: [place];  Service: Orthopedics;  Laterality: Right;  . AMPUTATION Right 04/30/2018   Procedure: RIGHT TRANSMETATARSAL AMPUTATION;  Surgeon: Newt Minion, MD;  Location: [place];  Service: Orthopedics;  Laterality: Right;  . CATARACT EXTRACTION, BILATERAL Bilateral ?2013  . COLONOSCOPY W/ POLYPECTOMY    . COLONOSCOPY WITH PROPOFOL N/A 03/13/2019   Procedure: COLONOSCOPY WITH PROPOFOL;  Surgeon: Jerene Bears, MD;  Location: [place];  Service: Gastroenterology;  Laterality: N/A;  . [place]; 2007   "1 + 1"  . ESOPHAGOGASTRODUODENOSCOPY (EGD) WITH PROPOFOL N/A 03/13/2019   Procedure: ESOPHAGOGASTRODUODENOSCOPY (EGD) WITH PROPOFOL;  Surgeon: Jerene Bears, MD;  Location: [facility] ENDOSCOPY;  Service: Gastroenterology;  Laterality: N/A;  . EYE SURGERY Bilateral    lazer  . HEMOSTASIS CLIP PLACEMENT  03/13/2019    Procedure: HEMOSTASIS CLIP PLACEMENT;  Surgeon: Jerene Bears, MD;  Location: [facility] ENDOSCOPY;  Service: Gastroenterology;;  . KNEE ARTHROSCOPY Left 10/25/2006  . POLYPECTOMY  03/13/2019   Procedure: POLYPECTOMY;  Surgeon: Jerene Bears, MD;  Location: [place];  Service: Gastroenterology;;  . RIGHT HEART CATH N/A 07/24/2017   Procedure: RIGHT HEART CATH;  Surgeon: Jolaine Artist, MD;  Location: [place] CV LAB;  Service: Cardiovascular;  Laterality: N/A;  . RIGHT HEART CATHETERIZATION N/A 09/22/2013   Procedure: RIGHT HEART CATH;  Surgeon: Jolaine Artist, MD;  Location: [facility] CATH LAB;  Service: Cardiovascular;  Laterality: N/A;  . SHOULDER ARTHROSCOPY WITH OPEN ROTATOR CUFF REPAIR Right 03/14/2014   Procedure: RIGHT SHOULDER ARTHROSCOPY WITH BICEPS RELEASE, OPEN SUBSCAPULA REPAIR, OPEN SUPRASPINATUS REPAIR.;  Surgeon: Meredith Pel, MD;  Location: [place];  Service: Orthopedics;  Laterality: Right;  . TOE AMPUTATION Right 02/24/2018   GREAT TOE AND 2ND TOE AMPUTATION  . TUBAL LIGATION  1970's    Prior to Admission medications   Medication Sig Start Date End Date Taking? Authorizing Provider  albuterol (VENTOLIN HFA) 108 (90 Base) MCG/ACT inhaler TAKE 2 PUFFS BY MOUTH EVERY 6 HOURS AS NEEDED FOR WHEEZE OR SHORTNESS OF BREATH Patient taking differently: Inhale 2 puffs into the lungs every 6 (six) hours as needed for wheezing or shortness of breath. 06/27/19   [place], [name], MD  allopurinol (ZYLOPRIM) 100 MG tablet TAKE 1 TABLET BY MOUTH TWICE A DAY Patient taking differently: Take 100 mg by mouth 2 (two) times daily. 08/02/19   Newt Minion, MD  Ascorbic Acid (VITAMIN C) 1000 MG tablet Take 1,000 mg by mouth daily.    [provider]  aspirin EC 81 MG tablet Take 81 mg by mouth every morning.     [provider]  carvedilol (COREG) 12.5 MG tablet Take 1 tablet (12.5 mg total) by mouth 2 (two) times daily with a meal. 02/13/20   [place], [name], MD  cephALEXin  (KEFLEX) 250 MG capsule Take 1 capsule (250 mg total) by mouth 4 (four) times daily for 5 days. 05/21/20 05/26/20  Alycia Rossetti, MD  cholecalciferol (VITAMIN D) 1000 units tablet Take 1,000 Units by mouth daily with supper.     [provider]  clopidogrel (PLAVIX) 75 MG tablet Take 1 tablet (75 mg total) by mouth daily with breakfast. 05/23/20   Clegg, Amy D, NP  ferrous sulfate 325 (65 FE) MG tablet Take 1 tablet (325 mg total) by mouth 2 (two) times daily with a meal. Reported on 08/21/2015 06/07/18   Alycia Rossetti, MD  guaiFENesin-dextromethorphan [facility] DM) 100-10 MG/5ML syrup Take 10 mLs by mouth every 4 (four) hours as needed for cough. 03/13/20   [place] Donnamarie Poag, DO  HYDROcodone-acetaminophen (NORCO) 5-325 MG tablet Take 1 tablet by mouth every  8 (eight) hours as needed for moderate pain. Patient not taking: No sig reported 04/04/20   Alycia Rossetti, MD  Insulin Disposable Pump (OMNIPOD DASH 5 PACK PODS) MISC Inject into the skin See admin instructions. Use continuously with Novolin R - change every 72 hours. 11/18/18   [provider]  insulin NPH Human (HUMULIN N,NOVOLIN N) 100 UNIT/ML injection Inject 10-15 Units into the skin See admin instructions. Inject 15 units subcutaneously in the morning if CBG >300; inject 10 units 200-300    [provider]  insulin regular (NOVOLIN R) 100 units/mL injection Inject into the skin See admin instructions. Manually add bolus to continuous dose via OmniPod 3 times daily per sliding scale (CBG 80-150 7 units, 151-200 9 units, 201-250 12 units, 251-300 14 units, 301- 400 17 units)    [provider]  levothyroxine (SYNTHROID, LEVOTHROID) 50 MCG tablet Take 1 tablet (50 mcg total) by mouth daily before breakfast. 11/07/16   Dena Billet B, PA-C  magnesium oxide (MAG-OX) 400 MG tablet TAKE 1 TABLET BY MOUTH EVERY DAY Patient taking differently: Take 400 mg by mouth daily. 09/13/19   [place], [name], MD   metolazone (ZAROXOLYN) 2.5 MG tablet Take 2.5 mg by mouth See admin instructions. Take 1 tablet by mouth on Monday and Fridays    [provider]  nitroGLYCERIN (NITROSTAT) 0.4 MG SL tablet Place 1 tablet (0.4 mg total) under the tongue every 5 (five) minutes as needed for chest pain. Patient taking differently: Place 0.4 mg under the tongue every 5 (five) minutes as needed for chest pain (max 3 doses). 04/28/19   Clegg, Amy D, NP  ondansetron (ZOFRAN-ODT) 8 MG disintegrating tablet Take 1 tablet (8 mg total) by mouth 3 (three) times daily. Patient taking differently: Take 8 mg by mouth 3 (three) times daily as needed for nausea or vomiting. 02/27/20   [place], [name], MD  pantoprazole (PROTONIX) 40 MG tablet TAKE 1 TABLET BY MOUTH EVERY DAY Patient taking differently: Take 40 mg by mouth daily. 01/04/20   , [name], MD  pregabalin (LYRICA) 150 MG capsule Take 1 capsule (150 mg total) by mouth 2 (two) times daily. 11/12/16   Alycia Rossetti, MD  torsemide (DEMADEX) 20 MG tablet Take 2 tablets (40 mg total) by mouth 2 (two) times daily. Patient taking differently: Take 60 mg by mouth 2 (two) times daily. 02/10/20   [place], Marlowe Aschoff, MD  traZODone (DESYREL) 100 MG tablet TAKE 1 AND 1/2 TABLETS BY MOUTH AT BEDTIME AS NEEDED FOR SLEEP. Patient taking differently: Take 100 mg by mouth at bedtime. 01/26/20   Alycia Rossetti, MD  ursodiol (ACTIGALL) 500 MG tablet Take 500 mg by mouth 3 (three) times daily. 09/09/19   [provider]  vitamin E 1000 UNIT capsule Take 1,000 Units by mouth daily with supper.     [provider]    Current Facility-Administered Medications  Medication Dose Route Frequency Provider Last Rate Last Admin  . albuterol (VENTOLIN HFA) 108 (90 Base) MCG/ACT inhaler 2 puff  2 puff Inhalation Q6H PRN Barb Merino, MD      . allopurinol (ZYLOPRIM) tablet 100 mg  100 mg Oral BID Barb Merino, MD   100 mg at 05/26/20 0843  . ascorbic acid (VITAMIN C)  tablet 1,000 mg  1,000 mg Oral Daily Barb Merino, MD   1,000 mg at 05/26/20 0842  . aspirin EC tablet 81 mg  81 mg Oral q morning - 10a Ghimire, Dante Gang,  MD   81 mg at 05/26/20 0844  . carvedilol (COREG) tablet 12.5 mg  12.5 mg Oral BID WC Barb Merino, MD   12.5 mg at 05/26/20 0843  . cefTRIAXone (ROCEPHIN) 1 g in sodium chloride 0.9 % 100 mL IVPB  1 g Intravenous Q24H Barb Merino, MD 200 mL/hr at 05/26/20 1108 1 g at 05/26/20 1108  . cholecalciferol (VITAMIN D3) tablet 1,000 Units  1,000 Units Oral Q supper Barb Merino, MD      . clopidogrel (PLAVIX) tablet 75 mg  75 mg Oral Q breakfast Barb Merino, MD   75 mg at 05/26/20 0843  . feeding supplement (GLUCERNA SHAKE) (GLUCERNA SHAKE) liquid 237 mL  237 mL Oral TID BM Adefeso, Oladapo, DO   237 mL at 05/25/20 2201  . ferrous sulfate tablet 325 mg  325 mg Oral BID WC Barb Merino, MD   325 mg at 05/26/20 0843  . heparin injection 5,000 Units  5,000 Units Subcutaneous Q8H Adefeso, Oladapo, DO   5,000 Units at 05/26/20 0450  . insulin aspart (novoLOG) injection 0-20 Units  0-20 Units Subcutaneous TID WC Adefeso, Oladapo, DO   11 Units at 05/26/20 1206  . insulin aspart (novoLOG) injection 0-5 Units  0-5 Units Subcutaneous QHS Adefeso, Oladapo, DO      . insulin glargine (LANTUS) injection 20 Units  20 Units Subcutaneous Daily Barb Merino, MD   20 Units at 05/26/20 0845  . levothyroxine (SYNTHROID) tablet 50 mcg  50 mcg Oral Q0600 Barb Merino, MD   50 mcg at 05/26/20 0450  . magnesium oxide (MAG-OX) tablet 400 mg  400 mg Oral Daily Barb Merino, MD   400 mg at 05/26/20 0843  . [START ON 05/28/2020] metolazone (ZAROXOLYN) tablet 2.5 mg  2.5 mg Oral Once per day on Mon Fri Ghimire, Dante Gang, MD      . nitroGLYCERIN (NITROSTAT) SL tablet 0.4 mg  0.4 mg Sublingual Q5 min PRN Barb Merino, MD   0.4 mg at 05/25/20 2237  . pantoprazole (PROTONIX) EC tablet 40 mg  40 mg Oral Daily Barb Merino, MD   40 mg at 05/26/20 0844  . pregabalin  (LYRICA) capsule 150 mg  150 mg Oral BID Barb Merino, MD   150 mg at 05/26/20 0843  . torsemide (DEMADEX) tablet 60 mg  60 mg Oral BID Barb Merino, MD   60 mg at 05/26/20 0842  . traZODone (DESYREL) tablet 100 mg  100 mg Oral QHS Barb Merino, MD   100 mg at 05/25/20 2237  . ursodiol (ACTIGALL) capsule 600 mg  600 mg Oral TID Barb Merino, MD   600 mg at 05/26/20 0843  . vitamin E capsule 1,000 Units  1,000 Units Oral Q supper Barb Merino, MD        Allergies as of 05/24/2020 - Review Complete 05/24/2020  Allergen Reaction Noted  . Codeine Nausea And Vomiting     Family History  Problem Relation Age of Onset  . Heart attack Mother 45    Social History   Socioeconomic History  . Marital status: Married    Spouse name: Not on file  . Number of children: 3  . Years of education: 12th  . Highest education level: Not on file  Occupational History    Employer: UNEMPLOYED  Tobacco Use  . Smoking status: Former Smoker    Packs/day: 3.00    Years: 32.00    Pack years: 96.00    Types: Cigarettes    Quit date: 10/24/1997  Years since quitting: 22.6  . Smokeless tobacco: Never Used  Vaping Use  . Vaping Use: Never used  Substance and Sexual Activity  . Alcohol use: Not Currently    Comment: "might have 2-3 daiquiris in the summer"  . Drug use: No  . Sexual activity: Never  Other Topics Concern  . Not on file  Social History Narrative   Pt lives at home with her spouse.   Caffeine Use- 3 sodas daily   Social Determinants of Health   Financial Resource Strain: Not on file  Food Insecurity: Not on file  Transportation Needs: No Transportation Needs  . Lack of Transportation (Medical): No  . Lack of Transportation (Non-Medical): No  Physical Activity: Inactive  . Days of Exercise per Week: 0 days  . Minutes of Exercise per Session: 10 min  Stress: Not on file  Social Connections: Not on file  Intimate Partner Violence: Not on file    Review of  Systems: General: Negative for anorexia, weight loss, fever, chills, fatigue, weakness. Eyes: Negative for vision changes.  ENT: Negative for hoarseness, difficulty swallowing , nasal congestion. CV: Negative for chest pain, angina, palpitations, dyspnea on exertion, peripheral edema.  Respiratory: Negative for dyspnea at rest, dyspnea on exertion, cough, sputum, wheezing.  GI: See history of present illness. GU:  Negative for dysuria, hematuria, urinary incontinence, urinary frequency, nocturnal urination.  MS: Negative for joint pain, low back pain.  Derm: Negative for rash or itching.  Neuro: Negative for weakness, abnormal sensation, seizure, frequent headaches, memory loss, confusion.  Psych: Negative for anxiety, depression, suicidal ideation, hallucinations.  Endo: Negative for unusual weight change.  Heme: Negative for bruising or bleeding. Allergy: Negative for rash or hives.  Physical Exam: Vital signs in last 24 hours: Temp:  [98 F (36.7 C)-98.6 F (37 C)] 98 F (36.7 C) (12/31 2200) Pulse Rate:  [76-83] 83 (12/31 2200) Resp:  [18-21] 20 (12/31 2200) BP: (125-158)/(51-73) 131/65 (12/31 2200) SpO2:  [100 %] 100 % (12/31 2200) Weight:  [112.5 kg-113 kg] 112.5 kg (01/01 0456) Last BM Date: 05/25/20 General:   Alert,  Well-developed, well-nourished, pleasant and cooperative in NAD, Obese Head:  Normocephalic and atraumatic. Eyes:  Sclera clear, no icterus.   Conjunctiva pink. Ears:  Normal auditory acuity. Nose:  No deformity, discharge,  or lesions. Mouth:  No deformity or lesions, dentition normal. Neck:  Supple; no masses or thyromegaly. Lungs:  Clear throughout to auscultation.   No wheezes, crackles, or rhonchi. No acute distress. Heart:  Regular rate and rhythm; no murmurs, clicks, rubs,  or gallops. Abdomen:  Soft, mildly tender to deep palpation, nondistended. No masses, hepatosplenomegaly or hernias noted. Normal bowel sounds, without guarding, and without  rebound.   Extremities:  BL LE Edema Neurologic:  Alert and  oriented x4;  grossly normal neurologically. Cervical Nodes:  No significant cervical adenopathy. Psych:  Alert and cooperative. Normal mood and affect.  Intake/Output from previous day: 12/31 0701 - 01/01 0700 In: 144.5 [IV Piggyback:144.5] Out: 2000 [Urine:2000] Intake/Output this shift: No intake/output data recorded.  Lab Results: Recent Labs    05/24/20 1630 05/25/20 0501 05/26/20 0643  WBC 6.6 6.5 6.0  HGB 11.4* 10.6* 10.8*  HCT 36.8 35.4* 35.8*  PLT 391 334 321   BMET Recent Labs    05/24/20 1630 05/25/20 0501 05/26/20 0643  NA 136 135 134*  K 3.8 4.3 3.8  CL 99 99 98  CO2 22 21* 28  GLUCOSE 413* 487* 288*  BUN 21  24* 23  CREATININE 1.41* 1.58* 1.48*  CALCIUM 9.0 8.6* 8.7*   LFT Recent Labs    05/24/20 1630 05/25/20 0501 05/26/20 0643  PROT 6.5 6.0* 5.7*  ALBUMIN 2.9* 2.7* 2.6*  AST 11* 10* 9*  ALT 11 11 11   ALKPHOS 104 96 92  BILITOT 0.9 1.0 0.5   PT/INR Recent Labs    05/25/20 0501  LABPROT 13.0  INR 1.0   Hepatitis Panel No results for input(s): HEPBSAG, HCVAB, HEPAIGM, HEPBIGM in the last 72 hours. C-Diff Recent Labs    05/25/20 1531  CDIFFTOX NEGATIVE    Studies/Results: DG Chest Portable 1 View  Result Date: 05/24/2020 CLINICAL DATA:  Cough, short of breath, nausea and vomiting, diarrhea for 2 weeks EXAM: PORTABLE CHEST 1 VIEW COMPARISON:  05/17/2020 FINDINGS: Single frontal view of the chest demonstrates an unremarkable cardiac silhouette. Interstitial and ground-glass opacities throughout the lungs are again noted, with no significant change since prior study. No effusion or pneumothorax. No acute bony abnormalities. IMPRESSION: 1. Stable bilateral interstitial and ground-glass opacities, which could reflect edema or multifocal pneumonia. Electronically Signed   By: Randa Ngo M.D.   On: 05/24/2020 18:33    Impression: *Intractable nausea and  vomiting-worsening *Abdominal pain *Diarrhea *Uncontrolled type 2 diabetes *Chronic GERD  Plan: Etiology of patient's intractable nausea and vomiting unclear.  She certainly has risk factors for gastroparesis given her uncontrolled diabetes.  Other differential includes  peptic ulcer disease, esophagitis, gastritis, H. Pylori, duodenitis, esophageal stricture, Schatzki's ring, esophageal web or other.   We will plan on further evaluation with upper endoscopy tomorrow morning.  The risks including infection, bleed, or perforation as well as benefits, limitations, alternatives and imponderables have been reviewed with the patient. Potential for esophageal dilation, biopsy, etc. have also been reviewed.  Questions have been answered. All parties agreeable.   Discussed case with hospitalist.  Continue on antiemetics.  We will recheck EKG to evaluate QT interval as there seems to be some discrepancy between the QT and QTc.  If not prolonged, we can consider adding IV metoclopramide while inpatient.  Continue IV fluids until able to tolerate diet.  Continue on PPI therapy.  Further recommendations to follow.  GI infectious panel pending.  Thank you for letting us take part in this patient's care.  Elon Alas. Abbey Chatters, D.O. Gastroenterology and Hepatology [facility]    LOS: 2 days     05/26/2020, 1:02 PM

## 2020-05-26 NOTE — Progress Notes (Signed)
PROGRESS NOTE    Susan [name]  XFG:[id number] DOB: [birth year] DOA: 05/24/2020 PCP: Alycia Rossetti, MD    Brief Narrative:  Patient is 70 year old female with history of chronic diastolic congestive heart failure, hypertension, type 2 diabetes on insulin pump, stage IV chronic kidney disease, peripheral vascular disease and chronic neuropathy presented to the ER with ongoing nausea vomiting and diarrhea for almost 4 weeks now.  Patient was admitted to hospital with same symptoms on 12/23-12/24.  Suspected with viral gastroenteritis and discharged home.  Patient had ongoing nausea and unable to tolerate oral medications.  Also has intermittent cough and shortness of breath as well as bilateral leg swelling so came to the hospital. In the emergency room, initially tachycardic and tachypneic.  Blood pressures elevated.  Renal functions within normal range.  Hyperglycemic.  Troponin 10-84.  BNP 886.  Chest x-ray with bilateral interstitial and groundglass opacities.  She was given dose of aspirin.  IV Lasix 20 mg x 1.  Insulin.  Admitted with ongoing nausea vomiting and intolerance to diet.  Assessment & Plan:   Principal Problem:   Nausea vomiting and diarrhea Active Problems:   Essential hypertension   Hyperglycemia due to diabetes mellitus (HCC)   Elevated troponin   Prolonged QT interval   Obesity, Class III, BMI 40-49.9 (morbid obesity) (HCC)   Acute exacerbation of CHF (congestive heart failure) (HCC)   Chronic kidney disease (CKD)   Hypoalbuminemia   GERD (gastroesophageal reflux disease)  Nausea, vomiting and diarrhea: Chronic and recurrent symptoms.  Primary cause unknown. CT scan abdomen pelvis with contrast on 12/23 with no evidence of intra-abdominal process. Continues to be symptomatic.  Vomited overnight.   Continue liquid diet.   Called and discussed with gastroenterology, plan for upper GI endoscopy tomorrow.   Possible gastro paresis.  Recheck EKG and use metoclopramide  if QTC is less than 450.   C. difficile is negative.   GI pathogen panel pending.   Presumed acute exacerbation of diastolic heart failure: Recent echocardiogram with normal ejection fraction.  BNP 886.  Given IV Lasix x1.  Currently euvolemic.  Will resume her home doses of torsemide.  Avoid IV fluids to avoid fluid overload.  Type 2 diabetes with hyperglycemia: Uncontrolled. Patient on insulin pump at home.  Resume long-acting insulin and high-dose sliding scale insulin.  We will continue to monitor.  She will be n.p.o. past midnight.  Continue current doses of insulin.  GERD: On PPI.  Acute UTI, present on admission.  Diagnosed with pansensitive E. coli on 12/23.  still symptomatic.  Only able to take 2 doses of Keflex.  Will treat with Rocephin while in the hospital for 5 days.  Peripheral vascular disease: Patient on aspirin and Plavix.  On Protonix for GI protection.  Continue.   DVT prophylaxis: heparin injection 5,000 Units Start: 05/24/20 2230 SCDs Start: 05/24/20 2218   Code Status: Full code Family Communication: None.  Patient will communicate with her daughter. Disposition Plan: Status is: Inpatient  Remains inpatient appropriate because:Inpatient level of care appropriate due to severity of illness   Dispo: The patient is from: Home              Anticipated d/c is to: Home              Anticipated d/c date is: 2 days              Patient currently is not medically stable to d/c.  Consultants:   Gastroenterology  Procedures:   None  Antimicrobials:   Rocephin, 12/30---   Subjective: Patient seen and examined.  2 loose stool over last 24 hours.  She did not like to eat dinner last night.  She woke up and vomited once in the middle of the night.  Feels sick.  Objective: Vitals:   05/25/20 1655 05/25/20 1716 05/25/20 2200 05/26/20 0456  BP: (!) 158/73  131/65   Pulse: 76  83   Resp: 18  20   Temp:   98 F (36.7 C)   TempSrc:   Oral    SpO2: 100%  100%   Weight:  113 kg  112.5 kg  Height:  5\' 6"  (1.676 m)     No intake or output data in the 24 hours ending 05/26/20 1339 Filed Weights   05/25/20 1716 05/26/20 0456  Weight: 113 kg 112.5 kg    Examination:  General exam: Appears calm and comfortable  Anxious. Respiratory system: Clear to auscultation. Respiratory effort normal.  No added sounds. Cardiovascular system: S1 & S2 heard, RRR. No pedal edema. Gastrointestinal system: Abdomen is nondistended, soft and nontender. No organomegaly or masses felt. Normal bowel sounds heard. Central nervous system: Alert and oriented. No focal neurological deficits. Extremities: Symmetric 5 x 5 power. Skin: No rashes, lesions or ulcers Psychiatry: Judgement and insight appear normal. Mood & affect anxious.    Data Reviewed: I have personally reviewed following labs and imaging studies  CBC: Recent Labs  Lab 05/24/20 1630 05/25/20 0501 05/26/20 0643  WBC 6.6 6.5 6.0  NEUTROABS  --   --  4.0  HGB 11.4* 10.6* 10.8*  HCT 36.8 35.4* 35.8*  MCV 83.1 86.3 85.6  PLT 391 334 025   Basic Metabolic Panel: Recent Labs  Lab 05/24/20 1630 05/25/20 0501 05/26/20 0643  NA 136 135 134*  K 3.8 4.3 3.8  CL 99 99 98  CO2 22 21* 28  GLUCOSE 413* 487* 288*  BUN 21 24* 23  CREATININE 1.41* 1.58* 1.48*  CALCIUM 9.0 8.6* 8.7*  MG  --  1.8 2.0  PHOS  --  4.2 3.6   GFR: Estimated Creatinine Clearance: 45 mL/min (A) (by C-G formula based on SCr of 1.48 mg/dL (H)). Liver Function Tests: Recent Labs  Lab 05/24/20 1630 05/25/20 0501 05/26/20 0643  AST 11* 10* 9*  ALT 11 11 11   ALKPHOS 104 96 92  BILITOT 0.9 1.0 0.5  PROT 6.5 6.0* 5.7*  ALBUMIN 2.9* 2.7* 2.6*   Recent Labs  Lab 05/24/20 1630  LIPASE 16   No results for input(s): AMMONIA in the last 168 hours. Coagulation Profile: Recent Labs  Lab 05/25/20 0501  INR 1.0   Cardiac Enzymes: No results for input(s): CKTOTAL, CKMB, CKMBINDEX, TROPONINI in the last  168 hours. BNP (last 3 results) No results for input(s): PROBNP in the last 8760 hours. HbA1C: No results for input(s): HGBA1C in the last 72 hours. CBG: Recent Labs  Lab 05/25/20 1507 05/25/20 1729 05/25/20 2130 05/26/20 0734 05/26/20 1123  GLUCAP 456* 253* 193* 321* 298*   Lipid Profile: No results for input(s): CHOL, HDL, LDLCALC, TRIG, CHOLHDL, LDLDIRECT in the last 72 hours. Thyroid Function Tests: No results for input(s): TSH, T4TOTAL, FREET4, T3FREE, THYROIDAB in the last 72 hours. Anemia Panel: No results for input(s): VITAMINB12, FOLATE, FERRITIN, TIBC, IRON, RETICCTPCT in the last 72 hours. Sepsis Labs: No results for input(s): PROCALCITON, LATICACIDVEN in the last 168 hours.  Recent Results (from the  past 240 hour(s))  Resp Panel by RT-PCR (Flu A&B, Covid) Nasopharyngeal Swab     Status: None   Collection Time: 05/17/20  8:08 AM   Specimen: Nasopharyngeal Swab; Nasopharyngeal(NP) swabs in vial transport medium  Result Value Ref Range Status   SARS Coronavirus 2 by RT PCR NEGATIVE NEGATIVE Final    Comment: (NOTE) SARS-CoV-2 target nucleic acids are NOT DETECTED.  The SARS-CoV-2 RNA is generally detectable in upper respiratory specimens during the acute phase of infection. The lowest concentration of SARS-CoV-2 viral copies this assay can detect is 138 copies/mL. A negative result does not preclude SARS-Cov-2 infection and should not be used as the sole basis for treatment or other patient management decisions. A negative result may occur with  improper specimen collection/handling, submission of specimen other than nasopharyngeal swab, presence of viral mutation(s) within the areas targeted by this assay, and inadequate number of viral copies(<138 copies/mL). A negative result must be combined with clinical observations, patient history, and epidemiological information. The expected result is Negative.  Fact Sheet for Patients:   EntrepreneurPulse.com.au  Fact Sheet for Healthcare Providers:  IncredibleEmployment.be  This test is no t yet approved or cleared by the [place] FDA and  has been authorized for detection and/or diagnosis of SARS-CoV-2 by FDA under an Emergency Use Authorization (EUA). This EUA will remain  in effect (meaning this test can be used) for the duration of the COVID-19 declaration under Section 564(b)(1) of the Act, 21 U.S.C.section 360bbb-3(b)(1), unless the authorization is terminated  or revoked sooner.       Influenza A by PCR NEGATIVE NEGATIVE Final   Influenza B by PCR NEGATIVE NEGATIVE Final    Comment: (NOTE) The Xpert Xpress SARS-CoV-2/FLU/RSV plus assay is intended as an aid in the diagnosis of influenza from Nasopharyngeal swab specimens and should not be used as a sole basis for treatment. Nasal washings and aspirates are unacceptable for Xpert Xpress SARS-CoV-2/FLU/RSV testing.  Fact Sheet for Patients: EntrepreneurPulse.com.au  Fact Sheet for Healthcare Providers: IncredibleEmployment.be  This test is not yet approved or cleared by the [place] FDA and has been authorized for detection and/or diagnosis of SARS-CoV-2 by FDA under an Emergency Use Authorization (EUA). This EUA will remain in effect (meaning this test can be used) for the duration of the COVID-19 declaration under Section 564(b)(1) of the Act, 21 U.S.C. section 360bbb-3(b)(1), unless the authorization is terminated or revoked.  Performed at [facility], [address] [place], [place] [address]   Blood culture (routine x 2)     Status: None   Collection Time: 05/17/20  9:36 AM   Specimen: BLOOD  Result Value Ref Range Status   Specimen Description BLOOD BLOOD LEFT ARM  Final   Special Requests   Final    BOTTLES DRAWN AEROBIC AND ANAEROBIC Blood Culture adequate volume   Culture   Final    NO GROWTH 5  DAYS Performed at [facility], [address], [place], [place] [address]    Report Status 05/22/2020 FINAL  Final  Blood culture (routine x 2)     Status: None   Collection Time: 05/17/20  9:41 AM   Specimen: BLOOD  Result Value Ref Range Status   Specimen Description BLOOD RIGHT ANTECUBITAL  Final   Special Requests   Final    BOTTLES DRAWN AEROBIC AND ANAEROBIC Blood Culture results may not be optimal due to an inadequate volume of blood received in culture bottles   Culture   Final  NO GROWTH 5 DAYS Performed at [facility], [address] [place], [place] [address]    Report Status 05/22/2020 FINAL  Final  Urine Culture     Status: Abnormal   Collection Time: 05/17/20 11:28 AM   Specimen: Urine, Random  Result Value Ref Range Status   Specimen Description   Final    URINE, RANDOM Performed at [facility], [address], [place], [name] [address]    Special Requests   Final    NONE Performed at [facility], [address] [place], [place] [address]    Culture >=100,000 COLONIES/mL ESCHERICHIA COLI (A)  Final   Report Status 05/19/2020 FINAL  Final   Organism ID, Bacteria ESCHERICHIA COLI (A)  Final      Susceptibility   Escherichia coli - MIC*    AMPICILLIN <=2 SENSITIVE Sensitive     CEFAZOLIN <=4 SENSITIVE Sensitive     CEFEPIME <=0.12 SENSITIVE Sensitive     CEFTRIAXONE <=0.25 SENSITIVE Sensitive     CIPROFLOXACIN <=0.25 SENSITIVE Sensitive     GENTAMICIN <=1 SENSITIVE Sensitive     IMIPENEM <=0.25 SENSITIVE Sensitive     NITROFURANTOIN <=16 SENSITIVE Sensitive     TRIMETH/SULFA <=20 SENSITIVE Sensitive     AMPICILLIN/SULBACTAM <=2 SENSITIVE Sensitive     PIP/TAZO <=4 SENSITIVE Sensitive     * >=100,000 COLONIES/mL ESCHERICHIA COLI  Resp Panel by RT-PCR (Flu A&B, Covid) Nasopharyngeal Swab     Status: None   Collection Time: 05/25/20 12:16 AM   Specimen: Nasopharyngeal Swab; Nasopharyngeal(NP) swabs in vial transport medium  Result Value Ref Range Status    SARS Coronavirus 2 by RT PCR NEGATIVE NEGATIVE Final    Comment: (NOTE) SARS-CoV-2 target nucleic acids are NOT DETECTED.  The SARS-CoV-2 RNA is generally detectable in upper respiratory specimens during the acute phase of infection. The lowest concentration of SARS-CoV-2 viral copies this assay can detect is 138 copies/mL. A negative result does not preclude SARS-Cov-2 infection and should not be used as the sole basis for treatment or other patient management decisions. A negative result may occur with  improper specimen collection/handling, submission of specimen other than nasopharyngeal swab, presence of viral mutation(s) within the areas targeted by this assay, and inadequate number of viral copies(<138 copies/mL). A negative result must be combined with clinical observations, patient history, and epidemiological information. The expected result is Negative.  Fact Sheet for Patients:  EntrepreneurPulse.com.au  Fact Sheet for Healthcare Providers:  IncredibleEmployment.be  This test is no t yet approved or cleared by the [place] FDA and  has been authorized for detection and/or diagnosis of SARS-CoV-2 by FDA under an Emergency Use Authorization (EUA). This EUA will remain  in effect (meaning this test can be used) for the duration of the COVID-19 declaration under Section 564(b)(1) of the Act, 21 U.S.C.section 360bbb-3(b)(1), unless the authorization is terminated  or revoked sooner.       Influenza A by PCR NEGATIVE NEGATIVE Final   Influenza B by PCR NEGATIVE NEGATIVE Final    Comment: (NOTE) The Xpert Xpress SARS-CoV-2/FLU/RSV plus assay is intended as an aid in the diagnosis of influenza from Nasopharyngeal swab specimens and should not be used as a sole basis for treatment. Nasal washings and aspirates are unacceptable for Xpert Xpress SARS-CoV-2/FLU/RSV testing.  Fact Sheet for  Patients: EntrepreneurPulse.com.au  Fact Sheet for Healthcare Providers: IncredibleEmployment.be  This test is not yet approved or cleared by the [place] FDA and has been authorized for detection and/or diagnosis of  SARS-CoV-2 by FDA under an Emergency Use Authorization (EUA). This EUA will remain in effect (meaning this test can be used) for the duration of the COVID-19 declaration under Section 564(b)(1) of the Act, 21 U.S.C. section 360bbb-3(b)(1), unless the authorization is terminated or revoked.  Performed at [facility], [address], [place], [place] [address]   C Difficile Quick Screen w PCR reflex     Status: None   Collection Time: 05/25/20  3:31 PM   Specimen: Stool  Result Value Ref Range Status   C Diff antigen NEGATIVE NEGATIVE Final   C Diff toxin NEGATIVE NEGATIVE Final   C Diff interpretation No C. difficile detected.  Final    Comment: Performed at [facility], [address] [place], [place] [address]         Radiology Studies: DG Chest Portable 1 View  Result Date: 05/24/2020 CLINICAL DATA:  Cough, short of breath, nausea and vomiting, diarrhea for 2 weeks EXAM: PORTABLE CHEST 1 VIEW COMPARISON:  05/17/2020 FINDINGS: Single frontal view of the chest demonstrates an unremarkable cardiac silhouette. Interstitial and ground-glass opacities throughout the lungs are again noted, with no significant change since prior study. No effusion or pneumothorax. No acute bony abnormalities. IMPRESSION: 1. Stable bilateral interstitial and ground-glass opacities, which could reflect edema or multifocal pneumonia. Electronically Signed   By: Randa Ngo M.D.   On: 05/24/2020 18:33        Scheduled Meds: . allopurinol  100 mg Oral BID  . vitamin C  1,000 mg Oral Daily  . aspirin EC  81 mg Oral q morning - 10a  . carvedilol  12.5 mg Oral BID WC  . cholecalciferol  1,000 Units Oral Q supper  . clopidogrel  75 mg Oral Q  breakfast  . feeding supplement (GLUCERNA SHAKE)  237 mL Oral TID BM  . ferrous sulfate  325 mg Oral BID WC  . heparin  5,000 Units Subcutaneous Q8H  . insulin aspart  0-20 Units Subcutaneous TID WC  . insulin aspart  0-5 Units Subcutaneous QHS  . insulin glargine  20 Units Subcutaneous Daily  . levothyroxine  50 mcg Oral Q0600  . magnesium oxide  400 mg Oral Daily  . [START ON 05/28/2020] metolazone  2.5 mg Oral Once per day on Mon Fri  . pantoprazole  40 mg Oral Daily  . pregabalin  150 mg Oral BID  . torsemide  60 mg Oral BID  . traZODone  100 mg Oral QHS  . ursodiol  600 mg Oral TID  . vitamin E  1,000 Units Oral Q supper   Continuous Infusions: . cefTRIAXone (ROCEPHIN)  IV 1 g (05/26/20 1108)     LOS: 2 days    Time spent: 30 minutes    Barb Merino, MD [facility] Pager [phone]

## 2020-05-26 NOTE — H&P (View-Only) (Signed)
Consulting  Provider:  Primary Care Physician:  Alycia Rossetti, MD Primary Gastroenterologist:  Dr. Collene Mares  Reason for Consultation:  Intractable Nausea and vomiting  HPI:  Susan [name] is a 70 y.o. female with a past medical history of diabetes, stage IV CKD, vascular disease, who presented to [facility] ER 05/24/2020 with intractable nausea and vomiting.  Patient had a recent admission to our local facility 05/17/2020 as well.  She responded well to conservative measures with IV fluids and antiemetics and was discharged home the next day.  She states she has been "sick" for the last 4 weeks with this.  Notes sudden onset nausea and vomiting at that time.  She states the last 7 to 10 days she has had watery diarrhea as well.  Does note some abdominal discomfort starting yesterday, midepigastric.  Does not radiate.  Patient has been unable to tolerate clear liquids during this admission.  She does have chronic diabetes which is not under control.  CT abdomen pelvis 05/17/2020 which I personally reviewed showed mild intrahepatic ductal dilatation, cholelithiasis, small bilateral effusions, otherwise unremarkable.  Last EGD October 2020 showed large gastric polyp which was removed with hot snare.  She was admitted to the hospital with GI bleeding at that time.  Pathology positive for hyperplastic polyp with GIM.  Colonoscopy unrevealing besides 3-4 small tubular adenomas.  Appear that she had a liver biopsy in March 2021 due to elevated LFTs which showed signs of drug-induced hepatitis.  She follows with Dr. Collene Mares for her gastroenterological issues.  Past Medical History:  Diagnosis Date  . Acute MI ([place]) 1999; 2007  . Anemia    hx  . Anginal pain ([place])   . Anxiety   . ARF (acute renal failure) ([place]) 06/2017   [facility]  . Arthritis    "generalized" (03/15/2014)  . CAD (coronary artery disease)    MI in 2000 - MI  2007 - treated bare metal stent (no nuclear since then as 9/11)   . Carotid artery disease ([place])   . CHF (congestive heart failure) ([place])   . Chronic diastolic heart failure (HCC)    a) ECHO (08/2013) EF 55-60% and RV function nl b) RHC (08/2013) RA 4, RV 30/5/7, PA 25/10 (16), PCWP 7, Fick CO/CI 6.3/2.7, PVR 1.5 WU, PA 61 and 66%  . Daily headache    "~ every other day; since I fell in June" (03/15/2014)  . Depression   . Dyslipidemia   . Dyspnea   . Exertional shortness of breath   . HTN (hypertension)   . Hypothyroidism   . Neuropathy   . Obesity   . Osteoarthritis   . Peripheral neuropathy   . PONV (postoperative nausea and vomiting)   . RBBB (right bundle branch block)    Old  . Stroke [facility])    mini strokes  . Syncope    likely due to low blood sugar  . Tachycardia    Sinus tachycardia  . Type II diabetes mellitus (HCC)    Type II  . Urinary incontinence   . Venous insufficiency     Past Surgical History:  Procedure Laterality Date  . ABDOMINAL HYSTERECTOMY  1980's  . AMPUTATION Right 02/24/2018   Procedure: RIGHT FOOT GREAT TOE AND 2ND TOE AMPUTATION;  Surgeon: Newt Minion, MD;  Location: [place];  Service: Orthopedics;  Laterality: Right;  . AMPUTATION Right 04/30/2018   Procedure: RIGHT TRANSMETATARSAL AMPUTATION;  Surgeon: Newt Minion, MD;  Location: [place];  Service: Orthopedics;  Laterality: Right;  . CATARACT EXTRACTION, BILATERAL Bilateral ?2013  . COLONOSCOPY W/ POLYPECTOMY    . COLONOSCOPY WITH PROPOFOL N/A 03/13/2019   Procedure: COLONOSCOPY WITH PROPOFOL;  Surgeon: Jerene Bears, MD;  Location: [place];  Service: Gastroenterology;  Laterality: N/A;  . [place]; 2007   "1 + 1"  . ESOPHAGOGASTRODUODENOSCOPY (EGD) WITH PROPOFOL N/A 03/13/2019   Procedure: ESOPHAGOGASTRODUODENOSCOPY (EGD) WITH PROPOFOL;  Surgeon: Jerene Bears, MD;  Location: [facility] ENDOSCOPY;  Service: Gastroenterology;  Laterality: N/A;  . EYE SURGERY Bilateral    lazer  . HEMOSTASIS CLIP PLACEMENT  03/13/2019    Procedure: HEMOSTASIS CLIP PLACEMENT;  Surgeon: Jerene Bears, MD;  Location: [facility] ENDOSCOPY;  Service: Gastroenterology;;  . KNEE ARTHROSCOPY Left 10/25/2006  . POLYPECTOMY  03/13/2019   Procedure: POLYPECTOMY;  Surgeon: Jerene Bears, MD;  Location: [place];  Service: Gastroenterology;;  . RIGHT HEART CATH N/A 07/24/2017   Procedure: RIGHT HEART CATH;  Surgeon: Jolaine Artist, MD;  Location: [place] CV LAB;  Service: Cardiovascular;  Laterality: N/A;  . RIGHT HEART CATHETERIZATION N/A 09/22/2013   Procedure: RIGHT HEART CATH;  Surgeon: Jolaine Artist, MD;  Location: [facility] C[facility] LAB;  Service: Cardiovascular;  Laterality: N/A;  . SHOULDER ARTHROSCOPY WITH OPEN ROTATOR CUFF REPAIR Right 03/14/2014   Procedure: RIGHT SHOULDER ARTHROSCOPY WITH BICEPS RELEASE, OPEN SUBSCAPULA REPAIR, OPEN SUPRASPINATUS REPAIR.;  Surgeon: Meredith Pel, MD;  Location: [place];  Service: Orthopedics;  Laterality: Right;  . TOE AMPUTATION Right 02/24/2018   GREAT TOE AND 2ND TOE AMPUTATION  . TUBAL LIGATION  1970's    Prior to Admission medications   Medication Sig Start Date End Date Taking? Authorizing Provider  albuterol (VENTOLIN HFA) 108 (90 Base) MCG/ACT inhaler TAKE 2 PUFFS BY MOUTH EVERY 6 HOURS AS NEEDED FOR WHEEZE OR SHORTNESS OF BREATH Patient taking differently: Inhale 2 puffs into the lungs every 6 (six) hours as needed for wheezing or shortness of breath. 06/27/19   [place], [name], MD  allopurinol (ZYLOPRIM) 100 MG tablet TAKE 1 TABLET BY MOUTH TWICE A DAY Patient taking differently: Take 100 mg by mouth 2 (two) times daily. 08/02/19   Newt Minion, MD  Ascorbic Acid (VITAMIN C) 1000 MG tablet Take 1,000 mg by mouth daily.    [provider]  aspirin EC 81 MG tablet Take 81 mg by mouth every morning.     [provider]  carvedilol (COREG) 12.5 MG tablet Take 1 tablet (12.5 mg total) by mouth 2 (two) times daily with a meal. 02/13/20   [place], [name], MD  cephALEXin  (KEFLEX) 250 MG capsule Take 1 capsule (250 mg total) by mouth 4 (four) times daily for 5 days. 05/21/20 05/26/20  Alycia Rossetti, MD  cholecalciferol (VITAMIN D) 1000 units tablet Take 1,000 Units by mouth daily with supper.     [provider]  clopidogrel (PLAVIX) 75 MG tablet Take 1 tablet (75 mg total) by mouth daily with breakfast. 05/23/20   Clegg, Amy D, NP  ferrous sulfate 325 (65 FE) MG tablet Take 1 tablet (325 mg total) by mouth 2 (two) times daily with a meal. Reported on 08/21/2015 06/07/18   Alycia Rossetti, MD  guaiFENesin-dextromethorphan [facility] DM) 100-10 MG/5ML syrup Take 10 mLs by mouth every 4 (four) hours as needed for cough. 03/13/20   [place] Donnamarie Poag, DO  HYDROcodone-acetaminophen (NORCO) 5-325 MG tablet Take 1 tablet by mouth every  8 (eight) hours as needed for moderate pain. Patient not taking: No sig reported 04/04/20   Alycia Rossetti, MD  Insulin Disposable Pump (OMNIPOD DASH 5 PACK PODS) MISC Inject into the skin See admin instructions. Use continuously with Novolin R - change every 72 hours. 11/18/18   [provider]  insulin NPH Human (HUMULIN N,NOVOLIN N) 100 UNIT/ML injection Inject 10-15 Units into the skin See admin instructions. Inject 15 units subcutaneously in the morning if CBG >300; inject 10 units 200-300    [provider]  insulin regular (NOVOLIN R) 100 units/mL injection Inject into the skin See admin instructions. Manually add bolus to continuous dose via OmniPod 3 times daily per sliding scale (CBG 80-150 7 units, 151-200 9 units, 201-250 12 units, 251-300 14 units, 301- 400 17 units)    [provider]  levothyroxine (SYNTHROID, LEVOTHROID) 50 MCG tablet Take 1 tablet (50 mcg total) by mouth daily before breakfast. 11/07/16   Dena Billet B, PA-C  magnesium oxide (MAG-OX) 400 MG tablet TAKE 1 TABLET BY MOUTH EVERY DAY Patient taking differently: Take 400 mg by mouth daily. 09/13/19   [place], [name], MD   metolazone (ZAROXOLYN) 2.5 MG tablet Take 2.5 mg by mouth See admin instructions. Take 1 tablet by mouth on Monday and Fridays    [provider]  nitroGLYCERIN (NITROSTAT) 0.4 MG SL tablet Place 1 tablet (0.4 mg total) under the tongue every 5 (five) minutes as needed for chest pain. Patient taking differently: Place 0.4 mg under the tongue every 5 (five) minutes as needed for chest pain (max 3 doses). 04/28/19   Clegg, Amy D, NP  ondansetron (ZOFRAN-ODT) 8 MG disintegrating tablet Take 1 tablet (8 mg total) by mouth 3 (three) times daily. Patient taking differently: Take 8 mg by mouth 3 (three) times daily as needed for nausea or vomiting. 02/27/20   [place], [name], MD  pantoprazole (PROTONIX) 40 MG tablet TAKE 1 TABLET BY MOUTH EVERY DAY Patient taking differently: Take 40 mg by mouth daily. 01/04/20   [place], [name], MD  pregabalin (LYRICA) 150 MG capsule Take 1 capsule (150 mg total) by mouth 2 (two) times daily. 11/12/16   Alycia Rossetti, MD  torsemide (DEMADEX) 20 MG tablet Take 2 tablets (40 mg total) by mouth 2 (two) times daily. Patient taking differently: Take 60 mg by mouth 2 (two) times daily. 02/10/20   [place], Marlowe Aschoff, MD  traZODone (DESYREL) 100 MG tablet TAKE 1 AND 1/2 TABLETS BY MOUTH AT BEDTIME AS NEEDED FOR SLEEP. Patient taking differently: Take 100 mg by mouth at bedtime. 01/26/20   Alycia Rossetti, MD  ursodiol (ACTIGALL) 500 MG tablet Take 500 mg by mouth 3 (three) times daily. 09/09/19   [provider]  vitamin E 1000 UNIT capsule Take 1,000 Units by mouth daily with supper.     [provider]    Current Facility-Administered Medications  Medication Dose Route Frequency Provider Last Rate Last Admin  . albuterol (VENTOLIN HFA) 108 (90 Base) MCG/ACT inhaler 2 puff  2 puff Inhalation Q6H PRN Barb Merino, MD      . allopurinol (ZYLOPRIM) tablet 100 mg  100 mg Oral BID Barb Merino, MD   100 mg at 05/26/20 0843  . ascorbic acid (VITAMIN C)  tablet 1,000 mg  1,000 mg Oral Daily Barb Merino, MD   1,000 mg at 05/26/20 0842  . aspirin EC tablet 81 mg  81 mg Oral q morning - 10a Ghimire, Dante Gang,  MD   81 mg at 05/26/20 0844  . carvedilol (COREG) tablet 12.5 mg  12.5 mg Oral BID WC Barb Merino, MD   12.5 mg at 05/26/20 0843  . cefTRIAXone (ROCEPHIN) 1 g in sodium chloride 0.9 % 100 mL IVPB  1 g Intravenous Q24H Barb Merino, MD 200 mL/hr at 05/26/20 1108 1 g at 05/26/20 1108  . cholecalciferol (VITAMIN D3) tablet 1,000 Units  1,000 Units Oral Q supper Barb Merino, MD      . clopidogrel (PLAVIX) tablet 75 mg  75 mg Oral Q breakfast Barb Merino, MD   75 mg at 05/26/20 0843  . feeding supplement (GLUCERNA SHAKE) (GLUCERNA SHAKE) liquid 237 mL  237 mL Oral TID BM Adefeso, Oladapo, DO   237 mL at 05/25/20 2201  . ferrous sulfate tablet 325 mg  325 mg Oral BID WC Barb Merino, MD   325 mg at 05/26/20 0843  . heparin injection 5,000 Units  5,000 Units Subcutaneous Q8H Adefeso, Oladapo, DO   5,000 Units at 05/26/20 0450  . insulin aspart (novoLOG) injection 0-20 Units  0-20 Units Subcutaneous TID WC Adefeso, Oladapo, DO   11 Units at 05/26/20 1206  . insulin aspart (novoLOG) injection 0-5 Units  0-5 Units Subcutaneous QHS Adefeso, Oladapo, DO      . insulin glargine (LANTUS) injection 20 Units  20 Units Subcutaneous Daily Barb Merino, MD   20 Units at 05/26/20 0845  . levothyroxine (SYNTHROID) tablet 50 mcg  50 mcg Oral Q0600 Barb Merino, MD   50 mcg at 05/26/20 0450  . magnesium oxide (MAG-OX) tablet 400 mg  400 mg Oral Daily Barb Merino, MD   400 mg at 05/26/20 0843  . [START ON 05/28/2020] metolazone (ZAROXOLYN) tablet 2.5 mg  2.5 mg Oral Once per day on Mon Fri Ghimire, Dante Gang, MD      . nitroGLYCERIN (NITROSTAT) SL tablet 0.4 mg  0.4 mg Sublingual Q5 min PRN Barb Merino, MD   0.4 mg at 05/25/20 2237  . pantoprazole (PROTONIX) EC tablet 40 mg  40 mg Oral Daily Barb Merino, MD   40 mg at 05/26/20 0844  . pregabalin  (LYRICA) capsule 150 mg  150 mg Oral BID Barb Merino, MD   150 mg at 05/26/20 0843  . torsemide (DEMADEX) tablet 60 mg  60 mg Oral BID Barb Merino, MD   60 mg at 05/26/20 0842  . traZODone (DESYREL) tablet 100 mg  100 mg Oral QHS Barb Merino, MD   100 mg at 05/25/20 2237  . ursodiol (ACTIGALL) capsule 600 mg  600 mg Oral TID Barb Merino, MD   600 mg at 05/26/20 0843  . vitamin E capsule 1,000 Units  1,000 Units Oral Q supper Barb Merino, MD        Allergies as of 05/24/2020 - Review Complete 05/24/2020  Allergen Reaction Noted  . Codeine Nausea And Vomiting     Family History  Problem Relation Age of Onset  . Heart attack Mother 69    Social History   Socioeconomic History  . Marital status: Married    Spouse name: Not on file  . Number of children: 3  . Years of education: 12th  . Highest education level: Not on file  Occupational History    Employer: UNEMPLOYED  Tobacco Use  . Smoking status: Former Smoker    Packs/day: 3.00    Years: 32.00    Pack years: 96.00    Types: Cigarettes    Quit date: 10/24/1997  Years since quitting: 22.6  . Smokeless tobacco: Never Used  Vaping Use  . Vaping Use: Never used  Substance and Sexual Activity  . Alcohol use: Not Currently    Comment: "might have 2-3 daiquiris in the summer"  . Drug use: No  . Sexual activity: Never  Other Topics Concern  . Not on file  Social History Narrative   Pt lives at home with her spouse.   Caffeine Use- 3 sodas daily   Social Determinants of Health   Financial Resource Strain: Not on file  Food Insecurity: Not on file  Transportation Needs: No Transportation Needs  . Lack of Transportation (Medical): No  . Lack of Transportation (Non-Medical): No  Physical Activity: Inactive  . Days of Exercise per Week: 0 days  . Minutes of Exercise per Session: 10 min  Stress: Not on file  Social Connections: Not on file  Intimate Partner Violence: Not on file    Review of  Systems: General: Negative for anorexia, weight loss, fever, chills, fatigue, weakness. Eyes: Negative for vision changes.  ENT: Negative for hoarseness, difficulty swallowing , nasal congestion. CV: Negative for chest pain, angina, palpitations, dyspnea on exertion, peripheral edema.  Respiratory: Negative for dyspnea at rest, dyspnea on exertion, cough, sputum, wheezing.  GI: See history of present illness. GU:  Negative for dysuria, hematuria, urinary incontinence, urinary frequency, nocturnal urination.  MS: Negative for joint pain, low back pain.  Derm: Negative for rash or itching.  Neuro: Negative for weakness, abnormal sensation, seizure, frequent headaches, memory loss, confusion.  Psych: Negative for anxiety, depression, suicidal ideation, hallucinations.  Endo: Negative for unusual weight change.  Heme: Negative for bruising or bleeding. Allergy: Negative for rash or hives.  Physical Exam: Vital signs in last 24 hours: Temp:  [98 F (36.7 C)-98.6 F (37 C)] 98 F (36.7 C) (12/31 2200) Pulse Rate:  [76-83] 83 (12/31 2200) Resp:  [18-21] 20 (12/31 2200) BP: (125-158)/(51-73) 131/65 (12/31 2200) SpO2:  [100 %] 100 % (12/31 2200) Weight:  [112.5 kg-113 kg] 112.5 kg (01/01 0456) Last BM Date: 05/25/20 General:   Alert,  Well-developed, well-nourished, pleasant and cooperative in NAD, Obese Head:  Normocephalic and atraumatic. Eyes:  Sclera clear, no icterus.   Conjunctiva pink. Ears:  Normal auditory acuity. Nose:  No deformity, discharge,  or lesions. Mouth:  No deformity or lesions, dentition normal. Neck:  Supple; no masses or thyromegaly. Lungs:  Clear throughout to auscultation.   No wheezes, crackles, or rhonchi. No acute distress. Heart:  Regular rate and rhythm; no murmurs, clicks, rubs,  or gallops. Abdomen:  Soft, mildly tender to deep palpation, nondistended. No masses, hepatosplenomegaly or hernias noted. Normal bowel sounds, without guarding, and without  rebound.   Extremities:  BL LE Edema Neurologic:  Alert and  oriented x4;  grossly normal neurologically. Cervical Nodes:  No significant cervical adenopathy. Psych:  Alert and cooperative. Normal mood and affect.  Intake/Output from previous day: 12/31 0701 - 01/01 0700 In: 144.5 [IV Piggyback:144.5] Out: 2000 [Urine:2000] Intake/Output this shift: No intake/output data recorded.  Lab Results: Recent Labs    05/24/20 1630 05/25/20 0501 05/26/20 0643  WBC 6.6 6.5 6.0  HGB 11.4* 10.6* 10.8*  HCT 36.8 35.4* 35.8*  PLT 391 334 321   BMET Recent Labs    05/24/20 1630 05/25/20 0501 05/26/20 0643  NA 136 135 134*  K 3.8 4.3 3.8  CL 99 99 98  CO2 22 21* 28  GLUCOSE 413* 487* 288*  BUN 21  24* 23  CREATININE 1.41* 1.58* 1.48*  CALCIUM 9.0 8.6* 8.7*   LFT Recent Labs    05/24/20 1630 05/25/20 0501 05/26/20 0643  PROT 6.5 6.0* 5.7*  ALBUMIN 2.9* 2.7* 2.6*  AST 11* 10* 9*  ALT 11 11 11   ALKPHOS 104 96 92  BILITOT 0.9 1.0 0.5   PT/INR Recent Labs    05/25/20 0501  LABPROT 13.0  INR 1.0   Hepatitis Panel No results for input(s): HEPBSAG, HCVAB, HEPAIGM, HEPBIGM in the last 72 hours. C-Diff Recent Labs    05/25/20 1531  CDIFFTOX NEGATIVE    Studies/Results: DG Chest Portable 1 View  Result Date: 05/24/2020 CLINICAL DATA:  Cough, short of breath, nausea and vomiting, diarrhea for 2 weeks EXAM: PORTABLE CHEST 1 VIEW COMPARISON:  05/17/2020 FINDINGS: Single frontal view of the chest demonstrates an unremarkable cardiac silhouette. Interstitial and ground-glass opacities throughout the lungs are again noted, with no significant change since prior study. No effusion or pneumothorax. No acute bony abnormalities. IMPRESSION: 1. Stable bilateral interstitial and ground-glass opacities, which could reflect edema or multifocal pneumonia. Electronically Signed   By: Randa Ngo M.D.   On: 05/24/2020 18:33    Impression: *Intractable nausea and  vomiting-worsening *Abdominal pain *Diarrhea *Uncontrolled type 2 diabetes *Chronic GERD  Plan: Etiology of patient's intractable nausea and vomiting unclear.  She certainly has risk factors for gastroparesis given her uncontrolled diabetes.  Other differential includes  peptic ulcer disease, esophagitis, gastritis, H. Pylori, duodenitis, esophageal stricture, Schatzki's ring, esophageal web or other.   We will plan on further evaluation with upper endoscopy tomorrow morning.  The risks including infection, bleed, or perforation as well as benefits, limitations, alternatives and imponderables have been reviewed with the patient. Potential for esophageal dilation, biopsy, etc. have also been reviewed.  Questions have been answered. All parties agreeable.   Discussed case with hospitalist.  Continue on antiemetics.  We will recheck EKG to evaluate QT interval as there seems to be some discrepancy between the QT and QTc.  If not prolonged, we can consider adding IV metoclopramide while inpatient.  Continue IV fluids until able to tolerate diet.  Continue on PPI therapy.  Further recommendations to follow.  GI infectious panel pending.  Thank you for letting us take part in this patient's care.  Elon Alas. Abbey Chatters, D.O. Gastroenterology and Hepatology [facility]    LOS: 2 days     05/26/2020, 1:02 PM

## 2020-05-27 ENCOUNTER — Encounter (HOSPITAL_COMMUNITY)
Admission: EM | Disposition: A | Discharge status: Home / Self Care | Payer: Self-pay | Source: Home / Self Care | Attending: Internal Medicine

## 2020-05-27 ENCOUNTER — Inpatient Hospital Stay (HOSPITAL_COMMUNITY): Payer: PPO | Admitting: Anesthesiology

## 2020-05-27 ENCOUNTER — Encounter (HOSPITAL_COMMUNITY): Payer: Self-pay | Admitting: Internal Medicine

## 2020-05-27 DIAGNOSIS — K2970 Gastritis, unspecified, without bleeding: Secondary | ICD-10-CM

## 2020-05-27 DIAGNOSIS — R112 Nausea with vomiting, unspecified: Secondary | ICD-10-CM | POA: Diagnosis not present

## 2020-05-27 DIAGNOSIS — R197 Diarrhea, unspecified: Secondary | ICD-10-CM | POA: Diagnosis not present

## 2020-05-27 HISTORY — PX: BIOPSY: SHX5522

## 2020-05-27 HISTORY — PX: ESOPHAGOGASTRODUODENOSCOPY (EGD) WITH PROPOFOL: SHX5813

## 2020-05-27 LAB — GLUCOSE, CAPILLARY
Glucose-Capillary: 256 mg/dL — ABNORMAL HIGH (ref 70–99)
Glucose-Capillary: 324 mg/dL — ABNORMAL HIGH (ref 70–99)
Glucose-Capillary: 327 mg/dL — ABNORMAL HIGH (ref 70–99)
Glucose-Capillary: 116 mg/dL — ABNORMAL HIGH (ref 70–99)

## 2020-05-27 LAB — COMPREHENSIVE METABOLIC PANEL
ALT: 10 U/L (ref 0–44)
AST: 12 U/L — ABNORMAL LOW (ref 15–41)
Albumin: 2.7 g/dL — ABNORMAL LOW (ref 3.5–5.0)
Alkaline Phosphatase: 91 U/L (ref 38–126)
Anion gap: 7 (ref 5–15)
BUN: 21 mg/dL (ref 8–23)
CO2: 24 mmol/L (ref 22–32)
Calcium: 8.1 mg/dL — ABNORMAL LOW (ref 8.9–10.3)
Chloride: 98 mmol/L (ref 98–111)
Creatinine, Ser: 1.75 mg/dL — ABNORMAL HIGH (ref 0.44–1.00)
GFR, Estimated: 31 mL/min — ABNORMAL LOW (ref >60)
Glucose, Bld: 400 mg/dL — ABNORMAL HIGH (ref 70–99)
Potassium: 3.5 mmol/L (ref 3.5–5.1)
Sodium: 129 mmol/L — ABNORMAL LOW (ref 135–145)
Total Bilirubin: 0.3 mg/dL (ref 0.3–1.2)
Total Protein: 5.8 g/dL — ABNORMAL LOW (ref 6.5–8.1)

## 2020-05-27 LAB — KOH PREP: KOH Prep: NONE SEEN

## 2020-05-27 SURGERY — ESOPHAGOGASTRODUODENOSCOPY (EGD) WITH PROPOFOL
Anesthesia: General

## 2020-05-27 MED ORDER — SODIUM CHLORIDE 0.9 % IV SOLN: INTRAVENOUS | Status: DC

## 2020-05-27 MED ORDER — PROPOFOL 10 MG/ML IV BOLUS
INTRAVENOUS | Status: DC | PRN
  Administered 2020-05-27: 200 mg via INTRAVENOUS

## 2020-05-27 MED ORDER — PROPOFOL 10 MG/ML IV BOLUS
INTRAVENOUS | Status: AC
  Filled 2020-05-27: qty 20

## 2020-05-27 MED ORDER — SODIUM CHLORIDE FLUSH 0.9 % IV SOLN
INTRAVENOUS | Status: AC
  Filled 2020-05-27: qty 10

## 2020-05-27 MED ORDER — LACTATED RINGERS IV SOLN: INTRAVENOUS | Status: DC

## 2020-05-27 MED ORDER — GERHARDT'S BUTT CREAM
TOPICAL_CREAM | Freq: Three times a day (TID) | CUTANEOUS | Status: DC
  Filled 2020-05-27: qty 1

## 2020-05-27 MED ORDER — LACTATED RINGERS IV SOLN: INTRAVENOUS | Status: DC | PRN

## 2020-05-27 NOTE — Progress Notes (Addendum)
PROGRESS NOTE    Susan [name]  OHY:[id number] DOB: 1950-10-09 DOA: 05/24/2020 PCP: Alycia Rossetti, MD    Brief Narrative:  Patient is 70 year old female with history of chronic diastolic congestive heart failure, hypertension, type 2 diabetes on insulin pump, stage IV chronic kidney disease, peripheral vascular disease and chronic neuropathy presented to the ER with ongoing nausea vomiting and diarrhea for almost 4 weeks now.  Patient was admitted to hospital with same symptoms on 12/23-12/24.  Suspected with viral gastroenteritis and discharged home.  Patient had ongoing nausea and unable to tolerate oral medications.  Also has intermittent cough and shortness of breath as well as bilateral leg swelling so came to the hospital. In the emergency room, initially tachycardic and tachypneic.  Blood pressures elevated.  Renal functions within normal range.  Hyperglycemic.  Troponin 10-84.  BNP 886.  Chest x-ray with bilateral interstitial and groundglass opacities.  She was given dose of aspirin.  IV Lasix 20 mg x 1.  Insulin.  Admitted with ongoing nausea vomiting and intolerance to diet.  Assessment & Plan:   Principal Problem:   Nausea vomiting and diarrhea Active Problems:   Essential hypertension   Hyperglycemia due to diabetes mellitus (HCC)   Elevated troponin   Prolonged QT interval   Obesity, Class III, BMI 40-49.9 (morbid obesity) (HCC)   Acute exacerbation of CHF (congestive heart failure) (HCC)   Chronic kidney disease (CKD)   Hypoalbuminemia   GERD (gastroesophageal reflux disease)  Nausea, vomiting and diarrhea: Chronic and recurrent symptoms.  --- Status post EGD on 05/27/2020 with finding of  esophagitis and gastritis ---KOH prep is negative -Cytology and biopsies pending for H. pylori and fungus  --CT scan abdomen pelvis with contrast on 12/23 with no evidence of acute intra-abdominal process. --Possible gastro paresis.  Recheck EKG and use metoclopramide if QTC is less  than 450.   C. difficile is negative.   GI pathogen panel Neg  Presumed acute exacerbation of diastolic heart failure: Recent echocardiogram with normal ejection fraction.  BNP 886.  Given IV Lasix x1.  Currently euvolemic.   -Continue home dose of torsemide 60 mg twice daily --  Type 2 diabetes with hyperglycemia: Uncontrolled. Patient on insulin pump at home.  Resume long-acting insulin and high-dose sliding scale insulin.    GERD: On PPI.  Acute UTI, present on admission.  Diagnosed with pansensitive E. coli on 12/23.  still symptomatic.  Only able to take 2 doses of Keflex.  Will treat with Rocephin while in the hospital for 3 days--last dose 05/27/2020  Peripheral vascular disease: Patient on aspirin and Plavix.    Sacral Decub----wound care consult requested--- please see photos in epic   DVT prophylaxis: heparin injection 5,000 Units Start: 05/24/20 2230 SCDs Start: 05/24/20 2218   Code Status: Full code Family Communication: None.  Patient will communicate with her daughter. Disposition Plan: Status is: Inpatient  Remains inpatient appropriate because:Awaiting tolerance of oral intake   Dispo: The patient is from: Home              Anticipated d/c is to: Home              Anticipated d/c date is: 1 day              Patient currently is not medically stable to d/c.    Consultants:   Gastroenterology  Procedures:   EGD 05/27/2020  Antimicrobials:   Rocephin, 12/30---   Subjective: -Nausea persist, no further emesis this afternoon -Awaiting  tolerance of oral intake -Had EGD 05/27/2020 --Husband at bedside, questions answered  Objective: Vitals:   05/27/20 0815 05/27/20 0830 05/27/20 0845 05/27/20 1523  BP: (!) 154/76 (!) 156/74 (!) 163/80 136/65  Pulse: 80 79 77 84  Resp: 14 11 15 16   Temp:    97.8 F (36.6 C)  TempSrc:    Oral  SpO2: 96% 98% 97% 100%  Weight:      Height:        Intake/Output Summary (Last 24 hours) at 05/27/2020 1710 Last data  filed at 05/27/2020 0731 Gross per 24 hour  Intake 450 ml  Output 600 ml  Net -150 ml   Filed Weights   05/25/20 1716 05/26/20 0456  Weight: 113 kg 112.5 kg    Examination:  General exam: Appears calm and comfortable  Anxious. Respiratory system: Clear to auscultation. Respiratory effort normal.  No added sounds. Cardiovascular system: S1 & S2 heard, RRR. No pedal edema. Gastrointestinal system: Abdomen is nondistended, soft and nontender. No organomegaly or masses felt. Normal bowel sounds heard. Central nervous system: Alert and oriented. No focal neurological deficits. Extremities: Symmetric 5 x 5 power. Skin: Sacral decubitus----please see photos in epic Psychiatry: Judgement and insight appear normal. Mood & affect anxious.    Data Reviewed: I have personally reviewed following labs and imaging studies  CBC: Recent Labs  Lab 05/24/20 1630 05/25/20 0501 05/26/20 0643  WBC 6.6 6.5 6.0  NEUTROABS  --   --  4.0  HGB 11.4* 10.6* 10.8*  HCT 36.8 35.4* 35.8*  MCV 83.1 86.3 85.6  PLT 391 334 979   Basic Metabolic Panel: Recent Labs  Lab 05/24/20 1630 05/25/20 0501 05/26/20 0643 05/27/20 1155  NA 136 135 134* 129*  K 3.8 4.3 3.8 3.5  CL 99 99 98 98  CO2 22 21* 28 24  GLUCOSE 413* 487* 288* 400*  BUN 21 24* 23 21  CREATININE 1.41* 1.58* 1.48* 1.75*  CALCIUM 9.0 8.6* 8.7* 8.1*  MG  --  1.8 2.0  --   PHOS  --  4.2 3.6  --    GFR: Estimated Creatinine Clearance: 38.1 mL/min (A) (by C-G formula based on SCr of 1.75 mg/dL (H)). Liver Function Tests: Recent Labs  Lab 05/24/20 1630 05/25/20 0501 05/26/20 0643 05/27/20 1155  AST 11* 10* 9* 12*  ALT 11 11 11 10   ALKPHOS 104 96 92 91  BILITOT 0.9 1.0 0.5 0.3  PROT 6.5 6.0* 5.7* 5.8*  ALBUMIN 2.9* 2.7* 2.6* 2.7*   Recent Labs  Lab 05/24/20 1630  LIPASE 16   No results for input(s): AMMONIA in the last 168 hours. Coagulation Profile: Recent Labs  Lab 05/25/20 0501  INR 1.0   Cardiac Enzymes: No  results for input(s): CKTOTAL, CKMB, CKMBINDEX, TROPONINI in the last 168 hours. BNP (last 3 results) No results for input(s): PROBNP in the last 8760 hours. HbA1C: No results for input(s): HGBA1C in the last 72 hours. CBG: Recent Labs  Lab 05/26/20 1616 05/26/20 2204 05/27/20 0917 05/27/20 1126 05/27/20 1630  GLUCAP 211* 234* 256* 324* 327*   Lipid Profile: No results for input(s): CHOL, HDL, LDLCALC, TRIG, CHOLHDL, LDLDIRECT in the last 72 hours. Thyroid Function Tests: No results for input(s): TSH, T4TOTAL, FREET4, T3FREE, THYROIDAB in the last 72 hours. Anemia Panel: No results for input(s): VITAMINB12, FOLATE, FERRITIN, TIBC, IRON, RETICCTPCT in the last 72 hours. Sepsis Labs: No results for input(s): PROCALCITON, LATICACIDVEN in the last 168 hours.  Recent Results (from the past  240 hour(s))  Resp Panel by RT-PCR (Flu A&B, Covid) Nasopharyngeal Swab     Status: None   Collection Time: 05/25/20 12:16 AM   Specimen: Nasopharyngeal Swab; Nasopharyngeal(NP) swabs in vial transport medium  Result Value Ref Range Status   SARS Coronavirus 2 by RT PCR NEGATIVE NEGATIVE Final    Comment: (NOTE) SARS-CoV-2 target nucleic acids are NOT DETECTED.  The SARS-CoV-2 RNA is generally detectable in upper respiratory specimens during the acute phase of infection. The lowest concentration of SARS-CoV-2 viral copies this assay can detect is 138 copies/mL. A negative result does not preclude SARS-Cov-2 infection and should not be used as the sole basis for treatment or other patient management decisions. A negative result may occur with  improper specimen collection/handling, submission of specimen other than nasopharyngeal swab, presence of viral mutation(s) within the areas targeted by this assay, and inadequate number of viral copies(<138 copies/mL). A negative result must be combined with clinical observations, patient history, and epidemiological information. The expected result is  Negative.  Fact Sheet for Patients:  EntrepreneurPulse.com.au  Fact Sheet for Healthcare Providers:  IncredibleEmployment.be  This test is no t yet approved or cleared by the [place] FDA and  has been authorized for detection and/or diagnosis of SARS-CoV-2 by FDA under an Emergency Use Authorization (EUA). This EUA will remain  in effect (meaning this test can be used) for the duration of the COVID-19 declaration under Section 564(b)(1) of the Act, 21 U.S.C.section 360bbb-3(b)(1), unless the authorization is terminated  or revoked sooner.       Influenza A by PCR NEGATIVE NEGATIVE Final   Influenza B by PCR NEGATIVE NEGATIVE Final    Comment: (NOTE) The Xpert Xpress SARS-CoV-2/FLU/RSV plus assay is intended as an aid in the diagnosis of influenza from Nasopharyngeal swab specimens and should not be used as a sole basis for treatment. Nasal washings and aspirates are unacceptable for Xpert Xpress SARS-CoV-2/FLU/RSV testing.  Fact Sheet for Patients: EntrepreneurPulse.com.au  Fact Sheet for Healthcare Providers: IncredibleEmployment.be  This test is not yet approved or cleared by the [place] FDA and has been authorized for detection and/or diagnosis of SARS-CoV-2 by FDA under an Emergency Use Authorization (EUA). This EUA will remain in effect (meaning this test can be used) for the duration of the COVID-19 declaration under Section 564(b)(1) of the Act, 21 U.S.C. section 360bbb-3(b)(1), unless the authorization is terminated or revoked.  Performed at [facility], [address] [place], [place] [address]   C Difficile Quick Screen w PCR reflex     Status: None   Collection Time: 05/25/20  3:31 PM   Specimen: Stool  Result Value Ref Range Status   C Diff antigen NEGATIVE NEGATIVE Final   C Diff toxin NEGATIVE NEGATIVE Final   C Diff interpretation No C. difficile detected.  Final     Comment: Performed at [facility], [address], [place], [place] [address]  Gastrointestinal Panel by PCR , Stool     Status: None   Collection Time: 05/25/20  3:31 PM   Specimen: Stool  Result Value Ref Range Status   Campylobacter species NOT DETECTED NOT DETECTED Final   Plesimonas shigelloides NOT DETECTED NOT DETECTED Final   Salmonella species NOT DETECTED NOT DETECTED Final   Yersinia enterocolitica NOT DETECTED NOT DETECTED Final   Vibrio species NOT DETECTED NOT DETECTED Final   Vibrio cholerae NOT DETECTED NOT DETECTED Final   Enteroaggregative E coli (EAEC) NOT DETECTED NOT DETECTED Final   Enteropathogenic E  coli (EPEC) NOT DETECTED NOT DETECTED Final   Enterotoxigenic E coli (ETEC) NOT DETECTED NOT DETECTED Final   Shiga like toxin producing E coli (STEC) NOT DETECTED NOT DETECTED Final   Shigella/Enteroinvasive E coli (EIEC) NOT DETECTED NOT DETECTED Final   Cryptosporidium NOT DETECTED NOT DETECTED Final   Cyclospora cayetanensis NOT DETECTED NOT DETECTED Final   Entamoeba histolytica NOT DETECTED NOT DETECTED Final   Giardia lamblia NOT DETECTED NOT DETECTED Final   Adenovirus F40/41 NOT DETECTED NOT DETECTED Final   Astrovirus NOT DETECTED NOT DETECTED Final   Norovirus GI/GII NOT DETECTED NOT DETECTED Final   Rotavirus A NOT DETECTED NOT DETECTED Final   Sapovirus (I, II, IV, and V) NOT DETECTED NOT DETECTED Final    Comment: Performed at [facility], [address] [place], [place] [address]  KOH prep     Status: None   Collection Time: 05/27/20  7:37 AM   Specimen: PATH GI biopsy  Result Value Ref Range Status   Specimen Description ESOPHAGUS  Final   Special Requests NONE  Final   KOH Prep   Final    NO YEAST OR FUNGAL ELEMENTS SEEN Performed at [facility], [address] [place], [place] [address]    Report Status 05/27/2020 FINAL  Final    Radiology Studies: No results found.  Scheduled Meds: . allopurinol  100 mg Oral BID  . vitamin  C  1,000 mg Oral Daily  . aspirin EC  81 mg Oral q morning - 10a  . carvedilol  12.5 mg Oral BID WC  . cholecalciferol  1,000 Units Oral Q supper  . clopidogrel  75 mg Oral Q breakfast  . feeding supplement (GLUCERNA SHAKE)  237 mL Oral TID BM  . ferrous sulfate  325 mg Oral BID WC  . heparin  5,000 Units Subcutaneous Q8H  . insulin aspart  0-20 Units Subcutaneous TID WC  . insulin aspart  0-5 Units Subcutaneous QHS  . insulin glargine  20 Units Subcutaneous Daily  . levothyroxine  50 mcg Oral Q0600  . magnesium oxide  400 mg Oral Daily  . [START ON 05/28/2020] metolazone  2.5 mg Oral Once per day on Mon Fri  . pantoprazole  40 mg Oral Daily  . pregabalin  150 mg Oral BID  . torsemide  60 mg Oral BID  . traZODone  100 mg Oral QHS  . vitamin E  1,000 Units Oral Q supper   Continuous Infusions: . cefTRIAXone (ROCEPHIN)  IV 1 g (05/27/20 0929)     LOS: 3 days   Roxan Hockey, MD [facility] Pager [phone]

## 2020-05-27 NOTE — Anesthesia Postprocedure Evaluation (Signed)
Anesthesia Post Note  Patient: Susan [name]  Procedure(s) Performed: ESOPHAGOGASTRODUODENOSCOPY (EGD) WITH PROPOFOL (N/A ) BIOPSY  Patient location during evaluation: PACU Anesthesia Type: General Level of consciousness: awake Pain management: pain level controlled Vital Signs Assessment: post-procedure vital signs reviewed and stable Respiratory status: spontaneous breathing Cardiovascular status: blood pressure returned to baseline Anesthetic complications: no   No complications documented.   Last Vitals:  Vitals:   05/27/20 0546 05/27/20 0711  BP: (!) 167/80 (!) 172/81  Pulse: 80 75  Resp: 15 14  Temp:  36.9 C  SpO2:  100%    Last Pain:  Vitals:   05/27/20 0711  TempSrc: Oral  PainSc: 0-No pain                 Louann Sjogren

## 2020-05-27 NOTE — Op Note (Signed)
[facility] Patient Name: Susan [name] Procedure Date: 05/27/2020 7:15 AM MRN: [id number] Date of Birth: 1951/01/17 Attending MD: Elon Alas. Abbey Chatters DO CSN: [id number] Age: 70 Admit Type: Inpatient Procedure:                Upper GI endoscopy Indications:              Nausea with vomiting Providers:                Elon Alas. Abbey Chatters, DO, Janeece Riggers, RN, [place] Referring MD:              Medicines:                See the Anesthesia note for documentation of the                            administered medications Complications:            No immediate complications. Estimated Blood Loss:     Estimated blood loss was minimal. Procedure:                Pre-Anesthesia Assessment:                           - The anesthesia plan was to use monitored                            anesthesia care (MAC).                           After obtaining informed consent, the endoscope was                            passed under direct vision. Throughout the                            procedure, the patient's blood pressure, pulse, and                            oxygen saturations were monitored continuously. The                            Endoscope was introduced through the mouth, and                            advanced to the second part of duodenum. The upper                            GI endoscopy was accomplished without difficulty.                            The patient tolerated the procedure well. Scope In: 7:28:50 AM Scope Out: 7:32:59 AM Total Procedure Duration: 0 hours 4 minutes 9 seconds  Findings:      Mildly severe esophagitis with no bleeding was found in the middle third       of the esophagus. Cells for cytology were obtained by brushing.      Diffuse moderate inflammation characterized  by erosions and erythema was       found in the entire examined stomach. Biopsies were taken with a cold       forceps for Helicobacter pylori testing.      Previously placed hemoclip.       The duodenal bulb, first portion of the duodenum and second portion of       the duodenum were normal. Impression:               - Mildly severe candidiasis esophagitis with no                            bleeding. Cells for cytology obtained.                           - Gastritis. Biopsied.                           - Normal duodenal bulb, first portion of the                            duodenum and second portion of the duodenum. Moderate Sedation:      Per Anesthesia Care Recommendation:           - Return patient to hospital ward for ongoing care.                           - Advance diet as tolerated.                           - Treat for candidal esophagitis if cytology                            positive. Continue on PPI therapy for gastritis,                            treat for H pylori if biopsies positive. Procedure Code(s):        --- Professional ---                           [phone], Esophagogastroduodenoscopy, flexible,                            transoral; with biopsy, single or multiple Diagnosis Code(s):        --- Professional ---                           B37.81, Candidal esophagitis                           K29.70, Gastritis, unspecified, without bleeding                           R11.2, Nausea with vomiting, unspecified CPT copyright 2019 American Medical Association. All rights reserved. The codes documented in this report are preliminary and upon coder review may  be revised to meet current compliance requirements. Elon Alas. Abbey Chatters, DO [place], DO 05/27/2020 7:38:28  AM This report has been signed electronically. Number of Addenda: 0

## 2020-05-27 NOTE — Progress Notes (Signed)
Just completed EGD on this patient.  She has gastritis which I biopsied to rule out H. pylori.  Also appears that she has candidal esophagitis which may be causing her nausea and vomiting.  I took brushings of this and will check cytology.  There was a clip in the body of the stomach which was placed back in October 2020 after polypectomy.  Recommend continue on PPI therapy.  Advance diet as tolerated.  Treat for candidal esophagitis if cytology positive.  Await biopsy results, treat for H. pylori if these are positive.  GI to continue to follow.

## 2020-05-27 NOTE — Transfer of Care (Signed)
Immediate Anesthesia Transfer of Care Note  Patient: Susan [name]  Procedure(s) Performed: ESOPHAGOGASTRODUODENOSCOPY (EGD) WITH PROPOFOL (N/A ) BIOPSY  Patient Location: PACU  Anesthesia Type:General  Level of Consciousness: drowsy  Airway & Oxygen Therapy: Patient Spontanous Breathing and Patient connected to nasal cannula oxygen  Post-op Assessment: Report given to RN and Post -op Vital signs reviewed and stable  Post vital signs: Reviewed and stable  Last Vitals:  Vitals Value Taken Time  BP    Temp    Pulse    Resp    SpO2      Last Pain:  Vitals:   05/27/20 0711  TempSrc: Oral  PainSc: 0-No pain      Patients Stated Pain Goal: 5 (10/02/69 2524)  Complications: No complications documented.

## 2020-05-27 NOTE — Consult Note (Signed)
WOC Nurse Consult Note: Reason for Consult: bilateral buttock lesions, Stage 3 pressure injuries. Moisture associated skin damage, specifically intertriginous dermatitis to gluteal cleft. Wound type:Pressure plus moisture with suspected fungal infection Pressure Injury POA: Yes Measurement: Right and left buttock lesions measure 4cm x 2.4cm x 0.2cm with red and yellow wound beds, basement membrane evident. Wound bed:As described above and as noted in the photos provided to the EMR by Dr. Denton Brick this evening. Drainage (amount, consistency, odor) small serous Periwound: with evidence of moisture associated skin damage (ITD) Dressing procedure/placement/frequency: Patient is on a mattress replacement with low air loss feature and today I have added pressure redistribution heel boots for pressure injury prevention and slight elevation of LEs. Topical care to the buttock pressure injuries (Stage 3) will be with xeroform (antimicrobial nonadherent) dressings covered with silicone foam dressings. A prescriptive 1:1:1 compounded skin barrier of zinc oxide:hydrocortisone cream and lotrimin cream is provided for the perineum, medial thighs and gluteal cleft.  I additionally recommend consideration of a systemic antifungal, e.g., Diflucan for a more prompt resolution of the perineal and periwound sustpected yeast overgrowth. If you agree, please order.  The above is communicated to Dr. Denton Brick via [place].  [place] nursing team will not follow, but will remain available to this patient, the nursing and medical teams.  Please re-consult if needed. Thanks, Maudie Flakes, MSN, RN, [place], Arther Abbott  Pager# [phone]

## 2020-05-27 NOTE — Consult Note (Signed)
WOC Nurse Consult Note: Discussed with Dr. Sloan Leiter via [place] the need for a photograph of the patient's right and left buttock pressure injuries.  He was not able to take photo at this time but agreed to taking one tomorrow.  Consult pending photograph tomorrow morning, 05/27/20.    Thanks, Maudie Flakes, MSN, RN, [place], Arther Abbott  Pager# [phone]

## 2020-05-27 NOTE — Interval H&P Note (Signed)
History and Physical Interval Note:  05/27/2020 7:25 AM  Susan [name]  has presented today for surgery, with the diagnosis of Intractable nausea and vomiting.  The various methods of treatment have been discussed with the patient and family. After consideration of risks, benefits and other options for treatment, the patient has consented to  Procedure(s): ESOPHAGOGASTRODUODENOSCOPY (EGD) WITH PROPOFOL (N/A) as a surgical intervention.  The patient's history has been reviewed, patient examined, no change in status, stable for surgery.  I have reviewed the patient's chart and labs.  Questions were answered to the patient's satisfaction.     [name]

## 2020-05-27 NOTE — Anesthesia Preprocedure Evaluation (Signed)
Anesthesia Evaluation  Patient identified by MRN, date of birth, ID band Patient awake    Reviewed: Allergy & Precautions, H&P , NPO status , Patient's Chart, lab work & pertinent test results, reviewed documented beta blocker date and time   History of Anesthesia Complications (+) PONV and history of anesthetic complications  Airway Mallampati: II  TM Distance: >3 FB Neck ROM: full    Dental no notable dental hx. (+) Teeth Intact   Pulmonary shortness of breath, former smoker,    Pulmonary exam normal breath sounds clear to auscultation       Cardiovascular Exercise Tolerance: Good hypertension, + angina + CAD, + Past MI, + Peripheral Vascular Disease and +CHF   Rhythm:regular Rate:Normal     Neuro/Psych  Headaches, PSYCHIATRIC DISORDERS Anxiety Depression TIA Neuromuscular disease CVA, No Residual Symptoms    GI/Hepatic Neg liver ROS, GERD  Medicated,  Endo/Other  diabetesHypothyroidism   Renal/GU CRF and ESRFRenal disease  negative genitourinary   Musculoskeletal   Abdominal   Peds  Hematology  (+) Blood dyscrasia, anemia ,   Anesthesia Other Findings   Reproductive/Obstetrics negative OB ROS                             Anesthesia Physical Anesthesia Plan  ASA: III and emergent  Anesthesia Plan: General   Post-op Pain Management:    Induction:   PONV Risk Score and Plan: Propofol infusion  Airway Management Planned:   Additional Equipment:   Intra-op Plan:   Post-operative Plan:   Informed Consent: I have reviewed the patients History and Physical, chart, labs and discussed the procedure including the risks, benefits and alternatives for the proposed anesthesia with the patient or authorized representative who has indicated his/her understanding and acceptance.     Dental Advisory Given  Plan Discussed with: CRNA  Anesthesia Plan Comments:         Anesthesia  Quick Evaluation

## 2020-05-28 ENCOUNTER — Telehealth: Payer: Self-pay | Admitting: Gastroenterology

## 2020-05-28 DIAGNOSIS — R112 Nausea with vomiting, unspecified: Secondary | ICD-10-CM | POA: Diagnosis not present

## 2020-05-28 DIAGNOSIS — R197 Diarrhea, unspecified: Secondary | ICD-10-CM | POA: Diagnosis not present

## 2020-05-28 LAB — GLUCOSE, CAPILLARY
Glucose-Capillary: 175 mg/dL — ABNORMAL HIGH (ref 70–99)
Glucose-Capillary: 224 mg/dL — ABNORMAL HIGH (ref 70–99)
Glucose-Capillary: 291 mg/dL — ABNORMAL HIGH (ref 70–99)

## 2020-05-28 LAB — COMPREHENSIVE METABOLIC PANEL
ALT: 9 U/L (ref 0–44)
AST: 8 U/L — ABNORMAL LOW (ref 15–41)
Albumin: 2.3 g/dL — ABNORMAL LOW (ref 3.5–5.0)
Alkaline Phosphatase: 80 U/L (ref 38–126)
Anion gap: 10 (ref 5–15)
BUN: 23 mg/dL (ref 8–23)
CO2: 26 mmol/L (ref 22–32)
Calcium: 8.1 mg/dL — ABNORMAL LOW (ref 8.9–10.3)
Chloride: 99 mmol/L (ref 98–111)
Creatinine, Ser: 1.93 mg/dL — ABNORMAL HIGH (ref 0.44–1.00)
GFR, Estimated: 28 mL/min — ABNORMAL LOW (ref >60)
Glucose, Bld: 192 mg/dL — ABNORMAL HIGH (ref 70–99)
Potassium: 3.4 mmol/L — ABNORMAL LOW (ref 3.5–5.1)
Sodium: 135 mmol/L (ref 135–145)
Total Bilirubin: 0.3 mg/dL (ref 0.3–1.2)
Total Protein: 5.2 g/dL — ABNORMAL LOW (ref 6.5–8.1)

## 2020-05-28 MED ORDER — ASPIRIN EC 81 MG PO TBEC: 81.0000 mg | DELAYED_RELEASE_TABLET | Freq: Every day | ORAL | 11 refills | Status: DC

## 2020-05-28 MED ORDER — VITAMIN E 180 MG (400 UNIT) PO CAPS: 800.0000 [IU] | ORAL_CAPSULE | Freq: Every day | ORAL | Status: DC

## 2020-05-28 MED ORDER — PANTOPRAZOLE SODIUM 40 MG PO TBEC: 40.0000 mg | DELAYED_RELEASE_TABLET | Freq: Every day | ORAL | 3 refills | Status: DC

## 2020-05-28 MED ORDER — ONDANSETRON HCL 4 MG PO TABS: 4.0000 mg | ORAL_TABLET | Freq: Three times a day (TID) | ORAL | 1 refills | Status: DC | PRN

## 2020-05-28 MED ORDER — POTASSIUM CHLORIDE CRYS ER 20 MEQ PO TBCR
40.0000 meq | EXTENDED_RELEASE_TABLET | ORAL | Status: AC
  Administered 2020-05-28: 40 meq via ORAL
  Filled 2020-05-28: qty 2

## 2020-05-28 MED ORDER — GERHARDT'S BUTT CREAM: 1.0000 "application " | TOPICAL_CREAM | Freq: Three times a day (TID) | CUTANEOUS | 3 refills | Status: DC

## 2020-05-28 MED ORDER — PANTOPRAZOLE SODIUM 40 MG PO TBEC: 40.0000 mg | DELAYED_RELEASE_TABLET | Freq: Every day | ORAL | Status: DC

## 2020-05-28 MED ORDER — "XEROFORM PETROLAT GAUZE 5""X9"" EX MISC": 1.0000 | Freq: Two times a day (BID) | CUTANEOUS | 1 refills | Status: DC

## 2020-05-28 NOTE — Progress Notes (Signed)
    Subjective: Denies N/V. Drowsy. Unknown year. No abdominal pain. No diarrhea. Per nursing staff, no vomiting. Ate 65% of dinner last night. No dysphagia. Last BM 05/27/20 in afternoon.   Objective: Vital signs in last 24 hours: Temp:  [97.4 F (36.3 C)-98 F (36.7 C)] 98 F (36.7 C) (01/03 0524) Pulse Rate:  [74-84] 80 (01/03 0524) Resp:  [11-20] 20 (01/03 0524) BP: (131-163)/(58-80) 142/67 (01/03 0524) SpO2:  [97 %-100 %] 99 % (01/03 0524) Last BM Date: 05/26/20 General:   Drowsy but awakens to verbal stimuli. Difficult to engage in conversation, answering in one word responses.  Abdomen:  Bowel sounds present, obese, soft, non-tender. No rebound or guarding appreciated Neurologic:  Oriented to person. Unsure of year.    Intake/Output from previous day: 01/02 0701 - 01/03 0700 In: 440 [P.O.:240; I.V.:200] Out: 600 [Urine:600] Intake/Output this shift: No intake/output data recorded.  Lab Results: Recent Labs    05/26/20 0643  WBC 6.0  HGB 10.8*  HCT 35.8*  PLT 321   BMET Recent Labs    05/26/20 0643 05/27/20 1155 05/28/20 0459  NA 134* 129* 135  K 3.8 3.5 3.4*  CL 98 98 99  CO2 28 24 26   GLUCOSE 288* 400* 192*  BUN 23 21 23   CREATININE 1.48* 1.75* 1.93*  CALCIUM 8.7* 8.1* 8.1*   LFT Recent Labs    05/26/20 0643 05/27/20 1155 05/28/20 0459  PROT 5.7* 5.8* 5.2*  ALBUMIN 2.6* 2.7* 2.3*  AST 9* 12* 8*  ALT 11 10 9   ALKPHOS 92 91 80  BILITOT 0.5 0.3 0.3    Assessment: 70 year old female admitted with intractable N/V, diarrhea, epigastric pain, s/p EGD yesterday with concern for candida esophagitis but no yeast on KOH prep, gastritis s/p biopsy, normal duodenum. CT on file 12/23 with mild intrahepatic duct dilatation, cholelithiasis, and small bilateral pleural effusions. LFTs normal.   Clinically, she has improved with resolution of N/V/D, tolerating 65% of dinner last night. Difficult to obtain a detailed history today as she states she did not  sleep well last night and is drowsy. I did speak with nursing staff, who confirm resolution of symptoms. Unable to exclude underlying delayed gastric emptying in setting of uncontrolled diabetes. UTI could also contribute to N/V.   Diarrhea: GI pathogen panel and Cdiff negative. Resolved.   Chronic anemia: last ferritin 187 in Oct 2021. Likely anemia of chronic disease.   Plan: Continue PPI. I have changed dosing to 30 minutes before breakfast for best efficacy.   Continue soft diet  Follow-up on pathology from EGD  Will need outpatient follow-up with Dr. Collene Mares, primary GI   Annitta Needs, PhD, ANP-BC [facility]    LOS: 4 days    05/28/2020, 8:28 AM

## 2020-05-28 NOTE — Care Management Important Message (Signed)
Important Message  Patient Details  Name: Susan [name] MRN: [id number] Date of Birth: 12/23/1950   Medicare Important Message Given:  Yes     Tommy Medal 05/28/2020, 11:57 AM

## 2020-05-28 NOTE — Discharge Summary (Signed)
Susan [name], is a 70 y.o. female  DOB June 13, 1950  MRN [id number].  Admission date:  05/24/2020  Admitting Physician  Bernadette Hoit, DO  Discharge Date:  05/28/2020   Primary MD  Alycia Rossetti, MD  Recommendations for primary care physician for things to follow:   1)Avoid ibuprofen/Advil/Aleve/Motrin/Goody Powders/Naproxen/BC powders/Meloxicam/Diclofenac/Indomethacin and other Nonsteroidal anti-inflammatory medications as these will make you more likely to bleed and can cause stomach ulcers, can also cause Kidney problems.   2) please take Protonix 40 mg daily as prescribed  3) please follow-up with a gastroenterologist Dr. Collene Mares in 3 to 4 weeks for recheck and reevaluation  4) please follow-up with your primary care physician in 2 to 3 weeks for buttock wound check  Admission Diagnosis  Acute exacerbation of CHF (congestive heart failure) ([place]) [I50.9] Acute on chronic heart failure, unspecified heart failure type ([place]) [I50.9] Intractable vomiting with nausea, unspecified vomiting type [R11.2]   Discharge Diagnosis  Acute exacerbation of CHF (congestive heart failure) ([place]) [I50.9] Acute on chronic heart failure, unspecified heart failure type ([place]) [I50.9] Intractable vomiting with nausea, unspecified vomiting type [R11.2]    Principal Problem:   Nausea vomiting and diarrhea Active Problems:   Essential hypertension   Hyperglycemia due to diabetes mellitus (HCC)   Elevated troponin   Prolonged QT interval   Obesity, Class III, BMI 40-49.9 (morbid obesity) (HCC)   Acute exacerbation of CHF (congestive heart failure) (HCC)   Chronic kidney disease (CKD)   Hypoalbuminemia   GERD (gastroesophageal reflux disease)      Past Medical History:  Diagnosis Date  . Acute MI ([place]) 1999; 2007  . Anemia    hx  . Anginal pain ([name])   . Anxiety   . ARF (acute renal failure) ([place]) 06/2017    [facility]  . Arthritis    "generalized" (03/15/2014)  . CAD (coronary artery disease)    MI in 2000 - MI  2007 - treated bare metal stent (no nuclear since then as 9/11)  . Carotid artery disease ([place])   . CHF (congestive heart failure) ([place])   . Chronic diastolic heart failure (HCC)    a) ECHO (08/2013) EF 55-60% and RV function nl b) RHC (08/2013) RA 4, RV 30/5/7, PA 25/10 (16), PCWP 7, Fick CO/CI 6.3/2.7, PVR 1.5 WU, PA 61 and 66%  . Daily headache    "~ every other day; since I fell in June" (03/15/2014)  . Depression   . Dyslipidemia   . Dyspnea   . Exertional shortness of breath   . HTN (hypertension)   . Hypothyroidism   . Neuropathy   . Obesity   . Osteoarthritis   . Peripheral neuropathy   . PONV (postoperative nausea and vomiting)   . RBBB (right bundle branch block)    Old  . Stroke [facility])    mini strokes  . Syncope    likely due to low blood sugar  . Tachycardia    Sinus tachycardia  . Type II diabetes mellitus ([place])  Type II  . Urinary incontinence   . Venous insufficiency     Past Surgical History:  Procedure Laterality Date  . ABDOMINAL HYSTERECTOMY  1980's  . AMPUTATION Right 02/24/2018   Procedure: RIGHT FOOT GREAT TOE AND 2ND TOE AMPUTATION;  Surgeon: Newt Minion, MD;  Location: [facility];  Service: Orthopedics;  Laterality: Right;  . AMPUTATION Right 04/30/2018   Procedure: RIGHT TRANSMETATARSAL AMPUTATION;  Surgeon: Newt Minion, MD;  Location: [place] [facility];  Service: Orthopedics;  Laterality: Right;  . CATARACT EXTRACTION, BILATERAL Bilateral ?2013  . COLONOSCOPY W/ POLYPECTOMY    . COLONOSCOPY WITH PROPOFOL N/A 03/13/2019   Procedure: COLONOSCOPY WITH PROPOFOL;  Surgeon: Jerene Bears, MD;  Location: [place];  Service: Gastroenterology;  Laterality: N/A;  . [place]; 2007   "1 + 1"  . ESOPHAGOGASTRODUODENOSCOPY (EGD) WITH PROPOFOL N/A 03/13/2019   Procedure: ESOPHAGOGASTRODUODENOSCOPY (EGD) WITH  PROPOFOL;  Surgeon: Jerene Bears, MD;  Location: [facility] ENDOSCOPY;  Service: Gastroenterology;  Laterality: N/A;  . EYE SURGERY Bilateral    [place]  . HEMOSTASIS CLIP PLACEMENT  03/13/2019   Procedure: HEMOSTASIS CLIP PLACEMENT;  Surgeon: Jerene Bears, MD;  Location: [facility] ENDOSCOPY;  Service: Gastroenterology;;  . KNEE ARTHROSCOPY Left 10/25/2006  . POLYPECTOMY  03/13/2019   Procedure: POLYPECTOMY;  Surgeon: Jerene Bears, MD;  Location: [place];  Service: Gastroenterology;;  . RIGHT HEART CATH N/A 07/24/2017   Procedure: RIGHT HEART CATH;  Surgeon: Jolaine Artist, MD;  Location: [facility] CV LAB;  Service: Cardiovascular;  Laterality: N/A;  . RIGHT HEART CATHETERIZATION N/A 09/22/2013   Procedure: RIGHT HEART CATH;  Surgeon: Jolaine Artist, MD;  Location: [facility] CATH LAB;  Service: Cardiovascular;  Laterality: N/A;  . SHOULDER ARTHROSCOPY WITH OPEN ROTATOR CUFF REPAIR Right 03/14/2014   Procedure: RIGHT SHOULDER ARTHROSCOPY WITH BICEPS RELEASE, OPEN SUBSCAPULA REPAIR, OPEN SUPRASPINATUS REPAIR.;  Surgeon: Meredith Pel, MD;  Location: [place];  Service: Orthopedics;  Laterality: Right;  . TOE AMPUTATION Right 02/24/2018   GREAT TOE AND 2ND TOE AMPUTATION  . TUBAL LIGATION  1970's     HPI  from the history and physical done on the day of admission:    Chief Complaint: Nausea, vomiting and diarrhea  HPI: Susan [name] is a 70 y.o. female with medical history significant for chronic diastolic congestive heart failure, essential hypertension, type 2 diabetes mellitus, CKD stage IV, obesity, peripheral vascular disease,andchronic neuropathy who presented to the ED via EMS due to ongoing nausea, vomiting and diarrhea.  She was admitted for same symptoms from 12/23-12/24 which was suspected to be due to viral gastroenteritis versus possible diabetic gastroparesis.  She complained of ongoing symptoms with an average of 3-4 watery diarrhea daily.  Patient states that she has not been taking  her torsemide since she was discharged from the hospital due to inability to tolerate oral intake.  She complained of intermittent cough and some shortness of breath as well as with bilateral leg swelling (which patient does not think was worse than baseline).  She endorsed sharp chest pain in the left chest area with radiation to left shoulder.  She states that she took 2 nitroglycerin sublingual and the chest pain resolved.  She endorsed some abdominal pain which worsens with food.  ED Course:  In the emergency department, she was initially tachycardic and tachypneic.  BP was 195/89.  Work-up in the ED showed normocytic anemia, BUN to creatinine 21/1.41 (creatinine within baseline range) and  hyperglycemia.  Troponin 10 > 84, BNP 886, urinalysis was unimpressive for UTI, albumin 2.9 Chest x-ray showed stable bilateral interstitial and ground-glass opacities, which could reflect edema or multifocal pneumonia. Aspirin was given, IV Lasix 20mg  x1 was given, insulin was given due to hyperglycemia.  Hospitalist was asked to admit patient for further evaluation and management.     Hospital Course:   Brief Narrative:  Patient is 70 year old female with history of chronic diastolic congestive heart failure, hypertension, type 2 diabetes on insulin pump, stage IV chronic kidney disease, peripheral vascular disease and chronic neuropathy presented to the ER with ongoing nausea vomiting and diarrhea for almost 4 weeks now.  Patient was admitted to hospital with same symptoms on 12/23-12/24.  Suspected with viral gastroenteritis and discharged home.  Patient had ongoing nausea and unable to tolerate oral medications.  Also has intermittent cough and shortness of breath as well as bilateral leg swelling so came to the hospital. In the emergency room, initially tachycardic and tachypneic.  Blood pressures elevated.  Renal functions within normal range.  Hyperglycemic.  Troponin 10-84.  BNP 886.  Chest x-ray with  bilateral interstitial and groundglass opacities.  She was given dose of aspirin.  IV Lasix 20 mg x 1.  Insulin.  Admitted with ongoing nausea vomiting and intolerance to diet.  Assessment & Plan:   Principal Problem:   Nausea vomiting and diarrhea Active Problems:   Essential hypertension   Hyperglycemia due to diabetes mellitus (HCC)   Elevated troponin   Prolonged QT interval   Obesity, Class III, BMI 40-49.9 (morbid obesity) (HCC)   Acute exacerbation of CHF (congestive heart failure) (HCC)   Chronic kidney disease (CKD)   Hypoalbuminemia   GERD (gastroesophageal reflux disease)  A/p Nausea, vomiting and diarrhea: Chronic and recurrent symptoms.  --- Status post EGD on 05/27/2020 with finding of  esophagitis and gastritis ---KOH prep is negative -Cytology and biopsies pending for H. pylori and fungus  --CT scan abdomen pelvis with contrast on 12/23 with no evidence of acute intra-abdominal process. --C. difficile is negative.   GI pathogen panel Neg -Follow-up with GI as outpatient  Presumed acute exacerbation of diastolic heart failure: Recent echocardiogram with normal ejection fraction.  BNP 886.  Treated with with IV Lasix,  overall improved -Continue home dose of torsemide 60 mg twice daily --  Type 2 diabetes with hyperglycemia: Uncontrolled. Patient on insulin pump at home.    GERD: On PPI, EGD noted, follow-up with GI as outpatient   Peripheral vascular disease: Patient on aspirin and Plavix.    Sacral Decub----wound care consult requested--- please see photos in epic --Topical nystatin advised   Code Status: Full code Family Communication: Husband at bedside   Consultants:   Gastroenterology  Procedures:   EGD 05/27/2020  Antimicrobials:   Rocephin, 12/30---   Discharge Condition: stable  Follow UP  Diet and Activity recommendation:  As advised  Discharge Instructions    Discharge Instructions    Call MD for:  difficulty  breathing, headache or visual disturbances   Complete by: As directed    Call MD for:  persistant dizziness or light-headedness   Complete by: As directed    Call MD for:  persistant nausea and vomiting   Complete by: As directed    Call MD for:  temperature >100.4   Complete by: As directed    Diet - low sodium heart healthy   Complete by: As directed    Discharge instructions   Complete by:  As directed    1)Avoid ibuprofen/Advil/Aleve/Motrin/Goody Powders/Naproxen/BC powders/Meloxicam/Diclofenac/Indomethacin and other Nonsteroidal anti-inflammatory medications as these will make you more likely to bleed and can cause stomach ulcers, can also cause Kidney problems.   2) please take Protonix 40 mg daily as prescribed  3) please follow-up with a gastroenterologist Dr. Collene Mares in 3 to 4 weeks for recheck and reevaluation  4) please follow-up with your primary care physician in 2 to 3 weeks for buttock wound check   Discharge wound care:   Complete by: As directed    Nystatin Gerhardt Butt  cream as advised   Increase activity slowly   Complete by: As directed         Discharge Medications     Allergies as of 05/28/2020      Reactions   Codeine Nausea And Vomiting      Medication List    STOP taking these medications   cephALEXin 250 MG capsule Commonly known as: Keflex     TAKE these medications   albuterol 108 (90 Base) MCG/ACT inhaler Commonly known as: VENTOLIN HFA TAKE 2 PUFFS BY MOUTH EVERY 6 HOURS AS NEEDED FOR WHEEZE OR SHORTNESS OF BREATH What changed: See the new instructions.   allopurinol 100 MG tablet Commonly known as: ZYLOPRIM TAKE 1 TABLET BY MOUTH TWICE A DAY   aspirin EC 81 MG tablet Take 1 tablet (81 mg total) by mouth daily with breakfast. What changed: when to take this   carvedilol 12.5 MG tablet Commonly known as: COREG Take 1 tablet (12.5 mg total) by mouth 2 (two) times daily with a meal.   cholecalciferol 1000 units tablet Commonly known  as: VITAMIN D Take 1,000 Units by mouth daily with supper.   clopidogrel 75 MG tablet Commonly known as: PLAVIX Take 1 tablet (75 mg total) by mouth daily with breakfast.   ferrous sulfate 325 (65 FE) MG tablet Take 1 tablet (325 mg total) by mouth 2 (two) times daily with a meal. Reported on 08/21/2015   Gerhardt's butt cream Crea Apply 1 application topically 3 (three) times daily. Apply Nystatin (Gerhardt's BUTT Cream) to buttock wound   guaiFENesin-dextromethorphan 100-10 MG/5ML syrup Commonly known as: ROBITUSSIN DM Take 10 mLs by mouth every 4 (four) hours as needed for cough.   insulin NPH Human 100 UNIT/ML injection Commonly known as: NOVOLIN N Inject 10-15 Units into the skin See admin instructions. Inject 15 units subcutaneously in the morning if CBG >300; inject 10 units 200-300   insulin regular 100 units/mL injection Commonly known as: NOVOLIN R Inject into the skin See admin instructions. Manually add bolus to continuous dose via OmniPod 3 times daily per sliding scale (CBG 80-150 7 units, 151-200 9 units, 201-250 12 units, 251-300 14 units, 301- 400 17 units)   levothyroxine 50 MCG tablet Commonly known as: SYNTHROID Take 1 tablet (50 mcg total) by mouth daily before breakfast.   magnesium oxide 400 MG tablet Commonly known as: MAG-OX TAKE 1 TABLET BY MOUTH EVERY DAY   metolazone 2.5 MG tablet Commonly known as: ZAROXOLYN Take 2.5 mg by mouth See admin instructions. Take 1 tablet by mouth on Monday and Fridays   nitroGLYCERIN 0.4 MG SL tablet Commonly known as: NITROSTAT Place 1 tablet (0.4 mg total) under the tongue every 5 (five) minutes as needed for chest pain. What changed: reasons to take this   OmniPod Dash 5 Pack Pods Misc Inject into the skin See admin instructions. Use continuously with Novolin R - change every 72  hours.   ondansetron 4 MG tablet Commonly known as: Zofran Take 1 tablet (4 mg total) by mouth every 8 (eight) hours as needed for  nausea or vomiting.   ondansetron 8 MG disintegrating tablet Commonly known as: ZOFRAN-ODT Take 1 tablet (8 mg total) by mouth 3 (three) times daily. What changed:   when to take this  reasons to take this   pantoprazole 40 MG tablet Commonly known as: PROTONIX Take 1 tablet (40 mg total) by mouth daily before breakfast. Start taking on: May 29, 2020 What changed: when to take this   pregabalin 150 MG capsule Commonly known as: Lyrica Take 1 capsule (150 mg total) by mouth 2 (two) times daily.   torsemide 20 MG tablet Commonly known as: DEMADEX Take 2 tablets (40 mg total) by mouth 2 (two) times daily. What changed: how much to take   traZODone 100 MG tablet Commonly known as: DESYREL TAKE 1 AND 1/2 TABLETS BY MOUTH AT BEDTIME AS NEEDED FOR SLEEP. What changed: See the new instructions.   ursodiol 500 MG tablet Commonly known as: ACTIGALL Take 500 mg by mouth 3 (three) times daily.   vitamin C 1000 MG tablet Take 1,000 mg by mouth daily.   vitamin E 1000 UNIT capsule Take 1,000 Units by mouth daily with supper.   Xeroform Petrolat Gauze 5"x9" Misc Apply 1 each topically 2 (two) times daily. For dressing changes            Discharge Care Instructions  (From admission, onward)         Start     Ordered   05/28/20 0000  Discharge wound care:       Comments: Nystatin Gerhardt Butt  cream as advised   05/28/20 1307          Major procedures and Radiology Reports - PLEASE review detailed and final reports for all details, in brief -   CT ABDOMEN PELVIS W CONTRAST  Result Date: 05/17/2020 CLINICAL DATA:  Abdominal pain EXAM: CT ABDOMEN AND PELVIS WITH CONTRAST TECHNIQUE: Multidetector CT imaging of the abdomen and pelvis was performed using the standard protocol following bolus administration of intravenous contrast. CONTRAST:  75mL OMNIPAQUE IOHEXOL 300 MG/ML  SOLN COMPARISON:  05/28/2019 FINDINGS: Lower chest: Small bilateral pleural effusions with  bibasilar atelectasis/scarring. Hepatobiliary: Few calcified granulomas of the liver. Cholelithiasis. Mild intrahepatic duct dilatation. Common bile duct is normal in caliber. Pancreas: Unremarkable. Spleen: Small calcified granulomas.  Otherwise unremarkable. Adrenals/Urinary Tract: Adrenals are unremarkable. Too small to characterize low-attenuation lesion of the right kidney. Air within the bladder is presumably due to instrumentation. Stomach/Bowel: Stomach is within normal limits. Bowel is normal in caliber. Sigmoid diverticulosis. Vascular/Lymphatic: Aortic atherosclerosis. No enlarged lymph nodes identified. Reproductive: Status post hysterectomy. No adnexal masses. Other: No ascites.  Abdominal wall is unremarkable. Musculoskeletal: Degenerative changes of the included spine. No acute osseous abnormality. IMPRESSION: Mild intrahepatic duct dilatation. Cholelithiasis. Small bilateral pleural effusions. Electronically Signed   By: Macy Mis M.D.   On: 05/17/2020 12:44   DG Chest Portable 1 View  Result Date: 05/24/2020 CLINICAL DATA:  Cough, short of breath, nausea and vomiting, diarrhea for 2 weeks EXAM: PORTABLE CHEST 1 VIEW COMPARISON:  05/17/2020 FINDINGS: Single frontal view of the chest demonstrates an unremarkable cardiac silhouette. Interstitial and ground-glass opacities throughout the lungs are again noted, with no significant change since prior study. No effusion or pneumothorax. No acute bony abnormalities. IMPRESSION: 1. Stable bilateral interstitial and ground-glass opacities, which could reflect edema  or multifocal pneumonia. Electronically Signed   By: Randa Ngo M.D.   On: 05/24/2020 18:33   DG Chest Portable 1 View  Result Date: 05/17/2020 CLINICAL DATA:  Cough and history of prior COVID-19 positivity EXAM: PORTABLE CHEST 1 VIEW COMPARISON:  03/07/2020 FINDINGS: Cardiac shadow is within normal limits. Aortic calcifications are noted. The lungs are well aerated bilaterally.  Increased bilateral airspace opacities are noted when compare with the prior exam consistent with the given clinical history of prior COVID-19 positivity. No sizable effusion is seen. No bony abnormality is noted. IMPRESSION: Increase in bilateral airspace opacities when compare with the prior exam consistent with the given clinical history. Electronically Signed   By: Inez Catalina M.D.   On: 05/17/2020 08:25    Micro Results    Recent Results (from the past 240 hour(s))  Resp Panel by RT-PCR (Flu A&B, Covid) Nasopharyngeal Swab     Status: None   Collection Time: 05/25/20 12:16 AM   Specimen: Nasopharyngeal Swab; Nasopharyngeal(NP) swabs in vial transport medium  Result Value Ref Range Status   SARS Coronavirus 2 by RT PCR NEGATIVE NEGATIVE Final    Comment: (NOTE) SARS-CoV-2 target nucleic acids are NOT DETECTED.  The SARS-CoV-2 RNA is generally detectable in upper respiratory specimens during the acute phase of infection. The lowest concentration of SARS-CoV-2 viral copies this assay can detect is 138 copies/mL. A negative result does not preclude SARS-Cov-2 infection and should not be used as the sole basis for treatment or other patient management decisions. A negative result may occur with  improper specimen collection/handling, submission of specimen other than nasopharyngeal swab, presence of viral mutation(s) within the areas targeted by this assay, and inadequate number of viral copies(<138 copies/mL). A negative result must be combined with clinical observations, patient history, and epidemiological information. The expected result is Negative.  Fact Sheet for Patients:  EntrepreneurPulse.com.au  Fact Sheet for Healthcare Providers:  IncredibleEmployment.be  This test is no t yet approved or cleared by the [place] FDA and  has been authorized for detection and/or diagnosis of SARS-CoV-2 by FDA under an Emergency Use Authorization  (EUA). This EUA will remain  in effect (meaning this test can be used) for the duration of the COVID-19 declaration under Section 564(b)(1) of the Act, 21 U.S.C.section 360bbb-3(b)(1), unless the authorization is terminated  or revoked sooner.       Influenza A by PCR NEGATIVE NEGATIVE Final   Influenza B by PCR NEGATIVE NEGATIVE Final    Comment: (NOTE) The Xpert Xpress SARS-CoV-2/FLU/RSV plus assay is intended as an aid in the diagnosis of influenza from Nasopharyngeal swab specimens and should not be used as a sole basis for treatment. Nasal washings and aspirates are unacceptable for Xpert Xpress SARS-CoV-2/FLU/RSV testing.  Fact Sheet for Patients: EntrepreneurPulse.com.au  Fact Sheet for Healthcare Providers: IncredibleEmployment.be  This test is not yet approved or cleared by the [place] FDA and has been authorized for detection and/or diagnosis of SARS-CoV-2 by FDA under an Emergency Use Authorization (EUA). This EUA will remain in effect (meaning this test can be used) for the duration of the COVID-19 declaration under Section 564(b)(1) of the Act, 21 U.S.C. section 360bbb-3(b)(1), unless the authorization is terminated or revoked.  Performed at [facility], [address] [place], [place] [address]   C Difficile Quick Screen w PCR reflex     Status: None   Collection Time: 05/25/20  3:31 PM   Specimen: Stool  Result Value Ref Range Status  C Diff antigen NEGATIVE NEGATIVE Final   C Diff toxin NEGATIVE NEGATIVE Final   C Diff interpretation No C. difficile detected.  Final    Comment: Performed at [facility], [address] [place], [place] [address]  Gastrointestinal Panel by PCR , Stool     Status: None   Collection Time: 05/25/20  3:31 PM   Specimen: Stool  Result Value Ref Range Status   Campylobacter species NOT DETECTED NOT DETECTED Final   Plesimonas shigelloides NOT DETECTED NOT DETECTED Final   Salmonella  species NOT DETECTED NOT DETECTED Final   Yersinia enterocolitica NOT DETECTED NOT DETECTED Final   Vibrio species NOT DETECTED NOT DETECTED Final   Vibrio cholerae NOT DETECTED NOT DETECTED Final   Enteroaggregative E coli (EAEC) NOT DETECTED NOT DETECTED Final   Enteropathogenic E coli (EPEC) NOT DETECTED NOT DETECTED Final   Enterotoxigenic E coli (ETEC) NOT DETECTED NOT DETECTED Final   Shiga like toxin producing E coli (STEC) NOT DETECTED NOT DETECTED Final   Shigella/Enteroinvasive E coli (EIEC) NOT DETECTED NOT DETECTED Final   Cryptosporidium NOT DETECTED NOT DETECTED Final   Cyclospora cayetanensis NOT DETECTED NOT DETECTED Final   Entamoeba histolytica NOT DETECTED NOT DETECTED Final   Giardia lamblia NOT DETECTED NOT DETECTED Final   Adenovirus F40/41 NOT DETECTED NOT DETECTED Final   Astrovirus NOT DETECTED NOT DETECTED Final   Norovirus GI/GII NOT DETECTED NOT DETECTED Final   Rotavirus A NOT DETECTED NOT DETECTED Final   Sapovirus (I, II, IV, and V) NOT DETECTED NOT DETECTED Final    Comment: Performed at [facility], [address] [place], [place] [address]  KOH prep     Status: None   Collection Time: 05/27/20  7:37 AM   Specimen: PATH GI biopsy  Result Value Ref Range Status   Specimen Description ESOPHAGUS  Final   Special Requests NONE  Final   KOH Prep   Final    NO YEAST OR FUNGAL ELEMENTS SEEN Performed at [facility], [address] [place], [place] [address]    Report Status 05/27/2020 FINAL  Final    Today   Subjective    [name] today has no new complaints Tolerating oral intake well          Patient has been seen and examined prior to discharge   Objective   Blood pressure (!) 142/67, pulse 80, temperature 98 F (36.7 C), temperature source Oral, resp. rate 20, height 5\' 6"  (1.676 m), weight 112.5 kg, SpO2 99 %.   Intake/Output Summary (Last 24 hours) at 05/28/2020 1309 Last data filed at 05/27/2020 1900 Gross per 24 hour   Intake 240 ml  Output 600 ml  Net -360 ml   Exam Gen:- Awake Alert, no acute distress  HEENT:- [place].AT, No sclera icterus Neck-Supple Neck,No JVD,.  Lungs-  CTAB , good air movement bilaterally  CV- S1, S2 normal, regular Abd-  +ve B.Sounds, Abd Soft, No tenderness,    Extremity- No  edema,   good pulses Psych-affect is appropriate, oriented x3 Neuro-no new focal deficits, no tremors  -Skin: Sacral decubitus----please see photos in epic   Data Review   CBC w Diff:  Lab Results  Component Value Date   WBC 6.0 05/26/2020   HGB 10.8 (L) 05/26/2020   HCT 35.8 (L) 05/26/2020   PLT 321 05/26/2020   LYMPHOPCT 15 05/26/2020   MONOPCT 8 05/26/2020   EOSPCT 9 05/26/2020   BASOPCT 1 05/26/2020    CMP:  Lab Results  Component Value Date   NA 135 05/28/2020   K 3.4 (L) 05/28/2020   CL 99 05/28/2020   CO2 26 05/28/2020   BUN 23 05/28/2020   CREATININE 1.93 (H) 05/28/2020   CREATININE 2.21 (H) 04/04/2020   PROT 5.2 (L) 05/28/2020   ALBUMIN 2.3 (L) 05/28/2020   BILITOT 0.3 05/28/2020   ALKPHOS 80 05/28/2020   AST 8 (L) 05/28/2020   ALT 9 05/28/2020  .   Total Discharge time is about 33 minutes  Roxan Hockey M.D on 05/28/2020 at 1:09 PM  Go to [email or web address] -  for contact info  [facility] - Office  [phone]

## 2020-05-28 NOTE — Discharge Instructions (Signed)
1)Avoid ibuprofen/Advil/Aleve/Motrin/Goody Powders/Naproxen/BC powders/Meloxicam/Diclofenac/Indomethacin and other Nonsteroidal anti-inflammatory medications as these will make you more likely to bleed and can cause stomach ulcers, can also cause Kidney problems.   2) please take Protonix 40 mg daily as prescribed  3) please follow-up with a gastroenterologist Dr. Collene Mares in 3 to 4 weeks for recheck and reevaluation  4) please follow-up with your primary care physician in 2 to 3 weeks for buttock wound check

## 2020-05-28 NOTE — Telephone Encounter (Signed)
[facility] clinical pool: can we help facilitate hospital follow-up with Dr. [name], patient's primary GI?

## 2020-05-28 NOTE — TOC Transition Note (Signed)
Transition of Care [facility]) - CM/SW Discharge Note   Patient Details  Name: Susan [name] MRN: [id number] Date of Birth: 02-13-1951  Transition of Care [facility]) CM/SW Contact:  Boneta Lucks, RN Phone Number: 05/28/2020, 12:52 PM   Clinical Narrative:   Patient discharging today. Admitted with Nausea, vomiting and diarrhea. Patient has a high risk for readmission. Patient state her husband will be here after work to transport her home. Reviewed home DME, she has cane, walker, bedside commode. Patient states she will follow up with PCP and GI doctors this week. No need identified.     Final next level of care: Home/Self Care Barriers to Discharge: Barriers Resolved   Patient Goals and CMS Choice Patient states their goals for this hospitalization and ongoing recovery are:: to go home. CMS Medicare.gov Compare Post Acute Care list provided to:: Patient Choice offered to / list presented to : Patient  Discharge Placement              Patient chooses bed at:  [facility]) Patient to be transferred to facility by: Husband   Patient and family notified of of transfer: 05/28/20  Discharge Plan and Services   Readmission Risk Interventions Readmission Risk Prevention Plan 05/28/2020 03/12/2020 02/09/2020  Transportation Screening Complete Complete Complete  Medication Review Press photographer) Complete Complete Complete  PCP or Specialist appointment within 3-5 days of discharge Complete Complete Complete  HRI or Home Care Consult Complete Complete Complete  SW Recovery Care/Counseling Consult Complete Complete Complete  Palliative Care Screening Not Applicable Not Applicable Not [place] Not Applicable Not Applicable Not Applicable  Some recent data might be hidden

## 2020-05-28 NOTE — Progress Notes (Signed)
IV removed, pt and belongings taken to main entrance

## 2020-05-29 ENCOUNTER — Other Ambulatory Visit (HOSPITAL_COMMUNITY): Payer: Self-pay | Admitting: Cardiology

## 2020-05-29 ENCOUNTER — Other Ambulatory Visit: Payer: Self-pay

## 2020-05-29 LAB — SURGICAL PATHOLOGY

## 2020-05-29 NOTE — Telephone Encounter (Signed)
Referral sent to Dr. Collene Mares via [place].

## 2020-05-29 NOTE — Addendum Note (Signed)
Addended by: Hassan Rowan on: 05/29/2020 03:20 PM   Modules accepted: Orders

## 2020-05-30 DIAGNOSIS — H43813 Vitreous degeneration, bilateral: Secondary | ICD-10-CM | POA: Diagnosis not present

## 2020-05-30 DIAGNOSIS — E113513 Type 2 diabetes mellitus with proliferative diabetic retinopathy with macular edema, bilateral: Secondary | ICD-10-CM | POA: Diagnosis not present

## 2020-05-30 DIAGNOSIS — H43392 Other vitreous opacities, left eye: Secondary | ICD-10-CM | POA: Diagnosis not present

## 2020-05-31 ENCOUNTER — Ambulatory Visit: Payer: PPO | Admitting: Orthopedic Surgery

## 2020-05-31 ENCOUNTER — Encounter (HOSPITAL_COMMUNITY): Payer: Self-pay | Admitting: Internal Medicine

## 2020-06-01 ENCOUNTER — Telehealth: Payer: Self-pay | Admitting: *Deleted

## 2020-06-01 MED ORDER — NYSTATIN 100000 UNIT/GM EX POWD: 1.0000 "application " | Freq: Three times a day (TID) | CUTANEOUS | 0 refills | Status: DC | PRN

## 2020-06-01 MED ORDER — FLUCONAZOLE 150 MG PO TABS: 150.0000 mg | ORAL_TABLET | Freq: Once | ORAL | 0 refills | Status: AC

## 2020-06-01 NOTE — Telephone Encounter (Signed)
Received call from patient.   Reports that she has vaginal irritation and itching. States that she has white discharge.   Prescription sent to pharmacy for Diflucan. Advised that if S/Sx do not resolve after dosage, OV will be required.   Verbalized understanding.

## 2020-06-04 ENCOUNTER — Telehealth: Payer: Self-pay | Admitting: *Deleted

## 2020-06-04 NOTE — Telephone Encounter (Signed)
Received call from patient.   Reports that she is having foul taste in her mouth and the top of her mouth is sore.   Advised to go to [facility] for evaluation.

## 2020-06-05 ENCOUNTER — Encounter: Payer: Self-pay | Admitting: Emergency Medicine

## 2020-06-05 ENCOUNTER — Ambulatory Visit
Admission: EM | Admit: 2020-06-05 | Discharge: 2020-06-05 | Disposition: A | Discharge status: Home / Self Care | Payer: HMO | Attending: Emergency Medicine | Admitting: Emergency Medicine

## 2020-06-05 ENCOUNTER — Other Ambulatory Visit: Payer: Self-pay

## 2020-06-05 DIAGNOSIS — B3731 Acute candidiasis of vulva and vagina: Secondary | ICD-10-CM

## 2020-06-05 DIAGNOSIS — R300 Dysuria: Secondary | ICD-10-CM | POA: Diagnosis not present

## 2020-06-05 DIAGNOSIS — B373 Candidiasis of vulva and vagina: Secondary | ICD-10-CM | POA: Insufficient documentation

## 2020-06-05 DIAGNOSIS — N898 Other specified noninflammatory disorders of vagina: Secondary | ICD-10-CM | POA: Diagnosis not present

## 2020-06-05 LAB — POCT URINALYSIS DIP (MANUAL ENTRY)
Bilirubin, UA: NEGATIVE
Glucose, UA: 250 mg/dL — AB
Ketones, POC UA: NEGATIVE mg/dL
Leukocytes, UA: NEGATIVE
Nitrite, UA: NEGATIVE
Protein Ur, POC: >=300 mg/dL — AB
Spec Grav, UA: 1.02 (ref 1.010–1.025)
Urobilinogen, UA: 0.2 E.U./dL
pH, UA: 5.5 (ref 5.0–8.0)

## 2020-06-05 MED ORDER — FLUCONAZOLE 200 MG PO TABS: 200.0000 mg | ORAL_TABLET | Freq: Once | ORAL | 0 refills | Status: AC

## 2020-06-05 NOTE — ED Triage Notes (Signed)
Burning with urination, urinating more than usual and vaginal itching.  Pt was abx recently and thinks she may have a yeast infection.

## 2020-06-05 NOTE — ED Provider Notes (Signed)
[facility]  Chief Complaint  Patient presents with  . Vaginitis     SUBJECTIVE:  Susan [name] is a 70 y.o. female who presented to the urgent care for complaint of dysuria and vaginal itching for the past few days.  Reported recent antibiotic use.  Denies flank/abdominal pain.  Has tried OTC medications without relief.  Symptoms are made worse with urination.  Admits to similar symptoms in the past.  Denies fever, chills, nausea, vomiting, abdominal pain, flank pain, abnormal vaginal discharge or bleeding, hematuria.    LMP: No LMP recorded. Patient has had a hysterectomy.  ROS: As in HPI.  All other pertinent ROS negative.     Past Medical History:  Diagnosis Date  . Acute MI ([place]) 1999; 2007  . Anemia    hx  . Anginal pain ([place])   . Anxiety   . ARF (acute renal failure) ([place]) 06/2017    Kidney Asso  . Arthritis    "generalized" (03/15/2014)  . CAD (coronary artery disease)    MI in 2000 - MI  2007 - treated bare metal stent (no nuclear since then as 9/11)  . Carotid artery disease ([place])   . CHF (congestive heart failure) ([place])   . Chronic diastolic heart failure (HCC)    a) ECHO (08/2013) EF 55-60% and RV function nl b) RHC (08/2013) RA 4, RV 30/5/7, PA 25/10 (16), PCWP 7, Fick CO/CI 6.3/2.7, PVR 1.5 WU, PA 61 and 66%  . Daily headache    "~ every other day; since I fell in June" (03/15/2014)  . Depression   . Dyslipidemia   . Dyspnea   . Exertional shortness of breath   . HTN (hypertension)   . Hypothyroidism   . Neuropathy   . Obesity   . Osteoarthritis   . Peripheral neuropathy   . PONV (postoperative nausea and vomiting)   . RBBB (right bundle branch block)    Old  . Stroke [facility])    mini strokes  . Syncope    likely due to low blood sugar  . Tachycardia    Sinus tachycardia  . Type II diabetes mellitus (HCC)    Type II  . Urinary incontinence   . Venous insufficiency    Past Surgical History:  Procedure Laterality Date  . ABDOMINAL  HYSTERECTOMY  1980's  . AMPUTATION Right 02/24/2018   Procedure: RIGHT FOOT GREAT TOE AND 2ND TOE AMPUTATION;  Surgeon: Newt Minion, MD;  Location: [place];  Service: Orthopedics;  Laterality: Right;  . AMPUTATION Right 04/30/2018   Procedure: RIGHT TRANSMETATARSAL AMPUTATION;  Surgeon: Newt Minion, MD;  Location: [place];  Service: Orthopedics;  Laterality: Right;  . BIOPSY  05/27/2020   Procedure: BIOPSY;  Surgeon: [name], DO;  [facility];  Service: Endoscopy;;  . CATARACT EXTRACTION, BILATERAL Bilateral ?2013  . COLONOSCOPY W/ POLYPECTOMY    . COLONOSCOPY WITH PROPOFOL N/A 03/13/2019   Procedure: COLONOSCOPY WITH PROPOFOL;  Surgeon: Jerene Bears, MD;  Location: [place];  Service: Gastroenterology;  Laterality: N/A;  . [place]; 2007   "1 + 1"  . ESOPHAGOGASTRODUODENOSCOPY (EGD) WITH PROPOFOL N/A 03/13/2019   Procedure: ESOPHAGOGASTRODUODENOSCOPY (EGD) WITH PROPOFOL;  Surgeon: Jerene Bears, MD;  Location: [facility] ENDOSCOPY;  Service: Gastroenterology;  Laterality: N/A;  . ESOPHAGOGASTRODUODENOSCOPY (EGD) WITH PROPOFOL N/A 05/27/2020   Procedure: ESOPHAGOGASTRODUODENOSCOPY (EGD) WITH PROPOFOL;  Surgeon: [name], DO;  [facility];  Service: Endoscopy;  Laterality: N/A;  . EYE SURGERY Bilateral    lazer  . HEMOSTASIS CLIP PLACEMENT  03/13/2019   Procedure: HEMOSTASIS CLIP PLACEMENT;  Surgeon: Jerene Bears, MD;  Location: [facility] ENDOSCOPY;  Service: Gastroenterology;;  . KNEE ARTHROSCOPY Left 10/25/2006  . POLYPECTOMY  03/13/2019   Procedure: POLYPECTOMY;  Surgeon: Jerene Bears, MD;  Location: [place];  Service: Gastroenterology;;  . RIGHT HEART CATH N/A 07/24/2017   Procedure: RIGHT HEART CATH;  Surgeon: Jolaine Artist, MD;  Location: [place] CV LAB;  Service: Cardiovascular;  Laterality: N/A;  . RIGHT HEART CATHETERIZATION N/A 09/22/2013   Procedure: RIGHT HEART CATH;  Surgeon: Jolaine Artist,  MD;  Location: [facility] CATH LAB;  Service: Cardiovascular;  Laterality: N/A;  . SHOULDER ARTHROSCOPY WITH OPEN ROTATOR CUFF REPAIR Right 03/14/2014   Procedure: RIGHT SHOULDER ARTHROSCOPY WITH BICEPS RELEASE, OPEN SUBSCAPULA REPAIR, OPEN SUPRASPINATUS REPAIR.;  Surgeon: Meredith Pel, MD;  Location: [place];  Service: Orthopedics;  Laterality: Right;  . TOE AMPUTATION Right 02/24/2018   GREAT TOE AND 2ND TOE AMPUTATION  . TUBAL LIGATION  1970's   Allergies  Allergen Reactions  . Codeine Nausea And Vomiting   No current facility-administered medications on file prior to encounter.   Current Outpatient Medications on File Prior to Encounter  Medication Sig Dispense Refill  . albuterol (VENTOLIN HFA) 108 (90 Base) MCG/ACT inhaler TAKE 2 PUFFS BY MOUTH EVERY 6 HOURS AS NEEDED FOR WHEEZE OR SHORTNESS OF BREATH (Patient taking differently: Inhale 2 puffs into the lungs every 6 (six) hours as needed for wheezing or shortness of breath.) 8.5 g 1  . allopurinol (ZYLOPRIM) 100 MG tablet TAKE 1 TABLET BY MOUTH TWICE A DAY (Patient taking differently: Take 100 mg by mouth 2 (two) times daily.) 180 tablet 4  . Ascorbic Acid (VITAMIN C) 1000 MG tablet Take 1,000 mg by mouth daily.    Marland Kitchen aspirin EC 81 MG tablet Take 1 tablet (81 mg total) by mouth daily with breakfast. 30 tablet 11  . Bismuth Tribromoph-Petrolatum (XEROFORM PETROLAT GAUZE 5"X9") MISC Apply 1 each topically 2 (two) times daily. For dressing changes 50 each 1  . carvedilol (COREG) 12.5 MG tablet Take 1 tablet (12.5 mg total) by mouth 2 (two) times daily with a meal. 180 tablet 1  . cholecalciferol (VITAMIN D) 1000 units tablet Take 1,000 Units by mouth daily with supper.     . clopidogrel (PLAVIX) 75 MG tablet Take 1 tablet (75 mg total) by mouth daily with breakfast. 90 tablet 0  . ferrous sulfate 325 (65 FE) MG tablet Take 1 tablet (325 mg total) by mouth 2 (two) times daily with a meal. Reported on 08/21/2015 60 tablet 2  .  guaiFENesin-dextromethorphan (ROBITUSSIN DM) 100-10 MG/5ML syrup Take 10 mLs by mouth every 4 (four) hours as needed for cough. 118 mL 0  . Insulin Disposable Pump (OMNIPOD DASH 5 PACK PODS) MISC Inject into the skin See admin instructions. Use continuously with Novolin R - change every 72 hours.    . insulin NPH Human (HUMULIN N,NOVOLIN N) 100 UNIT/ML injection Inject 10-15 Units into the skin See admin instructions. Inject 15 units subcutaneously in the morning if CBG >300; inject 10 units 200-300    . insulin regular (NOVOLIN R) 100 units/mL injection Inject into the skin See admin instructions. Manually add bolus to continuous dose via OmniPod 3 times daily per sliding scale (CBG 80-150 7 units, 151-200 9 units, 201-250 12 units, 251-300 14 units, 301- 400 17  units)    . levothyroxine (SYNTHROID, LEVOTHROID) 50 MCG tablet Take 1 tablet (50 mcg total) by mouth daily before breakfast. 90 tablet 1  . magnesium oxide (MAG-OX) 400 MG tablet TAKE 1 TABLET BY MOUTH EVERY DAY (Patient taking differently: Take 400 mg by mouth daily.) 90 tablet 3  . metolazone (ZAROXOLYN) 2.5 MG tablet Take 2.5 mg by mouth See admin instructions. Take 1 tablet by mouth on Monday and Fridays    . nitroGLYCERIN (NITROSTAT) 0.4 MG SL tablet Place 1 tablet (0.4 mg total) under the tongue every 5 (five) minutes as needed for chest pain. (Patient taking differently: Place 0.4 mg under the tongue every 5 (five) minutes as needed for chest pain (max 3 doses).) 25 tablet 1  . Nystatin (GERHARDT'S BUTT CREAM) CREA Apply 1 application topically 3 (three) times daily. Apply Nystatin (Gerhardt's BUTT Cream) to buttock wound 1 each 3  . nystatin (NYSTATIN) powder Apply 1 application topically 3 (three) times daily as needed. 15 g 0  . ondansetron (ZOFRAN) 4 MG tablet Take 1 tablet (4 mg total) by mouth every 8 (eight) hours as needed for nausea or vomiting. 30 tablet 1  . ondansetron (ZOFRAN-ODT) 8 MG disintegrating tablet Take 1 tablet (8  mg total) by mouth 3 (three) times daily. (Patient taking differently: Take 8 mg by mouth 3 (three) times daily as needed for nausea or vomiting.) 20 tablet 0  . pantoprazole (PROTONIX) 40 MG tablet Take 1 tablet (40 mg total) by mouth daily before breakfast. 30 tablet 3  . pregabalin (LYRICA) 150 MG capsule Take 1 capsule (150 mg total) by mouth 2 (two) times daily. 60 capsule 2  . torsemide (DEMADEX) 20 MG tablet Take 2 tablets (40 mg total) by mouth 2 (two) times daily. (Patient taking differently: Take 60 mg by mouth 2 (two) times daily.)    . traZODone (DESYREL) 100 MG tablet TAKE 1 AND 1/2 TABLETS BY MOUTH AT BEDTIME AS NEEDED FOR SLEEP. (Patient taking differently: Take 100 mg by mouth at bedtime.) 135 tablet 1  . ursodiol (ACTIGALL) 500 MG tablet Take 500 mg by mouth 3 (three) times daily.    . vitamin E 1000 UNIT capsule Take 1,000 Units by mouth daily with supper.      Social History   Socioeconomic History  . Marital status: Married    Spouse name: Not on file  . Number of children: 3  . Years of education: 12th  . Highest education level: Not on file  Occupational History    Employer: UNEMPLOYED  Tobacco Use  . Smoking status: Former Smoker    Packs/day: 3.00    Years: 32.00    Pack years: 96.00    Types: Cigarettes    Quit date: 10/24/1997    Years since quitting: 22.6  . Smokeless tobacco: Never Used  Vaping Use  . Vaping Use: Never used  Substance and Sexual Activity  . Alcohol use: Not Currently    Comment: "might have 2-3 daiquiris in the summer"  . Drug use: No  . Sexual activity: Never  Other Topics Concern  . Not on file  Social History Narrative   Pt lives at home with her spouse.   Caffeine Use- 3 sodas daily   Social Determinants of Health   Financial Resource Strain: Not on file  Food Insecurity: Not on file  Transportation Needs: No Transportation Needs  . Lack of Transportation (Medical): No  . Lack of Transportation (Non-Medical): No  Physical  Activity: Inactive  .  Days of Exercise per Week: 0 days  . Minutes of Exercise per Session: 10 min  Stress: Not on file  Social Connections: Not on file  Intimate Partner Violence: Not on file   Family History  Problem Relation Age of Onset  . Heart attack Mother 75    OBJECTIVE:  Vitals:   06/05/20 1311  BP: 133/77  Pulse: 86  Resp: 19  Temp: 98 F (36.7 C)  TempSrc: Oral  SpO2: 91%   General appearance: AOx3 in no acute distress HEENT: NCAT.  Oropharynx clear.  Lungs: clear to auscultation bilaterally without adventitious breath sounds Heart: regular rate and rhythm.  Radial pulses 2+ symmetrical bilaterally Abdomen: soft; non-distended; no tenderness; bowel sounds present; no guarding or rebound tenderness Back: no CVA tenderness Extremities: no edema; symmetrical with no gross deformities Skin: warm and dry Neurologic: Ambulates from chair to exam table without difficulty Psychological: alert and cooperative; normal mood and affect  Labs Reviewed  POCT URINALYSIS DIP (MANUAL ENTRY) - Abnormal; Notable for the following components:      Result Value   Glucose, UA =250 (*)    Blood, UA trace-intact (*)    Protein Ur, POC >=300 (*)    All other components within normal limits  URINE CULTURE  CERVICOVAGINAL ANCILLARY ONLY    ASSESSMENT & PLAN:  1. Vaginal itching   2. Dysuria   3. Vaginal yeast infection     Meds ordered this encounter  Medications  . fluconazole (DIFLUCAN) 200 MG tablet    Sig: Take 1 tablet (200 mg total) by mouth once for 1 dose.    Dispense:  1 tablet    Refill:  0   Discharge instructions  Urine culture sent.  We will call you with abnormal results.   Push fluids and get plenty of rest.   Flagyl was prescribed/take as directed  Follow up with PCP if symptoms persists Return here or go to ER if you have any new or worsening symptoms such as fever, worsening abdominal pain, nausea/vomiting, flank pain, etc...  Outlined signs and  symptoms indicating need for more acute intervention. Patient verbalized understanding. After Visit Summary given.     Emerson Monte, FNP 06/05/20 1418

## 2020-06-05 NOTE — Discharge Instructions (Signed)
Urine culture sent.  We will call you with abnormal results.   Push fluids and get plenty of rest.   Flagyl was prescribed/take as directed  Follow up with PCP if symptoms persists Return here or go to ER if you have any new or worsening symptoms such as fever, worsening abdominal pain, nausea/vomiting, flank pain, etc..Marland Kitchen

## 2020-06-07 ENCOUNTER — Ambulatory Visit (INDEPENDENT_AMBULATORY_CARE_PROVIDER_SITE_OTHER): Payer: HMO | Admitting: Orthopedic Surgery

## 2020-06-07 DIAGNOSIS — L97919 Non-pressure chronic ulcer of unspecified part of right lower leg with unspecified severity: Secondary | ICD-10-CM | POA: Diagnosis not present

## 2020-06-07 DIAGNOSIS — L97929 Non-pressure chronic ulcer of unspecified part of left lower leg with unspecified severity: Secondary | ICD-10-CM | POA: Diagnosis not present

## 2020-06-07 DIAGNOSIS — I87333 Chronic venous hypertension (idiopathic) with ulcer and inflammation of bilateral lower extremity: Secondary | ICD-10-CM | POA: Diagnosis not present

## 2020-06-07 DIAGNOSIS — B351 Tinea unguium: Secondary | ICD-10-CM | POA: Diagnosis not present

## 2020-06-07 LAB — CERVICOVAGINAL ANCILLARY ONLY
Bacterial Vaginitis (gardnerella): NEGATIVE
Candida Glabrata: POSITIVE — AB
Candida Vaginitis: POSITIVE — AB
Chlamydia: NEGATIVE
Comment: NEGATIVE
Comment: NEGATIVE
Comment: NEGATIVE
Comment: NEGATIVE
Comment: NEGATIVE
Comment: NORMAL
Neisseria Gonorrhea: NEGATIVE
Trichomonas: NEGATIVE

## 2020-06-07 LAB — URINE CULTURE
Culture: <10000 — AB
Special Requests: NORMAL

## 2020-06-08 ENCOUNTER — Telehealth: Payer: Self-pay | Admitting: *Deleted

## 2020-06-08 ENCOUNTER — Encounter: Payer: Self-pay | Admitting: Orthopedic Surgery

## 2020-06-08 MED ORDER — NYSTATIN 100000 UNIT/GM EX POWD: 1.0000 "application " | Freq: Three times a day (TID) | CUTANEOUS | 0 refills | Status: DC | PRN

## 2020-06-08 MED ORDER — NYSTATIN 100000 UNIT/GM EX OINT: 1.0000 "application " | TOPICAL_OINTMENT | Freq: Two times a day (BID) | CUTANEOUS | 0 refills | Status: DC

## 2020-06-08 MED ORDER — FLUCONAZOLE 100 MG PO TABS
ORAL_TABLET | ORAL | 0 refills | Status: DC
Start: 2020-06-08 — End: 2020-06-21

## 2020-06-08 NOTE — Telephone Encounter (Signed)
Received call from patient.   Reports that she continues to have increased vaginal itching and irritation.   Nystatin refilled.   Patient was seen in UC and given Diflucan 200mg . Ok to refill?

## 2020-06-08 NOTE — Telephone Encounter (Signed)
I have sent diflucan  100mg  repeat 3 days

## 2020-06-08 NOTE — Telephone Encounter (Signed)
Call placed to patient and patient made aware per VM.  

## 2020-06-08 NOTE — Progress Notes (Signed)
Office Visit Note   Patient: Susan [name]           Date of Birth: 01/07/1951           MRN: [id number] Visit Date: 06/07/2020              Requested by: Alycia Rossetti, MD [address] [place],  [place] [address] PCP: Alycia Rossetti, MD  Chief Complaint  Patient presents with  . Right Foot - Follow-up    Bilateral nail check  . Left Foot - Follow-up      HPI: Patient is a 70 year old woman who is seen in follow-up for venous insufficiency both legs a right transmetatarsal amputation and a onychomycotic nails of the left foot.  Assessment & Plan: Visit Diagnoses:  1. Onychomycosis   2. Idiopathic chronic venous hypertension of both lower extremities with ulcer and inflammation (HCC)     Plan: Nails were trimmed on the left foot she will continue with compression stockings.  Follow-Up Instructions: Return in about 3 months (around 09/05/2020).   Ortho Exam  Patient is alert, oriented, no adenopathy, well-dressed, normal affect, normal respiratory effort. Examination there is venous swelling of both legs but no open ulcers no cellulitis no drainage.  Her right transmetatarsal amputation is stable.  Left foot she has thickened discolored onychomycotic nails x5 no signs of infection no ulcers.  After informed consent the nails were trimmed x5 without complications.  Imaging: No results found. No images are attached to the encounter.  Labs: Lab Results  Component Value Date   HGBA1C 8.7 (H) 05/17/2020   HGBA1C 11.0 (H) 03/09/2020   HGBA1C 11.8 (H) 12/21/2019   CRP 1.5 (H) 03/13/2020   CRP 1.9 (H) 03/12/2020   CRP 3.8 (H) 03/11/2020   LABURIC 9.1 (H) 10/12/2018   LABURIC 9.0 (H) 03/06/2018   REPTSTATUS 06/07/2020 FINAL 06/05/2020   CULT (A) 06/05/2020    <10,000 COLONIES/mL INSIGNIFICANT GROWTH Performed at [facility], [place] [address], [place],  [address]    LABORGA ESCHERICHIA COLI (A) 05/17/2020     Lab Results  Component Value Date    ALBUMIN 2.3 (L) 05/28/2020   ALBUMIN 2.7 (L) 05/27/2020   ALBUMIN 2.6 (L) 05/26/2020   LABURIC 9.1 (H) 10/12/2018   LABURIC 9.0 (H) 03/06/2018    Lab Results  Component Value Date   MG 2.0 05/26/2020   MG 1.8 05/25/2020   MG 2.1 05/18/2020   No results found for: VD25OH  No results found for: PREALBUMIN CBC EXTENDED Latest Ref Rng & Units 05/26/2020 05/25/2020 05/24/2020  WBC 4.0 - 10.5 K/uL 6.0 6.5 6.6  RBC 3.87 - 5.11 MIL/uL 4.18 4.10 4.43  HGB 12.0 - 15.0 g/dL 10.8(L) 10.6(L) 11.4(L)  HCT 36.0 - 46.0 % 35.8(L) 35.4(L) 36.8  PLT 150 - 400 K/uL 321 334 391  NEUTROABS 1.7 - 7.7 K/uL 4.0 - -  LYMPHSABS 0.7 - 4.0 K/uL 0.9 - -     There is no height or weight on file to calculate BMI.  Orders:  No orders of the defined types were placed in this encounter.  No orders of the defined types were placed in this encounter.    Procedures: No procedures performed  Clinical Data: No additional findings.  ROS:  All other systems negative, except as noted in the HPI. Review of Systems  Objective: Vital Signs: There were no vitals taken for this visit.  Specialty Comments:  No specialty comments available.  [place]  History: Patient Active Problem List   Diagnosis Date Noted  . Chronic kidney disease (CKD) 05/25/2020  . Hypoalbuminemia 05/25/2020  . GERD (gastroesophageal reflux disease) 05/25/2020  . Acute exacerbation of CHF (congestive heart failure) ([place]) 05/24/2020  . Intractable nausea and vomiting 05/17/2020  . Pressure injury of skin 05/17/2020  . Acute on chronic diastolic CHF (congestive heart failure) ([place]) 03/07/2020  . Obesity, Class III, BMI 40-49.9 (morbid obesity) ([place]) 03/07/2020  . Enteritis, enteropathogenic E. coli 02/10/2020  . Common bile duct (CBD) obstruction 05/28/2019  . Benign neoplasm of ascending colon   . Benign neoplasm of transverse colon   . Benign neoplasm of descending colon   . Benign neoplasm of sigmoid colon   . Gastric polyps    . Hyperkalemia 03/11/2019  . Prolonged QT interval 03/11/2019  . Onychomycosis 06/21/2018  . Syncope 03/18/2018  . Osteomyelitis of second toe of right foot ([place])   . 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 ([place])   . PVD (peripheral vascular disease) ([place]) 10/26/2017  . Hypothyroidism 07/27/2017  . PAH (pulmonary artery hypertension) ([place])   . Elevated troponin 07/22/2017  . Impaired ambulation 07/19/2017  . Nausea vomiting and diarrhea 07/15/2017  . Leg cramps 02/27/2017  . Peripheral edema 01/12/2017  . Diabetic neuropathy ([place]) 11/12/2016  . 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 mellitus ([place]) 10/24/2015  . Anemia 10/03/2015  . Generalized anxiety disorder 10/03/2015  . Insomnia 10/03/2015  . Chest pain   . Hyperglycemia due to diabetes mellitus ([place]) 06/07/2015  . Chronic diastolic CHF (congestive heart failure) ([place]) 06/07/2015  . Non compliance with medical treatment 04/17/2014  . Rotator cuff tear 03/14/2014  . Class 3 obesity 09/23/2013  . Hypotension 12/25/2012  . Urinary incontinence   . MDD (major depressive disorder) 11/12/2010  . RBBB (right bundle branch block)   . CAD (coronary artery disease)   . Hyperlipemia 01/22/2009  . Essential hypertension 01/22/2009   Past Medical History:  Diagnosis Date  . Acute MI ([place]) 1999; 2007  . Anemia    hx  . Anginal pain ([place])   . Anxiety   . ARF (acute renal failure) ([place]) 06/2017   [facility]  . Arthritis    "generalized" (03/15/2014)  . CAD (coronary artery disease)    MI in 2000 - MI  2007 - treated bare metal stent (no nuclear since then as 9/11)  . Carotid artery disease ([place])   . CHF (congestive heart failure) ([place])   . Chronic diastolic heart failure (HCC)    a) ECHO (08/2013) EF 55-60% and RV function nl b) RHC (08/2013) RA 4, RV 30/5/7, PA 25/10 (16), PCWP 7, Fick CO/CI 6.3/2.7, PVR 1.5 WU, PA 61 and 66%  . Daily headache    "~ every other day; since I fell in June" (03/15/2014)   . Depression   . Dyslipidemia   . Dyspnea   . Exertional shortness of breath   . HTN (hypertension)   . Hypothyroidism   . Neuropathy   . Obesity   . Osteoarthritis   . Peripheral neuropathy   . PONV (postoperative nausea and vomiting)   . RBBB (right bundle branch block)    Old  . Stroke [facility])    mini strokes  . Syncope    likely due to low blood sugar  . Tachycardia    Sinus tachycardia  . Type II diabetes mellitus (HCC)    Type II  . Urinary incontinence   .  Venous insufficiency     Family History  Problem Relation Age of Onset  . Heart attack Mother 4    Past Surgical History:  Procedure Laterality Date  . ABDOMINAL HYSTERECTOMY  1980's  . AMPUTATION Right 02/24/2018   Procedure: RIGHT FOOT GREAT TOE AND 2ND TOE AMPUTATION;  Surgeon: Newt Minion, MD;  Location: [place];  Service: Orthopedics;  Laterality: Right;  . AMPUTATION Right 04/30/2018   Procedure: RIGHT TRANSMETATARSAL AMPUTATION;  Surgeon: Newt Minion, MD;  Location: [place];  Service: Orthopedics;  Laterality: Right;  . BIOPSY  05/27/2020   Procedure: BIOPSY;  Surgeon: [name], DO;  [facility];  Service: Endoscopy;;  . CATARACT EXTRACTION, BILATERAL Bilateral ?2013  . COLONOSCOPY W/ POLYPECTOMY    . COLONOSCOPY WITH PROPOFOL N/A 03/13/2019   Procedure: COLONOSCOPY WITH PROPOFOL;  Surgeon: Jerene Bears, MD;  Location: [place];  Service: Gastroenterology;  Laterality: N/A;  . [place]; 2007   "1 + 1"  . ESOPHAGOGASTRODUODENOSCOPY (EGD) WITH PROPOFOL N/A 03/13/2019   Procedure: ESOPHAGOGASTRODUODENOSCOPY (EGD) WITH PROPOFOL;  Surgeon: Jerene Bears, MD;  Location: [facility] ENDOSCOPY;  Service: Gastroenterology;  Laterality: N/A;  . ESOPHAGOGASTRODUODENOSCOPY (EGD) WITH PROPOFOL N/A 05/27/2020   Procedure: ESOPHAGOGASTRODUODENOSCOPY (EGD) WITH PROPOFOL;  Surgeon: [name], DO;  [facility];  Service: Endoscopy;  Laterality: N/A;   . EYE SURGERY Bilateral    lazer  . HEMOSTASIS CLIP PLACEMENT  03/13/2019   Procedure: HEMOSTASIS CLIP PLACEMENT;  Surgeon: Jerene Bears, MD;  Location: [facility] ENDOSCOPY;  Service: Gastroenterology;;  . KNEE ARTHROSCOPY Left 10/25/2006  . POLYPECTOMY  03/13/2019   Procedure: POLYPECTOMY;  Surgeon: Jerene Bears, MD;  Location: [place];  Service: Gastroenterology;;  . RIGHT HEART CATH N/A 07/24/2017   Procedure: RIGHT HEART CATH;  Surgeon: Jolaine Artist, MD;  Location: [place] CV LAB;  Service: Cardiovascular;  Laterality: N/A;  . RIGHT HEART CATHETERIZATION N/A 09/22/2013   Procedure: RIGHT HEART CATH;  Surgeon: Jolaine Artist, MD;  Location: [facility] CATH LAB;  Service: Cardiovascular;  Laterality: N/A;  . SHOULDER ARTHROSCOPY WITH OPEN ROTATOR CUFF REPAIR Right 03/14/2014   Procedure: RIGHT SHOULDER ARTHROSCOPY WITH BICEPS RELEASE, OPEN SUBSCAPULA REPAIR, OPEN SUPRASPINATUS REPAIR.;  Surgeon: Meredith Pel, MD;  Location: [place];  Service: Orthopedics;  Laterality: Right;  . TOE AMPUTATION Right 02/24/2018   GREAT TOE AND 2ND TOE AMPUTATION  . TUBAL LIGATION  1970's   Social History   Occupational History    Employer: UNEMPLOYED  Tobacco Use  . Smoking status: Former Smoker    Packs/day: 3.00    Years: 32.00    Pack years: 96.00    Types: Cigarettes    Quit date: 10/24/1997    Years since quitting: 22.6  . Smokeless tobacco: Never Used  Vaping Use  . Vaping Use: Never used  Substance and Sexual Activity  . Alcohol use: Not Currently    Comment: "might have 2-3 daiquiris in the summer"  . Drug use: No  . Sexual activity: Never

## 2020-06-11 ENCOUNTER — Encounter (HOSPITAL_COMMUNITY): Payer: PPO

## 2020-06-15 ENCOUNTER — Ambulatory Visit: Payer: Self-pay | Admitting: Family Medicine

## 2020-06-17 DIAGNOSIS — I5032 Chronic diastolic (congestive) heart failure: Secondary | ICD-10-CM | POA: Diagnosis not present

## 2020-06-20 NOTE — Progress Notes (Signed)
06/21/2020 Susan [name]   1951-01-16  [id number]  Primary Physician Jeanice Lim Velna Hatchet, MD Primary Cardiologist: Arvilla Meres, MD  Electrophysiologist: None   Reason for Visit:  Chronic Diastolic Heart Failure  HPI:  Susan [name] is a 70 y.o. female with a history of RBBB, DM, Hypothyroidish, diastolic heart failure, CAD MI 2000/2007 BMS 2007, TIA, obesity, CKD Stage IV and  Rt transmetatarsal amputation.   Admitted June 1st through June 3rd, 2017 with altered mental status, hypothermia, and weakness. Diuretics initially held and later restarted.   Admitted 2/27-07/28/17 for AKI. Torsemide was held and restarted at 60 mg BID. She had RHC 07/24/17 that showed mild PAH with evidence of RV strain likely due to OHS/OSA. She was discharged to SNF.   Admitted 01/2018 with A/C diastolic HF. Diuresed with IV lasix.   Admitted 10/24-10/27/19 with syncope. She was orthostatic. Had AKI with creatinine up to 3.33 and required IVF. Renal US showed no hydronephrosis. Losartan and Imdur were DC'd. Creatinine 1.76 on day of DC.   Was admitted 1/21 for confusion and abdominal pain. Found to have Klebsiella pneumoniae UTI treated w/ abx. Also found to have elevated LFTs. CT of abdomen showed cholelithiasis. MRCP showed cholelithiasis without evidence of cholecystitis or choledocholithiasis. Ultrasound of abdomen with Doppler showed no acute finding.NO concern for cholangitis. All viral serologies werw negative. Her statin (Crestor) was discontinued. She had outpatient GI f/u and continued working including a liver biopsy 08/11/19. No definite pathologic fibrosis noted in pathology report.   Admitted to [facility] 05/17/20 with N/V/D in the setting of possible gastroparesis.   Admitted to [facility] 05/24/20 with and A/C diastolic heart failure. Diuresed with IV lasix and transitioned to torsemide 80 mg twice a day.   Today she returns for HF follow up.Overall feeling fair. Stressed out about taking care of her  disabled son. SOB with exertion. + Orthopnea. Denies PND.  Appetite ok. Eating fast food. No fever or chills. Not weighing at home. Taking all medications. Lives with her husband and disabled son.    Current Meds  Medication Sig  . albuterol (VENTOLIN HFA) 108 (90 Base) MCG/ACT inhaler TAKE 2 PUFFS BY MOUTH EVERY 6 HOURS AS NEEDED FOR WHEEZE OR SHORTNESS OF BREATH  . allopurinol (ZYLOPRIM) 100 MG tablet TAKE 1 TABLET BY MOUTH TWICE A DAY  . Ascorbic Acid (VITAMIN C) 1000 MG tablet Take 1,000 mg by mouth daily.  Marland Kitchen aspirin EC 81 MG tablet Take 1 tablet (81 mg total) by mouth daily with breakfast.  . Bismuth Tribromoph-Petrolatum (XEROFORM PETROLAT GAUZE 5"X9") MISC Apply 1 each topically 2 (two) times daily. For dressing changes  . carvedilol (COREG) 12.5 MG tablet Take 1 tablet (12.5 mg total) by mouth 2 (two) times daily with a meal.  . cholecalciferol (VITAMIN D) 1000 units tablet Take 1,000 Units by mouth daily with supper.   . clopidogrel (PLAVIX) 75 MG tablet Take 1 tablet (75 mg total) by mouth daily with breakfast.  . ferrous sulfate 325 (65 FE) MG tablet Take 1 tablet (325 mg total) by mouth 2 (two) times daily with a meal. Reported on 08/21/2015  . guaiFENesin-dextromethorphan (ROBITUSSIN DM) 100-10 MG/5ML syrup Take 10 mLs by mouth every 4 (four) hours as needed for cough.  . Insulin Disposable Pump (OMNIPOD DASH 5 PACK PODS) MISC Inject into the skin See admin instructions. Use continuously with Novolin R - change every 72 hours.  . insulin NPH Human (HUMULIN N,NOVOLIN N) 100 UNIT/ML injection Inject 10-15 Units into  the skin See admin instructions. Inject 15 units subcutaneously in the morning if CBG >300; inject 10 units 200-300  . insulin regular (NOVOLIN R) 100 units/mL injection Inject into the skin See admin instructions. Manually add bolus to continuous dose via OmniPod 3 times daily per sliding scale (CBG 80-150 7 units, 151-200 9 units, 201-250 12 units, 251-300 14 units, 301- 400  17 units)  . levothyroxine (SYNTHROID, LEVOTHROID) 50 MCG tablet Take 1 tablet (50 mcg total) by mouth daily before breakfast.  . magnesium oxide (MAG-OX) 400 MG tablet TAKE 1 TABLET BY MOUTH EVERY DAY  . metolazone (ZAROXOLYN) 2.5 MG tablet Take 2.5 mg by mouth See admin instructions. Take 1 tablet by mouth on Monday and Fridays  . nitroGLYCERIN (NITROSTAT) 0.4 MG SL tablet Place 1 tablet (0.4 mg total) under the tongue every 5 (five) minutes as needed for chest pain.  Marland Kitchen Nystatin (GERHARDT'S BUTT CREAM) CREA Apply 1 application topically 3 (three) times daily. Apply Nystatin (Gerhardt's BUTT Cream) to buttock wound  . nystatin (NYSTATIN) powder Apply 1 application topically 3 (three) times daily as needed.  . nystatin ointment (MYCOSTATIN) Apply 1 application topically 2 (two) times daily.  . ondansetron (ZOFRAN) 4 MG tablet Take 1 tablet (4 mg total) by mouth every 8 (eight) hours as needed for nausea or vomiting.  . ondansetron (ZOFRAN-ODT) 8 MG disintegrating tablet Take 1 tablet (8 mg total) by mouth 3 (three) times daily.  . pantoprazole (PROTONIX) 40 MG tablet Take 1 tablet (40 mg total) by mouth daily before breakfast.  . pregabalin (LYRICA) 150 MG capsule Take 1 capsule (150 mg total) by mouth 2 (two) times daily.  Marland Kitchen torsemide (DEMADEX) 20 MG tablet Take 80 mg by mouth 2 (two) times daily.  . traZODone (DESYREL) 100 MG tablet TAKE 1 AND 1/2 TABLETS BY MOUTH AT BEDTIME AS NEEDED FOR SLEEP.  Marland Kitchen ursodiol (ACTIGALL) 500 MG tablet Take 500 mg by mouth 3 (three) times daily.  . vitamin E 1000 UNIT capsule Take 1,000 Units by mouth daily with supper.    Allergies  Allergen Reactions  . Codeine Nausea And Vomiting   Past Medical History:  Diagnosis Date  . Acute MI (HCC) 1999; 2007  . Anemia    hx  . Anginal pain (HCC)   . Anxiety   . ARF (acute renal failure) (HCC) 06/2017   [facility]  . Arthritis    "generalized" (03/15/2014)  . CAD (coronary artery disease)    MI in  2000 - MI  2007 - treated bare metal stent (no nuclear since then as 9/11)  . Carotid artery disease (HCC)   . CHF (congestive heart failure) (HCC)   . Chronic diastolic heart failure (HCC)    a) ECHO (08/2013) EF 55-60% and RV function nl b) RHC (08/2013) RA 4, RV 30/5/7, PA 25/10 (16), PCWP 7, Fick CO/CI 6.3/2.7, PVR 1.5 WU, PA 61 and 66%  . Daily headache    "~ every other day; since I fell in June" (03/15/2014)  . Depression   . Dyslipidemia   . Dyspnea   . Exertional shortness of breath   . HTN (hypertension)   . Hypothyroidism   . Neuropathy   . Obesity   . Osteoarthritis   . Peripheral neuropathy   . PONV (postoperative nausea and vomiting)   . RBBB (right bundle branch block)    Old  . Stroke [facility])    mini strokes  . Syncope    likely due to  low blood sugar  . Tachycardia    Sinus tachycardia  . Type II diabetes mellitus (HCC)    Type II  . Urinary incontinence   . Venous insufficiency    Family History  Problem Relation Age of Onset  . Heart attack Mother 51   Past Surgical History:  Procedure Laterality Date  . ABDOMINAL HYSTERECTOMY  1980's  . AMPUTATION Right 02/24/2018   Procedure: RIGHT FOOT GREAT TOE AND 2ND TOE AMPUTATION;  Surgeon: Nadara Mustard, MD;  Location[facility] OR;  Service: Orthopedics;  Laterality: Right;  . AMPUTATION Right 04/30/2018   Procedure: RIGHT TRANSMETATARSAL AMPUTATION;  Surgeon: Nadara Mustard, MD;  Location: [facility] OR;  Service: Orthopedics;  Laterality: Right;  . BIOPSY  05/27/2020   Procedure: BIOPSY;  Surgeon: Lanelle Bal, DO;  [facility];  Service: Endoscopy;;  . CATARACT EXTRACTION, BILATERAL Bilateral ?2013  . COLONOSCOPY W/ POLYPECTOMY    . COLONOSCOPY WITH PROPOFOL N/A 03/13/2019   Procedure: COLONOSCOPY WITH PROPOFOL;  Surgeon: Beverley Fiedler, MD;  Location[facility] ENDOSCOPY;  Service: Gastroenterology;  Laterality: N/A;  . CORONARY ANGIOPLASTY WITH STENT PLACEMENT  1999; 2007   "1 + 1"  . ESOPHAGOGASTRODUODENOSCOPY  (EGD) WITH PROPOFOL N/A 03/13/2019   Procedure: ESOPHAGOGASTRODUODENOSCOPY (EGD) WITH PROPOFOL;  Surgeon: Beverley Fiedler, MD;  Location: [facility] ENDOSCOPY;  Service: Gastroenterology;  Laterality: N/A;  . ESOPHAGOGASTRODUODENOSCOPY (EGD) WITH PROPOFOL N/A 05/27/2020   Procedure: ESOPHAGOGASTRODUODENOSCOPY (EGD) WITH PROPOFOL;  Surgeon: Lanelle Bal, DO;  [facility];  Service: Endoscopy;  Laterality: N/A;  . EYE SURGERY Bilateral    lazer  . HEMOSTASIS CLIP PLACEMENT  03/13/2019   Procedure: HEMOSTASIS CLIP PLACEMENT;  Surgeon: Beverley Fiedler, MD;  Location: [facility] ENDOSCOPY;  Service: Gastroenterology;;  . KNEE ARTHROSCOPY Left 10/25/2006  . POLYPECTOMY  03/13/2019   Procedure: POLYPECTOMY;  Surgeon: Beverley Fiedler, MD;  Location: [facility] ENDOSCOPY;  Service: Gastroenterology;;  . RIGHT HEART CATH N/A 07/24/2017   Procedure: RIGHT HEART CATH;  Surgeon: Dolores Patty, MD;  Location: [facility] INVASIVE CV LAB;  Service: Cardiovascular;  Laterality: N/A;  . RIGHT HEART CATHETERIZATION N/A 09/22/2013   Procedure: RIGHT HEART CATH;  Surgeon: Dolores Patty, MD;  Location: [facility] CATH LAB;  Service: Cardiovascular;  Laterality: N/A;  . SHOULDER ARTHROSCOPY WITH OPEN ROTATOR CUFF REPAIR Right 03/14/2014   Procedure: RIGHT SHOULDER ARTHROSCOPY WITH BICEPS RELEASE, OPEN SUBSCAPULA REPAIR, OPEN SUPRASPINATUS REPAIR.;  Surgeon: Cammy Copa, MD;  Location: [facility] OR;  Service: Orthopedics;  Laterality: Right;  . TOE AMPUTATION Right 02/24/2018   GREAT TOE AND 2ND TOE AMPUTATION  . TUBAL LIGATION  1970's   Social History   Socioeconomic History  . Marital status: Married    Spouse name: Not on file  . Number of children: 3  . Years of education: 12th  . Highest education level: Not on file  Occupational History    Employer: UNEMPLOYED  Tobacco Use  . Smoking status: Former Smoker    Packs/day: 3.00    Years: 32.00    Pack years: 96.00    Types: Cigarettes    Quit date: 10/24/1997    Years since  quitting: 22.6  . Smokeless tobacco: Never Used  Vaping Use  . Vaping Use: Never used  Substance and Sexual Activity  . Alcohol use: Not Currently    Comment: "might have 2-3 daiquiris in the summer"  . Drug use: No  . Sexual activity: Never  Other Topics Concern  .  Not on file  Social History Narrative   Pt lives at home with her spouse.   Caffeine Use- 3 sodas daily   Social Determinants of Health   Financial Resource Strain: Not on file  Food Insecurity: Not on file  Transportation Needs: No Transportation Needs  . Lack of Transportation (Medical): No  . Lack of Transportation (Non-Medical): No  Physical Activity: Inactive  . Days of Exercise per Week: 0 days  . Minutes of Exercise per Session: 10 min  Stress: Not on file  Social Connections: Not on file  Intimate Partner Violence: Not on file     Lipid Panel     Component Value Date/Time   CHOL 242 (H) 12/21/2019 1550   TRIG 125 03/07/2020 1808   HDL 52 12/21/2019 1550   CHOLHDL 4.7 12/21/2019 1550   VLDL 60 (H) 12/21/2019 1550   LDLCALC 130 (H) 12/21/2019 1550   LDLCALC 65 01/17/2019 1608    Blood pressure 130/62, pulse 81, weight 120 kg (264 lb 9.6 oz), SpO2 99 %.  Wt Readings from Last 3 Encounters:  06/21/20 120 kg (264 lb 9.6 oz)  05/26/20 112.5 kg (248 lb 0.3 oz)  05/18/20 114.1 kg (251 lb 8.7 oz)   PHYSICAL EXAM: General:  Tearful. No resp difficulty HEENT: normal Neck: supple. JVP 10-11. Carotids 2+ bilat; no bruits. No lymphadenopathy or thryomegaly appreciated. Cor: PMI nondisplaced. Regular rate & rhythm. No rubs, gallops or murmurs. Lungs: clear Abdomen: soft, nontender, nondistended. No hepatosplenomegaly. No bruits or masses. Good bowel sounds. Extremities: no cyanosis, clubbing, rash, R and LLE 2+ edema Neuro: alert & orientedx3, cranial nerves grossly intact. moves all 4 extremities w/o difficulty. Affect pleasant   ASSESSMENT AND PLAN:   1. Chronic Diastolic Heart Failure: NICM, Echo  06/2017: EF 55-60%, grade 1 DD - Echo 03/19/18: EF 55-60%, grade 1 DD -NYHA III. Volume status elevated in the setting of high sodium diet.  - Continue compression socks.  - Increase torsemide to 100 mg twice a day x 2 days the back to 80 mg twice a day.  - check BMET.     2.  Uncontrolled DM:  - most recent hgb A1c on file was 11  2021 - Follows closely with Dr Lurene Shadow. Has an insulin pump. -  3. CAD: history of CAD with BMS in 2007.  Low risk nuc study in 2018 - No chest pain.  - Continue DAPT w/ ASA + Plavix.  - Continue  blocker therapy w/ Coreg  - off statin due to previously elevated LFTs.  -Most recent CMP 5/21 showed normalization of LFTs  4. Obesity: - Body mass index is 42.71 kg/m.  -Discussed low salt food choices.   6. HTN:  Stable. Continue current regimen.   7. CKD Stage IV - Followed by Dr Signe Colt.  - Check BMET   8. Day time fatigue - suspicion for sleep apnea. Sleep study previously recommended but she refused.  recommendations for sleep study again today and she is still adamant that she dose not want one.   9.  S/p right transmetatarsal amputation - Followed by Dr Lajoyce Corners.   Follow up in 2 weeks to reassess volume status. Referred to HF Paramedicine.  Referred to HFSW for coping strategies due to difficulty managing disabled son.  She is at high risk for readmit given multiple co-morbidities.   Avien Taha NP-C   06/21/2020 3:31 PM

## 2020-06-21 ENCOUNTER — Ambulatory Visit (HOSPITAL_COMMUNITY)
Admission: RE | Admit: 2020-06-21 | Discharge: 2020-06-21 | Disposition: A | Discharge status: Home / Self Care | Payer: HMO | Source: Ambulatory Visit | Attending: Adult Health | Admitting: Adult Health

## 2020-06-21 ENCOUNTER — Other Ambulatory Visit: Payer: Self-pay

## 2020-06-21 ENCOUNTER — Encounter (HOSPITAL_COMMUNITY): Payer: Self-pay

## 2020-06-21 VITALS — BP 130/62 | HR 81 | Wt 264.6 lb

## 2020-06-21 DIAGNOSIS — Z87891 Personal history of nicotine dependence: Secondary | ICD-10-CM | POA: Insufficient documentation

## 2020-06-21 DIAGNOSIS — Z89421 Acquired absence of other right toe(s): Secondary | ICD-10-CM | POA: Diagnosis not present

## 2020-06-21 DIAGNOSIS — N184 Chronic kidney disease, stage 4 (severe): Secondary | ICD-10-CM | POA: Diagnosis not present

## 2020-06-21 DIAGNOSIS — E039 Hypothyroidism, unspecified: Secondary | ICD-10-CM | POA: Insufficient documentation

## 2020-06-21 DIAGNOSIS — E1122 Type 2 diabetes mellitus with diabetic chronic kidney disease: Secondary | ICD-10-CM | POA: Insufficient documentation

## 2020-06-21 DIAGNOSIS — E669 Obesity, unspecified: Secondary | ICD-10-CM | POA: Diagnosis not present

## 2020-06-21 DIAGNOSIS — Z794 Long term (current) use of insulin: Secondary | ICD-10-CM | POA: Diagnosis not present

## 2020-06-21 DIAGNOSIS — Z7982 Long term (current) use of aspirin: Secondary | ICD-10-CM | POA: Diagnosis not present

## 2020-06-21 DIAGNOSIS — R0602 Shortness of breath: Secondary | ICD-10-CM

## 2020-06-21 DIAGNOSIS — Z8673 Personal history of transient ischemic attack (TIA), and cerebral infarction without residual deficits: Secondary | ICD-10-CM | POA: Diagnosis not present

## 2020-06-21 DIAGNOSIS — Z79890 Hormone replacement therapy (postmenopausal): Secondary | ICD-10-CM | POA: Diagnosis not present

## 2020-06-21 DIAGNOSIS — E1165 Type 2 diabetes mellitus with hyperglycemia: Secondary | ICD-10-CM | POA: Diagnosis not present

## 2020-06-21 DIAGNOSIS — Z955 Presence of coronary angioplasty implant and graft: Secondary | ICD-10-CM | POA: Insufficient documentation

## 2020-06-21 DIAGNOSIS — I252 Old myocardial infarction: Secondary | ICD-10-CM | POA: Insufficient documentation

## 2020-06-21 DIAGNOSIS — Z8249 Family history of ischemic heart disease and other diseases of the circulatory system: Secondary | ICD-10-CM | POA: Diagnosis not present

## 2020-06-21 DIAGNOSIS — Z7902 Long term (current) use of antithrombotics/antiplatelets: Secondary | ICD-10-CM | POA: Insufficient documentation

## 2020-06-21 DIAGNOSIS — I428 Other cardiomyopathies: Secondary | ICD-10-CM | POA: Insufficient documentation

## 2020-06-21 DIAGNOSIS — I5032 Chronic diastolic (congestive) heart failure: Secondary | ICD-10-CM | POA: Diagnosis not present

## 2020-06-21 DIAGNOSIS — Z9641 Presence of insulin pump (external) (internal): Secondary | ICD-10-CM | POA: Diagnosis not present

## 2020-06-21 DIAGNOSIS — I2510 Atherosclerotic heart disease of native coronary artery without angina pectoris: Secondary | ICD-10-CM | POA: Diagnosis not present

## 2020-06-21 DIAGNOSIS — I130 Hypertensive heart and chronic kidney disease with heart failure and stage 1 through stage 4 chronic kidney disease, or unspecified chronic kidney disease: Secondary | ICD-10-CM | POA: Insufficient documentation

## 2020-06-21 DIAGNOSIS — Z79899 Other long term (current) drug therapy: Secondary | ICD-10-CM | POA: Diagnosis not present

## 2020-06-21 DIAGNOSIS — Z885 Allergy status to narcotic agent status: Secondary | ICD-10-CM | POA: Insufficient documentation

## 2020-06-21 DIAGNOSIS — Z6841 Body Mass Index (BMI) 40.0 and over, adult: Secondary | ICD-10-CM | POA: Diagnosis not present

## 2020-06-21 LAB — BASIC METABOLIC PANEL
Anion gap: 10 (ref 5–15)
BUN: 59 mg/dL — ABNORMAL HIGH (ref 8–23)
CO2: 23 mmol/L (ref 22–32)
Calcium: 8.7 mg/dL — ABNORMAL LOW (ref 8.9–10.3)
Chloride: 104 mmol/L (ref 98–111)
Creatinine, Ser: 1.93 mg/dL — ABNORMAL HIGH (ref 0.44–1.00)
GFR, Estimated: 27 mL/min — ABNORMAL LOW (ref >60)
Glucose, Bld: 216 mg/dL — ABNORMAL HIGH (ref 70–99)
Potassium: 4 mmol/L (ref 3.5–5.1)
Sodium: 137 mmol/L (ref 135–145)
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
## 2020-06-21 NOTE — Patient Instructions (Signed)
INCREASE Torsemide 100 mg (5 tabs) twice a day for 2 days then 80 mg (4 tabs) twice a day  Labs today We will only contact you if something comes back abnormal or we need to make some changes. Otherwise no news is good news!  Your physician recommends that you schedule a follow-up appointment in: 4 weeks  in the Advanced Practitioners (PA/NP) Clinic

## 2020-06-25 ENCOUNTER — Telehealth (HOSPITAL_COMMUNITY): Payer: Self-pay | Admitting: Licensed Clinical Social Worker

## 2020-06-25 DIAGNOSIS — E039 Hypothyroidism, unspecified: Secondary | ICD-10-CM | POA: Diagnosis not present

## 2020-06-25 DIAGNOSIS — Z9641 Presence of insulin pump (external) (internal): Secondary | ICD-10-CM | POA: Diagnosis not present

## 2020-06-25 DIAGNOSIS — E1165 Type 2 diabetes mellitus with hyperglycemia: Secondary | ICD-10-CM | POA: Diagnosis not present

## 2020-06-25 DIAGNOSIS — R748 Abnormal levels of other serum enzymes: Secondary | ICD-10-CM | POA: Diagnosis not present

## 2020-06-25 DIAGNOSIS — N189 Chronic kidney disease, unspecified: Secondary | ICD-10-CM | POA: Diagnosis not present

## 2020-06-25 DIAGNOSIS — I10 Essential (primary) hypertension: Secondary | ICD-10-CM | POA: Diagnosis not present

## 2020-06-25 DIAGNOSIS — E11319 Type 2 diabetes mellitus with unspecified diabetic retinopathy without macular edema: Secondary | ICD-10-CM | POA: Diagnosis not present

## 2020-06-25 DIAGNOSIS — E11621 Type 2 diabetes mellitus with foot ulcer: Secondary | ICD-10-CM | POA: Diagnosis not present

## 2020-06-25 DIAGNOSIS — G609 Hereditary and idiopathic neuropathy, unspecified: Secondary | ICD-10-CM | POA: Diagnosis not present

## 2020-06-25 DIAGNOSIS — R809 Proteinuria, unspecified: Secondary | ICD-10-CM | POA: Diagnosis not present

## 2020-06-25 NOTE — Telephone Encounter (Signed)
CSW researching possible support for patient and patient son to help at home.  CSW IT consultant of [employer] to see if their insurance benefit could assist in anyway- awaiting return call.  CSW also called pt to check in and see if she had heard regarding moms meals referral- left VM  Will continue to follow and assist as needed  Jorge Ny, [facility] Desk#: [phone] Cell#: [phone]

## 2020-06-26 ENCOUNTER — Telehealth (HOSPITAL_COMMUNITY): Payer: Self-pay | Admitting: Licensed Clinical Social Worker

## 2020-06-26 NOTE — Telephone Encounter (Signed)
CSW received call back from pt.  Pt is agreeable to referral being sent to [facility] to see what case management services might be available to help with pt at home as doing ADLs and completing home tasks has become difficult with her health- pt also has responsibility of helping her son with housework and ADL reminders as he has TBI which has made it hard for him to keep up with these tasks.  Order placed.  CSW continues to look into other TBI support options to assist pt at home. Paramedic will be completing initial home visit later this week so hopeful this will help pt remain at home successfully.  Will continue to follow and assist as needed  Jorge Ny, [facility] Desk#: [phone] Cell#: [phone]

## 2020-06-27 ENCOUNTER — Other Ambulatory Visit (HOSPITAL_COMMUNITY): Payer: Self-pay

## 2020-06-27 NOTE — Progress Notes (Addendum)
Paramedicine Encounter    Patient ID: Susan [name], female    DOB: 1950/05/28, 70 y.o.   MRN: [id number]   Patient Care Team: Alycia Rossetti, MD as PCP - General (Family Medicine) Bensimhon, Shaune Pascal, MD as PCP - Cardiology (Cardiology) Jacelyn Pi, MD (Endocrinology) Hollie Salk Nevin Bloodgood, PA-C (Neurology) Newt Minion, MD as Consulting Physician (Orthopedic Surgery)  Patient Active Problem List   Diagnosis Date Noted  . Chronic kidney disease (CKD) 05/25/2020  . Hypoalbuminemia 05/25/2020  . GERD (gastroesophageal reflux disease) 05/25/2020  . Pressure injury of skin 05/17/2020  . Obesity, Class III, BMI 40-49.9 (morbid obesity) ([place]) 03/07/2020  . Common bile duct (CBD) obstruction 05/28/2019  . Benign neoplasm of ascending colon   . Benign neoplasm of transverse colon   . Benign neoplasm of descending colon   . Benign neoplasm of sigmoid colon   . Gastric polyps   . Prolonged QT interval 03/11/2019  . Onychomycosis 06/21/2018  . Osteomyelitis of second toe of right foot ([place])   . 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 ([place])   . PVD (peripheral vascular disease) ([place]) 10/26/2017  . Hypothyroidism 07/27/2017  . PAH (pulmonary artery hypertension) ([place])   . Impaired ambulation 07/19/2017  . Leg cramps 02/27/2017  . Peripheral edema 01/12/2017  . Diabetic neuropathy ([place]) 11/12/2016  . 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 mellitus ([place]) 10/24/2015  . Anemia 10/03/2015  . Generalized anxiety disorder 10/03/2015  . Insomnia 10/03/2015  . Hyperglycemia due to diabetes mellitus ([place]) 06/07/2015  . Chronic diastolic CHF (congestive heart failure) ([place]) 06/07/2015  . Non compliance with medical treatment 04/17/2014  . Rotator cuff tear 03/14/2014  . Class 3 obesity 09/23/2013  . Hypotension 12/25/2012  . Urinary incontinence   . MDD (major depressive disorder) 11/12/2010  . RBBB (right bundle branch block)   . CAD (coronary artery disease)   .  Hyperlipemia 01/22/2009  . Essential hypertension 01/22/2009    Current Outpatient Medications:  .  albuterol (VENTOLIN HFA) 108 (90 Base) MCG/ACT inhaler, TAKE 2 PUFFS BY MOUTH EVERY 6 HOURS AS NEEDED FOR WHEEZE OR SHORTNESS OF BREATH, Disp: 8.5 g, Rfl: 1 .  allopurinol (ZYLOPRIM) 100 MG tablet, TAKE 1 TABLET BY MOUTH TWICE A DAY, Disp: 180 tablet, Rfl: 4 .  Ascorbic Acid (VITAMIN C) 1000 MG tablet, Take 1,000 mg by mouth daily., Disp: , Rfl:  .  aspirin EC 81 MG tablet, Take 1 tablet (81 mg total) by mouth daily with breakfast., Disp: 30 tablet, Rfl: 11 .  Bismuth Tribromoph-Petrolatum (XEROFORM PETROLAT GAUZE 5"X9") MISC, Apply 1 each topically 2 (two) times daily. For dressing changes, Disp: 50 each, Rfl: 1 .  carvedilol (COREG) 12.5 MG tablet, Take 1 tablet (12.5 mg total) by mouth 2 (two) times daily with a meal., Disp: 180 tablet, Rfl: 1 .  cholecalciferol (VITAMIN D) 1000 units tablet, Take 1,000 Units by mouth daily with supper. , Disp: , Rfl:  .  clopidogrel (PLAVIX) 75 MG tablet, Take 1 tablet (75 mg total) by mouth daily with breakfast., Disp: 90 tablet, Rfl: 0 .  ferrous sulfate 325 (65 FE) MG tablet, Take 1 tablet (325 mg total) by mouth 2 (two) times daily with a meal. Reported on 08/21/2015, Disp: 60 tablet, Rfl: 2 .  guaiFENesin-dextromethorphan (ROBITUSSIN DM) 100-10 MG/5ML syrup, Take 10 mLs by mouth every 4 (four) hours as needed for cough., Disp: 118 mL, Rfl: 0 .  Insulin Disposable Pump (OMNIPOD DASH 5 PACK PODS)  MISC, Inject into the skin See admin instructions. Use continuously with Novolin R - change every 72 hours., Disp: , Rfl:  .  insulin NPH Human (HUMULIN N,NOVOLIN N) 100 UNIT/ML injection, Inject 10-15 Units into the skin See admin instructions. Inject 15 units subcutaneously in the morning if CBG >300; inject 10 units 200-300, Disp: , Rfl:  .  insulin regular (NOVOLIN R) 100 units/mL injection, Inject into the skin See admin instructions. Manually add bolus to  continuous dose via OmniPod 3 times daily per sliding scale (CBG 80-150 7 units, 151-200 9 units, 201-250 12 units, 251-300 14 units, 301- 400 17 units), Disp: , Rfl:  .  levothyroxine (SYNTHROID, LEVOTHROID) 50 MCG tablet, Take 1 tablet (50 mcg total) by mouth daily before breakfast., Disp: 90 tablet, Rfl: 1 .  magnesium oxide (MAG-OX) 400 MG tablet, TAKE 1 TABLET BY MOUTH EVERY DAY, Disp: 90 tablet, Rfl: 3 .  metolazone (ZAROXOLYN) 2.5 MG tablet, Take 2.5 mg by mouth See admin instructions. Take 1 tablet by mouth on Monday and Fridays, Disp: , Rfl:  .  Nystatin (GERHARDT'S BUTT CREAM) CREA, Apply 1 application topically 3 (three) times daily. Apply Nystatin (Gerhardt's BUTT Cream) to buttock wound, Disp: 1 each, Rfl: 3 .  nystatin (NYSTATIN) powder, Apply 1 application topically 3 (three) times daily as needed., Disp: 15 g, Rfl: 0 .  nystatin ointment (MYCOSTATIN), Apply 1 application topically 2 (two) times daily., Disp: 30 g, Rfl: 0 .  pantoprazole (PROTONIX) 40 MG tablet, Take 1 tablet (40 mg total) by mouth daily before breakfast., Disp: 30 tablet, Rfl: 3 .  pregabalin (LYRICA) 150 MG capsule, Take 1 capsule (150 mg total) by mouth 2 (two) times daily., Disp: 60 capsule, Rfl: 2 .  torsemide (DEMADEX) 20 MG tablet, Take 80 mg by mouth 2 (two) times daily., Disp: , Rfl:  .  traZODone (DESYREL) 100 MG tablet, TAKE 1 AND 1/2 TABLETS BY MOUTH AT BEDTIME AS NEEDED FOR SLEEP., Disp: 135 tablet, Rfl: 1 .  ursodiol (ACTIGALL) 500 MG tablet, Take 500 mg by mouth 3 (three) times daily., Disp: , Rfl:  .  vitamin E 1000 UNIT capsule, Take 1,000 Units by mouth daily with supper. , Disp: , Rfl:  .  buPROPion (WELLBUTRIN XL) 150 MG 24 hr tablet, Take 1 tablet (150 mg total) by mouth daily. For depression, Disp: 30 tablet, Rfl: 1 .  cephALEXin (KEFLEX) 500 MG capsule, Take 1 capsule (500 mg total) by mouth 3 (three) times daily., Disp: 15 capsule, Rfl: 0 .  fluconazole (DIFLUCAN) 100 MG tablet, Take 1 tablet  repeat in 3 days, Disp: 2 tablet, Rfl: 0 .  nitroGLYCERIN (NITROSTAT) 0.4 MG SL tablet, Place 1 tablet (0.4 mg total) under the tongue every 5 (five) minutes as needed for chest pain., Disp: 25 tablet, Rfl: 1 .  ondansetron (ZOFRAN) 4 MG tablet, Take 1 tablet (4 mg total) by mouth every 8 (eight) hours as needed for nausea or vomiting., Disp: 30 tablet, Rfl: 1 .  ondansetron (ZOFRAN-ODT) 8 MG disintegrating tablet, Take 1 tablet (8 mg total) by mouth 3 (three) times daily., Disp: 20 tablet, Rfl: 0 Allergies  Allergen Reactions  . Codeine Nausea And Vomiting      Social History   Socioeconomic History  . Marital status: Married    Spouse name: Not on file  . Number of children: 3  . Years of education: 12th  . Highest education level: Not on file  Occupational History    Employer: UNEMPLOYED  Tobacco Use  . Smoking status: Former Smoker    Packs/day: 3.00    Years: 32.00    Pack years: 96.00    Types: Cigarettes    Quit date: 10/24/1997    Years since quitting: 22.7  . Smokeless tobacco: Never Used  Vaping Use  . Vaping Use: Never used  Substance and Sexual Activity  . Alcohol use: Not Currently    Comment: "might have 2-3 daiquiris in the summer"  . Drug use: No  . Sexual activity: Never  Other Topics Concern  . Not on file  Social History Narrative   Pt lives at home with her spouse.   Caffeine Use- 3 sodas daily   Social Determinants of Health   Financial Resource Strain: Low Risk   . Difficulty of Paying Living Expenses: Not very hard  Food Insecurity: No Food Insecurity  . Worried About Charity fundraiser in the Last Year: Never true  . Ran Out of Food in the Last Year: Never true  Transportation Needs: No Transportation Needs  . Lack of Transportation (Medical): No  . Lack of Transportation (Non-Medical): No  Physical Activity: Inactive  . Days of Exercise per Week: 0 days  . Minutes of Exercise per Session: 10 min  Stress: Not on file  Social Connections:  Not on file  Intimate Partner Violence: Not on file    Physical Exam      Future Appointments  Date Time Provider [place]  07/12/2020  1:00 PM Newt Minion, MD [facility] None  07/19/2020  3:00 PM [facility] PA/NP [facility] None    BP (!) 170/92   Pulse 78   Temp 97.7 F (36.5 C)   Resp 20   Wt 262 lb (118.8 kg)   SpO2 98%   BMI 42.29 kg/m  CBG PTA-356 Weight yesterday-? Last visit weight-264  First visit with pt, pt lives at home with her husband and disabled son-TBI.  She also has 2 dogs inside.  Her husband works a lot.  She has a daughter that lives in [place] that she takes her son to stay with for a couple days.   [facility].  She denies any issues with getting her meds.  She sometimes forgets to take her meds, she states not real often.  She said she tries to weigh herself daily.  She needs talking scales to help her maintain her balance while weighing.  I assisted with weighing her today and walking back to the dining table she did get sob.  -still needs sleep study--she is interested in doing the in home sleep study.   She reports her diet is not good.  Snacks on crackers/gelatin during the day.  Drinks way more than 64 oz of fluids a day. We talked about her diet and better choices. She reports she stays thirsty-her cbg's are high. Advised her to get sugar free hard candy.  She got moms meals delivered during our visit.  B/p elevated.  She did not feel a difference of increasing her fluid tablets-she is willing to continue the 100mg  BID UFN though. Will ask amy about that.  -spoke with amy and she can increase to 100mg  BID   Marylouise Stacks, [place] Paramedic  07/02/20

## 2020-06-28 ENCOUNTER — Telehealth (HOSPITAL_COMMUNITY): Payer: Self-pay | Admitting: Licensed Clinical Social Worker

## 2020-06-28 NOTE — Telephone Encounter (Signed)
CSW received call from [facility] regarding referral CSW had placed to [facility] to assist with case management for patient to help with home concerns and help prevent readmissions.  Harland Dingwall works with [employer] chronic special needs program and wanted to call to discuss the patients case and see how we could collaborate to help.  CSW provided Suanne Marker with concerns for patient including being primary caregiver for her adult son with TBI and her own physical limitations at this time due to chronic illnesses and multiple hospital and ED admissions.  Suanne Marker will be reaching out to patient and helping to assess how they can support patient at home.  Will plan to get their social worker, Taniah Reinecke [phone], involved if needed- CSW informed concerns that patient might need caregiver support and some one on one counseling to help with chronic stressors of being caregiver and reportedly low family support.   Suanne Marker also states that pt son is being added to chronic special needs program to help evaluate what services they might be able to connect him with.  CSW also received call from [place], [name] [phone] patient.  They have been consulted to help with multiple readmission concerns as well and have been trying to reach patient since mid December with no luck.  They offer home management of medical concerns to avoid readmissions.  Sounds like services would likely overlap with [facility] goals and abilities so unsure if practical to have both services in the home- CSW will discuss options with patient next time we speak to see how she would like to proceed.  Will continue to follow and assist as needed  Jorge Ny, [facility] Desk#: [phone] Cell#: [phone]

## 2020-06-29 ENCOUNTER — Encounter: Payer: Self-pay | Admitting: Family Medicine

## 2020-06-29 ENCOUNTER — Telehealth: Payer: Self-pay | Admitting: *Deleted

## 2020-06-29 ENCOUNTER — Ambulatory Visit (INDEPENDENT_AMBULATORY_CARE_PROVIDER_SITE_OTHER): Payer: HMO | Admitting: Family Medicine

## 2020-06-29 ENCOUNTER — Other Ambulatory Visit: Payer: Self-pay

## 2020-06-29 VITALS — BP 144/82 | HR 82 | Temp 98.3°F | Resp 14 | Ht 66.0 in | Wt 260.0 lb

## 2020-06-29 DIAGNOSIS — I5032 Chronic diastolic (congestive) heart failure: Secondary | ICD-10-CM

## 2020-06-29 DIAGNOSIS — R252 Cramp and spasm: Secondary | ICD-10-CM

## 2020-06-29 DIAGNOSIS — E039 Hypothyroidism, unspecified: Secondary | ICD-10-CM | POA: Diagnosis not present

## 2020-06-29 DIAGNOSIS — I10 Essential (primary) hypertension: Secondary | ICD-10-CM | POA: Diagnosis not present

## 2020-06-29 DIAGNOSIS — E1122 Type 2 diabetes mellitus with diabetic chronic kidney disease: Secondary | ICD-10-CM

## 2020-06-29 DIAGNOSIS — I739 Peripheral vascular disease, unspecified: Secondary | ICD-10-CM | POA: Diagnosis not present

## 2020-06-29 DIAGNOSIS — N184 Chronic kidney disease, stage 4 (severe): Secondary | ICD-10-CM

## 2020-06-29 DIAGNOSIS — R197 Diarrhea, unspecified: Secondary | ICD-10-CM | POA: Diagnosis not present

## 2020-06-29 DIAGNOSIS — E1142 Type 2 diabetes mellitus with diabetic polyneuropathy: Secondary | ICD-10-CM

## 2020-06-29 NOTE — Telephone Encounter (Signed)
Call placed to patient and patient made aware.   States that she is having increased depression and feels like she needs some type of medication.   Please advise.

## 2020-06-29 NOTE — Assessment & Plan Note (Signed)
>>  ASSESSMENT AND PLAN FOR CHRONIC DIASTOLIC CHF (CONGESTIVE HEART FAILURE) (HCC) WRITTEN ON 06/29/2020  1:27 PM BY [place], KAWANTA F  Does not appear to be fluid overloaded and her weight is actually down 2 pounds.  She was told to increase her diuretic to 100 mg twice a day however she has not started this yet.  I am concerned however her leg cramps are coming from low potassium in the setting of her diuretics and her having diarrhea recently.  She has potassium 20 mEq at home will have her take 1 a day for the next 3 days.  We did attempt to obtain stat labs today CBC and metabolic panel however we were unable to do so.  She is going to hydrate more over the weekend.  Discussed not eating foods with a lot of salt including canned soups.  We will have her try 0 sugar Gatorade or Pedialyte.  He is going to come back next week to have her blood drawn.  At this time she does not have an acute abdomen and her vitals are stable

## 2020-06-29 NOTE — Assessment & Plan Note (Signed)
Does not appear to be fluid overloaded and her weight is actually down 2 pounds.  She was told to increase her diuretic to 100 mg twice a day however she has not started this yet.  I am concerned however her leg cramps are coming from low potassium in the setting of her diuretics and her having diarrhea recently.  She has potassium 20 mEq at home will have her take 1 a day for the next 3 days.  We did attempt to obtain stat labs today CBC and metabolic panel however we were unable to do so.  She is going to hydrate more over the weekend.  Discussed not eating foods with a lot of salt including canned soups.  We will have her try 0 sugar Gatorade or Pedialyte.  He is going to come back next week to have her blood drawn.  At this time she does not have an acute abdomen and her vitals are stable

## 2020-06-29 NOTE — Progress Notes (Signed)
Subjective:    Patient ID: Susan [name], female    DOB: [birth year], 70 y.o.   MRN: [id number]  Patient presents for Illness (X3 days- Watery loose stools (green), weak) and Labs Only (Dr. [name] requesting urine micro)   Patient here follow-up chronic medical problems.  She has been admitted to the hospital multiple times high risk for readmission due to her comorbidities.  SHe follows with multiple specialists.  Diabetes mellitus she is following with endocrinology her last A1c was 8.7%  In December, she is currently on  Insulin States that Dr. [name] did another A1C Monday and it was 10% She also states she requested a urine sample  Chronic renal failure she follows with nephrology but her appt is not until the end of the month   CHF she was seen by cardiology January 27 to establish with the CHF clinic.  She was seen by orthopedics Dr. 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 She has been taking immodium and it has helped. She has only had 1 stool today, ate chicken noodle soup this AM Her other concern is past few days her legs have been cramping  Review Of Systems:  GEN- denies fatigue, fever, weight loss,weakness, recent illness HEENT- denies eye drainage, change in vision, nasal discharge, CVS- denies chest pain, palpitations RESP- denies SOB, cough, wheeze ABD- denies N/V, +change in stools, abd pain GU- denies dysuria, hematuria, dribbling, incontinence MSK- denies joint pain, +muscle aches, injury Neuro- denies headache, dizziness, syncope, seizure activity       Objective:    BP (!) 144/82   Pulse 82   Temp 98.3 F (36.8 C) (Temporal)   Resp 14   Ht 5\' 6"  (1.676 m)   Wt 260 lb (117.9 kg)   SpO2  93%   BMI 41.97 kg/m  GEN- NAD, alert and oriented x3 HEENT- PERRL, EOMI, non injected sclera, pink conjunctiva, MMM, oropharynx clear Neck- Supple, no JVD  CVS- RRR, no murmur RESP-CTAB ABD-NABS,soft,NT,ND EXT-chronic venous statis changes, non pitting edema Pulses- Radial  2+        Assessment & Plan:      Problem List Items Addressed This Visit      Unprioritized   Chronic diastolic CHF (congestive heart failure) (HCC) (Chronic)    Does not appear to be fluid overloaded and her weight is actually down 2 pounds.  She was told to increase her diuretic to 100 mg twice a day however she has not started this yet.  I am concerned however her leg cramps are coming from low potassium in the setting of her diuretics and her having diarrhea recently.  She has potassium 20 mEq at home will have her take 1 a day for the next 3 days.  We did attempt to obtain stat labs today CBC and metabolic panel however we were unable to do so.  She is going to hydrate more over the weekend.  Discussed not eating foods with a lot of salt including canned soups.  We will have her try 0 sugar Gatorade or Pedialyte.  He is going to come back next week to have her blood drawn.  At this time she does not have an acute abdomen and her  vitals are stable      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 mellitus ([place])   Relevant Orders   CBC with Differential/Platelet   Comprehensive metabolic panel   Microalbumin / creatinine urine ratio   Urinalysis, Routine w reflex microscopic   Diabetic neuropathy (HCC)   Essential hypertension - Primary   Hypothyroidism   Relevant Orders   TSH   T3, free   Leg cramps   Relevant Orders   Comprehensive metabolic panel   PVD (peripheral vascular disease) ([place])    Other Visit Diagnoses    Diarrhea, unspecified type       Gastroenteritis.  But not severe and symptoms have improved with Imodium.   Relevant Orders   CBC with Differential/Platelet   Comprehensive metabolic panel       Note: This dictation was prepared with Dragon dictation along with smaller phrase technology. Any transcriptional errors that result from this process are unintentional.

## 2020-06-29 NOTE — Telephone Encounter (Signed)
Since she has been off for the past month do not restart at this time

## 2020-06-29 NOTE — Telephone Encounter (Signed)
Received call from patient.   Reports that she was taking Lexapro previously, but medication was stopped during hospitalization. States that she went to pharmacy to pick up medications and noted Lexapro was in bag.   Per chart notes, Lexapro was stopped on 05/18/2020 for prolonged QT interval  Prolonged QTC -Holding home escitalopram  Please advise.

## 2020-06-29 NOTE — Patient Instructions (Addendum)
Continue immodium as needed Take 1 potassium pill once a day for the next 3 days for the cramps F/U Dr. Dennard Schaumann 3 months - 30 minute slot

## 2020-06-30 LAB — URINALYSIS, ROUTINE W REFLEX MICROSCOPIC
Bilirubin Urine: NEGATIVE
Hyaline Cast: NONE SEEN /LPF
Ketones, ur: NEGATIVE
Nitrite: NEGATIVE
Specific Gravity, Urine: 1.016 (ref 1.001–1.03)
pH: <=5 (ref 5.0–8.0)

## 2020-06-30 LAB — MICROALBUMIN / CREATININE URINE RATIO
Creatinine, Urine: 146 mg/dL (ref 20–275)
Microalb Creat Ratio: 1971 mcg/mg creat — ABNORMAL HIGH (ref <30)
Microalb, Ur: 287.8 mg/dL

## 2020-06-30 MED ORDER — CEPHALEXIN 500 MG PO CAPS: 500.0000 mg | ORAL_CAPSULE | Freq: Three times a day (TID) | ORAL | 0 refills | Status: DC

## 2020-06-30 MED ORDER — FLUCONAZOLE 100 MG PO TABS
ORAL_TABLET | ORAL | 0 refills | Status: DC
Start: 2020-06-30 — End: 2020-07-24

## 2020-06-30 MED ORDER — BUPROPION HCL ER (XL) 150 MG PO TB24: 150.0000 mg | ORAL_TABLET | Freq: Every day | ORAL | 1 refills | Status: DC

## 2020-06-30 NOTE — Telephone Encounter (Signed)
Called pt, will start wellbutrin for depression, this does not have higher risk compared to other meds for prolonged QT, continue trazadone at bedtime  Note her diarrhea resolved, she is hydrating with zero sugar gaterade, but still having leg cramps She forgot to take the potassium  Discussed taking potassium, keep hydrating herself

## 2020-06-30 NOTE — Addendum Note (Signed)
Addended by: Vic Blackbird F on: 06/30/2020 06:32 PM   Modules accepted: Orders

## 2020-07-03 ENCOUNTER — Telehealth (HOSPITAL_COMMUNITY): Payer: Self-pay

## 2020-07-03 NOTE — Telephone Encounter (Signed)
Contacted pt regarding check in for this week-pt reports she is now on an antibiotic for UTI, she is not putting out urine like normal. Her legs are swelling and weeping.  She is weighing daily but the paper is in the back room and she cant get back there to look at it.  She is struggling with her CBG's. Today it was 454. Reviewed things she eats/snacks on daily-she does need to cut back on her snacking as they contain carbs/sugars that she does not need. She said her PCP advised her to drink 0 sugar gatorade and pedialyte for possible dehydration--I advised her to limit that and drink more waters as those electrolyte drinks have lots of sodium.  She also reported wounds on her bottom that is not getting tended to like it needs and it has become quite painful for her.  I have asked clinic to see if they have an opening on Friday for her to be seen. Will await to hear back about the appointment.  She can use [facility] per [place].  She cant go on Thursday due to a GI appointment.   Susan [name], EMT-Paramedic  07/03/20

## 2020-07-04 ENCOUNTER — Telehealth (HOSPITAL_COMMUNITY): Payer: Self-pay | Admitting: Licensed Clinical Social Worker

## 2020-07-04 NOTE — Telephone Encounter (Signed)
Community paramedic requested talking scale for patient as she has trouble looking down when weighing herself without losing her balance.  CSW ordered a talking scale- anticipate delivery tomorrow  CSW also attempted to call pt to check in regarding referrals to [facility] case management to attempt to help with home concerns- phone line keeps saying its busy- will continue to attempt contact.  Will continue to follow and assist as needed  Jorge Ny, [facility] Desk#: [phone] Cell#: [phone]

## 2020-07-05 ENCOUNTER — Telehealth (HOSPITAL_COMMUNITY): Payer: Self-pay

## 2020-07-05 ENCOUNTER — Other Ambulatory Visit: Payer: HMO

## 2020-07-05 ENCOUNTER — Other Ambulatory Visit: Payer: Self-pay

## 2020-07-05 DIAGNOSIS — R197 Diarrhea, unspecified: Secondary | ICD-10-CM | POA: Diagnosis not present

## 2020-07-05 DIAGNOSIS — K8020 Calculus of gallbladder without cholecystitis without obstruction: Secondary | ICD-10-CM | POA: Diagnosis not present

## 2020-07-05 DIAGNOSIS — L299 Pruritus, unspecified: Secondary | ICD-10-CM | POA: Diagnosis not present

## 2020-07-05 DIAGNOSIS — N184 Chronic kidney disease, stage 4 (severe): Secondary | ICD-10-CM | POA: Diagnosis not present

## 2020-07-05 DIAGNOSIS — E1122 Type 2 diabetes mellitus with diabetic chronic kidney disease: Secondary | ICD-10-CM | POA: Diagnosis not present

## 2020-07-05 DIAGNOSIS — K8309 Other cholangitis: Secondary | ICD-10-CM | POA: Diagnosis not present

## 2020-07-05 DIAGNOSIS — K5730 Diverticulosis of large intestine without perforation or abscess without bleeding: Secondary | ICD-10-CM | POA: Diagnosis not present

## 2020-07-05 DIAGNOSIS — E039 Hypothyroidism, unspecified: Secondary | ICD-10-CM | POA: Diagnosis not present

## 2020-07-05 DIAGNOSIS — R252 Cramp and spasm: Secondary | ICD-10-CM | POA: Diagnosis not present

## 2020-07-05 LAB — CBC WITH DIFFERENTIAL/PLATELET
Absolute Monocytes: 356 cells/uL (ref 200–950)
Basophils Absolute: 24 cells/uL (ref 0–200)
Basophils Relative: 0.3 %
Eosinophils Absolute: 798 cells/uL — ABNORMAL HIGH (ref 15–500)
Eosinophils Relative: 10.1 %
HCT: 35.5 % (ref 35.0–45.0)
Hemoglobin: 11 g/dL — ABNORMAL LOW (ref 11.7–15.5)
Lymphs Abs: 656 cells/uL — ABNORMAL LOW (ref 850–3900)
MCH: 26 pg — ABNORMAL LOW (ref 27.0–33.0)
MCHC: 31 g/dL — ABNORMAL LOW (ref 32.0–36.0)
MCV: 83.9 fL (ref 80.0–100.0)
MPV: 12.1 fL (ref 7.5–12.5)
Monocytes Relative: 4.5 %
Neutro Abs: 6067 cells/uL (ref 1500–7800)
Neutrophils Relative %: 76.8 %
Platelets: 224 10*3/uL (ref 140–400)
RBC: 4.23 10*6/uL (ref 3.80–5.10)
RDW: 16.2 % — ABNORMAL HIGH (ref 11.0–15.0)
Total Lymphocyte: 8.3 %
WBC: 7.9 10*3/uL (ref 3.8–10.8)

## 2020-07-05 LAB — COMPREHENSIVE METABOLIC PANEL
AG Ratio: 1.2 (calc) (ref 1.0–2.5)
ALT: 19 U/L (ref 6–29)
AST: 14 U/L (ref 10–35)
Albumin: 3.4 g/dL — ABNORMAL LOW (ref 3.6–5.1)
Alkaline phosphatase (APISO): 182 U/L — ABNORMAL HIGH (ref 37–153)
BUN/Creatinine Ratio: 30 (calc) — ABNORMAL HIGH (ref 6–22)
BUN: 71 mg/dL — ABNORMAL HIGH (ref 7–25)
CO2: 30 mmol/L (ref 20–32)
Calcium: 9.1 mg/dL (ref 8.6–10.4)
Chloride: 97 mmol/L — ABNORMAL LOW (ref 98–110)
Creat: 2.39 mg/dL — ABNORMAL HIGH (ref 0.60–0.93)
Globulin: 2.8 g/dL (calc) (ref 1.9–3.7)
Glucose, Bld: 385 mg/dL — ABNORMAL HIGH (ref 65–99)
Potassium: 5.2 mmol/L (ref 3.5–5.3)
Sodium: 136 mmol/L (ref 135–146)
Total Bilirubin: 0.4 mg/dL (ref 0.2–1.2)
Total Protein: 6.2 g/dL (ref 6.1–8.1)

## 2020-07-05 LAB — T3, FREE: T3, Free: 2.7 pg/mL (ref 2.3–4.2)

## 2020-07-05 LAB — TSH: TSH: 3.69 mIU/L (ref 0.40–4.50)

## 2020-07-05 NOTE — Telephone Encounter (Signed)
Per proficient "Rejection Reason - Patient was No Show"

## 2020-07-05 NOTE — Progress Notes (Addendum)
Advanced Heart Failure Clinic Note   Primary Physician [name], [name], MD HF Cardiologist: Glori Bickers, MD   Reason for Visit:  Chronic Diastolic Heart Failure  HPI:  Susan [name] is a 70 y.o. female with a history of RBBB, DM, Hypothyroidish, diastolic heart failure, CAD MI 2000/2007 BMS 2007, TIA, obesity, CKD Stage IV and  Rt transmetatarsal amputation.   Admitted June 1st through June 3rd, 2017 with altered mental status, hypothermia, and weakness. Diuretics initially held and later restarted.   Admitted 2/27-07/28/17 for AKI. Torsemide was held and restarted at 60 mg BID. She had RHC 07/24/17 that showed mild PAH with evidence of RV strain likely due to OHS/OSA. She was discharged to SNF.   Admitted 12/7562 with A/C diastolic HF. Diuresed with IV lasix.   Admitted 10/24-10/27/19 with syncope. She was orthostatic. Had AKI with creatinine up to 3.33 and required IVF. Renal US showed no hydronephrosis. Losartan and Imdur were DC'd. Creatinine 1.76 on day of DC.   Was admitted 1/21 for confusion and abdominal pain. Found to have Klebsiella pneumoniae UTI treated w/ abx. Also with elevated LFTs. CT of abdomen showed cholelithiasis. MRCP showed cholelithiasis without evidence of cholecystitis or choledocholithiasis. Abdominal US with doppler showed no acute finding.No concern for cholangitis. All viral serologies negative. Her Crestor was discontinued. She had outpatient GI f/u and continued working including a liver biopsy 08/11/19. No definite pathologic fibrosis noted in pathology report.   Admitted to [facility] 05/17/20 with N/V/D in the setting of possible gastroparesis.   Admitted to [facility] 05/24/20 with and A/C diastolic heart failure. Diuresed with IV lasix and transitioned to torsemide 80 mg twice a day.   She returned for HF follow up 1/22. Stressed out about taking care of her disabled son. SOB with exertion. + Orthopnea. Eating fast food. Not weighing at home. Taking all  medications. Lives with her husband and disabled son. She was instructed to increase torsemide to 100 mg bid x 2 days, then back down to 80 mg bid.  Today she returns for HF follow up. Overall feeling the same, breathing is at baseline. Denies increasing SOB, CP, dizziness, edema, or PND/Orthopnea. Appetite ok. No fever or chills. Weight at home ~ 262-264 pounds. Taking all medications. Wears 2L oxygen at night. Does not take metolazone currently. She never decreased her torsemide back to home dose of 80 mg bid, currently still taking 100 mg bid.  Cardiac Studies: Echo (9/21): EF 55-60%, RV ok Echo (10/19): EF 55-60%, Grade I DD  RHC (07/24/17): showed mild PAH with evidence of RV strain likely due to OHS/OSA.  Findings: RA = 17 RV = 48/15 PA = 49/11 (30) PCW = 18 Fick cardiac output/index = 7.0/3.0 PVR = 0.5 WU Ao sat = 97% PA sat = 62%, 64% Assessment: 1. Normal left-sided pressures 2. Mild PAH with evidence of RV strain likely due to OHS/OSA 3. Normal cardiac output  Lexiscan w/ stress echo (2018): EF 68%, low risk  LHC (2007): BMS distal RCA Cardiolite (03/2002): EF of 51% but no ischemia LHC (2000): BMS RCA   ROS: All systems reviewed and negative except as per HPI.   Current Meds  Medication Sig  . albuterol (VENTOLIN HFA) 108 (90 Base) MCG/ACT inhaler TAKE 2 PUFFS BY MOUTH EVERY 6 HOURS AS NEEDED FOR WHEEZE OR SHORTNESS OF BREATH  . allopurinol (ZYLOPRIM) 100 MG tablet TAKE 1 TABLET BY MOUTH TWICE A DAY  . Ascorbic Acid (VITAMIN C) 1000 MG tablet Take  1,000 mg by mouth daily.  Marland Kitchen aspirin EC 81 MG tablet Take 1 tablet (81 mg total) by mouth daily with breakfast.  . Bismuth Tribromoph-Petrolatum (XEROFORM PETROLAT GAUZE 5"X9") MISC Apply 1 each topically 2 (two) times daily. For dressing changes  . buPROPion (WELLBUTRIN XL) 150 MG 24 hr tablet Take 1 tablet (150 mg total) by mouth daily. For depression  . carvedilol (COREG) 12.5 MG tablet Take 1 tablet (12.5 mg total) by  mouth 2 (two) times daily with a meal.  . cephALEXin (KEFLEX) 500 MG capsule Take 1 capsule (500 mg total) by mouth 3 (three) times daily.  . cholecalciferol (VITAMIN D) 1000 units tablet Take 1,000 Units by mouth daily with supper.   . clopidogrel (PLAVIX) 75 MG tablet Take 1 tablet (75 mg total) by mouth daily with breakfast.  . ferrous sulfate 325 (65 FE) MG tablet Take 1 tablet (325 mg total) by mouth 2 (two) times daily with a meal. Reported on 08/21/2015  . fluconazole (DIFLUCAN) 100 MG tablet Take 1 tablet repeat in 3 days  . guaiFENesin-dextromethorphan (ROBITUSSIN DM) 100-10 MG/5ML syrup Take 10 mLs by mouth every 4 (four) hours as needed for cough.  . Insulin Disposable Pump (OMNIPOD DASH 5 PACK PODS) MISC Inject into the skin See admin instructions. Use continuously with Novolin R - change every 72 hours.  . insulin NPH Human (HUMULIN N,NOVOLIN N) 100 UNIT/ML injection Inject 10-15 Units into the skin See admin instructions. Inject 15 units subcutaneously in the morning if CBG >300; inject 10 units 200-300  . insulin regular (NOVOLIN R) 100 units/mL injection Inject into the skin See admin instructions. Manually add bolus to continuous dose via OmniPod 3 times daily per sliding scale (CBG 80-150 7 units, 151-200 9 units, 201-250 12 units, 251-300 14 units, 301- 400 17 units)  . levothyroxine (SYNTHROID, LEVOTHROID) 50 MCG tablet Take 1 tablet (50 mcg total) by mouth daily before breakfast.  . magnesium oxide (MAG-OX) 400 MG tablet TAKE 1 TABLET BY MOUTH EVERY DAY  . metolazone (ZAROXOLYN) 2.5 MG tablet Take 2.5 mg by mouth See admin instructions. Take 1 tablet by mouth on Monday and Fridays  . nitroGLYCERIN (NITROSTAT) 0.4 MG SL tablet Place 1 tablet (0.4 mg total) under the tongue every 5 (five) minutes as needed for chest pain.  Marland Kitchen Nystatin (GERHARDT'S BUTT CREAM) CREA Apply 1 application topically 3 (three) times daily. Apply Nystatin (Gerhardt's BUTT Cream) to buttock wound  . nystatin  (NYSTATIN) powder Apply 1 application topically 3 (three) times daily as needed.  . nystatin ointment (MYCOSTATIN) Apply 1 application topically 2 (two) times daily.  . ondansetron (ZOFRAN) 4 MG tablet Take 1 tablet (4 mg total) by mouth every 8 (eight) hours as needed for nausea or vomiting.  . ondansetron (ZOFRAN-ODT) 8 MG disintegrating tablet Take 1 tablet (8 mg total) by mouth 3 (three) times daily.  . pantoprazole (PROTONIX) 40 MG tablet Take 1 tablet (40 mg total) by mouth daily before breakfast.  . pregabalin (LYRICA) 150 MG capsule Take 1 capsule (150 mg total) by mouth 2 (two) times daily.  . traZODone (DESYREL) 100 MG tablet TAKE 1 AND 1/2 TABLETS BY MOUTH AT BEDTIME AS NEEDED FOR SLEEP.  Marland Kitchen ursodiol (ACTIGALL) 500 MG tablet Take 500 mg by mouth 3 (three) times daily.  . vitamin E 1000 UNIT capsule Take 1,000 Units by mouth daily with supper.   . [DISCONTINUED] torsemide (DEMADEX) 20 MG tablet Take 80 mg by mouth 2 (two) times daily.  Allergies  Allergen Reactions  . Codeine Nausea And Vomiting   Past Medical History:  Diagnosis Date  . Acute MI ([place]) 1999; 2007  . Anemia    hx  . Anginal pain ([place])   . Anxiety   . ARF (acute renal failure) ([place]) 06/2017   [place] Kidney Asso  . Arthritis    "generalized" (03/15/2014)  . CAD (coronary artery disease)    MI in 2000 - MI  2007 - treated bare metal stent (no nuclear since then as 9/11)  . Carotid artery disease ([place])   . CHF (congestive heart failure) ([place])   . Chronic diastolic heart failure (HCC)    a) ECHO (08/2013) EF 55-60% and RV function nl b) RHC (08/2013) RA 4, RV 30/5/7, PA 25/10 (16), PCWP 7, Fick CO/CI 6.3/2.7, PVR 1.5 WU, PA 61 and 66%  . Daily headache    "~ every other day; since I fell in June" (03/15/2014)  . Depression   . Dyslipidemia   . Dyspnea   . Exertional shortness of breath   . HTN (hypertension)   . Hypothyroidism   . Neuropathy   . Obesity   . Osteoarthritis   . Peripheral neuropathy   .  PONV (postoperative nausea and vomiting)   . RBBB (right bundle branch block)    Old  . Stroke [facility])    mini strokes  . Syncope    likely due to low blood sugar  . Tachycardia    Sinus tachycardia  . Type II diabetes mellitus (HCC)    Type II  . Urinary incontinence   . Venous insufficiency    Family History  Problem Relation Age of Onset  . Heart attack Mother 37   Past Surgical History:  Procedure Laterality Date  . ABDOMINAL HYSTERECTOMY  1980's  . AMPUTATION Right 02/24/2018   Procedure: RIGHT FOOT GREAT TOE AND 2ND TOE AMPUTATION;  Surgeon: Newt Minion, MD;  Location: [place];  Service: Orthopedics;  Laterality: Right;  . AMPUTATION Right 04/30/2018   Procedure: RIGHT TRANSMETATARSAL AMPUTATION;  Surgeon: Newt Minion, MD;  Location: [place];  Service: Orthopedics;  Laterality: Right;  . BIOPSY  05/27/2020   Procedure: BIOPSY;  Surgeon: [name], DO;  [facility];  Service: Endoscopy;;  . CATARACT EXTRACTION, BILATERAL Bilateral ?2013  . COLONOSCOPY W/ POLYPECTOMY    . COLONOSCOPY WITH PROPOFOL N/A 03/13/2019   Procedure: COLONOSCOPY WITH PROPOFOL;  Surgeon: Jerene Bears, MD;  Location: [place];  Service: Gastroenterology;  Laterality: N/A;  . [place]; 2007   "1 + 1"  . ESOPHAGOGASTRODUODENOSCOPY (EGD) WITH PROPOFOL N/A 03/13/2019   Procedure: ESOPHAGOGASTRODUODENOSCOPY (EGD) WITH PROPOFOL;  Surgeon: Jerene Bears, MD;  Location: [facility] ENDOSCOPY;  Service: Gastroenterology;  Laterality: N/A;  . ESOPHAGOGASTRODUODENOSCOPY (EGD) WITH PROPOFOL N/A 05/27/2020   Procedure: ESOPHAGOGASTRODUODENOSCOPY (EGD) WITH PROPOFOL;  Surgeon: [name], DO;  [facility];  Service: Endoscopy;  Laterality: N/A;  . EYE SURGERY Bilateral    lazer  . HEMOSTASIS CLIP PLACEMENT  03/13/2019   Procedure: HEMOSTASIS CLIP PLACEMENT;  Surgeon: Jerene Bears, MD;  Location: [facility] ENDOSCOPY;  Service: Gastroenterology;;  . KNEE  ARTHROSCOPY Left 10/25/2006  . POLYPECTOMY  03/13/2019   Procedure: POLYPECTOMY;  Surgeon: Jerene Bears, MD;  Location: [facility] ENDOSCOPY;  Service: Gastroenterology;;  . RIGHT HEART CATH N/A 07/24/2017   Procedure: RIGHT HEART CATH;  Surgeon: Jolaine Artist, MD;  Location: [place]  CV LAB;  Service: Cardiovascular;  Laterality: N/A;  . RIGHT HEART CATHETERIZATION N/A 09/22/2013   Procedure: RIGHT HEART CATH;  Surgeon: Jolaine Artist, MD;  Location: [facility] CATH LAB;  Service: Cardiovascular;  Laterality: N/A;  . SHOULDER ARTHROSCOPY WITH OPEN ROTATOR CUFF REPAIR Right 03/14/2014   Procedure: RIGHT SHOULDER ARTHROSCOPY WITH BICEPS RELEASE, OPEN SUBSCAPULA REPAIR, OPEN SUPRASPINATUS REPAIR.;  Surgeon: Meredith Pel, MD;  Location: [place];  Service: Orthopedics;  Laterality: Right;  . TOE AMPUTATION Right 02/24/2018   GREAT TOE AND 2ND TOE AMPUTATION  . TUBAL LIGATION  1970's   Social History   Socioeconomic History  . Marital status: Married    Spouse name: Not on file  . Number of children: 3  . Years of education: 12th  . Highest education level: Not on file  Occupational History    Employer: UNEMPLOYED  Tobacco Use  . Smoking status: Former Smoker    Packs/day: 3.00    Years: 32.00    Pack years: 96.00    Types: Cigarettes    Quit date: 10/24/1997    Years since quitting: 22.7  . Smokeless tobacco: Never Used  Vaping Use  . Vaping Use: Never used  Substance and Sexual Activity  . Alcohol use: Not Currently    Comment: "might have 2-3 daiquiris in the summer"  . Drug use: No  . Sexual activity: Never  Other Topics Concern  . Not on file  Social History Narrative   Pt lives at home with her spouse.   Caffeine Use- 3 sodas daily   Social Determinants of Health   Financial Resource Strain: Low Risk   . Difficulty of Paying Living Expenses: Not very hard  Food Insecurity: No Food Insecurity  . Worried About Charity fundraiser in the Last Year: Never true  . Ran Out  of Food in the Last Year: Never true  Transportation Needs: No Transportation Needs  . Lack of Transportation (Medical): No  . Lack of Transportation (Non-Medical): No  Physical Activity: Inactive  . Days of Exercise per Week: 0 days  . Minutes of Exercise per Session: 10 min  Stress: Not on file  Social Connections: Not on file  Intimate Partner Violence: Not on file   Lipid Panel     Component Value Date/Time   CHOL 242 (H) 12/21/2019 1550   TRIG 125 03/07/2020 1808   HDL 52 12/21/2019 1550   CHOLHDL 4.7 12/21/2019 1550   VLDL 60 (H) 12/21/2019 1550   LDLCALC 130 (H) 12/21/2019 1550   LDLCALC 65 01/17/2019 1608    Blood pressure (!) 146/88, pulse 81, weight 115.7 kg (255 lb), SpO2 99 %.  Wt Readings from Last 3 Encounters:  07/06/20 115.7 kg (255 lb)  06/29/20 117.9 kg (260 lb)  06/27/20 118.8 kg (262 lb)   PHYSICAL EXAM: General:  NAD. No resp difficulty HEENT: Normal Neck: Supple. No JVD. Carotids 2+ bilat; no bruits. No lymphadenopathy or thryomegaly appreciated. Cor: PMI nondisplaced. Regular rate & rhythm. No rubs, gallops or murmurs. Lungs: Clear Abdomen: Obese, soft, nontender, nondistended. No hepatosplenomegaly. No bruits or masses. Good bowel sounds. Extremities: No cyanosis, clubbing, rash, 1+ bilateral LE edema Neuro: alert & oriented x 3, cranial nerves grossly intact. Moves all 4 extremities w/o difficulty. Affect pleasant.  ReDs: 30%  ASSESSMENT AND PLAN:   1. Chronic Diastolic Heart Failure: NICM, Echo 06/2017: EF 55-60%, grade 1 DD - Echo 03/19/18: EF 55-60%, grade 1 DD - NYHA III-IIIb, functional capacity limited  by body habitus and bilateral knee pain. Volume status good on exam, weight down 5 lbs & Reds 30%. - Creatinine 2.39 on labs from PCP yesterday, decrease torsemide to 80 mg daily x 3 days then back to 80 mg bid on Monday.  - Has not been taking metolazone (previously on metolazone 2.5 mg on Mondays/Fridays). Continue to hold with worsening  SCr. - Continue carvedilol 12.5 mg bid. - Continue compression socks.  - BMET 7-10 days.    2.  Uncontrolled DM:  - Recent hgb A1c( 2021) 11  - Follows closely with Dr. Bubba Camp. Has an insulin pump.  3. CAD: history of CAD with BMS in 2007.  Low risk nuc study in 2018 - No chest pain.  - Continue DAPT w/ ASA + Plavix.  - Continue  blocker therapy. - Off statin due to previously elevated LFTs.  - CMET 2/22 showed normalization of LFTs.  4. Morbid Obesity: - Body mass index is 41.16 kg/m.  - Discussed low salt food choices.  - Encouraged portion control and increase physical activity as able.  6. HTN:  - Elevated today. Check BP at home and bring in log. - If still elevated at next appt, consider amlodipine. - Continue current regimen.   7. CKD Stage IV - Followed by Dr. Hollie Salk.  - Baseline creatinine ~1.5-1.9 - BMET 7-10 days. Adjust torsemide as above.  8. Daytime fatigue - Suspicion for sleep apnea.  - She is agreeable to home sleep study (she does not have her phone today for [name] to download application, will bring to next appt for [name] to set up).  9.  S/p right transmetatarsal amputation - Followed by Dr Sharol Given.   - Follow up in 2 weeks to reassess volume status. Followed by HF Paramedicine. Will notify Katie of medication changes. - She is at high risk for readmit given multiple co-morbidities.   Maricela Bo [facility] FNP-BC  07/06/2020 9:18 PM

## 2020-07-05 NOTE — Telephone Encounter (Signed)
I called [name] to remind he of her appointment tomorrow and help her get scheduled for a ride with [employer]. He phone was busy so I called the number on file for her husband. Nobody answered but the voicemail was set up for "Susan [name]" so I left a message requesting a call back. I will try again later today.  Jacquiline Doe, EMT 07/05/20

## 2020-07-06 ENCOUNTER — Other Ambulatory Visit: Payer: Self-pay | Admitting: *Deleted

## 2020-07-06 ENCOUNTER — Telehealth: Payer: Self-pay | Admitting: Family Medicine

## 2020-07-06 ENCOUNTER — Ambulatory Visit (HOSPITAL_COMMUNITY)
Admission: RE | Admit: 2020-07-06 | Discharge: 2020-07-06 | Disposition: A | Discharge status: Home / Self Care | Payer: HMO | Source: Ambulatory Visit | Attending: Family Medicine | Admitting: Family Medicine

## 2020-07-06 ENCOUNTER — Encounter (HOSPITAL_COMMUNITY): Payer: Self-pay

## 2020-07-06 VITALS — BP 146/88 | HR 81 | Wt 255.0 lb

## 2020-07-06 DIAGNOSIS — I5032 Chronic diastolic (congestive) heart failure: Secondary | ICD-10-CM | POA: Diagnosis not present

## 2020-07-06 DIAGNOSIS — R0683 Snoring: Secondary | ICD-10-CM

## 2020-07-06 DIAGNOSIS — M25561 Pain in right knee: Secondary | ICD-10-CM | POA: Diagnosis not present

## 2020-07-06 DIAGNOSIS — Z8249 Family history of ischemic heart disease and other diseases of the circulatory system: Secondary | ICD-10-CM | POA: Diagnosis not present

## 2020-07-06 DIAGNOSIS — E1122 Type 2 diabetes mellitus with diabetic chronic kidney disease: Secondary | ICD-10-CM | POA: Diagnosis not present

## 2020-07-06 DIAGNOSIS — Z7902 Long term (current) use of antithrombotics/antiplatelets: Secondary | ICD-10-CM | POA: Diagnosis not present

## 2020-07-06 DIAGNOSIS — E1142 Type 2 diabetes mellitus with diabetic polyneuropathy: Secondary | ICD-10-CM | POA: Diagnosis not present

## 2020-07-06 DIAGNOSIS — I2510 Atherosclerotic heart disease of native coronary artery without angina pectoris: Secondary | ICD-10-CM | POA: Insufficient documentation

## 2020-07-06 DIAGNOSIS — Z79899 Other long term (current) drug therapy: Secondary | ICD-10-CM | POA: Diagnosis not present

## 2020-07-06 DIAGNOSIS — Z6841 Body Mass Index (BMI) 40.0 and over, adult: Secondary | ICD-10-CM | POA: Diagnosis not present

## 2020-07-06 DIAGNOSIS — Z7182 Exercise counseling: Secondary | ICD-10-CM | POA: Insufficient documentation

## 2020-07-06 DIAGNOSIS — E1165 Type 2 diabetes mellitus with hyperglycemia: Secondary | ICD-10-CM | POA: Insufficient documentation

## 2020-07-06 DIAGNOSIS — Z89431 Acquired absence of right foot: Secondary | ICD-10-CM

## 2020-07-06 DIAGNOSIS — M25562 Pain in left knee: Secondary | ICD-10-CM | POA: Insufficient documentation

## 2020-07-06 DIAGNOSIS — I130 Hypertensive heart and chronic kidney disease with heart failure and stage 1 through stage 4 chronic kidney disease, or unspecified chronic kidney disease: Secondary | ICD-10-CM | POA: Diagnosis not present

## 2020-07-06 DIAGNOSIS — Z885 Allergy status to narcotic agent status: Secondary | ICD-10-CM | POA: Insufficient documentation

## 2020-07-06 DIAGNOSIS — Z794 Long term (current) use of insulin: Secondary | ICD-10-CM | POA: Insufficient documentation

## 2020-07-06 DIAGNOSIS — R7989 Other specified abnormal findings of blood chemistry: Secondary | ICD-10-CM | POA: Diagnosis not present

## 2020-07-06 DIAGNOSIS — Z87891 Personal history of nicotine dependence: Secondary | ICD-10-CM | POA: Diagnosis not present

## 2020-07-06 DIAGNOSIS — N184 Chronic kidney disease, stage 4 (severe): Secondary | ICD-10-CM | POA: Insufficient documentation

## 2020-07-06 DIAGNOSIS — Z7982 Long term (current) use of aspirin: Secondary | ICD-10-CM | POA: Diagnosis not present

## 2020-07-06 DIAGNOSIS — Z955 Presence of coronary angioplasty implant and graft: Secondary | ICD-10-CM | POA: Insufficient documentation

## 2020-07-06 DIAGNOSIS — I428 Other cardiomyopathies: Secondary | ICD-10-CM | POA: Diagnosis not present

## 2020-07-06 DIAGNOSIS — Z9641 Presence of insulin pump (external) (internal): Secondary | ICD-10-CM | POA: Diagnosis not present

## 2020-07-06 DIAGNOSIS — I10 Essential (primary) hypertension: Secondary | ICD-10-CM

## 2020-07-06 DIAGNOSIS — N189 Chronic kidney disease, unspecified: Secondary | ICD-10-CM

## 2020-07-06 DIAGNOSIS — R21 Rash and other nonspecific skin eruption: Secondary | ICD-10-CM

## 2020-07-06 DIAGNOSIS — R5383 Other fatigue: Secondary | ICD-10-CM | POA: Diagnosis not present

## 2020-07-06 DIAGNOSIS — N179 Acute kidney failure, unspecified: Secondary | ICD-10-CM

## 2020-07-06 DIAGNOSIS — L299 Pruritus, unspecified: Secondary | ICD-10-CM

## 2020-07-06 MED ORDER — TORSEMIDE 20 MG PO TABS: 80.0000 mg | ORAL_TABLET | Freq: Two times a day (BID) | ORAL | 3 refills | Status: DC

## 2020-07-06 NOTE — Progress Notes (Signed)
ReDS Vest / Clip - 07/06/20 1400      ReDS Vest / Clip   Station Marker D    Ruler Value 46    ReDS Value Range Low volume    ReDS Actual Value 30    Anatomical Comments sitting

## 2020-07-06 NOTE — Telephone Encounter (Signed)
Pt was seen at [facility], they contacted office for pt to get a referral to dermatology. Pt stated that she spoke with provider at last visit.  Cb#: [phone]

## 2020-07-06 NOTE — Telephone Encounter (Signed)
I dont know what she is referring to??

## 2020-07-06 NOTE — Addendum Note (Signed)
Encounter addended by: Rafael Bihari, FNP on: 07/06/2020 9:23 PM  Actions taken: Clinical Note Signed

## 2020-07-06 NOTE — Patient Instructions (Addendum)
DECREASE Torsemide 80mg  (4 tablets) once a day Friday, Saturday, and Sunday  On Monday, INCREASE Torsemide 80mg  (4 tablets) twice daily  Your physician has recommended that you have a sleep study. This test records several body functions during sleep, including: brain activity, eye movement, oxygen and carbon dioxide blood levels, heart rate and rhythm, breathing rate and rhythm, the flow of air through your mouth and nose, snoring, body muscle movements, and chest and belly movement.  Your physician recommends that you schedule repeat labs in 7-10 days  If you have any questions or concerns before your next appointment please send [name] a message through [place] or call our office at [phone].    TO LEAVE A MESSAGE FOR THE NURSE SELECT OPTION 2, PLEASE LEAVE A MESSAGE INCLUDING: . YOUR NAME . DATE OF BIRTH . CALL BACK NUMBER . REASON FOR CALL**this is important as we prioritize the call backs  YOU WILL RECEIVE A CALL BACK THE SAME DAY AS LONG AS YOU CALL BEFORE 4:00 PM

## 2020-07-06 NOTE — Progress Notes (Signed)
CSW met with pt during clinic visit and had sign rider waiver for [employer].  Also provided pt with numbers for [employer] and [employer] case workers who have been trying to reach patient.  Patient reports she will call them to discuss services and hopefully be able to get some added support at home.  Will continue to follow and assist as needed  Jorge Ny, [place] [facility] Desk#: [phone] Cell#: [phone]

## 2020-07-06 NOTE — Telephone Encounter (Signed)
No notes about dermatology issue/ referral seen in last OV.   Please advise.

## 2020-07-06 NOTE — Telephone Encounter (Signed)
Call placed to patient. LMTRC.  

## 2020-07-09 NOTE — Telephone Encounter (Signed)
Received call from patient.   Reports that she is having increased itching all over her body. States that there is no rash noted, but she does have open scratches to buttocks.   Reports that she has had increased itching since December 2021. Reports that she has not had any new foods, detergents, soaps, etc.   Advised to use Cortisone cream, benadryl, pepcid, zyrtec as needed. Advised to use thick zinc based cream for buttocks.   Referral to dermatology placed.

## 2020-07-10 ENCOUNTER — Telehealth (HOSPITAL_COMMUNITY): Payer: Self-pay

## 2020-07-10 ENCOUNTER — Other Ambulatory Visit: Payer: Self-pay | Admitting: Family Medicine

## 2020-07-10 NOTE — Telephone Encounter (Signed)
Attempted to reach pt regarding home visit this week.  Her phone is busy again after numerous tries. Will try again tomor.   Marylouise Stacks, EMT-Paramedic  07/10/20

## 2020-07-12 ENCOUNTER — Ambulatory Visit: Payer: Self-pay | Admitting: Orthopedic Surgery

## 2020-07-13 ENCOUNTER — Telehealth (HOSPITAL_COMMUNITY): Payer: Self-pay | Admitting: Licensed Clinical Social Worker

## 2020-07-13 NOTE — Telephone Encounter (Signed)
CSW received call from pt who was wanting CSW to provide [employer] number.  CSW provided with phone number and pt will call Monday when paramedic is back.  CSW also inquired if she had reached out to contacts for [employer] that [place] had provided at last visit- has misplaced that sheet so CSW provided with phone numbers again and encouraged her to call to see what services they could assist with.  Will continue to follow and assist as needed  Jorge Ny, [place] [facility] Desk#: [phone] Cell#: [phone]

## 2020-07-18 DIAGNOSIS — I5032 Chronic diastolic (congestive) heart failure: Secondary | ICD-10-CM | POA: Diagnosis not present

## 2020-07-19 ENCOUNTER — Ambulatory Visit: Payer: HMO | Admitting: Orthopedic Surgery

## 2020-07-19 ENCOUNTER — Encounter (HOSPITAL_COMMUNITY): Payer: HMO

## 2020-07-19 DIAGNOSIS — Z89431 Acquired absence of right foot: Secondary | ICD-10-CM

## 2020-07-19 DIAGNOSIS — L97919 Non-pressure chronic ulcer of unspecified part of right lower leg with unspecified severity: Secondary | ICD-10-CM | POA: Diagnosis not present

## 2020-07-19 DIAGNOSIS — I87333 Chronic venous hypertension (idiopathic) with ulcer and inflammation of bilateral lower extremity: Secondary | ICD-10-CM

## 2020-07-19 DIAGNOSIS — L97929 Non-pressure chronic ulcer of unspecified part of left lower leg with unspecified severity: Secondary | ICD-10-CM | POA: Diagnosis not present

## 2020-07-20 ENCOUNTER — Encounter: Payer: Self-pay | Admitting: Orthopedic Surgery

## 2020-07-20 NOTE — Progress Notes (Signed)
Office Visit Note   Patient: Susan [name]           Date of Birth: [birth year]           MRN: [id number] Visit Date: 07/19/2020              Requested by: Alycia Rossetti, MD [address] [place],  [place] [address] PCP: Alycia Rossetti, MD  Chief Complaint  Patient presents with  . Left Leg - Follow-up  . Right Leg - Follow-up      HPI: Patient is a 70 year old woman who presents for 2 separate issues #1 right transmetatarsal amputation #2 left leg venous insufficiency with swelling.  Patient states she is also had some itching in her arms she denies any changes in her medicine or detergent.  Assessment & Plan: Visit Diagnoses:  1. Idiopathic chronic venous hypertension of both lower extremities with ulcer and inflammation ([place])   2. History of transmetatarsal amputation of right foot ([place])     Plan: Recommend patient use a extra-large compression socks she is currently wearing a double extra-large compression sock.  The nails were trimmed x5.  Patient has a follow-up appointment with her primary care physician and she will inquire if she can change the Plavix to another medication for her cardiac stents.  Follow-Up Instructions: Return in about 3 weeks (around 08/09/2020).   Ortho Exam  Patient is alert, oriented, no adenopathy, well-dressed, normal affect, normal respiratory effort. Examination patient's calf measures 43 cm in circumference bilaterally.  She has brawny skin color changes in both legs with some new small venous ulcers on the left leg.  She has thickened discolored onychomycotic nails x5 on the left she cannot trim them on her own and the nails were trimmed x5 without complication.  Patient has excoriation where she has ecchymosis and bruising from her Plavix.  Imaging: No results found. No images are attached to the encounter.  Labs: Lab Results  Component Value Date   HGBA1C 8.7 (H) 05/17/2020   HGBA1C 11.0 (H) 03/09/2020   HGBA1C 11.8 (H)  12/21/2019   CRP 1.5 (H) 03/13/2020   CRP 1.9 (H) 03/12/2020   CRP 3.8 (H) 03/11/2020   LABURIC 9.1 (H) 10/12/2018   LABURIC 9.0 (H) 03/06/2018   REPTSTATUS 06/07/2020 FINAL 06/05/2020   CULT (A) 06/05/2020    <10,000 COLONIES/mL INSIGNIFICANT GROWTH Performed at [facility], [place] [address] [place], [place] [address]    LABORGA ESCHERICHIA COLI (A) 05/17/2020     Lab Results  Component Value Date   ALBUMIN 2.3 (L) 05/28/2020   ALBUMIN 2.7 (L) 05/27/2020   ALBUMIN 2.6 (L) 05/26/2020   LABURIC 9.1 (H) 10/12/2018   LABURIC 9.0 (H) 03/06/2018    Lab Results  Component Value Date   MG 2.0 05/26/2020   MG 1.8 05/25/2020   MG 2.1 05/18/2020   No results found for: VD25OH  No results found for: PREALBUMIN CBC EXTENDED Latest Ref Rng & Units 07/05/2020 05/26/2020 05/25/2020  WBC 3.8 - 10.8 Thousand/uL 7.9 6.0 6.5  RBC 3.80 - 5.10 Million/uL 4.23 4.18 4.10  HGB 11.7 - 15.5 g/dL 11.0(L) 10.8(L) 10.6(L)  HCT 35.0 - 45.0 % 35.5 35.8(L) 35.4(L)  PLT 140 - 400 Thousand/uL 224 321 334  NEUTROABS 1,500 - 7,800 cells/uL 6,067 4.0 -  LYMPHSABS 850 - 3,900 cells/uL 656(L) 0.9 -     There is no height or weight on file to calculate BMI.  Orders:  No orders  of the defined types were placed in this encounter.  No orders of the defined types were placed in this encounter.    Procedures: No procedures performed  Clinical Data: No additional findings.  ROS:  All other systems negative, except as noted in the HPI. Review of Systems  Objective: Vital Signs: There were no vitals taken for this visit.  Specialty Comments:  No specialty comments available.  PMFS History: Patient Active Problem List   Diagnosis Date Noted  . Chronic kidney disease (CKD) 05/25/2020  . Hypoalbuminemia 05/25/2020  . GERD (gastroesophageal reflux disease) 05/25/2020  . Pressure injury of skin 05/17/2020  . Obesity, Class III, BMI 40-49.9 (morbid obesity) ([place]) 03/07/2020  . Common bile  duct (CBD) obstruction 05/28/2019  . Benign neoplasm of ascending colon   . Benign neoplasm of transverse colon   . Benign neoplasm of descending colon   . Benign neoplasm of sigmoid colon   . Gastric polyps   . Prolonged QT interval 03/11/2019  . Onychomycosis 06/21/2018  . Osteomyelitis of second toe of right foot ([place])   . 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 ([place])   . PVD (peripheral vascular disease) ([place]) 10/26/2017  . Hypothyroidism 07/27/2017  . PAH (pulmonary artery hypertension) ([place])   . Impaired ambulation 07/19/2017  . Leg cramps 02/27/2017  . Peripheral edema 01/12/2017  . Diabetic neuropathy ([place]) 11/12/2016  . 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 mellitus ([place]) 10/24/2015  . Anemia 10/03/2015  . Generalized anxiety disorder 10/03/2015  . Insomnia 10/03/2015  . Hyperglycemia due to diabetes mellitus ([place]) 06/07/2015  . Chronic diastolic CHF (congestive heart failure) ([place]) 06/07/2015  . Non compliance with medical treatment 04/17/2014  . Rotator cuff tear 03/14/2014  . Class 3 obesity 09/23/2013  . Hypotension 12/25/2012  . Urinary incontinence   . MDD (major depressive disorder) 11/12/2010  . RBBB (right bundle branch block)   . CAD (coronary artery disease)   . Hyperlipemia 01/22/2009  . Essential hypertension 01/22/2009   Past Medical History:  Diagnosis Date  . Acute MI ([place]) 1999; 2007  . Anemia    hx  . Anginal pain ([place])   . Anxiety   . ARF (acute renal failure) ([place]) 06/2017   [facility]  . Arthritis    "generalized" (03/15/2014)  . CAD (coronary artery disease)    MI in 2000 - MI  2007 - treated bare metal stent (no nuclear since then as 9/11)  . Carotid artery disease ([place])   . CHF (congestive heart failure) ([place])   . Chronic diastolic heart failure (HCC)    a) ECHO (08/2013) EF 55-60% and RV function nl b) RHC (08/2013) RA 4, RV 30/5/7, PA 25/10 (16), PCWP 7, Fick CO/CI 6.3/2.7, PVR 1.5 WU, PA 61 and 66%  . Daily  headache    "~ every other day; since I fell in June" (03/15/2014)  . Depression   . Dyslipidemia   . Dyspnea   . Exertional shortness of breath   . HTN (hypertension)   . Hypothyroidism   . Neuropathy   . Obesity   . Osteoarthritis   . Peripheral neuropathy   . PONV (postoperative nausea and vomiting)   . RBBB (right bundle branch block)    Old  . Stroke [facility])    mini strokes  . Syncope    likely due to low blood sugar  . Tachycardia    Sinus tachycardia  . Type II diabetes mellitus ([place])  Type II  . Urinary incontinence   . Venous insufficiency     Family History  Problem Relation Age of Onset  . Heart attack Mother 58    Past Surgical History:  Procedure Laterality Date  . ABDOMINAL HYSTERECTOMY  1980's  . AMPUTATION Right 02/24/2018   Procedure: RIGHT FOOT GREAT TOE AND 2ND TOE AMPUTATION;  Surgeon: Newt Minion, MD;  Location: [facility];  Service: Orthopedics;  Laterality: Right;  . AMPUTATION Right 04/30/2018   Procedure: RIGHT TRANSMETATARSAL AMPUTATION;  Surgeon: Newt Minion, MD;  Location: [facility];  Service: Orthopedics;  Laterality: Right;  . BIOPSY  05/27/2020   Procedure: BIOPSY;  Surgeon: [name], DO;  [facility];  Service: Endoscopy;;  . CATARACT EXTRACTION, BILATERAL Bilateral ?2013  . COLONOSCOPY W/ POLYPECTOMY    . COLONOSCOPY WITH PROPOFOL N/A 03/13/2019   Procedure: COLONOSCOPY WITH PROPOFOL;  Surgeon: Jerene Bears, MD;  Location: [facility];  Service: Gastroenterology;  Laterality: N/A;  . [place]; 2007   "1 + 1"  . ESOPHAGOGASTRODUODENOSCOPY (EGD) WITH PROPOFOL N/A 03/13/2019   Procedure: ESOPHAGOGASTRODUODENOSCOPY (EGD) WITH PROPOFOL;  Surgeon: Jerene Bears, MD;  Location[facility] ENDOSCOPY;  Service: Gastroenterology;  Laterality: N/A;  . ESOPHAGOGASTRODUODENOSCOPY (EGD) WITH PROPOFOL N/A 05/27/2020   Procedure: ESOPHAGOGASTRODUODENOSCOPY (EGD) WITH PROPOFOL;  Surgeon: [name],  DO;  [facility];  Service: Endoscopy;  Laterality: N/A;  . EYE SURGERY Bilateral    [place]  . HEMOSTASIS CLIP PLACEMENT  03/13/2019   Procedure: HEMOSTASIS CLIP PLACEMENT;  Surgeon: Jerene Bears, MD;  Location[facility];  Service: Gastroenterology;;  . KNEE ARTHROSCOPY Left 10/25/2006  . POLYPECTOMY  03/13/2019   Procedure: POLYPECTOMY;  Surgeon: Jerene Bears, MD;  Location: [facility];  Service: Gastroenterology;;  . RIGHT HEART CATH N/A 07/24/2017   Procedure: RIGHT HEART CATH;  Surgeon: Jolaine Artist, MD;  Location: [facility] LAB;  Service: Cardiovascular;  Laterality: N/A;  . RIGHT HEART CATHETERIZATION N/A 09/22/2013   Procedure: RIGHT HEART CATH;  Surgeon: Jolaine Artist, MD;  Location: [facility] CATH LAB;  Service: Cardiovascular;  Laterality: N/A;  . SHOULDER ARTHROSCOPY WITH OPEN ROTATOR CUFF REPAIR Right 03/14/2014   Procedure: RIGHT SHOULDER ARTHROSCOPY WITH BICEPS RELEASE, OPEN SUBSCAPULA REPAIR, OPEN SUPRASPINATUS REPAIR.;  Surgeon: Meredith Pel, MD;  Location: [facility];  Service: Orthopedics;  Laterality: Right;  . TOE AMPUTATION Right 02/24/2018   GREAT TOE AND 2ND TOE AMPUTATION  . TUBAL LIGATION  1970's   Social History   Occupational History    Employer: UNEMPLOYED  Tobacco Use  . Smoking status: Former Smoker    Packs/day: 3.00    Years: 32.00    Pack years: 96.00    Types: Cigarettes    Quit date: 10/24/1997    Years since quitting: 22.7  . Smokeless tobacco: Never Used  Vaping Use  . Vaping Use: Never used  Substance and Sexual Activity  . Alcohol use: Not Currently    Comment: "might have 2-3 daiquiris in the summer"  . Drug use: No  . Sexual activity: Never

## 2020-07-22 ENCOUNTER — Other Ambulatory Visit: Payer: Self-pay | Admitting: Family Medicine

## 2020-07-23 ENCOUNTER — Other Ambulatory Visit: Payer: Self-pay | Admitting: Family Medicine

## 2020-07-23 ENCOUNTER — Other Ambulatory Visit: Payer: Self-pay

## 2020-07-23 ENCOUNTER — Encounter: Payer: Self-pay | Admitting: Family Medicine

## 2020-07-23 ENCOUNTER — Ambulatory Visit (INDEPENDENT_AMBULATORY_CARE_PROVIDER_SITE_OTHER): Payer: HMO | Admitting: Family Medicine

## 2020-07-23 VITALS — BP 144/78 | HR 76 | Temp 97.9°F | Resp 14 | Ht 66.0 in | Wt 251.0 lb

## 2020-07-23 DIAGNOSIS — I129 Hypertensive chronic kidney disease with stage 1 through stage 4 chronic kidney disease, or unspecified chronic kidney disease: Secondary | ICD-10-CM | POA: Diagnosis not present

## 2020-07-23 DIAGNOSIS — I509 Heart failure, unspecified: Secondary | ICD-10-CM | POA: Diagnosis not present

## 2020-07-23 DIAGNOSIS — G4733 Obstructive sleep apnea (adult) (pediatric): Secondary | ICD-10-CM | POA: Diagnosis not present

## 2020-07-23 DIAGNOSIS — L89302 Pressure ulcer of unspecified buttock, stage 2: Secondary | ICD-10-CM

## 2020-07-23 DIAGNOSIS — B372 Candidiasis of skin and nail: Secondary | ICD-10-CM | POA: Diagnosis not present

## 2020-07-23 DIAGNOSIS — Z89431 Acquired absence of right foot: Secondary | ICD-10-CM | POA: Diagnosis not present

## 2020-07-23 DIAGNOSIS — E1122 Type 2 diabetes mellitus with diabetic chronic kidney disease: Secondary | ICD-10-CM | POA: Diagnosis not present

## 2020-07-23 DIAGNOSIS — N184 Chronic kidney disease, stage 4 (severe): Secondary | ICD-10-CM | POA: Diagnosis not present

## 2020-07-23 DIAGNOSIS — F331 Major depressive disorder, recurrent, moderate: Secondary | ICD-10-CM

## 2020-07-23 MED ORDER — NYSTATIN 100000 UNIT/GM EX CREA: 1.0000 "application " | TOPICAL_CREAM | Freq: Two times a day (BID) | CUTANEOUS | 1 refills | Status: DC

## 2020-07-23 MED ORDER — FLUOXETINE HCL 20 MG PO TABS
20.0000 mg | ORAL_TABLET | Freq: Every day | ORAL | 3 refills | Status: DC
Start: 2020-07-23 — End: 2020-07-24

## 2020-07-23 NOTE — Assessment & Plan Note (Signed)
Patient with no major depression disorder as well as generalized anxiety disorder.  She has not noted any improvement at all with the Wellbutrin will to try something different.  Unfortunately all the medications could possibly have prolongation of the QT.  I wait between fluoxetine and Zoloft which she states she has been on both in the past but does not recall how they affected her.  She is willing to try different medication because she does not want to go to a psychiatrist.  We will start her on fluoxetine 20 mg once a day.  I cannot go on the Wellbutrin because of her renal function.  On a scale the medications fluoxetine is on the lower end for a prolonged QT.  She will follow-up in 4 weeks with new PCP here in the office.   She has 2 stage II pressure sores on the buttocks.  These originally occurred around the time she was in the hospital a lot.  We have placed DuoDERM to the lesions to help protect.  She has significant improvement in comfort with sitting after they were placed.  We will have her use nystatin for the intertrigo maceration in the gluteal cleft and the skin surrounding.  Try to get home health at the house to help with wounds.  She is at high risk for these warts in the setting of her uncontrolled diabetes mellitus chronic kidney disease and other comorbidities.

## 2020-07-23 NOTE — Progress Notes (Signed)
Subjective:    Patient ID: Susan [name], female    DOB: [birth year], 70 y.o.   MRN: [id number]  Patient presents for Rash (Buttocks- states that area is raw and is burning- has been using desitin and butt creams without relief)   Pt here with rash on buttocks for the past 2 months , scratching a lot.  She has noticed some blood in her depends from the area.  Fortunately she cannot see the area.  Her husband has been helping her put on blood paste but it has not improved.  She was also started on Wellbutrin a few weeks ago as she cannot continue her Lexapro due to prolonged QT.  She was also not getting a lot of benefit with regards to her depression.  She states that she does not feel like the Wellbutrin is doing anything like to change to something else.  She has not had any side effects with the medication.  She continues to decline seeing a psychologist and a psychiatrist.  She has been on multiple medications in the past for her depression with minimal improvement.   Review Of Systems:  GEN- denies fatigue, fever, weight loss,weakness, recent illness HEENT- denies eye drainage, change in vision, nasal discharge, CVS- denies chest pain, palpitations RESP- denies SOB, cough, wheeze ABD- denies N/V, change in stools, abd pain GU- denies dysuria, hematuria, dribbling, incontinence MSK- + joint pain, muscle aches, injury Neuro- denies headache, dizziness, syncope, seizure activity       Objective:    BP (!) 144/78   Pulse 76   Temp 97.9 F (36.6 C) (Temporal)   Resp 14   Ht 5\' 6"  (1.676 m)   Wt 251 lb (113.9 kg)   SpO2 96%   BMI 40.51 kg/m  GEN- NAD, alert and oriented x3 CVS- RRR, no murmur RESP-CTAB Skin- bilat buttocks with erythema, maceration in cleft, no odor, right buttocks 4x2.5cm ulceration, 2 small dime size ulceration left side, mild bleeding from right lesion, NT  Psych- normal affect and mood  EXT-chronic venous statis changes, non pitting edema Pulses- Radial   2+      Assessment & Plan:      Problem List Items Addressed This Visit      Unprioritized   MDD (major depressive disorder)    Patient with no major depression disorder as well as generalized anxiety disorder.  She has not noted any improvement at all with the Wellbutrin will to try something different.  Unfortunately all the medications could possibly have prolongation of the QT.  I wait between fluoxetine and Zoloft which she states she has been on both in the past but does not recall how they affected her.  She is willing to try different medication because she does not want to go to a psychiatrist.  We will start her on fluoxetine 20 mg once a day.  I cannot go on the Wellbutrin because of her renal function.  On a scale the medications fluoxetine is on the lower end for a prolonged QT.  She will follow-up in 4 weeks with new PCP here in the office.   She has 2 stage II pressure sores on the buttocks.  These originally occurred around the time she was in the hospital a lot.  We have placed DuoDERM to the lesions to help protect.  She has significant improvement in comfort with sitting after they were placed.  We will have her use nystatin for the intertrigo maceration in the gluteal cleft and  the skin surrounding.  Try to get home health at the house to help with wounds.  She is at high risk for these warts in the setting of her uncontrolled diabetes mellitus chronic kidney disease and other comorbidities.      Relevant Medications   FLUoxetine (PROZAC) 20 MG tablet   Pressure injury of skin    Other Visit Diagnoses    Candidiasis, intertrigo    -  Primary   Relevant Medications   nystatin cream (MYCOSTATIN)      Note: This dictation was prepared with Dragon dictation along with smaller phrase technology. Any transcriptional errors that result from this process are unintentional.

## 2020-07-23 NOTE — Patient Instructions (Addendum)
Adapt for oxygen Referral Home Health nurse  Stop wellbutrin and start prozac 20mg  once a day  F/U Dr. Dennard Schaumann in 4 weeks 30 MINUTE SLOT

## 2020-07-24 ENCOUNTER — Other Ambulatory Visit (HOSPITAL_COMMUNITY): Payer: Self-pay

## 2020-07-24 ENCOUNTER — Encounter (HOSPITAL_COMMUNITY): Payer: Self-pay

## 2020-07-24 ENCOUNTER — Ambulatory Visit (HOSPITAL_COMMUNITY)
Admission: RE | Admit: 2020-07-24 | Discharge: 2020-07-24 | Disposition: A | Discharge status: Home / Self Care | Payer: HMO | Source: Ambulatory Visit | Attending: Family Medicine | Admitting: Family Medicine

## 2020-07-24 ENCOUNTER — Telehealth: Payer: Self-pay | Admitting: *Deleted

## 2020-07-24 VITALS — BP 141/78 | HR 77 | Wt 253.0 lb

## 2020-07-24 DIAGNOSIS — I130 Hypertensive heart and chronic kidney disease with heart failure and stage 1 through stage 4 chronic kidney disease, or unspecified chronic kidney disease: Secondary | ICD-10-CM | POA: Insufficient documentation

## 2020-07-24 DIAGNOSIS — Z7982 Long term (current) use of aspirin: Secondary | ICD-10-CM | POA: Insufficient documentation

## 2020-07-24 DIAGNOSIS — Z794 Long term (current) use of insulin: Secondary | ICD-10-CM | POA: Insufficient documentation

## 2020-07-24 DIAGNOSIS — Z79890 Hormone replacement therapy (postmenopausal): Secondary | ICD-10-CM | POA: Insufficient documentation

## 2020-07-24 DIAGNOSIS — I5032 Chronic diastolic (congestive) heart failure: Secondary | ICD-10-CM | POA: Insufficient documentation

## 2020-07-24 DIAGNOSIS — N184 Chronic kidney disease, stage 4 (severe): Secondary | ICD-10-CM | POA: Insufficient documentation

## 2020-07-24 DIAGNOSIS — Z955 Presence of coronary angioplasty implant and graft: Secondary | ICD-10-CM | POA: Insufficient documentation

## 2020-07-24 DIAGNOSIS — Z79899 Other long term (current) drug therapy: Secondary | ICD-10-CM | POA: Insufficient documentation

## 2020-07-24 DIAGNOSIS — Z8249 Family history of ischemic heart disease and other diseases of the circulatory system: Secondary | ICD-10-CM | POA: Diagnosis not present

## 2020-07-24 DIAGNOSIS — Z9641 Presence of insulin pump (external) (internal): Secondary | ICD-10-CM | POA: Insufficient documentation

## 2020-07-24 DIAGNOSIS — E1165 Type 2 diabetes mellitus with hyperglycemia: Secondary | ICD-10-CM | POA: Diagnosis not present

## 2020-07-24 DIAGNOSIS — Z7902 Long term (current) use of antithrombotics/antiplatelets: Secondary | ICD-10-CM | POA: Diagnosis not present

## 2020-07-24 DIAGNOSIS — Z6841 Body Mass Index (BMI) 40.0 and over, adult: Secondary | ICD-10-CM | POA: Diagnosis not present

## 2020-07-24 DIAGNOSIS — E1122 Type 2 diabetes mellitus with diabetic chronic kidney disease: Secondary | ICD-10-CM | POA: Diagnosis not present

## 2020-07-24 DIAGNOSIS — Z885 Allergy status to narcotic agent status: Secondary | ICD-10-CM | POA: Diagnosis not present

## 2020-07-24 DIAGNOSIS — I428 Other cardiomyopathies: Secondary | ICD-10-CM | POA: Diagnosis not present

## 2020-07-24 DIAGNOSIS — I2510 Atherosclerotic heart disease of native coronary artery without angina pectoris: Secondary | ICD-10-CM | POA: Diagnosis not present

## 2020-07-24 DIAGNOSIS — Z87891 Personal history of nicotine dependence: Secondary | ICD-10-CM | POA: Diagnosis not present

## 2020-07-24 DIAGNOSIS — I252 Old myocardial infarction: Secondary | ICD-10-CM | POA: Diagnosis not present

## 2020-07-24 LAB — BASIC METABOLIC PANEL
Anion gap: 10 (ref 5–15)
BUN: 58 mg/dL — ABNORMAL HIGH (ref 8–23)
CO2: 25 mmol/L (ref 22–32)
Calcium: 9.1 mg/dL (ref 8.9–10.3)
Chloride: 103 mmol/L (ref 98–111)
Creatinine, Ser: 2.27 mg/dL — ABNORMAL HIGH (ref 0.44–1.00)
GFR, Estimated: 23 mL/min — ABNORMAL LOW (ref >60)
Glucose, Bld: 303 mg/dL — ABNORMAL HIGH (ref 70–99)
Potassium: 4.5 mmol/L (ref 3.5–5.1)
Sodium: 138 mmol/L (ref 135–145)

## 2020-07-24 LAB — BRAIN NATRIURETIC PEPTIDE: B Natriuretic Peptide: 471.7 pg/mL — ABNORMAL HIGH (ref 0.0–100.0)

## 2020-07-24 NOTE — Progress Notes (Signed)
Paramedicine Encounter   Patient ID: Susan [name] , female,   DOB: 11/08/1950,70 y.o.,  MRN: [id number]   Met patient in clinic today with provider.  Weight @ clinic-253  B/p-140/78 p-77 [id number]  Her b/p goal range needs to stay around 140/145.   She looks like she feels well today, she reports her breathing is ok.  She did get her talking scales but have not got them out of the box yet.  Her wound doctor is ordering home health for her wound on her bottom.  She reports she is taking potassium 1 a day. Its not on her list. Her labs will be checked and go from there.   She has noticed a decrease of her weight at home and decrease in clinic.  She reports decreased urine output too. So depends on her labs as to what she will do with torsemide increase.   Marylouise Stacks, [place] 07/24/2020

## 2020-07-24 NOTE — Progress Notes (Signed)
Advanced Heart Failure Clinic Note   Primary Physician [name], [name], MD HF Cardiologist: Glori Bickers, MD   Reason for Visit:  Chronic Diastolic Heart Failure  HPI:  Susan [name] is a 70 y.o. female with a history of RBBB, DM, Hypothyroidish, diastolic heart failure, CAD MI 2000/2007 BMS 2007, TIA, obesity, CKD Stage IV and  Rt transmetatarsal amputation.   Admitted June 1st through June 3rd, 2017 with altered mental status, hypothermia, and weakness. Diuretics initially held and later restarted.   Admitted 2/27-07/28/17 for AKI. Torsemide was held and restarted at 60 mg BID. She had RHC 07/24/17 that showed mild PAH with evidence of RV strain likely due to OHS/OSA. She was discharged to SNF.   Admitted 10/4401 with A/C diastolic HF. Diuresed with IV lasix.   Admitted 10/24-10/27/19 with syncope. She was orthostatic. Had AKI with creatinine up to 3.33 and required IVF. Renal US showed no hydronephrosis. Losartan and Imdur were DC'd. Creatinine 1.76 on day of DC.   Was admitted 1/21 for confusion and abdominal pain. Found to have Klebsiella pneumoniae UTI treated w/ abx. Also with elevated LFTs. CT of abdomen showed cholelithiasis. MRCP showed cholelithiasis without evidence of cholecystitis or choledocholithiasis. Abdominal US with doppler showed no acute finding.No concern for cholangitis. All viral serologies negative. Her Crestor was discontinued. She had outpatient GI f/u and continued work-up including a liver biopsy 08/11/19. No definite pathologic fibrosis noted in pathology report.   Admitted to [facility] 05/17/20 with N/V/D in the setting of possible gastroparesis.    Admitted to [facility] 05/24/20 with and A/C diastolic heart failure. Diuresed with IV lasix and transitioned to torsemide 80 mg twice a day.   She returned for HF follow up 1/22. Stressed out about taking care of her disabled son. SOB with exertion. + Orthopnea. Eating fast food. Not weighing at home. Taking all  medications. Lives with her husband and disabled son. She was instructed to increase torsemide to 100 mg bid x 2 days, then back down to 80 mg bid.  Had return f/u again on 2/11 and was still taking 100 of torsemide bid and had not reduced back down to 80 bid as previously directed. Labs showed AKI, w/ SCr rising from baseline of 1.9>>2.4. Was advised to reduce torsemide to 80 mg once daily x 3 days, then return to 80 mg bid.   Back again today for f/u. Here w/ paramedicine. Wt is down 9 lb since last visit. Feels better but still SOB w/ certain activities, NYHA Class III. No [name]. Reports stable 2 pillow orthopnea. No CP. BP mildly elevated 474 systolic but has not yet taken am dose of torsemide yet today. Reports she had f/u w/ Dr. Hollie Salk yesterday and was given good report. UA was completed but no BMP. No med changes were made.        Cardiac Studies: Echo (9/21): EF 55-60%, RV ok Echo (10/19): EF 55-60%, Grade I DD  RHC (07/24/17): showed mild PAH with evidence of RV strain likely due to OHS/OSA.  Findings: RA = 17 RV = 48/15 PA = 49/11 (30) PCW = 18 Fick cardiac output/index = 7.0/3.0 PVR = 0.5 WU Ao sat = 97% PA sat = 62%, 64% Assessment: 1. Normal left-sided pressures 2. Mild PAH with evidence of RV strain likely due to OHS/OSA 3. Normal cardiac output  Lexiscan w/ stress echo (2018): EF 68%, low risk  LHC (2007): BMS distal RCA Cardiolite (03/2002): EF of 51% but no ischemia LHC (  2000): BMS RCA   ROS: All systems reviewed and negative except as per HPI.   Current Meds  Medication Sig  . albuterol (VENTOLIN HFA) 108 (90 Base) MCG/ACT inhaler TAKE 2 PUFFS BY MOUTH EVERY 6 HOURS AS NEEDED FOR WHEEZE OR SHORTNESS OF BREATH  . allopurinol (ZYLOPRIM) 100 MG tablet TAKE 1 TABLET BY MOUTH TWICE A DAY  . Ascorbic Acid (VITAMIN C) 1000 MG tablet Take 1,000 mg by mouth daily.  Marland Kitchen aspirin EC 81 MG tablet Take 1 tablet (81 mg total) by mouth daily with breakfast.  . Bismuth  Tribromoph-Petrolatum (XEROFORM PETROLAT GAUZE 5"X9") MISC Apply 1 each topically 2 (two) times daily. For dressing changes  . buPROPion (WELLBUTRIN XL) 150 MG 24 hr tablet Take 1 tablet (150 mg total) by mouth daily. For depression  . carvedilol (COREG) 12.5 MG tablet Take 1 tablet (12.5 mg total) by mouth 2 (two) times daily with a meal.  . cholecalciferol (VITAMIN D) 1000 units tablet Take 1,000 Units by mouth daily with supper.   . clopidogrel (PLAVIX) 75 MG tablet Take 1 tablet (75 mg total) by mouth daily with breakfast.  . ferrous sulfate 325 (65 FE) MG tablet Take 1 tablet (325 mg total) by mouth 2 (two) times daily with a meal. Reported on 08/21/2015  . guaiFENesin-dextromethorphan (ROBITUSSIN DM) 100-10 MG/5ML syrup Take 10 mLs by mouth every 4 (four) hours as needed for cough.  . Insulin Disposable Pump (OMNIPOD DASH 5 PACK PODS) MISC Inject into the skin See admin instructions. Use continuously with Novolin R - change every 72 hours.  . insulin NPH Human (HUMULIN N,NOVOLIN N) 100 UNIT/ML injection Inject 10-15 Units into the skin See admin instructions. Inject 15 units subcutaneously in the morning if CBG >300; inject 10 units 200-300  . insulin regular (NOVOLIN R) 100 units/mL injection Inject into the skin See admin instructions. Manually add bolus to continuous dose via OmniPod 3 times daily per sliding scale (CBG 80-150 7 units, 151-200 9 units, 201-250 12 units, 251-300 14 units, 301- 400 17 units)  . levothyroxine (SYNTHROID, LEVOTHROID) 50 MCG tablet Take 1 tablet (50 mcg total) by mouth daily before breakfast.  . magnesium oxide (MAG-OX) 400 MG tablet TAKE 1 TABLET BY MOUTH EVERY DAY  . nitroGLYCERIN (NITROSTAT) 0.4 MG SL tablet Place 1 tablet (0.4 mg total) under the tongue every 5 (five) minutes as needed for chest pain.  Marland Kitchen ondansetron (ZOFRAN) 4 MG tablet Take 1 tablet (4 mg total) by mouth every 8 (eight) hours as needed for nausea or vomiting.  . pregabalin (LYRICA) 150 MG  capsule Take 1 capsule (150 mg total) by mouth 2 (two) times daily.  Marland Kitchen torsemide (DEMADEX) 20 MG tablet Take 4 tablets (80 mg total) by mouth 2 (two) times daily.  . traZODone (DESYREL) 100 MG tablet TAKE 1 AND 1/2 TABLETS BY MOUTH AT BEDTIME AS NEEDED FOR SLEEP.  Marland Kitchen ursodiol (ACTIGALL) 500 MG tablet Take 500 mg by mouth 3 (three) times daily.  . [DISCONTINUED] FLUoxetine (PROZAC) 20 MG tablet Take 1 tablet (20 mg total) by mouth daily.  . [DISCONTINUED] Nystatin (GERHARDT'S BUTT CREAM) CREA Apply 1 application topically 3 (three) times daily. Apply Nystatin (Gerhardt's BUTT Cream) to buttock wound  . [DISCONTINUED] nystatin (NYSTATIN) powder Apply 1 application topically 3 (three) times daily as needed.  . [DISCONTINUED] vitamin E 1000 UNIT capsule Take 1,000 Units by mouth daily with supper.    Allergies  Allergen Reactions  . Codeine Nausea And Vomiting  Past Medical History:  Diagnosis Date  . Acute MI ([place]) 1999; 2007  . Anemia    hx  . Anginal pain ([place])   . Anxiety   . ARF (acute renal failure) ([place]) 06/2017   [facility]  . Arthritis    "generalized" (03/15/2014)  . CAD (coronary artery disease)    MI in 2000 - MI  2007 - treated bare metal stent (no nuclear since then as 9/11)  . Carotid artery disease ([place])   . CHF (congestive heart failure) ([place])   . Chronic diastolic heart failure (HCC)    a) ECHO (08/2013) EF 55-60% and RV function nl b) RHC (08/2013) RA 4, RV 30/5/7, PA 25/10 (16), PCWP 7, Fick CO/CI 6.3/2.7, PVR 1.5 WU, PA 61 and 66%  . Daily headache    "~ every other day; since I fell in June" (03/15/2014)  . Depression   . Dyslipidemia   . Dyspnea   . Exertional shortness of breath   . HTN (hypertension)   . Hypothyroidism   . Neuropathy   . Obesity   . Osteoarthritis   . Peripheral neuropathy   . PONV (postoperative nausea and vomiting)   . RBBB (right bundle branch block)    Old  . Stroke [facility])    mini strokes  . Syncope    likely due to low  blood sugar  . Tachycardia    Sinus tachycardia  . Type II diabetes mellitus (HCC)    Type II  . Urinary incontinence   . Venous insufficiency    Family History  Problem Relation Age of Onset  . Heart attack Mother 33   Past Surgical History:  Procedure Laterality Date  . ABDOMINAL HYSTERECTOMY  1980's  . AMPUTATION Right 02/24/2018   Procedure: RIGHT FOOT GREAT TOE AND 2ND TOE AMPUTATION;  Surgeon: Newt Minion, MD;  Location: [place];  Service: Orthopedics;  Laterality: Right;  . AMPUTATION Right 04/30/2018   Procedure: RIGHT TRANSMETATARSAL AMPUTATION;  Surgeon: Newt Minion, MD;  Location: [place];  Service: Orthopedics;  Laterality: Right;  . BIOPSY  05/27/2020   Procedure: BIOPSY;  Surgeon: [name], DO;  [facility];  Service: Endoscopy;;  . CATARACT EXTRACTION, BILATERAL Bilateral ?2013  . COLONOSCOPY W/ POLYPECTOMY    . COLONOSCOPY WITH PROPOFOL N/A 03/13/2019   Procedure: COLONOSCOPY WITH PROPOFOL;  Surgeon: Jerene Bears, MD;  Location: [place];  Service: Gastroenterology;  Laterality: N/A;  . [place]; 2007   "1 + 1"  . ESOPHAGOGASTRODUODENOSCOPY (EGD) WITH PROPOFOL N/A 03/13/2019   Procedure: ESOPHAGOGASTRODUODENOSCOPY (EGD) WITH PROPOFOL;  Surgeon: Jerene Bears, MD;  Location: [facility] ENDOSCOPY;  Service: Gastroenterology;  Laterality: N/A;  . ESOPHAGOGASTRODUODENOSCOPY (EGD) WITH PROPOFOL N/A 05/27/2020   Procedure: ESOPHAGOGASTRODUODENOSCOPY (EGD) WITH PROPOFOL;  Surgeon: [name], DO;  [facility];  Service: Endoscopy;  Laterality: N/A;  . EYE SURGERY Bilateral    [place]  . HEMOSTASIS CLIP PLACEMENT  03/13/2019   Procedure: HEMOSTASIS CLIP PLACEMENT;  Surgeon: Jerene Bears, MD;  Location: [facility] ENDOSCOPY;  Service: Gastroenterology;;  . KNEE ARTHROSCOPY Left 10/25/2006  . POLYPECTOMY  03/13/2019   Procedure: POLYPECTOMY;  Surgeon: Jerene Bears, MD;  Location: [place];  Service:  Gastroenterology;;  . RIGHT HEART CATH N/A 07/24/2017   Procedure: RIGHT HEART CATH;  Surgeon: Jolaine Artist, MD;  Location: [place] CV LAB;  Service: Cardiovascular;  Laterality: N/A;  . RIGHT HEART  CATHETERIZATION N/A 09/22/2013   Procedure: RIGHT HEART CATH;  Surgeon: Jolaine Artist, MD;  Location: [facility] CATH LAB;  Service: Cardiovascular;  Laterality: N/A;  . SHOULDER ARTHROSCOPY WITH OPEN ROTATOR CUFF REPAIR Right 03/14/2014   Procedure: RIGHT SHOULDER ARTHROSCOPY WITH BICEPS RELEASE, OPEN SUBSCAPULA REPAIR, OPEN SUPRASPINATUS REPAIR.;  Surgeon: Meredith Pel, MD;  Location: [place];  Service: Orthopedics;  Laterality: Right;  . TOE AMPUTATION Right 02/24/2018   GREAT TOE AND 2ND TOE AMPUTATION  . TUBAL LIGATION  1970's   Social History   Socioeconomic History  . Marital status: Married    Spouse name: Not on file  . Number of children: 3  . Years of education: 12th  . Highest education level: Not on file  Occupational History    Employer: UNEMPLOYED  Tobacco Use  . Smoking status: Former Smoker    Packs/day: 3.00    Years: 32.00    Pack years: 96.00    Types: Cigarettes    Quit date: 10/24/1997    Years since quitting: 22.7  . Smokeless tobacco: Never Used  Vaping Use  . Vaping Use: Never used  Substance and Sexual Activity  . Alcohol use: Not Currently    Comment: "might have 2-3 daiquiris in the summer"  . Drug use: No  . Sexual activity: Never  Other Topics Concern  . Not on file  Social History Narrative   Pt lives at home with her spouse.   Caffeine Use- 3 sodas daily   Social Determinants of Health   Financial Resource Strain: Low Risk   . Difficulty of Paying Living Expenses: Not very hard  Food Insecurity: No Food Insecurity  . Worried About Charity fundraiser in the Last Year: Never true  . Ran Out of Food in the Last Year: Never true  Transportation Needs: No Transportation Needs  . Lack of Transportation (Medical): No  . Lack of  Transportation (Non-Medical): No  Physical Activity: Inactive  . Days of Exercise per Week: 0 days  . Minutes of Exercise per Session: 10 min  Stress: Not on file  Social Connections: Not on file  Intimate Partner Violence: Not on file   Lipid Panel     Component Value Date/Time   CHOL 242 (H) 12/21/2019 1550   TRIG 125 03/07/2020 1808   HDL 52 12/21/2019 1550   CHOLHDL 4.7 12/21/2019 1550   VLDL 60 (H) 12/21/2019 1550   LDLCALC 130 (H) 12/21/2019 1550   LDLCALC 65 01/17/2019 1608    Blood pressure (!) 141/78, pulse 77, weight 114.8 kg (253 lb), SpO2 99 %.  Wt Readings from Last 3 Encounters:  07/24/20 114.8 kg (253 lb)  07/23/20 113.9 kg (251 lb)  07/06/20 115.7 kg (255 lb)    Today's Vitals   07/24/20 1336  BP: (!) 141/78  Pulse: 77  SpO2: 99%  Weight: 114.8 kg (253 lb)   Body mass index is 40.84 kg/m.  PHYSICAL EXAM: General:  Well appearing, moderately obese. No respiratory difficulty HEENT: normal Neck: supple. JVD 7-8 cm. Carotids 2+ bilat; no bruits. No lymphadenopathy or thyromegaly appreciated. Cor: PMI nondisplaced. Regular rate & rhythm. No rubs, gallops or murmurs. Lungs: clear Abdomen: soft, nontender, nondistended. No hepatosplenomegaly. No bruits or masses. Good bowel sounds. Extremities: no cyanosis, clubbing, rash, edema Neuro: alert & oriented x 3, cranial nerves grossly intact. moves all 4 extremities w/o difficulty. Affect pleasant.   ASSESSMENT AND PLAN:   1. Chronic Diastolic Heart Failure:  - NICM, Echo  06/2017: EF 55-60%, grade 1 DD - Echo 03/19/18: EF 55-60%, grade 1 DD - Echo 01/2020 EF 55-60%. RV normal  - NYHA III, confounded by obesity/ deconditioning and bilateral knee pain.  - Volume assessment limited by body habitus but does not appear grossly fluid overloaded on exam. ReDs Clip device currently not working - Check BMP and BNP today  - If BNP elevated, will plan to increase torsemide to 100 mg qam/80 qpm. If WNL and or c/w  baseline, will continue on 80 mg bid.  - Continue carvedilol 12.5 mg bid.     2.  Uncontrolled DM:  - Improving, recent Hgb A1c was down to 8.7 (previously 11)  - Follows closely with Dr. Bubba Camp. Has an insulin pump.  3. CAD: history of CAD with BMS in 2007.  Low risk nuc study in 2018  - stable w/o CP  - Continue DAPT w/ ASA + Plavix.  - Continue  blocker therapy. - Off statin due to previously elevated LFTs.  - CMET 2/22 showed normalization of LFTs.  4. Morbid Obesity: - Body mass index is 40.84 kg/m.  - wt loss advised   6. HTN:  - controlled on current regimen   7. CKD Stage IV - Followed by Dr. Hollie Salk.  - Baseline creatinine ~1.5-1.9 - Last BMP 2/10 w/ bump to 2.4 and diuretics adjusted - Repeat BMP today   8. Daytime fatigue - Suspicion for sleep apnea.  - She is agreeable to home sleep study (has her phone today for [name] to download application)  9.  S/p right transmetatarsal amputation - Followed by Dr Sharol Given.    F/u w/ PCP in 6-8 weeks    Vallie Teters PA-C 07/24/2020 2:16 PM

## 2020-07-24 NOTE — Patient Instructions (Signed)
Labs today We will only contact you if something comes back abnormal or we need to make some changes. Otherwise no news is good news!  Your physician recommends that you schedule a follow-up appointment in: 6-8 weeks  in the Advanced Practitioners (PA/NP) Clinic   Do the following things EVERYDAY: 1) Weigh yourself in the morning before breakfast. Write it down and keep it in a log. 2) Take your medicines as prescribed 3) Eat low salt foods--Limit salt (sodium) to 2000 mg per day.  4) Stay as active as you can everyday 5) Limit all fluids for the day to less than 2 liters  At the [facility], you and your health needs are our priority. As part of our continuing mission to provide you with exceptional heart care, we have created designated Provider Care Teams. These Care Teams include your primary Cardiologist (physician) and Advanced Practice Providers (APPs- Physician Assistants and Nurse Practitioners) who all work together to provide you with the care you need, when you need it.   You may see any of the following providers on your designated Care Team at your next follow up: Marland Kitchen Dr Glori Bickers . Dr Loralie Champagne . Dr Vickki Muff . Darrick Grinder, NP . Lyda Jester, [place] . Audry Riles, PharmD   Please be sure to bring in all your medications bottles to every appointment.   If you have any questions or concerns before your next appointment please send [name] a message through [place] or call our office at [phone].    TO LEAVE A MESSAGE FOR THE NURSE SELECT OPTION 2, PLEASE LEAVE A MESSAGE INCLUDING: . YOUR NAME . DATE OF BIRTH . CALL BACK NUMBER . REASON FOR CALL**this is important as we prioritize the call backs  YOU WILL RECEIVE A CALL BACK THE SAME DAY AS LONG AS YOU CALL BEFORE 4:00 PM  Please see our updated No Show and Same Day Appointment Cancellation Policy attached to your AVS.

## 2020-07-24 NOTE — Telephone Encounter (Signed)
Patient in office on 07/23/2020.  Reports that [place] states that Oxygen therapy in home is not covered by insurance.   Call placed to [employer]~ Mariann Laster, intake team member that has been assigned to case. [place].

## 2020-07-25 ENCOUNTER — Telehealth (HOSPITAL_COMMUNITY): Payer: Self-pay | Admitting: Surgery

## 2020-07-25 ENCOUNTER — Encounter (INDEPENDENT_AMBULATORY_CARE_PROVIDER_SITE_OTHER): Payer: HMO | Admitting: Cardiology

## 2020-07-25 DIAGNOSIS — I5032 Chronic diastolic (congestive) heart failure: Secondary | ICD-10-CM

## 2020-07-25 NOTE — Telephone Encounter (Signed)
I spoke with Susan [name] to instruct her that insurance prior authorization is not needed and that she can proceed with ordered home sleep study.  I asked that she complete within 48 hours.  She understands and agrees.

## 2020-07-25 NOTE — Telephone Encounter (Signed)
Received return call from [place], [place], Ext [phone]~ telephone.   Was advised that prior authorization was not completed from their office with insurance company. Therefore, insurance company is denying claim.   Reports that [employer] is in the process of correcting claim.

## 2020-07-26 NOTE — Telephone Encounter (Signed)
Received follow up call from [employer].   Was advised that authorization department is working on claim.

## 2020-07-31 ENCOUNTER — Encounter (HOSPITAL_COMMUNITY): Payer: Self-pay

## 2020-07-31 ENCOUNTER — Other Ambulatory Visit: Payer: Self-pay

## 2020-07-31 ENCOUNTER — Ambulatory Visit: Payer: HMO

## 2020-07-31 ENCOUNTER — Ambulatory Visit (HOSPITAL_COMMUNITY)
Admission: RE | Admit: 2020-07-31 | Discharge: 2020-07-31 | Disposition: A | Discharge status: Home / Self Care | Payer: HMO | Source: Ambulatory Visit | Attending: Internal Medicine | Admitting: Internal Medicine

## 2020-07-31 ENCOUNTER — Other Ambulatory Visit (HOSPITAL_COMMUNITY): Payer: Self-pay

## 2020-07-31 VITALS — BP 138/82 | HR 100 | Wt 260.4 lb

## 2020-07-31 DIAGNOSIS — E1165 Type 2 diabetes mellitus with hyperglycemia: Secondary | ICD-10-CM | POA: Diagnosis not present

## 2020-07-31 DIAGNOSIS — Z794 Long term (current) use of insulin: Secondary | ICD-10-CM | POA: Diagnosis not present

## 2020-07-31 DIAGNOSIS — E1122 Type 2 diabetes mellitus with diabetic chronic kidney disease: Secondary | ICD-10-CM | POA: Diagnosis not present

## 2020-07-31 DIAGNOSIS — I252 Old myocardial infarction: Secondary | ICD-10-CM | POA: Insufficient documentation

## 2020-07-31 DIAGNOSIS — Z8249 Family history of ischemic heart disease and other diseases of the circulatory system: Secondary | ICD-10-CM | POA: Diagnosis not present

## 2020-07-31 DIAGNOSIS — I2510 Atherosclerotic heart disease of native coronary artery without angina pectoris: Secondary | ICD-10-CM | POA: Diagnosis not present

## 2020-07-31 DIAGNOSIS — N184 Chronic kidney disease, stage 4 (severe): Secondary | ICD-10-CM | POA: Diagnosis not present

## 2020-07-31 DIAGNOSIS — I130 Hypertensive heart and chronic kidney disease with heart failure and stage 1 through stage 4 chronic kidney disease, or unspecified chronic kidney disease: Secondary | ICD-10-CM | POA: Diagnosis not present

## 2020-07-31 DIAGNOSIS — I5032 Chronic diastolic (congestive) heart failure: Secondary | ICD-10-CM

## 2020-07-31 DIAGNOSIS — Z6841 Body Mass Index (BMI) 40.0 and over, adult: Secondary | ICD-10-CM | POA: Insufficient documentation

## 2020-07-31 DIAGNOSIS — Z79899 Other long term (current) drug therapy: Secondary | ICD-10-CM | POA: Insufficient documentation

## 2020-07-31 DIAGNOSIS — E785 Hyperlipidemia, unspecified: Secondary | ICD-10-CM | POA: Insufficient documentation

## 2020-07-31 DIAGNOSIS — R0683 Snoring: Secondary | ICD-10-CM

## 2020-07-31 DIAGNOSIS — Z7982 Long term (current) use of aspirin: Secondary | ICD-10-CM | POA: Diagnosis not present

## 2020-07-31 DIAGNOSIS — Z87891 Personal history of nicotine dependence: Secondary | ICD-10-CM | POA: Insufficient documentation

## 2020-07-31 DIAGNOSIS — Z9641 Presence of insulin pump (external) (internal): Secondary | ICD-10-CM | POA: Insufficient documentation

## 2020-07-31 DIAGNOSIS — Z7902 Long term (current) use of antithrombotics/antiplatelets: Secondary | ICD-10-CM | POA: Insufficient documentation

## 2020-07-31 NOTE — Progress Notes (Signed)
Advanced Heart Failure Clinic Note   Primary Physician [name], [name], MD HF Cardiologist: Glori Bickers, MD  Nephrology: Dr Hollie Salk  Reason for Visit:  Chronic Diastolic Heart Failure  HPI:  Susan [name] is a 70 y.o. female with a history of RBBB, DM, Hypothyroidish, diastolic heart failure, CAD MI 2000/2007 BMS 2007, TIA, obesity, CKD Stage IV and  Rt transmetatarsal amputation.   Admitted June 1st through June 3rd, 2017 with altered mental status, hypothermia, and weakness. Diuretics initially held and later restarted.   Admitted 2/27-07/28/17 for AKI. Torsemide was held and restarted at 60 mg BID. She had RHC 07/24/17 that showed mild PAH with evidence of RV strain likely due to OHS/OSA. She was discharged to SNF.   Admitted 06/252 with A/C diastolic HF. Diuresed with IV lasix.   Admitted 10/24-10/27/19 with syncope. She was orthostatic. Had AKI with creatinine up to 3.33 and required IVF. Renal US showed no hydronephrosis. Losartan and Imdur were DC'd. Creatinine 1.76 on day of DC.   Was admitted 1/21 for confusion and abdominal pain. Found to have Klebsiella pneumoniae UTI treated w/ abx. Also with elevated LFTs. CT of abdomen showed cholelithiasis. MRCP showed cholelithiasis without evidence of cholecystitis or choledocholithiasis. Abdominal US with doppler showed no acute finding.No concern for cholangitis. All viral serologies negative. Her Crestor was discontinued. She had outpatient GI f/u and continued work-up including a liver biopsy 08/11/19. No definite pathologic fibrosis noted in pathology report.   Admitted to [facility] 05/17/20 with N/V/D in the setting of possible gastroparesis.    Admitted to [facility] 05/24/20 with and A/C diastolic heart failure. Diuresed with IV lasix and transitioned to torsemide 80 mg twice a day.   She returned for HF follow up 1/22. Stressed out about taking care of her disabled son. SOB with exertion. + Orthopnea. Eating fast food. Not  weighing at home. Taking all medications. Lives with her husband and disabled son. She was instructed to increase torsemide to 100 mg bid x 2 days, then back down to 80 mg bid.  Had return f/u again on 2/11 and was still taking 100 of torsemide bid and had not reduced back down to 80 bid as previously directed. Labs showed AKI, w/ SCr rising from baseline of 1.9>>2.4. Was advised to reduce torsemide to 80 mg once daily x 3 days, then return to 80 mg bid.   Had home sleep study 07/25/20 and this was negative for OSA.   Today she returns for HF follow up.Overall feeling fine. Remains SOB with exertion. Denies PND/Orthopnea. Appetite ok. Continues to eat fast food. No fever or chills. Not weighing at home. Taking all medications. Followed by HF Paramedicine.      Cardiac Studies: Echo (9/21): EF 55-60%, RV ok Echo (10/19): EF 55-60%, Grade I DD  RHC (07/24/17): showed mild PAH with evidence of RV strain likely due to OHS/OSA.  Findings: RA = 17 RV = 48/15 PA = 49/11 (30) PCW = 18 Fick cardiac output/index = 7.0/3.0 PVR = 0.5 WU Ao sat = 97% PA sat = 62%, 64% Assessment: 1. Normal left-sided pressures 2. Mild PAH with evidence of RV strain likely due to OHS/OSA 3. Normal cardiac output  Lexiscan w/ stress echo (2018): EF 68%, low risk  LHC (2007): BMS distal RCA Cardiolite (03/2002): EF of 51% but no ischemia LHC (2000): BMS RCA   ROS: All systems reviewed and negative except as per HPI.   Current Meds  Medication Sig  .  albuterol (VENTOLIN HFA) 108 (90 Base) MCG/ACT inhaler TAKE 2 PUFFS BY MOUTH EVERY 6 HOURS AS NEEDED FOR WHEEZE OR SHORTNESS OF BREATH  . allopurinol (ZYLOPRIM) 100 MG tablet TAKE 1 TABLET BY MOUTH TWICE A DAY  . Ascorbic Acid (VITAMIN C) 1000 MG tablet Take 1,000 mg by mouth daily.  Marland Kitchen aspirin EC 81 MG tablet Take 1 tablet (81 mg total) by mouth daily with breakfast.  . Bismuth Tribromoph-Petrolatum (XEROFORM PETROLAT GAUZE 5"X9") MISC Apply 1 each topically 2 (two)  times daily. For dressing changes  . buPROPion (WELLBUTRIN XL) 150 MG 24 hr tablet Take 1 tablet (150 mg total) by mouth daily. For depression  . carvedilol (COREG) 12.5 MG tablet Take 1 tablet (12.5 mg total) by mouth 2 (two) times daily with a meal.  . cholecalciferol (VITAMIN D) 1000 units tablet Take 1,000 Units by mouth daily with supper.   . clopidogrel (PLAVIX) 75 MG tablet Take 1 tablet (75 mg total) by mouth daily with breakfast.  . ferrous sulfate 325 (65 FE) MG tablet Take 1 tablet (325 mg total) by mouth 2 (two) times daily with a meal. Reported on 08/21/2015  . guaiFENesin-dextromethorphan (ROBITUSSIN DM) 100-10 MG/5ML syrup Take 10 mLs by mouth every 4 (four) hours as needed for cough.  . Insulin Disposable Pump (OMNIPOD DASH 5 PACK PODS) MISC Inject into the skin See admin instructions. Use continuously with Novolin R - change every 72 hours.  . insulin NPH Human (HUMULIN N,NOVOLIN N) 100 UNIT/ML injection Inject 10-15 Units into the skin See admin instructions. Inject 15 units subcutaneously in the morning if CBG >300; inject 10 units 200-300  . insulin regular (NOVOLIN R) 100 units/mL injection Inject into the skin See admin instructions. Manually add bolus to continuous dose via OmniPod 3 times daily per sliding scale (CBG 80-150 7 units, 151-200 9 units, 201-250 12 units, 251-300 14 units, 301- 400 17 units)  . levothyroxine (SYNTHROID, LEVOTHROID) 50 MCG tablet Take 1 tablet (50 mcg total) by mouth daily before breakfast.  . magnesium oxide (MAG-OX) 400 MG tablet TAKE 1 TABLET BY MOUTH EVERY DAY  . nitroGLYCERIN (NITROSTAT) 0.4 MG SL tablet Place 1 tablet (0.4 mg total) under the tongue every 5 (five) minutes as needed for chest pain.  Marland Kitchen ondansetron (ZOFRAN) 4 MG tablet Take 1 tablet (4 mg total) by mouth every 8 (eight) hours as needed for nausea or vomiting.  . potassium chloride SA (KLOR-CON) 20 MEQ tablet Take 20 mEq by mouth daily.  . pregabalin (LYRICA) 150 MG capsule Take 1  capsule (150 mg total) by mouth 2 (two) times daily.  Marland Kitchen torsemide (DEMADEX) 100 MG tablet Take 100 mg by mouth daily. 100 mg in the AM and 80 mg in the PM  . traZODone (DESYREL) 100 MG tablet TAKE 1 AND 1/2 TABLETS BY MOUTH AT BEDTIME AS NEEDED FOR SLEEP.  Marland Kitchen ursodiol (ACTIGALL) 500 MG tablet Take 500 mg by mouth 3 (three) times daily.   Allergies  Allergen Reactions  . Codeine Nausea And Vomiting   Past Medical History:  Diagnosis Date  . Acute MI ([place]) 1999; 2007  . Anemia    hx  . Anginal pain ([place])   . Anxiety   . ARF (acute renal failure) ([place]) 06/2017   [facility]  . Arthritis    "generalized" (03/15/2014)  . CAD (coronary artery disease)    MI in 2000 - MI  2007 - treated bare metal stent (no nuclear since then as 9/11)  .  Carotid artery disease ([place])   . CHF (congestive heart failure) ([place])   . Chronic diastolic heart failure (HCC)    a) ECHO (08/2013) EF 55-60% and RV function nl b) RHC (08/2013) RA 4, RV 30/5/7, PA 25/10 (16), PCWP 7, Fick CO/CI 6.3/2.7, PVR 1.5 WU, PA 61 and 66%  . Daily headache    "~ every other day; since I fell in June" (03/15/2014)  . Depression   . Dyslipidemia   . Dyspnea   . Exertional shortness of breath   . HTN (hypertension)   . Hypothyroidism   . Neuropathy   . Obesity   . Osteoarthritis   . Peripheral neuropathy   . PONV (postoperative nausea and vomiting)   . RBBB (right bundle branch block)    Old  . Stroke [facility])    mini strokes  . Syncope    likely due to low blood sugar  . Tachycardia    Sinus tachycardia  . Type II diabetes mellitus (HCC)    Type II  . Urinary incontinence   . Venous insufficiency    Family History  Problem Relation Age of Onset  . Heart attack Mother 44   Past Surgical History:  Procedure Laterality Date  . ABDOMINAL HYSTERECTOMY  1980's  . AMPUTATION Right 02/24/2018   Procedure: RIGHT FOOT GREAT TOE AND 2ND TOE AMPUTATION;  Surgeon: Newt Minion, MD;  Location: [place];  Service:  Orthopedics;  Laterality: Right;  . AMPUTATION Right 04/30/2018   Procedure: RIGHT TRANSMETATARSAL AMPUTATION;  Surgeon: Newt Minion, MD;  Location: [place];  Service: Orthopedics;  Laterality: Right;  . BIOPSY  05/27/2020   Procedure: BIOPSY;  Surgeon: [name], DO;  [facility];  Service: Endoscopy;;  . CATARACT EXTRACTION, BILATERAL Bilateral ?2013  . COLONOSCOPY W/ POLYPECTOMY    . COLONOSCOPY WITH PROPOFOL N/A 03/13/2019   Procedure: COLONOSCOPY WITH PROPOFOL;  Surgeon: Jerene Bears, MD;  Location: [place];  Service: Gastroenterology;  Laterality: N/A;  . [place]; 2007   "1 + 1"  . ESOPHAGOGASTRODUODENOSCOPY (EGD) WITH PROPOFOL N/A 03/13/2019   Procedure: ESOPHAGOGASTRODUODENOSCOPY (EGD) WITH PROPOFOL;  Surgeon: Jerene Bears, MD;  Location: [facility] ENDOSCOPY;  Service: Gastroenterology;  Laterality: N/A;  . ESOPHAGOGASTRODUODENOSCOPY (EGD) WITH PROPOFOL N/A 05/27/2020   Procedure: ESOPHAGOGASTRODUODENOSCOPY (EGD) WITH PROPOFOL;  Surgeon: [name], DO;  [facility];  Service: Endoscopy;  Laterality: N/A;  . EYE SURGERY Bilateral    [place]  . HEMOSTASIS CLIP PLACEMENT  03/13/2019   Procedure: HEMOSTASIS CLIP PLACEMENT;  Surgeon: Jerene Bears, MD;  Location[facility] ENDOSCOPY;  Service: Gastroenterology;;  . KNEE ARTHROSCOPY Left 10/25/2006  . POLYPECTOMY  03/13/2019   Procedure: POLYPECTOMY;  Surgeon: Jerene Bears, MD;  Location: [place];  Service: Gastroenterology;;  . RIGHT HEART CATH N/A 07/24/2017   Procedure: RIGHT HEART CATH;  Surgeon: Jolaine Artist, MD;  Location: [place] CV LAB;  Service: Cardiovascular;  Laterality: N/A;  . RIGHT HEART CATHETERIZATION N/A 09/22/2013   Procedure: RIGHT HEART CATH;  Surgeon: Jolaine Artist, MD;  Location: [facility] CATH LAB;  Service: Cardiovascular;  Laterality: N/A;  . SHOULDER ARTHROSCOPY WITH OPEN ROTATOR CUFF REPAIR Right 03/14/2014   Procedure: RIGHT  SHOULDER ARTHROSCOPY WITH BICEPS RELEASE, OPEN SUBSCAPULA REPAIR, OPEN SUPRASPINATUS REPAIR.;  Surgeon: Meredith Pel, MD;  Location: [place];  Service: Orthopedics;  Laterality: Right;  . TOE AMPUTATION Right 02/24/2018   GREAT TOE AND 2ND  TOE AMPUTATION  . TUBAL LIGATION  1970's   Social History   Socioeconomic History  . Marital status: Married    Spouse name: Not on file  . Number of children: 3  . Years of education: 12th  . Highest education level: Not on file  Occupational History    Employer: UNEMPLOYED  Tobacco Use  . Smoking status: Former Smoker    Packs/day: 3.00    Years: 32.00    Pack years: 96.00    Types: Cigarettes    Quit date: 10/24/1997    Years since quitting: 22.7  . Smokeless tobacco: Never Used  Vaping Use  . Vaping Use: Never used  Substance and Sexual Activity  . Alcohol use: Not Currently    Comment: "might have 2-3 daiquiris in the summer"  . Drug use: No  . Sexual activity: Never  Other Topics Concern  . Not on file  Social History Narrative   Pt lives at home with her spouse.   Caffeine Use- 3 sodas daily   Social Determinants of Health   Financial Resource Strain: Low Risk   . Difficulty of Paying Living Expenses: Not very hard  Food Insecurity: No Food Insecurity  . Worried About Charity fundraiser in the Last Year: Never true  . Ran Out of Food in the Last Year: Never true  Transportation Needs: No Transportation Needs  . Lack of Transportation (Medical): No  . Lack of Transportation (Non-Medical): No  Physical Activity: Inactive  . Days of Exercise per Week: 0 days  . Minutes of Exercise per Session: 10 min  Stress: Not on file  Social Connections: Not on file  Intimate Partner Violence: Not on file   Lipid Panel     Component Value Date/Time   CHOL 242 (H) 12/21/2019 1550   TRIG 125 03/07/2020 1808   HDL 52 12/21/2019 1550   CHOLHDL 4.7 12/21/2019 1550   VLDL 60 (H) 12/21/2019 1550   LDLCALC 130 (H) 12/21/2019 1550    LDLCALC 65 01/17/2019 1608    Blood pressure 138/82, pulse 100, weight 118.1 kg (260 lb 6.4 oz), SpO2 94 %.  Wt Readings from Last 3 Encounters:  07/31/20 118.1 kg (260 lb 6.4 oz)  07/24/20 114.8 kg (253 lb)  07/23/20 113.9 kg (251 lb)    Today's Vitals   07/31/20 1414  BP: 138/82  Pulse: 100  SpO2: 94%  Weight: 118.1 kg (260 lb 6.4 oz)   Body mass index is 42.03 kg/m.  PHYSICAL EXAM: General:  No resp difficulty HEENT: normal Neck: supple. JVP 9-10 . Carotids 2+ bilat; no bruits. No lymphadenopathy or thryomegaly appreciated. Cor: PMI nondisplaced. Regular rate & rhythm. No rubs, gallops or murmurs. Lungs: clear Abdomen: obese, soft, nontender, nondistended. No hepatosplenomegaly. No bruits or masses. Good bowel sounds. Extremities: no cyanosis, clubbing, rash, R and LLE 1+ edema Neuro: alert & orientedx3, cranial nerves grossly intact. moves all 4 extremities w/o difficulty. Affect pleasant   ASSESSMENT AND PLAN:   1. Chronic Diastolic Heart Failure:  - NICM, Echo 06/2017: EF 55-60%, grade 1 DD - Echo 03/19/18: EF 55-60%, grade 1 DD - Echo 01/2020 EF 55-60%. RV normal  - NYHA III. Volume status mildly elevated. Given worsening renal function, I am going to keep diuretic regimen the same.  - Discussed low salt food choices.  - Continue carvedilol 12.5 mg bid.    2.  Uncontrolled DM:  - Improving, recent Hgb A1c was down to 8.7 (previously 11)  -  Follows closely with Dr. Bubba Camp. Has an insulin pump.  3. CAD: history of CAD with BMS in 2007.  Low risk nuc study in 2018  - No chest pain.  - Continue DAPT w/ ASA + Plavix.  - Continue  blocker therapy. - Off statin due to previously elevated LFTs.  .  4. Morbid Obesity: - Body mass index is 42.03 kg/m.  - Discussed portion control.   6. HTN:  - controlled on current regimen   7. CKD Stage IV - Followed by Dr. Hollie Salk.  - Baseline creatinine ~1.5-1.9 - Last BMP 2/10 w/ bump to 2.4 and diuretics adjusted - I  reviewed BMET from last week. Stable.   8. Daytime fatigue - Completed sleep study. Dr Radford Pax intepreted study. No evidence of OSA>   9.  S/p right transmetatarsal amputation - Followed by Dr Sharol Given.    Follow up in 3 months. Plan to check BMET at that time. Continue HF Paramedicine.    Colburn Asper NP-C  07/31/2020 2:47 PM

## 2020-07-31 NOTE — Progress Notes (Signed)
Paramedicine Encounter   Patient ID: Susan [name] , female,   DOB: [birth year],70 y.o.,  MRN: [id number]   Met patient in clinic today with provider.   Weight @ clinic-260 B/p-138/82 p-100 sp02-94  Pt here for f/u post med changes from last week.  Weight is up 7lbs from last week.  Pt reports she did eat at [employer] last week and ate [employer] on the way here.  She does snack a lot before bed to ensure her CBG doesn't drop over night.  Fluid meds will stay the same due to creatinine rising over past 4 months.  Will f/u with her next week.  We discussed a lot about what she eats, she does eat a lot of snacks, so we talked about that.   Marylouise Stacks, [place] 07/31/2020

## 2020-07-31 NOTE — Procedures (Signed)
     Sleep Study Report  Patient Information Name: Susan [name]  ID: [id number] Birth Date: 1951/02/12  Age: 70  Gender:F Study Date:07/25/2020 Referring Physician:Amy Ninfa Meeker, NP  TEST DESCRIPTION: Home sleep apnea testing was completed using the WatchPat, a Type 1 device, utilizing  peripheral arterial tonometry (PAT), chest movement, actigraphy, pulse oximetry, pulse rate, body position and snore.  AHI was calculated with apnea and hypopnea using valid sleep time as the denominator. RDI includes apneas,  hypopneas, and RERAs. The data acquired and the scoring of sleep and all associated events were performed in  accordance with the recommended standards and specifications as outlined in the AASM Manual for the Scoring of  Sleep and Associated Events 2.2.0 (2015).   FINDINGS: 1. No evidence of Obstructive Sleep Apnea with AHI 2.8hr.  2. No Central Sleep Apnea. 3. Oxygen desaturations as low as 86%. 4. Mild snoring was present. O2 sats were < 88% for 0.58minutes. 5. Total sleep time was 7 hrs and 24 min. 6. 32.8% of total sleep time was spent in REM sleep. 7. Normal sleep onset latency at 20 min.  8. Shortened REM sleep onset latency at 25 min.  9. Total awakenings were 3.   DIAGNOSIS:  Normal study with no significant sleep disordered breathing.  RECOMMENDATIONS: 1. Normal study with no significant sleep disordered breathing.  2. Healthy sleep recommendations include: adequate nightly sleep (normal 7-9 hrs/night), avoidance of caffeine after  noon and alcohol near bedtime, and maintaining a sleep environment that is cool, dark and quiet.  3. Weight loss for overweight patients is recommended.   4. Snoring recommendations include: weight loss where appropriate, side sleeping, and avoidance of alcohol before  Bed.  5. Operation of motor vehicle or dangerous equipment must be avoided when feeling drowsy, excessively sleepy, or  mentally fatigued.    6. An ENT consultation  which may be useful for specific causes of and possible treatment of bothersome snoring .   7. Weight loss may be of benefit in reducing the severity of snoring.   8.  Recommend in lab PSG given normal home sleep study.   Report prepared by: Signature: Fransico Him, MD [facility], [place] Board of Sleep Medicine  Electronically Signed: Jul 31, 2020

## 2020-07-31 NOTE — Patient Instructions (Signed)
Keep follow up as scheduled in 1 month  If you have any questions or concerns before your next appointment please send [name] a message through [place] or call our office at [phone].    TO LEAVE A MESSAGE FOR THE NURSE SELECT OPTION 2, PLEASE LEAVE A MESSAGE INCLUDING: . YOUR NAME . DATE OF BIRTH . CALL BACK NUMBER . REASON FOR CALL**this is important as we prioritize the call backs  [place] AS LONG AS YOU CALL BEFORE 4:00 PM  At the [facility], you and your health needs are our priority. As part of our continuing mission to provide you with exceptional heart care, we have created designated Provider Care Teams. These Care Teams include your primary Cardiologist (physician) and Advanced Practice Providers (APPs- Physician Assistants and Nurse Practitioners) who all work together to provide you with the care you need, when you need it.   You may see any of the following providers on your designated Care Team at your next follow up: Marland Kitchen Dr Glori Bickers . Dr Loralie Champagne . Dr Vickki Muff . Darrick Grinder, NP . Lyda Jester, [place] . Audry Riles, PharmD   Please be sure to bring in all your medications bottles to every appointment.   Do the following things EVERYDAY: 1) Weigh yourself in the morning before breakfast. Write it down and keep it in a log. 2) Take your medicines as prescribed 3) Eat low salt foods--Limit salt (sodium) to 2000 mg per day.  4) Stay as active as you can everyday 5) Limit all fluids for the day to less than 2 liters

## 2020-08-02 ENCOUNTER — Telehealth: Payer: Self-pay | Admitting: *Deleted

## 2020-08-02 DIAGNOSIS — I10 Essential (primary) hypertension: Secondary | ICD-10-CM | POA: Diagnosis not present

## 2020-08-02 DIAGNOSIS — E1165 Type 2 diabetes mellitus with hyperglycemia: Secondary | ICD-10-CM | POA: Diagnosis not present

## 2020-08-02 DIAGNOSIS — Z9641 Presence of insulin pump (external) (internal): Secondary | ICD-10-CM | POA: Diagnosis not present

## 2020-08-02 NOTE — Telephone Encounter (Signed)
Informed patient of sleep study results and patient understanding was verbalized. Patient understands her sleep study showed No evidence of OSA On home sleep study so recommendation is for in lab PSG please set up. Patient has declined to have the in lab PSG.

## 2020-08-02 NOTE — Telephone Encounter (Signed)
-----   Message from Sueanne Margarita, MD sent at 07/31/2020 12:23 PM EST ----- No evidence of OSA On home sleep study so recommendation is for in lab PSG please set up

## 2020-08-07 ENCOUNTER — Other Ambulatory Visit (HOSPITAL_COMMUNITY): Payer: Self-pay

## 2020-08-07 NOTE — Progress Notes (Signed)
Paramedicine Encounter    Patient ID: Susan [name], female    DOB: Sep 29, 1950, 70 y.o.   MRN: [id number]   Patient Care Team: [name], MD as PCP - General (Family Medicine) Bensimhon, Shaune Pascal, MD as PCP - Cardiology (Cardiology) Jacelyn Pi, MD (Endocrinology) Hollie Salk Nevin Bloodgood, PA-C (Neurology) Newt Minion, MD as Consulting Physician (Orthopedic Surgery)  Patient Active Problem List   Diagnosis Date Noted  . Chronic kidney disease (CKD) 05/25/2020  . Hypoalbuminemia 05/25/2020  . GERD (gastroesophageal reflux disease) 05/25/2020  . Pressure injury of skin 05/17/2020  . Obesity, Class III, BMI 40-49.9 (morbid obesity) ([place]) 03/07/2020  . Common bile duct (CBD) obstruction 05/28/2019  . Benign neoplasm of ascending colon   . Benign neoplasm of transverse colon   . Benign neoplasm of descending colon   . Benign neoplasm of sigmoid colon   . Gastric polyps   . Prolonged QT interval 03/11/2019  . Onychomycosis 06/21/2018  . Osteomyelitis of second toe of right foot ([place])   . 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 ([place])   . PVD (peripheral vascular disease) ([place]) 10/26/2017  . Hypothyroidism 07/27/2017  . PAH (pulmonary artery hypertension) ([place])   . Impaired ambulation 07/19/2017  . Leg cramps 02/27/2017  . Peripheral edema 01/12/2017  . Diabetic neuropathy ([place]) 11/12/2016  . 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 mellitus ([place]) 10/24/2015  . Anemia 10/03/2015  . Generalized anxiety disorder 10/03/2015  . Insomnia 10/03/2015  . Hyperglycemia due to diabetes mellitus ([place]) 06/07/2015  . Chronic diastolic CHF (congestive heart failure) ([place]) 06/07/2015  . Non compliance with medical treatment 04/17/2014  . Rotator cuff tear 03/14/2014  . Class 3 obesity 09/23/2013  . Hypotension 12/25/2012  . Urinary incontinence   . MDD (major depressive disorder) 11/12/2010  . RBBB (right bundle branch block)   . CAD (coronary artery disease)   .  Hyperlipemia 01/22/2009  . Essential hypertension 01/22/2009    Current Outpatient Medications:  .  albuterol (VENTOLIN HFA) 108 (90 Base) MCG/ACT inhaler, TAKE 2 PUFFS BY MOUTH EVERY 6 HOURS AS NEEDED FOR WHEEZE OR SHORTNESS OF BREATH, Disp: 8.5 g, Rfl: 1 .  allopurinol (ZYLOPRIM) 100 MG tablet, TAKE 1 TABLET BY MOUTH TWICE A DAY, Disp: 180 tablet, Rfl: 4 .  Ascorbic Acid (VITAMIN C) 1000 MG tablet, Take 1,000 mg by mouth daily., Disp: , Rfl:  .  aspirin EC 81 MG tablet, Take 1 tablet (81 mg total) by mouth daily with breakfast., Disp: 30 tablet, Rfl: 11 .  Bismuth Tribromoph-Petrolatum (XEROFORM PETROLAT GAUZE 5"X9") MISC, Apply 1 each topically 2 (two) times daily. For dressing changes, Disp: 50 each, Rfl: 1 .  carvedilol (COREG) 12.5 MG tablet, Take 1 tablet (12.5 mg total) by mouth 2 (two) times daily with a meal., Disp: 180 tablet, Rfl: 1 .  cholecalciferol (VITAMIN D) 1000 units tablet, Take 1,000 Units by mouth daily with supper. , Disp: , Rfl:  .  clopidogrel (PLAVIX) 75 MG tablet, Take 1 tablet (75 mg total) by mouth daily with breakfast., Disp: 90 tablet, Rfl: 0 .  ferrous sulfate 325 (65 FE) MG tablet, Take 1 tablet (325 mg total) by mouth 2 (two) times daily with a meal. Reported on 08/21/2015, Disp: 60 tablet, Rfl: 2 .  Insulin Disposable Pump (OMNIPOD DASH 5 PACK PODS) MISC, Inject into the skin See admin instructions. Use continuously with Novolin R - change every 72 hours., Disp: , Rfl:  .  insulin NPH  Human (HUMULIN N,NOVOLIN N) 100 UNIT/ML injection, Inject 10-15 Units into the skin See admin instructions. Inject 15 units subcutaneously in the morning if CBG >300; inject 10 units 200-300, Disp: , Rfl:  .  insulin regular (NOVOLIN R) 100 units/mL injection, Inject into the skin See admin instructions. Manually add bolus to continuous dose via OmniPod 3 times daily per sliding scale (CBG 80-150 7 units, 151-200 9 units, 201-250 12 units, 251-300 14 units, 301- 400 17 units), Disp: ,  Rfl:  .  levothyroxine (SYNTHROID, LEVOTHROID) 50 MCG tablet, Take 1 tablet (50 mcg total) by mouth daily before breakfast., Disp: 90 tablet, Rfl: 1 .  magnesium oxide (MAG-OX) 400 MG tablet, TAKE 1 TABLET BY MOUTH EVERY DAY, Disp: 120 tablet, Rfl: 2 .  potassium chloride SA (KLOR-CON) 20 MEQ tablet, Take 20 mEq by mouth daily., Disp: , Rfl:  .  pregabalin (LYRICA) 150 MG capsule, Take 1 capsule (150 mg total) by mouth 2 (two) times daily., Disp: 60 capsule, Rfl: 2 .  torsemide (DEMADEX) 100 MG tablet, Take 100 mg by mouth daily. 100 mg in the AM and 80 mg in the PM, Disp: , Rfl:  .  traZODone (DESYREL) 100 MG tablet, TAKE 1 AND 1/2 TABLETS BY MOUTH AT BEDTIME AS NEEDED FOR SLEEP., Disp: 135 tablet, Rfl: 1 .  ursodiol (ACTIGALL) 500 MG tablet, Take 500 mg by mouth 3 (three) times daily., Disp: , Rfl:  .  buPROPion (WELLBUTRIN XL) 150 MG 24 hr tablet, Take 1 tablet (150 mg total) by mouth daily. For depression (Patient not taking: Reported on 08/07/2020), Disp: 30 tablet, Rfl: 1 .  guaiFENesin-dextromethorphan (ROBITUSSIN DM) 100-10 MG/5ML syrup, Take 10 mLs by mouth every 4 (four) hours as needed for cough. (Patient not taking: Reported on 08/07/2020), Disp: 118 mL, Rfl: 0 .  nitroGLYCERIN (NITROSTAT) 0.4 MG SL tablet, Place 1 tablet (0.4 mg total) under the tongue every 5 (five) minutes as needed for chest pain. (Patient not taking: Reported on 08/07/2020), Disp: 25 tablet, Rfl: 1 .  ondansetron (ZOFRAN) 4 MG tablet, Take 1 tablet (4 mg total) by mouth every 8 (eight) hours as needed for nausea or vomiting. (Patient not taking: Reported on 08/07/2020), Disp: 30 tablet, Rfl: 1 Allergies  Allergen Reactions  . Codeine Nausea And Vomiting      Social History   Socioeconomic History  . Marital status: Married    Spouse name: Not on file  . Number of children: 3  . Years of education: 12th  . Highest education level: Not on file  Occupational History    Employer: UNEMPLOYED  Tobacco Use  .  Smoking status: Former Smoker    Packs/day: 3.00    Years: 32.00    Pack years: 96.00    Types: Cigarettes    Quit date: 10/24/1997    Years since quitting: 22.8  . Smokeless tobacco: Never Used  Vaping Use  . Vaping Use: Never used  Substance and Sexual Activity  . Alcohol use: Not Currently    Comment: "might have 2-3 daiquiris in the summer"  . Drug use: No  . Sexual activity: Never  Other Topics Concern  . Not on file  Social History Narrative   Pt lives at home with her spouse.   Caffeine Use- 3 sodas daily   Social Determinants of Health   Financial Resource Strain: Low Risk   . Difficulty of Paying Living Expenses: Not very hard  Food Insecurity: No Food Insecurity  . Worried About Running  Out of Food in the Last Year: Never true  . Ran Out of Food in the Last Year: Never true  Transportation Needs: No Transportation Needs  . Lack of Transportation (Medical): No  . Lack of Transportation (Non-Medical): No  Physical Activity: Inactive  . Days of Exercise per Week: 0 days  . Minutes of Exercise per Session: 10 min  Stress: Not on file  Social Connections: Not on file  Intimate Partner Violence: Not on file    Physical Exam      Future Appointments  Date Time Provider [place]  08/09/2020  2:30 PM Newt Minion, MD [facility] None  08/20/2020  3:00 PM [name], MD [facility] None  09/04/2020  1:30 PM [facility] PA/NP [facility] None    BP (!) 142/70   Pulse 82   Resp 18   Wt 264 lb (119.7 kg)   SpO2 98%   BMI 42.61 kg/m    CBG PTA-264 Weight yesterday-263 Last visit weight-260  Pt reports last night she didn't sleep good, her home sleep study was negative for OSA but they recommended an in lab study but right now she is declining b/c of her dogs and doesn't want to leave them in the care of her husband or son.  I mentioned leaving them with her daughter but she said she had other pets as well. She doesn't want to do it right now, maybe later.   She did say she has a red/tender spot on her breast, hurt during shower. I advised her to contact her PCP for that.  She is using her talking scales and they have been very helpful.  Yesterday she missed her morning dose of meds but then took her evening dose but she missed that due to her being busy in the morning, so I advised her should that happen again then she should take the 100mg  dose instead of the 80mg .  Her wounds on her bottom have improved but still there, she has not heard from home health yet for wound care but it looks like they are working on her claim.  Pt had episode last week where her insulin pump came off and the next day her CBG was over 700 but she went to her endocrinologist and they got her squared away and replaced it.  She had c/p the next day and then took 2 ntg and the discomfort subsided.   She is taking the ursodiol BID not TID due to cost and she is rationing it out due to it being so expensive.   Her legs have some swelling, she is wearing compression stockings.   Susan [name], [facility] Paramedic  08/07/20

## 2020-08-08 ENCOUNTER — Telehealth: Payer: Self-pay

## 2020-08-08 ENCOUNTER — Other Ambulatory Visit: Payer: Self-pay

## 2020-08-08 DIAGNOSIS — Z5189 Encounter for other specified aftercare: Secondary | ICD-10-CM

## 2020-08-08 NOTE — Telephone Encounter (Signed)
Appointment scheduled 08/20/2020.

## 2020-08-09 ENCOUNTER — Encounter: Payer: Self-pay | Admitting: Orthopedic Surgery

## 2020-08-09 ENCOUNTER — Telehealth (HOSPITAL_COMMUNITY): Payer: Self-pay

## 2020-08-09 ENCOUNTER — Other Ambulatory Visit: Payer: Self-pay

## 2020-08-09 ENCOUNTER — Ambulatory Visit: Payer: HMO | Admitting: Orthopedic Surgery

## 2020-08-09 DIAGNOSIS — L97919 Non-pressure chronic ulcer of unspecified part of right lower leg with unspecified severity: Secondary | ICD-10-CM | POA: Diagnosis not present

## 2020-08-09 DIAGNOSIS — M1A09X0 Idiopathic chronic gout, multiple sites, without tophus (tophi): Secondary | ICD-10-CM | POA: Diagnosis not present

## 2020-08-09 DIAGNOSIS — Z89431 Acquired absence of right foot: Secondary | ICD-10-CM

## 2020-08-09 DIAGNOSIS — I87333 Chronic venous hypertension (idiopathic) with ulcer and inflammation of bilateral lower extremity: Secondary | ICD-10-CM

## 2020-08-09 DIAGNOSIS — L97929 Non-pressure chronic ulcer of unspecified part of left lower leg with unspecified severity: Secondary | ICD-10-CM

## 2020-08-09 NOTE — Telephone Encounter (Signed)
Suanne Marker called me from [facility] provide [facility] home services as well. [facility] was ordered at end of February but it came to find out they cannot take this patient and she has stage 2 wound on her bottom. Suanne Marker has been in communication with her PCP office and have her another appointment scheduled but it is on 3/28. She got her PCP office to send in order to [facility] and they are trying to get someone to see her ASAP.  With the [facility] services they are trying to get someone to see her ASAP as well.    Marylouise Stacks, EMT-Paramedic  08/09/20

## 2020-08-09 NOTE — Addendum Note (Signed)
Addended by: Pamella Pert on: 08/09/2020 03:21 PM   Modules accepted: Orders

## 2020-08-09 NOTE — Progress Notes (Signed)
Office Visit Note   Patient: Susan [name]           Date of Birth: 25-Jul-1950           MRN: [id number] Visit Date: 08/09/2020              Requested by: [name], MD [address] [place],  [place] [address] PCP: [name], MD  Chief Complaint  Patient presents with  . Left Leg - Follow-up  . Right Leg - Follow-up      HPI: Patient is a 70 year old woman who presents complaining of increasing swelling in both legs.  She states she is on oxygen at night has shortness of breath with ambulation.  She states last week her sugars were running in the 700s she had her insulin pump adjusted and she states that now her sugars are running in the 300s to 400s.  Patient complains of itching all over.  Assessment & Plan: Visit Diagnoses:  1. 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 History of transmetatarsal amputation of right foot ([place])   3. Idiopathic chronic gout of multiple sites without tophus     Plan: We will draw a uric acid level today continue with compression socks continue with elevation.  If uric acid is elevated we will restart her colchicine.  Recommend patient follow-up with her primary care physician to have her insulin pump adjusted.  Follow-Up Instructions: Return in about 2 weeks (around 08/23/2020).   Ortho Exam  Patient is alert, oriented, no adenopathy, well-dressed, normal affect, normal respiratory effort. Examination patient has ecchymosis and bruising on her arms most likely from her Plavix she has increased swelling of both legs with very small open blisters her right calf is 46 cm in circumference left calf is 47 cm in circumference.  Patient's blood pressure is 150/82 oxygen saturation room air 95% temperature 98.4.  Imaging: No results found. No images are attached to the encounter.  Labs: Lab Results  Component Value Date   HGBA1C 8.7 (H) 05/17/2020   HGBA1C 11.0  (H) 03/09/2020   HGBA1C 11.8 (H) 12/21/2019   CRP 1.5 (H) 03/13/2020   CRP 1.9 (H) 03/12/2020   CRP 3.8 (H) 03/11/2020   LABURIC 9.1 (H) 10/12/2018   LABURIC 9.0 (H) 03/06/2018   REPTSTATUS 06/07/2020 FINAL 06/05/2020   CULT (A) 06/05/2020    <10,000 COLONIES/mL INSIGNIFICANT GROWTH Performed at [facility], [place] [address], [place], [place] [address]    LABORGA ESCHERICHIA COLI (A) 05/17/2020     Lab Results  Component Value Date   ALBUMIN 2.3 (L) 05/28/2020   ALBUMIN 2.7 (L) 05/27/2020   ALBUMIN 2.6 (L) 05/26/2020    Lab Results  Component Value Date   MG 2.0 05/26/2020   MG 1.8 05/25/2020   MG 2.1 05/18/2020   No results found for: VD25OH  No results found for: PREALBUMIN CBC EXTENDED Latest Ref Rng & Units 07/05/2020 05/26/2020 05/25/2020  WBC 3.8 - 10.8 Thousand/uL 7.9 6.0 6.5  RBC 3.80 - 5.10 Million/uL 4.23 4.18 4.10  HGB 11.7 - 15.5 g/dL 11.0(L) 10.8(L) 10.6(L)  HCT 35.0 - 45.0 % 35.5 35.8(L) 35.4(L)  PLT 140 - 400 Thousand/uL 224 321 334  NEUTROABS 1,500 - 7,800 cells/uL 6,067 4.0 -  LYMPHSABS 850 - 3,900 cells/uL 656(L) 0.9 -     There is no height or weight on file to calculate BMI.  Orders:  No  orders of the defined types were placed in this encounter.  No orders of the defined types were placed in this encounter.    Procedures: No procedures performed  Clinical Data: No additional findings.  ROS:  All other systems negative, except as noted in the HPI. Review of Systems  Objective: Vital Signs: There were no vitals taken for this visit.  Specialty Comments:  No specialty comments available.  PMFS History: Patient Active Problem List   Diagnosis Date Noted  . Chronic kidney disease (CKD) 05/25/2020  . Hypoalbuminemia 05/25/2020  . GERD (gastroesophageal reflux disease) 05/25/2020  . Pressure injury of skin 05/17/2020  . Obesity, Class III, BMI 40-49.9 (morbid obesity) ([place]) 03/07/2020  . Common bile duct (CBD) obstruction  05/28/2019  . Benign neoplasm of ascending colon   . Benign neoplasm of transverse colon   . Benign neoplasm of descending colon   . Benign neoplasm of sigmoid colon   . Gastric polyps   . Prolonged QT interval 03/11/2019  . Onychomycosis 06/21/2018  . Osteomyelitis of second toe of right foot ([place])   . 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 ([place])   . PVD (peripheral vascular disease) ([place]) 10/26/2017  . Hypothyroidism 07/27/2017  . PAH (pulmonary artery hypertension) ([place])   . Impaired ambulation 07/19/2017  . Leg cramps 02/27/2017  . Peripheral edema 01/12/2017  . Diabetic neuropathy ([place]) 11/12/2016  . 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 mellitus ([place]) 10/24/2015  . Anemia 10/03/2015  . Generalized anxiety disorder 10/03/2015  . Insomnia 10/03/2015  . Hyperglycemia due to diabetes mellitus ([place]) 06/07/2015  . Chronic diastolic CHF (congestive heart failure) ([place]) 06/07/2015  . Non compliance with medical treatment 04/17/2014  . Rotator cuff tear 03/14/2014  . Class 3 obesity 09/23/2013  . Hypotension 12/25/2012  . Urinary incontinence   . MDD (major depressive disorder) 11/12/2010  . RBBB (right bundle branch block)   . CAD (coronary artery disease)   . Hyperlipemia 01/22/2009  . Essential hypertension 01/22/2009   Past Medical History:  Diagnosis Date  . Acute MI ([place]) 1999; 2007  . Anemia    hx  . Anginal pain ([place])   . Anxiety   . ARF (acute renal failure) ([place]) 06/2017   [facility]  . Arthritis    "generalized" (03/15/2014)  . CAD (coronary artery disease)    MI in 2000 - MI  2007 - treated bare metal stent (no nuclear since then as 9/11)  . Carotid artery disease ([place])   . CHF (congestive heart failure) ([place])   . Chronic diastolic heart failure (H[facility])    a) ECHO (08/2013) EF 55-60% and RV function nl b) RHC (08/2013) RA 4, RV 30/5/7, PA 25/10 (16), PCWP 7, Fick CO/CI 6.3/2.7, PVR 1.5 WU, PA 61 and 66%  . Daily headache    "~ every  other day; since I fell in June" (03/15/2014)  . Depression   . Dyslipidemia   . Dyspnea   . Exertional shortness of breath   . HTN (hypertension)   . Hypothyroidism   . Neuropathy   . Obesity   . Osteoarthritis   . Peripheral neuropathy   . PONV (postoperative nausea and vomiting)   . RBBB (right bundle branch block)    Old  . Stroke [facility])    mini strokes  . Syncope    likely due to low blood sugar  . Tachycardia    Sinus tachycardia  . Type II diabetes mellitus ([place])  Type II  . Urinary incontinence   . Venous insufficiency     Family History  Problem Relation Age of Onset  . Heart attack Mother 33    Past Surgical History:  Procedure Laterality Date  . ABDOMINAL HYSTERECTOMY  1980's  . AMPUTATION Right 02/24/2018   Procedure: RIGHT FOOT GREAT TOE AND 2ND TOE AMPUTATION;  Surgeon: Newt Minion, MD;  Location: [facility];  Service: Orthopedics;  Laterality: Right;  . AMPUTATION Right 04/30/2018   Procedure: RIGHT TRANSMETATARSAL AMPUTATION;  Surgeon: Newt Minion, MD;  Location: [facility];  Service: Orthopedics;  Laterality: Right;  . BIOPSY  05/27/2020   Procedure: BIOPSY;  Surgeon: [name], DO;  [facility];  Service: Endoscopy;;  . CATARACT EXTRACTION, BILATERAL Bilateral ?2013  . COLONOSCOPY W/ POLYPECTOMY    . COLONOSCOPY WITH PROPOFOL N/A 03/13/2019   Procedure: COLONOSCOPY WITH PROPOFOL;  Surgeon: Jerene Bears, MD;  Location: [facility];  Service: Gastroenterology;  Laterality: N/A;  . [place]; 2007   "1 + 1"  . ESOPHAGOGASTRODUODENOSCOPY (EGD) WITH PROPOFOL N/A 03/13/2019   Procedure: ESOPHAGOGASTRODUODENOSCOPY (EGD) WITH PROPOFOL;  Surgeon: Jerene Bears, MD;  Location: [facility] ENDOSCOPY;  Service: Gastroenterology;  Laterality: N/A;  . ESOPHAGOGASTRODUODENOSCOPY (EGD) WITH PROPOFOL N/A 05/27/2020   Procedure: ESOPHAGOGASTRODUODENOSCOPY (EGD) WITH PROPOFOL;  Surgeon: [name], DO;  [facility];  Service: Endoscopy;  Laterality: N/A;  . EYE SURGERY Bilateral    [place]  . HEMOSTASIS CLIP PLACEMENT  03/13/2019   Procedure: HEMOSTASIS CLIP PLACEMENT;  Surgeon: Jerene Bears, MD;  Location: [facility] ENDOSCOPY;  Service: Gastroenterology;;  . KNEE ARTHROSCOPY Left 10/25/2006  . POLYPECTOMY  03/13/2019   Procedure: POLYPECTOMY;  Surgeon: Jerene Bears, MD;  Location: [facility];  Service: Gastroenterology;;  . RIGHT HEART CATH N/A 07/24/2017   Procedure: RIGHT HEART CATH;  Surgeon: Jolaine Artist, MD;  Location: [facility];  Service: Cardiovascular;  Laterality: N/A;  . RIGHT HEART CATHETERIZATION N/A 09/22/2013   Procedure: RIGHT HEART CATH;  Surgeon: Jolaine Artist, MD;  Location: [facility] C[facility] LAB;  Service: Cardiovascular;  Laterality: N/A;  . SHOULDER ARTHROSCOPY WITH OPEN ROTATOR CUFF REPAIR Right 03/14/2014   Procedure: RIGHT SHOULDER ARTHROSCOPY WITH BICEPS RELEASE, OPEN SUBSCAPULA REPAIR, OPEN SUPRASPINATUS REPAIR.;  Surgeon: Meredith Pel, MD;  Location: [facility];  Service: Orthopedics;  Laterality: Right;  . TOE AMPUTATION Right 02/24/2018   GREAT TOE AND 2ND TOE AMPUTATION  . TUBAL LIGATION  1970's   Social History   Occupational History    Employer: UNEMPLOYED  Tobacco Use  . Smoking status: Former Smoker    Packs/day: 3.00    Years: 32.00    Pack years: 96.00    Types: Cigarettes    Quit date: 10/24/1997    Years since quitting: 22.8  . Smokeless tobacco: Never Used  Vaping Use  . Vaping Use: Never used  Substance and Sexual Activity  . Alcohol use: Not Currently    Comment: "might have 2-3 daiquiris in the summer"  . Drug use: No  . Sexual activity: Never

## 2020-08-10 LAB — URIC ACID: Uric Acid, Serum: 4.7 mg/dL (ref 2.5–7.0)

## 2020-08-12 ENCOUNTER — Other Ambulatory Visit: Payer: Self-pay | Admitting: Family Medicine

## 2020-08-13 ENCOUNTER — Encounter: Payer: Self-pay | Admitting: Nurse Practitioner

## 2020-08-13 ENCOUNTER — Other Ambulatory Visit: Payer: Self-pay

## 2020-08-13 ENCOUNTER — Telehealth (HOSPITAL_COMMUNITY): Payer: Self-pay | Admitting: *Deleted

## 2020-08-13 ENCOUNTER — Emergency Department (HOSPITAL_COMMUNITY): Payer: HMO

## 2020-08-13 ENCOUNTER — Ambulatory Visit (INDEPENDENT_AMBULATORY_CARE_PROVIDER_SITE_OTHER): Payer: HMO | Admitting: Nurse Practitioner

## 2020-08-13 ENCOUNTER — Inpatient Hospital Stay (HOSPITAL_COMMUNITY)
Admission: EM | Admit: 2020-08-13 | Discharge: 2020-08-19 | DRG: 291 | Disposition: A | Discharge status: Home Health | Payer: HMO | Attending: Internal Medicine | Admitting: Internal Medicine

## 2020-08-13 ENCOUNTER — Encounter (HOSPITAL_COMMUNITY): Payer: Self-pay | Admitting: Emergency Medicine

## 2020-08-13 ENCOUNTER — Telehealth: Payer: Self-pay | Admitting: Nurse Practitioner

## 2020-08-13 VITALS — BP 132/82 | HR 76 | Temp 97.3°F | Ht 66.0 in | Wt 274.0 lb

## 2020-08-13 DIAGNOSIS — I160 Hypertensive urgency: Secondary | ICD-10-CM | POA: Diagnosis present

## 2020-08-13 DIAGNOSIS — N184 Chronic kidney disease, stage 4 (severe): Secondary | ICD-10-CM | POA: Diagnosis not present

## 2020-08-13 DIAGNOSIS — E872 Acidosis: Secondary | ICD-10-CM | POA: Diagnosis present

## 2020-08-13 DIAGNOSIS — Z8616 Personal history of COVID-19: Secondary | ICD-10-CM

## 2020-08-13 DIAGNOSIS — I2510 Atherosclerotic heart disease of native coronary artery without angina pectoris: Secondary | ICD-10-CM | POA: Diagnosis not present

## 2020-08-13 DIAGNOSIS — F32A Depression, unspecified: Secondary | ICD-10-CM | POA: Diagnosis present

## 2020-08-13 DIAGNOSIS — G4733 Obstructive sleep apnea (adult) (pediatric): Secondary | ICD-10-CM | POA: Diagnosis present

## 2020-08-13 DIAGNOSIS — Z20822 Contact with and (suspected) exposure to covid-19: Secondary | ICD-10-CM | POA: Diagnosis present

## 2020-08-13 DIAGNOSIS — E871 Hypo-osmolality and hyponatremia: Secondary | ICD-10-CM | POA: Diagnosis present

## 2020-08-13 DIAGNOSIS — E1122 Type 2 diabetes mellitus with diabetic chronic kidney disease: Secondary | ICD-10-CM | POA: Diagnosis present

## 2020-08-13 DIAGNOSIS — N189 Chronic kidney disease, unspecified: Secondary | ICD-10-CM | POA: Diagnosis not present

## 2020-08-13 DIAGNOSIS — Z7982 Long term (current) use of aspirin: Secondary | ICD-10-CM

## 2020-08-13 DIAGNOSIS — I5032 Chronic diastolic (congestive) heart failure: Secondary | ICD-10-CM

## 2020-08-13 DIAGNOSIS — Z9641 Presence of insulin pump (external) (internal): Secondary | ICD-10-CM | POA: Diagnosis present

## 2020-08-13 DIAGNOSIS — N179 Acute kidney failure, unspecified: Secondary | ICD-10-CM | POA: Diagnosis present

## 2020-08-13 DIAGNOSIS — E039 Hypothyroidism, unspecified: Secondary | ICD-10-CM | POA: Diagnosis present

## 2020-08-13 DIAGNOSIS — E785 Hyperlipidemia, unspecified: Secondary | ICD-10-CM | POA: Diagnosis present

## 2020-08-13 DIAGNOSIS — I872 Venous insufficiency (chronic) (peripheral): Secondary | ICD-10-CM | POA: Diagnosis present

## 2020-08-13 DIAGNOSIS — L89313 Pressure ulcer of right buttock, stage 3: Secondary | ICD-10-CM | POA: Diagnosis present

## 2020-08-13 DIAGNOSIS — E1151 Type 2 diabetes mellitus with diabetic peripheral angiopathy without gangrene: Secondary | ICD-10-CM | POA: Diagnosis present

## 2020-08-13 DIAGNOSIS — R82998 Other abnormal findings in urine: Secondary | ICD-10-CM | POA: Diagnosis not present

## 2020-08-13 DIAGNOSIS — D649 Anemia, unspecified: Secondary | ICD-10-CM | POA: Diagnosis not present

## 2020-08-13 DIAGNOSIS — N390 Urinary tract infection, site not specified: Secondary | ICD-10-CM | POA: Diagnosis present

## 2020-08-13 DIAGNOSIS — R7989 Other specified abnormal findings of blood chemistry: Secondary | ICD-10-CM | POA: Diagnosis present

## 2020-08-13 DIAGNOSIS — E1140 Type 2 diabetes mellitus with diabetic neuropathy, unspecified: Secondary | ICD-10-CM | POA: Diagnosis present

## 2020-08-13 DIAGNOSIS — M109 Gout, unspecified: Secondary | ICD-10-CM | POA: Diagnosis present

## 2020-08-13 DIAGNOSIS — L89323 Pressure ulcer of left buttock, stage 3: Secondary | ICD-10-CM | POA: Diagnosis present

## 2020-08-13 DIAGNOSIS — Z6841 Body Mass Index (BMI) 40.0 and over, adult: Secondary | ICD-10-CM | POA: Diagnosis not present

## 2020-08-13 DIAGNOSIS — I130 Hypertensive heart and chronic kidney disease with heart failure and stage 1 through stage 4 chronic kidney disease, or unspecified chronic kidney disease: Principal | ICD-10-CM | POA: Diagnosis present

## 2020-08-13 DIAGNOSIS — Z87440 Personal history of urinary (tract) infections: Secondary | ICD-10-CM

## 2020-08-13 DIAGNOSIS — I252 Old myocardial infarction: Secondary | ICD-10-CM

## 2020-08-13 DIAGNOSIS — I5033 Acute on chronic diastolic (congestive) heart failure: Secondary | ICD-10-CM | POA: Diagnosis present

## 2020-08-13 DIAGNOSIS — D631 Anemia in chronic kidney disease: Secondary | ICD-10-CM | POA: Diagnosis present

## 2020-08-13 DIAGNOSIS — E875 Hyperkalemia: Secondary | ICD-10-CM | POA: Diagnosis present

## 2020-08-13 DIAGNOSIS — Z79899 Other long term (current) drug therapy: Secondary | ICD-10-CM

## 2020-08-13 DIAGNOSIS — Z89411 Acquired absence of right great toe: Secondary | ICD-10-CM

## 2020-08-13 DIAGNOSIS — K5900 Constipation, unspecified: Secondary | ICD-10-CM | POA: Diagnosis not present

## 2020-08-13 DIAGNOSIS — I428 Other cardiomyopathies: Secondary | ICD-10-CM | POA: Diagnosis present

## 2020-08-13 DIAGNOSIS — I25119 Atherosclerotic heart disease of native coronary artery with unspecified angina pectoris: Secondary | ICD-10-CM | POA: Diagnosis present

## 2020-08-13 DIAGNOSIS — I509 Heart failure, unspecified: Secondary | ICD-10-CM

## 2020-08-13 DIAGNOSIS — Z7902 Long term (current) use of antithrombotics/antiplatelets: Secondary | ICD-10-CM

## 2020-08-13 DIAGNOSIS — Z90710 Acquired absence of both cervix and uterus: Secondary | ICD-10-CM

## 2020-08-13 DIAGNOSIS — I10 Essential (primary) hypertension: Secondary | ICD-10-CM | POA: Diagnosis not present

## 2020-08-13 DIAGNOSIS — Z79890 Hormone replacement therapy (postmenopausal): Secondary | ICD-10-CM

## 2020-08-13 DIAGNOSIS — E1165 Type 2 diabetes mellitus with hyperglycemia: Secondary | ICD-10-CM | POA: Diagnosis present

## 2020-08-13 DIAGNOSIS — L299 Pruritus, unspecified: Secondary | ICD-10-CM | POA: Diagnosis not present

## 2020-08-13 DIAGNOSIS — N1832 Chronic kidney disease, stage 3b: Secondary | ICD-10-CM | POA: Diagnosis not present

## 2020-08-13 DIAGNOSIS — Z89421 Acquired absence of other right toe(s): Secondary | ICD-10-CM

## 2020-08-13 DIAGNOSIS — B9620 Unspecified Escherichia coli [E. coli] as the cause of diseases classified elsewhere: Secondary | ICD-10-CM | POA: Diagnosis present

## 2020-08-13 DIAGNOSIS — E11649 Type 2 diabetes mellitus with hypoglycemia without coma: Secondary | ICD-10-CM | POA: Diagnosis not present

## 2020-08-13 DIAGNOSIS — R0602 Shortness of breath: Secondary | ICD-10-CM | POA: Diagnosis not present

## 2020-08-13 DIAGNOSIS — Z794 Long term (current) use of insulin: Secondary | ICD-10-CM

## 2020-08-13 DIAGNOSIS — M159 Polyosteoarthritis, unspecified: Secondary | ICD-10-CM | POA: Diagnosis present

## 2020-08-13 DIAGNOSIS — Z955 Presence of coronary angioplasty implant and graft: Secondary | ICD-10-CM

## 2020-08-13 DIAGNOSIS — Z8249 Family history of ischemic heart disease and other diseases of the circulatory system: Secondary | ICD-10-CM

## 2020-08-13 DIAGNOSIS — L8990 Pressure ulcer of unspecified site, unspecified stage: Secondary | ICD-10-CM | POA: Diagnosis present

## 2020-08-13 DIAGNOSIS — Z87891 Personal history of nicotine dependence: Secondary | ICD-10-CM

## 2020-08-13 DIAGNOSIS — Z8673 Personal history of transient ischemic attack (TIA), and cerebral infarction without residual deficits: Secondary | ICD-10-CM

## 2020-08-13 DIAGNOSIS — N281 Cyst of kidney, acquired: Secondary | ICD-10-CM | POA: Diagnosis not present

## 2020-08-13 DIAGNOSIS — Z9111 Patient's noncompliance with dietary regimen: Secondary | ICD-10-CM

## 2020-08-13 LAB — CBC
HCT: 30.8 % — ABNORMAL LOW (ref 36.0–46.0)
Hemoglobin: 9.4 g/dL — ABNORMAL LOW (ref 12.0–15.0)
MCH: 26.4 pg (ref 26.0–34.0)
MCHC: 30.5 g/dL (ref 30.0–36.0)
MCV: 86.5 fL (ref 80.0–100.0)
Platelets: 167 10*3/uL (ref 150–400)
RBC: 3.56 MIL/uL — ABNORMAL LOW (ref 3.87–5.11)
RDW: 18.3 % — ABNORMAL HIGH (ref 11.5–15.5)
WBC: 5.8 10*3/uL (ref 4.0–10.5)
nRBC: 0 % (ref 0.0–0.2)

## 2020-08-13 LAB — COMPLETE METABOLIC PANEL WITH GFR
AG Ratio: 1.2 (calc) (ref 1.0–2.5)
ALT: 52 U/L — ABNORMAL HIGH (ref 6–29)
AST: 26 U/L (ref 10–35)
Albumin: 3.2 g/dL — ABNORMAL LOW (ref 3.6–5.1)
Alkaline phosphatase (APISO): 273 U/L — ABNORMAL HIGH (ref 37–153)
BUN/Creatinine Ratio: 25 (calc) — ABNORMAL HIGH (ref 6–22)
BUN: 104 mg/dL — ABNORMAL HIGH (ref 7–25)
CO2: 18 mmol/L — ABNORMAL LOW (ref 20–32)
Calcium: 8.6 mg/dL (ref 8.6–10.4)
Chloride: 100 mmol/L (ref 98–110)
Creat: 4.17 mg/dL — ABNORMAL HIGH (ref 0.60–0.93)
GFR, Est African American: 12 mL/min/{1.73_m2} — ABNORMAL LOW (ref >=60)
GFR, Est Non African American: 10 mL/min/{1.73_m2} — ABNORMAL LOW (ref >=60)
Globulin: 2.6 g/dL (calc) (ref 1.9–3.7)
Glucose, Bld: 215 mg/dL — ABNORMAL HIGH (ref 65–99)
Potassium: 5.8 mmol/L — ABNORMAL HIGH (ref 3.5–5.3)
Sodium: 132 mmol/L — ABNORMAL LOW (ref 135–146)
Total Bilirubin: 0.8 mg/dL (ref 0.2–1.2)
Total Protein: 5.8 g/dL — ABNORMAL LOW (ref 6.1–8.1)

## 2020-08-13 LAB — CBC WITH DIFFERENTIAL/PLATELET
Absolute Monocytes: 643 cells/uL (ref 200–950)
Basophils Absolute: 29 cells/uL (ref 0–200)
Basophils Relative: 0.6 %
Eosinophils Absolute: 202 cells/uL (ref 15–500)
Eosinophils Relative: 4.2 %
HCT: 28.9 % — ABNORMAL LOW (ref 35.0–45.0)
Hemoglobin: 9 g/dL — ABNORMAL LOW (ref 11.7–15.5)
Lymphs Abs: 485 cells/uL — ABNORMAL LOW (ref 850–3900)
MCH: 26.4 pg — ABNORMAL LOW (ref 27.0–33.0)
MCHC: 31.1 g/dL — ABNORMAL LOW (ref 32.0–36.0)
MCV: 84.8 fL (ref 80.0–100.0)
MPV: 13.4 fL — ABNORMAL HIGH (ref 7.5–12.5)
Monocytes Relative: 13.4 %
Neutro Abs: 3442 cells/uL (ref 1500–7800)
Neutrophils Relative %: 71.7 %
Platelets: 155 10*3/uL (ref 140–400)
RBC: 3.41 10*6/uL — ABNORMAL LOW (ref 3.80–5.10)
RDW: 16.7 % — ABNORMAL HIGH (ref 11.0–15.0)
Total Lymphocyte: 10.1 %
WBC: 4.8 10*3/uL (ref 3.8–10.8)

## 2020-08-13 LAB — BRAIN NATRIURETIC PEPTIDE: Brain Natriuretic Peptide: 814 pg/mL — ABNORMAL HIGH (ref <100)

## 2020-08-13 LAB — BASIC METABOLIC PANEL
Anion gap: 11 (ref 5–15)
BUN: 104 mg/dL — ABNORMAL HIGH (ref 8–23)
CO2: 19 mmol/L — ABNORMAL LOW (ref 22–32)
Calcium: 9 mg/dL (ref 8.9–10.3)
Chloride: 101 mmol/L (ref 98–111)
Creatinine, Ser: 3.91 mg/dL — ABNORMAL HIGH (ref 0.44–1.00)
GFR, Estimated: 12 mL/min — ABNORMAL LOW (ref >60)
Glucose, Bld: 333 mg/dL — ABNORMAL HIGH (ref 70–99)
Potassium: 5.7 mmol/L — ABNORMAL HIGH (ref 3.5–5.1)
Sodium: 131 mmol/L — ABNORMAL LOW (ref 135–145)

## 2020-08-13 MED ORDER — SODIUM ZIRCONIUM CYCLOSILICATE 10 G PO PACK
10.0000 g | PACK | Freq: Once | ORAL | Status: AC
  Administered 2020-08-14: 10 g via ORAL
  Filled 2020-08-13: qty 1

## 2020-08-13 NOTE — Telephone Encounter (Signed)
Called patient and discussed lab results.  BNP is >800, showing fluid overload.  Kidney function has decreased significantly and kidneys are failing; potassium is elevated and she is anemic.  She needs to go to ER ASAP; patient verbalized understanding.  She refuses to call EMS and refuses for me to call EMS; wishes to wait until her husband gets home.

## 2020-08-13 NOTE — Assessment & Plan Note (Signed)
Chronic.  Weight is up 10 lbs from last visit within the system ~1 week ago.  Given dark urine color, concern for acute kidney injury.  Appears to be euvolemic on examination despite weight increase.  STAT labs checked including CMP, BNP, and CBC.  Will need to schedule close follow up likely with CHF clinic/nephrologist.

## 2020-08-13 NOTE — ED Provider Notes (Signed)
[place] [facility] EMERGENCY DEPARTMENT Provider Note   CSN: [id number] Arrival date & time: 08/13/20  1847   History Chief Complaint  Patient presents with  . Abnormal Lab    Susan [name] is a 70 y.o. female.  The history is provided by the patient.  Abnormal Lab She has history of hypertension, diabetes, hyperlipidemia, chronic kidney disease, diastolic heart failure, coronary artery disease and was sent here by her primary care provider because of elevated creatinine.  Patient states that over the last week, she has gained approximately 11 pounds.  During this time, she has had some dyspnea and some intermittent chest pain.  She recently had her dose of torsemide increased to 100 mg in the morning and 80 mg in the evening.  She started having brown urine about 4 days ago and was worried that she had a UTI so she went to her primary care provider who ran labs and found hemoglobin 9.0, BNP 814, BUN 104, creatinine 4.17, potassium 5.8.  Past Medical History:  Diagnosis Date  . Acute MI ([place]) 1999; 2007  . Anemia    hx  . Anginal pain ([place])   . Anxiety   . ARF (acute renal failure) ([place]) 06/2017   [facility]  . Arthritis    "generalized" (03/15/2014)  . CAD (coronary artery disease)    MI in 2000 - MI  2007 - treated bare metal stent (no nuclear since then as 9/11)  . Carotid artery disease ([place])   . CHF (congestive heart failure) ([place])   . Chronic diastolic heart failure (HCC)    a) ECHO (08/2013) EF 55-60% and RV function nl b) RHC (08/2013) RA 4, RV 30/5/7, PA 25/10 (16), PCWP 7, Fick CO/CI 6.3/2.7, PVR 1.5 WU, PA 61 and 66%  . Daily headache    "~ every other day; since I fell in June" (03/15/2014)  . Depression   . Dyslipidemia   . Dyspnea   . Exertional shortness of breath   . HTN (hypertension)   . Hypothyroidism   . Neuropathy   . Obesity   . Osteoarthritis   . Peripheral neuropathy   . PONV (postoperative nausea and vomiting)   . RBBB (right  bundle branch block)    Old  . Stroke [facility])    mini strokes  . Syncope    likely due to low blood sugar  . Tachycardia    Sinus tachycardia  . Type II diabetes mellitus (HCC)    Type II  . Urinary incontinence   . Venous insufficiency     Patient Active Problem List   Diagnosis Date Noted  . Chronic kidney disease (CKD) 05/25/2020  . Hypoalbuminemia 05/25/2020  . GERD (gastroesophageal reflux disease) 05/25/2020  . Pressure injury of skin 05/17/2020  . Obesity, Class III, BMI 40-49.9 (morbid obesity) ([place]) 03/07/2020  . Common bile duct (CBD) obstruction 05/28/2019  . Benign neoplasm of ascending colon   . Benign neoplasm of transverse colon   . Benign neoplasm of descending colon   . Benign neoplasm of sigmoid colon   . Gastric polyps   . Prolonged QT interval 03/11/2019  . Onychomycosis 06/21/2018  . Osteomyelitis of second toe of right foot ([place])   . 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 ([place])   . PVD (peripheral vascular disease) ([place]) 10/26/2017  . Hypothyroidism 07/27/2017  . PAH (pulmonary artery hypertension) ([place])   . Impaired ambulation 07/19/2017  . Leg cramps 02/27/2017  . Peripheral  edema 01/12/2017  . Diabetic neuropathy ([place]) 11/12/2016  . 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 mellitus ([place]) 10/24/2015  . Anemia 10/03/2015  . Generalized anxiety disorder 10/03/2015  . Insomnia 10/03/2015  . Hyperglycemia due to diabetes mellitus ([place]) 06/07/2015  . Chronic diastolic CHF (congestive heart failure) ([place]) 06/07/2015  . Non compliance with medical treatment 04/17/2014  . Rotator cuff tear 03/14/2014  . Class 3 obesity 09/23/2013  . Hypotension 12/25/2012  . Urinary incontinence   . MDD (major depressive disorder) 11/12/2010  . RBBB (right bundle branch block)   . CAD (coronary artery disease)   . Hyperlipemia 01/22/2009  . Essential hypertension 01/22/2009    Past Surgical History:  Procedure Laterality Date  . ABDOMINAL  HYSTERECTOMY  1980's  . AMPUTATION Right 02/24/2018   Procedure: RIGHT FOOT GREAT TOE AND 2ND TOE AMPUTATION;  Surgeon: Newt Minion, MD;  Location: [facility];  Service: Orthopedics;  Laterality: Right;  . AMPUTATION Right 04/30/2018   Procedure: RIGHT TRANSMETATARSAL AMPUTATION;  Surgeon: Newt Minion, MD;  Location: [facility];  Service: Orthopedics;  Laterality: Right;  . BIOPSY  05/27/2020   Procedure: BIOPSY;  Surgeon: [name], DO;  [facility];  Service: Endoscopy;;  . CATARACT EXTRACTION, BILATERAL Bilateral ?2013  . COLONOSCOPY W/ POLYPECTOMY    . COLONOSCOPY WITH PROPOFOL N/A 03/13/2019   Procedure: COLONOSCOPY WITH PROPOFOL;  Surgeon: Jerene Bears, MD;  Location: [facility];  Service: Gastroenterology;  Laterality: N/A;  . [place]; 2007   "1 + 1"  . ESOPHAGOGASTRODUODENOSCOPY (EGD) WITH PROPOFOL N/A 03/13/2019   Procedure: ESOPHAGOGASTRODUODENOSCOPY (EGD) WITH PROPOFOL;  Surgeon: Jerene Bears, MD;  Location: [facility] ENDOSCOPY;  Service: Gastroenterology;  Laterality: N/A;  . ESOPHAGOGASTRODUODENOSCOPY (EGD) WITH PROPOFOL N/A 05/27/2020   Procedure: ESOPHAGOGASTRODUODENOSCOPY (EGD) WITH PROPOFOL;  Surgeon: [name], DO;  [facility];  Service: Endoscopy;  Laterality: N/A;  . EYE SURGERY Bilateral    [place]  . HEMOSTASIS CLIP PLACEMENT  03/13/2019   Procedure: HEMOSTASIS CLIP PLACEMENT;  Surgeon: Jerene Bears, MD;  Location[facility] ENDOSCOPY;  Service: Gastroenterology;;  . KNEE ARTHROSCOPY Left 10/25/2006  . POLYPECTOMY  03/13/2019   Procedure: POLYPECTOMY;  Surgeon: Jerene Bears, MD;  Location: [facility] [place];  Service: Gastroenterology;;  . RIGHT HEART CATH N/A 07/24/2017   Procedure: RIGHT HEART CATH;  Surgeon: Jolaine Artist, MD;  Location: [facility] CV LAB;  Service: Cardiovascular;  Laterality: N/A;  . RIGHT HEART CATHETERIZATION N/A 09/22/2013   Procedure: RIGHT HEART CATH;  Surgeon: Jolaine Artist,  MD;  Location: [facility] CATH LAB;  Service: Cardiovascular;  Laterality: N/A;  . SHOULDER ARTHROSCOPY WITH OPEN ROTATOR CUFF REPAIR Right 03/14/2014   Procedure: RIGHT SHOULDER ARTHROSCOPY WITH BICEPS RELEASE, OPEN SUBSCAPULA REPAIR, OPEN SUPRASPINATUS REPAIR.;  Surgeon: Meredith Pel, MD;  Location: [place];  Service: Orthopedics;  Laterality: Right;  . TOE AMPUTATION Right 02/24/2018   GREAT TOE AND 2ND TOE AMPUTATION  . TUBAL LIGATION  1970's     OB History   No obstetric history on file.     Family History  Problem Relation Age of Onset  . Heart attack Mother 4    Social History   Tobacco Use  . Smoking status: Former Smoker    Packs/day: 3.00    Years: 32.00    Pack years: 96.00    Types: Cigarettes    Quit date: 10/24/1997    Years since quitting:  22.8  . Smokeless tobacco: Never Used  Vaping Use  . Vaping Use: Never used  Substance Use Topics  . Alcohol use: Not Currently    Comment: "might have 2-3 daiquiris in the summer"  . Drug use: No    Home Medications Prior to Admission medications   Medication Sig Start Date End Date Taking? Authorizing Provider  albuterol (VENTOLIN HFA) 108 (90 Base) MCG/ACT inhaler TAKE 2 PUFFS BY MOUTH EVERY 6 HOURS AS NEEDED FOR WHEEZE OR SHORTNESS OF BREATH 06/27/19   [place], [name], MD  allopurinol (ZYLOPRIM) 100 MG tablet TAKE 1 TABLET BY MOUTH TWICE A DAY 08/02/19   Newt Minion, MD  Ascorbic Acid (VITAMIN C) 1000 MG tablet Take 1,000 mg by mouth daily.    [provider]  aspirin EC 81 MG tablet Take 1 tablet (81 mg total) by mouth daily with breakfast. 05/28/20   [place], Courage, MD  Bismuth Tribromoph-Petrolatum (XEROFORM PETROLAT GAUZE 5"X9") MISC Apply 1 each topically 2 (two) times daily. For dressing changes 05/28/20   Roxan Hockey, MD  buPROPion (WELLBUTRIN XL) 150 MG 24 hr tablet Take 1 tablet (150 mg total) by mouth daily. For depression 06/30/20   Alycia Rossetti, MD  carvedilol (COREG) 12.5 MG tablet TAKE 1  TABLET (12.5 MG TOTAL) BY MOUTH 2 (TWO) TIMES DAILY WITH A MEAL. 08/13/20   [name], MD  cholecalciferol (VITAMIN D) 1000 units tablet Take 1,000 Units by mouth daily with supper.     [provider]  clopidogrel (PLAVIX) 75 MG tablet Take 1 tablet (75 mg total) by mouth daily with breakfast. 05/23/20   Clegg, Amy D, NP  ferrous sulfate 325 (65 FE) MG tablet Take 1 tablet (325 mg total) by mouth 2 (two) times daily with a meal. Reported on 08/21/2015 06/07/18   Alycia Rossetti, MD  FLUoxetine (PROZAC) 10 MG capsule 1 capsule    [provider]  guaiFENesin-dextromethorphan (ROBITUSSIN DM) 100-10 MG/5ML syrup Take 10 mLs by mouth every 4 (four) hours as needed for cough. 03/13/20   [place] Donnamarie Poag, DO  Insulin Disposable Pump (OMNIPOD DASH 5 PACK PODS) MISC Inject into the skin See admin instructions. Use continuously with Novolin R - change every 72 hours. 11/18/18   [provider]  insulin NPH Human (HUMULIN N,NOVOLIN N) 100 UNIT/ML injection Inject 10-15 Units into the skin See admin instructions. Inject 15 units subcutaneously in the morning if CBG >300; inject 10 units 200-300    [provider]  insulin regular (NOVOLIN R) 100 units/mL injection Inject into the skin See admin instructions. Manually add bolus to continuous dose via OmniPod 3 times daily per sliding scale (CBG 80-150 7 units, 151-200 9 units, 201-250 12 units, 251-300 14 units, 301- 400 17 units)    [provider]  levothyroxine (SYNTHROID, LEVOTHROID) 50 MCG tablet Take 1 tablet (50 mcg total) by mouth daily before breakfast. 11/07/16   Dena Billet B, PA-C  magnesium oxide (MAG-OX) 400 MG tablet TAKE 1 TABLET BY MOUTH EVERY DAY 07/23/20   [place], [name], MD  nitroGLYCERIN (NITROSTAT) 0.4 MG SL tablet Place 1 tablet (0.4 mg total) under the tongue every 5 (five) minutes as needed for chest pain. 04/28/19   Clegg, Amy D, NP  ondansetron (ZOFRAN) 4 MG tablet Take 1 tablet (4 mg  total) by mouth every 8 (eight) hours as needed for nausea or vomiting. 05/28/20 05/28/21  Roxan Hockey, MD  [facility] VERIO test strip 3 (three) times  daily. 07/25/20   [provider]  potassium chloride SA (KLOR-CON) 20 MEQ tablet Take 20 mEq by mouth daily.    [provider]  pregabalin (LYRICA) 150 MG capsule Take 1 capsule (150 mg total) by mouth 2 (two) times daily. 11/12/16   Alycia Rossetti, MD  torsemide (DEMADEX) 100 MG tablet Take 100 mg by mouth daily. 100 mg in the AM and 80 mg in the PM    [provider]  traZODone (DESYREL) 100 MG tablet TAKE 1 AND 1/2 TABLETS BY MOUTH AT BEDTIME AS NEEDED FOR SLEEP. 01/26/20   Alycia Rossetti, MD  ursodiol (ACTIGALL) 500 MG tablet Take 500 mg by mouth 3 (three) times daily. 09/09/19   [provider]    Allergies    Codeine  Review of Systems   Review of Systems  All other systems reviewed and are negative.   Physical Exam Updated Vital Signs BP 140/76   Pulse 89   Temp 97.9 F (36.6 C) (Oral)   Resp 17   SpO2 98%   Physical Exam Vitals and nursing note reviewed.   70 year old female, resting comfortably and in no acute distress. Vital signs are significant for borderline elevated blood pressure. Oxygen saturation is 98%, which is normal. Head is normocephalic and atraumatic. PERRLA, EOMI. Oropharynx is clear. Neck is nontender and supple without adenopathy or JVD. Back is nontender and there is no CVA tenderness. Lungs are clear without rales, wheezes, or rhonchi. Chest is nontender. Heart has regular rate and rhythm without murmur. Abdomen is soft, flat, nontender without masses or hepatosplenomegaly and peristalsis is normoactive. Extremities have 3+ pretibial edema, full range of motion is present. Skin is warm and dry without rash. Neurologic: Mental status is normal, cranial nerves are intact, there are no motor or sensory deficits.  ED Results / Procedures / Treatments   Labs (all  labs ordered are listed, but only abnormal results are displayed) Labs Reviewed  BASIC METABOLIC PANEL - Abnormal; Notable for the following components:      Result Value   Sodium 131 (*)    Potassium 5.7 (*)    CO2 19 (*)    Glucose, Bld 333 (*)    BUN 104 (*)    Creatinine, Ser 3.91 (*)    GFR, Estimated 12 (*)    All other components within normal limits  CBC - Abnormal; Notable for the following components:   RBC 3.56 (*)    Hemoglobin 9.4 (*)    HCT 30.8 (*)    RDW 18.3 (*)    All other components within normal limits    EKG EKG Interpretation  Date/Time:  Tuesday August 14 2020 00:06:54 EDT Ventricular Rate:  85 PR Interval:    QRS Duration: 124 QT Interval:  387 QTC Calculation: 461 R Axis:   -24 Text Interpretation: Sinus rhythm Probable left atrial enlargement Right bundle branch block Inferior infarct, old When compared with ECG of 08/13/2020, Incomplete right bundle branch block has progressed to complete Right bundle branch block Confirmed by Delora Fuel ([id number]) on 08/14/2020 12:11:39 AM   Radiology DG Chest 2 View  Result Date: 08/13/2020 CLINICAL DATA:  Shortness of breath EXAM: CHEST - 2 VIEW COMPARISON:  None. FINDINGS: The heart size and mediastinal contours are within normal limits. Patchy airspace opacity seen at the right lung base. Aortic knob calcifications are noted. The visualized skeletal structures are unremarkable. IMPRESSION: Patchy airspace opacity at the right lung base which may be  due to atelectasis or infectious etiology Electronically Signed   By: Prudencio Pair M.D.   On: 08/13/2020 21:09    Procedures Procedures   Medications Ordered in ED Medications - No data to display  ED Course  I have reviewed the triage vital signs and the nursing notes.  Pertinent labs & imaging results that were available during my care of the patient were reviewed by me and considered in my medical decision making (see chart for details).  MDM  Rules/Calculators/A&P CHF exacerbation with acute kidney injury.  Labs in the ED showed creatinine of 3.91, potassium 5.7, sodium 131, hemoglobin 9.4.  On 3/1, creatinine was 2.27.  On 2/10, hemoglobin was 11.0.  Chest x-ray shows cardiomegaly per my evaluation, even though radiologist states heart size is normal.  ECG shows no changes of hyperkalemia.  She is given a dose of sodium zirconium cyclosilicate for her hyperkalemia.  She is clearly fluid overloaded, and I suspect that her renal function would improve with more aggressive diuresis, but it is possible that aggressive diuresis will make her renal function worse.  She will need to be admitted for management of her fluid status and kidney injury.  She does take a potassium supplement, and hyperkalemia should be able to be managed with sodium zirconium cyclosilicate and stopping her potassium supplement.  I suspect that her anemia is related to worsening renal function.  Old records are reviewed, showing she is followed in the heart failure clinic, prior hospital admission for heart failure.  Case is discussed with Dr. Marlowe Sax of [facility], who agrees to admit the patient.  Final Clinical Impression(s) / ED Diagnoses Final diagnoses:  Acute renal failure superimposed on chronic kidney disease, unspecified CKD stage, unspecified acute renal failure type ([place])  Acute on chronic diastolic heart failure (HCC)  Normochromic normocytic anemia  Hyponatremia  Hyperkalemia    Rx / DC Orders ED Discharge Orders    None       Delora Fuel, MD 67/20/94 0110

## 2020-08-13 NOTE — Assessment & Plan Note (Signed)
>>  ASSESSMENT AND PLAN FOR CHRONIC DIASTOLIC CHF (CONGESTIVE HEART FAILURE) (HCC) WRITTEN ON 08/13/2020  2:06 PM BY MARTINEZ, [name] A, NP  Chronic.  Weight is up 10 lbs from last visit within the system ~1 week ago.  Given dark urine color, concern for acute kidney injury.  Appears to be euvolemic on examination despite weight increase.  STAT labs checked including CMP, BNP, and CBC.  Will need to schedule close follow up likely with CHF clinic/nephrologist.

## 2020-08-13 NOTE — Assessment & Plan Note (Addendum)
Chronic.  Last saw Nephrology in Nov. 2021.  Given decreased urine production, decreased appetite, dark urine, and up about 10 lbs in 1 week, concern for acute kidney injury on chronic kidney failure.  Overdue for labs with heart failure clinic as well.  Will check STAT BNP, CMP, and CBC.  Likely, will need to decrease Torsemide and have close follow up with CHF clinic and/or Nephrologist.

## 2020-08-13 NOTE — ED Triage Notes (Signed)
Patient here after getting abnormal labs from PCP.  Hgb 9, BNP 814, BUN 104, creatinine 4.17 and K 5.8.  No other complaints.

## 2020-08-13 NOTE — ED Provider Notes (Incomplete)
[facility] EMERGENCY DEPARTMENT Provider Note   CSN: [id number] Arrival date & time: 08/13/20  1847   History Chief Complaint  Patient presents with  . Abnormal Lab    Susan [name] is a 70 y.o. female.  The history is provided by the patient.  Abnormal Lab   Past Medical History:  Diagnosis Date  . Acute MI ([place]) 1999; 2007  . Anemia    hx  . Anginal pain ([place])   . Anxiety   . ARF (acute renal failure) ([place]) 06/2017   [facility]  . Arthritis    "generalized" (03/15/2014)  . CAD (coronary artery disease)    MI in 2000 - MI  2007 - treated bare metal stent (no nuclear since then as 9/11)  . Carotid artery disease ([place])   . CHF (congestive heart failure) ([place])   . Chronic diastolic heart failure (HCC)    a) ECHO (08/2013) EF 55-60% and RV function nl b) RHC (08/2013) RA 4, RV 30/5/7, PA 25/10 (16), PCWP 7, Fick CO/CI 6.3/2.7, PVR 1.5 WU, PA 61 and 66%  . Daily headache    "~ every other day; since I fell in June" (03/15/2014)  . Depression   . Dyslipidemia   . Dyspnea   . Exertional shortness of breath   . HTN (hypertension)   . Hypothyroidism   . Neuropathy   . Obesity   . Osteoarthritis   . Peripheral neuropathy   . PONV (postoperative nausea and vomiting)   . RBBB (right bundle branch block)    Old  . Stroke [facility])    mini strokes  . Syncope    likely due to low blood sugar  . Tachycardia    Sinus tachycardia  . Type II diabetes mellitus (HCC)    Type II  . Urinary incontinence   . Venous insufficiency     Patient Active Problem List   Diagnosis Date Noted  . Chronic kidney disease (CKD) 05/25/2020  . Hypoalbuminemia 05/25/2020  . GERD (gastroesophageal reflux disease) 05/25/2020  . Pressure injury of skin 05/17/2020  . Obesity, Class III, BMI 40-49.9 (morbid obesity) ([place]) 03/07/2020  . Common bile duct (CBD) obstruction 05/28/2019  . Benign neoplasm of ascending colon   . Benign neoplasm of transverse colon   .  Benign neoplasm of descending colon   . Benign neoplasm of sigmoid colon   . Gastric polyps   . Prolonged QT interval 03/11/2019  . Onychomycosis 06/21/2018  . Osteomyelitis of second toe of right foot ([place])   . 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 ([place])   . PVD (peripheral vascular disease) ([place]) 10/26/2017  . Hypothyroidism 07/27/2017  . PAH (pulmonary artery hypertension) ([place])   . Impaired ambulation 07/19/2017  . Leg cramps 02/27/2017  . Peripheral edema 01/12/2017  . Diabetic neuropathy ([place]) 11/12/2016  . 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 mellitus ([place]) 10/24/2015  . Anemia 10/03/2015  . Generalized anxiety disorder 10/03/2015  . Insomnia 10/03/2015  . Hyperglycemia due to diabetes mellitus ([employer]) 06/07/2015  . Chronic diastolic CHF (congestive heart failure) ([place]) 06/07/2015  . Non compliance with medical treatment 04/17/2014  . Rotator cuff tear 03/14/2014  . Class 3 obesity 09/23/2013  . Hypotension 12/25/2012  . Urinary incontinence   . MDD (major depressive disorder) 11/12/2010  . RBBB (right bundle branch block)   . CAD (coronary artery disease)   . Hyperlipemia 01/22/2009  . Essential hypertension 01/22/2009    Past  Surgical History:  Procedure Laterality Date  . ABDOMINAL HYSTERECTOMY  1980's  . AMPUTATION Right 02/24/2018   Procedure: RIGHT FOOT GREAT TOE AND 2ND TOE AMPUTATION;  Surgeon: Newt Minion, MD;  Location: [facility];  Service: Orthopedics;  Laterality: Right;  . AMPUTATION Right 04/30/2018   Procedure: RIGHT TRANSMETATARSAL AMPUTATION;  Surgeon: Newt Minion, MD;  Location: [facility];  Service: Orthopedics;  Laterality: Right;  . BIOPSY  05/27/2020   Procedure: BIOPSY;  Surgeon: [name], DO;  [facility];  Service: Endoscopy;;  . CATARACT EXTRACTION, BILATERAL Bilateral ?2013  . COLONOSCOPY W/ POLYPECTOMY    . COLONOSCOPY WITH PROPOFOL N/A 03/13/2019   Procedure: COLONOSCOPY WITH PROPOFOL;  Surgeon: Jerene Bears, MD;  Location: [facility];  Service: Gastroenterology;  Laterality: N/A;  . [place]; 2007   "1 + 1"  . ESOPHAGOGASTRODUODENOSCOPY (EGD) WITH PROPOFOL N/A 03/13/2019   Procedure: ESOPHAGOGASTRODUODENOSCOPY (EGD) WITH PROPOFOL;  Surgeon: Jerene Bears, MD;  Location: [facility] ENDOSCOPY;  Service: Gastroenterology;  Laterality: N/A;  . ESOPHAGOGASTRODUODENOSCOPY (EGD) WITH PROPOFOL N/A 05/27/2020   Procedure: ESOPHAGOGASTRODUODENOSCOPY (EGD) WITH PROPOFOL;  Surgeon: [name], DO;  [facility];  Service: Endoscopy;  Laterality: N/A;  . EYE SURGERY Bilateral    [place]  . HEMOSTASIS CLIP PLACEMENT  03/13/2019   Procedure: HEMOSTASIS CLIP PLACEMENT;  Surgeon: Jerene Bears, MD;  Location: [facility] ENDOSCOPY;  Service: Gastroenterology;;  . KNEE ARTHROSCOPY Left 10/25/2006  . POLYPECTOMY  03/13/2019   Procedure: POLYPECTOMY;  Surgeon: Jerene Bears, MD;  Location: [facility];  Service: Gastroenterology;;  . RIGHT HEART CATH N/A 07/24/2017   Procedure: RIGHT HEART CATH;  Surgeon: Jolaine Artist, MD;  Location: [facility];  Service: Cardiovascular;  Laterality: N/A;  . RIGHT HEART CATHETERIZATION N/A 09/22/2013   Procedure: RIGHT HEART CATH;  Surgeon: Jolaine Artist, MD;  Location[facility] CATH LAB;  Service: Cardiovascular;  Laterality: N/A;  . SHOULDER ARTHROSCOPY WITH OPEN ROTATOR CUFF REPAIR Right 03/14/2014   Procedure: RIGHT SHOULDER ARTHROSCOPY WITH BICEPS RELEASE, OPEN SUBSCAPULA REPAIR, OPEN SUPRASPINATUS REPAIR.;  Surgeon: Meredith Pel, MD;  Location: [facility];  Service: Orthopedics;  Laterality: Right;  . TOE AMPUTATION Right 02/24/2018   GREAT TOE AND 2ND TOE AMPUTATION  . TUBAL LIGATION  1970's     OB History   No obstetric history on file.     Family History  Problem Relation Age of Onset  . Heart attack Mother 28    Social History   Tobacco Use  . Smoking status: Former Smoker    Packs/day: 3.00    Years:  32.00    Pack years: 96.00    Types: Cigarettes    Quit date: 10/24/1997    Years since quitting: 22.8  . Smokeless tobacco: Never Used  Vaping Use  . Vaping Use: Never used  Substance Use Topics  . Alcohol use: Not Currently    Comment: "might have 2-3 daiquiris in the summer"  . Drug use: No    Home Medications Prior to Admission medications   Medication Sig Start Date End Date Taking? Authorizing Provider  albuterol (VENTOLIN HFA) 108 (90 Base) MCG/ACT inhaler TAKE 2 PUFFS BY MOUTH EVERY 6 HOURS AS NEEDED FOR WHEEZE OR SHORTNESS OF BREATH 06/27/19   Alycia Rossetti, MD  allopurinol (ZYLOPRIM) 100 MG tablet TAKE 1 TABLET BY MOUTH TWICE A DAY 08/02/19   Newt Minion, MD  Ascorbic Acid (  VITAMIN C) 1000 MG tablet Take 1,000 mg by mouth daily.    [provider]  aspirin EC 81 MG tablet Take 1 tablet (81 mg total) by mouth daily with breakfast. 05/28/20   [place], Courage, MD  Bismuth Tribromoph-Petrolatum (XEROFORM PETROLAT GAUZE 5"X9") MISC Apply 1 each topically 2 (two) times daily. For dressing changes 05/28/20   Roxan Hockey, MD  buPROPion (WELLBUTRIN XL) 150 MG 24 hr tablet Take 1 tablet (150 mg total) by mouth daily. For depression 06/30/20   Alycia Rossetti, MD  carvedilol (COREG) 12.5 MG tablet TAKE 1 TABLET (12.5 MG TOTAL) BY MOUTH 2 (TWO) TIMES DAILY WITH A MEAL. 08/13/20   [name], MD  cholecalciferol (VITAMIN D) 1000 units tablet Take 1,000 Units by mouth daily with supper.     [provider]  clopidogrel (PLAVIX) 75 MG tablet Take 1 tablet (75 mg total) by mouth daily with breakfast. 05/23/20   Clegg, Amy D, NP  ferrous sulfate 325 (65 FE) MG tablet Take 1 tablet (325 mg total) by mouth 2 (two) times daily with a meal. Reported on 08/21/2015 06/07/18   Alycia Rossetti, MD  FLUoxetine (PROZAC) 10 MG capsule 1 capsule    [provider]  guaiFENesin-dextromethorphan (ROBITUSSIN DM) 100-10 MG/5ML syrup Take 10 mLs by mouth every 4 (four) hours  as needed for cough. 03/13/20   [place] Donnamarie Poag, DO  Insulin Disposable Pump (OMNIPOD DASH 5 PACK PODS) MISC Inject into the skin See admin instructions. Use continuously with Novolin R - change every 72 hours. 11/18/18   [provider]  insulin NPH Human (HUMULIN N,NOVOLIN N) 100 UNIT/ML injection Inject 10-15 Units into the skin See admin instructions. Inject 15 units subcutaneously in the morning if CBG >300; inject 10 units 200-300    [provider]  insulin regular (NOVOLIN R) 100 units/mL injection Inject into the skin See admin instructions. Manually add bolus to continuous dose via OmniPod 3 times daily per sliding scale (CBG 80-150 7 units, 151-200 9 units, 201-250 12 units, 251-300 14 units, 301- 400 17 units)    [provider]  levothyroxine (SYNTHROID, LEVOTHROID) 50 MCG tablet Take 1 tablet (50 mcg total) by mouth daily before breakfast. 11/07/16   Dena Billet B, PA-C  magnesium oxide (MAG-OX) 400 MG tablet TAKE 1 TABLET BY MOUTH EVERY DAY 07/23/20   [place], [name], MD  nitroGLYCERIN (NITROSTAT) 0.4 MG SL tablet Place 1 tablet (0.4 mg total) under the tongue every 5 (five) minutes as needed for chest pain. 04/28/19   Clegg, Amy D, NP  ondansetron (ZOFRAN) 4 MG tablet Take 1 tablet (4 mg total) by mouth every 8 (eight) hours as needed for nausea or vomiting. 05/28/20 05/28/21  Roxan Hockey, MD  [facility] VERIO test strip 3 (three) times daily. 07/25/20   [provider]  potassium chloride SA (KLOR-CON) 20 MEQ tablet Take 20 mEq by mouth daily.    [provider]  pregabalin (LYRICA) 150 MG capsule Take 1 capsule (150 mg total) by mouth 2 (two) times daily. 11/12/16   Alycia Rossetti, MD  torsemide (DEMADEX) 100 MG tablet Take 100 mg by mouth daily. 100 mg in the AM and 80 mg in the PM    [provider]  traZODone (DESYREL) 100 MG tablet TAKE 1 AND 1/2 TABLETS BY MOUTH AT BEDTIME AS NEEDED FOR SLEEP. 01/26/20   Alycia Rossetti, MD   ursodiol (ACTIGALL) 500 MG tablet Take 500 mg by mouth  3 (three) times daily. 09/09/19   [provider]    Allergies    Codeine  Review of Systems   Review of Systems  All other systems reviewed and are negative.   Physical Exam Updated Vital Signs BP 140/76   Pulse 89   Temp 97.9 F (36.6 C) (Oral)   Resp 17   SpO2 98%   Physical Exam Vitals and nursing note reviewed.   *** year old ***female, resting comfortably and in no acute distress. Vital signs are ***. Oxygen saturation is ***%, which is normal. Head is normocephalic and atraumatic. PERRLA, EOMI. Oropharynx is clear. Neck is nontender and supple without adenopathy or JVD. Back is nontender and there is no CVA tenderness. Lungs are clear without rales, wheezes, or rhonchi. Chest is nontender. Heart has regular rate and rhythm without murmur. Abdomen is soft, flat, nontender without masses or hepatosplenomegaly and peristalsis is normoactive. Extremities have no cyanosis or edema, full range of motion is present. Skin is warm and dry without rash. Neurologic: Mental status is normal, cranial nerves are intact, there are no motor or sensory deficits.  ED Results / Procedures / Treatments   Labs (all labs ordered are listed, but only abnormal results are displayed) Labs Reviewed  BASIC METABOLIC PANEL - Abnormal; Notable for the following components:      Result Value   Sodium 131 (*)    Potassium 5.7 (*)    CO2 19 (*)    Glucose, Bld 333 (*)    BUN 104 (*)    Creatinine, Ser 3.91 (*)    GFR, Estimated 12 (*)    All other components within normal limits  CBC - Abnormal; Notable for the following components:   RBC 3.56 (*)    Hemoglobin 9.4 (*)    HCT 30.8 (*)    RDW 18.3 (*)    All other components within normal limits    EKG None  Radiology DG Chest 2 View  Result Date: 08/13/2020 CLINICAL DATA:  Shortness of breath EXAM: CHEST - 2 VIEW COMPARISON:  None. FINDINGS: The heart size and  mediastinal contours are within normal limits. Patchy airspace opacity seen at the right lung base. Aortic knob calcifications are noted. The visualized skeletal structures are unremarkable. IMPRESSION: Patchy airspace opacity at the right lung base which may be due to atelectasis or infectious etiology Electronically Signed   By: Prudencio Pair M.D.   On: 08/13/2020 21:09    Procedures Procedures {Remember to document critical care time when appropriate:1}  Medications Ordered in ED Medications - No data to display  ED Course  I have reviewed the triage vital signs and the nursing notes.  Pertinent labs & imaging results that were available during my care of the patient were reviewed by me and considered in my medical decision making (see chart for details).    MDM Rules/Calculators/A&P                          *** Final Clinical Impression(s) / ED Diagnoses Final diagnoses:  None    Rx / DC Orders ED Discharge Orders    None

## 2020-08-13 NOTE — Telephone Encounter (Signed)
Pts labs from pcp came back and creatinine is 4.17. Since pts weight is up 10lbs over the weekend and her creatinine has spiked per [name],NP-c patient needs to go to the emergency room. I spoke with Katie with paramedicine she will follow up with pt and advise.

## 2020-08-13 NOTE — Telephone Encounter (Signed)
-----   Message from Marylouise Stacks, EMT sent at 08/13/2020  3:15 PM EDT ----- [name],  Can you see if provider wants to see her-she has reported very dark colored urine all wknd and had 10lb weigh gain from last week, decreased urine output, she went to PCP today for it and they are running STAT labs--not sure the turn around time for those---I was going to go see her tomorrow unless she could get in to see provider ?

## 2020-08-13 NOTE — Progress Notes (Signed)
Subjective:    Patient ID: Susan [name], female    DOB: 05/06/1951, 70 y.o.   MRN: [id number]  HPI: Susan [name] is a 70 y.o. female presenting for dark urine.  Chief Complaint  Patient presents with  . urine problem    Believes she has uti due to dark color of urine   URINARY SYMPTOMS Duration: Friday 08/10/2020 Dysuria: no Urinary frequency: no Urgency: no  Small volume voids: yes; has only gone 1 time since taking fluid pill Symptom severity: no Urinary incontinence: not new for patient  Foul odor: no Hematuria: no Abdominal pain: no Back pain: no Suprapubic pain/pressure: no  Flank pain: no Fever:  No Nausea: no Vomiting: yes; Friday night   Diarrhea: no Decreased appetite: yes; has been "nibbling" Relief with cranberry juice: not tried Relief with pyridium: not tried Status: better/worse/stable Previous urinary tract infection: yes Recurrent urinary tract infection: no Vaginal discharge: no Treatments attempted: increasing fluids   CHRONIC KIDNEY DISEASE CKD status: previously improving slightly, baseline Creat approximately 1.75 mg/dL Medications renally dose: yes Previous renal evaluation: yes Pneumovax:  Up to Date Influenza Vaccine:  Up to Date  Target weight: 260 lbs  Allergies  Allergen Reactions  . Codeine Nausea And Vomiting    Outpatient Encounter Medications as of 08/13/2020  Medication Sig  . albuterol (VENTOLIN HFA) 108 (90 Base) MCG/ACT inhaler TAKE 2 PUFFS BY MOUTH EVERY 6 HOURS AS NEEDED FOR WHEEZE OR SHORTNESS OF BREATH  . allopurinol (ZYLOPRIM) 100 MG tablet TAKE 1 TABLET BY MOUTH TWICE A DAY  . Ascorbic Acid (VITAMIN C) 1000 MG tablet Take 1,000 mg by mouth daily.  Marland Kitchen aspirin EC 81 MG tablet Take 1 tablet (81 mg total) by mouth daily with breakfast.  . Bismuth Tribromoph-Petrolatum (XEROFORM PETROLAT GAUZE 5"X9") MISC Apply 1 each topically 2 (two) times daily. For dressing changes  . buPROPion (WELLBUTRIN XL) 150 MG 24 hr tablet Take  1 tablet (150 mg total) by mouth daily. For depression  . carvedilol (COREG) 12.5 MG tablet Take 1 tablet (12.5 mg total) by mouth 2 (two) times daily with a meal.  . cholecalciferol (VITAMIN D) 1000 units tablet Take 1,000 Units by mouth daily with supper.   . clopidogrel (PLAVIX) 75 MG tablet Take 1 tablet (75 mg total) by mouth daily with breakfast.  . ferrous sulfate 325 (65 FE) MG tablet Take 1 tablet (325 mg total) by mouth 2 (two) times daily with a meal. Reported on 08/21/2015  . FLUoxetine (PROZAC) 10 MG capsule 1 capsule  . guaiFENesin-dextromethorphan (ROBITUSSIN DM) 100-10 MG/5ML syrup Take 10 mLs by mouth every 4 (four) hours as needed for cough.  . Insulin Disposable Pump (OMNIPOD DASH 5 PACK PODS) MISC Inject into the skin See admin instructions. Use continuously with Novolin R - change every 72 hours.  . insulin NPH Human (HUMULIN N,NOVOLIN N) 100 UNIT/ML injection Inject 10-15 Units into the skin See admin instructions. Inject 15 units subcutaneously in the morning if CBG >300; inject 10 units 200-300  . insulin regular (NOVOLIN R) 100 units/mL injection Inject into the skin See admin instructions. Manually add bolus to continuous dose via OmniPod 3 times daily per sliding scale (CBG 80-150 7 units, 151-200 9 units, 201-250 12 units, 251-300 14 units, 301- 400 17 units)  . levothyroxine (SYNTHROID, LEVOTHROID) 50 MCG tablet Take 1 tablet (50 mcg total) by mouth daily before breakfast.  . magnesium oxide (MAG-OX) 400 MG tablet TAKE 1 TABLET BY MOUTH EVERY DAY  .  nitroGLYCERIN (NITROSTAT) 0.4 MG SL tablet Place 1 tablet (0.4 mg total) under the tongue every 5 (five) minutes as needed for chest pain.  Marland Kitchen ondansetron (ZOFRAN) 4 MG tablet Take 1 tablet (4 mg total) by mouth every 8 (eight) hours as needed for nausea or vomiting.  Glory Rosebush VERIO test strip 3 (three) times daily.  . potassium chloride SA (KLOR-CON) 20 MEQ tablet Take 20 mEq by mouth daily.  . pregabalin (LYRICA) 150 MG  capsule Take 1 capsule (150 mg total) by mouth 2 (two) times daily.  Marland Kitchen torsemide (DEMADEX) 100 MG tablet Take 100 mg by mouth daily. 100 mg in the AM and 80 mg in the PM  . traZODone (DESYREL) 100 MG tablet TAKE 1 AND 1/2 TABLETS BY MOUTH AT BEDTIME AS NEEDED FOR SLEEP.  Marland Kitchen ursodiol (ACTIGALL) 500 MG tablet Take 500 mg by mouth 3 (three) times daily.   No facility-administered encounter medications on file as of 08/13/2020.    Patient Active Problem List   Diagnosis Date Noted  . Chronic kidney disease (CKD) 05/25/2020  . Hypoalbuminemia 05/25/2020  . GERD (gastroesophageal reflux disease) 05/25/2020  . Pressure injury of skin 05/17/2020  . Obesity, Class III, BMI 40-49.9 (morbid obesity) ([place]) 03/07/2020  . Common bile duct (CBD) obstruction 05/28/2019  . Benign neoplasm of ascending colon   . Benign neoplasm of transverse colon   . Benign neoplasm of descending colon   . Benign neoplasm of sigmoid colon   . Gastric polyps   . Prolonged QT interval 03/11/2019  . Onychomycosis 06/21/2018  . Osteomyelitis of second toe of right foot ([place])   . 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 ([place])   . PVD (peripheral vascular disease) ([place]) 10/26/2017  . Hypothyroidism 07/27/2017  . PAH (pulmonary artery hypertension) ([place])   . Impaired ambulation 07/19/2017  . Leg cramps 02/27/2017  . Peripheral edema 01/12/2017  . Diabetic neuropathy ([place]) 11/12/2016  . 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 mellitus ([place]) 10/24/2015  . Anemia 10/03/2015  . Generalized anxiety disorder 10/03/2015  . Insomnia 10/03/2015  . Hyperglycemia due to diabetes mellitus ([place]) 06/07/2015  . Chronic diastolic CHF (congestive heart failure) ([place]) 06/07/2015  . Non compliance with medical treatment 04/17/2014  . Rotator cuff tear 03/14/2014  . Class 3 obesity 09/23/2013  . Hypotension 12/25/2012  . Urinary incontinence   . MDD (major depressive disorder) 11/12/2010  . RBBB (right bundle branch  block)   . CAD (coronary artery disease)   . Hyperlipemia 01/22/2009  . Essential hypertension 01/22/2009    Past Medical History:  Diagnosis Date  . Acute MI ([place]) 1999; 2007  . Anemia    hx  . Anginal pain ([place])   . Anxiety   . ARF (acute renal failure) ([place]) 06/2017   [facility]  . Arthritis    "generalized" (03/15/2014)  . CAD (coronary artery disease)    MI in 2000 - MI  2007 - treated bare metal stent (no nuclear since then as 9/11)  . Carotid artery disease ([place])   . CHF (congestive heart failure) ([place])   . Chronic diastolic heart failure (HCC)    a) ECHO (08/2013) EF 55-60% and RV function nl b) RHC (08/2013) RA 4, RV 30/5/7, PA 25/10 (16), PCWP 7, Fick CO/CI 6.3/2.7, PVR 1.5 WU, PA 61 and 66%  . Daily headache    "~ every other day; since I fell in June" (03/15/2014)  . Depression   .  Dyslipidemia   . Dyspnea   . Exertional shortness of breath   . HTN (hypertension)   . Hypothyroidism   . Neuropathy   . Obesity   . Osteoarthritis   . Peripheral neuropathy   . PONV (postoperative nausea and vomiting)   . RBBB (right bundle branch block)    Old  . Stroke [facility])    mini strokes  . Syncope    likely due to low blood sugar  . Tachycardia    Sinus tachycardia  . Type II diabetes mellitus (HCC)    Type II  . Urinary incontinence   . Venous insufficiency     Relevant past medical, surgical, family and social history reviewed and updated as indicated. Interim medical history since our last visit reviewed.  Review of Systems  Constitutional: Positive for appetite change (decreased), fatigue and unexpected weight change. Negative for diaphoresis.  Eyes: Negative.   Respiratory: Negative for cough, chest tightness, shortness of breath and wheezing.   Cardiovascular: Positive for leg swelling. Negative for chest pain and palpitations.  Gastrointestinal: Negative.   Genitourinary: Positive for decreased urine volume and difficulty urinating. Negative for  dysuria, enuresis, frequency, hematuria, pelvic pain, urgency and vaginal discharge.  Skin: Negative.  Negative for color change and rash.  Neurological: Negative.   Psychiatric/Behavioral: Negative.    Per HPI unless specifically indicated above    Objective:    BP 132/82   Pulse 76   Temp (!) 97.3 F (36.3 C)   Ht 5\' 6"  (1.676 m)   Wt 274 lb (124.3 kg)   SpO2 92%   BMI 44.22 kg/m   Wt Readings from Last 3 Encounters:  08/13/20 274 lb (124.3 kg)  08/07/20 264 lb (119.7 kg)  07/31/20 260 lb 6.4 oz (118.1 kg)    Physical Exam Vitals and nursing note reviewed.  Constitutional:      General: She is not in acute distress.    Appearance: Normal appearance. She is obese. She is not toxic-appearing.  HENT:     Head: Normocephalic and atraumatic.  Eyes:     General: No scleral icterus.    Extraocular Movements: Extraocular movements intact.  Cardiovascular:     Rate and Rhythm: Normal rate.     Heart sounds: Normal heart sounds.  Pulmonary:     Effort: Pulmonary effort is normal. No respiratory distress.     Breath sounds: Normal breath sounds. No rhonchi or rales.  Abdominal:     General: Abdomen is flat. Bowel sounds are normal. There is no distension.     Palpations: Abdomen is soft. There is no mass.     Tenderness: There is no right CVA tenderness or left CVA tenderness.  Musculoskeletal:     Right lower leg: Edema present.     Left lower leg: Edema present.  Skin:    General: Skin is warm and dry.     Capillary Refill: Capillary refill takes less than 2 seconds.     Coloration: Skin is not jaundiced or pale.     Findings: No erythema.  Neurological:     Mental Status: She is alert and oriented to person, place, and time.     Gait: Gait abnormal (walks with cane - baseline).  Psychiatric:        Mood and Affect: Mood normal.        Behavior: Behavior normal.        Thought Content: Thought content normal.        Judgment: Judgment  normal.       Assessment &  Plan:   Problem List Items Addressed This Visit      Cardiovascular and Mediastinum   Chronic diastolic CHF (congestive heart failure) (HCC) (Chronic)    Chronic.  Weight is up 10 lbs from last visit within the system ~1 week ago.  Given dark urine color, concern for acute kidney injury.  Appears to be euvolemic on examination despite weight increase.  STAT labs checked including CMP, BNP, and CBC.  Will need to schedule close follow up likely with CHF clinic/nephrologist.      Relevant Orders   CBC with Differential   Brain natriuretic peptide     Genitourinary   Chronic kidney disease (CKD)    Chronic.  Last saw Nephrology in Nov. 2021.  Given decreased urine production, decreased appetite, dark urine, and up about 10 lbs in 1 week, concern for acute kidney injury on chronic kidney failure.  Overdue for labs with heart failure clinic as well.  Will check STAT BNP, CMP, and CBC.  Likely, will need to decrease Torsemide and have close follow up with CHF clinic and/or Nephrologist.      Relevant Orders   COMPLETE METABOLIC PANEL WITH GFR   CBC with Differential    Other Visit Diagnoses    Dark brown-colored urine    -  Primary   Unable to given urine sample today; likely related to dehydration.  STAT CMP, BNP, and CBC checked.   Relevant Orders   COMPLETE METABOLIC PANEL WITH GFR   CBC with Differential   Brain natriuretic peptide       Follow up plan: Return for pending blood work.

## 2020-08-14 ENCOUNTER — Other Ambulatory Visit (HOSPITAL_COMMUNITY): Payer: Self-pay | Admitting: Adult Health

## 2020-08-14 ENCOUNTER — Inpatient Hospital Stay (HOSPITAL_COMMUNITY): Payer: HMO

## 2020-08-14 DIAGNOSIS — I428 Other cardiomyopathies: Secondary | ICD-10-CM | POA: Diagnosis present

## 2020-08-14 DIAGNOSIS — E1140 Type 2 diabetes mellitus with diabetic neuropathy, unspecified: Secondary | ICD-10-CM | POA: Diagnosis present

## 2020-08-14 DIAGNOSIS — I2510 Atherosclerotic heart disease of native coronary artery without angina pectoris: Secondary | ICD-10-CM

## 2020-08-14 DIAGNOSIS — E039 Hypothyroidism, unspecified: Secondary | ICD-10-CM

## 2020-08-14 DIAGNOSIS — I130 Hypertensive heart and chronic kidney disease with heart failure and stage 1 through stage 4 chronic kidney disease, or unspecified chronic kidney disease: Secondary | ICD-10-CM | POA: Diagnosis present

## 2020-08-14 DIAGNOSIS — I25119 Atherosclerotic heart disease of native coronary artery with unspecified angina pectoris: Secondary | ICD-10-CM | POA: Diagnosis present

## 2020-08-14 DIAGNOSIS — N179 Acute kidney failure, unspecified: Secondary | ICD-10-CM

## 2020-08-14 DIAGNOSIS — E1151 Type 2 diabetes mellitus with diabetic peripheral angiopathy without gangrene: Secondary | ICD-10-CM | POA: Diagnosis present

## 2020-08-14 DIAGNOSIS — Z20822 Contact with and (suspected) exposure to covid-19: Secondary | ICD-10-CM | POA: Diagnosis present

## 2020-08-14 DIAGNOSIS — L89323 Pressure ulcer of left buttock, stage 3: Secondary | ICD-10-CM | POA: Diagnosis present

## 2020-08-14 DIAGNOSIS — E871 Hypo-osmolality and hyponatremia: Secondary | ICD-10-CM | POA: Diagnosis present

## 2020-08-14 DIAGNOSIS — N184 Chronic kidney disease, stage 4 (severe): Secondary | ICD-10-CM | POA: Diagnosis present

## 2020-08-14 DIAGNOSIS — I160 Hypertensive urgency: Secondary | ICD-10-CM | POA: Diagnosis present

## 2020-08-14 DIAGNOSIS — G4733 Obstructive sleep apnea (adult) (pediatric): Secondary | ICD-10-CM | POA: Diagnosis present

## 2020-08-14 DIAGNOSIS — E11649 Type 2 diabetes mellitus with hypoglycemia without coma: Secondary | ICD-10-CM | POA: Diagnosis not present

## 2020-08-14 DIAGNOSIS — I5033 Acute on chronic diastolic (congestive) heart failure: Secondary | ICD-10-CM | POA: Diagnosis present

## 2020-08-14 DIAGNOSIS — N390 Urinary tract infection, site not specified: Secondary | ICD-10-CM | POA: Diagnosis present

## 2020-08-14 DIAGNOSIS — I10 Essential (primary) hypertension: Secondary | ICD-10-CM

## 2020-08-14 DIAGNOSIS — Z6841 Body Mass Index (BMI) 40.0 and over, adult: Secondary | ICD-10-CM | POA: Diagnosis not present

## 2020-08-14 DIAGNOSIS — E785 Hyperlipidemia, unspecified: Secondary | ICD-10-CM | POA: Diagnosis present

## 2020-08-14 DIAGNOSIS — D631 Anemia in chronic kidney disease: Secondary | ICD-10-CM | POA: Diagnosis present

## 2020-08-14 DIAGNOSIS — E1122 Type 2 diabetes mellitus with diabetic chronic kidney disease: Secondary | ICD-10-CM | POA: Diagnosis present

## 2020-08-14 DIAGNOSIS — E872 Acidosis: Secondary | ICD-10-CM | POA: Diagnosis present

## 2020-08-14 DIAGNOSIS — E1165 Type 2 diabetes mellitus with hyperglycemia: Secondary | ICD-10-CM

## 2020-08-14 DIAGNOSIS — L89313 Pressure ulcer of right buttock, stage 3: Secondary | ICD-10-CM | POA: Diagnosis present

## 2020-08-14 DIAGNOSIS — R7989 Other specified abnormal findings of blood chemistry: Secondary | ICD-10-CM | POA: Diagnosis present

## 2020-08-14 DIAGNOSIS — E875 Hyperkalemia: Secondary | ICD-10-CM | POA: Diagnosis present

## 2020-08-14 LAB — BASIC METABOLIC PANEL
Anion gap: 12 (ref 5–15)
BUN: 101 mg/dL — ABNORMAL HIGH (ref 8–23)
CO2: 18 mmol/L — ABNORMAL LOW (ref 22–32)
Calcium: 8.9 mg/dL (ref 8.9–10.3)
Chloride: 101 mmol/L (ref 98–111)
Creatinine, Ser: 3.29 mg/dL — ABNORMAL HIGH (ref 0.44–1.00)
GFR, Estimated: 14 mL/min — ABNORMAL LOW (ref >60)
Glucose, Bld: 402 mg/dL — ABNORMAL HIGH (ref 70–99)
Potassium: 4.4 mmol/L (ref 3.5–5.1)
Sodium: 131 mmol/L — ABNORMAL LOW (ref 135–145)

## 2020-08-14 LAB — URINALYSIS, ROUTINE W REFLEX MICROSCOPIC
Bilirubin Urine: NEGATIVE
Glucose, UA: 150 mg/dL — AB
Hgb urine dipstick: NEGATIVE
Ketones, ur: NEGATIVE mg/dL
Nitrite: NEGATIVE
Protein, ur: 30 mg/dL — AB
Specific Gravity, Urine: 1.01 (ref 1.005–1.030)
WBC, UA: >50 WBC/hpf — ABNORMAL HIGH (ref 0–5)
pH: 5 (ref 5.0–8.0)

## 2020-08-14 LAB — CBC
HCT: 31.5 % — ABNORMAL LOW (ref 36.0–46.0)
Hemoglobin: 9.6 g/dL — ABNORMAL LOW (ref 12.0–15.0)
MCH: 26.3 pg (ref 26.0–34.0)
MCHC: 30.5 g/dL (ref 30.0–36.0)
MCV: 86.3 fL (ref 80.0–100.0)
Platelets: 174 10*3/uL (ref 150–400)
RBC: 3.65 MIL/uL — ABNORMAL LOW (ref 3.87–5.11)
RDW: 18.4 % — ABNORMAL HIGH (ref 11.5–15.5)
WBC: 5.5 10*3/uL (ref 4.0–10.5)
nRBC: 0 % (ref 0.0–0.2)

## 2020-08-14 LAB — HEMOGLOBIN A1C
Hgb A1c MFr Bld: 11 % — ABNORMAL HIGH (ref 4.8–5.6)
Mean Plasma Glucose: 269 mg/dL

## 2020-08-14 LAB — CBG MONITORING, ED
Glucose-Capillary: 400 mg/dL — ABNORMAL HIGH (ref 70–99)
Glucose-Capillary: 379 mg/dL — ABNORMAL HIGH (ref 70–99)
Glucose-Capillary: 277 mg/dL — ABNORMAL HIGH (ref 70–99)
Glucose-Capillary: 212 mg/dL — ABNORMAL HIGH (ref 70–99)

## 2020-08-14 LAB — SODIUM, URINE, RANDOM: Sodium, Ur: 68 mmol/L

## 2020-08-14 LAB — GLUCOSE, CAPILLARY: Glucose-Capillary: 111 mg/dL — ABNORMAL HIGH (ref 70–99)

## 2020-08-14 LAB — SARS CORONAVIRUS 2 (TAT 6-24 HRS): SARS Coronavirus 2: NEGATIVE

## 2020-08-14 LAB — PROCALCITONIN: Procalcitonin: 6.49 ng/mL

## 2020-08-14 LAB — CREATININE, URINE, RANDOM: Creatinine, Urine: 35.12 mg/dL

## 2020-08-14 MED ORDER — ALBUTEROL SULFATE (2.5 MG/3ML) 0.083% IN NEBU: 2.5000 mg | INHALATION_SOLUTION | Freq: Four times a day (QID) | RESPIRATORY_TRACT | Status: DC | PRN

## 2020-08-14 MED ORDER — CLOPIDOGREL BISULFATE 75 MG PO TABS
75.0000 mg | ORAL_TABLET | Freq: Every day | ORAL | Status: DC
  Administered 2020-08-14 – 2020-08-19 (×6): 75 mg via ORAL
  Filled 2020-08-14 (×6): qty 1

## 2020-08-14 MED ORDER — INSULIN ASPART 100 UNIT/ML ~~LOC~~ SOLN
0.0000 [IU] | Freq: Three times a day (TID) | SUBCUTANEOUS | Status: DC
  Administered 2020-08-14: 2 [IU] via SUBCUTANEOUS
  Administered 2020-08-14 – 2020-08-15 (×3): 5 [IU] via SUBCUTANEOUS
  Administered 2020-08-15: 2 [IU] via SUBCUTANEOUS
  Administered 2020-08-15 – 2020-08-16 (×3): 3 [IU] via SUBCUTANEOUS
  Administered 2020-08-16: 2 [IU] via SUBCUTANEOUS
  Administered 2020-08-17: 1 [IU] via SUBCUTANEOUS
  Administered 2020-08-17 (×2): 2 [IU] via SUBCUTANEOUS
  Administered 2020-08-18: 4 [IU] via SUBCUTANEOUS
  Administered 2020-08-18: 3 [IU] via SUBCUTANEOUS
  Administered 2020-08-18 – 2020-08-19 (×3): 2 [IU] via SUBCUTANEOUS

## 2020-08-14 MED ORDER — ALLOPURINOL 100 MG PO TABS
100.0000 mg | ORAL_TABLET | Freq: Two times a day (BID) | ORAL | Status: DC
  Administered 2020-08-14 – 2020-08-19 (×11): 100 mg via ORAL
  Filled 2020-08-14 (×13): qty 1

## 2020-08-14 MED ORDER — FERROUS SULFATE 325 (65 FE) MG PO TABS
325.0000 mg | ORAL_TABLET | Freq: Two times a day (BID) | ORAL | Status: DC
  Administered 2020-08-14 – 2020-08-19 (×11): 325 mg via ORAL
  Filled 2020-08-14 (×11): qty 1

## 2020-08-14 MED ORDER — INSULIN ASPART 100 UNIT/ML ~~LOC~~ SOLN
0.0000 [IU] | Freq: Every day | SUBCUTANEOUS | Status: DC
  Administered 2020-08-15: 5 [IU] via SUBCUTANEOUS
  Administered 2020-08-16: 4 [IU] via SUBCUTANEOUS

## 2020-08-14 MED ORDER — NITROGLYCERIN 0.4 MG SL SUBL: 0.4000 mg | SUBLINGUAL_TABLET | SUBLINGUAL | Status: DC | PRN

## 2020-08-14 MED ORDER — HEPARIN SODIUM (PORCINE) 5000 UNIT/ML IJ SOLN
5000.0000 [IU] | Freq: Three times a day (TID) | INTRAMUSCULAR | Status: DC
  Administered 2020-08-14 – 2020-08-19 (×16): 5000 [IU] via SUBCUTANEOUS
  Filled 2020-08-14 (×15): qty 1

## 2020-08-14 MED ORDER — LEVOTHYROXINE SODIUM 50 MCG PO TABS
50.0000 ug | ORAL_TABLET | Freq: Every day | ORAL | Status: DC
  Administered 2020-08-14 – 2020-08-19 (×6): 50 ug via ORAL
  Filled 2020-08-14 (×6): qty 1

## 2020-08-14 MED ORDER — FLUOXETINE HCL 10 MG PO CAPS
10.0000 mg | ORAL_CAPSULE | Freq: Every day | ORAL | Status: DC
  Administered 2020-08-14 – 2020-08-19 (×6): 10 mg via ORAL
  Filled 2020-08-14 (×7): qty 1

## 2020-08-14 MED ORDER — PREGABALIN 75 MG PO CAPS
150.0000 mg | ORAL_CAPSULE | Freq: Two times a day (BID) | ORAL | Status: DC
  Administered 2020-08-14 – 2020-08-15 (×4): 150 mg via ORAL
  Filled 2020-08-14 (×5): qty 2

## 2020-08-14 MED ORDER — MORPHINE SULFATE (PF) 2 MG/ML IV SOLN
2.0000 mg | INTRAVENOUS | Status: DC | PRN
Start: 2020-08-14 — End: 2020-08-19

## 2020-08-14 MED ORDER — INSULIN ASPART 100 UNIT/ML ~~LOC~~ SOLN
6.0000 [IU] | Freq: Three times a day (TID) | SUBCUTANEOUS | Status: DC
  Administered 2020-08-14 – 2020-08-15 (×3): 6 [IU] via SUBCUTANEOUS

## 2020-08-14 MED ORDER — ASPIRIN EC 81 MG PO TBEC
81.0000 mg | DELAYED_RELEASE_TABLET | Freq: Every day | ORAL | Status: DC
  Administered 2020-08-14 – 2020-08-19 (×6): 81 mg via ORAL
  Filled 2020-08-14 (×6): qty 1

## 2020-08-14 MED ORDER — TRAZODONE HCL 100 MG PO TABS
200.0000 mg | ORAL_TABLET | Freq: Every day | ORAL | Status: DC
  Administered 2020-08-14 – 2020-08-15 (×2): 200 mg via ORAL
  Filled 2020-08-14 (×2): qty 2

## 2020-08-14 MED ORDER — CARVEDILOL 12.5 MG PO TABS
12.5000 mg | ORAL_TABLET | Freq: Two times a day (BID) | ORAL | Status: DC
  Administered 2020-08-14 – 2020-08-19 (×11): 12.5 mg via ORAL
  Filled 2020-08-14 (×11): qty 1

## 2020-08-14 MED ORDER — ALBUTEROL SULFATE HFA 108 (90 BASE) MCG/ACT IN AERS: 2.0000 | INHALATION_SPRAY | Freq: Four times a day (QID) | RESPIRATORY_TRACT | Status: DC | PRN

## 2020-08-14 MED ORDER — ACETAMINOPHEN 650 MG RE SUPP: 650.0000 mg | Freq: Four times a day (QID) | RECTAL | Status: DC | PRN

## 2020-08-14 MED ORDER — ACETAMINOPHEN 325 MG PO TABS: 650.0000 mg | ORAL_TABLET | Freq: Four times a day (QID) | ORAL | Status: DC | PRN

## 2020-08-14 MED ORDER — FUROSEMIDE 10 MG/ML IJ SOLN
80.0000 mg | Freq: Once | INTRAMUSCULAR | Status: AC
  Administered 2020-08-14: 80 mg via INTRAVENOUS
  Filled 2020-08-14: qty 8

## 2020-08-14 MED ORDER — HYDRALAZINE HCL 20 MG/ML IJ SOLN: 5.0000 mg | INTRAMUSCULAR | Status: DC | PRN

## 2020-08-14 MED ORDER — INSULIN DETEMIR 100 UNIT/ML ~~LOC~~ SOLN
40.0000 [IU] | Freq: Every day | SUBCUTANEOUS | Status: DC
  Administered 2020-08-14: 40 [IU] via SUBCUTANEOUS
  Filled 2020-08-14 (×2): qty 0.4

## 2020-08-14 MED ORDER — INSULIN NPH (HUMAN) (ISOPHANE) 100 UNIT/ML ~~LOC~~ SUSP
15.0000 [IU] | Freq: Every day | SUBCUTANEOUS | Status: DC | PRN
  Administered 2020-08-14: 15 [IU] via SUBCUTANEOUS
  Filled 2020-08-14: qty 10

## 2020-08-14 NOTE — Consult Note (Addendum)
Advanced Heart Failure Team Consult Note   Primary Physician: [name] T Primary Cardiologist:  Beverley Fiedler  Reason for Consultation: AKI on CKD 4, Diastolic CHF  HPI:    Susan [name] is seen today for evaluation of AKI on CKD 4, Diastolic CHF at the request of Dr. Marlowe Sax.   Susan [name] is a 70 y.o. female with a history of RBBB, DM, Hypothyroidish, diastolic heart failure, CAD MI 2000/2007 BMS 2007, TIA, obesity, CKD Stage IV and Rt transmetatarsal amputation.   Admitted June 1st through June 3rd, 2017 with altered mental status, hypothermia, and weakness. Diuretics initially held and later restarted.   Admitted 2/27-07/28/17 for AKI. Torsemide was held and restarted at 60 mg BID. She had RHC 07/24/17 that showed mild PAH with evidence of RV strain likely due to OHS/OSA. She was discharged to SNF.   Admitted 07/2200 with A/C diastolic HF. Diuresed with IV lasix.   Admitted 10/24-10/27/19 with syncope. She was orthostatic. Had AKI with creatinine up to 3.33 and required IVF. Renal US showed no hydronephrosis. Losartan and Imdur were DC'd. Creatinine 1.76 on day of DC.  Was admitted 1/21 for confusion and abdominal pain. Found to have Klebsiella pneumoniae UTI treated w/ abx. Also with elevated LFTs. CT of abdomen showed cholelithiasis. MRCP showed cholelithiasis without evidence of cholecystitis or choledocholithiasis. Abdominal US with doppler showed no acute finding.No concern for cholangitis. All viral serologies negative. Her Crestor was discontinued. She had outpatient GI f/u and continued work-up including a liver biopsy 08/11/19. No definite pathologic fibrosis noted in pathology report.    Admitted to [facility] 05/24/20 with and A/C diastolic heart failure. Diuresed with IV lasix and transitioned to torsemide 80 mg twice a day.   She returned for HF follow up 1/22. Stressed out about taking care of her disabled son. SOB with exertion. + Orthopnea. Eating fast food.  Not weighing at home. Taking all medications. Lives with her husband and disabled son. She was instructed to increase torsemide to 100 mg bid x 2 days, then back down to 80 mg bid.  Had return f/u again on 2/11 and was still taking 100 of torsemide bid and had not reduced back down to 80 bid as previously directed. Labs showed AKI, w/ SCr rising from baseline of 1.9>>2.4. Was advised to reduce torsemide to 80 mg once daily x 3 days, then return to 80 mg bid.   Had home sleep study 07/25/20 and this was negative for OSA.   Last visit in AHF clinic 07/31/20 she was felt to still be mildly volume overloaded but with worsening cr so diuretics were not increased.  Her weight at that time was 260lbs.  At PCP office 3/21 weight up to 274lbs.  She admits to continued poor dietary compliance since then and drinking large amounts of fluid.  Reports her weight had been increasing around a pound per day since then.    She tells me she had severe nausea and vomiting starting Friday and lasting into Saturday. Vomited more times than she can count. She continued her diuretics during this time.  She noticed that her urine started to turn brown and her urine output dropped significantly after this.  She began to feel worsening shortness of breath.    Review of Systems: [y] = yes, [ ]  = no   General: Weight gain Blue.Reese ]; Weight loss [ ] ; Anorexia [ ] ; Fatigue Blue.Reese ]; Fever [ ] ; Chills [ ] ; Weakness Blue.Reese ]  Cardiac: Chest  pain/pressure [ ] ; Resting SOB Blue.Reese ]; Exertional SOB Blue.Reese ]; Orthopnea Blue.Reese ]; Pedal Edema Blue.Reese ]; Palpitations [ ] ; Syncope [ ] ; Presyncope [ ] ; Paroxysmal nocturnal dyspnea[ ]   Pulmonary: Cough [ ] ; Wheezing[ ] ; Hemoptysis[ ] ; Sputum [ ] ; Snoring [ ]   GI: Vomiting[ ] ; Dysphagia[ ] ; Melena[ ] ; Hematochezia [ ] ; Heartburn[ ] ; Abdominal pain [ ] ; Constipation [ ] ; Diarrhea [ ] ; BRBPR [ ]   GU: Hematuria[ ] ; Dysuria [ ] ; Nocturia[ ]   Vascular: Pain in legs with walking [ ] ; Pain in feet with lying flat [ ] ;  Non-healing sores [ ] ; Stroke [ ] ; TIA [ ] ; Slurred speech [ ] ;  Neuro: Headaches[ ] ; Vertigo[ ] ; Seizures[ ] ; Paresthesias[ ] ;Blurred vision [ ] ; Diplopia [ ] ; Vision changes [ ]   Ortho/Skin: Arthritis [ y]; Joint pain Blue.Reese ]; Muscle pain [ ] ; Joint swelling [ ] ; Back Pain [ ] ; Rash [ ]   Psych: Depression[ ] ; Anxiety[ ]   Heme: Bleeding problems [ ] ; Clotting disorders [ ] ; Anemia [ ]   Endocrine: Diabetes Blue.Reese ]; Thyroid dysfunction[ ]   Home Medications Prior to Admission medications   Medication Sig Start Date End Date Taking? Authorizing Provider  acetaminophen (TYLENOL) 500 MG tablet Take 1,000 mg by mouth every 6 (six) hours as needed for moderate pain or headache.   Yes [provider]  albuterol (VENTOLIN HFA) 108 (90 Base) MCG/ACT inhaler TAKE 2 PUFFS BY MOUTH EVERY 6 HOURS AS NEEDED FOR WHEEZE OR SHORTNESS OF BREATH Patient taking differently: Inhale 2 puffs into the lungs every 6 (six) hours as needed for wheezing or shortness of breath. 06/27/19  Yes [place], [name], MD  allopurinol (ZYLOPRIM) 100 MG tablet TAKE 1 TABLET BY MOUTH TWICE A DAY Patient taking differently: Take 100 mg by mouth 2 (two) times daily. 08/02/19  Yes Newt Minion, MD  Ascorbic Acid (VITAMIN C) 1000 MG tablet Take 1,000 mg by mouth daily.   Yes [provider]  aspirin EC 81 MG tablet Take 1 tablet (81 mg total) by mouth daily with breakfast. 05/28/20  Yes Emokpae, Courage, MD  Bismuth Tribromoph-Petrolatum (XEROFORM PETROLAT GAUZE 5"X9") MISC Apply 1 each topically 2 (two) times daily. For dressing changes 05/28/20  Yes Emokpae, Courage, MD  carvedilol (COREG) 12.5 MG tablet TAKE 1 TABLET (12.5 MG TOTAL) BY MOUTH 2 (TWO) TIMES DAILY WITH A MEAL. 08/13/20  Yes [name], MD  cholecalciferol (VITAMIN D) 1000 units tablet Take 1,000 Units by mouth daily with supper.    Yes [provider]  clopidogrel (PLAVIX) 75 MG tablet Take 1 tablet (75 mg total) by mouth daily with breakfast. 05/23/20   Yes Clegg, Amy D, NP  ferrous sulfate 325 (65 FE) MG tablet Take 1 tablet (325 mg total) by mouth 2 (two) times daily with a meal. Reported on 08/21/2015 06/07/18  Yes [place], [name], MD  FLUoxetine (PROZAC) 20 MG capsule Take 10 mg by mouth daily.   Yes [provider]  guaiFENesin-dextromethorphan (ROBITUSSIN DM) 100-10 MG/5ML syrup Take 10 mLs by mouth every 4 (four) hours as needed for cough. 03/13/20  Yes [place] Eric J, DO  Insulin Disposable Pump (OMNIPOD DASH 5 PACK PODS) MISC Inject into the skin See admin instructions. Use continuously with Novolin R - change every 72 hours. 11/18/18  Yes [provider]  insulin NPH Human (HUMULIN N,NOVOLIN N) 100 UNIT/ML injection Inject 15 Units into the skin See admin instructions. Inject 15 units subcutaneously in the morning if CBG >300;  Yes [provider]  insulin regular (NOVOLIN R) 100 units/mL injection Inject into the skin See admin instructions. Manually add bolus to continuous dose via OmniPod 3 times daily per sliding scale (CBG 80-150 7 units, 151-200 9 units, 201-250 12 units, 251-300 14 units, 301- 400 17 units)   Yes [provider]  levothyroxine (SYNTHROID, LEVOTHROID) 50 MCG tablet Take 1 tablet (50 mcg total) by mouth daily before breakfast. 11/07/16  Yes Dixon, Mary B, PA-C  magnesium oxide (MAG-OX) 400 MG tablet TAKE 1 TABLET BY MOUTH EVERY DAY Patient taking differently: Take 400 mg by mouth daily. 07/23/20  Yes [place], [name], MD  Multiple Vitamins-Minerals (AIRBORNE GUMMIES PO) Take 2 tablets by mouth every evening.   Yes [provider]  Multiple Vitamins-Minerals (CENTRUM SILVER 50+WOMEN) TABS Take 1 tablet by mouth every evening.   Yes [provider]  Multiple Vitamins-Minerals (ZINC PO) Take 1 tablet by mouth every evening.   Yes [provider]  nitroGLYCERIN (NITROSTAT) 0.4 MG SL tablet Place 1 tablet (0.4 mg total) under the tongue every 5 (five) minutes as  needed for chest pain. 04/28/19  Yes Clegg, Amy D, NP  ondansetron (ZOFRAN) 4 MG tablet Take 1 tablet (4 mg total) by mouth every 8 (eight) hours as needed for nausea or vomiting. 05/28/20 05/28/21 Yes Roxan Hockey, MD  [facility] VERIO test strip 3 (three) times daily. 07/25/20  Yes [provider]  potassium chloride SA (KLOR-CON) 20 MEQ tablet Take 20 mEq by mouth daily.   Yes [provider]  pregabalin (LYRICA) 150 MG capsule Take 1 capsule (150 mg total) by mouth 2 (two) times daily. 11/12/16  Yes [place], [name], MD  torsemide (DEMADEX) 20 MG tablet Take 80-100 mg by mouth See admin instructions. 100 mg in the AM and 80 mg in the PM   Yes [provider]  traZODone (DESYREL) 100 MG tablet TAKE 1 AND 1/2 TABLETS BY MOUTH AT BEDTIME AS NEEDED FOR SLEEP. Patient taking differently: Take 200 mg by mouth at bedtime. 01/26/20  Yes [place], [name], MD  ursodiol (ACTIGALL) 500 MG tablet Take 500 mg by mouth 3 (three) times daily. 09/09/19  Yes [provider]  buPROPion (WELLBUTRIN XL) 150 MG 24 hr tablet Take 1 tablet (150 mg total) by mouth daily. For depression Patient not taking: Reported on 08/14/2020 06/30/20   Alycia Rossetti, MD    Past Medical History: Past Medical History:  Diagnosis Date  . Acute MI ([place]) 1999; 2007  . Anemia    hx  . Anginal pain ([place])   . Anxiety   . ARF (acute renal failure) ([place]) 06/2017   [facility]  . Arthritis    "generalized" (03/15/2014)  . CAD (coronary artery disease)    MI in 2000 - MI  2007 - treated bare metal stent (no nuclear since then as 9/11)  . Carotid artery disease ([place])   . CHF (congestive heart failure) ([place])   . Chronic diastolic heart failure (HCC)    a) ECHO (08/2013) EF 55-60% and RV function nl b) RHC (08/2013) RA 4, RV 30/5/7, PA 25/10 (16), PCWP 7, Fick CO/CI 6.3/2.7, PVR 1.5 WU, PA 61 and 66%  . Daily headache    "~ every other day; since I fell in June" (03/15/2014)  . Depression   .  Dyslipidemia   . Dyspnea   . Exertional shortness of breath   . HTN (hypertension)   . Hypothyroidism   .  Neuropathy   . Obesity   . Osteoarthritis   . Peripheral neuropathy   . PONV (postoperative nausea and vomiting)   . RBBB (right bundle branch block)    Old  . Stroke [facility])    mini strokes  . Syncope    likely due to low blood sugar  . Tachycardia    Sinus tachycardia  . Type II diabetes mellitus (HCC)    Type II  . Urinary incontinence   . Venous insufficiency     Past Surgical History: Past Surgical History:  Procedure Laterality Date  . ABDOMINAL HYSTERECTOMY  1980's  . AMPUTATION Right 02/24/2018   Procedure: RIGHT FOOT GREAT TOE AND 2ND TOE AMPUTATION;  Surgeon: Newt Minion, MD;  Location: [place];  Service: Orthopedics;  Laterality: Right;  . AMPUTATION Right 04/30/2018   Procedure: RIGHT TRANSMETATARSAL AMPUTATION;  Surgeon: Newt Minion, MD;  Location: [place];  Service: Orthopedics;  Laterality: Right;  . BIOPSY  05/27/2020   Procedure: BIOPSY;  Surgeon: [name], DO;  [facility];  Service: Endoscopy;;  . CATARACT EXTRACTION, BILATERAL Bilateral ?2013  . COLONOSCOPY W/ POLYPECTOMY    . COLONOSCOPY WITH PROPOFOL N/A 03/13/2019   Procedure: COLONOSCOPY WITH PROPOFOL;  Surgeon: Jerene Bears, MD;  Location: [place];  Service: Gastroenterology;  Laterality: N/A;  . [place]; 2007   "1 + 1"  . ESOPHAGOGASTRODUODENOSCOPY (EGD) WITH PROPOFOL N/A 03/13/2019   Procedure: ESOPHAGOGASTRODUODENOSCOPY (EGD) WITH PROPOFOL;  Surgeon: Jerene Bears, MD;  Location: [facility] ENDOSCOPY;  Service: Gastroenterology;  Laterality: N/A;  . ESOPHAGOGASTRODUODENOSCOPY (EGD) WITH PROPOFOL N/A 05/27/2020   Procedure: ESOPHAGOGASTRODUODENOSCOPY (EGD) WITH PROPOFOL;  Surgeon: [name], DO;  [facility];  Service: Endoscopy;  Laterality: N/A;  . EYE SURGERY Bilateral    l[place]  . HEMOSTASIS CLIP PLACEMENT   03/13/2019   Procedure: HEMOSTASIS CLIP PLACEMENT;  Surgeon: Jerene Bears, MD;  Location: [facility] ENDOSCOPY;  Service: Gastroenterology;;  . KNEE ARTHROSCOPY Left 10/25/2006  . POLYPECTOMY  03/13/2019   Procedure: POLYPECTOMY;  Surgeon: Jerene Bears, MD;  Location: [place];  Service: Gastroenterology;;  . RIGHT HEART CATH N/A 07/24/2017   Procedure: RIGHT HEART CATH;  Surgeon: Jolaine Artist, MD;  Location: [place] [facility] LAB;  Service: Cardiovascular;  Laterality: N/A;  . RIGHT HEART CATHETERIZATION N/A 09/22/2013   Procedure: RIGHT HEART CATH;  Surgeon: Jolaine Artist, MD;  Location: [facility] CATH LAB;  Service: Cardiovascular;  Laterality: N/A;  . SHOULDER ARTHROSCOPY WITH OPEN ROTATOR CUFF REPAIR Right 03/14/2014   Procedure: RIGHT SHOULDER ARTHROSCOPY WITH BICEPS RELEASE, OPEN SUBSCAPULA REPAIR, OPEN SUPRASPINATUS REPAIR.;  Surgeon: Meredith Pel, MD;  Location: [place];  Service: Orthopedics;  Laterality: Right;  . TOE AMPUTATION Right 02/24/2018   GREAT TOE AND 2ND TOE AMPUTATION  . TUBAL LIGATION  1970's    Family History: Family History  Problem Relation Age of Onset  . Heart attack Mother 33    Social History: Social History   Socioeconomic History  . Marital status: Married    Spouse name: Not on file  . Number of children: 3  . Years of education: 12th  . Highest education level: Not on file  Occupational History    Employer: UNEMPLOYED  Tobacco Use  . Smoking status: Former Smoker    Packs/day: 3.00    Years: 32.00    Pack years: 96.00    Types: Cigarettes    Quit date:  10/24/1997    Years since quitting: 22.8  . Smokeless tobacco: Never Used  Vaping Use  . Vaping Use: Never used  Substance and Sexual Activity  . Alcohol use: Not Currently    Comment: "might have 2-3 daiquiris in the summer"  . Drug use: No  . Sexual activity: Never  Other Topics Concern  . Not on file  Social History Narrative   Pt lives at home with her spouse.   Caffeine Use- 3  sodas daily   Social Determinants of Health   Financial Resource Strain: Low Risk   . Difficulty of Paying Living Expenses: Not very hard  Food Insecurity: No Food Insecurity  . Worried About Charity fundraiser in the Last Year: Never true  . Ran Out of Food in the Last Year: Never true  Transportation Needs: No Transportation Needs  . Lack of Transportation (Medical): No  . Lack of Transportation (Non-Medical): No  Physical Activity: Not on file  Stress: Not on file  Social Connections: Not on file    Allergies:  Allergies  Allergen Reactions  . Codeine Nausea And Vomiting    Objective:    Vital Signs:   Temp:  [97.9 F (36.6 C)-98.3 F (36.8 C)] 97.9 F (36.6 C) (03/21 2154) Pulse Rate:  [72-89] 72 (03/22 1000) Resp:  [10-22] 11 (03/22 1000) BP: (134-202)/(62-92) 141/68 (03/22 1000) SpO2:  [90 %-98 %] 91 % (03/22 1000)    Weight change: There were no vitals filed for this visit.  Intake/Output:  No intake or output data in the 24 hours ending 08/14/20 1009    Physical Exam    General:  Morbidly obese female, not in distress HEENT: normal Neck: supple. JVP + . Carotids 2+ bilat; no bruits. No lymphadenopathy or thyromegaly appreciated. Cor: PMI nondisplaced. Regular rate & rhythm. No rubs, gallops or murmurs. Lungs: decreased breath sounds in bases, upper lung fields clear Abdomen: tight, distended,  normal bowel sounds. Extremities: 2+ non pitting edema bilaterally Neuro: alert & orientedx3, cranial nerves grossly intact. moves all 4 extremities w/o difficulty. Affect pleasant   Telemetry   NSR 80's-90's  EKG    NSR, rate 85, RBBB  Labs   Basic Metabolic Panel: Recent Labs  Lab 08/13/20 1242 08/13/20 2029 08/14/20 0500  NA 132* 131* 131*  K 5.8* 5.7* 4.4  CL 100 101 101  CO2 18* 19* 18*  GLUCOSE 215* 333* 402*  BUN 104* 104* 101*  CREATININE 4.17* 3.91* 3.29*  CALCIUM 8.6 9.0 8.9    Liver Function Tests: Recent Labs  Lab  08/13/20 1242  AST 26  ALT 52*  BILITOT 0.8  PROT 5.8*   No results for input(s): LIPASE, AMYLASE in the last 168 hours. No results for input(s): AMMONIA in the last 168 hours.  CBC: Recent Labs  Lab 08/13/20 1242 08/13/20 2029 08/14/20 0500  WBC 4.8 5.8 5.5  NEUTROABS 3,442  --   --   HGB 9.0* 9.4* 9.6*  HCT 28.9* 30.8* 31.5*  MCV 84.8 86.5 86.3  PLT 155 167 174    Cardiac Enzymes: No results for input(s): CKTOTAL, CKMB, CKMBINDEX, TROPONINI in the last 168 hours.  BNP: BNP (last 3 results) Recent Labs    05/24/20 1755 07/24/20 1430 08/13/20 1242  BNP 886.0* 471.7* 814*    ProBNP (last 3 results) No results for input(s): PROBNP in the last 8760 hours.   CBG: Recent Labs  Lab 08/14/20 0720  GLUCAP 400*    Coagulation Studies: No  results for input(s): LABPROT, INR in the last 72 hours.   Imaging   DG Chest 2 View  Result Date: 08/13/2020 CLINICAL DATA:  Shortness of breath EXAM: CHEST - 2 VIEW COMPARISON:  None. FINDINGS: The heart size and mediastinal contours are within normal limits. Patchy airspace opacity seen at the right lung base. Aortic knob calcifications are noted. The visualized skeletal structures are unremarkable. IMPRESSION: Patchy airspace opacity at the right lung base which may be due to atelectasis or infectious etiology Electronically Signed   By: Prudencio Pair M.D.   On: 08/13/2020 21:09   US RENAL  Result Date: 08/14/2020 CLINICAL DATA:  Acute kidney injury, CKD EXAM: RENAL / URINARY TRACT ULTRASOUND COMPLETE COMPARISON:  05/17/2020 CT FINDINGS: Right Kidney: Renal measurements: 13.9 x 5.6 x 5 x 5 cm = volume: 227.9 mL. Mildly increased renal cortical echogenicity. Anechoic simple appearing 1.5 x 1.2 x 1.3 cm renal sinus cyst. No concerning renal mass, shadowing calculus or hydronephrosis. Left Kidney: Renal measurements: 11.7 x 5.9 x 4.8 cm = volume: 171.9 mL. Mildly increased renal cortical echogenicity. No concerning renal mass,  shadowing calculus or hydronephrosis. Bladder: Partial decompression of the urinary bladder. No gross abnormality accounting for underdistention. Other: Technically challenging exam given patient body habitus and bowel gas. IMPRESSION: 1. Mildly increased renal cortical echogenicity, can be seen with medical renal disease. 2. 1.5 cm simple appearing right renal sinus cyst corresponding well with comparison CT. Electronically Signed   By: Lovena Le M.D.   On: 08/14/2020 04:31      Medications:     Current Medications: . allopurinol  100 mg Oral BID  . aspirin EC  81 mg Oral Q breakfast  . carvedilol  12.5 mg Oral BID WC  . clopidogrel  75 mg Oral Q breakfast  . ferrous sulfate  325 mg Oral BID WC  . FLUoxetine  10 mg Oral Daily  . heparin  5,000 Units Subcutaneous Q8H  . insulin aspart  0-5 Units Subcutaneous QHS  . insulin aspart  0-6 Units Subcutaneous TID WC  . levothyroxine  50 mcg Oral Q0600  . pregabalin  150 mg Oral BID  . traZODone  200 mg Oral QHS     Infusions:    Assessment/Plan   Acute on Chronic Diastolic Heart Failure:  - NICM, Echo 06/2017: EF 55-60%, grade 1 DD - Echo 03/19/18: EF 55-60%, grade 1 DD - Echo 01/2020 EF 55-60%. RV normal  - worsened in the setting of continued increased fluid intake and poor dietary choices, likely also contribution from worsening renal function in the setting of ATN - Trial dose of IV lasix 80mg , if no improvement will take for RHC   AKI on CKD Stage IV - Based on history may have developed ATN from severe vomiting while taking diuretics, possible bp dropped acutely and developed ATN which worsened volume status further -monitor renal fx closely with diuresis  CAD: history of CAD with BMS in 2007.  Low risk nuc study in 2018  - No chest pain.  - Continue DAPT w/ ASA + Plavix.  - Continue ? blocker therapy. - Off statin due to previously elevated LFTs.    HTN:  - only on carvedilol 12.5mg  BID usually controlled but now up  likely due to excess volume   Uncontrolled DM:  -A1C now back up to 11 -Follows closely with Dr. Bubba Camp. Has an insulin pump -insulin dosing here per primary, not a candidate for SGLT2i  S/p right transmetatarsal amputation -  Followed by Dr Francesca Jewett of Stay: 0  Katherine Roan, MD  08/14/2020, 10:09 AM  Advanced Heart Failure Team Pager [phone] (M-F; 7a - 4p)  Please contact [facility] for night-coverage after hours (4p -7a ) and weekends on [email or web address]  Agree with above.   Patient well known to me from clinic. Morbidly obese woman with chronic diastolic function, CKD IV, CAD, poorly controlled diabetes and frequent UTIs  Now admitted with AKI and progressive volume overloaded in setting of recurrent UTI and probable ATN.   Renal function now starting to improve.   Quite limited functional capacity at home and get around with walker.   General:  Obese woman lying in bed  No resp difficulty HEENT: normal Neck: supple JVP to jaw  Carotids 2+ bilat; no bruits. No lymphadenopathy or thryomegaly appreciated. Cor: PMI nondisplaced. Regular rate & rhythm. No rubs, gallops or murmurs. Lungs: clear Abdomen: obese soft, nontender, nondistended. No hepatosplenomegaly. No bruits or masses. Good bowel sounds. Extremities: no cyanosis, clubbing, rash, 2+ edema Neuro: alert & orientedx3, cranial nerves grossly intact. moves all 4 extremities w/o difficulty. Affect pleasant  Suspect ATN due to recurrent UTI possible urosepsis.Renal function now improving. Will start IV lasix. Treatment of UTI per primary team. Will involve HF SW team to help with high health resource utilization.   Glori Bickers, MD  4:32 PM

## 2020-08-14 NOTE — H&P (Signed)
History and Physical    Susan [name] ION:[id number] DOB: June 02, 1950 DOA: 08/13/2020  PCP: [name], MD Patient coming from: Home  Chief Complaint: Abnormal labs  HPI: Susan [name] is a 70 y.o. female with medical history significant of CAD, chronic diastolic CHF, hypertension, hyperlipidemia, hypothyroidism, insulin-dependent type II diabetes, CKD stage IV, morbid obesity (BMI 44.22), peripheral vascular disease, chronic neuropathy sent to the hospital by her primary care provider because of abnormal labs -hemoglobin 9.0, BNP 814, BUN 104, creatinine 4.1, potassium 5.8.  Patient states she had labs done at her PCPs office and she was told to come into the hospital as they were concerned about problem with her heart and kidneys.  States she is having shortness of breath and swelling in her legs for several weeks for which she was seen by her cardiologist earlier this month and they increased the dose of her home torsemide.  For the last 3 days she is urinating very little and her urine is brown in color.  States she has gained 10 pounds in the past week.  States 3 days ago she felt nauseous and vomited several times.  Nausea and vomiting have now resolved and she is able to tolerate p.o. intake.  No other complaints.  Reports history of Covid infection last year and is now fully vaccinated including booster shot.  ED Course: Blood pressure elevated, remainder of vital signs stable.  Labs in the ED showing WBC 5.8, hemoglobin 9.4, platelet count 167K.  Sodium 131, potassium 5.7, chloride 101, bicarb 19, anion gap 11, BUN 104, creatinine 3.9, glucose 333.  SARS-CoV-2 PCR test pending.  UA with negative nitrite, moderate amount of leukocytes, greater than 50 WBCs, and rare bacteria.  Chest x-ray showing patchy airspace opacity at the right lung base.  EKG without acute changes.  Patient was given Lokelma.  Review of Systems:  All systems reviewed and apart from history of presenting illness, are  negative.  Past Medical History:  Diagnosis Date  . Acute MI ([place]) 1999; 2007  . Anemia    hx  . Anginal pain ([place])   . Anxiety   . ARF (acute renal failure) ([place]) 06/2017   [facility]  . Arthritis    "generalized" (03/15/2014)  . CAD (coronary artery disease)    MI in 2000 - MI  2007 - treated bare metal stent (no nuclear since then as 9/11)  . Carotid artery disease ([place])   . CHF (congestive heart failure) ([place])   . Chronic diastolic heart failure (HCC)    a) ECHO (08/2013) EF 55-60% and RV function nl b) RHC (08/2013) RA 4, RV 30/5/7, PA 25/10 (16), PCWP 7, Fick CO/CI 6.3/2.7, PVR 1.5 WU, PA 61 and 66%  . Daily headache    "~ every other day; since I fell in June" (03/15/2014)  . Depression   . Dyslipidemia   . Dyspnea   . Exertional shortness of breath   . HTN (hypertension)   . Hypothyroidism   . Neuropathy   . Obesity   . Osteoarthritis   . Peripheral neuropathy   . PONV (postoperative nausea and vomiting)   . RBBB (right bundle branch block)    Old  . Stroke [facility])    mini strokes  . Syncope    likely due to low blood sugar  . Tachycardia    Sinus tachycardia  . Type II diabetes mellitus (HCC)    Type II  . Urinary incontinence   . Venous insufficiency  Past Surgical History:  Procedure Laterality Date  . ABDOMINAL HYSTERECTOMY  1980's  . AMPUTATION Right 02/24/2018   Procedure: RIGHT FOOT GREAT TOE AND 2ND TOE AMPUTATION;  Surgeon: Newt Minion, MD;  Location: [place];  Service: Orthopedics;  Laterality: Right;  . AMPUTATION Right 04/30/2018   Procedure: RIGHT TRANSMETATARSAL AMPUTATION;  Surgeon: Newt Minion, MD;  Location: [place];  Service: Orthopedics;  Laterality: Right;  . BIOPSY  05/27/2020   Procedure: BIOPSY;  Surgeon: [name], DO;  [facility];  Service: Endoscopy;;  . CATARACT EXTRACTION, BILATERAL Bilateral ?2013  . COLONOSCOPY W/ POLYPECTOMY    . COLONOSCOPY WITH PROPOFOL N/A 03/13/2019   Procedure:  COLONOSCOPY WITH PROPOFOL;  Surgeon: Jerene Bears, MD;  Location: [place];  Service: Gastroenterology;  Laterality: N/A;  . [place]; 2007   "1 + 1"  . ESOPHAGOGASTRODUODENOSCOPY (EGD) WITH PROPOFOL N/A 03/13/2019   Procedure: ESOPHAGOGASTRODUODENOSCOPY (EGD) WITH PROPOFOL;  Surgeon: Jerene Bears, MD;  Location: [facility] ENDOSCOPY;  Service: Gastroenterology;  Laterality: N/A;  . ESOPHAGOGASTRODUODENOSCOPY (EGD) WITH PROPOFOL N/A 05/27/2020   Procedure: ESOPHAGOGASTRODUODENOSCOPY (EGD) WITH PROPOFOL;  Surgeon: [name], DO;  [facility];  Service: Endoscopy;  Laterality: N/A;  . EYE SURGERY Bilateral    lazer  . HEMOSTASIS CLIP PLACEMENT  03/13/2019   Procedure: HEMOSTASIS CLIP PLACEMENT;  Surgeon: Jerene Bears, MD;  Location: [facility] ENDOSCOPY;  Service: Gastroenterology;;  . KNEE ARTHROSCOPY Left 10/25/2006  . POLYPECTOMY  03/13/2019   Procedure: POLYPECTOMY;  Surgeon: Jerene Bears, MD;  Location: [place];  Service: Gastroenterology;;  . RIGHT HEART CATH N/A 07/24/2017   Procedure: RIGHT HEART CATH;  Surgeon: Jolaine Artist, MD;  Location: [place] CV LAB;  Service: Cardiovascular;  Laterality: N/A;  . RIGHT HEART CATHETERIZATION N/A 09/22/2013   Procedure: RIGHT HEART CATH;  Surgeon: Jolaine Artist, MD;  Location: [facility] CATH LAB;  Service: Cardiovascular;  Laterality: N/A;  . SHOULDER ARTHROSCOPY WITH OPEN ROTATOR CUFF REPAIR Right 03/14/2014   Procedure: RIGHT SHOULDER ARTHROSCOPY WITH BICEPS RELEASE, OPEN SUBSCAPULA REPAIR, OPEN SUPRASPINATUS REPAIR.;  Surgeon: Meredith Pel, MD;  Location: [place];  Service: Orthopedics;  Laterality: Right;  . TOE AMPUTATION Right 02/24/2018   GREAT TOE AND 2ND TOE AMPUTATION  . TUBAL LIGATION  1970's     reports that she quit smoking about 22 years ago. Her smoking use included cigarettes. She has a 96.00 pack-year smoking history. She has never used smokeless tobacco. She reports  previous alcohol use. She reports that she does not use drugs.  Allergies  Allergen Reactions  . Codeine Nausea And Vomiting    Family History  Problem Relation Age of Onset  . Heart attack Mother 75    Prior to Admission medications   Medication Sig Start Date End Date Taking? Authorizing Provider  acetaminophen (TYLENOL) 500 MG tablet Take 1,000 mg by mouth every 6 (six) hours as needed for moderate pain or headache.   Yes [provider]  albuterol (VENTOLIN HFA) 108 (90 Base) MCG/ACT inhaler TAKE 2 PUFFS BY MOUTH EVERY 6 HOURS AS NEEDED FOR WHEEZE OR SHORTNESS OF BREATH Patient taking differently: Inhale 2 puffs into the lungs every 6 (six) hours as needed for wheezing or shortness of breath. 06/27/19  Yes [place], [name], MD  allopurinol (ZYLOPRIM) 100 MG tablet TAKE 1 TABLET BY MOUTH TWICE A DAY Patient taking differently: Take 100 mg by mouth 2 (two) times  daily. 08/02/19  Yes Newt Minion, MD  Ascorbic Acid (VITAMIN C) 1000 MG tablet Take 1,000 mg by mouth daily.   Yes [provider]  aspirin EC 81 MG tablet Take 1 tablet (81 mg total) by mouth daily with breakfast. 05/28/20  Yes Emokpae, Courage, MD  Bismuth Tribromoph-Petrolatum (XEROFORM PETROLAT GAUZE 5"X9") MISC Apply 1 each topically 2 (two) times daily. For dressing changes 05/28/20  Yes Emokpae, Courage, MD  carvedilol (COREG) 12.5 MG tablet TAKE 1 TABLET (12.5 MG TOTAL) BY MOUTH 2 (TWO) TIMES DAILY WITH A MEAL. 08/13/20  Yes [name], MD  cholecalciferol (VITAMIN D) 1000 units tablet Take 1,000 Units by mouth daily with supper.    Yes [provider]  clopidogrel (PLAVIX) 75 MG tablet Take 1 tablet (75 mg total) by mouth daily with breakfast. 05/23/20  Yes Clegg, Amy D, NP  ferrous sulfate 325 (65 FE) MG tablet Take 1 tablet (325 mg total) by mouth 2 (two) times daily with a meal. Reported on 08/21/2015 06/07/18  Yes [place], [name], MD  FLUoxetine (PROZAC) 20 MG capsule Take 10 mg by mouth  daily.   Yes [provider]  guaiFENesin-dextromethorphan (ROBITUSSIN DM) 100-10 MG/5ML syrup Take 10 mLs by mouth every 4 (four) hours as needed for cough. 03/13/20  Yes [place] Eric J, DO  Insulin Disposable Pump (OMNIPOD DASH 5 PACK PODS) MISC Inject into the skin See admin instructions. Use continuously with Novolin R - change every 72 hours. 11/18/18  Yes [provider]  insulin NPH Human (HUMULIN N,NOVOLIN N) 100 UNIT/ML injection Inject 15 Units into the skin See admin instructions. Inject 15 units subcutaneously in the morning if CBG >300;   Yes [provider]  insulin regular (NOVOLIN R) 100 units/mL injection Inject into the skin See admin instructions. Manually add bolus to continuous dose via OmniPod 3 times daily per sliding scale (CBG 80-150 7 units, 151-200 9 units, 201-250 12 units, 251-300 14 units, 301- 400 17 units)   Yes [provider]  levothyroxine (SYNTHROID, LEVOTHROID) 50 MCG tablet Take 1 tablet (50 mcg total) by mouth daily before breakfast. 11/07/16  Yes Dixon, Mary B, PA-C  magnesium oxide (MAG-OX) 400 MG tablet TAKE 1 TABLET BY MOUTH EVERY DAY Patient taking differently: Take 400 mg by mouth daily. 07/23/20  Yes [place], [name], MD  Multiple Vitamins-Minerals (AIRBORNE GUMMIES PO) Take 2 tablets by mouth every evening.   Yes [provider]  Multiple Vitamins-Minerals (CENTRUM SILVER 50+WOMEN) TABS Take 1 tablet by mouth every evening.   Yes [provider]  Multiple Vitamins-Minerals (ZINC PO) Take 1 tablet by mouth every evening.   Yes [provider]  nitroGLYCERIN (NITROSTAT) 0.4 MG SL tablet Place 1 tablet (0.4 mg total) under the tongue every 5 (five) minutes as needed for chest pain. 04/28/19  Yes Clegg, Amy D, NP  ondansetron (ZOFRAN) 4 MG tablet Take 1 tablet (4 mg total) by mouth every 8 (eight) hours as needed for nausea or vomiting. 05/28/20 05/28/21 Yes Roxan Hockey, MD  [facility] VERIO test strip  3 (three) times daily. 07/25/20  Yes [provider]  potassium chloride SA (KLOR-CON) 20 MEQ tablet Take 20 mEq by mouth daily.   Yes [provider]  pregabalin (LYRICA) 150 MG capsule Take 1 capsule (150 mg total) by mouth 2 (two) times daily. 11/12/16  Yes [place], [name], MD  torsemide (DEMADEX) 20 MG tablet Take 80-100 mg by mouth See admin instructions. 100 mg  in the AM and 80 mg in the PM   Yes [provider]  traZODone (DESYREL) 100 MG tablet TAKE 1 AND 1/2 TABLETS BY MOUTH AT BEDTIME AS NEEDED FOR SLEEP. Patient taking differently: Take 200 mg by mouth at bedtime. 01/26/20  Yes [place], [name], MD  ursodiol (ACTIGALL) 500 MG tablet Take 500 mg by mouth 3 (three) times daily. 09/09/19  Yes [provider]  buPROPion (WELLBUTRIN XL) 150 MG 24 hr tablet Take 1 tablet (150 mg total) by mouth daily. For depression Patient not taking: Reported on 08/14/2020 06/30/20   Alycia Rossetti, MD    Physical Exam: Vitals:   08/14/20 0215 08/14/20 0300 08/14/20 0330 08/14/20 0400  BP: (!) 175/92 (!) 176/78 134/62 (!) 176/89  Pulse: 84 84 78 83  Resp: 12 12 14 12   Temp:      TempSrc:      SpO2: 96% 93% 92% 95%    Physical Exam Constitutional:      General: She is not in acute distress. HENT:     Head: Normocephalic and atraumatic.  Eyes:     Extraocular Movements: Extraocular movements intact.     Conjunctiva/sclera: Conjunctivae normal.  Cardiovascular:     Rate and Rhythm: Normal rate and regular rhythm.     Pulses: Normal pulses.  Pulmonary:     Effort: Pulmonary effort is normal. No respiratory distress.     Breath sounds: Normal breath sounds. No wheezing or rales.  Abdominal:     General: Bowel sounds are normal. There is no distension.     Palpations: Abdomen is soft.     Tenderness: There is no abdominal tenderness.  Musculoskeletal:     Cervical back: Normal range of motion and neck supple.     Right lower leg: Edema present.     Left  lower leg: Edema present.  Skin:    General: Skin is warm and dry.     Comments: Venous stasis dermatitis of bilateral lower extremities  Neurological:     General: No focal deficit present.     Mental Status: She is alert and oriented to person, place, and time.     Labs on Admission: I have personally reviewed following labs and imaging studies  CBC: Recent Labs  Lab 08/13/20 1242 08/13/20 2029  WBC 4.8 5.8  NEUTROABS 3,442  --   HGB 9.0* 9.4*  HCT 28.9* 30.8*  MCV 84.8 86.5  PLT 155 332   Basic Metabolic Panel: Recent Labs  Lab 08/13/20 1242 08/13/20 2029  NA 132* 131*  K 5.8* 5.7*  CL 100 101  CO2 18* 19*  GLUCOSE 215* 333*  BUN 104* 104*  CREATININE 4.17* 3.91*  CALCIUM 8.6 9.0   GFR: Estimated Creatinine Clearance: 17.8 mL/min (A) (by C-G formula based on SCr of 3.91 mg/dL (H)). Liver Function Tests: Recent Labs  Lab 08/13/20 1242  AST 26  ALT 52*  BILITOT 0.8  PROT 5.8*   No results for input(s): LIPASE, AMYLASE in the last 168 hours. No results for input(s): AMMONIA in the last 168 hours. Coagulation Profile: No results for input(s): INR, PROTIME in the last 168 hours. Cardiac Enzymes: No results for input(s): CKTOTAL, CKMB, CKMBINDEX, TROPONINI in the last 168 hours. BNP (last 3 results) No results for input(s): PROBNP in the last 8760 hours. HbA1C: No results for input(s): HGBA1C in the last 72 hours. CBG: No results for input(s): GLUCAP in the last 168 hours. Lipid Profile: No results for input(s):  CHOL, HDL, LDLCALC, TRIG, CHOLHDL, LDLDIRECT in the last 72 hours. Thyroid Function Tests: No results for input(s): TSH, T4TOTAL, FREET4, T3FREE, THYROIDAB in the last 72 hours. Anemia Panel: No results for input(s): VITAMINB12, FOLATE, FERRITIN, TIBC, IRON, RETICCTPCT in the last 72 hours. Urine analysis:    Component Value Date/Time   COLORURINE YELLOW 08/14/2020 0100   APPEARANCEUR HAZY (A) 08/14/2020 0100   LABSPEC 1.010 08/14/2020 0100    PHURINE 5.0 08/14/2020 0100   GLUCOSEU 150 (A) 08/14/2020 0100   HGBUR NEGATIVE 08/14/2020 0100   BILIRUBINUR NEGATIVE 08/14/2020 0100   BILIRUBINUR negative 06/05/2020 1334   KETONESUR NEGATIVE 08/14/2020 0100   PROTEINUR 30 (A) 08/14/2020 0100   UROBILINOGEN 0.2 06/05/2020 1334   UROBILINOGEN 0.2 04/01/2014 1659   NITRITE NEGATIVE 08/14/2020 0100   LEUKOCYTESUR MODERATE (A) 08/14/2020 0100    Radiological Exams on Admission: DG Chest 2 View  Result Date: 08/13/2020 CLINICAL DATA:  Shortness of breath EXAM: CHEST - 2 VIEW COMPARISON:  None. FINDINGS: The heart size and mediastinal contours are within normal limits. Patchy airspace opacity seen at the right lung base. Aortic knob calcifications are noted. The visualized skeletal structures are unremarkable. IMPRESSION: Patchy airspace opacity at the right lung base which may be due to atelectasis or infectious etiology Electronically Signed   By: Prudencio Pair M.D.   On: 08/13/2020 21:09    EKG: Independently reviewed.  Sinus rhythm, RBBB.  No acute changes.  Assessment/Plan Principal Problem:   AKI (acute kidney injury) ([place]) Active Problems:   Hyperglycemia due to diabetes mellitus ([place])   Hypothyroidism   CHF exacerbation ([place])   Hypertensive urgency   AKI on CKD stage IV: BUN 104, creatinine 3.9, GFR 12. Creatinine was 2.2 about 3 weeks ago but previously as low as 1.4 in January this year.  Suspect this is due to aggressive diuresis for her decompensated heart failure.  She was seen by cardiology on 3/1 for volume overload and dose of her home torsemide was increased.  Patient reports 10 pound weight gain in the past week and minimal urine output for the past few days. -Holding diuretics at this time.  Check urine sodium and creatinine.  Order renal ultrasound.  Avoid nephrotoxic agents.  Monitor renal function and urine output very closely.  Please consult nephrology in the morning.  Acute on chronic diastolic CHF: Patient is  volume overloaded.  Has significant bilateral lower extremity edema, hypertensive, and BNP 814.  Reports 10 pound weight gain in the past week. Chest x-ray not suggestive of pulmonary edema.  No hypoxia or signs of respiratory distress. -Holding diuretics at this time given AKI.  Dietary fluid restriction, daily weights.  Please consult cardiology in the morning.  Hypertensive urgency: Systolic up to 102V. -Continue Coreg.  Hydralazine PRN.   Acute on chronic anemia: Hemoglobin currently 9.4, previously in the 10-11 range.  Not endorsing any GI bleed symptoms.  Suspect anemia is due to her chronic kidney disease. -Continue home iron supplement.  Management per nephrology.  Mild hyperkalemia: Likely due to acute on chronic kidney injury and home potassium supplement use.  No acute EKG changes. -Patient was given Lokelma in the ED.  Repeat labs to check potassium level.  Hold home potassium supplement.  Mild normal anion gap metabolic acidosis: Likely due to acute on chronic kidney injury. -Continue to monitor  Hypothyroidism -Continue Synthroid  Gout -Continue allopurinol  Depression -Continue Prozac, trazodone  CAD: Not endorsing chest pain. -Continue aspirin, Plavix, beta-blocker.  Statin stopped  by cardiology due to previously elevated LFTs.  Poorly controlled insulin-dependent type 2 diabetes with hyperglycemia: A1c was 8.7 on 05/17/2020 blood glucose 333.  No signs of DKA.  Bicarb mildly low in the setting of acute on chronic kidney injury.  Anion gap is normal and no ketones on UA. -Repeat A1c.  Continue home basal insulin.  Sliding scale insulin very sensitive ACHS.    Diabetic neuropathy -Continue Lyrica  Abnormal urinalysis: UA with evidence of pyuria but no significant bacteriuria and nitrite negative.  Patient is not endorsing any UTI symptoms.  No fever, leukocytosis, or signs of sepsis. -Urine culture added on  Abnormality on chest x-ray: Chest x-ray showing patchy  airspace opacity at the right lung base, likely atelectasis.  Pneumonia less likely given no fever or leukocytosis. -Check procalcitonin level  DVT prophylaxis: Subcutaneous heparin Code Status: Patient wishes to be full code. Family Communication: No family available at this time.  Diagnostic findings and treatment plan discussed with the patient at length. Disposition Plan: Status is: Inpatient  Remains inpatient appropriate because:Inpatient level of care appropriate due to severity of illness   Dispo: The patient is from: Home              Anticipated d/c is to: Home              Patient currently is not medically stable to d/c.   Difficult to place patient No  Level of care: Level of care: Telemetry Medical   The medical decision making on this patient was of high complexity and the patient is at high risk for clinical deterioration, therefore this is a level 3 visit.  Shela Leff MD [facility]  If 7PM-7AM, please contact night-coverage [email or web address]  08/14/2020, 4:07 AM

## 2020-08-14 NOTE — ED Notes (Signed)
Kidney US being done at bedside

## 2020-08-14 NOTE — Progress Notes (Signed)
Inpatient Diabetes Program Recommendations  AACE/ADA: New Consensus Statement on Inpatient Glycemic Control (2015)  Target Ranges:  Prepandial:   less than 140 mg/dL      Peak postprandial:   less than 180 mg/dL (1-2 hours)      Critically ill patients:  140 - 180 mg/dL   Results for Susan [name], Susan [name] (MRN [id number]) as of 08/14/2020 06:53  Ref. Range 08/13/2020 20:29 08/14/2020 05:00  Glucose Latest Ref Range: 70 - 99 mg/dL 333 (H) 402 (H)   Results for Susan [name], Susan [name] (MRN [id number]) as of 08/14/2020 06:53  Ref. Range 12/21/2019 15:50 03/09/2020 00:55 05/17/2020 16:59 08/14/2020 04:01  Hemoglobin A1C Latest Ref Range: 4.8 - 5.6 % 11.8 (H) 11.0 (H) 8.7 (H) 11.0 (H)  (269 mg/dl)    To ED with Abnormal labs -hemoglobin 9.0, BNP 814, BUN 104, creatinine 4.1, potassium 5.8 AKI on CKD stage IV Acute on chronic diastolic CHF Hypertensive urgency Acute on chronic anemia Mild hyperkalemia  History: DM, CHF, CKD  Home DM Meds: NPH Insulin 15 units QAM if CBG >300       NPH Insulin 10 units QPM if CBG >200       Omni Pod Insulin Pump (Total Basal Rate 32.2 units + Uses Set Dose Boluses 8-17 units)      Current Orders: Novolog 0-6 units TID AC + HS      NPH Insulin 15 units QAM    Note Novolog SSI to start this AM  NPH Insulin started this AM   MD--Please consider the following based on current CBGs and home Insulin dosing patterns:  1. Stop NPH Insulin for now  2. Start Levemir 40 units Daily (~0.3 units/kg)--Pt gets 32.2 units basal insulin per 24 hour period on her pump and also has been taking NPH 15 units QAM at home when CBG >300  3. Start Novolog Meal Coverage: Novolog 6 units TID with meals  Hold if pt eats <50% of meal, Hold if pt NPO    Met with pt down in the ED.  Reviewed current A1c of 11%--pt disappointed to hear that her A1c rose from 8.7% in December to 11% currently.  Sees Dr. [name] with [facility] for diabetes care--Not sure when next appt is but  has Ipad at home with all appts listed.  Pt stated she has her husband place her Omni Pod insulin pump every 3 days on her upper arm.  No issues with using pump.  Also boluses with the pump but did state she will miss her 12pm dose of Insulin sometimes if she doesn't eat her breakfast until late.  Checks CBGs regularly (usually TID AC)--Writes all CBGs down for ENDO.  Does have a fear of Hypoglycemia but has not had any recent Hypoglycemia at home.  We reviewed appropriate treatment for HYPO at home.    Discussed with pt that her CBGs are currently >350.  Pt told me her Omni Pod ran out of insulin around 4am today and that she removed it.  Does not have extra pump supplies with her.  Discussed with pt that we will give her SQ Insulin injections while she is off her pump and that she can likely resume her pump when she goes home--Pt agreeable to this plan.   Per patient she takes the following Insulin at home: NPH Insulin 15 units QAM if CBG >300 NPH Insulin 10 units QPM if CBG >200 Omni Pod Insulin Pump (Total Basal Rate 32.2 units + Uses Set Dose Boluses 8-17  units) Basal Rates on Pump: 12am- 1.05 units/hr 5am- 1.1 units/hr 9am- 1.7 units/hr 7:30pm- 1.05 units/hr Total Basal per 24 hour period= 32.2 units Preset Boluses on Omni Pod Pump 80-150- 8 units 151-200- 10 units 201-250- 12 units 251-300- 14 units 301-400- 17 units    --Will follow patient during hospitalization--  Wyn Quaker RN, MSN, CDE Diabetes Coordinator Inpatient Glycemic Control Team Team Pager: [phone] (8a-5p)

## 2020-08-14 NOTE — ED Notes (Signed)
Diabetes educator at bedside.

## 2020-08-14 NOTE — ED Notes (Signed)
RN notified of pts blood sugar

## 2020-08-14 NOTE — Progress Notes (Signed)
PROGRESS NOTE        PATIENT DETAILS Name: Susan [name] Age: 70 y.o. Sex: female Date of Birth: November 16, 1950 Admit Date: 08/13/2020 Admitting Physician Shela Leff, MD FAO:[id number], [name], MD  Brief Narrative: Patient is a 70 y.o. female history of CKD stage IV, chronic diastolic heart failure, CAD, history of right transmetatarsal amputation-presented with decompensated diastolic heart failure and AKI.  See below for further details.  Significant events: 3/21>> admit to [facility] for AKI/decompensated heart failure  Significant studies: 3/21>> chest x-ray: Patchy airspace opacity in the right lung base 3/22>> renal ultrasound: No hydronephrosis.  1.5 cm right renal cyst  Antimicrobial therapy: None  Microbiology data: 3/22>> Urine culture: Pending  Procedures : None  Consults: Advanced heart failure team.  DVT Prophylaxis : heparin injection 5,000 Units Start: 08/14/20 0600  Subjective: Awake/alert-no chest pain.  Had 1 episode of vomiting on Friday-no diarrhea.  Assessment/Plan: Acute on chronic diastolic heart failure: Grossly volume overloaded-likely secondary to dietary noncompliance-advanced CHF team following-plans are for IV Lasix 80 mg x 1-and possible RHC.  AKI on CKD stage IV: AKI likely hemodynamically mediated-in the setting of vomiting x1-diuretic use-creatinine has improved-however BUN is still significantly elevated.  Watch closely-if no improvement-or difficult to diurese-May need renal involvement at some point.  CAD: No anginal symptoms-continue aspirin/Plavix/beta-blocker  Insulin-dependent DM-2 (A1c 11.0 on 3/22) with uncontrolled hyperglycemia: On insulin pump at home-her husband helps her manage it at home-this is not going to be possible in the hospital-hence will place her on Lantus/NovoLog insulin.  Appreciate diabetic coordinator input-based on her regimen via insulin pump-we will start Lantus 40 units daily, 6 units of  NovoLog with meals and SSI.  We will follow and adjust accordingly.  Peripheral neuropathy: Continue Lyrica  Hypothyroidism: Continue Synthroid  Gout: Continue allopurinol  Depression: Stable-continue Prozac/trazodone  Morbid Obesity: Estimated body mass index is 44.22 kg/m as calculated from the following:   Height as of an earlier encounter on 08/13/20: 5\' 6"  (1.676 m).   Weight as of an earlier encounter on 08/13/20: 124.3 kg.   Diet: Diet Order            Diet heart healthy/carb modified Room service appropriate? Yes; Fluid consistency: Thin; Fluid restriction: 1200 mL Fluid  Diet effective now                  Code Status: Full code   Family Communication: Spouse-Edward-[phone]-left voicemail on 3/22   Disposition Plan: Status is: Inpatient  Remains inpatient appropriate because:Inpatient level of care appropriate due to severity of illness   Dispo: The patient is from: Home              Anticipated d/c is to: Home              Patient currently is not medically stable to d/c.   Difficult to place patient No   Barriers to Discharge: Volume overload-uncontrolled hyperglycemia-AKI on IV Lasix-May require RHC.  Antimicrobial agents: Anti-infectives (From admission, onward)   None      Time spent 35 minutes-Greater than 50% of this time was spent in counseling, explanation of diagnosis, planning of further management, and coordination of care.  MEDICATIONS: Scheduled Meds: . allopurinol  100 mg Oral BID  . aspirin EC  81 mg Oral Q breakfast  . carvedilol  12.5 mg Oral BID WC  .  clopidogrel  75 mg Oral Q breakfast  . ferrous sulfate  325 mg Oral BID WC  . FLUoxetine  10 mg Oral Daily  . heparin  5,000 Units Subcutaneous Q8H  . insulin aspart  0-5 Units Subcutaneous QHS  . insulin aspart  0-6 Units Subcutaneous TID WC  . levothyroxine  50 mcg Oral Q0600  . pregabalin  150 mg Oral BID  . traZODone  200 mg Oral QHS   Continuous Infusions: PRN  Meds:.albuterol, hydrALAZINE, insulin NPH Human   PHYSICAL EXAM: Vital signs: Vitals:   08/14/20 0700 08/14/20 0800 08/14/20 0900 08/14/20 1000  BP: 137/68 (!) 165/84 (!) 141/67 (!) 141/68  Pulse: 73 80 75 72  Resp: 13 12 13 11   Temp:      TempSrc:      SpO2: 94% 93% 94% 91%   There were no vitals filed for this visit. There is no height or weight on file to calculate BMI.   Gen Exam:Alert awake-not in any distress HEENT:atraumatic, normocephalic Chest: B/L clear to auscultation anteriorly CVS:S1S2 regular Abdomen:soft non tender, non distended Extremities:+++ edema Neurology: Non focal Skin: no rash  I have personally reviewed following labs and imaging studies  LABORATORY DATA: CBC: Recent Labs  Lab 08/13/20 1242 08/13/20 2029 08/14/20 0500  WBC 4.8 5.8 5.5  NEUTROABS 3,442  --   --   HGB 9.0* 9.4* 9.6*  HCT 28.9* 30.8* 31.5*  MCV 84.8 86.5 86.3  PLT 155 167 742    Basic Metabolic Panel: Recent Labs  Lab 08/13/20 1242 08/13/20 2029 08/14/20 0500  NA 132* 131* 131*  K 5.8* 5.7* 4.4  CL 100 101 101  CO2 18* 19* 18*  GLUCOSE 215* 333* 402*  BUN 104* 104* 101*  CREATININE 4.17* 3.91* 3.29*  CALCIUM 8.6 9.0 8.9    GFR: Estimated Creatinine Clearance: 21.1 mL/min (A) (by C-G formula based on SCr of 3.29 mg/dL (H)).  Liver Function Tests: Recent Labs  Lab 08/13/20 1242  AST 26  ALT 52*  BILITOT 0.8  PROT 5.8*   No results for input(s): LIPASE, AMYLASE in the last 168 hours. No results for input(s): AMMONIA in the last 168 hours.  Coagulation Profile: No results for input(s): INR, PROTIME in the last 168 hours.  Cardiac Enzymes: No results for input(s): CKTOTAL, CKMB, CKMBINDEX, TROPONINI in the last 168 hours.  BNP (last 3 results) No results for input(s): PROBNP in the last 8760 hours.  Lipid Profile: No results for input(s): CHOL, HDL, LDLCALC, TRIG, CHOLHDL, LDLDIRECT in the last 72 hours.  Thyroid Function Tests: No results for  input(s): TSH, T4TOTAL, FREET4, T3FREE, THYROIDAB in the last 72 hours.  Anemia Panel: No results for input(s): VITAMINB12, FOLATE, FERRITIN, TIBC, IRON, RETICCTPCT in the last 72 hours.  Urine analysis:    Component Value Date/Time   COLORURINE YELLOW 08/14/2020 0100   APPEARANCEUR HAZY (A) 08/14/2020 0100   LABSPEC 1.010 08/14/2020 0100   PHURINE 5.0 08/14/2020 0100   GLUCOSEU 150 (A) 08/14/2020 0100   HGBUR NEGATIVE 08/14/2020 0100   BILIRUBINUR NEGATIVE 08/14/2020 0100   BILIRUBINUR negative 06/05/2020 1334   KETONESUR NEGATIVE 08/14/2020 0100   PROTEINUR 30 (A) 08/14/2020 0100   UROBILINOGEN 0.2 06/05/2020 1334   UROBILINOGEN 0.2 04/01/2014 1659   NITRITE NEGATIVE 08/14/2020 0100   LEUKOCYTESUR MODERATE (A) 08/14/2020 0100    Sepsis Labs: Lactic Acid, Venous    Component Value Date/Time   LATICACIDVEN 0.9 05/17/2020 1141    MICROBIOLOGY: Recent Results (from the past  240 hour(s))  SARS CORONAVIRUS 2 (TAT 6-24 HRS) Nasopharyngeal Nasopharyngeal Swab     Status: None   Collection Time: 08/14/20 12:20 AM   Specimen: Nasopharyngeal Swab  Result Value Ref Range Status   SARS Coronavirus 2 NEGATIVE NEGATIVE Final    Comment: (NOTE) SARS-CoV-2 target nucleic acids are NOT DETECTED.  The SARS-CoV-2 RNA is generally detectable in upper and lower respiratory specimens during the acute phase of infection. Negative results do not preclude SARS-CoV-2 infection, do not rule out co-infections with other pathogens, and should not be used as the sole basis for treatment or other patient management decisions. Negative results must be combined with clinical observations, patient history, and epidemiological information. The expected result is Negative.  Fact Sheet for Patients: SugarRoll.be  Fact Sheet for Healthcare Providers: https://www.woods-mathews.com/  This test is not yet approved or cleared by the [place] FDA and  has  been authorized for detection and/or diagnosis of SARS-CoV-2 by FDA under an Emergency Use Authorization (EUA). This EUA will remain  in effect (meaning this test can be used) for the duration of the COVID-19 declaration under Se ction 564(b)(1) of the Act, 21 U.S.C. section 360bbb-3(b)(1), unless the authorization is terminated or revoked sooner.  Performed at [facility], [place] [address] [place], [place] [address]     RADIOLOGY STUDIES/RESULTS: DG Chest 2 View  Result Date: 08/13/2020 CLINICAL DATA:  Shortness of breath EXAM: CHEST - 2 VIEW COMPARISON:  None. FINDINGS: The heart size and mediastinal contours are within normal limits. Patchy airspace opacity seen at the right lung base. Aortic knob calcifications are noted. The visualized skeletal structures are unremarkable. IMPRESSION: Patchy airspace opacity at the right lung base which may be due to atelectasis or infectious etiology Electronically Signed   By: Prudencio Pair M.D.   On: 08/13/2020 21:09   US RENAL  Result Date: 08/14/2020 CLINICAL DATA:  Acute kidney injury, CKD EXAM: RENAL / URINARY TRACT ULTRASOUND COMPLETE COMPARISON:  05/17/2020 CT FINDINGS: Right Kidney: Renal measurements: 13.9 x 5.6 x 5 x 5 cm = volume: 227.9 mL. Mildly increased renal cortical echogenicity. Anechoic simple appearing 1.5 x 1.2 x 1.3 cm renal sinus cyst. No concerning renal mass, shadowing calculus or hydronephrosis. Left Kidney: Renal measurements: 11.7 x 5.9 x 4.8 cm = volume: 171.9 mL. Mildly increased renal cortical echogenicity. No concerning renal mass, shadowing calculus or hydronephrosis. Bladder: Partial decompression of the urinary bladder. No gross abnormality accounting for underdistention. Other: Technically challenging exam given patient body habitus and bowel gas. IMPRESSION: 1. Mildly increased renal cortical echogenicity, can be seen with medical renal disease. 2. 1.5 cm simple appearing right renal sinus cyst corresponding well  with comparison CT. Electronically Signed   By: Lovena Le M.D.   On: 08/14/2020 04:31     LOS: 0 days   Oren Binet, MD  [facility]    To contact the attending provider between 7A-7P or the covering provider during after hours 7P-7A, please log into the web site [email or web address] and access using universal [place] password for that web site. If you do not have the password, please call the hospital operator.  08/14/2020, 11:39 AM

## 2020-08-15 ENCOUNTER — Inpatient Hospital Stay (HOSPITAL_COMMUNITY): Payer: HMO

## 2020-08-15 DIAGNOSIS — N184 Chronic kidney disease, stage 4 (severe): Secondary | ICD-10-CM | POA: Diagnosis not present

## 2020-08-15 DIAGNOSIS — I5033 Acute on chronic diastolic (congestive) heart failure: Secondary | ICD-10-CM | POA: Diagnosis not present

## 2020-08-15 DIAGNOSIS — I2510 Atherosclerotic heart disease of native coronary artery without angina pectoris: Secondary | ICD-10-CM | POA: Diagnosis not present

## 2020-08-15 DIAGNOSIS — N179 Acute kidney failure, unspecified: Secondary | ICD-10-CM | POA: Diagnosis not present

## 2020-08-15 LAB — CBC
HCT: 31.4 % — ABNORMAL LOW (ref 36.0–46.0)
Hemoglobin: 10.1 g/dL — ABNORMAL LOW (ref 12.0–15.0)
MCH: 26.9 pg (ref 26.0–34.0)
MCHC: 32.2 g/dL (ref 30.0–36.0)
MCV: 83.7 fL (ref 80.0–100.0)
Platelets: 205 10*3/uL (ref 150–400)
RBC: 3.75 MIL/uL — ABNORMAL LOW (ref 3.87–5.11)
RDW: 18.3 % — ABNORMAL HIGH (ref 11.5–15.5)
WBC: 5.2 10*3/uL (ref 4.0–10.5)
nRBC: 0 % (ref 0.0–0.2)

## 2020-08-15 LAB — BASIC METABOLIC PANEL
Anion gap: 11 (ref 5–15)
BUN: 94 mg/dL — ABNORMAL HIGH (ref 8–23)
CO2: 22 mmol/L (ref 22–32)
Calcium: 9.1 mg/dL (ref 8.9–10.3)
Chloride: 104 mmol/L (ref 98–111)
Creatinine, Ser: 2.49 mg/dL — ABNORMAL HIGH (ref 0.44–1.00)
GFR, Estimated: 20 mL/min — ABNORMAL LOW (ref >60)
Glucose, Bld: 74 mg/dL (ref 70–99)
Potassium: 3.7 mmol/L (ref 3.5–5.1)
Sodium: 137 mmol/L (ref 135–145)

## 2020-08-15 LAB — GLUCOSE, CAPILLARY
Glucose-Capillary: 41 mg/dL — CL (ref 70–99)
Glucose-Capillary: 47 mg/dL — ABNORMAL LOW (ref 70–99)
Glucose-Capillary: 73 mg/dL (ref 70–99)
Glucose-Capillary: 109 mg/dL — ABNORMAL HIGH (ref 70–99)
Glucose-Capillary: 204 mg/dL — ABNORMAL HIGH (ref 70–99)
Glucose-Capillary: 291 mg/dL — ABNORMAL HIGH (ref 70–99)
Glucose-Capillary: 382 mg/dL — ABNORMAL HIGH (ref 70–99)
Glucose-Capillary: 375 mg/dL — ABNORMAL HIGH (ref 70–99)

## 2020-08-15 LAB — MAGNESIUM: Magnesium: 2.1 mg/dL (ref 1.7–2.4)

## 2020-08-15 MED ORDER — SENNOSIDES-DOCUSATE SODIUM 8.6-50 MG PO TABS
1.0000 | ORAL_TABLET | Freq: Two times a day (BID) | ORAL | Status: DC
  Administered 2020-08-15 – 2020-08-19 (×6): 1 via ORAL
  Filled 2020-08-15 (×9): qty 1

## 2020-08-15 MED ORDER — HYDROCORTISONE 1 % EX CREA
TOPICAL_CREAM | Freq: Two times a day (BID) | CUTANEOUS | Status: DC
  Filled 2020-08-15 (×2): qty 28

## 2020-08-15 MED ORDER — HYDROCORTISONE 0.5 % EX CREA
TOPICAL_CREAM | Freq: Two times a day (BID) | CUTANEOUS | Status: DC
  Filled 2020-08-15: qty 28.35

## 2020-08-15 MED ORDER — DEXTROSE 50 % IV SOLN
INTRAVENOUS | Status: AC
  Filled 2020-08-15: qty 50

## 2020-08-15 MED ORDER — GLUCOSE 4 G PO CHEW
CHEWABLE_TABLET | ORAL | Status: AC
  Administered 2020-08-15: 4 g
  Filled 2020-08-15: qty 2

## 2020-08-15 MED ORDER — FUROSEMIDE 10 MG/ML IJ SOLN
80.0000 mg | Freq: Once | INTRAMUSCULAR | Status: AC
  Administered 2020-08-15: 80 mg via INTRAVENOUS
  Filled 2020-08-15: qty 8

## 2020-08-15 MED ORDER — CEPHALEXIN 250 MG PO CAPS
250.0000 mg | ORAL_CAPSULE | Freq: Three times a day (TID) | ORAL | Status: AC
  Administered 2020-08-15 – 2020-08-18 (×9): 250 mg via ORAL
  Filled 2020-08-15 (×9): qty 1

## 2020-08-15 MED ORDER — POLYETHYLENE GLYCOL 3350 17 G PO PACK
17.0000 g | PACK | Freq: Every day | ORAL | Status: DC
  Administered 2020-08-15 – 2020-08-16 (×2): 17 g via ORAL
  Filled 2020-08-15 (×5): qty 1

## 2020-08-15 MED ORDER — HYDROXYZINE HCL 25 MG PO TABS
25.0000 mg | ORAL_TABLET | Freq: Three times a day (TID) | ORAL | Status: DC | PRN
  Administered 2020-08-15 – 2020-08-17 (×4): 25 mg via ORAL
  Filled 2020-08-15 (×5): qty 1

## 2020-08-15 MED ORDER — POTASSIUM CHLORIDE CRYS ER 20 MEQ PO TBCR
20.0000 meq | EXTENDED_RELEASE_TABLET | Freq: Once | ORAL | Status: AC
  Administered 2020-08-15: 20 meq via ORAL
  Filled 2020-08-15: qty 1

## 2020-08-15 MED ORDER — CEPHALEXIN 500 MG PO CAPS: 500.0000 mg | ORAL_CAPSULE | Freq: Two times a day (BID) | ORAL | Status: DC

## 2020-08-15 MED ORDER — INSULIN DETEMIR 100 UNIT/ML ~~LOC~~ SOLN
20.0000 [IU] | Freq: Every day | SUBCUTANEOUS | Status: DC
  Administered 2020-08-15: 20 [IU] via SUBCUTANEOUS
  Filled 2020-08-15 (×2): qty 0.2

## 2020-08-15 NOTE — Progress Notes (Signed)
[facility] Progress Note  Patient: Susan [name]    OYD:[id number]  DOA: 08/13/2020     Date of Service: the patient was seen and examined on 08/15/2020  Brief hospital course: Past medical history of CKD stage IV, chronic diastolic CHF, CAD, right metatarsal amputation.  Presents with complaints of cough and shortness of breath.  Found to have decompensated CHF as well as AKI. Currently plan is monitor recommendation from cardiology.  Assessment and Plan: 1.  Acute on chronic diastolic CHF Appreciate heart failure services assistance. And ICM. Echocardiogram 50 to 60% in the past. Currently continuing diuresis. Volume status improving and renal function remained stable.  Monitor  2.  Acute kidney injury on chronic kidney disease stage IV. Episodes of vomiting at home so may have developed some hemodynamic injury. Currently renal function stable.  Monitor with diuresis.  Avoid medication that can affect kidney functions.  3.  CAD Continue dual antiplatelet therapy.  Monitor.  4.  HTN Blood pressure stable.  Continue Coreg.  5.  Type II DM, uncontrolled with hyperglycemia and hypoglycemia with vascular disease and renal function complication.  With long-term history of insulin use Uses insulin pump at home. Currently insulin pump was discontinued. On sliding scale insulin along with Levemir. Episode of hypoglycemia on 3/22 with therefore dose of Levemir reduced from 40 units to 20 units.  Monitor.  6.  Gram-negative rods in urine culture. UTI. Will initiate 3-day treatment regimen with Keflex.  7. History of right metatarsal amputation No acute issues. Monitor.  8.  Constipation. Treating with bowel regimen.  9.  Itching. Etiology not clear.  Symptomatic treatment for now.  Diet: Renal diet DVT Prophylaxis: Subcutaneous Heparin   heparin injection 5,000 Units Start: 08/14/20 0600    Advance goals of care discussion: Full code  Family Communication: no family  was present at bedside, at the time of interview.   Disposition:  Status is: Inpatient  Remains inpatient appropriate because:IV treatments appropriate due to intensity of illness or inability to take PO   Dispo: The patient is from: Home              Anticipated d/c is to: Home              Patient currently is not medically stable to d/c.   Difficult to place patient No  Subjective: No nausea no vomiting but no fever no chills.  Continues to have some cough.  Had episode of hypoglycemia last night.  Reports constipation.  Physical Exam:  General: Appear in mild distress, no Rash; Oral Mucosa Clear, moist. no Abnormal Neck Mass Or lumps, Conjunctiva normal  Cardiovascular: S1 and S2 Present, no Murmur, Respiratory: good respiratory effort, Bilateral Air entry present and bilateral  Crackles, no wheezes Abdomen: Bowel Sound present, Soft and no tenderness Extremities: trace Pedal edema Neurology: alert and oriented to time, place, and person affect appropriate. no new focal deficit Gait not checked due to patient safety concerns  Vitals:   08/14/20 2108 08/15/20 0530 08/15/20 0951 08/15/20 1626  BP: (!) 116/52 125/80 (!) 158/58 (!) 156/86  Pulse: 72 69 83 85  Resp: 16 17 18 18   Temp: 98.1 F (36.7 C) 98.6 F (37 C) 98 F (36.7 C)   TempSrc: Oral Oral    SpO2: 94% 96% 91% 96%    Intake/Output Summary (Last 24 hours) at 08/15/2020 1829 Last data filed at 08/15/2020 1812 Gross per 24 hour  Intake 1385 ml  Output 3950 ml  Net -2565  ml   There were no vitals filed for this visit.  Data Reviewed: I have personally reviewed and interpreted daily labs, tele strips, imaging. I reviewed all nursing notes, pharmacy notes, vitals, pertinent old records I have discussed plan of care as described above with RN and patient/family.  CBC: Recent Labs  Lab 08/13/20 1242 08/13/20 2029 08/14/20 0500 08/15/20 0219  WBC 4.8 5.8 5.5 5.2  NEUTROABS 3,442  --   --   --   HGB 9.0*  9.4* 9.6* 10.1*  HCT 28.9* 30.8* 31.5* 31.4*  MCV 84.8 86.5 86.3 83.7  PLT 155 167 174 021   Basic Metabolic Panel: Recent Labs  Lab 08/13/20 1242 08/13/20 2029 08/14/20 0500 08/15/20 0219  NA 132* 131* 131* 137  K 5.8* 5.7* 4.4 3.7  CL 100 101 101 104  CO2 18* 19* 18* 22  GLUCOSE 215* 333* 402* 74  BUN 104* 104* 101* 94*  CREATININE 4.17* 3.91* 3.29* 2.49*  CALCIUM 8.6 9.0 8.9 9.1  MG  --   --   --  2.1    Studies: DG Abd Portable 1V  Result Date: 08/15/2020 CLINICAL DATA:  Constipation EXAM: PORTABLE ABDOMEN - 1 VIEW COMPARISON:  07/09/2015 FINDINGS: The right upper quadrant is partially excluded from view. Normal abdominal gas pattern. Moderate stool within the distal colon. No gross free intraperitoneal gas. Endoscopic clip overlies the expected gastric lumen. No abnormal intra-abdominal calcifications identified. Visualized lung bases are clear. No acute bone abnormality. IMPRESSION: Normal abdominal gas pattern. Moderate stool within the distal colon. Electronically Signed   By: Fidela Salisbury MD   On: 08/15/2020 13:47    Scheduled Meds: . allopurinol  100 mg Oral BID  . aspirin EC  81 mg Oral Q breakfast  . carvedilol  12.5 mg Oral BID WC  . cephALEXin  250 mg Oral Q8H  . clopidogrel  75 mg Oral Q breakfast  . ferrous sulfate  325 mg Oral BID WC  . FLUoxetine  10 mg Oral Daily  . heparin  5,000 Units Subcutaneous Q8H  . hydrocortisone cream   Topical BID  . insulin aspart  0-5 Units Subcutaneous QHS  . insulin aspart  0-6 Units Subcutaneous TID WC  . insulin detemir  20 Units Subcutaneous Daily  . levothyroxine  50 mcg Oral Q0600  . polyethylene glycol  17 g Oral Daily  . pregabalin  150 mg Oral BID  . senna-docusate  1 tablet Oral BID  . traZODone  200 mg Oral QHS   Continuous Infusions: PRN Meds: albuterol, hydrALAZINE, hydrOXYzine, morphine injection, nitroGLYCERIN  Time spent: 35 minutes  Author: Berle Mull, MD [facility] 08/15/2020 6:29  PM  To reach On-call, see care teams to locate the attending and reach out via [email or web address]. Between 7PM-7AM, please contact night-coverage If you still have difficulty reaching the attending provider, please page the [facility] (Director on Call) for [facility] on amion for assistance.

## 2020-08-15 NOTE — Progress Notes (Signed)
Hypoglycemic event:  Blood sugar 41 @ 0138, given an OJ 4oz, [place] sandwich, and fig newton bar  Blood sugar 47 @ 0203, given 4g glucose tablet  Blood sugar 73 @ 0220  Blood sugar 109 @ 0306

## 2020-08-15 NOTE — Progress Notes (Signed)
Inpatient Diabetes Program Recommendations  AACE/ADA: New Consensus Statement on Inpatient Glycemic Control (2015)  Target Ranges:  Prepandial:   less than 140 mg/dL      Peak postprandial:   less than 180 mg/dL (1-2 hours)      Critically ill patients:  140 - 180 mg/dL    Results for Susan [name], Susan [name] (MRN [id number]) as of 08/15/2020 10:20  Ref. Range 08/14/2020 07:20 08/14/2020 11:11 08/14/2020 14:54 08/14/2020 17:51 08/14/2020 21:07 08/15/2020 01:37 08/15/2020 02:01 08/15/2020 02:18 08/15/2020 03:05 08/15/2020 06:39  Glucose-Capillary Latest Ref Range: 70 - 99 mg/dL 400 (H) 379 (H) 277 (H) 212 (H) 111 (H) 41 (LL) 47 (L) 73 109 (H) 204 (H)    To ED with Abnormal labs -hemoglobin 9.0, BNP 814, BUN 104, creatinine 4.1, potassium 5.8 AKI on CKD stage IV Acute on chronic diastolic CHF Hypertensive urgency Acute on chronic anemia Mild hyperkalemia  History: DM 2, CHF, CKD  Home DM Meds: NPH Insulin 15 units QAM if CBG >300       NPH Insulin 10 units QPM if CBG >200       Omni Pod Insulin Pump (Total Basal Rate 32.2 units + Uses Set Dose Boluses 8-17 units)      Current Orders: Novolog 0-6 units TID AC + HS          Note hypoglycemia this am, I believe is due to Novolog insulin stacking due to renal function Levemir and Novolog meal coverage d/c'd.   1. Consider restarting Levemir at 30 units (insulin pump gives pt 32.2 units in 24 hour period)  2.  After trends increase consider restarting Novolog meal coverage at 2-3 units tid if eating >50% of meals  --Will follow patient during hospitalization--  Tama Headings RN, MSN, BC-ADM Inpatient Diabetes Coordinator Team Pager [phone] (8a-5p)

## 2020-08-15 NOTE — Progress Notes (Addendum)
Advanced Heart Failure Team Rounding Note   Primary Physician: Susan [name] Primary Cardiologist:  Susan [name]  Reason for Consultation: AKI on CKD 4, Diastolic CHF  HPI:    Had a rough night with severe hypoglycemia.  Good output with IV lasix with improvement in renal fx 4.17 > 3.29 > 2.49.  Improvement in dyspnea.    Objective:    Vital Signs:   Temp:  [97.8 F (36.6 C)-98.6 F (37 C)] 98.6 F (37 C) (03/23 0530) Pulse Rate:  [69-75] 69 (03/23 0530) Resp:  [11-19] 17 (03/23 0530) BP: (116-151)/(52-86) 125/80 (03/23 0530) SpO2:  [91 %-98 %] 96 % (03/23 0530) Last BM Date:  (prior to admission)  Weight change: There were no vitals filed for this visit.  Intake/Output:   Intake/Output Summary (Last 24 hours) at 08/15/2020 0932 Last data filed at 08/15/2020 0700 Gross per 24 hour  Intake 615 ml  Output 2150 ml  Net -1535 ml      Physical Exam    Cardiac: JVD +, normal rate and rhythm, clear s1 and s2, no murmurs, rubs or gallops, Bilateral non pitting LE edema Pulmonary: rales LLL, improvement in RLL rales,  not in distress Abdominal:  soft and nontender Psych: Alert, conversant, in good spirits    Telemetry   NSR 80's-90's  EKG    NSR, rate 85, RBBB  Labs   Basic Metabolic Panel: Recent Labs  Lab 08/13/20 1242 08/13/20 2029 08/14/20 0500 08/15/20 0219  NA 132* 131* 131* 137  K 5.8* 5.7* 4.4 3.7  CL 100 101 101 104  CO2 18* 19* 18* 22  GLUCOSE 215* 333* 402* 74  BUN 104* 104* 101* 94*  CREATININE 4.17* 3.91* 3.29* 2.49*  CALCIUM 8.6 9.0 8.9 9.1  MG  --   --   --  2.1    Liver Function Tests: Recent Labs  Lab 08/13/20 1242  AST 26  ALT 52*  BILITOT 0.8  PROT 5.8*   No results for input(s): LIPASE, AMYLASE in the last 168 hours. No results for input(s): AMMONIA in the last 168 hours.  CBC: Recent Labs  Lab 08/13/20 1242 08/13/20 2029 08/14/20 0500 08/15/20 0219  WBC 4.8 5.8 5.5 5.2  NEUTROABS 3,442  --   --   --    HGB 9.0* 9.4* 9.6* 10.1*  HCT 28.9* 30.8* 31.5* 31.4*  MCV 84.8 86.5 86.3 83.7  PLT 155 167 174 205    Cardiac Enzymes: No results for input(s): CKTOTAL, CKMB, CKMBINDEX, TROPONINI in the last 168 hours.  BNP: BNP (last 3 results) Recent Labs    05/24/20 1755 07/24/20 1430 08/13/20 1242  BNP 886.0* 471.7* 814*    ProBNP (last 3 results) No results for input(s): PROBNP in the last 8760 hours.   CBG: Recent Labs  Lab 08/15/20 0137 08/15/20 0201 08/15/20 0218 08/15/20 0305 08/15/20 0639  GLUCAP 41* 47* 73 109* 204*    Coagulation Studies: No results for input(s): LABPROT, INR in the last 72 hours.   Imaging   No results found.   Medications:     Current Medications: . allopurinol  100 mg Oral BID  . aspirin EC  81 mg Oral Q breakfast  . carvedilol  12.5 mg Oral BID WC  . clopidogrel  75 mg Oral Q breakfast  . dextrose      . ferrous sulfate  325 mg Oral BID WC  . FLUoxetine  10 mg Oral Daily  . heparin  5,000 Units  Subcutaneous Q8H  . insulin aspart  0-5 Units Subcutaneous QHS  . insulin aspart  0-6 Units Subcutaneous TID WC  . levothyroxine  50 mcg Oral Q0600  . pregabalin  150 mg Oral BID  . traZODone  200 mg Oral QHS    Infusions:    Assessment/Plan   Acute on Chronic Diastolic Heart Failure:  - NICM, Echo 06/2017: EF 55-60%, grade 1 DD - Echo 03/19/18: EF 55-60%, grade 1 DD - Echo 01/2020 EF 55-60%. RV normal  - worsened in the setting of continued increased fluid intake and poor dietary choices, likely also contribution from worsening renal function in the setting of ATN -volume status improving, continue diuresis    AKI on CKD Stage IV - Based on history may have developed ATN from severe vomiting while taking diuretics/antihypertensives, possible bp dropped acutely in the setting of acute illness and developed ATN which worsened volume status further -monitor renal fx closely with diuresis  CAD: history of CAD with BMS in 2007.  Low  risk nuc study in 2018 - No chest pain.  - Continue DAPT w/ ASA + Plavix.  - Continue ? blocker therapy. - Off statin due to previously elevated LFTs.    HTN:  - only on carvedilol 12.5mg  BID usually controlled but now up likely due to excess volume   Uncontrolled DM:  -A1C now back up to 11 -Follows closely with Dr. Bubba Camp. Has an insulin pump -hypoglycemic overnight  -insulin dosing here per primary, not a candidate for SGLT2i  S/p right transmetatarsal amputation - Followed by Dr Sharol Given  UTI: -management per primary   Length of Stay: 1  Susan Roan, MD  08/15/2020, 9:32 AM  Advanced Heart Failure Team Pager [phone] (M-F; [place])  Please contact [facility] for night-coverage after hours (4p -7a ) and weekends on [email or web address]   Patient seen and examined with the above-signed Advanced Practice Provider and/or Housestaff. I personally reviewed laboratory data, imaging studies and relevant notes. I independently examined the patient and formulated the important aspects of the plan. I have edited the note to reflect any of my changes or salient points. I have personally discussed the plan with the patient and/or family.  Hypoglycemic overnight. Improved today. Diuresing well with IV lasix,. Complains she can'[name] get enough to drink from nurses. Denies SOB, orthopnea or PND. Creatinine improving.   General:  Lying in bed  No resp difficulty HEENT: normal Neck: supple.JVO to jaw  Carotids 2+ bilat; no bruits. No lymphadenopathy or thryomegaly appreciated. Cor: PMI nondisplaced. Regular rate & rhythm. No rubs, gallops or murmurs. Lungs: clear Abdomen: obese soft, nontender, nondistended. No hepatosplenomegaly. No bruits or masses. Good bowel sounds. Extremities: no cyanosis, clubbing, rash,1-2+ edema Neuro: alert & orientedx3, cranial nerves grossly intact. moves all 4 extremities w/o difficulty. Affect pleasant  Diuresing well but wants to drink more. Creatinine improving.  BP stable. Continue IV diuresis. Discussed need to limit salt and fluid intake here and at home.   Susan Bickers, MD  10:17 PM

## 2020-08-16 DIAGNOSIS — I5033 Acute on chronic diastolic (congestive) heart failure: Secondary | ICD-10-CM | POA: Diagnosis not present

## 2020-08-16 DIAGNOSIS — I2510 Atherosclerotic heart disease of native coronary artery without angina pectoris: Secondary | ICD-10-CM | POA: Diagnosis not present

## 2020-08-16 DIAGNOSIS — N179 Acute kidney failure, unspecified: Secondary | ICD-10-CM | POA: Diagnosis not present

## 2020-08-16 DIAGNOSIS — N184 Chronic kidney disease, stage 4 (severe): Secondary | ICD-10-CM | POA: Diagnosis not present

## 2020-08-16 LAB — BASIC METABOLIC PANEL
Anion gap: 7 (ref 5–15)
BUN: 79 mg/dL — ABNORMAL HIGH (ref 8–23)
CO2: 26 mmol/L (ref 22–32)
Calcium: 8.9 mg/dL (ref 8.9–10.3)
Chloride: 104 mmol/L (ref 98–111)
Creatinine, Ser: 2.22 mg/dL — ABNORMAL HIGH (ref 0.44–1.00)
GFR, Estimated: 23 mL/min — ABNORMAL LOW (ref >60)
Glucose, Bld: 301 mg/dL — ABNORMAL HIGH (ref 70–99)
Potassium: 4.2 mmol/L (ref 3.5–5.1)
Sodium: 137 mmol/L (ref 135–145)

## 2020-08-16 LAB — GLUCOSE, CAPILLARY
Glucose-Capillary: 274 mg/dL — ABNORMAL HIGH (ref 70–99)
Glucose-Capillary: 283 mg/dL — ABNORMAL HIGH (ref 70–99)
Glucose-Capillary: 324 mg/dL — ABNORMAL HIGH (ref 70–99)
Glucose-Capillary: 328 mg/dL — ABNORMAL HIGH (ref 70–99)
Glucose-Capillary: 335 mg/dL — ABNORMAL HIGH (ref 70–99)

## 2020-08-16 LAB — URINE CULTURE: Culture: >=100000 — AB

## 2020-08-16 LAB — MAGNESIUM: Magnesium: 2.1 mg/dL (ref 1.7–2.4)

## 2020-08-16 MED ORDER — FUROSEMIDE 10 MG/ML IJ SOLN
60.0000 mg | Freq: Once | INTRAMUSCULAR | Status: AC
  Administered 2020-08-16: 60 mg via INTRAVENOUS
  Filled 2020-08-16: qty 6

## 2020-08-16 MED ORDER — TRAZODONE HCL 50 MG PO TABS
150.0000 mg | ORAL_TABLET | Freq: Every evening | ORAL | Status: DC | PRN
  Filled 2020-08-16: qty 1

## 2020-08-16 MED ORDER — PREGABALIN 100 MG PO CAPS: 100.0000 mg | ORAL_CAPSULE | Freq: Two times a day (BID) | ORAL | Status: DC

## 2020-08-16 MED ORDER — PREGABALIN 75 MG PO CAPS
150.0000 mg | ORAL_CAPSULE | Freq: Two times a day (BID) | ORAL | Status: DC
  Administered 2020-08-16 – 2020-08-19 (×7): 150 mg via ORAL
  Filled 2020-08-16: qty 6
  Filled 2020-08-16 (×5): qty 2

## 2020-08-16 MED ORDER — INSULIN DETEMIR 100 UNIT/ML ~~LOC~~ SOLN
30.0000 [IU] | Freq: Every day | SUBCUTANEOUS | Status: DC
  Administered 2020-08-16 – 2020-08-19 (×4): 30 [IU] via SUBCUTANEOUS
  Filled 2020-08-16 (×4): qty 0.3

## 2020-08-16 NOTE — Progress Notes (Addendum)
Advanced Heart Failure Team Rounding Note   Primary Physician: [name] T Primary Cardiologist:  Beverley Fiedler  Reason for Consultation: AKI on CKD 4, Diastolic CHF  HPI:    Another day of good output with IV lasix with improvement in renal fx 4.17 > 3.29 > 2.49> 2.22.  Weight down about 11lbs.   Still gets short of breath but has improved.    Objective:    Vital Signs:   Temp:  [97.7 F (36.5 C)-98 F (36.7 C)] 97.7 F (36.5 C) (03/24 0543) Pulse Rate:  [77-85] 77 (03/24 0543) Resp:  [16-18] 16 (03/24 0543) BP: (154-158)/(56-86) 156/56 (03/24 0543) SpO2:  [91 %-96 %] 93 % (03/24 0543) Weight:  [116.9 kg-122.2 kg] 116.9 kg (03/24 0543) Last BM Date:  (prior to admission)  Weight change: [facility] Weights   08/15/20 2109 08/16/20 0543  Weight: 122.2 kg 116.9 kg    Intake/Output:   Intake/Output Summary (Last 24 hours) at 08/16/2020 0747 Last data filed at 08/16/2020 0200 Gross per 24 hour  Intake 1120 ml  Output 5050 ml  Net -3930 ml      Physical Exam    Cardiac: JVD +, normal rate and rhythm, clear s1 and s2, no murmurs, rubs or gallops, bilateral non pitting LE edema Pulmonary:decreased breath sounds LLL , not in distress Abdominal: distended, soft and nontender Psych: Alert, conversant, in good spirits  Telemetry   NSR 70's-80s  EKG    No new ecg  Labs   Basic Metabolic Panel: Recent Labs  Lab 08/13/20 1242 08/13/20 2029 08/14/20 0500 08/15/20 0219 08/16/20 0428  NA 132* 131* 131* 137 137  K 5.8* 5.7* 4.4 3.7 4.2  CL 100 101 101 104 104  CO2 18* 19* 18* 22 26  GLUCOSE 215* 333* 402* 74 301*  BUN 104* 104* 101* 94* 79*  CREATININE 4.17* 3.91* 3.29* 2.49* 2.22*  CALCIUM 8.6 9.0 8.9 9.1 8.9  MG  --   --   --  2.1 2.1    Liver Function Tests: Recent Labs  Lab 08/13/20 1242  AST 26  ALT 52*  BILITOT 0.8  PROT 5.8*   No results for input(s): LIPASE, AMYLASE in the last 168 hours. No results for input(s): AMMONIA in the last  168 hours.  CBC: Recent Labs  Lab 08/13/20 1242 08/13/20 2029 08/14/20 0500 08/15/20 0219  WBC 4.8 5.8 5.5 5.2  NEUTROABS 3,442  --   --   --   HGB 9.0* 9.4* 9.6* 10.1*  HCT 28.9* 30.8* 31.5* 31.4*  MCV 84.8 86.5 86.3 83.7  PLT 155 167 174 205    Cardiac Enzymes: No results for input(s): CKTOTAL, CKMB, CKMBINDEX, TROPONINI in the last 168 hours.  BNP: BNP (last 3 results) Recent Labs    05/24/20 1755 07/24/20 1430 08/13/20 1242  BNP 886.0* 471.7* 814*    ProBNP (last 3 results) No results for input(s): PROBNP in the last 8760 hours.   CBG: Recent Labs  Lab 08/15/20 0639 08/15/20 1124 08/15/20 1625 08/15/20 2110 08/16/20 0057  GLUCAP 204* 291* 382* 375* 335*    Coagulation Studies: No results for input(s): LABPROT, INR in the last 72 hours.   Imaging   DG Abd Portable 1V  Result Date: 08/15/2020 CLINICAL DATA:  Constipation EXAM: PORTABLE ABDOMEN - 1 VIEW COMPARISON:  07/09/2015 FINDINGS: The right upper quadrant is partially excluded from view. Normal abdominal gas pattern. Moderate stool within the distal colon. No gross free intraperitoneal gas. Endoscopic clip overlies  the expected gastric lumen. No abnormal intra-abdominal calcifications identified. Visualized lung bases are clear. No acute bone abnormality. IMPRESSION: Normal abdominal gas pattern. Moderate stool within the distal colon. Electronically Signed   By: Fidela Salisbury MD   On: 08/15/2020 13:47     Medications:     Current Medications: . allopurinol  100 mg Oral BID  . aspirin EC  81 mg Oral Q breakfast  . carvedilol  12.5 mg Oral BID WC  . cephALEXin  250 mg Oral Q8H  . clopidogrel  75 mg Oral Q breakfast  . ferrous sulfate  325 mg Oral BID WC  . FLUoxetine  10 mg Oral Daily  . heparin  5,000 Units Subcutaneous Q8H  . hydrocortisone cream   Topical BID  . insulin aspart  0-5 Units Subcutaneous QHS  . insulin aspart  0-6 Units Subcutaneous TID WC  . insulin detemir  20 Units  Subcutaneous Daily  . levothyroxine  50 mcg Oral Q0600  . polyethylene glycol  17 g Oral Daily  . pregabalin  150 mg Oral BID  . senna-docusate  1 tablet Oral BID  . traZODone  200 mg Oral QHS    Infusions:    Assessment/Plan   Acute on Chronic Diastolic Heart Failure:  - NICM, Echo 06/2017: EF 55-60%, grade 1 DD - Echo 03/19/18: EF 55-60%, grade 1 DD - Echo 01/2020 EF 55-60%. RV normal  - worsened in the setting of continued increased fluid intake and poor dietary choices, likely also contribution from worsening renal function in the setting of ATN - volume status improving, her weight is lower than it has been in a while but was 112kg back in December. Still appears to be overloaded, continue diuresis    AKI on CKD Stage IV - Based on history may have developed ATN from severe vomiting while taking diuretics/antihypertensives, possible bp dropped acutely in the setting of acute illness and developed ATN which worsened volume status further -Continues to improve, we are likely close to her baseline Cr level -monitor renal fx closely with diuresis  CAD: history of CAD with BMS in 2007.  Low risk nuc study in 2018 - No chest pain.  - Continue DAPT w/ ASA + Plavix.  - Continue ? blocker therapy. - Off statin due to previously elevated LFTs.    HTN:  - only on carvedilol 12.5mg  BID usually controlled but now up likely due to excess volume   Uncontrolled DM:  -A1C now back up to 11 -Follows closely with Dr. Bubba Camp. Has an insulin pump -insulin dosing here per primary, not a candidate for SGLT2i  S/p right transmetatarsal amputation - Followed by Dr Sharol Given  UTI: -management per primary   Deconditioning: -not wanting to get up or move around  -ordered PT/OT  Length of Stay: 2  Katherine Roan, MD  08/16/2020, 7:47 AM   Advanced Heart Failure Team Pager [phone] (M-F; 7a - 4p)  Please contact [facility] for night-coverage after hours (4p -7a ) and weekends on  [email or web address]  Patient seen and examined with the above-signed Advanced Practice Provider and/or Housestaff. I personally reviewed laboratory data, imaging studies and relevant notes. I independently examined the patient and formulated the important aspects of the plan. I have edited the note to reflect any of my changes or salient points. I have personally discussed the plan with the patient and/or family.  Volume status improving. Creatinine improving. Still feels weak and somewhat SOB. Not wanting to ambualte.   General:  Lying in bed No resp difficulty HEENT: normal Neck: supple. JVP hard to see. Looks mildly elevated Carotids 2+ bilat; no bruits. No lymphadenopathy or thryomegaly appreciated. Cor: PMI nondisplaced. Regular rate & rhythm. No rubs, gallops or murmurs. Lungs: clear Abdomen: obese soft, nontender, nondistended. No hepatosplenomegaly. No bruits or masses. Good bowel sounds. Extremities: no cyanosis, clubbing, rash, edema Neuro: alert & orientedx3, cranial nerves grossly intact. moves all 4 extremities w/o difficulty. Affect pleasant  Remains mildly volume overloaded. Will continue IV diuresis one more day then switch to po. Agree with PT/OT consult. Possibly home tomorrow from our standpoint.   Glori Bickers, MD  2:07 PM

## 2020-08-16 NOTE — Progress Notes (Signed)
[facility] Progress Note  Patient: Susan [name]    XBL:[id number]  DOA: 08/13/2020     Date of Service: the patient was seen and examined on 08/16/2020  Brief hospital course: Past medical history of CKD stage IV, chronic diastolic CHF, CAD, right metatarsal amputation.  Presents with complaints of cough and shortness of breath.  Found to have decompensated CHF as well as AKI. Currently plan is continue diuresis per cardiology guidance..  Assessment and Plan: 1.  Acute on chronic diastolic CHF Appreciate heart failure services assistance. NICM. Echocardiogram 50 to 60% in the past. Currently continuing diuresis. Volume status improving and renal function remained stable.  Monitor  2.  Acute kidney injury on chronic kidney disease stage IV. Baseline serum creatinine around 2.0. Reported episodes of vomiting at home so may have developed some hemodynamic injury. On presentation serum creatinine 4.17.  Cardiorenal hemodynamics. Currently renal function stable.  Monitor with diuresis.  Avoid medication that can affect kidney functions.  3.  CAD Continue dual antiplatelet therapy.  Monitor.  4.  HTN Blood pressure stable.  Continue Coreg.  5.  Type II DM, uncontrolled with hyperglycemia and hypoglycemia with vascular disease and renal function complication.  With long-term history of insulin use Uses insulin pump at home. Currently insulin pump was discontinued. On sliding scale insulin along with Levemir. Episode of hypoglycemia on 3/22 with therefore dose of Levemir reduced from 40 units to 20 units.   Given that blood sugars remains elevated.  We will increase Levemir dose from 20 units to 30 units.  6.    E. Coli UTI. Urine culture positive for E. coli.  Pansensitive. Will initiate 3-day treatment regimen with Keflex.  7. History of right metatarsal amputation No acute issues. Monitor.  8.  Constipation. Treating with bowel regimen.  9.  Itching. Etiology  not clear.  Symptomatic treatment for now.  10.  Morbid obesity. Placing the patient at high risk of poor outcome. Suspect the patient is also suffering from undiagnosed OSA and may benefit from outpatient sleep study. Body mass index is 41.6 kg/m.   Diet: Cardiac diet DVT Prophylaxis:   heparin injection 5,000 Units Start: 08/14/20 0600   Advance goals of care discussion: Full code  Family Communication: no family was present at bedside, at the time of interview.   Disposition:  Status is: Inpatient  Remains inpatient appropriate because: Continuing diuresis IV  Dispo: The patient is from: Home              Anticipated d/c is to: Home              Patient currently is not medically stable to d/c.   Difficult to place patient    Subjective: No nausea no vomiting but no fever no chills.  Sleepy today.  She thinks that this is secondary to trazodone which she only takes as needed and currently being ordered scheduled.  Physical Exam:  General: Appear in mild distress, no Rash; Oral Mucosa Clear, moist. no Abnormal Neck Mass Or lumps, Conjunctiva normal  Cardiovascular: S1 and S2 Present, no Murmur, Respiratory: increased respiratory effort, Bilateral Air entry present and bilateral  Crackles, no wheezes Abdomen: Bowel Sound present, Soft and no tenderness Extremities: bilateral  Pedal edema Neurology: alert and oriented to time, place, and person affect appropriate. no new focal deficit Gait not checked due to patient safety concerns  Vitals:   08/15/20 2109 08/16/20 0543 08/16/20 0838 08/16/20 1704  BP: (!) 154/67 (!) 156/56 (!) 158/65 Marland Kitchen)  151/78  Pulse: 81 77 88 91  Resp: 16 16 14 18   Temp: 98 F (36.7 C) 97.7 F (36.5 C) 98.4 F (36.9 C) 98 F (36.7 C)  TempSrc: Oral Oral Oral   SpO2: 95% 93% 93% 93%  Weight: 122.2 kg 116.9 kg      Intake/Output Summary (Last 24 hours) at 08/16/2020 1853 Last data filed at 08/16/2020 1708 Gross per 24 hour  Intake 340 ml   Output 2650 ml  Net -2310 ml   Filed Weights   08/15/20 2109 08/16/20 0543  Weight: 122.2 kg 116.9 kg    Data Reviewed: I have personally reviewed and interpreted daily labs, tele strips, imaging. I reviewed all nursing notes, pharmacy notes, vitals, pertinent old records I have discussed plan of care as described above with RN and patient/family.  CBC: Recent Labs  Lab 08/13/20 1242 08/13/20 2029 08/14/20 0500 08/15/20 0219  WBC 4.8 5.8 5.5 5.2  NEUTROABS 3,442  --   --   --   HGB 9.0* 9.4* 9.6* 10.1*  HCT 28.9* 30.8* 31.5* 31.4*  MCV 84.8 86.5 86.3 83.7  PLT 155 167 174 956   Basic Metabolic Panel: Recent Labs  Lab 08/13/20 1242 08/13/20 2029 08/14/20 0500 08/15/20 0219 08/16/20 0428  NA 132* 131* 131* 137 137  K 5.8* 5.7* 4.4 3.7 4.2  CL 100 101 101 104 104  CO2 18* 19* 18* 22 26  GLUCOSE 215* 333* 402* 74 301*  BUN 104* 104* 101* 94* 79*  CREATININE 4.17* 3.91* 3.29* 2.49* 2.22*  CALCIUM 8.6 9.0 8.9 9.1 8.9  MG  --   --   --  2.1 2.1    Studies: No results found.  Scheduled Meds: . allopurinol  100 mg Oral BID  . aspirin EC  81 mg Oral Q breakfast  . carvedilol  12.5 mg Oral BID WC  . cephALEXin  250 mg Oral Q8H  . clopidogrel  75 mg Oral Q breakfast  . ferrous sulfate  325 mg Oral BID WC  . FLUoxetine  10 mg Oral Daily  . heparin  5,000 Units Subcutaneous Q8H  . hydrocortisone cream   Topical BID  . insulin aspart  0-5 Units Subcutaneous QHS  . insulin aspart  0-6 Units Subcutaneous TID WC  . insulin detemir  30 Units Subcutaneous Daily  . levothyroxine  50 mcg Oral Q0600  . polyethylene glycol  17 g Oral Daily  . pregabalin  150 mg Oral BID  . senna-docusate  1 tablet Oral BID   Continuous Infusions: PRN Meds: albuterol, hydrALAZINE, hydrOXYzine, morphine injection, nitroGLYCERIN, traZODone  Time spent: 35 minutes  Author: Berle Mull, MD [facility] 08/16/2020 6:53 PM  To reach On-call, see care teams to locate the attending  and reach out via [email or web address]. Between 7PM-7AM, please contact night-coverage If you still have difficulty reaching the attending provider, please page the [facility] (Director on Call) for [facility] on amion for assistance.

## 2020-08-16 NOTE — Evaluation (Signed)
Physical Therapy Evaluation Patient Details Name: Susan [name] MRN: [id number] DOB: 19-Dec-1950 Today's Date: 08/16/2020   History of Present Illness  Susan [name] is a 70 y/o female who reported to ED after abnormal lab results at an appointment with her PCP. Hg = 9, BNP 814, BUN 104. Pt had complaints of 11lb weight gain in 1 week, dyspnea, intermittent chest pain, and brown urine. Pt was admitted on 08/13/20 and diagnosed with AKI and acute on chronic CHF. PMH includes HTN, DM, HLD, CKD, RBBB, CAD, rotator cuff tear in 2015, R transmetatarsal amputation, and Covid.    Clinical Impression  Pt received in bed, cooperative and pleasant. Supervision for bed mobility, but min A for transfers for steadying. Cueing for hand placement during transfers. Pt verbally recognized at end of session she needs to improve her hand placement. VSS on RA, but pt fatigued following stand pivot with RW to [facility]. Also reported significant dizziness following supine to sit, instantly reached for railings and support. Dizziness subsided after sitting on EOB, but plan to complete vestibular screen at next visit. Pt would benefit from PT to address strength, endurance, balance, and decrease risk for falls. Left in bed with all needs met, call bell within reach, bed alarm active, and NT present in room. Will continue to follow acutely.    Follow Up Recommendations Home health PT;Supervision for mobility/OOB;Other (comment) (Politely declined SNF)    Equipment Recommendations  3in1 (PT)    Recommendations for Other Services       Precautions / Restrictions Precautions Precautions: Fall Restrictions Weight Bearing Restrictions: No      Mobility  Bed Mobility Overal bed mobility: Needs Assistance Bed Mobility: Supine to Sit;Sit to Supine     Supine to sit: Supervision Sit to supine: Supervision   General bed mobility comments: Use of bed rail but no physical assist given    Transfers Overall transfer level: Needs  assistance Equipment used: Rolling walker (2 wheeled) Transfers: Sit to/from [facility] Sit to Stand: Min assist Stand pivot transfers: Min assist       General transfer comment: Min A for steadying, cueing for hand placement  Ambulation/Gait             General Gait Details: deferred due to fatigue  Stairs            Wheelchair Mobility    Modified Rankin (Stroke Patients Only)       Balance Overall balance assessment: Needs assistance   Sitting balance-Leahy Scale: Fair       Standing balance-Leahy Scale: Poor Standing balance comment: Reliant on UE support                             Pertinent Vitals/Pain Pain Assessment: No/denies pain    Home Living Family/patient expects to be discharged to:: Private residence Living Arrangements: Spouse/significant other Available Help at Discharge: Family;Available PRN/intermittently;Other (Comment) (husband available in evening, son available during day but son has some disabilities, can help with less exertional tasks) Type of Home: Mobile home Home Access: Ramped entrance     Home Layout: One level Home Equipment: Grab bars - tub/shower;Walker - 2 wheels;Walker - 4 wheels;Bedside commode;Cane - quad Additional Comments: Uses RW inside for household ambulation and cane in community    Prior Function Level of Independence: Independent with assistive device(s)               Hand Dominance   Dominant Hand: Right  Extremity/Trunk Assessment   Upper Extremity Assessment Upper Extremity Assessment: Overall WFL for tasks assessed    Lower Extremity Assessment Lower Extremity Assessment: Overall WFL for tasks assessed    Cervical / Trunk Assessment Cervical / Trunk Assessment: Kyphotic  Communication   Communication: No difficulties  Cognition Arousal/Alertness: Awake/alert Behavior During Therapy: WFL for tasks assessed/performed Overall Cognitive Status: Within  Functional Limits for tasks assessed                                 General Comments: A&Ox4, very polite      General Comments General comments (skin integrity, edema, etc.): VSS on RA. Pt did report significant dizziness following supine to sit, sat at EOB for ~2 min before dizziness went away. Pt unsteady upon standing but did not report any dizziness. Denied history of vertigo.    Exercises     Assessment/Plan    PT Assessment Patient needs continued PT services  PT Problem List Decreased strength;Decreased knowledge of use of DME;Decreased activity tolerance;Decreased safety awareness;Decreased balance;Decreased mobility;Decreased coordination       PT Treatment Interventions DME instruction;Balance training;Gait training;Neuromuscular re-education;Stair training;Functional mobility training;Patient/family education;Therapeutic activities;Therapeutic exercise    PT Goals (Current goals can be found in the Care Plan section)  Acute Rehab PT Goals Patient Stated Goal: go home, work on balance PT Goal Formulation: With patient Time For Goal Achievement: 08/30/20 Potential to Achieve Goals: Good    Frequency Min 3X/week   Barriers to discharge        Co-evaluation               AM-PAC PT "6 Clicks" Mobility  Outcome Measure Help needed turning from your back to your side while in a flat bed without using bedrails?: A Little Help needed moving from lying on your back to sitting on the side of a flat bed without using bedrails?: A Little Help needed moving to and from a bed to a chair (including a wheelchair)?: A Little Help needed standing up from a chair using your arms (e.g., wheelchair or bedside chair)?: A Little Help needed to walk in hospital room?: A Lot Help needed climbing 3-5 steps with a railing? : A Lot 6 Click Score: 16    End of Session Equipment Utilized During Treatment: Gait belt Activity Tolerance: Patient limited by  fatigue Patient left: in bed;with call bell/phone within reach;with bed alarm set Nurse Communication: Mobility status PT Visit Diagnosis: Unsteadiness on feet (R26.81);Muscle weakness (generalized) (M62.81)    Time:  -      Charges:         Rosita Kea, SPT

## 2020-08-17 DIAGNOSIS — N184 Chronic kidney disease, stage 4 (severe): Secondary | ICD-10-CM | POA: Diagnosis not present

## 2020-08-17 DIAGNOSIS — I5033 Acute on chronic diastolic (congestive) heart failure: Secondary | ICD-10-CM | POA: Diagnosis not present

## 2020-08-17 DIAGNOSIS — I2510 Atherosclerotic heart disease of native coronary artery without angina pectoris: Secondary | ICD-10-CM | POA: Diagnosis not present

## 2020-08-17 DIAGNOSIS — N179 Acute kidney failure, unspecified: Secondary | ICD-10-CM | POA: Diagnosis not present

## 2020-08-17 LAB — GLUCOSE, CAPILLARY
Glucose-Capillary: 169 mg/dL — ABNORMAL HIGH (ref 70–99)
Glucose-Capillary: 229 mg/dL — ABNORMAL HIGH (ref 70–99)
Glucose-Capillary: 226 mg/dL — ABNORMAL HIGH (ref 70–99)
Glucose-Capillary: 193 mg/dL — ABNORMAL HIGH (ref 70–99)

## 2020-08-17 LAB — BASIC METABOLIC PANEL
Anion gap: 9 (ref 5–15)
BUN: 69 mg/dL — ABNORMAL HIGH (ref 8–23)
CO2: 28 mmol/L (ref 22–32)
Calcium: 8.9 mg/dL (ref 8.9–10.3)
Chloride: 103 mmol/L (ref 98–111)
Creatinine, Ser: 1.89 mg/dL — ABNORMAL HIGH (ref 0.44–1.00)
GFR, Estimated: 28 mL/min — ABNORMAL LOW (ref >60)
Glucose, Bld: 174 mg/dL — ABNORMAL HIGH (ref 70–99)
Potassium: 4.3 mmol/L (ref 3.5–5.1)
Sodium: 140 mmol/L (ref 135–145)

## 2020-08-17 MED ORDER — INSULIN ASPART 100 UNIT/ML ~~LOC~~ SOLN
6.0000 [IU] | Freq: Three times a day (TID) | SUBCUTANEOUS | Status: DC
  Administered 2020-08-17 – 2020-08-19 (×7): 6 [IU] via SUBCUTANEOUS

## 2020-08-17 MED ORDER — FUROSEMIDE 10 MG/ML IJ SOLN
60.0000 mg | Freq: Once | INTRAMUSCULAR | Status: AC
  Administered 2020-08-17: 60 mg via INTRAVENOUS
  Filled 2020-08-17: qty 6

## 2020-08-17 MED ORDER — HYDROCERIN EX CREA
TOPICAL_CREAM | Freq: Two times a day (BID) | CUTANEOUS | Status: DC
  Filled 2020-08-17: qty 113

## 2020-08-17 NOTE — Progress Notes (Addendum)
Advanced Heart Failure Team Rounding Note   Primary Physician: [name] T Primary Cardiologist:  Beverley Fiedler  Reason for Consultation: AKI on CKD 4, Diastolic CHF  HPI:    Continued on IV lasix yesterday with improvement in renal fx 4.17 > 3.29 > 2.49> 2.22>1.89.  Weight down about 5 more lbs.   Shortness of breath improving.    Objective:    Vital Signs:   Temp:  [98 F (36.7 C)-98.4 F (36.9 C)] 98.1 F (36.7 C) (03/25 0615) Pulse Rate:  [74-91] 74 (03/25 0615) Resp:  [14-18] 18 (03/25 0615) BP: (127-158)/(58-78) 127/58 (03/25 0615) SpO2:  [90 %-95 %] 95 % (03/25 0615) Weight:  [114.5 kg] 114.5 kg (03/25 0615) Last BM Date:  (prior to admission)  Weight change: Filed Weights   08/15/20 2109 08/16/20 0543 08/17/20 0615  Weight: 122.2 kg 116.9 kg 114.5 kg    Intake/Output:   Intake/Output Summary (Last 24 hours) at 08/17/2020 0801 Last data filed at 08/17/2020 0645 Gross per 24 hour  Intake 360 ml  Output 2450 ml  Net -2090 ml      Physical Exam    Cardiac: JVD +, normal rate and rhythm, clear s1 and s2, no murmurs, rubs or gallops, bilateral non pitting LE edema Pulmonary:LLL rales, not in distress Abdominal: less distended today, soft and nontender Psych: Alert, conversant, in good spirits  Telemetry   NSR 70's-80s  EKG    No new ecg  Labs   Basic Metabolic Panel: Recent Labs  Lab 08/13/20 2029 08/14/20 0500 08/15/20 0219 08/16/20 0428 08/17/20 0532  NA 131* 131* 137 137 140  K 5.7* 4.4 3.7 4.2 4.3  CL 101 101 104 104 103  CO2 19* 18* 22 26 28   GLUCOSE 333* 402* 74 301* 174*  BUN 104* 101* 94* 79* 69*  CREATININE 3.91* 3.29* 2.49* 2.22* 1.89*  CALCIUM 9.0 8.9 9.1 8.9 8.9  MG  --   --  2.1 2.1  --     Liver Function Tests: Recent Labs  Lab 08/13/20 1242  AST 26  ALT 52*  BILITOT 0.8  PROT 5.8*   No results for input(s): LIPASE, AMYLASE in the last 168 hours. No results for input(s): AMMONIA in the last 168  hours.  CBC: Recent Labs  Lab 08/13/20 1242 08/13/20 2029 08/14/20 0500 08/15/20 0219  WBC 4.8 5.8 5.5 5.2  NEUTROABS 3,442  --   --   --   HGB 9.0* 9.4* 9.6* 10.1*  HCT 28.9* 30.8* 31.5* 31.4*  MCV 84.8 86.5 86.3 83.7  PLT 155 167 174 205    Cardiac Enzymes: No results for input(s): CKTOTAL, CKMB, CKMBINDEX, TROPONINI in the last 168 hours.  BNP: BNP (last 3 results) Recent Labs    05/24/20 1755 07/24/20 1430 08/13/20 1242  BNP 886.0* 471.7* 814*    ProBNP (last 3 results) No results for input(s): PROBNP in the last 8760 hours.   CBG: Recent Labs  Lab 08/16/20 0634 08/16/20 1147 08/16/20 1703 08/16/20 2143 08/17/20 0654  GLUCAP 274* 328* 283* 324* 169*    Coagulation Studies: No results for input(s): LABPROT, INR in the last 72 hours.   Imaging   No results found.   Medications:     Current Medications: . allopurinol  100 mg Oral BID  . aspirin EC  81 mg Oral Q breakfast  . carvedilol  12.5 mg Oral BID WC  . cephALEXin  250 mg Oral Q8H  . clopidogrel  75 mg  Oral Q breakfast  . ferrous sulfate  325 mg Oral BID WC  . FLUoxetine  10 mg Oral Daily  . heparin  5,000 Units Subcutaneous Q8H  . hydrocerin   Topical BID  . hydrocortisone cream   Topical BID  . insulin aspart  0-5 Units Subcutaneous QHS  . insulin aspart  0-6 Units Subcutaneous TID WC  . insulin aspart  6 Units Subcutaneous TID WC  . insulin detemir  30 Units Subcutaneous Daily  . levothyroxine  50 mcg Oral Q0600  . polyethylene glycol  17 g Oral Daily  . pregabalin  150 mg Oral BID  . senna-docusate  1 tablet Oral BID    Infusions:    Assessment/Plan   Acute on Chronic Diastolic Heart Failure:  - NICM, Echo 06/2017: EF 55-60%, grade 1 DD - Echo 03/19/18: EF 55-60%, grade 1 DD - Echo 01/2020 EF 55-60%. RV normal  - worsened in the setting of continued increased fluid intake and poor dietary choices, likely also contribution from worsening renal function in the setting of  ATN - volume status improving, her weight is lower than it has been in a while at 114kg was 112kg back in December. Continue IV diuresis can likely transition to oral tomorrow   AKI on CKD Stage IV - Based on history may have developed ATN from severe vomiting while taking diuretics/antihypertensives, possible bp dropped acutely in the setting of acute illness and developed ATN which worsened volume status further -Continues to improve, we are at or better than her baseline Cr level today at 1.89 -monitor renal fx closely with diuresis  CAD: history of CAD with BMS in 2007.  Low risk nuc study in 2018 - No chest pain.  - Continue DAPT w/ ASA + Plavix.  - Continue ? blocker therapy. - Off statin due to previously elevated LFTs.    HTN:  - only on carvedilol 12.5mg  BID usually controlled but now up likely due to excess volume   Uncontrolled DM:  -A1C now back up to 11 -Follows closely with Dr. Bubba Camp. Has an insulin pump -insulin dosing here per primary, not a candidate for SGLT2i  S/p right transmetatarsal amputation - Followed by Dr Sharol Given  UTI: -management per primary   Deconditioning: -says her son can help her Th-Su, she has refused SNF -continue PT/OT  Length of Stay: 3  Katherine Roan, MD  08/17/2020, 8:01 AM   Advanced Heart Failure Team Pager [phone] (M-F; 7a - 4p)  Please contact [facility] for night-coverage after hours (4p -7a ) and weekends on [email or web address]  Patient seen and examined with the above-signed Advanced Practice Provider and/or Housestaff. I personally reviewed laboratory data, imaging studies and relevant notes. I independently examined the patient and formulated the important aspects of the plan. I have edited the note to reflect any of my changes or salient points. I have personally discussed the plan with the patient and/or family.  Feeling better but remains mildly SOB. + Edema. Renal function getting better  General:  Obese woman lying flat  in bed  No resp difficulty HEENT: normal Neck: supple. JVP  7 Carotids 2+ bilat; no bruits. No lymphadenopathy or thryomegaly appreciated. Cor: PMI nondisplaced. Regular rate & rhythm. No rubs, gallops or murmurs. Lungs: clear Abdomen: obese soft, nontender, nondistended. No hepatosplenomegaly. No bruits or masses. Good bowel sounds. Extremities: no cyanosis, clubbing, rash, 1+ edema Neuro: alert & orientedx3, cranial nerves grossly intact. moves all 4 extremities w/o difficulty. Affect pleasant  Volume  status and renal function improved. She is very debilitated but denies SNF. Will give one more dose of IV lasix and plan home in am (on previous HF meds) Will arrange HF f/u.   We will sign off. D/w Dr. Posey Pronto at the bedside.   Glori Bickers, MD  2:30 PM

## 2020-08-17 NOTE — Evaluation (Signed)
Occupational Therapy Evaluation Patient Details Name: Susan [name] MRN: [id number] DOB: [birth year] Today's Date: 08/17/2020    History of Present Illness [name] is a 70 y/o female who reported to ED after abnormal lab results at an appointment with her PCP. Hg = 9, BNP 814, BUN 104. Pt had complaints of 11lb weight gain in 1 week, dyspnea, intermittent chest pain, and brown urine. Pt was admitted on 08/13/20 and diagnosed with AKI and acute on chronic CHF. PMH includes HTN, DM, HLD, CKD, RBBB, CAD, rotator cuff tear in 2015, R transmetatarsal amputation, and Covid.   Clinical Impression   Pt admitted with the above diagnosis and has the deficits listed below. Pt would benefit from cont OT to increase independence with basic adls and adl transfers. Pt most limited with LE dressing and would benefit from introduction to reacher and sock aid.  Pt can then decide if she would like to purchase this or depend on husband to assist. Will continue to see pt with focus on ambulatory adls and LE dressing.    Follow Up Recommendations  Home health OT;Supervision - Intermittent    Equipment Recommendations  Other (comment) (needs reacher and sock aid)    Recommendations for Other Services       Precautions / Restrictions Precautions Precautions: Fall Precaution Comments: Pt states she has been unsteady at home but has not taken a fall. Restrictions Weight Bearing Restrictions: No      Mobility Bed Mobility Overal bed mobility: Needs Assistance Bed Mobility: Supine to Sit;Sit to Supine     Supine to sit: Supervision Sit to supine: Supervision   General bed mobility comments: Pt uses bedrails but no assist needed. Pt scoots up in bed without physical assist as well.    Transfers Overall transfer level: Needs assistance Equipment used: Rolling walker (2 wheeled) Transfers: Sit to/from [employer] Sit to Stand: Min guard Stand pivot transfers: Min assist       General  transfer comment: Cues to push up from the bed.  Pt reports not dizzy today like she was yesterday.   No physical assist to stand.  Steadying assist given during transfers.    Balance Overall balance assessment: Needs assistance   Sitting balance-Leahy Scale: Good     Standing balance support: During functional activity;Bilateral upper extremity supported Standing balance-Leahy Scale: Poor Standing balance comment: Reliant on UE support                           ADL either performed or assessed with clinical judgement   ADL Overall ADL's : Needs assistance/impaired Eating/Feeding: Sitting;Independent   Grooming: Wash/dry hands;Wash/dry face;Oral care;Set up;Sitting   Upper Body Bathing: Set up;Sitting   Lower Body Bathing: Minimal assistance;Sit to/from stand;Cueing for compensatory techniques Lower Body Bathing Details (indicate cue type and reason): would benefit from adaptive equipment. Upper Body Dressing : Set up;Sitting   Lower Body Dressing: Moderate assistance;Sit to/from stand;Cueing for compensatory techniques Lower Body Dressing Details (indicate cue type and reason): Pt would benefit from sock aid/reacher Toilet Transfer: Min guard;Stand-pivot;BSC   Toileting- Clothing Manipulation and Hygiene: Supervision/safety;Sitting/lateral lean       Functional mobility during ADLs: Minimal assistance;Rolling walker General ADL Comments: Pt's husband puts pt's socks on for her daily and assists with shoes as well.  Feel adaptive equipment may help pt.     Vision Baseline Vision/History: Wears glasses Wears Glasses: Reading only Patient Visual Report: No change from baseline Vision Assessment?: No  apparent visual deficits     Perception Perception Perception Tested?: No   Praxis Praxis Praxis tested?: Within functional limits    Pertinent Vitals/Pain Pain Assessment: No/denies pain     Hand Dominance Right   Extremity/Trunk Assessment Upper  Extremity Assessment Upper Extremity Assessment: RUE deficits/detail RUE Deficits / Details: Pt with rotator cuff issues.  AROM: shoulder 110 flexion and 80 abduction.  PROM WFL.  Otherwise RUE WFL RUE: Unable to fully assess due to pain RUE Sensation: WNL RUE Coordination: WNL   Lower Extremity Assessment Lower Extremity Assessment: Defer to PT evaluation   Cervical / Trunk Assessment Cervical / Trunk Assessment: Kyphotic   Communication Communication Communication: No difficulties   Cognition Arousal/Alertness: Awake/alert Behavior During Therapy: WFL for tasks assessed/performed Overall Cognitive Status: Within Functional Limits for tasks assessed                                 General Comments: A&Ox4, very polite   General Comments  Pt with itchy skin and urinating frequently.  Encouraged pt to use toilet as much as possible.    Exercises     Shoulder Instructions      Home Living Family/patient expects to be discharged to:: Private residence Living Arrangements: Spouse/significant other Available Help at Discharge: Family;Available PRN/intermittently;Other (Comment) Type of Home: Mobile home Home Access: Ramped entrance     Home Layout: One level     Bathroom Shower/Tub: Tub/shower unit;Door   [employer] Toilet: Handicapped height Bathroom Accessibility: Yes   Home Equipment: Grab bars - tub/shower;Walker - 2 wheels;Walker - 4 wheels;Bedside commode;Cane - quad   Additional Comments: Uses RW inside for household ambulation and cane in community.  Pt has shower chair but it does not fit in her shoer.      Prior Functioning/Environment Level of Independence: Independent with assistive device(s)        Comments: household and short distanced community ambulator using SPC most of time and RW as needed. Pt drives short distances.        OT Problem List: Decreased strength;Impaired balance (sitting and/or standing);Decreased knowledge of use of  DME or AE;Increased edema      OT Treatment/Interventions: Self-care/ADL training;DME and/or AE instruction;Therapeutic activities    OT Goals(Current goals can be found in the care plan section) Acute Rehab OT Goals Patient Stated Goal: go home, work on balance OT Goal Formulation: With patient Time For Goal Achievement: 08/31/20 Potential to Achieve Goals: Good ADL Goals Pt Will Perform Lower Body Dressing: with supervision;with adaptive equipment;sit to/from stand Additional ADL Goal #1: Pt will walk to bathroom and toilet with mod I.  OT Frequency: Min 2X/week   Barriers to D/C:    husband available at night and son with disabilities can help some during day.       Co-evaluation              AM-PAC OT "6 Clicks" Daily Activity     Outcome Measure Help from another person eating meals?: None Help from another person taking care of personal grooming?: None Help from another person toileting, which includes using toliet, bedpan, or urinal?: A Little Help from another person bathing (including washing, rinsing, drying)?: A Little Help from another person to put on and taking off regular upper body clothing?: None Help from another person to put on and taking off regular lower body clothing?: A Lot 6 Click Score: 20   End of Session  Equipment Utilized During Treatment: Surveyor, mining Communication: Mobility status  Activity Tolerance: Patient tolerated treatment well Patient left: in bed;with call bell/phone within reach  OT Visit Diagnosis: Unsteadiness on feet (R26.81)                Time: 4045-9136 OT Time Calculation (min): 28 min Charges:  OT General Charges $OT Visit: 1 Visit OT Evaluation $OT Eval Moderate Complexity: 1 Mod OT Treatments $Self Care/Home Management : 8-22 mins  Glenford Peers 08/17/2020, 8:04 AM

## 2020-08-17 NOTE — Progress Notes (Signed)
Physical Therapy Treatment Patient Details Name: Susan [name] MRN: [id number] DOB: 01/23/1951 Today's Date: 08/17/2020    History of Present Illness Susan [name] is a 70 y/o female who reported to ED after abnormal lab results at an appointment with her PCP. Hg = 9, BNP 814, BUN 104. Pt had complaints of 11lb weight gain in 1 week, dyspnea, intermittent chest pain, and brown urine. Pt was admitted on 08/13/20 and diagnosed with AKI and acute on chronic CHF. PMH includes HTN, DM, HLD, CKD, RBBB, CAD, rotator cuff tear in 2015, R transmetatarsal amputation, and Covid.    PT Comments    Pt with good progress but limited by orthostatic hypotension.  She was able to ambulate and initially did not have dizziness/weakness; however, after 57' and 11' pt with symptoms of decreased response, weakness, feeling "out of it," dizziness and did have a drop of >24 points systolic BP.  Symptoms resolve with sitting.  Notified RN.   ORTHOSTATIC BP Sitting: 163/58 Standing: 145/58 Standing/walking a few mins: 129/69 and pt symptomatic     Follow Up Recommendations  Home health PT;Supervision for mobility/OOB     Equipment Recommendations  3in1 (PT)    Recommendations for Other Services       Precautions / Restrictions Precautions Precautions: Fall Precaution Comments: orthostatic bp    Mobility  Bed Mobility Overal bed mobility: Needs Assistance Bed Mobility: Supine to Sit     Supine to sit: Supervision          Transfers Overall transfer level: Needs assistance Equipment used: Rolling walker (2 wheeled) Transfers: Sit to/from Stand Sit to Stand: Min guard         General transfer comment: Min guard for safety.  Performed x 5 throughout session.  See general comments in regards to dizziness.  Ambulation/Gait Ambulation/Gait assistance: Min guard;Min assist Gait Distance (Feet): 60 Feet (80', 60', 15') Assistive device: Rolling walker (2 wheeled) Gait Pattern/deviations: Decreased  stride length;Step-to pattern Gait velocity: decreased   General Gait Details: Pt initially with min guard for safety but with fatigue and onset of dizziness/lightheadness/weakness required min A and chair to sit.  Pt ambulated 36' and became weak/unsteady/dizzy and required chair to sit.  She was then able to ambulate 68' and 15' with chair nearby and min guard-min A.  See general comments   Stairs             Wheelchair Mobility    Modified Rankin (Stroke Patients Only)       Balance Overall balance assessment: Needs assistance   Sitting balance-Leahy Scale: Good     Standing balance support: During functional activity;Bilateral upper extremity supported Standing balance-Leahy Scale: Poor Standing balance comment: Reliant on UE support                            Cognition Arousal/Alertness: Awake/alert Behavior During Therapy: WFL for tasks assessed/performed Overall Cognitive Status: Within Functional Limits for tasks assessed                                 General Comments: A&Ox4, very polite and motivated      Exercises      General Comments General comments (skin integrity, edema, etc.): Pt had reports of dizziness yesterday when she first got up.  However, had no complaints of dizziness today initially.  Pt ambulated 27' before she felt weak/"out of it"/dizzy.  Had  pt sit.  BP was 163/58 by the time obtained dynamap and got BP - pt reports feels better.  THen had pt stand and BP 145/58.  Pt then ambulated 51' with w/c nearby.  After about 72' pt again feeling weak and knees shakey - had pt remain standing in front of chair and checked BP which was 129/69.  Returned to sitting and symptoms improved.  After recovery ambulated 15' to chair without symptoms.  Notified RN pt having >20 point drop in systolic BP with standing after 3 mins.      Pertinent Vitals/Pain Pain Assessment: No/denies pain    Home Living                       Prior Function            PT Goals (current goals can now be found in the care plan section) Acute Rehab PT Goals Patient Stated Goal: go home, work on balance PT Goal Formulation: With patient Time For Goal Achievement: 08/30/20 Potential to Achieve Goals: Good Progress towards PT goals: Progressing toward goals    Frequency    Min 3X/week      PT Plan Current plan remains appropriate    Co-evaluation              AM-PAC PT "6 Clicks" Mobility   Outcome Measure  Help needed turning from your back to your side while in a flat bed without using bedrails?: None Help needed moving from lying on your back to sitting on the side of a flat bed without using bedrails?: None Help needed moving to and from a bed to a chair (including a wheelchair)?: A Little Help needed standing up from a chair using your arms (e.g., wheelchair or bedside chair)?: A Little Help needed to walk in hospital room?: A Little Help needed climbing 3-5 steps with a railing? : A Lot 6 Click Score: 19    End of Session Equipment Utilized During Treatment: Gait belt Activity Tolerance: Other (comment) (orthostatic hypotension) Patient left: with call bell/phone within reach;in chair Nurse Communication: Mobility status PT Visit Diagnosis: Unsteadiness on feet (R26.81);Muscle weakness (generalized) (M62.81)     Time: 0370-4888 PT Time Calculation (min) (ACUTE ONLY): 30 min  Charges:  $Gait Training: 8-22 mins $Therapeutic Activity: 8-22 mins                     Abran Richard, PT Acute Rehab Services Pager [phone] [name] 08/17/2020, 2:36 PM

## 2020-08-17 NOTE — Consult Note (Addendum)
[facility] Nurse Consult Note: Reason for Consult: Consult requested for bilat buttocks.  Pt is familiar to the [facility] team from a recent admission 05/27/20.  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  She states they have never healed completely and her husband applies "butt cream." prior to admission. She is frequently incontinent but currently has a Purewick to attempt to contain the urine.   Wound type: Left buttock with 2 areas of healing Stage 3 pressure injuries separated by narrow skin bridge between the sites; .5X.5X.1cm, surrounded by pink dry scar tissue from the larger previous wound which has healed.  Wound beds are 10% black to edges, 90% red and moist, small amt tan drainage, no odor or fluctuance. Right buttock with 1 area of healing Stage 3 pressure injury; 1X.8X.1cm, surrounded by pink dry scar tissue from the larger previous wound which has healed.  Wound bed is 100% red and moist, small amt tan drainage, no odor or fluctuance. Pressure Injury POA: Yes Dressing procedure/placement/frequency: Topical treatment orders provided for bedside nurses to perform as follows to protect from further injury: Foam dressing to bilat buttock wounds, change Q 3 days or PRN soiling Please re-consult if further assistance is needed.  Thank-you,  Julien Girt MSN, [place], [place], [place], [place]

## 2020-08-17 NOTE — Plan of Care (Signed)
  Problem: Education: Goal: Knowledge of General Education information will improve Description Including pain rating scale, medication(s)/side effects and non-pharmacologic comfort measures Outcome: Progressing   

## 2020-08-17 NOTE — TOC Initial Note (Signed)
Transition of Care [facility]) - Initial/Assessment Note    Patient Details  Name: Susan [name] MRN: [id number] Date of Birth: 1950/08/19  Transition of Care [facility]) CM/SW Contact:    Pollie Friar, RN Phone Number: 08/17/2020, 1:20 PM  Clinical Narrative:                 Patient lives with son that is home 4 days and gone 3 days of the week. She states he can stay the whole week if needed.  Pt has: 3 in 1, walker at home. She denies issues with home medications or transportation. Home health ordered and pt selected [place]. [place] with [facility] accepted the referral.  Pt has transport home when medically ready.  Expected Discharge Plan: [place] Barriers to Discharge: Continued Medical Work up   Patient Goals and CMS Choice   CMS Medicare.gov Compare Post Acute Care list provided to:: Patient Choice offered to / list presented to : Patient  Expected Discharge Plan and Services Expected Discharge Plan: [place]   Discharge Planning Services: CM Consult Post Acute Care Choice: [place] arrangements for the past 2 months: [place]: PT,OT [place] Agency: [place] ([place]) Date HH Agency Contacted: 08/17/20   Representative spoke with at [place]: Ramond Marrow  Prior Living Arrangements/Services Living arrangements for the past 2 months: [place] with:: Adult Children (son) Patient language and need for interpreter reviewed:: Yes Do you feel safe going back to the place where you live?: Yes      Need for Family Participation in Patient Care: Yes (Comment) Care giver support system in place?: Yes (comment)   Criminal Activity/Legal Involvement Pertinent to Current Situation/Hospitalization: No - Comment as needed  Activities of Daily Living      Permission Sought/Granted                  Emotional Assessment Appearance:: Appears stated  age Attitude/Demeanor/Rapport: Engaged Affect (typically observed): Accepting Orientation: : Oriented to Self,Oriented to Place,Oriented to  Time,Oriented to Situation   Psych Involvement: No (comment)  Admission diagnosis:  Acute on chronic diastolic heart failure (HCC) [I50.33] Hyperkalemia [E87.5] Hyponatremia [E87.1] AKI (acute kidney injury) ([place]) [N17.9] Normochromic normocytic anemia [D64.9] Acute renal failure superimposed on chronic kidney disease, unspecified CKD stage, unspecified acute renal failure type ([place]) [N17.9, N18.9] Patient Active Problem List   Diagnosis Date Noted  . AKI (acute kidney injury) ([place]) 08/14/2020  . Hypertensive urgency 08/14/2020  . Chronic kidney disease (CKD) 05/25/2020  . Hypoalbuminemia 05/25/2020  . GERD (gastroesophageal reflux disease) 05/25/2020  . CHF exacerbation ([place]) 05/24/2020  . Pressure injury of skin 05/17/2020  . Obesity, Class III, BMI 40-49.9 (morbid obesity) ([place]) 03/07/2020  . Common bile duct (CBD) obstruction 05/28/2019  . Benign neoplasm of ascending colon   . Benign neoplasm of transverse colon   . Benign neoplasm of descending colon   . Benign neoplasm of sigmoid colon   . Gastric polyps   . Prolonged QT interval 03/11/2019  . Onychomycosis 06/21/2018  . Osteomyelitis of second toe of right foot ([place])   . 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 ([place])   . PVD (peripheral vascular disease) ([place]) 10/26/2017  . Hypothyroidism 07/27/2017  . PAH (pulmonary artery hypertension) ([place])   .  Impaired ambulation 07/19/2017  . Leg cramps 02/27/2017  . Peripheral edema 01/12/2017  . Diabetic neuropathy ([place]) 11/12/2016  . 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 mellitus ([place]) 10/24/2015  . Anemia 10/03/2015  . Generalized anxiety disorder 10/03/2015  . Insomnia 10/03/2015  . Hyperglycemia due to diabetes mellitus ([place]) 06/07/2015  . Chronic diastolic CHF (congestive heart failure) ([place]) 06/07/2015  . Non  compliance with medical treatment 04/17/2014  . Rotator cuff tear 03/14/2014  . Class 3 obesity 09/23/2013  . Hypotension 12/25/2012  . Urinary incontinence   . MDD (major depressive disorder) 11/12/2010  . RBBB (right bundle branch block)   . CAD (coronary artery disease)   . Hyperlipemia 01/22/2009  . Essential hypertension 01/22/2009   PCP:  [name], MD Pharmacy:   [id number] [place], [address] [place] [address] Phone: [phone] Fax: [phone]     Social Determinants of Health (SDOH) Interventions    Readmission Risk Interventions Readmission Risk Prevention Plan 05/28/2020 03/12/2020 02/09/2020  Transportation Screening Complete Complete Complete  Medication Review (RN Care Manager) Complete Complete Complete  PCP or Specialist appointment within 3-5 days of discharge Complete Complete Complete  HRI or Home Care Consult Complete Complete Complete  SW Recovery Care/Counseling Consult Complete Complete Complete  Palliative Care Screening Not Applicable Not Applicable Not [place] Not Applicable Not Applicable Not Applicable  Some recent data might be hidden

## 2020-08-17 NOTE — Progress Notes (Signed)
[facility] Progress Note  Patient: Susan [name]    CBJ:[id number]  DOA: 08/13/2020     Date of Service: the patient was seen and examined on 08/17/2020  Brief hospital course: Past medical history of CKD stage IV, chronic diastolic CHF, CAD, right metatarsal amputation.  Presents with complaints of cough and shortness of breath.  Found to have decompensated CHF as well as AKI. Currently plan is continue diuresis per cardiology guidance..  Assessment and Plan: 1.  Acute on chronic diastolic CHF Appreciate heart failure services assistance. NICM. Echocardiogram 50 to 60% in the past. Currently continuing diuresis. Volume status improving and renal function remained stable.  Monitor  2.  Acute kidney injury on chronic kidney disease stage IV. Baseline serum creatinine around 2.0. Reported episodes of vomiting at home so may have developed some hemodynamic injury. On presentation serum creatinine 4.17.  Cardiorenal hemodynamics. Currently renal function stable.  Monitor with diuresis.  Avoid medication that can affect kidney functions.  3.  CAD Continue dual antiplatelet therapy.  Monitor.  4.  HTN Blood pressure stable.  Continue Coreg.  5.  Type II DM, uncontrolled with hyperglycemia and hypoglycemia with vascular disease and renal function complication.  With long-term history of insulin use Uses insulin pump at home. Currently insulin pump was discontinued. On sliding scale insulin along with Levemir. Episode of hypoglycemia on 3/22 with therefore dose of Levemir reduced from 40 units to 20 units.   Given that blood sugars remains elevated.  We will increase Levemir dose from 20 units to 30 units.  6.    E. Coli UTI. Urine culture positive for E. coli.  Pansensitive. Continues to have burning with urination Complete 3-day treatment regimen with Keflex.  Unable to provide Pyridium given renal dysfunction  7. History of right metatarsal amputation No acute  issues. Monitor.  8.  Constipation. Treating with bowel regimen.  9.  Itching. Etiology not clear.  Symptomatic treatment for now.  10.  Morbid obesity. Placing the patient at high risk of poor outcome. Suspect the patient is also suffering from undiagnosed OSA and may benefit from outpatient sleep study. Body mass index is 40.74 kg/m.   11.  Deconditioning. PT reevaluation continues to suggest home with home health. Patient able to ambulate 60 feet. Monitor.  Diet: Cardiac diet DVT Prophylaxis:   heparin injection 5,000 Units Start: 08/14/20 0600   Advance goals of care discussion: Full code  Family Communication: no family was present at bedside, at the time of interview.   Disposition:  Status is: Inpatient  Remains inpatient appropriate because: Continuing diuresis IV  Dispo: The patient is from: Home              Anticipated d/c is to: Home              Patient currently is not medically stable to d/c.   Difficult to place patient    Subjective: No acute complaint.  No nausea no vomiting.  More alert and oriented today.  Physical Exam:  General: Appear in mild distress, no Rash; Oral Mucosa Clear, moist. no Abnormal Neck Mass Or lumps, Conjunctiva normal  Cardiovascular: S1 and S2 Present, no Murmur, Respiratory: good respiratory effort, Bilateral Air entry present and CTA, no Crackles, no wheezes Abdomen: Bowel Sound present, Soft and no tenderness Extremities: no Pedal edema Neurology: alert and oriented to time, place, and person affect appropriate. no new focal deficit Gait not checked due to patient safety concerns  Vitals:   08/16/20 1704  08/16/20 2030 08/17/20 0615 08/17/20 0940  BP: (!) 151/78 (!) 153/73 (!) 127/58 (!) 156/65  Pulse: 91 75 74 77  Resp: 18 18 18 18   Temp: 98 F (36.7 C) 98.4 F (36.9 C) 98.1 F (36.7 C) 97.9 F (36.6 C)  TempSrc:  Oral Oral Oral  SpO2: 93% 90% 95% 95%  Weight:   114.5 kg     Intake/Output Summary (Last  24 hours) at 08/17/2020 1932 Last data filed at 08/17/2020 1700 Gross per 24 hour  Intake 1280 ml  Output 2450 ml  Net -1170 ml   Filed Weights   08/15/20 2109 08/16/20 0543 08/17/20 0615  Weight: 122.2 kg 116.9 kg 114.5 kg    Data Reviewed: I have personally reviewed and interpreted daily labs, tele strips, imaging. I reviewed all nursing notes, pharmacy notes, vitals, pertinent old records I have discussed plan of care as described above with RN and patient/family.  CBC: Recent Labs  Lab 08/13/20 1242 08/13/20 2029 08/14/20 0500 08/15/20 0219  WBC 4.8 5.8 5.5 5.2  NEUTROABS 3,442  --   --   --   HGB 9.0* 9.4* 9.6* 10.1*  HCT 28.9* 30.8* 31.5* 31.4*  MCV 84.8 86.5 86.3 83.7  PLT 155 167 174 983   Basic Metabolic Panel: Recent Labs  Lab 08/13/20 2029 08/14/20 0500 08/15/20 0219 08/16/20 0428 08/17/20 0532  NA 131* 131* 137 137 140  K 5.7* 4.4 3.7 4.2 4.3  CL 101 101 104 104 103  CO2 19* 18* 22 26 28   GLUCOSE 333* 402* 74 301* 174*  BUN 104* 101* 94* 79* 69*  CREATININE 3.91* 3.29* 2.49* 2.22* 1.89*  CALCIUM 9.0 8.9 9.1 8.9 8.9  MG  --   --  2.1 2.1  --     Studies: No results found.  Scheduled Meds: . allopurinol  100 mg Oral BID  . aspirin EC  81 mg Oral Q breakfast  . carvedilol  12.5 mg Oral BID WC  . cephALEXin  250 mg Oral Q8H  . clopidogrel  75 mg Oral Q breakfast  . ferrous sulfate  325 mg Oral BID WC  . FLUoxetine  10 mg Oral Daily  . heparin  5,000 Units Subcutaneous Q8H  . hydrocerin   Topical BID  . hydrocortisone cream   Topical BID  . insulin aspart  0-5 Units Subcutaneous QHS  . insulin aspart  0-6 Units Subcutaneous TID WC  . insulin aspart  6 Units Subcutaneous TID WC  . insulin detemir  30 Units Subcutaneous Daily  . levothyroxine  50 mcg Oral Q0600  . polyethylene glycol  17 g Oral Daily  . pregabalin  150 mg Oral BID  . senna-docusate  1 tablet Oral BID   Continuous Infusions: PRN Meds: albuterol, hydrALAZINE, hydrOXYzine,  morphine injection, nitroGLYCERIN, traZODone  Time spent: 35 minutes  Author: Berle Mull, MD [facility] 08/17/2020 7:32 PM  To reach On-call, see care teams to locate the attending and reach out via [email or web address]. Between 7PM-7AM, please contact night-coverage If you still have difficulty reaching the attending provider, please page the [facility] (Director on Call) for [facility] on amion for assistance.

## 2020-08-17 NOTE — Care Management Important Message (Signed)
Important Message  Patient Details  Name: Susan [name] MRN: [id number] Date of Birth: [birth year]   Medicare Important Message Given:  Yes - Important Message mailed due to current National Emergency   Verbal consent obtained due to current National Emergency  Relationship to patient: Self Contact Name: Iyana Call Date: 08/17/20  Time: 1258 Phone: [phone] Outcome: Spoke with contact Important Message mailed to: Patient address on file    Delorse Lek 08/17/2020, 12:58 PM

## 2020-08-18 DIAGNOSIS — N179 Acute kidney failure, unspecified: Secondary | ICD-10-CM | POA: Diagnosis not present

## 2020-08-18 LAB — BASIC METABOLIC PANEL
Anion gap: 8 (ref 5–15)
BUN: 59 mg/dL — ABNORMAL HIGH (ref 8–23)
CO2: 28 mmol/L (ref 22–32)
Calcium: 9 mg/dL (ref 8.9–10.3)
Chloride: 101 mmol/L (ref 98–111)
Creatinine, Ser: 1.78 mg/dL — ABNORMAL HIGH (ref 0.44–1.00)
GFR, Estimated: 30 mL/min — ABNORMAL LOW (ref >60)
Glucose, Bld: 229 mg/dL — ABNORMAL HIGH (ref 70–99)
Potassium: 3.9 mmol/L (ref 3.5–5.1)
Sodium: 137 mmol/L (ref 135–145)

## 2020-08-18 LAB — GLUCOSE, CAPILLARY
Glucose-Capillary: 275 mg/dL — ABNORMAL HIGH (ref 70–99)
Glucose-Capillary: 214 mg/dL — ABNORMAL HIGH (ref 70–99)
Glucose-Capillary: 332 mg/dL — ABNORMAL HIGH (ref 70–99)
Glucose-Capillary: 174 mg/dL — ABNORMAL HIGH (ref 70–99)

## 2020-08-18 LAB — MAGNESIUM: Magnesium: 1.8 mg/dL (ref 1.7–2.4)

## 2020-08-18 MED ORDER — TORSEMIDE 100 MG PO TABS
100.0000 mg | ORAL_TABLET | Freq: Every day | ORAL | Status: DC
Start: 2020-08-18 — End: 2020-08-19
  Administered 2020-08-18: 100 mg via ORAL
  Filled 2020-08-18 (×2): qty 1

## 2020-08-18 MED ORDER — TORSEMIDE 20 MG PO TABS: 80.0000 mg | ORAL_TABLET | Freq: Every evening | ORAL | Status: DC

## 2020-08-18 MED ORDER — TORSEMIDE 100 MG PO TABS: 100.0000 mg | ORAL_TABLET | Freq: Every day | ORAL | Status: DC

## 2020-08-18 MED ORDER — FUROSEMIDE 10 MG/ML IJ SOLN: 40.0000 mg | Freq: Once | INTRAMUSCULAR | Status: DC

## 2020-08-18 NOTE — Progress Notes (Signed)
[facility] Progress Note  Patient: Susan [name]    WUJ:[id number]  DOA: 08/13/2020     Date of Service: the patient was seen and examined on 08/18/2020  Brief hospital course: Past medical history of CKD stage IV, chronic diastolic CHF, CAD, right metatarsal amputation.  Presents with complaints of cough and shortness of breath.  Found to have decompensated CHF as well as AKI. Currently plan is continue diuresis per cardiology guidance..  Assessment and Plan: 1.  Acute on chronic diastolic CHF Appreciate heart failure services assistance. NICM. Echocardiogram 50 to 60% in the past. Currently continuing diuresis. Volume status improving and renal function remained stable.   Cardiology currently signed off. Recommend to resume her home regimen which is 180 mg of torsemide per day. Currently only receiving 60 mg of Lasix here in the hospital. Will initiate torsemide 100 mg and monitor renal response. Patient also tells me that she does not take anything as needed for weight gain at home, when she was informed that she actually gained 25 pounds between February 28 and March 21 she appeared to be in shock.  This is despite the fact that the patient tells me that she is checking her weight every day and notifying cardiology whenever it is about 3 pounds from her prior weight. Tells me that she is afraid of getting dehydrated therefore she keeps on drinking fluids at home.  2.  Acute kidney injury on chronic kidney disease stage IV. Baseline serum creatinine around 2.0. Reported episodes of vomiting at home so may have developed some hemodynamic injury. On presentation serum creatinine 4.17.  Cardiorenal hemodynamics. Currently renal function stable.  Monitor with diuresis.  Avoid medication that can affect kidney functions.  3.  CAD Continue dual antiplatelet therapy.  Monitor.  4.  HTN Blood pressure stable.  Continue Coreg.  5.  Type II DM, uncontrolled with hyperglycemia and  hypoglycemia with vascular disease and renal function complication.  With long-term history of insulin use Uses insulin pump at home. Currently insulin pump was discontinued. On sliding scale insulin along with Levemir. Continue Levemir at current dose.  6.    E. Coli UTI. Urine culture positive for E. coli.  Pansensitive. Continues to have burning with urination Complete 3-day treatment regimen with Keflex.  Unable to provide Pyridium given renal dysfunction  7. History of right metatarsal amputation No acute issues. Monitor.  8.  Constipation. Treating with bowel regimen.  9.  Itching. Etiology not clear.  Symptomatic treatment for now.  10.  Morbid obesity. Placing the patient at high risk of poor outcome. Suspect the patient is also suffering from undiagnosed OSA and may benefit from outpatient sleep study. Body mass index is 40.74 kg/m.   11.  Deconditioning. PT reevaluation continues to suggest home with home health. Patient able to ambulate 60 feet. Monitor.  Diet: Cardiac diet DVT Prophylaxis:   heparin injection 5,000 Units Start: 08/14/20 0600   Advance goals of care discussion: Full code  Family Communication: no family was present at bedside, at the time of interview.   Disposition:  Status is: Inpatient  Remains inpatient appropriate because: Continuing diuresis   Dispo: The patient is from: Home              Anticipated d/c is to: Home              Patient currently is not medically stable to d/c.   Difficult to place patient    Subjective: No nausea no vomiting.  No  fever no chills.  Itching resolved.  Physical Exam:  General: Appear in mild distress, no Rash; Oral Mucosa Clear, moist. no Abnormal Neck Mass Or lumps, Conjunctiva normal  Cardiovascular: S1 and S2 Present, no Murmur, Respiratory: good respiratory effort, Bilateral Air entry present and bilateral Crackles, no wheezes Abdomen: Bowel Sound present, Soft and no  tenderness Extremities: Bilateral pedal edema Neurology: alert and oriented to time, place, and person affect appropriate. no new focal deficit Gait not checked due to patient safety concerns  Vitals:   08/17/20 2037 08/18/20 0429 08/18/20 0801 08/18/20 1634  BP: (!) 154/65 125/60 (!) 128/52 (!) 150/57  Pulse: 74 70 70 79  Resp: 16 16 16 18   Temp: 97.6 F (36.4 C) (!) 97.5 F (36.4 C) 97.8 F (36.6 C) 98 F (36.7 C)  TempSrc: Oral     SpO2: 97% 92% 94% 95%  Weight:        Intake/Output Summary (Last 24 hours) at 08/18/2020 1955 Last data filed at 08/18/2020 1700 Gross per 24 hour  Intake 1020 ml  Output 1950 ml  Net -930 ml   Filed Weights   08/15/20 2109 08/16/20 0543 08/17/20 0615  Weight: 122.2 kg 116.9 kg 114.5 kg    Data Reviewed: I have personally reviewed and interpreted daily labs, tele strips, imaging. I reviewed all nursing notes, pharmacy notes, vitals, pertinent old records I have discussed plan of care as described above with RN and patient/family.  CBC: Recent Labs  Lab 08/13/20 1242 08/13/20 2029 08/14/20 0500 08/15/20 0219  WBC 4.8 5.8 5.5 5.2  NEUTROABS 3,442  --   --   --   HGB 9.0* 9.4* 9.6* 10.1*  HCT 28.9* 30.8* 31.5* 31.4*  MCV 84.8 86.5 86.3 83.7  PLT 155 167 174 102   Basic Metabolic Panel: Recent Labs  Lab 08/14/20 0500 08/15/20 0219 08/16/20 0428 08/17/20 0532 08/18/20 0242  NA 131* 137 137 140 137  K 4.4 3.7 4.2 4.3 3.9  CL 101 104 104 103 101  CO2 18* 22 26 28 28   GLUCOSE 402* 74 301* 174* 229*  BUN 101* 94* 79* 69* 59*  CREATININE 3.29* 2.49* 2.22* 1.89* 1.78*  CALCIUM 8.9 9.1 8.9 8.9 9.0  MG  --  2.1 2.1  --  1.8    Studies: No results found.  Scheduled Meds: . allopurinol  100 mg Oral BID  . aspirin EC  81 mg Oral Q breakfast  . carvedilol  12.5 mg Oral BID WC  . clopidogrel  75 mg Oral Q breakfast  . ferrous sulfate  325 mg Oral BID WC  . FLUoxetine  10 mg Oral Daily  . heparin  5,000 Units Subcutaneous Q8H   . hydrocerin   Topical BID  . hydrocortisone cream   Topical BID  . insulin aspart  0-5 Units Subcutaneous QHS  . insulin aspart  0-6 Units Subcutaneous TID WC  . insulin aspart  6 Units Subcutaneous TID WC  . insulin detemir  30 Units Subcutaneous Daily  . levothyroxine  50 mcg Oral Q0600  . polyethylene glycol  17 g Oral Daily  . pregabalin  150 mg Oral BID  . senna-docusate  1 tablet Oral BID  . torsemide  100 mg Oral Daily  . [START ON 08/19/2020] torsemide  80 mg Oral QPM   Continuous Infusions: PRN Meds: albuterol, hydrALAZINE, hydrOXYzine, morphine injection, nitroGLYCERIN, traZODone  Time spent: 35 minutes  Author: Berle Mull, MD [facility] 08/18/2020 7:55 PM  To reach On-call,  see care teams to locate the attending and reach out via [email or web address]. Between 7PM-7AM, please contact night-coverage If you still have difficulty reaching the attending provider, please page the [facility] (Director on Call) for [facility] on amion for assistance.

## 2020-08-19 LAB — BASIC METABOLIC PANEL
Anion gap: 8 (ref 5–15)
BUN: 57 mg/dL — ABNORMAL HIGH (ref 8–23)
CO2: 27 mmol/L (ref 22–32)
Calcium: 8.8 mg/dL — ABNORMAL LOW (ref 8.9–10.3)
Chloride: 101 mmol/L (ref 98–111)
Creatinine, Ser: 1.84 mg/dL — ABNORMAL HIGH (ref 0.44–1.00)
GFR, Estimated: 29 mL/min — ABNORMAL LOW (ref >60)
Glucose, Bld: 262 mg/dL — ABNORMAL HIGH (ref 70–99)
Potassium: 4.3 mmol/L (ref 3.5–5.1)
Sodium: 136 mmol/L (ref 135–145)

## 2020-08-19 LAB — GLUCOSE, CAPILLARY
Glucose-Capillary: 217 mg/dL — ABNORMAL HIGH (ref 70–99)
Glucose-Capillary: 234 mg/dL — ABNORMAL HIGH (ref 70–99)

## 2020-08-19 LAB — MAGNESIUM: Magnesium: 1.9 mg/dL (ref 1.7–2.4)

## 2020-08-19 MED ORDER — TORSEMIDE 20 MG PO TABS: 80.0000 mg | ORAL_TABLET | Freq: Two times a day (BID) | ORAL | 0 refills | Status: DC

## 2020-08-19 MED ORDER — POLYETHYLENE GLYCOL 3350 17 G PO PACK: 17.0000 g | PACK | Freq: Every day | ORAL | 0 refills | Status: DC

## 2020-08-19 MED ORDER — HYDROCERIN EX CREA: 1.0000 "application " | TOPICAL_CREAM | Freq: Two times a day (BID) | CUTANEOUS | 0 refills | Status: DC

## 2020-08-19 MED ORDER — TORSEMIDE 20 MG PO TABS
80.0000 mg | ORAL_TABLET | Freq: Two times a day (BID) | ORAL | Status: DC
  Administered 2020-08-19: 80 mg via ORAL
  Filled 2020-08-19 (×2): qty 4

## 2020-08-19 NOTE — Progress Notes (Signed)
DISCHARGE NOTE HOME Weslie Rasmus to be discharged to home per MD order. Discussed prescriptions and follow up appointments with the patient. Prescriptions given to patient; medication list explained in detail. Patient verbalized understanding.  Skin clean, dry and intact without evidence of skin break down, no evidence of skin tears noted. IV catheter discontinued intact. Site without signs and symptoms of complications. Dressing and pressure applied. Pt denies pain at the site currently. No complaints noted.  Patient free of lines, drains, and wounds.   An After Visit Summary (AVS) was printed and given to the patient. Patient escorted via wheelchair, and discharged home via private auto.  [place], Zenon Mayo, RN

## 2020-08-19 NOTE — TOC Transition Note (Signed)
Transition of Care [facility]) - CM/SW Discharge Note   Patient Details  Name: Susan [name] MRN: [id number] Date of Birth: 04-28-1951  [facility] of [facility]) CM/SW Contact:  Pollie Friar, RN Phone Number: 08/19/2020, 11:18 AM   Clinical Narrative:    Pt is discharging home with [facility] through [facility].  Pt has 3 in 1 at home. Pt has transportation home.    Final next level of care: Home w Home Health Services Barriers to Discharge: No Barriers Identified   Patient Goals and CMS Choice   CMS Medicare.gov Compare Post Acute Care list provided to:: Patient Choice offered to / list presented to : Patient  Discharge Placement                       Discharge Plan and Services   Discharge Planning Services: CM Consult Post Acute Care Choice: [place]: PT,OT [facility] Agency: [place] ([place]) Date [facility] Agency Contacted: 08/17/20   Representative spoke with at [place]: [place] ([place]) Interventions     Readmission Risk Interventions Readmission Risk Prevention Plan 05/28/2020 03/12/2020 02/09/2020  Transportation Screening Complete Complete Complete  Medication Review Press [employer]) Complete Complete Complete  PCP or Specialist appointment within 3-5 days of discharge Complete Complete Complete  HRI or Home Care Consult Complete Complete Complete  SW Recovery Care/Counseling Consult Complete Complete Complete  Palliative Care Screening Not Applicable Not Applicable Not [facility] Not Applicable Not Applicable Not Applicable  Some recent data might be hidden

## 2020-08-19 NOTE — Discharge Instructions (Signed)
Heart Failure Action Plan A heart failure action plan helps you understand what to do when you have symptoms of heart failure. Your action plan is a color-coded plan that lists the symptoms to watch for and indicates what actions to take.  If you have symptoms in the red zone, you need medical care right away.  If you have symptoms in the yellow zone, you are having problems.  If you have symptoms in the green zone, you are doing well. Follow the plan that was created by you and your health care provider. Review your plan each time you visit your health care provider. Red zone These signs and symptoms mean you should get medical help right away:  You have trouble breathing when resting.  You have a dry cough that is getting worse.  You have swelling or pain in your legs or abdomen that is getting worse.  You suddenly gain more than 2-3 lb (0.9-1.4 kg) in 24 hours, or more than 5 lb (2.3 kg) in a week. This amount may be more or less depending on your condition.  You have trouble staying awake or you feel confused.  You have chest pain.  You do not have an appetite.  You pass out.  You have worsening sadness or depression. If you have any of these symptoms, call your local emergency services (911 in the [place] right away. Do not drive yourself to the hospital.   Yellow zone These signs and symptoms mean your condition may be getting worse and you should make some changes:  You have trouble breathing when you are active, or you need to sleep with your head raised on extra pillows to help you breathe.  You have swelling in your legs or abdomen.  You gain 2-3 lb (0.9-1.4 kg) in 24 hours, or 5 lb (2.3 kg) in a week. This amount may be more or less depending on your condition.  You get tired easily.  You have trouble sleeping.  You have a dry cough. If you have any of these symptoms:  Contact your health care provider within the next day.  Your health care provider may adjust  your medicines.   Green zone These signs mean you are doing well and can continue what you are doing:  You do not have shortness of breath.  You have very little swelling or no new swelling.  Your weight is stable (no gain or loss).  You have a normal activity level.  You do not have chest pain or any other new symptoms.   Follow these instructions at home:  Take over-the-counter and prescription medicines only as told by your health care provider.  Weigh yourself daily. Your target weight is ____252______ lb (_____115_____ kg). ? Call your health care provider if you gain more than ____5______ lb in 24 hours, or more than ____5______ lb in a week.  Eat a heart-healthy diet. Work with a diet and nutrition specialist (dietitian) to create an eating plan that is best for you.  Keep all follow-up visits. This is important. Where to find more information  American Heart Association: www.heart.org Summary  A heart failure action plan helps you understand what to do when you have symptoms of heart failure.  Follow the action plan that was created by you and your health care provider.  Get help right away if you have any symptoms in the red zone. This information is not intended to replace advice given to you by your health care provider. Make  sure you discuss any questions you have with your health care provider. Document Revised: 12/26/2019 Document Reviewed: 12/26/2019 Elsevier Patient Education  [place].  Heart Failure Eating Plan Heart failure, also called congestive heart failure, occurs when your heart does not pump blood well enough to meet your body's needs for oxygen-rich blood. Heart failure is a long-term (chronic) condition. Living with heart failure can be challenging. Following your health care provider's instructions about a healthy lifestyle and working with a dietitian to choose the right foods may help to improve your symptoms. An eating plan for someone  with heart failure will include changes that limit the intake of salt (sodium) and unhealthy fat. What are tips for following this plan? Reading food labels  Check food labels for the amount of sodium per serving. Choose foods that have less than 140 mg (milligrams) of sodium in each serving.  Check food labels for the number of calories per serving. This is important if you need to limit your daily calorie intake to lose weight.  Check food labels for the serving size. If you eat more than one serving, you will be eating more sodium and calories than what is listed on the label.  Look for foods that are labeled as "sodium-free," "very low sodium," or "low sodium." ? Foods labeled as "reduced sodium" or "lightly salted" may still have more sodium than what is recommended for you. Cooking  Avoid adding salt when cooking. Ask your health care provider or dietitian before using salt substitutes.  Season food with salt-free seasonings, spices, or herbs. Check the label of seasoning mixes to make sure they do not contain salt.  Cook with heart-healthy oils, such as olive, canola, soybean, or sunflower oil.  Do not fry foods. Cook foods using low-fat methods, such as baking, boiling, grilling, and broiling.  Limit unhealthy fats when cooking by: ? Removing the skin from poultry, such as chicken. ? Removing all visible fats from meats. ? Skimming the fat off from stews, soups, and gravies before serving them. Meal planning  Limit your intake of: ? Processed, canned, or prepackaged foods. ? Foods that are high in trans fat, such as fried foods. ? Sweets, desserts, sugary drinks, and other foods with added sugar. ? Full-fat dairy products, such as whole milk.  Eat a balanced diet. This may include: ? 4-5 servings of fruit each day and 4-5 servings of vegetables each day. At each meal, try to fill one-half of your plate with fruits and vegetables. ? Up to 6-8 servings of whole grains each  day. ? Up to 2 servings of lean meat, poultry, or fish each day. One serving of meat is equal to 3 oz (85 g). This is about the same size as a deck of cards. ? 2 servings of low-fat dairy each day. ? Heart-healthy fats. Healthy fats called omega-3 fatty acids are found in foods such as flaxseed and cold-water fish like sardines, salmon, and mackerel.  Aim to eat 25-35 g (grams) of fiber a day. Foods that are high in fiber include apples, broccoli, carrots, beans, peas, and whole grains.  Do not add salt or condiments that contain salt (such as soy sauce) to foods before eating.  When eating at a restaurant, ask that your food be prepared with less salt or no salt, if possible.  Try to eat 2 or more vegetarian meals each week.  Eat more home-cooked food and eat less restaurant, buffet, and fast food.   General information  Do not eat more than 2,300 mg of sodium a day. The amount of sodium that is recommended for you may be lower, depending on your condition.  Maintain a healthy body weight as directed. Ask your health care provider what a healthy weight is for you. ? Check your weight every day. ? Work with your health care provider and dietitian to make a plan that is right for you to lose weight or maintain your current weight.  Limit how much fluid you drink. Ask your health care provider or dietitian how much fluid you can have each day.  Limit or avoid alcohol as told by your health care provider or dietitian. Recommended foods Fruits All fresh, frozen, and canned fruits. Dried fruits, such as raisins, prunes, and cranberries. Vegetables All fresh vegetables. Vegetables that are frozen without sauce or added salt. Low-sodium or sodium-free canned vegetables. Grains Bread with less than 80 mg of sodium per slice. Whole-wheat pasta, quinoa, and brown rice. Oats and oatmeal. Barley. [place]. Grits and cream of wheat. Whole-grain and whole-wheat cold cereal. Meats and other protein  foods Lean cuts of meat. Skinless chicken and [place]. Fish with high omega-3 fatty acids, such as salmon, sardines, and other cold-water fishes. Eggs. Dried beans, peas, and edamame. Unsalted nuts and nut butters. Dairy Low-fat or nonfat (skim) milk and dried milk. Rice milk, soy milk, and almond milk. Low-fat or nonfat yogurt. Small amounts of reduced-sodium block cheese. Low-sodium cottage cheese. Fats and oils Olive, canola, soybean, flaxseed, avocado, or sunflower oil. Sweets and desserts Applesauce. Granola bars. Sugar-free pudding and gelatin. Frozen fruit bars. Seasoning and other foods Fresh and dried herbs. Lemon or lime juice. Vinegar. Low-sodium ketchup. Salt-free marinades, salad dressings, sauces, and seasonings. The items listed above may not be a complete list of foods and beverages you can eat. Contact a dietitian for more information. Foods to avoid Fruits Fruits that are dried with sodium-containing preservatives. Vegetables Canned vegetables. Frozen vegetables with sauce or seasonings. Creamed vegetables. [place] fries. Onion rings. Pickled vegetables and sauerkraut. Grains Bread with more than 80 mg of sodium per slice. Hot or cold cereal with more than 140 mg sodium per serving. Salted pretzels and crackers. Prepackaged breadcrumbs. Bagels, croissants, and biscuits. Meats and other protein foods Ribs and chicken wings. Bacon, ham, pepperoni, bologna, salami, and packaged luncheon meats. Hot dogs, bratwurst, and sausage. Canned meat. Smoked meat and fish. Salted nuts and seeds. Dairy Whole milk, half-and-half, and cream. Buttermilk. Processed cheese, cheese spreads, and cheese curds. Regular cottage cheese. Feta cheese. Shredded cheese. String cheese. Fats and oils Butter, lard, shortening, ghee, and bacon fat. Canned and packaged gravies. Seasoning and other foods Onion salt, garlic salt, table salt, and sea salt. Marinades. Regular salad dressings. Relishes, pickles, and  olives. Meat flavorings and tenderizers, and bouillon cubes. Horseradish, ketchup, and mustard. Worcestershire sauce. Teriyaki sauce, soy sauce (including reduced sodium). Hot sauce and Tabasco sauce. Steak sauce, fish sauce, oyster sauce, and cocktail sauce. Taco seasonings. Barbecue sauce. Tartar sauce. The items listed above may not be a complete list of foods and beverages you should avoid. Contact a dietitian for more information. Summary  A heart failure eating plan includes changes that limit your intake of sodium and unhealthy fat, and it may help you lose weight or maintain a healthy weight. Your health care provider may also recommend limiting how much fluid you drink.  Most people with heart failure should eat no more than 2,300 mg of salt (sodium) a day. The amount  of sodium that is recommended for you may be lower, depending on your condition.  Contact your health care provider or dietitian before making any major changes to your diet. This information is not intended to replace advice given to you by your health care provider. Make sure you discuss any questions you have with your health care provider. Document Revised: 12/26/2019 Document Reviewed: 12/26/2019 Elsevier Patient Education  2021 [employer].

## 2020-08-20 ENCOUNTER — Other Ambulatory Visit: Payer: Self-pay

## 2020-08-20 ENCOUNTER — Ambulatory Visit (INDEPENDENT_AMBULATORY_CARE_PROVIDER_SITE_OTHER): Payer: HMO | Admitting: Family Medicine

## 2020-08-20 ENCOUNTER — Encounter: Payer: Self-pay | Admitting: Family Medicine

## 2020-08-20 ENCOUNTER — Telehealth: Payer: Self-pay | Admitting: *Deleted

## 2020-08-20 VITALS — BP 138/78 | HR 74 | Temp 97.9°F | Resp 16 | Ht 66.0 in | Wt 254.0 lb

## 2020-08-20 DIAGNOSIS — I5032 Chronic diastolic (congestive) heart failure: Secondary | ICD-10-CM

## 2020-08-20 DIAGNOSIS — N179 Acute kidney failure, unspecified: Secondary | ICD-10-CM

## 2020-08-20 DIAGNOSIS — N1832 Chronic kidney disease, stage 3b: Secondary | ICD-10-CM | POA: Diagnosis not present

## 2020-08-20 MED ORDER — MICONAZOLE NITRATE 2 % EX POWD: CUTANEOUS | 0 refills | Status: DC | PRN

## 2020-08-20 NOTE — Progress Notes (Signed)
Subjective:    Patient ID: Susan [name], female    DOB: May 26, 1951, 70 y.o.   MRN: [id number]  HPI  Patient is a 70 year old Caucasian female who was previously seen my partner but is here today establishing care with me.  My partner has since retired.  Patient has a history of insulin-dependent diabetes mellitus.  She currently sees an endocrinologist.  She is on an insulin pump.  However her most recent A1c was 11.0.  Patient reports widely variable blood sugars.  For instance this morning it was around 70.  Prior to coming to my office it was over 300.  She is performing sliding scale insulin correction in addition to her maintenance dose.  She also has congestive heart failure and is currently taking torsemide 80 mg twice a day.  She states that she is weighing herself every day and giving herself additional diuretic if her weight goes up more than 3 pounds.  However she was in this office on February 28 as well as on March 22.  In the interval time, she had gained over 20 pounds and had not notified us.  Therefore I question if she is weighing herself every day and sticking to her regimen.  She presents today reporting some mild shortness of breath.  She was discharged from the hospital yesterday.  Her creatinine at discharge was 1.8 down from over 4 on admission.  She has diuresed over 20 pounds over the last few days.  She does have still +1 edema in both legs with chronic venous stasis changes to the mid shin bilaterally.  There are 2 small blisters on the right anterior shin that are not weeping and have scabbed over. Past Medical History:  Diagnosis Date  . Acute MI ([place]) 1999; 2007  . Anemia    hx  . Anginal pain ([place])   . Anxiety   . ARF (acute renal failure) ([place]) 06/2017   [facility]  . Arthritis    "generalized" (03/15/2014)  . CAD (coronary artery disease)    MI in 2000 - MI  2007 - treated bare metal stent (no nuclear since then as 9/11)  . Carotid artery disease  ([place])   . CHF (congestive heart failure) ([place])   . Chronic diastolic heart failure (HCC)    a) ECHO (08/2013) EF 55-60% and RV function nl b) RHC (08/2013) RA 4, RV 30/5/7, PA 25/10 (16), PCWP 7, Fick CO/CI 6.3/2.7, PVR 1.5 WU, PA 61 and 66%  . Daily headache    "~ every other day; since I fell in June" (03/15/2014)  . Depression   . Dyslipidemia   . Dyspnea   . Exertional shortness of breath   . HTN (hypertension)   . Hypothyroidism   . Neuropathy   . Obesity   . Osteoarthritis   . Peripheral neuropathy   . PONV (postoperative nausea and vomiting)   . RBBB (right bundle branch block)    Old  . Stroke [facility])    mini strokes  . Syncope    likely due to low blood sugar  . Tachycardia    Sinus tachycardia  . Type II diabetes mellitus (HCC)    Type II  . Urinary incontinence   . Venous insufficiency    Past Surgical History:  Procedure Laterality Date  . ABDOMINAL HYSTERECTOMY  1980's  . AMPUTATION Right 02/24/2018   Procedure: RIGHT FOOT GREAT TOE AND 2ND TOE AMPUTATION;  Surgeon: Newt Minion, MD;  Location: [place];  Service: Orthopedics;  Laterality: Right;  . AMPUTATION Right 04/30/2018   Procedure: RIGHT TRANSMETATARSAL AMPUTATION;  Surgeon: Newt Minion, MD;  Location: [place];  Service: Orthopedics;  Laterality: Right;  . BIOPSY  05/27/2020   Procedure: BIOPSY;  Surgeon: [name], DO;  [facility];  Service: Endoscopy;;  . CATARACT EXTRACTION, BILATERAL Bilateral ?2013  . COLONOSCOPY W/ POLYPECTOMY    . COLONOSCOPY WITH PROPOFOL N/A 03/13/2019   Procedure: COLONOSCOPY WITH PROPOFOL;  Surgeon: Jerene Bears, MD;  Location: [place];  Service: Gastroenterology;  Laterality: N/A;  . [place]; 2007   "1 + 1"  . ESOPHAGOGASTRODUODENOSCOPY (EGD) WITH PROPOFOL N/A 03/13/2019   Procedure: ESOPHAGOGASTRODUODENOSCOPY (EGD) WITH PROPOFOL;  Surgeon: Jerene Bears, MD;  Location: [facility] ENDOSCOPY;  Service: Gastroenterology;   Laterality: N/A;  . ESOPHAGOGASTRODUODENOSCOPY (EGD) WITH PROPOFOL N/A 05/27/2020   Procedure: ESOPHAGOGASTRODUODENOSCOPY (EGD) WITH PROPOFOL;  Surgeon: [name], DO;  [facility];  Service: Endoscopy;  Laterality: N/A;  . EYE SURGERY Bilateral    lazer  . HEMOSTASIS CLIP PLACEMENT  03/13/2019   Procedure: HEMOSTASIS CLIP PLACEMENT;  Surgeon: Jerene Bears, MD;  Location: [facility] ENDOSCOPY;  Service: Gastroenterology;;  . KNEE ARTHROSCOPY Left 10/25/2006  . POLYPECTOMY  03/13/2019   Procedure: POLYPECTOMY;  Surgeon: Jerene Bears, MD;  Location: [place];  Service: Gastroenterology;;  . RIGHT HEART CATH N/A 07/24/2017   Procedure: RIGHT HEART CATH;  Surgeon: Jolaine Artist, MD;  Location: [place] CV LAB;  Service: Cardiovascular;  Laterality: N/A;  . RIGHT HEART CATHETERIZATION N/A 09/22/2013   Procedure: RIGHT HEART CATH;  Surgeon: Jolaine Artist, MD;  Location: [facility] CATH LAB;  Service: Cardiovascular;  Laterality: N/A;  . SHOULDER ARTHROSCOPY WITH OPEN ROTATOR CUFF REPAIR Right 03/14/2014   Procedure: RIGHT SHOULDER ARTHROSCOPY WITH BICEPS RELEASE, OPEN SUBSCAPULA REPAIR, OPEN SUPRASPINATUS REPAIR.;  Surgeon: Meredith Pel, MD;  Location: [place];  Service: Orthopedics;  Laterality: Right;  . TOE AMPUTATION Right 02/24/2018   GREAT TOE AND 2ND TOE AMPUTATION  . TUBAL LIGATION  1970's   Current Outpatient Medications on File Prior to Visit  Medication Sig Dispense Refill  . acetaminophen (TYLENOL) 500 MG tablet Take 1,000 mg by mouth every 6 (six) hours as needed for moderate pain or headache.    . albuterol (VENTOLIN HFA) 108 (90 Base) MCG/ACT inhaler TAKE 2 PUFFS BY MOUTH EVERY 6 HOURS AS NEEDED FOR WHEEZE OR SHORTNESS OF BREATH (Patient taking differently: Inhale 2 puffs into the lungs every 6 (six) hours as needed for wheezing or shortness of breath.) 8.5 g 1  . allopurinol (ZYLOPRIM) 100 MG tablet TAKE 1 TABLET BY MOUTH TWICE A DAY (Patient taking  differently: Take 100 mg by mouth 2 (two) times daily.) 180 tablet 4  . Ascorbic Acid (VITAMIN C) 1000 MG tablet Take 1,000 mg by mouth daily.    Marland Kitchen aspirin EC 81 MG tablet Take 1 tablet (81 mg total) by mouth daily with breakfast. 30 tablet 11  . Bismuth Tribromoph-Petrolatum (XEROFORM PETROLAT GAUZE 5"X9") MISC Apply 1 each topically 2 (two) times daily. For dressing changes 50 each 1  . carvedilol (COREG) 12.5 MG tablet TAKE 1 TABLET (12.5 MG TOTAL) BY MOUTH 2 (TWO) TIMES DAILY WITH A MEAL. 180 tablet 1  . cholecalciferol (VITAMIN D) 1000 units tablet Take 1,000 Units by mouth daily with supper.     . clopidogrel (PLAVIX) 75 MG tablet TAKE 1 TABLET BY  MOUTH DAILY WITH BREAKFAST. 90 tablet 3  . ferrous sulfate 325 (65 FE) MG tablet Take 1 tablet (325 mg total) by mouth 2 (two) times daily with a meal. Reported on 08/21/2015 60 tablet 2  . FLUoxetine (PROZAC) 20 MG capsule Take 10 mg by mouth daily.    Marland Kitchen guaiFENesin-dextromethorphan (ROBITUSSIN DM) 100-10 MG/5ML syrup Take 10 mLs by mouth every 4 (four) hours as needed for cough. 118 mL 0  . hydrocerin (EUCERIN) CREA Apply 1 application topically 2 (two) times daily. 113 g 0  . Insulin Disposable Pump (OMNIPOD DASH 5 PACK PODS) MISC Inject into the skin See admin instructions. Use continuously with Novolin R - change every 72 hours.    . insulin NPH Human (HUMULIN N,NOVOLIN N) 100 UNIT/ML injection Inject 15 Units into the skin See admin instructions. Inject 15 units subcutaneously in the morning if CBG >300;    . insulin regular (NOVOLIN R) 100 units/mL injection Inject into the skin See admin instructions. Manually add bolus to continuous dose via OmniPod 3 times daily per sliding scale (CBG 80-150 7 units, 151-200 9 units, 201-250 12 units, 251-300 14 units, 301- 400 17 units)    . levothyroxine (SYNTHROID, LEVOTHROID) 50 MCG tablet Take 1 tablet (50 mcg total) by mouth daily before breakfast. 90 tablet 1  . magnesium oxide (MAG-OX) 400 MG tablet  TAKE 1 TABLET BY MOUTH EVERY DAY (Patient taking differently: Take 400 mg by mouth daily.) 120 tablet 2  . Multiple Vitamins-Minerals (AIRBORNE GUMMIES PO) Take 2 tablets by mouth every evening.    . Multiple Vitamins-Minerals (CENTRUM SILVER 50+WOMEN) TABS Take 1 tablet by mouth every evening.    . Multiple Vitamins-Minerals (ZINC PO) Take 1 tablet by mouth every evening.    . nitroGLYCERIN (NITROSTAT) 0.4 MG SL tablet Place 1 tablet (0.4 mg total) under the tongue every 5 (five) minutes as needed for chest pain. 25 tablet 1  . ondansetron (ZOFRAN) 4 MG tablet Take 1 tablet (4 mg total) by mouth every 8 (eight) hours as needed for nausea or vomiting. 30 tablet 1  . ONETOUCH VERIO test strip 3 (three) times daily.    . polyethylene glycol (MIRALAX / GLYCOLAX) 17 g packet Take 17 g by mouth daily. 14 each 0  . potassium chloride SA (KLOR-CON) 20 MEQ tablet Take 20 mEq by mouth daily.    . pregabalin (LYRICA) 150 MG capsule Take 1 capsule (150 mg total) by mouth 2 (two) times daily. 60 capsule 2  . torsemide (DEMADEX) 20 MG tablet Take 4 tablets (80 mg total) by mouth 2 (two) times daily. Take 100 mg bid on the days you see weight gain of 3lbs. 120 tablet 0  . traZODone (DESYREL) 100 MG tablet TAKE 1 AND 1/2 TABLETS BY MOUTH AT BEDTIME AS NEEDED FOR SLEEP. (Patient taking differently: Take 200 mg by mouth at bedtime.) 135 tablet 1  . ursodiol (ACTIGALL) 500 MG tablet Take 500 mg by mouth 3 (three) times daily.     No current facility-administered medications on file prior to visit.   Allergies  Allergen Reactions  . Codeine Nausea And Vomiting   Social History   Socioeconomic History  . Marital status: Married    Spouse name: Not on file  . Number of children: 3  . Years of education: 12th  . Highest education level: Not on file  Occupational History    Employer: UNEMPLOYED  Tobacco Use  . Smoking status: Former Smoker    Packs/day: 3.00  Years: 32.00    Pack years: 96.00    Types:  Cigarettes    Quit date: 10/24/1997    Years since quitting: 22.8  . Smokeless tobacco: Never Used  Vaping Use  . Vaping Use: Never used  Substance and Sexual Activity  . Alcohol use: Not Currently    Comment: "might have 2-3 daiquiris in the summer"  . Drug use: No  . Sexual activity: Never  Other Topics Concern  . Not on file  Social History Narrative   Pt lives at home with her spouse.   Caffeine Use- 3 sodas daily   Social Determinants of Health   Financial Resource Strain: Low Risk   . Difficulty of Paying Living Expenses: Not very hard  Food Insecurity: No Food Insecurity  . Worried About Charity fundraiser in the Last Year: Never true  . Ran Out of Food in the Last Year: Never true  Transportation Needs: No Transportation Needs  . Lack of Transportation (Medical): No  . Lack of Transportation (Non-Medical): No  Physical Activity: Not on file  Stress: Not on file  Social Connections: Not on file  Intimate Partner Violence: Not on file     Review of Systems  All other systems reviewed and are negative.      Objective:   Physical Exam Vitals reviewed.  Constitutional:      General: She is not in acute distress.    Appearance: She is obese. She is not ill-appearing, toxic-appearing or diaphoretic.  Cardiovascular:     Rate and Rhythm: Normal rate and regular rhythm.     Heart sounds: Normal heart sounds. No murmur heard. No gallop.   Pulmonary:     Effort: Pulmonary effort is normal. No respiratory distress.     Breath sounds: No stridor. No wheezing or rhonchi.  Abdominal:     General: Bowel sounds are normal. There is no distension.     Palpations: Abdomen is soft.  Musculoskeletal:     Right lower leg: Edema present.     Left lower leg: Edema present.  Skin:    Findings: Rash present.  Neurological:     Mental Status: She is alert.           Assessment & Plan:  Chronic diastolic CHF (congestive heart failure) (HCC) - Plan: BASIC METABOLIC  PANEL WITH GFR  AKI (acute kidney injury) ([place]) - Plan: BASIC METABOLIC PANEL WITH GFR  Stage 3b chronic kidney disease ([place]) - Plan: BASIC METABOLIC PANEL WITH GFR  For the present time, I want the patient to stay on torsemide 80 mg twice daily.  I want to check a BMP today to monitor her potassium as well as her renal function.  I have asked the patient to call my office every Monday, Wednesday, and Friday with a weight update so that we can track her weight more closely.  That is the only way I can see to prevent recurrent admission due to fluid overload because I question if the patient is altering the dose of her diuretic based on her weight.  She states that her weight according to her scales was 259 pounds this morning.  Therefore I would anticipate to try to keep her weight around 259 pounds by her report.  We will need to adjust her torsemide to maintain that and watch her renal function closely.  Of also recommended that she schedule a follow-up appointment with her endocrinologist.  Her A1c was just over 8 in December and  had increased to 11 in March.  Therefore I question if her pump is functioning normally or if she has been compliant with the sliding scale.  I want her to schedule a follow-up with her endocrinologist to discuss her management.  These 2 issues I see are the most dangerous for the patient.

## 2020-08-20 NOTE — Telephone Encounter (Signed)
Transition Care Management Follow-up Telephone Call  Date of discharge and from where: 08/19/2020 - [facility]  How have you been since you were released from the hospital? "Getting by"  Any questions or concerns? No  Items Reviewed:  Did the pt receive and understand the discharge instructions provided? Yes   Medications obtained and verified? Yes   Other? No   Any new allergies since your discharge? No   Dietary orders reviewed? Yes  Do you have support at home? Yes    Functional Questionnaire: (I = Independent and D = Dependent) ADLs: I  Bathing/Dressing- I  Meal Prep- I  Eating- I  Maintaining continence- I  Transferring/Ambulation- I  Managing Meds- I  Follow up appointments reviewed:   PCP Hospital f/u appt confirmed? Yes  Scheduled to see Dr. Dennard Schaumann on 08/20/2020 @ 1500.  [facility] f/u appt confirmed? Yes  Scheduled to see Cardiology on 09/04/2020 @ 1330.  Are transportation arrangements needed? No   If their condition worsens, is the pt aware to call PCP or go to the Emergency Dept.? Yes  Was the patient provided with contact information for the PCP's office or ED? Yes  Was to pt encouraged to call back with questions or concerns? Yes

## 2020-08-21 ENCOUNTER — Telehealth (HOSPITAL_COMMUNITY): Payer: Self-pay

## 2020-08-21 ENCOUNTER — Encounter (HOSPITAL_COMMUNITY): Payer: Self-pay

## 2020-08-21 ENCOUNTER — Emergency Department (HOSPITAL_COMMUNITY): Payer: HMO

## 2020-08-21 ENCOUNTER — Inpatient Hospital Stay (HOSPITAL_COMMUNITY)
Admission: EM | Admit: 2020-08-21 | Discharge: 2020-08-27 | DRG: 682 | Disposition: A | Discharge status: Home / Self Care | Payer: HMO | Attending: Internal Medicine | Admitting: Internal Medicine

## 2020-08-21 ENCOUNTER — Other Ambulatory Visit: Payer: Self-pay

## 2020-08-21 ENCOUNTER — Telehealth: Payer: Self-pay | Admitting: *Deleted

## 2020-08-21 DIAGNOSIS — B9620 Unspecified Escherichia coli [E. coli] as the cause of diseases classified elsewhere: Secondary | ICD-10-CM | POA: Diagnosis present

## 2020-08-21 DIAGNOSIS — M159 Polyosteoarthritis, unspecified: Secondary | ICD-10-CM | POA: Diagnosis present

## 2020-08-21 DIAGNOSIS — I4510 Unspecified right bundle-branch block: Secondary | ICD-10-CM | POA: Diagnosis present

## 2020-08-21 DIAGNOSIS — E1165 Type 2 diabetes mellitus with hyperglycemia: Secondary | ICD-10-CM | POA: Diagnosis present

## 2020-08-21 DIAGNOSIS — R9431 Abnormal electrocardiogram [ECG] [EKG]: Secondary | ICD-10-CM | POA: Diagnosis not present

## 2020-08-21 DIAGNOSIS — I872 Venous insufficiency (chronic) (peripheral): Secondary | ICD-10-CM | POA: Diagnosis present

## 2020-08-21 DIAGNOSIS — I252 Old myocardial infarction: Secondary | ICD-10-CM | POA: Diagnosis not present

## 2020-08-21 DIAGNOSIS — Z7982 Long term (current) use of aspirin: Secondary | ICD-10-CM

## 2020-08-21 DIAGNOSIS — Z794 Long term (current) use of insulin: Secondary | ICD-10-CM

## 2020-08-21 DIAGNOSIS — Z87891 Personal history of nicotine dependence: Secondary | ICD-10-CM

## 2020-08-21 DIAGNOSIS — Z7902 Long term (current) use of antithrombotics/antiplatelets: Secondary | ICD-10-CM | POA: Diagnosis not present

## 2020-08-21 DIAGNOSIS — I5023 Acute on chronic systolic (congestive) heart failure: Secondary | ICD-10-CM | POA: Diagnosis present

## 2020-08-21 DIAGNOSIS — D7389 Other diseases of spleen: Secondary | ICD-10-CM | POA: Diagnosis not present

## 2020-08-21 DIAGNOSIS — E1142 Type 2 diabetes mellitus with diabetic polyneuropathy: Secondary | ICD-10-CM | POA: Diagnosis present

## 2020-08-21 DIAGNOSIS — E1122 Type 2 diabetes mellitus with diabetic chronic kidney disease: Secondary | ICD-10-CM | POA: Diagnosis present

## 2020-08-21 DIAGNOSIS — Z87440 Personal history of urinary (tract) infections: Secondary | ICD-10-CM | POA: Diagnosis not present

## 2020-08-21 DIAGNOSIS — I10 Essential (primary) hypertension: Secondary | ICD-10-CM | POA: Diagnosis present

## 2020-08-21 DIAGNOSIS — I2510 Atherosclerotic heart disease of native coronary artery without angina pectoris: Secondary | ICD-10-CM

## 2020-08-21 DIAGNOSIS — N184 Chronic kidney disease, stage 4 (severe): Secondary | ICD-10-CM | POA: Diagnosis present

## 2020-08-21 DIAGNOSIS — M1990 Unspecified osteoarthritis, unspecified site: Secondary | ICD-10-CM | POA: Diagnosis not present

## 2020-08-21 DIAGNOSIS — Z955 Presence of coronary angioplasty implant and graft: Secondary | ICD-10-CM

## 2020-08-21 DIAGNOSIS — I130 Hypertensive heart and chronic kidney disease with heart failure and stage 1 through stage 4 chronic kidney disease, or unspecified chronic kidney disease: Secondary | ICD-10-CM | POA: Diagnosis present

## 2020-08-21 DIAGNOSIS — N179 Acute kidney failure, unspecified: Secondary | ICD-10-CM | POA: Diagnosis present

## 2020-08-21 DIAGNOSIS — Z9851 Tubal ligation status: Secondary | ICD-10-CM

## 2020-08-21 DIAGNOSIS — Z885 Allergy status to narcotic agent status: Secondary | ICD-10-CM

## 2020-08-21 DIAGNOSIS — D631 Anemia in chronic kidney disease: Secondary | ICD-10-CM | POA: Diagnosis not present

## 2020-08-21 DIAGNOSIS — I272 Pulmonary hypertension, unspecified: Secondary | ICD-10-CM | POA: Diagnosis present

## 2020-08-21 DIAGNOSIS — I5033 Acute on chronic diastolic (congestive) heart failure: Secondary | ICD-10-CM | POA: Diagnosis present

## 2020-08-21 DIAGNOSIS — I5032 Chronic diastolic (congestive) heart failure: Secondary | ICD-10-CM | POA: Diagnosis present

## 2020-08-21 DIAGNOSIS — E875 Hyperkalemia: Secondary | ICD-10-CM

## 2020-08-21 DIAGNOSIS — Z20822 Contact with and (suspected) exposure to covid-19: Secondary | ICD-10-CM | POA: Diagnosis present

## 2020-08-21 DIAGNOSIS — M109 Gout, unspecified: Secondary | ICD-10-CM | POA: Diagnosis not present

## 2020-08-21 DIAGNOSIS — R0600 Dyspnea, unspecified: Secondary | ICD-10-CM

## 2020-08-21 DIAGNOSIS — Z8673 Personal history of transient ischemic attack (TIA), and cerebral infarction without residual deficits: Secondary | ICD-10-CM

## 2020-08-21 DIAGNOSIS — Z6841 Body Mass Index (BMI) 40.0 and over, adult: Secondary | ICD-10-CM | POA: Diagnosis not present

## 2020-08-21 DIAGNOSIS — I5022 Chronic systolic (congestive) heart failure: Secondary | ICD-10-CM | POA: Diagnosis present

## 2020-08-21 DIAGNOSIS — Z79890 Hormone replacement therapy (postmenopausal): Secondary | ICD-10-CM

## 2020-08-21 DIAGNOSIS — Z89431 Acquired absence of right foot: Secondary | ICD-10-CM

## 2020-08-21 DIAGNOSIS — J209 Acute bronchitis, unspecified: Secondary | ICD-10-CM | POA: Diagnosis not present

## 2020-08-21 DIAGNOSIS — E039 Hypothyroidism, unspecified: Secondary | ICD-10-CM | POA: Diagnosis present

## 2020-08-21 DIAGNOSIS — Z8249 Family history of ischemic heart disease and other diseases of the circulatory system: Secondary | ICD-10-CM

## 2020-08-21 DIAGNOSIS — Z89411 Acquired absence of right great toe: Secondary | ICD-10-CM

## 2020-08-21 DIAGNOSIS — Z89421 Acquired absence of other right toe(s): Secondary | ICD-10-CM

## 2020-08-21 DIAGNOSIS — E785 Hyperlipidemia, unspecified: Secondary | ICD-10-CM | POA: Diagnosis present

## 2020-08-21 DIAGNOSIS — K8020 Calculus of gallbladder without cholecystitis without obstruction: Secondary | ICD-10-CM | POA: Diagnosis not present

## 2020-08-21 DIAGNOSIS — N390 Urinary tract infection, site not specified: Secondary | ICD-10-CM | POA: Diagnosis present

## 2020-08-21 DIAGNOSIS — Z79899 Other long term (current) drug therapy: Secondary | ICD-10-CM

## 2020-08-21 DIAGNOSIS — Z90710 Acquired absence of both cervix and uterus: Secondary | ICD-10-CM

## 2020-08-21 DIAGNOSIS — E1151 Type 2 diabetes mellitus with diabetic peripheral angiopathy without gangrene: Secondary | ICD-10-CM | POA: Diagnosis not present

## 2020-08-21 DIAGNOSIS — K8080 Other cholelithiasis without obstruction: Secondary | ICD-10-CM | POA: Diagnosis not present

## 2020-08-21 DIAGNOSIS — R0602 Shortness of breath: Secondary | ICD-10-CM | POA: Diagnosis not present

## 2020-08-21 DIAGNOSIS — I509 Heart failure, unspecified: Secondary | ICD-10-CM | POA: Diagnosis not present

## 2020-08-21 LAB — COMPREHENSIVE METABOLIC PANEL
ALT: 16 U/L (ref 0–44)
AST: 23 U/L (ref 15–41)
Albumin: 3.2 g/dL — ABNORMAL LOW (ref 3.5–5.0)
Alkaline Phosphatase: 207 U/L — ABNORMAL HIGH (ref 38–126)
Anion gap: 10 (ref 5–15)
BUN: 76 mg/dL — ABNORMAL HIGH (ref 8–23)
CO2: 26 mmol/L (ref 22–32)
Calcium: 9 mg/dL (ref 8.9–10.3)
Chloride: 98 mmol/L (ref 98–111)
Creatinine, Ser: 3.17 mg/dL — ABNORMAL HIGH (ref 0.44–1.00)
GFR, Estimated: 15 mL/min — ABNORMAL LOW (ref >60)
Glucose, Bld: 102 mg/dL — ABNORMAL HIGH (ref 70–99)
Potassium: 5.2 mmol/L — ABNORMAL HIGH (ref 3.5–5.1)
Sodium: 134 mmol/L — ABNORMAL LOW (ref 135–145)
Total Bilirubin: 0.5 mg/dL (ref 0.3–1.2)
Total Protein: 6.3 g/dL — ABNORMAL LOW (ref 6.5–8.1)

## 2020-08-21 LAB — CBC
HCT: 37.6 % (ref 36.0–46.0)
Hemoglobin: 11.7 g/dL — ABNORMAL LOW (ref 12.0–15.0)
MCH: 26.8 pg (ref 26.0–34.0)
MCHC: 31.1 g/dL (ref 30.0–36.0)
MCV: 86.2 fL (ref 80.0–100.0)
Platelets: 224 10*3/uL (ref 150–400)
RBC: 4.36 MIL/uL (ref 3.87–5.11)
RDW: 18 % — ABNORMAL HIGH (ref 11.5–15.5)
WBC: 11.1 10*3/uL — ABNORMAL HIGH (ref 4.0–10.5)
nRBC: 0 % (ref 0.0–0.2)

## 2020-08-21 LAB — BASIC METABOLIC PANEL WITH GFR
BUN/Creatinine Ratio: 26 (calc) — ABNORMAL HIGH (ref 6–22)
BUN: 67 mg/dL — ABNORMAL HIGH (ref 7–25)
CO2: 23 mmol/L (ref 20–32)
Calcium: 9.2 mg/dL (ref 8.6–10.4)
Chloride: 96 mmol/L — ABNORMAL LOW (ref 98–110)
Creat: 2.57 mg/dL — ABNORMAL HIGH (ref 0.60–0.93)
GFR, Est African American: 21 mL/min/{1.73_m2} — ABNORMAL LOW (ref >=60)
GFR, Est Non African American: 18 mL/min/{1.73_m2} — ABNORMAL LOW (ref >=60)
Glucose, Bld: 362 mg/dL — ABNORMAL HIGH (ref 65–99)
Potassium: 5.6 mmol/L — ABNORMAL HIGH (ref 3.5–5.3)
Sodium: 135 mmol/L (ref 135–146)

## 2020-08-21 LAB — LIPASE, BLOOD: Lipase: 34 U/L (ref 11–51)

## 2020-08-21 NOTE — Telephone Encounter (Signed)
-----   Message from Marylouise Stacks, EMT sent at 08/21/2020  2:19 PM EDT ----- [name],  I am one of the Community Paramedics here in [facility] and I see this patient that through the [facility] failure [facility] through Dr. Clayborne Dana office.  I am hoping you can pass this along to Dr. Dennard Schaumann as well.  I spoke to her today and she reports taking both of her torsemide doses today and so far she has not urinated any today at all. She feels bloated and more short of breath. She reports her weight at home is 250. She does have talking scales so I hope she is hearing them correctly.  I plan to see her Thursday morning and she knows to call in to your office tomor to report her weight. Anything I can do to help further I will be glad to, our goal is to keep her out of the hospital too and help her manage this at home.   Thank you!  Please feel free to call me also.  [place], EMT-Paramedic  08/21/20

## 2020-08-21 NOTE — Telephone Encounter (Signed)
Spoke to pt today to sch home visit this week and she reports she feels more sob with bloating, no urine output all day despite both doses of her torsemide. Sent this to her PCP and they advised for her to go back to ER. I also advised provider at CHF clinic as well and is agreeable for that plan.  Nurse staff at PCP also called her to report she needs to go to ER, however when I called her to advise that as well, she reports she does not feel up to doing that right now, she wants to stay home and drink more fluids and see if she is able to start urinating. I will call her back to check on her tomor.  Marylouise Stacks, EMT-Paramedic  08/21/20

## 2020-08-21 NOTE — ED Notes (Signed)
Bladder scan 137mL

## 2020-08-21 NOTE — ED Triage Notes (Addendum)
Pt presents to ED from home, was recently discharged for elevated creatnine. Had blood work drawn yesterday, was called by PCP today and told to come to ED for "kidney labs up again". Pt states she has started having lower back and abdominal pain earlier today.

## 2020-08-21 NOTE — ED Provider Notes (Signed)
[facility] EMERGENCY DEPARTMENT Provider Note   CSN: [id number] Arrival date & time: 08/21/20  1913     History Chief Complaint  Patient presents with  . Abnormal Lab    Susan [name] is a 70 y.o. female.  Patient with history of HLD, HTN, dCHF, obesity, hypothyroidism, CKD baseline creatinine of 2.0, CAD, T2DM with recent admission for CHF with superimposed AKI, discharged 3/27 after successful diuresis and creatinine 1.84. She followed up with her primary care yesterday, 3/28, for repeat labs and was contacted today directing her to return to the hospital for "abnormal labs". On arrival, she reports onset of lower abdominal pain today, associated with low back pain. She denies dysuria but states that despite continuing her "water pills" today, her last urination was this morning. No fever, nausea, vomiting, chest pain, SOB over baseline, or cough.    The history is provided by the patient. No language interpreter was used.  Abnormal Lab      Past Medical History:  Diagnosis Date  . Acute MI ([place]) 1999; 2007  . Anemia    hx  . Anginal pain ([place])   . Anxiety   . ARF (acute renal failure) ([place]) 06/2017   [facility]  . Arthritis    "generalized" (03/15/2014)  . CAD (coronary artery disease)    MI in 2000 - MI  2007 - treated bare metal stent (no nuclear since then as 9/11)  . Carotid artery disease ([place])   . CHF (congestive heart failure) ([place])   . Chronic diastolic heart failure (HCC)    a) ECHO (08/2013) EF 55-60% and RV function nl b) RHC (08/2013) RA 4, RV 30/5/7, PA 25/10 (16), PCWP 7, Fick CO/CI 6.3/2.7, PVR 1.5 WU, PA 61 and 66%  . Daily headache    "~ every other day; since I fell in June" (03/15/2014)  . Depression   . Dyslipidemia   . Dyspnea   . Exertional shortness of breath   . HTN (hypertension)   . Hypothyroidism   . Neuropathy   . Obesity   . Osteoarthritis   . Peripheral neuropathy   . PONV (postoperative nausea and  vomiting)   . RBBB (right bundle branch block)    Old  . Stroke [facility])    mini strokes  . Syncope    likely due to low blood sugar  . Tachycardia    Sinus tachycardia  . Type II diabetes mellitus (HCC)    Type II  . Urinary incontinence   . Venous insufficiency     Patient Active Problem List   Diagnosis Date Noted  . AKI (acute kidney injury) ([place]) 08/14/2020  . Hypertensive urgency 08/14/2020  . Chronic kidney disease (CKD) 05/25/2020  . Hypoalbuminemia 05/25/2020  . GERD (gastroesophageal reflux disease) 05/25/2020  . CHF exacerbation ([place]) 05/24/2020  . Pressure injury of skin 05/17/2020  . Obesity, Class III, BMI 40-49.9 (morbid obesity) ([place]) 03/07/2020  . Common bile duct (CBD) obstruction 05/28/2019  . Benign neoplasm of ascending colon   . Benign neoplasm of transverse colon   . Benign neoplasm of descending colon   . Benign neoplasm of sigmoid colon   . Gastric polyps   . Prolonged QT interval 03/11/2019  . Onychomycosis 06/21/2018  . Osteomyelitis of second toe of right foot ([place])   . 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 ([place])   . PVD (peripheral vascular disease) ([place]) 10/26/2017  . Hypothyroidism 07/27/2017  . PAH (pulmonary artery  hypertension) ([place])   . Impaired ambulation 07/19/2017  . Leg cramps 02/27/2017  . Peripheral edema 01/12/2017  . Diabetic neuropathy ([place]) 11/12/2016  . 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 mellitus ([place]) 10/24/2015  . Anemia 10/03/2015  . Generalized anxiety disorder 10/03/2015  . Insomnia 10/03/2015  . Hyperglycemia due to diabetes mellitus ([place]) 06/07/2015  . Chronic diastolic CHF (congestive heart failure) ([place]) 06/07/2015  . Non compliance with medical treatment 04/17/2014  . Rotator cuff tear 03/14/2014  . Class 3 obesity 09/23/2013  . Hypotension 12/25/2012  . Urinary incontinence   . MDD (major depressive disorder) 11/12/2010  . RBBB (right bundle branch block)   . CAD (coronary artery disease)    . Hyperlipemia 01/22/2009  . Essential hypertension 01/22/2009    Past Surgical History:  Procedure Laterality Date  . ABDOMINAL HYSTERECTOMY  1980's  . AMPUTATION Right 02/24/2018   Procedure: RIGHT FOOT GREAT TOE AND 2ND TOE AMPUTATION;  Surgeon: Newt Minion, MD;  Location: [place];  Service: Orthopedics;  Laterality: Right;  . AMPUTATION Right 04/30/2018   Procedure: RIGHT TRANSMETATARSAL AMPUTATION;  Surgeon: Newt Minion, MD;  Location: [place];  Service: Orthopedics;  Laterality: Right;  . BIOPSY  05/27/2020   Procedure: BIOPSY;  Surgeon: [name], DO;  [facility];  Service: Endoscopy;;  . CATARACT EXTRACTION, BILATERAL Bilateral ?2013  . COLONOSCOPY W/ POLYPECTOMY    . COLONOSCOPY WITH PROPOFOL N/A 03/13/2019   Procedure: COLONOSCOPY WITH PROPOFOL;  Surgeon: Jerene Bears, MD;  Location: [place];  Service: Gastroenterology;  Laterality: N/A;  . [place]; 2007   "1 + 1"  . ESOPHAGOGASTRODUODENOSCOPY (EGD) WITH PROPOFOL N/A 03/13/2019   Procedure: ESOPHAGOGASTRODUODENOSCOPY (EGD) WITH PROPOFOL;  Surgeon: Jerene Bears, MD;  Location[facility] ENDOSCOPY;  Service: Gastroenterology;  Laterality: N/A;  . ESOPHAGOGASTRODUODENOSCOPY (EGD) WITH PROPOFOL N/A 05/27/2020   Procedure: ESOPHAGOGASTRODUODENOSCOPY (EGD) WITH PROPOFOL;  Surgeon: [name], DO;  [facility];  Service: Endoscopy;  Laterality: N/A;  . EYE SURGERY Bilateral    l[place]  . HEMOSTASIS CLIP PLACEMENT  03/13/2019   Procedure: HEMOSTASIS CLIP PLACEMENT;  Surgeon: Jerene Bears, MD;  Location: [facility] ENDOSCOPY;  Service: Gastroenterology;;  . KNEE ARTHROSCOPY Left 10/25/2006  . POLYPECTOMY  03/13/2019   Procedure: POLYPECTOMY;  Surgeon: Jerene Bears, MD;  Location: [place];  Service: Gastroenterology;;  . RIGHT HEART CATH N/A 07/24/2017   Procedure: RIGHT HEART CATH;  Surgeon: Jolaine Artist, MD;  Location: [place] CV LAB;  Service:  Cardiovascular;  Laterality: N/A;  . RIGHT HEART CATHETERIZATION N/A 09/22/2013   Procedure: RIGHT HEART CATH;  Surgeon: Jolaine Artist, MD;  Location: [facility] CATH LAB;  Service: Cardiovascular;  Laterality: N/A;  . SHOULDER ARTHROSCOPY WITH OPEN ROTATOR CUFF REPAIR Right 03/14/2014   Procedure: RIGHT SHOULDER ARTHROSCOPY WITH BICEPS RELEASE, OPEN SUBSCAPULA REPAIR, OPEN SUPRASPINATUS REPAIR.;  Surgeon: Meredith Pel, MD;  Location: [place];  Service: Orthopedics;  Laterality: Right;  . TOE AMPUTATION Right 02/24/2018   GREAT TOE AND 2ND TOE AMPUTATION  . TUBAL LIGATION  1970's     OB History   No obstetric history on file.     Family History  Problem Relation Age of Onset  . Heart attack Mother 71    Social History   Tobacco Use  . Smoking status: Former Smoker    Packs/day: 3.00    Years: 32.00    Pack years: 96.00  Types: Cigarettes    Quit date: 10/24/1997    Years since quitting: 22.8  . Smokeless tobacco: Never Used  Vaping Use  . Vaping Use: Never used  Substance Use Topics  . Alcohol use: Not Currently    Comment: "might have 2-3 daiquiris in the summer"  . Drug use: No    Home Medications Prior to Admission medications   Medication Sig Start Date End Date Taking? Authorizing Provider  acetaminophen (TYLENOL) 500 MG tablet Take 1,000 mg by mouth every 6 (six) hours as needed for moderate pain or headache.    [provider]  albuterol (VENTOLIN HFA) 108 (90 Base) MCG/ACT inhaler TAKE 2 PUFFS BY MOUTH EVERY 6 HOURS AS NEEDED FOR WHEEZE OR SHORTNESS OF BREATH Patient taking differently: Inhale 2 puffs into the lungs every 6 (six) hours as needed for wheezing or shortness of breath. 06/27/19   [place], [name], MD  allopurinol (ZYLOPRIM) 100 MG tablet TAKE 1 TABLET BY MOUTH TWICE A DAY Patient taking differently: Take 100 mg by mouth 2 (two) times daily. 08/02/19   Newt Minion, MD  Ascorbic Acid (VITAMIN C) 1000 MG tablet Take 1,000 mg by mouth daily.     [provider]  aspirin EC 81 MG tablet Take 1 tablet (81 mg total) by mouth daily with breakfast. 05/28/20   [place], Courage, MD  Bismuth Tribromoph-Petrolatum (XEROFORM PETROLAT GAUZE 5"X9") MISC Apply 1 each topically 2 (two) times daily. For dressing changes 05/28/20   Roxan Hockey, MD  carvedilol (COREG) 12.5 MG tablet TAKE 1 TABLET (12.5 MG TOTAL) BY MOUTH 2 (TWO) TIMES DAILY WITH A MEAL. 08/13/20   [name], MD  cholecalciferol (VITAMIN D) 1000 units tablet Take 1,000 Units by mouth daily with supper.     [provider]  clopidogrel (PLAVIX) 75 MG tablet TAKE 1 TABLET BY MOUTH DAILY WITH BREAKFAST. 08/15/20   Clegg, Amy D, NP  ferrous sulfate 325 (65 FE) MG tablet Take 1 tablet (325 mg total) by mouth 2 (two) times daily with a meal. Reported on 08/21/2015 06/07/18   Alycia Rossetti, MD  FLUoxetine (PROZAC) 20 MG capsule Take 10 mg by mouth daily.    [provider]  guaiFENesin-dextromethorphan (ROBITUSSIN DM) 100-10 MG/5ML syrup Take 10 mLs by mouth every 4 (four) hours as needed for cough. 03/13/20   [place] Eric J, DO  hydrocerin (EUCERIN) CREA Apply 1 application topically 2 (two) times daily. 08/19/20   Lavina Hamman, MD  Insulin Disposable Pump (OMNIPOD DASH 5 PACK PODS) MISC Inject into the skin See admin instructions. Use continuously with Novolin R - change every 72 hours. 11/18/18   [provider]  insulin NPH Human (HUMULIN N,NOVOLIN N) 100 UNIT/ML injection Inject 15 Units into the skin See admin instructions. Inject 15 units subcutaneously in the morning if CBG >300;    [provider]  insulin regular (NOVOLIN R) 100 units/mL injection Inject into the skin See admin instructions. Manually add bolus to continuous dose via OmniPod 3 times daily per sliding scale (CBG 80-150 7 units, 151-200 9 units, 201-250 12 units, 251-300 14 units, 301- 400 17 units)    [provider]  levothyroxine (SYNTHROID, LEVOTHROID) 50 MCG  tablet Take 1 tablet (50 mcg total) by mouth daily before breakfast. 11/07/16   Dena Billet B, PA-C  magnesium oxide (MAG-OX) 400 MG tablet TAKE 1 TABLET BY MOUTH EVERY DAY Patient taking differently: Take 400 mg by mouth daily. 07/23/20   [place],  [name], MD  miconazole (MICOTIN) 2 % powder Apply topically as needed for itching. 08/20/20   [name], MD  Multiple Vitamins-Minerals (AIRBORNE GUMMIES PO) Take 2 tablets by mouth every evening.    [provider]  Multiple Vitamins-Minerals (CENTRUM SILVER 50+WOMEN) TABS Take 1 tablet by mouth every evening.    [provider]  Multiple Vitamins-Minerals (ZINC PO) Take 1 tablet by mouth every evening.    [provider]  nitroGLYCERIN (NITROSTAT) 0.4 MG SL tablet Place 1 tablet (0.4 mg total) under the tongue every 5 (five) minutes as needed for chest pain. 04/28/19   Clegg, Amy D, NP  ondansetron (ZOFRAN) 4 MG tablet Take 1 tablet (4 mg total) by mouth every 8 (eight) hours as needed for nausea or vomiting. 05/28/20 05/28/21  Roxan Hockey, MD  [facility] VERIO test strip 3 (three) times daily. 07/25/20   [provider]  polyethylene glycol (MIRALAX / GLYCOLAX) 17 g packet Take 17 g by mouth daily. 08/19/20   Lavina Hamman, MD  potassium chloride SA (KLOR-CON) 20 MEQ tablet Take 20 mEq by mouth daily.    [provider]  pregabalin (LYRICA) 150 MG capsule Take 1 capsule (150 mg total) by mouth 2 (two) times daily. 11/12/16   Alycia Rossetti, MD  torsemide (DEMADEX) 20 MG tablet Take 4 tablets (80 mg total) by mouth 2 (two) times daily. Take 100 mg bid on the days you see weight gain of 3lbs. 08/19/20   Lavina Hamman, MD  traZODone (DESYREL) 100 MG tablet TAKE 1 AND 1/2 TABLETS BY MOUTH AT BEDTIME AS NEEDED FOR SLEEP. Patient taking differently: Take 200 mg by mouth at bedtime. 01/26/20   Alycia Rossetti, MD  ursodiol (ACTIGALL) 500 MG tablet Take 500 mg by mouth 3 (three) times daily. 09/09/19    [provider]    Allergies    Codeine  Review of Systems   Review of Systems  Constitutional: Negative for chills and fever.  HENT: Negative.   Respiratory: Negative.   Cardiovascular: Negative.   Gastrointestinal: Positive for abdominal pain. Negative for nausea and vomiting.  Genitourinary: Positive for decreased urine volume. Negative for dysuria.  Musculoskeletal: Positive for back pain.  Skin: Negative.   Neurological: Negative.  Negative for weakness.    Physical Exam Updated Vital Signs BP (!) 165/80   Pulse 86   Temp 98.2 F (36.8 C) (Oral)   Resp 18   SpO2 96%   Physical Exam Vitals and nursing note reviewed.  Constitutional:      General: She is not in acute distress.    Appearance: Normal appearance. She is well-developed. She is obese.  HENT:     Head: Normocephalic.     Mouth/Throat:     Mouth: Mucous membranes are moist.  Cardiovascular:     Rate and Rhythm: Normal rate and regular rhythm.     Heart sounds: No murmur heard.   Pulmonary:     Effort: Pulmonary effort is normal.     Breath sounds: Normal breath sounds. No wheezing, rhonchi or rales.  Abdominal:     Palpations: Abdomen is soft.     Tenderness: There is abdominal tenderness (Suprapubic tenderness). There is no guarding or rebound.  Musculoskeletal:        General: Normal range of motion.     Cervical back: Normal range of motion and neck supple.  Skin:    General: Skin is warm and dry.     Findings:  No rash.  Neurological:     Mental Status: She is alert and oriented to person, place, and time.     ED Results / Procedures / Treatments   Labs (all labs ordered are listed, but only abnormal results are displayed) Labs Reviewed  COMPREHENSIVE METABOLIC PANEL - Abnormal; Notable for the following components:      Result Value   Sodium 134 (*)    Potassium 5.2 (*)    Glucose, Bld 102 (*)    BUN 76 (*)    Creatinine, Ser 3.17 (*)    Total Protein 6.3 (*)    Albumin  3.2 (*)    Alkaline Phosphatase 207 (*)    GFR, Estimated 15 (*)    All other components within normal limits  CBC - Abnormal; Notable for the following components:   WBC 11.1 (*)    Hemoglobin 11.7 (*)    RDW 18.0 (*)    All other components within normal limits  LIPASE, BLOOD  URINALYSIS, ROUTINE W REFLEX MICROSCOPIC   Results for orders placed or performed during the hospital encounter of 08/21/20  Lipase, blood  Result Value Ref Range   Lipase 34 11 - 51 U/L  Comprehensive metabolic panel  Result Value Ref Range   Sodium 134 (L) 135 - 145 mmol/L   Potassium 5.2 (H) 3.5 - 5.1 mmol/L   Chloride 98 98 - 111 mmol/L   CO2 26 22 - 32 mmol/L   Glucose, Bld 102 (H) 70 - 99 mg/dL   BUN 76 (H) 8 - 23 mg/dL   Creatinine, Ser 3.17 (H) 0.44 - 1.00 mg/dL   Calcium 9.0 8.9 - 10.3 mg/dL   Total Protein 6.3 (L) 6.5 - 8.1 g/dL   Albumin 3.2 (L) 3.5 - 5.0 g/dL   AST 23 15 - 41 U/L   ALT 16 0 - 44 U/L   Alkaline Phosphatase 207 (H) 38 - 126 U/L   Total Bilirubin 0.5 0.3 - 1.2 mg/dL   GFR, Estimated 15 (L) >60 mL/min   Anion gap 10 5 - 15  CBC  Result Value Ref Range   WBC 11.1 (H) 4.0 - 10.5 K/uL   RBC 4.36 3.87 - 5.11 MIL/uL   Hemoglobin 11.7 (L) 12.0 - 15.0 g/dL   HCT 37.6 36.0 - 46.0 %   MCV 86.2 80.0 - 100.0 fL   MCH 26.8 26.0 - 34.0 pg   MCHC 31.1 30.0 - 36.0 g/dL   RDW 18.0 (H) 11.5 - 15.5 %   Platelets 224 150 - 400 K/uL   nRBC 0.0 0.0 - 0.2 %   *Note: Due to a large number of results and/or encounters for the requested time period, some results have not been displayed. A complete set of results can be found in Results Review.    EKG None  Radiology No results found.  Procedures Procedures   Medications Ordered in ED Medications - No data to display  ED Course  I have reviewed the triage vital signs and the nursing notes.  Pertinent labs & imaging results that were available during my care of the patient were reviewed by me and considered in my medical decision  making (see chart for details).    MDM Rules/Calculators/A&P                          Patient with complicated medical history presents on the direction of her doctor for evaluation of abnormal labs obtained yesterday  during in-office hospital follow up.   Chart reviewed. Admitted 3/22 for volume overload and AKI, cr 4.1, medically managed with discharge 3/25. Creatinine 1.84 on 3/25. No ss/sxs recurrent CHF tonight. VSS.   Labs show Cr 3.17, BUN 76. Bladder scan shows on 183 cc urine.The patient was able to urinate afterward, UA pending. Repeat bladder scan to verify is 0.  Discussed admission with Dr. Cyd Silence, [facility], who accepts the patient for re-admission.   Final Clinical Impression(s) / ED Diagnoses Final diagnoses:  None   1. AKI  Rx / DC Orders ED Discharge Orders    None       Dennie Bible 08/22/20 0106    Truddie Hidden, MD 08/22/20 1324

## 2020-08-21 NOTE — Telephone Encounter (Signed)
Call placed to patient.   Reports that she still has not been able to void at all today.  Advised that BMP shows increased kidney damage and advised to go to ER for evaluation.   Verbalized understanding, but reports she has no transportation at this time.   States that she will wait until spouse returns from work.

## 2020-08-22 ENCOUNTER — Encounter (HOSPITAL_COMMUNITY): Payer: Self-pay | Admitting: Internal Medicine

## 2020-08-22 ENCOUNTER — Observation Stay (HOSPITAL_COMMUNITY): Payer: HMO

## 2020-08-22 ENCOUNTER — Other Ambulatory Visit: Payer: Self-pay

## 2020-08-22 ENCOUNTER — Telehealth: Payer: Self-pay | Admitting: *Deleted

## 2020-08-22 DIAGNOSIS — E875 Hyperkalemia: Secondary | ICD-10-CM | POA: Diagnosis present

## 2020-08-22 DIAGNOSIS — N390 Urinary tract infection, site not specified: Secondary | ICD-10-CM | POA: Diagnosis present

## 2020-08-22 DIAGNOSIS — Z9851 Tubal ligation status: Secondary | ICD-10-CM | POA: Diagnosis not present

## 2020-08-22 DIAGNOSIS — N179 Acute kidney failure, unspecified: Secondary | ICD-10-CM | POA: Diagnosis present

## 2020-08-22 DIAGNOSIS — K8020 Calculus of gallbladder without cholecystitis without obstruction: Secondary | ICD-10-CM | POA: Diagnosis not present

## 2020-08-22 DIAGNOSIS — I252 Old myocardial infarction: Secondary | ICD-10-CM | POA: Diagnosis not present

## 2020-08-22 DIAGNOSIS — E039 Hypothyroidism, unspecified: Secondary | ICD-10-CM | POA: Diagnosis present

## 2020-08-22 DIAGNOSIS — Z6841 Body Mass Index (BMI) 40.0 and over, adult: Secondary | ICD-10-CM | POA: Diagnosis not present

## 2020-08-22 DIAGNOSIS — B9620 Unspecified Escherichia coli [E. coli] as the cause of diseases classified elsewhere: Secondary | ICD-10-CM | POA: Diagnosis present

## 2020-08-22 DIAGNOSIS — N184 Chronic kidney disease, stage 4 (severe): Secondary | ICD-10-CM | POA: Diagnosis present

## 2020-08-22 DIAGNOSIS — I2510 Atherosclerotic heart disease of native coronary artery without angina pectoris: Secondary | ICD-10-CM | POA: Diagnosis present

## 2020-08-22 DIAGNOSIS — M159 Polyosteoarthritis, unspecified: Secondary | ICD-10-CM | POA: Diagnosis present

## 2020-08-22 DIAGNOSIS — I272 Pulmonary hypertension, unspecified: Secondary | ICD-10-CM | POA: Diagnosis present

## 2020-08-22 DIAGNOSIS — I130 Hypertensive heart and chronic kidney disease with heart failure and stage 1 through stage 4 chronic kidney disease, or unspecified chronic kidney disease: Secondary | ICD-10-CM | POA: Diagnosis present

## 2020-08-22 DIAGNOSIS — I872 Venous insufficiency (chronic) (peripheral): Secondary | ICD-10-CM | POA: Diagnosis present

## 2020-08-22 DIAGNOSIS — E785 Hyperlipidemia, unspecified: Secondary | ICD-10-CM | POA: Diagnosis present

## 2020-08-22 DIAGNOSIS — D7389 Other diseases of spleen: Secondary | ICD-10-CM | POA: Diagnosis not present

## 2020-08-22 DIAGNOSIS — I10 Essential (primary) hypertension: Secondary | ICD-10-CM

## 2020-08-22 DIAGNOSIS — E1122 Type 2 diabetes mellitus with diabetic chronic kidney disease: Secondary | ICD-10-CM | POA: Diagnosis present

## 2020-08-22 DIAGNOSIS — K8080 Other cholelithiasis without obstruction: Secondary | ICD-10-CM | POA: Diagnosis not present

## 2020-08-22 DIAGNOSIS — I5032 Chronic diastolic (congestive) heart failure: Secondary | ICD-10-CM

## 2020-08-22 DIAGNOSIS — I5033 Acute on chronic diastolic (congestive) heart failure: Secondary | ICD-10-CM | POA: Diagnosis present

## 2020-08-22 DIAGNOSIS — J209 Acute bronchitis, unspecified: Secondary | ICD-10-CM | POA: Diagnosis not present

## 2020-08-22 DIAGNOSIS — E1142 Type 2 diabetes mellitus with diabetic polyneuropathy: Secondary | ICD-10-CM | POA: Diagnosis present

## 2020-08-22 DIAGNOSIS — Z87440 Personal history of urinary (tract) infections: Secondary | ICD-10-CM | POA: Diagnosis not present

## 2020-08-22 DIAGNOSIS — E1165 Type 2 diabetes mellitus with hyperglycemia: Secondary | ICD-10-CM | POA: Diagnosis present

## 2020-08-22 DIAGNOSIS — Z20822 Contact with and (suspected) exposure to covid-19: Secondary | ICD-10-CM | POA: Diagnosis present

## 2020-08-22 DIAGNOSIS — I4510 Unspecified right bundle-branch block: Secondary | ICD-10-CM | POA: Diagnosis present

## 2020-08-22 DIAGNOSIS — Z7902 Long term (current) use of antithrombotics/antiplatelets: Secondary | ICD-10-CM | POA: Diagnosis not present

## 2020-08-22 DIAGNOSIS — R9431 Abnormal electrocardiogram [ECG] [EKG]: Secondary | ICD-10-CM

## 2020-08-22 LAB — URINALYSIS, ROUTINE W REFLEX MICROSCOPIC
Bilirubin Urine: NEGATIVE
Glucose, UA: NEGATIVE mg/dL
Hgb urine dipstick: NEGATIVE
Ketones, ur: NEGATIVE mg/dL
Nitrite: NEGATIVE
Protein, ur: 100 mg/dL — AB
Specific Gravity, Urine: 1.011 (ref 1.005–1.030)
WBC, UA: >50 WBC/hpf — ABNORMAL HIGH (ref 0–5)
pH: 5 (ref 5.0–8.0)

## 2020-08-22 LAB — BASIC METABOLIC PANEL
Anion gap: 10 (ref 5–15)
BUN: 75 mg/dL — ABNORMAL HIGH (ref 8–23)
CO2: 26 mmol/L (ref 22–32)
Calcium: 8.7 mg/dL — ABNORMAL LOW (ref 8.9–10.3)
Chloride: 99 mmol/L (ref 98–111)
Creatinine, Ser: 2.62 mg/dL — ABNORMAL HIGH (ref 0.44–1.00)
GFR, Estimated: 19 mL/min — ABNORMAL LOW (ref >60)
Glucose, Bld: 289 mg/dL — ABNORMAL HIGH (ref 70–99)
Potassium: 4.5 mmol/L (ref 3.5–5.1)
Sodium: 135 mmol/L (ref 135–145)

## 2020-08-22 LAB — GLUCOSE, CAPILLARY
Glucose-Capillary: 293 mg/dL — ABNORMAL HIGH (ref 70–99)
Glucose-Capillary: 308 mg/dL — ABNORMAL HIGH (ref 70–99)

## 2020-08-22 LAB — COMPREHENSIVE METABOLIC PANEL
ALT: 9 U/L (ref 0–44)
AST: 39 U/L (ref 15–41)
Albumin: 2.8 g/dL — ABNORMAL LOW (ref 3.5–5.0)
Alkaline Phosphatase: 189 U/L — ABNORMAL HIGH (ref 38–126)
Anion gap: 12 (ref 5–15)
BUN: 80 mg/dL — ABNORMAL HIGH (ref 8–23)
CO2: 22 mmol/L (ref 22–32)
Calcium: 8.5 mg/dL — ABNORMAL LOW (ref 8.9–10.3)
Chloride: 98 mmol/L (ref 98–111)
Creatinine, Ser: 2.73 mg/dL — ABNORMAL HIGH (ref 0.44–1.00)
GFR, Estimated: 18 mL/min — ABNORMAL LOW (ref >60)
Glucose, Bld: 324 mg/dL — ABNORMAL HIGH (ref 70–99)
Potassium: 5.6 mmol/L — ABNORMAL HIGH (ref 3.5–5.1)
Sodium: 132 mmol/L — ABNORMAL LOW (ref 135–145)
Total Bilirubin: 1.2 mg/dL (ref 0.3–1.2)
Total Protein: 5.7 g/dL — ABNORMAL LOW (ref 6.5–8.1)

## 2020-08-22 LAB — RESP PANEL BY RT-PCR (FLU A&B, COVID) ARPGX2
Influenza A by PCR: NEGATIVE
Influenza B by PCR: NEGATIVE
SARS Coronavirus 2 by RT PCR: NEGATIVE

## 2020-08-22 LAB — CBC WITH DIFFERENTIAL/PLATELET
Abs Immature Granulocytes: 0.05 10*3/uL (ref 0.00–0.07)
Basophils Absolute: 0.1 10*3/uL (ref 0.0–0.1)
Basophils Relative: 1 %
Eosinophils Absolute: 0.6 10*3/uL — ABNORMAL HIGH (ref 0.0–0.5)
Eosinophils Relative: 7 %
HCT: 36.3 % (ref 36.0–46.0)
Hemoglobin: 10.9 g/dL — ABNORMAL LOW (ref 12.0–15.0)
Immature Granulocytes: 1 %
Lymphocytes Relative: 10 %
Lymphs Abs: 1 10*3/uL (ref 0.7–4.0)
MCH: 26.5 pg (ref 26.0–34.0)
MCHC: 30 g/dL (ref 30.0–36.0)
MCV: 88.1 fL (ref 80.0–100.0)
Monocytes Absolute: 0.6 10*3/uL (ref 0.1–1.0)
Monocytes Relative: 7 %
Neutro Abs: 6.8 10*3/uL (ref 1.7–7.7)
Neutrophils Relative %: 74 %
Platelets: 174 10*3/uL (ref 150–400)
RBC: 4.12 MIL/uL (ref 3.87–5.11)
RDW: 17.9 % — ABNORMAL HIGH (ref 11.5–15.5)
WBC: 9.2 10*3/uL (ref 4.0–10.5)
nRBC: 0 % (ref 0.0–0.2)

## 2020-08-22 LAB — CBG MONITORING, ED
Glucose-Capillary: 304 mg/dL — ABNORMAL HIGH (ref 70–99)
Glucose-Capillary: 305 mg/dL — ABNORMAL HIGH (ref 70–99)

## 2020-08-22 LAB — C-REACTIVE PROTEIN: CRP: 0.7 mg/dL (ref <1.0)

## 2020-08-22 LAB — PHOSPHORUS: Phosphorus: 4.8 mg/dL — ABNORMAL HIGH (ref 2.5–4.6)

## 2020-08-22 LAB — MAGNESIUM: Magnesium: 2.2 mg/dL (ref 1.7–2.4)

## 2020-08-22 LAB — BRAIN NATRIURETIC PEPTIDE: B Natriuretic Peptide: 215.9 pg/mL — ABNORMAL HIGH (ref 0.0–100.0)

## 2020-08-22 MED ORDER — SODIUM CHLORIDE 0.9 % IV SOLN
1.0000 g | INTRAVENOUS | Status: DC
  Administered 2020-08-22: 1 g via INTRAVENOUS
  Filled 2020-08-22 (×2): qty 10

## 2020-08-22 MED ORDER — CALCIUM GLUCONATE-NACL 1-0.675 GM/50ML-% IV SOLN
1.0000 g | Freq: Once | INTRAVENOUS | Status: AC
  Administered 2020-08-22: 1000 mg via INTRAVENOUS
  Filled 2020-08-22: qty 50

## 2020-08-22 MED ORDER — NITROGLYCERIN 0.4 MG SL SUBL: 0.4000 mg | SUBLINGUAL_TABLET | SUBLINGUAL | Status: DC | PRN

## 2020-08-22 MED ORDER — CLOPIDOGREL BISULFATE 75 MG PO TABS
75.0000 mg | ORAL_TABLET | Freq: Every day | ORAL | Status: DC
  Administered 2020-08-22 – 2020-08-27 (×6): 75 mg via ORAL
  Filled 2020-08-22 (×6): qty 1

## 2020-08-22 MED ORDER — FERROUS SULFATE 325 (65 FE) MG PO TABS
325.0000 mg | ORAL_TABLET | Freq: Two times a day (BID) | ORAL | Status: DC
  Administered 2020-08-22 – 2020-08-27 (×12): 325 mg via ORAL
  Filled 2020-08-22 (×12): qty 1

## 2020-08-22 MED ORDER — SODIUM CHLORIDE 0.9 % IV SOLN
1.0000 g | Freq: Once | INTRAVENOUS | Status: AC
  Administered 2020-08-22: 1 g via INTRAVENOUS
  Filled 2020-08-22: qty 10

## 2020-08-22 MED ORDER — CARVEDILOL 12.5 MG PO TABS
12.5000 mg | ORAL_TABLET | Freq: Two times a day (BID) | ORAL | Status: DC
  Administered 2020-08-22 – 2020-08-27 (×12): 12.5 mg via ORAL
  Filled 2020-08-22 (×12): qty 1

## 2020-08-22 MED ORDER — ACETAMINOPHEN 325 MG PO TABS
650.0000 mg | ORAL_TABLET | Freq: Four times a day (QID) | ORAL | Status: DC | PRN
  Administered 2020-08-25 – 2020-08-26 (×4): 650 mg via ORAL
  Filled 2020-08-22 (×4): qty 2

## 2020-08-22 MED ORDER — INSULIN GLARGINE 100 UNIT/ML ~~LOC~~ SOLN
10.0000 [IU] | Freq: Every day | SUBCUTANEOUS | Status: DC
  Administered 2020-08-22: 10 [IU] via SUBCUTANEOUS
  Filled 2020-08-22 (×3): qty 0.1

## 2020-08-22 MED ORDER — SODIUM CHLORIDE 0.9 % IV SOLN: Freq: Once | INTRAVENOUS | Status: AC

## 2020-08-22 MED ORDER — HYDRALAZINE HCL 20 MG/ML IJ SOLN: 10.0000 mg | Freq: Four times a day (QID) | INTRAMUSCULAR | Status: DC | PRN

## 2020-08-22 MED ORDER — LEVOTHYROXINE SODIUM 50 MCG PO TABS
50.0000 ug | ORAL_TABLET | Freq: Every day | ORAL | Status: DC
  Administered 2020-08-22 – 2020-08-27 (×6): 50 ug via ORAL
  Filled 2020-08-22 (×6): qty 1

## 2020-08-22 MED ORDER — ASPIRIN EC 81 MG PO TBEC
81.0000 mg | DELAYED_RELEASE_TABLET | Freq: Every day | ORAL | Status: DC
  Administered 2020-08-22 – 2020-08-27 (×6): 81 mg via ORAL
  Filled 2020-08-22 (×6): qty 1

## 2020-08-22 MED ORDER — MAGNESIUM OXIDE 400 (241.3 MG) MG PO TABS
400.0000 mg | ORAL_TABLET | Freq: Every day | ORAL | Status: DC
  Administered 2020-08-22 – 2020-08-27 (×6): 400 mg via ORAL
  Filled 2020-08-22 (×6): qty 1

## 2020-08-22 MED ORDER — PREGABALIN 75 MG PO CAPS
75.0000 mg | ORAL_CAPSULE | Freq: Every day | ORAL | Status: DC
  Administered 2020-08-22 – 2020-08-27 (×6): 75 mg via ORAL
  Filled 2020-08-22 (×5): qty 1
  Filled 2020-08-22: qty 3

## 2020-08-22 MED ORDER — ENOXAPARIN SODIUM 40 MG/0.4ML ~~LOC~~ SOLN
40.0000 mg | SUBCUTANEOUS | Status: DC
  Administered 2020-08-22 – 2020-08-23 (×2): 40 mg via SUBCUTANEOUS
  Filled 2020-08-22 (×2): qty 0.4

## 2020-08-22 MED ORDER — INSULIN ASPART 100 UNIT/ML ~~LOC~~ SOLN
0.0000 [IU] | Freq: Three times a day (TID) | SUBCUTANEOUS | Status: DC
  Administered 2020-08-22 (×2): 11 [IU] via SUBCUTANEOUS
  Administered 2020-08-22: 8 [IU] via SUBCUTANEOUS
  Administered 2020-08-23: 11 [IU] via SUBCUTANEOUS
  Administered 2020-08-23: 5 [IU] via SUBCUTANEOUS
  Administered 2020-08-23: 15 [IU] via SUBCUTANEOUS
  Administered 2020-08-23: 11 [IU] via SUBCUTANEOUS
  Administered 2020-08-24: 15 [IU] via SUBCUTANEOUS
  Administered 2020-08-24: 3 [IU] via SUBCUTANEOUS
  Administered 2020-08-24: 8 [IU] via SUBCUTANEOUS
  Administered 2020-08-25: 11 [IU] via SUBCUTANEOUS
  Administered 2020-08-25: 5 [IU] via SUBCUTANEOUS
  Administered 2020-08-25: 15 [IU] via SUBCUTANEOUS
  Administered 2020-08-25 – 2020-08-26 (×2): 5 [IU] via SUBCUTANEOUS
  Administered 2020-08-26: 15 [IU] via SUBCUTANEOUS
  Administered 2020-08-26: 8 [IU] via SUBCUTANEOUS
  Administered 2020-08-26: 11 [IU] via SUBCUTANEOUS
  Administered 2020-08-27: 8 [IU] via SUBCUTANEOUS
  Administered 2020-08-27: 3 [IU] via SUBCUTANEOUS
  Administered 2020-08-27: 2 [IU] via SUBCUTANEOUS

## 2020-08-22 MED ORDER — SODIUM CHLORIDE 0.9 % IV SOLN: INTRAVENOUS | Status: DC

## 2020-08-22 MED ORDER — INSULIN ASPART 100 UNIT/ML IV SOLN
6.0000 [IU] | Freq: Once | INTRAVENOUS | Status: AC
  Administered 2020-08-22: 6 [IU] via INTRAVENOUS

## 2020-08-22 MED ORDER — ACETAMINOPHEN 650 MG RE SUPP: 650.0000 mg | Freq: Four times a day (QID) | RECTAL | Status: DC | PRN

## 2020-08-22 MED ORDER — URSODIOL 300 MG PO CAPS
300.0000 mg | ORAL_CAPSULE | Freq: Three times a day (TID) | ORAL | Status: DC
  Administered 2020-08-22 – 2020-08-27 (×17): 300 mg via ORAL
  Filled 2020-08-22 (×18): qty 1

## 2020-08-22 MED ORDER — POLYETHYLENE GLYCOL 3350 17 G PO PACK: 17.0000 g | PACK | Freq: Every day | ORAL | Status: DC | PRN

## 2020-08-22 MED ORDER — SODIUM ZIRCONIUM CYCLOSILICATE 10 G PO PACK
10.0000 g | PACK | Freq: Every day | ORAL | Status: DC
  Administered 2020-08-22 – 2020-08-23 (×2): 10 g via ORAL
  Filled 2020-08-22 (×2): qty 1

## 2020-08-22 NOTE — ED Notes (Signed)
Patient had incont. Episode. Patient placed on purewick.

## 2020-08-22 NOTE — ED Notes (Signed)
Bladder scan volume: 0 mL. 

## 2020-08-22 NOTE — ED Notes (Signed)
I&o 133mL

## 2020-08-22 NOTE — Telephone Encounter (Signed)
Received call from [facility] PT with [place] [phone]~ telephone.   Requested VOO for HH PT 2x weekly x3 weeks, then 1x weekly x4 weeks for balance, strength, endurance and gait.    VO given.   Of note, patient noted to be re-admitted to hospital for AKI

## 2020-08-22 NOTE — H&P (Signed)
History and Physical    Susan [name] UEA:[id number] DOB: May 11, 1951 DOA: 08/21/2020  PCP: [name], MD  Patient coming from: Home   Chief Complaint:  Chief Complaint  Patient presents with  . Abnormal Lab     HPI:    70 year old female with past medical history of diastolic congestive heart failure (Echo 03/2020 EF 55-60%), coronary artery disease (MI in 2000 and 2007 with Cath with BMS 2007), chronic kidney disease stage IV, hypertension, hyperlipidemia, hypothyroidism, pulmonary hypertension (mild, identified on RHC 07/2017) and peripheral neuropathy who presents to [facility] emergency department with complaints of being unable to urinate.  Of note, patient was recently hospitalized at [facility] from 3/22 until 3/26.  Patient was initially hospitalized for acute kidney injury as well as acute diastolic congestive heart failure.  Acute kidney injury paradoxically improved with diuretics.  Of note, patient also had a urinalysis that was abnormal with urine culture growing out pansensitive E. coli.  Patient was treated with a 3-day course of intravenous ceftriaxone.  Creatinine decreased from 4.17 on admission down to 1.78 on date of discharge.  Patient states that on day of discharge she felt near baseline except for some residual generalized weakness and fatigue with exertion.  Patient was taking her going regimen of diuretics as instructed as well as the remainder of her medications.    However, patient noticed that on 3/29, other than having a small amount of urine output early in the morning he did not need to urinate.  Patient went the entire day without needing to urinate and felt a generalized feeling of malaise.  By the afternoon, the patient's primary care provider office was contacted with this concern and she was instructed to go to the emergency department for evaluation.  Upon further questioning patient denies fevers, nausea, vomiting, diarrhea,  dysuria, cough, shortness of breath or contact with COVID-19 infection.  Upon evaluation in the emergency department patient was found to have recurrent acute kidney injury with creatinine rising up to 3.17, up from 1.78 on date of discharge.  Patient was administered 1 L of intravenous isotonic fluids.  Urinalysis was performed and was once again suggestive of a urinary tract infection and therefore patient was given a dose of intravenous ceftriaxone.  The hospitalist group was then called to assess the patient for mission to the hospital.  Review of Systems:   Review of Systems  Constitutional: Positive for malaise/fatigue.    Past Medical History:  Diagnosis Date  . Acute MI ([place]) 1999; 2007  . Anemia    hx  . Anginal pain ([place])   . Anxiety   . ARF (acute renal failure) ([place]) 06/2017   [facility]  . Arthritis    "generalized" (03/15/2014)  . CAD (coronary artery disease)    MI in 2000 - MI  2007 - treated bare metal stent (no nuclear since then as 9/11)  . Carotid artery disease ([place])   . CHF (congestive heart failure) ([place])   . Chronic diastolic heart failure (HCC)    a) ECHO (08/2013) EF 55-60% and RV function nl b) RHC (08/2013) RA 4, RV 30/5/7, PA 25/10 (16), PCWP 7, Fick CO/CI 6.3/2.7, PVR 1.5 WU, PA 61 and 66%  . Daily headache    "~ every other day; since I fell in June" (03/15/2014)  . Depression   . Dyslipidemia   . Dyspnea   . Exertional shortness of breath   . HTN (hypertension)   . Hypothyroidism   .  Neuropathy   . Obesity   . Osteoarthritis   . Peripheral neuropathy   . PONV (postoperative nausea and vomiting)   . RBBB (right bundle branch block)    Old  . Stroke [facility])    mini strokes  . Syncope    likely due to low blood sugar  . Tachycardia    Sinus tachycardia  . Type II diabetes mellitus (HCC)    Type II  . Urinary incontinence   . Venous insufficiency     Past Surgical History:  Procedure Laterality Date  . ABDOMINAL HYSTERECTOMY   1980's  . AMPUTATION Right 02/24/2018   Procedure: RIGHT FOOT GREAT TOE AND 2ND TOE AMPUTATION;  Surgeon: Newt Minion, MD;  Location: [place];  Service: Orthopedics;  Laterality: Right;  . AMPUTATION Right 04/30/2018   Procedure: RIGHT TRANSMETATARSAL AMPUTATION;  Surgeon: Newt Minion, MD;  Location: [place];  Service: Orthopedics;  Laterality: Right;  . BIOPSY  05/27/2020   Procedure: BIOPSY;  Surgeon: [name], DO;  [facility];  Service: Endoscopy;;  . CATARACT EXTRACTION, BILATERAL Bilateral ?2013  . COLONOSCOPY W/ POLYPECTOMY    . COLONOSCOPY WITH PROPOFOL N/A 03/13/2019   Procedure: COLONOSCOPY WITH PROPOFOL;  Surgeon: Jerene Bears, MD;  Location: [place];  Service: Gastroenterology;  Laterality: N/A;  . [place]; 2007   "1 + 1"  . ESOPHAGOGASTRODUODENOSCOPY (EGD) WITH PROPOFOL N/A 03/13/2019   Procedure: ESOPHAGOGASTRODUODENOSCOPY (EGD) WITH PROPOFOL;  Surgeon: Jerene Bears, MD;  Location: [facility] ENDOSCOPY;  Service: Gastroenterology;  Laterality: N/A;  . ESOPHAGOGASTRODUODENOSCOPY (EGD) WITH PROPOFOL N/A 05/27/2020   Procedure: ESOPHAGOGASTRODUODENOSCOPY (EGD) WITH PROPOFOL;  Surgeon: [name], DO;  [facility];  Service: Endoscopy;  Laterality: N/A;  . EYE SURGERY Bilateral    [place]  . HEMOSTASIS CLIP PLACEMENT  03/13/2019   Procedure: HEMOSTASIS CLIP PLACEMENT;  Surgeon: Jerene Bears, MD;  Location: [facility] ENDOSCOPY;  Service: Gastroenterology;;  . KNEE ARTHROSCOPY Left 10/25/2006  . POLYPECTOMY  03/13/2019   Procedure: POLYPECTOMY;  Surgeon: Jerene Bears, MD;  Location: [place];  Service: Gastroenterology;;  . RIGHT HEART CATH N/A 07/24/2017   Procedure: RIGHT HEART CATH;  Surgeon: Jolaine Artist, MD;  Location: [place] CV LAB;  Service: Cardiovascular;  Laterality: N/A;  . RIGHT HEART CATHETERIZATION N/A 09/22/2013   Procedure: RIGHT HEART CATH;  Surgeon: Jolaine Artist, MD;  Location:  [facility] CATH LAB;  Service: Cardiovascular;  Laterality: N/A;  . SHOULDER ARTHROSCOPY WITH OPEN ROTATOR CUFF REPAIR Right 03/14/2014   Procedure: RIGHT SHOULDER ARTHROSCOPY WITH BICEPS RELEASE, OPEN SUBSCAPULA REPAIR, OPEN SUPRASPINATUS REPAIR.;  Surgeon: Meredith Pel, MD;  Location: [place];  Service: Orthopedics;  Laterality: Right;  . TOE AMPUTATION Right 02/24/2018   GREAT TOE AND 2ND TOE AMPUTATION  . TUBAL LIGATION  1970's     reports that she quit smoking about 22 years ago. Her smoking use included cigarettes. She has a 96.00 pack-year smoking history. She has never used smokeless tobacco. She reports previous alcohol use. She reports that she does not use drugs.  Allergies  Allergen Reactions  . Codeine Nausea And Vomiting    Family History  Problem Relation Age of Onset  . Heart attack Mother 55     Prior to Admission medications   Medication Sig Start Date End Date Taking? Authorizing Provider  acetaminophen (TYLENOL) 500 MG tablet Take 1,000 mg by mouth every 6 (six) hours as needed  for moderate pain or headache.   Yes [provider]  albuterol (VENTOLIN HFA) 108 (90 Base) MCG/ACT inhaler TAKE 2 PUFFS BY MOUTH EVERY 6 HOURS AS NEEDED FOR WHEEZE OR SHORTNESS OF BREATH Patient taking differently: Inhale 2 puffs into the lungs every 6 (six) hours as needed for wheezing or shortness of breath. 06/27/19  Yes [place], [name], MD  allopurinol (ZYLOPRIM) 100 MG tablet TAKE 1 TABLET BY MOUTH TWICE A DAY Patient taking differently: Take 100 mg by mouth 2 (two) times daily. 08/02/19  Yes Newt Minion, MD  Ascorbic Acid (VITAMIN C) 1000 MG tablet Take 1,000 mg by mouth daily.   Yes [provider]  aspirin EC 81 MG tablet Take 1 tablet (81 mg total) by mouth daily with breakfast. 05/28/20  Yes Emokpae, Courage, MD  Bismuth Tribromoph-Petrolatum (XEROFORM PETROLAT GAUZE 5"X9") MISC Apply 1 each topically 2 (two) times daily. For dressing changes 05/28/20  Yes Emokpae,  Courage, MD  carvedilol (COREG) 12.5 MG tablet TAKE 1 TABLET (12.5 MG TOTAL) BY MOUTH 2 (TWO) TIMES DAILY WITH A MEAL. 08/13/20  Yes [name], MD  cholecalciferol (VITAMIN D) 1000 units tablet Take 1,000 Units by mouth daily with supper.    Yes [provider]  clopidogrel (PLAVIX) 75 MG tablet TAKE 1 TABLET BY MOUTH DAILY WITH BREAKFAST. 08/15/20  Yes Clegg, Amy D, NP  ferrous sulfate 325 (65 FE) MG tablet Take 1 tablet (325 mg total) by mouth 2 (two) times daily with a meal. Reported on 08/21/2015 06/07/18  Yes [place], [name], MD  FLUoxetine (PROZAC) 20 MG capsule Take 10 mg by mouth daily.   Yes [provider]  guaiFENesin-dextromethorphan (ROBITUSSIN DM) 100-10 MG/5ML syrup Take 10 mLs by mouth every 4 (four) hours as needed for cough. 03/13/20  Yes [place] Eric J, DO  hydrocerin (EUCERIN) CREA Apply 1 application topically 2 (two) times daily. 08/19/20  Yes Lavina Hamman, MD  Insulin Disposable Pump (OMNIPOD DASH 5 PACK PODS) MISC Inject into the skin See admin instructions. Use continuously with Novolin R - change every 72 hours. 11/18/18  Yes [provider]  insulin NPH Human (HUMULIN N,NOVOLIN N) 100 UNIT/ML injection Inject 15 Units into the skin See admin instructions. Inject 15 units subcutaneously in the morning if CBG >300;   Yes [provider]  insulin regular (NOVOLIN R) 100 units/mL injection Inject into the skin See admin instructions. Manually add bolus to continuous dose via OmniPod 3 times daily per sliding scale (CBG 80-150 7 units, 151-200 9 units, 201-250 12 units, 251-300 14 units, 301- 400 17 units)   Yes [provider]  levothyroxine (SYNTHROID, LEVOTHROID) 50 MCG tablet Take 1 tablet (50 mcg total) by mouth daily before breakfast. 11/07/16  Yes Dixon, Mary B, PA-C  magnesium oxide (MAG-OX) 400 MG tablet TAKE 1 TABLET BY MOUTH EVERY DAY Patient taking differently: Take 400 mg by mouth daily. 07/23/20  Yes [place], [name], MD  miconazole (MICOTIN) 2 % powder Apply topically as needed for itching. 08/20/20  Yes [name], MD  Multiple Vitamins-Minerals (AIRBORNE GUMMIES PO) Take 2 tablets by mouth every evening.   Yes [provider]  Multiple Vitamins-Minerals (CENTRUM SILVER 50+WOMEN) TABS Take 1 tablet by mouth every evening.   Yes [provider]  Multiple Vitamins-Minerals (ZINC PO) Take 1 tablet by mouth every evening.   Yes [provider]  nitroGLYCERIN (NITROSTAT) 0.4 MG SL tablet Place 1 tablet (0.4 mg total) under  the tongue every 5 (five) minutes as needed for chest pain. 04/28/19  Yes Clegg, Amy D, NP  ondansetron (ZOFRAN) 4 MG tablet Take 1 tablet (4 mg total) by mouth every 8 (eight) hours as needed for nausea or vomiting. 05/28/20 05/28/21 Yes Roxan Hockey, MD  [facility] VERIO test strip 3 (three) times daily. 07/25/20  Yes [provider]  polyethylene glycol (MIRALAX / GLYCOLAX) 17 g packet Take 17 g by mouth daily. 08/19/20  Yes Lavina Hamman, MD  potassium chloride SA (KLOR-CON) 20 MEQ tablet Take 20 mEq by mouth daily.   Yes [provider]  pregabalin (LYRICA) 150 MG capsule Take 1 capsule (150 mg total) by mouth 2 (two) times daily. 11/12/16  Yes [place], [name], MD  torsemide (DEMADEX) 20 MG tablet Take 4 tablets (80 mg total) by mouth 2 (two) times daily. Take 100 mg bid on the days you see weight gain of 3lbs. 08/19/20  Yes Lavina Hamman, MD  traZODone (DESYREL) 100 MG tablet TAKE 1 AND 1/2 TABLETS BY MOUTH AT BEDTIME AS NEEDED FOR SLEEP. Patient taking differently: Take 200 mg by mouth at bedtime. 01/26/20  Yes [place], [name], MD  ursodiol (ACTIGALL) 500 MG tablet Take 500 mg by mouth 3 (three) times daily. 09/09/19  Yes [provider]    Physical Exam: Vitals:   08/21/20 2245 08/21/20 2330 08/22/20 0015 08/22/20 0230  BP: (!) 141/76 (!) 174/68 (!) 157/72 (!) 141/59  Pulse: 85 89 81 81  Resp: 13 16 13 16   Temp:       TempSrc:      SpO2: 96% 95% 97% 93%    Constitutional: Awake alert and oriented x3, no associated distress.   Skin: no rashes, no lesions, good skin turgor noted. Eyes: Pupils are equally reactive to light.  No evidence of scleral icterus or conjunctival pallor.  ENMT: Moist mucous membranes noted.  Posterior pharynx clear of any exudate or lesions.   Neck: normal, supple, no masses, no thyromegaly.  No evidence of jugular venous distension.   Respiratory: clear to auscultation bilaterally, no wheezing, no crackles. Normal respiratory effort. No accessory muscle use.  Cardiovascular: Regular rate and rhythm, no murmurs / rubs / gallops. No extremity edema. 2+ pedal pulses. No carotid bruits.  Chest:   Nontender without crepitus or deformity.   Back: Notable right flank tenderness.  No evidence of crepitus or deformity. Abdomen: Abdomen is soft and nontender.  No evidence of intra-abdominal masses.  Positive bowel sounds noted in all quadrants.   Musculoskeletal: No joint deformity upper and lower extremities. Good ROM, no contractures. Normal muscle tone.  Neurologic: CN 2-12 grossly intact. Sensation intact.  Patient moving all 4 extremities spontaneously.  Patient is following all commands.  Patient is responsive to verbal stimuli.   Psychiatric: Patient exhibits normal mood with appropriate affect.  Patient seems to possess insight as to their current situation.     Labs on Admission: I have personally reviewed following labs and imaging studies -   CBC: Recent Labs  Lab 08/21/20 1948  WBC 11.1*  HGB 11.7*  HCT 37.6  MCV 86.2  PLT 865   Basic Metabolic Panel: Recent Labs  Lab 08/16/20 0428 08/17/20 0532 08/18/20 0242 08/19/20 0243 08/20/20 1527 08/21/20 1948  NA 137 140 137 136 135 134*  K 4.2 4.3 3.9 4.3 5.6* 5.2*  CL 104 103 101 101 96* 98  CO2 26 28 28 27 23 26   GLUCOSE 301* 174* 229* 262*  362* 102*  BUN 79* 69* 59* 57* 67* 76*  CREATININE 2.22* 1.89* 1.78* 1.84*  2.57* 3.17*  CALCIUM 8.9 8.9 9.0 8.8* 9.2 9.0  MG 2.1  --  1.8 1.9  --   --    GFR: Estimated Creatinine Clearance: 21 mL/min (A) (by C-G formula based on SCr of 3.17 mg/dL (H)). Liver Function Tests: Recent Labs  Lab 08/21/20 1948  AST 23  ALT 16  ALKPHOS 207*  BILITOT 0.5  PROT 6.3*  ALBUMIN 3.2*   Recent Labs  Lab 08/21/20 1948  LIPASE 34   No results for input(s): AMMONIA in the last 168 hours. Coagulation Profile: No results for input(s): INR, PROTIME in the last 168 hours. Cardiac Enzymes: No results for input(s): CKTOTAL, CKMB, CKMBINDEX, TROPONINI in the last 168 hours. BNP (last 3 results) No results for input(s): PROBNP in the last 8760 hours. HbA1C: No results for input(s): HGBA1C in the last 72 hours. CBG: Recent Labs  Lab 08/18/20 1148 08/18/20 1632 08/18/20 2108 08/19/20 0640 08/19/20 1140  GLUCAP 214* 332* 174* 217* 234*   Lipid Profile: No results for input(s): CHOL, HDL, LDLCALC, TRIG, CHOLHDL, LDLDIRECT in the last 72 hours. Thyroid Function Tests: No results for input(s): TSH, T4TOTAL, FREET4, T3FREE, THYROIDAB in the last 72 hours. Anemia Panel: No results for input(s): VITAMINB12, FOLATE, FERRITIN, TIBC, IRON, RETICCTPCT in the last 72 hours. Urine analysis:    Component Value Date/Time   COLORURINE YELLOW 08/22/2020 0100   APPEARANCEUR HAZY (A) 08/22/2020 0100   LABSPEC 1.011 08/22/2020 0100   PHURINE 5.0 08/22/2020 0100   GLUCOSEU NEGATIVE 08/22/2020 0100   HGBUR NEGATIVE 08/22/2020 0100   BILIRUBINUR NEGATIVE 08/22/2020 0100   BILIRUBINUR negative 06/05/2020 1334   KETONESUR NEGATIVE 08/22/2020 0100   PROTEINUR 100 (A) 08/22/2020 0100   UROBILINOGEN 0.2 06/05/2020 1334   UROBILINOGEN 0.2 04/01/2014 1659   NITRITE NEGATIVE 08/22/2020 0100   LEUKOCYTESUR LARGE (A) 08/22/2020 0100    Radiological Exams on Admission - Personally Reviewed: DG Chest Portable 1 View  Result Date: 08/21/2020 CLINICAL DATA:  Congestive heart failure  EXAM: PORTABLE CHEST 1 VIEW COMPARISON:  08/13/2020 FINDINGS: The heart size and mediastinal contours are within normal limits. Both lungs are clear. The visualized skeletal structures are unremarkable. IMPRESSION: No active disease. Electronically Signed   By: Fidela Salisbury MD   On: 08/21/2020 23:01    EKG: Personally reviewed.  Rhythm is sinus rhythm with heart rate of 78 bpm.  Right bundle branch block.  No dynamic ST segment changes appreciated.  Assessment/Plan Principal Problem:   Acute renal failure superimposed on stage 4 chronic kidney disease ([place])   Patient presenting with sudden decrease in urine output over the past 24 hours.  Additionally, patient has lower abdominal tenderness as well as right flank tenderness on examination.  Urinalysis here is once again suggestive of urinary tract infection with a slight leukocytosis of 11.1.  Etiology of acute kidney injury is unclear.  Possible volume depletion due to regimen of outpatient diuretics.  Possible undertreated urinary tract infection.  With right flank tenderness I am also concerned about a possible right ureteral stone.  While an abnormal urinalysis alone is not definitive evidence of urinary tract infection, will place  patient back on intravenous ceftriaxone for now considering acute kidney injury and flank pain.  Hydrating patient gently with intravenous isotonic fluid  Obtaining CT imaging of the abdomen pelvis to evaluate for any evidence of postobstructive uropathy or pyelonephritis.  Avoiding nephrotoxic agents if  at all possible  Serial chemistries to monitor renal function and electrolytes  Active Problems: E. coli UTI   See assessment and plan above.   Hyperkalemia   Patient exhibiting mild hyperkalemia in the setting of acute kidney injury  I fully expect this mild hyperkalemia to quickly resolved with intravenous volume resuscitation  Monitor patient on telemetry  No EKG evidence of  hyperkalemia  Serial chemistries to monitor potassium closely.    Essential hypertension  . Resume patients home regimen or oral antihypertensives . Titrate antihypertensive regimen as necessary to achieve adequate BP control . PRN intravenous antihypertensives for excessively elevated blood pressure    Coronary artery disease involving native coronary artery of native heart without angina pectoris  . Patient is currently chest pain free . Monitoring patient on telemetry . Continue home regimen of antiplatelet therapy, lipid lowering therapy and AV nodal blocking therapy    Chronic diastolic CHF (congestive heart failure) ([place])   Absolutely no clinical evidence of cardiogenic volume overload at this time.  Temporarily holding diuretics as we gently hydrate the patient.  Monitoring patient closely for evidence of recurrent volume overload.    Prolonged QT interval   Extremely prolonged QT noted on EKG  Discontinuing patient's QT prolonging agents for now  Monitoring patient on telemetry  Serial EKGs  Monitoring electrolytes and correcting deficiencies as necessary    Type 2 diabetes mellitus with diabetic polyneuropathy, with long-term current use of insulin (HCC)   Placing patient on Lantus every morning  Accu-Cheks before every meal and nightly sliding scale insulin  Recent hemoglobin A1c performed earlier this month is 11% suggestive extremely poor control.  Code Status:  Full code Family Communication: deferred   Status is: Observation  The patient remains OBS appropriate and will d/c before 2 midnights.  Dispo: The patient is from: Home              Anticipated d/c is to: Home              Patient currently is not medically stable to d/c.   Difficult to place patient No        Vernelle Emerald MD [facility] Pager [phone]  If 7PM-7AM, please contact night-coverage [email or web address] Use universal [place] password for that web site.  If you do not have the password, please call the hospital operator.  08/22/2020, 4:55 AM

## 2020-08-22 NOTE — ED Provider Notes (Signed)
Nursing staff repeated EKG due to the change in morphology on the monitor.  EKG reviewed showing bundle branch block pattern.  Overall similar to 1 this morning however old EKG reviewed was narrow complexes.  Patient does have elevated potassium on previous lab work.  Point-of-care glucose 300s reviewed.  IV insulin and calcium ordered.  Discussed with nursing to notify hospitalist.  Patient will be continue to monitor on telemetry.  Hospitalist had ordered [facility] however has not been given yet.  Golda Acre, MD 08/22/20 [phone]

## 2020-08-22 NOTE — Progress Notes (Signed)
Inpatient Diabetes Program Recommendations  AACE/ADA: New Consensus Statement on Inpatient Glycemic Control (2015)  Target Ranges:  Prepandial:   less than 140 mg/dL      Peak postprandial:   less than 180 mg/dL (1-2 hours)      Critically ill patients:  140 - 180 mg/dL   Lab Results  Component Value Date   GLUCAP 305 (H) 08/22/2020   HGBA1C 11.0 (H) 08/14/2020    Review of Glycemic Control  Diabetes history: DM2  Home DM Meds: NPH Insulin 15 units QAM if CBG >300                             NPH Insulin 10 units QPM if CBG >200                             Omni Pod Insulin Pump (Total Basal Rate 32.2 units + Uses Set Dose Boluses 8-17 units)  Current Orders: Lantus 10 units QD, Novolog 0-15 units TID AC + HS  Recommendations:  Levemir 15 units BID (d/c Lantus) Decrease Novolog to 0-6 units TID with meals and 0-5 HS Novolog 6 units TID with meals if eating > 50% meal  Sees Dr Suzette Battiest for Endo HgbA1C - 11%, but likely not accurate with low H/H Seen by Diabetes Coordinators on previous visit.  Follow glucose trends.  Thank you. Lorenda Peck, RD, LDN, CDE Inpatient Diabetes Coordinator [phone]

## 2020-08-22 NOTE — ED Notes (Signed)
Report given to [place], [place] of [phone]

## 2020-08-22 NOTE — ED Notes (Signed)
Attempted to call report to floor. RN informed that there were no nurse who weren't on break that could take report.

## 2020-08-22 NOTE — Progress Notes (Addendum)
PROGRESS NOTE    Susan [name]  YBO:[id number] DOB: April 16, 1951 DOA: 08/21/2020 PCP: [name], MD  Brief Narrative:70/F with history of chronic diastolic CHF, CAD with PCI and stents, CKD stage IV, hypertension, pulmonary hypertension, peripheral neuropathy, recently hospitalized from 3/22-3/26 secondary to diastolic CHF and AKI , improved with diuretics creatinine improved from 4.1 on admission down to 1.78 at discharge, she felt well for the first few days, on 3/29 noticed that her urine output was low, also had labs done by her PCPs office which noted worsening creatinine and was advised to come to the ED. -In the emergency room she was noted to have acute kidney injury with creatinine trending up to 3.1 from 1.73 days ago, urinalysis was abnormal, she started on IV fluids and IV ceftriaxone and admitted                                                                                           Assessment & Plan:   AKI on CKD4 Hyperkalemia -Suspect she had ATN last admission and had resolving ATN at the time of discharge, creatinine trended up to 3.1 from 1.8 few days after discharge -Baseline creatinine around 1.5-2 range -CT without hydronephrosis -Given IV fluids on admission, hold off on further hydration -Hold diuretics today -Monitor urine output and BMP closely -Lokelma, calcium gluconate, insulin for hyper kalemia now, repeat BMP in pm  Chronic diastolic CHF -Last echo with preserved EF 27-78%, grade 1 diastolic dysfunction -Diuresed well last week and discharged home on torsemide which is currently on hold  Abnormal UA -many bacteria -Continue IV ceftriaxone today, follow-up urine cultures  History of CAD -Prior history of PCI, BMS in 2007 and low risk nuclear study in 2018 -Continue aspirin, Plavix, carvedilol  Uncontrolled type 1 diabetes mellitus -Discontinue insulin pump -Continue Lantus, NovoLog with meals -Diabetes coordinator consult  History of  transmetatarsal amputation  Prolonged QTC -Caution with meds -EKG in a.m., monitor on telemetry  DVT prophylaxis:lovenox Code Status: Full Code Family Communication:none at bedside Disposition Plan:  Status is: Inpatient  Remains inpatient appropriate because:Inpatient level of care appropriate due to severity of illness   Dispo: The patient is from: Home              Anticipated d/c is to: Home              Patient currently is not medically stable to d/c.   Difficult to place patient No  Consultants   Procedures:   Antimicrobials:    Subjective: -feels ok, no specific complaints, urinating well now  Objective: Vitals:   08/22/20 1230 08/22/20 1300 08/22/20 1315 08/22/20 1400  BP: (!) 144/118 122/63 (!) 142/66 136/69  Pulse: 85 86 83 68  Resp: (!) 9 14 11 15   Temp:      TempSrc:      SpO2: 95% 96% 96% 95%    Intake/Output Summary (Last 24 hours) at 08/22/2020 1448 Last data filed at 08/22/2020 1200 Gross per 24 hour  Intake 200 ml  Output 1225 ml  Net -1025 ml   There were no vitals filed for this visit.  Examination:  General exam: Pleasant chronically ill female sitting up in bed, AAOx3 HEENT: Neck obese unable to assess JVD CVS: S1-S2, regular rate rhythm Lungs: Rare basilar rales, otherwise clear Abdomen: Soft, obese, nontender, bowel sounds present Extremities: Trace edema, bilateral venous stasis changes, right TMT Psychiatry: Mood & affect appropriate.   Data Reviewed:   CBC: Recent Labs  Lab 08/21/20 1948 08/22/20 0602  WBC 11.1* 9.2  NEUTROABS  --  6.8  HGB 11.7* 10.9*  HCT 37.6 36.3  MCV 86.2 88.1  PLT 224 045   Basic Metabolic Panel: Recent Labs  Lab 08/16/20 0428 08/17/20 0532 08/18/20 0242 08/19/20 0243 08/20/20 1527 08/21/20 1948 08/22/20 0602  NA 137   < > 137 136 135 134* 132*  K 4.2   < > 3.9 4.3 5.6* 5.2* 5.6*  CL 104   < > 101 101 96* 98 98  CO2 26   < > 28 27 23 26 22   GLUCOSE 301*   < > 229* 262* 362* 102*  324*  BUN 79*   < > 59* 57* 67* 76* 80*  CREATININE 2.22*   < > 1.78* 1.84* 2.57* 3.17* 2.73*  CALCIUM 8.9   < > 9.0 8.8* 9.2 9.0 8.5*  MG 2.1  --  1.8 1.9  --   --  2.2  PHOS  --   --   --   --   --   --  4.8*   < > = values in this interval not displayed.   GFR: Estimated Creatinine Clearance: 24.4 mL/min (A) (by C-G formula based on SCr of 2.73 mg/dL (H)). Liver Function Tests: Recent Labs  Lab 08/21/20 1948 08/22/20 0602  AST 23 39  ALT 16 9  ALKPHOS 207* 189*  BILITOT 0.5 1.2  PROT 6.3* 5.7*  ALBUMIN 3.2* 2.8*   Recent Labs  Lab 08/21/20 1948  LIPASE 34   No results for input(s): AMMONIA in the last 168 hours. Coagulation Profile: No results for input(s): INR, PROTIME in the last 168 hours. Cardiac Enzymes: No results for input(s): CKTOTAL, CKMB, CKMBINDEX, TROPONINI in the last 168 hours. BNP (last 3 results) No results for input(s): PROBNP in the last 8760 hours. HbA1C: No results for input(s): HGBA1C in the last 72 hours. CBG: Recent Labs  Lab 08/18/20 2108 08/19/20 0640 08/19/20 1140 08/22/20 0756 08/22/20 1203  GLUCAP 174* 217* 234* 304* 305*   Lipid Profile: No results for input(s): CHOL, HDL, LDLCALC, TRIG, CHOLHDL, LDLDIRECT in the last 72 hours. Thyroid Function Tests: No results for input(s): TSH, T4TOTAL, FREET4, T3FREE, THYROIDAB in the last 72 hours. Anemia Panel: No results for input(s): VITAMINB12, FOLATE, FERRITIN, TIBC, IRON, RETICCTPCT in the last 72 hours. Urine analysis:    Component Value Date/Time   COLORURINE YELLOW 08/22/2020 0100   APPEARANCEUR HAZY (A) 08/22/2020 0100   LABSPEC 1.011 08/22/2020 0100   PHURINE 5.0 08/22/2020 0100   GLUCOSEU NEGATIVE 08/22/2020 0100   HGBUR NEGATIVE 08/22/2020 0100   BILIRUBINUR NEGATIVE 08/22/2020 0100   BILIRUBINUR negative 06/05/2020 1334   KETONESUR NEGATIVE 08/22/2020 0100   PROTEINUR 100 (A) 08/22/2020 0100   UROBILINOGEN 0.2 06/05/2020 1334   UROBILINOGEN 0.2 04/01/2014 1659    NITRITE NEGATIVE 08/22/2020 0100   LEUKOCYTESUR LARGE (A) 08/22/2020 0100   Sepsis Labs: @LABRCNTIP (procalcitonin:4,lacticidven:4)  ) Recent Results (from the past 240 hour(s))  SARS CORONAVIRUS 2 (TAT 6-24 HRS) Nasopharyngeal Nasopharyngeal Swab     Status: None   Collection Time: 08/14/20 12:20 AM   Specimen: Nasopharyngeal  Swab  Result Value Ref Range Status   SARS Coronavirus 2 NEGATIVE NEGATIVE Final    Comment: (NOTE) SARS-CoV-2 target nucleic acids are NOT DETECTED.  The SARS-CoV-2 RNA is generally detectable in upper and lower respiratory specimens during the acute phase of infection. Negative results do not preclude SARS-CoV-2 infection, do not rule out co-infections with other pathogens, and should not be used as the sole basis for treatment or other patient management decisions. Negative results must be combined with clinical observations, patient history, and epidemiological information. The expected result is Negative.  Fact Sheet for Patients: SugarRoll.be  Fact Sheet for Healthcare Providers: https://www.woods-mathews.com/  This test is not yet approved or cleared by the [place] FDA and  has been authorized for detection and/or diagnosis of SARS-CoV-2 by FDA under an Emergency Use Authorization (EUA). This EUA will remain  in effect (meaning this test can be used) for the duration of the COVID-19 declaration under Se ction 564(b)(1) of the Act, 21 U.S.C. section 360bbb-3(b)(1), unless the authorization is terminated or revoked sooner.  Performed at [facility], [place] [address] [place], [place] [address]   Culture, Urine     Status: Abnormal   Collection Time: 08/14/20  5:30 AM   Specimen: Urine, Random  Result Value Ref Range Status   Specimen Description URINE, RANDOM  Final   Special Requests   Final    NONE Performed at [facility], [place] [address] [place], [place] [address]    Culture  >=100,000 COLONIES/mL ESCHERICHIA COLI (A)  Final   Report Status 08/16/2020 FINAL  Final   Organism ID, Bacteria ESCHERICHIA COLI (A)  Final      Susceptibility   Escherichia coli - MIC*    AMPICILLIN 8 SENSITIVE Sensitive     CEFAZOLIN <=4 SENSITIVE Sensitive     CEFEPIME <=0.12 SENSITIVE Sensitive     CEFTRIAXONE <=0.25 SENSITIVE Sensitive     CIPROFLOXACIN <=0.25 SENSITIVE Sensitive     GENTAMICIN <=1 SENSITIVE Sensitive     IMIPENEM <=0.25 SENSITIVE Sensitive     NITROFURANTOIN <=16 SENSITIVE Sensitive     TRIMETH/SULFA <=20 SENSITIVE Sensitive     AMPICILLIN/SULBACTAM <=2 SENSITIVE Sensitive     PIP/TAZO <=4 SENSITIVE Sensitive     * >=100,000 COLONIES/mL ESCHERICHIA COLI  Resp Panel by RT-PCR (Flu A&B, Covid) Nasopharyngeal Swab     Status: None   Collection Time: 08/21/20 11:06 PM   Specimen: Nasopharyngeal Swab; Nasopharyngeal(NP) swabs in vial transport medium  Result Value Ref Range Status   SARS Coronavirus 2 by RT PCR NEGATIVE NEGATIVE Final    Comment: (NOTE) SARS-CoV-2 target nucleic acids are NOT DETECTED.  The SARS-CoV-2 RNA is generally detectable in upper respiratory specimens during the acute phase of infection. The lowest concentration of SARS-CoV-2 viral copies this assay can detect is 138 copies/mL. A negative result does not preclude SARS-Cov-2 infection and should not be used as the sole basis for treatment or other patient management decisions. A negative result may occur with  improper specimen collection/handling, submission of specimen other than nasopharyngeal swab, presence of viral mutation(s) within the areas targeted by this assay, and inadequate number of viral copies(<138 copies/mL). A negative result must be combined with clinical observations, patient history, and epidemiological information. The expected result is Negative.  Fact Sheet for Patients:  EntrepreneurPulse.com.au  Fact Sheet for Healthcare Providers:   IncredibleEmployment.be  This test is no t yet approved or cleared by the [place] FDA and  has been  authorized for detection and/or diagnosis of SARS-CoV-2 by FDA under an Emergency Use Authorization (EUA). This EUA will remain  in effect (meaning this test can be used) for the duration of the COVID-19 declaration under Section 564(b)(1) of the Act, 21 U.S.C.section 360bbb-3(b)(1), unless the authorization is terminated  or revoked sooner.       Influenza A by PCR NEGATIVE NEGATIVE Final   Influenza B by PCR NEGATIVE NEGATIVE Final    Comment: (NOTE) The Xpert Xpress SARS-CoV-2/FLU/RSV plus assay is intended as an aid in the diagnosis of influenza from Nasopharyngeal swab specimens and should not be used as a sole basis for treatment. Nasal washings and aspirates are unacceptable for Xpert Xpress SARS-CoV-2/FLU/RSV testing.  Fact Sheet for Patients: EntrepreneurPulse.com.au  Fact Sheet for Healthcare Providers: IncredibleEmployment.be  This test is not yet approved or cleared by the [place] FDA and has been authorized for detection and/or diagnosis of SARS-CoV-2 by FDA under an Emergency Use Authorization (EUA). This EUA will remain in effect (meaning this test can be used) for the duration of the COVID-19 declaration under Section 564(b)(1) of the Act, 21 U.S.C. section 360bbb-3(b)(1), unless the authorization is terminated or revoked.  Performed at [facility], [place] [address] [place], [place] [address]      Radiology Studies: CT ABDOMEN PELVIS WO CONTRAST  Result Date: 08/22/2020 CLINICAL DATA:  Flank pain, urinary tract infection EXAM: CT ABDOMEN AND PELVIS WITHOUT CONTRAST TECHNIQUE: Multidetector CT imaging of the abdomen and pelvis was performed following the standard protocol without IV contrast. COMPARISON:  None. FINDINGS: Lower chest: Trace interstitial pulmonary edema within the visualized  lung bases. No pleural effusion. Right coronary artery stenting has been performed. Cardiac size is mildly enlarged. Hypoattenuation of the cardiac blood pool is in keeping with at least mild anemia. Hepatobiliary: Cholelithiasis noted without pericholecystic inflammatory change identified. No intra or extrahepatic biliary ductal dilation. Liver unremarkable. Pancreas: Unremarkable Spleen: Scattered benign calcified granuloma are seen within the spleen in keeping with old granulomatous disease. The spleen is otherwise unremarkable. Adrenals/Urinary Tract: Adrenal glands are unremarkable. The kidneys are normal in size and position. Linear calcifications within the renal hila likely represent vascular calcifications. No definite calcifications within the renal collecting system. No hydronephrosis. No perinephric fluid collections. The bladder is unremarkable. Stomach/Bowel: Moderate sigmoid diverticulosis. Scattered diverticular noted within the remainder of the colon. Moderate stool throughout the colon without evidence of obstruction. Endoscopic clip noted within the a mid body of the stomach. The stomach, small bowel, and large bowel are otherwise unremarkable. Appendix normal. No free intraperitoneal gas or fluid. Vascular/Lymphatic: Moderate aortoiliac atherosclerotic calcification. No aortic aneurysm. No pathologic adenopathy within the abdomen and pelvis. Reproductive: Status post hysterectomy. No adnexal masses. Other: No abdominal wall hernia.  Rectum unremarkable. Musculoskeletal: Osseous structures are age-appropriate. No acute bone abnormality. No lytic or blastic bone lesions are seen. IMPRESSION: No acute intra-abdominal pathology identified. No definite radiographic explanation for the patient's reported symptoms. Unremarkable noncontrast examination of the kidneys and renal collecting system. No nephro or urolithiasis. Cholelithiasis. Moderate sigmoid diverticulosis. Moderate stool throughout the  colon without evidence of obstruction. Aortic Atherosclerosis (ICD10-I70.0). Electronically Signed   By: Fidela Salisbury MD   On: 08/22/2020 05:25   DG Chest Portable 1 View  Result Date: 08/21/2020 CLINICAL DATA:  Congestive heart failure EXAM: PORTABLE CHEST 1 VIEW COMPARISON:  08/13/2020 FINDINGS: The heart size and mediastinal contours are within normal limits. Both lungs are clear. The visualized skeletal structures are unremarkable. IMPRESSION: No  active disease. Electronically Signed   By: Fidela Salisbury MD   On: 08/21/2020 23:01        Scheduled Meds: . aspirin EC  81 mg Oral Q breakfast  . carvedilol  12.5 mg Oral BID WC  . clopidogrel  75 mg Oral Q breakfast  . enoxaparin (LOVENOX) injection  40 mg Subcutaneous Q24H  . ferrous sulfate  325 mg Oral BID WC  . insulin aspart  0-15 Units Subcutaneous TID AC & HS  . insulin glargine  10 Units Subcutaneous Daily  . levothyroxine  50 mcg Oral Q0600  . magnesium oxide  400 mg Oral Daily  . pregabalin  75 mg Oral Daily  . sodium zirconium cyclosilicate  10 g Oral Daily  . ursodiol  300 mg Oral TID   Continuous Infusions: . sodium chloride Stopped (08/22/20 0907)  . cefTRIAXone (ROCEPHIN)  IV       LOS: 0 days    Time spent: 48min  Domenic Polite, MD [facility] 08/22/2020, 2:48 PM

## 2020-08-23 ENCOUNTER — Ambulatory Visit: Payer: HMO | Admitting: Orthopedic Surgery

## 2020-08-23 LAB — BASIC METABOLIC PANEL
Anion gap: 8 (ref 5–15)
BUN: 73 mg/dL — ABNORMAL HIGH (ref 8–23)
CO2: 26 mmol/L (ref 22–32)
Calcium: 8.8 mg/dL — ABNORMAL LOW (ref 8.9–10.3)
Chloride: 103 mmol/L (ref 98–111)
Creatinine, Ser: 2.5 mg/dL — ABNORMAL HIGH (ref 0.44–1.00)
GFR, Estimated: 20 mL/min — ABNORMAL LOW (ref >60)
Glucose, Bld: 274 mg/dL — ABNORMAL HIGH (ref 70–99)
Potassium: 4.7 mmol/L (ref 3.5–5.1)
Sodium: 137 mmol/L (ref 135–145)

## 2020-08-23 LAB — CBC
HCT: 33.1 % — ABNORMAL LOW (ref 36.0–46.0)
Hemoglobin: 10.3 g/dL — ABNORMAL LOW (ref 12.0–15.0)
MCH: 27.3 pg (ref 26.0–34.0)
MCHC: 31.1 g/dL (ref 30.0–36.0)
MCV: 87.8 fL (ref 80.0–100.0)
Platelets: 161 10*3/uL (ref 150–400)
RBC: 3.77 MIL/uL — ABNORMAL LOW (ref 3.87–5.11)
RDW: 17.8 % — ABNORMAL HIGH (ref 11.5–15.5)
WBC: 7.1 10*3/uL (ref 4.0–10.5)
nRBC: 0 % (ref 0.0–0.2)

## 2020-08-23 LAB — GLUCOSE, CAPILLARY
Glucose-Capillary: 321 mg/dL — ABNORMAL HIGH (ref 70–99)
Glucose-Capillary: 309 mg/dL — ABNORMAL HIGH (ref 70–99)
Glucose-Capillary: 373 mg/dL — ABNORMAL HIGH (ref 70–99)
Glucose-Capillary: 224 mg/dL — ABNORMAL HIGH (ref 70–99)

## 2020-08-23 MED ORDER — INSULIN ASPART 100 UNIT/ML IV SOLN
3.0000 [IU] | Freq: Three times a day (TID) | INTRAVENOUS | Status: DC
  Administered 2020-08-23 (×2): 3 [IU] via SUBCUTANEOUS

## 2020-08-23 MED ORDER — CEPHALEXIN 500 MG PO CAPS
500.0000 mg | ORAL_CAPSULE | Freq: Two times a day (BID) | ORAL | Status: DC
  Administered 2020-08-23 – 2020-08-24 (×2): 500 mg via ORAL
  Filled 2020-08-23 (×2): qty 1

## 2020-08-23 MED ORDER — INSULIN GLARGINE 100 UNIT/ML ~~LOC~~ SOLN
20.0000 [IU] | Freq: Every day | SUBCUTANEOUS | Status: DC
  Administered 2020-08-23: 20 [IU] via SUBCUTANEOUS
  Filled 2020-08-23 (×2): qty 0.2

## 2020-08-23 MED ORDER — ENOXAPARIN SODIUM 30 MG/0.3ML ~~LOC~~ SOLN
30.0000 mg | SUBCUTANEOUS | Status: DC
  Administered 2020-08-24: 30 mg via SUBCUTANEOUS
  Filled 2020-08-23: qty 0.3

## 2020-08-23 NOTE — Progress Notes (Signed)
PROGRESS NOTE    Susan [name]  WLN:[id number] DOB: November 21, 1950 DOA: 08/21/2020 PCP: [name], MD  Brief Narrative:70/F with history of chronic diastolic CHF, CAD with PCI and stents, CKD stage IV, hypertension, pulmonary hypertension, peripheral neuropathy, recently hospitalized from 3/22-3/26 secondary to diastolic CHF and AKI , improved with diuretics creatinine improved from 4.1 on admission down to 1.78 at discharge, she felt well for the first few days, on 3/29 noticed that her urine output was low, also had labs done by her PCPs office which noted worsening creatinine and was advised to come to the ED. -In the emergency room she was noted to have acute kidney injury with creatinine trending up to 3.1 from 1.73 days ago, urinalysis was abnormal, she started on IV fluids and IV ceftriaxone and admitted                                                                                           Assessment & Plan:   AKI on CKD4 Hyperkalemia -Suspect she had ATN last admission and had resolving ATN at the time of discharge, creatinine trended up to 3.1 from 1.8 few days after discharge -Baseline creatinine around 1.5-2 range -CT without hydronephrosis -She was given IV fluids briefly on admission, this was discontinued yesterday -Clinically appears euvolemic, hold diuretics another day -Hyperkalemia has corrected, received Lokelma, calcium gluconate and insulin -Monitor I/os, BMP in a.m.  Chronic diastolic CHF -Last echo with preserved EF 74-08%, grade 1 diastolic dysfunction -Diuresed well last week and discharged home on torsemide which is currently on hold  Abnormal UA -many bacteria -Continue IV ceftriaxone day 2, urine culture with gram-negative rods  History of CAD -Prior history of PCI, BMS in 2007 and low risk nuclear study in 2018 -Continue aspirin, Plavix, carvedilol  Uncontrolled type 1 diabetes mellitus -Discontinued insulin pump -CBGs poorly controlled, increase  Lantus and add NovoLog with meals -Diabetes coordinator consult  History of transmetatarsal amputation  Prolonged QTC -Improved on repeat EKG  DVT prophylaxis:lovenox Code Status: Full Code Family Communication:none at bedside Disposition Plan:  Status is: Inpatient  Remains inpatient appropriate because:Inpatient level of care appropriate due to severity of illness   Dispo: The patient is from: Home              Anticipated d/c is to: Home              Patient currently is not medically stable to d/c.   Difficult to place patient No  Consultants   Procedures:   Antimicrobials:    Subjective: -Feels a little better today, denies any dyspnea at rest, has chronic dyspnea on exertion which reportedly is not different from baseline  Objective: Vitals:   08/22/20 1950 08/23/20 0103 08/23/20 0514 08/23/20 0955  BP: (!) 149/63 (!) 149/62 139/62 (!) 143/69  Pulse: 81 79 83 81  Resp: 16 18 18 18   Temp: 98.3 F (36.8 C)  98 F (36.7 C) 97.8 F (36.6 C)  TempSrc: Oral  Oral Oral  SpO2: 97% 95% 93% 97%  Weight: 116 kg       Intake/Output Summary (Last 24 hours) at 08/23/2020  [place] filed at 08/23/2020 1300 Gross per 24 hour  Intake 734 ml  Output 1100 ml  Net -366 ml   Filed Weights   08/22/20 1950  Weight: 116 kg    Examination:  General exam: Pleasant chronically ill obese female sitting up in bed, AAOx3, no distress HEENT: Neck obese unable to assess JVD CVS: S1-S2, regular rate rhythm Lungs: Decreased breath sounds to bases otherwise clear Abdomen: Soft, obese, bowel sounds present  Extremities: Trace edema, bilateral venous stasis changes, right TMT Psychiatry: Mood & affect appropriate.   Data Reviewed:   CBC: Recent Labs  Lab 08/21/20 1948 08/22/20 0602 08/23/20 0431  WBC 11.1* 9.2 7.1  NEUTROABS  --  6.8  --   HGB 11.7* 10.9* 10.3*  HCT 37.6 36.3 33.1*  MCV 86.2 88.1 87.8  PLT 224 174 017   Basic Metabolic Panel: Recent Labs   Lab 08/18/20 0242 08/19/20 0243 08/20/20 1527 08/21/20 1948 08/22/20 0602 08/22/20 1615 08/23/20 0431  NA 137 136 135 134* 132* 135 137  K 3.9 4.3 5.6* 5.2* 5.6* 4.5 4.7  CL 101 101 96* 98 98 99 103  CO2 28 27 23 26 22 26 26   GLUCOSE 229* 262* 362* 102* 324* 289* 274*  BUN 59* 57* 67* 76* 80* 75* 73*  CREATININE 1.78* 1.84* 2.57* 3.17* 2.73* 2.62* 2.50*  CALCIUM 9.0 8.8* 9.2 9.0 8.5* 8.7* 8.8*  MG 1.8 1.9  --   --  2.2  --   --   PHOS  --   --   --   --  4.8*  --   --    GFR: Estimated Creatinine Clearance: 26.7 mL/min (A) (by C-G formula based on SCr of 2.5 mg/dL (H)). Liver Function Tests: Recent Labs  Lab 08/21/20 1948 08/22/20 0602  AST 23 39  ALT 16 9  ALKPHOS 207* 189*  BILITOT 0.5 1.2  PROT 6.3* 5.7*  ALBUMIN 3.2* 2.8*   Recent Labs  Lab 08/21/20 1948  LIPASE 34   No results for input(s): AMMONIA in the last 168 hours. Coagulation Profile: No results for input(s): INR, PROTIME in the last 168 hours. Cardiac Enzymes: No results for input(s): CKTOTAL, CKMB, CKMBINDEX, TROPONINI in the last 168 hours. BNP (last 3 results) No results for input(s): PROBNP in the last 8760 hours. HbA1C: No results for input(s): HGBA1C in the last 72 hours. CBG: Recent Labs  Lab 08/22/20 1203 08/22/20 1554 08/22/20 2114 08/23/20 0643 08/23/20 1208  GLUCAP 305* 293* 308* 321* 309*   Lipid Profile: No results for input(s): CHOL, HDL, LDLCALC, TRIG, CHOLHDL, LDLDIRECT in the last 72 hours. Thyroid Function Tests: No results for input(s): TSH, T4TOTAL, FREET4, T3FREE, THYROIDAB in the last 72 hours. Anemia Panel: No results for input(s): VITAMINB12, FOLATE, FERRITIN, TIBC, IRON, RETICCTPCT in the last 72 hours. Urine analysis:    Component Value Date/Time   COLORURINE YELLOW 08/22/2020 0100   APPEARANCEUR HAZY (A) 08/22/2020 0100   LABSPEC 1.011 08/22/2020 0100   PHURINE 5.0 08/22/2020 0100   GLUCOSEU NEGATIVE 08/22/2020 0100   HGBUR NEGATIVE 08/22/2020 0100    BILIRUBINUR NEGATIVE 08/22/2020 0100   BILIRUBINUR negative 06/05/2020 1334   KETONESUR NEGATIVE 08/22/2020 0100   PROTEINUR 100 (A) 08/22/2020 0100   UROBILINOGEN 0.2 06/05/2020 1334   UROBILINOGEN 0.2 04/01/2014 1659   NITRITE NEGATIVE 08/22/2020 0100   LEUKOCYTESUR LARGE (A) 08/22/2020 0100   Sepsis Labs: @LABRCNTIP (procalcitonin:4,lacticidven:4)  ) Recent Results (from the past 240 hour(s))  SARS CORONAVIRUS 2 (TAT  6-24 HRS) Nasopharyngeal Nasopharyngeal Swab     Status: None   Collection Time: 08/14/20 12:20 AM   Specimen: Nasopharyngeal Swab  Result Value Ref Range Status   SARS Coronavirus 2 NEGATIVE NEGATIVE Final    Comment: (NOTE) SARS-CoV-2 target nucleic acids are NOT DETECTED.  The SARS-CoV-2 RNA is generally detectable in upper and lower respiratory specimens during the acute phase of infection. Negative results do not preclude SARS-CoV-2 infection, do not rule out co-infections with other pathogens, and should not be used as the sole basis for treatment or other patient management decisions. Negative results must be combined with clinical observations, patient history, and epidemiological information. The expected result is Negative.  Fact Sheet for Patients: SugarRoll.be  Fact Sheet for Healthcare Providers: https://www.woods-mathews.com/  This test is not yet approved or cleared by the [place] FDA and  has been authorized for detection and/or diagnosis of SARS-CoV-2 by FDA under an Emergency Use Authorization (EUA). This EUA will remain  in effect (meaning this test can be used) for the duration of the COVID-19 declaration under Se ction 564(b)(1) of the Act, 21 U.S.C. section 360bbb-3(b)(1), unless the authorization is terminated or revoked sooner.  Performed at [facility], [place] [address], [place], [place] [address]   Culture, Urine     Status: Abnormal   Collection Time: 08/14/20  5:30 AM    Specimen: Urine, Random  Result Value Ref Range Status   Specimen Description URINE, RANDOM  Final   Special Requests   Final    NONE Performed at [facility], [place] [address] [place], [place] [address]    Culture >=100,000 COLONIES/mL ESCHERICHIA COLI (A)  Final   Report Status 08/16/2020 FINAL  Final   Organism ID, Bacteria ESCHERICHIA COLI (A)  Final      Susceptibility   Escherichia coli - MIC*    AMPICILLIN 8 SENSITIVE Sensitive     CEFAZOLIN <=4 SENSITIVE Sensitive     CEFEPIME <=0.12 SENSITIVE Sensitive     CEFTRIAXONE <=0.25 SENSITIVE Sensitive     CIPROFLOXACIN <=0.25 SENSITIVE Sensitive     GENTAMICIN <=1 SENSITIVE Sensitive     IMIPENEM <=0.25 SENSITIVE Sensitive     NITROFURANTOIN <=16 SENSITIVE Sensitive     TRIMETH/SULFA <=20 SENSITIVE Sensitive     AMPICILLIN/SULBACTAM <=2 SENSITIVE Sensitive     PIP/TAZO <=4 SENSITIVE Sensitive     * >=100,000 COLONIES/mL ESCHERICHIA COLI  Resp Panel by RT-PCR (Flu A&B, Covid) Nasopharyngeal Swab     Status: None   Collection Time: 08/21/20 11:06 PM   Specimen: Nasopharyngeal Swab; Nasopharyngeal(NP) swabs in vial transport medium  Result Value Ref Range Status   SARS Coronavirus 2 by RT PCR NEGATIVE NEGATIVE Final    Comment: (NOTE) SARS-CoV-2 target nucleic acids are NOT DETECTED.  The SARS-CoV-2 RNA is generally detectable in upper respiratory specimens during the acute phase of infection. The lowest concentration of SARS-CoV-2 viral copies this assay can detect is 138 copies/mL. A negative result does not preclude SARS-Cov-2 infection and should not be used as the sole basis for treatment or other patient management decisions. A negative result may occur with  improper specimen collection/handling, submission of specimen other than nasopharyngeal swab, presence of viral mutation(s) within the areas targeted by this assay, and inadequate number of viral copies(<138 copies/mL). A negative result must be combined  with clinical observations, patient history, and epidemiological information. The expected result is Negative.  Fact Sheet for Patients:  EntrepreneurPulse.com.au  Fact Sheet for Healthcare  Providers:  IncredibleEmployment.be  This test is no t yet approved or cleared by the [place] and  has been authorized for detection and/or diagnosis of SARS-CoV-2 by FDA under an Emergency Use Authorization (EUA). This EUA will remain  in effect (meaning this test can be used) for the duration of the COVID-19 declaration under Section 564(b)(1) of the Act, 21 U.S.C.section 360bbb-3(b)(1), unless the authorization is terminated  or revoked sooner.       Influenza A by PCR NEGATIVE NEGATIVE Final   Influenza B by PCR NEGATIVE NEGATIVE Final    Comment: (NOTE) The Xpert Xpress SARS-CoV-2/FLU/RSV plus assay is intended as an aid in the diagnosis of influenza from Nasopharyngeal swab specimens and should not be used as a sole basis for treatment. Nasal washings and aspirates are unacceptable for Xpert Xpress SARS-CoV-2/FLU/RSV testing.  Fact Sheet for Patients: EntrepreneurPulse.com.au  Fact Sheet for Healthcare Providers: IncredibleEmployment.be  This test is not yet approved or cleared by the [place] FDA and has been authorized for detection and/or diagnosis of SARS-CoV-2 by FDA under an Emergency Use Authorization (EUA). This EUA will remain in effect (meaning this test can be used) for the duration of the COVID-19 declaration under Section 564(b)(1) of the Act, 21 U.S.C. section 360bbb-3(b)(1), unless the authorization is terminated or revoked.  Performed at [facility], [place] [address], [place], [place] [address]   Urine culture     Status: Abnormal (Preliminary result)   Collection Time: 08/22/20 12:45 AM   Specimen: Urine, Random  Result Value Ref Range Status   Specimen Description  URINE, RANDOM  Final   Special Requests NONE  Final   Culture (A)  Final    >=100,000 COLONIES/mL GRAM NEGATIVE RODS SUSCEPTIBILITIES TO FOLLOW Performed at [facility], [place] [address] [place], [place] [address]    Report Status PENDING  Incomplete     Radiology Studies: CT ABDOMEN PELVIS WO CONTRAST  Result Date: 08/22/2020 CLINICAL DATA:  Flank pain, urinary tract infection EXAM: CT ABDOMEN AND PELVIS WITHOUT CONTRAST TECHNIQUE: Multidetector CT imaging of the abdomen and pelvis was performed following the standard protocol without IV contrast. COMPARISON:  None. FINDINGS: Lower chest: Trace interstitial pulmonary edema within the visualized lung bases. No pleural effusion. Right coronary artery stenting has been performed. Cardiac size is mildly enlarged. Hypoattenuation of the cardiac blood pool is in keeping with at least mild anemia. Hepatobiliary: Cholelithiasis noted without pericholecystic inflammatory change identified. No intra or extrahepatic biliary ductal dilation. Liver unremarkable. Pancreas: Unremarkable Spleen: Scattered benign calcified granuloma are seen within the spleen in keeping with old granulomatous disease. The spleen is otherwise unremarkable. Adrenals/Urinary Tract: Adrenal glands are unremarkable. The kidneys are normal in size and position. Linear calcifications within the renal hila likely represent vascular calcifications. No definite calcifications within the renal collecting system. No hydronephrosis. No perinephric fluid collections. The bladder is unremarkable. Stomach/Bowel: Moderate sigmoid diverticulosis. Scattered diverticular noted within the remainder of the colon. Moderate stool throughout the colon without evidence of obstruction. Endoscopic clip noted within the a mid body of the stomach. The stomach, small bowel, and large bowel are otherwise unremarkable. Appendix normal. No free intraperitoneal gas or fluid. Vascular/Lymphatic: Moderate aortoiliac  atherosclerotic calcification. No aortic aneurysm. No pathologic adenopathy within the abdomen and pelvis. Reproductive: Status post hysterectomy. No adnexal masses. Other: No abdominal wall hernia.  Rectum unremarkable. Musculoskeletal: Osseous structures are age-appropriate. No acute bone abnormality. No lytic or blastic bone lesions are seen. IMPRESSION: No acute  intra-abdominal pathology identified. No definite radiographic explanation for the patient's reported symptoms. Unremarkable noncontrast examination of the kidneys and renal collecting system. No nephro or urolithiasis. Cholelithiasis. Moderate sigmoid diverticulosis. Moderate stool throughout the colon without evidence of obstruction. Aortic Atherosclerosis (ICD10-I70.0). Electronically Signed   By: Fidela Salisbury MD   On: 08/22/2020 05:25   DG Chest Portable 1 View  Result Date: 08/21/2020 CLINICAL DATA:  Congestive heart failure EXAM: PORTABLE CHEST 1 VIEW COMPARISON:  08/13/2020 FINDINGS: The heart size and mediastinal contours are within normal limits. Both lungs are clear. The visualized skeletal structures are unremarkable. IMPRESSION: No active disease. Electronically Signed   By: Fidela Salisbury MD   On: 08/21/2020 23:01        Scheduled Meds: . aspirin EC  81 mg Oral Q breakfast  . carvedilol  12.5 mg Oral BID WC  . clopidogrel  75 mg Oral Q breakfast  . enoxaparin (LOVENOX) injection  40 mg Subcutaneous Q24H  . ferrous sulfate  325 mg Oral BID WC  . insulin aspart  0-15 Units Subcutaneous TID AC & HS  . insulin aspart  3 Units Subcutaneous TID AC  . insulin glargine  20 Units Subcutaneous Daily  . levothyroxine  50 mcg Oral Q0600  . magnesium oxide  400 mg Oral Daily  . pregabalin  75 mg Oral Daily  . sodium zirconium cyclosilicate  10 g Oral Daily  . ursodiol  300 mg Oral TID   Continuous Infusions: . cefTRIAXone (ROCEPHIN)  IV 1 g (08/22/20 2147)     LOS: 1 day    Time spent: 48min  Domenic Polite, MD [facility] 08/23/2020, 1:36 PM

## 2020-08-24 ENCOUNTER — Inpatient Hospital Stay (HOSPITAL_COMMUNITY): Payer: HMO

## 2020-08-24 DIAGNOSIS — E785 Hyperlipidemia, unspecified: Secondary | ICD-10-CM | POA: Diagnosis not present

## 2020-08-24 DIAGNOSIS — E1165 Type 2 diabetes mellitus with hyperglycemia: Secondary | ICD-10-CM | POA: Diagnosis not present

## 2020-08-24 DIAGNOSIS — M1990 Unspecified osteoarthritis, unspecified site: Secondary | ICD-10-CM | POA: Diagnosis not present

## 2020-08-24 DIAGNOSIS — I2510 Atherosclerotic heart disease of native coronary artery without angina pectoris: Secondary | ICD-10-CM | POA: Diagnosis not present

## 2020-08-24 DIAGNOSIS — I5033 Acute on chronic diastolic (congestive) heart failure: Secondary | ICD-10-CM | POA: Diagnosis not present

## 2020-08-24 DIAGNOSIS — D631 Anemia in chronic kidney disease: Secondary | ICD-10-CM | POA: Diagnosis not present

## 2020-08-24 DIAGNOSIS — E039 Hypothyroidism, unspecified: Secondary | ICD-10-CM | POA: Diagnosis not present

## 2020-08-24 DIAGNOSIS — N184 Chronic kidney disease, stage 4 (severe): Secondary | ICD-10-CM | POA: Diagnosis not present

## 2020-08-24 DIAGNOSIS — E1122 Type 2 diabetes mellitus with diabetic chronic kidney disease: Secondary | ICD-10-CM | POA: Diagnosis not present

## 2020-08-24 DIAGNOSIS — I130 Hypertensive heart and chronic kidney disease with heart failure and stage 1 through stage 4 chronic kidney disease, or unspecified chronic kidney disease: Secondary | ICD-10-CM | POA: Diagnosis not present

## 2020-08-24 DIAGNOSIS — E1151 Type 2 diabetes mellitus with diabetic peripheral angiopathy without gangrene: Secondary | ICD-10-CM | POA: Diagnosis not present

## 2020-08-24 DIAGNOSIS — E1142 Type 2 diabetes mellitus with diabetic polyneuropathy: Secondary | ICD-10-CM | POA: Diagnosis not present

## 2020-08-24 DIAGNOSIS — M109 Gout, unspecified: Secondary | ICD-10-CM | POA: Diagnosis not present

## 2020-08-24 LAB — GLUCOSE, CAPILLARY
Glucose-Capillary: 311 mg/dL — ABNORMAL HIGH (ref 70–99)
Glucose-Capillary: 368 mg/dL — ABNORMAL HIGH (ref 70–99)
Glucose-Capillary: 266 mg/dL — ABNORMAL HIGH (ref 70–99)
Glucose-Capillary: 152 mg/dL — ABNORMAL HIGH (ref 70–99)

## 2020-08-24 LAB — BASIC METABOLIC PANEL
Anion gap: 8 (ref 5–15)
BUN: 69 mg/dL — ABNORMAL HIGH (ref 8–23)
CO2: 26 mmol/L (ref 22–32)
Calcium: 8.7 mg/dL — ABNORMAL LOW (ref 8.9–10.3)
Chloride: 102 mmol/L (ref 98–111)
Creatinine, Ser: 1.95 mg/dL — ABNORMAL HIGH (ref 0.44–1.00)
GFR, Estimated: 27 mL/min — ABNORMAL LOW (ref >60)
Glucose, Bld: 305 mg/dL — ABNORMAL HIGH (ref 70–99)
Potassium: 4.1 mmol/L (ref 3.5–5.1)
Sodium: 136 mmol/L (ref 135–145)

## 2020-08-24 LAB — URINE CULTURE: Culture: >=100000 — AB

## 2020-08-24 LAB — CBC
HCT: 33.6 % — ABNORMAL LOW (ref 36.0–46.0)
Hemoglobin: 10.4 g/dL — ABNORMAL LOW (ref 12.0–15.0)
MCH: 26.3 pg (ref 26.0–34.0)
MCHC: 31 g/dL (ref 30.0–36.0)
MCV: 85.1 fL (ref 80.0–100.0)
Platelets: 172 10*3/uL (ref 150–400)
RBC: 3.95 MIL/uL (ref 3.87–5.11)
RDW: 17.3 % — ABNORMAL HIGH (ref 11.5–15.5)
WBC: 5.9 10*3/uL (ref 4.0–10.5)
nRBC: 0 % (ref 0.0–0.2)

## 2020-08-24 MED ORDER — INSULIN ASPART 100 UNIT/ML IV SOLN
5.0000 [IU] | Freq: Three times a day (TID) | INTRAVENOUS | Status: DC
  Administered 2020-08-24 – 2020-08-25 (×4): 5 [IU] via SUBCUTANEOUS

## 2020-08-24 MED ORDER — ENOXAPARIN SODIUM 40 MG/0.4ML ~~LOC~~ SOLN
40.0000 mg | SUBCUTANEOUS | Status: DC
  Administered 2020-08-25 – 2020-08-27 (×3): 40 mg via SUBCUTANEOUS
  Filled 2020-08-24 (×3): qty 0.4

## 2020-08-24 MED ORDER — TORSEMIDE 20 MG PO TABS
20.0000 mg | ORAL_TABLET | Freq: Every day | ORAL | Status: DC
  Administered 2020-08-24 – 2020-08-27 (×4): 20 mg via ORAL
  Filled 2020-08-24 (×4): qty 1

## 2020-08-24 MED ORDER — INSULIN GLARGINE 100 UNIT/ML ~~LOC~~ SOLN: 25.0000 [IU] | Freq: Every day | SUBCUTANEOUS | Status: DC

## 2020-08-24 MED ORDER — SODIUM CHLORIDE 0.9 % IV SOLN
1.0000 g | INTRAVENOUS | Status: DC
  Administered 2020-08-24 – 2020-08-26 (×3): 1 g via INTRAVENOUS
  Filled 2020-08-24 (×4): qty 10

## 2020-08-24 MED ORDER — IPRATROPIUM-ALBUTEROL 0.5-2.5 (3) MG/3ML IN SOLN
3.0000 mL | Freq: Four times a day (QID) | RESPIRATORY_TRACT | Status: DC
  Administered 2020-08-24 (×3): 3 mL via RESPIRATORY_TRACT
  Filled 2020-08-24 (×3): qty 3

## 2020-08-24 MED ORDER — INSULIN GLARGINE 100 UNIT/ML ~~LOC~~ SOLN
20.0000 [IU] | Freq: Two times a day (BID) | SUBCUTANEOUS | Status: DC
  Administered 2020-08-24: 20 [IU] via SUBCUTANEOUS
  Filled 2020-08-24 (×3): qty 0.2

## 2020-08-24 MED ORDER — INSULIN GLARGINE 100 UNIT/ML ~~LOC~~ SOLN
15.0000 [IU] | Freq: Two times a day (BID) | SUBCUTANEOUS | Status: DC
  Administered 2020-08-24: 15 [IU] via SUBCUTANEOUS
  Filled 2020-08-24 (×2): qty 0.15

## 2020-08-24 NOTE — Progress Notes (Signed)
PROGRESS NOTE    Susan [name]  NAT:[id number] DOB: [birth year] DOA: 08/21/2020 PCP: [name], MD  Brief Narrative:70/F with history of chronic diastolic CHF, CAD with PCI and stents, CKD stage IV, hypertension, pulmonary hypertension, peripheral neuropathy, recently hospitalized from 3/22-3/26 secondary to diastolic CHF and AKI , improved with diuretics creatinine improved from 4.1 on admission down to 1.78 at discharge, she felt well for the first few days, on 3/29 noticed that her urine output was low, also had labs done by her PCPs office which noted worsening creatinine and was advised to come to the ED. -In the emergency room she was noted to have acute kidney injury with creatinine trending up to 3.1 from 1.73 days ago, urinalysis was abnormal, she started on IV fluids and IV ceftriaxone and admitted                                                                                           Assessment & Plan:   AKI on CKD4 Hyperkalemia -Suspect she had ATN last admission and had resolving ATN at the time of discharge, creatinine trended up to 3.1 from 1.8 few days after discharge -Baseline creatinine around 1.5-2 range -CT without hydronephrosis -She was given IV fluids briefly on admission for 12h -Clinically appears euvolemic -Hyperkalemia has corrected, received Lokelma, calcium gluconate and insulin -Creatinine improving, down to 1.9 today,  -Restart torsemide at a dose  Chronic diastolic CHF -Last echo with preserved EF 42-70%, grade 1 diastolic dysfunction -Diuresed well last week and discharged home on torsemide which is currently on hold  E. coli UTI -Continue IV ceftriaxone day 3, culture with pansensitive E. Coli -In addition patient also has bronchitis symptoms, continue ceftriaxone for 2 more days  Bronchitis  -cough congestion for 1 to 2 days -Suspected bronchitis, check chest x-ray -Add duo nebs, continue ceftriaxone  History of CAD -Prior history of PCI,  BMS in 2007 and low risk nuclear study in 2018 -Continue aspirin, Plavix, carvedilol  Uncontrolled type 1 diabetes mellitus -Discontinued insulin pump -CBGs poorly controlled, increase Lantus to 20 units twice daily, increased dose of NovoLog with meals -Diabetes coordinator consult  History of transmetatarsal amputation  Prolonged QTC -Improved on repeat EKG  DVT prophylaxis:lovenox Code Status: Full Code Family Communication:none at bedside Disposition Plan:  Status is: Inpatient  Remains inpatient appropriate because:Inpatient level of care appropriate due to severity of illness   Dispo: The patient is from: Home              Anticipated d/c is to: Home              Patient currently is not medically stable to d/c.   Difficult to place patient No  Consultants   Procedures:   Antimicrobials:    Subjective: -Feels a little better today, denies any dyspnea at rest, has chronic dyspnea on exertion which reportedly is not different from baseline  Objective: Vitals:   08/24/20 0342 08/24/20 0511 08/24/20 0931 08/24/20 1119  BP:  (!) 159/72 (!) 174/73   Pulse:  78 87   Resp:  18 16   Temp:  97.9 F (36.6  C) 98 F (36.7 C)   TempSrc:  Oral Oral   SpO2:  97% 95% 99%  Weight:      Height: 5\' 6"  (1.676 m)       Intake/Output Summary (Last 24 hours) at 08/24/2020 1503 Last data filed at 08/24/2020 1210 Gross per 24 hour  Intake 1011 ml  Output 2300 ml  Net -1289 ml   Filed Weights   08/22/20 1950  Weight: 116 kg    Examination:  General exam: Pleasant chronically ill obese female sitting up in bed, AAOx3, no distress HEENT: Neck obese unable to assess JVD CVS: S1-S2, regular rate rhythm Lungs: Decreased breath sounds to bases otherwise clear Abdomen: Soft, obese, bowel sounds present  Extremities: Trace edema, bilateral venous stasis changes, right TMT Psychiatry: Mood & affect appropriate.   Data Reviewed:   CBC: Recent Labs  Lab 08/21/20 1948  08/22/20 0602 08/23/20 0431 08/24/20 0438  WBC 11.1* 9.2 7.1 5.9  NEUTROABS  --  6.8  --   --   HGB 11.7* 10.9* 10.3* 10.4*  HCT 37.6 36.3 33.1* 33.6*  MCV 86.2 88.1 87.8 85.1  PLT 224 174 161 559   Basic Metabolic Panel: Recent Labs  Lab 08/18/20 0242 08/19/20 0243 08/20/20 1527 08/21/20 1948 08/22/20 0602 08/22/20 1615 08/23/20 0431 08/24/20 0438  NA 137 136   < > 134* 132* 135 137 136  K 3.9 4.3   < > 5.2* 5.6* 4.5 4.7 4.1  CL 101 101   < > 98 98 99 103 102  CO2 28 27   < > 26 22 26 26 26   GLUCOSE 229* 262*   < > 102* 324* 289* 274* 305*  BUN 59* 57*   < > 76* 80* 75* 73* 69*  CREATININE 1.78* 1.84*   < > 3.17* 2.73* 2.62* 2.50* 1.95*  CALCIUM 9.0 8.8*   < > 9.0 8.5* 8.7* 8.8* 8.7*  MG 1.8 1.9  --   --  2.2  --   --   --   PHOS  --   --   --   --  4.8*  --   --   --    < > = values in this interval not displayed.   GFR: Estimated Creatinine Clearance: 34.3 mL/min (A) (by C-G formula based on SCr of 1.95 mg/dL (H)). Liver Function Tests: Recent Labs  Lab 08/21/20 1948 08/22/20 0602  AST 23 39  ALT 16 9  ALKPHOS 207* 189*  BILITOT 0.5 1.2  PROT 6.3* 5.7*  ALBUMIN 3.2* 2.8*   Recent Labs  Lab 08/21/20 1948  LIPASE 34   No results for input(s): AMMONIA in the last 168 hours. Coagulation Profile: No results for input(s): INR, PROTIME in the last 168 hours. Cardiac Enzymes: No results for input(s): CKTOTAL, CKMB, CKMBINDEX, TROPONINI in the last 168 hours. BNP (last 3 results) No results for input(s): PROBNP in the last 8760 hours. HbA1C: No results for input(s): HGBA1C in the last 72 hours. CBG: Recent Labs  Lab 08/23/20 1208 08/23/20 1645 08/23/20 2155 08/24/20 0634 08/24/20 1110  GLUCAP 309* 373* 224* 311* 368*   Lipid Profile: No results for input(s): CHOL, HDL, LDLCALC, TRIG, CHOLHDL, LDLDIRECT in the last 72 hours. Thyroid Function Tests: No results for input(s): TSH, T4TOTAL, FREET4, T3FREE, THYROIDAB in the last 72 hours. Anemia  Panel: No results for input(s): VITAMINB12, FOLATE, FERRITIN, TIBC, IRON, RETICCTPCT in the last 72 hours. Urine analysis:    Component Value Date/Time  COLORURINE YELLOW 08/22/2020 0100   APPEARANCEUR HAZY (A) 08/22/2020 0100   LABSPEC 1.011 08/22/2020 0100   PHURINE 5.0 08/22/2020 0100   GLUCOSEU NEGATIVE 08/22/2020 0100   HGBUR NEGATIVE 08/22/2020 0100   BILIRUBINUR NEGATIVE 08/22/2020 0100   BILIRUBINUR negative 06/05/2020 1334   KETONESUR NEGATIVE 08/22/2020 0100   PROTEINUR 100 (A) 08/22/2020 0100   UROBILINOGEN 0.2 06/05/2020 1334   UROBILINOGEN 0.2 04/01/2014 1659   NITRITE NEGATIVE 08/22/2020 0100   LEUKOCYTESUR LARGE (A) 08/22/2020 0100   Sepsis Labs: @LABRCNTIP (procalcitonin:4,lacticidven:4)  ) Recent Results (from the past 240 hour(s))  Resp Panel by RT-PCR (Flu A&B, Covid) Nasopharyngeal Swab     Status: None   Collection Time: 08/21/20 11:06 PM   Specimen: Nasopharyngeal Swab; Nasopharyngeal(NP) swabs in vial transport medium  Result Value Ref Range Status   SARS Coronavirus 2 by RT PCR NEGATIVE NEGATIVE Final    Comment: (NOTE) SARS-CoV-2 target nucleic acids are NOT DETECTED.  The SARS-CoV-2 RNA is generally detectable in upper respiratory specimens during the acute phase of infection. The lowest concentration of SARS-CoV-2 viral copies this assay can detect is 138 copies/mL. A negative result does not preclude SARS-Cov-2 infection and should not be used as the sole basis for treatment or other patient management decisions. A negative result may occur with  improper specimen collection/handling, submission of specimen other than nasopharyngeal swab, presence of viral mutation(s) within the areas targeted by this assay, and inadequate number of viral copies(<138 copies/mL). A negative result must be combined with clinical observations, patient history, and epidemiological information. The expected result is Negative.  Fact Sheet for Patients:   EntrepreneurPulse.com.au  Fact Sheet for Healthcare Providers:  IncredibleEmployment.be  This test is no t yet approved or cleared by the [place] FDA and  has been authorized for detection and/or diagnosis of SARS-CoV-2 by FDA under an Emergency Use Authorization (EUA). This EUA will remain  in effect (meaning this test can be used) for the duration of the COVID-19 declaration under Section 564(b)(1) of the Act, 21 U.S.C.section 360bbb-3(b)(1), unless the authorization is terminated  or revoked sooner.       Influenza A by PCR NEGATIVE NEGATIVE Final   Influenza B by PCR NEGATIVE NEGATIVE Final    Comment: (NOTE) The Xpert Xpress SARS-CoV-2/FLU/RSV plus assay is intended as an aid in the diagnosis of influenza from Nasopharyngeal swab specimens and should not be used as a sole basis for treatment. Nasal washings and aspirates are unacceptable for Xpert Xpress SARS-CoV-2/FLU/RSV testing.  Fact Sheet for Patients: EntrepreneurPulse.com.au  Fact Sheet for Healthcare Providers: IncredibleEmployment.be  This test is not yet approved or cleared by the [place] FDA and has been authorized for detection and/or diagnosis of SARS-CoV-2 by FDA under an Emergency Use Authorization (EUA). This EUA will remain in effect (meaning this test can be used) for the duration of the COVID-19 declaration under Section 564(b)(1) of the Act, 21 U.S.C. section 360bbb-3(b)(1), unless the authorization is terminated or revoked.  Performed at [facility], [place] [address] [place], [place] [address]   Urine culture     Status: Abnormal   Collection Time: 08/22/20 12:45 AM   Specimen: Urine, Random  Result Value Ref Range Status   Specimen Description URINE, RANDOM  Final   Special Requests   Final    NONE Performed at [facility], [place] [address], [place], [place] [address]    Culture >=100,000  COLONIES/mL ESCHERICHIA COLI (A)  Final   Report Status 08/24/2020  FINAL  Final   Organism ID, Bacteria ESCHERICHIA COLI (A)  Final      Susceptibility   Escherichia coli - MIC*    AMPICILLIN 8 SENSITIVE Sensitive     CEFAZOLIN <=4 SENSITIVE Sensitive     CEFEPIME <=0.12 SENSITIVE Sensitive     CEFTRIAXONE <=0.25 SENSITIVE Sensitive     CIPROFLOXACIN <=0.25 SENSITIVE Sensitive     GENTAMICIN <=1 SENSITIVE Sensitive     IMIPENEM <=0.25 SENSITIVE Sensitive     NITROFURANTOIN <=16 SENSITIVE Sensitive     TRIMETH/SULFA <=20 SENSITIVE Sensitive     AMPICILLIN/SULBACTAM <=2 SENSITIVE Sensitive     PIP/TAZO <=4 SENSITIVE Sensitive     * >=100,000 COLONIES/mL ESCHERICHIA COLI     Radiology Studies: DG CHEST PORT 1 VIEW  Result Date: 08/24/2020 CLINICAL DATA:  Shortness of breath EXAM: PORTABLE CHEST 1 VIEW COMPARISON:  August 21, 2020 and May 24, 2020 FINDINGS: There is overall interstitial thickening without frank edema or consolidation. Heart is upper normal in size with pulmonary vascularity normal. No adenopathy. There is aortic atherosclerosis. No bone lesions. IMPRESSION: Interstitial thickening, likely representing a degree of chronic bronchitis. No edema or airspace opacity. Stable cardiac silhouette. Aortic Atherosclerosis (ICD10-I70.0). Electronically Signed   By: Lowella Grip III M.D.   On: 08/24/2020 10:49   Scheduled Meds: . aspirin EC  81 mg Oral Q breakfast  . carvedilol  12.5 mg Oral BID WC  . cephALEXin  500 mg Oral Q12H  . clopidogrel  75 mg Oral Q breakfast  . [START ON 08/25/2020] enoxaparin (LOVENOX) injection  40 mg Subcutaneous Q24H  . ferrous sulfate  325 mg Oral BID WC  . insulin aspart  0-15 Units Subcutaneous TID AC & HS  . insulin aspart  5 Units Subcutaneous TID AC  . insulin glargine  15 Units Subcutaneous BID  . ipratropium-albuterol  3 mL Nebulization QID  . levothyroxine  50 mcg Oral Q0600  . magnesium oxide  400 mg Oral Daily  . pregabalin  75 mg  Oral Daily  . torsemide  20 mg Oral Daily  . ursodiol  300 mg Oral TID   Continuous Infusions:    LOS: 2 days   Time spent: 8min  Domenic Polite, MD [facility] 08/24/2020, 3:03 PM

## 2020-08-25 DIAGNOSIS — N184 Chronic kidney disease, stage 4 (severe): Secondary | ICD-10-CM

## 2020-08-25 DIAGNOSIS — N179 Acute kidney failure, unspecified: Principal | ICD-10-CM

## 2020-08-25 LAB — CBC
HCT: 37.6 % (ref 36.0–46.0)
Hemoglobin: 11.3 g/dL — ABNORMAL LOW (ref 12.0–15.0)
MCH: 27 pg (ref 26.0–34.0)
MCHC: 30.1 g/dL (ref 30.0–36.0)
MCV: 89.7 fL (ref 80.0–100.0)
Platelets: 192 10*3/uL (ref 150–400)
RBC: 4.19 MIL/uL (ref 3.87–5.11)
RDW: 17.3 % — ABNORMAL HIGH (ref 11.5–15.5)
WBC: 5.4 10*3/uL (ref 4.0–10.5)
nRBC: 0 % (ref 0.0–0.2)

## 2020-08-25 LAB — BASIC METABOLIC PANEL
Anion gap: 9 (ref 5–15)
BUN: 54 mg/dL — ABNORMAL HIGH (ref 8–23)
CO2: 21 mmol/L — ABNORMAL LOW (ref 22–32)
Calcium: 8.8 mg/dL — ABNORMAL LOW (ref 8.9–10.3)
Chloride: 105 mmol/L (ref 98–111)
Creatinine, Ser: 1.81 mg/dL — ABNORMAL HIGH (ref 0.44–1.00)
GFR, Estimated: 30 mL/min — ABNORMAL LOW (ref >60)
Glucose, Bld: 310 mg/dL — ABNORMAL HIGH (ref 70–99)
Potassium: 4.2 mmol/L (ref 3.5–5.1)
Sodium: 135 mmol/L (ref 135–145)

## 2020-08-25 LAB — GLUCOSE, CAPILLARY
Glucose-Capillary: 345 mg/dL — ABNORMAL HIGH (ref 70–99)
Glucose-Capillary: 221 mg/dL — ABNORMAL HIGH (ref 70–99)
Glucose-Capillary: 238 mg/dL — ABNORMAL HIGH (ref 70–99)
Glucose-Capillary: 399 mg/dL — ABNORMAL HIGH (ref 70–99)

## 2020-08-25 MED ORDER — PANTOPRAZOLE SODIUM 40 MG PO TBEC
40.0000 mg | DELAYED_RELEASE_TABLET | Freq: Every day | ORAL | Status: DC
  Administered 2020-08-25 – 2020-08-27 (×3): 40 mg via ORAL
  Filled 2020-08-25 (×4): qty 1

## 2020-08-25 MED ORDER — INSULIN GLARGINE 100 UNIT/ML ~~LOC~~ SOLN
30.0000 [IU] | Freq: Two times a day (BID) | SUBCUTANEOUS | Status: DC
  Administered 2020-08-25 (×2): 30 [IU] via SUBCUTANEOUS
  Filled 2020-08-25 (×4): qty 0.3

## 2020-08-25 MED ORDER — INSULIN GLARGINE 100 UNIT/ML ~~LOC~~ SOLN
25.0000 [IU] | Freq: Two times a day (BID) | SUBCUTANEOUS | Status: DC
  Filled 2020-08-25 (×2): qty 0.25

## 2020-08-25 MED ORDER — ONDANSETRON HCL 4 MG/2ML IJ SOLN
4.0000 mg | Freq: Four times a day (QID) | INTRAMUSCULAR | Status: DC | PRN
  Administered 2020-08-25: 4 mg via INTRAVENOUS
  Filled 2020-08-25: qty 2

## 2020-08-25 MED ORDER — TRAZODONE HCL 100 MG PO TABS: 150.0000 mg | ORAL_TABLET | Freq: Every day | ORAL | Status: DC

## 2020-08-25 MED ORDER — TRAZODONE HCL 50 MG PO TABS
150.0000 mg | ORAL_TABLET | Freq: Every day | ORAL | Status: DC
  Administered 2020-08-25 – 2020-08-26 (×2): 150 mg via ORAL
  Filled 2020-08-25 (×2): qty 1

## 2020-08-25 MED ORDER — INSULIN ASPART 100 UNIT/ML IV SOLN
6.0000 [IU] | Freq: Three times a day (TID) | INTRAVENOUS | Status: DC
  Administered 2020-08-25 – 2020-08-26 (×5): 6 [IU] via SUBCUTANEOUS

## 2020-08-25 MED ORDER — IPRATROPIUM-ALBUTEROL 0.5-2.5 (3) MG/3ML IN SOLN: 3.0000 mL | RESPIRATORY_TRACT | Status: DC | PRN

## 2020-08-25 MED ORDER — METHYLPREDNISOLONE SODIUM SUCC 40 MG IJ SOLR
40.0000 mg | Freq: Two times a day (BID) | INTRAMUSCULAR | Status: DC
  Administered 2020-08-25 (×2): 40 mg via INTRAVENOUS
  Filled 2020-08-25 (×2): qty 1

## 2020-08-25 NOTE — Progress Notes (Signed)
PROGRESS NOTE    Lachrisha Ziebarth  IOX:[id number] DOB: [birth year] DOA: 08/21/2020 PCP: [name], MD  Brief Narrative:70/F with history of chronic diastolic CHF, CAD with PCI and stents, CKD stage IV, hypertension, pulmonary hypertension, peripheral neuropathy, recently hospitalized from 3/22-3/26 secondary to diastolic CHF and AKI , improved with diuretics creatinine improved from 4.1 on admission down to 1.78 at discharge, she felt well for the first few days, on 3/29 noticed that her urine output was low, also had labs done by her PCPs office which noted worsening creatinine and was advised to come to the ED. -In the emergency room she was noted to have acute kidney injury with creatinine trending up to 3.1 from 1.73 days ago, urinalysis was abnormal, she started on IV fluids and IV ceftriaxone and admitted                                                                                           Assessment & Plan:   AKI on CKD4 Hyperkalemia -Suspect she had ATN last admission and had resolving ATN at the time of discharge, creatinine trended up to 3.1 from 1.8 few days after discharge -Baseline creatinine around 1.5-2 range -CT without hydronephrosis -She was given IV fluids briefly on admission for 12h -Clinically appears euvolemic, she is -5.4 L this admission, creatinine down to 1.8 -Restarted torsemide 20 mg daily  Acute bronchitis  -cough congestion for 1 to 2 days, with some chest tightness and wheezing -Former smoker, chest x-ray does not indicate fluid overload -add Solu-Medrol, continue ceftriaxone and DuoNeb's today -Ambulate as tolerated  Chronic diastolic CHF -Last echo with preserved EF 26-83%, grade 1 diastolic dysfunction -Diuresed aggressively last week and discharged home on torsemide 80 mg twice daily, readmitted in few days with bump in creatinine -Clinically appears euvolemic, torsemide restarted at 20 mg daily, she is auto diuresing, -5.4 L this admission  E.  coli UTI -Continue IV ceftriaxone day 4, culture with pansensitive E. Coli -In addition patient also has bronchitis symptoms, continue ceftriaxone for 1-2 more days  History of CAD -Prior history of PCI, BMS in 2007 and low risk nuclear study in 2018 -Continue aspirin, Plavix, carvedilol  Uncontrolled type 1 diabetes mellitus -Discontinued insulin pump -CBGs poorly controlled, increase Lantus to 30 units twice daily and NovoLog with meals  -Diabetes coordinator consult appreciated  History of transmetatarsal amputation  Prolonged QTC -Improved on repeat EKG  DVT prophylaxis:lovenox Code Status: Full Code Family Communication:none at bedside Disposition Plan:  Status is: Inpatient  Remains inpatient appropriate because:Inpatient level of care appropriate due to severity of illness   Dispo: The patient is from: Home              Anticipated d/c is to: Home hopefully tomorrow              Patient currently is not medically stable to d/c.   Difficult to place patient No  Consultants   Procedures:   Antimicrobials:    Subjective: -Complains of cough, chest tightness, more dyspnea than usual, denies any orthopnea Objective: Vitals:   08/24/20 2103 08/25/20 0458 08/25/20 0827 08/25/20 1005  BP:  (!) 162/78 (!) 156/70 (!) 167/75  Pulse:  72 75 75  Resp:  18  18  Temp:  97.8 F (36.6 C)  98.3 F (36.8 C)  TempSrc:  Oral    SpO2: 95% 93%  94%  Weight:      Height:        Intake/Output Summary (Last 24 hours) at 08/25/2020 1353 Last data filed at 08/25/2020 1247 Gross per 24 hour  Intake 1259.87 ml  Output 2850 ml  Net -1590.13 ml   Filed Weights   08/22/20 1950  Weight: 116 kg    Examination:  General exam: Obese pleasant female sitting up in bed, AAOx3, no distress HEENT: Neck obese unable to assess JVD CVS: S1-S2, regular rate rhythm Lungs: Decreased breath sounds the bases, otherwise clear Abdomen: Soft, obese, nontender, bowel sounds  present Extremities: Trace edema, bilateral venous stasis changes, right trans-metatarsal amputation Psychiatry: Mood & affect appropriate.   Data Reviewed:   CBC: Recent Labs  Lab 08/21/20 1948 08/22/20 0602 08/23/20 0431 08/24/20 0438 08/25/20 0335  WBC 11.1* 9.2 7.1 5.9 5.4  NEUTROABS  --  6.8  --   --   --   HGB 11.7* 10.9* 10.3* 10.4* 11.3*  HCT 37.6 36.3 33.1* 33.6* 37.6  MCV 86.2 88.1 87.8 85.1 89.7  PLT 224 174 161 172 568   Basic Metabolic Panel: Recent Labs  Lab 08/19/20 0243 08/20/20 1527 08/22/20 0602 08/22/20 1615 08/23/20 0431 08/24/20 0438 08/25/20 0335  NA 136   < > 132* 135 137 136 135  K 4.3   < > 5.6* 4.5 4.7 4.1 4.2  CL 101   < > 98 99 103 102 105  CO2 27   < > 22 26 26 26  21*  GLUCOSE 262*   < > 324* 289* 274* 305* 310*  BUN 57*   < > 80* 75* 73* 69* 54*  CREATININE 1.84*   < > 2.73* 2.62* 2.50* 1.95* 1.81*  CALCIUM 8.8*   < > 8.5* 8.7* 8.8* 8.7* 8.8*  MG 1.9  --  2.2  --   --   --   --   PHOS  --   --  4.8*  --   --   --   --    < > = values in this interval not displayed.   GFR: Estimated Creatinine Clearance: 36.9 mL/min (A) (by C-G formula based on SCr of 1.81 mg/dL (H)). Liver Function Tests: Recent Labs  Lab 08/21/20 1948 08/22/20 0602  AST 23 39  ALT 16 9  ALKPHOS 207* 189*  BILITOT 0.5 1.2  PROT 6.3* 5.7*  ALBUMIN 3.2* 2.8*   Recent Labs  Lab 08/21/20 1948  LIPASE 34   No results for input(s): AMMONIA in the last 168 hours. Coagulation Profile: No results for input(s): INR, PROTIME in the last 168 hours. Cardiac Enzymes: No results for input(s): CKTOTAL, CKMB, CKMBINDEX, TROPONINI in the last 168 hours. BNP (last 3 results) No results for input(s): PROBNP in the last 8760 hours. HbA1C: No results for input(s): HGBA1C in the last 72 hours. CBG: Recent Labs  Lab 08/24/20 1110 08/24/20 1634 08/24/20 2056 08/25/20 0645 08/25/20 1122  GLUCAP 368* 266* 152* 345* 221*   Lipid Profile: No results for input(s):  CHOL, HDL, LDLCALC, TRIG, CHOLHDL, LDLDIRECT in the last 72 hours. Thyroid Function Tests: No results for input(s): TSH, T4TOTAL, FREET4, T3FREE, THYROIDAB in the last 72 hours. Anemia Panel: No results for input(s): VITAMINB12, FOLATE, FERRITIN,  TIBC, IRON, RETICCTPCT in the last 72 hours. Urine analysis:    Component Value Date/Time   COLORURINE YELLOW 08/22/2020 0100   APPEARANCEUR HAZY (A) 08/22/2020 0100   LABSPEC 1.011 08/22/2020 0100   PHURINE 5.0 08/22/2020 0100   GLUCOSEU NEGATIVE 08/22/2020 0100   HGBUR NEGATIVE 08/22/2020 0100   BILIRUBINUR NEGATIVE 08/22/2020 0100   BILIRUBINUR negative 06/05/2020 1334   KETONESUR NEGATIVE 08/22/2020 0100   PROTEINUR 100 (A) 08/22/2020 0100   UROBILINOGEN 0.2 06/05/2020 1334   UROBILINOGEN 0.2 04/01/2014 1659   NITRITE NEGATIVE 08/22/2020 0100   LEUKOCYTESUR LARGE (A) 08/22/2020 0100   Sepsis Labs: @LABRCNTIP (procalcitonin:4,lacticidven:4)  ) Recent Results (from the past 240 hour(s))  Resp Panel by RT-PCR (Flu A&B, Covid) Nasopharyngeal Swab     Status: None   Collection Time: 08/21/20 11:06 PM   Specimen: Nasopharyngeal Swab; Nasopharyngeal(NP) swabs in vial transport medium  Result Value Ref Range Status   SARS Coronavirus 2 by RT PCR NEGATIVE NEGATIVE Final    Comment: (NOTE) SARS-CoV-2 target nucleic acids are NOT DETECTED.  The SARS-CoV-2 RNA is generally detectable in upper respiratory specimens during the acute phase of infection. The lowest concentration of SARS-CoV-2 viral copies this assay can detect is 138 copies/mL. A negative result does not preclude SARS-Cov-2 infection and should not be used as the sole basis for treatment or other patient management decisions. A negative result may occur with  improper specimen collection/handling, submission of specimen other than nasopharyngeal swab, presence of viral mutation(s) within the areas targeted by this assay, and inadequate number of viral copies(<138 copies/mL).  A negative result must be combined with clinical observations, patient history, and epidemiological information. The expected result is Negative.  Fact Sheet for Patients:  EntrepreneurPulse.com.au  Fact Sheet for Healthcare Providers:  IncredibleEmployment.be  This test is no t yet approved or cleared by the [place] FDA and  has been authorized for detection and/or diagnosis of SARS-CoV-2 by FDA under an Emergency Use Authorization (EUA). This EUA will remain  in effect (meaning this test can be used) for the duration of the COVID-19 declaration under Section 564(b)(1) of the Act, 21 U.S.C.section 360bbb-3(b)(1), unless the authorization is terminated  or revoked sooner.       Influenza A by PCR NEGATIVE NEGATIVE Final   Influenza B by PCR NEGATIVE NEGATIVE Final    Comment: (NOTE) The Xpert Xpress SARS-CoV-2/FLU/RSV plus assay is intended as an aid in the diagnosis of influenza from Nasopharyngeal swab specimens and should not be used as a sole basis for treatment. Nasal washings and aspirates are unacceptable for Xpert Xpress SARS-CoV-2/FLU/RSV testing.  Fact Sheet for Patients: EntrepreneurPulse.com.au  Fact Sheet for Healthcare Providers: IncredibleEmployment.be  This test is not yet approved or cleared by the [place] FDA and has been authorized for detection and/or diagnosis of SARS-CoV-2 by FDA under an Emergency Use Authorization (EUA). This EUA will remain in effect (meaning this test can be used) for the duration of the COVID-19 declaration under Section 564(b)(1) of the Act, 21 U.S.C. section 360bbb-3(b)(1), unless the authorization is terminated or revoked.  Performed at [facility], [place] [address] [place], [place] [address]   Urine culture     Status: Abnormal   Collection Time: 08/22/20 12:45 AM   Specimen: Urine, Random  Result Value Ref Range Status   Specimen  Description URINE, RANDOM  Final   Special Requests   Final    NONE Performed at [facility], [place],  [place] [address]    Culture >=100,000 COLONIES/mL ESCHERICHIA COLI (A)  Final   Report Status 08/24/2020 FINAL  Final   Organism ID, Bacteria ESCHERICHIA COLI (A)  Final      Susceptibility   Escherichia coli - MIC*    AMPICILLIN 8 SENSITIVE Sensitive     CEFAZOLIN <=4 SENSITIVE Sensitive     CEFEPIME <=0.12 SENSITIVE Sensitive     CEFTRIAXONE <=0.25 SENSITIVE Sensitive     CIPROFLOXACIN <=0.25 SENSITIVE Sensitive     GENTAMICIN <=1 SENSITIVE Sensitive     IMIPENEM <=0.25 SENSITIVE Sensitive     NITROFURANTOIN <=16 SENSITIVE Sensitive     TRIMETH/SULFA <=20 SENSITIVE Sensitive     AMPICILLIN/SULBACTAM <=2 SENSITIVE Sensitive     PIP/TAZO <=4 SENSITIVE Sensitive     * >=100,000 COLONIES/mL ESCHERICHIA COLI     Radiology Studies: DG CHEST PORT 1 VIEW  Result Date: 08/24/2020 CLINICAL DATA:  Shortness of breath EXAM: PORTABLE CHEST 1 VIEW COMPARISON:  August 21, 2020 and May 24, 2020 FINDINGS: There is overall interstitial thickening without frank edema or consolidation. Heart is upper normal in size with pulmonary vascularity normal. No adenopathy. There is aortic atherosclerosis. No bone lesions. IMPRESSION: Interstitial thickening, likely representing a degree of chronic bronchitis. No edema or airspace opacity. Stable cardiac silhouette. Aortic Atherosclerosis (ICD10-I70.0). Electronically Signed   By: Lowella Grip III M.D.   On: 08/24/2020 10:49   Scheduled Meds: . aspirin EC  81 mg Oral Q breakfast  . carvedilol  12.5 mg Oral BID WC  . clopidogrel  75 mg Oral Q breakfast  . enoxaparin (LOVENOX) injection  40 mg Subcutaneous Q24H  . ferrous sulfate  325 mg Oral BID WC  . insulin aspart  0-15 Units Subcutaneous TID AC & HS  . insulin aspart  6 Units Subcutaneous TID AC  . insulin glargine  30 Units Subcutaneous BID  . levothyroxine  50 mcg Oral  Q0600  . magnesium oxide  400 mg Oral Daily  . methylPREDNISolone (SOLU-MEDROL) injection  40 mg Intravenous Q12H  . pantoprazole  40 mg Oral Q1200  . pregabalin  75 mg Oral Daily  . torsemide  20 mg Oral Daily  . ursodiol  300 mg Oral TID   Continuous Infusions: . cefTRIAXone (ROCEPHIN)  IV 1 g (08/24/20 1701)     LOS: 3 days   Time spent: 61min  Domenic Polite, MD [facility] 08/25/2020, 1:53 PM

## 2020-08-25 NOTE — Evaluation (Addendum)
Physical Therapy Evaluation Patient Details Name: Susan [name] MRN: [id number] DOB: Jul 16, 1950 Today's Date: 08/25/2020   History of Present Illness  Pt is a 70 yo female presenting from home 3/29 with abnormal labs (creatinine up to 3.1 from 1.73 at last admission). PMH includes: HTN, HLD, CHF, obesity, hypothyroidism, CKF, CAD, DM II, and MI. Pt recently admitted 3/22 - 3/26 with abnormal labs as well, d/c home after medical management.    Clinical Impression  Pt in bed upon arrival of PT, agreeable to evaluation at this time. Prior to admission the pt was mobilizing with use of RW for limited distances in the home, reports independent with ADLs but limited by fatigue. The pt reports ~1 fall per month at baseline, is unable to get up from floor after falling, and would benefit from continues therapy acutely and following d/c to reduce further risk of falls and improve independence with floor to chair transfers to reduce caregiver burden. The pt now presents with limitations in functional mobility, power, endurance, and dynamic stability due to above dx, and will continue to benefit from skilled PT to address these deficits. The pt is able to complete bed mobility, sit-stand transfers, and short bouts of ambulation in the room on minA-minG level, but did have x3 posterior LOB with gait requiring minA to correct and cues to maintain BUE support on RW as pt instinct is to let go and reach for therapist. Will continue to benefit from skilled PT acutely to reduce risk of falls at home and to reduce caregiver burden.      Follow Up Recommendations Home health PT;Supervision for mobility/OOB    Equipment Recommendations  None recommended by PT    Recommendations for Other Services       Precautions / Restrictions Precautions Precautions: Fall Precaution Comments: check orthostatics Restrictions Weight Bearing Restrictions: No      Mobility  Bed Mobility Overal bed mobility: Needs  Assistance Bed Mobility: Supine to Sit     Supine to sit: Supervision     General bed mobility comments: supervision with increased time and use of bed rails. no assist    Transfers Overall transfer level: Needs assistance Equipment used: Rolling walker (2 wheeled) Transfers: Sit to/from Stand Sit to Stand: Min assist         General transfer comment: minA to power up with minA to steady once in standing. x3 posterior LOB requiring assist and cues to maintain BUE on RW as pt lets go to reach for therapist when losing balance  Ambulation/Gait Ambulation/Gait assistance: Min guard;Min assist Gait Distance (Feet): 15 Feet (x2) Assistive device: Rolling walker (2 wheeled) Gait Pattern/deviations: Decreased stride length;Step-to pattern;Trunk flexed Gait velocity: decreased   General Gait Details: minG initially, but x3 LOB with pt letting go of RW to reach for PT, needing minA to steady and cues to maintain UE support with LOB     Balance Overall balance assessment: Needs assistance Sitting-balance support: No upper extremity supported;Feet supported Sitting balance-Leahy Scale: Good     Standing balance support: During functional activity;Bilateral upper extremity supported Standing balance-Leahy Scale: Poor Standing balance comment: Reliant on UE support                             Pertinent Vitals/Pain Pain Assessment: Faces Faces Pain Scale: Hurts a little bit Pain Location: "pit of my stomach" and low back Pain Descriptors / Indicators: Discomfort;Dull Pain Intervention(s): Limited activity within patient's tolerance;Monitored during  session;Repositioned    Home Living Family/patient expects to be discharged to:: Private residence Living Arrangements: Spouse/significant other;Children Available Help at Discharge: Family;Available PRN/intermittently;Other (Comment) (husband available in evening, son available during day but son has some disabilities,  can help with less exertional tasks) Type of Home: Mobile home Home Access: Ramped entrance     Home Layout: One level Home Equipment: Grab bars - tub/shower;Walker - 2 wheels;Walker - 4 wheels;Bedside commode;Cane - quad Additional Comments: pt reports using RW in home, but will take cane for community distances.    Prior Function Level of Independence: Independent with assistive device(s)         Comments: household and short distanced community ambulator using SPC most of time and RW as needed. Pt drives short distances.     Hand Dominance   Dominant Hand: Right    Extremity/Trunk Assessment   Upper Extremity Assessment Upper Extremity Assessment: Overall WFL for tasks assessed RUE Deficits / Details: Pt with rotator cuff issues.  AROM: shoulder 110 flexion and 80 abduction.  PROM WFL.  Otherwise RUE WFL    Lower Extremity Assessment Lower Extremity Assessment: Overall WFL for tasks assessed (RLE grossly 4/5, but functional with gait and transfers. transmet amputation of LLE)    Cervical / Trunk Assessment Cervical / Trunk Assessment: Kyphotic  Communication   Communication: No difficulties  Cognition Arousal/Alertness: Awake/alert Behavior During Therapy: WFL for tasks assessed/performed Overall Cognitive Status: Within Functional Limits for tasks assessed                                 General Comments: A&Ox4, very polite and motivated      General Comments General comments (skin integrity, edema, etc.): VSS on RA, pt initially needing increased time due to reports of dizziness with initial transition to sitting EOB. but able to maintain mobility without any further dizziness    Exercises     Assessment/Plan    PT Assessment Patient needs continued PT services  PT Problem List Decreased strength;Decreased knowledge of use of DME;Decreased activity tolerance;Decreased safety awareness;Decreased balance;Decreased mobility;Decreased  coordination       PT Treatment Interventions DME instruction;Balance training;Gait training;Neuromuscular re-education;Stair training;Functional mobility training;Patient/family education;Therapeutic activities;Therapeutic exercise    PT Goals (Current goals can be found in the Care Plan section)  Acute Rehab PT Goals Patient Stated Goal: go home, work on balance PT Goal Formulation: With patient Time For Goal Achievement: 09/08/20 Potential to Achieve Goals: Good    Frequency Min 3X/week    AM-PAC PT "6 Clicks" Mobility  Outcome Measure Help needed turning from your back to your side while in a flat bed without using bedrails?: None Help needed moving from lying on your back to sitting on the side of a flat bed without using bedrails?: None Help needed moving to and from a bed to a chair (including a wheelchair)?: A Little Help needed standing up from a chair using your arms (e.g., wheelchair or bedside chair)?: A Little Help needed to walk in hospital room?: A Little Help needed climbing 3-5 steps with a railing? : A Lot 6 Click Score: 19    End of Session Equipment Utilized During Treatment: Gait belt Activity Tolerance: Patient limited by fatigue;Patient tolerated treatment well Patient left: in chair;with call bell/phone within reach Nurse Communication: Mobility status PT Visit Diagnosis: Unsteadiness on feet (R26.81);Muscle weakness (generalized) (M62.81);Other abnormalities of gait and mobility (R26.89)    Time: 1335-1405 PT  Time Calculation (min) (ACUTE ONLY): 30 min   Charges:   PT Evaluation $PT Eval Low Complexity: 1 Low PT Treatments $Gait Training: 8-22 mins        Karma Ganja, PT, DPT   Acute Rehabilitation Department Pager #: [phone]  Otho Bellows 08/25/2020, 2:53 PM

## 2020-08-25 NOTE — Plan of Care (Addendum)
  Problem: Education: Goal: Knowledge of disease and its progression will improve Outcome: Progressing   Problem: Nutritional: Goal: Ability to make appropriate dietary choices will improve Outcome: Progressing   Problem: Education: Goal: Knowledge of General Education information will improve Description: Including pain rating scale, medication(s)/side effects and non-pharmacologic comfort measures Outcome: Progressing   Problem: Health Behavior/Discharge Planning: Goal: Ability to manage health-related needs will improve Outcome: Progressing   Problem: Clinical Measurements: Goal: Ability to maintain clinical measurements within normal limits will improve Outcome: Progressing Goal: Will remain free from infection Outcome: Progressing Goal: Diagnostic test results will improve Outcome: Progressing Goal: Respiratory complications will improve Outcome: Progressing Goal: Cardiovascular complication will be avoided Outcome: Progressing   Problem: Activity: Goal: Risk for activity intolerance will decrease Outcome: Progressing   Problem: Nutrition: Goal: Adequate nutrition will be maintained Outcome: Progressing   Problem: Coping: Goal: Level of anxiety will decrease Outcome: Progressing   Problem: Elimination: Goal: Will not experience complications related to bowel motility Outcome: Progressing Goal: Will not experience complications related to urinary retention Outcome: Progressing   Problem: Pain Managment: Goal: General experience of comfort will improve Outcome: Progressing   Problem: Safety: Goal: Ability to remain free from injury will improve Outcome: Progressing   Problem: Skin Integrity: Goal: Risk for impaired skin integrity will decrease Outcome: Progressing

## 2020-08-25 NOTE — Progress Notes (Signed)
[facility] night shift.  The patient asked the nursing staff to resume her trazodone at bedtime.  Her QT prolongation resolved 2 days ago after magnesium supplementation.  Trazodone 150 mg p.o. nightly restarted.  Tennis Must, MD.

## 2020-08-26 DIAGNOSIS — E1142 Type 2 diabetes mellitus with diabetic polyneuropathy: Secondary | ICD-10-CM

## 2020-08-26 DIAGNOSIS — Z794 Long term (current) use of insulin: Secondary | ICD-10-CM

## 2020-08-26 DIAGNOSIS — I2510 Atherosclerotic heart disease of native coronary artery without angina pectoris: Secondary | ICD-10-CM

## 2020-08-26 LAB — BASIC METABOLIC PANEL
Anion gap: 7 (ref 5–15)
BUN: 45 mg/dL — ABNORMAL HIGH (ref 8–23)
CO2: 26 mmol/L (ref 22–32)
Calcium: 9.2 mg/dL (ref 8.9–10.3)
Chloride: 102 mmol/L (ref 98–111)
Creatinine, Ser: 1.85 mg/dL — ABNORMAL HIGH (ref 0.44–1.00)
GFR, Estimated: 29 mL/min — ABNORMAL LOW (ref >60)
Glucose, Bld: 318 mg/dL — ABNORMAL HIGH (ref 70–99)
Potassium: 4.6 mmol/L (ref 3.5–5.1)
Sodium: 135 mmol/L (ref 135–145)

## 2020-08-26 LAB — GLUCOSE, CAPILLARY
Glucose-Capillary: 309 mg/dL — ABNORMAL HIGH (ref 70–99)
Glucose-Capillary: 294 mg/dL — ABNORMAL HIGH (ref 70–99)
Glucose-Capillary: 222 mg/dL — ABNORMAL HIGH (ref 70–99)
Glucose-Capillary: 424 mg/dL — ABNORMAL HIGH (ref 70–99)
Glucose-Capillary: 433 mg/dL — ABNORMAL HIGH (ref 70–99)

## 2020-08-26 LAB — CBC
HCT: 38.5 % (ref 36.0–46.0)
Hemoglobin: 12.1 g/dL (ref 12.0–15.0)
MCH: 27 pg (ref 26.0–34.0)
MCHC: 31.4 g/dL (ref 30.0–36.0)
MCV: 85.9 fL (ref 80.0–100.0)
Platelets: 235 10*3/uL (ref 150–400)
RBC: 4.48 MIL/uL (ref 3.87–5.11)
RDW: 16.5 % — ABNORMAL HIGH (ref 11.5–15.5)
WBC: 5.5 10*3/uL (ref 4.0–10.5)
nRBC: 0 % (ref 0.0–0.2)

## 2020-08-26 LAB — HEMOGLOBIN A1C
Hgb A1c MFr Bld: 10.3 % — ABNORMAL HIGH (ref 4.8–5.6)
Mean Plasma Glucose: 248.91 mg/dL

## 2020-08-26 MED ORDER — INSULIN GLARGINE 100 UNIT/ML ~~LOC~~ SOLN
35.0000 [IU] | Freq: Two times a day (BID) | SUBCUTANEOUS | Status: DC
  Administered 2020-08-26: 35 [IU] via SUBCUTANEOUS
  Filled 2020-08-26 (×2): qty 0.35

## 2020-08-26 MED ORDER — INSULIN ASPART 100 UNIT/ML IV SOLN
8.0000 [IU] | Freq: Three times a day (TID) | INTRAVENOUS | Status: DC
  Administered 2020-08-26: 8 [IU] via SUBCUTANEOUS

## 2020-08-26 MED ORDER — SODIUM CHLORIDE 0.9 % IV SOLN
1.0000 g | INTRAVENOUS | Status: DC
  Administered 2020-08-26: 1 g via INTRAVENOUS
  Filled 2020-08-26 (×2): qty 10

## 2020-08-26 MED ORDER — INSULIN ASPART 100 UNIT/ML ~~LOC~~ SOLN
15.0000 [IU] | Freq: Once | SUBCUTANEOUS | Status: AC
  Administered 2020-08-26: 15 [IU] via SUBCUTANEOUS

## 2020-08-26 MED ORDER — PREDNISONE 20 MG PO TABS
40.0000 mg | ORAL_TABLET | Freq: Every day | ORAL | Status: DC
  Administered 2020-08-26: 40 mg via ORAL
  Filled 2020-08-26: qty 2

## 2020-08-26 MED ORDER — INSULIN GLARGINE 100 UNIT/ML ~~LOC~~ SOLN
40.0000 [IU] | Freq: Two times a day (BID) | SUBCUTANEOUS | Status: DC
  Administered 2020-08-26 – 2020-08-27 (×2): 40 [IU] via SUBCUTANEOUS
  Filled 2020-08-26 (×3): qty 0.4

## 2020-08-26 NOTE — Progress Notes (Signed)
PROGRESS NOTE    Alsha Meland  KZS:[id number] DOB: 14-Sep-1950 DOA: 08/21/2020 PCP: [name], MD  Brief Narrative:70/F with history of chronic diastolic CHF, CAD with PCI and stents, CKD stage IV, hypertension, pulmonary hypertension, peripheral neuropathy, recently hospitalized from 3/22-3/26 secondary to diastolic CHF and AKI , improved with diuretics creatinine improved from 4.1 on admission down to 1.78 at discharge, she felt well for the first few days, on 3/29 noticed that her urine output was low, also had labs done by her PCPs office which noted worsening creatinine and was advised to come to the ED. -In the emergency room she was noted to have acute kidney injury with creatinine trending up to 3.1 from 1.73 days ago, urinalysis was abnormal, she started on IV fluids and IV ceftriaxone and admitted                                                                                           Assessment & Plan:   AKI on CKD4 Hyperkalemia -Suspect she had ATN last admission and had resolving ATN at the time of discharge, creatinine trended up to 3.1 from 1.8 few days after discharge -Baseline creatinine around 1.5-2 range -CT without hydronephrosis -She was given IV fluids briefly on admission for 12h -Clinically appears euvolemic, she is -5.5 L this admission, creatinine down to 1.8 -Restarted torsemide 20 mg daily -Discharge planning  Acute bronchitis  -Productive cough wheezing chest tightness for 2 days,  -Former smoker, chest x-ray does not indicate fluid overload, clinically suspect acute bronchitis -Improving with steroids duo nebs, changed to prednisone today -Increase activity  Chronic diastolic CHF -Last echo with preserved EF 73-22%, grade 1 diastolic dysfunction -Diuresed aggressively last week and discharged home on torsemide 80 mg twice daily, readmitted in few days with bump in creatinine -Clinically appears euvolemic, torsemide restarted at 20 mg daily yesterday,  she is auto diuresing, -5.5 L this admission -Will discuss diuretic dose with cardiology tomorrow  E. coli UTI -Continue IV ceftriaxone day 4, culture with pansensitive E. Coli -In addition patient also has bronchitis symptoms, continue ceftriaxone for 1-2 more days  History of CAD -Prior history of PCI, BMS in 2007 and low risk nuclear study in 2018 -Continue aspirin, Plavix, carvedilol  Uncontrolled type 1 diabetes mellitus -Discontinued insulin pump -CBGs poorly controlled, increase Lantus to 35 units twice daily and NovoLog with meals  -Diabetes coordinator consult appreciated  History of transmetatarsal amputation  Prolonged QTC -Improved on repeat EKG  DVT prophylaxis:lovenox Code Status: Full Code Family Communication:none at bedside Disposition Plan:  Status is: Inpatient  Remains inpatient appropriate because:Inpatient level of care appropriate due to severity of illness   Dispo: The patient is from: Home              Anticipated d/c is to: Home hopefully tomorrow              Patient currently is not medically stable to d/c.   Difficult to place patient No  Consultants   Procedures:   Antimicrobials:    Subjective: -Breathing better today, still with productive cough, less wheezing, Objective: Vitals:   08/25/20  2250 08/26/20 0530 08/26/20 0816 08/26/20 0959  BP: 131/81 (!) 162/85 (!) 156/80 126/74  Pulse: 76 88 80 70  Resp: 18 16  18   Temp: 98.4 F (36.9 C) 97.6 F (36.4 C)  97.8 F (36.6 C)  TempSrc:  Oral    SpO2: 100% 95%  98%  Weight:      Height:        Intake/Output Summary (Last 24 hours) at 08/26/2020 1140 Last data filed at 08/26/2020 1000 Gross per 24 hour  Intake 960 ml  Output 1150 ml  Net -190 ml   Filed Weights   08/22/20 1950  Weight: 116 kg    Examination:  General exam: Obese pleasant female sitting up in bed, AAOx3, no distress HEENT: Neck obese, no JVD CVS: S1-S2, regular rate rhythm Lungs: Few scattered wheezes,  no rales Abdomen: Soft, obese, nontender, bowel sounds present  Extremities: Trace edema, bilateral venous stasis changes, right trans-metatarsal amputation Psychiatry: Mood & affect appropriate.   Data Reviewed:   CBC: Recent Labs  Lab 08/22/20 0602 08/23/20 0431 08/24/20 0438 08/25/20 0335 08/26/20 0801  WBC 9.2 7.1 5.9 5.4 5.5  NEUTROABS 6.8  --   --   --   --   HGB 10.9* 10.3* 10.4* 11.3* 12.1  HCT 36.3 33.1* 33.6* 37.6 38.5  MCV 88.1 87.8 85.1 89.7 85.9  PLT 174 161 172 192 865   Basic Metabolic Panel: Recent Labs  Lab 08/22/20 0602 08/22/20 1615 08/23/20 0431 08/24/20 0438 08/25/20 0335 08/26/20 0801  NA 132* 135 137 136 135 135  K 5.6* 4.5 4.7 4.1 4.2 4.6  CL 98 99 103 102 105 102  CO2 22 26 26 26  21* 26  GLUCOSE 324* 289* 274* 305* 310* 318*  BUN 80* 75* 73* 69* 54* 45*  CREATININE 2.73* 2.62* 2.50* 1.95* 1.81* 1.85*  CALCIUM 8.5* 8.7* 8.8* 8.7* 8.8* 9.2  MG 2.2  --   --   --   --   --   PHOS 4.8*  --   --   --   --   --    GFR: Estimated Creatinine Clearance: 36.1 mL/min (A) (by C-G formula based on SCr of 1.85 mg/dL (H)). Liver Function Tests: Recent Labs  Lab 08/21/20 1948 08/22/20 0602  AST 23 39  ALT 16 9  ALKPHOS 207* 189*  BILITOT 0.5 1.2  PROT 6.3* 5.7*  ALBUMIN 3.2* 2.8*   Recent Labs  Lab 08/21/20 1948  LIPASE 34   No results for input(s): AMMONIA in the last 168 hours. Coagulation Profile: No results for input(s): INR, PROTIME in the last 168 hours. Cardiac Enzymes: No results for input(s): CKTOTAL, CKMB, CKMBINDEX, TROPONINI in the last 168 hours. BNP (last 3 results) No results for input(s): PROBNP in the last 8760 hours. HbA1C: Recent Labs    08/26/20 0801  HGBA1C 10.3*   CBG: Recent Labs  Lab 08/25/20 1636 08/25/20 2123 08/26/20 0533 08/26/20 0819 08/26/20 1136  GLUCAP 238* 399* 309* 294* 222*   Lipid Profile: No results for input(s): CHOL, HDL, LDLCALC, TRIG, CHOLHDL, LDLDIRECT in the last 72 hours. Thyroid  Function Tests: No results for input(s): TSH, T4TOTAL, FREET4, T3FREE, THYROIDAB in the last 72 hours. Anemia Panel: No results for input(s): VITAMINB12, FOLATE, FERRITIN, TIBC, IRON, RETICCTPCT in the last 72 hours. Urine analysis:    Component Value Date/Time   COLORURINE YELLOW 08/22/2020 0100   APPEARANCEUR HAZY (A) 08/22/2020 0100   LABSPEC 1.011 08/22/2020 0100   PHURINE 5.0 08/22/2020  0100   GLUCOSEU NEGATIVE 08/22/2020 0100   HGBUR NEGATIVE 08/22/2020 0100   BILIRUBINUR NEGATIVE 08/22/2020 0100   BILIRUBINUR negative 06/05/2020 1334   KETONESUR NEGATIVE 08/22/2020 0100   PROTEINUR 100 (A) 08/22/2020 0100   UROBILINOGEN 0.2 06/05/2020 1334   UROBILINOGEN 0.2 04/01/2014 1659   NITRITE NEGATIVE 08/22/2020 0100   LEUKOCYTESUR LARGE (A) 08/22/2020 0100   Sepsis Labs: @LABRCNTIP (procalcitonin:4,lacticidven:4)  ) Recent Results (from the past 240 hour(s))  Resp Panel by RT-PCR (Flu A&B, Covid) Nasopharyngeal Swab     Status: None   Collection Time: 08/21/20 11:06 PM   Specimen: Nasopharyngeal Swab; Nasopharyngeal(NP) swabs in vial transport medium  Result Value Ref Range Status   SARS Coronavirus 2 by RT PCR NEGATIVE NEGATIVE Final    Comment: (NOTE) SARS-CoV-2 target nucleic acids are NOT DETECTED.  The SARS-CoV-2 RNA is generally detectable in upper respiratory specimens during the acute phase of infection. The lowest concentration of SARS-CoV-2 viral copies this assay can detect is 138 copies/mL. A negative result does not preclude SARS-Cov-2 infection and should not be used as the sole basis for treatment or other patient management decisions. A negative result may occur with  improper specimen collection/handling, submission of specimen other than nasopharyngeal swab, presence of viral mutation(s) within the areas targeted by this assay, and inadequate number of viral copies(<138 copies/mL). A negative result must be combined with clinical observations, patient  history, and epidemiological information. The expected result is Negative.  Fact Sheet for Patients:  EntrepreneurPulse.com.au  Fact Sheet for Healthcare Providers:  IncredibleEmployment.be  This test is no t yet approved or cleared by the [place] FDA and  has been authorized for detection and/or diagnosis of SARS-CoV-2 by FDA under an Emergency Use Authorization (EUA). This EUA will remain  in effect (meaning this test can be used) for the duration of the COVID-19 declaration under Section 564(b)(1) of the Act, 21 U.S.C.section 360bbb-3(b)(1), unless the authorization is terminated  or revoked sooner.       Influenza A by PCR NEGATIVE NEGATIVE Final   Influenza B by PCR NEGATIVE NEGATIVE Final    Comment: (NOTE) The Xpert Xpress SARS-CoV-2/FLU/RSV plus assay is intended as an aid in the diagnosis of influenza from Nasopharyngeal swab specimens and should not be used as a sole basis for treatment. Nasal washings and aspirates are unacceptable for Xpert Xpress SARS-CoV-2/FLU/RSV testing.  Fact Sheet for Patients: EntrepreneurPulse.com.au  Fact Sheet for Healthcare Providers: IncredibleEmployment.be  This test is not yet approved or cleared by the [place] FDA and has been authorized for detection and/or diagnosis of SARS-CoV-2 by FDA under an Emergency Use Authorization (EUA). This EUA will remain in effect (meaning this test can be used) for the duration of the COVID-19 declaration under Section 564(b)(1) of the Act, 21 U.S.C. section 360bbb-3(b)(1), unless the authorization is terminated or revoked.  Performed at [facility], [place] [address], [place], [place] [address]   Urine culture     Status: Abnormal   Collection Time: 08/22/20 12:45 AM   Specimen: Urine, Random  Result Value Ref Range Status   Specimen Description URINE, RANDOM  Final   Special Requests   Final     NONE Performed at [facility], [place] [address], [place], [place] [address]    Culture >=100,000 COLONIES/mL ESCHERICHIA COLI (A)  Final   Report Status 08/24/2020 FINAL  Final   Organism ID, Bacteria ESCHERICHIA COLI (A)  Final      Susceptibility   Escherichia  coli - MIC*    AMPICILLIN 8 SENSITIVE Sensitive     CEFAZOLIN <=4 SENSITIVE Sensitive     CEFEPIME <=0.12 SENSITIVE Sensitive     CEFTRIAXONE <=0.25 SENSITIVE Sensitive     CIPROFLOXACIN <=0.25 SENSITIVE Sensitive     GENTAMICIN <=1 SENSITIVE Sensitive     IMIPENEM <=0.25 SENSITIVE Sensitive     NITROFURANTOIN <=16 SENSITIVE Sensitive     TRIMETH/SULFA <=20 SENSITIVE Sensitive     AMPICILLIN/SULBACTAM <=2 SENSITIVE Sensitive     PIP/TAZO <=4 SENSITIVE Sensitive     * >=100,000 COLONIES/mL ESCHERICHIA COLI     Radiology Studies: No results found. Scheduled Meds: . aspirin EC  81 mg Oral Q breakfast  . carvedilol  12.5 mg Oral BID WC  . clopidogrel  75 mg Oral Q breakfast  . enoxaparin (LOVENOX) injection  40 mg Subcutaneous Q24H  . ferrous sulfate  325 mg Oral BID WC  . insulin aspart  0-15 Units Subcutaneous TID AC & HS  . insulin aspart  6 Units Subcutaneous TID AC  . insulin glargine  35 Units Subcutaneous BID  . levothyroxine  50 mcg Oral Q0600  . magnesium oxide  400 mg Oral Daily  . pantoprazole  40 mg Oral Q1200  . predniSONE  40 mg Oral Q breakfast  . pregabalin  75 mg Oral Daily  . torsemide  20 mg Oral Daily  . traZODone  150 mg Oral QHS  . ursodiol  300 mg Oral TID   Continuous Infusions: . cefTRIAXone (ROCEPHIN)  IV 1 g (08/25/20 1527)     LOS: 4 days   Time spent: 81min  Domenic Polite, MD [facility] 08/26/2020, 11:40 AM

## 2020-08-26 NOTE — Progress Notes (Signed)
TRH night shift.  The nursing staff reported that the patient's CBG was 433 mg/dL.  The patient is getting prednisone 40 mg p.o. daily.  She is on Lantus 40 units SQ twice daily and preprandial RI sliding scale.  NovoLog 15 units SQ ordered.  Tennis Must, MD

## 2020-08-26 NOTE — Progress Notes (Signed)
Pt requesting trazodone for sleep. She states she takes this at home and she has not been sleeping well. MD notified.

## 2020-08-26 NOTE — Progress Notes (Signed)
Patient's CBG was 424 at 1633, MD was notified and new orders were place, RN implemented new orders and will continue to monitor this patient.

## 2020-08-27 LAB — BASIC METABOLIC PANEL
Anion gap: 11 (ref 5–15)
BUN: 52 mg/dL — ABNORMAL HIGH (ref 8–23)
CO2: 22 mmol/L (ref 22–32)
Calcium: 9.2 mg/dL (ref 8.9–10.3)
Chloride: 103 mmol/L (ref 98–111)
Creatinine, Ser: 1.81 mg/dL — ABNORMAL HIGH (ref 0.44–1.00)
GFR, Estimated: 30 mL/min — ABNORMAL LOW (ref >60)
Glucose, Bld: 160 mg/dL — ABNORMAL HIGH (ref 70–99)
Potassium: 4.5 mmol/L (ref 3.5–5.1)
Sodium: 136 mmol/L (ref 135–145)

## 2020-08-27 LAB — GLUCOSE, CAPILLARY
Glucose-Capillary: 170 mg/dL — ABNORMAL HIGH (ref 70–99)
Glucose-Capillary: 141 mg/dL — ABNORMAL HIGH (ref 70–99)
Glucose-Capillary: 297 mg/dL — ABNORMAL HIGH (ref 70–99)

## 2020-08-27 MED ORDER — TORSEMIDE 20 MG PO TABS: 80.0000 mg | ORAL_TABLET | Freq: Every day | ORAL | 0 refills | Status: DC

## 2020-08-27 MED ORDER — INSULIN ASPART 100 UNIT/ML IV SOLN
6.0000 [IU] | Freq: Three times a day (TID) | INTRAVENOUS | Status: DC
  Administered 2020-08-27 (×3): 6 [IU] via SUBCUTANEOUS

## 2020-08-27 MED ORDER — PREDNISONE 20 MG PO TABS
20.0000 mg | ORAL_TABLET | Freq: Every day | ORAL | Status: DC
  Administered 2020-08-27: 20 mg via ORAL
  Filled 2020-08-27: qty 1

## 2020-08-27 NOTE — Care Management Important Message (Signed)
Important Message  Patient Details  Name: Susan [name] MRN: [id number] Date of Birth: [birth year]   Medicare Important Message Given:  Yes     Devanee Pomplun P Nomar Broad 08/27/2020, 2:45 PM

## 2020-08-27 NOTE — Progress Notes (Signed)
DISCHARGE NOTE HOME Susan [name] to be discharged Home per MD order. Discussed prescriptions and follow up appointments with the patient. Prescriptions given to patient; medication list explained in detail. Patient verbalized understanding.  Skin clean, dry and intact without evidence of skin break down, no evidence of skin tears noted. IV catheter discontinued intact. Site without signs and symptoms of complications. Dressing and pressure applied. Pt denies pain at the site currently. No complaints noted.  Patient free of lines, drains, and wounds.   An After Visit Summary (AVS) was printed and given to the patient. Patient escorted via wheelchair, and discharged home via private auto.  Dolores Hoose, RN

## 2020-08-27 NOTE — Plan of Care (Signed)
  Problem: Activity: Goal: Risk for activity intolerance will decrease Outcome: Progressing   Problem: Coping: Goal: Level of anxiety will decrease Outcome: Progressing   

## 2020-08-27 NOTE — Progress Notes (Signed)
Physical Therapy Treatment Patient Details Name: Susan [name] MRN: [id number] DOB: 20-Nov-1950 Today's Date: 08/27/2020    History of Present Illness Pt is a 70 yo female presenting from home 3/29 with abnormal labs (creatinine up to 3.1 from 1.73 at last admission). PMH includes: HTN, HLD, CHF, obesity, hypothyroidism, CKF, CAD, DM II, and MI. Pt recently admitted 3/22 - 3/26 with abnormal labs as well, d/c home after medical management.    PT Comments    Pt received in bed, cooperative and pleasant. Generally supervision to min guard for mobility. Good carryover of hand placement during transfers. Able to progress gait distance today and pt needed less assistance. Pt steady with RW. Left in chair with all needs met and call bell within reach. Pt would benefit from continued PT to improve endurance and increase independency. Will continue to follow acutely.    Follow Up Recommendations  Home health PT;Supervision for mobility/OOB     Equipment Recommendations  None recommended by PT    Recommendations for Other Services       Precautions / Restrictions Precautions Precautions: Fall Restrictions Weight Bearing Restrictions: No    Mobility  Bed Mobility Overal bed mobility: Needs Assistance Bed Mobility: Supine to Sit     Supine to sit: Supervision     General bed mobility comments: Supervision, no use of bed rails, no physical assist    Transfers Overall transfer level: Needs assistance Equipment used: Rolling walker (2 wheeled) Transfers: Sit to/from Stand Sit to Stand: Min guard         General transfer comment: Min guard for safety, no physical assist given  Ambulation/Gait Ambulation/Gait assistance: Min guard Gait Distance (Feet): 60 Feet Assistive device: Rolling walker (2 wheeled) Gait Pattern/deviations: Decreased stride length;Step-to pattern;Trunk flexed Gait velocity: decreased   General Gait Details: Min guard for safety, no LOB, cueing for  pacing   Stairs             Wheelchair Mobility    Modified Rankin (Stroke Patients Only)       Balance Overall balance assessment: Needs assistance Sitting-balance support: No upper extremity supported;Feet supported Sitting balance-Leahy Scale: Good     Standing balance support: During functional activity;Bilateral upper extremity supported Standing balance-Leahy Scale: Poor Standing balance comment: Able to stand with only 1 UE supported to pull on brief, reliant on UE support during ambulation                            Cognition Arousal/Alertness: Awake/alert Behavior During Therapy: WFL for tasks assessed/performed Overall Cognitive Status: Within Functional Limits for tasks assessed                                 General Comments: very polite and motivated      Exercises      General Comments General comments (skin integrity, edema, etc.): VSS on RA      Pertinent Vitals/Pain Pain Assessment: No/denies pain    Home Living                      Prior Function            PT Goals (current goals can now be found in the care plan section) Acute Rehab PT Goals Patient Stated Goal: go home, work on balance PT Goal Formulation: With patient Time For Goal Achievement: 08/30/20 Potential to Achieve  Goals: Good Progress towards PT goals: Progressing toward goals    Frequency    Min 3X/week      PT Plan      Co-evaluation              AM-PAC PT "6 Clicks" Mobility   Outcome Measure  Help needed turning from your back to your side while in a flat bed without using bedrails?: None Help needed moving from lying on your back to sitting on the side of a flat bed without using bedrails?: None Help needed moving to and from a bed to a chair (including a wheelchair)?: A Little Help needed standing up from a chair using your arms (e.g., wheelchair or bedside chair)?: A Little Help needed to walk in hospital  room?: A Little Help needed climbing 3-5 steps with a railing? : A Lot 6 Click Score: 19    End of Session Equipment Utilized During Treatment: Gait belt Activity Tolerance: Patient tolerated treatment well Patient left: in chair;with call bell/phone within reach Nurse Communication: Mobility status PT Visit Diagnosis: Unsteadiness on feet (R26.81);Muscle weakness (generalized) (M62.81);Other abnormalities of gait and mobility (R26.89)     Time:  -     Charges:                        Rosita Kea, SPT

## 2020-08-27 NOTE — Plan of Care (Signed)
  Problem: Education: Goal: Knowledge of disease and its progression will improve Outcome: Adequate for Discharge   Problem: Nutritional: Goal: Ability to make appropriate dietary choices will improve Outcome: Adequate for Discharge   Problem: Education: Goal: Knowledge of General Education information will improve Description: Including pain rating scale, medication(s)/side effects and non-pharmacologic comfort measures Outcome: Adequate for Discharge   Problem: Health Behavior/Discharge Planning: Goal: Ability to manage health-related needs will improve Outcome: Adequate for Discharge   Problem: Clinical Measurements: Goal: Ability to maintain clinical measurements within normal limits will improve Outcome: Adequate for Discharge Goal: Will remain free from infection Outcome: Adequate for Discharge Goal: Diagnostic test results will improve Outcome: Adequate for Discharge Goal: Respiratory complications will improve Outcome: Adequate for Discharge Goal: Cardiovascular complication will be avoided Outcome: Adequate for Discharge   Problem: Activity: Goal: Risk for activity intolerance will decrease 08/27/2020 1748 by Dolores Hoose, RN Outcome: Adequate for Discharge 08/27/2020 0808 by Dolores Hoose, RN Outcome: Progressing   Problem: Nutrition: Goal: Adequate nutrition will be maintained Outcome: Adequate for Discharge   Problem: Coping: Goal: Level of anxiety will decrease 08/27/2020 1748 by Dolores Hoose, RN Outcome: Adequate for Discharge 08/27/2020 0808 by Dolores Hoose, RN Outcome: Progressing   Problem: Elimination: Goal: Will not experience complications related to bowel motility Outcome: Adequate for Discharge Goal: Will not experience complications related to urinary retention Outcome: Adequate for Discharge   Problem: Pain Managment: Goal: General experience of comfort will improve Outcome: Adequate for Discharge   Problem: Safety: Goal:  Ability to remain free from injury will improve Outcome: Adequate for Discharge   Problem: Skin Integrity: Goal: Risk for impaired skin integrity will decrease Outcome: Adequate for Discharge

## 2020-08-27 NOTE — Discharge Summary (Signed)
Physician Discharge Summary  Susan [name] OIN:[id number] DOB: [birth year] DOA: 08/21/2020  PCP: [name], MD  Admit date: 08/21/2020 Discharge date: 08/27/2020  Time spent: 35 minutes  Recommendations for Outpatient Follow-up:  1. PCP in 2 weeks 2. CHF team Darrick Grinder, NP on 4/12 at 1:30 PM, please check BMP at follow-up   Discharge Diagnoses:  Principal Problem:   Acute renal failure superimposed on stage 4 chronic kidney disease ([place]) Acute bronchitis Morbid obesity Uncontrolled type 2 diabetes mellitus with hyperglycemia   Hyperlipemia   Essential hypertension   Coronary artery disease involving native coronary artery of native heart without angina pectoris   Chronic diastolic CHF (congestive heart failure) (HCC)   E-coli UTI   Hyperkalemia   Prolonged QT interval   Type 2 diabetes mellitus with diabetic polyneuropathy, with long-term current use of insulin ([place])   Discharge Condition: Stable  Diet recommendation: Low-sodium, diabetic  Filed Weights   08/22/20 1950  Weight: 116 kg    History of present illness:  70/F with history of chronic diastolic CHF, CAD with PCI and stents, CKD stage IV, hypertension, pulmonary hypertension, peripheral neuropathy, recently hospitalized from 3/22-3/26 secondary to diastolic CHF and AKI , improved with diuretics creatinine improved from 4.1 on admission down to 1.78 at discharge, she felt well for the first few days, on 3/29 noticed that her urine output was low, also had labs done by her PCPs office which noted worsening creatinine and was advised to come to the ED. -In the emergency room she was noted to have acute kidney injury with creatinine trending up to 3.1 from 1.7, 3 days ago, urinalysis was abnormal, she started on IV fluids and IV ceftriaxone and admitted                            Hospital Course:   AKI on CKD4 Hyperkalemia -Suspect she had ATN last admission and had resolving ATN at the time of discharge,  creatinine trended up to 3.1 from 1.8 few days after discharge last week -Baseline creatinine around 1.5-2 range -CT without hydronephrosis -She was given IV fluids briefly on admission for 12h then discontinued -Subsequently has remained euvolemic, she is -5.5 L this admission, creatinine down to 1.8, restarted torsemide 20 mg daily -See discussion below, she will discharged home on torsemide 80 mg daily (previously was on 80mg  BID), FU labs next week  Acute bronchitis  -Productive cough wheezing chest tightness for 3 days,  -Former smoker, chest x-ray does not indicate fluid overload, clinically suspect acute bronchitis -Treated with ceftriaxone duo nebs and steroids x3days with good clinical response, discontinued steroids on discharge on account of recurrent hyperglycemia  Chronic diastolic CHF -Last echo with preserved EF 70-96%, grade 1 diastolic dysfunction -Diuresed aggressively last week and discharged home on torsemide 80 mg twice daily, readmitted in few days with bump in creatinine -Clinically appears euvolemic, torsemide restarted at 20 mg daily yesterday, she is auto diuresing, -5.5 L this admission -Discussed with Dr.Bensimhon her primary cardiologist, patient had been on torsemide 60 mg twice daily previously and 80 mg twice daily last week at discharge prior to admission with AKI -She will be discharged home on torsemide 80 mg daily, counseled regarding importance of daily weights, salt restriction etc. -Follow-up with CHF team next week on 4/12 at 1:30 PM  E. coli UTI -Completed ceftriaxone course  History of CAD -Prior history of PCI, BMS in 2007 and low risk nuclear study  in 2018 -Continue aspirin, Plavix, carvedilol  Uncontrolled type 1 diabetes mellitus -CBGs poorly controlled, hemoglobin A1c is 10.3, this is however improved from last few months -Followed by Dr. [name], and then started on an insulin pump few months prior to admission, this was resumed at  discharge  History of transmetatarsal amputation  Prolonged QTC -Improved on repeat EKG  Discharge Exam: Vitals:   08/27/20 0556 08/27/20 0941  BP: (!) 167/85 (!) 146/73  Pulse: 85 82  Resp: 16 18  Temp: (!) 97.5 F (36.4 C) 97.7 F (36.5 C)  SpO2: 97% 92%    General: AAOx3 Cardiovascular: S1S2/RRR Respiratory: CTAB  Discharge Instructions   Discharge Instructions    Diet - low sodium heart healthy   Complete by: As directed    Diet Carb Modified   Complete by: As directed    Discharge wound care:   Complete by: As directed    routine   Increase activity slowly   Complete by: As directed      Allergies as of 08/27/2020      Reactions   Codeine Nausea And Vomiting      Medication List    TAKE these medications   acetaminophen 500 MG tablet Commonly known as: TYLENOL Take 1,000 mg by mouth every 6 (six) hours as needed for moderate pain or headache.   AIRBORNE GUMMIES PO Take 2 tablets by mouth every evening.   Centrum Silver 50+Women Tabs Take 1 tablet by mouth every evening.   albuterol 108 (90 Base) MCG/ACT inhaler Commonly known as: VENTOLIN HFA TAKE 2 PUFFS BY MOUTH EVERY 6 HOURS AS NEEDED FOR WHEEZE OR SHORTNESS OF BREATH What changed: See the new instructions.   allopurinol 100 MG tablet Commonly known as: ZYLOPRIM TAKE 1 TABLET BY MOUTH TWICE A DAY   aspirin EC 81 MG tablet Take 1 tablet (81 mg total) by mouth daily with breakfast.   carvedilol 12.5 MG tablet Commonly known as: COREG TAKE 1 TABLET (12.5 MG TOTAL) BY MOUTH 2 (TWO) TIMES DAILY WITH A MEAL.   cholecalciferol 1000 units tablet Commonly known as: VITAMIN D Take 1,000 Units by mouth daily with supper.   clopidogrel 75 MG tablet Commonly known as: PLAVIX TAKE 1 TABLET BY MOUTH DAILY WITH BREAKFAST.   ferrous sulfate 325 (65 FE) MG tablet Take 1 tablet (325 mg total) by mouth 2 (two) times daily with a meal. Reported on 08/21/2015   FLUoxetine 20 MG capsule Commonly  known as: PROZAC Take 10 mg by mouth daily.   guaiFENesin-dextromethorphan 100-10 MG/5ML syrup Commonly known as: ROBITUSSIN DM Take 10 mLs by mouth every 4 (four) hours as needed for cough.   hydrocerin Crea Apply 1 application topically 2 (two) times daily.   insulin NPH Human 100 UNIT/ML injection Commonly known as: NOVOLIN N Inject 15 Units into the skin See admin instructions. Inject 15 units subcutaneously in the morning if CBG >300;   insulin regular 100 units/mL injection Commonly known as: NOVOLIN R Inject into the skin See admin instructions. Manually add bolus to continuous dose via OmniPod 3 times daily per sliding scale (CBG 80-150 7 units, 151-200 9 units, 201-250 12 units, 251-300 14 units, 301- 400 17 units)   levothyroxine 50 MCG tablet Commonly known as: SYNTHROID Take 1 tablet (50 mcg total) by mouth daily before breakfast.   magnesium oxide 400 MG tablet Commonly known as: MAG-OX TAKE 1 TABLET BY MOUTH EVERY DAY   miconazole 2 % powder Commonly known as: MICOTIN Apply  topically as needed for itching.   nitroGLYCERIN 0.4 MG SL tablet Commonly known as: NITROSTAT Place 1 tablet (0.4 mg total) under the tongue every 5 (five) minutes as needed for chest pain.   OmniPod Dash 5 Pack Pods Misc Inject into the skin See admin instructions. Use continuously with Novolin R - change every 72 hours.   ondansetron 4 MG tablet Commonly known as: Zofran Take 1 tablet (4 mg total) by mouth every 8 (eight) hours as needed for nausea or vomiting.   OneTouch Verio test strip Generic drug: glucose blood 3 (three) times daily.   polyethylene glycol 17 g packet Commonly known as: MIRALAX / GLYCOLAX Take 17 g by mouth daily.   potassium chloride SA 20 MEQ tablet Commonly known as: KLOR-CON Take 20 mEq by mouth daily.   pregabalin 150 MG capsule Commonly known as: Lyrica Take 1 capsule (150 mg total) by mouth 2 (two) times daily.   torsemide 20 MG tablet Commonly  known as: DEMADEX Take 4 tablets (80 mg total) by mouth daily. What changed:   when to take this  additional instructions   traZODone 100 MG tablet Commonly known as: DESYREL TAKE 1 AND 1/2 TABLETS BY MOUTH AT BEDTIME AS NEEDED FOR SLEEP. What changed: See the new instructions.   ursodiol 500 MG tablet Commonly known as: ACTIGALL Take 500 mg by mouth 3 (three) times daily.   vitamin C 1000 MG tablet Take 1,000 mg by mouth daily.   Xeroform Petrolat Gauze 5"x9" Misc Apply 1 each topically 2 (two) times daily. For dressing changes   ZINC PO Take 1 tablet by mouth every evening.            Discharge Care Instructions  (From admission, onward)         Start     Ordered   08/27/20 0000  Discharge wound care:       Comments: routine   08/27/20 1102         Allergies  Allergen Reactions  . Codeine Nausea And Vomiting    Follow-up Information    [name], MD Follow up in 1 week(s).   Specialty: Family Medicine Contact information: [name] [place] [address] [phone]        Bensimhon, Shaune Pascal, MD Follow up on 09/04/2020.   Specialty: Cardiology Why: at 1:30pm Contact information: [address] [place] [address] [phone]                The results of significant diagnostics from this hospitalization (including imaging, microbiology, ancillary and laboratory) are listed below for reference.    Significant Diagnostic Studies: CT ABDOMEN PELVIS WO CONTRAST  Result Date: 08/22/2020 CLINICAL DATA:  Flank pain, urinary tract infection EXAM: CT ABDOMEN AND PELVIS WITHOUT CONTRAST TECHNIQUE: Multidetector CT imaging of the abdomen and pelvis was performed following the standard protocol without IV contrast. COMPARISON:  None. FINDINGS: Lower chest: Trace interstitial pulmonary edema within the visualized lung bases. No pleural effusion. Right coronary artery stenting has been performed. Cardiac size  is mildly enlarged. Hypoattenuation of the cardiac blood pool is in keeping with at least mild anemia. Hepatobiliary: Cholelithiasis noted without pericholecystic inflammatory change identified. No intra or extrahepatic biliary ductal dilation. Liver unremarkable. Pancreas: Unremarkable Spleen: Scattered benign calcified granuloma are seen within the spleen in keeping with old granulomatous disease. The spleen is otherwise unremarkable. Adrenals/Urinary Tract: Adrenal glands are unremarkable. The kidneys are normal in size and position.  Linear calcifications within the renal hila likely represent vascular calcifications. No definite calcifications within the renal collecting system. No hydronephrosis. No perinephric fluid collections. The bladder is unremarkable. Stomach/Bowel: Moderate sigmoid diverticulosis. Scattered diverticular noted within the remainder of the colon. Moderate stool throughout the colon without evidence of obstruction. Endoscopic clip noted within the a mid body of the stomach. The stomach, small bowel, and large bowel are otherwise unremarkable. Appendix normal. No free intraperitoneal gas or fluid. Vascular/Lymphatic: Moderate aortoiliac atherosclerotic calcification. No aortic aneurysm. No pathologic adenopathy within the abdomen and pelvis. Reproductive: Status post hysterectomy. No adnexal masses. Other: No abdominal wall hernia.  Rectum unremarkable. Musculoskeletal: Osseous structures are age-appropriate. No acute bone abnormality. No lytic or blastic bone lesions are seen. IMPRESSION: No acute intra-abdominal pathology identified. No definite radiographic explanation for the patient's reported symptoms. Unremarkable noncontrast examination of the kidneys and renal collecting system. No nephro or urolithiasis. Cholelithiasis. Moderate sigmoid diverticulosis. Moderate stool throughout the colon without evidence of obstruction. Aortic Atherosclerosis (ICD10-I70.0). Electronically Signed    By: Fidela Salisbury MD   On: 08/22/2020 05:25   DG Chest 2 View  Result Date: 08/13/2020 CLINICAL DATA:  Shortness of breath EXAM: CHEST - 2 VIEW COMPARISON:  None. FINDINGS: The heart size and mediastinal contours are within normal limits. Patchy airspace opacity seen at the right lung base. Aortic knob calcifications are noted. The visualized skeletal structures are unremarkable. IMPRESSION: Patchy airspace opacity at the right lung base which may be due to atelectasis or infectious etiology Electronically Signed   By: Prudencio Pair M.D.   On: 08/13/2020 21:09   US RENAL  Result Date: 08/14/2020 CLINICAL DATA:  Acute kidney injury, CKD EXAM: RENAL / URINARY TRACT ULTRASOUND COMPLETE COMPARISON:  05/17/2020 CT FINDINGS: Right Kidney: Renal measurements: 13.9 x 5.6 x 5 x 5 cm = volume: 227.9 mL. Mildly increased renal cortical echogenicity. Anechoic simple appearing 1.5 x 1.2 x 1.3 cm renal sinus cyst. No concerning renal mass, shadowing calculus or hydronephrosis. Left Kidney: Renal measurements: 11.7 x 5.9 x 4.8 cm = volume: 171.9 mL. Mildly increased renal cortical echogenicity. No concerning renal mass, shadowing calculus or hydronephrosis. Bladder: Partial decompression of the urinary bladder. No gross abnormality accounting for underdistention. Other: Technically challenging exam given patient body habitus and bowel gas. IMPRESSION: 1. Mildly increased renal cortical echogenicity, can be seen with medical renal disease. 2. 1.5 cm simple appearing right renal sinus cyst corresponding well with comparison CT. Electronically Signed   By: Lovena Le M.D.   On: 08/14/2020 04:31   DG CHEST PORT 1 VIEW  Result Date: 08/24/2020 CLINICAL DATA:  Shortness of breath EXAM: PORTABLE CHEST 1 VIEW COMPARISON:  August 21, 2020 and May 24, 2020 FINDINGS: There is overall interstitial thickening without frank edema or consolidation. Heart is upper normal in size with pulmonary vascularity normal. No adenopathy.  There is aortic atherosclerosis. No bone lesions. IMPRESSION: Interstitial thickening, likely representing a degree of chronic bronchitis. No edema or airspace opacity. Stable cardiac silhouette. Aortic Atherosclerosis (ICD10-I70.0). Electronically Signed   By: Lowella Grip III M.D.   On: 08/24/2020 10:49   DG Chest Portable 1 View  Result Date: 08/21/2020 CLINICAL DATA:  Congestive heart failure EXAM: PORTABLE CHEST 1 VIEW COMPARISON:  08/13/2020 FINDINGS: The heart size and mediastinal contours are within normal limits. Both lungs are clear. The visualized skeletal structures are unremarkable. IMPRESSION: No active disease. Electronically Signed   By: Fidela Salisbury MD   On: 08/21/2020 23:01   DG  Abd Portable 1V  Result Date: 08/15/2020 CLINICAL DATA:  Constipation EXAM: PORTABLE ABDOMEN - 1 VIEW COMPARISON:  07/09/2015 FINDINGS: The right upper quadrant is partially excluded from view. Normal abdominal gas pattern. Moderate stool within the distal colon. No gross free intraperitoneal gas. Endoscopic clip overlies the expected gastric lumen. No abnormal intra-abdominal calcifications identified. Visualized lung bases are clear. No acute bone abnormality. IMPRESSION: Normal abdominal gas pattern. Moderate stool within the distal colon. Electronically Signed   By: Fidela Salisbury MD   On: 08/15/2020 13:47    Microbiology: Recent Results (from the past 240 hour(s))  Resp Panel by RT-PCR (Flu A&B, Covid) Nasopharyngeal Swab     Status: None   Collection Time: 08/21/20 11:06 PM   Specimen: Nasopharyngeal Swab; Nasopharyngeal(NP) swabs in vial transport medium  Result Value Ref Range Status   SARS Coronavirus 2 by RT PCR NEGATIVE NEGATIVE Final    Comment: (NOTE) SARS-CoV-2 target nucleic acids are NOT DETECTED.  The SARS-CoV-2 RNA is generally detectable in upper respiratory specimens during the acute phase of infection. The lowest concentration of SARS-CoV-2 viral copies this assay can  detect is 138 copies/mL. A negative result does not preclude SARS-Cov-2 infection and should not be used as the sole basis for treatment or other patient management decisions. A negative result may occur with  improper specimen collection/handling, submission of specimen other than nasopharyngeal swab, presence of viral mutation(s) within the areas targeted by this assay, and inadequate number of viral copies(<138 copies/mL). A negative result must be combined with clinical observations, patient history, and epidemiological information. The expected result is Negative.  Fact Sheet for Patients:  EntrepreneurPulse.com.au  Fact Sheet for Healthcare Providers:  IncredibleEmployment.be  This test is no t yet approved or cleared by the [place] FDA and  has been authorized for detection and/or diagnosis of SARS-CoV-2 by FDA under an Emergency Use Authorization (EUA). This EUA will remain  in effect (meaning this test can be used) for the duration of the COVID-19 declaration under Section 564(b)(1) of the Act, 21 U.S.C.section 360bbb-3(b)(1), unless the authorization is terminated  or revoked sooner.       Influenza A by PCR NEGATIVE NEGATIVE Final   Influenza B by PCR NEGATIVE NEGATIVE Final    Comment: (NOTE) The Xpert Xpress SARS-CoV-2/FLU/RSV plus assay is intended as an aid in the diagnosis of influenza from Nasopharyngeal swab specimens and should not be used as a sole basis for treatment. Nasal washings and aspirates are unacceptable for Xpert Xpress SARS-CoV-2/FLU/RSV testing.  Fact Sheet for Patients: EntrepreneurPulse.com.au  Fact Sheet for Healthcare Providers: IncredibleEmployment.be  This test is not yet approved or cleared by the [place] FDA and has been authorized for detection and/or diagnosis of SARS-CoV-2 by FDA under an Emergency Use Authorization (EUA). This EUA will remain in  effect (meaning this test can be used) for the duration of the COVID-19 declaration under Section 564(b)(1) of the Act, 21 U.S.C. section 360bbb-3(b)(1), unless the authorization is terminated or revoked.  Performed at [facility], [place] [address] [place], [place] [address]   Urine culture     Status: Abnormal   Collection Time: 08/22/20 12:45 AM   Specimen: Urine, Random  Result Value Ref Range Status   Specimen Description URINE, RANDOM  Final   Special Requests   Final    NONE Performed at [facility], [place] [address], [place], [place] [address]    Culture >=100,000 COLONIES/mL ESCHERICHIA COLI (A)  Final  Report Status 08/24/2020 FINAL  Final   Organism ID, Bacteria ESCHERICHIA COLI (A)  Final      Susceptibility   Escherichia coli - MIC*    AMPICILLIN 8 SENSITIVE Sensitive     CEFAZOLIN <=4 SENSITIVE Sensitive     CEFEPIME <=0.12 SENSITIVE Sensitive     CEFTRIAXONE <=0.25 SENSITIVE Sensitive     CIPROFLOXACIN <=0.25 SENSITIVE Sensitive     GENTAMICIN <=1 SENSITIVE Sensitive     IMIPENEM <=0.25 SENSITIVE Sensitive     NITROFURANTOIN <=16 SENSITIVE Sensitive     TRIMETH/SULFA <=20 SENSITIVE Sensitive     AMPICILLIN/SULBACTAM <=2 SENSITIVE Sensitive     PIP/TAZO <=4 SENSITIVE Sensitive     * >=100,000 COLONIES/mL ESCHERICHIA COLI     Labs: Basic Metabolic Panel: Recent Labs  Lab 08/22/20 0602 08/22/20 1615 08/23/20 0431 08/24/20 0438 08/25/20 0335 08/26/20 0801 08/27/20 0832  NA 132*   < > 137 136 135 135 136  K 5.6*   < > 4.7 4.1 4.2 4.6 4.5  CL 98   < > 103 102 105 102 103  CO2 22   < > 26 26 21* 26 22  GLUCOSE 324*   < > 274* 305* 310* 318* 160*  BUN 80*   < > 73* 69* 54* 45* 52*  CREATININE 2.73*   < > 2.50* 1.95* 1.81* 1.85* 1.81*  CALCIUM 8.5*   < > 8.8* 8.7* 8.8* 9.2 9.2  MG 2.2  --   --   --   --   --   --   PHOS 4.8*  --   --   --   --   --   --    < > = values in this interval not displayed.   Liver Function Tests: Recent Labs   Lab 08/21/20 1948 08/22/20 0602  AST 23 39  ALT 16 9  ALKPHOS 207* 189*  BILITOT 0.5 1.2  PROT 6.3* 5.7*  ALBUMIN 3.2* 2.8*   Recent Labs  Lab 08/21/20 1948  LIPASE 34   No results for input(s): AMMONIA in the last 168 hours. CBC: Recent Labs  Lab 08/22/20 0602 08/23/20 0431 08/24/20 0438 08/25/20 0335 08/26/20 0801  WBC 9.2 7.1 5.9 5.4 5.5  NEUTROABS 6.8  --   --   --   --   HGB 10.9* 10.3* 10.4* 11.3* 12.1  HCT 36.3 33.1* 33.6* 37.6 38.5  MCV 88.1 87.8 85.1 89.7 85.9  PLT 174 161 172 192 235   Cardiac Enzymes: No results for input(s): CKTOTAL, CKMB, CKMBINDEX, TROPONINI in the last 168 hours. BNP: BNP (last 3 results) Recent Labs    07/24/20 1430 08/13/20 1242 08/21/20 2307  BNP 471.7* 814* 215.9*    ProBNP (last 3 results) No results for input(s): PROBNP in the last 8760 hours.  CBG: Recent Labs  Lab 08/26/20 1136 08/26/20 1631 08/26/20 2132 08/27/20 0641 08/27/20 1141  GLUCAP 222* 424* 433* 170* 141*       Signed:  Domenic Polite MD.  [facility] 08/27/2020, 2:11 PM

## 2020-08-28 ENCOUNTER — Telehealth: Payer: Self-pay | Admitting: *Deleted

## 2020-08-28 ENCOUNTER — Other Ambulatory Visit: Payer: Self-pay | Admitting: *Deleted

## 2020-08-28 ENCOUNTER — Other Ambulatory Visit (HOSPITAL_COMMUNITY): Payer: Self-pay

## 2020-08-28 ENCOUNTER — Other Ambulatory Visit (HOSPITAL_COMMUNITY): Payer: Self-pay | Admitting: *Deleted

## 2020-08-28 DIAGNOSIS — N179 Acute kidney failure, unspecified: Secondary | ICD-10-CM

## 2020-08-28 DIAGNOSIS — N184 Chronic kidney disease, stage 4 (severe): Secondary | ICD-10-CM

## 2020-08-28 DIAGNOSIS — E1142 Type 2 diabetes mellitus with diabetic polyneuropathy: Secondary | ICD-10-CM

## 2020-08-28 DIAGNOSIS — E039 Hypothyroidism, unspecified: Secondary | ICD-10-CM

## 2020-08-28 DIAGNOSIS — E1165 Type 2 diabetes mellitus with hyperglycemia: Secondary | ICD-10-CM

## 2020-08-28 DIAGNOSIS — L89302 Pressure ulcer of unspecified buttock, stage 2: Secondary | ICD-10-CM

## 2020-08-28 DIAGNOSIS — N1832 Chronic kidney disease, stage 3b: Secondary | ICD-10-CM

## 2020-08-28 DIAGNOSIS — R000 Tachycardia, unspecified: Secondary | ICD-10-CM

## 2020-08-28 DIAGNOSIS — E66813 Obesity, class 3: Secondary | ICD-10-CM

## 2020-08-28 DIAGNOSIS — Z794 Long term (current) use of insulin: Secondary | ICD-10-CM

## 2020-08-28 DIAGNOSIS — I10 Essential (primary) hypertension: Secondary | ICD-10-CM

## 2020-08-28 DIAGNOSIS — E860 Dehydration: Secondary | ICD-10-CM

## 2020-08-28 DIAGNOSIS — I5032 Chronic diastolic (congestive) heart failure: Secondary | ICD-10-CM

## 2020-08-28 DIAGNOSIS — E1122 Type 2 diabetes mellitus with diabetic chronic kidney disease: Secondary | ICD-10-CM

## 2020-08-28 DIAGNOSIS — B372 Candidiasis of skin and nail: Secondary | ICD-10-CM

## 2020-08-28 MED ORDER — FLUOXETINE HCL 20 MG PO CAPS: 20.0000 mg | ORAL_CAPSULE | Freq: Every day | ORAL | 3 refills | Status: DC

## 2020-08-28 MED ORDER — POTASSIUM CHLORIDE CRYS ER 20 MEQ PO TBCR: 20.0000 meq | EXTENDED_RELEASE_TABLET | Freq: Every day | ORAL | 3 refills | Status: DC

## 2020-08-28 NOTE — Telephone Encounter (Signed)
Received call from from [place], EMT with Heart failure clinic [phone]~ telephone.   Reports that patient was discharged from hospital on 08/27/2020. States that AVS reports patient is supposed to be taking  Fluoxetine 20mg  caps- take 10mg  daily.   Requested clarification. Prescription sent to pharmacy for Fluoxetine 20mg  caps PO QD.

## 2020-08-28 NOTE — Progress Notes (Signed)
Paramedicine Encounter    Patient ID: Susan [name], female    DOB: 06-12-1950, 70 y.o.   MRN: [id number]   Patient Care Team: [name], MD as PCP - General (Family Medicine) Bensimhon, Shaune Pascal, MD as PCP - Cardiology (Cardiology) Jacelyn Pi, MD (Endocrinology) Hollie Salk Nevin Bloodgood, PA-C (Neurology) Newt Minion, MD as Consulting Physician (Orthopedic Surgery)  Patient Active Problem List   Diagnosis Date Noted  . Acute renal failure superimposed on stage 4 chronic kidney disease ([place]) 08/22/2020  . Hypoalbuminemia 05/25/2020  . GERD (gastroesophageal reflux disease) 05/25/2020  . Pressure injury of skin 05/17/2020  . Type 2 diabetes mellitus with diabetic polyneuropathy, with long-term current use of insulin ([place]) 03/07/2020  . Obesity, Class III, BMI 40-49.9 (morbid obesity) ([place]) 03/07/2020  . Common bile duct (CBD) obstruction 05/28/2019  . Benign neoplasm of ascending colon   . Benign neoplasm of transverse colon   . Benign neoplasm of descending colon   . Benign neoplasm of sigmoid colon   . Gastric polyps   . Hyperkalemia 03/11/2019  . Prolonged QT interval 03/11/2019  . Onychomycosis 06/21/2018  . Osteomyelitis of second toe of right foot ([place])   . 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 ([place])   . PVD (peripheral vascular disease) ([place]) 10/26/2017  . E-coli UTI 07/27/2017  . Hypothyroidism 07/27/2017  . PAH (pulmonary artery hypertension) ([place])   . Impaired ambulation 07/19/2017  . Leg cramps 02/27/2017  . Peripheral edema 01/12/2017  . Diabetic neuropathy ([place]) 11/12/2016  . 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 mellitus ([place]) 10/24/2015  . Anemia 10/03/2015  . Generalized anxiety disorder 10/03/2015  . Insomnia 10/03/2015  . Hyperglycemia due to diabetes mellitus ([place]) 06/07/2015  . Chronic diastolic CHF (congestive heart failure) ([place]) 06/07/2015  . Non compliance with medical treatment 04/17/2014  . Rotator cuff tear 03/14/2014   . Class 3 obesity 09/23/2013  . Hypotension 12/25/2012  . Urinary incontinence   . MDD (major depressive disorder) 11/12/2010  . RBBB (right bundle branch block)   . Coronary artery disease involving native coronary artery of native heart without angina pectoris   . Hyperlipemia 01/22/2009  . Essential hypertension 01/22/2009    Current Outpatient Medications:  .  acetaminophen (TYLENOL) 500 MG tablet, Take 1,000 mg by mouth every 6 (six) hours as needed for moderate pain or headache., Disp: , Rfl:  .  albuterol (VENTOLIN HFA) 108 (90 Base) MCG/ACT inhaler, TAKE 2 PUFFS BY MOUTH EVERY 6 HOURS AS NEEDED FOR WHEEZE OR SHORTNESS OF BREATH (Patient taking differently: Inhale 2 puffs into the lungs every 6 (six) hours as needed for wheezing or shortness of breath.), Disp: 8.5 g, Rfl: 1 .  allopurinol (ZYLOPRIM) 100 MG tablet, TAKE 1 TABLET BY MOUTH TWICE A DAY (Patient taking differently: Take 100 mg by mouth 2 (two) times daily.), Disp: 180 tablet, Rfl: 4 .  Ascorbic Acid (VITAMIN C) 1000 MG tablet, Take 1,000 mg by mouth daily., Disp: , Rfl:  .  aspirin EC 81 MG tablet, Take 1 tablet (81 mg total) by mouth daily with breakfast., Disp: 30 tablet, Rfl: 11 .  Bismuth Tribromoph-Petrolatum (XEROFORM PETROLAT GAUZE 5"X9") MISC, Apply 1 each topically 2 (two) times daily. For dressing changes, Disp: 50 each, Rfl: 1 .  carvedilol (COREG) 12.5 MG tablet, TAKE 1 TABLET (12.5 MG TOTAL) BY MOUTH 2 (TWO) TIMES DAILY WITH A MEAL., Disp: 180 tablet, Rfl: 1 .  cholecalciferol (VITAMIN D) 1000 units tablet, Take 1,000  Units by mouth daily with supper. , Disp: , Rfl:  .  clopidogrel (PLAVIX) 75 MG tablet, TAKE 1 TABLET BY MOUTH DAILY WITH BREAKFAST., Disp: 90 tablet, Rfl: 3 .  ferrous sulfate 325 (65 FE) MG tablet, Take 1 tablet (325 mg total) by mouth 2 (two) times daily with a meal. Reported on 08/21/2015, Disp: 60 tablet, Rfl: 2 .  FLUoxetine (PROZAC) 20 MG capsule, Take 1 capsule (20 mg total) by mouth daily.,  Disp: 30 capsule, Rfl: 3 .  guaiFENesin-dextromethorphan (ROBITUSSIN DM) 100-10 MG/5ML syrup, Take 10 mLs by mouth every 4 (four) hours as needed for cough., Disp: 118 mL, Rfl: 0 .  hydrocerin (EUCERIN) CREA, Apply 1 application topically 2 (two) times daily., Disp: 113 g, Rfl: 0 .  Insulin Disposable Pump (OMNIPOD DASH 5 PACK PODS) MISC, Inject into the skin See admin instructions. Use continuously with Novolin R - change every 72 hours., Disp: , Rfl:  .  insulin NPH Human (HUMULIN N,NOVOLIN N) 100 UNIT/ML injection, Inject 15 Units into the skin See admin instructions. Inject 15 units subcutaneously in the morning if CBG >300;, Disp: , Rfl:  .  insulin regular (NOVOLIN R) 100 units/mL injection, Inject into the skin See admin instructions. Manually add bolus to continuous dose via OmniPod 3 times daily per sliding scale (CBG 80-150 7 units, 151-200 9 units, 201-250 12 units, 251-300 14 units, 301- 400 17 units), Disp: , Rfl:  .  levothyroxine (SYNTHROID, LEVOTHROID) 50 MCG tablet, Take 1 tablet (50 mcg total) by mouth daily before breakfast., Disp: 90 tablet, Rfl: 1 .  magnesium oxide (MAG-OX) 400 MG tablet, TAKE 1 TABLET BY MOUTH EVERY DAY (Patient taking differently: Take 400 mg by mouth daily.), Disp: 120 tablet, Rfl: 2 .  miconazole (MICOTIN) 2 % powder, Apply topically as needed for itching., Disp: 70 g, Rfl: 0 .  Multiple Vitamins-Minerals (AIRBORNE GUMMIES PO), Take 2 tablets by mouth every evening., Disp: , Rfl:  .  Multiple Vitamins-Minerals (CENTRUM SILVER 50+WOMEN) TABS, Take 1 tablet by mouth every evening., Disp: , Rfl:  .  Multiple Vitamins-Minerals (ZINC PO), Take 1 tablet by mouth every evening., Disp: , Rfl:  .  ondansetron (ZOFRAN) 4 MG tablet, Take 1 tablet (4 mg total) by mouth every 8 (eight) hours as needed for nausea or vomiting., Disp: 30 tablet, Rfl: 1 .  ONETOUCH VERIO test strip, 3 (three) times daily., Disp: , Rfl:  .  pantoprazole (PROTONIX) 40 MG tablet, Take 40 mg by  mouth daily., Disp: , Rfl:  .  polyethylene glycol (MIRALAX / GLYCOLAX) 17 g packet, Take 17 g by mouth daily., Disp: 14 each, Rfl: 0 .  potassium chloride SA (KLOR-CON) 20 MEQ tablet, Take 20 mEq by mouth daily., Disp: , Rfl:  .  pregabalin (LYRICA) 150 MG capsule, Take 1 capsule (150 mg total) by mouth 2 (two) times daily., Disp: 60 capsule, Rfl: 2 .  torsemide (DEMADEX) 20 MG tablet, Take 4 tablets (80 mg total) by mouth daily., Disp: , Rfl: 0 .  traZODone (DESYREL) 100 MG tablet, TAKE 1 AND 1/2 TABLETS BY MOUTH AT BEDTIME AS NEEDED FOR SLEEP. (Patient taking differently: Take 200 mg by mouth at bedtime.), Disp: 135 tablet, Rfl: 1 .  ursodiol (ACTIGALL) 500 MG tablet, Take 500 mg by mouth 3 (three) times daily., Disp: , Rfl:  .  nitroGLYCERIN (NITROSTAT) 0.4 MG SL tablet, Place 1 tablet (0.4 mg total) under the tongue every 5 (five) minutes as needed for chest pain. (Patient not  taking: Reported on 08/28/2020), Disp: 25 tablet, Rfl: 1 Allergies  Allergen Reactions  . Codeine Nausea And Vomiting      Social History   Socioeconomic History  . Marital status: Married    Spouse name: Not on file  . Number of children: 3  . Years of education: 12th  . Highest education level: Not on file  Occupational History    Employer: UNEMPLOYED  Tobacco Use  . Smoking status: Former Smoker    Packs/day: 3.00    Years: 32.00    Pack years: 96.00    Types: Cigarettes    Quit date: 10/24/1997    Years since quitting: 22.8  . Smokeless tobacco: Never Used  Vaping Use  . Vaping Use: Never used  Substance and Sexual Activity  . Alcohol use: Not Currently    Comment: "might have 2-3 daiquiris in the summer"  . Drug use: No  . Sexual activity: Not Currently  Other Topics Concern  . Not on file  Social History Narrative   Pt lives at home with her spouse.   Caffeine Use- 3 sodas daily   Social Determinants of Health   Financial Resource Strain: Low Risk   . Difficulty of Paying Living  Expenses: Not very hard  Food Insecurity: No Food Insecurity  . Worried About Charity fundraiser in the Last Year: Never true  . Ran Out of Food in the Last Year: Never true  Transportation Needs: No Transportation Needs  . Lack of Transportation (Medical): No  . Lack of Transportation (Non-Medical): No  Physical Activity: Not on file  Stress: Not on file  Social Connections: Not on file  Intimate Partner Violence: Not on file    Physical Exam      Future Appointments  Date Time Provider [place]  09/04/2020  1:30 PM [facility] PA/NP [facility] None    BP (!) 110/50   Pulse 86   Resp 20   SpO2 95%  CBG-193 CBG RECHECK-217  Pt was d/c from hosp yesterday.  I got a phone call from her [employer] nurse reporting that when she called pt this morning to f/u on her d/c that she was having trouble speaking and her CBG was 59 and she wasn't feeling well. Nurse kept her on the phone and got her to eat some food and CBG rose to 69. So they were very concerned and asked for an emergent visit from me today. My schedule allowed me to come directly out today. When I arrived she was in the bathroom.  Pt came up hallway using her roll-ator.  She looks pale and sob and very shaky.  She has not eaten breakfast this morning yet-just some snacks.  She reports eating when she left the hosp at 430 and then a cookie when she got home. She then placed her omnipod on.  Right now her CBG is 193.  -her fluoxetine is listed on rx bottle to take 20mg  daily but then her chart says 10mg  so I called her PCP to clarify but had to leave a message. They did call me back and it was decided for her to continue the 20mg  tablet.  She also reports several times of diarrhea this morning  and feeling nauseated this morning as well. She did take meds prior to eating breakfast but she said she always does and it never bothers her before. While she was admitted she had same thing as well.  Ordered the  pregabalin, potassium, fluoxetine and  ursodiol.  She took an immodium and zofran.  Her potassium instructions was different than the one pharmacy has on file so I will need to get that new rx dose-sent over message to triage nurse for that.   Marylouise Stacks, [place] [facility] Paramedic  08/28/20

## 2020-08-28 NOTE — Telephone Encounter (Signed)
Received call from Yates Decamp with [employer].  Reports that she has a few concerns in regards to patient.   1. States that patient was discharged, but [facility] services were not ordered. Also reports that patient is eligible for 20hrs of custodial services post hospitalization.   Requested order for [name] SN, PT to eval and treat as indicated for post hospitalization, wound care (pressure ulcer to buttocks), and custodial services x20 hours to be sent to [facility].  Orders placed.   2. Reports that discharge summary has K+ as once daily, but pharmacy still has prescription for BID. New prescription sent to pharmacy with dose change.   3. Reports that Fluoxetine is ordered as half dose, but medication is dispensed as capsules. Advised that patient should be taking 20mg  PO QD. Advised that prescription was already sent to pharmacy.   4. Reports that while on the phone with patient, she noted that patient reported low FSBG. States that initial reading was 60. Patient drank juice and reading was noted at 59. Patient then ate small snack and CBG reached 72, then 105. Advised that HTA covers [employer] system.   Advised that patient is followed by Dr [name] with endocrinology and to contact their office for orders regarding DM.

## 2020-08-28 NOTE — Telephone Encounter (Signed)
Transition Care Management Follow-up Telephone Call  Date of discharge and from where: 08/27/2020 - [facility]  How have you been since you were released from the hospital? "Fine"  Any questions or concerns? No  Items Reviewed:  Did the pt receive and understand the discharge instructions provided? Yes   Medications obtained and verified? Yes   Other? No   Any new allergies since your discharge? No   Dietary orders reviewed? No  Do you have support at home? Yes    Functional Questionnaire: (I = Independent and D = Dependent) ADLs: I  Bathing/Dressing- I  Meal Prep- I  Eating- I  Maintaining continence- I  Transferring/Ambulation- I  Managing Meds- I  Follow up appointments reviewed:   PCP Hospital f/u appt confirmed? No    Specialist Hospital f/u appt confirmed? Yes  Scheduled to see [facility] on 09/04/2020 @ 1330.  Are transportation arrangements needed? No   If their condition worsens, is the pt aware to call PCP or go to the Emergency Dept.? Yes  Was the patient provided with contact information for the PCP's office or ED? Yes  Was to pt encouraged to call back with questions or concerns? Yes

## 2020-08-30 ENCOUNTER — Telehealth: Payer: Self-pay | Admitting: *Deleted

## 2020-08-30 NOTE — Telephone Encounter (Signed)
Received call from Annie [facility] PT with [place] [phone]~ telephone.   Requested resumption of care orders for 2x weekly x3 weeks, then 1x weekly x4 weeks.   Call placed to PT to inquire about plan of care. [place].

## 2020-08-30 NOTE — Discharge Summary (Signed)
[facility] Discharge Summary   Patient: Susan [name] WCB:[id number]  PCP: [name], MD  Date of admission: 08/13/2020   Date of discharge: 08/19/2020     Discharge Diagnoses:  Principal diagnosis Acute on chronic diastolic CHF  Active Problems:   Hyperglycemia due to diabetes mellitus ([place])   Hypothyroidism   Pressure injury of skin   Admitted From: home Disposition:  Home with home health  Recommendations for Outpatient Follow-up:  1. PCP: Follow-up with PCP as well as cardiology 2. Follow up LABS/TEST: BMP 3. New Meds: Eucerin, MiraLAX 4. Changed meds: Torsemide dose adjusted from 180 mg/day, 40 mg twice daily with an extra dose of 20 mg twice daily when there is weight gain 5. Stopped meds: Wellbutrin   Follow-up Information    [facility] Follow up.   Why: [phone] The home health agency will contact you for the first home visit.       [name], MD. Schedule an appointment as soon as possible for a visit in 1 week(s).   Specialty: Family Medicine Contact information: [address] [place] [address] [phone]        Bensimhon, Shaune Pascal, MD. Schedule an appointment as soon as possible for a visit in 1 week(s).   Specialty: Cardiology Contact information: [address] [place] [address] [phone]              Discharge Instructions    Diet - low sodium heart healthy   Complete by: As directed    Discharge wound care:   Complete by: As directed    Foam dressing to bilat buttock wounds, change every 3 days or as needed for soiling   Increase activity slowly   Complete by: As directed       Diet recommendation: Cardiac diet  Activity: The patient is advised to gradually reintroduce usual activities, as tolerated  Discharge Condition: stable  Code Status: Full code   History of present illness: As per the H and P dictated on admission, "Susan [name] is a 70 y.o. female  with medical history significant of CAD, chronic diastolic CHF, hypertension, hyperlipidemia, hypothyroidism, insulin-dependent type II diabetes, CKD stage IV, morbid obesity (BMI 44.22), peripheral vascular disease, chronic neuropathy sent to the hospital by her primary care provider because of abnormal labs -hemoglobin 9.0, BNP 814, BUN 104, creatinine 4.1, potassium 5.8.  Patient states she had labs done at her PCPs office and she was told to come into the hospital as they were concerned about problem with her heart and kidneys.  States she is having shortness of breath and swelling in her legs for several weeks for which she was seen by her cardiologist earlier this month and they increased the dose of her home torsemide.  For the last 3 days she is urinating very little and her urine is brown in color.  States she has gained 10 pounds in the past week.  States 3 days ago she felt nauseous and vomited several times.  Nausea and vomiting have now resolved and she is able to tolerate p.o. intake.  No other complaints.  Reports history of Covid infection last year and is now fully vaccinated including booster shot.  ED Course: Blood pressure elevated, remainder of vital signs stable.  Labs in the ED showing WBC 5.8, hemoglobin 9.4, platelet count 167K.  Sodium 131, potassium 5.7, chloride 101, bicarb 19, anion gap 11, BUN 104, creatinine 3.9, glucose 333.  SARS-CoV-2  PCR test pending.  UA with negative nitrite, moderate amount of leukocytes, greater than 50 WBCs, and rare bacteria.  Chest x-ray showing patchy airspace opacity at the right lung base.  EKG without acute changes.  Patient was given [facility] Course:  Summary of her active problems in the hospital is as following.   1.Acute on chronic diastolic CHF Appreciate heart failure services assistance. NICM. Echocardiogram 50 to 60% in the past. Currently continuing diuresis. Volume status improving and renal function remained  stable.  Cardiology currently signed off. Recommend to resume her home regimen which is 180 mg of torsemide per day. Currently only receiving 60 mg of Lasix here in the hospital. Patient tolerated 100 mg of torsemide per day very well. On discharge I will recommend her to initiate 80 mg twice daily torsemide and take an extra 20 mg daily for weight gain.  2.Acute kidney injury on chronic kidney disease stage IV. Baseline serum creatinine around 2.0. Reported episodes of vomiting at home so may have developed some hemodynamic injury. On presentation serum creatinine 4.17.  Cardiorenal hemodynamics. Currently renal function stable. Avoid medication that can affect kidney functions.  3.CAD Continue dual antiplatelet therapy. Monitor.  4.HTN Blood pressure stable. Continue Coreg.  5.Type II DM, uncontrolled with hyperglycemia and hypoglycemia with vascular disease and renal function complication. With long-term history of insulin use Uses insulin pump at home. In the hospital insulin pump was discontinued. Was on on sliding scale insulin along with Levemir. Recommendation to resume insulin pump.   6.  E. Coli UTI. Urine culture positive for E. coli. Pansensitive. Continues to have burning with urination Complete 3-day treatment regimen with Keflex.  Unable to provide Pyridium given renal dysfunction.  7.History of right metatarsal amputation No acute issues.  8.Constipation. Treating with bowel regimen.  9.Itching. Etiology not clear. Symptomatic treatment for now.  10.  Morbid obesity. Placing the patient at high risk of poor outcome. Suspect the patient is also suffering from undiagnosed OSA and may benefit from outpatient sleep study. Body mass index is 40.92 kg/m.   11.  Deconditioning. PT reevaluation continues to suggest home with home health. Patient able to ambulate 60 feet.  Monitor.  Patient was seen by physical therapy, who  recommended Home Health. On the day of the discharge the patient's vitals were stable, and no other acute medical condition were reported by patient. The patient was felt safe to be discharge at Home with Home health.  Consultants: Cardiology  Procedures: none  DISCHARGE MEDICATION: Allergies as of 08/19/2020      Reactions   Codeine Nausea And Vomiting      Medication List    STOP taking these medications   buPROPion 150 MG 24 hr tablet Commonly known as: Wellbutrin XL   torsemide 20 MG tablet Commonly known as: DEMADEX     TAKE these medications   acetaminophen 500 MG tablet Commonly known as: TYLENOL Take 1,000 mg by mouth every 6 (six) hours as needed for moderate pain or headache.   AIRBORNE GUMMIES PO Take 2 tablets by mouth every evening.   Centrum Silver 50+Women Tabs Take 1 tablet by mouth every evening.   albuterol 108 (90 Base) MCG/ACT inhaler Commonly known as: VENTOLIN HFA TAKE 2 PUFFS BY MOUTH EVERY 6 HOURS AS NEEDED FOR WHEEZE OR SHORTNESS OF BREATH What changed: See the new instructions.   allopurinol 100 MG tablet Commonly known as: ZYLOPRIM TAKE 1 TABLET BY MOUTH TWICE A DAY   aspirin EC 81  MG tablet Take 1 tablet (81 mg total) by mouth daily with breakfast.   carvedilol 12.5 MG tablet Commonly known as: COREG TAKE 1 TABLET (12.5 MG TOTAL) BY MOUTH 2 (TWO) TIMES DAILY WITH A MEAL.   cholecalciferol 1000 units tablet Commonly known as: VITAMIN D Take 1,000 Units by mouth daily with supper.   clopidogrel 75 MG tablet Commonly known as: PLAVIX TAKE 1 TABLET BY MOUTH DAILY WITH BREAKFAST.   ferrous sulfate 325 (65 FE) MG tablet Take 1 tablet (325 mg total) by mouth 2 (two) times daily with a meal. Reported on 08/21/2015   guaiFENesin-dextromethorphan 100-10 MG/5ML syrup Commonly known as: ROBITUSSIN DM Take 10 mLs by mouth every 4 (four) hours as needed for cough.   hydrocerin Crea Apply 1 application topically 2 (two) times daily.    insulin NPH Human 100 UNIT/ML injection Commonly known as: NOVOLIN N Inject 15 Units into the skin See admin instructions. Inject 15 units subcutaneously in the morning if CBG >300;   insulin regular 100 units/mL injection Commonly known as: NOVOLIN R Inject into the skin See admin instructions. Manually add bolus to continuous dose via OmniPod 3 times daily per sliding scale (CBG 80-150 7 units, 151-200 9 units, 201-250 12 units, 251-300 14 units, 301- 400 17 units)   levothyroxine 50 MCG tablet Commonly known as: SYNTHROID Take 1 tablet (50 mcg total) by mouth daily before breakfast.   magnesium oxide 400 MG tablet Commonly known as: MAG-OX TAKE 1 TABLET BY MOUTH EVERY DAY   nitroGLYCERIN 0.4 MG SL tablet Commonly known as: NITROSTAT Place 1 tablet (0.4 mg total) under the tongue every 5 (five) minutes as needed for chest pain.   OmniPod Dash 5 Pack Pods Misc Inject into the skin See admin instructions. Use continuously with Novolin R - change every 72 hours.   ondansetron 4 MG tablet Commonly known as: Zofran Take 1 tablet (4 mg total) by mouth every 8 (eight) hours as needed for nausea or vomiting.   OneTouch Verio test strip Generic drug: glucose blood 3 (three) times daily.   polyethylene glycol 17 g packet Commonly known as: MIRALAX / GLYCOLAX Take 17 g by mouth daily.   pregabalin 150 MG capsule Commonly known as: Lyrica Take 1 capsule (150 mg total) by mouth 2 (two) times daily.   traZODone 100 MG tablet Commonly known as: DESYREL TAKE 1 AND 1/2 TABLETS BY MOUTH AT BEDTIME AS NEEDED FOR SLEEP. What changed: See the new instructions.   ursodiol 500 MG tablet Commonly known as: ACTIGALL Take 500 mg by mouth 3 (three) times daily.   vitamin C 1000 MG tablet Take 1,000 mg by mouth daily.   Xeroform Petrolat Gauze 5"x9" Misc Apply 1 each topically 2 (two) times daily. For dressing changes   ZINC PO Take 1 tablet by mouth every evening.             Discharge Care Instructions  (From admission, onward)         Start     Ordered   08/19/20 0000  Discharge wound care:       Comments: Foam dressing to bilat buttock wounds, change every 3 days or as needed for soiling   08/19/20 1055          Discharge Exam: Filed Weights   08/16/20 0543 08/17/20 0615 08/19/20 0500  Weight: 116.9 kg 114.5 kg 115 kg   Vitals:   08/19/20 0429 08/19/20 1000  BP: (!) 139/59 (!) 152/69  Pulse:  67 69  Resp: 16 18  Temp: 98.3 F (36.8 C) 98 F (36.7 C)  SpO2: 91% 94%   General: Appear in mild distress, no Rash; Oral Mucosa Clear, moist. no Abnormal Neck Mass Or lumps, Conjunctiva normal  Cardiovascular: S1 and S2 Present, no Murmur, Respiratory: good respiratory effort, Bilateral Air entry present and CTA, no Crackles, no wheezes Abdomen: Bowel Sound present, Soft and no tenderness Extremities: bilateral trace Pedal edema Neurology: alert and oriented to time, place, and person affect appropriate. no new focal deficit Gait not checked due to patient safety concerns    The results of significant diagnostics from this hospitalization (including imaging, microbiology, ancillary and laboratory) are listed below for reference.    Significant Diagnostic Studies:  DG Chest 2 View  Result Date: 08/13/2020 CLINICAL DATA:  Shortness of breath EXAM: CHEST - 2 VIEW COMPARISON:  None. FINDINGS: The heart size and mediastinal contours are within normal limits. Patchy airspace opacity seen at the right lung base. Aortic knob calcifications are noted. The visualized skeletal structures are unremarkable. IMPRESSION: Patchy airspace opacity at the right lung base which may be due to atelectasis or infectious etiology Electronically Signed   By: Prudencio Pair M.D.   On: 08/13/2020 21:09   US RENAL  Result Date: 08/14/2020 CLINICAL DATA:  Acute kidney injury, CKD EXAM: RENAL / URINARY TRACT ULTRASOUND COMPLETE COMPARISON:  05/17/2020 CT FINDINGS: Right Kidney:  Renal measurements: 13.9 x 5.6 x 5 x 5 cm = volume: 227.9 mL. Mildly increased renal cortical echogenicity. Anechoic simple appearing 1.5 x 1.2 x 1.3 cm renal sinus cyst. No concerning renal mass, shadowing calculus or hydronephrosis. Left Kidney: Renal measurements: 11.7 x 5.9 x 4.8 cm = volume: 171.9 mL. Mildly increased renal cortical echogenicity. No concerning renal mass, shadowing calculus or hydronephrosis. Bladder: Partial decompression of the urinary bladder. No gross abnormality accounting for underdistention. Other: Technically challenging exam given patient body habitus and bowel gas. IMPRESSION: 1. Mildly increased renal cortical echogenicity, can be seen with medical renal disease. 2. 1.5 cm simple appearing right renal sinus cyst corresponding well with comparison CT. Electronically Signed   By: Lovena Le M.D.   On: 08/14/2020 04:31   DG Abd Portable 1V  Result Date: 08/15/2020 CLINICAL DATA:  Constipation EXAM: PORTABLE ABDOMEN - 1 VIEW COMPARISON:  07/09/2015 FINDINGS: The right upper quadrant is partially excluded from view. Normal abdominal gas pattern. Moderate stool within the distal colon. No gross free intraperitoneal gas. Endoscopic clip overlies the expected gastric lumen. No abnormal intra-abdominal calcifications identified. Visualized lung bases are clear. No acute bone abnormality. IMPRESSION: Normal abdominal gas pattern. Moderate stool within the distal colon. Electronically Signed   By: Fidela Salisbury MD   On: 08/15/2020 13:47   Time spent: 35 minutes  Signed:  Berle Mull  [facility] 08/19/2020 6:54 PM

## 2020-08-31 ENCOUNTER — Telehealth: Payer: Self-pay | Admitting: *Deleted

## 2020-08-31 NOTE — Telephone Encounter (Signed)
Good, take extra torsemide if it gets above 255.

## 2020-08-31 NOTE — Telephone Encounter (Signed)
Received call from patient.   Reports daily weight has been noted as follows:  6-Apr 250.6lbs  7-Apr 251.8lbs  8-Apr 252.7lbs   PCP to be made aware.

## 2020-08-31 NOTE — Telephone Encounter (Signed)
Call placed to patient and patient made aware.  

## 2020-09-03 ENCOUNTER — Telehealth: Payer: Self-pay

## 2020-09-03 NOTE — Telephone Encounter (Signed)
Stay on the same dose for now.

## 2020-09-04 ENCOUNTER — Ambulatory Visit (HOSPITAL_COMMUNITY)
Admission: RE | Admit: 2020-09-04 | Discharge: 2020-09-04 | Disposition: A | Discharge status: Home / Self Care | Payer: HMO | Source: Ambulatory Visit | Attending: Cardiology | Admitting: Cardiology

## 2020-09-04 ENCOUNTER — Other Ambulatory Visit: Payer: Self-pay

## 2020-09-04 ENCOUNTER — Telehealth: Payer: Self-pay | Admitting: *Deleted

## 2020-09-04 ENCOUNTER — Encounter (HOSPITAL_COMMUNITY): Payer: Self-pay

## 2020-09-04 VITALS — BP 136/70 | HR 93 | Wt 257.0 lb

## 2020-09-04 DIAGNOSIS — I5032 Chronic diastolic (congestive) heart failure: Secondary | ICD-10-CM | POA: Diagnosis not present

## 2020-09-04 DIAGNOSIS — Z7982 Long term (current) use of aspirin: Secondary | ICD-10-CM | POA: Diagnosis not present

## 2020-09-04 DIAGNOSIS — R300 Dysuria: Secondary | ICD-10-CM

## 2020-09-04 DIAGNOSIS — Z8673 Personal history of transient ischemic attack (TIA), and cerebral infarction without residual deficits: Secondary | ICD-10-CM | POA: Insufficient documentation

## 2020-09-04 DIAGNOSIS — Z79890 Hormone replacement therapy (postmenopausal): Secondary | ICD-10-CM | POA: Insufficient documentation

## 2020-09-04 DIAGNOSIS — I252 Old myocardial infarction: Secondary | ICD-10-CM | POA: Insufficient documentation

## 2020-09-04 DIAGNOSIS — Z87891 Personal history of nicotine dependence: Secondary | ICD-10-CM | POA: Diagnosis not present

## 2020-09-04 DIAGNOSIS — E039 Hypothyroidism, unspecified: Secondary | ICD-10-CM | POA: Diagnosis not present

## 2020-09-04 DIAGNOSIS — E1122 Type 2 diabetes mellitus with diabetic chronic kidney disease: Secondary | ICD-10-CM | POA: Diagnosis not present

## 2020-09-04 DIAGNOSIS — Z89421 Acquired absence of other right toe(s): Secondary | ICD-10-CM | POA: Insufficient documentation

## 2020-09-04 DIAGNOSIS — Z885 Allergy status to narcotic agent status: Secondary | ICD-10-CM | POA: Insufficient documentation

## 2020-09-04 DIAGNOSIS — Z9641 Presence of insulin pump (external) (internal): Secondary | ICD-10-CM | POA: Diagnosis not present

## 2020-09-04 DIAGNOSIS — I2510 Atherosclerotic heart disease of native coronary artery without angina pectoris: Secondary | ICD-10-CM | POA: Diagnosis not present

## 2020-09-04 DIAGNOSIS — R400 Somnolence: Secondary | ICD-10-CM | POA: Diagnosis not present

## 2020-09-04 DIAGNOSIS — Z6841 Body Mass Index (BMI) 40.0 and over, adult: Secondary | ICD-10-CM | POA: Diagnosis not present

## 2020-09-04 DIAGNOSIS — Z79899 Other long term (current) drug therapy: Secondary | ICD-10-CM | POA: Diagnosis not present

## 2020-09-04 DIAGNOSIS — I428 Other cardiomyopathies: Secondary | ICD-10-CM | POA: Diagnosis not present

## 2020-09-04 DIAGNOSIS — Z8249 Family history of ischemic heart disease and other diseases of the circulatory system: Secondary | ICD-10-CM | POA: Insufficient documentation

## 2020-09-04 DIAGNOSIS — Z955 Presence of coronary angioplasty implant and graft: Secondary | ICD-10-CM | POA: Diagnosis not present

## 2020-09-04 DIAGNOSIS — Z87440 Personal history of urinary (tract) infections: Secondary | ICD-10-CM | POA: Insufficient documentation

## 2020-09-04 DIAGNOSIS — I130 Hypertensive heart and chronic kidney disease with heart failure and stage 1 through stage 4 chronic kidney disease, or unspecified chronic kidney disease: Secondary | ICD-10-CM | POA: Diagnosis not present

## 2020-09-04 DIAGNOSIS — Z7902 Long term (current) use of antithrombotics/antiplatelets: Secondary | ICD-10-CM | POA: Insufficient documentation

## 2020-09-04 DIAGNOSIS — N184 Chronic kidney disease, stage 4 (severe): Secondary | ICD-10-CM | POA: Insufficient documentation

## 2020-09-04 DIAGNOSIS — E1165 Type 2 diabetes mellitus with hyperglycemia: Secondary | ICD-10-CM | POA: Insufficient documentation

## 2020-09-04 DIAGNOSIS — Z794 Long term (current) use of insulin: Secondary | ICD-10-CM | POA: Diagnosis not present

## 2020-09-04 LAB — URINALYSIS, ROUTINE W REFLEX MICROSCOPIC
Bilirubin Urine: NEGATIVE
Glucose, UA: 150 mg/dL — AB
Hgb urine dipstick: NEGATIVE
Ketones, ur: NEGATIVE mg/dL
Nitrite: NEGATIVE
Protein, ur: 100 mg/dL — AB
Specific Gravity, Urine: 1.012 (ref 1.005–1.030)
WBC, UA: >50 WBC/hpf — ABNORMAL HIGH (ref 0–5)
pH: 5 (ref 5.0–8.0)

## 2020-09-04 LAB — BASIC METABOLIC PANEL
Anion gap: 8 (ref 5–15)
BUN: 59 mg/dL — ABNORMAL HIGH (ref 8–23)
CO2: 26 mmol/L (ref 22–32)
Calcium: 9 mg/dL (ref 8.9–10.3)
Chloride: 102 mmol/L (ref 98–111)
Creatinine, Ser: 2.47 mg/dL — ABNORMAL HIGH (ref 0.44–1.00)
GFR, Estimated: 20 mL/min — ABNORMAL LOW (ref >60)
Glucose, Bld: 273 mg/dL — ABNORMAL HIGH (ref 70–99)
Potassium: 5.2 mmol/L — ABNORMAL HIGH (ref 3.5–5.1)
Sodium: 136 mmol/L (ref 135–145)

## 2020-09-04 MED ORDER — TORSEMIDE 20 MG PO TABS: ORAL_TABLET | ORAL | 3 refills | Status: DC

## 2020-09-04 NOTE — Telephone Encounter (Signed)
Received call from [place] with [place] [phone]~ telephone.   Reports that resumption of care visit has been completed. Requested VO for wound care to pressure ulcers on buttocks. States that L buttocks contains stage 2 ulcer, and R buttocks contains stage 1 ulcer. Reports that above gluteal cleft, there are multiple open areas resembling scratch marks.   Requested OV to treat areas with Exiderm. VO given.

## 2020-09-04 NOTE — Progress Notes (Signed)
Advanced Heart Failure Clinic Note   Primary Physician [name], MD HF Cardiologist: Glori Bickers, MD  Nephrology: Dr Hollie Salk  Reason for Visit:  Chronic Diastolic Heart Failure  HPI:  Susan [name] is a 70 y.o. female with a history of RBBB, DM, Hypothyroidish, diastolic heart failure, CAD MI 2000/2007 BMS 2007, TIA, obesity, CKD Stage IV and  Rt transmetatarsal amputation.   Admitted June 1st through June 3rd, 2017 with altered mental status, hypothermia, and weakness. Diuretics initially held and later restarted.   Admitted 2/27-07/28/17 for AKI. Torsemide was held and restarted at 60 mg BID. She had RHC 07/24/17 that showed mild PAH with evidence of RV strain likely due to OHS/OSA. She was discharged to SNF.   Admitted 10/9483 with A/C diastolic HF. Diuresed with IV lasix.   Admitted 10/24-10/27/19 with syncope. She was orthostatic. Had AKI with creatinine up to 3.33 and required IVF. Renal US showed no hydronephrosis. Losartan and Imdur were DC'd. Creatinine 1.76 on day of DC.   Was admitted 1/21 for confusion and abdominal pain. Found to have Klebsiella pneumoniae UTI treated w/ abx. Also with elevated LFTs. CT of abdomen showed cholelithiasis. MRCP showed cholelithiasis without evidence of cholecystitis or choledocholithiasis. Abdominal US with doppler showed no acute finding.No concern for cholangitis. All viral serologies negative. Her Crestor was discontinued. She had outpatient GI f/u and continued work-up including a liver biopsy 08/11/19. No definite pathologic fibrosis noted in pathology report.   Admitted to [facility] 05/17/20 with N/V/D in the setting of possible gastroparesis.    Admitted to [facility] 05/24/20 with and A/C diastolic heart failure. Diuresed with IV lasix and transitioned to torsemide 80 mg twice a day.   She returned for HF follow up 1/22. Stressed out about taking care of her disabled son. SOB with exertion. + Orthopnea. Eating fast food. Not  weighing at home. Taking all medications. Lives with her husband and disabled son. She was instructed to increase torsemide to 100 mg bid x 2 days, then back down to 80 mg bid.  Had return f/u again on 2/11 and was still taking 100 of torsemide bid and had not reduced back down to 80 bid as previously directed. Labs showed AKI, w/ SCr rising from baseline of 1.9>>2.4. Was advised to reduce torsemide to 80 mg once daily x 3 days, then return to 80 mg bid.   Had home sleep study 07/25/20 and this was negative for OSA.   08/14/20, she was admitted to [facility] with AKI and progressive volume overloaded in setting of recurrent UTI and probable ATN. SCr rose to 4.1. She was placed on IV abx and IV lasix w/ improved renal function and volume status. SCr was down to 1.78 day of d/c. PT had recommended SNF at d/c however pt declined. She was discharged home on 3/27 but readmitted 2 days later for urinary retention and recurrent AKI. SCr had increased back up to 3.1. UA showed persistent UTI. She was placed back on IV abx. CT without hydronephrosis. SCr improved w/ tx of UTI. SCr back down to 1.7 on day of d/c and she was started back on PO torsemide but discharged home on 80 mg once daily (previous dose was 80 bid). She was discharged home on 4/4.    She presents back to clinic today for f/u. Has been followed by paramedicine. Overall feeling better but wt starting to trend back up w/ reduced dose of torsemide. D/c wt was 250 lb. Wt up  7 lb at 257, NYHA Class III. She also notes some mild recurrent dysuria and reports urine appeared "cloudy" this morning. Denies fever and chills.       Cardiac Studies: Echo (9/21): EF 55-60%, RV ok Echo (10/19): EF 55-60%, Grade I DD  RHC (07/24/17): showed mild PAH with evidence of RV strain likely due to OHS/OSA.  Findings: RA = 17 RV = 48/15 PA = 49/11 (30) PCW = 18 Fick cardiac output/index = 7.0/3.0 PVR = 0.5 WU Ao sat = 97% PA sat = 62%, 64% Assessment: 1. Normal  left-sided pressures 2. Mild PAH with evidence of RV strain likely due to OHS/OSA 3. Normal cardiac output  Lexiscan w/ stress echo (2018): EF 68%, low risk  LHC (2007): BMS distal RCA Cardiolite (03/2002): EF of 51% but no ischemia LHC (2000): BMS RCA   ROS: All systems reviewed and negative except as per HPI.   Current Meds  Medication Sig  . acetaminophen (TYLENOL) 500 MG tablet Take 1,000 mg by mouth every 6 (six) hours as needed for moderate pain or headache.  . albuterol (VENTOLIN HFA) 108 (90 Base) MCG/ACT inhaler TAKE 2 PUFFS BY MOUTH EVERY 6 HOURS AS NEEDED FOR WHEEZE OR SHORTNESS OF BREATH (Patient taking differently: Inhale 2 puffs into the lungs every 6 (six) hours as needed for wheezing or shortness of breath.)  . allopurinol (ZYLOPRIM) 100 MG tablet TAKE 1 TABLET BY MOUTH TWICE A DAY (Patient taking differently: Take 100 mg by mouth 2 (two) times daily.)  . Ascorbic Acid (VITAMIN C) 1000 MG tablet Take 1,000 mg by mouth daily.  Marland Kitchen aspirin EC 81 MG tablet Take 1 tablet (81 mg total) by mouth daily with breakfast.  . Bismuth Tribromoph-Petrolatum (XEROFORM PETROLAT GAUZE 5"X9") MISC Apply 1 each topically 2 (two) times daily. For dressing changes  . carvedilol (COREG) 12.5 MG tablet TAKE 1 TABLET (12.5 MG TOTAL) BY MOUTH 2 (TWO) TIMES DAILY WITH A MEAL.  . cholecalciferol (VITAMIN D) 1000 units tablet Take 1,000 Units by mouth daily with supper.   . clopidogrel (PLAVIX) 75 MG tablet TAKE 1 TABLET BY MOUTH DAILY WITH BREAKFAST.  . ferrous sulfate 325 (65 FE) MG tablet Take 1 tablet (325 mg total) by mouth 2 (two) times daily with a meal. Reported on 08/21/2015  . FLUoxetine (PROZAC) 20 MG capsule Take 1 capsule (20 mg total) by mouth daily.  Marland Kitchen guaiFENesin-dextromethorphan (ROBITUSSIN DM) 100-10 MG/5ML syrup Take 10 mLs by mouth every 4 (four) hours as needed for cough.  . hydrocerin (EUCERIN) CREA Apply 1 application topically 2 (two) times daily.  . Insulin Disposable Pump  (OMNIPOD DASH 5 PACK PODS) MISC Inject into the skin See admin instructions. Use continuously with Novolin R - change every 72 hours.  . insulin NPH Human (HUMULIN N,NOVOLIN N) 100 UNIT/ML injection Inject 15 Units into the skin See admin instructions. Inject 15 units subcutaneously in the morning if CBG >300;  . insulin regular (NOVOLIN R) 100 units/mL injection Inject into the skin See admin instructions. Manually add bolus to continuous dose via OmniPod 3 times daily per sliding scale (CBG 80-150 7 units, 151-200 9 units, 201-250 12 units, 251-300 14 units, 301- 400 17 units)  . levothyroxine (SYNTHROID, LEVOTHROID) 50 MCG tablet Take 1 tablet (50 mcg total) by mouth daily before breakfast.  . magnesium oxide (MAG-OX) 400 MG tablet TAKE 1 TABLET BY MOUTH EVERY DAY (Patient taking differently: Take 400 mg by mouth daily.)  . miconazole (MICOTIN) 2 % powder  Apply topically as needed for itching.  . Multiple Vitamins-Minerals (AIRBORNE GUMMIES PO) Take 2 tablets by mouth every evening.  . Multiple Vitamins-Minerals (CENTRUM SILVER 50+WOMEN) TABS Take 1 tablet by mouth every evening.  . Multiple Vitamins-Minerals (ZINC PO) Take 1 tablet by mouth every evening.  . nitroGLYCERIN (NITROSTAT) 0.4 MG SL tablet Place 1 tablet (0.4 mg total) under the tongue every 5 (five) minutes as needed for chest pain.  Marland Kitchen ondansetron (ZOFRAN) 4 MG tablet Take 1 tablet (4 mg total) by mouth every 8 (eight) hours as needed for nausea or vomiting.  Glory Rosebush VERIO test strip 3 (three) times daily.  . pantoprazole (PROTONIX) 40 MG tablet Take 40 mg by mouth daily.  . polyethylene glycol (MIRALAX / GLYCOLAX) 17 g packet Take 17 g by mouth daily.  . potassium chloride SA (KLOR-CON) 20 MEQ tablet Take 1 tablet (20 mEq total) by mouth daily.  . pregabalin (LYRICA) 150 MG capsule Take 1 capsule (150 mg total) by mouth 2 (two) times daily.  Marland Kitchen torsemide (DEMADEX) 20 MG tablet Take 4 tablets (80 mg total) by mouth daily.  .  traZODone (DESYREL) 100 MG tablet TAKE 1 AND 1/2 TABLETS BY MOUTH AT BEDTIME AS NEEDED FOR SLEEP. (Patient taking differently: Take 200 mg by mouth at bedtime.)  . ursodiol (ACTIGALL) 500 MG tablet Take 500 mg by mouth 3 (three) times daily.   Allergies  Allergen Reactions  . Codeine Nausea And Vomiting   Past Medical History:  Diagnosis Date  . Acute MI ([place]) 1999; 2007  . Anemia    hx  . Anginal pain ([place])   . Anxiety   . ARF (acute renal failure) ([place]) 06/2017    Kidney Asso  . Arthritis    "generalized" (03/15/2014)  . CAD (coronary artery disease)    MI in 2000 - MI  2007 - treated bare metal stent (no nuclear since then as 9/11)  . Carotid artery disease ([place])   . CHF (congestive heart failure) ([place])   . Chronic diastolic heart failure (HCC)    a) ECHO (08/2013) EF 55-60% and RV function nl b) RHC (08/2013) RA 4, RV 30/5/7, PA 25/10 (16), PCWP 7, Fick CO/CI 6.3/2.7, PVR 1.5 WU, PA 61 and 66%  . Daily headache    "~ every other day; since I fell in June" (03/15/2014)  . Depression   . Dyslipidemia   . Dyspnea   . Exertional shortness of breath   . HTN (hypertension)   . Hypothyroidism   . Neuropathy   . Obesity   . Osteoarthritis   . Peripheral neuropathy   . PONV (postoperative nausea and vomiting)   . RBBB (right bundle branch block)    Old  . Stroke [facility])    mini strokes  . Syncope    likely due to low blood sugar  . Tachycardia    Sinus tachycardia  . Type II diabetes mellitus (HCC)    Type II  . Urinary incontinence   . Venous insufficiency    Family History  Problem Relation Age of Onset  . Heart attack Mother 41   Past Surgical History:  Procedure Laterality Date  . ABDOMINAL HYSTERECTOMY  1980's  . AMPUTATION Right 02/24/2018   Procedure: RIGHT FOOT GREAT TOE AND 2ND TOE AMPUTATION;  Surgeon: Newt Minion, MD;  Location: [place];  Service: Orthopedics;  Laterality: Right;  . AMPUTATION Right 04/30/2018   Procedure: RIGHT TRANSMETATARSAL  AMPUTATION;  Surgeon: Newt Minion, MD;  Location: [place];  Service: Orthopedics;  Laterality: Right;  . BIOPSY  05/27/2020   Procedure: BIOPSY;  Surgeon: [name], DO;  [facility];  Service: Endoscopy;;  . CATARACT EXTRACTION, BILATERAL Bilateral ?2013  . COLONOSCOPY W/ POLYPECTOMY    . COLONOSCOPY WITH PROPOFOL N/A 03/13/2019   Procedure: COLONOSCOPY WITH PROPOFOL;  Surgeon: Jerene Bears, MD;  Location: [place];  Service: Gastroenterology;  Laterality: N/A;  . [place]; 2007   "1 + 1"  . ESOPHAGOGASTRODUODENOSCOPY (EGD) WITH PROPOFOL N/A 03/13/2019   Procedure: ESOPHAGOGASTRODUODENOSCOPY (EGD) WITH PROPOFOL;  Surgeon: Jerene Bears, MD;  Location: [facility] ENDOSCOPY;  Service: Gastroenterology;  Laterality: N/A;  . ESOPHAGOGASTRODUODENOSCOPY (EGD) WITH PROPOFOL N/A 05/27/2020   Procedure: ESOPHAGOGASTRODUODENOSCOPY (EGD) WITH PROPOFOL;  Surgeon: [name], DO;  [facility];  Service: Endoscopy;  Laterality: N/A;  . EYE SURGERY Bilateral    [place]azer  . HEMOSTASIS CLIP PLACEMENT  03/13/2019   Procedure: HEMOSTASIS CLIP PLACEMENT;  Surgeon: Jerene Bears, MD;  Location: [facility] ENDOSCOPY;  Service: Gastroenterology;;  . KNEE ARTHROSCOPY Left 10/25/2006  . POLYPECTOMY  03/13/2019   Procedure: POLYPECTOMY;  Surgeon: Jerene Bears, MD;  Location: [place];  Service: Gastroenterology;;  . RIGHT HEART CATH N/A 07/24/2017   Procedure: RIGHT HEART CATH;  Surgeon: Jolaine Artist, MD;  Location: [place] CV LAB;  Service: Cardiovascular;  Laterality: N/A;  . RIGHT HEART CATHETERIZATION N/A 09/22/2013   Procedure: RIGHT HEART CATH;  Surgeon: Jolaine Artist, MD;  Location: [facility] CATH LAB;  Service: Cardiovascular;  Laterality: N/A;  . SHOULDER ARTHROSCOPY WITH OPEN ROTATOR CUFF REPAIR Right 03/14/2014   Procedure: RIGHT SHOULDER ARTHROSCOPY WITH BICEPS RELEASE, OPEN SUBSCAPULA REPAIR, OPEN SUPRASPINATUS REPAIR.;  Surgeon:  Meredith Pel, MD;  Location: [place];  Service: Orthopedics;  Laterality: Right;  . TOE AMPUTATION Right 02/24/2018   GREAT TOE AND 2ND TOE AMPUTATION  . TUBAL LIGATION  1970's   Social History   Socioeconomic History  . Marital status: Married    Spouse name: Not on file  . Number of children: 3  . Years of education: 12th  . Highest education level: Not on file  Occupational History    Employer: UNEMPLOYED  Tobacco Use  . Smoking status: Former Smoker    Packs/day: 3.00    Years: 32.00    Pack years: 96.00    Types: Cigarettes    Quit date: 10/24/1997    Years since quitting: 22.8  . Smokeless tobacco: Never Used  Vaping Use  . Vaping Use: Never used  Substance and Sexual Activity  . Alcohol use: Not Currently    Comment: "might have 2-3 daiquiris in the summer"  . Drug use: No  . Sexual activity: Not Currently  Other Topics Concern  . Not on file  Social History Narrative   Pt lives at home with her spouse.   Caffeine Use- 3 sodas daily   Social Determinants of Health   Financial Resource Strain: Low Risk   . Difficulty of Paying Living Expenses: Not very hard  Food Insecurity: No Food Insecurity  . Worried About Charity fundraiser in the Last Year: Never true  . Ran Out of Food in the Last Year: Never true  Transportation Needs: No Transportation Needs  . Lack of Transportation (Medical): No  . Lack of Transportation (Non-Medical): No  Physical Activity: Not on file  Stress: Not on file  Social Connections: Not  on file  Intimate Partner Violence: Not on file   Lipid Panel     Component Value Date/Time   CHOL 242 (H) 12/21/2019 1550   TRIG 125 03/07/2020 1808   HDL 52 12/21/2019 1550   CHOLHDL 4.7 12/21/2019 1550   VLDL 60 (H) 12/21/2019 1550   LDLCALC 130 (H) 12/21/2019 1550   LDLCALC 65 01/17/2019 1608    Blood pressure 136/70, pulse 93, weight 116.6 kg (257 lb), SpO2 96 %.  Wt Readings from Last 3 Encounters:  09/04/20 116.6 kg (257 lb)   08/22/20 116 kg (255 lb 11.7 oz)  08/20/20 115.2 kg (254 lb)    Today's Vitals   09/04/20 1341  BP: 136/70  Pulse: 93  SpO2: 96%  Weight: 116.6 kg (257 lb)   Body mass index is 41.48 kg/m.  PHYSICAL EXAM: General:  Well appearing, moderately obese. No respiratory difficulty HEENT: normal Neck: supple. JVD 8 cm. Carotids 2+ bilat; no bruits. No lymphadenopathy or thyromegaly appreciated. Cor: PMI nondisplaced. Regular rate & rhythm. No rubs, gallops or murmurs. Lungs: clear Abdomen: soft, nontender, nondistended. No hepatosplenomegaly. No bruits or masses. Good bowel sounds. Extremities: no cyanosis, clubbing, rash, trace bilateral LE edema Neuro: alert & oriented x 3, cranial nerves grossly intact. moves all 4 extremities w/o difficulty. Affect pleasant.    ASSESSMENT AND PLAN:   1. Chronic Diastolic Heart Failure:  - NICM, Echo 06/2017: EF 55-60%, grade 1 DD - Echo 03/19/18: EF 55-60%, grade 1 DD - Echo 01/2020 EF 55-60%. RV normal  - NYHA III, chronic confounded by obesity/ deconditioning.  Volume status mildly elevated after recent diuretic decrease. Wt up 7 lb.  - Increase torsemide to 80 mg qhs/ 40 mg qpm. Check BMP today and again in 7 days - Continue carvedilol 12.5 mg bid. - c/w para medicine.     2.  Uncontrolled DM:  - Improving, recent Hgb A1c was down to 8.7 (previously 11)  - Follows closely with Dr. Bubba Camp. Has an insulin pump.  3. CAD: history of CAD with BMS in 2007.  Low risk nuc study in 2018  - Denies CP  - Continue DAPT w/ ASA + Plavix.  - Continue  blocker therapy. - Off statin due to previously elevated LFTs.   4. Morbid Obesity: - Body mass index is 41.48 kg/m.   5. HTN:  - controlled on current regimen  - check BMP today   6. CKD Stage IV - Followed by Dr. Hollie Salk.  - Baseline creatinine ~1.5-1.9 - recent admits for AKI in setting of UTI (finished abx). SCr peaked to 4.1, down to 1.7 day of d/c - repeat BMP today and again in 7 days  w/ torsemide increase  7. Daytime fatigue - Completed sleep study. Dr Radford Pax intepreted study. No evidence of OSA   8.  S/p right transmetatarsal amputation - Followed by Dr Sharol Given.    9. Recurrent UTIs - UTI 3/22 + 4/22 treated w/ ceftriaxone - she reports recurrent dysuria. Repeat UA + UC - discussed hygiene  - if recurrent UTI may need chronic abx suppressive therapy, advised to f/u w/ PCP   F/u in 4 weeks Continue HF Paramedicine.    Lyda Jester PA-C  09/04/2020 2:02 PM

## 2020-09-04 NOTE — Patient Instructions (Addendum)
Increase Toresemide to 80 mg in the AM and 40 mg in the PM  Labs today We will only contact you if something comes back abnormal or we need to make some changes. Otherwise no news is good news!  Your physician recommends that you schedule a follow-up appointment in: 4 weeks  in the Advanced Practitioners (PA/NP) Clinic        Urinary Tract Infection, Adult  A urinary tract infection (UTI) is an infection of any part of the urinary tract. The urinary tract includes:  The kidneys.  The ureters.  The bladder.  The urethra. These organs make, store, and get rid of pee (urine) in the body. What are the causes? This infection is caused by germs (bacteria) in your genital area. These germs grow and cause swelling (inflammation) of your urinary tract. What increases the risk? The following factors may make you more likely to develop this condition:  Using a small, thin tube (catheter) to drain pee.  Not being able to control when you pee or poop (incontinence).  Being female. If you are female, these things can increase the risk: ? Using these methods to prevent pregnancy:  A medicine that kills sperm (spermicide).  A device that blocks sperm (diaphragm). ? Having low levels of a female hormone (estrogen). ? Being pregnant. You are more likely to develop this condition if:  You have genes that add to your risk.  You are sexually active.  You take antibiotic medicines.  You have trouble peeing because of: ? A prostate that is bigger than normal, if you are female. ? A blockage in the part of your body that drains pee from the bladder. ? A kidney stone. ? A nerve condition that affects your bladder. ? Not getting enough to drink. ? Not peeing often enough.  You have other conditions, such as: ? Diabetes. ? A weak disease-fighting system (immune system). ? Sickle cell disease. ? Gout. ? Injury of the spine. What are the signs or symptoms? Symptoms of this condition  include:  Needing to pee right away.  Peeing small amounts often.  Pain or burning when peeing.  Blood in the pee.  Pee that smells bad or not like normal.  Trouble peeing.  Pee that is cloudy.  Fluid coming from the vagina, if you are female.  Pain in the belly or lower back. Other symptoms include:  Vomiting.  Not feeling hungry.  Feeling mixed up (confused). This may be the first symptom in older adults.  Being tired and grouchy (irritable).  A fever.  Watery poop (diarrhea). How is this treated?  Taking antibiotic medicine.  Taking other medicines.  Drinking enough water. In some cases, you may need to see a specialist. Follow these instructions at home: Medicines  Take over-the-counter and prescription medicines only as told by your doctor.  If you were prescribed an antibiotic medicine, take it as told by your doctor. Do not stop taking it even if you start to feel better. General instructions  Make sure you: ? Pee until your bladder is empty. ? Do not hold pee for a long time. ? Empty your bladder after sex. ? Wipe from front to back after peeing or pooping if you are a female. Use each tissue one time when you wipe.  Drink enough fluid to keep your pee pale yellow.  Keep all follow-up visits.    Contact a doctor if:  You do not get better after 1-2 days.  Your symptoms go  away and then come back. Get help right away if:  You have very bad back pain.  You have very bad pain in your lower belly.  You have a fever.  You have chills.  You feeling like you will vomit or you vomit. Summary  A urinary tract infection (UTI) is an infection of any part of the urinary tract.  This condition is caused by germs in your genital area.  There are many risk factors for a UTI.  Treatment includes antibiotic medicines.  Drink enough fluid to keep your pee pale yellow. This information is not intended to replace advice given to you by your  health care provider. Make sure you discuss any questions you have with your health care provider. Document Revised: 12/23/2019 Document Reviewed: 12/23/2019 Elsevier Patient Education  [place].

## 2020-09-04 NOTE — Telephone Encounter (Signed)
Pt is aware to stay on the same dose of medication. She will call tomorrow with weight update

## 2020-09-05 ENCOUNTER — Telehealth: Payer: Self-pay | Admitting: *Deleted

## 2020-09-05 NOTE — Telephone Encounter (Signed)
Received call from patient.   Reports daily weight has been noted as follows:  11-Apr 254.5lbs  12-Apr 253.6lbs  13-Apr 252.9lbs   Of note, patient was seen at CHF clinic and Torsemide 20mg  was increased to 4 tabs PO Q AM and 2 tabs PO Q PM.   PCP to be made aware.

## 2020-09-06 ENCOUNTER — Telehealth (HOSPITAL_COMMUNITY): Payer: Self-pay

## 2020-09-06 ENCOUNTER — Other Ambulatory Visit (HOSPITAL_COMMUNITY): Payer: Self-pay

## 2020-09-06 DIAGNOSIS — I5032 Chronic diastolic (congestive) heart failure: Secondary | ICD-10-CM

## 2020-09-06 MED ORDER — CEPHALEXIN 250 MG PO CAPS: 250.0000 mg | ORAL_CAPSULE | Freq: Two times a day (BID) | ORAL | 0 refills | Status: DC

## 2020-09-06 MED ORDER — TORSEMIDE 20 MG PO TABS: 80.0000 mg | ORAL_TABLET | Freq: Every morning | ORAL | 3 refills | Status: DC

## 2020-09-06 NOTE — Telephone Encounter (Signed)
Weight is stable, continue 80 am and 40 pm.

## 2020-09-06 NOTE — Telephone Encounter (Signed)
Call placed to patient and patient made aware.  

## 2020-09-06 NOTE — Telephone Encounter (Signed)
-----   Message from Consuelo Pandy, [place] sent at 09/06/2020  4:41 PM EDT ----- Appears to have UTI again. Start Keflex 250 mg bid x 10 days and make sure she follows up w/ her PCP ASAP Also w/ AKI and mildly elevated K  Stop KCL. Hold Lasix tomorrow, then resume back on Saturday at reduced dose of 80 mg once daily for now. F/u BMP in 5 days.

## 2020-09-06 NOTE — Telephone Encounter (Signed)
Patient advised and verbalized understanding.lab appt scheduled,lab order entered. Med list updated to reflect changes.Rx sent into patients pharmacy.  Meds ordered this encounter  Medications  . cephALEXin (KEFLEX) 250 MG capsule    Sig: Take 1 capsule (250 mg total) by mouth 2 (two) times daily for 10 days.    Dispense:  20 capsule    Refill:  0  . torsemide (DEMADEX) 20 MG tablet    Sig: Take 4 tablets (80 mg total) by mouth in the morning.    Dispense:  180 tablet    Refill:  3    Please cancel all previous orders for current medication. Change in dosage or pill size.   Orders Placed This Encounter  Procedures  . Basic metabolic panel    Standing Status:   Future    Standing Expiration Date:   09/06/2021    Order Specific Question:   Release to patient    Answer:   Immediate

## 2020-09-06 NOTE — Progress Notes (Signed)
Paramedicine Encounter    Patient ID: Susan [name], female    DOB: 28-Sep-1950, 70 y.o.   MRN: [id number]   Patient Care Team: [name], MD as PCP - General (Family Medicine) Bensimhon, Shaune Pascal, MD as PCP - Cardiology (Cardiology) Jacelyn Pi, MD (Endocrinology) Hollie Salk Nevin Bloodgood, PA-C (Neurology) Newt Minion, MD as Consulting Physician (Orthopedic Surgery) Jorge Ny, LCSW as Social Worker (Licensed Clinical Social Worker)  Patient Active Problem List   Diagnosis Date Noted  . Acute renal failure superimposed on stage 4 chronic kidney disease ([place]) 08/22/2020  . Hypoalbuminemia 05/25/2020  . GERD (gastroesophageal reflux disease) 05/25/2020  . Pressure injury of skin 05/17/2020  . Type 2 diabetes mellitus with diabetic polyneuropathy, with long-term current use of insulin ([place]) 03/07/2020  . Obesity, Class III, BMI 40-49.9 (morbid obesity) ([place]) 03/07/2020  . Common bile duct (CBD) obstruction 05/28/2019  . Benign neoplasm of ascending colon   . Benign neoplasm of transverse colon   . Benign neoplasm of descending colon   . Benign neoplasm of sigmoid colon   . Gastric polyps   . Hyperkalemia 03/11/2019  . Prolonged QT interval 03/11/2019  . Onychomycosis 06/21/2018  . Osteomyelitis of second toe of right foot ([name])   . 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 ([place])   . PVD (peripheral vascular disease) ([place]) 10/26/2017  . E-coli UTI 07/27/2017  . Hypothyroidism 07/27/2017  . PAH (pulmonary artery hypertension) ([place])   . Impaired ambulation 07/19/2017  . Leg cramps 02/27/2017  . Peripheral edema 01/12/2017  . Diabetic neuropathy ([place]) 11/12/2016  . 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 mellitus ([place]) 10/24/2015  . Anemia 10/03/2015  . Generalized anxiety disorder 10/03/2015  . Insomnia 10/03/2015  . Hyperglycemia due to diabetes mellitus ([place]) 06/07/2015  . Chronic diastolic CHF (congestive heart failure) ([place]) 06/07/2015  . Non  compliance with medical treatment 04/17/2014  . Rotator cuff tear 03/14/2014  . Class 3 obesity 09/23/2013  . Hypotension 12/25/2012  . Urinary incontinence   . MDD (major depressive disorder) 11/12/2010  . RBBB (right bundle branch block)   . Coronary artery disease involving native coronary artery of native heart without angina pectoris   . Hyperlipemia 01/22/2009  . Essential hypertension 01/22/2009    Current Outpatient Medications:  .  acetaminophen (TYLENOL) 500 MG tablet, Take 1,000 mg by mouth every 6 (six) hours as needed for moderate pain or headache., Disp: , Rfl:  .  albuterol (VENTOLIN HFA) 108 (90 Base) MCG/ACT inhaler, TAKE 2 PUFFS BY MOUTH EVERY 6 HOURS AS NEEDED FOR WHEEZE OR SHORTNESS OF BREATH (Patient taking differently: Inhale 2 puffs into the lungs every 6 (six) hours as needed for wheezing or shortness of breath.), Disp: 8.5 g, Rfl: 1 .  allopurinol (ZYLOPRIM) 100 MG tablet, TAKE 1 TABLET BY MOUTH TWICE A DAY (Patient taking differently: Take 100 mg by mouth 2 (two) times daily.), Disp: 180 tablet, Rfl: 4 .  Ascorbic Acid (VITAMIN C) 1000 MG tablet, Take 1,000 mg by mouth daily., Disp: , Rfl:  .  aspirin EC 81 MG tablet, Take 1 tablet (81 mg total) by mouth daily with breakfast., Disp: 30 tablet, Rfl: 11 .  Bismuth Tribromoph-Petrolatum (XEROFORM PETROLAT GAUZE 5"X9") MISC, Apply 1 each topically 2 (two) times daily. For dressing changes, Disp: 50 each, Rfl: 1 .  carvedilol (COREG) 12.5 MG tablet, TAKE 1 TABLET (12.5 MG TOTAL) BY MOUTH 2 (TWO) TIMES DAILY WITH A MEAL., Disp: 180 tablet, Rfl:  1 .  cholecalciferol (VITAMIN D) 1000 units tablet, Take 1,000 Units by mouth daily with supper. , Disp: , Rfl:  .  clopidogrel (PLAVIX) 75 MG tablet, TAKE 1 TABLET BY MOUTH DAILY WITH BREAKFAST., Disp: 90 tablet, Rfl: 3 .  ferrous sulfate 325 (65 FE) MG tablet, Take 1 tablet (325 mg total) by mouth 2 (two) times daily with a meal. Reported on 08/21/2015, Disp: 60 tablet, Rfl: 2 .   FLUoxetine (PROZAC) 20 MG capsule, Take 1 capsule (20 mg total) by mouth daily., Disp: 30 capsule, Rfl: 3 .  hydrocerin (EUCERIN) CREA, Apply 1 application topically 2 (two) times daily., Disp: 113 g, Rfl: 0 .  Insulin Disposable Pump (OMNIPOD DASH 5 PACK PODS) MISC, Inject into the skin See admin instructions. Use continuously with Novolin R - change every 72 hours., Disp: , Rfl:  .  insulin NPH Human (HUMULIN N,NOVOLIN N) 100 UNIT/ML injection, Inject 15 Units into the skin See admin instructions. Inject 15 units subcutaneously in the morning if CBG >300;, Disp: , Rfl:  .  insulin regular (NOVOLIN R) 100 units/mL injection, Inject into the skin See admin instructions. Manually add bolus to continuous dose via OmniPod 3 times daily per sliding scale (CBG 80-150 7 units, 151-200 9 units, 201-250 12 units, 251-300 14 units, 301- 400 17 units), Disp: , Rfl:  .  levothyroxine (SYNTHROID, LEVOTHROID) 50 MCG tablet, Take 1 tablet (50 mcg total) by mouth daily before breakfast., Disp: 90 tablet, Rfl: 1 .  magnesium oxide (MAG-OX) 400 MG tablet, TAKE 1 TABLET BY MOUTH EVERY DAY (Patient taking differently: Take 400 mg by mouth daily.), Disp: 120 tablet, Rfl: 2 .  miconazole (MICOTIN) 2 % powder, Apply topically as needed for itching., Disp: 70 g, Rfl: 0 .  Multiple Vitamins-Minerals (AIRBORNE GUMMIES PO), Take 2 tablets by mouth every evening., Disp: , Rfl:  .  Multiple Vitamins-Minerals (CENTRUM SILVER 50+WOMEN) TABS, Take 1 tablet by mouth every evening., Disp: , Rfl:  .  Multiple Vitamins-Minerals (ZINC PO), Take 1 tablet by mouth every evening., Disp: , Rfl:  .  ONETOUCH VERIO test strip, 3 (three) times daily., Disp: , Rfl:  .  pantoprazole (PROTONIX) 40 MG tablet, Take 40 mg by mouth daily., Disp: , Rfl:  .  polyethylene glycol (MIRALAX / GLYCOLAX) 17 g packet, Take 17 g by mouth daily., Disp: 14 each, Rfl: 0 .  potassium chloride SA (KLOR-CON) 20 MEQ tablet, Take 1 tablet (20 mEq total) by mouth  daily., Disp: 30 tablet, Rfl: 3 .  pregabalin (LYRICA) 150 MG capsule, Take 1 capsule (150 mg total) by mouth 2 (two) times daily., Disp: 60 capsule, Rfl: 2 .  torsemide (DEMADEX) 20 MG tablet, Take 4 tablets (80 mg total) by mouth in the morning AND 2 tablets (40 mg total) every evening., Disp: 180 tablet, Rfl: 3 .  traZODone (DESYREL) 100 MG tablet, TAKE 1 AND 1/2 TABLETS BY MOUTH AT BEDTIME AS NEEDED FOR SLEEP. (Patient taking differently: Take 200 mg by mouth at bedtime.), Disp: 135 tablet, Rfl: 1 .  ursodiol (ACTIGALL) 500 MG tablet, Take 500 mg by mouth 3 (three) times daily., Disp: , Rfl:  .  guaiFENesin-dextromethorphan (ROBITUSSIN DM) 100-10 MG/5ML syrup, Take 10 mLs by mouth every 4 (four) hours as needed for cough. (Patient not taking: Reported on 09/06/2020), Disp: 118 mL, Rfl: 0 .  nitroGLYCERIN (NITROSTAT) 0.4 MG SL tablet, Place 1 tablet (0.4 mg total) under the tongue every 5 (five) minutes as needed for chest pain. (  Patient not taking: Reported on 09/06/2020), Disp: 25 tablet, Rfl: 1 .  ondansetron (ZOFRAN) 4 MG tablet, Take 1 tablet (4 mg total) by mouth every 8 (eight) hours as needed for nausea or vomiting. (Patient not taking: Reported on 09/06/2020), Disp: 30 tablet, Rfl: 1 Allergies  Allergen Reactions  . Codeine Nausea And Vomiting      Social History   Socioeconomic History  . Marital status: Married    Spouse name: Not on file  . Number of children: 3  . Years of education: 12th  . Highest education level: Not on file  Occupational History    Employer: UNEMPLOYED  Tobacco Use  . Smoking status: Former Smoker    Packs/day: 3.00    Years: 32.00    Pack years: 96.00    Types: Cigarettes    Quit date: 10/24/1997    Years since quitting: 22.8  . Smokeless tobacco: Never Used  Vaping Use  . Vaping Use: Never used  Substance and Sexual Activity  . Alcohol use: Not Currently    Comment: "might have 2-3 daiquiris in the summer"  . Drug use: No  . Sexual activity:  Not Currently  Other Topics Concern  . Not on file  Social History Narrative   Pt lives at home with her spouse.   Caffeine Use- 3 sodas daily   Social Determinants of Health   Financial Resource Strain: Low Risk   . Difficulty of Paying Living Expenses: Not very hard  Food Insecurity: No Food Insecurity  . Worried About Charity fundraiser in the Last Year: Never true  . Ran Out of Food in the Last Year: Never true  Transportation Needs: No Transportation Needs  . Lack of Transportation (Medical): No  . Lack of Transportation (Non-Medical): No  Physical Activity: Not on file  Stress: Not on file  Social Connections: Not on file  Intimate Partner Violence: Not on file    Physical Exam      Future Appointments  Date Time Provider [place]  09/10/2020 12:15 PM [name], MD [id number] None  10/11/2020  1:30 PM [facility] PA/NP [facility] None    BP 110/64   Pulse 75   Resp 20   Wt 252 lb (114.3 kg)   SpO2 95%   BMI 40.67 kg/m  CBG PTA-185 Weight yesterday-252  Last visit weight-257  Pt reports today she is feeling much better, yesterday her CBG was over 500.  She did call her doctor and they advised her what to do and how much insulin to take. They did tell her to keep check on that omnipod b/c it could have been malfunctioning.  She looks and feels much better today.  She reports her breathing is doing good unless she moves around too much.  She went to clinic the other day and her torsemide was increased to 80/40.  Checked her pill box and it was correct and topper her off for the week.    Susan [name], [place] [facility] Paramedic  09/06/20

## 2020-09-07 LAB — URINE CULTURE: Culture: >=100000 — AB

## 2020-09-10 ENCOUNTER — Ambulatory Visit (INDEPENDENT_AMBULATORY_CARE_PROVIDER_SITE_OTHER): Payer: HMO | Admitting: Family Medicine

## 2020-09-10 ENCOUNTER — Other Ambulatory Visit: Payer: Self-pay

## 2020-09-10 ENCOUNTER — Encounter: Payer: Self-pay | Admitting: Family Medicine

## 2020-09-10 VITALS — BP 142/78 | HR 86 | Temp 98.6°F | Resp 18 | Ht 66.0 in | Wt 255.8 lb

## 2020-09-10 DIAGNOSIS — R300 Dysuria: Secondary | ICD-10-CM | POA: Diagnosis not present

## 2020-09-10 DIAGNOSIS — E1165 Type 2 diabetes mellitus with hyperglycemia: Secondary | ICD-10-CM

## 2020-09-10 DIAGNOSIS — N179 Acute kidney failure, unspecified: Secondary | ICD-10-CM

## 2020-09-10 DIAGNOSIS — N390 Urinary tract infection, site not specified: Secondary | ICD-10-CM

## 2020-09-10 LAB — URINALYSIS, ROUTINE W REFLEX MICROSCOPIC
Bilirubin Urine: NEGATIVE
Hyaline Cast: NONE SEEN /LPF
Ketones, ur: NEGATIVE
Nitrite: NEGATIVE
Specific Gravity, Urine: 1.015 (ref 1.001–1.03)
pH: 5.5 (ref 5.0–8.0)

## 2020-09-10 LAB — BASIC METABOLIC PANEL WITH GFR
BUN/Creatinine Ratio: 32 (calc) — ABNORMAL HIGH (ref 6–22)
BUN: 69 mg/dL — ABNORMAL HIGH (ref 7–25)
CO2: 26 mmol/L (ref 20–32)
Calcium: 9.2 mg/dL (ref 8.6–10.4)
Chloride: 99 mmol/L (ref 98–110)
Creat: 2.14 mg/dL — ABNORMAL HIGH (ref 0.60–0.93)
GFR, Est African American: 26 mL/min/{1.73_m2} — ABNORMAL LOW (ref >=60)
GFR, Est Non African American: 23 mL/min/{1.73_m2} — ABNORMAL LOW (ref >=60)
Glucose, Bld: 319 mg/dL — ABNORMAL HIGH (ref 65–99)
Potassium: 4.4 mmol/L (ref 3.5–5.3)
Sodium: 136 mmol/L (ref 135–146)

## 2020-09-10 LAB — MICROSCOPIC MESSAGE

## 2020-09-10 MED ORDER — SULFAMETHOXAZOLE-TRIMETHOPRIM 400-80 MG PO TABS
1.0000 | ORAL_TABLET | Freq: Every day | ORAL | 3 refills | Status: DC
Start: 2020-09-10 — End: 2020-10-23

## 2020-09-10 MED ORDER — SULFAMETHOXAZOLE-TRIMETHOPRIM 400-80 MG PO TABS: 1.0000 | ORAL_TABLET | Freq: Two times a day (BID) | ORAL | 0 refills | Status: DC

## 2020-09-10 NOTE — Progress Notes (Signed)
Subjective:    Patient ID: Susan [name], female    DOB: [birth year], 70 y.o.   MRN: [id number]  HPI 08/20/20 Patient is a 70 year old Caucasian female who was previously seen my partner but is here today establishing care with me.  My partner has since retired.  Patient has a history of insulin-dependent diabetes mellitus.  She currently sees an endocrinologist.  She is on an insulin pump.  However her most recent A1c was 11.0.  Patient reports widely variable blood sugars.  For instance this morning it was around 70.  Prior to coming to my office it was over 300.  She is performing sliding scale insulin correction in addition to her maintenance dose.  She also has congestive heart failure and is currently taking torsemide 80 mg twice a day.  She states that she is weighing herself every day and giving herself additional diuretic if her weight goes up more than 3 pounds.  However she was in this office on February 28 as well as on March 22.  In the interval time, she had gained over 20 pounds and had not notified us.  Therefore I question if she is weighing herself every day and sticking to her regimen.  She presents today reporting some mild shortness of breath.  She was discharged from the hospital yesterday.  Her creatinine at discharge was 1.8 down from over 4 on admission.  She has diuresed over 20 pounds over the last few days.  She does have still +1 edema in both legs with chronic venous stasis changes to the mid shin bilaterally.  There are 2 small blisters on the right anterior shin that are not weeping and have scabbed over.  At that time, my plan was: For the present time, I want the patient to stay on torsemide 80 mg twice daily.  I want to check a BMP today to monitor her potassium as well as her renal function.  I have asked the patient to call my office every Monday, Wednesday, and Friday with a weight update so that we can track her weight more closely.  That is the only way I can see to  prevent recurrent admission due to fluid overload because I question if the patient is altering the dose of her diuretic based on her weight.  She states that her weight according to her scales was 259 pounds this morning.  Therefore I would anticipate to try to keep her weight around 259 pounds by her report.  We will need to adjust her torsemide to maintain that and watch her renal function closely.  Of also recommended that she schedule a follow-up appointment with her endocrinologist.  Her A1c was just over 8 in December and had increased to 11 in March.  Therefore I question if her pump is functioning normally or if she has been compliant with the sliding scale.  I want her to schedule a follow-up with her endocrinologist to discuss her management.  These 2 issues I see are the most dangerous for the patient.  09/10/20 Patient was readmitted to the hospital March 29 through April 4 for acute renal failure.  Despite taking her diuretics, the patient became oliguric.  As result we recommended she go to the emergency room where she was found to have a urinary tract infection on top of her chronic kidney disease.  Urinary tract infection showed E. coli and she was treated with Rocephin.  Creatinine on admission was 3.7.  After treatment with antibiotics  it had dropped to her baseline of 1.8 at discharge on April 4.  She was seen by her cardiologist shortly thereafter.  She was complaining of a urinary tract infection and a performed a urinalysis and urine culture.  This showed Enterococcus faecalis.  They started her on Keflex for 7 days.  Her creatinine had risen to 2.5 and her potassium was up to 5.2.  However culture and sensitivity showed that the pathogen was resistant to cephalexin.  She presents today urinalysis shows trace blood, trace leukocyte esterase, 2+ protein along with 2+ glucose.  She states that she gets frequent urinary tract infections.  Given the fact that the most recent was resistant to  Keflex I suspect that this is still the Enterococcus faecalis that she was experiencing at her cardiologist office.  She also reports highly variable blood sugars.  Her sugar this morning was 270.  However she seen sugars as high as 500 and usually in the 300s.   Past Medical History:  Diagnosis Date  . Acute MI ([place]) 1999; 2007  . Anemia    hx  . Anginal pain ([place])   . Anxiety   . ARF (acute renal failure) ([place]) 06/2017   [facility]  . Arthritis    "generalized" (03/15/2014)  . CAD (coronary artery disease)    MI in 2000 - MI  2007 - treated bare metal stent (no nuclear since then as 9/11)  . Carotid artery disease ([place])   . CHF (congestive heart failure) ([place])   . Chronic diastolic heart failure (HCC)    a) ECHO (08/2013) EF 55-60% and RV function nl b) RHC (08/2013) RA 4, RV 30/5/7, PA 25/10 (16), PCWP 7, Fick CO/CI 6.3/2.7, PVR 1.5 WU, PA 61 and 66%  . Daily headache    "~ every other day; since I fell in June" (03/15/2014)  . Depression   . Dyslipidemia   . Dyspnea   . Exertional shortness of breath   . HTN (hypertension)   . Hypothyroidism   . Neuropathy   . Obesity   . Osteoarthritis   . Peripheral neuropathy   . PONV (postoperative nausea and vomiting)   . RBBB (right bundle branch block)    Old  . Stroke [facility])    mini strokes  . Syncope    likely due to low blood sugar  . Tachycardia    Sinus tachycardia  . Type II diabetes mellitus (HCC)    Type II  . Urinary incontinence   . Venous insufficiency    Past Surgical History:  Procedure Laterality Date  . ABDOMINAL HYSTERECTOMY  1980's  . AMPUTATION Right 02/24/2018   Procedure: RIGHT FOOT GREAT TOE AND 2ND TOE AMPUTATION;  Surgeon: Newt Minion, MD;  Location: [place];  Service: Orthopedics;  Laterality: Right;  . AMPUTATION Right 04/30/2018   Procedure: RIGHT TRANSMETATARSAL AMPUTATION;  Surgeon: Newt Minion, MD;  Location: [place];  Service: Orthopedics;  Laterality: Right;  . BIOPSY  05/27/2020    Procedure: BIOPSY;  Surgeon: [name], DO;  [facility];  Service: Endoscopy;;  . CATARACT EXTRACTION, BILATERAL Bilateral ?2013  . COLONOSCOPY W/ POLYPECTOMY    . COLONOSCOPY WITH PROPOFOL N/A 03/13/2019   Procedure: COLONOSCOPY WITH PROPOFOL;  Surgeon: Jerene Bears, MD;  Location: [place];  Service: Gastroenterology;  Laterality: N/A;  . [place]; 2007   "1 + 1"  . ESOPHAGOGASTRODUODENOSCOPY (EGD) WITH PROPOFOL N/A 03/13/2019  Procedure: ESOPHAGOGASTRODUODENOSCOPY (EGD) WITH PROPOFOL;  Surgeon: Jerene Bears, MD;  Location: [facility] ENDOSCOPY;  Service: Gastroenterology;  Laterality: N/A;  . ESOPHAGOGASTRODUODENOSCOPY (EGD) WITH PROPOFOL N/A 05/27/2020   Procedure: ESOPHAGOGASTRODUODENOSCOPY (EGD) WITH PROPOFOL;  Surgeon: [name], DO;  [facility];  Service: Endoscopy;  Laterality: N/A;  . EYE SURGERY Bilateral    [place]  . HEMOSTASIS CLIP PLACEMENT  03/13/2019   Procedure: HEMOSTASIS CLIP PLACEMENT;  Surgeon: Jerene Bears, MD;  Location: [facility] ENDOSCOPY;  Service: Gastroenterology;;  . KNEE ARTHROSCOPY Left 10/25/2006  . POLYPECTOMY  03/13/2019   Procedure: POLYPECTOMY;  Surgeon: Jerene Bears, MD;  Location: [place];  Service: Gastroenterology;;  . RIGHT HEART CATH N/A 07/24/2017   Procedure: RIGHT HEART CATH;  Surgeon: Jolaine Artist, MD;  Location: [place] CV LAB;  Service: Cardiovascular;  Laterality: N/A;  . RIGHT HEART CATHETERIZATION N/A 09/22/2013   Procedure: RIGHT HEART CATH;  Surgeon: Jolaine Artist, MD;  Location: [facility] C[facility] LAB;  Service: Cardiovascular;  Laterality: N/A;  . SHOULDER ARTHROSCOPY WITH OPEN ROTATOR CUFF REPAIR Right 03/14/2014   Procedure: RIGHT SHOULDER ARTHROSCOPY WITH BICEPS RELEASE, OPEN SUBSCAPULA REPAIR, OPEN SUPRASPINATUS REPAIR.;  Surgeon: Meredith Pel, MD;  Location: [place];  Service: Orthopedics;  Laterality: Right;  . TOE AMPUTATION Right 02/24/2018   GREAT  TOE AND 2ND TOE AMPUTATION  . TUBAL LIGATION  1970's   Current Outpatient Medications on File Prior to Visit  Medication Sig Dispense Refill  . acetaminophen (TYLENOL) 500 MG tablet Take 1,000 mg by mouth every 6 (six) hours as needed for moderate pain or headache.    . albuterol (VENTOLIN HFA) 108 (90 Base) MCG/ACT inhaler TAKE 2 PUFFS BY MOUTH EVERY 6 HOURS AS NEEDED FOR WHEEZE OR SHORTNESS OF BREATH (Patient taking differently: Inhale 2 puffs into the lungs every 6 (six) hours as needed for wheezing or shortness of breath.) 8.5 g 1  . allopurinol (ZYLOPRIM) 100 MG tablet TAKE 1 TABLET BY MOUTH TWICE A DAY (Patient taking differently: Take 100 mg by mouth 2 (two) times daily.) 180 tablet 4  . Ascorbic Acid (VITAMIN C) 1000 MG tablet Take 1,000 mg by mouth daily.    Marland Kitchen aspirin EC 81 MG tablet Take 1 tablet (81 mg total) by mouth daily with breakfast. 30 tablet 11  . Bismuth Tribromoph-Petrolatum (XEROFORM PETROLAT GAUZE 5"X9") MISC Apply 1 each topically 2 (two) times daily. For dressing changes 50 each 1  . carvedilol (COREG) 12.5 MG tablet TAKE 1 TABLET (12.5 MG TOTAL) BY MOUTH 2 (TWO) TIMES DAILY WITH A MEAL. 180 tablet 1  . cholecalciferol (VITAMIN D) 1000 units tablet Take 1,000 Units by mouth daily with supper.     . clopidogrel (PLAVIX) 75 MG tablet TAKE 1 TABLET BY MOUTH DAILY WITH BREAKFAST. 90 tablet 3  . ferrous sulfate 325 (65 FE) MG tablet Take 1 tablet (325 mg total) by mouth 2 (two) times daily with a meal. Reported on 08/21/2015 60 tablet 2  . FLUoxetine (PROZAC) 20 MG capsule Take 1 capsule (20 mg total) by mouth daily. 30 capsule 3  . guaiFENesin-dextromethorphan (ROBITUSSIN DM) 100-10 MG/5ML syrup Take 10 mLs by mouth every 4 (four) hours as needed for cough. 118 mL 0  . hydrocerin (EUCERIN) CREA Apply 1 application topically 2 (two) times daily. 113 g 0  . Insulin Disposable Pump (OMNIPOD DASH 5 PACK PODS) MISC Inject into the skin See admin instructions. Use continuously with  Novolin R - change every 72  hours.    . insulin NPH Human (HUMULIN N,NOVOLIN N) 100 UNIT/ML injection Inject 15 Units into the skin See admin instructions. Inject 15 units subcutaneously in the morning if CBG >300;    . insulin regular (NOVOLIN R) 100 units/mL injection Inject into the skin See admin instructions. Manually add bolus to continuous dose via OmniPod 3 times daily per sliding scale (CBG 80-150 7 units, 151-200 9 units, 201-250 12 units, 251-300 14 units, 301- 400 17 units)    . levothyroxine (SYNTHROID, LEVOTHROID) 50 MCG tablet Take 1 tablet (50 mcg total) by mouth daily before breakfast. 90 tablet 1  . magnesium oxide (MAG-OX) 400 MG tablet TAKE 1 TABLET BY MOUTH EVERY DAY (Patient taking differently: Take 400 mg by mouth daily.) 120 tablet 2  . miconazole (MICOTIN) 2 % powder Apply topically as needed for itching. 70 g 0  . Multiple Vitamins-Minerals (AIRBORNE GUMMIES PO) Take 2 tablets by mouth every evening.    . Multiple Vitamins-Minerals (CENTRUM SILVER 50+WOMEN) TABS Take 1 tablet by mouth every evening.    . Multiple Vitamins-Minerals (ZINC PO) Take 1 tablet by mouth every evening.    . nitroGLYCERIN (NITROSTAT) 0.4 MG SL tablet Place 1 tablet (0.4 mg total) under the tongue every 5 (five) minutes as needed for chest pain. 25 tablet 1  . ONETOUCH VERIO test strip 3 (three) times daily.    . pantoprazole (PROTONIX) 40 MG tablet Take 40 mg by mouth daily.    . polyethylene glycol (MIRALAX / GLYCOLAX) 17 g packet Take 17 g by mouth daily. 14 each 0  . pregabalin (LYRICA) 150 MG capsule Take 1 capsule (150 mg total) by mouth 2 (two) times daily. 60 capsule 2  . torsemide (DEMADEX) 20 MG tablet Take 4 tablets (80 mg total) by mouth in the morning. (Patient taking differently: Take by mouth. 80mg  PO Q AM, 40mg  PO Q PM) 180 tablet 3  . traZODone (DESYREL) 100 MG tablet TAKE 1 AND 1/2 TABLETS BY MOUTH AT BEDTIME AS NEEDED FOR SLEEP. (Patient taking differently: Take 200 mg by mouth at  bedtime.) 135 tablet 1  . ursodiol (ACTIGALL) 500 MG tablet Take 500 mg by mouth 3 (three) times daily.     No current facility-administered medications on file prior to visit.   Allergies  Allergen Reactions  . Codeine Nausea And Vomiting   Social History   Socioeconomic History  . Marital status: Married    Spouse name: Not on file  . Number of children: 3  . Years of education: 12th  . Highest education level: Not on file  Occupational History    Employer: UNEMPLOYED  Tobacco Use  . Smoking status: Former Smoker    Packs/day: 3.00    Years: 32.00    Pack years: 96.00    Types: Cigarettes    Quit date: 10/24/1997    Years since quitting: 22.8  . Smokeless tobacco: Never Used  Vaping Use  . Vaping Use: Never used  Substance and Sexual Activity  . Alcohol use: Not Currently    Comment: "might have 2-3 daiquiris in the summer"  . Drug use: No  . Sexual activity: Not Currently  Other Topics Concern  . Not on file  Social History Narrative   Pt lives at home with her spouse.   Caffeine Use- 3 sodas daily   Social Determinants of Health   Financial Resource Strain: Low Risk   . Difficulty of Paying Living Expenses: Not very hard  Food Insecurity:  No Food Insecurity  . Worried About Charity fundraiser in the Last Year: Never true  . Ran Out of Food in the Last Year: Never true  Transportation Needs: No Transportation Needs  . Lack of Transportation (Medical): No  . Lack of Transportation (Non-Medical): No  Physical Activity: Not on file  Stress: Not on file  Social Connections: Not on file  Intimate Partner Violence: Not on file     Review of Systems  All other systems reviewed and are negative.      Objective:   Physical Exam Vitals reviewed.  Constitutional:      General: She is not in acute distress.    Appearance: She is obese. She is not ill-appearing, toxic-appearing or diaphoretic.  Cardiovascular:     Rate and Rhythm: Normal rate and regular  rhythm.     Heart sounds: Normal heart sounds. No murmur heard. No gallop.   Pulmonary:     Effort: Pulmonary effort is normal. No respiratory distress.     Breath sounds: No stridor. No wheezing or rhonchi.  Abdominal:     General: Bowel sounds are normal. There is no distension.     Palpations: Abdomen is soft.  Musculoskeletal:     Right lower leg: Edema present.     Left lower leg: Edema present.  Skin:    Findings: Rash present.  Neurological:     Mental Status: She is alert.           Assessment & Plan:  Dysuria - Plan: Urinalysis, Routine w reflex microscopic, Urine Culture  AKI (acute kidney injury) ([place]) - Plan: BASIC METABOLIC PANEL WITH GFR  Uncontrolled type 2 diabetes mellitus with hyperglycemia (HCC)  Recurrent UTI  Therefore I see 3 issues for this patient that are putting her in the hospital repeatedly.  First is fluid status and her renal function.  Second her recurrent urinary tract infections.  Third her uncontrolled blood sugars.  Today her weight is 255 pounds however she only has trace edema and does not appear fluid overloaded.  Therefore I believe that the 4 diuretic pills in the morning and the 2 diuretic pills in the afternoon are keeping her euvolemic.  I we will recheck her renal function today along with her potassium however at the present time I recommend that she continue her current dose of torsemide.  She has an appointment next week to see her cardiologist but she appears euvolemic to me on exam today.  The second issue is recurrent urinary tract infections.  Most recently she had Enterococcus faecalis that was resistant to Keflex.  I have advised her to stop the Keflex.  I will start her on Bactrim single strength tablets due to her renal function twice daily for 7 days.  We then need to focus on prevention so I will continue her on Bactrim single strength tablets once daily indefinitely thereafter to prevent recurrent urinary tract infections as  suppressive therapy.  We will need to watch her renal function/creatinine and potassium closely on the trimethoprim.  I will see the patient back in 2 weeks to recheck her renal function and repeat a urine culture and urinalysis.  Third, the patient's uncontrolled sugars are contributing to her kidney failure as well as recurrent urinary tract infections.  I will call Dr. Rueben Bash office to see if we can expedite her appointment is right now her sugars seem out of control and I question if the patient is using the pump properly.

## 2020-09-11 ENCOUNTER — Telehealth: Payer: Self-pay | Admitting: *Deleted

## 2020-09-11 ENCOUNTER — Other Ambulatory Visit (HOSPITAL_COMMUNITY): Payer: Self-pay

## 2020-09-11 NOTE — Telephone Encounter (Signed)
Patient returned call and made aware.   Agreeable to plan.

## 2020-09-11 NOTE — Progress Notes (Signed)
Paramedicine Encounter    Patient ID: Susan [name], female    DOB: [birth year], 70 y.o.   MRN: [id number]   Patient Care Team: [name], MD as PCP - General (Family Medicine) Bensimhon, Shaune Pascal, MD as PCP - Cardiology (Cardiology) Jacelyn Pi, MD (Endocrinology) Hollie Salk Nevin Bloodgood, PA-C (Neurology) Newt Minion, MD as Consulting Physician (Orthopedic Surgery) Jorge Ny, LCSW as Social Worker (Licensed Clinical Social Worker)  Patient Active Problem List   Diagnosis Date Noted  . Acute renal failure superimposed on stage 4 chronic kidney disease ([place]) 08/22/2020  . Hypoalbuminemia 05/25/2020  . GERD (gastroesophageal reflux disease) 05/25/2020  . Pressure injury of skin 05/17/2020  . Type 2 diabetes mellitus with diabetic polyneuropathy, with long-term current use of insulin ([place]) 03/07/2020  . Obesity, Class III, BMI 40-49.9 (morbid obesity) ([place]) 03/07/2020  . Common bile duct (CBD) obstruction 05/28/2019  . Benign neoplasm of ascending colon   . Benign neoplasm of transverse colon   . Benign neoplasm of descending colon   . Benign neoplasm of sigmoid colon   . Gastric polyps   . Hyperkalemia 03/11/2019  . Prolonged QT interval 03/11/2019  . Onychomycosis 06/21/2018  . Osteomyelitis of second toe of right foot ([place])   . 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 ([place])   . PVD (peripheral vascular disease) ([place]) 10/26/2017  . E-coli UTI 07/27/2017  . Hypothyroidism 07/27/2017  . PAH (pulmonary artery hypertension) ([place])   . Impaired ambulation 07/19/2017  . Leg cramps 02/27/2017  . Peripheral edema 01/12/2017  . Diabetic neuropathy ([place]) 11/12/2016  . 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 mellitus ([place]) 10/24/2015  . Anemia 10/03/2015  . Generalized anxiety disorder 10/03/2015  . Insomnia 10/03/2015  . Hyperglycemia due to diabetes mellitus ([place]) 06/07/2015  . Chronic diastolic CHF (congestive heart failure) ([place]) 06/07/2015  . Non  compliance with medical treatment 04/17/2014  . Rotator cuff tear 03/14/2014  . Class 3 obesity 09/23/2013  . Hypotension 12/25/2012  . Urinary incontinence   . MDD (major depressive disorder) 11/12/2010  . RBBB (right bundle branch block)   . Coronary artery disease involving native coronary artery of native heart without angina pectoris   . Hyperlipemia 01/22/2009  . Essential hypertension 01/22/2009    Current Outpatient Medications:  .  acetaminophen (TYLENOL) 500 MG tablet, Take 1,000 mg by mouth every 6 (six) hours as needed for moderate pain or headache., Disp: , Rfl:  .  albuterol (VENTOLIN HFA) 108 (90 Base) MCG/ACT inhaler, TAKE 2 PUFFS BY MOUTH EVERY 6 HOURS AS NEEDED FOR WHEEZE OR SHORTNESS OF BREATH (Patient taking differently: Inhale 2 puffs into the lungs every 6 (six) hours as needed for wheezing or shortness of breath.), Disp: 8.5 g, Rfl: 1 .  allopurinol (ZYLOPRIM) 100 MG tablet, TAKE 1 TABLET BY MOUTH TWICE A DAY (Patient taking differently: Take 100 mg by mouth 2 (two) times daily.), Disp: 180 tablet, Rfl: 4 .  Ascorbic Acid (VITAMIN C) 1000 MG tablet, Take 1,000 mg by mouth daily., Disp: , Rfl:  .  aspirin EC 81 MG tablet, Take 1 tablet (81 mg total) by mouth daily with breakfast., Disp: 30 tablet, Rfl: 11 .  Bismuth Tribromoph-Petrolatum (XEROFORM PETROLAT GAUZE 5"X9") MISC, Apply 1 each topically 2 (two) times daily. For dressing changes, Disp: 50 each, Rfl: 1 .  carvedilol (COREG) 12.5 MG tablet, TAKE 1 TABLET (12.5 MG TOTAL) BY MOUTH 2 (TWO) TIMES DAILY WITH A MEAL., Disp: 180 tablet, Rfl:  1 .  cholecalciferol (VITAMIN D) 1000 units tablet, Take 1,000 Units by mouth daily with supper. , Disp: , Rfl:  .  clopidogrel (PLAVIX) 75 MG tablet, TAKE 1 TABLET BY MOUTH DAILY WITH BREAKFAST., Disp: 90 tablet, Rfl: 3 .  ferrous sulfate 325 (65 FE) MG tablet, Take 1 tablet (325 mg total) by mouth 2 (two) times daily with a meal. Reported on 08/21/2015, Disp: 60 tablet, Rfl: 2 .   FLUoxetine (PROZAC) 20 MG capsule, Take 1 capsule (20 mg total) by mouth daily., Disp: 30 capsule, Rfl: 3 .  guaiFENesin-dextromethorphan (ROBITUSSIN DM) 100-10 MG/5ML syrup, Take 10 mLs by mouth every 4 (four) hours as needed for cough., Disp: 118 mL, Rfl: 0 .  hydrocerin (EUCERIN) CREA, Apply 1 application topically 2 (two) times daily., Disp: 113 g, Rfl: 0 .  Insulin Disposable Pump (OMNIPOD DASH 5 PACK PODS) MISC, Inject into the skin See admin instructions. Use continuously with Novolin R - change every 72 hours., Disp: , Rfl:  .  insulin NPH Human (HUMULIN N,NOVOLIN N) 100 UNIT/ML injection, Inject 15 Units into the skin See admin instructions. Inject 15 units subcutaneously in the morning if CBG >300;, Disp: , Rfl:  .  insulin regular (NOVOLIN R) 100 units/mL injection, Inject into the skin See admin instructions. Manually add bolus to continuous dose via OmniPod 3 times daily per sliding scale (CBG 80-150 7 units, 151-200 9 units, 201-250 12 units, 251-300 14 units, 301- 400 17 units), Disp: , Rfl:  .  levothyroxine (SYNTHROID, LEVOTHROID) 50 MCG tablet, Take 1 tablet (50 mcg total) by mouth daily before breakfast., Disp: 90 tablet, Rfl: 1 .  magnesium oxide (MAG-OX) 400 MG tablet, TAKE 1 TABLET BY MOUTH EVERY DAY (Patient taking differently: Take 400 mg by mouth daily.), Disp: 120 tablet, Rfl: 2 .  miconazole (MICOTIN) 2 % powder, Apply topically as needed for itching., Disp: 70 g, Rfl: 0 .  Multiple Vitamins-Minerals (AIRBORNE GUMMIES PO), Take 2 tablets by mouth every evening., Disp: , Rfl:  .  Multiple Vitamins-Minerals (CENTRUM SILVER 50+WOMEN) TABS, Take 1 tablet by mouth every evening., Disp: , Rfl:  .  Multiple Vitamins-Minerals (ZINC PO), Take 1 tablet by mouth every evening., Disp: , Rfl:  .  ONETOUCH VERIO test strip, 3 (three) times daily., Disp: , Rfl:  .  pantoprazole (PROTONIX) 40 MG tablet, Take 40 mg by mouth daily., Disp: , Rfl:  .  polyethylene glycol (MIRALAX / GLYCOLAX) 17  g packet, Take 17 g by mouth daily., Disp: 14 each, Rfl: 0 .  pregabalin (LYRICA) 150 MG capsule, Take 1 capsule (150 mg total) by mouth 2 (two) times daily., Disp: 60 capsule, Rfl: 2 .  sulfamethoxazole-trimethoprim (BACTRIM) 400-80 MG tablet, Take 1 tablet by mouth 2 (two) times daily., Disp: 14 tablet, Rfl: 0 .  sulfamethoxazole-trimethoprim (BACTRIM) 400-80 MG tablet, Take 1 tablet by mouth daily. To prevent uti, Disp: 30 tablet, Rfl: 3 .  torsemide (DEMADEX) 20 MG tablet, Take 4 tablets (80 mg total) by mouth in the morning. (Patient taking differently: Take by mouth.), Disp: 180 tablet, Rfl: 3 .  traZODone (DESYREL) 100 MG tablet, TAKE 1 AND 1/2 TABLETS BY MOUTH AT BEDTIME AS NEEDED FOR SLEEP. (Patient taking differently: Take 200 mg by mouth at bedtime.), Disp: 135 tablet, Rfl: 1 .  ursodiol (ACTIGALL) 500 MG tablet, Take 500 mg by mouth 3 (three) times daily., Disp: , Rfl:  .  nitroGLYCERIN (NITROSTAT) 0.4 MG SL tablet, Place 1 tablet (0.4 mg total) under  the tongue every 5 (five) minutes as needed for chest pain. (Patient not taking: Reported on 09/11/2020), Disp: 25 tablet, Rfl: 1 Allergies  Allergen Reactions  . Drug Ingredient [Cephalexin] Diarrhea    Pt reports heavy diarrhea with use of keflex  . Codeine Nausea And Vomiting      Social History   Socioeconomic History  . Marital status: Married    Spouse name: Not on file  . Number of children: 3  . Years of education: 12th  . Highest education level: Not on file  Occupational History    Employer: UNEMPLOYED  Tobacco Use  . Smoking status: Former Smoker    Packs/day: 3.00    Years: 32.00    Pack years: 96.00    Types: Cigarettes    Quit date: 10/24/1997    Years since quitting: 22.8  . Smokeless tobacco: Never Used  Vaping Use  . Vaping Use: Never used  Substance and Sexual Activity  . Alcohol use: Not Currently    Comment: "might have 2-3 daiquiris in the summer"  . Drug use: No  . Sexual activity: Not Currently   Other Topics Concern  . Not on file  Social History Narrative   Pt lives at home with her spouse.   Caffeine Use- 3 sodas daily   Social Determinants of Health   Financial Resource Strain: Low Risk   . Difficulty of Paying Living Expenses: Not very hard  Food Insecurity: No Food Insecurity  . Worried About Charity fundraiser in the Last Year: Never true  . Ran Out of Food in the Last Year: Never true  Transportation Needs: No Transportation Needs  . Lack of Transportation (Medical): No  . Lack of Transportation (Non-Medical): No  Physical Activity: Not on file  Stress: Not on file  Social Connections: Not on file  Intimate Partner Violence: Not on file    Physical Exam      Future Appointments  Date Time Provider [place]  09/24/2020 12:30 PM [name], MD [facility] None  10/11/2020  1:30 PM [facility] PA/NP [facility] None    BP 140/70   Pulse 72   Resp 18   Wt 251 lb (113.9 kg)   SpO2 98%   BMI 40.51 kg/m  CBG PTA-250 Weight yesterday-251 Last visit weight-252  Pt reports she is doing better. Her PCP changed her antibiotic to bactrim instead of keflex and she is tolerating that better. The keflex was giving her heavy diarrhea. Her PCP will leave her on the antibiotic for long term use to prevent future UTI.  She looks good today. She has a appoint with endo on Thursday for better CBG control and management.  She had labs done yesterday and I asked triage nurse to see if she needs to come tomor for labs-she does not need to come tomor and they will forward results to provider.  I am filling 2 pill boxes for her as I will not be here next week.  Due to her labs last week her torsemide was suppose to have been cut down to 80 daily but she continued to take the 40mg  in the afternoon as well. Pill boxes filled with 80mg  torsemide daily.  I encouraged pt to get her sleep study done.    Susan [name], [facility] Paramedic   09/11/20

## 2020-09-11 NOTE — Telephone Encounter (Signed)
-----   Message from [name], MD sent at 09/10/2020  1:01 PM EDT ----- Please call Dr. Rueben Bash office and try to expedite her follow-up due to uncontrolled blood sugars.

## 2020-09-11 NOTE — Telephone Encounter (Signed)
-----   Message from Marylouise Stacks, EMT sent at 09/11/2020  2:39 PM EDT ----- Susan [name],   Due to some abnormal labs last week her torsemide was decreased to 80mg  daily, however she didn't follow that and had been taking the 40mg  extra in the afternoon. So she will now be taking 80mg  torsemide daily UFN.   Marylouise Stacks, EMT-Paramedic  09/11/20

## 2020-09-11 NOTE — Telephone Encounter (Signed)
Call placed to [facility] Dr. [name].  Appointment scheduled for Thursday, 09/13/2020 @ 12pm.  Chart notes faxed to office.   Call placed to patient to make aware. Allison.

## 2020-09-12 ENCOUNTER — Other Ambulatory Visit: Payer: Self-pay | Admitting: *Deleted

## 2020-09-12 ENCOUNTER — Telehealth: Payer: Self-pay | Admitting: *Deleted

## 2020-09-12 ENCOUNTER — Other Ambulatory Visit (HOSPITAL_COMMUNITY): Payer: HMO

## 2020-09-12 DIAGNOSIS — N179 Acute kidney failure, unspecified: Secondary | ICD-10-CM

## 2020-09-12 LAB — URINE CULTURE
MICRO NUMBER:: 11781349
SPECIMEN QUALITY:: ADEQUATE

## 2020-09-12 NOTE — Telephone Encounter (Signed)
Call placed to patient and patient made aware.   Future lab orders placed. 

## 2020-09-12 NOTE — Telephone Encounter (Signed)
Stay on torsemide 80 in am and 40 in the pm since her weight is going up.  Recheck bmp in 1 week.

## 2020-09-12 NOTE — Telephone Encounter (Signed)
Received call from patient.   Reports daily weight has been noted as follows:  18-Apr 251.4 lbs  19-Apr 251.8 lbs  20-Apr 254.3 lbs   Of note, patient was advised to not take Torsemide in afternoon on 09/11/2020 by CHF management. She is currently taking Torsemide 80mg  PO QAM.

## 2020-09-13 ENCOUNTER — Other Ambulatory Visit: Payer: Self-pay | Admitting: *Deleted

## 2020-09-13 DIAGNOSIS — N390 Urinary tract infection, site not specified: Secondary | ICD-10-CM

## 2020-09-13 DIAGNOSIS — E1165 Type 2 diabetes mellitus with hyperglycemia: Secondary | ICD-10-CM | POA: Diagnosis not present

## 2020-09-14 ENCOUNTER — Telehealth: Payer: Self-pay | Admitting: *Deleted

## 2020-09-14 NOTE — Telephone Encounter (Signed)
Received call from patient.  Reports daily weight has been noted as follows:  21-Apr 254.8 lbs  22-Apr 256.4 lbs   Patient is currently taking Torsemide 80mg  PO Q AM and 40mg  PO Q PM.   Please advise.

## 2020-09-14 NOTE — Telephone Encounter (Signed)
Tell her to reduce her fluid intake and salt consumption as she is gaining weight.

## 2020-09-15 DIAGNOSIS — I5032 Chronic diastolic (congestive) heart failure: Secondary | ICD-10-CM | POA: Diagnosis not present

## 2020-09-17 ENCOUNTER — Telehealth: Payer: Self-pay

## 2020-09-17 NOTE — Telephone Encounter (Signed)
Call placed to patient and patient made aware.  

## 2020-09-18 ENCOUNTER — Other Ambulatory Visit: Payer: Self-pay

## 2020-09-18 ENCOUNTER — Other Ambulatory Visit: Payer: HMO

## 2020-09-18 DIAGNOSIS — N390 Urinary tract infection, site not specified: Secondary | ICD-10-CM | POA: Diagnosis not present

## 2020-09-18 DIAGNOSIS — N179 Acute kidney failure, unspecified: Secondary | ICD-10-CM | POA: Diagnosis not present

## 2020-09-18 NOTE — Telephone Encounter (Signed)
Left message for pt to call bk 

## 2020-09-18 NOTE — Telephone Encounter (Signed)
Weight is up an additional 3 pounds from last week.  I would take an extra dose of torsemide tonight.  Therefore for 1 day I want her to take 80 mg in the morning and 80 mg in the afternoon.  Then resume her previous dose of 80 mg in the morning and 40 mg in the afternoon

## 2020-09-19 DIAGNOSIS — H43392 Other vitreous opacities, left eye: Secondary | ICD-10-CM | POA: Diagnosis not present

## 2020-09-19 DIAGNOSIS — H43813 Vitreous degeneration, bilateral: Secondary | ICD-10-CM | POA: Diagnosis not present

## 2020-09-19 DIAGNOSIS — E113513 Type 2 diabetes mellitus with proliferative diabetic retinopathy with macular edema, bilateral: Secondary | ICD-10-CM | POA: Diagnosis not present

## 2020-09-19 LAB — BASIC METABOLIC PANEL
BUN/Creatinine Ratio: 28 (calc) — ABNORMAL HIGH (ref 6–22)
BUN: 87 mg/dL — ABNORMAL HIGH (ref 7–25)
CO2: 24 mmol/L (ref 20–32)
Calcium: 9.2 mg/dL (ref 8.6–10.4)
Chloride: 99 mmol/L (ref 98–110)
Creat: 3.11 mg/dL — ABNORMAL HIGH (ref 0.60–0.93)
Glucose, Bld: 241 mg/dL — ABNORMAL HIGH (ref 65–99)
Potassium: 5 mmol/L (ref 3.5–5.3)
Sodium: 136 mmol/L (ref 135–146)

## 2020-09-19 LAB — URINE CULTURE
MICRO NUMBER:: 11815073
SPECIMEN QUALITY:: ADEQUATE

## 2020-09-21 ENCOUNTER — Other Ambulatory Visit: Payer: Self-pay | Admitting: Family Medicine

## 2020-09-23 DIAGNOSIS — M109 Gout, unspecified: Secondary | ICD-10-CM | POA: Diagnosis not present

## 2020-09-23 DIAGNOSIS — E1151 Type 2 diabetes mellitus with diabetic peripheral angiopathy without gangrene: Secondary | ICD-10-CM | POA: Diagnosis not present

## 2020-09-23 DIAGNOSIS — D631 Anemia in chronic kidney disease: Secondary | ICD-10-CM | POA: Diagnosis not present

## 2020-09-23 DIAGNOSIS — I5033 Acute on chronic diastolic (congestive) heart failure: Secondary | ICD-10-CM | POA: Diagnosis not present

## 2020-09-23 DIAGNOSIS — I2510 Atherosclerotic heart disease of native coronary artery without angina pectoris: Secondary | ICD-10-CM | POA: Diagnosis not present

## 2020-09-23 DIAGNOSIS — E1122 Type 2 diabetes mellitus with diabetic chronic kidney disease: Secondary | ICD-10-CM | POA: Diagnosis not present

## 2020-09-23 DIAGNOSIS — M1990 Unspecified osteoarthritis, unspecified site: Secondary | ICD-10-CM | POA: Diagnosis not present

## 2020-09-23 DIAGNOSIS — N184 Chronic kidney disease, stage 4 (severe): Secondary | ICD-10-CM | POA: Diagnosis not present

## 2020-09-23 DIAGNOSIS — E1142 Type 2 diabetes mellitus with diabetic polyneuropathy: Secondary | ICD-10-CM | POA: Diagnosis not present

## 2020-09-23 DIAGNOSIS — I130 Hypertensive heart and chronic kidney disease with heart failure and stage 1 through stage 4 chronic kidney disease, or unspecified chronic kidney disease: Secondary | ICD-10-CM | POA: Diagnosis not present

## 2020-09-23 DIAGNOSIS — E1165 Type 2 diabetes mellitus with hyperglycemia: Secondary | ICD-10-CM | POA: Diagnosis not present

## 2020-09-23 DIAGNOSIS — E785 Hyperlipidemia, unspecified: Secondary | ICD-10-CM | POA: Diagnosis not present

## 2020-09-23 DIAGNOSIS — E039 Hypothyroidism, unspecified: Secondary | ICD-10-CM | POA: Diagnosis not present

## 2020-09-24 ENCOUNTER — Encounter: Payer: Self-pay | Admitting: Family Medicine

## 2020-09-24 ENCOUNTER — Other Ambulatory Visit: Payer: Self-pay

## 2020-09-24 ENCOUNTER — Ambulatory Visit (INDEPENDENT_AMBULATORY_CARE_PROVIDER_SITE_OTHER): Payer: HMO | Admitting: Family Medicine

## 2020-09-24 ENCOUNTER — Telehealth: Payer: Self-pay

## 2020-09-24 VITALS — BP 118/72 | HR 85 | Temp 96.5°F | Ht 66.0 in | Wt 264.0 lb

## 2020-09-24 DIAGNOSIS — I5032 Chronic diastolic (congestive) heart failure: Secondary | ICD-10-CM

## 2020-09-24 DIAGNOSIS — N390 Urinary tract infection, site not specified: Secondary | ICD-10-CM

## 2020-09-24 DIAGNOSIS — N1832 Chronic kidney disease, stage 3b: Secondary | ICD-10-CM | POA: Diagnosis not present

## 2020-09-24 LAB — BASIC METABOLIC PANEL WITH GFR
BUN/Creatinine Ratio: 24 (calc) — ABNORMAL HIGH (ref 6–22)
BUN: 90 mg/dL — ABNORMAL HIGH (ref 7–25)
CO2: 24 mmol/L (ref 20–32)
Calcium: 8.8 mg/dL (ref 8.6–10.4)
Chloride: 96 mmol/L — ABNORMAL LOW (ref 98–110)
Creat: 3.75 mg/dL — ABNORMAL HIGH (ref 0.60–0.93)
GFR, Est African American: 13 mL/min/{1.73_m2} — ABNORMAL LOW (ref >=60)
GFR, Est Non African American: 11 mL/min/{1.73_m2} — ABNORMAL LOW (ref >=60)
Glucose, Bld: 413 mg/dL — ABNORMAL HIGH (ref 65–99)
Potassium: 4.8 mmol/L (ref 3.5–5.3)
Sodium: 134 mmol/L — ABNORMAL LOW (ref 135–146)

## 2020-09-24 NOTE — Progress Notes (Signed)
Subjective:    Patient ID: Susan [name], female    DOB: [birth year], 70 y.o.   MRN: [id number]  HPI 08/20/20 Patient is a 70 year old Caucasian female who was previously seen my partner but is here today establishing care with me.  My partner has since retired.  Patient has a history of insulin-dependent diabetes mellitus.  She currently sees an endocrinologist.  She is on an insulin pump.  However her most recent A1c was 11.0.  Patient reports widely variable blood sugars.  For instance this morning it was around 70.  Prior to coming to my office it was over 300.  She is performing sliding scale insulin correction in addition to her maintenance dose.  She also has congestive heart failure and is currently taking torsemide 80 mg twice a day.  She states that she is weighing herself every day and giving herself additional diuretic if her weight goes up more than 3 pounds.  However she was in this office on February 28 as well as on March 22.  In the interval time, she had gained over 20 pounds and had not notified us.  Therefore I question if she is weighing herself every day and sticking to her regimen.  She presents today reporting some mild shortness of breath.  She was discharged from the hospital yesterday.  Her creatinine at discharge was 1.8 down from over 4 on admission.  She has diuresed over 20 pounds over the last few days.  She does have still +1 edema in both legs with chronic venous stasis changes to the mid shin bilaterally.  There are 2 small blisters on the right anterior shin that are not weeping and have scabbed over.  At that time, my plan was: For the present time, I want the patient to stay on torsemide 80 mg twice daily.  I want to check a BMP today to monitor her potassium as well as her renal function.  I have asked the patient to call my office every Monday, Wednesday, and Friday with a weight update so that we can track her weight more closely.  That is the only way I can see to  prevent recurrent admission due to fluid overload because I question if the patient is altering the dose of her diuretic based on her weight.  She states that her weight according to her scales was 259 pounds this morning.  Therefore I would anticipate to try to keep her weight around 259 pounds by her report.  We will need to adjust her torsemide to maintain that and watch her renal function closely.  Of also recommended that she schedule a follow-up appointment with her endocrinologist.  Her A1c was just over 8 in December and had increased to 11 in March.  Therefore I question if her pump is functioning normally or if she has been compliant with the sliding scale.  I want her to schedule a follow-up with her endocrinologist to discuss her management.  These 2 issues I see are the most dangerous for the patient.  09/10/20 Patient was readmitted to the hospital March 29 through April 4 for acute renal failure.  Despite taking her diuretics, the patient became oliguric.  As result we recommended she go to the emergency room where she was found to have a urinary tract infection on top of her chronic kidney disease.  Urinary tract infection showed E. coli and she was treated with Rocephin.  Creatinine on admission was 3.7.  After treatment with antibiotics  it had dropped to her baseline of 1.8 at discharge on April 4.  She was seen by her cardiologist shortly thereafter.  She was complaining of a urinary tract infection and a performed a urinalysis and urine culture.  This showed Enterococcus faecalis.  They started her on Keflex for 7 days.  Her creatinine had risen to 2.5 and her potassium was up to 5.2.  However culture and sensitivity showed that the pathogen was resistant to cephalexin.  She presents today urinalysis shows trace blood, trace leukocyte esterase, 2+ protein along with 2+ glucose.  She states that she gets frequent urinary tract infections.  Given the fact that the most recent was resistant to  Keflex I suspect that this is still the Enterococcus faecalis that she was experiencing at her cardiologist office.  She also reports highly variable blood sugars.  Her sugar this morning was 270.  However she seen sugars as high as 500 and usually in the 300s. At that time, my plan was: Therefore I see 3 issues for this patient that are putting her in the hospital repeatedly.  First is fluid status and her renal function.  Second her recurrent urinary tract infections.  Third her uncontrolled blood sugars.  Today her weight is 255 pounds however she only has trace edema and does not appear fluid overloaded.  Therefore I believe that the 4 diuretic pills in the morning and the 2 diuretic pills in the afternoon are keeping her euvolemic.  I we will recheck her renal function today along with her potassium however at the present time I recommend that she continue her current dose of torsemide.  She has an appointment next week to see her cardiologist but she appears euvolemic to me on exam today.  The second issue is recurrent urinary tract infections.  Most recently she had Enterococcus faecalis that was resistant to Keflex.  I have advised her to stop the Keflex.  I will start her on Bactrim single strength tablets due to her renal function twice daily for 7 days.  We then need to focus on prevention so I will continue her on Bactrim single strength tablets once daily indefinitely thereafter to prevent recurrent urinary tract infections as suppressive therapy.  We will need to watch her renal function/creatinine and potassium closely on the trimethoprim.  I will see the patient back in 2 weeks to recheck her renal function and repeat a urine culture and urinalysis.  Third, the patient's uncontrolled sugars are contributing to her kidney failure as well as recurrent urinary tract infections.  I will call Dr. Rueben Bash office to see if we can expedite her appointment is right now her sugars seem out of control and I  question if the patient is using the pump properly.   09/24/20 Patient is due to recheck a urinalysis and urine culture to ensure resolution of her urinary tract infections on the daily prophylactic dose of Bactrim.  She is taking 80 mg of her torsemide in the morning and 40 mg of her torsemide in the evening.  However her weight has been steadily increasing: Wt Readings from Last 3 Encounters:  09/24/20 264 lb (119.7 kg)  09/11/20 251 lb (113.9 kg)  09/10/20 255 lb 12.8 oz (116 kg)   She does report shortness of breath today and dyspnea on exertion.  She denies any cough or orthopnea or chest pain Past Medical History:  Diagnosis Date  . Acute MI ([place]) 1999; 2007  . Anemia    hx  .  Anginal pain ([place])   . Anxiety   . ARF (acute renal failure) ([place]) 06/2017   [facility]  . Arthritis    "generalized" (03/15/2014)  . CAD (coronary artery disease)    MI in 2000 - MI  2007 - treated bare metal stent (no nuclear since then as 9/11)  . Carotid artery disease ([place])   . CHF (congestive heart failure) ([place])   . Chronic diastolic heart failure (HCC)    a) ECHO (08/2013) EF 55-60% and RV function nl b) RHC (08/2013) RA 4, RV 30/5/7, PA 25/10 (16), PCWP 7, Fick CO/CI 6.3/2.7, PVR 1.5 WU, PA 61 and 66%  . Daily headache    "~ every other day; since I fell in June" (03/15/2014)  . Depression   . Dyslipidemia   . Dyspnea   . Exertional shortness of breath   . HTN (hypertension)   . Hypothyroidism   . Neuropathy   . Obesity   . Osteoarthritis   . Peripheral neuropathy   . PONV (postoperative nausea and vomiting)   . RBBB (right bundle branch block)    Old  . Stroke [facility])    mini strokes  . Syncope    likely due to low blood sugar  . Tachycardia    Sinus tachycardia  . Type II diabetes mellitus (HCC)    Type II  . Urinary incontinence   . Venous insufficiency    Past Surgical History:  Procedure Laterality Date  . ABDOMINAL HYSTERECTOMY  1980's  . AMPUTATION Right  02/24/2018   Procedure: RIGHT FOOT GREAT TOE AND 2ND TOE AMPUTATION;  Surgeon: Newt Minion, MD;  Location: [place];  Service: Orthopedics;  Laterality: Right;  . AMPUTATION Right 04/30/2018   Procedure: RIGHT TRANSMETATARSAL AMPUTATION;  Surgeon: Newt Minion, MD;  Location: [place];  Service: Orthopedics;  Laterality: Right;  . BIOPSY  05/27/2020   Procedure: BIOPSY;  Surgeon: [name], DO;  [facility];  Service: Endoscopy;;  . CATARACT EXTRACTION, BILATERAL Bilateral ?2013  . COLONOSCOPY W/ POLYPECTOMY    . COLONOSCOPY WITH PROPOFOL N/A 03/13/2019   Procedure: COLONOSCOPY WITH PROPOFOL;  Surgeon: Jerene Bears, MD;  Location: [place];  Service: Gastroenterology;  Laterality: N/A;  . [place]; 2007   "1 + 1"  . ESOPHAGOGASTRODUODENOSCOPY (EGD) WITH PROPOFOL N/A 03/13/2019   Procedure: ESOPHAGOGASTRODUODENOSCOPY (EGD) WITH PROPOFOL;  Surgeon: Jerene Bears, MD;  Location: [facility] ENDOSCOPY;  Service: Gastroenterology;  Laterality: N/A;  . ESOPHAGOGASTRODUODENOSCOPY (EGD) WITH PROPOFOL N/A 05/27/2020   Procedure: ESOPHAGOGASTRODUODENOSCOPY (EGD) WITH PROPOFOL;  Surgeon: [name], DO;  [facility];  Service: Endoscopy;  Laterality: N/A;  . EYE SURGERY Bilateral    [place]  . HEMOSTASIS CLIP PLACEMENT  03/13/2019   Procedure: HEMOSTASIS CLIP PLACEMENT;  Surgeon: Jerene Bears, MD;  Location: [facility] ENDOSCOPY;  Service: Gastroenterology;;  . KNEE ARTHROSCOPY Left 10/25/2006  . POLYPECTOMY  03/13/2019   Procedure: POLYPECTOMY;  Surgeon: Jerene Bears, MD;  Location: [place];  Service: Gastroenterology;;  . RIGHT HEART CATH N/A 07/24/2017   Procedure: RIGHT HEART CATH;  Surgeon: Jolaine Artist, MD;  Location: [place] CV LAB;  Service: Cardiovascular;  Laterality: N/A;  . RIGHT HEART CATHETERIZATION N/A 09/22/2013   Procedure: RIGHT HEART CATH;  Surgeon: Jolaine Artist, MD;  [facility] CATH LAB;  Service:  Cardiovascular;  Laterality: N/A;  . SHOULDER ARTHROSCOPY WITH OPEN ROTATOR CUFF REPAIR Right 03/14/2014  Procedure: RIGHT SHOULDER ARTHROSCOPY WITH BICEPS RELEASE, OPEN SUBSCAPULA REPAIR, OPEN SUPRASPINATUS REPAIR.;  Surgeon: Meredith Pel, MD;  Location: [place];  Service: Orthopedics;  Laterality: Right;  . TOE AMPUTATION Right 02/24/2018   GREAT TOE AND 2ND TOE AMPUTATION  . TUBAL LIGATION  1970's   Current Outpatient Medications on File Prior to Visit  Medication Sig Dispense Refill  . acetaminophen (TYLENOL) 500 MG tablet Take 1,000 mg by mouth every 6 (six) hours as needed for moderate pain or headache.    . albuterol (VENTOLIN HFA) 108 (90 Base) MCG/ACT inhaler TAKE 2 PUFFS BY MOUTH EVERY 6 HOURS AS NEEDED FOR WHEEZE OR SHORTNESS OF BREATH (Patient taking differently: Inhale 2 puffs into the lungs every 6 (six) hours as needed for wheezing or shortness of breath.) 8.5 g 1  . allopurinol (ZYLOPRIM) 100 MG tablet TAKE 1 TABLET BY MOUTH TWICE A DAY (Patient taking differently: Take 100 mg by mouth 2 (two) times daily.) 180 tablet 4  . Ascorbic Acid (VITAMIN C) 1000 MG tablet Take 1,000 mg by mouth daily.    Marland Kitchen aspirin EC 81 MG tablet Take 1 tablet (81 mg total) by mouth daily with breakfast. 30 tablet 11  . Bismuth Tribromoph-Petrolatum (XEROFORM PETROLAT GAUZE 5"X9") MISC Apply 1 each topically 2 (two) times daily. For dressing changes 50 each 1  . carvedilol (COREG) 12.5 MG tablet TAKE 1 TABLET (12.5 MG TOTAL) BY MOUTH 2 (TWO) TIMES DAILY WITH A MEAL. 180 tablet 1  . cholecalciferol (VITAMIN D) 1000 units tablet Take 1,000 Units by mouth daily with supper.     . clopidogrel (PLAVIX) 75 MG tablet TAKE 1 TABLET BY MOUTH DAILY WITH BREAKFAST. 90 tablet 3  . ferrous sulfate 325 (65 FE) MG tablet Take 1 tablet (325 mg total) by mouth 2 (two) times daily with a meal. Reported on 08/21/2015 60 tablet 2  . FLUoxetine (PROZAC) 20 MG capsule TAKE 1 CAPSULE BY MOUTH EVERY DAY 90 capsule 2  .  guaiFENesin-dextromethorphan (ROBITUSSIN DM) 100-10 MG/5ML syrup Take 10 mLs by mouth every 4 (four) hours as needed for cough. 118 mL 0  . hydrocerin (EUCERIN) CREA Apply 1 application topically 2 (two) times daily. 113 g 0  . Insulin Disposable Pump (OMNIPOD DASH 5 PACK PODS) MISC Inject into the skin See admin instructions. Use continuously with Novolin R - change every 72 hours.    . insulin NPH Human (HUMULIN N,NOVOLIN N) 100 UNIT/ML injection Inject 15 Units into the skin See admin instructions. Inject 15 units subcutaneously in the morning if CBG >300;    . insulin regular (NOVOLIN R) 100 units/mL injection Inject into the skin See admin instructions. Manually add bolus to continuous dose via OmniPod 3 times daily per sliding scale (CBG 80-150 7 units, 151-200 9 units, 201-250 12 units, 251-300 14 units, 301- 400 17 units)    . levothyroxine (SYNTHROID, LEVOTHROID) 50 MCG tablet Take 1 tablet (50 mcg total) by mouth daily before breakfast. 90 tablet 1  . magnesium oxide (MAG-OX) 400 MG tablet TAKE 1 TABLET BY MOUTH EVERY DAY (Patient taking differently: Take 400 mg by mouth daily.) 120 tablet 2  . miconazole (MICOTIN) 2 % powder Apply topically as needed for itching. 70 g 0  . Multiple Vitamins-Minerals (AIRBORNE GUMMIES PO) Take 2 tablets by mouth every evening.    . Multiple Vitamins-Minerals (CENTRUM SILVER 50+WOMEN) TABS Take 1 tablet by mouth every evening.    . Multiple Vitamins-Minerals (ZINC PO) Take 1 tablet by mouth  every evening.    . nitroGLYCERIN (NITROSTAT) 0.4 MG SL tablet Place 1 tablet (0.4 mg total) under the tongue every 5 (five) minutes as needed for chest pain. 25 tablet 1  . ONETOUCH VERIO test strip 3 (three) times daily.    . pantoprazole (PROTONIX) 40 MG tablet Take 40 mg by mouth daily.    . polyethylene glycol (MIRALAX / GLYCOLAX) 17 g packet Take 17 g by mouth daily. 14 each 0  . pregabalin (LYRICA) 150 MG capsule Take 1 capsule (150 mg total) by mouth 2 (two) times  daily. 60 capsule 2  . sulfamethoxazole-trimethoprim (BACTRIM) 400-80 MG tablet Take 1 tablet by mouth 2 (two) times daily. 14 tablet 0  . sulfamethoxazole-trimethoprim (BACTRIM) 400-80 MG tablet Take 1 tablet by mouth daily. To prevent uti 30 tablet 3  . torsemide (DEMADEX) 20 MG tablet Take 4 tablets (80 mg total) by mouth in the morning. (Patient taking differently: Take by mouth.) 180 tablet 3  . traZODone (DESYREL) 100 MG tablet TAKE 1 AND 1/2 TABLETS BY MOUTH AT BEDTIME AS NEEDED FOR SLEEP. (Patient taking differently: Take 200 mg by mouth at bedtime.) 135 tablet 1  . ursodiol (ACTIGALL) 500 MG tablet Take 500 mg by mouth 3 (three) times daily.     No current facility-administered medications on file prior to visit.   Allergies  Allergen Reactions  . Drug Ingredient [Cephalexin] Diarrhea    Pt reports heavy diarrhea with use of keflex  . Codeine Nausea And Vomiting   Social History   Socioeconomic History  . Marital status: Married    Spouse name: Not on file  . Number of children: 3  . Years of education: 12th  . Highest education level: Not on file  Occupational History    Employer: UNEMPLOYED  Tobacco Use  . Smoking status: Former Smoker    Packs/day: 3.00    Years: 32.00    Pack years: 96.00    Types: Cigarettes    Quit date: 10/24/1997    Years since quitting: 22.9  . Smokeless tobacco: Never Used  Vaping Use  . Vaping Use: Never used  Substance and Sexual Activity  . Alcohol use: Not Currently    Comment: "might have 2-3 daiquiris in the summer"  . Drug use: No  . Sexual activity: Not Currently  Other Topics Concern  . Not on file  Social History Narrative   Pt lives at home with her spouse.   Caffeine Use- 3 sodas daily   Social Determinants of Health   Financial Resource Strain: Low Risk   . Difficulty of Paying Living Expenses: Not very hard  Food Insecurity: No Food Insecurity  . Worried About Charity fundraiser in the Last Year: Never true  . Ran  Out of Food in the Last Year: Never true  Transportation Needs: No Transportation Needs  . Lack of Transportation (Medical): No  . Lack of Transportation (Non-Medical): No  Physical Activity: Not on file  Stress: Not on file  Social Connections: Not on file  Intimate Partner Violence: Not on file     Review of Systems  All other systems reviewed and are negative.      Objective:   Physical Exam Vitals reviewed.  Constitutional:      General: She is not in acute distress.    Appearance: She is obese. She is not ill-appearing, toxic-appearing or diaphoretic.  Cardiovascular:     Rate and Rhythm: Normal rate and regular rhythm.  Heart sounds: Normal heart sounds. No murmur heard. No gallop.   Pulmonary:     Effort: Pulmonary effort is normal. No respiratory distress.     Breath sounds: No stridor. No wheezing or rhonchi.  Abdominal:     General: Bowel sounds are normal. There is no distension.     Palpations: Abdomen is soft.  Musculoskeletal:     Right lower leg: Edema present.     Left lower leg: Edema present.  Skin:    Findings: Rash present.  Neurological:     Mental Status: She is alert.           Assessment & Plan:  Stage 3b chronic kidney disease (HCC)  Chronic diastolic CHF (congestive heart failure) ([place]) - Plan: BASIC METABOLIC PANEL WITH GFR  Recurrent UTI - Plan: BASIC METABOLIC PANEL WITH GFR, Urine Culture  Check BMP to monitor potassium and renal function.  Increase torsemide to 80 mg in the morning and 80 mg in the afternoon.  Recheck weight and renal function in 1 week.  Recheck urine culture to ensure resolution of recurrent urinary tract infection.

## 2020-09-25 LAB — URINE CULTURE
MICRO NUMBER:: 11837672
SPECIMEN QUALITY:: ADEQUATE

## 2020-09-26 ENCOUNTER — Other Ambulatory Visit: Payer: Self-pay | Admitting: *Deleted

## 2020-09-26 DIAGNOSIS — N1832 Chronic kidney disease, stage 3b: Secondary | ICD-10-CM

## 2020-09-27 ENCOUNTER — Other Ambulatory Visit: Payer: HMO

## 2020-09-27 ENCOUNTER — Telehealth: Payer: Self-pay | Admitting: *Deleted

## 2020-09-27 ENCOUNTER — Other Ambulatory Visit: Payer: Self-pay

## 2020-09-27 DIAGNOSIS — N1832 Chronic kidney disease, stage 3b: Secondary | ICD-10-CM

## 2020-09-27 LAB — BASIC METABOLIC PANEL WITH GFR
BUN/Creatinine Ratio: 30 (calc) — ABNORMAL HIGH (ref 6–22)
BUN: 91 mg/dL — ABNORMAL HIGH (ref 7–25)
CO2: 30 mmol/L (ref 20–32)
Calcium: 9.3 mg/dL (ref 8.6–10.4)
Chloride: 100 mmol/L (ref 98–110)
Creat: 3.03 mg/dL — ABNORMAL HIGH (ref 0.60–0.93)
GFR, Est African American: 17 mL/min/{1.73_m2} — ABNORMAL LOW (ref >=60)
GFR, Est Non African American: 15 mL/min/{1.73_m2} — ABNORMAL LOW (ref >=60)
Glucose, Bld: 161 mg/dL — ABNORMAL HIGH (ref 65–99)
Potassium: 5.1 mmol/L (ref 3.5–5.3)
Sodium: 139 mmol/L (ref 135–146)

## 2020-09-27 NOTE — Telephone Encounter (Signed)
Patient was in office this AM for BMP. Will wait for results.   Call placed to patient and patient made aware.   Of note, patient reports that she had fall on 09/27/2020. States that she does not think she hurt anything. Will F/U at later time.

## 2020-09-27 NOTE — Telephone Encounter (Signed)
Call placed to patient to to follow up. States that she did call EMS to assist in getting up from the floor. Reports that there are no apparent injuries.

## 2020-09-27 NOTE — Telephone Encounter (Signed)
Good, her weight is stable.  Stay on current dose of furosemide and recheck renal fcn next week.

## 2020-09-27 NOTE — Telephone Encounter (Signed)
Received call from patient.  Reports daily weight has been noted as follows:  5/3 256.7 lbs  5/4 256.9 lbs  5/5 257.9 lbs   Patient is currently taking Torsemide 80mg  PO BID.

## 2020-09-28 ENCOUNTER — Other Ambulatory Visit (HOSPITAL_COMMUNITY): Payer: Self-pay
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
## 2020-10-01 ENCOUNTER — Ambulatory Visit (INDEPENDENT_AMBULATORY_CARE_PROVIDER_SITE_OTHER): Payer: HMO | Admitting: Family Medicine

## 2020-10-01 ENCOUNTER — Telehealth: Payer: Self-pay

## 2020-10-01 ENCOUNTER — Other Ambulatory Visit: Payer: Self-pay

## 2020-10-01 VITALS — BP 116/72 | HR 85 | Temp 98.0°F | Resp 14 | Ht 66.0 in | Wt 252.1 lb

## 2020-10-01 DIAGNOSIS — N1832 Chronic kidney disease, stage 3b: Secondary | ICD-10-CM | POA: Diagnosis not present

## 2020-10-01 DIAGNOSIS — I5032 Chronic diastolic (congestive) heart failure: Secondary | ICD-10-CM

## 2020-10-01 NOTE — Progress Notes (Signed)
Subjective:    Patient ID: Susan [name], female    DOB: [birth year], 70 y.o.   MRN: [id number]  HPI 08/20/20 Patient is a 70 year old Caucasian female who was previously seen my partner but is here today establishing care with me.  My partner has since retired.  Patient has a history of insulin-dependent diabetes mellitus.  She currently sees an endocrinologist.  She is on an insulin pump.  However her most recent A1c was 11.0.  Patient reports widely variable blood sugars.  For instance this morning it was around 70.  Prior to coming to my office it was over 300.  She is performing sliding scale insulin correction in addition to her maintenance dose.  She also has congestive heart failure and is currently taking torsemide 80 mg twice a day.  She states that she is weighing herself every day and giving herself additional diuretic if her weight goes up more than 3 pounds.  However she was in this office on February 28 as well as on March 22.  In the interval time, she had gained over 20 pounds and had not notified us.  Therefore I question if she is weighing herself every day and sticking to her regimen.  She presents today reporting some mild shortness of breath.  She was discharged from the hospital yesterday.  Her creatinine at discharge was 1.8 down from over 4 on admission.  She has diuresed over 20 pounds over the last few days.  She does have still +1 edema in both legs with chronic venous stasis changes to the mid shin bilaterally.  There are 2 small blisters on the right anterior shin that are not weeping and have scabbed over.  At that time, my plan was: For the present time, I want the patient to stay on torsemide 80 mg twice daily.  I want to check a BMP today to monitor her potassium as well as her renal function.  I have asked the patient to call my office every Monday, Wednesday, and Friday with a weight update so that we can track her weight more closely.  That is the only way I can see to  prevent recurrent admission due to fluid overload because I question if the patient is altering the dose of her diuretic based on her weight.  She states that her weight according to her scales was 259 pounds this morning.  Therefore I would anticipate to try to keep her weight around 259 pounds by her report.  We will need to adjust her torsemide to maintain that and watch her renal function closely.  Of also recommended that she schedule a follow-up appointment with her endocrinologist.  Her A1c was just over 8 in December and had increased to 11 in March.  Therefore I question if her pump is functioning normally or if she has been compliant with the sliding scale.  I want her to schedule a follow-up with her endocrinologist to discuss her management.  These 2 issues I see are the most dangerous for the patient.  09/10/20 Patient was readmitted to the hospital March 29 through April 4 for acute renal failure.  Despite taking her diuretics, the patient became oliguric.  As result we recommended she go to the emergency room where she was found to have a urinary tract infection on top of her chronic kidney disease.  Urinary tract infection showed E. coli and she was treated with Rocephin.  Creatinine on admission was 3.7.  After treatment with antibiotics  it had dropped to her baseline of 1.8 at discharge on April 4.  She was seen by her cardiologist shortly thereafter.  She was complaining of a urinary tract infection and a performed a urinalysis and urine culture.  This showed Enterococcus faecalis.  They started her on Keflex for 7 days.  Her creatinine had risen to 2.5 and her potassium was up to 5.2.  However culture and sensitivity showed that the pathogen was resistant to cephalexin.  She presents today urinalysis shows trace blood, trace leukocyte esterase, 2+ protein along with 2+ glucose.  She states that she gets frequent urinary tract infections.  Given the fact that the most recent was resistant to  Keflex I suspect that this is still the Enterococcus faecalis that she was experiencing at her cardiologist office.  She also reports highly variable blood sugars.  Her sugar this morning was 270.  However she seen sugars as high as 500 and usually in the 300s. At that time, my plan was: Therefore I see 3 issues for this patient that are putting her in the hospital repeatedly.  First is fluid status and her renal function.  Second her recurrent urinary tract infections.  Third her uncontrolled blood sugars.  Today her weight is 255 pounds however she only has trace edema and does not appear fluid overloaded.  Therefore I believe that the 4 diuretic pills in the morning and the 2 diuretic pills in the afternoon are keeping her euvolemic.  I we will recheck her renal function today along with her potassium however at the present time I recommend that she continue her current dose of torsemide.  She has an appointment next week to see her cardiologist but she appears euvolemic to me on exam today.  The second issue is recurrent urinary tract infections.  Most recently she had Enterococcus faecalis that was resistant to Keflex.  I have advised her to stop the Keflex.  I will start her on Bactrim single strength tablets due to her renal function twice daily for 7 days.  We then need to focus on prevention so I will continue her on Bactrim single strength tablets once daily indefinitely thereafter to prevent recurrent urinary tract infections as suppressive therapy.  We will need to watch her renal function/creatinine and potassium closely on the trimethoprim.  I will see the patient back in 2 weeks to recheck her renal function and repeat a urine culture and urinalysis.  Third, the patient's uncontrolled sugars are contributing to her kidney failure as well as recurrent urinary tract infections.  I will call Dr. Rueben Bash office to see if we can expedite her appointment is right now her sugars seem out of control and I  question if the patient is using the pump properly.   09/24/20 Patient is due to recheck a urinalysis and urine culture to ensure resolution of her urinary tract infections on the daily prophylactic dose of Bactrim.  She is taking 80 mg of her torsemide in the morning and 40 mg of her torsemide in the evening.  However her weight has been steadily increasing:  She does report shortness of breath today and dyspnea on exertion.  She denies any cough or orthopnea or chest pain  At that time, my plan was: Check BMP to monitor potassium and renal function.  Increase torsemide to 80 mg in the morning and 80 mg in the afternoon.  Recheck weight and renal function in 1 week.  Recheck urine culture to ensure resolution of  recurrent urinary tract infection.  10/01/20 After increasing torsemide to 80 mg twice a day, her weight begin to trend down.  Is been stable over the weekend at 252 pounds.  Also her renal function/creatinine last week had improved with 3.03 down from 3.75.  This was despite taking an increased dose of diuretic.  Therefore I believe that we have found a good baseline dose for the patient is maintaining her weight but not exacerbating her renal function.  She is also no longer having urinary tract infections on the prophylactic antibiotic.  Unfortunately her sugars remain out of control per her report.  She states that her third blood sugar this morning was 375.  She is already met with her endocrinologist and they have made some changes but he is she is yet to follow-up again. Past Medical History:  Diagnosis Date  . Acute MI ([place]) 1999; 2007  . Anemia    hx  . Anginal pain ([place])   . Anxiety   . ARF (acute renal failure) ([place]) 06/2017   [facility]  . Arthritis    "generalized" (03/15/2014)  . CAD (coronary artery disease)    MI in 2000 - MI  2007 - treated bare metal stent (no nuclear since then as 9/11)  . Carotid artery disease ([place])   . CHF (congestive heart failure) ([place])    . Chronic diastolic heart failure (HCC)    a) ECHO (08/2013) EF 55-60% and RV function nl b) RHC (08/2013) RA 4, RV 30/5/7, PA 25/10 (16), PCWP 7, Fick CO/CI 6.3/2.7, PVR 1.5 WU, PA 61 and 66%  . Daily headache    "~ every other day; since I fell in June" (03/15/2014)  . Depression   . Dyslipidemia   . Dyspnea   . Exertional shortness of breath   . HTN (hypertension)   . Hypothyroidism   . Neuropathy   . Obesity   . Osteoarthritis   . Peripheral neuropathy   . PONV (postoperative nausea and vomiting)   . RBBB (right bundle branch block)    Old  . Stroke [facility])    mini strokes  . Syncope    likely due to low blood sugar  . Tachycardia    Sinus tachycardia  . Type II diabetes mellitus (HCC)    Type II  . Urinary incontinence   . Venous insufficiency    Past Surgical History:  Procedure Laterality Date  . ABDOMINAL HYSTERECTOMY  1980's  . AMPUTATION Right 02/24/2018   Procedure: RIGHT FOOT GREAT TOE AND 2ND TOE AMPUTATION;  Surgeon: Newt Minion, MD;  Location: [place];  Service: Orthopedics;  Laterality: Right;  . AMPUTATION Right 04/30/2018   Procedure: RIGHT TRANSMETATARSAL AMPUTATION;  Surgeon: Newt Minion, MD;  Location: [place];  Service: Orthopedics;  Laterality: Right;  . BIOPSY  05/27/2020   Procedure: BIOPSY;  Surgeon: [name], DO;  [facility];  Service: Endoscopy;;  . CATARACT EXTRACTION, BILATERAL Bilateral ?2013  . COLONOSCOPY W/ POLYPECTOMY    . COLONOSCOPY WITH PROPOFOL N/A 03/13/2019   Procedure: COLONOSCOPY WITH PROPOFOL;  Surgeon: Jerene Bears, MD;  Location: [place];  Service: Gastroenterology;  Laterality: N/A;  . [place]; 2007   "1 + 1"  . ESOPHAGOGASTRODUODENOSCOPY (EGD) WITH PROPOFOL N/A 03/13/2019   Procedure: ESOPHAGOGASTRODUODENOSCOPY (EGD) WITH PROPOFOL;  Surgeon: Jerene Bears, MD;  Location: [facility] ENDOSCOPY;  Service: Gastroenterology;  Laterality: N/A;  . ESOPHAGOGASTRODUODENOSCOPY  (EGD) WITH PROPOFOL  N/A 05/27/2020   Procedure: ESOPHAGOGASTRODUODENOSCOPY (EGD) WITH PROPOFOL;  Surgeon: [name], DO;  [facility];  Service: Endoscopy;  Laterality: N/A;  . EYE SURGERY Bilateral    [place]  . HEMOSTASIS CLIP PLACEMENT  03/13/2019   Procedure: HEMOSTASIS CLIP PLACEMENT;  Surgeon: Jerene Bears, MD;  Location[facility] ENDOSCOPY;  Service: Gastroenterology;;  . KNEE ARTHROSCOPY Left 10/25/2006  . POLYPECTOMY  03/13/2019   Procedure: POLYPECTOMY;  Surgeon: Jerene Bears, MD;  Location: [place];  Service: Gastroenterology;;  . RIGHT HEART CATH N/A 07/24/2017   Procedure: RIGHT HEART CATH;  Surgeon: Jolaine Artist, MD;  Location: [place] CV LAB;  Service: Cardiovascular;  Laterality: N/A;  . RIGHT HEART CATHETERIZATION N/A 09/22/2013   Procedure: RIGHT HEART CATH;  Surgeon: Jolaine Artist, MD;  [facility];  Service: Cardiovascular;  Laterality: N/A;  . SHOULDER ARTHROSCOPY WITH OPEN ROTATOR CUFF REPAIR Right 03/14/2014   Procedure: RIGHT SHOULDER ARTHROSCOPY WITH BICEPS RELEASE, OPEN SUBSCAPULA REPAIR, OPEN SUPRASPINATUS REPAIR.;  Surgeon: Meredith Pel, MD;  Location: [place];  Service: Orthopedics;  Laterality: Right;  . TOE AMPUTATION Right 02/24/2018   GREAT TOE AND 2ND TOE AMPUTATION  . TUBAL LIGATION  1970's   Current Outpatient Medications on File Prior to Visit  Medication Sig Dispense Refill  . acetaminophen (TYLENOL) 500 MG tablet Take 1,000 mg by mouth every 6 (six) hours as needed for moderate pain or headache.    . albuterol (VENTOLIN HFA) 108 (90 Base) MCG/ACT inhaler TAKE 2 PUFFS BY MOUTH EVERY 6 HOURS AS NEEDED FOR WHEEZE OR SHORTNESS OF BREATH (Patient taking differently: Inhale 2 puffs into the lungs every 6 (six) hours as needed for wheezing or shortness of breath.) 8.5 g 1  . allopurinol (ZYLOPRIM) 100 MG tablet TAKE 1 TABLET BY MOUTH TWICE A DAY (Patient taking differently: Take 100 mg by mouth 2 (two) times daily.)  180 tablet 4  . Ascorbic Acid (VITAMIN C) 1000 MG tablet Take 1,000 mg by mouth daily.    Marland Kitchen aspirin EC 81 MG tablet Take 1 tablet (81 mg total) by mouth daily with breakfast. 30 tablet 11  . Bismuth Tribromoph-Petrolatum (XEROFORM PETROLAT GAUZE 5"X9") MISC Apply 1 each topically 2 (two) times daily. For dressing changes 50 each 1  . carvedilol (COREG) 12.5 MG tablet TAKE 1 TABLET (12.5 MG TOTAL) BY MOUTH 2 (TWO) TIMES DAILY WITH A MEAL. 180 tablet 1  . cholecalciferol (VITAMIN D) 1000 units tablet Take 1,000 Units by mouth daily with supper.     . clopidogrel (PLAVIX) 75 MG tablet TAKE 1 TABLET BY MOUTH DAILY WITH BREAKFAST. 90 tablet 3  . ferrous sulfate 325 (65 FE) MG tablet Take 1 tablet (325 mg total) by mouth 2 (two) times daily with a meal. Reported on 08/21/2015 60 tablet 2  . FLUoxetine (PROZAC) 20 MG capsule TAKE 1 CAPSULE BY MOUTH EVERY DAY 90 capsule 2  . guaiFENesin-dextromethorphan (ROBITUSSIN DM) 100-10 MG/5ML syrup Take 10 mLs by mouth every 4 (four) hours as needed for cough. 118 mL 0  . hydrocerin (EUCERIN) CREA Apply 1 application topically 2 (two) times daily. 113 g 0  . Insulin Disposable Pump (OMNIPOD DASH 5 PACK PODS) MISC Inject into the skin See admin instructions. Use continuously with Novolin R - change every 72 hours.    . insulin NPH Human (HUMULIN N,NOVOLIN N) 100 UNIT/ML injection Inject 15 Units into the skin See admin instructions. Inject 15 units subcutaneously in the morning  if CBG >300;    . insulin regular (NOVOLIN R) 100 units/mL injection Inject into the skin See admin instructions. Manually add bolus to continuous dose via OmniPod 3 times daily per sliding scale (CBG 80-150 7 units, 151-200 9 units, 201-250 12 units, 251-300 14 units, 301- 400 17 units)    . levothyroxine (SYNTHROID, LEVOTHROID) 50 MCG tablet Take 1 tablet (50 mcg total) by mouth daily before breakfast. 90 tablet 1  . magnesium oxide (MAG-OX) 400 MG tablet TAKE 1 TABLET BY MOUTH EVERY DAY  (Patient taking differently: Take 400 mg by mouth daily.) 120 tablet 2  . miconazole (MICOTIN) 2 % powder Apply topically as needed for itching. 70 g 0  . Multiple Vitamins-Minerals (AIRBORNE GUMMIES PO) Take 2 tablets by mouth every evening.    . Multiple Vitamins-Minerals (CENTRUM SILVER 50+WOMEN) TABS Take 1 tablet by mouth every evening.    . Multiple Vitamins-Minerals (ZINC PO) Take 1 tablet by mouth every evening.    . nitroGLYCERIN (NITROSTAT) 0.4 MG SL tablet Place 1 tablet (0.4 mg total) under the tongue every 5 (five) minutes as needed for chest pain. 25 tablet 1  . ONETOUCH VERIO test strip 3 (three) times daily.    . pantoprazole (PROTONIX) 40 MG tablet Take 40 mg by mouth daily.    . polyethylene glycol (MIRALAX / GLYCOLAX) 17 g packet Take 17 g by mouth daily. 14 each 0  . pregabalin (LYRICA) 150 MG capsule Take 1 capsule (150 mg total) by mouth 2 (two) times daily. 60 capsule 2  . sulfamethoxazole-trimethoprim (BACTRIM) 400-80 MG tablet Take 1 tablet by mouth 2 (two) times daily. 14 tablet 0  . sulfamethoxazole-trimethoprim (BACTRIM) 400-80 MG tablet Take 1 tablet by mouth daily. To prevent uti 30 tablet 3  . torsemide (DEMADEX) 20 MG tablet Take 4 tablets (80 mg total) by mouth in the morning. (Patient taking differently: Take by mouth.) 180 tablet 3  . traZODone (DESYREL) 100 MG tablet TAKE 1 AND 1/2 TABLETS BY MOUTH AT BEDTIME AS NEEDED FOR SLEEP. (Patient taking differently: Take 200 mg by mouth at bedtime.) 135 tablet 1  . ursodiol (ACTIGALL) 500 MG tablet Take 500 mg by mouth 3 (three) times daily.     No current facility-administered medications on file prior to visit.   Allergies  Allergen Reactions  . Drug Ingredient [Cephalexin] Diarrhea    Pt reports heavy diarrhea with use of keflex  . Codeine Nausea And Vomiting   Social History   Socioeconomic History  . Marital status: Married    Spouse name: Not on file  . Number of children: 3  . Years of education: 12th   . Highest education level: Not on file  Occupational History    Employer: UNEMPLOYED  Tobacco Use  . Smoking status: Former Smoker    Packs/day: 3.00    Years: 32.00    Pack years: 96.00    Types: Cigarettes    Quit date: 10/24/1997    Years since quitting: 22.9  . Smokeless tobacco: Never Used  Vaping Use  . Vaping Use: Never used  Substance and Sexual Activity  . Alcohol use: Not Currently    Comment: "might have 2-3 daiquiris in the summer"  . Drug use: No  . Sexual activity: Not Currently  Other Topics Concern  . Not on file  Social History Narrative   Pt lives at home with her spouse.   Caffeine Use- 3 sodas daily   Social Determinants of Health   Financial  Resource Strain: Low Risk   . Difficulty of Paying Living Expenses: Not very hard  Food Insecurity: No Food Insecurity  . Worried About Charity fundraiser in the Last Year: Never true  . Ran Out of Food in the Last Year: Never true  Transportation Needs: No Transportation Needs  . Lack of Transportation (Medical): No  . Lack of Transportation (Non-Medical): No  Physical Activity: Not on file  Stress: Not on file  Social Connections: Not on file  Intimate Partner Violence: Not on file     Review of Systems  All other systems reviewed and are negative.      Objective:   Physical Exam Vitals reviewed.  Constitutional:      General: She is not in acute distress.    Appearance: She is obese. She is not ill-appearing, toxic-appearing or diaphoretic.  Cardiovascular:     Rate and Rhythm: Normal rate and regular rhythm.     Heart sounds: Normal heart sounds. No murmur heard. No gallop.   Pulmonary:     Effort: Pulmonary effort is normal. No respiratory distress.     Breath sounds: No stridor. No wheezing or rhonchi.  Abdominal:     General: Bowel sounds are normal. There is no distension.     Palpations: Abdomen is soft.  Musculoskeletal:     Right lower leg: No edema.     Left lower leg: No edema.   Neurological:     Mental Status: She is alert.           Assessment & Plan:  Stage 3b chronic kidney disease ([place]) - Plan: BASIC METABOLIC PANEL WITH GFR  Chronic diastolic CHF (congestive heart failure) ([place]) - Plan: BASIC METABOLIC PANEL WITH GFR  Patient appears euvolemic today at 252 pounds.  The swelling in her legs has improved.  She has maintained her weight for 3 straight days 252 pounds.  Her renal function last week was improved despite taking torsemide 80 mg twice a day.  Her urinary tract infections have stopped since she started the prophylactic antibiotic.  Therefore I believe that we have the majority of her condition stable except her blood sugar.  Her blood sugars seem to be highly erratic.  Therefore I have encouraged the patient to eat more consistently and not eat so much sugar/junk.  She admits to eating cake and drinking soda last night.  I recommended that she follow-up with her endocrinologist as planned to try to help maintain her blood sugars between 102 100.  Also explained that her sugars are running high will likely only expedite the decline in her renal function

## 2020-10-02 ENCOUNTER — Telehealth: Payer: Self-pay

## 2020-10-02 LAB — BASIC METABOLIC PANEL WITH GFR
BUN/Creatinine Ratio: 22 (calc) (ref 6–22)
BUN: 81 mg/dL — ABNORMAL HIGH (ref 7–25)
CO2: 30 mmol/L (ref 20–32)
Calcium: 9.2 mg/dL (ref 8.6–10.4)
Chloride: 97 mmol/L — ABNORMAL LOW (ref 98–110)
Creat: 3.75 mg/dL — ABNORMAL HIGH (ref 0.60–0.93)
GFR, Est African American: 13 mL/min/{1.73_m2} — ABNORMAL LOW (ref >=60)
GFR, Est Non African American: 11 mL/min/{1.73_m2} — ABNORMAL LOW (ref >=60)
Glucose, Bld: 55 mg/dL — ABNORMAL LOW (ref 65–99)
Potassium: 4.6 mmol/L (ref 3.5–5.3)
Sodium: 139 mmol/L (ref 135–146)

## 2020-10-02 NOTE — Telephone Encounter (Signed)
Yes - that would be fine

## 2020-10-02 NOTE — Telephone Encounter (Signed)
Spoke with pt, verbalized understanding.

## 2020-10-04 ENCOUNTER — Telehealth: Payer: Self-pay | Admitting: *Deleted

## 2020-10-04 NOTE — Telephone Encounter (Signed)
Received call from patient.  Reports daily weight has been noted as follows:  5/10 252.1 lbs  5/11 252.6 lbs  5/12 255.7 lbs   Noted 3 lb weight gain overnight.   Patient is currently taking Torsemide 80mg  PO BID.  Please advise.

## 2020-10-04 NOTE — Telephone Encounter (Signed)
PCP reviewed and recommendations are as follows: Take additional Torsemide 40mg  now x1 dose. Call with weight in AM. Avoid all salt.   Call placed to patient and patient made aware.

## 2020-10-05 NOTE — Telephone Encounter (Signed)
Received call from patient.  Reports daily weight has been noted as follows:  5/11 252.6 lbs  5/12 255.7 lbs  5/13 256.9 lbs   Patient reports that she did take additional Toresemide 40mg  at 5pm.   Please advise. e

## 2020-10-08 ENCOUNTER — Encounter: Payer: Self-pay | Admitting: Family Medicine

## 2020-10-08 ENCOUNTER — Other Ambulatory Visit: Payer: Self-pay

## 2020-10-08 ENCOUNTER — Ambulatory Visit (INDEPENDENT_AMBULATORY_CARE_PROVIDER_SITE_OTHER): Payer: HMO | Admitting: Family Medicine

## 2020-10-08 VITALS — BP 118/72 | HR 86 | Temp 98.0°F | Resp 14 | Ht 66.0 in | Wt 257.2 lb

## 2020-10-08 DIAGNOSIS — N1832 Chronic kidney disease, stage 3b: Secondary | ICD-10-CM | POA: Diagnosis not present

## 2020-10-08 NOTE — Progress Notes (Signed)
Subjective:    Patient ID: Susan [name], female    DOB: [birth year], 70 y.o.   MRN: [id number]  HPI 08/20/20 Patient is a 70 year old Caucasian female who was previously seen my partner but is here today establishing care with me.  My partner has since retired.  Patient has a history of insulin-dependent diabetes mellitus.  She currently sees an endocrinologist.  She is on an insulin pump.  However her most recent A1c was 11.0.  Patient reports widely variable blood sugars.  For instance this morning it was around 70.  Prior to coming to my office it was over 300.  She is performing sliding scale insulin correction in addition to her maintenance dose.  She also has congestive heart failure and is currently taking torsemide 80 mg twice a day.  She states that she is weighing herself every day and giving herself additional diuretic if her weight goes up more than 3 pounds.  However she was in this office on February 28 as well as on March 22.  In the interval time, she had gained over 20 pounds and had not notified us.  Therefore I question if she is weighing herself every day and sticking to her regimen.  She presents today reporting some mild shortness of breath.  She was discharged from the hospital yesterday.  Her creatinine at discharge was 1.8 down from over 4 on admission.  She has diuresed over 20 pounds over the last few days.  She does have still +1 edema in both legs with chronic venous stasis changes to the mid shin bilaterally.  There are 2 small blisters on the right anterior shin that are not weeping and have scabbed over.  At that time, my plan was: For the present time, I want the patient to stay on torsemide 80 mg twice daily.  I want to check a BMP today to monitor her potassium as well as her renal function.  I have asked the patient to call my office every Monday, Wednesday, and Friday with a weight update so that we can track her weight more closely.  That is the only way I can see to  prevent recurrent admission due to fluid overload because I question if the patient is altering the dose of her diuretic based on her weight.  She states that her weight according to her scales was 259 pounds this morning.  Therefore I would anticipate to try to keep her weight around 259 pounds by her report.  We will need to adjust her torsemide to maintain that and watch her renal function closely.  Of also recommended that she schedule a follow-up appointment with her endocrinologist.  Her A1c was just over 8 in December and had increased to 11 in March.  Therefore I question if her pump is functioning normally or if she has been compliant with the sliding scale.  I want her to schedule a follow-up with her endocrinologist to discuss her management.  These 2 issues I see are the most dangerous for the patient.  09/10/20 Patient was readmitted to the hospital March 29 through April 4 for acute renal failure.  Despite taking her diuretics, the patient became oliguric.  As result we recommended she go to the emergency room where she was found to have a urinary tract infection on top of her chronic kidney disease.  Urinary tract infection showed E. coli and she was treated with Rocephin.  Creatinine on admission was 3.7.  After treatment with antibiotics  it had dropped to her baseline of 1.8 at discharge on April 4.  She was seen by her cardiologist shortly thereafter.  She was complaining of a urinary tract infection and a performed a urinalysis and urine culture.  This showed Enterococcus faecalis.  They started her on Keflex for 7 days.  Her creatinine had risen to 2.5 and her potassium was up to 5.2.  However culture and sensitivity showed that the pathogen was resistant to cephalexin.  She presents today urinalysis shows trace blood, trace leukocyte esterase, 2+ protein along with 2+ glucose.  She states that she gets frequent urinary tract infections.  Given the fact that the most recent was resistant to  Keflex I suspect that this is still the Enterococcus faecalis that she was experiencing at her cardiologist office.  She also reports highly variable blood sugars.  Her sugar this morning was 270.  However she seen sugars as high as 500 and usually in the 300s. At that time, my plan was: Therefore I see 3 issues for this patient that are putting her in the hospital repeatedly.  First is fluid status and her renal function.  Second her recurrent urinary tract infections.  Third her uncontrolled blood sugars.  Today her weight is 255 pounds however she only has trace edema and does not appear fluid overloaded.  Therefore I believe that the 4 diuretic pills in the morning and the 2 diuretic pills in the afternoon are keeping her euvolemic.  I we will recheck her renal function today along with her potassium however at the present time I recommend that she continue her current dose of torsemide.  She has an appointment next week to see her cardiologist but she appears euvolemic to me on exam today.  The second issue is recurrent urinary tract infections.  Most recently she had Enterococcus faecalis that was resistant to Keflex.  I have advised her to stop the Keflex.  I will start her on Bactrim single strength tablets due to her renal function twice daily for 7 days.  We then need to focus on prevention so I will continue her on Bactrim single strength tablets once daily indefinitely thereafter to prevent recurrent urinary tract infections as suppressive therapy.  We will need to watch her renal function/creatinine and potassium closely on the trimethoprim.  I will see the patient back in 2 weeks to recheck her renal function and repeat a urine culture and urinalysis.  Third, the patient's uncontrolled sugars are contributing to her kidney failure as well as recurrent urinary tract infections.  I will call Dr. Rueben Bash office to see if we can expedite her appointment is right now her sugars seem out of control and I  question if the patient is using the pump properly.   09/24/20 Patient is due to recheck a urinalysis and urine culture to ensure resolution of her urinary tract infections on the daily prophylactic dose of Bactrim.  She is taking 80 mg of her torsemide in the morning and 40 mg of her torsemide in the evening.  However her weight has been steadily increasing:  She does report shortness of breath today and dyspnea on exertion.  She denies any cough or orthopnea or chest pain  At that time, my plan was: Check BMP to monitor potassium and renal function.  Increase torsemide to 80 mg in the morning and 80 mg in the afternoon.  Recheck weight and renal function in 1 week.  Recheck urine culture to ensure resolution of  recurrent urinary tract infection.  10/01/20 After increasing torsemide to 80 mg twice a day, her weight begin to trend down.  Is been stable over the weekend at 252 pounds.  Also her renal function/creatinine last week had improved with 3.03 down from 3.75.  This was despite taking an increased dose of diuretic.  Therefore I believe that we have found a good baseline dose for the patient is maintaining her weight but not exacerbating her renal function.  She is also no longer having urinary tract infections on the prophylactic antibiotic.  Unfortunately her sugars remain out of control per her report.  She states that her third blood sugar this morning was 375.  She is already met with her endocrinologist and they have made some changes but he is she is yet to follow-up again.  At that time, my plan was: Patient appears euvolemic today at 252 pounds.  The swelling in her legs has improved.  She has maintained her weight for 3 straight days 252 pounds.  Her renal function last week was improved despite taking torsemide 80 mg twice a day.  Her urinary tract infections have stopped since she started the prophylactic antibiotic.  Therefore I believe that we have the majority of her condition stable  except her blood sugar.  Her blood sugars seem to be highly erratic.  Therefore I have encouraged the patient to eat more consistently and not eat so much sugar/junk.  She admits to eating cake and drinking soda last night.  I recommended that she follow-up with her endocrinologist as planned to try to help maintain her blood sugars between 100-200.  Also explained that her sugars are running high will likely only expedite the decline in her renal function.  10/08/20 Wt Readings from Last 3 Encounters:  10/08/20 257 lb 3.2 oz (116.7 kg)  10/01/20 252 lb 1.6 oz (114.4 kg)  09/28/20 254 lb (115.2 kg)   Patient was calling at the end of last week stating that her weight was increasing 3 and 4 and 5 pounds.  This was despite increasing her Lasix to 80 mg twice a day which had worked well.  Also her renal function had worsened and her creatinine had risen from 3-3.75.  As result we asked the patient to come in for evaluation.  However over the weekend, she decreased her sodium consumption and her weight dropped from 257 pounds on her scale to 253 pounds this morning.  Here today on our scale she is 257 however that is fully dressed and also caring an umbrella.  She has trace bipedal edema but her lungs are completely clear to auscultation and there is no significant swelling over her body.  She admits that at the end of last week she was eating Fritos and chips loaded and salt.  First she is a diabetic so eating chips is a poor dietary choice and I explained this to her.  Second I explained the association between sodium and water and fluid retention and weight gain.  This is most likely the reason that her weight increased several pounds at the end of last week.  It is also likely the reason her weight has dropped several pounds over the weekend as she started avoiding Fritos and chips.  Past Medical History:  Diagnosis Date  . Acute MI ([place]) 1999; 2007  . Anemia    hx  . Anginal pain ([place])   . Anxiety    . ARF (acute renal failure) ([place]) 06/2017  [facility]  . Arthritis    "generalized" (03/15/2014)  . CAD (coronary artery disease)    MI in 2000 - MI  2007 - treated bare metal stent (no nuclear since then as 9/11)  . Carotid artery disease ([place])   . CHF (congestive heart failure) ([place])   . Chronic diastolic heart failure (HCC)    a) ECHO (08/2013) EF 55-60% and RV function nl b) RHC (08/2013) RA 4, RV 30/5/7, PA 25/10 (16), PCWP 7, Fick CO/CI 6.3/2.7, PVR 1.5 WU, PA 61 and 66%  . Daily headache    "~ every other day; since I fell in June" (03/15/2014)  . Depression   . Dyslipidemia   . Dyspnea   . Exertional shortness of breath   . HTN (hypertension)   . Hypothyroidism   . Neuropathy   . Obesity   . Osteoarthritis   . Peripheral neuropathy   . PONV (postoperative nausea and vomiting)   . RBBB (right bundle branch block)    Old  . Stroke [facility])    mini strokes  . Syncope    likely due to low blood sugar  . Tachycardia    Sinus tachycardia  . Type II diabetes mellitus (HCC)    Type II  . Urinary incontinence   . Venous insufficiency    Past Surgical History:  Procedure Laterality Date  . ABDOMINAL HYSTERECTOMY  1980's  . AMPUTATION Right 02/24/2018   Procedure: RIGHT FOOT GREAT TOE AND 2ND TOE AMPUTATION;  Surgeon: Newt Minion, MD;  Location: [place];  Service: Orthopedics;  Laterality: Right;  . AMPUTATION Right 04/30/2018   Procedure: RIGHT TRANSMETATARSAL AMPUTATION;  Surgeon: Newt Minion, MD;  Location: [place];  Service: Orthopedics;  Laterality: Right;  . BIOPSY  05/27/2020   Procedure: BIOPSY;  Surgeon: [name], DO;  [facility];  Service: Endoscopy;;  . CATARACT EXTRACTION, BILATERAL Bilateral ?2013  . COLONOSCOPY W/ POLYPECTOMY    . COLONOSCOPY WITH PROPOFOL N/A 03/13/2019   Procedure: COLONOSCOPY WITH PROPOFOL;  Surgeon: Jerene Bears, MD;  Location: [place];  Service: Gastroenterology;  Laterality: N/A;  . [place]; 2007   "1 + 1"  . ESOPHAGOGASTRODUODENOSCOPY (EGD) WITH PROPOFOL N/A 03/13/2019   Procedure: ESOPHAGOGASTRODUODENOSCOPY (EGD) WITH PROPOFOL;  Surgeon: Jerene Bears, MD;  Location[facility] ENDOSCOPY;  Service: Gastroenterology;  Laterality: N/A;  . ESOPHAGOGASTRODUODENOSCOPY (EGD) WITH PROPOFOL N/A 05/27/2020   Procedure: ESOPHAGOGASTRODUODENOSCOPY (EGD) WITH PROPOFOL;  Surgeon: [name], DO;  [facility];  Service: Endoscopy;  Laterality: N/A;  . EYE SURGERY Bilateral    lazer  . HEMOSTASIS CLIP PLACEMENT  03/13/2019   Procedure: HEMOSTASIS CLIP PLACEMENT;  Surgeon: Jerene Bears, MD;  [facility] ENDOSCOPY;  Service: Gastroenterology;;  . KNEE ARTHROSCOPY Left 10/25/2006  . POLYPECTOMY  03/13/2019   Procedure: POLYPECTOMY;  Surgeon: Jerene Bears, MD;  Location: [place];  Service: Gastroenterology;;  . RIGHT HEART CATH N/A 07/24/2017   Procedure: RIGHT HEART CATH;  Surgeon: Jolaine Artist, MD;  Location: [place] CV LAB;  Service: Cardiovascular;  Laterality: N/A;  . RIGHT HEART CATHETERIZATION N/A 09/22/2013   Procedure: RIGHT HEART CATH;  Surgeon: Jolaine Artist, MD;  Location[facility] C[facility] LAB;  Service: Cardiovascular;  Laterality: N/A;  . SHOULDER ARTHROSCOPY WITH OPEN ROTATOR CUFF REPAIR Right 03/14/2014   Procedure: RIGHT SHOULDER ARTHROSCOPY WITH BICEPS RELEASE, OPEN SUBSCAPULA REPAIR, OPEN SUPRASPINATUS REPAIR.;  Surgeon: Meredith Pel,  MD;  Location: [place];  Service: Orthopedics;  Laterality: Right;  . TOE AMPUTATION Right 02/24/2018   GREAT TOE AND 2ND TOE AMPUTATION  . TUBAL LIGATION  1970's   Current Outpatient Medications on File Prior to Visit  Medication Sig Dispense Refill  . acetaminophen (TYLENOL) 500 MG tablet Take 1,000 mg by mouth every 6 (six) hours as needed for moderate pain or headache.    . albuterol (VENTOLIN HFA) 108 (90 Base) MCG/ACT inhaler TAKE 2 PUFFS BY MOUTH EVERY 6 HOURS AS NEEDED FOR  WHEEZE OR SHORTNESS OF BREATH (Patient taking differently: Inhale 2 puffs into the lungs every 6 (six) hours as needed for wheezing or shortness of breath.) 8.5 g 1  . allopurinol (ZYLOPRIM) 100 MG tablet TAKE 1 TABLET BY MOUTH TWICE A DAY (Patient taking differently: Take 100 mg by mouth 2 (two) times daily.) 180 tablet 4  . Ascorbic Acid (VITAMIN C) 1000 MG tablet Take 1,000 mg by mouth daily.    Marland Kitchen aspirin EC 81 MG tablet Take 1 tablet (81 mg total) by mouth daily with breakfast. 30 tablet 11  . Bismuth Tribromoph-Petrolatum (XEROFORM PETROLAT GAUZE 5"X9") MISC Apply 1 each topically 2 (two) times daily. For dressing changes 50 each 1  . carvedilol (COREG) 12.5 MG tablet TAKE 1 TABLET (12.5 MG TOTAL) BY MOUTH 2 (TWO) TIMES DAILY WITH A MEAL. 180 tablet 1  . cholecalciferol (VITAMIN D) 1000 units tablet Take 1,000 Units by mouth daily with supper.     . clopidogrel (PLAVIX) 75 MG tablet TAKE 1 TABLET BY MOUTH DAILY WITH BREAKFAST. 90 tablet 3  . ferrous sulfate 325 (65 FE) MG tablet Take 1 tablet (325 mg total) by mouth 2 (two) times daily with a meal. Reported on 08/21/2015 60 tablet 2  . FLUoxetine (PROZAC) 20 MG capsule TAKE 1 CAPSULE BY MOUTH EVERY DAY 90 capsule 2  . guaiFENesin-dextromethorphan (ROBITUSSIN DM) 100-10 MG/5ML syrup Take 10 mLs by mouth every 4 (four) hours as needed for cough. 118 mL 0  . hydrocerin (EUCERIN) CREA Apply 1 application topically 2 (two) times daily. 113 g 0  . Insulin Disposable Pump (OMNIPOD DASH 5 PACK PODS) MISC Inject into the skin See admin instructions. Use continuously with Novolin R - change every 72 hours.    . insulin NPH Human (HUMULIN N,NOVOLIN N) 100 UNIT/ML injection Inject 15 Units into the skin See admin instructions. Inject 15 units subcutaneously in the morning if CBG >300;    . insulin regular (NOVOLIN R) 100 units/mL injection Inject into the skin See admin instructions. Manually add bolus to continuous dose via OmniPod 3 times daily per sliding  scale (CBG 80-150 7 units, 151-200 9 units, 201-250 12 units, 251-300 14 units, 301- 400 17 units)    . levothyroxine (SYNTHROID, LEVOTHROID) 50 MCG tablet Take 1 tablet (50 mcg total) by mouth daily before breakfast. 90 tablet 1  . magnesium oxide (MAG-OX) 400 MG tablet TAKE 1 TABLET BY MOUTH EVERY DAY (Patient taking differently: Take 400 mg by mouth daily.) 120 tablet 2  . miconazole (MICOTIN) 2 % powder Apply topically as needed for itching. 70 g 0  . Multiple Vitamins-Minerals (AIRBORNE GUMMIES PO) Take 2 tablets by mouth every evening.    . Multiple Vitamins-Minerals (CENTRUM SILVER 50+WOMEN) TABS Take 1 tablet by mouth every evening.    . Multiple Vitamins-Minerals (ZINC PO) Take 1 tablet by mouth every evening.    . nitroGLYCERIN (NITROSTAT) 0.4 MG SL tablet Place 1 tablet (0.4 mg  total) under the tongue every 5 (five) minutes as needed for chest pain. 25 tablet 1  . ONETOUCH VERIO test strip 3 (three) times daily.    . pantoprazole (PROTONIX) 40 MG tablet Take 40 mg by mouth daily.    . polyethylene glycol (MIRALAX / GLYCOLAX) 17 g packet Take 17 g by mouth daily. 14 each 0  . pregabalin (LYRICA) 150 MG capsule Take 1 capsule (150 mg total) by mouth 2 (two) times daily. 60 capsule 2  . sulfamethoxazole-trimethoprim (BACTRIM) 400-80 MG tablet Take 1 tablet by mouth 2 (two) times daily. 14 tablet 0  . sulfamethoxazole-trimethoprim (BACTRIM) 400-80 MG tablet Take 1 tablet by mouth daily. To prevent uti 30 tablet 3  . torsemide (DEMADEX) 20 MG tablet Take 4 tablets (80 mg total) by mouth in the morning. (Patient taking differently: Take by mouth.) 180 tablet 3  . traZODone (DESYREL) 100 MG tablet TAKE 1 AND 1/2 TABLETS BY MOUTH AT BEDTIME AS NEEDED FOR SLEEP. (Patient taking differently: Take 200 mg by mouth at bedtime.) 135 tablet 1  . ursodiol (ACTIGALL) 500 MG tablet Take 500 mg by mouth 3 (three) times daily.     No current facility-administered medications on file prior to visit.    Allergies  Allergen Reactions  . Drug Ingredient [Cephalexin] Diarrhea    Pt reports heavy diarrhea with use of keflex  . Codeine Nausea And Vomiting   Social History   Socioeconomic History  . Marital status: Married    Spouse name: Not on file  . Number of children: 3  . Years of education: 12th  . Highest education level: Not on file  Occupational History    Employer: UNEMPLOYED  Tobacco Use  . Smoking status: Former Smoker    Packs/day: 3.00    Years: 32.00    Pack years: 96.00    Types: Cigarettes    Quit date: 10/24/1997    Years since quitting: 22.9  . Smokeless tobacco: Never Used  Vaping Use  . Vaping Use: Never used  Substance and Sexual Activity  . Alcohol use: Not Currently    Comment: "might have 2-3 daiquiris in the summer"  . Drug use: No  . Sexual activity: Not Currently  Other Topics Concern  . Not on file  Social History Narrative   Pt lives at home with her spouse.   Caffeine Use- 3 sodas daily   Social Determinants of Health   Financial Resource Strain: Low Risk   . Difficulty of Paying Living Expenses: Not very hard  Food Insecurity: No Food Insecurity  . Worried About Charity fundraiser in the Last Year: Never true  . Ran Out of Food in the Last Year: Never true  Transportation Needs: No Transportation Needs  . Lack of Transportation (Medical): No  . Lack of Transportation (Non-Medical): No  Physical Activity: Not on file  Stress: Not on file  Social Connections: Not on file  Intimate Partner Violence: Not on file     Review of Systems  All other systems reviewed and are negative.      Objective:   Physical Exam Vitals reviewed.  Constitutional:      General: She is not in acute distress.    Appearance: She is obese. She is not ill-appearing, toxic-appearing or diaphoretic.  Cardiovascular:     Rate and Rhythm: Normal rate and regular rhythm.     Heart sounds: Normal heart sounds. No murmur heard. No gallop.   Pulmonary:  Effort: Pulmonary effort is normal. No respiratory distress.     Breath sounds: No stridor. No wheezing or rhonchi.  Abdominal:     General: Bowel sounds are normal. There is no distension.     Palpations: Abdomen is soft.  Musculoskeletal:     Right lower leg: No edema.     Left lower leg: No edema.  Neurological:     Mental Status: She is alert.           Assessment & Plan:  Stage 3b chronic kidney disease ([place]) - Plan: BASIC METABOLIC PANEL WITH GFR  Patient does not appear fluid overloaded today.  Therefore I see no cause to increase her diuretic at this point.  However I did have a long discussion with her about proper dietary choices.  I explained the association between sodium and fluid retention.  The patient must avoid sodium rich foods such as chips, Fritos, canned foods.  I explained to the patient that when she eats these things, she is going to gain water weight rapidly.  When she does we will have to increase her diuretics which only cause increased issues with her renal function.  Therefore I strongly encourage the patient to eat a low-sodium diet to help preserve her kidney function is much as possible.  Check BMP today while the patient is here and if creatinine is stable I will plan to follow-up with the patient in 1 month.

## 2020-10-08 NOTE — Telephone Encounter (Signed)
NTBS.

## 2020-10-08 NOTE — Telephone Encounter (Signed)
Call placed to patient and patient made aware.   Appointment scheduled.   Of note, patient states that she is back to 252.7 today.

## 2020-10-09 LAB — BASIC METABOLIC PANEL WITH GFR
BUN/Creatinine Ratio: 25 (calc) — ABNORMAL HIGH (ref 6–22)
BUN: 85 mg/dL — ABNORMAL HIGH (ref 7–25)
CO2: 28 mmol/L (ref 20–32)
Calcium: 9.5 mg/dL (ref 8.6–10.4)
Chloride: 95 mmol/L — ABNORMAL LOW (ref 98–110)
Creat: 3.36 mg/dL — ABNORMAL HIGH (ref 0.60–0.93)
GFR, Est African American: 15 mL/min/{1.73_m2} — ABNORMAL LOW (ref >=60)
GFR, Est Non African American: 13 mL/min/{1.73_m2} — ABNORMAL LOW (ref >=60)
Glucose, Bld: 249 mg/dL — ABNORMAL HIGH (ref 65–99)
Potassium: 4.8 mmol/L (ref 3.5–5.3)
Sodium: 136 mmol/L (ref 135–146)

## 2020-10-11 ENCOUNTER — Encounter (HOSPITAL_COMMUNITY): Payer: HMO

## 2020-10-14 ENCOUNTER — Other Ambulatory Visit (HOSPITAL_COMMUNITY): Payer: Self-pay | Admitting: Cardiology

## 2020-10-15 ENCOUNTER — Telehealth (HOSPITAL_COMMUNITY): Payer: Self-pay

## 2020-10-15 DIAGNOSIS — I129 Hypertensive chronic kidney disease with stage 1 through stage 4 chronic kidney disease, or unspecified chronic kidney disease: Secondary | ICD-10-CM | POA: Diagnosis not present

## 2020-10-15 DIAGNOSIS — I509 Heart failure, unspecified: Secondary | ICD-10-CM | POA: Diagnosis not present

## 2020-10-15 DIAGNOSIS — E1122 Type 2 diabetes mellitus with diabetic chronic kidney disease: Secondary | ICD-10-CM | POA: Diagnosis not present

## 2020-10-15 DIAGNOSIS — N184 Chronic kidney disease, stage 4 (severe): Secondary | ICD-10-CM | POA: Diagnosis not present

## 2020-10-15 DIAGNOSIS — I5032 Chronic diastolic (congestive) heart failure: Secondary | ICD-10-CM | POA: Diagnosis not present

## 2020-10-15 DIAGNOSIS — N390 Urinary tract infection, site not specified: Secondary | ICD-10-CM | POA: Diagnosis not present

## 2020-10-16 ENCOUNTER — Telehealth (HOSPITAL_COMMUNITY): Payer: Self-pay

## 2020-10-16 NOTE — Telephone Encounter (Signed)
Called pt on her home and cell again and no answer to either. Will try again.   Marylouise Stacks, EMT-Paramedic  10/16/20'

## 2020-10-16 NOTE — Telephone Encounter (Signed)
Called pt on home line and cell and no answer.  Will try again. I did LVM on her home phone to call me back. She has reported in the past she doesn't know how to check her VM on her cell phone so I didn't leave message on there.   Marylouise Stacks, EMT-Paramedic 10/15/2020

## 2020-10-23 ENCOUNTER — Telehealth (HOSPITAL_COMMUNITY): Payer: Self-pay

## 2020-10-23 ENCOUNTER — Telehealth: Payer: Self-pay | Admitting: *Deleted

## 2020-10-23 NOTE — Telephone Encounter (Signed)
Call placed to patient to inquire.   Weights reported as following:  Weight lbs  22-May 255.4  23-May 255.9  24-May 253.7  25-May 253.9  26-May 255.7  27-May 254.9  28-May 258.3  29-May 257  30-May 258.6  31-May 257.4   Patient reports that she is taking Toresemide 80mg  PO BID. Reports that ankles and feet have little to no swelling at this time. Patient does state that she ws more liberal with her salt intake recently.   Also reports that she has stopped taking Bactrim for UTI prevention as recommended by nephrology. Inquired if there is another option that will not harm her kidney function.   Please advise.

## 2020-10-23 NOTE — Telephone Encounter (Signed)
Try to reduce salt rather than take more fluid pills.

## 2020-10-23 NOTE — Telephone Encounter (Signed)
I finally got in touch with pt. She reports last week was a bad week, she was busy getting things for her sons birthday and then she had a tooth pulled and is finally feeling better after that.  She denies increased sob, no dizziness.  She reports eating at a cookout twice over the wknd so her salt intake im sure exceeded her limit.  Her weight today was 257lbs, yesterday it was 254lbs.  She also reported that her kidney doctor from [facility], dr Hollie Salk? Told her that the daily antibiotic is causing her kidney numbers to elevate and she needs to stop taking it. She has stopped it but feels she needs something to help prevent UTI's. I did send message the nurse to her PCP office regarding her weights and the antibiotic.  She also wasn't sure if she needed to continue to call in the weights, I couldn't find it where he said to stop but I could have overlooked it but its been a few wks since she has done that.  I think getting her to call in on mon/wed/fri is good accountability for her and is helping manage her CHF.   Will f/u next week.   Susan [name], EMT-Paramedic  10/23/20

## 2020-10-23 NOTE — Telephone Encounter (Signed)
Call placed to patient and patient made aware.  

## 2020-10-23 NOTE — Telephone Encounter (Signed)
-----   Message from Marylouise Stacks, EMT sent at 10/23/2020 10:55 AM EDT ----- Susan [name], I finally got a hold of her today. I tried numerous times last week without success.  She reports last week was a bad week for her-she had a tooth pulled, feeling somewhat better now.  Her weight today was 257 and yesterday it was 254.  She also reported that her kidney doctor, I believe she said Dr. Hollie Salk from [facility], told her that daily antibiotic she is taking to prevent UTI's is making her kidney function elevated and wanted her to stop it.  Can you relay this to her provider there, she isnt sure what to do-she doesn't want to worsen her kidneys but then doesn't want UTI's either, maybe theres another one she can be on instead?  Also any further instructions for her weight increase of 3lbs overnight? She went to 2 cookouts over the wknd so her sodium consumption was definitely  increased for sure.    Thank you,  Marylouise Stacks, EMT-Paramedic  10/23/20

## 2020-11-01 ENCOUNTER — Other Ambulatory Visit: Payer: Self-pay

## 2020-11-01 ENCOUNTER — Encounter (HOSPITAL_COMMUNITY): Payer: Self-pay

## 2020-11-01 ENCOUNTER — Ambulatory Visit (HOSPITAL_COMMUNITY)
Admission: RE | Admit: 2020-11-01 | Discharge: 2020-11-01 | Disposition: A | Discharge status: Home / Self Care | Payer: HMO | Source: Ambulatory Visit | Attending: Cardiology | Admitting: Cardiology

## 2020-11-01 ENCOUNTER — Other Ambulatory Visit (HOSPITAL_COMMUNITY): Payer: Self-pay

## 2020-11-01 VITALS — BP 108/68 | HR 89 | Wt 269.0 lb

## 2020-11-01 DIAGNOSIS — E785 Hyperlipidemia, unspecified: Secondary | ICD-10-CM | POA: Insufficient documentation

## 2020-11-01 DIAGNOSIS — Z6841 Body Mass Index (BMI) 40.0 and over, adult: Secondary | ICD-10-CM | POA: Insufficient documentation

## 2020-11-01 DIAGNOSIS — I252 Old myocardial infarction: Secondary | ICD-10-CM | POA: Insufficient documentation

## 2020-11-01 DIAGNOSIS — E039 Hypothyroidism, unspecified: Secondary | ICD-10-CM | POA: Insufficient documentation

## 2020-11-01 DIAGNOSIS — Z794 Long term (current) use of insulin: Secondary | ICD-10-CM | POA: Insufficient documentation

## 2020-11-01 DIAGNOSIS — N184 Chronic kidney disease, stage 4 (severe): Secondary | ICD-10-CM | POA: Insufficient documentation

## 2020-11-01 DIAGNOSIS — I5032 Chronic diastolic (congestive) heart failure: Secondary | ICD-10-CM | POA: Insufficient documentation

## 2020-11-01 DIAGNOSIS — E1165 Type 2 diabetes mellitus with hyperglycemia: Secondary | ICD-10-CM | POA: Insufficient documentation

## 2020-11-01 DIAGNOSIS — I130 Hypertensive heart and chronic kidney disease with heart failure and stage 1 through stage 4 chronic kidney disease, or unspecified chronic kidney disease: Secondary | ICD-10-CM | POA: Insufficient documentation

## 2020-11-01 DIAGNOSIS — Z87440 Personal history of urinary (tract) infections: Secondary | ICD-10-CM | POA: Insufficient documentation

## 2020-11-01 DIAGNOSIS — R5383 Other fatigue: Secondary | ICD-10-CM | POA: Diagnosis not present

## 2020-11-01 DIAGNOSIS — Z8249 Family history of ischemic heart disease and other diseases of the circulatory system: Secondary | ICD-10-CM | POA: Diagnosis not present

## 2020-11-01 DIAGNOSIS — I2510 Atherosclerotic heart disease of native coronary artery without angina pectoris: Secondary | ICD-10-CM | POA: Diagnosis not present

## 2020-11-01 DIAGNOSIS — Z9641 Presence of insulin pump (external) (internal): Secondary | ICD-10-CM | POA: Diagnosis not present

## 2020-11-01 DIAGNOSIS — Z7902 Long term (current) use of antithrombotics/antiplatelets: Secondary | ICD-10-CM | POA: Diagnosis not present

## 2020-11-01 DIAGNOSIS — E1142 Type 2 diabetes mellitus with diabetic polyneuropathy: Secondary | ICD-10-CM | POA: Insufficient documentation

## 2020-11-01 DIAGNOSIS — E1122 Type 2 diabetes mellitus with diabetic chronic kidney disease: Secondary | ICD-10-CM | POA: Diagnosis not present

## 2020-11-01 DIAGNOSIS — Z87891 Personal history of nicotine dependence: Secondary | ICD-10-CM | POA: Insufficient documentation

## 2020-11-01 LAB — BASIC METABOLIC PANEL
Anion gap: 13 (ref 5–15)
BUN: 90 mg/dL — ABNORMAL HIGH (ref 8–23)
CO2: 26 mmol/L (ref 22–32)
Calcium: 8.6 mg/dL — ABNORMAL LOW (ref 8.9–10.3)
Chloride: 98 mmol/L (ref 98–111)
Creatinine, Ser: 3.82 mg/dL — ABNORMAL HIGH (ref 0.44–1.00)
GFR, Estimated: 12 mL/min — ABNORMAL LOW (ref >60)
Glucose, Bld: 112 mg/dL — ABNORMAL HIGH (ref 70–99)
Potassium: 4.1 mmol/L (ref 3.5–5.1)
Sodium: 137 mmol/L (ref 135–145)

## 2020-11-01 MED ORDER — TORSEMIDE 20 MG PO TABS: ORAL_TABLET | ORAL | 11 refills | Status: DC

## 2020-11-01 NOTE — Progress Notes (Signed)
Paramedicine Encounter   Patient ID: Susan [name] , female,   DOB: [birth year],70 y.o.,  MRN: [id number]   Met patient in clinic today with provider.   Weight @ clinic-269 Weight @ home-262 Weight @ home yesterday-260 B/p-108/68 P-89 Sp02-92  Pt here today, she is struggling with walking-she reports knee pain today. Her weight is up 10lbs from her home scales.  12lbs from the clinic scales. She reports things have been more stressful at home and she stress eats and has been eating a lot lately.  CBG's have been all over the place too.  She has fallen off from calling her PCP on m/w/f for weight reports.  Heart clinic will request records from her kidney docs.  Celedonio Miyamoto is increasing her torsemide to 148m in the morning and keeping the 827min the afternoon.  Labs today/labs again in 7 days.   KaMarylouise StacksEMAmberley/01/2021

## 2020-11-01 NOTE — Progress Notes (Signed)
Advanced Heart Failure Clinic Note   Primary Physician [name], MD HF Cardiologist: Glori Bickers, MD  Nephrology: Dr Hollie Salk  Reason for Visit:  F/u for Chronic Diastolic Heart Failure  HPI:  Susan [name] is a 70 y.o. female with a history of RBBB, DM, Hypothyroidish, diastolic heart failure, CAD MI 2000/2007 BMS 2007, TIA, obesity, CKD Stage IV and  Rt transmetatarsal amputation.    Admitted June 1st through June 3rd, 2017 with altered mental status, hypothermia, and weakness. Diuretics initially held and later restarted.    Admitted 2/27-07/28/17 for AKI. Torsemide was held and restarted at 60 mg BID. She had RHC 07/24/17 that showed mild PAH with evidence of RV strain likely due to OHS/OSA. She was discharged to SNF.    Admitted 07/5595 with A/C diastolic HF. Diuresed with IV lasix.    Admitted 10/24-10/27/19 with syncope. She was orthostatic. Had AKI with creatinine up to 3.33 and required IVF. Renal US showed no hydronephrosis. Losartan and Imdur were DC'd. Creatinine 1.76 on day of DC.   Was admitted 1/21 for confusion and abdominal pain. Found to have Klebsiella pneumoniae UTI treated w/ abx. Also with elevated LFTs. CT of abdomen showed cholelithiasis. MRCP showed cholelithiasis without evidence of cholecystitis or choledocholithiasis. Abdominal US with doppler showed no acute finding.  No concern for cholangitis. All viral serologies negative. Her Crestor was discontinued. She had outpatient GI f/u and continued work-up including a liver biopsy 08/11/19. No definite pathologic fibrosis noted in pathology report.   Admitted to [facility] 05/17/20 with N/V/D in the setting of possible gastroparesis.    Admitted to [facility] 05/24/20 with and A/C diastolic heart failure. Diuresed with IV lasix and transitioned to torsemide 80 mg twice a day.   She returned for HF follow up 1/22. Stressed out about taking care of her disabled son. SOB with exertion. + Orthopnea. Eating fast food. Not  weighing at home. Taking all medications. Lives with her husband and disabled son. She was instructed to increase torsemide to 100 mg bid x 2 days, then back down to 80 mg bid.  Had return f/u again on 2/11 and was still taking 100 of torsemide bid and had not reduced back down to 80 bid as previously directed. Labs showed AKI, w/ SCr rising from baseline of 1.9>>2.4. Was advised to reduce torsemide to 80 mg once daily x 3 days, then return to 80 mg bid.   Had home sleep study 07/25/20 and this was negative for OSA.   08/14/20, she was admitted to [name] with AKI and progressive volume overloaded in setting of recurrent UTI and probable ATN. SCr rose to 4.1. She was placed on IV abx and IV lasix w/ improved renal function and volume status. SCr was down to 1.78 day of d/c. PT had recommended SNF at d/c however pt declined. She was discharged home on 3/27 but readmitted 2 days later for urinary retention and recurrent AKI. SCr had increased back up to 3.1. UA showed persistent UTI. She was placed back on IV abx.  CT without hydronephrosis. SCr improved w/ tx of UTI. SCr back down to 1.7 on day of d/c and she was started back on PO torsemide but discharged home on 80 mg once daily (previous dose was 80 bid). She was discharged home on 4/4.    I saw her back for post hospital f/u on 4/12 and she was milady fluid overloaded. Wt was up 7 lb from d/c wt and she reported dysuria.  UA + for UTI. UC + for Enterobacter aerogenes. I started on her on 10 day course of Keflex and increased torsemide to 80 mg qhs/ 40 mg qpm. I advised she f/u w/ PCP for further management of recurrent UTIs for potential long term suppressive abx therapy.   She returns today for f/u for her HF. Here w/ paramedicine. BP well controlled 108/68 but wt up 12 lb, 257>>269 lb.Compliant w/ torsemide but still noncompliant w/ low sodium diet. Eating high sodium foods and not fluid restricting. Currently on 80 bid torsemide. No profound UOP during the  day. Increases at night. HYHA Class III, stable.   Per paramedic, BPs at home run 110s-140s.   She denies any active dysuria.    Cardiac Studies: Echo (9/21): EF 55-60%, RV ok Echo (10/19): EF 55-60%, Grade I DD  RHC (07/24/17): showed mild PAH with evidence of RV strain likely due to OHS/OSA.  Findings: RA = 17 RV = 48/15 PA = 49/11 (30) PCW = 18 Fick cardiac output/index = 7.0/3.0 PVR = 0.5 WU Ao sat = 97% PA sat = 62%, 64% Assessment: 1. Normal left-sided pressures 2. Mild PAH with evidence of RV strain likely due to OHS/OSA 3. Normal cardiac output  Lexiscan w/ stress echo (2018): EF 68%, low risk  LHC (2007): BMS distal RCA Cardiolite (03/2002): EF of 51% but no ischemia LHC (2000): BMS RCA   ROS: All systems reviewed and negative except as per HPI.   No outpatient medications have been marked as taking for the 11/01/20 encounter [facility] Encounter) with [facility] PA/NP.   Allergies  Allergen Reactions   Drug Ingredient [Cephalexin] Diarrhea    Pt reports heavy diarrhea with use of keflex   Codeine Nausea And Vomiting   Past Medical History:  Diagnosis Date   Acute MI ([place]) 1999; 2007   Anemia    hx   Anginal pain (HCC)    Anxiety    ARF (acute renal failure) ([place]) 06/2017   [facility]   Arthritis    "generalized" (03/15/2014)   CAD (coronary artery disease)    MI in 2000 - MI  2007 - treated bare metal stent (no nuclear since then as 9/11)   Carotid artery disease (HCC)    CHF (congestive heart failure) ([place])    Chronic diastolic heart failure ([place])    a) ECHO (08/2013) EF 55-60% and RV function nl b) RHC (08/2013) RA 4, RV 30/5/7, PA 25/10 (16), PCWP 7, Fick CO/CI 6.3/2.7, PVR 1.5 WU, PA 61 and 66%   Daily headache    "~ every other day; since I fell in June" (03/15/2014)   Depression    Dyslipidemia    Dyspnea    Exertional shortness of breath    HTN (hypertension)    Hypothyroidism    Neuropathy    Obesity    Osteoarthritis     Peripheral neuropathy    PONV (postoperative nausea and vomiting)    RBBB (right bundle branch block)    Old   Stroke ([place])    mini strokes   Syncope    likely due to low blood sugar   Tachycardia    Sinus tachycardia   Type II diabetes mellitus (HCC)    Type II   Urinary incontinence    Venous insufficiency    Family History  Problem Relation Age of Onset   Heart attack Mother 69   Past Surgical History:  Procedure Laterality Date   ABDOMINAL HYSTERECTOMY  1980's  AMPUTATION Right 02/24/2018   Procedure: RIGHT FOOT GREAT TOE AND 2ND TOE AMPUTATION;  Surgeon: Newt Minion, MD;  Location: [place];  Service: Orthopedics;  Laterality: Right;   AMPUTATION Right 04/30/2018   Procedure: RIGHT TRANSMETATARSAL AMPUTATION;  Surgeon: Newt Minion, MD;  Location: [place];  Service: Orthopedics;  Laterality: Right;   BIOPSY  05/27/2020   Procedure: BIOPSY;  Surgeon: [name], DO;  [facility];  Service: Endoscopy;;   CATARACT EXTRACTION, BILATERAL Bilateral ?2013   COLONOSCOPY W/ POLYPECTOMY     COLONOSCOPY WITH PROPOFOL N/A 03/13/2019   Procedure: COLONOSCOPY WITH PROPOFOL;  Surgeon: Jerene Bears, MD;  Location: [place];  Service: Gastroenterology;  Laterality: N/A;   [place]; 2007   "1 + 1"   ESOPHAGOGASTRODUODENOSCOPY (EGD) WITH PROPOFOL N/A 03/13/2019   Procedure: ESOPHAGOGASTRODUODENOSCOPY (EGD) WITH PROPOFOL;  Surgeon: Jerene Bears, MD;  Location: [place];  Service: Gastroenterology;  Laterality: N/A;   ESOPHAGOGASTRODUODENOSCOPY (EGD) WITH PROPOFOL N/A 05/27/2020   Procedure: ESOPHAGOGASTRODUODENOSCOPY (EGD) WITH PROPOFOL;  Surgeon: [name], DO;  [facility];  Service: Endoscopy;  Laterality: N/A;   EYE SURGERY Bilateral    lazer   HEMOSTASIS CLIP PLACEMENT  03/13/2019   Procedure: HEMOSTASIS CLIP PLACEMENT;  Surgeon: Jerene Bears, MD;  Location: [facility];  Service: Gastroenterology;;    KNEE ARTHROSCOPY Left 10/25/2006   POLYPECTOMY  03/13/2019   Procedure: POLYPECTOMY;  Surgeon: Jerene Bears, MD;  Location: [place];  Service: Gastroenterology;;   RIGHT HEART CATH N/A 07/24/2017   Procedure: RIGHT HEART CATH;  Surgeon: Jolaine Artist, MD;  Location: [place] [facility];  Service: Cardiovascular;  Laterality: N/A;   RIGHT HEART CATHETERIZATION N/A 09/22/2013   Procedure: RIGHT HEART CATH;  Surgeon: Jolaine Artist, MD;  Location: [facility] C[facility] LAB;  Service: Cardiovascular;  Laterality: N/A;   SHOULDER ARTHROSCOPY WITH OPEN ROTATOR CUFF REPAIR Right 03/14/2014   Procedure: RIGHT SHOULDER ARTHROSCOPY WITH BICEPS RELEASE, OPEN SUBSCAPULA REPAIR, OPEN SUPRASPINATUS REPAIR.;  Surgeon: Meredith Pel, MD;  Location: [place];  Service: Orthopedics;  Laterality: Right;   TOE AMPUTATION Right 02/24/2018   GREAT TOE AND 2ND TOE AMPUTATION   TUBAL LIGATION  1970's   Social History   Socioeconomic History   Marital status: Married    Spouse name: Not on file   Number of children: 3   Years of education: 12th   Highest education level: Not on file  Occupational History    Employer: UNEMPLOYED  Tobacco Use   Smoking status: Former    Packs/day: 3.00    Years: 32.00    Pack years: 96.00    Types: Cigarettes    Quit date: 10/24/1997    Years since quitting: 23.0   Smokeless tobacco: Never  Vaping Use   Vaping Use: Never used  Substance and Sexual Activity   Alcohol use: Not Currently    Comment: "might have 2-3 daiquiris in the summer"   Drug use: No   Sexual activity: Not Currently  Other Topics Concern   Not on file  Social History Narrative   Pt lives at home with her spouse.   Caffeine Use- 3 sodas daily   Social Determinants of Health   Financial Resource Strain: Low Risk    Difficulty of Paying Living Expenses: Not very hard  Food Insecurity: No Food Insecurity   Worried About Running Out of Food in the Last Year: Never true  Ran Out of Food in the Last  Year: Never true  Transportation Needs: No Transportation Needs   Lack of Transportation (Medical): No   Lack of Transportation (Non-Medical): No  Physical Activity: Not on file  Stress: Not on file  Social Connections: Not on file  Intimate Partner Violence: Not on file   Lipid Panel     Component Value Date/Time   CHOL 242 (H) 12/21/2019 1550   TRIG 125 03/07/2020 1808   HDL 52 12/21/2019 1550   CHOLHDL 4.7 12/21/2019 1550   VLDL 60 (H) 12/21/2019 1550   LDLCALC 130 (H) 12/21/2019 1550   LDLCALC 65 01/17/2019 1608    Blood pressure 108/68, pulse 89, weight 122 kg (269 lb), SpO2 92 %.  Wt Readings from Last 3 Encounters:  11/01/20 122 kg (269 lb)  10/08/20 116.7 kg (257 lb 3.2 oz)  10/01/20 114.4 kg (252 lb 1.6 oz)    Today's Vitals   11/01/20 1433  BP: 108/68  Pulse: 89  SpO2: 92%  Weight: 122 kg (269 lb)   Body mass index is 43.42 kg/m.  PHYSICAL EXAM: General:  Well appearing, obese No respiratory difficulty HEENT: normal Neck: supple. JVD 9 cm Carotids 2+ bilat; no bruits. No lymphadenopathy or thyromegaly appreciated. Cor: PMI nondisplaced. Regular rate & rhythm. No rubs, gallops or murmurs. Lungs: clear Abdomen: soft, nontender, nondistended. No hepatosplenomegaly. No bruits or masses. Good bowel sounds. Extremities: no cyanosis, clubbing, rash, 1+ bilateral LE edema Neuro: alert & oriented x 3, cranial nerves grossly intact. moves all 4 extremities w/o difficulty. Affect pleasant.    ASSESSMENT AND PLAN:   1. Chronic Diastolic Heart Failure:  - NICM, Echo 06/2017: EF 55-60%, grade 1 DD - Echo 03/19/18: EF 55-60%, grade 1 DD - Echo 01/2020 EF 55-60%. RV normal  - NYHA III, chronic confounded by obesity/ deconditioning.  Volume status elevated in the setting of dietary indiscretion. - Increase torsemide to 100 mg qhs/ 80 mg qpm.  - check BMP today and again in 7 days  - Continue carvedilol 12.5 mg bid. - c/w para medicine.  - needs to do better w/  sodium reduction and fluid restriction      2.  Uncontrolled DM:  - Improving, recent Hgb A1c was down to 8.7 (previously 11)  - Follows closely with Dr. Bubba Camp. Has an insulin pump.  3. CAD: history of CAD with BMS in 2007.  Low risk nuc study in 2018 - Stable w/o CP  - Continue DAPT w/ ASA + Plavix.  - Continue ? blocker therapy. - Off statin due to previously elevated LFTs.   4. Morbid Obesity: - Body mass index is 43.42 kg/m.   5. HTN:  - controlled on current regimen. SBP 108 today  - per paramedicine SBP at home visits run 110-140. Pt will start daily log and bring back to f/u visit. Aim to keep SBP 120-130 to help w/ renal perfusion, if low may need to scale back Coreg  - check BMP today   6. CKD Stage IV - Followed by Dr. Hollie Salk.  - Baseline creatinine ~1.5-1.9 - recent admits for AKI in setting of UTI (finished abx). SCr peaked to 4.1 but improved. New baseline now ~3.5 - repeat BMP today and again in 7 days w/ torsemide increase   7. Daytime fatigue - Completed sleep study. Dr Radford Pax intepreted study. No evidence of OSA   8.  S/p right transmetatarsal amputation - Followed by Dr Sharol Given.    9. Recurrent UTIs -  UTI 3/22 + 4/22 treated w/ ceftriaxone - UTI 5/22. UC + for Enterobacter aerogenes. Treated w/ Keflex. Symptoms resolved  - discussed hygiene  - defer need for chronic abx for suppressive therapy to PCP   F/u in 4 weeks Continue HF Paramedicine. Appreciate their assistance     Lyda Jester PA-C  11/01/2020 2:36 PM

## 2020-11-01 NOTE — Patient Instructions (Signed)
Labs done today. We will contact you only if your labs are abnormal.  INCREASE Torsemide to 100mg  (5 tablets) by mouth every morning and in 80mg  (4 tablets) by mouth every evening.   No other medication changes were made. Please continue all current medications as prescribed.  Your physician recommends that you schedule a follow-up appointment in: 1 week for a lab only appointment and in 4 weeks in our APP Clinic here in our office.    If you have any questions or concerns before your next appointment please send [name] a message through [place] or call our office at [phone].    TO LEAVE A MESSAGE FOR THE NURSE SELECT OPTION 2, PLEASE LEAVE A MESSAGE INCLUDING: YOUR NAME DATE OF BIRTH CALL BACK NUMBER REASON FOR CALL**this is important as we prioritize the call backs  YOU WILL RECEIVE A CALL BACK THE SAME DAY AS LONG AS YOU CALL BEFORE 4:00 PM   Do the following things EVERYDAY: Weigh yourself in the morning before breakfast. Write it down and keep it in a log. Take your medicines as prescribed Eat low salt foods--Limit salt (sodium) to 2000 mg per day.  Stay as active as you can everyday Limit all fluids for the day to less than 2 liters   At the [facility], you and your health needs are our priority. As part of our continuing mission to provide you with exceptional heart care, we have created designated Provider Care Teams. These Care Teams include your primary Cardiologist (physician) and Advanced Practice Providers (APPs- Physician Assistants and Nurse Practitioners) who all work together to provide you with the care you need, when you need it.   You may see any of the following providers on your designated Care Team at your next follow up: Dr Glori Bickers Dr Haynes Kerns, NP Lyda Jester, [place] Audry Riles, PharmD   Please be sure to bring in all your medications bottles to every appointment.

## 2020-11-02 NOTE — Telephone Encounter (Signed)
Noted  

## 2020-11-02 NOTE — Telephone Encounter (Signed)
Error

## 2020-11-08 ENCOUNTER — Ambulatory Visit: Payer: HMO | Admitting: Family Medicine

## 2020-11-08 ENCOUNTER — Other Ambulatory Visit (HOSPITAL_COMMUNITY): Payer: Self-pay

## 2020-11-08 ENCOUNTER — Other Ambulatory Visit: Payer: Self-pay

## 2020-11-08 ENCOUNTER — Ambulatory Visit (HOSPITAL_COMMUNITY)
Admission: RE | Admit: 2020-11-08 | Discharge: 2020-11-08 | Disposition: A | Discharge status: Home / Self Care | Payer: HMO | Source: Ambulatory Visit | Attending: Internal Medicine | Admitting: Internal Medicine

## 2020-11-08 DIAGNOSIS — I5032 Chronic diastolic (congestive) heart failure: Secondary | ICD-10-CM

## 2020-11-08 LAB — BASIC METABOLIC PANEL
Anion gap: 17 — ABNORMAL HIGH (ref 5–15)
BUN: 93 mg/dL — ABNORMAL HIGH (ref 8–23)
CO2: 24 mmol/L (ref 22–32)
Calcium: 9 mg/dL (ref 8.9–10.3)
Chloride: 91 mmol/L — ABNORMAL LOW (ref 98–111)
Creatinine, Ser: 3.52 mg/dL — ABNORMAL HIGH (ref 0.44–1.00)
GFR, Estimated: 13 mL/min — ABNORMAL LOW (ref >60)
Glucose, Bld: 478 mg/dL — ABNORMAL HIGH (ref 70–99)
Potassium: 4.5 mmol/L (ref 3.5–5.1)
Sodium: 132 mmol/L — ABNORMAL LOW (ref 135–145)

## 2020-11-08 LAB — BRAIN NATRIURETIC PEPTIDE: B Natriuretic Peptide: 443.2 pg/mL — ABNORMAL HIGH (ref 0.0–100.0)

## 2020-11-08 NOTE — Progress Notes (Signed)
Paramedicine Encounter    Patient ID: Susan [name], female    DOB: [birth year], 70 y.o.   MRN: [id number]   Patient Care Team: [name], MD as PCP - General (Family Medicine) Bensimhon, Shaune Pascal, MD as PCP - Cardiology (Cardiology) Jacelyn Pi, MD (Endocrinology) Hollie Salk Nevin Bloodgood, PA-C (Neurology) Newt Minion, MD as Consulting Physician (Orthopedic Surgery) Jorge Ny, LCSW as Social Worker (Licensed Clinical Social Worker)  Patient Active Problem List   Diagnosis Date Noted   Acute renal failure superimposed on stage 4 chronic kidney disease ([place]) 08/22/2020   Hypoalbuminemia 05/25/2020   GERD (gastroesophageal reflux disease) 05/25/2020   Pressure injury of skin 05/17/2020   Type 2 diabetes mellitus with diabetic polyneuropathy, with long-term current use of insulin ([place]) 03/07/2020   Obesity, Class III, BMI 40-49.9 (morbid obesity) ([place]) 03/07/2020   Common bile duct (CBD) obstruction 05/28/2019   Benign neoplasm of ascending colon    Benign neoplasm of transverse colon    Benign neoplasm of descending colon    Benign neoplasm of sigmoid colon    Gastric polyps    Hyperkalemia 03/11/2019   Prolonged QT interval 03/11/2019   Onychomycosis 06/21/2018   Osteomyelitis of second toe of right foot ([place])    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 (HCC)    PVD (peripheral vascular disease) ([place]) 10/26/2017   E-coli UTI 07/27/2017   Hypothyroidism 07/27/2017   PAH (pulmonary artery hypertension) ([place])    Impaired ambulation 07/19/2017   Leg cramps 02/27/2017   Peripheral edema 01/12/2017   Diabetic neuropathy ([place]) 11/12/2016   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 mellitus ([place]) 10/24/2015   Anemia 10/03/2015   Generalized anxiety disorder 10/03/2015   Insomnia 10/03/2015   Hyperglycemia due to diabetes mellitus ([place]) 06/07/2015   Chronic diastolic CHF (congestive heart failure) ([place]) 06/07/2015   Non compliance with medical treatment  04/17/2014   Rotator cuff tear 03/14/2014   Class 3 obesity 09/23/2013   Hypotension 12/25/2012   Urinary incontinence    MDD (major depressive disorder) 11/12/2010   RBBB (right bundle branch block)    Coronary artery disease involving native coronary artery of native heart without angina pectoris    Hyperlipemia 01/22/2009   Essential hypertension 01/22/2009    Current Outpatient Medications:    acetaminophen (TYLENOL) 500 MG tablet, Take 1,000 mg by mouth every 6 (six) hours as needed for moderate pain or headache., Disp: , Rfl:    albuterol (VENTOLIN HFA) 108 (90 Base) MCG/ACT inhaler, TAKE 2 PUFFS BY MOUTH EVERY 6 HOURS AS NEEDED FOR WHEEZE OR SHORTNESS OF BREATH (Patient taking differently: Inhale 2 puffs into the lungs every 6 (six) hours as needed for wheezing or shortness of breath.), Disp: 8.5 g, Rfl: 1   allopurinol (ZYLOPRIM) 100 MG tablet, TAKE 1 TABLET BY MOUTH TWICE A DAY (Patient taking differently: Take 100 mg by mouth 2 (two) times daily.), Disp: 180 tablet, Rfl: 4   Ascorbic Acid (VITAMIN C) 1000 MG tablet, Take 1,000 mg by mouth daily., Disp: , Rfl:    aspirin EC 81 MG tablet, Take 1 tablet (81 mg total) by mouth daily with breakfast., Disp: 30 tablet, Rfl: 11   Bismuth Tribromoph-Petrolatum (XEROFORM PETROLAT GAUZE 5"X9") MISC, Apply 1 each topically 2 (two) times daily. For dressing changes, Disp: 50 each, Rfl: 1   carvedilol (COREG) 12.5 MG tablet, TAKE 1 TABLET (12.5 MG TOTAL) BY MOUTH 2 (TWO) TIMES DAILY WITH A MEAL., Disp: 180 tablet, Rfl:  1   cholecalciferol (VITAMIN D) 1000 units tablet, Take 1,000 Units by mouth daily with supper. , Disp: , Rfl:    clopidogrel (PLAVIX) 75 MG tablet, TAKE 1 TABLET BY MOUTH DAILY WITH BREAKFAST., Disp: 90 tablet, Rfl: 3   ferrous sulfate 325 (65 FE) MG tablet, Take 1 tablet (325 mg total) by mouth 2 (two) times daily with a meal. Reported on 08/21/2015, Disp: 60 tablet, Rfl: 2   FLUoxetine (PROZAC) 20 MG capsule, TAKE 1 CAPSULE BY  MOUTH EVERY DAY, Disp: 90 capsule, Rfl: 2   Insulin Disposable Pump (OMNIPOD DASH 5 PACK PODS) MISC, Inject into the skin See admin instructions. Use continuously with Novolin R - change every 72 hours., Disp: , Rfl:    insulin NPH Human (HUMULIN N,NOVOLIN N) 100 UNIT/ML injection, Inject 15 Units into the skin See admin instructions. Inject 15 units subcutaneously in the morning if CBG >300;, Disp: , Rfl:    insulin regular (NOVOLIN R) 100 units/mL injection, Inject into the skin See admin instructions. Manually add bolus to continuous dose via OmniPod 3 times daily per sliding scale (CBG 80-150 7 units, 151-200 9 units, 201-250 12 units, 251-300 14 units, 301- 400 17 units), Disp: , Rfl:    levothyroxine (SYNTHROID, LEVOTHROID) 50 MCG tablet, Take 1 tablet (50 mcg total) by mouth daily before breakfast., Disp: 90 tablet, Rfl: 1   magnesium oxide (MAG-OX) 400 MG tablet, TAKE 1 TABLET BY MOUTH EVERY DAY (Patient taking differently: Take 400 mg by mouth daily.), Disp: 120 tablet, Rfl: 2   miconazole (MICOTIN) 2 % powder, Apply topically as needed for itching., Disp: 70 g, Rfl: 0   Multiple Vitamins-Minerals (AIRBORNE GUMMIES PO), Take 2 tablets by mouth every evening., Disp: , Rfl:    Multiple Vitamins-Minerals (CENTRUM SILVER 50+WOMEN) TABS, Take 1 tablet by mouth every evening., Disp: , Rfl:    Multiple Vitamins-Minerals (ZINC PO), Take 1 tablet by mouth every evening., Disp: , Rfl:    ONETOUCH VERIO test strip, 3 (three) times daily., Disp: , Rfl:    pantoprazole (PROTONIX) 40 MG tablet, Take 40 mg by mouth daily., Disp: , Rfl:    pregabalin (LYRICA) 150 MG capsule, Take 1 capsule (150 mg total) by mouth 2 (two) times daily., Disp: 60 capsule, Rfl: 2   torsemide (DEMADEX) 20 MG tablet, Take 5 tablets (100 mg total) by mouth in the morning AND 4 tablets (80 mg total) every evening., Disp: 270 tablet, Rfl: 11   ursodiol (ACTIGALL) 500 MG tablet, Take 500 mg by mouth 3 (three) times daily., Disp: , Rfl:     guaiFENesin-dextromethorphan (ROBITUSSIN DM) 100-10 MG/5ML syrup, Take 10 mLs by mouth every 4 (four) hours as needed for cough. (Patient not taking: Reported on 11/08/2020), Disp: 118 mL, Rfl: 0   hydrocerin (EUCERIN) CREA, Apply 1 application topically 2 (two) times daily. (Patient not taking: Reported on 11/08/2020), Disp: 113 g, Rfl: 0   nitroGLYCERIN (NITROSTAT) 0.4 MG SL tablet, Place 1 tablet (0.4 mg total) under the tongue every 5 (five) minutes as needed for chest pain. (Patient not taking: Reported on 11/08/2020), Disp: 25 tablet, Rfl: 1   polyethylene glycol (MIRALAX / GLYCOLAX) 17 g packet, Take 17 g by mouth daily. (Patient not taking: Reported on 11/08/2020), Disp: 14 each, Rfl: 0   traZODone (DESYREL) 100 MG tablet, TAKE 1 AND 1/2 TABLETS BY MOUTH AT BEDTIME AS NEEDED FOR SLEEP. (Patient not taking: Reported on 11/08/2020), Disp: 135 tablet, Rfl: 1 Allergies  Allergen Reactions  Drug Ingredient [Cephalexin] Diarrhea    Pt reports heavy diarrhea with use of keflex   Codeine Nausea And Vomiting      Social History   Socioeconomic History   Marital status: Married    Spouse name: Not on file   Number of children: 3   Years of education: 12th   Highest education level: Not on file  Occupational History    Employer: UNEMPLOYED  Tobacco Use   Smoking status: Former    Packs/day: 3.00    Years: 32.00    Pack years: 96.00    Types: Cigarettes    Quit date: 10/24/1997    Years since quitting: 23.0   Smokeless tobacco: Never  Vaping Use   Vaping Use: Never used  Substance and Sexual Activity   Alcohol use: Not Currently    Comment: "might have 2-3 daiquiris in the summer"   Drug use: No   Sexual activity: Not Currently  Other Topics Concern   Not on file  Social History Narrative   Pt lives at home with her spouse.   Caffeine Use- 3 sodas daily   Social Determinants of Health   Financial Resource Strain: Low Risk    Difficulty of Paying Living Expenses: Not very  hard  Food Insecurity: No Food Insecurity   Worried About Charity fundraiser in the Last Year: Never true   Ran Out of Food in the Last Year: Never true  Transportation Needs: No Transportation Needs   Lack of Transportation (Medical): No   Lack of Transportation (Non-Medical): No  Physical Activity: Not on file  Stress: Not on file  Social Connections: Not on file  Intimate Partner Violence: Not on file    Physical Exam      Future Appointments  Date Time Provider [place]  11/08/2020  2:45 PM [facility] LAB [facility] None  11/15/2020  2:30 PM [name], MD [facility] None  11/29/2020  3:00 PM [facility] PA/NP [facility] None    BP 132/64   Pulse 76   Resp 18   Wt 262 lb (118.8 kg)   SpO2 94%   BMI 42.29 kg/m  CBG PTA-149 Weight yesterday-261 Last visit weight-262   Pt reports same weight as last week despite the increase of torsemide in the morning.  She reports no BM past 3 days. No increased urine output. She is putting out more at night time than day time.  She feels same short of breath. Denies c/p, dizziness every once in a while-this past Tuesday she was more dizzy. But she reports her CBG was over 300 that day, so the dizziness may be connected to her CBG level.  Meds verified. She does her own pill box. Sent message to triage nurse regarding her weight despite torsemide increase.   Marylouise Stacks, [facility] Paramedic  11/08/20

## 2020-11-12 ENCOUNTER — Encounter: Payer: Self-pay | Admitting: Orthopedic Surgery

## 2020-11-12 ENCOUNTER — Ambulatory Visit: Payer: HMO | Admitting: Orthopedic Surgery

## 2020-11-12 ENCOUNTER — Other Ambulatory Visit: Payer: Self-pay

## 2020-11-12 DIAGNOSIS — Z89431 Acquired absence of right foot: Secondary | ICD-10-CM

## 2020-11-12 DIAGNOSIS — L97929 Non-pressure chronic ulcer of unspecified part of left lower leg with unspecified severity: Secondary | ICD-10-CM

## 2020-11-12 DIAGNOSIS — I87333 Chronic venous hypertension (idiopathic) with ulcer and inflammation of bilateral lower extremity: Secondary | ICD-10-CM

## 2020-11-12 DIAGNOSIS — L97919 Non-pressure chronic ulcer of unspecified part of right lower leg with unspecified severity: Secondary | ICD-10-CM | POA: Diagnosis not present

## 2020-11-12 DIAGNOSIS — B351 Tinea unguium: Secondary | ICD-10-CM

## 2020-11-12 NOTE — Progress Notes (Signed)
Office Visit Note   Patient: Susan [name]           Date of Birth: January 31, 1951           MRN: [id number] Visit Date: 11/12/2020              Requested by: [name], MD [address] [place],  [place] [address] PCP: [name], MD  Chief Complaint  Patient presents with   Left Foot - Nail Problem      HPI: Patient is a 70 year old woman who presents in follow-up for right transmetatarsal amputation venous stasis insufficiency both legs and onychomycotic nails on the left foot.  Assessment & Plan: Visit Diagnoses:  1. Idiopathic chronic venous hypertension of both lower extremities with ulcer and inflammation ([place])   2. History of transmetatarsal amputation of right foot ([place])   3. Onychomycosis     Plan: Nails were trimmed x5 without complication she will continue with compression stockings.  Follow-Up Instructions: Return in about 3 months (around 02/12/2021).   Ortho Exam  Patient is alert, oriented, no adenopathy, well-dressed, normal affect, normal respiratory effort. Examination patient is stable transmetatarsal amputation on the right the venous stasis swelling has decreased there are no open ulcers no drainage no cellulitis.  Patient has no plantar ulcers on the left foot she does have thickened discolored onychomycotic nails x5 that if she is unable to trim on her own.  Nails were trimmed x5 without complications.  Imaging: No results found. No images are attached to the encounter.  Labs: Lab Results  Component Value Date   HGBA1C 10.3 (H) 08/26/2020   HGBA1C 11.0 (H) 08/14/2020   HGBA1C 8.7 (H) 05/17/2020   CRP 0.7 08/22/2020   CRP 1.5 (H) 03/13/2020   CRP 1.9 (H) 03/12/2020   LABURIC 4.7 08/09/2020   LABURIC 9.1 (H) 10/12/2018   LABURIC 9.0 (H) 03/06/2018   REPTSTATUS 09/07/2020 FINAL 09/04/2020   CULT >=100,000 COLONIES/mL ENTEROBACTER AEROGENES (A) 09/04/2020   LABORGA ENTEROBACTER AEROGENES (A) 09/04/2020     Lab Results   Component Value Date   ALBUMIN 2.8 (L) 08/22/2020   ALBUMIN 3.2 (L) 08/21/2020   ALBUMIN 2.3 (L) 05/28/2020    Lab Results  Component Value Date   MG 2.2 08/22/2020   MG 1.9 08/19/2020   MG 1.8 08/18/2020   No results found for: VD25OH  No results found for: PREALBUMIN CBC EXTENDED Latest Ref Rng & Units 08/26/2020 08/25/2020 08/24/2020  WBC 4.0 - 10.5 K/uL 5.5 5.4 5.9  RBC 3.87 - 5.11 MIL/uL 4.48 4.19 3.95  HGB 12.0 - 15.0 g/dL 12.1 11.3(L) 10.4(L)  HCT 36.0 - 46.0 % 38.5 37.6 33.6(L)  PLT 150 - 400 K/uL 235 192 172  NEUTROABS 1.7 - 7.7 K/uL - - -  LYMPHSABS 0.7 - 4.0 K/uL - - -     There is no height or weight on file to calculate BMI.  Orders:  No orders of the defined types were placed in this encounter.  No orders of the defined types were placed in this encounter.    Procedures: No procedures performed  Clinical Data: No additional findings.  ROS:  All other systems negative, except as noted in the HPI. Review of Systems  Objective: Vital Signs: There were no vitals taken for this visit.  Specialty Comments:  No specialty comments available.  PMFS History: Patient Active Problem List   Diagnosis Date Noted   Acute renal failure superimposed on stage 4  chronic kidney disease ([place]) 08/22/2020   Hypoalbuminemia 05/25/2020   GERD (gastroesophageal reflux disease) 05/25/2020   Pressure injury of skin 05/17/2020   Type 2 diabetes mellitus with diabetic polyneuropathy, with long-term current use of insulin ([place]) 03/07/2020   Obesity, Class III, BMI 40-49.9 (morbid obesity) ([place]) 03/07/2020   Common bile duct (CBD) obstruction 05/28/2019   Benign neoplasm of ascending colon    Benign neoplasm of transverse colon    Benign neoplasm of descending colon    Benign neoplasm of sigmoid colon    Gastric polyps    Hyperkalemia 03/11/2019   Prolonged QT interval 03/11/2019   Onychomycosis 06/21/2018   Osteomyelitis of second toe of right foot ([place])    Venous  ulcer of both lower extremities with varicose veins (HCC)    PVD (peripheral vascular disease) ([place]) 10/26/2017   E-coli UTI 07/27/2017   Hypothyroidism 07/27/2017   PAH (pulmonary artery hypertension) (HCC)    Impaired ambulation 07/19/2017   Leg cramps 02/27/2017   Peripheral edema 01/12/2017   Diabetic neuropathy ([place]) 11/12/2016   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 mellitus ([place]) 10/24/2015   Anemia 10/03/2015   Generalized anxiety disorder 10/03/2015   Insomnia 10/03/2015   Hyperglycemia due to diabetes mellitus ([place]) 06/07/2015   Chronic diastolic CHF (congestive heart failure) ([place]) 06/07/2015   Non compliance with medical treatment 04/17/2014   Rotator cuff tear 03/14/2014   Class 3 obesity 09/23/2013   Hypotension 12/25/2012   Urinary incontinence    MDD (major depressive disorder) 11/12/2010   RBBB (right bundle branch block)    Coronary artery disease involving native coronary artery of native heart without angina pectoris    Hyperlipemia 01/22/2009   Essential hypertension 01/22/2009   Past Medical History:  Diagnosis Date   Acute MI ([place]) 1999; 2007   Anemia    hx   Anginal pain ([place])    Anxiety    ARF (acute renal failure) ([place]) 06/2017   [facility]   Arthritis    "generalized" (03/15/2014)   CAD (coronary artery disease)    MI in 2000 - MI  2007 - treated bare metal stent (no nuclear since then as 9/11)   Carotid artery disease (HCC)    CHF (congestive heart failure) ([place])    Chronic diastolic heart failure ([place])    a) ECHO (08/2013) EF 55-60% and RV function nl b) RHC (08/2013) RA 4, RV 30/5/7, PA 25/10 (16), PCWP 7, Fick CO/CI 6.3/2.7, PVR 1.5 WU, PA 61 and 66%   Daily headache    "~ every other day; since I fell in June" (03/15/2014)   Depression    Dyslipidemia    Dyspnea    Exertional shortness of breath    HTN (hypertension)    Hypothyroidism    Neuropathy    Obesity    Osteoarthritis    Peripheral neuropathy    PONV (postoperative  nausea and vomiting)    RBBB (right bundle branch block)    Old   Stroke ([place])    mini strokes   Syncope    likely due to low blood sugar   Tachycardia    Sinus tachycardia   Type II diabetes mellitus (HCC)    Type II   Urinary incontinence    Venous insufficiency     Family History  Problem Relation Age of Onset   Heart attack Mother 79    Past Surgical History:  Procedure Laterality Date   ABDOMINAL HYSTERECTOMY  1980's  AMPUTATION Right 02/24/2018   Procedure: RIGHT FOOT GREAT TOE AND 2ND TOE AMPUTATION;  Surgeon: Newt Minion, MD;  Location: [facility];  Service: Orthopedics;  Laterality: Right;   AMPUTATION Right 04/30/2018   Procedure: RIGHT TRANSMETATARSAL AMPUTATION;  Surgeon: Newt Minion, MD;  Location: [facility];  Service: Orthopedics;  Laterality: Right;   BIOPSY  05/27/2020   Procedure: BIOPSY;  Surgeon: [name], DO;  [facility];  Service: Endoscopy;;   CATARACT EXTRACTION, BILATERAL Bilateral ?2013   COLONOSCOPY W/ POLYPECTOMY     COLONOSCOPY WITH PROPOFOL N/A 03/13/2019   Procedure: COLONOSCOPY WITH PROPOFOL;  Surgeon: Jerene Bears, MD;  Location: [facility];  Service: Gastroenterology;  Laterality: N/A;   [place]; 2007   "1 + 1"   ESOPHAGOGASTRODUODENOSCOPY (EGD) WITH PROPOFOL N/A 03/13/2019   Procedure: ESOPHAGOGASTRODUODENOSCOPY (EGD) WITH PROPOFOL;  Surgeon: Jerene Bears, MD;  Location: [facility];  Service: Gastroenterology;  Laterality: N/A;   ESOPHAGOGASTRODUODENOSCOPY (EGD) WITH PROPOFOL N/A 05/27/2020   Procedure: ESOPHAGOGASTRODUODENOSCOPY (EGD) WITH PROPOFOL;  Surgeon: [name], DO;  [facility];  Service: Endoscopy;  Laterality: N/A;   EYE SURGERY Bilateral    [facility]   HEMOSTASIS CLIP PLACEMENT  03/13/2019   Procedure: HEMOSTASIS CLIP PLACEMENT;  Surgeon: Jerene Bears, MD;  Location[facility] ENDOSCOPY;  Service: Gastroenterology;;   KNEE ARTHROSCOPY Left 10/25/2006    POLYPECTOMY  03/13/2019   Procedure: POLYPECTOMY;  Surgeon: Jerene Bears, MD;  Location: [facility];  Service: Gastroenterology;;   RIGHT HEART CATH N/A 07/24/2017   Procedure: RIGHT HEART CATH;  Surgeon: Jolaine Artist, MD;  Location: [facility] LAB;  Service: Cardiovascular;  Laterality: N/A;   RIGHT HEART CATHETERIZATION N/A 09/22/2013   Procedure: RIGHT HEART CATH;  Surgeon: Jolaine Artist, MD;  Location: [facility] CATH LAB;  Service: Cardiovascular;  Laterality: N/A;   SHOULDER ARTHROSCOPY WITH OPEN ROTATOR CUFF REPAIR Right 03/14/2014   Procedure: RIGHT SHOULDER ARTHROSCOPY WITH BICEPS RELEASE, OPEN SUBSCAPULA REPAIR, OPEN SUPRASPINATUS REPAIR.;  Surgeon: Meredith Pel, MD;  Location: [facility];  Service: Orthopedics;  Laterality: Right;   TOE AMPUTATION Right 02/24/2018   GREAT TOE AND 2ND TOE AMPUTATION   TUBAL LIGATION  1970's   Social History   Occupational History    Employer: UNEMPLOYED  Tobacco Use   Smoking status: Former    Packs/day: 3.00    Years: 32.00    Pack years: 96.00    Types: Cigarettes    Quit date: 10/24/1997    Years since quitting: 23.0   Smokeless tobacco: Never  Vaping Use   Vaping Use: Never used  Substance and Sexual Activity   Alcohol use: Not Currently    Comment: "might have 2-3 daiquiris in the summer"   Drug use: No   Sexual activity: Not Currently

## 2020-11-13 ENCOUNTER — Telehealth (HOSPITAL_COMMUNITY): Payer: Self-pay

## 2020-11-13 NOTE — Telephone Encounter (Signed)
Reached out to pt, her home line was busy, tried her cell phone but no answer on that, no message left-she dont know how to check her VM on her cell.  Will try again.   Marylouise Stacks, EMT-Paramedic  11/13/20

## 2020-11-15 ENCOUNTER — Encounter: Payer: Self-pay | Admitting: Family Medicine

## 2020-11-15 ENCOUNTER — Ambulatory Visit (INDEPENDENT_AMBULATORY_CARE_PROVIDER_SITE_OTHER): Payer: HMO | Admitting: Family Medicine

## 2020-11-15 ENCOUNTER — Other Ambulatory Visit: Payer: Self-pay

## 2020-11-15 VITALS — BP 124/78 | HR 84 | Temp 98.7°F | Resp 16 | Ht 66.0 in | Wt 260.0 lb

## 2020-11-15 DIAGNOSIS — I5032 Chronic diastolic (congestive) heart failure: Secondary | ICD-10-CM | POA: Diagnosis not present

## 2020-11-15 DIAGNOSIS — E1165 Type 2 diabetes mellitus with hyperglycemia: Secondary | ICD-10-CM

## 2020-11-15 DIAGNOSIS — N1832 Chronic kidney disease, stage 3b: Secondary | ICD-10-CM

## 2020-11-15 DIAGNOSIS — N390 Urinary tract infection, site not specified: Secondary | ICD-10-CM | POA: Diagnosis not present

## 2020-11-15 MED ORDER — CEPHALEXIN 250 MG PO CAPS: 250.0000 mg | ORAL_CAPSULE | Freq: Every day | ORAL | 2 refills | Status: DC

## 2020-11-15 NOTE — Progress Notes (Signed)
Subjective:    Patient ID: Susan [name], female    DOB: [birth year], 70 y.o.   MRN: [id number]  HPI 08/20/20 Patient is a 70 year old Caucasian female who was previously seen my partner but is here today establishing care with me.  My partner has since retired.  Patient has a history of insulin-dependent diabetes mellitus.  She currently sees an endocrinologist.  She is on an insulin pump.  However her most recent A1c was 11.0.  Patient reports widely variable blood sugars.  For instance this morning it was around 70.  Prior to coming to my office it was over 300.  She is performing sliding scale insulin correction in addition to her maintenance dose.  She also has congestive heart failure and is currently taking torsemide 80 mg twice a day.  She states that she is weighing herself every day and giving herself additional diuretic if her weight goes up more than 3 pounds.  However she was in this office on February 28 as well as on March 22.  In the interval time, she had gained over 20 pounds and had not notified us.  Therefore I question if she is weighing herself every day and sticking to her regimen.  She presents today reporting some mild shortness of breath.  She was discharged from the hospital yesterday.  Her creatinine at discharge was 1.8 down from over 4 on admission.  She has diuresed over 20 pounds over the last few days.  She does have still +1 edema in both legs with chronic venous stasis changes to the mid shin bilaterally.  There are 2 small blisters on the right anterior shin that are not weeping and have scabbed over.  At that time, my plan was: For the present time, I want the patient to stay on torsemide 80 mg twice daily.  I want to check a BMP today to monitor her potassium as well as her renal function.  I have asked the patient to call my office every Monday, Wednesday, and Friday with a weight update so that we can track her weight more closely.  That is the only way I can see to  prevent recurrent admission due to fluid overload because I question if the patient is altering the dose of her diuretic based on her weight.  She states that her weight according to her scales was 259 pounds this morning.  Therefore I would anticipate to try to keep her weight around 259 pounds by her report.  We will need to adjust her torsemide to maintain that and watch her renal function closely.  Of also recommended that she schedule a follow-up appointment with her endocrinologist.  Her A1c was just over 8 in December and had increased to 11 in March.  Therefore I question if her pump is functioning normally or if she has been compliant with the sliding scale.  I want her to schedule a follow-up with her endocrinologist to discuss her management.  These 2 issues I see are the most dangerous for the patient.  09/10/20 Patient was readmitted to the hospital March 29 through April 4 for acute renal failure.  Despite taking her diuretics, the patient became oliguric.  As result we recommended she go to the emergency room where she was found to have a urinary tract infection on top of her chronic kidney disease.  Urinary tract infection showed E. coli and she was treated with Rocephin.  Creatinine on admission was 3.7.  After treatment with antibiotics  it had dropped to her baseline of 1.8 at discharge on April 4.  She was seen by her cardiologist shortly thereafter.  She was complaining of a urinary tract infection and a performed a urinalysis and urine culture.  This showed Enterococcus faecalis.  They started her on Keflex for 7 days.  Her creatinine had risen to 2.5 and her potassium was up to 5.2.  However culture and sensitivity showed that the pathogen was resistant to cephalexin.  She presents today urinalysis shows trace blood, trace leukocyte esterase, 2+ protein along with 2+ glucose.  She states that she gets frequent urinary tract infections.  Given the fact that the most recent was resistant to  Keflex I suspect that this is still the Enterococcus faecalis that she was experiencing at her cardiologist office.  She also reports highly variable blood sugars.  Her sugar this morning was 270.  However she seen sugars as high as 500 and usually in the 300s. At that time, my plan was: Therefore I see 3 issues for this patient that are putting her in the hospital repeatedly.  First is fluid status and her renal function.  Second her recurrent urinary tract infections.  Third her uncontrolled blood sugars.  Today her weight is 255 pounds however she only has trace edema and does not appear fluid overloaded.  Therefore I believe that the 4 diuretic pills in the morning and the 2 diuretic pills in the afternoon are keeping her euvolemic.  I we will recheck her renal function today along with her potassium however at the present time I recommend that she continue her current dose of torsemide.  She has an appointment next week to see her cardiologist but she appears euvolemic to me on exam today.  The second issue is recurrent urinary tract infections.  Most recently she had Enterococcus faecalis that was resistant to Keflex.  I have advised her to stop the Keflex.  I will start her on Bactrim single strength tablets due to her renal function twice daily for 7 days.  We then need to focus on prevention so I will continue her on Bactrim single strength tablets once daily indefinitely thereafter to prevent recurrent urinary tract infections as suppressive therapy.  We will need to watch her renal function/creatinine and potassium closely on the trimethoprim.  I will see the patient back in 2 weeks to recheck her renal function and repeat a urine culture and urinalysis.  Third, the patient's uncontrolled sugars are contributing to her kidney failure as well as recurrent urinary tract infections.  I will call Dr. Valinda Hoar office to see if we can expedite her appointment is right now her sugars seem out of control and I  question if the patient is using the pump properly.   09/24/20 Patient is due to recheck a urinalysis and urine culture to ensure resolution of her urinary tract infections on the daily prophylactic dose of Bactrim.  She is taking 80 mg of her torsemide in the morning and 40 mg of her torsemide in the evening.  However her weight has been steadily increasing:  She does report shortness of breath today and dyspnea on exertion.  She denies any cough or orthopnea or chest pain  At that time, my plan was: Check BMP to monitor potassium and renal function.  Increase torsemide to 80 mg in the morning and 80 mg in the afternoon.  Recheck weight and renal function in 1 week.  Recheck urine culture to ensure resolution of  recurrent urinary tract infection.  10/01/20 After increasing torsemide to 80 mg twice a day, her weight begin to trend down.  Is been stable over the weekend at 252 pounds.  Also her renal function/creatinine last week had improved with 3.03 down from 3.75.  This was despite taking an increased dose of diuretic.  Therefore I believe that we have found a good baseline dose for the patient is maintaining her weight but not exacerbating her renal function.  She is also no longer having urinary tract infections on the prophylactic antibiotic.  Unfortunately her sugars remain out of control per her report.  She states that her third blood sugar this morning was 375.  She is already met with her endocrinologist and they have made some changes but he is she is yet to follow-up again.  At that time, my plan was: Patient appears euvolemic today at 252 pounds.  The swelling in her legs has improved.  She has maintained her weight for 3 straight days 252 pounds.  Her renal function last week was improved despite taking torsemide 80 mg twice a day.  Her urinary tract infections have stopped since she started the prophylactic antibiotic.  Therefore I believe that we have the majority of her condition stable  except her blood sugar.  Her blood sugars seem to be highly erratic.  Therefore I have encouraged the patient to eat more consistently and not eat so much sugar/junk.  She admits to eating cake and drinking soda last night.  I recommended that she follow-up with her endocrinologist as planned to try to help maintain her blood sugars between 100-200.  Also explained that her sugars are running high will likely only expedite the decline in her renal function.  10/08/20 Wt Readings from Last 3 Encounters:  11/15/20 260 lb (117.9 kg)  11/08/20 262 lb (118.8 kg)  11/01/20 269 lb (122 kg)   Patient was calling at the end of last week stating that her weight was increasing 3 and 4 and 5 pounds.  This was despite increasing her Lasix to 80 mg twice a day which had worked well.  Also her renal function had worsened and her creatinine had risen from 3-3.75.  As result we asked the patient to come in for evaluation.  However over the weekend, she decreased her sodium consumption and her weight dropped from 257 pounds on her scale to 253 pounds this morning.  Here today on our scale she is 257 however that is fully dressed and also caring an umbrella.  She has trace bipedal edema but her lungs are completely clear to auscultation and there is no significant swelling over her body.  She admits that at the end of last week she was eating Fritos and chips loaded and salt.  First she is a diabetic so eating chips is a poor dietary choice and I explained this to her.  Second I explained the association between sodium and water and fluid retention and weight gain.  This is most likely the reason that her weight increased several pounds at the end of last week.  It is also likely the reason her weight has dropped several pounds over the weekend as she started avoiding Fritos and chips.  AT that time, my plan was:  Patient does not appear fluid overloaded today.  Therefore I see no cause to increase her diuretic at this point.   However I did have a long discussion with her about proper dietary choices.  I explained  the association between sodium and fluid retention.  The patient must avoid sodium rich foods such as chips, Fritos, canned foods.  I explained to the patient that when she eats these things, she is going to gain water weight rapidly.  When she does we will have to increase her diuretics which only cause increased issues with her renal function.  Therefore I strongly encourage the patient to eat a low-sodium diet to help preserve her kidney function is much as possible.  Check BMP today while the patient is here and if creatinine is stable I will plan to follow-up with the patient in 1 month.  11/15/20 Patient is roughly the same weight from when I saw her in May.  She has been maintaining euvolemia on her current diuretic dose.  She denies any chest pain or shortness of breath.  She has trace bipedal edema.  There is no cough.  She denies any orthopnea or paroxysmal nocturnal dyspnea.  However, the patient reports that her blood sugars are over 250.  She admits that she is not eating right.  She is eating ice cream cake.  I will defer again to her endocrinologist however as I explained to the patient, it is impossible to manage her sugars if she eats irresponsibly.  She does have a seborrheic keratosis on the left side of her neck that she would like removed.  He gets caught in her necklaces and it itches.  It is roughly 6 mm in diameter.  It is a brown warty papule with no concerning features.  Past Medical History:  Diagnosis Date   Acute MI (HCC) 1999; 2007   Anemia    hx   Anginal pain (HCC)    Anxiety    ARF (acute renal failure) (HCC) 06/2017   [facility]   Arthritis    "generalized" (03/15/2014)   CAD (coronary artery disease)    MI in 2000 - MI  2007 - treated bare metal stent (no nuclear since then as 9/11)   Carotid artery disease (HCC)    CHF (congestive heart failure) (HCC)    Chronic  diastolic heart failure (HCC)    a) ECHO (08/2013) EF 55-60% and RV function nl b) RHC (08/2013) RA 4, RV 30/5/7, PA 25/10 (16), PCWP 7, Fick CO/CI 6.3/2.7, PVR 1.5 WU, PA 61 and 66%   Daily headache    "~ every other day; since I fell in June" (03/15/2014)   Depression    Dyslipidemia    Dyspnea    Exertional shortness of breath    HTN (hypertension)    Hypothyroidism    Neuropathy    Obesity    Osteoarthritis    Peripheral neuropathy    PONV (postoperative nausea and vomiting)    RBBB (right bundle branch block)    Old   Stroke (HCC)    mini strokes   Syncope    likely due to low blood sugar   Tachycardia    Sinus tachycardia   Type II diabetes mellitus (HCC)    Type II   Urinary incontinence    Venous insufficiency    Past Surgical History:  Procedure Laterality Date   ABDOMINAL HYSTERECTOMY  1980's   AMPUTATION Right 02/24/2018   Procedure: RIGHT FOOT GREAT TOE AND 2ND TOE AMPUTATION;  Surgeon: Nadara Mustard, MD;  Location: [facility] OR;  Service: Orthopedics;  Laterality: Right;   AMPUTATION Right 04/30/2018   Procedure: RIGHT TRANSMETATARSAL AMPUTATION;  Surgeon: Nadara Mustard, MD;  Location:  [facility] OR;  Service: Orthopedics;  Laterality: Right;   BIOPSY  05/27/2020   Procedure: BIOPSY;  Surgeon: Lanelle Bal, DO;  Location: [facility] ENDO SUITE;  Service: Endoscopy;;   CATARACT EXTRACTION, BILATERAL Bilateral ?2013   COLONOSCOPY W/ POLYPECTOMY     COLONOSCOPY WITH PROPOFOL N/A 03/13/2019   Procedure: COLONOSCOPY WITH PROPOFOL;  Surgeon: Beverley Fiedler, MD;  Location: [facility] ENDOSCOPY;  Service: Gastroenterology;  Laterality: N/A;   CORONARY ANGIOPLASTY WITH STENT PLACEMENT  1999; 2007   "1 + 1"   ESOPHAGOGASTRODUODENOSCOPY (EGD) WITH PROPOFOL N/A 03/13/2019   Procedure: ESOPHAGOGASTRODUODENOSCOPY (EGD) WITH PROPOFOL;  Surgeon: Beverley Fiedler, MD;  Location: [facility] ENDOSCOPY;  Service: Gastroenterology;  Laterality: N/A;   ESOPHAGOGASTRODUODENOSCOPY (EGD) WITH PROPOFOL N/A 05/27/2020    Procedure: ESOPHAGOGASTRODUODENOSCOPY (EGD) WITH PROPOFOL;  Surgeon: Lanelle Bal, DO;  Location: [facility] END[facility]ITE;  Service: Endoscopy;  Laterality: N/A;   EYE SURGERY Bilateral    lazer   HEMOSTASIS CLIP PLACEMENT  03/13/2019   Procedure: HEMOSTASIS CLIP PLACEMENT;  Surgeon: Beverley Fiedler, MD;  Location: [facility] ENDOSCOPY;  Service: Gastroenterology;;   KNEE ARTHROSCOPY Left 10/25/2006   POLYPECTOMY  03/13/2019   Procedure: POLYPECTOMY;  Surgeon: Beverley Fiedler, MD;  Location: [facility] ENDOSCOPY;  Service: Gastroenterology;;   RIGHT HEART CATH N/A 07/24/2017   Procedure: RIGHT HEART CATH;  Surgeon: Dolores Patty, MD;  Location: [facility] INVASIVE CV LAB;  Service: Cardiovascular;  Laterality: N/A;   RIGHT HEART CATHETERIZATION N/A 09/22/2013   Procedure: RIGHT HEART CATH;  Surgeon: Dolores Patty, MD;  Location: [facility] CATH LAB;  Service: Cardiovascular;  Laterality: N/A;   SHOULDER ARTHROSCOPY WITH OPEN ROTATOR CUFF REPAIR Right 03/14/2014   Procedure: RIGHT SHOULDER ARTHROSCOPY WITH BICEPS RELEASE, OPEN SUBSCAPULA REPAIR, OPEN SUPRASPINATUS REPAIR.;  Surgeon: Cammy Copa, MD;  Location: [facility] OR;  Service: Orthopedics;  Laterality: Right;   TOE AMPUTATION Right 02/24/2018   GREAT TOE AND 2ND TOE AMPUTATION   TUBAL LIGATION  1970's   Current Outpatient Medications on File Prior to Visit  Medication Sig Dispense Refill   acetaminophen (TYLENOL) 500 MG tablet Take 1,000 mg by mouth every 6 (six) hours as needed for moderate pain or headache.     albuterol (VENTOLIN HFA) 108 (90 Base) MCG/ACT inhaler TAKE 2 PUFFS BY MOUTH EVERY 6 HOURS AS NEEDED FOR WHEEZE OR SHORTNESS OF BREATH (Patient taking differently: Inhale 2 puffs into the lungs every 6 (six) hours as needed for wheezing or shortness of breath.) 8.5 g 1   allopurinol (ZYLOPRIM) 100 MG tablet TAKE 1 TABLET BY MOUTH TWICE A DAY (Patient taking differently: Take 100 mg by mouth 2 (two) times daily.) 180 tablet 4   Ascorbic Acid (VITAMIN C) 1000 MG  tablet Take 1,000 mg by mouth daily.     aspirin EC 81 MG tablet Take 1 tablet (81 mg total) by mouth daily with breakfast. 30 tablet 11   carvedilol (COREG) 12.5 MG tablet TAKE 1 TABLET (12.5 MG TOTAL) BY MOUTH 2 (TWO) TIMES DAILY WITH A MEAL. 180 tablet 1   cholecalciferol (VITAMIN D) 1000 units tablet Take 1,000 Units by mouth daily with supper.      clopidogrel (PLAVIX) 75 MG tablet TAKE 1 TABLET BY MOUTH DAILY WITH BREAKFAST. 90 tablet 3   ferrous sulfate 325 (65 FE) MG tablet Take 1 tablet (325 mg total) by mouth 2 (two) times daily with a meal. Reported on 08/21/2015 60 tablet 2   FLUoxetine (PROZAC) 20 MG capsule TAKE 1 CAPSULE  BY MOUTH EVERY DAY 90 capsule 2   guaiFENesin-dextromethorphan (ROBITUSSIN DM) 100-10 MG/5ML syrup Take 10 mLs by mouth every 4 (four) hours as needed for cough. 118 mL 0   Insulin Disposable Pump (OMNIPOD DASH 5 PACK PODS) MISC Inject into the skin See admin instructions. Use continuously with Novolin R - change every 72 hours.     insulin NPH Human (HUMULIN N,NOVOLIN N) 100 UNIT/ML injection Inject 15 Units into the skin See admin instructions. Inject 15 units subcutaneously in the morning if CBG >300;     insulin regular (NOVOLIN R) 100 units/mL injection Inject into the skin See admin instructions. Manually add bolus to continuous dose via OmniPod 3 times daily per sliding scale (CBG 80-150 7 units, 151-200 9 units, 201-250 12 units, 251-300 14 units, 301- 400 17 units)     levothyroxine (SYNTHROID, LEVOTHROID) 50 MCG tablet Take 1 tablet (50 mcg total) by mouth daily before breakfast. 90 tablet 1   magnesium oxide (MAG-OX) 400 MG tablet TAKE 1 TABLET BY MOUTH EVERY DAY (Patient taking differently: Take 400 mg by mouth daily.) 120 tablet 2   Multiple Vitamins-Minerals (AIRBORNE GUMMIES PO) Take 2 tablets by mouth every evening.     Multiple Vitamins-Minerals (CENTRUM SILVER 50+WOMEN) TABS Take 1 tablet by mouth every evening.     Multiple Vitamins-Minerals (ZINC PO)  Take 1 tablet by mouth every evening.     nitroGLYCERIN (NITROSTAT) 0.4 MG SL tablet Place 1 tablet (0.4 mg total) under the tongue every 5 (five) minutes as needed for chest pain. 25 tablet 1   ONETOUCH VERIO test strip 3 (three) times daily.     pantoprazole (PROTONIX) 40 MG tablet Take 40 mg by mouth daily.     polyethylene glycol (MIRALAX / GLYCOLAX) 17 g packet Take 17 g by mouth daily. 14 each 0   pregabalin (LYRICA) 150 MG capsule Take 1 capsule (150 mg total) by mouth 2 (two) times daily. 60 capsule 2   torsemide (DEMADEX) 20 MG tablet Take 5 tablets (100 mg total) by mouth in the morning AND 4 tablets (80 mg total) every evening. 270 tablet 11   traZODone (DESYREL) 100 MG tablet TAKE 1 AND 1/2 TABLETS BY MOUTH AT BEDTIME AS NEEDED FOR SLEEP. 135 tablet 1   ursodiol (ACTIGALL) 500 MG tablet Take 500 mg by mouth 3 (three) times daily.     No current facility-administered medications on file prior to visit.   Allergies  Allergen Reactions   Drug Ingredient [Cephalexin] Diarrhea    Pt reports heavy diarrhea with use of keflex   Codeine Nausea And Vomiting   Social History   Socioeconomic History   Marital status: Married    Spouse name: Not on file   Number of children: 3   Years of education: 12th   Highest education level: Not on file  Occupational History    Employer: UNEMPLOYED  Tobacco Use   Smoking status: Former    Packs/day: 3.00    Years: 32.00    Pack years: 96.00    Types: Cigarettes    Quit date: 10/24/1997    Years since quitting: 23.0   Smokeless tobacco: Never  Vaping Use   Vaping Use: Never used  Substance and Sexual Activity   Alcohol use: Not Currently    Comment: "might have 2-3 daiquiris in the summer"   Drug use: No   Sexual activity: Not Currently  Other Topics Concern   Not on file  Social History Narrative  Pt lives at home with her spouse.   Caffeine Use- 3 sodas daily   Social Determinants of Health   Financial Resource Strain: Low  Risk    Difficulty of Paying Living Expenses: Not very hard  Food Insecurity: No Food Insecurity   Worried About Programme researcher, broadcasting/film/video in the Last Year: Never true   Ran Out of Food in the Last Year: Never true  Transportation Needs: No Transportation Needs   Lack of Transportation (Medical): No   Lack of Transportation (Non-Medical): No  Physical Activity: Not on file  Stress: Not on file  Social Connections: Not on file  Intimate Partner Violence: Not on file     Review of Systems     Objective:   Physical Exam Constitutional:      General: She is not in acute distress.    Appearance: Normal appearance. She is obese. She is not ill-appearing or toxic-appearing.  Cardiovascular:     Rate and Rhythm: Normal rate and regular rhythm.     Heart sounds: Normal heart sounds. No murmur heard.   No friction rub. No gallop.  Pulmonary:     Effort: Pulmonary effort is normal. No respiratory distress.     Breath sounds: Normal breath sounds. No stridor. No wheezing, rhonchi or rales.  Abdominal:     General: Bowel sounds are normal.     Palpations: Abdomen is soft.  Musculoskeletal:     Right lower leg: No edema.     Left lower leg: No edema.  Neurological:     Mental Status: She is alert.          Assessment & Plan:  Chronic diastolic CHF (congestive heart failure) (HCC) - Plan: BASIC METABOLIC PANEL WITH GFR  Stage 3b chronic kidney disease (HCC)  Uncontrolled type 2 diabetes mellitus with hyperglycemia (HCC)  Recurrent UTI I anesthetized the seborrheic keratosis on the left side of her neck and removed it using sterile technique with a razor blade.  Hemostasis was achieved with Drysol and a Band-Aid.  The lesion was discarded as it had no concerning features.  Wound care was discussed.  Patient appears euvolemic today.  Therefore I will continue the patient on torsemide 100 mg in the morning and 80 mg in the evening.  I will check a BMP today to monitor her potassium and  her renal function.  I emphasized the need for compliance with her diet and the impact that this has on her long-term kidney function as well as long-term morbidity and mortality however the patient is noncompliant when it comes to managing her blood sugars.  Patient has a history of recurrent urinary tract infections.  We initially had her on Bactrim as a preventative.  Nephrology requested that we stop this due to nephrotoxicity.  Unfortunately the patient had Enterobacter urinary tract infections on 2 separate occasions that were sensitive only to trimethoprim sulfamethoxazole.  We discussed the options and the patient would like to try low-dose Keflex 250 mg daily despite that it may have caused diarrhea in the past for prevention of other urinary tract infections to see if this helps.  If she develops diarrhea she will discontinue it immediately

## 2020-11-16 LAB — BASIC METABOLIC PANEL WITH GFR
BUN/Creatinine Ratio: 21 (calc) (ref 6–22)
BUN: 81 mg/dL — ABNORMAL HIGH (ref 7–25)
CO2: 28 mmol/L (ref 20–32)
Calcium: 9.4 mg/dL (ref 8.6–10.4)
Chloride: 91 mmol/L — ABNORMAL LOW (ref 98–110)
Creat: 3.85 mg/dL — ABNORMAL HIGH (ref 0.60–0.93)
GFR, Est African American: 13 mL/min/{1.73_m2} — ABNORMAL LOW (ref >=60)
GFR, Est Non African American: 11 mL/min/{1.73_m2} — ABNORMAL LOW (ref >=60)
Glucose, Bld: 409 mg/dL — ABNORMAL HIGH (ref 65–99)
Potassium: 4.2 mmol/L (ref 3.5–5.3)
Sodium: 132 mmol/L — ABNORMAL LOW (ref 135–146)

## 2020-11-20 ENCOUNTER — Telehealth (HOSPITAL_COMMUNITY): Payer: Self-pay

## 2020-11-20 ENCOUNTER — Other Ambulatory Visit (HOSPITAL_COMMUNITY): Payer: Self-pay | Admitting: Cardiology

## 2020-11-20 NOTE — Telephone Encounter (Signed)
Pt called me back from last week, she reports her weight is trending up-she has gone from 256-258-262 today. She said this week when she filled up her pill box she forgot to add the 5th tablet of torsemide to her morning dosing. She gave her list of meds to her PCP last week and they kept it so she forgot. She also admits to diet indiscretions with lots of ice cream/sherbet and fast foods. She restarted the 5 tablets of torsemide this morning.  She also noticed her omnipod has been acting up and not charging good so she did call the company and they were going to send her a replacement one, it was suppose to be here this week yesterday or today. She has not seen it yet. Told her to call the company back and see if they can track it or send her another one out.  She has clinic appoint next week. I will meet her there.  She will be leaving to go out of town from 7/9-7/18 and will be prepping to get things ready and done by then so she wont be available for a home visit any so I will meet her in clinic.   Marylouise Stacks, EMT-Paramedic  11/20/20

## 2020-11-22 ENCOUNTER — Telehealth: Payer: Self-pay | Admitting: Licensed Clinical Social Worker

## 2020-11-22 NOTE — Telephone Encounter (Signed)
Reminder message left at [phone] inviting pt to Women[facility] which will be held 7/6 at 10 am at [place].   Westley Hummer, MSW, [place]  [phone]

## 2020-11-29 ENCOUNTER — Ambulatory Visit (HOSPITAL_COMMUNITY)
Admission: RE | Admit: 2020-11-29 | Discharge: 2020-11-29 | Disposition: A | Discharge status: Home / Self Care | Payer: HMO | Source: Ambulatory Visit | Attending: Family Medicine | Admitting: Family Medicine

## 2020-11-29 ENCOUNTER — Other Ambulatory Visit: Payer: Self-pay

## 2020-11-29 ENCOUNTER — Encounter (HOSPITAL_COMMUNITY): Payer: Self-pay

## 2020-11-29 ENCOUNTER — Other Ambulatory Visit (HOSPITAL_COMMUNITY): Payer: Self-pay

## 2020-11-29 VITALS — BP 139/72 | HR 80 | Wt 262.2 lb

## 2020-11-29 DIAGNOSIS — E785 Hyperlipidemia, unspecified: Secondary | ICD-10-CM | POA: Insufficient documentation

## 2020-11-29 DIAGNOSIS — Z87440 Personal history of urinary (tract) infections: Secondary | ICD-10-CM | POA: Diagnosis not present

## 2020-11-29 DIAGNOSIS — Z7902 Long term (current) use of antithrombotics/antiplatelets: Secondary | ICD-10-CM | POA: Insufficient documentation

## 2020-11-29 DIAGNOSIS — Z9641 Presence of insulin pump (external) (internal): Secondary | ICD-10-CM | POA: Diagnosis not present

## 2020-11-29 DIAGNOSIS — I2510 Atherosclerotic heart disease of native coronary artery without angina pectoris: Secondary | ICD-10-CM

## 2020-11-29 DIAGNOSIS — N184 Chronic kidney disease, stage 4 (severe): Secondary | ICD-10-CM | POA: Insufficient documentation

## 2020-11-29 DIAGNOSIS — I252 Old myocardial infarction: Secondary | ICD-10-CM | POA: Diagnosis not present

## 2020-11-29 DIAGNOSIS — Z79899 Other long term (current) drug therapy: Secondary | ICD-10-CM | POA: Diagnosis not present

## 2020-11-29 DIAGNOSIS — Z7982 Long term (current) use of aspirin: Secondary | ICD-10-CM | POA: Diagnosis not present

## 2020-11-29 DIAGNOSIS — N390 Urinary tract infection, site not specified: Secondary | ICD-10-CM

## 2020-11-29 DIAGNOSIS — Z6841 Body Mass Index (BMI) 40.0 and over, adult: Secondary | ICD-10-CM | POA: Insufficient documentation

## 2020-11-29 DIAGNOSIS — I130 Hypertensive heart and chronic kidney disease with heart failure and stage 1 through stage 4 chronic kidney disease, or unspecified chronic kidney disease: Secondary | ICD-10-CM | POA: Insufficient documentation

## 2020-11-29 DIAGNOSIS — E1165 Type 2 diabetes mellitus with hyperglycemia: Secondary | ICD-10-CM

## 2020-11-29 DIAGNOSIS — I4510 Unspecified right bundle-branch block: Secondary | ICD-10-CM | POA: Insufficient documentation

## 2020-11-29 DIAGNOSIS — Z8673 Personal history of transient ischemic attack (TIA), and cerebral infarction without residual deficits: Secondary | ICD-10-CM | POA: Insufficient documentation

## 2020-11-29 DIAGNOSIS — Z89431 Acquired absence of right foot: Secondary | ICD-10-CM | POA: Diagnosis not present

## 2020-11-29 DIAGNOSIS — R5383 Other fatigue: Secondary | ICD-10-CM

## 2020-11-29 DIAGNOSIS — E1142 Type 2 diabetes mellitus with diabetic polyneuropathy: Secondary | ICD-10-CM | POA: Insufficient documentation

## 2020-11-29 DIAGNOSIS — Z794 Long term (current) use of insulin: Secondary | ICD-10-CM | POA: Insufficient documentation

## 2020-11-29 DIAGNOSIS — I10 Essential (primary) hypertension: Secondary | ICD-10-CM

## 2020-11-29 DIAGNOSIS — Z955 Presence of coronary angioplasty implant and graft: Secondary | ICD-10-CM | POA: Diagnosis not present

## 2020-11-29 DIAGNOSIS — E1122 Type 2 diabetes mellitus with diabetic chronic kidney disease: Secondary | ICD-10-CM

## 2020-11-29 DIAGNOSIS — I428 Other cardiomyopathies: Secondary | ICD-10-CM | POA: Insufficient documentation

## 2020-11-29 DIAGNOSIS — I5032 Chronic diastolic (congestive) heart failure: Secondary | ICD-10-CM | POA: Diagnosis not present

## 2020-11-29 LAB — BASIC METABOLIC PANEL
Anion gap: 10 (ref 5–15)
BUN: 93 mg/dL — ABNORMAL HIGH (ref 8–23)
CO2: 29 mmol/L (ref 22–32)
Calcium: 9.4 mg/dL (ref 8.9–10.3)
Chloride: 98 mmol/L (ref 98–111)
Creatinine, Ser: 3.65 mg/dL — ABNORMAL HIGH (ref 0.44–1.00)
GFR, Estimated: 13 mL/min — ABNORMAL LOW (ref >60)
Glucose, Bld: 131 mg/dL — ABNORMAL HIGH (ref 70–99)
Potassium: 4.1 mmol/L (ref 3.5–5.1)
Sodium: 137 mmol/L (ref 135–145)

## 2020-11-29 NOTE — Patient Instructions (Signed)
Routine lab work today. Will notify you of abnormal results  Follow up in 2-3 months   Do the following things EVERYDAY: Weigh yourself in the morning before breakfast. Write it down and keep it in a log. Take your medicines as prescribed Eat low salt foods--Limit salt (sodium) to 2000 mg per day.  Stay as active as you can everyday Limit all fluids for the day to less than 2 liters

## 2020-11-29 NOTE — Progress Notes (Signed)
Advanced Heart Failure Clinic Note   Primary Physician [name], MD HF Cardiologist: Glori Bickers, MD  Nephrology: Dr Hollie Salk  Reason for Visit:  F/u for Chronic Diastolic Heart Failure  HPI:  Susan [name] is a 70 y.o. female with a history of RBBB, DM, Hypothyroidish, diastolic heart failure, CAD MI 2000/2007 BMS 2007, TIA, obesity, CKD Stage IV and  Rt transmetatarsal amputation.    Admitted June 1st through June 3rd, 2017 with altered mental status, hypothermia, and weakness. Diuretics initially held and later restarted.    Admitted 2/27-07/28/17 for AKI. Torsemide was held and restarted at 60 mg BID. She had RHC 07/24/17 that showed mild PAH with evidence of RV strain likely due to OHS/OSA. She was discharged to SNF.    Admitted 08/979 with A/C diastolic HF. Diuresed with IV lasix.    Admitted 10/24-10/27/19 with syncope. She was orthostatic. Had AKI with creatinine up to 3.33 and required IVF. Renal US showed no hydronephrosis. Losartan and Imdur were DC'd. Creatinine 1.76 on day of DC.   Was admitted 1/21 for confusion and abdominal pain. Found to have Klebsiella pneumoniae UTI treated w/ abx. Also with elevated LFTs. CT of abdomen showed cholelithiasis. MRCP showed cholelithiasis without evidence of cholecystitis or choledocholithiasis. Abdominal US with doppler showed no acute finding.  No concern for cholangitis. All viral serologies negative. Her Crestor was discontinued. She had outpatient GI f/u and continued work-up including a liver biopsy 08/11/19. No definite pathologic fibrosis noted in pathology report.   Admitted to [facility] 05/17/20 with N/V/D in the setting of possible gastroparesis.    Admitted to [facility] 05/24/20 with and A/C diastolic heart failure. Diuresed with IV lasix and transitioned to torsemide 80 mg twice a day.   She returned for HF follow up 1/22. Stressed out about taking care of her disabled son. SOB with exertion. + Orthopnea. Eating fast food. Not  weighing at home. Taking all medications. Lives with her husband and disabled son. She was instructed to increase torsemide to 100 mg bid x 2 days, then back down to 80 mg bid.  Had return f/u again on 2/11 and was still taking 100 of torsemide bid and had not reduced back down to 80 bid as previously directed. Labs showed AKI, w/ SCr rising from baseline of 1.9>>2.4. Was advised to reduce torsemide to 80 mg once daily x 3 days, then return to 80 mg bid.   Had home sleep study 07/25/20 and this was negative for OSA.   08/14/20, she was admitted to [facility] with AKI and progressive volume overloaded in setting of recurrent UTI and probable ATN. SCr rose to 4.1. She was placed on IV abx and IV lasix w/ improved renal function and volume status. SCr was down to 1.78 day of d/c. PT had recommended SNF at d/c however pt declined. She was discharged home on 3/27 but readmitted 2 days later for urinary retention and recurrent AKI. SCr had increased back up to 3.1. UA showed persistent UTI. She was placed back on IV abx.  CT without hydronephrosis. SCr improved w/ tx of UTI. SCr back down to 1.7 on day of d/c and she was started back on PO torsemide but discharged home on 80 mg once daily (previous dose was 80 bid). She was discharged home on 4/4.    She was seen back for post hospital f/u on 09/04/20 and she was mildly fluid overloaded. Wt was up 7 lb from d/c wt and she reported dysuria.  UA + for UTI. UC + for Enterobacter aerogenes. She was started on 10 day course of Keflex and increased torsemide to 80 mg qhs/ 40 mg qpm. Advised she f/u w/ PCP for further management of recurrent UTIs for potential long term suppressive abx therapy.   Seen in clinic 6/22, volume up, weight up 12 lbs, eating more salty foods. Torsemide increased. Stable NYHA III symptoms.  Today she returns for HF follow up with paramedicine. Overall feeling fine. SOB with walking on flat ground. Some swelling in feet/legs. Denies increasing SOB, CP,  dizziness, edema, or PND/Orthopnea. Appetite ok. No fever or chills. Weight at home 264 pounds. Taking all medications. Per paramedic, BPs at home run 110s-140s. She denies any active dysuria. Eats out 2x/week. Says she is drinking too much.   Cardiac Studies: Echo (9/21): EF 55-60%, RV ok Echo (10/19): EF 55-60%, Grade I DD  RHC (07/24/17): showed mild PAH with evidence of RV strain likely due to OHS/OSA.  Findings: RA = 17 RV = 48/15 PA = 49/11 (30) PCW = 18 Fick cardiac output/index = 7.0/3.0 PVR = 0.5 WU Ao sat = 97% PA sat = 62%, 64% Assessment: 1. Normal left-sided pressures 2. Mild PAH with evidence of RV strain likely due to OHS/OSA 3. Normal cardiac output  Lexiscan w/ stress echo (2018): EF 68%, low risk  LHC (2007): BMS distal RCA Cardiolite (03/2002): EF of 51% but no ischemia LHC (2000): BMS RCA   ROS: All systems reviewed and negative except as per HPI.   Current Meds  Medication Sig   acetaminophen (TYLENOL) 500 MG tablet Take 1,000 mg by mouth every 6 (six) hours as needed for moderate pain or headache.   albuterol (VENTOLIN HFA) 108 (90 Base) MCG/ACT inhaler TAKE 2 PUFFS BY MOUTH EVERY 6 HOURS AS NEEDED FOR WHEEZE OR SHORTNESS OF BREATH   allopurinol (ZYLOPRIM) 100 MG tablet TAKE 1 TABLET BY MOUTH TWICE A DAY   Ascorbic Acid (VITAMIN C) 1000 MG tablet Take 1,000 mg by mouth daily.   aspirin EC 81 MG tablet Take 1 tablet (81 mg total) by mouth daily with breakfast.   carvedilol (COREG) 12.5 MG tablet TAKE 1 TABLET (12.5 MG TOTAL) BY MOUTH 2 (TWO) TIMES DAILY WITH A MEAL.   cephALEXin (KEFLEX) 250 MG capsule Take 1 capsule (250 mg total) by mouth daily.   cholecalciferol (VITAMIN D) 1000 units tablet Take 1,000 Units by mouth daily with supper.    clopidogrel (PLAVIX) 75 MG tablet TAKE 1 TABLET BY MOUTH DAILY WITH BREAKFAST.   ferrous sulfate 325 (65 FE) MG tablet Take 1 tablet (325 mg total) by mouth 2 (two) times daily with a meal. Reported on 08/21/2015    FLUoxetine (PROZAC) 20 MG capsule TAKE 1 CAPSULE BY MOUTH EVERY DAY   guaiFENesin-dextromethorphan (ROBITUSSIN DM) 100-10 MG/5ML syrup Take 10 mLs by mouth every 4 (four) hours as needed for cough.   Insulin Disposable Pump (OMNIPOD DASH 5 PACK PODS) MISC Inject into the skin See admin instructions. Use continuously with Novolin R - change every 72 hours.   insulin NPH Human (HUMULIN N,NOVOLIN N) 100 UNIT/ML injection Inject 15 Units into the skin See admin instructions. Inject 15 units subcutaneously in the morning if CBG >300;   insulin regular (NOVOLIN R) 100 units/mL injection Inject into the skin See admin instructions. Manually add bolus to continuous dose via OmniPod 3 times daily per sliding scale (CBG 80-150 7 units, 151-200 9 units, 201-250 12 units, 251-300 14 units, 301-  400 17 units)   levothyroxine (SYNTHROID, LEVOTHROID) 50 MCG tablet Take 1 tablet (50 mcg total) by mouth daily before breakfast.   magnesium oxide (MAG-OX) 400 MG tablet TAKE 1 TABLET BY MOUTH EVERY DAY   Multiple Vitamins-Minerals (AIRBORNE GUMMIES PO) Take 2 tablets by mouth every evening.   Multiple Vitamins-Minerals (CENTRUM SILVER 50+WOMEN) TABS Take 1 tablet by mouth every evening.   Multiple Vitamins-Minerals (ZINC PO) Take 1 tablet by mouth every evening.   nitroGLYCERIN (NITROSTAT) 0.4 MG SL tablet Place 1 tablet (0.4 mg total) under the tongue every 5 (five) minutes as needed for chest pain.   ONETOUCH VERIO test strip 3 (three) times daily.   pantoprazole (PROTONIX) 40 MG tablet Take 40 mg by mouth daily.   polyethylene glycol (MIRALAX / GLYCOLAX) 17 g packet Take 17 g by mouth daily.   pregabalin (LYRICA) 150 MG capsule Take 1 capsule (150 mg total) by mouth 2 (two) times daily.   torsemide (DEMADEX) 20 MG tablet Take 5 tablets (100 mg total) by mouth in the morning AND 4 tablets (80 mg total) every evening.   traZODone (DESYREL) 100 MG tablet Take 100 mg by mouth as needed for sleep.   Allergies  Allergen  Reactions   Drug Ingredient [Cephalexin] Diarrhea    Pt reports heavy diarrhea with use of keflex   Codeine Nausea And Vomiting   Past Medical History:  Diagnosis Date   Acute MI ([place]) 1999; 2007   Anemia    hx   Anginal pain (HCC)    Anxiety    ARF (acute renal failure) ([place]) 06/2017   Staley Kidney Asso   Arthritis    "generalized" (03/15/2014)   CAD (coronary artery disease)    MI in 2000 - MI  2007 - treated bare metal stent (no nuclear since then as 9/11)   Carotid artery disease (HCC)    CHF (congestive heart failure) ([place])    Chronic diastolic heart failure ([place])    a) ECHO (08/2013) EF 55-60% and RV function nl b) RHC (08/2013) RA 4, RV 30/5/7, PA 25/10 (16), PCWP 7, Fick CO/CI 6.3/2.7, PVR 1.5 WU, PA 61 and 66%   Daily headache    "~ every other day; since I fell in June" (03/15/2014)   Depression    Dyslipidemia    Dyspnea    Exertional shortness of breath    HTN (hypertension)    Hypothyroidism    Neuropathy    Obesity    Osteoarthritis    Peripheral neuropathy    PONV (postoperative nausea and vomiting)    RBBB (right bundle branch block)    Old   Stroke ([place])    mini strokes   Syncope    likely due to low blood sugar   Tachycardia    Sinus tachycardia   Type II diabetes mellitus (HCC)    Type II   Urinary incontinence    Venous insufficiency    Family History  Problem Relation Age of Onset   Heart attack Mother 74   Past Surgical History:  Procedure Laterality Date   ABDOMINAL HYSTERECTOMY  1980's   AMPUTATION Right 02/24/2018   Procedure: RIGHT FOOT GREAT TOE AND 2ND TOE AMPUTATION;  Surgeon: Newt Minion, MD;  Location: [place];  Service: Orthopedics;  Laterality: Right;   AMPUTATION Right 04/30/2018   Procedure: RIGHT TRANSMETATARSAL AMPUTATION;  Surgeon: Newt Minion, MD;  Location: [place];  Service: Orthopedics;  Laterality: Right;   BIOPSY  05/27/2020  Procedure: BIOPSY;  Surgeon: [name], DO;  [facility];  Service:  Endoscopy;;   CATARACT EXTRACTION, BILATERAL Bilateral ?2013   COLONOSCOPY W/ POLYPECTOMY     COLONOSCOPY WITH PROPOFOL N/A 03/13/2019   Procedure: COLONOSCOPY WITH PROPOFOL;  Surgeon: Jerene Bears, MD;  Location: [place];  Service: Gastroenterology;  Laterality: N/A;   [place]; 2007   "1 + 1"   ESOPHAGOGASTRODUODENOSCOPY (EGD) WITH PROPOFOL N/A 03/13/2019   Procedure: ESOPHAGOGASTRODUODENOSCOPY (EGD) WITH PROPOFOL;  Surgeon: Jerene Bears, MD;  Location: [place];  Service: Gastroenterology;  Laterality: N/A;   ESOPHAGOGASTRODUODENOSCOPY (EGD) WITH PROPOFOL N/A 05/27/2020   Procedure: ESOPHAGOGASTRODUODENOSCOPY (EGD) WITH PROPOFOL;  Surgeon: [name], DO;  [facility];  Service: Endoscopy;  Laterality: N/A;   EYE SURGERY Bilateral    lazer   HEMOSTASIS CLIP PLACEMENT  03/13/2019   Procedure: HEMOSTASIS CLIP PLACEMENT;  Surgeon: Jerene Bears, MD;  Location: [facility] ENDOSCOPY;  Service: Gastroenterology;;   KNEE ARTHROSCOPY Left 10/25/2006   POLYPECTOMY  03/13/2019   Procedure: POLYPECTOMY;  Surgeon: Jerene Bears, MD;  Location: [place];  Service: Gastroenterology;;   RIGHT HEART CATH N/A 07/24/2017   Procedure: RIGHT HEART CATH;  Surgeon: Jolaine Artist, MD;  Location: [place] CV LAB;  Service: Cardiovascular;  Laterality: N/A;   RIGHT HEART CATHETERIZATION N/A 09/22/2013   Procedure: RIGHT HEART CATH;  Surgeon: Jolaine Artist, MD;  Location: [facility] CATH LAB;  Service: Cardiovascular;  Laterality: N/A;   SHOULDER ARTHROSCOPY WITH OPEN ROTATOR CUFF REPAIR Right 03/14/2014   Procedure: RIGHT SHOULDER ARTHROSCOPY WITH BICEPS RELEASE, OPEN SUBSCAPULA REPAIR, OPEN SUPRASPINATUS REPAIR.;  Surgeon: Meredith Pel, MD;  Location: [place];  Service: Orthopedics;  Laterality: Right;   TOE AMPUTATION Right 02/24/2018   GREAT TOE AND 2ND TOE AMPUTATION   TUBAL LIGATION  1970's   Social History   Socioeconomic History    Marital status: Married    Spouse name: Not on file   Number of children: 3   Years of education: 12th   Highest education level: Not on file  Occupational History    Employer: UNEMPLOYED  Tobacco Use   Smoking status: Former    Packs/day: 3.00    Years: 32.00    Pack years: 96.00    Types: Cigarettes    Quit date: 10/24/1997    Years since quitting: 23.1   Smokeless tobacco: Never  Vaping Use   Vaping Use: Never used  Substance and Sexual Activity   Alcohol use: Not Currently    Comment: "might have 2-3 daiquiris in the summer"   Drug use: No   Sexual activity: Not Currently  Other Topics Concern   Not on file  Social History Narrative   Pt lives at home with her spouse.   Caffeine Use- 3 sodas daily   Social Determinants of Health   Financial Resource Strain: Low Risk    Difficulty of Paying Living Expenses: Not very hard  Food Insecurity: No Food Insecurity   Worried About Charity fundraiser in the Last Year: Never true   Ran Out of Food in the Last Year: Never true  Transportation Needs: No Transportation Needs   Lack of Transportation (Medical): No   Lack of Transportation (Non-Medical): No  Physical Activity: Not on file  Stress: Not on file  Social Connections: Not on file  Intimate Partner Violence: Not on file   Lipid Panel  Component Value Date/Time   CHOL 242 (H) 12/21/2019 1550   TRIG 125 03/07/2020 1808   HDL 52 12/21/2019 1550   CHOLHDL 4.7 12/21/2019 1550   VLDL 60 (H) 12/21/2019 1550   LDLCALC 130 (H) 12/21/2019 1550   LDLCALC 65 01/17/2019 1608   Current Outpatient Medications on File Prior to Encounter  Medication Sig Dispense Refill   acetaminophen (TYLENOL) 500 MG tablet Take 1,000 mg by mouth every 6 (six) hours as needed for moderate pain or headache.     albuterol (VENTOLIN HFA) 108 (90 Base) MCG/ACT inhaler TAKE 2 PUFFS BY MOUTH EVERY 6 HOURS AS NEEDED FOR WHEEZE OR SHORTNESS OF BREATH 8.5 g 1   allopurinol (ZYLOPRIM) 100 MG tablet  TAKE 1 TABLET BY MOUTH TWICE A DAY 180 tablet 4   Ascorbic Acid (VITAMIN C) 1000 MG tablet Take 1,000 mg by mouth daily.     aspirin EC 81 MG tablet Take 1 tablet (81 mg total) by mouth daily with breakfast. 30 tablet 11   carvedilol (COREG) 12.5 MG tablet TAKE 1 TABLET (12.5 MG TOTAL) BY MOUTH 2 (TWO) TIMES DAILY WITH A MEAL. 180 tablet 1   cephALEXin (KEFLEX) 250 MG capsule Take 1 capsule (250 mg total) by mouth daily. 30 capsule 2   cholecalciferol (VITAMIN D) 1000 units tablet Take 1,000 Units by mouth daily with supper.      clopidogrel (PLAVIX) 75 MG tablet TAKE 1 TABLET BY MOUTH DAILY WITH BREAKFAST. 90 tablet 3   ferrous sulfate 325 (65 FE) MG tablet Take 1 tablet (325 mg total) by mouth 2 (two) times daily with a meal. Reported on 08/21/2015 60 tablet 2   FLUoxetine (PROZAC) 20 MG capsule TAKE 1 CAPSULE BY MOUTH EVERY DAY 90 capsule 2   guaiFENesin-dextromethorphan (ROBITUSSIN DM) 100-10 MG/5ML syrup Take 10 mLs by mouth every 4 (four) hours as needed for cough. 118 mL 0   Insulin Disposable Pump (OMNIPOD DASH 5 PACK PODS) MISC Inject into the skin See admin instructions. Use continuously with Novolin R - change every 72 hours.     insulin NPH Human (HUMULIN N,NOVOLIN N) 100 UNIT/ML injection Inject 15 Units into the skin See admin instructions. Inject 15 units subcutaneously in the morning if CBG >300;     insulin regular (NOVOLIN R) 100 units/mL injection Inject into the skin See admin instructions. Manually add bolus to continuous dose via OmniPod 3 times daily per sliding scale (CBG 80-150 7 units, 151-200 9 units, 201-250 12 units, 251-300 14 units, 301- 400 17 units)     levothyroxine (SYNTHROID, LEVOTHROID) 50 MCG tablet Take 1 tablet (50 mcg total) by mouth daily before breakfast. 90 tablet 1   magnesium oxide (MAG-OX) 400 MG tablet TAKE 1 TABLET BY MOUTH EVERY DAY 120 tablet 2   Multiple Vitamins-Minerals (AIRBORNE GUMMIES PO) Take 2 tablets by mouth every evening.     Multiple  Vitamins-Minerals (CENTRUM SILVER 50+WOMEN) TABS Take 1 tablet by mouth every evening.     Multiple Vitamins-Minerals (ZINC PO) Take 1 tablet by mouth every evening.     nitroGLYCERIN (NITROSTAT) 0.4 MG SL tablet Place 1 tablet (0.4 mg total) under the tongue every 5 (five) minutes as needed for chest pain. 25 tablet 1   ONETOUCH VERIO test strip 3 (three) times daily.     pantoprazole (PROTONIX) 40 MG tablet Take 40 mg by mouth daily.     polyethylene glycol (MIRALAX / GLYCOLAX) 17 g packet Take 17 g by mouth daily. 14 each  0   pregabalin (LYRICA) 150 MG capsule Take 1 capsule (150 mg total) by mouth 2 (two) times daily. 60 capsule 2   torsemide (DEMADEX) 20 MG tablet Take 5 tablets (100 mg total) by mouth in the morning AND 4 tablets (80 mg total) every evening. 270 tablet 11   traZODone (DESYREL) 100 MG tablet Take 100 mg by mouth as needed for sleep.     No current facility-administered medications on file prior to encounter.    Wt Readings from Last 3 Encounters:  11/29/20 118.9 kg (262 lb 3.2 oz)  11/15/20 117.9 kg (260 lb)  11/08/20 118.8 kg (262 lb)   BP 139/72   Pulse 80   Wt 118.9 kg (262 lb 3.2 oz)   SpO2 96%   BMI 42.32 kg/m   Body mass index is 42.32 kg/m.  PHYSICAL EXAM: General:  NAD. No resp difficulty HEENT: Normal Neck: Supple. No JVD. Carotids 2+ bilat; no bruits. No lymphadenopathy or thryomegaly appreciated. Cor: PMI nondisplaced. Regular rate & rhythm. No rubs, gallops or murmurs. Lungs: Clear Abdomen: Obese,  nontender, nondistended. No hepatosplenomegaly. No bruits or masses. Good bowel sounds. Extremities: No cyanosis, clubbing, rash, 1+ LE edema Neuro: Alert & oriented x 3, cranial nerves grossly intact. Moves all 4 extremities w/o difficulty. Affect pleasant.  REDS: 27%  ASSESSMENT AND PLAN:   1. Chronic Diastolic Heart Failure:  - NICM, Echo 06/2017: EF 55-60%, grade 1 DD - Echo 03/19/18: EF 55-60%, grade 1 DD - Echo 01/2020 EF 55-60%. RV normal   - NYHA III, chronic confounded by obesity/ deconditioning.  Volume status ok. - Continue torsemide to 100 mg qhs/ 80 mg qpm.  - Continue carvedilol 12.5 mg bid. - OK to discharge from paramedicine. - Needs to do better w/ sodium reduction and fluid restriction   - BMET today.   2.  Uncontrolled DM:  - Improving, recent Hgb A1c was down to 8.7 (previously 11).  - Follows closely with Dr. Bubba Camp. Has an insulin pump.  3. CAD: history of CAD with BMS in 2007.  Low risk nuc study in 2018 - Stable w/o CP.  - Continue DAPT w/ ASA + Plavix.  - Continue ? blocker therapy. - Off statin due to previously elevated LFTs.   4. Morbid Obesity: - Body mass index is 42.32 kg/m.   5. HTN:  - Controlled. - Per paramedicine SBP at home visits run 110-140. Pt will start daily log and bring back to f/u visit. Aim to keep SBP 120-130 to help w/ renal perfusion, if low may need to scale back Coreg  - BMET today.  6. CKD Stage IV - Followed by Dr. Hollie Salk.  - New baseline now ~3.5 - BMET today.   7. Daytime fatigue - Completed sleep study. - No evidence of OSA   8.  S/p right transmetatarsal amputation - Followed by Dr Sharol Given.    9. Recurrent UTIs - UTI 3/22 + 4/22 treated w/ ceftriaxone - UTI 5/22. UC + for Enterobacter aerogenes. Treated w/ Keflex. Symptoms resolved  - Now on chronic abx for suppressive therapy.   F/u in 2 months with APP.  [place] FNP 11/29/2020 3:32 PM

## 2020-11-29 NOTE — Progress Notes (Signed)
Paramedicine Encounter   Patient ID: Susan [name] , female,   DOB: 1951-03-28,70 y.o.,  MRN: [id number]   Met patient in clinic today with provider.  Weight @ clinic-262 B/p-138/72 P-80 [id number]  Pt reports she got confused on her night time meds. Other than that she has been doing well with managing her own meds.  She added the fluoxetine BID instead of daily so she mistakenly did that.   Her pillbox was checked and one dose she had missed her mag in there so that was corrected and everything else looked good.  No med changes today.  Spoke to pt about being d/c today from paramedicine. All I can do I have and she has to do the rest with how she eats.  Dr. Collene Mares prescribed her ursodiol but she wants to stop taking it, so we told her to call her office back and see the effects of not taking it.    Patient is now discharged from [facility].  Patient has/has not met the following goals:  Yes :Patient expresses basic understanding of medications and what they are for Yes :Patient able to verbalize heart failure specific dietary/fluid restrictions Yes :Patient is aware of who to call if they have medical concerns or if they need to schedule or change appts Yes :Patient has a scale for daily weights and weighs regularly Yes :Patient able to verbalize concerning symptoms when they should call the HF clinic (weight gain ranges, etc) Yes :Patient has a PCP and has seen within the past year or has upcoming appt Yes :Patient has reliable access to getting their medications Yes :Patient has shown they are able to reorder medications reliably No :Patient has had admission in past 30 days- if yes how many? No :Patient has had admission in past 90 days- if yes how many?  Discharge Comments:      Marylouise Stacks, [place] 11/29/2020

## 2020-12-03 ENCOUNTER — Other Ambulatory Visit (HOSPITAL_COMMUNITY): Payer: Self-pay | Admitting: Cardiology

## 2020-12-10 ENCOUNTER — Other Ambulatory Visit: Payer: Self-pay | Admitting: Family Medicine

## 2020-12-13 ENCOUNTER — Telehealth (HOSPITAL_COMMUNITY): Payer: Self-pay | Admitting: *Deleted

## 2020-12-13 ENCOUNTER — Encounter: Payer: Self-pay | Admitting: Family Medicine

## 2020-12-13 ENCOUNTER — Ambulatory Visit (INDEPENDENT_AMBULATORY_CARE_PROVIDER_SITE_OTHER): Payer: HMO | Admitting: Family Medicine

## 2020-12-13 ENCOUNTER — Other Ambulatory Visit: Payer: Self-pay

## 2020-12-13 VITALS — BP 130/68 | HR 88 | Temp 97.9°F | Resp 16 | Ht 66.0 in | Wt 267.0 lb

## 2020-12-13 DIAGNOSIS — S90821A Blister (nonthermal), right foot, initial encounter: Secondary | ICD-10-CM

## 2020-12-13 DIAGNOSIS — S90821D Blister (nonthermal), right foot, subsequent encounter: Secondary | ICD-10-CM | POA: Diagnosis not present

## 2020-12-13 DIAGNOSIS — R739 Hyperglycemia, unspecified: Secondary | ICD-10-CM | POA: Diagnosis not present

## 2020-12-13 DIAGNOSIS — N184 Chronic kidney disease, stage 4 (severe): Secondary | ICD-10-CM | POA: Diagnosis not present

## 2020-12-13 DIAGNOSIS — T148XXA Other injury of unspecified body region, initial encounter: Secondary | ICD-10-CM

## 2020-12-13 DIAGNOSIS — Z794 Long term (current) use of insulin: Secondary | ICD-10-CM | POA: Diagnosis not present

## 2020-12-13 DIAGNOSIS — Z89431 Acquired absence of right foot: Secondary | ICD-10-CM | POA: Diagnosis not present

## 2020-12-13 NOTE — Telephone Encounter (Signed)
Pt called stating she is up 4lbs over night. Pt said she is " a little short of breath". No other complaints at this time. Pt asked if she should increase any medications.  Routed to [employer] for advice

## 2020-12-13 NOTE — Telephone Encounter (Signed)
Called pt no answer no vm 

## 2020-12-13 NOTE — Progress Notes (Signed)
Subjective:    Patient ID: Susan [name], female    DOB: June 22, 1950, 70 y.o.   MRN: [id number]  HPI  Patient has a large blood blister on the posterior right calcaneus.  It is roughly 3 cm in diameter.  It has been there since Thursday of last week.  It is nontender.  She denies any fever or warmth or pain.  Past Medical History:  Diagnosis Date   Acute MI ([place]) 1999; 2007   Anemia    hx   Anginal pain ([place])    Anxiety    ARF (acute renal failure) ([place]) 06/2017   [facility]   Arthritis    "generalized" (03/15/2014)   CAD (coronary artery disease)    MI in 2000 - MI  2007 - treated bare metal stent (no nuclear since then as 9/11)   Carotid artery disease (H[facility])    CHF (congestive heart failure) ([place])    Chronic diastolic heart failure ([place])    a) ECHO (08/2013) EF 55-60% and RV function nl b) RHC (08/2013) RA 4, RV 30/5/7, PA 25/10 (16), PCWP 7, Fick CO/CI 6.3/2.7, PVR 1.5 WU, PA 61 and 66%   Daily headache    "~ every other day; since I fell in June" (03/15/2014)   Depression    Dyslipidemia    Dyspnea    Exertional shortness of breath    HTN (hypertension)    Hypothyroidism    Neuropathy    Obesity    Osteoarthritis    Peripheral neuropathy    PONV (postoperative nausea and vomiting)    RBBB (right bundle branch block)    Old   Stroke ([place])    mini strokes   Syncope    likely due to [name] blood sugar   Tachycardia    Sinus tachycardia   Type II diabetes mellitus ([place])    Type II   Urinary incontinence    Venous insufficiency    Past Surgical History:  Procedure Laterality Date   ABDOMINAL HYSTERECTOMY  1980's   AMPUTATION Right 02/24/2018   Procedure: RIGHT FOOT GREAT TOE AND 2ND TOE AMPUTATION;  Surgeon: Newt Minion, MD;  Location: [place];  Service: Orthopedics;  Laterality: Right;   AMPUTATION Right 04/30/2018   Procedure: RIGHT TRANSMETATARSAL AMPUTATION;  Surgeon: Newt Minion, MD;  Location: [place];  Service: Orthopedics;  Laterality: Right;    BIOPSY  05/27/2020   Procedure: BIOPSY;  Surgeon: [name], DO;  [facility];  Service: Endoscopy;;   CATARACT EXTRACTION, BILATERAL Bilateral ?2013   COLONOSCOPY W/ POLYPECTOMY     COLONOSCOPY WITH PROPOFOL N/A 03/13/2019   Procedure: COLONOSCOPY WITH PROPOFOL;  Surgeon: Jerene Bears, MD;  Location: [place];  Service: Gastroenterology;  Laterality: N/A;   [place]; 2007   "1 + 1"   ESOPHAGOGASTRODUODENOSCOPY (EGD) WITH PROPOFOL N/A 03/13/2019   Procedure: ESOPHAGOGASTRODUODENOSCOPY (EGD) WITH PROPOFOL;  Surgeon: Jerene Bears, MD;  Location: [place];  Service: Gastroenterology;  Laterality: N/A;   ESOPHAGOGASTRODUODENOSCOPY (EGD) WITH PROPOFOL N/A 05/27/2020   Procedure: ESOPHAGOGASTRODUODENOSCOPY (EGD) WITH PROPOFOL;  Surgeon: [name], DO;  [facility];  Service: Endoscopy;  Laterality: N/A;   EYE SURGERY Bilateral    lazer   HEMOSTASIS CLIP PLACEMENT  03/13/2019   Procedure: HEMOSTASIS CLIP PLACEMENT;  Surgeon: Jerene Bears, MD;  Location: [facility] ENDOSCOPY;  Service: Gastroenterology;;   KNEE ARTHROSCOPY Left 10/25/2006   POLYPECTOMY  03/13/2019  Procedure: POLYPECTOMY;  Surgeon: Jerene Bears, MD;  Location: [place];  Service: Gastroenterology;;   RIGHT HEART CATH N/A 07/24/2017   Procedure: RIGHT HEART CATH;  Surgeon: Jolaine Artist, MD;  Location: [place] [facility];  Service: Cardiovascular;  Laterality: N/A;   RIGHT HEART CATHETERIZATION N/A 09/22/2013   Procedure: RIGHT HEART CATH;  Surgeon: Jolaine Artist, MD;  Location: [facility] CATH LAB;  Service: Cardiovascular;  Laterality: N/A;   SHOULDER ARTHROSCOPY WITH OPEN ROTATOR CUFF REPAIR Right 03/14/2014   Procedure: RIGHT SHOULDER ARTHROSCOPY WITH BICEPS RELEASE, OPEN SUBSCAPULA REPAIR, OPEN SUPRASPINATUS REPAIR.;  Surgeon: Meredith Pel, MD;  Location: [place];  Service: Orthopedics;  Laterality: Right;   TOE AMPUTATION Right 02/24/2018    GREAT TOE AND 2ND TOE AMPUTATION   TUBAL LIGATION  1970's   Current Outpatient Medications on File Prior to Visit  Medication Sig Dispense Refill   acetaminophen (TYLENOL) 500 MG tablet Take 1,000 mg by mouth every 6 (six) hours as needed for moderate pain or headache.     albuterol (VENTOLIN HFA) 108 (90 Base) MCG/ACT inhaler TAKE 2 PUFFS BY MOUTH EVERY 6 HOURS AS NEEDED FOR WHEEZE OR SHORTNESS OF BREATH 8.5 g 1   allopurinol (ZYLOPRIM) 100 MG tablet TAKE 1 TABLET BY MOUTH TWICE A DAY 180 tablet 4   Ascorbic Acid (VITAMIN C) 1000 MG tablet Take 1,000 mg by mouth daily.     aspirin EC 81 MG tablet Take 1 tablet (81 mg total) by mouth daily with breakfast. 30 tablet 11   carvedilol (COREG) 12.5 MG tablet TAKE 1 TABLET (12.5 MG TOTAL) BY MOUTH 2 (TWO) TIMES DAILY WITH A MEAL. 180 tablet 1   cephALEXin (KEFLEX) 250 MG capsule Take 1 capsule (250 mg total) by mouth daily. 30 capsule 2   cholecalciferol (VITAMIN D) 1000 units tablet Take 1,000 Units by mouth daily with supper.      clopidogrel (PLAVIX) 75 MG tablet TAKE 1 TABLET BY MOUTH DAILY WITH BREAKFAST. 90 tablet 3   ferrous sulfate 325 (65 FE) MG tablet Take 1 tablet (325 mg total) by mouth 2 (two) times daily with a meal. Reported on 08/21/2015 60 tablet 2   FLUoxetine (PROZAC) 20 MG capsule TAKE 1 CAPSULE BY MOUTH EVERY DAY 90 capsule 2   guaiFENesin-dextromethorphan (ROBITUSSIN DM) 100-10 MG/5ML syrup Take 10 mLs by mouth every 4 (four) hours as needed for cough. 118 mL 0   Insulin Disposable Pump (OMNIPOD DASH 5 PACK PODS) MISC Inject into the skin See admin instructions. Use continuously with Novolin R - change every 72 hours.     insulin NPH Human (HUMULIN N,NOVOLIN N) 100 UNIT/ML injection Inject 15 Units into the skin See admin instructions. Inject 15 units subcutaneously in the morning if CBG >300;     insulin regular (NOVOLIN R) 100 units/mL injection Inject into the skin See admin instructions. Manually add bolus to continuous dose via  OmniPod 3 times daily per sliding scale (CBG 80-150 7 units, 151-200 9 units, 201-250 12 units, 251-300 14 units, 301- 400 17 units)     levothyroxine (SYNTHROID, LEVOTHROID) 50 MCG tablet Take 1 tablet (50 mcg total) by mouth daily before breakfast. 90 tablet 1   magnesium oxide (MAG-OX) 400 MG tablet TAKE 1 TABLET BY MOUTH EVERY DAY 120 tablet 2   Multiple Vitamins-Minerals (AIRBORNE GUMMIES PO) Take 2 tablets by mouth every evening.     Multiple Vitamins-Minerals (CENTRUM SILVER 50+WOMEN) TABS Take 1 tablet by mouth every evening.  Multiple Vitamins-Minerals (ZINC PO) Take 1 tablet by mouth every evening.     nitroGLYCERIN (NITROSTAT) 0.4 MG SL tablet Place 1 tablet (0.4 mg total) under the tongue every 5 (five) minutes as needed for chest pain. 25 tablet 1   ONETOUCH VERIO test strip 3 (three) times daily.     pantoprazole (PROTONIX) 40 MG tablet Take 40 mg by mouth daily.     polyethylene glycol (MIRALAX / GLYCOLAX) 17 g packet Take 17 g by mouth daily. 14 each 0   pregabalin (LYRICA) 150 MG capsule Take 1 capsule (150 mg total) by mouth 2 (two) times daily. 60 capsule 2   torsemide (DEMADEX) 20 MG tablet Take 5 tablets (100 mg total) by mouth in the morning AND 4 tablets (80 mg total) every evening. 270 tablet 11   traZODone (DESYREL) 100 MG tablet Take 100 mg by mouth as needed for sleep.     No current facility-administered medications on file prior to visit.   Allergies  Allergen Reactions   Drug Ingredient [Cephalexin] Diarrhea    Pt reports heavy diarrhea with use of keflex   Codeine Nausea And Vomiting   Social History   Socioeconomic History   Marital status: Married    Spouse name: Not on file   Number of children: 3   Years of education: 12th   Highest education level: Not on file  Occupational History    Employer: UNEMPLOYED  Tobacco Use   Smoking status: Former    Packs/day: 3.00    Years: 32.00    Pack years: 96.00    Types: Cigarettes    Quit date: 10/24/1997     Years since quitting: 23.1   Smokeless tobacco: Never  Vaping Use   Vaping Use: Never used  Substance and Sexual Activity   Alcohol use: Not Currently    Comment: "might have 2-3 daiquiris in the summer"   Drug use: No   Sexual activity: Not Currently  Other Topics Concern   Not on file  Social History Narrative   Pt lives at home with her spouse.   Caffeine Use- 3 sodas daily   Social Determinants of Health   Financial Resource Strain: [name] Risk    Difficulty of Paying Living Expenses: Not very hard  Food Insecurity: No Food Insecurity   Worried About Charity fundraiser in the Last Year: Never true   Ran Out of Food in the Last Year: Never true  Transportation Needs: No Transportation Needs   Lack of Transportation (Medical): No   Lack of Transportation (Non-Medical): No  Physical Activity: Not on file  Stress: Not on file  Social Connections: Not on file  Intimate Partner Violence: Not on file     Review of Systems     Objective:   Physical Exam Constitutional:      General: She is not in acute distress.    Appearance: Normal appearance. She is obese. She is not ill-appearing or toxic-appearing.  Cardiovascular:     Rate and Rhythm: Normal rate and regular rhythm.     Heart sounds: Normal heart sounds. No murmur heard.   No friction rub. No gallop.  Pulmonary:     Effort: Pulmonary effort is normal. No respiratory distress.     Breath sounds: Normal breath sounds. No stridor. No wheezing, rhonchi or rales.  Abdominal:     General: Bowel sounds are normal.     Palpations: Abdomen is soft.  Musculoskeletal:     Right lower leg:  Deformity present. Edema present.     Left lower leg: Edema present.     Right foot: Deformity (TRANSMETATARSAL AMPUTATION) present. No tenderness.       Legs:  Neurological:     Mental Status: She is alert.          Assessment & Plan:  Blood blister I aspirated the contents of the blood blister with a 18-gauge needle and a  20 cc syringe.  I was able to completely empty the blood blister.  I then coated the wound with Silvadene, nonadherent gauze, and then wrapped the foot with Coban.  Recommended daily dressing changes.  Recheck next week.  Increase torsemide 100 mg twice daily until seen on Monday due to increased weight and edema

## 2020-12-15 DIAGNOSIS — I5032 Chronic diastolic (congestive) heart failure: Secondary | ICD-10-CM | POA: Diagnosis not present

## 2020-12-17 ENCOUNTER — Ambulatory Visit: Payer: HMO | Admitting: Nurse Practitioner

## 2020-12-17 ENCOUNTER — Encounter: Payer: Self-pay | Admitting: Family Medicine

## 2020-12-17 ENCOUNTER — Other Ambulatory Visit: Payer: Self-pay

## 2020-12-17 ENCOUNTER — Ambulatory Visit (INDEPENDENT_AMBULATORY_CARE_PROVIDER_SITE_OTHER): Payer: HMO | Admitting: Family Medicine

## 2020-12-17 VITALS — BP 134/70 | HR 80 | Temp 98.0°F | Resp 16 | Ht 66.0 in | Wt 266.0 lb

## 2020-12-17 DIAGNOSIS — T148XXA Other injury of unspecified body region, initial encounter: Secondary | ICD-10-CM

## 2020-12-17 DIAGNOSIS — S90821D Blister (nonthermal), right foot, subsequent encounter: Secondary | ICD-10-CM

## 2020-12-17 NOTE — Progress Notes (Signed)
Subjective:    Patient ID: Susan [name], female    DOB: [birth year], 70 y.o.   MRN: [id number]  HPI 12/13/20 Patient has a large blood blister on the posterior right calcaneus.  It is roughly 3 cm in diameter.  It has been there since Thursday of last week.  It is nontender.  She denies any fever or warmth or pain.  At that time, my plan was: I aspirated the contents of the blood blister with a 18-gauge needle and a 20 cc syringe.  I was able to completely empty the blood blister.  I then coated the wound with Silvadene, nonadherent gauze, and then wrapped the foot with Coban.  Recommended daily dressing changes.  Recheck next week.  Increase torsemide 100 mg twice daily until seen on Monday due to increased weight and edema  12/17/20 Here today for recheck.  The wound looks outstanding.  The blister has disappeared and now there is just a circular demarcation of pink healing skin on the posterior calcaneus.  There is no residual blood.  There is no evidence of cellulitis.  There is no pus.  There is no warmth or tenderness  Past Medical History:  Diagnosis Date   Acute MI ([place]) 1999; 2007   Anemia    hx   Anginal pain (H[facility])    Anxiety    ARF (acute renal failure) ([place]) 06/2017   [facility]   Arthritis    "generalized" (03/15/2014)   CAD (coronary artery disease)    MI in 2000 - MI  2007 - treated bare metal stent (no nuclear since then as 9/11)   Carotid artery disease (H[facility])    CHF (congestive heart failure) ([place])    Chronic diastolic heart failure ([place])    a) ECHO (08/2013) EF 55-60% and RV function nl b) RHC (08/2013) RA 4, RV 30/5/7, PA 25/10 (16), PCWP 7, Fick CO/CI 6.3/2.7, PVR 1.5 WU, PA 61 and 66%   Daily headache    "~ every other day; since I fell in June" (03/15/2014)   Depression    Dyslipidemia    Dyspnea    Exertional shortness of breath    HTN (hypertension)    Hypothyroidism    Neuropathy    Obesity    Osteoarthritis    Peripheral neuropathy    PONV  (postoperative nausea and vomiting)    RBBB (right bundle branch block)    Old   Stroke ([place])    mini strokes   Syncope    likely due to low blood sugar   Tachycardia    Sinus tachycardia   Type II diabetes mellitus ([place])    Type II   Urinary incontinence    Venous insufficiency    Past Surgical History:  Procedure Laterality Date   ABDOMINAL HYSTERECTOMY  1980's   AMPUTATION Right 02/24/2018   Procedure: RIGHT FOOT GREAT TOE AND 2ND TOE AMPUTATION;  Surgeon: Newt Minion, MD;  Location: [place];  Service: Orthopedics;  Laterality: Right;   AMPUTATION Right 04/30/2018   Procedure: RIGHT TRANSMETATARSAL AMPUTATION;  Surgeon: Newt Minion, MD;  Location: [place];  Service: Orthopedics;  Laterality: Right;   BIOPSY  05/27/2020   Procedure: BIOPSY;  Surgeon: [name], DO;  [facility];  Service: Endoscopy;;   CATARACT EXTRACTION, BILATERAL Bilateral ?2013   COLONOSCOPY W/ POLYPECTOMY     COLONOSCOPY WITH PROPOFOL N/A 03/13/2019   Procedure: COLONOSCOPY WITH PROPOFOL;  Surgeon: Jerene Bears, MD;  Location:  [place] [facility];  Service: Gastroenterology;  Laterality: N/A;   [place]; 2007   "1 + 1"   ESOPHAGOGASTRODUODENOSCOPY (EGD) WITH PROPOFOL N/A 03/13/2019   Procedure: ESOPHAGOGASTRODUODENOSCOPY (EGD) WITH PROPOFOL;  Surgeon: Jerene Bears, MD;  Location: [place];  Service: Gastroenterology;  Laterality: N/A;   ESOPHAGOGASTRODUODENOSCOPY (EGD) WITH PROPOFOL N/A 05/27/2020   Procedure: ESOPHAGOGASTRODUODENOSCOPY (EGD) WITH PROPOFOL;  Surgeon: [name], DO;  [facility];  Service: Endoscopy;  Laterality: N/A;   EYE SURGERY Bilateral    lazer   HEMOSTASIS CLIP PLACEMENT  03/13/2019   Procedure: HEMOSTASIS CLIP PLACEMENT;  Surgeon: Jerene Bears, MD;  Location[facility]COPY;  Service: Gastroenterology;;   KNEE ARTHROSCOPY Left 10/25/2006   POLYPECTOMY  03/13/2019   Procedure: POLYPECTOMY;  Surgeon: Jerene Bears, MD;  Location: [place];  Service: Gastroenterology;;   RIGHT HEART CATH N/A 07/24/2017   Procedure: RIGHT HEART CATH;  Surgeon: Jolaine Artist, MD;  Location: [place] [facility];  Service: Cardiovascular;  Laterality: N/A;   RIGHT HEART CATHETERIZATION N/A 09/22/2013   Procedure: RIGHT HEART CATH;  Surgeon: Jolaine Artist, MD;  Location: [facility] CATH LAB;  Service: Cardiovascular;  Laterality: N/A;   SHOULDER ARTHROSCOPY WITH OPEN ROTATOR CUFF REPAIR Right 03/14/2014   Procedure: RIGHT SHOULDER ARTHROSCOPY WITH BICEPS RELEASE, OPEN SUBSCAPULA REPAIR, OPEN SUPRASPINATUS REPAIR.;  Surgeon: Meredith Pel, MD;  Location: [place];  Service: Orthopedics;  Laterality: Right;   TOE AMPUTATION Right 02/24/2018   GREAT TOE AND 2ND TOE AMPUTATION   TUBAL LIGATION  1970's   Current Outpatient Medications on File Prior to Visit  Medication Sig Dispense Refill   acetaminophen (TYLENOL) 500 MG tablet Take 1,000 mg by mouth every 6 (six) hours as needed for moderate pain or headache.     albuterol (VENTOLIN HFA) 108 (90 Base) MCG/ACT inhaler TAKE 2 PUFFS BY MOUTH EVERY 6 HOURS AS NEEDED FOR WHEEZE OR SHORTNESS OF BREATH 8.5 g 1   allopurinol (ZYLOPRIM) 100 MG tablet TAKE 1 TABLET BY MOUTH TWICE A DAY 180 tablet 4   Ascorbic Acid (VITAMIN C) 1000 MG tablet Take 1,000 mg by mouth daily.     aspirin EC 81 MG tablet Take 1 tablet (81 mg total) by mouth daily with breakfast. 30 tablet 11   carvedilol (COREG) 12.5 MG tablet TAKE 1 TABLET (12.5 MG TOTAL) BY MOUTH 2 (TWO) TIMES DAILY WITH A MEAL. 180 tablet 1   cephALEXin (KEFLEX) 250 MG capsule Take 1 capsule (250 mg total) by mouth daily. 30 capsule 2   cholecalciferol (VITAMIN D) 1000 units tablet Take 1,000 Units by mouth daily with supper.      clopidogrel (PLAVIX) 75 MG tablet TAKE 1 TABLET BY MOUTH DAILY WITH BREAKFAST. 90 tablet 3   ferrous sulfate 325 (65 FE) MG tablet Take 1 tablet (325 mg total) by mouth 2 (two) times daily with a meal.  Reported on 08/21/2015 60 tablet 2   FLUoxetine (PROZAC) 20 MG capsule TAKE 1 CAPSULE BY MOUTH EVERY DAY 90 capsule 2   guaiFENesin-dextromethorphan (ROBITUSSIN DM) 100-10 MG/5ML syrup Take 10 mLs by mouth every 4 (four) hours as needed for cough. 118 mL 0   Insulin Disposable Pump (OMNIPOD DASH 5 PACK PODS) MISC Inject into the skin See admin instructions. Use continuously with Novolin R - change every 72 hours.     insulin NPH Human (HUMULIN N,NOVOLIN N) 100 UNIT/ML injection Inject 15 Units into the skin See admin  instructions. Inject 15 units subcutaneously in the morning if CBG >300;     insulin regular (NOVOLIN R) 100 units/mL injection Inject into the skin See admin instructions. Manually add bolus to continuous dose via OmniPod 3 times daily per sliding scale (CBG 80-150 7 units, 151-200 9 units, 201-250 12 units, 251-300 14 units, 301- 400 17 units)     levothyroxine (SYNTHROID, LEVOTHROID) 50 MCG tablet Take 1 tablet (50 mcg total) by mouth daily before breakfast. 90 tablet 1   magnesium oxide (MAG-OX) 400 MG tablet TAKE 1 TABLET BY MOUTH EVERY DAY 120 tablet 2   Multiple Vitamins-Minerals (AIRBORNE GUMMIES PO) Take 2 tablets by mouth every evening.     Multiple Vitamins-Minerals (CENTRUM SILVER 50+WOMEN) TABS Take 1 tablet by mouth every evening.     Multiple Vitamins-Minerals (ZINC PO) Take 1 tablet by mouth every evening.     nitroGLYCERIN (NITROSTAT) 0.4 MG SL tablet Place 1 tablet (0.4 mg total) under the tongue every 5 (five) minutes as needed for chest pain. 25 tablet 1   ONETOUCH VERIO test strip 3 (three) times daily.     pantoprazole (PROTONIX) 40 MG tablet Take 40 mg by mouth daily.     polyethylene glycol (MIRALAX / GLYCOLAX) 17 g packet Take 17 g by mouth daily. 14 each 0   pregabalin (LYRICA) 150 MG capsule Take 1 capsule (150 mg total) by mouth 2 (two) times daily. 60 capsule 2   torsemide (DEMADEX) 20 MG tablet Take 5 tablets (100 mg total) by mouth in the morning AND 4  tablets (80 mg total) every evening. 270 tablet 11   traZODone (DESYREL) 100 MG tablet Take 100 mg by mouth as needed for sleep.     ursodiol (ACTIGALL) 500 MG tablet Take 500 mg by mouth 3 (three) times daily.     No current facility-administered medications on file prior to visit.   Allergies  Allergen Reactions   Drug Ingredient [Cephalexin] Diarrhea    Pt reports heavy diarrhea with use of keflex   Codeine Nausea And Vomiting   Social History   Socioeconomic History   Marital status: Married    Spouse name: Not on file   Number of children: 3   Years of education: 12th   Highest education level: Not on file  Occupational History    Employer: UNEMPLOYED  Tobacco Use   Smoking status: Former    Packs/day: 3.00    Years: 32.00    Pack years: 96.00    Types: Cigarettes    Quit date: 10/24/1997    Years since quitting: 23.1   Smokeless tobacco: Never  Vaping Use   Vaping Use: Never used  Substance and Sexual Activity   Alcohol use: Not Currently    Comment: "might have 2-3 daiquiris in the summer"   Drug use: No   Sexual activity: Not Currently  Other Topics Concern   Not on file  Social History Narrative   Pt lives at home with her spouse.   Caffeine Use- 3 sodas daily   Social Determinants of Health   Financial Resource Strain: Low Risk    Difficulty of Paying Living Expenses: Not very hard  Food Insecurity: No Food Insecurity   Worried About Charity fundraiser in the Last Year: Never true   Ran Out of Food in the Last Year: Never true  Transportation Needs: No Transportation Needs   Lack of Transportation (Medical): No   Lack of Transportation (Non-Medical): No  Physical Activity: Not  on file  Stress: Not on file  Social Connections: Not on file  Intimate Partner Violence: Not on file     Review of Systems     Objective:   Physical Exam Constitutional:      General: She is not in acute distress.    Appearance: Normal appearance. She is obese. She  is not ill-appearing or toxic-appearing.  Cardiovascular:     Rate and Rhythm: Normal rate and regular rhythm.     Heart sounds: Normal heart sounds. No murmur heard.   No friction rub. No gallop.  Pulmonary:     Effort: Pulmonary effort is normal. No respiratory distress.     Breath sounds: Normal breath sounds. No stridor. No wheezing, rhonchi or rales.  Abdominal:     General: Bowel sounds are normal.     Palpations: Abdomen is soft.  Musculoskeletal:     Right lower leg: Deformity present. Edema present.     Left lower leg: Edema present.     Right foot: Deformity (TRANSMETATARSAL AMPUTATION) present. No tenderness.       Legs:  Neurological:     Mental Status: She is alert.          Assessment & Plan:  Blood blister Has healed virtually 95%.  Recommended continue dressing changes for the remainder of this week and then resume normal behavior.  She can reduce her torsemide back to 5 tablets in the morning and 4 tablets in the evening as the swelling is back to her baseline

## 2021-01-07 ENCOUNTER — Ambulatory Visit: Payer: HMO | Admitting: Orthopedic Surgery

## 2021-01-07 ENCOUNTER — Other Ambulatory Visit: Payer: Self-pay

## 2021-01-07 ENCOUNTER — Encounter: Payer: Self-pay | Admitting: Orthopedic Surgery

## 2021-01-07 ENCOUNTER — Other Ambulatory Visit: Payer: Self-pay | Admitting: Family Medicine

## 2021-01-07 VITALS — Ht 65.0 in | Wt 271.0 lb

## 2021-01-07 DIAGNOSIS — I87333 Chronic venous hypertension (idiopathic) with ulcer and inflammation of bilateral lower extremity: Secondary | ICD-10-CM

## 2021-01-07 DIAGNOSIS — L97929 Non-pressure chronic ulcer of unspecified part of left lower leg with unspecified severity: Secondary | ICD-10-CM | POA: Diagnosis not present

## 2021-01-07 DIAGNOSIS — Z89431 Acquired absence of right foot: Secondary | ICD-10-CM

## 2021-01-07 DIAGNOSIS — L97919 Non-pressure chronic ulcer of unspecified part of right lower leg with unspecified severity: Secondary | ICD-10-CM | POA: Diagnosis not present

## 2021-01-07 DIAGNOSIS — N390 Urinary tract infection, site not specified: Secondary | ICD-10-CM

## 2021-01-07 DIAGNOSIS — B351 Tinea unguium: Secondary | ICD-10-CM

## 2021-01-08 ENCOUNTER — Encounter: Payer: Self-pay | Admitting: Orthopedic Surgery

## 2021-01-08 NOTE — Progress Notes (Signed)
Office Visit Note   Patient: Susan [name]           Date of Birth: [birth year]           MRN: [id number] Visit Date: 01/07/2021              Requested by: [name], MD [address] [place],  [place] [address] PCP: [name], MD  Chief Complaint  Patient presents with   Left Foot - Follow-up    Toenail trim   Right Leg - Pain      HPI: Patient is a 70 year old woman who presents for both lower extremities status post transmetatarsal on the right with thickened discolored onychomycotic nails on the left which she is unable to safely trim as well as chronic venous insufficiency with recurrent ulcerations.  Assessment & Plan: Visit Diagnoses:  1. Idiopathic chronic venous hypertension of both lower extremities with ulcer and inflammation ([place])   2. History of transmetatarsal amputation of right foot ([place])   3. Onychomycosis     Plan: Recommended patient wear the extra-large compression stocking she is currently wearing the double extra-large.  The socks were applied for her.  For her legs recommended exercise elevation and compression.  Follow-Up Instructions: Return in about 3 months (around 04/09/2021).   Ortho Exam  Patient is alert, oriented, no adenopathy, well-dressed, normal affect, normal respiratory effort. Examination patient's right transmetatarsal amputation is stable there is no ulceration no cellulitis.  Examination of both legs she has brawny skin color changes with venous insufficiency and a stable ulcer on the right leg 1 cm in diameter 1 mm deep.  She has thickened discolored onychomycotic nails on the left with no cellulitis no abscess.  The nails were trimmed x5 without complications.  Her calf measures 45 cm in circumference.  Imaging: No results found. No images are attached to the encounter.  Labs: Lab Results  Component Value Date   HGBA1C 10.3 (H) 08/26/2020   HGBA1C 11.0 (H) 08/14/2020   HGBA1C 8.7 (H) 05/17/2020   CRP 0.7  08/22/2020   CRP 1.5 (H) 03/13/2020   CRP 1.9 (H) 03/12/2020   LABURIC 4.7 08/09/2020   LABURIC 9.1 (H) 10/12/2018   LABURIC 9.0 (H) 03/06/2018   REPTSTATUS 09/07/2020 FINAL 09/04/2020   CULT >=100,000 COLONIES/mL ENTEROBACTER AEROGENES (A) 09/04/2020   LABORGA ENTEROBACTER AEROGENES (A) 09/04/2020     Lab Results  Component Value Date   ALBUMIN 2.8 (L) 08/22/2020   ALBUMIN 3.2 (L) 08/21/2020   ALBUMIN 2.3 (L) 05/28/2020    Lab Results  Component Value Date   MG 2.2 08/22/2020   MG 1.9 08/19/2020   MG 1.8 08/18/2020   No results found for: VD25OH  No results found for: PREALBUMIN CBC EXTENDED Latest Ref Rng & Units 08/26/2020 08/25/2020 08/24/2020  WBC 4.0 - 10.5 K/uL 5.5 5.4 5.9  RBC 3.87 - 5.11 MIL/uL 4.48 4.19 3.95  HGB 12.0 - 15.0 g/dL 12.1 11.3(L) 10.4(L)  HCT 36.0 - 46.0 % 38.5 37.6 33.6(L)  PLT 150 - 400 K/uL 235 192 172  NEUTROABS 1.7 - 7.7 K/uL - - -  LYMPHSABS 0.7 - 4.0 K/uL - - -     Body mass index is 45.1 kg/m.  Orders:  No orders of the defined types were placed in this encounter.  No orders of the defined types were placed in this encounter.    Procedures: No procedures performed  Clinical Data: No additional findings.  ROS:  All  other systems negative, except as noted in the HPI. Review of Systems  Objective: Vital Signs: Ht 5\' 5"  (1.651 m)   Wt 271 lb (122.9 kg)   BMI 45.10 kg/m   Specialty Comments:  No specialty comments available.  PMFS History: Patient Active Problem List   Diagnosis Date Noted   Acute renal failure superimposed on stage 4 chronic kidney disease ([place]) 08/22/2020   Hypoalbuminemia 05/25/2020   GERD (gastroesophageal reflux disease) 05/25/2020   Pressure injury of skin 05/17/2020   Type 2 diabetes mellitus with diabetic polyneuropathy, with long-term current use of insulin ([place]) 03/07/2020   Obesity, Class III, BMI 40-49.9 (morbid obesity) ([place]) 03/07/2020   Common bile duct (CBD) obstruction 05/28/2019    Benign neoplasm of ascending colon    Benign neoplasm of transverse colon    Benign neoplasm of descending colon    Benign neoplasm of sigmoid colon    Gastric polyps    Hyperkalemia 03/11/2019   Prolonged QT interval 03/11/2019   Onychomycosis 06/21/2018   Osteomyelitis of second toe of right foot ([place])    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 (H[facility])    PVD (peripheral vascular disease) ([place]) 10/26/2017   E-coli UTI 07/27/2017   Hypothyroidism 07/27/2017   PAH (pulmonary artery hypertension) ([facility])    Impaired ambulation 07/19/2017   Leg cramps 02/27/2017   Peripheral edema 01/12/2017   Diabetic neuropathy ([place]) 11/12/2016   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 mellitus ([place]) 10/24/2015   Anemia 10/03/2015   Generalized anxiety disorder 10/03/2015   Insomnia 10/03/2015   Hyperglycemia due to diabetes mellitus ([place]) 06/07/2015   Chronic diastolic CHF (congestive heart failure) ([place]) 06/07/2015   Non compliance with medical treatment 04/17/2014   Rotator cuff tear 03/14/2014   Class 3 obesity 09/23/2013   Hypotension 12/25/2012   Urinary incontinence    MDD (major depressive disorder) 11/12/2010   RBBB (right bundle branch block)    Coronary artery disease involving native coronary artery of native heart without angina pectoris    Hyperlipemia 01/22/2009   Essential hypertension 01/22/2009   Past Medical History:  Diagnosis Date   Acute MI ([place]) 1999; 2007   Anemia    hx   Anginal pain ([place])    Anxiety    ARF (acute renal failure) ([place]) 06/2017   [facility] Asso   Arthritis    "generalized" (03/15/2014)   CAD (coronary artery disease)    MI in 2000 - MI  2007 - treated bare metal stent (no nuclear since then as 9/11)   Carotid artery disease ([place])    CHF (congestive heart failure) ([place])    Chronic diastolic heart failure ([place])    a) ECHO (08/2013) EF 55-60% and RV function nl b) RHC (08/2013) RA 4, RV 30/5/7, PA 25/10 (16), PCWP 7, Fick CO/CI  6.3/2.7, PVR 1.5 WU, PA 61 and 66%   Daily headache    "~ every other day; since I fell in June" (03/15/2014)   Depression    Dyslipidemia    Dyspnea    Exertional shortness of breath    HTN (hypertension)    Hypothyroidism    Neuropathy    Obesity    Osteoarthritis    Peripheral neuropathy    PONV (postoperative nausea and vomiting)    RBBB (right bundle branch block)    Old   Stroke ([place])    mini strokes   Syncope    likely due to low blood sugar  Tachycardia    Sinus tachycardia   Type II diabetes mellitus (H[facility])    Type II   Urinary incontinence    Venous insufficiency     Family History  Problem Relation Age of Onset   Heart attack Mother 77    Past Surgical History:  Procedure Laterality Date   ABDOMINAL HYSTERECTOMY  1980's   AMPUTATION Right 02/24/2018   Procedure: RIGHT FOOT GREAT TOE AND 2ND TOE AMPUTATION;  Surgeon: Newt Minion, MD;  Location: [facility];  Service: Orthopedics;  Laterality: Right;   AMPUTATION Right 04/30/2018   Procedure: RIGHT TRANSMETATARSAL AMPUTATION;  Surgeon: Newt Minion, MD;  Location: [facility];  Service: Orthopedics;  Laterality: Right;   BIOPSY  05/27/2020   Procedure: BIOPSY;  Surgeon: [name], DO;  [facility];  Service: Endoscopy;;   CATARACT EXTRACTION, BILATERAL Bilateral ?2013   COLONOSCOPY W/ POLYPECTOMY     COLONOSCOPY WITH PROPOFOL N/A 03/13/2019   Procedure: COLONOSCOPY WITH PROPOFOL;  Surgeon: Jerene Bears, MD;  Location: [facility];  Service: Gastroenterology;  Laterality: N/A;   [place]; 2007   "1 + 1"   ESOPHAGOGASTRODUODENOSCOPY (EGD) WITH PROPOFOL N/A 03/13/2019   Procedure: ESOPHAGOGASTRODUODENOSCOPY (EGD) WITH PROPOFOL;  Surgeon: Jerene Bears, MD;  Location: [facility];  Service: Gastroenterology;  Laterality: N/A;   ESOPHAGOGASTRODUODENOSCOPY (EGD) WITH PROPOFOL N/A 05/27/2020   Procedure: ESOPHAGOGASTRODUODENOSCOPY (EGD) WITH PROPOFOL;  Surgeon:  [name], DO;  [facility];  Service: Endoscopy;  Laterality: N/A;   EYE SURGERY Bilateral    [facility]   HEMOSTASIS CLIP PLACEMENT  03/13/2019   Procedure: HEMOSTASIS CLIP PLACEMENT;  Surgeon: Jerene Bears, MD;  [facility];  Service: Gastroenterology;;   KNEE ARTHROSCOPY Left 10/25/2006   POLYPECTOMY  03/13/2019   Procedure: POLYPECTOMY;  Surgeon: Jerene Bears, MD;  Location: [facility];  Service: Gastroenterology;;   RIGHT HEART CATH N/A 07/24/2017   Procedure: RIGHT HEART CATH;  Surgeon: Jolaine Artist, MD;  Location: [facility];  Service: Cardiovascular;  Laterality: N/A;   RIGHT HEART CATHETERIZATION N/A 09/22/2013   Procedure: RIGHT HEART CATH;  Surgeon: Jolaine Artist, MD;  Location: [facility];  Service: Cardiovascular;  Laterality: N/A;   SHOULDER ARTHROSCOPY WITH OPEN ROTATOR CUFF REPAIR Right 03/14/2014   Procedure: RIGHT SHOULDER ARTHROSCOPY WITH BICEPS RELEASE, OPEN SUBSCAPULA REPAIR, OPEN SUPRASPINATUS REPAIR.;  Surgeon: Meredith Pel, MD;  Location: [facility];  Service: Orthopedics;  Laterality: Right;   TOE AMPUTATION Right 02/24/2018   GREAT TOE AND 2ND TOE AMPUTATION   TUBAL LIGATION  1970's   Social History   Occupational History    Employer: UNEMPLOYED  Tobacco Use   Smoking status: Former    Packs/day: 3.00    Years: 32.00    Pack years: 96.00    Types: Cigarettes    Quit date: 10/24/1997    Years since quitting: 23.2   Smokeless tobacco: Never  Vaping Use   Vaping Use: Never used  Substance and Sexual Activity   Alcohol use: Not Currently    Comment: "might have 2-3 daiquiris in the summer"   Drug use: No   Sexual activity: Not Currently

## 2021-01-09 DIAGNOSIS — H43392 Other vitreous opacities, left eye: Secondary | ICD-10-CM | POA: Diagnosis not present

## 2021-01-09 DIAGNOSIS — H43813 Vitreous degeneration, bilateral: Secondary | ICD-10-CM | POA: Diagnosis not present

## 2021-01-09 DIAGNOSIS — E113513 Type 2 diabetes mellitus with proliferative diabetic retinopathy with macular edema, bilateral: Secondary | ICD-10-CM | POA: Diagnosis not present

## 2021-01-15 ENCOUNTER — Other Ambulatory Visit (HOSPITAL_COMMUNITY): Payer: Self-pay | Admitting: Cardiology

## 2021-01-15 DIAGNOSIS — I5032 Chronic diastolic (congestive) heart failure: Secondary | ICD-10-CM | POA: Diagnosis not present

## 2021-01-16 DIAGNOSIS — D631 Anemia in chronic kidney disease: Secondary | ICD-10-CM | POA: Diagnosis not present

## 2021-01-16 DIAGNOSIS — E1122 Type 2 diabetes mellitus with diabetic chronic kidney disease: Secondary | ICD-10-CM | POA: Diagnosis not present

## 2021-01-16 DIAGNOSIS — I509 Heart failure, unspecified: Secondary | ICD-10-CM | POA: Diagnosis not present

## 2021-01-16 DIAGNOSIS — N2581 Secondary hyperparathyroidism of renal origin: Secondary | ICD-10-CM | POA: Diagnosis not present

## 2021-01-16 DIAGNOSIS — I129 Hypertensive chronic kidney disease with stage 1 through stage 4 chronic kidney disease, or unspecified chronic kidney disease: Secondary | ICD-10-CM | POA: Diagnosis not present

## 2021-01-16 DIAGNOSIS — N184 Chronic kidney disease, stage 4 (severe): Secondary | ICD-10-CM | POA: Diagnosis not present

## 2021-01-16 DIAGNOSIS — Z89431 Acquired absence of right foot: Secondary | ICD-10-CM | POA: Diagnosis not present

## 2021-01-16 DIAGNOSIS — B379 Candidiasis, unspecified: Secondary | ICD-10-CM | POA: Diagnosis not present

## 2021-01-29 ENCOUNTER — Telehealth: Payer: Self-pay | Admitting: *Deleted

## 2021-01-29 NOTE — Chronic Care Management (AMB) (Signed)
  Chronic Care Management   Note  01/29/2021 Name: Chrishelle Zito MRN: [id number] DOB: 02-11-1951  Timmia Cogburn is a 70 y.o. year old female who is a primary care patient of [name], MD. I reached out to [name] by phone today in response to a referral sent by Ms. Vernell Barrier PCP, Dr. Dennard Schaumann.      Ms. Dick was given information about Chronic Care Management services today including:  CCM service includes personalized support from designated clinical staff supervised by her physician, including individualized plan of care and coordination with other care providers 24/7 contact phone numbers for assistance for urgent and routine care needs. Service will only be billed when office clinical staff spend 20 minutes or more in a month to coordinate care. Only one practitioner may furnish and bill the service in a calendar month. The patient may stop CCM services at any time (effective at the end of the month) by phone call to the office staff. The patient will be responsible for cost sharing (co-pay) of up to 20% of the service fee (after annual deductible is met).  Patient agreed to services and verbal consent obtained.   Follow up plan: Telephone appointment with care management team member scheduled for:02/04/21  [facility]  Direct Dial: [phone]

## 2021-01-31 NOTE — Progress Notes (Signed)
Advanced Heart Failure Clinic Note   Primary Physician [name], MD HF Cardiologist: Glori Bickers, MD  Nephrology: Dr Hollie Salk  Reason for Visit:  F/u for Chronic Diastolic Heart Failure  HPI:  Susan [name] is a 70 y.o. female with a history of RBBB, DM, Hypothyroidish, diastolic heart failure, CAD MI 2000/2007 BMS 2007, TIA, obesity, CKD Stage IV and  Rt transmetatarsal amputation.    Admitted June 1st through June 3rd, 2017 with altered mental status, hypothermia, and weakness. Diuretics initially held and later restarted.    Admitted 2/27-07/28/17 for AKI. Torsemide was held and restarted at 60 mg BID. She had RHC 07/24/17 that showed mild PAH with evidence of RV strain likely due to OHS/OSA. She was discharged to SNF.    Admitted 08/1658 with A/C diastolic HF. Diuresed with IV lasix.    Admitted 10/24-10/27/19 with syncope. She was orthostatic. Had AKI with creatinine up to 3.33 and required IVF. Renal US showed no hydronephrosis. Losartan and Imdur were DC'd. Creatinine 1.76 on day of DC.   Was admitted 1/21 for confusion and abdominal [name]. Found to have Klebsiella pneumoniae UTI treated w/ abx. Also with elevated LFTs. CT of abdomen showed cholelithiasis. MRCP showed cholelithiasis without evidence of cholecystitis or choledocholithiasis. Abdominal US with doppler showed no acute finding.  No concern for cholangitis. All viral serologies negative. Her Crestor was discontinued. She had outpatient GI f/u and continued work-up including a liver biopsy 08/11/19. No definite pathologic fibrosis noted in pathology report.   Admitted to [facility] 05/17/20 with N/V/D in the setting of possible gastroparesis.    Admitted to [facility] 05/24/20 with and A/C diastolic heart failure. Diuresed with IV lasix and transitioned to torsemide 80 mg twice a day.   She returned for HF follow up 1/22. Stressed out about taking care of her disabled son. SOB with exertion. + Orthopnea. Eating fast food. Not  weighing at home. Taking all medications. Lives with her husband and disabled son. She was instructed to increase torsemide to 100 mg bid x 2 days, then back down to 80 mg bid.  Had return f/u again on 2/11 and was still taking 100 of torsemide bid and had not reduced back down to 80 bid as previously directed. Labs showed AKI, w/ SCr rising from baseline of 1.9>>2.4. Was advised to reduce torsemide to 80 mg once daily x 3 days, then return to 80 mg bid.   Had home sleep study 07/25/20 and this was negative for OSA.   08/14/20, she was admitted to [facility] with AKI and progressive volume overloaded in setting of recurrent UTI and probable ATN. SCr rose to 4.1. She was placed on IV abx and IV lasix w/ improved renal function and volume status. SCr was down to 1.78 day of d/c. PT had recommended SNF at d/c however pt declined. She was discharged home on 3/27 but readmitted 2 days later for urinary retention and recurrent AKI. SCr had increased back up to 3.1. UA showed persistent UTI. She was placed back on IV abx.  CT without hydronephrosis. SCr improved w/ tx of UTI. SCr back down to 1.7 on day of d/c and she was started back on PO torsemide but discharged home on 80 mg once daily (previous dose was 80 bid). She was discharged home on 4/4.    She was seen back for post hospital f/u on 09/04/20 and she was mildly fluid overloaded. Wt was up 7 lb from d/c wt and she reported dysuria.  UA + for UTI. UC + for Enterobacter aerogenes. She was started on 10 day course of Keflex and increased torsemide to 80 mg qhs/ 40 mg qpm. Advised she f/u w/ PCP for further management of recurrent UTIs for potential long term suppressive abx therapy.   Seen in clinic 6/22, volume up, weight up 12 lbs, eating more salty foods. Torsemide increased. Stable NYHA III symptoms.  Today she returns for HF follow up. Feels about the same, breathing is OK but SOB with walking further distances on flat ground. Has some swelling in legs today and  a cough. Denies CP, dizziness,or PND/Orthopnea. Chronically sleeps in recliner. Appetite ok. No fever or chills. Weight at home 269 pounds. Taking all medications. Drinking a lot of fluids.  Cardiac Studies: Echo (9/21): EF 55-60%, RV ok Echo (10/19): EF 55-60%, Grade I DD  RHC (07/24/17): showed mild PAH with evidence of RV strain likely due to OHS/OSA.  Findings: RA = 17 RV = 48/15 PA = 49/11 (30) PCW = 18 Fick cardiac output/index = 7.0/3.0 PVR = 0.5 WU Ao sat = 97% PA sat = 62%, 64% Assessment: 1. Normal left-sided pressures 2. Mild PAH with evidence of RV strain likely due to OHS/OSA 3. Normal cardiac output  Lexiscan w/ stress echo (2018): EF 68%, low risk  LHC (2007): BMS distal RCA Cardiolite (03/2002): EF of 51% but no ischemia LHC (2000): BMS RCA   ROS: All systems reviewed and negative except as per HPI.   Current Meds  Medication Sig   acetaminophen (TYLENOL) 500 MG tablet Take 1,000 mg by mouth every 6 (six) hours as needed for moderate [name] or headache.   albuterol (VENTOLIN HFA) 108 (90 Base) MCG/ACT inhaler TAKE 2 PUFFS BY MOUTH EVERY 6 HOURS AS NEEDED FOR WHEEZE OR SHORTNESS OF BREATH   allopurinol (ZYLOPRIM) 100 MG tablet TAKE 1 TABLET BY MOUTH TWICE A DAY   Ascorbic Acid (VITAMIN C) 1000 MG tablet Take 1,000 mg by mouth daily.   aspirin EC 81 MG tablet Take 1 tablet (81 mg total) by mouth daily with breakfast.   carvedilol (COREG) 12.5 MG tablet TAKE 1 TABLET (12.5 MG TOTAL) BY MOUTH 2 (TWO) TIMES DAILY WITH A MEAL.   cephALEXin (KEFLEX) 250 MG capsule Take 1 capsule (250 mg total) by mouth daily.   cholecalciferol (VITAMIN D) 1000 units tablet Take 1,000 Units by mouth daily with supper.    clopidogrel (PLAVIX) 75 MG tablet TAKE 1 TABLET BY MOUTH DAILY WITH BREAKFAST.   ferrous sulfate 325 (65 FE) MG tablet Take 1 tablet (325 mg total) by mouth 2 (two) times daily with a meal. Reported on 08/21/2015   FLUoxetine (PROZAC) 20 MG capsule TAKE 1 CAPSULE BY MOUTH  EVERY DAY   guaiFENesin-dextromethorphan (ROBITUSSIN DM) 100-10 MG/5ML syrup Take 10 mLs by mouth every 4 (four) hours as needed for cough.   Insulin Disposable Pump (OMNIPOD DASH 5 PACK PODS) MISC Inject into the skin See admin instructions. Use continuously with Novolin R - change every 72 hours.   insulin NPH Human (HUMULIN N,NOVOLIN N) 100 UNIT/ML injection Inject 15 Units into the skin See admin instructions. Inject 15 units subcutaneously in the morning if CBG >300;   insulin regular (NOVOLIN R) 100 units/mL injection Inject into the skin See admin instructions. Manually add bolus to continuous dose via OmniPod 3 times daily per sliding scale (CBG 80-150 7 units, 151-200 9 units, 201-250 12 units, 251-300 14 units, 301- 400 17 units)   levothyroxine (SYNTHROID,  LEVOTHROID) 50 MCG tablet Take 1 tablet (50 mcg total) by mouth daily before breakfast.   magnesium oxide (MAG-OX) 400 MG tablet TAKE 1 TABLET BY MOUTH EVERY DAY   Multiple Vitamins-Minerals (AIRBORNE GUMMIES PO) Take 2 tablets by mouth every evening.   Multiple Vitamins-Minerals (CENTRUM SILVER 50+WOMEN) TABS Take 1 tablet by mouth every evening.   Multiple Vitamins-Minerals (ZINC PO) Take 1 tablet by mouth every evening.   nitroGLYCERIN (NITROSTAT) 0.4 MG SL tablet Place 1 tablet (0.4 mg total) under the tongue every 5 (five) minutes as needed for chest [name].   ONETOUCH VERIO test strip 3 (three) times daily.   polyethylene glycol (MIRALAX / GLYCOLAX) 17 g packet Take 17 g by mouth daily.   pregabalin (LYRICA) 150 MG capsule Take 1 capsule (150 mg total) by mouth 2 (two) times daily.   torsemide (DEMADEX) 20 MG tablet Take by mouth. Patient takes 4 tablets in the morning and 4 tablet in the evening.   traZODone (DESYREL) 100 MG tablet Take 100 mg by mouth as needed for sleep. Patient takes 2 tablets by mouth as needed.   ursodiol (ACTIGALL) 500 MG tablet Take 500 mg by mouth 3 (three) times daily.   Allergies  Allergen Reactions    Drug Ingredient [Cephalexin] Diarrhea    Pt reports heavy diarrhea with use of keflex   Codeine Nausea And Vomiting   Past Medical History:  Diagnosis Date   Acute MI ([place]) 1999; 2007   Anemia    hx   Anginal [name] (HCC)    Anxiety    ARF (acute renal failure) ([place]) 06/2017   [facility]   Arthritis    "generalized" (03/15/2014)   CAD (coronary artery disease)    MI in 2000 - MI  2007 - treated bare metal stent (no nuclear since then as 9/11)   Carotid artery disease (HCC)    CHF (congestive heart failure) ([place])    Chronic diastolic heart failure ([place])    a) ECHO (08/2013) EF 55-60% and RV function nl b) RHC (08/2013) RA 4, RV 30/5/7, PA 25/10 (16), PCWP 7, Fick CO/CI 6.3/2.7, PVR 1.5 WU, PA 61 and 66%   Daily headache    "~ every other day; since I fell in June" (03/15/2014)   Depression    Dyslipidemia    Dyspnea    Exertional shortness of breath    HTN (hypertension)    Hypothyroidism    Neuropathy    Obesity    Osteoarthritis    Peripheral neuropathy    PONV (postoperative nausea and vomiting)    RBBB (right bundle branch block)    Old   Stroke ([place])    mini strokes   Syncope    likely due to low blood sugar   Tachycardia    Sinus tachycardia   Type II diabetes mellitus (HCC)    Type II   Urinary incontinence    Venous insufficiency    Family History  Problem Relation Age of Onset   Heart attack Mother 83   Past Surgical History:  Procedure Laterality Date   ABDOMINAL HYSTERECTOMY  1980's   AMPUTATION Right 02/24/2018   Procedure: RIGHT FOOT GREAT TOE AND 2ND TOE AMPUTATION;  Surgeon: Newt Minion, MD;  Location: [place];  Service: Orthopedics;  Laterality: Right;   AMPUTATION Right 04/30/2018   Procedure: RIGHT TRANSMETATARSAL AMPUTATION;  Surgeon: Newt Minion, MD;  Location: [place];  Service: Orthopedics;  Laterality: Right;   BIOPSY  05/27/2020  Procedure: BIOPSY;  Surgeon: [name], DO;  [facility];  Service: Endoscopy;;    CATARACT EXTRACTION, BILATERAL Bilateral ?2013   COLONOSCOPY W/ POLYPECTOMY     COLONOSCOPY WITH PROPOFOL N/A 03/13/2019   Procedure: COLONOSCOPY WITH PROPOFOL;  Surgeon: Jerene Bears, MD;  Location: [place];  Service: Gastroenterology;  Laterality: N/A;   Fellows; 2007   "1 + 1"   ESOPHAGOGASTRODUODENOSCOPY (EGD) WITH PROPOFOL N/A 03/13/2019   Procedure: ESOPHAGOGASTRODUODENOSCOPY (EGD) WITH PROPOFOL;  Surgeon: Jerene Bears, MD;  Location: [place];  Service: Gastroenterology;  Laterality: N/A;   ESOPHAGOGASTRODUODENOSCOPY (EGD) WITH PROPOFOL N/A 05/27/2020   Procedure: ESOPHAGOGASTRODUODENOSCOPY (EGD) WITH PROPOFOL;  Surgeon: [name], DO;  [facility];  Service: Endoscopy;  Laterality: N/A;   EYE SURGERY Bilateral    lazer   HEMOSTASIS CLIP PLACEMENT  03/13/2019   Procedure: HEMOSTASIS CLIP PLACEMENT;  Surgeon: Jerene Bears, MD;  Location: [facility] ENDOSCOPY;  Service: Gastroenterology;;   KNEE ARTHROSCOPY Left 10/25/2006   POLYPECTOMY  03/13/2019   Procedure: POLYPECTOMY;  Surgeon: Jerene Bears, MD;  Location: [place];  Service: Gastroenterology;;   RIGHT HEART CATH N/A 07/24/2017   Procedure: RIGHT HEART CATH;  Surgeon: Jolaine Artist, MD;  Location: [place] CV LAB;  Service: Cardiovascular;  Laterality: N/A;   RIGHT HEART CATHETERIZATION N/A 09/22/2013   Procedure: RIGHT HEART CATH;  Surgeon: Jolaine Artist, MD;  Location: [facility] CATH LAB;  Service: Cardiovascular;  Laterality: N/A;   SHOULDER ARTHROSCOPY WITH OPEN ROTATOR CUFF REPAIR Right 03/14/2014   Procedure: RIGHT SHOULDER ARTHROSCOPY WITH BICEPS RELEASE, OPEN SUBSCAPULA REPAIR, OPEN SUPRASPINATUS REPAIR.;  Surgeon: Meredith Pel, MD;  Location: [place];  Service: Orthopedics;  Laterality: Right;   TOE AMPUTATION Right 02/24/2018   GREAT TOE AND 2ND TOE AMPUTATION   TUBAL LIGATION  1970's   Social History   Socioeconomic History   Marital status:  Married    Spouse name: Not on file   Number of children: 3   Years of education: 12th   Highest education level: Not on file  Occupational History    Employer: UNEMPLOYED  Tobacco Use   Smoking status: Former    Packs/day: 3.00    Years: 32.00    Pack years: 96.00    Types: Cigarettes    Quit date: 10/24/1997    Years since quitting: 23.2   Smokeless tobacco: Never  Vaping Use   Vaping Use: Never used  Substance and Sexual Activity   Alcohol use: Not Currently    Comment: "might have 2-3 daiquiris in the summer"   Drug use: No   Sexual activity: Not Currently  Other Topics Concern   Not on file  Social History Narrative   Pt lives at home with her spouse.   Caffeine Use- 3 sodas daily   Social Determinants of Health   Financial Resource Strain: Low Risk    Difficulty of Paying Living Expenses: Not very hard  Food Insecurity: No Food Insecurity   Worried About Charity fundraiser in the Last Year: Never true   Ran Out of Food in the Last Year: Never true  Transportation Needs: No Transportation Needs   Lack of Transportation (Medical): No   Lack of Transportation (Non-Medical): No  Physical Activity: Not on file  Stress: Not on file  Social Connections: Not on file  Intimate Partner Violence: Not on file   Lipid Panel  Component Value Date/Time   CHOL 242 (H) 12/21/2019 1550   TRIG 125 03/07/2020 1808   HDL 52 12/21/2019 1550   CHOLHDL 4.7 12/21/2019 1550   VLDL 60 (H) 12/21/2019 1550   LDLCALC 130 (H) 12/21/2019 1550   LDLCALC 65 01/17/2019 1608   Current Outpatient Medications on File Prior to Encounter  Medication Sig Dispense Refill   acetaminophen (TYLENOL) 500 MG tablet Take 1,000 mg by mouth every 6 (six) hours as needed for moderate [name] or headache.     albuterol (VENTOLIN HFA) 108 (90 Base) MCG/ACT inhaler TAKE 2 PUFFS BY MOUTH EVERY 6 HOURS AS NEEDED FOR WHEEZE OR SHORTNESS OF BREATH 8.5 g 1   allopurinol (ZYLOPRIM) 100 MG tablet TAKE 1 TABLET BY  MOUTH TWICE A DAY 180 tablet 4   Ascorbic Acid (VITAMIN C) 1000 MG tablet Take 1,000 mg by mouth daily.     aspirin EC 81 MG tablet Take 1 tablet (81 mg total) by mouth daily with breakfast. 30 tablet 11   carvedilol (COREG) 12.5 MG tablet TAKE 1 TABLET (12.5 MG TOTAL) BY MOUTH 2 (TWO) TIMES DAILY WITH A MEAL. 180 tablet 1   cephALEXin (KEFLEX) 250 MG capsule Take 1 capsule (250 mg total) by mouth daily. 30 capsule 2   cholecalciferol (VITAMIN D) 1000 units tablet Take 1,000 Units by mouth daily with supper.      clopidogrel (PLAVIX) 75 MG tablet TAKE 1 TABLET BY MOUTH DAILY WITH BREAKFAST. 90 tablet 3   ferrous sulfate 325 (65 FE) MG tablet Take 1 tablet (325 mg total) by mouth 2 (two) times daily with a meal. Reported on 08/21/2015 60 tablet 2   FLUoxetine (PROZAC) 20 MG capsule TAKE 1 CAPSULE BY MOUTH EVERY DAY 90 capsule 2   guaiFENesin-dextromethorphan (ROBITUSSIN DM) 100-10 MG/5ML syrup Take 10 mLs by mouth every 4 (four) hours as needed for cough. 118 mL 0   Insulin Disposable Pump (OMNIPOD DASH 5 PACK PODS) MISC Inject into the skin See admin instructions. Use continuously with Novolin R - change every 72 hours.     insulin NPH Human (HUMULIN N,NOVOLIN N) 100 UNIT/ML injection Inject 15 Units into the skin See admin instructions. Inject 15 units subcutaneously in the morning if CBG >300;     insulin regular (NOVOLIN R) 100 units/mL injection Inject into the skin See admin instructions. Manually add bolus to continuous dose via OmniPod 3 times daily per sliding scale (CBG 80-150 7 units, 151-200 9 units, 201-250 12 units, 251-300 14 units, 301- 400 17 units)     levothyroxine (SYNTHROID, LEVOTHROID) 50 MCG tablet Take 1 tablet (50 mcg total) by mouth daily before breakfast. 90 tablet 1   magnesium oxide (MAG-OX) 400 MG tablet TAKE 1 TABLET BY MOUTH EVERY DAY 120 tablet 2   Multiple Vitamins-Minerals (AIRBORNE GUMMIES PO) Take 2 tablets by mouth every evening.     Multiple Vitamins-Minerals  (CENTRUM SILVER 50+WOMEN) TABS Take 1 tablet by mouth every evening.     Multiple Vitamins-Minerals (ZINC PO) Take 1 tablet by mouth every evening.     nitroGLYCERIN (NITROSTAT) 0.4 MG SL tablet Place 1 tablet (0.4 mg total) under the tongue every 5 (five) minutes as needed for chest [name]. 25 tablet 1   ONETOUCH VERIO test strip 3 (three) times daily.     polyethylene glycol (MIRALAX / GLYCOLAX) 17 g packet Take 17 g by mouth daily. 14 each 0   pregabalin (LYRICA) 150 MG capsule Take 1 capsule (150 mg total) by  mouth 2 (two) times daily. 60 capsule 2   torsemide (DEMADEX) 20 MG tablet Take by mouth. Patient takes 4 tablets in the morning and 4 tablet in the evening.     traZODone (DESYREL) 100 MG tablet Take 100 mg by mouth as needed for sleep. Patient takes 2 tablets by mouth as needed.     ursodiol (ACTIGALL) 500 MG tablet Take 500 mg by mouth 3 (three) times daily.     No current facility-administered medications on file prior to encounter.   Wt Readings from Last 3 Encounters:  02/01/21 123.6 kg (272 lb 6.4 oz)  01/07/21 122.9 kg (271 lb)  12/17/20 120.7 kg (266 lb)   BP (!) 158/70   Pulse 98   Wt 123.6 kg (272 lb 6.4 oz)   SpO2 92%   BMI 45.33 kg/m   PHYSICAL EXAM: General:  NAD. No resp difficulty, walked into clinic with cane HEENT: Normal Neck: Supple. No JVD. Carotids 2+ bilat; no bruits. No lymphadenopathy or thryomegaly appreciated. Cor: PMI nondisplaced. Regular rate & rhythm. No rubs, gallops or murmurs. Lungs: Clear Abdomen: Obese, nontender, nondistended. No hepatosplenomegaly. No bruits or masses. Good bowel sounds. Extremities: No cyanosis, clubbing, rash, 1-2+ LE edema, compression hose one. Neuro: Alert & oriented x 3, cranial nerves grossly intact. Moves all 4 extremities w/o difficulty. Affect pleasant.  ReDs: 38%  ASSESSMENT AND PLAN:   1. Chronic Diastolic Heart Failure:  - NICM, Echo 06/2017: EF 55-60%, grade 1 DD - Echo 03/19/18: EF 55-60%, grade 1  DD - Echo 01/2020 EF 55-60%. RV normal.  - NYHA III, chronic confounded by obesity/ deconditioning.  Volume status mildly elevated 2/2 fluid indiscretion. ReDs 38%. - Continue compression hose. - Reduce po fluid intake. - With renal function, I will not escalate diuretics today. Continue torsemide 100 mg bid.  - Continue carvedilol 12.5 mg bid. - Needs to do better w/ sodium reduction and fluid restriction  - BMET today.   2.  Uncontrolled DM:  - Follows closely with Dr. Bubba Camp.  - Hgb a1c 10.3 (4/22) - Has an insulin pump.  3. CAD:  - History of CAD with BMS in 2007.  Low risk nuc study in 2018 - Stable w/o CP.  - Continue DAPT w/ ASA + Plavix.  - Continue ? blocker therapy. - Off statin due to previously elevated LFTs.   4. Morbid Obesity: - Body mass index is 45.33 kg/m.   5. HTN:  - Elevated today. - Check BP at home, if SBP>150, notify clinic. Consider increasing carvedilol. - Aim to keep SBP 120-130 to help w/ renal perfusion.  6. CKD Stage IV - Followed by Dr. Hollie Salk.  - New baseline now ~3.5. - BMET today.   7. Daytime fatigue - Completed sleep study. - No evidence of OSA.   8. S/p right transmetatarsal amputation - Followed by Dr Sharol Given.    9. Recurrent UTIs - UTI 3/22 + 4/22 treated w/ ceftriaxone. - UTI 5/22 UC + for Enterobacter aerogenes.  - Now on chronic abx for suppressive therapy.   She has graduated from [employer] as of 7/22.  F/u with [facility] in 6 weeks to reassess BP and volume and in 3-4  months with Dr. Haroldine Laws.  Bell FNP 02/01/2021 3:10 PM

## 2021-02-01 ENCOUNTER — Ambulatory Visit (HOSPITAL_COMMUNITY)
Admission: RE | Admit: 2021-02-01 | Discharge: 2021-02-01 | Disposition: A | Discharge status: Home / Self Care | Payer: HMO | Source: Ambulatory Visit | Attending: Family Medicine | Admitting: Family Medicine

## 2021-02-01 ENCOUNTER — Other Ambulatory Visit: Payer: Self-pay

## 2021-02-01 ENCOUNTER — Encounter (HOSPITAL_COMMUNITY): Payer: Self-pay

## 2021-02-01 ENCOUNTER — Telehealth: Payer: Self-pay | Admitting: Internal Medicine

## 2021-02-01 VITALS — BP 158/70 | HR 98 | Wt 272.4 lb

## 2021-02-01 DIAGNOSIS — I2510 Atherosclerotic heart disease of native coronary artery without angina pectoris: Secondary | ICD-10-CM

## 2021-02-01 DIAGNOSIS — Z7902 Long term (current) use of antithrombotics/antiplatelets: Secondary | ICD-10-CM | POA: Diagnosis not present

## 2021-02-01 DIAGNOSIS — Z87440 Personal history of urinary (tract) infections: Secondary | ICD-10-CM | POA: Diagnosis not present

## 2021-02-01 DIAGNOSIS — R5383 Other fatigue: Secondary | ICD-10-CM | POA: Diagnosis not present

## 2021-02-01 DIAGNOSIS — N390 Urinary tract infection, site not specified: Secondary | ICD-10-CM

## 2021-02-01 DIAGNOSIS — R059 Cough, unspecified: Secondary | ICD-10-CM | POA: Diagnosis not present

## 2021-02-01 DIAGNOSIS — Z8673 Personal history of transient ischemic attack (TIA), and cerebral infarction without residual deficits: Secondary | ICD-10-CM | POA: Diagnosis not present

## 2021-02-01 DIAGNOSIS — Z6841 Body Mass Index (BMI) 40.0 and over, adult: Secondary | ICD-10-CM | POA: Diagnosis not present

## 2021-02-01 DIAGNOSIS — I10 Essential (primary) hypertension: Secondary | ICD-10-CM

## 2021-02-01 DIAGNOSIS — Z89421 Acquired absence of other right toe(s): Secondary | ICD-10-CM | POA: Diagnosis not present

## 2021-02-01 DIAGNOSIS — Z7982 Long term (current) use of aspirin: Secondary | ICD-10-CM | POA: Insufficient documentation

## 2021-02-01 DIAGNOSIS — E1165 Type 2 diabetes mellitus with hyperglycemia: Secondary | ICD-10-CM

## 2021-02-01 DIAGNOSIS — I130 Hypertensive heart and chronic kidney disease with heart failure and stage 1 through stage 4 chronic kidney disease, or unspecified chronic kidney disease: Secondary | ICD-10-CM | POA: Insufficient documentation

## 2021-02-01 DIAGNOSIS — Z899 Acquired absence of limb, unspecified: Secondary | ICD-10-CM | POA: Diagnosis not present

## 2021-02-01 DIAGNOSIS — E039 Hypothyroidism, unspecified: Secondary | ICD-10-CM | POA: Diagnosis not present

## 2021-02-01 DIAGNOSIS — R400 Somnolence: Secondary | ICD-10-CM | POA: Insufficient documentation

## 2021-02-01 DIAGNOSIS — Z79890 Hormone replacement therapy (postmenopausal): Secondary | ICD-10-CM | POA: Diagnosis not present

## 2021-02-01 DIAGNOSIS — I252 Old myocardial infarction: Secondary | ICD-10-CM | POA: Diagnosis not present

## 2021-02-01 DIAGNOSIS — I272 Pulmonary hypertension, unspecified: Secondary | ICD-10-CM | POA: Insufficient documentation

## 2021-02-01 DIAGNOSIS — E1122 Type 2 diabetes mellitus with diabetic chronic kidney disease: Secondary | ICD-10-CM

## 2021-02-01 DIAGNOSIS — Z955 Presence of coronary angioplasty implant and graft: Secondary | ICD-10-CM | POA: Insufficient documentation

## 2021-02-01 DIAGNOSIS — Z885 Allergy status to narcotic agent status: Secondary | ICD-10-CM | POA: Diagnosis not present

## 2021-02-01 DIAGNOSIS — Z881 Allergy status to other antibiotic agents status: Secondary | ICD-10-CM | POA: Diagnosis not present

## 2021-02-01 DIAGNOSIS — Z89431 Acquired absence of right foot: Secondary | ICD-10-CM

## 2021-02-01 DIAGNOSIS — N184 Chronic kidney disease, stage 4 (severe): Secondary | ICD-10-CM | POA: Insufficient documentation

## 2021-02-01 DIAGNOSIS — Z794 Long term (current) use of insulin: Secondary | ICD-10-CM | POA: Insufficient documentation

## 2021-02-01 DIAGNOSIS — Z8249 Family history of ischemic heart disease and other diseases of the circulatory system: Secondary | ICD-10-CM | POA: Insufficient documentation

## 2021-02-01 DIAGNOSIS — Z9641 Presence of insulin pump (external) (internal): Secondary | ICD-10-CM | POA: Insufficient documentation

## 2021-02-01 DIAGNOSIS — Z79899 Other long term (current) drug therapy: Secondary | ICD-10-CM | POA: Insufficient documentation

## 2021-02-01 DIAGNOSIS — I5032 Chronic diastolic (congestive) heart failure: Secondary | ICD-10-CM

## 2021-02-01 LAB — BASIC METABOLIC PANEL
Anion gap: 16 — ABNORMAL HIGH (ref 5–15)
BUN: 93 mg/dL — ABNORMAL HIGH (ref 8–23)
CO2: 21 mmol/L — ABNORMAL LOW (ref 22–32)
Calcium: 8.5 mg/dL — ABNORMAL LOW (ref 8.9–10.3)
Chloride: 90 mmol/L — ABNORMAL LOW (ref 98–111)
Creatinine, Ser: 3.54 mg/dL — ABNORMAL HIGH (ref 0.44–1.00)
GFR, Estimated: 13 mL/min — ABNORMAL LOW (ref >60)
Glucose, Bld: 922 mg/dL (ref 70–99)
Potassium: 4.2 mmol/L (ref 3.5–5.1)
Sodium: 127 mmol/L — ABNORMAL LOW (ref 135–145)

## 2021-02-01 NOTE — Patient Instructions (Addendum)
It was great to see you today! No medication changes are needed at this time.  Labs today We will only contact you if something comes back abnormal or we need to make some changes. Otherwise no news is good news!  Be sure to limit fluids to LESS THAN 2 Liters daily ( 68 ounces)  Your physician has requested that you regularly monitor and record your blood pressure readings at home. Please use the same machine at the same time of day to check your readings and record them to bring to your follow-up visit. -if your systolic blood pressure (top number) is consistently above 150, please be sure to give our office a call.  Your physician recommends that you schedule a follow-up appointment in: 3 months with Dr Haroldine Laws   Do the following things EVERYDAY: Weigh yourself in the morning before breakfast. Write it down and keep it in a log. Take your medicines as prescribed Eat low salt foods--Limit salt (sodium) to 2000 mg per day.  Stay as active as you can everyday Limit all fluids for the day to less than 2 liters  At the [facility], you and your health needs are our priority. As part of our continuing mission to provide you with exceptional heart care, we have created designated Provider Care Teams. These Care Teams include your primary Cardiologist (physician) and Advanced Practice Providers (APPs- Physician Assistants and Nurse Practitioners) who all work together to provide you with the care you need, when you need it.   You may see any of the following providers on your designated Care Team at your next follow up: Dr Glori Bickers Dr Loralie Champagne Dr Patrice Paradise, NP Lyda Jester, [place] Ginnie Smart Audry Riles, PharmD   Please be sure to bring in all your medications bottles to every appointment.   If you have any questions or concerns before your next appointment please send [name] a message through [name] or call our office at  [phone].    TO LEAVE A MESSAGE FOR THE NURSE SELECT OPTION 2, PLEASE LEAVE A MESSAGE INCLUDING: YOUR NAME DATE OF BIRTH CALL BACK NUMBER REASON FOR CALL**this is important as we prioritize the call backs  YOU WILL RECEIVE A CALL BACK THE SAME DAY AS LONG AS YOU CALL BEFORE 4:00 PM

## 2021-02-01 NOTE — Progress Notes (Signed)
ReDS Vest / Clip - 02/01/21 1500       ReDS Vest / Clip   Station Marker B    Ruler Value 34.5    ReDS Value Range Moderate volume overload    ReDS Actual Value 38

## 2021-02-04 ENCOUNTER — Ambulatory Visit (INDEPENDENT_AMBULATORY_CARE_PROVIDER_SITE_OTHER): Payer: HMO | Admitting: *Deleted

## 2021-02-04 DIAGNOSIS — I5032 Chronic diastolic (congestive) heart failure: Secondary | ICD-10-CM

## 2021-02-04 DIAGNOSIS — E1142 Type 2 diabetes mellitus with diabetic polyneuropathy: Secondary | ICD-10-CM

## 2021-02-04 DIAGNOSIS — Z794 Long term (current) use of insulin: Secondary | ICD-10-CM

## 2021-02-04 NOTE — Patient Instructions (Signed)
Visit Information   PATIENT GOALS:   Goals Addressed             This Visit's Progress    Monitor and Manage My Blood Sugar-Diabetes Type 2       Timeframe:  Long-Range Goal Priority:  High Start Date:            02/04/2021                 Expected End Date:    08/04/2021                   Follow Up Date 03/04/2021   - check blood sugar at prescribed times - check blood sugar if I feel it is too high or too low - enter blood sugar readings and medication or insulin into daily log - take the blood sugar log to all doctor visits - take the blood sugar meter to all doctor visits  - look over information mailed to you - hypoglycemia - please practice portion control and be mindful of carbohydrate intake - please avoid sugary drinks and concentrated sweets   Why is this important?   Checking your blood sugar at home helps to keep it from getting very high or very low.  Writing the results in a diary or log helps the doctor know how to care for you.  Your blood sugar log should have the time, date and the results.  Also, write down the amount of insulin or other medicine that you take.  Other information, like what you ate, exercise done and how you were feeling, will also be helpful.     Notes:      Track and Manage Symptoms-Heart Failure       Timeframe:  Long-Range Goal Priority:  High Start Date:             02/04/2021                Expected End Date:     08/04/2021                  Follow Up Date 03/04/2021   - develop a rescue plan - eat more whole grains, fruits and vegetables, lean meats and healthy fats - follow rescue plan if symptoms flare-up - know when to call the doctor  - follow low sodium, heart healthy - look over education mailed- low sodium diet - take all medications as prescribed - expect a call from pharmacist about ursodiol- assistance with cost - continue to weight daily   Why is this important?   You will be able to handle your symptoms better if  you keep track of them.  Making some simple changes to your lifestyle will help.  Eating healthy is one thing you can do to take good care of yourself.    Notes:         Consent to CCM Services: Ms. Bencosme was given information about Chronic Care Management services including:  CCM service includes personalized support from designated clinical staff supervised by her physician, including individualized plan of care and coordination with other care providers 24/7 contact phone numbers for assistance for urgent and routine care needs. Service will only be billed when office clinical staff spend 20 minutes or more in a month to coordinate care. Only one practitioner may furnish and bill the service in a calendar month. The patient may stop CCM services at any time (effective at the end of the month)  by phone call to the office staff. The patient will be responsible for cost sharing (co-pay) of up to 20% of the service fee (after annual deductible is met).  Patient agreed to services and verbal consent obtained.   The patient verbalized understanding of instructions, educational materials, and care plan provided today and agreed to receive a mailed copy of patient instructions, educational materials, and care plan. Hypoglycemia Hypoglycemia is when the sugar (glucose) level in your blood is too low. Low blood sugar can happen to people who have diabetes and people who do not have diabetes. Low blood sugar can happen quickly, and it can be an emergency. What are the causes? This condition happens most often in people who have diabetes. It may be caused by: Diabetes medicine. Not eating enough, or not eating often enough. Doing more physical activity. Drinking alcohol on an empty stomach. If you do not have diabetes, this condition may be caused by: A tumor in the pancreas. Not eating enough, or not eating for long periods at a time (fasting). A very bad infection or illness. Problems after  having weight loss (bariatric) surgery. Kidney failure or liver failure. Certain medicines. What increases the risk? This condition is more likely to develop in people who: Have diabetes and take medicines to lower their blood sugar. Abuse alcohol. Have a very bad illness. What are the signs or symptoms? Mild Hunger. Sweating and feeling clammy. Feeling dizzy or light-headed. Being sleepy or having trouble sleeping. Feeling like you may vomit (nauseous). A fast heartbeat. A headache. Blurry vision. Mood changes, such as: Being grouchy. Feeling worried or nervous (anxious). Tingling or loss of feeling (numbness) around your mouth, lips, or tongue. Moderate Confusion and poor judgment. Behavior changes. Weakness. Uneven heartbeat. Trouble with moving (coordination). Very low Very low blood sugar (severe hypoglycemia) is a medical emergency. It can cause: Fainting. Seizures. Loss of consciousness (coma). Death. How is this treated? Treating low blood sugar Low blood sugar is often treated by eating or drinking something that has sugar in it right away. The food or drink should contain 15 grams of a fast-acting carb (carbohydrate). Options include: 4 oz (120 mL) of fruit juice. 4 oz (120 mL) of regular soda (not diet soda). A few pieces of hard candy. Check food labels to see how many pieces to eat for 15 grams. 1 Tbsp (15 mL) of sugar or honey. 4 glucose tablets. 1 tube of glucose gel. Treating low blood sugar if you have diabetes If you can think clearly and swallow safely, follow the 15:15 rule: Take 15 grams of a fast-acting carb. Talk with your doctor about how much you should take. Always keep a source of fast-acting carb with you, such as: Glucose tablets (take 4 tablets). A few pieces of hard candy. Check food labels to see how many pieces to eat for 15 grams. 4 oz (120 mL) of fruit juice. 4 oz (120 mL) of regular soda (not diet soda). 1 Tbsp (15 mL) of honey or  sugar. 1 tube of glucose gel. Check your blood sugar 15 minutes after you take the carb. If your blood sugar is still at or below 70 mg/dL (3.9 mmol/L), take 15 grams of a carb again. If your blood sugar does not go above 70 mg/dL (3.9 mmol/L) after 3 tries, get help right away. After your blood sugar goes back to normal, eat a meal or a snack within 1 hour.  Treating very low blood sugar If your blood sugar is  below 54 mg/dL (3 mmol/L), you have very low blood sugar, or severe hypoglycemia. This is an emergency. Get medical help right away. If you have very low blood sugar and you cannot eat or drink, you will need to be given a hormone called glucagon. A family member or friend should learn how to check your blood sugar and how to give you glucagon. Ask your doctor if you need to have an emergency glucagon kit at home. Very low blood sugar may also need to be treated in a hospital. Follow these instructions at home: General instructions Take over-the-counter and prescription medicines only as told by your doctor. Stay aware of your blood sugar as told by your doctor. If you drink alcohol: Limit how much you have to: 0-1 drink a day for women who are not pregnant. 0-2 drinks a day for men. Know how much alcohol is in your drink. In the [place] one drink equals one 12 oz bottle of beer (355 mL), one 5 oz glass of wine (148 mL), or one 1 oz glass of hard liquor (44 mL). Be sure to eat food when you drink alcohol. Know that your body absorbs alcohol quickly. This may lead to low blood sugar later. Be sure to keep checking your blood sugar. Keep all follow-up visits. If you have diabetes:  Always have a fast-acting carb (15 grams) with you to treat low blood sugar. Follow your diabetes care plan as told by your doctor. Make sure you: Know the symptoms of low blood sugar. Check your blood sugar as often as told. Always check it before and after exercise. Always check your blood sugar before  you drive. Take your medicines as told. Follow your meal plan. Eat on time. Do not skip meals. Share your diabetes care plan with: Your work or school. People you live with. Carry a card or wear jewelry that says you have diabetes. Where to find more information American Diabetes Association: www.diabetes.org Contact a doctor if: You have trouble keeping your blood sugar in your target range. You have low blood sugar often. Get help right away if: You still have symptoms after you eat or drink something that contains 15 grams of fast-acting carb, and you cannot get your blood sugar above 70 mg/dL by following the 15:15 rule. Your blood sugar is below 54 mg/dL (3 mmol/L). You have a seizure. You faint. These symptoms may be an emergency. Get help right away. Call your local emergency services (911 in the [place] Do not wait to see if the symptoms will go away. Do not drive yourself to the hospital. Summary Hypoglycemia happens when the level of sugar (glucose) in your blood is too low. Low blood sugar can happen to people who have diabetes and people who do not have diabetes. Low blood sugar can happen quickly, and it can be an emergency. Make sure you know the symptoms of low blood sugar and know how to treat it. Always keep a source of sugar (fast-acting carb) with you to treat low blood sugar. This information is not intended to replace advice given to you by your health care provider. Make sure you discuss any questions you have with your health care provider. Document Revised: 04/12/2020 Document Reviewed: 04/12/2020 Elsevier Patient Education  2022 [place]. Low-Sodium Eating Plan Sodium, which is an element that makes up salt, helps you maintain a healthy balance of fluids in your body. Too much sodium can increase your blood pressure and cause fluid and waste to  be held in your body. Your health care provider or dietitian may recommend following this plan if you have high  blood pressure (hypertension), kidney disease, liver disease, or heart failure. Eating less sodium can help lower your blood pressure, reduce swelling, and protect your heart, liver, and kidneys. What are tips for following this plan? Reading food labels The Nutrition Facts label lists the amount of sodium in one serving of the food. If you eat more than one serving, you must multiply the listed amount of sodium by the number of servings. Choose foods with less than 140 mg of sodium per serving. Avoid foods with 300 mg of sodium or more per serving. Shopping  Look for lower-sodium products, often labeled as "low-sodium" or "no salt added." Always check the sodium content, even if foods are labeled as "unsalted" or "no salt added." Buy fresh foods. Avoid canned foods and pre-made or frozen meals. Avoid canned, cured, or processed meats. Buy breads that have less than 80 mg of sodium per slice. Cooking  Eat more home-cooked food and less restaurant, buffet, and fast food. Avoid adding salt when cooking. Use salt-free seasonings or herbs instead of table salt or sea salt. Check with your health care provider or pharmacist before using salt substitutes. Cook with plant-based oils, such as canola, sunflower, or olive oil. Meal planning When eating at a restaurant, ask that your food be prepared with less salt or no salt, if possible. Avoid dishes labeled as brined, pickled, cured, smoked, or made with soy sauce, miso, or teriyaki sauce. Avoid foods that contain MSG (monosodium glutamate). MSG is sometimes added to [place] food, bouillon, and some canned foods. Make meals that can be grilled, baked, poached, roasted, or steamed. These are generally made with less sodium. General information Most people on this plan should limit their sodium intake to 1,500-2,000 mg (milligrams) of sodium each day. What foods should I eat? Fruits Fresh, frozen, or canned fruit. Fruit juice. Vegetables Fresh or  frozen vegetables. "No salt added" canned vegetables. "No salt added" tomato sauce and paste. Low-sodium or reduced-sodium tomato and vegetable juice. Grains Low-sodium cereals, including oats, puffed wheat and rice, and shredded wheat. Low-sodium crackers. Unsalted rice. Unsalted pasta. Low-sodium bread. Whole-grain breads and whole-grain pasta. Meats and other proteins Fresh or frozen (no salt added) meat, poultry, seafood, and fish. Low-sodium canned tuna and salmon. Unsalted nuts. Dried peas, beans, and lentils without added salt. Unsalted canned beans. Eggs. Unsalted nut butters. Dairy Milk. Soy milk. Cheese that is naturally low in sodium, such as ricotta cheese, fresh mozzarella, or Swiss cheese. Low-sodium or reduced-sodium cheese. Cream cheese. Yogurt. Seasonings and condiments Fresh and dried herbs and spices. Salt-free seasonings. Low-sodium mustard and ketchup. Sodium-free salad dressing. Sodium-free light mayonnaise. Fresh or refrigerated horseradish. Lemon juice. Vinegar. Other foods Homemade, reduced-sodium, or low-sodium soups. Unsalted popcorn and pretzels. Low-salt or salt-free chips. The items listed above may not be a complete list of foods and beverages you can eat. Contact a dietitian for more information. What foods should I avoid? Vegetables Sauerkraut, pickled vegetables, and relishes. Olives. [place] fries. Onion rings. Regular canned vegetables (not low-sodium or reduced-sodium). Regular canned tomato sauce and paste (not low-sodium or reduced-sodium). Regular tomato and vegetable juice (not low-sodium or reduced-sodium). Frozen vegetables in sauces. Grains Instant hot cereals. Bread stuffing, pancake, and biscuit mixes. Croutons. Seasoned rice or pasta mixes. Noodle soup cups. Boxed or frozen macaroni and cheese. Regular salted crackers. Self-rising flour. Meats and other proteins Meat or fish that  is salted, canned, smoked, spiced, or pickled. Precooked or cured meat,  such as sausages or meat loaves. [place] Salines. Ham. Pepperoni. Hot dogs. Corned beef. Chipped beef. Salt pork. Jerky. Pickled herring. Anchovies and sardines. Regular canned tuna. Salted nuts. Dairy Processed cheese and cheese spreads. Hard cheeses. Cheese curds. Blue cheese. Feta cheese. String cheese. Regular cottage cheese. Buttermilk. Canned milk. Fats and oils Salted butter. Regular margarine. Ghee. Bacon fat. Seasonings and condiments Onion salt, garlic salt, seasoned salt, table salt, and sea salt. Canned and packaged gravies. Worcestershire sauce. Tartar sauce. Barbecue sauce. Teriyaki sauce. Soy sauce, including reduced-sodium. Steak sauce. Fish sauce. Oyster sauce. Cocktail sauce. Horseradish that you find on the shelf. Regular ketchup and mustard. Meat flavorings and tenderizers. Bouillon cubes. Hot sauce. Pre-made or packaged marinades. Pre-made or packaged taco seasonings. Relishes. Regular salad dressings. Salsa. Other foods Salted popcorn and pretzels. Corn chips and puffs. Potato and tortilla chips. Canned or dried soups. Pizza. Frozen entrees and pot pies. The items listed above may not be a complete list of foods and beverages you should avoid. Contact a dietitian for more information. Summary Eating less sodium can help lower your blood pressure, reduce swelling, and protect your heart, liver, and kidneys. Most people on this plan should limit their sodium intake to 1,500-2,000 mg (milligrams) of sodium each day. Canned, boxed, and frozen foods are high in sodium. Restaurant foods, fast foods, and pizza are also very high in sodium. You also get sodium by adding salt to food. Try to cook at home, eat more fresh fruits and vegetables, and eat less fast food and canned, processed, or prepared foods. This information is not intended to replace advice given to you by your health care provider. Make sure you discuss any questions you have with your health care provider. Document Revised:  06/17/2019 Document Reviewed: 04/13/2019 Elsevier Patient Education  2022 [place]. Heart Failure Action Plan A heart failure action plan helps you understand what to do when you have symptoms of heart failure. Your action plan is a color-coded plan that lists the symptoms to watch for and indicates what actions to take. If you have symptoms in the red zone, you need medical care right away. If you have symptoms in the yellow zone, you are having problems. If you have symptoms in the green zone, you are doing well. Follow the plan that was created by you and your health care provider. Review your plan each time you visit your health care provider. Red zone These signs and symptoms mean you should get medical help right away: You have trouble breathing when resting. You have a dry cough that is getting worse. You have swelling or pain in your legs or abdomen that is getting worse. You suddenly gain more than 2-3 lb (0.9-1.4 kg) in 24 hours, or more than 5 lb (2.3 kg) in a week. This amount may be more or less depending on your condition. You have trouble staying awake or you feel confused. You have chest pain. You do not have an appetite. You pass out. You have worsening sadness or depression. If you have any of these symptoms, call your local emergency services (911 in the [place] right away. Do not drive yourself to the hospital. Yellow zone These signs and symptoms mean your condition may be getting worse and you should make some changes: You have trouble breathing when you are active, or you need to sleep with your head raised on extra pillows to help you breathe. You have  swelling in your legs or abdomen. You gain 2-3 lb (0.9-1.4 kg) in 24 hours, or 5 lb (2.3 kg) in a week. This amount may be more or less depending on your condition. You get tired easily. You have trouble sleeping. You have a dry cough. If you have any of these symptoms: Contact your health care provider within  the next day. Your health care provider may adjust your medicines. Green zone These signs mean you are doing well and can continue what you are doing: You do not have shortness of breath. You have very little swelling or no new swelling. Your weight is stable (no gain or loss). You have a normal activity level. You do not have chest pain or any other new symptoms. Follow these instructions at home: Take over-the-counter and prescription medicines only as told by your health care provider. Weigh yourself daily. Your target weight is __________ lb (__________ kg). Call your health care provider if you gain more than __________ lb (__________ kg) in 24 hours, or more than __________ lb (__________ kg) in a week. Health care provider name: _____________________________________________________ Health care provider phone number: _____________________________________________________ Eat a heart-healthy diet. Work with a diet and nutrition specialist (dietitian) to create an eating plan that is best for you. Keep all follow-up visits. This is important. Where to find more information American Heart Association: www.heart.org Summary A heart failure action plan helps you understand what to do when you have symptoms of heart failure. Follow the action plan that was created by you and your health care provider. Get help right away if you have any symptoms in the red zone. This information is not intended to replace advice given to you by your health care provider. Make sure you discuss any questions you have with your health care provider. Document Revised: 12/26/2019 Document Reviewed: 12/26/2019 Elsevier Patient Education  2022 [place].   Telephone follow up appointment with care management team member scheduled for:  03/04/2021  Jacqlyn Larsen [facility], BSN RN Case Manager [facility] [phone]   CLINICAL CARE PLAN: Patient Care Plan: Heart Failure (Adult)      Problem Identified: Symptom Exacerbation (Heart Failure)   Priority: High     Long-Range Goal: Symptom Exacerbation Prevented or Minimized   Start Date: 02/04/2021  Expected End Date: 08/04/2021  This Visit's Progress: On track  Priority: High  Note:   Current Barriers:  Knowledge deficit related to basic heart failure pathophysiology and self care management- pt lives with spouse, pt is independent in most all aspects of her care, has walker and cane to use as needed, continues to drive. Reports does not adhere to a special diet, pt does weigh daily and record, checks BP per doctor instructions as has been elevated recently. Financial strain- cannot afford ursodiol (290$ per month), would like pharmacist to outreach Does not adhere to provider recommendations re: low sodium, heart healthy diet, does not exercise Case Manager Clinical Goal(s):  patient will weigh self daily and record patient will verbalize understanding of Heart Failure Action Plan and when to call doctor patient will take all Heart Failure mediations as prescribed patient will weigh daily and record (notifying MD of 3 lb weight gain over night or 5 lb in a week) Interventions:  Collaboration with [name], MD regarding development and update of comprehensive plan of care as evidenced by provider attestation and co-signature Inter-disciplinary care team collaboration (see longitudinal plan of care) Basic overview and discussion of pathophysiology of Heart Failure  reviewed  Provided verbal education on low sodium diet Provided written education on low sodium diet Reviewed Heart Failure Action Plan in depth and provided written copy Assessed need for readable accurate scales in home Provided education about placing scale on hard, flat surface Advised patient to weigh each morning after emptying bladder Discussed importance of daily weight and advised patient to weigh and record daily Reviewed role of diuretics  in prevention of fluid overload and management of heart failure Referral to pharmacist for assistance with cost of ursodiol Reviewed importance of light exercise and getting outdoors every day Reviewed all upcoming scheduled appointments Self-Care Activities: Attends all scheduled provider appointments Calls pharmacy for medication refills Patient Goals: - develop a rescue plan - eat more whole grains, fruits and vegetables, lean meats and healthy fats - follow rescue plan if symptoms flare-up - know when to call the doctor  - follow low sodium, heart healthy - look over education mailed- low sodium diet - take all medications as prescribed - expect a call from pharmacist about ursodiol- assistance with cost - continue to weight daily - make an activity or exercise plan - track weight in diary - use salt in moderation - watch for swelling in feet, ankles and legs every day - weigh myself daily Follow Up Plan: Telephone follow up appointment with care management team member scheduled for:   03/04/2021    Patient Care Plan: Diabetes Type 2 (Adult)     Problem Identified: Glycemic Management (Diabetes, Type 2)   Priority: High     Long-Range Goal: Glycemic Management Optimized   Start Date: 02/04/2021  Expected End Date: 08/04/2021  This Visit's Progress: On track  Priority: High  Note:   Objective:  Lab Results  Component Value Date   HGBA1C 10.3 (H) 08/26/2020   Lab Results  Component Value Date   CREATININE 3.54 (H) 02/01/2021   CREATININE 3.65 (H) 11/29/2020   CREATININE 3.85 (H) 11/15/2020   No results found for: EGFR Current Barriers:  Knowledge Deficits related to basic Diabetes pathophysiology and self care/management- CBG fasting ranges 151-309,  random ranges all above 300 with most recent reading 346, pt does not follow special diet.   Knowledge Deficits related to medications used for management of diabetes- needs reinforcement Financial Constraints Does  not adhere to provider recommendations re:   ADA/ carbohydrate modified diet Unable to perform ADLs independently- uses cane and walker as needed Case Manager Clinical Goal(s):  patient will demonstrate improved adherence to prescribed treatment plan for diabetes self care/management as evidenced by: daily monitoring and recording of CBG  adherence to ADA/ carb modified diet adherence to prescribed medication regimen contacting provider for new or worsened symptoms or questions Interventions:  Collaboration with [name], MD regarding development and update of comprehensive plan of care as evidenced by provider attestation and co-signature Inter-disciplinary care team collaboration (see longitudinal plan of care) Provided education to patient about basic DM disease process Reviewed medications with patient and discussed importance of medication adherence Discussed plans with patient for ongoing care management follow up and provided patient with direct contact information for care management team Provided patient with written educational materials related to hypo and hyperglycemia and importance of correct treatment Reviewed scheduled/upcoming provider appointments including: Dr. Sharol Given 11/15,  [facility] 05/06/21 Referral made to pharmacy team for assistance with medication costs - ursodiol Review of patient status, including review of consultants reports, relevant laboratory and other test results, and medications completed. Mailed education - hypoglycemia  Reviewed ADA/ carbohydrate modified diet Encouraged pt to avoid sugary soft drinks Reviewed effects of uncontrolled diabetes on overall health Self-Care Activities  Self administers oral medications as prescribed Attends all scheduled provider appointments Checks blood sugars as prescribed and utilize hyper and hypoglycemia protocol as needed Adheres to prescribed ADA/carb modified Patient Goals: - check blood sugar at  prescribed times - check blood sugar if I feel it is too high or too low - enter blood sugar readings and medication or insulin into daily log - take the blood sugar log to all doctor visits - take the blood sugar meter to all doctor visits  - look over information mailed to you - hypoglycemia - please practice portion control and be mindful of carbohydrate intake - please avoid sugary drinks and concentrated sweets - change to whole grain breads, cereal, pasta - fill half of plate with vegetables - limit fast food meals to no more than 1 per week - manage portion size - prepare main meal at home 3 to 5 days each week - read food labels for fat, fiber, carbohydrates and portion size - switch to sugar-free drinks - check feet daily for cuts, sores or redness - keep feet up while sitting - trim toenails straight across - wash and dry feet carefully every day - wear comfortable, cotton socks - wear comfortable, well-fitting shoes Follow Up Plan: Telephone follow up appointment with care management team member scheduled for:   03/04/2021

## 2021-02-04 NOTE — Chronic Care Management (AMB) (Signed)
Chronic Care Management   CCM RN Visit Note  02/04/2021 Name: Susan [name] MRN: [id number] DOB: [birth year]  Subjective: Susan [name] is a 70 y.o. year old female who is a primary care patient of [name], [name], MD. The care management team was consulted for assistance with disease management and care coordination needs.    Engaged with patient by telephone for initial visit in response to provider referral for case management and/or care coordination services.   Consent to Services:  The patient was given the following information about Chronic Care Management services today, agreed to services, and gave verbal consent: 1. CCM service includes personalized support from designated clinical staff supervised by the primary care provider, including individualized plan of care and coordination with other care providers 2. 24/7 contact phone numbers for assistance for urgent and routine care needs. 3. Service will only be billed when office clinical staff spend 20 minutes or more in a month to coordinate care. 4. Only one practitioner may furnish and bill the service in a calendar month. 5.The patient may stop CCM services at any time (effective at the end of the month) by phone call to the office staff. 6. The patient will be responsible for cost sharing (co-pay) of up to 20% of the service fee (after annual deductible is met). Patient agreed to services and consent obtained.  Patient agreed to services and verbal consent obtained.   Assessment: Review of patient past medical history, allergies, medications, health status, including review of consultants reports, laboratory and other test data, was performed as part of comprehensive evaluation and provision of chronic care management services.   SDOH (Social Determinants of Health) assessments and interventions performed:  SDOH Interventions    Flowsheet Row Most Recent Value  SDOH Interventions   Food Insecurity Interventions Intervention Not  Indicated  Transportation Interventions Intervention Not Indicated        CCM Care Plan  Allergies  Allergen Reactions   Drug Ingredient [Cephalexin] Diarrhea    Pt reports heavy diarrhea with use of keflex   Codeine Nausea And Vomiting    Outpatient Encounter Medications as of 02/04/2021  Medication Sig   acetaminophen (TYLENOL) 500 MG tablet Take 1,000 mg by mouth every 6 (six) hours as needed for moderate pain or headache.   albuterol (VENTOLIN HFA) 108 (90 Base) MCG/ACT inhaler TAKE 2 PUFFS BY MOUTH EVERY 6 HOURS AS NEEDED FOR WHEEZE OR SHORTNESS OF BREATH   allopurinol (ZYLOPRIM) 100 MG tablet TAKE 1 TABLET BY MOUTH TWICE A DAY   Ascorbic Acid (VITAMIN C) 1000 MG tablet Take 1,000 mg by mouth daily.   aspirin EC 81 MG tablet Take 1 tablet (81 mg total) by mouth daily with breakfast.   carvedilol (COREG) 12.5 MG tablet TAKE 1 TABLET (12.5 MG TOTAL) BY MOUTH 2 (TWO) TIMES DAILY WITH A MEAL.   cephALEXin (KEFLEX) 250 MG capsule Take 1 capsule (250 mg total) by mouth daily.   cholecalciferol (VITAMIN D) 1000 units tablet Take 1,000 Units by mouth daily with supper.    clopidogrel (PLAVIX) 75 MG tablet TAKE 1 TABLET BY MOUTH DAILY WITH BREAKFAST.   ferrous sulfate 325 (65 FE) MG tablet Take 1 tablet (325 mg total) by mouth 2 (two) times daily with a meal. Reported on 08/21/2015   FLUoxetine (PROZAC) 20 MG capsule TAKE 1 CAPSULE BY MOUTH EVERY DAY   Insulin Disposable Pump (OMNIPOD DASH 5 PACK PODS) MISC Inject into the skin See admin instructions. Use continuously with Novolin R -  change every 72 hours.   insulin NPH Human (HUMULIN N,NOVOLIN N) 100 UNIT/ML injection Inject 15 Units into the skin See admin instructions. Inject 15 units subcutaneously in the morning if CBG >300;   insulin regular (NOVOLIN R) 100 units/mL injection Inject into the skin See admin instructions. Manually add bolus to continuous dose via OmniPod 3 times daily per sliding scale (CBG 80-150 7 units, 151-200 9  units, 201-250 12 units, 251-300 14 units, 301- 400 17 units)   levothyroxine (SYNTHROID, LEVOTHROID) 50 MCG tablet Take 1 tablet (50 mcg total) by mouth daily before breakfast.   magnesium oxide (MAG-OX) 400 MG tablet TAKE 1 TABLET BY MOUTH EVERY DAY   Multiple Vitamins-Minerals (AIRBORNE GUMMIES PO) Take 2 tablets by mouth every evening.   Multiple Vitamins-Minerals (CENTRUM SILVER 50+WOMEN) TABS Take 1 tablet by mouth every evening.   Multiple Vitamins-Minerals (ZINC PO) Take 1 tablet by mouth every evening.   nitroGLYCERIN (NITROSTAT) 0.4 MG SL tablet Place 1 tablet (0.4 mg total) under the tongue every 5 (five) minutes as needed for chest pain.   ONETOUCH VERIO test strip 3 (three) times daily.   polyethylene glycol (MIRALAX / GLYCOLAX) 17 g packet Take 17 g by mouth daily.   pregabalin (LYRICA) 150 MG capsule Take 1 capsule (150 mg total) by mouth 2 (two) times daily.   torsemide (DEMADEX) 20 MG tablet Take 100 mg by mouth 2 (two) times daily.   traZODone (DESYREL) 100 MG tablet Take 100 mg by mouth as needed for sleep. Patient takes 2 tablets by mouth as needed.   ursodiol (ACTIGALL) 500 MG tablet Take 500 mg by mouth 3 (three) times daily.   guaiFENesin-dextromethorphan (ROBITUSSIN DM) 100-10 MG/5ML syrup Take 10 mLs by mouth every 4 (four) hours as needed for cough. (Patient not taking: Reported on 02/04/2021)   No facility-administered encounter medications on file as of 02/04/2021.    Patient Active Problem List   Diagnosis Date Noted   Acute renal failure superimposed on stage 4 chronic kidney disease ([place]) 08/22/2020   Hypoalbuminemia 05/25/2020   GERD (gastroesophageal reflux disease) 05/25/2020   Pressure injury of skin 05/17/2020   Type 2 diabetes mellitus with diabetic polyneuropathy, with long-term current use of insulin ([place]) 03/07/2020   Obesity, Class III, BMI 40-49.9 (morbid obesity) ([place]) 03/07/2020   Common bile duct (CBD) obstruction 05/28/2019   Benign neoplasm of  ascending colon    Benign neoplasm of transverse colon    Benign neoplasm of descending colon    Benign neoplasm of sigmoid colon    Gastric polyps    Hyperkalemia 03/11/2019   Prolonged QT interval 03/11/2019   Onychomycosis 06/21/2018   Osteomyelitis of second toe of right foot ([place])    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 ([facility])    PVD (peripheral vascular disease) ([place]) 10/26/2017   E-coli UTI 07/27/2017   Hypothyroidism 07/27/2017   PAH (pulmonary artery hypertension) ([facility])    Impaired ambulation 07/19/2017   Leg cramps 02/27/2017   Peripheral edema 01/12/2017   Diabetic neuropathy ([place]) 11/12/2016   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 mellitus ([place]) 10/24/2015   Anemia 10/03/2015   Generalized anxiety disorder 10/03/2015   Insomnia 10/03/2015   Hyperglycemia due to diabetes mellitus ([place]) 06/07/2015   Chronic diastolic CHF (congestive heart failure) ([place]) 06/07/2015   Non compliance with medical treatment 04/17/2014   Rotator cuff tear 03/14/2014   Class 3 obesity 09/23/2013   Hypotension 12/25/2012   Urinary incontinence  MDD (major depressive disorder) 11/12/2010   RBBB (right bundle branch block)    Coronary artery disease involving native coronary artery of native heart without angina pectoris    Hyperlipemia 01/22/2009   Essential hypertension 01/22/2009    Conditions to be addressed/monitored:CHF and DMII  Care Plan : Heart Failure (Adult)  Updates made by Kassie Mends, RN since 02/04/2021 12:00 AM     Problem: Symptom Exacerbation (Heart Failure)   Priority: High     Long-Range Goal: Symptom Exacerbation Prevented or Minimized   Start Date: 02/04/2021  Expected End Date: 08/04/2021  This Visit's Progress: On track  Priority: High  Note:   Current Barriers:  Knowledge deficit related to basic heart failure pathophysiology and self care management- pt lives with spouse, pt is independent in most all aspects of her care, has walker  and cane to use as needed, continues to drive. Reports does not adhere to a special diet, pt does weigh daily and record, checks BP per doctor instructions as has been elevated recently. Financial strain- cannot afford ursodiol (290$ per month), would like pharmacist to outreach Does not adhere to provider recommendations re: low sodium, heart healthy diet, does not exercise Case Manager Clinical Goal(s):  patient will weigh self daily and record patient will verbalize understanding of Heart Failure Action Plan and when to call doctor patient will take all Heart Failure mediations as prescribed patient will weigh daily and record (notifying MD of 3 lb weight gain over night or 5 lb in a week) Interventions:  Collaboration with [name], MD regarding development and update of comprehensive plan of care as evidenced by provider attestation and co-signature Inter-disciplinary care team collaboration (see longitudinal plan of care) Basic overview and discussion of pathophysiology of Heart Failure reviewed  Provided verbal education on low sodium diet Provided written education on low sodium diet Reviewed Heart Failure Action Plan in depth and provided written copy Assessed need for readable accurate scales in home Provided education about placing scale on hard, flat surface Advised patient to weigh each morning after emptying bladder Discussed importance of daily weight and advised patient to weigh and record daily Reviewed role of diuretics in prevention of fluid overload and management of heart failure Referral to pharmacist for assistance with cost of ursodiol Reviewed importance of light exercise and getting outdoors every day Reviewed all upcoming scheduled appointments Self-Care Activities: Attends all scheduled provider appointments Calls pharmacy for medication refills Patient Goals: - develop a rescue plan - eat more whole grains, fruits and vegetables, lean meats and  healthy fats - follow rescue plan if symptoms flare-up - know when to call the doctor  - follow low sodium, heart healthy - look over education mailed- low sodium diet - take all medications as prescribed - expect a call from pharmacist about ursodiol- assistance with cost - continue to weight daily - make an activity or exercise plan - track weight in diary - use salt in moderation - watch for swelling in feet, ankles and legs every day - weigh myself daily Follow Up Plan: Telephone follow up appointment with care management team member scheduled for:   03/04/2021    Care Plan : Diabetes Type 2 (Adult)  Updates made by Kassie Mends, RN since 02/04/2021 12:00 AM     Problem: Glycemic Management (Diabetes, Type 2)   Priority: High     Long-Range Goal: Glycemic Management Optimized   Start Date: 02/04/2021  Expected End Date: 08/04/2021  This Visit's Progress:  On track  Priority: High  Note:   Objective:  Lab Results  Component Value Date   HGBA1C 10.3 (H) 08/26/2020   Lab Results  Component Value Date   CREATININE 3.54 (H) 02/01/2021   CREATININE 3.65 (H) 11/29/2020   CREATININE 3.85 (H) 11/15/2020   No results found for: EGFR Current Barriers:  Knowledge Deficits related to basic Diabetes pathophysiology and self care/management- CBG fasting ranges 151-309,  random ranges all above 300 with most recent reading 346, pt does not follow special diet.   Knowledge Deficits related to medications used for management of diabetes- needs reinforcement Financial Constraints Does not adhere to provider recommendations re:   ADA/ carbohydrate modified diet Unable to perform ADLs independently- uses cane and walker as needed Case Manager Clinical Goal(s):  patient will demonstrate improved adherence to prescribed treatment plan for diabetes self care/management as evidenced by: daily monitoring and recording of CBG  adherence to ADA/ carb modified diet adherence to prescribed  medication regimen contacting provider for new or worsened symptoms or questions Interventions:  Collaboration with [name], MD regarding development and update of comprehensive plan of care as evidenced by provider attestation and co-signature Inter-disciplinary care team collaboration (see longitudinal plan of care) Provided education to patient about basic DM disease process Reviewed medications with patient and discussed importance of medication adherence Discussed plans with patient for ongoing care management follow up and provided patient with direct contact information for care management team Provided patient with written educational materials related to hypo and hyperglycemia and importance of correct treatment Reviewed scheduled/upcoming provider appointments including: Dr. Sharol Given 11/15,  [facility] 05/06/21 Referral made to pharmacy team for assistance with medication costs - ursodiol Review of patient status, including review of consultants reports, relevant laboratory and other test results, and medications completed. Mailed education - hypoglycemia Reviewed ADA/ carbohydrate modified diet Encouraged pt to avoid sugary soft drinks Reviewed effects of uncontrolled diabetes on overall health Self-Care Activities  Self administers oral medications as prescribed Attends all scheduled provider appointments Checks blood sugars as prescribed and utilize hyper and hypoglycemia protocol as needed Adheres to prescribed ADA/carb modified Patient Goals: - check blood sugar at prescribed times - check blood sugar if I feel it is too high or too low - enter blood sugar readings and medication or insulin into daily log - take the blood sugar log to all doctor visits - take the blood sugar meter to all doctor visits  - look over information mailed to you - hypoglycemia - please practice portion control and be mindful of carbohydrate intake - please avoid sugary drinks and  concentrated sweets - change to whole grain breads, cereal, pasta - fill half of plate with vegetables - limit fast food meals to no more than 1 per week - manage portion size - prepare main meal at home 3 to 5 days each week - read food labels for fat, fiber, carbohydrates and portion size - switch to sugar-free drinks - check feet daily for cuts, sores or redness - keep feet up while sitting - trim toenails straight across - wash and dry feet carefully every day - wear comfortable, cotton socks - wear comfortable, well-fitting shoes Follow Up Plan: Telephone follow up appointment with care management team member scheduled for:   03/04/2021     Plan:Telephone follow up appointment with care management team member scheduled for:  03/04/2021  Jacqlyn Larsen [place], BSN RN Case Manager [facility] [phone]

## 2021-02-05 ENCOUNTER — Telehealth: Payer: Self-pay | Admitting: Family Medicine

## 2021-02-05 NOTE — Chronic Care Management (AMB) (Signed)
  Chronic Care Management   Outreach Note  02/05/2021 Name: Susan [name] MRN: [id number] DOB: 1951/03/04  Referred by: [name], MD Reason for referral : No chief complaint on file.   An unsuccessful telephone outreach was attempted today. The patient was referred to the pharmacist for assistance with care management and care coordination.   Follow Up Plan:   Tatjana Dellinger Up[facility] Scheduler

## 2021-02-05 NOTE — Progress Notes (Signed)
Pt is scheduled for CCM apt 05/09/21 . Can we reach out to assist with rx cost prior to visit?  Thanks  Tati

## 2021-02-05 NOTE — Progress Notes (Signed)
  Chronic Care Management   Note  02/05/2021 Name: Triston Lisanti MRN: [id number] DOB: December 14, 1950  Cynara Tatham is a 70 y.o. year old female who is a primary care patient of [name], MD. I reached out to [name] by phone today in response to a referral sent by Ms. Vernell Barrier PCP, [name], MD.   Ms. Underberg was given information about Chronic Care Management services today including:  CCM service includes personalized support from designated clinical staff supervised by her physician, including individualized plan of care and coordination with other care providers 24/7 contact phone numbers for assistance for urgent and routine care needs. Service will only be billed when office clinical staff spend 20 minutes or more in a month to coordinate care. Only one practitioner may furnish and bill the service in a calendar month. The patient may stop CCM services at any time (effective at the end of the month) by phone call to the office staff.   Patient agreed to services and verbal consent obtained.   Follow up plan:   Tatjana Secretary/administrator

## 2021-02-06 ENCOUNTER — Other Ambulatory Visit (HOSPITAL_COMMUNITY): Payer: Self-pay | Admitting: Adult Health

## 2021-02-06 DIAGNOSIS — R0602 Shortness of breath: Secondary | ICD-10-CM

## 2021-02-07 DIAGNOSIS — G609 Hereditary and idiopathic neuropathy, unspecified: Secondary | ICD-10-CM | POA: Diagnosis not present

## 2021-02-07 DIAGNOSIS — R809 Proteinuria, unspecified: Secondary | ICD-10-CM | POA: Diagnosis not present

## 2021-02-07 DIAGNOSIS — E1165 Type 2 diabetes mellitus with hyperglycemia: Secondary | ICD-10-CM | POA: Diagnosis not present

## 2021-02-07 DIAGNOSIS — I10 Essential (primary) hypertension: Secondary | ICD-10-CM | POA: Diagnosis not present

## 2021-02-07 DIAGNOSIS — E11621 Type 2 diabetes mellitus with foot ulcer: Secondary | ICD-10-CM | POA: Diagnosis not present

## 2021-02-07 DIAGNOSIS — E039 Hypothyroidism, unspecified: Secondary | ICD-10-CM | POA: Diagnosis not present

## 2021-02-07 DIAGNOSIS — E11319 Type 2 diabetes mellitus with unspecified diabetic retinopathy without macular edema: Secondary | ICD-10-CM | POA: Diagnosis not present

## 2021-02-07 DIAGNOSIS — Z9641 Presence of insulin pump (external) (internal): Secondary | ICD-10-CM | POA: Diagnosis not present

## 2021-02-07 DIAGNOSIS — R748 Abnormal levels of other serum enzymes: Secondary | ICD-10-CM | POA: Diagnosis not present

## 2021-02-07 DIAGNOSIS — N189 Chronic kidney disease, unspecified: Secondary | ICD-10-CM | POA: Diagnosis not present

## 2021-02-07 DIAGNOSIS — Z23 Encounter for immunization: Secondary | ICD-10-CM | POA: Diagnosis not present

## 2021-02-15 DIAGNOSIS — I5032 Chronic diastolic (congestive) heart failure: Secondary | ICD-10-CM | POA: Diagnosis not present

## 2021-02-22 DIAGNOSIS — E1142 Type 2 diabetes mellitus with diabetic polyneuropathy: Secondary | ICD-10-CM

## 2021-02-22 DIAGNOSIS — Z794 Long term (current) use of insulin: Secondary | ICD-10-CM | POA: Diagnosis not present

## 2021-02-22 DIAGNOSIS — I5032 Chronic diastolic (congestive) heart failure: Secondary | ICD-10-CM | POA: Diagnosis not present

## 2021-02-26 ENCOUNTER — Telehealth (HOSPITAL_COMMUNITY): Payer: Self-pay | Admitting: *Deleted

## 2021-02-26 DIAGNOSIS — I5032 Chronic diastolic (congestive) heart failure: Secondary | ICD-10-CM

## 2021-02-26 NOTE — Telephone Encounter (Signed)
Pt called c/o cramping in legs x1 day. Also c/o shortness of breath, weight gain 4-5lbs in a few days, and swelling in legs. Pt will come for BMET tomorrow before we make any med changes per Janett Billow [place],FNP.  Pt aware and labs scheduled.

## 2021-02-27 ENCOUNTER — Other Ambulatory Visit (HOSPITAL_COMMUNITY): Payer: HMO

## 2021-02-27 ENCOUNTER — Telehealth: Payer: Self-pay | Admitting: Pharmacist

## 2021-02-27 NOTE — Progress Notes (Signed)
Chronic Care Management Pharmacy Assistant   Name: Susan [name]  MRN: [id number] DOB: 04/27/1951  Reason for Encounter: PAP   Medications: Outpatient Encounter Medications as of 02/27/2021  Medication Sig   acetaminophen (TYLENOL) 500 MG tablet Take 1,000 mg by mouth every 6 (six) hours as needed for moderate pain or headache.   albuterol (VENTOLIN HFA) 108 (90 Base) MCG/ACT inhaler TAKE 2 PUFFS BY MOUTH EVERY 6 HOURS AS NEEDED FOR WHEEZE OR SHORTNESS OF BREATH   allopurinol (ZYLOPRIM) 100 MG tablet TAKE 1 TABLET BY MOUTH TWICE A DAY   Ascorbic Acid (VITAMIN C) 1000 MG tablet Take 1,000 mg by mouth daily.   aspirin EC 81 MG tablet Take 1 tablet (81 mg total) by mouth daily with breakfast.   carvedilol (COREG) 12.5 MG tablet TAKE 1 TABLET (12.5 MG TOTAL) BY MOUTH 2 (TWO) TIMES DAILY WITH A MEAL.   cephALEXin (KEFLEX) 250 MG capsule Take 1 capsule (250 mg total) by mouth daily.   cholecalciferol (VITAMIN D) 1000 units tablet Take 1,000 Units by mouth daily with supper.    clopidogrel (PLAVIX) 75 MG tablet TAKE 1 TABLET BY MOUTH DAILY WITH BREAKFAST.   ferrous sulfate 325 (65 FE) MG tablet Take 1 tablet (325 mg total) by mouth 2 (two) times daily with a meal. Reported on 08/21/2015   FLUoxetine (PROZAC) 20 MG capsule TAKE 1 CAPSULE BY MOUTH EVERY DAY   guaiFENesin-dextromethorphan (ROBITUSSIN DM) 100-10 MG/5ML syrup Take 10 mLs by mouth every 4 (four) hours as needed for cough. (Patient not taking: Reported on 02/04/2021)   Insulin Disposable Pump (OMNIPOD DASH 5 PACK PODS) MISC Inject into the skin See admin instructions. Use continuously with Novolin R - change every 72 hours.   insulin NPH Human (HUMULIN N,NOVOLIN N) 100 UNIT/ML injection Inject 15 Units into the skin See admin instructions. Inject 15 units subcutaneously in the morning if CBG >300;   insulin regular (NOVOLIN R) 100 units/mL injection Inject into the skin See admin instructions. Manually add bolus to continuous dose via  OmniPod 3 times daily per sliding scale (CBG 80-150 7 units, 151-200 9 units, 201-250 12 units, 251-300 14 units, 301- 400 17 units)   levothyroxine (SYNTHROID, LEVOTHROID) 50 MCG tablet Take 1 tablet (50 mcg total) by mouth daily before breakfast.   magnesium oxide (MAG-OX) 400 MG tablet TAKE 1 TABLET BY MOUTH EVERY DAY   Multiple Vitamins-Minerals (AIRBORNE GUMMIES PO) Take 2 tablets by mouth every evening.   Multiple Vitamins-Minerals (CENTRUM SILVER 50+WOMEN) TABS Take 1 tablet by mouth every evening.   Multiple Vitamins-Minerals (ZINC PO) Take 1 tablet by mouth every evening.   nitroGLYCERIN (NITROSTAT) 0.4 MG SL tablet PLACE 1 TABLET (0.4 MG TOTAL) UNDER THE TONGUE EVERY 5 (FIVE) MINUTES AS NEEDED FOR CHEST PAIN.   ONETOUCH VERIO test strip 3 (three) times daily.   polyethylene glycol (MIRALAX / GLYCOLAX) 17 g packet Take 17 g by mouth daily.   pregabalin (LYRICA) 150 MG capsule Take 1 capsule (150 mg total) by mouth 2 (two) times daily.   torsemide (DEMADEX) 20 MG tablet Take 100 mg by mouth 2 (two) times daily.   traZODone (DESYREL) 100 MG tablet Take 100 mg by mouth as needed for sleep. Patient takes 2 tablets by mouth as needed.   ursodiol (ACTIGALL) 500 MG tablet Take 500 mg by mouth 3 (three) times daily.   No facility-administered encounter medications on file as of 02/27/2021.   Patient needed patient assistance on ursodiol 500 mg. There  are no active patient assistance program for this medication so I informed the patient about GoodRx and the coupons would help the patient greatly but I was unable to send the coupons to the patients phone because of network/connections. Informed the patient once she goes into town and goes to her nearest pharmacy to give me a call and I will forward the coupon to her cellular device. She voiced understanding.  Follow-Up:Pharmacist Review  Charlann Lange, [place] Pharmacist Assistant [phone]

## 2021-03-02 ENCOUNTER — Other Ambulatory Visit: Payer: Self-pay | Admitting: Family Medicine

## 2021-03-04 ENCOUNTER — Ambulatory Visit (INDEPENDENT_AMBULATORY_CARE_PROVIDER_SITE_OTHER): Payer: HMO | Admitting: *Deleted

## 2021-03-04 DIAGNOSIS — Z794 Long term (current) use of insulin: Secondary | ICD-10-CM

## 2021-03-04 DIAGNOSIS — I5032 Chronic diastolic (congestive) heart failure: Secondary | ICD-10-CM

## 2021-03-04 DIAGNOSIS — E1142 Type 2 diabetes mellitus with diabetic polyneuropathy: Secondary | ICD-10-CM

## 2021-03-04 NOTE — Patient Instructions (Signed)
Visit Information  PATIENT GOALS:  Goals Addressed             This Visit's Progress    Monitor and Manage My Blood Sugar-Diabetes Type 2       Timeframe:  Long-Range Goal Priority:  High Start Date:            02/04/2021                 Expected End Date:    08/04/2021                   Follow Up Date 03/18/2021   - continue to check blood sugar at prescribed times - check blood sugar if I feel it is too high or too low - enter blood sugar readings into a log and take to all doctors visits - take the blood sugar meter to all doctor visits  - please practice portion control and be mindful of carbohydrate intake - please avoid sugary drinks and concentrated sweets - please call primary care provider today and make an appointment for tomorrow if possible due to having cold like symptoms and elevated blood sugar   Why is this important?   Checking your blood sugar at home helps to keep it from getting very high or very low.  Writing the results in a diary or log helps the doctor know how to care for you.  Your blood sugar log should have the time, date and the results.  Also, write down the amount of insulin or other medicine that you take.  Other information, like what you ate, exercise done and how you were feeling, will also be helpful.     Notes:      Track and Manage Symptoms-Heart Failure       Timeframe:  Long-Range Goal Priority:  High Start Date:             02/04/2021                Expected End Date:     08/04/2021                  Follow Up Date 03/18/2021   - continue to follow rescue plan - eat more whole grains, fruits and vegetables, lean meats and healthy fats - follow rescue plan if symptoms flare-up - know when to call the doctor  - follow low sodium, heart healthy- read labels, avoid fast food and salty snacks - take all medications as prescribed - continue to weigh daily   Why is this important?   You will be able to handle your symptoms better if you  keep track of them.  Making some simple changes to your lifestyle will help.  Eating healthy is one thing you can do to take good care of yourself.    Notes:         The patient verbalized understanding of instructions, educational materials, and care plan provided today and declined offer to receive copy of patient instructions, educational materials, and care plan.   Telephone outreach scheduled for:  03/18/2021  Jacqlyn Larsen RNC, BSN RN Case Manager [facility] [phone]

## 2021-03-04 NOTE — Chronic Care Management (AMB) (Signed)
Chronic Care Management   CCM RN Visit Note  03/04/2021 Name: Susan [name] MRN: [id number] DOB: 1950/09/16  Subjective: Susan [name] is a 70 y.o. year old female who is a primary care patient of [name], [name], MD. The care management team was consulted for assistance with disease management and care coordination needs.    Engaged with patient by telephone for follow up visit in response to provider referral for case management and/or care coordination services.   Consent to Services:  The patient was given information about Chronic Care Management services, agreed to services, and gave verbal consent prior to initiation of services.  Please see initial visit note for detailed documentation.   Patient agreed to services and verbal consent obtained.   Assessment: Review of patient past medical history, allergies, medications, health status, including review of consultants reports, laboratory and other test data, was performed as part of comprehensive evaluation and provision of chronic care management services.   SDOH (Social Determinants of Health) assessments and interventions performed:    CCM Care Plan  Allergies  Allergen Reactions   Drug Ingredient [Cephalexin] Diarrhea    Pt reports heavy diarrhea with use of keflex   Codeine Nausea And Vomiting    Outpatient Encounter Medications as of 03/04/2021  Medication Sig   acetaminophen (TYLENOL) 500 MG tablet Take 1,000 mg by mouth every 6 (six) hours as needed for moderate pain or headache.   albuterol (VENTOLIN HFA) 108 (90 Base) MCG/ACT inhaler TAKE 2 PUFFS BY MOUTH EVERY 6 HOURS AS NEEDED FOR WHEEZE OR SHORTNESS OF BREATH   allopurinol (ZYLOPRIM) 100 MG tablet TAKE 1 TABLET BY MOUTH TWICE A DAY   Ascorbic Acid (VITAMIN C) 1000 MG tablet Take 1,000 mg by mouth daily.   aspirin EC 81 MG tablet Take 1 tablet (81 mg total) by mouth daily with breakfast.   carvedilol (COREG) 12.5 MG tablet TAKE 1 TABLET (12.5 MG TOTAL) BY MOUTH  2 (TWO) TIMES DAILY WITH A MEAL.   cephALEXin (KEFLEX) 250 MG capsule TAKE 1 CAPSULE BY MOUTH DAILY.   cholecalciferol (VITAMIN D) 1000 units tablet Take 1,000 Units by mouth daily with supper.    clopidogrel (PLAVIX) 75 MG tablet TAKE 1 TABLET BY MOUTH DAILY WITH BREAKFAST.   ferrous sulfate 325 (65 FE) MG tablet Take 1 tablet (325 mg total) by mouth 2 (two) times daily with a meal. Reported on 08/21/2015   FLUoxetine (PROZAC) 20 MG capsule TAKE 1 CAPSULE BY MOUTH EVERY DAY   guaiFENesin-dextromethorphan (ROBITUSSIN DM) 100-10 MG/5ML syrup Take 10 mLs by mouth every 4 (four) hours as needed for cough. (Patient not taking: Reported on 02/04/2021)   Insulin Disposable Pump (OMNIPOD DASH 5 PACK PODS) MISC Inject into the skin See admin instructions. Use continuously with Novolin R - change every 72 hours.   insulin NPH Human (HUMULIN N,NOVOLIN N) 100 UNIT/ML injection Inject 15 Units into the skin See admin instructions. Inject 15 units subcutaneously in the morning if CBG >300;   insulin regular (NOVOLIN R) 100 units/mL injection Inject into the skin See admin instructions. Manually add bolus to continuous dose via OmniPod 3 times daily per sliding scale (CBG 80-150 7 units, 151-200 9 units, 201-250 12 units, 251-300 14 units, 301- 400 17 units)   levothyroxine (SYNTHROID, LEVOTHROID) 50 MCG tablet Take 1 tablet (50 mcg total) by mouth daily before breakfast.   magnesium oxide (MAG-OX) 400 MG tablet TAKE 1 TABLET BY MOUTH EVERY DAY   Multiple Vitamins-Minerals (AIRBORNE GUMMIES PO) Take  2 tablets by mouth every evening.   Multiple Vitamins-Minerals (CENTRUM SILVER 50+WOMEN) TABS Take 1 tablet by mouth every evening.   Multiple Vitamins-Minerals (ZINC PO) Take 1 tablet by mouth every evening.   nitroGLYCERIN (NITROSTAT) 0.4 MG SL tablet PLACE 1 TABLET (0.4 MG TOTAL) UNDER THE TONGUE EVERY 5 (FIVE) MINUTES AS NEEDED FOR CHEST PAIN.   ONETOUCH VERIO test strip 3 (three) times daily.   polyethylene glycol  (MIRALAX / GLYCOLAX) 17 g packet Take 17 g by mouth daily.   pregabalin (LYRICA) 150 MG capsule Take 1 capsule (150 mg total) by mouth 2 (two) times daily.   torsemide (DEMADEX) 20 MG tablet Take 100 mg by mouth 2 (two) times daily.   traZODone (DESYREL) 100 MG tablet Take 100 mg by mouth as needed for sleep. Patient takes 2 tablets by mouth as needed.   ursodiol (ACTIGALL) 500 MG tablet Take 500 mg by mouth 3 (three) times daily.   No facility-administered encounter medications on file as of 03/04/2021.    Patient Active Problem List   Diagnosis Date Noted   Acute renal failure superimposed on stage 4 chronic kidney disease ([place]) 08/22/2020   Hypoalbuminemia 05/25/2020   GERD (gastroesophageal reflux disease) 05/25/2020   Pressure injury of skin 05/17/2020   Type 2 diabetes mellitus with diabetic polyneuropathy, with long-term current use of insulin ([place]) 03/07/2020   Obesity, Class III, BMI 40-49.9 (morbid obesity) ([place]) 03/07/2020   Common bile duct (CBD) obstruction 05/28/2019   Benign neoplasm of ascending colon    Benign neoplasm of transverse colon    Benign neoplasm of descending colon    Benign neoplasm of sigmoid colon    Gastric polyps    Hyperkalemia 03/11/2019   Prolonged QT interval 03/11/2019   Onychomycosis 06/21/2018   Osteomyelitis of second toe of right foot ([place])    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 ([facility])    PVD (peripheral vascular disease) ([place]) 10/26/2017   E-coli UTI 07/27/2017   Hypothyroidism 07/27/2017   PAH (pulmonary artery hypertension) ([facility])    Impaired ambulation 07/19/2017   Leg cramps 02/27/2017   Peripheral edema 01/12/2017   Diabetic neuropathy ([place]) 11/12/2016   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 mellitus ([place]) 10/24/2015   Anemia 10/03/2015   Generalized anxiety disorder 10/03/2015   Insomnia 10/03/2015   Hyperglycemia due to diabetes mellitus ([place]) 06/07/2015   Chronic diastolic CHF (congestive heart failure)  ([place]) 06/07/2015   Non compliance with medical treatment 04/17/2014   Rotator cuff tear 03/14/2014   Class 3 obesity ([place]) 09/23/2013   Hypotension 12/25/2012   Urinary incontinence    MDD (major depressive disorder) 11/12/2010   RBBB (right bundle branch block)    Coronary artery disease involving native coronary artery of native heart without angina pectoris    Hyperlipemia 01/22/2009   Essential hypertension 01/22/2009    Conditions to be addressed/monitored:CHF and DMII  Care Plan : Heart Failure (Adult)  Updates made by Kassie Mends, RN since 03/04/2021 12:00 AM     Problem: Symptom Exacerbation (Heart Failure)   Priority: High     Long-Range Goal: Symptom Exacerbation Prevented or Minimized   Start Date: 02/04/2021  Expected End Date: 08/04/2021  This Visit's Progress: Not on track  Recent Progress: On track  Priority: High  Note:   Current Barriers:  Knowledge deficit related to basic heart failure pathophysiology and self care management- pt lives with spouse, pt is independent in most all aspects of her  care, has walker and cane to use as needed, continues to drive. Reports does not adhere to a special diet, pt does weigh daily and record, checks BP per doctor instructions as has been elevated recently. Patient reports she has not weighed since last Friday due to not feeling like it, has been tired with cold like symptoms and elevated blood sugar, she has not contacted her doctor. Financial strain- cannot afford ursodiol (290$ per month), would like pharmacist to outreach Does not adhere to provider recommendations re: low sodium, heart healthy diet, does not exercise Case Manager Clinical Goal(s):  patient will weigh self daily and record patient will verbalize understanding of Heart Failure Action Plan and when to call doctor patient will take all Heart Failure mediations as prescribed patient will weigh daily and record (notifying MD of 3 lb weight gain over night  or 5 lb in a week) Interventions:  Collaboration with [name], MD regarding development and update of comprehensive plan of care as evidenced by provider attestation and co-signature Inter-disciplinary care team collaboration (see longitudinal plan of care) Basic overview and discussion of pathophysiology of Heart Failure reviewed  Reinforced verbal education on low sodium diet Reinforced Heart Failure Action Plan Provided education about placing scale on hard, flat surface Advised patient to weigh each morning after emptying bladder Reinforced importance of daily weight and advised patient to weigh and record daily Reviewed role of diuretics in prevention of fluid overload and management of heart failure Reviewed importance of light exercise and getting outdoors every day Reviewed all upcoming scheduled appointments Reviewed importance of contacting doctor early on for change in health status/ symtpoms Self-Care Activities: Attends all scheduled provider appointments Calls pharmacy for medication refills Patient Goals: - continue to follow rescue plan - eat more whole grains, fruits and vegetables, lean meats and healthy fats - follow rescue plan if symptoms flare-up - know when to call the doctor  - follow low sodium, heart healthy- read labels, avoid fast food and salty snacks - take all medications as prescribed - continue to weigh daily - make an activity or exercise plan - track weight in diary - use salt in moderation - watch for swelling in feet, ankles and legs every day - weigh myself daily Follow Up Plan: Telephone follow up appointment with care management team member scheduled for:   03/18/2021    Care Plan : Diabetes Type 2 (Adult)  Updates made by Kassie Mends, RN since 03/04/2021 12:00 AM     Problem: Glycemic Management (Diabetes, Type 2)   Priority: High     Long-Range Goal: Glycemic Management Optimized   Start Date: 02/04/2021  Expected End  Date: 08/04/2021  This Visit's Progress: Not on track  Recent Progress: On track  Priority: High  Note:   Objective:  Lab Results  Component Value Date   HGBA1C 10.3 (H) 08/26/2020   Lab Results  Component Value Date   CREATININE 3.54 (H) 02/01/2021   CREATININE 3.65 (H) 11/29/2020   CREATININE 3.85 (H) 11/15/2020   No results found for: EGFR Current Barriers:  Knowledge Deficits related to basic Diabetes pathophysiology and self care/management-  Patient reports she has felt sick since last Friday with cold like symptoms, cough and elevated blood sugar, states she has not called and reported to her doctor. CBG fasting 410 today, 336 yesterday evening random ranges all above 300 with some readings 400-500's range, pt does not follow special diet.   Knowledge Deficits related to medications used for  management of diabetes- needs reinforcement Financial Constraints Does not adhere to provider recommendations re:   ADA/ carbohydrate modified diet Unable to perform ADLs independently- uses cane and walker as needed Case Manager Clinical Goal(s):  patient will demonstrate improved adherence to prescribed treatment plan for diabetes self care/management as evidenced by: daily monitoring and recording of CBG  adherence to ADA/ carb modified diet adherence to prescribed medication regimen contacting provider for new or worsened symptoms or questions Interventions:  Collaboration with [name], MD regarding development and update of comprehensive plan of care as evidenced by provider attestation and co-signature Inter-disciplinary care team collaboration (see longitudinal plan of care) Provided education to patient about basic DM disease process Reviewed medications with patient and discussed importance of medication adherence Discussed plans with patient for ongoing care management follow up and provided patient with direct contact information for care management team Provided patient  with verbal educational related to hypo and hyperglycemia and importance of correct treatment Reviewed scheduled/upcoming provider appointments including: Dr. Sharol Given 11/15,  [facility] 05/06/21 Reminded patient referral made to pharmacy team for assistance with medication costs - ursodiol Review of patient status, including review of consultants reports, relevant laboratory and other test results, and medications completed Reinforced ADA/ carbohydrate modified diet Encouraged pt to avoid sugary soft drinks Reviewed effects of uncontrolled diabetes on overall health Ask patient to call primary care provider's office today and make appointment to be seen due to cold like symptoms, cough and elevated blood sugar Spoke with patient again who reports she did call primary care provider office, left a voicemail and awaiting return phone call In basket sent to primary care provider with update Self-Care Activities  Self administers oral medications as prescribed Attends all scheduled provider appointments Checks blood sugars as prescribed and utilize hyper and hypoglycemia protocol as needed Adheres to prescribed ADA/carb modified Patient Goals: - continue to check blood sugar at prescribed times - check blood sugar if I feel it is too high or too low - enter blood sugar readings into a log and take to all doctors visits - take the blood sugar meter to all doctor visits  - please practice portion control and be mindful of carbohydrate intake - please avoid sugary drinks and concentrated sweets - please call primary care provider today and make an appointment for tomorrow if possible due to having cold like symptoms and elevated blood sugar - change to whole grain breads, cereal, pasta - fill half of plate with vegetables - limit fast food meals to no more than 1 per week - manage portion size - prepare main meal at home 3 to 5 days each week - read food labels for fat, fiber,  carbohydrates and portion size - switch to sugar-free drinks - check feet daily for cuts, sores or redness - keep feet up while sitting - trim toenails straight across - wash and dry feet carefully every day - wear comfortable, cotton socks - wear comfortable, well-fitting shoes Follow Up Plan: Telephone follow up appointment with care management team member scheduled for:   03/18/2021     Plan:Telephone follow up appointment with care management team member scheduled for:  03/18/2021  Jacqlyn Larsen [facility], BSN RN Case Manager [facility] [phone]

## 2021-03-05 ENCOUNTER — Encounter (HOSPITAL_COMMUNITY): Payer: Self-pay

## 2021-03-05 ENCOUNTER — Emergency Department (HOSPITAL_COMMUNITY): Payer: HMO

## 2021-03-05 ENCOUNTER — Ambulatory Visit (INDEPENDENT_AMBULATORY_CARE_PROVIDER_SITE_OTHER): Payer: HMO | Admitting: Family Medicine

## 2021-03-05 ENCOUNTER — Inpatient Hospital Stay (HOSPITAL_COMMUNITY)
Admission: EM | Admit: 2021-03-05 | Discharge: 2021-03-10 | DRG: 280 | Disposition: A | Discharge status: Home Health | Payer: HMO | Attending: Internal Medicine | Admitting: Internal Medicine

## 2021-03-05 ENCOUNTER — Other Ambulatory Visit: Payer: Self-pay

## 2021-03-05 VITALS — BP 142/78 | HR 83 | Temp 97.6°F | Wt 275.0 lb

## 2021-03-05 DIAGNOSIS — Z9641 Presence of insulin pump (external) (internal): Secondary | ICD-10-CM | POA: Diagnosis present

## 2021-03-05 DIAGNOSIS — Z6841 Body Mass Index (BMI) 40.0 and over, adult: Secondary | ICD-10-CM | POA: Diagnosis not present

## 2021-03-05 DIAGNOSIS — Z7902 Long term (current) use of antithrombotics/antiplatelets: Secondary | ICD-10-CM

## 2021-03-05 DIAGNOSIS — E119 Type 2 diabetes mellitus without complications: Secondary | ICD-10-CM | POA: Diagnosis not present

## 2021-03-05 DIAGNOSIS — E785 Hyperlipidemia, unspecified: Secondary | ICD-10-CM | POA: Diagnosis not present

## 2021-03-05 DIAGNOSIS — I252 Old myocardial infarction: Secondary | ICD-10-CM

## 2021-03-05 DIAGNOSIS — Z7982 Long term (current) use of aspirin: Secondary | ICD-10-CM

## 2021-03-05 DIAGNOSIS — Z20822 Contact with and (suspected) exposure to covid-19: Secondary | ICD-10-CM | POA: Diagnosis not present

## 2021-03-05 DIAGNOSIS — Z9181 History of falling: Secondary | ICD-10-CM

## 2021-03-05 DIAGNOSIS — R197 Diarrhea, unspecified: Secondary | ICD-10-CM | POA: Diagnosis not present

## 2021-03-05 DIAGNOSIS — Z1619 Resistance to other specified beta lactam antibiotics: Secondary | ICD-10-CM | POA: Diagnosis present

## 2021-03-05 DIAGNOSIS — K5900 Constipation, unspecified: Secondary | ICD-10-CM | POA: Diagnosis present

## 2021-03-05 DIAGNOSIS — Z8249 Family history of ischemic heart disease and other diseases of the circulatory system: Secondary | ICD-10-CM

## 2021-03-05 DIAGNOSIS — R059 Cough, unspecified: Secondary | ICD-10-CM | POA: Diagnosis not present

## 2021-03-05 DIAGNOSIS — I2510 Atherosclerotic heart disease of native coronary artery without angina pectoris: Secondary | ICD-10-CM | POA: Diagnosis not present

## 2021-03-05 DIAGNOSIS — E1122 Type 2 diabetes mellitus with diabetic chronic kidney disease: Secondary | ICD-10-CM | POA: Diagnosis present

## 2021-03-05 DIAGNOSIS — I5022 Chronic systolic (congestive) heart failure: Secondary | ICD-10-CM | POA: Diagnosis not present

## 2021-03-05 DIAGNOSIS — E1142 Type 2 diabetes mellitus with diabetic polyneuropathy: Secondary | ICD-10-CM | POA: Diagnosis not present

## 2021-03-05 DIAGNOSIS — E8881 Metabolic syndrome: Secondary | ICD-10-CM | POA: Diagnosis present

## 2021-03-05 DIAGNOSIS — B9689 Other specified bacterial agents as the cause of diseases classified elsewhere: Secondary | ICD-10-CM | POA: Diagnosis present

## 2021-03-05 DIAGNOSIS — N179 Acute kidney failure, unspecified: Secondary | ICD-10-CM | POA: Diagnosis present

## 2021-03-05 DIAGNOSIS — Z885 Allergy status to narcotic agent status: Secondary | ICD-10-CM

## 2021-03-05 DIAGNOSIS — I214 Non-ST elevation (NSTEMI) myocardial infarction: Secondary | ICD-10-CM | POA: Diagnosis not present

## 2021-03-05 DIAGNOSIS — Z9842 Cataract extraction status, left eye: Secondary | ICD-10-CM

## 2021-03-05 DIAGNOSIS — I5033 Acute on chronic diastolic (congestive) heart failure: Secondary | ICD-10-CM | POA: Diagnosis not present

## 2021-03-05 DIAGNOSIS — R21 Rash and other nonspecific skin eruption: Secondary | ICD-10-CM | POA: Diagnosis present

## 2021-03-05 DIAGNOSIS — J90 Pleural effusion, not elsewhere classified: Secondary | ICD-10-CM | POA: Diagnosis not present

## 2021-03-05 DIAGNOSIS — N390 Urinary tract infection, site not specified: Secondary | ICD-10-CM | POA: Diagnosis present

## 2021-03-05 DIAGNOSIS — R079 Chest pain, unspecified: Secondary | ICD-10-CM | POA: Diagnosis not present

## 2021-03-05 DIAGNOSIS — I5043 Acute on chronic combined systolic (congestive) and diastolic (congestive) heart failure: Secondary | ICD-10-CM | POA: Diagnosis not present

## 2021-03-05 DIAGNOSIS — E039 Hypothyroidism, unspecified: Secondary | ICD-10-CM | POA: Diagnosis present

## 2021-03-05 DIAGNOSIS — F32A Depression, unspecified: Secondary | ICD-10-CM | POA: Diagnosis not present

## 2021-03-05 DIAGNOSIS — Z9114 Patient's other noncompliance with medication regimen: Secondary | ICD-10-CM

## 2021-03-05 DIAGNOSIS — I5032 Chronic diastolic (congestive) heart failure: Secondary | ICD-10-CM | POA: Diagnosis not present

## 2021-03-05 DIAGNOSIS — I130 Hypertensive heart and chronic kidney disease with heart failure and stage 1 through stage 4 chronic kidney disease, or unspecified chronic kidney disease: Secondary | ICD-10-CM | POA: Diagnosis not present

## 2021-03-05 DIAGNOSIS — R008 Other abnormalities of heart beat: Secondary | ICD-10-CM | POA: Diagnosis not present

## 2021-03-05 DIAGNOSIS — Z8673 Personal history of transient ischemic attack (TIA), and cerebral infarction without residual deficits: Secondary | ICD-10-CM

## 2021-03-05 DIAGNOSIS — E1151 Type 2 diabetes mellitus with diabetic peripheral angiopathy without gangrene: Secondary | ICD-10-CM | POA: Diagnosis not present

## 2021-03-05 DIAGNOSIS — Z955 Presence of coronary angioplasty implant and graft: Secondary | ICD-10-CM

## 2021-03-05 DIAGNOSIS — R300 Dysuria: Secondary | ICD-10-CM

## 2021-03-05 DIAGNOSIS — R0600 Dyspnea, unspecified: Secondary | ICD-10-CM | POA: Diagnosis not present

## 2021-03-05 DIAGNOSIS — Z79890 Hormone replacement therapy (postmenopausal): Secondary | ICD-10-CM

## 2021-03-05 DIAGNOSIS — F419 Anxiety disorder, unspecified: Secondary | ICD-10-CM | POA: Diagnosis present

## 2021-03-05 DIAGNOSIS — Z79899 Other long term (current) drug therapy: Secondary | ICD-10-CM

## 2021-03-05 DIAGNOSIS — E871 Hypo-osmolality and hyponatremia: Secondary | ICD-10-CM | POA: Diagnosis not present

## 2021-03-05 DIAGNOSIS — Z87891 Personal history of nicotine dependence: Secondary | ICD-10-CM

## 2021-03-05 DIAGNOSIS — Z794 Long term (current) use of insulin: Secondary | ICD-10-CM | POA: Diagnosis not present

## 2021-03-05 DIAGNOSIS — E1165 Type 2 diabetes mellitus with hyperglycemia: Secondary | ICD-10-CM | POA: Diagnosis not present

## 2021-03-05 DIAGNOSIS — N184 Chronic kidney disease, stage 4 (severe): Secondary | ICD-10-CM | POA: Diagnosis not present

## 2021-03-05 DIAGNOSIS — I4510 Unspecified right bundle-branch block: Secondary | ICD-10-CM | POA: Diagnosis present

## 2021-03-05 DIAGNOSIS — Z87440 Personal history of urinary (tract) infections: Secondary | ICD-10-CM

## 2021-03-05 DIAGNOSIS — E876 Hypokalemia: Secondary | ICD-10-CM | POA: Diagnosis present

## 2021-03-05 DIAGNOSIS — R0602 Shortness of breath: Secondary | ICD-10-CM | POA: Diagnosis not present

## 2021-03-05 DIAGNOSIS — Z90710 Acquired absence of both cervix and uterus: Secondary | ICD-10-CM

## 2021-03-05 DIAGNOSIS — Z881 Allergy status to other antibiotic agents status: Secondary | ICD-10-CM

## 2021-03-05 DIAGNOSIS — Z9841 Cataract extraction status, right eye: Secondary | ICD-10-CM

## 2021-03-05 DIAGNOSIS — M150 Primary generalized (osteo)arthritis: Secondary | ICD-10-CM | POA: Diagnosis present

## 2021-03-05 DIAGNOSIS — I5021 Acute systolic (congestive) heart failure: Secondary | ICD-10-CM | POA: Diagnosis not present

## 2021-03-05 LAB — CBC
HCT: 36.2 % (ref 36.0–46.0)
Hemoglobin: 11.4 g/dL — ABNORMAL LOW (ref 12.0–15.0)
MCH: 27 pg (ref 26.0–34.0)
MCHC: 31.5 g/dL (ref 30.0–36.0)
MCV: 85.8 fL (ref 80.0–100.0)
Platelets: 159 10*3/uL (ref 150–400)
RBC: 4.22 MIL/uL (ref 3.87–5.11)
RDW: 15.9 % — ABNORMAL HIGH (ref 11.5–15.5)
WBC: 9.9 10*3/uL (ref 4.0–10.5)
nRBC: 0 % (ref 0.0–0.2)

## 2021-03-05 LAB — TROPONIN I (HIGH SENSITIVITY)
Troponin I (High Sensitivity): 247 ng/L (ref <18)
Troponin I (High Sensitivity): 236 ng/L (ref <18)
Troponin I (High Sensitivity): 182 ng/L (ref <18)
Troponin I (High Sensitivity): 184 ng/L (ref <18)

## 2021-03-05 LAB — BASIC METABOLIC PANEL
Anion gap: 13 (ref 5–15)
BUN: 109 mg/dL — ABNORMAL HIGH (ref 8–23)
CO2: 25 mmol/L (ref 22–32)
Calcium: 8.9 mg/dL (ref 8.9–10.3)
Chloride: 93 mmol/L — ABNORMAL LOW (ref 98–111)
Creatinine, Ser: 4.01 mg/dL — ABNORMAL HIGH (ref 0.44–1.00)
GFR, Estimated: 11 mL/min — ABNORMAL LOW (ref >60)
Glucose, Bld: 339 mg/dL — ABNORMAL HIGH (ref 70–99)
Potassium: 3.7 mmol/L (ref 3.5–5.1)
Sodium: 131 mmol/L — ABNORMAL LOW (ref 135–145)

## 2021-03-05 LAB — CBG MONITORING, ED: Glucose-Capillary: 279 mg/dL — ABNORMAL HIGH (ref 70–99)

## 2021-03-05 LAB — RESP PANEL BY RT-PCR (FLU A&B, COVID) ARPGX2
Influenza A by PCR: NEGATIVE
Influenza B by PCR: NEGATIVE
SARS Coronavirus 2 by RT PCR: NEGATIVE

## 2021-03-05 LAB — BRAIN NATRIURETIC PEPTIDE: B Natriuretic Peptide: 584.6 pg/mL — ABNORMAL HIGH (ref 0.0–100.0)

## 2021-03-05 LAB — HEMOGLOBIN A1C
Hgb A1c MFr Bld: 12.2 % — ABNORMAL HIGH (ref 4.8–5.6)
Mean Plasma Glucose: 303.44 mg/dL

## 2021-03-05 LAB — PROTIME-INR
INR: 0.9 (ref 0.8–1.2)
Prothrombin Time: 12.1 seconds (ref 11.4–15.2)

## 2021-03-05 MED ORDER — ATORVASTATIN CALCIUM 80 MG PO TABS
80.0000 mg | ORAL_TABLET | Freq: Every day | ORAL | Status: DC
  Administered 2021-03-05 – 2021-03-10 (×6): 80 mg via ORAL
  Filled 2021-03-05 (×3): qty 1
  Filled 2021-03-05: qty 2
  Filled 2021-03-05: qty 1
  Filled 2021-03-05: qty 2

## 2021-03-05 MED ORDER — NITROGLYCERIN IN D5W 200-5 MCG/ML-% IV SOLN
0.0000 ug/min | INTRAVENOUS | Status: DC
  Administered 2021-03-05: 5 ug/min via INTRAVENOUS
  Filled 2021-03-05: qty 250

## 2021-03-05 MED ORDER — FLUOXETINE HCL 20 MG PO CAPS
20.0000 mg | ORAL_CAPSULE | Freq: Every day | ORAL | Status: DC
  Administered 2021-03-06 – 2021-03-10 (×5): 20 mg via ORAL
  Filled 2021-03-05 (×5): qty 1

## 2021-03-05 MED ORDER — PREGABALIN 75 MG PO CAPS
150.0000 mg | ORAL_CAPSULE | Freq: Two times a day (BID) | ORAL | Status: DC
  Administered 2021-03-05 – 2021-03-10 (×10): 150 mg via ORAL
  Filled 2021-03-05 (×9): qty 2
  Filled 2021-03-05: qty 1

## 2021-03-05 MED ORDER — TRAZODONE HCL 50 MG PO TABS: 100.0000 mg | ORAL_TABLET | ORAL | Status: DC | PRN

## 2021-03-05 MED ORDER — INSULIN PUMP
Freq: Three times a day (TID) | SUBCUTANEOUS | Status: DC
  Filled 2021-03-05: qty 1

## 2021-03-05 MED ORDER — CARVEDILOL 12.5 MG PO TABS
12.5000 mg | ORAL_TABLET | Freq: Two times a day (BID) | ORAL | Status: DC
  Administered 2021-03-05 – 2021-03-10 (×10): 12.5 mg via ORAL
  Filled 2021-03-05 (×10): qty 1

## 2021-03-05 MED ORDER — SODIUM CHLORIDE 0.9% FLUSH
3.0000 mL | Freq: Two times a day (BID) | INTRAVENOUS | Status: DC
  Administered 2021-03-05 – 2021-03-09 (×7): 3 mL via INTRAVENOUS

## 2021-03-05 MED ORDER — LINAGLIPTIN 5 MG PO TABS
5.0000 mg | ORAL_TABLET | Freq: Every day | ORAL | Status: DC
  Administered 2021-03-07 – 2021-03-10 (×4): 5 mg via ORAL
  Filled 2021-03-05 (×5): qty 1

## 2021-03-05 MED ORDER — INSULIN ASPART 100 UNIT/ML IJ SOLN: 0.0000 [IU] | Freq: Three times a day (TID) | INTRAMUSCULAR | Status: DC

## 2021-03-05 MED ORDER — INSULIN ASPART 100 UNIT/ML IJ SOLN
0.0000 [IU] | Freq: Three times a day (TID) | INTRAMUSCULAR | Status: DC
  Administered 2021-03-06: 7 [IU] via SUBCUTANEOUS
  Administered 2021-03-06: 5 [IU] via SUBCUTANEOUS

## 2021-03-05 MED ORDER — ASPIRIN EC 81 MG PO TBEC
81.0000 mg | DELAYED_RELEASE_TABLET | Freq: Every day | ORAL | Status: DC
  Administered 2021-03-06 – 2021-03-10 (×5): 81 mg via ORAL
  Filled 2021-03-05 (×5): qty 1

## 2021-03-05 MED ORDER — CEPHALEXIN 250 MG PO CAPS
250.0000 mg | ORAL_CAPSULE | Freq: Every day | ORAL | Status: DC
  Administered 2021-03-06 – 2021-03-07 (×2): 250 mg via ORAL
  Filled 2021-03-05 (×2): qty 1

## 2021-03-05 MED ORDER — SODIUM CHLORIDE 0.9 % IV SOLN
250.0000 mL | INTRAVENOUS | Status: DC | PRN
  Administered 2021-03-06 – 2021-03-10 (×2): 250 mL via INTRAVENOUS

## 2021-03-05 MED ORDER — FUROSEMIDE 10 MG/ML IJ SOLN: 20.0000 mg | Freq: Once | INTRAMUSCULAR | Status: DC

## 2021-03-05 MED ORDER — POLYETHYLENE GLYCOL 3350 17 G PO PACK
17.0000 g | PACK | Freq: Every day | ORAL | Status: DC
  Administered 2021-03-06 – 2021-03-07 (×2): 17 g via ORAL
  Filled 2021-03-05 (×3): qty 1

## 2021-03-05 MED ORDER — URSODIOL 300 MG PO CAPS
300.0000 mg | ORAL_CAPSULE | Freq: Three times a day (TID) | ORAL | Status: DC
  Administered 2021-03-06 – 2021-03-10 (×11): 300 mg via ORAL
  Filled 2021-03-05 (×16): qty 1

## 2021-03-05 MED ORDER — ALLOPURINOL 100 MG PO TABS
100.0000 mg | ORAL_TABLET | Freq: Two times a day (BID) | ORAL | Status: DC
  Administered 2021-03-05 – 2021-03-10 (×10): 100 mg via ORAL
  Filled 2021-03-05 (×11): qty 1

## 2021-03-05 MED ORDER — FERROUS SULFATE 325 (65 FE) MG PO TABS
325.0000 mg | ORAL_TABLET | Freq: Two times a day (BID) | ORAL | Status: DC
  Administered 2021-03-06 – 2021-03-10 (×9): 325 mg via ORAL
  Filled 2021-03-05 (×9): qty 1

## 2021-03-05 MED ORDER — CLOPIDOGREL BISULFATE 75 MG PO TABS
75.0000 mg | ORAL_TABLET | Freq: Every day | ORAL | Status: DC
  Administered 2021-03-06 – 2021-03-10 (×5): 75 mg via ORAL
  Filled 2021-03-05 (×5): qty 1

## 2021-03-05 MED ORDER — ACETAMINOPHEN 325 MG PO TABS: 650.0000 mg | ORAL_TABLET | ORAL | Status: DC | PRN

## 2021-03-05 MED ORDER — HEPARIN (PORCINE) 25000 UT/250ML-% IV SOLN
1600.0000 [IU]/h | INTRAVENOUS | Status: DC
  Administered 2021-03-05: 1050 [IU]/h via INTRAVENOUS
  Administered 2021-03-06: 1400 [IU]/h via INTRAVENOUS
  Filled 2021-03-05 (×2): qty 250

## 2021-03-05 MED ORDER — MAGNESIUM OXIDE -MG SUPPLEMENT 400 (240 MG) MG PO TABS
400.0000 mg | ORAL_TABLET | Freq: Every day | ORAL | Status: DC
  Administered 2021-03-06 – 2021-03-07 (×2): 400 mg via ORAL
  Filled 2021-03-05 (×2): qty 1

## 2021-03-05 MED ORDER — LEVOTHYROXINE SODIUM 50 MCG PO TABS
50.0000 ug | ORAL_TABLET | Freq: Every day | ORAL | Status: DC
  Administered 2021-03-06 – 2021-03-10 (×5): 50 ug via ORAL
  Filled 2021-03-05 (×2): qty 1
  Filled 2021-03-05: qty 2
  Filled 2021-03-05 (×2): qty 1

## 2021-03-05 MED ORDER — CARVEDILOL 12.5 MG PO TABS: 12.5000 mg | ORAL_TABLET | Freq: Two times a day (BID) | ORAL | Status: DC

## 2021-03-05 MED ORDER — ASPIRIN EC 81 MG PO TBEC: 81.0000 mg | DELAYED_RELEASE_TABLET | Freq: Every day | ORAL | Status: DC

## 2021-03-05 MED ORDER — FUROSEMIDE 10 MG/ML IJ SOLN
120.0000 mg | Freq: Two times a day (BID) | INTRAVENOUS | Status: DC
  Administered 2021-03-05: 120 mg via INTRAVENOUS
  Filled 2021-03-05: qty 10
  Filled 2021-03-05: qty 12

## 2021-03-05 MED ORDER — URSODIOL 500 MG PO TABS: 500.0000 mg | ORAL_TABLET | Freq: Three times a day (TID) | ORAL | Status: DC

## 2021-03-05 MED ORDER — ALBUTEROL SULFATE (2.5 MG/3ML) 0.083% IN NEBU: 2.5000 mg | INHALATION_SOLUTION | Freq: Four times a day (QID) | RESPIRATORY_TRACT | Status: DC | PRN

## 2021-03-05 MED ORDER — HEPARIN BOLUS VIA INFUSION
4000.0000 [IU] | Freq: Once | INTRAVENOUS | Status: AC
  Administered 2021-03-05: 4000 [IU] via INTRAVENOUS
  Filled 2021-03-05: qty 4000

## 2021-03-05 MED ORDER — ACETAMINOPHEN 500 MG PO TABS
1000.0000 mg | ORAL_TABLET | Freq: Four times a day (QID) | ORAL | Status: DC | PRN
  Administered 2021-03-09: 1000 mg via ORAL
  Filled 2021-03-05: qty 2

## 2021-03-05 MED ORDER — SODIUM CHLORIDE 0.9% FLUSH: 3.0000 mL | INTRAVENOUS | Status: DC | PRN

## 2021-03-05 NOTE — ED Triage Notes (Signed)
Patient complains of chest pain, SOB with cold and cough symptoms since Friday. Reports that she took nitro last night and Sunday night with some relief. Patient denies fever. Pain minimum on arrival.

## 2021-03-05 NOTE — ED Notes (Signed)
MD Trifan notified of trop 57

## 2021-03-05 NOTE — ED Notes (Addendum)
Cards team at [facility].

## 2021-03-05 NOTE — Consult Note (Addendum)
Advanced Heart Failure Team Consult Note   Primary Physician: [name], MD PCP-Cardiologist:  Glori Bickers, MD  Reason for Consultation: Chest Pain w/ Elevated Hs Trop   HPI:    Susan [name] is seen today for evaluation of Chest Pain w/ elevated HS Trop at the request of Dr. Zenia Resides, Emergency Medicine.   Susan [name] is a 70 y.o. female with a history of RBBB, DM, Hypothyroidish, diastolic heart failure, CAD MI 2000/2007 BMS 2007, TIA, obesity, CKD Stage IV and  Rt transmetatarsal amputation.   Multiple admits for a/c diastolic CHF in the setting of poor med compliance.   Has stage IV CKD, w/ baseline SCr ~3.5. She is followed by nephrology.    She had RHC 07/24/17 that showed mild PAH with evidence of RV strain likely due to OHS/OSA.   Last Echo 9/21 w/ normal EF 55-60%. RV normal.   Had home sleep study 07/25/20 and this was negative for OSA.     She presents to ED w/ CC of chest pain. Hs trop mildly elevated 247>>236 but in the setting of AKI on Stage IV CKD. SCr elevated at 4.01, up from last value of 3.54 c/w her usual baseline. BUN also elevated  At 109. EKG shows NSR 80 bpm w/ chronic RBBB, inferior Qwaves  She report CP started 4 days ago. Intermittent, substernal + mid scapular. Not particularly worse w/ exertion. Associated w/ dry cough and worse at night when she lays down to sleep. Also notes increased resting and exertional dyspnea. Reports full med compliance but has noticed decreased UOP despite taking daily torsemide. She initially thought she had a cold given her cough and went to PCP today and was subsequently referred to the ED. She cannot recall her symptoms when she had her MI previously. Unsure if this is similar.    CXR shows mild pulmonary interstitial edema w/ trace pleural effusions. BP elevated 341D systolic.    Echo 02/08/2020  1. Left ventricular ejection fraction, by estimation, is 55 to 60%. The left ventricle has normal function. The left  ventricle has no regional wall motion abnormalities. There is moderate left ventricular hypertrophy. Left ventricular diastolic parameters are indeterminate. 2. Right ventricular systolic function is normal. The right ventricular size is normal. 3. The mitral valve is normal in structure. No evidence of mitral valve regurgitation. No evidence of mitral stenosis. 4. The aortic valve is tricuspid. Aortic valve regurgitation is not visualized. No aortic stenosis is present. 5. The inferior vena cava is normal in size with greater than 50% respiratory variability, suggesting right atrial pressure of 3 mmHg.  Review of Systems: [y] = yes, [ ]  = no   General: Weight gain [ ] ; Weight loss [ ] ; Anorexia [ ] ; Fatigue [ ] ; Fever [ ] ; Chills [ ] ; Weakness [ ]   Cardiac: Chest pain/pressure [Y ]; Resting SOB [ ] ; Exertional SOB [ Y]; Orthopnea [ Y]; Pedal Edema [ ] ; Palpitations [ ] ; Syncope [ ] ; Presyncope [ ] ; Paroxysmal nocturnal dyspnea[ ]   Pulmonary: Cough [ Y]; Wheezing[ ] ; Hemoptysis[ ] ; Sputum [ ] ; Snoring [ ]   GI: Vomiting[ ] ; Dysphagia[ ] ; Melena[ ] ; Hematochezia [ ] ; Heartburn[ ] ; Abdominal pain [ ] ; Constipation [ ] ; Diarrhea [ ] ; BRBPR [ ]   GU: Hematuria[ ] ; Dysuria [ ] ; Nocturia[ ]   Vascular: Pain in legs with walking [ ] ; Pain in feet with lying flat [ ] ; Non-healing sores [ ] ; Stroke [ ] ; TIA [ ] ; Slurred speech [ ] ;  Neuro: Headaches[ ] ; Vertigo[ ] ; Seizures[ ] ; Paresthesias[ ] ;Blurred vision [ ] ; Diplopia [ ] ; Vision changes [ ]   Ortho/Skin: Arthritis [ ] ; Joint pain [ ] ; Muscle pain [ ] ; Joint swelling [ ] ; Back Pain [ ] ; Rash [ ]   Psych: Depression[ ] ; Anxiety[ ]   Heme: Bleeding problems [ ] ; Clotting disorders [ ] ; Anemia [ ]   Endocrine: Diabetes [ ] ; Thyroid dysfunction[ ]   Home Medications Prior to Admission medications   Medication Sig Start Date End Date Taking? Authorizing Provider  acetaminophen (TYLENOL) 500 MG tablet Take 1,000 mg by mouth every 6 (six) hours as needed for  moderate pain or headache.    [provider]  albuterol (VENTOLIN HFA) 108 (90 Base) MCG/ACT inhaler TAKE 2 PUFFS BY MOUTH EVERY 6 HOURS AS NEEDED FOR WHEEZE OR SHORTNESS OF BREATH 06/27/19   Hancock, [name], MD  allopurinol (ZYLOPRIM) 100 MG tablet TAKE 1 TABLET BY MOUTH TWICE A DAY 08/02/19   Newt Minion, MD  Ascorbic Acid (VITAMIN C) 1000 MG tablet Take 1,000 mg by mouth daily.    [provider]  aspirin EC 81 MG tablet Take 1 tablet (81 mg total) by mouth daily with breakfast. 05/28/20   Emokpae, Courage, MD  carvedilol (COREG) 12.5 MG tablet TAKE 1 TABLET (12.5 MG TOTAL) BY MOUTH 2 (TWO) TIMES DAILY WITH A MEAL. 08/13/20   [name], MD  cephALEXin (KEFLEX) 250 MG capsule TAKE 1 CAPSULE BY MOUTH DAILY. 03/04/21   [name], MD  cholecalciferol (VITAMIN D) 1000 units tablet Take 1,000 Units by mouth daily with supper.     [provider]  clopidogrel (PLAVIX) 75 MG tablet TAKE 1 TABLET BY MOUTH DAILY WITH BREAKFAST. 08/15/20   Clegg, Amy D, NP  ferrous sulfate 325 (65 FE) MG tablet Take 1 tablet (325 mg total) by mouth 2 (two) times daily with a meal. Reported on 08/21/2015 06/07/18   Alycia Rossetti, MD  FLUoxetine (PROZAC) 20 MG capsule TAKE 1 CAPSULE BY MOUTH EVERY DAY 09/21/20   [name], MD  guaiFENesin-dextromethorphan (ROBITUSSIN DM) 100-10 MG/5ML syrup Take 10 mLs by mouth every 4 (four) hours as needed for cough. Patient not taking: Reported on 02/04/2021 03/13/20   [place] Donnamarie Poag, DO  Insulin Disposable Pump (OMNIPOD DASH 5 PACK PODS) MISC Inject into the skin See admin instructions. Use continuously with Novolin R - change every 72 hours. 11/18/18   [provider]  insulin NPH Human (HUMULIN N,NOVOLIN N) 100 UNIT/ML injection Inject 15 Units into the skin See admin instructions. Inject 15 units subcutaneously in the morning if CBG >300;    [provider]  insulin regular (NOVOLIN R) 100 units/mL injection Inject into  the skin See admin instructions. Manually add bolus to continuous dose via OmniPod 3 times daily per sliding scale (CBG 80-150 7 units, 151-200 9 units, 201-250 12 units, 251-300 14 units, 301- 400 17 units)    [provider]  levothyroxine (SYNTHROID, LEVOTHROID) 50 MCG tablet Take 1 tablet (50 mcg total) by mouth daily before breakfast. 11/07/16   Dena Billet B, PA-C  magnesium oxide (MAG-OX) 400 MG tablet TAKE 1 TABLET BY MOUTH EVERY DAY 07/23/20   Alycia Rossetti, MD  Multiple Vitamins-Minerals (AIRBORNE GUMMIES PO) Take 2 tablets by mouth every evening.    [provider]  Multiple Vitamins-Minerals (CENTRUM SILVER 50+WOMEN) TABS Take 1 tablet by mouth every evening.    [provider]  Multiple Vitamins-Minerals (ZINC PO)  Take 1 tablet by mouth every evening.    [provider]  nitroGLYCERIN (NITROSTAT) 0.4 MG SL tablet PLACE 1 TABLET (0.4 MG TOTAL) UNDER THE TONGUE EVERY 5 (FIVE) MINUTES AS NEEDED FOR CHEST PAIN. 02/06/21   Larey Dresser, MD  [facility] VERIO test strip 3 (three) times daily. 07/25/20   [provider]  polyethylene glycol (MIRALAX / GLYCOLAX) 17 g packet Take 17 g by mouth daily. 08/19/20   Lavina Hamman, MD  pregabalin (LYRICA) 150 MG capsule Take 1 capsule (150 mg total) by mouth 2 (two) times daily. 11/12/16   Alycia Rossetti, MD  torsemide (DEMADEX) 20 MG tablet Take 100 mg by mouth 2 (two) times daily.    [provider]  traZODone (DESYREL) 100 MG tablet Take 100 mg by mouth as needed for sleep. Patient takes 2 tablets by mouth as needed.    [provider]  ursodiol (ACTIGALL) 500 MG tablet Take 500 mg by mouth 3 (three) times daily. 12/11/20   [provider]    Past Medical History: Past Medical History:  Diagnosis Date   Acute MI ([place]) 1999; 2007   Anemia    hx   Anginal pain ([place])    Anxiety    ARF (acute renal failure) ([place]) 06/2017   [facility]   Arthritis     "generalized" (03/15/2014)   CAD (coronary artery disease)    MI in 2000 - MI  2007 - treated bare metal stent (no nuclear since then as 9/11)   Carotid artery disease (HCC)    CHF (congestive heart failure) ([place])    Chronic diastolic heart failure ([place])    a) ECHO (08/2013) EF 55-60% and RV function nl b) RHC (08/2013) RA 4, RV 30/5/7, PA 25/10 (16), PCWP 7, Fick CO/CI 6.3/2.7, PVR 1.5 WU, PA 61 and 66%   Daily headache    "~ every other day; since I fell in June" (03/15/2014)   Depression    Dyslipidemia    Dyspnea    Exertional shortness of breath    HTN (hypertension)    Hypothyroidism    Neuropathy    Obesity    Osteoarthritis    Peripheral neuropathy    PONV (postoperative nausea and vomiting)    RBBB (right bundle branch block)    Old   Stroke ([place])    mini strokes   Syncope    likely due to low blood sugar   Tachycardia    Sinus tachycardia   Type II diabetes mellitus ([place])    Type II   Urinary incontinence    Venous insufficiency     Past Surgical History: Past Surgical History:  Procedure Laterality Date   ABDOMINAL HYSTERECTOMY  1980's   AMPUTATION Right 02/24/2018   Procedure: RIGHT FOOT GREAT TOE AND 2ND TOE AMPUTATION;  Surgeon: Newt Minion, MD;  Location: [place];  Service: Orthopedics;  Laterality: Right;   AMPUTATION Right 04/30/2018   Procedure: RIGHT TRANSMETATARSAL AMPUTATION;  Surgeon: Newt Minion, MD;  Location: [place];  Service: Orthopedics;  Laterality: Right;   BIOPSY  05/27/2020   Procedure: BIOPSY;  Surgeon: [name], DO;  [facility];  Service: Endoscopy;;   CATARACT EXTRACTION, BILATERAL Bilateral ?2013   COLONOSCOPY W/ POLYPECTOMY     COLONOSCOPY WITH PROPOFOL N/A 03/13/2019   Procedure: COLONOSCOPY WITH PROPOFOL;  Surgeon: Jerene Bears, MD;  Location: [facility] [place];  Service: Gastroenterology;  Laterality: N/A;   CORONARY ANGIOPLASTY WITH  [place]; 2007   "1 + 1"   ESOPHAGOGASTRODUODENOSCOPY (EGD) WITH  PROPOFOL N/A 03/13/2019   Procedure: ESOPHAGOGASTRODUODENOSCOPY (EGD) WITH PROPOFOL;  Surgeon: Jerene Bears, MD;  Location: [facility] ENDOSCOPY;  Service: Gastroenterology;  Laterality: N/A;   ESOPHAGOGASTRODUODENOSCOPY (EGD) WITH PROPOFOL N/A 05/27/2020   Procedure: ESOPHAGOGASTRODUODENOSCOPY (EGD) WITH PROPOFOL;  Surgeon: [name], DO;  [facility];  Service: Endoscopy;  Laterality: N/A;   EYE SURGERY Bilateral    lazer   HEMOSTASIS CLIP PLACEMENT  03/13/2019   Procedure: HEMOSTASIS CLIP PLACEMENT;  Surgeon: Jerene Bears, MD;  Location: [facility] ENDOSCOPY;  Service: Gastroenterology;;   KNEE ARTHROSCOPY Left 10/25/2006   POLYPECTOMY  03/13/2019   Procedure: POLYPECTOMY;  Surgeon: Jerene Bears, MD;  Location: [place];  Service: Gastroenterology;;   RIGHT HEART CATH N/A 07/24/2017   Procedure: RIGHT HEART CATH;  Surgeon: Jolaine Artist, MD;  Location: [facility] CV LAB;  Service: Cardiovascular;  Laterality: N/A;   RIGHT HEART CATHETERIZATION N/A 09/22/2013   Procedure: RIGHT HEART CATH;  Surgeon: Jolaine Artist, MD;  Location: [facility] CATH LAB;  Service: Cardiovascular;  Laterality: N/A;   SHOULDER ARTHROSCOPY WITH OPEN ROTATOR CUFF REPAIR Right 03/14/2014   Procedure: RIGHT SHOULDER ARTHROSCOPY WITH BICEPS RELEASE, OPEN SUBSCAPULA REPAIR, OPEN SUPRASPINATUS REPAIR.;  Surgeon: Meredith Pel, MD;  Location: [facility];  Service: Orthopedics;  Laterality: Right;   TOE AMPUTATION Right 02/24/2018   GREAT TOE AND 2ND TOE AMPUTATION   TUBAL LIGATION  1970's    Family History: Family History  Problem Relation Age of Onset   Heart attack Mother 55    Social History: Social History   Socioeconomic History   Marital status: Married    Spouse name: Not on file   Number of children: 3   Years of education: 12th   Highest education level: Not on file  Occupational History    Employer: UNEMPLOYED  Tobacco Use   Smoking status: Former    Packs/day: 3.00    Years: 32.00     Pack years: 96.00    Types: Cigarettes    Quit date: 10/24/1997    Years since quitting: 23.3   Smokeless tobacco: Never  Vaping Use   Vaping Use: Never used  Substance and Sexual Activity   Alcohol use: Not Currently    Comment: "might have 2-3 daiquiris in the summer"   Drug use: No   Sexual activity: Not Currently  Other Topics Concern   Not on file  Social History Narrative   Pt lives at home with her spouse.   Caffeine Use- 3 sodas daily   Social Determinants of Health   Financial Resource Strain: Low Risk    Difficulty of Paying Living Expenses: Not very hard  Food Insecurity: No Food Insecurity   Worried About Charity fundraiser in the Last Year: Never true   Ran Out of Food in the Last Year: Never true  Transportation Needs: No Transportation Needs   Lack of Transportation (Medical): No   Lack of Transportation (Non-Medical): No  Physical Activity: Not on file  Stress: Not on file  Social Connections: Not on file    Allergies:  Allergies  Allergen Reactions   Drug Ingredient [Cephalexin] Diarrhea    Pt reports heavy diarrhea with use of keflex   Codeine Nausea And Vomiting    Objective:    Vital Signs:   Temp:  [98 F (36.7 C)] 98 F (36.7 C) (10/11 1215) Pulse Rate:  [  77-84] 78 (10/11 1602) Resp:  [12-18] 18 (10/11 1602) BP: (136-158)/(66-76) 158/76 (10/11 1602) SpO2:  [93 %-98 %] 97 % (10/11 1602) Weight:  [124.7 kg] 124.7 kg (10/11 1215)    Weight change: Filed Weights   03/05/21 1215  Weight: 124.7 kg    Intake/Output:  No intake or output data in the 24 hours ending 03/05/21 1627    Physical Exam    General:  Well appearing, obese. No resp difficulty HEENT: normal, thick neck, JVP not well visualized . Carotids 2+ bilat; no bruits. No lymphadenopathy or thyromegaly appreciated. Cor: PMI nondisplaced. Regular rate & rhythm. No rubs, gallops or murmurs. Lungs: decreased BS bilaterally, + rt basilar crackles  Abdomen: obese, soft,  nontender, nondistended. No hepatosplenomegaly. No bruits or masses. Good bowel sounds. Extremities: no cyanosis, clubbing, rash, obese LEs, trace- 1+ bilateral [name], s/p rt TMT amputation  Neuro: alert & orientedx3, cranial nerves grossly intact. moves all 4 extremities w/o difficulty. Affect pleasant   Telemetry   NSR 70s  EKG    NSR 80 bpm. , chronic RBBB, new inferior [employer] Metabolic Panel: Recent Labs  Lab 03/05/21 1236  NA 131*  K 3.7  CL 93*  CO2 25  GLUCOSE 339*  BUN 109*  CREATININE 4.01*  CALCIUM 8.9    Liver Function Tests: No results for input(s): AST, ALT, ALKPHOS, BILITOT, PROT, ALBUMIN in the last 168 hours. No results for input(s): LIPASE, AMYLASE in the last 168 hours. No results for input(s): AMMONIA in the last 168 hours.  CBC: Recent Labs  Lab 03/05/21 1236  WBC 9.9  HGB 11.4*  HCT 36.2  MCV 85.8  PLT 159    Cardiac Enzymes: No results for input(s): CKTOTAL, CKMB, CKMBINDEX, TROPONINI in the last 168 hours.  BNP: BNP (last 3 results) Recent Labs    08/21/20 2307 11/08/20 1433 03/05/21 1236  BNP 215.9* 443.2* 584.6*    ProBNP (last 3 results) No results for input(s): PROBNP in the last 8760 hours.   CBG: No results for input(s): GLUCAP in the last 168 hours.  Coagulation Studies: Recent Labs    03/05/21 1545  LABPROT 12.1  INR 0.9     Imaging   DG Chest 2 View  Result Date: 03/05/2021 CLINICAL DATA:  Cough, shortness of breath EXAM: CHEST - 2 VIEW COMPARISON:  Chest radiograph 08/24/2020 FINDINGS: The cardiomediastinal silhouette is within normal limits. There are increased interstitial markings throughout both lungs. There are trace bilateral pleural effusions. There is no pneumothorax. There is no acute osseous abnormality.  Sh IMPRESSION: Suspect mild pulmonary interstitial edema with trace pleural effusions. Electronically Signed   By: Valetta Mole M.D.   On: 03/05/2021 13:17     Medications:      Current Medications:  furosemide  20 mg Intravenous Once    Infusions:  heparin 1,050 Units/hr (03/05/21 1605)     Assessment/Plan   Chest Pain w/ Elevated HS Trop/ CAD  - known CAD w/ h/o remote MI in 2000 and 2007 w/ BMS to RCA - mixed typical and atypical symptoms  - HS trop mildly elevated w/ flat trend, 247>>236 but in the setting of AKI on Stage IV CKD. SCr elevated at 4.01, up from last value of 3.54 c/w her usual baseline. EKG dose however show inferior Qwaves. Also w/ evidence of fluid overload. CXR w/ mild pulmonary edema - suspect CP 2/2 fluid overload and trop elevation demand ischemia in the setting of CHF and  CKD  - Would avoid cath given baseline CKD and risk for worsening renal failure - Continue IV heparin overnight - Continue to cycle trops - Diurese w/ IV Lasix 120 mg bid  - Update 2D Echo , look for RWMAs - ASA 81 + add statin  - Continue ? blocker - start nitro gtt    2. Acute on Chronic Diastolic CHF - Last echo 2/59 EF 55-60%, RV normal  - CXR w/ mild pulmonary vascular congestion  - increased dyspnea, orthopnea and dry nocturnal cough x 4 days  - Diurese w/ IV Lasix 120 mg bid, follow response - Repeat Echo   3. AKI on Stage IV CKD - Baseline sCr ~3.5 - Elevated at 4.0 today, BUN 109  - followed by outpatient nephrology  - follow BMP   4. Type 2DM - poorly controlled, last hgb A1c 10.3 - management per primary team    Given other medical problems including AKI on Stage IV CKD and poorly controlled DM, recommend admission to medicine service.   Length of Stay: 0  Lyda Jester, PA-C  03/05/2021, 4:27 PM  Advanced Heart Failure Team Pager [phone] (M-F; 7a - 5p)  Please contact [facility] for night-coverage after hours (4p -7a ) and weekends on [email or web address]  Patient seen with PA, and agree with the above note.   She reports both chest pain and dyspnea since last Friday. The chest pain has waxed and waned, NTG has helped.  The  dyspnea has been continuous.  She has not had chest pain like this for many years.  She still has mild chest pain today.  HS-TnI mildly elevated with no trend 247 => 236.  ECG shows new inferior and anterolateral Qs with old RBBB.  BUN/creatinine 109/4.01.   CXR with pulmonary edema.   General: NAD Neck: JVP 12-14 cm, no thyromegaly or thyroid nodule.  Lungs: Clear to auscultation bilaterally with normal respiratory effort. CV: Nondisplaced PMI.  Heart regular S1/S2, no S3/S4, no murmur.  1+ edema to knees.  No carotid bruit.  Normal pedal pulses.  Abdomen: Soft, nontender, no hepatosplenomegaly, no distention.  Skin: Intact without lesions or rashes.  Neurologic: Alert and oriented x 3.  Psych: Normal affect. Extremities: No clubbing or cyanosis.  HEENT: Normal.   The lack of troponin trend suggests that she is not having acute event though it is possible she had an event over the weekend.  She does have inferior and anterolateral Qs that were not present 6 months ago.   Alternatively, the elevated troponin may be demand ischemia from volume overload.  - ASA, Plavix, statin, heparin gtt.  - Will send additional HS-TnI check to see if there is any trend developing.  - Needs echo to assess for wall motion abnormalities (EF has been normal in the past).   - With CKD IV and BUN/creatinine above baseline, would not cath unless she is having active MI (troponin trend suggests that if she had event, it was several days ago).  - NTG gtt for residual chest pain and elevated BP.   She is volume overloaded on exam, acute on chronic diastolic CHF.  This is complicated by AKI on CKD stage IV.  - Lasix 120 mg IV bid for now (take torsemide 100 mg bid at home).  - Concerned that she is going to end up on dialysis relatively soon for volume management.   Loralie Champagne 03/05/2021 5:22 PM

## 2021-03-05 NOTE — Progress Notes (Signed)
ANTICOAGULATION CONSULT NOTE - Initial Consult  Pharmacy Consult for heparin dosing Indication: chest pain/ACS/NSTEMI  Allergies  Allergen Reactions   Drug Ingredient [Cephalexin] Diarrhea    Pt reports heavy diarrhea with use of keflex   Codeine Nausea And Vomiting    Patient Measurements:   Heparin Dosing Weight: 87.3 kg  Vital Signs: Temp: 98 F (36.7 C) (10/11 1215) Temp Source: Oral (10/11 1215) BP: 138/66 (10/11 1345) Pulse Rate: 84 (10/11 1445)  Labs: Recent Labs    03/05/21 1236  HGB 11.4*  HCT 36.2  PLT 159  CREATININE 4.01*  TROPONINIHS 247*    Estimated Creatinine Clearance: 17.1 mL/min (A) (by C-G formula based on SCr of 4.01 mg/dL (H)).   Medical History: Past Medical History:  Diagnosis Date   Acute MI ([place]) 1999; 2007   Anemia    hx   Anginal pain ([place])    Anxiety    ARF (acute renal failure) ([place]) 06/2017   [facility]   Arthritis    "generalized" (03/15/2014)   CAD (coronary artery disease)    MI in 2000 - MI  2007 - treated bare metal stent (no nuclear since then as 9/11)   Carotid artery disease ([facility])    CHF (congestive heart failure) ([place])    Chronic diastolic heart failure ([place])    a) ECHO (08/2013) EF 55-60% and RV function nl b) RHC (08/2013) RA 4, RV 30/5/7, PA 25/10 (16), PCWP 7, Fick CO/CI 6.3/2.7, PVR 1.5 WU, PA 61 and 66%   Daily headache    "~ every other day; since I fell in June" (03/15/2014)   Depression    Dyslipidemia    Dyspnea    Exertional shortness of breath    HTN (hypertension)    Hypothyroidism    Neuropathy    Obesity    Osteoarthritis    Peripheral neuropathy    PONV (postoperative nausea and vomiting)    RBBB (right bundle branch block)    Old   Stroke ([place])    mini strokes   Syncope    likely due to low blood sugar   Tachycardia    Sinus tachycardia   Type II diabetes mellitus (HCC)    Type II   Urinary incontinence    Venous insufficiency     Medications:  See  MAR  Assessment: Patient presents from primary care appointment with shortness of breath and chest pain (s/p nitroglycerin at home) with EKG changes. No PTA anticoagulation per outside records. No signs of bleeding noted. SCr 4.01 (BL ~3.6), CBC okay with Hgb 11.4, HCT 36.2, and PLTs 159.   Goal of Therapy:  Heparin level 0.3-0.7 units/ml Monitor platelets by anticoagulation protocol: Yes   Plan:  Give 4,000 units bolus x 1 Start heparin infusion at 1,050 units/hr Check anti-Xa level in 8 hours and daily while on heparin Continue to monitor H&H and platelets  Donald Pore 03/05/2021,3:29 PM

## 2021-03-05 NOTE — ED Notes (Signed)
Pt alert, NAD, calm, interactive, resps e/u, speakingin clear complete phrases, denies pain, admits to sob and nausea.

## 2021-03-05 NOTE — Progress Notes (Signed)
Subjective:    Patient ID: Susan [name], female    DOB: [birth year], 70 y.o.   MRN: [id number]  HPI Patient states her symptoms began Friday night with a dry cough.  From that time she started developing increasing shortness of breath.  She also has had some intermittent chest pain in the center of her chest.  This occurred Monday night and Sunday night.  It lasted few minutes.  It was bad enough to cause her to use her nitroglycerin last night.  Chest pain is occurring at rest without provocation.  Cough is dry and nonproductive.  She denies any hemoptysis.  She denies any pleurisy.  She denies any mucus.  She has had some runny nose.  On exam today she has +1 pitting edema in both legs up to her knees.  Her weight is up 8 pounds from the last time I saw her 2 months ago.  She states that she has been compliant with her diuretic and taking 80 mg twice daily however she appears fluid overloaded today on exam.  Her lungs however are clear to auscultation bilaterally.  EKG today shows Q waves in leads II, III and aVF as well as V4 V5 and V6.  This is all concerning for an inferior apical infarct.  These EKG changes are new compared to her EKG from March.  Past Medical History:  Diagnosis Date   Acute MI ([place]) 1999; 2007   Anemia    hx   Anginal pain ([place])    Anxiety    ARF (acute renal failure) ([place]) 06/2017   [facility]   Arthritis    "generalized" (03/15/2014)   CAD (coronary artery disease)    MI in 2000 - MI  2007 - treated bare metal stent (no nuclear since then as 9/11)   Carotid artery disease ([facility])    CHF (congestive heart failure) ([place])    Chronic diastolic heart failure ([place])    a) ECHO (08/2013) EF 55-60% and RV function nl b) RHC (08/2013) RA 4, RV 30/5/7, PA 25/10 (16), PCWP 7, Fick CO/CI 6.3/2.7, PVR 1.5 WU, PA 61 and 66%   Daily headache    "~ every other day; since I fell in June" (03/15/2014)   Depression    Dyslipidemia    Dyspnea    Exertional shortness of  breath    HTN (hypertension)    Hypothyroidism    Neuropathy    Obesity    Osteoarthritis    Peripheral neuropathy    PONV (postoperative nausea and vomiting)    RBBB (right bundle branch block)    Old   Stroke ([place])    mini strokes   Syncope    likely due to low blood sugar   Tachycardia    Sinus tachycardia   Type II diabetes mellitus ([employer])    Type II   Urinary incontinence    Venous insufficiency    Past Surgical History:  Procedure Laterality Date   ABDOMINAL HYSTERECTOMY  1980's   AMPUTATION Right 02/24/2018   Procedure: RIGHT FOOT GREAT TOE AND 2ND TOE AMPUTATION;  Surgeon: Newt Minion, MD;  Location: [facility];  Service: Orthopedics;  Laterality: Right;   AMPUTATION Right 04/30/2018   Procedure: RIGHT TRANSMETATARSAL AMPUTATION;  Surgeon: Newt Minion, MD;  Location: [place];  Service: Orthopedics;  Laterality: Right;   BIOPSY  05/27/2020   Procedure: BIOPSY;  Surgeon: [name], DO;  [facility];  Service: Endoscopy;;   CATARACT  EXTRACTION, BILATERAL Bilateral ?2013   COLONOSCOPY W/ POLYPECTOMY     COLONOSCOPY WITH PROPOFOL N/A 03/13/2019   Procedure: COLONOSCOPY WITH PROPOFOL;  Surgeon: Jerene Bears, MD;  Location: [facility];  Service: Gastroenterology;  Laterality: N/A;   [place]; 2007   "1 + 1"   ESOPHAGOGASTRODUODENOSCOPY (EGD) WITH PROPOFOL N/A 03/13/2019   Procedure: ESOPHAGOGASTRODUODENOSCOPY (EGD) WITH PROPOFOL;  Surgeon: Jerene Bears, MD;  Location: [place];  Service: Gastroenterology;  Laterality: N/A;   ESOPHAGOGASTRODUODENOSCOPY (EGD) WITH PROPOFOL N/A 05/27/2020   Procedure: ESOPHAGOGASTRODUODENOSCOPY (EGD) WITH PROPOFOL;  Surgeon: [name], DO;  [facility];  Service: Endoscopy;  Laterality: N/A;   EYE SURGERY Bilateral    lazer   HEMOSTASIS CLIP PLACEMENT  03/13/2019   Procedure: HEMOSTASIS CLIP PLACEMENT;  Surgeon: Jerene Bears, MD;  [facility];  Service:  Gastroenterology;;   KNEE ARTHROSCOPY Left 10/25/2006   POLYPECTOMY  03/13/2019   Procedure: POLYPECTOMY;  Surgeon: Jerene Bears, MD;  Location: [place];  Service: Gastroenterology;;   RIGHT HEART CATH N/A 07/24/2017   Procedure: RIGHT HEART CATH;  Surgeon: Jolaine Artist, MD;  Location: [place] [facility];  Service: Cardiovascular;  Laterality: N/A;   RIGHT HEART CATHETERIZATION N/A 09/22/2013   Procedure: RIGHT HEART CATH;  Surgeon: Jolaine Artist, MD;  Location: [facility] CATH LAB;  Service: Cardiovascular;  Laterality: N/A;   SHOULDER ARTHROSCOPY WITH OPEN ROTATOR CUFF REPAIR Right 03/14/2014   Procedure: RIGHT SHOULDER ARTHROSCOPY WITH BICEPS RELEASE, OPEN SUBSCAPULA REPAIR, OPEN SUPRASPINATUS REPAIR.;  Surgeon: Meredith Pel, MD;  Location: [place];  Service: Orthopedics;  Laterality: Right;   TOE AMPUTATION Right 02/24/2018   GREAT TOE AND 2ND TOE AMPUTATION   TUBAL LIGATION  1970's   Current Outpatient Medications on File Prior to Visit  Medication Sig Dispense Refill   acetaminophen (TYLENOL) 500 MG tablet Take 1,000 mg by mouth every 6 (six) hours as needed for moderate pain or headache.     albuterol (VENTOLIN HFA) 108 (90 Base) MCG/ACT inhaler TAKE 2 PUFFS BY MOUTH EVERY 6 HOURS AS NEEDED FOR WHEEZE OR SHORTNESS OF BREATH 8.5 g 1   allopurinol (ZYLOPRIM) 100 MG tablet TAKE 1 TABLET BY MOUTH TWICE A DAY 180 tablet 4   Ascorbic Acid (VITAMIN C) 1000 MG tablet Take 1,000 mg by mouth daily.     aspirin EC 81 MG tablet Take 1 tablet (81 mg total) by mouth daily with breakfast. 30 tablet 11   carvedilol (COREG) 12.5 MG tablet TAKE 1 TABLET (12.5 MG TOTAL) BY MOUTH 2 (TWO) TIMES DAILY WITH A MEAL. 180 tablet 1   cephALEXin (KEFLEX) 250 MG capsule TAKE 1 CAPSULE BY MOUTH DAILY. 30 capsule 2   cholecalciferol (VITAMIN D) 1000 units tablet Take 1,000 Units by mouth daily with supper.      clopidogrel (PLAVIX) 75 MG tablet TAKE 1 TABLET BY MOUTH DAILY WITH BREAKFAST. 90 tablet 3    ferrous sulfate 325 (65 FE) MG tablet Take 1 tablet (325 mg total) by mouth 2 (two) times daily with a meal. Reported on 08/21/2015 60 tablet 2   FLUoxetine (PROZAC) 20 MG capsule TAKE 1 CAPSULE BY MOUTH EVERY DAY 90 capsule 2   guaiFENesin-dextromethorphan (ROBITUSSIN DM) 100-10 MG/5ML syrup Take 10 mLs by mouth every 4 (four) hours as needed for cough. (Patient not taking: Reported on 02/04/2021) 118 mL 0   Insulin Disposable Pump (OMNIPOD DASH 5 PACK PODS) MISC Inject into the  skin See admin instructions. Use continuously with Novolin R - change every 72 hours.     insulin NPH Human (HUMULIN N,NOVOLIN N) 100 UNIT/ML injection Inject 15 Units into the skin See admin instructions. Inject 15 units subcutaneously in the morning if CBG >300;     insulin regular (NOVOLIN R) 100 units/mL injection Inject into the skin See admin instructions. Manually add bolus to continuous dose via OmniPod 3 times daily per sliding scale (CBG 80-150 7 units, 151-200 9 units, 201-250 12 units, 251-300 14 units, 301- 400 17 units)     levothyroxine (SYNTHROID, LEVOTHROID) 50 MCG tablet Take 1 tablet (50 mcg total) by mouth daily before breakfast. 90 tablet 1   magnesium oxide (MAG-OX) 400 MG tablet TAKE 1 TABLET BY MOUTH EVERY DAY 120 tablet 2   Multiple Vitamins-Minerals (AIRBORNE GUMMIES PO) Take 2 tablets by mouth every evening.     Multiple Vitamins-Minerals (CENTRUM SILVER 50+WOMEN) TABS Take 1 tablet by mouth every evening.     Multiple Vitamins-Minerals (ZINC PO) Take 1 tablet by mouth every evening.     nitroGLYCERIN (NITROSTAT) 0.4 MG SL tablet PLACE 1 TABLET (0.4 MG TOTAL) UNDER THE TONGUE EVERY 5 (FIVE) MINUTES AS NEEDED FOR CHEST PAIN. 25 tablet 1   ONETOUCH VERIO test strip 3 (three) times daily.     polyethylene glycol (MIRALAX / GLYCOLAX) 17 g packet Take 17 g by mouth daily. 14 each 0   pregabalin (LYRICA) 150 MG capsule Take 1 capsule (150 mg total) by mouth 2 (two) times daily. 60 capsule 2   torsemide  (DEMADEX) 20 MG tablet Take 100 mg by mouth 2 (two) times daily.     traZODone (DESYREL) 100 MG tablet Take 100 mg by mouth as needed for sleep. Patient takes 2 tablets by mouth as needed.     ursodiol (ACTIGALL) 500 MG tablet Take 500 mg by mouth 3 (three) times daily.     No current facility-administered medications on file prior to visit.   Allergies  Allergen Reactions   Drug Ingredient [Cephalexin] Diarrhea    Pt reports heavy diarrhea with use of keflex   Codeine Nausea And Vomiting   Social History   Socioeconomic History   Marital status: Married    Spouse name: Not on file   Number of children: 3   Years of education: 12th   Highest education level: Not on file  Occupational History    Employer: UNEMPLOYED  Tobacco Use   Smoking status: Former    Packs/day: 3.00    Years: 32.00    Pack years: 96.00    Types: Cigarettes    Quit date: 10/24/1997    Years since quitting: 23.3   Smokeless tobacco: Never  Vaping Use   Vaping Use: Never used  Substance and Sexual Activity   Alcohol use: Not Currently    Comment: "might have 2-3 daiquiris in the summer"   Drug use: No   Sexual activity: Not Currently  Other Topics Concern   Not on file  Social History Narrative   Pt lives at home with her spouse.   Caffeine Use- 3 sodas daily   Social Determinants of Health   Financial Resource Strain: Low Risk    Difficulty of Paying Living Expenses: Not very hard  Food Insecurity: No Food Insecurity   Worried About Running Out of Food in the Last Year: Never true   Ran Out of Food in the Last Year: Never true  Transportation Needs: No Transportation Needs  Lack of Transportation (Medical): No   Lack of Transportation (Non-Medical): No  Physical Activity: Not on file  Stress: Not on file  Social Connections: Not on file  Intimate Partner Violence: Not on file     Review of Systems     Objective:   Physical Exam Constitutional:      General: She is not in acute  distress.    Appearance: Normal appearance. She is obese. She is not ill-appearing or toxic-appearing.  Cardiovascular:     Rate and Rhythm: Normal rate and regular rhythm.     Heart sounds: Normal heart sounds. No murmur heard.   No friction rub. No gallop.  Pulmonary:     Effort: Pulmonary effort is normal. No respiratory distress.     Breath sounds: Normal breath sounds. No stridor. No wheezing, rhonchi or rales.  Abdominal:     General: Bowel sounds are normal.     Palpations: Abdomen is soft.  Musculoskeletal:     Right lower leg: Edema present.     Left lower leg: Edema present.  Neurological:     Mental Status: She is alert.          Assessment & Plan:  Dyspnea, unspecified type - Plan: EKG 12-Lead, CBC with Differential/Platelet, COMPLETE METABOLIC PANEL WITH GFR, Brain natriuretic peptide  Dysuria - Plan: Urine Culture, Urinalysis, Routine w reflex microscopic Given the swelling, I believe that the patient is likely having shortness of breath due to pulmonary edema despite her lungs being clear to auscultation bilaterally.  Given the EKG changes and the fact the patient has been having chest pain that caused her to use nitroglycerin yesterday, I feel that she needs an emergency room evaluation for troponins, chest x-ray, and cardiac enzymes.  I am concerned that her shortness of breath is related to cardiac ischemia.  Patient refuses to go to the hospital [place].  After much discussion, I was able to convince the patient to go to the hospital but she adamantly refuses to allow me to call an ambulance.  I do not feel that the patient is in a life-threatening emergency at this moment.  Therefore I will compromise with the patient and allow her to drive herself to the local emergency room for further evaluation.  However, I was clear with the patient that she needs to go to the emergency room.

## 2021-03-05 NOTE — ED Provider Notes (Signed)
Care assumed from previous provider PA Rebekah Sponseller. Please see note for further details.  In short, patient is a 70 year old female with a past medical history of congestive heart failure, stented NSTEMI who came in for 3 days of chest pain and shortness of breath.  Nitroglycerin has helped her but her pain always comes back.  Compliant with her torsemide.  The plan is to admit the patient for NSTEMI.  Cardiology has been paged to follow the patient.  I will page hospitalist for admission and further management.  Physical Exam  BP 138/66 (BP Location: Right Arm)   Pulse 84   Temp 98 F (36.7 C) (Oral)   Resp 12   Ht 5\' 5"  (1.651 m)   Wt 124.7 kg   SpO2 97%   BMI 45.76 kg/m    ED Course/Procedures   I received a call back from Dr. Roosevelt Locks with hospital medicine who has accepted the patient for admission.  Patient has been notified of this plan and has no further questions at this time.     Rhae Hammock, PA-C 03/05/21 1634    Sherwood Gambler, MD 03/08/21 [phone]

## 2021-03-05 NOTE — H&P (Addendum)
History and Physical    Susan [name] ERD:[id number] DOB: December 19, 1950 DOA: 03/05/2021  PCP: [name], MD (Confirm with patient/family/NH records and if not entered, this has to be entered at [facility] point of entry) Patient coming from: Home  I have personally briefly reviewed patient's old medical records in [place]  Chief Complaint: Chest pain, SOB  HPI: Susan [name] is a 70 y.o. 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  Patient started to have dry cough and shortness of breath since Friday.  Denies any fever chills.  Then Saturday, she developed pressure-like chest pain, episodic, centrally located, along with worsening of shortness of breath. She took nitroglycerin 2 times Saturday and 3 times on Sunday, despite, she continued to experience chest pains yesterday and worsening of shortness of breath and leg swelling.  She also states that recently fingerstick> 500, her endocrinologist has increased her insulin pump setting several times recently.  ED Course: Patient was found to have elevated blood pressure, O2 saturation borderline hypoxic, x-ray showed bilateral pulmonary congestion.  Troponins 247> 236.  EKG no new ST changes.  Heparin drip started in the ED as well as 1 dose of IV Lasix.  Review of Systems: As per HPI otherwise 14 point review of systems negative.    Past Medical History:  Diagnosis Date   Acute MI ([place]) 1999; 2007   Anemia    hx   Anginal pain ([place])    Anxiety    ARF (acute renal failure) ([place]) 06/2017   [facility]   Arthritis    "generalized" (03/15/2014)   CAD (coronary artery disease)    MI in 2000 - MI  2007 - treated bare metal stent (no nuclear since then as 9/11)   Carotid artery disease (HCC)    CHF (congestive heart failure) ([place])    Chronic diastolic heart  failure ([place])    a) ECHO (08/2013) EF 55-60% and RV function nl b) RHC (08/2013) RA 4, RV 30/5/7, PA 25/10 (16), PCWP 7, Fick CO/CI 6.3/2.7, PVR 1.5 WU, PA 61 and 66%   Daily headache    "~ every other day; since I fell in June" (03/15/2014)   Depression    Dyslipidemia    Dyspnea    Exertional shortness of breath    HTN (hypertension)    Hypothyroidism    Neuropathy    Obesity    Osteoarthritis    Peripheral neuropathy    PONV (postoperative nausea and vomiting)    RBBB (right bundle branch block)    Old   Stroke ([place])    mini strokes   Syncope    likely due to low blood sugar   Tachycardia    Sinus tachycardia   Type II diabetes mellitus ([place])    Type II   Urinary incontinence    Venous insufficiency     Past Surgical History:  Procedure Laterality Date   ABDOMINAL HYSTERECTOMY  1980's   AMPUTATION Right 02/24/2018   Procedure: RIGHT FOOT GREAT TOE AND 2ND TOE AMPUTATION;  Surgeon: Newt Minion, MD;  Location: [place];  Service: Orthopedics;  Laterality: Right;   AMPUTATION Right 04/30/2018   Procedure: RIGHT TRANSMETATARSAL AMPUTATION;  Surgeon: Newt Minion, MD;  Location: [place];  Service: Orthopedics;  Laterality: Right;   BIOPSY  05/27/2020   Procedure: BIOPSY;  Surgeon: [name], DO;  [facility];  Service: Endoscopy;;   CATARACT EXTRACTION, BILATERAL Bilateral ?2013   COLONOSCOPY W/ POLYPECTOMY     COLONOSCOPY WITH PROPOFOL N/A 03/13/2019   Procedure: COLONOSCOPY WITH PROPOFOL;  Surgeon: Jerene Bears, MD;  Location: [place];  Service: Gastroenterology;  Laterality: N/A;   [place]; 2007   "1 + 1"   ESOPHAGOGASTRODUODENOSCOPY (EGD) WITH PROPOFOL N/A 03/13/2019   Procedure: ESOPHAGOGASTRODUODENOSCOPY (EGD) WITH PROPOFOL;  Surgeon: Jerene Bears, MD;  Location: [place];  Service: Gastroenterology;  Laterality: N/A;   ESOPHAGOGASTRODUODENOSCOPY (EGD) WITH PROPOFOL N/A 05/27/2020   Procedure:  ESOPHAGOGASTRODUODENOSCOPY (EGD) WITH PROPOFOL;  Surgeon: [name], DO;  [facility];  Service: Endoscopy;  Laterality: N/A;   EYE SURGERY Bilateral    lazer   HEMOSTASIS CLIP PLACEMENT  03/13/2019   Procedure: HEMOSTASIS CLIP PLACEMENT;  Surgeon: Jerene Bears, MD;  Location: [facility];  Service: Gastroenterology;;   KNEE ARTHROSCOPY Left 10/25/2006   POLYPECTOMY  03/13/2019   Procedure: POLYPECTOMY;  Surgeon: Jerene Bears, MD;  Location: [place];  Service: Gastroenterology;;   RIGHT HEART CATH N/A 07/24/2017   Procedure: RIGHT HEART CATH;  Surgeon: Jolaine Artist, MD;  Location: [place] [facility] LAB;  Service: Cardiovascular;  Laterality: N/A;   RIGHT HEART CATHETERIZATION N/A 09/22/2013   Procedure: RIGHT HEART CATH;  Surgeon: Jolaine Artist, MD;  Location: [facility] CATH LAB;  Service: Cardiovascular;  Laterality: N/A;   SHOULDER ARTHROSCOPY WITH OPEN ROTATOR CUFF REPAIR Right 03/14/2014   Procedure: RIGHT SHOULDER ARTHROSCOPY WITH BICEPS RELEASE, OPEN SUBSCAPULA REPAIR, OPEN SUPRASPINATUS REPAIR.;  Surgeon: Meredith Pel, MD;  Location: [place];  Service: Orthopedics;  Laterality: Right;   TOE AMPUTATION Right 02/24/2018   GREAT TOE AND 2ND TOE AMPUTATION   TUBAL LIGATION  1970's     reports that she quit smoking about 23 years ago. Her smoking use included cigarettes. She has a 96.00 pack-year smoking history. She has never used smokeless tobacco. She reports that she does not currently use alcohol. She reports that she does not use drugs.  Allergies  Allergen Reactions   Drug Ingredient [Cephalexin] Diarrhea    Pt reports heavy diarrhea with use of keflex   Codeine Nausea And Vomiting    Family History  Problem Relation Age of Onset   Heart attack Mother 15     Prior to Admission medications   Medication Sig Start Date End Date Taking? Authorizing Provider  acetaminophen (TYLENOL) 500 MG tablet Take 1,000 mg by mouth every 6 (six) hours as  needed for moderate pain or headache.    [provider]  albuterol (VENTOLIN HFA) 108 (90 Base) MCG/ACT inhaler TAKE 2 PUFFS BY MOUTH EVERY 6 HOURS AS NEEDED FOR WHEEZE OR SHORTNESS OF BREATH 06/27/19   Converse, [name], MD  allopurinol (ZYLOPRIM) 100 MG tablet TAKE 1 TABLET BY MOUTH TWICE A DAY 08/02/19   Newt Minion, MD  Ascorbic Acid (VITAMIN C) 1000 MG tablet Take 1,000 mg by mouth daily.    [provider]  aspirin EC 81 MG tablet Take 1 tablet (81 mg total) by mouth daily with breakfast. 05/28/20   Emokpae, Courage, MD  carvedilol (COREG) 12.5 MG tablet TAKE 1 TABLET (12.5 MG TOTAL) BY MOUTH 2 (TWO) TIMES DAILY WITH A MEAL. 08/13/20   [name], MD  cephALEXin (KEFLEX) 250 MG capsule TAKE 1 CAPSULE BY MOUTH DAILY. 03/04/21  [name], MD  cholecalciferol (VITAMIN D) 1000 units tablet Take 1,000 Units by mouth daily with supper.     [provider]  clopidogrel (PLAVIX) 75 MG tablet TAKE 1 TABLET BY MOUTH DAILY WITH BREAKFAST. 08/15/20   Clegg, Amy D, NP  ferrous sulfate 325 (65 FE) MG tablet Take 1 tablet (325 mg total) by mouth 2 (two) times daily with a meal. Reported on 08/21/2015 06/07/18   Alycia Rossetti, MD  FLUoxetine (PROZAC) 20 MG capsule TAKE 1 CAPSULE BY MOUTH EVERY DAY 09/21/20   [name], MD  guaiFENesin-dextromethorphan (ROBITUSSIN DM) 100-10 MG/5ML syrup Take 10 mLs by mouth every 4 (four) hours as needed for cough. Patient not taking: Reported on 02/04/2021 03/13/20   [place] Donnamarie Poag, DO  Insulin Disposable Pump (OMNIPOD DASH 5 PACK PODS) MISC Inject into the skin See admin instructions. Use continuously with Novolin R - change every 72 hours. 11/18/18   [provider]  insulin NPH Human (HUMULIN N,NOVOLIN N) 100 UNIT/ML injection Inject 15 Units into the skin See admin instructions. Inject 15 units subcutaneously in the morning if CBG >300;    [provider]  insulin regular (NOVOLIN R) 100 units/mL injection  Inject into the skin See admin instructions. Manually add bolus to continuous dose via OmniPod 3 times daily per sliding scale (CBG 80-150 7 units, 151-200 9 units, 201-250 12 units, 251-300 14 units, 301- 400 17 units)    [provider]  levothyroxine (SYNTHROID, LEVOTHROID) 50 MCG tablet Take 1 tablet (50 mcg total) by mouth daily before breakfast. 11/07/16   Dena Billet B, PA-C  magnesium oxide (MAG-OX) 400 MG tablet TAKE 1 TABLET BY MOUTH EVERY DAY 07/23/20   Alycia Rossetti, MD  Multiple Vitamins-Minerals (AIRBORNE GUMMIES PO) Take 2 tablets by mouth every evening.    [provider]  Multiple Vitamins-Minerals (CENTRUM SILVER 50+WOMEN) TABS Take 1 tablet by mouth every evening.    [provider]  Multiple Vitamins-Minerals (ZINC PO) Take 1 tablet by mouth every evening.    [provider]  nitroGLYCERIN (NITROSTAT) 0.4 MG SL tablet PLACE 1 TABLET (0.4 MG TOTAL) UNDER THE TONGUE EVERY 5 (FIVE) MINUTES AS NEEDED FOR CHEST PAIN. 02/06/21   Larey Dresser, MD  [facility] VERIO test strip 3 (three) times daily. 07/25/20   [provider]  polyethylene glycol (MIRALAX / GLYCOLAX) 17 g packet Take 17 g by mouth daily. 08/19/20   Lavina Hamman, MD  pregabalin (LYRICA) 150 MG capsule Take 1 capsule (150 mg total) by mouth 2 (two) times daily. 11/12/16   Alycia Rossetti, MD  torsemide (DEMADEX) 20 MG tablet Take 100 mg by mouth 2 (two) times daily.    [provider]  traZODone (DESYREL) 100 MG tablet Take 100 mg by mouth as needed for sleep. Patient takes 2 tablets by mouth as needed.    [provider]  ursodiol (ACTIGALL) 500 MG tablet Take 500 mg by mouth 3 (three) times daily. 12/11/20   [provider]    Physical Exam: Vitals:   03/05/21 1700 03/05/21 1715 03/05/21 1740 03/05/21 1810  BP: (!) 146/99 (!) 164/74 (!) 181/151 (!) 149/80  Pulse: 82 83 84 83  Resp: 16 15 14 16   Temp:      TempSrc:      SpO2: 97% 97% 96% 94%   Weight:      Height:        Constitutional: NAD, calm, comfortable Vitals:  03/05/21 1700 03/05/21 1715 03/05/21 1740 03/05/21 1810  BP: (!) 146/99 (!) 164/74 (!) 181/151 (!) 149/80  Pulse: 82 83 84 83  Resp: 16 15 14 16   Temp:      TempSrc:      SpO2: 97% 97% 96% 94%  Weight:      Height:       Eyes: PERRL, lids and conjunctivae normal ENMT: Mucous membranes are moist. Posterior pharynx clear of any exudate or lesions.Normal dentition.  Neck: normal, supple, no masses, no thyromegaly Respiratory: clear to auscultation bilaterally, no wheezing, fine crackles on bilateral bases.  Increasing respiratory effort. No accessory muscle use.  Cardiovascular: Regular rate and rhythm, no murmurs / rubs / gallops.  2+ extremity edema. 2+ pedal pulses. No carotid bruits.  Abdomen: no tenderness, no masses palpated. No hepatosplenomegaly. Bowel sounds positive.  Musculoskeletal: no clubbing / cyanosis. No joint deformity upper and lower extremities. Good ROM, no contractures. Normal muscle tone.  Skin: no rashes, lesions, ulcers. No induration Neurologic: CN 2-12 grossly intact. Sensation intact, DTR normal. Strength 5/5 in all 4.  Psychiatric: Normal judgment and insight. Alert and oriented x 3. Normal mood.     Labs on Admission: I have personally reviewed following labs and imaging studies  CBC: Recent Labs  Lab 03/05/21 1236  WBC 9.9  HGB 11.4*  HCT 36.2  MCV 85.8  PLT 993   Basic Metabolic Panel: Recent Labs  Lab 03/05/21 1236  NA 131*  K 3.7  CL 93*  CO2 25  GLUCOSE 339*  BUN 109*  CREATININE 4.01*  CALCIUM 8.9   GFR: Estimated Creatinine Clearance: 17.1 mL/min (A) (by C-G formula based on SCr of 4.01 mg/dL (H)). Liver Function Tests: No results for input(s): AST, ALT, ALKPHOS, BILITOT, PROT, ALBUMIN in the last 168 hours. No results for input(s): LIPASE, AMYLASE in the last 168 hours. No results for input(s): AMMONIA in the last 168 hours. Coagulation  Profile: Recent Labs  Lab 03/05/21 1545  INR 0.9   Cardiac Enzymes: No results for input(s): CKTOTAL, CKMB, CKMBINDEX, TROPONINI in the last 168 hours. BNP (last 3 results) No results for input(s): PROBNP in the last 8760 hours. HbA1C: No results for input(s): HGBA1C in the last 72 hours. CBG: No results for input(s): GLUCAP in the last 168 hours. Lipid Profile: No results for input(s): CHOL, HDL, LDLCALC, TRIG, CHOLHDL, LDLDIRECT in the last 72 hours. Thyroid Function Tests: No results for input(s): TSH, T4TOTAL, FREET4, T3FREE, THYROIDAB in the last 72 hours. Anemia Panel: No results for input(s): VITAMINB12, FOLATE, FERRITIN, TIBC, IRON, RETICCTPCT in the last 72 hours. Urine analysis:    Component Value Date/Time   COLORURINE YELLOW 09/10/2020 1250   APPEARANCEUR CLOUDY (A) 09/10/2020 1250   LABSPEC 1.015 09/10/2020 1250   PHURINE 5.5 09/10/2020 1250   GLUCOSEU 2+ (A) 09/10/2020 1250   HGBUR TRACE (A) 09/10/2020 1250   BILIRUBINUR NEGATIVE 09/04/2020 1421   BILIRUBINUR negative 06/05/2020 1334   KETONESUR NEGATIVE 09/10/2020 1250   PROTEINUR 2+ (A) 09/10/2020 1250   UROBILINOGEN 0.2 06/05/2020 1334   UROBILINOGEN 0.2 04/01/2014 1659   NITRITE NEGATIVE 09/10/2020 1250   LEUKOCYTESUR TRACE (A) 09/10/2020 1250    Radiological Exams on Admission: DG Chest 2 View  Result Date: 03/05/2021 CLINICAL DATA:  Cough, shortness of breath EXAM: CHEST - 2 VIEW COMPARISON:  Chest radiograph 08/24/2020 FINDINGS: The cardiomediastinal silhouette is within normal limits. There are increased interstitial markings throughout both lungs. There are trace bilateral pleural effusions. There is  no pneumothorax. There is no acute osseous abnormality.  Sh IMPRESSION: Suspect mild pulmonary interstitial edema with trace pleural effusions. Electronically Signed   By: Valetta Mole M.D.   On: 03/05/2021 13:17    EKG: Independently reviewed.  Sinus, chronic RBBB and left axis deviation, no acute ST  changes. Assessment/Plan Active Problems:   NSTEMI (non-ST elevated myocardial infarction) ([place])  (please populate well all problems here in Problem List. (For example, if patient is on BP meds at home and you resume or decide to hold them, it is a problem that needs to be her. Same for CAD, COPD, HLD and so on)  NSTEMI -Versus demand ischemia.  But given significant history of CAD and no recent cardiac work-up, agreed with heparin drip for now.  Echocardiogram. -Aspirin Plavix -Beta-blocker -Cardiology on board  Acute on chronic diastolic CHF decompensation -Significant fluid overload -Cardiology ordered Lasix 120 mg IV twice daily.  Monitor blood pressure to adjust her BP meds.  IDDM with hyperglycemia -Continue her insulin pump, add sliding scale. -Likely has insulin resistance, start Januvia.  CKD stage IV -Cre level stable, fluid overload, on IV Lasix -She has a chronic uremia, but BUN has been stable, and mentation at baseline, and no significant acidosis or hyperkalemia. No indication for HD. -BMP daily.  HTN -Continue Coreg  Hx of recurrent UTIs -On prophylactic Keflex chronically.  DVT prophylaxis: Heparin drip Code Status: Full code Family Communication: None at bedside Disposition Plan: Expect more than 2 midnight hospital stay to treat CHF decompensation and NSTEMI Consults called: Cardiology Admission status: PCU   Lequita Halt MD [facility] Pager [phone]  03/05/2021, 6:19 PM

## 2021-03-05 NOTE — ED Notes (Signed)
Cardiology MD at BS ?

## 2021-03-05 NOTE — ED Provider Notes (Signed)
[facility] EMERGENCY DEPARTMENT Provider Note   CSN: [id number] Arrival date & time: 03/05/21  1208     History No chief complaint on file.   Susan [name] is a 70 y.o. female who presents with concern for shortness of breath, dry cough, and left-sided chest pain that radiates to the neck and the back x3 days.  Patient states she has been taking nitroglycerin at home with improvement in her chest pain but recurs shortly after nitro administration.  Patient does have history of CHF as well as CAD status post stenting in the past.  She is anticoagulated on aspirin and Plavix.  She is compliant with her torsemide but does endorse decreased urine output in the last 72 hours.  She is on oxygen at nighttime for the last few days but does not regularly require oxygen through the day.  Additionally sleeps with 2 pillows behind her bed secondary to orthopnea.  I personally read this patient's medical records.  She has type 2 diabetes, chronic diastolic heart failure, TIAs, coronary artery disease.  Echocardiogram in 9 of 2021 revealed LVEF 55 to 60%.  HPI     Past Medical History:  Diagnosis Date   Acute MI ([place]) 1999; 2007   Anemia    hx   Anginal pain ([place])    Anxiety    ARF (acute renal failure) ([place]) 06/2017   [facility]   Arthritis    "generalized" (03/15/2014)   CAD (coronary artery disease)    MI in 2000 - MI  2007 - treated bare metal stent (no nuclear since then as 9/11)   Carotid artery disease (HCC)    CHF (congestive heart failure) ([place])    Chronic diastolic heart failure ([place])    a) ECHO (08/2013) EF 55-60% and RV function nl b) RHC (08/2013) RA 4, RV 30/5/7, PA 25/10 (16), PCWP 7, Fick CO/CI 6.3/2.7, PVR 1.5 WU, PA 61 and 66%   Daily headache    "~ every other day; since I fell in June" (03/15/2014)   Depression    Dyslipidemia    Dyspnea    Exertional shortness of breath    HTN (hypertension)    Hypothyroidism    Neuropathy    Obesity     Osteoarthritis    Peripheral neuropathy    PONV (postoperative nausea and vomiting)    RBBB (right bundle branch block)    Old   Stroke ([place])    mini strokes   Syncope    likely due to low blood sugar   Tachycardia    Sinus tachycardia   Type II diabetes mellitus (HCC)    Type II   Urinary incontinence    Venous insufficiency     Patient Active Problem List   Diagnosis Date Noted   Acute renal failure superimposed on stage 4 chronic kidney disease ([place]) 08/22/2020   Hypoalbuminemia 05/25/2020   GERD (gastroesophageal reflux disease) 05/25/2020   Pressure injury of skin 05/17/2020   Type 2 diabetes mellitus with diabetic polyneuropathy, with long-term current use of insulin ([place]) 03/07/2020   Obesity, Class III, BMI 40-49.9 (morbid obesity) ([place]) 03/07/2020   Common bile duct (CBD) obstruction 05/28/2019   Benign neoplasm of ascending colon    Benign neoplasm of transverse colon    Benign neoplasm of descending colon    Benign neoplasm of sigmoid colon    Gastric polyps    Hyperkalemia 03/11/2019   Prolonged QT interval 03/11/2019   Onychomycosis 06/21/2018   Osteomyelitis  of second toe of right foot ([place])    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 (HCC)    PVD (peripheral vascular disease) ([place]) 10/26/2017   E-coli UTI 07/27/2017   Hypothyroidism 07/27/2017   PAH (pulmonary artery hypertension) (HCC)    Impaired ambulation 07/19/2017   Leg cramps 02/27/2017   Peripheral edema 01/12/2017   Diabetic neuropathy ([place]) 11/12/2016   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 mellitus ([place]) 10/24/2015   Anemia 10/03/2015   Generalized anxiety disorder 10/03/2015   Insomnia 10/03/2015   Hyperglycemia due to diabetes mellitus ([place]) 06/07/2015   Chronic diastolic CHF (congestive heart failure) ([place]) 06/07/2015   Non compliance with medical treatment 04/17/2014   Rotator cuff tear 03/14/2014   Class 3 obesity ([place]) 09/23/2013   Hypotension 12/25/2012   Urinary  incontinence    MDD (major depressive disorder) 11/12/2010   RBBB (right bundle branch block)    Coronary artery disease involving native coronary artery of native heart without angina pectoris    Hyperlipemia 01/22/2009   Essential hypertension 01/22/2009    Past Surgical History:  Procedure Laterality Date   ABDOMINAL HYSTERECTOMY  1980's   AMPUTATION Right 02/24/2018   Procedure: RIGHT FOOT GREAT TOE AND 2ND TOE AMPUTATION;  Surgeon: Newt Minion, MD;  Location: [facility];  Service: Orthopedics;  Laterality: Right;   AMPUTATION Right 04/30/2018   Procedure: RIGHT TRANSMETATARSAL AMPUTATION;  Surgeon: Newt Minion, MD;  Location: [facility];  Service: Orthopedics;  Laterality: Right;   BIOPSY  05/27/2020   Procedure: BIOPSY;  Surgeon: [name], DO;  [facility];  Service: Endoscopy;;   CATARACT EXTRACTION, BILATERAL Bilateral ?2013   COLONOSCOPY W/ POLYPECTOMY     COLONOSCOPY WITH PROPOFOL N/A 03/13/2019   Procedure: COLONOSCOPY WITH PROPOFOL;  Surgeon: Jerene Bears, MD;  Location: [facility];  Service: Gastroenterology;  Laterality: N/A;   [place]; 2007   "1 + 1"   ESOPHAGOGASTRODUODENOSCOPY (EGD) WITH PROPOFOL N/A 03/13/2019   Procedure: ESOPHAGOGASTRODUODENOSCOPY (EGD) WITH PROPOFOL;  Surgeon: Jerene Bears, MD;  Location: [facility];  Service: Gastroenterology;  Laterality: N/A;   ESOPHAGOGASTRODUODENOSCOPY (EGD) WITH PROPOFOL N/A 05/27/2020   Procedure: ESOPHAGOGASTRODUODENOSCOPY (EGD) WITH PROPOFOL;  Surgeon: [name], DO;  [facility];  Service: Endoscopy;  Laterality: N/A;   EYE SURGERY Bilateral    lazer   HEMOSTASIS CLIP PLACEMENT  03/13/2019   Procedure: HEMOSTASIS CLIP PLACEMENT;  Surgeon: Jerene Bears, MD;  Location: [facility] ENDOSCOPY;  Service: Gastroenterology;;   KNEE ARTHROSCOPY Left 10/25/2006   POLYPECTOMY  03/13/2019   Procedure: POLYPECTOMY;  Surgeon: Jerene Bears, MD;  Location: [facility];  Service: Gastroenterology;;   RIGHT HEART CATH N/A 07/24/2017   Procedure: RIGHT HEART CATH;  Surgeon: Jolaine Artist, MD;  Location: [facility] CV LAB;  Service: Cardiovascular;  Laterality: N/A;   RIGHT HEART CATHETERIZATION N/A 09/22/2013   Procedure: RIGHT HEART CATH;  Surgeon: Jolaine Artist, MD;  Location[facility] CATH LAB;  Service: Cardiovascular;  Laterality: N/A;   SHOULDER ARTHROSCOPY WITH OPEN ROTATOR CUFF REPAIR Right 03/14/2014   Procedure: RIGHT SHOULDER ARTHROSCOPY WITH BICEPS RELEASE, OPEN SUBSCAPULA REPAIR, OPEN SUPRASPINATUS REPAIR.;  Surgeon: Meredith Pel, MD;  Location: [facility];  Service: Orthopedics;  Laterality: Right;   TOE AMPUTATION Right 02/24/2018   GREAT TOE AND 2ND TOE AMPUTATION   TUBAL LIGATION  1970's     OB History   No obstetric  history on file.     Family History  Problem Relation Age of Onset   Heart attack Mother 20    Social History   Tobacco Use   Smoking status: Former    Packs/day: 3.00    Years: 32.00    Pack years: 96.00    Types: Cigarettes    Quit date: 10/24/1997    Years since quitting: 23.3   Smokeless tobacco: Never  Vaping Use   Vaping Use: Never used  Substance Use Topics   Alcohol use: Not Currently    Comment: "might have 2-3 daiquiris in the summer"   Drug use: No    Home Medications Prior to Admission medications   Medication Sig Start Date End Date Taking? Authorizing Provider  acetaminophen (TYLENOL) 500 MG tablet Take 1,000 mg by mouth every 6 (six) hours as needed for moderate pain or headache.    [provider]  albuterol (VENTOLIN HFA) 108 (90 Base) MCG/ACT inhaler TAKE 2 PUFFS BY MOUTH EVERY 6 HOURS AS NEEDED FOR WHEEZE OR SHORTNESS OF BREATH 06/27/19   [place], [name], MD  allopurinol (ZYLOPRIM) 100 MG tablet TAKE 1 TABLET BY MOUTH TWICE A DAY 08/02/19   Newt Minion, MD  Ascorbic Acid (VITAMIN C) 1000 MG tablet Take 1,000 mg by mouth daily.    [provider]  aspirin EC  81 MG tablet Take 1 tablet (81 mg total) by mouth daily with breakfast. 05/28/20   Emokpae, Courage, MD  carvedilol (COREG) 12.5 MG tablet TAKE 1 TABLET (12.5 MG TOTAL) BY MOUTH 2 (TWO) TIMES DAILY WITH A MEAL. 08/13/20   [name], MD  cephALEXin (KEFLEX) 250 MG capsule TAKE 1 CAPSULE BY MOUTH DAILY. 03/04/21   [name], MD  cholecalciferol (VITAMIN D) 1000 units tablet Take 1,000 Units by mouth daily with supper.     [provider]  clopidogrel (PLAVIX) 75 MG tablet TAKE 1 TABLET BY MOUTH DAILY WITH BREAKFAST. 08/15/20   Clegg, Amy D, NP  ferrous sulfate 325 (65 FE) MG tablet Take 1 tablet (325 mg total) by mouth 2 (two) times daily with a meal. Reported on 08/21/2015 06/07/18   Alycia Rossetti, MD  FLUoxetine (PROZAC) 20 MG capsule TAKE 1 CAPSULE BY MOUTH EVERY DAY 09/21/20   [name], MD  guaiFENesin-dextromethorphan (ROBITUSSIN DM) 100-10 MG/5ML syrup Take 10 mLs by mouth every 4 (four) hours as needed for cough. Patient not taking: Reported on 02/04/2021 03/13/20   [place] Donnamarie Poag, DO  Insulin Disposable Pump (OMNIPOD DASH 5 PACK PODS) MISC Inject into the skin See admin instructions. Use continuously with Novolin R - change every 72 hours. 11/18/18   [provider]  insulin NPH Human (HUMULIN N,NOVOLIN N) 100 UNIT/ML injection Inject 15 Units into the skin See admin instructions. Inject 15 units subcutaneously in the morning if CBG >300;    [provider]  insulin regular (NOVOLIN R) 100 units/mL injection Inject into the skin See admin instructions. Manually add bolus to continuous dose via OmniPod 3 times daily per sliding scale (CBG 80-150 7 units, 151-200 9 units, 201-250 12 units, 251-300 14 units, 301- 400 17 units)    [provider]  levothyroxine (SYNTHROID, LEVOTHROID) 50 MCG tablet Take 1 tablet (50 mcg total) by mouth daily before breakfast. 11/07/16   Dena Billet B, PA-C  magnesium oxide (MAG-OX) 400 MG tablet TAKE 1 TABLET BY  MOUTH EVERY DAY 07/23/20   Alycia Rossetti, MD  Multiple Vitamins-Minerals (  AIRBORNE GUMMIES PO) Take 2 tablets by mouth every evening.    [provider]  Multiple Vitamins-Minerals (CENTRUM SILVER 50+WOMEN) TABS Take 1 tablet by mouth every evening.    [provider]  Multiple Vitamins-Minerals (ZINC PO) Take 1 tablet by mouth every evening.    [provider]  nitroGLYCERIN (NITROSTAT) 0.4 MG SL tablet PLACE 1 TABLET (0.4 MG TOTAL) UNDER THE TONGUE EVERY 5 (FIVE) MINUTES AS NEEDED FOR CHEST PAIN. 02/06/21   Larey Dresser, MD  [facility] VERIO test strip 3 (three) times daily. 07/25/20   [provider]  polyethylene glycol (MIRALAX / GLYCOLAX) 17 g packet Take 17 g by mouth daily. 08/19/20   Lavina Hamman, MD  pregabalin (LYRICA) 150 MG capsule Take 1 capsule (150 mg total) by mouth 2 (two) times daily. 11/12/16   Alycia Rossetti, MD  torsemide (DEMADEX) 20 MG tablet Take 100 mg by mouth 2 (two) times daily.    [provider]  traZODone (DESYREL) 100 MG tablet Take 100 mg by mouth as needed for sleep. Patient takes 2 tablets by mouth as needed.    [provider]  ursodiol (ACTIGALL) 500 MG tablet Take 500 mg by mouth 3 (three) times daily. 12/11/20   [provider]    Allergies    Drug ingredient [cephalexin] and Codeine  Review of Systems   Review of Systems  Constitutional: Negative.   HENT: Negative.    Respiratory:  Positive for cough and shortness of breath.   Cardiovascular:  Positive for chest pain. Negative for palpitations and leg swelling.  Gastrointestinal: Negative.   Genitourinary: Negative.   Musculoskeletal: Negative.   Neurological:  Positive for weakness. Negative for dizziness, syncope, facial asymmetry, light-headedness and headaches.   Physical Exam Updated Vital Signs BP (!) 158/76   Pulse 78   Temp 98 F (36.7 C) (Oral)   Resp 18   Ht 5\' 5"  (1.651 m)   Wt 124.7 kg   SpO2 97%   BMI 45.76  kg/m   Physical Exam Vitals and nursing note reviewed.  Constitutional:      General: She is not in acute distress.    Appearance: She is obese. She is not toxic-appearing.  HENT:     Head: Normocephalic and atraumatic.     Nose: Nose normal. No congestion.     Mouth/Throat:     Mouth: Mucous membranes are moist.     Pharynx: Oropharynx is clear. Uvula midline. No oropharyngeal exudate or posterior oropharyngeal erythema.     Tonsils: No tonsillar exudate.  Eyes:     General: Lids are normal. Vision grossly intact.        Right eye: No discharge.        Left eye: No discharge.     Extraocular Movements: Extraocular movements intact.     Conjunctiva/sclera: Conjunctivae normal.     Pupils: Pupils are equal, round, and reactive to light.  Neck:     Trachea: Trachea and phonation normal.  Cardiovascular:     Rate and Rhythm: Normal rate and regular rhythm.     Pulses:          Dorsalis pedis pulses are detected w/ Doppler on the right side and detected w/ Doppler on the left side.     Heart sounds: Normal heart sounds. No murmur heard. Pulmonary:     Effort: Pulmonary effort is normal. No tachypnea, bradypnea, accessory muscle usage, prolonged expiration or respiratory distress.     Breath  sounds: Examination of the left-lower field reveals rales. Rales present. No wheezing.  Chest:     Chest wall: No mass, lacerations, deformity, swelling, tenderness, crepitus or edema.  Abdominal:     General: Bowel sounds are normal. There is no distension.     Palpations: Abdomen is soft.     Tenderness: There is no abdominal tenderness. There is no right CVA tenderness, left CVA tenderness, guarding or rebound.  Musculoskeletal:        General: No deformity.     Cervical back: Normal range of motion and neck supple. No edema, rigidity or crepitus. No pain with movement, spinous process tenderness or muscular tenderness.     Right lower leg: 1+ Edema present.     Left lower leg: 1+ Edema  present.  Lymphadenopathy:     Cervical: No cervical adenopathy.  Skin:    General: Skin is warm and dry.     Capillary Refill: Capillary refill takes less than 2 seconds.  Neurological:     General: No focal deficit present.     Mental Status: She is alert and oriented to person, place, and time. Mental status is at baseline.     GCS: GCS eye subscore is 4. GCS verbal subscore is 5. GCS motor subscore is 6.     Gait: Gait is intact. Gait normal.  Psychiatric:        Mood and Affect: Mood normal.    ED Results / Procedures / Treatments   Labs (all labs ordered are listed, but only abnormal results are displayed) Labs Reviewed  BASIC METABOLIC PANEL - Abnormal; Notable for the following components:      Result Value   Sodium 131 (*)    Chloride 93 (*)    Glucose, Bld 339 (*)    BUN 109 (*)    Creatinine, Ser 4.01 (*)    GFR, Estimated 11 (*)    All other components within normal limits  CBC - Abnormal; Notable for the following components:   Hemoglobin 11.4 (*)    RDW 15.9 (*)    All other components within normal limits  BRAIN NATRIURETIC PEPTIDE - Abnormal; Notable for the following components:   B Natriuretic Peptide 584.6 (*)    All other components within normal limits  TROPONIN I (HIGH SENSITIVITY) - Abnormal; Notable for the following components:   Troponin I (High Sensitivity) 247 (*)    All other components within normal limits  TROPONIN I (HIGH SENSITIVITY) - Abnormal; Notable for the following components:   Troponin I (High Sensitivity) 236 (*)    All other components within normal limits  RESP PANEL BY RT-PCR (FLU A&B, COVID) ARPGX2  PROTIME-INR  HEPARIN LEVEL (UNFRACTIONATED)    EKG None  Radiology DG Chest 2 View  Result Date: 03/05/2021 CLINICAL DATA:  Cough, shortness of breath EXAM: CHEST - 2 VIEW COMPARISON:  Chest radiograph 08/24/2020 FINDINGS: The cardiomediastinal silhouette is within normal limits. There are increased interstitial markings  throughout both lungs. There are trace bilateral pleural effusions. There is no pneumothorax. There is no acute osseous abnormality.  Sh IMPRESSION: Suspect mild pulmonary interstitial edema with trace pleural effusions. Electronically Signed   By: Valetta Mole M.D.   On: 03/05/2021 13:17    Procedures Procedures   Medications Ordered in ED Medications  heparin ADULT infusion 100 units/mL (25000 units/23mL) (1,050 Units/hr Intravenous New Bag/Given 03/05/21 1605)  furosemide (LASIX) injection 20 mg (has no administration in time range)  heparin bolus via infusion 4,000  Units (4,000 Units Intravenous Bolus from Bag 03/05/21 1606)    ED Course  I have reviewed the triage vital signs and the nursing notes.  Pertinent labs & imaging results that were available during my care of the patient were reviewed by me and considered in my medical decision making (see chart for details).  Clinical Course as of 03/05/21 1621  Tue Mar 05, 2021  1448 Sodium(!): 131 [MS]  1621 Consult to cards master, Wannetta Sender, patient will be added to their list.  [RS]    Clinical Course User Index [MS] Elta Guadeloupe, Student-PA [RS] Turquoise Esch, Sharlene Dory   MDM Rules/Calculators/A&P                         70 year old female with history of CHF and CAD anticoagulated on aspirin and Plavix who presents with concern for shortness of breath and left-sided chest pain x3 days relieved by nitro.  Differential diagnosis includes but is not limited to ACS, PE, pleural effusion, pneumothorax, CHF exacerbation.  Vital signs are normal intake.  Cardiopulmonary exam is normal, abdominal exam is benign.  Palpable radial pulses bilaterally and dopplerable DP pulses bilaterally.  Patient is not in any acute distress.  CBC unremarkable with hemoglobin near patient's baseline at 11.  BMP with mild hyponatremia of 131, however when corrected for hyperglycemia is 137.     Creatinine of 4 near patient's baseline of 3.8.  BNP  584 mildly elevated from patient's baseline of 470.  Troponin initially elevated to 247.  EKG without STEMI, NSTEMI protocol activated.  Cardiology consulted.  Delta troponin flat at 236.  Still feel patient would benefit from admission to the hospital for likely NSTEMI.  Chest x-ray with some pulmonary edema, will administer Lasix as well.  Care of this patient signed out to oncoming ED provider M. Redwine, PA-C at time of shift change, at this time pending admission for NSTEMI.  Rashon voiced understanding of his medical evaluation and treatment plan.  Each of her questions was answered to her expressed satisfaction.  She is amenable to plan for admission at this time.    This chart was dictated using voice recognition software, Dragon. Despite the best efforts of this provider to proofread and correct errors, errors may still occur which can change documentation meaning.  Final Clinical Impression(s) / ED Diagnoses Final diagnoses:  None    Rx / DC Orders ED Discharge Orders     None        Aura Dials 03/05/21 1621    Lacretia Leigh, MD 03/08/21 [phone]

## 2021-03-05 NOTE — ED Notes (Signed)
Pt reporting mild headache, does not want RN to obtain medication for this complaint at this time. Pt denies chest pain. Call bell in reach.

## 2021-03-05 NOTE — ED Notes (Signed)
Admitting at BS

## 2021-03-05 NOTE — ED Notes (Signed)
EDP at BS 

## 2021-03-05 NOTE — ED Notes (Signed)
Called service response to order meal tray.

## 2021-03-05 NOTE — ED Provider Notes (Signed)
Emergency Medicine Provider Triage Evaluation Note  Susan [name] , a 70 y.o. female  was evaluated in triage.  Pt complains of cough and sob. Went to pcp who did an ekg and told her to go to the ED.  Review of Systems  Positive: Cough, sob, leg swelling Negative: Chest pain  Physical Exam  BP 136/70 (BP Location: Left Arm)   Pulse 81   Temp 98 F (36.7 C) (Oral)   Resp 18   SpO2 98%  Gen:   Awake, no distress   Resp:  Normal effort  MSK:   Moves extremities without difficulty  Other:  Ble edema, left lower lobe rales  Medical Decision Making  Medically screening exam initiated at 12:27 PM.  Appropriate orders placed.  Susan [name] was informed that the remainder of the evaluation will be completed by another provider, this initial triage assessment does not replace that evaluation, and the importance of remaining in the ED until their evaluation is complete.     Rodney Booze, PA-C 03/05/21 1231    Wyvonnia Dusky, MD 03/05/21 [phone]

## 2021-03-06 ENCOUNTER — Encounter (HOSPITAL_COMMUNITY): Payer: Self-pay | Admitting: Internal Medicine

## 2021-03-06 ENCOUNTER — Inpatient Hospital Stay (HOSPITAL_COMMUNITY): Payer: HMO

## 2021-03-06 DIAGNOSIS — E119 Type 2 diabetes mellitus without complications: Secondary | ICD-10-CM

## 2021-03-06 DIAGNOSIS — Z794 Long term (current) use of insulin: Secondary | ICD-10-CM

## 2021-03-06 DIAGNOSIS — I214 Non-ST elevation (NSTEMI) myocardial infarction: Secondary | ICD-10-CM

## 2021-03-06 DIAGNOSIS — I5032 Chronic diastolic (congestive) heart failure: Secondary | ICD-10-CM

## 2021-03-06 LAB — CBC
HCT: 34.4 % — ABNORMAL LOW (ref 36.0–46.0)
Hemoglobin: 10.8 g/dL — ABNORMAL LOW (ref 12.0–15.0)
MCH: 26.7 pg (ref 26.0–34.0)
MCHC: 31.4 g/dL (ref 30.0–36.0)
MCV: 85.1 fL (ref 80.0–100.0)
Platelets: 141 10*3/uL — ABNORMAL LOW (ref 150–400)
RBC: 4.04 MIL/uL (ref 3.87–5.11)
RDW: 15.9 % — ABNORMAL HIGH (ref 11.5–15.5)
WBC: 7.4 10*3/uL (ref 4.0–10.5)
nRBC: 0 % (ref 0.0–0.2)

## 2021-03-06 LAB — LIPID PANEL
Cholesterol: 172 mg/dL (ref 0–200)
HDL: 51 mg/dL (ref >40)
LDL Cholesterol: 91 mg/dL (ref 0–99)
Total CHOL/HDL Ratio: 3.4 RATIO
Triglycerides: 150 mg/dL — ABNORMAL HIGH (ref <150)
VLDL: 30 mg/dL (ref 0–40)

## 2021-03-06 LAB — ECHOCARDIOGRAM COMPLETE
AR max vel: 2.11 cm2
AV Area VTI: 2.07 cm2
AV Area mean vel: 2.13 cm2
AV Mean grad: 5 mmHg
AV Peak grad: 9.4 mmHg
Ao pk vel: 1.53 m/s
Area-P 1/2: 4.83 cm2
Height: 65 in
MV VTI: 2.24 cm2
S' Lateral: 3.6 cm
Weight: 4384 oz

## 2021-03-06 LAB — URINALYSIS, ROUTINE W REFLEX MICROSCOPIC
Bilirubin Urine: NEGATIVE
Glucose, UA: 50 mg/dL — AB
Hgb urine dipstick: NEGATIVE
Ketones, ur: NEGATIVE mg/dL
Nitrite: NEGATIVE
Protein, ur: 30 mg/dL — AB
Specific Gravity, Urine: 1.009 (ref 1.005–1.030)
WBC, UA: >50 WBC/hpf — ABNORMAL HIGH (ref 0–5)
pH: 5 (ref 5.0–8.0)

## 2021-03-06 LAB — BASIC METABOLIC PANEL
Anion gap: 14 (ref 5–15)
Anion gap: 11 (ref 5–15)
BUN: 110 mg/dL — ABNORMAL HIGH (ref 8–23)
BUN: 107 mg/dL — ABNORMAL HIGH (ref 8–23)
CO2: 25 mmol/L (ref 22–32)
CO2: 26 mmol/L (ref 22–32)
Calcium: 9 mg/dL (ref 8.9–10.3)
Calcium: 8.8 mg/dL — ABNORMAL LOW (ref 8.9–10.3)
Chloride: 95 mmol/L — ABNORMAL LOW (ref 98–111)
Chloride: 99 mmol/L (ref 98–111)
Creatinine, Ser: 3.77 mg/dL — ABNORMAL HIGH (ref 0.44–1.00)
Creatinine, Ser: 3.38 mg/dL — ABNORMAL HIGH (ref 0.44–1.00)
GFR, Estimated: 12 mL/min — ABNORMAL LOW (ref >60)
GFR, Estimated: 14 mL/min — ABNORMAL LOW (ref >60)
Glucose, Bld: 275 mg/dL — ABNORMAL HIGH (ref 70–99)
Glucose, Bld: 278 mg/dL — ABNORMAL HIGH (ref 70–99)
Potassium: 2.9 mmol/L — ABNORMAL LOW (ref 3.5–5.1)
Potassium: 4.1 mmol/L (ref 3.5–5.1)
Sodium: 134 mmol/L — ABNORMAL LOW (ref 135–145)
Sodium: 136 mmol/L (ref 135–145)

## 2021-03-06 LAB — CBG MONITORING, ED
Glucose-Capillary: 259 mg/dL — ABNORMAL HIGH (ref 70–99)
Glucose-Capillary: 316 mg/dL — ABNORMAL HIGH (ref 70–99)
Glucose-Capillary: 278 mg/dL — ABNORMAL HIGH (ref 70–99)

## 2021-03-06 LAB — GLUCOSE, CAPILLARY
Glucose-Capillary: 233 mg/dL — ABNORMAL HIGH (ref 70–99)
Glucose-Capillary: 151 mg/dL — ABNORMAL HIGH (ref 70–99)

## 2021-03-06 LAB — HEPARIN LEVEL (UNFRACTIONATED)
Heparin Unfractionated: <0.1 IU/mL — ABNORMAL LOW (ref 0.30–0.70)
Heparin Unfractionated: 0.22 IU/mL — ABNORMAL LOW (ref 0.30–0.70)

## 2021-03-06 MED ORDER — POTASSIUM CHLORIDE CRYS ER 20 MEQ PO TBCR
40.0000 meq | EXTENDED_RELEASE_TABLET | ORAL | Status: AC
  Administered 2021-03-06 (×2): 40 meq via ORAL
  Filled 2021-03-06 (×2): qty 2

## 2021-03-06 MED ORDER — PERFLUTREN LIPID MICROSPHERE
1.0000 mL | INTRAVENOUS | Status: AC | PRN
  Administered 2021-03-06: 3 mL via INTRAVENOUS
  Filled 2021-03-06: qty 10

## 2021-03-06 MED ORDER — FUROSEMIDE 10 MG/ML IJ SOLN
120.0000 mg | Freq: Two times a day (BID) | INTRAVENOUS | Status: DC
  Administered 2021-03-06 – 2021-03-07 (×3): 120 mg via INTRAVENOUS
  Filled 2021-03-06 (×4): qty 12
  Filled 2021-03-06 (×2): qty 10

## 2021-03-06 MED ORDER — HEPARIN SODIUM (PORCINE) 5000 UNIT/ML IJ SOLN
5000.0000 [IU] | Freq: Three times a day (TID) | INTRAMUSCULAR | Status: DC
  Administered 2021-03-06 – 2021-03-10 (×11): 5000 [IU] via SUBCUTANEOUS
  Filled 2021-03-06 (×11): qty 1

## 2021-03-06 MED ORDER — HYDROCORTISONE 0.5 % EX CREA
1.0000 "application " | TOPICAL_CREAM | Freq: Two times a day (BID) | CUTANEOUS | Status: DC
  Administered 2021-03-06 – 2021-03-09 (×6): 1 via TOPICAL
  Filled 2021-03-06: qty 28.35

## 2021-03-06 MED ORDER — INSULIN ASPART 100 UNIT/ML IJ SOLN
8.0000 [IU] | Freq: Three times a day (TID) | INTRAMUSCULAR | Status: DC
  Administered 2021-03-06 – 2021-03-08 (×6): 8 [IU] via SUBCUTANEOUS

## 2021-03-06 MED ORDER — SENNOSIDES-DOCUSATE SODIUM 8.6-50 MG PO TABS
1.0000 | ORAL_TABLET | Freq: Two times a day (BID) | ORAL | Status: DC
  Administered 2021-03-06 – 2021-03-07 (×2): 1 via ORAL
  Filled 2021-03-06 (×4): qty 1

## 2021-03-06 MED ORDER — INSULIN ASPART 100 UNIT/ML IJ SOLN
0.0000 [IU] | Freq: Three times a day (TID) | INTRAMUSCULAR | Status: DC
Start: 2021-03-06 — End: 2021-03-10
  Administered 2021-03-06: 5 [IU] via SUBCUTANEOUS
  Administered 2021-03-07: 2 [IU] via SUBCUTANEOUS
  Administered 2021-03-07 – 2021-03-08 (×3): 3 [IU] via SUBCUTANEOUS
  Administered 2021-03-08: 5 [IU] via SUBCUTANEOUS
  Administered 2021-03-09: 8 [IU] via SUBCUTANEOUS
  Administered 2021-03-09 – 2021-03-10 (×2): 3 [IU] via SUBCUTANEOUS

## 2021-03-06 MED ORDER — POTASSIUM CHLORIDE CRYS ER 20 MEQ PO TBCR
40.0000 meq | EXTENDED_RELEASE_TABLET | Freq: Three times a day (TID) | ORAL | Status: DC
  Administered 2021-03-06: 40 meq via ORAL
  Filled 2021-03-06: qty 2

## 2021-03-06 MED ORDER — INSULIN GLARGINE-YFGN 100 UNIT/ML ~~LOC~~ SOLN
40.0000 [IU] | Freq: Every day | SUBCUTANEOUS | Status: DC
  Administered 2021-03-06 – 2021-03-10 (×5): 40 [IU] via SUBCUTANEOUS
  Filled 2021-03-06 (×6): qty 0.4

## 2021-03-06 MED ORDER — INSULIN ASPART 100 UNIT/ML IJ SOLN
0.0000 [IU] | Freq: Every day | INTRAMUSCULAR | Status: DC
  Administered 2021-03-09: 3 [IU] via SUBCUTANEOUS

## 2021-03-06 MED ORDER — HEPARIN BOLUS VIA INFUSION
2000.0000 [IU] | Freq: Once | INTRAVENOUS | Status: AC
  Administered 2021-03-06: 2000 [IU] via INTRAVENOUS
  Filled 2021-03-06: qty 2000

## 2021-03-06 NOTE — Plan of Care (Signed)
Pt arrived on the unit, A/O x4, RA, purewick changed, VS stable. Insulin pump on Rt upper posterior arm deactivated. Bilateral lower extremities red/discoloration. Rt toes are absent. Bed in low position, alarms are on, call bell in reach. Family is aware of pt in the hospital

## 2021-03-06 NOTE — ED Notes (Signed)
Breakfast order placed ?

## 2021-03-06 NOTE — Progress Notes (Signed)
Nutrition Education Note  RD consulted for nutrition education regarding a Heart Healthy carb modified diet.  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 She cooks for her husband and her disabled son. She does not use salt when cooking anymore. She has an air fryer and would like to learn how to use it.   Lipid Panel     Component Value Date/Time   CHOL 172 03/06/2021 0500   TRIG 150 (H) 03/06/2021 0500   HDL 51 03/06/2021 0500   CHOLHDL 3.4 03/06/2021 0500   VLDL 30 03/06/2021 0500   LDLCALC 91 03/06/2021 0500   LDLCALC 65 01/17/2019 1608    RD provided "Heart Healthy Consistent Carbohydrate Nutrition Therapy" handout from the Academy of [employer] and Dietetics. Reviewed patient's dietary recall. Provided examples on ways to decrease sodium and fat intake in diet. Discouraged intake of processed foods and use of salt shaker. Encouraged fresh fruits and vegetables as well as whole grain sources of carbohydrates to maximize fiber intake. Teach back method used.  Expect fair to good compliance.  Body mass index is 45.6 kg/m. Pt meets criteria for morbid obesity based on current BMI.  Current diet order is Renal Carbohydrate modified, patient is consuming approximately 100% of meals at this time. Labs and medications reviewed. No further nutrition interventions warranted at this time. RD contact information provided. If additional nutrition issues arise, please re-consult RD.  Lucas Mallow, RD, LDN, CNSC Please refer to [facility] for contact information.

## 2021-03-06 NOTE — Progress Notes (Signed)
*  PRELIMINARY RESULTS* Echocardiogram 2D Echocardiogram has been performed.  Susan [name] 03/06/2021, 2:45 PM

## 2021-03-06 NOTE — Progress Notes (Signed)
PROGRESS NOTE    Susan [name]  VPX:[id number] DOB: Apr 29, 1951 DOA: 03/05/2021 PCP: [name], MD    No chief complaint on file.   Brief Narrative:  Susan [name] is a 70 y.o. female with medical history significant of CAD with stenting in 4627, chronic diastolic CHF, CKD stage IV, HTN, HLD, PVD, IDDM on insulin pump, presented with worsening of chest pain and shortness of breath.  Found to be in acute heart failure Also have uncontrolled diabetes And possible UTI    Subjective:  Currently on heparin drip and nitro drip Denies chest pain,  She is on room air at rest Legs are swollen ,  1 L urine output documented since admission, urine is cloudy Last bm a week ago Some rash on legs  Assessment & Plan:   Active Problems:   NSTEMI (non-ST elevated myocardial infarction) (HCC)   Non-STEMI On heparin drip and nitro drip Cardiology following, currently no plan for cardiac cath due to [place] Will follow cardiology recommendation  Acute on chronic diastolic CHF On IV Lasix 1 L urine output documented since admission Appreciate cardiology input, will follow recommendation  Hypokalemia Replace K Check mag  Insulin-dependent diabetes , Type 2 diabetes Uncontrolled, with hyperglycemia A1c 12.2 Seen by endocrinology Dr [name] in 01/2021 insulin pump was adjusted twice in September She and her husband is not sure about pump setting They agree stop insulin pump here and use insulin longactive plus ssi Appreciate diabetes coordinator input  CKD 4 -Nephrology ?Dialysis  History of recurrent UTI On prophylactic Keflex chronically Report urinary symptom, urine appears cloudy Will collect urine  Constipation Continue MiraLAX, add Senokot  On ursodiol chronically   Class III obesity: Body mass index is 45.76 kg/m.Marland Kitchen        Unresulted Labs (From admission, onward)     Start     Ordered   03/07/21 0500  Heparin level (unfractionated)  Daily,   R       03/05/21 1545   03/06/21 1000  Heparin level (unfractionated)  Once-Timed,   TIMED        03/06/21 0119   03/06/21 0500  CBC  Daily,   R      03/05/21 1545   03/06/21 0350  Basic metabolic panel  Daily,   R      03/05/21 1712   03/06/21 0938  Basic metabolic panel  Daily,   R      03/05/21 1802              DVT prophylaxis:   Currently on heparin drip   Code Status: Full Family Communication: Patient Disposition:   Status is: Inpatient   Dispo: The patient is from: Home              Anticipated d/c is to: To be determined, report walks with a walker for short distance at baseline              Anticipated d/c date is: To be determined, need cardiology clearance                Consultants:  Cardiology/heart failure team  Procedures:  None  Antimicrobials:   Anti-infectives (From admission, onward)    Start     Dose/Rate Route Frequency Ordered Stop   03/06/21 1000  cephALEXin (KEFLEX) capsule 250 mg        250 mg Oral Daily 03/05/21 1802             Objective: Vitals:  03/06/21 0535 03/06/21 0630 03/06/21 0700 03/06/21 0730  BP: (!) 116/52 (!) 118/56 117/67 126/61  Pulse: 86 84 84 85  Resp: 12 12 11 10   Temp:      TempSrc:      SpO2: 91% 93% 92% 97%  Weight:      Height:        Intake/Output Summary (Last 24 hours) at 03/06/2021 0900 Last data filed at 03/06/2021 0806 Gross per 24 hour  Intake 191.51 ml  Output 1000 ml  Net -808.49 ml   Filed Weights   03/05/21 1215  Weight: 124.7 kg    Examination:  General exam: Obese , weak , chronically ill appearing ,alert, awake, communicative,calm, NAD Respiratory system:  diminished at bases, no wheezing, respiratory effort normal. Cardiovascular system:  RRR.  Gastrointestinal system: Abdomen is nondistended, soft and nontender.  Normal bowel sounds heard. Central nervous system: Alert and oriented. No focal neurological deficits. Extremities:  +edema Skin: No rashes, lesions or  ulcers Psychiatry: Judgement and insight appear normal. Mood & affect appropriate.     Data Reviewed: I have personally reviewed following labs and imaging studies  CBC: Recent Labs  Lab 03/05/21 1236 03/06/21 0500  WBC 9.9 7.4  HGB 11.4* 10.8*  HCT 36.2 34.4*  MCV 85.8 85.1  PLT 159 141*    Basic Metabolic Panel: Recent Labs  Lab 03/05/21 1236 03/06/21 0500  NA 131* 134*  K 3.7 2.9*  CL 93* 95*  CO2 25 25  GLUCOSE 339* 275*  BUN 109* 110*  CREATININE 4.01* 3.77*  CALCIUM 8.9 9.0    GFR: Estimated Creatinine Clearance: 18.2 mL/min (A) (by C-G formula based on SCr of 3.77 mg/dL (H)).  Liver Function Tests: No results for input(s): AST, ALT, ALKPHOS, BILITOT, PROT, ALBUMIN in the last 168 hours.  CBG: Recent Labs  Lab 03/05/21 2211 03/06/21 0434 03/06/21 0838  GLUCAP 279* 259* 316*     Recent Results (from the past 240 hour(s))  Resp Panel by RT-PCR (Flu A&B, Covid) Nasopharyngeal Swab     Status: None   Collection Time: 03/05/21 12:29 PM   Specimen: Nasopharyngeal Swab; Nasopharyngeal(NP) swabs in vial transport medium  Result Value Ref Range Status   SARS Coronavirus 2 by RT PCR NEGATIVE NEGATIVE Final    Comment: (NOTE) SARS-CoV-2 target nucleic acids are NOT DETECTED.  The SARS-CoV-2 RNA is generally detectable in upper respiratory specimens during the acute phase of infection. The lowest concentration of SARS-CoV-2 viral copies this assay can detect is 138 copies/mL. A negative result does not preclude SARS-Cov-2 infection and should not be used as the sole basis for treatment or other patient management decisions. A negative result may occur with  improper specimen collection/handling, submission of specimen other than nasopharyngeal swab, presence of viral mutation(s) within the areas targeted by this assay, and inadequate number of viral copies(<138 copies/mL). A negative result must be combined with clinical observations, patient history,  and epidemiological information. The expected result is Negative.  Fact Sheet for Patients:  EntrepreneurPulse.com.au  Fact Sheet for Healthcare Providers:  IncredibleEmployment.be  This test is no t yet approved or cleared by the [place] FDA and  has been authorized for detection and/or diagnosis of SARS-CoV-2 by FDA under an Emergency Use Authorization (EUA). This EUA will remain  in effect (meaning this test can be used) for the duration of the COVID-19 declaration under Section 564(b)(1) of the Act, 21 U.S.C.section 360bbb-3(b)(1), unless the authorization is terminated  or revoked sooner.  Influenza A by PCR NEGATIVE NEGATIVE Final   Influenza B by PCR NEGATIVE NEGATIVE Final    Comment: (NOTE) The Xpert Xpress SARS-CoV-2/FLU/RSV plus assay is intended as an aid in the diagnosis of influenza from Nasopharyngeal swab specimens and should not be used as a sole basis for treatment. Nasal washings and aspirates are unacceptable for Xpert Xpress SARS-CoV-2/FLU/RSV testing.  Fact Sheet for Patients: EntrepreneurPulse.com.au  Fact Sheet for Healthcare Providers: IncredibleEmployment.be  This test is not yet approved or cleared by the [place] FDA and has been authorized for detection and/or diagnosis of SARS-CoV-2 by FDA under an Emergency Use Authorization (EUA). This EUA will remain in effect (meaning this test can be used) for the duration of the COVID-19 declaration under Section 564(b)(1) of the Act, 21 U.S.C. section 360bbb-3(b)(1), unless the authorization is terminated or revoked.  Performed at [facility], [place] [address], [place], [place] [address]          Radiology Studies: DG Chest 2 View  Result Date: 03/05/2021 CLINICAL DATA:  Cough, shortness of breath EXAM: CHEST - 2 VIEW COMPARISON:  Chest radiograph 08/24/2020 FINDINGS: The cardiomediastinal silhouette is  within normal limits. There are increased interstitial markings throughout both lungs. There are trace bilateral pleural effusions. There is no pneumothorax. There is no acute osseous abnormality.  Sh IMPRESSION: Suspect mild pulmonary interstitial edema with trace pleural effusions. Electronically Signed   By: Valetta Mole M.D.   On: 03/05/2021 13:17        Scheduled Meds:  allopurinol  100 mg Oral BID   aspirin EC  81 mg Oral Q breakfast   atorvastatin  80 mg Oral Daily   carvedilol  12.5 mg Oral BID WC   cephALEXin  250 mg Oral Daily   clopidogrel  75 mg Oral Q breakfast   ferrous sulfate  325 mg Oral BID WC   FLUoxetine  20 mg Oral Daily   insulin aspart  0-9 Units Subcutaneous TID WC   insulin pump   Subcutaneous TID WC, HS, 0200   levothyroxine  50 mcg Oral QAC breakfast   linagliptin  5 mg Oral Daily   magnesium oxide  400 mg Oral Daily   polyethylene glycol  17 g Oral Daily   potassium chloride  40 mEq Oral Q8H   pregabalin  150 mg Oral BID   sodium chloride flush  3 mL Intravenous Q12H   ursodiol  300 mg Oral TID   Continuous Infusions:  sodium chloride     furosemide     heparin 1,400 Units/hr (03/06/21 0808)   nitroGLYCERIN 5 mcg/min (03/06/21 0411)     LOS: 1 day   Time spent: 81mins Greater than 50% of this time was spent in counseling, explanation of diagnosis, planning of further management, and coordination of care.   Voice Recognition Viviann Spare dictation system was used to create this note, attempts have been made to correct errors. Please contact the author with questions and/or clarifications.   Florencia Reasons, MD PhD FACP [facility]  Available via Epic secure chat 7am-7pm for nonurgent issues Please page for urgent issues To page the attending provider between 7A-7P or the covering provider during after hours 7P-7A, please log into the web site [email or web address] and access using universal [place] password for that web site. If you do not have the  password, please call the hospital operator.    03/06/2021, 9:00 AM

## 2021-03-06 NOTE — Progress Notes (Signed)
[place] for heparin  Indication: chest pain/ACS  Allergies  Allergen Reactions   Drug Ingredient [Cephalexin] Diarrhea    Pt reports heavy diarrhea with use of keflex   Codeine Nausea And Vomiting    Patient Measurements: Height: 5\' 5"  (165.1 cm) Weight: 124.7 kg (275 lb) IBW/kg (Calculated) : 57 Heparin Dosing Weight: 87.3 kg  Vital Signs: BP: 128/57 (10/12 0100) Pulse Rate: 87 (10/12 0100)  Labs: Recent Labs    03/05/21 1236 03/05/21 1430 03/05/21 1545 03/05/21 1805 03/05/21 2003 03/06/21 0000  HGB 11.4*  --   --   --   --   --   HCT 36.2  --   --   --   --   --   PLT 159  --   --   --   --   --   LABPROT  --   --  12.1  --   --   --   INR  --   --  0.9  --   --   --   HEPARINUNFRC  --   --   --   --   --  <0.10*  CREATININE 4.01*  --   --   --   --   --   TROPONINIHS 247* 236*  --  182* 184*  --      Estimated Creatinine Clearance: 17.1 mL/min (A) (by C-G formula based on SCr of 4.01 mg/dL (H)).  Assessment: 69 y.o. female with NSTEMI for heparin   Goal of Therapy:  Heparin level 0.3-0.7 units/ml Monitor platelets by anticoagulation protocol: Yes   Plan:  Heparin 2000 units IV bolus, then increase heparin  1400 units/hr Check heparin level in 8 hours.   Susan [name], Bronson Curb 03/06/2021,1:17 AM

## 2021-03-06 NOTE — Progress Notes (Signed)
ANTICOAGULATION CONSULT NOTE Pharmacy Consult for heparin  Indication: chest pain/ACS  Allergies  Allergen Reactions   Drug Ingredient [Cephalexin] Diarrhea    Pt reports heavy diarrhea with use of keflex   Codeine Nausea And Vomiting    Patient Measurements: Height: 5\' 5"  (165.1 cm) Weight: 124.3 kg (274 lb) IBW/kg (Calculated) : 57 Heparin Dosing Weight: 87.3 kg  Vital Signs: Temp: 98.1 F (36.7 C) (10/12 1316) Temp Source: Oral (10/12 1316) BP: 132/57 (10/12 1316) Pulse Rate: 84 (10/12 1316)  Labs: Recent Labs    03/05/21 1236 03/05/21 1430 03/05/21 1545 03/05/21 1805 03/05/21 2003 03/06/21 0000 03/06/21 0500 03/06/21 1006  HGB 11.4*  --   --   --   --   --  10.8*  --   HCT 36.2  --   --   --   --   --  34.4*  --   PLT 159  --   --   --   --   --  141*  --   LABPROT  --   --  12.1  --   --   --   --   --   INR  --   --  0.9  --   --   --   --   --   HEPARINUNFRC  --   --   --   --   --  <0.10*  --  0.22*  CREATININE 4.01*  --   --   --   --   --  3.77*  --   TROPONINIHS 247* 236*  --  182* 184*  --   --   --      Estimated Creatinine Clearance: 18.1 mL/min (A) (by C-G formula based on SCr of 3.77 mg/dL (H)).  Assessment: 70 y.o. female with NSTEMI currently on heparin.   Morning heparin level below goal on 1400 units/hr. No bleeding or IV issues noted.   Goal of Therapy:  Heparin level 0.3-0.7 units/ml Monitor platelets by anticoagulation protocol: Yes   Plan:  Increase heparin to 1600 units/hr Follow up heparin level tonight Follow up cardiac plan  Erin Hearing PharmD., BCPS Clinical Pharmacist 03/06/2021 2:11 PM

## 2021-03-06 NOTE — Progress Notes (Addendum)
Advanced Heart Failure Rounding Note  PCP-Cardiologist: Glori Bickers, MD    Patient Profile   Susan [name] is a 70 y.o. female with history CAD s/p MI 2000 s/p BMS to RCA and 2007 s/p another BMS to RCA, RBBB, chronic diastolic HF, stage IV CKD. Now admitted with CP with elevated HS troponin, A/C diastolic HF and AKI.  Subjective:    Feels somewhat better but still short of breath sitting up in bed. No CP. Some reproducible pain with palpation between shoulder blades.  Voided 1450 cc since last night, another 250 cc not yet charted. Intake not recorded.    Objective:   Weight Range: 124.7 kg Body mass index is 45.76 kg/m.   Vital Signs:   Temp:  [98 F (36.7 C)] 98 F (36.7 C) (10/11 1215) Pulse Rate:  [70-87] 85 (10/12 0730) Resp:  [10-22] 10 (10/12 0730) BP: (100-181)/(44-151) 126/61 (10/12 0730) SpO2:  [90 %-98 %] 97 % (10/12 0730) Weight:  [124.7 kg] 124.7 kg (10/11 1215)    Weight change: Filed Weights   03/05/21 1215  Weight: 124.7 kg    Intake/Output:   Intake/Output Summary (Last 24 hours) at 03/06/2021 0908 Last data filed at 03/06/2021 0806 Gross per 24 hour  Intake 191.51 ml  Output 1000 ml  Net -808.49 ml      Physical Exam    General: Chronically ill appearing. No acute distress. Sitting up in bed HEENT: Normal Neck: JVP to jaw. Carotids 2+ bilat; no bruits. No lymphadenopathy or thyromegaly appreciated. Cor: PMI nondisplaced. Regular rate & rhythm. No rubs, gallops or murmurs. Lungs: Diminished. Reproducible pain with palpation between shoulder blades Abdomen: Soft, nontender, distended. No hepatosplenomegaly. No bruits or masses. Good bowel sounds. Extremities: No cyanosis, clubbing, rash, 2-3+ to thighs Neuro: Alert & orientedx3, cranial nerves grossly intact. moves all 4 extremities w/o difficulty. Affect pleasant   Telemetry   SR 70s-80s  EKG    SR first degree AV block, inferior and lateral q waves (no changes from  yesterday's)  Labs    CBC Recent Labs    03/05/21 1236 03/06/21 0500  WBC 9.9 7.4  HGB 11.4* 10.8*  HCT 36.2 34.4*  MCV 85.8 85.1  PLT 159 025*   Basic Metabolic Panel Recent Labs    03/05/21 1236 03/06/21 0500  NA 131* 134*  K 3.7 2.9*  CL 93* 95*  CO2 25 25  GLUCOSE 339* 275*  BUN 109* 110*  CREATININE 4.01* 3.77*  CALCIUM 8.9 9.0   Liver Function Tests No results for input(s): AST, ALT, ALKPHOS, BILITOT, PROT, ALBUMIN in the last 72 hours. No results for input(s): LIPASE, AMYLASE in the last 72 hours. Cardiac Enzymes No results for input(s): CKTOTAL, CKMB, CKMBINDEX, TROPONINI in the last 72 hours.  BNP: BNP (last 3 results) Recent Labs    08/21/20 2307 11/08/20 1433 03/05/21 1236  BNP 215.9* 443.2* 584.6*    ProBNP (last 3 results) No results for input(s): PROBNP in the last 8760 hours.   D-Dimer No results for input(s): DDIMER in the last 72 hours. Hemoglobin A1C Recent Labs    03/05/21 1734  HGBA1C 12.2*   Fasting Lipid Panel Recent Labs    03/06/21 0500  CHOL 172  HDL 51  LDLCALC 91  TRIG 150*  CHOLHDL 3.4   Thyroid Function Tests No results for input(s): TSH, T4TOTAL, T3FREE, THYROIDAB in the last 72 hours.  Invalid input(s): FREET3  Other results:   Imaging    DG Chest 2 View  Result Date: 03/05/2021 CLINICAL DATA:  Cough, shortness of breath EXAM: CHEST - 2 VIEW COMPARISON:  Chest radiograph 08/24/2020 FINDINGS: The cardiomediastinal silhouette is within normal limits. There are increased interstitial markings throughout both lungs. There are trace bilateral pleural effusions. There is no pneumothorax. There is no acute osseous abnormality.  Sh IMPRESSION: Suspect mild pulmonary interstitial edema with trace pleural effusions. Electronically Signed   By: Valetta Mole M.D.   On: 03/05/2021 13:17     Medications:     Scheduled Medications:  allopurinol  100 mg Oral BID   aspirin EC  81 mg Oral Q breakfast   atorvastatin   80 mg Oral Daily   carvedilol  12.5 mg Oral BID WC   cephALEXin  250 mg Oral Daily   clopidogrel  75 mg Oral Q breakfast   ferrous sulfate  325 mg Oral BID WC   FLUoxetine  20 mg Oral Daily   insulin aspart  0-9 Units Subcutaneous TID WC   insulin pump   Subcutaneous TID WC, HS, 0200   levothyroxine  50 mcg Oral QAC breakfast   linagliptin  5 mg Oral Daily   magnesium oxide  400 mg Oral Daily   polyethylene glycol  17 g Oral Daily   potassium chloride  40 mEq Oral Q8H   pregabalin  150 mg Oral BID   sodium chloride flush  3 mL Intravenous Q12H   ursodiol  300 mg Oral TID    Infusions:  sodium chloride     furosemide 120 mg (03/06/21 0907)   heparin 1,400 Units/hr (03/06/21 0808)   nitroGLYCERIN 5 mcg/min (03/06/21 0411)    PRN Medications: sodium chloride, acetaminophen, albuterol, sodium chloride flush     Assessment/Plan  Chest Pain w/ Elevated HS Trop/ CAD  - known CAD w/ h/o remote MI in 2000 and 2007 w/ BMS to RCA - mixed typical and atypical symptoms  - HS trop mildly elevated w/ flat trend, 247>>236 >>182>184. Does not suggest an acute event but possible she had event when symptoms first occurred over the weekend. Has new inferior and anterolateral Q waves on ECG that were not present 6 months ago. Troponin elevation also potentially just due to volume overload in setting of AKI.  - CXR w/ mild pulmonary edema - Would avoid cath given baseline CKD and risk for worsening renal failure - Continues on IV heparin, plavix and aspirin.  - beta blocker - high-intensity statin - Diuresing w/ IV Lasix 120 mg BID.  - Update 2D Echo - Need to continue nitro gtt? BP improved and no residual CP. Some pain between shoulder blades but wonder if musculoskeletal. Tender with palpation   2. Acute on Chronic Diastolic CHF - Last echo 3/41 EF 55-60%, RV normal  - CXR w/ mild pulmonary vascular congestion  - increased dyspnea, orthopnea and dry nocturnal cough x 4 days  -  Diuresing with IV lasix 120 mg BID. Has put out about 1.6L since last night, just got 2nd dose lasix. Intake not recorded.  - Significantly volume overloaded. Depending on diuresis today, may need to add metolazone or initiate lasix gtt - Noncompliant with medical management. Not taking home diuretics prior to admission. Admits she has not been watching her diet at all, eating fried chicken and fried pork chops. Will consult dietician - Repeat echo pending   3. AKI on Stage IV CKD - Baseline sCr ~3.5 - Scr 4.01 . 3.77. BUN > 100 - followed by outpatient nephrology  -  follow closely   4. Type 2DM - poorly controlled, last hgb A1c 10.3 - management per primary team   5. Hypokalemia - K 2.9. Replace aggressively  6. Hyponatremia, likely hypervolemic -Na 131 > 134 -Monitor  Previously enrolled in paramedicine. Had graduated from program in July 2022. May need to consider referring her back to the program after discharge.   Length of Stay: 1  Jury Caserta N, PA-C  03/06/2021, 9:08 AM  Advanced Heart Failure Team Pager [phone] (M-F; 7a - 5p)  Please contact [facility] for night-coverage after hours (5p -7a ) and weekends on [email or web address]   Patient seen and examined with the above-signed Advanced Practice Provider and/or Housestaff. I personally reviewed laboratory data, imaging studies and relevant notes. I independently examined the patient and formulated the important aspects of the plan. I have edited the note to reflect any of my changes or salient points. I have personally discussed the plan with the patient and/or family.  Feeling better. Diuresing well on high-dose IV lasix. SCr stable. I reviewed bedside echo. EF preserved. K low  General:  Lying in bed  No resp difficulty HEENT: normal Neck: supple. JVP to jaw . Carotids 2+ bilat; no bruits. No lymphadenopathy or thryomegaly appreciated. Cor: PMI nondisplaced. Regular rate & rhythm. No rubs, gallops or murmurs. Lungs:  clear Abdomen: obese soft, nontender, nondistended. No hepatosplenomegaly. No bruits or masses. Good bowel sounds. Extremities: no cyanosis, clubbing, rash, 1-2+ edema Neuro: alert & orientedx3, cranial nerves grossly intact. moves all 4 extremities w/o difficulty. Affect pleasant  She remains volume overloaded. Continue IV lasix. Echo stable. Supp K. Kidney function stable. We discussed the fact that she will likely need HD in the next 6-12 months.   Glori Bickers, MD  3:25 PM

## 2021-03-06 NOTE — Progress Notes (Signed)
Heart Failure Navigator Progress Note  Assessed for Heart & Vascular TOC clinic readiness.  Patient does not meet criteria due to established AHF clinic prior to admission.   Navigator available for reassessment of patient.   Pricilla Holm, MSN, RN Heart Failure Nurse Navigator [phone]

## 2021-03-06 NOTE — Progress Notes (Signed)
Inpatient Diabetes Program Recommendations  AACE/ADA: New Consensus Statement on Inpatient Glycemic Control (2015)  Target Ranges:  Prepandial:   less than 140 mg/dL      Peak postprandial:   less than 180 mg/dL (1-2 hours)      Critically ill patients:  140 - 180 mg/dL   Lab Results  Component Value Date   GLUCAP 316 (H) 03/06/2021   HGBA1C 12.2 (H) 03/05/2021    Review of Glycemic Control  Diabetes history: DM type 2 Outpatient Diabetes medications: Omnipod insulin pump, NPH 15 units Daily if glucose high with pump >300, Novolin Regular insulin in pump Current orders for Inpatient glycemic control:  Novolog 0-9 units tid Insulin pump order set  Inpatient Diabetes Program Recommendations:    Pt has insulin pump turned off with control device in car. Pt wants to be off insulin pump for now. Does not know her insulin pump settings.  - Start Semglee 40 units (0.3 units/kg) -  Increase Novolog Correction to 0-15 units tid + hs -  Add Novolog 8 units tid meal coverage  Spoke with pt at bedside. Omnipod on right upper arm however pt to remove and throw away pod. Pt reports being to Dr. Almetta Lovely office twice in September for pump adjustments due to glucose trends in the 500-600 range. Pt surprised at current A1c level of 12.2%. Pt needs to work closely with Dr. [name] for continued adjustments. Stressed importance of glucose control.  Paged Dr. Erlinda Hong for recommendations as only Novolog correction ordered and glucose trends are already 316.  Will follow glucose trends while here.  Thanks,  Tama Headings RN, MSN, BC-ADM Inpatient Diabetes Coordinator Team Pager [phone] (8a-5p)

## 2021-03-07 DIAGNOSIS — I5021 Acute systolic (congestive) heart failure: Secondary | ICD-10-CM

## 2021-03-07 LAB — BASIC METABOLIC PANEL
Anion gap: 11 (ref 5–15)
BUN: 107 mg/dL — ABNORMAL HIGH (ref 8–23)
CO2: 26 mmol/L (ref 22–32)
Calcium: 9.3 mg/dL (ref 8.9–10.3)
Chloride: 102 mmol/L (ref 98–111)
Creatinine, Ser: 3.25 mg/dL — ABNORMAL HIGH (ref 0.44–1.00)
GFR, Estimated: 15 mL/min — ABNORMAL LOW (ref >60)
Glucose, Bld: 134 mg/dL — ABNORMAL HIGH (ref 70–99)
Potassium: 3.9 mmol/L (ref 3.5–5.1)
Sodium: 139 mmol/L (ref 135–145)

## 2021-03-07 LAB — URINALYSIS, ROUTINE W REFLEX MICROSCOPIC
Bilirubin Urine: NEGATIVE
Glucose, UA: NEGATIVE
Hgb urine dipstick: NEGATIVE
Hyaline Cast: NONE SEEN /LPF
Ketones, ur: NEGATIVE
Nitrite: NEGATIVE
RBC / HPF: NONE SEEN /HPF (ref 0–2)
Specific Gravity, Urine: 1.016 (ref 1.001–1.035)
pH: <=5 (ref 5.0–8.0)

## 2021-03-07 LAB — CBC
HCT: 36.1 % (ref 36.0–46.0)
Hemoglobin: 11.4 g/dL — ABNORMAL LOW (ref 12.0–15.0)
MCH: 26.8 pg (ref 26.0–34.0)
MCHC: 31.6 g/dL (ref 30.0–36.0)
MCV: 84.7 fL (ref 80.0–100.0)
Platelets: 158 10*3/uL (ref 150–400)
RBC: 4.26 MIL/uL (ref 3.87–5.11)
RDW: 16.4 % — ABNORMAL HIGH (ref 11.5–15.5)
WBC: 7.5 10*3/uL (ref 4.0–10.5)
nRBC: 0 % (ref 0.0–0.2)

## 2021-03-07 LAB — GLUCOSE, CAPILLARY
Glucose-Capillary: 147 mg/dL — ABNORMAL HIGH (ref 70–99)
Glucose-Capillary: 118 mg/dL — ABNORMAL HIGH (ref 70–99)
Glucose-Capillary: 186 mg/dL — ABNORMAL HIGH (ref 70–99)
Glucose-Capillary: 139 mg/dL — ABNORMAL HIGH (ref 70–99)

## 2021-03-07 LAB — MAGNESIUM: Magnesium: 2.6 mg/dL — ABNORMAL HIGH (ref 1.7–2.4)

## 2021-03-07 LAB — MICROSCOPIC MESSAGE

## 2021-03-07 MED ORDER — POTASSIUM CHLORIDE CRYS ER 20 MEQ PO TBCR: 40.0000 meq | EXTENDED_RELEASE_TABLET | Freq: Once | ORAL | Status: DC

## 2021-03-07 MED ORDER — SODIUM CHLORIDE 0.9 % IV SOLN
1.0000 g | INTRAVENOUS | Status: DC
  Administered 2021-03-07 – 2021-03-09 (×3): 1 g via INTRAVENOUS
  Filled 2021-03-07 (×3): qty 10

## 2021-03-07 NOTE — Progress Notes (Addendum)
Advanced Heart Failure Rounding Note  PCP-Cardiologist: Glori Bickers, MD    Patient Profile   Susan [name] is a 70 y.o. female with history CAD s/p MI 2000 s/p BMS to RCA and 2007 s/p another BMS to RCA, RBBB, chronic diastolic HF, stage IV CKD. Now admitted with CP with elevated HS troponin, A/C diastolic HF and AKI.  Subjective:   Yesterday diuresed with 120 mg IV lasix twice a day. Negative 1.5 liters. Weight down 1 pound.   Denies chest pain. Denies SOB.    Objective:   Weight Range: 124.3 kg Body mass index is 45.6 kg/m.   Vital Signs:   Temp:  [98.1 F (36.7 C)-98.3 F (36.8 C)] 98.3 F (36.8 C) (10/13 0521) Pulse Rate:  [80-86] 86 (10/13 0521) Resp:  [11-24] 18 (10/13 0521) BP: (107-141)/(48-91) 141/48 (10/13 0521) SpO2:  [93 %-96 %] 95 % (10/13 0521) Weight:  [124.3 kg] 124.3 kg (10/12 1316) Last BM Date: 03/06/21  Weight change: Filed Weights   03/05/21 1215 03/06/21 1316  Weight: 124.7 kg 124.3 kg    Intake/Output:   Intake/Output Summary (Last 24 hours) at 03/07/2021 0857 Last data filed at 03/07/2021 0500 Gross per 24 hour  Intake 978.04 ml  Output 2750 ml  Net -1771.96 ml      Physical Exam    General:  Sits in the chair. No resp difficulty HEENT: normal Neck: supple. JVP difficult to assess.  Carotids 2+ bilat; no bruits. No lymphadenopathy or thryomegaly appreciated. Cor: PMI nondisplaced. Regular rate & rhythm. No rubs, gallops or murmurs. Lungs: clear Abdomen: obese, soft, nontender, nondistended. No hepatosplenomegaly. No bruits or masses. Good bowel sounds. Extremities: no cyanosis, clubbing, rash, edema. Compression socks on.  Neuro: alert & orientedx3, cranial nerves grossly intact. moves all 4 extremities w/o difficulty. Affect pleasant  Telemetry  SR 70-80s   EKG    SR first degree AV block, inferior and lateral q waves (no changes from yesterday's)  Labs    CBC Recent Labs    03/06/21 0500 03/07/21 0326  WBC 7.4  7.5  HGB 10.8* 11.4*  HCT 34.4* 36.1  MCV 85.1 84.7  PLT 141* 347   Basic Metabolic Panel Recent Labs    03/06/21 1808 03/07/21 0326  NA 136 139  K 4.1 3.9  CL 99 102  CO2 26 26  GLUCOSE 278* 134*  BUN 107* 107*  CREATININE 3.38* 3.25*  CALCIUM 8.8* 9.3  MG  --  2.6*   Liver Function Tests No results for input(s): AST, ALT, ALKPHOS, BILITOT, PROT, ALBUMIN in the last 72 hours. No results for input(s): LIPASE, AMYLASE in the last 72 hours. Cardiac Enzymes No results for input(s): CKTOTAL, CKMB, CKMBINDEX, TROPONINI in the last 72 hours.  BNP: BNP (last 3 results) Recent Labs    08/21/20 2307 11/08/20 1433 03/05/21 1236  BNP 215.9* 443.2* 584.6*    ProBNP (last 3 results) No results for input(s): PROBNP in the last 8760 hours.   D-Dimer No results for input(s): DDIMER in the last 72 hours. Hemoglobin A1C Recent Labs    03/05/21 1734  HGBA1C 12.2*   Fasting Lipid Panel Recent Labs    03/06/21 0500  CHOL 172  HDL 51  LDLCALC 91  TRIG 150*  CHOLHDL 3.4   Thyroid Function Tests No results for input(s): TSH, T4TOTAL, T3FREE, THYROIDAB in the last 72 hours.  Invalid input(s): FREET3  Other results:   Imaging    ECHOCARDIOGRAM COMPLETE  Result Date: 03/06/2021  ECHOCARDIOGRAM REPORT   Patient Name:   Susan [name] Date of Exam: 03/06/2021 Medical Rec #:  [id number]    Height:       65.0 in Accession #:    [id number]   Weight:       274.0 lb Date of Birth:  1951-05-06    BSA:          2.261 m Patient Age:    43 years     BP:           126/69 mmHg Patient Gender: F            HR:           92 bpm. Exam Location:  Inpatient Procedure: 2D Echo Indications:    NSTEMI  History:        Patient has prior history of Echocardiogram examinations. CHF;                 Acute MI.  Sonographer:    DBB Referring Phys: [place]  1. Diffuse hypokinesis mid and basal inferior wall akinesis . Left ventricular ejection fraction, by estimation, is  30 to 35%. The left ventricle has moderately decreased function. The left ventricle demonstrates regional wall motion abnormalities (see scoring diagram/findings for description). The left ventricular internal cavity size was mildly dilated. Left ventricular diastolic parameters are consistent with Grade II diastolic dysfunction (pseudonormalization). Elevated left ventricular end-diastolic pressure.  2. Right ventricular systolic function is normal. The right ventricular size is normal.  3. Left atrial size was severely dilated.  4. Right atrial size was moderately dilated.  5. The mitral valve is abnormal. Trivial mitral valve regurgitation. No evidence of mitral stenosis. Moderate mitral annular calcification.  6. The aortic valve is tricuspid. Aortic valve regurgitation is not visualized. Mild to moderate aortic valve sclerosis/calcification is present, without any evidence of aortic stenosis.  7. The inferior vena cava is normal in size with greater than 50% respiratory variability, suggesting right atrial pressure of 3 mmHg. FINDINGS  Left Ventricle: Diffuse hypokinesis mid and basal inferior wall akinesis. Left ventricular ejection fraction, by estimation, is 30 to 35%. The left ventricle has moderately decreased function. The left ventricle demonstrates regional wall motion abnormalities. The left ventricular internal cavity size was mildly dilated. There is no left ventricular hypertrophy. Left ventricular diastolic parameters are consistent with Grade II diastolic dysfunction (pseudonormalization). Elevated left ventricular end-diastolic pressure. Right Ventricle: The right ventricular size is normal. No increase in right ventricular wall thickness. Right ventricular systolic function is normal. Left Atrium: Left atrial size was severely dilated. Right Atrium: Right atrial size was moderately dilated. Pericardium: There is no evidence of pericardial effusion. Mitral Valve: The mitral valve is abnormal.  There is mild thickening of the mitral valve leaflet(s). There is mild calcification of the mitral valve leaflet(s). Moderate mitral annular calcification. Trivial mitral valve regurgitation. No evidence of mitral valve stenosis. MV peak gradient, 7.6 mmHg. The mean mitral valve gradient is 4.0 mmHg. Tricuspid Valve: The tricuspid valve is normal in structure. Tricuspid valve regurgitation is mild . No evidence of tricuspid stenosis. Aortic Valve: The aortic valve is tricuspid. Aortic valve regurgitation is not visualized. Mild to moderate aortic valve sclerosis/calcification is present, without any evidence of aortic stenosis. Aortic valve mean gradient measures 5.0 mmHg. Aortic valve peak gradient measures 9.4 mmHg. Aortic valve area, by VTI measures 2.07 cm. Pulmonic Valve: The pulmonic valve was normal in structure. Pulmonic valve regurgitation is not visualized. No evidence  of pulmonic stenosis. Aorta: The aortic root is normal in size and structure. Venous: The inferior vena cava is normal in size with greater than 50% respiratory variability, suggesting right atrial pressure of 3 mmHg. IAS/Shunts: No atrial level shunt detected by color flow Doppler.  LEFT VENTRICLE PLAX 2D LVIDd:         5.50 cm   Diastology LVIDs:         3.60 cm   LV e' medial:    3.78 cm/s LV PW:         1.10 cm   LV E/e' medial:  32.3 LV IVS:        1.00 cm   LV e' lateral:   6.73 cm/s LVOT diam:     1.90 cm   LV E/e' lateral: 18.1 LV SV:         75 LV SV Index:   33 LVOT Area:     2.84 cm  RIGHT VENTRICLE RV Basal diam:  4.00 cm RV Mid diam:    3.60 cm RV S prime:     12.60 cm/s TAPSE (M-mode): 3.1 cm LEFT ATRIUM             Index        RIGHT ATRIUM           Index LA diam:        5.00 cm 2.21 cm/m   RA Area:     20.60 cm LA Vol (A2C):   65.8 ml 29.10 ml/m  RA Volume:   58.50 ml  25.87 ml/m LA Vol (A4C):   83.7 ml 37.01 ml/m LA Biplane Vol: 79.6 ml 35.20 ml/m  AORTIC VALVE                     PULMONIC VALVE AV Area (Vmax):     2.11 cm      PV Vmax:       0.94 m/s AV Area (Vmean):   2.13 cm      PV Peak grad:  3.6 mmHg AV Area (VTI):     2.07 cm AV Vmax:           153.00 cm/s AV Vmean:          110.000 cm/s AV VTI:            0.365 m AV Peak Grad:      9.4 mmHg AV Mean Grad:      5.0 mmHg LVOT Vmax:         114.00 cm/s LVOT Vmean:        82.500 cm/s LVOT VTI:          0.266 m LVOT/AV VTI ratio: 0.73 MITRAL VALVE                TRICUSPID VALVE MV Area (PHT): 4.83 cm     TR Peak grad:   37.2 mmHg MV Area VTI:   2.24 cm     TR Vmax:        305.00 cm/s MV Peak grad:  7.6 mmHg MV Mean grad:  4.0 mmHg     SHUNTS MV Vmax:       1.38 m/s     Systemic VTI:  0.27 m MV Vmean:      93.2 cm/s    Systemic Diam: 1.90 cm MV Decel Time: 157 msec MV E velocity: 122.00 cm/s MV A velocity: 84.40 cm/s MV E/A ratio:  1.45 Jenkins Rouge MD Electronically signed by Collier Salina  Nishan MD Signature Date/Time: 03/06/2021/5:48:19 PM    Final      Medications:     Scheduled Medications:  allopurinol  100 mg Oral BID   aspirin EC  81 mg Oral Q breakfast   atorvastatin  80 mg Oral Daily   carvedilol  12.5 mg Oral BID WC   cephALEXin  250 mg Oral Daily   clopidogrel  75 mg Oral Q breakfast   ferrous sulfate  325 mg Oral BID WC   FLUoxetine  20 mg Oral Daily   heparin injection (subcutaneous)  5,000 Units Subcutaneous Q8H   hydrocortisone cream  1 application Topical BID   insulin aspart  0-15 Units Subcutaneous TID WC   insulin aspart  0-5 Units Subcutaneous QHS   insulin aspart  8 Units Subcutaneous TID WC   insulin glargine-yfgn  40 Units Subcutaneous Daily   levothyroxine  50 mcg Oral QAC breakfast   linagliptin  5 mg Oral Daily   magnesium oxide  400 mg Oral Daily   polyethylene glycol  17 g Oral Daily   pregabalin  150 mg Oral BID   senna-docusate  1 tablet Oral BID   sodium chloride flush  3 mL Intravenous Q12H   ursodiol  300 mg Oral TID    Infusions:  sodium chloride 10 mL/hr at 03/06/21 1900   furosemide 120 mg (03/07/21 0840)     PRN Medications: sodium chloride, acetaminophen, albuterol, sodium chloride flush     Assessment/Plan  Chest Pain w/ Elevated HS Trop/ CAD  - known CAD w/ h/o remote MI in 2000 and 2007 w/ BMS to RCA - mixed typical and atypical symptoms  - HS trop mildly elevated w/ flat trend, 247>>236 >>182>184. Does not suggest an acute event but possible she had event when symptoms first occurred over the weekend. Has new inferior and anterolateral Q waves on ECG that were not present 6 months ago.  HS Trop 182>184 - Would like to avoid cath given baseline CKD and risk for worsening renal failure but EF now down 30-35%  - Continues on IV heparin, plavix and aspirin.  - beta blocker - high-intensity statin   2. Acute on Chronic Diastolic CHF--> Now with reduced EF 30-35%.  - Echo 9/21 EF 55-60%, RV normal --> EF now down 30-35% Grade IIDD - Volume status difficult to assess due to body habitus but I dont think she has much to go. Check Reds Clip.  -Continue carvedilol 12.5 mg twice a day  - GDMT limted with CKD stage IV. No dig, sglt2i, spiro with creatinine > 3.   - Limited options.   3. AKI on Stage IV CKD - Baseline sCr ~3.5 - Scr 4.01 . 3.25  BUN > 100 - followed by outpatient nephrology , Dr Hollie Salk  - Check BMET daily    4. Type 2DM, uncontrolled  - poorly controlled, last hgb A1c 12.2 - management per primary team   5. Hypokalemia - K 3.9   6. Hyponatremia, likely hypervolemic -Stable today    Will discuss with Dr Haroldine Laws possible cath with reduced in in EF.  Concerned about contrast and that she may end up hemodialsysis.   Reds CLip 31% Stop IV lasix.   Darrick Grinder, NP  03/07/2021, 8:57 AM  Advanced Heart Failure Team Pager [phone] (M-F; 7a - 5p)  Please contact [facility] for night-coverage after hours (5p -7a ) and weekends on [email or web address]   Patient seen and examined with the above-signed Advanced Practice Provider and/or  Housestaff. I personally reviewed  laboratory data, imaging studies and relevant notes. I independently examined the patient and formulated the important aspects of the plan. I have edited the note to reflect any of my changes or salient points. I have personally discussed the plan with the patient and/or family.   Feels better with diuresis. Lasix stopped. ReDS 31%   Echo reviewed personally EF ~35% with inferior wall HK which is new   SCr 3.2  General:  Sitting up in chair  No resp difficulty HEENT: normal Neck: supple. no JVD. Carotids 2+ bilat; no bruits. No lymphadenopathy or thryomegaly appreciated. Cor: PMI nondisplaced. Regular rate & rhythm. No rubs, gallops or murmurs. Lungs: clear Abdomen: obese soft, nontender, nondistended. No hepatosplenomegaly. No bruits or masses. Good bowel sounds. Extremities: no cyanosis, clubbing, rash, tr edema Neuro: alert & orientedx3, cranial nerves grossly intact. moves all 4 extremities w/o difficulty. Affect pleasant  Volume status improved with lasix. SCr improved but still 3.2  Echo shows new inferior wall motion abnormality. No current angina. Can stop heparina.   With CKD IV and not surgical candidate would not pursue cath unless she develops refractory angina. Continue DAPT/statin.   Continue to adjust meds. Ambulate.   Glori Bickers, MD  6:04 PM

## 2021-03-07 NOTE — Progress Notes (Signed)
PROGRESS NOTE    Susan [name]  IRJ:[id number] DOB: 1950/07/09 DOA: 03/05/2021 PCP: [name], MD    No chief complaint on file.   Brief Narrative:  Susan [name] is a 70 y.o. female with medical history significant of CAD with stenting in 3016, chronic diastolic CHF, CKD stage IV, HTN, HLD, PVD, IDDM on insulin pump, presented with worsening of chest pain and shortness of breath.  Found to be in acute heart failure Also have uncontrolled diabetes And possible UTI    Subjective:  off heparin drip and nitro drip Denies chest pain,  She is on room air at rest, breathing is better Legs are less swollen ,  2.7 L urine output documented since admission, urine is cloudy, reports burning since monday Last bm a week ago Some rash on legs  Assessment & Plan:   Active Problems:   NSTEMI (non-ST elevated myocardial infarction) (HCC)   Non-STEMI On heparin drip and nitro drip Cardiology following, currently no plan for cardiac cath due to [place] Will follow cardiology recommendation  Acute on chronic diastolic CHF On IV Lasix, remain volume overloaded  Appreciate cardiology input, will follow recommendation  Hypokalemia Replace K  mag 2.6  Insulin-dependent diabetes , Type 2 diabetes Uncontrolled, with hyperglycemia A1c 12.2 Seen by endocrinology Dr [name] in 01/2021 insulin pump was adjusted twice in September She and her husband is not sure about pump setting They agree stop insulin pump here and use insulin longactive plus ssi Appreciate diabetes coordinator input  CKD 4 -Nephrology ?Dialysis  History of recurrent UTI On prophylactic Keflex chronically Report urinary symptom, urine appears cloudy Ua with many bacteria, +leuk  ,start rocephin for now, hold keflex, follow up on urine culture result   Constipation Continue MiraLAX, add Senokot  On ursodiol chronically   Class III obesity: Body mass index is 45.6 kg/m.Marland Kitchen        Unresulted Labs (From  admission, onward)     Start     Ordered   03/06/21 1557  Urine Culture  Once,   R       Question:  Indication  Answer:  Dysuria   03/06/21 1556   03/06/21 0109  Basic metabolic panel  Daily,   R      03/05/21 1712   03/06/21 3235  Basic metabolic panel  Daily,   R      03/05/21 1802              DVT prophylaxis: heparin injection 5,000 Units Start: 03/06/21 2200  Currently on heparin drip   Code Status: Full Family Communication: Patient Disposition:   Status is: Inpatient   Dispo: The patient is from: Home              Anticipated d/c is to: home with home health              Anticipated d/c date is: To be determined, need cardiology clearance                Consultants:  Cardiology/heart failure team  Procedures:  None  Antimicrobials:   Anti-infectives (From admission, onward)    Start     Dose/Rate Route Frequency Ordered Stop   03/07/21 1330  cefTRIAXone (ROCEPHIN) 1 g in sodium chloride 0.9 % 100 mL IVPB        1 g 200 mL/hr over 30 Minutes Intravenous Every 24 hours 03/07/21 1234     03/06/21 1000  cephALEXin (KEFLEX) capsule 250 mg  Status:  Discontinued        250 mg Oral Daily 03/05/21 1802 03/07/21 1233           Objective: Vitals:   03/06/21 1316 03/06/21 2020 03/07/21 0521 03/07/21 1204  BP: (!) 132/57 107/64 (!) 141/48 (!) 105/50  Pulse: 84 84 86 83  Resp: 12 15 18 20   Temp: 98.1 F (36.7 C) 98.3 F (36.8 C) 98.3 F (36.8 C) 98.6 F (37 C)  TempSrc: Oral Oral Oral Oral  SpO2: 96% 93% 95% 95%  Weight: 124.3 kg     Height: 5\' 5"  (1.651 m)       Intake/Output Summary (Last 24 hours) at 03/07/2021 1235 Last data filed at 03/07/2021 0500 Gross per 24 hour  Intake 919.25 ml  Output 1750 ml  Net -830.75 ml   Filed Weights   03/05/21 1215 03/06/21 1316  Weight: 124.7 kg 124.3 kg    Examination:  General exam: Obese , weak , chronically ill appearing ,alert, awake, communicative,calm, NAD Respiratory system:  diminished at  bases, no wheezing, respiratory effort normal. Cardiovascular system:  RRR.  Gastrointestinal system: Abdomen is nondistended, soft and nontender.  Normal bowel sounds heard. Central nervous system: Alert and oriented. No focal neurological deficits. Extremities:  +edema Skin: No rashes, lesions or ulcers Psychiatry: Judgement and insight appear normal. Mood & affect appropriate.     Data Reviewed: I have personally reviewed following labs and imaging studies  CBC: Recent Labs  Lab 03/05/21 1236 03/06/21 0500 03/07/21 0326  WBC 9.9 7.4 7.5  HGB 11.4* 10.8* 11.4*  HCT 36.2 34.4* 36.1  MCV 85.8 85.1 84.7  PLT 159 141* 510    Basic Metabolic Panel: Recent Labs  Lab 03/05/21 1236 03/06/21 0500 03/06/21 1808 03/07/21 0326  NA 131* 134* 136 139  K 3.7 2.9* 4.1 3.9  CL 93* 95* 99 102  CO2 25 25 26 26   GLUCOSE 339* 275* 278* 134*  BUN 109* 110* 107* 107*  CREATININE 4.01* 3.77* 3.38* 3.25*  CALCIUM 8.9 9.0 8.8* 9.3  MG  --   --   --  2.6*    GFR: Estimated Creatinine Clearance: 21 mL/min (A) (by C-G formula based on SCr of 3.25 mg/dL (H)).  Liver Function Tests: No results for input(s): AST, ALT, ALKPHOS, BILITOT, PROT, ALBUMIN in the last 168 hours.  CBG: Recent Labs  Lab 03/06/21 1202 03/06/21 1613 03/06/21 2033 03/07/21 0755 03/07/21 1202  GLUCAP 278* 233* 151* 147* 118*     Recent Results (from the past 240 hour(s))  Resp Panel by RT-PCR (Flu A&B, Covid) Nasopharyngeal Swab     Status: None   Collection Time: 03/05/21 12:29 PM   Specimen: Nasopharyngeal Swab; Nasopharyngeal(NP) swabs in vial transport medium  Result Value Ref Range Status   SARS Coronavirus 2 by RT PCR NEGATIVE NEGATIVE Final    Comment: (NOTE) SARS-CoV-2 target nucleic acids are NOT DETECTED.  The SARS-CoV-2 RNA is generally detectable in upper respiratory specimens during the acute phase of infection. The lowest concentration of SARS-CoV-2 viral copies this assay can detect  is 138 copies/mL. A negative result does not preclude SARS-Cov-2 infection and should not be used as the sole basis for treatment or other patient management decisions. A negative result may occur with  improper specimen collection/handling, submission of specimen other than nasopharyngeal swab, presence of viral mutation(s) within the areas targeted by this assay, and inadequate number of viral copies(<138 copies/mL). A negative result must be combined with clinical observations, patient history, and  epidemiological information. The expected result is Negative.  Fact Sheet for Patients:  EntrepreneurPulse.com.au  Fact Sheet for Healthcare Providers:  IncredibleEmployment.be  This test is no t yet approved or cleared by the [place] FDA and  has been authorized for detection and/or diagnosis of SARS-CoV-2 by FDA under an Emergency Use Authorization (EUA). This EUA will remain  in effect (meaning this test can be used) for the duration of the COVID-19 declaration under Section 564(b)(1) of the Act, 21 U.S.C.section 360bbb-3(b)(1), unless the authorization is terminated  or revoked sooner.       Influenza A by PCR NEGATIVE NEGATIVE Final   Influenza B by PCR NEGATIVE NEGATIVE Final    Comment: (NOTE) The Xpert Xpress SARS-CoV-2/FLU/RSV plus assay is intended as an aid in the diagnosis of influenza from Nasopharyngeal swab specimens and should not be used as a sole basis for treatment. Nasal washings and aspirates are unacceptable for Xpert Xpress SARS-CoV-2/FLU/RSV testing.  Fact Sheet for Patients: EntrepreneurPulse.com.au  Fact Sheet for Healthcare Providers: IncredibleEmployment.be  This test is not yet approved or cleared by the [place] FDA and has been authorized for detection and/or diagnosis of SARS-CoV-2 by FDA under an Emergency Use Authorization (EUA). This EUA will remain in effect  (meaning this test can be used) for the duration of the COVID-19 declaration under Section 564(b)(1) of the Act, 21 U.S.C. section 360bbb-3(b)(1), unless the authorization is terminated or revoked.  Performed at [facility], [place] [address] [place], [place] [address]   MICROSCOPIC MESSAGE     Status: None   Collection Time: 03/06/21  8:38 AM  Result Value Ref Range Status   Note   Final    Comment: This urine was analyzed for the presence of WBC,  RBC, bacteria, casts, and other formed elements.  Only those elements seen were reported. . .          Radiology Studies: DG Chest 2 View  Result Date: 03/05/2021 CLINICAL DATA:  Cough, shortness of breath EXAM: CHEST - 2 VIEW COMPARISON:  Chest radiograph 08/24/2020 FINDINGS: The cardiomediastinal silhouette is within normal limits. There are increased interstitial markings throughout both lungs. There are trace bilateral pleural effusions. There is no pneumothorax. There is no acute osseous abnormality.  Sh IMPRESSION: Suspect mild pulmonary interstitial edema with trace pleural effusions. Electronically Signed   By: Valetta Mole M.D.   On: 03/05/2021 13:17   ECHOCARDIOGRAM COMPLETE  Result Date: 03/06/2021    ECHOCARDIOGRAM REPORT   Patient Name:   Susan [name] Date of Exam: 03/06/2021 Medical Rec #:  [id number]    Height:       65.0 in Accession #:    [id number]   Weight:       274.0 lb Date of Birth:  08/15/1950    BSA:          2.261 m Patient Age:    16 years     BP:           126/69 mmHg Patient Gender: F            HR:           92 bpm. Exam Location:  Inpatient Procedure: 2D Echo Indications:    NSTEMI  History:        Patient has prior history of Echocardiogram examinations. CHF;                 Acute MI.  Sonographer:    DBB Referring Phys: [id number]  M SIMMONS IMPRESSIONS  1. Diffuse hypokinesis mid and basal inferior wall akinesis . Left ventricular ejection fraction, by estimation, is 30 to 35%. The left ventricle  has moderately decreased function. The left ventricle demonstrates regional wall motion abnormalities (see scoring diagram/findings for description). The left ventricular internal cavity size was mildly dilated. Left ventricular diastolic parameters are consistent with Grade II diastolic dysfunction (pseudonormalization). Elevated left ventricular end-diastolic pressure.  2. Right ventricular systolic function is normal. The right ventricular size is normal.  3. Left atrial size was severely dilated.  4. Right atrial size was moderately dilated.  5. The mitral valve is abnormal. Trivial mitral valve regurgitation. No evidence of mitral stenosis. Moderate mitral annular calcification.  6. The aortic valve is tricuspid. Aortic valve regurgitation is not visualized. Mild to moderate aortic valve sclerosis/calcification is present, without any evidence of aortic stenosis.  7. The inferior vena cava is normal in size with greater than 50% respiratory variability, suggesting right atrial pressure of 3 mmHg. FINDINGS  Left Ventricle: Diffuse hypokinesis mid and basal inferior wall akinesis. Left ventricular ejection fraction, by estimation, is 30 to 35%. The left ventricle has moderately decreased function. The left ventricle demonstrates regional wall motion abnormalities. The left ventricular internal cavity size was mildly dilated. There is no left ventricular hypertrophy. Left ventricular diastolic parameters are consistent with Grade II diastolic dysfunction (pseudonormalization). Elevated left ventricular end-diastolic pressure. Right Ventricle: The right ventricular size is normal. No increase in right ventricular wall thickness. Right ventricular systolic function is normal. Left Atrium: Left atrial size was severely dilated. Right Atrium: Right atrial size was moderately dilated. Pericardium: There is no evidence of pericardial effusion. Mitral Valve: The mitral valve is abnormal. There is mild thickening of the  mitral valve leaflet(s). There is mild calcification of the mitral valve leaflet(s). Moderate mitral annular calcification. Trivial mitral valve regurgitation. No evidence of mitral valve stenosis. MV peak gradient, 7.6 mmHg. The mean mitral valve gradient is 4.0 mmHg. Tricuspid Valve: The tricuspid valve is normal in structure. Tricuspid valve regurgitation is mild . No evidence of tricuspid stenosis. Aortic Valve: The aortic valve is tricuspid. Aortic valve regurgitation is not visualized. Mild to moderate aortic valve sclerosis/calcification is present, without any evidence of aortic stenosis. Aortic valve mean gradient measures 5.0 mmHg. Aortic valve peak gradient measures 9.4 mmHg. Aortic valve area, by VTI measures 2.07 cm. Pulmonic Valve: The pulmonic valve was normal in structure. Pulmonic valve regurgitation is not visualized. No evidence of pulmonic stenosis. Aorta: The aortic root is normal in size and structure. Venous: The inferior vena cava is normal in size with greater than 50% respiratory variability, suggesting right atrial pressure of 3 mmHg. IAS/Shunts: No atrial level shunt detected by color flow Doppler.  LEFT VENTRICLE PLAX 2D LVIDd:         5.50 cm   Diastology LVIDs:         3.60 cm   LV e' medial:    3.78 cm/s LV PW:         1.10 cm   LV E/e' medial:  32.3 LV IVS:        1.00 cm   LV e' lateral:   6.73 cm/s LVOT diam:     1.90 cm   LV E/e' lateral: 18.1 LV SV:         75 LV SV Index:   33 LVOT Area:     2.84 cm  RIGHT VENTRICLE RV Basal diam:  4.00 cm RV Mid diam:  3.60 cm RV S prime:     12.60 cm/s TAPSE (M-mode): 3.1 cm LEFT ATRIUM             Index        RIGHT ATRIUM           Index LA diam:        5.00 cm 2.21 cm/m   RA Area:     20.60 cm LA Vol (A2C):   65.8 ml 29.10 ml/m  RA Volume:   58.50 ml  25.87 ml/m LA Vol (A4C):   83.7 ml 37.01 ml/m LA Biplane Vol: 79.6 ml 35.20 ml/m  AORTIC VALVE                     PULMONIC VALVE AV Area (Vmax):    2.11 cm      PV Vmax:       0.94  m/s AV Area (Vmean):   2.13 cm      PV Peak grad:  3.6 mmHg AV Area (VTI):     2.07 cm AV Vmax:           153.00 cm/s AV Vmean:          110.000 cm/s AV VTI:            0.365 m AV Peak Grad:      9.4 mmHg AV Mean Grad:      5.0 mmHg LVOT Vmax:         114.00 cm/s LVOT Vmean:        82.500 cm/s LVOT VTI:          0.266 m LVOT/AV VTI ratio: 0.73 MITRAL VALVE                TRICUSPID VALVE MV Area (PHT): 4.83 cm     TR Peak grad:   37.2 mmHg MV Area VTI:   2.24 cm     TR Vmax:        305.00 cm/s MV Peak grad:  7.6 mmHg MV Mean grad:  4.0 mmHg     SHUNTS MV Vmax:       1.38 m/s     Systemic VTI:  0.27 m MV Vmean:      93.2 cm/s    Systemic Diam: 1.90 cm MV Decel Time: 157 msec MV E velocity: 122.00 cm/s MV A velocity: 84.40 cm/s MV E/A ratio:  1.45 Jenkins Rouge MD Electronically signed by Jenkins Rouge MD Signature Date/Time: 03/06/2021/5:48:19 PM    Final         Scheduled Meds:  allopurinol  100 mg Oral BID   aspirin EC  81 mg Oral Q breakfast   atorvastatin  80 mg Oral Daily   carvedilol  12.5 mg Oral BID WC   clopidogrel  75 mg Oral Q breakfast   ferrous sulfate  325 mg Oral BID WC   FLUoxetine  20 mg Oral Daily   heparin injection (subcutaneous)  5,000 Units Subcutaneous Q8H   hydrocortisone cream  1 application Topical BID   insulin aspart  0-15 Units Subcutaneous TID WC   insulin aspart  0-5 Units Subcutaneous QHS   insulin aspart  8 Units Subcutaneous TID WC   insulin glargine-yfgn  40 Units Subcutaneous Daily   levothyroxine  50 mcg Oral QAC breakfast   linagliptin  5 mg Oral Daily   magnesium oxide  400 mg Oral Daily   polyethylene glycol  17 g Oral Daily   pregabalin  150  mg Oral BID   senna-docusate  1 tablet Oral BID   sodium chloride flush  3 mL Intravenous Q12H   ursodiol  300 mg Oral TID   Continuous Infusions:  sodium chloride 10 mL/hr at 03/06/21 1900   cefTRIAXone (ROCEPHIN)  IV       LOS: 2 days   Time spent: 35mins Greater than 50% of this time was spent in  counseling, explanation of diagnosis, planning of further management, and coordination of care.   Voice Recognition Viviann Spare dictation system was used to create this note, attempts have been made to correct errors. Please contact the author with questions and/or clarifications.   Florencia Reasons, MD PhD FACP [facility]  Available via Epic secure chat 7am-7pm for nonurgent issues Please page for urgent issues To page the attending provider between 7A-7P or the covering provider during after hours 7P-7A, please log into the web site [email or web address] and access using universal [place] password for that web site. If you do not have the password, please call the hospital operator.    03/07/2021, 12:35 PM

## 2021-03-07 NOTE — TOC Initial Note (Signed)
Transition of Care [facility]) - Initial/Assessment Note    Patient Details  Name: Susan [name] MRN: [id number] Date of Birth: August 29, 1950  [facility] CM/SW Contact:    Koralynn Greenspan, LCSWA Phone Number: 03/07/2021, 4:46 PM  Clinical Narrative:                 [facility] CSW spoke with Mrs. Mears at bedside and completed a very brief SDOH with the patient who denied having any needs at this time. Patient reported they do have a PCP and they can get to the pharmacy to pick up their medications. CSW provided the patient with the social workers name and position and if anything changes to please reach out so that the CSW can provide support.  CSW will continue to follow throughout discharge.    Barriers to Discharge: Continued Medical Work up   Patient Goals and CMS Choice        Expected Discharge Plan and Services   In-house Referral: Clinical Social Work Discharge Planning Services: CM Consult   Living arrangements for the past 2 months: Single Family Home                                      Prior Living Arrangements/Services Living arrangements for the past 2 months: Single Family Home Lives with:: Self, Spouse, Adult Children Patient language and need for interpreter reviewed:: Yes        Need for Family Participation in Patient Care: No (Comment) Care giver support system in place?: No (comment)   Criminal Activity/Legal Involvement Pertinent to Current Situation/Hospitalization: No - Comment as needed  Activities of Daily Living Home Assistive Devices/Equipment: Cane (specify quad or straight) ADL Screening (condition at time of admission) Patient's cognitive ability adequate to safely complete daily activities?: Yes Is the patient deaf or have difficulty hearing?: No Does the patient have difficulty seeing, even when wearing glasses/contacts?: Yes Does the patient have difficulty concentrating, remembering, or making decisions?: No Patient able to  express need for assistance with ADLs?: No Does the patient have difficulty dressing or bathing?: No Independently performs ADLs?: No Communication: Independent Dressing (OT): Independent Grooming: Independent Feeding: Independent (cannot stand long when cooking) Bathing: Independent Toileting: Independent In/Out Bed: Independent Walks in Home: Needs assistance (cane) Is this a change from baseline?: Pre-admission baseline Does the patient have difficulty walking or climbing stairs?: Yes Weakness of Legs: Both Weakness of Arms/Hands: None  Permission Sought/Granted   Permission granted to share information with : Yes, Verbal Permission Granted              Emotional Assessment Appearance:: Appears stated age Attitude/Demeanor/Rapport: Engaged Affect (typically observed): Pleasant Orientation: : Oriented to Self, Oriented to Place, Oriented to  Time, Oriented to Situation   Psych Involvement: No (comment)  Admission diagnosis:  NSTEMI (non-ST elevated myocardial infarction) [facility]) [I21.4] Patient Active Problem List   Diagnosis Date Noted   NSTEMI (non-ST elevated myocardial infarction) (HCC) 03/05/2021   Acute renal failure superimposed on stage 4 chronic kidney disease (HCC) 08/22/2020   Hypoalbuminemia 05/25/2020   GERD (gastroesophageal reflux disease) 05/25/2020   Pressure injury of skin 05/17/2020   Type 2 diabetes mellitus with diabetic polyneuropathy, with long-term current use of insulin (HCC) 03/07/2020   Obesity, Class III, BMI 40-49.9 (morbid obesity) (HCC) 03/07/2020   Common bile duct (CBD) obstruction 05/28/2019   Benign neoplasm of ascending colon    Benign  neoplasm of transverse colon    Benign neoplasm of descending colon    Benign neoplasm of sigmoid colon    Gastric polyps    Hyperkalemia 03/11/2019   Prolonged QT interval 03/11/2019   Onychomycosis 06/21/2018   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)    Venous ulcer of both lower  extremities with varicose veins (HCC)    PVD (peripheral vascular disease) (HCC) 10/26/2017   E-coli UTI 07/27/2017   Hypothyroidism 07/27/2017   PAH (pulmonary artery hypertension) (HCC)    Impaired ambulation 07/19/2017   Leg cramps 02/27/2017   Peripheral edema 01/12/2017   Diabetic neuropathy (HCC) 11/12/2016   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 mellitus (HCC) 10/24/2015   Anemia 10/03/2015   Generalized anxiety disorder 10/03/2015   Insomnia 10/03/2015   Hyperglycemia due to diabetes mellitus (HCC) 06/07/2015   Chronic diastolic CHF (congestive heart failure) (HCC) 06/07/2015   Non compliance with medical treatment 04/17/2014   Rotator cuff tear 03/14/2014   Class 3 obesity (HCC) 09/23/2013   Hypotension 12/25/2012   Urinary incontinence    MDD (major depressive disorder) 11/12/2010   RBBB (right bundle branch block)    Coronary artery disease involving native coronary artery of native heart without angina pectoris    Hyperlipemia 01/22/2009   Essential hypertension 01/22/2009   PCP:  Donita Brooks, MD Pharmacy:   [facility] [phone] [place], [place] - [address] [place] [address] [place] [address] Phone: [phone] Fax: [phone]     Social Determinants of Health (SDOH) Interventions Food Insecurity Interventions: Intervention Not Indicated Financial Strain Interventions: Intervention Not Indicated Housing Interventions: Intervention Not Indicated Transportation Interventions: Intervention Not Indicated  Readmission Risk Interventions Readmission Risk Prevention Plan 05/28/2020 03/12/2020 02/09/2020  Transportation Screening Complete Complete Complete  Medication Review Oceanographer) Complete Complete Complete  PCP or Specialist appointment within 3-5 days of discharge Complete Complete Complete  HRI or Home Care Consult Complete Complete Complete  SW Recovery Care/Counseling Consult Complete Complete Complete   Palliative Care Screening Not Applicable Not Applicable Not Applicable  Skilled Nursing Facility Not Applicable Not Applicable Not Applicable  Some recent data might be hidden     Eri Mcevers, MSW, LCSWA [phone] Heart Failure Social Worker

## 2021-03-07 NOTE — Evaluation (Signed)
Physical Therapy Evaluation Patient Details Name: Susan [name] MRN: [id number] DOB: 1950/07/01 Today's Date: 03/07/2021  History of Present Illness  70 y.o. female presented 03/05/21 with worsening of chest pain and shortness of breath. Found to have elevated BP,  O2 saturation borderline hypoxic, x-ray showed bilateral pulmonary congestion.  Troponins 247> 236.  EKG no new ST changes. Admitted for treatment of NSTEMI acute on chronic diastolic CHF, hypokalemia. PMH: CAD with stenting in 0086, chronic diastolic CHF, CKD stage IV, HTN, HLD, PVD, IDDM on insulin pump  Clinical Impression  PTA pt living with husband in mobile home with ramped entrance. Pt reports she uses RW for ambulation in house and [facility] for ambulation in the community. Pt is independent with ADLs, drives and uses scooter at grocery store for shopping. Pt is currently limited in safe mobility by decreased ability for multitasking, decreased sensation in bilateral LE, which impacts dynamic balance and generalized weakness. Pt is currently mod I for bed mobility, min guard for transfers and min guard for ambulation with RW. Pt does experience 1x LoB requiring modA for steadying when trying to walk in hallway and carry on conversation. PT recommending HHPT to work on strength and balance for safety with mobility. PT will continue to follow acutely.         Recommendations for follow up therapy are one component of a multi-disciplinary discharge planning process, led by the attending physician.  Recommendations may be updated based on patient status, additional functional criteria and insurance authorization.  Follow Up Recommendations Home health PT;Supervision for mobility/OOB    Equipment Recommendations  None recommended by PT (has necessary equipment)       Precautions / Restrictions Precautions Precautions: Fall Restrictions Weight Bearing Restrictions: No      Mobility  Bed Mobility Overal bed mobility: Modified  Independent             General bed mobility comments: increased time and use of bedrail to come to EoB    Transfers Overall transfer level: Needs assistance Equipment used: Rolling walker (2 wheeled) Transfers: Sit to/from [facility] Sit to Stand: Supervision Stand pivot transfers: Supervision       General transfer comment: supervision for power up to RW and pivoting to [facility], supervsion for power up from [name] to RW  Ambulation/Gait Ambulation/Gait assistance: Mod assist;Min guard Gait Distance (Feet): 60 Feet Assistive device: Rolling walker (2 wheeled) Gait Pattern/deviations: Step-through pattern;Decreased step length - right;Decreased step length - left;Decreased weight shift to right;Trunk flexed;Shuffle Gait velocity: WFL Gait velocity interpretation: <1.8 ft/sec, indicate of risk for recurrent falls General Gait Details: min guard for safety, vc for navigation of RW around obstacles, HF NP and Pharmacist enter as pt begins ambulation, pt with 1x LoB requiring modA to steady when her RW wheel hit an obstacle in hallway while she was ambulating and trying to carry on conversation at same time, ability for self steady decreased by peripheral neuropathy and altered sensation and proprioception.      Balance Overall balance assessment: Needs assistance Sitting-balance support: Feet supported;Feet unsupported;No upper extremity supported Sitting balance-Leahy Scale: Good     Standing balance support: During functional activity;No upper extremity supported;Single extremity supported;Bilateral upper extremity supported Standing balance-Leahy Scale: Fair Standing balance comment: requires UE support for dynamic activities                             Pertinent Vitals/Pain Pain Assessment: No/denies pain  Home Living Family/patient expects to be discharged to:: Private residence Living Arrangements: Spouse/significant other Available Help at  Discharge: Family;Available PRN/intermittently;Other (Comment) (husband available in evening, son available during day but son has some disabilities, can help with less exertional tasks) Type of Home: Mobile home Home Access: Ramped entrance     Home Layout: One level Home Equipment: Grab bars - tub/shower;Walker - 2 wheels;Walker - 4 wheels;Bedside commode;Cane - quad Additional Comments: pt reports using RW in home, but will take cane for community distances.    Prior Function Level of Independence: Independent with assistive device(s)         Comments: pt ambulates with RW in house and uses SPC in community, drives, pt uses scooter at grocery store     [place]: Right    Extremity/Trunk Assessment   Upper Extremity Assessment Upper Extremity Assessment: Generalized weakness    Lower Extremity Assessment Lower Extremity Assessment: RLE deficits/detail;LLE deficits/detail RLE Deficits / Details: R transmetatarsal amputation, LE strength grossly 3+/5 RLE Sensation: history of peripheral neuropathy;decreased light touch;decreased proprioception RLE Coordination: decreased fine motor LLE Sensation: history of peripheral neuropathy;decreased light touch;decreased proprioception LLE Coordination: decreased fine motor    Cervical / Trunk Assessment Cervical / Trunk Assessment: Kyphotic  Communication   Communication: No difficulties  Cognition Arousal/Alertness: Awake/alert Behavior During Therapy: WFL for tasks assessed/performed Overall Cognitive Status: Within Functional Limits for tasks assessed                                        General Comments General comments (skin integrity, edema, etc.): VSS on RA        Assessment/Plan    PT Assessment Patient needs continued PT services  PT Problem List Decreased balance;Decreased mobility;Impaired sensation;Decreased safety awareness       PT Treatment Interventions DME  instruction;Gait training;Functional mobility training;Therapeutic activities;Therapeutic exercise;Balance training;Cognitive remediation;Patient/family education    PT Goals (Current goals can be found in the Care Plan section)  Acute Rehab PT Goals Patient Stated Goal: get home to dogs PT Goal Formulation: With patient Time For Goal Achievement: 03/21/21 Potential to Achieve Goals: Fair    Frequency Min 3X/week    AM-PAC PT "6 Clicks" Mobility  Outcome Measure Help needed turning from your back to your side while in a flat bed without using bedrails?: None Help needed moving from lying on your back to sitting on the side of a flat bed without using bedrails?: None Help needed moving to and from a bed to a chair (including a wheelchair)?: A Little Help needed standing up from a chair using your arms (e.g., wheelchair or bedside chair)?: A Little Help needed to walk in hospital room?: A Little Help needed climbing 3-5 steps with a railing? : A Lot 6 Click Score: 19    End of Session   Activity Tolerance: Patient tolerated treatment well Patient left: in chair;with call bell/phone within reach;with chair alarm set;Other (comment) (Cardiac PA and pharmacist in room at end of session) Nurse Communication: Mobility status PT Visit Diagnosis: Unsteadiness on feet (R26.81);Other abnormalities of gait and mobility (R26.89);Muscle weakness (generalized) (M62.81);Difficulty in walking, not elsewhere classified (R26.2)    Time: 0277-4128 PT Time Calculation (min) (ACUTE ONLY): 32 min   Charges:   PT Evaluation $PT Eval Moderate Complexity: 1 Mod PT Treatments $Therapeutic Activity: 8-22 mins        Bailey Mech  Fleet PT, DPT Acute Rehabilitation Services Pager [phone] Office [phone]   [place] 03/07/2021, 10:22 AM

## 2021-03-08 ENCOUNTER — Other Ambulatory Visit (HOSPITAL_COMMUNITY): Payer: Self-pay

## 2021-03-08 LAB — GLUCOSE, CAPILLARY
Glucose-Capillary: 182 mg/dL — ABNORMAL HIGH (ref 70–99)
Glucose-Capillary: 245 mg/dL — ABNORMAL HIGH (ref 70–99)
Glucose-Capillary: 187 mg/dL — ABNORMAL HIGH (ref 70–99)
Glucose-Capillary: 81 mg/dL (ref 70–99)
Glucose-Capillary: 140 mg/dL — ABNORMAL HIGH (ref 70–99)

## 2021-03-08 LAB — BASIC METABOLIC PANEL
Anion gap: 12 (ref 5–15)
BUN: 109 mg/dL — ABNORMAL HIGH (ref 8–23)
CO2: 24 mmol/L (ref 22–32)
Calcium: 9.1 mg/dL (ref 8.9–10.3)
Chloride: 100 mmol/L (ref 98–111)
Creatinine, Ser: 3.4 mg/dL — ABNORMAL HIGH (ref 0.44–1.00)
GFR, Estimated: 14 mL/min — ABNORMAL LOW (ref >60)
Glucose, Bld: 179 mg/dL — ABNORMAL HIGH (ref 70–99)
Potassium: 3.5 mmol/L (ref 3.5–5.1)
Sodium: 136 mmol/L (ref 135–145)

## 2021-03-08 LAB — URINE CULTURE
MICRO NUMBER:: 12493550
SPECIMEN QUALITY:: ADEQUATE

## 2021-03-08 MED ORDER — PROCHLORPERAZINE EDISYLATE 10 MG/2ML IJ SOLN
5.0000 mg | Freq: Once | INTRAMUSCULAR | Status: AC | PRN
  Administered 2021-03-08: 5 mg via INTRAVENOUS
  Filled 2021-03-08: qty 2

## 2021-03-08 MED ORDER — ISOSORBIDE MONONITRATE ER 30 MG PO TB24
15.0000 mg | ORAL_TABLET | Freq: Every day | ORAL | Status: DC
  Administered 2021-03-08 – 2021-03-10 (×3): 15 mg via ORAL
  Filled 2021-03-08 (×3): qty 1

## 2021-03-08 MED ORDER — HYDRALAZINE HCL 10 MG PO TABS
10.0000 mg | ORAL_TABLET | Freq: Three times a day (TID) | ORAL | Status: DC
  Administered 2021-03-08 – 2021-03-10 (×7): 10 mg via ORAL
  Filled 2021-03-08 (×7): qty 1

## 2021-03-08 MED ORDER — POTASSIUM CHLORIDE CRYS ER 20 MEQ PO TBCR
40.0000 meq | EXTENDED_RELEASE_TABLET | Freq: Four times a day (QID) | ORAL | Status: AC
  Administered 2021-03-08 (×2): 40 meq via ORAL
  Filled 2021-03-08 (×2): qty 2

## 2021-03-08 NOTE — Progress Notes (Signed)
PROGRESS NOTE    Susan [name]  HER:[id number] DOB: [birth year] DOA: 03/05/2021 PCP: [name], MD    No chief complaint on file.   Brief Narrative:  Susan [name] is a 70 y.o. 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  Found to be in acute heart failure Also have uncontrolled diabetes And possible UTI    Subjective:  She reported 4 watery diarrhea since 6 am, reports " feeling sick in my stomach, did not eat much of the breakfast" Urine culture grew g-rods, still reports dysuria Urine output not documented, reports less sob, less edema She is hard of hearing  Reports topical steroids cream helped rash on legs  Assessment & Plan:   Active Problems:   NSTEMI (non-ST elevated myocardial infarction) (HCC)   Non-STEMI On heparin drip and nitro drip Cardiology following, currently no plan for cardiac cath due to [place] Will follow cardiology recommendation  Acute on chronic diastolic CHF Less edema, less sob Management per cardiology Appreciate cardiology input, will follow recommendation  Hypokalemia Replace K  mag 2.6  Insulin-dependent diabetes , Type 2 diabetes Uncontrolled, with hyperglycemia A1c 12.2 Seen by endocrinology Dr [name] in 01/2021 insulin pump was adjusted twice in September She and her husband is not sure about pump setting They agree stop insulin pump here and use insulin longactive plus ssi Appreciate diabetes coordinator input  CKD 4 -Nephrology ?Dialysis  History of recurrent UTI On prophylactic Keflex chronically Report urinary symptom, urine appears cloudy Ua with many bacteria, +leuk  ,started rocephin,  hold keflex, follow up on urine culture result   Constipation Continue MiraLAX, add Senokot Resolved Now having diarrhea Hold stool softener, she did not received  any today  On ursodiol chronically   Class III obesity: Body mass index is 45.6 kg/m.Marland Kitchen        Unresulted Labs (From admission, onward)     Start     Ordered   03/06/21 1497  Basic metabolic panel  Daily,   R      03/05/21 1712   03/06/21 0263  Basic metabolic panel  Daily,   R      03/05/21 1802              DVT prophylaxis: heparin injection 5,000 Units Start: 03/06/21 2200  Currently on heparin drip   Code Status: Full Family Communication: Patient Disposition:   Status is: Inpatient   Dispo: The patient is from: Home              Anticipated d/c is to: home with home health              Anticipated d/c date is: To be determined, need cardiology clearance                Consultants:  Cardiology/heart failure team  Procedures:  None  Antimicrobials:   Anti-infectives (From admission, onward)    Start     Dose/Rate Route Frequency Ordered Stop   03/07/21 1330  cefTRIAXone (ROCEPHIN) 1 g in sodium chloride 0.9 % 100 mL IVPB        1 g 200 mL/hr over 30 Minutes Intravenous Every 24 hours 03/07/21 1234     03/06/21 1000  cephALEXin (KEFLEX) capsule 250 mg  Status:  Discontinued        250 mg Oral Daily 03/05/21 1802  03/07/21 1233           Objective: Vitals:   03/07/21 0800 03/07/21 1204 03/07/21 2027 03/08/21 0907  BP:  (!) 105/50 (!) 151/76 (!) 145/82  Pulse: 83 83 85 98  Resp:  20 18   Temp:  98.6 F (37 C) 98.2 F (36.8 C)   TempSrc:  Oral Oral   SpO2: 93% 95% 96%   Weight:      Height:        Intake/Output Summary (Last 24 hours) at 03/08/2021 0939 Last data filed at 03/08/2021 0400 Gross per 24 hour  Intake 315.78 ml  Output --  Net 315.78 ml   Filed Weights   03/05/21 1215 03/06/21 1316  Weight: 124.7 kg 124.3 kg    Examination:  General exam: Obese , weak , chronically ill appearing ,alert, awake, communicative,calm, NAD Respiratory system:  diminished at bases, no wheezing, respiratory effort  normal. Cardiovascular system:  RRR.  Gastrointestinal system: Abdomen is nondistended, soft and nontender.  Normal bowel sounds heard. Central nervous system: Alert and oriented. No focal neurological deficits. Extremities:  +edema Skin: No rashes, lesions or ulcers Psychiatry: Judgement and insight appear normal. Mood & affect appropriate.     Data Reviewed: I have personally reviewed following labs and imaging studies  CBC: Recent Labs  Lab 03/05/21 1236 03/06/21 0500 03/07/21 0326  WBC 9.9 7.4 7.5  HGB 11.4* 10.8* 11.4*  HCT 36.2 34.4* 36.1  MCV 85.8 85.1 84.7  PLT 159 141* 665    Basic Metabolic Panel: Recent Labs  Lab 03/05/21 1236 03/06/21 0500 03/06/21 1808 03/07/21 0326 03/08/21 0129  NA 131* 134* 136 139 136  K 3.7 2.9* 4.1 3.9 3.5  CL 93* 95* 99 102 100  CO2 25 25 26 26 24   GLUCOSE 339* 275* 278* 134* 179*  BUN 109* 110* 107* 107* 109*  CREATININE 4.01* 3.77* 3.38* 3.25* 3.40*  CALCIUM 8.9 9.0 8.8* 9.3 9.1  MG  --   --   --  2.6*  --     GFR: Estimated Creatinine Clearance: 20.1 mL/min (A) (by C-G formula based on SCr of 3.4 mg/dL (H)).  Liver Function Tests: No results for input(s): AST, ALT, ALKPHOS, BILITOT, PROT, ALBUMIN in the last 168 hours.  CBG: Recent Labs  Lab 03/07/21 0755 03/07/21 1202 03/07/21 1648 03/07/21 2133 03/08/21 0742  GLUCAP 147* 118* 186* 139* 182*     Recent Results (from the past 240 hour(s))  Resp Panel by RT-PCR (Flu A&B, Covid) Nasopharyngeal Swab     Status: None   Collection Time: 03/05/21 12:29 PM   Specimen: Nasopharyngeal Swab; Nasopharyngeal(NP) swabs in vial transport medium  Result Value Ref Range Status   SARS Coronavirus 2 by RT PCR NEGATIVE NEGATIVE Final    Comment: (NOTE) SARS-CoV-2 target nucleic acids are NOT DETECTED.  The SARS-CoV-2 RNA is generally detectable in upper respiratory specimens during the acute phase of infection. The lowest concentration of SARS-CoV-2 viral copies this assay  can detect is 138 copies/mL. A negative result does not preclude SARS-Cov-2 infection and should not be used as the sole basis for treatment or other patient management decisions. A negative result may occur with  improper specimen collection/handling, submission of specimen other than nasopharyngeal swab, presence of viral mutation(s) within the areas targeted by this assay, and inadequate number of viral copies(<138 copies/mL). A negative result must be combined with clinical observations, patient history, and epidemiological information. The expected result is Negative.  Fact Sheet for Patients:  EntrepreneurPulse.com.au  Fact Sheet for Healthcare Providers:  IncredibleEmployment.be  This test is no t yet approved or cleared by the [place] FDA and  has been authorized for detection and/or diagnosis of SARS-CoV-2 by FDA under an Emergency Use Authorization (EUA). This EUA will remain  in effect (meaning this test can be used) for the duration of the COVID-19 declaration under Section 564(b)(1) of the Act, 21 U.S.C.section 360bbb-3(b)(1), unless the authorization is terminated  or revoked sooner.       Influenza A by PCR NEGATIVE NEGATIVE Final   Influenza B by PCR NEGATIVE NEGATIVE Final    Comment: (NOTE) The Xpert Xpress SARS-CoV-2/FLU/RSV plus assay is intended as an aid in the diagnosis of influenza from Nasopharyngeal swab specimens and should not be used as a sole basis for treatment. Nasal washings and aspirates are unacceptable for Xpert Xpress SARS-CoV-2/FLU/RSV testing.  Fact Sheet for Patients: EntrepreneurPulse.com.au  Fact Sheet for Healthcare Providers: IncredibleEmployment.be  This test is not yet approved or cleared by the [place] FDA and has been authorized for detection and/or diagnosis of SARS-CoV-2 by FDA under an Emergency Use Authorization (EUA). This EUA will  remain in effect (meaning this test can be used) for the duration of the COVID-19 declaration under Section 564(b)(1) of the Act, 21 U.S.C. section 360bbb-3(b)(1), unless the authorization is terminated or revoked.  Performed at [facility], [place] [address] [place], [place] [address]   MICROSCOPIC MESSAGE     Status: None   Collection Time: 03/06/21  8:38 AM  Result Value Ref Range Status   Note   Final    Comment: This urine was analyzed for the presence of WBC,  RBC, bacteria, casts, and other formed elements.  Only those elements seen were reported. . .   Urine Culture     Status: Abnormal (Preliminary result)   Collection Time: 03/06/21  3:57 PM   Specimen: Urine, Clean Catch  Result Value Ref Range Status   Specimen Description URINE, CLEAN CATCH  Final   Special Requests NONE  Final   Culture (A)  Final    >=100,000 COLONIES/mL GRAM NEGATIVE RODS SUSCEPTIBILITIES TO FOLLOW Performed at [facility], [address] [place], [place] [address]    Report Status PENDING  Incomplete         Radiology Studies: ECHOCARDIOGRAM COMPLETE  Result Date: 03/06/2021    ECHOCARDIOGRAM REPORT   Patient Name:   DOREATHA OFFER Date of Exam: 03/06/2021 Medical Rec #:  [id number]    Height:       65.0 in Accession #:    [id number]   Weight:       274.0 lb Date of Birth:  Jan 17, 1951    BSA:          2.261 m Patient Age:    89 years     BP:           126/69 mmHg Patient Gender: F            HR:           92 bpm. Exam Location:  Inpatient Procedure: 2D Echo Indications:    NSTEMI  History:        Patient has prior history of Echocardiogram examinations. CHF;                 Acute MI.  Sonographer:    DBB Referring Phys: [name]  1. Diffuse hypokinesis mid and basal inferior wall akinesis .  Left ventricular ejection fraction, by estimation, is 30 to 35%. The left ventricle has moderately decreased function. The left ventricle demonstrates regional wall  motion abnormalities (see scoring diagram/findings for description). The left ventricular internal cavity size was mildly dilated. Left ventricular diastolic parameters are consistent with Grade II diastolic dysfunction (pseudonormalization). Elevated left ventricular end-diastolic pressure.  2. Right ventricular systolic function is normal. The right ventricular size is normal.  3. Left atrial size was severely dilated.  4. Right atrial size was moderately dilated.  5. The mitral valve is abnormal. Trivial mitral valve regurgitation. No evidence of mitral stenosis. Moderate mitral annular calcification.  6. The aortic valve is tricuspid. Aortic valve regurgitation is not visualized. Mild to moderate aortic valve sclerosis/calcification is present, without any evidence of aortic stenosis.  7. The inferior vena cava is normal in size with greater than 50% respiratory variability, suggesting right atrial pressure of 3 mmHg. FINDINGS  Left Ventricle: Diffuse hypokinesis mid and basal inferior wall akinesis. Left ventricular ejection fraction, by estimation, is 30 to 35%. The left ventricle has moderately decreased function. The left ventricle demonstrates regional wall motion abnormalities. The left ventricular internal cavity size was mildly dilated. There is no left ventricular hypertrophy. Left ventricular diastolic parameters are consistent with Grade II diastolic dysfunction (pseudonormalization). Elevated left ventricular end-diastolic pressure. Right Ventricle: The right ventricular size is normal. No increase in right ventricular wall thickness. Right ventricular systolic function is normal. Left Atrium: Left atrial size was severely dilated. Right Atrium: Right atrial size was moderately dilated. Pericardium: There is no evidence of pericardial effusion. Mitral Valve: The mitral valve is abnormal. There is mild thickening of the mitral valve leaflet(s). There is mild calcification of the mitral valve  leaflet(s). Moderate mitral annular calcification. Trivial mitral valve regurgitation. No evidence of mitral valve stenosis. MV peak gradient, 7.6 mmHg. The mean mitral valve gradient is 4.0 mmHg. Tricuspid Valve: The tricuspid valve is normal in structure. Tricuspid valve regurgitation is mild . No evidence of tricuspid stenosis. Aortic Valve: The aortic valve is tricuspid. Aortic valve regurgitation is not visualized. Mild to moderate aortic valve sclerosis/calcification is present, without any evidence of aortic stenosis. Aortic valve mean gradient measures 5.0 mmHg. Aortic valve peak gradient measures 9.4 mmHg. Aortic valve area, by VTI measures 2.07 cm. Pulmonic Valve: The pulmonic valve was normal in structure. Pulmonic valve regurgitation is not visualized. No evidence of pulmonic stenosis. Aorta: The aortic root is normal in size and structure. Venous: The inferior vena cava is normal in size with greater than 50% respiratory variability, suggesting right atrial pressure of 3 mmHg. IAS/Shunts: No atrial level shunt detected by color flow Doppler.  LEFT VENTRICLE PLAX 2D LVIDd:         5.50 cm   Diastology LVIDs:         3.60 cm   LV e' medial:    3.78 cm/s LV PW:         1.10 cm   LV E/e' medial:  32.3 LV IVS:        1.00 cm   LV e' lateral:   6.73 cm/s LVOT diam:     1.90 cm   LV E/e' lateral: 18.1 LV SV:         75 LV SV Index:   33 LVOT Area:     2.84 cm  RIGHT VENTRICLE RV Basal diam:  4.00 cm RV Mid diam:    3.60 cm RV S prime:     12.60 cm/s TAPSE (  M-mode): 3.1 cm LEFT ATRIUM             Index        RIGHT ATRIUM           Index LA diam:        5.00 cm 2.21 cm/m   RA Area:     20.60 cm LA Vol (A2C):   65.8 ml 29.10 ml/m  RA Volume:   58.50 ml  25.87 ml/m LA Vol (A4C):   83.7 ml 37.01 ml/m LA Biplane Vol: 79.6 ml 35.20 ml/m  AORTIC VALVE                     PULMONIC VALVE AV Area (Vmax):    2.11 cm      PV Vmax:       0.94 m/s AV Area (Vmean):   2.13 cm      PV Peak grad:  3.6 mmHg AV Area  (VTI):     2.07 cm AV Vmax:           153.00 cm/s AV Vmean:          110.000 cm/s AV VTI:            0.365 m AV Peak Grad:      9.4 mmHg AV Mean Grad:      5.0 mmHg LVOT Vmax:         114.00 cm/s LVOT Vmean:        82.500 cm/s LVOT VTI:          0.266 m LVOT/AV VTI ratio: 0.73 MITRAL VALVE                TRICUSPID VALVE MV Area (PHT): 4.83 cm     TR Peak grad:   37.2 mmHg MV Area VTI:   2.24 cm     TR Vmax:        305.00 cm/s MV Peak grad:  7.6 mmHg MV Mean grad:  4.0 mmHg     SHUNTS MV Vmax:       1.38 m/s     Systemic VTI:  0.27 m MV Vmean:      93.2 cm/s    Systemic Diam: 1.90 cm MV Decel Time: 157 msec MV E velocity: 122.00 cm/s MV A velocity: 84.40 cm/s MV E/A ratio:  1.45 Jenkins Rouge MD Electronically signed by Jenkins Rouge MD Signature Date/Time: 03/06/2021/5:48:19 PM    Final         Scheduled Meds:  allopurinol  100 mg Oral BID   aspirin EC  81 mg Oral Q breakfast   atorvastatin  80 mg Oral Daily   carvedilol  12.5 mg Oral BID WC   clopidogrel  75 mg Oral Q breakfast   ferrous sulfate  325 mg Oral BID WC   FLUoxetine  20 mg Oral Daily   heparin injection (subcutaneous)  5,000 Units Subcutaneous Q8H   hydrocortisone cream  1 application Topical BID   insulin aspart  0-15 Units Subcutaneous TID WC   insulin aspart  0-5 Units Subcutaneous QHS   insulin aspart  8 Units Subcutaneous TID WC   insulin glargine-yfgn  40 Units Subcutaneous Daily   levothyroxine  50 mcg Oral QAC breakfast   linagliptin  5 mg Oral Daily   polyethylene glycol  17 g Oral Daily   potassium chloride  40 mEq Oral Q6H   pregabalin  150 mg Oral BID   senna-docusate  1 tablet Oral BID  sodium chloride flush  3 mL Intravenous Q12H   ursodiol  300 mg Oral TID   Continuous Infusions:  sodium chloride 10 mL/hr at 03/06/21 1900   cefTRIAXone (ROCEPHIN)  IV 1 g (03/07/21 1351)     LOS: 3 days   Time spent: 76mins Greater than 50% of this time was spent in counseling, explanation of diagnosis, planning of  further management, and coordination of care.   Voice Recognition Viviann Spare dictation system was used to create this note, attempts have been made to correct errors. Please contact the author with questions and/or clarifications.   Florencia Reasons, MD PhD FACP [facility]  Available via Epic secure chat 7am-7pm for nonurgent issues Please page for urgent issues To page the attending provider between 7A-7P or the covering provider during after hours 7P-7A, please log into the web site [email or web address] and access using universal [place] password for that web site. If you do not have the password, please call the hospital operator.    03/08/2021, 9:39 AM

## 2021-03-08 NOTE — Progress Notes (Addendum)
Advanced Heart Failure Rounding Note  PCP-Cardiologist: Glori Bickers, MD    Patient Profile   Susan [name] is a 70 y.o. female with history CAD s/p MI 2000 s/p BMS to RCA and 2007 s/p another BMS to RCA, RBBB, chronic diastolic HF, stage IV CKD. Now admitted with CP with elevated HS troponin, A/C diastolic HF and AKI.  Subjective:   ReDS 31% yesterday. IV lasix discontinued.  Scr 4.0 -> 3.8 -> 3.25 -> 3.40  Is/Os not measured. No weight charted but reports she was 264 standing this am.  Echo, 03/06/2021: LVEF 30-35%, new inferior hypokinesis, RV okay, trivial MR,   Objective:   Weight Range: 124.3 kg Body mass index is 45.6 kg/m.   Vital Signs:   Temp:  [98.2 F (36.8 C)-98.6 F (37 C)] 98.2 F (36.8 C) (10/13 2027) Pulse Rate:  [83-98] 98 (10/14 0907) Resp:  [18-20] 18 (10/13 2027) BP: (105-151)/(50-82) 145/82 (10/14 0907) SpO2:  [95 %-96 %] 96 % (10/13 2027) Last BM Date: 03/07/21  Weight change: Filed Weights   03/05/21 1215 03/06/21 1316  Weight: 124.7 kg 124.3 kg    Intake/Output:   Intake/Output Summary (Last 24 hours) at 03/08/2021 1023 Last data filed at 03/08/2021 0400 Gross per 24 hour  Intake 315.78 ml  Output --  Net 315.78 ml      Physical Exam  General:  No acute distress. Sitting up in bed. HEENT: normal Neck: supple. JVD difficult to assess. Carotids 2+ bilat; no bruits. No lymphadenopathy or thryomegaly appreciated. Cor: PMI nondisplaced. Regular rate & rhythm. No rubs, gallops or murmurs. Lungs: clear Abdomen: soft, nontender, nondistended. No hepatosplenomegaly. No bruits or masses. Good bowel sounds. Extremities: no cyanosis, clubbing, rash, edema, compression stockings on. Neuro: alert & orientedx3, cranial nerves grossly intact. moves all 4 extremities w/o difficulty. Affect pleasant   Telemetry  NSR 80s-90s   Labs    CBC Recent Labs    03/06/21 0500 03/07/21 0326  WBC 7.4 7.5  HGB 10.8* 11.4*  HCT 34.4* 36.1  MCV  85.1 84.7  PLT 141* 423   Basic Metabolic Panel Recent Labs    03/07/21 0326 03/08/21 0129  NA 139 136  K 3.9 3.5  CL 102 100  CO2 26 24  GLUCOSE 134* 179*  BUN 107* 109*  CREATININE 3.25* 3.40*  CALCIUM 9.3 9.1  MG 2.6*  --    Liver Function Tests No results for input(s): AST, ALT, ALKPHOS, BILITOT, PROT, ALBUMIN in the last 72 hours. No results for input(s): LIPASE, AMYLASE in the last 72 hours. Cardiac Enzymes No results for input(s): CKTOTAL, CKMB, CKMBINDEX, TROPONINI in the last 72 hours.  BNP: BNP (last 3 results) Recent Labs    08/21/20 2307 11/08/20 1433 03/05/21 1236  BNP 215.9* 443.2* 584.6*    ProBNP (last 3 results) No results for input(s): PROBNP in the last 8760 hours.   D-Dimer No results for input(s): DDIMER in the last 72 hours. Hemoglobin A1C Recent Labs    03/05/21 1734  HGBA1C 12.2*   Fasting Lipid Panel Recent Labs    03/06/21 0500  CHOL 172  HDL 51  LDLCALC 91  TRIG 150*  CHOLHDL 3.4   Thyroid Function Tests No results for input(s): TSH, T4TOTAL, T3FREE, THYROIDAB in the last 72 hours.  Invalid input(s): FREET3  Other results:   Imaging    No results found.   Medications:     Scheduled Medications:  allopurinol  100 mg Oral BID   aspirin EC  81 mg Oral Q breakfast   atorvastatin  80 mg Oral Daily   carvedilol  12.5 mg Oral BID WC   clopidogrel  75 mg Oral Q breakfast   ferrous sulfate  325 mg Oral BID WC   FLUoxetine  20 mg Oral Daily   heparin injection (subcutaneous)  5,000 Units Subcutaneous Q8H   hydrocortisone cream  1 application Topical BID   insulin aspart  0-15 Units Subcutaneous TID WC   insulin aspart  0-5 Units Subcutaneous QHS   insulin aspart  8 Units Subcutaneous TID WC   insulin glargine-yfgn  40 Units Subcutaneous Daily   levothyroxine  50 mcg Oral QAC breakfast   linagliptin  5 mg Oral Daily   polyethylene glycol  17 g Oral Daily   potassium chloride  40 mEq Oral Q6H   pregabalin  150  mg Oral BID   senna-docusate  1 tablet Oral BID   sodium chloride flush  3 mL Intravenous Q12H   ursodiol  300 mg Oral TID    Infusions:  sodium chloride 10 mL/hr at 03/06/21 1900   cefTRIAXone (ROCEPHIN)  IV 1 g (03/07/21 1351)    PRN Medications: sodium chloride, acetaminophen, albuterol, sodium chloride flush     Assessment/Plan  Chest Pain w/ Elevated HS Trop/ CAD  - known CAD w/ h/o remote MI in 2000 s/p BMS to RCA and again in 2007 w/ BMS to RCA  - mixed typical and atypical symptoms  - HS trop mildly elevated w/ flat trend, 247>>236 >>182>184. Does not suggest an acute event but possible she had event when symptoms first occurred over the weekend. Has new inferior and anterolateral Q waves on ECG that were not present 6 months ago.  HS Trop 182>184 - Would avoid  cath given baseline CKD unless she has refractory angina.  - Echo this admit with decline in EF to 30-35% and new inferior WMA. Suspect progression in RCA disease - Continue plavix and aspirin.  - beta blocker - high-intensity statin   2. Acute on Chronic Diastolic CHF--> Now with reduced EF 30-35%.  - Echo 9/21 EF 55-60%, RV normal --> EF now down 30-35% Grade IIDD - Noncompliance likely contributing to decompensation. Not following fluid/sodium restriction prior to admit. ? Taking medications correctly.   - Volume status difficult to assess due to body habitus. ReDS 31% 10/13. IV lasix discontinued. Is/Os and daily weights. Patient reports weight 264 lb today (not charted), this would be 10 lb weight loss from admit. Will likely need to start po diuretic tomorrow, plan for 80 mg Torsemide BID - Continue carvedilol 12.5 mg twice a day  - GDMT limted with CKD stage IV. No dig, sglt2i, spiro, ARNi with creatinine > 3.  -BP elevated today. Will add hydralazine 10 TID + 15 imdur daily. Check cost of bidil with pharmD  - Limited options.   3. AKI on Stage IV CKD - Baseline sCr ~3.5, > 4 on admit - Scr 3.40 today,  BUN 109 - followed by outpatient nephrology , Dr Hollie Salk  - Check BMET daily    4. Type 2DM, uncontrolled  - poorly controlled, last hgb A1c 12.2 - management per primary team   5. Hypokalemia - K 3.5. Replace  6. Hyponatremia, likely hypervolemic -Resolved  7. History recurrent UTIs -Suspected UTI this admit -On Rocephin -Per triad   FINCH, LINDSAY N, PA-C  03/08/2021, 10:23 AM  Advanced Heart Failure Team Pager [phone] (M-F; 7a - 5p)  Please contact [facility] for night-coverage after hours (5p -7a ) and weekends on [email or web address]   Patient seen and examined with the above-signed Advanced Practice Provider and/or Housestaff. I personally reviewed laboratory data, imaging studies and relevant notes. I independently examined the patient and formulated the important aspects of the plan. I have edited the note to reflect any of my changes or salient points. I have personally discussed the plan with the patient and/or family.  Overall stable. No CP. Volume status looks ok. BP elevated SCr up slightly.   General:  Sitting up in chair No resp difficulty HEENT: normal Neck: supple. no JVD. Carotids 2+ bilat; no bruits. No lymphadenopathy or thryomegaly appreciated. Cor: PMI nondisplaced. Regular rate & rhythm. No rubs, gallops or murmurs. Lungs: clear Abdomen: obese soft, nontender, nondistended. No hepatosplenomegaly. No bruits or masses. Good bowel sounds. Extremities: no cyanosis, clubbing, rash, edema Neuro: alert & orientedx3, cranial nerves grossly intact. moves all 4 extremities w/o difficulty. Affect pleasant  Agree with plan as above. Hold diuretics today. Restart po tomorrow. Start Hydral/Nitrates for BP/HF. (GFR to low for SGLT2i). Continue to manage CAD medically for now with DAPT/statin.   Possibly home tomorrow.   Glori Bickers, MD  5:04 PM

## 2021-03-08 NOTE — Plan of Care (Signed)

## 2021-03-08 NOTE — Progress Notes (Signed)
Pt with loose stools this am. Discussed with Dr. Erlinda Hong and Dr. Hulen Skains (c.diff consultation). Pt does not meet criteria for c.diff screening at this time. Will continue to monitor.   Lenna Sciara, RN, BSN

## 2021-03-09 DIAGNOSIS — I5022 Chronic systolic (congestive) heart failure: Secondary | ICD-10-CM

## 2021-03-09 LAB — BASIC METABOLIC PANEL
Anion gap: 9 (ref 5–15)
BUN: 113 mg/dL — ABNORMAL HIGH (ref 8–23)
CO2: 24 mmol/L (ref 22–32)
Calcium: 9.1 mg/dL (ref 8.9–10.3)
Chloride: 102 mmol/L (ref 98–111)
Creatinine, Ser: 3.59 mg/dL — ABNORMAL HIGH (ref 0.44–1.00)
GFR, Estimated: 13 mL/min — ABNORMAL LOW (ref >60)
Glucose, Bld: 114 mg/dL — ABNORMAL HIGH (ref 70–99)
Potassium: 4.5 mmol/L (ref 3.5–5.1)
Sodium: 135 mmol/L (ref 135–145)

## 2021-03-09 LAB — CBC
HCT: 34.2 % — ABNORMAL LOW (ref 36.0–46.0)
Hemoglobin: 10.6 g/dL — ABNORMAL LOW (ref 12.0–15.0)
MCH: 26.8 pg (ref 26.0–34.0)
MCHC: 31 g/dL (ref 30.0–36.0)
MCV: 86.6 fL (ref 80.0–100.0)
Platelets: 159 10*3/uL (ref 150–400)
RBC: 3.95 MIL/uL (ref 3.87–5.11)
RDW: 16.4 % — ABNORMAL HIGH (ref 11.5–15.5)
WBC: 6.5 10*3/uL (ref 4.0–10.5)
nRBC: 0 % (ref 0.0–0.2)

## 2021-03-09 LAB — GLUCOSE, CAPILLARY
Glucose-Capillary: 176 mg/dL — ABNORMAL HIGH (ref 70–99)
Glucose-Capillary: 150 mg/dL — ABNORMAL HIGH (ref 70–99)
Glucose-Capillary: 265 mg/dL — ABNORMAL HIGH (ref 70–99)
Glucose-Capillary: 298 mg/dL — ABNORMAL HIGH (ref 70–99)

## 2021-03-09 LAB — URINE CULTURE: Culture: >=100000 — AB

## 2021-03-09 MED ORDER — LOPERAMIDE HCL 2 MG PO CAPS
2.0000 mg | ORAL_CAPSULE | ORAL | Status: DC | PRN
  Administered 2021-03-09: 2 mg via ORAL
  Filled 2021-03-09: qty 1

## 2021-03-09 MED ORDER — ALUM & MAG HYDROXIDE-SIMETH 200-200-20 MG/5ML PO SUSP
30.0000 mL | ORAL | Status: DC | PRN
  Administered 2021-03-09: 30 mL via ORAL
  Filled 2021-03-09: qty 30

## 2021-03-09 MED ORDER — SODIUM CHLORIDE 0.9 % IV SOLN
1.0000 g | Freq: Two times a day (BID) | INTRAVENOUS | Status: DC
  Administered 2021-03-10 (×2): 1 g via INTRAVENOUS
  Filled 2021-03-09 (×4): qty 1

## 2021-03-09 NOTE — Progress Notes (Addendum)
PROGRESS NOTE    Susan [name]  EXH:[id number] DOB: 1950-11-14 DOA: 03/05/2021 PCP: [name], MD    No chief complaint on file.   Brief Narrative:  Susan [name] is a 70 y.o. female with medical history significant of CAD with stenting in 3810, chronic diastolic CHF, CKD stage IV, HTN, HLD, PVD, IDDM on insulin pump, presented with worsening of chest pain and shortness of breath.  Found to be in acute heart failure Also have uncontrolled diabetes And possible UTI    Subjective:  Sitting up in chair, denies pain   Assessment & Plan:   Active Problems:   NSTEMI (non-ST elevated myocardial infarction) (HCC)   Non-STEMI On heparin drip and nitro drip Cardiology following, currently no plan for cardiac cath due to [place] Will follow cardiology recommendation  Acute on chronic diastolic CHF Less edema, less sob Management per cardiology Appreciate cardiology input, will follow recommendation  Hypokalemia Replace K  mag 2.6  Insulin-dependent diabetes , Type 2 diabetes Uncontrolled, with hyperglycemia A1c 12.2 Seen by endocrinology Dr [name] in 01/2021 insulin pump was adjusted twice in September She and her husband is not sure about pump setting They agree stop insulin pump here and use insulin longactive plus ssi Appreciate diabetes coordinator input  CKD 4 Bun/cr slowing trending up - will discuss with Nephrology tomorrow   History of recurrent UTI On prophylactic Keflex chronically Report urinary symptom, urine appears cloudy Ua with many bacteria, +leuk  ,started rocephin,  hold keflex, follow up on urine culture result   Constipation Continue MiraLAX, add Senokot Resolved Now having diarrhea Hold stool softener, she did not received any today  On ursodiol chronically   Class III obesity: Body mass index is 45.49 kg/m.Marland Kitchen        Unresulted Labs (From admission, onward)     Start     Ordered   03/06/21 1751  Basic metabolic panel  Daily,    R      03/05/21 1712              DVT prophylaxis: heparin injection 5,000 Units Start: 03/06/21 2200  Currently on heparin drip   Code Status: Full Family Communication: Patient Disposition:   Status is: Inpatient   Dispo: The patient is from: Home              Anticipated d/c is to: home with home health              Anticipated d/c date is: awaiting for urine culture                 Consultants:  Cardiology/heart failure team  Procedures:  None  Antimicrobials:   Anti-infectives (From admission, onward)    Start     Dose/Rate Route Frequency Ordered Stop   03/07/21 1330  cefTRIAXone (ROCEPHIN) 1 g in sodium chloride 0.9 % 100 mL IVPB  Status:  Discontinued        1 g 200 mL/hr over 30 Minutes Intravenous Every 24 hours 03/07/21 1234 03/09/21 1928   03/06/21 1000  cephALEXin (KEFLEX) capsule 250 mg  Status:  Discontinued        250 mg Oral Daily 03/05/21 1802 03/07/21 1233           Objective: Vitals:   03/08/21 1303 03/08/21 2103 03/09/21 0552 03/09/21 1428  BP: 129/64 140/70 (!) 152/70 135/62  Pulse: 80 87 94 79  Resp: 18 18 18 18   Temp: 98.3 F (36.8 C) 97.8 F (36.6 C)  97.8 F (36.6 C) 97.9 F (36.6 C)  TempSrc: Oral Oral Oral Oral  SpO2:  97% 97% 96%  Weight:   124 kg   Height:        Intake/Output Summary (Last 24 hours) at 03/09/2021 1929 Last data filed at 03/09/2021 0552 Gross per 24 hour  Intake --  Output 490 ml  Net -490 ml   Filed Weights   03/06/21 1316 03/08/21 1209 03/09/21 0552  Weight: 124.3 kg 119 kg 124 kg    Examination:  General exam: Obese , weak , chronically ill appearing ,alert, awake, communicative,calm, NAD Respiratory system:  diminished at bases, no wheezing, respiratory effort normal. Cardiovascular system:  RRR.  Gastrointestinal system: Abdomen is nondistended, soft and nontender.  Normal bowel sounds heard. Central nervous system: Alert and oriented. No focal neurological deficits. Extremities:   +edema Skin: No rashes, lesions or ulcers Psychiatry: Judgement and insight appear normal. Mood & affect appropriate.     Data Reviewed: I have personally reviewed following labs and imaging studies  CBC: Recent Labs  Lab 03/05/21 1236 03/06/21 0500 03/07/21 0326 03/09/21 0402  WBC 9.9 7.4 7.5 6.5  HGB 11.4* 10.8* 11.4* 10.6*  HCT 36.2 34.4* 36.1 34.2*  MCV 85.8 85.1 84.7 86.6  PLT 159 141* 158 782    Basic Metabolic Panel: Recent Labs  Lab 03/06/21 0500 03/06/21 1808 03/07/21 0326 03/08/21 0129 03/09/21 0402  NA 134* 136 139 136 135  K 2.9* 4.1 3.9 3.5 4.5  CL 95* 99 102 100 102  CO2 25 26 26 24 24   GLUCOSE 275* 278* 134* 179* 114*  BUN 110* 107* 107* 109* 113*  CREATININE 3.77* 3.38* 3.25* 3.40* 3.59*  CALCIUM 9.0 8.8* 9.3 9.1 9.1  MG  --   --  2.6*  --   --     GFR: Estimated Creatinine Clearance: 19 mL/min (A) (by C-G formula based on SCr of 3.59 mg/dL (H)).  Liver Function Tests: No results for input(s): AST, ALT, ALKPHOS, BILITOT, PROT, ALBUMIN in the last 168 hours.  CBG: Recent Labs  Lab 03/08/21 2118 03/08/21 2211 03/09/21 0809 03/09/21 1113 03/09/21 1633  GLUCAP 81 140* 176* 150* 265*     Recent Results (from the past 240 hour(s))  Resp Panel by RT-PCR (Flu A&B, Covid) Nasopharyngeal Swab     Status: None   Collection Time: 03/05/21 12:29 PM   Specimen: Nasopharyngeal Swab; Nasopharyngeal(NP) swabs in vial transport medium  Result Value Ref Range Status   SARS Coronavirus 2 by RT PCR NEGATIVE NEGATIVE Final    Comment: (NOTE) SARS-CoV-2 target nucleic acids are NOT DETECTED.  The SARS-CoV-2 RNA is generally detectable in upper respiratory specimens during the acute phase of infection. The lowest concentration of SARS-CoV-2 viral copies this assay can detect is 138 copies/mL. A negative result does not preclude SARS-Cov-2 infection and should not be used as the sole basis for treatment or other patient management decisions. A negative  result may occur with  improper specimen collection/handling, submission of specimen other than nasopharyngeal swab, presence of viral mutation(s) within the areas targeted by this assay, and inadequate number of viral copies(<138 copies/mL). A negative result must be combined with clinical observations, patient history, and epidemiological information. The expected result is Negative.  Fact Sheet for Patients:  EntrepreneurPulse.com.au  Fact Sheet for Healthcare Providers:  IncredibleEmployment.be  This test is no t yet approved or cleared by the [place] FDA and  has been authorized for detection and/or diagnosis of SARS-CoV-2 by  FDA under an Emergency Use Authorization (EUA). This EUA will remain  in effect (meaning this test can be used) for the duration of the COVID-19 declaration under Section 564(b)(1) of the Act, 21 U.S.C.section 360bbb-3(b)(1), unless the authorization is terminated  or revoked sooner.       Influenza A by PCR NEGATIVE NEGATIVE Final   Influenza B by PCR NEGATIVE NEGATIVE Final    Comment: (NOTE) The Xpert Xpress SARS-CoV-2/FLU/RSV plus assay is intended as an aid in the diagnosis of influenza from Nasopharyngeal swab specimens and should not be used as a sole basis for treatment. Nasal washings and aspirates are unacceptable for Xpert Xpress SARS-CoV-2/FLU/RSV testing.  Fact Sheet for Patients: EntrepreneurPulse.com.au  Fact Sheet for Healthcare Providers: IncredibleEmployment.be  This test is not yet approved or cleared by the [place] FDA and has been authorized for detection and/or diagnosis of SARS-CoV-2 by FDA under an Emergency Use Authorization (EUA). This EUA will remain in effect (meaning this test can be used) for the duration of the COVID-19 declaration under Section 564(b)(1) of the Act, 21 U.S.C. section 360bbb-3(b)(1), unless the authorization is  terminated or revoked.  Performed at [facility], [place] [address] [place], [place] [address]   Urine Culture     Status: Abnormal   Collection Time: 03/06/21  8:38 AM   Specimen: Urine  Result Value Ref Range Status   MICRO NUMBER: [id number]  Final   SPECIMEN QUALITY: Adequate  Final   Sample Source NOT GIVEN  Final   STATUS: FINAL  Final   ISOLATE 1: Enterobacter cloacae complex (A)  Final    Comment: Greater than 100,000 CFU/mL of Enterobacter cloacae complex      Susceptibility   Enterobacter cloacae complex - URINE CULTURE, REFLEX    AMOX/CLAVULANIC >=32 Resistant     CEFAZOLIN* >=64 Resistant      * For uncomplicated UTI caused by E. coli, K. pneumoniae or P. mirabilis: Cefazolin is susceptible if MIC <32 mcg/mL and predicts susceptible to the oral agents cefaclor, cefdinir, cefpodoxime, cefprozil, cefuroxime, cephalexin and loracarbef.     CEFTAZIDIME >=64 Resistant     CEFEPIME 2 Sensitive     CEFTRIAXONE >=64 Resistant     CIPROFLOXACIN <=0.25 Sensitive     LEVOFLOXACIN <=0.12 Sensitive     GENTAMICIN <=1 Sensitive     IMIPENEM 0.5 Intermediate     NITROFURANTOIN 32 Sensitive     PIP/TAZO >=128 Resistant     TOBRAMYCIN <=1 Sensitive     TRIMETH/SULFA* >=320 Resistant      * For uncomplicated UTI caused by E. coli, K. pneumoniae or P. mirabilis: Cefazolin is susceptible if MIC <32 mcg/mL and predicts susceptible to the oral agents cefaclor, cefdinir, cefpodoxime, cefprozil, cefuroxime, cephalexin and loracarbef. Legend: S = Susceptible  I = Intermediate R = Resistant  NS = Not susceptible * = Not tested  NR = Not reported **NN = See antimicrobic comments   MICROSCOPIC MESSAGE     Status: None   Collection Time: 03/06/21  8:38 AM  Result Value Ref Range Status   Note   Final    Comment: This urine was analyzed for the presence of WBC,  RBC, bacteria, casts, and other formed elements.  Only those elements seen were reported. . .   Urine Culture      Status: Abnormal   Collection Time: 03/06/21  3:57 PM   Specimen: Urine, Clean Catch  Result Value Ref Range Status   Specimen Description URINE, CLEAN  CATCH  Final   Special Requests   Final    NONE Performed at [facility], [place] [address], [place], [place] [address]    Culture >=100,000 COLONIES/mL ENTEROBACTER CLOACAE (A)  Final   Report Status 03/09/2021 FINAL  Final   Organism ID, Bacteria ENTEROBACTER CLOACAE (A)  Final      Susceptibility   Enterobacter cloacae - MIC*    CEFAZOLIN >=64 RESISTANT Resistant     CEFEPIME 4 INTERMEDIATE Intermediate     CIPROFLOXACIN <=0.25 SENSITIVE Sensitive     GENTAMICIN <=1 SENSITIVE Sensitive     IMIPENEM 1 SENSITIVE Sensitive     NITROFURANTOIN 32 SENSITIVE Sensitive     TRIMETH/SULFA >=320 RESISTANT Resistant     PIP/TAZO >=128 RESISTANT Resistant     * >=100,000 COLONIES/mL ENTEROBACTER CLOACAE         Radiology Studies: No results found.      Scheduled Meds:  allopurinol  100 mg Oral BID   aspirin EC  81 mg Oral Q breakfast   atorvastatin  80 mg Oral Daily   carvedilol  12.5 mg Oral BID WC   clopidogrel  75 mg Oral Q breakfast   ferrous sulfate  325 mg Oral BID WC   FLUoxetine  20 mg Oral Daily   heparin injection (subcutaneous)  5,000 Units Subcutaneous Q8H   hydrALAZINE  10 mg Oral Q8H   hydrocortisone cream  1 application Topical BID   insulin aspart  0-15 Units Subcutaneous TID WC   insulin aspart  0-5 Units Subcutaneous QHS   insulin aspart  8 Units Subcutaneous TID WC   insulin glargine-yfgn  40 Units Subcutaneous Daily   isosorbide mononitrate  15 mg Oral Daily   levothyroxine  50 mcg Oral QAC breakfast   linagliptin  5 mg Oral Daily   pregabalin  150 mg Oral BID   sodium chloride flush  3 mL Intravenous Q12H   ursodiol  300 mg Oral TID   Continuous Infusions:  sodium chloride 10 mL/hr at 03/06/21 1900     LOS: 4 days   Time spent: 84mins Greater than 50% of this time was spent in  counseling, explanation of diagnosis, planning of further management, and coordination of care.   Voice Recognition Viviann Spare dictation system was used to create this note, attempts have been made to correct errors. Please contact the author with questions and/or clarifications.   Florencia Reasons, MD PhD FACP [facility]  Available via Epic secure chat 7am-7pm for nonurgent issues Please page for urgent issues To page the attending provider between 7A-7P or the covering provider during after hours 7P-7A, please log into the web site [email or web address] and access using universal [place] password for that web site. If you do not have the password, please call the hospital operator.    03/09/2021, 7:29 PM

## 2021-03-09 NOTE — Progress Notes (Addendum)
Progress Note  Patient Name: Susan [name] Date of Encounter: 03/09/2021  [facility] Cardiologist: Glori Bickers, MD   Subjective   Pt feels well this morning. She states she is having a hard time distinguishing between gas and angina.   Inpatient Medications    Scheduled Meds:  allopurinol  100 mg Oral BID   aspirin EC  81 mg Oral Q breakfast   atorvastatin  80 mg Oral Daily   carvedilol  12.5 mg Oral BID WC   clopidogrel  75 mg Oral Q breakfast   ferrous sulfate  325 mg Oral BID WC   FLUoxetine  20 mg Oral Daily   heparin injection (subcutaneous)  5,000 Units Subcutaneous Q8H   hydrALAZINE  10 mg Oral Q8H   hydrocortisone cream  1 application Topical BID   insulin aspart  0-15 Units Subcutaneous TID WC   insulin aspart  0-5 Units Subcutaneous QHS   insulin aspart  8 Units Subcutaneous TID WC   insulin glargine-yfgn  40 Units Subcutaneous Daily   isosorbide mononitrate  15 mg Oral Daily   levothyroxine  50 mcg Oral QAC breakfast   linagliptin  5 mg Oral Daily   pregabalin  150 mg Oral BID   sodium chloride flush  3 mL Intravenous Q12H   ursodiol  300 mg Oral TID   Continuous Infusions:  sodium chloride 10 mL/hr at 03/06/21 1900   cefTRIAXone (ROCEPHIN)  IV 1 g (03/08/21 1340)   PRN Meds: sodium chloride, acetaminophen, albuterol, sodium chloride flush   Vital Signs    Vitals:   03/08/21 1209 03/08/21 1303 03/08/21 2103 03/09/21 0552  BP:  129/64 140/70 (!) 152/70  Pulse:  80 87 94  Resp:  18 18 18   Temp:  98.3 F (36.8 C) 97.8 F (36.6 C) 97.8 F (36.6 C)  TempSrc:  Oral Oral Oral  SpO2:   97% 97%  Weight: 119 kg   124 kg  Height:        Intake/Output Summary (Last 24 hours) at 03/09/2021 1256 Last data filed at 03/09/2021 0552 Gross per 24 hour  Intake 100 ml  Output 690 ml  Net -590 ml   Last 3 Weights 03/09/2021 03/08/2021 03/06/2021  Weight (lbs) 273 lb 5.9 oz 262 lb 5.6 oz 274 lb  Weight (kg) 124 kg 119 kg 124.286 kg       Telemetry    Sinus in the 70s, 1 bout of ventricular bigeminy, 3-beat NSVT - Personally Reviewed  ECG    No new tracings - Personally Reviewed  Physical Exam   GEN: obese female in no acute distress.   Neck: No JVD Cardiac: RRR, no murmurs, rubs, or gallops.  Respiratory: Clear to auscultation bilaterally. GI: Soft, nontender, non-distended  MS: B LE edema with chronic skin changes Neuro:  Nonfocal  Psych: Normal affect   Labs    High Sensitivity Troponin:   Recent Labs  Lab 03/05/21 1236 03/05/21 1430 03/05/21 1805 03/05/21 2003  TROPONINIHS 247* 236* 182* 184*     Chemistry Recent Labs  Lab 03/07/21 0326 03/08/21 0129 03/09/21 0402  NA 139 136 135  K 3.9 3.5 4.5  CL 102 100 102  CO2 26 24 24   GLUCOSE 134* 179* 114*  BUN 107* 109* 113*  CREATININE 3.25* 3.40* 3.59*  CALCIUM 9.3 9.1 9.1  MG 2.6*  --   --   GFRNONAA 15* 14* 13*  ANIONGAP 11 12 9     Lipids  Recent Labs  Lab  03/06/21 0500  CHOL 172  TRIG 150*  HDL 51  LDLCALC 91  CHOLHDL 3.4    Hematology Recent Labs  Lab 03/06/21 0500 03/07/21 0326 03/09/21 0402  WBC 7.4 7.5 6.5  RBC 4.04 4.26 3.95  HGB 10.8* 11.4* 10.6*  HCT 34.4* 36.1 34.2*  MCV 85.1 84.7 86.6  MCH 26.7 26.8 26.8  MCHC 31.4 31.6 31.0  RDW 15.9* 16.4* 16.4*  PLT 141* 158 159   Thyroid No results for input(s): TSH, FREET4 in the last 168 hours.  BNP Recent Labs  Lab 03/05/21 1236  BNP 584.6*    DDimer No results for input(s): DDIMER in the last 168 hours.   Radiology    No results found.  Cardiac Studies   Echo 03/06/21: 1. Diffuse hypokinesis mid and basal inferior wall akinesis . Left  ventricular ejection fraction, by estimation, is 30 to 35%. The left  ventricle has moderately decreased function. The left ventricle  demonstrates regional wall motion abnormalities (see  scoring diagram/findings for description). The left ventricular internal  cavity size was mildly dilated. Left ventricular diastolic  parameters are  consistent with Grade II diastolic dysfunction (pseudonormalization).  Elevated left ventricular  end-diastolic pressure.   2. Right ventricular systolic function is normal. The right ventricular  size is normal.   3. Left atrial size was severely dilated.   4. Right atrial size was moderately dilated.   5. The mitral valve is abnormal. Trivial mitral valve regurgitation. No  evidence of mitral stenosis. Moderate mitral annular calcification.   6. The aortic valve is tricuspid. Aortic valve regurgitation is not  visualized. Mild to moderate aortic valve sclerosis/calcification is  present, without any evidence of aortic stenosis.   7. The inferior vena cava is normal in size with greater than 50%  respiratory variability, suggesting right atrial pressure of 3 mmHg.   Patient Profile     70 y.o. female with history CAD s/p MI 2000 s/p BMS to RCA and 2007 s/p another BMS to RCA, RBBB, chronic diastolic HF, stage IV CKD. Now admitted with CP with elevated HS troponin, A/C diastolic HF and AKI.  Assessment & Plan    Acute on chronic diastolic heart failure with new reduced LVEF 30-35% - ReDs 31% on 03/07/21 - echo 9/21 with EF 55-60% and normal RV function - echo this admission with new LVEF of 30-35% and grade 2 DD and inferior WMA - noncompliance with medications suspect to be contributory   - hs troponin trend was relatively flat and mild, not suggestive of ACS, had symptoms over the weekend and nwo with new inferior and anterolateral Q waves that were not present 6 months ago - plan to avoid angiography given renal function unless she has refractory symptoms - she was diuresed, but diuretic held yesterday for AonCKD stage IV - Will restart PO 80 mg torsemide BID  - continue ASA, plavix, BB, and statin - GDMT limited by renal function - will avoid digoxin, spiro, SGLT2i, ARNI with creatinine > 3 - started hydralazine 10 mg TID and 15 mg imdur daily - check cost of  bidil OP (was not done here)   Chest pain CAD - with new WMA and reduced EF - new Q waves inferior and anterolateral leads - known CAD with remote MI in 2000 s/p BMS to RCA, repeat BMS to RCA in 2007 - continue ASA and plavix, antianginal regimen increased with low dose imdur - pt had an episode of bigeminy this morning, asymptomatic - Mg  03/07/21 was 2.6   Acute on chronic kidney disease stage IV - baseline creatinine ~ 3.5 - sCr improved today  - following with nephrology (Dr. Hollie Salk)   DM2 with hyperglycemia - A1c 12.2% - likely needs referral to endocrinology   Hx of recurrent UTIs - UA this admission with gram negative rods - management per primary team   Per Dr. Johnsie Cancel, pt may discharge home. I have messaged the AHF team for an appt in the next 1-2 weeks.      For questions or updates, please contact [place] Please consult [email or web address] for contact info under        Signed, Ledora Bottcher, PA  03/09/2021, 12:56 PM    Patient examined chart reviewed She is comfortable and at her baseline sitting in chair. Per note yesterday home dose oral Torsemide 80 mg bid Exam with clear lungs distant heart sound varicosities with plus 2 LE edema remaining With CRF fluid hard to manage and on hydralazine/nitrates instead of ACE/ARNI/ARB  Jenkins Rouge MD [facility]

## 2021-03-09 NOTE — Evaluation (Addendum)
Occupational Therapy Evaluation Patient Details Name: Susan [name] MRN: [id number] DOB: [birth year] Today's Date: 03/09/2021   History of Present Illness 70 y.o. female presented 03/05/21 with worsening of chest pain and shortness of breath. Found to have elevated BP,  O2 saturation borderline hypoxic, x-ray showed bilateral pulmonary congestion.  Troponins 247> 236.  EKG no new ST changes. Admitted for treatment of NSTEMI acute on chronic diastolic CHF, hypokalemia. PMH: CAD with stenting in 2202, chronic diastolic CHF, CKD stage IV, HTN, HLD, PVD, IDDM on insulin pump   Clinical Impression   Prior to hospitalization, pt was living with her spouse in a mobile home with ramp access. Pt's son came over at times but could not provide much S/A 2/2 his own disabilities. Pt was mod I with ADLs/ADL mobility using RW/cane in the house and a scooter in the community. Pt drove and managed her medications independently using a pillbox. Pt is on disability. Pt's husband works most of the time.   Today, pt received sitting on toilet, pt agreeable to OT eval. Pt had large BM (black stool) at beginning of session, OT notified nurse and assisted pt in clean up. Pt required min guard for functional mobility/transfers using RW (+safety cues), min assist-min guard for LB self-care, and min guard-mod I for UB self-care. Pt had 1-2 minor LOB while using RW but able to self-correct. OT educated pt on activity pacing, PLB, safety awareness while using RW, energy conservation, activity pacing/modification, DME (tub transfer bench options, long handled reacher), and process of acute care OT/beyond. See recommendations below, will continue to follow pt in house. Pt reports having PRN S/A from family upon d/c. Pt would benefit from continued skilled acute care OT services to maximize independence with ADLs/ADL mobility.       Recommendations for follow up therapy are one component of a multi-disciplinary discharge planning  process, led by the attending physician.  Recommendations may be updated based on patient status, additional functional criteria and insurance authorization.   Follow Up Recommendations  Home health OT;Supervision/Assistance - 24 hour    Equipment Recommendations  Tub/shower bench, long handled reacher    Recommendations for Other Services Other (comment) (None)     Precautions / Restrictions Precautions Precautions: Fall Restrictions Weight Bearing Restrictions: No      Mobility Bed Mobility Overal bed mobility: Modified Independent          Transfers Overall transfer level: Needs assistance Equipment used: Rolling walker (2 wheeled) Transfers: Sit to/from Stand Sit to Stand: Min guard         General transfer comment: min guard for functional mobility/transfers in room using RW    Balance Overall balance assessment: Needs assistance Sitting-balance support: Feet supported Sitting balance-Leahy Scale: Good     Standing balance support: Bilateral upper extremity supported;During functional activity Standing balance-Leahy Scale: Fair           ADL either performed or assessed with clinical judgement   ADL Overall ADL's : Needs assistance/impaired Eating/Feeding: Independent Eating/Feeding Details (indicate cue type and reason): in chair Grooming: Min guard;Standing Grooming Details (indicate cue type and reason): at sink with RW Upper Body Bathing: Min guard;Sitting;Standing Upper Body Bathing Details (indicate cue type and reason): simulated in sitting/standing with RW Lower Body Bathing: Minimal assistance;Sitting/lateral leans;Sit to/from stand Lower Body Bathing Details (indicate cue type and reason): simulated in sitting/standing with RW Upper Body Dressing : Set up;Sitting Upper Body Dressing Details (indicate cue type and reason): gown management sitting in chair Lower Body  Dressing: Minimal assistance;Sitting/lateral leans;Sit to/from stand Lower  Body Dressing Details (indicate cue type and reason): for sock management sitting on toilet Toilet Transfer: Min guard;Grab bars;RW;Minimal assistance Toilet Transfer Details (indicate cue type and reason): for regular toilet transfer [place] and Hygiene: Minimal assistance;Sitting/lateral lean;Sit to/from stand Toileting - Clothing Manipulation Details (indicate cue type and reason): min assist to help with perineal/perianal care after having large BM in toilet, nurse aware of black looking stool   Tub/Shower Transfer Details (indicate cue type and reason): N/A Functional mobility during ADLs: Min guard;Rolling walker General ADL Comments: cues for safety while using RW; slight LOB 2x but able to recover by herself     Vision Baseline Vision/History: 0 No visual deficits Ability to See in Adequate Light: 0 Adequate Vision Assessment?: No apparent visual deficits     Perception Perception Perception Tested?: No   Praxis Praxis Praxis tested?: Not tested    Pertinent Vitals/Pain Pain Assessment: No/denies pain (just mild stomach ache)     Hand Dominance Right   Extremity/Trunk Assessment Upper Extremity Assessment Upper Extremity Assessment: Overall WFL for tasks assessed   Lower Extremity Assessment Lower Extremity Assessment: Overall WFL for tasks assessed   Cervical / Trunk Assessment Cervical / Trunk Assessment: Kyphotic   Communication Communication Communication: No difficulties   Cognition Arousal/Alertness: Awake/alert Behavior During Therapy: WFL for tasks assessed/performed Overall Cognitive Status: Within Functional Limits for tasks assessed         General Comments  active bleeding to stomach and RLE- nurse aware, edema noted to BLEs and discoloration               Home Living Family/patient expects to be discharged to:: Private residence Living Arrangements: Spouse/significant other Available Help at Discharge:  Family;Available PRN/intermittently;Other (Comment) (husband works during the day, son is available some of the time but has disabilities) Type of Home: Mobile home Home Access: Ramped entrance     [place]: One level     Bathroom Shower/Tub: Tub/shower unit;Door   [employer] Toilet: Handicapped height Bathroom Accessibility: Yes   Home Equipment: Grab bars - tub/shower;Walker - 2 wheels;Walker - 4 wheels;Bedside commode;Cane - quad   Additional Comments: pt reports using RW in home, but will take cane for community distances.      Prior Functioning/Environment Level of Independence: Independent with assistive device(s)        Comments: pt ambulates with RW in house and uses SPC in community, drives, pt uses scooter at grocery store        OT Problem List: Decreased activity tolerance;Impaired balance (sitting and/or standing);Decreased safety awareness;Decreased knowledge of use of DME or AE;Cardiopulmonary status limiting activity;Increased edema      OT Treatment/Interventions: Self-care/ADL training;Therapeutic exercise;Energy conservation;DME and/or AE instruction;Therapeutic activities;Patient/family education    OT Goals(Current goals can be found in the care plan section) Acute Rehab OT Goals Patient Stated Goal: go home OT Goal Formulation: With patient Time For Goal Achievement: 03/23/21 Potential to Achieve Goals: Good  OT Frequency: Min 2X/week    AM-PAC OT "6 Clicks" Daily Activity     Outcome Measure Help from another person eating meals?: None Help from another person taking care of personal grooming?: A Little Help from another person toileting, which includes using toliet, bedpan, or urinal?: A Little Help from another person bathing (including washing, rinsing, drying)?: A Little Help from another person to put on and taking off regular upper body clothing?: A Little Help from another person to put on and taking  off regular lower body clothing?: A  Little 6 Click Score: 19   End of Session Equipment Utilized During Treatment: Gait belt;Rolling walker Nurse Communication: Mobility status  Activity Tolerance: Patient tolerated treatment well Patient left: in chair;with call bell/phone within reach;with nursing/sitter in room  OT Visit Diagnosis: Unsteadiness on feet (R26.81);Muscle weakness (generalized) (M62.81)                Time: 1224-8250 OT Time Calculation (min): 53 min Charges:  OT General Charges $OT Visit: 1 Visit OT Evaluation $OT Eval Moderate Complexity: 1 Mod OT Treatments $Self Care/Home Management : 23-37 mins  Michel Bickers, OTR/L [facility] [phone]   Francesca Jewett 03/09/2021, 4:47 PM

## 2021-03-10 DIAGNOSIS — I5043 Acute on chronic combined systolic (congestive) and diastolic (congestive) heart failure: Secondary | ICD-10-CM

## 2021-03-10 DIAGNOSIS — N184 Chronic kidney disease, stage 4 (severe): Secondary | ICD-10-CM

## 2021-03-10 LAB — BASIC METABOLIC PANEL
Anion gap: 12 (ref 5–15)
BUN: 109 mg/dL — ABNORMAL HIGH (ref 8–23)
CO2: 22 mmol/L (ref 22–32)
Calcium: 9.4 mg/dL (ref 8.9–10.3)
Chloride: 101 mmol/L (ref 98–111)
Creatinine, Ser: 3.25 mg/dL — ABNORMAL HIGH (ref 0.44–1.00)
GFR, Estimated: 15 mL/min — ABNORMAL LOW (ref >60)
Glucose, Bld: 120 mg/dL — ABNORMAL HIGH (ref 70–99)
Potassium: 3.8 mmol/L (ref 3.5–5.1)
Sodium: 135 mmol/L (ref 135–145)

## 2021-03-10 LAB — GLUCOSE, CAPILLARY
Glucose-Capillary: 117 mg/dL — ABNORMAL HIGH (ref 70–99)
Glucose-Capillary: 181 mg/dL — ABNORMAL HIGH (ref 70–99)

## 2021-03-10 LAB — TROPONIN I (HIGH SENSITIVITY)
Troponin I (High Sensitivity): 39 ng/L — ABNORMAL HIGH (ref <18)
Troponin I (High Sensitivity): 42 ng/L — ABNORMAL HIGH (ref <18)

## 2021-03-10 MED ORDER — FOSFOMYCIN TROMETHAMINE 3 G PO PACK
3.0000 g | PACK | ORAL | Status: DC
Start: 2021-03-10 — End: 2021-03-10
  Administered 2021-03-10: 3 g via ORAL
  Filled 2021-03-10 (×2): qty 3

## 2021-03-10 MED ORDER — LINAGLIPTIN 5 MG PO TABS: 5.0000 mg | ORAL_TABLET | Freq: Every day | ORAL | 0 refills | Status: DC

## 2021-03-10 MED ORDER — HYDROCORTISONE 0.5 % EX CREA: 1.0000 "application " | TOPICAL_CREAM | Freq: Two times a day (BID) | CUTANEOUS | 0 refills | Status: DC

## 2021-03-10 MED ORDER — HYDRALAZINE HCL 10 MG PO TABS
10.0000 mg | ORAL_TABLET | Freq: Three times a day (TID) | ORAL | 0 refills | Status: DC
Start: 2021-03-10 — End: 2021-04-01

## 2021-03-10 MED ORDER — FERROUS SULFATE 325 (65 FE) MG PO TABS: 325.0000 mg | ORAL_TABLET | Freq: Every day | ORAL | 2 refills | Status: DC

## 2021-03-10 MED ORDER — ISOSORBIDE MONONITRATE ER 30 MG PO TB24: 15.0000 mg | ORAL_TABLET | Freq: Every day | ORAL | 0 refills | Status: DC

## 2021-03-10 MED ORDER — FOSFOMYCIN TROMETHAMINE 3 G PO PACK: 3.0000 g | PACK | ORAL | 0 refills | Status: AC

## 2021-03-10 MED ORDER — ALLOPURINOL 100 MG PO TABS: 50.0000 mg | ORAL_TABLET | Freq: Every day | ORAL | 0 refills | Status: DC

## 2021-03-10 MED ORDER — MAGNESIUM OXIDE 400 MG PO TABS: 1.0000 | ORAL_TABLET | ORAL | 2 refills | Status: DC

## 2021-03-10 MED ORDER — ATORVASTATIN CALCIUM 80 MG PO TABS: 80.0000 mg | ORAL_TABLET | Freq: Every day | ORAL | 0 refills | Status: DC

## 2021-03-10 NOTE — Progress Notes (Signed)
Progress Note  Patient Name: Susan [name] Date of Encounter: 03/10/2021  [facility] Cardiologist: Glori Bickers, MD   Subjective   Kept yesterday due to urine culture   Inpatient Medications    Scheduled Meds:  allopurinol  100 mg Oral BID   aspirin EC  81 mg Oral Q breakfast   atorvastatin  80 mg Oral Daily   carvedilol  12.5 mg Oral BID WC   clopidogrel  75 mg Oral Q breakfast   ferrous sulfate  325 mg Oral BID WC   FLUoxetine  20 mg Oral Daily   heparin injection (subcutaneous)  5,000 Units Subcutaneous Q8H   hydrALAZINE  10 mg Oral Q8H   hydrocortisone cream  1 application Topical BID   insulin aspart  0-15 Units Subcutaneous TID WC   insulin aspart  0-5 Units Subcutaneous QHS   insulin aspart  8 Units Subcutaneous TID WC   insulin glargine-yfgn  40 Units Subcutaneous Daily   isosorbide mononitrate  15 mg Oral Daily   levothyroxine  50 mcg Oral QAC breakfast   linagliptin  5 mg Oral Daily   pregabalin  150 mg Oral BID   sodium chloride flush  3 mL Intravenous Q12H   ursodiol  300 mg Oral TID   Continuous Infusions:  sodium chloride 250 mL (03/10/21 0022)   meropenem (MERREM) IV 1 g (03/10/21 0024)   PRN Meds: sodium chloride, acetaminophen, albuterol, alum & mag hydroxide-simeth, loperamide, sodium chloride flush   Vital Signs    Vitals:   03/09/21 1944 03/09/21 2214 03/10/21 0314 03/10/21 0656  BP:  (!) 143/69 128/61   Pulse:  82 81   Resp: 19  20   Temp:   97.9 F (36.6 C)   TempSrc:   Oral   SpO2:  (!) 81% 96%   Weight:    122.1 kg  Height:        Intake/Output Summary (Last 24 hours) at 03/10/2021 0925 Last data filed at 03/10/2021 0656 Gross per 24 hour  Intake --  Output 1325 ml  Net -1325 ml   Last 3 Weights 03/10/2021 03/09/2021 03/08/2021  Weight (lbs) 269 lb 2.9 oz 273 lb 5.9 oz 262 lb 5.6 oz  Weight (kg) 122.1 kg 124 kg 119 kg      Telemetry    Sinus in the 70s, 1 bout of ventricular bigeminy, 3-beat NSVT - Personally  Reviewed  ECG    No new tracings - Personally Reviewed  Physical Exam   GEN: obese female in no acute distress.   Neck: No JVD Cardiac: RRR, no murmurs, rubs, or gallops.  Respiratory: Clear to auscultation bilaterally. GI: Soft, nontender, non-distended  LE edema bilaterally with varicosities  Labs    High Sensitivity Troponin:   Recent Labs  Lab 03/05/21 1430 03/05/21 1805 03/05/21 2003 03/10/21 0218 03/10/21 0554  TROPONINIHS 236* 182* 184* 39* 42*     Chemistry Recent Labs  Lab 03/07/21 0326 03/08/21 0129 03/09/21 0402 03/10/21 0218  NA 139 136 135 135  K 3.9 3.5 4.5 3.8  CL 102 100 102 101  CO2 26 24 24 22   GLUCOSE 134* 179* 114* 120*  BUN 107* 109* 113* 109*  CREATININE 3.25* 3.40* 3.59* 3.25*  CALCIUM 9.3 9.1 9.1 9.4  MG 2.6*  --   --   --   GFRNONAA 15* 14* 13* 15*  ANIONGAP 11 12 9 12     Lipids  Recent Labs  Lab 03/06/21 0500  CHOL 172  TRIG  150*  HDL 51  LDLCALC 91  CHOLHDL 3.4    Hematology Recent Labs  Lab 03/06/21 0500 03/07/21 0326 03/09/21 0402  WBC 7.4 7.5 6.5  RBC 4.04 4.26 3.95  HGB 10.8* 11.4* 10.6*  HCT 34.4* 36.1 34.2*  MCV 85.1 84.7 86.6  MCH 26.7 26.8 26.8  MCHC 31.4 31.6 31.0  RDW 15.9* 16.4* 16.4*  PLT 141* 158 159   Thyroid No results for input(s): TSH, FREET4 in the last 168 hours.  BNP Recent Labs  Lab 03/05/21 1236  BNP 584.6*    DDimer No results for input(s): DDIMER in the last 168 hours.   Radiology    No results found.  Cardiac Studies   Echo 03/06/21: 1. Diffuse hypokinesis mid and basal inferior wall akinesis . Left  ventricular ejection fraction, by estimation, is 30 to 35%. The left  ventricle has moderately decreased function. The left ventricle  demonstrates regional wall motion abnormalities (see  scoring diagram/findings for description). The left ventricular internal  cavity size was mildly dilated. Left ventricular diastolic parameters are  consistent with Grade II diastolic  dysfunction (pseudonormalization).  Elevated left ventricular  end-diastolic pressure.   2. Right ventricular systolic function is normal. The right ventricular  size is normal.   3. Left atrial size was severely dilated.   4. Right atrial size was moderately dilated.   5. The mitral valve is abnormal. Trivial mitral valve regurgitation. No  evidence of mitral stenosis. Moderate mitral annular calcification.   6. The aortic valve is tricuspid. Aortic valve regurgitation is not  visualized. Mild to moderate aortic valve sclerosis/calcification is  present, without any evidence of aortic stenosis.   7. The inferior vena cava is normal in size with greater than 50%  respiratory variability, suggesting right atrial pressure of 3 mmHg.   Patient Profile     70 y.o. female with history CAD s/p MI 2000 s/p BMS to RCA and 2007 s/p another BMS to RCA, RBBB, chronic diastolic HF, stage IV CKD. Now admitted with CP with elevated HS troponin, A/C diastolic HF and AKI.  Assessment & Plan    Acute on chronic diastolic heart failure with new reduced LVEF 30-35% - ReDs 31% on 03/07/21 - echo 9/21 with EF 55-60% and normal RV function - echo this admission with new LVEF of 30-35% and grade 2 DD and inferior WMA - noncompliance with medications suspect to be contributory   - hs troponin trend was relatively flat and mild, not suggestive of ACS, had symptoms over the weekend and nwo with new inferior and anterolateral Q waves that were not present 6 months ago - plan to avoid angiography given renal function unless she has refractory symptoms - she was diuresed, but diuretic held yesterday for AonCKD stage IV - PO 80 mg torsemide BID  - continue ASA, plavix, BB, and statin - GDMT limited by renal function - will avoid digoxin, spiro, SGLT2i, ARNI with creatinine > 3 - started hydralazine 10 mg TID and 15 mg imdur daily - check cost of bidil OP (was not done here)   Chest pain CAD - with new WMA  and reduced EF - new Q waves inferior and anterolateral leads - known CAD with remote MI in 2000 s/p BMS to RCA, repeat BMS to RCA in 2007 - continue ASA and plavix, antianginal regimen increased with low dose imdur - pt had an episode of bigeminy this morning, asymptomatic - Mg 03/07/21 was 2.6   Acute on chronic kidney  disease stage IV - baseline creatinine ~ 3.5 - sCr improved today 3.25  - following with nephrology (Dr. Hollie Salk)   DM2 with hyperglycemia - A1c 12.2% - likely needs referral to endocrinology   Hx of recurrent UTIs - Culture with > 100,000 enterobacter cloacae On Meropenem per primary service     I have messaged the AHF team for an appt in the next 1-2 weeks.  Cardiology will sign off      For questions or updates, please contact [place] Please consult [email or web address] for contact info under        Signed, Jenkins Rouge, MD  03/10/2021, 9:25 AM

## 2021-03-10 NOTE — Discharge Summary (Signed)
Discharge Summary  Susan [name] TLX:[id number] DOB: Apr 16, 1951  PCP: [name], MD  Admit date: 03/05/2021 Discharge date: 03/10/2021  Time spent: 60mins, more than 50% time spent on coordination of care.   Recommendations for Outpatient Follow-up:  F/u with PCP within a week  for hospital discharge follow up, repeat cbc/bmp at follow up F/u with cardiology F/u with nephrology Home health/DME order placed    Discharge Diagnoses:  Active Hospital Problems   Diagnosis Date Noted   NSTEMI (non-ST elevated myocardial infarction) ([place]) 03/05/2021    Resolved Hospital Problems  No resolved problems to display.    Discharge Condition: stable  Diet recommendation: renal diet Clementeen Graham modified  Filed Weights   03/08/21 1209 03/09/21 0552 03/10/21 0656  Weight: 119 kg 124 kg 122.1 kg    History of present illness: ( per admitting MD Dr Roosevelt Locks) Chief Complaint: Chest pain, SOB   HPI: Susan [name] is a 70 y.o. female with medical history significant of CAD with stenting in 7416, chronic diastolic CHF, CKD stage IV, HTN, HLD, PVD, IDDM on insulin pump, presented with worsening of chest pain and shortness of breath.   Patient started to have dry cough and shortness of breath since Friday.  Denies any fever chills.  Then Saturday, she developed pressure-like chest pain, episodic, centrally located, along with worsening of shortness of breath. She took nitroglycerin 2 times Saturday and 3 times on Sunday, despite, she continued to experience chest pains yesterday and worsening of shortness of breath and leg swelling.   She also states that recently fingerstick> 500, her endocrinologist has increased her insulin pump setting several times recently.   ED Course: Patient was found to have elevated blood pressure, O2 saturation borderline hypoxic, x-ray showed bilateral pulmonary congestion.  Troponins 247> 236.  EKG no new ST changes.   Heparin drip started in the ED as well as 1 dose  of IV Lasix.    Hospital Course:  Active Problems:   NSTEMI (non-ST elevated myocardial infarction) ([place])   Non-STEMI known CAD w/ h/o remote MI in 2000 s/p BMS to RCA and again in 2007 w/ BMS to RCA  Initially treated with heparin drip and nitro drip Cardiology consulted, no plan for cardiac cath due to CKDIV, recommend medical management Continue plavix and aspirin,  beta blocker,  high-intensity statin Chest pain resolved, She is cleared to discharge home by cardiology   Acute on chronic diastolic CHF, new reduced EF 30-35% , suspect from progressive CAD Initially received iv lasix 120mg  bid, diuresis held on 10/15 due to rising cr, cr improved, start back on torsemide Continue carvedilol 12.5 mg twice a day  - GDMT limted with CKD stage IV. No dig, sglt2i, spiro, ARNi with creatinine > 3.  Less edema, less sob, reports feeling better, she desires to go home, she is cleared to discharge home by cardiology    Hypokalemia Replaced, k wnl at discharge   mag 2.6   Insulin-dependent diabetes , Type 2 diabetes Uncontrolled, with hyperglycemia A1c 12.2 Seen by endocrinology Dr [name] in 01/2021 insulin pump was adjusted twice in September She and her husband is not sure about pump setting They agree stop insulin pump  and use insulin long acting / plus ssi Seen by diabetes coordinator here Return on insulin pump at discharge   CKD 4 Bun/cr peaked at 113/3.59, trending down at discharge - she is advised to f/u with nephrology  -renal dosing meds      History of recurrent UTI  On prophylactic Keflex chronically Report dysuria, urine appears cloudy on admission Ua culture with drug resistant enterobacter cloacae ( resistent to rocephin)  she received two doses of imipenem in the hospital , case discussed with ID pharmacist who recommend fosphamycin 3g q3d for three doses Prophylactic keflex discontinued as bacteria is tested resistant to keflex   Constipation Continue MiraLAX,  add Senokot Resolved    On ursodiol chronically     Class III obesity: Body mass index is 45.49 kg/m.  advise life style changes        Procedures: none  Consultations: Cardiology ID pharmacist   Discharge Exam: BP 128/61 (BP Location: Right Arm)   Pulse 81   Temp 97.9 F (36.6 C) (Oral)   Resp 20   Ht 5\' 5"  (1.651 m)   Wt 122.1 kg   SpO2 96%   BMI 44.79 kg/m   General: NAD, pleasant, Cardiovascular: RRR Respiratory: normal respiratory effort   Discharge Instructions You were cared for by a hospitalist during your hospital stay. If you have any questions about your discharge medications or the care you received while you were in the hospital after you are discharged, you can call the unit and asked to speak with the hospitalist on call if the hospitalist that took care of you is not available. Once you are discharged, your primary care physician will handle any further medical issues. Please note that NO REFILLS for any discharge medications will be authorized once you are discharged, as it is imperative that you return to your primary care physician (or establish a relationship with a primary care physician if you do not have one) for your aftercare needs so that they can reassess your need for medications and monitor your lab values.  Discharge Instructions     Diet - low sodium heart healthy   Complete by: As directed    Carb modified diet   Increase activity slowly   Complete by: As directed       Allergies as of 03/10/2021       Reactions   Drug Ingredient [cephalexin] Diarrhea   Pt reports heavy diarrhea with use of keflex   Codeine Nausea And Vomiting        Medication List     STOP taking these medications    cephALEXin 250 MG capsule Commonly known as: KEFLEX   pregabalin 150 MG capsule Commonly known as: Lyrica   traZODone 100 MG tablet Commonly known as: DESYREL       TAKE these medications    acetaminophen 500 MG  tablet Commonly known as: TYLENOL Take 1,000 mg by mouth every 6 (six) hours as needed for moderate pain or headache.   AIRBORNE GUMMIES PO Take 2 tablets by mouth every evening.   Centrum Silver 50+Women Tabs Take 1 tablet by mouth every evening.   albuterol 108 (90 Base) MCG/ACT inhaler Commonly known as: VENTOLIN HFA TAKE 2 PUFFS BY MOUTH EVERY 6 HOURS AS NEEDED FOR WHEEZE OR SHORTNESS OF BREATH What changed: See the new instructions.   allopurinol 100 MG tablet Commonly known as: Zyloprim Take 0.5 tablets (50 mg total) by mouth daily. What changed:  how much to take when to take this   aspirin EC 81 MG tablet Take 1 tablet (81 mg total) by mouth daily with breakfast.   atorvastatin 80 MG tablet Commonly known as: LIPITOR Take 1 tablet (80 mg total) by mouth daily. Start taking on: March 11, 2021   carvedilol 12.5 MG tablet Commonly  known as: COREG TAKE 1 TABLET (12.5 MG TOTAL) BY MOUTH 2 (TWO) TIMES DAILY WITH A MEAL.   cholecalciferol 1000 units tablet Commonly known as: VITAMIN D Take 1,000 Units by mouth daily with supper.   clopidogrel 75 MG tablet Commonly known as: PLAVIX TAKE 1 TABLET BY MOUTH DAILY WITH BREAKFAST. What changed: when to take this   Cranberry 250 MG Tabs Take 500 mg by mouth daily.   ferrous sulfate 325 (65 FE) MG tablet Take 1 tablet (325 mg total) by mouth daily with breakfast. Reported on 08/21/2015 What changed: when to take this   FLUoxetine 20 MG capsule Commonly known as: PROZAC TAKE 1 CAPSULE BY MOUTH EVERY DAY What changed: how much to take   fosfomycin 3 g Pack Commonly known as: MONUROL Take 3 g by mouth every 3 (three) days for 2 doses. Start taking on: March 13, 2021   guaiFENesin-dextromethorphan 100-10 MG/5ML syrup Commonly known as: ROBITUSSIN DM Take 10 mLs by mouth every 4 (four) hours as needed for cough.   hydrALAZINE 10 MG tablet Commonly known as: APRESOLINE Take 1 tablet (10 mg total) by mouth every  8 (eight) hours.   hydrocortisone cream 0.5 % Apply 1 application topically 2 (two) times daily.   insulin NPH Human 100 UNIT/ML injection Commonly known as: NOVOLIN N Inject 15 Units into the skin See admin instructions. Inject 15 units subcutaneously in the morning if CBG >300;   insulin regular 100 units/mL injection Commonly known as: NOVOLIN R Inject into the skin See admin instructions. Manually add bolus to continuous dose via OmniPod 3 times daily per sliding scale (CBG 80-150 7 units, 151-200 9 units, 201-250 12 units, 251-300 14 units, 301- 400 17 units)   isosorbide mononitrate 30 MG 24 hr tablet Commonly known as: IMDUR Take 0.5 tablets (15 mg total) by mouth daily. Start taking on: March 11, 2021   levothyroxine 50 MCG tablet Commonly known as: SYNTHROID Take 1 tablet (50 mcg total) by mouth daily before breakfast.   linagliptin 5 MG Tabs tablet Commonly known as: TRADJENTA Take 1 tablet (5 mg total) by mouth daily. Start taking on: March 11, 2021   magnesium oxide 400 MG tablet Commonly known as: MAG-OX Take 1 tablet (400 mg total) by mouth every Monday, Wednesday, and Friday. Start taking on: March 11, 2021 What changed: when to take this   nitroGLYCERIN 0.4 MG SL tablet Commonly known as: NITROSTAT PLACE 1 TABLET (0.4 MG TOTAL) UNDER THE TONGUE EVERY 5 (FIVE) MINUTES AS NEEDED FOR CHEST PAIN.   Omnipod DASH Pods (Gen 4) Misc Inject into the skin See admin instructions. Use continuously with Novolin R - change every 72 hours.   OneTouch Verio test strip Generic drug: glucose blood 3 (three) times daily.   polyethylene glycol 17 g packet Commonly known as: MIRALAX / GLYCOLAX Take 17 g by mouth daily. What changed:  when to take this reasons to take this   torsemide 20 MG tablet Commonly known as: DEMADEX Take 80 mg by mouth 2 (two) times daily.   ursodiol 500 MG tablet Commonly known as: ACTIGALL Take 500 mg by mouth 3 (three) times  daily.   vitamin B-12 1000 MCG tablet Commonly known as: CYANOCOBALAMIN Take 1,000 mcg by mouth daily.   vitamin C 1000 MG tablet Take 1,000 mg by mouth daily.   zinc gluconate 50 MG tablet Take 50 mg by mouth daily.  Durable Medical Equipment  (From admission, onward)           Start     Ordered   03/09/21 1928  For home use only DME Shower stool  Once       Comments: Tub/shower bench, long handled reacher   03/09/21 1927           Allergies  Allergen Reactions   Drug Ingredient [Cephalexin] Diarrhea    Pt reports heavy diarrhea with use of keflex   Codeine Nausea And Vomiting    Follow-up Information     [facility] Follow up on 03/19/2021.   Specialty: Cardiology Why: [facility] at [facility] at 10 am Entrance C, Garage Code 3333 Contact information: [address] [id number] [place]        [name], MD Follow up in 1 week(s).   Specialty: Family Medicine Why: hospital discharge follow up, repeat cbc/bmp at follow up Contact information: [place] [address] [phone]         Bensimhon, Shaune Pascal, MD .   Specialty: Cardiology Contact information: [place] [address] [phone]         [facility], [place] Follow up in 1 month(s).   Why: f/u with your kidney doctor Contact information: [place]  [address] [phone]                  The results of significant diagnostics from this hospitalization (including imaging, microbiology, ancillary and laboratory) are listed below for reference.    Significant Diagnostic Studies: DG Chest 2 View  Result Date: 03/05/2021 CLINICAL DATA:  Cough, shortness of breath EXAM: CHEST - 2 VIEW COMPARISON:  Chest radiograph 08/24/2020 FINDINGS: The cardiomediastinal silhouette is  within normal limits. There are increased interstitial markings throughout both lungs. There are trace bilateral pleural effusions. There is no pneumothorax. There is no acute osseous abnormality.  Sh IMPRESSION: Suspect mild pulmonary interstitial edema with trace pleural effusions. Electronically Signed   By: Valetta Mole M.D.   On: 03/05/2021 13:17   ECHOCARDIOGRAM COMPLETE  Result Date: 03/06/2021    ECHOCARDIOGRAM REPORT   Patient Name:   DEONDRIA PURYEAR Date of Exam: 03/06/2021 Medical Rec #:  [id number]    Height:       65.0 in Accession #:    [id number]   Weight:       274.0 lb Date of Birth:  [birth year]    BSA:          2.261 m Patient Age:    29 years     BP:           126/69 mmHg Patient Gender: F            HR:           92 bpm. Exam Location:  Inpatient Procedure: 2D Echo Indications:    NSTEMI  History:        Patient has prior history of Echocardiogram examinations. CHF;                 Acute MI.  Sonographer:    DBB Referring Phys: [place]  1. Diffuse hypokinesis mid and basal inferior wall akinesis . Left ventricular ejection fraction, by estimation, is 30 to 35%. The left ventricle has moderately decreased function. The left ventricle demonstrates regional wall motion abnormalities (see  scoring diagram/findings for description). The left ventricular internal cavity size was mildly dilated. Left ventricular diastolic parameters are consistent with Grade II diastolic dysfunction (pseudonormalization). Elevated left ventricular end-diastolic pressure.  2. Right ventricular systolic function is normal. The right ventricular size is normal.  3. Left atrial size was severely dilated.  4. Right atrial size was moderately dilated.  5. The mitral valve is abnormal. Trivial mitral valve regurgitation. No evidence of mitral stenosis. Moderate mitral annular calcification.  6. The aortic valve is tricuspid. Aortic valve regurgitation is not visualized. Mild to moderate aortic  valve sclerosis/calcification is present, without any evidence of aortic stenosis.  7. The inferior vena cava is normal in size with greater than 50% respiratory variability, suggesting right atrial pressure of 3 mmHg. FINDINGS  Left Ventricle: Diffuse hypokinesis mid and basal inferior wall akinesis. Left ventricular ejection fraction, by estimation, is 30 to 35%. The left ventricle has moderately decreased function. The left ventricle demonstrates regional wall motion abnormalities. The left ventricular internal cavity size was mildly dilated. There is no left ventricular hypertrophy. Left ventricular diastolic parameters are consistent with Grade II diastolic dysfunction (pseudonormalization). Elevated left ventricular end-diastolic pressure. Right Ventricle: The right ventricular size is normal. No increase in right ventricular wall thickness. Right ventricular systolic function is normal. Left Atrium: Left atrial size was severely dilated. Right Atrium: Right atrial size was moderately dilated. Pericardium: There is no evidence of pericardial effusion. Mitral Valve: The mitral valve is abnormal. There is mild thickening of the mitral valve leaflet(s). There is mild calcification of the mitral valve leaflet(s). Moderate mitral annular calcification. Trivial mitral valve regurgitation. No evidence of mitral valve stenosis. MV peak gradient, 7.6 mmHg. The mean mitral valve gradient is 4.0 mmHg. Tricuspid Valve: The tricuspid valve is normal in structure. Tricuspid valve regurgitation is mild . No evidence of tricuspid stenosis. Aortic Valve: The aortic valve is tricuspid. Aortic valve regurgitation is not visualized. Mild to moderate aortic valve sclerosis/calcification is present, without any evidence of aortic stenosis. Aortic valve mean gradient measures 5.0 mmHg. Aortic valve peak gradient measures 9.4 mmHg. Aortic valve area, by VTI measures 2.07 cm. Pulmonic Valve: The pulmonic valve was normal in  structure. Pulmonic valve regurgitation is not visualized. No evidence of pulmonic stenosis. Aorta: The aortic root is normal in size and structure. Venous: The inferior vena cava is normal in size with greater than 50% respiratory variability, suggesting right atrial pressure of 3 mmHg. IAS/Shunts: No atrial level shunt detected by color flow Doppler.  LEFT VENTRICLE PLAX 2D LVIDd:         5.50 cm   Diastology LVIDs:         3.60 cm   LV e' medial:    3.78 cm/s LV PW:         1.10 cm   LV E/e' medial:  32.3 LV IVS:        1.00 cm   LV e' lateral:   6.73 cm/s LVOT diam:     1.90 cm   LV E/e' lateral: 18.1 LV SV:         75 LV SV Index:   33 LVOT Area:     2.84 cm  RIGHT VENTRICLE RV Basal diam:  4.00 cm RV Mid diam:    3.60 cm RV S prime:     12.60 cm/s TAPSE (M-mode): 3.1 cm LEFT ATRIUM             Index        RIGHT  ATRIUM           Index LA diam:        5.00 cm 2.21 cm/m   RA Area:     20.60 cm LA Vol (A2C):   65.8 ml 29.10 ml/m  RA Volume:   58.50 ml  25.87 ml/m LA Vol (A4C):   83.7 ml 37.01 ml/m LA Biplane Vol: 79.6 ml 35.20 ml/m  AORTIC VALVE                     PULMONIC VALVE AV Area (Vmax):    2.11 cm      PV Vmax:       0.94 m/s AV Area (Vmean):   2.13 cm      PV Peak grad:  3.6 mmHg AV Area (VTI):     2.07 cm AV Vmax:           153.00 cm/s AV Vmean:          110.000 cm/s AV VTI:            0.365 m AV Peak Grad:      9.4 mmHg AV Mean Grad:      5.0 mmHg LVOT Vmax:         114.00 cm/s LVOT Vmean:        82.500 cm/s LVOT VTI:          0.266 m LVOT/AV VTI ratio: 0.73 MITRAL VALVE                TRICUSPID VALVE MV Area (PHT): 4.83 cm     TR Peak grad:   37.2 mmHg MV Area VTI:   2.24 cm     TR Vmax:        305.00 cm/s MV Peak grad:  7.6 mmHg MV Mean grad:  4.0 mmHg     SHUNTS MV Vmax:       1.38 m/s     Systemic VTI:  0.27 m MV Vmean:      93.2 cm/s    Systemic Diam: 1.90 cm MV Decel Time: 157 msec MV E velocity: 122.00 cm/s MV A velocity: 84.40 cm/s MV E/A ratio:  1.45 Jenkins Rouge MD Electronically  signed by Jenkins Rouge MD Signature Date/Time: 03/06/2021/5:48:19 PM    Final     Microbiology: Recent Results (from the past 240 hour(s))  Resp Panel by RT-PCR (Flu A&B, Covid) Nasopharyngeal Swab     Status: None   Collection Time: 03/05/21 12:29 PM   Specimen: Nasopharyngeal Swab; Nasopharyngeal(NP) swabs in vial transport medium  Result Value Ref Range Status   SARS Coronavirus 2 by RT PCR NEGATIVE NEGATIVE Final    Comment: (NOTE) SARS-CoV-2 target nucleic acids are NOT DETECTED.  The SARS-CoV-2 RNA is generally detectable in upper respiratory specimens during the acute phase of infection. The lowest concentration of SARS-CoV-2 viral copies this assay can detect is 138 copies/mL. A negative result does not preclude SARS-Cov-2 infection and should not be used as the sole basis for treatment or other patient management decisions. A negative result may occur with  improper specimen collection/handling, submission of specimen other than nasopharyngeal swab, presence of viral mutation(s) within the areas targeted by this assay, and inadequate number of viral copies(<138 copies/mL). A negative result must be combined with clinical observations, patient history, and epidemiological information. The expected result is Negative.  Fact Sheet for Patients:  EntrepreneurPulse.com.au  Fact Sheet for Healthcare Providers:  IncredibleEmployment.be  This test is no t yet  approved or cleared by the [place] and  has been authorized for detection and/or diagnosis of SARS-CoV-2 by FDA under an Emergency Use Authorization (EUA). This EUA will remain  in effect (meaning this test can be used) for the duration of the COVID-19 declaration under Section 564(b)(1) of the Act, 21 U.S.C.section 360bbb-3(b)(1), unless the authorization is terminated  or revoked sooner.       Influenza A by PCR NEGATIVE NEGATIVE Final   Influenza B by PCR NEGATIVE  NEGATIVE Final    Comment: (NOTE) The Xpert Xpress SARS-CoV-2/FLU/RSV plus assay is intended as an aid in the diagnosis of influenza from Nasopharyngeal swab specimens and should not be used as a sole basis for treatment. Nasal washings and aspirates are unacceptable for Xpert Xpress SARS-CoV-2/FLU/RSV testing.  Fact Sheet for Patients: EntrepreneurPulse.com.au  Fact Sheet for Healthcare Providers: IncredibleEmployment.be  This test is not yet approved or cleared by the [place] FDA and has been authorized for detection and/or diagnosis of SARS-CoV-2 by FDA under an Emergency Use Authorization (EUA). This EUA will remain in effect (meaning this test can be used) for the duration of the COVID-19 declaration under Section 564(b)(1) of the Act, 21 U.S.C. section 360bbb-3(b)(1), unless the authorization is terminated or revoked.  Performed at [facility], [place] [address] [place], [place] [address]   Urine Culture     Status: Abnormal   Collection Time: 03/06/21  8:38 AM   Specimen: Urine  Result Value Ref Range Status   MICRO NUMBER: [id number]  Final   SPECIMEN QUALITY: Adequate  Final   Sample Source NOT GIVEN  Final   STATUS: FINAL  Final   ISOLATE 1: Enterobacter cloacae complex (A)  Final    Comment: Greater than 100,000 CFU/mL of Enterobacter cloacae complex      Susceptibility   Enterobacter cloacae complex - URINE CULTURE, REFLEX    AMOX/CLAVULANIC >=32 Resistant     CEFAZOLIN* >=64 Resistant      * For uncomplicated UTI caused by E. coli, K. pneumoniae or P. mirabilis: Cefazolin is susceptible if MIC <32 mcg/mL and predicts susceptible to the oral agents cefaclor, cefdinir, cefpodoxime, cefprozil, cefuroxime, cephalexin and loracarbef.     CEFTAZIDIME >=64 Resistant     CEFEPIME 2 Sensitive     CEFTRIAXONE >=64 Resistant     CIPROFLOXACIN <=0.25 Sensitive     LEVOFLOXACIN <=0.12 Sensitive     GENTAMICIN <=1 Sensitive      IMIPENEM 0.5 Intermediate     NITROFURANTOIN 32 Sensitive     PIP/TAZO >=128 Resistant     TOBRAMYCIN <=1 Sensitive     TRIMETH/SULFA* >=320 Resistant      * For uncomplicated UTI caused by E. coli, K. pneumoniae or P. mirabilis: Cefazolin is susceptible if MIC <32 mcg/mL and predicts susceptible to the oral agents cefaclor, cefdinir, cefpodoxime, cefprozil, cefuroxime, cephalexin and loracarbef. Legend: S = Susceptible  I = Intermediate R = Resistant  NS = Not susceptible * = Not tested  NR = Not reported **NN = See antimicrobic comments   MICROSCOPIC MESSAGE     Status: None   Collection Time: 03/06/21  8:38 AM  Result Value Ref Range Status   Note   Final    Comment: This urine was analyzed for the presence of WBC,  RBC, bacteria, casts, and other formed elements.  Only those elements seen were reported. . .   Urine Culture     Status: Abnormal   Collection Time: 03/06/21  3:57 PM   Specimen: Urine, Clean Catch  Result Value Ref Range Status   Specimen Description URINE, CLEAN CATCH  Final   Special Requests   Final    NONE Performed at [facility], [address] [place], [place] [address]    Culture >=100,000 COLONIES/mL ENTEROBACTER CLOACAE (A)  Final   Report Status 03/09/2021 FINAL  Final   Organism ID, Bacteria ENTEROBACTER CLOACAE (A)  Final      Susceptibility   Enterobacter cloacae - MIC*    CEFAZOLIN >=64 RESISTANT Resistant     CEFEPIME 4 INTERMEDIATE Intermediate     CIPROFLOXACIN <=0.25 SENSITIVE Sensitive     GENTAMICIN <=1 SENSITIVE Sensitive     IMIPENEM 1 SENSITIVE Sensitive     NITROFURANTOIN 32 SENSITIVE Sensitive     TRIMETH/SULFA >=320 RESISTANT Resistant     PIP/TAZO >=128 RESISTANT Resistant     * >=100,000 COLONIES/mL ENTEROBACTER CLOACAE     Labs: Basic Metabolic Panel: Recent Labs  Lab 03/06/21 1808 03/07/21 0326 03/08/21 0129 03/09/21 0402 03/10/21 0218  NA 136 139 136 135 135  K 4.1 3.9 3.5 4.5 3.8  CL 99 102  100 102 101  CO2 26 26 24 24 22   GLUCOSE 278* 134* 179* 114* 120*  BUN 107* 107* 109* 113* 109*  CREATININE 3.38* 3.25* 3.40* 3.59* 3.25*  CALCIUM 8.8* 9.3 9.1 9.1 9.4  MG  --  2.6*  --   --   --    Liver Function Tests: No results for input(s): AST, ALT, ALKPHOS, BILITOT, PROT, ALBUMIN in the last 168 hours. No results for input(s): LIPASE, AMYLASE in the last 168 hours. No results for input(s): AMMONIA in the last 168 hours. CBC: Recent Labs  Lab 03/05/21 1236 03/06/21 0500 03/07/21 0326 03/09/21 0402  WBC 9.9 7.4 7.5 6.5  HGB 11.4* 10.8* 11.4* 10.6*  HCT 36.2 34.4* 36.1 34.2*  MCV 85.8 85.1 84.7 86.6  PLT 159 141* 158 159   Cardiac Enzymes: No results for input(s): CKTOTAL, CKMB, CKMBINDEX, TROPONINI in the last 168 hours. BNP: BNP (last 3 results) Recent Labs    08/21/20 2307 11/08/20 1433 03/05/21 1236  BNP 215.9* 443.2* 584.6*    ProBNP (last 3 results) No results for input(s): PROBNP in the last 8760 hours.  CBG: Recent Labs  Lab 03/09/21 1113 03/09/21 1633 03/09/21 2051 03/10/21 0724 03/10/21 1129  GLUCAP 150* 265* 298* 117* 181*       Signed:  Florencia Reasons MD, PhD, FACP  [facility] 03/10/2021, 11:38 AM

## 2021-03-10 NOTE — Plan of Care (Signed)
  Problem: Education: Goal: Knowledge of General Education information will improve Description: Including pain rating scale, medication(s)/side effects and non-pharmacologic comfort measures Outcome: Adequate for Discharge   Problem: Health Behavior/Discharge Planning: Goal: Ability to manage health-related needs will improve Outcome: Adequate for Discharge   Problem: Clinical Measurements: Goal: Ability to maintain clinical measurements within normal limits will improve 03/10/2021 1416 by Sherron Flemings, RN Outcome: Adequate for Discharge 03/10/2021 1415 by Sherron Flemings, RN Outcome: Progressing Goal: Will remain free from infection Outcome: Adequate for Discharge Goal: Diagnostic test results will improve Outcome: Adequate for Discharge Goal: Respiratory complications will improve Outcome: Adequate for Discharge Goal: Cardiovascular complication will be avoided Outcome: Adequate for Discharge   Problem: Activity: Goal: Risk for activity intolerance will decrease Outcome: Adequate for Discharge   Problem: Nutrition: Goal: Adequate nutrition will be maintained Outcome: Adequate for Discharge   Problem: Coping: Goal: Level of anxiety will decrease Outcome: Adequate for Discharge   Problem: Elimination: Goal: Will not experience complications related to bowel motility Outcome: Adequate for Discharge Goal: Will not experience complications related to urinary retention Outcome: Adequate for Discharge   Problem: Pain Managment: Goal: General experience of comfort will improve Outcome: Adequate for Discharge   Problem: Safety: Goal: Ability to remain free from injury will improve Outcome: Adequate for Discharge   Problem: Skin Integrity: Goal: Risk for impaired skin integrity will decrease Outcome: Adequate for Discharge

## 2021-03-10 NOTE — TOC Transition Note (Signed)
Transition of Care [facility]) - CM/SW Discharge Note   Patient Details  Name: Susan [name] MRN: [id number] Date of Birth: [birth year]  Transition of Care [facility]) CM/SW Contact:  Maebelle Munroe, RN Phone Number: 03/10/2021, 3:17 PM   Clinical Narrative:   [facility] team for discharge planning. Spoke to patient at bedside regarding plan for home health PT and OT post discharge. Pt agrees with plan. Discussed home health agencies available to her. Arranged services with [place]. Pt shares she has all necessary DME at home. Spoke to spouse via phone about discharge plan. Both patient and spouse agree with discharge plan. Nurse Marya Amsler to note agency on ordered services on AVS paperwork for patient.    Final next level of care: Home w Home Health Services Barriers to Discharge: No Barriers Identified   Patient Goals and CMS Choice Patient states their goals for this hospitalization and ongoing recovery are:: Return home with husband. CMS Medicare.gov Compare Post Acute Care list provided to:: Patient Choice offered to / list presented to : Patient  Discharge Placement                       Discharge Plan and Services In-house Referral: Clinical Social Work Discharge Planning Services: CM Consult            DME Arranged:  (Reviewed DME needs with patient. She voices having walker, cane and 3in1 at home. Discussed having therapist look at is to assure it's safe and in good working order.) DME Agency: NA       HH Arranged: PT, OT   Date HH Agency Contacted: 03/10/21 Time [place]: [phone] Representative spoke with at [place]: Freddi [phone]  Social Determinants of Health (SDOH) Interventions Food Insecurity Interventions: Intervention Not Indicated Financial Strain Interventions: Intervention Not Indicated Housing Interventions: Intervention Not Indicated Transportation Interventions: Intervention Not Indicated   Readmission Risk  Interventions Readmission Risk Prevention Plan 05/28/2020 03/12/2020 02/09/2020  Transportation Screening Complete Complete Complete  Medication Review Press photographer) Complete Complete Complete  PCP or Specialist appointment within 3-5 days of discharge Complete Complete Complete  HRI or Home Care Consult Complete Complete Complete  SW Recovery Care/Counseling Consult Complete Complete Complete  Palliative Care Screening Not Applicable Not Applicable Not [place] Not Applicable Not Applicable Not Applicable  Some recent data might be hidden

## 2021-03-10 NOTE — Progress Notes (Signed)
Patient complained of a sharp pain shooting through her chest that last only a second or two.  Patient states that she is having some discomfort in her jaw.  Patient states that she does not feel well.  EKG was obtained, vitals are within normal limits, and Maalox give and MD notified.  Will continue to follow.      Donah Driver, RN

## 2021-03-11 ENCOUNTER — Telehealth: Payer: Self-pay

## 2021-03-11 NOTE — Telephone Encounter (Signed)
Transition Care Management Follow-up Telephone Call Date of discharge and from where: 03/10/21 [facility] Diagnosis: NSTEMI How have you been since you were released from the hospital? Doing ok, not feeling well today due to issues with her blood sugars per daughter Any questions or concerns? No  Items Reviewed: Did the pt receive and understand the discharge instructions provided? Yes  Medications obtained and verified? Yes  Other? No  Any new allergies since your discharge? No  Dietary orders reviewed? Yes Do you have support at home? Yes   Home Care and Equipment/Supplies: Were home health services ordered? yes If so, what is the name of the agency? [facility]  Has the agency set up a time to come to the patient's home? yes Were any new equipment or medical supplies ordered?  No What is the name of the medical supply agency? N/A Were you able to get the supplies/equipment? not applicable Do you have any questions related to the use of the equipment or supplies? No  Functional Questionnaire: (I = Independent and D = Dependent) ADLs: I  Bathing/Dressing- I  Meal Prep- I  Eating- I  Maintaining continence- I  Transferring/Ambulation- I  Managing Meds- I  Follow up appointments reviewed:  PCP Hospital f/u appt confirmed? Yes  Scheduled to see [facility] on 10/21 @ 2P,,M. [facility] f/u appt confirmed? Yes  Scheduled to see [place] on 03/19/21 @ 10 AM. Are transportation arrangements needed? No  If their condition worsens, is the pt aware to call PCP or go to the Emergency Dept.? Yes Was the patient provided with contact information for the PCP's office or ED? Yes Was to pt encouraged to call back with questions or concerns? Yes

## 2021-03-12 ENCOUNTER — Telehealth: Payer: Self-pay | Admitting: *Deleted

## 2021-03-12 ENCOUNTER — Other Ambulatory Visit: Payer: Self-pay | Admitting: Family Medicine

## 2021-03-12 MED ORDER — SITAGLIPTIN PHOSPHATE 25 MG PO TABS: 25.0000 mg | ORAL_TABLET | Freq: Every day | ORAL | 3 refills | Status: DC

## 2021-03-12 NOTE — Telephone Encounter (Signed)
Prescription sent to pharmacy. .   Call placed to patient and patient made aware.  

## 2021-03-12 NOTE — Telephone Encounter (Signed)
Received call from patient.   Reports that [facility] [place] is not covered by insurance.   Patient has appointment on Friday for hospital F/U.

## 2021-03-14 ENCOUNTER — Telehealth: Payer: Self-pay

## 2021-03-14 DIAGNOSIS — I5032 Chronic diastolic (congestive) heart failure: Secondary | ICD-10-CM

## 2021-03-14 DIAGNOSIS — E1142 Type 2 diabetes mellitus with diabetic polyneuropathy: Secondary | ICD-10-CM

## 2021-03-14 DIAGNOSIS — I214 Non-ST elevation (NSTEMI) myocardial infarction: Secondary | ICD-10-CM

## 2021-03-14 DIAGNOSIS — N1832 Chronic kidney disease, stage 3b: Secondary | ICD-10-CM

## 2021-03-14 DIAGNOSIS — Z794 Long term (current) use of insulin: Secondary | ICD-10-CM

## 2021-03-14 DIAGNOSIS — R0600 Dyspnea, unspecified: Secondary | ICD-10-CM

## 2021-03-14 NOTE — Telephone Encounter (Signed)
Call placed to [place] from [place]. States that [facility] Orders for [facility] PT/OT were placed from hospital, but have not been completed at this time. Requested new orders to be placed.   Received call from [facility] with [facility]. [phone]~ telephone. Reports that [place] orders will need to be sent to [facility]. [email or web address]. new orders placed.   Also reports that patient noted to be very depressed. PHQ-9 score 25. States that she does not feel that Prozac is helping.   Patient has appt on 03/15/2021.

## 2021-03-14 NOTE — Telephone Encounter (Signed)
Veronica Consulting civil engineer from [place] called in to ask that an order be put in for this pt to receive Home Health. Care Manager stated that pt discharge instructions has an order for PT and OT, but there was no order for Home Health. If any questions please call,  CB#: [facility] from [place] [phone]

## 2021-03-15 ENCOUNTER — Encounter: Payer: Self-pay | Admitting: Family Medicine

## 2021-03-15 ENCOUNTER — Ambulatory Visit (INDEPENDENT_AMBULATORY_CARE_PROVIDER_SITE_OTHER): Payer: HMO | Admitting: Family Medicine

## 2021-03-15 ENCOUNTER — Other Ambulatory Visit: Payer: Self-pay

## 2021-03-15 VITALS — BP 134/82 | HR 88 | Temp 98.7°F | Resp 14 | Ht 65.0 in | Wt 270.0 lb

## 2021-03-15 DIAGNOSIS — N184 Chronic kidney disease, stage 4 (severe): Secondary | ICD-10-CM

## 2021-03-15 DIAGNOSIS — F339 Major depressive disorder, recurrent, unspecified: Secondary | ICD-10-CM | POA: Diagnosis not present

## 2021-03-15 DIAGNOSIS — E1165 Type 2 diabetes mellitus with hyperglycemia: Secondary | ICD-10-CM | POA: Diagnosis not present

## 2021-03-15 MED ORDER — PREGABALIN 50 MG PO CAPS: 50.0000 mg | ORAL_CAPSULE | Freq: Two times a day (BID) | ORAL | 3 refills | Status: DC

## 2021-03-15 NOTE — Addendum Note (Signed)
Addended by: [name] T on: 03/15/2021 03:19 PM   Modules accepted: Orders

## 2021-03-15 NOTE — Progress Notes (Signed)
Subjective:    Patient ID: Susan [name], female    DOB: 1950/10/24, 70 y.o.   MRN: [id number]  HPI Admit date: 03/05/2021 Discharge date: 03/10/2021   Time spent: 8mins, more than 50% time spent on coordination of care.    Recommendations for Outpatient Follow-up:  F/u with PCP within a week  for hospital discharge follow up, repeat cbc/bmp at follow up F/u with cardiology F/u with nephrology Home health/DME order placed      Discharge Diagnoses:      Active Hospital Problems    Diagnosis Date Noted   NSTEMI (non-ST elevated myocardial infarction) ([place]) 03/05/2021     Resolved Hospital Problems  No resolved problems to display.      Discharge Condition: stable   Diet recommendation: renal diet Clementeen Graham modified        Filed Weights    03/08/21 1209 03/09/21 0552 03/10/21 0656  Weight: 119 kg 124 kg 122.1 kg      History of present illness: ( per admitting MD Dr Roosevelt Locks) Chief Complaint: Chest pain, SOB   HPI: Susan [name] is a 70 y.o. female with medical history significant of CAD with stenting in 4166, chronic diastolic CHF, CKD stage IV, HTN, HLD, PVD, IDDM on insulin pump, presented with worsening of chest pain and shortness of breath.   Patient started to have dry cough and shortness of breath since Friday.  Denies any fever chills.  Then Saturday, she developed pressure-like chest pain, episodic, centrally located, along with worsening of shortness of breath. She took nitroglycerin 2 times Saturday and 3 times on Sunday, despite, she continued to experience chest pains yesterday and worsening of shortness of breath and leg swelling.   She also states that recently fingerstick> 500, her endocrinologist has increased her insulin pump setting several times recently.   ED Course: Patient was found to have elevated blood pressure, O2 saturation borderline hypoxic, x-ray showed bilateral pulmonary congestion.  Troponins 247> 236.  EKG no new ST changes.   Heparin drip  started in the ED as well as 1 dose of IV Lasix.     Hospital Course:  Active Problems:   NSTEMI (non-ST elevated myocardial infarction) ([place])     Non-STEMI known CAD w/ h/o remote MI in 2000 s/p BMS to RCA and again in 2007 w/ BMS to RCA  Initially treated with heparin drip and nitro drip Cardiology consulted, no plan for cardiac cath due to CKDIV, recommend medical management Continue plavix and aspirin,  beta blocker,  high-intensity statin Chest pain resolved, She is cleared to discharge home by cardiology   Acute on chronic diastolic CHF, new reduced EF 30-35% , suspect from progressive CAD Initially received iv lasix 120mg  bid, diuresis held on 10/15 due to rising cr, cr improved, start back on torsemide Continue carvedilol 12.5 mg twice a day  - GDMT limted with CKD stage IV. No dig, sglt2i, spiro, ARNi with creatinine > 3.  Less edema, less sob, reports feeling better, she desires to go home, she is cleared to discharge home by cardiology     Hypokalemia Replaced, k wnl at discharge   mag 2.6   Insulin-dependent diabetes , Type 2 diabetes Uncontrolled, with hyperglycemia A1c 12.2 Seen by endocrinology Dr [name] in 01/2021 insulin pump was adjusted twice in September She and her husband is not sure about pump setting They agree stop insulin pump  and use insulin long acting / plus ssi Seen by diabetes coordinator here Return on insulin pump at  discharge   CKD 4 Bun/cr peaked at 113/3.59, trending down at discharge - she is advised to f/u with nephrology  -renal dosing meds      History of recurrent UTI On prophylactic Keflex chronically Report dysuria, urine appears cloudy on admission Ua culture with drug resistant enterobacter cloacae ( resistent to rocephin)  she received two doses of imipenem in the hospital , case discussed with ID pharmacist who recommend fosphamycin 3g q3d for three doses Prophylactic keflex discontinued as bacteria is tested resistant to  keflex   Constipation Continue MiraLAX, add Senokot Resolved     Patient is here today follow-up.  She denies any chest pain.  She denies any shortness of breath "I do something".  She does have swelling in both legs today however this is at her baseline.  She is taking 80 mg diuretic twice a day which is her baseline.  She denies any cough.  She denies any problem with.  Unfortunately her sugars continue to be highly variable.  She has no reported blood sugars Trulicity.  She is back on her insulin pump.  She states that she is checking her sugars 3 times a day and then doing a sliding scale but was placed on the numbers however the vast majority of her sugars are over 300 which makes me believe that her basal rate needs to be adjusted.  She also states that she is having to give herself 15 units of Novolin in the morning because her fasting blood sugars are under 200.  She does not have any follow-up scheduled with her endocrinologist.  She reports severe depression.  She states that in the past she has been on Lexapro, Zoloft, and she is now on Prozac.  She states that the medicine is no longer helping.  She reports anhedonia depression trouble sleeping lack of energy and poor focus.  She denies any suicidal nation.  Past Medical History:  Diagnosis Date   Acute MI ([place]) 1999; 2007   Anemia    hx   Anginal pain ([place])    Anxiety    ARF (acute renal failure) ([place]) 06/2017   [facility]   Arthritis    "generalized" (03/15/2014)   CAD (coronary artery disease)    MI in 2000 - MI  2007 - treated bare metal stent (no nuclear since then as 9/11)   Carotid artery disease (HCC)    CHF (congestive heart failure) ([place])    Chronic diastolic heart failure ([place])    a) ECHO (08/2013) EF 55-60% and RV function nl b) RHC (08/2013) RA 4, RV 30/5/7, PA 25/10 (16), PCWP 7, Fick CO/CI 6.3/2.7, PVR 1.5 WU, PA 61 and 66%   Daily headache    "~ every other day; since I fell in June" (03/15/2014)    Depression    Dyslipidemia    Dyspnea    Exertional shortness of breath    HTN (hypertension)    Hypothyroidism    Neuropathy    Obesity    Osteoarthritis    Peripheral neuropathy    PONV (postoperative nausea and vomiting)    RBBB (right bundle branch block)    Old   Stroke ([place])    mini strokes   Syncope    likely due to low blood sugar   Tachycardia    Sinus tachycardia   Type II diabetes mellitus (HCC)    Type II   Urinary incontinence    Venous insufficiency    Past Surgical History:  Procedure Laterality Date   ABDOMINAL HYSTERECTOMY  1980's   AMPUTATION Right 02/24/2018   Procedure: RIGHT FOOT GREAT TOE AND 2ND TOE AMPUTATION;  Surgeon: Newt Minion, MD;  Location: [place];  Service: Orthopedics;  Laterality: Right;   AMPUTATION Right 04/30/2018   Procedure: RIGHT TRANSMETATARSAL AMPUTATION;  Surgeon: Newt Minion, MD;  Location: [place];  Service: Orthopedics;  Laterality: Right;   BIOPSY  05/27/2020   Procedure: BIOPSY;  Surgeon: [name], DO;  [facility];  Service: Endoscopy;;   CATARACT EXTRACTION, BILATERAL Bilateral ?2013   COLONOSCOPY W/ POLYPECTOMY     COLONOSCOPY WITH PROPOFOL N/A 03/13/2019   Procedure: COLONOSCOPY WITH PROPOFOL;  Surgeon: Jerene Bears, MD;  Location: [place];  Service: Gastroenterology;  Laterality: N/A;   [place]; 2007   "1 + 1"   ESOPHAGOGASTRODUODENOSCOPY (EGD) WITH PROPOFOL N/A 03/13/2019   Procedure: ESOPHAGOGASTRODUODENOSCOPY (EGD) WITH PROPOFOL;  Surgeon: Jerene Bears, MD;  Location: [place];  Service: Gastroenterology;  Laterality: N/A;   ESOPHAGOGASTRODUODENOSCOPY (EGD) WITH PROPOFOL N/A 05/27/2020   Procedure: ESOPHAGOGASTRODUODENOSCOPY (EGD) WITH PROPOFOL;  Surgeon: [name], DO;  [facility];  Service: Endoscopy;  Laterality: N/A;   EYE SURGERY Bilateral    [place]   HEMOSTASIS CLIP PLACEMENT  03/13/2019   Procedure: HEMOSTASIS CLIP  PLACEMENT;  Surgeon: Jerene Bears, MD;  Location: [facility] ENDOSCOPY;  Service: Gastroenterology;;   KNEE ARTHROSCOPY Left 10/25/2006   POLYPECTOMY  03/13/2019   Procedure: POLYPECTOMY;  Surgeon: Jerene Bears, MD;  Location: [place];  Service: Gastroenterology;;   RIGHT HEART CATH N/A 07/24/2017   Procedure: RIGHT HEART CATH;  Surgeon: Jolaine Artist, MD;  Location: [place] CV LAB;  Service: Cardiovascular;  Laterality: N/A;   RIGHT HEART CATHETERIZATION N/A 09/22/2013   Procedure: RIGHT HEART CATH;  Surgeon: Jolaine Artist, MD;  [facility];  Service: Cardiovascular;  Laterality: N/A;   SHOULDER ARTHROSCOPY WITH OPEN ROTATOR CUFF REPAIR Right 03/14/2014   Procedure: RIGHT SHOULDER ARTHROSCOPY WITH BICEPS RELEASE, OPEN SUBSCAPULA REPAIR, OPEN SUPRASPINATUS REPAIR.;  Surgeon: Meredith Pel, MD;  Location: [place];  Service: Orthopedics;  Laterality: Right;   TOE AMPUTATION Right 02/24/2018   GREAT TOE AND 2ND TOE AMPUTATION   TUBAL LIGATION  1970's   Current Outpatient Medications on File Prior to Visit  Medication Sig Dispense Refill   acetaminophen (TYLENOL) 500 MG tablet Take 1,000 mg by mouth every 6 (six) hours as needed for moderate pain or headache.     albuterol (VENTOLIN HFA) 108 (90 Base) MCG/ACT inhaler TAKE 2 PUFFS BY MOUTH EVERY 6 HOURS AS NEEDED FOR WHEEZE OR SHORTNESS OF BREATH (Patient taking differently: Inhale 2 puffs into the lungs every 6 (six) hours as needed for wheezing or shortness of breath.) 8.5 g 1   allopurinol (ZYLOPRIM) 100 MG tablet Take 0.5 tablets (50 mg total) by mouth daily. 30 tablet 0   Ascorbic Acid (VITAMIN C) 1000 MG tablet Take 1,000 mg by mouth daily.     aspirin EC 81 MG tablet Take 1 tablet (81 mg total) by mouth daily with breakfast. 30 tablet 11   atorvastatin (LIPITOR) 80 MG tablet Take 1 tablet (80 mg total) by mouth daily. 30 tablet 0   carvedilol (COREG) 12.5 MG tablet TAKE 1 TABLET (12.5 MG TOTAL) BY MOUTH 2 (TWO) TIMES  DAILY WITH A MEAL. 180 tablet 1   cholecalciferol (VITAMIN D) 1000 units tablet Take  1,000 Units by mouth daily with supper.      clopidogrel (PLAVIX) 75 MG tablet TAKE 1 TABLET BY MOUTH DAILY WITH BREAKFAST. (Patient taking differently: Take 75 mg by mouth daily.) 90 tablet 3   Cranberry 250 MG TABS Take 500 mg by mouth daily.     ferrous sulfate 325 (65 FE) MG tablet Take 1 tablet (325 mg total) by mouth daily with breakfast. Reported on 08/21/2015 60 tablet 2   FLUoxetine (PROZAC) 20 MG capsule TAKE 1 CAPSULE BY MOUTH EVERY DAY (Patient taking differently: Take 20 mg by mouth daily.) 90 capsule 2   fosfomycin (MONUROL) 3 g PACK Take 3 g by mouth every 3 (three) days for 2 doses. 2 each 0   guaiFENesin-dextromethorphan (ROBITUSSIN DM) 100-10 MG/5ML syrup Take 10 mLs by mouth every 4 (four) hours as needed for cough. 118 mL 0   hydrALAZINE (APRESOLINE) 10 MG tablet Take 1 tablet (10 mg total) by mouth every 8 (eight) hours. 90 tablet 0   hydrocortisone cream 0.5 % Apply 1 application topically 2 (two) times daily. 30 g 0   Insulin Disposable Pump (OMNIPOD DASH 5 PACK PODS) MISC Inject into the skin See admin instructions. Use continuously with Novolin R - change every 72 hours.     insulin NPH Human (HUMULIN N,NOVOLIN N) 100 UNIT/ML injection Inject 15 Units into the skin See admin instructions. Inject 15 units subcutaneously in the morning if CBG >300;     insulin regular (NOVOLIN R) 100 units/mL injection Inject into the skin See admin instructions. Manually add bolus to continuous dose via OmniPod 3 times daily per sliding scale (CBG 80-150 7 units, 151-200 9 units, 201-250 12 units, 251-300 14 units, 301- 400 17 units)     isosorbide mononitrate (IMDUR) 30 MG 24 hr tablet Take 0.5 tablets (15 mg total) by mouth daily. 30 tablet 0   levothyroxine (SYNTHROID, LEVOTHROID) 50 MCG tablet Take 1 tablet (50 mcg total) by mouth daily before breakfast. 90 tablet 1   magnesium oxide (MAG-OX) 400 MG tablet  Take 1 tablet (400 mg total) by mouth every Monday, Wednesday, and Friday. 120 tablet 2   Multiple Vitamins-Minerals (AIRBORNE GUMMIES PO) Take 2 tablets by mouth every evening.     Multiple Vitamins-Minerals (CENTRUM SILVER 50+WOMEN) TABS Take 1 tablet by mouth every evening.     nitroGLYCERIN (NITROSTAT) 0.4 MG SL tablet PLACE 1 TABLET (0.4 MG TOTAL) UNDER THE TONGUE EVERY 5 (FIVE) MINUTES AS NEEDED FOR CHEST PAIN. 25 tablet 1   ONETOUCH VERIO test strip 3 (three) times daily.     polyethylene glycol (MIRALAX / GLYCOLAX) 17 g packet Take 17 g by mouth daily. (Patient taking differently: Take 17 g by mouth daily as needed for mild constipation.) 14 each 0   sitaGLIPtin (JANUVIA) 25 MG tablet Take 1 tablet (25 mg total) by mouth daily. 90 tablet 3   torsemide (DEMADEX) 20 MG tablet Take 80 mg by mouth 2 (two) times daily.     ursodiol (ACTIGALL) 500 MG tablet Take 500 mg by mouth 3 (three) times daily.     vitamin B-12 (CYANOCOBALAMIN) 1000 MCG tablet Take 1,000 mcg by mouth daily.     zinc gluconate 50 MG tablet Take 50 mg by mouth daily.     No current facility-administered medications on file prior to visit.   Allergies  Allergen Reactions   Drug Ingredient [Cephalexin] Diarrhea    Pt reports heavy diarrhea with use of keflex   Codeine Nausea And  Vomiting   Social History   Socioeconomic History   Marital status: Married    Spouse name: Not on file   Number of children: 3   Years of education: 12th   Highest education level: Not on file  Occupational History    Employer: UNEMPLOYED  Tobacco Use   Smoking status: Former    Packs/day: 3.00    Years: 32.00    Pack years: 96.00    Types: Cigarettes    Quit date: 10/24/1997    Years since quitting: 23.4   Smokeless tobacco: Never  Vaping Use   Vaping Use: Never used  Substance and Sexual Activity   Alcohol use: Not Currently    Comment: "might have 2-3 daiquiris in the summer"   Drug use: No   Sexual activity: Not Currently   Other Topics Concern   Not on file  Social History Narrative   Pt lives at home with her spouse.   Caffeine Use- 3 sodas daily   Social Determinants of Health   Financial Resource Strain: Low Risk    Difficulty of Paying Living Expenses: Not very hard  Food Insecurity: No Food Insecurity   Worried About Charity fundraiser in the Last Year: Never true   Ran Out of Food in the Last Year: Never true  Transportation Needs: No Transportation Needs   Lack of Transportation (Medical): No   Lack of Transportation (Non-Medical): No  Physical Activity: Not on file  Stress: Not on file  Social Connections: Not on file  Intimate Partner Violence: Not on file     Review of Systems     Objective:   Physical Exam Constitutional:      General: She is not in acute distress.    Appearance: Normal appearance. She is obese. She is not ill-appearing or toxic-appearing.  Cardiovascular:     Rate and Rhythm: Normal rate and regular rhythm.     Heart sounds: Normal heart sounds. No murmur heard.   No friction rub. No gallop.  Pulmonary:     Effort: Pulmonary effort is normal. No respiratory distress.     Breath sounds: Normal breath sounds. No stridor. No wheezing, rhonchi or rales.  Abdominal:     General: Bowel sounds are normal.     Palpations: Abdomen is soft.  Musculoskeletal:     Right lower leg: Edema present.     Left lower leg: Edema present.  Neurological:     Mental Status: She is alert.          Assessment & Plan:  Stage 4 chronic kidney disease ([place]) - Plan: CBC with Differential/Platelet, COMPLETE METABOLIC PANEL WITH GFR  Uncontrolled type 2 diabetes mellitus with hyperglycemia (HCC)  Depression, recurrent ([place]) Patient wants to resume the Lyrica that she was taking due to the neuropathic pain in her legs.  She was previously on extremely high dose based on her renal function.  I want to start her back on 50 mg twice a day due to her poor kidney function.  Recheck  renal function and CBC while the patient is here.  Discontinue Prozac and switch to Trintellix 10 mg a day and recheck in 3 weeks.  Blood pressure today is stable.  Patient does not appear fluid overloaded.  However I explained to the patient that the biggest risk factor she has for her heart failure, coronary artery disease, and kidney failure is her poorly controlled diabetes.  Therefore I insisted that the patient call her endocrinologist and schedule  a follow-up to make adjustments to her insulin pump given her uncontrolled sugars.

## 2021-03-16 LAB — CBC WITH DIFFERENTIAL/PLATELET
Absolute Monocytes: 675 cells/uL (ref 200–950)
Basophils Absolute: 36 cells/uL (ref 0–200)
Basophils Relative: 0.4 %
Eosinophils Absolute: 441 cells/uL (ref 15–500)
Eosinophils Relative: 4.9 %
HCT: 38.7 % (ref 35.0–45.0)
Hemoglobin: 12.2 g/dL (ref 11.7–15.5)
Lymphs Abs: 864 cells/uL (ref 850–3900)
MCH: 27 pg (ref 27.0–33.0)
MCHC: 31.5 g/dL — ABNORMAL LOW (ref 32.0–36.0)
MCV: 85.6 fL (ref 80.0–100.0)
MPV: 13.4 fL — ABNORMAL HIGH (ref 7.5–12.5)
Monocytes Relative: 7.5 %
Neutro Abs: 6984 cells/uL (ref 1500–7800)
Neutrophils Relative %: 77.6 %
Platelets: 182 10*3/uL (ref 140–400)
RBC: 4.52 10*6/uL (ref 3.80–5.10)
RDW: 15.9 % — ABNORMAL HIGH (ref 11.0–15.0)
Total Lymphocyte: 9.6 %
WBC: 9 10*3/uL (ref 3.8–10.8)

## 2021-03-16 LAB — COMPLETE METABOLIC PANEL WITH GFR
AG Ratio: 1.4 (calc) (ref 1.0–2.5)
ALT: 19 U/L (ref 6–29)
AST: 23 U/L (ref 10–35)
Albumin: 3.7 g/dL (ref 3.6–5.1)
Alkaline phosphatase (APISO): 133 U/L (ref 37–153)
BUN/Creatinine Ratio: 28 (calc) — ABNORMAL HIGH (ref 6–22)
BUN: 89 mg/dL — ABNORMAL HIGH (ref 7–25)
CO2: 26 mmol/L (ref 20–32)
Calcium: 8.9 mg/dL (ref 8.6–10.4)
Chloride: 102 mmol/L (ref 98–110)
Creat: 3.22 mg/dL — ABNORMAL HIGH (ref 0.60–1.00)
Globulin: 2.6 g/dL (calc) (ref 1.9–3.7)
Glucose, Bld: 89 mg/dL (ref 65–99)
Potassium: 4.1 mmol/L (ref 3.5–5.3)
Sodium: 140 mmol/L (ref 135–146)
Total Bilirubin: 0.4 mg/dL (ref 0.2–1.2)
Total Protein: 6.3 g/dL (ref 6.1–8.1)
eGFR: 15 mL/min/{1.73_m2} — ABNORMAL LOW (ref >=60)

## 2021-03-17 DIAGNOSIS — I5032 Chronic diastolic (congestive) heart failure: Secondary | ICD-10-CM | POA: Diagnosis not present

## 2021-03-18 ENCOUNTER — Telehealth: Payer: Self-pay | Admitting: *Deleted

## 2021-03-18 ENCOUNTER — Telehealth: Payer: HMO

## 2021-03-18 NOTE — Chronic Care Management (AMB) (Signed)
  Care Management   Note  03/18/2021 Name: Karmella Bouvier MRN: [id number] DOB: May 21, 1951  Waleska Buttery is a 70 y.o. year old female who is a primary care patient of [name], MD and is actively engaged with the care management team. I reached out to [name] by phone today to assist with re-scheduling a follow up visit with the RN Case Manager  Follow up plan: Unsuccessful telephone outreach attempt made. A HIPAA compliant phone message was left for the patient providing contact information and requesting a return call.  The care management team will reach out to the patient again over the next 7 days.  If patient returns call to provider office, please advise to call [place] at [phone].  [facility]  Direct Dial: [phone]

## 2021-03-19 ENCOUNTER — Encounter (HOSPITAL_COMMUNITY): Payer: HMO

## 2021-03-20 ENCOUNTER — Telehealth: Payer: Self-pay | Admitting: *Deleted

## 2021-03-20 NOTE — Telephone Encounter (Signed)
Received call from [place] with [facility] [phone]~ telephone.   Reports that [facility] services will be delayed due to staffing issues.   PCP to be made aware.

## 2021-03-22 DIAGNOSIS — E1165 Type 2 diabetes mellitus with hyperglycemia: Secondary | ICD-10-CM | POA: Diagnosis not present

## 2021-03-25 DIAGNOSIS — Z794 Long term (current) use of insulin: Secondary | ICD-10-CM

## 2021-03-25 DIAGNOSIS — E1142 Type 2 diabetes mellitus with diabetic polyneuropathy: Secondary | ICD-10-CM | POA: Diagnosis not present

## 2021-03-25 DIAGNOSIS — I5032 Chronic diastolic (congestive) heart failure: Secondary | ICD-10-CM

## 2021-03-25 NOTE — Chronic Care Management (AMB) (Signed)
  Care Management   Note  03/25/2021 Name: Yuri Flener MRN: [id number] DOB: [birth year]  Sheetal Lyall is a 70 y.o. year old female who is a primary care patient of Dennard Schaumann, [name], MD and is actively engaged with the care management team. I reached out to [name] by phone today to assist with re-scheduling a follow up visit with the RN Case Manager  Follow up plan: Telephone appointment with care management team member scheduled for:04/08/21  [facility] [place] [facility]  Direct Dial: [phone]

## 2021-03-27 ENCOUNTER — Telehealth: Payer: Self-pay | Admitting: *Deleted

## 2021-03-27 NOTE — Telephone Encounter (Signed)
Received call from [place], [facility] PT with [facility].   Reports that patient has declined physical therapy at this time.   Call placed to patient. Patient reports that she does not feel she requires PT at this time.

## 2021-03-30 ENCOUNTER — Other Ambulatory Visit: Payer: Self-pay | Admitting: Family Medicine

## 2021-04-01 ENCOUNTER — Other Ambulatory Visit: Payer: Self-pay

## 2021-04-02 ENCOUNTER — Encounter (HOSPITAL_COMMUNITY): Payer: Self-pay

## 2021-04-02 ENCOUNTER — Other Ambulatory Visit (HOSPITAL_COMMUNITY): Payer: Self-pay

## 2021-04-02 ENCOUNTER — Ambulatory Visit (HOSPITAL_COMMUNITY)
Admission: RE | Admit: 2021-04-02 | Discharge: 2021-04-02 | Disposition: A | Discharge status: Home / Self Care | Payer: HMO | Source: Ambulatory Visit | Attending: Family Medicine | Admitting: Family Medicine

## 2021-04-02 ENCOUNTER — Other Ambulatory Visit: Payer: Self-pay

## 2021-04-02 VITALS — BP 118/70 | HR 82 | Wt 275.0 lb

## 2021-04-02 DIAGNOSIS — Z7982 Long term (current) use of aspirin: Secondary | ICD-10-CM | POA: Insufficient documentation

## 2021-04-02 DIAGNOSIS — Z8673 Personal history of transient ischemic attack (TIA), and cerebral infarction without residual deficits: Secondary | ICD-10-CM | POA: Insufficient documentation

## 2021-04-02 DIAGNOSIS — N184 Chronic kidney disease, stage 4 (severe): Secondary | ICD-10-CM | POA: Insufficient documentation

## 2021-04-02 DIAGNOSIS — I10 Essential (primary) hypertension: Secondary | ICD-10-CM | POA: Diagnosis not present

## 2021-04-02 DIAGNOSIS — I5032 Chronic diastolic (congestive) heart failure: Secondary | ICD-10-CM

## 2021-04-02 DIAGNOSIS — Z955 Presence of coronary angioplasty implant and graft: Secondary | ICD-10-CM | POA: Insufficient documentation

## 2021-04-02 DIAGNOSIS — I252 Old myocardial infarction: Secondary | ICD-10-CM | POA: Insufficient documentation

## 2021-04-02 DIAGNOSIS — Z7902 Long term (current) use of antithrombotics/antiplatelets: Secondary | ICD-10-CM | POA: Diagnosis not present

## 2021-04-02 DIAGNOSIS — I2510 Atherosclerotic heart disease of native coronary artery without angina pectoris: Secondary | ICD-10-CM

## 2021-04-02 DIAGNOSIS — I4510 Unspecified right bundle-branch block: Secondary | ICD-10-CM | POA: Diagnosis not present

## 2021-04-02 DIAGNOSIS — E1165 Type 2 diabetes mellitus with hyperglycemia: Secondary | ICD-10-CM

## 2021-04-02 DIAGNOSIS — Z794 Long term (current) use of insulin: Secondary | ICD-10-CM | POA: Insufficient documentation

## 2021-04-02 DIAGNOSIS — E039 Hypothyroidism, unspecified: Secondary | ICD-10-CM | POA: Diagnosis not present

## 2021-04-02 DIAGNOSIS — E1122 Type 2 diabetes mellitus with diabetic chronic kidney disease: Secondary | ICD-10-CM | POA: Diagnosis not present

## 2021-04-02 DIAGNOSIS — I130 Hypertensive heart and chronic kidney disease with heart failure and stage 1 through stage 4 chronic kidney disease, or unspecified chronic kidney disease: Secondary | ICD-10-CM | POA: Diagnosis not present

## 2021-04-02 LAB — BASIC METABOLIC PANEL
Anion gap: 11 (ref 5–15)
BUN: 76 mg/dL — ABNORMAL HIGH (ref 8–23)
CO2: 29 mmol/L (ref 22–32)
Calcium: 8.9 mg/dL (ref 8.9–10.3)
Chloride: 100 mmol/L (ref 98–111)
Creatinine, Ser: 2.88 mg/dL — ABNORMAL HIGH (ref 0.44–1.00)
GFR, Estimated: 17 mL/min — ABNORMAL LOW (ref >60)
Glucose, Bld: 217 mg/dL — ABNORMAL HIGH (ref 70–99)
Potassium: 4.3 mmol/L (ref 3.5–5.1)
Sodium: 140 mmol/L (ref 135–145)

## 2021-04-02 MED ORDER — METOLAZONE 2.5 MG PO TABS: 2.5000 mg | ORAL_TABLET | Freq: Every day | ORAL | 3 refills | Status: DC

## 2021-04-02 MED ORDER — POTASSIUM CHLORIDE CRYS ER 20 MEQ PO TBCR: 20.0000 meq | EXTENDED_RELEASE_TABLET | ORAL | 1 refills | Status: DC

## 2021-04-02 MED ORDER — ATORVASTATIN CALCIUM 80 MG PO TABS: 80.0000 mg | ORAL_TABLET | Freq: Every day | ORAL | 5 refills | Status: DC

## 2021-04-02 NOTE — Progress Notes (Signed)
ReDS Vest / Clip - 04/02/21 1400       ReDS Vest / Clip   Station Marker B    Ruler Value 37.5    ReDS Value Range Moderate volume overload    ReDS Actual Value 38

## 2021-04-02 NOTE — Progress Notes (Signed)
Advanced Heart Failure Clinic Note   Primary Physician [name], MD HF Cardiologist: Glori Bickers, MD  Nephrology: Dr Hollie Salk  Reason for Visit:  F/u for Chronic Diastolic Heart Failure  HPI:  Susan [name] is a 70 y.o. female with a history of RBBB, DM, Hypothyroidish, diastolic heart failure, CAD MI 2000/2007 BMS 2007, TIA, obesity, CKD Stage IV and  Rt transmetatarsal amputation.    Admitted June 1st through June 3rd, 2017 with altered mental status, hypothermia, and weakness. Diuretics initially held and later restarted.    Admitted 2/27-07/28/17 for AKI. Torsemide was held and restarted at 60 mg BID. She had RHC 07/24/17 that showed mild PAH with evidence of RV strain likely due to OHS/OSA. She was discharged to SNF.    Admitted 0/8657 with A/C diastolic HF. Diuresed with IV lasix.    Admitted 10/24-10/27/19 with syncope. She was orthostatic. Had AKI with creatinine up to 3.33 and required IVF. Renal US showed no hydronephrosis. Losartan and Imdur were DC'd. Creatinine 1.76 on day of DC.   Was admitted 1/21 for confusion and abdominal pain. Found to have Klebsiella pneumoniae UTI treated w/ abx. Also with elevated LFTs. CT of abdomen showed cholelithiasis. MRCP showed cholelithiasis without evidence of cholecystitis or choledocholithiasis. All viral serologies negative. Her Crestor was discontinued. She had outpatient GI f/u and continued work-up including a liver biopsy 08/11/19. No definite pathologic fibrosis noted in pathology report.   Admitted to [facility] 05/17/20 with N/V/D in the setting of possible gastroparesis.    Admitted to [facility] 05/24/20 with and A/C diastolic heart failure. Diuresed with IV lasix and transitioned to torsemide 80 mg twice a day.   She returned for HF follow up 1/22 with volume up. She was instructed to increase torsemide to 100 mg bid x 2 days, then back down to 80 mg bid.  Had return f/u again on 2/11 and was still taking 100 of torsemide bid and  had not reduced back down to 80 bid as previously directed. Labs showed AKI, w/ SCr rising from baseline of 1.9>>2.4. Was advised to reduce torsemide to 80 mg once daily x 3 days, then return to 80 mg bid.   Had home sleep study 07/25/20 and this was negative for OSA.   08/14/20, she was admitted to [facility] with AKI and progressive volume overloaded in setting of recurrent UTI and probable ATN. She was placed on IV abx and IV lasix w/ improved renal function and volume status. SCr was down to 1.78 day of d/c. PT recommended SNF however pt declined. She was discharged home on 3/27 but readmitted 2 days later for urinary retention and recurrent AKI. UA showed persistent UTI. She was placed back on IV abx.  CT without hydronephrosis. SCr improved w/ tx of UTI. She was started back on PO torsemide, but discharged home on 80 mg once daily (previous dose was 80 bid). She was discharged home on 4/4.    Post hospital f/u on 09/04/20 and she was mildly fluid overloaded. Increased torsemide to 80 mg qhs/ 40 mg qpm.   Seen in clinic 6/22, volume up, weight up 12 lbs, eating more salty foods. Torsemide increased. Stable NYHA III symptoms.  Admitted 10/22 with NSTEMI. Managed medically due to [place]. Echo this admit with decline in EF to 30-35% and new inferior WMA. Suspect progression in RCA disease. AHF consulted for management. GDMT limited by renal disease. Hospitalization c/b AKI and UTI. Discharge weight 268 lbs.  Today she returns for  post hospital HF follow up with her husband. She is more SOB last couple of weeks with minimal exertion, abdomen feels more tight and she is more fatigued. Feels torsemide is not working as well as it has in the past. Denies CP, dizziness, abnormal bleeding or palpitations. Chronically sleeps in recliner. Appetite ok. No fever or chills. Taking all medications. Blood sugars better controlled now. Eats out about 3x/week ([place], [employer], [place]).  Cardiac Studies: Echo (10/22): EF  30-35% Echo (9/21): EF 55-60%, RV ok Echo (10/19): EF 55-60%, Grade I DD  RHC (07/24/17): showed mild PAH with evidence of RV strain likely due to OHS/OSA.  Findings: RA = 17 RV = 48/15 PA = 49/11 (30) PCW = 18 Fick cardiac output/index = 7.0/3.0 PVR = 0.5 WU Ao sat = 97% PA sat = 62%, 64% Assessment: 1. Normal left-sided pressures 2. Mild PAH with evidence of RV strain likely due to OHS/OSA 3. Normal cardiac output  Lexiscan w/ stress echo (2018): EF 68%, low risk  LHC (2007): BMS distal RCA Cardiolite (03/2002): EF of 51% but no ischemia LHC (2000): BMS RCA   ROS: All systems reviewed and negative except as per HPI.   Current Meds  Medication Sig   acetaminophen (TYLENOL) 500 MG tablet Take 1,000 mg by mouth every 6 (six) hours as needed for moderate pain or headache.   albuterol (VENTOLIN HFA) 108 (90 Base) MCG/ACT inhaler TAKE 2 PUFFS BY MOUTH EVERY 6 HOURS AS NEEDED FOR WHEEZE OR SHORTNESS OF BREATH   allopurinol (ZYLOPRIM) 100 MG tablet Take 0.5 tablets (50 mg total) by mouth daily.   Ascorbic Acid (VITAMIN C) 1000 MG tablet Take 1,000 mg by mouth daily.   aspirin EC 81 MG tablet Take 1 tablet (81 mg total) by mouth daily with breakfast.   atorvastatin (LIPITOR) 80 MG tablet Take 1 tablet (80 mg total) by mouth daily.   carvedilol (COREG) 12.5 MG tablet TAKE 1 TABLET (12.5 MG TOTAL) BY MOUTH 2 (TWO) TIMES DAILY WITH A MEAL.   cholecalciferol (VITAMIN D) 1000 units tablet Take 1,000 Units by mouth daily with supper.    clopidogrel (PLAVIX) 75 MG tablet TAKE 1 TABLET BY MOUTH DAILY WITH BREAKFAST.   Cranberry 250 MG TABS Take 500 mg by mouth daily.   ferrous sulfate 325 (65 FE) MG tablet Take 1 tablet (325 mg total) by mouth daily with breakfast. Reported on 08/21/2015   FLUoxetine (PROZAC) 20 MG capsule TAKE 1 CAPSULE BY MOUTH EVERY DAY   fosfomycin (MONUROL) 3 g PACK See admin instructions.   guaiFENesin-dextromethorphan (ROBITUSSIN DM) 100-10 MG/5ML syrup Take 10 mLs by  mouth every 4 (four) hours as needed for cough.   hydrALAZINE (APRESOLINE) 10 MG tablet 1 tablet with food   hydrocortisone cream 0.5 % Apply 1 application topically 2 (two) times daily.   Insulin Disposable Pump (OMNIPOD DASH 5 PACK PODS) MISC Inject into the skin See admin instructions. Use continuously with Novolin R - change every 72 hours.   insulin NPH Human (HUMULIN N,NOVOLIN N) 100 UNIT/ML injection Inject 15 Units into the skin See admin instructions. Inject 15 units subcutaneously in the morning if CBG >300;   insulin regular (NOVOLIN R) 100 units/mL injection Inject into the skin See admin instructions. Manually add bolus to continuous dose via OmniPod 3 times daily per sliding scale (CBG 80-150 7 units, 151-200 9 units, 201-250 12 units, 251-300 14 units, 301- 400 17 units)   isosorbide mononitrate (IMDUR) 30 MG 24 hr tablet Take  0.5 tablets (15 mg total) by mouth daily.   levothyroxine (SYNTHROID, LEVOTHROID) 50 MCG tablet Take 1 tablet (50 mcg total) by mouth daily before breakfast.   linagliptin (TRADJENTA) 5 MG TABS tablet 1 tablet   magnesium oxide (MAG-OX) 400 (240 Mg) MG tablet Take 1 tablet by mouth daily.   Multiple Vitamins-Minerals (AIRBORNE GUMMIES PO) Take 2 tablets by mouth every evening.   Multiple Vitamins-Minerals (AIRBORNE GUMMIES PO) Take 2 tablets by mouth daily.   Multiple Vitamins-Minerals (CENTRUM SILVER 50+WOMEN) TABS Take 1 tablet by mouth every evening.   nitroGLYCERIN (NITROSTAT) 0.4 MG SL tablet PLACE 1 TABLET (0.4 MG TOTAL) UNDER THE TONGUE EVERY 5 (FIVE) MINUTES AS NEEDED FOR CHEST PAIN.   ONETOUCH VERIO test strip 3 (three) times daily.   polyethylene glycol (MIRALAX / GLYCOLAX) 17 g packet 1 packet mixed with 8 ounces of fluid   pregabalin (LYRICA) 150 MG capsule Take 150 mg by mouth 2 (two) times daily.   sitaGLIPtin (JANUVIA) 25 MG tablet Take 1 tablet (25 mg total) by mouth daily.   torsemide (DEMADEX) 20 MG tablet Take 80 mg by mouth 2 (two) times  daily.   ursodiol (ACTIGALL) 500 MG tablet Take 500 mg by mouth 3 (three) times daily.   vitamin B-12 (CYANOCOBALAMIN) 1000 MCG tablet Take 1,000 mcg by mouth daily.   vortioxetine HBr (TRINTELLIX) 10 MG TABS tablet daily.   zinc gluconate 50 MG tablet Take 50 mg by mouth daily.   Allergies  Allergen Reactions   Drug Ingredient [Cephalexin] Diarrhea    Pt reports heavy diarrhea with use of keflex   Codeine Nausea And Vomiting   Past Medical History:  Diagnosis Date   Acute MI ([place]) 1999; 2007   Anemia    hx   Anginal pain (HCC)    Anxiety    ARF (acute renal failure) ([place]) 06/2017   [facility] Kidney Asso   Arthritis    "generalized" (03/15/2014)   CAD (coronary artery disease)    MI in 2000 - MI  2007 - treated bare metal stent (no nuclear since then as 9/11)   Carotid artery disease (HCC)    CHF (congestive heart failure) ([place])    Chronic diastolic heart failure ([place])    a) ECHO (08/2013) EF 55-60% and RV function nl b) RHC (08/2013) RA 4, RV 30/5/7, PA 25/10 (16), PCWP 7, Fick CO/CI 6.3/2.7, PVR 1.5 WU, PA 61 and 66%   Daily headache    "~ every other day; since I fell in June" (03/15/2014)   Depression    Dyslipidemia    Dyspnea    Exertional shortness of breath    HTN (hypertension)    Hypothyroidism    Neuropathy    Obesity    Osteoarthritis    Peripheral neuropathy    PONV (postoperative nausea and vomiting)    RBBB (right bundle branch block)    Old   Stroke ([place])    mini strokes   Syncope    likely due to low blood sugar   Tachycardia    Sinus tachycardia   Type II diabetes mellitus (HCC)    Type II   Urinary incontinence    Venous insufficiency    Family History  Problem Relation Age of Onset   Heart attack Mother 44   Past Surgical History:  Procedure Laterality Date   ABDOMINAL HYSTERECTOMY  1980's   AMPUTATION Right 02/24/2018   Procedure: RIGHT FOOT GREAT TOE AND 2ND TOE AMPUTATION;  Surgeon: Newt Minion,  MD;  Location: [place];  Service:  Orthopedics;  Laterality: Right;   AMPUTATION Right 04/30/2018   Procedure: RIGHT TRANSMETATARSAL AMPUTATION;  Surgeon: Newt Minion, MD;  Location: [place];  Service: Orthopedics;  Laterality: Right;   BIOPSY  05/27/2020   Procedure: BIOPSY;  Surgeon: [name], DO;  [facility];  Service: Endoscopy;;   CATARACT EXTRACTION, BILATERAL Bilateral ?2013   COLONOSCOPY W/ POLYPECTOMY     COLONOSCOPY WITH PROPOFOL N/A 03/13/2019   Procedure: COLONOSCOPY WITH PROPOFOL;  Surgeon: Jerene Bears, MD;  Location: [place];  Service: Gastroenterology;  Laterality: N/A;   [place]; 2007   "1 + 1"   ESOPHAGOGASTRODUODENOSCOPY (EGD) WITH PROPOFOL N/A 03/13/2019   Procedure: ESOPHAGOGASTRODUODENOSCOPY (EGD) WITH PROPOFOL;  Surgeon: Jerene Bears, MD;  Location: [place];  Service: Gastroenterology;  Laterality: N/A;   ESOPHAGOGASTRODUODENOSCOPY (EGD) WITH PROPOFOL N/A 05/27/2020   Procedure: ESOPHAGOGASTRODUODENOSCOPY (EGD) WITH PROPOFOL;  Surgeon: [name], DO;  [facility];  Service: Endoscopy;  Laterality: N/A;   EYE SURGERY Bilateral    lazer   HEMOSTASIS CLIP PLACEMENT  03/13/2019   Procedure: HEMOSTASIS CLIP PLACEMENT;  Surgeon: Jerene Bears, MD;  Location: [facility];  Service: Gastroenterology;;   KNEE ARTHROSCOPY Left 10/25/2006   POLYPECTOMY  03/13/2019   Procedure: POLYPECTOMY;  Surgeon: Jerene Bears, MD;  Location: [place];  Service: Gastroenterology;;   RIGHT HEART CATH N/A 07/24/2017   Procedure: RIGHT HEART CATH;  Surgeon: Jolaine Artist, MD;  Location: [facility];  Service: Cardiovascular;  Laterality: N/A;   RIGHT HEART CATHETERIZATION N/A 09/22/2013   Procedure: RIGHT HEART CATH;  Surgeon: Jolaine Artist, MD;  Location: [name];  Service: Cardiovascular;  Laterality: N/A;   SHOULDER ARTHROSCOPY WITH OPEN ROTATOR CUFF REPAIR Right 03/14/2014   Procedure: RIGHT SHOULDER ARTHROSCOPY  WITH BICEPS RELEASE, OPEN SUBSCAPULA REPAIR, OPEN SUPRASPINATUS REPAIR.;  Surgeon: Meredith Pel, MD;  Location: [place];  Service: Orthopedics;  Laterality: Right;   TOE AMPUTATION Right 02/24/2018   GREAT TOE AND 2ND TOE AMPUTATION   TUBAL LIGATION  1970's   Social History   Socioeconomic History   Marital status: Married    Spouse name: Not on file   Number of children: 3   Years of education: 12th   Highest education level: Not on file  Occupational History    Employer: UNEMPLOYED  Tobacco Use   Smoking status: Former    Packs/day: 3.00    Years: 32.00    Pack years: 96.00    Types: Cigarettes    Quit date: 10/24/1997    Years since quitting: 23.4   Smokeless tobacco: Never  Vaping Use   Vaping Use: Never used  Substance and Sexual Activity   Alcohol use: Not Currently    Comment: "might have 2-3 daiquiris in the summer"   Drug use: No   Sexual activity: Not Currently  Other Topics Concern   Not on file  Social History Narrative   Pt lives at home with her spouse.   Caffeine Use- 3 sodas daily   Social Determinants of Health   Financial Resource Strain: Low Risk    Difficulty of Paying Living Expenses: Not very hard  Food Insecurity: No Food Insecurity   Worried About Running Out of Food in the Last Year: Never true   Ran Out of Food in the Last Year: Never true  Transportation Needs: No Transportation Needs  Lack of Transportation (Medical): No   Lack of Transportation (Non-Medical): No  Physical Activity: Not on file  Stress: Not on file  Social Connections: Not on file  Intimate Partner Violence: Not on file   Lipid Panel     Component Value Date/Time   CHOL 172 03/06/2021 0500   TRIG 150 (H) 03/06/2021 0500   HDL 51 03/06/2021 0500   CHOLHDL 3.4 03/06/2021 0500   VLDL 30 03/06/2021 0500   LDLCALC 91 03/06/2021 0500   LDLCALC 65 01/17/2019 1608   Current Outpatient Medications on File Prior to Encounter  Medication Sig Dispense Refill    acetaminophen (TYLENOL) 500 MG tablet Take 1,000 mg by mouth every 6 (six) hours as needed for moderate pain or headache.     albuterol (VENTOLIN HFA) 108 (90 Base) MCG/ACT inhaler TAKE 2 PUFFS BY MOUTH EVERY 6 HOURS AS NEEDED FOR WHEEZE OR SHORTNESS OF BREATH 8.5 g 1   allopurinol (ZYLOPRIM) 100 MG tablet Take 0.5 tablets (50 mg total) by mouth daily. 30 tablet 0   Ascorbic Acid (VITAMIN C) 1000 MG tablet Take 1,000 mg by mouth daily.     aspirin EC 81 MG tablet Take 1 tablet (81 mg total) by mouth daily with breakfast. 30 tablet 11   atorvastatin (LIPITOR) 80 MG tablet Take 1 tablet (80 mg total) by mouth daily. 30 tablet 0   carvedilol (COREG) 12.5 MG tablet TAKE 1 TABLET (12.5 MG TOTAL) BY MOUTH 2 (TWO) TIMES DAILY WITH A MEAL. 180 tablet 1   cholecalciferol (VITAMIN D) 1000 units tablet Take 1,000 Units by mouth daily with supper.      clopidogrel (PLAVIX) 75 MG tablet TAKE 1 TABLET BY MOUTH DAILY WITH BREAKFAST. 90 tablet 3   Cranberry 250 MG TABS Take 500 mg by mouth daily.     ferrous sulfate 325 (65 FE) MG tablet Take 1 tablet (325 mg total) by mouth daily with breakfast. Reported on 08/21/2015 60 tablet 2   FLUoxetine (PROZAC) 20 MG capsule TAKE 1 CAPSULE BY MOUTH EVERY DAY 90 capsule 2   fosfomycin (MONUROL) 3 g PACK See admin instructions.     guaiFENesin-dextromethorphan (ROBITUSSIN DM) 100-10 MG/5ML syrup Take 10 mLs by mouth every 4 (four) hours as needed for cough. 118 mL 0   hydrALAZINE (APRESOLINE) 10 MG tablet 1 tablet with food     hydrocortisone cream 0.5 % Apply 1 application topically 2 (two) times daily. 30 g 0   Insulin Disposable Pump (OMNIPOD DASH 5 PACK PODS) MISC Inject into the skin See admin instructions. Use continuously with Novolin R - change every 72 hours.     insulin NPH Human (HUMULIN N,NOVOLIN N) 100 UNIT/ML injection Inject 15 Units into the skin See admin instructions. Inject 15 units subcutaneously in the morning if CBG >300;     insulin regular (NOVOLIN R)  100 units/mL injection Inject into the skin See admin instructions. Manually add bolus to continuous dose via OmniPod 3 times daily per sliding scale (CBG 80-150 7 units, 151-200 9 units, 201-250 12 units, 251-300 14 units, 301- 400 17 units)     isosorbide mononitrate (IMDUR) 30 MG 24 hr tablet Take 0.5 tablets (15 mg total) by mouth daily. 30 tablet 0   levothyroxine (SYNTHROID, LEVOTHROID) 50 MCG tablet Take 1 tablet (50 mcg total) by mouth daily before breakfast. 90 tablet 1   linagliptin (TRADJENTA) 5 MG TABS tablet 1 tablet     magnesium oxide (MAG-OX) 400 (240 Mg) MG tablet Take  1 tablet by mouth daily.     Multiple Vitamins-Minerals (AIRBORNE GUMMIES PO) Take 2 tablets by mouth every evening.     Multiple Vitamins-Minerals (AIRBORNE GUMMIES PO) Take 2 tablets by mouth daily.     Multiple Vitamins-Minerals (CENTRUM SILVER 50+WOMEN) TABS Take 1 tablet by mouth every evening.     nitroGLYCERIN (NITROSTAT) 0.4 MG SL tablet PLACE 1 TABLET (0.4 MG TOTAL) UNDER THE TONGUE EVERY 5 (FIVE) MINUTES AS NEEDED FOR CHEST PAIN. 25 tablet 1   ONETOUCH VERIO test strip 3 (three) times daily.     polyethylene glycol (MIRALAX / GLYCOLAX) 17 g packet 1 packet mixed with 8 ounces of fluid     pregabalin (LYRICA) 150 MG capsule Take 150 mg by mouth 2 (two) times daily.     sitaGLIPtin (JANUVIA) 25 MG tablet Take 1 tablet (25 mg total) by mouth daily. 90 tablet 3   torsemide (DEMADEX) 20 MG tablet Take 80 mg by mouth 2 (two) times daily.     ursodiol (ACTIGALL) 500 MG tablet Take 500 mg by mouth 3 (three) times daily.     vitamin B-12 (CYANOCOBALAMIN) 1000 MCG tablet Take 1,000 mcg by mouth daily.     vortioxetine HBr (TRINTELLIX) 10 MG TABS tablet daily.     zinc gluconate 50 MG tablet Take 50 mg by mouth daily.     No current facility-administered medications on file prior to encounter.   Wt Readings from Last 3 Encounters:  04/02/21 124.7 kg  03/15/21 122.5 kg  03/10/21 122.1 kg   BP 118/70   Pulse 82    Wt 124.7 kg   SpO2 92%   BMI 45.76 kg/m   PHYSICAL EXAM: General:  NAD. No resp difficulty, arrived in [facility] HEENT: Normal Neck: Supple. No JVD. Carotids 2+ bilat; no bruits. No lymphadenopathy or thryomegaly appreciated. Cor: PMI nondisplaced. Regular rate & rhythm. No rubs, gallops or murmurs. Lungs: Clear Abdomen: Obese, nontender, nondistended. No hepatosplenomegaly. No bruits or masses. Good bowel sounds. Extremities: No cyanosis, clubbing, rash, 1-2+ BLE edema Neuro: Alert & oriented x 3, cranial nerves grossly intact. Moves all 4 extremities w/o difficulty. Affect pleasant.  ReDs: 38%  ECG: SR with RBBB, no acute ST-T changes (personally reviewed).  ASSESSMENT AND PLAN:  CAD  - Known CAD w/ h/o remote MI in 2000 s/p BMS to RCA and again in 2007 w/ BMS to RCA  - No further chest pain. Would avoid cath given baseline CKD unless she has refractory angina.  - Echo this admit with decline in EF to 30-35% and new inferior WMA. Suspect progression in RCA disease. - ECG SR without acute changes today. - Continue long-acting nitrate. - Continue ASA + Plavix.  - Continue beta blocker. - Continue atorvastatin 80 mg daily.   2. Chronic Diastolic CHF--> Now with reduced EF 30-35%.  - Echo 9/21 EF 55-60%, RV normal  - Echo (10/22): EF down to 30-35%, Grade II DD.  - NYHA IIIb, confounded by morbid obesity and physical deconditioning. Volume up today, ReDs 38%; she feels her weight is up 10 lbs. - Take metolazone 2.5 mg + 10 mEq of KCL today and tomorrow. Will likely need weekly metolazone.  - Place compression hose. She has these at home. - Continue torsemide 80 mg bid. - Continue Imdur 15 mg daily + hydralazine 10 mg tid (Bidil 1 tab tid $56/30 day supply). - Continue carvedilol 12.5 mg bid. - No dig, SGLT2i, ARNI with CKD IV - Long discussion about limiting dietary  salt intake/eating out. - Limited options, discussed she will likely need HD for volume removal in the future.  -  BMET today, repeat at PCP later this week. If renal function relative stable after 2 doses of metolazone, will plan to add weekly metolazone 2.5 + 10 mEq of KCL on Tuesdays. (I will call her after her labs are resulted later in the week).   3. CKD IV - Baseline sCr ~3.5. - Followed by outpatient nephrology, Dr Hollie Salk  - BMET today, will follow closely with diuresis.   4. Type 2DM, uncontrolled  - poorly controlled, last hgb A1c 12.2 - No SGLT2i.  5. HTN - Controlled today. - Continue current medications.   Follow up with Dr. Colin Benton next month.  [name], FNP-BC 04/02/21

## 2021-04-02 NOTE — Patient Instructions (Signed)
EKG was done today  Labs were done today, if any labs come back abnormal the clinic will call you  Your physician recommends that you return for lab work in: please get BMET drawn at your doctors appointment with your primary care doctor on 04/04/2021  TAKE Metolazone 2.5 mg 1 tablet with 20 meq of Potassium 1 tablet today 04/02/2021 and tomorrow 04/03/2021..... Janett Billow will call you once your lab work is back to let you know how to further take [facility] physician recommends that you schedule a follow-up appointment in: keep scheduled appointment with Dr. Haroldine Laws  At the [facility], you and your health needs are our priority. As part of our continuing mission to provide you with exceptional heart care, we have created designated Provider Care Teams. These Care Teams include your primary Cardiologist (physician) and Advanced Practice Providers (APPs- Physician Assistants and Nurse Practitioners) who all work together to provide you with the care you need, when you need it.   You may see any of the following providers on your designated Care Team at your next follow up: Dr Glori Bickers Dr Haynes Kerns, NP Lyda Jester, [place] [facility] [place], [place] Audry Riles, PharmD   Please be sure to bring in all your medications bottles to every appointment.    If you have any questions or concerns before your next appointment please send [name] a message through [place] or call our office at [phone].    TO LEAVE A MESSAGE FOR THE NURSE SELECT OPTION 2, PLEASE LEAVE A MESSAGE INCLUDING: YOUR NAME DATE OF BIRTH CALL BACK NUMBER REASON FOR CALL**this is important as we prioritize the call backs  YOU WILL RECEIVE A CALL BACK THE SAME DAY AS LONG AS YOU CALL BEFORE 4:00 PM

## 2021-04-04 ENCOUNTER — Other Ambulatory Visit: Payer: Self-pay

## 2021-04-04 ENCOUNTER — Encounter: Payer: Self-pay | Admitting: Family Medicine

## 2021-04-04 ENCOUNTER — Ambulatory Visit (INDEPENDENT_AMBULATORY_CARE_PROVIDER_SITE_OTHER): Payer: HMO | Admitting: Family Medicine

## 2021-04-04 VITALS — BP 128/68 | HR 80 | Temp 97.3°F | Ht 65.0 in | Wt 273.0 lb

## 2021-04-04 DIAGNOSIS — N184 Chronic kidney disease, stage 4 (severe): Secondary | ICD-10-CM | POA: Diagnosis not present

## 2021-04-04 MED ORDER — VORTIOXETINE HBR 10 MG PO TABS: 10.0000 mg | ORAL_TABLET | Freq: Every day | ORAL | 5 refills | Status: DC

## 2021-04-04 NOTE — Progress Notes (Signed)
Subjective:    Patient ID: Susan [name], female    DOB: Apr 30, 1951, 70 y.o.   MRN: [id number]  HPI Admit date: 03/05/2021 Discharge date: 03/10/2021   Time spent: 67mins, more than 50% time spent on coordination of care.    Recommendations for Outpatient Follow-up:  F/u with PCP within a week  for hospital discharge follow up, repeat cbc/bmp at follow up F/u with cardiology F/u with nephrology Home health/DME order placed      Discharge Diagnoses:      Active Hospital Problems    Diagnosis Date Noted   NSTEMI (non-ST elevated myocardial infarction) ([place]) 03/05/2021     Resolved Hospital Problems  No resolved problems to display.      Discharge Condition: stable   Diet recommendation: renal diet Susan [name] modified        Filed Weights    03/08/21 1209 03/09/21 0552 03/10/21 0656  Weight: 119 kg 124 kg 122.1 kg      History of present illness: ( per admitting MD Dr Roosevelt Locks) Chief Complaint: Chest pain, SOB   HPI: Susan [name] is a 70 y.o. female with medical history significant of CAD with stenting in 5643, chronic diastolic CHF, CKD stage IV, HTN, HLD, PVD, IDDM on insulin pump, presented with worsening of chest pain and shortness of breath.   Patient started to have dry cough and shortness of breath since Friday.  Denies any fever chills.  Then Saturday, she developed pressure-like chest pain, episodic, centrally located, along with worsening of shortness of breath. She took nitroglycerin 2 times Saturday and 3 times on Sunday, despite, she continued to experience chest pains yesterday and worsening of shortness of breath and leg swelling.   She also states that recently fingerstick> 500, her endocrinologist has increased her insulin pump setting several times recently.   ED Course: Patient was found to have elevated blood pressure, O2 saturation borderline hypoxic, x-ray showed bilateral pulmonary congestion.  Troponins 247> 236.  EKG no new ST changes.   Heparin drip  started in the ED as well as 1 dose of IV Lasix.     Hospital Course:  Active Problems:   NSTEMI (non-ST elevated myocardial infarction) ([place])     Non-STEMI known CAD w/ h/o remote MI in 2000 s/p BMS to RCA and again in 2007 w/ BMS to RCA  Initially treated with heparin drip and nitro drip Cardiology consulted, no plan for cardiac cath due to CKDIV, recommend medical management Continue plavix and aspirin,  beta blocker,  high-intensity statin Chest pain resolved, She is cleared to discharge home by cardiology   Acute on chronic diastolic CHF, new reduced EF 30-35% , suspect from progressive CAD Initially received iv lasix 120mg  bid, diuresis held on 10/15 due to rising cr, cr improved, start back on torsemide Continue carvedilol 12.5 mg twice a day  - GDMT limted with CKD stage IV. No dig, sglt2i, spiro, ARNi with creatinine > 3.  Less edema, less sob, reports feeling better, she desires to go home, she is cleared to discharge home by cardiology     Hypokalemia Replaced, k wnl at discharge   mag 2.6   Insulin-dependent diabetes , Type 2 diabetes Uncontrolled, with hyperglycemia A1c 12.2 Seen by endocrinology Dr [name] in 01/2021 insulin pump was adjusted twice in September She and her husband is not sure about pump setting They agree stop insulin pump  and use insulin long acting / plus ssi Seen by diabetes coordinator here Return on insulin pump at  discharge   CKD 4 Bun/cr peaked at 113/3.59, trending down at discharge - she is advised to f/u with nephrology  -renal dosing meds      History of recurrent UTI On prophylactic Keflex chronically Report dysuria, urine appears cloudy on admission Ua culture with drug resistant enterobacter cloacae ( resistent to rocephin)  she received two doses of imipenem in the hospital , case discussed with ID pharmacist who recommend fosphamycin 3g q3d for three doses Prophylactic keflex discontinued as bacteria is tested resistant to  keflex   Constipation Continue MiraLAX, add Senokot Resolved     Patient is here today follow-up.  She denies any chest pain.  She denies any shortness of breath "I do something".  She does have swelling in both legs today however this is at her baseline.  She is taking 80 mg diuretic twice a day which is her baseline.  She denies any cough.  She denies any problem with.  Unfortunately her sugars continue to be highly variable.  She has no reported blood sugars Trulicity.  She is back on her insulin pump.  She states that she is checking her sugars 3 times a day and then doing a sliding scale but was placed on the numbers however the vast majority of her sugars are over 300 which makes me believe that her basal rate needs to be adjusted.  She also states that she is having to give herself 15 units of Novolin in the morning because her fasting blood sugars are under 200.  She does not have any follow-up scheduled with her endocrinologist.  She reports severe depression.  She states that in the past she has been on Lexapro, Zoloft, and she is now on Prozac.  She states that the medicine is no longer helping.  She reports anhedonia depression trouble sleeping lack of energy and poor focus.  She denies any suicidal ideation.  At that time, my plan was:  Patient wants to resume the Lyrica that she was taking due to the neuropathic pain in her legs.  She was previously on extremely high dose based on her renal function.  I want to start her back on 50 mg twice a day due to her poor kidney function.  Recheck renal function and CBC while the patient is here.  Discontinue Prozac and switch to Trintellix 10 mg a day and recheck in 3 weeks.  Blood pressure today is stable.  Patient does not appear fluid overloaded.  However I explained to the patient that the biggest risk factor she has for her heart failure, coronary artery disease, and kidney failure is her poorly controlled diabetes.  Therefore I insisted that  the patient call her endocrinologist and schedule a follow-up to make adjustments to her insulin pump given her uncontrolled sugars.  04/04/21 Patient states that the depression is doing better since she started Trintellix 10 mg a day.  The crying spells have subsided.  She denies any suicidal ideation.  She denies any nausea or vomiting or stomach upset.  Recently her cardiologist recommended that she take metolazone once a week for peripheral edema.  However they requested that we repeat a BMP here.  She has been on metolazone now since November 8.  Past Medical History:  Diagnosis Date   Acute MI ([place]) 1999; 2007   Anemia    hx   Anginal pain ([place])    Anxiety    ARF (acute renal failure) ([place]) 06/2017   [facility]  Arthritis    "generalized" (03/15/2014)   CAD (coronary artery disease)    MI in 2000 - MI  2007 - treated bare metal stent (no nuclear since then as 9/11)   Carotid artery disease (H[facility])    CHF (congestive heart failure) ([place])    Chronic diastolic heart failure ([place])    a) ECHO (08/2013) EF 55-60% and RV function nl b) RHC (08/2013) RA 4, RV 30/5/7, PA 25/10 (16), PCWP 7, Fick CO/CI 6.3/2.7, PVR 1.5 WU, PA 61 and 66%   Daily headache    "~ every other day; since I fell in June" (03/15/2014)   Depression    Dyslipidemia    Dyspnea    Exertional shortness of breath    HTN (hypertension)    Hypothyroidism    Neuropathy    Obesity    Osteoarthritis    Peripheral neuropathy    PONV (postoperative nausea and vomiting)    RBBB (right bundle branch block)    Old   Stroke ([place])    mini strokes   Syncope    likely due to low blood sugar   Tachycardia    Sinus tachycardia   Type II diabetes mellitus ([place])    Type II   Urinary incontinence    Venous insufficiency    Past Surgical History:  Procedure Laterality Date   ABDOMINAL HYSTERECTOMY  1980's   AMPUTATION Right 02/24/2018   Procedure: RIGHT FOOT GREAT TOE AND 2ND TOE AMPUTATION;  Surgeon: Newt Minion, MD;  Location: [facility];  Service: Orthopedics;  Laterality: Right;   AMPUTATION Right 04/30/2018   Procedure: RIGHT TRANSMETATARSAL AMPUTATION;  Surgeon: Newt Minion, MD;  Location: [facility];  Service: Orthopedics;  Laterality: Right;   BIOPSY  05/27/2020   Procedure: BIOPSY;  Surgeon: [name], DO;  [facility];  Service: Endoscopy;;   CATARACT EXTRACTION, BILATERAL Bilateral ?2013   COLONOSCOPY W/ POLYPECTOMY     COLONOSCOPY WITH PROPOFOL N/A 03/13/2019   Procedure: COLONOSCOPY WITH PROPOFOL;  Surgeon: Jerene Bears, MD;  Location: [facility];  Service: Gastroenterology;  Laterality: N/A;   [place]; 2007   "1 + 1"   ESOPHAGOGASTRODUODENOSCOPY (EGD) WITH PROPOFOL N/A 03/13/2019   Procedure: ESOPHAGOGASTRODUODENOSCOPY (EGD) WITH PROPOFOL;  Surgeon: Jerene Bears, MD;  Location: [facility];  Service: Gastroenterology;  Laterality: N/A;   ESOPHAGOGASTRODUODENOSCOPY (EGD) WITH PROPOFOL N/A 05/27/2020   Procedure: ESOPHAGOGASTRODUODENOSCOPY (EGD) WITH PROPOFOL;  Surgeon: [name], DO;  [facility];  Service: Endoscopy;  Laterality: N/A;   EYE SURGERY Bilateral    lazer   HEMOSTASIS CLIP PLACEMENT  03/13/2019   Procedure: HEMOSTASIS CLIP PLACEMENT;  Surgeon: Jerene Bears, MD;  [facility] ENDOSCOPY;  Service: Gastroenterology;;   KNEE ARTHROSCOPY Left 10/25/2006   POLYPECTOMY  03/13/2019   Procedure: POLYPECTOMY;  Surgeon: Jerene Bears, MD;  Location: [facility];  Service: Gastroenterology;;   RIGHT HEART CATH N/A 07/24/2017   Procedure: RIGHT HEART CATH;  Surgeon: Jolaine Artist, MD;  Location: [facility] CV LAB;  Service: Cardiovascular;  Laterality: N/A;   RIGHT HEART CATHETERIZATION N/A 09/22/2013   Procedure: RIGHT HEART CATH;  Surgeon: Jolaine Artist, MD;  Location: [facility] CATH LAB;  Service: Cardiovascular;  Laterality: N/A;   SHOULDER ARTHROSCOPY WITH OPEN ROTATOR CUFF REPAIR Right 03/14/2014    Procedure: RIGHT SHOULDER ARTHROSCOPY WITH BICEPS RELEASE, OPEN SUBSCAPULA REPAIR, OPEN SUPRASPINATUS REPAIR.;  Surgeon: Meredith Pel, MD;  Location[facility]  OR;  Service: Orthopedics;  Laterality: Right;   TOE AMPUTATION Right 02/24/2018   GREAT TOE AND 2ND TOE AMPUTATION   TUBAL LIGATION  1970's   Current Outpatient Medications on File Prior to Visit  Medication Sig Dispense Refill   acetaminophen (TYLENOL) 500 MG tablet Take 1,000 mg by mouth every 6 (six) hours as needed for moderate pain or headache.     albuterol (VENTOLIN HFA) 108 (90 Base) MCG/ACT inhaler TAKE 2 PUFFS BY MOUTH EVERY 6 HOURS AS NEEDED FOR WHEEZE OR SHORTNESS OF BREATH 8.5 g 1   allopurinol (ZYLOPRIM) 100 MG tablet Take 0.5 tablets (50 mg total) by mouth daily. 30 tablet 0   Ascorbic Acid (VITAMIN C) 1000 MG tablet Take 1,000 mg by mouth daily.     aspirin EC 81 MG tablet Take 1 tablet (81 mg total) by mouth daily with breakfast. 30 tablet 11   atorvastatin (LIPITOR) 80 MG tablet Take 1 tablet (80 mg total) by mouth daily. 30 tablet 5   carvedilol (COREG) 12.5 MG tablet TAKE 1 TABLET (12.5 MG TOTAL) BY MOUTH 2 (TWO) TIMES DAILY WITH A MEAL. 180 tablet 1   cholecalciferol (VITAMIN D) 1000 units tablet Take 1,000 Units by mouth daily with supper.      clopidogrel (PLAVIX) 75 MG tablet TAKE 1 TABLET BY MOUTH DAILY WITH BREAKFAST. 90 tablet 3   Cranberry 250 MG TABS Take 500 mg by mouth daily.     ferrous sulfate 325 (65 FE) MG tablet Take 1 tablet (325 mg total) by mouth daily with breakfast. Reported on 08/21/2015 60 tablet 2   FLUoxetine (PROZAC) 20 MG capsule TAKE 1 CAPSULE BY MOUTH EVERY DAY 90 capsule 2   fosfomycin (MONUROL) 3 g PACK See admin instructions.     guaiFENesin-dextromethorphan (ROBITUSSIN DM) 100-10 MG/5ML syrup Take 10 mLs by mouth every 4 (four) hours as needed for cough. 118 mL 0   hydrALAZINE (APRESOLINE) 10 MG tablet 1 tablet with food     hydrocortisone cream 0.5 % Apply 1 application topically 2  (two) times daily. 30 g 0   Insulin Disposable Pump (OMNIPOD DASH 5 PACK PODS) MISC Inject into the skin See admin instructions. Use continuously with Novolin R - change every 72 hours.     insulin NPH Human (HUMULIN N,NOVOLIN N) 100 UNIT/ML injection Inject 15 Units into the skin See admin instructions. Inject 15 units subcutaneously in the morning if CBG >300;     insulin regular (NOVOLIN R) 100 units/mL injection Inject into the skin See admin instructions. Manually add bolus to continuous dose via OmniPod 3 times daily per sliding scale (CBG 80-150 7 units, 151-200 9 units, 201-250 12 units, 251-300 14 units, 301- 400 17 units)     isosorbide mononitrate (IMDUR) 30 MG 24 hr tablet Take 0.5 tablets (15 mg total) by mouth daily. 30 tablet 0   levothyroxine (SYNTHROID, LEVOTHROID) 50 MCG tablet Take 1 tablet (50 mcg total) by mouth daily before breakfast. 90 tablet 1   linagliptin (TRADJENTA) 5 MG TABS tablet 1 tablet     magnesium oxide (MAG-OX) 400 (240 Mg) MG tablet Take 1 tablet by mouth daily.     metolazone (ZAROXOLYN) 2.5 MG tablet Take 1 tablet (2.5 mg total) by mouth daily. 90 tablet 3   Multiple Vitamins-Minerals (AIRBORNE GUMMIES PO) Take 2 tablets by mouth every evening.     Multiple Vitamins-Minerals (AIRBORNE GUMMIES PO) Take 2 tablets by mouth daily.     Multiple Vitamins-Minerals (CENTRUM SILVER 50+WOMEN)  TABS Take 1 tablet by mouth every evening.     nitroGLYCERIN (NITROSTAT) 0.4 MG SL tablet PLACE 1 TABLET (0.4 MG TOTAL) UNDER THE TONGUE EVERY 5 (FIVE) MINUTES AS NEEDED FOR CHEST PAIN. 25 tablet 1   ONETOUCH VERIO test strip 3 (three) times daily.     polyethylene glycol (MIRALAX / GLYCOLAX) 17 g packet 1 packet mixed with 8 ounces of fluid     potassium chloride SA (KLOR-CON) 20 MEQ tablet Take 1 tablet (20 mEq total) by mouth once a week. Take 20 meq with dose of metolazone 2.5 mg 30 tablet 1   pregabalin (LYRICA) 150 MG capsule Take 150 mg by mouth 2 (two) times daily.      sitaGLIPtin (JANUVIA) 25 MG tablet Take 1 tablet (25 mg total) by mouth daily. 90 tablet 3   torsemide (DEMADEX) 20 MG tablet Take 80 mg by mouth 2 (two) times daily.     ursodiol (ACTIGALL) 500 MG tablet Take 500 mg by mouth 3 (three) times daily.     vitamin B-12 (CYANOCOBALAMIN) 1000 MCG tablet Take 1,000 mcg by mouth daily.     vortioxetine HBr (TRINTELLIX) 10 MG TABS tablet daily.     zinc gluconate 50 MG tablet Take 50 mg by mouth daily.     No current facility-administered medications on file prior to visit.   Allergies  Allergen Reactions   Drug Ingredient [Cephalexin] Diarrhea    Pt reports heavy diarrhea with use of keflex   Codeine Nausea And Vomiting   Social History   Socioeconomic History   Marital status: Married    Spouse name: Not on file   Number of children: 3   Years of education: 12th   Highest education level: Not on file  Occupational History    Employer: UNEMPLOYED  Tobacco Use   Smoking status: Former    Packs/day: 3.00    Years: 32.00    Pack years: 96.00    Types: Cigarettes    Quit date: 10/24/1997    Years since quitting: 23.4   Smokeless tobacco: Never  Vaping Use   Vaping Use: Never used  Substance and Sexual Activity   Alcohol use: Not Currently    Comment: "might have 2-3 daiquiris in the summer"   Drug use: No   Sexual activity: Not Currently  Other Topics Concern   Not on file  Social History Narrative   Pt lives at home with her spouse.   Caffeine Use- 3 sodas daily   Social Determinants of Health   Financial Resource Strain: Low Risk    Difficulty of Paying Living Expenses: Not very hard  Food Insecurity: No Food Insecurity   Worried About Charity fundraiser in the Last Year: Never true   Ran Out of Food in the Last Year: Never true  Transportation Needs: No Transportation Needs   Lack of Transportation (Medical): No   Lack of Transportation (Non-Medical): No  Physical Activity: Not on file  Stress: Not on file  Social  Connections: Not on file  Intimate Partner Violence: Not on file     Review of Systems     Objective:   Physical Exam Constitutional:      General: She is not in acute distress.    Appearance: Normal appearance. She is obese. She is not ill-appearing or toxic-appearing.  Cardiovascular:     Rate and Rhythm: Normal rate and regular rhythm.     Heart sounds: Normal heart sounds. No murmur heard.  No friction rub. No gallop.  Pulmonary:     Effort: Pulmonary effort is normal. No respiratory distress.     Breath sounds: Normal breath sounds. No stridor. No wheezing, rhonchi or rales.  Abdominal:     General: Bowel sounds are normal.     Palpations: Abdomen is soft.  Musculoskeletal:     Right lower leg: Edema present.     Left lower leg: Edema present.  Neurological:     Mental Status: She is alert.          Assessment & Plan:  Stage 4 chronic kidney disease ([place]) - Plan: BASIC METABOLIC PANEL WITH GFR Depression seems to be doing better on Trintellix 10 mg a day.  We will continue this dose.  Given her history of stage IV chronic kidney disease I will repeat a BMP now patient is taking metolazone.  Want to ensure that her potassium and her creatinine are stable

## 2021-04-05 ENCOUNTER — Telehealth (HOSPITAL_COMMUNITY): Payer: Self-pay | Admitting: Family Medicine

## 2021-04-05 LAB — BASIC METABOLIC PANEL WITH GFR
BUN/Creatinine Ratio: 23 (calc) — ABNORMAL HIGH (ref 6–22)
BUN: 72 mg/dL — ABNORMAL HIGH (ref 7–25)
CO2: 29 mmol/L (ref 20–32)
Calcium: 9 mg/dL (ref 8.6–10.4)
Chloride: 100 mmol/L (ref 98–110)
Creat: 3.1 mg/dL — ABNORMAL HIGH (ref 0.60–1.00)
Glucose, Bld: 158 mg/dL — ABNORMAL HIGH (ref 65–99)
Potassium: 3.9 mmol/L (ref 3.5–5.3)
Sodium: 141 mmol/L (ref 135–146)
eGFR: 15 mL/min/{1.73_m2} — ABNORMAL LOW (ref >=60)

## 2021-04-05 NOTE — Telephone Encounter (Signed)
Spoke with Susan [name] and notified her follow up kidney function stable. She had not taken 2nd dose of metolazone yet, directed her to do so, with extra 10 of KCL.   She will add weekly metolazone to her diuretic regimen. She verbalized understanding and agreeable with plan.  [name], FNP-BC

## 2021-04-08 ENCOUNTER — Ambulatory Visit (INDEPENDENT_AMBULATORY_CARE_PROVIDER_SITE_OTHER): Payer: HMO | Admitting: *Deleted

## 2021-04-08 DIAGNOSIS — I5032 Chronic diastolic (congestive) heart failure: Secondary | ICD-10-CM

## 2021-04-08 DIAGNOSIS — E1165 Type 2 diabetes mellitus with hyperglycemia: Secondary | ICD-10-CM

## 2021-04-08 NOTE — Patient Instructions (Addendum)
Visit Information  Check and make sure you are taking tradjenta, check with your endocrinologist if there is any discrepancy Expect a call from [facility] pharmacist about trintellix costs Call your doctor if you gain 3 pounds overnight or 5 pounds in a week Follow low sodium diet Follow carbohydrate modified diet- be mindful of carbohydrate intake at each meal If you have chest pain, use nitroglycerin x 3, if no relief, call 911  The patient verbalized understanding of instructions, educational materials, and care plan provided today and declined offer to receive copy of patient instructions, educational materials, and care plan.   Telephone follow up appointment with care management team member scheduled for:  05/06/2021  Jacqlyn Larsen [facility], BSN RN Case Manager [facility] [phone]

## 2021-04-08 NOTE — Chronic Care Management (AMB) (Signed)
Chronic Care Management   CCM RN Visit Note  04/08/2021 Name: Susan [name] MRN: [id number] DOB: 19-Jun-1950  Subjective: Susan [name] is a 70 y.o. year old female who is a primary care patient of [name], [name], MD. The care management team was consulted for assistance with disease management and care coordination needs.    Engaged with patient by telephone for follow up visit in response to provider referral for case management and/or care coordination services.   Consent to Services:  The patient was given information about Chronic Care Management services, agreed to services, and gave verbal consent prior to initiation of services.  Please see initial visit note for detailed documentation.   Patient agreed to services and verbal consent obtained.   Assessment: Review of patient past medical history, allergies, medications, health status, including review of consultants reports, laboratory and other test data, was performed as part of comprehensive evaluation and provision of chronic care management services.   SDOH (Social Determinants of Health) assessments and interventions performed:    CCM Care Plan  Allergies  Allergen Reactions   Drug Ingredient [Cephalexin] Diarrhea    Pt reports heavy diarrhea with use of keflex   Codeine Nausea And Vomiting and Other (See Comments)    Outpatient Encounter Medications as of 04/08/2021  Medication Sig   acetaminophen (TYLENOL) 500 MG tablet Take 1,000 mg by mouth every 6 (six) hours as needed for moderate pain or headache.   albuterol (VENTOLIN HFA) 108 (90 Base) MCG/ACT inhaler TAKE 2 PUFFS BY MOUTH EVERY 6 HOURS AS NEEDED FOR WHEEZE OR SHORTNESS OF BREATH   allopurinol (ZYLOPRIM) 100 MG tablet Take 0.5 tablets (50 mg total) by mouth daily.   Ascorbic Acid (VITAMIN C) 1000 MG tablet Take 1,000 mg by mouth daily.   aspirin EC 81 MG tablet Take 1 tablet (81 mg total) by mouth daily with breakfast.   atorvastatin (LIPITOR) 80 MG tablet  Take 1 tablet (80 mg total) by mouth daily.   carvedilol (COREG) 12.5 MG tablet TAKE 1 TABLET (12.5 MG TOTAL) BY MOUTH 2 (TWO) TIMES DAILY WITH A MEAL.   cholecalciferol (VITAMIN D) 1000 units tablet Take 1,000 Units by mouth daily with supper.    clopidogrel (PLAVIX) 75 MG tablet TAKE 1 TABLET BY MOUTH DAILY WITH BREAKFAST.   Cranberry 250 MG TABS Take 500 mg by mouth daily.   ferrous sulfate 325 (65 FE) MG tablet Take 1 tablet (325 mg total) by mouth daily with breakfast. Reported on 08/21/2015   guaiFENesin-dextromethorphan (ROBITUSSIN DM) 100-10 MG/5ML syrup Take 10 mLs by mouth every 4 (four) hours as needed for cough.   hydrALAZINE (APRESOLINE) 10 MG tablet 1 tablet with food   hydrocortisone cream 0.5 % Apply 1 application topically 2 (two) times daily.   Insulin Disposable Pump (OMNIPOD DASH 5 PACK PODS) MISC Inject into the skin See admin instructions. Use continuously with Novolin R - change every 72 hours.   insulin NPH Human (HUMULIN N,NOVOLIN N) 100 UNIT/ML injection Inject 15 Units into the skin See admin instructions. Inject 15 units subcutaneously in the morning if CBG >300;   insulin regular (NOVOLIN R) 100 units/mL injection Inject into the skin See admin instructions. Manually add bolus to continuous dose via OmniPod 3 times daily per sliding scale (CBG 80-150 7 units, 151-200 9 units, 201-250 12 units, 251-300 14 units, 301- 400 17 units)   isosorbide mononitrate (IMDUR) 30 MG 24 hr tablet Take 0.5 tablets (15 mg total) by mouth daily.  levothyroxine (SYNTHROID, LEVOTHROID) 50 MCG tablet Take 1 tablet (50 mcg total) by mouth daily before breakfast.   linagliptin (TRADJENTA) 5 MG TABS tablet 1 tablet   magnesium oxide (MAG-OX) 400 (240 Mg) MG tablet Take 1 tablet by mouth daily.   metolazone (ZAROXOLYN) 2.5 MG tablet Take 1 tablet (2.5 mg total) by mouth daily.   Multiple Vitamins-Minerals (AIRBORNE GUMMIES PO) Take 2 tablets by mouth daily.   Multiple Vitamins-Minerals (CENTRUM  SILVER 50+WOMEN) TABS Take 1 tablet by mouth every evening.   nitroGLYCERIN (NITROSTAT) 0.4 MG SL tablet PLACE 1 TABLET (0.4 MG TOTAL) UNDER THE TONGUE EVERY 5 (FIVE) MINUTES AS NEEDED FOR CHEST PAIN.   ONETOUCH VERIO test strip 3 (three) times daily.   polyethylene glycol (MIRALAX / GLYCOLAX) 17 g packet 1 packet mixed with 8 ounces of fluid   potassium chloride SA (KLOR-CON) 20 MEQ tablet Take 1 tablet (20 mEq total) by mouth once a week. Take 20 meq with dose of metolazone 2.5 mg   pregabalin (LYRICA) 150 MG capsule Take 150 mg by mouth 2 (two) times daily.   sitaGLIPtin (JANUVIA) 25 MG tablet Take 1 tablet (25 mg total) by mouth daily.   torsemide (DEMADEX) 20 MG tablet Take 80 mg by mouth 2 (two) times daily.   ursodiol (ACTIGALL) 500 MG tablet Take 500 mg by mouth 3 (three) times daily.   vitamin B-12 (CYANOCOBALAMIN) 1000 MCG tablet Take 1,000 mcg by mouth daily.   vortioxetine HBr (TRINTELLIX) 10 MG TABS tablet Take 1 tablet (10 mg total) by mouth daily.   zinc gluconate 50 MG tablet Take 50 mg by mouth daily.   FLUoxetine (PROZAC) 20 MG capsule TAKE 1 CAPSULE BY MOUTH EVERY DAY (Patient not taking: Reported on 04/08/2021)   fosfomycin (MONUROL) 3 g PACK See admin instructions. (Patient not taking: Reported on 04/08/2021)   Multiple Vitamins-Minerals (AIRBORNE GUMMIES PO) Take 2 tablets by mouth every evening. (Patient not taking: Reported on 04/08/2021)   No facility-administered encounter medications on file as of 04/08/2021.    Patient Active Problem List   Diagnosis Date Noted   NSTEMI (non-ST elevated myocardial infarction) ([place]) 03/05/2021   Acute renal failure superimposed on stage 4 chronic kidney disease ([place]) 08/22/2020   Hypoalbuminemia 05/25/2020   GERD (gastroesophageal reflux disease) 05/25/2020   Pressure injury of skin 05/17/2020   Type 2 diabetes mellitus with diabetic polyneuropathy, with long-term current use of insulin ([place]) 03/07/2020   Obesity, Class III, BMI  40-49.9 (morbid obesity) ([place]) 03/07/2020   Common bile duct (CBD) obstruction 05/28/2019   Benign neoplasm of ascending colon    Benign neoplasm of transverse colon    Benign neoplasm of descending colon    Benign neoplasm of sigmoid colon    Gastric polyps    Hyperkalemia 03/11/2019   Prolonged QT interval 03/11/2019   Onychomycosis 06/21/2018   Osteomyelitis of second toe of right foot ([place])    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 (HCC)    PVD (peripheral vascular disease) ([place]) 10/26/2017   E-coli UTI 07/27/2017   Hypothyroidism 07/27/2017   PAH (pulmonary artery hypertension) (HCC)    Impaired ambulation 07/19/2017   Leg cramps 02/27/2017   Peripheral edema 01/12/2017   Diabetic neuropathy ([place]) 11/12/2016   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 mellitus ([place]) 10/24/2015   Anemia 10/03/2015   Generalized anxiety disorder 10/03/2015   Insomnia 10/03/2015   Hyperglycemia due to diabetes mellitus ([place]) 06/07/2015   Chronic diastolic CHF (congestive  heart failure) ([place]) 06/07/2015   Non compliance with medical treatment 04/17/2014   Rotator cuff tear 03/14/2014   Class 3 obesity ([place]) 09/23/2013   Hypotension 12/25/2012   Urinary incontinence    MDD (major depressive disorder) 11/12/2010   RBBB (right bundle branch block)    Coronary artery disease    Hyperlipemia 01/22/2009   Essential hypertension 01/22/2009    Conditions to be addressed/monitored:CHF and DMII  Care Plan : RN Care Manager plan of care  Updates made by Kassie Mends, RN since 04/08/2021 12:00 AM     Problem: No plan of care established for management of chronic disease states (CHF, DM2, HTN, HLD, CKD stage 4)   Priority: High     Long-Range Goal: Development of plan of care for chronic disease management (CHF, DM2, HTN, CAD, HLD, CKD stage 4)   Start Date: 04/08/2021  Expected End Date: 10/05/2021  Priority: High  Note:   Current Barriers:  Knowledge Deficits related to plan  of care for management of CHF and DMII  Financial Constraints.  Pt reports today she is unable to afford trintellix 10mg  tablet at $100/ per month Knowledge deficit related to basic heart failure pathophysiology and self care management- pt lives with spouse, pt is independent in most all aspects of her care, has walker and cane to use as needed, continues to drive. Pt reports she forgets sometimes to weigh, weight today 275 pounds. Pt in hospital 03/05/21 for NSTEMI, pt reports she has had no chest pain since hospitalization, has nitroglycerin on hand, reports she is taking medications as prescribed.  Patient reports she is taking new medication trintellix and this has helped with depression and she would like to continue taking. Patient reports she checks blood sugar 3 x per day with ranges 70-231.  RNCM Clinical Goal(s):  Patient will verbalize understanding of plan for management of CHF and DMII as evidenced by review of EHR, pt report. take all medications exactly as prescribed and will call provider for medication related questions as evidenced by review of EHR, per pt report and collaboration with CCM pharmacist.    attend all scheduled medical appointments: 11/15 Dr. Sharol Given, 12/12 Dr. Haroldine Laws, 12/15 CCM pharmacist as evidenced by review of EHR, pt report and        through collaboration with RN Care manager, provider, and care team.   Interventions: 1:1 collaboration with primary care provider regarding development and update of comprehensive plan of care as evidenced by provider attestation and co-signature Inter-disciplinary care team collaboration (see longitudinal plan of care) Evaluation of current treatment plan related to  self management and patient's adherence to plan as established by provider  Diabetes Interventions: Assessed patient's understanding of A1c goal: <7% Reviewed medications with patient and discussed importance of medication adherence; Counseled on importance of  regular laboratory monitoring as prescribed; Reviewed scheduled/upcoming provider appointments including: 11/15 Dr. Sharol Given, 12/12  Dr. Haroldine Laws,  12/15 CCM pharmaicst; Review of patient status, including review of consultants reports, relevant laboratory and other test results, and medications completed; In basket sent to CCM pharmacist reporting patient is unable to afford trintellix 10mg  at $100/ per month Reinforced carbohydrate modified diet Asked patient to verify if she is taking tradjenta 5mg  (pt was unsure on the phone call today and will check) Reviewed symptoms hypo/hyperglycemia and actions to take Lab Results  Component Value Date   HGBA1C 12.2 (H) 03/05/2021  Heart Failure Interventions: Discussed importance of daily weight and advised patient to weigh and record daily;  Reviewed role of diuretics in prevention of fluid overload and management of heart failure; Provided patient with education about the role of exercise in the management of heart failure; Reinforced low sodium diet Reinforced HF action plan with emphasis on yellow zone Reviewed importance of having nitroglycerin on hand and reviewed instructions on how to take and calling 911 if no relief  Patient Goals/Self-Care Activities: Patient will self administer medications as prescribed as evidenced by self report/primary caregiver report  Patient will attend all scheduled provider appointments as evidenced by clinician review of documented attendance to scheduled appointments and patient/caregiver report Patient will call provider office for new concerns or questions as evidenced by review of documented incoming telephone call notes and patient report  Check and make sure you are taking tradjenta, check with your endocrinologist if there is any discrepancy Expect a call from CCM pharmacist about trintellix costs Call your doctor if you gain 3 pounds overnight or 5 pounds in a week Follow low sodium diet Follow  carbohydrate modified diet- be mindful of carbohydrate intake at each meal If you have chest pain, use nitroglycerin x 3, if no relief, call 911  Follow Up Plan:  Telephone follow up appointment with care management team member scheduled for:  05/06/2021       Plan:Telephone follow up appointment with care management team member scheduled for:  05/06/2021  Jacqlyn Larsen [facility], BSN RN Case Manager [facility] [phone]

## 2021-04-09 ENCOUNTER — Ambulatory Visit: Payer: HMO | Admitting: Orthopedic Surgery

## 2021-04-09 DIAGNOSIS — Z89431 Acquired absence of right foot: Secondary | ICD-10-CM

## 2021-04-09 DIAGNOSIS — B351 Tinea unguium: Secondary | ICD-10-CM

## 2021-04-17 DIAGNOSIS — I5032 Chronic diastolic (congestive) heart failure: Secondary | ICD-10-CM | POA: Diagnosis not present

## 2021-04-24 DIAGNOSIS — E1159 Type 2 diabetes mellitus with other circulatory complications: Secondary | ICD-10-CM

## 2021-04-24 DIAGNOSIS — Z794 Long term (current) use of insulin: Secondary | ICD-10-CM | POA: Diagnosis not present

## 2021-04-24 DIAGNOSIS — Z7984 Long term (current) use of oral hypoglycemic drugs: Secondary | ICD-10-CM

## 2021-04-24 DIAGNOSIS — I5032 Chronic diastolic (congestive) heart failure: Secondary | ICD-10-CM

## 2021-04-26 ENCOUNTER — Ambulatory Visit: Payer: HMO | Admitting: Nurse Practitioner

## 2021-04-26 ENCOUNTER — Encounter: Payer: Self-pay | Admitting: Orthopedic Surgery

## 2021-04-26 ENCOUNTER — Other Ambulatory Visit (HOSPITAL_COMMUNITY): Payer: Self-pay | Admitting: Family Medicine

## 2021-04-26 NOTE — Progress Notes (Signed)
Office Visit Note   Patient: Susan [name]           Date of Birth: 1950/08/07           MRN: [id number] Visit Date: 04/09/2021              Requested by: [name], MD [address] [place],  [place] [address] PCP: [name], MD  Chief Complaint  Patient presents with   Right Leg - Wound Check   Left Leg - Wound Check   Left Foot - Follow-up    Nail trimming      HPI: Patient is a 70 year old woman who presents for evaluation of chronic venous insufficiency both legs with recurrent ulcers she is status post transmetatarsal amputation on the right with onychomycotic nails on the left.  Assessment & Plan: Visit Diagnoses:  1. Onychomycosis   2. History of transmetatarsal amputation of right foot ([place])     Plan: Nails were trimmed x5 calluses pared x2.  Recommended using a extra-large compression sock.  Follow-Up Instructions: Return in about 3 months (around 07/10/2021).   Ortho Exam  Patient is alert, oriented, no adenopathy, well-dressed, normal affect, normal respiratory effort. Examination patient has thickened discolored onychomycotic nails on the left she is unable to safely trim the nails on her own and the nails were trimmed x5 there are 2 hypertrophic calluses these were also pared without complications.  Patient has venous stasis swelling of both legs without ulcers the left leg is 46 cm in circumference right leg is 45 cm in circumference.  She is using a double extra-large sock  Imaging: No results found. No images are attached to the encounter.  Labs: Lab Results  Component Value Date   HGBA1C 12.2 (H) 03/05/2021   HGBA1C 10.3 (H) 08/26/2020   HGBA1C 11.0 (H) 08/14/2020   CRP 0.7 08/22/2020   CRP 1.5 (H) 03/13/2020   CRP 1.9 (H) 03/12/2020   LABURIC 4.7 08/09/2020   LABURIC 9.1 (H) 10/12/2018   LABURIC 9.0 (H) 03/06/2018   REPTSTATUS 03/09/2021 FINAL 03/06/2021   CULT >=100,000 COLONIES/mL ENTEROBACTER CLOACAE (A) 03/06/2021    LABORGA ENTEROBACTER CLOACAE (A) 03/06/2021     Lab Results  Component Value Date   ALBUMIN 2.8 (L) 08/22/2020   ALBUMIN 3.2 (L) 08/21/2020   ALBUMIN 2.3 (L) 05/28/2020    Lab Results  Component Value Date   MG 2.6 (H) 03/07/2021   MG 2.2 08/22/2020   MG 1.9 08/19/2020   No results found for: VD25OH  No results found for: PREALBUMIN CBC EXTENDED Latest Ref Rng & Units 03/15/2021 03/09/2021 03/07/2021  WBC 3.8 - 10.8 Thousand/uL 9.0 6.5 7.5  RBC 3.80 - 5.10 Million/uL 4.52 3.95 4.26  HGB 11.7 - 15.5 g/dL 12.2 10.6(L) 11.4(L)  HCT 35.0 - 45.0 % 38.7 34.2(L) 36.1  PLT 140 - 400 Thousand/uL 182 159 158  NEUTROABS 1,500 - 7,800 cells/uL 6,984 - -  LYMPHSABS 850 - 3,900 cells/uL 864 - -     There is no height or weight on file to calculate BMI.  Orders:  No orders of the defined types were placed in this encounter.  No orders of the defined types were placed in this encounter.    Procedures: No procedures performed  Clinical Data: No additional findings.  ROS:  All other systems negative, except as noted in the HPI. Review of Systems  Objective: Vital Signs: There were no vitals taken for this visit.  Specialty Comments:  No specialty comments available.  PMFS History: Patient Active Problem List   Diagnosis Date Noted   NSTEMI (non-ST elevated myocardial infarction) ([place]) 03/05/2021   Acute renal failure superimposed on stage 4 chronic kidney disease ([place]) 08/22/2020   Hypoalbuminemia 05/25/2020   GERD (gastroesophageal reflux disease) 05/25/2020   Pressure injury of skin 05/17/2020   Type 2 diabetes mellitus with diabetic polyneuropathy, with long-term current use of insulin ([place]) 03/07/2020   Obesity, Class III, BMI 40-49.9 (morbid obesity) ([place]) 03/07/2020   Common bile duct (CBD) obstruction 05/28/2019   Benign neoplasm of ascending colon    Benign neoplasm of transverse colon    Benign neoplasm of descending colon    Benign neoplasm of sigmoid  colon    Gastric polyps    Hyperkalemia 03/11/2019   Prolonged QT interval 03/11/2019   Onychomycosis 06/21/2018   Osteomyelitis of second toe of right foot ([place])    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 (HCC)    PVD (peripheral vascular disease) ([place]) 10/26/2017   E-coli UTI 07/27/2017   Hypothyroidism 07/27/2017   PAH (pulmonary artery hypertension) (HCC)    Impaired ambulation 07/19/2017   Leg cramps 02/27/2017   Peripheral edema 01/12/2017   Diabetic neuropathy ([place]) 11/12/2016   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 mellitus ([place]) 10/24/2015   Anemia 10/03/2015   Generalized anxiety disorder 10/03/2015   Insomnia 10/03/2015   Hyperglycemia due to diabetes mellitus ([place]) 06/07/2015   Chronic diastolic CHF (congestive heart failure) ([place]) 06/07/2015   Non compliance with medical treatment 04/17/2014   Rotator cuff tear 03/14/2014   Class 3 obesity ([place]) 09/23/2013   Hypotension 12/25/2012   Urinary incontinence    MDD (major depressive disorder) 11/12/2010   RBBB (right bundle branch block)    Coronary artery disease    Hyperlipemia 01/22/2009   Essential hypertension 01/22/2009   Past Medical History:  Diagnosis Date   Acute MI ([place]) 1999; 2007   Anemia    hx   Anginal pain ([place])    Anxiety    ARF (acute renal failure) ([place]) 06/2017   [facility]   Arthritis    "generalized" (03/15/2014)   CAD (coronary artery disease)    MI in 2000 - MI  2007 - treated bare metal stent (no nuclear since then as 9/11)   Carotid artery disease ([place])    CHF (congestive heart failure) ([place])    Chronic diastolic heart failure ([place])    a) ECHO (08/2013) EF 55-60% and RV function nl b) RHC (08/2013) RA 4, RV 30/5/7, PA 25/10 (16), PCWP 7, [place]ick CO/CI 6.3/2.7, PVR 1.5 WU, PA 61 and 66%   Daily headache    "~ every other day; since I fell in June" (03/15/2014)   Depression    Dyslipidemia    Dyspnea    Exertional shortness of breath    HTN (hypertension)     Hypothyroidism    Neuropathy    Obesity    Osteoarthritis    Peripheral neuropathy    PONV (postoperative nausea and vomiting)    RBBB (right bundle branch block)    Old   Stroke ([place])    mini strokes   Syncope    likely due to low blood sugar   Tachycardia    Sinus tachycardia   Type II diabetes mellitus (HCC)    Type II   Urinary incontinence    Venous insufficiency     Family History  Problem Relation Age of  Onset   Heart attack Mother 74    Past Surgical History:  Procedure Laterality Date   ABDOMINAL HYSTERECTOMY  1980's   AMPUTATION Right 02/24/2018   Procedure: RIGHT FOOT GREAT TOE AND 2ND TOE AMPUTATION;  Surgeon: Newt Minion, MD;  Location: [facility];  Service: Orthopedics;  Laterality: Right;   AMPUTATION Right 04/30/2018   Procedure: RIGHT TRANSMETATARSAL AMPUTATION;  Surgeon: Newt Minion, MD;  Location: [facility];  Service: Orthopedics;  Laterality: Right;   BIOPSY  05/27/2020   Procedure: BIOPSY;  Surgeon: [name], DO;  [facility];  Service: Endoscopy;;   CATARACT EXTRACTION, BILATERAL Bilateral ?2013   COLONOSCOPY W/ POLYPECTOMY     COLONOSCOPY WITH PROPOFOL N/A 03/13/2019   Procedure: COLONOSCOPY WITH PROPOFOL;  Surgeon: Jerene Bears, MD;  Location: [facility];  Service: Gastroenterology;  Laterality: N/A;   [place]; 2007   "1 + 1"   ESOPHAGOGASTRODUODENOSCOPY (EGD) WITH PROPOFOL N/A 03/13/2019   Procedure: ESOPHAGOGASTRODUODENOSCOPY (EGD) WITH PROPOFOL;  Surgeon: Jerene Bears, MD;  Location: [facility];  Service: Gastroenterology;  Laterality: N/A;   ESOPHAGOGASTRODUODENOSCOPY (EGD) WITH PROPOFOL N/A 05/27/2020   Procedure: ESOPHAGOGASTRODUODENOSCOPY (EGD) WITH PROPOFOL;  Surgeon: [name], DO;  [facility];  Service: Endoscopy;  Laterality: N/A;   EYE SURGERY Bilateral    [place]   HEMOSTASIS CLIP PLACEMENT  03/13/2019   Procedure: HEMOSTASIS CLIP PLACEMENT;  Surgeon: Jerene Bears, MD;  [facility];  Service: Gastroenterology;;   KNEE ARTHROSCOPY Left 10/25/2006   POLYPECTOMY  03/13/2019   Procedure: POLYPECTOMY;  Surgeon: Jerene Bears, MD;  Location: [facility];  Service: Gastroenterology;;   RIGHT HEART CATH N/A 07/24/2017   Procedure: RIGHT HEART CATH;  Surgeon: Jolaine Artist, MD;  Location: [facility];  Service: Cardiovascular;  Laterality: N/A;   RIGHT HEART CATHETERIZATION N/A 09/22/2013   Procedure: RIGHT HEART CATH;  Surgeon: Jolaine Artist, MD;  Location: [facility];  Service: Cardiovascular;  Laterality: N/A;   SHOULDER ARTHROSCOPY WITH OPEN ROTATOR CUFF REPAIR Right 03/14/2014   Procedure: RIGHT SHOULDER ARTHROSCOPY WITH BICEPS RELEASE, OPEN SUBSCAPULA REPAIR, OPEN SUPRASPINATUS REPAIR.;  Surgeon: Meredith Pel, MD;  Location: [facility];  Service: Orthopedics;  Laterality: Right;   TOE AMPUTATION Right 02/24/2018   GREAT TOE AND 2ND TOE AMPUTATION   TUBAL LIGATION  1970's   Social History   Occupational History    Employer: UNEMPLOYED  Tobacco Use   Smoking status: Former    Packs/day: 3.00    Years: 32.00    Pack years: 96.00    Types: Cigarettes    Quit date: 10/24/1997    Years since quitting: 23.5   Smokeless tobacco: Never  Vaping Use   Vaping Use: Never used  Substance and Sexual Activity   Alcohol use: Not Currently    Comment: "might have 2-3 daiquiris in the summer"   Drug use: No   Sexual activity: Not Currently

## 2021-05-05 NOTE — Progress Notes (Signed)
Advanced Heart Failure Clinic Note   Primary Physician [name], MD HF Cardiologist: Glori Bickers, MD  Nephrology: Dr Hollie Salk  Reason for Visit:  F/u for Chronic Diastolic Heart Failure  HPI:  Susan [name] is a 70 y.o. female with morbid obesity, RBBB, DM2, Hypothyroidism diastolic heart failure, CAD MI 2000/2007 BMS 2007, TIA, CKD Stage IV and PAD s/p R transmetatarsal amputation.   Has had frequent hospital admits since 2017.  Had home sleep study 07/25/20 and this was negative for OSA.   Admitted 3/22  with AKI and progressive volume overloaded in setting of recurrent UTI and probable ATN. She was placed on IV abx and IV lasix w/ improved renal function and volume status. SCr was down to 1.78 day of d/c. PT recommended SNF however pt declined. She was discharged home on 3/27 but readmitted 2 days later for urinary retention and recurrent AKI. Marland Kitchen  Admitted 10/22 with NSTEMI (hstrop 247). Echo with decline in EF to 30-35% and new inferior WMA. Suspect progression in RCA disease.Managed medically due to CKD IV (Cr ~3.5).  AHF consulted for management. GDMT limited by renal disease. Hospitalization c/b AKI and UTI. Discharge weight 268 lbs.  Today she returns for follow up. Has good days and bad days. Still SOB with minimal exertion. + edema and orthopnea. Occasional CP but not frequent. Takes torsemide 80 bid + metolazone every Wednesdays. Feels like metolazone not working as well. Blood sugars going back up for past few weeks as she forgets to take insulin at night. Has been out of hydralazine and imdur for 1 week.     Cardiac Studies: Echo (10/22): EF 30-35% Echo (9/21): EF 55-60%, RV ok Echo (10/19): EF 55-60%, Grade I DD  RHC (07/24/17): showed mild PAH with evidence of RV strain likely due to OHS/OSA.  Findings: RA = 17 RV = 48/15 PA = 49/11 (30) PCW = 18 Fick cardiac output/index = 7.0/3.0 PVR = 0.5 WU Ao sat = 97% PA sat = 62%, 64% Assessment: 1. Normal  left-sided pressures 2. Mild PAH with evidence of RV strain likely due to OHS/OSA 3. Normal cardiac output  Lexiscan w/ stress echo (2018): EF 68%, low risk  LHC (2007): BMS distal RCA Cardiolite (03/2002): EF of 51% but no ischemia LHC (2000): BMS RCA   ROS: All systems reviewed and negative except as per HPI.   Current Meds  Medication Sig   acetaminophen (TYLENOL) 500 MG tablet Take 1,000 mg by mouth every 6 (six) hours as needed for moderate pain or headache.   albuterol (VENTOLIN HFA) 108 (90 Base) MCG/ACT inhaler TAKE 2 PUFFS BY MOUTH EVERY 6 HOURS AS NEEDED FOR WHEEZE OR SHORTNESS OF BREATH   allopurinol (ZYLOPRIM) 100 MG tablet Take 0.5 tablets (50 mg total) by mouth daily.   Ascorbic Acid (VITAMIN C) 1000 MG tablet Take 1,000 mg by mouth daily.   aspirin EC 81 MG tablet Take 1 tablet (81 mg total) by mouth daily with breakfast.   atorvastatin (LIPITOR) 80 MG tablet Take 1 tablet (80 mg total) by mouth daily.   carvedilol (COREG) 12.5 MG tablet TAKE 1 TABLET (12.5 MG TOTAL) BY MOUTH 2 (TWO) TIMES DAILY WITH A MEAL.   cholecalciferol (VITAMIN D) 1000 units tablet Take 1,000 Units by mouth daily with supper.    clopidogrel (PLAVIX) 75 MG tablet TAKE 1 TABLET BY MOUTH DAILY WITH BREAKFAST.   Cranberry 250 MG TABS Take 500 mg by mouth daily.   ferrous sulfate 325 (  65 FE) MG tablet Take 1 tablet (325 mg total) by mouth daily with breakfast. Reported on 08/21/2015   guaiFENesin-dextromethorphan (ROBITUSSIN DM) 100-10 MG/5ML syrup Take 10 mLs by mouth every 4 (four) hours as needed for cough.   hydrALAZINE (APRESOLINE) 10 MG tablet 1 tablet with food   hydrocortisone cream 0.5 % Apply 1 application topically 2 (two) times daily.   Insulin Disposable Pump (OMNIPOD DASH 5 PACK PODS) MISC Inject into the skin See admin instructions. Use continuously with Novolin R - change every 72 hours.   insulin NPH Human (HUMULIN N,NOVOLIN N) 100 UNIT/ML injection Inject 15 Units into the skin See admin  instructions. Inject 15 units subcutaneously in the morning if CBG >300;   insulin regular (NOVOLIN R) 100 units/mL injection Inject into the skin See admin instructions. Manually add bolus to continuous dose via OmniPod 3 times daily per sliding scale (CBG 80-150 7 units, 151-200 9 units, 201-250 12 units, 251-300 14 units, 301- 400 17 units)   isosorbide mononitrate (IMDUR) 30 MG 24 hr tablet Take 0.5 tablets (15 mg total) by mouth daily.   KLOR-CON M20 20 MEQ tablet TAKE 1 TABLET (20 MEQ TOTAL) BY MOUTH ONCE A WEEK. TAKE 20 MEQ WITH DOSE OF METOLAZONE 2.5 MG   levothyroxine (SYNTHROID, LEVOTHROID) 50 MCG tablet Take 1 tablet (50 mcg total) by mouth daily before breakfast.   linagliptin (TRADJENTA) 5 MG TABS tablet 1 tablet   magnesium oxide (MAG-OX) 400 (240 Mg) MG tablet Take 1 tablet by mouth daily.   metolazone (ZAROXOLYN) 2.5 MG tablet Take 1 tablet (2.5 mg total) by mouth daily.   Multiple Vitamins-Minerals (AIRBORNE GUMMIES PO) Take 2 tablets by mouth every evening.   Multiple Vitamins-Minerals (AIRBORNE GUMMIES PO) Take 2 tablets by mouth daily.   Multiple Vitamins-Minerals (CENTRUM SILVER 50+WOMEN) TABS Take 1 tablet by mouth every evening.   nitroGLYCERIN (NITROSTAT) 0.4 MG SL tablet PLACE 1 TABLET (0.4 MG TOTAL) UNDER THE TONGUE EVERY 5 (FIVE) MINUTES AS NEEDED FOR CHEST PAIN.   ONETOUCH VERIO test strip 3 (three) times daily.   polyethylene glycol (MIRALAX / GLYCOLAX) 17 g packet 1 packet mixed with 8 ounces of fluid   pregabalin (LYRICA) 150 MG capsule Take 150 mg by mouth 2 (two) times daily.   sitaGLIPtin (JANUVIA) 25 MG tablet Take 1 tablet (25 mg total) by mouth daily.   torsemide (DEMADEX) 20 MG tablet Take 80 mg by mouth 2 (two) times daily.   ursodiol (ACTIGALL) 500 MG tablet Take 500 mg by mouth 3 (three) times daily.   vitamin B-12 (CYANOCOBALAMIN) 1000 MCG tablet Take 1,000 mcg by mouth daily.   vortioxetine HBr (TRINTELLIX) 10 MG TABS tablet Take 1 tablet (10 mg total) by  mouth daily.   zinc gluconate 50 MG tablet Take 50 mg by mouth daily.   Allergies  Allergen Reactions   Drug Ingredient [Cephalexin] Diarrhea    Pt reports heavy diarrhea with use of keflex   Codeine Nausea And Vomiting and Other (See Comments)   Past Medical History:  Diagnosis Date   Acute MI ([place]) 1999; 2007   Anemia    hx   Anginal pain ([place])    Anxiety    ARF (acute renal failure) ([place]) 06/2017   [facility] Asso   Arthritis    "generalized" (03/15/2014)   CAD (coronary artery disease)    MI in 2000 - MI  2007 - treated bare metal stent (no nuclear since then as 9/11)   Carotid artery  disease ([place])    CHF (congestive heart failure) ([facility])    Chronic diastolic heart failure ([place])    a) ECHO (08/2013) EF 55-60% and RV function nl b) RHC (08/2013) RA 4, RV 30/5/7, PA 25/10 (16), PCWP 7, Fick CO/CI 6.3/2.7, PVR 1.5 WU, PA 61 and 66%   Daily headache    "~ every other day; since I fell in June" (03/15/2014)   Depression    Dyslipidemia    Dyspnea    Exertional shortness of breath    HTN (hypertension)    Hypothyroidism    Neuropathy    Obesity    Osteoarthritis    Peripheral neuropathy    PONV (postoperative nausea and vomiting)    RBBB (right bundle branch block)    Old   Stroke ([place])    mini strokes   Syncope    likely due to low blood sugar   Tachycardia    Sinus tachycardia   Type II diabetes mellitus (H[facility])    Type II   Urinary incontinence    Venous insufficiency    Family History  Problem Relation Age of Onset   Heart attack Mother 82   Past Surgical History:  Procedure Laterality Date   ABDOMINAL HYSTERECTOMY  1980's   AMPUTATION Right 02/24/2018   Procedure: RIGHT FOOT GREAT TOE AND 2ND TOE AMPUTATION;  Surgeon: Newt Minion, MD;  Location: [place];  Service: Orthopedics;  Laterality: Right;   AMPUTATION Right 04/30/2018   Procedure: RIGHT TRANSMETATARSAL AMPUTATION;  Surgeon: Newt Minion, MD;  Location: [place];  Service: Orthopedics;   Laterality: Right;   BIOPSY  05/27/2020   Procedure: BIOPSY;  Surgeon: [name], DO;  [facility];  Service: Endoscopy;;   CATARACT EXTRACTION, BILATERAL Bilateral ?2013   COLONOSCOPY W/ POLYPECTOMY     COLONOSCOPY WITH PROPOFOL N/A 03/13/2019   Procedure: COLONOSCOPY WITH PROPOFOL;  Surgeon: Jerene Bears, MD;  Location: [place];  Service: Gastroenterology;  Laterality: N/A;   [place]; 2007   "1 + 1"   ESOPHAGOGASTRODUODENOSCOPY (EGD) WITH PROPOFOL N/A 03/13/2019   Procedure: ESOPHAGOGASTRODUODENOSCOPY (EGD) WITH PROPOFOL;  Surgeon: Jerene Bears, MD;  Location: [facility];  Service: Gastroenterology;  Laterality: N/A;   ESOPHAGOGASTRODUODENOSCOPY (EGD) WITH PROPOFOL N/A 05/27/2020   Procedure: ESOPHAGOGASTRODUODENOSCOPY (EGD) WITH PROPOFOL;  Surgeon: [name], DO;  [facility];  Service: Endoscopy;  Laterality: N/A;   EYE SURGERY Bilateral    lazer   HEMOSTASIS CLIP PLACEMENT  03/13/2019   Procedure: HEMOSTASIS CLIP PLACEMENT;  Surgeon: Jerene Bears, MD;  Location[facility] ENDOSCOPY;  Service: Gastroenterology;;   KNEE ARTHROSCOPY Left 10/25/2006   POLYPECTOMY  03/13/2019   Procedure: POLYPECTOMY;  Surgeon: Jerene Bears, MD;  Location: [place];  Service: Gastroenterology;;   RIGHT HEART CATH N/A 07/24/2017   Procedure: RIGHT HEART CATH;  Surgeon: Jolaine Artist, MD;  Location: [facility];  Service: Cardiovascular;  Laterality: N/A;   RIGHT HEART CATHETERIZATION N/A 09/22/2013   Procedure: RIGHT HEART CATH;  Surgeon: Jolaine Artist, MD;  Location: [facility] C[facility] LAB;  Service: Cardiovascular;  Laterality: N/A;   SHOULDER ARTHROSCOPY WITH OPEN ROTATOR CUFF REPAIR Right 03/14/2014   Procedure: RIGHT SHOULDER ARTHROSCOPY WITH BICEPS RELEASE, OPEN SUBSCAPULA REPAIR, OPEN SUPRASPINATUS REPAIR.;  Surgeon: Meredith Pel, MD;  Location: [place];  Service: Orthopedics;  Laterality: Right;   TOE  AMPUTATION Right 02/24/2018   GREAT TOE AND 2ND TOE AMPUTATION  TUBAL LIGATION  1970's   Social History   Socioeconomic History   Marital status: Married    Spouse name: Not on file   Number of children: 3   Years of education: 12th   Highest education level: Not on file  Occupational History    Employer: UNEMPLOYED  Tobacco Use   Smoking status: Former    Packs/day: 3.00    Years: 32.00    Pack years: 96.00    Types: Cigarettes    Quit date: 10/24/1997    Years since quitting: 23.5   Smokeless tobacco: Never  Vaping Use   Vaping Use: Never used  Substance and Sexual Activity   Alcohol use: Not Currently    Comment: "might have 2-3 daiquiris in the summer"   Drug use: No   Sexual activity: Not Currently  Other Topics Concern   Not on file  Social History Narrative   Pt lives at home with her spouse.   Caffeine Use- 3 sodas daily   Social Determinants of Health   Financial Resource Strain: Low Risk    Difficulty of Paying Living Expenses: Not very hard  Food Insecurity: No Food Insecurity   Worried About Charity fundraiser in the Last Year: Never true   Ran Out of Food in the Last Year: Never true  Transportation Needs: No Transportation Needs   Lack of Transportation (Medical): No   Lack of Transportation (Non-Medical): No  Physical Activity: Not on file  Stress: Not on file  Social Connections: Not on file  Intimate Partner Violence: Not on file   Lipid Panel     Component Value Date/Time   CHOL 172 03/06/2021 0500   TRIG 150 (H) 03/06/2021 0500   HDL 51 03/06/2021 0500   CHOLHDL 3.4 03/06/2021 0500   VLDL 30 03/06/2021 0500   LDLCALC 91 03/06/2021 0500   LDLCALC 65 01/17/2019 1608   Current Outpatient Medications on File Prior to Encounter  Medication Sig Dispense Refill   acetaminophen (TYLENOL) 500 MG tablet Take 1,000 mg by mouth every 6 (six) hours as needed for moderate pain or headache.     albuterol (VENTOLIN HFA) 108 (90 Base) MCG/ACT inhaler  TAKE 2 PUFFS BY MOUTH EVERY 6 HOURS AS NEEDED FOR WHEEZE OR SHORTNESS OF BREATH 8.5 g 1   allopurinol (ZYLOPRIM) 100 MG tablet Take 0.5 tablets (50 mg total) by mouth daily. 30 tablet 0   Ascorbic Acid (VITAMIN C) 1000 MG tablet Take 1,000 mg by mouth daily.     aspirin EC 81 MG tablet Take 1 tablet (81 mg total) by mouth daily with breakfast. 30 tablet 11   atorvastatin (LIPITOR) 80 MG tablet Take 1 tablet (80 mg total) by mouth daily. 30 tablet 5   carvedilol (COREG) 12.5 MG tablet TAKE 1 TABLET (12.5 MG TOTAL) BY MOUTH 2 (TWO) TIMES DAILY WITH A MEAL. 180 tablet 1   cholecalciferol (VITAMIN D) 1000 units tablet Take 1,000 Units by mouth daily with supper.      clopidogrel (PLAVIX) 75 MG tablet TAKE 1 TABLET BY MOUTH DAILY WITH BREAKFAST. 90 tablet 3   Cranberry 250 MG TABS Take 500 mg by mouth daily.     ferrous sulfate 325 (65 FE) MG tablet Take 1 tablet (325 mg total) by mouth daily with breakfast. Reported on 08/21/2015 60 tablet 2   guaiFENesin-dextromethorphan (ROBITUSSIN DM) 100-10 MG/5ML syrup Take 10 mLs by mouth every 4 (four) hours as needed for cough. 118 mL 0   hydrALAZINE (  APRESOLINE) 10 MG tablet 1 tablet with food     hydrocortisone cream 0.5 % Apply 1 application topically 2 (two) times daily. 30 g 0   Insulin Disposable Pump (OMNIPOD DASH 5 PACK PODS) MISC Inject into the skin See admin instructions. Use continuously with Novolin R - change every 72 hours.     insulin NPH Human (HUMULIN N,NOVOLIN N) 100 UNIT/ML injection Inject 15 Units into the skin See admin instructions. Inject 15 units subcutaneously in the morning if CBG >300;     insulin regular (NOVOLIN R) 100 units/mL injection Inject into the skin See admin instructions. Manually add bolus to continuous dose via OmniPod 3 times daily per sliding scale (CBG 80-150 7 units, 151-200 9 units, 201-250 12 units, 251-300 14 units, 301- 400 17 units)     isosorbide mononitrate (IMDUR) 30 MG 24 hr tablet Take 0.5 tablets (15 mg  total) by mouth daily. 30 tablet 0   KLOR-CON M20 20 MEQ tablet TAKE 1 TABLET (20 MEQ TOTAL) BY MOUTH ONCE A WEEK. TAKE 20 MEQ WITH DOSE OF METOLAZONE 2.5 MG 30 tablet 1   levothyroxine (SYNTHROID, LEVOTHROID) 50 MCG tablet Take 1 tablet (50 mcg total) by mouth daily before breakfast. 90 tablet 1   linagliptin (TRADJENTA) 5 MG TABS tablet 1 tablet     magnesium oxide (MAG-OX) 400 (240 Mg) MG tablet Take 1 tablet by mouth daily.     metolazone (ZAROXOLYN) 2.5 MG tablet Take 1 tablet (2.5 mg total) by mouth daily. 90 tablet 3   Multiple Vitamins-Minerals (AIRBORNE GUMMIES PO) Take 2 tablets by mouth every evening.     Multiple Vitamins-Minerals (AIRBORNE GUMMIES PO) Take 2 tablets by mouth daily.     Multiple Vitamins-Minerals (CENTRUM SILVER 50+WOMEN) TABS Take 1 tablet by mouth every evening.     nitroGLYCERIN (NITROSTAT) 0.4 MG SL tablet PLACE 1 TABLET (0.4 MG TOTAL) UNDER THE TONGUE EVERY 5 (FIVE) MINUTES AS NEEDED FOR CHEST PAIN. 25 tablet 1   ONETOUCH VERIO test strip 3 (three) times daily.     polyethylene glycol (MIRALAX / GLYCOLAX) 17 g packet 1 packet mixed with 8 ounces of fluid     pregabalin (LYRICA) 150 MG capsule Take 150 mg by mouth 2 (two) times daily.     sitaGLIPtin (JANUVIA) 25 MG tablet Take 1 tablet (25 mg total) by mouth daily. 90 tablet 3   torsemide (DEMADEX) 20 MG tablet Take 80 mg by mouth 2 (two) times daily.     ursodiol (ACTIGALL) 500 MG tablet Take 500 mg by mouth 3 (three) times daily.     vitamin B-12 (CYANOCOBALAMIN) 1000 MCG tablet Take 1,000 mcg by mouth daily.     vortioxetine HBr (TRINTELLIX) 10 MG TABS tablet Take 1 tablet (10 mg total) by mouth daily. 30 tablet 5   zinc gluconate 50 MG tablet Take 50 mg by mouth daily.     No current facility-administered medications on file prior to encounter.   Wt Readings from Last 3 Encounters:  05/06/21 120.6 kg (265 lb 12.8 oz)  04/04/21 123.8 kg (273 lb)  04/02/21 124.7 kg (275 lb)   BP 108/70   Pulse 75   Wt  120.6 kg (265 lb 12.8 oz)   SpO2 97%   BMI 44.23 kg/m   PHYSICAL EXAM: General: Obese woman. No resp difficulty HEENT: normal Neck: supple. no JVD. Carotids 2+ bilat; no bruits. No lymphadenopathy or thryomegaly appreciated. Cor: PMI nondisplaced. Regular rate & rhythm. No rubs, gallops or murmurs.  Lungs: clear Abdomen: obese soft, nontender, nondistended. No hepatosplenomegaly. No bruits or masses. Good bowel sounds. Extremities: no cyanosis, clubbing, rash, 1+ edema Neuro: alert & orientedx3, cranial nerves grossly intact. moves all 4 extremities w/o difficulty. Affect pleasant   ASSESSMENT AND PLAN:  CAD  - Known CAD w/ h/o remote MI in 2000 s/p BMS to RCA and again in 2007 w/ BMS to RCA  - Admitted 10/22 NSTEM (hstrop 247) Echo with decline in EF to 30-35% and new inferior WMA. Suspect progression in RCA disease. No cath due CKD IV - Rare angina (stable)  - Continue long-acting nitrate (refill Imdur for her) - Not candidate for cath with CKD IV unless ACS or persistent, severe angina - Continue ASA + Plavix.  - Continue beta blocker. - Continue atorvastatin 80 mg daily.   2. Chronic Diastolic CHF--> Now with reduced EF 30-35%.  - Echo 9/21 EF 55-60%, RV normal  - Echo (10/22): EF down to 30-35% in setting of NSTEMO, Grade II DD.  - Stable NYHA 3B confounded by morbid obesity and physical deconditioning.  - Volume status ok ReDS 28% - Continue compression hose  - Continue torsemide 80 mg bid + weekly metolazone - Continue Imdur 15 mg daily. Will not refill hydral 10 tid due to low BP and need to maintain renal perfusion - Continue carvedilol 12.5 mg bid. - No dig, SGLT2i, ARNI with CKD IV - Again reinforced need for dietary discretion  - Limited options, discussed she will likely need HD for volume removal in the future.    3. CKD IV - Baseline sCr ~3.5. - Followed by outpatient nephrology, Dr Hollie Salk  - Check labs   4. Type 2DM, uncontrolled  - poorly controlled, last  hgb A1c 12.2 in 10/22 - No SGLT2i.  5. HTN - BP on low side. Plan as above   Glori Bickers, MD  2:29 PM

## 2021-05-06 ENCOUNTER — Telehealth: Payer: Self-pay | Admitting: Pharmacist

## 2021-05-06 ENCOUNTER — Ambulatory Visit (HOSPITAL_COMMUNITY)
Admission: RE | Admit: 2021-05-06 | Discharge: 2021-05-06 | Disposition: A | Discharge status: Home / Self Care | Payer: HMO | Source: Ambulatory Visit | Attending: Internal Medicine | Admitting: Internal Medicine

## 2021-05-06 ENCOUNTER — Other Ambulatory Visit: Payer: Self-pay

## 2021-05-06 ENCOUNTER — Telehealth: Payer: HMO

## 2021-05-06 VITALS — BP 108/70 | HR 75 | Wt 265.8 lb

## 2021-05-06 DIAGNOSIS — E1142 Type 2 diabetes mellitus with diabetic polyneuropathy: Secondary | ICD-10-CM | POA: Diagnosis not present

## 2021-05-06 DIAGNOSIS — Z09 Encounter for follow-up examination after completed treatment for conditions other than malignant neoplasm: Secondary | ICD-10-CM | POA: Insufficient documentation

## 2021-05-06 DIAGNOSIS — Z6841 Body Mass Index (BMI) 40.0 and over, adult: Secondary | ICD-10-CM | POA: Diagnosis not present

## 2021-05-06 DIAGNOSIS — I2510 Atherosclerotic heart disease of native coronary artery without angina pectoris: Secondary | ICD-10-CM | POA: Diagnosis not present

## 2021-05-06 DIAGNOSIS — Z79899 Other long term (current) drug therapy: Secondary | ICD-10-CM | POA: Diagnosis not present

## 2021-05-06 DIAGNOSIS — I130 Hypertensive heart and chronic kidney disease with heart failure and stage 1 through stage 4 chronic kidney disease, or unspecified chronic kidney disease: Secondary | ICD-10-CM | POA: Insufficient documentation

## 2021-05-06 DIAGNOSIS — R252 Cramp and spasm: Secondary | ICD-10-CM

## 2021-05-06 DIAGNOSIS — Z87891 Personal history of nicotine dependence: Secondary | ICD-10-CM | POA: Insufficient documentation

## 2021-05-06 DIAGNOSIS — Z7902 Long term (current) use of antithrombotics/antiplatelets: Secondary | ICD-10-CM | POA: Insufficient documentation

## 2021-05-06 DIAGNOSIS — I10 Essential (primary) hypertension: Secondary | ICD-10-CM

## 2021-05-06 DIAGNOSIS — I252 Old myocardial infarction: Secondary | ICD-10-CM | POA: Diagnosis not present

## 2021-05-06 DIAGNOSIS — I5032 Chronic diastolic (congestive) heart failure: Secondary | ICD-10-CM

## 2021-05-06 DIAGNOSIS — E1122 Type 2 diabetes mellitus with diabetic chronic kidney disease: Secondary | ICD-10-CM | POA: Diagnosis not present

## 2021-05-06 DIAGNOSIS — I25119 Atherosclerotic heart disease of native coronary artery with unspecified angina pectoris: Secondary | ICD-10-CM | POA: Diagnosis not present

## 2021-05-06 DIAGNOSIS — R0602 Shortness of breath: Secondary | ICD-10-CM | POA: Diagnosis not present

## 2021-05-06 DIAGNOSIS — Z7982 Long term (current) use of aspirin: Secondary | ICD-10-CM | POA: Diagnosis not present

## 2021-05-06 DIAGNOSIS — Z560 Unemployment, unspecified: Secondary | ICD-10-CM | POA: Diagnosis not present

## 2021-05-06 DIAGNOSIS — N184 Chronic kidney disease, stage 4 (severe): Secondary | ICD-10-CM | POA: Diagnosis not present

## 2021-05-06 DIAGNOSIS — Z794 Long term (current) use of insulin: Secondary | ICD-10-CM | POA: Insufficient documentation

## 2021-05-06 LAB — BASIC METABOLIC PANEL
Anion gap: 17 — ABNORMAL HIGH (ref 5–15)
BUN: 103 mg/dL — ABNORMAL HIGH (ref 8–23)
CO2: 26 mmol/L (ref 22–32)
Calcium: 8.4 mg/dL — ABNORMAL LOW (ref 8.9–10.3)
Chloride: 91 mmol/L — ABNORMAL LOW (ref 98–111)
Creatinine, Ser: 4.32 mg/dL — ABNORMAL HIGH (ref 0.44–1.00)
GFR, Estimated: 10 mL/min — ABNORMAL LOW (ref >60)
Glucose, Bld: 419 mg/dL — ABNORMAL HIGH (ref 70–99)
Potassium: 3.4 mmol/L — ABNORMAL LOW (ref 3.5–5.1)
Sodium: 134 mmol/L — ABNORMAL LOW (ref 135–145)

## 2021-05-06 LAB — BRAIN NATRIURETIC PEPTIDE: B Natriuretic Peptide: 864.9 pg/mL — ABNORMAL HIGH (ref 0.0–100.0)

## 2021-05-06 MED ORDER — ISOSORBIDE MONONITRATE ER 30 MG PO TB24: 15.0000 mg | ORAL_TABLET | Freq: Every day | ORAL | 6 refills | Status: DC

## 2021-05-06 NOTE — Progress Notes (Signed)
ReDS Vest / Clip - 05/06/21 1400       ReDS Vest / Clip   Station Marker C    Ruler Value 39    ReDS Value Range Low volume    ReDS Actual Value 28

## 2021-05-06 NOTE — Patient Instructions (Signed)
Medication Changes:  STOP your hydralazine  Lab Work:  Labs done today, your results will be available in MyChart, we will contact you for abnormal readings.   Testing/Procedures:  Your physician has requested that you have an echocardiogram. Echocardiography is a painless test that uses sound waves to create images of your heart. It provides your doctor with information about the size and shape of your heart and how well your heart's chambers and valves are working. This procedure takes approximately one hour. There are no restrictions for this procedure. 6 months. We will call you with appointment   Referrals:    Special Instructions // Education:    Follow-Up in: 6 months   At the [facility], you and your health needs are our priority. We have a designated team specialized in the treatment of Heart Failure. This Care Team includes your primary Heart Failure Specialized Cardiologist (physician), Advanced Practice Providers (APPs- Physician Assistants and Nurse Practitioners), and Pharmacist who all work together to provide you with the care you need, when you need it.   You may see any of the following providers on your designated Care Team at your next follow up:  Dr Glori Bickers Dr Haynes Kerns, NP Lyda Jester, [facility] [place], [place] Audry Riles, PharmD   Please be sure to bring in all your medications bottles to every appointment.   Need to Contact us:  If you have any questions or concerns before your next appointment please send [name] a message through [place] or call our office at [phone].    TO LEAVE A MESSAGE FOR THE NURSE SELECT OPTION 2, PLEASE LEAVE A MESSAGE INCLUDING: YOUR NAME DATE OF BIRTH CALL BACK NUMBER REASON FOR CALL**this is important as we prioritize the call backs  YOU WILL RECEIVE A CALL BACK THE SAME DAY AS LONG AS YOU CALL BEFORE 4:00 PM

## 2021-05-06 NOTE — Progress Notes (Addendum)
Chronic Care Management Pharmacy Assistant   Name: Susan [name]  MRN: [id number] DOB: 1951-01-11  Susan [name] is an 70 y.o. year old female who presents for her initial CCM visit with the clinical pharmacist.  Reason for Encounter: Chart Prep for Initial Visit with CPP on 05/09/21 @ 9:00am    Recent office visits:   04/04/21 [name], MD (PCP) - Family Medicine - Stage 4 CKD -Labs were ordered. vortioxetine HBr (TRINTELLIX) 10 MG TABS tablet take 1 tablet (10 mg total) by mouth daily prescribed. Follow up as scheduled.  03/15/21 [name], MD (PCP) - Family Medicine - Stage 4 CKD - Labs were ordered. Referral for Home health placed. Pregabalin (LYRICA) 50 MG capsule take 1 capsule (50 mg total) by mouth 2 (two) times daily ordered.  Discontinue Prozac and switch to Trintellix 10 mg a day and recheck in 3 weeks.  03/05/21 [name], MD (PCP) - Family Medicine - Dyspnea - Labs were ordered. EKG performed in office. shortness of breath is related to cardiac ischemia.  Patient refuses to go to the hospital [name].  After much discussion, I was able to convince the patient to go to the hospital but she adamantly refuses to allow me to call an ambulance.  I do not feel that the patient is in a life-threatening emergency at this moment.  Therefore I will compromise with the patient and allow her to drive herself to the local emergency room for further evaluation.   12/17/20 [name], MD (PCP) - Family Medicine - Blood blister - Recommended continue dressing changes for the remainder of this week and then resume normal behavior.  She can reduce her torsemide back to 5 tablets in the morning and 4 tablets in the evening as the swelling is back to her baseline. Follow up as scheduled.   12/13/20 [name], MD (PCP) - Family Medicine - Blood blister  - aspirated the contents of the blood blister with a 18-gauge needle and a 20 cc syringe.  I was able to  completely empty the blood blister.  I then coated the wound with Silvadene, nonadherent gauze, and then wrapped the foot with Coban.  Recommended daily dressing changes.  Recheck next week.  Increase torsemide 100 mg twice daily until seen on Monday due to increased weight and edema  11/15/20 [name], MD (PCP) - Family Medicine - Chronic Diastolic CHF - Labs were ordered. cephALEXin (KEFLEX) 250 MG capsule take 1 capsule (250 mg total) by mouth daily prescribed. anesthetized the seborrheic keratosis on the left side of her neck and removed it using sterile technique with a razor blade. Continue the patient on torsemide 100 mg in the morning and 80 mg in the evening  We discussed the options and the patient would like to try low-dose Keflex 250 mg daily despite that it may have caused diarrhea in the past for prevention of other urinary tract infections to see if this helps.  If she develops diarrhea she will discontinue it immediately.   Recent consult visits:   04/09/21 Meridee Score, MD - Orthopedic surgery - Onychomycosis - No medication changes. Follow up in 3 months.   01/07/21 Meridee Score, MD - Orthopedic surgery- Idiopathic chronic venous hypertension of both lower extremities - Recommended patient wear the extra-large compression stocking she is currently wearing the double extra-large.  The socks were applied for her.  For her legs recommended exercise elevation and compression.Follow up in 3 months.   11/12/20  Meridee Score, MD - Orthopedic surgery- Idiopathic chronic venous hypertension of both lower extremities - No medication changes. Follow up in 3 months.    Hospital visits: 02/05/2021  Medication Reconciliation was completed by comparing discharge summary, patient's EMR and Pharmacy list, and upon discussion with patient.  Admitted to the hospital on 02/05/21 due to Diabetic ketoacidosis. Discharge date was 02/09/21. Discharged from [place]?Medications Started  at [facility] Discharge:?? Azithromycin (ZITHROMAX) 500 MG tablet and loperamide (IMODIUM) 2 MG capsule prescribed.  Medication Changes at Hospital Discharge: Changed Torsemide to 20mg  Take 2 tablets (40 mg total) by mouth 2 (two) times daily.  Medications Discontinued at Hospital Discharge: Metolazone 2.5 and Potassium Chloride 20 meq  Medications that remain the same after Hospital Discharge:??  All other medications will remain the same.    Hospital visits: 03/05/21  Medication Reconciliation was completed by comparing discharge summary, patient's EMR and Pharmacy list, and upon discussion with patient.  Admitted to the hospital on 03/05/21 due to NSTEMI. Discharge date was 03/10/21. Discharged from [place]?Medications Started at [facility] Discharge:?? atorvastatin (LIPITOR) 80 MG tablet, isosorbide mononitrate (IMDUR) 30 MG 24 hr tablet, linagliptin (TRADJENTA) 5 MG TABS tablet, hydrALAZINE (APRESOLINE) 10 MG tablet, fosfomycin (MONUROL) 3 g PACK were prescribed.  Medication Changes at Hospital Discharge: ferrous sulfate 325 (65 FE) MG tablet and magnesium oxide (MAG-OX) 400 MG tablet were changed.  Medications Discontinued at Hospital Discharge: Lyrica, Trazodone, and Keflex were discontinued.   Medications that remain the same after Hospital Discharge:??  All other medications will remain the same.    Medications: Outpatient Encounter Medications as of 05/06/2021  Medication Sig   acetaminophen (TYLENOL) 500 MG tablet Take 1,000 mg by mouth every 6 (six) hours as needed for moderate pain or headache.   albuterol (VENTOLIN HFA) 108 (90 Base) MCG/ACT inhaler TAKE 2 PUFFS BY MOUTH EVERY 6 HOURS AS NEEDED FOR WHEEZE OR SHORTNESS OF BREATH   allopurinol (ZYLOPRIM) 100 MG tablet Take 0.5 tablets (50 mg total) by mouth daily.   Ascorbic Acid (VITAMIN C) 1000 MG tablet Take 1,000 mg by mouth daily.   aspirin EC 81 MG tablet Take 1 tablet (81 mg total) by mouth  daily with breakfast.   atorvastatin (LIPITOR) 80 MG tablet Take 1 tablet (80 mg total) by mouth daily.   carvedilol (COREG) 12.5 MG tablet TAKE 1 TABLET (12.5 MG TOTAL) BY MOUTH 2 (TWO) TIMES DAILY WITH A MEAL.   cholecalciferol (VITAMIN D) 1000 units tablet Take 1,000 Units by mouth daily with supper.    clopidogrel (PLAVIX) 75 MG tablet TAKE 1 TABLET BY MOUTH DAILY WITH BREAKFAST.   Cranberry 250 MG TABS Take 500 mg by mouth daily.   ferrous sulfate 325 (65 FE) MG tablet Take 1 tablet (325 mg total) by mouth daily with breakfast. Reported on 08/21/2015   FLUoxetine (PROZAC) 20 MG capsule TAKE 1 CAPSULE BY MOUTH EVERY DAY (Patient not taking: Reported on 04/08/2021)   fosfomycin (MONUROL) 3 g PACK See admin instructions. (Patient not taking: Reported on 04/08/2021)   guaiFENesin-dextromethorphan (ROBITUSSIN DM) 100-10 MG/5ML syrup Take 10 mLs by mouth every 4 (four) hours as needed for cough.   hydrALAZINE (APRESOLINE) 10 MG tablet 1 tablet with food   hydrocortisone cream 0.5 % Apply 1 application topically 2 (two) times daily.   Insulin Disposable Pump (OMNIPOD DASH 5 PACK PODS) MISC Inject into the skin See admin instructions. Use continuously with Novolin R - change  every 72 hours.   insulin NPH Human (HUMULIN N,NOVOLIN N) 100 UNIT/ML injection Inject 15 Units into the skin See admin instructions. Inject 15 units subcutaneously in the morning if CBG >300;   insulin regular (NOVOLIN R) 100 units/mL injection Inject into the skin See admin instructions. Manually add bolus to continuous dose via OmniPod 3 times daily per sliding scale (CBG 80-150 7 units, 151-200 9 units, 201-250 12 units, 251-300 14 units, 301- 400 17 units)   isosorbide mononitrate (IMDUR) 30 MG 24 hr tablet Take 0.5 tablets (15 mg total) by mouth daily.   KLOR-CON M20 20 MEQ tablet TAKE 1 TABLET (20 MEQ TOTAL) BY MOUTH ONCE A WEEK. TAKE 20 MEQ WITH DOSE OF METOLAZONE 2.5 MG   levothyroxine (SYNTHROID, LEVOTHROID) 50 MCG tablet  Take 1 tablet (50 mcg total) by mouth daily before breakfast.   linagliptin (TRADJENTA) 5 MG TABS tablet 1 tablet   magnesium oxide (MAG-OX) 400 (240 Mg) MG tablet Take 1 tablet by mouth daily.   metolazone (ZAROXOLYN) 2.5 MG tablet Take 1 tablet (2.5 mg total) by mouth daily.   Multiple Vitamins-Minerals (AIRBORNE GUMMIES PO) Take 2 tablets by mouth every evening. (Patient not taking: Reported on 04/08/2021)   Multiple Vitamins-Minerals (AIRBORNE GUMMIES PO) Take 2 tablets by mouth daily.   Multiple Vitamins-Minerals (CENTRUM SILVER 50+WOMEN) TABS Take 1 tablet by mouth every evening.   nitroGLYCERIN (NITROSTAT) 0.4 MG SL tablet PLACE 1 TABLET (0.4 MG TOTAL) UNDER THE TONGUE EVERY 5 (FIVE) MINUTES AS NEEDED FOR CHEST PAIN.   ONETOUCH VERIO test strip 3 (three) times daily.   polyethylene glycol (MIRALAX / GLYCOLAX) 17 g packet 1 packet mixed with 8 ounces of fluid   pregabalin (LYRICA) 150 MG capsule Take 150 mg by mouth 2 (two) times daily.   sitaGLIPtin (JANUVIA) 25 MG tablet Take 1 tablet (25 mg total) by mouth daily.   torsemide (DEMADEX) 20 MG tablet Take 80 mg by mouth 2 (two) times daily.   ursodiol (ACTIGALL) 500 MG tablet Take 500 mg by mouth 3 (three) times daily.   vitamin B-12 (CYANOCOBALAMIN) 1000 MCG tablet Take 1,000 mcg by mouth daily.   vortioxetine HBr (TRINTELLIX) 10 MG TABS tablet Take 1 tablet (10 mg total) by mouth daily.   zinc gluconate 50 MG tablet Take 50 mg by mouth daily.   No facility-administered encounter medications on file as of 05/06/2021.     Have you seen any other providers since your last visit? no  Any changes in your medications or health? no  Any side effects from any medications? no  Do you have an symptoms or problems not managed by your medications? no  Any concerns about your health right now? no  Has your provider asked that you check blood pressure, blood sugar, or follow special diet at home? Yes, Patient reports she checks her blood  sugars at home and occasionally checks blood pressure.   Do you get any type of exercise on a regular basis? yes  Can you think of a goal you would like to reach for your health? Patient reported she would like to work on her [facility] staying down and weight loss.   Do you have any problems getting your medications? Yes, she reports she does have issues with getting medications due to cost.   Is there anything that you would like to discuss during the appointment?   Please bring medications and supplements to appointment  Patient confirmed appointment date and time.   Care Gaps  AWV: overdue  Colonoscopy: due 03/12/29 DM Eye Exam:  overdue 09/24/18 DM Foot Exam: never  Microalbumin: unknown  HbgAIC: done 08/26/20 (10.3) overdue  DEXA: unknown  Mammogram: done 11/22/15   Star Rating Drugs: sitaGLIPtin (JANUVIA) 25 MG tablet - last filled 03/13/21 90 days  atorvastatin (LIPITOR) 80 MG tablet - last filled 04/02/21 90 days   Future Appointments  Date Time Provider [place]  05/06/2021  2:00 PM Bensimhon, Shaune Pascal, MD [facility] None  05/09/2021  9:00 AM [facility] PHARMACIST [facility] None  05/17/2021 10:45 AM [facility] CARE [facility] None  07/11/2021  2:00 PM [name], [name], MD [facility] None    Jobe Gibbon, [facility] Clinical Pharmacist Assistant  [phone]

## 2021-05-07 NOTE — Progress Notes (Deleted)
Chronic Care Management Pharmacy Note  05/07/2021 Name:  Susan [name] MRN:  [id number] DOB:  Mar 05, 1951  Summary: ***  Recommendations/Changes made from today's visit: ***  Plan: ***   Subjective: Susan [name] is an 70 y.o. year old female who is a primary patient of Susan [name], Susan Mcgee, MD.  The CCM team was consulted for assistance with disease management and care coordination needs.    Engaged with patient by telephone for initial visit in response to provider referral for pharmacy case management and/or care coordination services.   Consent to Services:  The patient was given the following information about Chronic Care Management services today, agreed to services, and gave verbal consent: 1. CCM service includes personalized support from designated clinical staff supervised by the primary care provider, including individualized plan of care and coordination with other care providers 2. 24/7 contact phone numbers for assistance for urgent and routine care needs. 3. Service will only be billed when office clinical staff spend 20 minutes or more in a month to coordinate care. 4. Only one practitioner may furnish and bill the service in a calendar month. 5.The patient may stop CCM services at any time (effective at the end of the month) by phone call to the office staff. 6. The patient will be responsible for cost sharing (co-pay) of up to 20% of the service fee (after annual deductible is met). Patient agreed to services and consent obtained.  Patient Care Team: Susan Frizzle, MD as PCP - General (Family Medicine) Bensimhon, Shaune Pascal, MD as PCP - Cardiology (Cardiology) Jacelyn Pi, MD (Endocrinology) Hollie Salk Nevin Bloodgood, PA-C (Neurology) Newt Minion, MD as Consulting Physician (Orthopedic Surgery) Jorge Ny, LCSW as Social Worker (Licensed Clinical Social Worker) Kassie Mends, RN as [place], [place], [facility] as Pharmacist  (Pharmacist)  Recent office visits:    04/04/21 Susan Luo, MD (PCP) - Family Medicine - Stage 4 CKD -Labs were ordered. vortioxetine HBr (TRINTELLIX) 10 MG TABS tablet take 1 tablet (10 mg total) by mouth daily prescribed. Follow up as scheduled.   03/15/21 Susan Luo, MD (PCP) - Family Medicine - Stage 4 CKD - Labs were ordered. Referral for Home health placed. Pregabalin (LYRICA) 50 MG capsule take 1 capsule (50 mg total) by mouth 2 (two) times daily ordered.  Discontinue Prozac and switch to Trintellix 10 mg a day and recheck in 3 weeks.   03/05/21 Susan Luo, MD (PCP) - Family Medicine - Dyspnea - Labs were ordered. EKG performed in office. shortness of breath is related to cardiac ischemia.  Patient refuses to go to the hospital [place].  After much discussion, I was able to convince the patient to go to the hospital but she adamantly refuses to allow me to call an ambulance.  I do not feel that the patient is in a life-threatening emergency at this moment.  Therefore I will compromise with the patient and allow her to drive herself to the local emergency room for further evaluation.    12/17/20 Susan Luo, MD (PCP) - Family Medicine - Blood blister - Recommended continue dressing changes for the remainder of this week and then resume normal behavior.  She can reduce her torsemide back to 5 tablets in the morning and 4 tablets in the evening as the swelling is back to her baseline. Follow up as scheduled.    12/13/20 Susan Luo, MD (PCP) - Family Medicine - Blood blister  - aspirated the contents  of the blood blister with a 18-gauge needle and a 20 cc syringe.  I was able to completely empty the blood blister.  I then coated the wound with Silvadene, nonadherent gauze, and then wrapped the foot with Coban.  Recommended daily dressing changes.  Recheck next week.  Increase torsemide 100 mg twice daily until seen on Monday due to increased weight and edema   11/15/20  Susan Luo, MD (PCP) - Family Medicine - Chronic Diastolic CHF - Labs were ordered. cephALEXin (KEFLEX) 250 MG capsule take 1 capsule (250 mg total) by mouth daily prescribed. anesthetized the seborrheic keratosis on the left side of her neck and removed it using sterile technique with a razor blade. Continue the patient on torsemide 100 mg in the morning and 80 mg in the evening  We discussed the options and the patient would like to try low-dose Keflex 250 mg daily despite that it may have caused diarrhea in the past for prevention of other urinary tract infections to see if this helps.  If she develops diarrhea she will discontinue it immediately.     Recent consult visits:    04/09/21 Meridee Score, MD - Orthopedic surgery - Onychomycosis - No medication changes. Follow up in 3 months.    01/07/21 Meridee Score, MD - Orthopedic surgery- Idiopathic chronic venous hypertension of both lower extremities - Recommended patient wear the extra-large compression stocking she is currently wearing the double extra-large.  The socks were applied for her.  For her legs recommended exercise elevation and compression.Follow up in 3 months.    11/12/20  Meridee Score, MD - Orthopedic surgery- Idiopathic chronic venous hypertension of both lower extremities - No medication changes. Follow up in 3 months.      Hospital visits: 02/05/2021   Medication Reconciliation was completed by comparing discharge summary, patients EMR and Pharmacy list, and upon discussion with patient.   Admitted to the hospital on 02/05/21 due to Diabetic ketoacidosis. Discharge date was 02/09/21. Discharged from [place]?Medications Started at [facility] Discharge:?? Azithromycin (ZITHROMAX) 500 MG tablet and loperamide (IMODIUM) 2 MG capsule prescribed.   Medication Changes at Hospital Discharge: Changed Torsemide to 78m Take 2 tablets (40 mg total) by mouth 2 (two) times daily.   Medications Discontinued at Hospital  Discharge: Metolazone 2.5 and Potassium Chloride 20 meq   Medications that remain the same after Hospital Discharge:??  All other medications will remain the same.     Hospital visits: 03/05/21   Medication Reconciliation was completed by comparing discharge summary, patients EMR and Pharmacy list, and upon discussion with patient.   Admitted to the hospital on 03/05/21 due to NSTEMI. Discharge date was 03/10/21. [facility] atorvastatin (LIPITOR) 80 MG tablet, isosorbide mononitrate (IMDUR) 30 MG 24 hr tablet, linagliptin (TRADJENTA) 5 MG TABS tablet, hydrALAZINE (APRESOLINE) 10 MG tablet, fosfomycin (MONUROL) 3 g PACK were prescribed.   Medication Changes at Hospital Discharge: ferrous sulfate 325 (65 FE) MG tablet and magnesium oxide (MAG-OX) 400 MG tablet were changed.   Medications Discontinued at Hospital Discharge: Lyrica, Trazodone, and Keflex were discontinued.    Medications that remain the same after Hospital Discharge:??  All other medications will remain the same.       Objective:  Lab Results  Component Value Date   CREATININE 4.32 (H) 05/06/2021   BUN 103 (H) 05/06/2021   GFR 41.58 (L) 08/17/2013   GFRNONAA 10 (L)  05/06/2021   GFRAA 13 (L) 11/15/2020   NA 134 (L) 05/06/2021   K 3.4 (L) 05/06/2021   CALCIUM 8.4 (L) 05/06/2021   CO2 26 05/06/2021   GLUCOSE 419 (H) 05/06/2021    Lab Results  Component Value Date/Time   HGBA1C 12.2 (H) 03/05/2021 05:34 PM   HGBA1C 10.3 (H) 08/26/2020 08:01 AM   GFR 41.58 (L) 08/17/2013 02:46 PM   GFR 61.93 01/17/2011 02:36 PM   MICROALBUR 287.8 06/29/2020 01:58 PM   MICROALBUR 65.6 11/14/2016 09:25 AM    Last diabetic Eye exam:  Lab Results  Component Value Date/Time   HMDIABEYEEXA Retinopathy (A) 09/23/2017 12:00 AM    Last diabetic Foot exam: No results found for: HMDIABFOOTEX   Lab Results  Component Value Date   CHOL 172 03/06/2021    HDL 51 03/06/2021   LDLCALC 91 03/06/2021   TRIG 150 (H) 03/06/2021   CHOLHDL 3.4 03/06/2021    Hepatic Function Latest Ref Rng & Units 03/15/2021 08/22/2020 08/21/2020  Total Protein 6.1 - 8.1 g/dL 6.3 5.7(L) 6.3(L)  Albumin 3.5 - 5.0 g/dL - 2.8(L) 3.2(L)  AST 10 - 35 U/L 23 39 23  ALT 6 - 29 U/L _0 Alk Phosphatase 38 - 126 U/L - 189(H) 207(H)  Total Bilirubin 0.2 - 1.2 mg/dL 0.4 1.2 0.5  Bilirubin, Direct 0.0 - 0.2 mg/dL - - -    Lab Results  Component Value Date/Time   TSH 3.69 07/05/2020 12:47 PM   TSH 3.591 05/17/2020 04:59 PM   TSH 0.964 02/14/2018 11:18 AM   TSH 1.374 12/25/2012 06:00 PM    CBC Latest Ref Rng & Units 03/15/2021 03/09/2021 03/07/2021  WBC 3.8 - 10.8 Thousand/uL 9.0 6.5 7.5  Hemoglobin 11.7 - 15.5 g/dL 12.2 10.6(L) 11.4(L)  Hematocrit 35.0 - 45.0 % 38.7 34.2(L) 36.1  Platelets 140 - 400 Thousand/uL 182 159 158    No results found for: VD25OH  Clinical ASCVD: {YES/NO:21197} The ASCVD Risk score (Arnett DK, et al., 2019) failed to calculate for the following reasons:   The patient has a prior MI or stroke diagnosis    Depression screen [facility] 2/9 03/15/2021 02/04/2021 02/04/2021  Decreased Interest 3 0 0  Down, Depressed, Hopeless 3 0 0  PHQ - 2 Score 6 0 0  Altered sleeping 3 - -  Tired, decreased energy 3 - -  Change in appetite 3 - -  Feeling bad or failure about yourself  3 - -  Trouble concentrating 3 - -  Moving slowly or fidgety/restless 3 - -  Suicidal thoughts 1 - -  PHQ-9 Score 25 - -  Difficult doing work/chores Extremely dIfficult - -  Some recent data might be hidden     ***Other: (CHADS2VASc if Afib, MMRC or CAT for COPD, ACT, DEXA)  Social History   Tobacco Use  Smoking Status Former   Packs/day: 3.00   Years: 32.00   Pack years: 96.00   Types: Cigarettes   Quit date: 10/24/1997   Years since quitting: 23.5  Smokeless Tobacco Never   BP Readings from Last 3 Encounters:  05/06/21 108/70  04/04/21 128/68  04/02/21  118/70   Pulse Readings from Last 3 Encounters:  05/06/21 75  04/04/21 80  04/02/21 82   Wt Readings from Last 3 Encounters:  05/06/21 265 lb 12.8 oz (120.6 kg)  04/04/21 273 lb (123.8 kg)  04/02/21 275 lb (124.7 kg)   BMI Readings from Last 3 Encounters:  05/06/21 44.23 kg/m  04/04/21 45.43 kg/m  04/02/21 45.76 kg/m    Assessment/Interventions: Review of patient past medical history, allergies, medications, health status, including review of consultants reports, laboratory and other test data, was performed as part of comprehensive evaluation and provision of chronic care management services.   SDOH:  (Social Determinants of Health) assessments and interventions performed: {yes/no:20286}  SDOH Screenings   Alcohol Screen: Low Risk    Last Alcohol Screening Score (AUDIT): 0  Depression (PHQ2-9): Medium Risk   PHQ-2 Score: 25  Financial Resource Strain: Low Risk    Difficulty of Paying Living Expenses: Not very hard  Food Insecurity: No Food Insecurity   Worried About Charity fundraiser in the Last Year: Never true   Ran Out of Food in the Last Year: Never true  Housing: Low Risk    Last Housing Risk Score: 0  Physical Activity: Not on file  Social Connections: Not on file  Stress: Not on file  Tobacco Use: Medium Risk   Smoking Tobacco Use: Former   Smokeless Tobacco Use: Never   Passive Exposure: Not on file  Transportation Needs: No Transportation Needs   Lack of Transportation (Medical): No   Lack of Transportation (Non-Medical): No    CCM Care Plan  Allergies  Allergen Reactions   Drug Ingredient [Cephalexin] Diarrhea    Pt reports heavy diarrhea with use of keflex   Codeine Nausea And Vomiting and Other (See Comments)    Medications Reviewed Today     Reviewed by Jerl Mina, RN (Registered Nurse) on 05/06/21 at 1415  Med List Status: <None>   Medication Order Taking? Sig Documenting Provider Last Dose Status Informant  acetaminophen  (TYLENOL) 500 MG tablet [id number] Yes Take 1,000 mg by mouth every 6 (six) hours as needed for moderate pain or headache. [provider] Taking Active Self  albuterol (VENTOLIN HFA) 108 (90 Base) MCG/ACT inhaler [id number] Yes TAKE 2 PUFFS BY MOUTH EVERY 6 HOURS AS NEEDED FOR WHEEZE OR SHORTNESS OF BREATH [place], [name], MD Taking Active   allopurinol (ZYLOPRIM) 100 MG tablet [id number] Yes Take 0.5 tablets (50 mg total) by mouth daily. Florencia Reasons, MD Taking Active   Ascorbic Acid (VITAMIN C) 1000 MG tablet [id number] Yes Take 1,000 mg by mouth daily. [provider] Taking Active Self  aspirin EC 81 MG tablet [id number] Yes Take 1 tablet (81 mg total) by mouth daily with breakfast. Roxan Hockey, MD Taking Active Self  atorvastatin (LIPITOR) 80 MG tablet [id number] Yes Take 1 tablet (80 mg total) by mouth daily. [place], [place], FNP Taking Active   carvedilol (COREG) 12.5 MG tablet [id number] Yes TAKE 1 TABLET (12.5 MG TOTAL) BY MOUTH 2 (TWO) TIMES DAILY WITH A MEAL. Susan Frizzle, MD Taking Active   cholecalciferol (VITAMIN D) 1000 units tablet [id number] Yes Take 1,000 Units by mouth daily with supper.  [provider] Taking Active Self  clopidogrel (PLAVIX) 75 MG tablet [id number] Yes TAKE 1 TABLET BY MOUTH DAILY WITH BREAKFAST. Darrick Grinder D, NP Taking Active   Cranberry 250 MG TABS [id number] Yes Take 500 mg by mouth daily. [provider] Taking Active Self  ferrous sulfate 325 (65 FE) MG tablet [id number] Yes Take 1 tablet (325 mg total) by mouth daily with breakfast. Reported on 08/21/2015 Florencia Reasons, MD Taking Active   guaiFENesin-dextromethorphan [facility] DM) 100-10 MG/5ML syrup [id number] Yes Take 10 mLs by mouth every 4 (four) hours as needed for cough. [place] Eric J, DO Taking Active Self  hydrALAZINE (APRESOLINE) 10 MG tablet [id number] Yes 1 tablet with food [provider] Taking Active   hydrocortisone cream 0.5 % [id number] Yes Apply 1  application topically 2 (two) times daily. Florencia Reasons, MD Taking Active   Insulin Disposable Pump (OMNIPOD DASH 5 PACK PODS) MISC [id number] Yes Inject into the skin See admin instructions. Use continuously with Novolin R - change every 72 hours. [provider] Taking Active Self           Med Note Isac Caddy, LENA B   Thu Apr 28, 2019  1:41 PM)    insulin NPH Human (HUMULIN N,NOVOLIN N) 100 UNIT/ML injection [id number] Yes Inject 15 Units into the skin See admin instructions. Inject 15 units subcutaneously in the morning if CBG >300; [provider] Taking Active Self           Med Note Isac Caddy, LENA B   Thu Apr 28, 2019  1:41 PM)    insulin regular (NOVOLIN R) 100 units/mL injection [id number] Yes Inject into the skin See admin instructions. Manually add bolus to continuous dose via OmniPod 3 times daily per sliding scale (CBG 80-150 7 units, 151-200 9 units, 201-250 12 units, 251-300 14 units, 301- 400 17 units) [provider] Taking Active Self           Med Note Dimitri Ped, AMANDA L   Tue Jul 24, 2020  1:31 PM)    isosorbide mononitrate (IMDUR) 30 MG 24 hr tablet [id number] Yes Take 0.5 tablets (15 mg total) by mouth daily. Florencia Reasons, MD Taking Active   KLOR-CON M20 20 MEQ tablet [id number] Yes TAKE 1 TABLET (20 MEQ TOTAL) BY MOUTH ONCE A WEEK. TAKE 20 MEQ WITH DOSE OF METOLAZONE 2.5 MG Bensimhon, Shaune Pascal, MD Taking Active   levothyroxine (SYNTHROID, LEVOTHROID) 50 MCG tablet [id number] Yes Take 1 tablet (50 mcg total) by mouth daily before breakfast. [name], PA-C Taking Active Self  linagliptin (TRADJENTA) 5 MG TABS tablet [id number] Yes 1 tablet [provider] Taking Active   magnesium oxide (MAG-OX) 400 (240 Mg) MG tablet [id number] Yes Take 1 tablet by mouth daily. [provider] Taking Active   metolazone (ZAROXOLYN) 2.5 MG tablet [id number] Yes Take 1 tablet (2.5 mg total) by mouth daily. [place], Maricela Bo, FNP Taking Active   Multiple  Vitamins-Minerals (AIRBORNE GUMMIES PO) [id number] Yes Take 2 tablets by mouth every evening. [provider] Taking Active Self  Multiple Vitamins-Minerals (AIRBORNE GUMMIES PO) [id number] Yes Take 2 tablets by mouth daily. [provider] Taking Active   Multiple Vitamins-Minerals (CENTRUM SILVER 50+WOMEN) TABS [id number] Yes Take 1 tablet by mouth every evening. [provider] Taking Active Self  nitroGLYCERIN (NITROSTAT) 0.4 MG SL tablet [id number] Yes PLACE 1 TABLET (0.4 MG TOTAL) UNDER THE TONGUE EVERY 5 (FIVE) MINUTES AS NEEDED FOR CHEST PAIN. Larey Dresser, MD Taking Active Self  Roma Schanz test strip [id number] Yes 3 (three) times daily. [provider] Taking Active Self  polyethylene glycol (MIRALAX / GLYCOLAX) 17 g packet [id number] Yes 1 packet mixed with 8 ounces of fluid [provider] Taking Active   pregabalin (LYRICA) 150 MG capsule [id number] Yes Take 150 mg by mouth 2 (two) times daily. [provider] Taking Active   sitaGLIPtin (JANUVIA) 25 MG tablet [id number] Yes Take 1 tablet (25 mg total) by mouth daily. Susan Frizzle, MD Taking Active   torsemide (DEMADEX) 20 MG tablet [id number] Yes Take 80 mg by mouth 2 (two) times daily. [provider] Taking Active Self  ursodiol (ACTIGALL) 500 MG tablet [id number] Yes Take 500 mg by mouth 3 (three) times daily. [provider] Taking Active Self  vitamin B-12 (CYANOCOBALAMIN) 1000 MCG tablet [id number] Yes Take 1,000 mcg by mouth daily. [provider] Taking Active Self  vortioxetine HBr (TRINTELLIX) 10 MG TABS tablet [id number] Yes Take 1 tablet (10 mg total) by mouth daily. Susan Frizzle, MD Taking Active   zinc gluconate 50 MG tablet [id number] Yes Take 50 mg by mouth daily. [provider] Taking Active Self            Patient Active Problem List   Diagnosis Date Noted   NSTEMI (non-ST elevated myocardial infarction) ([place])  03/05/2021   Acute renal failure superimposed on stage 4 chronic kidney disease ([place]) 08/22/2020   Hypoalbuminemia 05/25/2020   GERD (gastroesophageal reflux disease) 05/25/2020   Pressure injury of skin 05/17/2020   Type 2 diabetes mellitus with diabetic polyneuropathy, with long-term current use of insulin ([place]) 03/07/2020   Obesity, Class III, BMI 40-49.9 (morbid obesity) ([place]) 03/07/2020   Common bile duct (CBD) obstruction 05/28/2019   Benign neoplasm of ascending colon    Benign neoplasm of transverse colon    Benign neoplasm of descending colon    Benign neoplasm of sigmoid colon    Gastric polyps    Hyperkalemia 03/11/2019   Prolonged QT interval 03/11/2019   Onychomycosis 06/21/2018   Osteomyelitis of second toe of right foot ([place])    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 ([place])    PVD (peripheral vascular disease) ([place]) 10/26/2017   E-coli UTI 07/27/2017   Hypothyroidism 07/27/2017   PAH (pulmonary artery hypertension) ([facility])    Impaired ambulation 07/19/2017   Leg cramps 02/27/2017   Peripheral edema 01/12/2017   Diabetic neuropathy ([place]) 11/12/2016   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 mellitus ([place]) 10/24/2015   Anemia 10/03/2015   Generalized anxiety disorder 10/03/2015   Insomnia 10/03/2015   Hyperglycemia due to diabetes mellitus ([place]) 06/07/2015   Chronic diastolic CHF (congestive heart failure) ([place]) 06/07/2015   Non compliance with medical treatment 04/17/2014   Rotator cuff tear 03/14/2014   Class 3 obesity ([place]) 09/23/2013   Hypotension 12/25/2012   Urinary incontinence    MDD (major depressive disorder) 11/12/2010   RBBB (right bundle branch block)    Coronary artery disease    Hyperlipemia 01/22/2009   Essential hypertension 01/22/2009    Immunization History  Administered Date(s) Administered   DTaP 07/19/2012   Fluad Quad(high Dose 65+) 01/17/2019   Influenza Split 02/23/2013   Influenza, High Dose Seasonal PF 03/03/2016    Influenza, Quadrivalent, Recombinant, Inj, Pf 02/01/2018, 02/21/2020   Influenza,inj,Quad PF,6+ Mos 02/02/2014, 03/26/2015, 02/27/2017   Influenza-Unspecified 02/01/2018, 02/21/2020   Moderna Sars-Covid-2 Vaccination 07/30/2019, 08/27/2019, 06/18/2020   Pneumococcal Conjugate-13 06/02/2017   Pneumococcal Polysaccharide-23 12/26/2012, 07/12/2015   Zoster Recombinat (Shingrix) 06/02/2017, 09/25/2017    Conditions to be addressed/monitored:  CHF, HTN, NSTEMI, DM, Stage 4 CKD, GAD, Insomnia  There are no care plans that you recently modified to display for this patient.    Medication Assistance: {MEDASSISTANCEINFO:25044}  Compliance/Adherence/Medication fill history: Care Gaps: ***  Star-Rating Drugs: ***  Patient's preferred pharmacy is:  [id number] [place] [id number]: [phone]: [phone] Uses pill box? {Yes or If no, why not?:20788} Pt endorses ***% compliance  We discussed: {  Pharmacy options:24294} Patient decided to: {US Pharmacy Plan:23885}  Care Plan and Follow Up Patient Decision:  {FOLLOWUP:24991}  Plan: {CM FOLLOW UP TYVD:73225}  ***   Current Barriers:  {pharmacybarriers:24917}  Pharmacist Clinical Goal(s):  Patient will {PHARMACYGOALCHOICES:24921} through collaboration with PharmD and provider.   Interventions: 1:1 collaboration with Susan Frizzle, MD regarding development and update of comprehensive plan of care as evidenced by provider attestation and co-signature Inter-disciplinary care team collaboration (see longitudinal plan of care) Comprehensive medication review performed; medication list updated in electronic medical record  Hypertension (BP goal {CHL HP UPSTREAM Pharmacist BP ranges:[phone]}) -{US controlled/uncontrolled:25276} -Current treatment: *** -Medications previously tried: ***  -Current home readings:  *** -Current dietary habits: *** -Current exercise habits: *** -{ACTIONS;DENIES/REPORTS:21021675::"Denies"} hypotensive/hypertensive symptoms -Educated on {CCM BP Counseling:25124} -Counseled to monitor BP at home ***, document, and provide log at future appointments -{CCMPHARMDINTERVENTION:25122}  Diabetes (A1c goal {A1c goals:23924}) -{US controlled/uncontrolled:25276} -Current medications: Januvia 81m daily -Medications previously tried: ***  -Current home glucose readings fasting glucose: *** post prandial glucose: *** -{ACTIONS;DENIES/REPORTS:21021675::"Denies"} hypoglycemic/hyperglycemic symptoms -Current meal patterns:  breakfast: ***  lunch: ***  dinner: *** snacks: *** drinks: *** -Current exercise: *** -Educated on {CCM DM COUNSELING:25123} -Counseled to check feet daily and get yearly eye exams -{CCMPHARMDINTERVENTION:25122}  Heart Failure (Goal: manage symptoms and prevent exacerbations) -{US controlled/uncontrolled:25276} -Last ejection fraction: *** (Date: ***) -HF type: {type of heart failure:30421350} -NYHA Class: {CHL HP Upstream Pharm NYHA Class:[phone]} -AHA HF Stage: {CHL HP Upstream Pharm AHA HF Stage:[phone]} -Current treatment: *** -Medications previously tried: ***  -Current home BP/HR readings: *** -Current dietary habits: *** -Current exercise habits: *** -Educated on {CCM HF Counseling:25125} -{CCMPHARMDINTERVENTION:25122}  GAD (Goal: ***) -{US controlled/uncontrolled:25276} -Current treatment  *** -Medications previously tried: ***  -{CCMPHARMDINTERVENTION:25122}  Insomnia (Goal: ***) -{US controlled/uncontrolled:25276} -Current treatment  *** -Medications previously tried: ***  -{CCMPHARMDINTERVENTION:25122}  Patient Goals/Self-Care Activities Patient will:  - {pharmacypatientgoals:24919}  Follow Up Plan: {CM FOLLOW UP POHCS:91980}

## 2021-05-08 DIAGNOSIS — H43813 Vitreous degeneration, bilateral: Secondary | ICD-10-CM | POA: Diagnosis not present

## 2021-05-08 DIAGNOSIS — H43392 Other vitreous opacities, left eye: Secondary | ICD-10-CM | POA: Diagnosis not present

## 2021-05-08 DIAGNOSIS — E113513 Type 2 diabetes mellitus with proliferative diabetic retinopathy with macular edema, bilateral: Secondary | ICD-10-CM | POA: Diagnosis not present

## 2021-05-08 DIAGNOSIS — H3582 Retinal ischemia: Secondary | ICD-10-CM | POA: Diagnosis not present

## 2021-05-09 ENCOUNTER — Telehealth: Payer: HMO

## 2021-05-09 NOTE — Progress Notes (Signed)
Chronic Care Management Pharmacy Note  05/07/2021 Name:  Jaime Dome MRN:  [id number] DOB:  21-Jul-1950  Summary: Initial visit with PharmD.  DM severely uncontrolled.  She reports she is just giving herself bolus through omnipod.  Unclear if there is a basal rate.  She also cannot afford Trintellix.  Recommendations/Changes made from today's visit: Substantial counseling on DM diet - patient to work on decreasing fast food and gatorades, soda  Plan: FU 3 months   Subjective: Susan [name] is an 70 y.o. year old female who is a primary patient of [name], [name], MD.  The CCM team was consulted for assistance with disease management and care coordination needs.    Engaged with patient by telephone for initial visit in response to provider referral for pharmacy case management and/or care coordination services.   Consent to Services:  The patient was given the following information about Chronic Care Management services today, agreed to services, and gave verbal consent: 1. CCM service includes personalized support from designated clinical staff supervised by the primary care provider, including individualized plan of care and coordination with other care providers 2. 24/7 contact phone numbers for assistance for urgent and routine care needs. 3. Service will only be billed when office clinical staff spend 20 minutes or more in a month to coordinate care. 4. Only one practitioner may furnish and bill the service in a calendar month. 5.The patient may stop CCM services at any time (effective at the end of the month) by phone call to the office staff. 6. The patient will be responsible for cost sharing (co-pay) of up to 20% of the service fee (after annual deductible is met). Patient agreed to services and consent obtained.  Patient Care Team: [name], MD as PCP - General (Family Medicine) Bensimhon, Shaune Pascal, MD as PCP - Cardiology (Cardiology) Jacelyn Pi, MD  (Endocrinology) Hollie Salk Nevin Bloodgood, PA-C (Neurology) Newt Minion, MD as Consulting Physician (Orthopedic Surgery) Jorge Ny, LCSW as Social Worker (Licensed Clinical Social Worker) Kassie Mends, RN as [place], [place], [facility] as Pharmacist (Pharmacist)  Recent office visits:    04/04/21 [name], MD (PCP) - Family Medicine - Stage 4 CKD -Labs were ordered. vortioxetine HBr (TRINTELLIX) 10 MG TABS tablet take 1 tablet (10 mg total) by mouth daily prescribed. Follow up as scheduled.   03/15/21 [name], MD (PCP) - Family Medicine - Stage 4 CKD - Labs were ordered. Referral for Home health placed. Pregabalin (LYRICA) 50 MG capsule take 1 capsule (50 mg total) by mouth 2 (two) times daily ordered.  Discontinue Prozac and switch to Trintellix 10 mg a day and recheck in 3 weeks.   03/05/21 [name], MD (PCP) - Family Medicine - Dyspnea - Labs were ordered. EKG performed in office. shortness of breath is related to cardiac ischemia.  Patient refuses to go to the hospital [place].  After much discussion, I was able to convince the patient to go to the hospital but she adamantly refuses to allow me to call an ambulance.  I do not feel that the patient is in a life-threatening emergency at this moment.  Therefore I will compromise with the patient and allow her to drive herself to the local emergency room for further evaluation.    12/17/20 [name], MD (PCP) - Family Medicine - Blood blister - Recommended continue dressing changes for the remainder of this week and then resume normal behavior.  She  can reduce her torsemide back to 5 tablets in the morning and 4 tablets in the evening as the swelling is back to her baseline. Follow up as scheduled.    12/13/20 [name], MD (PCP) - Family Medicine - Blood blister  - aspirated the contents of the blood blister with a 18-gauge needle and a 20 cc syringe.  I was able  to completely empty the blood blister.  I then coated the wound with Silvadene, nonadherent gauze, and then wrapped the foot with Coban.  Recommended daily dressing changes.  Recheck next week.  Increase torsemide 100 mg twice daily until seen on Monday due to increased weight and edema   11/15/20 [name], MD (PCP) - Family Medicine - Chronic Diastolic CHF - Labs were ordered. cephALEXin (KEFLEX) 250 MG capsule take 1 capsule (250 mg total) by mouth daily prescribed. anesthetized the seborrheic keratosis on the left side of her neck and removed it using sterile technique with a razor blade. Continue the patient on torsemide 100 mg in the morning and 80 mg in the evening  We discussed the options and the patient would like to try low-dose Keflex 250 mg daily despite that it may have caused diarrhea in the past for prevention of other urinary tract infections to see if this helps.  If she develops diarrhea she will discontinue it immediately.     Recent consult visits:    04/09/21 Meridee Score, MD - Orthopedic surgery - Onychomycosis - No medication changes. Follow up in 3 months.    01/07/21 Meridee Score, MD - Orthopedic surgery- Idiopathic chronic venous hypertension of both lower extremities - Recommended patient wear the extra-large compression stocking she is currently wearing the double extra-large.  The socks were applied for her.  For her legs recommended exercise elevation and compression.Follow up in 3 months.    11/12/20  Meridee Score, MD - Orthopedic surgery- Idiopathic chronic venous hypertension of both lower extremities - No medication changes. Follow up in 3 months.      Hospital visits: 02/05/2021   Medication Reconciliation was completed by comparing discharge summary, patients EMR and Pharmacy list, and upon discussion with patient.   Admitted to the hospital on 02/05/21 due to Diabetic ketoacidosis. Discharge date was 02/09/21. Discharged from [place]?Medications Started at [facility] Discharge:?? Azithromycin (ZITHROMAX) 500 MG tablet and loperamide (IMODIUM) 2 MG capsule prescribed.   Medication Changes at Hospital Discharge: Changed Torsemide to $RemoveBefo'20mg'MhZdPowHjmR$  Take 2 tablets (40 mg total) by mouth 2 (two) times daily.   Medications Discontinued at Hospital Discharge: Metolazone 2.5 and Potassium Chloride 20 meq   Medications that remain the same after Hospital Discharge:??  All other medications will remain the same.     Hospital visits: 03/05/21   Medication Reconciliation was completed by comparing discharge summary, patients EMR and Pharmacy list, and upon discussion with patient.   Admitted to the hospital on 03/05/21 due to NSTEMI. Discharge date was 03/10/21. Discharged from [place]?Medications Started at [facility] Discharge:?? atorvastatin (LIPITOR) 80 MG tablet, isosorbide mononitrate (IMDUR) 30 MG 24 hr tablet, linagliptin (TRADJENTA) 5 MG TABS tablet, hydrALAZINE (APRESOLINE) 10 MG tablet, fosfomycin (MONUROL) 3 g PACK were prescribed.   Medication Changes at Hospital Discharge: ferrous sulfate 325 (65 FE) MG tablet and magnesium oxide (MAG-OX) 400 MG tablet were changed.   Medications Discontinued at Hospital Discharge: Lyrica, Trazodone, and Keflex were discontinued.    Medications that remain the same  after Hospital Discharge:??  All other medications will remain the same.       Objective:  Lab Results  Component Value Date   CREATININE 4.32 (H) 05/06/2021   BUN 103 (H) 05/06/2021   GFR 41.58 (L) 08/17/2013   GFRNONAA 10 (L) 05/06/2021   GFRAA 13 (L) 11/15/2020   NA 134 (L) 05/06/2021   K 3.4 (L) 05/06/2021   CALCIUM 8.4 (L) 05/06/2021   CO2 26 05/06/2021   GLUCOSE 419 (H) 05/06/2021    Lab Results  Component Value Date/Time   HGBA1C 12.2 (H) 03/05/2021 05:34 PM   HGBA1C 10.3 (H) 08/26/2020 08:01 AM   GFR 41.58 (L) 08/17/2013 02:46 PM   GFR 61.93 01/17/2011 02:36 PM   MICROALBUR  287.8 06/29/2020 01:58 PM   MICROALBUR 65.6 11/14/2016 09:25 AM    Last diabetic Eye exam:  Lab Results  Component Value Date/Time   HMDIABEYEEXA Retinopathy (A) 09/23/2017 12:00 AM    Last diabetic Foot exam: No results found for: HMDIABFOOTEX   Lab Results  Component Value Date   CHOL 172 03/06/2021   HDL 51 03/06/2021   LDLCALC 91 03/06/2021   TRIG 150 (H) 03/06/2021   CHOLHDL 3.4 03/06/2021    Hepatic Function Latest Ref Rng & Units 03/15/2021 08/22/2020 08/21/2020  Total Protein 6.1 - 8.1 g/dL 6.3 5.7(L) 6.3(L)  Albumin 3.5 - 5.0 g/dL - 2.8(L) 3.2(L)  AST 10 - 35 U/L 23 39 23  ALT 6 - 29 U/L _0 Alk Phosphatase 38 - 126 U/L - 189(H) 207(H)  Total Bilirubin 0.2 - 1.2 mg/dL 0.4 1.2 0.5  Bilirubin, Direct 0.0 - 0.2 mg/dL - - -    Lab Results  Component Value Date/Time   TSH 3.69 07/05/2020 12:47 PM   TSH 3.591 05/17/2020 04:59 PM   TSH 0.964 02/14/2018 11:18 AM   TSH 1.374 12/25/2012 06:00 PM    CBC Latest Ref Rng & Units 03/15/2021 03/09/2021 03/07/2021  WBC 3.8 - 10.8 Thousand/uL 9.0 6.5 7.5  Hemoglobin 11.7 - 15.5 g/dL 12.2 10.6(L) 11.4(L)  Hematocrit 35.0 - 45.0 % 38.7 34.2(L) 36.1  Platelets 140 - 400 Thousand/uL 182 159 158    No results found for: VD25OH  Clinical ASCVD: No  The ASCVD Risk score (Arnett DK, et al., 2019) failed to calculate for the following reasons:   The patient has a prior MI or stroke diagnosis    Depression screen [facility] 2/9 03/15/2021 02/04/2021 02/04/2021  Decreased Interest 3 0 0  Down, Depressed, Hopeless 3 0 0  PHQ - 2 Score 6 0 0  Altered sleeping 3 - -  Tired, decreased energy 3 - -  Change in appetite 3 - -  Feeling bad or failure about yourself  3 - -  Trouble concentrating 3 - -  Moving slowly or fidgety/restless 3 - -  Suicidal thoughts 1 - -  PHQ-9 Score 25 - -  Difficult doing work/chores Extremely dIfficult - -  Some recent data might be hidden    Social History   Tobacco Use  Smoking Status Former    Packs/day: 3.00   Years: 32.00   Pack years: 96.00   Types: Cigarettes   Quit date: 10/24/1997   Years since quitting: 23.5  Smokeless Tobacco Never   BP Readings from Last 3 Encounters:  05/06/21 108/70  04/04/21 128/68  04/02/21 118/70   Pulse Readings from Last 3 Encounters:  05/06/21 75  04/04/21 80  04/02/21 82   Wt Readings from  Last 3 Encounters:  05/06/21 265 lb 12.8 oz (120.6 kg)  04/04/21 273 lb (123.8 kg)  04/02/21 275 lb (124.7 kg)   BMI Readings from Last 3 Encounters:  05/06/21 44.23 kg/m  04/04/21 45.43 kg/m  04/02/21 45.76 kg/m    Assessment/Interventions: Review of patient past medical history, allergies, medications, health status, including review of consultants reports, laboratory and other test data, was performed as part of comprehensive evaluation and provision of chronic care management services.   SDOH:  (Social Determinants of Health) assessments and interventions performed: Yes  Financial Resource Strain: Low Risk    Difficulty of Paying Living Expenses: Not very hard    SDOH Screenings   Alcohol Screen: Low Risk    Last Alcohol Screening Score (AUDIT): 0  Depression (PHQ2-9): Medium Risk   PHQ-2 Score: 25  Financial Resource Strain: Low Risk    Difficulty of Paying Living Expenses: Not very hard  Food Insecurity: No Food Insecurity   Worried About Charity fundraiser in the Last Year: Never true   Ran Out of Food in the Last Year: Never true  Housing: Low Risk    Last Housing Risk Score: 0  Physical Activity: Not on file  Social Connections: Not on file  Stress: Not on file  Tobacco Use: Medium Risk   Smoking Tobacco Use: Former   Smokeless Tobacco Use: Never   Passive Exposure: Not on file  Transportation Needs: No Transportation Needs   Lack of Transportation (Medical): No   Lack of Transportation (Non-Medical): No    CCM Care Plan  Allergies  Allergen Reactions   Drug Ingredient [Cephalexin] Diarrhea    Pt reports  heavy diarrhea with use of keflex   Codeine Nausea And Vomiting and Other (See Comments)    Medications Reviewed Today     Reviewed by Jerl Mina, RN (Registered Nurse) on 05/06/21 at 1415  Med List Status: <None>   Medication Order Taking? Sig Documenting Provider Last Dose Status Informant  acetaminophen (TYLENOL) 500 MG tablet [id number] Yes Take 1,000 mg by mouth every 6 (six) hours as needed for moderate pain or headache. [provider] Taking Active Self  albuterol (VENTOLIN HFA) 108 (90 Base) MCG/ACT inhaler [id number] Yes TAKE 2 PUFFS BY MOUTH EVERY 6 HOURS AS NEEDED FOR WHEEZE OR SHORTNESS OF BREATH [place], [name], MD Taking Active   allopurinol (ZYLOPRIM) 100 MG tablet [id number] Yes Take 0.5 tablets (50 mg total) by mouth daily. Florencia Reasons, MD Taking Active   Ascorbic Acid (VITAMIN C) 1000 MG tablet [id number] Yes Take 1,000 mg by mouth daily. [provider] Taking Active Self  aspirin EC 81 MG tablet [id number] Yes Take 1 tablet (81 mg total) by mouth daily with breakfast. Roxan Hockey, MD Taking Active Self  atorvastatin (LIPITOR) 80 MG tablet [id number] Yes Take 1 tablet (80 mg total) by mouth daily. [place]abellones, [place], FNP Taking Active   carvedilol (COREG) 12.5 MG tablet [id number] Yes TAKE 1 TABLET (12.5 MG TOTAL) BY MOUTH 2 (TWO) TIMES DAILY WITH A MEAL. [name], MD Taking Active   cholecalciferol (VITAMIN D) 1000 units tablet [id number] Yes Take 1,000 Units by mouth daily with supper.  [provider] Taking Active Self  clopidogrel (PLAVIX) 75 MG tablet [id number] Yes TAKE 1 TABLET BY MOUTH DAILY WITH BREAKFAST. Darrick Grinder D, NP Taking Active   Cranberry 250 MG TABS [id number] Yes Take 500 mg by mouth daily. [provider] Taking Active Self  ferrous sulfate 325 (65  FE) MG tablet [id number] Yes Take 1 tablet (325 mg total) by mouth daily with breakfast. Reported on 08/21/2015 Florencia Reasons, MD Taking Active    guaiFENesin-dextromethorphan [facility] DM) 100-10 MG/5ML syrup [id number] Yes Take 10 mLs by mouth every 4 (four) hours as needed for cough. [place] Eric J, DO Taking Active Self  hydrALAZINE (APRESOLINE) 10 MG tablet [id number] Yes 1 tablet with food [provider] Taking Active   hydrocortisone cream 0.5 % [id number] Yes Apply 1 application topically 2 (two) times daily. Florencia Reasons, MD Taking Active   Insulin Disposable Pump (OMNIPOD DASH 5 PACK PODS) MISC [id number] Yes Inject into the skin See admin instructions. Use continuously with Novolin R - change every 72 hours. [provider] Taking Active Self           Med Note Isac Caddy, LENA B   Thu Apr 28, 2019  1:41 PM)    insulin NPH Human (HUMULIN N,NOVOLIN N) 100 UNIT/ML injection [id number] Yes Inject 15 Units into the skin See admin instructions. Inject 15 units subcutaneously in the morning if CBG >300; [provider] Taking Active Self           Med Note Isac Caddy, LENA B   Thu Apr 28, 2019  1:41 PM)    insulin regular (NOVOLIN R) 100 units/mL injection [id number] Yes Inject into the skin See admin instructions. Manually add bolus to continuous dose via OmniPod 3 times daily per sliding scale (CBG 80-150 7 units, 151-200 9 units, 201-250 12 units, 251-300 14 units, 301- 400 17 units) [provider] Taking Active Self           Med Note Dimitri Ped, AMANDA L   Tue Jul 24, 2020  1:31 PM)    isosorbide mononitrate (IMDUR) 30 MG 24 hr tablet [id number] Yes Take 0.5 tablets (15 mg total) by mouth daily. Florencia Reasons, MD Taking Active   KLOR-CON M20 20 MEQ tablet [id number] Yes TAKE 1 TABLET (20 MEQ TOTAL) BY MOUTH ONCE A WEEK. TAKE 20 MEQ WITH DOSE OF METOLAZONE 2.5 MG Bensimhon, Shaune Pascal, MD Taking Active   levothyroxine (SYNTHROID, LEVOTHROID) 50 MCG tablet [id number] Yes Take 1 tablet (50 mcg total) by mouth daily before breakfast. [name], PA-C Taking Active Self  linagliptin (TRADJENTA) 5 MG TABS tablet  [id number] Yes 1 tablet [provider] Taking Active   magnesium oxide (MAG-OX) 400 (240 Mg) MG tablet [id number] Yes Take 1 tablet by mouth daily. [provider] Taking Active   metolazone (ZAROXOLYN) 2.5 MG tablet [id number] Yes Take 1 tablet (2.5 mg total) by mouth daily. [place], Maricela Bo, FNP Taking Active   Multiple Vitamins-Minerals (AIRBORNE GUMMIES PO) [id number] Yes Take 2 tablets by mouth every evening. [provider] Taking Active Self  Multiple Vitamins-Minerals (AIRBORNE GUMMIES PO) [id number] Yes Take 2 tablets by mouth daily. [provider] Taking Active   Multiple Vitamins-Minerals (CENTRUM SILVER 50+WOMEN) TABS [id number] Yes Take 1 tablet by mouth every evening. [provider] Taking Active Self  nitroGLYCERIN (NITROSTAT) 0.4 MG SL tablet [id number] Yes PLACE 1 TABLET (0.4 MG TOTAL) UNDER THE TONGUE EVERY 5 (FIVE) MINUTES AS NEEDED FOR CHEST PAIN. Larey Dresser, MD Taking Active Self  Roma Schanz test strip [id number] Yes 3 (three) times daily. [provider] Taking Active Self  polyethylene glycol (MIRALAX / GLYCOLAX) 17 g packet [id number] Yes 1 packet mixed with 8 ounces of fluid [provider] Taking Active   pregabalin (LYRICA) 150 MG capsule [id number] Yes Take 150  mg by mouth 2 (two) times daily. [provider] Taking Active   sitaGLIPtin (JANUVIA) 25 MG tablet [id number] Yes Take 1 tablet (25 mg total) by mouth daily. [name], MD Taking Active   torsemide (DEMADEX) 20 MG tablet [id number] Yes Take 80 mg by mouth 2 (two) times daily. [provider] Taking Active Self  ursodiol (ACTIGALL) 500 MG tablet [id number] Yes Take 500 mg by mouth 3 (three) times daily. [provider] Taking Active Self  vitamin B-12 (CYANOCOBALAMIN) 1000 MCG tablet [id number] Yes Take 1,000 mcg by mouth daily. [provider] Taking Active Self  vortioxetine HBr (TRINTELLIX) 10 MG TABS tablet  [id number] Yes Take 1 tablet (10 mg total) by mouth daily. [name], MD Taking Active   zinc gluconate 50 MG tablet [id number] Yes Take 50 mg by mouth daily. [provider] Taking Active Self            Patient Active Problem List   Diagnosis Date Noted   NSTEMI (non-ST elevated myocardial infarction) ([place]) 03/05/2021   Acute renal failure superimposed on stage 4 chronic kidney disease ([place]) 08/22/2020   Hypoalbuminemia 05/25/2020   GERD (gastroesophageal reflux disease) 05/25/2020   Pressure injury of skin 05/17/2020   Type 2 diabetes mellitus with diabetic polyneuropathy, with long-term current use of insulin ([place]) 03/07/2020   Obesity, Class III, BMI 40-49.9 (morbid obesity) ([place]) 03/07/2020   Common bile duct (CBD) obstruction 05/28/2019   Benign neoplasm of ascending colon    Benign neoplasm of transverse colon    Benign neoplasm of descending colon    Benign neoplasm of sigmoid colon    Gastric polyps    Hyperkalemia 03/11/2019   Prolonged QT interval 03/11/2019   Onychomycosis 06/21/2018   Osteomyelitis of second toe of right foot ([place])    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 ([place])    PVD (peripheral vascular disease) ([place]) 10/26/2017   E-coli UTI 07/27/2017   Hypothyroidism 07/27/2017   PAH (pulmonary artery hypertension) ([facility])    Impaired ambulation 07/19/2017   Leg cramps 02/27/2017   Peripheral edema 01/12/2017   Diabetic neuropathy ([place]) 11/12/2016   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 mellitus ([place]) 10/24/2015   Anemia 10/03/2015   Generalized anxiety disorder 10/03/2015   Insomnia 10/03/2015   Hyperglycemia due to diabetes mellitus ([place]) 06/07/2015   Chronic diastolic CHF (congestive heart failure) ([place]) 06/07/2015   Non compliance with medical treatment 04/17/2014   Rotator cuff tear 03/14/2014   Class 3 obesity ([place]) 09/23/2013   Hypotension 12/25/2012   Urinary incontinence    MDD (major depressive disorder)  11/12/2010   RBBB (right bundle branch block)    Coronary artery disease    Hyperlipemia 01/22/2009   Essential hypertension 01/22/2009    Immunization History  Administered Date(s) Administered   DTaP 07/19/2012   Fluad Quad(high Dose 65+) 01/17/2019   Influenza Split 02/23/2013   Influenza, High Dose Seasonal PF 03/03/2016   Influenza, Quadrivalent, Recombinant, Inj, Pf 02/01/2018, 02/21/2020   Influenza,inj,Quad PF,6+ Mos 02/02/2014, 03/26/2015, 02/27/2017   Influenza-Unspecified 02/01/2018, 02/21/2020   Moderna Sars-Covid-2 Vaccination 07/30/2019, 08/27/2019, 06/18/2020   Pneumococcal Conjugate-13 06/02/2017   Pneumococcal Polysaccharide-23 12/26/2012, 07/12/2015   Zoster Recombinat (Shingrix) 06/02/2017, 09/25/2017    Conditions to be addressed/monitored:  CHF, HTN, NSTEMI, DM, Stage 4 CKD, GAD, Insomnia     Patient Care Plan: General Pharmacy (Adult)     Problem Identified: CHF, HTN, NSTEMI, DM, Stage 4 CKD, GAD, Insomnia  Priority: High  Onset Date: 05/23/2021     Long-Range Goal: Patient-Specific Goal   Start Date: 05/23/2021  Expected End Date: 11/22/2021  This Visit's Progress: On track  Priority: High  Note:   Current Barriers:  Unable to achieve control of glucose   Pharmacist Clinical Goal(s):  Patient will achieve control of glucose as evidenced by improved A1c through collaboration with PharmD and provider.   Interventions: 1:1 collaboration with [name], MD regarding development and update of comprehensive plan of care as evidenced by provider attestation and co-signature Inter-disciplinary care team collaboration (see longitudinal plan of care) Comprehensive medication review performed; medication list updated in electronic medical record  Hypertension (BP goal <130/80) -Controlled -Current treatment: Carvedilol 12.5mg  BID Imdur 30mg  daily -Medications previously tried: losartan  -Current home readings: "normal" no readings to  provide -Current dietary habits: see DM -Current exercise habits: minimal at home -Denies hypotensive/hypertensive symptoms -Educated on BP goals and benefits of medications for prevention of heart attack, stroke and kidney damage; Importance of home blood pressure monitoring; Symptoms of hypotension and importance of maintaining adequate hydration; -Counseled to monitor BP at home a few times per week, document, and provide log at future appointments -Recommended to continue current medication Contact providers if BP is consistently elevated > 130/90.  Diabetes (A1c goal <7%) -Not ideally controlled -Current medications: Januvia 25mg  daily Humulin N 15 units am if glucose > 300 Humulin R via sliding scale -Medications previously tried: metformin  -Current home glucose readings - checking sugar 3-4 times per day Unclear when her readings were from but the majority of her readings are > 200 with on reading close to 600 and a few that were undetectable -Denies hypoglycemic/hyperglycemic symptoms -Current meal patterns:  breakfast: [place]  lunch: soups - chicken noodle, tomato, chicken and rice  dinner: chicken or pork, meatloaf, tacos in a tortilla snacks: fig bar, crackers, oranges, apples, gelatin w/ fruit? drinks: pepsi, gatorade, pedialyte, water -Current exercise: none -Educated on A1c and blood sugar goals; Complications of diabetes including kidney damage, retinal damage, and cardiovascular disease; Prevention and management of hypoglycemic episodes; Benefits of routine self-monitoring of blood sugar; Considerable time spent on counseling her on diet changes and ways she could change her current diet to bring down her glucose. -Counseled to check feet daily and get yearly eye exams -Counseled on diet and exercise extensively Will collaborate with endocrine, per patient report she is not having a basal rate through her omnipod.  She only gives the NPH insulin when her  glucose is > 300 in the morning. Would recommend basal/bolus regimen for her based on severely uncontrolled glucose.  Heart Failure (Goal: manage symptoms and prevent exacerbations) -Controlled -Last ejection fraction: 55-60%  -Current treatment: Torsemide 80mg  BID Carvedilol 12.5mg  BID Metolazone 2.5mg  - once weekly -Medications previously tried: none noted  -Current home BP/HR readings: 'normal" -Current dietary habits: see DM -Current exercise habits: none -Educated on Benefits of medications for managing symptoms and prolonging life Importance of weighing daily; if you gain more than 3 pounds in one day or 5 pounds in one week, contact providers Proper diuretic administration and potassium supplementation -Counseled on diet and exercise extensively Recommended to continue current medication  Patient Goals/Self-Care Activities Patient will:  - take medications as prescribed as evidenced by patient report and record review check glucose daily, document, and provide at future appointments collaborate with provider on medication access solutions  Follow Up Plan: The care management team will reach out to the patient again  over the next 90 days.         Medication Assistance: Application for [employer]  medication assistance program. in process.  Anticipated assistance start date unknown.  See plan of care for additional detail.  Compliance/Adherence/Medication fill history: Care Gaps: Foot exam DEXA scan   Patient's preferred pharmacy is:  [id number] [id number]: [phone]: [phone] Uses pill box? Yes Pt endorses 100% compliance  We discussed: Benefits of medication synchronization, packaging and delivery as well as enhanced pharmacist oversight with [employer]. Patient decided to: Continue current medication management strategy  Care Plan and  Follow Up Patient Decision:  Patient agrees to Care Plan and Follow-up.  Plan: The care management team will reach out to the patient again over the next 90 days.  CBeverly Milch PharmD, CPP Clinical Pharmacist Practitioner [phone]

## 2021-05-10 ENCOUNTER — Telehealth (HOSPITAL_COMMUNITY): Payer: Self-pay | Admitting: Surgery

## 2021-05-10 DIAGNOSIS — I5032 Chronic diastolic (congestive) heart failure: Secondary | ICD-10-CM

## 2021-05-10 MED ORDER — POTASSIUM CHLORIDE CRYS ER 20 MEQ PO TBCR: 20.0000 meq | EXTENDED_RELEASE_TABLET | ORAL | 1 refills | Status: DC

## 2021-05-10 NOTE — Telephone Encounter (Signed)
Patient called and results reviewed as well as recommendations per Dr. Haroldine Laws.  Patient scheduled for repeat labwork 05/16/21.  Medications updated in [facility].

## 2021-05-10 NOTE — Telephone Encounter (Signed)
-----   Message from Jolaine Artist, MD sent at 05/10/2021 12:29 PM EST ----- Take kcl 25meq Monday and Friday  repeat bmet end of next week please

## 2021-05-12 ENCOUNTER — Other Ambulatory Visit: Payer: Self-pay | Admitting: Family Medicine

## 2021-05-12 DIAGNOSIS — N390 Urinary tract infection, site not specified: Secondary | ICD-10-CM

## 2021-05-15 ENCOUNTER — Encounter: Payer: Self-pay | Admitting: *Deleted

## 2021-05-15 ENCOUNTER — Ambulatory Visit (INDEPENDENT_AMBULATORY_CARE_PROVIDER_SITE_OTHER): Payer: HMO | Admitting: *Deleted

## 2021-05-15 DIAGNOSIS — E1165 Type 2 diabetes mellitus with hyperglycemia: Secondary | ICD-10-CM

## 2021-05-15 DIAGNOSIS — I5032 Chronic diastolic (congestive) heart failure: Secondary | ICD-10-CM

## 2021-05-15 NOTE — Chronic Care Management (AMB) (Signed)
Chronic Care Management   CCM RN Visit Note  05/15/2021 Name: Susan [name] MRN: [id number] DOB: [birth year]  Subjective: Susan [name] is a 70 y.o. year old female who is a primary care patient of [name], [name], MD. The care management team was consulted for assistance with disease management and care coordination needs.    Engaged with patient by telephone for follow up visit in response to provider referral for case management and/or care coordination services.   Consent to Services:  The patient was given information about Chronic Care Management services, agreed to services, and gave verbal consent prior to initiation of services.  Please see initial visit note for detailed documentation.   Patient agreed to services and verbal consent obtained.   Assessment: Review of patient past medical history, allergies, medications, health status, including review of consultants reports, laboratory and other test data, was performed as part of comprehensive evaluation and provision of chronic care management services.   SDOH (Social Determinants of Health) assessments and interventions performed:    CCM Care Plan  Allergies  Allergen Reactions   Drug Ingredient [Cephalexin] Diarrhea    Pt reports heavy diarrhea with use of keflex   Codeine Nausea And Vomiting and Other (See Comments)    Outpatient Encounter Medications as of 05/15/2021  Medication Sig   acetaminophen (TYLENOL) 500 MG tablet Take 1,000 mg by mouth every 6 (six) hours as needed for moderate pain or headache.   albuterol (VENTOLIN HFA) 108 (90 Base) MCG/ACT inhaler TAKE 2 PUFFS BY MOUTH EVERY 6 HOURS AS NEEDED FOR WHEEZE OR SHORTNESS OF BREATH   allopurinol (ZYLOPRIM) 100 MG tablet Take 0.5 tablets (50 mg total) by mouth daily.   Ascorbic Acid (VITAMIN C) 1000 MG tablet Take 1,000 mg by mouth daily.   aspirin EC 81 MG tablet Take 1 tablet (81 mg total) by mouth daily with breakfast.   atorvastatin (LIPITOR) 80 MG tablet  Take 1 tablet (80 mg total) by mouth daily.   carvedilol (COREG) 12.5 MG tablet TAKE 1 TABLET (12.5 MG TOTAL) BY MOUTH 2 (TWO) TIMES DAILY WITH A MEAL.   cholecalciferol (VITAMIN D) 1000 units tablet Take 1,000 Units by mouth daily with supper.    clopidogrel (PLAVIX) 75 MG tablet TAKE 1 TABLET BY MOUTH DAILY WITH BREAKFAST.   Cranberry 250 MG TABS Take 500 mg by mouth daily.   ferrous sulfate 325 (65 FE) MG tablet Take 1 tablet (325 mg total) by mouth daily with breakfast. Reported on 08/21/2015   guaiFENesin-dextromethorphan (ROBITUSSIN DM) 100-10 MG/5ML syrup Take 10 mLs by mouth every 4 (four) hours as needed for cough.   hydrocortisone cream 0.5 % Apply 1 application topically 2 (two) times daily.   Insulin Disposable Pump (OMNIPOD DASH 5 PACK PODS) MISC Inject into the skin See admin instructions. Use continuously with Novolin R - change every 72 hours.   insulin NPH Human (HUMULIN N,NOVOLIN N) 100 UNIT/ML injection Inject 15 Units into the skin See admin instructions. Inject 15 units subcutaneously in the morning if CBG >300;   insulin regular (NOVOLIN R) 100 units/mL injection Inject into the skin See admin instructions. Manually add bolus to continuous dose via OmniPod 3 times daily per sliding scale (CBG 80-150 7 units, 151-200 9 units, 201-250 12 units, 251-300 14 units, 301- 400 17 units)   isosorbide mononitrate (IMDUR) 30 MG 24 hr tablet Take 0.5 tablets (15 mg total) by mouth daily.   levothyroxine (SYNTHROID, LEVOTHROID) 50 MCG tablet Take 1 tablet (50 mcg  total) by mouth daily before breakfast.   linagliptin (TRADJENTA) 5 MG TABS tablet 1 tablet   magnesium oxide (MAG-OX) 400 (240 Mg) MG tablet Take 1 tablet by mouth daily.   metolazone (ZAROXOLYN) 2.5 MG tablet Take 1 tablet (2.5 mg total) by mouth daily.   Multiple Vitamins-Minerals (AIRBORNE GUMMIES PO) Take 2 tablets by mouth every evening.   Multiple Vitamins-Minerals (CENTRUM SILVER 50+WOMEN) TABS Take 1 tablet by mouth every  evening.   nitroGLYCERIN (NITROSTAT) 0.4 MG SL tablet PLACE 1 TABLET (0.4 MG TOTAL) UNDER THE TONGUE EVERY 5 (FIVE) MINUTES AS NEEDED FOR CHEST PAIN.   ONETOUCH VERIO test strip 3 (three) times daily.   polyethylene glycol (MIRALAX / GLYCOLAX) 17 g packet 1 packet mixed with 8 ounces of fluid   potassium chloride SA (KLOR-CON M20) 20 MEQ tablet Take 1 tablet (20 mEq total) by mouth 2 (two) times a week. Monday and Friday   pregabalin (LYRICA) 150 MG capsule Take 150 mg by mouth 2 (two) times daily.   sitaGLIPtin (JANUVIA) 25 MG tablet Take 1 tablet (25 mg total) by mouth daily.   torsemide (DEMADEX) 20 MG tablet Take 80 mg by mouth 2 (two) times daily.   ursodiol (ACTIGALL) 500 MG tablet Take 500 mg by mouth 3 (three) times daily.   vitamin B-12 (CYANOCOBALAMIN) 1000 MCG tablet Take 1,000 mcg by mouth daily.   vortioxetine HBr (TRINTELLIX) 10 MG TABS tablet Take 1 tablet (10 mg total) by mouth daily.   zinc gluconate 50 MG tablet Take 50 mg by mouth daily.   Multiple Vitamins-Minerals (AIRBORNE GUMMIES PO) Take 2 tablets by mouth daily. (Patient not taking: Reported on 05/15/2021)   No facility-administered encounter medications on file as of 05/15/2021.    Patient Active Problem List   Diagnosis Date Noted   NSTEMI (non-ST elevated myocardial infarction) ([place]) 03/05/2021   Acute renal failure superimposed on stage 4 chronic kidney disease ([place]) 08/22/2020   Hypoalbuminemia 05/25/2020   GERD (gastroesophageal reflux disease) 05/25/2020   Pressure injury of skin 05/17/2020   Type 2 diabetes mellitus with diabetic polyneuropathy, with long-term current use of insulin ([place]) 03/07/2020   Obesity, Class III, BMI 40-49.9 (morbid obesity) ([place]) 03/07/2020   Common bile duct (CBD) obstruction 05/28/2019   Benign neoplasm of ascending colon    Benign neoplasm of transverse colon    Benign neoplasm of descending colon    Benign neoplasm of sigmoid colon    Gastric polyps    Hyperkalemia  03/11/2019   Prolonged QT interval 03/11/2019   Onychomycosis 06/21/2018   Osteomyelitis of second toe of right foot ([place])    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 ([facility])    PVD (peripheral vascular disease) ([place]) 10/26/2017   E-coli UTI 07/27/2017   Hypothyroidism 07/27/2017   PAH (pulmonary artery hypertension) ([facility])    Impaired ambulation 07/19/2017   Leg cramps 02/27/2017   Peripheral edema 01/12/2017   Diabetic neuropathy ([place]) 11/12/2016   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 mellitus ([place]) 10/24/2015   Anemia 10/03/2015   Generalized anxiety disorder 10/03/2015   Insomnia 10/03/2015   Hyperglycemia due to diabetes mellitus ([place]) 06/07/2015   Chronic diastolic CHF (congestive heart failure) ([place]) 06/07/2015   Non compliance with medical treatment 04/17/2014   Rotator cuff tear 03/14/2014   Class 3 obesity ([place]) 09/23/2013   Hypotension 12/25/2012   Urinary incontinence    MDD (major depressive disorder) 11/12/2010   RBBB (right bundle branch block)  Coronary artery disease    Hyperlipemia 01/22/2009   Essential hypertension 01/22/2009    Conditions to be addressed/monitored:CHF and DMII  Care Plan : RN Care Manager plan of care  Updates made by Kassie Mends, RN since 05/15/2021 12:00 AM     Problem: No plan of care established for management of chronic disease states (CHF, DM2, HTN, HLD, CKD stage 4)   Priority: High     Long-Range Goal: Development of plan of care for chronic disease management (CHF, DM2, HTN, CAD, HLD, CKD stage 4)   Start Date: 04/08/2021  Expected End Date: 10/05/2021  Priority: High  Note:   Current Barriers:  Knowledge Deficits related to plan of care for management of CHF and DMII  Financial Constraints.  Pt reports today she is unable to afford trintellix 10mg  tablet at $100/ per month and has two tablets left, also does not have ursodiol 500mg  and cannot afford. Knowledge deficit related to basic heart failure  pathophysiology and self care management- pt lives with spouse, pt is independent in most all aspects of her care, has walker and cane to use as needed, continues to drive. Pt reports she forgets sometimes to weigh, weight today 270 pounds. Pt in hospital 03/05/21 for NSTEMI, pt reports she had chest pain within the past week and nitroglycerin relieved, has nitroglycerin on hand, reports she is taking medications as prescribed except for ursodiol which she cannot afford.  Patient reports she is taking new medication trintellix and this has helped with depression and she would like to continue taking, reports she is taking tradjenta and taking as prescribed.  Pt reports she has "sores on left leg" for several days now and has not reported to the doctor, reports legs have some drainage and pt feels this is why her blood sugar has been elevated over the past 1- 2 weeks. Pt reports she is not going to ED to have her legs checked if it comes to that and she is adamant about this. Patient reports she checks blood sugar 3 x per day with ranges 200-300's range.  RNCM Clinical Goal(s):  Patient will verbalize understanding of plan for management of CHF and DMII as evidenced by review of EHR, pt report. take all medications exactly as prescribed and will call provider for medication related questions as evidenced by review of EHR, per pt report and collaboration with CCM pharmacist.    attend all scheduled medical appointments:  12/29 CCM pharmacist as evidenced by review of EHR, pt report and        through collaboration with RN Care manager, provider, and care team.   Interventions: 1:1 collaboration with primary care provider regarding development and update of comprehensive plan of care as evidenced by provider attestation and co-signature Inter-disciplinary care team collaboration (see longitudinal plan of care) Evaluation of current treatment plan related to  self management and patient's adherence to plan as  established by provider  Diabetes Interventions: Assessed patient's understanding of A1c goal: <7% Reviewed medications with patient and discussed importance of medication adherence; Counseled on importance of regular laboratory monitoring as prescribed; Reviewed scheduled/upcoming provider appointments including:  12/29 CCM pharmaicst; Review of patient status, including review of consultants reports, relevant laboratory and other test results, and medications completed; In basket sent to CCM pharmacist reporting patient is unable to afford trintellix 10mg  at $100/ per month and ursodiol 500 mg Reviewed carbohydrate modified diet Asked patient to verify if she is taking tradjenta 5mg  and pt reports she is  Reviewed symptoms hypo/hyperglycemia and actions to take Reviewed signs/ symptoms of infection and actions to take, reviewed effect on blood sugar Telephone call to primary care provider office, left voicemail for triage nurse for Dr. Dennard Schaumann and reported the above findings of sores on left leg with some drainage, afebrile, elevated CBG over past 1-2 weeks  In basket sent high priority to primary care provider reporting these same findings with left leg and elevated CBG and pt refuses to go to ED Lab Results  Component Value Date   HGBA1C 12.2 (H) 03/05/2021  Heart Failure Interventions: Discussed importance of daily weight and advised patient to weigh and record daily; Reviewed role of diuretics in prevention of fluid overload and management of heart failure; Provided patient with education about the role of exercise in the management of heart failure; Reviewed low sodium diet Reviewed HF action plan with emphasis on yellow zone Reinforced importance of having nitroglycerin on hand and reviewed instructions on how to take and calling 911 if no relief  Patient Goals/Self-Care Activities: Patient will self administer medications as prescribed as evidenced by self report/primary caregiver  report  Patient will attend all scheduled provider appointments as evidenced by clinician review of documented attendance to scheduled appointments and patient/caregiver report Patient will call provider office for new concerns or questions as evidenced by review of documented incoming telephone call notes and patient report  Expect a call from CCM pharmacist about trintellix and ursodiol costs Call your doctor if you gain 3 pounds overnight or 5 pounds in a week Follow low sodium diet Follow carbohydrate modified diet- be mindful of carbohydrate intake at each meal If you have chest pain, use nitroglycerin x 3, if no relief, call 911 Go to emergency room if your sores on your legs worsen with redness, drainage, fever and blood sugar continues to be elevated  Follow Up Plan:  Telephone follow up appointment with care management team member scheduled for:  05/31/2021       Plan:Telephone follow up appointment with care management team member scheduled for:  05/31/2021  Jacqlyn Larsen [facility], BSN RN Case Manager [facility] [phone]

## 2021-05-15 NOTE — Patient Instructions (Signed)
Visit Information  Thank you for taking time to visit with me today. Please don't hesitate to contact me if I can be of assistance to you before our next scheduled telephone appointment.  Following are the goals we discussed today:  Patient will self administer medications as prescribed as evidenced by self report/primary caregiver report  Patient will attend all scheduled provider appointments as evidenced by clinician review of documented attendance to scheduled appointments and patient/caregiver report Patient will call provider office for new concerns or questions as evidenced by review of documented incoming telephone call notes and patient report  Expect a call from CCM pharmacist about trintellix and ursodiol costs Call your doctor if you gain 3 pounds overnight or 5 pounds in a week Follow low sodium diet Follow carbohydrate modified diet- be mindful of carbohydrate intake at each meal If you have chest pain, use nitroglycerin x 3, if no relief, call 911 Go to emergency room if your sores on your legs worsen with redness, drainage, fever and blood sugar continues to be elevated  Our next appointment is by telephone on 05/31/2021 at 130 pm  Please call the care guide team at [phone] if you need to cancel or reschedule your appointment.   If you are experiencing a Mental Health or [place] or need someone to talk to, please call the [place] National Suicide Prevention Lifeline: 380-274-3504 or TTY: 215 280 2392 TTY 619-700-6378) to talk to a trained counselor call 1-800-273-TALK (toll free, 24 hour hotline) go to [facility] [place] [phone]) call 911   The patient verbalized understanding of instructions, educational materials, and care plan provided today and declined offer to receive copy of patient instructions, educational materials, and care plan.   Jacqlyn Larsen RNC, BSN RN Case Manager [facility] [phone]

## 2021-05-16 ENCOUNTER — Telehealth (HOSPITAL_COMMUNITY): Payer: Self-pay | Admitting: *Deleted

## 2021-05-16 ENCOUNTER — Other Ambulatory Visit: Payer: Self-pay

## 2021-05-16 ENCOUNTER — Ambulatory Visit (HOSPITAL_COMMUNITY)
Admission: RE | Admit: 2021-05-16 | Discharge: 2021-05-16 | Disposition: A | Discharge status: Home / Self Care | Payer: HMO | Source: Ambulatory Visit | Attending: Internal Medicine | Admitting: Internal Medicine

## 2021-05-16 DIAGNOSIS — I5032 Chronic diastolic (congestive) heart failure: Secondary | ICD-10-CM | POA: Diagnosis not present

## 2021-05-16 LAB — BASIC METABOLIC PANEL
Anion gap: 12 (ref 5–15)
BUN: 82 mg/dL — ABNORMAL HIGH (ref 8–23)
CO2: 26 mmol/L (ref 22–32)
Calcium: 8.4 mg/dL — ABNORMAL LOW (ref 8.9–10.3)
Chloride: 94 mmol/L — ABNORMAL LOW (ref 98–111)
Creatinine, Ser: 3.42 mg/dL — ABNORMAL HIGH (ref 0.44–1.00)
GFR, Estimated: 14 mL/min — ABNORMAL LOW (ref >60)
Glucose, Bld: 592 mg/dL (ref 70–99)
Potassium: 4 mmol/L (ref 3.5–5.1)
Sodium: 132 mmol/L — ABNORMAL LOW (ref 135–145)

## 2021-05-16 NOTE — Telephone Encounter (Signed)
Second call to pt no answer/left detailed vm advising pt to go to the emergency room and return our call

## 2021-05-16 NOTE — Telephone Encounter (Signed)
Critical glucose reported from main lab   Glucose 592  Called to advise pt to go to the emergency room no answer.no vm.

## 2021-05-17 ENCOUNTER — Encounter: Payer: Self-pay | Admitting: *Deleted

## 2021-05-17 ENCOUNTER — Telehealth: Payer: HMO

## 2021-05-17 ENCOUNTER — Telehealth (HOSPITAL_COMMUNITY): Payer: Self-pay | Admitting: Surgery

## 2021-05-17 ENCOUNTER — Telehealth: Payer: Self-pay | Admitting: *Deleted

## 2021-05-17 DIAGNOSIS — I5032 Chronic diastolic (congestive) heart failure: Secondary | ICD-10-CM | POA: Diagnosis not present

## 2021-05-17 NOTE — Telephone Encounter (Signed)
-----   Message from Rafael Bihari,  sent at 05/17/2021  8:01 AM EST ----- Critically high blood glucose. Needs to go to ED

## 2021-05-17 NOTE — Telephone Encounter (Signed)
I called patient and spoke to her about results and reommendations per [name] NP regarding critical blood sugar from yesterday.  She tells me that she has checked this morning and it is 330.  She took her insulin 30 minutes ago.  She refuses to go to the emergency room at this time.  I strongly encouraged her to follow-up with her PCP to get her blood sugar under better control.

## 2021-05-17 NOTE — Telephone Encounter (Signed)
°  Care Management   Follow Up Note   05/16/2021 Name: Susan [name] MRN: [id number] DOB: 07-18-1950   Referred by: [name], MD Reason for referral : Chronic Care Management (DM2, CHF)   An unsuccessful telephone outreach was attempted today. The patient was referred to the case management team for assistance with care management and care coordination.   RN care manager left detailed voicemail informing patient received message back from her primary care provider that she can be seen in his office for assessment of  wounds on her leg and advised pt to call primary care provider and schedule an appointment as soon as possible this morning, also informed patient to contact her endocrinologist for any continuing issues with blood sugar.  Follow Up Plan: Telephone follow up appointment with care management team member scheduled for:  05/31/2021  Jacqlyn Larsen [facility], BSN RN Case Manager [facility] [phone]

## 2021-05-17 NOTE — Progress Notes (Signed)
ERROR

## 2021-05-23 ENCOUNTER — Other Ambulatory Visit: Payer: Self-pay

## 2021-05-23 ENCOUNTER — Ambulatory Visit (INDEPENDENT_AMBULATORY_CARE_PROVIDER_SITE_OTHER): Payer: HMO | Admitting: Nurse Practitioner

## 2021-05-23 ENCOUNTER — Encounter: Payer: Self-pay | Admitting: Nurse Practitioner

## 2021-05-23 ENCOUNTER — Ambulatory Visit: Payer: HMO | Admitting: Pharmacist

## 2021-05-23 VITALS — BP 118/76 | HR 74 | Temp 97.5°F | Resp 14 | Ht 66.0 in | Wt 270.0 lb

## 2021-05-23 DIAGNOSIS — L03116 Cellulitis of left lower limb: Secondary | ICD-10-CM

## 2021-05-23 DIAGNOSIS — I83029 Varicose veins of left lower extremity with ulcer of unspecified site: Secondary | ICD-10-CM | POA: Diagnosis not present

## 2021-05-23 DIAGNOSIS — E1142 Type 2 diabetes mellitus with diabetic polyneuropathy: Secondary | ICD-10-CM

## 2021-05-23 DIAGNOSIS — F339 Major depressive disorder, recurrent, unspecified: Secondary | ICD-10-CM

## 2021-05-23 DIAGNOSIS — L97929 Non-pressure chronic ulcer of unspecified part of left lower leg with unspecified severity: Secondary | ICD-10-CM

## 2021-05-23 DIAGNOSIS — J069 Acute upper respiratory infection, unspecified: Secondary | ICD-10-CM | POA: Diagnosis not present

## 2021-05-23 DIAGNOSIS — I739 Peripheral vascular disease, unspecified: Secondary | ICD-10-CM | POA: Diagnosis not present

## 2021-05-23 DIAGNOSIS — I10 Essential (primary) hypertension: Secondary | ICD-10-CM

## 2021-05-23 DIAGNOSIS — I5032 Chronic diastolic (congestive) heart failure: Secondary | ICD-10-CM

## 2021-05-23 MED ORDER — DOXYCYCLINE HYCLATE 100 MG PO TABS: 100.0000 mg | ORAL_TABLET | Freq: Two times a day (BID) | ORAL | 0 refills | Status: AC

## 2021-05-23 NOTE — Progress Notes (Signed)
Subjective:    Patient ID: Susan [name], female    DOB: 1950-11-29, 70 y.o.   MRN: [id number]  HPI: Susan [name] is a 70 y.o. female presenting for lower extremity swelling.  Also reports she feels "terrible."  Chief Complaint  Patient presents with   Edema    Leg swelling Bleeding for 3 weeks Need something for a cough   LOWER EXTREMITY SWELLING Currently follows with heart failure clinic.  Taking torsemide 80 mg twice daily and metolazone on Wednesday.  Reports she forgot metolazone yesterday.  Noticed left leg was swelling with wounds about 3 weeks ago.  Reports she is trying to change the dressings on the wounds every couple of days.  She has been wearing compression stockings. Also follows with Nephrology - Dr. Hollie Salk.  Onset: 3 weeks ago Location: left lower extremity Duration: week Pain: yes, sharp pain and throbbing Severity: moderate Characteristics: throbbing Timing: comes and goes Aggravating Factors: walking on Relieving Factors: elevation, compression  UPPER RESPIRATORY TRACT INFECTION Onset: 12/24 Fever: no Body aches: yes Chills: yes Cough: yes Shortness of breath: yes with cough Wheezing: no Chest pain: yes - took nitroglycerin and this helped Chest tightness: no Chest congestion: yes Nasal congestion: yes Runny nose: yes Post nasal drip: no Sneezing: no Sore throat: no Swollen glands: no Sinus pressure: no Headache: yes Face pain: no Toothache: no Ear pain: no  Ear pressure: no  Eyes red/itching:no Eye drainage/crusting: no  Nausea: yes Vomiting: no Diarrhea: yes  Change in appetite:  yes decreased   Loss of taste/smell: no  Rash: no Fatigue: yes Sick contacts: no Strep contacts: no  Context: worse Recurrent sinusitis: no Treatments attempted: Nyquil, Dyquil, Mucinex Relief with OTC medications: yes  Allergies  Allergen Reactions   Drug Ingredient [Cephalexin] Diarrhea    Pt reports heavy diarrhea with use of keflex   Codeine  Nausea And Vomiting and Other (See Comments)    Outpatient Encounter Medications as of 05/23/2021  Medication Sig Note   acetaminophen (TYLENOL) 500 MG tablet Take 1,000 mg by mouth every 6 (six) hours as needed for moderate pain or headache.    albuterol (VENTOLIN HFA) 108 (90 Base) MCG/ACT inhaler TAKE 2 PUFFS BY MOUTH EVERY 6 HOURS AS NEEDED FOR WHEEZE OR SHORTNESS OF BREATH    allopurinol (ZYLOPRIM) 100 MG tablet Take 0.5 tablets (50 mg total) by mouth daily.    Ascorbic Acid (VITAMIN C) 1000 MG tablet Take 1,000 mg by mouth daily.    aspirin EC 81 MG tablet Take 1 tablet (81 mg total) by mouth daily with breakfast.    atorvastatin (LIPITOR) 80 MG tablet Take 1 tablet (80 mg total) by mouth daily.    carvedilol (COREG) 12.5 MG tablet TAKE 1 TABLET (12.5 MG TOTAL) BY MOUTH 2 (TWO) TIMES DAILY WITH A MEAL.    cholecalciferol (VITAMIN D) 1000 units tablet Take 1,000 Units by mouth daily with supper.     clopidogrel (PLAVIX) 75 MG tablet TAKE 1 TABLET BY MOUTH DAILY WITH BREAKFAST.    Cranberry 250 MG TABS Take 500 mg by mouth daily.    doxycycline (VIBRA-TABS) 100 MG tablet Take 1 tablet (100 mg total) by mouth 2 (two) times daily for 7 days.    ferrous sulfate 325 (65 FE) MG tablet Take 1 tablet (325 mg total) by mouth daily with breakfast. Reported on 08/21/2015 05/23/2021: Taking twice daily   guaiFENesin-dextromethorphan (ROBITUSSIN DM) 100-10 MG/5ML syrup Take 10 mLs by mouth every 4 (four) hours as  needed for cough.    hydrocortisone cream 0.5 % Apply 1 application topically 2 (two) times daily.    Insulin Disposable Pump (OMNIPOD DASH 5 PACK PODS) MISC Inject into the skin See admin instructions. Use continuously with Novolin R - change every 72 hours.    insulin NPH Human (HUMULIN N,NOVOLIN N) 100 UNIT/ML injection Inject 15 Units into the skin See admin instructions. Inject 15 units subcutaneously in the morning if CBG >300;    insulin regular (NOVOLIN R) 100 units/mL injection Inject  into the skin See admin instructions. Manually add bolus to continuous dose via OmniPod 3 times daily per sliding scale (CBG 80-150 7 units, 151-200 9 units, 201-250 12 units, 251-300 14 units, 301- 400 17 units)    isosorbide mononitrate (IMDUR) 30 MG 24 hr tablet Take 0.5 tablets (15 mg total) by mouth daily.    levothyroxine (SYNTHROID, LEVOTHROID) 50 MCG tablet Take 1 tablet (50 mcg total) by mouth daily before breakfast.    magnesium oxide (MAG-OX) 400 (240 Mg) MG tablet Take 1 tablet by mouth daily.    metolazone (ZAROXOLYN) 2.5 MG tablet Take 1 tablet (2.5 mg total) by mouth daily. 05/23/2021: Taking once weekly   Multiple Vitamins-Minerals (AIRBORNE GUMMIES PO) Take 2 tablets by mouth every evening.    Multiple Vitamins-Minerals (CENTRUM SILVER 50+WOMEN) TABS Take 1 tablet by mouth every evening.    nitroGLYCERIN (NITROSTAT) 0.4 MG SL tablet PLACE 1 TABLET (0.4 MG TOTAL) UNDER THE TONGUE EVERY 5 (FIVE) MINUTES AS NEEDED FOR CHEST PAIN.    ONETOUCH VERIO test strip 3 (three) times daily.    polyethylene glycol (MIRALAX / GLYCOLAX) 17 g packet 1 packet mixed with 8 ounces of fluid    potassium chloride SA (KLOR-CON M20) 20 MEQ tablet Take 1 tablet (20 mEq total) by mouth 2 (two) times a week. Monday and Friday    pregabalin (LYRICA) 150 MG capsule Take 150 mg by mouth 2 (two) times daily.    sitaGLIPtin (JANUVIA) 25 MG tablet Take 1 tablet (25 mg total) by mouth daily.    torsemide (DEMADEX) 20 MG tablet Take 80 mg by mouth 2 (two) times daily.    ursodiol (ACTIGALL) 500 MG tablet Take 500 mg by mouth 3 (three) times daily.    vitamin B-12 (CYANOCOBALAMIN) 1000 MCG tablet Take 1,000 mcg by mouth daily.    vortioxetine HBr (TRINTELLIX) 10 MG TABS tablet Take 1 tablet (10 mg total) by mouth daily.    zinc gluconate 50 MG tablet Take 50 mg by mouth daily.    [DISCONTINUED] linagliptin (TRADJENTA) 5 MG TABS tablet 1 tablet (Patient not taking: Reported on 05/23/2021)    [DISCONTINUED] Multiple  Vitamins-Minerals (AIRBORNE GUMMIES PO) Take 2 tablets by mouth daily.    No facility-administered encounter medications on file as of 05/23/2021.    Patient Active Problem List   Diagnosis Date Noted   NSTEMI (non-ST elevated myocardial infarction) ([place]) 03/05/2021   Acute renal failure superimposed on stage 4 chronic kidney disease ([name]) 08/22/2020   Hypoalbuminemia 05/25/2020   GERD (gastroesophageal reflux disease) 05/25/2020   Pressure injury of skin 05/17/2020   Type 2 diabetes mellitus with diabetic polyneuropathy, with long-term current use of insulin ([place]) 03/07/2020   Obesity, Class III, BMI 40-49.9 (morbid obesity) ([place]) 03/07/2020   Common bile duct (CBD) obstruction 05/28/2019   Benign neoplasm of ascending colon    Benign neoplasm of transverse colon    Benign neoplasm of descending colon    Benign neoplasm of sigmoid  colon    Gastric polyps    Hyperkalemia 03/11/2019   Prolonged QT interval 03/11/2019   Onychomycosis 06/21/2018   Osteomyelitis of second toe of right foot ([place])    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 (HCC)    PVD (peripheral vascular disease) ([place]) 10/26/2017   E-coli UTI 07/27/2017   Hypothyroidism 07/27/2017   PAH (pulmonary artery hypertension) (HCC)    Impaired ambulation 07/19/2017   Leg cramps 02/27/2017   Peripheral edema 01/12/2017   Diabetic neuropathy ([place]) 11/12/2016   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 mellitus ([place]) 10/24/2015   Anemia 10/03/2015   Generalized anxiety disorder 10/03/2015   Insomnia 10/03/2015   Hyperglycemia due to diabetes mellitus ([place]) 06/07/2015   Chronic diastolic CHF (congestive heart failure) ([place]) 06/07/2015   Non compliance with medical treatment 04/17/2014   Rotator cuff tear 03/14/2014   Class 3 obesity ([place]) 09/23/2013   Hypotension 12/25/2012   Urinary incontinence    MDD (major depressive disorder) 11/12/2010   RBBB (right bundle branch block)    Coronary artery disease     Hyperlipemia 01/22/2009   Essential hypertension 01/22/2009    Past Medical History:  Diagnosis Date   Acute MI ([place]) 1999; 2007   Anemia    hx   Anginal pain ([place])    Anxiety    ARF (acute renal failure) ([place]) 06/2017   [facility]   Arthritis    "generalized" (03/15/2014)   CAD (coronary artery disease)    MI in 2000 - MI  2007 - treated bare metal stent (no nuclear since then as 9/11)   Carotid artery disease ([place])    CHF (congestive heart failure) ([place])    Chronic diastolic heart failure ([place])    a) ECHO (08/2013) EF 55-60% and RV function nl b) RHC (08/2013) RA 4, RV 30/5/7, PA 25/10 (16), PCWP 7, Fick CO/CI 6.3/2.7, PVR 1.5 WU, PA 61 and 66%   Daily headache    "~ every other day; since I fell in June" (03/15/2014)   Depression    Dyslipidemia    Dyspnea    Exertional shortness of breath    HTN (hypertension)    Hypothyroidism    Neuropathy    Obesity    Osteoarthritis    Peripheral neuropathy    PONV (postoperative nausea and vomiting)    RBBB (right bundle branch block)    Old   Stroke ([place])    mini strokes   Syncope    likely due to low blood sugar   Tachycardia    Sinus tachycardia   Type II diabetes mellitus (HCC)    Type II   Urinary incontinence    Venous insufficiency     Relevant past medical, surgical, family and social history reviewed and updated as indicated. Interim medical history since our last visit reviewed.  Review of Systems  Cardiovascular:  Positive for leg swelling.  Musculoskeletal:  Positive for arthralgias.  Skin:  Positive for color change, rash and wound.  Per HPI unless specifically indicated above     Objective:    BP 118/76 (BP Location: Left Arm, Patient Position: Sitting, Cuff Size: Large)    Pulse 74    Temp (!) 97.5 F (36.4 C) (Temporal)    Resp 14    Ht 5\' 6"  (1.676 m)    Wt 270 lb (122.5 kg)    SpO2 98%    BMI 43.58 kg/m   Wt Readings from Last 3 Encounters:  05/23/21 270 lb (122.5 kg)  05/06/21 265 lb  12.8 oz (120.6 kg)  04/04/21 273 lb (123.8 kg)    Physical Exam Vitals and nursing note reviewed.  Constitutional:      General: She is not in acute distress.    Appearance: Normal appearance. She is obese. She is not toxic-appearing.  HENT:     Head: Normocephalic and atraumatic.     Right Ear: Tympanic membrane, ear canal and external ear normal.     Left Ear: Tympanic membrane, ear canal and external ear normal.     Nose: Congestion present. No rhinorrhea.     Mouth/Throat:     Mouth: Mucous membranes are moist.     Pharynx: Oropharynx is clear.  Eyes:     General: No scleral icterus.    Extraocular Movements: Extraocular movements intact.  Cardiovascular:     Rate and Rhythm: Normal rate and regular rhythm.     Heart sounds: Normal heart sounds. No murmur heard. Pulmonary:     Effort: Pulmonary effort is normal. No respiratory distress.     Breath sounds: Normal breath sounds. No wheezing, rhonchi or rales.  Musculoskeletal:     Right lower leg: Edema present.     Left lower leg: Edema present.     Comments: LLE edema > RLE edema   Skin:    General: Skin is warm and dry.     Coloration: Skin is pale. Skin is not jaundiced.     Findings: Erythema present. No rash.          Comments: 4 circular wounds noted to left lower extremity, with warmth, oozing clear drainage, foul odor, redness, and tenderness to palpation.  Neurological:     Mental Status: She is alert and oriented to person, place, and time.     Gait: Gait abnormal (walking with cane).  Psychiatric:        Mood and Affect: Mood normal.        Behavior: Behavior normal.        Thought Content: Thought content normal.        Judgment: Judgment normal.      Assessment & Plan:  1. Upper respiratory tract infection, unspecified type Acute.  Suspect viral cause.  No wheezing or chest pain today.  Viral testing obtained.  Reassured patient that symptoms and exam findings are most consistent with a viral upper  respiratory infection and explained lack of efficacy of antibiotics against viruses.  Discussed expected course and features suggestive of secondary bacterial infection.  Continue supportive care. Increase fluid intake with water or electrolyte solution like pedialyte. Encouraged acetaminophen as needed for fever/pain. Encouraged salt water gargling, chloraseptic spray and throat lozenges. Encouraged OTC guaifenesin. Encouraged saline sinus flushes and/or neti with humidified air.   - SARS-CoV-2 RNA (COVID-19) and Respiratory Viral Panel, Qualitative NAAT  2. Cellulitis of left lower extremity LLE consistent with cellulitis secondary to venous stasis ulcers.  Discussed wound care and dressings reapplied today.  Continue compression stockings.  Start doxycycline 100 mg twice daily and follow up tomorrow. Vital signs stable today in office, however patient is fragile with history of CHF and uncontrolled diabetes.  - doxycycline (VIBRA-TABS) 100 MG tablet; Take 1 tablet (100 mg total) by mouth 2 (two) times daily for 7 days.  Dispense: 14 tablet; Refill: 0  3. PVD (peripheral vascular disease) (HCC) Chronic.  Suspect venous stasis ulcers have resulted in cellulitis.  Treat as above.   4. Venous ulcer of left leg (  [place]) Multiple to left leg.  Treating for cellulitis with doxycycline, Follow up in office tomorrow.  Seek emergent care if symptoms worsen overnight.     Follow up plan: Return in about 1 day (around 05/24/2021) for cellulitis recheck.

## 2021-05-24 ENCOUNTER — Ambulatory Visit (INDEPENDENT_AMBULATORY_CARE_PROVIDER_SITE_OTHER): Payer: HMO | Admitting: Nurse Practitioner

## 2021-05-24 VITALS — BP 138/88 | HR 94 | Ht 65.0 in | Wt 264.0 lb

## 2021-05-24 DIAGNOSIS — L03116 Cellulitis of left lower limb: Secondary | ICD-10-CM

## 2021-05-24 NOTE — Progress Notes (Signed)
Subjective:    Patient ID: Susan [name], female    DOB: 03-03-1951, 70 y.o.   MRN: [id number]  HPI: Susan [name] is a 70 y.o. female presenting for wound check.   Chief Complaint  Patient presents with   Wound Check    Left lower extremity   LOWER EXTREMITY SWELLING Currently follows with heart failure clinic.  Taking torsemide 80 mg twice daily and metolazone on Wednesday.  Reports she forgot metolazone this week.  Started doxycycline yesterday and reports symptoms are stable. Also follows with Nephrology - Dr. Hollie Salk.  Onset: 3 weeks ago Location: left lower extremity Duration: week Pain: yes, sharp pain and throbbing - improved from yesterday. Severity: moderate Characteristics: throbbing Timing: comes and goes Aggravating Factors: walking on Relieving Factors: elevation, compression  Allergies  Allergen Reactions   Drug Ingredient [Cephalexin] Diarrhea    Pt reports heavy diarrhea with use of keflex   Codeine Nausea And Vomiting and Other (See Comments)    Outpatient Encounter Medications as of 05/24/2021  Medication Sig Note   acetaminophen (TYLENOL) 500 MG tablet Take 1,000 mg by mouth every 6 (six) hours as needed for moderate pain or headache.    albuterol (VENTOLIN HFA) 108 (90 Base) MCG/ACT inhaler TAKE 2 PUFFS BY MOUTH EVERY 6 HOURS AS NEEDED FOR WHEEZE OR SHORTNESS OF BREATH    allopurinol (ZYLOPRIM) 100 MG tablet Take 0.5 tablets (50 mg total) by mouth daily.    Ascorbic Acid (VITAMIN C) 1000 MG tablet Take 1,000 mg by mouth daily.    aspirin EC 81 MG tablet Take 1 tablet (81 mg total) by mouth daily with breakfast.    atorvastatin (LIPITOR) 80 MG tablet Take 1 tablet (80 mg total) by mouth daily.    carvedilol (COREG) 12.5 MG tablet TAKE 1 TABLET (12.5 MG TOTAL) BY MOUTH 2 (TWO) TIMES DAILY WITH A MEAL.    cholecalciferol (VITAMIN D) 1000 units tablet Take 1,000 Units by mouth daily with supper.     clopidogrel (PLAVIX) 75 MG tablet TAKE 1 TABLET BY MOUTH  DAILY WITH BREAKFAST.    Cranberry 250 MG TABS Take 500 mg by mouth daily.    doxycycline (VIBRA-TABS) 100 MG tablet Take 1 tablet (100 mg total) by mouth 2 (two) times daily for 7 days.    ferrous sulfate 325 (65 FE) MG tablet Take 1 tablet (325 mg total) by mouth daily with breakfast. Reported on 08/21/2015 05/23/2021: Taking twice daily   guaiFENesin-dextromethorphan (ROBITUSSIN DM) 100-10 MG/5ML syrup Take 10 mLs by mouth every 4 (four) hours as needed for cough.    hydrocortisone cream 0.5 % Apply 1 application topically 2 (two) times daily.    Insulin Disposable Pump (OMNIPOD DASH 5 PACK PODS) MISC Inject into the skin See admin instructions. Use continuously with Novolin R - change every 72 hours.    insulin NPH Human (HUMULIN N,NOVOLIN N) 100 UNIT/ML injection Inject 15 Units into the skin See admin instructions. Inject 15 units subcutaneously in the morning if CBG >300;    insulin regular (NOVOLIN R) 100 units/mL injection Inject into the skin See admin instructions. Manually add bolus to continuous dose via OmniPod 3 times daily per sliding scale (CBG 80-150 7 units, 151-200 9 units, 201-250 12 units, 251-300 14 units, 301- 400 17 units)    isosorbide mononitrate (IMDUR) 30 MG 24 hr tablet Take 0.5 tablets (15 mg total) by mouth daily.    levothyroxine (SYNTHROID, LEVOTHROID) 50 MCG tablet Take 1 tablet (50 mcg total)  by mouth daily before breakfast.    magnesium oxide (MAG-OX) 400 (240 Mg) MG tablet Take 1 tablet by mouth daily.    metolazone (ZAROXOLYN) 2.5 MG tablet Take 1 tablet (2.5 mg total) by mouth daily. 05/23/2021: Taking once weekly   Multiple Vitamins-Minerals (AIRBORNE GUMMIES PO) Take 2 tablets by mouth every evening.    Multiple Vitamins-Minerals (CENTRUM SILVER 50+WOMEN) TABS Take 1 tablet by mouth every evening.    nitroGLYCERIN (NITROSTAT) 0.4 MG SL tablet PLACE 1 TABLET (0.4 MG TOTAL) UNDER THE TONGUE EVERY 5 (FIVE) MINUTES AS NEEDED FOR CHEST PAIN.    ONETOUCH VERIO test  strip 3 (three) times daily.    polyethylene glycol (MIRALAX / GLYCOLAX) 17 g packet 1 packet mixed with 8 ounces of fluid    potassium chloride SA (KLOR-CON M20) 20 MEQ tablet Take 1 tablet (20 mEq total) by mouth 2 (two) times a week. Monday and Friday    pregabalin (LYRICA) 150 MG capsule Take 150 mg by mouth 2 (two) times daily.    sitaGLIPtin (JANUVIA) 25 MG tablet Take 1 tablet (25 mg total) by mouth daily.    torsemide (DEMADEX) 20 MG tablet Take 80 mg by mouth 2 (two) times daily.    ursodiol (ACTIGALL) 500 MG tablet Take 500 mg by mouth 3 (three) times daily.    vitamin B-12 (CYANOCOBALAMIN) 1000 MCG tablet Take 1,000 mcg by mouth daily.    vortioxetine HBr (TRINTELLIX) 10 MG TABS tablet Take 1 tablet (10 mg total) by mouth daily.    zinc gluconate 50 MG tablet Take 50 mg by mouth daily.    No facility-administered encounter medications on file as of 05/24/2021.    Patient Active Problem List   Diagnosis Date Noted   NSTEMI (non-ST elevated myocardial infarction) ([place]) 03/05/2021   Acute renal failure superimposed on stage 4 chronic kidney disease ([place]) 08/22/2020   Hypoalbuminemia 05/25/2020   GERD (gastroesophageal reflux disease) 05/25/2020   Pressure injury of skin 05/17/2020   Type 2 diabetes mellitus with diabetic polyneuropathy, with long-term current use of insulin ([place]) 03/07/2020   Obesity, Class III, BMI 40-49.9 (morbid obesity) ([place]) 03/07/2020   Common bile duct (CBD) obstruction 05/28/2019   Benign neoplasm of ascending colon    Benign neoplasm of transverse colon    Benign neoplasm of descending colon    Benign neoplasm of sigmoid colon    Gastric polyps    Hyperkalemia 03/11/2019   Prolonged QT interval 03/11/2019   Onychomycosis 06/21/2018   Osteomyelitis of second toe of right foot ([place])    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 (HCC)    PVD (peripheral vascular disease) ([place]) 10/26/2017   E-coli UTI 07/27/2017   Hypothyroidism  07/27/2017   PAH (pulmonary artery hypertension) (HCC)    Impaired ambulation 07/19/2017   Leg cramps 02/27/2017   Peripheral edema 01/12/2017   Diabetic neuropathy ([place]) 11/12/2016   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 mellitus ([place]) 10/24/2015   Anemia 10/03/2015   Generalized anxiety disorder 10/03/2015   Insomnia 10/03/2015   Hyperglycemia due to diabetes mellitus ([place]) 06/07/2015   Chronic diastolic CHF (congestive heart failure) ([place]) 06/07/2015   Non compliance with medical treatment 04/17/2014   Rotator cuff tear 03/14/2014   Class 3 obesity ([place]) 09/23/2013   Hypotension 12/25/2012   Urinary incontinence    MDD (major depressive disorder) 11/12/2010   RBBB (right bundle branch block)    Coronary artery disease    Hyperlipemia 01/22/2009  Essential hypertension 01/22/2009    Past Medical History:  Diagnosis Date   Acute MI ([place]) 1999; 2007   Anemia    hx   Anginal pain ([place])    Anxiety    ARF (acute renal failure) ([place]) 06/2017   [facility]   Arthritis    "generalized" (03/15/2014)   CAD (coronary artery disease)    MI in 2000 - MI  2007 - treated bare metal stent (no nuclear since then as 9/11)   Carotid artery disease (HCC)    CHF (congestive heart failure) ([place])    Chronic diastolic heart failure ([place])    a) ECHO (08/2013) EF 55-60% and RV function nl b) RHC (08/2013) RA 4, RV 30/5/7, PA 25/10 (16), PCWP 7, Fick CO/CI 6.3/2.7, PVR 1.5 WU, PA 61 and 66%   Daily headache    "~ every other day; since I fell in June" (03/15/2014)   Depression    Dyslipidemia    Dyspnea    Exertional shortness of breath    HTN (hypertension)    Hypothyroidism    Neuropathy    Obesity    Osteoarthritis    Peripheral neuropathy    PONV (postoperative nausea and vomiting)    RBBB (right bundle branch block)    Old   Stroke ([place])    mini strokes   Syncope    likely due to low blood sugar   Tachycardia    Sinus tachycardia   Type II diabetes mellitus (HCC)     Type II   Urinary incontinence    Venous insufficiency     Relevant past medical, surgical, family and social history reviewed and updated as indicated. Interim medical history since our last visit reviewed.  Review of Systems  Constitutional: Negative.  Negative for activity change, appetite change, chills, diaphoresis, fatigue and fever.  Gastrointestinal: Negative.  Negative for diarrhea, nausea and vomiting.  Skin:  Positive for color change and wound.  Per HPI unless specifically indicated above     Objective:    BP 138/88    Pulse 94    Ht 5\' 5"  (1.651 m)    Wt 264 lb (119.7 kg)    SpO2 95%    BMI 43.93 kg/m   Wt Readings from Last 3 Encounters:  05/24/21 264 lb (119.7 kg)  05/23/21 270 lb (122.5 kg)  05/06/21 265 lb 12.8 oz (120.6 kg)    Physical Exam Vitals and nursing note reviewed.  Constitutional:      General: She is not in acute distress.    Appearance: Normal appearance. She is obese. She is not toxic-appearing.  HENT:     Head: Normocephalic and atraumatic.  Cardiovascular:     Rate and Rhythm: Normal rate and regular rhythm.     Heart sounds: Normal heart sounds. No murmur heard. Pulmonary:     Effort: Pulmonary effort is normal. No respiratory distress.     Breath sounds: Normal breath sounds. No wheezing, rhonchi or rales.  Musculoskeletal:     Right lower leg: Edema present.     Left lower leg: Edema present.     Comments: LLE edema > RLE edema   Skin:    General: Skin is warm and dry.     Coloration: Skin is pale. Skin is not jaundiced.     Findings: Erythema present. No rash.          Comments: 4 circular wounds noted to left lower extremity.  Warmth has improved, they are slightly  smaller in size.  No odor today.  Erythema and tenderness to palpation remain present.   Neurological:     Mental Status: She is alert and oriented to person, place, and time.     Gait: Gait abnormal (walking with cane).  Psychiatric:        Mood and Affect: Mood  normal.        Behavior: Behavior normal.        Thought Content: Thought content normal.        Judgment: Judgment normal.      Assessment & Plan:  1. Cellulitis of left lower extremity Acute.  Cellulitis does not appear to be worsening, may be improving slightly.  Dressings changed today.  Continue with daily dressing changes.  Continue doxycycline 100 mg twice daily along with compression stockings.  Follow up early next week for recheck.  If symptoms worsen, go to ED.  Discussed ED precautions.     Follow up plan: Return in about 4 days (around 05/28/2021) for wound check.

## 2021-05-24 NOTE — Patient Instructions (Addendum)
Visit Information   Goals Addressed             This Visit's Progress    Set My Target A1C-Diabetes Type 2       Timeframe:  Long-Range Goal Priority:  High Start Date:   05/23/21                          Expected End Date: 11/21/21                      Follow Up Date 08/21/21    Target A1c < 8 initially due to severe elevations    Why is this important?   Your target A1C is decided together by you and your doctor.  It is based on several things like your age and other health issues.    Notes:        Patient Care Plan: Heart Failure (Adult)  Completed 04/08/2021   Problem Identified: Symptom Exacerbation (Heart Failure) Resolved 04/08/2021  Priority: High     Long-Range Goal: Symptom Exacerbation Prevented or Minimized Completed 04/08/2021  Start Date: 02/04/2021  Expected End Date: 08/04/2021  Recent Progress: Not on track  Priority: High  Note:   Resolving due to duplicate goal  Current Barriers:  Knowledge deficit related to basic heart failure pathophysiology and self care management- pt lives with spouse, pt is independent in most all aspects of her care, has walker and cane to use as needed, continues to drive. Reports does not adhere to a special diet, pt does weigh daily and record, checks BP per doctor instructions as has been elevated recently. Patient reports she has not weighed since last Friday due to not feeling like it, has been tired with cold like symptoms and elevated blood sugar, she has not contacted her doctor. Financial strain- cannot afford ursodiol (290$ per month), would like pharmacist to outreach Does not adhere to provider recommendations re: low sodium, heart healthy diet, does not exercise Case Manager Clinical Goal(s):  patient will weigh self daily and record patient will verbalize understanding of Heart Failure Action Plan and when to call doctor patient will take all Heart Failure mediations as prescribed patient will weigh daily and  record (notifying MD of 3 lb weight gain over night or 5 lb in a week) Interventions:  Collaboration with [name], MD regarding development and update of comprehensive plan of care as evidenced by provider attestation and co-signature Inter-disciplinary care team collaboration (see longitudinal plan of care) Basic overview and discussion of pathophysiology of Heart Failure reviewed  Reinforced verbal education on low sodium diet Reinforced Heart Failure Action Plan Provided education about placing scale on hard, flat surface Advised patient to weigh each morning after emptying bladder Reinforced importance of daily weight and advised patient to weigh and record daily Reviewed role of diuretics in prevention of fluid overload and management of heart failure Reviewed importance of light exercise and getting outdoors every day Reviewed all upcoming scheduled appointments Reviewed importance of contacting doctor early on for change in health status/ symtpoms Self-Care Activities: Attends all scheduled provider appointments Calls pharmacy for medication refills Patient Goals: - continue to follow rescue plan - eat more whole grains, fruits and vegetables, lean meats and healthy fats - follow rescue plan if symptoms flare-up - know when to call the doctor  - follow low sodium, heart healthy- read labels, avoid fast food and salty snacks - take all medications as prescribed -  continue to weigh daily - make an activity or exercise plan - track weight in diary - use salt in moderation - watch for swelling in feet, ankles and legs every day - weigh myself daily Follow Up Plan: Telephone follow up appointment with care management team member scheduled for:   03/18/2021    Patient Care Plan: Diabetes Type 2 (Adult)  Completed 04/08/2021   Problem Identified: Glycemic Management (Diabetes, Type 2) Resolved 04/08/2021  Priority: High     Long-Range Goal: Glycemic Management  Optimized Completed 04/08/2021  Start Date: 02/04/2021  Expected End Date: 08/04/2021  Recent Progress: Not on track  Priority: High  Note:   Resolving due to duplicate goal  Objective:  Lab Results  Component Value Date   HGBA1C 10.3 (H) 08/26/2020   Lab Results  Component Value Date   CREATININE 3.54 (H) 02/01/2021   CREATININE 3.65 (H) 11/29/2020   CREATININE 3.85 (H) 11/15/2020   No results found for: EGFR Current Barriers:  Knowledge Deficits related to basic Diabetes pathophysiology and self care/management-  Patient reports she has felt sick since last Friday with cold like symptoms, cough and elevated blood sugar, states she has not called and reported to her doctor. CBG fasting 410 today, 336 yesterday evening random ranges all above 300 with some readings 400-500's range, pt does not follow special diet.   Knowledge Deficits related to medications used for management of diabetes- needs reinforcement Financial Constraints Does not adhere to provider recommendations re:   ADA/ carbohydrate modified diet Unable to perform ADLs independently- uses cane and walker as needed Case Manager Clinical Goal(s):  patient will demonstrate improved adherence to prescribed treatment plan for diabetes self care/management as evidenced by: daily monitoring and recording of CBG  adherence to ADA/ carb modified diet adherence to prescribed medication regimen contacting provider for new or worsened symptoms or questions Interventions:  Collaboration with [name], MD regarding development and update of comprehensive plan of care as evidenced by provider attestation and co-signature Inter-disciplinary care team collaboration (see longitudinal plan of care) Provided education to patient about basic DM disease process Reviewed medications with patient and discussed importance of medication adherence Discussed plans with patient for ongoing care management follow up and provided patient with  direct contact information for care management team Provided patient with verbal educational related to hypo and hyperglycemia and importance of correct treatment Reviewed scheduled/upcoming provider appointments including: Dr. Sharol Given 11/15,  [facility]21 Reminded patient referral made to pharmacy team for assistance with medication costs - ursodiol Review of patient status, including review of consultants reports, relevant laboratory and other test results, and medications completed Reinforced ADA/ carbohydrate modified diet Encouraged pt to avoid sugary soft drinks Reviewed effects of uncontrolled diabetes on overall health Ask patient to call primary care provider's office today and make appointment to be seen due to cold like symptoms, cough and elevated blood sugar Spoke with patient again who reports she did call primary care provider office, left a voicemail and awaiting return phone call In basket sent to primary care provider with update Self-Care Activities  Self administers oral medications as prescribed Attends all scheduled provider appointments Checks blood sugars as prescribed and utilize hyper and hypoglycemia protocol as needed Adheres to prescribed ADA/carb modified Patient Goals: - continue to check blood sugar at prescribed times - check blood sugar if I feel it is too high or too low - enter blood sugar readings into a log and take to all doctors visits -  take the blood sugar meter to all doctor visits  - please practice portion control and be mindful of carbohydrate intake - please avoid sugary drinks and concentrated sweets - please call primary care provider today and make an appointment for tomorrow if possible due to having cold like symptoms and elevated blood sugar - change to whole grain breads, cereal, pasta - fill half of plate with vegetables - limit fast food meals to no more than 1 per week - manage portion size - prepare main meal at home  3 to 5 days each week - read food labels for fat, fiber, carbohydrates and portion size - switch to sugar-free drinks - check feet daily for cuts, sores or redness - keep feet up while sitting - trim toenails straight across - wash and dry feet carefully every day - wear comfortable, cotton socks - wear comfortable, well-fitting shoes Follow Up Plan: Telephone follow up appointment with care management team member scheduled for:   03/18/2021    Patient Care Plan: RN Care Manager plan of care     Problem Identified: No plan of care established for management of chronic disease states (CHF, DM2, HTN, HLD, CKD stage 4)   Priority: High     Long-Range Goal: Development of plan of care for chronic disease management (CHF, DM2, HTN, CAD, HLD, CKD stage 4)   Start Date: 04/08/2021  Expected End Date: 10/05/2021  Priority: High  Note:   Current Barriers:  Knowledge Deficits related to plan of care for management of CHF and DMII  Financial Constraints.  Pt reports today she is unable to afford trintellix 12m tablet at $100/ per month and has two tablets left, also does not have ursodiol 5074mand cannot afford. Knowledge deficit related to basic heart failure pathophysiology and self care management- pt lives with spouse, pt is independent in most all aspects of her care, has walker and cane to use as needed, continues to drive. Pt reports she forgets sometimes to weigh, weight today 270 pounds. Pt in hospital 03/05/21 for NSTEMI, pt reports she had chest pain within the past week and nitroglycerin relieved, has nitroglycerin on hand, reports she is taking medications as prescribed except for ursodiol which she cannot afford.  Patient reports she is taking new medication trintellix and this has helped with depression and she would like to continue taking, reports she is taking tradjenta and taking as prescribed.  Pt reports she has "sores on left leg" for several days now and has not reported to  the doctor, reports legs have some drainage and pt feels this is why her blood sugar has been elevated over the past 1- 2 weeks. Pt reports she is not going to ED to have her legs checked if it comes to that and she is adamant about this. Patient reports she checks blood sugar 3 x per day with ranges 200-300's range.  RNCM Clinical Goal(s):  Patient will verbalize understanding of plan for management of CHF and DMII as evidenced by review of EHR, pt report. take all medications exactly as prescribed and will call provider for medication related questions as evidenced by review of EHR, per pt report and collaboration with CCM pharmacist.    attend all scheduled medical appointments:  12/29 CCM pharmacist as evidenced by review of EHR, pt report and        through collaboration with RN Care manager, provider, and care team.   Interventions: 1:1 collaboration with primary care provider regarding development and update  of comprehensive plan of care as evidenced by provider attestation and co-signature Inter-disciplinary care team collaboration (see longitudinal plan of care) Evaluation of current treatment plan related to  self management and patient's adherence to plan as established by provider  Diabetes Interventions: Assessed patient's understanding of A1c goal: <7% Reviewed medications with patient and discussed importance of medication adherence; Counseled on importance of regular laboratory monitoring as prescribed; Reviewed scheduled/upcoming provider appointments including:  12/29 CCM pharmaicst; Review of patient status, including review of consultants reports, relevant laboratory and other test results, and medications completed; In basket sent to CCM pharmacist reporting patient is unable to afford trintellix 24m at $100/ per month and ursodiol 500 mg Reviewed carbohydrate modified diet Asked patient to verify if she is taking tradjenta 526mand pt reports she is Reviewed symptoms  hypo/hyperglycemia and actions to take Reviewed signs/ symptoms of infection and actions to take, reviewed effect on blood sugar Telephone call to primary care provider office, left voicemail for triage nurse for Dr. PiDennard Schaumannnd reported the above findings of sores on left leg with some drainage, afebrile, elevated CBG over past 1-2 weeks  In basket sent high priority to primary care provider reporting these same findings with left leg and elevated CBG and pt refuses to go to ED Lab Results  Component Value Date   HGBA1C 12.2 (H) 03/05/2021  Heart Failure Interventions: Discussed importance of daily weight and advised patient to weigh and record daily; Reviewed role of diuretics in prevention of fluid overload and management of heart failure; Provided patient with education about the role of exercise in the management of heart failure; Reviewed low sodium diet Reviewed HF action plan with emphasis on yellow zone Reinforced importance of having nitroglycerin on hand and reviewed instructions on how to take and calling 911 if no relief  Patient Goals/Self-Care Activities: Patient will self administer medications as prescribed as evidenced by self report/primary caregiver report  Patient will attend all scheduled provider appointments as evidenced by clinician review of documented attendance to scheduled appointments and patient/caregiver report Patient will call provider office for new concerns or questions as evidenced by review of documented incoming telephone call notes and patient report  Expect a call from CCM pharmacist about trintellix and ursodiol costs Call your doctor if you gain 3 pounds overnight or 5 pounds in a week Follow low sodium diet Follow carbohydrate modified diet- be mindful of carbohydrate intake at each meal If you have chest pain, use nitroglycerin x 3, if no relief, call 911 Go to emergency room if your sores on your legs worsen with redness, drainage, fever and blood  sugar continues to be elevated  Follow Up Plan:  Telephone follow up appointment with care management team member scheduled for:  05/31/2021      Patient Care Plan: General Pharmacy (Adult)     Problem Identified: CHF, HTN, NSTEMI, DM, Stage 4 CKD, GAD, Insomnia   Priority: High  Onset Date: 05/23/2021     Long-Range Goal: Patient-Specific Goal   Start Date: 05/23/2021  Expected End Date: 11/22/2021  This Visit's Progress: On track  Priority: High  Note:   Current Barriers:  Unable to achieve control of glucose   Pharmacist Clinical Goal(s):  Patient will achieve control of glucose as evidenced by improved A1c through collaboration with PharmD and provider.   Interventions: 1:1 collaboration with PiSusy FrizzleMD regarding development and update of comprehensive plan of care as evidenced by provider attestation and co-signature Inter-disciplinary care team collaboration (  see longitudinal plan of care) Comprehensive medication review performed; medication list updated in electronic medical record  Hypertension (BP goal <130/80) -Controlled -Current treatment: Carvedilol 12.62m BID Imdur 377mdaily -Medications previously tried: losartan  -Current home readings: "normal" no readings to provide -Current dietary habits: see DM -Current exercise habits: minimal at home -Denies hypotensive/hypertensive symptoms -Educated on BP goals and benefits of medications for prevention of heart attack, stroke and kidney damage; Importance of home blood pressure monitoring; Symptoms of hypotension and importance of maintaining adequate hydration; -Counseled to monitor BP at home a few times per week, document, and provide log at future appointments -Recommended to continue current medication Contact providers if BP is consistently elevated > 130/90.  Diabetes (A1c goal <7%) -Not ideally controlled -Current medications: Januvia 2541maily Humulin N 15 units am if glucose >  300 Humulin R via sliding scale -Medications previously tried: metformin  -Current home glucose readings - checking sugar 3-4 times per day Unclear when her readings were from but the majority of her readings are > 200 with on reading close to 600 and a few that were undetectable -Denies hypoglycemic/hyperglycemic symptoms -Current meal patterns:  breakfast: [place]  lunch: soups - chicken noodle, tomato, chicken and rice  dinner: chicken or pork, meatloaf, tacos in a tortilla snacks: fig bar, crackers, oranges, apples, gelatin w/ fruit? drinks: pepsi, gatorade, pedialyte, water -Current exercise: none -Educated on A1c and blood sugar goals; Complications of diabetes including kidney damage, retinal damage, and cardiovascular disease; Prevention and management of hypoglycemic episodes; Benefits of routine self-monitoring of blood sugar; Considerable time spent on counseling her on diet changes and ways she could change her current diet to bring down her glucose. -Counseled to check feet daily and get yearly eye exams -Counseled on diet and exercise extensively Will collaborate with endocrine, per patient report she is not having a basal rate through her omnipod.  She only gives the NPH insulin when her glucose is > 300 in the morning. Would recommend basal/bolus regimen for her based on severely uncontrolled glucose.  Heart Failure (Goal: manage symptoms and prevent exacerbations) -Controlled -Last ejection fraction: 55-60%  -Current treatment: Torsemide 74m49mD Carvedilol 12.5mg 76m Metolazone 2.5mg -23mce weekly -Medications previously tried: none noted  -Current home BP/HR readings: 'normal" -Current dietary habits: see DM -Current exercise habits: none -Educated on Benefits of medications for managing symptoms and prolonging life Importance of weighing daily; if you gain more than 3 pounds in one day or 5 pounds in one week, contact providers Proper diuretic  administration and potassium supplementation -Counseled on diet and exercise extensively Recommended to continue current medication  Patient Goals/Self-Care Activities Patient will:  - take medications as prescribed as evidenced by patient report and record review check glucose daily, document, and provide at future appointments collaborate with provider on medication access solutions  Follow Up Plan: The care management team will reach out to the patient again over the next 90 days.      Ms. Kristensen Porathiven information about Chronic Care Management services today including:  CCM service includes personalized support from designated clinical staff supervised by her physician, including individualized plan of care and coordination with other care providers 24/7 contact phone numbers for assistance for urgent and routine care needs. Standard insurance, coinsurance, copays and deductibles apply for chronic care management only during months in which we provide at least 20 minutes of these services. Most insurances cover these services at 100%, however patients may be responsible for any copay,  coinsurance and/or deductible if applicable. This service may help you avoid the need for more expensive face-to-face services. Only one practitioner may furnish and bill the service in a calendar month. The patient may stop CCM services at any time (effective at the end of the month) by phone call to the office staff.  Patient agreed to services and verbal consent obtained.   The patient verbalized understanding of instructions, educational materials, and care plan provided today and agreed to receive a mailed copy of patient instructions, educational materials, and care plan.  Telephone follow up appointment with pharmacy team member scheduled for: 3 months  Edythe Clarity, [place], PharmD, [place] Clinical Pharmacist Practitioner [place] [phone]

## 2021-05-25 DIAGNOSIS — I5032 Chronic diastolic (congestive) heart failure: Secondary | ICD-10-CM

## 2021-05-25 DIAGNOSIS — Z794 Long term (current) use of insulin: Secondary | ICD-10-CM | POA: Diagnosis not present

## 2021-05-25 DIAGNOSIS — E1165 Type 2 diabetes mellitus with hyperglycemia: Secondary | ICD-10-CM

## 2021-05-25 DIAGNOSIS — I10 Essential (primary) hypertension: Secondary | ICD-10-CM | POA: Diagnosis not present

## 2021-05-25 DIAGNOSIS — F339 Major depressive disorder, recurrent, unspecified: Secondary | ICD-10-CM

## 2021-05-25 DIAGNOSIS — E1142 Type 2 diabetes mellitus with diabetic polyneuropathy: Secondary | ICD-10-CM | POA: Diagnosis not present

## 2021-05-29 DIAGNOSIS — N184 Chronic kidney disease, stage 4 (severe): Secondary | ICD-10-CM | POA: Diagnosis not present

## 2021-05-29 DIAGNOSIS — D631 Anemia in chronic kidney disease: Secondary | ICD-10-CM | POA: Diagnosis not present

## 2021-05-29 DIAGNOSIS — N2581 Secondary hyperparathyroidism of renal origin: Secondary | ICD-10-CM | POA: Diagnosis not present

## 2021-05-29 DIAGNOSIS — N189 Chronic kidney disease, unspecified: Secondary | ICD-10-CM | POA: Diagnosis not present

## 2021-05-29 DIAGNOSIS — E1122 Type 2 diabetes mellitus with diabetic chronic kidney disease: Secondary | ICD-10-CM | POA: Diagnosis not present

## 2021-05-29 DIAGNOSIS — I129 Hypertensive chronic kidney disease with stage 1 through stage 4 chronic kidney disease, or unspecified chronic kidney disease: Secondary | ICD-10-CM | POA: Diagnosis not present

## 2021-05-31 ENCOUNTER — Encounter: Payer: Self-pay | Admitting: Family Medicine

## 2021-05-31 ENCOUNTER — Telehealth: Payer: HMO

## 2021-05-31 ENCOUNTER — Ambulatory Visit (INDEPENDENT_AMBULATORY_CARE_PROVIDER_SITE_OTHER): Payer: HMO | Admitting: Family Medicine

## 2021-05-31 ENCOUNTER — Other Ambulatory Visit: Payer: Self-pay

## 2021-05-31 VITALS — BP 132/68 | Ht 66.0 in | Wt 259.0 lb

## 2021-05-31 DIAGNOSIS — L97929 Non-pressure chronic ulcer of unspecified part of left lower leg with unspecified severity: Secondary | ICD-10-CM

## 2021-05-31 DIAGNOSIS — L03116 Cellulitis of left lower limb: Secondary | ICD-10-CM | POA: Diagnosis not present

## 2021-05-31 DIAGNOSIS — I83029 Varicose veins of left lower extremity with ulcer of unspecified site: Secondary | ICD-10-CM

## 2021-05-31 NOTE — Progress Notes (Signed)
Subjective:    Patient ID: Susan [name], female    DOB: 01-Mar-1951, 71 y.o.   MRN: [id number]  HPI Patient was seen by my partner on December 30.  At that appointment she was diagnosed with cellulitis and, weeping wounds on her left lower extremity.  Wounds are consistent with venous stasis ulcer.  Patient was started on doxycycline with cellulitis and was recommended to wear compression hose.  Has been wearing compression socks intermittently.  Finished doxycycline yesterday.  Presents today with continued erythema in the left lower extremity from the mid shin to the ankle.  It is not hot or tender.  It is pink.  There are 3 draining wounds each approximately 1.5 cm in diameter weeping serous fluid.  There is no fluctuance or discharge.  Wounds are diagrammed on the physical exam below.  Past Medical History:  Diagnosis Date   Acute MI ([place]) 1999; 2007   Anemia    hx   Anginal pain ([place])    Anxiety    ARF (acute renal failure) ([place]) 06/2017   [facility]   Arthritis    "generalized" (03/15/2014)   CAD (coronary artery disease)    MI in 2000 - MI  2007 - treated bare metal stent (no nuclear since then as 9/11)   Carotid artery disease (HCC)    CHF (congestive heart failure) ([place])    Chronic diastolic heart failure ([place])    a) ECHO (08/2013) EF 55-60% and RV function nl b) RHC (08/2013) RA 4, RV 30/5/7, PA 25/10 (16), PCWP 7, Fick CO/CI 6.3/2.7, PVR 1.5 WU, PA 61 and 66%   Daily headache    "~ every other day; since I fell in June" (03/15/2014)   Depression    Dyslipidemia    Dyspnea    Exertional shortness of breath    HTN (hypertension)    Hypothyroidism    Neuropathy    Obesity    Osteoarthritis    Peripheral neuropathy    PONV (postoperative nausea and vomiting)    RBBB (right bundle branch block)    Old   Stroke ([place])    mini strokes   Syncope    likely due to low blood sugar   Tachycardia    Sinus tachycardia   Type II diabetes mellitus ([place])    Type II    Urinary incontinence    Venous insufficiency    Past Surgical History:  Procedure Laterality Date   ABDOMINAL HYSTERECTOMY  1980's   AMPUTATION Right 02/24/2018   Procedure: RIGHT FOOT GREAT TOE AND 2ND TOE AMPUTATION;  Surgeon: Newt Minion, MD;  Location: [place];  Service: Orthopedics;  Laterality: Right;   AMPUTATION Right 04/30/2018   Procedure: RIGHT TRANSMETATARSAL AMPUTATION;  Surgeon: Newt Minion, MD;  Location: [place];  Service: Orthopedics;  Laterality: Right;   BIOPSY  05/27/2020   Procedure: BIOPSY;  Surgeon: [name], DO;  [facility];  Service: Endoscopy;;   CATARACT EXTRACTION, BILATERAL Bilateral ?2013   COLONOSCOPY W/ POLYPECTOMY     COLONOSCOPY WITH PROPOFOL N/A 03/13/2019   Procedure: COLONOSCOPY WITH PROPOFOL;  Surgeon: Jerene Bears, MD;  Location: [facility];  Service: Gastroenterology;  Laterality: N/A;   March ARB; 2007   "1 + 1"   ESOPHAGOGASTRODUODENOSCOPY (EGD) WITH PROPOFOL N/A 03/13/2019   Procedure: ESOPHAGOGASTRODUODENOSCOPY (EGD) WITH PROPOFOL;  Surgeon: Jerene Bears, MD;  Location: [place];  Service: Gastroenterology;  Laterality: N/A;   ESOPHAGOGASTRODUODENOSCOPY (  EGD) WITH PROPOFOL N/A 05/27/2020   Procedure: ESOPHAGOGASTRODUODENOSCOPY (EGD) WITH PROPOFOL;  Surgeon: [name], DO;  [facility];  Service: Endoscopy;  Laterality: N/A;   EYE SURGERY Bilateral    lazer   HEMOSTASIS CLIP PLACEMENT  03/13/2019   Procedure: HEMOSTASIS CLIP PLACEMENT;  Surgeon: Jerene Bears, MD;  [facility];  Service: Gastroenterology;;   KNEE ARTHROSCOPY Left 10/25/2006   POLYPECTOMY  03/13/2019   Procedure: POLYPECTOMY;  Surgeon: Jerene Bears, MD;  Location: [place];  Service: Gastroenterology;;   RIGHT HEART CATH N/A 07/24/2017   Procedure: RIGHT HEART CATH;  Surgeon: Jolaine Artist, MD;  Location: [place];  Service: Cardiovascular;  Laterality: N/A;   RIGHT  HEART CATHETERIZATION N/A 09/22/2013   Procedure: RIGHT HEART CATH;  Surgeon: Jolaine Artist, MD;  Location[facility];  Service: Cardiovascular;  Laterality: N/A;   SHOULDER ARTHROSCOPY WITH OPEN ROTATOR CUFF REPAIR Right 03/14/2014   Procedure: RIGHT SHOULDER ARTHROSCOPY WITH BICEPS RELEASE, OPEN SUBSCAPULA REPAIR, OPEN SUPRASPINATUS REPAIR.;  Surgeon: Meredith Pel, MD;  Location: [place];  Service: Orthopedics;  Laterality: Right;   TOE AMPUTATION Right 02/24/2018   GREAT TOE AND 2ND TOE AMPUTATION   TUBAL LIGATION  1970's   Current Outpatient Medications on File Prior to Visit  Medication Sig Dispense Refill   acetaminophen (TYLENOL) 500 MG tablet Take 1,000 mg by mouth every 6 (six) hours as needed for moderate pain or headache.     albuterol (VENTOLIN HFA) 108 (90 Base) MCG/ACT inhaler TAKE 2 PUFFS BY MOUTH EVERY 6 HOURS AS NEEDED FOR WHEEZE OR SHORTNESS OF BREATH 8.5 g 1   allopurinol (ZYLOPRIM) 100 MG tablet Take 0.5 tablets (50 mg total) by mouth daily. 30 tablet 0   Ascorbic Acid (VITAMIN C) 1000 MG tablet Take 1,000 mg by mouth daily.     aspirin EC 81 MG tablet Take 1 tablet (81 mg total) by mouth daily with breakfast. 30 tablet 11   atorvastatin (LIPITOR) 80 MG tablet Take 1 tablet (80 mg total) by mouth daily. 30 tablet 5   carvedilol (COREG) 12.5 MG tablet TAKE 1 TABLET (12.5 MG TOTAL) BY MOUTH 2 (TWO) TIMES DAILY WITH A MEAL. 180 tablet 1   cholecalciferol (VITAMIN D) 1000 units tablet Take 1,000 Units by mouth daily with supper.      clopidogrel (PLAVIX) 75 MG tablet TAKE 1 TABLET BY MOUTH DAILY WITH BREAKFAST. 90 tablet 3   Cranberry 250 MG TABS Take 500 mg by mouth daily.     ferrous sulfate 325 (65 FE) MG tablet Take 1 tablet (325 mg total) by mouth daily with breakfast. Reported on 08/21/2015 60 tablet 2   guaiFENesin-dextromethorphan (ROBITUSSIN DM) 100-10 MG/5ML syrup Take 10 mLs by mouth every 4 (four) hours as needed for cough. 118 mL 0   hydrocortisone cream  0.5 % Apply 1 application topically 2 (two) times daily. 30 g 0   Insulin Disposable Pump (OMNIPOD DASH 5 PACK PODS) MISC Inject into the skin See admin instructions. Use continuously with Novolin R - change every 72 hours.     insulin NPH Human (HUMULIN N,NOVOLIN N) 100 UNIT/ML injection Inject 15 Units into the skin See admin instructions. Inject 15 units subcutaneously in the morning if CBG >300;     insulin regular (NOVOLIN R) 100 units/mL injection Inject into the skin See admin instructions. Manually add bolus to continuous dose via OmniPod 3 times daily per sliding scale (CBG 80-150 7  units, 151-200 9 units, 201-250 12 units, 251-300 14 units, 301- 400 17 units)     isosorbide mononitrate (IMDUR) 30 MG 24 hr tablet Take 0.5 tablets (15 mg total) by mouth daily. 30 tablet 6   levothyroxine (SYNTHROID, LEVOTHROID) 50 MCG tablet Take 1 tablet (50 mcg total) by mouth daily before breakfast. 90 tablet 1   magnesium oxide (MAG-OX) 400 (240 Mg) MG tablet Take 1 tablet by mouth daily.     metolazone (ZAROXOLYN) 2.5 MG tablet Take 1 tablet (2.5 mg total) by mouth daily. 90 tablet 3   Multiple Vitamins-Minerals (AIRBORNE GUMMIES PO) Take 2 tablets by mouth every evening.     Multiple Vitamins-Minerals (CENTRUM SILVER 50+WOMEN) TABS Take 1 tablet by mouth every evening.     nitroGLYCERIN (NITROSTAT) 0.4 MG SL tablet PLACE 1 TABLET (0.4 MG TOTAL) UNDER THE TONGUE EVERY 5 (FIVE) MINUTES AS NEEDED FOR CHEST PAIN. 25 tablet 1   ONETOUCH VERIO test strip 3 (three) times daily.     polyethylene glycol (MIRALAX / GLYCOLAX) 17 g packet 1 packet mixed with 8 ounces of fluid     potassium chloride SA (KLOR-CON M20) 20 MEQ tablet Take 1 tablet (20 mEq total) by mouth 2 (two) times a week. Monday and Friday 30 tablet 1   pregabalin (LYRICA) 150 MG capsule Take 150 mg by mouth 2 (two) times daily.     sitaGLIPtin (JANUVIA) 25 MG tablet Take 1 tablet (25 mg total) by mouth daily. 90 tablet 3   torsemide (DEMADEX) 20  MG tablet Take 80 mg by mouth 2 (two) times daily.     ursodiol (ACTIGALL) 500 MG tablet Take 500 mg by mouth 3 (three) times daily.     vitamin B-12 (CYANOCOBALAMIN) 1000 MCG tablet Take 1,000 mcg by mouth daily.     vortioxetine HBr (TRINTELLIX) 10 MG TABS tablet Take 1 tablet (10 mg total) by mouth daily. 30 tablet 5   zinc gluconate 50 MG tablet Take 50 mg by mouth daily.     No current facility-administered medications on file prior to visit.   Allergies  Allergen Reactions   Drug Ingredient [Cephalexin] Diarrhea    Pt reports heavy diarrhea with use of keflex   Codeine Nausea And Vomiting and Other (See Comments)   Social History   Socioeconomic History   Marital status: Married    Spouse name: Not on file   Number of children: 3   Years of education: 12th   Highest education level: Not on file  Occupational History    Employer: UNEMPLOYED  Tobacco Use   Smoking status: Former    Packs/day: 3.00    Years: 32.00    Pack years: 96.00    Types: Cigarettes    Quit date: 10/24/1997    Years since quitting: 23.6   Smokeless tobacco: Never  Vaping Use   Vaping Use: Never used  Substance and Sexual Activity   Alcohol use: Not Currently    Comment: "might have 2-3 daiquiris in the summer"   Drug use: No   Sexual activity: Not Currently  Other Topics Concern   Not on file  Social History Narrative   Pt lives at home with her spouse.   Caffeine Use- 3 sodas daily   Social Determinants of Health   Financial Resource Strain: Low Risk    Difficulty of Paying Living Expenses: Not very hard  Food Insecurity: No Food Insecurity   Worried About Running Out of Food in the Last Year: Never  true   Ran Out of Food in the Last Year: Never true  Transportation Needs: No Transportation Needs   Lack of Transportation (Medical): No   Lack of Transportation (Non-Medical): No  Physical Activity: Not on file  Stress: Not on file  Social Connections: Not on file  Intimate Partner  Violence: Not on file     Review of Systems     Objective:   Physical Exam Constitutional:      General: She is not in acute distress.    Appearance: Normal appearance. She is obese. She is not ill-appearing or toxic-appearing.  Cardiovascular:     Rate and Rhythm: Normal rate and regular rhythm.     Heart sounds: Normal heart sounds. No murmur heard.   No friction rub. No gallop.  Pulmonary:     Effort: Pulmonary effort is normal. No respiratory distress.     Breath sounds: Normal breath sounds. No stridor. No wheezing, rhonchi or rales.  Abdominal:     General: Bowel sounds are normal.     Palpations: Abdomen is soft.  Musculoskeletal:     Right lower leg: 1+ Pitting Edema present.     Left lower leg: Tenderness present. 1+ Pitting Edema present.       Legs:  Skin:    Findings: Erythema present.  Neurological:     Mental Status: She is alert.          Assessment & Plan:  Venous ulcer of left leg (HCC)  Cellulitis of left lower extremity To get these wounds to heal, I feel that we need to focus on compression therapy.  I do not feel that there is active cellulitis.  There is no fever or signs of systemic illness.  I believe the erythema is more likely secondary to at this point inflammation due to the swelling and the open wounds.  Therefore I put Silvadene on nonadherent gauze and covered each of the 3 draining wounds.  I then placed the patient in an Unna boot and recommended that she return Monday or Tuesday next week for dressing change.  Continue compression therapy until healed.

## 2021-06-01 LAB — SARS-COV-2 RNA (COVID-19) RESP VIRAL PNL QL NAAT
Adenovirus B: NOT DETECTED
HUMAN PARAINFLU VIRUS 1: NOT DETECTED
HUMAN PARAINFLU VIRUS 2: NOT DETECTED
HUMAN PARAINFLU VIRUS 3: NOT DETECTED
INFLUENZA A SUBTYPE H1: NOT DETECTED
INFLUENZA A SUBTYPE H3: NOT DETECTED
Influenza A: NOT DETECTED
Influenza B: NOT DETECTED
Metapneumovirus: NOT DETECTED
Respiratory Syncytial Virus A: NOT DETECTED
Respiratory Syncytial Virus B: NOT DETECTED
Rhinovirus: NOT DETECTED
SARS CoV2 RNA: NOT DETECTED

## 2021-06-03 ENCOUNTER — Ambulatory Visit: Payer: HMO | Admitting: *Deleted

## 2021-06-03 ENCOUNTER — Telehealth: Payer: Self-pay

## 2021-06-03 DIAGNOSIS — E1165 Type 2 diabetes mellitus with hyperglycemia: Secondary | ICD-10-CM

## 2021-06-03 DIAGNOSIS — I5032 Chronic diastolic (congestive) heart failure: Secondary | ICD-10-CM

## 2021-06-03 NOTE — Telephone Encounter (Signed)
-----   Message from Eulogio Bear, NP sent at 06/03/2021  9:14 AM EST ----- Please notify respiratory panel has come back negative.  Please have her let us know if her symptoms are not improving.

## 2021-06-03 NOTE — Chronic Care Management (AMB) (Signed)
Chronic Care Management   CCM RN Visit Note  06/03/2021 Name: Susan [name] MRN: [id number] DOB: Sep 30, 1950  Subjective: Susan [name] is a 71 y.o. year old female who is a primary care patient of [name], [name], MD. The care management team was consulted for assistance with disease management and care coordination needs.    Engaged with patient by telephone for follow up visit in response to provider referral for case management and/or care coordination services.   Consent to Services:  The patient was given information about Chronic Care Management services, agreed to services, and gave verbal consent prior to initiation of services.  Please see initial visit note for detailed documentation.   Patient agreed to services and verbal consent obtained.   Assessment: Review of patient past medical history, allergies, medications, health status, including review of consultants reports, laboratory and other test data, was performed as part of comprehensive evaluation and provision of chronic care management services.   SDOH (Social Determinants of Health) assessments and interventions performed:    CCM Care Plan  Allergies  Allergen Reactions   Drug Ingredient [Cephalexin] Diarrhea    Pt reports heavy diarrhea with use of keflex   Codeine Nausea And Vomiting and Other (See Comments)    Outpatient Encounter Medications as of 06/03/2021  Medication Sig Note   acetaminophen (TYLENOL) 500 MG tablet Take 1,000 mg by mouth every 6 (six) hours as needed for moderate pain or headache.    albuterol (VENTOLIN HFA) 108 (90 Base) MCG/ACT inhaler TAKE 2 PUFFS BY MOUTH EVERY 6 HOURS AS NEEDED FOR WHEEZE OR SHORTNESS OF BREATH    allopurinol (ZYLOPRIM) 100 MG tablet Take 0.5 tablets (50 mg total) by mouth daily.    Ascorbic Acid (VITAMIN C) 1000 MG tablet Take 1,000 mg by mouth daily.    aspirin EC 81 MG tablet Take 1 tablet (81 mg total) by mouth daily with breakfast.    atorvastatin (LIPITOR) 80 MG  tablet Take 1 tablet (80 mg total) by mouth daily.    carvedilol (COREG) 12.5 MG tablet TAKE 1 TABLET (12.5 MG TOTAL) BY MOUTH 2 (TWO) TIMES DAILY WITH A MEAL.    cholecalciferol (VITAMIN D) 1000 units tablet Take 1,000 Units by mouth daily with supper.     clopidogrel (PLAVIX) 75 MG tablet TAKE 1 TABLET BY MOUTH DAILY WITH BREAKFAST.    Cranberry 250 MG TABS Take 500 mg by mouth daily.    ferrous sulfate 325 (65 FE) MG tablet Take 1 tablet (325 mg total) by mouth daily with breakfast. Reported on 08/21/2015 05/23/2021: Taking twice daily   hydrocortisone cream 0.5 % Apply 1 application topically 2 (two) times daily.    Insulin Disposable Pump (OMNIPOD DASH 5 PACK PODS) MISC Inject into the skin See admin instructions. Use continuously with Novolin R - change every 72 hours.    insulin NPH Human (HUMULIN N,NOVOLIN N) 100 UNIT/ML injection Inject 15 Units into the skin See admin instructions. Inject 15 units subcutaneously in the morning if CBG >300;    insulin regular (NOVOLIN R) 100 units/mL injection Inject into the skin See admin instructions. Manually add bolus to continuous dose via OmniPod 3 times daily per sliding scale (CBG 80-150 7 units, 151-200 9 units, 201-250 12 units, 251-300 14 units, 301- 400 17 units)    isosorbide mononitrate (IMDUR) 30 MG 24 hr tablet Take 0.5 tablets (15 mg total) by mouth daily.    levothyroxine (SYNTHROID, LEVOTHROID) 50 MCG tablet Take 1 tablet (50 mcg total)  by mouth daily before breakfast.    magnesium oxide (MAG-OX) 400 (240 Mg) MG tablet Take 1 tablet by mouth daily.    metolazone (ZAROXOLYN) 2.5 MG tablet Take 1 tablet (2.5 mg total) by mouth daily. 05/23/2021: Taking once weekly   Multiple Vitamins-Minerals (AIRBORNE GUMMIES PO) Take 2 tablets by mouth every evening.    Multiple Vitamins-Minerals (CENTRUM SILVER 50+WOMEN) TABS Take 1 tablet by mouth every evening.    nitroGLYCERIN (NITROSTAT) 0.4 MG SL tablet PLACE 1 TABLET (0.4 MG TOTAL) UNDER THE TONGUE  EVERY 5 (FIVE) MINUTES AS NEEDED FOR CHEST PAIN.    ONETOUCH VERIO test strip 3 (three) times daily.    polyethylene glycol (MIRALAX / GLYCOLAX) 17 g packet 1 packet mixed with 8 ounces of fluid    potassium chloride SA (KLOR-CON M20) 20 MEQ tablet Take 1 tablet (20 mEq total) by mouth 2 (two) times a week. Monday and Friday    pregabalin (LYRICA) 150 MG capsule Take 150 mg by mouth 2 (two) times daily.    sitaGLIPtin (JANUVIA) 25 MG tablet Take 1 tablet (25 mg total) by mouth daily.    torsemide (DEMADEX) 20 MG tablet Take 80 mg by mouth 2 (two) times daily.    vitamin B-12 (CYANOCOBALAMIN) 1000 MCG tablet Take 1,000 mcg by mouth daily.    vortioxetine HBr (TRINTELLIX) 10 MG TABS tablet Take 1 tablet (10 mg total) by mouth daily.    zinc gluconate 50 MG tablet Take 50 mg by mouth daily.    guaiFENesin-dextromethorphan (ROBITUSSIN DM) 100-10 MG/5ML syrup Take 10 mLs by mouth every 4 (four) hours as needed for cough. (Patient not taking: Reported on 06/03/2021)    ursodiol (ACTIGALL) 500 MG tablet Take 500 mg by mouth 3 (three) times daily. (Patient not taking: Reported on 06/03/2021)    No facility-administered encounter medications on file as of 06/03/2021.    Patient Active Problem List   Diagnosis Date Noted   NSTEMI (non-ST elevated myocardial infarction) ([place]) 03/05/2021   Acute renal failure superimposed on stage 4 chronic kidney disease ([place]) 08/22/2020   Hypoalbuminemia 05/25/2020   GERD (gastroesophageal reflux disease) 05/25/2020   Pressure injury of skin 05/17/2020   Type 2 diabetes mellitus with diabetic polyneuropathy, with long-term current use of insulin ([place]) 03/07/2020   Obesity, Class III, BMI 40-49.9 (morbid obesity) ([place]) 03/07/2020   Common bile duct (CBD) obstruction 05/28/2019   Benign neoplasm of ascending colon    Benign neoplasm of transverse colon    Benign neoplasm of descending colon    Benign neoplasm of sigmoid colon    Gastric polyps    Hyperkalemia 03/11/2019    Prolonged QT interval 03/11/2019   Onychomycosis 06/21/2018   Osteomyelitis of second toe of right foot ([place])    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 (HCC)    PVD (peripheral vascular disease) ([place]) 10/26/2017   E-coli UTI 07/27/2017   Hypothyroidism 07/27/2017   PAH (pulmonary artery hypertension) (HCC)    Impaired ambulation 07/19/2017   Leg cramps 02/27/2017   Peripheral edema 01/12/2017   Diabetic neuropathy ([place]) 11/12/2016   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 mellitus ([place]) 10/24/2015   Anemia 10/03/2015   Generalized anxiety disorder 10/03/2015   Insomnia 10/03/2015   Hyperglycemia due to diabetes mellitus ([place]) 06/07/2015   Chronic diastolic CHF (congestive heart failure) ([place]) 06/07/2015   Non compliance with medical treatment 04/17/2014   Rotator cuff tear 03/14/2014   Class 3 obesity ([place]) 09/23/2013  Hypotension 12/25/2012   Urinary incontinence    MDD (major depressive disorder) 11/12/2010   RBBB (right bundle branch block)    Coronary artery disease    Hyperlipemia 01/22/2009   Essential hypertension 01/22/2009    Conditions to be addressed/monitored:CHF and DMII  Care Plan : RN Care Manager plan of care  Updates made by Kassie Mends, RN since 06/03/2021 12:00 AM     Problem: No plan of care established for management of chronic disease states (CHF, DM2, HTN, HLD, CKD stage 4)   Priority: High     Long-Range Goal: Development of plan of care for chronic disease management (CHF, DM2, HTN, CAD, HLD, CKD stage 4)   Start Date: 04/08/2021  Expected End Date: 10/05/2021  Priority: High  Note:   Current Barriers:  Knowledge Deficits related to plan of care for management of CHF and DMII  Financial Constraints.  Pt reports also does not have ursodiol 500mg  and cannot afford and "not so sure I even need to take this medicine"  Pt reports her spouse does not want her to complete forms for assistance because of divulging income information,  etc. Knowledge deficit related to basic heart failure pathophysiology and self care management- pt lives with spouse, pt is independent in most all aspects of her care, has walker and cane to use as needed, continues to drive. Pt reports she forgets sometimes to weigh, weight today 265 pounds. Pt in hospital 03/05/21 for NSTEMI, has nitroglycerin on hand if needed,, reports she is taking medications as prescribed except for ursodiol which she cannot afford.  Patient reports trintellix continues to work well with alleviating symptoms of depression and she would like to continue taking.  Pt reports she sees doctor 2 x week for dressing change (unna boot) and feels legs are healing well, finished with antibiotic. Patient reports she checks blood sugar 3 x per day with today's fasting reading 365, states ranges 200-300's most everyday, pt reports she has a problem with sweets and drinks sugary colas most days and unable to go without caffeine.  RNCM Clinical Goal(s):  Patient will verbalize understanding of plan for management of CHF and DMII as evidenced by review of EHR, pt report. take all medications exactly as prescribed and will call provider for medication related questions as evidenced by review of EHR, per pt report and collaboration with CCM pharmacist.    attend all scheduled medical appointments: 06/04/21 primary care provider  as evidenced by review of EHR, pt report and        through collaboration with RN Care manager, provider, and care team.   Interventions: 1:1 collaboration with primary care provider regarding development and update of comprehensive plan of care as evidenced by provider attestation and co-signature Inter-disciplinary care team collaboration (see longitudinal plan of care) Evaluation of current treatment plan related to  self management and patient's adherence to plan as established by provider  Diabetes Interventions: Assessed patient's understanding of A1c goal:  <7% Reviewed medications with patient and discussed importance of medication adherence Reviewed scheduled/upcoming provider appointments including:  06/03/2021 primary care provider Review of patient status, including review of consultants reports, relevant laboratory and other test results, and medications completed In basket sent to CCM pharmacist reporting patient may not complete forms for assistance with medications Reinforced carbohydrate modified diet and reviewed suitable food choices Ask patient to switch to unsweetened tea with maybe lemon and importance of drinking water Reinforced symptoms hypo/hyperglycemia and actions to take Reinforced signs/ symptoms of  infection and actions to take, reviewed effect on blood sugar Lab Results  Component Value Date   HGBA1C 12.2 (H) 03/05/2021  Heart Failure Interventions: Discussed importance of daily weight and advised patient to weigh and record daily Reinforced low sodium diet Reinforced HF action plan with emphasis on yellow zone Reviewed importance of having nitroglycerin on hand and reviewed instructions on how to take and calling 911 if no relief Pain assessment completed Encouraged patient to complete forms for medication assistance Contact information for RN care manager reviewed  Patient Goals/Self-Care Activities: Patient will self administer medications as prescribed as evidenced by self report/primary caregiver report  Patient will attend all scheduled provider appointments as evidenced by clinician review of documented attendance to scheduled appointments and patient/caregiver report Patient will call provider office for new concerns or questions as evidenced by review of documented incoming telephone call notes and patient report  Call your doctor if you gain 3 pounds overnight or 5 pounds in a week Follow low sodium diet Follow carbohydrate modified diet- be mindful of carbohydrate intake at each meal Eliminate sugary soft  drinks, try unsweetened tea with lemon instead and water is always best If you have chest pain, use nitroglycerin x 3, if no relief, call 911 Go to emergency room if your sores on your legs worsen with redness, drainage, fever  Practice good handwashing Reconsider completing forms sent by pharmacist for assistance with medication Call RN care manager for any questions at [phone]  Follow Up Plan:  Telephone follow up appointment with care management team member scheduled for:  07/08/2021       Plan:Telephone follow up appointment with care management team member scheduled for:  07/08/2021  Jacqlyn Larsen [facility], BSN RN Case Manager [facility] [phone]

## 2021-06-03 NOTE — Telephone Encounter (Signed)
Patient aware of results.

## 2021-06-03 NOTE — Patient Instructions (Signed)
Visit Information  Thank you for taking time to visit with me today. Please don't hesitate to contact me if I can be of assistance to you before our next scheduled telephone appointment.  Following are the goals we discussed today:  Patient will self administer medications as prescribed as evidenced by self report/primary caregiver report  Patient will attend all scheduled provider appointments as evidenced by clinician review of documented attendance to scheduled appointments and patient/caregiver report Patient will call provider office for new concerns or questions as evidenced by review of documented incoming telephone call notes and patient report  Call your doctor if you gain 3 pounds overnight or 5 pounds in a week Follow low sodium diet Follow carbohydrate modified diet- be mindful of carbohydrate intake at each meal Eliminate sugary soft drinks, try unsweetened tea with lemon instead and water is always best If you have chest pain, use nitroglycerin x 3, if no relief, call 911 Go to emergency room if your sores on your legs worsen with redness, drainage, fever  Practice good handwashing Reconsider completing forms sent by pharmacist for assistance with medication Call RN care manager for any questions at [phone]  Our next appointment is by telephone on 07/08/2021 at 3 pm  Please call the care guide team at [phone] if you need to cancel or reschedule your appointment.   If you are experiencing a Mental Health or [place] or need someone to talk to, please call the Suicide and Crisis Lifeline: 988 call the [place] National Suicide Prevention Lifeline: 831-497-5677 or TTY: 779-297-7474 TTY (925)269-5417) to talk to a trained counselor call 1-800-273-TALK (toll free, 24 hour hotline) go to [facility] [phone]) call 911   The patient verbalized understanding of instructions, educational  materials, and care plan provided today and declined offer to receive copy of patient instructions, educational materials, and care plan.   Jacqlyn Larsen RNC, BSN RN Case Manager [facility] [phone]

## 2021-06-04 ENCOUNTER — Other Ambulatory Visit: Payer: Self-pay

## 2021-06-04 ENCOUNTER — Ambulatory Visit (INDEPENDENT_AMBULATORY_CARE_PROVIDER_SITE_OTHER): Payer: HMO | Admitting: Family Medicine

## 2021-06-04 ENCOUNTER — Encounter: Payer: Self-pay | Admitting: Family Medicine

## 2021-06-04 VITALS — BP 128/78 | HR 78 | Temp 97.7°F | Resp 18 | Ht 66.0 in | Wt 261.0 lb

## 2021-06-04 DIAGNOSIS — L97929 Non-pressure chronic ulcer of unspecified part of left lower leg with unspecified severity: Secondary | ICD-10-CM

## 2021-06-04 DIAGNOSIS — I83029 Varicose veins of left lower extremity with ulcer of unspecified site: Secondary | ICD-10-CM

## 2021-06-04 NOTE — Progress Notes (Signed)
Subjective:    Patient ID: Susan [name], female    DOB: 1950-12-05, 71 y.o.   MRN: [id number]  HPI 05/31/21 Patient was seen by my partner on December 30.  At that appointment she was diagnosed with cellulitis and, weeping wounds on her left lower extremity.  Wounds are consistent with venous stasis ulcer.  Patient was started on doxycycline with cellulitis and was recommended to wear compression hose.  Has been wearing compression socks intermittently.  Finished doxycycline yesterday.  Presents today with continued erythema in the left lower extremity from the mid shin to the ankle.  It is not hot or tender.  It is pink.  There are 3 draining wounds each approximately 1.5 cm in diameter weeping serous fluid.  There is no fluctuance or discharge.  Wounds are diagrammed on the physical exam below.  At that time, my plan was:  To get these wounds to heal, I feel that we need to focus on compression therapy.  I do not feel that there is active cellulitis.  There is no fever or signs of systemic illness.  I believe the erythema is more likely secondary to at this point inflammation due to the swelling and the open wounds.  Therefore I put Silvadene on nonadherent gauze and covered each of the 3 draining wounds.  I then placed the patient in an Unna boot and recommended that she return Monday or Tuesday next week for dressing change.  Continue compression therapy until healed.   06/04/21 Patient is here today for recheck.  The ones on her left shin have almost virtually healed.  The most superior wound has completely closed.  The 2 inferior wounds are still open slightly.  Each is open approximately 3 to 4 mm.  There is no drainage coming from them.  The erythema has faded dramatically.  The edema in her legs has essentially resolved and the left leg although the right leg is unchanged.  Past Medical History:  Diagnosis Date   Acute MI ([place]) 1999; 2007   Anemia    hx   Anginal pain ([place])    Anxiety     ARF (acute renal failure) ([place]) 06/2017   [facility] [facility]   Arthritis    "generalized" (03/15/2014)   CAD (coronary artery disease)    MI in 2000 - MI  2007 - treated bare metal stent (no nuclear since then as 9/11)   Carotid artery disease (HCC)    CHF (congestive heart failure) ([place])    Chronic diastolic heart failure ([place])    a) ECHO (08/2013) EF 55-60% and RV function nl b) RHC (08/2013) RA 4, RV 30/5/7, PA 25/10 (16), PCWP 7, Fick CO/CI 6.3/2.7, PVR 1.5 WU, PA 61 and 66%   Daily headache    "~ every other day; since I fell in June" (03/15/2014)   Depression    Dyslipidemia    Dyspnea    Exertional shortness of breath    HTN (hypertension)    Hypothyroidism    Neuropathy    Obesity    Osteoarthritis    Peripheral neuropathy    PONV (postoperative nausea and vomiting)    RBBB (right bundle branch block)    Old   Stroke ([place])    mini strokes   Syncope    likely due to low blood sugar   Tachycardia    Sinus tachycardia   Type II diabetes mellitus (HCC)    Type II   Urinary incontinence    Venous  insufficiency    Past Surgical History:  Procedure Laterality Date   ABDOMINAL HYSTERECTOMY  1980's   AMPUTATION Right 02/24/2018   Procedure: RIGHT FOOT GREAT TOE AND 2ND TOE AMPUTATION;  Surgeon: Newt Minion, MD;  Location: [facility];  Service: Orthopedics;  Laterality: Right;   AMPUTATION Right 04/30/2018   Procedure: RIGHT TRANSMETATARSAL AMPUTATION;  Surgeon: Newt Minion, MD;  Location: [facility];  Service: Orthopedics;  Laterality: Right;   BIOPSY  05/27/2020   Procedure: BIOPSY;  Surgeon: [name], DO;  [facility];  Service: Endoscopy;;   CATARACT EXTRACTION, BILATERAL Bilateral ?2013   COLONOSCOPY W/ POLYPECTOMY     COLONOSCOPY WITH PROPOFOL N/A 03/13/2019   Procedure: COLONOSCOPY WITH PROPOFOL;  Surgeon: Jerene Bears, MD;  Location: [place];  Service: Gastroenterology;  Laterality: N/A;   [place]; 2007    "1 + 1"   ESOPHAGOGASTRODUODENOSCOPY (EGD) WITH PROPOFOL N/A 03/13/2019   Procedure: ESOPHAGOGASTRODUODENOSCOPY (EGD) WITH PROPOFOL;  Surgeon: Jerene Bears, MD;  Location: [facility];  Service: Gastroenterology;  Laterality: N/A;   ESOPHAGOGASTRODUODENOSCOPY (EGD) WITH PROPOFOL N/A 05/27/2020   Procedure: ESOPHAGOGASTRODUODENOSCOPY (EGD) WITH PROPOFOL;  Surgeon: [name], DO;  [facility];  Service: Endoscopy;  Laterality: N/A;   EYE SURGERY Bilateral    lazer   HEMOSTASIS CLIP PLACEMENT  03/13/2019   Procedure: HEMOSTASIS CLIP PLACEMENT;  Surgeon: Jerene Bears, MD;  Location[facility];  Service: Gastroenterology;;   KNEE ARTHROSCOPY Left 10/25/2006   POLYPECTOMY  03/13/2019   Procedure: POLYPECTOMY;  Surgeon: Jerene Bears, MD;  Location: [place];  Service: Gastroenterology;;   RIGHT HEART CATH N/A 07/24/2017   Procedure: RIGHT HEART CATH;  Surgeon: Jolaine Artist, MD;  Location: [facility];  Service: Cardiovascular;  Laterality: N/A;   RIGHT HEART CATHETERIZATION N/A 09/22/2013   Procedure: RIGHT HEART CATH;  Surgeon: Jolaine Artist, MD;  Location: [facility];  Service: Cardiovascular;  Laterality: N/A;   SHOULDER ARTHROSCOPY WITH OPEN ROTATOR CUFF REPAIR Right 03/14/2014   Procedure: RIGHT SHOULDER ARTHROSCOPY WITH BICEPS RELEASE, OPEN SUBSCAPULA REPAIR, OPEN SUPRASPINATUS REPAIR.;  Surgeon: Meredith Pel, MD;  Location: [facility];  Service: Orthopedics;  Laterality: Right;   TOE AMPUTATION Right 02/24/2018   GREAT TOE AND 2ND TOE AMPUTATION   TUBAL LIGATION  1970's   Current Outpatient Medications on File Prior to Visit  Medication Sig Dispense Refill   acetaminophen (TYLENOL) 500 MG tablet Take 1,000 mg by mouth every 6 (six) hours as needed for moderate pain or headache.     albuterol (VENTOLIN HFA) 108 (90 Base) MCG/ACT inhaler TAKE 2 PUFFS BY MOUTH EVERY 6 HOURS AS NEEDED FOR WHEEZE OR SHORTNESS OF BREATH 8.5 g 1   allopurinol  (ZYLOPRIM) 100 MG tablet Take 0.5 tablets (50 mg total) by mouth daily. 30 tablet 0   Ascorbic Acid (VITAMIN C) 1000 MG tablet Take 1,000 mg by mouth daily.     aspirin EC 81 MG tablet Take 1 tablet (81 mg total) by mouth daily with breakfast. 30 tablet 11   atorvastatin (LIPITOR) 80 MG tablet Take 1 tablet (80 mg total) by mouth daily. 30 tablet 5   carvedilol (COREG) 12.5 MG tablet TAKE 1 TABLET (12.5 MG TOTAL) BY MOUTH 2 (TWO) TIMES DAILY WITH A MEAL. 180 tablet 1   cholecalciferol (VITAMIN D) 1000 units tablet Take 1,000 Units by mouth daily with supper.      clopidogrel (  PLAVIX) 75 MG tablet TAKE 1 TABLET BY MOUTH DAILY WITH BREAKFAST. 90 tablet 3   Cranberry 250 MG TABS Take 500 mg by mouth daily.     ferrous sulfate 325 (65 FE) MG tablet Take 1 tablet (325 mg total) by mouth daily with breakfast. Reported on 08/21/2015 60 tablet 2   guaiFENesin-dextromethorphan (ROBITUSSIN DM) 100-10 MG/5ML syrup Take 10 mLs by mouth every 4 (four) hours as needed for cough. 118 mL 0   hydrocortisone cream 0.5 % Apply 1 application topically 2 (two) times daily. 30 g 0   Insulin Disposable Pump (OMNIPOD DASH 5 PACK PODS) MISC Inject into the skin See admin instructions. Use continuously with Novolin R - change every 72 hours.     insulin NPH Human (HUMULIN N,NOVOLIN N) 100 UNIT/ML injection Inject 15 Units into the skin See admin instructions. Inject 15 units subcutaneously in the morning if CBG >300;     insulin regular (NOVOLIN R) 100 units/mL injection Inject into the skin See admin instructions. Manually add bolus to continuous dose via OmniPod 3 times daily per sliding scale (CBG 80-150 7 units, 151-200 9 units, 201-250 12 units, 251-300 14 units, 301- 400 17 units)     isosorbide mononitrate (IMDUR) 30 MG 24 hr tablet Take 0.5 tablets (15 mg total) by mouth daily. 30 tablet 6   levothyroxine (SYNTHROID, LEVOTHROID) 50 MCG tablet Take 1 tablet (50 mcg total) by mouth daily before breakfast. 90 tablet 1    magnesium oxide (MAG-OX) 400 (240 Mg) MG tablet Take 1 tablet by mouth daily.     metolazone (ZAROXOLYN) 2.5 MG tablet Take 1 tablet (2.5 mg total) by mouth daily. 90 tablet 3   Multiple Vitamins-Minerals (AIRBORNE GUMMIES PO) Take 2 tablets by mouth every evening.     Multiple Vitamins-Minerals (CENTRUM SILVER 50+WOMEN) TABS Take 1 tablet by mouth every evening.     nitroGLYCERIN (NITROSTAT) 0.4 MG SL tablet PLACE 1 TABLET (0.4 MG TOTAL) UNDER THE TONGUE EVERY 5 (FIVE) MINUTES AS NEEDED FOR CHEST PAIN. 25 tablet 1   ONETOUCH VERIO test strip 3 (three) times daily.     polyethylene glycol (MIRALAX / GLYCOLAX) 17 g packet 1 packet mixed with 8 ounces of fluid     potassium chloride SA (KLOR-CON M20) 20 MEQ tablet Take 1 tablet (20 mEq total) by mouth 2 (two) times a week. Monday and Friday 30 tablet 1   pregabalin (LYRICA) 150 MG capsule Take 150 mg by mouth 2 (two) times daily.     sitaGLIPtin (JANUVIA) 25 MG tablet Take 1 tablet (25 mg total) by mouth daily. 90 tablet 3   torsemide (DEMADEX) 20 MG tablet Take 80 mg by mouth 2 (two) times daily.     ursodiol (ACTIGALL) 500 MG tablet Take 500 mg by mouth 3 (three) times daily.     vitamin B-12 (CYANOCOBALAMIN) 1000 MCG tablet Take 1,000 mcg by mouth daily.     vortioxetine HBr (TRINTELLIX) 10 MG TABS tablet Take 1 tablet (10 mg total) by mouth daily. 30 tablet 5   zinc gluconate 50 MG tablet Take 50 mg by mouth daily.     No current facility-administered medications on file prior to visit.   Allergies  Allergen Reactions   Drug Ingredient [Cephalexin] Diarrhea    Pt reports heavy diarrhea with use of keflex   Codeine Nausea And Vomiting and Other (See Comments)   Social History   Socioeconomic History   Marital status: Married    Spouse name: Not  on file   Number of children: 3   Years of education: 12th   Highest education level: Not on file  Occupational History    Employer: UNEMPLOYED  Tobacco Use   Smoking status: Former     Packs/day: 3.00    Years: 32.00    Pack years: 96.00    Types: Cigarettes    Quit date: 10/24/1997    Years since quitting: 23.6   Smokeless tobacco: Never  Vaping Use   Vaping Use: Never used  Substance and Sexual Activity   Alcohol use: Not Currently    Comment: "might have 2-3 daiquiris in the summer"   Drug use: No   Sexual activity: Not Currently  Other Topics Concern   Not on file  Social History Narrative   Pt lives at home with her spouse.   Caffeine Use- 3 sodas daily   Social Determinants of Health   Financial Resource Strain: Low Risk    Difficulty of Paying Living Expenses: Not very hard  Food Insecurity: No Food Insecurity   Worried About Charity fundraiser in the Last Year: Never true   Ran Out of Food in the Last Year: Never true  Transportation Needs: No Transportation Needs   Lack of Transportation (Medical): No   Lack of Transportation (Non-Medical): No  Physical Activity: Not on file  Stress: Not on file  Social Connections: Not on file  Intimate Partner Violence: Not on file     Review of Systems     Objective:   Physical Exam Constitutional:      General: She is not in acute distress.    Appearance: Normal appearance. She is obese. She is not ill-appearing or toxic-appearing.  Cardiovascular:     Rate and Rhythm: Normal rate and regular rhythm.     Heart sounds: Normal heart sounds. No murmur heard.   No friction rub. No gallop.  Pulmonary:     Effort: Pulmonary effort is normal. No respiratory distress.     Breath sounds: Normal breath sounds. No stridor. No wheezing, rhonchi or rales.  Abdominal:     General: Bowel sounds are normal.     Palpations: Abdomen is soft.  Musculoskeletal:     Right lower leg: Edema present.     Left lower leg: No tenderness. No edema.  Skin:    Findings: No erythema.  Neurological:     Mental Status: She is alert.    There are only 2 punctate wounds on the left lower extremity.  Each is approximately 3  mm in diameter.  There is no drainage.  This is markedly improved from the 1.5 cm shallow ulcers I saw at the last visit.  The edema has resolved.  The erythema has essentially resolved      Assessment & Plan:  Venous ulcer of left leg (HCC) I replaced the Unna boot on the left leg to allow complete and total resolution.  Patient will return on Friday for dressing removal.  At that point, I do not feel that the patient requires any additional compression therapy.

## 2021-06-07 ENCOUNTER — Other Ambulatory Visit: Payer: Self-pay

## 2021-06-07 ENCOUNTER — Ambulatory Visit (INDEPENDENT_AMBULATORY_CARE_PROVIDER_SITE_OTHER): Payer: HMO | Admitting: Family Medicine

## 2021-06-07 ENCOUNTER — Encounter: Payer: Self-pay | Admitting: Family Medicine

## 2021-06-07 VITALS — BP 118/60 | HR 75 | Temp 97.6°F | Resp 16 | Ht 66.0 in | Wt 260.0 lb

## 2021-06-07 DIAGNOSIS — L97929 Non-pressure chronic ulcer of unspecified part of left lower leg with unspecified severity: Secondary | ICD-10-CM | POA: Diagnosis not present

## 2021-06-07 DIAGNOSIS — I83029 Varicose veins of left lower extremity with ulcer of unspecified site: Secondary | ICD-10-CM

## 2021-06-07 NOTE — Progress Notes (Signed)
Subjective:    Patient ID: Susan [name], female    DOB: 1950/12/28, 71 y.o.   MRN: [id number]  HPI 05/31/21 Patient was seen by my partner on December 30.  At that appointment she was diagnosed with cellulitis and, weeping wounds on her left lower extremity.  Wounds are consistent with venous stasis ulcer.  Patient was started on doxycycline with cellulitis and was recommended to wear compression hose.  Has been wearing compression socks intermittently.  Finished doxycycline yesterday.  Presents today with continued erythema in the left lower extremity from the mid shin to the ankle.  It is not hot or tender.  It is pink.  There are 3 draining wounds each approximately 1.5 cm in diameter weeping serous fluid.  There is no fluctuance or discharge.  Wounds are diagrammed on the physical exam below.  At that time, my plan was:  To get these wounds to heal, I feel that we need to focus on compression therapy.  I do not feel that there is active cellulitis.  There is no fever or signs of systemic illness.  I believe the erythema is more likely secondary to at this point inflammation due to the swelling and the open wounds.  Therefore I put Silvadene on nonadherent gauze and covered each of the 3 draining wounds.  I then placed the patient in an Unna boot and recommended that she return Monday or Tuesday next week for dressing change.  Continue compression therapy until healed.   06/04/21 Patient is here today for recheck.  The ones on her left shin have almost virtually healed.  The most superior wound has completely closed.  The 2 inferior wounds are still open slightly.  Each is open approximately 3 to 4 mm.  There is no drainage coming from them.  The erythema has faded dramatically.  The edema in her legs has essentially resolved and the left leg although the right leg is unchanged.  At that time, my plan was: I replaced the Unna boot on the left leg to allow complete and total resolution.  Patient will  return on Friday for dressing removal.  At that point, I do not feel that the patient requires any additional compression therapy.  06/07/21 Wounds on the left leg with healed.  The swelling is completely resolved and the area that was wrapped with Unna boot.  The erythema has resolved and now there is chronic hyperpigmentation of the skin due to chronic leg swelling but no evidence of cellulitis.  Unfortunately patient has a very superficial skin tear on the anterior right leg.  There is no weeping.  This is approximately 1 cm x 3 cm.  Past Medical History:  Diagnosis Date   Acute MI ([place]) 1999; 2007   Anemia    hx   Anginal pain ([place])    Anxiety    ARF (acute renal failure) ([place]) 06/2017   [facility]   Arthritis    "generalized" (03/15/2014)   CAD (coronary artery disease)    MI in 2000 - MI  2007 - treated bare metal stent (no nuclear since then as 9/11)   Carotid artery disease (HCC)    CHF (congestive heart failure) (HCC)    Chronic diastolic heart failure ([place])    a) ECHO (08/2013) EF 55-60% and RV function nl b) RHC (08/2013) RA 4, RV 30/5/7, PA 25/10 (16), PCWP 7, Fick CO/CI 6.3/2.7, PVR 1.5 WU, PA 61 and 66%   Daily headache    "~  every other day; since I fell in June" (03/15/2014)   Depression    Dyslipidemia    Dyspnea    Exertional shortness of breath    HTN (hypertension)    Hypothyroidism    Neuropathy    Obesity    Osteoarthritis    Peripheral neuropathy    PONV (postoperative nausea and vomiting)    RBBB (right bundle branch block)    Old   Stroke ([place])    mini strokes   Syncope    likely due to low blood sugar   Tachycardia    Sinus tachycardia   Type II diabetes mellitus ([place])    Type II   Urinary incontinence    Venous insufficiency    Past Surgical History:  Procedure Laterality Date   ABDOMINAL HYSTERECTOMY  1980's   AMPUTATION Right 02/24/2018   Procedure: RIGHT FOOT GREAT TOE AND 2ND TOE AMPUTATION;  Surgeon: Newt Minion, MD;   Location: [facility];  Service: Orthopedics;  Laterality: Right;   AMPUTATION Right 04/30/2018   Procedure: RIGHT TRANSMETATARSAL AMPUTATION;  Surgeon: Newt Minion, MD;  Location: [facility];  Service: Orthopedics;  Laterality: Right;   BIOPSY  05/27/2020   Procedure: BIOPSY;  Surgeon: [name], DO;  [facility];  Service: Endoscopy;;   CATARACT EXTRACTION, BILATERAL Bilateral ?2013   COLONOSCOPY W/ POLYPECTOMY     COLONOSCOPY WITH PROPOFOL N/A 03/13/2019   Procedure: COLONOSCOPY WITH PROPOFOL;  Surgeon: Jerene Bears, MD;  Location: [facility];  Service: Gastroenterology;  Laterality: N/A;   [place]; 2007   "1 + 1"   ESOPHAGOGASTRODUODENOSCOPY (EGD) WITH PROPOFOL N/A 03/13/2019   Procedure: ESOPHAGOGASTRODUODENOSCOPY (EGD) WITH PROPOFOL;  Surgeon: Jerene Bears, MD;  Location: [facility] [place];  Service: Gastroenterology;  Laterality: N/A;   ESOPHAGOGASTRODUODENOSCOPY (EGD) WITH PROPOFOL N/A 05/27/2020   Procedure: ESOPHAGOGASTRODUODENOSCOPY (EGD) WITH PROPOFOL;  Surgeon: [name], DO;  [facility];  Service: Endoscopy;  Laterality: N/A;   EYE SURGERY Bilateral    lazer   HEMOSTASIS CLIP PLACEMENT  03/13/2019   Procedure: HEMOSTASIS CLIP PLACEMENT;  Surgeon: Jerene Bears, MD;  Location: [facility] END[facility];  Service: Gastroenterology;;   KNEE ARTHROSCOPY Left 10/25/2006   POLYPECTOMY  03/13/2019   Procedure: POLYPECTOMY;  Surgeon: Jerene Bears, MD;  Location: [facility];  Service: Gastroenterology;;   RIGHT HEART CATH N/A 07/24/2017   Procedure: RIGHT HEART CATH;  Surgeon: Jolaine Artist, MD;  Location: [facility];  Service: Cardiovascular;  Laterality: N/A;   RIGHT HEART CATHETERIZATION N/A 09/22/2013   Procedure: RIGHT HEART CATH;  Surgeon: Jolaine Artist, MD;  Location: [facility];  Service: Cardiovascular;  Laterality: N/A;   SHOULDER ARTHROSCOPY WITH OPEN ROTATOR CUFF REPAIR Right 03/14/2014   Procedure:  RIGHT SHOULDER ARTHROSCOPY WITH BICEPS RELEASE, OPEN SUBSCAPULA REPAIR, OPEN SUPRASPINATUS REPAIR.;  Surgeon: Meredith Pel, MD;  Location: [facility];  Service: Orthopedics;  Laterality: Right;   TOE AMPUTATION Right 02/24/2018   GREAT TOE AND 2ND TOE AMPUTATION   TUBAL LIGATION  1970's   Current Outpatient Medications on File Prior to Visit  Medication Sig Dispense Refill   acetaminophen (TYLENOL) 500 MG tablet Take 1,000 mg by mouth every 6 (six) hours as needed for moderate pain or headache.     albuterol (VENTOLIN HFA) 108 (90 Base) MCG/ACT inhaler TAKE 2 PUFFS BY MOUTH EVERY 6 HOURS AS NEEDED FOR WHEEZE OR SHORTNESS OF BREATH 8.5 g 1  allopurinol (ZYLOPRIM) 100 MG tablet Take 0.5 tablets (50 mg total) by mouth daily. 30 tablet 0   Ascorbic Acid (VITAMIN C) 1000 MG tablet Take 1,000 mg by mouth daily.     aspirin EC 81 MG tablet Take 1 tablet (81 mg total) by mouth daily with breakfast. 30 tablet 11   atorvastatin (LIPITOR) 80 MG tablet Take 1 tablet (80 mg total) by mouth daily. 30 tablet 5   carvedilol (COREG) 12.5 MG tablet TAKE 1 TABLET (12.5 MG TOTAL) BY MOUTH 2 (TWO) TIMES DAILY WITH A MEAL. 180 tablet 1   cholecalciferol (VITAMIN D) 1000 units tablet Take 1,000 Units by mouth daily with supper.      clopidogrel (PLAVIX) 75 MG tablet TAKE 1 TABLET BY MOUTH DAILY WITH BREAKFAST. 90 tablet 3   Cranberry 250 MG TABS Take 500 mg by mouth daily.     ferrous sulfate 325 (65 FE) MG tablet Take 1 tablet (325 mg total) by mouth daily with breakfast. Reported on 08/21/2015 60 tablet 2   guaiFENesin-dextromethorphan (ROBITUSSIN DM) 100-10 MG/5ML syrup Take 10 mLs by mouth every 4 (four) hours as needed for cough. 118 mL 0   hydrocortisone cream 0.5 % Apply 1 application topically 2 (two) times daily. 30 g 0   Insulin Disposable Pump (OMNIPOD DASH 5 PACK PODS) MISC Inject into the skin See admin instructions. Use continuously with Novolin R - change every 72 hours.     insulin NPH Human (HUMULIN  N,NOVOLIN N) 100 UNIT/ML injection Inject 15 Units into the skin See admin instructions. Inject 15 units subcutaneously in the morning if CBG >300;     insulin regular (NOVOLIN R) 100 units/mL injection Inject into the skin See admin instructions. Manually add bolus to continuous dose via OmniPod 3 times daily per sliding scale (CBG 80-150 7 units, 151-200 9 units, 201-250 12 units, 251-300 14 units, 301- 400 17 units)     isosorbide mononitrate (IMDUR) 30 MG 24 hr tablet Take 0.5 tablets (15 mg total) by mouth daily. 30 tablet 6   levothyroxine (SYNTHROID, LEVOTHROID) 50 MCG tablet Take 1 tablet (50 mcg total) by mouth daily before breakfast. 90 tablet 1   magnesium oxide (MAG-OX) 400 (240 Mg) MG tablet Take 1 tablet by mouth daily.     metolazone (ZAROXOLYN) 2.5 MG tablet Take 1 tablet (2.5 mg total) by mouth daily. 90 tablet 3   Multiple Vitamins-Minerals (AIRBORNE GUMMIES PO) Take 2 tablets by mouth every evening.     Multiple Vitamins-Minerals (CENTRUM SILVER 50+WOMEN) TABS Take 1 tablet by mouth every evening.     nitroGLYCERIN (NITROSTAT) 0.4 MG SL tablet PLACE 1 TABLET (0.4 MG TOTAL) UNDER THE TONGUE EVERY 5 (FIVE) MINUTES AS NEEDED FOR CHEST PAIN. 25 tablet 1   ONETOUCH VERIO test strip 3 (three) times daily.     polyethylene glycol (MIRALAX / GLYCOLAX) 17 g packet 1 packet mixed with 8 ounces of fluid     potassium chloride SA (KLOR-CON M20) 20 MEQ tablet Take 1 tablet (20 mEq total) by mouth 2 (two) times a week. Monday and Friday 30 tablet 1   pregabalin (LYRICA) 150 MG capsule Take 150 mg by mouth 2 (two) times daily.     sitaGLIPtin (JANUVIA) 25 MG tablet Take 1 tablet (25 mg total) by mouth daily. 90 tablet 3   torsemide (DEMADEX) 20 MG tablet Take 80 mg by mouth 2 (two) times daily.     ursodiol (ACTIGALL) 500 MG tablet Take 500 mg by mouth  3 (three) times daily.     vitamin B-12 (CYANOCOBALAMIN) 1000 MCG tablet Take 1,000 mcg by mouth daily.     vortioxetine HBr (TRINTELLIX) 10 MG  TABS tablet Take 1 tablet (10 mg total) by mouth daily. 30 tablet 5   zinc gluconate 50 MG tablet Take 50 mg by mouth daily.     No current facility-administered medications on file prior to visit.   Allergies  Allergen Reactions   Drug Ingredient [Cephalexin] Diarrhea    Pt reports heavy diarrhea with use of keflex   Codeine Nausea And Vomiting and Other (See Comments)   Social History   Socioeconomic History   Marital status: Married    Spouse name: Not on file   Number of children: 3   Years of education: 12th   Highest education level: Not on file  Occupational History    Employer: UNEMPLOYED  Tobacco Use   Smoking status: Former    Packs/day: 3.00    Years: 32.00    Pack years: 96.00    Types: Cigarettes    Quit date: 10/24/1997    Years since quitting: 23.6   Smokeless tobacco: Never  Vaping Use   Vaping Use: Never used  Substance and Sexual Activity   Alcohol use: Not Currently    Comment: "might have 2-3 daiquiris in the summer"   Drug use: No   Sexual activity: Not Currently  Other Topics Concern   Not on file  Social History Narrative   Pt lives at home with her spouse.   Caffeine Use- 3 sodas daily   Social Determinants of Health   Financial Resource Strain: Low Risk    Difficulty of Paying Living Expenses: Not very hard  Food Insecurity: No Food Insecurity   Worried About Charity fundraiser in the Last Year: Never true   Ran Out of Food in the Last Year: Never true  Transportation Needs: No Transportation Needs   Lack of Transportation (Medical): No   Lack of Transportation (Non-Medical): No  Physical Activity: Not on file  Stress: Not on file  Social Connections: Not on file  Intimate Partner Violence: Not on file     Review of Systems     Objective:   Physical Exam Constitutional:      General: She is not in acute distress.    Appearance: Normal appearance. She is obese. She is not ill-appearing or toxic-appearing.  Cardiovascular:      Rate and Rhythm: Normal rate and regular rhythm.     Heart sounds: Normal heart sounds. No murmur heard.   No friction rub. No gallop.  Pulmonary:     Effort: Pulmonary effort is normal. No respiratory distress.     Breath sounds: Normal breath sounds. No stridor. No wheezing, rhonchi or rales.  Abdominal:     General: Bowel sounds are normal.     Palpations: Abdomen is soft.  Musculoskeletal:     Right lower leg: Edema present.     Left lower leg: No tenderness. No edema.       Legs:  Skin:    Findings: No erythema.  Neurological:     Mental Status: She is alert.  Wounds have healed on the left lower extremity.  There is edema in the right lower extremity with a 1 cm x 3 cm very superficial skin tear due to the swelling.  Cover this with a bandage and recommended the patient wear her compression hose and keep the leg elevated  Assessment & Plan:  Venous ulcer of left leg (HCC) Wounds on the left leg have resolved.  Discontinue Unna boot therapy.  Patient can start wearing her compression hose again.  Very shallow less than 1 mm partial skin tear that does not extend through the dermis recommended that the patient apply bandage this daily, wear compression hose, keep legs elevated.

## 2021-06-11 ENCOUNTER — Telehealth: Payer: Self-pay

## 2021-06-11 DIAGNOSIS — R197 Diarrhea, unspecified: Secondary | ICD-10-CM

## 2021-06-11 DIAGNOSIS — E1165 Type 2 diabetes mellitus with hyperglycemia: Secondary | ICD-10-CM | POA: Diagnosis not present

## 2021-06-11 NOTE — Telephone Encounter (Signed)
Pt called to report she has had diarrhea for over 2 weeks now. Pt has tried probiotics and Imodium AD with no relief. Pt would like to know what else you would recommend.  Please advise, thanks!

## 2021-06-12 ENCOUNTER — Telehealth: Payer: Self-pay | Admitting: Pharmacist

## 2021-06-12 NOTE — Progress Notes (Addendum)
Chronic Care Management Pharmacy Assistant   Name: Susan [name]  MRN: [id number] DOB: 02/05/1951   Reason for Encounter: Disease State - Diabetes Call    Recent office visits:  06/07/20 Susan Luo, MD - Family Medicine - Venous Ulcer of left lower extremity - Silvadene on nonadherent gauze and covered each of the 3 draining wounds.  I then placed the patient in an Unna boot and recommended that she return Monday or Tuesday next week for dressing change.  Continue compression therapy until healed.   05/24/21 Susan Spanner, NP - Cellulitis of left lower extremity - Dressings changed today.  Continue with daily dressing changes. Continue doxycycline 100 mg twice daily along with compression stockings.  Follow up early next week for recheck.  If symptoms worsen, go to ED.  Discussed ED precautions.    Recent consult visits:  None noted.  Hospital visits: 03/05/21 - 03/10/21 Medication Reconciliation was completed by comparing discharge summary, patients EMR and Pharmacy list, and upon discussion with patient.  Admitted to the hospital on 03/05/21 due to NSTEMI. Discharge date was 03/10/21. Discharged from [facility]?Medications Started at [facility] Discharge:?? atorvastatin (LIPITOR) fosfomycin (MONUROL) hydrALAZINE (APRESOLINE) hydrocortisone cream isosorbide mononitrate (IMDUR) linagliptin (TRADJENTA)   Medication Changes at Hospital Discharge: allopurinol (Zyloprim) ferrous sulfate magnesium oxide (MAG-OX)  Medications Discontinued at Hospital Discharge: cephALEXin 250 MG capsule (KEFLEX) pregabalin 150 MG capsule (Lyrica) traZODone 100 MG tablet (DESYREL)  Medications that remain the same after Hospital Discharge:??  All other medications will remain the same.    Medications: Outpatient Encounter Medications as of 06/12/2021  Medication Sig Note   acetaminophen (TYLENOL) 500 MG tablet Take 1,000 mg by mouth every 6 (six) hours as needed for  moderate pain or headache.    albuterol (VENTOLIN HFA) 108 (90 Base) MCG/ACT inhaler TAKE 2 PUFFS BY MOUTH EVERY 6 HOURS AS NEEDED FOR WHEEZE OR SHORTNESS OF BREATH    allopurinol (ZYLOPRIM) 100 MG tablet Take 0.5 tablets (50 mg total) by mouth daily.    Ascorbic Acid (VITAMIN C) 1000 MG tablet Take 1,000 mg by mouth daily.    aspirin EC 81 MG tablet Take 1 tablet (81 mg total) by mouth daily with breakfast.    atorvastatin (LIPITOR) 80 MG tablet Take 1 tablet (80 mg total) by mouth daily.    carvedilol (COREG) 12.5 MG tablet TAKE 1 TABLET (12.5 MG TOTAL) BY MOUTH 2 (TWO) TIMES DAILY WITH A MEAL.    cholecalciferol (VITAMIN D) 1000 units tablet Take 1,000 Units by mouth daily with supper.     clopidogrel (PLAVIX) 75 MG tablet TAKE 1 TABLET BY MOUTH DAILY WITH BREAKFAST.    Cranberry 250 MG TABS Take 500 mg by mouth daily.    ferrous sulfate 325 (65 FE) MG tablet Take 1 tablet (325 mg total) by mouth daily with breakfast. Reported on 08/21/2015 05/23/2021: Taking twice daily   guaiFENesin-dextromethorphan (ROBITUSSIN DM) 100-10 MG/5ML syrup Take 10 mLs by mouth every 4 (four) hours as needed for cough.    hydrocortisone cream 0.5 % Apply 1 application topically 2 (two) times daily.    Insulin Disposable Pump (OMNIPOD DASH 5 PACK PODS) MISC Inject into the skin See admin instructions. Use continuously with Novolin R - change every 72 hours.    insulin NPH Human (HUMULIN N,NOVOLIN N) 100 UNIT/ML injection Inject 15 Units into the skin See admin instructions. Inject 15 units subcutaneously in the morning if CBG >300;    insulin regular (NOVOLIN  R) 100 units/mL injection Inject into the skin See admin instructions. Manually add bolus to continuous dose via OmniPod 3 times daily per sliding scale (CBG 80-150 7 units, 151-200 9 units, 201-250 12 units, 251-300 14 units, 301- 400 17 units)    isosorbide mononitrate (IMDUR) 30 MG 24 hr tablet Take 0.5 tablets (15 mg total) by mouth daily.    levothyroxine  (SYNTHROID, LEVOTHROID) 50 MCG tablet Take 1 tablet (50 mcg total) by mouth daily before breakfast.    magnesium oxide (MAG-OX) 400 (240 Mg) MG tablet Take 1 tablet by mouth daily.    metolazone (ZAROXOLYN) 2.5 MG tablet Take 1 tablet (2.5 mg total) by mouth daily. 05/23/2021: Taking once weekly   Multiple Vitamins-Minerals (AIRBORNE GUMMIES PO) Take 2 tablets by mouth every evening.    Multiple Vitamins-Minerals (CENTRUM SILVER 50+WOMEN) TABS Take 1 tablet by mouth every evening.    nitroGLYCERIN (NITROSTAT) 0.4 MG SL tablet PLACE 1 TABLET (0.4 MG TOTAL) UNDER THE TONGUE EVERY 5 (FIVE) MINUTES AS NEEDED FOR CHEST PAIN.    ONETOUCH VERIO test strip 3 (three) times daily.    polyethylene glycol (MIRALAX / GLYCOLAX) 17 g packet 1 packet mixed with 8 ounces of fluid    potassium chloride SA (KLOR-CON M20) 20 MEQ tablet Take 1 tablet (20 mEq total) by mouth 2 (two) times a week. Monday and Friday    pregabalin (LYRICA) 150 MG capsule Take 150 mg by mouth 2 (two) times daily.    sitaGLIPtin (JANUVIA) 25 MG tablet Take 1 tablet (25 mg total) by mouth daily.    torsemide (DEMADEX) 20 MG tablet Take 80 mg by mouth 2 (two) times daily.    ursodiol (ACTIGALL) 500 MG tablet Take 500 mg by mouth 3 (three) times daily.    vitamin B-12 (CYANOCOBALAMIN) 1000 MCG tablet Take 1,000 mcg by mouth daily.    vortioxetine HBr (TRINTELLIX) 10 MG TABS tablet Take 1 tablet (10 mg total) by mouth daily.    zinc gluconate 50 MG tablet Take 50 mg by mouth daily.    No facility-administered encounter medications on file as of 06/12/2021.   Current antihyperglycemic regimen:  Januvia 25mg  daily Humulin N 15 units am if glucose > 300 Humulin R via sliding scale   What recent interventions/DTPs have been made to improve glycemic control:  Patient reported no changes in her medications recently. She has been making changes to her diet and drinking only water and has eliminated fried foods.   Have there been any recent  hospitalizations or ED visits since last visit with CPP?  Patient had a hospital admission from 03/05/21-03/10/21 for NSTEMI.  Patient denies hypoglycemic symptoms, including Pale, Sweaty, Shaky, Hungry, Nervous/irritable, and None   Patient denies hyperglycemic symptoms, including blurry vision, excessive thirst, fatigue, polyuria, and weakness   How often are you checking your blood sugar? Patient reported checking blood sugars four times day.   What are your blood sugars ranging?  Fasting: 200's Before meals:  After meals:  Bedtime:   During the week, how often does your blood glucose drop below 70?  Patient reported she has not had any low readings below 100.  Are you checking your feet daily/regularly? Patient reported she keeps a check on her feet closely.     Adherence Review: Is the patient currently on a STATIN medication? Yes Is the patient currently on ACE/ARB medication? No Does the patient have >5 day gap between last estimated fill dates? No   Care Gaps  AWV:  overdue Colonoscopy: done 03/13/19 DM Eye Exam: due 09/24/18 DM Foot Exam: unknown  Microalbumin: done 06/29/20 HbgAIC: done 08/26/20 (10.3) DEXA: unknown  Mammogram: done 11/22/15   [facility] Drugs: sitaGLIPtin (JANUVIA) 25 MG tablet - last filled 06/05/21 90 days  atorvastatin (LIPITOR) 80 MG tablet - last filled 04/02/21 90 days   Future Appointments  Date Time Provider [place]  07/08/2021  3:00 PM [facility] C[facility] None  07/11/2021  2:00 PM [name], MD [facility] None  08/22/2021  3:30 PM [facility] PHARMACIST [facility] None     Jobe Gibbon, CCMA Clinical Pharmacist Assistant  [phone]  8 minutes spent in review, coordination, and documentation.  Reviewed by: [name] Milch, PharmD Clinical Pharmacist [phone]

## 2021-06-12 NOTE — Telephone Encounter (Signed)
Spoke with pt and advised. She will come by and pick up sample collection kit.

## 2021-06-13 ENCOUNTER — Other Ambulatory Visit: Payer: Self-pay | Admitting: Family Medicine

## 2021-06-17 DIAGNOSIS — I5032 Chronic diastolic (congestive) heart failure: Secondary | ICD-10-CM | POA: Diagnosis not present

## 2021-06-21 ENCOUNTER — Telehealth: Payer: Self-pay | Admitting: Family Medicine

## 2021-06-21 NOTE — Telephone Encounter (Signed)
Left message for patient to call back and schedule Medicare Annual Wellness Visit (AWV) in office.  ° °If not able to come in office, please offer to do virtually or by telephone.  Left office number and my jabber #[phone]. ° °Due for AWVI ° °Please schedule at anytime with Nurse Health Advisor. °  °

## 2021-07-02 ENCOUNTER — Other Ambulatory Visit: Payer: Self-pay

## 2021-07-02 ENCOUNTER — Ambulatory Visit: Payer: HMO | Admitting: Orthopedic Surgery

## 2021-07-02 DIAGNOSIS — Z89431 Acquired absence of right foot: Secondary | ICD-10-CM | POA: Diagnosis not present

## 2021-07-02 DIAGNOSIS — B351 Tinea unguium: Secondary | ICD-10-CM

## 2021-07-02 DIAGNOSIS — L97919 Non-pressure chronic ulcer of unspecified part of right lower leg with unspecified severity: Secondary | ICD-10-CM | POA: Diagnosis not present

## 2021-07-02 DIAGNOSIS — L97929 Non-pressure chronic ulcer of unspecified part of left lower leg with unspecified severity: Secondary | ICD-10-CM | POA: Diagnosis not present

## 2021-07-02 DIAGNOSIS — I87333 Chronic venous hypertension (idiopathic) with ulcer and inflammation of bilateral lower extremity: Secondary | ICD-10-CM | POA: Diagnosis not present

## 2021-07-07 ENCOUNTER — Encounter: Payer: Self-pay | Admitting: Orthopedic Surgery

## 2021-07-07 NOTE — Progress Notes (Signed)
Office Visit Note   Patient: Susan [name]           Date of Birth: May 13, 1951           MRN: [id number] Visit Date: 07/02/2021              Requested by: [name], MD [address] [place],  [place] [address] PCP: [name], MD  Chief Complaint  Patient presents with   Right Foot - Follow-up    Hx transmet amp   Left Foot - Nail Problem      HPI: Patient is a 71 year old woman who presents in follow-up for both lower extremities.  She has had onychomycotic nails of the left foot status post a right transmetatarsal amputation.  Patient states she has had an open wound on the left foot for about a week she is wearing knee-high compression stockings.  Assessment & Plan: Visit Diagnoses:  1. Onychomycosis   2. History of transmetatarsal amputation of right foot ([place])   3. Idiopathic chronic venous hypertension of both lower extremities with ulcer and inflammation ([place])     Plan: Patient was given a prescription for 2 XL compression socks.  The callus was pared nails were trimmed.  Follow-Up Instructions: Return in about 3 months (around 09/29/2021).   Ortho Exam  Patient is alert, oriented, no adenopathy, well-dressed, normal affect, normal respiratory effort. Examination patient has venous insufficiency with very small ulcer on the left calf.  Calf was measured and recommended a 2 XL compression sock.  She has some callus on the left great toe when this was pared no abscess no signs of infection.  She has thickened discolored onychomycotic nails the nails were trimmed x2 without complications.  No signs of infection.  Imaging: No results found. No images are attached to the encounter.  Labs: Lab Results  Component Value Date   HGBA1C 12.2 (H) 03/05/2021   HGBA1C 10.3 (H) 08/26/2020   HGBA1C 11.0 (H) 08/14/2020   CRP 0.7 08/22/2020   CRP 1.5 (H) 03/13/2020   CRP 1.9 (H) 03/12/2020   LABURIC 4.7 08/09/2020   LABURIC 9.1 (H) 10/12/2018   LABURIC  9.0 (H) 03/06/2018   REPTSTATUS 03/09/2021 FINAL 03/06/2021   CULT >=100,000 COLONIES/mL ENTEROBACTER CLOACAE (A) 03/06/2021   LABORGA ENTEROBACTER CLOACAE (A) 03/06/2021     Lab Results  Component Value Date   ALBUMIN 2.8 (L) 08/22/2020   ALBUMIN 3.2 (L) 08/21/2020   ALBUMIN 2.3 (L) 05/28/2020    Lab Results  Component Value Date   MG 2.6 (H) 03/07/2021   MG 2.2 08/22/2020   MG 1.9 08/19/2020   No results found for: VD25OH  No results found for: PREALBUMIN CBC EXTENDED Latest Ref Rng & Units 03/15/2021 03/09/2021 03/07/2021  WBC 3.8 - 10.8 Thousand/uL 9.0 6.5 7.5  RBC 3.80 - 5.10 Million/uL 4.52 3.95 4.26  HGB 11.7 - 15.5 g/dL 12.2 10.6(L) 11.4(L)  HCT 35.0 - 45.0 % 38.7 34.2(L) 36.1  PLT 140 - 400 Thousand/uL 182 159 158  NEUTROABS 1,500 - 7,800 cells/uL 6,984 - -  LYMPHSABS 850 - 3,900 cells/uL 864 - -     There is no height or weight on file to calculate BMI.  Orders:  No orders of the defined types were placed in this encounter.  No orders of the defined types were placed in this encounter.    Procedures: No procedures performed  Clinical Data: No additional findings.  ROS:  All other systems negative,  except as noted in the HPI. Review of Systems  Objective: Vital Signs: There were no vitals taken for this visit.  Specialty Comments:  No specialty comments available.  PMFS History: Patient Active Problem List   Diagnosis Date Noted   NSTEMI (non-ST elevated myocardial infarction) ([place]) 03/05/2021   Acute renal failure superimposed on stage 4 chronic kidney disease ([place]) 08/22/2020   Hypoalbuminemia 05/25/2020   GERD (gastroesophageal reflux disease) 05/25/2020   Pressure injury of skin 05/17/2020   Type 2 diabetes mellitus with diabetic polyneuropathy, with long-term current use of insulin ([place]) 03/07/2020   Obesity, Class III, BMI 40-49.9 (morbid obesity) ([name]) 03/07/2020   Common bile duct (CBD) obstruction 05/28/2019   Benign neoplasm of  ascending colon    Benign neoplasm of transverse colon    Benign neoplasm of descending colon    Benign neoplasm of sigmoid colon    Gastric polyps    Hyperkalemia 03/11/2019   Prolonged QT interval 03/11/2019   Onychomycosis 06/21/2018   Osteomyelitis of second toe of right foot ([place])    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 (H[facility])    PVD (peripheral vascular disease) ([place]) 10/26/2017   E-coli UTI 07/27/2017   Hypothyroidism 07/27/2017   PAH (pulmonary artery hypertension) ([facility])    Impaired ambulation 07/19/2017   Leg cramps 02/27/2017   Peripheral edema 01/12/2017   Diabetic neuropathy ([place]) 11/12/2016   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 mellitus ([place]) 10/24/2015   Anemia 10/03/2015   Generalized anxiety disorder 10/03/2015   Insomnia 10/03/2015   Hyperglycemia due to diabetes mellitus ([place]) 06/07/2015   Chronic diastolic CHF (congestive heart failure) ([place]) 06/07/2015   Non compliance with medical treatment 04/17/2014   Rotator cuff tear 03/14/2014   Class 3 obesity ([place]) 09/23/2013   Hypotension 12/25/2012   Urinary incontinence    MDD (major depressive disorder) 11/12/2010   RBBB (right bundle branch block)    Coronary artery disease    Hyperlipemia 01/22/2009   Essential hypertension 01/22/2009   Past Medical History:  Diagnosis Date   Acute MI ([place]) 1999; 2007   Anemia    hx   Anginal pain ([place])    Anxiety    ARF (acute renal failure) ([place]) 06/2017   Tina Kidney Asso   Arthritis    "generalized" (03/15/2014)   CAD (coronary artery disease)    MI in 2000 - MI  2007 - treated bare metal stent (no nuclear since then as 9/11)   Carotid artery disease ([place])    CHF (congestive heart failure) ([place])    Chronic diastolic heart failure ([place])    a) ECHO (08/2013) EF 55-60% and RV function nl b) RHC (08/2013) RA 4, RV 30/5/7, PA 25/10 (16), PCWP 7, Fick CO/CI 6.3/2.7, PVR 1.5 WU, PA 61 and 66%   Daily headache    "~ every other day; since I fell  in June" (03/15/2014)   Depression    Dyslipidemia    Dyspnea    Exertional shortness of breath    HTN (hypertension)    Hypothyroidism    Neuropathy    Obesity    Osteoarthritis    Peripheral neuropathy    PONV (postoperative nausea and vomiting)    RBBB (right bundle branch block)    Old   Stroke ([place])    mini strokes   Syncope    likely due to low blood sugar   Tachycardia    Sinus tachycardia   Type II diabetes mellitus ([place])  Type II   Urinary incontinence    Venous insufficiency     Family History  Problem Relation Age of Onset   Heart attack Mother 70    Past Surgical History:  Procedure Laterality Date   ABDOMINAL HYSTERECTOMY  1980's   AMPUTATION Right 02/24/2018   Procedure: RIGHT FOOT GREAT TOE AND 2ND TOE AMPUTATION;  Surgeon: Newt Minion, MD;  Location: [facility];  Service: Orthopedics;  Laterality: Right;   AMPUTATION Right 04/30/2018   Procedure: RIGHT TRANSMETATARSAL AMPUTATION;  Surgeon: Newt Minion, MD;  Location: [facility];  Service: Orthopedics;  Laterality: Right;   BIOPSY  05/27/2020   Procedure: BIOPSY;  Surgeon: [name], DO;  [facility];  Service: Endoscopy;;   CATARACT EXTRACTION, BILATERAL Bilateral ?2013   COLONOSCOPY W/ POLYPECTOMY     COLONOSCOPY WITH PROPOFOL N/A 03/13/2019   Procedure: COLONOSCOPY WITH PROPOFOL;  Surgeon: Jerene Bears, MD;  Location: [facility];  Service: Gastroenterology;  Laterality: N/A;   [place]; 2007   "1 + 1"   ESOPHAGOGASTRODUODENOSCOPY (EGD) WITH PROPOFOL N/A 03/13/2019   Procedure: ESOPHAGOGASTRODUODENOSCOPY (EGD) WITH PROPOFOL;  Surgeon: Jerene Bears, MD;  Location: [facility];  Service: Gastroenterology;  Laterality: N/A;   ESOPHAGOGASTRODUODENOSCOPY (EGD) WITH PROPOFOL N/A 05/27/2020   Procedure: ESOPHAGOGASTRODUODENOSCOPY (EGD) WITH PROPOFOL;  Surgeon: [name], DO;  [facility];  Service: Endoscopy;  Laterality: N/A;   EYE  SURGERY Bilateral    [facility]   HEMOSTASIS CLIP PLACEMENT  03/13/2019   Procedure: HEMOSTASIS CLIP PLACEMENT;  Surgeon: Jerene Bears, MD;  Location: [facility] ENDOSCOPY;  Service: Gastroenterology;;   KNEE ARTHROSCOPY Left 10/25/2006   POLYPECTOMY  03/13/2019   Procedure: POLYPECTOMY;  Surgeon: Jerene Bears, MD;  Location: [facility];  Service: Gastroenterology;;   RIGHT HEART CATH N/A 07/24/2017   Procedure: RIGHT HEART CATH;  Surgeon: Jolaine Artist, MD;  Location: [facility];  Service: Cardiovascular;  Laterality: N/A;   RIGHT HEART CATHETERIZATION N/A 09/22/2013   Procedure: RIGHT HEART CATH;  Surgeon: Jolaine Artist, MD;  [facility];  Service: Cardiovascular;  Laterality: N/A;   SHOULDER ARTHROSCOPY WITH OPEN ROTATOR CUFF REPAIR Right 03/14/2014   Procedure: RIGHT SHOULDER ARTHROSCOPY WITH BICEPS RELEASE, OPEN SUBSCAPULA REPAIR, OPEN SUPRASPINATUS REPAIR.;  Surgeon: Meredith Pel, MD;  Location: [facility];  Service: Orthopedics;  Laterality: Right;   TOE AMPUTATION Right 02/24/2018   GREAT TOE AND 2ND TOE AMPUTATION   TUBAL LIGATION  1970's   Social History   Occupational History    Employer: UNEMPLOYED  Tobacco Use   Smoking status: Former    Packs/day: 3.00    Years: 32.00    Pack years: 96.00    Types: Cigarettes    Quit date: 10/24/1997    Years since quitting: 23.7   Smokeless tobacco: Never  Vaping Use   Vaping Use: Never used  Substance and Sexual Activity   Alcohol use: Not Currently    Comment: "might have 2-3 daiquiris in the summer"   Drug use: No   Sexual activity: Not Currently

## 2021-07-08 ENCOUNTER — Ambulatory Visit (INDEPENDENT_AMBULATORY_CARE_PROVIDER_SITE_OTHER): Payer: HMO | Admitting: *Deleted

## 2021-07-08 DIAGNOSIS — E1142 Type 2 diabetes mellitus with diabetic polyneuropathy: Secondary | ICD-10-CM

## 2021-07-08 DIAGNOSIS — I5032 Chronic diastolic (congestive) heart failure: Secondary | ICD-10-CM

## 2021-07-08 DIAGNOSIS — Z794 Long term (current) use of insulin: Secondary | ICD-10-CM

## 2021-07-08 NOTE — Chronic Care Management (AMB) (Signed)
Chronic Care Management   CCM RN Visit Note  07/08/2021 Name: Zona Pedro MRN: [id number] DOB: January 08, 1951  Subjective: Shiesha Jahn is a 71 y.o. year old female who is a primary care patient of [name], [name], MD. The care management team was consulted for assistance with disease management and care coordination needs.    Engaged with patient by telephone for follow up visit in response to provider referral for case management and/or care coordination services.   Consent to Services:  The patient was given information about Chronic Care Management services, agreed to services, and gave verbal consent prior to initiation of services.  Please see initial visit note for detailed documentation.   Patient agreed to services and verbal consent obtained.   Assessment: Review of patient past medical history, allergies, medications, health status, including review of consultants reports, laboratory and other test data, was performed as part of comprehensive evaluation and provision of chronic care management services.   SDOH (Social Determinants of Health) assessments and interventions performed:    CCM Care Plan  Allergies  Allergen Reactions   Drug Ingredient [Cephalexin] Diarrhea    Pt reports heavy diarrhea with use of keflex   Codeine Nausea And Vomiting and Other (See Comments)    Outpatient Encounter Medications as of 07/08/2021  Medication Sig Note   acetaminophen (TYLENOL) 500 MG tablet Take 1,000 mg by mouth every 6 (six) hours as needed for moderate pain or headache.    albuterol (VENTOLIN HFA) 108 (90 Base) MCG/ACT inhaler TAKE 2 PUFFS BY MOUTH EVERY 6 HOURS AS NEEDED FOR WHEEZE OR SHORTNESS OF BREATH    allopurinol (ZYLOPRIM) 100 MG tablet Take 0.5 tablets (50 mg total) by mouth daily.    Ascorbic Acid (VITAMIN C) 1000 MG tablet Take 1,000 mg by mouth daily.    aspirin EC 81 MG tablet Take 1 tablet (81 mg total) by mouth daily with breakfast.    atorvastatin (LIPITOR) 80 MG  tablet Take 1 tablet (80 mg total) by mouth daily.    carvedilol (COREG) 12.5 MG tablet TAKE 1 TABLET (12.5 MG TOTAL) BY MOUTH 2 (TWO) TIMES DAILY WITH A MEAL.    cephALEXin (KEFLEX) 250 MG capsule TAKE 1 CAPSULE BY MOUTH EVERY DAY    cholecalciferol (VITAMIN D) 1000 units tablet Take 1,000 Units by mouth daily with supper.     clopidogrel (PLAVIX) 75 MG tablet TAKE 1 TABLET BY MOUTH DAILY WITH BREAKFAST.    Cranberry 250 MG TABS Take 500 mg by mouth daily.    ferrous sulfate 325 (65 FE) MG tablet Take 1 tablet (325 mg total) by mouth daily with breakfast. Reported on 08/21/2015 05/23/2021: Taking twice daily   guaiFENesin-dextromethorphan (ROBITUSSIN DM) 100-10 MG/5ML syrup Take 10 mLs by mouth every 4 (four) hours as needed for cough.    hydrocortisone cream 0.5 % Apply 1 application topically 2 (two) times daily.    Insulin Disposable Pump (OMNIPOD DASH 5 PACK PODS) MISC Inject into the skin See admin instructions. Use continuously with Novolin R - change every 72 hours.    insulin NPH Human (HUMULIN N,NOVOLIN N) 100 UNIT/ML injection Inject 15 Units into the skin See admin instructions. Inject 15 units subcutaneously in the morning if CBG >300;    insulin regular (NOVOLIN R) 100 units/mL injection Inject into the skin See admin instructions. Manually add bolus to continuous dose via OmniPod 3 times daily per sliding scale (CBG 80-150 7 units, 151-200 9 units, 201-250 12 units, 251-300 14 units, 301- 400  17 units)    isosorbide mononitrate (IMDUR) 30 MG 24 hr tablet Take 0.5 tablets (15 mg total) by mouth daily.    levothyroxine (SYNTHROID, LEVOTHROID) 50 MCG tablet Take 1 tablet (50 mcg total) by mouth daily before breakfast.    magnesium oxide (MAG-OX) 400 (240 Mg) MG tablet Take 1 tablet by mouth daily.    Multiple Vitamins-Minerals (AIRBORNE GUMMIES PO) Take 2 tablets by mouth every evening.    Multiple Vitamins-Minerals (CENTRUM SILVER 50+WOMEN) TABS Take 1 tablet by mouth every evening.     nitroGLYCERIN (NITROSTAT) 0.4 MG SL tablet PLACE 1 TABLET (0.4 MG TOTAL) UNDER THE TONGUE EVERY 5 (FIVE) MINUTES AS NEEDED FOR CHEST PAIN.    ONETOUCH VERIO test strip 3 (three) times daily.    polyethylene glycol (MIRALAX / GLYCOLAX) 17 g packet 1 packet mixed with 8 ounces of fluid    potassium chloride SA (KLOR-CON M20) 20 MEQ tablet Take 1 tablet (20 mEq total) by mouth 2 (two) times a week. Monday and Friday    pregabalin (LYRICA) 150 MG capsule Take 150 mg by mouth 2 (two) times daily.    sitaGLIPtin (JANUVIA) 25 MG tablet Take 1 tablet (25 mg total) by mouth daily.    torsemide (DEMADEX) 20 MG tablet Take 80 mg by mouth 2 (two) times daily.    vitamin B-12 (CYANOCOBALAMIN) 1000 MCG tablet Take 1,000 mcg by mouth daily.    vortioxetine HBr (TRINTELLIX) 10 MG TABS tablet Take 1 tablet (10 mg total) by mouth daily.    zinc gluconate 50 MG tablet Take 50 mg by mouth daily.    metolazone (ZAROXOLYN) 2.5 MG tablet Take 1 tablet (2.5 mg total) by mouth daily. 05/23/2021: Taking once weekly   ursodiol (ACTIGALL) 500 MG tablet Take 500 mg by mouth 3 (three) times daily. (Patient not taking: Reported on 07/08/2021)    No facility-administered encounter medications on file as of 07/08/2021.    Patient Active Problem List   Diagnosis Date Noted   NSTEMI (non-ST elevated myocardial infarction) ([place]) 03/05/2021   Acute renal failure superimposed on stage 4 chronic kidney disease ([place]) 08/22/2020   Hypoalbuminemia 05/25/2020   GERD (gastroesophageal reflux disease) 05/25/2020   Pressure injury of skin 05/17/2020   Type 2 diabetes mellitus with diabetic polyneuropathy, with long-term current use of insulin ([place]) 03/07/2020   Obesity, Class III, BMI 40-49.9 (morbid obesity) ([place]) 03/07/2020   Common bile duct (CBD) obstruction 05/28/2019   Benign neoplasm of ascending colon    Benign neoplasm of transverse colon    Benign neoplasm of descending colon    Benign neoplasm of sigmoid colon    Gastric  polyps    Hyperkalemia 03/11/2019   Prolonged QT interval 03/11/2019   Onychomycosis 06/21/2018   Osteomyelitis of second toe of right foot ([place])    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 ([facility])    PVD (peripheral vascular disease) ([name]) 10/26/2017   E-coli UTI 07/27/2017   Hypothyroidism 07/27/2017   PAH (pulmonary artery hypertension) ([facility])    Impaired ambulation 07/19/2017   Leg cramps 02/27/2017   Peripheral edema 01/12/2017   Diabetic neuropathy ([place]) 11/12/2016   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 mellitus ([place]) 10/24/2015   Anemia 10/03/2015   Generalized anxiety disorder 10/03/2015   Insomnia 10/03/2015   Hyperglycemia due to diabetes mellitus ([place]) 06/07/2015   Chronic diastolic CHF (congestive heart failure) ([place]) 06/07/2015   Non compliance with medical treatment 04/17/2014   Rotator cuff tear 03/14/2014  Class 3 obesity ([place]) 09/23/2013   Hypotension 12/25/2012   Urinary incontinence    MDD (major depressive disorder) 11/12/2010   RBBB (right bundle branch block)    Coronary artery disease    Hyperlipemia 01/22/2009   Essential hypertension 01/22/2009    Conditions to be addressed/monitored:CHF and DMII  Care Plan : RN Care Manager plan of care  Updates made by Kassie Mends, RN since 07/08/2021 12:00 AM     Problem: No plan of care established for management of chronic disease states (CHF, DM2, HTN, HLD, CKD stage 4)   Priority: High     Long-Range Goal: Development of plan of care for chronic disease management (CHF, DM2, HTN, CAD, HLD, CKD stage 4)   Start Date: 04/08/2021  Expected End Date: 10/05/2021  Priority: High  Note:   Current Barriers:  Knowledge Deficits related to plan of care for management of CHF and DMII  Financial Constraints.   Pt reports her spouse does not want her to complete forms for medication assistance because of divulging income information, etc.and she did not complete Knowledge deficit related to basic  heart failure pathophysiology and self care management- pt lives with spouse, pt is independent in most all aspects of her care, has walker and cane to use as needed, continues to drive. Pt reports she forgets sometimes to weigh, weight ranges 260-265 pounds. Pt in hospital 03/05/21 for NSTEMI, has nitroglycerin on hand if needed,, reports she is taking medications as prescribed except for ursodiol which she cannot afford.  Patient reports trintellix continues to work well with alleviating symptoms of depression and she would like to continue taking.  Pt reports she is no longer having legs wrapped with unna boots and is wearing compression hose daily, states she will be seeing primary care provider 07/11/21 and will discuss issues with her legs as she feels like " there are some tiny places that look like they're going to open up again" Patient reports CBG improved and she checks blood sugar 2 x per day with fasting ranges 200's most everyday and today's reading 245, pt reports she has a problem with sweets and drinks sugary colas most days and unable to go without caffeine.  RNCM Clinical Goal(s):  Patient will verbalize understanding of plan for management of CHF and DMII as evidenced by review of EHR, pt report. take all medications exactly as prescribed and will call provider for medication related questions as evidenced by review of EHR, per pt report and collaboration with CCM pharmacist.    attend all scheduled medical appointments: 06/04/21 primary care provider  as evidenced by review of EHR, pt report and        through collaboration with RN Care manager, provider, and care team.   Interventions: 1:1 collaboration with primary care provider regarding development and update of comprehensive plan of care as evidenced by provider attestation and co-signature Inter-disciplinary care team collaboration (see longitudinal plan of care) Evaluation of current treatment plan related to  self management and  patient's adherence to plan as established by provider  Diabetes Interventions: Assessed patient's understanding of A1c goal: <7% Reviewed medications with patient and discussed importance of medication adherence Reviewed scheduled/upcoming provider appointments including:  07/11/2021 primary care provider Review of patient status, including review of consultants reports, relevant laboratory and other test results, and medications completed In basket sent to CCM pharmacist reporting patient may not complete forms for assistance with medications Reviewed carbohydrate modified diet and reviewed suitable food choices Reinforced with  patient to drink unsweetened tea with maybe lemon and importance of drinking water Reviewed signs/ symptoms hypo/hyperglycemia and actions to take Reviewed signs/ symptoms of infection and actions to take, reviewed effect on blood sugar Lab Results  Component Value Date   HGBA1C 12.2 (H) 03/05/2021  Heart Failure Interventions: Discussed importance of daily weight and advised patient to weigh and record daily Reinforced low sodium diet Reviewed HF action plan with emphasis on yellow zone Reviewed importance of having nitroglycerin on hand and reviewed instructions on how to take and calling 911 if no relief Encouraged patient to continue wearing compression hose daily Pain assessment completed  Patient Goals/Self-Care Activities: Patient will self administer medications as prescribed as evidenced by self report/primary caregiver report  Patient will attend all scheduled provider appointments as evidenced by clinician review of documented attendance to scheduled appointments and patient/caregiver report Patient will call provider office for new concerns or questions as evidenced by review of documented incoming telephone call notes and patient report  Continue to weigh daily- Call your doctor if you gain 3 pounds overnight or 5 pounds in a week Continue wearing  compression hose daily Follow low sodium diet- limit fast food Follow carbohydrate modified diet- be mindful of carbohydrate intake at each meal Eliminate sugary soft drinks, try unsweetened tea with lemon instead and water is always best If you have chest pain, use nitroglycerin x 3, if no relief, call 911 Go to emergency room if your sores on your legs worsen with redness, drainage, fever  Practice good handwashing Follow up with primary care provider 07/11/21 Call RN care manager for any questions at [phone]       Plan:Telephone follow up appointment with care management team member scheduled for:  08/05/21  Jacqlyn Larsen [facility], BSN RN Case Manager [facility] [phone]

## 2021-07-08 NOTE — Patient Instructions (Signed)
Visit Information  Thank you for taking time to visit with me today. Please don't hesitate to contact me if I can be of assistance to you before our next scheduled telephone appointment.  Following are the goals we discussed today:  Patient will self administer medications as prescribed as evidenced by self report/primary caregiver report  Patient will attend all scheduled provider appointments as evidenced by clinician review of documented attendance to scheduled appointments and patient/caregiver report Patient will call provider office for new concerns or questions as evidenced by review of documented incoming telephone call notes and patient report  Continue to weigh daily- Call your doctor if you gain 3 pounds overnight or 5 pounds in a week Continue wearing compression hose daily Follow low sodium diet- limit fast food Follow carbohydrate modified diet- be mindful of carbohydrate intake at each meal Eliminate sugary soft drinks, try unsweetened tea with lemon instead and water is always best If you have chest pain, use nitroglycerin x 3, if no relief, call 911 Go to emergency room if your sores on your legs worsen with redness, drainage, fever  Practice good handwashing Follow up with primary care provider 07/11/21 Call RN care manager for any questions at [phone]  Our next appointment is by telephone on 08/05/21 at 1045 am  Please call the care guide team at [phone] if you need to cancel or reschedule your appointment.   If you are experiencing a Mental Health or [place] or need someone to talk to, please call the Suicide and Crisis Lifeline: 988 call the [place] National Suicide Prevention Lifeline: 2206512432 or TTY: 6826598065 TTY 2397211852) to talk to a trained counselor call 1-800-273-TALK (toll free, 24 hour hotline) go to [facility] [place] [phone]) call 911   The patient  verbalized understanding of instructions, educational materials, and care plan provided today and declined offer to receive copy of patient instructions, educational materials, and care plan.   Jacqlyn Larsen RNC, BSN RN Case Manager [facility] [phone]

## 2021-07-11 ENCOUNTER — Encounter: Payer: Self-pay | Admitting: Family Medicine

## 2021-07-11 ENCOUNTER — Ambulatory Visit (INDEPENDENT_AMBULATORY_CARE_PROVIDER_SITE_OTHER): Payer: HMO | Admitting: Family Medicine

## 2021-07-11 ENCOUNTER — Other Ambulatory Visit: Payer: Self-pay

## 2021-07-11 VITALS — BP 124/72 | HR 75 | Temp 97.5°F | Resp 18 | Ht 66.0 in | Wt 260.0 lb

## 2021-07-11 DIAGNOSIS — L03116 Cellulitis of left lower limb: Secondary | ICD-10-CM | POA: Diagnosis not present

## 2021-07-11 DIAGNOSIS — E1165 Type 2 diabetes mellitus with hyperglycemia: Secondary | ICD-10-CM | POA: Diagnosis not present

## 2021-07-11 DIAGNOSIS — I83029 Varicose veins of left lower extremity with ulcer of unspecified site: Secondary | ICD-10-CM | POA: Diagnosis not present

## 2021-07-11 DIAGNOSIS — L97929 Non-pressure chronic ulcer of unspecified part of left lower leg with unspecified severity: Secondary | ICD-10-CM | POA: Diagnosis not present

## 2021-07-11 DIAGNOSIS — N184 Chronic kidney disease, stage 4 (severe): Secondary | ICD-10-CM

## 2021-07-11 DIAGNOSIS — I5032 Chronic diastolic (congestive) heart failure: Secondary | ICD-10-CM

## 2021-07-11 MED ORDER — DOXYCYCLINE HYCLATE 100 MG PO TABS: 100.0000 mg | ORAL_TABLET | Freq: Two times a day (BID) | ORAL | 0 refills | Status: DC

## 2021-07-11 NOTE — Progress Notes (Signed)
Subjective:    Patient ID: Susan [name], female    DOB: 03/15/1951, 71 y.o.   MRN: [id number]  HPI   The patient's legs distal to the knee are swollen with +2 edema.  The skin is little splitting open on her anterior right skin and weeping serous.  She has a nickel sized lesion on her anterior left shin that is weeping bloody fluid.  There is a horizontal split/fissure in the superior lateral aspect of her left shin that is weeping serous fluid.  The surrounding skin in that area is erythematous and extremely tender indicating a secondary infection.  She was here today primarily for follow-up however she is concerned because of the increasing swelling and pain in both of her legs.  She is taking 80 mg twice daily of her diuretic.  Her blood sugars prior to this week were under 200.  However she states that as the leg is gotten infected her blood sugar is now over 400 Past Medical History:  Diagnosis Date   Acute MI ([place]) 1999; 2007   Anemia    hx   Anginal pain ([place])    Anxiety    ARF (acute renal failure) ([place]) 06/2017   [facility]   Arthritis    "generalized" (03/15/2014)   CAD (coronary artery disease)    MI in 2000 - MI  2007 - treated bare metal stent (no nuclear since then as 9/11)   Carotid artery disease (HCC)    CHF (congestive heart failure) ([place])    Chronic diastolic heart failure ([place])    a) ECHO (08/2013) EF 55-60% and RV function nl b) RHC (08/2013) RA 4, RV 30/5/7, PA 25/10 (16), PCWP 7, Fick CO/CI 6.3/2.7, PVR 1.5 WU, PA 61 and 66%   Daily headache    "~ every other day; since I fell in June" (03/15/2014)   Depression    Dyslipidemia    Dyspnea    Exertional shortness of breath    HTN (hypertension)    Hypothyroidism    Neuropathy    Obesity    Osteoarthritis    Peripheral neuropathy    PONV (postoperative nausea and vomiting)    RBBB (right bundle branch block)    Old   Stroke ([place])    mini strokes   Syncope    likely due to low blood sugar    Tachycardia    Sinus tachycardia   Type II diabetes mellitus ([place])    Type II   Urinary incontinence    Venous insufficiency    Past Surgical History:  Procedure Laterality Date   ABDOMINAL HYSTERECTOMY  1980's   AMPUTATION Right 02/24/2018   Procedure: RIGHT FOOT GREAT TOE AND 2ND TOE AMPUTATION;  Surgeon: Newt Minion, MD;  Location: [facility];  Service: Orthopedics;  Laterality: Right;   AMPUTATION Right 04/30/2018   Procedure: RIGHT TRANSMETATARSAL AMPUTATION;  Surgeon: Newt Minion, MD;  Location: [place];  Service: Orthopedics;  Laterality: Right;   BIOPSY  05/27/2020   Procedure: BIOPSY;  Surgeon: [name], DO;  [facility];  Service: Endoscopy;;   CATARACT EXTRACTION, BILATERAL Bilateral ?2013   COLONOSCOPY W/ POLYPECTOMY     COLONOSCOPY WITH PROPOFOL N/A 03/13/2019   Procedure: COLONOSCOPY WITH PROPOFOL;  Surgeon: Jerene Bears, MD;  Location: [facility];  Service: Gastroenterology;  Laterality: N/A;   [place]; 2007   "1 + 1"   ESOPHAGOGASTRODUODENOSCOPY (EGD) WITH PROPOFOL N/A 03/13/2019  Procedure: ESOPHAGOGASTRODUODENOSCOPY (EGD) WITH PROPOFOL;  Surgeon: Jerene Bears, MD;  Location: [facility] ENDOSCOPY;  Service: Gastroenterology;  Laterality: N/A;   ESOPHAGOGASTRODUODENOSCOPY (EGD) WITH PROPOFOL N/A 05/27/2020   Procedure: ESOPHAGOGASTRODUODENOSCOPY (EGD) WITH PROPOFOL;  Surgeon: [name], DO;  [facility];  Service: Endoscopy;  Laterality: N/A;   EYE SURGERY Bilateral    lazer   HEMOSTASIS CLIP PLACEMENT  03/13/2019   Procedure: HEMOSTASIS CLIP PLACEMENT;  Surgeon: Jerene Bears, MD;  Location: [facility] ENDOSCOPY;  Service: Gastroenterology;;   KNEE ARTHROSCOPY Left 10/25/2006   POLYPECTOMY  03/13/2019   Procedure: POLYPECTOMY;  Surgeon: Jerene Bears, MD;  Location: [place];  Service: Gastroenterology;;   RIGHT HEART CATH N/A 07/24/2017   Procedure: RIGHT HEART CATH;  Surgeon: Jolaine Artist, MD;   Location: [place] CV LAB;  Service: Cardiovascular;  Laterality: N/A;   RIGHT HEART CATHETERIZATION N/A 09/22/2013   Procedure: RIGHT HEART CATH;  Surgeon: Jolaine Artist, MD;  Location: [facility] CATH LAB;  Service: Cardiovascular;  Laterality: N/A;   SHOULDER ARTHROSCOPY WITH OPEN ROTATOR CUFF REPAIR Right 03/14/2014   Procedure: RIGHT SHOULDER ARTHROSCOPY WITH BICEPS RELEASE, OPEN SUBSCAPULA REPAIR, OPEN SUPRASPINATUS REPAIR.;  Surgeon: Meredith Pel, MD;  Location: [place];  Service: Orthopedics;  Laterality: Right;   TOE AMPUTATION Right 02/24/2018   GREAT TOE AND 2ND TOE AMPUTATION   TUBAL LIGATION  1970's   Current Outpatient Medications on File Prior to Visit  Medication Sig Dispense Refill   acetaminophen (TYLENOL) 500 MG tablet Take 1,000 mg by mouth every 6 (six) hours as needed for moderate pain or headache.     albuterol (VENTOLIN HFA) 108 (90 Base) MCG/ACT inhaler TAKE 2 PUFFS BY MOUTH EVERY 6 HOURS AS NEEDED FOR WHEEZE OR SHORTNESS OF BREATH 8.5 g 1   allopurinol (ZYLOPRIM) 100 MG tablet Take 0.5 tablets (50 mg total) by mouth daily. 30 tablet 0   Ascorbic Acid (VITAMIN C) 1000 MG tablet Take 1,000 mg by mouth daily.     aspirin EC 81 MG tablet Take 1 tablet (81 mg total) by mouth daily with breakfast. 30 tablet 11   atorvastatin (LIPITOR) 80 MG tablet Take 1 tablet (80 mg total) by mouth daily. 30 tablet 5   carvedilol (COREG) 12.5 MG tablet TAKE 1 TABLET (12.5 MG TOTAL) BY MOUTH 2 (TWO) TIMES DAILY WITH A MEAL. 180 tablet 1   cephALEXin (KEFLEX) 250 MG capsule TAKE 1 CAPSULE BY MOUTH EVERY DAY 30 capsule 2   cholecalciferol (VITAMIN D) 1000 units tablet Take 1,000 Units by mouth daily with supper.      clopidogrel (PLAVIX) 75 MG tablet TAKE 1 TABLET BY MOUTH DAILY WITH BREAKFAST. 90 tablet 3   Cranberry 250 MG TABS Take 500 mg by mouth daily.     ferrous sulfate 325 (65 FE) MG tablet Take 1 tablet (325 mg total) by mouth daily with breakfast. Reported on 08/21/2015 60 tablet  2   guaiFENesin-dextromethorphan (ROBITUSSIN DM) 100-10 MG/5ML syrup Take 10 mLs by mouth every 4 (four) hours as needed for cough. 118 mL 0   hydrocortisone cream 0.5 % Apply 1 application topically 2 (two) times daily. 30 g 0   Insulin Disposable Pump (OMNIPOD DASH 5 PACK PODS) MISC Inject into the skin See admin instructions. Use continuously with Novolin R - change every 72 hours.     insulin NPH Human (HUMULIN N,NOVOLIN N) 100 UNIT/ML injection Inject 15 Units into the skin See admin instructions. Inject 15 units subcutaneously  in the morning if CBG >300;     insulin regular (NOVOLIN R) 100 units/mL injection Inject into the skin See admin instructions. Manually add bolus to continuous dose via OmniPod 3 times daily per sliding scale (CBG 80-150 7 units, 151-200 9 units, 201-250 12 units, 251-300 14 units, 301- 400 17 units)     isosorbide mononitrate (IMDUR) 30 MG 24 hr tablet Take 0.5 tablets (15 mg total) by mouth daily. 30 tablet 6   levothyroxine (SYNTHROID, LEVOTHROID) 50 MCG tablet Take 1 tablet (50 mcg total) by mouth daily before breakfast. 90 tablet 1   magnesium oxide (MAG-OX) 400 (240 Mg) MG tablet Take 1 tablet by mouth daily.     metolazone (ZAROXOLYN) 2.5 MG tablet Take 1 tablet (2.5 mg total) by mouth daily. 90 tablet 3   Multiple Vitamins-Minerals (AIRBORNE GUMMIES PO) Take 2 tablets by mouth every evening.     Multiple Vitamins-Minerals (CENTRUM SILVER 50+WOMEN) TABS Take 1 tablet by mouth every evening.     nitroGLYCERIN (NITROSTAT) 0.4 MG SL tablet PLACE 1 TABLET (0.4 MG TOTAL) UNDER THE TONGUE EVERY 5 (FIVE) MINUTES AS NEEDED FOR CHEST PAIN. 25 tablet 1   ONETOUCH VERIO test strip 3 (three) times daily.     polyethylene glycol (MIRALAX / GLYCOLAX) 17 g packet 1 packet mixed with 8 ounces of fluid     potassium chloride SA (KLOR-CON M20) 20 MEQ tablet Take 1 tablet (20 mEq total) by mouth 2 (two) times a week. Monday and Friday 30 tablet 1   pregabalin (LYRICA) 150 MG capsule  Take 150 mg by mouth 2 (two) times daily.     sitaGLIPtin (JANUVIA) 25 MG tablet Take 1 tablet (25 mg total) by mouth daily. 90 tablet 3   torsemide (DEMADEX) 20 MG tablet Take 80 mg by mouth 2 (two) times daily.     ursodiol (ACTIGALL) 500 MG tablet Take 500 mg by mouth 3 (three) times daily. (Patient not taking: Reported on 07/08/2021)     vitamin B-12 (CYANOCOBALAMIN) 1000 MCG tablet Take 1,000 mcg by mouth daily.     vortioxetine HBr (TRINTELLIX) 10 MG TABS tablet Take 1 tablet (10 mg total) by mouth daily. 30 tablet 5   zinc gluconate 50 MG tablet Take 50 mg by mouth daily.     No current facility-administered medications on file prior to visit.   Allergies  Allergen Reactions   Drug Ingredient [Cephalexin] Diarrhea    Pt reports heavy diarrhea with use of keflex   Codeine Nausea And Vomiting and Other (See Comments)   Social History   Socioeconomic History   Marital status: Married    Spouse name: Not on file   Number of children: 3   Years of education: 12th   Highest education level: Not on file  Occupational History    Employer: UNEMPLOYED  Tobacco Use   Smoking status: Former    Packs/day: 3.00    Years: 32.00    Pack years: 96.00    Types: Cigarettes    Quit date: 10/24/1997    Years since quitting: 23.7   Smokeless tobacco: Never  Vaping Use   Vaping Use: Never used  Substance and Sexual Activity   Alcohol use: Not Currently    Comment: "might have 2-3 daiquiris in the summer"   Drug use: No   Sexual activity: Not Currently  Other Topics Concern   Not on file  Social History Narrative   Pt lives at home with her spouse.   Caffeine  Use- 3 sodas daily   Social Determinants of Health   Financial Resource Strain: Low Risk    Difficulty of Paying Living Expenses: Not very hard  Food Insecurity: No Food Insecurity   Worried About Charity fundraiser in the Last Year: Never true   Ran Out of Food in the Last Year: Never true  Transportation Needs: No  Transportation Needs   Lack of Transportation (Medical): No   Lack of Transportation (Non-Medical): No  Physical Activity: Not on file  Stress: Not on file  Social Connections: Not on file  Intimate Partner Violence: Not on file     Review of Systems     Objective:   Physical Exam Constitutional:      General: She is not in acute distress.    Appearance: Normal appearance. She is obese. She is not ill-appearing or toxic-appearing.  Cardiovascular:     Rate and Rhythm: Normal rate and regular rhythm.     Heart sounds: Normal heart sounds. No murmur heard.   No friction rub. No gallop.  Pulmonary:     Effort: Pulmonary effort is normal. No respiratory distress.     Breath sounds: Normal breath sounds. No stridor. No wheezing, rhonchi or rales.  Abdominal:     General: Bowel sounds are normal.     Palpations: Abdomen is soft.  Musculoskeletal:     Right lower leg: Edema present.     Left lower leg: Edema present.  Skin:    General: Skin is warm.     Findings: Erythema present.  Neurological:     Mental Status: She is alert.          Assessment & Plan:  Uncontrolled type 2 diabetes mellitus with hyperglycemia (HCC)  Chronic diastolic CHF (congestive heart failure) ([place]) - Plan: CBC with Differential/Platelet, COMPLETE METABOLIC PANEL WITH GFR  Venous ulcer of left leg (HCC) - Plan: CBC with Differential/Platelet, COMPLETE METABOLIC PANEL WITH GFR  Stage 4 chronic kidney disease ([place]) - Plan: CBC with Differential/Platelet, COMPLETE METABOLIC PANEL WITH GFR  Cellulitis of left lower extremity Begin by treating the cellulitis with doxycycline 100 mg p.o. twice daily for 7 days.  Recheck on Monday.  The cellulitis is responding to treatment, I will begin clinically compression therapy.  Meanwhile recommended to elevate legs and wear her compression.  Continue the diuretic.  Check CBC and CMP to monitor her white blood cell count as well as her electrolytes.

## 2021-07-12 LAB — CBC WITH DIFFERENTIAL/PLATELET
Absolute Monocytes: 756 cells/uL (ref 200–950)
Basophils Absolute: 65 cells/uL (ref 0–200)
Basophils Relative: 0.6 %
Eosinophils Absolute: 637 cells/uL — ABNORMAL HIGH (ref 15–500)
Eosinophils Relative: 5.9 %
HCT: 38.9 % (ref 35.0–45.0)
Hemoglobin: 12.1 g/dL (ref 11.7–15.5)
Lymphs Abs: 950 cells/uL (ref 850–3900)
MCH: 25.1 pg — ABNORMAL LOW (ref 27.0–33.0)
MCHC: 31.1 g/dL — ABNORMAL LOW (ref 32.0–36.0)
MCV: 80.5 fL (ref 80.0–100.0)
MPV: 13.3 fL — ABNORMAL HIGH (ref 7.5–12.5)
Monocytes Relative: 7 %
Neutro Abs: 8392 cells/uL — ABNORMAL HIGH (ref 1500–7800)
Neutrophils Relative %: 77.7 %
Platelets: 206 10*3/uL (ref 140–400)
RBC: 4.83 10*6/uL (ref 3.80–5.10)
RDW: 15.4 % — ABNORMAL HIGH (ref 11.0–15.0)
Total Lymphocyte: 8.8 %
WBC: 10.8 10*3/uL (ref 3.8–10.8)

## 2021-07-12 LAB — COMPLETE METABOLIC PANEL WITH GFR
AG Ratio: 1.2 (calc) (ref 1.0–2.5)
ALT: 16 U/L (ref 6–29)
AST: 15 U/L (ref 10–35)
Albumin: 3.6 g/dL (ref 3.6–5.1)
Alkaline phosphatase (APISO): 187 U/L — ABNORMAL HIGH (ref 37–153)
BUN/Creatinine Ratio: 23 (calc) — ABNORMAL HIGH (ref 6–22)
BUN: 67 mg/dL — ABNORMAL HIGH (ref 7–25)
CO2: 30 mmol/L (ref 20–32)
Calcium: 9.5 mg/dL (ref 8.6–10.4)
Chloride: 94 mmol/L — ABNORMAL LOW (ref 98–110)
Creat: 2.87 mg/dL — ABNORMAL HIGH (ref 0.60–1.00)
Globulin: 3 g/dL (calc) (ref 1.9–3.7)
Glucose, Bld: 181 mg/dL — ABNORMAL HIGH (ref 65–99)
Potassium: 4.2 mmol/L (ref 3.5–5.3)
Sodium: 136 mmol/L (ref 135–146)
Total Bilirubin: 0.5 mg/dL (ref 0.2–1.2)
Total Protein: 6.6 g/dL (ref 6.1–8.1)
eGFR: 17 mL/min/{1.73_m2} — ABNORMAL LOW (ref >=60)

## 2021-07-15 ENCOUNTER — Encounter: Payer: Self-pay | Admitting: Family Medicine

## 2021-07-15 ENCOUNTER — Ambulatory Visit (INDEPENDENT_AMBULATORY_CARE_PROVIDER_SITE_OTHER): Payer: HMO | Admitting: Family Medicine

## 2021-07-15 ENCOUNTER — Ambulatory Visit: Payer: HMO | Admitting: Family Medicine

## 2021-07-15 ENCOUNTER — Other Ambulatory Visit: Payer: Self-pay

## 2021-07-15 VITALS — BP 128/68 | HR 92 | Temp 97.9°F | Resp 18 | Ht 66.0 in | Wt 260.0 lb

## 2021-07-15 DIAGNOSIS — I5032 Chronic diastolic (congestive) heart failure: Secondary | ICD-10-CM | POA: Diagnosis not present

## 2021-07-15 DIAGNOSIS — L03116 Cellulitis of left lower limb: Secondary | ICD-10-CM | POA: Diagnosis not present

## 2021-07-15 NOTE — Progress Notes (Signed)
Subjective:    Patient ID: Susan [name], female    DOB: [birth year], 71 y.o.   MRN: [id number]  HPI  07/11/21 The patient's legs distal to the knee are swollen with +2 edema.  The skin is little splitting open on her anterior right skin and weeping serous.  She has a nickel sized lesion on her anterior left shin that is weeping bloody fluid.  There is a horizontal split/fissure in the superior lateral aspect of her left shin that is weeping serous fluid.  The surrounding skin in that area is erythematous and extremely tender indicating a secondary infection.  She was here today primarily for follow-up however she is concerned because of the increasing swelling and pain in both of her legs.  She is taking 80 mg twice daily of her diuretic.  Her blood sugars prior to this week were under 200.  However she states that as the leg is gotten infected her blood sugar is now over 400.  At that time, my plan was: Begin by treating the cellulitis with doxycycline 100 mg p.o. twice daily for 7 days.  Recheck on Monday.  The cellulitis is responding to treatment, I will begin clinically compression therapy.  Meanwhile recommended to elevate legs and wear her compression.  Continue the diuretic.  Check CBC and CMP to monitor her white blood cell count as well as her electrolytes. Office Visit on 07/11/2021  Component Date Value Ref Range Status   WBC 07/11/2021 10.8  3.8 - 10.8 Thousand/uL Final   RBC 07/11/2021 4.83  3.80 - 5.10 Million/uL Final   Hemoglobin 07/11/2021 12.1  11.7 - 15.5 g/dL Final   HCT 07/11/2021 38.9  35.0 - 45.0 % Final   MCV 07/11/2021 80.5  80.0 - 100.0 fL Final   MCH 07/11/2021 25.1 (L)  27.0 - 33.0 pg Final   MCHC 07/11/2021 31.1 (L)  32.0 - 36.0 g/dL Final   RDW 07/11/2021 15.4 (H)  11.0 - 15.0 % Final   Platelets 07/11/2021 206  140 - 400 Thousand/uL Final   MPV 07/11/2021 13.3 (H)  7.5 - 12.5 fL Final   Neutro Abs 07/11/2021 8,392 (H)  1,500 - 7,800 cells/uL Final   Lymphs Abs  07/11/2021 950  850 - 3,900 cells/uL Final   Absolute Monocytes 07/11/2021 756  200 - 950 cells/uL Final   Eosinophils Absolute 07/11/2021 637 (H)  15 - 500 cells/uL Final   Basophils Absolute 07/11/2021 65  0 - 200 cells/uL Final   Neutrophils Relative % 07/11/2021 77.7  % Final   Total Lymphocyte 07/11/2021 8.8  % Final   Monocytes Relative 07/11/2021 7.0  % Final   Eosinophils Relative 07/11/2021 5.9  % Final   Basophils Relative 07/11/2021 0.6  % Final   Glucose, Bld 07/11/2021 181 (H)  65 - 99 mg/dL Final   Comment: .            Fasting reference interval . For someone without known diabetes, a glucose value >125 mg/dL indicates that they may have diabetes and this should be confirmed with a follow-up test. .    BUN 07/11/2021 67 (H)  7 - 25 mg/dL Final   Creat 07/11/2021 2.87 (H)  0.60 - 1.00 mg/dL Final   eGFR 07/11/2021 17 (L)  > OR = 60 mL/min/1.88m Final   Comment: The eGFR is based on the CKD-EPI 2021 equation. To calculate  the new eGFR from a previous Creatinine or Cystatin C result, go to https://www.kidney.org/professionals/ kdoqi/gfr%5Fcalculator  BUN/Creatinine Ratio 07/11/2021 23 (H)  6 - 22 (calc) Final   Sodium 07/11/2021 136  135 - 146 mmol/L Final   Potassium 07/11/2021 4.2  3.5 - 5.3 mmol/L Final   Chloride 07/11/2021 94 (L)  98 - 110 mmol/L Final   CO2 07/11/2021 30  20 - 32 mmol/L Final   Calcium 07/11/2021 9.5  8.6 - 10.4 mg/dL Final   Total Protein 07/11/2021 6.6  6.1 - 8.1 g/dL Final   Albumin 07/11/2021 3.6  3.6 - 5.1 g/dL Final   Globulin 07/11/2021 3.0  1.9 - 3.7 g/dL (calc) Final   AG Ratio 07/11/2021 1.2  1.0 - 2.5 (calc) Final   Total Bilirubin 07/11/2021 0.5  0.2 - 1.2 mg/dL Final   Alkaline phosphatase (APISO) 07/11/2021 187 (H)  37 - 153 U/L Final   AST 07/11/2021 15  10 - 35 U/L Final   ALT 07/11/2021 16  6 - 29 U/L Final   07/15/21 Patient has +2 pitting edema in both legs today.  The skin is splitting open on the anterior shins and  multiple areas.  There are numerous areas that are 3 to 4 mm that are weeping serous fluid.  Both legs are hyperpigmented and erythematous.  She reports aching throbbing pain in both legs.  The area of cellulitis on the anterior left leg has improved however Past Medical History:  Diagnosis Date   Acute MI ([place]) 1999; 2007   Anemia    hx   Anginal pain ([place])    Anxiety    ARF (acute renal failure) ([place]) 06/2017   [employer] Asso   Arthritis    "generalized" (03/15/2014)   CAD (coronary artery disease)    MI in 2000 - MI  2007 - treated bare metal stent (no nuclear since then as 9/11)   Carotid artery disease (H[facility])    CHF (congestive heart failure) ([place])    Chronic diastolic heart failure ([place])    a) ECHO (08/2013) EF 55-60% and RV function nl b) RHC (08/2013) RA 4, RV 30/5/7, PA 25/10 (16), PCWP 7, Fick CO/CI 6.3/2.7, PVR 1.5 WU, PA 61 and 66%   Daily headache    "~ every other day; since I fell in June" (03/15/2014)   Depression    Dyslipidemia    Dyspnea    Exertional shortness of breath    HTN (hypertension)    Hypothyroidism    Neuropathy    Obesity    Osteoarthritis    Peripheral neuropathy    PONV (postoperative nausea and vomiting)    RBBB (right bundle branch block)    Old   Stroke ([place])    mini strokes   Syncope    likely due to low blood sugar   Tachycardia    Sinus tachycardia   Type II diabetes mellitus ([place])    Type II   Urinary incontinence    Venous insufficiency    Past Surgical History:  Procedure Laterality Date   ABDOMINAL HYSTERECTOMY  1980's   AMPUTATION Right 02/24/2018   Procedure: RIGHT FOOT GREAT TOE AND 2ND TOE AMPUTATION;  Surgeon: Newt Minion, MD;  Location: [facility];  Service: Orthopedics;  Laterality: Right;   AMPUTATION Right 04/30/2018   Procedure: RIGHT TRANSMETATARSAL AMPUTATION;  Surgeon: Newt Minion, MD;  Location: [facility];  Service: Orthopedics;  Laterality: Right;   BIOPSY  05/27/2020   Procedure: BIOPSY;  Surgeon: [name], DO;  [facility];  Service: Endoscopy;;   CATARACT  EXTRACTION, BILATERAL Bilateral ?2013   COLONOSCOPY W/ POLYPECTOMY     COLONOSCOPY WITH PROPOFOL N/A 03/13/2019   Procedure: COLONOSCOPY WITH PROPOFOL;  Surgeon: Jerene Bears, MD;  Location: [facility];  Service: Gastroenterology;  Laterality: N/A;   [place]; 2007   "1 + 1"   ESOPHAGOGASTRODUODENOSCOPY (EGD) WITH PROPOFOL N/A 03/13/2019   Procedure: ESOPHAGOGASTRODUODENOSCOPY (EGD) WITH PROPOFOL;  Surgeon: Jerene Bears, MD;  Location: [facility];  Service: Gastroenterology;  Laterality: N/A;   ESOPHAGOGASTRODUODENOSCOPY (EGD) WITH PROPOFOL N/A 05/27/2020   Procedure: ESOPHAGOGASTRODUODENOSCOPY (EGD) WITH PROPOFOL;  Surgeon: [name], DO;  [facility];  Service: Endoscopy;  Laterality: N/A;   EYE SURGERY Bilateral    lazer   HEMOSTASIS CLIP PLACEMENT  03/13/2019   Procedure: HEMOSTASIS CLIP PLACEMENT;  Surgeon: Jerene Bears, MD;  Location: [facility];  Service: Gastroenterology;;   KNEE ARTHROSCOPY Left 10/25/2006   POLYPECTOMY  03/13/2019   Procedure: POLYPECTOMY;  Surgeon: Jerene Bears, MD;  Location: [facility];  Service: Gastroenterology;;   RIGHT HEART CATH N/A 07/24/2017   Procedure: RIGHT HEART CATH;  Surgeon: Jolaine Artist, MD;  Location: [facility];  Service: Cardiovascular;  Laterality: N/A;   RIGHT HEART CATHETERIZATION N/A 09/22/2013   Procedure: RIGHT HEART CATH;  Surgeon: Jolaine Artist, MD;  Location: [facility];  Service: Cardiovascular;  Laterality: N/A;   SHOULDER ARTHROSCOPY WITH OPEN ROTATOR CUFF REPAIR Right 03/14/2014   Procedure: RIGHT SHOULDER ARTHROSCOPY WITH BICEPS RELEASE, OPEN SUBSCAPULA REPAIR, OPEN SUPRASPINATUS REPAIR.;  Surgeon: Meredith Pel, MD;  Location: [facility];  Service: Orthopedics;  Laterality: Right;   TOE AMPUTATION Right 02/24/2018   GREAT TOE AND 2ND TOE AMPUTATION   TUBAL LIGATION   1970's   Current Outpatient Medications on File Prior to Visit  Medication Sig Dispense Refill   acetaminophen (TYLENOL) 500 MG tablet Take 1,000 mg by mouth every 6 (six) hours as needed for moderate pain or headache.     albuterol (VENTOLIN HFA) 108 (90 Base) MCG/ACT inhaler TAKE 2 PUFFS BY MOUTH EVERY 6 HOURS AS NEEDED FOR WHEEZE OR SHORTNESS OF BREATH 8.5 g 1   allopurinol (ZYLOPRIM) 100 MG tablet Take 0.5 tablets (50 mg total) by mouth daily. 30 tablet 0   Ascorbic Acid (VITAMIN C) 1000 MG tablet Take 1,000 mg by mouth daily.     aspirin EC 81 MG tablet Take 1 tablet (81 mg total) by mouth daily with breakfast. 30 tablet 11   atorvastatin (LIPITOR) 80 MG tablet Take 1 tablet (80 mg total) by mouth daily. 30 tablet 5   carvedilol (COREG) 12.5 MG tablet TAKE 1 TABLET (12.5 MG TOTAL) BY MOUTH 2 (TWO) TIMES DAILY WITH A MEAL. 180 tablet 1   cholecalciferol (VITAMIN D) 1000 units tablet Take 1,000 Units by mouth daily with supper.      clopidogrel (PLAVIX) 75 MG tablet TAKE 1 TABLET BY MOUTH DAILY WITH BREAKFAST. 90 tablet 3   Cranberry 250 MG TABS Take 500 mg by mouth daily.     doxycycline (VIBRA-TABS) 100 MG tablet Take 1 tablet (100 mg total) by mouth 2 (two) times daily. 14 tablet 0   ferrous sulfate 325 (65 FE) MG tablet Take 1 tablet (325 mg total) by mouth daily with breakfast. Reported on 08/21/2015 60 tablet 2   guaiFENesin-dextromethorphan (ROBITUSSIN DM) 100-10 MG/5ML syrup Take 10 mLs by mouth every 4 (four) hours as needed for cough. 118 mL 0  hydrocortisone cream 0.5 % Apply 1 application topically 2 (two) times daily. 30 g 0   Insulin Disposable Pump (OMNIPOD DASH 5 PACK PODS) MISC Inject into the skin See admin instructions. Use continuously with Novolin R - change every 72 hours.     insulin NPH Human (HUMULIN N,NOVOLIN N) 100 UNIT/ML injection Inject 15 Units into the skin See admin instructions. Inject 15 units subcutaneously in the morning if CBG >300;     insulin regular  (NOVOLIN R) 100 units/mL injection Inject into the skin See admin instructions. Manually add bolus to continuous dose via OmniPod 3 times daily per sliding scale (CBG 80-150 7 units, 151-200 9 units, 201-250 12 units, 251-300 14 units, 301- 400 17 units)     isosorbide mononitrate (IMDUR) 30 MG 24 hr tablet Take 0.5 tablets (15 mg total) by mouth daily. 30 tablet 6   levothyroxine (SYNTHROID, LEVOTHROID) 50 MCG tablet Take 1 tablet (50 mcg total) by mouth daily before breakfast. 90 tablet 1   magnesium oxide (MAG-OX) 400 (240 Mg) MG tablet Take 1 tablet by mouth daily.     Multiple Vitamins-Minerals (AIRBORNE GUMMIES PO) Take 2 tablets by mouth every evening.     Multiple Vitamins-Minerals (CENTRUM SILVER 50+WOMEN) TABS Take 1 tablet by mouth every evening.     nitroGLYCERIN (NITROSTAT) 0.4 MG SL tablet PLACE 1 TABLET (0.4 MG TOTAL) UNDER THE TONGUE EVERY 5 (FIVE) MINUTES AS NEEDED FOR CHEST PAIN. 25 tablet 1   ONETOUCH VERIO test strip 3 (three) times daily.     polyethylene glycol (MIRALAX / GLYCOLAX) 17 g packet 1 packet mixed with 8 ounces of fluid     potassium chloride SA (KLOR-CON M20) 20 MEQ tablet Take 1 tablet (20 mEq total) by mouth 2 (two) times a week. Monday and Friday 30 tablet 1   pregabalin (LYRICA) 150 MG capsule Take 150 mg by mouth 2 (two) times daily.     sitaGLIPtin (JANUVIA) 25 MG tablet Take 1 tablet (25 mg total) by mouth daily. 90 tablet 3   torsemide (DEMADEX) 20 MG tablet Take 80 mg by mouth 2 (two) times daily.     ursodiol (ACTIGALL) 500 MG tablet Take 500 mg by mouth 3 (three) times daily.     vitamin B-12 (CYANOCOBALAMIN) 1000 MCG tablet Take 1,000 mcg by mouth daily.     vortioxetine HBr (TRINTELLIX) 10 MG TABS tablet Take 1 tablet (10 mg total) by mouth daily. 30 tablet 5   zinc gluconate 50 MG tablet Take 50 mg by mouth daily.     metolazone (ZAROXOLYN) 2.5 MG tablet Take 1 tablet (2.5 mg total) by mouth daily. 90 tablet 3   No current facility-administered  medications on file prior to visit.   Allergies  Allergen Reactions   Drug Ingredient [Cephalexin] Diarrhea    Pt reports heavy diarrhea with use of keflex   Codeine Nausea And Vomiting and Other (See Comments)   Social History   Socioeconomic History   Marital status: Married    Spouse name: Not on file   Number of children: 3   Years of education: 12th   Highest education level: Not on file  Occupational History    Employer: UNEMPLOYED  Tobacco Use   Smoking status: Former    Packs/day: 3.00    Years: 32.00    Pack years: 96.00    Types: Cigarettes    Quit date: 10/24/1997    Years since quitting: 23.7   Smokeless tobacco: Never  Vaping Use  Vaping Use: Never used  Substance and Sexual Activity   Alcohol use: Not Currently    Comment: "might have 2-3 daiquiris in the summer"   Drug use: No   Sexual activity: Not Currently  Other Topics Concern   Not on file  Social History Narrative   Pt lives at home with her spouse.   Caffeine Use- 3 sodas daily   Social Determinants of Health   Financial Resource Strain: Low Risk    Difficulty of Paying Living Expenses: Not very hard  Food Insecurity: No Food Insecurity   Worried About Charity fundraiser in the Last Year: Never true   Ran Out of Food in the Last Year: Never true  Transportation Needs: No Transportation Needs   Lack of Transportation (Medical): No   Lack of Transportation (Non-Medical): No  Physical Activity: Not on file  Stress: Not on file  Social Connections: Not on file  Intimate Partner Violence: Not on file     Review of Systems     Objective:   Physical Exam Constitutional:      General: She is not in acute distress.    Appearance: Normal appearance. She is obese. She is not ill-appearing or toxic-appearing.  Cardiovascular:     Rate and Rhythm: Normal rate and regular rhythm.     Heart sounds: Normal heart sounds. No murmur heard.   No friction rub. No gallop.  Pulmonary:     Effort:  Pulmonary effort is normal. No respiratory distress.     Breath sounds: Normal breath sounds. No stridor. No wheezing, rhonchi or rales.  Abdominal:     General: Bowel sounds are normal.     Palpations: Abdomen is soft.  Musculoskeletal:     Right lower leg: Edema present.     Left lower leg: Edema present.  Skin:    General: Skin is warm.     Findings: Erythema present.  Neurological:     Mental Status: She is alert.          Assessment & Plan:  Chronic diastolic CHF (congestive heart failure) (HCC)  Cellulitis of left lower extremity Increase diuretic to 100 mg twice daily.  Placed the patient in Unna boots bilaterally.  Recheck here on Thursday.  I believe the venous stasis ulcers are due to a combination of fluid overload as well as noncompliance with compression therapy.  Hopefully increasing the diuretic and Unna boots will help decrease the swelling in the leg to facilitate healing of the venous stasis ulcers

## 2021-07-18 ENCOUNTER — Other Ambulatory Visit: Payer: Self-pay

## 2021-07-18 ENCOUNTER — Ambulatory Visit: Payer: HMO | Admitting: Family Medicine

## 2021-07-18 DIAGNOSIS — I5032 Chronic diastolic (congestive) heart failure: Secondary | ICD-10-CM | POA: Diagnosis not present

## 2021-07-18 MED ORDER — MAGNESIUM OXIDE -MG SUPPLEMENT 400 (240 MG) MG PO TABS: 1.0000 | ORAL_TABLET | Freq: Every day | ORAL | 3 refills | Status: DC

## 2021-07-19 ENCOUNTER — Encounter: Payer: Self-pay | Admitting: Family Medicine

## 2021-07-19 ENCOUNTER — Other Ambulatory Visit: Payer: Self-pay

## 2021-07-19 ENCOUNTER — Ambulatory Visit (INDEPENDENT_AMBULATORY_CARE_PROVIDER_SITE_OTHER): Payer: HMO | Admitting: Family Medicine

## 2021-07-19 VITALS — BP 142/62 | HR 88 | Temp 97.9°F | Resp 18 | Ht 66.0 in | Wt 252.0 lb

## 2021-07-19 DIAGNOSIS — L97929 Non-pressure chronic ulcer of unspecified part of left lower leg with unspecified severity: Secondary | ICD-10-CM | POA: Diagnosis not present

## 2021-07-19 DIAGNOSIS — R197 Diarrhea, unspecified: Secondary | ICD-10-CM

## 2021-07-19 DIAGNOSIS — I5032 Chronic diastolic (congestive) heart failure: Secondary | ICD-10-CM | POA: Diagnosis not present

## 2021-07-19 DIAGNOSIS — N184 Chronic kidney disease, stage 4 (severe): Secondary | ICD-10-CM

## 2021-07-19 DIAGNOSIS — I83029 Varicose veins of left lower extremity with ulcer of unspecified site: Secondary | ICD-10-CM

## 2021-07-19 MED ORDER — DIPHENOXYLATE-ATROPINE 2.5-0.025 MG PO TABS: 1.0000 | ORAL_TABLET | Freq: Four times a day (QID) | ORAL | 0 refills | Status: DC | PRN

## 2021-07-19 NOTE — Progress Notes (Signed)
Subjective:    Patient ID: Susan [name], female    DOB: Oct 20, 1950, 71 y.o.   MRN: [id number]  Dizziness  Diarrhea    07/11/21 The patient's legs distal to the knee are swollen with +2 edema.  The skin is little splitting open on her anterior right skin and weeping serous.  She has a nickel sized lesion on her anterior left shin that is weeping bloody fluid.  There is a horizontal split/fissure in the superior lateral aspect of her left shin that is weeping serous fluid.  The surrounding skin in that area is erythematous and extremely tender indicating a secondary infection.  She was here today primarily for follow-up however she is concerned because of the increasing swelling and pain in both of her legs.  She is taking 80 mg twice daily of her diuretic.  Her blood sugars prior to this week were under 200.  However she states that as the leg is gotten infected her blood sugar is now over 400.  At that time, my plan was: Begin by treating the cellulitis with doxycycline 100 mg p.o. twice daily for 7 days.  Recheck on Monday.  The cellulitis is responding to treatment, I will begin clinically compression therapy.  Meanwhile recommended to elevate legs and wear her compression.  Continue the diuretic.  Check CBC and CMP to monitor her white blood cell count as well as her electrolytes. No visits with results within 1 Week(s) from this visit.  Latest known visit with results is:  Office Visit on 07/11/2021  Component Date Value Ref Range Status   WBC 07/11/2021 10.8  3.8 - 10.8 Thousand/uL Final   RBC 07/11/2021 4.83  3.80 - 5.10 Million/uL Final   Hemoglobin 07/11/2021 12.1  11.7 - 15.5 g/dL Final   HCT 07/11/2021 38.9  35.0 - 45.0 % Final   MCV 07/11/2021 80.5  80.0 - 100.0 fL Final   MCH 07/11/2021 25.1 (L)  27.0 - 33.0 pg Final   MCHC 07/11/2021 31.1 (L)  32.0 - 36.0 g/dL Final   RDW 07/11/2021 15.4 (H)  11.0 - 15.0 % Final   Platelets 07/11/2021 206  140 - 400 Thousand/uL Final   MPV  07/11/2021 13.3 (H)  7.5 - 12.5 fL Final   Neutro Abs 07/11/2021 8,392 (H)  1,500 - 7,800 cells/uL Final   Lymphs Abs 07/11/2021 950  850 - 3,900 cells/uL Final   Absolute Monocytes 07/11/2021 756  200 - 950 cells/uL Final   Eosinophils Absolute 07/11/2021 637 (H)  15 - 500 cells/uL Final   Basophils Absolute 07/11/2021 65  0 - 200 cells/uL Final   Neutrophils Relative % 07/11/2021 77.7  % Final   Total Lymphocyte 07/11/2021 8.8  % Final   Monocytes Relative 07/11/2021 7.0  % Final   Eosinophils Relative 07/11/2021 5.9  % Final   Basophils Relative 07/11/2021 0.6  % Final   Glucose, Bld 07/11/2021 181 (H)  65 - 99 mg/dL Final   Comment: .            Fasting reference interval . For someone without known diabetes, a glucose value >125 mg/dL indicates that they may have diabetes and this should be confirmed with a follow-up test. .    BUN 07/11/2021 67 (H)  7 - 25 mg/dL Final   Creat 07/11/2021 2.87 (H)  0.60 - 1.00 mg/dL Final   eGFR 07/11/2021 17 (L)  > OR = 60 mL/min/1.37m Final   Comment: The eGFR is based on the  CKD-EPI 2021 equation. To calculate  the new eGFR from a previous Creatinine or Cystatin C result, go to https://www.kidney.org/professionals/ kdoqi/gfr%5Fcalculator    BUN/Creatinine Ratio 07/11/2021 23 (H)  6 - 22 (calc) Final   Sodium 07/11/2021 136  135 - 146 mmol/L Final   Potassium 07/11/2021 4.2  3.5 - 5.3 mmol/L Final   Chloride 07/11/2021 94 (L)  98 - 110 mmol/L Final   CO2 07/11/2021 30  20 - 32 mmol/L Final   Calcium 07/11/2021 9.5  8.6 - 10.4 mg/dL Final   Total Protein 07/11/2021 6.6  6.1 - 8.1 g/dL Final   Albumin 07/11/2021 3.6  3.6 - 5.1 g/dL Final   Globulin 07/11/2021 3.0  1.9 - 3.7 g/dL (calc) Final   AG Ratio 07/11/2021 1.2  1.0 - 2.5 (calc) Final   Total Bilirubin 07/11/2021 0.5  0.2 - 1.2 mg/dL Final   Alkaline phosphatase (APISO) 07/11/2021 187 (H)  37 - 153 U/L Final   AST 07/11/2021 15  10 - 35 U/L Final   ALT 07/11/2021 16  6 - 29 U/L  Final   07/15/21 Patient has +2 pitting edema in both legs today.  The skin is splitting open on the anterior shins and multiple areas.  There are numerous areas that are 3 to 4 mm that are weeping serous fluid.  Both legs are hyperpigmented and erythematous.  She reports aching throbbing pain in both legs.  The area of cellulitis on the anterior left leg has improved however.  AT that time, my plan was:  Increase diuretic to 100 mg twice daily.  Placed the patient in Unna boots bilaterally.  Recheck here on Thursday.  I believe the venous stasis ulcers are due to a combination of fluid overload as well as noncompliance with compression therapy.  Hopefully increasing the diuretic and Unna boots will help decrease the swelling in the leg to facilitate healing of the venous stasis ulcers  07/19/21 Unfortunately after the last time I saw the patient she developed terrible diarrhea.  She states that she is going to the bathroom 5-10 times per day.  That coupled with increasing her diuretic to 100 mg twice daily has caused her to lose 8 pounds!  She is down from 260 to 252 pounds.  As result she feels lightheaded and dizzy and weak and tired.  She denies any fevers or chills.  She denies any chest pain shortness of breath.  She denies any nausea or vomiting or abdominal pain.  She denies any blood in her stool. Past Medical History:  Diagnosis Date   Acute MI ([place]) 1999; 2007   Anemia    hx   Anginal pain ([place])    Anxiety    ARF (acute renal failure) ([place]) 06/2017   [facility] Asso   Arthritis    "generalized" (03/15/2014)   CAD (coronary artery disease)    MI in 2000 - MI  2007 - treated bare metal stent (no nuclear since then as 9/11)   Carotid artery disease (HCC)    CHF (congestive heart failure) (HCC)    Chronic diastolic heart failure ([place])    a) ECHO (08/2013) EF 55-60% and RV function nl b) RHC (08/2013) RA 4, RV 30/5/7, PA 25/10 (16), PCWP 7, Fick CO/CI 6.3/2.7, PVR 1.5 WU, PA 61 and  66%   Daily headache    "~ every other day; since I fell in June" (03/15/2014)   Depression    Dyslipidemia    Dyspnea  Exertional shortness of breath    HTN (hypertension)    Hypothyroidism    Neuropathy    Obesity    Osteoarthritis    Peripheral neuropathy    PONV (postoperative nausea and vomiting)    RBBB (right bundle branch block)    Old   Stroke ([place])    mini strokes   Syncope    likely due to low blood sugar   Tachycardia    Sinus tachycardia   Type II diabetes mellitus ([place])    Type II   Urinary incontinence    Venous insufficiency    Past Surgical History:  Procedure Laterality Date   ABDOMINAL HYSTERECTOMY  1980's   AMPUTATION Right 02/24/2018   Procedure: RIGHT FOOT GREAT TOE AND 2ND TOE AMPUTATION;  Surgeon: Newt Minion, MD;  Location: [place];  Service: Orthopedics;  Laterality: Right;   AMPUTATION Right 04/30/2018   Procedure: RIGHT TRANSMETATARSAL AMPUTATION;  Surgeon: Newt Minion, MD;  Location: [place];  Service: Orthopedics;  Laterality: Right;   BIOPSY  05/27/2020   Procedure: BIOPSY;  Surgeon: [name], DO;  [facility];  Service: Endoscopy;;   CATARACT EXTRACTION, BILATERAL Bilateral ?2013   COLONOSCOPY W/ POLYPECTOMY     COLONOSCOPY WITH PROPOFOL N/A 03/13/2019   Procedure: COLONOSCOPY WITH PROPOFOL;  Surgeon: Jerene Bears, MD;  Location: [place];  Service: Gastroenterology;  Laterality: N/A;   [place]; 2007   "1 + 1"   ESOPHAGOGASTRODUODENOSCOPY (EGD) WITH PROPOFOL N/A 03/13/2019   Procedure: ESOPHAGOGASTRODUODENOSCOPY (EGD) WITH PROPOFOL;  Surgeon: Jerene Bears, MD;  Location: [place];  Service: Gastroenterology;  Laterality: N/A;   ESOPHAGOGASTRODUODENOSCOPY (EGD) WITH PROPOFOL N/A 05/27/2020   Procedure: ESOPHAGOGASTRODUODENOSCOPY (EGD) WITH PROPOFOL;  Surgeon: [name], DO;  [facility];  Service: Endoscopy;  Laterality: N/A;   EYE SURGERY Bilateral     lazer   HEMOSTASIS CLIP PLACEMENT  03/13/2019   Procedure: HEMOSTASIS CLIP PLACEMENT;  Surgeon: Jerene Bears, MD;  Location[facility] ENDOSCOPY;  Service: Gastroenterology;;   KNEE ARTHROSCOPY Left 10/25/2006   POLYPECTOMY  03/13/2019   Procedure: POLYPECTOMY;  Surgeon: Jerene Bears, MD;  Location: [place];  Service: Gastroenterology;;   RIGHT HEART CATH N/A 07/24/2017   Procedure: RIGHT HEART CATH;  Surgeon: Jolaine Artist, MD;  Location: [place] [facility];  Service: Cardiovascular;  Laterality: N/A;   RIGHT HEART CATHETERIZATION N/A 09/22/2013   Procedure: RIGHT HEART CATH;  Surgeon: Jolaine Artist, MD;  Location: [facility];  Service: Cardiovascular;  Laterality: N/A;   SHOULDER ARTHROSCOPY WITH OPEN ROTATOR CUFF REPAIR Right 03/14/2014   Procedure: RIGHT SHOULDER ARTHROSCOPY WITH BICEPS RELEASE, OPEN SUBSCAPULA REPAIR, OPEN SUPRASPINATUS REPAIR.;  Surgeon: Meredith Pel, MD;  Location: [place];  Service: Orthopedics;  Laterality: Right;   TOE AMPUTATION Right 02/24/2018   GREAT TOE AND 2ND TOE AMPUTATION   TUBAL LIGATION  1970's   Current Outpatient Medications on File Prior to Visit  Medication Sig Dispense Refill   acetaminophen (TYLENOL) 500 MG tablet Take 1,000 mg by mouth every 6 (six) hours as needed for moderate pain or headache.     albuterol (VENTOLIN HFA) 108 (90 Base) MCG/ACT inhaler TAKE 2 PUFFS BY MOUTH EVERY 6 HOURS AS NEEDED FOR WHEEZE OR SHORTNESS OF BREATH 8.5 g 1   allopurinol (ZYLOPRIM) 100 MG tablet Take 0.5 tablets (50 mg total) by mouth daily. 30 tablet 0   Ascorbic Acid (VITAMIN C)  1000 MG tablet Take 1,000 mg by mouth daily.     aspirin EC 81 MG tablet Take 1 tablet (81 mg total) by mouth daily with breakfast. 30 tablet 11   atorvastatin (LIPITOR) 80 MG tablet Take 1 tablet (80 mg total) by mouth daily. 30 tablet 5   carvedilol (COREG) 12.5 MG tablet TAKE 1 TABLET (12.5 MG TOTAL) BY MOUTH 2 (TWO) TIMES DAILY WITH A MEAL. 180 tablet 1   cholecalciferol  (VITAMIN D) 1000 units tablet Take 1,000 Units by mouth daily with supper.      clopidogrel (PLAVIX) 75 MG tablet TAKE 1 TABLET BY MOUTH DAILY WITH BREAKFAST. 90 tablet 3   Cranberry 250 MG TABS Take 500 mg by mouth daily.     doxycycline (VIBRA-TABS) 100 MG tablet Take 1 tablet (100 mg total) by mouth 2 (two) times daily. 14 tablet 0   ferrous sulfate 325 (65 FE) MG tablet Take 1 tablet (325 mg total) by mouth daily with breakfast. Reported on 08/21/2015 60 tablet 2   guaiFENesin-dextromethorphan (ROBITUSSIN DM) 100-10 MG/5ML syrup Take 10 mLs by mouth every 4 (four) hours as needed for cough. 118 mL 0   hydrocortisone cream 0.5 % Apply 1 application topically 2 (two) times daily. 30 g 0   Insulin Disposable Pump (OMNIPOD DASH 5 PACK PODS) MISC Inject into the skin See admin instructions. Use continuously with Novolin R - change every 72 hours.     insulin NPH Human (HUMULIN N,NOVOLIN N) 100 UNIT/ML injection Inject 15 Units into the skin See admin instructions. Inject 15 units subcutaneously in the morning if CBG >300;     insulin regular (NOVOLIN R) 100 units/mL injection Inject into the skin See admin instructions. Manually add bolus to continuous dose via OmniPod 3 times daily per sliding scale (CBG 80-150 7 units, 151-200 9 units, 201-250 12 units, 251-300 14 units, 301- 400 17 units)     isosorbide mononitrate (IMDUR) 30 MG 24 hr tablet Take 0.5 tablets (15 mg total) by mouth daily. 30 tablet 6   levothyroxine (SYNTHROID, LEVOTHROID) 50 MCG tablet Take 1 tablet (50 mcg total) by mouth daily before breakfast. 90 tablet 1   magnesium oxide (MAG-OX) 400 (240 Mg) MG tablet Take 1 tablet (400 mg total) by mouth daily. 90 tablet 3   Multiple Vitamins-Minerals (AIRBORNE GUMMIES PO) Take 2 tablets by mouth every evening.     Multiple Vitamins-Minerals (CENTRUM SILVER 50+WOMEN) TABS Take 1 tablet by mouth every evening.     nitroGLYCERIN (NITROSTAT) 0.4 MG SL tablet PLACE 1 TABLET (0.4 MG TOTAL) UNDER  THE TONGUE EVERY 5 (FIVE) MINUTES AS NEEDED FOR CHEST PAIN. 25 tablet 1   ONETOUCH VERIO test strip 3 (three) times daily.     polyethylene glycol (MIRALAX / GLYCOLAX) 17 g packet 1 packet mixed with 8 ounces of fluid     potassium chloride SA (KLOR-CON M20) 20 MEQ tablet Take 1 tablet (20 mEq total) by mouth 2 (two) times a week. Monday and Friday 30 tablet 1   pregabalin (LYRICA) 150 MG capsule Take 150 mg by mouth 2 (two) times daily.     sitaGLIPtin (JANUVIA) 25 MG tablet Take 1 tablet (25 mg total) by mouth daily. 90 tablet 3   torsemide (DEMADEX) 20 MG tablet Take 80 mg by mouth 2 (two) times daily.     ursodiol (ACTIGALL) 500 MG tablet Take 500 mg by mouth 3 (three) times daily.     vitamin B-12 (CYANOCOBALAMIN) 1000 MCG tablet Take  1,000 mcg by mouth daily.     vortioxetine HBr (TRINTELLIX) 10 MG TABS tablet Take 1 tablet (10 mg total) by mouth daily. 30 tablet 5   zinc gluconate 50 MG tablet Take 50 mg by mouth daily.     metolazone (ZAROXOLYN) 2.5 MG tablet Take 1 tablet (2.5 mg total) by mouth daily. 90 tablet 3   No current facility-administered medications on file prior to visit.   Allergies  Allergen Reactions   Drug Ingredient [Cephalexin] Diarrhea    Pt reports heavy diarrhea with use of keflex   Codeine Nausea And Vomiting and Other (See Comments)   Social History   Socioeconomic History   Marital status: Married    Spouse name: Not on file   Number of children: 3   Years of education: 12th   Highest education level: Not on file  Occupational History    Employer: UNEMPLOYED  Tobacco Use   Smoking status: Former    Packs/day: 3.00    Years: 32.00    Pack years: 96.00    Types: Cigarettes    Quit date: 10/24/1997    Years since quitting: 23.7   Smokeless tobacco: Never  Vaping Use   Vaping Use: Never used  Substance and Sexual Activity   Alcohol use: Not Currently    Comment: "might have 2-3 daiquiris in the summer"   Drug use: No   Sexual activity: Not  Currently  Other Topics Concern   Not on file  Social History Narrative   Pt lives at home with her spouse.   Caffeine Use- 3 sodas daily   Social Determinants of Health   Financial Resource Strain: Low Risk    Difficulty of Paying Living Expenses: Not very hard  Food Insecurity: No Food Insecurity   Worried About Charity fundraiser in the Last Year: Never true   Ran Out of Food in the Last Year: Never true  Transportation Needs: No Transportation Needs   Lack of Transportation (Medical): No   Lack of Transportation (Non-Medical): No  Physical Activity: Not on file  Stress: Not on file  Social Connections: Not on file  Intimate Partner Violence: Not on file     Review of Systems  Gastrointestinal:  Positive for diarrhea.  Neurological:  Positive for dizziness.      Objective:   Physical Exam Constitutional:      General: She is not in acute distress.    Appearance: Normal appearance. She is obese. She is not ill-appearing or toxic-appearing.  Cardiovascular:     Rate and Rhythm: Normal rate and regular rhythm.     Heart sounds: Normal heart sounds. No murmur heard.   No friction rub. No gallop.  Pulmonary:     Effort: Pulmonary effort is normal. No respiratory distress.     Breath sounds: Normal breath sounds. No stridor. No wheezing, rhonchi or rales.  Abdominal:     General: Bowel sounds are normal.     Palpations: Abdomen is soft.  Musculoskeletal:     Right lower leg: Edema present.     Left lower leg: Edema present.  Skin:    General: Skin is warm.     Findings: Erythema present.  Neurological:     Mental Status: She is alert.     4 Small areas open on the anterior surface of the left leg but they are no longer weeping and they are extremely shallow.  There is 1 lesion on the medial aspect of the left shin  that is approximately 8 mm in diameter.  After I removed the gauze it is bleeding slightly.  She has 3 openings on the anterior surface of her right  leg.  All are less than 8 mm in diameter.  None of them are draining serous fluid anymore.  There is no evidence of cellulitis     Assessment & Plan:  Chronic diastolic CHF (congestive heart failure) (HCC)  Stage 4 chronic kidney disease (HCC)  Venous ulcer of left leg (HCC)  Diarrhea, unspecified type Unfortunately I feel the patient is very dehydrated due to the severe diarrhea coupled with the increased dose of the diuretic.  Therefore I will start her on Lomotil 2 tablets every 6 hours as needed for diarrhea and she is directed to decrease her diuretic to 40 mg twice a day.  I rewrapped both legs with Unna boots and I will recheck the patient first of next week.  Wounds are slowly healing and swelling has improved in both legs but I am concerned about the diarrhea and the weight loss and the dehydration.  Seek medical attention over the weekend if worsening

## 2021-07-21 ENCOUNTER — Other Ambulatory Visit (HOSPITAL_COMMUNITY): Payer: Self-pay | Admitting: Adult Health

## 2021-07-23 ENCOUNTER — Encounter: Payer: Self-pay | Admitting: Family Medicine

## 2021-07-23 ENCOUNTER — Other Ambulatory Visit: Payer: Self-pay

## 2021-07-23 ENCOUNTER — Ambulatory Visit (INDEPENDENT_AMBULATORY_CARE_PROVIDER_SITE_OTHER): Payer: HMO | Admitting: Family Medicine

## 2021-07-23 VITALS — BP 118/68 | HR 82 | Temp 97.2°F | Resp 18 | Ht 66.0 in | Wt 252.0 lb

## 2021-07-23 DIAGNOSIS — E1142 Type 2 diabetes mellitus with diabetic polyneuropathy: Secondary | ICD-10-CM

## 2021-07-23 DIAGNOSIS — I872 Venous insufficiency (chronic) (peripheral): Secondary | ICD-10-CM

## 2021-07-23 DIAGNOSIS — N184 Chronic kidney disease, stage 4 (severe): Secondary | ICD-10-CM

## 2021-07-23 DIAGNOSIS — Z794 Long term (current) use of insulin: Secondary | ICD-10-CM

## 2021-07-23 DIAGNOSIS — I5032 Chronic diastolic (congestive) heart failure: Secondary | ICD-10-CM | POA: Diagnosis not present

## 2021-07-23 DIAGNOSIS — L97811 Non-pressure chronic ulcer of other part of right lower leg limited to breakdown of skin: Secondary | ICD-10-CM

## 2021-07-23 NOTE — Progress Notes (Signed)
Subjective:    Patient ID: Susan [name], female    DOB: August 27, 1950, 71 y.o.   MRN: [id number]  Patient is here today to recheck the venous stasis ulcers on her lower legs.  When I last saw the patient, she appeared dehydrated.  I thought this was due to increasing her diuretic 100 mg twice a day and also did copious diarrhea.  Therefore I recommended that she decrease her diuretic to 40 mg twice daily and also gave her Lomotil to take for diarrhea.  She is here today for follow-up. Wt Readings from Last 3 Encounters:  07/23/21 252 lb (114.3 kg)  07/19/21 252 lb (114.3 kg)  07/15/21 260 lb (117.9 kg)   She has not lost any weight from her last visit.  Her weight is maintaining.  Patient states she feels better.  She is not dizzy.  She denies feeling weak tired today.  The diarrhea that started.  The wounds on her left leg are essentially healed.  There are still 2 small ulcers on her right leg that are still draining.  See the photograph below.:  As shown above, there are 2 punctate areas of bleeding on the anterior right shin that are still oozing that have not completely healed.  The left shin appears to be back to normal.  The wounds on the left shin have completely closed. Past Medical History:  Diagnosis Date   Acute MI ([place]) 1999; 2007   Anemia    hx   Anginal pain ([place])    Anxiety    ARF (acute renal failure) ([place]) 06/2017   [facility]   Arthritis    "generalized" (03/15/2014)   CAD (coronary artery disease)    MI in 2000 - MI  2007 - treated bare metal stent (no nuclear since then as 9/11)   Carotid artery disease (HCC)    CHF (congestive heart failure) ([place])    Chronic diastolic heart failure ([place])    a) ECHO (08/2013) EF 55-60% and RV function nl b) RHC (08/2013) RA 4, RV 30/5/7, PA 25/10 (16), PCWP 7, Fick CO/CI 6.3/2.7, PVR 1.5 WU, PA 61 and 66%   Daily headache    "~ every other day; since I fell in June" (03/15/2014)   Depression    Dyslipidemia    Dyspnea     Exertional shortness of breath    HTN (hypertension)    Hypothyroidism    Neuropathy    Obesity    Osteoarthritis    Peripheral neuropathy    PONV (postoperative nausea and vomiting)    RBBB (right bundle branch block)    Old   Stroke ([place])    mini strokes   Syncope    likely due to low blood sugar   Tachycardia    Sinus tachycardia   Type II diabetes mellitus ([place])    Type II   Urinary incontinence    Venous insufficiency    Past Surgical History:  Procedure Laterality Date   ABDOMINAL HYSTERECTOMY  1980's   AMPUTATION Right 02/24/2018   Procedure: RIGHT FOOT GREAT TOE AND 2ND TOE AMPUTATION;  Surgeon: Newt Minion, MD;  Location: [place];  Service: Orthopedics;  Laterality: Right;   AMPUTATION Right 04/30/2018   Procedure: RIGHT TRANSMETATARSAL AMPUTATION;  Surgeon: Newt Minion, MD;  Location: [place];  Service: Orthopedics;  Laterality: Right;   BIOPSY  05/27/2020   Procedure: BIOPSY;  Surgeon: [name], DO;  [facility];  Service: Endoscopy;;  CATARACT EXTRACTION, BILATERAL Bilateral ?2013   COLONOSCOPY W/ POLYPECTOMY     COLONOSCOPY WITH PROPOFOL N/A 03/13/2019   Procedure: COLONOSCOPY WITH PROPOFOL;  Surgeon: Jerene Bears, MD;  Location: [place];  Service: Gastroenterology;  Laterality: N/A;   [place]; 2007   "1 + 1"   ESOPHAGOGASTRODUODENOSCOPY (EGD) WITH PROPOFOL N/A 03/13/2019   Procedure: ESOPHAGOGASTRODUODENOSCOPY (EGD) WITH PROPOFOL;  Surgeon: Jerene Bears, MD;  Location: [place];  Service: Gastroenterology;  Laterality: N/A;   ESOPHAGOGASTRODUODENOSCOPY (EGD) WITH PROPOFOL N/A 05/27/2020   Procedure: ESOPHAGOGASTRODUODENOSCOPY (EGD) WITH PROPOFOL;  Surgeon: [name], DO;  [facility];  Service: Endoscopy;  Laterality: N/A;   EYE SURGERY Bilateral    lazer   HEMOSTASIS CLIP PLACEMENT  03/13/2019   Procedure: HEMOSTASIS CLIP PLACEMENT;  Surgeon: Jerene Bears, MD;  Location:  [facility] ENDOSCOPY;  Service: Gastroenterology;;   KNEE ARTHROSCOPY Left 10/25/2006   POLYPECTOMY  03/13/2019   Procedure: POLYPECTOMY;  Surgeon: Jerene Bears, MD;  Location: [place];  Service: Gastroenterology;;   RIGHT HEART CATH N/A 07/24/2017   Procedure: RIGHT HEART CATH;  Surgeon: Jolaine Artist, MD;  Location: [place] [facility];  Service: Cardiovascular;  Laterality: N/A;   RIGHT HEART CATHETERIZATION N/A 09/22/2013   Procedure: RIGHT HEART CATH;  Surgeon: Jolaine Artist, MD;  Location: [facility] CATH LAB;  Service: Cardiovascular;  Laterality: N/A;   SHOULDER ARTHROSCOPY WITH OPEN ROTATOR CUFF REPAIR Right 03/14/2014   Procedure: RIGHT SHOULDER ARTHROSCOPY WITH BICEPS RELEASE, OPEN SUBSCAPULA REPAIR, OPEN SUPRASPINATUS REPAIR.;  Surgeon: Meredith Pel, MD;  Location: [place];  Service: Orthopedics;  Laterality: Right;   TOE AMPUTATION Right 02/24/2018   GREAT TOE AND 2ND TOE AMPUTATION   TUBAL LIGATION  1970's   Current Outpatient Medications on File Prior to Visit  Medication Sig Dispense Refill   acetaminophen (TYLENOL) 500 MG tablet Take 1,000 mg by mouth every 6 (six) hours as needed for moderate pain or headache.     albuterol (VENTOLIN HFA) 108 (90 Base) MCG/ACT inhaler TAKE 2 PUFFS BY MOUTH EVERY 6 HOURS AS NEEDED FOR WHEEZE OR SHORTNESS OF BREATH 8.5 g 1   allopurinol (ZYLOPRIM) 100 MG tablet Take 0.5 tablets (50 mg total) by mouth daily. 30 tablet 0   Ascorbic Acid (VITAMIN C) 1000 MG tablet Take 1,000 mg by mouth daily.     aspirin EC 81 MG tablet Take 1 tablet (81 mg total) by mouth daily with breakfast. 30 tablet 11   atorvastatin (LIPITOR) 80 MG tablet Take 1 tablet (80 mg total) by mouth daily. 30 tablet 5   carvedilol (COREG) 12.5 MG tablet TAKE 1 TABLET (12.5 MG TOTAL) BY MOUTH 2 (TWO) TIMES DAILY WITH A MEAL. 180 tablet 1   cholecalciferol (VITAMIN D) 1000 units tablet Take 1,000 Units by mouth daily with supper.      clopidogrel (PLAVIX) 75 MG tablet TAKE 1 TABLET  BY MOUTH EVERY DAY WITH BREAKFAST 90 tablet 3   Cranberry 250 MG TABS Take 500 mg by mouth daily.     diphenoxylate-atropine (LOMOTIL) 2.5-0.025 MG tablet Take 1 tablet by mouth 4 (four) times daily as needed for diarrhea or loose stools. 30 tablet 0   ferrous sulfate 325 (65 FE) MG tablet Take 1 tablet (325 mg total) by mouth daily with breakfast. Reported on 08/21/2015 60 tablet 2   guaiFENesin-dextromethorphan (ROBITUSSIN DM) 100-10 MG/5ML syrup Take 10 mLs by mouth every 4 (four) hours as needed  for cough. 118 mL 0   hydrocortisone cream 0.5 % Apply 1 application topically 2 (two) times daily. 30 g 0   Insulin Disposable Pump (OMNIPOD DASH 5 PACK PODS) MISC Inject into the skin See admin instructions. Use continuously with Novolin R - change every 72 hours.     insulin NPH Human (HUMULIN N,NOVOLIN N) 100 UNIT/ML injection Inject 15 Units into the skin See admin instructions. Inject 15 units subcutaneously in the morning if CBG >300;     insulin regular (NOVOLIN R) 100 units/mL injection Inject into the skin See admin instructions. Manually add bolus to continuous dose via OmniPod 3 times daily per sliding scale (CBG 80-150 7 units, 151-200 9 units, 201-250 12 units, 251-300 14 units, 301- 400 17 units)     isosorbide mononitrate (IMDUR) 30 MG 24 hr tablet Take 0.5 tablets (15 mg total) by mouth daily. 30 tablet 6   levothyroxine (SYNTHROID, LEVOTHROID) 50 MCG tablet Take 1 tablet (50 mcg total) by mouth daily before breakfast. 90 tablet 1   magnesium oxide (MAG-OX) 400 (240 Mg) MG tablet Take 1 tablet (400 mg total) by mouth daily. 90 tablet 3   Multiple Vitamins-Minerals (AIRBORNE GUMMIES PO) Take 2 tablets by mouth every evening.     Multiple Vitamins-Minerals (CENTRUM SILVER 50+WOMEN) TABS Take 1 tablet by mouth every evening.     nitroGLYCERIN (NITROSTAT) 0.4 MG SL tablet PLACE 1 TABLET (0.4 MG TOTAL) UNDER THE TONGUE EVERY 5 (FIVE) MINUTES AS NEEDED FOR CHEST PAIN. 25 tablet 1   ONETOUCH VERIO  test strip 3 (three) times daily.     polyethylene glycol (MIRALAX / GLYCOLAX) 17 g packet 1 packet mixed with 8 ounces of fluid     potassium chloride SA (KLOR-CON M20) 20 MEQ tablet Take 1 tablet (20 mEq total) by mouth 2 (two) times a week. Monday and Friday 30 tablet 1   pregabalin (LYRICA) 150 MG capsule Take 150 mg by mouth 2 (two) times daily.     sitaGLIPtin (JANUVIA) 25 MG tablet Take 1 tablet (25 mg total) by mouth daily. 90 tablet 3   torsemide (DEMADEX) 20 MG tablet Take 80 mg by mouth 2 (two) times daily.     ursodiol (ACTIGALL) 500 MG tablet Take 500 mg by mouth 3 (three) times daily.     vitamin B-12 (CYANOCOBALAMIN) 1000 MCG tablet Take 1,000 mcg by mouth daily.     vortioxetine HBr (TRINTELLIX) 10 MG TABS tablet Take 1 tablet (10 mg total) by mouth daily. 30 tablet 5   zinc gluconate 50 MG tablet Take 50 mg by mouth daily.     metolazone (ZAROXOLYN) 2.5 MG tablet Take 1 tablet (2.5 mg total) by mouth daily. 90 tablet 3   No current facility-administered medications on file prior to visit.   Allergies  Allergen Reactions   Drug Ingredient [Cephalexin] Diarrhea    Pt reports heavy diarrhea with use of keflex   Codeine Nausea And Vomiting and Other (See Comments)   Social History   Socioeconomic History   Marital status: Married    Spouse name: Not on file   Number of children: 3   Years of education: 12th   Highest education level: Not on file  Occupational History    Employer: UNEMPLOYED  Tobacco Use   Smoking status: Former    Packs/day: 3.00    Years: 32.00    Pack years: 96.00    Types: Cigarettes    Quit date: 10/24/1997    Years since  quitting: 23.7   Smokeless tobacco: Never  Vaping Use   Vaping Use: Never used  Substance and Sexual Activity   Alcohol use: Not Currently    Comment: "might have 2-3 daiquiris in the summer"   Drug use: No   Sexual activity: Not Currently  Other Topics Concern   Not on file  Social History Narrative   Pt lives at home  with her spouse.   Caffeine Use- 3 sodas daily   Social Determinants of Health   Financial Resource Strain: Low Risk    Difficulty of Paying Living Expenses: Not very hard  Food Insecurity: No Food Insecurity   Worried About Charity fundraiser in the Last Year: Never true   Ran Out of Food in the Last Year: Never true  Transportation Needs: No Transportation Needs   Lack of Transportation (Medical): No   Lack of Transportation (Non-Medical): No  Physical Activity: Not on file  Stress: Not on file  Social Connections: Not on file  Intimate Partner Violence: Not on file     Review of Systems  Gastrointestinal:  Positive for diarrhea.  Neurological:  Positive for dizziness.      Objective:   Physical Exam Constitutional:      General: She is not in acute distress.    Appearance: Normal appearance. She is obese. She is not ill-appearing or toxic-appearing.  Cardiovascular:     Rate and Rhythm: Normal rate and regular rhythm.     Heart sounds: Normal heart sounds. No murmur heard.   No friction rub. No gallop.  Pulmonary:     Effort: Pulmonary effort is normal. No respiratory distress.     Breath sounds: Normal breath sounds. No stridor. No wheezing, rhonchi or rales.  Abdominal:     General: Bowel sounds are normal.     Palpations: Abdomen is soft.  Musculoskeletal:     Right lower leg: No edema.     Left lower leg: No edema.       Legs:  Skin:    General: Skin is warm.     Findings: No erythema.  Neurological:     Mental Status: She is alert.        Assessment & Plan:  Stage 4 chronic kidney disease (HCC)  Venous stasis ulcer of other part of right lower leg limited to breakdown of skin without varicose veins (HCC) Resume previous dose of diuretic.  Increase diuretic to 80 mg twice a day which is the patient's maintenance dose.  I replaced the Unna boot on both legs.  This is to allow complete and total resolution of healing of the skin on the left leg and to  hopefully facilitate closure of the 2 wounds still remaining on the anterior right leg.  I am extremely happy with how the wounds look today.  She is to leave the Unna boots in place until Saturday and then remove them at home.  I will see the patient back here in clinic on Wednesday next week for recheck.  I recommended she resume her compression stockings once she removes the boot.

## 2021-07-31 ENCOUNTER — Ambulatory Visit (INDEPENDENT_AMBULATORY_CARE_PROVIDER_SITE_OTHER): Payer: HMO | Admitting: Family Medicine

## 2021-07-31 ENCOUNTER — Other Ambulatory Visit: Payer: Self-pay

## 2021-07-31 ENCOUNTER — Encounter: Payer: Self-pay | Admitting: Family Medicine

## 2021-07-31 VITALS — BP 112/68 | HR 81 | Temp 97.5°F | Resp 18 | Ht 66.0 in | Wt 249.0 lb

## 2021-07-31 DIAGNOSIS — N12 Tubulo-interstitial nephritis, not specified as acute or chronic: Secondary | ICD-10-CM

## 2021-07-31 DIAGNOSIS — N184 Chronic kidney disease, stage 4 (severe): Secondary | ICD-10-CM | POA: Diagnosis not present

## 2021-07-31 DIAGNOSIS — R300 Dysuria: Secondary | ICD-10-CM | POA: Diagnosis not present

## 2021-07-31 DIAGNOSIS — R197 Diarrhea, unspecified: Secondary | ICD-10-CM | POA: Diagnosis not present

## 2021-07-31 LAB — URINALYSIS, ROUTINE W REFLEX MICROSCOPIC
Bilirubin Urine: NEGATIVE
Hyaline Cast: NONE SEEN /LPF
Ketones, ur: NEGATIVE
Nitrite: NEGATIVE
Specific Gravity, Urine: 1.02 (ref 1.001–1.035)
pH: 5 (ref 5.0–8.0)

## 2021-07-31 LAB — MICROSCOPIC MESSAGE

## 2021-07-31 MED ORDER — CIPROFLOXACIN HCL 500 MG PO TABS: 500.0000 mg | ORAL_TABLET | Freq: Every day | ORAL | 0 refills | Status: AC

## 2021-08-01 DIAGNOSIS — N390 Urinary tract infection, site not specified: Secondary | ICD-10-CM | POA: Diagnosis not present

## 2021-08-01 DIAGNOSIS — I129 Hypertensive chronic kidney disease with stage 1 through stage 4 chronic kidney disease, or unspecified chronic kidney disease: Secondary | ICD-10-CM | POA: Diagnosis not present

## 2021-08-01 DIAGNOSIS — E1122 Type 2 diabetes mellitus with diabetic chronic kidney disease: Secondary | ICD-10-CM | POA: Diagnosis not present

## 2021-08-01 DIAGNOSIS — N184 Chronic kidney disease, stage 4 (severe): Secondary | ICD-10-CM | POA: Diagnosis not present

## 2021-08-01 LAB — CBC WITH DIFFERENTIAL/PLATELET
Absolute Monocytes: 915 cells/uL (ref 200–950)
Basophils Absolute: 44 cells/uL (ref 0–200)
Basophils Relative: 0.5 %
Eosinophils Absolute: 519 cells/uL — ABNORMAL HIGH (ref 15–500)
Eosinophils Relative: 5.9 %
HCT: 37.5 % (ref 35.0–45.0)
Hemoglobin: 11.5 g/dL — ABNORMAL LOW (ref 11.7–15.5)
Lymphs Abs: 845 cells/uL — ABNORMAL LOW (ref 850–3900)
MCH: 24.8 pg — ABNORMAL LOW (ref 27.0–33.0)
MCHC: 30.7 g/dL — ABNORMAL LOW (ref 32.0–36.0)
MCV: 81 fL (ref 80.0–100.0)
MPV: 13.2 fL — ABNORMAL HIGH (ref 7.5–12.5)
Monocytes Relative: 10.4 %
Neutro Abs: 6477 cells/uL (ref 1500–7800)
Neutrophils Relative %: 73.6 %
Platelets: 215 10*3/uL (ref 140–400)
RBC: 4.63 10*6/uL (ref 3.80–5.10)
RDW: 15.7 % — ABNORMAL HIGH (ref 11.0–15.0)
Total Lymphocyte: 9.6 %
WBC: 8.8 10*3/uL (ref 3.8–10.8)

## 2021-08-01 LAB — BASIC METABOLIC PANEL WITH GFR
BUN/Creatinine Ratio: 20 (calc) (ref 6–22)
BUN: 78 mg/dL — ABNORMAL HIGH (ref 7–25)
CO2: 24 mmol/L (ref 20–32)
Calcium: 8.7 mg/dL (ref 8.6–10.4)
Chloride: 89 mmol/L — ABNORMAL LOW (ref 98–110)
Creat: 3.85 mg/dL — ABNORMAL HIGH (ref 0.60–1.00)
Glucose, Bld: 429 mg/dL — ABNORMAL HIGH (ref 65–99)
Potassium: 3.6 mmol/L (ref 3.5–5.3)
Sodium: 130 mmol/L — ABNORMAL LOW (ref 135–146)
eGFR: 12 mL/min/{1.73_m2} — ABNORMAL LOW (ref >=60)

## 2021-08-01 NOTE — Progress Notes (Signed)
Subjective:    Patient ID: Susan [name], female    DOB: 08/17/1950, 71 y.o.   MRN: [id number]  Patient reports 1 week of lower abdominal pain.  She reports increased urinary frequency and urgency.  She also reports dysuria.  She has left-sided CVA tenderness.  She is also reporting 3-5 episodes of watery diarrhea per day.  She denies any fever or chills.  Urinalysis shows cloudy urine with +2 glucose +1 blood +2 protein negative nitrates +2 leukocyte esterase.  The wounds on her legs from her previous visits have essentially healed.  She is back to wearing her compression hose. Past Medical History:  Diagnosis Date   Acute MI ([place]) 1999; 2007   Anemia    hx   Anginal pain ([place])    Anxiety    ARF (acute renal failure) ([place]) 06/2017   [facility]   Arthritis    "generalized" (03/15/2014)   CAD (coronary artery disease)    MI in 2000 - MI  2007 - treated bare metal stent (no nuclear since then as 9/11)   Carotid artery disease (HCC)    CHF (congestive heart failure) ([place])    Chronic diastolic heart failure ([place])    a) ECHO (08/2013) EF 55-60% and RV function nl b) RHC (08/2013) RA 4, RV 30/5/7, PA 25/10 (16), PCWP 7, Fick CO/CI 6.3/2.7, PVR 1.5 WU, PA 61 and 66%   Daily headache    "~ every other day; since I fell in June" (03/15/2014)   Depression    Dyslipidemia    Dyspnea    Exertional shortness of breath    HTN (hypertension)    Hypothyroidism    Neuropathy    Obesity    Osteoarthritis    Peripheral neuropathy    PONV (postoperative nausea and vomiting)    RBBB (right bundle branch block)    Old   Stroke ([place])    mini strokes   Syncope    likely due to low blood sugar   Tachycardia    Sinus tachycardia   Type II diabetes mellitus ([place])    Type II   Urinary incontinence    Venous insufficiency    Past Surgical History:  Procedure Laterality Date   ABDOMINAL HYSTERECTOMY  1980's   AMPUTATION Right 02/24/2018   Procedure: RIGHT FOOT GREAT TOE AND 2ND TOE  AMPUTATION;  Surgeon: Newt Minion, MD;  Location: [place];  Service: Orthopedics;  Laterality: Right;   AMPUTATION Right 04/30/2018   Procedure: RIGHT TRANSMETATARSAL AMPUTATION;  Surgeon: Newt Minion, MD;  Location: [place];  Service: Orthopedics;  Laterality: Right;   BIOPSY  05/27/2020   Procedure: BIOPSY;  Surgeon: [name], DO;  [facility];  Service: Endoscopy;;   CATARACT EXTRACTION, BILATERAL Bilateral ?2013   COLONOSCOPY W/ POLYPECTOMY     COLONOSCOPY WITH PROPOFOL N/A 03/13/2019   Procedure: COLONOSCOPY WITH PROPOFOL;  Surgeon: Jerene Bears, MD;  Location: [place];  Service: Gastroenterology;  Laterality: N/A;   [place]; 2007   "1 + 1"   ESOPHAGOGASTRODUODENOSCOPY (EGD) WITH PROPOFOL N/A 03/13/2019   Procedure: ESOPHAGOGASTRODUODENOSCOPY (EGD) WITH PROPOFOL;  Surgeon: Jerene Bears, MD;  Location: [place];  Service: Gastroenterology;  Laterality: N/A;   ESOPHAGOGASTRODUODENOSCOPY (EGD) WITH PROPOFOL N/A 05/27/2020   Procedure: ESOPHAGOGASTRODUODENOSCOPY (EGD) WITH PROPOFOL;  Surgeon: [name], DO;  [facility];  Service: Endoscopy;  Laterality: N/A;   EYE SURGERY Bilateral  lazer   HEMOSTASIS CLIP PLACEMENT  03/13/2019   Procedure: HEMOSTASIS CLIP PLACEMENT;  Surgeon: Jerene Bears, MD;  Location[facility];  Service: Gastroenterology;;   KNEE ARTHROSCOPY Left 10/25/2006   POLYPECTOMY  03/13/2019   Procedure: POLYPECTOMY;  Surgeon: Jerene Bears, MD;  Location: [place];  Service: Gastroenterology;;   RIGHT HEART CATH N/A 07/24/2017   Procedure: RIGHT HEART CATH;  Surgeon: Jolaine Artist, MD;  Location: [place] [facility];  Service: Cardiovascular;  Laterality: N/A;   RIGHT HEART CATHETERIZATION N/A 09/22/2013   Procedure: RIGHT HEART CATH;  Surgeon: Jolaine Artist, MD;  Location: [facility] CATH LAB;  Service: Cardiovascular;  Laterality: N/A;   SHOULDER ARTHROSCOPY WITH OPEN ROTATOR  CUFF REPAIR Right 03/14/2014   Procedure: RIGHT SHOULDER ARTHROSCOPY WITH BICEPS RELEASE, OPEN SUBSCAPULA REPAIR, OPEN SUPRASPINATUS REPAIR.;  Surgeon: Meredith Pel, MD;  Location: [place];  Service: Orthopedics;  Laterality: Right;   TOE AMPUTATION Right 02/24/2018   GREAT TOE AND 2ND TOE AMPUTATION   TUBAL LIGATION  1970's   Current Outpatient Medications on File Prior to Visit  Medication Sig Dispense Refill   acetaminophen (TYLENOL) 500 MG tablet Take 1,000 mg by mouth every 6 (six) hours as needed for moderate pain or headache.     albuterol (VENTOLIN HFA) 108 (90 Base) MCG/ACT inhaler TAKE 2 PUFFS BY MOUTH EVERY 6 HOURS AS NEEDED FOR WHEEZE OR SHORTNESS OF BREATH 8.5 g 1   allopurinol (ZYLOPRIM) 100 MG tablet Take 0.5 tablets (50 mg total) by mouth daily. 30 tablet 0   Ascorbic Acid (VITAMIN C) 1000 MG tablet Take 1,000 mg by mouth daily.     aspirin EC 81 MG tablet Take 1 tablet (81 mg total) by mouth daily with breakfast. 30 tablet 11   atorvastatin (LIPITOR) 80 MG tablet Take 1 tablet (80 mg total) by mouth daily. 30 tablet 5   carvedilol (COREG) 12.5 MG tablet TAKE 1 TABLET (12.5 MG TOTAL) BY MOUTH 2 (TWO) TIMES DAILY WITH A MEAL. 180 tablet 1   cholecalciferol (VITAMIN D) 1000 units tablet Take 1,000 Units by mouth daily with supper.      clopidogrel (PLAVIX) 75 MG tablet TAKE 1 TABLET BY MOUTH EVERY DAY WITH BREAKFAST 90 tablet 3   Cranberry 250 MG TABS Take 500 mg by mouth daily.     diphenoxylate-atropine (LOMOTIL) 2.5-0.025 MG tablet Take 1 tablet by mouth 4 (four) times daily as needed for diarrhea or loose stools. 30 tablet 0   ferrous sulfate 325 (65 FE) MG tablet Take 1 tablet (325 mg total) by mouth daily with breakfast. Reported on 08/21/2015 60 tablet 2   guaiFENesin-dextromethorphan (ROBITUSSIN DM) 100-10 MG/5ML syrup Take 10 mLs by mouth every 4 (four) hours as needed for cough. 118 mL 0   hydrocortisone cream 0.5 % Apply 1 application topically 2 (two) times daily. 30  g 0   Insulin Disposable Pump (OMNIPOD DASH 5 PACK PODS) MISC Inject into the skin See admin instructions. Use continuously with Novolin R - change every 72 hours.     insulin NPH Human (HUMULIN N,NOVOLIN N) 100 UNIT/ML injection Inject 15 Units into the skin See admin instructions. Inject 15 units subcutaneously in the morning if CBG >300;     insulin regular (NOVOLIN R) 100 units/mL injection Inject into the skin See admin instructions. Manually add bolus to continuous dose via OmniPod 3 times daily per sliding scale (CBG 80-150 7 units, 151-200 9 units, 201-250 12 units, 251-300 14 units,  301- 400 17 units)     isosorbide mononitrate (IMDUR) 30 MG 24 hr tablet Take 0.5 tablets (15 mg total) by mouth daily. 30 tablet 6   levothyroxine (SYNTHROID, LEVOTHROID) 50 MCG tablet Take 1 tablet (50 mcg total) by mouth daily before breakfast. 90 tablet 1   magnesium oxide (MAG-OX) 400 (240 Mg) MG tablet Take 1 tablet (400 mg total) by mouth daily. 90 tablet 3   Multiple Vitamins-Minerals (AIRBORNE GUMMIES PO) Take 2 tablets by mouth every evening.     Multiple Vitamins-Minerals (CENTRUM SILVER 50+WOMEN) TABS Take 1 tablet by mouth every evening.     nitroGLYCERIN (NITROSTAT) 0.4 MG SL tablet PLACE 1 TABLET (0.4 MG TOTAL) UNDER THE TONGUE EVERY 5 (FIVE) MINUTES AS NEEDED FOR CHEST PAIN. 25 tablet 1   ONETOUCH VERIO test strip 3 (three) times daily.     polyethylene glycol (MIRALAX / GLYCOLAX) 17 g packet 1 packet mixed with 8 ounces of fluid     potassium chloride SA (KLOR-CON M20) 20 MEQ tablet Take 1 tablet (20 mEq total) by mouth 2 (two) times a week. Monday and Friday 30 tablet 1   pregabalin (LYRICA) 150 MG capsule Take 150 mg by mouth 2 (two) times daily.     sitaGLIPtin (JANUVIA) 25 MG tablet Take 1 tablet (25 mg total) by mouth daily. 90 tablet 3   torsemide (DEMADEX) 20 MG tablet Take 80 mg by mouth 2 (two) times daily.     ursodiol (ACTIGALL) 500 MG tablet Take 500 mg by mouth 3 (three) times daily.      vitamin B-12 (CYANOCOBALAMIN) 1000 MCG tablet Take 1,000 mcg by mouth daily.     vortioxetine HBr (TRINTELLIX) 10 MG TABS tablet Take 1 tablet (10 mg total) by mouth daily. 30 tablet 5   zinc gluconate 50 MG tablet Take 50 mg by mouth daily.     metolazone (ZAROXOLYN) 2.5 MG tablet Take 1 tablet (2.5 mg total) by mouth daily. 90 tablet 3   No current facility-administered medications on file prior to visit.   Allergies  Allergen Reactions   Drug Ingredient [Cephalexin] Diarrhea    Pt reports heavy diarrhea with use of keflex   Codeine Nausea And Vomiting and Other (See Comments)   Social History   Socioeconomic History   Marital status: Married    Spouse name: Not on file   Number of children: 3   Years of education: 12th   Highest education level: Not on file  Occupational History    Employer: UNEMPLOYED  Tobacco Use   Smoking status: Former    Packs/day: 3.00    Years: 32.00    Pack years: 96.00    Types: Cigarettes    Quit date: 10/24/1997    Years since quitting: 23.7   Smokeless tobacco: Never  Vaping Use   Vaping Use: Never used  Substance and Sexual Activity   Alcohol use: Not Currently    Comment: "might have 2-3 daiquiris in the summer"   Drug use: No   Sexual activity: Not Currently  Other Topics Concern   Not on file  Social History Narrative   Pt lives at home with her spouse.   Caffeine Use- 3 sodas daily   Social Determinants of Health   Financial Resource Strain: Low Risk    Difficulty of Paying Living Expenses: Not very hard  Food Insecurity: No Food Insecurity   Worried About Running Out of Food in the Last Year: Never true   Ran Out of  Food in the Last Year: Never true  Transportation Needs: No Transportation Needs   Lack of Transportation (Medical): No   Lack of Transportation (Non-Medical): No  Physical Activity: Not on file  Stress: Not on file  Social Connections: Not on file  Intimate Partner Violence: Not on file     Review of  Systems  Gastrointestinal:  Positive for diarrhea.  Neurological:  Positive for dizziness.      Objective:   Physical Exam Constitutional:      General: She is not in acute distress.    Appearance: Normal appearance. She is obese. She is not ill-appearing or toxic-appearing.  Cardiovascular:     Rate and Rhythm: Normal rate and regular rhythm.     Heart sounds: Normal heart sounds. No murmur heard.   No friction rub. No gallop.  Pulmonary:     Effort: Pulmonary effort is normal. No respiratory distress.     Breath sounds: Normal breath sounds. No stridor. No wheezing, rhonchi or rales.  Abdominal:     General: Bowel sounds are normal.     Palpations: Abdomen is soft.     Tenderness: There is no abdominal tenderness. There is left CVA tenderness.  Musculoskeletal:     Right lower leg: No edema.     Left lower leg: No edema.  Skin:    General: Skin is warm.     Findings: No erythema.  Neurological:     Mental Status: She is alert.        Assessment & Plan:  Dysuria - Plan: Urinalysis, Routine w reflex microscopic, Urinalysis, Routine w reflex microscopic, CBC with Differential/Platelet, BASIC METABOLIC PANEL WITH GFR, Urine Culture, Urine Culture  Pyelonephritis  Diarrhea, unspecified type  Stage 4 chronic kidney disease ([place]) I believe the patient has a pyelonephritis.  I will start her on Cipro 500 mg once daily due to her stage IV chronic kidney disease for 7 days.  I will send a urine culture.  Regarding her diarrhea, there is no evidence of an acute abdomen.  Question if this could be due to her previous antibiotic use.  She is already taking a probiotic.  I recommended using Imodium.  I will check a CMP and a CBC as well.

## 2021-08-02 ENCOUNTER — Telehealth: Payer: Self-pay | Admitting: Pharmacist

## 2021-08-02 LAB — URINE CULTURE
MICRO NUMBER:: 13104345
SPECIMEN QUALITY:: ADEQUATE

## 2021-08-02 NOTE — Progress Notes (Unsigned)
Chronic Care Management Pharmacy Assistant   Name: Susan [name]  MRN: [id number] DOB: 1950/08/19   Reason for Encounter: Disease State - General Adherence Call     Recent office visits:  07/31/21 [name], MD - Family Medicine - Dysuria - Labs were ordered. Ciprofloxacin (CIPRO) 500 MG tablet was prescribed. Follow up as needed.   07/23/21 [name], MD - Stage 4 Chronic Kidney Disease -  Increase diuretic to 80 mg twice a day which is the patient's maintenance dose.   07/19/21 [name], MD - [name], MD - Diphenoxylate-atropine (LOMOTIL) 2.5-0.025 MG tablet prescribed. Lomotil 2 tablets every 6 hours as needed for diarrhea and she is directed to decrease her diuretic to 40 mg twice a day.  07/15/21 [name], MD - Family Medicine - Increase diuretic to 100 mg twice daily.  Placed the patient in [place] boots bilaterally. Follow up as scheduled.   07/11/21 [name], MD - Family Medicine- Diabetes - Labs were ordered. Doxycycline (VIBRA-TABS) 100 MG tablet prescribed. Follow up for recheck as scheduled.   Recent consult visits:  None noted.   Hospital visits: 03/05/21 - 03/10/21 Medication Reconciliation was completed by comparing discharge summary, patients EMR and Pharmacy list, and upon discussion with patient.   Admitted to the hospital on 03/05/21 due to NSTEMI. Discharge date was 03/10/21. Discharged from [place]?Medications Started at [facility] Discharge:?? atorvastatin (LIPITOR) fosfomycin (MONUROL) hydrALAZINE (APRESOLINE) hydrocortisone cream isosorbide mononitrate (IMDUR) linagliptin (TRADJENTA)     Medication Changes at Hospital Discharge: allopurinol (Zyloprim) ferrous sulfate magnesium oxide (MAG-OX)   Medications Discontinued at Hospital Discharge: cephALEXin 250 MG capsule (KEFLEX) pregabalin 150 MG capsule (Lyrica) traZODone 100 MG tablet (DESYREL)   Medications that remain the same after Hospital  Discharge:??  All other medications will remain the same.  Medications: Outpatient Encounter Medications as of 08/02/2021  Medication Sig Note   acetaminophen (TYLENOL) 500 MG tablet Take 1,000 mg by mouth every 6 (six) hours as needed for moderate pain or headache.    albuterol (VENTOLIN HFA) 108 (90 Base) MCG/ACT inhaler TAKE 2 PUFFS BY MOUTH EVERY 6 HOURS AS NEEDED FOR WHEEZE OR SHORTNESS OF BREATH    allopurinol (ZYLOPRIM) 100 MG tablet Take 0.5 tablets (50 mg total) by mouth daily.    Ascorbic Acid (VITAMIN C) 1000 MG tablet Take 1,000 mg by mouth daily.    aspirin EC 81 MG tablet Take 1 tablet (81 mg total) by mouth daily with breakfast.    atorvastatin (LIPITOR) 80 MG tablet Take 1 tablet (80 mg total) by mouth daily.    carvedilol (COREG) 12.5 MG tablet TAKE 1 TABLET (12.5 MG TOTAL) BY MOUTH 2 (TWO) TIMES DAILY WITH A MEAL.    cholecalciferol (VITAMIN D) 1000 units tablet Take 1,000 Units by mouth daily with supper.     ciprofloxacin (CIPRO) 500 MG tablet Take 1 tablet (500 mg total) by mouth daily with breakfast for 7 days.    clopidogrel (PLAVIX) 75 MG tablet TAKE 1 TABLET BY MOUTH EVERY DAY WITH BREAKFAST    Cranberry 250 MG TABS Take 500 mg by mouth daily.    diphenoxylate-atropine (LOMOTIL) 2.5-0.025 MG tablet Take 1 tablet by mouth 4 (four) times daily as needed for diarrhea or loose stools.    ferrous sulfate 325 (65 FE) MG tablet Take 1 tablet (325 mg total) by mouth daily with breakfast. Reported on 08/21/2015 05/23/2021: Taking twice daily   guaiFENesin-dextromethorphan (ROBITUSSIN DM) 100-10 MG/5ML syrup Take  10 mLs by mouth every 4 (four) hours as needed for cough.    hydrocortisone cream 0.5 % Apply 1 application topically 2 (two) times daily.    Insulin Disposable Pump (OMNIPOD DASH 5 PACK PODS) MISC Inject into the skin See admin instructions. Use continuously with Novolin R - change every 72 hours.    insulin NPH Human (HUMULIN N,NOVOLIN N) 100 UNIT/ML injection Inject 15  Units into the skin See admin instructions. Inject 15 units subcutaneously in the morning if CBG >300;    insulin regular (NOVOLIN R) 100 units/mL injection Inject into the skin See admin instructions. Manually add bolus to continuous dose via OmniPod 3 times daily per sliding scale (CBG 80-150 7 units, 151-200 9 units, 201-250 12 units, 251-300 14 units, 301- 400 17 units)    isosorbide mononitrate (IMDUR) 30 MG 24 hr tablet Take 0.5 tablets (15 mg total) by mouth daily.    levothyroxine (SYNTHROID, LEVOTHROID) 50 MCG tablet Take 1 tablet (50 mcg total) by mouth daily before breakfast.    magnesium oxide (MAG-OX) 400 (240 Mg) MG tablet Take 1 tablet (400 mg total) by mouth daily.    metolazone (ZAROXOLYN) 2.5 MG tablet Take 1 tablet (2.5 mg total) by mouth daily. 05/23/2021: Taking once weekly   Multiple Vitamins-Minerals (AIRBORNE GUMMIES PO) Take 2 tablets by mouth every evening.    Multiple Vitamins-Minerals (CENTRUM SILVER 50+WOMEN) TABS Take 1 tablet by mouth every evening.    nitroGLYCERIN (NITROSTAT) 0.4 MG SL tablet PLACE 1 TABLET (0.4 MG TOTAL) UNDER THE TONGUE EVERY 5 (FIVE) MINUTES AS NEEDED FOR CHEST PAIN.    ONETOUCH VERIO test strip 3 (three) times daily.    polyethylene glycol (MIRALAX / GLYCOLAX) 17 g packet 1 packet mixed with 8 ounces of fluid    potassium chloride SA (KLOR-CON M20) 20 MEQ tablet Take 1 tablet (20 mEq total) by mouth 2 (two) times a week. Monday and Friday    pregabalin (LYRICA) 150 MG capsule Take 150 mg by mouth 2 (two) times daily.    sitaGLIPtin (JANUVIA) 25 MG tablet Take 1 tablet (25 mg total) by mouth daily.    torsemide (DEMADEX) 20 MG tablet Take 80 mg by mouth 2 (two) times daily.    ursodiol (ACTIGALL) 500 MG tablet Take 500 mg by mouth 3 (three) times daily.    vitamin B-12 (CYANOCOBALAMIN) 1000 MCG tablet Take 1,000 mcg by mouth daily.    vortioxetine HBr (TRINTELLIX) 10 MG TABS tablet Take 1 tablet (10 mg total) by mouth daily.    zinc gluconate 50  MG tablet Take 50 mg by mouth daily.    No facility-administered encounter medications on file as of 08/02/2021.    Have you had any problems recently with your health?   Have you had any problems with your pharmacy?   What issues or side effects are you having with your medications?   What would you like me to pass along to Leata Mouse, CPP for them to help you with?    What can we do to take care of you better?   Care Gaps   AWV: overdue Colonoscopy: done 03/13/19 DM Eye Exam: due 09/24/18 DM Foot Exam: unknown  Microalbumin: done 06/29/20 HbgAIC: done 08/26/20 (10.3) DEXA: unknown  Mammogram: done 11/22/15   Star Rating Drugs: Atorvastatin (LIPITOR) 80 MG tablet - last filled 06/22/21 90 days    Future Appointments  Date Time Provider [place]  08/05/2021  4:00 PM [facility]  08/22/2021  3:30 PM [facility] PHARMACIST [facility]  09/30/2021  2:45 PM Newt Minion, MD [facility] None     Jobe Gibbon, [facility] Clinical Pharmacist Assistant  [phone]

## 2021-08-05 ENCOUNTER — Other Ambulatory Visit: Payer: HMO

## 2021-08-05 ENCOUNTER — Other Ambulatory Visit: Payer: Self-pay

## 2021-08-05 ENCOUNTER — Telehealth: Payer: HMO

## 2021-08-05 DIAGNOSIS — N12 Tubulo-interstitial nephritis, not specified as acute or chronic: Secondary | ICD-10-CM | POA: Diagnosis not present

## 2021-08-05 DIAGNOSIS — N184 Chronic kidney disease, stage 4 (severe): Secondary | ICD-10-CM

## 2021-08-05 LAB — URINALYSIS, ROUTINE W REFLEX MICROSCOPIC
Bilirubin Urine: NEGATIVE
Hyaline Cast: NONE SEEN /LPF
Ketones, ur: NEGATIVE
Nitrite: NEGATIVE
Specific Gravity, Urine: 1.015 (ref 1.001–1.035)
pH: 5 (ref 5.0–8.0)

## 2021-08-05 LAB — MICROSCOPIC MESSAGE

## 2021-08-06 LAB — BASIC METABOLIC PANEL
BUN/Creatinine Ratio: 21 (calc) (ref 6–22)
BUN: 64 mg/dL — ABNORMAL HIGH (ref 7–25)
CO2: 26 mmol/L (ref 20–32)
Calcium: 9.3 mg/dL (ref 8.6–10.4)
Chloride: 92 mmol/L — ABNORMAL LOW (ref 98–110)
Creat: 3.08 mg/dL — ABNORMAL HIGH (ref 0.60–1.00)
Glucose, Bld: 343 mg/dL — ABNORMAL HIGH (ref 65–99)
Potassium: 3.6 mmol/L (ref 3.5–5.3)
Sodium: 134 mmol/L — ABNORMAL LOW (ref 135–146)

## 2021-08-13 NOTE — Progress Notes (Deleted)
?Chronic Care Management ?Pharmacy Note ? ?05/07/2021 ?Name:  Susan [name] MRN:  [id number] DOB:  05/25/1951 ? ?Summary: ?Initial visit with PharmD.  DM severely uncontrolled.  She reports she is just giving herself bolus through omnipod.  Unclear if there is a basal rate.  She also cannot afford Trintellix. ? ?Recommendations/Changes made from today's visit: ?Substantial counseling on DM diet - patient to work on decreasing fast food and gatorades, soda ? ?Plan: ?FU 3 months ? ? ?Subjective: ?Susan [name] is an 71 y.o. year old female who is a primary patient of [name], [name], MD.  The CCM team was consulted for assistance with disease management and care coordination needs.   ? ?Engaged with patient by telephone for initial visit in response to provider referral for pharmacy case management and/or care coordination services.  ? ?Consent to Services:  ?The patient was given the following information about Chronic Care Management services today, agreed to services, and gave verbal consent: 1. CCM service includes personalized support from designated clinical staff supervised by the primary care provider, including individualized plan of care and coordination with other care providers 2. 24/7 contact phone numbers for assistance for urgent and routine care needs. 3. Service will only be billed when office clinical staff spend 20 minutes or more in a month to coordinate care. 4. Only one practitioner may furnish and bill the service in a calendar month. 5.The patient may stop CCM services at any time (effective at the end of the month) by phone call to the office staff. 6. The patient will be responsible for cost sharing (co-pay) of up to 20% of the service fee (after annual deductible is met). Patient agreed to services and consent obtained. ? ?Patient Care Team: ?[name], MD as PCP - General (Family Medicine) ?Bensimhon, Shaune Pascal, MD as PCP - Cardiology (Cardiology) ?Jacelyn Pi, MD  (Endocrinology) ?Fluech, Nevin Bloodgood, PA-C (Neurology) ?Newt Minion, MD as Consulting Physician (Orthopedic Surgery) ?Jorge Ny, LCSW as Education officer, museum (Licensed Holiday representative) ?Kassie Mends, RN as [place] Management ?Edythe Clarity, [facility] as Pharmacist (Pharmacist) ? ?Recent office visits:  ?  ?04/04/21 [name], MD (PCP) - Family Medicine - Stage 4 CKD -Labs were ordered. vortioxetine HBr (TRINTELLIX) 10 MG TABS tablet take 1 tablet (10 mg total) by mouth daily prescribed. Follow up as scheduled. ?  ?03/15/21 [name], MD (PCP) - Family Medicine - Stage 4 CKD - Labs were ordered. Referral for Home health placed. Pregabalin (LYRICA) 50 MG capsule take 1 capsule (50 mg total) by mouth 2 (two) times daily ordered.  Discontinue Prozac and switch to Trintellix 10 mg a day and recheck in 3 weeks. ?  ?03/05/21 [name], MD (PCP) - Family Medicine - Dyspnea - Labs were ordered. EKG performed in office. shortness of breath is related to cardiac ischemia.  Patient refuses to go to the hospital [place].  After much discussion, I was able to convince the patient to go to the hospital but she adamantly refuses to allow me to call an ambulance.  I do not feel that the patient is in a life-threatening emergency at this moment.  Therefore I will compromise with the patient and allow her to drive herself to the local emergency room for further evaluation.  ?  ?12/17/20 [name], MD (PCP) - Family Medicine - Blood blister - Recommended continue dressing changes for the remainder of this week and then resume normal behavior.  She  can reduce her torsemide back to 5 tablets in the morning and 4 tablets in the evening as the swelling is back to her baseline. Follow up as scheduled.  ?  ?12/13/20 [name], MD (PCP) - Family Medicine - Blood blister  - aspirated the contents of the blood blister with a 18-gauge needle and a 20 cc syringe.  I was able  to completely empty the blood blister.  I then coated the wound with Silvadene, nonadherent gauze, and then wrapped the foot with Coban.  Recommended daily dressing changes.  Recheck next week.  Increase torsemide 100 mg twice daily until seen on Monday due to increased weight and edema ?  ?11/15/20 [name], MD (PCP) - Family Medicine - Chronic Diastolic CHF - Labs were ordered. cephALEXin (KEFLEX) 250 MG capsule take 1 capsule (250 mg total) by mouth daily prescribed. anesthetized the seborrheic keratosis on the left side of her neck and removed it using sterile technique with a razor blade. Continue the patient on torsemide 100 mg in the morning and 80 mg in the evening  We discussed the options and the patient would like to try low-dose Keflex 250 mg daily despite that it may have caused diarrhea in the past for prevention of other urinary tract infections to see if this helps.  If she develops diarrhea she will discontinue it immediately. ?  ?  ?Recent consult visits:  ?  ?04/09/21 Meridee Score, MD - Orthopedic surgery - Onychomycosis - No medication changes. Follow up in 3 months.  ?  ?01/07/21 Meridee Score, MD - Orthopedic surgery- Idiopathic chronic venous hypertension of both lower extremities - Recommended patient wear the extra-large compression stocking she is currently wearing the double extra-large.  The socks were applied for her.  For her legs recommended exercise elevation and compression.Follow up in 3 months.  ?  ?11/12/20  Meridee Score, MD - Orthopedic surgery- Idiopathic chronic venous hypertension of both lower extremities - No medication changes. Follow up in 3 months.  ?  ?  ?Hospital visits: 02/05/2021 ?  ?Medication Reconciliation was completed by comparing discharge summary, patient?s EMR and Pharmacy list, and upon discussion with patient. ?  ?Admitted to the hospital on 02/05/21 due to Diabetic ketoacidosis. Discharge date was 02/09/21. Discharged from [facility]   ?   ?New?Medications Started at [facility] Discharge:?? ?Azithromycin (ZITHROMAX) 500 MG tablet and loperamide (IMODIUM) 2 MG capsule prescribed. ?  ?Medication Changes at Hospital Discharge: ?Changed Torsemide to 63m Take 2 tablets (40 mg total) by mouth 2 (two) times daily. ?  ?Medications Discontinued at Hospital Discharge: ?Metolazone 2.5 and Potassium Chloride 20 meq ?  ?Medications that remain the same after Hospital Discharge:??  ?All other medications will remain the same.   ?  ?Hospital visits: 03/05/21 ?  ?Medication Reconciliation was completed by comparing discharge summary, patient?s EMR and Pharmacy list, and upon discussion with patient. ?  ?Admitted to the hospital on 03/05/21 due to NSTEMI. [facility]charge date was 03/10/21. [facility]   ?  ?[facility] ?atorvastatin (LIPITOR) 80 MG tablet, isosorbide mononitrate (IMDUR) 30 MG 24 hr tablet, linagliptin (TRADJENTA) 5 MG TABS tablet, hydrALAZINE (APRESOLINE) 10 MG tablet, fosfomycin (MONUROL) 3 g PACK were prescribed. ?  ?Medication Changes at Hospital Discharge: ?ferrous sulfate 325 (65 FE) MG tablet and magnesium oxide (MAG-OX) 400 MG tablet were changed. ?  ?Medications Discontinued at Hospital Discharge: ?Lyrica, Trazodone, and Keflex were discontinued.  ?  ?Medications that remain the same  after Hospital Discharge:??  ?All other medications will remain the same.   ?  ? ? ?Objective: ? ?Lab Results  ?Component Value Date  ? CREATININE 4.32 (H) 05/06/2021  ? BUN 103 (H) 05/06/2021  ? GFR 41.58 (L) 08/17/2013  ? GFRNONAA 10 (L) 05/06/2021  ? GFRAA 13 (L) 11/15/2020  ? NA 134 (L) 05/06/2021  ? K 3.4 (L) 05/06/2021  ? CALCIUM 8.4 (L) 05/06/2021  ? CO2 26 05/06/2021  ? GLUCOSE 419 (H) 05/06/2021  ? ? ?Lab Results  ?Component Value Date/Time  ? HGBA1C 12.2 (H) 03/05/2021 05:34 PM  ? HGBA1C 10.3 (H) 08/26/2020 08:01 AM  ? GFR 41.58 (L) 08/17/2013 02:46 PM  ? GFR 61.93 01/17/2011 02:36 PM  ? MICROALBUR  287.8 06/29/2020 01:58 PM  ? MICROALBUR 65.6 11/14/2016 09:25 AM  ?  ?Last diabetic Eye exam:  ?Lab Results  ?Component Value Date/Time  ? HMDIABEYEEXA Retinopathy (A) 09/23/2017 12:00 AM  ?  ?Last diabetic Foot exam: No results f

## 2021-08-15 DIAGNOSIS — I5032 Chronic diastolic (congestive) heart failure: Secondary | ICD-10-CM | POA: Diagnosis not present

## 2021-08-16 ENCOUNTER — Other Ambulatory Visit: Payer: Self-pay

## 2021-08-16 ENCOUNTER — Ambulatory Visit (INDEPENDENT_AMBULATORY_CARE_PROVIDER_SITE_OTHER): Payer: HMO

## 2021-08-16 VITALS — Ht 66.0 in | Wt 251.0 lb

## 2021-08-16 DIAGNOSIS — Z0000 Encounter for general adult medical examination without abnormal findings: Secondary | ICD-10-CM

## 2021-08-16 NOTE — Progress Notes (Signed)
? ?Subjective:  ? Susan [name] is a 71 y.o. female who presents for an Initial Medicare Annual Wellness Visit. ?Virtual Visit via Telephone Note ? ?I connected with  Susan [name] on 08/16/21 at  3:30 PM EDT by telephone and verified that I am speaking with the correct person using two identifiers. ? ?Location: ?Patient: HOME ?Provider: [facility] ?Persons participating in the virtual visit: patient/Nurse Health Advisor ?  ?I discussed the limitations, risks, security and privacy concerns of performing an evaluation and management service by telephone and the availability of in person appointments. The patient expressed understanding and agreed to proceed. ? ?Interactive audio and video telecommunications were attempted between this nurse and patient, however failed, due to patient having technical difficulties OR patient did not have access to video capability.  We continued and completed visit with audio only. ? ?Some vital signs may be absent or patient reported.  ? ?Susan Driver, LPN ? ?Review of Systems    ? ?Cardiac Risk Factors include: advanced age (>26mn, >>86women);diabetes mellitus;sedentary lifestyle;obesity (BMI >30kg/m2);Other (see comment);hypertension, Risk factor comments: CHF, CKD ? ?   ?Objective:  ?  ?Today's Vitals  ? 08/16/21 1523 08/16/21 1524  ?Weight: 251 lb (113.9 kg)   ?Height: '5\' 6"'$  (1.676 m)   ?PainSc:  5   ? ?Body mass index is 40.51 kg/m?. ? ? ?  08/16/2021  ?  3:43 PM 03/05/2021  ? 12:23 PM 02/04/2021  ?  1:55 PM 08/22/2020  ?  5:27 PM 05/25/2020  ?  4:31 PM 05/24/2020  ?  1:53 PM 05/18/2020  ?  4:01 PM  ?Advanced Directives  ?Does Patient Have a Medical Advance Directive? No No No No No No   ?Would patient like information on creating a medical advance directive? No - Patient declined No - Patient declined No - Patient declined No - Patient declined No - Guardian declined;No - Patient declined  No - Patient declined  ? ? ?Current Medications (verified) ?Outpatient Encounter Medications  as of 08/16/2021  ?Medication Sig  ? acetaminophen (TYLENOL) 500 MG tablet Take 1,000 mg by mouth every 6 (six) hours as needed for moderate pain or headache.  ? albuterol (VENTOLIN HFA) 108 (90 Base) MCG/ACT inhaler TAKE 2 PUFFS BY MOUTH EVERY 6 HOURS AS NEEDED FOR WHEEZE OR SHORTNESS OF BREATH  ? allopurinol (ZYLOPRIM) 100 MG tablet Take 0.5 tablets (50 mg total) by mouth daily.  ? Ascorbic Acid (VITAMIN C) 1000 MG tablet Take 1,000 mg by mouth daily.  ? aspirin EC 81 MG tablet Take 1 tablet (81 mg total) by mouth daily with breakfast.  ? atorvastatin (LIPITOR) 80 MG tablet Take 1 tablet (80 mg total) by mouth daily.  ? carvedilol (COREG) 12.5 MG tablet TAKE 1 TABLET (12.5 MG TOTAL) BY MOUTH 2 (TWO) TIMES DAILY WITH A MEAL.  ? cholecalciferol (VITAMIN D) 1000 units tablet Take 1,000 Units by mouth daily with supper.   ? clopidogrel (PLAVIX) 75 MG tablet TAKE 1 TABLET BY MOUTH EVERY DAY WITH BREAKFAST  ? Cranberry 250 MG TABS Take 500 mg by mouth daily.  ? diphenoxylate-atropine (LOMOTIL) 2.5-0.025 MG tablet Take 1 tablet by mouth 4 (four) times daily as needed for diarrhea or loose stools.  ? ferrous sulfate 325 (65 FE) MG tablet Take 1 tablet (325 mg total) by mouth daily with breakfast. Reported on 08/21/2015  ? guaiFENesin-dextromethorphan (ROBITUSSIN DM) 100-10 MG/5ML syrup Take 10 mLs by mouth every 4 (four) hours as needed for cough.  ? hydrocortisone  cream 0.5 % Apply 1 application topically 2 (two) times daily.  ? Insulin Disposable Pump (OMNIPOD DASH 5 PACK PODS) MISC Inject into the skin See admin instructions. Use continuously with Novolin R - change every 72 hours.  ? insulin NPH Human (HUMULIN N,NOVOLIN N) 100 UNIT/ML injection Inject 15 Units into the skin See admin instructions. Inject 15 units subcutaneously in the morning if CBG >300;  ? insulin regular (NOVOLIN R) 100 units/mL injection Inject into the skin See admin instructions. Manually add bolus to continuous dose via OmniPod 3 times daily per  sliding scale (CBG 80-150 7 units, 151-200 9 units, 201-250 12 units, 251-300 14 units, 301- 400 17 units)  ? isosorbide mononitrate (IMDUR) 30 MG 24 hr tablet Take 0.5 tablets (15 mg total) by mouth daily.  ? levothyroxine (SYNTHROID, LEVOTHROID) 50 MCG tablet Take 1 tablet (50 mcg total) by mouth daily before breakfast.  ? magnesium oxide (MAG-OX) 400 (240 Mg) MG tablet Take 1 tablet (400 mg total) by mouth daily.  ? Multiple Vitamins-Minerals (AIRBORNE GUMMIES PO) Take 2 tablets by mouth every evening.  ? Multiple Vitamins-Minerals (CENTRUM SILVER 50+WOMEN) TABS Take 1 tablet by mouth every evening.  ? nitroGLYCERIN (NITROSTAT) 0.4 MG SL tablet PLACE 1 TABLET (0.4 MG TOTAL) UNDER THE TONGUE EVERY 5 (FIVE) MINUTES AS NEEDED FOR CHEST PAIN.  ? ONETOUCH VERIO test strip 3 (three) times daily.  ? polyethylene glycol (MIRALAX / GLYCOLAX) 17 g packet 1 packet mixed with 8 ounces of fluid  ? potassium chloride SA (KLOR-CON M20) 20 MEQ tablet Take 1 tablet (20 mEq total) by mouth 2 (two) times a week. Monday and Friday  ? pregabalin (LYRICA) 150 MG capsule Take 150 mg by mouth 2 (two) times daily.  ? sitaGLIPtin (JANUVIA) 25 MG tablet Take 1 tablet (25 mg total) by mouth daily.  ? torsemide (DEMADEX) 20 MG tablet Take 80 mg by mouth 2 (two) times daily.  ? ursodiol (ACTIGALL) 500 MG tablet Take 500 mg by mouth 3 (three) times daily.  ? vitamin B-12 (CYANOCOBALAMIN) 1000 MCG tablet Take 1,000 mcg by mouth daily.  ? vortioxetine HBr (TRINTELLIX) 10 MG TABS tablet Take 1 tablet (10 mg total) by mouth daily.  ? zinc gluconate 50 MG tablet Take 50 mg by mouth daily.  ? metolazone (ZAROXOLYN) 2.5 MG tablet Take 1 tablet (2.5 mg total) by mouth daily.  ? ?No facility-administered encounter medications on file as of 08/16/2021.  ? ? ?Allergies (verified) ?Drug ingredient [cephalexin] and Codeine  ? ?History: ?Past Medical History:  ?Diagnosis Date  ? Acute MI [facility]) 1999; 2007  ? Anemia   ? hx  ? Anginal pain ([place])   ? Anxiety   ?  ARF (acute renal failure) ([place]) 06/2017  ? [facility]  ? Arthritis   ? "generalized" (03/15/2014)  ? CAD (coronary artery disease)   ? MI in 2000 - MI  2007 - treated bare metal stent (no nuclear since then as 9/11)  ? Carotid artery disease ([place])   ? CHF (congestive heart failure) ([place])   ? Chronic diastolic heart failure ([place])   ? a) ECHO (08/2013) EF 55-60% and RV function nl b) RHC (08/2013) RA 4, RV 30/5/7, PA 25/10 (16), PCWP 7, Fick CO/CI 6.3/2.7, PVR 1.5 WU, PA 61 and 66%  ? Daily headache   ? "~ every other day; since I fell in June" (03/15/2014)  ? Depression   ? Dyslipidemia   ? Dyspnea   ? Exertional shortness  of breath   ? HTN (hypertension)   ? Hypothyroidism   ? Neuropathy   ? Obesity   ? Osteoarthritis   ? Peripheral neuropathy   ? PONV (postoperative nausea and vomiting)   ? RBBB (right bundle branch block)   ? Old  ? Stroke [facility])   ? mini strokes  ? Syncope   ? likely due to low blood sugar  ? Tachycardia   ? Sinus tachycardia  ? Type II diabetes mellitus ([place])   ? Type II  ? Urinary incontinence   ? Venous insufficiency   ? ?Past Surgical History:  ?Procedure Laterality Date  ? ABDOMINAL HYSTERECTOMY  1980's  ? AMPUTATION Right 02/24/2018  ? Procedure: RIGHT FOOT GREAT TOE AND 2ND TOE AMPUTATION;  Surgeon: Newt Minion, MD;  Location: [place];  Service: Orthopedics;  Laterality: Right;  ? AMPUTATION Right 04/30/2018  ? Procedure: RIGHT TRANSMETATARSAL AMPUTATION;  Surgeon: Newt Minion, MD;  Location: [place];  Service: Orthopedics;  Laterality: Right;  ? BIOPSY  05/27/2020  ? Procedure: BIOPSY;  Surgeon: [name], DO;  [facility];  Service: Endoscopy;;  ? CATARACT EXTRACTION, BILATERAL Bilateral ?2013  ? COLONOSCOPY W/ POLYPECTOMY    ? COLONOSCOPY WITH PROPOFOL N/A 03/13/2019  ? Procedure: COLONOSCOPY WITH PROPOFOL;  Surgeon: Jerene Bears, MD;  Location: [place];  Service: Gastroenterology;  Laterality: N/A;  ? CORONARY ANGIOPLASTY WITH STENT PLACEMENT  1999; 2007   ? "1 + 1"  ? ESOPHAGOGASTRODUODENOSCOPY (EGD) WITH PROPOFOL N/A 03/13/2019  ? Procedure: ESOPHAGOGASTRODUODENOSCOPY (EGD) WITH PROPOFOL;  Surgeon: Jerene Bears, MD;  Location: [place] [facility];  Lewanda Rife

## 2021-08-16 NOTE — Patient Instructions (Signed)
[name] , ?Thank you for taking time to come for your Medicare Wellness Visit. I appreciate your ongoing commitment to your health goals. Please review the following plan we discussed and let me know if I can assist you in the future.  ? ?Screening recommendations/referrals: ?Colonoscopy: Done 03/13/2019 Repeat in 10 years ? ?Mammogram: Done 11/22/2015 Repeat annually ? ?Bone Density: Declined.  ? ?Recommended yearly ophthalmology/optometry visit for glaucoma screening and checkup ?Recommended yearly dental visit for hygiene and checkup ? ?Vaccinations: ?Influenza vaccine: Done Repeat annually ? ?Pneumococcal vaccine: Done 06/02/2017, 07/12/2015 and 12/26/2012. ?Tdap vaccine: Due Repeat in 10 years ? ?Shingles vaccine: Done 09/25/2017, 06/02/2017   ?Covid-19:Done 06/18/20, 08/27/19, 07/30/19 ? ?Advanced directives: Advance directive discussed with you today. Even though you declined this today, please call our office should you change your mind, and we can give you the proper paperwork for you to fill out. ? ? ?Conditions/risks identified: Aim for 30 minutes of exercise or brisk walking, 6-8 glasses of water, and 5 servings of fruits and vegetables each day. ? ? ?Next appointment: Follow up in one year for your annual wellness visit 2024 ? ? ? ?Preventive Care 34 Years and Older, Female ?Preventive care refers to lifestyle choices and visits with your health care provider that can promote health and wellness. ?What does preventive care include? ?A yearly physical exam. This is also called an annual well check. ?Dental exams once or twice a year. ?Routine eye exams. Ask your health care provider how often you should have your eyes checked. ?Personal lifestyle choices, including: ?Daily care of your teeth and gums. ?Regular physical activity. ?Eating a healthy diet. ?Avoiding tobacco and drug use. ?Limiting alcohol use. ?Practicing safe sex. ?Taking low-dose aspirin every day. ?Taking vitamin and mineral supplements as recommended  by your health care provider. ?What happens during an annual well check? ?The services and screenings done by your health care provider during your annual well check will depend on your age, overall health, lifestyle risk factors, and family history of disease. ?Counseling  ?Your health care provider may ask you questions about your: ?Alcohol use. ?Tobacco use. ?Drug use. ?Emotional well-being. ?Home and relationship well-being. ?Sexual activity. ?Eating habits. ?History of falls. ?Memory and ability to understand (cognition). ?Work and work Statistician. ?Reproductive health. ?Screening  ?You may have the following tests or measurements: ?Height, weight, and BMI. ?Blood pressure. ?Lipid and cholesterol levels. These may be checked every 5 years, or more frequently if you are over 15 years old. ?Skin check. ?Lung cancer screening. You may have this screening every year starting at age 65 if you have a 30-pack-year history of smoking and currently smoke or have quit within the past 15 years. ?Fecal occult blood test (FOBT) of the stool. You may have this test every year starting at age 73. ?Flexible sigmoidoscopy or colonoscopy. You may have a sigmoidoscopy every 5 years or a colonoscopy every 10 years starting at age 52. ?Hepatitis C blood test. ?Hepatitis B blood test. ?Sexually transmitted disease (STD) testing. ?Diabetes screening. This is done by checking your blood sugar (glucose) after you have not eaten for a while (fasting). You may have this done every 1-3 years. ?Bone density scan. This is done to screen for osteoporosis. You may have this done starting at age 45. ?Mammogram. This may be done every 1-2 years. Talk to your health care provider about how often you should have regular mammograms. ?Talk with your health care provider about your test results, treatment options, and if  necessary, the need for more tests. ?Vaccines  ?Your health care provider may recommend certain vaccines, such as: ?Influenza  vaccine. This is recommended every year. ?Tetanus, diphtheria, and acellular pertussis (Tdap, Td) vaccine. You may need a Td booster every 10 years. ?Zoster vaccine. You may need this after age 29. ?Pneumococcal 13-valent conjugate (PCV13) vaccine. One dose is recommended after age 57. ?Pneumococcal polysaccharide (PPSV23) vaccine. One dose is recommended after age 53. ?Talk to your health care provider about which screenings and vaccines you need and how often you need them. ?This information is not intended to replace advice given to you by your health care provider. Make sure you discuss any questions you have with your health care provider. ?Document Released: 06/08/2015 Document Revised: 01/30/2016 Document Reviewed: 03/13/2015 ?Elsevier Interactive Patient Education ? 2017 [employer]. ? ?Fall Prevention in the Home ?Falls can cause injuries. They can happen to people of all ages. There are many things you can do to make your home safe and to help prevent falls. ?What can I do on the outside of my home? ?Regularly fix the edges of walkways and driveways and fix any cracks. ?Remove anything that might make you trip as you walk through a door, such as a raised step or threshold. ?Trim any bushes or trees on the path to your home. ?Use bright outdoor lighting. ?Clear any walking paths of anything that might make someone trip, such as rocks or tools. ?Regularly check to see if handrails are loose or broken. Make sure that both sides of any steps have handrails. ?Any raised decks and porches should have guardrails on the edges. ?Have any leaves, snow, or ice cleared regularly. ?Use sand or salt on walking paths during winter. ?Clean up any spills in your garage right away. This includes oil or grease spills. ?What can I do in the bathroom? ?Use night lights. ?Install grab bars by the toilet and in the tub and shower. Do not use towel bars as grab bars. ?Use non-skid mats or decals in the tub or shower. ?If you  need to sit down in the shower, use a plastic, non-slip stool. ?Keep the floor dry. Clean up any water that spills on the floor as soon as it happens. ?Remove soap buildup in the tub or shower regularly. ?Attach bath mats securely with double-sided non-slip rug tape. ?Do not have throw rugs and other things on the floor that can make you trip. ?What can I do in the bedroom? ?Use night lights. ?Make sure that you have a light by your bed that is easy to reach. ?Do not use any sheets or blankets that are too big for your bed. They should not hang down onto the floor. ?Have a firm chair that has side arms. You can use this for support while you get dressed. ?Do not have throw rugs and other things on the floor that can make you trip. ?What can I do in the kitchen? ?Clean up any spills right away. ?Avoid walking on wet floors. ?Keep items that you use a lot in easy-to-reach places. ?If you need to reach something above you, use a strong step stool that has a grab bar. ?Keep electrical cords out of the way. ?Do not use floor polish or wax that makes floors slippery. If you must use wax, use non-skid floor wax. ?Do not have throw rugs and other things on the floor that can make you trip. ?What can I do with my stairs? ?Do not leave any items  on the stairs. ?Make sure that there are handrails on both sides of the stairs and use them. Fix handrails that are broken or loose. Make sure that handrails are as long as the stairways. ?Check any carpeting to make sure that it is firmly attached to the stairs. Fix any carpet that is loose or worn. ?Avoid having throw rugs at the top or bottom of the stairs. If you do have throw rugs, attach them to the floor with carpet tape. ?Make sure that you have a light switch at the top of the stairs and the bottom of the stairs. If you do not have them, ask someone to add them for you. ?What else can I do to help prevent falls? ?Wear shoes that: ?Do not have high heels. ?Have rubber  bottoms. ?Are comfortable and fit you well. ?Are closed at the toe. Do not wear sandals. ?If you use a stepladder: ?Make sure that it is fully opened. Do not climb a closed stepladder. ?Make sure that both sides o

## 2021-08-19 ENCOUNTER — Other Ambulatory Visit: Payer: Self-pay

## 2021-08-19 ENCOUNTER — Ambulatory Visit (INDEPENDENT_AMBULATORY_CARE_PROVIDER_SITE_OTHER): Payer: HMO | Admitting: Family Medicine

## 2021-08-19 VITALS — BP 120/68 | HR 88 | Temp 97.5°F | Ht 66.0 in

## 2021-08-19 DIAGNOSIS — L03116 Cellulitis of left lower limb: Secondary | ICD-10-CM

## 2021-08-19 DIAGNOSIS — R197 Diarrhea, unspecified: Secondary | ICD-10-CM | POA: Diagnosis not present

## 2021-08-19 DIAGNOSIS — M7989 Other specified soft tissue disorders: Secondary | ICD-10-CM | POA: Diagnosis not present

## 2021-08-19 DIAGNOSIS — L03119 Cellulitis of unspecified part of limb: Secondary | ICD-10-CM

## 2021-08-19 MED ORDER — DIPHENOXYLATE-ATROPINE 2.5-0.025 MG PO TABS: 1.0000 | ORAL_TABLET | Freq: Four times a day (QID) | ORAL | 0 refills | Status: DC | PRN

## 2021-08-19 MED ORDER — DOXYCYCLINE HYCLATE 100 MG PO TABS: 100.0000 mg | ORAL_TABLET | Freq: Two times a day (BID) | ORAL | 0 refills | Status: DC

## 2021-08-19 NOTE — Progress Notes (Signed)
? ?Subjective:  ? ? Patient ID: Susan [name], female    DOB: 17-Mar-1951, 71 y.o.   MRN: [id number] ? ?Please see photograph below.  Both legs are swollen despite her wearing compression hose.  The skin is bright red and erythematous.  This is well beyond her baseline.  It is hot to the touch.  There are weeping sores anteriorly leak weeping serous fluid.  She reports feeling sick and tired and weak.  She denies any fevers or chills.  She reports diarrhea.  She states she has had diarrhea now for almost a weeks.  She has been treated for cellulitis as well as a urinary tract infection recently. ?Past Medical History:  ?Diagnosis Date  ?? Acute MI ([place]) 1999; 2007  ?? Anemia   ? hx  ?? Anginal pain ([place])   ?? Anxiety   ?? ARF (acute renal failure) ([place]) 06/2017  ? [place]  ?? Arthritis   ? "generalized" (03/15/2014)  ?? CAD (coronary artery disease)   ? MI in 2000 - MI  2007 - treated bare metal stent (no nuclear since then as 9/11)  ?? Carotid artery disease ([place])   ?? CHF (congestive heart failure) ([place])   ?? Chronic diastolic heart failure ([place])   ? a) ECHO (08/2013) EF 55-60% and RV function nl b) RHC (08/2013) RA 4, RV 30/5/7, PA 25/10 (16), PCWP 7, Fick CO/CI 6.3/2.7, PVR 1.5 WU, PA 61 and 66%  ?? Daily headache   ? "~ every other day; since I fell in June" (03/15/2014)  ?? Depression   ?? Dyslipidemia   ?? Dyspnea   ?? Exertional shortness of breath   ?? HTN (hypertension)   ?? Hypothyroidism   ?? Neuropathy   ?? Obesity   ?? Osteoarthritis   ?? Peripheral neuropathy   ?? PONV (postoperative nausea and vomiting)   ?? RBBB (right bundle branch block)   ? Old  ?? Stroke [facility])   ? mini strokes  ?? Syncope   ? likely due to low blood sugar  ?? Tachycardia   ? Sinus tachycardia  ?? Type II diabetes mellitus ([place])   ? Type II  ?? Urinary incontinence   ?? Venous insufficiency   ? ?Past Surgical History:  ?Procedure Laterality Date  ?? ABDOMINAL HYSTERECTOMY  1980's  ?? AMPUTATION Right 02/24/2018  ?  Procedure: RIGHT FOOT GREAT TOE AND 2ND TOE AMPUTATION;  Surgeon: Newt Minion, MD;  Location: [place];  Service: Orthopedics;  Laterality: Right;  ?? AMPUTATION Right 04/30/2018  ? Procedure: RIGHT TRANSMETATARSAL AMPUTATION;  Surgeon: Newt Minion, MD;  Location: [place];  Service: Orthopedics;  Laterality: Right;  ?? BIOPSY  05/27/2020  ? Procedure: BIOPSY;  Surgeon: [name], DO;  [facility];  Service: Endoscopy;;  ?? CATARACT EXTRACTION, BILATERAL Bilateral ?2013  ?? COLONOSCOPY W/ POLYPECTOMY    ?? COLONOSCOPY WITH PROPOFOL N/A 03/13/2019  ? Procedure: COLONOSCOPY WITH PROPOFOL;  Surgeon: Jerene Bears, MD;  Location: [place];  Service: Gastroenterology;  Laterality: N/A;  ?? CORONARY ANGIOPLASTY WITH STENT PLACEMENT  1999; 2007  ? "1 + 1"  ?? ESOPHAGOGASTRODUODENOSCOPY (EGD) WITH PROPOFOL N/A 03/13/2019  ? Procedure: ESOPHAGOGASTRODUODENOSCOPY (EGD) WITH PROPOFOL;  Surgeon: Jerene Bears, MD;  [facility];  Service: Gastroenterology;  Laterality: N/A;  ?? ESOPHAGOGASTRODUODENOSCOPY (EGD) WITH PROPOFOL N/A 05/27/2020  ? Procedure: ESOPHAGOGASTRODUODENOSCOPY (EGD) WITH PROPOFOL;  Surgeon: [name], DO;  [facility];  Service: Endoscopy;  Laterality: N/A;  ??  EYE SURGERY Bilateral   ? lazer  ?? HEMOSTASIS CLIP PLACEMENT  03/13/2019  ? Procedure: HEMOSTASIS CLIP PLACEMENT;  Surgeon: Jerene Bears, MD;  Location: [facility] ENDOSCOPY;  Service: Gastroenterology;;  ?? KNEE ARTHROSCOPY Left 10/25/2006  ?? POLYPECTOMY  03/13/2019  ? Procedure: POLYPECTOMY;  Surgeon: Jerene Bears, MD;  Location: [place];  Service: Gastroenterology;;  ?? RIGHT HEART CATH N/A 07/24/2017  ? Procedure: RIGHT HEART CATH;  Surgeon: Jolaine Artist, MD;  Location: [place] CV LAB;  Service: Cardiovascular;  Laterality: N/A;  ?? RIGHT HEART CATHETERIZATION N/A 09/22/2013  ? Procedure: RIGHT HEART CATH;  Surgeon: Jolaine Artist, MD;  Location: [facility] CATH LAB;  Service: Cardiovascular;   Laterality: N/A;  ?? SHOULDER ARTHROSCOPY WITH OPEN ROTATOR CUFF REPAIR Right 03/14/2014  ? Procedure: RIGHT SHOULDER ARTHROSCOPY WITH BICEPS RELEASE, OPEN SUBSCAPULA REPAIR, OPEN SUPRASPINATUS REPAIR.;  Surgeon: Meredith Pel, MD;  Location: [place];  Service: Orthopedics;  Laterality: Right;  ?? TOE AMPUTATION Right 02/24/2018  ? GREAT TOE AND 2ND TOE AMPUTATION  ?? TUBAL LIGATION  1970's  ? ?Current Outpatient Medications on File Prior to Visit  ?Medication Sig Dispense Refill  ?? acetaminophen (TYLENOL) 500 MG tablet Take 1,000 mg by mouth every 6 (six) hours as needed for moderate pain or headache.    ?? albuterol (VENTOLIN HFA) 108 (90 Base) MCG/ACT inhaler TAKE 2 PUFFS BY MOUTH EVERY 6 HOURS AS NEEDED FOR WHEEZE OR SHORTNESS OF BREATH 8.5 g 1  ?? allopurinol (ZYLOPRIM) 100 MG tablet Take 0.5 tablets (50 mg total) by mouth daily. 30 tablet 0  ?? Ascorbic Acid (VITAMIN C) 1000 MG tablet Take 1,000 mg by mouth daily.    ?? aspirin EC 81 MG tablet Take 1 tablet (81 mg total) by mouth daily with breakfast. 30 tablet 11  ?? atorvastatin (LIPITOR) 80 MG tablet Take 1 tablet (80 mg total) by mouth daily. 30 tablet 5  ?? carvedilol (COREG) 12.5 MG tablet TAKE 1 TABLET (12.5 MG TOTAL) BY MOUTH 2 (TWO) TIMES DAILY WITH A MEAL. 180 tablet 1  ?? cholecalciferol (VITAMIN D) 1000 units tablet Take 1,000 Units by mouth daily with supper.     ?? clopidogrel (PLAVIX) 75 MG tablet TAKE 1 TABLET BY MOUTH EVERY DAY WITH BREAKFAST 90 tablet 3  ?? Cranberry 250 MG TABS Take 500 mg by mouth daily.    ?? ferrous sulfate 325 (65 FE) MG tablet Take 1 tablet (325 mg total) by mouth daily with breakfast. Reported on 08/21/2015 60 tablet 2  ?? guaiFENesin-dextromethorphan (ROBITUSSIN DM) 100-10 MG/5ML syrup Take 10 mLs by mouth every 4 (four) hours as needed for cough. 118 mL 0  ?? hydrocortisone cream 0.5 % Apply 1 application topically 2 (two) times daily. 30 g 0  ?? Insulin Disposable Pump (OMNIPOD DASH 5 PACK PODS) MISC Inject into  the skin See admin instructions. Use continuously with Novolin R - change every 72 hours.    ?? insulin NPH Human (HUMULIN N,NOVOLIN N) 100 UNIT/ML injection Inject 15 Units into the skin See admin instructions. Inject 15 units subcutaneously in the morning if CBG >300;    ?? insulin regular (NOVOLIN R) 100 units/mL injection Inject into the skin See admin instructions. Manually add bolus to continuous dose via OmniPod 3 times daily per sliding scale (CBG 80-150 7 units, 151-200 9 units, 201-250 12 units, 251-300 14 units, 301- 400 17 units)    ?? isosorbide mononitrate (IMDUR) 30 MG 24 hr tablet Take 0.5 tablets (15  mg total) by mouth daily. 30 tablet 6  ?? levothyroxine (SYNTHROID, LEVOTHROID) 50 MCG tablet Take 1 tablet (50 mcg total) by mouth daily before breakfast. 90 tablet 1  ?? magnesium oxide (MAG-OX) 400 (240 Mg) MG tablet Take 1 tablet (400 mg total) by mouth daily. 90 tablet 3  ?? Multiple Vitamins-Minerals (AIRBORNE GUMMIES PO) Take 2 tablets by mouth every evening.    ?? Multiple Vitamins-Minerals (CENTRUM SILVER 50+WOMEN) TABS Take 1 tablet by mouth every evening.    ?? nitroGLYCERIN (NITROSTAT) 0.4 MG SL tablet PLACE 1 TABLET (0.4 MG TOTAL) UNDER THE TONGUE EVERY 5 (FIVE) MINUTES AS NEEDED FOR CHEST PAIN. 25 tablet 1  ?? ONETOUCH VERIO test strip 3 (three) times daily.    ?? polyethylene glycol (MIRALAX / GLYCOLAX) 17 g packet 1 packet mixed with 8 ounces of fluid    ?? potassium chloride SA (KLOR-CON M20) 20 MEQ tablet Take 1 tablet (20 mEq total) by mouth 2 (two) times a week. Monday and Friday 30 tablet 1  ?? pregabalin (LYRICA) 150 MG capsule Take 150 mg by mouth 2 (two) times daily.    ?? sitaGLIPtin (JANUVIA) 25 MG tablet Take 1 tablet (25 mg total) by mouth daily. 90 tablet 3  ?? torsemide (DEMADEX) 20 MG tablet Take 80 mg by mouth 2 (two) times daily.    ?? ursodiol (ACTIGALL) 500 MG tablet Take 500 mg by mouth 3 (three) times daily.    ?? vitamin B-12 (CYANOCOBALAMIN) 1000 MCG tablet Take  1,000 mcg by mouth daily.    ?? vortioxetine HBr (TRINTELLIX) 10 MG TABS tablet Take 1 tablet (10 mg total) by mouth daily. 30 tablet 5  ?? zinc gluconate 50 MG tablet Take 50 mg by mouth daily.    ?? metolazone

## 2021-08-20 ENCOUNTER — Telehealth: Payer: Self-pay

## 2021-08-20 NOTE — Telephone Encounter (Signed)
Pt called in wanting to ask pcp how many torsemide (DEMADEX) 20 MG tablet he wants her to take? Please advise. ? ?Cb#: [phone] ? ?

## 2021-08-21 NOTE — Telephone Encounter (Signed)
Called pt regard directions for torsmide per Dr. Dennard Schaumann. Pt voiced understanding. Nothing at this time ?

## 2021-08-22 ENCOUNTER — Telehealth: Payer: HMO

## 2021-08-22 ENCOUNTER — Ambulatory Visit (INDEPENDENT_AMBULATORY_CARE_PROVIDER_SITE_OTHER): Payer: HMO | Admitting: Family Medicine

## 2021-08-22 VITALS — BP 128/62 | HR 83 | Temp 97.1°F | Ht 66.0 in | Wt 259.0 lb

## 2021-08-22 DIAGNOSIS — M7989 Other specified soft tissue disorders: Secondary | ICD-10-CM

## 2021-08-22 DIAGNOSIS — R197 Diarrhea, unspecified: Secondary | ICD-10-CM | POA: Diagnosis not present

## 2021-08-22 DIAGNOSIS — L03119 Cellulitis of unspecified part of limb: Secondary | ICD-10-CM | POA: Diagnosis not present

## 2021-08-22 NOTE — Progress Notes (Signed)
? ?Subjective:  ? ? Patient ID: Susan [name], female    DOB: Jan 24, 1951, 71 y.o.   MRN: [id number] ? ?Please see photograph below.  Both legs are swollen despite her wearing compression hose.  The skin is bright red and erythematous.  This is well beyond her baseline.  It is hot to the touch.  There are weeping sores anteriorly leak weeping serous fluid.  She reports feeling sick and tired and weak.  She denies any fevers or chills.  She reports diarrhea.  She states she has had diarrhea now for almost a weeks.  She has been treated for cellulitis as well as a urinary tract infection recently.  At that time, my plan was: ? ?Patient was recently been on Cipro for pyelonephritis.  Therefore I will check her stool for C. difficile to rule out C. difficile colitis as a cause of the diarrhea.  However is been going on now for a week so I suspect that its not.  Instead I will treat her with Lomotil 2 tablets every 6 hours as needed for diarrhea.  I will treat the cellulitis with doxycycline 100 mg p.o. twice daily for 7 days to cover staph.  Meanwhile treat the swelling in her legs and chronic venous insufficiency with Unna boots bilaterally. ? ?08/22/21 ?Erythema has faded substantially.  There is no additional redness or warmth.  There are chronic venous stasis changes in both legs but there is no evidence of cellulitis.  Antibiotics seem to be working.  The patient does still have 2 lesions on the left leg and 2 lesions on the right leg that are bleeding.  Each is a roughly 1 cm in diameter and paper deep/very shallow. ?Past Medical History:  ?Diagnosis Date  ? Acute MI [facility]) 1999; 2007  ? Anemia   ? hx  ? Anginal pain ([place])   ? Anxiety   ? ARF (acute renal failure) ([place]) 06/2017  ? [facility]  ? Arthritis   ? "generalized" (03/15/2014)  ? CAD (coronary artery disease)   ? MI in 2000 - MI  2007 - treated bare metal stent (no nuclear since then as 9/11)  ? Carotid artery disease ([place])   ? CHF (congestive heart  failure) ([place])   ? Chronic diastolic heart failure ([place])   ? a) ECHO (08/2013) EF 55-60% and RV function nl b) RHC (08/2013) RA 4, RV 30/5/7, PA 25/10 (16), PCWP 7, Fick CO/CI 6.3/2.7, PVR 1.5 WU, PA 61 and 66%  ? Daily headache   ? "~ every other day; since I fell in June" (03/15/2014)  ? Depression   ? Dyslipidemia   ? Dyspnea   ? Exertional shortness of breath   ? HTN (hypertension)   ? Hypothyroidism   ? Neuropathy   ? Obesity   ? Osteoarthritis   ? Peripheral neuropathy   ? PONV (postoperative nausea and vomiting)   ? RBBB (right bundle branch block)   ? Old  ? Stroke [facility])   ? mini strokes  ? Syncope   ? likely due to low blood sugar  ? Tachycardia   ? Sinus tachycardia  ? Type II diabetes mellitus ([place])   ? Type II  ? Urinary incontinence   ? Venous insufficiency   ? ?Past Surgical History:  ?Procedure Laterality Date  ? ABDOMINAL HYSTERECTOMY  1980's  ? AMPUTATION Right 02/24/2018  ? Procedure: RIGHT FOOT GREAT TOE AND 2ND TOE AMPUTATION;  Surgeon: Newt Minion, MD;  Location: [facility]  OR;  Service: Orthopedics;  Laterality: Right;  ? AMPUTATION Right 04/30/2018  ? Procedure: RIGHT TRANSMETATARSAL AMPUTATION;  Surgeon: Newt Minion, MD;  Location: [place];  Service: Orthopedics;  Laterality: Right;  ? BIOPSY  05/27/2020  ? Procedure: BIOPSY;  Surgeon: [name], DO;  [facility];  Service: Endoscopy;;  ? CATARACT EXTRACTION, BILATERAL Bilateral ?2013  ? COLONOSCOPY W/ POLYPECTOMY    ? COLONOSCOPY WITH PROPOFOL N/A 03/13/2019  ? Procedure: COLONOSCOPY WITH PROPOFOL;  Surgeon: Jerene Bears, MD;  Location[place];  Service: Gastroenterology;  Laterality: N/A;  ? CORONARY ANGIOPLASTY WITH STENT PLACEMENT  1999; 2007  ? "1 + 1"  ? ESOPHAGOGASTRODUODENOSCOPY (EGD) WITH PROPOFOL N/A 03/13/2019  ? Procedure: ESOPHAGOGASTRODUODENOSCOPY (EGD) WITH PROPOFOL;  Surgeon: Jerene Bears, MD;  Location: [facility] ENDOSCOPY;  Service: Gastroenterology;  Laterality: N/A;  ? ESOPHAGOGASTRODUODENOSCOPY (EGD) WITH  PROPOFOL N/A 05/27/2020  ? Procedure: ESOPHAGOGASTRODUODENOSCOPY (EGD) WITH PROPOFOL;  Surgeon: [name], DO;  [facility];  Service: Endoscopy;  Laterality: N/A;  ? EYE SURGERY Bilateral   ? lazer  ? HEMOSTASIS CLIP PLACEMENT  03/13/2019  ? Procedure: HEMOSTASIS CLIP PLACEMENT;  Surgeon: Jerene Bears, MD;  Location: [facility] ENDOSCOPY;  Service: Gastroenterology;;  ? KNEE ARTHROSCOPY Left 10/25/2006  ? POLYPECTOMY  03/13/2019  ? Procedure: POLYPECTOMY;  Surgeon: Jerene Bears, MD;  Location: [place];  Service: Gastroenterology;;  ? RIGHT HEART CATH N/A 07/24/2017  ? Procedure: RIGHT HEART CATH;  Surgeon: Jolaine Artist, MD;  Location: [place] CV LAB;  Service: Cardiovascular;  Laterality: N/A;  ? RIGHT HEART CATHETERIZATION N/A 09/22/2013  ? Procedure: RIGHT HEART CATH;  Surgeon: Jolaine Artist, MD;  Location: [facility] CATH LAB;  Service: Cardiovascular;  Laterality: N/A;  ? SHOULDER ARTHROSCOPY WITH OPEN ROTATOR CUFF REPAIR Right 03/14/2014  ? Procedure: RIGHT SHOULDER ARTHROSCOPY WITH BICEPS RELEASE, OPEN SUBSCAPULA REPAIR, OPEN SUPRASPINATUS REPAIR.;  Surgeon: Meredith Pel, MD;  Location: [place];  Service: Orthopedics;  Laterality: Right;  ? TOE AMPUTATION Right 02/24/2018  ? GREAT TOE AND 2ND TOE AMPUTATION  ? TUBAL LIGATION  1970's  ? ?Current Outpatient Medications on File Prior to Visit  ?Medication Sig Dispense Refill  ? acetaminophen (TYLENOL) 500 MG tablet Take 1,000 mg by mouth every 6 (six) hours as needed for moderate pain or headache.    ? albuterol (VENTOLIN HFA) 108 (90 Base) MCG/ACT inhaler TAKE 2 PUFFS BY MOUTH EVERY 6 HOURS AS NEEDED FOR WHEEZE OR SHORTNESS OF BREATH 8.5 g 1  ? allopurinol (ZYLOPRIM) 100 MG tablet Take 0.5 tablets (50 mg total) by mouth daily. 30 tablet 0  ? Ascorbic Acid (VITAMIN C) 1000 MG tablet Take 1,000 mg by mouth daily.    ? aspirin EC 81 MG tablet Take 1 tablet (81 mg total) by mouth daily with breakfast. 30 tablet 11  ? atorvastatin (LIPITOR)  80 MG tablet Take 1 tablet (80 mg total) by mouth daily. 30 tablet 5  ? carvedilol (COREG) 12.5 MG tablet TAKE 1 TABLET (12.5 MG TOTAL) BY MOUTH 2 (TWO) TIMES DAILY WITH A MEAL. 180 tablet 1  ? cholecalciferol (VITAMIN D) 1000 units tablet Take 1,000 Units by mouth daily with supper.     ? clopidogrel (PLAVIX) 75 MG tablet TAKE 1 TABLET BY MOUTH EVERY DAY WITH BREAKFAST 90 tablet 3  ? Cranberry 250 MG TABS Take 500 mg by mouth daily.    ? diphenoxylate-atropine (LOMOTIL) 2.5-0.025 MG tablet Take 1 tablet by mouth  4 (four) times daily as needed for diarrhea or loose stools. 30 tablet 0  ? doxycycline (VIBRA-TABS) 100 MG tablet Take 1 tablet (100 mg total) by mouth 2 (two) times daily. 14 tablet 0  ? guaiFENesin-dextromethorphan (ROBITUSSIN DM) 100-10 MG/5ML syrup Take 10 mLs by mouth every 4 (four) hours as needed for cough. 118 mL 0  ? hydrocortisone cream 0.5 % Apply 1 application topically 2 (two) times daily. 30 g 0  ? Insulin Disposable Pump (OMNIPOD DASH 5 PACK PODS) MISC Inject into the skin See admin instructions. Use continuously with Novolin R - change every 72 hours.    ? insulin NPH Human (HUMULIN N,NOVOLIN N) 100 UNIT/ML injection Inject 15 Units into the skin See admin instructions. Inject 15 units subcutaneously in the morning if CBG >300;    ? insulin regular (NOVOLIN R) 100 units/mL injection Inject into the skin See admin instructions. Manually add bolus to continuous dose via OmniPod 3 times daily per sliding scale (CBG 80-150 7 units, 151-200 9 units, 201-250 12 units, 251-300 14 units, 301- 400 17 units)    ? isosorbide mononitrate (IMDUR) 30 MG 24 hr tablet Take 0.5 tablets (15 mg total) by mouth daily. 30 tablet 6  ? levothyroxine (SYNTHROID, LEVOTHROID) 50 MCG tablet Take 1 tablet (50 mcg total) by mouth daily before breakfast. 90 tablet 1  ? magnesium oxide (MAG-OX) 400 (240 Mg) MG tablet Take 1 tablet (400 mg total) by mouth daily. 90 tablet 3  ? Multiple Vitamins-Minerals (AIRBORNE GUMMIES  PO) Take 2 tablets by mouth every evening.    ? Multiple Vitamins-Minerals (CENTRUM SILVER 50+WOMEN) TABS Take 1 tablet by mouth every evening.    ? nitroGLYCERIN (NITROSTAT) 0.4 MG SL tablet PLACE 1 TABLE

## 2021-08-23 ENCOUNTER — Telehealth: Payer: HMO

## 2021-08-23 ENCOUNTER — Telehealth: Payer: Self-pay | Admitting: Pharmacist

## 2021-08-23 LAB — C. DIFFICILE GDH AND TOXIN A/B
GDH ANTIGEN: NOT DETECTED
MICRO NUMBER:: 13201904
SPECIMEN QUALITY:: ADEQUATE
TOXIN A AND B: NOT DETECTED

## 2021-08-23 NOTE — Progress Notes (Deleted)
?Chronic Care Management ?Pharmacy Note ? ?05/07/2021 ?Name:  Susan [name] MRN:  [id number] DOB:  05/25/1951 ? ?Summary: ?Initial visit with PharmD.  DM severely uncontrolled.  She reports she is just giving herself bolus through omnipod.  Unclear if there is a basal rate.  She also cannot afford Trintellix. ? ?Recommendations/Changes made from today's visit: ?Substantial counseling on DM diet - patient to work on decreasing fast food and gatorades, soda ? ?Plan: ?FU 3 months ? ? ?Subjective: ?Susan [name] is an 71 y.o. year old female who is a primary patient of [name], [name], MD.  The CCM team was consulted for assistance with disease management and care coordination needs.   ? ?Engaged with patient by telephone for initial visit in response to provider referral for pharmacy case management and/or care coordination services.  ? ?Consent to Services:  ?The patient was given the following information about Chronic Care Management services today, agreed to services, and gave verbal consent: 1. CCM service includes personalized support from designated clinical staff supervised by the primary care provider, including individualized plan of care and coordination with other care providers 2. 24/7 contact phone numbers for assistance for urgent and routine care needs. 3. Service will only be billed when office clinical staff spend 20 minutes or more in a month to coordinate care. 4. Only one practitioner may furnish and bill the service in a calendar month. 5.The patient may stop CCM services at any time (effective at the end of the month) by phone call to the office staff. 6. The patient will be responsible for cost sharing (co-pay) of up to 20% of the service fee (after annual deductible is met). Patient agreed to services and consent obtained. ? ?Patient Care Team: ?[name], MD as PCP - General (Family Medicine) ?Bensimhon, Shaune Pascal, MD as PCP - Cardiology (Cardiology) ?Jacelyn Pi, MD  (Endocrinology) ?Fluech, Nevin Bloodgood, PA-C (Neurology) ?Newt Minion, MD as Consulting Physician (Orthopedic Surgery) ?Jorge Ny, LCSW as Education officer, museum (Licensed Holiday representative) ?Kassie Mends, RN as [place] Management ?Edythe Clarity, [facility] as Pharmacist (Pharmacist) ? ?Recent office visits:  ?  ?04/04/21 [name], MD (PCP) - Family Medicine - Stage 4 CKD -Labs were ordered. vortioxetine HBr (TRINTELLIX) 10 MG TABS tablet take 1 tablet (10 mg total) by mouth daily prescribed. Follow up as scheduled. ?  ?03/15/21 [name], MD (PCP) - Family Medicine - Stage 4 CKD - Labs were ordered. Referral for Home health placed. Pregabalin (LYRICA) 50 MG capsule take 1 capsule (50 mg total) by mouth 2 (two) times daily ordered.  Discontinue Prozac and switch to Trintellix 10 mg a day and recheck in 3 weeks. ?  ?03/05/21 [name], MD (PCP) - Family Medicine - Dyspnea - Labs were ordered. EKG performed in office. shortness of breath is related to cardiac ischemia.  Patient refuses to go to the hospital [place].  After much discussion, I was able to convince the patient to go to the hospital but she adamantly refuses to allow me to call an ambulance.  I do not feel that the patient is in a life-threatening emergency at this moment.  Therefore I will compromise with the patient and allow her to drive herself to the local emergency room for further evaluation.  ?  ?12/17/20 [name], MD (PCP) - Family Medicine - Blood blister - Recommended continue dressing changes for the remainder of this week and then resume normal behavior.  She  can reduce her torsemide back to 5 tablets in the morning and 4 tablets in the evening as the swelling is back to her baseline. Follow up as scheduled.  ?  ?12/13/20 [name], MD (PCP) - Family Medicine - Blood blister  - aspirated the contents of the blood blister with a 18-gauge needle and a 20 cc syringe.  I was able  to completely empty the blood blister.  I then coated the wound with Silvadene, nonadherent gauze, and then wrapped the foot with Coban.  Recommended daily dressing changes.  Recheck next week.  Increase torsemide 100 mg twice daily until seen on Monday due to increased weight and edema ?  ?11/15/20 [name], MD (PCP) - Family Medicine - Chronic Diastolic CHF - Labs were ordered. cephALEXin (KEFLEX) 250 MG capsule take 1 capsule (250 mg total) by mouth daily prescribed. anesthetized the seborrheic keratosis on the left side of her neck and removed it using sterile technique with a razor blade. Continue the patient on torsemide 100 mg in the morning and 80 mg in the evening  We discussed the options and the patient would like to try low-dose Keflex 250 mg daily despite that it may have caused diarrhea in the past for prevention of other urinary tract infections to see if this helps.  If she develops diarrhea she will discontinue it immediately. ?  ?  ?Recent consult visits:  ?  ?04/09/21 Meridee Score, MD - Orthopedic surgery - Onychomycosis - No medication changes. Follow up in 3 months.  ?  ?01/07/21 Meridee Score, MD - Orthopedic surgery- Idiopathic chronic venous hypertension of both lower extremities - Recommended patient wear the extra-large compression stocking she is currently wearing the double extra-large.  The socks were applied for her.  For her legs recommended exercise elevation and compression.Follow up in 3 months.  ?  ?11/12/20  Meridee Score, MD - Orthopedic surgery- Idiopathic chronic venous hypertension of both lower extremities - No medication changes. Follow up in 3 months.  ?  ?  ?Hospital visits: 02/05/2021 ?  ?Medication Reconciliation was completed by comparing discharge summary, patient?s EMR and Pharmacy list, and upon discussion with patient. ?  ?Admitted to the hospital on 02/05/21 due to Diabetic ketoacidosis. Discharge date was 02/09/21. Discharged from [facility].   ?   ?New?Medications Started at [facility] Discharge:?? ?Azithromycin (ZITHROMAX) 500 MG tablet and loperamide (IMODIUM) 2 MG capsule prescribed. ?  ?Medication Changes at Hospital Discharge: ?Changed Torsemide to 75m Take 2 tablets (40 mg total) by mouth 2 (two) times daily. ?  ?Medications Discontinued at Hospital Discharge: ?Metolazone 2.5 and Potassium Chloride 20 meq ?  ?Medications that remain the same after Hospital Discharge:??  ?All other medications will remain the same.   ?  ?Hospital visits: 03/05/21 ?  ?Medication Reconciliation was completed by comparing discharge summary, patient?s EMR and Pharmacy list, and upon discussion with patient. ?  ?Admitted to the hospital on 03/05/21 due to NSTEMI. Discharge date was 03/10/21. [facility]   ?  ?[facility] ?atorvastatin (LIPITOR) 80 MG tablet, isosorbide mononitrate (IMDUR) 30 MG 24 hr tablet, linagliptin (TRADJENTA) 5 MG TABS tablet, hydrALAZINE (APRESOLINE) 10 MG tablet, fosfomycin (MONUROL) 3 g PACK were prescribed. ?  ?Medication Changes at Hospital Discharge: ?ferrous sulfate 325 (65 FE) MG tablet and magnesium oxide (MAG-OX) 400 MG tablet were changed. ?  ?Medications Discontinued at Hospital Discharge: ?Lyrica, Trazodone, and Keflex were discontinued.  ?  ?Medications that remain the same  after Hospital Discharge:??  ?All other medications will remain the same.   ?  ? ? ?Objective: ? ?Lab Results  ?Component Value Date  ? CREATININE 4.32 (H) 05/06/2021  ? BUN 103 (H) 05/06/2021  ? GFR 41.58 (L) 08/17/2013  ? GFRNONAA 10 (L) 05/06/2021  ? GFRAA 13 (L) 11/15/2020  ? NA 134 (L) 05/06/2021  ? K 3.4 (L) 05/06/2021  ? CALCIUM 8.4 (L) 05/06/2021  ? CO2 26 05/06/2021  ? GLUCOSE 419 (H) 05/06/2021  ? ? ?Lab Results  ?Component Value Date/Time  ? HGBA1C 12.2 (H) 03/05/2021 05:34 PM  ? HGBA1C 10.3 (H) 08/26/2020 08:01 AM  ? GFR 41.58 (L) 08/17/2013 02:46 PM  ? GFR 61.93 01/17/2011 02:36 PM  ? MICROALBUR  287.8 06/29/2020 01:58 PM  ? MICROALBUR 65.6 11/14/2016 09:25 AM  ?  ?Last diabetic Eye exam:  ?Lab Results  ?Component Value Date/Time  ? HMDIABEYEEXA Retinopathy (A) 09/23/2017 12:00 AM  ?  ?Last diabetic Foot exam: No results f

## 2021-08-23 NOTE — Chronic Care Management (AMB) (Signed)
?  Chronic Care Management  ? ?Outreach Note ? ?08/23/2021 ?Name: Ambert Virrueta MRN: [id number] DOB: September 11, 1950 ? ?Referred by: [name], MD ?Reason for referral : No chief complaint on file. ? ? ?An unsuccessful telephone outreach was attempted today. The patient was referred to the pharmacist for assistance with care management and care coordination.  ? ?Follow Up Plan: If she returns call please have patient call [phone] ? ? ?[name] Milch, PharmD, CPP ?Clinical Pharmacist Practitioner ?[place] ?[phone] ? ?

## 2021-08-28 ENCOUNTER — Telehealth: Payer: Self-pay | Admitting: Pharmacist

## 2021-08-28 DIAGNOSIS — H43813 Vitreous degeneration, bilateral: Secondary | ICD-10-CM | POA: Diagnosis not present

## 2021-08-28 DIAGNOSIS — E113511 Type 2 diabetes mellitus with proliferative diabetic retinopathy with macular edema, right eye: Secondary | ICD-10-CM | POA: Diagnosis not present

## 2021-08-28 DIAGNOSIS — E113513 Type 2 diabetes mellitus with proliferative diabetic retinopathy with macular edema, bilateral: Secondary | ICD-10-CM | POA: Diagnosis not present

## 2021-08-28 DIAGNOSIS — H3582 Retinal ischemia: Secondary | ICD-10-CM | POA: Diagnosis not present

## 2021-08-28 DIAGNOSIS — H43392 Other vitreous opacities, left eye: Secondary | ICD-10-CM | POA: Diagnosis not present

## 2021-08-28 DIAGNOSIS — E113512 Type 2 diabetes mellitus with proliferative diabetic retinopathy with macular edema, left eye: Secondary | ICD-10-CM | POA: Diagnosis not present

## 2021-08-28 NOTE — Progress Notes (Signed)
? ? ?Chronic Care Management ?Pharmacy Assistant  ? ?Name: Susan [name]  MRN: [id number] DOB: 1951/02/16 ? ? ?Reason for Encounter: Disease State - General Adherence Call ?  ? ?Recent office visits:  ?None noted. ? ?Recent consult visits:  ?None noted.  ? ? ?Hospital visits: 03/05/21 - 03/10/21 ?Medication Reconciliation was completed by comparing discharge summary, patient?s EMR and Pharmacy list, and upon discussion with patient. ?  ?Admitted to the hospital on 03/05/21 due to NSTEMI. Discharge date was 03/10/21. Discharged from [facility].   ?  ?New?Medications Started at [facility] Discharge:?? ?atorvastatin (LIPITOR) ?fosfomycin (MONUROL) ?hydrALAZINE (APRESOLINE) ?hydrocortisone cream ?isosorbide mononitrate (IMDUR) ?linagliptin (TRADJENTA) ?  ?  ?Medication Changes at Hospital Discharge: ?allopurinol (Zyloprim) ?ferrous sulfate ?magnesium oxide (MAG-OX) ?  ?Medications Discontinued at Hospital Discharge: ?cephALEXin 250 MG capsule (KEFLEX) ?pregabalin 150 MG capsule (Lyrica) ?traZODone 100 MG tablet (DESYREL) ?  ?Medications that remain the same after Hospital Discharge:??  ?All other medications will remain the same ? ?Medications: ?Outpatient Encounter Medications as of 08/28/2021  ?Medication Sig Note  ? acetaminophen (TYLENOL) 500 MG tablet Take 1,000 mg by mouth every 6 (six) hours as needed for moderate pain or headache.   ? albuterol (VENTOLIN HFA) 108 (90 Base) MCG/ACT inhaler TAKE 2 PUFFS BY MOUTH EVERY 6 HOURS AS NEEDED FOR WHEEZE OR SHORTNESS OF BREATH   ? allopurinol (ZYLOPRIM) 100 MG tablet Take 0.5 tablets (50 mg total) by mouth daily.   ? Ascorbic Acid (VITAMIN C) 1000 MG tablet Take 1,000 mg by mouth daily.   ? aspirin EC 81 MG tablet Take 1 tablet (81 mg total) by mouth daily with breakfast.   ? atorvastatin (LIPITOR) 80 MG tablet Take 1 tablet (80 mg total) by mouth daily.   ? carvedilol (COREG) 12.5 MG tablet TAKE 1 TABLET (12.5 MG TOTAL) BY MOUTH 2 (TWO) TIMES DAILY WITH A MEAL.   ?  cholecalciferol (VITAMIN D) 1000 units tablet Take 1,000 Units by mouth daily with supper.    ? clopidogrel (PLAVIX) 75 MG tablet TAKE 1 TABLET BY MOUTH EVERY DAY WITH BREAKFAST   ? Cranberry 250 MG TABS Take 500 mg by mouth daily.   ? diphenoxylate-atropine (LOMOTIL) 2.5-0.025 MG tablet Take 1 tablet by mouth 4 (four) times daily as needed for diarrhea or loose stools.   ? doxycycline (VIBRA-TABS) 100 MG tablet Take 1 tablet (100 mg total) by mouth 2 (two) times daily.   ? ferrous sulfate 325 (65 FE) MG tablet Take 1 tablet (325 mg total) by mouth daily with breakfast. Reported on 08/21/2015 (Patient not taking: Reported on 08/22/2021) 05/23/2021: Taking twice daily  ? guaiFENesin-dextromethorphan (ROBITUSSIN DM) 100-10 MG/5ML syrup Take 10 mLs by mouth every 4 (four) hours as needed for cough.   ? hydrocortisone cream 0.5 % Apply 1 application topically 2 (two) times daily.   ? Insulin Disposable Pump (OMNIPOD DASH 5 PACK PODS) MISC Inject into the skin See admin instructions. Use continuously with Novolin R - change every 72 hours.   ? insulin NPH Human (HUMULIN N,NOVOLIN N) 100 UNIT/ML injection Inject 15 Units into the skin See admin instructions. Inject 15 units subcutaneously in the morning if CBG >300;   ? insulin regular (NOVOLIN R) 100 units/mL injection Inject into the skin See admin instructions. Manually add bolus to continuous dose via OmniPod 3 times daily per sliding scale (CBG 80-150 7 units, 151-200 9 units, 201-250 12 units, 251-300 14 units, 301- 400 17 units)   ? isosorbide mononitrate (IMDUR) 30  MG 24 hr tablet Take 0.5 tablets (15 mg total) by mouth daily.   ? levothyroxine (SYNTHROID, LEVOTHROID) 50 MCG tablet Take 1 tablet (50 mcg total) by mouth daily before breakfast.   ? magnesium oxide (MAG-OX) 400 (240 Mg) MG tablet Take 1 tablet (400 mg total) by mouth daily.   ? metolazone (ZAROXOLYN) 2.5 MG tablet Take 1 tablet (2.5 mg total) by mouth daily. 05/23/2021: Taking once weekly  ? Multiple  Vitamins-Minerals (AIRBORNE GUMMIES PO) Take 2 tablets by mouth every evening.   ? Multiple Vitamins-Minerals (CENTRUM SILVER 50+WOMEN) TABS Take 1 tablet by mouth every evening.   ? nitroGLYCERIN (NITROSTAT) 0.4 MG SL tablet PLACE 1 TABLET (0.4 MG TOTAL) UNDER THE TONGUE EVERY 5 (FIVE) MINUTES AS NEEDED FOR CHEST PAIN.   ? ONETOUCH VERIO test strip 3 (three) times daily.   ? polyethylene glycol (MIRALAX / GLYCOLAX) 17 g packet 1 packet mixed with 8 ounces of fluid   ? potassium chloride SA (KLOR-CON M20) 20 MEQ tablet Take 1 tablet (20 mEq total) by mouth 2 (two) times a week. Monday and Friday   ? pregabalin (LYRICA) 150 MG capsule Take 150 mg by mouth 2 (two) times daily.   ? sitaGLIPtin (JANUVIA) 25 MG tablet Take 1 tablet (25 mg total) by mouth daily.   ? torsemide (DEMADEX) 20 MG tablet Take 80 mg by mouth 2 (two) times daily.   ? ursodiol (ACTIGALL) 500 MG tablet Take 500 mg by mouth 3 (three) times daily.   ? vitamin B-12 (CYANOCOBALAMIN) 1000 MCG tablet Take 1,000 mcg by mouth daily.   ? vortioxetine HBr (TRINTELLIX) 10 MG TABS tablet Take 1 tablet (10 mg total) by mouth daily.   ? zinc gluconate 50 MG tablet Take 50 mg by mouth daily.   ? ?No facility-administered encounter medications on file as of 08/28/2021.  ? ? ?Have you had any problems recently with your health? ?  ?  ?Have you had any problems with your pharmacy? ?  ?  ?What issues or side effects are you having with your medications? ?  ?  ?What would you like me to pass along to Leata Mouse, CPP for them to help you with?  ?  ?  ?What can we do to take care of you better? ?  ?  ?Care Gaps ?  ?AWV: done 08/16/21 ?Colonoscopy: done 03/13/19 ?DM Eye Exam: due 09/24/18 ?DM Foot Exam: unknown  ?Microalbumin: done 06/29/20 ?HbgAIC: done 08/26/20 (10.3) ?DEXA: unknown  ?Mammogram: done 11/22/15 ?  ?  ?Star Rating Drugs: ?Atorvastatin (LIPITOR) 80 MG tablet - last filled 06/22/21 90 days  ? ?  ?Future Appointments  ?Date Time Provider [place]  ?09/30/2021   2:45 PM Newt Minion, MD [facility] None  ?08/28/2022  3:30 PM [facility]-NURSE HEALTH ADVISOR [facility]  ? ? ?Multiple attempts were made to contact patient. Attempts were unsuccessful. / ls,CMA  ? ?Liza Showfety, CCMA ?Clinical Pharmacist Assistant  ?[phone] ? ?

## 2021-09-11 ENCOUNTER — Other Ambulatory Visit: Payer: Self-pay | Admitting: Family Medicine

## 2021-09-15 DIAGNOSIS — I5032 Chronic diastolic (congestive) heart failure: Secondary | ICD-10-CM | POA: Diagnosis not present

## 2021-09-16 DIAGNOSIS — Z9641 Presence of insulin pump (external) (internal): Secondary | ICD-10-CM | POA: Diagnosis not present

## 2021-09-16 DIAGNOSIS — G609 Hereditary and idiopathic neuropathy, unspecified: Secondary | ICD-10-CM | POA: Diagnosis not present

## 2021-09-16 DIAGNOSIS — E039 Hypothyroidism, unspecified: Secondary | ICD-10-CM | POA: Diagnosis not present

## 2021-09-16 DIAGNOSIS — E11319 Type 2 diabetes mellitus with unspecified diabetic retinopathy without macular edema: Secondary | ICD-10-CM | POA: Diagnosis not present

## 2021-09-16 DIAGNOSIS — I2510 Atherosclerotic heart disease of native coronary artery without angina pectoris: Secondary | ICD-10-CM | POA: Diagnosis not present

## 2021-09-16 DIAGNOSIS — N189 Chronic kidney disease, unspecified: Secondary | ICD-10-CM | POA: Diagnosis not present

## 2021-09-16 DIAGNOSIS — I10 Essential (primary) hypertension: Secondary | ICD-10-CM | POA: Diagnosis not present

## 2021-09-16 DIAGNOSIS — E1165 Type 2 diabetes mellitus with hyperglycemia: Secondary | ICD-10-CM | POA: Diagnosis not present

## 2021-09-16 DIAGNOSIS — R748 Abnormal levels of other serum enzymes: Secondary | ICD-10-CM | POA: Diagnosis not present

## 2021-09-16 DIAGNOSIS — E11621 Type 2 diabetes mellitus with foot ulcer: Secondary | ICD-10-CM | POA: Diagnosis not present

## 2021-09-16 DIAGNOSIS — R809 Proteinuria, unspecified: Secondary | ICD-10-CM | POA: Diagnosis not present

## 2021-09-17 ENCOUNTER — Telehealth: Payer: Self-pay | Admitting: *Deleted

## 2021-09-17 NOTE — Chronic Care Management (AMB) (Signed)
?  Care Management  ? ?Note ? ?09/17/2021 ?Name: Susan [name] MRN: [id number] DOB: 12/23/1950 ? ?Susan [name] is a 71 y.o. year old female who is a primary care patient of Susan Frizzle, MD and is actively engaged with the care management team. I reached out to [name] by phone today to assist with re-scheduling a follow up visit with the RN Case Manager ? ?Follow up plan: ?Unsuccessful telephone outreach attempt made. A HIPAA compliant phone message was left for the patient providing contact information and requesting a return call.  ?The care management team will reach out to the patient again over the next 7 days.  ?If patient returns call to provider office, please advise to call [place] at [phone]. ? ?Laverda Sorenson  ?Care Guide, Embedded Care Coordination ?[place]  Care Management  ?Direct Dial: [phone] ? ?

## 2021-09-20 ENCOUNTER — Other Ambulatory Visit: Payer: Self-pay

## 2021-09-20 ENCOUNTER — Other Ambulatory Visit (HOSPITAL_COMMUNITY): Payer: Self-pay | Admitting: Family Medicine

## 2021-09-20 ENCOUNTER — Other Ambulatory Visit: Payer: Self-pay | Admitting: Family Medicine

## 2021-09-23 ENCOUNTER — Telehealth (HOSPITAL_COMMUNITY): Payer: Self-pay | Admitting: *Deleted

## 2021-09-23 NOTE — Telephone Encounter (Signed)
Pt called and was extremely short of breath. Pt was gasping for air in between words. Pt said her abdomen is really tight and she feels really bad. Pt was advised to go to the emergency room. Pt said she did not want to go to the emergency room and wait and then they will admit her and keep her for a week. I encouraged pt multiple times that she sounds like she needs to be seen in the emergency room and that the doctors there can evaluate her. Pt said she just wanted an appointment in our clinic. I advised her Per Adline Potter pt needs to go to the emergency room as we are limited in an outpatient setting. Pt again stated she was not going to the emergency room she thanked me for my call and hung up the phone.  ?

## 2021-09-24 NOTE — Chronic Care Management (AMB) (Signed)
?  Care Management  ? ?Note ? ?09/24/2021 ?Name: Susan [name] MRN: [id number] DOB: January 09, 1951 ? ?Susan [name] is a 71 y.o. year old female who is a primary care patient of [name], MD and is actively engaged with the care management team. I reached out to [name] by phone today to assist with re-scheduling a follow up visit with the RN Case Manager ? ?Follow up plan: ?Unsuccessful telephone outreach attempt made. A HIPAA compliant phone message was left for the patient providing contact information and requesting a return call.  ?The care management team will reach out to the patient again over the next 7 days.  ?If patient returns call to provider office, please advise to call [place]  at [phone]. ? ?Laverda Sorenson  ?Care Guide, Embedded Care Coordination ?[place]  Care Management  ?Direct Dial: [phone] ? ?

## 2021-09-30 ENCOUNTER — Ambulatory Visit: Payer: HMO | Admitting: Orthopedic Surgery

## 2021-09-30 DIAGNOSIS — Z89431 Acquired absence of right foot: Secondary | ICD-10-CM

## 2021-09-30 DIAGNOSIS — B351 Tinea unguium: Secondary | ICD-10-CM

## 2021-09-30 DIAGNOSIS — I87333 Chronic venous hypertension (idiopathic) with ulcer and inflammation of bilateral lower extremity: Secondary | ICD-10-CM

## 2021-10-01 ENCOUNTER — Ambulatory Visit (INDEPENDENT_AMBULATORY_CARE_PROVIDER_SITE_OTHER): Payer: HMO | Admitting: *Deleted

## 2021-10-01 ENCOUNTER — Telehealth: Payer: Self-pay

## 2021-10-01 ENCOUNTER — Encounter: Payer: Self-pay | Admitting: Orthopedic Surgery

## 2021-10-01 DIAGNOSIS — I5032 Chronic diastolic (congestive) heart failure: Secondary | ICD-10-CM

## 2021-10-01 DIAGNOSIS — E1142 Type 2 diabetes mellitus with diabetic polyneuropathy: Secondary | ICD-10-CM

## 2021-10-01 NOTE — Telephone Encounter (Signed)
Dr. Juluis Rainier: ? ?Heather Nurse/[employer]- ? ?Stated that Pt has not been filling her meds this year. Nurse also stated that she has not  been able to reach pt. Nurse is more concern that the pt has not been taking her Carvedilol and plavix. Per nurse is all over with her atorvastatin as far as taking them. ? ?However, when I looked in the pt's chart, I did advice nurse that pt has gotten her meds refilled recently (carvedilol/atorvastatin). As for pt's plavix, it was last refilled on 07/22/21 w/#90 and 3 refills, which means that pt should have meds left and don't need refill.  ? ?Nurse voiced understanding.  ?

## 2021-10-01 NOTE — Chronic Care Management (AMB) (Signed)
?  Chronic Care Management ?Note ? ?10/01/2021 ?Name: Mareli Antunes MRN: [id number] DOB: 1950-11-26 ? ?Lavora Brisbon is a 71 y.o. year old female who is a primary care patient of [name], MD and is actively engaged with the care management team. I reached out to [name] by phone today to assist with re-scheduling a follow up visit with the RN Case Manager ? ?Follow up plan: ?A third unsuccessful telephone outreach attempt made. A HIPAA compliant phone message was left for the patient providing contact information and requesting a return call. Unable to make contact on outreach attempts x 3. PCP Dr. Dennard Schaumann notified via routed documentation in medical record. We have been unable to make contact with the patient for follow up. The care management team is available to follow up with the patient after provider conversation with the patient regarding recommendation for care management engagement and subsequent re-referral to the care management team.  ? ?Laverda Sorenson  ?Care Guide, Embedded Care Coordination ?[facility]  Care Management  ?Direct Dial: [phone] ? ?

## 2021-10-01 NOTE — Chronic Care Management (AMB) (Signed)
? ?  10/01/2021 ? ?Susan [name] ?[birth year] ?[id number] ? ? ?Message received 10/01/21 from care guide, 3 unsuccessful outreach attempts to reschedule patient telephone assessment.  Care plan updated and resolved.  Case closed. ? ?Jacqlyn Larsen RNC, BSN ?RN Case Manager ?[place] ?[phone] ? ?

## 2021-10-01 NOTE — Progress Notes (Signed)
Office Visit Note   Patient: Susan [name]           Date of Birth: 01-09-1951           MRN: [id number] Visit Date: 09/30/2021              Requested by: Donita Brooks, MD [address] [place],  [place] [address] PCP: Donita Brooks, MD  Chief Complaint  Patient presents with   Left Foot - Follow-up      HPI: Patient is a 71 year old woman who is seen for evaluation for her left foot onychomycotic nails and transmetatarsal amputation of the right.  Patient also has venous insufficiency bilateral lower extremities using the 2 extra-large compression socks.  Assessment & Plan: Visit Diagnoses:  1. Onychomycosis   2. History of transmetatarsal amputation of right foot (HCC)   3. Idiopathic chronic venous hypertension of both lower extremities with ulcer and inflammation (HCC)     Plan: Patient will continue with compression socks reevaluate in 3 months.  No ulcers on the left foot.  Follow-Up Instructions: Return in about 3 months (around 12/31/2021).   Ortho Exam  Patient is alert, oriented, no adenopathy, well-dressed, normal affect, normal respiratory effort. Examination patient has palpable pulses on the left.  She has thickened discolored onychomycotic nails which she is unable to safely trim on her own.  There were no ulcers no ischemic changes she does have venous stasis swelling in both legs but no open ulcers.  After informed consent the nails were trimmed x5 on the left foot.  No ulcers on the right foot.  Most recent hemoglobin A1c is 12.2.  Imaging: No results found. No images are attached to the encounter.  Labs: Lab Results  Component Value Date   HGBA1C 12.2 (H) 03/05/2021   HGBA1C 10.3 (H) 08/26/2020   HGBA1C 11.0 (H) 08/14/2020   CRP 0.7 08/22/2020   CRP 1.5 (H) 03/13/2020   CRP 1.9 (H) 03/12/2020   LABURIC 4.7 08/09/2020   LABURIC 9.1 (H) 10/12/2018   LABURIC 9.0 (H) 03/06/2018   REPTSTATUS 03/09/2021 FINAL 03/06/2021   CULT >=100,000  COLONIES/mL ENTEROBACTER CLOACAE (A) 03/06/2021   LABORGA ENTEROBACTER CLOACAE (A) 03/06/2021     Lab Results  Component Value Date   ALBUMIN 2.8 (L) 08/22/2020   ALBUMIN 3.2 (L) 08/21/2020   ALBUMIN 2.3 (L) 05/28/2020    Lab Results  Component Value Date   MG 2.6 (H) 03/07/2021   MG 2.2 08/22/2020   MG 1.9 08/19/2020   No results found for: VD25OH  No results found for: PREALBUMIN    Latest Ref Rng & Units 07/31/2021    4:54 PM 07/11/2021    2:29 PM 03/15/2021    2:30 PM  CBC EXTENDED  WBC 3.8 - 10.8 Thousand/uL 8.8   10.8   9.0    RBC 3.80 - 5.10 Million/uL 4.63   4.83   4.52    Hemoglobin 11.7 - 15.5 g/dL 62.1   30.8   65.7    HCT 35.0 - 45.0 % 37.5   38.9   38.7    Platelets 140 - 400 Thousand/uL 215   206   182    NEUT# 1,500 - 7,800 cells/uL 6,477   8,392   6,984    Lymph# 850 - 3,900 cells/uL 845   950   864       There is no height or weight on file to calculate BMI.  Orders:  No  orders of the defined types were placed in this encounter.  No orders of the defined types were placed in this encounter.    Procedures: No procedures performed  Clinical Data: No additional findings.  ROS:  All other systems negative, except as noted in the HPI. Review of Systems  Objective: Vital Signs: There were no vitals taken for this visit.  Specialty Comments:  No specialty comments available.  PMFS History: Patient Active Problem List   Diagnosis Date Noted   NSTEMI (non-ST elevated myocardial infarction) (HCC) 03/05/2021   Acute renal failure superimposed on stage 4 chronic kidney disease (HCC) 08/22/2020   Hypoalbuminemia 05/25/2020   GERD (gastroesophageal reflux disease) 05/25/2020   Pressure injury of skin 05/17/2020   Type 2 diabetes mellitus with diabetic polyneuropathy, with long-term current use of insulin (HCC) 03/07/2020   Obesity, Class III, BMI 40-49.9 (morbid obesity) (HCC) 03/07/2020   Common bile duct (CBD) obstruction 05/28/2019   Benign  neoplasm of ascending colon    Benign neoplasm of transverse colon    Benign neoplasm of descending colon    Benign neoplasm of sigmoid colon    Gastric polyps    Hyperkalemia 03/11/2019   Prolonged QT interval 03/11/2019   Onychomycosis 06/21/2018   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)    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 (HCC)    PVD (peripheral vascular disease) (HCC) 10/26/2017   E-coli UTI 07/27/2017   Hypothyroidism 07/27/2017   PAH (pulmonary artery hypertension) (HCC)    Impaired ambulation 07/19/2017   Leg cramps 02/27/2017   Peripheral edema 01/12/2017   Diabetic neuropathy (HCC) 11/12/2016   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 mellitus (HCC) 10/24/2015   Anemia 10/03/2015   Generalized anxiety disorder 10/03/2015   Insomnia 10/03/2015   Hyperglycemia due to diabetes mellitus (HCC) 06/07/2015   Chronic diastolic CHF (congestive heart failure) (HCC) 06/07/2015   Non compliance with medical treatment 04/17/2014   Rotator cuff tear 03/14/2014   Class 3 obesity (HCC) 09/23/2013   Hypotension 12/25/2012   Urinary incontinence    MDD (major depressive disorder) 11/12/2010   RBBB (right bundle branch block)    Coronary artery disease    Hyperlipemia 01/22/2009   Essential hypertension 01/22/2009   Past Medical History:  Diagnosis Date   Acute MI (HCC) 1999; 2007   Anemia    hx   Anginal pain (HCC)    Anxiety    ARF (acute renal failure) (HCC) 06/2017   [facility]   Arthritis    "generalized" (03/15/2014)   CAD (coronary artery disease)    MI in 2000 - MI  2007 - treated bare metal stent (no nuclear since then as 9/11)   Carotid artery disease (HCC)    CHF (congestive heart failure) (HCC)    Chronic diastolic heart failure (HCC)    a) ECHO (08/2013) EF 55-60% and RV function nl b) RHC (08/2013) RA 4, RV 30/5/7, PA 25/10 (16), PCWP 7, Fick CO/CI 6.3/2.7, PVR 1.5 WU, PA 61 and 66%   Daily headache    "~ every other day;  since I fell in June" (03/15/2014)   Depression    Dyslipidemia    Dyspnea    Exertional shortness of breath    HTN (hypertension)    Hypothyroidism    Neuropathy    Obesity    Osteoarthritis    Peripheral neuropathy    PONV (postoperative nausea and vomiting)    RBBB (right bundle  branch block)    Old   Stroke (HCC)    mini strokes   Syncope    likely due to low blood sugar   Tachycardia    Sinus tachycardia   Type II diabetes mellitus (HCC)    Type II   Urinary incontinence    Venous insufficiency     Family History  Problem Relation Age of Onset   Heart attack Mother 45    Past Surgical History:  Procedure Laterality Date   ABDOMINAL HYSTERECTOMY  1980's   AMPUTATION Right 02/24/2018   Procedure: RIGHT FOOT GREAT TOE AND 2ND TOE AMPUTATION;  Surgeon: Nadara Mustard, MD;  Location: [facility] OR;  Service: Orthopedics;  Laterality: Right;   AMPUTATION Right 04/30/2018   Procedure: RIGHT TRANSMETATARSAL AMPUTATION;  Surgeon: Nadara Mustard, MD;  Location: [facility] OR;  Service: Orthopedics;  Laterality: Right;   BIOPSY  05/27/2020   Procedure: BIOPSY;  Surgeon: Lanelle Bal, DO;  Location: [facility] ENDO SUITE;  Service: Endoscopy;;   CATARACT EXTRACTION, BILATERAL Bilateral ?2013   COLONOSCOPY W/ POLYPECTOMY     COLONOSCOPY WITH PROPOFOL N/A 03/13/2019   Procedure: COLONOSCOPY WITH PROPOFOL;  Surgeon: Beverley Fiedler, MD;  Location: [facility] ENDOSCOPY;  Service: Gastroenterology;  Laterality: N/A;   CORONARY ANGIOPLASTY WITH STENT PLACEMENT  1999; 2007   "1 + 1"   ESOPHAGOGASTRODUODENOSCOPY (EGD) WITH PROPOFOL N/A 03/13/2019   Procedure: ESOPHAGOGASTRODUODENOSCOPY (EGD) WITH PROPOFOL;  Surgeon: Beverley Fiedler, MD;  Location: [facility] ENDOSCOPY;  Service: Gastroenterology;  Laterality: N/A;   ESOPHAGOGASTRODUODENOSCOPY (EGD) WITH PROPOFOL N/A 05/27/2020   Procedure: ESOPHAGOGASTRODUODENOSCOPY (EGD) WITH PROPOFOL;  Surgeon: Lanelle Bal, DO;  Location: [facility] ENDO SUITE;  Service: Endoscopy;  Laterality:  N/A;   EYE SURGERY Bilateral    lazer   HEMOSTASIS CLIP PLACEMENT  03/13/2019   Procedure: HEMOSTASIS CLIP PLACEMENT;  Surgeon: Beverley Fiedler, MD;  Location: [facility] ENDOSCOPY;  Service: Gastroenterology;;   KNEE ARTHROSCOPY Left 10/25/2006   POLYPECTOMY  03/13/2019   Procedure: POLYPECTOMY;  Surgeon: Beverley Fiedler, MD;  Location[facility] ENDOSCOPY;  Service: Gastroenterology;;   RIGHT HEART CATH N/A 07/24/2017   Procedure: RIGHT HEART CATH;  Surgeon: Dolores Patty, MD;  Location: [facility] INVASIVE CV LAB;  Service: Cardiovascular;  Laterality: N/A;   RIGHT HEART CATHETERIZATION N/A 09/22/2013   Procedure: RIGHT HEART CATH;  Surgeon: Dolores Patty, MD;  Location: [facility] CATH LAB;  Service: Cardiovascular;  Laterality: N/A;   SHOULDER ARTHROSCOPY WITH OPEN ROTATOR CUFF REPAIR Right 03/14/2014   Procedure: RIGHT SHOULDER ARTHROSCOPY WITH BICEPS RELEASE, OPEN SUBSCAPULA REPAIR, OPEN SUPRASPINATUS REPAIR.;  Surgeon: Cammy Copa, MD;  Location: [facility] OR;  Service: Orthopedics;  Laterality: Right;   TOE AMPUTATION Right 02/24/2018   GREAT TOE AND 2ND TOE AMPUTATION   TUBAL LIGATION  1970's   Social History   Occupational History    Employer: UNEMPLOYED  Tobacco Use   Smoking status: Former    Packs/day: 3.00    Years: 32.00    Pack years: 96.00    Types: Cigarettes    Quit date: 10/24/1997    Years since quitting: 23.9   Smokeless tobacco: Never  Vaping Use   Vaping Use: Never used  Substance and Sexual Activity   Alcohol use: Not Currently    Comment: "might have 2-3 daiquiris in the summer"   Drug use: No   Sexual activity: Not Currently

## 2021-10-03 DIAGNOSIS — N2581 Secondary hyperparathyroidism of renal origin: Secondary | ICD-10-CM | POA: Diagnosis not present

## 2021-10-03 DIAGNOSIS — E1122 Type 2 diabetes mellitus with diabetic chronic kidney disease: Secondary | ICD-10-CM | POA: Diagnosis not present

## 2021-10-03 DIAGNOSIS — I129 Hypertensive chronic kidney disease with stage 1 through stage 4 chronic kidney disease, or unspecified chronic kidney disease: Secondary | ICD-10-CM | POA: Diagnosis not present

## 2021-10-03 DIAGNOSIS — D631 Anemia in chronic kidney disease: Secondary | ICD-10-CM | POA: Diagnosis not present

## 2021-10-03 DIAGNOSIS — N189 Chronic kidney disease, unspecified: Secondary | ICD-10-CM | POA: Diagnosis not present

## 2021-10-03 DIAGNOSIS — N184 Chronic kidney disease, stage 4 (severe): Secondary | ICD-10-CM | POA: Diagnosis not present

## 2021-10-15 DIAGNOSIS — I5032 Chronic diastolic (congestive) heart failure: Secondary | ICD-10-CM | POA: Diagnosis not present

## 2021-10-18 ENCOUNTER — Telehealth (HOSPITAL_COMMUNITY): Payer: Self-pay | Admitting: *Deleted

## 2021-10-18 NOTE — Telephone Encounter (Signed)
P left vm requesting a call about an appointment. I called pt back three times each time I received a busy tone [phone]

## 2021-10-21 ENCOUNTER — Other Ambulatory Visit: Payer: Self-pay | Admitting: Family Medicine

## 2021-10-22 NOTE — Telephone Encounter (Signed)
Requested Prescriptions  Pending Prescriptions Disp Refills  . TRINTELLIX 10 MG TABS tablet [Pharmacy Med Name: TRINTELLIX 10 MG TABLET] 30 tablet 5    Sig: TAKE 1 TABLET BY MOUTH EVERY DAY     Psychiatry: Antidepressants - Serotonin Modulator Passed - 10/21/2021  1:56 AM      Passed - Completed PHQ-2 or PHQ-9 in the last 360 days      Passed - Valid encounter within last 6 months    Recent Outpatient Visits          2 months ago Cellulitis of lower extremity, unspecified laterality   [place] [name], MD   2 months ago Diarrhea, unspecified type   [place] [name], MD   2 months ago [place] [name], MD   3 months ago Stage 4 chronic kidney disease ([place])   [place] [name], MD   3 months ago Chronic diastolic CHF (congestive heart failure) ([place])   [place] [name], [name], MD      Future Appointments            In 2 months Newt Minion, MD [facility]

## 2021-10-23 DIAGNOSIS — Z794 Long term (current) use of insulin: Secondary | ICD-10-CM

## 2021-10-23 DIAGNOSIS — E1159 Type 2 diabetes mellitus with other circulatory complications: Secondary | ICD-10-CM

## 2021-10-23 DIAGNOSIS — I509 Heart failure, unspecified: Secondary | ICD-10-CM

## 2021-10-30 ENCOUNTER — Other Ambulatory Visit (HOSPITAL_COMMUNITY): Payer: Self-pay | Admitting: *Deleted

## 2021-10-31 ENCOUNTER — Encounter (HOSPITAL_COMMUNITY)
Admission: RE | Admit: 2021-10-31 | Discharge: 2021-10-31 | Disposition: A | Discharge status: Home / Self Care | Payer: HMO | Source: Ambulatory Visit | Attending: Nephrology | Admitting: Nephrology

## 2021-10-31 DIAGNOSIS — N189 Chronic kidney disease, unspecified: Secondary | ICD-10-CM | POA: Insufficient documentation

## 2021-10-31 DIAGNOSIS — D631 Anemia in chronic kidney disease: Secondary | ICD-10-CM | POA: Insufficient documentation

## 2021-10-31 MED ORDER — SODIUM CHLORIDE 0.9 % IV SOLN
510.0000 mg | INTRAVENOUS | Status: DC
  Administered 2021-10-31: 510 mg via INTRAVENOUS
  Filled 2021-10-31: qty 510

## 2021-11-02 ENCOUNTER — Other Ambulatory Visit (HOSPITAL_COMMUNITY): Payer: Self-pay | Admitting: Cardiology

## 2021-11-08 ENCOUNTER — Encounter (HOSPITAL_COMMUNITY)
Admission: RE | Admit: 2021-11-08 | Discharge: 2021-11-08 | Disposition: A | Discharge status: Home / Self Care | Payer: HMO | Source: Ambulatory Visit | Attending: Nephrology | Admitting: Nephrology

## 2021-11-08 DIAGNOSIS — N189 Chronic kidney disease, unspecified: Secondary | ICD-10-CM | POA: Diagnosis not present

## 2021-11-08 MED ORDER — SODIUM CHLORIDE 0.9 % IV SOLN
510.0000 mg | INTRAVENOUS | Status: AC
  Administered 2021-11-08: 510 mg via INTRAVENOUS
  Filled 2021-11-08: qty 17

## 2021-11-13 ENCOUNTER — Telehealth (HOSPITAL_COMMUNITY): Payer: Self-pay | Admitting: Vascular Surgery

## 2021-11-13 NOTE — Telephone Encounter (Signed)
Returned pt call to make f/u appt w/ app/np

## 2021-11-15 ENCOUNTER — Telehealth: Payer: Self-pay | Admitting: Pharmacist

## 2021-11-15 DIAGNOSIS — I5032 Chronic diastolic (congestive) heart failure: Secondary | ICD-10-CM | POA: Diagnosis not present

## 2021-12-03 ENCOUNTER — Telehealth (HOSPITAL_COMMUNITY): Payer: Self-pay | Admitting: *Deleted

## 2021-12-03 NOTE — Telephone Encounter (Signed)
Tiffany nurse case manager with [facility] left vm requesting a return call at [phone]. I called back and left vm for return call.

## 2021-12-04 ENCOUNTER — Telehealth: Payer: Self-pay

## 2021-12-04 DIAGNOSIS — N189 Chronic kidney disease, unspecified: Secondary | ICD-10-CM | POA: Diagnosis not present

## 2021-12-04 DIAGNOSIS — D631 Anemia in chronic kidney disease: Secondary | ICD-10-CM | POA: Diagnosis not present

## 2021-12-04 DIAGNOSIS — E1122 Type 2 diabetes mellitus with diabetic chronic kidney disease: Secondary | ICD-10-CM | POA: Diagnosis not present

## 2021-12-04 DIAGNOSIS — N184 Chronic kidney disease, stage 4 (severe): Secondary | ICD-10-CM | POA: Diagnosis not present

## 2021-12-04 DIAGNOSIS — I129 Hypertensive chronic kidney disease with stage 1 through stage 4 chronic kidney disease, or unspecified chronic kidney disease: Secondary | ICD-10-CM | POA: Diagnosis not present

## 2021-12-04 NOTE — Telephone Encounter (Signed)
Healthcare management Pt has c/o drainage on her her leg/been bleeding   Pt fell on 7/10, decline to Ed, a1c?

## 2021-12-04 NOTE — Telephone Encounter (Signed)
Tiffany w/[employer] team, called stated that she spoke with pt and pt has c/o about her legs been

## 2021-12-06 NOTE — Progress Notes (Deleted)
Chronic Care Management Pharmacy Note  05/07/2021 Name:  Belmira Daley MRN:  [id number] DOB:  [birth year]  Summary: Initial visit with PharmD.  DM severely uncontrolled.  She reports she is just giving herself bolus through omnipod.  Unclear if there is a basal rate.  She also cannot afford Trintellix.  Recommendations/Changes made from today's visit: Substantial counseling on DM diet - patient to work on decreasing fast food and gatorades, soda  Plan: FU 3 months   Subjective: Quamesha Mullet is an 71 y.o. year old female who is a primary patient of [name], [name], MD.  The CCM team was consulted for assistance with disease management and care coordination needs.    Engaged with patient by telephone for initial visit in response to provider referral for pharmacy case management and/or care coordination services.   Consent to Services:  The patient was given the following information about Chronic Care Management services today, agreed to services, and gave verbal consent: 1. CCM service includes personalized support from designated clinical staff supervised by the primary care provider, including individualized plan of care and coordination with other care providers 2. 24/7 contact phone numbers for assistance for urgent and routine care needs. 3. Service will only be billed when office clinical staff spend 20 minutes or more in a month to coordinate care. 4. Only one practitioner may furnish and bill the service in a calendar month. 5.The patient may stop CCM services at any time (effective at the end of the month) by phone call to the office staff. 6. The patient will be responsible for cost sharing (co-pay) of up to 20% of the service fee (after annual deductible is met). Patient agreed to services and consent obtained.  Patient Care Team: [name], MD as PCP - General (Family Medicine) Bensimhon, Shaune Pascal, MD as PCP - Cardiology (Cardiology) Jacelyn Pi, MD  (Endocrinology) Hollie Salk Nevin Bloodgood, PA-C (Neurology) Newt Minion, MD as Consulting Physician (Orthopedic Surgery) Jorge Ny, LCSW as Social Worker (Licensed Clinical Social Worker) Kassie Mends, RN as [place] [place] as Pharmacist (Pharmacist)  Recent office visits:    04/04/21 [name], MD (PCP) - Family Medicine - Stage 4 CKD -Labs were ordered. vortioxetine HBr (TRINTELLIX) 10 MG TABS tablet take 1 tablet (10 mg total) by mouth daily prescribed. Follow up as scheduled.   03/15/21 [name], MD (PCP) - Family Medicine - Stage 4 CKD - Labs were ordered. Referral for Home health placed. Pregabalin (LYRICA) 50 MG capsule take 1 capsule (50 mg total) by mouth 2 (two) times daily ordered.  Discontinue Prozac and switch to Trintellix 10 mg a day and recheck in 3 weeks.   03/05/21 [name], MD (PCP) - Family Medicine - Dyspnea - Labs were ordered. EKG performed in office. shortness of breath is related to cardiac ischemia.  Patient refuses to go to the hospital [place].  After much discussion, I was able to convince the patient to go to the hospital but she adamantly refuses to allow me to call an ambulance.  I do not feel that the patient is in a life-threatening emergency at this moment.  Therefore I will compromise with the patient and allow her to drive herself to the local emergency room for further evaluation.    12/17/20 [name], MD (PCP) - Family Medicine - Blood blister - Recommended continue dressing changes for the remainder of this week and then resume normal behavior.  She  can reduce her torsemide back to 5 tablets in the morning and 4 tablets in the evening as the swelling is back to her baseline. Follow up as scheduled.    12/13/20 [name], MD (PCP) - Family Medicine - Blood blister  - aspirated the contents of the blood blister with a 18-gauge needle and a 20 cc syringe.  I was able  to completely empty the blood blister.  I then coated the wound with Silvadene, nonadherent gauze, and then wrapped the foot with Coban.  Recommended daily dressing changes.  Recheck next week.  Increase torsemide 100 mg twice daily until seen on Monday due to increased weight and edema   11/15/20 [name], MD (PCP) - Family Medicine - Chronic Diastolic CHF - Labs were ordered. cephALEXin (KEFLEX) 250 MG capsule take 1 capsule (250 mg total) by mouth daily prescribed. anesthetized the seborrheic keratosis on the left side of her neck and removed it using sterile technique with a razor blade. Continue the patient on torsemide 100 mg in the morning and 80 mg in the evening  We discussed the options and the patient would like to try low-dose Keflex 250 mg daily despite that it may have caused diarrhea in the past for prevention of other urinary tract infections to see if this helps.  If she develops diarrhea she will discontinue it immediately.     Recent consult visits:    04/09/21 Meridee Score, MD - Orthopedic surgery - Onychomycosis - No medication changes. Follow up in 3 months.    01/07/21 Meridee Score, MD - Orthopedic surgery- Idiopathic chronic venous hypertension of both lower extremities - Recommended patient wear the extra-large compression stocking she is currently wearing the double extra-large.  The socks were applied for her.  For her legs recommended exercise elevation and compression.Follow up in 3 months.    11/12/20  Meridee Score, MD - Orthopedic surgery- Idiopathic chronic venous hypertension of both lower extremities - No medication changes. Follow up in 3 months.      Hospital visits: 02/05/2021   Medication Reconciliation was completed by comparing discharge summary, patient's EMR and Pharmacy list, and upon discussion with patient.   Admitted to the hospital on 02/05/21 due to Diabetic ketoacidosis. Discharge date was 02/09/21. Discharged from [facility]?Medications Started at [facility] Discharge:?? Azithromycin (ZITHROMAX) 500 MG tablet and loperamide (IMODIUM) 2 MG capsule prescribed.   Medication Changes at Hospital Discharge: Changed Torsemide to 92m Take 2 tablets (40 mg total) by mouth 2 (two) times daily.   Medications Discontinued at Hospital Discharge: Metolazone 2.5 and Potassium Chloride 20 meq   Medications that remain the same after Hospital Discharge:??  All other medications will remain the same.     Hospital visits: 03/05/21   Medication Reconciliation was completed by comparing discharge summary, patient's EMR and Pharmacy list, and upon discussion with patient.   Admitted to the hospital on 03/05/21 due to NSTEMI. Discharge date was 03/10/21[facility] atorvastatin (LIPITOR) 80 MG tablet, isosorbide mononitrate (IMDUR) 30 MG 24 hr tablet, linagliptin (TRADJENTA) 5 MG TABS tablet, hydrALAZINE (APRESOLINE) 10 MG tablet, fosfomycin (MONUROL) 3 g PACK were prescribed.   Medication Changes at Hospital Discharge: ferrous sulfate 325 (65 FE) MG tablet and magnesium oxide (MAG-OX) 400 MG tablet were changed.   Medications Discontinued at Hospital Discharge: Lyrica, Trazodone, and Keflex were discontinued.    Medications that remain the same  after Hospital Discharge:??  All other medications will remain the same.       Objective:  Lab Results  Component Value Date   CREATININE 4.32 (H) 05/06/2021   BUN 103 (H) 05/06/2021   GFR 41.58 (L) 08/17/2013   GFRNONAA 10 (L) 05/06/2021   GFRAA 13 (L) 11/15/2020   NA 134 (L) 05/06/2021   K 3.4 (L) 05/06/2021   CALCIUM 8.4 (L) 05/06/2021   CO2 26 05/06/2021   GLUCOSE 419 (H) 05/06/2021    Lab Results  Component Value Date/Time   HGBA1C 12.2 (H) 03/05/2021 05:34 PM   HGBA1C 10.3 (H) 08/26/2020 08:01 AM   GFR 41.58 (L) 08/17/2013 02:46 PM   GFR 61.93 01/17/2011 02:36 PM   MICROALBUR  287.8 06/29/2020 01:58 PM   MICROALBUR 65.6 11/14/2016 09:25 AM    Last diabetic Eye exam:  Lab Results  Component Value Date/Time   HMDIABEYEEXA Retinopathy (A) 09/23/2017 12:00 AM    Last diabetic Foot exam: No results found for: HMDIABFOOTEX   Lab Results  Component Value Date   CHOL 172 03/06/2021   HDL 51 03/06/2021   LDLCALC 91 03/06/2021   TRIG 150 (H) 03/06/2021   CHOLHDL 3.4 03/06/2021    Hepatic Function Latest Ref Rng & Units 03/15/2021 08/22/2020 08/21/2020  Total Protein 6.1 - 8.1 g/dL 6.3 5.7(L) 6.3(L)  Albumin 3.5 - 5.0 g/dL - 2.8(L) 3.2(L)  AST 10 - 35 U/L 23 39 23  ALT 6 - 29 U/L _0 Alk Phosphatase 38 - 126 U/L - 189(H) 207(H)  Total Bilirubin 0.2 - 1.2 mg/dL 0.4 1.2 0.5  Bilirubin, Direct 0.0 - 0.2 mg/dL - - -    Lab Results  Component Value Date/Time   TSH 3.69 07/05/2020 12:47 PM   TSH 3.591 05/17/2020 04:59 PM   TSH 0.964 02/14/2018 11:18 AM   TSH 1.374 12/25/2012 06:00 PM    CBC Latest Ref Rng & Units 03/15/2021 03/09/2021 03/07/2021  WBC 3.8 - 10.8 Thousand/uL 9.0 6.5 7.5  Hemoglobin 11.7 - 15.5 g/dL 12.2 10.6(L) 11.4(L)  Hematocrit 35.0 - 45.0 % 38.7 34.2(L) 36.1  Platelets 140 - 400 Thousand/uL 182 159 158    No results found for: VD25OH  Clinical ASCVD: No  The ASCVD Risk score (Arnett DK, et al., 2019) failed to calculate for the following reasons:   The patient has a prior MI or stroke diagnosis    Depression screen [facility] 2/9 03/15/2021 02/04/2021 02/04/2021  Decreased Interest 3 0 0  Down, Depressed, Hopeless 3 0 0  PHQ - 2 Score 6 0 0  Altered sleeping 3 - -  Tired, decreased energy 3 - -  Change in appetite 3 - -  Feeling bad or failure about yourself  3 - -  Trouble concentrating 3 - -  Moving slowly or fidgety/restless 3 - -  Suicidal thoughts 1 - -  PHQ-9 Score 25 - -  Difficult doing work/chores Extremely dIfficult - -  Some recent data might be hidden    Social History   Tobacco Use  Smoking Status Former    Packs/day: 3.00   Years: 32.00   Pack years: 96.00   Types: Cigarettes   Quit date: 10/24/1997   Years since quitting: 23.5  Smokeless Tobacco Never   BP Readings from Last 3 Encounters:  05/06/21 108/70  04/04/21 128/68  04/02/21 118/70   Pulse Readings from Last 3 Encounters:  05/06/21 75  04/04/21 80  04/02/21 82   Wt Readings from  Last 3 Encounters:  05/06/21 265 lb 12.8 oz (120.6 kg)  04/04/21 273 lb (123.8 kg)  04/02/21 275 lb (124.7 kg)   BMI Readings from Last 3 Encounters:  05/06/21 44.23 kg/m  04/04/21 45.43 kg/m  04/02/21 45.76 kg/m    Assessment/Interventions: Review of patient past medical history, allergies, medications, health status, including review of consultants reports, laboratory and other test data, was performed as part of comprehensive evaluation and provision of chronic care management services.   SDOH:  (Social Determinants of Health) assessments and interventions performed: Yes  Financial Resource Strain: Low Risk  (08/16/2021)   Overall Financial Resource Strain (CARDIA)    Difficulty of Paying Living Expenses: Not hard at all    SDOH Screenings   Alcohol Screen: Low Risk    Last Alcohol Screening Score (AUDIT): 0  Depression (PHQ2-9): Medium Risk   PHQ-2 Score: 25  Financial Resource Strain: Low Risk    Difficulty of Paying Living Expenses: Not very hard  Food Insecurity: No Food Insecurity   Worried About Charity fundraiser in the Last Year: Never true   Ran Out of Food in the Last Year: Never true  Housing: Low Risk    Last Housing Risk Score: 0  Physical Activity: Not on file  Social Connections: Not on file  Stress: Not on file  Tobacco Use: Medium Risk   Smoking Tobacco Use: Former   Smokeless Tobacco Use: Never   Passive Exposure: Not on file  Transportation Needs: No Transportation Needs   Lack of Transportation (Medical): No   Lack of Transportation (Non-Medical): No    CCM Care Plan  Allergies  Allergen Reactions    Drug Ingredient [Cephalexin] Diarrhea    Pt reports heavy diarrhea with use of keflex   Codeine Nausea And Vomiting and Other (See Comments)    Medications Reviewed Today     Reviewed by Jerl Mina, RN (Registered Nurse) on 05/06/21 at 1415  Med List Status: <None>   Medication Order Taking? Sig Documenting Provider Last Dose Status Informant  acetaminophen (TYLENOL) 500 MG tablet [id number] Yes Take 1,000 mg by mouth every 6 (six) hours as needed for moderate pain or headache. [provider] Taking Active Self  albuterol (VENTOLIN HFA) 108 (90 Base) MCG/ACT inhaler [id number] Yes TAKE 2 PUFFS BY MOUTH EVERY 6 HOURS AS NEEDED FOR WHEEZE OR SHORTNESS OF BREATH [place], [name], MD Taking Active   allopurinol (ZYLOPRIM) 100 MG tablet [id number] Yes Take 0.5 tablets (50 mg total) by mouth daily. Florencia Reasons, MD Taking Active   Ascorbic Acid (VITAMIN C) 1000 MG tablet [id number] Yes Take 1,000 mg by mouth daily. [provider] Taking Active Self  aspirin EC 81 MG tablet [id number] Yes Take 1 tablet (81 mg total) by mouth daily with breakfast. Roxan Hockey, MD Taking Active Self  atorvastatin (LIPITOR) 80 MG tablet [id number] Yes Take 1 tablet (80 mg total) by mouth daily. [place], [place], FNP Taking Active   carvedilol (COREG) 12.5 MG tablet [id number] Yes TAKE 1 TABLET (12.5 MG TOTAL) BY MOUTH 2 (TWO) TIMES DAILY WITH A MEAL. [name], MD Taking Active   cholecalciferol (VITAMIN D) 1000 units tablet [id number] Yes Take 1,000 Units by mouth daily with supper.  [provider] Taking Active Self  clopidogrel (PLAVIX) 75 MG tablet [id number] Yes TAKE 1 TABLET BY MOUTH DAILY WITH BREAKFAST. Darrick Grinder D, NP Taking Active   Cranberry 250 MG TABS [id number] Yes Take 500 mg by mouth daily. [provider] Taking Active Self  ferrous sulfate 325 (65 FE) MG tablet [id number] Yes Take 1 tablet (325 mg total) by mouth daily with breakfast. Reported on 08/21/2015  Florencia Reasons, MD Taking Active   guaiFENesin-dextromethorphan [facility] DM) 100-10 MG/5ML syrup [id number] Yes Take 10 mLs by mouth every 4 (four) hours as needed for cough. [place] Eric J, DO Taking Active Self  hydrALAZINE (APRESOLINE) 10 MG tablet [id number] Yes 1 tablet with food [provider] Taking Active   hydrocortisone cream 0.5 % [id number] Yes Apply 1 application topically 2 (two) times daily. Florencia Reasons, MD Taking Active   Insulin Disposable Pump (OMNIPOD DASH 5 PACK PODS) MISC [id number] Yes Inject into the skin See admin instructions. Use continuously with Novolin R - change every 72 hours. [provider] Taking Active Self           Med Note Isac Caddy, LENA B   Thu Apr 28, 2019  1:41 PM)    insulin NPH Human (HUMULIN N,NOVOLIN N) 100 UNIT/ML injection [id number] Yes Inject 15 Units into the skin See admin instructions. Inject 15 units subcutaneously in the morning if CBG >300; [provider] Taking Active Self           Med Note Isac Caddy, LENA B   Thu Apr 28, 2019  1:41 PM)    insulin regular (NOVOLIN R) 100 units/mL injection [id number] Yes Inject into the skin See admin instructions. Manually add bolus to continuous dose via OmniPod 3 times daily per sliding scale (CBG 80-150 7 units, 151-200 9 units, 201-250 12 units, 251-300 14 units, 301- 400 17 units) [provider] Taking Active Self           Med Note Dimitri Ped, AMANDA L   Tue Jul 24, 2020  1:31 PM)    isosorbide mononitrate (IMDUR) 30 MG 24 hr tablet [id number] Yes Take 0.5 tablets (15 mg total) by mouth daily. Florencia Reasons, MD Taking Active   KLOR-CON M20 20 MEQ tablet [id number] Yes TAKE 1 TABLET (20 MEQ TOTAL) BY MOUTH ONCE A WEEK. TAKE 20 MEQ WITH DOSE OF METOLAZONE 2.5 MG Bensimhon, Shaune Pascal, MD Taking Active   levothyroxine (SYNTHROID, LEVOTHROID) 50 MCG tablet [id number] Yes Take 1 tablet (50 mcg total) by mouth daily before breakfast. [name], PA-C Taking Active Self  linagliptin  (TRADJENTA) 5 MG TABS tablet [id number] Yes 1 tablet [provider] Taking Active   magnesium oxide (MAG-OX) 400 (240 Mg) MG tablet [id number] Yes Take 1 tablet by mouth daily. [provider] Taking Active   metolazone (ZAROXOLYN) 2.5 MG tablet [id number] Yes Take 1 tablet (2.5 mg total) by mouth daily. [place], Maricela Bo, FNP Taking Active   Multiple Vitamins-Minerals (AIRBORNE GUMMIES PO) [id number] Yes Take 2 tablets by mouth every evening. [provider] Taking Active Self  Multiple Vitamins-Minerals (AIRBORNE GUMMIES PO) [id number] Yes Take 2 tablets by mouth daily. [provider] Taking Active   Multiple Vitamins-Minerals (CENTRUM SILVER 50+WOMEN) TABS [id number] Yes Take 1 tablet by mouth every evening. [provider] Taking Active Self  nitroGLYCERIN (NITROSTAT) 0.4 MG SL tablet [id number] Yes PLACE 1 TABLET (0.4 MG TOTAL) UNDER THE TONGUE EVERY 5 (FIVE) MINUTES AS NEEDED FOR CHEST PAIN. Larey Dresser, MD Taking Active Self  Roma Schanz test strip [id number] Yes 3 (three) times daily. [provider] Taking Active Self  polyethylene glycol (MIRALAX / GLYCOLAX) 17 g packet [id number] Yes 1 packet mixed with 8 ounces of fluid [provider] Taking Active  pregabalin (LYRICA) 150 MG capsule [id number] Yes Take 150 mg by mouth 2 (two) times daily. [provider] Taking Active   sitaGLIPtin (JANUVIA) 25 MG tablet [id number] Yes Take 1 tablet (25 mg total) by mouth daily. [name], MD Taking Active   torsemide (DEMADEX) 20 MG tablet [id number] Yes Take 80 mg by mouth 2 (two) times daily. [provider] Taking Active Self  ursodiol (ACTIGALL) 500 MG tablet [id number] Yes Take 500 mg by mouth 3 (three) times daily. [provider] Taking Active Self  vitamin B-12 (CYANOCOBALAMIN) 1000 MCG tablet [id number] Yes Take 1,000 mcg by mouth daily. [provider] Taking Active Self  vortioxetine HBr  (TRINTELLIX) 10 MG TABS tablet [id number] Yes Take 1 tablet (10 mg total) by mouth daily. [name], MD Taking Active   zinc gluconate 50 MG tablet [id number] Yes Take 50 mg by mouth daily. [provider] Taking Active Self            Patient Active Problem List   Diagnosis Date Noted   NSTEMI (non-ST elevated myocardial infarction) ([place]) 03/05/2021   Acute renal failure superimposed on stage 4 chronic kidney disease ([place]) 08/22/2020   Hypoalbuminemia 05/25/2020   GERD (gastroesophageal reflux disease) 05/25/2020   Pressure injury of skin 05/17/2020   Type 2 diabetes mellitus with diabetic polyneuropathy, with long-term current use of insulin ([place]) 03/07/2020   Obesity, Class III, BMI 40-49.9 (morbid obesity) ([place]) 03/07/2020   Common bile duct (CBD) obstruction 05/28/2019   Benign neoplasm of ascending colon    Benign neoplasm of transverse colon    Benign neoplasm of descending colon    Benign neoplasm of sigmoid colon    Gastric polyps    Hyperkalemia 03/11/2019   Prolonged QT interval 03/11/2019   Onychomycosis 06/21/2018   Osteomyelitis of second toe of right foot ([place])    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 ([place])    PVD (peripheral vascular disease) ([place]) 10/26/2017   E-coli UTI 07/27/2017   Hypothyroidism 07/27/2017   PAH (pulmonary artery hypertension) ([facility])    Impaired ambulation 07/19/2017   Leg cramps 02/27/2017   Peripheral edema 01/12/2017   Diabetic neuropathy ([place]) 11/12/2016   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 mellitus ([place]) 10/24/2015   Anemia 10/03/2015   Generalized anxiety disorder 10/03/2015   Insomnia 10/03/2015   Hyperglycemia due to diabetes mellitus ([place]) 06/07/2015   Chronic diastolic CHF (congestive heart failure) ([place]) 06/07/2015   Non compliance with medical treatment 04/17/2014   Rotator cuff tear 03/14/2014   Class 3 obesity ([place]) 09/23/2013   Hypotension 12/25/2012   Urinary incontinence    MDD  (major depressive disorder) 11/12/2010   RBBB (right bundle branch block)    Coronary artery disease    Hyperlipemia 01/22/2009   Essential hypertension 01/22/2009    Immunization History  Administered Date(s) Administered   DTaP 07/19/2012   Fluad Quad(high Dose 65+) 01/17/2019   Influenza Split 02/23/2013   Influenza, High Dose Seasonal PF 03/03/2016   Influenza, Quadrivalent, Recombinant, Inj, Pf 02/01/2018, 02/21/2020   Influenza,inj,Quad PF,6+ Mos 02/02/2014, 03/26/2015, 02/27/2017   Influenza-Unspecified 02/01/2018, 02/21/2020   Moderna Sars-Covid-2 Vaccination 07/30/2019, 08/27/2019, 06/18/2020   Pneumococcal Conjugate-13 06/02/2017   Pneumococcal Polysaccharide-23 12/26/2012, 07/12/2015   Zoster Recombinat (Shingrix) 06/02/2017, 09/25/2017    Conditions to be addressed/monitored:  CHF, HTN, NSTEMI, DM, Stage 4 CKD, GAD, Insomnia     Patient Care Plan: General Pharmacy (Adult)     Problem Identified: CHF,  HTN, NSTEMI, DM, Stage 4 CKD, GAD, Insomnia   Priority: High  Onset Date: 05/23/2021     Long-Range Goal: Patient-Specific Goal   Start Date: 05/23/2021  Expected End Date: 11/22/2021  This Visit's Progress: On track  Priority: High  Note:   Current Barriers:  Unable to achieve control of glucose   Pharmacist Clinical Goal(s):  Patient will achieve control of glucose as evidenced by improved A1c through collaboration with PharmD and provider.   Interventions: 1:1 collaboration with [name], MD regarding development and update of comprehensive plan of care as evidenced by provider attestation and co-signature Inter-disciplinary care team collaboration (see longitudinal plan of care) Comprehensive medication review performed; medication list updated in electronic medical record  Hypertension (BP goal <130/80) -Controlled -Current treatment: Carvedilol 12.25m BID Imdur 359mdaily -Medications previously tried: losartan  -Current home readings:  "normal" no readings to provide -Current dietary habits: see DM -Current exercise habits: minimal at home -Denies hypotensive/hypertensive symptoms -Educated on BP goals and benefits of medications for prevention of heart attack, stroke and kidney damage; Importance of home blood pressure monitoring; Symptoms of hypotension and importance of maintaining adequate hydration; -Counseled to monitor BP at home a few times per week, document, and provide log at future appointments -Recommended to continue current medication Contact providers if BP is consistently elevated > 130/90.  Diabetes (A1c goal <7%) -Not ideally controlled -Current medications: Januvia 2582maily Humulin N 15 units am if glucose > 300 Humulin R via sliding scale -Medications previously tried: metformin  -Current home glucose readings - checking sugar 3-4 times per day Unclear when her readings were from but the majority of her readings are > 200 with on reading close to 600 and a few that were undetectable -Denies hypoglycemic/hyperglycemic symptoms -Current meal patterns:  breakfast: [place]  lunch: soups - chicken noodle, tomato, chicken and rice  dinner: chicken or pork, meatloaf, tacos in a tortilla snacks: fig bar, crackers, oranges, apples, gelatin w/ fruit? drinks: pepsi, gatorade, pedialyte, water -Current exercise: none -Educated on A1c and blood sugar goals; Complications of diabetes including kidney damage, retinal damage, and cardiovascular disease; Prevention and management of hypoglycemic episodes; Benefits of routine self-monitoring of blood sugar; Considerable time spent on counseling her on diet changes and ways she could change her current diet to bring down her glucose. -Counseled to check feet daily and get yearly eye exams -Counseled on diet and exercise extensively Will collaborate with endocrine, per patient report she is not having a basal rate through her omnipod.  She only gives the  NPH insulin when her glucose is > 300 in the morning. Would recommend basal/bolus regimen for her based on severely uncontrolled glucose.  Heart Failure (Goal: manage symptoms and prevent exacerbations) -Controlled -Last ejection fraction: 55-60%  -Current treatment: Torsemide 20m39mD Carvedilol 12.5mg 51m Metolazone 2.5mg -15mce weekly -Medications previously tried: none noted  -Current home BP/HR readings: 'normal" -Current dietary habits: see DM -Current exercise habits: none -Educated on Benefits of medications for managing symptoms and prolonging life Importance of weighing daily; if you gain more than 3 pounds in one day or 5 pounds in one week, contact providers Proper diuretic administration and potassium supplementation -Counseled on diet and exercise extensively Recommended to continue current medication  Patient Goals/Self-Care Activities Patient will:  - take medications as prescribed as evidenced by patient report and record review check glucose daily, document, and provide at future appointments collaborate with provider on medication access solutions  Follow Up Plan: The  care management team will reach out to the patient again over the next 90 days.         Medication Assistance: Application for [employer]  medication assistance program. in process.  Anticipated assistance start date unknown.  See plan of care for additional detail.  Compliance/Adherence/Medication fill history: Care Gaps: Foot exam DEXA scan   Patient's preferred pharmacy is:  [id number] [id number]: [phone]: [phone] Uses pill box? Yes Pt endorses 100% compliance  We discussed: Benefits of medication synchronization, packaging and delivery as well as enhanced pharmacist oversight with [employer]. Patient decided to: Continue current medication management  strategy  Care Plan and Follow Up Patient Decision:  Patient agrees to Care Plan and Follow-up.  Plan: The care management team will reach out to the patient again over the next 90 days.  CBeverly Milch PharmD, CPP Clinical Pharmacist Practitioner [phone]                  Current Barriers:  Knowledge deficit related to basic heart failure pathophysiology and self care management- pt lives with spouse, pt is independent in most all aspects of her care, has walker and cane to use as needed, continues to drive. Reports does not adhere to a special diet, pt does weigh daily and record, checks BP per doctor instructions as has been elevated recently. Patient reports she has not weighed since last Friday due to not feeling like it, has been tired with cold like symptoms and elevated blood sugar, she has not contacted her doctor. Financial strain- cannot afford ursodiol (290$ per month), would like pharmacist to outreach Does not adhere to provider recommendations re: low sodium, heart healthy diet, does not exercise Case Manager Clinical Goal(s):  patient will weigh self daily and record patient will verbalize understanding of Heart Failure Action Plan and when to call doctor patient will take all Heart Failure mediations as prescribed patient will weigh daily and record (notifying MD of 3 lb weight gain over night or 5 lb in a week) Interventions:  Collaboration with PSusy Frizzle MD regarding development and update of comprehensive plan of care as evidenced by provider attestation and co-signature Inter-disciplinary care team collaboration (see longitudinal plan of care) Basic overview and discussion of pathophysiology of Heart Failure reviewed  Reinforced verbal education on low sodium diet Reinforced Heart Failure Action Plan Provided education about placing scale on hard, flat surface Advised patient to weigh each morning after emptying  bladder Reinforced importance of daily weight and advised patient to weigh and record daily Reviewed role of diuretics in prevention of fluid overload and management of heart failure Reviewed importance of light exercise and getting outdoors every day Reviewed all upcoming scheduled appointments Reviewed importance of contacting doctor early on for change in health status/ symtpoms Self-Care Activities: Attends all scheduled provider appointments Calls pharmacy for medication refills Patient Goals: - continue to follow rescue plan - eat more whole grains, fruits and vegetables, lean meats and healthy fats - follow rescue plan if symptoms flare-up - know when to call the doctor  - follow low sodium, heart healthy- read labels, avoid fast food and salty snacks - take all medications as prescribed - continue to weigh daily - make an activity or exercise plan - track weight in diary - use salt in moderation - watch for swelling in feet, ankles  and legs every day - weigh myself daily Follow Up Plan: Telephone follow up appointment with care management team member scheduled for:   03/18/2021    Patient Care Plan: Diabetes Type 2 (Adult)  Completed 04/08/2021   Problem Identified: Glycemic Management (Diabetes, Type 2) Resolved 04/08/2021  Priority: High     Long-Range Goal: Glycemic Management Optimized Completed 04/08/2021  Start Date: 02/04/2021  Expected End Date: 08/04/2021  Recent Progress: Not on track  Priority: High  Note:   Resolving due to duplicate goal  Objective:  Lab Results  Component Value Date   HGBA1C 10.3 (H) 08/26/2020   Lab Results  Component Value Date   CREATININE 3.54 (H) 02/01/2021   CREATININE 3.65 (H) 11/29/2020   CREATININE 3.85 (H) 11/15/2020   No results found for: EGFR Current Barriers:  Knowledge Deficits related to basic Diabetes pathophysiology and self care/management-  Patient reports she has felt sick since last Friday with cold like  symptoms, cough and elevated blood sugar, states she has not called and reported to her doctor. CBG fasting 410 today, 336 yesterday evening random ranges all above 300 with some readings 400-500's range, pt does not follow special diet.   Knowledge Deficits related to medications used for management of diabetes- needs reinforcement Financial Constraints Does not adhere to provider recommendations re:   ADA/ carbohydrate modified diet Unable to perform ADLs independently- uses cane and walker as needed Case Manager Clinical Goal(s):  patient will demonstrate improved adherence to prescribed treatment plan for diabetes self care/management as evidenced by: daily monitoring and recording of CBG  adherence to ADA/ carb modified diet adherence to prescribed medication regimen contacting provider for new or worsened symptoms or questions Interventions:  Collaboration with [name], MD regarding development and update of comprehensive plan of care as evidenced by provider attestation and co-signature Inter-disciplinary care team collaboration (see longitudinal plan of care) Provided education to patient about basic DM disease process Reviewed medications with patient and discussed importance of medication adherence Discussed plans with patient for ongoing care management follow up and provided patient with direct contact information for care management team Provided patient with verbal educational related to hypo and hyperglycemia and importance of correct treatment Reviewed scheduled/upcoming provider appointments including: Dr. Sharol Given 11/15,  [facility]21 Reminded patient referral made to pharmacy team for assistance with medication costs - ursodiol Review of patient status, including review of consultants reports, relevant laboratory and other test results, and medications completed Reinforced ADA/ carbohydrate modified diet Encouraged pt to avoid sugary soft drinks Reviewed  effects of uncontrolled diabetes on overall health Ask patient to call primary care provider's office today and make appointment to be seen due to cold like symptoms, cough and elevated blood sugar Spoke with patient again who reports she did call primary care provider office, left a voicemail and awaiting return phone call In basket sent to primary care provider with update Self-Care Activities  Self administers oral medications as prescribed Attends all scheduled provider appointments Checks blood sugars as prescribed and utilize hyper and hypoglycemia protocol as needed Adheres to prescribed ADA/carb modified Patient Goals: - continue to check blood sugar at prescribed times - check blood sugar if I feel it is too high or too low - enter blood sugar readings into a log and take to all doctors visits - take the blood sugar meter to all doctor visits  - please practice portion control and be mindful of carbohydrate intake - please avoid sugary drinks and  concentrated sweets - please call primary care provider today and make an appointment for tomorrow if possible due to having cold like symptoms and elevated blood sugar - change to whole grain breads, cereal, pasta - fill half of plate with vegetables - limit fast food meals to no more than 1 per week - manage portion size - prepare main meal at home 3 to 5 days each week - read food labels for fat, fiber, carbohydrates and portion size - switch to sugar-free drinks - check feet daily for cuts, sores or redness - keep feet up while sitting - trim toenails straight across - wash and dry feet carefully every day - wear comfortable, cotton socks - wear comfortable, well-fitting shoes Follow Up Plan: Telephone follow up appointment with care management team member scheduled for:   03/18/2021    Patient Care Plan: RN Care Manager plan of care  Completed 10/01/2021   Problem Identified: No plan of care established for management of  chronic disease states (CHF, DM2, HTN, HLD, CKD stage 4) Resolved 10/01/2021  Priority: High     Long-Range Goal: Development of plan of care for chronic disease management (CHF, DM2, HTN, CAD, HLD, CKD stage 4) Completed 10/01/2021  Start Date: 04/08/2021  Expected End Date: 10/05/2021  Priority: High  Note:   Care plan updated/ resolved, unable to maintain contact with patient Current Barriers:  Knowledge Deficits related to plan of care for management of CHF and DMII  Financial Constraints.   Pt reports her spouse does not want her to complete forms for medication assistance because of divulging income information, etc.and she did not complete Knowledge deficit related to basic heart failure pathophysiology and self care management- pt lives with spouse, pt is independent in most all aspects of her care, has walker and cane to use as needed, continues to drive. Pt reports she forgets sometimes to weigh, weight ranges 260-265 pounds. Pt in hospital 03/05/21 for NSTEMI, has nitroglycerin on hand if needed,, reports she is taking medications as prescribed except for ursodiol which she cannot afford.  Patient reports [employer] continues to work well with alleviating symptoms of depression and she would like to continue taking.  Pt reports she is no longer having legs wrapped with unna boots and is wearing compression hose daily, states she will be seeing primary care provider 07/11/21 and will discuss issues with her legs as she feels like " there are some tiny places that look like they're going to open up again" Patient reports CBG improved and she checks blood sugar 2 x per day with fasting ranges 200's most everyday and today's reading 245, pt reports she has a problem with sweets and drinks sugary colas most days and unable to go without caffeine.  RNCM Clinical Goal(s):  Patient will verbalize understanding of plan for management of CHF and DMII as evidenced by review of EHR, pt report. take all  medications exactly as prescribed and will call provider for medication related questions as evidenced by review of EHR, per pt report and collaboration with CCM pharmacist.    attend all scheduled medical appointments: 06/04/21 primary care provider  as evidenced by review of EHR, pt report and        through collaboration with RN Care manager, provider, and care team.   Interventions: 1:1 collaboration with primary care provider regarding development and update of comprehensive plan of care as evidenced by provider attestation and co-signature Inter-disciplinary care team collaboration (see longitudinal plan of care) Evaluation of  current treatment plan related to  self management and patient's adherence to plan as established by provider  Diabetes Interventions: Assessed patient's understanding of A1c goal: <7% Reviewed medications with patient and discussed importance of medication adherence Reviewed scheduled/upcoming provider appointments including:  07/11/2021 primary care provider Review of patient status, including review of consultants reports, relevant laboratory and other test results, and medications completed In basket sent to CCM pharmacist reporting patient may not complete forms for assistance with medications Reviewed carbohydrate modified diet and reviewed suitable food choices Reinforced with patient to drink unsweetened tea with maybe lemon and importance of drinking water Reviewed signs/ symptoms hypo/hyperglycemia and actions to take Reviewed signs/ symptoms of infection and actions to take, reviewed effect on blood sugar Lab Results  Component Value Date   HGBA1C 12.2 (H) 03/05/2021  Heart Failure Interventions: Discussed importance of daily weight and advised patient to weigh and record daily Reinforced low sodium diet Reviewed HF action plan with emphasis on yellow zone Reviewed importance of having nitroglycerin on hand and reviewed instructions on how to take and  calling 911 if no relief Encouraged patient to continue wearing compression hose daily Pain assessment completed  Patient Goals/Self-Care Activities: Patient will self administer medications as prescribed as evidenced by self report/primary caregiver report  Patient will attend all scheduled provider appointments as evidenced by clinician review of documented attendance to scheduled appointments and patient/caregiver report Patient will call provider office for new concerns or questions as evidenced by review of documented incoming telephone call notes and patient report  Continue to weigh daily- Call your doctor if you gain 3 pounds overnight or 5 pounds in a week Continue wearing compression hose daily Follow low sodium diet- limit fast food Follow carbohydrate modified diet- be mindful of carbohydrate intake at each meal Eliminate sugary soft drinks, try unsweetened tea with lemon instead and water is always best If you have chest pain, use nitroglycerin x 3, if no relief, call 911 Go to emergency room if your sores on your legs worsen with redness, drainage, fever  Practice good handwashing Follow up with primary care provider 07/11/21 Call RN care manager for any questions at [phone]      Patient Care Plan: General Pharmacy (Adult)     Problem Identified: CHF, HTN, NSTEMI, DM, Stage 4 CKD, GAD, Insomnia   Priority: High  Onset Date: 05/23/2021     Long-Range Goal: Patient-Specific Goal   Start Date: 05/23/2021  Expected End Date: 11/22/2021  This Visit's Progress: On track  Priority: High  Note:   Current Barriers:  Unable to achieve control of glucose   Pharmacist Clinical Goal(s):  Patient will achieve control of glucose as evidenced by improved A1c through collaboration with PharmD and provider.   Interventions: 1:1 collaboration with [name], MD regarding development and update of comprehensive plan of care as evidenced by provider attestation and  co-signature Inter-disciplinary care team collaboration (see longitudinal plan of care) Comprehensive medication review performed; medication list updated in electronic medical record  Hypertension (BP goal <130/80) -Controlled -Current treatment: Carvedilol 12.39m BID Imdur 368mdaily -Medications previously tried: losartan  -Current home readings: "normal" no readings to provide -Current dietary habits: see DM -Current exercise habits: minimal at home -Denies hypotensive/hypertensive symptoms -Educated on BP goals and benefits of medications for prevention of heart attack, stroke and kidney damage; Importance of home blood pressure monitoring; Symptoms of hypotension and importance of maintaining adequate hydration; -Counseled to monitor BP at home a few times per week,  document, and provide log at future appointments -Recommended to continue current medication Contact providers if BP is consistently elevated > 130/90.  Diabetes (A1c goal <7%) -Not ideally controlled -Current medications: Januvia 31m daily Humulin N 15 units am if glucose > 300 Humulin R via sliding scale -Medications previously tried: metformin  -Current home glucose readings - checking sugar 3-4 times per day Unclear when her readings were from but the majority of her readings are > 200 with on reading close to 600 and a few that were undetectable -Denies hypoglycemic/hyperglycemic symptoms -Current meal patterns:  breakfast: [place]  lunch: soups - chicken noodle, tomato, chicken and rice  dinner: chicken or pork, meatloaf, tacos in a tortilla snacks: fig bar, crackers, oranges, apples, gelatin w/ fruit? drinks: pepsi, gatorade, pedialyte, water -Current exercise: none -Educated on A1c and blood sugar goals; Complications of diabetes including kidney damage, retinal damage, and cardiovascular disease; Prevention and management of hypoglycemic episodes; Benefits of routine self-monitoring of blood  sugar; Considerable time spent on counseling her on diet changes and ways she could change her current diet to bring down her glucose. -Counseled to check feet daily and get yearly eye exams -Counseled on diet and exercise extensively Will collaborate with endocrine, per patient report she is not having a basal rate through her omnipod.  She only gives the NPH insulin when her glucose is > 300 in the morning. Would recommend basal/bolus regimen for her based on severely uncontrolled glucose.  Heart Failure (Goal: manage symptoms and prevent exacerbations) -Controlled -Last ejection fraction: 55-60%  -Current treatment: Torsemide 854mBID Carvedilol 12.67m71mID Metolazone 2.67mg41monce weekly -Medications previously tried: none noted  -Current home BP/HR readings: 'normal" -Current dietary habits: see DM -Current exercise habits: none -Educated on Benefits of medications for managing symptoms and prolonging life Importance of weighing daily; if you gain more than 3 pounds in one day or 5 pounds in one week, contact providers Proper diuretic administration and potassium supplementation -Counseled on diet and exercise extensively Recommended to continue current medication  Patient Goals/Self-Care Activities Patient will:  - take medications as prescribed as evidenced by patient report and record review check glucose daily, document, and provide at future appointments collaborate with provider on medication access solutions  Follow Up Plan: The care management team will reach out to the patient again over the next 90 days.

## 2021-12-10 ENCOUNTER — Ambulatory Visit (HOSPITAL_BASED_OUTPATIENT_CLINIC_OR_DEPARTMENT_OTHER)
Admission: RE | Admit: 2021-12-10 | Discharge: 2021-12-10 | Disposition: A | Discharge status: Home / Self Care | Payer: HMO | Source: Ambulatory Visit | Attending: Family Medicine | Admitting: Family Medicine

## 2021-12-10 ENCOUNTER — Encounter (HOSPITAL_COMMUNITY): Payer: Self-pay

## 2021-12-10 ENCOUNTER — Ambulatory Visit (INDEPENDENT_AMBULATORY_CARE_PROVIDER_SITE_OTHER): Payer: PPO | Admitting: Family Medicine

## 2021-12-10 ENCOUNTER — Ambulatory Visit (HOSPITAL_COMMUNITY)
Admission: RE | Admit: 2021-12-10 | Discharge: 2021-12-10 | Disposition: A | Discharge status: Home / Self Care | Payer: HMO | Source: Ambulatory Visit | Attending: Family Medicine | Admitting: Family Medicine

## 2021-12-10 ENCOUNTER — Ambulatory Visit: Payer: HMO | Admitting: Family Medicine

## 2021-12-10 VITALS — BP 150/84 | HR 72 | Wt 241.8 lb

## 2021-12-10 VITALS — BP 118/56 | HR 76 | Temp 97.6°F | Ht 66.0 in | Wt 241.0 lb

## 2021-12-10 DIAGNOSIS — I959 Hypotension, unspecified: Secondary | ICD-10-CM | POA: Insufficient documentation

## 2021-12-10 DIAGNOSIS — I252 Old myocardial infarction: Secondary | ICD-10-CM | POA: Diagnosis not present

## 2021-12-10 DIAGNOSIS — I5042 Chronic combined systolic (congestive) and diastolic (congestive) heart failure: Secondary | ICD-10-CM | POA: Insufficient documentation

## 2021-12-10 DIAGNOSIS — I5032 Chronic diastolic (congestive) heart failure: Secondary | ICD-10-CM | POA: Diagnosis not present

## 2021-12-10 DIAGNOSIS — I5022 Chronic systolic (congestive) heart failure: Secondary | ICD-10-CM

## 2021-12-10 DIAGNOSIS — E1165 Type 2 diabetes mellitus with hyperglycemia: Secondary | ICD-10-CM

## 2021-12-10 DIAGNOSIS — E039 Hypothyroidism, unspecified: Secondary | ICD-10-CM | POA: Insufficient documentation

## 2021-12-10 DIAGNOSIS — Z79899 Other long term (current) drug therapy: Secondary | ICD-10-CM | POA: Diagnosis not present

## 2021-12-10 DIAGNOSIS — I4510 Unspecified right bundle-branch block: Secondary | ICD-10-CM | POA: Diagnosis not present

## 2021-12-10 DIAGNOSIS — I2510 Atherosclerotic heart disease of native coronary artery without angina pectoris: Secondary | ICD-10-CM | POA: Insufficient documentation

## 2021-12-10 DIAGNOSIS — S81802D Unspecified open wound, left lower leg, subsequent encounter: Secondary | ICD-10-CM | POA: Diagnosis not present

## 2021-12-10 DIAGNOSIS — M7989 Other specified soft tissue disorders: Secondary | ICD-10-CM | POA: Diagnosis not present

## 2021-12-10 DIAGNOSIS — I130 Hypertensive heart and chronic kidney disease with heart failure and stage 1 through stage 4 chronic kidney disease, or unspecified chronic kidney disease: Secondary | ICD-10-CM | POA: Diagnosis not present

## 2021-12-10 DIAGNOSIS — Z7902 Long term (current) use of antithrombotics/antiplatelets: Secondary | ICD-10-CM | POA: Insufficient documentation

## 2021-12-10 DIAGNOSIS — E1142 Type 2 diabetes mellitus with diabetic polyneuropathy: Secondary | ICD-10-CM | POA: Diagnosis not present

## 2021-12-10 DIAGNOSIS — E1122 Type 2 diabetes mellitus with diabetic chronic kidney disease: Secondary | ICD-10-CM | POA: Insufficient documentation

## 2021-12-10 DIAGNOSIS — I10 Essential (primary) hypertension: Secondary | ICD-10-CM | POA: Diagnosis not present

## 2021-12-10 DIAGNOSIS — Z8673 Personal history of transient ischemic attack (TIA), and cerebral infarction without residual deficits: Secondary | ICD-10-CM | POA: Insufficient documentation

## 2021-12-10 DIAGNOSIS — N184 Chronic kidney disease, stage 4 (severe): Secondary | ICD-10-CM

## 2021-12-10 MED ORDER — ISOSORBIDE MONONITRATE ER 30 MG PO TB24: 15.0000 mg | ORAL_TABLET | Freq: Every day | ORAL | 3 refills | Status: DC

## 2021-12-10 MED ORDER — TORSEMIDE 20 MG PO TABS: 80.0000 mg | ORAL_TABLET | Freq: Two times a day (BID) | ORAL | 3 refills | Status: DC

## 2021-12-10 MED ORDER — METOLAZONE 2.5 MG PO TABS: 2.5000 mg | ORAL_TABLET | ORAL | 3 refills | Status: DC

## 2021-12-10 NOTE — Progress Notes (Signed)
Advanced Heart Failure Clinic Note   Primary Physician [name], MD Nephrology: Dr Hollie Salk HF Cardiologist: Glori Bickers, MD   Reason for Visit:  F/u for Chronic Diastolic Heart Failure  HPI:  Susan [name] is a 71 y.o. female with morbid obesity, RBBB, DM2, Hypothyroidism diastolic heart failure, CAD MI 2000/2007 BMS 2007, TIA, CKD Stage IV and PAD s/p R transmetatarsal amputation.   Has had frequent hospital admits since 2017.  Had home sleep study 07/25/20 and this was negative for OSA.   Admitted 3/22  with AKI and progressive volume overloaded in setting of recurrent UTI and probable ATN. She was placed on IV abx and IV lasix w/ improved renal function and volume status. SCr was down to 1.78 day of d/c. PT recommended SNF however pt declined. She was discharged home on 3/27 but readmitted 2 days later for urinary retention and recurrent AKI. Marland Kitchen  Admitted 10/22 with NSTEMI (hstrop 247). Echo with decline in EF to 30-35% and new inferior WMA. Suspect progression in RCA disease.Managed medically due to CKD IV (Cr ~3.5).  AHF consulted for management. GDMT limited by renal disease. Hospitalization c/b AKI and UTI. Discharge weight 268 lbs.  Follow up 12/22, stable NYHA IIIb, volume OK. Hydralazine stopped with marginal BP.  Today she returns for HF follow up with her husband. Overall feeling fine. Has been off metolazone x 1 month, ran out of med. Wears 2L oxygen at night. She has SOB walking further distances on flat ground, uses a walker around the house and [facility] when she is outside the home. Has a wound on RLE with some swelling and PCP arranged for unna wrap and is following. Denies palpitations, abnormal bleeding, CP, dizziness,  or PND/Orthopnea. Appetite ok. No fever or chills. Weight at home 252 pounds. Taking all medications. Saw Nephrology last week, Imdur was stopped. Took a nitro this past week for CP. Blood sugars have been "up and down."   Echo today (12/10/21),  results pending.    Cardiac Studies:  - Echo (10/22): EF 30-35% - Echo (9/21): EF 55-60%, RV ok - Echo (10/19): EF 55-60%, Grade I DD  - RHC (07/24/17): showed mild PAH with evidence of RV strain likely due to OHS/OSA.   RA = 17 RV = 48/15 PA = 49/11 (30) PCW = 18 Fick cardiac output/index = 7.0/3.0 PVR = 0.5 WU Ao sat = 97% PA sat = 62%, 64% Assessment: 1. Normal left-sided pressures 2. Mild PAH with evidence of RV strain likely due to OHS/OSA 3. Normal cardiac output  - Lexiscan w/ stress echo (2018): EF 68%, low risk  - LHC (2007): BMS distal RCA - Cardiolite (03/2002): EF of 51% but no ischemia - LHC (2000): BMS RCA   ROS: All systems reviewed and negative except as per HPI.   Current Meds  Medication Sig   acetaminophen (TYLENOL) 500 MG tablet Take 1,000 mg by mouth every 6 (six) hours as needed for moderate pain or headache.   albuterol (VENTOLIN HFA) 108 (90 Base) MCG/ACT inhaler TAKE 2 PUFFS BY MOUTH EVERY 6 HOURS AS NEEDED FOR WHEEZE OR SHORTNESS OF BREATH   Ascorbic Acid (VITAMIN C) 1000 MG tablet Take 1,000 mg by mouth daily.   aspirin EC 81 MG tablet Take 1 tablet (81 mg total) by mouth daily with breakfast.   atorvastatin (LIPITOR) 80 MG tablet TAKE 1 TABLET BY MOUTH EVERY DAY   carvedilol (COREG) 12.5 MG tablet TAKE 1 TABLET (12.'5MG'$  TOTAL) BY MOUTH  TWICE A DAY WITH A MEAL   cholecalciferol (VITAMIN D) 1000 units tablet Take 1,000 Units by mouth daily with supper.    clopidogrel (PLAVIX) 75 MG tablet TAKE 1 TABLET BY MOUTH EVERY DAY WITH BREAKFAST   Continuous Blood Gluc Receiver (FREESTYLE LIBRE 2 READER) DEVI See admin instructions.   Cranberry 250 MG TABS Take 500 mg by mouth daily.   diphenoxylate-atropine (LOMOTIL) 2.5-0.025 MG tablet Take 1 tablet by mouth 4 (four) times daily as needed for diarrhea or loose stools.   FERROUS SULFATE PO Take 325 mg by mouth 2 (two) times daily.   guaiFENesin-dextromethorphan (ROBITUSSIN DM) 100-10 MG/5ML syrup Take 10  mLs by mouth every 4 (four) hours as needed for cough.   hydrocortisone cream 0.5 % Apply 1 application topically 2 (two) times daily. (Patient taking differently: Apply 1 application  topically 2 (two) times daily. As needed)   Insulin Disposable Pump (OMNIPOD DASH 5 PACK PODS) MISC Inject into the skin See admin instructions. Use continuously with Novolin R - change every 72 hours.   insulin NPH Human (HUMULIN N,NOVOLIN N) 100 UNIT/ML injection Inject 15 Units into the skin See admin instructions. Inject 15 units subcutaneously in the morning if CBG >300;   insulin regular (NOVOLIN R) 100 units/mL injection Inject into the skin See admin instructions. Manually add bolus to continuous dose via OmniPod 3 times daily per sliding scale (CBG 80-150 7 units, 151-200 9 units, 201-250 12 units, 251-300 14 units, 301- 400 17 units)   levothyroxine (SYNTHROID, LEVOTHROID) 50 MCG tablet Take 1 tablet (50 mcg total) by mouth daily before breakfast.   magnesium oxide (MAG-OX) 400 (240 Mg) MG tablet Take 1 tablet (400 mg total) by mouth daily.   metolazone (ZAROXOLYN) 2.5 MG tablet Take 1 tablet (2.5 mg total) by mouth daily.   Multiple Vitamins-Minerals (AIRBORNE GUMMIES PO) Take 2 tablets by mouth every evening.   Multiple Vitamins-Minerals (CENTRUM SILVER 50+WOMEN) TABS Take 1 tablet by mouth every evening.   nitroGLYCERIN (NITROSTAT) 0.4 MG SL tablet PLACE 1 TABLET (0.4 MG TOTAL) UNDER THE TONGUE EVERY 5 (FIVE) MINUTES AS NEEDED FOR CHEST PAIN.   ONETOUCH VERIO test strip 3 (three) times daily.   polyethylene glycol (MIRALAX / GLYCOLAX) 17 g packet Take 17 g by mouth as needed for mild constipation.   pregabalin (LYRICA) 150 MG capsule Take 150 mg by mouth 2 (two) times daily.   sitaGLIPtin (JANUVIA) 25 MG tablet Take 1 tablet (25 mg total) by mouth daily.   torsemide (DEMADEX) 20 MG tablet Take 80 mg by mouth 2 (two) times daily.   TRINTELLIX 10 MG TABS tablet TAKE 1 TABLET BY MOUTH EVERY DAY   ursodiol  (ACTIGALL) 500 MG tablet Take 500 mg by mouth 3 (three) times daily.   vitamin B-12 (CYANOCOBALAMIN) 1000 MCG tablet Take 1,000 mcg by mouth daily.   zinc gluconate 50 MG tablet Take 50 mg by mouth daily.   Allergies  Allergen Reactions   Drug Ingredient [Cephalexin] Diarrhea    Pt reports heavy diarrhea with use of keflex   Codeine Nausea And Vomiting and Other (See Comments)   Past Medical History:  Diagnosis Date   Acute MI ([place]) 1999; 2007   Anemia    hx   Anginal pain ([place])    Anxiety    ARF (acute renal failure) ([place]) 06/2017   [facility]   Arthritis    "generalized" (03/15/2014)   CAD (coronary artery disease)    MI in 2000 -  MI  2007 - treated bare metal stent (no nuclear since then as 9/11)   Carotid artery disease (HCC)    CHF (congestive heart failure) ([place])    Chronic diastolic heart failure ([place])    a) ECHO (08/2013) EF 55-60% and RV function nl b) RHC (08/2013) RA 4, RV 30/5/7, PA 25/10 (16), PCWP 7, Fick CO/CI 6.3/2.7, PVR 1.5 WU, PA 61 and 66%   Daily headache    "~ every other day; since I fell in June" (03/15/2014)   Depression    Dyslipidemia    Dyspnea    Exertional shortness of breath    HTN (hypertension)    Hypothyroidism    Neuropathy    Obesity    Osteoarthritis    Peripheral neuropathy    PONV (postoperative nausea and vomiting)    RBBB (right bundle branch block)    Old   Stroke ([place])    mini strokes   Syncope    likely due to low blood sugar   Tachycardia    Sinus tachycardia   Type II diabetes mellitus (HCC)    Type II   Urinary incontinence    Venous insufficiency    Family History  Problem Relation Age of Onset   Heart attack Mother 51   Past Surgical History:  Procedure Laterality Date   ABDOMINAL HYSTERECTOMY  1980's   AMPUTATION Right 02/24/2018   Procedure: RIGHT FOOT GREAT TOE AND 2ND TOE AMPUTATION;  Surgeon: Newt Minion, MD;  Location: [place];  Service: Orthopedics;  Laterality: Right;   AMPUTATION Right  04/30/2018   Procedure: RIGHT TRANSMETATARSAL AMPUTATION;  Surgeon: Newt Minion, MD;  Location: [place];  Service: Orthopedics;  Laterality: Right;   BIOPSY  05/27/2020   Procedure: BIOPSY;  Surgeon: [name], DO;  [facility];  Service: Endoscopy;;   CATARACT EXTRACTION, BILATERAL Bilateral ?2013   COLONOSCOPY W/ POLYPECTOMY     COLONOSCOPY WITH PROPOFOL N/A 03/13/2019   Procedure: COLONOSCOPY WITH PROPOFOL;  Surgeon: Jerene Bears, MD;  Location: [place];  Service: Gastroenterology;  Laterality: N/A;   [place]; 2007   "1 + 1"   ESOPHAGOGASTRODUODENOSCOPY (EGD) WITH PROPOFOL N/A 03/13/2019   Procedure: ESOPHAGOGASTRODUODENOSCOPY (EGD) WITH PROPOFOL;  Surgeon: Jerene Bears, MD;  Location: [place];  Service: Gastroenterology;  Laterality: N/A;   ESOPHAGOGASTRODUODENOSCOPY (EGD) WITH PROPOFOL N/A 05/27/2020   Procedure: ESOPHAGOGASTRODUODENOSCOPY (EGD) WITH PROPOFOL;  Surgeon: [name], DO;  [facility];  Service: Endoscopy;  Laterality: N/A;   EYE SURGERY Bilateral    lazer   HEMOSTASIS CLIP PLACEMENT  03/13/2019   Procedure: HEMOSTASIS CLIP PLACEMENT;  Surgeon: Jerene Bears, MD;  [facility] ENDOSCOPY;  Service: Gastroenterology;;   KNEE ARTHROSCOPY Left 10/25/2006   POLYPECTOMY  03/13/2019   Procedure: POLYPECTOMY;  Surgeon: Jerene Bears, MD;  Location: [place];  Service: Gastroenterology;;   RIGHT HEART CATH N/A 07/24/2017   Procedure: RIGHT HEART CATH;  Surgeon: Jolaine Artist, MD;  Location: [place] CV LAB;  Service: Cardiovascular;  Laterality: N/A;   RIGHT HEART CATHETERIZATION N/A 09/22/2013   Procedure: RIGHT HEART CATH;  Surgeon: Jolaine Artist, MD;  Location: [facility] CATH LAB;  Service: Cardiovascular;  Laterality: N/A;   SHOULDER ARTHROSCOPY WITH OPEN ROTATOR CUFF REPAIR Right 03/14/2014   Procedure: RIGHT SHOULDER ARTHROSCOPY WITH BICEPS RELEASE, OPEN SUBSCAPULA REPAIR, OPEN  SUPRASPINATUS REPAIR.;  Surgeon: Meredith Pel, MD;  Location: [place];  Service:  Orthopedics;  Laterality: Right;   TOE AMPUTATION Right 02/24/2018   GREAT TOE AND 2ND TOE AMPUTATION   TUBAL LIGATION  1970's   Social History   Socioeconomic History   Marital status: Married    Spouse name: Percell Miller   Number of children: 3   Years of education: 12th   Highest education level: Not on file  Occupational History    Employer: UNEMPLOYED  Tobacco Use   Smoking status: Former    Packs/day: 3.00    Years: 32.00    Total pack years: 96.00    Types: Cigarettes    Quit date: 10/24/1997    Years since quitting: 24.1   Smokeless tobacco: Never  Vaping Use   Vaping Use: Never used  Substance and Sexual Activity   Alcohol use: Not Currently    Comment: "might have 2-3 daiquiris in the summer"   Drug use: No   Sexual activity: Not Currently  Other Topics Concern   Not on file  Social History Narrative   Pt lives at home with her spouse.Caffeine Use- 3 sodas daily.   Social Determinants of Health   Financial Resource Strain: Low Risk  (08/16/2021)   Overall Financial Resource Strain (CARDIA)    Difficulty of Paying Living Expenses: Not hard at all  Food Insecurity: No Food Insecurity (08/16/2021)   Hunger Vital Sign    Worried About Running Out of Food in the Last Year: Never true    Ran Out of Food in the Last Year: Never true  Transportation Needs: No Transportation Needs (08/16/2021)   PRAPARE - Hydrologist (Medical): No    Lack of Transportation (Non-Medical): No  Physical Activity: Inactive (08/16/2021)   Exercise Vital Sign    Days of Exercise per Week: 0 days    Minutes of Exercise per Session: 0 min  Stress: No Stress Concern Present (08/16/2021)   [place]    Feeling of Stress : Not at all  Social Connections: Moderately Isolated (08/16/2021)   Social Connection and Isolation  Panel [NHANES]    Frequency of Communication with Friends and Family: More than three times a week    Frequency of Social Gatherings with Friends and Family: More than three times a week    Attends Religious Services: Never    Marine scientist or Organizations: No    Attends Archivist Meetings: Never    Marital Status: Married  Human resources officer Violence: Not At Risk (08/16/2021)   Humiliation, Afraid, Rape, and Kick questionnaire    Fear of Current or Ex-Partner: No    Emotionally Abused: No    Physically Abused: No    Sexually Abused: No   Lipid Panel     Component Value Date/Time   CHOL 172 03/06/2021 0500   TRIG 150 (H) 03/06/2021 0500   HDL 51 03/06/2021 0500   CHOLHDL 3.4 03/06/2021 0500   VLDL 30 03/06/2021 0500   LDLCALC 91 03/06/2021 0500   LDLCALC 65 01/17/2019 1608   Current Outpatient Medications on File Prior to Encounter  Medication Sig Dispense Refill   acetaminophen (TYLENOL) 500 MG tablet Take 1,000 mg by mouth every 6 (six) hours as needed for moderate pain or headache.     albuterol (VENTOLIN HFA) 108 (90 Base) MCG/ACT inhaler TAKE 2 PUFFS BY MOUTH EVERY 6 HOURS AS NEEDED FOR WHEEZE OR SHORTNESS OF BREATH 8.5 g 1   Ascorbic Acid (VITAMIN C) 1000  MG tablet Take 1,000 mg by mouth daily.     aspirin EC 81 MG tablet Take 1 tablet (81 mg total) by mouth daily with breakfast. 30 tablet 11   atorvastatin (LIPITOR) 80 MG tablet TAKE 1 TABLET BY MOUTH EVERY DAY 90 tablet 1   carvedilol (COREG) 12.5 MG tablet TAKE 1 TABLET (12.'5MG'$  TOTAL) BY MOUTH TWICE A DAY WITH A MEAL 180 tablet 1   cholecalciferol (VITAMIN D) 1000 units tablet Take 1,000 Units by mouth daily with supper.      clopidogrel (PLAVIX) 75 MG tablet TAKE 1 TABLET BY MOUTH EVERY DAY WITH BREAKFAST 90 tablet 3   Continuous Blood Gluc Receiver (FREESTYLE LIBRE 2 READER) DEVI See admin instructions.     Cranberry 250 MG TABS Take 500 mg by mouth daily.     diphenoxylate-atropine (LOMOTIL)  2.5-0.025 MG tablet Take 1 tablet by mouth 4 (four) times daily as needed for diarrhea or loose stools. 30 tablet 0   FERROUS SULFATE PO Take 325 mg by mouth 2 (two) times daily.     guaiFENesin-dextromethorphan (ROBITUSSIN DM) 100-10 MG/5ML syrup Take 10 mLs by mouth every 4 (four) hours as needed for cough. 118 mL 0   hydrocortisone cream 0.5 % Apply 1 application topically 2 (two) times daily. (Patient taking differently: Apply 1 application  topically 2 (two) times daily. As needed) 30 g 0   Insulin Disposable Pump (OMNIPOD DASH 5 PACK PODS) MISC Inject into the skin See admin instructions. Use continuously with Novolin R - change every 72 hours.     insulin NPH Human (HUMULIN N,NOVOLIN N) 100 UNIT/ML injection Inject 15 Units into the skin See admin instructions. Inject 15 units subcutaneously in the morning if CBG >300;     insulin regular (NOVOLIN R) 100 units/mL injection Inject into the skin See admin instructions. Manually add bolus to continuous dose via OmniPod 3 times daily per sliding scale (CBG 80-150 7 units, 151-200 9 units, 201-250 12 units, 251-300 14 units, 301- 400 17 units)     levothyroxine (SYNTHROID, LEVOTHROID) 50 MCG tablet Take 1 tablet (50 mcg total) by mouth daily before breakfast. 90 tablet 1   magnesium oxide (MAG-OX) 400 (240 Mg) MG tablet Take 1 tablet (400 mg total) by mouth daily. 90 tablet 3   metolazone (ZAROXOLYN) 2.5 MG tablet Take 1 tablet (2.5 mg total) by mouth daily. 90 tablet 3   Multiple Vitamins-Minerals (AIRBORNE GUMMIES PO) Take 2 tablets by mouth every evening.     Multiple Vitamins-Minerals (CENTRUM SILVER 50+WOMEN) TABS Take 1 tablet by mouth every evening.     nitroGLYCERIN (NITROSTAT) 0.4 MG SL tablet PLACE 1 TABLET (0.4 MG TOTAL) UNDER THE TONGUE EVERY 5 (FIVE) MINUTES AS NEEDED FOR CHEST PAIN. 25 tablet 1   ONETOUCH VERIO test strip 3 (three) times daily.     polyethylene glycol (MIRALAX / GLYCOLAX) 17 g packet Take 17 g by mouth as needed for  mild constipation.     pregabalin (LYRICA) 150 MG capsule Take 150 mg by mouth 2 (two) times daily.     sitaGLIPtin (JANUVIA) 25 MG tablet Take 1 tablet (25 mg total) by mouth daily. 90 tablet 3   torsemide (DEMADEX) 20 MG tablet Take 80 mg by mouth 2 (two) times daily.     TRINTELLIX 10 MG TABS tablet TAKE 1 TABLET BY MOUTH EVERY DAY 30 tablet 5   ursodiol (ACTIGALL) 500 MG tablet Take 500 mg by mouth 3 (three) times daily.  vitamin B-12 (CYANOCOBALAMIN) 1000 MCG tablet Take 1,000 mcg by mouth daily.     zinc gluconate 50 MG tablet Take 50 mg by mouth daily.     No current facility-administered medications on file prior to encounter.   Wt Readings from Last 3 Encounters:  12/10/21 109.7 kg (241 lb 12.8 oz)  12/10/21 109.3 kg (241 lb)  08/22/21 117.5 kg (259 lb)   BP (!) 150/84   Pulse 72   Wt 109.7 kg (241 lb 12.8 oz)   SpO2 95%   BMI 39.03 kg/m   PHYSICAL EXAM: General:  NAD. No resp difficulty, arrived in [facility] HEENT: Normal Neck: Supple. JVP 7-8. Carotids 2+ bilat; no bruits. No lymphadenopathy or thryomegaly appreciated. Cor: PMI nondisplaced. Regular rate & rhythm. No rubs, gallops or murmurs. Lungs: Clear Abdomen: Obese, nontender, nondistended. No hepatosplenomegaly. No bruits or masses. Good bowel sounds. Extremities: No cyanosis, clubbing, rash, 1-2+ LLE edema w/ unna wrap in place Neuro: Alert & oriented x 3, cranial nerves grossly intact. Moves all 4 extremities w/o difficulty. Affect pleasant.  ASSESSMENT AND PLAN:  CAD  - Known CAD w/ h/o remote MI in 2000 s/p BMS to RCA and again in 2007 w/ BMS to RCA  - Admitted 10/22 NSTEM (hstrop 247) Echo with decline in EF to 30-35% and new inferior WMA. Suspect progression in RCA disease. No cath due CKD IV - Rare angina (stable).  - Restart Imdur 15 mg daily.  - Not candidate for cath with CKD IV unless ACS or persistent, severe angina. - Continue ASA + Plavix.  - Continue beta blocker. - Continue atorvastatin 80 mg  daily.   2. Chronic Diastolic CHF--> Now with reduced EF 30-35%.  - Echo 9/21 EF 55-60%, RV normal  - Echo (10/22): EF down to 30-35% in setting of NSTEMI, Grade II DD. - Echo today (12/10/21), results pending.  - Stable NYHA IIIb, confounded by morbid obesity and physical deconditioning. Volume looks mildly up today. - Restart metolazone 2.5/20 KCL every other Wednesday (previously on weekly metolazone)  - Continue torsemide 80 mg bid. - Continue carvedilol 12.5 mg bid. - Restart Imdur 15 mg daily as above. - Off hydralazine with previously low BP. - No dig, SGLT2i, ARNI with CKD IV. - Again reinforced need for dietary discretion  - Limited options, discussed she will likely need HD for volume removal in the future.  - She had labs at Nephrology visit last week, will request records. - BMET in 2 weeks.   3. CKD IV - Baseline SCr ~3.5. - Followed by Dr. Hollie Salk.  - Will request office notes from visit last week. - Need to avoid hypotension.   4. Type 2DM, uncontrolled  - poorly controlled, last hgb A1c 12.2 in 10/22 - No SGLT2i.  5. HTN - Elevated today.  - Restart Imdur as above.  6. LLE wound - Diurese as above. - Continue unna wrap - Continue follow up with PCP  Follow up in 3-4 months with Dr. Haroldine Laws.   Rafael Bihari, FNP  2:31 PM

## 2021-12-10 NOTE — Progress Notes (Signed)
  Echocardiogram 2D Echocardiogram has been performed.  Susan [name] 12/10/2021, 2:13 PM

## 2021-12-10 NOTE — Progress Notes (Signed)
Subjective:    Patient ID: Susan [name], female    DOB: [birth year], 71 y.o.   MRN: [id number] Patient states that her left leg has been swollen significantly for about 2 weeks.  She suffered a skin tear to the lateral aspect of the left anterior skin about a week ago.  This is more centimeters long and 1 cm wide.  It is very shallow but bleeding.  She reports burning and stinging pain in the leg.  There is +2 pitting edema all the way up to her knee.  She is wearing her compression hose on her right leg.  That leg the swelling is controlled however she is not wearing compression hose on the left leg due to the skin tear. Past Medical History:  Diagnosis Date   Acute MI ([place]) 1999; 2007   Anemia    hx   Anginal pain ([place])    Anxiety    ARF (acute renal failure) ([place]) 06/2017   [facility] Kid[facility] Asso   Arthritis    "generalized" (03/15/2014)   CAD (coronary artery disease)    MI in 2000 - MI  2007 - treated bare metal stent (no nuclear since then as 9/11)   Carotid artery disease (H[facility])    CHF (congestive heart failure) ([place])    Chronic diastolic heart failure ([place])    a) ECHO (08/2013) EF 55-60% and RV function nl b) RHC (08/2013) RA 4, RV 30/5/7, PA 25/10 (16), PCWP 7, Fick CO/CI 6.3/2.7, PVR 1.5 WU, PA 61 and 66%   Daily headache    "~ every other day; since I fell in June" (03/15/2014)   Depression    Dyslipidemia    Dyspnea    Exertional shortness of breath    HTN (hypertension)    Hypothyroidism    Neuropathy    Obesity    Osteoarthritis    Peripheral neuropathy    PONV (postoperative nausea and vomiting)    RBBB (right bundle branch block)    Old   Stroke ([place])    mini strokes   Syncope    likely due to low blood sugar   Tachycardia    Sinus tachycardia   Type II diabetes mellitus ([place])    Type II   Urinary incontinence    Venous insufficiency    Past Surgical History:  Procedure Laterality Date   ABDOMINAL HYSTERECTOMY  1980's   AMPUTATION Right 02/24/2018    Procedure: RIGHT FOOT GREAT TOE AND 2ND TOE AMPUTATION;  Surgeon: Newt Minion, MD;  Location: [place];  Service: Orthopedics;  Laterality: Right;   AMPUTATION Right 04/30/2018   Procedure: RIGHT TRANSMETATARSAL AMPUTATION;  Surgeon: Newt Minion, MD;  Location: [place];  Service: Orthopedics;  Laterality: Right;   BIOPSY  05/27/2020   Procedure: BIOPSY;  Surgeon: [name], DO;  [facility];  Service: Endoscopy;;   CATARACT EXTRACTION, BILATERAL Bilateral ?2013   COLONOSCOPY W/ POLYPECTOMY     COLONOSCOPY WITH PROPOFOL N/A 03/13/2019   Procedure: COLONOSCOPY WITH PROPOFOL;  Surgeon: Jerene Bears, MD;  Location: [place];  Service: Gastroenterology;  Laterality: N/A;   [place]; 2007   "1 + 1"   ESOPHAGOGASTRODUODENOSCOPY (EGD) WITH PROPOFOL N/A 03/13/2019   Procedure: ESOPHAGOGASTRODUODENOSCOPY (EGD) WITH PROPOFOL;  Surgeon: Jerene Bears, MD;  Location: [place];  Service: Gastroenterology;  Laterality: N/A;   ESOPHAGOGASTRODUODENOSCOPY (EGD) WITH PROPOFOL N/A 05/27/2020   Procedure: ESOPHAGOGASTRODUODENOSCOPY (EGD) WITH PROPOFOL;  Surgeon: Hurshel Keys  K, DO;  [facility];  Service: Endoscopy;  Laterality: N/A;   EYE SURGERY Bilateral    lazer   HEMOSTASIS CLIP PLACEMENT  03/13/2019   Procedure: HEMOSTASIS CLIP PLACEMENT;  Surgeon: Jerene Bears, MD;  Location[facility] ENDOSCOPY;  Service: Gastroenterology;;   KNEE ARTHROSCOPY Left 10/25/2006   POLYPECTOMY  03/13/2019   Procedure: POLYPECTOMY;  Surgeon: Jerene Bears, MD;  Location: [place];  Service: Gastroenterology;;   RIGHT HEART CATH N/A 07/24/2017   Procedure: RIGHT HEART CATH;  Surgeon: Jolaine Artist, MD;  Location: [facility];  Service: Cardiovascular;  Laterality: N/A;   RIGHT HEART CATHETERIZATION N/A 09/22/2013   Procedure: RIGHT HEART CATH;  Surgeon: Jolaine Artist, MD;  Location: [facility];  Service: Cardiovascular;  Laterality:  N/A;   SHOULDER ARTHROSCOPY WITH OPEN ROTATOR CUFF REPAIR Right 03/14/2014   Procedure: RIGHT SHOULDER ARTHROSCOPY WITH BICEPS RELEASE, OPEN SUBSCAPULA REPAIR, OPEN SUPRASPINATUS REPAIR.;  Surgeon: Meredith Pel, MD;  Location: [place];  Service: Orthopedics;  Laterality: Right;   TOE AMPUTATION Right 02/24/2018   GREAT TOE AND 2ND TOE AMPUTATION   TUBAL LIGATION  1970's   Current Outpatient Medications on File Prior to Visit  Medication Sig Dispense Refill   acetaminophen (TYLENOL) 500 MG tablet Take 1,000 mg by mouth every 6 (six) hours as needed for moderate pain or headache.     albuterol (VENTOLIN HFA) 108 (90 Base) MCG/ACT inhaler TAKE 2 PUFFS BY MOUTH EVERY 6 HOURS AS NEEDED FOR WHEEZE OR SHORTNESS OF BREATH 8.5 g 1   allopurinol (ZYLOPRIM) 100 MG tablet Take 0.5 tablets (50 mg total) by mouth daily. 30 tablet 0   Ascorbic Acid (VITAMIN C) 1000 MG tablet Take 1,000 mg by mouth daily.     aspirin EC 81 MG tablet Take 1 tablet (81 mg total) by mouth daily with breakfast. 30 tablet 11   atorvastatin (LIPITOR) 80 MG tablet TAKE 1 TABLET BY MOUTH EVERY DAY 90 tablet 1   carvedilol (COREG) 12.5 MG tablet TAKE 1 TABLET (12.'5MG'$  TOTAL) BY MOUTH TWICE A DAY WITH A MEAL 180 tablet 1   cholecalciferol (VITAMIN D) 1000 units tablet Take 1,000 Units by mouth daily with supper.      clopidogrel (PLAVIX) 75 MG tablet TAKE 1 TABLET BY MOUTH EVERY DAY WITH BREAKFAST 90 tablet 3   Continuous Blood Gluc Receiver (FREESTYLE LIBRE 2 READER) DEVI See admin instructions.     Continuous Blood Gluc Sensor (FREESTYLE LIBRE 2 SENSOR) MISC See admin instructions.     Cranberry 250 MG TABS Take 500 mg by mouth daily.     diphenoxylate-atropine (LOMOTIL) 2.5-0.025 MG tablet Take 1 tablet by mouth 4 (four) times daily as needed for diarrhea or loose stools. 30 tablet 0   doxycycline (VIBRA-TABS) 100 MG tablet Take 1 tablet (100 mg total) by mouth 2 (two) times daily. 14 tablet 0   ferrous sulfate 325 (65 FE) MG  tablet Take 1 tablet (325 mg total) by mouth daily with breakfast. Reported on 08/21/2015 60 tablet 2   guaiFENesin-dextromethorphan (ROBITUSSIN DM) 100-10 MG/5ML syrup Take 10 mLs by mouth every 4 (four) hours as needed for cough. 118 mL 0   hydrocortisone cream 0.5 % Apply 1 application topically 2 (two) times daily. 30 g 0   Insulin Disposable Pump (OMNIPOD DASH 5 PACK PODS) MISC Inject into the skin See admin instructions. Use continuously with Novolin R - change every 72 hours.     insulin NPH  Human (HUMULIN N,NOVOLIN N) 100 UNIT/ML injection Inject 15 Units into the skin See admin instructions. Inject 15 units subcutaneously in the morning if CBG >300;     insulin regular (NOVOLIN R) 100 units/mL injection Inject into the skin See admin instructions. Manually add bolus to continuous dose via OmniPod 3 times daily per sliding scale (CBG 80-150 7 units, 151-200 9 units, 201-250 12 units, 251-300 14 units, 301- 400 17 units)     levothyroxine (SYNTHROID, LEVOTHROID) 50 MCG tablet Take 1 tablet (50 mcg total) by mouth daily before breakfast. 90 tablet 1   magnesium oxide (MAG-OX) 400 (240 Mg) MG tablet Take 1 tablet (400 mg total) by mouth daily. 90 tablet 3   Multiple Vitamins-Minerals (AIRBORNE GUMMIES PO) Take 2 tablets by mouth every evening.     Multiple Vitamins-Minerals (CENTRUM SILVER 50+WOMEN) TABS Take 1 tablet by mouth every evening.     nitroGLYCERIN (NITROSTAT) 0.4 MG SL tablet PLACE 1 TABLET (0.4 MG TOTAL) UNDER THE TONGUE EVERY 5 (FIVE) MINUTES AS NEEDED FOR CHEST PAIN. 25 tablet 1   ONETOUCH VERIO test strip 3 (three) times daily.     polyethylene glycol (MIRALAX / GLYCOLAX) 17 g packet 1 packet mixed with 8 ounces of fluid     potassium chloride SA (KLOR-CON M20) 20 MEQ tablet Take 1 tablet (20 mEq total) by mouth 2 (two) times a week. Monday and Friday 30 tablet 1   pregabalin (LYRICA) 150 MG capsule Take 150 mg by mouth 2 (two) times daily.     sitaGLIPtin (JANUVIA) 25 MG tablet  Take 1 tablet (25 mg total) by mouth daily. 90 tablet 3   torsemide (DEMADEX) 20 MG tablet Take 80 mg by mouth 2 (two) times daily.     TRINTELLIX 10 MG TABS tablet TAKE 1 TABLET BY MOUTH EVERY DAY 30 tablet 5   ursodiol (ACTIGALL) 500 MG tablet Take 500 mg by mouth 3 (three) times daily.     vitamin B-12 (CYANOCOBALAMIN) 1000 MCG tablet Take 1,000 mcg by mouth daily.     zinc gluconate 50 MG tablet Take 50 mg by mouth daily.     isosorbide mononitrate (IMDUR) 30 MG 24 hr tablet Take 0.5 tablets (15 mg total) by mouth daily. (Patient not taking: Reported on 12/10/2021) 30 tablet 6   metolazone (ZAROXOLYN) 2.5 MG tablet Take 1 tablet (2.5 mg total) by mouth daily. 90 tablet 3   No current facility-administered medications on file prior to visit.   Allergies  Allergen Reactions   Drug Ingredient [Cephalexin] Diarrhea    Pt reports heavy diarrhea with use of keflex   Codeine Nausea And Vomiting and Other (See Comments)   Social History   Socioeconomic History   Marital status: Married    Spouse name: Percell Miller   Number of children: 3   Years of education: 12th   Highest education level: Not on file  Occupational History    Employer: UNEMPLOYED  Tobacco Use   Smoking status: Former    Packs/day: 3.00    Years: 32.00    Total pack years: 96.00    Types: Cigarettes    Quit date: 10/24/1997    Years since quitting: 24.1   Smokeless tobacco: Never  Vaping Use   Vaping Use: Never used  Substance and Sexual Activity   Alcohol use: Not Currently    Comment: "might have 2-3 daiquiris in the summer"   Drug use: No   Sexual activity: Not Currently  Other Topics Concern  Not on file  Social History Narrative   Pt lives at home with her spouse.Caffeine Use- 3 sodas daily.   Social Determinants of Health   Financial Resource Strain: Low Risk  (08/16/2021)   Overall Financial Resource Strain (CARDIA)    Difficulty of Paying Living Expenses: Not hard at all  Food Insecurity: No Food  Insecurity (08/16/2021)   Hunger Vital Sign    Worried About Running Out of Food in the Last Year: Never true    Ran Out of Food in the Last Year: Never true  Transportation Needs: No Transportation Needs (08/16/2021)   PRAPARE - Hydrologist (Medical): No    Lack of Transportation (Non-Medical): No  Physical Activity: Inactive (08/16/2021)   Exercise Vital Sign    Days of Exercise per Week: 0 days    Minutes of Exercise per Session: 0 min  Stress: No Stress Concern Present (08/16/2021)   [place]    Feeling of Stress : Not at all  Social Connections: Moderately Isolated (08/16/2021)   Social Connection and Isolation Panel [NHANES]    Frequency of Communication with Friends and Family: More than three times a week    Frequency of Social Gatherings with Friends and Family: More than three times a week    Attends Religious Services: Never    Marine scientist or Organizations: No    Attends Archivist Meetings: Never    Marital Status: Married  Human resources officer Violence: Not At Risk (08/16/2021)   Humiliation, Afraid, Rape, and Kick questionnaire    Fear of Current or Ex-Partner: No    Emotionally Abused: No    Physically Abused: No    Sexually Abused: No     Review of Systems     Objective:   Physical Exam Constitutional:      General: She is not in acute distress.    Appearance: Normal appearance. She is obese. She is not ill-appearing or toxic-appearing.  Cardiovascular:     Rate and Rhythm: Normal rate and regular rhythm.     Heart sounds: Normal heart sounds. No murmur heard.    No friction rub. No gallop.  Pulmonary:     Effort: Pulmonary effort is normal. No respiratory distress.     Breath sounds: Normal breath sounds. No stridor. No wheezing, rhonchi or rales.  Abdominal:     General: Bowel sounds are normal.     Palpations: Abdomen is soft.     Tenderness:  There is no abdominal tenderness.  Musculoskeletal:     Right lower leg: No edema.     Left lower leg: Edema present.  Skin:    General: Skin is warm.     Findings: Lesion present. No erythema.  Neurological:     Mental Status: She is alert.         Assessment & Plan:  Leg swelling I put Polysporin on nonadherent gauze and use that to cover the skin tear on her skin tear over the left anterior shin.  I then placed the left leg giving Unaboot.  I would like to see the patient back on Friday to recheck the wound to ensure that the wound is healing and that the swelling is improving and that she is not developing cellulitis given her uncontrolled diabetes.  I also encouraged her to contact her endocrinologist give her an update on her sugars which she states are so high that she cannot  read them.

## 2021-12-10 NOTE — Patient Instructions (Signed)
RESTART Imdur 15 mg (one half tab) daily RESTART Metolazone 2.5 mg one tab every other week with an additional 20 meq of potassium  Labs needed in 2 weeks  Your physician wants you to follow-up in: 4 months with Dr Haroldine Laws You will receive a reminder letter in the mail two months in advance. If you don't receive a letter, please call our office to schedule the follow-up appointment.   Do the following things EVERYDAY: Weigh yourself in the morning before breakfast. Write it down and keep it in a log. Take your medicines as prescribed Eat low salt foods--Limit salt (sodium) to 2000 mg per day.  Stay as active as you can everyday Limit all fluids for the day to less than 2 liters  At the [facility], you and your health needs are our priority. As part of our continuing mission to provide you with exceptional heart care, we have created designated Provider Care Teams. These Care Teams include your primary Cardiologist (physician) and Advanced Practice Providers (APPs- Physician Assistants and Nurse Practitioners) who all work together to provide you with the care you need, when you need it.   You may see any of the following providers on your designated Care Team at your next follow up: Dr Glori Bickers Dr Haynes Kerns, NP Lyda Jester, [place] [facility] [place], [place] Audry Riles, PharmD   Please be sure to bring in all your medications bottles to every appointment.   If you have any questions or concerns before your next appointment please send [name] a message through [place] or call our office at [phone].    TO LEAVE A MESSAGE FOR THE NURSE SELECT OPTION 2, PLEASE LEAVE A MESSAGE INCLUDING: YOUR NAME DATE OF BIRTH CALL BACK NUMBER REASON FOR CALL**this is important as we prioritize the call backs  YOU WILL RECEIVE A CALL BACK THE SAME DAY AS LONG AS YOU CALL BEFORE 4:00 PM

## 2021-12-11 LAB — ECHOCARDIOGRAM COMPLETE
Area-P 1/2: 5.54 cm2
Calc EF: 24.6 %
Height: 66 in
MV VTI: 1.95 cm2
S' Lateral: 4.8 cm
Single Plane A2C EF: 25.5 %
Single Plane A4C EF: 25.5 %
Weight: 3856 oz

## 2021-12-13 ENCOUNTER — Ambulatory Visit (INDEPENDENT_AMBULATORY_CARE_PROVIDER_SITE_OTHER): Payer: PPO | Admitting: Family Medicine

## 2021-12-13 VITALS — BP 126/62 | HR 86 | Temp 97.9°F | Wt 246.0 lb

## 2021-12-13 DIAGNOSIS — M7989 Other specified soft tissue disorders: Secondary | ICD-10-CM

## 2021-12-13 DIAGNOSIS — I83029 Varicose veins of left lower extremity with ulcer of unspecified site: Secondary | ICD-10-CM | POA: Diagnosis not present

## 2021-12-13 DIAGNOSIS — L97929 Non-pressure chronic ulcer of unspecified part of left lower leg with unspecified severity: Secondary | ICD-10-CM | POA: Diagnosis not present

## 2021-12-13 NOTE — Progress Notes (Signed)
Subjective:    Patient ID: Susan [name], female    DOB: 05-May-1951, 71 y.o.   MRN: [id number] 12/10/21 Patient states that her left leg has been swollen significantly for about 2 weeks.  She suffered a skin tear to the lateral aspect of the left anterior skin about a week ago.  This is more centimeters long and 1 cm wide.  It is very shallow but bleeding.  She reports burning and stinging pain in the leg.  There is +2 pitting edema all the way up to her knee.  She is wearing her compression hose on her right leg.  That leg the swelling is controlled however she is not wearing compression hose on the left leg due to the skin tear.  At that time, my plan was:  I put Polysporin on nonadherent gauze and use that to cover the skin tear on her skin tear over the left anterior shin.  I then placed the left leg giving Unaboot.  I would like to see the patient back on Friday to recheck the wound to ensure that the wound is healing and that the swelling is improving and that she is not developing cellulitis given her uncontrolled diabetes.  I also encouraged her to contact her endocrinologist give her an update on her sugars which she states are so high that she cannot read them.  12/13/21 Swelling is down dramatically in the left leg.  After removing the Unna boot there are 4 locations of bleeding.  There is a nickel sized abrasion on the medial calf that is oozing blood.  This appears to be where dry skin was removed as the Unna boot was taken off.  There are 2 small 6 mm ulcers on the anterior right shin that are oozing blood.  There is the long skin tear on the lateral shin that was mentioned at the last visit.  This is not bleeding but has not healed.  The edema has resolved below the [employer].  There is pitting edema above the [employer].  The patient denies any chest pain or shortness of breath or dyspnea on exertion Past Medical History:  Diagnosis Date   Acute MI ([place]) 1999; 2007   Anemia    hx    Anginal pain ([place])    Anxiety    ARF (acute renal failure) ([place]) 06/2017   [facility]   Arthritis    "generalized" (03/15/2014)   CAD (coronary artery disease)    MI in 2000 - MI  2007 - treated bare metal stent (no nuclear since then as 9/11)   Carotid artery disease (HCC)    CHF (congestive heart failure) ([place])    Chronic diastolic heart failure ([place])    a) ECHO (08/2013) EF 55-60% and RV function nl b) RHC (08/2013) RA 4, RV 30/5/7, PA 25/10 (16), PCWP 7, Fick CO/CI 6.3/2.7, PVR 1.5 WU, PA 61 and 66%   Daily headache    "~ every other day; since I fell in June" (03/15/2014)   Depression    Dyslipidemia    Dyspnea    Exertional shortness of breath    HTN (hypertension)    Hypothyroidism    Neuropathy    Obesity    Osteoarthritis    Peripheral neuropathy    PONV (postoperative nausea and vomiting)    RBBB (right bundle branch block)    Old   Stroke ([place])    mini strokes   Syncope    likely due to  low blood sugar   Tachycardia    Sinus tachycardia   Type II diabetes mellitus (HCC)    Type II   Urinary incontinence    Venous insufficiency    Past Surgical History:  Procedure Laterality Date   ABDOMINAL HYSTERECTOMY  1980's   AMPUTATION Right 02/24/2018   Procedure: RIGHT FOOT GREAT TOE AND 2ND TOE AMPUTATION;  Surgeon: Newt Minion, MD;  Location: [facility];  Service: Orthopedics;  Laterality: Right;   AMPUTATION Right 04/30/2018   Procedure: RIGHT TRANSMETATARSAL AMPUTATION;  Surgeon: Newt Minion, MD;  Location: [facility];  Service: Orthopedics;  Laterality: Right;   BIOPSY  05/27/2020   Procedure: BIOPSY;  Surgeon: [name], DO;  [facility];  Service: Endoscopy;;   CATARACT EXTRACTION, BILATERAL Bilateral ?2013   COLONOSCOPY W/ POLYPECTOMY     COLONOSCOPY WITH PROPOFOL N/A 03/13/2019   Procedure: COLONOSCOPY WITH PROPOFOL;  Surgeon: Jerene Bears, MD;  Location: [facility];  Service: Gastroenterology;  Laterality: N/A;   [place]; 2007   "1 + 1"   ESOPHAGOGASTRODUODENOSCOPY (EGD) WITH PROPOFOL N/A 03/13/2019   Procedure: ESOPHAGOGASTRODUODENOSCOPY (EGD) WITH PROPOFOL;  Surgeon: Jerene Bears, MD;  Location: [facility];  Service: Gastroenterology;  Laterality: N/A;   ESOPHAGOGASTRODUODENOSCOPY (EGD) WITH PROPOFOL N/A 05/27/2020   Procedure: ESOPHAGOGASTRODUODENOSCOPY (EGD) WITH PROPOFOL;  Surgeon: [name], DO;  [facility];  Service: Endoscopy;  Laterality: N/A;   EYE SURGERY Bilateral    lazer   HEMOSTASIS CLIP PLACEMENT  03/13/2019   Procedure: HEMOSTASIS CLIP PLACEMENT;  Surgeon: Jerene Bears, MD;  Location[facility] ENDOSCOPY;  Service: Gastroenterology;;   KNEE ARTHROSCOPY Left 10/25/2006   POLYPECTOMY  03/13/2019   Procedure: POLYPECTOMY;  Surgeon: Jerene Bears, MD;  Location: [facility];  Service: Gastroenterology;;   RIGHT HEART CATH N/A 07/24/2017   Procedure: RIGHT HEART CATH;  Surgeon: Jolaine Artist, MD;  Location: [facility];  Service: Cardiovascular;  Laterality: N/A;   RIGHT HEART CATHETERIZATION N/A 09/22/2013   Procedure: RIGHT HEART CATH;  Surgeon: Jolaine Artist, MD;  [facility];  Service: Cardiovascular;  Laterality: N/A;   SHOULDER ARTHROSCOPY WITH OPEN ROTATOR CUFF REPAIR Right 03/14/2014   Procedure: RIGHT SHOULDER ARTHROSCOPY WITH BICEPS RELEASE, OPEN SUBSCAPULA REPAIR, OPEN SUPRASPINATUS REPAIR.;  Surgeon: Meredith Pel, MD;  Location: [facility];  Service: Orthopedics;  Laterality: Right;   TOE AMPUTATION Right 02/24/2018   GREAT TOE AND 2ND TOE AMPUTATION   TUBAL LIGATION  1970's   Current Outpatient Medications on File Prior to Visit  Medication Sig Dispense Refill   acetaminophen (TYLENOL) 500 MG tablet Take 1,000 mg by mouth every 6 (six) hours as needed for moderate pain or headache.     albuterol (VENTOLIN HFA) 108 (90 Base) MCG/ACT inhaler TAKE 2 PUFFS BY MOUTH EVERY 6 HOURS AS NEEDED FOR WHEEZE OR  SHORTNESS OF BREATH 8.5 g 1   Ascorbic Acid (VITAMIN C) 1000 MG tablet Take 1,000 mg by mouth daily.     aspirin EC 81 MG tablet Take 1 tablet (81 mg total) by mouth daily with breakfast. 30 tablet 11   atorvastatin (LIPITOR) 80 MG tablet TAKE 1 TABLET BY MOUTH EVERY DAY 90 tablet 1   carvedilol (COREG) 12.5 MG tablet TAKE 1 TABLET (12.'5MG'$  TOTAL) BY MOUTH TWICE A DAY WITH A MEAL 180 tablet 1   cholecalciferol (VITAMIN D) 1000 units tablet Take 1,000 Units by mouth daily  with supper.      clopidogrel (PLAVIX) 75 MG tablet TAKE 1 TABLET BY MOUTH EVERY DAY WITH BREAKFAST 90 tablet 3   Continuous Blood Gluc Receiver (FREESTYLE LIBRE 2 READER) DEVI See admin instructions.     Cranberry 250 MG TABS Take 500 mg by mouth daily.     diphenoxylate-atropine (LOMOTIL) 2.5-0.025 MG tablet Take 1 tablet by mouth 4 (four) times daily as needed for diarrhea or loose stools. 30 tablet 0   FERROUS SULFATE PO Take 325 mg by mouth 2 (two) times daily.     guaiFENesin-dextromethorphan (ROBITUSSIN DM) 100-10 MG/5ML syrup Take 10 mLs by mouth every 4 (four) hours as needed for cough. 118 mL 0   hydrocortisone cream 0.5 % Apply 1 application topically 2 (two) times daily. (Patient taking differently: Apply 1 application  topically 2 (two) times daily. As needed) 30 g 0   Insulin Disposable Pump (OMNIPOD DASH 5 PACK PODS) MISC Inject into the skin See admin instructions. Use continuously with Novolin R - change every 72 hours.     insulin NPH Human (HUMULIN N,NOVOLIN N) 100 UNIT/ML injection Inject 15 Units into the skin See admin instructions. Inject 15 units subcutaneously in the morning if CBG >300;     insulin regular (NOVOLIN R) 100 units/mL injection Inject into the skin See admin instructions. Manually add bolus to continuous dose via OmniPod 3 times daily per sliding scale (CBG 80-150 7 units, 151-200 9 units, 201-250 12 units, 251-300 14 units, 301- 400 17 units)     isosorbide mononitrate (IMDUR) 30 MG 24 hr tablet  Take 0.5 tablets (15 mg total) by mouth daily. 45 tablet 3   levothyroxine (SYNTHROID, LEVOTHROID) 50 MCG tablet Take 1 tablet (50 mcg total) by mouth daily before breakfast. 90 tablet 1   magnesium oxide (MAG-OX) 400 (240 Mg) MG tablet Take 1 tablet (400 mg total) by mouth daily. 90 tablet 3   metolazone (ZAROXOLYN) 2.5 MG tablet Take 1 tablet (2.5 mg total) by mouth as directed. Every other week 2 tablet 3   Multiple Vitamins-Minerals (AIRBORNE GUMMIES PO) Take 2 tablets by mouth every evening.     Multiple Vitamins-Minerals (CENTRUM SILVER 50+WOMEN) TABS Take 1 tablet by mouth every evening.     nitroGLYCERIN (NITROSTAT) 0.4 MG SL tablet PLACE 1 TABLET (0.4 MG TOTAL) UNDER THE TONGUE EVERY 5 (FIVE) MINUTES AS NEEDED FOR CHEST PAIN. 25 tablet 1   ONETOUCH VERIO test strip 3 (three) times daily.     polyethylene glycol (MIRALAX / GLYCOLAX) 17 g packet Take 17 g by mouth as needed for mild constipation.     pregabalin (LYRICA) 150 MG capsule Take 150 mg by mouth 2 (two) times daily.     sitaGLIPtin (JANUVIA) 25 MG tablet Take 1 tablet (25 mg total) by mouth daily. 90 tablet 3   torsemide (DEMADEX) 20 MG tablet Take 4 tablets (80 mg total) by mouth 2 (two) times daily. 240 tablet 3   TRINTELLIX 10 MG TABS tablet TAKE 1 TABLET BY MOUTH EVERY DAY 30 tablet 5   ursodiol (ACTIGALL) 500 MG tablet Take 500 mg by mouth 3 (three) times daily.     vitamin B-12 (CYANOCOBALAMIN) 1000 MCG tablet Take 1,000 mcg by mouth daily.     zinc gluconate 50 MG tablet Take 50 mg by mouth daily.     No current facility-administered medications on file prior to visit.   Allergies  Allergen Reactions   Drug Ingredient [Cephalexin] Diarrhea    Pt  reports heavy diarrhea with use of keflex   Codeine Nausea And Vomiting and Other (See Comments)   Social History   Socioeconomic History   Marital status: Married    Spouse name: Percell Miller   Number of children: 3   Years of education: 12th   Highest education level: Not  on file  Occupational History    Employer: UNEMPLOYED  Tobacco Use   Smoking status: Former    Packs/day: 3.00    Years: 32.00    Total pack years: 96.00    Types: Cigarettes    Quit date: 10/24/1997    Years since quitting: 24.1   Smokeless tobacco: Never  Vaping Use   Vaping Use: Never used  Substance and Sexual Activity   Alcohol use: Not Currently    Comment: "might have 2-3 daiquiris in the summer"   Drug use: No   Sexual activity: Not Currently  Other Topics Concern   Not on file  Social History Narrative   Pt lives at home with her spouse.Caffeine Use- 3 sodas daily.   Social Determinants of Health   Financial Resource Strain: Low Risk  (08/16/2021)   Overall Financial Resource Strain (CARDIA)    Difficulty of Paying Living Expenses: Not hard at all  Food Insecurity: No Food Insecurity (08/16/2021)   Hunger Vital Sign    Worried About Running Out of Food in the Last Year: Never true    Ran Out of Food in the Last Year: Never true  Transportation Needs: No Transportation Needs (08/16/2021)   PRAPARE - Hydrologist (Medical): No    Lack of Transportation (Non-Medical): No  Physical Activity: Inactive (08/16/2021)   Exercise Vital Sign    Days of Exercise per Week: 0 days    Minutes of Exercise per Session: 0 min  Stress: No Stress Concern Present (08/16/2021)   [place]    Feeling of Stress : Not at all  Social Connections: Moderately Isolated (08/16/2021)   Social Connection and Isolation Panel [NHANES]    Frequency of Communication with Friends and Family: More than three times a week    Frequency of Social Gatherings with Friends and Family: More than three times a week    Attends Religious Services: Never    Marine scientist or Organizations: No    Attends Archivist Meetings: Never    Marital Status: Married  Human resources officer Violence: Not At Risk  (08/16/2021)   Humiliation, Afraid, Rape, and Kick questionnaire    Fear of Current or Ex-Partner: No    Emotionally Abused: No    Physically Abused: No    Sexually Abused: No     Review of Systems     Objective:   Physical Exam Constitutional:      General: She is not in acute distress.    Appearance: Normal appearance. She is obese. She is not ill-appearing or toxic-appearing.  Cardiovascular:     Rate and Rhythm: Normal rate and regular rhythm.     Heart sounds: Normal heart sounds. No murmur heard.    No friction rub. No gallop.  Pulmonary:     Effort: Pulmonary effort is normal. No respiratory distress.     Breath sounds: Normal breath sounds. No stridor. No wheezing, rhonchi or rales.  Abdominal:     General: Bowel sounds are normal.     Palpations: Abdomen is soft.     Tenderness: There is no abdominal  tenderness.  Musculoskeletal:     Right lower leg: No edema.     Left lower leg: Laceration present. 1+ Pitting Edema present.       Legs:  Skin:    General: Skin is warm.     Findings: Lesion present. No erythema.  Neurological:     Mental Status: She is alert.         Assessment & Plan:  Leg swelling  Venous ulcer of left leg ([place]) Thankfully there is no evidence of cellulitis.  Each of the 4 lesions above was covered with Silvadene and nonadherent gauze.  The patient was then replaced in an Unna boot which I plan to take off the first of next week.  As the wounds gradually heal, I will transition the patient back in compression stockings.

## 2021-12-15 DIAGNOSIS — I5032 Chronic diastolic (congestive) heart failure: Secondary | ICD-10-CM | POA: Diagnosis not present

## 2021-12-16 ENCOUNTER — Ambulatory Visit (INDEPENDENT_AMBULATORY_CARE_PROVIDER_SITE_OTHER): Payer: HMO | Admitting: Family Medicine

## 2021-12-16 VITALS — BP 138/72 | HR 88 | Temp 97.7°F | Ht 66.0 in | Wt 242.0 lb

## 2021-12-16 DIAGNOSIS — I83029 Varicose veins of left lower extremity with ulcer of unspecified site: Secondary | ICD-10-CM | POA: Diagnosis not present

## 2021-12-16 DIAGNOSIS — M7989 Other specified soft tissue disorders: Secondary | ICD-10-CM

## 2021-12-16 DIAGNOSIS — L97929 Non-pressure chronic ulcer of unspecified part of left lower leg with unspecified severity: Secondary | ICD-10-CM

## 2021-12-16 NOTE — Progress Notes (Signed)
Subjective:    Patient ID: Susan [name], female    DOB: 1950/06/03, 71 y.o.   MRN: [id number] 12/10/21 Patient states that her left leg has been swollen significantly for about 2 weeks.  She suffered a skin tear to the lateral aspect of the left anterior skin about a week ago.  This is more centimeters long and 1 cm wide.  It is very shallow but bleeding.  She reports burning and stinging pain in the leg.  There is +2 pitting edema all the way up to her knee.  She is wearing her compression hose on her right leg.  That leg the swelling is controlled however she is not wearing compression hose on the left leg due to the skin tear.  At that time, my plan was:  I put Polysporin on nonadherent gauze and use that to cover the skin tear on her skin tear over the left anterior shin.  I then placed the left leg giving Unaboot.  I would like to see the patient back on Friday to recheck the wound to ensure that the wound is healing and that the swelling is improving and that she is not developing cellulitis given her uncontrolled diabetes.  I also encouraged her to contact her endocrinologist give her an update on her sugars which she states are so high that she cannot read them.  12/13/21 Swelling is down dramatically in the left leg.  After removing the Unna boot there are 4 locations of bleeding.  There is a nickel sized abrasion on the medial calf that is oozing blood.  This appears to be where dry skin was removed as the Unna boot was taken off.  There are 2 small 6 mm ulcers on the anterior right shin that are oozing blood.  There is the long skin tear on the lateral shin that was mentioned at the last visit.  This is not bleeding but has not healed.  The edema has resolved below the [employer].  There is pitting edema above the [employer].  The patient denies any chest pain or shortness of breath or dyspnea on exertion  12/16/21 Swelling has resolved in her left leg.  Skin tear is now approximately 5 mm x  2-1/2 inches.  There is no bleeding.  The wound is healing over.  There is no other site of active bleeding.  The superficial ulcers that are described above have healed.  There is no erythema or pain Past Medical History:  Diagnosis Date   Acute MI ([place]) 1999; 2007   Anemia    hx   Anginal pain ([place])    Anxiety    ARF (acute renal failure) ([place]) 06/2017   [facility]   Arthritis    "generalized" (03/15/2014)   CAD (coronary artery disease)    MI in 2000 - MI  2007 - treated bare metal stent (no nuclear since then as 9/11)   Carotid artery disease (HCC)    CHF (congestive heart failure) (HCC)    Chronic diastolic heart failure ([place])    a) ECHO (08/2013) EF 55-60% and RV function nl b) RHC (08/2013) RA 4, RV 30/5/7, PA 25/10 (16), PCWP 7, Fick CO/CI 6.3/2.7, PVR 1.5 WU, PA 61 and 66%   Daily headache    "~ every other day; since I fell in June" (03/15/2014)   Depression    Dyslipidemia    Dyspnea    Exertional shortness of breath    HTN (hypertension)  Hypothyroidism    Neuropathy    Obesity    Osteoarthritis    Peripheral neuropathy    PONV (postoperative nausea and vomiting)    RBBB (right bundle branch block)    Old   Stroke ([place])    mini strokes   Syncope    likely due to low blood sugar   Tachycardia    Sinus tachycardia   Type II diabetes mellitus (HCC)    Type II   Urinary incontinence    Venous insufficiency    Past Surgical History:  Procedure Laterality Date   ABDOMINAL HYSTERECTOMY  1980's   AMPUTATION Right 02/24/2018   Procedure: RIGHT FOOT GREAT TOE AND 2ND TOE AMPUTATION;  Surgeon: Newt Minion, MD;  Location: [facility];  Service: Orthopedics;  Laterality: Right;   AMPUTATION Right 04/30/2018   Procedure: RIGHT TRANSMETATARSAL AMPUTATION;  Surgeon: Newt Minion, MD;  Location: [facility];  Service: Orthopedics;  Laterality: Right;   BIOPSY  05/27/2020   Procedure: BIOPSY;  Surgeon: [name], DO;  [facility];  Service: Endoscopy;;    CATARACT EXTRACTION, BILATERAL Bilateral ?2013   COLONOSCOPY W/ POLYPECTOMY     COLONOSCOPY WITH PROPOFOL N/A 03/13/2019   Procedure: COLONOSCOPY WITH PROPOFOL;  Surgeon: Jerene Bears, MD;  Location: [facility];  Service: Gastroenterology;  Laterality: N/A;   [place]; 2007   "1 + 1"   ESOPHAGOGASTRODUODENOSCOPY (EGD) WITH PROPOFOL N/A 03/13/2019   Procedure: ESOPHAGOGASTRODUODENOSCOPY (EGD) WITH PROPOFOL;  Surgeon: Jerene Bears, MD;  Location: [facility];  Service: Gastroenterology;  Laterality: N/A;   ESOPHAGOGASTRODUODENOSCOPY (EGD) WITH PROPOFOL N/A 05/27/2020   Procedure: ESOPHAGOGASTRODUODENOSCOPY (EGD) WITH PROPOFOL;  Surgeon: [name], DO;  [facility];  Service: Endoscopy;  Laterality: N/A;   EYE SURGERY Bilateral    lazer   HEMOSTASIS CLIP PLACEMENT  03/13/2019   Procedure: HEMOSTASIS CLIP PLACEMENT;  Surgeon: Jerene Bears, MD;  Location: [name] ENDOSCOPY;  Service: Gastroenterology;;   KNEE ARTHROSCOPY Left 10/25/2006   POLYPECTOMY  03/13/2019   Procedure: POLYPECTOMY;  Surgeon: Jerene Bears, MD;  Location: [place];  Service: Gastroenterology;;   RIGHT HEART CATH N/A 07/24/2017   Procedure: RIGHT HEART CATH;  Surgeon: Jolaine Artist, MD;  Location: [place] [facility];  Service: Cardiovascular;  Laterality: N/A;   RIGHT HEART CATHETERIZATION N/A 09/22/2013   Procedure: RIGHT HEART CATH;  Surgeon: Jolaine Artist, MD;  Location: [facility] CATH LAB;  Service: Cardiovascular;  Laterality: N/A;   SHOULDER ARTHROSCOPY WITH OPEN ROTATOR CUFF REPAIR Right 03/14/2014   Procedure: RIGHT SHOULDER ARTHROSCOPY WITH BICEPS RELEASE, OPEN SUBSCAPULA REPAIR, OPEN SUPRASPINATUS REPAIR.;  Surgeon: Meredith Pel, MD;  Location: [facility];  Service: Orthopedics;  Laterality: Right;   TOE AMPUTATION Right 02/24/2018   GREAT TOE AND 2ND TOE AMPUTATION   TUBAL LIGATION  1970's   Current Outpatient Medications on File Prior to Visit   Medication Sig Dispense Refill   acetaminophen (TYLENOL) 500 MG tablet Take 1,000 mg by mouth every 6 (six) hours as needed for moderate pain or headache.     albuterol (VENTOLIN HFA) 108 (90 Base) MCG/ACT inhaler TAKE 2 PUFFS BY MOUTH EVERY 6 HOURS AS NEEDED FOR WHEEZE OR SHORTNESS OF BREATH 8.5 g 1   Ascorbic Acid (VITAMIN C) 1000 MG tablet Take 1,000 mg by mouth daily.     aspirin EC 81 MG tablet Take 1 tablet (81 mg total) by mouth daily with breakfast. 30  tablet 11   atorvastatin (LIPITOR) 80 MG tablet TAKE 1 TABLET BY MOUTH EVERY DAY 90 tablet 1   carvedilol (COREG) 12.5 MG tablet TAKE 1 TABLET (12.'5MG'$  TOTAL) BY MOUTH TWICE A DAY WITH A MEAL 180 tablet 1   cholecalciferol (VITAMIN D) 1000 units tablet Take 1,000 Units by mouth daily with supper.      clopidogrel (PLAVIX) 75 MG tablet TAKE 1 TABLET BY MOUTH EVERY DAY WITH BREAKFAST 90 tablet 3   Continuous Blood Gluc Receiver (FREESTYLE LIBRE 2 READER) DEVI See admin instructions.     Cranberry 250 MG TABS Take 500 mg by mouth daily.     diphenoxylate-atropine (LOMOTIL) 2.5-0.025 MG tablet Take 1 tablet by mouth 4 (four) times daily as needed for diarrhea or loose stools. 30 tablet 0   FERROUS SULFATE PO Take 325 mg by mouth 2 (two) times daily.     guaiFENesin-dextromethorphan (ROBITUSSIN DM) 100-10 MG/5ML syrup Take 10 mLs by mouth every 4 (four) hours as needed for cough. 118 mL 0   hydrocortisone cream 0.5 % Apply 1 application topically 2 (two) times daily. (Patient taking differently: Apply 1 application  topically 2 (two) times daily. As needed) 30 g 0   Insulin Disposable Pump (OMNIPOD DASH 5 PACK PODS) MISC Inject into the skin See admin instructions. Use continuously with Novolin R - change every 72 hours.     insulin NPH Human (HUMULIN N,NOVOLIN N) 100 UNIT/ML injection Inject 15 Units into the skin See admin instructions. Inject 15 units subcutaneously in the morning if CBG >300;     insulin regular (NOVOLIN R) 100 units/mL  injection Inject into the skin See admin instructions. Manually add bolus to continuous dose via OmniPod 3 times daily per sliding scale (CBG 80-150 7 units, 151-200 9 units, 201-250 12 units, 251-300 14 units, 301- 400 17 units)     isosorbide mononitrate (IMDUR) 30 MG 24 hr tablet Take 0.5 tablets (15 mg total) by mouth daily. 45 tablet 3   levothyroxine (SYNTHROID, LEVOTHROID) 50 MCG tablet Take 1 tablet (50 mcg total) by mouth daily before breakfast. 90 tablet 1   magnesium oxide (MAG-OX) 400 (240 Mg) MG tablet Take 1 tablet (400 mg total) by mouth daily. 90 tablet 3   metolazone (ZAROXOLYN) 2.5 MG tablet Take 1 tablet (2.5 mg total) by mouth as directed. Every other week 2 tablet 3   Multiple Vitamins-Minerals (AIRBORNE GUMMIES PO) Take 2 tablets by mouth every evening.     Multiple Vitamins-Minerals (CENTRUM SILVER 50+WOMEN) TABS Take 1 tablet by mouth every evening.     nitroGLYCERIN (NITROSTAT) 0.4 MG SL tablet PLACE 1 TABLET (0.4 MG TOTAL) UNDER THE TONGUE EVERY 5 (FIVE) MINUTES AS NEEDED FOR CHEST PAIN. 25 tablet 1   ONETOUCH VERIO test strip 3 (three) times daily.     polyethylene glycol (MIRALAX / GLYCOLAX) 17 g packet Take 17 g by mouth as needed for mild constipation.     pregabalin (LYRICA) 150 MG capsule Take 150 mg by mouth 2 (two) times daily.     sitaGLIPtin (JANUVIA) 25 MG tablet Take 1 tablet (25 mg total) by mouth daily. 90 tablet 3   torsemide (DEMADEX) 20 MG tablet Take 4 tablets (80 mg total) by mouth 2 (two) times daily. 240 tablet 3   TRINTELLIX 10 MG TABS tablet TAKE 1 TABLET BY MOUTH EVERY DAY 30 tablet 5   ursodiol (ACTIGALL) 500 MG tablet Take 500 mg by mouth 3 (three) times daily.  vitamin B-12 (CYANOCOBALAMIN) 1000 MCG tablet Take 1,000 mcg by mouth daily.     zinc gluconate 50 MG tablet Take 50 mg by mouth daily.     No current facility-administered medications on file prior to visit.   Allergies  Allergen Reactions   Drug Ingredient [Cephalexin] Diarrhea     Pt reports heavy diarrhea with use of keflex   Codeine Nausea And Vomiting and Other (See Comments)   Social History   Socioeconomic History   Marital status: Married    Spouse name: Percell Miller   Number of children: 3   Years of education: 12th   Highest education level: Not on file  Occupational History    Employer: UNEMPLOYED  Tobacco Use   Smoking status: Former    Packs/day: 3.00    Years: 32.00    Total pack years: 96.00    Types: Cigarettes    Quit date: 10/24/1997    Years since quitting: 24.1   Smokeless tobacco: Never  Vaping Use   Vaping Use: Never used  Substance and Sexual Activity   Alcohol use: Not Currently    Comment: "might have 2-3 daiquiris in the summer"   Drug use: No   Sexual activity: Not Currently  Other Topics Concern   Not on file  Social History Narrative   Pt lives at home with her spouse.Caffeine Use- 3 sodas daily.   Social Determinants of Health   Financial Resource Strain: Low Risk  (08/16/2021)   Overall Financial Resource Strain (CARDIA)    Difficulty of Paying Living Expenses: Not hard at all  Food Insecurity: No Food Insecurity (08/16/2021)   Hunger Vital Sign    Worried About Running Out of Food in the Last Year: Never true    Ran Out of Food in the Last Year: Never true  Transportation Needs: No Transportation Needs (08/16/2021)   PRAPARE - Hydrologist (Medical): No    Lack of Transportation (Non-Medical): No  Physical Activity: Inactive (08/16/2021)   Exercise Vital Sign    Days of Exercise per Week: 0 days    Minutes of Exercise per Session: 0 min  Stress: No Stress Concern Present (08/16/2021)   [place]    Feeling of Stress : Not at all  Social Connections: Moderately Isolated (08/16/2021)   Social Connection and Isolation Panel [NHANES]    Frequency of Communication with Friends and Family: More than three times a week    Frequency  of Social Gatherings with Friends and Family: More than three times a week    Attends Religious Services: Never    Marine scientist or Organizations: No    Attends Archivist Meetings: Never    Marital Status: Married  Human resources officer Violence: Not At Risk (08/16/2021)   Humiliation, Afraid, Rape, and Kick questionnaire    Fear of Current or Ex-Partner: No    Emotionally Abused: No    Physically Abused: No    Sexually Abused: No     Review of Systems     Objective:   Physical Exam Constitutional:      General: She is not in acute distress.    Appearance: Normal appearance. She is obese. She is not ill-appearing or toxic-appearing.  Cardiovascular:     Rate and Rhythm: Normal rate and regular rhythm.     Heart sounds: Normal heart sounds. No murmur heard.    No friction rub. No gallop.  Pulmonary:  Effort: Pulmonary effort is normal. No respiratory distress.     Breath sounds: Normal breath sounds. No stridor. No wheezing, rhonchi or rales.  Abdominal:     General: Bowel sounds are normal.     Palpations: Abdomen is soft.     Tenderness: There is no abdominal tenderness.  Musculoskeletal:     Right lower leg: No edema.     Left lower leg: Laceration present. 1+ Pitting Edema present.       Legs:  Skin:    General: Skin is warm.     Findings: Lesion present. No erythema.  Neurological:     Mental Status: She is alert.         Assessment & Plan:  Leg swelling  Venous ulcer of left leg (HCC) Honestly the patient can switch to Band-Aids covering the wounds until they heal completely and her compression hose however she is concerned that the wounds will stick to the compression hose and that these will tear open the wounds.  This is what is happened before.  Therefore she asked me to rewrap with a new boot 1 additional time.  I plan to remove that on Friday and on Friday have asked her to bring her compression hose so we can transition to this.  I will  demonstrate for her how to apply bandages to prevent the compression hose from sticking to the wounds.

## 2021-12-18 DIAGNOSIS — H43813 Vitreous degeneration, bilateral: Secondary | ICD-10-CM | POA: Diagnosis not present

## 2021-12-18 DIAGNOSIS — E113511 Type 2 diabetes mellitus with proliferative diabetic retinopathy with macular edema, right eye: Secondary | ICD-10-CM | POA: Diagnosis not present

## 2021-12-18 DIAGNOSIS — E113512 Type 2 diabetes mellitus with proliferative diabetic retinopathy with macular edema, left eye: Secondary | ICD-10-CM | POA: Diagnosis not present

## 2021-12-18 DIAGNOSIS — H3582 Retinal ischemia: Secondary | ICD-10-CM | POA: Diagnosis not present

## 2021-12-18 DIAGNOSIS — E113513 Type 2 diabetes mellitus with proliferative diabetic retinopathy with macular edema, bilateral: Secondary | ICD-10-CM | POA: Diagnosis not present

## 2021-12-18 DIAGNOSIS — H43821 Vitreomacular adhesion, right eye: Secondary | ICD-10-CM | POA: Diagnosis not present

## 2021-12-19 ENCOUNTER — Telehealth: Payer: HMO

## 2021-12-20 ENCOUNTER — Ambulatory Visit (INDEPENDENT_AMBULATORY_CARE_PROVIDER_SITE_OTHER): Payer: HMO | Admitting: Family Medicine

## 2021-12-20 VITALS — BP 128/68 | HR 79 | Temp 98.0°F | Ht 66.0 in | Wt 239.0 lb

## 2021-12-20 DIAGNOSIS — I83029 Varicose veins of left lower extremity with ulcer of unspecified site: Secondary | ICD-10-CM | POA: Diagnosis not present

## 2021-12-20 DIAGNOSIS — L97929 Non-pressure chronic ulcer of unspecified part of left lower leg with unspecified severity: Secondary | ICD-10-CM | POA: Diagnosis not present

## 2021-12-20 NOTE — Progress Notes (Signed)
Subjective:    Patient ID: Susan [name], female    DOB: 1950/09/27, 71 y.o.   MRN: [id number] 12/10/21 Patient states that her left leg has been swollen significantly for about 2 weeks.  She suffered a skin tear to the lateral aspect of the left anterior skin about a week ago.  This is more centimeters long and 1 cm wide.  It is very shallow but bleeding.  She reports burning and stinging pain in the leg.  There is +2 pitting edema all the way up to her knee.  She is wearing her compression hose on her right leg.  That leg the swelling is controlled however she is not wearing compression hose on the left leg due to the skin tear.  At that time, my plan was:  I put Polysporin on nonadherent gauze and use that to cover the skin tear on her skin tear over the left anterior shin.  I then placed the left leg giving Unaboot.  I would like to see the patient back on Friday to recheck the wound to ensure that the wound is healing and that the swelling is improving and that she is not developing cellulitis given her uncontrolled diabetes.  I also encouraged her to contact her endocrinologist give her an update on her sugars which she states are so high that she cannot read them.  12/13/21 Swelling is down dramatically in the left leg.  After removing the Unna boot there are 4 locations of bleeding.  There is a nickel sized abrasion on the medial calf that is oozing blood.  This appears to be where dry skin was removed as the Unna boot was taken off.  There are 2 small 6 mm ulcers on the anterior right shin that are oozing blood.  There is the long skin tear on the lateral shin that was mentioned at the last visit.  This is not bleeding but has not healed.  The edema has resolved below the [employer].  There is pitting edema above the [employer].  The patient denies any chest pain or shortness of breath or dyspnea on exertion  12/16/21 Swelling has resolved in her left leg.  Skin tear is now approximately 5 mm x  2-1/2 inches.  There is no bleeding.  The wound is healing over.  There is no other site of active bleeding.  The superficial ulcers that are described above have healed.  There is no erythema or pain.  At that time, my plan was:  Honestly the patient can switch to Band-Aids covering the wounds until they heal completely and her compression hose however she is concerned that the wounds will stick to the compression hose and that these will tear open the wounds.  This is what is happened before.  Therefore she asked me to rewrap with a new boot 1 additional time.  I plan to remove that on Friday and on Friday have asked her to bring her compression hose so we can transition to this.  I will demonstrate for her how to apply bandages to prevent the compression hose from sticking to the wounds.  12/20/21 Patient is here today for follow-up.  All of the superficial wounds on her leg except for the skin tear on the left superior lateral shin have healed.  There are no weeping ulcers.  There is no bleeding.  The skin tear would likely require another 7 to 10 days to heal.  The swelling has completely resolved in her  left leg due to the [employer] Past Medical History:  Diagnosis Date   Acute MI ([place]) 1999; 2007   Anemia    hx   Anginal pain ([place])    Anxiety    ARF (acute renal failure) ([place]) 06/2017   [facility]   Arthritis    "generalized" (03/15/2014)   CAD (coronary artery disease)    MI in 2000 - MI  2007 - treated bare metal stent (no nuclear since then as 9/11)   Carotid artery disease (H[facility])    CHF (congestive heart failure) ([place])    Chronic diastolic heart failure ([place])    a) ECHO (08/2013) EF 55-60% and RV function nl b) RHC (08/2013) RA 4, RV 30/5/7, PA 25/10 (16), PCWP 7, Fick CO/CI 6.3/2.7, PVR 1.5 WU, PA 61 and 66%   Daily headache    "~ every other day; since I fell in June" (03/15/2014)   Depression    Dyslipidemia    Dyspnea    Exertional shortness of breath    HTN  (hypertension)    Hypothyroidism    Neuropathy    Obesity    Osteoarthritis    Peripheral neuropathy    PONV (postoperative nausea and vomiting)    RBBB (right bundle branch block)    Old   Stroke ([place])    mini strokes   Syncope    likely due to low blood sugar   Tachycardia    Sinus tachycardia   Type II diabetes mellitus ([place])    Type II   Urinary incontinence    Venous insufficiency    Past Surgical History:  Procedure Laterality Date   ABDOMINAL HYSTERECTOMY  1980's   AMPUTATION Right 02/24/2018   Procedure: RIGHT FOOT GREAT TOE AND 2ND TOE AMPUTATION;  Surgeon: Newt Minion, MD;  Location: [place];  Service: Orthopedics;  Laterality: Right;   AMPUTATION Right 04/30/2018   Procedure: RIGHT TRANSMETATARSAL AMPUTATION;  Surgeon: Newt Minion, MD;  Location: [place];  Service: Orthopedics;  Laterality: Right;   BIOPSY  05/27/2020   Procedure: BIOPSY;  Surgeon: [name], DO;  [facility];  Service: Endoscopy;;   CATARACT EXTRACTION, BILATERAL Bilateral ?2013   COLONOSCOPY W/ POLYPECTOMY     COLONOSCOPY WITH PROPOFOL N/A 03/13/2019   Procedure: COLONOSCOPY WITH PROPOFOL;  Surgeon: Jerene Bears, MD;  Location: [place];  Service: Gastroenterology;  Laterality: N/A;   [place]; 2007   "1 + 1"   ESOPHAGOGASTRODUODENOSCOPY (EGD) WITH PROPOFOL N/A 03/13/2019   Procedure: ESOPHAGOGASTRODUODENOSCOPY (EGD) WITH PROPOFOL;  Surgeon: Jerene Bears, MD;  Location: [place];  Service: Gastroenterology;  Laterality: N/A;   ESOPHAGOGASTRODUODENOSCOPY (EGD) WITH PROPOFOL N/A 05/27/2020   Procedure: ESOPHAGOGASTRODUODENOSCOPY (EGD) WITH PROPOFOL;  Surgeon: [name], DO;  [facility];  Service: Endoscopy;  Laterality: N/A;   EYE SURGERY Bilateral    lazer   HEMOSTASIS CLIP PLACEMENT  03/13/2019   Procedure: HEMOSTASIS CLIP PLACEMENT;  Surgeon: Jerene Bears, MD;  Location[facility] ENDOSCOPY;  Service:  Gastroenterology;;   KNEE ARTHROSCOPY Left 10/25/2006   POLYPECTOMY  03/13/2019   Procedure: POLYPECTOMY;  Surgeon: Jerene Bears, MD;  Location: [place];  Service: Gastroenterology;;   RIGHT HEART CATH N/A 07/24/2017   Procedure: RIGHT HEART CATH;  Surgeon: Jolaine Artist, MD;  Location: [place] [facility];  Service: Cardiovascular;  Laterality: N/A;   RIGHT HEART CATHETERIZATION N/A 09/22/2013   Procedure: RIGHT HEART CATH;  Surgeon: Jolaine Artist, MD;  Location: [facility];  Service: Cardiovascular;  Laterality: N/A;   SHOULDER ARTHROSCOPY WITH OPEN ROTATOR CUFF REPAIR Right 03/14/2014   Procedure: RIGHT SHOULDER ARTHROSCOPY WITH BICEPS RELEASE, OPEN SUBSCAPULA REPAIR, OPEN SUPRASPINATUS REPAIR.;  Surgeon: Meredith Pel, MD;  Location: [place];  Service: Orthopedics;  Laterality: Right;   TOE AMPUTATION Right 02/24/2018   GREAT TOE AND 2ND TOE AMPUTATION   TUBAL LIGATION  1970's   Current Outpatient Medications on File Prior to Visit  Medication Sig Dispense Refill   acetaminophen (TYLENOL) 500 MG tablet Take 1,000 mg by mouth every 6 (six) hours as needed for moderate pain or headache.     albuterol (VENTOLIN HFA) 108 (90 Base) MCG/ACT inhaler TAKE 2 PUFFS BY MOUTH EVERY 6 HOURS AS NEEDED FOR WHEEZE OR SHORTNESS OF BREATH 8.5 g 1   Ascorbic Acid (VITAMIN C) 1000 MG tablet Take 1,000 mg by mouth daily.     aspirin EC 81 MG tablet Take 1 tablet (81 mg total) by mouth daily with breakfast. 30 tablet 11   atorvastatin (LIPITOR) 80 MG tablet TAKE 1 TABLET BY MOUTH EVERY DAY 90 tablet 1   carvedilol (COREG) 12.5 MG tablet TAKE 1 TABLET (12.'5MG'$  TOTAL) BY MOUTH TWICE A DAY WITH A MEAL 180 tablet 1   cholecalciferol (VITAMIN D) 1000 units tablet Take 1,000 Units by mouth daily with supper.      clopidogrel (PLAVIX) 75 MG tablet TAKE 1 TABLET BY MOUTH EVERY DAY WITH BREAKFAST 90 tablet 3   Continuous Blood Gluc Receiver (FREESTYLE LIBRE 2 READER) DEVI See admin instructions.      Cranberry 250 MG TABS Take 500 mg by mouth daily.     diphenoxylate-atropine (LOMOTIL) 2.5-0.025 MG tablet Take 1 tablet by mouth 4 (four) times daily as needed for diarrhea or loose stools. 30 tablet 0   FERROUS SULFATE PO Take 325 mg by mouth 2 (two) times daily.     guaiFENesin-dextromethorphan (ROBITUSSIN DM) 100-10 MG/5ML syrup Take 10 mLs by mouth every 4 (four) hours as needed for cough. 118 mL 0   hydrocortisone cream 0.5 % Apply 1 application topically 2 (two) times daily. (Patient taking differently: Apply 1 application  topically 2 (two) times daily. As needed) 30 g 0   Insulin Disposable Pump (OMNIPOD DASH 5 PACK PODS) MISC Inject into the skin See admin instructions. Use continuously with Novolin R - change every 72 hours.     insulin NPH Human (HUMULIN N,NOVOLIN N) 100 UNIT/ML injection Inject 15 Units into the skin See admin instructions. Inject 15 units subcutaneously in the morning if CBG >300;     insulin regular (NOVOLIN R) 100 units/mL injection Inject into the skin See admin instructions. Manually add bolus to continuous dose via OmniPod 3 times daily per sliding scale (CBG 80-150 7 units, 151-200 9 units, 201-250 12 units, 251-300 14 units, 301- 400 17 units)     isosorbide mononitrate (IMDUR) 30 MG 24 hr tablet Take 0.5 tablets (15 mg total) by mouth daily. 45 tablet 3   levothyroxine (SYNTHROID, LEVOTHROID) 50 MCG tablet Take 1 tablet (50 mcg total) by mouth daily before breakfast. 90 tablet 1   magnesium oxide (MAG-OX) 400 (240 Mg) MG tablet Take 1 tablet (400 mg total) by mouth daily. 90 tablet 3   metolazone (ZAROXOLYN) 2.5 MG tablet Take 1 tablet (2.5 mg total) by mouth as directed. Every other week 2 tablet 3   Multiple Vitamins-Minerals (AIRBORNE GUMMIES PO) Take 2  tablets by mouth every evening.     Multiple Vitamins-Minerals (CENTRUM SILVER 50+WOMEN) TABS Take 1 tablet by mouth every evening.     nitroGLYCERIN (NITROSTAT) 0.4 MG SL tablet PLACE 1 TABLET (0.4 MG TOTAL)  UNDER THE TONGUE EVERY 5 (FIVE) MINUTES AS NEEDED FOR CHEST PAIN. 25 tablet 1   ONETOUCH VERIO test strip 3 (three) times daily.     polyethylene glycol (MIRALAX / GLYCOLAX) 17 g packet Take 17 g by mouth as needed for mild constipation.     pregabalin (LYRICA) 150 MG capsule Take 150 mg by mouth 2 (two) times daily.     sitaGLIPtin (JANUVIA) 25 MG tablet Take 1 tablet (25 mg total) by mouth daily. 90 tablet 3   torsemide (DEMADEX) 20 MG tablet Take 4 tablets (80 mg total) by mouth 2 (two) times daily. 240 tablet 3   TRINTELLIX 10 MG TABS tablet TAKE 1 TABLET BY MOUTH EVERY DAY 30 tablet 5   ursodiol (ACTIGALL) 500 MG tablet Take 500 mg by mouth 3 (three) times daily.     vitamin B-12 (CYANOCOBALAMIN) 1000 MCG tablet Take 1,000 mcg by mouth daily.     zinc gluconate 50 MG tablet Take 50 mg by mouth daily.     No current facility-administered medications on file prior to visit.   Allergies  Allergen Reactions   Drug Ingredient [Cephalexin] Diarrhea    Pt reports heavy diarrhea with use of keflex   Codeine Nausea And Vomiting and Other (See Comments)   Social History   Socioeconomic History   Marital status: Married    Spouse name: Percell Miller   Number of children: 3   Years of education: 12th   Highest education level: Not on file  Occupational History    Employer: UNEMPLOYED  Tobacco Use   Smoking status: Former    Packs/day: 3.00    Years: 32.00    Total pack years: 96.00    Types: Cigarettes    Quit date: 10/24/1997    Years since quitting: 24.1   Smokeless tobacco: Never  Vaping Use   Vaping Use: Never used  Substance and Sexual Activity   Alcohol use: Not Currently    Comment: "might have 2-3 daiquiris in the summer"   Drug use: No   Sexual activity: Not Currently  Other Topics Concern   Not on file  Social History Narrative   Pt lives at home with her spouse.Caffeine Use- 3 sodas daily.   Social Determinants of Health   Financial Resource Strain: Low Risk   (08/16/2021)   Overall Financial Resource Strain (CARDIA)    Difficulty of Paying Living Expenses: Not hard at all  Food Insecurity: No Food Insecurity (08/16/2021)   Hunger Vital Sign    Worried About Running Out of Food in the Last Year: Never true    Ran Out of Food in the Last Year: Never true  Transportation Needs: No Transportation Needs (08/16/2021)   PRAPARE - Hydrologist (Medical): No    Lack of Transportation (Non-Medical): No  Physical Activity: Inactive (08/16/2021)   Exercise Vital Sign    Days of Exercise per Week: 0 days    Minutes of Exercise per Session: 0 min  Stress: No Stress Concern Present (08/16/2021)   [place]    Feeling of Stress : Not at all  Social Connections: Moderately Isolated (08/16/2021)   Social Connection and Isolation Panel [NHANES]    Frequency of  Communication with Friends and Family: More than three times a week    Frequency of Social Gatherings with Friends and Family: More than three times a week    Attends Religious Services: Never    Marine scientist or Organizations: No    Attends Archivist Meetings: Never    Marital Status: Married  Human resources officer Violence: Not At Risk (08/16/2021)   Humiliation, Afraid, Rape, and Kick questionnaire    Fear of Current or Ex-Partner: No    Emotionally Abused: No    Physically Abused: No    Sexually Abused: No     Review of Systems     Objective:   Physical Exam Constitutional:      General: She is not in acute distress.    Appearance: Normal appearance. She is obese. She is not ill-appearing or toxic-appearing.  Cardiovascular:     Rate and Rhythm: Normal rate and regular rhythm.     Heart sounds: Normal heart sounds. No murmur heard.    No friction rub. No gallop.  Pulmonary:     Effort: Pulmonary effort is normal. No respiratory distress.     Breath sounds: Normal breath sounds. No  stridor. No wheezing, rhonchi or rales.  Abdominal:     General: Bowel sounds are normal.     Palpations: Abdomen is soft.     Tenderness: There is no abdominal tenderness.  Musculoskeletal:     Right lower leg: No edema.     Left lower leg: Laceration present. No edema.       Legs:  Skin:    General: Skin is warm.     Findings: Lesion present. No erythema.  Neurological:     Mental Status: She is alert.         Assessment & Plan:  Venous ulcer of left leg ([place]) Wounds have healed.  I removed the [employer].  I covered the skin tear with a Tegaderm.  I then placed the patient back in her own compression hose.  I recommended the patient replace the Tegaderm every 2 to 3 days until the skin tear is healed.  Continue to wear the compression hose moving forward to prevent recurrent venous stasis ulcers.

## 2021-12-24 ENCOUNTER — Ambulatory Visit (HOSPITAL_COMMUNITY)
Admission: RE | Admit: 2021-12-24 | Discharge: 2021-12-24 | Disposition: A | Discharge status: Home / Self Care | Payer: HMO | Source: Ambulatory Visit | Attending: Cardiology | Admitting: Cardiology

## 2021-12-24 DIAGNOSIS — I5022 Chronic systolic (congestive) heart failure: Secondary | ICD-10-CM | POA: Diagnosis not present

## 2021-12-24 LAB — BASIC METABOLIC PANEL
Anion gap: 12 (ref 5–15)
BUN: 67 mg/dL — ABNORMAL HIGH (ref 8–23)
CO2: 28 mmol/L (ref 22–32)
Calcium: 9.3 mg/dL (ref 8.9–10.3)
Chloride: 89 mmol/L — ABNORMAL LOW (ref 98–111)
Creatinine, Ser: 3.33 mg/dL — ABNORMAL HIGH (ref 0.44–1.00)
GFR, Estimated: 14 mL/min — ABNORMAL LOW (ref >60)
Glucose, Bld: 553 mg/dL (ref 70–99)
Potassium: 3.5 mmol/L (ref 3.5–5.1)
Sodium: 129 mmol/L — ABNORMAL LOW (ref 135–145)

## 2022-01-02 ENCOUNTER — Ambulatory Visit: Payer: HMO | Admitting: Orthopedic Surgery

## 2022-01-14 ENCOUNTER — Inpatient Hospital Stay (HOSPITAL_COMMUNITY): Payer: HMO

## 2022-01-14 ENCOUNTER — Inpatient Hospital Stay (HOSPITAL_COMMUNITY)
Admission: EM | Admit: 2022-01-14 | Discharge: 2022-01-23 | DRG: 637 | Disposition: A | Discharge status: Home Health | Payer: HMO | Attending: Internal Medicine | Admitting: Internal Medicine

## 2022-01-14 ENCOUNTER — Emergency Department (HOSPITAL_COMMUNITY): Payer: HMO

## 2022-01-14 ENCOUNTER — Other Ambulatory Visit: Payer: Self-pay

## 2022-01-14 DIAGNOSIS — Z9181 History of falling: Secondary | ICD-10-CM

## 2022-01-14 DIAGNOSIS — E861 Hypovolemia: Secondary | ICD-10-CM

## 2022-01-14 DIAGNOSIS — I959 Hypotension, unspecified: Secondary | ICD-10-CM | POA: Diagnosis not present

## 2022-01-14 DIAGNOSIS — E039 Hypothyroidism, unspecified: Secondary | ICD-10-CM | POA: Diagnosis present

## 2022-01-14 DIAGNOSIS — Z8249 Family history of ischemic heart disease and other diseases of the circulatory system: Secondary | ICD-10-CM

## 2022-01-14 DIAGNOSIS — L89316 Pressure-induced deep tissue damage of right buttock: Secondary | ICD-10-CM | POA: Diagnosis not present

## 2022-01-14 DIAGNOSIS — R571 Hypovolemic shock: Secondary | ICD-10-CM | POA: Diagnosis present

## 2022-01-14 DIAGNOSIS — I248 Other forms of acute ischemic heart disease: Secondary | ICD-10-CM | POA: Diagnosis present

## 2022-01-14 DIAGNOSIS — E875 Hyperkalemia: Secondary | ICD-10-CM | POA: Diagnosis present

## 2022-01-14 DIAGNOSIS — I2510 Atherosclerotic heart disease of native coronary artery without angina pectoris: Secondary | ICD-10-CM | POA: Diagnosis present

## 2022-01-14 DIAGNOSIS — N184 Chronic kidney disease, stage 4 (severe): Secondary | ICD-10-CM | POA: Diagnosis present

## 2022-01-14 DIAGNOSIS — I509 Heart failure, unspecified: Secondary | ICD-10-CM

## 2022-01-14 DIAGNOSIS — G9341 Metabolic encephalopathy: Secondary | ICD-10-CM | POA: Diagnosis present

## 2022-01-14 DIAGNOSIS — R8281 Pyuria: Secondary | ICD-10-CM | POA: Diagnosis not present

## 2022-01-14 DIAGNOSIS — Z794 Long term (current) use of insulin: Secondary | ICD-10-CM

## 2022-01-14 DIAGNOSIS — I5023 Acute on chronic systolic (congestive) heart failure: Secondary | ICD-10-CM

## 2022-01-14 DIAGNOSIS — I429 Cardiomyopathy, unspecified: Secondary | ICD-10-CM | POA: Diagnosis not present

## 2022-01-14 DIAGNOSIS — Y92009 Unspecified place in unspecified non-institutional (private) residence as the place of occurrence of the external cause: Secondary | ICD-10-CM | POA: Diagnosis not present

## 2022-01-14 DIAGNOSIS — I4891 Unspecified atrial fibrillation: Secondary | ICD-10-CM | POA: Diagnosis present

## 2022-01-14 DIAGNOSIS — R778 Other specified abnormalities of plasma proteins: Secondary | ICD-10-CM | POA: Diagnosis not present

## 2022-01-14 DIAGNOSIS — J9690 Respiratory failure, unspecified, unspecified whether with hypoxia or hypercapnia: Secondary | ICD-10-CM | POA: Diagnosis not present

## 2022-01-14 DIAGNOSIS — E1142 Type 2 diabetes mellitus with diabetic polyneuropathy: Secondary | ICD-10-CM | POA: Diagnosis present

## 2022-01-14 DIAGNOSIS — E1110 Type 2 diabetes mellitus with ketoacidosis without coma: Secondary | ICD-10-CM | POA: Diagnosis not present

## 2022-01-14 DIAGNOSIS — R079 Chest pain, unspecified: Secondary | ICD-10-CM | POA: Diagnosis not present

## 2022-01-14 DIAGNOSIS — I4892 Unspecified atrial flutter: Secondary | ICD-10-CM | POA: Diagnosis not present

## 2022-01-14 DIAGNOSIS — I130 Hypertensive heart and chronic kidney disease with heart failure and stage 1 through stage 4 chronic kidney disease, or unspecified chronic kidney disease: Secondary | ICD-10-CM | POA: Diagnosis not present

## 2022-01-14 DIAGNOSIS — Z89431 Acquired absence of right foot: Secondary | ICD-10-CM

## 2022-01-14 DIAGNOSIS — E860 Dehydration: Secondary | ICD-10-CM | POA: Diagnosis not present

## 2022-01-14 DIAGNOSIS — Z90710 Acquired absence of both cervix and uterus: Secondary | ICD-10-CM

## 2022-01-14 DIAGNOSIS — N179 Acute kidney failure, unspecified: Secondary | ICD-10-CM | POA: Diagnosis not present

## 2022-01-14 DIAGNOSIS — Z9861 Coronary angioplasty status: Secondary | ICD-10-CM | POA: Diagnosis not present

## 2022-01-14 DIAGNOSIS — I83029 Varicose veins of left lower extremity with ulcer of unspecified site: Secondary | ICD-10-CM | POA: Diagnosis present

## 2022-01-14 DIAGNOSIS — E662 Morbid (severe) obesity with alveolar hypoventilation: Secondary | ICD-10-CM | POA: Diagnosis present

## 2022-01-14 DIAGNOSIS — E1151 Type 2 diabetes mellitus with diabetic peripheral angiopathy without gangrene: Secondary | ICD-10-CM | POA: Diagnosis present

## 2022-01-14 DIAGNOSIS — K828 Other specified diseases of gallbladder: Secondary | ICD-10-CM | POA: Diagnosis not present

## 2022-01-14 DIAGNOSIS — Z7984 Long term (current) use of oral hypoglycemic drugs: Secondary | ICD-10-CM

## 2022-01-14 DIAGNOSIS — E1165 Type 2 diabetes mellitus with hyperglycemia: Secondary | ICD-10-CM | POA: Diagnosis present

## 2022-01-14 DIAGNOSIS — E871 Hypo-osmolality and hyponatremia: Secondary | ICD-10-CM | POA: Diagnosis not present

## 2022-01-14 DIAGNOSIS — F32A Depression, unspecified: Secondary | ICD-10-CM | POA: Diagnosis not present

## 2022-01-14 DIAGNOSIS — R34 Anuria and oliguria: Secondary | ICD-10-CM | POA: Diagnosis not present

## 2022-01-14 DIAGNOSIS — E785 Hyperlipidemia, unspecified: Secondary | ICD-10-CM | POA: Diagnosis present

## 2022-01-14 DIAGNOSIS — F419 Anxiety disorder, unspecified: Secondary | ICD-10-CM | POA: Diagnosis present

## 2022-01-14 DIAGNOSIS — I4510 Unspecified right bundle-branch block: Secondary | ICD-10-CM | POA: Diagnosis present

## 2022-01-14 DIAGNOSIS — I252 Old myocardial infarction: Secondary | ICD-10-CM

## 2022-01-14 DIAGNOSIS — I5032 Chronic diastolic (congestive) heart failure: Secondary | ICD-10-CM | POA: Diagnosis not present

## 2022-01-14 DIAGNOSIS — Z8673 Personal history of transient ischemic attack (TIA), and cerebral infarction without residual deficits: Secondary | ICD-10-CM

## 2022-01-14 DIAGNOSIS — Z7982 Long term (current) use of aspirin: Secondary | ICD-10-CM

## 2022-01-14 DIAGNOSIS — I5043 Acute on chronic combined systolic (congestive) and diastolic (congestive) heart failure: Secondary | ICD-10-CM | POA: Diagnosis present

## 2022-01-14 DIAGNOSIS — E1122 Type 2 diabetes mellitus with diabetic chronic kidney disease: Secondary | ICD-10-CM | POA: Diagnosis present

## 2022-01-14 DIAGNOSIS — Z6839 Body mass index (BMI) 39.0-39.9, adult: Secondary | ICD-10-CM

## 2022-01-14 DIAGNOSIS — I472 Ventricular tachycardia, unspecified: Secondary | ICD-10-CM | POA: Diagnosis not present

## 2022-01-14 DIAGNOSIS — R4182 Altered mental status, unspecified: Secondary | ICD-10-CM | POA: Diagnosis not present

## 2022-01-14 DIAGNOSIS — Z955 Presence of coronary angioplasty implant and graft: Secondary | ICD-10-CM

## 2022-01-14 DIAGNOSIS — R0902 Hypoxemia: Secondary | ICD-10-CM | POA: Diagnosis present

## 2022-01-14 DIAGNOSIS — Z9841 Cataract extraction status, right eye: Secondary | ICD-10-CM

## 2022-01-14 DIAGNOSIS — Z79899 Other long term (current) drug therapy: Secondary | ICD-10-CM

## 2022-01-14 DIAGNOSIS — Z9842 Cataract extraction status, left eye: Secondary | ICD-10-CM

## 2022-01-14 DIAGNOSIS — E1310 Other specified diabetes mellitus with ketoacidosis without coma: Secondary | ICD-10-CM | POA: Diagnosis not present

## 2022-01-14 DIAGNOSIS — I5022 Chronic systolic (congestive) heart failure: Secondary | ICD-10-CM | POA: Diagnosis not present

## 2022-01-14 DIAGNOSIS — N281 Cyst of kidney, acquired: Secondary | ICD-10-CM | POA: Diagnosis not present

## 2022-01-14 DIAGNOSIS — Z87891 Personal history of nicotine dependence: Secondary | ICD-10-CM

## 2022-01-14 DIAGNOSIS — Z7902 Long term (current) use of antithrombotics/antiplatelets: Secondary | ICD-10-CM

## 2022-01-14 DIAGNOSIS — Z87440 Personal history of urinary (tract) infections: Secondary | ICD-10-CM

## 2022-01-14 DIAGNOSIS — L89326 Pressure-induced deep tissue damage of left buttock: Secondary | ICD-10-CM | POA: Diagnosis present

## 2022-01-14 DIAGNOSIS — K8020 Calculus of gallbladder without cholecystitis without obstruction: Secondary | ICD-10-CM | POA: Diagnosis not present

## 2022-01-14 DIAGNOSIS — I10 Essential (primary) hypertension: Secondary | ICD-10-CM | POA: Diagnosis not present

## 2022-01-14 DIAGNOSIS — R8271 Bacteriuria: Secondary | ICD-10-CM | POA: Diagnosis present

## 2022-01-14 DIAGNOSIS — Z79890 Hormone replacement therapy (postmenopausal): Secondary | ICD-10-CM

## 2022-01-14 DIAGNOSIS — I9589 Other hypotension: Secondary | ICD-10-CM

## 2022-01-14 DIAGNOSIS — E876 Hypokalemia: Secondary | ICD-10-CM | POA: Diagnosis present

## 2022-01-14 DIAGNOSIS — R5381 Other malaise: Secondary | ICD-10-CM | POA: Diagnosis present

## 2022-01-14 DIAGNOSIS — T383X6A Underdosing of insulin and oral hypoglycemic [antidiabetic] drugs, initial encounter: Secondary | ICD-10-CM | POA: Diagnosis present

## 2022-01-14 LAB — COMPREHENSIVE METABOLIC PANEL
ALT: 24 U/L (ref 0–44)
AST: 28 U/L (ref 15–41)
Albumin: 3.4 g/dL — ABNORMAL LOW (ref 3.5–5.0)
Alkaline Phosphatase: 103 U/L (ref 38–126)
Anion gap: 36 — ABNORMAL HIGH (ref 5–15)
BUN: 96 mg/dL — ABNORMAL HIGH (ref 8–23)
CO2: 9 mmol/L — ABNORMAL LOW (ref 22–32)
Calcium: 8.1 mg/dL — ABNORMAL LOW (ref 8.9–10.3)
Chloride: 67 mmol/L — ABNORMAL LOW (ref 98–111)
Creatinine, Ser: 5.09 mg/dL — ABNORMAL HIGH (ref 0.44–1.00)
GFR, Estimated: 8 mL/min — ABNORMAL LOW (ref >60)
Glucose, Bld: >1200 mg/dL (ref 70–99)
Potassium: 6 mmol/L — ABNORMAL HIGH (ref 3.5–5.1)
Sodium: 112 mmol/L — CL (ref 135–145)
Total Bilirubin: 1.9 mg/dL — ABNORMAL HIGH (ref 0.3–1.2)
Total Protein: 6.3 g/dL — ABNORMAL LOW (ref 6.5–8.1)

## 2022-01-14 LAB — TROPONIN I (HIGH SENSITIVITY)
Troponin I (High Sensitivity): 55 ng/L — ABNORMAL HIGH (ref <18)
Troponin I (High Sensitivity): 82 ng/L — ABNORMAL HIGH (ref <18)

## 2022-01-14 LAB — I-STAT CHEM 8, ED
BUN: 119 mg/dL — ABNORMAL HIGH (ref 8–23)
Calcium, Ion: 0.99 mmol/L — ABNORMAL LOW (ref 1.15–1.40)
Chloride: 74 mmol/L — ABNORMAL LOW (ref 98–111)
Creatinine, Ser: 4.5 mg/dL — ABNORMAL HIGH (ref 0.44–1.00)
Glucose, Bld: >700 mg/dL (ref 70–99)
HCT: 40 % (ref 36.0–46.0)
Hemoglobin: 13.6 g/dL (ref 12.0–15.0)
Potassium: 5.9 mmol/L — ABNORMAL HIGH (ref 3.5–5.1)
Sodium: 106 mmol/L — CL (ref 135–145)
TCO2: 12 mmol/L — ABNORMAL LOW (ref 22–32)

## 2022-01-14 LAB — I-STAT VENOUS BLOOD GAS, ED
Acid-base deficit: 20 mmol/L — ABNORMAL HIGH (ref 0.0–2.0)
Bicarbonate: 9.2 mmol/L — ABNORMAL LOW (ref 20.0–28.0)
Calcium, Ion: 0.97 mmol/L — ABNORMAL LOW (ref 1.15–1.40)
HCT: 40 % (ref 36.0–46.0)
Hemoglobin: 13.6 g/dL (ref 12.0–15.0)
O2 Saturation: 61 %
Potassium: 5.9 mmol/L — ABNORMAL HIGH (ref 3.5–5.1)
Sodium: 105 mmol/L — CL (ref 135–145)
TCO2: 10 mmol/L — ABNORMAL LOW (ref 22–32)
pCO2, Ven: 31.6 mmHg — ABNORMAL LOW (ref 44–60)
pH, Ven: 7.072 — CL (ref 7.25–7.43)
pO2, Ven: 43 mmHg (ref 32–45)

## 2022-01-14 LAB — CBC WITH DIFFERENTIAL/PLATELET
Abs Immature Granulocytes: 0.05 10*3/uL (ref 0.00–0.07)
Basophils Absolute: 0 10*3/uL (ref 0.0–0.1)
Basophils Relative: 0 %
Eosinophils Absolute: 0 10*3/uL (ref 0.0–0.5)
Eosinophils Relative: 0 %
HCT: 41.4 % (ref 36.0–46.0)
Hemoglobin: 11.6 g/dL — ABNORMAL LOW (ref 12.0–15.0)
Immature Granulocytes: 0 %
Lymphocytes Relative: 4 %
Lymphs Abs: 0.6 10*3/uL — ABNORMAL LOW (ref 0.7–4.0)
MCH: 27.3 pg (ref 26.0–34.0)
MCHC: 28 g/dL — ABNORMAL LOW (ref 30.0–36.0)
MCV: 97.4 fL (ref 80.0–100.0)
Monocytes Absolute: 0.6 10*3/uL (ref 0.1–1.0)
Monocytes Relative: 4 %
Neutro Abs: 13.3 10*3/uL — ABNORMAL HIGH (ref 1.7–7.7)
Neutrophils Relative %: 92 %
Platelets: 162 10*3/uL (ref 150–400)
RBC: 4.25 MIL/uL (ref 3.87–5.11)
RDW: 16.9 % — ABNORMAL HIGH (ref 11.5–15.5)
WBC: 14.6 10*3/uL — ABNORMAL HIGH (ref 4.0–10.5)
nRBC: 0 % (ref 0.0–0.2)

## 2022-01-14 LAB — BASIC METABOLIC PANEL
Anion gap: 27 — ABNORMAL HIGH (ref 5–15)
BUN: 96 mg/dL — ABNORMAL HIGH (ref 8–23)
CO2: 14 mmol/L — ABNORMAL LOW (ref 22–32)
Calcium: 7.8 mg/dL — ABNORMAL LOW (ref 8.9–10.3)
Chloride: 73 mmol/L — ABNORMAL LOW (ref 98–111)
Creatinine, Ser: 5.29 mg/dL — ABNORMAL HIGH (ref 0.44–1.00)
GFR, Estimated: 8 mL/min — ABNORMAL LOW (ref >60)
Glucose, Bld: >1200 mg/dL (ref 70–99)
Potassium: 4 mmol/L (ref 3.5–5.1)
Sodium: 114 mmol/L — CL (ref 135–145)

## 2022-01-14 LAB — LACTIC ACID, PLASMA
Lactic Acid, Venous: 3.2 mmol/L (ref 0.5–1.9)
Lactic Acid, Venous: 3.1 mmol/L (ref 0.5–1.9)

## 2022-01-14 LAB — CBG MONITORING, ED
Glucose-Capillary: >600 mg/dL (ref 70–99)
Glucose-Capillary: >600 mg/dL (ref 70–99)
Glucose-Capillary: >600 mg/dL (ref 70–99)
Glucose-Capillary: >600 mg/dL (ref 70–99)

## 2022-01-14 LAB — GLUCOSE, CAPILLARY
Glucose-Capillary: >600 mg/dL (ref 70–99)
Glucose-Capillary: >600 mg/dL (ref 70–99)

## 2022-01-14 LAB — LIPASE, BLOOD: Lipase: 38 U/L (ref 11–51)

## 2022-01-14 LAB — MAGNESIUM: Magnesium: 2 mg/dL (ref 1.7–2.4)

## 2022-01-14 LAB — PROTIME-INR
INR: 1.3 — ABNORMAL HIGH (ref 0.8–1.2)
Prothrombin Time: 16.2 seconds — ABNORMAL HIGH (ref 11.4–15.2)

## 2022-01-14 LAB — APTT: aPTT: 24 seconds (ref 24–36)

## 2022-01-14 MED ORDER — DEXTROSE 50 % IV SOLN: 0.0000 mL | INTRAVENOUS | Status: DC | PRN

## 2022-01-14 MED ORDER — DEXTROSE IN LACTATED RINGERS 5 % IV SOLN: INTRAVENOUS | Status: DC

## 2022-01-14 MED ORDER — LACTATED RINGERS IV SOLN: INTRAVENOUS | Status: DC

## 2022-01-14 MED ORDER — LACTATED RINGERS IV BOLUS
1000.0000 mL | Freq: Once | INTRAVENOUS | Status: AC
Start: 2022-01-14 — End: 2022-01-15
  Administered 2022-01-14: 1000 mL via INTRAVENOUS

## 2022-01-14 MED ORDER — HEPARIN SODIUM (PORCINE) 5000 UNIT/ML IJ SOLN
5000.0000 [IU] | Freq: Three times a day (TID) | INTRAMUSCULAR | Status: DC
  Administered 2022-01-14 – 2022-01-16 (×6): 5000 [IU] via SUBCUTANEOUS
  Filled 2022-01-14 (×6): qty 1

## 2022-01-14 MED ORDER — SODIUM CHLORIDE 0.9 % IV SOLN
1.0000 g | Freq: Once | INTRAVENOUS | Status: DC
  Filled 2022-01-14: qty 10

## 2022-01-14 MED ORDER — LACTATED RINGERS IV BOLUS
1000.0000 mL | Freq: Once | INTRAVENOUS | Status: AC
  Administered 2022-01-14: 1000 mL via INTRAVENOUS

## 2022-01-14 MED ORDER — SODIUM CHLORIDE 0.9 % IV BOLUS
500.0000 mL | Freq: Once | INTRAVENOUS | Status: AC
  Administered 2022-01-14: 500 mL via INTRAVENOUS

## 2022-01-14 MED ORDER — INSULIN REGULAR(HUMAN) IN NACL 100-0.9 UT/100ML-% IV SOLN
INTRAVENOUS | Status: DC
  Administered 2022-01-14: 11.5 [IU]/h via INTRAVENOUS
  Administered 2022-01-15: 15 [IU]/h via INTRAVENOUS
  Administered 2022-01-15 (×2): 11.5 [IU]/h via INTRAVENOUS
  Administered 2022-01-16: 6 [IU]/h via INTRAVENOUS
  Filled 2022-01-14 (×5): qty 100

## 2022-01-14 MED ORDER — SODIUM CHLORIDE 0.9 % IV SOLN
1.0000 g | Freq: Once | INTRAVENOUS | Status: AC
  Administered 2022-01-15: 1 g via INTRAVENOUS
  Filled 2022-01-14: qty 20

## 2022-01-14 NOTE — Progress Notes (Deleted)
Pharmacy Antibiotic Note  Susan [name] is a 71 y.o. female admitted on 01/14/2022 with c/f UTI.  Pt is altered with DKA + dehydration, hypotn, elevated WBC 14.6. Pharmacy has been consulted for ciprofloxacin dosing.  Past micro data: Ucx 07/31/21: kleb pneumo (R amp) Ucx 03/09/21: enterobacter cloc (R cefaz, R pip/tazo, R bactrim, I cefepime)   Plan: Ciprofloxacin '400mg'$  IV q24h Monitor clinical progress, renal recovery, CBC, culture data to narrow or d/c therapy      Temp (24hrs), Avg:97.9 F (36.6 C), Min:97.9 F (36.6 C), Max:97.9 F (36.6 C)  Recent Labs  Lab 01/14/22 1650 01/14/22 1654  WBC 14.6*  --   CREATININE 5.09* 4.50*  LATICACIDVEN 3.2*  --     CrCl cannot be calculated (Unknown ideal weight.).    Allergies  Allergen Reactions   Drug Ingredient [Cephalexin] Diarrhea    Pt reports heavy diarrhea with use of keflex   Codeine Nausea And Vomiting and Other (See Comments)    Antimicrobials this admission: cipro 8/22 >>    Dose adjustments this admission:   Microbiology results: 8/22 BCx:  8/22 UCx:     Thank you for allowing pharmacy to be a part of this patient's care.  Carolin Guernsey Clinical pharmacist 01/14/2022 9:06 PM

## 2022-01-14 NOTE — ED Provider Notes (Signed)
[facility] EMERGENCY DEPARTMENT Provider Note   CSN: [id number] Arrival date & time: 01/14/22  1551     History  Chief Complaint  Patient presents with   Diarrhea   Fatigue   Nausea   Emesis    Susan [name] is a 71 y.o. female. With a hx of CKD, CHF, CAD, DM, who presents to the ED for evaluation of N/V/D, abdominal pain, chest pain. Patient was seen in triage and deemed ill appearing and brought back to a room with a CBG of >600. Upon evaluation pt was lethargic but arousable. Required multiple prompts per answer. States she has had abdominal pain, chest pain and has felt sick for the past multiple weeks. Has run out of water in her home and has not been taking her medication for the past 3 days. States she has injectable insulin but has not been taking it for 3 days. Reports polyuria, polydipsia and polyphagia. Reports abdominal pain to be generalized and aching in nature.  History is limited due to patient's altered mental status.   Diarrhea Associated symptoms: abdominal pain and vomiting   Emesis Associated symptoms: abdominal pain and diarrhea        Home Medications Prior to Admission medications   Medication Sig Start Date End Date Taking? Authorizing Provider  acetaminophen (TYLENOL) 500 MG tablet Take 1,000 mg by mouth every 6 (six) hours as needed for moderate pain or headache.    [provider]  albuterol (VENTOLIN HFA) 108 (90 Base) MCG/ACT inhaler TAKE 2 PUFFS BY MOUTH EVERY 6 HOURS AS NEEDED FOR WHEEZE OR SHORTNESS OF BREATH 06/27/19   [place], [name], MD  Ascorbic Acid (VITAMIN C) 1000 MG tablet Take 1,000 mg by mouth daily.    [provider]  aspirin EC 81 MG tablet Take 1 tablet (81 mg total) by mouth daily with breakfast. 05/28/20   [place] Brick, Courage, MD  atorvastatin (LIPITOR) 80 MG tablet TAKE 1 TABLET BY MOUTH EVERY DAY 09/20/21   Bensimhon, Shaune Pascal, MD  carvedilol (COREG) 12.5 MG tablet TAKE 1 TABLET (12.'5MG'$  TOTAL) BY  MOUTH TWICE A DAY WITH A MEAL 09/20/21   [name], MD  cholecalciferol (VITAMIN D) 1000 units tablet Take 1,000 Units by mouth daily with supper.     [provider]  clopidogrel (PLAVIX) 75 MG tablet TAKE 1 TABLET BY MOUTH EVERY DAY WITH BREAKFAST 07/22/21   Bensimhon, Shaune Pascal, MD  Continuous Blood Gluc Receiver (FREESTYLE LIBRE 2 READER) DEVI See admin instructions. 09/16/21   [provider]  Cranberry 250 MG TABS Take 500 mg by mouth daily.    [provider]  diphenoxylate-atropine (LOMOTIL) 2.5-0.025 MG tablet Take 1 tablet by mouth 4 (four) times daily as needed for diarrhea or loose stools. 08/19/21   [name], MD  FERROUS SULFATE PO Take 325 mg by mouth 2 (two) times daily.    [provider]  guaiFENesin-dextromethorphan (ROBITUSSIN DM) 100-10 MG/5ML syrup Take 10 mLs by mouth every 4 (four) hours as needed for cough. 03/13/20   [place] Donnamarie Poag, DO  hydrocortisone cream 0.5 % Apply 1 application topically 2 (two) times daily. Patient taking differently: Apply 1 application  topically 2 (two) times daily. As needed 03/10/21   Florencia Reasons, MD  Insulin Disposable Pump (OMNIPOD DASH 5 PACK PODS) MISC Inject into the skin See admin instructions. Use continuously with Novolin R - change every 72 hours. 11/18/18   [provider]  insulin NPH Human (HUMULIN  N,NOVOLIN N) 100 UNIT/ML injection Inject 15 Units into the skin See admin instructions. Inject 15 units subcutaneously in the morning if CBG >300;    [provider]  insulin regular (NOVOLIN R) 100 units/mL injection Inject into the skin See admin instructions. Manually add bolus to continuous dose via OmniPod 3 times daily per sliding scale (CBG 80-150 7 units, 151-200 9 units, 201-250 12 units, 251-300 14 units, 301- 400 17 units)    [provider]  isosorbide mononitrate (IMDUR) 30 MG 24 hr tablet Take 0.5 tablets (15 mg total) by mouth daily. 12/10/21   [place],  Maricela Bo, FNP  levothyroxine (SYNTHROID, LEVOTHROID) 50 MCG tablet Take 1 tablet (50 mcg total) by mouth daily before breakfast. 11/07/16   Dena Billet B, PA-C  magnesium oxide (MAG-OX) 400 (240 Mg) MG tablet Take 1 tablet (400 mg total) by mouth daily. 07/18/21   [name], MD  metolazone (ZAROXOLYN) 2.5 MG tablet Take 1 tablet (2.5 mg total) by mouth as directed. Every other week 12/10/21 03/10/22  Rafael Bihari, FNP  Multiple Vitamins-Minerals (AIRBORNE GUMMIES PO) Take 2 tablets by mouth every evening.    [provider]  Multiple Vitamins-Minerals (CENTRUM SILVER 50+WOMEN) TABS Take 1 tablet by mouth every evening.    [provider]  nitroGLYCERIN (NITROSTAT) 0.4 MG SL tablet PLACE 1 TABLET (0.4 MG TOTAL) UNDER THE TONGUE EVERY 5 (FIVE) MINUTES AS NEEDED FOR CHEST PAIN. 02/06/21   Larey Dresser, MD  [facility] [place] VERIO test strip 3 (three) times daily. 07/25/20   [provider]  polyethylene glycol (MIRALAX / GLYCOLAX) 17 g packet Take 17 g by mouth as needed for mild constipation.    [provider]  pregabalin (LYRICA) 150 MG capsule Take 150 mg by mouth 2 (two) times daily. 03/20/21   [provider]  sitaGLIPtin (JANUVIA) 25 MG tablet Take 1 tablet (25 mg total) by mouth daily. 03/12/21   [name], MD  torsemide (DEMADEX) 20 MG tablet Take 4 tablets (80 mg total) by mouth 2 (two) times daily. 12/10/21   [place], Maricela Bo, FNP  TRINTELLIX 10 MG TABS tablet TAKE 1 TABLET BY MOUTH EVERY DAY 10/22/21   [name], MD  ursodiol (ACTIGALL) 500 MG tablet Take 500 mg by mouth 3 (three) times daily. 12/11/20   [provider]  vitamin B-12 (CYANOCOBALAMIN) 1000 MCG tablet Take 1,000 mcg by mouth daily.    [provider]  zinc gluconate 50 MG tablet Take 50 mg by mouth daily.    [provider]      Allergies    Drug ingredient [cephalexin] and Codeine    Review of Systems   Review of Systems   Gastrointestinal:  Positive for abdominal pain, diarrhea, nausea and vomiting.  Endocrine: Positive for polydipsia, polyphagia and polyuria.  Genitourinary:  Positive for frequency and urgency.  All other systems reviewed and are negative.   Physical Exam Updated Vital Signs BP 94/68 (BP Location: Right Arm)   Pulse 70   Temp 97.9 F (36.6 C) (Oral)   Resp 18   SpO2 94%  Physical Exam Vitals and nursing note reviewed.  Constitutional:      General: She is in acute distress.     Appearance: She is well-developed. She is ill-appearing.  HENT:     Head: Normocephalic and atraumatic.     Mouth/Throat:     Mouth: Mucous membranes are dry.  Eyes:     Conjunctiva/sclera: Conjunctivae normal.  Pupils: Pupils are equal, round, and reactive to light.  Cardiovascular:     Rate and Rhythm: Normal rate and regular rhythm.     Heart sounds: No murmur heard. Pulmonary:     Effort: Pulmonary effort is normal. No respiratory distress.  Abdominal:     General: Bowel sounds are normal.     Palpations: Abdomen is soft.     Tenderness: There is abdominal tenderness (generalized).  Musculoskeletal:     Cervical back: Neck supple.  Skin:    General: Skin is warm and dry.     Capillary Refill: Capillary refill takes less than 2 seconds.  Neurological:     Mental Status: She is lethargic and disoriented.     GCS: GCS eye subscore is 4. GCS verbal subscore is 4. GCS motor subscore is 6.  Psychiatric:        Mood and Affect: Mood normal.     ED Results / Procedures / Treatments   Labs (all labs ordered are listed, but only abnormal results are displayed) Labs Reviewed  CBG MONITORING, ED - Abnormal; Notable for the following components:      Result Value   Glucose-Capillary >600 (*)    All other components within normal limits  COMPREHENSIVE METABOLIC PANEL  LIPASE, BLOOD  LACTIC ACID, PLASMA  LACTIC ACID, PLASMA  CBC WITH DIFFERENTIAL/PLATELET  URINALYSIS, ROUTINE W REFLEX  MICROSCOPIC  I-STAT CHEM 8, ED  I-STAT VENOUS BLOOD GAS, ED  TROPONIN I (HIGH SENSITIVITY)    EKG None  Radiology No results found.  Procedures .Critical Care  Performed by: Roylene Reason, PA-C Authorized by: Roylene Reason, PA-C   Critical care provider statement:    Critical care time (minutes):  65   Critical care was necessary to treat or prevent imminent or life-threatening deterioration of the following conditions:  Metabolic crisis   Critical care was time spent personally by me on the following activities:  Development of treatment plan with patient or surrogate, discussions with consultants, evaluation of patient's response to treatment, examination of patient, ordering and review of laboratory studies, ordering and review of radiographic studies, ordering and performing treatments and interventions, pulse oximetry, re-evaluation of patient's condition and review of old charts   I assumed direction of critical care for this patient from another provider in my specialty: no     Care discussed with: admitting provider   Comments:     Severe DKA, insulin drip     Medications Ordered in ED Medications - No data to display  ED Course/ Medical Decision Making/ A&P Clinical Course as of 01/14/22 2226  Tue Jan 14, 2022  1821 On reevaluation, patient's husband is now present.  He states that their water pump broke yesterday. They were without water for approximately 8 hours yesterday.  Patient typically uses an OmniPod insulin pump that lasts 3 days at a time. Husband states the meter on the pump has not been working so she has not used It for 3 days. She does not regularly check her BG at home. Husband gave one does of insulin via injection 2 days ago for high reading. [AS]  [id number] 71 year old female presenting in DKA, acidotic, hyperglycemic, anion gap, dehydrated clinically and on laboratory studies.  DKA protocol ordered.  Ultrasound-guided IV placed.  Will get a  central line.  Accepted by ICU team. [AG]  2100 Central line placed in left femoral vein by Dr. Pearline Cables [AS]  2201 DG Chest [facility] I personally reviewed and  interpreted the image.  Mild interstitial opacities, likely representing pulmonary edema [AS]    Clinical Course User Index [AG] Lianne Cure, DO [AS] Torin Whisner, [place] Folk, PA-C  This patient presents to the ED for concern of hyperglycemia, N/V/D, abdominal pain, chest pain, this involves an extensive number of treatment options, and is a complaint that carries with it a high risk of complications and morbidity.  The differential diagnosis for generalized abdominal pain includes, but is not limited to AAA, gastroenteritis, appendicitis, Bowel obstruction, Bowel perforation. Gastroparesis, DKA, Hernia, Inflammatory bowel disease, mesenteric ischemia, pancreatitis, peritonitis SBP, volvulus.     Co morbidities that complicate the patient evaluation  CHF with an EF of 25-30%, CKD, CAD, DM  My initial workup includes basic labs, troponin, VBG, lactate, lipase, urinalysis to assess for the differential listed above  Additional history obtained from: Nursing notes from this visit. Previous records within EMR system Echo on 12/10/2021 shows EF 25-30% Family husband is present and provides a portion of the history  I ordered, reviewed and interpreted labs which include: cbc, cmp, troponin, VBG, lactate, lipase, urinalysis, blood cultures, INR, APTT  I ordered imaging studies including chest x-ray I independently visualized and interpreted imaging which showed mild interstitial opacities suggesting pulmonary edema I agree with the radiologist interpretation   Cardiac Monitoring:  The patient was maintained on a cardiac monitor.  I personally viewed and interpreted the cardiac monitored which showed an underlying rhythm of: Sinus rhythm with PACs  Consultations Obtained:  I requested consultation with the Critical care team, Davis Gourd,  and discussed lab and imaging findings as well as pertinent plan - they recommend: they will assess for severity and admission to ICU.  ICU has assessed, will admit.  Patient is in DKA. Patient is severely dehydrated on physical exam and on lab studies. She is hypotensive and has been hypotensive with systolic pressures in the 80s throughout her stay. Blood glucose >1200, creatinine 5.09, leukocytosis of 14.6, lactate of 3.2, blood ph 7.072. patient has CHF with an EF of 25-30%, limiting amount of fluid that can be given in bolus. 500 cc NS bolus, insulin drip, rocephin ordered. Central line started in left femoral vein by Dr. Pearline Cables. Urine collected shows pyuria. critical care was consulted, assessed patient and accepted for admission for DKA with complex management.  Patient's case discussed with Dr. Pearline Cables who agrees with plan to admit to ICU for further workup and management of DKA, altered mental status, pyuria.  Note: Portions of this report may have been transcribed using voice recognition software. Every effort was made to ensure accuracy; however, inadvertent computerized transcription errors may still be present.                          Medical Decision Making Amount and/or Complexity of Data Reviewed Labs: ordered. Radiology: ordered.  Risk Prescription drug management. Decision regarding hospitalization.          Final Clinical Impression(s) / ED Diagnoses Final diagnoses:  None    Rx / DC Orders ED Discharge Orders     None         Roylene Reason, PA-C 08/67/61 9509    Lianne Cure, DO 32/67/12 2328

## 2022-01-14 NOTE — ED Triage Notes (Signed)
Pt c/o feeling "sick sick" since last Saturday; N/V/D, fatigue. States her blood sugar has been high x "a week." Poor PO intake. Hx CHF, CKD, CAD.  Pt ill-appearing in triage, charge notified, pt roomed.

## 2022-01-14 NOTE — ED Notes (Signed)
Attempted to give reportx1 

## 2022-01-14 NOTE — H&P (Cosign Needed Addendum)
NAME:  Susan [name], MRN:  [id number], DOB:  11-24-1950, LOS: 0 ADMISSION DATE:  01/14/2022, CONSULTATION DATE:  8/22 REFERRING MD:  Dr. Pearline Cables EDP, CHIEF COMPLAINT:  DKA   History of Present Illness:  Patient is encephalopathic and/or intubated. Therefore history has been obtained from chart review.   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 She presented to [facility] ED 8/22 with complaints of N/V/D, abdominal pain, and chest pain. CBG in triage noted at > 600. Reported symptoms for the past few weeks. Has not had water at home for the past 3 days. Has been non-compliant with insulin for 3 days PTA as well. She was lethargic, but arousable upon being roomed. Laboratory evaluation is significant for Glucose > 12000, K 6, Creatinine 5.09, BUN 96, venous pH 7.07. The patient was given 500cc bolus, started on an insulin infusion, and PCCM was asked to admit.   Pertinent  Medical History   has a past medical history of Acute MI ([place]) (1999; 2007), Anemia, Anginal pain ([place]), Anxiety, ARF (acute renal failure) ([place]) (06/2017), Arthritis, CAD (coronary artery disease), Carotid artery disease ([place]), CHF (congestive heart failure) ([place]), Chronic diastolic heart failure ([place]), Daily headache, Depression, Dyslipidemia, Dyspnea, Exertional shortness of breath, HTN (hypertension), Hypothyroidism, Neuropathy, Obesity, Osteoarthritis, Peripheral neuropathy, PONV (postoperative nausea and vomiting), RBBB (right bundle branch block), Stroke ([place]), Syncope, Tachycardia, Type II diabetes mellitus ([place]), Urinary incontinence, and Venous insufficiency.   Significant Hospital Events: Including procedures, antibiotic start and stop dates in addition to other pertinent events     Interim History / Subjective:    Objective   Blood pressure (!) 82/36, pulse 63,  temperature 97.9 F (36.6 C), temperature source Oral, resp. rate 18, SpO2 98 %.       No intake or output data in the 24 hours ending 01/14/22 1901 There were no vitals filed for this visit.  Examination: General: overweight elderly female in NAD HENT: [place]/AT, PERRL, no JVD Lungs: Clear bilateral breath sounds Cardiovascular: tachy, regular no MRG Abdomen: Soft, diffusely tender, non-distended.  Extremities: No acute deformity. R remote transmetatarsal amputation.  Neuro: Somnolent. Arouses to voice. Oriented to self. Not following commands.  GU: External foley in place.   Resolved Hospital Problem list     Assessment & Plan:   DKA: Poorly compliant and has gone the 3 days prior to admission without taking any insulin. Suspect she is quite a bit dehydrated as well. PTA.  - IVF 2 liters slowly now - Then LR infusion transition to D5 LR per protocol.  - Check beta-hydroxybutyrate  - Serial BMP - Other management per DKA protocol - holding home NPH, SSI  Hypotension: lactic acid 3.2. Suspect this is hypovolemic in nature. Cannot r/o sepsis with leukocytosis and malodorous urine. Leukocytosis likely reactive due to DKA. PTA. - Bolus IVF as above with care considering CHF - Can start peripheral pressors if needed, however, I suspect this will correct with volume.  - trend lactic.   Acute metabolic encephalopathy: DKA, uremia, hypotension all likely contributing - Supportive care and treatment as described in other sections of plan.   Possible UTI:  Previous cultures with klebsiella resistant to ampicillin and enterobacter resistant to cefazolin, cefepime, pip/tazo, and bactrim. Both sensitive to cipro.  - UA pending - Would do cipro, but long QT 538. Will start  meropenem. Hopefully will have UA back soon + PCT to help [name] narrow quickly.   Chronic HFrEF: LVEF 25-30% on echo in July CAD: PCI in 2000 & 2007 - Telemetry monitoring.  - trend trops - holding home atorvastatin,  asas, carvedilol, clopidogrel, isosorbide, metolazone, sitagliptin while NPO, hypotensive.   AKI on CKD IV - trend BMP - hopefully her renal function can recover, at risk of needing HD  Abdominal tenderness: lipase, alk phos normal - ultrasound  Hyperkalemia - allow to correct with correction of acidosis.  - Trending BMP  Nocturnal hypoxia - continue 2L QHS  Pseudohyponatremia - corrects to at least 130 considering glucose above upper reportable limit.    Best Practice (right click and "Reselect all SmartList Selections" daily)   Diet/type: NPO DVT prophylaxis: prophylactic heparin  GI prophylaxis: PPI Lines: N/A Foley:  N/A Code Status:  full code Last date of multidisciplinary goals of care discussion [ d/w husband. Full code. Would not want prolonged life support]  Labs   CBC: Recent Labs  Lab 01/14/22 1650 01/14/22 1654  WBC 14.6*  --   NEUTROABS 13.3*  --   HGB 11.6* 13.6  13.6  HCT 41.4 40.0  40.0  MCV 97.4  --   PLT 162  --     Basic Metabolic Panel: Recent Labs  Lab 01/14/22 1650 01/14/22 1654  NA 112* 105*  106*  K 6.0* 5.9*  5.9*  CL 67* 74*  CO2 9*  --   GLUCOSE >1,200* >700*  BUN 96* 119*  CREATININE 5.09* 4.50*  CALCIUM 8.1*  --    GFR: CrCl cannot be calculated (Unknown ideal weight.). Recent Labs  Lab 01/14/22 1650  WBC 14.6*  LATICACIDVEN 3.2*    Liver Function Tests: Recent Labs  Lab 01/14/22 1650  AST 28  ALT 24  ALKPHOS 103  BILITOT 1.9*  PROT 6.3*  ALBUMIN 3.4*   Recent Labs  Lab 01/14/22 1650  LIPASE 38   No results for input(s): "AMMONIA" in the last 168 hours.  ABG    Component Value Date/Time   PHART 7.417 03/10/2018 1358   PCO2ART 48.0 03/10/2018 1358   PO2ART 75.7 (L) 03/10/2018 1358   HCO3 9.2 (L) 01/14/2022 1654   TCO2 12 (L) 01/14/2022 1654   TCO2 10 (L) 01/14/2022 1654   ACIDBASEDEF 20.0 (H) 01/14/2022 1654   O2SAT 61 01/14/2022 1654     Coagulation Profile: No results for input(s):  "INR", "PROTIME" in the last 168 hours.  Cardiac Enzymes: No results for input(s): "CKTOTAL", "CKMB", "CKMBINDEX", "TROPONINI" in the last 168 hours.  HbA1C: Hgb A1c MFr Bld  Date/Time Value Ref Range Status  03/05/2021 05:34 PM 12.2 (H) 4.8 - 5.6 % Final    Comment:    (NOTE) Pre diabetes:          5.7%-6.4%  Diabetes:              >6.4%  Glycemic control for   <7.0% adults with diabetes   08/26/2020 08:01 AM 10.3 (H) 4.8 - 5.6 % Final    Comment:    (NOTE) Pre diabetes:          5.7%-6.4%  Diabetes:              >6.4%  Glycemic control for   <7.0% adults with diabetes     CBG: Recent Labs  Lab 01/14/22 1559 01/14/22 1745  GLUCAP >600* >600*    Review of Systems:   Patient is encephalopathic and/or intubated.  Therefore history has been obtained from chart review.    Past Medical History:  She,  has a past medical history of Acute MI ([place]) (1999; 2007), Anemia, Anginal pain ([place]), Anxiety, ARF (acute renal failure) ([place]) (06/2017), Arthritis, CAD (coronary artery disease), Carotid artery disease ([place]), CHF (congestive heart failure) ([place]), Chronic diastolic heart failure ([place]), Daily headache, Depression, Dyslipidemia, Dyspnea, Exertional shortness of breath, HTN (hypertension), Hypothyroidism, Neuropathy, Obesity, Osteoarthritis, Peripheral neuropathy, PONV (postoperative nausea and vomiting), RBBB (right bundle branch block), Stroke ([place]), Syncope, Tachycardia, Type II diabetes mellitus ([place]), Urinary incontinence, and Venous insufficiency.   Surgical History:   Past Surgical History:  Procedure Laterality Date   ABDOMINAL HYSTERECTOMY  1980's   AMPUTATION Right 02/24/2018   Procedure: RIGHT FOOT GREAT TOE AND 2ND TOE AMPUTATION;  Surgeon: Newt Minion, MD;  Location: [place];  Service: Orthopedics;  Laterality: Right;   AMPUTATION Right 04/30/2018   Procedure: RIGHT TRANSMETATARSAL AMPUTATION;  Surgeon: Newt Minion, MD;  Location: [place];  Service: Orthopedics;   Laterality: Right;   BIOPSY  05/27/2020   Procedure: BIOPSY;  Surgeon: [name], DO;  [facility];  Service: Endoscopy;;   CATARACT EXTRACTION, BILATERAL Bilateral ?2013   COLONOSCOPY W/ POLYPECTOMY     COLONOSCOPY WITH PROPOFOL N/A 03/13/2019   Procedure: COLONOSCOPY WITH PROPOFOL;  Surgeon: Jerene Bears, MD;  Location: [place];  Service: Gastroenterology;  Laterality: N/A;   [place]; 2007   "1 + 1"   ESOPHAGOGASTRODUODENOSCOPY (EGD) WITH PROPOFOL N/A 03/13/2019   Procedure: ESOPHAGOGASTRODUODENOSCOPY (EGD) WITH PROPOFOL;  Surgeon: Jerene Bears, MD;  Location: [place];  Service: Gastroenterology;  Laterality: N/A;   ESOPHAGOGASTRODUODENOSCOPY (EGD) WITH PROPOFOL N/A 05/27/2020   Procedure: ESOPHAGOGASTRODUODENOSCOPY (EGD) WITH PROPOFOL;  Surgeon: [name], DO;  [facility];  Service: Endoscopy;  Laterality: N/A;   EYE SURGERY Bilateral    lazer   HEMOSTASIS CLIP PLACEMENT  03/13/2019   Procedure: HEMOSTASIS CLIP PLACEMENT;  Surgeon: Jerene Bears, MD;  Location: [facility] ENDOSCOPY;  Service: Gastroenterology;;   KNEE ARTHROSCOPY Left 10/25/2006   POLYPECTOMY  03/13/2019   Procedure: POLYPECTOMY;  Surgeon: Jerene Bears, MD;  Location: [place];  Service: Gastroenterology;;   RIGHT HEART CATH N/A 07/24/2017   Procedure: RIGHT HEART CATH;  Surgeon: Jolaine Artist, MD;  Location: [place] CV LAB;  Service: Cardiovascular;  Laterality: N/A;   RIGHT HEART CATHETERIZATION N/A 09/22/2013   Procedure: RIGHT HEART CATH;  Surgeon: Jolaine Artist, MD;  Location: [facility] CATH LAB;  Service: Cardiovascular;  Laterality: N/A;   SHOULDER ARTHROSCOPY WITH OPEN ROTATOR CUFF REPAIR Right 03/14/2014   Procedure: RIGHT SHOULDER ARTHROSCOPY WITH BICEPS RELEASE, OPEN SUBSCAPULA REPAIR, OPEN SUPRASPINATUS REPAIR.;  Surgeon: Meredith Pel, MD;  Location: [place];  Service: Orthopedics;  Laterality: Right;   TOE  AMPUTATION Right 02/24/2018   GREAT TOE AND 2ND TOE AMPUTATION   TUBAL LIGATION  1970's     Social History:   reports that she quit smoking about 24 years ago. Her smoking use included cigarettes. She has a 96.00 pack-year smoking history. She has never used smokeless tobacco. She reports that she does not currently use alcohol. She reports that she does not use drugs.   Family History:  Her family history includes Heart attack (age of onset: 62) in her mother.   Allergies Allergies  Allergen Reactions   Drug Ingredient [Cephalexin] Diarrhea    Pt reports heavy diarrhea with  use of keflex   Codeine Nausea And Vomiting and Other (See Comments)     Home Medications  Prior to Admission medications   Medication Sig Start Date End Date Taking? Authorizing Provider  acetaminophen (TYLENOL) 500 MG tablet Take 1,000 mg by mouth every 6 (six) hours as needed for moderate pain or headache.    [provider]  albuterol (VENTOLIN HFA) 108 (90 Base) MCG/ACT inhaler TAKE 2 PUFFS BY MOUTH EVERY 6 HOURS AS NEEDED FOR WHEEZE OR SHORTNESS OF BREATH 06/27/19   [place], [name], MD  Ascorbic Acid (VITAMIN C) 1000 MG tablet Take 1,000 mg by mouth daily.    [provider]  aspirin EC 81 MG tablet Take 1 tablet (81 mg total) by mouth daily with breakfast. 05/28/20   [place] Brick, Courage, MD  atorvastatin (LIPITOR) 80 MG tablet TAKE 1 TABLET BY MOUTH EVERY DAY 09/20/21   Bensimhon, Shaune Pascal, MD  carvedilol (COREG) 12.5 MG tablet TAKE 1 TABLET (12.5MG TOTAL) BY MOUTH TWICE A DAY WITH A MEAL 09/20/21   [name], MD  cholecalciferol (VITAMIN D) 1000 units tablet Take 1,000 Units by mouth daily with supper.     [provider]  clopidogrel (PLAVIX) 75 MG tablet TAKE 1 TABLET BY MOUTH EVERY DAY WITH BREAKFAST 07/22/21   Bensimhon, Shaune Pascal, MD  Continuous Blood Gluc Receiver (FREESTYLE LIBRE 2 READER) DEVI See admin instructions. 09/16/21   [provider]  Cranberry 250 MG TABS  Take 500 mg by mouth daily.    [provider]  diphenoxylate-atropine (LOMOTIL) 2.5-0.025 MG tablet Take 1 tablet by mouth 4 (four) times daily as needed for diarrhea or loose stools. 08/19/21   [name], MD  FERROUS SULFATE PO Take 325 mg by mouth 2 (two) times daily.    [provider]  guaiFENesin-dextromethorphan (ROBITUSSIN DM) 100-10 MG/5ML syrup Take 10 mLs by mouth every 4 (four) hours as needed for cough. 03/13/20   [place] Donnamarie Poag, DO  hydrocortisone cream 0.5 % Apply 1 application topically 2 (two) times daily. Patient taking differently: Apply 1 application  topically 2 (two) times daily. As needed 03/10/21   Florencia Reasons, MD  Insulin Disposable Pump (OMNIPOD DASH 5 PACK PODS) MISC Inject into the skin See admin instructions. Use continuously with Novolin R - change every 72 hours. 11/18/18   [provider]  insulin NPH Human (HUMULIN N,NOVOLIN N) 100 UNIT/ML injection Inject 15 Units into the skin See admin instructions. Inject 15 units subcutaneously in the morning if CBG >300;    [provider]  insulin regular (NOVOLIN R) 100 units/mL injection Inject into the skin See admin instructions. Manually add bolus to continuous dose via OmniPod 3 times daily per sliding scale (CBG 80-150 7 units, 151-200 9 units, 201-250 12 units, 251-300 14 units, 301- 400 17 units)    [provider]  isosorbide mononitrate (IMDUR) 30 MG 24 hr tablet Take 0.5 tablets (15 mg total) by mouth daily. 12/10/21   [place], Maricela Bo, FNP  levothyroxine (SYNTHROID, LEVOTHROID) 50 MCG tablet Take 1 tablet (50 mcg total) by mouth daily before breakfast. 11/07/16   Dena Billet B, PA-C  magnesium oxide (MAG-OX) 400 (240 Mg) MG tablet Take 1 tablet (400 mg total) by mouth daily. 07/18/21   [name], MD  metolazone (ZAROXOLYN) 2.5 MG tablet Take 1 tablet (2.5 mg total) by mouth as directed. Every other week 12/10/21 03/10/22  Rafael Bihari, FNP  Multiple  Vitamins-Minerals (AIRBORNE GUMMIES  PO) Take 2 tablets by mouth every evening.    [provider]  Multiple Vitamins-Minerals (CENTRUM SILVER 50+WOMEN) TABS Take 1 tablet by mouth every evening.    [provider]  nitroGLYCERIN (NITROSTAT) 0.4 MG SL tablet PLACE 1 TABLET (0.4 MG TOTAL) UNDER THE TONGUE EVERY 5 (FIVE) MINUTES AS NEEDED FOR CHEST PAIN. 02/06/21   Larey Dresser, MD  [facility] VERIO test strip 3 (three) times daily. 07/25/20   [provider]  polyethylene glycol (MIRALAX / GLYCOLAX) 17 g packet Take 17 g by mouth as needed for mild constipation.    [provider]  pregabalin (LYRICA) 150 MG capsule Take 150 mg by mouth 2 (two) times daily. 03/20/21   [provider]  sitaGLIPtin (JANUVIA) 25 MG tablet Take 1 tablet (25 mg total) by mouth daily. 03/12/21   [name], MD  torsemide (DEMADEX) 20 MG tablet Take 4 tablets (80 mg total) by mouth 2 (two) times daily. 12/10/21   [place], Maricela Bo, FNP  TRINTELLIX 10 MG TABS tablet TAKE 1 TABLET BY MOUTH EVERY DAY 10/22/21   [name], MD  ursodiol (ACTIGALL) 500 MG tablet Take 500 mg by mouth 3 (three) times daily. 12/11/20   [provider]  vitamin B-12 (CYANOCOBALAMIN) 1000 MCG tablet Take 1,000 mcg by mouth daily.    [provider]  zinc gluconate 50 MG tablet Take 50 mg by mouth daily.    [provider]     Critical care time: 43 minutes necessary for DKA with pH 7.0, altered mental status, and hypotension     Georgann Housekeeper, AGACNP-BC [place] for personal pager PCCM on call pager [phone] until 7pm. Please call Elink 7p-7a. [phone]  01/14/2022 8:56 PM

## 2022-01-14 NOTE — Progress Notes (Signed)
eLink Physician-Brief Progress Note Patient Name: Susan [name] DOB: May 18, 1951 MRN: [id number]   Date of Service  01/14/2022  HPI/Events of Note  Patient admitted with DKA, hypovolemic shock, and AKI.  eICU Interventions  New Patient Evaluation.        Frederik Pear 01/14/2022, 10:51 PM

## 2022-01-14 NOTE — ED Provider Notes (Signed)
.  Critical Care  Performed by: Lianne Cure, DO Authorized by: Lianne Cure, DO   Critical care provider statement:    Critical care time (minutes):  100   Critical care was necessary to treat or prevent imminent or life-threatening deterioration of the following conditions:  Metabolic crisis (DKA)   Critical care was time spent personally by me on the following activities:  Development of treatment plan with patient or surrogate, discussions with consultants, evaluation of patient's response to treatment, examination of patient, ordering and review of laboratory studies, ordering and review of radiographic studies, ordering and performing treatments and interventions, pulse oximetry, re-evaluation of patient's condition and review of old charts .Central Line  Date/Time: 01/14/2022 8:50 PM  Performed by: Lianne Cure, DO Authorized by: Lianne Cure, DO   Consent:    Consent obtained:  Verbal   Consent given by:  Patient   Risks, benefits, and alternatives were discussed: yes     Risks discussed:  Infection, incorrect placement, bleeding and arterial puncture Universal protocol:    Immediately prior to procedure, a time out was called: yes     Patient identity confirmed:  Verbally with patient, arm band and provided demographic data Pre-procedure details:    Indication(s): central venous access and hemodynamic monitoring     Hand hygiene: Hand hygiene performed prior to insertion     Sterile barrier technique: All elements of maximal sterile technique followed     Skin preparation:  Chlorhexidine   Skin preparation agent: Skin preparation agent completely dried prior to procedure   Sedation:    Sedation type:  None Anesthesia:    Anesthesia method:  Local infiltration Procedure details:    Location:  L femoral   Patient position:  Supine   Procedural supplies:  Triple lumen   Landmarks identified: yes     Ultrasound guidance: yes     Sterile ultrasound techniques: Sterile  gel and sterile probe covers were used     Number of attempts:  2 Post-procedure details:    Post-procedure:  Dressing applied and line sutured   Assessment:  Blood return through all ports and free fluid flow   Procedure completion:  Tolerated well, no immediate complications      Lianne Cure, DO 65/68/12 2051

## 2022-01-15 ENCOUNTER — Inpatient Hospital Stay (HOSPITAL_COMMUNITY): Payer: HMO

## 2022-01-15 LAB — GLUCOSE, CAPILLARY
Glucose-Capillary: 140 mg/dL — ABNORMAL HIGH (ref 70–99)
Glucose-Capillary: 150 mg/dL — ABNORMAL HIGH (ref 70–99)
Glucose-Capillary: 177 mg/dL — ABNORMAL HIGH (ref 70–99)
Glucose-Capillary: 222 mg/dL — ABNORMAL HIGH (ref 70–99)
Glucose-Capillary: 236 mg/dL — ABNORMAL HIGH (ref 70–99)
Glucose-Capillary: 275 mg/dL — ABNORMAL HIGH (ref 70–99)
Glucose-Capillary: 307 mg/dL — ABNORMAL HIGH (ref 70–99)
Glucose-Capillary: 330 mg/dL — ABNORMAL HIGH (ref 70–99)
Glucose-Capillary: 399 mg/dL — ABNORMAL HIGH (ref 70–99)
Glucose-Capillary: 399 mg/dL — ABNORMAL HIGH (ref 70–99)
Glucose-Capillary: 454 mg/dL — ABNORMAL HIGH (ref 70–99)
Glucose-Capillary: 554 mg/dL (ref 70–99)
Glucose-Capillary: >600 mg/dL (ref 70–99)
Glucose-Capillary: >600 mg/dL (ref 70–99)
Glucose-Capillary: >600 mg/dL (ref 70–99)
Glucose-Capillary: >600 mg/dL (ref 70–99)
Glucose-Capillary: >600 mg/dL (ref 70–99)
Glucose-Capillary: >600 mg/dL (ref 70–99)
Glucose-Capillary: >600 mg/dL (ref 70–99)
Glucose-Capillary: >600 mg/dL (ref 70–99)
Glucose-Capillary: >600 mg/dL (ref 70–99)
Glucose-Capillary: >600 mg/dL (ref 70–99)
Glucose-Capillary: >600 mg/dL (ref 70–99)
Glucose-Capillary: >600 mg/dL (ref 70–99)
Glucose-Capillary: >600 mg/dL (ref 70–99)
Glucose-Capillary: >600 mg/dL (ref 70–99)
Glucose-Capillary: >600 mg/dL (ref 70–99)
Glucose-Capillary: >600 mg/dL (ref 70–99)
Glucose-Capillary: >600 mg/dL (ref 70–99)
Glucose-Capillary: >600 mg/dL (ref 70–99)
Glucose-Capillary: >600 mg/dL (ref 70–99)

## 2022-01-15 LAB — BASIC METABOLIC PANEL
Anion gap: 19 — ABNORMAL HIGH (ref 5–15)
Anion gap: 17 — ABNORMAL HIGH (ref 5–15)
Anion gap: 17 — ABNORMAL HIGH (ref 5–15)
Anion gap: 16 — ABNORMAL HIGH (ref 5–15)
Anion gap: 17 — ABNORMAL HIGH (ref 5–15)
Anion gap: 17 — ABNORMAL HIGH (ref 5–15)
BUN: 93 mg/dL — ABNORMAL HIGH (ref 8–23)
BUN: 96 mg/dL — ABNORMAL HIGH (ref 8–23)
BUN: 97 mg/dL — ABNORMAL HIGH (ref 8–23)
BUN: 87 mg/dL — ABNORMAL HIGH (ref 8–23)
BUN: 93 mg/dL — ABNORMAL HIGH (ref 8–23)
BUN: 93 mg/dL — ABNORMAL HIGH (ref 8–23)
CO2: 18 mmol/L — ABNORMAL LOW (ref 22–32)
CO2: 23 mmol/L (ref 22–32)
CO2: 22 mmol/L (ref 22–32)
CO2: 21 mmol/L — ABNORMAL LOW (ref 22–32)
CO2: 21 mmol/L — ABNORMAL LOW (ref 22–32)
CO2: 23 mmol/L (ref 22–32)
Calcium: 7.7 mg/dL — ABNORMAL LOW (ref 8.9–10.3)
Calcium: 8.1 mg/dL — ABNORMAL LOW (ref 8.9–10.3)
Calcium: 8.3 mg/dL — ABNORMAL LOW (ref 8.9–10.3)
Calcium: 7.5 mg/dL — ABNORMAL LOW (ref 8.9–10.3)
Calcium: 8.1 mg/dL — ABNORMAL LOW (ref 8.9–10.3)
Calcium: 8.4 mg/dL — ABNORMAL LOW (ref 8.9–10.3)
Chloride: 80 mmol/L — ABNORMAL LOW (ref 98–111)
Chloride: 77 mmol/L — ABNORMAL LOW (ref 98–111)
Chloride: 79 mmol/L — ABNORMAL LOW (ref 98–111)
Chloride: 85 mmol/L — ABNORMAL LOW (ref 98–111)
Chloride: 90 mmol/L — ABNORMAL LOW (ref 98–111)
Chloride: 88 mmol/L — ABNORMAL LOW (ref 98–111)
Creatinine, Ser: 5.15 mg/dL — ABNORMAL HIGH (ref 0.44–1.00)
Creatinine, Ser: 5.2 mg/dL — ABNORMAL HIGH (ref 0.44–1.00)
Creatinine, Ser: 5.14 mg/dL — ABNORMAL HIGH (ref 0.44–1.00)
Creatinine, Ser: 4.21 mg/dL — ABNORMAL HIGH (ref 0.44–1.00)
Creatinine, Ser: 4.66 mg/dL — ABNORMAL HIGH (ref 0.44–1.00)
Creatinine, Ser: 4.42 mg/dL — ABNORMAL HIGH (ref 0.44–1.00)
GFR, Estimated: 8 mL/min — ABNORMAL LOW (ref >60)
GFR, Estimated: 8 mL/min — ABNORMAL LOW (ref >60)
GFR, Estimated: 8 mL/min — ABNORMAL LOW (ref >60)
GFR, Estimated: 11 mL/min — ABNORMAL LOW (ref >60)
GFR, Estimated: 9 mL/min — ABNORMAL LOW (ref >60)
GFR, Estimated: 10 mL/min — ABNORMAL LOW (ref >60)
Glucose, Bld: 1127 mg/dL (ref 70–99)
Glucose, Bld: 1054 mg/dL (ref 70–99)
Glucose, Bld: 836 mg/dL (ref 70–99)
Glucose, Bld: 366 mg/dL — ABNORMAL HIGH (ref 70–99)
Glucose, Bld: 500 mg/dL — ABNORMAL HIGH (ref 70–99)
Glucose, Bld: 142 mg/dL — ABNORMAL HIGH (ref 70–99)
Potassium: 3.7 mmol/L (ref 3.5–5.1)
Potassium: 4 mmol/L (ref 3.5–5.1)
Potassium: 4 mmol/L (ref 3.5–5.1)
Potassium: 3.2 mmol/L — ABNORMAL LOW (ref 3.5–5.1)
Potassium: 3.6 mmol/L (ref 3.5–5.1)
Potassium: 3.5 mmol/L (ref 3.5–5.1)
Sodium: 117 mmol/L — CL (ref 135–145)
Sodium: 117 mmol/L — CL (ref 135–145)
Sodium: 118 mmol/L — CL (ref 135–145)
Sodium: 123 mmol/L — ABNORMAL LOW (ref 135–145)
Sodium: 127 mmol/L — ABNORMAL LOW (ref 135–145)
Sodium: 128 mmol/L — ABNORMAL LOW (ref 135–145)

## 2022-01-15 LAB — URINALYSIS, ROUTINE W REFLEX MICROSCOPIC
Bilirubin Urine: NEGATIVE
Glucose, UA: >=500 mg/dL — AB
Ketones, ur: 5 mg/dL — AB
Nitrite: NEGATIVE
Protein, ur: 100 mg/dL — AB
Specific Gravity, Urine: 1.017 (ref 1.005–1.030)
WBC, UA: >50 WBC/hpf — ABNORMAL HIGH (ref 0–5)
pH: 5 (ref 5.0–8.0)

## 2022-01-15 LAB — POCT I-STAT 7, (LYTES, BLD GAS, ICA,H+H)
Acid-base deficit: 6 mmol/L — ABNORMAL HIGH (ref 0.0–2.0)
Acid-base deficit: 3 mmol/L — ABNORMAL HIGH (ref 0.0–2.0)
Bicarbonate: 20.1 mmol/L (ref 20.0–28.0)
Bicarbonate: 20.4 mmol/L (ref 20.0–28.0)
Calcium, Ion: 1.09 mmol/L — ABNORMAL LOW (ref 1.15–1.40)
Calcium, Ion: 1.09 mmol/L — ABNORMAL LOW (ref 1.15–1.40)
HCT: 39 % (ref 36.0–46.0)
HCT: 37 % (ref 36.0–46.0)
Hemoglobin: 13.3 g/dL (ref 12.0–15.0)
Hemoglobin: 12.6 g/dL (ref 12.0–15.0)
O2 Saturation: 91 %
O2 Saturation: 94 %
Patient temperature: 99
Potassium: 4.1 mmol/L (ref 3.5–5.1)
Potassium: 3.5 mmol/L (ref 3.5–5.1)
Sodium: 116 mmol/L — CL (ref 135–145)
Sodium: 122 mmol/L — ABNORMAL LOW (ref 135–145)
TCO2: 21 mmol/L — ABNORMAL LOW (ref 22–32)
TCO2: 21 mmol/L — ABNORMAL LOW (ref 22–32)
pCO2 arterial: 41 mmHg (ref 32–48)
pCO2 arterial: 32 mmHg (ref 32–48)
pH, Arterial: 7.299 — ABNORMAL LOW (ref 7.35–7.45)
pH, Arterial: 7.412 (ref 7.35–7.45)
pO2, Arterial: 67 mmHg — ABNORMAL LOW (ref 83–108)
pO2, Arterial: 67 mmHg — ABNORMAL LOW (ref 83–108)

## 2022-01-15 LAB — MRSA NEXT GEN BY PCR, NASAL: MRSA by PCR Next Gen: NOT DETECTED

## 2022-01-15 LAB — PROCALCITONIN
Procalcitonin: 4.79 ng/mL
Procalcitonin: 13.89 ng/mL

## 2022-01-15 LAB — BETA-HYDROXYBUTYRIC ACID
Beta-Hydroxybutyric Acid: 2.75 mmol/L — ABNORMAL HIGH (ref 0.05–0.27)
Beta-Hydroxybutyric Acid: 0.07 mmol/L (ref 0.05–0.27)

## 2022-01-15 LAB — LACTIC ACID, PLASMA: Lactic Acid, Venous: 3.2 mmol/L (ref 0.5–1.9)

## 2022-01-15 LAB — BRAIN NATRIURETIC PEPTIDE: B Natriuretic Peptide: 1929.8 pg/mL — ABNORMAL HIGH (ref 0.0–100.0)

## 2022-01-15 LAB — AMMONIA: Ammonia: 14 umol/L (ref 9–35)

## 2022-01-15 MED ORDER — ORAL CARE MOUTH RINSE
15.0000 mL | OROMUCOSAL | Status: DC
  Administered 2022-01-16 – 2022-01-20 (×16): 15 mL via OROMUCOSAL

## 2022-01-15 MED ORDER — NOREPINEPHRINE 4 MG/250ML-% IV SOLN
0.0000 ug/min | INTRAVENOUS | Status: DC
  Administered 2022-01-15: 2 ug/min via INTRAVENOUS
  Administered 2022-01-15: 10 ug/min via INTRAVENOUS
  Administered 2022-01-15: 13 ug/min via INTRAVENOUS
  Filled 2022-01-15 (×3): qty 250

## 2022-01-15 MED ORDER — SODIUM CHLORIDE 0.9 % IV SOLN: INTRAVENOUS | Status: DC | PRN

## 2022-01-15 MED ORDER — SODIUM CHLORIDE 0.9 % IV SOLN
1.0000 g | Freq: Two times a day (BID) | INTRAVENOUS | Status: AC
  Administered 2022-01-15 – 2022-01-17 (×6): 1 g via INTRAVENOUS
  Filled 2022-01-15 (×6): qty 20

## 2022-01-15 MED ORDER — LACTATED RINGERS IV BOLUS
1000.0000 mL | Freq: Once | INTRAVENOUS | Status: AC
  Administered 2022-01-15: 1000 mL via INTRAVENOUS

## 2022-01-15 MED ORDER — CHLORHEXIDINE GLUCONATE CLOTH 2 % EX PADS
6.0000 | MEDICATED_PAD | Freq: Every day | CUTANEOUS | Status: DC
  Administered 2022-01-15 – 2022-01-18 (×4): 6 via TOPICAL

## 2022-01-15 MED ORDER — SODIUM CHLORIDE 0.9% FLUSH
10.0000 mL | Freq: Two times a day (BID) | INTRAVENOUS | Status: DC
  Administered 2022-01-15 – 2022-01-17 (×6): 10 mL

## 2022-01-15 MED ORDER — ACETAMINOPHEN 650 MG RE SUPP
650.0000 mg | RECTAL | Status: DC | PRN
Start: 2022-01-15 — End: 2022-01-16
  Administered 2022-01-16: 650 mg via RECTAL
  Filled 2022-01-15: qty 1

## 2022-01-15 MED ORDER — SODIUM CHLORIDE 0.9 % IV SOLN: 1.0000 g | INTRAVENOUS | Status: DC

## 2022-01-15 MED ORDER — SODIUM CHLORIDE 0.9% FLUSH: 10.0000 mL | INTRAVENOUS | Status: DC | PRN

## 2022-01-15 MED ORDER — ORAL CARE MOUTH RINSE: 15.0000 mL | OROMUCOSAL | Status: DC | PRN

## 2022-01-15 MED ORDER — LACTATED RINGERS IV BOLUS
500.0000 mL | Freq: Once | INTRAVENOUS | Status: AC
  Administered 2022-01-15: 500 mL via INTRAVENOUS

## 2022-01-15 NOTE — Progress Notes (Signed)
Attempted I&O catheterization in order to obtain urine sample for culture. Once catheter was passed into bladder pt only voided ~5cc of dark amber urine with heavy sediment. Bladder scan performed. Bladder scan showed 1cc urine in bladder. Will continue to perform routine scans to assess for urine production and will attempt I&O when bladder volume is sufficient.

## 2022-01-15 NOTE — Progress Notes (Addendum)
Inpatient Diabetes Program Recommendations  AACE/ADA: New Consensus Statement on Inpatient Glycemic Control (2015)  Target Ranges:  Prepandial:   less than 140 mg/dL      Peak postprandial:   less than 180 mg/dL (1-2 hours)      Critically ill patients:  140 - 180 mg/dL   Lab Results  Component Value Date   GLUCAP >600 (HH) 01/15/2022   HGBA1C 12.2 (H) 03/05/2021    Review of Glycemic Control  Latest Reference Range & Units 01/15/22 06:10 01/15/22 07:27 01/15/22 08:05 01/15/22 08:32  Glucose-Capillary 70 - 99 mg/dL >600 (HH) >600 (HH) >600 (HH) >600 (HH)   Diabetes history:  DM  Outpatient Diabetes medications:  Omnipod (unsure of settings) Current orders for Inpatient glycemic control:  IV insulin-  Inpatient Diabetes Program Recommendations:    Last lab glucose was >1000 mg/dL.  DKA order set in place.  Will follow.   Thanks,  Adah Perl, RN, BC-ADM Inpatient Diabetes Coordinator Pager [phone]  (8a-5p)

## 2022-01-15 NOTE — Progress Notes (Signed)
eLink Physician-Brief Progress Note Patient Name: Teara Duerksen DOB: 05-20-1951 MRN: [id number]   Date of Service  01/15/2022  HPI/Events of Note  Patient with hypotension, BP 68/33, CVP 19  eICU Interventions  Norepinephrine  gtt started with prompt recovery of her blood pressure.        Kerry Kass Seana Underwood 01/15/2022, 1:52 AM

## 2022-01-15 NOTE — TOC Progression Note (Signed)
Transition of Care [facility]) - Progression Note    Patient Details  Name: Susan [name] MRN: [id number] Date of Birth: 16-Jul-1950  Transition of Care [facility]) CM/SW Contact  Angelita Ingles, RN Phone Number:[phone]  01/15/2022, 9:22 AM  Clinical Narrative:     Transition of Care [facility]) Screening Note   Patient Details  Name: Susan [name] Date of Birth: [birth year]   Transition of Care [facility]) CM/SW Contact:    Angelita Ingles, RN Phone Number: 01/15/2022, 9:22 AM    Transition of Care Department [facility]) has reviewed patient and no TOC needs have been identified at this time. We will continue to monitor patient advancement through interdisciplinary progression rounds. If new patient transition needs arise, please place a TOC consult.          Expected Discharge Plan and Services                                                 Social Determinants of Health (SDOH) Interventions    Readmission Risk Interventions    05/28/2020   12:50 PM 03/12/2020    2:48 PM 02/09/2020   12:30 PM  Readmission Risk Prevention Plan  Transportation Screening Complete Complete Complete  Medication Review Press photographer) Complete Complete Complete  PCP or Specialist appointment within 3-5 days of discharge Complete Complete Complete  HRI or Home Care Consult Complete Complete Complete  SW Recovery Care/Counseling Consult Complete Complete Complete  Palliative Care Screening Not Applicable Not Applicable Not [place] Not Applicable Not Applicable Not Applicable

## 2022-01-15 NOTE — Progress Notes (Signed)
D/t concerns of fluid volume overload given poor EF and elevated CVP, orders given to stop 1L bolus and hold maintenance fluids.

## 2022-01-15 NOTE — Progress Notes (Signed)
eLink Physician-Brief Progress Note Patient Name: Susan [name] DOB: 05/21/1951 MRN: [id number]   Date of Service  01/15/2022  HPI/Events of Note  Patient has received 3 liters of iv fluids, BP 83/60, EF 25 %, lungs currently clear, she is on the DKA protocol but there is uncertainty about her volume status in the context of her known cardiomyopathy, she is oliguric.  eICU Interventions  Will check CVP and BNP to try to better estimate her current volume status.        Kerry Kass Makinzee Durley 01/15/2022, 12:42 AM

## 2022-01-15 NOTE — Progress Notes (Signed)
eLink Physician-Brief Progress Note Patient Name: Susan [name] DOB: 12/20/1950 MRN: [id number]   Date of Service  01/15/2022  HPI/Events of Note  Patient is less alert but she is maintaining her saturation and blood pressure, she is oliguric, breath sounds are more coarse.  eICU Interventions  Stat portable CXR and ABG ordered.        Kerry Kass Nisa Decaire 01/15/2022, 5:03 AM

## 2022-01-15 NOTE — Progress Notes (Addendum)
NAME:  Susan [name], MRN:  [id number], DOB:  1950/06/03, LOS: 1 ADMISSION DATE:  01/14/2022, CONSULTATION DATE:  8/22 REFERRING MD:  Dr. Wallace Cullens EDP, CHIEF COMPLAINT:  DKA   History of Present Illness:  71 year old female with PMH as below, which is significant for CAD (s/p PCI in 2000 and 2007), HFrEF (LVEF 30-35%), CKD IV with baseline creatinine 3.5, uncontrolled DM 2, HTN, and LLE venous ulcer. She presented to [name] ED 8/22 with complaints of N/V/D, abdominal pain, and chest pain. CBG in triage noted at > 600. Reported symptoms for the past few weeks. Has not had water at home for the past 3 days. Has been non-compliant with insulin for 3 days PTA as well. She was lethargic, but arousable upon being roomed. Laboratory evaluation is significant for Glucose > 12000, K 6, Creatinine 5.09, BUN 96, venous pH 7.07. The patient was given 500cc bolus, started on an insulin infusion, and PCCM was asked to admit.   Pertinent  Medical History  HFrEF (EF 25-30% 7/23) CAD s/p PCI in 2000 and 2007 CKD stage IV Type 2 DM  Significant Hospital Events: Including procedures, antibiotic start and stop dates in addition to other pertinent events   8/22 Admitted to ICU  Interim History / Subjective:  Oliguric, hypotension with norepinephrine started. Worsening mentation overnight.  Subjective: Responds to painful stimuli.   Objective   Blood pressure (!) 71/42, pulse 67, temperature 97.8 F (36.6 C), temperature source Axillary, resp. rate 20, height 5\' 6"  (1.676 m), weight 108.2 kg, SpO2 99 %. 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 She is hypovolemic. Received 2L of fluid and maintenance fluid discontinued overnight with concern for volume overload. CXR showed no pulmonary edema, BNP elevated at 1000. She needs additional fluids to help with DKA. -1L bolus -Maintenance LR 125 cc/hr -Endo tool -BMP q 4 hours  Hypotension Likely from hypovolemic 2/2 to volume depletion from DKA. -bolus LR -Maintenance fluids 125 cc - levophed with MAP goal >65  Acute metabolic encephalopathy Worsening mentation overnight likely from elevated blood sugar. This could be worsened by sepsis. With her recurrent UTI, currently treating for UTI and covering for klebsiella. Abdominal ultrasound showed slightly distended gallbladder with sludge and stones, wall thickening present. -treating DKA -continue meropenem  ?UTI Previous cultures with klebsiella resistant to ampicillin and enterobacter resistant to cefazolin, cefepime, pip/tazo, and bactrim. Both sensitive to cipro. UA with moderate Hgb, leukocytes, WBC, and bacteria. -merrem -Urine culture pending -Blood culture pending  Chronic HFrEF -  Telemetry monitoring.  - holding home atorvastatin, asas, carvedilol, clopidogrel, isosorbide, metolazone, sitagliptin while NPO, hypotensive.   CAD PCI in 2000 and 2007. Troponin were 50-86 and EKFG showed RBBB that was present on  prior.  Oliguic AKI in CKD stage IV Baseline creatinine 3.5, elevated to 5.2 on admission.  -Strict I and Os -foley catheter in place  Nocturnal hypoxia -continue 2L qhs  Pseudohyponatremia Corrects to 132 with glucose.  Best Practice (right click and "Reselect all SmartList Selections" daily)   Diet/type: NPO DVT prophylaxis: prophylactic heparin  GI prophylaxis: N/A Lines: Central line Foley:  Yes, and it is still needed Code Status:  full code Last date of multidisciplinary goals of care discussion [daughter updated at bedside 8/23]  Kaliyah Gladman M. Marthena Whitmyer, D.O.  Internal Medicine Resident, PGY-2 [name]  6:31 AM, 01/15/2022

## 2022-01-15 NOTE — Progress Notes (Signed)
Pharmacy Antibiotic Note  Susan [name] is a 71 y.o. female admitted with DKA on 01/14/2022 with concern for sepsis and UTI.  Pharmacy has been consulted for Meropenem dosing. Patient has a history of ESBL Enterobacter bacteremia in the past.    WBC is elevated with left shift noted.  SCR elevated - 5.29 >> 5.14. Making little bit of urine.  Hypothermic on admission - now normothermic.  PCT elevated at 13.89. Lactic acid 3.2.   Plan: Meropenem 1000 mg IV every 12 hours.  Monitor urine output and SCR - may need to adjust further.  Monitor culture results and clinical status.    Height: '5\' 6"'$  (167.6 cm) Weight: 108.2 kg (238 lb 8.6 oz) IBW/kg (Calculated) : 59.3  Temp (24hrs), Avg:98.1 F (36.7 C), Min:97.4 F (36.3 C), Max:99 F (37.2 C)  Recent Labs  Lab 01/14/22 1650 01/14/22 1654 01/14/22 2245 01/15/22 0132 01/15/22 0135 01/15/22 0449 01/15/22 0920  WBC 14.6*  --   --   --   --   --   --   CREATININE 5.09* 4.50* 5.29*  --  5.15* 5.20* 5.14*  LATICACIDVEN 3.2*  --  3.1* 3.2*  --   --   --     Estimated Creatinine Clearance: 12.3 mL/min (A) (by C-G formula based on SCr of 5.14 mg/dL (H)).    Allergies  Allergen Reactions   Drug Ingredient [Cephalexin] Diarrhea    Pt reports heavy diarrhea with use of keflex   Codeine Nausea And Vomiting and Other (See Comments)    Antimicrobials this admission: Meropenem 8/23 >>  Dose adjustments this admission:   Microbiology results: 8/23 BCx: pending 8/23 UCx: pending  8/23 MRSA PCR: negative  Thank you for allowing pharmacy to be a part of this patient's care.  Sloan Leiter, PharmD, BCPS, BCCCP Clinical Pharmacist Please refer to [facility] for [facility] numbers 01/15/2022 10:22 AM

## 2022-01-16 ENCOUNTER — Inpatient Hospital Stay (HOSPITAL_COMMUNITY): Payer: HMO

## 2022-01-16 DIAGNOSIS — R4182 Altered mental status, unspecified: Secondary | ICD-10-CM

## 2022-01-16 DIAGNOSIS — E1110 Type 2 diabetes mellitus with ketoacidosis without coma: Secondary | ICD-10-CM | POA: Diagnosis not present

## 2022-01-16 LAB — BASIC METABOLIC PANEL
Anion gap: 15 (ref 5–15)
Anion gap: 14 (ref 5–15)
Anion gap: 2 — ABNORMAL LOW (ref 5–15)
Anion gap: 13 (ref 5–15)
BUN: 93 mg/dL — ABNORMAL HIGH (ref 8–23)
BUN: 92 mg/dL — ABNORMAL HIGH (ref 8–23)
BUN: 87 mg/dL — ABNORMAL HIGH (ref 8–23)
BUN: 93 mg/dL — ABNORMAL HIGH (ref 8–23)
CO2: 22 mmol/L (ref 22–32)
CO2: 23 mmol/L (ref 22–32)
CO2: 21 mmol/L — ABNORMAL LOW (ref 22–32)
CO2: 22 mmol/L (ref 22–32)
Calcium: 8.1 mg/dL — ABNORMAL LOW (ref 8.9–10.3)
Calcium: 8.2 mg/dL — ABNORMAL LOW (ref 8.9–10.3)
Calcium: 7.6 mg/dL — ABNORMAL LOW (ref 8.9–10.3)
Calcium: 8.3 mg/dL — ABNORMAL LOW (ref 8.9–10.3)
Chloride: 89 mmol/L — ABNORMAL LOW (ref 98–111)
Chloride: 89 mmol/L — ABNORMAL LOW (ref 98–111)
Chloride: 99 mmol/L (ref 98–111)
Chloride: 94 mmol/L — ABNORMAL LOW (ref 98–111)
Creatinine, Ser: 4.27 mg/dL — ABNORMAL HIGH (ref 0.44–1.00)
Creatinine, Ser: 4.25 mg/dL — ABNORMAL HIGH (ref 0.44–1.00)
Creatinine, Ser: 3.66 mg/dL — ABNORMAL HIGH (ref 0.44–1.00)
Creatinine, Ser: 3.92 mg/dL — ABNORMAL HIGH (ref 0.44–1.00)
GFR, Estimated: 10 mL/min — ABNORMAL LOW (ref >60)
GFR, Estimated: 11 mL/min — ABNORMAL LOW (ref >60)
GFR, Estimated: 13 mL/min — ABNORMAL LOW (ref >60)
GFR, Estimated: 12 mL/min — ABNORMAL LOW (ref >60)
Glucose, Bld: 187 mg/dL — ABNORMAL HIGH (ref 70–99)
Glucose, Bld: 184 mg/dL — ABNORMAL HIGH (ref 70–99)
Glucose, Bld: 205 mg/dL — ABNORMAL HIGH (ref 70–99)
Glucose, Bld: 193 mg/dL — ABNORMAL HIGH (ref 70–99)
Potassium: 3 mmol/L — ABNORMAL LOW (ref 3.5–5.1)
Potassium: 2.8 mmol/L — ABNORMAL LOW (ref 3.5–5.1)
Potassium: >7.5 mmol/L (ref 3.5–5.1)
Potassium: 3.9 mmol/L (ref 3.5–5.1)
Sodium: 126 mmol/L — ABNORMAL LOW (ref 135–145)
Sodium: 126 mmol/L — ABNORMAL LOW (ref 135–145)
Sodium: 122 mmol/L — ABNORMAL LOW (ref 135–145)
Sodium: 129 mmol/L — ABNORMAL LOW (ref 135–145)

## 2022-01-16 LAB — CBC
HCT: 31.8 % — ABNORMAL LOW (ref 36.0–46.0)
Hemoglobin: 11.9 g/dL — ABNORMAL LOW (ref 12.0–15.0)
MCH: 27.7 pg (ref 26.0–34.0)
MCHC: 37.4 g/dL — ABNORMAL HIGH (ref 30.0–36.0)
MCV: 74.1 fL — ABNORMAL LOW (ref 80.0–100.0)
Platelets: 119 10*3/uL — ABNORMAL LOW (ref 150–400)
RBC: 4.29 MIL/uL (ref 3.87–5.11)
RDW: 17 % — ABNORMAL HIGH (ref 11.5–15.5)
WBC: 9.5 10*3/uL (ref 4.0–10.5)
nRBC: 0 % (ref 0.0–0.2)

## 2022-01-16 LAB — TROPONIN I (HIGH SENSITIVITY)
Troponin I (High Sensitivity): 837 ng/L (ref <18)
Troponin I (High Sensitivity): 898 ng/L (ref <18)

## 2022-01-16 LAB — CK: Total CK: 114 U/L (ref 38–234)

## 2022-01-16 LAB — URINE CULTURE

## 2022-01-16 LAB — GLUCOSE, CAPILLARY
Glucose-Capillary: 182 mg/dL — ABNORMAL HIGH (ref 70–99)
Glucose-Capillary: 194 mg/dL — ABNORMAL HIGH (ref 70–99)
Glucose-Capillary: 216 mg/dL — ABNORMAL HIGH (ref 70–99)
Glucose-Capillary: 146 mg/dL — ABNORMAL HIGH (ref 70–99)

## 2022-01-16 LAB — MAGNESIUM: Magnesium: 1.4 mg/dL — ABNORMAL LOW (ref 1.7–2.4)

## 2022-01-16 LAB — PROCALCITONIN: Procalcitonin: 17.95 ng/mL

## 2022-01-16 LAB — PHOSPHORUS: Phosphorus: 3.6 mg/dL (ref 2.5–4.6)

## 2022-01-16 MED ORDER — HEPARIN (PORCINE) 25000 UT/250ML-% IV SOLN
2200.0000 [IU]/h | INTRAVENOUS | Status: DC
  Administered 2022-01-16: 1000 [IU]/h via INTRAVENOUS
  Administered 2022-01-17: 1300 [IU]/h via INTRAVENOUS
  Administered 2022-01-18: 1900 [IU]/h via INTRAVENOUS
  Administered 2022-01-18: 2050 [IU]/h via INTRAVENOUS
  Administered 2022-01-19: 2200 [IU]/h via INTRAVENOUS
  Filled 2022-01-16 (×5): qty 250

## 2022-01-16 MED ORDER — ASPIRIN 300 MG RE SUPP
150.0000 mg | Freq: Every day | RECTAL | Status: DC
  Administered 2022-01-17: 150 mg via RECTAL
  Filled 2022-01-16: qty 1

## 2022-01-16 MED ORDER — ACETAMINOPHEN 325 MG PO TABS
650.0000 mg | ORAL_TABLET | Freq: Four times a day (QID) | ORAL | Status: DC | PRN
  Administered 2022-01-16 – 2022-01-21 (×10): 650 mg via ORAL
  Filled 2022-01-16 (×11): qty 2

## 2022-01-16 MED ORDER — POTASSIUM CHLORIDE 10 MEQ/50ML IV SOLN
10.0000 meq | INTRAVENOUS | Status: AC
  Administered 2022-01-16 (×3): 10 meq via INTRAVENOUS
  Filled 2022-01-16 (×3): qty 50

## 2022-01-16 MED ORDER — POTASSIUM CHLORIDE 10 MEQ/50ML IV SOLN: 10.0000 meq | INTRAVENOUS | Status: DC

## 2022-01-16 MED ORDER — LACTATED RINGERS IV SOLN: INTRAVENOUS | Status: AC

## 2022-01-16 MED ORDER — MAGNESIUM SULFATE 2 GM/50ML IV SOLN: 2.0000 g | Freq: Once | INTRAVENOUS | Status: DC

## 2022-01-16 MED ORDER — MAGNESIUM SULFATE 4 GM/100ML IV SOLN
4.0000 g | Freq: Once | INTRAVENOUS | Status: AC
  Administered 2022-01-16: 4 g via INTRAVENOUS
  Filled 2022-01-16: qty 100

## 2022-01-16 MED ORDER — METOPROLOL TARTRATE 5 MG/5ML IV SOLN
INTRAVENOUS | Status: AC
  Administered 2022-01-16: 5 mg via INTRAVENOUS
  Filled 2022-01-16: qty 5

## 2022-01-16 MED ORDER — INSULIN DETEMIR 100 UNIT/ML ~~LOC~~ SOLN
15.0000 [IU] | Freq: Two times a day (BID) | SUBCUTANEOUS | Status: DC
  Administered 2022-01-16 – 2022-01-23 (×15): 15 [IU] via SUBCUTANEOUS
  Filled 2022-01-16 (×19): qty 0.15

## 2022-01-16 MED ORDER — ASPIRIN 300 MG RE SUPP
300.0000 mg | Freq: Once | RECTAL | Status: AC
Start: 2022-01-16 — End: 2022-01-16
  Administered 2022-01-16: 300 mg via RECTAL
  Filled 2022-01-16: qty 1

## 2022-01-16 MED ORDER — INSULIN ASPART 100 UNIT/ML IJ SOLN
2.0000 [IU] | INTRAMUSCULAR | Status: DC
  Administered 2022-01-16: 2 [IU] via SUBCUTANEOUS
  Administered 2022-01-16: 4 [IU] via SUBCUTANEOUS
  Administered 2022-01-17: 6 [IU] via SUBCUTANEOUS
  Administered 2022-01-17: 2 [IU] via SUBCUTANEOUS
  Administered 2022-01-17: 6 [IU] via SUBCUTANEOUS
  Administered 2022-01-17: 4 [IU] via SUBCUTANEOUS
  Administered 2022-01-17: 6 [IU] via SUBCUTANEOUS
  Administered 2022-01-17 – 2022-01-18 (×2): 2 [IU] via SUBCUTANEOUS
  Administered 2022-01-18: 4 [IU] via SUBCUTANEOUS
  Administered 2022-01-19 (×3): 2 [IU] via SUBCUTANEOUS
  Administered 2022-01-19 (×2): 4 [IU] via SUBCUTANEOUS
  Administered 2022-01-20: 2 [IU] via SUBCUTANEOUS

## 2022-01-16 MED ORDER — HEPARIN BOLUS VIA INFUSION
4000.0000 [IU] | Freq: Once | INTRAVENOUS | Status: AC
  Administered 2022-01-16: 4000 [IU] via INTRAVENOUS
  Filled 2022-01-16: qty 4000

## 2022-01-16 MED ORDER — POTASSIUM CHLORIDE 10 MEQ/50ML IV SOLN
10.0000 meq | INTRAVENOUS | Status: DC
  Filled 2022-01-16: qty 50

## 2022-01-16 MED ORDER — INSULIN ASPART 100 UNIT/ML IJ SOLN: 0.0000 [IU] | INTRAMUSCULAR | Status: DC

## 2022-01-16 MED ORDER — OXYCODONE HCL 5 MG PO TABS
5.0000 mg | ORAL_TABLET | Freq: Once | ORAL | Status: AC
  Administered 2022-01-16: 5 mg via ORAL
  Filled 2022-01-16: qty 1

## 2022-01-16 MED ORDER — POTASSIUM CHLORIDE 10 MEQ/50ML IV SOLN
10.0000 meq | INTRAVENOUS | Status: AC
  Administered 2022-01-16 (×5): 10 meq via INTRAVENOUS
  Filled 2022-01-16 (×4): qty 50

## 2022-01-16 MED ORDER — METOPROLOL TARTRATE 5 MG/5ML IV SOLN
5.0000 mg | Freq: Four times a day (QID) | INTRAVENOUS | Status: DC | PRN
Start: 2022-01-16 — End: 2022-01-23

## 2022-01-16 NOTE — Progress Notes (Addendum)
Inpatient Diabetes Program Recommendations  AACE/ADA: New Consensus Statement on Inpatient Glycemic Control (2015)  Target Ranges:  Prepandial:   less than 140 mg/dL      Peak postprandial:   less than 180 mg/dL (1-2 hours)      Critically ill patients:  140 - 180 mg/dL   Lab Results  Component Value Date   GLUCAP 216 (H) 01/16/2022   HGBA1C 12.2 (H) 03/05/2021    Review of Glycemic Control  Latest Reference Range & Units 01/15/22 22:41 01/15/22 23:28 01/16/22 01:28 01/16/22 02:32 01/16/22 03:29  Glucose-Capillary 70 - 99 mg/dL 177 (H) 222 (H) 182 (H) 194 (H) 216 (H)   Diabetes history: DM  Outpatient Diabetes medications: Omnipod Current orders for Inpatient glycemic control:  IV insulin   Inpatient Diabetes Program Recommendations:    Note plans for IV insulin to be continued due to mental status.  Per RN patient is better today but still alert and oriented x3.  I was able to talk to patient at bedside and reminded her that she was here for high blood sugars.  She is currently wearing Freestyle Libre on left arm.  She also thought that she had insulin pump on, however insulin pump was removed at home (per daughter).   Called Dr. Almetta Lovely office to obtain insulin pump settings. Awaiting call back.    Thanks,  Adah Perl, RN, BC-ADM Inpatient Diabetes Coordinator Pager [phone]  (8a-5p)  1325 addendum- Per Lovena Le from Dr. Almetta Lovely office, patients insulin pump settings are:  MN-5A- 1.05 units/hr  5A-9A- 1.15 units/hr  9A-3P- 1.8 units/hr  3P-MN- 1 unit/hr  Total 24 hour basal is 29.65 units/24 hours-  Patient uses Regular insulin in insulin pump and has a SSI that she uses if blood sugars are really high and also has NPH 15 units prn if blood sugar >300 mg/dL.   Once patient is ready for transition, consider Levemir 15 units bid and Novolog 2-4-6 q 4 hours (while NPO).  She likely will need adjustments based on husbands reports that blood sugars range in the 300's?  I  would not recommend patient going home on insulin pump at discharge due to daughters reports of insulin pump "not working" at home.  Needs close f/u with Endocrinology.   Therefore will need basal/bolus regimen at discharge.

## 2022-01-16 NOTE — Progress Notes (Signed)
[place] Progress Note Patient Name: Susan [name] DOB: Oct 16, 1950 MRN: [id number]   Date of Service  01/16/2022  HPI/Events of Note  Hypokalemia - K+ = 3.0 and Creatinine = 4.27. Patient on insulin IV infusion for DKA.  eICU Interventions  Plan: Replace K+. Repeat BMP at 12 noon.      Intervention Category Major Interventions: Electrolyte abnormality - evaluation and management  [name] 01/16/2022, 5:19 AM

## 2022-01-16 NOTE — Progress Notes (Signed)
eLink Physician-Brief Progress Note Patient Name: Susan [name] DOB: [birth year] MRN: [id number]   Date of Service  01/16/2022  HPI/Events of Note  Multiple issues: 1. Patient c/o bilateral arm pain (pin and needles ?) 2. Patient on PO diet. Nursing request to change rectal Tylenol to oral Tylenol. AST and ALT both normal.   eICU Interventions  Plan: D/C Tylenol suppository PR PRN. Tylenol 650 mg PO Q 6 hours PRN mild pain, moderate pain or fever.  Oxycodone IR 5 mg PO now X 1.     Intervention Category Major Interventions: Other:  Lysle Dingwall 01/16/2022, 10:17 PM

## 2022-01-16 NOTE — Progress Notes (Signed)
NAME:  Mikaelyn Song, MRN:  [id number], DOB:  1951-03-30, LOS: 2 ADMISSION DATE:  01/14/2022, CONSULTATION DATE:  8/22 REFERRING MD:  Dr. Wallace Cullens EDP, CHIEF COMPLAINT:  DKA   History of Present Illness:  71 year old female with PMH as below, which is significant for CAD (s/p PCI in 2000 and 2007), HFrEF (LVEF 30-35%), CKD IV with baseline creatinine 3.5, uncontrolled DM 2, HTN, and LLE venous ulcer. She presented to [facility] ED 8/22 with complaints of N/V/D, abdominal pain, and chest pain. CBG in triage noted at > 600. Reported symptoms for the past few weeks. Has not had water at home for the past 3 days. Has been non-compliant with insulin for 3 days PTA as well. She was lethargic, but arousable upon being roomed. Laboratory evaluation is significant for Glucose > 12000, K 6, Creatinine 5.09, BUN 96, venous pH 7.07. The patient was given 500cc bolus, started on an insulin infusion, and PCCM was asked to admit.   Pertinent  Medical History  HFrEF (EF 25-30% 7/23) CAD s/p PCI in 2000 and 2007 CKD stage IV Type 2 DM  Significant Hospital Events: Including procedures, antibiotic start and stop dates in addition to other pertinent events   8/22 Admitted to ICU 8/23 somnolent, CT head negative 8/24 transitioned off Endotool, eeg triphasic consistent with encephalopathy  Interim History / Subjective:  K low at 2.8 overnight, repletion with IV.   Subjective: Opens eyes to name but does not answer questions. Does not localize pain.   11:45- episode of sustained vtach that resolved spontaneously.  Objective   Blood pressure (!) 108/90, pulse 98, temperature 98.4 F (36.9 C), temperature source Oral, resp. rate 17, height 5\' 6"  (1.676 m), weight 111 kg, SpO2 98 %. 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 CT head 8/23 was negative and EEG 8/24 showed triphasic consistent with encephalopathy. BUN is at 92 in setting of AKI on CKD stage IV, this could be contributing to confusion.  Also treating for UTI, which could be contributing to confusion, but low likelihood with urine culture growing multispecies. She is not safe to eat or drink at this point. -consider cortrak placement 8/25 if mentation does not improve  DKA- resolved Hypokalemia Glucose improved overnight on insulin drip. K at 2.8 this AM. Her mentation has improved enough that she can answer some questions but she remains encephalopathic despite correction of glucose. Will transition to Subcutaneous insulin as able and discontinue central line. Peripheral access has been a  problem. Patient appears hypovolemic on physical exam. -consult to IV team -discontinue IV insulin -start levemir 15 units BID -Novolog 2-4-6  SSI q 4 hrs -consider cortrak 8/25 if not improved -BMP qd -D5 in LR 125 mL/hr -LR 125 ml/hr for 10 more hours  ?UTI Previous cultures with klebsiella resistant to ampicillin and enterobacter resistant to cefazolin,  cefepime, pip/tazo, and bactrim. Both sensitive to cipro. UA with moderate Hgb, leukocytes, WBC, and bacteria. Urine culture grew multiple species. -merrem for 3 days, last dose 8/25  Chronic HFrEF - Telemetry monitoring.  -metoprolol 5mg  q 6 hrs PRN for HR >120 - holding home atorvastatin, asas, carvedilol, clopidogrel, isosorbide, metolazone, sitagliptin while NPO, hypotensive.   CAD PCI in 2000 and 2007. Troponin were 50-86 and EKFG showed RBBB that was present on prior.  AKI in CKD stage IV Baseline creatinine 3.5, elevated to 5.2 on admission. Creatinine improved to 4.25 today. Urine output has increased with 1L output yesterday.  -Strict I and Os -foley catheter in place  Nocturnal hypoxia -continue 2L qhs  Best Practice (right click and "Reselect all SmartList Selections" daily)   Diet/type: NPO DVT prophylaxis: prophylactic heparin  GI prophylaxis: N/A Lines: Central line Foley:  Yes, and it is still needed Code Status:  full code Last date of multidisciplinary goals of care discussion Elleanna Arrison updated by phone [phone]]  Memory Dance. Aradhya Shellenbarger, D.O.  Internal Medicine Resident, PGY-2 [name]  6:30 AM, 01/16/2022

## 2022-01-16 NOTE — Procedures (Signed)
Patient Name: Susan [name]  MRN: [id number]  Epilepsy Attending: Lora Havens  Referring Physician/Provider: Christiana Fuchs, DO Date: 01/16/2022 Duration: 25.33 mins  Patient history: 71yo F with ams. EEG to evaluate for seizure  Level of alertness: Awake  AEDs during EEG study: None  Technical aspects: This EEG study was done with scalp electrodes positioned according to the 10-20 International system of electrode placement. Electrical activity was reviewed with band pass filter of 1-'70Hz'$ , sensitivity of 7 uV/mm, display speed of 59m/sec with a '60Hz'$  notched filter applied as appropriate. EEG data were recorded continuously and digitally stored.  Video monitoring was available and reviewed as appropriate.  Description: No posterior dominant rhythm was seen. EEG showed continuous generalized 3 to 5 Hz theta-delta slowing. Generalized periodic discharges with triphasic morphology at 1.5-2 Hz were also noted. Hyperventilation and photic stimulation were not performed.     ABNORMALITY - Periodic discharges with triphasic morphology, generalized ( GPDs) - Continuous slow, generalized  IMPRESSION: This study showed generalized periodic discharges with triphasic morphology which can be on the ictal-interictal continuum.  However the frequency and morphology is more commonly suggestive of toxic-metabolic causes.  Additionally there is moderate to severe diffuse encephalopathy, nonspecific etiology.  If concern for ictal -interictal activity persists, long-term EEG can be considered.    Dr ALynetta Marewas notified.   Susan [name] Susan [name]

## 2022-01-16 NOTE — Progress Notes (Signed)
ANTICOAGULATION CONSULT NOTE - Initial Consult  Pharmacy Consult for heparin infusion Indication: chest pain/ACS  Allergies  Allergen Reactions   Keflex [Cephalexin] Diarrhea   Codeine Nausea And Vomiting and Other (See Comments)    Patient Measurements: Height: '5\' 6"'$  (167.6 cm) Weight: 111 kg (244 lb 11.4 oz) IBW/kg (Calculated) : 59.3 Heparin Dosing Weight: 84.3  Vital Signs: Temp: 98.6 F (37 C) (08/24 1117) Temp Source: Oral (08/24 1117) BP: 110/49 (08/24 1500) Pulse Rate: 78 (08/24 1500)  Labs: Recent Labs    01/14/22 1650 01/14/22 1654 01/14/22 2245 01/15/22 0135 01/15/22 0535 01/15/22 0920 01/15/22 1633 01/15/22 2114 01/16/22 0152 01/16/22 0552 01/16/22 1345  HGB 11.6*   < >  --   --  13.3  --  12.6  --  11.9*  --   --   HCT 41.4   < >  --   --  39.0  --  37.0  --  31.8*  --   --   PLT 162  --   --   --   --   --   --   --  119*  --   --   APTT  --   --  24  --   --   --   --   --   --   --   --   LABPROT  --   --  16.2*  --   --   --   --   --   --   --   --   INR  --   --  1.3*  --   --   --   --   --   --   --   --   CREATININE 5.09*   < > 5.29*   < >  --    < >  --    < > 4.27* 4.25* 3.66*  TROPONINIHS 55*  --  82*  --   --   --   --   --   --   --  837*   < > = values in this interval not displayed.    Estimated Creatinine Clearance: 17.5 mL/min (A) (by C-G formula based on SCr of 3.66 mg/dL (H)).   Medical History: Past Medical History:  Diagnosis Date   Acute MI ([place]) 1999; 2007   Anemia    hx   Anginal pain ([place])    Anxiety    ARF (acute renal failure) ([place]) 06/2017   [place] [facility] Asso   Arthritis    "generalized" (03/15/2014)   CAD (coronary artery disease)    MI in 2000 - MI  2007 - treated bare metal stent (no nuclear since then as 9/11)   Carotid artery disease (HCC)    CHF (congestive heart failure) (HCC)    Chronic diastolic heart failure ([place])    a) ECHO (08/2013) EF 55-60% and RV function nl b) RHC (08/2013) RA 4, RV 30/5/7,  PA 25/10 (16), PCWP 7, Fick CO/CI 6.3/2.7, PVR 1.5 WU, PA 61 and 66%   Daily headache    "~ every other day; since I fell in June" (03/15/2014)   Depression    Dyslipidemia    Dyspnea    Exertional shortness of breath    HTN (hypertension)    Hypothyroidism    Neuropathy    Obesity    Osteoarthritis    Peripheral neuropathy    PONV (postoperative nausea and vomiting)    RBBB (right  bundle branch block)    Old   Stroke (HCC)    mini strokes   Syncope    likely due to low blood sugar   Tachycardia    Sinus tachycardia   Type II diabetes mellitus (HCC)    Type II   Urinary incontinence    Venous insufficiency     Assessment: Susan [name] is a 71 year old female admitted for DKA. Pharmacy has been consulted for a heparin infusion for chest pain/ACS.   Goal of Therapy:  Heparin level 0.3-0.7 units/ml Monitor platelets by anticoagulation protocol: Yes   Plan:  Give 4000 units bolus x 1 Start heparin infusion at 1000 units/hr Check anti-Xa level in 8 hours and daily while on heparin Continue to monitor H&H and platelets  Vicenta Dunning, PharmD  PGY1 Pharmacy Resident

## 2022-01-16 NOTE — Progress Notes (Signed)
EEG complete - results pending 

## 2022-01-16 NOTE — Progress Notes (Signed)
IV team consult for new PIV. Upon assessment, right upper arm PIV has significant infiltration with significant edema in entire right arm. This arm is not appropriate for PIV at this time. Left arm has a good cephalic vein in upper arm, however no good vein in FA.  Provider stated if we could not have 2 good PIVs, leave CL for now.  There is currently only one good vein for PIV at this time. Recommend leaving central line for at least another day or 2 and evaluate further at that time.

## 2022-01-17 ENCOUNTER — Inpatient Hospital Stay (HOSPITAL_COMMUNITY): Payer: HMO

## 2022-01-17 DIAGNOSIS — I10 Essential (primary) hypertension: Secondary | ICD-10-CM | POA: Diagnosis not present

## 2022-01-17 DIAGNOSIS — I472 Ventricular tachycardia, unspecified: Secondary | ICD-10-CM

## 2022-01-17 DIAGNOSIS — I509 Heart failure, unspecified: Secondary | ICD-10-CM

## 2022-01-17 DIAGNOSIS — I5022 Chronic systolic (congestive) heart failure: Secondary | ICD-10-CM

## 2022-01-17 DIAGNOSIS — R778 Other specified abnormalities of plasma proteins: Secondary | ICD-10-CM

## 2022-01-17 DIAGNOSIS — E860 Dehydration: Secondary | ICD-10-CM

## 2022-01-17 DIAGNOSIS — E1110 Type 2 diabetes mellitus with ketoacidosis without coma: Secondary | ICD-10-CM | POA: Diagnosis not present

## 2022-01-17 LAB — CBC
HCT: 30.5 % — ABNORMAL LOW (ref 36.0–46.0)
Hemoglobin: 10.5 g/dL — ABNORMAL LOW (ref 12.0–15.0)
MCH: 27.1 pg (ref 26.0–34.0)
MCHC: 34.4 g/dL (ref 30.0–36.0)
MCV: 78.6 fL — ABNORMAL LOW (ref 80.0–100.0)
Platelets: 86 10*3/uL — ABNORMAL LOW (ref 150–400)
RBC: 3.88 MIL/uL (ref 3.87–5.11)
RDW: 17.9 % — ABNORMAL HIGH (ref 11.5–15.5)
WBC: 7.8 10*3/uL (ref 4.0–10.5)
nRBC: 0 % (ref 0.0–0.2)

## 2022-01-17 LAB — GLUCOSE, CAPILLARY
Glucose-Capillary: 134 mg/dL — ABNORMAL HIGH (ref 70–99)
Glucose-Capillary: 143 mg/dL — ABNORMAL HIGH (ref 70–99)
Glucose-Capillary: 144 mg/dL — ABNORMAL HIGH (ref 70–99)
Glucose-Capillary: 150 mg/dL — ABNORMAL HIGH (ref 70–99)
Glucose-Capillary: 173 mg/dL — ABNORMAL HIGH (ref 70–99)
Glucose-Capillary: 177 mg/dL — ABNORMAL HIGH (ref 70–99)
Glucose-Capillary: 195 mg/dL — ABNORMAL HIGH (ref 70–99)
Glucose-Capillary: 197 mg/dL — ABNORMAL HIGH (ref 70–99)
Glucose-Capillary: 197 mg/dL — ABNORMAL HIGH (ref 70–99)
Glucose-Capillary: 198 mg/dL — ABNORMAL HIGH (ref 70–99)
Glucose-Capillary: 198 mg/dL — ABNORMAL HIGH (ref 70–99)
Glucose-Capillary: 200 mg/dL — ABNORMAL HIGH (ref 70–99)
Glucose-Capillary: 205 mg/dL — ABNORMAL HIGH (ref 70–99)
Glucose-Capillary: 205 mg/dL — ABNORMAL HIGH (ref 70–99)
Glucose-Capillary: 205 mg/dL — ABNORMAL HIGH (ref 70–99)
Glucose-Capillary: 208 mg/dL — ABNORMAL HIGH (ref 70–99)
Glucose-Capillary: 218 mg/dL — ABNORMAL HIGH (ref 70–99)
Glucose-Capillary: 219 mg/dL — ABNORMAL HIGH (ref 70–99)
Glucose-Capillary: 226 mg/dL — ABNORMAL HIGH (ref 70–99)
Glucose-Capillary: 73 mg/dL (ref 70–99)

## 2022-01-17 LAB — ECHOCARDIOGRAM LIMITED
Height: 66 in
S' Lateral: 4.5 cm
Weight: 3915.37 oz

## 2022-01-17 LAB — MAGNESIUM: Magnesium: 2.4 mg/dL (ref 1.7–2.4)

## 2022-01-17 LAB — BASIC METABOLIC PANEL
Anion gap: 12 (ref 5–15)
BUN: 90 mg/dL — ABNORMAL HIGH (ref 8–23)
CO2: 24 mmol/L (ref 22–32)
Calcium: 8.3 mg/dL — ABNORMAL LOW (ref 8.9–10.3)
Chloride: 94 mmol/L — ABNORMAL LOW (ref 98–111)
Creatinine, Ser: 3.68 mg/dL — ABNORMAL HIGH (ref 0.44–1.00)
GFR, Estimated: 13 mL/min — ABNORMAL LOW (ref >60)
Glucose, Bld: 219 mg/dL — ABNORMAL HIGH (ref 70–99)
Potassium: 3.7 mmol/L (ref 3.5–5.1)
Sodium: 130 mmol/L — ABNORMAL LOW (ref 135–145)

## 2022-01-17 LAB — PHOSPHORUS: Phosphorus: 4 mg/dL (ref 2.5–4.6)

## 2022-01-17 LAB — HEPARIN LEVEL (UNFRACTIONATED)
Heparin Unfractionated: <0.1 IU/mL — ABNORMAL LOW (ref 0.30–0.70)
Heparin Unfractionated: <0.1 IU/mL — ABNORMAL LOW (ref 0.30–0.70)

## 2022-01-17 MED ORDER — TORSEMIDE 20 MG PO TABS
80.0000 mg | ORAL_TABLET | Freq: Two times a day (BID) | ORAL | Status: DC
  Administered 2022-01-17 – 2022-01-23 (×12): 80 mg via ORAL
  Filled 2022-01-17 (×12): qty 4

## 2022-01-17 MED ORDER — CARVEDILOL 6.25 MG PO TABS
6.2500 mg | ORAL_TABLET | Freq: Two times a day (BID) | ORAL | Status: DC
  Administered 2022-01-17 – 2022-01-23 (×12): 6.25 mg via ORAL
  Filled 2022-01-17 (×12): qty 1

## 2022-01-17 MED ORDER — AMIODARONE HCL 200 MG PO TABS
400.0000 mg | ORAL_TABLET | Freq: Two times a day (BID) | ORAL | Status: DC
  Administered 2022-01-17 – 2022-01-21 (×9): 400 mg via ORAL
  Filled 2022-01-17 (×9): qty 2

## 2022-01-17 MED ORDER — ASPIRIN 81 MG PO CHEW
81.0000 mg | CHEWABLE_TABLET | Freq: Every day | ORAL | Status: DC
  Administered 2022-01-18 – 2022-01-23 (×6): 81 mg via NASOGASTRIC
  Filled 2022-01-17 (×6): qty 1

## 2022-01-17 MED ORDER — OXYCODONE HCL 5 MG PO TABS
5.0000 mg | ORAL_TABLET | Freq: Four times a day (QID) | ORAL | Status: DC | PRN
  Administered 2022-01-17 (×2): 5 mg via ORAL
  Filled 2022-01-17 (×3): qty 1

## 2022-01-17 NOTE — Progress Notes (Addendum)
Inpatient Diabetes Program Recommendations  AACE/ADA: New Consensus Statement on Inpatient Glycemic Control (2015)  Target Ranges:  Prepandial:   less than 140 mg/dL      Peak postprandial:   less than 180 mg/dL (1-2 hours)      Critically ill patients:  140 - 180 mg/dL   Lab Results  Component Value Date   GLUCAP 205 (H) 01/17/2022   HGBA1C 12.2 (H) 03/05/2021    Review of Glycemic Control  Latest Reference Range & Units 01/16/22 19:51 01/17/22 00:22 01/17/22 01:18 01/17/22 04:07 01/17/22 08:18  Glucose-Capillary 70 - 99 mg/dL 146 (H) 219 (H) 226 (H) 218 (H) 205 (H)  Diabetes history: DM  Outpatient Diabetes medications: Omnipod Current orders for Inpatient glycemic control: Per Lovena Le from Dr. Almetta Lovely office, patients insulin pump settings are:  MN-5A- 1.05 units/hr  5A-9A- 1.15 units/hr  9A-3P- 1.8 units/hr  3P-MN- 1 unit/hr  Total 24 hour basal is 29.65 units/24 hours-  Patient uses Regular insulin in insulin pump and has a SSI that she uses if blood sugars are really high and also has NPH 15 units prn if blood sugar >300 mg/dL.    Current orders for Inpatient glycemic control:  Levemir 15 units bid Novolog 2-4-6 q 4 hours Inpatient Diabetes Program Recommendations:   Consider increasing Levemir to 20 units bid.  Note Dextrose stopped overnight due to elevated blood sugars.   Thanks,  Adah Perl, RN, BC-ADM Inpatient Diabetes Coordinator Pager [phone]  (8a-5p)

## 2022-01-17 NOTE — Consult Note (Addendum)
Cardiology Consultation:   Patient ID: Susan [name] MRN: [id number]; DOB: 07-12-1950  Admit date: 01/14/2022 Date of Consult: 01/17/2022  PCP:  [name], MD   [facility] Providers Cardiologist:  Glori Bickers, MD   IV ordered   Patient Profile:   Susan [name] is a 71 y.o. female with a hx of morbid obesity, CAD with PCI to RCA, HFrEF, CKD stage IV, hypertension, hyperlipidemia, DM2, RBBB, TIA and PAD s/p transmetatarsal amputation who is being seen 01/17/2022 for the evaluation of elevated troponin and VT at the request of Dr. Tacy Learn.  History of Present Illness:   [name] is a 71 year old female with past medical history of morbid obesity, CAD with PCI to RCA, HFrEF, CKD stage IV, hypertension, hyperlipidemia, DM2, RBBB, TIA and PAD s/p transmetatarsal amputation.  Patient underwent BMS to RCA in 2000 and repeat BMS to distal RCA in 2007.  Myoview in November 2003 was negative for ischemia.  Right heart cath performed in March 2019 showed mild PAH with evidence of RV strain likely due to obesity hypoventilation syndrome or obstructive sleep apnea.  Echocardiogram in September 2021 showed EF 55 to 65%.  Patient was admitted in October 2022 with NSTEMI.  High-sensitivity troponin went up to 247.  Echocardiogram showed a drop in the ejection fraction to 30-35% with new inferior wall motion abnormality.  It was suspected she had progression of RCA disease.  Medical therapy was recommended due to underlying CKD stage IV with baseline creatinine of 3.5.  Hospital course was complicated by AKI and a urinary tract infection.  Patient was most recently seen by heart failure service on 12/10/2021 at which time it was mentioned that the patient has not taken metolazone for a while.  She was restarted metolazone 2.5 mg along with potassium supplement every other Wednesday.  She is also on torsemide 80 mg twice a day, carvedilol 12.5 mg twice a day.  She was started on Imdur 15 mg daily.  She  was not on hydralazine due to previously low blood pressure.  She was admitted to the hospital again on 01/14/2022 after coming in with nausea, vomiting, diarrhea, abdominal pain and chest pain.  Initial CBG in the triage was noted to be greater than 600.  ED note also suggested patient has not had any water at home for the past 3 days, however patient says she has bottled water even though her well is broken.  She also has not taken her medication including insulin for 3 days as well.  Initial laboratory finding was significant for glucose greater than 1200, sodium 112, potassium 6, creatinine 5.09, lactic acid 3.2, white blood cell count 14.6, BNP 1929, procalcitonin 13.9.  Urinalysis showed moderate leukocyte, moderate hemoglobin, greater than 500 glucose and a few bacteria.  She was hypotensive with blood pressure of 82/36.  Patient was treated for DKA and dehydration.  Home Lipitor, aspirin, carvedilol, Plavix, Imdur, metolazone and Sitagliptin were all held.  With hydration, her renal function gradually improved.  CT of the head was negative for intracranial etiology.  According to patient, when she first came to the hospital, per chest pain was very brief lasting only last seconds each time.  She denies any chest pain prior to coming to the hospital. In the past 2 days, she has been having more prolonged chest pain lasting up to a minute.   Yesterday morning, around 11:41 AM, she went into atrial fibrillation with RVR, however patient subsequently had a prolonged episode of monomorphic VT.  She was in VT for roughly 4-minute.  Surprisingly, patient denied any significant discomfort.  Although her potassium in the morning was 2.8, repeat potassium after the event was greater than 7.5.  Repeat blood work hour later showed potassium was actually 3.9.  Serial troponin was 837-->898.  EEG suggested toxic metabolic causes.  Echocardiogram obtained on 01/17/2022 showed EF 30 to 35%, m mild to moderate TR,  moderately reduced RVEF.  There was regional wall motion abnormality include akinesis of the entire inferior wall and apex, hypokinesis of the anterior anterior wall, lateral wall and entire septum.  Cardiology service was consulted for elevated troponin and VT.  Of note, since 5 AM this morning, patient has went into either 2 to atrial flutter versus SVT with baseline heart rate of 120 bpm.  Past Medical History:  Diagnosis Date   Acute MI ([place]) 1999; 2007   Anemia    hx   Anginal pain ([place])    Anxiety    ARF (acute renal failure) ([place]) 06/2017   [facility]   Arthritis    "generalized" (03/15/2014)   CAD (coronary artery disease)    MI in 2000 - MI  2007 - treated bare metal stent (no nuclear since then as 9/11)   Carotid artery disease (H[facility])    CHF (congestive heart failure) ([place])    Chronic diastolic heart failure ([place])    a) ECHO (08/2013) EF 55-60% and RV function nl b) RHC (08/2013) RA 4, RV 30/5/7, PA 25/10 (16), PCWP 7, Fick CO/CI 6.3/2.7, PVR 1.5 WU, PA 61 and 66%   Daily headache    "~ every other day; since I fell in June" (03/15/2014)   Depression    Dyslipidemia    Dyspnea    Exertional shortness of breath    HTN (hypertension)    Hypothyroidism    Neuropathy    Obesity    Osteoarthritis    Peripheral neuropathy    PONV (postoperative nausea and vomiting)    RBBB (right bundle branch block)    Old   Stroke ([place])    mini strokes   Syncope    likely due to low blood sugar   Tachycardia    Sinus tachycardia   Type II diabetes mellitus ([place])    Type II   Urinary incontinence    Venous insufficiency     Past Surgical History:  Procedure Laterality Date   ABDOMINAL HYSTERECTOMY  1980's   AMPUTATION Right 02/24/2018   Procedure: RIGHT FOOT GREAT TOE AND 2ND TOE AMPUTATION;  Surgeon: Newt Minion, MD;  Location: [place];  Service: Orthopedics;  Laterality: Right;   AMPUTATION Right 04/30/2018   Procedure: RIGHT TRANSMETATARSAL AMPUTATION;  Surgeon: Newt Minion, MD;  Location: [place];  Service: Orthopedics;  Laterality: Right;   BIOPSY  05/27/2020   Procedure: BIOPSY;  Surgeon: [name], DO;  [facility];  Service: Endoscopy;;   CATARACT EXTRACTION, BILATERAL Bilateral ?2013   COLONOSCOPY W/ POLYPECTOMY     COLONOSCOPY WITH PROPOFOL N/A 03/13/2019   Procedure: COLONOSCOPY WITH PROPOFOL;  Surgeon: Jerene Bears, MD;  Location: [place];  Service: Gastroenterology;  Laterality: N/A;   [place]; 2007   "1 + 1"   ESOPHAGOGASTRODUODENOSCOPY (EGD) WITH PROPOFOL N/A 03/13/2019   Procedure: ESOPHAGOGASTRODUODENOSCOPY (EGD) WITH PROPOFOL;  Surgeon: Jerene Bears, MD;  Location: [place];  Service: Gastroenterology;  Laterality: N/A;   ESOPHAGOGASTRODUODENOSCOPY (EGD) WITH PROPOFOL N/A 05/27/2020  Procedure: ESOPHAGOGASTRODUODENOSCOPY (EGD) WITH PROPOFOL;  Surgeon: [name], DO;  [facility];  Service: Endoscopy;  Laterality: N/A;   EYE SURGERY Bilateral    lazer   HEMOSTASIS CLIP PLACEMENT  03/13/2019   Procedure: HEMOSTASIS CLIP PLACEMENT;  Surgeon: Jerene Bears, MD;  [facility] ENDOSCOPY;  Service: Gastroenterology;;   KNEE ARTHROSCOPY Left 10/25/2006   POLYPECTOMY  03/13/2019   Procedure: POLYPECTOMY;  Surgeon: Jerene Bears, MD;  Location: [place];  Service: Gastroenterology;;   RIGHT HEART CATH N/A 07/24/2017   Procedure: RIGHT HEART CATH;  Surgeon: Jolaine Artist, MD;  Location: [place] [facility];  Service: Cardiovascular;  Laterality: N/A;   RIGHT HEART CATHETERIZATION N/A 09/22/2013   Procedure: RIGHT HEART CATH;  Surgeon: Jolaine Artist, MD;  Location: [facility] C[facility] LAB;  Service: Cardiovascular;  Laterality: N/A;   SHOULDER ARTHROSCOPY WITH OPEN ROTATOR CUFF REPAIR Right 03/14/2014   Procedure: RIGHT SHOULDER ARTHROSCOPY WITH BICEPS RELEASE, OPEN SUBSCAPULA REPAIR, OPEN SUPRASPINATUS REPAIR.;  Surgeon: Meredith Pel, MD;  Location: [place];   Service: Orthopedics;  Laterality: Right;   TOE AMPUTATION Right 02/24/2018   GREAT TOE AND 2ND TOE AMPUTATION   TUBAL LIGATION  1970's     Home Medications:  Prior to Admission medications   Medication Sig Start Date End Date Taking? Authorizing Provider  albuterol (VENTOLIN HFA) 108 (90 Base) MCG/ACT inhaler Inhale 1-2 puffs into the lungs every 6 (six) hours as needed for wheezing or shortness of breath.   Yes [provider]  atorvastatin (LIPITOR) 80 MG tablet TAKE 1 TABLET BY MOUTH EVERY DAY Patient taking differently: Take 80 mg by mouth daily. 09/20/21  Yes Bensimhon, Shaune Pascal, MD  carvedilol (COREG) 12.5 MG tablet TAKE 1 TABLET (12.'5MG'$  TOTAL) BY MOUTH TWICE A DAY WITH A MEAL Patient taking differently: Take 12.5 mg by mouth 2 (two) times daily with a meal. 09/20/21  Yes [name], [name], MD  clopidogrel (PLAVIX) 75 MG tablet TAKE 1 TABLET BY MOUTH EVERY DAY WITH BREAKFAST Patient taking differently: Take 75 mg by mouth daily. 07/22/21  Yes Bensimhon, Shaune Pascal, MD  colchicine 0.6 MG tablet Take 0.6 mg by mouth daily as needed (gout).   Yes [provider]  ferrous sulfate 325 (65 FE) MG tablet Take 325 mg by mouth 2 (two) times daily.   Yes [provider]  isosorbide mononitrate (IMDUR) 30 MG 24 hr tablet Take 0.5 tablets (15 mg total) by mouth daily. 12/10/21  Yes Milford, Maricela Bo, FNP  levothyroxine (SYNTHROID, LEVOTHROID) 50 MCG tablet Take 1 tablet (50 mcg total) by mouth daily before breakfast. 11/07/16  Yes Dena Billet B, PA-C  magnesium oxide (MAG-OX) 400 (240 Mg) MG tablet Take 1 tablet (400 mg total) by mouth daily. 07/18/21  Yes [name], MD  metolazone (ZAROXOLYN) 2.5 MG tablet Take 1 tablet (2.5 mg total) by mouth as directed. Every other week 12/10/21 03/10/22 Yes Milford, Maricela Bo, FNP  Nutritional Supplements (FRUIT & VEGETABLE DAILY PO) Take 1 tablet by mouth daily.   Yes [provider]  nystatin (MYCOSTATIN/NYSTOP) powder  Apply 1 Application topically 2 (two) times daily as needed for irritation. 10/07/21  Yes [provider]  nystatin cream (MYCOSTATIN) Apply 1 Application topically 2 (two) times daily. 12/04/21  Yes [provider]  OXYGEN Inhale 2 L/min into the lungs at bedtime.   Yes [provider]  polyethylene glycol (MIRALAX / GLYCOLAX) 17 g packet Take 17 g by mouth  as needed for mild constipation.   Yes [provider]  pregabalin (LYRICA) 150 MG capsule Take 150 mg by mouth 2 (two) times daily. 03/20/21  Yes [provider]  sitaGLIPtin (JANUVIA) 25 MG tablet Take 1 tablet (25 mg total) by mouth daily. 03/12/21  Yes [name], MD  torsemide (DEMADEX) 20 MG tablet Take 4 tablets (80 mg total) by mouth 2 (two) times daily. 12/10/21  Yes Milford, [name] M, FNP  TRINTELLIX 10 MG TABS tablet TAKE 1 TABLET BY MOUTH EVERY DAY Patient taking differently: Take 10 mg by mouth daily. 10/22/21  Yes [name], MD  ursodiol (ACTIGALL) 500 MG tablet Take 500 mg by mouth 3 (three) times daily. 12/11/20  Yes [provider]  aspirin EC 81 MG tablet Take 1 tablet (81 mg total) by mouth daily with breakfast. Patient not taking: Reported on 01/16/2022 05/28/20   Roxan Hockey, MD  diphenoxylate-atropine (LOMOTIL) 2.5-0.025 MG tablet Take 1 tablet by mouth 4 (four) times daily as needed for diarrhea or loose stools. 08/19/21   [name], MD  insulin NPH Human (HUMULIN N,NOVOLIN N) 100 UNIT/ML injection Inject 15 Units into the skin See admin instructions. Inject 15 units subcutaneously in the morning if CBG >300;    [provider]  insulin regular (NOVOLIN R) 100 units/mL injection Inject into the skin See admin instructions. Manually add bolus to continuous dose via OmniPod 3 times daily per sliding scale (CBG 80-150 7 units, 151-200 9 units, 201-250 12 units, 251-300 14 units, 301- 400 17 units)    [provider]  nitroGLYCERIN  (NITROSTAT) 0.4 MG SL tablet PLACE 1 TABLET (0.4 MG TOTAL) UNDER THE TONGUE EVERY 5 (FIVE) MINUTES AS NEEDED FOR CHEST PAIN. 02/06/21   Larey Dresser, MD    Inpatient Medications: Scheduled Meds:  aspirin  81 mg Per NG tube Daily   Chlorhexidine Gluconate Cloth  6 each Topical Daily   insulin aspart  2-6 Units Subcutaneous Q4H   insulin detemir  15 Units Subcutaneous BID   mouth rinse  15 mL Mouth Rinse 4 times per day   sodium chloride flush  10-40 mL Intracatheter Q12H   Continuous Infusions:  sodium chloride 10 mL/hr at 01/17/22 1600   heparin 1,600 Units/hr (01/17/22 1615)   meropenem (MERREM) IV Stopped (01/17/22 1151)   PRN Meds: sodium chloride, acetaminophen, dextrose, metoprolol tartrate, mouth rinse, oxyCODONE, sodium chloride flush  Allergies:    Allergies  Allergen Reactions   Keflex [Cephalexin] Diarrhea   Codeine Nausea And Vomiting and Other (See Comments)    Social History:   Social History   Socioeconomic History   Marital status: Married    Spouse name: Percell Miller   Number of children: 3   Years of education: 12th   Highest education level: Not on file  Occupational History    Employer: UNEMPLOYED  Tobacco Use   Smoking status: Former    Packs/day: 3.00    Years: 32.00    Total pack years: 96.00    Types: Cigarettes    Quit date: 10/24/1997    Years since quitting: 24.2   Smokeless tobacco: Never  Vaping Use   Vaping Use: Never used  Substance and Sexual Activity   Alcohol use: Not Currently    Comment: "might have 2-3 daiquiris in the summer"   Drug use: No   Sexual activity: Not Currently  Other Topics Concern   Not on file  Social History Narrative   Pt lives  at home with her spouse.Caffeine Use- 3 sodas daily.   Social Determinants of Health   Financial Resource Strain: Low Risk  (08/16/2021)   Overall Financial Resource Strain (CARDIA)    Difficulty of Paying Living Expenses: Not hard at all  Food Insecurity: No Food Insecurity  (08/16/2021)   Hunger Vital Sign    Worried About Running Out of Food in the Last Year: Never true    Ran Out of Food in the Last Year: Never true  Transportation Needs: No Transportation Needs (08/16/2021)   PRAPARE - Hydrologist (Medical): No    Lack of Transportation (Non-Medical): No  Physical Activity: Inactive (08/16/2021)   Exercise Vital Sign    Days of Exercise per Week: 0 days    Minutes of Exercise per Session: 0 min  Stress: No Stress Concern Present (08/16/2021)   [place]    Feeling of Stress : Not at all  Social Connections: Moderately Isolated (08/16/2021)   Social Connection and Isolation Panel [NHANES]    Frequency of Communication with Friends and Family: More than three times a week    Frequency of Social Gatherings with Friends and Family: More than three times a week    Attends Religious Services: Never    Marine scientist or Organizations: No    Attends Archivist Meetings: Never    Marital Status: Married  Human resources officer Violence: Not At Risk (08/16/2021)   Humiliation, Afraid, Rape, and Kick questionnaire    Fear of Current or Ex-Partner: No    Emotionally Abused: No    Physically Abused: No    Sexually Abused: No    Family History:    Family History  Problem Relation Age of Onset   Heart attack Mother 57     ROS:  Please see the history of present illness.   All other ROS reviewed and negative.     Physical Exam/Data:   Vitals:   01/17/22 1400 01/17/22 1500 01/17/22 1513 01/17/22 1600  BP: (!) 112/90 (!) 147/72  (!) 145/68  Pulse: (!) 117 (!) 122  (!) 122  Resp: '13 16  16  '$ Temp:   98.4 F (36.9 C)   TempSrc:   Oral   SpO2: 100% 97%  94%  Weight:      Height:        Intake/Output Summary (Last 24 hours) at 01/17/2022 1707 Last data filed at 01/17/2022 1600 Gross per 24 hour  Intake 2336.99 ml  Output 1585 ml  Net 751.99 ml       01/16/2022    4:00 AM 01/15/2022   12:47 AM 12/20/2021    3:12 PM  Last 3 Weights  Weight (lbs) 244 lb 11.4 oz 238 lb 8.6 oz 239 lb  Weight (kg) 111 kg 108.2 kg 108.41 kg     Body mass index is 39.5 kg/m.  General:  Well nourished, well developed, in no acute distress HEENT: normal Neck: no JVD Vascular: No carotid bruits; Distal pulses 2+ bilaterally Cardiac:  normal S1, S2; RRR; no murmur  Lungs:  clear to auscultation bilaterally, no wheezing, rhonchi or rales  Abd: soft, nontender, no hepatomegaly  Ext: no edema Musculoskeletal:  No deformities, BUE and BLE strength normal and equal Skin: warm and dry  Neuro:  CNs 2-12 intact, no focal abnormalities noted Psych:  Normal affect   EKG:  The EKG was personally reviewed and demonstrates: Normal sinus rhythm,  Q waves in the inferior leads. Telemetry:  Telemetry was personally reviewed and demonstrates:  Yesterday around 11:16AM, patient went into afib with RVR, around 11:41AM patient went into monomorphic VT converted back to sinus rhythm around 11:45AM. Total duration of VT was 4 min. Sinus rhythm overnight. Atrial flutter this morning started around 5AM, still in atrial flutter with HR 120s now.   Relevant CV Studies:  Limited echo 01/17/2022 1. Left ventricular ejection fraction, by estimation, is 30 to 35%. The  left ventricle has moderately decreased function. The left ventricle  demonstrates regional wall motion abnormalities (see scoring  diagram/findings for description). Left ventricular   diastolic function could not be evaluated.   2. Right ventricular systolic function is moderately reduced. The right  ventricular size is moderately enlarged. There is mildly elevated  pulmonary artery systolic pressure.   3. The mitral valve is abnormal. Trivial mitral valve regurgitation. No  evidence of mitral stenosis.   4. Tricuspid valve regurgitation is mild to moderate.   5. The aortic valve is normal in structure. Aortic  valve regurgitation is  not visualized. No aortic stenosis is present.   6. The inferior vena cava is normal in size with greater than 50%  respiratory variability, suggesting right atrial pressure of 3 mmHg.   Laboratory Data:  High Sensitivity Troponin:   Recent Labs  Lab 01/14/22 1650 01/14/22 2245 01/16/22 1345 01/16/22 1559  TROPONINIHS 55* 82* 837* 898*     Chemistry Recent Labs  Lab 01/14/22 2245 01/15/22 0135 01/16/22 1345 01/16/22 1559 01/17/22 0108  NA 114*   < > 122* 129* 130*  K 4.0   < > >7.5* 3.9 3.7  CL 73*   < > 99 94* 94*  CO2 14*   < > 21* 22 24  GLUCOSE >1,200*   < > 205* 193* 219*  BUN 96*   < > 87* 93* 90*  CREATININE 5.29*   < > 3.66* 3.92* 3.68*  CALCIUM 7.8*   < > 7.6* 8.3* 8.3*  MG 2.0  --  1.4*  --  2.4  GFRNONAA 8*   < > 13* 12* 13*  ANIONGAP 27*   < > 2* 13 12   < > = values in this interval not displayed.    Recent Labs  Lab 01/14/22 1650  PROT 6.3*  ALBUMIN 3.4*  AST 28  ALT 24  ALKPHOS 103  BILITOT 1.9*   Lipids No results for input(s): "CHOL", "TRIG", "HDL", "LABVLDL", "LDLCALC", "CHOLHDL" in the last 168 hours.  Hematology Recent Labs  Lab 01/14/22 1650 01/14/22 1654 01/15/22 1633 01/16/22 0152 01/17/22 0108  WBC 14.6*  --   --  9.5 7.8  RBC 4.25  --   --  4.29 3.88  HGB 11.6*   < > 12.6 11.9* 10.5*  HCT 41.4   < > 37.0 31.8* 30.5*  MCV 97.4  --   --  74.1* 78.6*  MCH 27.3  --   --  27.7 27.1  MCHC 28.0*  --   --  37.4* 34.4  RDW 16.9*  --   --  17.0* 17.9*  PLT 162  --   --  119* 86*   < > = values in this interval not displayed.   Thyroid No results for input(s): "TSH", "FREET4" in the last 168 hours.  BNP Recent Labs  Lab 01/15/22 0132  BNP 1,929.8*    DDimer No results for input(s): "DDIMER" in the last 168 hours.   Radiology/Studies:  ECHOCARDIOGRAM LIMITED  Result Date: 01/17/2022    ECHOCARDIOGRAM LIMITED REPORT   Patient Name:   ELAINA CARA Date of Exam: 01/17/2022 Medical Rec #:  [id number]     Height:       66.0 in Accession #:    [id number]   Weight:       244.7 lb Date of Birth:  Mar 01, 1951    BSA:          2.179 m Patient Age:    22 years     BP:           121/77 mmHg Patient Gender: F            HR:           114 bpm. Exam Location:  Inpatient Procedure: Limited Echo Indications:    congestive heart failure  History:        Patient has prior history of Echocardiogram examinations, most                 recent 12/10/2021. Arrythmias:RBBB; Risk Factors:Diabetes,                 Hypertension and Dyslipidemia.  Sonographer:    Johny Chess RDCS Referring Phys: [id number] Kipp Brood  Sonographer Comments: Patient is obese. IMPRESSIONS  1. Left ventricular ejection fraction, by estimation, is 30 to 35%. The left ventricle has moderately decreased function. The left ventricle demonstrates regional wall motion abnormalities (see scoring diagram/findings for description). Left ventricular  diastolic function could not be evaluated.  2. Right ventricular systolic function is moderately reduced. The right ventricular size is moderately enlarged. There is mildly elevated pulmonary artery systolic pressure.  3. The mitral valve is abnormal. Trivial mitral valve regurgitation. No evidence of mitral stenosis.  4. Tricuspid valve regurgitation is mild to moderate.  5. The aortic valve is normal in structure. Aortic valve regurgitation is not visualized. No aortic stenosis is present.  6. The inferior vena cava is normal in size with greater than 50% respiratory variability, suggesting right atrial pressure of 3 mmHg. FINDINGS  Left Ventricle: Left ventricular ejection fraction, by estimation, is 30 to 35%. The left ventricle has moderately decreased function. The left ventricle demonstrates regional wall motion abnormalities. The left ventricular internal cavity size was normal in size. There is no left ventricular hypertrophy. Left ventricular diastolic function could not be evaluated. Left ventricular diastolic  function could not be evaluated due to nondiagnostic images.  LV Wall Scoring: The entire inferior wall and apex are akinetic. The entire anterior wall, entire lateral wall, and entire septum are hypokinetic. Right Ventricle: The right ventricular size is moderately enlarged. No increase in right ventricular wall thickness. Right ventricular systolic function is moderately reduced. There is mildly elevated pulmonary artery systolic pressure. The tricuspid regurgitant velocity is 3.16 m/s, and with an assumed right atrial pressure of 3 mmHg, the estimated right ventricular systolic pressure is 84.1 mmHg. Left Atrium: Left atrial size was normal in size. Right Atrium: Right atrial size was normal in size. Pericardium: There is no evidence of pericardial effusion. Mitral Valve: The mitral valve is abnormal. Trivial mitral valve regurgitation. No evidence of mitral valve stenosis. Tricuspid Valve: The tricuspid valve is normal in structure. Tricuspid valve regurgitation is mild to moderate. No evidence of tricuspid stenosis. Aortic Valve: The aortic valve is normal in structure. Aortic valve regurgitation is not visualized. No aortic stenosis is present. Pulmonic Valve: The pulmonic valve was normal in structure. Pulmonic valve regurgitation is not visualized.  No evidence of pulmonic stenosis. Aorta: The aortic root is normal in size and structure. Venous: The inferior vena cava is normal in size with greater than 50% respiratory variability, suggesting right atrial pressure of 3 mmHg. IAS/Shunts: No atrial level shunt detected by color flow Doppler. LEFT VENTRICLE PLAX 2D LVIDd:         5.40 cm LVIDs:         4.50 cm LV PW:         1.00 cm LV IVS:        0.80 cm  IVC IVC diam: 1.70 cm LEFT ATRIUM         Index LA diam:    4.40 cm 2.02 cm/m   AORTA Ao Asc diam: 2.80 cm TRICUSPID VALVE TR Peak grad:   39.9 mmHg TR Vmax:        316.00 cm/s Skeet Latch MD Electronically signed by Skeet Latch MD Signature  Date/Time: 01/17/2022/12:38:00 PM    Final    EEG adult  Result Date: 01/16/2022 Lora Havens, MD     01/16/2022 10:41 AM Patient Name: Germaine Ripp MRN: [id number] Epilepsy Attending: Lora Havens Referring Physician/Provider: Christiana Fuchs, DO Date: 01/16/2022 Duration: 25.33 mins Patient history: 71yo F with ams. EEG to evaluate for seizure Level of alertness: Awake AEDs during EEG study: None Technical aspects: This EEG study was done with scalp electrodes positioned according to the 10-20 International system of electrode placement. Electrical activity was reviewed with band pass filter of 1-'70Hz'$ , sensitivity of 7 uV/mm, display speed of 36m/sec with a '60Hz'$  notched filter applied as appropriate. EEG data were recorded continuously and digitally stored.  Video monitoring was available and reviewed as appropriate. Description: No posterior dominant rhythm was seen. EEG showed continuous generalized 3 to 5 Hz theta-delta slowing. Generalized periodic discharges with triphasic morphology at 1.5-2 Hz were also noted. Hyperventilation and photic stimulation were not performed.   ABNORMALITY - Periodic discharges with triphasic morphology, generalized ( GPDs) - Continuous slow, generalized IMPRESSION: This study showed generalized periodic discharges with triphasic morphology which can be on the ictal-interictal continuum.  However the frequency and morphology is more commonly suggestive of toxic-metabolic causes.  Additionally there is moderate to severe diffuse encephalopathy, nonspecific etiology.  If concern for ictal -interictal activity persists, long-term EEG can be considered.  Dr ALynetta Marewas notified. Priyanka OBarbra Sarks  CT HEAD WO CONTRAST (5MM)  Result Date: 01/15/2022 CLINICAL DATA:  Altered mental status, nontraumatic (Ped 0-17y) EXAM: CT HEAD WITHOUT CONTRAST TECHNIQUE: Contiguous axial images were obtained from the base of the skull through the vertex without intravenous contrast.  RADIATION DOSE REDUCTION: This exam was performed according to the departmental dose-optimization program which includes automated exposure control, adjustment of the mA and/or kV according to patient size and/or use of iterative reconstruction technique. COMPARISON:  None Available. FINDINGS: Brain: No evidence of acute infarction, hemorrhage, hydrocephalus, extra-axial collection or mass lesion/mass effect. Chronic microvascular ischemic disease and cerebral atrophy. Vascular: No hyperdense vessel identified. Skull: No acute fracture. Sinuses/Orbits: Right maxillary sinus air-fluid level. Otherwise, clear sinuses. No acute orbital findings per Other: No mastoid effusions. IMPRESSION: No evidence of acute intracranial abnormality. Electronically Signed   By: FMargaretha SheffieldM.D.   On: 01/15/2022 10:07   DG Chest Port 1 View  Result Date: 01/15/2022 CLINICAL DATA:  71year old female with respiratory failure. EXAM: PORTABLE CHEST 1 VIEW COMPARISON:  Portable chest 01/14/2022 and earlier. FINDINGS: Portable AP semi upright view at 0537 hours. Stable somewhat  low lung volumes. Stable mediastinal contours, borderline to mild cardiomegaly. Visualized tracheal air column is within normal limits. Allowing for portable technique the lungs are clear. No pneumothorax or pleural effusion. Paucity of bowel gas in the visible upper abdomen. No acute osseous abnormality identified. IMPRESSION: No acute cardiopulmonary abnormality. Electronically Signed   By: Genevie Ann M.D.   On: 01/15/2022 06:02   US Abdomen Complete  Result Date: 01/14/2022 CLINICAL DATA:  Abdomen pain EXAM: ABDOMEN ULTRASOUND COMPLETE COMPARISON:  CT 08/22/2020, ultrasound 08/14/2020 FINDINGS: Gallbladder: Distended with sludge and small stones. Increased wall thickness at 4.4 mm. Negative sonographic Murphy. Common bile duct: Diameter: 3.9 mm Liver: No focal lesion identified. Within normal limits in parenchymal echogenicity. Portal vein is patent on  color Doppler imaging with normal direction of blood flow towards the liver. IVC: No abnormality visualized. Pancreas: Visualized portion unremarkable. Spleen: Upper normal in size Right Kidney: Length: 12.7 cm. Echogenicity normal. No hydronephrosis. Cyst at the lower pole measuring 2.3 cm, no follow-up imaging is recommended. Left Kidney: Length: 11.8 cm. Cortex appears slightly echogenic. No mass or hydronephrosis. Abdominal aorta: No aneurysm visualized. Other findings: None. IMPRESSION: 1. Slightly distended gallbladder with sludge and stones. Gallbladder wall thickening is present but sonographic Percell Miller is negative; if clinical symptoms are suggestive of acute gallbladder disease, correlation with nuclear medicine hepatobiliary imaging could be obtained. 2. Left renal cortex appears slightly echogenic suggesting medical renal disease. No hydronephrosis. Electronically Signed   By: Donavan Foil M.D.   On: 01/14/2022 22:21   DG Chest Port 1 View  Result Date: 01/14/2022 CLINICAL DATA:  Chest pain EXAM: PORTABLE CHEST 1 VIEW COMPARISON:  Chest x-ray dated March 05, 2021 FINDINGS: Unchanged mild cardiomegaly. Right costophrenic angle is excluded from the field of view. Mild interstitial opacities. No focal consolidation. No large pleural effusion or evidence of pneumothorax. IMPRESSION: Similar mild interstitial opacities, likely due to pulmonary edema. Electronically Signed   By: Yetta Glassman M.D.   On: 01/14/2022 18:49     Assessment and Plan:   Tachycardia: It appears patient had atrial fibrillation yesterday morning followed by a prolonged episode of sustained VT lasted roughly 4 minutes before self terminating.  This morning around 5 AM, patient went into either 2-1 atrial flutter versus SVT, since then, patient did convert back to sinus rhythm briefly around 10:24 AM, however has since went back into fast regular rhythm.  Her current heart rate is 120 bpm.  Will discuss with MD, will need to  slowly restart carvedilol and consider amiodarone therapy.  I am not sure if she can tolerate the previous dose of 12.5 mg twice a day carvedilol, potentially restart at a lower dose of 6.25 mg twice a day for now.  Continue IV heparin.  Sustained VT: Lasted a total of 4 minutes between 11:41 AM and 11:45 AM on 01/16/2022.  Episode was preceded by what appears to be atrial fibrillation with RVR with heart rate in the 30s, however during the VT, heart rate went up to the 180s.  After the event, blood work showed potassium of 7.5, however potassium in the morning yesterday was 2.8.  Repeat potassium hour after K of 7 was 3.9  Elevated troponin: Serial troponin after the sustained VT episode in the morning of 8/24 was 837-->898.  This is likely demand ischemia in the setting of ventricular arrhythmia combined with known underlying disease.  Patient is not a candidate for cardiac catheterization given poor renal function and questionable compliance.  She denies  any chest pain prior to admission, since admission, she described episode of chest pain lasting about a second each time, since yesterday, although she is having longer episode of chest pain, none which are longer than a minute.  Given the transient nature of the chest pain, I think medical management is appropriate in this case.  Acute on chronic renal insufficiency: In the setting of DKA.  Improving with hydration.    DKA: She did not take her medication for 3 days.  Came in with nausea, vomiting and diarrhea  Dehydration: Well hydrated since admission.  HFrEF: We will need to monitor carefully for volume overload.  Due to dehydration and acute kidney injury, she has been well-hydrated, her I/O since admission is + 8.6 L of fluid.  CAD: h/o RCA PCI twice.  Possible ESBL UTI: Treated with meropenem  Hypertension: restart coreg at lower dose of 6.'25mg'$  BID, uptitrate as BP allows  Hyperlipidemia  DM2  Morbid obesity   Risk Assessment/Risk  Scores:     HEAR Score (for undifferentiated chest pain):     New York Heart Association (NYHA) Functional Class NYHA Class III  CHA2DS2-VASc Score = 6   This indicates a 9.7% annual risk of stroke. The patient's score is based upon: CHF History: 1 HTN History: 1 Diabetes History: 1 Stroke History: 0 Vascular Disease History: 1 Age Score: 1 Gender Score: 1         For questions or updates, please contact [place] Please consult [email or web address] for contact info under    Hilbert Corrigan, [place]  01/17/2022 5:07 PM  Patient seen and examined with Almyra Deforest PA.  Agree as above, with the following exceptions and changes as noted below.  Patient is a medically complex 71 year old female who presented 3 days ago in DKA.  Cardiology consulted for wide-complex tachycardia suspicious for sustained ventricular tachycardia which occurred overnight.  Patient notes she was sleeping during this episode.  There is no documentation of rapid response or code during this event and it does not appear that she dropped her blood pressure to a worrisome level during this.  Telemetry reviewed extensively.  It appears that she went into a similar morphology wide-complex tachycardia from atrial fibrillation with rapid ventricular response, and this wide-complex tachycardia was at a rate of 180 bpm.  I do see a retrograde atrial activity relatively consistently, but with the patient's known ischemic heart disease with reduced ejection fraction and EF of 30 to 35%, troponin elevation, and metabolic derangements, ventricular tachycardia is likely.  She remains in atrial flutter with rapid ventricular response currently at a rate of approximately 125.  Gen: In pain but no distress, CV: Tachycardic and irregular, no murmurs, Lungs: clear, Abd: soft, Extrem: 1+ puffy edema neuro/Psych: alert and oriented x 3, normal mood and affect. All available labs, radiology testing, previous records reviewed.  Patient has been  resuscitated for DKA and is being treated for acute kidney injury on chronic renal disease as well as urinary tract infection.  Her baseline creatinine is prohibitive to ischemic evaluation with coronary angiography.  Medical management is advised.  For both atrial and ventricular arrhythmias, IV amiodarone would be reasonable to start.  If she does not have IV access which is amenable to IV amiodarone, would start amiodarone 400 mg twice daily orally given similar bioavailability.  Aim for 10 g load given concern for ventricular tachycardia.  She should have a follow-up visit with electrophysiology as an outpatient and we will save the telemetry  strips for review.  Unfortunately despite the duration she spent in this wide-complex tachycardia, twelve-lead ECG was not obtained at that time.  If she has recurrent concerns for VT, please obtain a twelve-lead ECG during that time for best discrimination of rhythm.  Medical therapy for CAD and ischemic heart disease as above.  Elouise Munroe, MD 01/17/22 5:08 PM

## 2022-01-17 NOTE — Progress Notes (Addendum)
NAME:  Susan [name], MRN:  [id number], DOB:  December 30, 1950, LOS: 3 ADMISSION DATE:  01/14/2022, CONSULTATION DATE:  8/22 REFERRING MD:  Dr. Pearline Cables EDP, CHIEF COMPLAINT:  DKA   History of Present Illness:  71 year old female with PMH as below, which is significant for CAD (s/p PCI in 2000 and 2007), HFrEF (LVEF 30-35%), CKD IV with baseline creatinine 3.5, uncontrolled DM 2, HTN, and LLE venous ulcer. She presented to [facility] ED 8/22 with complaints of N/V/D, abdominal pain, and chest pain. CBG in triage noted at > 600. Reported symptoms for the past few weeks. Has not had water at home for the past 3 days. Has been non-compliant with insulin for 3 days PTA as well. She was lethargic, but arousable upon being roomed. Laboratory evaluation is significant for Glucose > 12000, K 6, Creatinine 5.09, BUN 96, venous pH 7.07. The patient was given 500cc bolus, started on an insulin infusion, and PCCM was asked to admit.   Pertinent  Medical History  HFrEF (EF 25-30% 7/23) CAD s/p PCI in 2000 and 2007 CKD stage IV Type 2 DM  Significant Hospital Events: Including procedures, antibiotic start and stop dates in addition to other pertinent events   8/22 Admitted to ICU 8/23 somnolent, CT head negative 8/24 transitioned off Endotool, eeg triphasic consistent with encephalopathy 8/25 taking PO, off D5  Interim History / Subjective:  Rhythm concerning for VTACH, elevated troponin and K 7.5. Heparin initiated and echo ordered.  Subjective: Awake and oriented. Eating breakfast. Complains of weakness and trouble feeding self  Objective   Blood pressure 135/63, pulse (!) 117, temperature 98.6 F (37 C), temperature source Axillary, resp. rate 12, height '5\' 6"'$  (1.676 m), weight 111 kg, SpO2 99 %.        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 Her mentation has improved, able to feed herself with difficulty this morning.  Complains of diffuse weakness. - PT/OT  DKA- resolved Hypokalemia Off endotool. Started on SubQ insulin yesterday. CBG elevated this morning. Still has central line. Peripheral access has been difficult.  Glucose elevated overnight in setting of D5 drip. - continue levemir 15 units BID -Novolog 2-4-6 SSI q 4 hrs -consider cortrak 8/25 if not improved -BMP daily - remove central line  ACS Elevated troponin Yesterday evening, Patient noted to have elevation in troponin up to 800's with 4-5 minute run of rhythm concerning for [facility] which spontaneously resolved. Potassium also noted ot have increased from 2.8 to 7.5 with mag 1.4 Patient was started on heparin and ECHO was ordered. Electrolytes have been repleted and wnl this morning.  - continue heparin pending formal cardiology consult  CAD PCI in 2000 and 2007. Troponin were 50-86 and EKFG showed RBBB that was present on prior.  ?UTI Previous cultures  with klebsiella resistant to ampicillin and enterobacter resistant to cefazolin, cefepime, pip/tazo, and bactrim. Both sensitive to cipro. UA with moderate Hgb, leukocytes, WBC, and bacteria. Urine culture grew multiple species. -merrem for 3 days, last dose 8/25  Chronic HFrEF - Telemetry monitoring.  -metoprolol '5mg'$  q 6 hrs PRN for HR  >120 - holding home atorvastatin, asas, carvedilol, clopidogrel, isosorbide, metolazone, sitagliptin while NPO, hypotensive.    AKI in CKD stage IV Baseline creatinine 3.5, elevated to 5.2 on admission. Creatinine improved to 4.25 today. Urine output has increased with 1L output yesterday.  -Strict I and Os -foley catheter in place  Nocturnal hypoxia -continue 2L qhs  Best Practice (right click and "Reselect all SmartList Selections" daily)   Diet/type: NPO DVT prophylaxis: prophylactic heparin  GI prophylaxis: N/A Lines: Central line Foley:  Yes, and it is still needed Code Status:  full code Last date of multidisciplinary goals of care discussion Rheba Diamond updated by phone [phone]  Daleen Bo. Masters, D.O.  Internal Medicine Resident, PGY-2 [name]  7:42 AM, 01/17/2022

## 2022-01-17 NOTE — Progress Notes (Signed)
  Echocardiogram 2D Echocardiogram has been performed.  Susan [name] 01/17/2022, 10:58 AM

## 2022-01-17 NOTE — Progress Notes (Signed)
ANTICOAGULATION CONSULT NOTE   Pharmacy Consult for heparin infusion Indication: chest pain/ACS  Allergies  Allergen Reactions   Keflex [Cephalexin] Diarrhea   Codeine Nausea And Vomiting and Other (See Comments)    Patient Measurements: Height: '5\' 6"'$  (167.6 cm) Weight: 111 kg (244 lb 11.4 oz) IBW/kg (Calculated) : 59.3 Heparin Dosing Weight: 84.3  Vital Signs: Temp: 98.4 F (36.9 C) (08/25 1513) Temp Source: Oral (08/25 1513) BP: 121/77 (08/25 1009) Pulse Rate: 115 (08/25 1009)  Labs: Recent Labs    01/14/22 1650 01/14/22 1654 01/14/22 2245 01/15/22 0135 01/15/22 1633 01/15/22 2114 01/16/22 0152 01/16/22 0552 01/16/22 1345 01/16/22 1559 01/17/22 0108 01/17/22 1508  HGB 11.6*   < >  --    < > 12.6  --  11.9*  --   --   --  10.5*  --   HCT 41.4   < >  --    < > 37.0  --  31.8*  --   --   --  30.5*  --   PLT 162  --   --   --   --   --  119*  --   --   --  86*  --   APTT  --   --  24  --   --   --   --   --   --   --   --   --   LABPROT  --   --  16.2*  --   --   --   --   --   --   --   --   --   INR  --   --  1.3*  --   --   --   --   --   --   --   --   --   HEPARINUNFRC  --   --   --   --   --   --   --   --   --   --  <0.10* <0.10*  CREATININE 5.09*   < > 5.29*   < >  --    < > 4.27*   < > 3.66* 3.92* 3.68*  --   CKTOTAL  --   --   --   --   --   --   --   --  114  --   --   --   TROPONINIHS 55*  --  82*  --   --   --   --   --  837* 898*  --   --    < > = values in this interval not displayed.    Estimated Creatinine Clearance: 17.5 mL/min (A) (by C-G formula based on SCr of 3.68 mg/dL (H)).   Medical History: Past Medical History:  Diagnosis Date   Acute MI ([place]) 1999; 2007   Anemia    hx   Anginal pain ([place])    Anxiety    ARF (acute renal failure) ([place]) 06/2017    Kidney Asso   Arthritis    "generalized" (03/15/2014)   CAD (coronary artery disease)    MI in 2000 - MI  2007 - treated bare metal stent (no nuclear since then as 9/11)    Carotid artery disease (H[facility])    CHF (congestive heart failure) ([place])    Chronic diastolic heart failure ([place])    a) ECHO (08/2013) EF 55-60% and RV function nl b) RHC (08/2013) RA 4, RV  30/5/7, PA 25/10 (16), PCWP 7, Fick CO/CI 6.3/2.7, PVR 1.5 WU, PA 61 and 66%   Daily headache    "~ every other day; since I fell in June" (03/15/2014)   Depression    Dyslipidemia    Dyspnea    Exertional shortness of breath    HTN (hypertension)    Hypothyroidism    Neuropathy    Obesity    Osteoarthritis    Peripheral neuropathy    PONV (postoperative nausea and vomiting)    RBBB (right bundle branch block)    Old   Stroke ([place])    mini strokes   Syncope    likely due to low blood sugar   Tachycardia    Sinus tachycardia   Type II diabetes mellitus (HCC)    Type II   Urinary incontinence    Venous insufficiency     Assessment: SS is a 71 year old female admitted for DKA. Pharmacy has been consulted for a heparin infusion for chest pain/ACS. Cardiology to evaluate.   Heparin level remains undetectable Plts down (162>>119>>86) - 4T score 1 Hemoglobin trended down some at 10.5/30.5.  No bleeding or issue with infusion per RN.  SCr improving at 3.68.  Goal of Therapy:  Heparin level 0.3-0.7 units/ml Monitor platelets by anticoagulation protocol: Yes   Plan:  No bolus with CBC trending down.  Increase heparin to 1600 units/hr Recheck Heparin level in 8 hours.  Watch platelet trend closely   Sloan Leiter, PharmD, BCPS, BCCCP Clinical Pharmacist Please refer to [facility] for [place] numbers 01/17/2022, 3:57 PM

## 2022-01-17 NOTE — Evaluation (Signed)
Clinical/Bedside Swallow Evaluation Patient Details  Name: Susan [name] MRN: [id number] Date of Birth: 1950/10/26  Today's Date: 01/17/2022 Time: SLP Start Time (ACUTE ONLY): 65 SLP Stop Time (ACUTE ONLY): 1250 SLP Time Calculation (min) (ACUTE ONLY): 25 min  Past Medical History:  Past Medical History:  Diagnosis Date   Acute MI ([place]) 1999; 2007   Anemia    hx   Anginal pain ([place])    Anxiety    ARF (acute renal failure) ([place]) 06/2017   [facility]   Arthritis    "generalized" (03/15/2014)   CAD (coronary artery disease)    MI in 2000 - MI  2007 - treated bare metal stent (no nuclear since then as 9/11)   Carotid artery disease (HCC)    CHF (congestive heart failure) ([place])    Chronic diastolic heart failure ([place])    a) ECHO (08/2013) EF 55-60% and RV function nl b) RHC (08/2013) RA 4, RV 30/5/7, PA 25/10 (16), PCWP 7, Fick CO/CI 6.3/2.7, PVR 1.5 WU, PA 61 and 66%   Daily headache    "~ every other day; since I fell in June" (03/15/2014)   Depression    Dyslipidemia    Dyspnea    Exertional shortness of breath    HTN (hypertension)    Hypothyroidism    Neuropathy    Obesity    Osteoarthritis    Peripheral neuropathy    PONV (postoperative nausea and vomiting)    RBBB (right bundle branch block)    Old   Stroke ([place])    mini strokes   Syncope    likely due to low blood sugar   Tachycardia    Sinus tachycardia   Type II diabetes mellitus ([place])    Type II   Urinary incontinence    Venous insufficiency    Past Surgical History:  Past Surgical History:  Procedure Laterality Date   ABDOMINAL HYSTERECTOMY  1980's   AMPUTATION Right 02/24/2018   Procedure: RIGHT FOOT GREAT TOE AND 2ND TOE AMPUTATION;  Surgeon: Newt Minion, MD;  Location: [facility];  Service: Orthopedics;  Laterality: Right;   AMPUTATION Right 04/30/2018   Procedure: RIGHT TRANSMETATARSAL AMPUTATION;  Surgeon: Newt Minion, MD;  Location: [facility];  Service: Orthopedics;  Laterality: Right;    BIOPSY  05/27/2020   Procedure: BIOPSY;  Surgeon: [name], DO;  [facility];  Service: Endoscopy;;   CATARACT EXTRACTION, BILATERAL Bilateral ?2013   COLONOSCOPY W/ POLYPECTOMY     COLONOSCOPY WITH PROPOFOL N/A 03/13/2019   Procedure: COLONOSCOPY WITH PROPOFOL;  Surgeon: Jerene Bears, MD;  Location: [facility];  Service: Gastroenterology;  Laterality: N/A;   [place]; 2007   "1 + 1"   ESOPHAGOGASTRODUODENOSCOPY (EGD) WITH PROPOFOL N/A 03/13/2019   Procedure: ESOPHAGOGASTRODUODENOSCOPY (EGD) WITH PROPOFOL;  Surgeon: Jerene Bears, MD;  Location: [facility];  Service: Gastroenterology;  Laterality: N/A;   ESOPHAGOGASTRODUODENOSCOPY (EGD) WITH PROPOFOL N/A 05/27/2020   Procedure: ESOPHAGOGASTRODUODENOSCOPY (EGD) WITH PROPOFOL;  Surgeon: [name], DO;  [facility];  Service: Endoscopy;  Laterality: N/A;   EYE SURGERY Bilateral    [facility]   HEMOSTASIS CLIP PLACEMENT  03/13/2019   Procedure: HEMOSTASIS CLIP PLACEMENT;  Surgeon: Jerene Bears, MD;  Location: [facility];  Service: Gastroenterology;;   KNEE ARTHROSCOPY Left 10/25/2006   POLYPECTOMY  03/13/2019   Procedure: POLYPECTOMY;  Surgeon: Jerene Bears, MD;  Location: [facility];  Service: Gastroenterology;;   RIGHT  HEART CATH N/A 07/24/2017   Procedure: RIGHT HEART CATH;  Surgeon: Jolaine Artist, MD;  Location: [facility];  Service: Cardiovascular;  Laterality: N/A;   RIGHT HEART CATHETERIZATION N/A 09/22/2013   Procedure: RIGHT HEART CATH;  Surgeon: Jolaine Artist, MD;  Location: [facility];  Service: Cardiovascular;  Laterality: N/A;   SHOULDER ARTHROSCOPY WITH OPEN ROTATOR CUFF REPAIR Right 03/14/2014   Procedure: RIGHT SHOULDER ARTHROSCOPY WITH BICEPS RELEASE, OPEN SUBSCAPULA REPAIR, OPEN SUPRASPINATUS REPAIR.;  Surgeon: Meredith Pel, MD;  Location: [facility];  Service: Orthopedics;  Laterality: Right;   TOE AMPUTATION Right 02/24/2018    GREAT TOE AND 2ND TOE AMPUTATION   TUBAL LIGATION  1970's   HPI: 71 year old female with PMH as below, which is significant for CAD (s/p PCI in 2000 and 2007), HFrEF (LVEF 30-35%), CKD IV with baseline creatinine 3.5, uncontrolled DM 2, HTN, and LLE venous ulcer. She presented [facility] ED 8/22 with complaints of N/V/D, abdominal pain, and chest pain. CBG in triage noted at > 600. Reported symptoms for the past few weeks. Has not had water at home for the past 3 days. Has been non-compliant with insulin for 3 days PTA as well. Intermittent lethargy/altered mentation per nursing report.  BSE generated; pt currently on thin liquid diet.      Assessment / Plan / Recommendation  Clinical Impression  Pt seen for clinical swallowing evaluation with cognitive-based oropharyngeal dysphagia noted c/b decreased oral manipulation/oral holding and suspected delay in the initiation of the swallow with delayed cough observed with larger boluses of thin liquids via straw.  Cough eliminated with smaller boluses and min verbal cues to "take small sips" during consumption.  Pt exhibited difficulty with self-feeding d/t edematous extremities.  Pt c/o "raw throat" and vocal quality noted to be min hoarse during speaking attempts.  Decreased processing overall observed during assessment which impacts swallowing function.  ST will f/u for diet tolerance and upgrade as pt able with pain management and improvement in mentation.  Full liquids recommended per pt request and with swallowing precautions in place during consumption.  Thank you for this consult. SLP Visit Diagnosis: Dysphagia, unspecified (R13.10)    Aspiration Risk  Mild aspiration risk    Diet Recommendation   Full liquids  Medication Administration: Whole meds with liquid (as tolerated)    Other  Recommendations Oral Care Recommendations: Oral care BID;Staff/trained caregiver to provide oral care    Recommendations for follow up therapy are one  component of a multi-disciplinary discharge planning process, led by the attending physician.  Recommendations may be updated based on patient status, additional functional criteria and insurance authorization.  Follow up Recommendations Follow physician's recommendations for discharge plan and follow up therapies      Assistance Recommended at Discharge PRN  Functional Status Assessment Patient has had a recent decline in their functional status and demonstrates the ability to make significant improvements in function in a reasonable and predictable amount of time.  Frequency and Duration min 2x/week  1 week       Prognosis Prognosis for Safe Diet Advancement: Good      Swallow Study   General Date of Onset: 01/14/22 Type of Study: Bedside Swallow Evaluation Previous Swallow Assessment: n/a Diet Prior to this Study: Thin liquids Temperature Spikes Noted: No Respiratory Status: Room air History of Recent Intubation: No Behavior/Cognition: Alert;Confused;Requires cueing;Other (Comment) (decreased processing) Oral Cavity Assessment: Within Functional Limits Oral Care Completed by SLP: Recent completion by  staff Oral Cavity - Dentition: Adequate natural dentition Vision: Functional for self-feeding Self-Feeding Abilities: Needs assist;Other (Comment) (d/t edematous hands) Patient Positioning: Upright in bed Baseline Vocal Quality: Hoarse Volitional Cough: Strong Volitional Swallow: Able to elicit    Oral/Motor/Sensory Function Overall Oral Motor/Sensory Function: Generalized oral weakness   Ice Chips Ice chips: Not tested   Thin Liquid Thin Liquid: Impaired Presentation: Cup;Straw Pharyngeal  Phase Impairments: Suspected delayed Swallow;Cough - Delayed (with larger boluses of thin)    Nectar Thick Nectar Thick Liquid: Not tested   Honey Thick Honey Thick Liquid: Not tested   Puree Puree: Impaired Presentation: Spoon Oral Phase Impairments: Reduced lingual  movement/coordination Oral Phase Functional Implications: Prolonged oral transit Pharyngeal Phase Impairments: Suspected delayed Swallow   Solid     Solid: Not tested Other Comments: Pt refusal d/t "sore throat"      Elvina Sidle, M.S., CCC-SLP 01/17/2022,1:09 PM

## 2022-01-17 NOTE — Progress Notes (Signed)
This RN performed second assessment of patient for PIV placement. Ultrasound used. Only one suitable vein for PIV placement noted to be right upper arm cephalic vein. Currently has two infusions ordered. Discussed with Primary RN who states plan is to discontinue heparin gtt and the one PIV will be sufficient. Most recent GFR 13 so not midline candidate. USG PIV placed at this time.

## 2022-01-17 NOTE — Progress Notes (Signed)
ANTICOAGULATION CONSULT NOTE   Pharmacy Consult for heparin infusion Indication: chest pain/ACS  Allergies  Allergen Reactions   Keflex [Cephalexin] Diarrhea   Codeine Nausea And Vomiting and Other (See Comments)    Patient Measurements: Height: '5\' 6"'$  (167.6 cm) Weight: 111 kg (244 lb 11.4 oz) IBW/kg (Calculated) : 59.3 Heparin Dosing Weight: 84.3  Vital Signs: Temp: 97.8 F (36.6 C) (08/24 1952) Temp Source: Oral (08/24 1952) BP: 105/55 (08/25 0100) Pulse Rate: 71 (08/25 0100)  Labs: Recent Labs    01/14/22 1650 01/14/22 1654 01/14/22 2245 01/15/22 0135 01/15/22 1633 01/15/22 2114 01/16/22 0152 01/16/22 0552 01/16/22 1345 01/16/22 1559 01/17/22 0108  HGB 11.6*   < >  --    < > 12.6  --  11.9*  --   --   --  10.5*  HCT 41.4   < >  --    < > 37.0  --  31.8*  --   --   --  30.5*  PLT 162  --   --   --   --   --  119*  --   --   --  86*  APTT  --   --  24  --   --   --   --   --   --   --   --   LABPROT  --   --  16.2*  --   --   --   --   --   --   --   --   INR  --   --  1.3*  --   --   --   --   --   --   --   --   HEPARINUNFRC  --   --   --   --   --   --   --   --   --   --  <0.10*  CREATININE 5.09*   < > 5.29*   < >  --    < > 4.27*   < > 3.66* 3.92* 3.68*  CKTOTAL  --   --   --   --   --   --   --   --  114  --   --   TROPONINIHS 55*  --  82*  --   --   --   --   --  837* 898*  --    < > = values in this interval not displayed.     Estimated Creatinine Clearance: 17.5 mL/min (A) (by C-G formula based on SCr of 3.68 mg/dL (H)).   Medical History: Past Medical History:  Diagnosis Date   Acute MI ([place]) 1999; 2007   Anemia    hx   Anginal pain ([place])    Anxiety    ARF (acute renal failure) ([place]) 06/2017   [facility]   Arthritis    "generalized" (03/15/2014)   CAD (coronary artery disease)    MI in 2000 - MI  2007 - treated bare metal stent (no nuclear since then as 9/11)   Carotid artery disease (HCC)    CHF (congestive heart failure)  (HCC)    Chronic diastolic heart failure ([place])    a) ECHO (08/2013) EF 55-60% and RV function nl b) RHC (08/2013) RA 4, RV 30/5/7, PA 25/10 (16), PCWP 7, Fick CO/CI 6.3/2.7, PVR 1.5 WU, PA 61 and 66%   Daily headache    "~ every other day; since I  fell in June" (03/15/2014)   Depression    Dyslipidemia    Dyspnea    Exertional shortness of breath    HTN (hypertension)    Hypothyroidism    Neuropathy    Obesity    Osteoarthritis    Peripheral neuropathy    PONV (postoperative nausea and vomiting)    RBBB (right bundle branch block)    Old   Stroke ([place])    mini strokes   Syncope    likely due to low blood sugar   Tachycardia    Sinus tachycardia   Type II diabetes mellitus (HCC)    Type II   Urinary incontinence    Venous insufficiency     Assessment: Susan [name] is a 71 year old female admitted for DKA. Pharmacy has been consulted for a heparin infusion for chest pain/ACS.   8/25 AM update:  Heparin level undetectable  Plts down (162>>119>>86)   Goal of Therapy:  Heparin level 0.3-0.7 units/ml Monitor platelets by anticoagulation protocol: Yes   Plan:  Inc heparin to 1300 units/hr 1100 heparin level Watch platelet trend closely   Narda Bonds, PharmD, BCPS Clinical Pharmacist Phone: [phone]

## 2022-01-18 DIAGNOSIS — I5022 Chronic systolic (congestive) heart failure: Secondary | ICD-10-CM | POA: Diagnosis not present

## 2022-01-18 DIAGNOSIS — I509 Heart failure, unspecified: Secondary | ICD-10-CM | POA: Diagnosis not present

## 2022-01-18 DIAGNOSIS — E1310 Other specified diabetes mellitus with ketoacidosis without coma: Secondary | ICD-10-CM | POA: Diagnosis not present

## 2022-01-18 DIAGNOSIS — I4891 Unspecified atrial fibrillation: Secondary | ICD-10-CM

## 2022-01-18 DIAGNOSIS — E1110 Type 2 diabetes mellitus with ketoacidosis without coma: Secondary | ICD-10-CM | POA: Diagnosis not present

## 2022-01-18 LAB — BASIC METABOLIC PANEL
Anion gap: 10 (ref 5–15)
BUN: 86 mg/dL — ABNORMAL HIGH (ref 8–23)
CO2: 23 mmol/L (ref 22–32)
Calcium: 8.2 mg/dL — ABNORMAL LOW (ref 8.9–10.3)
Chloride: 97 mmol/L — ABNORMAL LOW (ref 98–111)
Creatinine, Ser: 3.3 mg/dL — ABNORMAL HIGH (ref 0.44–1.00)
GFR, Estimated: 14 mL/min — ABNORMAL LOW (ref >60)
Glucose, Bld: 208 mg/dL — ABNORMAL HIGH (ref 70–99)
Potassium: 4 mmol/L (ref 3.5–5.1)
Sodium: 130 mmol/L — ABNORMAL LOW (ref 135–145)

## 2022-01-18 LAB — CBC
HCT: 29.4 % — ABNORMAL LOW (ref 36.0–46.0)
Hemoglobin: 10 g/dL — ABNORMAL LOW (ref 12.0–15.0)
MCH: 26.8 pg (ref 26.0–34.0)
MCHC: 34 g/dL (ref 30.0–36.0)
MCV: 78.8 fL — ABNORMAL LOW (ref 80.0–100.0)
Platelets: 81 10*3/uL — ABNORMAL LOW (ref 150–400)
RBC: 3.73 MIL/uL — ABNORMAL LOW (ref 3.87–5.11)
RDW: 17.8 % — ABNORMAL HIGH (ref 11.5–15.5)
WBC: 7.7 10*3/uL (ref 4.0–10.5)
nRBC: 0 % (ref 0.0–0.2)

## 2022-01-18 LAB — GLUCOSE, CAPILLARY
Glucose-Capillary: 79 mg/dL (ref 70–99)
Glucose-Capillary: 80 mg/dL (ref 70–99)
Glucose-Capillary: 145 mg/dL — ABNORMAL HIGH (ref 70–99)
Glucose-Capillary: 165 mg/dL — ABNORMAL HIGH (ref 70–99)
Glucose-Capillary: 146 mg/dL — ABNORMAL HIGH (ref 70–99)
Glucose-Capillary: 81 mg/dL (ref 70–99)
Glucose-Capillary: 114 mg/dL — ABNORMAL HIGH (ref 70–99)

## 2022-01-18 LAB — HEPARIN LEVEL (UNFRACTIONATED)
Heparin Unfractionated: <0.1 IU/mL — ABNORMAL LOW (ref 0.30–0.70)
Heparin Unfractionated: 0.2 IU/mL — ABNORMAL LOW (ref 0.30–0.70)
Heparin Unfractionated: 0.2 IU/mL — ABNORMAL LOW (ref 0.30–0.70)

## 2022-01-18 MED ORDER — LEVOTHYROXINE SODIUM 50 MCG PO TABS
50.0000 ug | ORAL_TABLET | Freq: Every day | ORAL | Status: DC
  Administered 2022-01-19 – 2022-01-23 (×5): 50 ug via ORAL
  Filled 2022-01-18 (×5): qty 1

## 2022-01-18 NOTE — Progress Notes (Signed)
ANTICOAGULATION CONSULT NOTE   Pharmacy Consult for heparin infusion Indication: chest pain/ACS, ?afib  Allergies  Allergen Reactions   Keflex [Cephalexin] Diarrhea   Codeine Nausea And Vomiting and Other (See Comments)    Patient Measurements: Height: '5\' 6"'$  (167.6 cm) Weight: 111 kg (244 lb 11.4 oz) IBW/kg (Calculated) : 59.3 Heparin Dosing Weight: 84.3  Vital Signs: Temp: 98.2 F (36.8 C) (08/26 0000) Temp Source: Oral (08/26 0000) BP: 95/48 (08/26 0223) Pulse Rate: 104 (08/26 0223)  Labs: Recent Labs    01/16/22 0152 01/16/22 0552 01/16/22 1345 01/16/22 1559 01/17/22 0108 01/17/22 1508 01/18/22 0109  HGB 11.9*  --   --   --  10.5*  --  10.0*  HCT 31.8*  --   --   --  30.5*  --  29.4*  PLT 119*  --   --   --  86*  --  81*  HEPARINUNFRC  --   --   --   --  <0.10* <0.10* <0.10*  CREATININE 4.27*   < > 3.66* 3.92* 3.68*  --   --   CKTOTAL  --   --  114  --   --   --   --   TROPONINIHS  --   --  837* 898*  --   --   --    < > = values in this interval not displayed.     Estimated Creatinine Clearance: 17.5 mL/min (A) (by C-G formula based on SCr of 3.68 mg/dL (H)).   Medical History: Past Medical History:  Diagnosis Date   Acute MI ([place]) 1999; 2007   Anemia    hx   Anginal pain ([place])    Anxiety    ARF (acute renal failure) ([place]) 06/2017   [facility]   Arthritis    "generalized" (03/15/2014)   CAD (coronary artery disease)    MI in 2000 - MI  2007 - treated bare metal stent (no nuclear since then as 9/11)   Carotid artery disease (HCC)    CHF (congestive heart failure) ([place])    Chronic diastolic heart failure ([place])    a) ECHO (08/2013) EF 55-60% and RV function nl b) RHC (08/2013) RA 4, RV 30/5/7, PA 25/10 (16), PCWP 7, Fick CO/CI 6.3/2.7, PVR 1.5 WU, PA 61 and 66%   Daily headache    "~ every other day; since I fell in June" (03/15/2014)   Depression    Dyslipidemia    Dyspnea    Exertional shortness of breath    HTN (hypertension)     Hypothyroidism    Neuropathy    Obesity    Osteoarthritis    Peripheral neuropathy    PONV (postoperative nausea and vomiting)    RBBB (right bundle branch block)    Old   Stroke ([place])    mini strokes   Syncope    likely due to low blood sugar   Tachycardia    Sinus tachycardia   Type II diabetes mellitus (HCC)    Type II   Urinary incontinence    Venous insufficiency     Assessment: Susan [name] is a 71 year old female admitted for DKA. Pharmacy has been consulted for a heparin infusion for chest pain/ACS. Also with some afib per cardiology note.   8/26 AM update:  Heparin level undetectable  Some stabilization in plts this AM (162>>119>>86>81)   Goal of Therapy:  Heparin level 0.3-0.7 units/ml Monitor platelets by anticoagulation protocol: Yes   Plan:  Inc  heparin to 1900 units/hr 1100 heparin level Watch platelet trend closely   Narda Bonds, PharmD, BCPS Clinical Pharmacist Phone: [phone]

## 2022-01-18 NOTE — Progress Notes (Signed)
[facility] for heparin infusion Indication: chest pain/ACS; Afib   Allergies  Allergen Reactions   Keflex [Cephalexin] Diarrhea   Codeine Nausea And Vomiting and Other (See Comments)    Patient Measurements: Height: '5\' 6"'$  (167.6 cm) Weight: 111 kg (244 lb 11.4 oz) IBW/kg (Calculated) : 59.3 Heparin Dosing Weight: 84.3  Vital Signs: Temp: 99.4 F (37.4 C) (08/26 1100) Temp Source: Axillary (08/26 1100) BP: 142/104 (08/26 1200) Pulse Rate: 98 (08/26 1321)  Labs: Recent Labs    01/16/22 0152 01/16/22 0152 01/16/22 0552 01/16/22 1345 01/16/22 1559 01/17/22 0108 01/17/22 1508 01/18/22 0109 01/18/22 1334  HGB 11.9*  --   --   --   --  10.5*  --  10.0*  --   HCT 31.8*  --   --   --   --  30.5*  --  29.4*  --   PLT 119*  --   --   --   --  86*  --  81*  --   HEPARINUNFRC  --    < >  --   --   --  <0.10* <0.10* <0.10* 0.20*  CREATININE 4.27*  --    < > 3.66* 3.92* 3.68*  --   --  3.30*  CKTOTAL  --   --   --  114  --   --   --   --   --   TROPONINIHS  --   --   --  837* 898*  --   --   --   --    < > = values in this interval not displayed.     Estimated Creatinine Clearance: 19.5 mL/min (A) (by C-G formula based on SCr of 3.3 mg/dL (H)).   Medical History: Past Medical History:  Diagnosis Date   Acute MI ([place]) 1999; 2007   Anemia    hx   Anginal pain ([place])    Anxiety    ARF (acute renal failure) ([place]) 06/2017   [facility]   Arthritis    "generalized" (03/15/2014)   CAD (coronary artery disease)    MI in 2000 - MI  2007 - treated bare metal stent (no nuclear since then as 9/11)   Carotid artery disease (HCC)    CHF (congestive heart failure) ([place])    Chronic diastolic heart failure ([place])    a) ECHO (08/2013) EF 55-60% and RV function nl b) RHC (08/2013) RA 4, RV 30/5/7, PA 25/10 (16), PCWP 7, Fick CO/CI 6.3/2.7, PVR 1.5 WU, PA 61 and 66%   Daily headache    "~ every other day; since I fell in June" (03/15/2014)    Depression    Dyslipidemia    Dyspnea    Exertional shortness of breath    HTN (hypertension)    Hypothyroidism    Neuropathy    Obesity    Osteoarthritis    Peripheral neuropathy    PONV (postoperative nausea and vomiting)    RBBB (right bundle branch block)    Old   Stroke ([place])    mini strokes   Syncope    likely due to low blood sugar   Tachycardia    Sinus tachycardia   Type II diabetes mellitus (HCC)    Type II   Urinary incontinence    Venous insufficiency     Assessment: SS is a 71 year old female admitted for DKA. Pharmacy has been consulted for a heparin infusion for chest pain/ACS. Cardiology  to evaluate.   Heparin level of 0.2 is subtherapeutic on heparin 1900 units/hr. Level drawn appropriately. Hgb 10. Plts 81. No bleeding noted  Goal of Therapy:  Heparin level 0.3-0.7 units/ml Monitor platelets by anticoagulation protocol: Yes   Plan:  Increase heparin to 2050 units/hr  Check 8 hr heparin level  Monitor heparin level, CBC and s/s of bleeding daily  Watch platelet trend closely   Cristela Felt, PharmD, BCPS Clinical Pharmacist 01/18/2022 2:19 PM

## 2022-01-18 NOTE — Progress Notes (Signed)
Rounding Note    Patient Name: Susan [name] Date of Encounter: 01/18/2022  [place] Cardiologist: Glori Bickers, MD   Subjective   Patient sleeping in upright bed  PLT 81 Crt 4s->3s  Inpatient Medications    Scheduled Meds:  amiodarone  400 mg Oral BID   aspirin  81 mg Per NG tube Daily   carvedilol  6.25 mg Oral BID WC   Chlorhexidine Gluconate Cloth  6 each Topical Daily   insulin aspart  2-6 Units Subcutaneous Q4H   insulin detemir  15 Units Subcutaneous BID   mouth rinse  15 mL Mouth Rinse 4 times per day   sodium chloride flush  10-40 mL Intracatheter Q12H   torsemide  80 mg Oral BID   Continuous Infusions:  sodium chloride 10 mL/hr at 01/18/22 0600   heparin 1,900 Units/hr (01/18/22 0600)   PRN Meds: sodium chloride, acetaminophen, dextrose, metoprolol tartrate, mouth rinse, oxyCODONE, sodium chloride flush   Vital Signs    Vitals:   01/18/22 0800 01/18/22 0900 01/18/22 1000 01/18/22 1100  BP: 107/70 120/69 (!) 121/57 105/63  Pulse: (!) 111 (!) 116 (!) 108 96  Resp: '17 19 16 15  '$ Temp:    99.4 F (37.4 C)  TempSrc:    Axillary  SpO2: 94% 91% (!) 89% 94%  Weight:      Height:        Intake/Output Summary (Last 24 hours) at 01/18/2022 1149 Last data filed at 01/18/2022 0600 Gross per 24 hour  Intake 573.85 ml  Output 750 ml  Net -176.15 ml      01/16/2022    4:00 AM 01/15/2022   12:47 AM 12/20/2021    3:12 PM  Last 3 Weights  Weight (lbs) 244 lb 11.4 oz 238 lb 8.6 oz 239 lb  Weight (kg) 111 kg 108.2 kg 108.41 kg      Telemetry    Afib rate controlled  - Personally Reviewed  ECG    NA - Personally Reviewed  Physical Exam   Vitals:   01/18/22 1100 01/18/22 1200  BP: 105/63 (!) 142/104  Pulse: 96 98  Resp: 15 15  Temp: 99.4 F (37.4 C)   SpO2: 94% 94%    GEN: lying upright in bed HEENT: dry MM Neck: No sig. JVD Cardiac: RRR, no murmurs, rubs, or gallops.  Respiratory: Clear to auscultation bilaterally. GI:  Soft, nontender, non-distended  MS: trace edema BL; No deformity. Neuro:  Nonfocal  Psych: Normal affect   Labs    High Sensitivity Troponin:   Recent Labs  Lab 01/14/22 1650 01/14/22 2245 01/16/22 1345 01/16/22 1559  TROPONINIHS 55* 82* 837* 898*     Chemistry Recent Labs  Lab 01/14/22 1650 01/14/22 1654 01/14/22 2245 01/15/22 0135 01/16/22 1345 01/16/22 1559 01/17/22 0108  NA 112*   < > 114*   < > 122* 129* 130*  K 6.0*   < > 4.0   < > >7.5* 3.9 3.7  CL 67*   < > 73*   < > 99 94* 94*  CO2 9*  --  14*   < > 21* 22 24  GLUCOSE >1,200*   < > >1,200*   < > 205* 193* 219*  BUN 96*   < > 96*   < > 87* 93* 90*  CREATININE 5.09*   < > 5.29*   < > 3.66* 3.92* 3.68*  CALCIUM 8.1*  --  7.8*   < > 7.6* 8.3* 8.3*  MG  --   --  2.0  --  1.4*  --  2.4  PROT 6.3*  --   --   --   --   --   --   ALBUMIN 3.4*  --   --   --   --   --   --   AST 28  --   --   --   --   --   --   ALT 24  --   --   --   --   --   --   ALKPHOS 103  --   --   --   --   --   --   BILITOT 1.9*  --   --   --   --   --   --   GFRNONAA 8*  --  8*   < > 13* 12* 13*  ANIONGAP 36*  --  27*   < > 2* 13 12   < > = values in this interval not displayed.    Lipids No results for input(s): "CHOL", "TRIG", "HDL", "LABVLDL", "LDLCALC", "CHOLHDL" in the last 168 hours.  Hematology Recent Labs  Lab 01/16/22 0152 01/17/22 0108 01/18/22 0109  WBC 9.5 7.8 7.7  RBC 4.29 3.88 3.73*  HGB 11.9* 10.5* 10.0*  HCT 31.8* 30.5* 29.4*  MCV 74.1* 78.6* 78.8*  MCH 27.7 27.1 26.8  MCHC 37.4* 34.4 34.0  RDW 17.0* 17.9* 17.8*  PLT 119* 86* 81*   Thyroid No results for input(s): "TSH", "FREET4" in the last 168 hours.  BNP Recent Labs  Lab 01/15/22 0132  BNP 1,929.8*    DDimer No results for input(s): "DDIMER" in the last 168 hours.   Radiology    ECHOCARDIOGRAM LIMITED  Result Date: 01/17/2022    ECHOCARDIOGRAM LIMITED REPORT   Patient Name:   LYRICA MCCLARTY Date of Exam: 01/17/2022 Medical Rec #:  [id number]    Height:        66.0 in Accession #:    [id number]   Weight:       244.7 lb Date of Birth:  [birth year]    BSA:          2.179 m Patient Age:    40 years     BP:           121/77 mmHg Patient Gender: F            HR:           114 bpm. Exam Location:  Inpatient Procedure: Limited Echo Indications:    congestive heart failure  History:        Patient has prior history of Echocardiogram examinations, most                 recent 12/10/2021. Arrythmias:RBBB; Risk Factors:Diabetes,                 Hypertension and Dyslipidemia.  Sonographer:    Johny Chess RDCS Referring Phys: [id number] Kipp Brood  Sonographer Comments: Patient is obese. IMPRESSIONS  1. Left ventricular ejection fraction, by estimation, is 30 to 35%. The left ventricle has moderately decreased function. The left ventricle demonstrates regional wall motion abnormalities (see scoring diagram/findings for description). Left ventricular  diastolic function could not be evaluated.  2. Right ventricular systolic function is moderately reduced. The right ventricular size is moderately enlarged. There is mildly elevated pulmonary artery systolic pressure.  3. The mitral valve is abnormal. Trivial mitral valve regurgitation. No evidence of mitral stenosis.  4. Tricuspid  valve regurgitation is mild to moderate.  5. The aortic valve is normal in structure. Aortic valve regurgitation is not visualized. No aortic stenosis is present.  6. The inferior vena cava is normal in size with greater than 50% respiratory variability, suggesting right atrial pressure of 3 mmHg. FINDINGS  Left Ventricle: Left ventricular ejection fraction, by estimation, is 30 to 35%. The left ventricle has moderately decreased function. The left ventricle demonstrates regional wall motion abnormalities. The left ventricular internal cavity size was normal in size. There is no left ventricular hypertrophy. Left ventricular diastolic function could not be evaluated. Left ventricular diastolic function  could not be evaluated due to nondiagnostic images.  LV Wall Scoring: The entire inferior wall and apex are akinetic. The entire anterior wall, entire lateral wall, and entire septum are hypokinetic. Right Ventricle: The right ventricular size is moderately enlarged. No increase in right ventricular wall thickness. Right ventricular systolic function is moderately reduced. There is mildly elevated pulmonary artery systolic pressure. The tricuspid regurgitant velocity is 3.16 m/s, and with an assumed right atrial pressure of 3 mmHg, the estimated right ventricular systolic pressure is 09.3 mmHg. Left Atrium: Left atrial size was normal in size. Right Atrium: Right atrial size was normal in size. Pericardium: There is no evidence of pericardial effusion. Mitral Valve: The mitral valve is abnormal. Trivial mitral valve regurgitation. No evidence of mitral valve stenosis. Tricuspid Valve: The tricuspid valve is normal in structure. Tricuspid valve regurgitation is mild to moderate. No evidence of tricuspid stenosis. Aortic Valve: The aortic valve is normal in structure. Aortic valve regurgitation is not visualized. No aortic stenosis is present. Pulmonic Valve: The pulmonic valve was normal in structure. Pulmonic valve regurgitation is not visualized. No evidence of pulmonic stenosis. Aorta: The aortic root is normal in size and structure. Venous: The inferior vena cava is normal in size with greater than 50% respiratory variability, suggesting right atrial pressure of 3 mmHg. IAS/Shunts: No atrial level shunt detected by color flow Doppler. LEFT VENTRICLE PLAX 2D LVIDd:         5.40 cm LVIDs:         4.50 cm LV PW:         1.00 cm LV IVS:        0.80 cm  IVC IVC diam: 1.70 cm LEFT ATRIUM         Index LA diam:    4.40 cm 2.02 cm/m   AORTA Ao Asc diam: 2.80 cm TRICUSPID VALVE TR Peak grad:   39.9 mmHg TR Vmax:        316.00 cm/s Skeet Latch MD Electronically signed by Skeet Latch MD Signature Date/Time:  01/17/2022/12:38:00 PM    Final     Cardiac Studies   TTE 01/17/2022 1. Left ventricular ejection fraction, by estimation, is 30 to 35%. The  left ventricle has moderately decreased function. The left ventricle  demonstrates regional wall motion abnormalities (see scoring  diagram/findings for description). Left ventricular   diastolic function could not be evaluated.   2. Right ventricular systolic function is moderately reduced. The right  ventricular size is moderately enlarged. There is mildly elevated  pulmonary artery systolic pressure.   3. The mitral valve is abnormal. Trivial mitral valve regurgitation. No  evidence of mitral stenosis.   4. Tricuspid valve regurgitation is mild to moderate.   5. The aortic valve is normal in structure. Aortic valve regurgitation is  not visualized. No aortic stenosis is present.   6. The inferior vena  cava is normal in size with greater than 50%  respiratory variability, suggesting right atrial pressure of 3 mmHg.   Patient Profile     Aryam Zhan is a 71 y.o. female with a hx of morbid obesity, CAD with PCI to RCA, HFrEF, CKD stage IV, hypertension, hyperlipidemia, DM2, RBBB, TIA and PAD s/p transmetatarsal amputation who is being seen 01/17/2022 for the evaluation of elevated troponin and VT at the request of Dr. Tacy Learn.  Assessment & Plan    Atrial Fibrillation: rate controlled. Continue amiodarone 400 mg BID. Continue coreg 6.25 mg BID. Can continue heparin gtt. Can transition to a DOAC when she is more alert and closer to discharge  Wide Complex Rhythm: VT vs. Aberrancy. No recurrence. Troponin 837-->898. No ischemic evaluation planned   HFrEF: EF 30-35%; unchanged. BB per above. Continue torsemide 80 mg BID  DM2 -S/p DKA.   CAD h/o RCA PCIx2  Possible ESBL UTI - on meropenem   For questions or updates, please contact [place] Please consult [email or web address] for contact info under        Signed, Janina Mayo, MD   01/18/2022, 11:49 AM

## 2022-01-18 NOTE — Evaluation (Signed)
Physical Therapy Evaluation Patient Details Name: Susan [name] MRN: [id number] DOB: 01/12/1951 Today's Date: 01/18/2022  History of Present Illness  71 yo female admitted 8/22 with DKA.8/24 Afib with RVR. PMHx: obesity, CAD, HFrEF, CKD, HTN, HLD, DM, RBBB, PAD, neuropathy, Rt transmet amputation  Clinical Impression  Pt very pleasant and reports normally being able to care for herself with use of AD. Pt with edema and weakness in all extremities currently limiting function with pt noted to have chorea type movement at rest of all extremities and head/neck also impairing function. Pt with decreased strength, balance, transfers, ROM and functional activity who will benefit from acute therapy to maximize mobility, safety and independence to decrease burden of care.      Recommendations for follow up therapy are one component of a multi-disciplinary discharge planning process, led by the attending physician.  Recommendations may be updated based on patient status, additional functional criteria and insurance authorization.  Follow Up Recommendations Acute inpatient rehab (3hours/day)      Assistance Recommended at Discharge Frequent or constant Supervision/Assistance  Patient can return home with the following  Two people to help with walking and/or transfers;Two people to help with bathing/dressing/bathroom;Direct supervision/assist for medications management;Help with stairs or ramp for entrance;Assistance with cooking/housework;Direct supervision/assist for financial management;Assist for transportation;Assistance with feeding    Equipment Recommendations Other (comment) (TBD with progression)  Recommendations for Other Services       Functional Status Assessment Patient has had a recent decline in their functional status and demonstrates the ability to make significant improvements in function in a reasonable and predictable amount of time.     Precautions / Restrictions  Precautions Precautions: Fall Precaution Comments: choretic movement of all extremities and head Restrictions Weight Bearing Restrictions: No      Mobility  Bed Mobility   Bed Mobility: Supine to Sit, Sit to Supine           General bed mobility comments: foot egress to full chair position with mod assist to achieve anterior translation off surface with bil UE use on rails to pull forward. Return to supine pt able to perform limited posterior scooting, bed positioning into supine and mod assist in trendelenburg to scoot to [facility]    Transfers Overall transfer level: Needs assistance   Transfers: Sit to/from Stand Sit to Stand: Mod assist           General transfer comment: mod assist from foot egress with bil UE on rails, left knee blocked and use of pad to stand. able to clear sacrum but not achieve full upright x 2 trials    Ambulation/Gait               General Gait Details: unable  Stairs            Wheelchair Mobility    Modified Rankin (Stroke Patients Only)       Balance Overall balance assessment: Needs assistance Sitting-balance support: Bilateral upper extremity supported, Feet supported Sitting balance-Leahy Scale: Poor Sitting balance - Comments: bil UE on rails with tendency for posterior lean     Standing balance-Leahy Scale: Poor Standing balance comment: bil UE support                             Pertinent Vitals/Pain Pain Assessment Pain Score: 4  Pain Location: knees and hands due to edema Pain Descriptors / Indicators: Aching, Sore, Tightness Pain Intervention(s): Limited activity within patient's tolerance, Monitored during  session, Repositioned    Home Living Family/patient expects to be discharged to:: Private residence Living Arrangements: Spouse/significant other Available Help at Discharge: Family;Available PRN/intermittently;Other (Comment) (husband works days) Type of Home: Mobile home Home Access:  Ramped entrance       Home Layout: One level Home Equipment: Grab bars - tub/shower;Rolling Environmental consultant (2 wheels);Rollator (4 wheels);BSC/3in1;Cane - quad Additional Comments: pt reports using RW in home, but will take cane for community distances.    Prior Function Prior Level of Function : Independent/Modified Independent                     Hand Dominance   Dominant Hand: Right    Extremity/Trunk Assessment   Upper Extremity Assessment Upper Extremity Assessment: Defer to OT evaluation RUE Deficits / Details: edema, can actively move shoulder and elbow ~1/3 range, no AROM noted in fingers, trace wrist RUE Coordination: decreased fine motor;decreased gross motor LUE Deficits / Details: edema, can actively move shoulder and elbow ~1/3 range, no AROM noted in fingers, trace wrist LUE Coordination: decreased gross motor;decreased fine motor    Lower Extremity Assessment Lower Extremity Assessment: RLE deficits/detail;LLE deficits/detail RLE Deficits / Details: grossly 2+/5 with limited ROM due to edema and pain, chorea type movement at rest when not cued for functional task, knee ROM limited to grossly 45 degrees LLE Deficits / Details: grossly 2+/5 with limited ROM due to edema and pain, chorea type movement at rest when not cued for functional task, knee ROM limited to grossly 45 degrees    Cervical / Trunk Assessment Cervical / Trunk Assessment: Kyphotic;Other exceptions Cervical / Trunk Exceptions: increased body habitus, chorea type movement of head and neck at times  Communication   Communication: No difficulties (speech is monotoned)  Cognition Arousal/Alertness: Awake/alert Behavior During Therapy: WFL for tasks assessed/performed Overall Cognitive Status: Impaired/Different from baseline Area of Impairment: Orientation, Problem solving                 Orientation Level: Disoriented to, Time     Following Commands: Follows one step commands with increased  time Safety/Judgement: Decreased awareness of deficits, Decreased awareness of safety   Problem Solving: Slow processing, Requires verbal cues, Difficulty sequencing, Requires tactile cues          General Comments      Exercises General Exercises - Lower Extremity Short Arc Quad: AROM, Both, 10 reps, Seated Hip ABduction/ADduction: AROM, Both, 10 reps, Seated Hip Flexion/Marching: AAROM, Both, 10 reps, Seated   Assessment/Plan    PT Assessment Patient needs continued PT services  PT Problem List Decreased strength;Decreased mobility;Decreased safety awareness;Decreased range of motion;Decreased activity tolerance;Decreased balance;Decreased knowledge of use of DME;Obesity       PT Treatment Interventions Gait training;Balance training;DME instruction;Therapeutic exercise;Functional mobility training;Therapeutic activities;Patient/family education;Cognitive remediation;Neuromuscular re-education    PT Goals (Current goals can be found in the Care Plan section)  Acute Rehab PT Goals Patient Stated Goal: return home, feed myself PT Goal Formulation: With patient Time For Goal Achievement: 02/01/22 Potential to Achieve Goals: Fair    Frequency Min 3X/week     Co-evaluation               AM-PAC PT "6 Clicks" Mobility  Outcome Measure Help needed turning from your back to your side while in a flat bed without using bedrails?: A Lot Help needed moving from lying on your back to sitting on the side of a flat bed without using bedrails?: A Lot Help needed moving to  and from a bed to a chair (including a wheelchair)?: Total Help needed standing up from a chair using your arms (e.g., wheelchair or bedside chair)?: Total Help needed to walk in hospital room?: Total Help needed climbing 3-5 steps with a railing? : Total 6 Click Score: 8    End of Session   Activity Tolerance: Patient tolerated treatment well Patient left: in bed;with call bell/phone within reach;with bed  alarm set Nurse Communication: Mobility status;Need for lift equipment PT Visit Diagnosis: Other abnormalities of gait and mobility (R26.89);Difficulty in walking, not elsewhere classified (R26.2);Muscle weakness (generalized) (M62.81)    Time: 1610-9604 PT Time Calculation (min) (ACUTE ONLY): 25 min   Charges:   PT Evaluation $PT Eval Moderate Complexity: 1 Mod PT Treatments $Therapeutic Activity: 8-22 mins        Merryl Hacker, PT Acute Rehabilitation Services Office: [phone]   Enedina Finner Tasmia Blumer 01/18/2022, 1:33 PM

## 2022-01-18 NOTE — Evaluation (Signed)
Occupational Therapy Evaluation Patient Details Name: Susan [name] MRN: [id number] DOB: 03/31/1951 Today's Date: 01/18/2022   History of Present Illness 71 yo female admitted 8/22 with DKA.8/24 Afib with RVR. PMHx: obesity, CAD, HFrEF, CKD, HTN, HLD, DM, RBBB, PAD, neuropathy, Rt transmet amputation   Clinical Impression   This 71 yo female admitted with above presents to acute OT with PLOF of being Mod I with all basic ADLs from rolling walker standpoint. Currently she is total A (+2 at times) for all basic ADLs and mobility due to body habitus, increased fluid and decreased AROM as well as cognitive deficits. Also at rest she has extraneous body movements  (ie: legs constantly moving, head/neck/arms also but more sporadic than legs). She will continue to benefit from acute OT with follow up OT on AIR. We will continue to follow.     Recommendations for follow up therapy are one component of a multi-disciplinary discharge planning process, led by the attending physician.  Recommendations may be updated based on patient status, additional functional criteria and insurance authorization.   Follow Up Recommendations  Acute inpatient rehab (3hours/day)    Assistance Recommended at Discharge Frequent or constant Supervision/Assistance  Patient can return home with the following Two people to help with walking and/or transfers;Two people to help with bathing/dressing/bathroom;Assistance with cooking/housework;Assistance with feeding;Help with stairs or ramp for entrance;Assist for transportation;Direct supervision/assist for financial management;Direct supervision/assist for medications management    Functional Status Assessment  Patient has had a recent decline in their functional status and demonstrates the ability to make significant improvements in function in a reasonable and predictable amount of time.  Equipment Recommendations  Other (comment) (TBD next venue)       Precautions /  Restrictions Precautions Precautions: Fall Precaution Comments: extraneous body movements even at rest (ie: legs and head more than arms) Restrictions Weight Bearing Restrictions: No      Mobility Bed Mobility Overal bed mobility: Needs Assistance Bed Mobility: Supine to Sit, Sit to Supine     Supine to sit: Total assist, +2 for physical assistance, HOB elevated Sit to supine: Total assist, +2 for physical assistance            Balance Overall balance assessment: Needs assistance Sitting-balance support: Bilateral upper extremity supported, Feet supported Sitting balance-Leahy Scale: Zero   Postural control: Posterior lean, Left lateral lean                                 ADL either performed or assessed with clinical judgement   ADL Overall ADL's : Needs assistance/impaired                                       General ADL Comments: total A at bed level     Vision Baseline Vision/History: 0 No visual deficits Patient Visual Report: No change from baseline              Pertinent Vitals/Pain Pain Assessment Pain Assessment: Faces Faces Pain Scale: Hurts even more Pain Location: hands (fingers) and legs when assisted to move (edema in all) Pain Descriptors / Indicators: Aching, Sore, Tightness Pain Intervention(s): Limited activity within patient's tolerance, Monitored during session, Repositioned     Hand Dominance Right   Extremity/Trunk Assessment Upper Extremity Assessment Upper Extremity Assessment: RUE deficits/detail;LUE deficits/detail RUE Deficits / Details: edema, can actively move  shoulder and elbow ~1/3 range, no AROM noted in fingers, trace wrist RUE Coordination: decreased fine motor;decreased gross motor LUE Deficits / Details: edema, can actively move shoulder and elbow ~1/3 range, no AROM noted in fingers, trace wrist LUE Coordination: decreased gross motor;decreased fine motor           Communication  Communication Communication: No difficulties (speech is monotoned)   Cognition Arousal/Alertness: Awake/alert Behavior During Therapy: Restless, Flat affect Overall Cognitive Status: Impaired/Different from baseline Area of Impairment: Orientation, Following commands, Safety/judgement, Awareness, Problem solving                 Orientation Level: Disoriented to, Time     Following Commands: Follows one step commands inconsistently, Follows one step commands with increased time Safety/Judgement: Decreased awareness of deficits, Decreased awareness of safety Awareness: Intellectual Problem Solving: Slow processing, Decreased initiation, Difficulty sequencing, Requires verbal cues, Requires tactile cues General Comments: did not know year and month was March                Home Living Family/patient expects to be discharged to:: Private residence Living Arrangements: Spouse/significant other Available Help at Discharge: Family;Available PRN/intermittently;Other (Comment) (husband works days) Type of Home: Mobile home Home Access: Ramped entrance     [place]: One level     Bathroom Shower/Tub: Tub/shower unit;Door   Bathroom Toilet: Handicapped height     Home Equipment: Grab bars - tub/shower;Rolling Environmental consultant (2 wheels);Rollator (4 wheels);BSC/3in1;Cane - quad   Additional Comments: pt reports using RW in home, but will take cane for community distances.      Prior Functioning/Environment Prior Level of Function : Independent/Modified Independent                        OT Problem List: Decreased strength;Decreased range of motion;Decreased activity tolerance;Impaired balance (sitting and/or standing);Decreased coordination;Decreased cognition;Decreased safety awareness;Impaired UE functional use;Obesity;Increased edema;Pain      OT Treatment/Interventions: Self-care/ADL training;DME and/or AE instruction;Patient/family education;Balance  training;Therapeutic exercise;Therapeutic activities    OT Goals(Current goals can be found in the care plan section) Acute Rehab OT Goals Patient Stated Goal: to get OOB and hands to work OT Goal Formulation: With patient Time For Goal Achievement: 02/01/22 Potential to Achieve Goals: Good  OT Frequency: Min 2X/week       AM-PAC OT "6 Clicks" Daily Activity     Outcome Measure Help from another person eating meals?: Total Help from another person taking care of personal grooming?: Total Help from another person toileting, which includes using toliet, bedpan, or urinal?: Total Help from another person bathing (including washing, rinsing, drying)?: Total Help from another person to put on and taking off regular upper body clothing?: Total Help from another person to put on and taking off regular lower body clothing?: Total 6 Click Score: 6   End of Session Nurse Communication: Mobility status (RN in room with me A'ing for up to sit EOB and to lay back down. sent her chat text asking her to check if I turned the bed alarm on.)  Activity Tolerance: Patient limited by pain Patient left: in bed;with call bell/phone within reach (in chair position)  OT Visit Diagnosis: Other abnormalities of gait and mobility (R26.89);Muscle weakness (generalized) (M62.81);Other symptoms and signs involving cognitive function;Pain Pain - Right/Left:  (boht) Pain - part of body:  (legs and hands)                Time: 1610-9604 OT Time Calculation (min): 36  min Charges:  OT General Charges $OT Visit: 1 Visit OT Evaluation $OT Eval Moderate Complexity: 1 Mod OT Treatments $Self Care/Home Management : 8-22 mins  Golden Circle, OTR/L [facility] Re[facility] [phone] Office [phone]    Almon Register 01/18/2022, 10:47 AM

## 2022-01-18 NOTE — Progress Notes (Signed)
Inpatient Rehab Admissions Coordinator Note:   Per therapy patient was screened for CIR candidacy by Tymon Nemetz Danford Bad, CCC-SLP. At this time, pt appears to be a potential candidate for CIR. I will place an order for rehab consult for full assessment, per our protocol.  Please contact me any with questions.Gayland Curry, [place], [place] Admissions Coordinator [phone] 01/18/22 2:13 PM

## 2022-01-18 NOTE — Progress Notes (Signed)
[facility]                                                                               Susan [name], is a 71 y.o. female, DOB - [birth year], PYP:[id number] Admit date - 01/14/2022    Outpatient Primary MD for the patient is [name], [name], MD  LOS - 4  days    Brief summary    71 year old female with HFrEF, cardiac disease and CKD stage IV who presented with abdominal pain, nausea and vomiting, noted to be in severe DKA, admitted to ICU under PCCM service, . She was started on IV insulin infusion and anion gap closed. Hospital course complicated by atrial fibrillation, followed by non sustained V tach and elevated troponins. Cardiology consulted and echocardiogram showed LVEF of 30 to 35% unchanged.     Assessment & Plan    Assessment and Plan:   Atrial Fibrillation:  Rate better today its between 90's to 110/min.  Continue with amiodarone 400 mg BID and coreg 6.25 mg BID.  Continue with aspirin 81 mg daily.      DKA  Resolved. GAP closed.   Type 2 DM, insulin dependent:  CBG (last 3)  Recent Labs    01/18/22 1121 01/18/22 1521 01/18/22 1639  GLUCAP 145* 165* 146*     Continue with Levemir 15 BID. Continue with SSI.  Last A1c is 12.2. on 10/22.  Repeat Hemoglobin A1c ordered.    CAD s/p RCA Elevated troponins.  Continue with aspirin and prn BB.    Chronic systolic heart failure: Last echo showed LVEF of 30 to 35%.  Continue with torsemide 80 mg BID.     ESBL UTI;  Completed Meropenem.    Hypothyroidism:  Resume synthroid 50 mcg daily.  Get TSH in am.    Therapy evaluation recommending ACUTE REHAB.   RN Pressure Injury Documentation: Pressure Injury 05/17/20 Buttocks Right;Left Stage 3 -  Full thickness tissue loss. Subcutaneous fat may be visible but bone, tendon or muscle are NOT exposed. Borders pink, wound bed yellow. Left buttocks wound 4cmLength, 1.5cm Width. Right buttocks wou (Active)  05/17/20 1702  Location:  Buttocks  Location Orientation: Right;Left  Staging: Stage 3 -  Full thickness tissue loss. Subcutaneous fat may be visible but bone, tendon or muscle are NOT exposed.  Wound Description (Comments): Borders pink, wound bed yellow. Left buttocks wound 4cmLength, 1.5cm Width. Right buttocks wound 3-1/2 cm Length, 1-1/2cm Width  Present on Admission: Yes     Pressure Injury 08/17/20 Left Stage 3 -  Full thickness tissue loss. Subcutaneous fat may be visible but bone, tendon or muscle are NOT exposed. 2 areas of healing stage 3 pressure injuries to left buttock (Active)  08/17/20   Location:   Location Orientation: Left  Staging: Stage 3 -  Full thickness tissue loss. Subcutaneous fat may be visible but bone, tendon or muscle are NOT exposed.  Wound Description (Comments): 2 areas of healing stage 3 pressure injuries to left buttock  Present on Admission: Yes     Pressure Injury 08/17/20 Buttocks Right Stage 3 -  Full thickness tissue loss. Subcutaneous fat may be visible but bone, tendon or muscle  are NOT exposed. (Active)  08/17/20   Location: Buttocks  Location Orientation: Right  Staging: Stage 3 -  Full thickness tissue loss. Subcutaneous fat may be visible but bone, tendon or muscle are NOT exposed.  Wound Description (Comments):   Present on Admission: Yes     Pressure Injury 01/16/22 Buttocks Right Deep Tissue Pressure Injury - Purple or maroon localized area of discolored intact skin or blood-filled blister due to damage of underlying soft tissue from pressure and/or shear. Purple (Active)  01/16/22 2000  Location: Buttocks  Location Orientation: Right  Staging: Deep Tissue Pressure Injury - Purple or maroon localized area of discolored intact skin or blood-filled blister due to damage of underlying soft tissue from pressure and/or shear.  Wound Description (Comments): Purple  Present on Admission:   Dressing Type Foam - Lift dressing to assess site every shift 01/18/22 1509      Pressure Injury 01/16/22 Buttocks Left Deep Tissue Pressure Injury - Purple or maroon localized area of discolored intact skin or blood-filled blister due to damage of underlying soft tissue from pressure and/or shear. purple (Active)  01/16/22 2000  Location: Buttocks  Location Orientation: Left  Staging: Deep Tissue Pressure Injury - Purple or maroon localized area of discolored intact skin or blood-filled blister due to damage of underlying soft tissue from pressure and/or shear.  Wound Description (Comments): purple  Present on Admission:   Dressing Type Foam - Lift dressing to assess site every shift 01/18/22 1509   Local wound care.    Body mass index is 39.5 kg/m. Obesity;   Estimated body mass index is 39.5 kg/m as calculated from the following:   Height as of this encounter: '5\' 6"'$  (1.676 m).   Weight as of this encounter: 111 kg.  Code Status: full code.  DVT Prophylaxis:  heparin.    Level of Care: Level of care: Telemetry Medical Family Communication: none at bedside.   Disposition Plan:     Remains inpatient appropriate:  atrial fibrillation.   Procedures:  Echocardiogram.   Consultants:   Cardiology PCCM.   Antimicrobials:   Anti-infectives (From admission, onward)    Start     Dose/Rate Route Frequency Ordered Stop   01/16/22 0000  meropenem (MERREM) 1 g in sodium chloride 0.9 % 100 mL IVPB  Status:  Discontinued        1 g 200 mL/hr over 30 Minutes Intravenous Every 24 hours 01/15/22 1035 01/15/22 1054   01/15/22 1200  meropenem (MERREM) 1 g in sodium chloride 0.9 % 100 mL IVPB        1 g 200 mL/hr over 30 Minutes Intravenous Every 12 hours 01/15/22 1054 01/18/22 1509   01/14/22 2330  meropenem (MERREM) 1 g in sodium chloride 0.9 % 100 mL IVPB        1 g 200 mL/hr over 30 Minutes Intravenous  Once 01/14/22 2231 01/15/22 0038   01/14/22 1815  cefTRIAXone (ROCEPHIN) 1 g in sodium chloride 0.9 % 100 mL IVPB  Status:  Discontinued        1 g 200 mL/hr  over 30 Minutes Intravenous  Once 01/14/22 1805 01/14/22 2052        Medications  Scheduled Meds:  amiodarone  400 mg Oral BID   aspirin  81 mg Per NG tube Daily   carvedilol  6.25 mg Oral BID WC   Chlorhexidine Gluconate Cloth  6 each Topical Daily   insulin aspart  2-6 Units Subcutaneous Q4H   insulin detemir  15 Units Subcutaneous BID   [START ON 01/19/2022] levothyroxine  50 mcg Oral Q0600   mouth rinse  15 mL Mouth Rinse 4 times per day   torsemide  80 mg Oral BID   Continuous Infusions:  sodium chloride 10 mL/hr at 01/18/22 1200   heparin 2,050 Units/hr (01/18/22 1434)   PRN Meds:.sodium chloride, acetaminophen, dextrose, metoprolol tartrate, mouth rinse, oxyCODONE    Subjective:   Tannis Burstein was seen and examined today.    Objective:   Vitals:   01/18/22 1321 01/18/22 1509 01/18/22 1510 01/18/22 1531  BP:  114/65    Pulse: 98 98 96 99  Resp:  '17 14 16  '$ Temp:    98.5 F (36.9 C)  TempSrc:    Oral  SpO2: 92% 95% 95% 97%  Weight:      Height:        Intake/Output Summary (Last 24 hours) at 01/18/2022 1537 Last data filed at 01/18/2022 1509 Gross per 24 hour  Intake 795.8 ml  Output 1350 ml  Net -554.2 ml   Filed Weights   01/15/22 0047 01/16/22 0400  Weight: 108.2 kg 111 kg     Exam General exam: Elderly woman, ill appearing, in mild distress from palpitations Respiratory system: Clear to auscultation. Respiratory effort normal. Cardiovascular system: S1 & S2 heard, tachycardic, irregularly irregular, no JVD.  Gastrointestinal system: Abdomen is nondistended, soft and nontender.  Normal bowel sounds heard. Central nervous system: Alert and oriented to place and person only.  Extremities: no cyanosis.  Skin: pressure injury on the right buttock.  Psychiatry: Mood & affect appropriate.    Data Reviewed:  I have personally reviewed following labs and imaging studies   CBC Lab Results  Component Value Date   WBC 7.7 01/18/2022   RBC 3.73 (L)  01/18/2022   HGB 10.0 (L) 01/18/2022   HCT 29.4 (L) 01/18/2022   MCV 78.8 (L) 01/18/2022   MCH 26.8 01/18/2022   PLT 81 (L) 01/18/2022   MCHC 34.0 01/18/2022   RDW 17.8 (H) 01/18/2022   LYMPHSABS 0.6 (L) 01/14/2022   MONOABS 0.6 01/14/2022   EOSABS 0.0 01/14/2022   BASOSABS 0.0 28/78/6767     Last metabolic panel Lab Results  Component Value Date   NA 130 (L) 01/18/2022   K 4.0 01/18/2022   CL 97 (L) 01/18/2022   CO2 23 01/18/2022   BUN 86 (H) 01/18/2022   CREATININE 3.30 (H) 01/18/2022   GLUCOSE 208 (H) 01/18/2022   GFRNONAA 14 (L) 01/18/2022   GFRAA 13 (L) 11/15/2020   CALCIUM 8.2 (L) 01/18/2022   PHOS 4.0 01/17/2022   PROT 6.3 (L) 01/14/2022   ALBUMIN 3.4 (L) 01/14/2022   BILITOT 1.9 (H) 01/14/2022   ALKPHOS 103 01/14/2022   AST 28 01/14/2022   ALT 24 01/14/2022   ANIONGAP 10 01/18/2022    CBG (last 3)  Recent Labs    01/18/22 0801 01/18/22 1121 01/18/22 1521  GLUCAP 80 145* 165*      Coagulation Profile: Recent Labs  Lab 01/14/22 2245  INR 1.3*     Radiology Studies: ECHOCARDIOGRAM LIMITED  Result Date: 01/17/2022    ECHOCARDIOGRAM LIMITED REPORT   Patient Name:   ALEXAS BASULTO Date of Exam: 01/17/2022 Medical Rec #:  [id number]    Height:       66.0 in Accession #:    [id number]   Weight:       244.7 lb Date of Birth:  [birth year]    BSA:  2.179 m Patient Age:    89 years     BP:           121/77 mmHg Patient Gender: F            HR:           114 bpm. Exam Location:  Inpatient Procedure: Limited Echo Indications:    congestive heart failure  History:        Patient has prior history of Echocardiogram examinations, most                 recent 12/10/2021. Arrythmias:RBBB; Risk Factors:Diabetes,                 Hypertension and Dyslipidemia.  Sonographer:    Johny Chess RDCS Referring Phys: [id number] Kipp Brood  Sonographer Comments: Patient is obese. IMPRESSIONS  1. Left ventricular ejection fraction, by estimation, is 30 to 35%. The left  ventricle has moderately decreased function. The left ventricle demonstrates regional wall motion abnormalities (see scoring diagram/findings for description). Left ventricular  diastolic function could not be evaluated.  2. Right ventricular systolic function is moderately reduced. The right ventricular size is moderately enlarged. There is mildly elevated pulmonary artery systolic pressure.  3. The mitral valve is abnormal. Trivial mitral valve regurgitation. No evidence of mitral stenosis.  4. Tricuspid valve regurgitation is mild to moderate.  5. The aortic valve is normal in structure. Aortic valve regurgitation is not visualized. No aortic stenosis is present.  6. The inferior vena cava is normal in size with greater than 50% respiratory variability, suggesting right atrial pressure of 3 mmHg. FINDINGS  Left Ventricle: Left ventricular ejection fraction, by estimation, is 30 to 35%. The left ventricle has moderately decreased function. The left ventricle demonstrates regional wall motion abnormalities. The left ventricular internal cavity size was normal in size. There is no left ventricular hypertrophy. Left ventricular diastolic function could not be evaluated. Left ventricular diastolic function could not be evaluated due to nondiagnostic images.  LV Wall Scoring: The entire inferior wall and apex are akinetic. The entire anterior wall, entire lateral wall, and entire septum are hypokinetic. Right Ventricle: The right ventricular size is moderately enlarged. No increase in right ventricular wall thickness. Right ventricular systolic function is moderately reduced. There is mildly elevated pulmonary artery systolic pressure. The tricuspid regurgitant velocity is 3.16 m/s, and with an assumed right atrial pressure of 3 mmHg, the estimated right ventricular systolic pressure is 37.1 mmHg. Left Atrium: Left atrial size was normal in size. Right Atrium: Right atrial size was normal in size. Pericardium: There is  no evidence of pericardial effusion. Mitral Valve: The mitral valve is abnormal. Trivial mitral valve regurgitation. No evidence of mitral valve stenosis. Tricuspid Valve: The tricuspid valve is normal in structure. Tricuspid valve regurgitation is mild to moderate. No evidence of tricuspid stenosis. Aortic Valve: The aortic valve is normal in structure. Aortic valve regurgitation is not visualized. No aortic stenosis is present. Pulmonic Valve: The pulmonic valve was normal in structure. Pulmonic valve regurgitation is not visualized. No evidence of pulmonic stenosis. Aorta: The aortic root is normal in size and structure. Venous: The inferior vena cava is normal in size with greater than 50% respiratory variability, suggesting right atrial pressure of 3 mmHg. IAS/Shunts: No atrial level shunt detected by color flow Doppler. LEFT VENTRICLE PLAX 2D LVIDd:         5.40 cm LVIDs:         4.50 cm LV PW:  1.00 cm LV IVS:        0.80 cm  IVC IVC diam: 1.70 cm LEFT ATRIUM         Index LA diam:    4.40 cm 2.02 cm/m   AORTA Ao Asc diam: 2.80 cm TRICUSPID VALVE TR Peak grad:   39.9 mmHg TR Vmax:        316.00 cm/s Skeet Latch MD Electronically signed by Skeet Latch MD Signature Date/Time: 01/17/2022/12:38:00 PM    Final        Hosie Poisson M.D. [facility] 01/18/2022, 3:37 PM  Available via Epic secure chat 7am-7pm After 7 pm, please refer to night coverage provider listed on amion.

## 2022-01-18 NOTE — Progress Notes (Signed)
ANTICOAGULATION CONSULT NOTE   Pharmacy Consult for heparin infusion Indication: chest pain/ACS, atrial fibrillation   Allergies  Allergen Reactions   Keflex [Cephalexin] Diarrhea   Codeine Nausea And Vomiting and Other (See Comments)    Patient Measurements: Height: '5\' 6"'$  (167.6 cm) Weight: 111 kg (244 lb 11.4 oz) IBW/kg (Calculated) : 59.3 Heparin Dosing Weight: 84.3  Vital Signs: Temp: 98.2 F (36.8 C) (08/26 2040) Temp Source: Oral (08/26 2040) BP: 119/72 (08/26 2040) Pulse Rate: 91 (08/26 2040)  Labs: Recent Labs    01/16/22 0152 01/16/22 0552 01/16/22 1345 01/16/22 1559 01/17/22 0108 01/17/22 1508 01/18/22 0109 01/18/22 1334 01/18/22 2234  HGB 11.9*  --   --   --  10.5*  --  10.0*  --   --   HCT 31.8*  --   --   --  30.5*  --  29.4*  --   --   PLT 119*  --   --   --  86*  --  81*  --   --   HEPARINUNFRC  --   --   --   --  <0.10*   < > <0.10* 0.20* 0.20*  CREATININE 4.27*   < > 3.66* 3.92* 3.68*  --   --  3.30*  --   CKTOTAL  --   --  114  --   --   --   --   --   --   TROPONINIHS  --   --  837* 898*  --   --   --   --   --    < > = values in this interval not displayed.     Estimated Creatinine Clearance: 19.5 mL/min (A) (by C-G formula based on SCr of 3.3 mg/dL (H)).   Medical History: Past Medical History:  Diagnosis Date   Acute MI ([facility]) 1999; 2007   Anemia    hx   Anginal pain ([facility])    Anxiety    ARF (acute renal failure) ([facility]) 06/2017   Howe Kidney Asso   Arthritis    "generalized" (03/15/2014)   CAD (coronary artery disease)    MI in 2000 - MI  2007 - treated bare metal stent (no nuclear since then as 9/11)   Carotid artery disease ([facility])    CHF (congestive heart failure) ([facility])    Chronic diastolic heart failure ([facility])    a) ECHO (08/2013) EF 55-60% and RV function nl b) RHC (08/2013) RA 4, RV 30/5/7, PA 25/10 (16), PCWP 7, Fick CO/CI 6.3/2.7, PVR 1.5 WU, PA 61 and 66%   Daily headache    "~ every other day; since I fell in June"  (03/15/2014)   Depression    Dyslipidemia    Dyspnea    Exertional shortness of breath    HTN (hypertension)    Hypothyroidism    Neuropathy    Obesity    Osteoarthritis    Peripheral neuropathy    PONV (postoperative nausea and vomiting)    RBBB (right bundle branch block)    Old   Stroke ([facility] )    mini strokes   Syncope    likely due to low blood sugar   Tachycardia    Sinus tachycardia   Type II diabetes mellitus ([facility])    Type II   Urinary incontinence    Venous insufficiency     Assessment: Susan [name] is a 71 year old female admitted for DKA. Pharmacy has been consulted for a heparin infusion for chest pain/ACS.  Also with some afib per cardiology note.   8/26 PM update:  Heparin level low    Goal of Therapy:  Heparin level 0.3-0.7 units/ml Monitor platelets by anticoagulation protocol: Yes   Plan:  Inc heparin to 2200 units/hr 8 hour heparin level Watch platelet trend closely   Narda Bonds, PharmD, BCPS Clinical Pharmacist Phone: [phone]

## 2022-01-19 DIAGNOSIS — I5022 Chronic systolic (congestive) heart failure: Secondary | ICD-10-CM | POA: Diagnosis not present

## 2022-01-19 DIAGNOSIS — I509 Heart failure, unspecified: Secondary | ICD-10-CM | POA: Diagnosis not present

## 2022-01-19 DIAGNOSIS — E1110 Type 2 diabetes mellitus with ketoacidosis without coma: Secondary | ICD-10-CM | POA: Diagnosis not present

## 2022-01-19 DIAGNOSIS — E1310 Other specified diabetes mellitus with ketoacidosis without coma: Secondary | ICD-10-CM | POA: Diagnosis not present

## 2022-01-19 LAB — CBC
HCT: 29.5 % — ABNORMAL LOW (ref 36.0–46.0)
Hemoglobin: 10.1 g/dL — ABNORMAL LOW (ref 12.0–15.0)
MCH: 27.4 pg (ref 26.0–34.0)
MCHC: 34.2 g/dL (ref 30.0–36.0)
MCV: 80.2 fL (ref 80.0–100.0)
Platelets: 76 10*3/uL — ABNORMAL LOW (ref 150–400)
RBC: 3.68 MIL/uL — ABNORMAL LOW (ref 3.87–5.11)
RDW: 17.7 % — ABNORMAL HIGH (ref 11.5–15.5)
WBC: 6.8 10*3/uL (ref 4.0–10.5)
nRBC: 0 % (ref 0.0–0.2)

## 2022-01-19 LAB — BASIC METABOLIC PANEL
Anion gap: 10 (ref 5–15)
BUN: 88 mg/dL — ABNORMAL HIGH (ref 8–23)
CO2: 25 mmol/L (ref 22–32)
Calcium: 8.5 mg/dL — ABNORMAL LOW (ref 8.9–10.3)
Chloride: 96 mmol/L — ABNORMAL LOW (ref 98–111)
Creatinine, Ser: 3.29 mg/dL — ABNORMAL HIGH (ref 0.44–1.00)
GFR, Estimated: 14 mL/min — ABNORMAL LOW (ref >60)
Glucose, Bld: 128 mg/dL — ABNORMAL HIGH (ref 70–99)
Potassium: 3.8 mmol/L (ref 3.5–5.1)
Sodium: 131 mmol/L — ABNORMAL LOW (ref 135–145)

## 2022-01-19 LAB — GLUCOSE, CAPILLARY
Glucose-Capillary: 133 mg/dL — ABNORMAL HIGH (ref 70–99)
Glucose-Capillary: 125 mg/dL — ABNORMAL HIGH (ref 70–99)
Glucose-Capillary: 132 mg/dL — ABNORMAL HIGH (ref 70–99)
Glucose-Capillary: 155 mg/dL — ABNORMAL HIGH (ref 70–99)
Glucose-Capillary: 107 mg/dL — ABNORMAL HIGH (ref 70–99)
Glucose-Capillary: 156 mg/dL — ABNORMAL HIGH (ref 70–99)

## 2022-01-19 LAB — HEPARIN LEVEL (UNFRACTIONATED): Heparin Unfractionated: 0.34 IU/mL (ref 0.30–0.70)

## 2022-01-19 LAB — TSH: TSH: 3.443 u[IU]/mL (ref 0.350–4.500)

## 2022-01-19 MED ORDER — APIXABAN 5 MG PO TABS
5.0000 mg | ORAL_TABLET | Freq: Two times a day (BID) | ORAL | Status: DC
  Administered 2022-01-19 – 2022-01-23 (×9): 5 mg via ORAL
  Filled 2022-01-19 (×9): qty 1

## 2022-01-19 NOTE — Progress Notes (Signed)
ANTICOAGULATION CONSULT NOTE - Follow Up Consult  Pharmacy Consult for transition from heparin to apixaban Indication: atrial fibrillation  Allergies  Allergen Reactions   Keflex [Cephalexin] Diarrhea   Codeine Nausea And Vomiting and Other (See Comments)    Patient Measurements: Height: '5\' 6"'$  (167.6 cm) Weight: 111 kg (244 lb 11.4 oz) IBW/kg (Calculated) : 59.3 kg Heparin Dosing Weight: 84.3 kg  Vital Signs: Temp: 98.9 F (37.2 C) (08/27 0335) Temp Source: Oral (08/27 0335) BP: 114/61 (08/27 0700) Pulse Rate: 65 (08/27 0700)  Labs: Recent Labs    01/16/22 1345 01/16/22 1559 01/16/22 1559 01/17/22 0108 01/17/22 1508 01/18/22 0109 01/18/22 1334 01/18/22 2234 01/19/22 0650  HGB  --   --    < > 10.5*  --  10.0*  --   --  10.1*  HCT  --   --   --  30.5*  --  29.4*  --   --  29.5*  PLT  --   --   --  86*  --  81*  --   --  76*  HEPARINUNFRC  --   --   --  <0.10*   < > <0.10* 0.20* 0.20* 0.34  CREATININE 3.66* 3.92*  --  3.68*  --   --  3.30*  --  3.29*  CKTOTAL 114  --   --   --   --   --   --   --   --   TROPONINIHS 837* 898*  --   --   --   --   --   --   --    < > = values in this interval not displayed.    Estimated Creatinine Clearance: 19.5 mL/min (A) (by C-G formula based on SCr of 3.29 mg/dL (H)).   Medications:  Infusions:   sodium chloride 10 mL/hr at 01/18/22 1200   heparin 2,200 Units/hr (01/19/22 0433)    Assessment: Susan [name] is a 71 year old female admitted for DKA. Pharmacy has been consulted for transition from heparin infusion to Eliquis for atrial fibrillation. Heparin level today is therapeutic at 0.34, on 2200 units/hr. Hgb 10.1 and stable, plt down to 76 from 119 on admission and slowly decreasing. No line issues or signs/symptoms of bleeding noted. Will start Eliquis when heparin is stopped.  Goal of Therapy:   Monitor platelets by anticoagulation protocol: Yes   Plan:  Start apixaban 5 mg po BID Discontinue heparin when apixaban dose is  given Monitor H&H and for signs/symptoms of bleeding.   Jeneen Rinks, Pharm.D PGY1 Pharmacy Resident 01/19/2022 10:10 AM

## 2022-01-19 NOTE — Progress Notes (Signed)
Inpatient Rehab Admissions:  Inpatient Rehab Consult received.  I met with patient, husband Percell Miller at the bedside for rehabilitation assessment and to discuss goals and expectations of an inpatient rehab admission.  Discussed average length of stay; insurance authorization requirement for her insurance; visitation policy. Both acknowledged understanding. Pt interested in pursuing CIR and husband supportive. Husband confirmed that he and son will be able to provide 24/7 support for pt after discharge. Will continue to follow.  Signed: Gayland Curry, [place], [place] Admissions Coordinator [phone]

## 2022-01-19 NOTE — Progress Notes (Signed)
[facility]                                                                               Susan [name], is a 71 y.o. female, DOB - 1950-10-14, PJA:[id number] Admit date - 01/14/2022    Outpatient Primary MD for the patient is [name], [name], MD  LOS - 5  days    Brief summary    71 year old female with HFrEF, cardiac disease and CKD stage IV who presented with abdominal pain, nausea and vomiting, noted to be in severe DKA, admitted to ICU under PCCM service, . She was started on IV insulin infusion and anion gap closed. Hospital course complicated by atrial fibrillation, followed by non sustained V tach and elevated troponins. Cardiology consulted and echocardiogram showed LVEF of 30 to 35% unchanged.     Assessment & Plan    Assessment and Plan:   Atrial Fibrillation:  Rate better today its between 90's to 110/min.  Continue with amiodarone 400 mg BID and coreg 6.25 mg BID.  Continue with aspirin 81 mg daily.      DKA  Resolved. GAP closed.   Type 2 DM, insulin dependent:  CBG (last 3)  Recent Labs    01/19/22 0341 01/19/22 0806 01/19/22 1213  GLUCAP 133* 125* 132*   CBG's ARE BETTER CONTROLLED.    Continue with Levemir 15 BID. Continue with SSI.  Last A1c is 12.2. on 10/22.  Repeat Hemoglobin A1c ordered an dpending.    CAD s/p RCA Elevated troponins.  Continue with aspirin and prn BB.    Chronic systolic heart failure: She appears compensated.  Last echo showed LVEF of 30 to 35%.  Continue with torsemide 80 mg BID.     ESBL UTI;  Completed Meropenem.    Hypothyroidism:  Resume synthroid 50 mcg daily.  TSH wnl.   Hyponatremia:  From CKD 4.    AKI superimposed on stage 4 CKD.  Suspect from dehydration and pre renal azotemia.  Baseline creatinine is around 3. She was admitted with a creatinine of 5.09.  Improved to 3.29 today.  Continue to monitor.  Recommend outpatient follow up with nephrology on discharge.   Therapy  evaluation recommending ACUTE REHAB.   RN Pressure Injury Documentation: Pressure Injury 05/17/20 Buttocks Right;Left Stage 3 -  Full thickness tissue loss. Subcutaneous fat may be visible but bone, tendon or muscle are NOT exposed. Borders pink, wound bed yellow. Left buttocks wound 4cmLength, 1.5cm Width. Right buttocks wou (Active)  05/17/20 1702  Location: Buttocks  Location Orientation: Right;Left  Staging: Stage 3 -  Full thickness tissue loss. Subcutaneous fat may be visible but bone, tendon or muscle are NOT exposed.  Wound Description (Comments): Borders pink, wound bed yellow. Left buttocks wound 4cmLength, 1.5cm Width. Right buttocks wound 3-1/2 cm Length, 1-1/2cm Width  Present on Admission: Yes     Pressure Injury 08/17/20 Left Stage 3 -  Full thickness tissue loss. Subcutaneous fat may be visible but bone, tendon or muscle are NOT exposed. 2 areas of healing stage 3 pressure injuries to left buttock (Active)  08/17/20   Location:   Location Orientation: Left  Staging: Stage 3 -  Full thickness tissue loss. Subcutaneous fat may be visible but bone, tendon or muscle are NOT exposed.  Wound Description (Comments): 2 areas of healing stage 3 pressure injuries to left buttock  Present on Admission: Yes     Pressure Injury 08/17/20 Buttocks Right Stage 3 -  Full thickness tissue loss. Subcutaneous fat may be visible but bone, tendon or muscle are NOT exposed. (Active)  08/17/20   Location: Buttocks  Location Orientation: Right  Staging: Stage 3 -  Full thickness tissue loss. Subcutaneous fat may be visible but bone, tendon or muscle are NOT exposed.  Wound Description (Comments):   Present on Admission: Yes     Pressure Injury 01/16/22 Buttocks Right Deep Tissue Pressure Injury - Purple or maroon localized area of discolored intact skin or blood-filled blister due to damage of underlying soft tissue from pressure and/or shear. Purple (Active)  01/16/22 2000  Location: Buttocks   Location Orientation: Right  Staging: Deep Tissue Pressure Injury - Purple or maroon localized area of discolored intact skin or blood-filled blister due to damage of underlying soft tissue from pressure and/or shear.  Wound Description (Comments): Purple  Present on Admission:   Dressing Type Foam - Lift dressing to assess site every shift 01/19/22 0850     Pressure Injury 01/16/22 Buttocks Left Deep Tissue Pressure Injury - Purple or maroon localized area of discolored intact skin or blood-filled blister due to damage of underlying soft tissue from pressure and/or shear. purple (Active)  01/16/22 2000  Location: Buttocks  Location Orientation: Left  Staging: Deep Tissue Pressure Injury - Purple or maroon localized area of discolored intact skin or blood-filled blister due to damage of underlying soft tissue from pressure and/or shear.  Wound Description (Comments): purple  Present on Admission:   Dressing Type Foam - Lift dressing to assess site every shift 01/19/22 0850   Local wound care.    Body mass index is 39.5 kg/m. Obesity;   Estimated body mass index is 39.5 kg/m as calculated from the following:   Height as of this encounter: '5\' 6"'$  (1.676 m).   Weight as of this encounter: 111 kg.  Code Status: full code.  DVT Prophylaxis:  heparin.  apixaban (ELIQUIS) tablet 5 mg   Level of Care: Level of care: Telemetry Medical Family Communication: none at bedside.   Disposition Plan:     Remains inpatient appropriate:  atrial fibrillation.   Procedures:  Echocardiogram.   Consultants:   Cardiology PCCM.   Antimicrobials:   Anti-infectives (From admission, onward)    Start     Dose/Rate Route Frequency Ordered Stop   01/16/22 0000  meropenem (MERREM) 1 g in sodium chloride 0.9 % 100 mL IVPB  Status:  Discontinued        1 g 200 mL/hr over 30 Minutes Intravenous Every 24 hours 01/15/22 1035 01/15/22 1054   01/15/22 1200  meropenem (MERREM) 1 g in sodium chloride 0.9 %  100 mL IVPB        1 g 200 mL/hr over 30 Minutes Intravenous Every 12 hours 01/15/22 1054 01/18/22 1509   01/14/22 2330  meropenem (MERREM) 1 g in sodium chloride 0.9 % 100 mL IVPB        1 g 200 mL/hr over 30 Minutes Intravenous  Once 01/14/22 2231 01/15/22 0038   01/14/22 1815  cefTRIAXone (ROCEPHIN) 1 g in sodium chloride 0.9 % 100 mL IVPB  Status:  Discontinued        1 g 200 mL/hr over  30 Minutes Intravenous  Once 01/14/22 1805 01/14/22 2052        Medications  Scheduled Meds:  amiodarone  400 mg Oral BID   apixaban  5 mg Oral BID   aspirin  81 mg Per NG tube Daily   carvedilol  6.25 mg Oral BID WC   Chlorhexidine Gluconate Cloth  6 each Topical Daily   insulin aspart  2-6 Units Subcutaneous Q4H   insulin detemir  15 Units Subcutaneous BID   levothyroxine  50 mcg Oral Q0600   mouth rinse  15 mL Mouth Rinse 4 times per day   torsemide  80 mg Oral BID   Continuous Infusions:  sodium chloride Stopped (01/19/22 1056)   PRN Meds:.sodium chloride, acetaminophen, dextrose, metoprolol tartrate, mouth rinse, oxyCODONE    Subjective:   Susan [name] was seen and examined today.  No new complaints today. She is weaned of oxygen.    Objective:   Vitals:   01/19/22 1300 01/19/22 1317 01/19/22 1400 01/19/22 1500  BP:      Pulse: 62 61 62 62  Resp: '17 13 15 14  '$ Temp:      TempSrc:      SpO2: 93% 96% 94% 96%  Weight:      Height:        Intake/Output Summary (Last 24 hours) at 01/19/2022 1546 Last data filed at 01/19/2022 1500 Gross per 24 hour  Intake 588.52 ml  Output 500 ml  Net 88.52 ml    Filed Weights   01/15/22 0047 01/16/22 0400  Weight: 108.2 kg 111 kg     Exam General exam: Appears calm and comfortable  Respiratory system: Clear to auscultation. Respiratory effort normal. Cardiovascular system: S1 & S2 heard, rate controlled, no JVD.  Gastrointestinal system: Abdomen is nondistended, soft and nontender. Normal bowel sounds heard. Central nervous  system: Alert and oriented. No focal neurological deficits. Extremities: Symmetric 5 x 5 power. Skin: pressure in jury in the back.  Psychiatry: Mood & affect appropriate.     Data Reviewed:  I have personally reviewed following labs and imaging studies   CBC Lab Results  Component Value Date   WBC 6.8 01/19/2022   RBC 3.68 (L) 01/19/2022   HGB 10.1 (L) 01/19/2022   HCT 29.5 (L) 01/19/2022   MCV 80.2 01/19/2022   MCH 27.4 01/19/2022   PLT 76 (L) 01/19/2022   MCHC 34.2 01/19/2022   RDW 17.7 (H) 01/19/2022   LYMPHSABS 0.6 (L) 01/14/2022   MONOABS 0.6 01/14/2022   EOSABS 0.0 01/14/2022   BASOSABS 0.0 58/01/9832     Last metabolic panel Lab Results  Component Value Date   NA 131 (L) 01/19/2022   K 3.8 01/19/2022   CL 96 (L) 01/19/2022   CO2 25 01/19/2022   BUN 88 (H) 01/19/2022   CREATININE 3.29 (H) 01/19/2022   GLUCOSE 128 (H) 01/19/2022   GFRNONAA 14 (L) 01/19/2022   GFRAA 13 (L) 11/15/2020   CALCIUM 8.5 (L) 01/19/2022   PHOS 4.0 01/17/2022   PROT 6.3 (L) 01/14/2022   ALBUMIN 3.4 (L) 01/14/2022   BILITOT 1.9 (H) 01/14/2022   ALKPHOS 103 01/14/2022   AST 28 01/14/2022   ALT 24 01/14/2022   ANIONGAP 10 01/19/2022    CBG (last 3)  Recent Labs    01/19/22 0341 01/19/22 0806 01/19/22 1213  GLUCAP 133* 125* 132*       Coagulation Profile: Recent Labs  Lab 01/14/22 2245  INR 1.3*      Radiology  Studies: No results found.     Hosie Poisson M.D. [facility] 01/19/2022, 3:46 PM  Available via Epic secure chat 7am-7pm After 7 pm, please refer to night coverage provider listed on amion.

## 2022-01-20 ENCOUNTER — Other Ambulatory Visit (HOSPITAL_COMMUNITY): Payer: Self-pay

## 2022-01-20 ENCOUNTER — Telehealth (HOSPITAL_COMMUNITY): Payer: Self-pay | Admitting: Pharmacy Technician

## 2022-01-20 DIAGNOSIS — E1310 Other specified diabetes mellitus with ketoacidosis without coma: Secondary | ICD-10-CM | POA: Diagnosis not present

## 2022-01-20 DIAGNOSIS — E1110 Type 2 diabetes mellitus with ketoacidosis without coma: Secondary | ICD-10-CM | POA: Diagnosis not present

## 2022-01-20 DIAGNOSIS — I5022 Chronic systolic (congestive) heart failure: Secondary | ICD-10-CM | POA: Diagnosis not present

## 2022-01-20 DIAGNOSIS — I509 Heart failure, unspecified: Secondary | ICD-10-CM | POA: Diagnosis not present

## 2022-01-20 LAB — CULTURE, BLOOD (ROUTINE X 2)
Culture: NO GROWTH
Culture: NO GROWTH
Special Requests: ADEQUATE

## 2022-01-20 LAB — CBC
HCT: 31.7 % — ABNORMAL LOW (ref 36.0–46.0)
Hemoglobin: 10.3 g/dL — ABNORMAL LOW (ref 12.0–15.0)
MCH: 27 pg (ref 26.0–34.0)
MCHC: 32.5 g/dL (ref 30.0–36.0)
MCV: 83 fL (ref 80.0–100.0)
Platelets: 112 10*3/uL — ABNORMAL LOW (ref 150–400)
RBC: 3.82 MIL/uL — ABNORMAL LOW (ref 3.87–5.11)
RDW: 17.9 % — ABNORMAL HIGH (ref 11.5–15.5)
WBC: 6.9 10*3/uL (ref 4.0–10.5)
nRBC: 0.4 % — ABNORMAL HIGH (ref 0.0–0.2)

## 2022-01-20 LAB — GLUCOSE, CAPILLARY
Glucose-Capillary: 87 mg/dL (ref 70–99)
Glucose-Capillary: 105 mg/dL — ABNORMAL HIGH (ref 70–99)
Glucose-Capillary: 109 mg/dL — ABNORMAL HIGH (ref 70–99)
Glucose-Capillary: 128 mg/dL — ABNORMAL HIGH (ref 70–99)
Glucose-Capillary: 135 mg/dL — ABNORMAL HIGH (ref 70–99)

## 2022-01-20 LAB — BASIC METABOLIC PANEL
Anion gap: 8 (ref 5–15)
BUN: 90 mg/dL — ABNORMAL HIGH (ref 8–23)
CO2: 25 mmol/L (ref 22–32)
Calcium: 8.3 mg/dL — ABNORMAL LOW (ref 8.9–10.3)
Chloride: 97 mmol/L — ABNORMAL LOW (ref 98–111)
Creatinine, Ser: 3.38 mg/dL — ABNORMAL HIGH (ref 0.44–1.00)
GFR, Estimated: 14 mL/min — ABNORMAL LOW (ref >60)
Glucose, Bld: 95 mg/dL (ref 70–99)
Potassium: 3.7 mmol/L (ref 3.5–5.1)
Sodium: 130 mmol/L — ABNORMAL LOW (ref 135–145)

## 2022-01-20 MED ORDER — INSULIN ASPART 100 UNIT/ML IJ SOLN
2.0000 [IU] | Freq: Three times a day (TID) | INTRAMUSCULAR | Status: DC
  Administered 2022-01-21 (×2): 6 [IU] via SUBCUTANEOUS

## 2022-01-20 MED ORDER — LIVING WELL WITH DIABETES BOOK
Freq: Once | Status: AC
  Filled 2022-01-20: qty 1

## 2022-01-20 NOTE — Progress Notes (Signed)
Inpatient Rehab Admissions Coordinator:  ?Insurance authorization started. Will continue to follow. ? ? ?Deondray Ospina Graves Madden, MS, CCC-SLP ?Admissions Coordinator ?[phone] ? ?

## 2022-01-20 NOTE — Progress Notes (Addendum)
Rounding Note    Patient Name: Susan [name] Date of Encounter: 01/20/2022  [place] Cardiologist: Glori Bickers, MD   Subjective   She reports she still feels palpitations. She reports that she still feels volume up. She takes O2 at night at home without CPAP and was not on O2 last night, slept poorly.  Inpatient Medications    Scheduled Meds:  amiodarone  400 mg Oral BID   apixaban  5 mg Oral BID   aspirin  81 mg Per NG tube Daily   carvedilol  6.25 mg Oral BID WC   insulin aspart  2-6 Units Subcutaneous Q4H   insulin detemir  15 Units Subcutaneous BID   levothyroxine  50 mcg Oral Q0600   mouth rinse  15 mL Mouth Rinse 4 times per day   torsemide  80 mg Oral BID   Continuous Infusions:  sodium chloride Stopped (01/19/22 1056)   PRN Meds: sodium chloride, acetaminophen, dextrose, metoprolol tartrate, mouth rinse, oxyCODONE   Vital Signs    Vitals:   01/19/22 2003 01/19/22 2304 01/20/22 0300 01/20/22 0831  BP: 110/64 (!) 118/56 (!) 102/58 (!) 123/58  Pulse: 62 65 60 61  Resp: '19 19 17 12  '$ Temp: 98.4 F (36.9 C) 98.6 F (37 C) 98.6 F (37 C) 97.7 F (36.5 C)  TempSrc: Oral Oral Oral Oral  SpO2: 96% 94% 91% 94%  Weight:      Height:        Intake/Output Summary (Last 24 hours) at 01/20/2022 0857 Last data filed at 01/20/2022 0500 Gross per 24 hour  Intake 468.52 ml  Output 700 ml  Net -231.48 ml      01/16/2022    4:00 AM 01/15/2022   12:47 AM 12/20/2021    3:12 PM  Last 3 Weights  Weight (lbs) 244 lb 11.4 oz 238 lb 8.6 oz 239 lb  Weight (kg) 111 kg 108.2 kg 108.41 kg      Telemetry    Appears sinus rhythm with HR 60s - Personally Reviewed  ECG    pending - Personally Reviewed  Physical Exam   GEN: No acute distress.   Neck: No JVD Cardiac: RRR, no murmurs, rubs, or gallops.  Respiratory: respirations unlabored GI: Soft, nontender, non-distended  MS: mild B LE edema; No deformity. Neuro:  Nonfocal  Psych: Normal affect    Labs    High Sensitivity Troponin:   Recent Labs  Lab 01/14/22 1650 01/14/22 2245 01/16/22 1345 01/16/22 1559  TROPONINIHS 55* 82* 837* 898*     Chemistry Recent Labs  Lab 01/14/22 1650 01/14/22 1654 01/14/22 2245 01/15/22 0135 01/16/22 1345 01/16/22 1559 01/17/22 0108 01/18/22 1334 01/19/22 0650 01/20/22 0325  NA 112*   < > 114*   < > 122*   < > 130* 130* 131* 130*  K 6.0*   < > 4.0   < > >7.5*   < > 3.7 4.0 3.8 3.7  CL 67*   < > 73*   < > 99   < > 94* 97* 96* 97*  CO2 9*  --  14*   < > 21*   < > '24 23 25 25  '$ GLUCOSE >1,200*   < > >1,200*   < > 205*   < > 219* 208* 128* 95  BUN 96*   < > 96*   < > 87*   < > 90* 86* 88* 90*  CREATININE 5.09*   < > 5.29*   < >  3.66*   < > 3.68* 3.30* 3.29* 3.38*  CALCIUM 8.1*  --  7.8*   < > 7.6*   < > 8.3* 8.2* 8.5* 8.3*  MG  --   --  2.0  --  1.4*  --  2.4  --   --   --   PROT 6.3*  --   --   --   --   --   --   --   --   --   ALBUMIN 3.4*  --   --   --   --   --   --   --   --   --   AST 28  --   --   --   --   --   --   --   --   --   ALT 24  --   --   --   --   --   --   --   --   --   ALKPHOS 103  --   --   --   --   --   --   --   --   --   BILITOT 1.9*  --   --   --   --   --   --   --   --   --   GFRNONAA 8*  --  8*   < > 13*   < > 13* 14* 14* 14*  ANIONGAP 36*  --  27*   < > 2*   < > '12 10 10 8   '$ < > = values in this interval not displayed.    Lipids No results for input(s): "CHOL", "TRIG", "HDL", "LABVLDL", "LDLCALC", "CHOLHDL" in the last 168 hours.  Hematology Recent Labs  Lab 01/18/22 0109 01/19/22 0650 01/20/22 0325  WBC 7.7 6.8 6.9  RBC 3.73* 3.68* 3.82*  HGB 10.0* 10.1* 10.3*  HCT 29.4* 29.5* 31.7*  MCV 78.8* 80.2 83.0  MCH 26.8 27.4 27.0  MCHC 34.0 34.2 32.5  RDW 17.8* 17.7* 17.9*  PLT 81* 76* 112*   Thyroid  Recent Labs  Lab 01/19/22 0650  TSH 3.443    BNP Recent Labs  Lab 01/15/22 0132  BNP 1,929.8*    DDimer No results for input(s): "DDIMER" in the last 168 hours.   Radiology    No  results found.  Cardiac Studies   Limited echo 01/17/22:  1. Left ventricular ejection fraction, by estimation, is 30 to 35%. The  left ventricle has moderately decreased function. The left ventricle  demonstrates regional wall motion abnormalities (see scoring  diagram/findings for description). Left ventricular   diastolic function could not be evaluated.   2. Right ventricular systolic function is moderately reduced. The right  ventricular size is moderately enlarged. There is mildly elevated  pulmonary artery systolic pressure.   3. The mitral valve is abnormal. Trivial mitral valve regurgitation. No  evidence of mitral stenosis.   4. Tricuspid valve regurgitation is mild to moderate.   5. The aortic valve is normal in structure. Aortic valve regurgitation is  not visualized. No aortic stenosis is present.   6. The inferior vena cava is normal in size with greater than 50%  respiratory variability, suggesting right atrial pressure of 3 mmHg.   Patient Profile     71 y.o. female with a hx of morbid obesity, CAD with PCI to RCA, HFrEF, CKD stage IV, hypertension, hyperlipidemia, DM2, RBBB, TIA and PAD  s/p transmetatarsal amputation who is being seen 01/17/2022 for the evaluation of elevated troponin and VT at the request of Dr. Tacy Learn.  Pt reportedly did not take medications for three days prior to admission due to well water being broken. She presented with DKA.   Assessment & Plan    Atrial fibrillation +/1 atrial flutter vs SVT - on 400 mg amiodarone BID x 5 days then 200 mg daily + coreg 6.25 mg BID - telemetry appears sinus rhythm with ectopy - continue amiodarone taper    Need for chronic anticoagulation This patients CHA2DS2-VASc Score and unadjusted Ischemic Stroke Rate (% per year) is equal to 11.2 % stroke rate/year from a score of 7 (female, age, DM, CAD, CHF, 2TIA) - currently on eliquis, but copays reported in epic are over $100 per month - may need to discuss  coumadin   Wide complex rhythm - VT vs aberrancy - pt was asymptomatic, was in this rhythm for ~4 min - no recurrence on amiodarone   Elevated troponin [place], likely demand ischemia in the setting of DKA No ischemic workup   Chronic systolic heart failure LVEF 30-35% Diuresing on 80 mg torsemide BID - this is her home dose - GDMT PTA: torsemide, coreg, imdur - was taking metolazone every other week - net positive 7L, does not appear extremely volume up   CAD Hx of RCA PCI (BMS 2000, BMS 2007) No chest pain Continue BB, no ASA given eliquis Continue statin     AoCKD DKA ESBL UTI - per primary   For questions or updates, please contact [place] Please consult [email or web address] for contact info under        Signed, Ledora Bottcher, PA  01/20/2022, 8:57 AM     Attending Note:   The patient was seen and examined.  Agree with assessment and plan as noted above.  Changes made to the above note as needed.  Patient seen and independently examined with Doreene Adas, PA .   We discussed all aspects of the encounter. I agree with the assessment and plan as stated above.    Atrial fib:   cont. Amio loading , cont coreg.  Appears to be in sinus rhythm  2.  Wide complex rhythm.  May have been aberrancy.   Contin amiodarone   3.  Elevated troponin:   likdely demand ischemia from DKA.   Will not pursue ischemic work up at this point   4.  Chronic combined CHF:   I/O suggest she is 7.2 liters positive.  Difficult to assess her volume status given her size.  She does not appear to be that volume overloaded.   Cont current dose of lasix      I have spent a total of 40 minutes with patient reviewing hospital  notes , telemetry, EKGs, labs and examining patient as well as establishing an assessment and plan that was discussed with the patient.  > 50% of time was spent in direct patient care.    Thayer Headings, Brooke Bonito., MD, [facility] 01/20/2022, 10:15 AM [address],  [place] Pager [phone]

## 2022-01-20 NOTE — Care Management Important Message (Signed)
Important Message  Patient Details  Name: Junko Ohagan MRN: [id number] Date of Birth: [birth year]   Medicare Important Message Given:  Yes     Aseem Sessums 01/20/2022, 2:25 PM

## 2022-01-20 NOTE — Progress Notes (Signed)
[facility]                                                                               Susan [name], is a 71 y.o. female, DOB - May 11, 1951, CBJ:[id number] Admit date - 01/14/2022    Outpatient Primary MD for the patient is [name], MD  LOS - 6  days    Brief summary    71 year old female with HFrEF, cardiac disease and CKD stage IV who presented with abdominal pain, nausea and vomiting, noted to be in severe DKA, admitted to ICU under PCCM service, . She was started on IV insulin infusion and anion gap closed. Hospital course complicated by atrial fibrillation, followed by non sustained V tach and elevated troponins. Cardiology consulted and echocardiogram showed LVEF of 30 to 35% unchanged.     Assessment & Plan    Assessment and Plan:   Atrial Fibrillation:  Rate much better today.  Continue with amiodarone 400 mg BID for 5 days followed by amiodarone 200 mg daily . and coreg 6.25 mg BID.  Continue with aspirin 81 mg daily.  On eliquis for anti coagulation. Unfortunately her CO-PAY is high. Will discuss with her about switching to coumadin.      DKA  Resolved. GAP closed.   Type 2 DM, insulin dependent:  CBG (last 3)  Recent Labs    01/20/22 0340 01/20/22 0828 01/20/22 1224  GLUCAP 87 105* 109*   CBG's ARE BETTER CONTROLLED.    Continue with Levemir 15 BID. Continue with SSI.  Last A1c is 12.2. on 10/22.  Repeat Hemoglobin A1c ordered and pending.    CAD s/p RCA Elevated troponins.  Continue with aspirin and prn BB.    Chronic systolic heart failure: She appears compensated.  Last echo showed LVEF of 30 to 35%.  Continue with torsemide 80 mg BID.     ESBL UTI;  Completed Meropenem.    Hypothyroidism:  Resume synthroid 50 mcg daily.  TSH wnl.   Hyponatremia:  From CKD 4.    AKI superimposed on stage 4 CKD.  Suspect from dehydration and pre renal azotemia.  Baseline creatinine is around 3. She was admitted with a  creatinine of 5.09.  Improved to 3..3, which appears to be her baseline.  Continue to monitor.  Recommend outpatient follow up with nephrology on discharge.   Therapy evaluation recommending ACUTE REHAB.   RN Pressure Injury Documentation: Pressure Injury 05/17/20 Buttocks Right;Left Stage 3 -  Full thickness tissue loss. Subcutaneous fat may be visible but bone, tendon or muscle are NOT exposed. Borders pink, wound bed yellow. Left buttocks wound 4cmLength, 1.5cm Width. Right buttocks wou (Active)  05/17/20 1702  Location: Buttocks  Location Orientation: Right;Left  Staging: Stage 3 -  Full thickness tissue loss. Subcutaneous fat may be visible but bone, tendon or muscle are NOT exposed.  Wound Description (Comments): Borders pink, wound bed yellow. Left buttocks wound 4cmLength, 1.5cm Width. Right buttocks wound 3-1/2 cm Length, 1-1/2cm Width  Present on Admission: Yes     Pressure Injury 08/17/20 Left Stage 3 -  Full thickness tissue loss. Subcutaneous fat may be visible but bone, tendon or muscle are  NOT exposed. 2 areas of healing stage 3 pressure injuries to left buttock (Active)  08/17/20   Location:   Location Orientation: Left  Staging: Stage 3 -  Full thickness tissue loss. Subcutaneous fat may be visible but bone, tendon or muscle are NOT exposed.  Wound Description (Comments): 2 areas of healing stage 3 pressure injuries to left buttock  Present on Admission: Yes     Pressure Injury 08/17/20 Buttocks Right Stage 3 -  Full thickness tissue loss. Subcutaneous fat may be visible but bone, tendon or muscle are NOT exposed. (Active)  08/17/20   Location: Buttocks  Location Orientation: Right  Staging: Stage 3 -  Full thickness tissue loss. Subcutaneous fat may be visible but bone, tendon or muscle are NOT exposed.  Wound Description (Comments):   Present on Admission: Yes     Pressure Injury 01/16/22 Buttocks Right Deep Tissue Pressure Injury - Purple or maroon localized area  of discolored intact skin or blood-filled blister due to damage of underlying soft tissue from pressure and/or shear. Purple (Active)  01/16/22 2000  Location: Buttocks  Location Orientation: Right  Staging: Deep Tissue Pressure Injury - Purple or maroon localized area of discolored intact skin or blood-filled blister due to damage of underlying soft tissue from pressure and/or shear.  Wound Description (Comments): Purple  Present on Admission:   Dressing Type Foam - Lift dressing to assess site every shift 01/20/22 0831     Pressure Injury 01/16/22 Buttocks Left Deep Tissue Pressure Injury - Purple or maroon localized area of discolored intact skin or blood-filled blister due to damage of underlying soft tissue from pressure and/or shear. purple (Active)  01/16/22 2000  Location: Buttocks  Location Orientation: Left  Staging: Deep Tissue Pressure Injury - Purple or maroon localized area of discolored intact skin or blood-filled blister due to damage of underlying soft tissue from pressure and/or shear.  Wound Description (Comments): purple  Present on Admission:   Dressing Type Foam - Lift dressing to assess site every shift 01/20/22 0831   Local wound care.    Body mass index is 39.5 kg/m. Obesity;   Estimated body mass index is 39.5 kg/m as calculated from the following:   Height as of this encounter: '5\' 6"'$  (1.676 m).   Weight as of this encounter: 111 kg.  Code Status: full code.  DVT Prophylaxis:  heparin.  apixaban (ELIQUIS) tablet 5 mg   Level of Care: Level of care: Telemetry Medical Family Communication: none at bedside.   Disposition Plan:     Remains inpatient appropriate:  waiting for CIR.  Procedures:  Echocardiogram.   Consultants:   Cardiology PCCM.   Antimicrobials:   Anti-infectives (From admission, onward)    Start     Dose/Rate Route Frequency Ordered Stop   01/16/22 0000  meropenem (MERREM) 1 g in sodium chloride 0.9 % 100 mL IVPB  Status:   Discontinued        1 g 200 mL/hr over 30 Minutes Intravenous Every 24 hours 01/15/22 1035 01/15/22 1054   01/15/22 1200  meropenem (MERREM) 1 g in sodium chloride 0.9 % 100 mL IVPB        1 g 200 mL/hr over 30 Minutes Intravenous Every 12 hours 01/15/22 1054 01/18/22 1509   01/14/22 2330  meropenem (MERREM) 1 g in sodium chloride 0.9 % 100 mL IVPB        1 g 200 mL/hr over 30 Minutes Intravenous  Once 01/14/22 2231 01/15/22 0038   01/14/22  1815  cefTRIAXone (ROCEPHIN) 1 g in sodium chloride 0.9 % 100 mL IVPB  Status:  Discontinued        1 g 200 mL/hr over 30 Minutes Intravenous  Once 01/14/22 1805 01/14/22 2052        Medications  Scheduled Meds:  amiodarone  400 mg Oral BID   apixaban  5 mg Oral BID   aspirin  81 mg Per NG tube Daily   carvedilol  6.25 mg Oral BID WC   insulin aspart  2-6 Units Subcutaneous Q4H   insulin detemir  15 Units Subcutaneous BID   levothyroxine  50 mcg Oral Q0600   mouth rinse  15 mL Mouth Rinse 4 times per day   torsemide  80 mg Oral BID   Continuous Infusions:  sodium chloride Stopped (01/19/22 1056)   PRN Meds:.sodium chloride, acetaminophen, dextrose, metoprolol tartrate, mouth rinse, oxyCODONE    Subjective:   Susan [name] was seen and examined today. No new complaints today.    Objective:   Vitals:   01/19/22 2304 01/20/22 0300 01/20/22 0831 01/20/22 1200  BP: (!) 118/56 (!) 102/58 (!) 123/58 (!) 115/56  Pulse: 65 60 61 62  Resp: '19 17 12 16  '$ Temp: 98.6 F (37 C) 98.6 F (37 C) 97.7 F (36.5 C) 97.8 F (36.6 C)  TempSrc: Oral Oral Oral Oral  SpO2: 94% 91% 94% 94%  Weight:      Height:        Intake/Output Summary (Last 24 hours) at 01/20/2022 1523 Last data filed at 01/20/2022 1349 Gross per 24 hour  Intake 240 ml  Output 900 ml  Net -660 ml    Filed Weights   01/15/22 0047 01/16/22 0400  Weight: 108.2 kg 111 kg     Exam General exam: Appears calm and comfortable  Respiratory system: Clear to auscultation.  Respiratory effort normal. Cardiovascular system: S1 & S2 heard, RRR. No JVD,  No pedal edema. Gastrointestinal system: Abdomen is nondistended, soft and nontender. Normal bowel sounds heard. Central nervous system: Alert and oriented. No focal neurological deficits. Extremities: Symmetric 5 x 5 power. Skin: No rashes, lesions or ulcers Psychiatry: Mood & affect appropriate.      Data Reviewed:  I have personally reviewed following labs and imaging studies   CBC Lab Results  Component Value Date   WBC 6.9 01/20/2022   RBC 3.82 (L) 01/20/2022   HGB 10.3 (L) 01/20/2022   HCT 31.7 (L) 01/20/2022   MCV 83.0 01/20/2022   MCH 27.0 01/20/2022   PLT 112 (L) 01/20/2022   MCHC 32.5 01/20/2022   RDW 17.9 (H) 01/20/2022   LYMPHSABS 0.6 (L) 01/14/2022   MONOABS 0.6 01/14/2022   EOSABS 0.0 01/14/2022   BASOSABS 0.0 27/07/5007     Last metabolic panel Lab Results  Component Value Date   NA 130 (L) 01/20/2022   K 3.7 01/20/2022   CL 97 (L) 01/20/2022   CO2 25 01/20/2022   BUN 90 (H) 01/20/2022   CREATININE 3.38 (H) 01/20/2022   GLUCOSE 95 01/20/2022   GFRNONAA 14 (L) 01/20/2022   GFRAA 13 (L) 11/15/2020   CALCIUM 8.3 (L) 01/20/2022   PHOS 4.0 01/17/2022   PROT 6.3 (L) 01/14/2022   ALBUMIN 3.4 (L) 01/14/2022   BILITOT 1.9 (H) 01/14/2022   ALKPHOS 103 01/14/2022   AST 28 01/14/2022   ALT 24 01/14/2022   ANIONGAP 8 01/20/2022    CBG (last 3)  Recent Labs    01/20/22 0340 01/20/22 3818  01/20/22 1224  GLUCAP 87 105* 109*       Coagulation Profile: Recent Labs  Lab 01/14/22 2245  INR 1.3*      Radiology Studies: No results found.     Hosie Poisson M.D. [facility] 01/20/2022, 3:23 PM  Available via Epic secure chat 7am-7pm After 7 pm, please refer to night coverage provider listed on amion.

## 2022-01-20 NOTE — Inpatient Diabetes Management (Signed)
Inpatient Diabetes Program Recommendations  AACE/ADA: New Consensus Statement on Inpatient Glycemic Control (2015)  Target Ranges:  Prepandial:   less than 140 mg/dL      Peak postprandial:   less than 180 mg/dL (1-2 hours)      Critically ill patients:  140 - 180 mg/dL   Lab Results  Component Value Date   GLUCAP 105 (H) 01/20/2022   HGBA1C 12.2 (H) 03/05/2021     Diabetes history: DM2 Outpatient Diabetes medications: Omnipod Current orders for Inpatient glycemic control: Per Lovena Le from Dr. Almetta Lovely office, patients insulin pump settings are:  MN-5A- 1.05 units/hr  5A-9A- 1.15 units/hr  9A-3P- 1.8 units/hr  3P-MN- 1 unit/hr  Total 24 hour basal is 29.65 units/24 hours-  Patient uses Regular insulin in insulin pump and has a SSI that she uses if blood sugars are really high and also has NPH 15 units prn if blood sugar >300 mg/dL.   Spoke with patient at bedside.  She is current with Dr. [name] with endocrinology.  Sees her every 3 months.  She states she missed her appointment last week because she was here in the hospital.  She confirms she has Type 2 DM; was diagnosed 10 years ago.  She wears the Omnipod insulin pump and Freestyle Libre 2 which has alarms.    When asked what she thinks happened to lead to this hospitalization, she states, "I have no idea".  She does not have her Omnipod PDM here with her.  She sates she eats anything she can get her hands on, drinks 3 Pepsi's a day, and loves to eats sweets.  She states her glucose stays between 250-350 mg/dL.    Educated her on importance of glucose control, basic patho of DM, The Plate Method, CHO's, importance of eliminating caloric beverages, and long and short term complications of uncontrolled DM.  Ordered Living Well with Diabetes booklet.  She will reschedule her endocrinology appointment.  Will continue to follow while inpatient.  Thank you, Reche Dixon, MSN, [facility] Coordinator Inpatient Diabetes  Program [phone] (team pager from 8a-5p)

## 2022-01-20 NOTE — Progress Notes (Signed)
Speech Language Pathology Treatment: Dysphagia  Patient Details Name: Susan [name] MRN: [id number] DOB: [birth year] Today's Date: 01/20/2022 Time: 1173-5670 SLP Time Calculation (min) (ACUTE ONLY): 12 min  Assessment / Plan / Recommendation Clinical Impression  Pt alert and reports she is ready for diet advancement. Pt states she has no memory of previous ST encounter, however, her cognition appeared improved from last encounter that noted cognitive-based oropharyngeal dysphagia. Pt given thin liquid and regular consistency trials and displayed two instances of immediate and delayed cough after the regular consistency. Mastication appeared [facility] which is improved from previous encounter. Pt reports baseline cough after eating and a history of GERD and suspect coughing today may be esophageal based.Pt educated on diet recommendations and compensatory strategies. Recommend pt advance to regular/thin liquid diet and ST sign off (diet orders written).    HPI HPI: 71 year old female presents with abdominal pain, nausea and vomiting, noted to be in severe DKA, admitted to ICU. She was started on IV insulin infusion and anion gap closed. Hospital course complicated by atrial fibrillation, followed by non sustained V tach and elevated troponins. PMHx: obesity, CAD, HFrEF, CKD, HTN, HLD, DM, RBBB, PAD, neuropathy, Rt transmet amputation      SLP Plan  All goals met      Recommendations for follow up therapy are one component of a multi-disciplinary discharge planning process, led by the attending physician.  Recommendations may be updated based on patient status, additional functional criteria and insurance authorization.    Recommendations  Diet recommendations: Regular;Thin liquid Liquids provided via: Cup;Straw Medication Administration: Whole meds with liquid Supervision: Patient able to self feed Compensations: Slow rate;Small sips/bites Postural Changes and/or Swallow Maneuvers: Seated upright  90 degrees;Upright 30-60 min after meal                Oral Care Recommendations: Oral care BID Follow Up Recommendations: Follow physician's recommendations for discharge plan and follow up therapies Assistance recommended at discharge: PRN SLP Visit Diagnosis: Dysphagia, unspecified (R13.10) Plan: All goals met          Susann Givens Student SLP   01/20/2022, 12:35 PM

## 2022-01-20 NOTE — Telephone Encounter (Signed)
Pharmacy Patient Advocate Encounter  Insurance verification completed.    The patient is insured through [employer] Part D   The patient is currently admitted and ran test claims for the following: Eliquis, Xarelto.  Copays and coinsurance results were relayed to Inpatient clinical team.

## 2022-01-20 NOTE — TOC Benefit Eligibility Note (Signed)
Patient Teacher, English as a foreign language completed.    The patient is currently admitted and upon discharge could be taking Xarelto 20 mg.  The current 30 day co-pay is $136.91.   The patient is currently admitted and upon discharge could be taking Eliquis 5 mg.  The current 30 day co-pay is $132.45.   The patient is insured through [employer], [place] Patient Advocate Specialist [place] Patient Advocate Team Direct Number: [phone]  Fax: [phone]

## 2022-01-21 DIAGNOSIS — I509 Heart failure, unspecified: Secondary | ICD-10-CM | POA: Diagnosis not present

## 2022-01-21 DIAGNOSIS — E1310 Other specified diabetes mellitus with ketoacidosis without coma: Secondary | ICD-10-CM | POA: Diagnosis not present

## 2022-01-21 LAB — GLUCOSE, CAPILLARY
Glucose-Capillary: 98 mg/dL (ref 70–99)
Glucose-Capillary: 202 mg/dL — ABNORMAL HIGH (ref 70–99)
Glucose-Capillary: 243 mg/dL — ABNORMAL HIGH (ref 70–99)
Glucose-Capillary: 142 mg/dL — ABNORMAL HIGH (ref 70–99)

## 2022-01-21 LAB — CBC
HCT: 34.4 % — ABNORMAL LOW (ref 36.0–46.0)
Hemoglobin: 11.1 g/dL — ABNORMAL LOW (ref 12.0–15.0)
MCH: 26.9 pg (ref 26.0–34.0)
MCHC: 32.3 g/dL (ref 30.0–36.0)
MCV: 83.5 fL (ref 80.0–100.0)
Platelets: 160 10*3/uL (ref 150–400)
RBC: 4.12 MIL/uL (ref 3.87–5.11)
RDW: 17.9 % — ABNORMAL HIGH (ref 11.5–15.5)
WBC: 6.9 10*3/uL (ref 4.0–10.5)
nRBC: 0.3 % — ABNORMAL HIGH (ref 0.0–0.2)

## 2022-01-21 LAB — BASIC METABOLIC PANEL
Anion gap: 12 (ref 5–15)
BUN: 95 mg/dL — ABNORMAL HIGH (ref 8–23)
CO2: 23 mmol/L (ref 22–32)
Calcium: 8.7 mg/dL — ABNORMAL LOW (ref 8.9–10.3)
Chloride: 97 mmol/L — ABNORMAL LOW (ref 98–111)
Creatinine, Ser: 3.39 mg/dL — ABNORMAL HIGH (ref 0.44–1.00)
GFR, Estimated: 14 mL/min — ABNORMAL LOW (ref >60)
Glucose, Bld: 135 mg/dL — ABNORMAL HIGH (ref 70–99)
Potassium: 3.8 mmol/L (ref 3.5–5.1)
Sodium: 132 mmol/L — ABNORMAL LOW (ref 135–145)

## 2022-01-21 MED ORDER — POLYETHYLENE GLYCOL 3350 17 G PO PACK
17.0000 g | PACK | Freq: Every day | ORAL | Status: DC | PRN
  Administered 2022-01-21: 17 g via ORAL
  Filled 2022-01-21: qty 1

## 2022-01-21 NOTE — Progress Notes (Signed)
[facility]                                                                               Susan [name], is a 71 y.o. female, DOB - 1951-05-06, [id number] Admit date - 01/14/2022    Outpatient Primary MD for the patient is Susan Frizzle, MD  LOS - 7  days    Brief summary    71 year old female with HFrEF, cardiac disease and CKD stage IV who presented with abdominal [name], nausea and [name], noted to be in severe DKA, admitted to ICU under PCCM service, . Susan was started on IV insulin infusion and anion gap closed. Hospital course complicated by atrial fibrillation, followed by non sustained V tach and elevated troponins. Cardiology consulted and echocardiogram showed LVEF of 30 to 35% unchanged.     Assessment & Plan    Assessment and Plan:   Atrial Fibrillation:  Heart rate much improved Continue with amiodarone 400 mg BID for 5 days followed by amiodarone 200 mg daily . and coreg 6.25 mg BID.  Continue with aspirin 81 mg daily.  -Susan is on Eliquis for anticoagulation, I have discussed with her, Susan thinks Susan can afford her co-pay which is around $100.    DKA  Resolved. GAP closed.   Type 2 DM, insulin dependent:  CBG (last 3)  Recent Labs    01/21/22 0831 01/21/22 1313 01/21/22 1550  GLUCAP 98 202* 243*  CBG's ARE BETTER CONTROLLED.    Continue with Levemir 15 BID. Continue with SSI.  Last A1c is 12.2. on 10/22.  Repeat Hemoglobin A1c ordered and pending.  -He is on insulin pump at home, diabetic coordinator consulted to ensure pump is functioning appropriately   CAD s/p RCA Elevated troponins.  Continue with aspirin and prn BB.    Acute on chronic chronic systolic heart failure: Last echo showed LVEF of 30 to 35%.  Susan appears with some evidence of volume overload, but overall Susan received aggressive hydration initially due to her DKA, for now continue with torsemide 80 mg BID(Susan is net +5 L since admission, but Susan is -1600 so far  today.    ESBL UTI;  Completed Meropenem.    Hypothyroidism:  Resume synthroid 50 mcg daily.  TSH wnl.   Hyponatremia:    AKI superimposed on stage 4 CKD.  Suspect from dehydration and pre renal azotemia.  Baseline creatinine is around 3. Susan was admitted with a creatinine of 5.09.  Improved to 3..3, which appears to be her baseline.  Continue to monitor.  Recommend outpatient follow up with nephrology on discharge.   Therapy evaluation recommending ACUTE REHAB.   RN Pressure Injury Documentation: Pressure Injury 05/17/20 Buttocks Right;Left Stage 3 -  Full thickness tissue loss. Subcutaneous fat may be visible but bone, tendon or muscle are NOT exposed. Borders pink, wound bed yellow. Left buttocks wound 4cmLength, 1.5cm Width. Right buttocks wou (Active)  05/17/20 1702  Location: Buttocks  Location Orientation: Right;Left  Staging: Stage 3 -  Full thickness tissue loss. Subcutaneous fat may be visible but bone, tendon or muscle are NOT exposed.  Wound Description (Comments): Borders pink, wound bed yellow. Left buttocks  wound 4cmLength, 1.5cm Width. Right buttocks wound 3-1/2 cm Length, 1-1/2cm Width  Present on Admission: Yes     Pressure Injury 08/17/20 Left Stage 3 -  Full thickness tissue loss. Subcutaneous fat may be visible but bone, tendon or muscle are NOT exposed. 2 areas of healing stage 3 pressure injuries to left buttock (Active)  08/17/20   Location:   Location Orientation: Left  Staging: Stage 3 -  Full thickness tissue loss. Subcutaneous fat may be visible but bone, tendon or muscle are NOT exposed.  Wound Description (Comments): 2 areas of healing stage 3 pressure injuries to left buttock  Present on Admission: Yes     Pressure Injury 08/17/20 Buttocks Right Stage 3 -  Full thickness tissue loss. Subcutaneous fat may be visible but bone, tendon or muscle are NOT exposed. (Active)  08/17/20   Location: Buttocks  Location Orientation: Right  Staging: Stage  3 -  Full thickness tissue loss. Subcutaneous fat may be visible but bone, tendon or muscle are NOT exposed.  Wound Description (Comments):   Present on Admission: Yes     Pressure Injury 01/16/22 Buttocks Right Deep Tissue Pressure Injury - Purple or maroon localized area of discolored intact skin or blood-filled blister due to damage of underlying soft tissue from pressure and/or shear. Purple (Active)  01/16/22 2000  Location: Buttocks  Location Orientation: Right  Staging: Deep Tissue Pressure Injury - Purple or maroon localized area of discolored intact skin or blood-filled blister due to damage of underlying soft tissue from pressure and/or shear.  Wound Description (Comments): Purple  Present on Admission:   Dressing Type Foam - Lift dressing to assess site every shift 01/21/22 0800     Pressure Injury 01/16/22 Buttocks Left Deep Tissue Pressure Injury - Purple or maroon localized area of discolored intact skin or blood-filled blister due to damage of underlying soft tissue from pressure and/or shear. purple (Active)  01/16/22 2000  Location: Buttocks  Location Orientation: Left  Staging: Deep Tissue Pressure Injury - Purple or maroon localized area of discolored intact skin or blood-filled blister due to damage of underlying soft tissue from pressure and/or shear.  Wound Description (Comments): purple  Present on Admission:   Dressing Type Foam - Lift dressing to assess site every shift 01/20/22 1815   Local wound care.    Body mass index is 39.5 kg/m. Obesity;   Estimated body mass index is 39.5 kg/m as calculated from the following:   Height as of this encounter: '5\' 6"'$  (1.676 m).   Weight as of this encounter: 111 kg.  Code Status: full code.  DVT Prophylaxis:  heparin.  apixaban (ELIQUIS) tablet 5 mg   Level of Care: Level of care: Telemetry Medical Family Communication: none at bedside.   Disposition Plan:     Remains inpatient appropriate:  waiting for  CIR.  Procedures:  Echocardiogram.   Consultants:   Cardiology PCCM.   Antimicrobials:   Anti-infectives (From admission, onward)    Start     Dose/Rate Route Frequency Ordered Stop   01/16/22 0000  meropenem (MERREM) 1 g in sodium chloride 0.9 % 100 mL IVPB  Status:  Discontinued        1 g 200 mL/hr over 30 Minutes Intravenous Every 24 hours 01/15/22 1035 01/15/22 1054   01/15/22 1200  meropenem (MERREM) 1 g in sodium chloride 0.9 % 100 mL IVPB        1 g 200 mL/hr over 30 Minutes Intravenous Every 12 hours 01/15/22  1054 01/18/22 1509   01/14/22 2330  meropenem (MERREM) 1 g in sodium chloride 0.9 % 100 mL IVPB        1 g 200 mL/hr over 30 Minutes Intravenous  Once 01/14/22 2231 01/15/22 0038   01/14/22 1815  cefTRIAXone (ROCEPHIN) 1 g in sodium chloride 0.9 % 100 mL IVPB  Status:  Discontinued        1 g 200 mL/hr over 30 Minutes Intravenous  Once 01/14/22 1805 01/14/22 2052        Medications  Scheduled Meds:  amiodarone  400 mg Oral BID   apixaban  5 mg Oral BID   aspirin  81 mg Per NG tube Daily   carvedilol  6.25 mg Oral BID WC   insulin aspart  2-6 Units Subcutaneous TID WC   insulin detemir  15 Units Subcutaneous BID   levothyroxine  50 mcg Oral Q0600   torsemide  80 mg Oral BID   Continuous Infusions:  sodium chloride Stopped (01/19/22 1056)   PRN Meds:.sodium chloride, acetaminophen, dextrose, metoprolol tartrate, oxyCODONE    Subjective:   Susan [name], Susan [name], Susan [name], Susan reports good appetite.   Objective:   Vitals:   01/21/22 0420 01/21/22 0900 01/21/22 1034 01/21/22 1450  BP: (!) 140/73  (!) 135/58   Pulse: 68  74   Resp: 14  (!) 21   Temp: 98.7 F (37.1 C) 98.2 F (36.8 C) 98.2 F (36.8 C) 97.9 F (36.6 C)  TempSrc: Oral Oral Oral Oral  SpO2: 94%  94%   Weight:      Height:        Intake/Output Summary (Last 24 hours) at 01/21/2022 1623 Last data filed at 01/21/2022 1000 Gross  per 24 hour  Intake --  Output 2400 ml  Net -2400 ml   Filed Weights   01/15/22 0047 01/16/22 0400  Weight: 108.2 kg 111 kg     Exam  Awake Alert, Oriented X 3, Susan new F.N deficits, Normal affect Symmetrical Chest wall movement, Good air movement bilaterally, CTAB RRR,Susan Gallops,Rubs or new Murmurs, Susan Parasternal Heave +ve B.Sounds, Abd Soft, Susan tenderness, Susan rebound - guarding or rigidity. Susan Cyanosis, Clubbing ,+  edema, Susan new Rash or bruise       Data Reviewed:  I have personally reviewed following labs and imaging studies   CBC Lab Results  Component Value Date   WBC 6.9 01/21/2022   RBC 4.12 01/21/2022   HGB 11.1 (L) 01/21/2022   HCT 34.4 (L) 01/21/2022   MCV 83.5 01/21/2022   MCH 26.9 01/21/2022   PLT 160 01/21/2022   MCHC 32.3 01/21/2022   RDW 17.9 (H) 01/21/2022   LYMPHSABS 0.6 (L) 01/14/2022   MONOABS 0.6 01/14/2022   EOSABS 0.0 01/14/2022   BASOSABS 0.0 69/62/9528     Last metabolic panel Lab Results  Component Value Date   NA 132 (L) 01/21/2022   K 3.8 01/21/2022   CL 97 (L) 01/21/2022   CO2 23 01/21/2022   BUN 95 (H) 01/21/2022   CREATININE 3.39 (H) 01/21/2022   GLUCOSE 135 (H) 01/21/2022   GFRNONAA 14 (L) 01/21/2022   GFRAA 13 (L) 11/15/2020   CALCIUM 8.7 (L) 01/21/2022   PHOS 4.0 01/17/2022   PROT 6.3 (L) 01/14/2022   ALBUMIN 3.4 (L) 01/14/2022   BILITOT 1.9 (H) 01/14/2022   ALKPHOS 103 01/14/2022   AST 28 01/14/2022   ALT 24 01/14/2022   ANIONGAP 12  01/21/2022    CBG (last 3)  Recent Labs    01/21/22 0831 01/21/22 1313 01/21/22 1550  GLUCAP 98 202* 243*      Coagulation Profile: Recent Labs  Lab 01/14/22 2245  INR 1.3*     Radiology Studies: Susan results found.     Phillips Climes M.D. [facility] 01/21/2022, 4:23 PM  Available via Epic secure chat 7am-7pm After 7 pm, please refer to night coverage provider listed on amion.

## 2022-01-21 NOTE — Progress Notes (Addendum)
Rounding Note    Patient Name: Susan [name] Date of Encounter: 01/21/2022  [place] Cardiologist: Glori Bickers, MD   Subjective   More SOB today,  hands swollen no significant lower ext edema  Inpatient Medications    Scheduled Meds:  amiodarone  400 mg Oral BID   apixaban  5 mg Oral BID   aspirin  81 mg Per NG tube Daily   carvedilol  6.25 mg Oral BID WC   insulin aspart  2-6 Units Subcutaneous TID WC   insulin detemir  15 Units Subcutaneous BID   levothyroxine  50 mcg Oral Q0600   torsemide  80 mg Oral BID   Continuous Infusions:  sodium chloride Stopped (01/19/22 1056)   PRN Meds: sodium chloride, acetaminophen, dextrose, metoprolol tartrate, oxyCODONE   Vital Signs    Vitals:   01/20/22 1600 01/20/22 1955 01/20/22 2351 01/21/22 0420  BP: (!) 126/51 133/72 136/61 (!) 140/73  Pulse: 63 67 65 68  Resp: '14 16 18 14  '$ Temp: (!) 97.5 F (36.4 C) 98.7 F (37.1 C) 98.3 F (36.8 C) 98.7 F (37.1 C)  TempSrc: Oral Oral Oral Oral  SpO2: 95% 95% 94% 94%  Weight:      Height:        Intake/Output Summary (Last 24 hours) at 01/21/2022 1019 Last data filed at 01/21/2022 0428 Gross per 24 hour  Intake 240 ml  Output 800 ml  Net -560 ml      01/16/2022    4:00 AM 01/15/2022   12:47 AM 12/20/2021    3:12 PM  Last 3 Weights  Weight (lbs) 244 lb 11.4 oz 238 lb 8.6 oz 239 lb  Weight (kg) 111 kg 108.2 kg 108.41 kg      Telemetry    SR with PVCs  - Personally Reviewed  ECG    SR with RBBB chronic  - Personally Reviewed  Physical Exam   GEN: No acute distress.   Neck: No JVD sitting up in bed Cardiac: RRR, + premature beats lear to auscultation bilaterally. GI: Soft, nontender, non-distended  MS: + edema of upper ext; No deformity. Neuro:  Nonfocal  Psych: Normal affect   Labs    High Sensitivity Troponin:   Recent Labs  Lab 01/14/22 1650 01/14/22 2245 01/16/22 1345 01/16/22 1559  TROPONINIHS 55* 82* 837* 898*      Chemistry Recent Labs  Lab 01/14/22 1650 01/14/22 1654 01/14/22 2245 01/15/22 0135 01/16/22 1345 01/16/22 1559 01/17/22 0108 01/18/22 1334 01/19/22 0650 01/20/22 0325 01/21/22 0336  NA 112*   < > 114*   < > 122*   < > 130*   < > 131* 130* 132*  K 6.0*   < > 4.0   < > >7.5*   < > 3.7   < > 3.8 3.7 3.8  CL 67*   < > 73*   < > 99   < > 94*   < > 96* 97* 97*  CO2 9*  --  14*   < > 21*   < > 24   < > '25 25 23  '$ GLUCOSE >1,200*   < > >1,200*   < > 205*   < > 219*   < > 128* 95 135*  BUN 96*   < > 96*   < > 87*   < > 90*   < > 88* 90* 95*  CREATININE 5.09*   < > 5.29*   < > 3.66*   < >  3.68*   < > 3.29* 3.38* 3.39*  CALCIUM 8.1*  --  7.8*   < > 7.6*   < > 8.3*   < > 8.5* 8.3* 8.7*  MG  --   --  2.0  --  1.4*  --  2.4  --   --   --   --   PROT 6.3*  --   --   --   --   --   --   --   --   --   --   ALBUMIN 3.4*  --   --   --   --   --   --   --   --   --   --   AST 28  --   --   --   --   --   --   --   --   --   --   ALT 24  --   --   --   --   --   --   --   --   --   --   ALKPHOS 103  --   --   --   --   --   --   --   --   --   --   BILITOT 1.9*  --   --   --   --   --   --   --   --   --   --   GFRNONAA 8*  --  8*   < > 13*   < > 13*   < > 14* 14* 14*  ANIONGAP 36*  --  27*   < > 2*   < > 12   < > '10 8 12   '$ < > = values in this interval not displayed.    Lipids No results for input(s): "CHOL", "TRIG", "HDL", "LABVLDL", "LDLCALC", "CHOLHDL" in the last 168 hours.  Hematology Recent Labs  Lab 01/19/22 0650 01/20/22 0325 01/21/22 0336  WBC 6.8 6.9 6.9  RBC 3.68* 3.82* 4.12  HGB 10.1* 10.3* 11.1*  HCT 29.5* 31.7* 34.4*  MCV 80.2 83.0 83.5  MCH 27.4 27.0 26.9  MCHC 34.2 32.5 32.3  RDW 17.7* 17.9* 17.9*  PLT 76* 112* 160   Thyroid  Recent Labs  Lab 01/19/22 0650  TSH 3.443    BNP Recent Labs  Lab 01/15/22 0132  BNP 1,929.8*    DDimer No results for input(s): "DDIMER" in the last 168 hours.   Radiology    No results found.  Cardiac Studies   Limited echo  01/17/22:  1. Left ventricular ejection fraction, by estimation, is 30 to 35%. The  left ventricle has moderately decreased function. The left ventricle  demonstrates regional wall motion abnormalities (see scoring  diagram/findings for description). Left ventricular   diastolic function could not be evaluated.   2. Right ventricular systolic function is moderately reduced. The right  ventricular size is moderately enlarged. There is mildly elevated  pulmonary artery systolic pressure.   3. The mitral valve is abnormal. Trivial mitral valve regurgitation. No  evidence of mitral stenosis.   4. Tricuspid valve regurgitation is mild to moderate.   5. The aortic valve is normal in structure. Aortic valve regurgitation is  not visualized. No aortic stenosis is present.   6. The inferior vena cava is normal in size with greater than 50%  respiratory variability, suggesting right atrial pressure of 3 mmHg.  Patient Profile     71 y.o. female with a hx of morbid obesity, CAD with PCI to RCA, HFrEF, CKD stage IV, hypertension, hyperlipidemia, DM2, RBBB, TIA and PAD s/p transmetatarsal amputation now followed for elevated troponin and atrial fib. And VT after admit for DKA, acute metabolic encephalopathy.   Assessment & Plan    Atrial fibrillation +/1 atrial flutter vs SVT now maintaining SR - on 400 mg amiodarone BID x 5 days then 200 mg daily + coreg 6.25 mg BID - telemetry appears sinus rhythm with PVCs - continue amiodarone taper      Need for chronic anticoagulation This patients CHA2DS2-VASc Score and unadjusted Ischemic Stroke Rate (% per year) is equal to 11.2 % stroke rate/year from a score of 7 (female, age, DM, CAD, CHF, 2TIA) - currently on eliquis, but copays reported in epic are over $100 per month - may need to discuss coumadin     Wide complex rhythm - VT vs aberrancy - pt was asymptomatic, was in this rhythm for ~4 min - no recurrence on amiodarone     Elevated  troponin Flat, likely demand ischemia in the setting of DKA No ischemic workup     Chronic systolic heart failure/CM  LVEF 30-35% Diuresing on 80 mg torsemide BID - this is her home dose - GDMT PTA: torsemide, coreg, imdur - was taking metolazone every other week - net positive 6.6L, does not appear extremely volume up --followed in AHF clinic  --more dyspnea may need IV diuretic will defer to Dr. Acie Fredrickson     CAD Hx of RCA PCI (BMS 2000, BMS 2007) No chest pain Continue BB, no ASA given eliquis Continue statin      AoCKD DKA ESBL UTI - per primary     For questions or updates, please contact [place] Please consult [email or web address] for contact info under        Signed, Cecilie Kicks, NP  01/21/2022, 10:19 AM      Attending Note:   The patient was seen and examined.  Agree with assessment and plan as noted above.  Changes made to the above note as needed.  Patient seen and independently examined with Cecilie Kicks, NP.   We discussed all aspects of the encounter. I agree with the assessment and plan as stated above.   Atrial fib:   currently on amio.  Maintaining sinus rhythm She will need anticoagulation .  Currently on eliquis but its too expensive her her. She may need to be started on warfarin   2.  Wide complex tachycardia  ? VT vs. Aberrant conduction   3.  Chronic combined CHF Cont diuretics.      I have spent a total of 40 minutes with patient reviewing hospital  notes , telemetry, EKGs, labs and examining patient as well as establishing an assessment and plan that was discussed with the patient.  > 50% of time was spent in direct patient care.    Thayer Headings, Brooke Bonito., MD, [facility] 01/21/2022, 2:35 PM [address],  [place] Pager [phone]

## 2022-01-21 NOTE — Progress Notes (Signed)
Physical Therapy Treatment Patient Details Name: Susan [name] MRN: [id number] DOB: 09-02-1950 Today's Date: 01/21/2022   History of Present Illness 71 yo female admitted 8/22 with DKA.8/24 Afib with RVR. PMHx: obesity, CAD, HFrEF, CKD, HTN, HLD, DM, RBBB, PAD, neuropathy, Rt transmet amputation.    PT Comments    Pt received in chair, agreeable to therapy session with goal of return transfer from chair to bed. Pt quick to fatigue and needing consistent modA for transfers and bed mobility with increased time to perform and multimodal cues for safe technique. Pt reports insurance will not approve AIR but not currently agreeable to lower intensity therapies, although she would benefit most from short term moderate to high intensity post-acute rehab. Pt continues to benefit from PT services to progress toward functional mobility goals.    Recommendations for follow up therapy are one component of a multi-disciplinary discharge planning process, led by the attending physician.  Recommendations may be updated based on patient status, additional functional criteria and insurance authorization.  Follow Up Recommendations  Acute inpatient rehab (3hours/day)     Assistance Recommended at Discharge Frequent or constant Supervision/Assistance  Patient can return home with the following Two people to help with walking and/or transfers;Two people to help with bathing/dressing/bathroom;Direct supervision/assist for medications management;Help with stairs or ramp for entrance;Assistance with cooking/housework;Direct supervision/assist for financial management;Assist for transportation;Assistance with feeding   Equipment Recommendations  Other (comment) (TBD with progression)    Recommendations for Other Services       Precautions / Restrictions Precautions Precautions: Fall Precaution Comments: pt reports special orthotic shoes at home for R TMA but family cannot bring to hospital Restrictions Weight  Bearing Restrictions: No     Mobility  Bed Mobility Overal bed mobility: Needs Assistance Bed Mobility: Rolling, Sit to Sidelying Rolling: Min assist       Sit to sidelying: Mod assist General bed mobility comments: cues for log roll return to supine, poor carryover of some cues, needs BLE assist; decreased trunk control needing assist as well.    Transfers Overall transfer level: Needs assistance Equipment used: Rolling walker (2 wheels) Transfers: Sit to/from Stand, Bed to chair/wheelchair/BSC Sit to Stand: Mod assist   Step pivot transfers: Mod assist       General transfer comment: Mod demonstrational cueing for hand placement for sit to stand and increased time to steady upon rising.    Ambulation/Gait Ambulation/Gait assistance: Mod assist Gait Distance (Feet): 5 Feet Assistive device: Rolling walker (2 wheels) Gait Pattern/deviations: Shuffle, Leaning posteriorly, Ataxic       General Gait Details: pt anxious due to fear of falls and very unsteady, at times lifting RW up with posterior bias throughout despite cues to push down through hands into RW while stepping. Pivotal, backward and side steps along EOB all needing modA; needs assist for RW mgmt; poor foot clearance.       Balance Overall balance assessment: Needs assistance Sitting-balance support: Feet supported, Bilateral upper extremity supported Sitting balance-Leahy Scale: Fair Sitting balance - Comments: Pt is able to maintain balance in sitting with supervision. Postural control: Posterior lean, Left lateral lean Standing balance support: During functional activity, Reliant on assistive device for balance Standing balance-Leahy Scale: Poor Standing balance comment: Needs BUE support with use of the RW for support.                            Cognition Arousal/Alertness: Awake/alert Behavior During Therapy: WFL for tasks assessed/performed  Overall Cognitive Status: No family/caregiver  present to determine baseline cognitive functioning Area of Impairment: Problem solving, Safety/judgement                       Following Commands: Follows one step commands consistently, Follows one step commands with increased time, Follows multi-step commands inconsistently Safety/Judgement: Decreased awareness of safety, Decreased awareness of deficits Awareness: Emergent Problem Solving: Slow processing, Requires verbal cues, Requires tactile cues General Comments: multimodal cues for safe technique for transfers, needs manual assist with RW, anxious during mobility. Long discussion about disposition plan for home, pt with poor insight into deficits/need for skilled therapies and reports she had a bad experience at a skilled facility previously so now unwilling to consider any nursing home for therapy.        Exercises General Exercises - Lower Extremity Short Arc Quad: AROM, Both, 10 reps, Seated Hip Flexion/Marching: AROM, Both, 5 reps, Seated Other Exercises Other Exercises: encouraged pt to perform but pt reporting severe fatigue once back in supine so defers during session.    General Comments General comments (skin integrity, edema, etc.): BP 121/47 (68); HR 72 bpm supine, BP 120/64 (82) seated with feet on floor; BP 139/73 (90) HR 70's bpm, SpO2 WFL on RA      Pertinent Vitals/Pain Pain Assessment Pain Assessment: Faces Faces Pain Scale: Hurts little more Pain Location: Frontal headache Pain Descriptors / Indicators: Discomfort Pain Intervention(s): Monitored during session, Limited activity within patient's tolerance, Repositioned           PT Goals (current goals can now be found in the care plan section) Acute Rehab PT Goals Patient Stated Goal: return home, feed myself PT Goal Formulation: With patient Time For Goal Achievement: 02/01/22 Progress towards PT goals: Progressing toward goals    Frequency    Min 3X/week      PT Plan Current plan  remains appropriate       AM-PAC PT "6 Clicks" Mobility   Outcome Measure  Help needed turning from your back to your side while in a flat bed without using bedrails?: A Little Help needed moving from lying on your back to sitting on the side of a flat bed without using bedrails?: A Lot Help needed moving to and from a bed to a chair (including a wheelchair)?: A Lot Help needed standing up from a chair using your arms (e.g., wheelchair or bedside chair)?: A Lot Help needed to walk in hospital room?: Total Help needed climbing 3-5 steps with a railing? : Total 6 Click Score: 11    End of Session Equipment Utilized During Treatment: Gait belt Activity Tolerance: Patient tolerated treatment well;Patient limited by fatigue Patient left: in bed;with call bell/phone within reach;with bed alarm set;Other (comment) (BLE elevated on pillows, HOB ~30 deg) Nurse Communication: Mobility status (pt needs full supervision for meals) PT Visit Diagnosis: Other abnormalities of gait and mobility (R26.89);Difficulty in walking, not elsewhere classified (R26.2);Muscle weakness (generalized) (M62.81)     Time: 1610-9604 PT Time Calculation (min) (ACUTE ONLY): 29 min  Charges:  $Therapeutic Activity: 23-37 mins                     Kemiah Booz P., PTA Acute Rehabilitation Services Secure Chat Preferred 9a-5:30pm Office: [phone]    Dorathy Kinsman [facility] 01/21/2022, 5:14 PM

## 2022-01-21 NOTE — TOC Initial Note (Addendum)
Transition of Care [facility]) - Initial/Assessment Note    Patient Details  Name: Susan [name] MRN: [id number] Date of Birth: Apr 07, 1951  [facility] CM/SW Contact:    Carles Collet, RN Phone Number: 01/21/2022, 3:04 PM  Clinical Narrative:                 Unable to reach patient by phone, spoke w spouse. Discussed that HTA declined auth for CIR and patient declined SNF placement. Spouse aware, and agreeable to referral for [facility] services. Confirms that they have used [facility] the past and would like to use them again. Referral sent to [facility], pending. Spouse states that they have all needed DME for ambulation and bathroom at home. He states that the only that the patient doesn't have is a WC and she does not want one. TOC will continue to follow  [facility] declined, referral sent to [facility], accepted  Expected Discharge Plan: [place] Barriers to Discharge: Continued Medical Work up   Patient Goals and CMS Choice Patient states their goals for this hospitalization and ongoing recovery are:: to go home CMS Medicare.gov Compare Post Acute Care list provided to:: Other (Comment Required) Choice offered to / list presented to : Spouse  Expected Discharge Plan and Services Expected Discharge Plan: [place]   Discharge Planning Services: CM Consult Post Acute Care Choice: [place] arrangements for the past 2 months: Single Family Home                           HH Arranged: PT, OT [place] Agency: [place] ([place]) Date [place]: 01/21/22 Time [place]: 1504 Representative spoke with at [place]: Caryl Pina (pending)  Prior Living Arrangements/Services Living arrangements for the past 2 months: [place] with:: Spouse                   Activities of Daily Living      Permission Sought/Granted                  Emotional Assessment               Admission diagnosis:  DKA (diabetic ketoacidosis) ([place]) [E11.10] Pyuria [R82.81] Diabetic ketoacidosis without coma associated with type 2 diabetes mellitus ([place]) [E11.10] Chronic congestive heart failure, unspecified heart failure type ([place]) [I50.9] Patient Active Problem List   Diagnosis Date Noted   DKA (diabetic ketoacidosis) ([place]) 01/14/2022   NSTEMI (non-ST elevated myocardial infarction) ([place]) 03/05/2021   Acute renal failure superimposed on stage 4 chronic kidney disease ([place]) 08/22/2020   Hypoalbuminemia 05/25/2020   GERD (gastroesophageal reflux disease) 05/25/2020   Pressure injury of skin 05/17/2020   Type 2 diabetes mellitus with diabetic polyneuropathy, with long-term current use of insulin ([place]) 03/07/2020   Obesity, Class III, BMI 40-49.9 (morbid obesity) ([place]) 03/07/2020   Common bile duct (CBD) obstruction 05/28/2019   Benign neoplasm of ascending colon    Benign neoplasm of transverse colon    Benign neoplasm of descending colon    Benign neoplasm of sigmoid colon    Gastric polyps    Hyperkalemia 03/11/2019   Prolonged QT interval 03/11/2019   Onychomycosis 06/21/2018   Osteomyelitis of second toe of right foot ([place])    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 (H[facility])    PVD (peripheral vascular disease) ([place]) 10/26/2017   E-coli UTI 07/27/2017   Hypothyroidism 07/27/2017  PAH (pulmonary artery hypertension) ([facility])    Impaired ambulation 07/19/2017   Leg cramps 02/27/2017   Peripheral edema 01/12/2017   Diabetic neuropathy ([place]) 11/12/2016   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 mellitus ([place]) 10/24/2015   Anemia 10/03/2015   Generalized anxiety disorder 10/03/2015   Insomnia 10/03/2015   Hyperglycemia due to diabetes mellitus ([place]) 06/07/2015   Chronic diastolic CHF (congestive heart failure) ([place]) 06/07/2015   Non compliance with medical treatment 04/17/2014   Rotator cuff tear 03/14/2014   Class 3 obesity ([place]) 09/23/2013   Chronic  HFrEF (heart failure with reduced ejection fraction) ([place]) 06/03/2013   Hypotension 12/25/2012   Urinary incontinence    MDD (major depressive disorder) 11/12/2010   RBBB (right bundle branch block)    Coronary artery disease    Hyperlipemia 01/22/2009   Essential hypertension 01/22/2009   PCP:  [name], MD Pharmacy:   [id number] [place] [id number]: [phone]: [phone]    Social Determinants of Health (SDOH) Interventions    Readmission Risk Interventions    05/28/2020   12:50 PM 03/12/2020    2:48 PM 02/09/2020   12:30 PM  Readmission Risk Prevention Plan  Transportation Screening Complete Complete Complete  Medication Review (Press photographer Complete Complete Complete  PCP or Specialist appointment within 3-5 days of discharge Complete Complete Complete  HRI or Home Care Consult Complete Complete Complete  SW Recovery Care/Counseling Consult Complete Complete Complete  Palliative Care Screening Not Applicable Not Applicable Not Applicable  SSidneyNot Applicable Not Applicable Not Applicable

## 2022-01-21 NOTE — Progress Notes (Signed)
Occupational Therapy Treatment Patient Details Name: Susan [name] MRN: [id number] DOB: 05-Dec-1950 Today's Date: 01/21/2022   History of present illness 71 yo female admitted 8/22 with DKA.8/24 Afib with RVR. PMHx: obesity, CAD, HFrEF, CKD, HTN, HLD, DM, RBBB, PAD, neuropathy, Rt transmet amputation   OT comments  Pt completed supine to sit with max assist and then simulated toilet transfer at mod assist step pivot with use of the RW for support.  Oxygen sats maintained at 91% or better on room air throughout session.  Some increased anxiety and fear of falling initially, but this improved with therapist reassurance.  Feel pt will continue to benefit from acute care OT to help increase ADL performance.  Will need post acute AIR level therapy to reach modified independent level for home as her spouse works during the day and she will be alone for periods of time.     Recommendations for follow up therapy are one component of a multi-disciplinary discharge planning process, led by the attending physician.  Recommendations may be updated based on patient status, additional functional criteria and insurance authorization.    Follow Up Recommendations  Acute inpatient rehab (3hours/day)    Assistance Recommended at Discharge Frequent or constant Supervision/Assistance  Patient can return home with the following  A little help with walking and/or transfers;A little help with bathing/dressing/bathroom;Assistance with cooking/housework;Assist for transportation;Help with stairs or ramp for entrance   Equipment Recommendations  Other (comment) (TBD next venue of care)       Precautions / Restrictions Precautions Precautions: Fall Restrictions Weight Bearing Restrictions: No       Mobility Bed Mobility Overal bed mobility: Needs Assistance Bed Mobility: Supine to Sit, Sit to Supine     Supine to sit: HOB elevated, Max assist (HOB at approximately 10 degrees)          Transfers Overall  transfer level: Needs assistance Equipment used: Rolling walker (2 wheels) Transfers: Sit to/from Stand, Bed to chair/wheelchair/BSC Sit to Stand: Mod assist     Step pivot transfers: Mod assist     General transfer comment: Mod demonstrational cueing for hand placement for sit to stand.     Balance Overall balance assessment: Needs assistance Sitting-balance support: Feet supported, Bilateral upper extremity supported Sitting balance-Leahy Scale: Fair Sitting balance - Comments: Pt is able to maintain balance in sitting with supervision.   Standing balance support: During functional activity, Reliant on assistive device for balance Standing balance-Leahy Scale: Poor Standing balance comment: Needs BUE support with use of the RW for support.                           ADL either performed or assessed with clinical judgement   ADL Overall ADL's : Needs assistance/impaired                         Toilet Transfer: Moderate assistance;Stand-pivot;Rolling walker (2 wheels)   Toileting- Clothing Manipulation and Hygiene: Moderate assistance;Sit to/from stand       Functional mobility during ADLs: Moderate assistance;Rolling walker (2 wheels) (to take 4-5 small steps to the recliner from the EOB) General ADL Comments: Pt needed max assist for supine to sit with HOB at approximately 10 degrees.  Some slight increased anxiety with sit to stand secondary to fear of falling but this improved with therapist re-assurance.  Discussed possible need for a tub bench at home as pt states she didn't have a seat PTA  and had difficulty stepping over into the tub and standing for her shower.               Cognition Arousal/Alertness: Awake/alert Behavior During Therapy: Anxious Overall Cognitive Status: No family/caregiver present to determine baseline cognitive functioning Area of Impairment: Problem solving                       Following Commands: Follows one  step commands with increased time   Awareness: Emergent Problem Solving: Slow processing, Requires verbal cues, Requires tactile cues General Comments: Pt oriented to month and day of the week as well as reason for hospitalization.  Needed mod demonstrational cueing for sequencing rolling and for sit to stand.                   Pertinent Vitals/ Pain       Pain Assessment Pain Assessment: 0-10 Pain Score: 7  Pain Location: headache Pain Descriptors / Indicators: Discomfort, Shooting Pain Intervention(s): RN gave pain meds during session, Repositioned         Frequency  Min 2X/week        Progress Toward Goals  OT Goals(current goals can now be found in the care plan section)  Progress towards OT goals: Progressing toward goals  Acute Rehab OT Goals Patient Stated Goal: Pt wants to get stronger overall. OT Goal Formulation: With patient Time For Goal Achievement: 02/01/22 Potential to Achieve Goals: Good  Plan Discharge plan remains appropriate       AM-PAC OT "6 Clicks" Daily Activity     Outcome Measure   Help from another person eating meals?: None Help from another person taking care of personal grooming?: A Little Help from another person toileting, which includes using toliet, bedpan, or urinal?: A Lot Help from another person bathing (including washing, rinsing, drying)?: A Lot Help from another person to put on and taking off regular upper body clothing?: None Help from another person to put on and taking off regular lower body clothing?: A Lot 6 Click Score: 17    End of Session Equipment Utilized During Treatment: Rolling walker (2 wheels)  OT Visit Diagnosis: Other abnormalities of gait and mobility (R26.89);Muscle weakness (generalized) (M62.81);Other symptoms and signs involving cognitive function;Unsteadiness on feet (R26.81) Pain - part of body:  (headache)   Activity Tolerance Patient tolerated treatment well   Patient Left in chair;with  call bell/phone within reach;with chair alarm set   Nurse Communication Mobility status        Time: 6269-4854 OT Time Calculation (min): 45 min  Charges: OT General Charges $OT Visit: 1 Visit OT Treatments $Self Care/Home Management : 38-52 mins  Teige Rountree OTR/L 01/21/2022, 1:08 PM

## 2022-01-21 NOTE — Progress Notes (Signed)
Inpatient Rehabilitation Admissions Coordinator   I have received a denial from [employer] for Cir admit. I met with patient at bedside and she is aware. She refuses SNF and request home with [facility] and family assist.. I have alerted acute team and TOC. We will sign off.  Danne Baxter, RN, MSN Rehab Admissions Coordinator [phone] 01/21/2022 2:42 PM

## 2022-01-21 NOTE — Progress Notes (Signed)
  Inpatient Rehabilitation Admissions Coordinator   I met with patient at bedside. Reviewed estimated cost of care if approved for CIR admit by [employer]. I await their determination.  Danne Baxter, RN, MSN Rehab Admissions Coordinator [phone] 01/21/2022 10:53 AM

## 2022-01-22 DIAGNOSIS — R8281 Pyuria: Secondary | ICD-10-CM

## 2022-01-22 DIAGNOSIS — I5022 Chronic systolic (congestive) heart failure: Secondary | ICD-10-CM | POA: Diagnosis not present

## 2022-01-22 DIAGNOSIS — E1310 Other specified diabetes mellitus with ketoacidosis without coma: Secondary | ICD-10-CM | POA: Diagnosis not present

## 2022-01-22 DIAGNOSIS — E1110 Type 2 diabetes mellitus with ketoacidosis without coma: Secondary | ICD-10-CM | POA: Diagnosis not present

## 2022-01-22 LAB — HEMOGLOBIN A1C
Hgb A1c MFr Bld: >15.5 % — ABNORMAL HIGH (ref 4.8–5.6)
Mean Plasma Glucose: >398 mg/dL

## 2022-01-22 LAB — BASIC METABOLIC PANEL
Anion gap: 14 (ref 5–15)
BUN: 93 mg/dL — ABNORMAL HIGH (ref 8–23)
CO2: 23 mmol/L (ref 22–32)
Calcium: 8.6 mg/dL — ABNORMAL LOW (ref 8.9–10.3)
Chloride: 99 mmol/L (ref 98–111)
Creatinine, Ser: 3.4 mg/dL — ABNORMAL HIGH (ref 0.44–1.00)
GFR, Estimated: 14 mL/min — ABNORMAL LOW (ref >60)
Glucose, Bld: 141 mg/dL — ABNORMAL HIGH (ref 70–99)
Potassium: 3.6 mmol/L (ref 3.5–5.1)
Sodium: 136 mmol/L (ref 135–145)

## 2022-01-22 LAB — CBC
HCT: 34.8 % — ABNORMAL LOW (ref 36.0–46.0)
Hemoglobin: 11.5 g/dL — ABNORMAL LOW (ref 12.0–15.0)
MCH: 27.3 pg (ref 26.0–34.0)
MCHC: 33 g/dL (ref 30.0–36.0)
MCV: 82.7 fL (ref 80.0–100.0)
Platelets: 228 10*3/uL (ref 150–400)
RBC: 4.21 MIL/uL (ref 3.87–5.11)
RDW: 17.9 % — ABNORMAL HIGH (ref 11.5–15.5)
WBC: 8.5 10*3/uL (ref 4.0–10.5)
nRBC: 0 % (ref 0.0–0.2)

## 2022-01-22 LAB — GLUCOSE, CAPILLARY
Glucose-Capillary: 115 mg/dL — ABNORMAL HIGH (ref 70–99)
Glucose-Capillary: 201 mg/dL — ABNORMAL HIGH (ref 70–99)
Glucose-Capillary: 152 mg/dL — ABNORMAL HIGH (ref 70–99)
Glucose-Capillary: 184 mg/dL — ABNORMAL HIGH (ref 70–99)

## 2022-01-22 MED ORDER — AMIODARONE HCL 200 MG PO TABS
200.0000 mg | ORAL_TABLET | Freq: Two times a day (BID) | ORAL | Status: DC
  Administered 2022-01-22 (×2): 200 mg via ORAL
  Filled 2022-01-22 (×2): qty 1

## 2022-01-22 MED ORDER — INSULIN ASPART 100 UNIT/ML IJ SOLN
0.0000 [IU] | Freq: Three times a day (TID) | INTRAMUSCULAR | Status: DC
  Administered 2022-01-22: 2 [IU] via SUBCUTANEOUS
  Administered 2022-01-23: 1 [IU] via SUBCUTANEOUS

## 2022-01-22 MED ORDER — METOLAZONE 2.5 MG PO TABS
2.5000 mg | ORAL_TABLET | Freq: Once | ORAL | Status: AC
  Administered 2022-01-22: 2.5 mg via ORAL
  Filled 2022-01-22: qty 1

## 2022-01-22 MED ORDER — POTASSIUM CHLORIDE CRYS ER 20 MEQ PO TBCR
20.0000 meq | EXTENDED_RELEASE_TABLET | Freq: Two times a day (BID) | ORAL | Status: DC
  Administered 2022-01-22 – 2022-01-23 (×3): 20 meq via ORAL
  Filled 2022-01-22 (×3): qty 1

## 2022-01-22 NOTE — Progress Notes (Signed)
Rounding Note    Patient Name: Susan [name] Date of Encounter: 01/22/2022  [place] Cardiologist: Glori Bickers, MD   Subjective    Seems to be better today  Still has edema in her legs and belly  I/O are -3.1 liters so far this admission     Inpatient Medications    Scheduled Meds:  amiodarone  400 mg Oral BID   apixaban  5 mg Oral BID   aspirin  81 mg Per NG tube Daily   carvedilol  6.25 mg Oral BID WC   insulin aspart  2-6 Units Subcutaneous TID WC   insulin detemir  15 Units Subcutaneous BID   levothyroxine  50 mcg Oral Q0600   torsemide  80 mg Oral BID   Continuous Infusions:  sodium chloride Stopped (01/19/22 1056)   PRN Meds: sodium chloride, acetaminophen, dextrose, metoprolol tartrate, oxyCODONE, polyethylene glycol   Vital Signs    Vitals:   01/21/22 1600 01/21/22 2000 01/22/22 0400 01/22/22 0800  BP: 139/73 (!) 121/51 (!) 143/61   Pulse: 70 65 60   Resp: 15 16 (!) 9   Temp:  98 F (36.7 C) 97.6 F (36.4 C) 97.6 F (36.4 C)  TempSrc:  Oral Oral Oral  SpO2: 92% 92% 94%   Weight:      Height:        Intake/Output Summary (Last 24 hours) at 01/22/2022 0846 Last data filed at 01/22/2022 0818 Gross per 24 hour  Intake 240 ml  Output 1900 ml  Net -1660 ml       01/16/2022    4:00 AM 01/15/2022   12:47 AM 12/20/2021    3:12 PM  Last 3 Weights  Weight (lbs) 244 lb 11.4 oz 238 lb 8.6 oz 239 lb  Weight (kg) 111 kg 108.2 kg 108.41 kg      Telemetry    SR with PVCs  - Personally Reviewed  ECG    SR with RBBB chronic  - Personally Reviewed  Physical Exam   GEN: morbidly obese female Neck: No JVD sitting up in bed Cardiac: RRR, + premature beats lear to auscultation bilaterally. GI: Soft, nontender, non-distended  MS: 1+ leg edema .   Some abdominal ascites.  Neuro:  Nonfocal  Psych: Normal affect   Labs    High Sensitivity Troponin:   Recent Labs  Lab 01/14/22 1650 01/14/22 2245 01/16/22 1345 01/16/22 1559   TROPONINIHS 55* 82* 837* 898*      Chemistry Recent Labs  Lab 01/16/22 1345 01/16/22 1559 01/17/22 0108 01/18/22 1334 01/19/22 0650 01/20/22 0325 01/21/22 0336  NA 122*   < > 130*   < > 131* 130* 132*  K >7.5*   < > 3.7   < > 3.8 3.7 3.8  CL 99   < > 94*   < > 96* 97* 97*  CO2 21*   < > 24   < > '25 25 23  '$ GLUCOSE 205*   < > 219*   < > 128* 95 135*  BUN 87*   < > 90*   < > 88* 90* 95*  CREATININE 3.66*   < > 3.68*   < > 3.29* 3.38* 3.39*  CALCIUM 7.6*   < > 8.3*   < > 8.5* 8.3* 8.7*  MG 1.4*  --  2.4  --   --   --   --   GFRNONAA 13*   < > 13*   < > 14* 14* 14*  ANIONGAP 2*   < > 12   < > '10 8 12   '$ < > = values in this interval not displayed.     Lipids No results for input(s): "CHOL", "TRIG", "HDL", "LABVLDL", "LDLCALC", "CHOLHDL" in the last 168 hours.  Hematology Recent Labs  Lab 01/19/22 0650 01/20/22 0325 01/21/22 0336  WBC 6.8 6.9 6.9  RBC 3.68* 3.82* 4.12  HGB 10.1* 10.3* 11.1*  HCT 29.5* 31.7* 34.4*  MCV 80.2 83.0 83.5  MCH 27.4 27.0 26.9  MCHC 34.2 32.5 32.3  RDW 17.7* 17.9* 17.9*  PLT 76* 112* 160    Thyroid  Recent Labs  Lab 01/19/22 0650  TSH 3.443     BNP No results for input(s): "BNP", "PROBNP" in the last 168 hours.   DDimer No results for input(s): "DDIMER" in the last 168 hours.   Radiology    No results found.  Cardiac Studies   Limited echo 01/17/22:  1. Left ventricular ejection fraction, by estimation, is 30 to 35%. The  left ventricle has moderately decreased function. The left ventricle  demonstrates regional wall motion abnormalities (see scoring  diagram/findings for description). Left ventricular   diastolic function could not be evaluated.   2. Right ventricular systolic function is moderately reduced. The right  ventricular size is moderately enlarged. There is mildly elevated  pulmonary artery systolic pressure.   3. The mitral valve is abnormal. Trivial mitral valve regurgitation. No  evidence of mitral stenosis.    4. Tricuspid valve regurgitation is mild to moderate.   5. The aortic valve is normal in structure. Aortic valve regurgitation is  not visualized. No aortic stenosis is present.   6. The inferior vena cava is normal in size with greater than 50%  respiratory variability, suggesting right atrial pressure of 3 mmHg.   Patient Profile     71 y.o. female with a hx of morbid obesity, CAD with PCI to RCA, HFrEF, CKD stage IV, hypertension, hyperlipidemia, DM2, RBBB, TIA and PAD s/p transmetatarsal amputation now followed for elevated troponin and atrial fib. And VT after admit for DKA, acute metabolic encephalopathy.   Assessment & Plan    Atrial fibrillation +/1 atrial flutter vs SVT now maintaining SR  I have reduced amiodarone to 200 mg BID  She is maintaining NSR      Need for chronic anticoagulation This patients CHA2DS2-VASc Score and unadjusted Ischemic Stroke Rate (% per year) is equal to 11.2 % stroke rate/year from a score of 7 (female, age, DM, CAD, CHF, 2TIA) - currently on eliquis, but copays reported in epic are over $100 per month - may need to discuss coumadin     Wide complex rhythm - VT vs aberrancy - pt was asymptomatic, was in this rhythm for ~4 min Doing well , no recurrence on amiodarone      Elevated troponin Flat, likely demand ischemia in the setting of DKA No ischemic workup     Chronic systolic heart failure/CM   EF 30-35% Slowly diuresing       CAD Hx of RCA PCI (BMS 2000, BMS 2007) Continue asa, beta blockers No angina       AoCKD DKA ESBL UTI - per primary     For questions or updates, please contact [place] Please consult [email or web address] for contact info under        Signed, Mertie Moores, MD  01/22/2022, 8:46 AM

## 2022-01-22 NOTE — Progress Notes (Signed)
Inpatient Diabetes Program Recommendations  AACE/ADA: New Consensus Statement on Inpatient Glycemic Control (2015)  Target Ranges:  Prepandial:   less than 140 mg/dL      Peak postprandial:   less than 180 mg/dL (1-2 hours)      Critically ill patients:  140 - 180 mg/dL   Lab Results  Component Value Date   GLUCAP 115 (H) 01/22/2022   HGBA1C 12.2 (H) 03/05/2021    Review of Glycemic Control  Latest Reference Range & Units 01/21/22 13:13 01/21/22 15:50 01/21/22 21:07 01/22/22 08:16  Glucose-Capillary 70 - 99 mg/dL 202 (H) 243 (H) 142 (H) 115 (H)   Diabetes history: DM2 Outpatient Diabetes medications: Omnipod Current orders for Inpatient glycemic control: Per Lovena Le from Dr. Almetta Lovely office, patients insulin pump settings are:  MN-5A- 1.05 units/hr  5A-9A- 1.15 units/hr  9A-3P- 1.8 units/hr  3P-MN- 1 unit/hr  Total 24 hour basal is 29.65 units/24 hours-  Patient uses Regular insulin in insulin pump and has a SSI that she uses if blood sugars are really high and also has NPH 15 units prn if blood sugar >300 mg/dL.  Current orders for Inpatient glycemic control:  Novolog 2-6 units tid with meals Levemir 15 units bid  Inpatient Diabetes Program Recommendations:    Consider d/c of the ICU protocol and start Novolog sensitive tid with meals and HS plus Novolog 3 units tid with meals (hold if patient eats less than 50% or NPO).  For discharge, recommend d/c on Basal/bolus insulin. She will need to follow-up with Dr. [name] on restart of insulin pump. Unsure why her blood sugars were so high on admit- Daughter states pump not working. Needs to f/u with Dr. [name] ASAP regarding restart of insulin pump.  Thanks,  Adah Perl, RN, BC-ADM Inpatient Diabetes Coordinator Pager [phone]  (8a-5p)

## 2022-01-22 NOTE — Progress Notes (Signed)
Physical Therapy Treatment Patient Details Name: Susan [name] MRN: [id number] DOB: [birth year] Today's Date: 01/22/2022   History of Present Illness 71 yo female admitted 8/22 with DKA.8/24 Afib with RVR. PMHx: obesity, CAD, HFrEF, CKD, HTN, HLD, DM, RBBB, PAD, neuropathy, Rt transmet amputation.    PT Comments    Pt received in recliner, agreeable to therapy session with plan to progress gait and transfer training. Pt able to perform gait trials x2 ~67ft ea prior to fatigue and needing up to heavy minA for sit<>stand from recliner and elevated BSC this date. Pt encouraged to ambulate to bathroom or [facility] with nursing staff during the day instead of relying on purewick in order to gain strength, NT notified. Reviewed supine/seated BLE exercises for strengthening, pt receptive will need reinforcement. Pt continues to benefit from PT services to progress toward functional mobility goals.   Recommendations for follow up therapy are one component of a multi-disciplinary discharge planning process, led by the attending physician.  Recommendations may be updated based on patient status, additional functional criteria and insurance authorization.  Follow Up Recommendations  Acute inpatient rehab (3hours/day)     Assistance Recommended at Discharge Frequent or constant Supervision/Assistance  Patient can return home with the following Two people to help with walking and/or transfers;Two people to help with bathing/dressing/bathroom;Direct supervision/assist for medications management;Help with stairs or ramp for entrance;Assistance with cooking/housework;Direct supervision/assist for financial management;Assist for transportation;Assistance with feeding   Equipment Recommendations  Other (comment) (TBD with progression)    Recommendations for Other Services       Precautions / Restrictions Precautions Precautions: Fall Precaution Comments: pt reports special orthotic shoes at home for R TMA but  family cannot bring to hospital Restrictions Weight Bearing Restrictions: No     Mobility  Bed Mobility Overal bed mobility: Needs Assistance             General bed mobility comments: received/remained in chair    Transfers Overall transfer level: Needs assistance Equipment used: Rolling walker (2 wheels) Transfers: Sit to/from Stand, Bed to chair/wheelchair/BSC Sit to Stand: Min assist           General transfer comment: Min instructional cueing for hand placement for sit to stand with use of the RW from recliner and BSC heights    Ambulation/Gait Ambulation/Gait assistance: Min assist Gait Distance (Feet): 20 Feet (x2) Assistive device: Rolling walker (2 wheels) Gait Pattern/deviations: Step-through pattern, Decreased stride length, Decreased dorsiflexion - right, Decreased dorsiflexion - left       General Gait Details: improved posture this date, cues for environmental awareness as pt nearly bumping L hand and RW handle on sink counter as she passed by. Increased WOB with exertion but SpO2 WFL on RA   Stairs             Wheelchair Mobility    Modified Rankin (Stroke Patients Only)       Balance Overall balance assessment: Needs assistance   Sitting balance-Leahy Scale: Fair Sitting balance - Comments: Pt is able to maintain balance in sitting with supervision.   Standing balance support: During functional activity, Reliant on assistive device for balance, Single extremity supported Standing balance-Leahy Scale: Poor Standing balance comment: Needs unilateral support for static standing and BUE support for mobility with use of the RW.                            Cognition Arousal/Alertness: Awake/alert Behavior During Therapy: WFL for tasks assessed/performed  Overall Cognitive Status: No family/caregiver present to determine baseline cognitive functioning Area of Impairment: Safety/judgement, Problem solving                        Following Commands: Follows one step commands consistently, Follows multi-step commands with increased time Safety/Judgement: Decreased awareness of deficits Awareness: Emergent Problem Solving: Slow processing, Requires verbal cues General Comments: Pt with decreased awareness of deficits.  Declines SNF rehab but doesn't have a good plan for what she will do at home when her spouse is at work. She reports having a son that lives with her "some days" of the week but he has a hx ABI and not sure how much assist he can provide.        Exercises General Exercises - Lower Extremity Short Arc Quad: AROM, Both, 10 reps, Seated Hip ABduction/ADduction: AAROM, Both, 10 reps, Supine (reclined) Other Exercises Other Exercises: ankle pumps, glute sets, quad sets x10 reps, encouraged her to perform hourly    General Comments General comments (skin integrity, edema, etc.): SpO2 94% on RA; HR 77 bpm      Pertinent Vitals/Pain Pain Assessment Pain Assessment: No/denies pain Pain Intervention(s): Monitored during session, Repositioned           PT Goals (current goals can now be found in the care plan section) Acute Rehab PT Goals Patient Stated Goal: return home, feed myself PT Goal Formulation: With patient Time For Goal Achievement: 02/01/22 Progress towards PT goals: Progressing toward goals    Frequency    Min 3X/week      PT Plan Current plan remains appropriate       AM-PAC PT "6 Clicks" Mobility   Outcome Measure  Help needed turning from your back to your side while in a flat bed without using bedrails?: A Little Help needed moving from lying on your back to sitting on the side of a flat bed without using bedrails?: A Lot Help needed moving to and from a bed to a chair (including a wheelchair)?: A Lot (mod cues) Help needed standing up from a chair using your arms (e.g., wheelchair or bedside chair)?: A Little Help needed to walk in hospital room?: A  Little Help needed climbing 3-5 steps with a railing? : Total 6 Click Score: 14    End of Session Equipment Utilized During Treatment: Gait belt Activity Tolerance: Patient tolerated treatment well Patient left: in chair;with call bell/phone within reach;with chair alarm set;Other (comment) (pt received with no linens on bed, agreeable to remain in chair for dinner) Nurse Communication: Mobility status PT Visit Diagnosis: Other abnormalities of gait and mobility (R26.89);Difficulty in walking, not elsewhere classified (R26.2);Muscle weakness (generalized) (M62.81)     Time: 0102-7253 PT Time Calculation (min) (ACUTE ONLY): 16 min  Charges:  $Gait Training: 8-22 mins                     Shaleena Crusoe P., PTA Acute Rehabilitation Services Secure Chat Preferred 9a-5:30pm Office: [phone]    [place] 01/22/2022, 5:01 PM

## 2022-01-22 NOTE — Plan of Care (Signed)
  Problem: Education: Goal: Ability to describe self-care measures that may prevent or decrease complications (Diabetes Survival Skills Education) will improve Outcome: Progressing   Problem: Coping: Goal: Ability to adjust to condition or change in health will improve Outcome: Progressing   Problem: Fluid Volume: Goal: Ability to maintain a balanced intake and output will improve Outcome: Progressing   

## 2022-01-22 NOTE — Plan of Care (Signed)
  Problem: Education: Goal: Ability to describe self-care measures that may prevent or decrease complications (Diabetes Survival Skills Education) will improve 01/22/2022 0152 by Zadie Rhine, RN Outcome: Progressing 01/22/2022 0138 by Zadie Rhine, RN Outcome: Progressing   Problem: Coping: Goal: Ability to adjust to condition or change in health will improve 01/22/2022 0152 by Zadie Rhine, RN Outcome: Progressing 01/22/2022 0138 by Zadie Rhine, RN Outcome: Progressing   Problem: Fluid Volume: Goal: Ability to maintain a balanced intake and output will improve 01/22/2022 0152 by Zadie Rhine, RN Outcome: Progressing 01/22/2022 0138 by Zadie Rhine, RN Outcome: Progressing

## 2022-01-22 NOTE — Progress Notes (Signed)
Occupational Therapy Treatment Patient Details Name: Susan [name] MRN: [id number] DOB: [birth year] Today's Date: 01/22/2022   History of present illness 71 yo female admitted 8/22 with DKA.8/24 Afib with RVR. PMHx: obesity, CAD, HFrEF, CKD, HTN, HLD, DM, RBBB, PAD, neuropathy, Rt transmet amputation.   OT comments  Pt currently overall min assist level for toilet transfers and toileting with use of the RW as well as min to mod for LB bathing and dressing.  Feel she is making steady progress with OT but would recommend 24 hr initial supervision at discharge.  Pt refuses short term SNF for follow-up and does not have consistent 24 hour supervision from spouse as he works.  They have a son that lives in the home as well but has had a BI, and I'm unsure if he can provide supervision/assistance.  Recommend continued acute care OT to address current ADL dependence with recommendation for HHOT if pt continues to decline SNF.     Recommendations for follow up therapy are one component of a multi-disciplinary discharge planning process, led by the attending physician.  Recommendations may be updated based on patient status, additional functional criteria and insurance authorization.    Follow Up Recommendations  Home health OT    Assistance Recommended at Discharge Frequent or constant Supervision/Assistance  Patient can return home with the following  A little help with walking and/or transfers;A little help with bathing/dressing/bathroom;Assistance with cooking/housework;Assist for transportation;Help with stairs or ramp for entrance   Equipment Recommendations  None recommended by OT       Precautions / Restrictions Precautions Precautions: Fall Precaution Comments: pt reports special orthotic shoes at home for R TMA but family cannot bring to hospital Restrictions Weight Bearing Restrictions: No       Mobility Bed Mobility Overal bed mobility: Needs Assistance Bed Mobility: Supine to Sit,  Rolling Rolling: Min assist   Supine to sit: Min assist     General bed mobility comments: Assist for rolling to the left and bringing trunk to sitting.    Transfers Overall transfer level: Needs assistance Equipment used: Rolling walker (2 wheels) Transfers: Sit to/from Stand, Bed to chair/wheelchair/BSC Sit to Stand: Min assist     Step pivot transfers: Min assist     General transfer comment: Min instructional cueing for hand placement for sit to stand with use of the RW.     Balance Overall balance assessment: Needs assistance   Sitting balance-Leahy Scale: Fair Sitting balance - Comments: Pt is able to maintain balance in sitting with supervision.   Standing balance support: During functional activity, Reliant on assistive device for balance, Single extremity supported Standing balance-Leahy Scale: Poor Standing balance comment: Needs unilateral support for standing while completing peri washing and BUE support for mobility with use of the RW.                           ADL either performed or assessed with clinical judgement   ADL Overall ADL's : Needs assistance/impaired Eating/Feeding: Independent;Sitting   Grooming: Wash/dry hands;Wash/dry face;Sitting;Set up   Upper Body Bathing: Supervision/ safety;Sitting   Lower Body Bathing: Minimal assistance;Sit to/from stand   Upper Body Dressing : Minimal assistance;Sitting Upper Body Dressing Details (indicate cue type and reason): hospital gown Lower Body Dressing: Moderate assistance;Sit to/from stand   Toilet Transfer: Moderate assistance;BSC/3in1;Ambulation;Rolling walker (2 wheels) Toilet Transfer Details (indicate cue type and reason): simulated Toileting- Clothing Manipulation and Hygiene: Minimal assistance;Sit to/from stand  Functional mobility during ADLs: Minimal assistance;Rolling walker (2 wheels) (ambulate from one side of the bed to the recliner on the other side.) General ADL  Comments: Pt moving better than yesterday but would still recommend initial 24 hr supervision at discharge home for safety.  Pt refusing SNF rehab at this time.  Educated pt on retrograde massage, gross digit flexion/extension, and positioning for increased edema in the right hand.               Cognition Arousal/Alertness: Awake/alert Behavior During Therapy: WFL for tasks assessed/performed Overall Cognitive Status: No family/caregiver present to determine baseline cognitive functioning Area of Impairment: Safety/judgement, Problem solving                       Following Commands: Follows one step commands consistently, Follows multi-step commands with increased time Safety/Judgement: Decreased awareness of deficits Awareness: Emergent Problem Solving: Slow processing, Requires verbal cues General Comments: Pt with decreased awareness of deficits.  Declines SNF rehab but doesn't have a good plan for what she will do at home when her spouse is at work.  She reports having a son that lives with her but he has a BI and not sure how much assist he can provide.                   Pertinent Vitals/ Pain       Pain Assessment Pain Assessment: Faces Pain Score: 0-No pain         Frequency  Min 2X/week        Progress Toward Goals  OT Goals(current goals can now be found in the care plan section)  Progress towards OT goals: Progressing toward goals;Goals met and updated - see care plan  Acute Rehab OT Goals Patient Stated Goal: Pt wants to go home and not SNF OT Goal Formulation: With patient Time For Goal Achievement: 02/05/22 Potential to Achieve Goals: Good  Plan Discharge plan remains appropriate       AM-PAC OT "6 Clicks" Daily Activity     Outcome Measure   Help from another person eating meals?: None Help from another person taking care of personal grooming?: A Little Help from another person toileting, which includes using toliet, bedpan, or  urinal?: A Little Help from another person bathing (including washing, rinsing, drying)?: A Little Help from another person to put on and taking off regular upper body clothing?: A Little Help from another person to put on and taking off regular lower body clothing?: A Lot 6 Click Score: 18    End of Session Equipment Utilized During Treatment: Rolling walker (2 wheels)  OT Visit Diagnosis: Other abnormalities of gait and mobility (R26.89);Muscle weakness (generalized) (M62.81);Other symptoms and signs involving cognitive function;Unsteadiness on feet (R26.81)   Activity Tolerance Patient tolerated treatment well   Patient Left in chair;with call bell/phone within reach   Nurse Communication Mobility status        Time: 7425-9563 OT Time Calculation (min): 63 min  Charges: OT General Charges $OT Visit: 1 Visit OT Treatments $Self Care/Home Management : 53-67 mins  Zephaniah Enyeart OTR/L 01/22/2022, 2:49 PM

## 2022-01-22 NOTE — Consult Note (Signed)
[facility] CM Inpatient Consult   01/22/2022  Susan [name] Dec 19, 1950 [id number]   [facility] [[name]]  Accountable Care Organization [ACO] Patient: [employer]   Primary Care Provider: Donita Brooks, MD, [facility], is an Independent Embedded provider with a Care Management team and program and is listed for the [facility] follow up needs  Addendum:  01/23/22 1:55 pm Call to patient's bedside without success to follow up on now home with home health and having [facility] Care Coordinator to follow up. Unable to reach patient will reach out to Care Coordinator listed on Care Team for updates and follow up.    01/22/22: Patient screened for length of stay hospitalization with noted high risk score for unplanned readmission risk.  Reviewed to assess for potential   [[name]] Care Management service with Embedded team needs for post hospital transition for readmission prevention. Review of patient's medical record reveals patient is active with the Embedded Care Management team prior to admission. Notes patient Hgb A1C >15.5%. Reviewed inpatient TOC and Rehab consult for disposition needs.     Plan:  Continue to follow progress and disposition to assess for post hospital care management needs.  Update team on transitional needs.   For questions contact:    [name], RN BSN CCM [facility]  [phone] business mobile phone Toll free office [phone]  Fax number: [phone] [email or web address]

## 2022-01-22 NOTE — Progress Notes (Signed)
PROGRESS NOTE        PATIENT DETAILS Name: Susan [name] Age: 71 y.o. Sex: female Date of Birth: Jan 25, 1951 Admit Date: 01/14/2022 Admitting Physician Audria Nine, DO DJM:EQASTMH, [name], MD  Brief Summary: Patient is a 71 y.o.  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  Upon stability-patient was transferred to [facility] service.  Significant events: 8/22>> Admitted to ICU 8/24>> transitioned off Endotool, eeg triphasic consistent with encephalopathy 8/25>> taking PO, off D5 8/26>>transfer to [facility]   Significant studies: 8/22>> CXR: No PNA 8/22>> RUQ ultrasound: Distended gallbladder with sludge/stones.  Murphy sign is negative.  Left renal cortex appears slightly echogenic. 8/23>> CXR: No acute cardiopulmonary abnormality. 8/23>>CT Head>> no acute intracranial abnormality 8/25>> Echo: EF 96-22%, RV systolic function moderately reduced.  Significant microbiology data: 8/23>> urine culture: Multiple species 8/23>> blood culture: No growth  Procedures: None  Consults: PCCM, cardiology  Subjective: Lying comfortably in bed-denies any chest pain or shortness of breath.  Objective: Vitals: Blood pressure 126/63, pulse 66, temperature 97.6 F (36.4 C), temperature source Oral, resp. rate 12, height '5\' 6"'$  (1.676 m), weight 111 kg, SpO2 93 %.   Exam: Gen Exam:Alert awake-not in any distress HEENT:atraumatic, normocephalic Chest: B/L clear to auscultation anteriorly CVS:S1S2 regular Abdomen:soft non tender, non distended Extremities:++ edema Neurology: Non focal Skin: no rash  Pertinent Labs/Radiology:    Latest Ref Rng & Units 01/22/2022    9:19 AM 01/21/2022    3:36 AM 01/20/2022    3:25 AM  CBC  WBC 4.0 - 10.5 K/uL 8.5   6.9  6.9   Hemoglobin 12.0 - 15.0 g/dL 11.5  11.1  10.3   Hematocrit 36.0 - 46.0 % 34.8  34.4  31.7   Platelets 150 - 400 K/uL 228  160  112     Lab Results  Component Value Date   NA 136 01/22/2022   K 3.6 01/22/2022   CL 99 01/22/2022   CO2 23 01/22/2022     Assessment/Plan: Acute metabolic encephalopathy: Due to DKA-resolved-awake/alert-back to baseline.  DKA: Resolved.  DM-2 (A1c 12.2 on 03/05/2021): CBG stable-continue Levemir 15 units twice daily and SSI.  Recent Labs    01/21/22 2107 01/22/22 0816 01/22/22 1107  GLUCAP 142* 115* 201*    AKI on CKD stage IV: AKI hemodynamically mediated/osmotic diuresis in the setting of DKA-improved-creatinine back to baseline.  Acute on chronic HFrEF: Remains volume overloaded-continue torsemide-given 1 dose of metolazone today by cardiology.  A-fib with RVR: Maintaining sinus rhythm-continue amiodarone/Coreg-on Eliquis.  NSVT: On amiodarone-cardiology following-continue telemetry monitoring.  CAD-s/p RCA PCI-2000/2007: Continue aspirin/beta-blockers.  Elevated troponin: Due to demand ischemia.  HTN: BP stable-continue Coreg  Hypothyroidism: Continue Synthroid.  TSH stable.  Asymptomatic bacteriuria: No evidence of UTI (ruled out).  Empirically on antibiotics while in the ICU-currently on none.  Debility/deconditioning: Due to acute illness-insurance denied CIR-patient not keen on SNF-would likely go home with home health services  Pressure Ulcer: Pressure Injury 01/16/22 Buttocks Right Deep Tissue Pressure Injury - Purple or maroon localized area of discolored intact skin or blood-filled blister due to damage of underlying soft tissue from pressure and/or shear. Purple (Active)  01/16/22 2000  Location: Buttocks  Location Orientation:  Right  Staging: Deep Tissue Pressure Injury - Purple or maroon localized area of discolored intact skin or blood-filled blister due to damage of underlying soft tissue from pressure and/or  shear.  Wound Description (Comments): Purple  Present on Admission:   Dressing Type Foam - Lift dressing to assess site every shift 01/21/22 2130     Pressure Injury 01/16/22 Buttocks Left Deep Tissue Pressure Injury - Purple or maroon localized area of discolored intact skin or blood-filled blister due to damage of underlying soft tissue from pressure and/or shear. purple (Active)  01/16/22 2000  Location: Buttocks  Location Orientation: Left  Staging: Deep Tissue Pressure Injury - Purple or maroon localized area of discolored intact skin or blood-filled blister due to damage of underlying soft tissue from pressure and/or shear.  Wound Description (Comments): purple  Present on Admission:   Dressing Type Foam - Lift dressing to assess site every shift 01/21/22 2130    Morbid Obesity: Estimated body mass index is 39.5 kg/m as calculated from the following:   Height as of this encounter: '5\' 6"'$  (1.676 m).   Weight as of this encounter: 111 kg.   Code status:   Code Status: Full Code   DVT Prophylaxis: apixaban (ELIQUIS) tablet 5 mg    Family Communication: None at bedside   Disposition Plan: Status is: Inpatient Remains inpatient appropriate because: Severe acute illness-needs another 24-48 hours of inpatient optimization of volume status before consideration of discharge.   Planned Discharge Destination:Home health   Diet: Diet Order             Diet regular Room service appropriate? Yes; Fluid consistency: Thin  Diet effective now                     Antimicrobial agents: Anti-infectives (From admission, onward)    Start     Dose/Rate Route Frequency Ordered Stop   01/16/22 0000  meropenem (MERREM) 1 g in sodium chloride 0.9 % 100 mL IVPB  Status:  Discontinued        1 g 200 mL/hr over 30 Minutes Intravenous Every 24 hours 01/15/22 1035 01/15/22 1054   01/15/22 1200  meropenem (MERREM) 1 g in sodium chloride 0.9 % 100 mL IVPB        1 g 200 mL/hr over 30  Minutes Intravenous Every 12 hours 01/15/22 1054 01/18/22 1509   01/14/22 2330  meropenem (MERREM) 1 g in sodium chloride 0.9 % 100 mL IVPB        1 g 200 mL/hr over 30 Minutes Intravenous  Once 01/14/22 2231 01/15/22 0038   01/14/22 1815  cefTRIAXone (ROCEPHIN) 1 g in sodium chloride 0.9 % 100 mL IVPB  Status:  Discontinued        1 g 200 mL/hr over 30 Minutes Intravenous  Once 01/14/22 1805 01/14/22 2052        MEDICATIONS: Scheduled Meds:  amiodarone  200 mg Oral BID   apixaban  5 mg Oral BID   aspirin  81 mg Per NG tube Daily   carvedilol  6.25 mg Oral BID WC   insulin aspart  2-6 Units Subcutaneous TID WC   insulin detemir  15 Units Subcutaneous BID   levothyroxine  50 mcg Oral Q0600   potassium chloride  20 mEq Oral BID   torsemide  80 mg Oral BID   Continuous Infusions:  sodium chloride Stopped (01/19/22 1056)   PRN Meds:.sodium chloride, acetaminophen, dextrose, metoprolol tartrate, oxyCODONE, polyethylene glycol  I have personally reviewed following labs and imaging studies  LABORATORY DATA: CBC: Recent Labs  Lab 01/18/22 0109 01/19/22 0650 01/20/22 0325 01/21/22 0336 01/22/22 0919  WBC 7.7 6.8 6.9 6.9 8.5  HGB 10.0* 10.1* 10.3* 11.1* 11.5*  HCT 29.4* 29.5* 31.7* 34.4* 34.8*  MCV 78.8* 80.2 83.0 83.5 82.7  PLT 81* 76* 112* 160 272    Basic Metabolic Panel: Recent Labs  Lab 01/16/22 1345 01/16/22 1559 01/17/22 0108 01/18/22 1334 01/19/22 0650 01/20/22 0325 01/21/22 0336 01/22/22 0919  NA 122*   < > 130* 130* 131* 130* 132* 136  K >7.5*   < > 3.7 4.0 3.8 3.7 3.8 3.6  CL 99   < > 94* 97* 96* 97* 97* 99  CO2 21*   < > '24 23 25 25 23 23  '$ GLUCOSE 205*   < > 219* 208* 128* 95 135* 141*  BUN 87*   < > 90* 86* 88* 90* 95* 93*  CREATININE 3.66*   < > 3.68* 3.30* 3.29* 3.38* 3.39* 3.40*  CALCIUM 7.6*   < > 8.3* 8.2* 8.5* 8.3* 8.7* 8.6*  MG 1.4*  --  2.4  --   --   --   --   --   PHOS 3.6  --  4.0  --   --   --   --   --    < > = values in this  interval not displayed.    GFR: Estimated Creatinine Clearance: 18.9 mL/min (A) (by C-G formula based on SCr of 3.4 mg/dL (H)).  Liver Function Tests: No results for input(s): "AST", "ALT", "ALKPHOS", "BILITOT", "PROT", "ALBUMIN" in the last 168 hours. No results for input(s): "LIPASE", "AMYLASE" in the last 168 hours. No results for input(s): "AMMONIA" in the last 168 hours.  Coagulation Profile: No results for input(s): "INR", "PROTIME" in the last 168 hours.  Cardiac Enzymes: Recent Labs  Lab 01/16/22 1345  CKTOTAL 114    BNP (last 3 results) No results for input(s): "PROBNP" in the last 8760 hours.  Lipid Profile: No results for input(s): "CHOL", "HDL", "LDLCALC", "TRIG", "CHOLHDL", "LDLDIRECT" in the last 72 hours.  Thyroid Function Tests: No results for input(s): "TSH", "T4TOTAL", "FREET4", "T3FREE", "THYROIDAB" in the last 72 hours.  Anemia Panel: No results for input(s): "VITAMINB12", "FOLATE", "FERRITIN", "TIBC", "IRON", "RETICCTPCT" in the last 72 hours.  Urine analysis:    Component Value Date/Time   COLORURINE AMBER (A) 01/15/2022 0524   APPEARANCEUR CLOUDY (A) 01/15/2022 0524   LABSPEC 1.017 01/15/2022 0524   PHURINE 5.0 01/15/2022 0524   GLUCOSEU >=500 (A) 01/15/2022 0524   HGBUR MODERATE (A) 01/15/2022 0524   BILIRUBINUR NEGATIVE 01/15/2022 0524   BILIRUBINUR negative 06/05/2020 1334   KETONESUR 5 (A) 01/15/2022 0524   PROTEINUR 100 (A) 01/15/2022 0524   UROBILINOGEN 0.2 06/05/2020 1334   UROBILINOGEN 0.2 04/01/2014 1659   NITRITE NEGATIVE 01/15/2022 0524   LEUKOCYTESUR MODERATE (A) 01/15/2022 0524    Sepsis Labs: Lactic Acid, Venous    Component Value Date/Time   LATICACIDVEN 3.2 (HH) 01/15/2022 0132    MICROBIOLOGY: Recent Results (from the past 240 hour(s))  Blood Culture (routine x 2)     Status: None   Collection Time: 01/15/22  1:28 AM   Specimen: BLOOD RIGHT HAND  Result Value Ref Range Status   Specimen Description BLOOD RIGHT  HAND  Final   Special Requests   Final    BOTTLES DRAWN AEROBIC AND ANAEROBIC Blood Culture results may not be  optimal due to an inadequate volume of blood received in culture bottles   Culture   Final    NO GROWTH 5 DAYS Performed at [facility], [place] [address] [place], [place] [address]    Report Status 01/20/2022 FINAL  Final  Blood Culture (routine x 2)     Status: None   Collection Time: 01/15/22  1:28 AM   Specimen: BLOOD LEFT HAND  Result Value Ref Range Status   Specimen Description BLOOD LEFT HAND  Final   Special Requests   Final    BOTTLES DRAWN AEROBIC ONLY Blood Culture adequate volume   Culture   Final    NO GROWTH 5 DAYS Performed at [facility], [place] [address] [place], [place] [address]    Report Status 01/20/2022 FINAL  Final  Urine Culture     Status: Abnormal   Collection Time: 01/15/22  5:26 AM   Specimen: In/Out Cath Urine  Result Value Ref Range Status   Specimen Description IN/OUT CATH URINE  Final   Special Requests   Final    NONE Performed at [facility], [place] [address], [place], [place] [address]    Culture MULTIPLE SPECIES PRESENT, SUGGEST RECOLLECTION (A)  Final   Report Status 01/16/2022 FINAL  Final  MRSA Next Gen by PCR, Nasal     Status: None   Collection Time: 01/15/22  5:33 AM   Specimen: Nasal Mucosa; Nasal Swab  Result Value Ref Range Status   MRSA by PCR Next Gen NOT DETECTED NOT DETECTED Final    Comment: (NOTE) The GeneXpert MRSA Assay (FDA approved for NASAL specimens only), is one component of a comprehensive MRSA colonization surveillance program. It is not intended to diagnose MRSA infection nor to guide or monitor treatment for MRSA infections. Test performance is not FDA approved in patients less than 17 years old. Performed at [facility], [place] [address], [place], [place] [address]     RADIOLOGY STUDIES/RESULTS: No results found.   LOS: 8 days   Oren Binet, MD  [facility]    To contact the attending provider between 7A-7P or the covering provider during after hours 7P-7A, please log into the web site [email or web address] and access using universal [place] password for that web site. If you do not have the password, please call the hospital operator.  01/22/2022, 11:05 AM

## 2022-01-23 ENCOUNTER — Other Ambulatory Visit (HOSPITAL_COMMUNITY): Payer: Self-pay

## 2022-01-23 ENCOUNTER — Other Ambulatory Visit: Payer: Self-pay

## 2022-01-23 DIAGNOSIS — E1310 Other specified diabetes mellitus with ketoacidosis without coma: Secondary | ICD-10-CM | POA: Diagnosis not present

## 2022-01-23 DIAGNOSIS — I10 Essential (primary) hypertension: Secondary | ICD-10-CM | POA: Diagnosis not present

## 2022-01-23 DIAGNOSIS — I509 Heart failure, unspecified: Secondary | ICD-10-CM | POA: Diagnosis not present

## 2022-01-23 DIAGNOSIS — I2510 Atherosclerotic heart disease of native coronary artery without angina pectoris: Secondary | ICD-10-CM

## 2022-01-23 DIAGNOSIS — Z9861 Coronary angioplasty status: Secondary | ICD-10-CM

## 2022-01-23 LAB — GLUCOSE, CAPILLARY
Glucose-Capillary: 127 mg/dL — ABNORMAL HIGH (ref 70–99)
Glucose-Capillary: 116 mg/dL — ABNORMAL HIGH (ref 70–99)

## 2022-01-23 LAB — BASIC METABOLIC PANEL
Anion gap: 10 (ref 5–15)
BUN: 95 mg/dL — ABNORMAL HIGH (ref 8–23)
CO2: 26 mmol/L (ref 22–32)
Calcium: 8.7 mg/dL — ABNORMAL LOW (ref 8.9–10.3)
Chloride: 98 mmol/L (ref 98–111)
Creatinine, Ser: 3.37 mg/dL — ABNORMAL HIGH (ref 0.44–1.00)
GFR, Estimated: 14 mL/min — ABNORMAL LOW (ref >60)
Glucose, Bld: 175 mg/dL — ABNORMAL HIGH (ref 70–99)
Potassium: 4.1 mmol/L (ref 3.5–5.1)
Sodium: 134 mmol/L — ABNORMAL LOW (ref 135–145)

## 2022-01-23 LAB — HEMOGLOBIN A1C
Hgb A1c MFr Bld: >15.5 % — ABNORMAL HIGH (ref 4.8–5.6)
Mean Plasma Glucose: >398 mg/dL

## 2022-01-23 MED ORDER — BLOOD GLUCOSE METER KIT
PACK | 0 refills | Status: DC
  Filled 2022-01-23: qty 1, 30d supply, fill #0

## 2022-01-23 MED ORDER — APIXABAN 5 MG PO TABS
5.0000 mg | ORAL_TABLET | Freq: Two times a day (BID) | ORAL | 3 refills | Status: DC
  Filled 2022-01-23: qty 60, 30d supply, fill #0

## 2022-01-23 MED ORDER — AMIODARONE HCL 200 MG PO TABS
200.0000 mg | ORAL_TABLET | Freq: Every day | ORAL | Status: DC
  Administered 2022-01-23: 200 mg via ORAL
  Filled 2022-01-23: qty 1

## 2022-01-23 MED ORDER — MELATONIN 3 MG PO TABS
3.0000 mg | ORAL_TABLET | Freq: Every evening | ORAL | Status: DC | PRN
  Administered 2022-01-23: 3 mg via ORAL
  Filled 2022-01-23: qty 1

## 2022-01-23 MED ORDER — INSULIN DETEMIR 100 UNIT/ML FLEXPEN
15.0000 [IU] | PEN_INJECTOR | Freq: Two times a day (BID) | SUBCUTANEOUS | 11 refills | Status: DC
  Filled 2022-01-23: qty 15, 50d supply, fill #0

## 2022-01-23 MED ORDER — INSULIN ASPART 100 UNIT/ML FLEXPEN
PEN_INJECTOR | SUBCUTANEOUS | 11 refills | Status: DC
  Filled 2022-01-23: qty 9, 33d supply, fill #0

## 2022-01-23 MED ORDER — FINGERSTIX LANCETS MISC
0 refills | Status: DC
  Filled 2022-01-23: qty 100, 25d supply, fill #0

## 2022-01-23 MED ORDER — CARVEDILOL 6.25 MG PO TABS
6.2500 mg | ORAL_TABLET | Freq: Two times a day (BID) | ORAL | 2 refills | Status: DC
  Filled 2022-01-23: qty 60, 30d supply, fill #0

## 2022-01-23 MED ORDER — INSULIN PEN NEEDLE 32G X 4 MM MISC
1.0000 | 0 refills | Status: DC | PRN
Start: 2022-01-23 — End: 2022-12-12
  Filled 2022-01-23: qty 200, 40d supply, fill #0

## 2022-01-23 MED ORDER — GLUCOSE BLOOD VI STRP
ORAL_STRIP | 0 refills | Status: DC
  Filled 2022-01-23: qty 100, 25d supply, fill #0

## 2022-01-23 MED ORDER — AMIODARONE HCL 200 MG PO TABS
200.0000 mg | ORAL_TABLET | Freq: Every day | ORAL | 2 refills | Status: DC
  Filled 2022-01-23: qty 30, 30d supply, fill #0

## 2022-01-23 NOTE — Discharge Summary (Addendum)
PATIENT DETAILS Name: Susan [name] Age: 71 y.o. Sex: female Date of Birth: 1951-05-20 MRN: [id number]. Admitting Physician: Audria Nine, DO YKZ:[id number], [name], MD  Admit Date: 01/14/2022 Discharge date: 01/23/2022  Recommendations for Outpatient Follow-up:  Follow up with PCP in 1-2 weeks Please obtain CMP/CBC in one week Please ensure follow-up with cardiology. Please ensure follow-up with endocrinology-no longer on insulin pump-suspicion for malfunction.  Admitted From:  Home   Disposition: Home health (refused SNF-claims family members-husband will be home 24/7 for 2 weeks-son available most of the time as well)   Discharge Condition: fair  CODE STATUS:   Code Status: Full Code   Diet recommendation:  Diet Order             Diet regular Room service appropriate? No; Fluid consistency: Thin  Diet effective now           Diet - low sodium heart healthy                    Brief Summary: Patient is a 71 y.o.  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  Upon stability-patient was transferred to [facility] service.   Significant events: 8/22>> Admitted to ICU 8/24>> transitioned off Endotool, eeg triphasic consistent with encephalopathy 8/25>> taking PO, off D5 8/26>>transfer to [facility]     Significant studies: 8/22>> CXR: No PNA 8/22>> RUQ ultrasound: Distended gallbladder with sludge/stones.  Murphy sign is negative.  Left renal cortex appears slightly echogenic. 8/23>> CXR: No acute cardiopulmonary abnormality. 8/23>>CT Head>> no acute intracranial abnormality 8/25>> Echo: EF 77-93%, RV systolic function moderately reduced.   Significant microbiology data: 8/23>> urine culture: Multiple species 8/23>> blood culture: No growth    Procedures: None   Consults: PCCM, cardiology   Brief Hospital Course: Acute metabolic encephalopathy: Due to DKA-resolved-awake/alert-back to baseline.   DKA: Resolved.   DM-2 (A1c 12.2 on 03/05/2021): CBG stable-continue Levemir 15 units twice daily and SSI.  She previously was on an insulin pump-due to concern for malfunction-this is no longer being used.  She will be discharged on basal/bolus regimen-and follow-up with her primary endocrinologist for further optimization.  AKI on CKD stage IV: AKI hemodynamically mediated/osmotic diuresis in the setting of DKA-improved-creatinine back to baseline.   Acute on chronic HFrEF: Volume status has improved-continue torsemide/Coreg.  Ensure follow-up with cardiology.  Discussed with Dr. Laddie Aquas to discharge today.   A-fib with RVR: Maintaining sinus rhythm-continue amiodarone/Coreg-on Eliquis.  Discussed with cardiology-amiodarone dosing decreased to once daily dosing by cardiology today.   NSVT: On amiodarone-cardiology follow closely-no further recurrence on amiodarone.   CAD-s/p RCA PCI-2000/2007: Continue aspirin/beta-blockers.  No longer on Plavix as has been started on Eliquis.   Elevated troponin: Due to demand ischemia.   HTN: BP stable-continue Coreg   Hypothyroidism: Continue Synthroid.  TSH stable.   Asymptomatic bacteriuria: No evidence of UTI (ruled out).  Empirically on antibiotics while in the ICU-currently on none.   Debility/deconditioning: Due to acute illness-insurance denied CIR-patient not keen on SNF-would likely go home with home health services.  Patient continues to refuse SNF-claims she has significant help at home-husband will take off work for 2 weeks and will be at home 24//7-her son will be also available to assist most of the time.  Being discharged home at home request.  Pressure Ulcer: Pressure  Injury 01/16/22 Buttocks Right Deep Tissue Pressure Injury - Purple or maroon localized area of  discolored intact skin or blood-filled blister due to damage of underlying soft tissue from pressure and/or shear. Purple (Active)  01/16/22 2000  Location: Buttocks  Location Orientation: Right  Staging: Deep Tissue Pressure Injury - Purple or maroon localized area of discolored intact skin or blood-filled blister due to damage of underlying soft tissue from pressure and/or shear.  Wound Description (Comments): Purple  Present on Admission:   Dressing Type Foam - Lift dressing to assess site every shift 01/22/22 0800     Pressure Injury 01/16/22 Buttocks Left Deep Tissue Pressure Injury - Purple or maroon localized area of discolored intact skin or blood-filled blister due to damage of underlying soft tissue from pressure and/or shear. purple (Active)  01/16/22 2000  Location: Buttocks  Location Orientation: Left  Staging: Deep Tissue Pressure Injury - Purple or maroon localized area of discolored intact skin or blood-filled blister due to damage of underlying soft tissue from pressure and/or shear.  Wound Description (Comments): purple  Present on Admission:   Dressing Type Foam - Lift dressing to assess site every shift 01/21/22 2130   Morbid Obesity: Estimated body mass index is 39.5 kg/m as calculated from the following:   Height as of this encounter: _0  (1.676 m).   Weight as of this encounter: 111 kg.    Discharge Diagnoses:  Principal Problem:   DKA (diabetic ketoacidosis) ([place]) Active Problems:   Chronic HFrEF (heart failure with reduced ejection fraction) [facility])   Discharge Instructions:  Activity:  As tolerated with Full fall precautions use walker/cane & assistance as needed   Discharge Instructions     (HEART FAILURE PATIENTS) Call MD:  Anytime you have any of the following symptoms: 1) 3 pound weight gain in 24 hours or 5 pounds in 1 week 2) shortness of breath, with or without a dry hacking cough 3) swelling in the hands, feet or stomach 4) if you have to  sleep on extra pillows at night in order to breathe.   Complete by: As directed    Diet - low sodium heart healthy   Complete by: As directed    Increase activity slowly   Complete by: As directed    No wound care   Complete by: As directed       Allergies as of 01/23/2022       Reactions   Keflex [cephalexin] Diarrhea   Codeine Nausea And Vomiting, Other (See Comments)        Medication List     STOP taking these medications    clopidogrel 75 MG tablet Commonly known as: PLAVIX   insulin NPH Human 100 UNIT/ML injection Commonly known as: NOVOLIN N   insulin regular 100 units/mL injection Commonly known as: NOVOLIN R       TAKE these medications    albuterol 108 (90 Base) MCG/ACT inhaler Commonly known as: VENTOLIN HFA Inhale 1-2 puffs into the lungs every 6 (six) hours as needed for wheezing or shortness of breath.   amiodarone 200 MG tablet Commonly known as: PACERONE Take 1 tablet (200 mg total) by mouth daily.   apixaban 5 MG Tabs tablet Commonly known as: ELIQUIS Take 1 tablet (5 mg total) by mouth 2 (two) times daily.   aspirin EC 81 MG tablet Take 1 tablet (81 mg total) by mouth daily with breakfast.   atorvastatin 80 MG tablet Commonly known as: LIPITOR TAKE 1 TABLET BY MOUTH  EVERY DAY   carvedilol 6.25 MG tablet Commonly known as: COREG Take 1 tablet (6.25 mg total) by mouth 2 (two) times daily with a meal. What changed:  medication strength See the new instructions.   colchicine 0.6 MG tablet Take 0.6 mg by mouth daily as needed (gout).   diphenoxylate-atropine 2.5-0.025 MG tablet Commonly known as: Lomotil Take 1 tablet by mouth 4 (four) times daily as needed for diarrhea or loose stools.   ferrous sulfate 325 (65 FE) MG tablet Take 325 mg by mouth 2 (two) times daily.   FRUIT & VEGETABLE DAILY PO Take 1 tablet by mouth daily.   isosorbide mononitrate 30 MG 24 hr tablet Commonly known as: IMDUR Take 0.5 tablets (15 mg total) by  mouth daily.   Levemir FlexPen 100 UNIT/ML FlexPen Generic drug: insulin detemir Inject 15 Units into the skin 2 (two) times daily.   levothyroxine 50 MCG tablet Commonly known as: SYNTHROID Take 1 tablet (50 mcg total) by mouth daily before breakfast.   magnesium oxide 400 (240 Mg) MG tablet Commonly known as: MAG-OX Take 1 tablet (400 mg total) by mouth daily.   metolazone 2.5 MG tablet Commonly known as: ZAROXOLYN Take 1 tablet (2.5 mg total) by mouth as directed. Every other week   nitroGLYCERIN 0.4 MG SL tablet Commonly known as: NITROSTAT PLACE 1 TABLET (0.4 MG TOTAL) UNDER THE TONGUE EVERY 5 (FIVE) MINUTES AS NEEDED FOR CHEST PAIN.   NovoLOG FlexPen 100 UNIT/ML FlexPen Generic drug: insulin aspart 0-9 Units, Subcutaneous, 3 times daily with meals. CBG 70-120: 0 units CBG 121-150: 1 unit CBG 151-200: 2 units CBG 201-250: 3 units CBG 251-300: 5 units CBG 301-350: 7 units CBG 351-400: 9 units CBG>400: call MD   nystatin powder Commonly known as: MYCOSTATIN/NYSTOP Apply 1 Application topically 2 (two) times daily as needed for irritation.   nystatin cream Commonly known as: MYCOSTATIN Apply 1 Application topically 2 (two) times daily.   OneTouch Delica Plus [id number] Misc Use as directed to check blood sugars as need up to 4 times daily   OneTouch Verio Flex System w/Device Kit Use up to four times daily as directed.   OneTouch Verio test strip Generic drug: glucose blood use as directed to check blood sugars up to 4 times daily   OXYGEN Inhale 2 L/min into the lungs at bedtime.   Pentips 32G X 4 MM Misc Generic drug: Insulin Pen Needle Use as directed   polyethylene glycol 17 g packet Commonly known as: MIRALAX / GLYCOLAX Take 17 g by mouth as needed for mild constipation.   pregabalin 150 MG capsule Commonly known as: LYRICA Take 150 mg by mouth 2 (two) times daily.   sitaGLIPtin 25 MG tablet Commonly known as: Januvia Take 1 tablet (25 mg total) by  mouth daily.   torsemide 20 MG tablet Commonly known as: DEMADEX Take 4 tablets (80 mg total) by mouth 2 (two) times daily.   Trintellix 10 MG Tabs tablet Generic drug: vortioxetine HBr TAKE 1 TABLET BY MOUTH EVERY DAY What changed: how much to take   ursodiol 500 MG tablet Commonly known as: ACTIGALL Take 500 mg by mouth 3 (three) times daily.        Follow-up [place]. Follow up.   Why: for home health services Contact information: [place] [address] [phone]         [name], MD. Schedule an appointment  as soon as possible for a visit in 1 week(s).   Specialty: Family Medicine Contact information: [address] [place] [address] [phone]         Bensimhon, Shaune Pascal, MD. Schedule an appointment as soon as possible for a visit in 1 week(s).   Specialty: Cardiology Contact information: [address] [place] [address] [phone]         Jacelyn Pi, MD. Schedule an appointment as soon as possible for a visit in 1 week(s).   Specialty: Endocrinology Contact information: [address] [place] [address] [phone]                Allergies  Allergen Reactions   Keflex [Cephalexin] Diarrhea   Codeine Nausea And Vomiting and Other (See Comments)     Other Procedures/Studies: ECHOCARDIOGRAM LIMITED  Result Date: 01/17/2022    ECHOCARDIOGRAM LIMITED REPORT   Patient Name:   DAWNYA GRAMS Date of Exam: 01/17/2022 Medical Rec #:  [id number]    Height:       66.0 in Accession #:    [id number]   Weight:       244.7 lb Date of Birth:  05-11-1951    BSA:          2.179 m Patient Age:    71 years     BP:           121/77 mmHg Patient Gender: F            HR:           114 bpm. Exam Location:  Inpatient Procedure: Limited Echo Indications:    congestive heart failure  History:        Patient has prior history of  Echocardiogram examinations, most                 recent 12/10/2021. Arrythmias:RBBB; Risk Factors:Diabetes,                 Hypertension and Dyslipidemia.  Sonographer:    Johny Chess RDCS Referring Phys: [id number] Kipp Brood  Sonographer Comments: Patient is obese. IMPRESSIONS  1. Left ventricular ejection fraction, by estimation, is 30 to 35%. The left ventricle has moderately decreased function. The left ventricle demonstrates regional wall motion abnormalities (see scoring diagram/findings for description). Left ventricular  diastolic function could not be evaluated.  2. Right ventricular systolic function is moderately reduced. The right ventricular size is moderately enlarged. There is mildly elevated pulmonary artery systolic pressure.  3. The mitral valve is abnormal. Trivial mitral valve regurgitation. No evidence of mitral stenosis.  4. Tricuspid valve regurgitation is mild to moderate.  5. The aortic valve is normal in structure. Aortic valve regurgitation is not visualized. No aortic stenosis is present.  6. The inferior vena cava is normal in size with greater than 50% respiratory variability, suggesting right atrial pressure of 3 mmHg. FINDINGS  Left Ventricle: Left ventricular ejection fraction, by estimation, is 30 to 35%. The left ventricle has moderately decreased function. The left ventricle demonstrates regional wall motion abnormalities. The left ventricular internal cavity size was normal in size. There is no left ventricular hypertrophy. Left ventricular diastolic function could not be evaluated. Left ventricular diastolic function could not be evaluated due to nondiagnostic images.  LV Wall Scoring: The entire inferior wall and apex are akinetic. The entire anterior wall, entire lateral wall, and entire septum are hypokinetic. Right Ventricle: The right ventricular size is moderately enlarged. No  increase in right ventricular wall thickness. Right ventricular systolic function is  moderately reduced. There is mildly elevated pulmonary artery systolic pressure. The tricuspid regurgitant velocity is 3.16 m/s, and with an assumed right atrial pressure of 3 mmHg, the estimated right ventricular systolic pressure is 87.8 mmHg. Left Atrium: Left atrial size was normal in size. Right Atrium: Right atrial size was normal in size. Pericardium: There is no evidence of pericardial effusion. Mitral Valve: The mitral valve is abnormal. Trivial mitral valve regurgitation. No evidence of mitral valve stenosis. Tricuspid Valve: The tricuspid valve is normal in structure. Tricuspid valve regurgitation is mild to moderate. No evidence of tricuspid stenosis. Aortic Valve: The aortic valve is normal in structure. Aortic valve regurgitation is not visualized. No aortic stenosis is present. Pulmonic Valve: The pulmonic valve was normal in structure. Pulmonic valve regurgitation is not visualized. No evidence of pulmonic stenosis. Aorta: The aortic root is normal in size and structure. Venous: The inferior vena cava is normal in size with greater than 50% respiratory variability, suggesting right atrial pressure of 3 mmHg. IAS/Shunts: No atrial level shunt detected by color flow Doppler. LEFT VENTRICLE PLAX 2D LVIDd:         5.40 cm LVIDs:         4.50 cm LV PW:         1.00 cm LV IVS:        0.80 cm  IVC IVC diam: 1.70 cm LEFT ATRIUM         Index LA diam:    4.40 cm 2.02 cm/m   AORTA Ao Asc diam: 2.80 cm TRICUSPID VALVE TR Peak grad:   39.9 mmHg TR Vmax:        316.00 cm/s Skeet Latch MD Electronically signed by Skeet Latch MD Signature Date/Time: 01/17/2022/12:38:00 PM    Final    EEG adult  Result Date: 01/16/2022 Lora Havens, MD     01/16/2022 10:41 AM Patient Name: Pearley Millington MRN: [id number] Epilepsy Attending: Lora Havens Referring Physician/Provider: Christiana Fuchs, DO Date: 01/16/2022 Duration: 25.33 mins Patient history: 71yo F with ams. EEG to evaluate for seizure Level of  alertness: Awake AEDs during EEG study: None Technical aspects: This EEG study was done with scalp electrodes positioned according to the 10-20 International system of electrode placement. Electrical activity was reviewed with band pass filter of 1-_0 , sensitivity of 7 uV/mm, display speed of 43m/sec with a _1  notched filter applied as appropriate. EEG data were recorded continuously and digitally stored.  Video monitoring was available and reviewed as appropriate. Description: No posterior dominant rhythm was seen. EEG showed continuous generalized 3 to 5 Hz theta-delta slowing. Generalized periodic discharges with triphasic morphology at 1.5-2 Hz were also noted. Hyperventilation and photic stimulation were not performed.   ABNORMALITY - Periodic discharges with triphasic morphology, generalized ( GPDs) - Continuous slow, generalized IMPRESSION: This study showed generalized periodic discharges with triphasic morphology which can be on the ictal-interictal continuum.  However the frequency and morphology is more commonly suggestive of toxic-metabolic causes.  Additionally there is moderate to severe diffuse encephalopathy, nonspecific etiology.  If concern for ictal -interictal activity persists, long-term EEG can be considered.  Dr ALynetta Marewas notified. Priyanka OBarbra Sarks  CT HEAD WO CONTRAST (5MM)  Result Date: 01/15/2022 CLINICAL DATA:  Altered mental status, nontraumatic (Ped 0-17y) EXAM: CT HEAD WITHOUT CONTRAST TECHNIQUE: Contiguous axial images were obtained from the base of the skull through the vertex without intravenous contrast. RADIATION DOSE REDUCTION: This  exam was performed according to the departmental dose-optimization program which includes automated exposure control, adjustment of the mA and/or kV according to patient size and/or use of iterative reconstruction technique. COMPARISON:  None Available. FINDINGS: Brain: No evidence of acute infarction, hemorrhage, hydrocephalus,  extra-axial collection or mass lesion/mass effect. Chronic microvascular ischemic disease and cerebral atrophy. Vascular: No hyperdense vessel identified. Skull: No acute fracture. Sinuses/Orbits: Right maxillary sinus air-fluid level. Otherwise, clear sinuses. No acute orbital findings per Other: No mastoid effusions. IMPRESSION: No evidence of acute intracranial abnormality. Electronically Signed   By: Margaretha Sheffield M.D.   On: 01/15/2022 10:07   DG Chest Port 1 View  Result Date: 01/15/2022 CLINICAL DATA:  71 year old female with respiratory failure. EXAM: PORTABLE CHEST 1 VIEW COMPARISON:  Portable chest 01/14/2022 and earlier. FINDINGS: Portable AP semi upright view at 0537 hours. Stable somewhat low lung volumes. Stable mediastinal contours, borderline to mild cardiomegaly. Visualized tracheal air column is within normal limits. Allowing for portable technique the lungs are clear. No pneumothorax or pleural effusion. Paucity of bowel gas in the visible upper abdomen. No acute osseous abnormality identified. IMPRESSION: No acute cardiopulmonary abnormality. Electronically Signed   By: Genevie Ann M.D.   On: 01/15/2022 06:02   US Abdomen Complete  Result Date: 01/14/2022 CLINICAL DATA:  Abdomen pain EXAM: ABDOMEN ULTRASOUND COMPLETE COMPARISON:  CT 08/22/2020, ultrasound 08/14/2020 FINDINGS: Gallbladder: Distended with sludge and small stones. Increased wall thickness at 4.4 mm. Negative sonographic Murphy. Common bile duct: Diameter: 3.9 mm Liver: No focal lesion identified. Within normal limits in parenchymal echogenicity. Portal vein is patent on color Doppler imaging with normal direction of blood flow towards the liver. IVC: No abnormality visualized. Pancreas: Visualized portion unremarkable. Spleen: Upper normal in size Right Kidney: Length: 12.7 cm. Echogenicity normal. No hydronephrosis. Cyst at the lower pole measuring 2.3 cm, no follow-up imaging is recommended. Left Kidney: Length: 11.8 cm.  Cortex appears slightly echogenic. No mass or hydronephrosis. Abdominal aorta: No aneurysm visualized. Other findings: None. IMPRESSION: 1. Slightly distended gallbladder with sludge and stones. Gallbladder wall thickening is present but sonographic Percell Miller is negative; if clinical symptoms are suggestive of acute gallbladder disease, correlation with nuclear medicine hepatobiliary imaging could be obtained. 2. Left renal cortex appears slightly echogenic suggesting medical renal disease. No hydronephrosis. Electronically Signed   By: Donavan Foil M.D.   On: 01/14/2022 22:21   DG Chest Port 1 View  Result Date: 01/14/2022 CLINICAL DATA:  Chest pain EXAM: PORTABLE CHEST 1 VIEW COMPARISON:  Chest x-ray dated March 05, 2021 FINDINGS: Unchanged mild cardiomegaly. Right costophrenic angle is excluded from the field of view. Mild interstitial opacities. No focal consolidation. No large pleural effusion or evidence of pneumothorax. IMPRESSION: Similar mild interstitial opacities, likely due to pulmonary edema. Electronically Signed   By: Yetta Glassman M.D.   On: 01/14/2022 18:49     TODAY-DAY OF DISCHARGE:  Subjective:   Susan [name] today has no headache,no chest abdominal pain,no new weakness tingling or numbness, feels much better wants to go home today.   Objective:   Blood pressure (!) 125/57, pulse 71, temperature 97.8 F (36.6 C), temperature source Oral, resp. rate 18, height _0  (1.676 m), weight 111 kg, SpO2 95 %.  Intake/Output Summary (Last 24 hours) at 01/23/2022 1215 Last data filed at 01/22/2022 1700 Gross per 24 hour  Intake --  Output 700 ml  Net -700 ml   Filed Weights   01/15/22 0047 01/16/22 0400  Weight: 108.2 kg 111 kg  Exam: Awake Alert, Oriented *3, No new F.N deficits, Normal affect [place].AT,PERRAL Supple Neck,No JVD, No cervical lymphadenopathy appriciated.  Symmetrical Chest wall movement, Good air movement bilaterally, CTAB RRR,No Gallops,Rubs or new  Murmurs, No Parasternal Heave +ve B.Sounds, Abd Soft, Non tender, No organomegaly appriciated, No rebound -guarding or rigidity. No Cyanosis, Clubbing or edema, No new Rash or bruise   PERTINENT RADIOLOGIC STUDIES: No results found.   PERTINENT LAB RESULTS: CBC: Recent Labs    01/21/22 0336 01/22/22 0919  WBC 6.9 8.5  HGB 11.1* 11.5*  HCT 34.4* 34.8*  PLT 160 228   CMET CMP     Component Value Date/Time   NA 134 (L) 01/23/2022 0440   K 4.1 01/23/2022 0440   CL 98 01/23/2022 0440   CO2 26 01/23/2022 0440   GLUCOSE 175 (H) 01/23/2022 0440   GLUCOSE >444 (H) 06/07/2015 1311   BUN 95 (H) 01/23/2022 0440   CREATININE 3.37 (H) 01/23/2022 0440   CREATININE 3.08 (H) 08/05/2021 1648   CALCIUM 8.7 (L) 01/23/2022 0440   PROT 6.3 (L) 01/14/2022 1650   ALBUMIN 3.4 (L) 01/14/2022 1650   AST 28 01/14/2022 1650   ALT 24 01/14/2022 1650   ALKPHOS 103 01/14/2022 1650   BILITOT 1.9 (H) 01/14/2022 1650   GFRNONAA 14 (L) 01/23/2022 0440   GFRNONAA 11 (L) 11/15/2020 1452   GFRAA 13 (L) 11/15/2020 1452    GFR Estimated Creatinine Clearance: 19.1 mL/min (A) (by C-G formula based on SCr of 3.37 mg/dL (H)). No results for input(s): "LIPASE", "AMYLASE" in the last 72 hours. No results for input(s): "CKTOTAL", "CKMB", "CKMBINDEX", "TROPONINI" in the last 72 hours. Invalid input(s): "POCBNP" No results for input(s): "DDIMER" in the last 72 hours. No results for input(s): "HGBA1C" in the last 72 hours. No results for input(s): "CHOL", "HDL", "LDLCALC", "TRIG", "CHOLHDL", "LDLDIRECT" in the last 72 hours. No results for input(s): "TSH", "T4TOTAL", "T3FREE", "THYROIDAB" in the last 72 hours.  Invalid input(s): "FREET3" No results for input(s): "VITAMINB12", "FOLATE", "FERRITIN", "TIBC", "IRON", "RETICCTPCT" in the last 72 hours. Coags: No results for input(s): "INR" in the last 72 hours.  Invalid input(s): "PT" Microbiology: Recent Results (from the past 240 hour(s))  Blood Culture  (routine x 2)     Status: None   Collection Time: 01/15/22  1:28 AM   Specimen: BLOOD RIGHT HAND  Result Value Ref Range Status   Specimen Description BLOOD RIGHT HAND  Final   Special Requests   Final    BOTTLES DRAWN AEROBIC AND ANAEROBIC Blood Culture results may not be optimal due to an inadequate volume of blood received in culture bottles   Culture   Final    NO GROWTH 5 DAYS Performed at [facility], [place] [address] [place], [place] [address]    Report Status 01/20/2022 FINAL  Final  Blood Culture (routine x 2)     Status: None   Collection Time: 01/15/22  1:28 AM   Specimen: BLOOD LEFT HAND  Result Value Ref Range Status   Specimen Description BLOOD LEFT HAND  Final   Special Requests   Final    BOTTLES DRAWN AEROBIC ONLY Blood Culture adequate volume   Culture   Final    NO GROWTH 5 DAYS Performed at [facility], [place] [address] [place], [place] [address]    Report Status 01/20/2022 FINAL  Final  Urine Culture     Status: Abnormal   Collection Time: 01/15/22  5:26 AM   Specimen:  In/Out Cath Urine  Result Value Ref Range Status   Specimen Description IN/OUT CATH URINE  Final   Special Requests   Final    NONE Performed at [facility], [address] [place], [place] [address]    Culture MULTIPLE SPECIES PRESENT, SUGGEST RECOLLECTION (A)  Final   Report Status 01/16/2022 FINAL  Final  MRSA Next Gen by PCR, Nasal     Status: None   Collection Time: 01/15/22  5:33 AM   Specimen: Nasal Mucosa; Nasal Swab  Result Value Ref Range Status   MRSA by PCR Next Gen NOT DETECTED NOT DETECTED Final    Comment: (NOTE) The GeneXpert MRSA Assay (FDA approved for NASAL specimens only), is one component of a comprehensive MRSA colonization surveillance program. It is not intended to diagnose MRSA infection nor to guide or monitor treatment for MRSA infections. Test performance is not FDA approved in patients less than 31 years old. Performed at [facility], [place] [address], [place], [place] [address]     FURTHER DISCHARGE INSTRUCTIONS:  Get Medicines reviewed and adjusted: Please take all your medications with you for your next visit with your Primary MD  Laboratory/radiological data: Please request your Primary MD to go over all hospital tests and procedure/radiological results at the follow up, please ask your Primary MD to get all Hospital records sent to his/her office.  In some cases, they will be blood work, cultures and biopsy results pending at the time of your discharge. Please request that your primary care M.D. goes through all the records of your hospital data and follows up on these results.  Also Note the following: If you experience worsening of your admission symptoms, develop shortness of breath, life threatening emergency, suicidal or homicidal thoughts you must seek medical attention immediately by calling 911 or calling your MD immediately  if symptoms less severe.  You must read complete instructions/literature along with all the possible adverse reactions/side effects for all the Medicines you take and that have been prescribed to you. Take any new Medicines after you have completely understood and accpet all the possible adverse reactions/side effects.   Do not drive when taking Pain medications or sleeping medications (Benzodaizepines)  Do not take more than prescribed Pain, Sleep and Anxiety Medications. It is not advisable to combine anxiety,sleep and pain medications without talking with your primary care practitioner  Special Instructions: If you have smoked or chewed Tobacco  in the last 2 yrs please stop smoking, stop any regular Alcohol  and or any Recreational drug use.  Wear Seat belts while driving.  Please note: You were cared for by a hospitalist during your hospital stay. Once you are discharged, your primary care physician will handle any further medical issues. Please note that NO REFILLS for  any discharge medications will be authorized once you are discharged, as it is imperative that you return to your primary care physician (or establish a relationship with a primary care physician if you do not have one) for your post hospital discharge needs so that they can reassess your need for medications and monitor your lab values.  Total Time spent coordinating discharge including counseling, education and face to face time equals greater than 30 minutes.  SignedOren Binet 01/23/2022 12:15 PM

## 2022-01-23 NOTE — TOC Transition Note (Signed)
Transition of Care [facility]) - CM/SW Discharge Note   Patient Details  Name: Susan [name] MRN: [id number] Date of Birth: 04-11-1951  Transition of Care [facility]) CM/SW Contact:  Cyndi Bender, RN Phone Number: 01/23/2022, 12:38 PM   Clinical Narrative:    Patient stable for discharge.  Home health ordered. Amy with [facility] aware of discharge.  Orders for Home Health and DME. Spoke to patient regarding transition needs. Choice offered an patient defers to [facility] to find [facility] agency.  Patient's transportation at bedside. Address, Phone number and PCP verified.      Final next level of care: [place] Barriers to Discharge: Barriers Resolved   Patient Goals and CMS Choice Patient states their goals for this hospitalization and ongoing recovery are:: to go home CMS Medicare.gov Compare Post Acute Care list provided to:: Other (Comment Required) Choice offered to / list presented to : Spouse  Discharge Placement                 home      Discharge Plan and Services   Discharge Planning Services: CM Consult Post Acute Care Choice: Home Health                    HH Arranged: PT, OT [facility] Agency: [place] Date [place]: 01/23/22 Time [place]: 1234 Representative spoke with at [place]: Amy  Social Determinants of Health ([place]) Interventions     Readmission Risk Interventions    01/23/2022   12:38 PM 05/28/2020   12:50 PM 03/12/2020    2:48 PM  Readmission Risk Prevention Plan  Transportation Screening Complete Complete Complete  PCP or Specialist Appt within 3-5 Days Complete    HRI or [place] Complete    Social Work Consult for [place] Planning/Counseling Complete    Palliative Care Screening Not Applicable    Medication Review Press photographer) Complete Complete Complete  PCP or Specialist appointment within 3-5 days of discharge  Complete Complete  HRI or [place]  Complete Complete   SW Recovery Care/Counseling Consult  Complete Complete  Palliative Care Screening  Not Applicable Not [place]  Not Applicable Not Applicable

## 2022-01-23 NOTE — Patient Outreach (Signed)
[facility]) Care Management  01/23/2022  Susan [name] [birth year] [id number]  Updated noted:  Patient is [employer] C-SNP  Patient has a designated Care Management team in the HTA C-SNP .  Will sign off. Needs for post hospital is listed to be managed by another care management team.  Natividad Brood, RN BSN [facility] Liaison  [phone] business mobile phone Toll free office [phone]  Fax number: [phone] [email or web address]

## 2022-01-23 NOTE — Discharge Instructions (Signed)

## 2022-01-24 ENCOUNTER — Telehealth: Payer: Self-pay | Admitting: *Deleted

## 2022-01-24 NOTE — Patient Outreach (Signed)
  Care Coordination [facility] Note Transition Care Management Unsuccessful Follow-up Telephone Call  Date of discharge and from where:  [phone]. [facility]  Attempts:  2nd Attempt  Reason for unsuccessful TCM follow-up call:  No answer/busy  [place] Management [phone]

## 2022-01-24 NOTE — Patient Outreach (Signed)
  Care Coordination [facility] Note Transition Care Management Unsuccessful Follow-up Telephone Call  Date of discharge and from where:  [phone] [facility]  Attempts:  1st Attempt  Reason for unsuccessful TCM follow-up call:  No answer/busy  [place] Management [phone]

## 2022-01-28 ENCOUNTER — Telehealth: Payer: Self-pay | Admitting: *Deleted

## 2022-01-28 DIAGNOSIS — N184 Chronic kidney disease, stage 4 (severe): Secondary | ICD-10-CM | POA: Diagnosis not present

## 2022-01-28 DIAGNOSIS — E1122 Type 2 diabetes mellitus with diabetic chronic kidney disease: Secondary | ICD-10-CM | POA: Diagnosis not present

## 2022-01-28 DIAGNOSIS — I130 Hypertensive heart and chronic kidney disease with heart failure and stage 1 through stage 4 chronic kidney disease, or unspecified chronic kidney disease: Secondary | ICD-10-CM | POA: Diagnosis not present

## 2022-01-28 DIAGNOSIS — Z7984 Long term (current) use of oral hypoglycemic drugs: Secondary | ICD-10-CM | POA: Diagnosis not present

## 2022-01-28 DIAGNOSIS — I4891 Unspecified atrial fibrillation: Secondary | ICD-10-CM | POA: Diagnosis not present

## 2022-01-28 DIAGNOSIS — Z7901 Long term (current) use of anticoagulants: Secondary | ICD-10-CM | POA: Diagnosis not present

## 2022-01-28 DIAGNOSIS — I2510 Atherosclerotic heart disease of native coronary artery without angina pectoris: Secondary | ICD-10-CM | POA: Diagnosis not present

## 2022-01-28 DIAGNOSIS — Z794 Long term (current) use of insulin: Secondary | ICD-10-CM | POA: Diagnosis not present

## 2022-01-28 DIAGNOSIS — Z9981 Dependence on supplemental oxygen: Secondary | ICD-10-CM | POA: Diagnosis not present

## 2022-01-28 DIAGNOSIS — I5023 Acute on chronic systolic (congestive) heart failure: Secondary | ICD-10-CM | POA: Diagnosis not present

## 2022-01-28 NOTE — Patient Outreach (Signed)
  Care Coordination [facility] Note Transition Care Management Unsuccessful Follow-up Telephone Call  Date of discharge and from where:  [phone] [facility]  Attempts:  3rd Attempt  Reason for unsuccessful TCM follow-up call:  No answer/busy   [place] Management [phone]

## 2022-01-28 NOTE — Progress Notes (Signed)
Advanced Heart Failure Clinic Note   Primary Physician [name], MD Nephrology: Dr Hollie Salk HF Cardiologist: Glori Bickers, MD   Reason for Visit:  F/u for Chronic Diastolic Heart Failure  HPI:  Susan [name] is a 71 y.o. female with morbid obesity, RBBB, DM2, Hypothyroidism diastolic heart failure, CAD MI 2000/2007 BMS 2007, TIA, CKD Stage IV and PAD s/p R transmetatarsal amputation.   Has had frequent hospital admits since 2017.  Had home sleep study 07/25/20 and this was negative for OSA.   Admitted 3/22  with AKI and progressive volume overloaded in setting of recurrent UTI and probable ATN. She was placed on IV abx and IV lasix w/ improved renal function and volume status. SCr was down to 1.78 day of d/c. PT recommended SNF however pt declined. She was discharged home on 3/27 but readmitted 2 days later for urinary retention and recurrent AKI. Marland Kitchen  Admitted 10/22 with NSTEMI (hstrop 247). Echo with decline in EF to 30-35% and new inferior WMA. Suspect progression in RCA disease.Managed medically due to CKD IV (Cr ~3.5).  AHF consulted for management. GDMT limited by renal disease. Hospitalization c/b AKI and UTI. Discharge weight 268 lbs.  Follow up 12/22, stable NYHA IIIb, volume OK. Hydralazine stopped with marginal BP.  Today she returns for HF follow up with her husband. Overall feeling fine. Has been off metolazone x 1 month, ran out of med. Wears 2L oxygen at night. She has SOB walking further distances on flat ground, uses a walker around the house and [facility] when she is outside the home. Has a wound on RLE with some swelling and PCP arranged for unna wrap and is following. Denies palpitations, abnormal bleeding, CP, dizziness,  or PND/Orthopnea. Appetite ok. No fever or chills. Weight at home 252 pounds. Taking all medications. Saw Nephrology last week, Imdur was stopped. Took a nitro this past week for CP. Blood sugars have been "up and down."   Echo today (12/10/21),  results pending.    Cardiac Studies:  - Echo (10/22): EF 30-35% - Echo (9/21): EF 55-60%, RV ok - Echo (10/19): EF 55-60%, Grade I DD  - RHC (07/24/17): showed mild PAH with evidence of RV strain likely due to OHS/OSA.   RA = 17 RV = 48/15 PA = 49/11 (30) PCW = 18 Fick cardiac output/index = 7.0/3.0 PVR = 0.5 WU Ao sat = 97% PA sat = 62%, 64% Assessment: 1. Normal left-sided pressures 2. Mild PAH with evidence of RV strain likely due to OHS/OSA 3. Normal cardiac output  - Lexiscan w/ stress echo (2018): EF 68%, low risk  - LHC (2007): BMS distal RCA - Cardiolite (03/2002): EF of 51% but no ischemia - LHC (2000): BMS RCA   ROS: All systems reviewed and negative except as per HPI.   No outpatient medications have been marked as taking for the 01/29/22 encounter (Appointment) with [facility] PA/NP.   Allergies  Allergen Reactions   Keflex [Cephalexin] Diarrhea   Codeine Nausea And Vomiting and Other (See Comments)   Past Medical History:  Diagnosis Date   Acute MI ([place]) 1999; 2007   Anemia    hx   Anginal pain ([place])    Anxiety    ARF (acute renal failure) ([place]) 06/2017   [facility]   Arthritis    "generalized" (03/15/2014)   CAD (coronary artery disease)    MI in 2000 - MI  2007 - treated bare metal stent (no nuclear since then  as 9/11)   Carotid artery disease (HCC)    CHF (congestive heart failure) (HCC)    Chronic diastolic heart failure ([place])    a) ECHO (08/2013) EF 55-60% and RV function nl b) RHC (08/2013) RA 4, RV 30/5/7, PA 25/10 (16), PCWP 7, Fick CO/CI 6.3/2.7, PVR 1.5 WU, PA 61 and 66%   Daily headache    "~ every other day; since I fell in June" (03/15/2014)   Depression    Dyslipidemia    Dyspnea    Exertional shortness of breath    HTN (hypertension)    Hypothyroidism    Neuropathy    Obesity    Osteoarthritis    Peripheral neuropathy    PONV (postoperative nausea and vomiting)    RBBB (right bundle branch block)    Old   Stroke ([place])     mini strokes   Syncope    likely due to low blood sugar   Tachycardia    Sinus tachycardia   Type II diabetes mellitus (HCC)    Type II   Urinary incontinence    Venous insufficiency    Family History  Problem Relation Age of Onset   Heart attack Mother 28   Past Surgical History:  Procedure Laterality Date   ABDOMINAL HYSTERECTOMY  1980's   AMPUTATION Right 02/24/2018   Procedure: RIGHT FOOT GREAT TOE AND 2ND TOE AMPUTATION;  Surgeon: Newt Minion, MD;  Location: [place];  Service: Orthopedics;  Laterality: Right;   AMPUTATION Right 04/30/2018   Procedure: RIGHT TRANSMETATARSAL AMPUTATION;  Surgeon: Newt Minion, MD;  Location: [place];  Service: Orthopedics;  Laterality: Right;   BIOPSY  05/27/2020   Procedure: BIOPSY;  Surgeon: [name], DO;  [facility];  Service: Endoscopy;;   CATARACT EXTRACTION, BILATERAL Bilateral ?2013   COLONOSCOPY W/ POLYPECTOMY     COLONOSCOPY WITH PROPOFOL N/A 03/13/2019   Procedure: COLONOSCOPY WITH PROPOFOL;  Surgeon: Jerene Bears, MD;  Location: [place];  Service: Gastroenterology;  Laterality: N/A;   [place]; 2007   "1 + 1"   ESOPHAGOGASTRODUODENOSCOPY (EGD) WITH PROPOFOL N/A 03/13/2019   Procedure: ESOPHAGOGASTRODUODENOSCOPY (EGD) WITH PROPOFOL;  Surgeon: Jerene Bears, MD;  Location: [place];  Service: Gastroenterology;  Laterality: N/A;   ESOPHAGOGASTRODUODENOSCOPY (EGD) WITH PROPOFOL N/A 05/27/2020   Procedure: ESOPHAGOGASTRODUODENOSCOPY (EGD) WITH PROPOFOL;  Surgeon: [name], DO;  [facility];  Service: Endoscopy;  Laterality: N/A;   EYE SURGERY Bilateral    lazer   HEMOSTASIS CLIP PLACEMENT  03/13/2019   Procedure: HEMOSTASIS CLIP PLACEMENT;  Surgeon: Jerene Bears, MD;  Location: [facility];  Service: Gastroenterology;;   KNEE ARTHROSCOPY Left 10/25/2006   POLYPECTOMY  03/13/2019   Procedure: POLYPECTOMY;  Surgeon: Jerene Bears, MD;  Location:  [place];  Service: Gastroenterology;;   RIGHT HEART CATH N/A 07/24/2017   Procedure: RIGHT HEART CATH;  Surgeon: Jolaine Artist, MD;  Location: [place] [facility];  Service: Cardiovascular;  Laterality: N/A;   RIGHT HEART CATHETERIZATION N/A 09/22/2013   Procedure: RIGHT HEART CATH;  Surgeon: Jolaine Artist, MD;  Location: [facility];  Service: Cardiovascular;  Laterality: N/A;   SHOULDER ARTHROSCOPY WITH OPEN ROTATOR CUFF REPAIR Right 03/14/2014   Procedure: RIGHT SHOULDER ARTHROSCOPY WITH BICEPS RELEASE, OPEN SUBSCAPULA REPAIR, OPEN SUPRASPINATUS REPAIR.;  Surgeon: Meredith Pel, MD;  Location: [place];  Service: Orthopedics;  Laterality: Right;   TOE AMPUTATION Right 02/24/2018  GREAT TOE AND 2ND TOE AMPUTATION   TUBAL LIGATION  1970's   Social History   Socioeconomic History   Marital status: Married    Spouse name: Percell Miller   Number of children: 3   Years of education: 12th   Highest education level: Not on file  Occupational History    Employer: UNEMPLOYED  Tobacco Use   Smoking status: Former    Packs/day: 3.00    Years: 32.00    Total pack years: 96.00    Types: Cigarettes    Quit date: 10/24/1997    Years since quitting: 24.2   Smokeless tobacco: Never  Vaping Use   Vaping Use: Never used  Substance and Sexual Activity   Alcohol use: Not Currently    Comment: "might have 2-3 daiquiris in the summer"   Drug use: No   Sexual activity: Not Currently  Other Topics Concern   Not on file  Social History Narrative   Pt lives at home with her spouse.Caffeine Use- 3 sodas daily.   Social Determinants of Health   Financial Resource Strain: Low Risk  (08/16/2021)   Overall Financial Resource Strain (CARDIA)    Difficulty of Paying Living Expenses: Not hard at all  Food Insecurity: No Food Insecurity (08/16/2021)   Hunger Vital Sign    Worried About Running Out of Food in the Last Year: Never true    Ran Out of Food in the Last Year: Never true   Transportation Needs: No Transportation Needs (08/16/2021)   PRAPARE - Hydrologist (Medical): No    Lack of Transportation (Non-Medical): No  Physical Activity: Inactive (08/16/2021)   Exercise Vital Sign    Days of Exercise per Week: 0 days    Minutes of Exercise per Session: 0 min  Stress: No Stress Concern Present (08/16/2021)   [place]    Feeling of Stress : Not at all  Social Connections: Moderately Isolated (08/16/2021)   Social Connection and Isolation Panel [NHANES]    Frequency of Communication with Friends and Family: More than three times a week    Frequency of Social Gatherings with Friends and Family: More than three times a week    Attends Religious Services: Never    Marine scientist or Organizations: No    Attends Archivist Meetings: Never    Marital Status: Married  Human resources officer Violence: Not At Risk (08/16/2021)   Humiliation, Afraid, Rape, and Kick questionnaire    Fear of Current or Ex-Partner: No    Emotionally Abused: No    Physically Abused: No    Sexually Abused: No   Lipid Panel     Component Value Date/Time   CHOL 172 03/06/2021 0500   TRIG 150 (H) 03/06/2021 0500   HDL 51 03/06/2021 0500   CHOLHDL 3.4 03/06/2021 0500   VLDL 30 03/06/2021 0500   LDLCALC 91 03/06/2021 0500   LDLCALC 65 01/17/2019 1608   Current Outpatient Medications on File Prior to Visit  Medication Sig Dispense Refill   albuterol (VENTOLIN HFA) 108 (90 Base) MCG/ACT inhaler Inhale 1-2 puffs into the lungs every 6 (six) hours as needed for wheezing or shortness of breath.     amiodarone (PACERONE) 200 MG tablet Take 1 tablet (200 mg total) by mouth daily. 30 tablet 2   apixaban (ELIQUIS) 5 MG TABS tablet Take 1 tablet (5 mg total) by mouth 2 (two) times daily. 60 tablet 3  aspirin EC 81 MG tablet Take 1 tablet (81 mg total) by mouth daily with breakfast. (Patient not  taking: Reported on 01/16/2022) 30 tablet 11   atorvastatin (LIPITOR) 80 MG tablet TAKE 1 TABLET BY MOUTH EVERY DAY (Patient taking differently: Take 80 mg by mouth daily.) 90 tablet 1   blood glucose meter kit and supplies Use up to four times daily as directed. 1 each 0   carvedilol (COREG) 6.25 MG tablet Take 1 tablet (6.25 mg total) by mouth 2 (two) times daily with a meal. 60 tablet 2   colchicine 0.6 MG tablet Take 0.6 mg by mouth daily as needed (gout).     diphenoxylate-atropine (LOMOTIL) 2.5-0.025 MG tablet Take 1 tablet by mouth 4 (four) times daily as needed for diarrhea or loose stools. 30 tablet 0   ferrous sulfate 325 (65 FE) MG tablet Take 325 mg by mouth 2 (two) times daily.     Fingerstix Lancets MISC Use as directed to check blood sugars as need up to 4 times daily 100 each 0   glucose blood test strip use as directed to check blood sugars up to 4 times daily 100 each 0   insulin aspart (NOVOLOG) 100 UNIT/ML FlexPen 0-9 Units, Subcutaneous, 3 times daily with meals. CBG 70-120: 0 units CBG 121-150: 1 unit CBG 151-200: 2 units CBG 201-250: 3 units CBG 251-300: 5 units CBG 301-350: 7 units CBG 351-400: 9 units CBG>400: call MD 15 mL 11   insulin detemir (LEVEMIR) 100 UNIT/ML FlexPen Inject 15 Units into the skin 2 (two) times daily. 15 mL 11   Insulin Pen Needle 32G X 4 MM MISC Use as directed 200 each 0   isosorbide mononitrate (IMDUR) 30 MG 24 hr tablet Take 0.5 tablets (15 mg total) by mouth daily. 45 tablet 3   levothyroxine (SYNTHROID, LEVOTHROID) 50 MCG tablet Take 1 tablet (50 mcg total) by mouth daily before breakfast. 90 tablet 1   magnesium oxide (MAG-OX) 400 (240 Mg) MG tablet Take 1 tablet (400 mg total) by mouth daily. 90 tablet 3   metolazone (ZAROXOLYN) 2.5 MG tablet Take 1 tablet (2.5 mg total) by mouth as directed. Every other week 2 tablet 3   nitroGLYCERIN (NITROSTAT) 0.4 MG SL tablet PLACE 1 TABLET (0.4 MG TOTAL) UNDER THE TONGUE EVERY 5 (FIVE) MINUTES AS NEEDED FOR  CHEST PAIN. 25 tablet 1   Nutritional Supplements (FRUIT & VEGETABLE DAILY PO) Take 1 tablet by mouth daily.     nystatin (MYCOSTATIN/NYSTOP) powder Apply 1 Application topically 2 (two) times daily as needed for irritation.     nystatin cream (MYCOSTATIN) Apply 1 Application topically 2 (two) times daily.     OXYGEN Inhale 2 L/min into the lungs at bedtime.     polyethylene glycol (MIRALAX / GLYCOLAX) 17 g packet Take 17 g by mouth as needed for mild constipation.     pregabalin (LYRICA) 150 MG capsule Take 150 mg by mouth 2 (two) times daily.     sitaGLIPtin (JANUVIA) 25 MG tablet Take 1 tablet (25 mg total) by mouth daily. 90 tablet 3   torsemide (DEMADEX) 20 MG tablet Take 4 tablets (80 mg total) by mouth 2 (two) times daily. 240 tablet 3   TRINTELLIX 10 MG TABS tablet TAKE 1 TABLET BY MOUTH EVERY DAY (Patient taking differently: Take 10 mg by mouth daily.) 30 tablet 5   ursodiol (ACTIGALL) 500 MG tablet Take 500 mg by mouth 3 (three) times daily.     No  current facility-administered medications on file prior to visit.   Wt Readings from Last 3 Encounters:  01/16/22 111 kg (244 lb 11.4 oz)  12/20/21 108.4 kg (239 lb)  12/16/21 109.8 kg (242 lb)   There were no vitals taken for this visit.  PHYSICAL EXAM: General:  NAD. No resp difficulty, arrived in [facility] HEENT: Normal Neck: Supple. JVP 7-8. Carotids 2+ bilat; no bruits. No lymphadenopathy or thryomegaly appreciated. Cor: PMI nondisplaced. Regular rate & rhythm. No rubs, gallops or murmurs. Lungs: Clear Abdomen: Obese, nontender, nondistended. No hepatosplenomegaly. No bruits or masses. Good bowel sounds. Extremities: No cyanosis, clubbing, rash, 1-2+ LLE edema w/ unna wrap in place Neuro: Alert & oriented x 3, cranial nerves grossly intact. Moves all 4 extremities w/o difficulty. Affect pleasant.  ASSESSMENT AND PLAN:  CAD  - Known CAD w/ h/o remote MI in 2000 s/p BMS to RCA and again in 2007 w/ BMS to RCA  - Admitted 10/22 NSTEM  (hstrop 247) Echo with decline in EF to 30-35% and new inferior WMA. Suspect progression in RCA disease. No cath due CKD IV - Rare angina (stable).  - Restart Imdur 15 mg daily.  - Not candidate for cath with CKD IV unless ACS or persistent, severe angina. - Continue ASA + Plavix.  - Continue beta blocker. - Continue atorvastatin 80 mg daily.   2. Chronic Diastolic CHF--> Now with reduced EF 30-35%.  - Echo 9/21 EF 55-60%, RV normal  - Echo (10/22): EF down to 30-35% in setting of NSTEMI, Grade II DD. - Echo today (12/10/21), results pending.  - Stable NYHA IIIb, confounded by morbid obesity and physical deconditioning. Volume looks mildly up today. - Restart metolazone 2.5/20 KCL every other Wednesday (previously on weekly metolazone)  - Continue torsemide 80 mg bid. - Continue carvedilol 12.5 mg bid. - Restart Imdur 15 mg daily as above. - Off hydralazine with previously low BP. - No dig, SGLT2i, ARNI with CKD IV. - Again reinforced need for dietary discretion  - Limited options, discussed she will likely need HD for volume removal in the future.  - She had labs at Nephrology visit last week, will request records. - BMET in 2 weeks.   3. CKD IV - Baseline SCr ~3.5. - Followed by Dr. Hollie Salk.  - Will request office notes from visit last week. - Need to avoid hypotension.   4. Type 2DM, uncontrolled  - poorly controlled, last hgb A1c 12.2 in 10/22 - No SGLT2i.  5. HTN - Elevated today.  - Restart Imdur as above.  6. LLE wound - Diurese as above. - Continue unna wrap - Continue follow up with PCP  Follow up in 3-4 months with Dr. Haroldine Laws.   Rafael Bihari, FNP  4:15 PM

## 2022-01-29 ENCOUNTER — Other Ambulatory Visit (HOSPITAL_COMMUNITY): Payer: Self-pay | Admitting: *Deleted

## 2022-01-29 ENCOUNTER — Ambulatory Visit (HOSPITAL_BASED_OUTPATIENT_CLINIC_OR_DEPARTMENT_OTHER)
Admission: RE | Admit: 2022-01-29 | Discharge: 2022-01-29 | Disposition: A | Discharge status: Home / Self Care | Payer: HMO | Source: Ambulatory Visit | Attending: Family Medicine | Admitting: Family Medicine

## 2022-01-29 ENCOUNTER — Encounter (HOSPITAL_COMMUNITY): Payer: Self-pay

## 2022-01-29 VITALS — BP 128/72 | HR 76 | Wt 236.4 lb

## 2022-01-29 DIAGNOSIS — J9611 Chronic respiratory failure with hypoxia: Secondary | ICD-10-CM | POA: Diagnosis present

## 2022-01-29 DIAGNOSIS — Z7902 Long term (current) use of antithrombotics/antiplatelets: Secondary | ICD-10-CM | POA: Insufficient documentation

## 2022-01-29 DIAGNOSIS — E1122 Type 2 diabetes mellitus with diabetic chronic kidney disease: Secondary | ICD-10-CM | POA: Diagnosis present

## 2022-01-29 DIAGNOSIS — Z7984 Long term (current) use of oral hypoglycemic drugs: Secondary | ICD-10-CM | POA: Diagnosis not present

## 2022-01-29 DIAGNOSIS — Z794 Long term (current) use of insulin: Secondary | ICD-10-CM | POA: Diagnosis not present

## 2022-01-29 DIAGNOSIS — M7989 Other specified soft tissue disorders: Secondary | ICD-10-CM | POA: Diagnosis not present

## 2022-01-29 DIAGNOSIS — E1165 Type 2 diabetes mellitus with hyperglycemia: Secondary | ICD-10-CM | POA: Diagnosis present

## 2022-01-29 DIAGNOSIS — K8050 Calculus of bile duct without cholangitis or cholecystitis without obstruction: Secondary | ICD-10-CM | POA: Diagnosis not present

## 2022-01-29 DIAGNOSIS — I10 Essential (primary) hypertension: Secondary | ICD-10-CM

## 2022-01-29 DIAGNOSIS — E1151 Type 2 diabetes mellitus with diabetic peripheral angiopathy without gangrene: Secondary | ICD-10-CM | POA: Diagnosis not present

## 2022-01-29 DIAGNOSIS — A4181 Sepsis due to Enterococcus: Secondary | ICD-10-CM | POA: Diagnosis present

## 2022-01-29 DIAGNOSIS — N179 Acute kidney failure, unspecified: Secondary | ICD-10-CM | POA: Diagnosis not present

## 2022-01-29 DIAGNOSIS — I361 Nonrheumatic tricuspid (valve) insufficiency: Secondary | ICD-10-CM | POA: Diagnosis not present

## 2022-01-29 DIAGNOSIS — I4891 Unspecified atrial fibrillation: Secondary | ICD-10-CM

## 2022-01-29 DIAGNOSIS — N184 Chronic kidney disease, stage 4 (severe): Secondary | ICD-10-CM

## 2022-01-29 DIAGNOSIS — B952 Enterococcus as the cause of diseases classified elsewhere: Secondary | ICD-10-CM | POA: Diagnosis not present

## 2022-01-29 DIAGNOSIS — D631 Anemia in chronic kidney disease: Secondary | ICD-10-CM | POA: Diagnosis present

## 2022-01-29 DIAGNOSIS — A419 Sepsis, unspecified organism: Secondary | ICD-10-CM | POA: Diagnosis not present

## 2022-01-29 DIAGNOSIS — R112 Nausea with vomiting, unspecified: Secondary | ICD-10-CM | POA: Diagnosis not present

## 2022-01-29 DIAGNOSIS — I130 Hypertensive heart and chronic kidney disease with heart failure and stage 1 through stage 4 chronic kidney disease, or unspecified chronic kidney disease: Secondary | ICD-10-CM | POA: Diagnosis present

## 2022-01-29 DIAGNOSIS — L89323 Pressure ulcer of left buttock, stage 3: Secondary | ICD-10-CM | POA: Diagnosis present

## 2022-01-29 DIAGNOSIS — Z7901 Long term (current) use of anticoagulants: Secondary | ICD-10-CM | POA: Insufficient documentation

## 2022-01-29 DIAGNOSIS — I5082 Biventricular heart failure: Secondary | ICD-10-CM | POA: Diagnosis present

## 2022-01-29 DIAGNOSIS — I4892 Unspecified atrial flutter: Secondary | ICD-10-CM | POA: Insufficient documentation

## 2022-01-29 DIAGNOSIS — I110 Hypertensive heart disease with heart failure: Secondary | ICD-10-CM | POA: Diagnosis not present

## 2022-01-29 DIAGNOSIS — R7881 Bacteremia: Secondary | ICD-10-CM | POA: Diagnosis not present

## 2022-01-29 DIAGNOSIS — K8500 Idiopathic acute pancreatitis without necrosis or infection: Secondary | ICD-10-CM | POA: Diagnosis not present

## 2022-01-29 DIAGNOSIS — E782 Mixed hyperlipidemia: Secondary | ICD-10-CM | POA: Diagnosis not present

## 2022-01-29 DIAGNOSIS — R932 Abnormal findings on diagnostic imaging of liver and biliary tract: Secondary | ICD-10-CM | POA: Diagnosis not present

## 2022-01-29 DIAGNOSIS — R52 Pain, unspecified: Secondary | ICD-10-CM | POA: Diagnosis not present

## 2022-01-29 DIAGNOSIS — K851 Biliary acute pancreatitis without necrosis or infection: Secondary | ICD-10-CM | POA: Diagnosis present

## 2022-01-29 DIAGNOSIS — N390 Urinary tract infection, site not specified: Secondary | ICD-10-CM | POA: Diagnosis present

## 2022-01-29 DIAGNOSIS — E1142 Type 2 diabetes mellitus with diabetic polyneuropathy: Secondary | ICD-10-CM | POA: Diagnosis present

## 2022-01-29 DIAGNOSIS — K8020 Calculus of gallbladder without cholecystitis without obstruction: Secondary | ICD-10-CM | POA: Diagnosis not present

## 2022-01-29 DIAGNOSIS — Z6838 Body mass index (BMI) 38.0-38.9, adult: Secondary | ICD-10-CM

## 2022-01-29 DIAGNOSIS — Z79899 Other long term (current) drug therapy: Secondary | ICD-10-CM | POA: Insufficient documentation

## 2022-01-29 DIAGNOSIS — K859 Acute pancreatitis without necrosis or infection, unspecified: Secondary | ICD-10-CM | POA: Diagnosis present

## 2022-01-29 DIAGNOSIS — B379 Candidiasis, unspecified: Secondary | ICD-10-CM | POA: Diagnosis not present

## 2022-01-29 DIAGNOSIS — I5022 Chronic systolic (congestive) heart failure: Secondary | ICD-10-CM | POA: Diagnosis present

## 2022-01-29 DIAGNOSIS — R109 Unspecified abdominal pain: Secondary | ICD-10-CM | POA: Diagnosis not present

## 2022-01-29 DIAGNOSIS — E039 Hypothyroidism, unspecified: Secondary | ICD-10-CM | POA: Diagnosis present

## 2022-01-29 DIAGNOSIS — Z862 Personal history of diseases of the blood and blood-forming organs and certain disorders involving the immune mechanism: Secondary | ICD-10-CM | POA: Diagnosis not present

## 2022-01-29 DIAGNOSIS — I5023 Acute on chronic systolic (congestive) heart failure: Secondary | ICD-10-CM | POA: Diagnosis not present

## 2022-01-29 DIAGNOSIS — F32A Depression, unspecified: Secondary | ICD-10-CM | POA: Diagnosis present

## 2022-01-29 DIAGNOSIS — I252 Old myocardial infarction: Secondary | ICD-10-CM | POA: Insufficient documentation

## 2022-01-29 DIAGNOSIS — Z9641 Presence of insulin pump (external) (internal): Secondary | ICD-10-CM | POA: Insufficient documentation

## 2022-01-29 DIAGNOSIS — R1110 Vomiting, unspecified: Secondary | ICD-10-CM | POA: Diagnosis not present

## 2022-01-29 DIAGNOSIS — D7389 Other diseases of spleen: Secondary | ICD-10-CM | POA: Diagnosis not present

## 2022-01-29 DIAGNOSIS — I2510 Atherosclerotic heart disease of native coronary artery without angina pectoris: Secondary | ICD-10-CM | POA: Diagnosis not present

## 2022-01-29 DIAGNOSIS — Q453 Other congenital malformations of pancreas and pancreatic duct: Secondary | ICD-10-CM | POA: Diagnosis not present

## 2022-01-29 DIAGNOSIS — I509 Heart failure, unspecified: Secondary | ICD-10-CM | POA: Diagnosis not present

## 2022-01-29 DIAGNOSIS — L89326 Pressure-induced deep tissue damage of left buttock: Secondary | ICD-10-CM | POA: Diagnosis present

## 2022-01-29 DIAGNOSIS — N189 Chronic kidney disease, unspecified: Secondary | ICD-10-CM | POA: Diagnosis not present

## 2022-01-29 DIAGNOSIS — R6510 Systemic inflammatory response syndrome (SIRS) of non-infectious origin without acute organ dysfunction: Secondary | ICD-10-CM | POA: Diagnosis not present

## 2022-01-29 DIAGNOSIS — L89313 Pressure ulcer of right buttock, stage 3: Secondary | ICD-10-CM | POA: Diagnosis present

## 2022-01-29 DIAGNOSIS — R079 Chest pain, unspecified: Secondary | ICD-10-CM | POA: Diagnosis not present

## 2022-01-29 DIAGNOSIS — E8809 Other disorders of plasma-protein metabolism, not elsewhere classified: Secondary | ICD-10-CM | POA: Diagnosis present

## 2022-01-29 DIAGNOSIS — R7401 Elevation of levels of liver transaminase levels: Secondary | ICD-10-CM | POA: Diagnosis not present

## 2022-01-29 DIAGNOSIS — Z89431 Acquired absence of right foot: Secondary | ICD-10-CM | POA: Diagnosis not present

## 2022-01-29 DIAGNOSIS — R197 Diarrhea, unspecified: Secondary | ICD-10-CM | POA: Diagnosis not present

## 2022-01-29 DIAGNOSIS — N281 Cyst of kidney, acquired: Secondary | ICD-10-CM | POA: Diagnosis not present

## 2022-01-29 DIAGNOSIS — I088 Other rheumatic multiple valve diseases: Secondary | ICD-10-CM | POA: Diagnosis not present

## 2022-01-29 DIAGNOSIS — R1013 Epigastric pain: Secondary | ICD-10-CM | POA: Diagnosis not present

## 2022-01-29 DIAGNOSIS — Z87891 Personal history of nicotine dependence: Secondary | ICD-10-CM | POA: Diagnosis not present

## 2022-01-29 DIAGNOSIS — I071 Rheumatic tricuspid insufficiency: Secondary | ICD-10-CM | POA: Diagnosis present

## 2022-01-29 DIAGNOSIS — I480 Paroxysmal atrial fibrillation: Secondary | ICD-10-CM | POA: Diagnosis present

## 2022-01-29 DIAGNOSIS — K8070 Calculus of gallbladder and bile duct without cholecystitis without obstruction: Secondary | ICD-10-CM | POA: Diagnosis not present

## 2022-01-29 LAB — CBC
HCT: 33.1 % — ABNORMAL LOW (ref 36.0–46.0)
Hemoglobin: 10.6 g/dL — ABNORMAL LOW (ref 12.0–15.0)
MCH: 26.6 pg (ref 26.0–34.0)
MCHC: 32 g/dL (ref 30.0–36.0)
MCV: 83.2 fL (ref 80.0–100.0)
Platelets: 360 10*3/uL (ref 150–400)
RBC: 3.98 MIL/uL (ref 3.87–5.11)
RDW: 16.4 % — ABNORMAL HIGH (ref 11.5–15.5)
WBC: 11.6 10*3/uL — ABNORMAL HIGH (ref 4.0–10.5)
nRBC: 0 % (ref 0.0–0.2)

## 2022-01-29 LAB — BASIC METABOLIC PANEL
Anion gap: 14 (ref 5–15)
BUN: 89 mg/dL — ABNORMAL HIGH (ref 8–23)
CO2: 27 mmol/L (ref 22–32)
Calcium: 8.6 mg/dL — ABNORMAL LOW (ref 8.9–10.3)
Chloride: 92 mmol/L — ABNORMAL LOW (ref 98–111)
Creatinine, Ser: 3.08 mg/dL — ABNORMAL HIGH (ref 0.44–1.00)
GFR, Estimated: 16 mL/min — ABNORMAL LOW (ref >60)
Glucose, Bld: 246 mg/dL — ABNORMAL HIGH (ref 70–99)
Potassium: 3.6 mmol/L (ref 3.5–5.1)
Sodium: 133 mmol/L — ABNORMAL LOW (ref 135–145)

## 2022-01-29 LAB — BRAIN NATRIURETIC PEPTIDE: B Natriuretic Peptide: 891.1 pg/mL — ABNORMAL HIGH (ref 0.0–100.0)

## 2022-01-29 MED ORDER — APIXABAN 5 MG PO TABS: 5.0000 mg | ORAL_TABLET | Freq: Two times a day (BID) | ORAL | 3 refills | Status: DC

## 2022-01-29 MED ORDER — AMIODARONE HCL 200 MG PO TABS: 200.0000 mg | ORAL_TABLET | Freq: Every day | ORAL | 3 refills | Status: DC

## 2022-01-29 MED ORDER — CARVEDILOL 6.25 MG PO TABS: 6.2500 mg | ORAL_TABLET | Freq: Two times a day (BID) | ORAL | 3 refills | Status: DC

## 2022-01-29 NOTE — Patient Instructions (Addendum)
Left upper extremity venous ultrasound for swelling and pain - test scheduled Need follow up with Dr. Debbora Presto as soon as possible Refills sent for Eliquis, Amiodarone, and Coreg Will reach out to Pharmacy for assistance with your medications Return to see Dr. Haroldine Laws in 2 - 3 monts - appointment scheduled

## 2022-01-30 ENCOUNTER — Ambulatory Visit (HOSPITAL_BASED_OUTPATIENT_CLINIC_OR_DEPARTMENT_OTHER)
Admission: RE | Admit: 2022-01-30 | Discharge: 2022-01-30 | Disposition: A | Discharge status: Home / Self Care | Payer: HMO | Source: Ambulatory Visit | Attending: *Deleted | Admitting: *Deleted

## 2022-01-30 DIAGNOSIS — M7989 Other specified soft tissue disorders: Secondary | ICD-10-CM | POA: Insufficient documentation

## 2022-01-30 DIAGNOSIS — M79602 Pain in left arm: Secondary | ICD-10-CM | POA: Insufficient documentation

## 2022-01-30 DIAGNOSIS — R52 Pain, unspecified: Secondary | ICD-10-CM

## 2022-01-30 NOTE — Progress Notes (Signed)
Upper extremity venous duplex has been completed.   Preliminary results in CV Proc.   Susan [name] Cordarius Benning 01/30/2022 10:15 AM

## 2022-01-31 ENCOUNTER — Other Ambulatory Visit: Payer: Self-pay | Admitting: Family Medicine

## 2022-01-31 ENCOUNTER — Inpatient Hospital Stay (HOSPITAL_COMMUNITY)
Admission: EM | Admit: 2022-01-31 | Discharge: 2022-02-11 | DRG: 871 | Disposition: A | Discharge status: Home Health | Payer: HMO | Attending: Internal Medicine | Admitting: Internal Medicine

## 2022-01-31 ENCOUNTER — Encounter (HOSPITAL_COMMUNITY): Payer: Self-pay | Admitting: Emergency Medicine

## 2022-01-31 ENCOUNTER — Telehealth (HOSPITAL_COMMUNITY): Payer: Self-pay | Admitting: Surgery

## 2022-01-31 ENCOUNTER — Other Ambulatory Visit: Payer: Self-pay

## 2022-01-31 DIAGNOSIS — E785 Hyperlipidemia, unspecified: Secondary | ICD-10-CM | POA: Diagnosis present

## 2022-01-31 DIAGNOSIS — K219 Gastro-esophageal reflux disease without esophagitis: Secondary | ICD-10-CM | POA: Diagnosis present

## 2022-01-31 DIAGNOSIS — D631 Anemia in chronic kidney disease: Secondary | ICD-10-CM | POA: Diagnosis present

## 2022-01-31 DIAGNOSIS — I252 Old myocardial infarction: Secondary | ICD-10-CM

## 2022-01-31 DIAGNOSIS — Z6838 Body mass index (BMI) 38.0-38.9, adult: Secondary | ICD-10-CM

## 2022-01-31 DIAGNOSIS — I5082 Biventricular heart failure: Secondary | ICD-10-CM | POA: Diagnosis present

## 2022-01-31 DIAGNOSIS — L89316 Pressure-induced deep tissue damage of right buttock: Secondary | ICD-10-CM | POA: Diagnosis present

## 2022-01-31 DIAGNOSIS — E039 Hypothyroidism, unspecified: Secondary | ICD-10-CM | POA: Diagnosis present

## 2022-01-31 DIAGNOSIS — B351 Tinea unguium: Secondary | ICD-10-CM | POA: Diagnosis present

## 2022-01-31 DIAGNOSIS — N189 Chronic kidney disease, unspecified: Secondary | ICD-10-CM

## 2022-01-31 DIAGNOSIS — L89323 Pressure ulcer of left buttock, stage 3: Secondary | ICD-10-CM | POA: Diagnosis present

## 2022-01-31 DIAGNOSIS — Z881 Allergy status to other antibiotic agents status: Secondary | ICD-10-CM

## 2022-01-31 DIAGNOSIS — E1122 Type 2 diabetes mellitus with diabetic chronic kidney disease: Secondary | ICD-10-CM | POA: Diagnosis present

## 2022-01-31 DIAGNOSIS — K8070 Calculus of gallbladder and bile duct without cholecystitis without obstruction: Secondary | ICD-10-CM | POA: Diagnosis present

## 2022-01-31 DIAGNOSIS — L89313 Pressure ulcer of right buttock, stage 3: Secondary | ICD-10-CM | POA: Diagnosis present

## 2022-01-31 DIAGNOSIS — F32A Depression, unspecified: Secondary | ICD-10-CM | POA: Diagnosis present

## 2022-01-31 DIAGNOSIS — Z885 Allergy status to narcotic agent status: Secondary | ICD-10-CM

## 2022-01-31 DIAGNOSIS — R6510 Systemic inflammatory response syndrome (SIRS) of non-infectious origin without acute organ dysfunction: Secondary | ICD-10-CM | POA: Diagnosis present

## 2022-01-31 DIAGNOSIS — K851 Biliary acute pancreatitis without necrosis or infection: Secondary | ICD-10-CM | POA: Diagnosis present

## 2022-01-31 DIAGNOSIS — E8809 Other disorders of plasma-protein metabolism, not elsewhere classified: Secondary | ICD-10-CM | POA: Diagnosis present

## 2022-01-31 DIAGNOSIS — R7881 Bacteremia: Secondary | ICD-10-CM

## 2022-01-31 DIAGNOSIS — R7401 Elevation of levels of liver transaminase levels: Secondary | ICD-10-CM | POA: Diagnosis present

## 2022-01-31 DIAGNOSIS — N390 Urinary tract infection, site not specified: Secondary | ICD-10-CM | POA: Diagnosis present

## 2022-01-31 DIAGNOSIS — I5023 Acute on chronic systolic (congestive) heart failure: Secondary | ICD-10-CM | POA: Diagnosis present

## 2022-01-31 DIAGNOSIS — Z862 Personal history of diseases of the blood and blood-forming organs and certain disorders involving the immune mechanism: Secondary | ICD-10-CM

## 2022-01-31 DIAGNOSIS — Z79890 Hormone replacement therapy (postmenopausal): Secondary | ICD-10-CM

## 2022-01-31 DIAGNOSIS — I071 Rheumatic tricuspid insufficiency: Secondary | ICD-10-CM | POA: Diagnosis present

## 2022-01-31 DIAGNOSIS — Z7982 Long term (current) use of aspirin: Secondary | ICD-10-CM

## 2022-01-31 DIAGNOSIS — E1165 Type 2 diabetes mellitus with hyperglycemia: Secondary | ICD-10-CM | POA: Diagnosis present

## 2022-01-31 DIAGNOSIS — Z794 Long term (current) use of insulin: Secondary | ICD-10-CM

## 2022-01-31 DIAGNOSIS — K859 Acute pancreatitis without necrosis or infection, unspecified: Secondary | ICD-10-CM | POA: Diagnosis present

## 2022-01-31 DIAGNOSIS — Z7984 Long term (current) use of oral hypoglycemic drugs: Secondary | ICD-10-CM

## 2022-01-31 DIAGNOSIS — Z8673 Personal history of transient ischemic attack (TIA), and cerebral infarction without residual deficits: Secondary | ICD-10-CM

## 2022-01-31 DIAGNOSIS — I480 Paroxysmal atrial fibrillation: Secondary | ICD-10-CM | POA: Diagnosis present

## 2022-01-31 DIAGNOSIS — B3731 Acute candidiasis of vulva and vagina: Secondary | ICD-10-CM | POA: Diagnosis present

## 2022-01-31 DIAGNOSIS — Z79899 Other long term (current) drug therapy: Secondary | ICD-10-CM

## 2022-01-31 DIAGNOSIS — R109 Unspecified abdominal pain: Secondary | ICD-10-CM | POA: Diagnosis present

## 2022-01-31 DIAGNOSIS — A419 Sepsis, unspecified organism: Secondary | ICD-10-CM

## 2022-01-31 DIAGNOSIS — Z87891 Personal history of nicotine dependence: Secondary | ICD-10-CM

## 2022-01-31 DIAGNOSIS — R112 Nausea with vomiting, unspecified: Secondary | ICD-10-CM | POA: Diagnosis present

## 2022-01-31 DIAGNOSIS — L89326 Pressure-induced deep tissue damage of left buttock: Secondary | ICD-10-CM | POA: Diagnosis present

## 2022-01-31 DIAGNOSIS — Z8249 Family history of ischemic heart disease and other diseases of the circulatory system: Secondary | ICD-10-CM

## 2022-01-31 DIAGNOSIS — Z955 Presence of coronary angioplasty implant and graft: Secondary | ICD-10-CM

## 2022-01-31 DIAGNOSIS — Z7901 Long term (current) use of anticoagulants: Secondary | ICD-10-CM

## 2022-01-31 DIAGNOSIS — N184 Chronic kidney disease, stage 4 (severe): Secondary | ICD-10-CM | POA: Diagnosis present

## 2022-01-31 DIAGNOSIS — Z89431 Acquired absence of right foot: Secondary | ICD-10-CM

## 2022-01-31 DIAGNOSIS — E1142 Type 2 diabetes mellitus with diabetic polyneuropathy: Secondary | ICD-10-CM

## 2022-01-31 DIAGNOSIS — J9611 Chronic respiratory failure with hypoxia: Secondary | ICD-10-CM | POA: Diagnosis present

## 2022-01-31 DIAGNOSIS — I2510 Atherosclerotic heart disease of native coronary artery without angina pectoris: Secondary | ICD-10-CM | POA: Diagnosis present

## 2022-01-31 DIAGNOSIS — N179 Acute kidney failure, unspecified: Secondary | ICD-10-CM | POA: Diagnosis not present

## 2022-01-31 DIAGNOSIS — I4510 Unspecified right bundle-branch block: Secondary | ICD-10-CM | POA: Diagnosis present

## 2022-01-31 DIAGNOSIS — A4181 Sepsis due to Enterococcus: Principal | ICD-10-CM | POA: Diagnosis present

## 2022-01-31 DIAGNOSIS — I130 Hypertensive heart and chronic kidney disease with heart failure and stage 1 through stage 4 chronic kidney disease, or unspecified chronic kidney disease: Secondary | ICD-10-CM | POA: Diagnosis present

## 2022-01-31 DIAGNOSIS — I5022 Chronic systolic (congestive) heart failure: Secondary | ICD-10-CM | POA: Diagnosis present

## 2022-01-31 LAB — CBC
HCT: 35.9 % — ABNORMAL LOW (ref 36.0–46.0)
Hemoglobin: 11.5 g/dL — ABNORMAL LOW (ref 12.0–15.0)
MCH: 26.8 pg (ref 26.0–34.0)
MCHC: 32 g/dL (ref 30.0–36.0)
MCV: 83.7 fL (ref 80.0–100.0)
Platelets: 278 10*3/uL (ref 150–400)
RBC: 4.29 MIL/uL (ref 3.87–5.11)
RDW: 16.5 % — ABNORMAL HIGH (ref 11.5–15.5)
WBC: 20.1 10*3/uL — ABNORMAL HIGH (ref 4.0–10.5)
nRBC: 0 % (ref 0.0–0.2)

## 2022-01-31 LAB — HEMOGLOBIN A1C
Hgb A1c MFr Bld: >15.5 % — ABNORMAL HIGH (ref 4.8–5.6)
Mean Plasma Glucose: >398 mg/dL

## 2022-01-31 NOTE — ED Triage Notes (Signed)
Patient reports pain across her abdomen with emesis and diarrhea onset today , denies fever or chills .

## 2022-01-31 NOTE — Telephone Encounter (Signed)
-----   Message from Rafael Bihari, Home sent at 01/31/2022  1:46 PM EDT ----- No clot. Questionable irregularity in muscle fascia of left upper arm, may be related to recent hospitalization with IV infiltration.    Please follow up with PCP if pain persists.

## 2022-01-31 NOTE — ED Notes (Signed)
Patient arm trembling while blood pressure being taken

## 2022-01-31 NOTE — Telephone Encounter (Signed)
I called patient to review results per provider.  I left a message for a return call.

## 2022-02-01 ENCOUNTER — Encounter (HOSPITAL_COMMUNITY): Payer: Self-pay | Admitting: Emergency Medicine

## 2022-02-01 ENCOUNTER — Inpatient Hospital Stay (HOSPITAL_COMMUNITY): Payer: HMO

## 2022-02-01 DIAGNOSIS — I5022 Chronic systolic (congestive) heart failure: Secondary | ICD-10-CM | POA: Diagnosis present

## 2022-02-01 DIAGNOSIS — K8050 Calculus of bile duct without cholangitis or cholecystitis without obstruction: Secondary | ICD-10-CM | POA: Diagnosis not present

## 2022-02-01 DIAGNOSIS — F32A Depression, unspecified: Secondary | ICD-10-CM | POA: Diagnosis present

## 2022-02-01 DIAGNOSIS — E039 Hypothyroidism, unspecified: Secondary | ICD-10-CM | POA: Diagnosis present

## 2022-02-01 DIAGNOSIS — L89313 Pressure ulcer of right buttock, stage 3: Secondary | ICD-10-CM | POA: Diagnosis present

## 2022-02-01 DIAGNOSIS — I5023 Acute on chronic systolic (congestive) heart failure: Secondary | ICD-10-CM | POA: Diagnosis not present

## 2022-02-01 DIAGNOSIS — I509 Heart failure, unspecified: Secondary | ICD-10-CM | POA: Diagnosis not present

## 2022-02-01 DIAGNOSIS — N390 Urinary tract infection, site not specified: Secondary | ICD-10-CM | POA: Diagnosis present

## 2022-02-01 DIAGNOSIS — E1122 Type 2 diabetes mellitus with diabetic chronic kidney disease: Secondary | ICD-10-CM | POA: Diagnosis present

## 2022-02-01 DIAGNOSIS — A4181 Sepsis due to Enterococcus: Secondary | ICD-10-CM | POA: Diagnosis present

## 2022-02-01 DIAGNOSIS — R7401 Elevation of levels of liver transaminase levels: Secondary | ICD-10-CM | POA: Diagnosis present

## 2022-02-01 DIAGNOSIS — I361 Nonrheumatic tricuspid (valve) insufficiency: Secondary | ICD-10-CM | POA: Diagnosis not present

## 2022-02-01 DIAGNOSIS — I2510 Atherosclerotic heart disease of native coronary artery without angina pectoris: Secondary | ICD-10-CM | POA: Diagnosis not present

## 2022-02-01 DIAGNOSIS — I110 Hypertensive heart disease with heart failure: Secondary | ICD-10-CM | POA: Diagnosis not present

## 2022-02-01 DIAGNOSIS — E1165 Type 2 diabetes mellitus with hyperglycemia: Secondary | ICD-10-CM | POA: Diagnosis present

## 2022-02-01 DIAGNOSIS — K859 Acute pancreatitis without necrosis or infection, unspecified: Secondary | ICD-10-CM

## 2022-02-01 DIAGNOSIS — Z862 Personal history of diseases of the blood and blood-forming organs and certain disorders involving the immune mechanism: Secondary | ICD-10-CM

## 2022-02-01 DIAGNOSIS — I480 Paroxysmal atrial fibrillation: Secondary | ICD-10-CM | POA: Diagnosis present

## 2022-02-01 DIAGNOSIS — I071 Rheumatic tricuspid insufficiency: Secondary | ICD-10-CM | POA: Diagnosis present

## 2022-02-01 DIAGNOSIS — N179 Acute kidney failure, unspecified: Secondary | ICD-10-CM | POA: Diagnosis not present

## 2022-02-01 DIAGNOSIS — N189 Chronic kidney disease, unspecified: Secondary | ICD-10-CM | POA: Diagnosis not present

## 2022-02-01 DIAGNOSIS — R7881 Bacteremia: Secondary | ICD-10-CM | POA: Diagnosis not present

## 2022-02-01 DIAGNOSIS — J9611 Chronic respiratory failure with hypoxia: Secondary | ICD-10-CM | POA: Diagnosis present

## 2022-02-01 DIAGNOSIS — Z6838 Body mass index (BMI) 38.0-38.9, adult: Secondary | ICD-10-CM | POA: Diagnosis not present

## 2022-02-01 DIAGNOSIS — R109 Unspecified abdominal pain: Secondary | ICD-10-CM | POA: Diagnosis present

## 2022-02-01 DIAGNOSIS — I5082 Biventricular heart failure: Secondary | ICD-10-CM | POA: Diagnosis present

## 2022-02-01 DIAGNOSIS — N184 Chronic kidney disease, stage 4 (severe): Secondary | ICD-10-CM | POA: Diagnosis present

## 2022-02-01 DIAGNOSIS — Z89431 Acquired absence of right foot: Secondary | ICD-10-CM | POA: Diagnosis not present

## 2022-02-01 DIAGNOSIS — L89326 Pressure-induced deep tissue damage of left buttock: Secondary | ICD-10-CM | POA: Diagnosis present

## 2022-02-01 DIAGNOSIS — R6510 Systemic inflammatory response syndrome (SIRS) of non-infectious origin without acute organ dysfunction: Secondary | ICD-10-CM

## 2022-02-01 DIAGNOSIS — L89323 Pressure ulcer of left buttock, stage 3: Secondary | ICD-10-CM | POA: Diagnosis present

## 2022-02-01 DIAGNOSIS — D631 Anemia in chronic kidney disease: Secondary | ICD-10-CM | POA: Diagnosis present

## 2022-02-01 DIAGNOSIS — E1142 Type 2 diabetes mellitus with diabetic polyneuropathy: Secondary | ICD-10-CM | POA: Diagnosis present

## 2022-02-01 DIAGNOSIS — I130 Hypertensive heart and chronic kidney disease with heart failure and stage 1 through stage 4 chronic kidney disease, or unspecified chronic kidney disease: Secondary | ICD-10-CM | POA: Diagnosis present

## 2022-02-01 DIAGNOSIS — K851 Biliary acute pancreatitis without necrosis or infection: Secondary | ICD-10-CM | POA: Diagnosis present

## 2022-02-01 DIAGNOSIS — Z794 Long term (current) use of insulin: Secondary | ICD-10-CM | POA: Diagnosis not present

## 2022-02-01 DIAGNOSIS — Z87891 Personal history of nicotine dependence: Secondary | ICD-10-CM | POA: Diagnosis not present

## 2022-02-01 DIAGNOSIS — R1013 Epigastric pain: Secondary | ICD-10-CM | POA: Diagnosis not present

## 2022-02-01 DIAGNOSIS — R112 Nausea with vomiting, unspecified: Secondary | ICD-10-CM | POA: Diagnosis not present

## 2022-02-01 DIAGNOSIS — I088 Other rheumatic multiple valve diseases: Secondary | ICD-10-CM | POA: Diagnosis not present

## 2022-02-01 DIAGNOSIS — K8500 Idiopathic acute pancreatitis without necrosis or infection: Secondary | ICD-10-CM | POA: Diagnosis not present

## 2022-02-01 DIAGNOSIS — E8809 Other disorders of plasma-protein metabolism, not elsewhere classified: Secondary | ICD-10-CM | POA: Diagnosis present

## 2022-02-01 LAB — CBC WITH DIFFERENTIAL/PLATELET
Abs Immature Granulocytes: 0.21 10*3/uL — ABNORMAL HIGH (ref 0.00–0.07)
Basophils Absolute: 0.1 10*3/uL (ref 0.0–0.1)
Basophils Relative: 0 %
Eosinophils Absolute: 0 10*3/uL (ref 0.0–0.5)
Eosinophils Relative: 0 %
HCT: 29.5 % — ABNORMAL LOW (ref 36.0–46.0)
Hemoglobin: 9.7 g/dL — ABNORMAL LOW (ref 12.0–15.0)
Immature Granulocytes: 1 %
Lymphocytes Relative: 1 %
Lymphs Abs: 0.3 10*3/uL — ABNORMAL LOW (ref 0.7–4.0)
MCH: 27.7 pg (ref 26.0–34.0)
MCHC: 32.9 g/dL (ref 30.0–36.0)
MCV: 84.3 fL (ref 80.0–100.0)
Monocytes Absolute: 1 10*3/uL (ref 0.1–1.0)
Monocytes Relative: 4 %
Neutro Abs: 23.9 10*3/uL — ABNORMAL HIGH (ref 1.7–7.7)
Neutrophils Relative %: 94 %
Platelets: 226 10*3/uL (ref 150–400)
RBC: 3.5 MIL/uL — ABNORMAL LOW (ref 3.87–5.11)
RDW: 16.7 % — ABNORMAL HIGH (ref 11.5–15.5)
WBC: 25.5 10*3/uL — ABNORMAL HIGH (ref 4.0–10.5)
nRBC: 0 % (ref 0.0–0.2)

## 2022-02-01 LAB — MAGNESIUM: Magnesium: 1.8 mg/dL (ref 1.7–2.4)

## 2022-02-01 LAB — COMPREHENSIVE METABOLIC PANEL
ALT: 79 U/L — ABNORMAL HIGH (ref 0–44)
ALT: 61 U/L — ABNORMAL HIGH (ref 0–44)
ALT: 56 U/L — ABNORMAL HIGH (ref 0–44)
AST: 242 U/L — ABNORMAL HIGH (ref 15–41)
AST: 114 U/L — ABNORMAL HIGH (ref 15–41)
AST: 90 U/L — ABNORMAL HIGH (ref 15–41)
Albumin: 2.8 g/dL — ABNORMAL LOW (ref 3.5–5.0)
Albumin: 2.4 g/dL — ABNORMAL LOW (ref 3.5–5.0)
Albumin: 2.3 g/dL — ABNORMAL LOW (ref 3.5–5.0)
Alkaline Phosphatase: 404 U/L — ABNORMAL HIGH (ref 38–126)
Alkaline Phosphatase: 273 U/L — ABNORMAL HIGH (ref 38–126)
Alkaline Phosphatase: 300 U/L — ABNORMAL HIGH (ref 38–126)
Anion gap: 17 — ABNORMAL HIGH (ref 5–15)
Anion gap: 16 — ABNORMAL HIGH (ref 5–15)
Anion gap: 12 (ref 5–15)
BUN: 95 mg/dL — ABNORMAL HIGH (ref 8–23)
BUN: 94 mg/dL — ABNORMAL HIGH (ref 8–23)
BUN: 96 mg/dL — ABNORMAL HIGH (ref 8–23)
CO2: 25 mmol/L (ref 22–32)
CO2: 26 mmol/L (ref 22–32)
CO2: 30 mmol/L (ref 22–32)
Calcium: 9.1 mg/dL (ref 8.9–10.3)
Calcium: 8.5 mg/dL — ABNORMAL LOW (ref 8.9–10.3)
Calcium: 8.5 mg/dL — ABNORMAL LOW (ref 8.9–10.3)
Chloride: 93 mmol/L — ABNORMAL LOW (ref 98–111)
Chloride: 90 mmol/L — ABNORMAL LOW (ref 98–111)
Chloride: 93 mmol/L — ABNORMAL LOW (ref 98–111)
Creatinine, Ser: 3.69 mg/dL — ABNORMAL HIGH (ref 0.44–1.00)
Creatinine, Ser: 3.93 mg/dL — ABNORMAL HIGH (ref 0.44–1.00)
Creatinine, Ser: 4.03 mg/dL — ABNORMAL HIGH (ref 0.44–1.00)
GFR, Estimated: 12 mL/min — ABNORMAL LOW (ref >60)
GFR, Estimated: 12 mL/min — ABNORMAL LOW (ref >60)
GFR, Estimated: 11 mL/min — ABNORMAL LOW (ref >60)
Glucose, Bld: 287 mg/dL — ABNORMAL HIGH (ref 70–99)
Glucose, Bld: 246 mg/dL — ABNORMAL HIGH (ref 70–99)
Glucose, Bld: 136 mg/dL — ABNORMAL HIGH (ref 70–99)
Potassium: 4.5 mmol/L (ref 3.5–5.1)
Potassium: 3.9 mmol/L (ref 3.5–5.1)
Potassium: 4.2 mmol/L (ref 3.5–5.1)
Sodium: 135 mmol/L (ref 135–145)
Sodium: 132 mmol/L — ABNORMAL LOW (ref 135–145)
Sodium: 135 mmol/L (ref 135–145)
Total Bilirubin: 3.2 mg/dL — ABNORMAL HIGH (ref 0.3–1.2)
Total Bilirubin: 1 mg/dL (ref 0.3–1.2)
Total Bilirubin: 0.7 mg/dL (ref 0.3–1.2)
Total Protein: 6.7 g/dL (ref 6.5–8.1)
Total Protein: 5.4 g/dL — ABNORMAL LOW (ref 6.5–8.1)
Total Protein: 5.6 g/dL — ABNORMAL LOW (ref 6.5–8.1)

## 2022-02-01 LAB — URINALYSIS, ROUTINE W REFLEX MICROSCOPIC
Bilirubin Urine: NEGATIVE
Glucose, UA: 50 mg/dL — AB
Ketones, ur: NEGATIVE mg/dL
Leukocytes,Ua: NEGATIVE
Nitrite: NEGATIVE
Protein, ur: 30 mg/dL — AB
Specific Gravity, Urine: 1.012 (ref 1.005–1.030)
pH: 5 (ref 5.0–8.0)

## 2022-02-01 LAB — CBC
HCT: 29.5 % — ABNORMAL LOW (ref 36.0–46.0)
Hemoglobin: 9.5 g/dL — ABNORMAL LOW (ref 12.0–15.0)
MCH: 27.2 pg (ref 26.0–34.0)
MCHC: 32.2 g/dL (ref 30.0–36.0)
MCV: 84.5 fL (ref 80.0–100.0)
Platelets: 202 10*3/uL (ref 150–400)
RBC: 3.49 MIL/uL — ABNORMAL LOW (ref 3.87–5.11)
RDW: 16.9 % — ABNORMAL HIGH (ref 11.5–15.5)
WBC: 18.5 10*3/uL — ABNORMAL HIGH (ref 4.0–10.5)
nRBC: 0 % (ref 0.0–0.2)

## 2022-02-01 LAB — GAMMA GT: GGT: 144 U/L — ABNORMAL HIGH (ref 7–50)

## 2022-02-01 LAB — BILIRUBIN, DIRECT: Bilirubin, Direct: 0.4 mg/dL — ABNORMAL HIGH (ref 0.0–0.2)

## 2022-02-01 LAB — PROTIME-INR
INR: 1.5 — ABNORMAL HIGH (ref 0.8–1.2)
Prothrombin Time: 17.9 seconds — ABNORMAL HIGH (ref 11.4–15.2)

## 2022-02-01 LAB — TSH: TSH: 1.493 u[IU]/mL (ref 0.350–4.500)

## 2022-02-01 LAB — LIPID PANEL
Cholesterol: 92 mg/dL (ref 0–200)
HDL: 34 mg/dL — ABNORMAL LOW (ref >40)
LDL Cholesterol: 29 mg/dL (ref 0–99)
Total CHOL/HDL Ratio: 2.7 RATIO
Triglycerides: 143 mg/dL (ref <150)
VLDL: 29 mg/dL (ref 0–40)

## 2022-02-01 LAB — LIPASE, BLOOD
Lipase: 1341 U/L — ABNORMAL HIGH (ref 11–51)
Lipase: 112 U/L — ABNORMAL HIGH (ref 11–51)

## 2022-02-01 LAB — CBG MONITORING, ED
Glucose-Capillary: 246 mg/dL — ABNORMAL HIGH (ref 70–99)
Glucose-Capillary: 147 mg/dL — ABNORMAL HIGH (ref 70–99)

## 2022-02-01 LAB — ETHANOL: Alcohol, Ethyl (B): <10 mg/dL (ref <10)

## 2022-02-01 LAB — GLUCOSE, CAPILLARY: Glucose-Capillary: 172 mg/dL — ABNORMAL HIGH (ref 70–99)

## 2022-02-01 LAB — LACTATE DEHYDROGENASE: LDH: 283 U/L — ABNORMAL HIGH (ref 98–192)

## 2022-02-01 LAB — BRAIN NATRIURETIC PEPTIDE: B Natriuretic Peptide: 451.5 pg/mL — ABNORMAL HIGH (ref 0.0–100.0)

## 2022-02-01 LAB — PROCALCITONIN: Procalcitonin: 23.07 ng/mL

## 2022-02-01 MED ORDER — NALOXONE HCL 0.4 MG/ML IJ SOLN: 0.4000 mg | INTRAMUSCULAR | Status: DC | PRN

## 2022-02-01 MED ORDER — SODIUM CHLORIDE 0.9 % IV SOLN: INTRAVENOUS | Status: AC

## 2022-02-01 MED ORDER — LACTATED RINGERS IV BOLUS
1000.0000 mL | Freq: Once | INTRAVENOUS | Status: AC
  Administered 2022-02-01: 1000 mL via INTRAVENOUS

## 2022-02-01 MED ORDER — ACETAMINOPHEN 650 MG RE SUPP: 650.0000 mg | Freq: Four times a day (QID) | RECTAL | Status: DC | PRN

## 2022-02-01 MED ORDER — LORAZEPAM 2 MG/ML IJ SOLN
0.5000 mg | INTRAMUSCULAR | Status: DC | PRN
  Administered 2022-02-02 – 2022-02-07 (×2): 0.5 mg via INTRAVENOUS
  Filled 2022-02-01 (×2): qty 1

## 2022-02-01 MED ORDER — LEVOTHYROXINE SODIUM 50 MCG PO TABS
50.0000 ug | ORAL_TABLET | Freq: Every day | ORAL | Status: DC
  Administered 2022-02-02 – 2022-02-11 (×10): 50 ug via ORAL
  Filled 2022-02-01 (×10): qty 1

## 2022-02-01 MED ORDER — INSULIN DETEMIR 100 UNIT/ML ~~LOC~~ SOLN
7.0000 [IU] | Freq: Two times a day (BID) | SUBCUTANEOUS | Status: DC
Start: 2022-02-01 — End: 2022-02-04
  Administered 2022-02-01 – 2022-02-04 (×7): 7 [IU] via SUBCUTANEOUS
  Filled 2022-02-01 (×9): qty 0.07

## 2022-02-01 MED ORDER — ACETAMINOPHEN 325 MG PO TABS: 650.0000 mg | ORAL_TABLET | Freq: Four times a day (QID) | ORAL | Status: DC | PRN

## 2022-02-01 MED ORDER — FENTANYL CITRATE PF 50 MCG/ML IJ SOSY
50.0000 ug | PREFILLED_SYRINGE | INTRAMUSCULAR | Status: DC | PRN
  Administered 2022-02-04: 50 ug via INTRAVENOUS
  Filled 2022-02-01: qty 1

## 2022-02-01 MED ORDER — LORAZEPAM 2 MG/ML IJ SOLN
0.5000 mg | INTRAMUSCULAR | Status: AC | PRN
Start: 2022-02-01 — End: 2022-02-01
  Administered 2022-02-01: 0.5 mg via INTRAVENOUS
  Filled 2022-02-01: qty 1

## 2022-02-01 MED ORDER — CARVEDILOL 6.25 MG PO TABS
6.2500 mg | ORAL_TABLET | Freq: Two times a day (BID) | ORAL | Status: DC
  Administered 2022-02-01 – 2022-02-11 (×21): 6.25 mg via ORAL
  Filled 2022-02-01 (×21): qty 1

## 2022-02-01 MED ORDER — INSULIN ASPART 100 UNIT/ML IJ SOLN
0.0000 [IU] | Freq: Four times a day (QID) | INTRAMUSCULAR | Status: DC
  Administered 2022-02-01: 1 [IU] via SUBCUTANEOUS
  Administered 2022-02-01: 2 [IU] via SUBCUTANEOUS
  Administered 2022-02-01 – 2022-02-02 (×2): 3 [IU] via SUBCUTANEOUS
  Administered 2022-02-02: 7 [IU] via SUBCUTANEOUS
  Administered 2022-02-02: 3 [IU] via SUBCUTANEOUS
  Administered 2022-02-02: 9 [IU] via SUBCUTANEOUS
  Administered 2022-02-03: 2 [IU] via SUBCUTANEOUS
  Administered 2022-02-03 (×2): 3 [IU] via SUBCUTANEOUS
  Administered 2022-02-03: 2 [IU] via SUBCUTANEOUS
  Administered 2022-02-04: 5 [IU] via SUBCUTANEOUS
  Administered 2022-02-04 (×2): 9 [IU] via SUBCUTANEOUS
  Administered 2022-02-04: 7 [IU] via SUBCUTANEOUS
  Administered 2022-02-05 (×2): 9 [IU] via SUBCUTANEOUS
  Administered 2022-02-05 (×2): 5 [IU] via SUBCUTANEOUS
  Administered 2022-02-06: 7 [IU] via SUBCUTANEOUS
  Administered 2022-02-06 (×2): 5 [IU] via SUBCUTANEOUS
  Administered 2022-02-06: 1 [IU] via SUBCUTANEOUS
  Administered 2022-02-06: 2 [IU] via SUBCUTANEOUS
  Administered 2022-02-07: 1 [IU] via SUBCUTANEOUS
  Administered 2022-02-07 – 2022-02-08 (×2): 5 [IU] via SUBCUTANEOUS
  Administered 2022-02-08: 11 [IU] via SUBCUTANEOUS
  Administered 2022-02-08: 2 [IU] via SUBCUTANEOUS
  Filled 2022-02-01: qty 1

## 2022-02-01 MED ORDER — ONDANSETRON HCL 4 MG/2ML IJ SOLN
4.0000 mg | Freq: Once | INTRAMUSCULAR | Status: AC
  Administered 2022-02-01: 4 mg via INTRAVENOUS
  Filled 2022-02-01: qty 2

## 2022-02-01 MED ORDER — LACTATED RINGERS IV SOLN: INTRAVENOUS | Status: DC

## 2022-02-01 MED ORDER — FENTANYL CITRATE PF 50 MCG/ML IJ SOSY
50.0000 ug | PREFILLED_SYRINGE | Freq: Once | INTRAMUSCULAR | Status: AC
  Administered 2022-02-01: 50 ug via INTRAVENOUS
  Filled 2022-02-01: qty 1

## 2022-02-01 MED ORDER — SODIUM CHLORIDE 0.9 % IV SOLN
2.0000 g | Freq: Two times a day (BID) | INTRAVENOUS | Status: DC
  Administered 2022-02-01 – 2022-02-02 (×2): 2 g via INTRAVENOUS
  Filled 2022-02-01 (×3): qty 2000

## 2022-02-01 MED ORDER — AMIODARONE HCL 200 MG PO TABS
200.0000 mg | ORAL_TABLET | Freq: Every day | ORAL | Status: DC
  Administered 2022-02-01 – 2022-02-11 (×11): 200 mg via ORAL
  Filled 2022-02-01 (×11): qty 1

## 2022-02-01 NOTE — Progress Notes (Signed)
PHARMACY - PHYSICIAN COMMUNICATION CRITICAL VALUE ALERT - BLOOD CULTURE IDENTIFICATION (BCID)  Susan [name] is an 71 y.o. female who presented to [facility] on 01/31/2022 with a chief complaint of acute pancreatitis  Assessment:  Bcx 1/4 (anaerobic bottle) growing E. Faecalis and E Faecium with no resistance. WBC 18.5 and down trending, afebrile, low blood pressure.   Name of physician (or Provider) Contacted: Dr. Nevada Crane   Current antibiotics: none  Changes to prescribed antibiotics recommended:  Start ampicillin IV. Recommendations accepted by provider  Results for orders placed or performed during the hospital encounter of 01/31/22  Blood Culture ID Panel (Reflexed) (Collected: 02/01/2022  5:00 AM)  Result Value Ref Range   Enterococcus faecalis DETECTED (A) NOT DETECTED   Enterococcus Faecium DETECTED (A) NOT DETECTED   Listeria monocytogenes NOT DETECTED NOT DETECTED   Staphylococcus species NOT DETECTED NOT DETECTED   Staphylococcus aureus (BCID) NOT DETECTED NOT DETECTED   Staphylococcus epidermidis NOT DETECTED NOT DETECTED   Staphylococcus lugdunensis NOT DETECTED NOT DETECTED   Streptococcus species NOT DETECTED NOT DETECTED   Streptococcus agalactiae NOT DETECTED NOT DETECTED   Streptococcus pneumoniae NOT DETECTED NOT DETECTED   Streptococcus pyogenes NOT DETECTED NOT DETECTED   A.calcoaceticus-baumannii NOT DETECTED NOT DETECTED   Bacteroides fragilis NOT DETECTED NOT DETECTED   Enterobacterales NOT DETECTED NOT DETECTED   Enterobacter cloacae complex NOT DETECTED NOT DETECTED   Escherichia coli NOT DETECTED NOT DETECTED   Klebsiella aerogenes NOT DETECTED NOT DETECTED   Klebsiella oxytoca NOT DETECTED NOT DETECTED   Klebsiella pneumoniae NOT DETECTED NOT DETECTED   Proteus species NOT DETECTED NOT DETECTED   Salmonella species NOT DETECTED NOT DETECTED   Serratia marcescens NOT DETECTED NOT DETECTED   Haemophilus influenzae NOT DETECTED NOT DETECTED   Neisseria  meningitidis NOT DETECTED NOT DETECTED   Pseudomonas aeruginosa NOT DETECTED NOT DETECTED   Stenotrophomonas maltophilia NOT DETECTED NOT DETECTED   Candida albicans NOT DETECTED NOT DETECTED   Candida auris NOT DETECTED NOT DETECTED   Candida glabrata NOT DETECTED NOT DETECTED   Candida krusei NOT DETECTED NOT DETECTED   Candida parapsilosis NOT DETECTED NOT DETECTED   Candida tropicalis NOT DETECTED NOT DETECTED   Cryptococcus neoformans/gattii NOT DETECTED NOT DETECTED   Vancomycin resistance NOT DETECTED NOT DETECTED    Benetta Spar, PharmD, BCPS, BCCP Clinical Pharmacist  Please check AMION for all [place] phone numbers After 10:00 PM, call [place] [phone]

## 2022-02-01 NOTE — Progress Notes (Signed)
  Progress Note   Patient: Susan [name] FBP:[id number] DOB: 1950/12/21 DOA: 01/31/2022     0 DOS: the patient was seen and examined on 02/01/2022   Brief hospital course: 81UM with hx systolic chf, DM, CKD 4, afib on eliquis presented with epigastric pain with n/v admitted for acute pancreatitis.  Assessment and Plan: #) Acute pancreatitis:  -Presenting lipase in excess of 1300  -RUQ [name] reviewed with cholelithiasis, full GB without evidence of acute cholecystitis and normal CBD -Follow up MRI reviewed. Findings of 25m stone in CBD, cholelithiasis without acute cholecystitis. Fluid signal lesion in inferior tip of spleen seen, likely benign. Can consider nonemergent abd MRI w/wo contast as outpatient later -cont NPO this AM with pt reporting feeling somewhat better already -Appreciate input by GI.  -Recheck lipase and LFT's in AM   #) SIRS criteria present:  -Presentation notable for leukocytosis, present with cell count of 20,100 representing interval increase compared to 2 days ago, as well as mild tachycardia.  However, no overt evidence of underlying infectious process at this time -recheck cbc in AM   #) Chronic biventricular systolic heart failure: -appears to be euvolemic this AM -holding off on diuretic for now while NPO -Not on an ACE inhibitor or ARB in the setting of stage IV CKD.    #) Poorly controlled type 2 diabetes mellitus - most recent hemoglobin A1c noted to be greater than 15.5% -cont on insulin as needed while in hospital -Pt on Januvia PTA. Given presenting pancreatitis, would avoid continuing moving forward   #) Stage IV CKD:  -renal function appears stable -Recheck bmet in AM   #) Anemia of chronic kidney disease:  -Hgb appears stable at this time -Remains hemodynamically stable -Repeat cbc in AM   #) Hyperlipidemia:  -statin currently on hold in setting of above   #) acquired hypothyroidism:  -Cont synthroid per home regimen    #) Paroxysmal atrial  fibrillation:  -CHA2DS2-VASc score of 6, on eliquis -Rate controlled, on amiodarone and coreg -anticoagulation currently on hold until decision made whether pt would require surgical/procedural intervention     Subjective: Reports feeling better today  Physical Exam: Vitals:   02/01/22 1013 02/01/22 1015 02/01/22 1315 02/01/22 1400  BP:  (!) 119/105 118/63   Pulse:  81 77   Resp:  (!) 21 15   Temp: 98.8 F (37.1 C)   98.6 F (37 C)  TempSrc: Oral   Oral  SpO2:  97% 99%    General exam: Awake, laying in bed, in nad Respiratory system: Normal respiratory effort, no wheezing Cardiovascular system: regular rate, s1, s2 Gastrointestinal system: Soft, nondistended, positive BS Central nervous system: CN2-12 grossly intact, strength intact Extremities: Perfused, no clubbing Skin: Normal skin turgor, no notable skin lesions seen Psychiatry: Mood normal // no visual hallucinations   Data Reviewed:  Labs reviewed: Na 135, K 4.2, Cr 4.03  Family Communication: Pt in room, family not at bedside  Disposition: Status is: Inpatient Remains inpatient appropriate because: Severity of illness  Planned Discharge Destination: Home     Author: SMarylu Lund MD 02/01/2022 4:25 PM  For on call review [email or web address]

## 2022-02-01 NOTE — ED Notes (Signed)
Pt brought to bathroom, unable to urinate.

## 2022-02-01 NOTE — ED Notes (Signed)
Sort tech informed RN that pt wears 2L O2 at night, and was 89% on RA. Pt reports some sob, but states this is normal for her. Pt reports LUQ abd pain w/ nausea, states pain has not changed since being here. Pt reports feeling better w/ oxygen. SpO2 94% on 2L O2

## 2022-02-01 NOTE — Consult Note (Addendum)
Consultation Covering for Dr. Adriana Mccallum  Referring Provider:   [facility] Primary Care Physician:  [name], MD Primary Gastroenterologist:  [facility] (Dr. Benson Norway or Dr. Collene Mares)       Reason for Consultation:   Abdominal pain, gallstone pancreatitis   Attending physician's note  I have taken a history, reviewed the chart and examined the patient. I performed a substantive portion of this encounter, including complete performance of at least one of the key components, in conjunction with the APP. I agree with the APP's note, impression and recommendations.    38 yr F with poorly controlled diabetes, hemoglobin A1c > 15, stage IV chronic kidney disease, CHF, paroxysmal A-fib admitted with acute pancreatitis Abdominal ultrasound with gallstones and prominent CBD, no CBD stones visualized  We will request MRCP to better evaluate common bile duct and to exclude CBD stones Continue supportive care IV fluids Clear liquids as tolerated Hold Eliquis  GI will continue to follow along  The patient was provided an opportunity to ask questions and all were answered. The patient agreed with the plan and demonstrated an understanding of the instructions.  Damaris Hippo , MD [phone]      Impression     Acute Pancreatitis possibly from metabolic derangement, gallstones, medications  Elevated transamines LIPASE 1,341 WBC 25.5 HGB 9.7  BUN 9.7 Cr 3.93  GFR 12  Calcium 8.5  Potassium 3.9  Magnesium 1.8  02/01/2022 Triglycerides 681 No metabolic derangements to account for the pancreatitis. Will order CT without contrast to evaluate further for complications and biliary ducts AB US showed:gallstones, slight CBD prominence  Chronic HFrEF (heart failure with reduced ejection fraction) (HCC) Monitor closely with fluids   CKD (chronic kidney disease), stage IV (HCC)  Type 2 diabetes mellitus with diabetic polyneuropathy, with long-term current use of insulin ([place]) Poorly  controlled type 2 diabetes, DKA  History of anemia due to chronic kidney disease CBC on 02/01/2022   WBC 25.5 HGB 9.7 MCV 84.3 Platelets 226 Anemia studies on 05/29/2019  Iron 20 Ferritin 187 B12 349  Paroxysmal atrial fibrillation (HCC) On Eliquis, last dose 9/08 in the morning    Plan   -No plans for ERCP at this time, will place patients on clear liquids as she is thirsty see if she tolerates it if not go back to ice chips. -We will order MRCP to evaluate for choledocholithiasis, if this is negative possible pancreatitis from metabolic derangement versus medications versus stones. -We will give 1 L bolus -Fluid replacement lactated ringers 150cc/hr can decrease after 12-24 hours to 100-125cc/hr. (prevents ATN/necrosis) -Monitor closely with known CHF fluid overload, can decrease dose -Pain control per the inpatient medical team. -If the patient not tolerating an oral diet at 72 hours would recommend consideration of nutritional support with a nasogastric or nasojejunal postpyloric feedings. -Monitor hemoglobin, no signs of GI bleeding this time, transfuse for hemoglobin less than 7 -Monitor kidney function closely -  Thank you for your kind consultation, we will continue to follow.         HPI:   Susan [name] is a 71 y.o. female with past medical history significant for systolic heart failure EF 30 to 35%, CKD stage IV baseline creatinine 3, anemia of chronic disease baseline hemoglobin 10-12, poorly controlled type 2 diabetes, paroxysmal atrial fibrillation on Eliquis, chronic hypoxic respiratory failure on 2 L nasal cannula at night only presents with abdominal pain, suspected acute pancreatitis.  Recent admission to [facility] 08/22-0 8/31  for DKA complicated by acute on chronic heart failure, A-fib with RVR.  Discharged with home health.  Patient with nothing like this prior.  States she ate breakfast yesterday around 830, then after eating lunch she began to have nausea, vomiting and  diarrhea. Right after vomiting she began to have AB pain, epigastric pain, no radiation to her back.  She has had black stool for months, has been on iron tablet, not tarry.  She has history of GERD, she is not on any medication at this time, has had GERD daily, occ dysphagia but has been consistent the last month. Denies odynophagia.  Denies fever, chills.  No family history of GB issues or pancreatitis. .  Last dose of Elquis yesterday morning.  She has had more SOB, has been using O2 during day too. She has had dizziness but no chest pain. She has constant swelling in her legs which is unchanged.   Just started on amidoarone with last admission, no other new medications.  Denies NSAIDS, no ETOH Currently patient states she is thirsty and hungry, states no pain at this time. Abdominal ultrasound showed cholelithiasis without signs of acute cholecystitis, upper normal CBD.  Patient afebrile, slightly tachy, otherwise hemodynamically stable. Creatinine 3.69, glucose 287 Albumin 2.8, alk phos 404, AST 242, ALT 79, total bilirubin 3.2, lipase 1341. WBC 20, hemoglobin 11.5 normocytic, platelet 278 Pending blood cultures  05/27/2020 EGD at [facility] for nausea with vomiting showed mild candidiasis esophagitis no bleeding, gastritis biopsied normal duodenal bulb-negative H. pylori or dysplasia 03/13/2019 colonoscopy and upper endoscopy Dr. Hilarie Fredrickson for melena and acute posthemorrhagic anemia showed 2 sessile polyps ascending colon 2 sessile polyps transverse 4 mm polyp descending 4 mm polyp sigmoid diverticulosis internal hemorrhoids-tubular adenoma x5 no high-grade dysplasia Endoscopy showed normal esophagus, large gastric body hyperplastic polyp with clips placed, probable source of melena.  Small gastric antrum polyp, gastritis. 08/22/2020 CT abdomen pelvis for flank pain showed cholelithiasis no intra or extrahepatic biliary ductal dilatation.  Diverticulosis, moderate stool  Past Medical  History:  Diagnosis Date   Acute MI ([place]) 1999; 2007   Anemia    hx   Anginal pain ([place])    Anxiety    ARF (acute renal failure) ([place]) 06/2017   [facility]   Arthritis    "generalized" (03/15/2014)   CAD (coronary artery disease)    MI in 2000 - MI  2007 - treated bare metal stent (no nuclear since then as 9/11)   Carotid artery disease (HCC)    CHF (congestive heart failure) ([place])    Chronic diastolic heart failure ([place])    a) ECHO (08/2013) EF 55-60% and RV function nl b) RHC (08/2013) RA 4, RV 30/5/7, PA 25/10 (16), PCWP 7, Fick CO/CI 6.3/2.7, PVR 1.5 WU, PA 61 and 66%   Daily headache    "~ every other day; since I fell in June" (03/15/2014)   Depression    Dyslipidemia    Dyspnea    Exertional shortness of breath    HTN (hypertension)    Hypothyroidism    Neuropathy    Obesity    Osteoarthritis    Peripheral neuropathy    PONV (postoperative nausea and vomiting)    RBBB (right bundle branch block)    Old   Stroke ([place])    mini strokes   Syncope    likely due to low blood sugar   Tachycardia    Sinus tachycardia   Type II diabetes mellitus ([place])  Type II   Urinary incontinence    Venous insufficiency     Surgical History:  She  has a past surgical history that includes Knee arthroscopy (Left, 10/25/2006); Abdominal hysterectomy (1980's); Tubal ligation (1970's); Cataract extraction, bilateral (Bilateral, ?2013); Shoulder arthroscopy with open rotator cuff repair (Right, 03/14/2014); right heart catheterization (N/A, 09/22/2013); RIGHT HEART CATH (N/A, 07/24/2017); Eye surgery (Bilateral); Colonoscopy w/ polypectomy; Toe amputation (Right, 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 Family History:  Her family history includes Heart attack (age of onset: 26) in her mother. Social History:   reports that she quit smoking about 24 years ago. Her smoking use included cigarettes. She has a 96.00 pack-year smoking history. She has never used smokeless tobacco. She reports that she does not currently use alcohol. She reports that she does not use drugs.  Prior to Admission medications   Medication Sig Start Date End Date Taking? Authorizing Provider  albuterol (VENTOLIN HFA) 108 (90 Base) MCG/ACT inhaler Inhale 1-2 puffs into the lungs every 6 (six) hours as needed for wheezing or shortness of breath.    [provider]  amiodarone (PACERONE) 200 MG tablet Take 1 tablet (200 mg total) by mouth daily. 01/29/22   Milford, Maricela Bo, FNP  apixaban (ELIQUIS) 5 MG TABS tablet Take 1 tablet (5 mg total) by mouth 2 (two) times daily. 01/29/22   Rafael Bihari, FNP  aspirin EC 81 MG tablet Take 1 tablet (81 mg total) by mouth daily with breakfast. 05/28/20   Emokpae, Courage, MD  atorvastatin (LIPITOR) 80 MG tablet TAKE 1 TABLET BY MOUTH EVERY DAY Patient taking differently: Take 80 mg by mouth daily. 09/20/21   Bensimhon, Shaune Pascal, MD  blood glucose meter kit and supplies Use up to four times daily as directed. 01/23/22   Ghimire, Henreitta Leber, MD  carvedilol (COREG) 6.25 MG tablet Take 1 tablet (6.25 mg total) by mouth 2 (two) times daily with a meal. 01/29/22   Milford, Maricela Bo, FNP  colchicine 0.6 MG tablet Take 0.6 mg by mouth daily as needed (gout).    [provider]  diphenoxylate-atropine (LOMOTIL) 2.5-0.025 MG tablet Take 1 tablet by mouth 4 (four) times daily as needed for diarrhea or loose stools. 08/19/21   [name], MD  ferrous sulfate 325 (65 FE) MG tablet Take 325 mg by mouth 2 (two) times daily.    [provider]  Fingerstix Lancets MISC Use as directed to check blood sugars as need up  to 4 times daily 01/23/22   Ghimire, Henreitta Leber, MD  glucose blood test strip use as directed to check blood sugars up to 4 times daily 01/23/22   Ghimire, Henreitta Leber, MD  insulin aspart (NOVOLOG) 100 UNIT/ML FlexPen 0-9 Units, Subcutaneous, 3 times daily with meals. CBG 70-120: 0 units CBG 121-150: 1 unit CBG 151-200: 2 units CBG 201-250: 3 units CBG 251-300: 5 units CBG 301-350: 7 units CBG 351-400: 9 units CBG>400: call MD 01/23/22   Jonetta Osgood, MD  insulin detemir (LEVEMIR) 100 UNIT/ML FlexPen Inject 15 Units into the skin 2 (two) times daily. 01/23/22   Ghimire, Henreitta Leber, MD  Insulin Pen Needle 32G X 4 MM MISC Use as directed 01/23/22   Jonetta Osgood, MD  isosorbide mononitrate (IMDUR) 30 MG 24 hr tablet  Take 0.5 tablets (15 mg total) by mouth daily. 12/10/21   Milford, Maricela Bo, FNP  JANUVIA 25 MG tablet TAKE 1 TABLET (25 MG TOTAL) BY MOUTH DAILY. 01/31/22   [name], MD  levothyroxine (SYNTHROID, LEVOTHROID) 50 MCG tablet Take 1 tablet (50 mcg total) by mouth daily before breakfast. 11/07/16   Dena Billet B, PA-C  magnesium oxide (MAG-OX) 400 (240 Mg) MG tablet Take 1 tablet (400 mg total) by mouth daily. 07/18/21   [name], MD  metolazone (ZAROXOLYN) 2.5 MG tablet Take 1 tablet (2.5 mg total) by mouth as directed. Every other week 12/10/21 03/10/22  Rafael Bihari, FNP  nitroGLYCERIN (NITROSTAT) 0.4 MG SL tablet PLACE 1 TABLET (0.4 MG TOTAL) UNDER THE TONGUE EVERY 5 (FIVE) MINUTES AS NEEDED FOR CHEST PAIN. Patient not taking: Reported on 01/29/2022 02/06/21   Larey Dresser, MD  Nutritional Supplements (FRUIT & VEGETABLE DAILY PO) Take 1 tablet by mouth daily.    [provider]  nystatin (MYCOSTATIN/NYSTOP) powder Apply 1 Application topically 2 (two) times daily as needed for irritation. 10/07/21   [provider]  nystatin cream (MYCOSTATIN) Apply 1 Application topically 2 (two) times daily. 12/04/21   [provider]  OXYGEN Inhale 2 L/min  into the lungs at bedtime.    [provider]  polyethylene glycol (MIRALAX / GLYCOLAX) 17 g packet Take 17 g by mouth as needed for mild constipation.    [provider]  pregabalin (LYRICA) 150 MG capsule Take 150 mg by mouth 2 (two) times daily. 03/20/21   [provider]  torsemide (DEMADEX) 20 MG tablet Take 4 tablets (80 mg total) by mouth 2 (two) times daily. 12/10/21   Milford, Maricela Bo, FNP  TRINTELLIX 10 MG TABS tablet TAKE 1 TABLET BY MOUTH EVERY DAY Patient taking differently: Take 10 mg by mouth daily. 10/22/21   [name], MD  ursodiol (ACTIGALL) 500 MG tablet Take 500 mg by mouth 3 (three) times daily. 12/11/20   [provider]    Current Facility-Administered Medications  Medication Dose Route Frequency Provider Last Rate Last Admin   acetaminophen (TYLENOL) tablet 650 mg  650 mg Oral Q6H PRN Howerter, Justin B, DO       Or   acetaminophen (TYLENOL) suppository 650 mg  650 mg Rectal Q6H PRN Howerter, Justin B, DO       amiodarone (PACERONE) tablet 200 mg  200 mg Oral Daily Howerter, Justin B, DO       fentaNYL (SUBLIMAZE) injection 50 mcg  50 mcg Intravenous Q2H PRN Howerter, Justin B, DO       insulin aspart (novoLOG) injection 0-9 Units  0-9 Units Subcutaneous Q6H Howerter, Justin B, DO   3 Units at 02/01/22 0865   insulin detemir (LEVEMIR) injection 7 Units  7 Units Subcutaneous BID Howerter, Justin B, DO   7 Units at 02/01/22 7846   LORazepam (ATIVAN) injection 0.5 mg  0.5 mg Intravenous Q4H PRN Howerter, Justin B, DO       naloxone (NARCAN) injection 0.4 mg  0.4 mg Intravenous PRN Howerter, Justin B, DO       Current Outpatient Medications  Medication Sig Dispense Refill   albuterol (VENTOLIN HFA) 108 (90 Base) MCG/ACT inhaler Inhale 1-2 puffs into the lungs every 6 (six) hours as needed for wheezing or shortness of breath.     amiodarone (PACERONE) 200 MG tablet Take 1 tablet (200 mg total) by mouth daily. 90 tablet 3  apixaban (ELIQUIS) 5 MG TABS tablet Take 1 tablet (5 mg total) by mouth 2 (two) times daily. 180 tablet 3   aspirin EC 81 MG tablet Take 1 tablet (81 mg total) by mouth daily with breakfast. 30 tablet 11   atorvastatin (LIPITOR) 80 MG tablet TAKE 1 TABLET BY MOUTH EVERY DAY (Patient taking differently: Take 80 mg by mouth daily.) 90 tablet 1   blood glucose meter kit and supplies Use up to four times daily as directed. 1 each 0   carvedilol (COREG) 6.25 MG tablet Take 1 tablet (6.25 mg total) by mouth 2 (two) times daily with a meal. 180 tablet 3   colchicine 0.6 MG tablet Take 0.6 mg by mouth daily as needed (gout).     diphenoxylate-atropine (LOMOTIL) 2.5-0.025 MG tablet Take 1 tablet by mouth 4 (four) times daily as needed for diarrhea or loose stools. 30 tablet 0   ferrous sulfate 325 (65 FE) MG tablet Take 325 mg by mouth 2 (two) times daily.     Fingerstix Lancets MISC Use as directed to check blood sugars as need up to 4 times daily 100 each 0   glucose blood test strip use as directed to check blood sugars up to 4 times daily 100 each 0   insulin aspart (NOVOLOG) 100 UNIT/ML FlexPen 0-9 Units, Subcutaneous, 3 times daily with meals. CBG 70-120: 0 units CBG 121-150: 1 unit CBG 151-200: 2 units CBG 201-250: 3 units CBG 251-300: 5 units CBG 301-350: 7 units CBG 351-400: 9 units CBG>400: call MD 15 mL 11   insulin detemir (LEVEMIR) 100 UNIT/ML FlexPen Inject 15 Units into the skin 2 (two) times daily. 15 mL 11   Insulin Pen Needle 32G X 4 MM MISC Use as directed 200 each 0   isosorbide mononitrate (IMDUR) 30 MG 24 hr tablet Take 0.5 tablets (15 mg total) by mouth daily. 45 tablet 3   JANUVIA 25 MG tablet TAKE 1 TABLET (25 MG TOTAL) BY MOUTH DAILY. 90 tablet 3   levothyroxine (SYNTHROID, LEVOTHROID) 50 MCG tablet Take 1 tablet (50 mcg total) by mouth daily before breakfast. 90 tablet 1   magnesium oxide (MAG-OX) 400 (240 Mg) MG tablet Take 1 tablet (400 mg total) by mouth daily. 90 tablet 3    metolazone (ZAROXOLYN) 2.5 MG tablet Take 1 tablet (2.5 mg total) by mouth as directed. Every other week 2 tablet 3   nitroGLYCERIN (NITROSTAT) 0.4 MG SL tablet PLACE 1 TABLET (0.4 MG TOTAL) UNDER THE TONGUE EVERY 5 (FIVE) MINUTES AS NEEDED FOR CHEST PAIN. (Patient not taking: Reported on 01/29/2022) 25 tablet 1   Nutritional Supplements (FRUIT & VEGETABLE DAILY PO) Take 1 tablet by mouth daily.     nystatin (MYCOSTATIN/NYSTOP) powder Apply 1 Application topically 2 (two) times daily as needed for irritation.     nystatin cream (MYCOSTATIN) Apply 1 Application topically 2 (two) times daily.     OXYGEN Inhale 2 L/min into the lungs at bedtime.     polyethylene glycol (MIRALAX / GLYCOLAX) 17 g packet Take 17 g by mouth as needed for mild constipation.     pregabalin (LYRICA) 150 MG capsule Take 150 mg by mouth 2 (two) times daily.     torsemide (DEMADEX) 20 MG tablet Take 4 tablets (80 mg total) by mouth 2 (two) times daily. 240 tablet 3   TRINTELLIX 10 MG TABS tablet TAKE 1 TABLET BY MOUTH EVERY DAY (Patient taking differently: Take 10 mg by mouth daily.) 30 tablet 5  ursodiol (ACTIGALL) 500 MG tablet Take 500 mg by mouth 3 (three) times daily.      Allergies as of 01/31/2022 - Review Complete 01/29/2022  Allergen Reaction Noted   Keflex [cephalexin] Diarrhea 09/11/2020   Codeine Nausea And Vomiting and Other (See Comments) 03/22/2021    Review of Systems:    Constitutional: No weight loss, fever, chills, weakness or fatigue HEENT: Eyes: No change in vision               Ears, Nose, Throat:  No change in hearing or congestion Skin: No rash or itching Cardiovascular: No chest pain, chest pressure or palpitations   Respiratory: No SOB or cough Gastrointestinal: See HPI and otherwise negative Genitourinary: No dysuria or change in urinary frequency Neurological: No headache, dizziness or syncope Musculoskeletal: No new muscle or joint pain Hematologic: No bleeding or bruising Psychiatric:  No history of depression or anxiety     Physical Exam:  Vital signs in last 24 hours: Temp:  [97.7 F (36.5 C)-98.9 F (37.2 C)] 97.7 F (36.5 C) (09/09 0624) Pulse Rate:  [75-98] 75 (09/09 0700) Resp:  [15-20] 16 (09/09 0700) BP: (99-160)/(42-125) 115/52 (09/09 0700) SpO2:  [90 %-100 %] 98 % (09/09 0700)   Last BM recorded by nurses in past 5 days No data recorded  General:  Obese chronically ill appearing female in no acute distress Head:  Normocephalic and atraumatic. Eyes: sclerae anicteric,conjunctive pink  Heart:  regular rate and rhythm Pulm: Clear anteriorly; no wheezing Abdomen:  Soft, Obese AB, Sluggish bowel sounds. moderate tenderness in the epigastrium and in the LLQ. With guarding and Without rebound, No organomegaly appreciated. Extremities:  With edema bilateral legs with compression socks on Msk:  Symmetrical without gross deformities. Peripheral pulses intact.  Neurologic:  Alert and  oriented x4;  No focal deficits.  Skin: Ecchymosis bilateral arms.   Dry and intact without significant lesions or rashes. Psychiatric:  Cooperative. Normal mood and affect, appears tired  LAB RESULTS: Recent Labs    01/29/22 1147 01/31/22 2242 02/01/22 0615  WBC 11.6* 20.1* 25.5*  HGB 10.6* 11.5* 9.7*  HCT 33.1* 35.9* 29.5*  PLT 360 278 226   BMET Recent Labs    01/29/22 1147 01/31/22 2242 02/01/22 0615  NA 133* 135 132*  K 3.6 4.5 3.9  CL 92* 93* 90*  CO2 _0 GLUCOSE 246* 287* 246*  BUN 89* 95* 94*  CREATININE 3.08* 3.69* 3.93*  CALCIUM 8.6* 9.1 8.5*   LFT Recent Labs    02/01/22 0615  PROT 5.4*  ALBUMIN 2.4*  AST 114*  ALT 61*  ALKPHOS 273*  BILITOT 1.0  BILIDIR 0.4*   PT/INR Recent Labs    02/01/22 0726  LABPROT 17.9*  INR 1.5*    STUDIES: DG Chest Port 1 View  Result Date: 02/01/2022 CLINICAL DATA:  Abdominal pain, vomiting and diarrhea EXAM: PORTABLE CHEST 1 VIEW COMPARISON:  Prior chest x-ray 01/15/2022 FINDINGS: Stable mild  cardiomegaly. Atherosclerotic calcifications present in the transverse aorta. Inspiratory volumes remain low with probable bibasilar atelectasis. Mild vascular congestion but no overt edema. Patchy airspace opacity in the left lung base partially obscures the hemidiaphragm. No pneumothorax. No acute osseous abnormality. IMPRESSION: 1. Patchy airspace opacity in the left lower lobe partially obscures the hemidiaphragm. This may reflect atelectasis or infiltrate. 2. Cardiomegaly with pulmonary vascular congestion but no overt edema. 3. Low inspiratory volumes, similar compared to prior. Electronically Signed   By: Jacqulynn Cadet M.D.   On:  02/01/2022 07:09   US Abdomen Limited  Result Date: 02/01/2022 CLINICAL DATA:  Assess for gallbladder symptoms. EXAM: ULTRASOUND ABDOMEN LIMITED RIGHT UPPER QUADRANT COMPARISON:  Abdominal CT 08/12/2021 FINDINGS: Gallbladder: Shadowing calculi in the gallbladder. Full gallbladder without wall thickening or focal tenderness. Common bile duct: Diameter: 7-8 mm.  Where visualized, no filling defect. Liver: No focal lesion identified. Within normal limits in parenchymal echogenicity. Portal vein is patent on color Doppler imaging with normal direction of blood flow towards the liver. IMPRESSION: 1. Cholelithiasis. Full gallbladder without signs of acute cholecystitis. 2. Upper normal CBD diameter, please correlate with liver function tests. Electronically Signed   By: Jorje Guild M.D.   On: 02/01/2022 06:37   VAS [name] UPPER EXTREMITY VENOUS DUPLEX  Result Date: 01/30/2022 UPPER VENOUS STUDY  Patient Name:  Susan [name]  Date of Exam:   01/30/2022 Medical Rec #: [id number]     Accession #:    [id number] Date of Birth: 10/10/1950     Patient Gender: F Patient Age:   39 years Exam Location:  [facility] Procedure:      VAS [name] UPPER EXTREMITY VENOUS DUPLEX Referring Phys: [name] MILFORD --------------------------------------------------------------------------------   Indications: Swelling, and Pain Comparison Study: no prior Performing Technologist: Archie Patten RVS  Examination Guidelines: A complete evaluation includes B-mode imaging, spectral Doppler, color Doppler, and power Doppler as needed of all accessible portions of each vessel. Bilateral testing is considered an integral part of a complete examination. Limited examinations for reoccurring indications may be performed as noted.  Right Findings: +----------+------------+---------+-----------+----------+-------+ RIGHT     CompressiblePhasicitySpontaneousPropertiesSummary +----------+------------+---------+-----------+----------+-------+ Subclavian               Yes       Yes                      +----------+------------+---------+-----------+----------+-------+  Left Findings: +----------+------------+---------+-----------+----------+-------+ LEFT      CompressiblePhasicitySpontaneousPropertiesSummary +----------+------------+---------+-----------+----------+-------+ IJV           Full       Yes       Yes                      +----------+------------+---------+-----------+----------+-------+ Subclavian    Full       Yes       Yes                      +----------+------------+---------+-----------+----------+-------+ Axillary      Full       Yes       Yes                      +----------+------------+---------+-----------+----------+-------+ Brachial      Full       Yes       Yes                      +----------+------------+---------+-----------+----------+-------+ Radial        Full                                          +----------+------------+---------+-----------+----------+-------+ Ulnar         Full                                          +----------+------------+---------+-----------+----------+-------+  Cephalic      Full                                          +----------+------------+---------+-----------+----------+-------+ Basilic        Full                                          +----------+------------+---------+-----------+----------+-------+  Summary:  Right: No evidence of thrombosis in the subclavian.  Left: No evidence of deep vein thrombosis in the upper extremity. No evidence of superficial vein thrombosis in the upper extremity. Area of mixed echoes in the muscle fascia noted in the proximal upper arm. Etiology unknown. Further testing may be warranted.  *See table(s) above for measurements and observations.  Diagnosing physician: Jamelle Haring Electronically signed by Jamelle Haring on 01/30/2022 at 4:22:23 PM.    Final      Vladimir Crofts  02/01/2022, 8:28 AM

## 2022-02-01 NOTE — H&P (View-Only) (Signed)
Consultation Covering for Dr. Adriana Mccallum  Referring Provider:   [facility] Primary Care Physician:  [name], MD Primary Gastroenterologist:  [facility] (Dr. Benson Norway or Dr. Collene Mares)       Reason for Consultation:   Abdominal pain, gallstone pancreatitis   Attending physician's note  I have taken a history, reviewed the chart and examined the patient. I performed a substantive portion of this encounter, including complete performance of at least one of the key components, in conjunction with the APP. I agree with the APP's note, impression and recommendations.    38 yr F with poorly controlled diabetes, hemoglobin A1c > 15, stage IV chronic kidney disease, CHF, paroxysmal A-fib admitted with acute pancreatitis Abdominal ultrasound with gallstones and prominent CBD, no CBD stones visualized  We will request MRCP to better evaluate common bile duct and to exclude CBD stones Continue supportive care IV fluids Clear liquids as tolerated Hold Eliquis  GI will continue to follow along  The patient was provided an opportunity to ask questions and all were answered. The patient agreed with the plan and demonstrated an understanding of the instructions.  Damaris Hippo , MD [phone]      Impression     Acute Pancreatitis possibly from metabolic derangement, gallstones, medications  Elevated transamines LIPASE 1,341 WBC 25.5 HGB 9.7  BUN 9.7 Cr 3.93  GFR 12  Calcium 8.5  Potassium 3.9  Magnesium 1.8  02/01/2022 Triglycerides 681 No metabolic derangements to account for the pancreatitis. Will order CT without contrast to evaluate further for complications and biliary ducts AB US showed:gallstones, slight CBD prominence  Chronic HFrEF (heart failure with reduced ejection fraction) (HCC) Monitor closely with fluids   CKD (chronic kidney disease), stage IV (HCC)  Type 2 diabetes mellitus with diabetic polyneuropathy, with long-term current use of insulin ([place]) Poorly  controlled type 2 diabetes, DKA  History of anemia due to chronic kidney disease CBC on 02/01/2022   WBC 25.5 HGB 9.7 MCV 84.3 Platelets 226 Anemia studies on 05/29/2019  Iron 20 Ferritin 187 B12 349  Paroxysmal atrial fibrillation (HCC) On Eliquis, last dose 9/08 in the morning    Plan   -No plans for ERCP at this time, will place patients on clear liquids as she is thirsty see if she tolerates it if not go back to ice chips. -We will order MRCP to evaluate for choledocholithiasis, if this is negative possible pancreatitis from metabolic derangement versus medications versus stones. -We will give 1 L bolus -Fluid replacement lactated ringers 150cc/hr can decrease after 12-24 hours to 100-125cc/hr. (prevents ATN/necrosis) -Monitor closely with known CHF fluid overload, can decrease dose -Pain control per the inpatient medical team. -If the patient not tolerating an oral diet at 72 hours would recommend consideration of nutritional support with a nasogastric or nasojejunal postpyloric feedings. -Monitor hemoglobin, no signs of GI bleeding this time, transfuse for hemoglobin less than 7 -Monitor kidney function closely -  Thank you for your kind consultation, we will continue to follow.         HPI:   Susan [name] is a 71 y.o. female with past medical history significant for systolic heart failure EF 30 to 35%, CKD stage IV baseline creatinine 3, anemia of chronic disease baseline hemoglobin 10-12, poorly controlled type 2 diabetes, paroxysmal atrial fibrillation on Eliquis, chronic hypoxic respiratory failure on 2 L nasal cannula at night only presents with abdominal pain, suspected acute pancreatitis.  Recent admission to [facility] 08/22-0 8/31  for DKA complicated by acute on chronic heart failure, A-fib with RVR.  Discharged with home health.  Patient with nothing like this prior.  States she ate breakfast yesterday around 830, then after eating lunch she began to have nausea, vomiting and  diarrhea. Right after vomiting she began to have AB pain, epigastric pain, no radiation to her back.  She has had black stool for months, has been on iron tablet, not tarry.  She has history of GERD, she is not on any medication at this time, has had GERD daily, occ dysphagia but has been consistent the last month. Denies odynophagia.  Denies fever, chills.  No family history of GB issues or pancreatitis. .  Last dose of Elquis yesterday morning.  She has had more SOB, has been using O2 during day too. She has had dizziness but no chest pain. She has constant swelling in her legs which is unchanged.   Just started on amidoarone with last admission, no other new medications.  Denies NSAIDS, no ETOH Currently patient states she is thirsty and hungry, states no pain at this time. Abdominal ultrasound showed cholelithiasis without signs of acute cholecystitis, upper normal CBD.  Patient afebrile, slightly tachy, otherwise hemodynamically stable. Creatinine 3.69, glucose 287 Albumin 2.8, alk phos 404, AST 242, ALT 79, total bilirubin 3.2, lipase 1341. WBC 20, hemoglobin 11.5 normocytic, platelet 278 Pending blood cultures  05/27/2020 EGD at [facility] for nausea with vomiting showed mild candidiasis esophagitis no bleeding, gastritis biopsied normal duodenal bulb-negative H. pylori or dysplasia 03/13/2019 colonoscopy and upper endoscopy Dr. Hilarie Fredrickson for melena and acute posthemorrhagic anemia showed 2 sessile polyps ascending colon 2 sessile polyps transverse 4 mm polyp descending 4 mm polyp sigmoid diverticulosis internal hemorrhoids-tubular adenoma x5 no high-grade dysplasia Endoscopy showed normal esophagus, large gastric body hyperplastic polyp with clips placed, probable source of melena.  Small gastric antrum polyp, gastritis. 08/22/2020 CT abdomen pelvis for flank pain showed cholelithiasis no intra or extrahepatic biliary ductal dilatation.  Diverticulosis, moderate stool  Past Medical  History:  Diagnosis Date   Acute MI ([place]) 1999; 2007   Anemia    hx   Anginal pain ([place])    Anxiety    ARF (acute renal failure) ([place]) 06/2017   [facility]   Arthritis    "generalized" (03/15/2014)   CAD (coronary artery disease)    MI in 2000 - MI  2007 - treated bare metal stent (no nuclear since then as 9/11)   Carotid artery disease (HCC)    CHF (congestive heart failure) ([place])    Chronic diastolic heart failure ([place])    a) ECHO (08/2013) EF 55-60% and RV function nl b) RHC (08/2013) RA 4, RV 30/5/7, PA 25/10 (16), PCWP 7, Fick CO/CI 6.3/2.7, PVR 1.5 WU, PA 61 and 66%   Daily headache    "~ every other day; since I fell in June" (03/15/2014)   Depression    Dyslipidemia    Dyspnea    Exertional shortness of breath    HTN (hypertension)    Hypothyroidism    Neuropathy    Obesity    Osteoarthritis    Peripheral neuropathy    PONV (postoperative nausea and vomiting)    RBBB (right bundle branch block)    Old   Stroke ([place])    mini strokes   Syncope    likely due to low blood sugar   Tachycardia    Sinus tachycardia   Type II diabetes mellitus ([place])  Type II   Urinary incontinence    Venous insufficiency     Surgical History:  She  has a past surgical history that includes Knee arthroscopy (Left, 10/25/2006); Abdominal hysterectomy (1980's); Tubal ligation (1970's); Cataract extraction, bilateral (Bilateral, ?2013); Shoulder arthroscopy with open rotator cuff repair (Right, 03/14/2014); right heart catheterization (N/A, 09/22/2013); RIGHT HEART CATH (N/A, 07/24/2017); Eye surgery (Bilateral); Colonoscopy w/ polypectomy; Toe amputation (Right, 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 Family History:  Her family history includes Heart attack (age of onset: 26) in her mother. Social History:   reports that she quit smoking about 24 years ago. Her smoking use included cigarettes. She has a 96.00 pack-year smoking history. She has never used smokeless tobacco. She reports that she does not currently use alcohol. She reports that she does not use drugs.  Prior to Admission medications   Medication Sig Start Date End Date Taking? Authorizing Provider  albuterol (VENTOLIN HFA) 108 (90 Base) MCG/ACT inhaler Inhale 1-2 puffs into the lungs every 6 (six) hours as needed for wheezing or shortness of breath.    [provider]  amiodarone (PACERONE) 200 MG tablet Take 1 tablet (200 mg total) by mouth daily. 01/29/22   Milford, Maricela Bo, FNP  apixaban (ELIQUIS) 5 MG TABS tablet Take 1 tablet (5 mg total) by mouth 2 (two) times daily. 01/29/22   Rafael Bihari, FNP  aspirin EC 81 MG tablet Take 1 tablet (81 mg total) by mouth daily with breakfast. 05/28/20   Emokpae, Courage, MD  atorvastatin (LIPITOR) 80 MG tablet TAKE 1 TABLET BY MOUTH EVERY DAY Patient taking differently: Take 80 mg by mouth daily. 09/20/21   Bensimhon, Shaune Pascal, MD  blood glucose meter kit and supplies Use up to four times daily as directed. 01/23/22   Ghimire, Henreitta Leber, MD  carvedilol (COREG) 6.25 MG tablet Take 1 tablet (6.25 mg total) by mouth 2 (two) times daily with a meal. 01/29/22   Milford, Maricela Bo, FNP  colchicine 0.6 MG tablet Take 0.6 mg by mouth daily as needed (gout).    [provider]  diphenoxylate-atropine (LOMOTIL) 2.5-0.025 MG tablet Take 1 tablet by mouth 4 (four) times daily as needed for diarrhea or loose stools. 08/19/21   [name], MD  ferrous sulfate 325 (65 FE) MG tablet Take 325 mg by mouth 2 (two) times daily.    [provider]  Fingerstix Lancets MISC Use as directed to check blood sugars as need up  to 4 times daily 01/23/22   Ghimire, Henreitta Leber, MD  glucose blood test strip use as directed to check blood sugars up to 4 times daily 01/23/22   Ghimire, Henreitta Leber, MD  insulin aspart (NOVOLOG) 100 UNIT/ML FlexPen 0-9 Units, Subcutaneous, 3 times daily with meals. CBG 70-120: 0 units CBG 121-150: 1 unit CBG 151-200: 2 units CBG 201-250: 3 units CBG 251-300: 5 units CBG 301-350: 7 units CBG 351-400: 9 units CBG>400: call MD 01/23/22   Jonetta Osgood, MD  insulin detemir (LEVEMIR) 100 UNIT/ML FlexPen Inject 15 Units into the skin 2 (two) times daily. 01/23/22   Ghimire, Henreitta Leber, MD  Insulin Pen Needle 32G X 4 MM MISC Use as directed 01/23/22   Jonetta Osgood, MD  isosorbide mononitrate (IMDUR) 30 MG 24 hr tablet  Take 0.5 tablets (15 mg total) by mouth daily. 12/10/21   Milford, Maricela Bo, FNP  JANUVIA 25 MG tablet TAKE 1 TABLET (25 MG TOTAL) BY MOUTH DAILY. 01/31/22   [name], MD  levothyroxine (SYNTHROID, LEVOTHROID) 50 MCG tablet Take 1 tablet (50 mcg total) by mouth daily before breakfast. 11/07/16   Dena Billet B, PA-C  magnesium oxide (MAG-OX) 400 (240 Mg) MG tablet Take 1 tablet (400 mg total) by mouth daily. 07/18/21   [name], MD  metolazone (ZAROXOLYN) 2.5 MG tablet Take 1 tablet (2.5 mg total) by mouth as directed. Every other week 12/10/21 03/10/22  Rafael Bihari, FNP  nitroGLYCERIN (NITROSTAT) 0.4 MG SL tablet PLACE 1 TABLET (0.4 MG TOTAL) UNDER THE TONGUE EVERY 5 (FIVE) MINUTES AS NEEDED FOR CHEST PAIN. Patient not taking: Reported on 01/29/2022 02/06/21   Larey Dresser, MD  Nutritional Supplements (FRUIT & VEGETABLE DAILY PO) Take 1 tablet by mouth daily.    [provider]  nystatin (MYCOSTATIN/NYSTOP) powder Apply 1 Application topically 2 (two) times daily as needed for irritation. 10/07/21   [provider]  nystatin cream (MYCOSTATIN) Apply 1 Application topically 2 (two) times daily. 12/04/21   [provider]  OXYGEN Inhale 2 L/min  into the lungs at bedtime.    [provider]  polyethylene glycol (MIRALAX / GLYCOLAX) 17 g packet Take 17 g by mouth as needed for mild constipation.    [provider]  pregabalin (LYRICA) 150 MG capsule Take 150 mg by mouth 2 (two) times daily. 03/20/21   [provider]  torsemide (DEMADEX) 20 MG tablet Take 4 tablets (80 mg total) by mouth 2 (two) times daily. 12/10/21   Milford, Maricela Bo, FNP  TRINTELLIX 10 MG TABS tablet TAKE 1 TABLET BY MOUTH EVERY DAY Patient taking differently: Take 10 mg by mouth daily. 10/22/21   [name], MD  ursodiol (ACTIGALL) 500 MG tablet Take 500 mg by mouth 3 (three) times daily. 12/11/20   [provider]    Current Facility-Administered Medications  Medication Dose Route Frequency Provider Last Rate Last Admin   acetaminophen (TYLENOL) tablet 650 mg  650 mg Oral Q6H PRN Howerter, Justin B, DO       Or   acetaminophen (TYLENOL) suppository 650 mg  650 mg Rectal Q6H PRN Howerter, Justin B, DO       amiodarone (PACERONE) tablet 200 mg  200 mg Oral Daily Howerter, Justin B, DO       fentaNYL (SUBLIMAZE) injection 50 mcg  50 mcg Intravenous Q2H PRN Howerter, Justin B, DO       insulin aspart (novoLOG) injection 0-9 Units  0-9 Units Subcutaneous Q6H Howerter, Justin B, DO   3 Units at 02/01/22 0865   insulin detemir (LEVEMIR) injection 7 Units  7 Units Subcutaneous BID Howerter, Justin B, DO   7 Units at 02/01/22 7846   LORazepam (ATIVAN) injection 0.5 mg  0.5 mg Intravenous Q4H PRN Howerter, Justin B, DO       naloxone (NARCAN) injection 0.4 mg  0.4 mg Intravenous PRN Howerter, Justin B, DO       Current Outpatient Medications  Medication Sig Dispense Refill   albuterol (VENTOLIN HFA) 108 (90 Base) MCG/ACT inhaler Inhale 1-2 puffs into the lungs every 6 (six) hours as needed for wheezing or shortness of breath.     amiodarone (PACERONE) 200 MG tablet Take 1 tablet (200 mg total) by mouth daily. 90 tablet 3  apixaban (ELIQUIS) 5 MG TABS tablet Take 1 tablet (5 mg total) by mouth 2 (two) times daily. 180 tablet 3   aspirin EC 81 MG tablet Take 1 tablet (81 mg total) by mouth daily with breakfast. 30 tablet 11   atorvastatin (LIPITOR) 80 MG tablet TAKE 1 TABLET BY MOUTH EVERY DAY (Patient taking differently: Take 80 mg by mouth daily.) 90 tablet 1   blood glucose meter kit and supplies Use up to four times daily as directed. 1 each 0   carvedilol (COREG) 6.25 MG tablet Take 1 tablet (6.25 mg total) by mouth 2 (two) times daily with a meal. 180 tablet 3   colchicine 0.6 MG tablet Take 0.6 mg by mouth daily as needed (gout).     diphenoxylate-atropine (LOMOTIL) 2.5-0.025 MG tablet Take 1 tablet by mouth 4 (four) times daily as needed for diarrhea or loose stools. 30 tablet 0   ferrous sulfate 325 (65 FE) MG tablet Take 325 mg by mouth 2 (two) times daily.     Fingerstix Lancets MISC Use as directed to check blood sugars as need up to 4 times daily 100 each 0   glucose blood test strip use as directed to check blood sugars up to 4 times daily 100 each 0   insulin aspart (NOVOLOG) 100 UNIT/ML FlexPen 0-9 Units, Subcutaneous, 3 times daily with meals. CBG 70-120: 0 units CBG 121-150: 1 unit CBG 151-200: 2 units CBG 201-250: 3 units CBG 251-300: 5 units CBG 301-350: 7 units CBG 351-400: 9 units CBG>400: call MD 15 mL 11   insulin detemir (LEVEMIR) 100 UNIT/ML FlexPen Inject 15 Units into the skin 2 (two) times daily. 15 mL 11   Insulin Pen Needle 32G X 4 MM MISC Use as directed 200 each 0   isosorbide mononitrate (IMDUR) 30 MG 24 hr tablet Take 0.5 tablets (15 mg total) by mouth daily. 45 tablet 3   JANUVIA 25 MG tablet TAKE 1 TABLET (25 MG TOTAL) BY MOUTH DAILY. 90 tablet 3   levothyroxine (SYNTHROID, LEVOTHROID) 50 MCG tablet Take 1 tablet (50 mcg total) by mouth daily before breakfast. 90 tablet 1   magnesium oxide (MAG-OX) 400 (240 Mg) MG tablet Take 1 tablet (400 mg total) by mouth daily. 90 tablet 3    metolazone (ZAROXOLYN) 2.5 MG tablet Take 1 tablet (2.5 mg total) by mouth as directed. Every other week 2 tablet 3   nitroGLYCERIN (NITROSTAT) 0.4 MG SL tablet PLACE 1 TABLET (0.4 MG TOTAL) UNDER THE TONGUE EVERY 5 (FIVE) MINUTES AS NEEDED FOR CHEST PAIN. (Patient not taking: Reported on 01/29/2022) 25 tablet 1   Nutritional Supplements (FRUIT & VEGETABLE DAILY PO) Take 1 tablet by mouth daily.     nystatin (MYCOSTATIN/NYSTOP) powder Apply 1 Application topically 2 (two) times daily as needed for irritation.     nystatin cream (MYCOSTATIN) Apply 1 Application topically 2 (two) times daily.     OXYGEN Inhale 2 L/min into the lungs at bedtime.     polyethylene glycol (MIRALAX / GLYCOLAX) 17 g packet Take 17 g by mouth as needed for mild constipation.     pregabalin (LYRICA) 150 MG capsule Take 150 mg by mouth 2 (two) times daily.     torsemide (DEMADEX) 20 MG tablet Take 4 tablets (80 mg total) by mouth 2 (two) times daily. 240 tablet 3   TRINTELLIX 10 MG TABS tablet TAKE 1 TABLET BY MOUTH EVERY DAY (Patient taking differently: Take 10 mg by mouth daily.) 30 tablet 5  ursodiol (ACTIGALL) 500 MG tablet Take 500 mg by mouth 3 (three) times daily.      Allergies as of 01/31/2022 - Review Complete 01/29/2022  Allergen Reaction Noted   Keflex [cephalexin] Diarrhea 09/11/2020   Codeine Nausea And Vomiting and Other (See Comments) 03/22/2021    Review of Systems:    Constitutional: No weight loss, fever, chills, weakness or fatigue HEENT: Eyes: No change in vision               Ears, Nose, Throat:  No change in hearing or congestion Skin: No rash or itching Cardiovascular: No chest pain, chest pressure or palpitations   Respiratory: No SOB or cough Gastrointestinal: See HPI and otherwise negative Genitourinary: No dysuria or change in urinary frequency Neurological: No headache, dizziness or syncope Musculoskeletal: No new muscle or joint pain Hematologic: No bleeding or bruising Psychiatric:  No history of depression or anxiety     Physical Exam:  Vital signs in last 24 hours: Temp:  [97.7 F (36.5 C)-98.9 F (37.2 C)] 97.7 F (36.5 C) (09/09 0624) Pulse Rate:  [75-98] 75 (09/09 0700) Resp:  [15-20] 16 (09/09 0700) BP: (99-160)/(42-125) 115/52 (09/09 0700) SpO2:  [90 %-100 %] 98 % (09/09 0700)   Last BM recorded by nurses in past 5 days No data recorded  General:  Obese chronically ill appearing female in no acute distress Head:  Normocephalic and atraumatic. Eyes: sclerae anicteric,conjunctive pink  Heart:  regular rate and rhythm Pulm: Clear anteriorly; no wheezing Abdomen:  Soft, Obese AB, Sluggish bowel sounds. moderate tenderness in the epigastrium and in the LLQ. With guarding and Without rebound, No organomegaly appreciated. Extremities:  With edema bilateral legs with compression socks on Msk:  Symmetrical without gross deformities. Peripheral pulses intact.  Neurologic:  Alert and  oriented x4;  No focal deficits.  Skin: Ecchymosis bilateral arms.   Dry and intact without significant lesions or rashes. Psychiatric:  Cooperative. Normal mood and affect, appears tired  LAB RESULTS: Recent Labs    01/29/22 1147 01/31/22 2242 02/01/22 0615  WBC 11.6* 20.1* 25.5*  HGB 10.6* 11.5* 9.7*  HCT 33.1* 35.9* 29.5*  PLT 360 278 226   BMET Recent Labs    01/29/22 1147 01/31/22 2242 02/01/22 0615  NA 133* 135 132*  K 3.6 4.5 3.9  CL 92* 93* 90*  CO2 _0 GLUCOSE 246* 287* 246*  BUN 89* 95* 94*  CREATININE 3.08* 3.69* 3.93*  CALCIUM 8.6* 9.1 8.5*   LFT Recent Labs    02/01/22 0615  PROT 5.4*  ALBUMIN 2.4*  AST 114*  ALT 61*  ALKPHOS 273*  BILITOT 1.0  BILIDIR 0.4*   PT/INR Recent Labs    02/01/22 0726  LABPROT 17.9*  INR 1.5*    STUDIES: DG Chest Port 1 View  Result Date: 02/01/2022 CLINICAL DATA:  Abdominal pain, vomiting and diarrhea EXAM: PORTABLE CHEST 1 VIEW COMPARISON:  Prior chest x-ray 01/15/2022 FINDINGS: Stable mild  cardiomegaly. Atherosclerotic calcifications present in the transverse aorta. Inspiratory volumes remain low with probable bibasilar atelectasis. Mild vascular congestion but no overt edema. Patchy airspace opacity in the left lung base partially obscures the hemidiaphragm. No pneumothorax. No acute osseous abnormality. IMPRESSION: 1. Patchy airspace opacity in the left lower lobe partially obscures the hemidiaphragm. This may reflect atelectasis or infiltrate. 2. Cardiomegaly with pulmonary vascular congestion but no overt edema. 3. Low inspiratory volumes, similar compared to prior. Electronically Signed   By: Jacqulynn Cadet M.D.   On:  02/01/2022 07:09   US Abdomen Limited  Result Date: 02/01/2022 CLINICAL DATA:  Assess for gallbladder symptoms. EXAM: ULTRASOUND ABDOMEN LIMITED RIGHT UPPER QUADRANT COMPARISON:  Abdominal CT 08/12/2021 FINDINGS: Gallbladder: Shadowing calculi in the gallbladder. Full gallbladder without wall thickening or focal tenderness. Common bile duct: Diameter: 7-8 mm.  Where visualized, no filling defect. Liver: No focal lesion identified. Within normal limits in parenchymal echogenicity. Portal vein is patent on color Doppler imaging with normal direction of blood flow towards the liver. IMPRESSION: 1. Cholelithiasis. Full gallbladder without signs of acute cholecystitis. 2. Upper normal CBD diameter, please correlate with liver function tests. Electronically Signed   By: Jorje Guild M.D.   On: 02/01/2022 06:37   VAS [name] UPPER EXTREMITY VENOUS DUPLEX  Result Date: 01/30/2022 UPPER VENOUS STUDY  Patient Name:  ATLANTA PELTO  Date of Exam:   01/30/2022 Medical Rec #: [id number]     Accession #:    [id number] Date of Birth: 10/10/1950     Patient Gender: F Patient Age:   39 years Exam Location:  [facility] Procedure:      VAS [name] UPPER EXTREMITY VENOUS DUPLEX Referring Phys: [name] MILFORD --------------------------------------------------------------------------------   Indications: Swelling, and Pain Comparison Study: no prior Performing Technologist: Archie Patten RVS  Examination Guidelines: A complete evaluation includes B-mode imaging, spectral Doppler, color Doppler, and power Doppler as needed of all accessible portions of each vessel. Bilateral testing is considered an integral part of a complete examination. Limited examinations for reoccurring indications may be performed as noted.  Right Findings: +----------+------------+---------+-----------+----------+-------+ RIGHT     CompressiblePhasicitySpontaneousPropertiesSummary +----------+------------+---------+-----------+----------+-------+ Subclavian               Yes       Yes                      +----------+------------+---------+-----------+----------+-------+  Left Findings: +----------+------------+---------+-----------+----------+-------+ LEFT      CompressiblePhasicitySpontaneousPropertiesSummary +----------+------------+---------+-----------+----------+-------+ IJV           Full       Yes       Yes                      +----------+------------+---------+-----------+----------+-------+ Subclavian    Full       Yes       Yes                      +----------+------------+---------+-----------+----------+-------+ Axillary      Full       Yes       Yes                      +----------+------------+---------+-----------+----------+-------+ Brachial      Full       Yes       Yes                      +----------+------------+---------+-----------+----------+-------+ Radial        Full                                          +----------+------------+---------+-----------+----------+-------+ Ulnar         Full                                          +----------+------------+---------+-----------+----------+-------+  Cephalic      Full                                          +----------+------------+---------+-----------+----------+-------+ Basilic        Full                                          +----------+------------+---------+-----------+----------+-------+  Summary:  Right: No evidence of thrombosis in the subclavian.  Left: No evidence of deep vein thrombosis in the upper extremity. No evidence of superficial vein thrombosis in the upper extremity. Area of mixed echoes in the muscle fascia noted in the proximal upper arm. Etiology unknown. Further testing may be warranted.  *See table(s) above for measurements and observations.  Diagnosing physician: Jamelle Haring Electronically signed by Jamelle Haring on 01/30/2022 at 4:22:23 PM.    Final      Vladimir Crofts  02/01/2022, 8:28 AM

## 2022-02-01 NOTE — Hospital Course (Signed)
83RQ with hx systolic chf, DM, CKD 4, afib on eliquis presented with epigastric pain with n/v admitted for acute pancreatitis.

## 2022-02-01 NOTE — ED Notes (Signed)
Patient oxygen 89-90% RA upon vitals reassessment. Patient states she wears 2L O2 at night. Patient placed on 2L O2 [place]. Improvement to 94%. Sort RN notified.

## 2022-02-01 NOTE — ED Notes (Signed)
Patient transported to MRI 

## 2022-02-01 NOTE — H&P (Signed)
History and Physical    PLEASE NOTE THAT DRAGON DICTATION SOFTWARE WAS USED IN THE CONSTRUCTION OF THIS NOTE.   Susan [name] WLN:[id number] DOB: Nov 21, 1950 DOA: 01/31/2022  PCP: [name], MD  Patient coming from: home   I have personally briefly reviewed patient's old medical records in [place]  Chief Complaint: Abdominal pain  HPI: Susan [name] is a 71 y.o. female with medical history significant for chronic biventricular systolic heart failure with LVEF 30 to 35%, stage IV CKD with baseline creatinine range 3.0-3.8, anemia of chronic kidney disease with baseline hemoglobin 10-12, poorly controlled type 2 diabetes mellitus, hypertension, hyperlipidemia, paroxysmal atrial fibrillation chronically anticoagulated on Eliquis, chronic hypoxic respiratory failure on 2 L nasal cannula nightly, who is admitted to [facility] on 01/31/2022 with suspected acute pancreatitis after presenting from home to [facility] ED complaining of abdominal pain.   Of note, the patient was recently hospitalized at [facility] from 01/14/2022 to 01/23/22 for DKA with hospital course complicated by acute on chronic biventricular systolic heart failure, atrial fibrillation with RVR.  She was subsequently discharged to home with home health from the hospitalist service on 01/23/2022.   She presents to [facility] emergency department this evening complaining of 2 to 3 days of new onset epigastric discomfort, which she describes as sharp, with radiation into the bilateral upper abdominal quadrants.  Has progressed in severity since onset, is now nearly constant, with postprandial exacerbation as well as exacerbation with palpation over the abdomen.  No preceding trauma.  This abdominal pain is associated with recurrent nausea resulting in at least 3-4 daily episodes of nonbloody, nonbilious emesis over that timeframe, with most recent such episode occurring just prior to presenting to [facility] emergency department this  evening.  She notes consequential decline in oral intake of food and water over that time.  She also notes new onset loose stool over the last 2 to 3 days, reporting 1-2 such episodes per day over that timeframe, in the absence of any melena or hematochezia.  No recent travel.  Denies any associated subjective fever, chills, rigors, or generalized myalgias.  Not associate with any new onset rash, neck stiffness, shortness of breath, cough, dysuria or gross hematuria.  She also denies any recent chest pain, palpitations, diaphoresis.   Denies any routine or recent alcohol consumption, nor any use of recreational drugs.  Outpatient medications notable for Januvia in the setting of poorly controlled type 2 diabetes mellitus.  Does not take Depakote nor any recent systemic corticosteroids.  Medical history notable for chronic biventricular systolic heart failure, with most recent echocardiogram on 01/17/2022 showing LVEF 30 to 35%, indeterminate diastolic parameters, moderately reduced right ventricular systolic function, mild to moderate tricuspid regurgitation, and trivial mitral regurgitation.  She also has a history of paroxysmal atrial fibrillation for which she is anticoagulated on Eliquis.  Outpatient medications include Coreg as well as amiodarone.  Per chart review, she also has a history of stage IV CKD with baseline creatinine range 3.0-3.8.  History of chronic hypoxic respiratory failure is noted, with the patient on 2 L nasal cannula that appears limited to nocturnal timeframe.  Per chart review, most recent liver enzymes, which were drawn on 01/14/2022, were notable for the following: Alkaline phosphatase 103, AST 28, ALT 24, total bilirubin 1.9.  Additionally, most recent prior lipase, also drawn on 01/14/2022 was noted to be 38.     ED Course:  Vital signs in the ED were notable for the following: Afebrile;  heart rate 87-98; blood pressure 159/67; respiratory rate 15-20; oxygen saturation 94  to 100% on her baseline 2 L nasal cannula nocturnal.   Labs were notable for the following: CMP notable for the following: Sodium 135,  At 25, creatinine 3.69, glucose 287, calcium, adjusted for mild hypoalbuminemia noted to be approximately 10.0, albumin 2.8, alkaline phosphatase 404, AST 242, ALT 79, total bilirubin 3.2.  Lipase 1341.  CBC notable for white blood cell count 20,100 relative to most recent prior white blood cell count of 11,600 on 01/29/2022, hemoglobin 11.56 with normocytic/normochromic findings, platelet count 278.  Urinalysis notable for 6-10 white blood cells, leukocyte esterase/nitrate negative.  Blood cultures x2 collected.  Imaging and additional notable ED work-up: EDP is ordered abdominal ultrasound, with result currently pending.  EDP has notified [facility], Dr. Havery Moros, Of consult request for acute pancreatitis concerning for acute biliary source.   Subsequently, the patient was admitted for further evaluation and management of suspected acute pancreatitis, potentially on the basis of acute biliary pancreatitis given evidence of mild transaminitis with potential corresponding cholestatic pattern, with result of abdominal ultrasound currently pending, presenting with 2 to 3 days of new onset epigastric pain associate with nausea, vomiting, loose stool.     Review of Systems: As per HPI otherwise 10 point review of systems negative.   Past Medical History:  Diagnosis Date   Acute MI ([place]) 1999; 2007   Anemia    hx   Anginal pain ([place])    Anxiety    ARF (acute renal failure) ([place]) 06/2017   Nice Kidney Asso   Arthritis    "generalized" (03/15/2014)   CAD (coronary artery disease)    MI in 2000 - MI  2007 - treated bare metal stent (no nuclear since then as 9/11)   Carotid artery disease (HCC)    CHF (congestive heart failure) ([place])    Chronic diastolic heart failure ([place])    a) ECHO (08/2013) EF 55-60% and RV function nl b) RHC (08/2013) RA  4, RV 30/5/7, PA 25/10 (16), PCWP 7, Fick CO/CI 6.3/2.7, PVR 1.5 WU, PA 61 and 66%   Daily headache    "~ every other day; since I fell in June" (03/15/2014)   Depression    Dyslipidemia    Dyspnea    Exertional shortness of breath    HTN (hypertension)    Hypothyroidism    Neuropathy    Obesity    Osteoarthritis    Peripheral neuropathy    PONV (postoperative nausea and vomiting)    RBBB (right bundle branch block)    Old   Stroke ([place])    mini strokes   Syncope    likely due to low blood sugar   Tachycardia    Sinus tachycardia   Type II diabetes mellitus ([place])    Type II   Urinary incontinence    Venous insufficiency     Past Surgical History:  Procedure Laterality Date   ABDOMINAL HYSTERECTOMY  1980's   AMPUTATION Right 02/24/2018   Procedure: RIGHT FOOT GREAT TOE AND 2ND TOE AMPUTATION;  Surgeon: Newt Minion, MD;  Location: [place];  Service: Orthopedics;  Laterality: Right;   AMPUTATION Right 04/30/2018   Procedure: RIGHT TRANSMETATARSAL AMPUTATION;  Surgeon: Newt Minion, MD;  Location: [place];  Service: Orthopedics;  Laterality: Right;   BIOPSY  05/27/2020   Procedure: BIOPSY;  Surgeon: [name], DO;  [facility];  Service: Endoscopy;;   CATARACT EXTRACTION, BILATERAL Bilateral ?2013  COLONOSCOPY W/ POLYPECTOMY     COLONOSCOPY WITH PROPOFOL N/A 03/13/2019   Procedure: COLONOSCOPY WITH PROPOFOL;  Surgeon: Jerene Bears, MD;  Location: [place];  Service: Gastroenterology;  Laterality: N/A;   [place]; 2007   "1 + 1"   ESOPHAGOGASTRODUODENOSCOPY (EGD) WITH PROPOFOL N/A 03/13/2019   Procedure: ESOPHAGOGASTRODUODENOSCOPY (EGD) WITH PROPOFOL;  Surgeon: Jerene Bears, MD;  Location: [place];  Service: Gastroenterology;  Laterality: N/A;   ESOPHAGOGASTRODUODENOSCOPY (EGD) WITH PROPOFOL N/A 05/27/2020   Procedure: ESOPHAGOGASTRODUODENOSCOPY (EGD) WITH PROPOFOL;  Surgeon: [name], DO;  [facility];  Service: Endoscopy;  Laterality: N/A;   EYE SURGERY Bilateral    lazer   HEMOSTASIS CLIP PLACEMENT  03/13/2019   Procedure: HEMOSTASIS CLIP PLACEMENT;  Surgeon: Jerene Bears, MD;  Location: [facility] ENDOSCOPY;  Service: Gastroenterology;;   KNEE ARTHROSCOPY Left 10/25/2006   POLYPECTOMY  03/13/2019   Procedure: POLYPECTOMY;  Surgeon: Jerene Bears, MD;  Location: [place];  Service: Gastroenterology;;   RIGHT HEART CATH N/A 07/24/2017   Procedure: RIGHT HEART CATH;  Surgeon: Jolaine Artist, MD;  Location: [place] [facility];  Service: Cardiovascular;  Laterality: N/A;   RIGHT HEART CATHETERIZATION N/A 09/22/2013   Procedure: RIGHT HEART CATH;  Surgeon: Jolaine Artist, MD;  Location: [facility] C[facility];  Service: Cardiovascular;  Laterality: N/A;   SHOULDER ARTHROSCOPY WITH OPEN ROTATOR CUFF REPAIR Right 03/14/2014   Procedure: RIGHT SHOULDER ARTHROSCOPY WITH BICEPS RELEASE, OPEN SUBSCAPULA REPAIR, OPEN SUPRASPINATUS REPAIR.;  Surgeon: Meredith Pel, MD;  Location: [place];  Service: Orthopedics;  Laterality: Right;   TOE AMPUTATION Right 02/24/2018   GREAT TOE AND 2ND TOE AMPUTATION   TUBAL LIGATION  1970's    Social History:  reports that she quit smoking about 24 years ago. Her smoking use included cigarettes. She has a 96.00 pack-year smoking history. She has never used smokeless tobacco. She reports that she does not currently use alcohol. She reports that she does not use drugs.   Allergies  Allergen Reactions   Keflex [Cephalexin] Diarrhea   Codeine Nausea And Vomiting and Other (See Comments)    Family History  Problem Relation Age of Onset   Heart attack Mother 2    Family history reviewed and not pertinent    Prior to Admission medications   Medication Sig Start Date End Date Taking? Authorizing Provider  albuterol (VENTOLIN HFA) 108 (90 Base) MCG/ACT inhaler Inhale 1-2 puffs into the lungs every 6 (six) hours as needed for wheezing or shortness of  breath.    [provider]  amiodarone (PACERONE) 200 MG tablet Take 1 tablet (200 mg total) by mouth daily. 01/29/22   Milford, Maricela Bo, FNP  apixaban (ELIQUIS) 5 MG TABS tablet Take 1 tablet (5 mg total) by mouth 2 (two) times daily. 01/29/22   Rafael Bihari, FNP  aspirin EC 81 MG tablet Take 1 tablet (81 mg total) by mouth daily with breakfast. 05/28/20   Emokpae, Courage, MD  atorvastatin (LIPITOR) 80 MG tablet TAKE 1 TABLET BY MOUTH EVERY DAY Patient taking differently: Take 80 mg by mouth daily. 09/20/21   Bensimhon, Shaune Pascal, MD  blood glucose meter kit and supplies Use up to four times daily as directed. 01/23/22   Ghimire, Henreitta Leber, MD  carvedilol (COREG) 6.25 MG tablet Take 1 tablet (6.25 mg total) by mouth 2 (two) times daily with a meal. 01/29/22   Milford, Maricela Bo, FNP  colchicine  0.6 MG tablet Take 0.6 mg by mouth daily as needed (gout).    [provider]  diphenoxylate-atropine (LOMOTIL) 2.5-0.025 MG tablet Take 1 tablet by mouth 4 (four) times daily as needed for diarrhea or loose stools. 08/19/21   [name], MD  ferrous sulfate 325 (65 FE) MG tablet Take 325 mg by mouth 2 (two) times daily.    [provider]  Fingerstix Lancets MISC Use as directed to check blood sugars as need up to 4 times daily 01/23/22   Ghimire, Henreitta Leber, MD  glucose blood test strip use as directed to check blood sugars up to 4 times daily 01/23/22   Ghimire, Henreitta Leber, MD  insulin aspart (NOVOLOG) 100 UNIT/ML FlexPen 0-9 Units, Subcutaneous, 3 times daily with meals. CBG 70-120: 0 units CBG 121-150: 1 unit CBG 151-200: 2 units CBG 201-250: 3 units CBG 251-300: 5 units CBG 301-350: 7 units CBG 351-400: 9 units CBG>400: call MD 01/23/22   Jonetta Osgood, MD  insulin detemir (LEVEMIR) 100 UNIT/ML FlexPen Inject 15 Units into the skin 2 (two) times daily. 01/23/22   Ghimire, Henreitta Leber, MD  Insulin Pen Needle 32G X 4 MM MISC Use as directed 01/23/22   Ghimire, Henreitta Leber, MD   isosorbide mononitrate (IMDUR) 30 MG 24 hr tablet Take 0.5 tablets (15 mg total) by mouth daily. 12/10/21   Milford, Maricela Bo, FNP  JANUVIA 25 MG tablet TAKE 1 TABLET (25 MG TOTAL) BY MOUTH DAILY. 01/31/22   [name], MD  levothyroxine (SYNTHROID, LEVOTHROID) 50 MCG tablet Take 1 tablet (50 mcg total) by mouth daily before breakfast. 11/07/16   Dena Billet B, PA-C  magnesium oxide (MAG-OX) 400 (240 Mg) MG tablet Take 1 tablet (400 mg total) by mouth daily. 07/18/21   [name], MD  metolazone (ZAROXOLYN) 2.5 MG tablet Take 1 tablet (2.5 mg total) by mouth as directed. Every other week 12/10/21 03/10/22  Rafael Bihari, FNP  nitroGLYCERIN (NITROSTAT) 0.4 MG SL tablet PLACE 1 TABLET (0.4 MG TOTAL) UNDER THE TONGUE EVERY 5 (FIVE) MINUTES AS NEEDED FOR CHEST PAIN. Patient not taking: Reported on 01/29/2022 02/06/21   Larey Dresser, MD  Nutritional Supplements (FRUIT & VEGETABLE DAILY PO) Take 1 tablet by mouth daily.    [provider]  nystatin (MYCOSTATIN/NYSTOP) powder Apply 1 Application topically 2 (two) times daily as needed for irritation. 10/07/21   [provider]  nystatin cream (MYCOSTATIN) Apply 1 Application topically 2 (two) times daily. 12/04/21   [provider]  OXYGEN Inhale 2 L/min into the lungs at bedtime.    [provider]  polyethylene glycol (MIRALAX / GLYCOLAX) 17 g packet Take 17 g by mouth as needed for mild constipation.    [provider]  pregabalin (LYRICA) 150 MG capsule Take 150 mg by mouth 2 (two) times daily. 03/20/21   [provider]  torsemide (DEMADEX) 20 MG tablet Take 4 tablets (80 mg total) by mouth 2 (two) times daily. 12/10/21   Milford, Maricela Bo, FNP  TRINTELLIX 10 MG TABS tablet TAKE 1 TABLET BY MOUTH EVERY DAY Patient taking differently: Take 10 mg by mouth daily. 10/22/21   [name], MD  ursodiol (ACTIGALL) 500 MG tablet Take 500 mg by mouth 3 (three) times daily. 12/11/20    [provider]     Objective    Physical Exam: Vitals:   01/31/22 2120 02/01/22 0214 02/01/22 0215 02/01/22 0445  BP: (!) 160/125 Marland Kitchen)  99/42  (!) 159/67  Pulse: 98 88 87 88  Resp: _0 Temp: 98.9 F (37.2 C) 98.7 F (37.1 C)    TempSrc: Oral Oral    SpO2: 95% 90% 94% 100%    General: appears to be stated age; alert, oriented Skin: warm, dry, no rash Head:  AT/[place] Mouth:  Oral mucosa membranes appear dry, normal dentition Neck: supple; trachea midline Heart:  RRR; did not appreciate any M/R/G Lungs: CTAB, did not appreciate any wheezes, rales, or rhonchi Abdomen: + BS; soft, ND, tenderness to palpation over the epigastrium as well as the bilateral upper abdominal quadrants, in the absence of any associated guarding, rigidity, or rebound tenderness. Vascular: 2+ pedal pulses b/l; 2+ radial pulses b/l Extremities: no peripheral edema, no muscle wasting Neuro: strength and sensation intact in upper and lower extremities b/l      Labs on Admission: I have personally reviewed following labs and imaging studies  CBC: Recent Labs  Lab 01/29/22 1147 01/31/22 2242  WBC 11.6* 20.1*  HGB 10.6* 11.5*  HCT 33.1* 35.9*  MCV 83.2 83.7  PLT 360 833   Basic Metabolic Panel: Recent Labs  Lab 01/29/22 1147 01/31/22 2242  NA 133* 135  K 3.6 4.5  CL 92* 93*  CO2 27 25  GLUCOSE 246* 287*  BUN 89* 95*  CREATININE 3.08* 3.69*  CALCIUM 8.6* 9.1   GFR: Estimated Creatinine Clearance: 17.1 mL/min (A) (by C-G formula based on SCr of 3.69 mg/dL (H)). Liver Function Tests: Recent Labs  Lab 01/31/22 2242  AST 242*  ALT 79*  ALKPHOS 404*  BILITOT 3.2*  PROT 6.7  ALBUMIN 2.8*   Recent Labs  Lab 01/31/22 2242  LIPASE 1,341*   No results for input(s): "AMMONIA" in the last 168 hours. Coagulation Profile: No results for input(s): "INR", "PROTIME" in the last 168 hours. Cardiac Enzymes: No results for input(s): "CKTOTAL", "CKMB", "CKMBINDEX", "TROPONINI"  in the last 168 hours. BNP (last 3 results) No results for input(s): "PROBNP" in the last 8760 hours. HbA1C: No results for input(s): "HGBA1C" in the last 72 hours. CBG: No results for input(s): "GLUCAP" in the last 168 hours. Lipid Profile: No results for input(s): "CHOL", "HDL", "LDLCALC", "TRIG", "CHOLHDL", "LDLDIRECT" in the last 72 hours. Thyroid Function Tests: No results for input(s): "TSH", "T4TOTAL", "FREET4", "T3FREE", "THYROIDAB" in the last 72 hours. Anemia Panel: No results for input(s): "VITAMINB12", "FOLATE", "FERRITIN", "TIBC", "IRON", "RETICCTPCT" in the last 72 hours. Urine analysis:    Component Value Date/Time   COLORURINE YELLOW 01/31/2022 0441   APPEARANCEUR HAZY (A) 01/31/2022 0441   LABSPEC 1.012 01/31/2022 0441   PHURINE 5.0 01/31/2022 0441   GLUCOSEU 50 (A) 01/31/2022 0441   HGBUR SMALL (A) 01/31/2022 0441   BILIRUBINUR NEGATIVE 01/31/2022 0441   BILIRUBINUR negative 06/05/2020 1334   KETONESUR NEGATIVE 01/31/2022 0441   PROTEINUR 30 (A) 01/31/2022 0441   UROBILINOGEN 0.2 06/05/2020 1334   UROBILINOGEN 0.2 04/01/2014 1659   NITRITE NEGATIVE 01/31/2022 0441   LEUKOCYTESUR NEGATIVE 01/31/2022 0441    Radiological Exams on Admission: VAS [name] UPPER EXTREMITY VENOUS DUPLEX  Result Date: 01/30/2022 UPPER VENOUS STUDY  Patient Name:  Susan [name]  Date of Exam:   01/30/2022 Medical Rec #: [id number]     Accession #:    [id number] Date of Birth: 1950/07/14     Patient Gender: F Patient Age:   50 years Exam Location:  [facility] Procedure:      VAS [name] UPPER EXTREMITY  VENOUS DUPLEX Referring Phys: [name] MILFORD --------------------------------------------------------------------------------  Indications: Swelling, and Pain Comparison Study: no prior Performing Technologist: Archie Patten RVS  Examination Guidelines: A complete evaluation includes B-mode imaging, spectral Doppler, color Doppler, and power Doppler as needed of all accessible portions of each  vessel. Bilateral testing is considered an integral part of a complete examination. Limited examinations for reoccurring indications may be performed as noted.  Right Findings: +----------+------------+---------+-----------+----------+-------+ RIGHT     CompressiblePhasicitySpontaneousPropertiesSummary +----------+------------+---------+-----------+----------+-------+ Subclavian               Yes       Yes                      +----------+------------+---------+-----------+----------+-------+  Left Findings: +----------+------------+---------+-----------+----------+-------+ LEFT      CompressiblePhasicitySpontaneousPropertiesSummary +----------+------------+---------+-----------+----------+-------+ IJV           Full       Yes       Yes                      +----------+------------+---------+-----------+----------+-------+ Subclavian    Full       Yes       Yes                      +----------+------------+---------+-----------+----------+-------+ Axillary      Full       Yes       Yes                      +----------+------------+---------+-----------+----------+-------+ Brachial      Full       Yes       Yes                      +----------+------------+---------+-----------+----------+-------+ Radial        Full                                          +----------+------------+---------+-----------+----------+-------+ Ulnar         Full                                          +----------+------------+---------+-----------+----------+-------+ Cephalic      Full                                          +----------+------------+---------+-----------+----------+-------+ Basilic       Full                                          +----------+------------+---------+-----------+----------+-------+  Summary:  Right: No evidence of thrombosis in the subclavian.  Left: No evidence of deep vein thrombosis in the upper extremity. No evidence of  superficial vein thrombosis in the upper extremity. Area of mixed echoes in the muscle fascia noted in the proximal upper arm. Etiology unknown. Further testing may be warranted.  *See table(s) above for measurements and observations.  Diagnosing physician: Jamelle Haring Electronically signed by Jamelle Haring on 01/30/2022 at 4:22:23 PM.    Final  Assessment/Plan   Principal Problem:   Acute pancreatitis Active Problems:   Hyperlipemia   Chronic HFrEF (heart failure with reduced ejection fraction) (HCC)   CKD (chronic kidney disease), stage IV (HCC)   Nausea & vomiting   Hypothyroidism   Type 2 diabetes mellitus with diabetic polyneuropathy, with long-term current use of insulin (HCC)   Abdominal pain   SIRS (systemic inflammatory response syndrome) (HCC)   Transaminitis   History of anemia due to chronic kidney disease   Paroxysmal atrial fibrillation (HCC)      #) Acute pancreatitis: Suspected diagnosis in the setting of 2 to 3 days of new onset epigastric discomfort radiating into the bilateral upper abdominal quadrants, associated with new onset nausea, vomiting, loose stool, with presenting lipase greater than 1300 relative to most recent prior value of 38 on 01/14/2022.  Of note, abdominal ultrasound ordered, with result currently pending.  In the setting of corresponding mild acute transaminitis with suggestion of cholestatic pattern on presenting liver enzymes, differential includes acute biliary pancreatitis versus acute cholecystitis with reactive acute pancreatitis. Will follow for aforementioned imaging results to help decipher between these possibilities.  Of note, the patient does have discomfort in the right upper quadrant, as well as mildly elevated total bilirubin, she does not appear jaundiced nor does she have an objective fever.  Therefore, clinically, criteria not met for a sending cholangitis at this time.  Criteria for Reynolds pentad also not currently met in the  absence of hypotension or altered mental status.  Consequently, and in the context of current hemodynamic stability, will refrain from initiation of IV antibiotics for now.   In terms of potential none biliary factors that may be contributing to her presenting acute pancreatitis, differential includes potential side effect of Januvia.  We will also check triglyceride level.  No evidence of hyper calcium Mia on presenting labs.  No recent trauma or abdominal surgery.  No known history of routine or recent alcohol consumption.  will add on serum ethanol level to further assess.   will maintain n.p.o. status for now as component of management of suspected acute pancreatitis.  However, relative to typical management of acute pancreatitis that would include IV fluid administration, will be conservative in this respect in the context of her history of chronic systolic heart failure with recent hospitalization that was complicated by acute on chronic systolic heart failure.  Additional supportive/symptomatic management, as further detailed below.    Plan: Follow-up results of abdominal ultrasound, as above.  NPO.  Prn IV fentanyl.  In the context of a history of QTc prolongation, we will proceed with as needed Ativan as antiemetic of choice.  Monitor strict I's and O's and daily weights.  Repeat CMP in the morning.  Check direct bilirubin as well as GGT.  Check INR.  Add on serum ethanol level as well as urinary drug screen.  Check lipid panel, with attention to triglyceride level.  Check LDH.  Add on serum magnesium level.  [facility] consulted, as above. Will utilize aforementioned imaging results and ensuing and from gastroenterology and/or general surgery to determine plan regarding anticoagulation in the setting of being on Eliquis for paroxysmal atrial fibrillation, with possibility of initiation of heparin drip without preceding bolus depending upon results of this imaging and surgical plan.            #) SIRS criteria present: Presentation notable for leukocytosis, present with cell count of 20,100 representing interval increase compared to 2 days ago, as well as  mild tachycardia.  However, no overt evidence of underlying infectious process at this time, including urinalysis that was inconsistent with UTI.  Additionally, clinical criteria for ascending cholangitis not met at this time.  will also further assess for underlying infection via chest x-ray.  As the patient's elevated white blood cell count is likely stemming from a strong contribution from reactive/inflammatory influences in the setting of suspected acute pancreatitis and mild tachycardia likely stems from influences from suboptimal pain control, will refrain from empiric initiation of IV antibiotics, particularly given the absence of overt underlying infectious process.  Of note, blood cultures x2 collected in the ED today.  Plan: Further evaluation management of presenting suspected acute pancreatitis/acute transaminitis with mild cholestatic pattern, as above.  Check chest x-ray.  Repeat CBC with differential in the morning.  Closely monitor for ensuing development of fever, which would likely be sufficient to justify initiation of antibiotics due to associated heightened concern for ascending cholangitis in tandem with a cholestatic pattern and right upper quadrant pain.  Add on procalcitonin level.               #) Chronic biventricular systolic heart failure: documented history of such, with most recent echocardiogram performed on 01/17/2022, which is notable for LVEF 30 to 35% as well as moderately reduced right ventricular systolic function, with additional details as conveyed above.  The biventricular nature of her heart failure renders of more delicate fluid balance, with potential for preload dependent pathophysiology , which is notable in the context of her presenting 2 to 3 days of increased GI losses  associated with reduction in oral intake over that timeframe .  Consequently, we will hold home torsemide for now, reevaluate volume status in the morning . no clinical evidence to suggest acutely decompensated heart failure at this time.  Home cardiac medications also include the following: Coreg.  Not on an ACE inhibitor or ARB in the setting of stage IV CKD.   Plan: monitor strict I's & O's and daily weights. Repeat CMP in AM. Check serum mag level.  Holding home diuretic regimen for now, with plan to reevaluate volume status in the morning, as above.  Holding home Coreg for now as well.  Add on BNP.  Follow-up result of chest x-ray, as above.          #) Poorly controlled type 2 diabetes mellitus, with most recent hemoglobin A1c noted to be greater than 87.8%, complicated by recent hospitalization involving DKA.  Insulin regimen includes Levemir 15 units SQ twice daily as well as sliding scale NovoLog 3 times daily with meals.  Presenting blood sugar found to be 287, without corresponding evidence of anion gap metabolic acidosis.  Her underlying diabetes is complicated by diabetic peripheral polyneuropathy, for which she is on Lyrica.  Will initiate approximately half of the patient's of basal insulin initially, as further quantified below.  Her outpatient diabetic regimen is also notable for Januvia, particularly in the context of suspected presenting acute pancreatitis, as above.  Plan: Levemir 7 units SQ twice daily, first dose now.  In the setting of current n.p.o. status, will pursue every 6 hours CBG monitoring with moderate dose sliding scale insulin.  Hold home [place] for now.  In the setting of current n.p.o. status, will also hold home Lyrica.          #) Stage IV CKD: Documented history of such, associated baseline creatinine range 3.0-3.8, presenting serum creatinine found to be consistent with this baseline range.  Plan : Monitor strict I's and O's and daily weights.   Tempt avoid nephrotoxic agents.  Repeat CMP in the morning.           #) Anemia of chronic kidney disease: Documented history of such, a/w with baseline hgb range 10-12, with presenting hgb consistent with this range, in the absence of any overt evidence of active bleed.  Presenting normocytic/normochromic findings also consistent with this underlying source of the patient's chronic anemia.   Plan: Repeat CBC in the morning.           #) Hyperlipidemia: documented h/o such. On high intensity atorvastatin as outpatient.  In the setting of presenting acute transaminitis as well as current n.p.o. status, will hold home statin for now.  Plan: Hold home statin.           #) acquired hypothyroidism: documented h/o such, on Synthroid as outpatient.   Plan: Hold home Synthroid in the setting of current n.p.o. status.  Check TSH.Marland Kitchen              #) Paroxysmal atrial fibrillation: Documented history of such. In setting of CHA2DS2-VASc score of 6, there is an indication for chronic anticoagulation for thromboembolic prophylaxis. Consistent with this, patient is chronically anticoagulated on Eliquis. Home AV nodal blocking regimen: Coreg.  Rhythm control strategy also pursued via oral amiodarone as outpatient.  Most recent echocardiogram, was performed on 01/17/2022, with details of the study as conveyed above.   Plan: monitor strict I's & O's and daily weights. Repeat CMP/CBC in AM. Check serum mag level. Continue home amiodarone.  Monitor on telemetry. Will follow for results of abdominal imaging as well as considering plan from surgical subspecialists to determine plan regarding anticoagulation, namely determination of resumption of Eliquis versus initiation of heparin drip, as above. Holding home Eliquis for now pending this reconciliation.      DVT prophylaxis: SCD's   Code Status: Full code Family Communication: none Disposition Plan: Per Rounding Team Consults  called: EDP has requested formal gastroenterology consult from on-call [facility], Dr. Havery Moros, As further detailed above;  Admission status: Inpatient    PLEASE NOTE THAT DRAGON DICTATION SOFTWARE WAS USED IN THE CONSTRUCTION OF THIS NOTE.   [name] Vining DO [facility]  From [place]   02/01/2022, 6:01 AM

## 2022-02-01 NOTE — ED Provider Notes (Signed)
Capillary refill takes less than 2 seconds.     Findings: No erythema or rash.  Neurological:     General: No focal deficit present.     Deep Tendon Reflexes: Reflexes normal.  Psychiatric:        Mood and Affect: Mood normal.     ED Results / Procedures / Treatments   Labs (all labs ordered are listed, but only abnormal results are displayed) Labs Reviewed  LIPASE, BLOOD - Abnormal; Notable for the following components:      Result Value   Lipase 1,341 (*)    All other components within normal limits  COMPREHENSIVE METABOLIC PANEL - Abnormal; Notable for the following components:   Chloride 93 (*)    Glucose, Bld 287 (*)    BUN 95 (*)    Creatinine, Ser 3.69 (*)    Albumin 2.8 (*)    AST 242 (*)    ALT 79 (*)    Alkaline Phosphatase 404 (*)    Total Bilirubin 3.2 (*)    GFR, Estimated 12 (*)    Anion gap 17 (*)    All other components within normal limits  CBC - Abnormal; Notable for the following components:   WBC 20.1 (*)    Hemoglobin 11.5 (*)    HCT 35.9 (*)    RDW 16.5 (*)    All other components within normal limits  CULTURE, BLOOD (ROUTINE X 2)  CULTURE, BLOOD (ROUTINE X 2)  URINALYSIS, ROUTINE W REFLEX MICROSCOPIC    EKG None  Radiology VAS [name] UPPER EXTREMITY VENOUS DUPLEX  Result Date: 01/30/2022 UPPER VENOUS STUDY  Patient Name:  Susan [name]  Date of Exam:   01/30/2022 Medical Rec #: [id number]     Accession #:    [id number] Date of Birth: 1951-02-25     Patient Gender: F Patient Age:   71 years Exam Location:   [facility] Procedure:      VAS [name] UPPER EXTREMITY VENOUS DUPLEX Referring Phys: [name] MILFORD --------------------------------------------------------------------------------  Indications: Swelling, and Pain Comparison Study: no prior Performing Technologist: Archie Patten RVS  Examination Guidelines: A complete evaluation includes B-mode imaging, spectral Doppler, color Doppler, and power Doppler as needed of all accessible portions of each vessel. Bilateral testing is considered an integral part of a complete examination. Limited examinations for reoccurring indications may be performed as noted.  Right Findings: +----------+------------+---------+-----------+----------+-------+ RIGHT     CompressiblePhasicitySpontaneousPropertiesSummary +----------+------------+---------+-----------+----------+-------+ Subclavian               Yes       Yes                      +----------+------------+---------+-----------+----------+-------+  Left Findings: +----------+------------+---------+-----------+----------+-------+ LEFT      CompressiblePhasicitySpontaneousPropertiesSummary +----------+------------+---------+-----------+----------+-------+ IJV           Full       Yes       Yes                      +----------+------------+---------+-----------+----------+-------+ Subclavian    Full       Yes       Yes                      +----------+------------+---------+-----------+----------+-------+ Axillary      Full       Yes       Yes                      +----------+------------+---------+-----------+----------+-------+  Capillary refill takes less than 2 seconds.     Findings: No erythema or rash.  Neurological:     General: No focal deficit present.     Deep Tendon Reflexes: Reflexes normal.  Psychiatric:        Mood and Affect: Mood normal.     ED Results / Procedures / Treatments   Labs (all labs ordered are listed, but only abnormal results are displayed) Labs Reviewed  LIPASE, BLOOD - Abnormal; Notable for the following components:      Result Value   Lipase 1,341 (*)    All other components within normal limits  COMPREHENSIVE METABOLIC PANEL - Abnormal; Notable for the following components:   Chloride 93 (*)    Glucose, Bld 287 (*)    BUN 95 (*)    Creatinine, Ser 3.69 (*)    Albumin 2.8 (*)    AST 242 (*)    ALT 79 (*)    Alkaline Phosphatase 404 (*)    Total Bilirubin 3.2 (*)    GFR, Estimated 12 (*)    Anion gap 17 (*)    All other components within normal limits  CBC - Abnormal; Notable for the following components:   WBC 20.1 (*)    Hemoglobin 11.5 (*)    HCT 35.9 (*)    RDW 16.5 (*)    All other components within normal limits  CULTURE, BLOOD (ROUTINE X 2)  CULTURE, BLOOD (ROUTINE X 2)  URINALYSIS, ROUTINE W REFLEX MICROSCOPIC    EKG None  Radiology VAS [name] UPPER EXTREMITY VENOUS DUPLEX  Result Date: 01/30/2022 UPPER VENOUS STUDY  Patient Name:  Susan [name]  Date of Exam:   01/30/2022 Medical Rec #: [id number]     Accession #:    [id number] Date of Birth: 1951-02-25     Patient Gender: F Patient Age:   71 years Exam Location:   [facility] Procedure:      VAS [name] UPPER EXTREMITY VENOUS DUPLEX Referring Phys: [name] MILFORD --------------------------------------------------------------------------------  Indications: Swelling, and Pain Comparison Study: no prior Performing Technologist: Archie Patten RVS  Examination Guidelines: A complete evaluation includes B-mode imaging, spectral Doppler, color Doppler, and power Doppler as needed of all accessible portions of each vessel. Bilateral testing is considered an integral part of a complete examination. Limited examinations for reoccurring indications may be performed as noted.  Right Findings: +----------+------------+---------+-----------+----------+-------+ RIGHT     CompressiblePhasicitySpontaneousPropertiesSummary +----------+------------+---------+-----------+----------+-------+ Subclavian               Yes       Yes                      +----------+------------+---------+-----------+----------+-------+  Left Findings: +----------+------------+---------+-----------+----------+-------+ LEFT      CompressiblePhasicitySpontaneousPropertiesSummary +----------+------------+---------+-----------+----------+-------+ IJV           Full       Yes       Yes                      +----------+------------+---------+-----------+----------+-------+ Subclavian    Full       Yes       Yes                      +----------+------------+---------+-----------+----------+-------+ Axillary      Full       Yes       Yes                      +----------+------------+---------+-----------+----------+-------+  Capillary refill takes less than 2 seconds.     Findings: No erythema or rash.  Neurological:     General: No focal deficit present.     Deep Tendon Reflexes: Reflexes normal.  Psychiatric:        Mood and Affect: Mood normal.     ED Results / Procedures / Treatments   Labs (all labs ordered are listed, but only abnormal results are displayed) Labs Reviewed  LIPASE, BLOOD - Abnormal; Notable for the following components:      Result Value   Lipase 1,341 (*)    All other components within normal limits  COMPREHENSIVE METABOLIC PANEL - Abnormal; Notable for the following components:   Chloride 93 (*)    Glucose, Bld 287 (*)    BUN 95 (*)    Creatinine, Ser 3.69 (*)    Albumin 2.8 (*)    AST 242 (*)    ALT 79 (*)    Alkaline Phosphatase 404 (*)    Total Bilirubin 3.2 (*)    GFR, Estimated 12 (*)    Anion gap 17 (*)    All other components within normal limits  CBC - Abnormal; Notable for the following components:   WBC 20.1 (*)    Hemoglobin 11.5 (*)    HCT 35.9 (*)    RDW 16.5 (*)    All other components within normal limits  CULTURE, BLOOD (ROUTINE X 2)  CULTURE, BLOOD (ROUTINE X 2)  URINALYSIS, ROUTINE W REFLEX MICROSCOPIC    EKG None  Radiology VAS [name] UPPER EXTREMITY VENOUS DUPLEX  Result Date: 01/30/2022 UPPER VENOUS STUDY  Patient Name:  Susan [name]  Date of Exam:   01/30/2022 Medical Rec #: [id number]     Accession #:    [id number] Date of Birth: 1951-02-25     Patient Gender: F Patient Age:   71 years Exam Location:   [facility] Procedure:      VAS [name] UPPER EXTREMITY VENOUS DUPLEX Referring Phys: [name] MILFORD --------------------------------------------------------------------------------  Indications: Swelling, and Pain Comparison Study: no prior Performing Technologist: Archie Patten RVS  Examination Guidelines: A complete evaluation includes B-mode imaging, spectral Doppler, color Doppler, and power Doppler as needed of all accessible portions of each vessel. Bilateral testing is considered an integral part of a complete examination. Limited examinations for reoccurring indications may be performed as noted.  Right Findings: +----------+------------+---------+-----------+----------+-------+ RIGHT     CompressiblePhasicitySpontaneousPropertiesSummary +----------+------------+---------+-----------+----------+-------+ Subclavian               Yes       Yes                      +----------+------------+---------+-----------+----------+-------+  Left Findings: +----------+------------+---------+-----------+----------+-------+ LEFT      CompressiblePhasicitySpontaneousPropertiesSummary +----------+------------+---------+-----------+----------+-------+ IJV           Full       Yes       Yes                      +----------+------------+---------+-----------+----------+-------+ Subclavian    Full       Yes       Yes                      +----------+------------+---------+-----------+----------+-------+ Axillary      Full       Yes       Yes                      +----------+------------+---------+-----------+----------+-------+  Capillary refill takes less than 2 seconds.     Findings: No erythema or rash.  Neurological:     General: No focal deficit present.     Deep Tendon Reflexes: Reflexes normal.  Psychiatric:        Mood and Affect: Mood normal.     ED Results / Procedures / Treatments   Labs (all labs ordered are listed, but only abnormal results are displayed) Labs Reviewed  LIPASE, BLOOD - Abnormal; Notable for the following components:      Result Value   Lipase 1,341 (*)    All other components within normal limits  COMPREHENSIVE METABOLIC PANEL - Abnormal; Notable for the following components:   Chloride 93 (*)    Glucose, Bld 287 (*)    BUN 95 (*)    Creatinine, Ser 3.69 (*)    Albumin 2.8 (*)    AST 242 (*)    ALT 79 (*)    Alkaline Phosphatase 404 (*)    Total Bilirubin 3.2 (*)    GFR, Estimated 12 (*)    Anion gap 17 (*)    All other components within normal limits  CBC - Abnormal; Notable for the following components:   WBC 20.1 (*)    Hemoglobin 11.5 (*)    HCT 35.9 (*)    RDW 16.5 (*)    All other components within normal limits  CULTURE, BLOOD (ROUTINE X 2)  CULTURE, BLOOD (ROUTINE X 2)  URINALYSIS, ROUTINE W REFLEX MICROSCOPIC    EKG None  Radiology VAS [name] UPPER EXTREMITY VENOUS DUPLEX  Result Date: 01/30/2022 UPPER VENOUS STUDY  Patient Name:  Susan [name]  Date of Exam:   01/30/2022 Medical Rec #: [id number]     Accession #:    [id number] Date of Birth: 1951-02-25     Patient Gender: F Patient Age:   71 years Exam Location:   [facility] Procedure:      VAS [name] UPPER EXTREMITY VENOUS DUPLEX Referring Phys: [name] MILFORD --------------------------------------------------------------------------------  Indications: Swelling, and Pain Comparison Study: no prior Performing Technologist: Archie Patten RVS  Examination Guidelines: A complete evaluation includes B-mode imaging, spectral Doppler, color Doppler, and power Doppler as needed of all accessible portions of each vessel. Bilateral testing is considered an integral part of a complete examination. Limited examinations for reoccurring indications may be performed as noted.  Right Findings: +----------+------------+---------+-----------+----------+-------+ RIGHT     CompressiblePhasicitySpontaneousPropertiesSummary +----------+------------+---------+-----------+----------+-------+ Subclavian               Yes       Yes                      +----------+------------+---------+-----------+----------+-------+  Left Findings: +----------+------------+---------+-----------+----------+-------+ LEFT      CompressiblePhasicitySpontaneousPropertiesSummary +----------+------------+---------+-----------+----------+-------+ IJV           Full       Yes       Yes                      +----------+------------+---------+-----------+----------+-------+ Subclavian    Full       Yes       Yes                      +----------+------------+---------+-----------+----------+-------+ Axillary      Full       Yes       Yes                      +----------+------------+---------+-----------+----------+-------+

## 2022-02-01 NOTE — ED Notes (Signed)
Extensive bruising noted to left arm from mid upper arm to wrist.

## 2022-02-02 ENCOUNTER — Inpatient Hospital Stay (HOSPITAL_COMMUNITY): Payer: HMO

## 2022-02-02 DIAGNOSIS — N184 Chronic kidney disease, stage 4 (severe): Secondary | ICD-10-CM

## 2022-02-02 DIAGNOSIS — R1013 Epigastric pain: Secondary | ICD-10-CM

## 2022-02-02 DIAGNOSIS — K859 Acute pancreatitis without necrosis or infection, unspecified: Secondary | ICD-10-CM | POA: Diagnosis not present

## 2022-02-02 DIAGNOSIS — I361 Nonrheumatic tricuspid (valve) insufficiency: Secondary | ICD-10-CM | POA: Diagnosis not present

## 2022-02-02 DIAGNOSIS — Z862 Personal history of diseases of the blood and blood-forming organs and certain disorders involving the immune mechanism: Secondary | ICD-10-CM

## 2022-02-02 DIAGNOSIS — N189 Chronic kidney disease, unspecified: Secondary | ICD-10-CM | POA: Diagnosis not present

## 2022-02-02 DIAGNOSIS — R112 Nausea with vomiting, unspecified: Secondary | ICD-10-CM

## 2022-02-02 DIAGNOSIS — R7881 Bacteremia: Secondary | ICD-10-CM | POA: Diagnosis not present

## 2022-02-02 DIAGNOSIS — K8500 Idiopathic acute pancreatitis without necrosis or infection: Secondary | ICD-10-CM | POA: Diagnosis not present

## 2022-02-02 DIAGNOSIS — E1142 Type 2 diabetes mellitus with diabetic polyneuropathy: Secondary | ICD-10-CM

## 2022-02-02 DIAGNOSIS — K851 Biliary acute pancreatitis without necrosis or infection: Secondary | ICD-10-CM | POA: Diagnosis not present

## 2022-02-02 DIAGNOSIS — E1122 Type 2 diabetes mellitus with diabetic chronic kidney disease: Secondary | ICD-10-CM | POA: Diagnosis not present

## 2022-02-02 DIAGNOSIS — I480 Paroxysmal atrial fibrillation: Secondary | ICD-10-CM

## 2022-02-02 DIAGNOSIS — R6510 Systemic inflammatory response syndrome (SIRS) of non-infectious origin without acute organ dysfunction: Secondary | ICD-10-CM | POA: Diagnosis not present

## 2022-02-02 DIAGNOSIS — I5022 Chronic systolic (congestive) heart failure: Secondary | ICD-10-CM | POA: Diagnosis not present

## 2022-02-02 DIAGNOSIS — Z87891 Personal history of nicotine dependence: Secondary | ICD-10-CM

## 2022-02-02 DIAGNOSIS — R7401 Elevation of levels of liver transaminase levels: Secondary | ICD-10-CM

## 2022-02-02 DIAGNOSIS — Z794 Long term (current) use of insulin: Secondary | ICD-10-CM

## 2022-02-02 DIAGNOSIS — E782 Mixed hyperlipidemia: Secondary | ICD-10-CM

## 2022-02-02 DIAGNOSIS — B952 Enterococcus as the cause of diseases classified elsewhere: Secondary | ICD-10-CM

## 2022-02-02 LAB — CBC
HCT: 29.2 % — ABNORMAL LOW (ref 36.0–46.0)
Hemoglobin: 9.3 g/dL — ABNORMAL LOW (ref 12.0–15.0)
MCH: 26.6 pg (ref 26.0–34.0)
MCHC: 31.8 g/dL (ref 30.0–36.0)
MCV: 83.4 fL (ref 80.0–100.0)
Platelets: 160 10*3/uL (ref 150–400)
RBC: 3.5 MIL/uL — ABNORMAL LOW (ref 3.87–5.11)
RDW: 17 % — ABNORMAL HIGH (ref 11.5–15.5)
WBC: 10.4 10*3/uL (ref 4.0–10.5)
nRBC: 0 % (ref 0.0–0.2)

## 2022-02-02 LAB — ECHOCARDIOGRAM COMPLETE
AR max vel: 1.96 cm2
AV Area VTI: 2.3 cm2
AV Area mean vel: 1.91 cm2
AV Mean grad: 3 mmHg
AV Peak grad: 6.2 mmHg
Ao pk vel: 1.24 m/s
Calc EF: 28.4 %
MV M vel: 2.52 m/s
MV Peak grad: 25.3 mmHg
S' Lateral: 5.1 cm
Single Plane A2C EF: 30.1 %
Single Plane A4C EF: 30.9 %

## 2022-02-02 LAB — LIPASE, BLOOD: Lipase: 60 U/L — ABNORMAL HIGH (ref 11–51)

## 2022-02-02 LAB — COMPREHENSIVE METABOLIC PANEL
ALT: 44 U/L (ref 0–44)
AST: 56 U/L — ABNORMAL HIGH (ref 15–41)
Albumin: 2.2 g/dL — ABNORMAL LOW (ref 3.5–5.0)
Alkaline Phosphatase: 237 U/L — ABNORMAL HIGH (ref 38–126)
Anion gap: 13 (ref 5–15)
BUN: 91 mg/dL — ABNORMAL HIGH (ref 8–23)
CO2: 25 mmol/L (ref 22–32)
Calcium: 8.3 mg/dL — ABNORMAL LOW (ref 8.9–10.3)
Chloride: 91 mmol/L — ABNORMAL LOW (ref 98–111)
Creatinine, Ser: 4.01 mg/dL — ABNORMAL HIGH (ref 0.44–1.00)
GFR, Estimated: 11 mL/min — ABNORMAL LOW (ref >60)
Glucose, Bld: 379 mg/dL — ABNORMAL HIGH (ref 70–99)
Potassium: 4.1 mmol/L (ref 3.5–5.1)
Sodium: 129 mmol/L — ABNORMAL LOW (ref 135–145)
Total Bilirubin: 0.9 mg/dL (ref 0.3–1.2)
Total Protein: 5.3 g/dL — ABNORMAL LOW (ref 6.5–8.1)

## 2022-02-02 LAB — GLUCOSE, CAPILLARY
Glucose-Capillary: 400 mg/dL — ABNORMAL HIGH (ref 70–99)
Glucose-Capillary: 222 mg/dL — ABNORMAL HIGH (ref 70–99)
Glucose-Capillary: 344 mg/dL — ABNORMAL HIGH (ref 70–99)
Glucose-Capillary: 331 mg/dL — ABNORMAL HIGH (ref 70–99)

## 2022-02-02 MED ORDER — VANCOMYCIN HCL 10 G IV SOLR
2500.0000 mg | Freq: Once | INTRAVENOUS | Status: AC
  Administered 2022-02-02: 2500 mg via INTRAVENOUS
  Filled 2022-02-02: qty 2500

## 2022-02-02 MED ORDER — ORAL CARE MOUTH RINSE: 15.0000 mL | OROMUCOSAL | Status: DC | PRN

## 2022-02-02 MED ORDER — LACTATED RINGERS IV SOLN: INTRAVENOUS | Status: DC

## 2022-02-02 MED ORDER — LOPERAMIDE HCL 2 MG PO CAPS
2.0000 mg | ORAL_CAPSULE | ORAL | Status: DC | PRN
  Administered 2022-02-02: 2 mg via ORAL
  Filled 2022-02-02: qty 1

## 2022-02-02 MED ORDER — VANCOMYCIN HCL 750 MG/150ML IV SOLN
750.0000 mg | INTRAVENOUS | Status: DC
  Administered 2022-02-04: 750 mg via INTRAVENOUS
  Filled 2022-02-02: qty 150

## 2022-02-02 NOTE — Progress Notes (Signed)
[facility] ANTIBIOTIC CONSULT NOTE   Susan [name] a 71 y.o. female admitted on 01/31/22 with epigastric pain and concern for acute pancreatitis. Found to have mixed e faecalis and e faecium bacteremia in 2/4 bottles, 2/2 blood culture sets. Pharmacy has been consulted for vancomycin dosing.  9/10: Scr 4.01, WBC 10.4   Estimated Creatinine Clearance: 15.7 mL/min (A) (by C-G formula based on SCr of 4.01 mg/dL (H)).  Plan: GIVE Vancomycin 2,500 mg IV x1 (Wt used: 107.2 kg) THEN Vancomycin 750 mg IV Q48H (Scr used: 4.01, Vd used: 0.5, eAUC: 491) Monitor renal function, clinical status, de-escalation, C/S, levels as indicated  F/U TTE, ERCP, GI/ID recs   Allergies:  Allergies  Allergen Reactions   Keflex [Cephalexin] Diarrhea   Codeine Nausea And Vomiting and Other (See Comments)    There were no vitals filed for this visit.     Latest Ref Rng & Units 02/02/2022    1:19 AM 02/01/2022    1:14 PM 02/01/2022    6:15 AM  CBC  WBC 4.0 - 10.5 K/uL 10.4  18.5  25.5   Hemoglobin 12.0 - 15.0 g/dL 9.3  9.5  9.7   Hematocrit 36.0 - 46.0 % 29.2  29.5  29.5   Platelets 150 - 400 K/uL 160  202  226     Antibiotics Given (last 72 hours)     Date/Time Action Medication Dose Rate   02/01/22 2251 New Bag/Given   ampicillin (OMNIPEN) 2 g in sodium chloride 0.9 % 100 mL IVPB 2 g 300 mL/hr   02/02/22 1123 New Bag/Given   ampicillin (OMNIPEN) 2 g in sodium chloride 0.9 % 100 mL IVPB 2 g 300 mL/hr       Antimicrobials this admission: Amp 9/9>>9/10 Vancomycin 9/10>>  Microbiology results: 9/8 Bcx 2/4 +  E faecalis/E Faecium no resistance   Thank you for allowing pharmacy to be a part of this patient's care.  Adria Dill, PharmD PGY-2 Infectious Diseases Resident  02/02/2022 2:39 PM

## 2022-02-02 NOTE — Progress Notes (Signed)
  Progress Note   Patient: Susan [name] JKD:[id number] DOB: [birth year] DOA: 01/31/2022     1 DOS: the patient was seen and examined on 02/02/2022   Brief hospital course: 09XI with hx systolic chf, DM, CKD 4, afib on eliquis presented with epigastric pain with n/v admitted for acute pancreatitis.  Assessment and Plan: #) Acute pancreatitis:  -Presenting lipase in excess of 1300  -RUQ [name] reviewed with cholelithiasis, full GB without evidence of acute cholecystitis and normal CBD -Follow up MRI reviewed. Findings of 17m stone in CBD, cholelithiasis without acute cholecystitis. Fluid signal lesion in inferior tip of spleen seen, likely benign. Can consider nonemergent abd MRI w/wo contast as outpatient later -Now on clears -Appreciate input by GI. Plan for ERCP early this week -Recheck lipase and LFT's in AM   #) Sepsis with enterococcus bacteremia -Presentation notable for leukocytosis, present with cell count of 20,100 representing interval increase compared to 2 days ago, as well as mild tachycardia.   -Blood cx pos for enterococcus -ID consulted. Recs to continue on vancomycin. F/u on TTE. Consideration for TEE if TTE is unrevealing   #) Chronic biventricular systolic heart failure: -holding off on diuretic for now while in hospital -Not on an ACE inhibitor or ARB in the setting of stage IV CKD. -BLE edema, mucus membranes not dry -hold further IVF, recheck bmet in AM    #) Poorly controlled type 2 diabetes mellitus - most recent hemoglobin A1c noted to be greater than 15.5% -cont on insulin as needed while in hospital -Pt on Januvia PTA. Given presenting pancreatitis, would avoid continuing moving forward   #) Stage IV CKD:  -renal function appears stable -Recheck bmet in AM   #) Anemia of chronic kidney disease:  -Hgb appears stable at this time -Remains hemodynamically stable -Repeat cbc in AM   #) Hyperlipidemia:  -statin currently on hold in setting of above   #)  acquired hypothyroidism:  -Cont synthroid per home regimen    #) Paroxysmal atrial fibrillation:  -CHA2DS2-VASc score of 6, on eliquis -Rate controlled, on amiodarone and coreg -anticoagulation currently on hold until decision made whether pt would require surgical/procedural intervention     Subjective: Complaining of increasing LE swelling  Physical Exam: Vitals:   02/02/22 0037 02/02/22 0303 02/02/22 0315 02/02/22 0739  BP: (!) 127/56 (!) 110/58 (!) 110/58 128/61  Pulse:  71 71 74  Resp:  '13 13 10  '$ Temp:  (!) 96.6 F (35.9 C) 98.2 F (36.8 C) 98 F (36.7 C)  TempSrc:  Oral  Oral  SpO2:    98%   General exam: Conversant, in no acute distress Respiratory system: normal chest rise, clear, no audible wheezing Cardiovascular system: regular rhythm, s1-s2 Gastrointestinal system: Nondistended, nontender, pos BS Central nervous system: No seizures, no tremors Extremities: No cyanosis, no joint deformities, BLE edema Skin: No rashes, no pallor Psychiatry: Affect normal // no auditory hallucinations    Data Reviewed:  Labs reviewed: Na 129, K 4.1, Cr 4.01  Family Communication: Pt in room, family not at bedside  Disposition: Status is: Inpatient Remains inpatient appropriate because: Severity of illness  Planned Discharge Destination: Home     Author: SMarylu Lund MD 02/02/2022 3:58 PM  For on call review [email or web address]

## 2022-02-02 NOTE — Plan of Care (Signed)
  Problem: Skin Integrity: Goal: Risk for impaired skin integrity will decrease Outcome: Progressing   Problem: Education: Goal: Knowledge of General Education information will improve Description: Including pain rating scale, medication(s)/side effects and non-pharmacologic comfort measures Outcome: Progressing   Problem: Health Behavior/Discharge Planning: Goal: Ability to manage health-related needs will improve Outcome: Progressing   Problem: Activity: Goal: Risk for activity intolerance will decrease Outcome: Progressing

## 2022-02-02 NOTE — Progress Notes (Addendum)
Progress Note  Primary GI: Dr. Benson Norway   Subjective  Chief Complaint: Abdominal pain  Patient lying in bed.  States she had 2 episodes of loose stools last night 1 episode of fecal incontinence. States for the last 6 months she has been having diarrhea about 20 minutes after eating.   States she is doing well with mild abdominal pain this morning appears to be more right lower quadrant, denies nausea vomiting. Denies fever or chills, hematochezia or melena.   Objective   Vital signs in last 24 hours: Temp:  [96.6 F (35.9 C)-98.9 F (37.2 C)] 98 F (36.7 C) (09/10 0739) Pulse Rate:  [71-89] 74 (09/10 0739) Resp:  [10-21] 10 (09/10 0739) BP: (93-128)/(54-105) 128/61 (09/10 0739) SpO2:  [96 %-99 %] 98 % (09/10 0739) Last BM Date : 02/02/22 Last BM recorded by nurses in past 5 days Stool Type: Type 7 (Liquid consistency with no solid pieces) (02/02/2022 12:01 AM)  General:   Awake, morbidly obese, chronically ill-appearing, poor dentition. Heart:  Regular rate and rhythm; Pulm: Clear anteriorly; no wheezing, on 2 L nasal cannula Abdomen:  Soft, Obese AB, Sluggish bowel sounds. mild tenderness in the epigastrium and in the RLQ. Without guarding and Without rebound, No organomegaly appreciated. Extremities:  with  edema. Neurologic:  Alert and  oriented x4;  No focal deficits.  Psych:  Cooperative. Normal mood and affect.  Intake/Output from previous day: 09/09 0701 - 09/10 0700 In: 711.6 [I.V.:211.6; IV Piggyback:500] Out: -  Intake/Output this shift: Total I/O In: -  Out: 500 [Urine:500]  Studies/Results: ECHOCARDIOGRAM COMPLETE  Result Date: 02/02/2022    ECHOCARDIOGRAM REPORT   Patient Name:   Susan [name] Date of Exam: 02/02/2022 Medical Rec #:  [id number]    Height:       66.0 in Accession #:    [id number]   Weight:       236.4 lb Date of Birth:  [birth year]    BSA:          2.147 m Patient Age:    34 years     BP:           128/61 mmHg Patient Gender: F            HR:            77 bpm. Exam Location:  Inpatient Procedure: 2D Echo, Color Doppler and Cardiac Doppler Indications:    Bacteremia  History:        Patient has prior history of Echocardiogram examinations, most                 recent 12/10/2021. CHF, CAD, Stroke, Arrythmias:RBBB,                 Signs/Symptoms:Syncope; Risk Factors:Dyslipidemia, Hypertension                 and Diabetes.  Sonographer:    Memory Argue Referring Phys: Haskins  1. Left ventricular ejection fraction, by estimation, is 30%. The left ventricle has moderately decreased function. The left ventricle demonstrates regional wall motion abnormalities (see scoring diagram/findings for description). The left ventricular internal cavity size was moderately dilated. Left ventricular diastolic parameters are indeterminate. There is the interventricular septum is flattened in diastole ('D' shaped left ventricle), consistent with right ventricular volume overload.  2. Right ventricular systolic function is low normal. The right ventricular size is normal. There is mildly elevated pulmonary artery systolic pressure. The estimated right ventricular  systolic pressure is 42.5 mmHg.  3. The mitral valve is abnormal. Trivial mitral valve regurgitation. Moderate mitral annular calcification.  4. Tricuspid valve regurgitation is severe.  5. The aortic valve is tricuspid. There is moderate calcification of the aortic valve. There is mild thickening of the aortic valve. Aortic valve regurgitation is not visualized. Comparison(s): Prior images reviewed side by side. Compared to prior; aortic valve is more thickened and calcified (2022 full study), LV has further dilatied, TR is severe. FINDINGS  Left Ventricle: Left ventricular ejection fraction, by estimation, is 30%. The left ventricle has moderately decreased function. The left ventricle demonstrates regional wall motion abnormalities. The left ventricular internal cavity size was  moderately  dilated. There is no left ventricular hypertrophy. The interventricular septum is flattened in diastole ('D' shaped left ventricle), consistent with right ventricular volume overload. Left ventricular diastolic parameters are indeterminate.  LV Wall Scoring: The inferior wall and apical septal segment are akinetic. The anterior septum and apical inferior segment are hypokinetic. Right Ventricle: The right ventricular size is normal. No increase in right ventricular wall thickness. Right ventricular systolic function is low normal. There is mildly elevated pulmonary artery systolic pressure. The tricuspid regurgitant velocity is 3.17 m/s, and with an assumed right atrial pressure of 3 mmHg, the estimated right ventricular systolic pressure is 95.6 mmHg. Left Atrium: Left atrial size was normal in size. Right Atrium: Right atrial size was normal in size. Pericardium: There is no evidence of pericardial effusion. Mitral Valve: The mitral valve is abnormal. Moderate mitral annular calcification. Trivial mitral valve regurgitation. Tricuspid Valve: The tricuspid valve is normal in structure. Tricuspid valve regurgitation is severe. Aortic Valve: The aortic valve is tricuspid. There is moderate calcification of the aortic valve. There is mild thickening of the aortic valve. There is moderate aortic valve annular calcification. Aortic valve regurgitation is not visualized. Aortic valve mean gradient measures 3.0 mmHg. Aortic valve peak gradient measures 6.2 mmHg. Aortic valve area, by VTI measures 2.30 cm. Pulmonic Valve: The pulmonic valve was normal in structure. Pulmonic valve regurgitation is mild. No evidence of pulmonic stenosis. Aorta: The aortic root is normal in size and structure and the ascending aorta was not well visualized. IAS/Shunts: No atrial level shunt detected by color flow Doppler.  LEFT VENTRICLE PLAX 2D LVIDd:         6.00 cm      Diastology LVIDs:         5.10 cm      LV e' medial:   4.90 cm/s LV PW:         0.90 cm      LV e' lateral: 10.70 cm/s LV IVS:        0.90 cm LVOT diam:     2.10 cm LV SV:         59 LV SV Index:   28 LVOT Area:     3.46 cm  LV Volumes (MOD) LV vol d, MOD A2C: 166.0 ml LV vol d, MOD A4C: 181.0 ml LV vol s, MOD A2C: 116.0 ml LV vol s, MOD A4C: 125.0 ml LV SV MOD A2C:     50.0 ml LV SV MOD A4C:     181.0 ml LV SV MOD BP:      50.1 ml RIGHT VENTRICLE RV S prime:     8.16 cm/s TAPSE (M-mode): 1.6 cm LEFT ATRIUM           Index        RIGHT ATRIUM  Index LA diam:      4.70 cm 2.19 cm/m   RA Area:     19.90 cm LA Vol (A4C): 64.6 ml 30.09 ml/m  RA Volume:   55.55 ml  25.87 ml/m  AORTIC VALVE AV Area (Vmax):    1.96 cm AV Area (Vmean):   1.91 cm AV Area (VTI):     2.30 cm AV Vmax:           124.00 cm/s AV Vmean:          87.300 cm/s AV VTI:            0.257 m AV Peak Grad:      6.2 mmHg AV Mean Grad:      3.0 mmHg LVOT Vmax:         70.20 cm/s LVOT Vmean:        48.200 cm/s LVOT VTI:          0.171 m LVOT/AV VTI ratio: 0.67  AORTA Ao Root diam: 2.70 cm MR Peak grad: 25.3 mmHg   TRICUSPID VALVE MR Vmax:      251.50 cm/s TR Peak grad:   40.2 mmHg                           TR Vmax:        317.00 cm/s                            SHUNTS                           Systemic VTI:  0.17 m                           Systemic Diam: 2.10 cm Rudean Haskell MD Electronically signed by Rudean Haskell MD Signature Date/Time: 02/02/2022/9:53:25 AM    Final    MR ABDOMEN MRCP WO CONTRAST  Result Date: 02/01/2022 CLINICAL DATA:  Right upper quadrant abdominal pain, pancreatitis suspected. EXAM: MRI ABDOMEN WITHOUT CONTRAST  (INCLUDING MRCP) TECHNIQUE: Multiplanar multisequence MR imaging of the abdomen was performed. Heavily T2-weighted images of the biliary and pancreatic ducts were obtained, and three-dimensional MRCP images were rendered by post processing. COMPARISON:  Ultrasound February 01, 2022, CT August 22, 2020 and MRI May 28, 2019. FINDINGS: Despite  efforts by the technologist and patient, motion artifact is present on today's exam and could not be eliminated. This reduces exam sensitivity and specificity. Lower chest: Heterogeneous signal in the lung bases commonly reflects atelectasis. Hepatobiliary: No hepatic steatosis. No suspicious hepatic lesion on this motion degraded examination. Cholelithiasis without findings of acute cholecystitis. Prominence of the biliary tree with the common duct measuring 8 mm and gentle tapering of the duct to the level of the ampulla. There is a 2 mm stone sitting in the dependent portion of the distal common duct on image 24/4. Pancreas: Mild fluid signal stranding at the pancreaticoduodenal groove. No pancreatic ductal dilation. Mild reduction in the normal hyperintense intrinsic T1 signal in the pancreatic head. Spleen: Well-circumscribed T2 hyperintense 3.9 by 1.6 cm lesion in the inferior tip of the spleen on image 14/11 is new from prior MRI and incompletely characterized on this examination with primary differential considerations a subcapsular splenic pseudocyst/fluid collection or less likely a benign lymphangioma. Adrenals/Urinary Tract: Bilateral adrenal glands appear normal. Bilateral fluid density renal  cysts are considered benign and require no independent imaging follow-up. Stomach/Bowel: Stomach is nondistended. No pathologic dilation or evidence of acute inflammation involving loops of large or small bowel in the abdomen. Colonic diverticulosis without findings of acute diverticulitis. Vascular/Lymphatic: Normal caliber abdominal aorta. No pathologically enlarged abdominal lymph nodes. Other:  No significant abdominal free fluid. Musculoskeletal: No suspicious bone lesions identified. IMPRESSION: 1. Prominence of the biliary tree with the common bile duct measuring 8 mm with a 2 mm stone in the dependent portion of the distal common duct and gentle tapering of the duct to the level of the ampulla. 2. Mild  fluid signal stranding at the pancreaticoduodenal groove with some loss of intrinsic T1 signal of the pancreatic head suggestive of acute interstitial pancreatitis. 3. Cholelithiasis without findings of acute cholecystitis. 4. Fluid signal lesion in the inferior tip of the spleen is nonspecific but statistically likely benign with primary differential considerations of a subcapsular splenic pseudocyst/fluid collection or less likely a benign lymphangioma. If clinically indicated consider more definitive characterization with nonemergent abdominal MRI with and without contrast preferably as an outpatient upon resolution patient's current symptomatology when they are able to follow commands including breath hold. Electronically Signed   By: Dahlia Bailiff M.D.   On: 02/01/2022 15:53   DG Chest Port 1 View  Result Date: 02/01/2022 CLINICAL DATA:  Abdominal pain, vomiting and diarrhea EXAM: PORTABLE CHEST 1 VIEW COMPARISON:  Prior chest x-ray 01/15/2022 FINDINGS: Stable mild cardiomegaly. Atherosclerotic calcifications present in the transverse aorta. Inspiratory volumes remain low with probable bibasilar atelectasis. Mild vascular congestion but no overt edema. Patchy airspace opacity in the left lung base partially obscures the hemidiaphragm. No pneumothorax. No acute osseous abnormality. IMPRESSION: 1. Patchy airspace opacity in the left lower lobe partially obscures the hemidiaphragm. This may reflect atelectasis or infiltrate. 2. Cardiomegaly with pulmonary vascular congestion but no overt edema. 3. Low inspiratory volumes, similar compared to prior. Electronically Signed   By: Jacqulynn Cadet M.D.   On: 02/01/2022 07:09   US Abdomen Limited  Result Date: 02/01/2022 CLINICAL DATA:  Assess for gallbladder symptoms. EXAM: ULTRASOUND ABDOMEN LIMITED RIGHT UPPER QUADRANT COMPARISON:  Abdominal CT 08/12/2021 FINDINGS: Gallbladder: Shadowing calculi in the gallbladder. Full gallbladder without wall thickening or  focal tenderness. Common bile duct: Diameter: 7-8 mm.  Where visualized, no filling defect. Liver: No focal lesion identified. Within normal limits in parenchymal echogenicity. Portal vein is patent on color Doppler imaging with normal direction of blood flow towards the liver. IMPRESSION: 1. Cholelithiasis. Full gallbladder without signs of acute cholecystitis. 2. Upper normal CBD diameter, please correlate with liver function tests. Electronically Signed   By: Jorje Guild M.D.   On: 02/01/2022 06:37    Lab Results: Recent Labs    02/01/22 0615 02/01/22 1314 02/02/22 0119  WBC 25.5* 18.5* 10.4  HGB 9.7* 9.5* 9.3*  HCT 29.5* 29.5* 29.2*  PLT 226 202 160   BMET Recent Labs    02/01/22 0615 02/01/22 1314 02/02/22 0121  NA 132* 135 129*  K 3.9 4.2 4.1  CL 90* 93* 91*  CO2 '26 30 25  '$ GLUCOSE 246* 136* 379*  BUN 94* 96* 91*  CREATININE 3.93* 4.03* 4.01*  CALCIUM 8.5* 8.5* 8.3*   LFT Recent Labs    02/01/22 0615 02/01/22 1314 02/02/22 0121  PROT 5.4*   < > 5.3*  ALBUMIN 2.4*   < > 2.2*  AST 114*   < > 56*  ALT 61*   < > 44  ALKPHOS 273*   < > 237*  BILITOT 1.0   < > 0.9  BILIDIR 0.4*  --   --    < > = values in this interval not displayed.   PT/INR Recent Labs    02/01/22 0726  LABPROT 17.9*  INR 1.5*      Patient profile:   71 year old obese female with poorly controlled diabetes hemoglobin A1c 15, stage IV chronic kidney disease, systolic heart failure, paroxysmal atrial fibrillation on Eliquis last dose 09/0 8 in the morning presents with abdominal pain. MRCP showed 2 mm stone dependent portion of the distal common duct and gentle tapering of the duct level of the ampulla, mild fluid signal pancreatitis.  Lipase initially 1000, today currently at 60.   Impression/Plan:   Choledocholithiasis with mild pancreatitis via MRCP, lipase at 60 WBC 10.4 HGB 9.3 Platelets 160 AST 56 ALT 44  Alkphos 237 TBili 0.9 02/01/2022 INR 1.5  01/15/2022 Lactic acid  3.2 Enterococcus faecalis bacteremia On ampicillin, down trending WBC, afebrile MRCP showed 2 mm stone dependent portion of the distal common duct and gentle tapering of the duct level of the ampulla, mild fluid signal pancreatitis. Patient will need ERCP, last dose eliquis 9/08 in the morning Discussed with Dr. Therisa Doyne, will defer ERCP for now. Continue patient on clear liquids.  Care to be resumed by Dr. Benson Norway Monday, he will determine timing of ERCP.   Diarrhea Appears to be more chronic over the last 6 months worse after food. Last episode this morning 1 AM brown, no hematochezia or melena. With severe diabetes possible EPI Consider Creon trial when patient begins to eat again/pancreatic elastase  Chronic HFrEF (heart failure with reduced ejection fraction) ([place]) Monitor closely    CKD (chronic kidney disease), stage IV ([place])   Type 2 diabetes mellitus with diabetic polyneuropathy, with long-term current use of insulin ([place]) Poorly controlled type 2 diabetes, DKA last hospital visit A1C greater than 15   History of anemia due to chronic kidney disease Overt signs of GI bleeding CBC on 02/02/2022    HGB 9.3 ( 9.7)  MCV 83.4  Anemia studies on 05/29/2019  Iron 20 Ferritin 187 B12 349   Paroxysmal atrial fibrillation ([name]) On Eliquis, last dose 9/08 in the morning     LOS: 1 day   Vladimir Crofts  02/02/2022, 10:11 AM   Attending physician's note   I have taken a history, reviewed the chart and examined the patient. I performed a substantive portion of this encounter, including complete performance of at least one of the key components, in conjunction with the APP. I agree with the APP's note, impression and recommendations.    Choledocholithiasis with mild biliary pancreatitis Continue clear liquids as tolerated  Hemodynamically stable with mild leukocytosis Enterococcus faecalis bacteremia On broad-spectrum antibiotics  Pain control per primary team  Discussed with Dr.  Therisa Doyne, on-call ERCP backup for the weekend.  Plan to defer ERCP for now  Dr. Benson Norway will resume care and round on patient tomorrow and will decide on timing of ERCP  Please call GI with any change in clinical status or questions  The patient was provided an opportunity to ask questions and all were answered. The patient agreed with the plan and demonstrated an understanding of the instructions.   Damaris Hippo , MD [phone]

## 2022-02-02 NOTE — Consult Note (Addendum)
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  Principal Problem:   Acute pancreatitis Active Problems:   Hyperlipemia   Chronic HFrEF (heart failure with reduced ejection fraction) (HCC)   CKD (chronic kidney disease), stage IV (HCC)   Nausea & vomiting   Hypothyroidism   Type 2 diabetes mellitus with diabetic polyneuropathy, with long-term current use of insulin (HCC)   Abdominal pain   SIRS (systemic inflammatory response syndrome) (HCC)   Transaminitis   History of anemia due to chronic kidney disease   Paroxysmal atrial fibrillation (HCC)   Scheduled Meds:  amiodarone  200 mg Oral Daily   carvedilol  6.25 mg Oral BID WC   insulin aspart  0-9 Units Subcutaneous Q6H   insulin detemir  7 Units Subcutaneous BID   levothyroxine  50 mcg Oral QAC breakfast   Continuous Infusions:  ampicillin (OMNIPEN) IV 2 g (02/01/22 2251)   lactated ringers 75 mL/hr at 02/02/22 0838   PRN Meds:.acetaminophen **OR** acetaminophen, fentaNYL (SUBLIMAZE) injection, LORazepam, naLOXone (NARCAN)  injection, mouth rinse  HPI: Susan [name] is a 71 y.o. female with multiple  medical problems including diabetes mellitus that is poorly controlled and required amputation of her toes on the right, chronic kidney disease heart failure paroxysmal atrial fibrillation who had been recently admitted for diabetic ketoacidosis in August complicated by heart failure and atrial fibrillation with rapid ventricular response.  She returned to the hospital complaining of 3 days of epigastric pain was sharp and rating to all quadrants with nausea followed by nonbilious nonbloody emesis.  She is also been suffering from diarrhea and not eating much food.  She has been simply found to have gallstone pancreatitis with a stone in the dependent portion of the common bile duct at level of the ampulla.  GI are following and considering ERCP though they consider her high risk for this procedure.  She had blood cultures taken in the ER and one of the 2  sites initially growing organisms but now BOTH growing [place] with BCID ID not [place] but also E FAECIUM being ID. Case discussed with Adria Dill and given concern for AMP R will switch to vancomycin. Lucianne Lei A gene not detected so organism should be Vancomycin S.  I will repeat blood cultures tomorrow   I spent 83 minutes with the patient including than 50% of the time in face to face counseling of the patient already undergone her enterococcal bacteremia her gallstone pancreatitis, personally reviewing CT abdomen pelvis MRCP along with review of medical records in preparation for the visit and during the visit and in coordination of her care.    Review of Systems: Review of Systems  Constitutional:  Positive for malaise/fatigue and weight loss. Negative for chills and fever.  HENT:  Negative for congestion and sore throat.   Eyes:  Negative for blurred vision and photophobia.  Respiratory:  Negative for cough, shortness of breath and wheezing.   Cardiovascular:  Negative for chest pain, palpitations and leg swelling.   Gastrointestinal:  Positive for abdominal pain, diarrhea, nausea and vomiting. Negative for blood in stool, constipation, heartburn and melena.  Genitourinary:  Negative for dysuria, flank pain and hematuria.  Musculoskeletal:  Negative for back pain, falls, joint pain and myalgias.  Skin:  Negative for itching and rash.  Neurological:  Positive for weakness. Negative for dizziness, focal weakness, loss of consciousness and headaches.  Endo/Heme/Allergies:  Does not bruise/bleed easily.  Psychiatric/Behavioral:  Negative for depression and suicidal ideas. The patient does not have insomnia.     Past Medical History:  Diagnosis Date   Acute MI ([place]) 1999; 2007   Anemia    hx   Anginal pain ([place])    Anxiety    ARF (acute renal failure) ([place]) 06/2017   [facility]   Arthritis    "generalized" (03/15/2014)   CAD (coronary artery disease)    MI in 2000 - MI  2007 - treated bare metal stent (no nuclear since then as 9/11)   Carotid artery disease (HCC)    CHF (congestive heart failure) ([place])    Chronic diastolic heart failure ([place])    a) ECHO (08/2013) EF 55-60% and RV function nl b) RHC (08/2013) RA 4, RV 30/5/7, PA 25/10 (16), PCWP 7, Fick CO/CI 6.3/2.7, PVR 1.5 WU, PA 61 and 66%   Daily headache    "~ every other day; since I fell in June" (03/15/2014)   Depression    Dyslipidemia    Dyspnea    Exertional shortness of breath    HTN (hypertension)    Hypothyroidism    Neuropathy    Obesity    Osteoarthritis    Peripheral neuropathy    PONV (postoperative nausea and vomiting)    RBBB (right bundle branch block)    Old   Stroke ([place])    mini strokes   Syncope    likely due to low blood sugar   Tachycardia    Sinus tachycardia   Type II diabetes mellitus (HCC)    Type II   Urinary incontinence    Venous insufficiency     Social History   Tobacco Use   Smoking status: Former    Packs/day: 3.00    Years: 32.00    Total pack years: 96.00    Types: Cigarettes     Quit date: 10/24/1997    Years since quitting: 24.2   Smokeless tobacco: Never  Vaping Use   Vaping Use: Never used  Substance Use Topics   Alcohol use: Not Currently    Comment: "might have 2-3 daiquiris in the summer"   Drug use: No    Family History  Problem Relation Age of Onset   Heart attack Mother 3   Allergies  Allergen Reactions   Keflex [Cephalexin] Diarrhea   Codeine Nausea And Vomiting and Other (See Comments)    OBJECTIVE: Blood pressure 128/61, pulse 74, temperature 98 F (36.7 C), temperature source Oral, resp. rate 10, SpO2 98 %.  Physical Exam Constitutional:      General: She is not in acute distress.    Appearance: Normal appearance. She is ill-appearing.  HENT:     Head: Normocephalic and atraumatic.  Right Ear: Hearing and external ear normal.     Left Ear: Hearing and external ear normal.     Nose: No nasal deformity or rhinorrhea.  Eyes:     General: No scleral icterus.    Conjunctiva/sclera: Conjunctivae normal.     Right eye: Right conjunctiva is not injected.     Left eye: Left conjunctiva is not injected.     Pupils: Pupils are equal, round, and reactive to light.  Neck:     Vascular: No JVD.  Cardiovascular:     Rate and Rhythm: Tachycardia present. Rhythm irregular.     Heart sounds: Normal heart sounds, S1 normal and S2 normal. No murmur heard.    No friction rub. No gallop.  Pulmonary:     Effort: Pulmonary effort is normal. No respiratory distress.     Breath sounds: Normal breath sounds. No stridor. No wheezing.  Abdominal:     General: Bowel sounds are normal.     Palpations: Abdomen is soft.     Tenderness: There is abdominal tenderness.  Musculoskeletal:        General: Normal range of motion.     Right shoulder: Normal.     Left shoulder: Normal.     Cervical back: Normal range of motion and neck supple.     Right hip: Normal.     Left hip: Normal.     Right knee: Normal.     Left knee: Normal.  Lymphadenopathy:      Head:     Right side of head: No submandibular, preauricular or posterior auricular adenopathy.     Left side of head: No submandibular, preauricular or posterior auricular adenopathy.     Cervical: No cervical adenopathy.     Right cervical: No superficial or deep cervical adenopathy.    Left cervical: No superficial or deep cervical adenopathy.  Skin:    General: Skin is warm and dry.     Coloration: Skin is not pale.     Findings: Bruising present. No abrasion, ecchymosis, erythema, lesion or rash.     Nails: There is no clubbing.  Neurological:     General: No focal deficit present.     Mental Status: She is alert and oriented to person, place, and time.     Sensory: No sensory deficit.     Coordination: Coordination normal.     Gait: Gait normal.  Psychiatric:        Attention and Perception: Attention normal. She is attentive.        Mood and Affect: Mood is anxious.        Speech: Speech normal.        Behavior: Behavior normal. Behavior is cooperative.        Thought Content: Thought content normal.        Cognition and Memory: Cognition and memory normal.        Judgment: Judgment normal.   Venous stasis changes    Right foot where she has had amputation     Left foot with mycosis but no diabetic foot ulcers     Lab Results Lab Results  Component Value Date   WBC 10.4 02/02/2022   HGB 9.3 (L) 02/02/2022   HCT 29.2 (L) 02/02/2022   MCV 83.4 02/02/2022   PLT 160 02/02/2022    Lab Results  Component Value Date   CREATININE 4.01 (H) 02/02/2022   BUN 91 (H) 02/02/2022   NA 129 (L) 02/02/2022   K 4.1 02/02/2022  CL 91 (L) 02/02/2022   CO2 25 02/02/2022    Lab Results  Component Value Date   ALT 44 02/02/2022   AST 56 (H) 02/02/2022   GGT 144 (H) 02/01/2022   ALKPHOS 237 (H) 02/02/2022   BILITOT 0.9 02/02/2022     Microbiology: Recent Results (from the past 240 hour(s))  Blood culture (routine x 2)     Status: None (Preliminary result)    Collection Time: 02/01/22  5:00 AM   Specimen: BLOOD RIGHT HAND  Result Value Ref Range Status   Specimen Description BLOOD RIGHT HAND  Final   Special Requests   Final    BOTTLES DRAWN AEROBIC AND ANAEROBIC Blood Culture results may not be optimal due to an inadequate volume of blood received in culture bottles   Culture   Final    NO GROWTH 1 DAY Performed at [facility], [place] [address], [place], [place] [address]    Report Status PENDING  Incomplete  Blood culture (routine x 2)     Status: None (Preliminary result)   Collection Time: 02/01/22  5:00 AM   Specimen: BLOOD RIGHT ARM  Result Value Ref Range Status   Specimen Description BLOOD RIGHT ARM  Final   Special Requests   Final    BOTTLES DRAWN AEROBIC AND ANAEROBIC Blood Culture results may not be optimal due to an inadequate volume of blood received in culture bottles   Culture  Setup Time   Final    GRAM POSITIVE COCCI IN CHAINS ANAEROBIC BOTTLE ONLY CRITICAL RESULT CALLED TO, READ BACK BY AND VERIFIED WITH:  C/ PHARMD S. WATSON 02/01/22 0924 A. LAFRANCE     Culture   Final    GRAM POSITIVE COCCI TOO YOUNG TO READ Performed at [facility], [place] [address] [place], [place] [address]    Report Status PENDING  Incomplete  Blood Culture ID Panel (Reflexed)     Status: Abnormal   Collection Time: 02/01/22  5:00 AM  Result Value Ref Range Status   Enterococcus faecalis DETECTED (A) NOT DETECTED Final    Comment: CRITICAL RESULT CALLED TO, READ BACK BY AND VERIFIED WITH:  C/ PHARMD S. WATSON 02/01/22 0924 A. LAFRANCE     Enterococcus Faecium DETECTED (A) NOT DETECTED Final    Comment: CRITICAL RESULT CALLED TO, READ BACK BY AND VERIFIED WITH:  C/ PHARMD S. WATSON 02/01/22 0924 A. LAFRANCE     Listeria monocytogenes NOT DETECTED NOT DETECTED Final   Staphylococcus species NOT DETECTED NOT DETECTED Final   Staphylococcus aureus (BCID) NOT DETECTED NOT DETECTED Final   Staphylococcus epidermidis NOT DETECTED  NOT DETECTED Final   Staphylococcus lugdunensis NOT DETECTED NOT DETECTED Final   Streptococcus species NOT DETECTED NOT DETECTED Final   Streptococcus agalactiae NOT DETECTED NOT DETECTED Final   Streptococcus pneumoniae NOT DETECTED NOT DETECTED Final   Streptococcus pyogenes NOT DETECTED NOT DETECTED Final   A.calcoaceticus-baumannii NOT DETECTED NOT DETECTED Final   Bacteroides fragilis NOT DETECTED NOT DETECTED Final   Enterobacterales NOT DETECTED NOT DETECTED Final   Enterobacter cloacae complex NOT DETECTED NOT DETECTED Final   Escherichia coli NOT DETECTED NOT DETECTED Final   Klebsiella aerogenes NOT DETECTED NOT DETECTED Final   Klebsiella oxytoca NOT DETECTED NOT DETECTED Final   Klebsiella pneumoniae NOT DETECTED NOT DETECTED Final   Proteus species NOT DETECTED NOT DETECTED Final   Salmonella species NOT DETECTED NOT DETECTED Final   Serratia marcescens NOT DETECTED NOT DETECTED Final  Haemophilus influenzae NOT DETECTED NOT DETECTED Final   Neisseria meningitidis NOT DETECTED NOT DETECTED Final   Pseudomonas aeruginosa NOT DETECTED NOT DETECTED Final   Stenotrophomonas maltophilia NOT DETECTED NOT DETECTED Final   Candida albicans NOT DETECTED NOT DETECTED Final   Candida auris NOT DETECTED NOT DETECTED Final   Candida glabrata NOT DETECTED NOT DETECTED Final   Candida krusei NOT DETECTED NOT DETECTED Final   Candida parapsilosis NOT DETECTED NOT DETECTED Final   Candida tropicalis NOT DETECTED NOT DETECTED Final   Cryptococcus neoformans/gattii NOT DETECTED NOT DETECTED Final   Vancomycin resistance NOT DETECTED NOT DETECTED Final    Comment: Performed at [facility], [place] [address] [place], [place] [address]    Alcide Evener, [place] [facility] [phone] pager  02/02/2022, 11:20 AM

## 2022-02-03 ENCOUNTER — Inpatient Hospital Stay: Payer: HMO | Admitting: Family Medicine

## 2022-02-03 ENCOUNTER — Inpatient Hospital Stay (HOSPITAL_COMMUNITY): Payer: HMO | Admitting: Certified Registered Nurse Anesthetist

## 2022-02-03 ENCOUNTER — Encounter (HOSPITAL_COMMUNITY): Payer: Self-pay | Admitting: Internal Medicine

## 2022-02-03 ENCOUNTER — Inpatient Hospital Stay (HOSPITAL_COMMUNITY): Payer: HMO

## 2022-02-03 ENCOUNTER — Encounter (HOSPITAL_COMMUNITY)
Admission: EM | Disposition: A | Discharge status: Home / Self Care | Payer: Self-pay | Source: Home / Self Care | Attending: Internal Medicine

## 2022-02-03 ENCOUNTER — Ambulatory Visit: Payer: HMO | Admitting: Orthopedic Surgery

## 2022-02-03 DIAGNOSIS — I5022 Chronic systolic (congestive) heart failure: Secondary | ICD-10-CM | POA: Diagnosis not present

## 2022-02-03 DIAGNOSIS — K851 Biliary acute pancreatitis without necrosis or infection: Secondary | ICD-10-CM | POA: Diagnosis not present

## 2022-02-03 DIAGNOSIS — I110 Hypertensive heart disease with heart failure: Secondary | ICD-10-CM

## 2022-02-03 DIAGNOSIS — Z87891 Personal history of nicotine dependence: Secondary | ICD-10-CM

## 2022-02-03 DIAGNOSIS — K859 Acute pancreatitis without necrosis or infection, unspecified: Secondary | ICD-10-CM | POA: Diagnosis not present

## 2022-02-03 DIAGNOSIS — K8050 Calculus of bile duct without cholangitis or cholecystitis without obstruction: Secondary | ICD-10-CM

## 2022-02-03 DIAGNOSIS — I509 Heart failure, unspecified: Secondary | ICD-10-CM | POA: Diagnosis not present

## 2022-02-03 DIAGNOSIS — N184 Chronic kidney disease, stage 4 (severe): Secondary | ICD-10-CM | POA: Diagnosis not present

## 2022-02-03 HISTORY — PX: REMOVAL OF STONES: SHX5545

## 2022-02-03 HISTORY — PX: SPHINCTEROTOMY: SHX5544

## 2022-02-03 HISTORY — PX: ERCP: SHX5425

## 2022-02-03 LAB — COMPREHENSIVE METABOLIC PANEL
ALT: 33 U/L (ref 0–44)
AST: 27 U/L (ref 15–41)
Albumin: 2.1 g/dL — ABNORMAL LOW (ref 3.5–5.0)
Alkaline Phosphatase: 205 U/L — ABNORMAL HIGH (ref 38–126)
Anion gap: 13 (ref 5–15)
BUN: 85 mg/dL — ABNORMAL HIGH (ref 8–23)
CO2: 24 mmol/L (ref 22–32)
Calcium: 8.3 mg/dL — ABNORMAL LOW (ref 8.9–10.3)
Chloride: 96 mmol/L — ABNORMAL LOW (ref 98–111)
Creatinine, Ser: 3.71 mg/dL — ABNORMAL HIGH (ref 0.44–1.00)
GFR, Estimated: 12 mL/min — ABNORMAL LOW (ref >60)
Glucose, Bld: 149 mg/dL — ABNORMAL HIGH (ref 70–99)
Potassium: 4 mmol/L (ref 3.5–5.1)
Sodium: 133 mmol/L — ABNORMAL LOW (ref 135–145)
Total Bilirubin: 0.9 mg/dL (ref 0.3–1.2)
Total Protein: 5.2 g/dL — ABNORMAL LOW (ref 6.5–8.1)

## 2022-02-03 LAB — GLUCOSE, CAPILLARY
Glucose-Capillary: 151 mg/dL — ABNORMAL HIGH (ref 70–99)
Glucose-Capillary: 165 mg/dL — ABNORMAL HIGH (ref 70–99)
Glucose-Capillary: 221 mg/dL — ABNORMAL HIGH (ref 70–99)
Glucose-Capillary: 238 mg/dL — ABNORMAL HIGH (ref 70–99)
Glucose-Capillary: 165 mg/dL — ABNORMAL HIGH (ref 70–99)

## 2022-02-03 LAB — CBC
HCT: 28.1 % — ABNORMAL LOW (ref 36.0–46.0)
Hemoglobin: 9.1 g/dL — ABNORMAL LOW (ref 12.0–15.0)
MCH: 27.2 pg (ref 26.0–34.0)
MCHC: 32.4 g/dL (ref 30.0–36.0)
MCV: 84.1 fL (ref 80.0–100.0)
Platelets: 158 10*3/uL (ref 150–400)
RBC: 3.34 MIL/uL — ABNORMAL LOW (ref 3.87–5.11)
RDW: 16.8 % — ABNORMAL HIGH (ref 11.5–15.5)
WBC: 6.1 10*3/uL (ref 4.0–10.5)
nRBC: 0 % (ref 0.0–0.2)

## 2022-02-03 SURGERY — ERCP, WITH INTERVENTION IF INDICATED
Anesthesia: General

## 2022-02-03 MED ORDER — ETOMIDATE 2 MG/ML IV SOLN
INTRAVENOUS | Status: DC | PRN
  Administered 2022-02-03: 20 mg via INTRAVENOUS

## 2022-02-03 MED ORDER — ROCURONIUM BROMIDE 10 MG/ML (PF) SYRINGE
PREFILLED_SYRINGE | INTRAVENOUS | Status: DC | PRN
  Administered 2022-02-03: 50 mg via INTRAVENOUS

## 2022-02-03 MED ORDER — ALPRAZOLAM 0.5 MG PO TABS
0.5000 mg | ORAL_TABLET | Freq: Once | ORAL | Status: AC
  Administered 2022-02-03: 0.5 mg via ORAL
  Filled 2022-02-03: qty 1

## 2022-02-03 MED ORDER — SUGAMMADEX SODIUM 200 MG/2ML IV SOLN
INTRAVENOUS | Status: DC | PRN
  Administered 2022-02-03: 200 mg via INTRAVENOUS

## 2022-02-03 MED ORDER — LIDOCAINE 2% (20 MG/ML) 5 ML SYRINGE
INTRAMUSCULAR | Status: DC | PRN
  Administered 2022-02-03: 50 mg via INTRAVENOUS

## 2022-02-03 MED ORDER — DEXAMETHASONE SODIUM PHOSPHATE 10 MG/ML IJ SOLN
INTRAMUSCULAR | Status: DC | PRN
  Administered 2022-02-03: 4 mg via INTRAVENOUS

## 2022-02-03 MED ORDER — SODIUM CHLORIDE 0.9 % IV SOLN: INTRAVENOUS | Status: DC

## 2022-02-03 MED ORDER — LACTATED RINGERS IV SOLN: INTRAVENOUS | Status: DC | PRN

## 2022-02-03 MED ORDER — DICLOFENAC SUPPOSITORY 100 MG
RECTAL | Status: AC
  Filled 2022-02-03: qty 1

## 2022-02-03 MED ORDER — ONDANSETRON HCL 4 MG/2ML IJ SOLN
INTRAMUSCULAR | Status: DC | PRN
  Administered 2022-02-03: 4 mg via INTRAVENOUS

## 2022-02-03 MED ORDER — FENTANYL CITRATE (PF) 100 MCG/2ML IJ SOLN
INTRAMUSCULAR | Status: AC
  Filled 2022-02-03: qty 2

## 2022-02-03 MED ORDER — PHENYLEPHRINE HCL-NACL 20-0.9 MG/250ML-% IV SOLN
INTRAVENOUS | Status: DC | PRN
  Administered 2022-02-03: 25 ug/min via INTRAVENOUS

## 2022-02-03 MED ORDER — FENTANYL CITRATE (PF) 250 MCG/5ML IJ SOLN
INTRAMUSCULAR | Status: DC | PRN
  Administered 2022-02-03: 50 ug via INTRAVENOUS

## 2022-02-03 MED ORDER — GLUCAGON HCL RDNA (DIAGNOSTIC) 1 MG IJ SOLR
INTRAMUSCULAR | Status: DC | PRN
  Administered 2022-02-03 (×2): .5 mg via INTRAVENOUS

## 2022-02-03 MED ORDER — SODIUM CHLORIDE 0.9 % IV SOLN
INTRAVENOUS | Status: DC | PRN
  Administered 2022-02-03: 25 mL

## 2022-02-03 NOTE — Interval H&P Note (Signed)
History and Physical Interval Note:  02/03/2022 1:09 PM  Susan [name]  has presented today for surgery, with the diagnosis of CBD stones.  The various methods of treatment have been discussed with the patient and family. After consideration of risks, benefits and other options for treatment, the patient has consented to  Procedure(s): ENDOSCOPIC RETROGRADE CHOLANGIOPANCREATOGRAPHY (ERCP) (N/A) as a surgical intervention.  The patient's history has been reviewed, patient examined, no change in status, stable for surgery.  I have reviewed the patient's chart and labs.  Questions were answered to the patient's satisfaction.     Kingston Guiles D

## 2022-02-03 NOTE — TOC Progression Note (Signed)
Transition of Care [facility]) - Progression Note    Patient Details  Name: Susan [name] MRN: [id number] Date of Birth: Apr 22, 1951  Transition of Care [facility] CM/SW Contact  Zenon Mayo, RN Phone Number: 02/03/2022, 5:03 PM  Clinical Narrative:     from home, abd pain with pancreatitis, for ECRP today. TOC following.         Expected Discharge Plan and Services                                                 Social Determinants of Health (SDOH) Interventions    Readmission Risk Interventions    01/23/2022   12:38 PM 05/28/2020   12:50 PM 03/12/2020    2:48 PM  Readmission Risk Prevention Plan  Transportation Screening Complete Complete Complete  PCP or Specialist Appt within 3-5 Days Complete    HRI or [place] Complete    Social Work Consult for [place] Planning/Counseling Complete    Palliative Care Screening Not Applicable    Medication Review Press photographer) Complete Complete Complete  PCP or Specialist appointment within 3-5 days of discharge  Complete Complete  HRI or Home Care Consult  Complete Complete  SW Recovery Care/Counseling Consult  Complete Complete  Palliative Care Screening  Not Applicable Not [place]  Not Applicable Not Applicable

## 2022-02-03 NOTE — Progress Notes (Signed)
[name] for Infectious Disease  Date of Admission:  01/31/2022   Total days of inpatient antibiotics 2  Principal Problem:   Acute pancreatitis Active Problems:   Hyperlipemia   Chronic HFrEF (heart failure with reduced ejection fraction) (HCC)   CKD (chronic kidney disease), stage IV (HCC)   Nausea & vomiting   Hypothyroidism   Type 2 diabetes mellitus with diabetic polyneuropathy, with long-term current use of insulin (HCC)   Abdominal pain   SIRS (systemic inflammatory response syndrome) (HCC)   Transaminitis   History of anemia due to chronic kidney disease   Paroxysmal atrial fibrillation (HCC)          Assessment: [name] with:  #Enterococcus faecalis and E faecium bacteremia suspect 2/2 GI source #Choledocholithiasis with mild pancreatitis #Dm-well controlled #Onychomycosis #CKD -TTE showed more thickened and calcified aortic valve compared to prior study -No diarrhea today, continues to have nausea - Abdominal ultrasound showed gallstones with prominent CBD no CBD stones visualized. -GI was consulted.  MRCP showed choledocholithiasis with mild pancreatitis(2 mm stone in the dependent portion of distal common bile duct and gentle tapering of the duct at level of the ampulla).  Plan on ERCP today. Recommendations: -Continue ampicillin -Follow blood Cx and await sens -Repeat blood Cx -TEE -Continue Ampicillin -Follow-up ERCP  Microbiology:   Antibiotics: 9/9 Ampicillin - Vancomycin 9/10 Cultures: Blood 9/8 NG    SUBJECTIVE: Resting in bed. No new complaints. Continues to have abdominal pain Interval:  Afebrile overnight. Wbc 6.1k  Review of Systems: Review of Systems  All other systems reviewed and are negative.    Scheduled Meds:  [MAR Hold] amiodarone  200 mg Oral Daily   [MAR Hold] carvedilol  6.25 mg Oral BID WC   [MAR Hold] insulin aspart  0-9 Units Subcutaneous Q6H   [MAR Hold] insulin detemir  7 Units Subcutaneous BID    [MAR Hold] levothyroxine  50 mcg Oral QAC breakfast   Continuous Infusions:  sodium chloride 20 mL/hr at 02/03/22 1150   [MAR Hold] vancomycin     PRN Meds:.[MAR Hold] acetaminophen **OR** [MAR Hold] acetaminophen, [MAR Hold] fentaNYL (SUBLIMAZE) injection, [MAR Hold] loperamide, [MAR Hold] LORazepam, [MAR Hold] naLOXone (NARCAN)  injection, [MAR Hold] mouth rinse Allergies  Allergen Reactions   Keflex [Cephalexin] Diarrhea   Codeine Nausea And Vomiting and Other (See Comments)    OBJECTIVE: Vitals:   02/03/22 0824 02/03/22 0947 02/03/22 1144 02/03/22 1254  BP: 122/60 129/62 (!) 109/57 (!) 142/70  Pulse:    68  Resp:   12 10  Temp: 98.1 F (36.7 C)  98 F (36.7 C) (!) 97.4 F (36.3 C)  TempSrc: Oral  Oral Temporal  SpO2:    98%  Weight:    107.9 kg  Height:    '5\' 6"'$  (1.676 m)   Body mass index is 38.39 kg/m.  Physical Exam Constitutional:      Appearance: Normal appearance.  HENT:     Head: Normocephalic and atraumatic.     Right Ear: Tympanic membrane normal.     Left Ear: Tympanic membrane normal.     Nose: Nose normal.     Mouth/Throat:     Mouth: Mucous membranes are moist.  Eyes:     Extraocular Movements: Extraocular movements intact.     Conjunctiva/sclera: Conjunctivae normal.     Pupils: Pupils are equal, round, and reactive to light.  Cardiovascular:     Rate and Rhythm: Normal rate and regular  rhythm.     Heart sounds: No murmur heard.    No friction rub. No gallop.  Pulmonary:     Effort: Pulmonary effort is normal.     Breath sounds: Normal breath sounds.  Abdominal:     General: Abdomen is flat.     Palpations: Abdomen is soft.  Musculoskeletal:        General: Normal range of motion.  Skin:    General: Skin is warm and dry.  Neurological:     General: No focal deficit present.     Mental Status: She is alert and oriented to person, place, and time.  Psychiatric:        Mood and Affect: Mood normal.       Lab Results Lab Results   Component Value Date   WBC 6.1 02/03/2022   HGB 9.1 (L) 02/03/2022   HCT 28.1 (L) 02/03/2022   MCV 84.1 02/03/2022   PLT 158 02/03/2022    Lab Results  Component Value Date   CREATININE 3.71 (H) 02/03/2022   BUN 85 (H) 02/03/2022   NA 133 (L) 02/03/2022   K 4.0 02/03/2022   CL 96 (L) 02/03/2022   CO2 24 02/03/2022    Lab Results  Component Value Date   ALT 33 02/03/2022   AST 27 02/03/2022   GGT 144 (H) 02/01/2022   ALKPHOS 205 (H) 02/03/2022   BILITOT 0.9 02/03/2022        [name], [place] 02/03/2022, 2:21 PM

## 2022-02-03 NOTE — Progress Notes (Signed)
  Progress Note   Patient: Susan [name] BJS:[id number] DOB: August 10, 1950 DOA: 01/31/2022     2 DOS: the patient was seen and examined on 02/03/2022   Brief hospital course: 59VP with hx systolic chf, DM, CKD 4, afib on eliquis presented with epigastric pain with n/v admitted for acute pancreatitis.  Assessment and Plan: #) Acute pancreatitis:  -Presenting lipase in excess of 1300  -RUQ [name] reviewed with cholelithiasis, full GB without evidence of acute cholecystitis and normal CBD -Follow up MRI reviewed. Findings of 39m stone in CBD, cholelithiasis without acute cholecystitis. Fluid signal lesion in inferior tip of spleen seen, likely benign. Can consider nonemergent abd MRI w/wo contast as outpatient later -tolerated clears -Appreciate input by GI. Pt now s/p ERCP 9/11. F/u on results -recheck cmp in AM   #) Sepsis with enterococcus bacteremia -Presentation notable for leukocytosis, present with cell count of 20,100 representing interval increase compared to 2 days ago, as well as mild tachycardia.   -Blood cx pos for enterococcus -ID consulted. Recs to continue on ampicillin, repeat blood cx and to f/u on TEE -Discussed with Cardiology. TEE scheduled for 9/12   #) Chronic biventricular systolic heart failure: -holding off on diuretic for now while in hospital -Not on an ACE inhibitor or ARB in the setting of stage IV CKD. -appears euvolemic to mildly overloaded this AM -IVF now stopped. Pt reports voiding well    #) Poorly controlled type 2 diabetes mellitus - most recent hemoglobin A1c noted to be greater than 15.5% -cont on insulin as needed while in hospital -Pt on Januvia PTA. Given presenting pancreatitis, would avoid continuing moving forward   #) Stage IV CKD:  -renal function appears stable -repeat bmet in AM   #) Anemia of chronic kidney disease:  -Hgb appears stable at this time -Remains hemodynamically stable -Repeat cbc in AM   #) Hyperlipidemia:  -statin currently  on hold in setting of above   #) acquired hypothyroidism:  -Cont synthroid per home regimen    #) Paroxysmal atrial fibrillation:  -CHA2DS2-VASc score of 6, on eliquis -Rate controlled, on amiodarone and coreg -anticoagulation currently on hold      Subjective: Seen this AM prior to ERCP. Pt reported feeling very anxious about the procedure  Physical Exam: Vitals:   02/03/22 1510 02/03/22 1520 02/03/22 1530 02/03/22 1607  BP: (!) 107/41 (!) 108/37 (!) 98/56 (!) 140/124  Pulse: 70 67 65 66  Resp: '13 13 13 13  '$ Temp:    97.6 F (36.4 C)  TempSrc:    Oral  SpO2: 96% 95% 95% 98%  Weight:      Height:       General exam: Conversant, in no acute distress Respiratory system: normal chest rise, clear, no audible wheezing Cardiovascular system: regular rhythm, s1-s2 Gastrointestinal system: Nondistended, nontender, pos BS Central nervous system: No seizures, no tremors Extremities: No cyanosis, no joint deformities, BLE edema Skin: No rashes, no pallor Psychiatry: Affect normal // no auditory hallucinations   Data Reviewed:  Labs reviewed: Na 133, K 4.0, Cr 3.71  Family Communication: Pt in room, family not at bedside  Disposition: Status is: Inpatient Remains inpatient appropriate because: Severity of illness  Planned Discharge Destination: Home     Author: SMarylu Lund MD 02/03/2022 4:35 PM  For on call review [email or web address]

## 2022-02-03 NOTE — Inpatient Diabetes Management (Signed)
Inpatient Diabetes Program Recommendations  AACE/ADA: New Consensus Statement on Inpatient Glycemic Control (2015)  Target Ranges:  Prepandial:   less than 140 mg/dL      Peak postprandial:   less than 180 mg/dL (1-2 hours)      Critically ill patients:  140 - 180 mg/dL   Lab Results  Component Value Date   GLUCAP 165 (H) 02/03/2022   HGBA1C >15.5 (H) 01/20/2022    Review of Glycemic Control  Latest Reference Range & Units 02/03/22 00:05 02/03/22 04:12 02/03/22 06:21 02/03/22 11:20  Glucose-Capillary 70 - 99 mg/dL 221 (H) 165 (H) 151 (H) 165 (H)  (H): Data is abnormally high Diabetes history: DM2 Outpatient Diabetes medications: Omnipod Current orders for Inpatient glycemic control: Per Lovena Le from Dr. Almetta Lovely office, patients insulin pump settings are:  MN-5A- 1.05 units/hr  5A-9A- 1.15 units/hr  9A-3P- 1.8 units/hr  3P-MN- 1 unit/hr  Total 24 hour basal is 29.65 units/24 hours-  Patient uses Regular insulin in insulin pump and has a SSI that she uses if blood sugars are really high and also has NPH 15 units prn if blood sugar >300 mg/dL.  Current orders for Inpatient glycemic control:  Novolog 0-9 units Q6H, Levemir 7 units BID    Inpatient Diabetes Program Recommendations:    Spoke with patient regarding outpatient diabetes management. Was recently seen on 8/30 and per DM coordinator patient was supposed to follow up with Dr [name], outpatient endocrinology, however, anticipates she will miss her appointment due to hospitalization. Patient has been without her Omnipod. Reviewed patient's current A1c of >15%. Explained what a A1c is and what it measures. Also reviewed goal A1c with patient, importance of good glucose control @ home, and blood sugar goals. Reports that patient's husband has been applying the Omnipod and helping to manage the insulin pump.  Anticipate need for SQ injections at discharge until patient sees Dr [name].   Thanks, Bronson Curb, MSN,  RNC-[facility] Diabetes Coordinator [phone] (8a-5p)

## 2022-02-03 NOTE — Op Note (Signed)
[facility] Patient Name: Susan [name] Procedure Date : 02/03/2022 MRN: [id number] Attending MD: Carol Ada , MD Date of Birth: 02/18/1951 CSN: [id number] Age: 71 Admit Type: Inpatient Procedure:                ERCP Indications:              Common bile duct stone(s) Providers:                Carol Ada, MD, Dulcy Fanny, Cletis Athens,                            Technician Referring MD:              Medicines:                General Anesthesia Complications:            No immediate complications. Estimated Blood Loss:     Estimated blood loss: none. Procedure:                Pre-Anesthesia Assessment:                           - Prior to the procedure, a History and Physical                            was performed, and patient medications and                            allergies were reviewed. The patient's tolerance of                            previous anesthesia was also reviewed. The risks                            and benefits of the procedure and the sedation                            options and risks were discussed with the patient.                            All questions were answered, and informed consent                            was obtained. Prior Anticoagulants: The patient has                            taken no previous anticoagulant or antiplatelet                            agents. ASA Grade Assessment: III - A patient with                            severe systemic disease. After reviewing the risks                            and benefits,  the patient was deemed in                            satisfactory condition to undergo the procedure.                           - Sedation was administered by an anesthesia                            professional. General anesthesia was attained.                           After obtaining informed consent, the scope was                            passed under direct vision. Throughout the                             procedure, the patient's blood pressure, pulse, and                            oxygen saturations were monitored continuously. The                            TFJ-Q190V ([id number] Olympus duodenoscope was                            introduced through the mouth, and used to inject                            contrast into and used to inject contrast into the                            bile duct and ventral pancreatic duct. The ERCP was                            technically difficult and complex. The patient                            tolerated the procedure well. Scope In: Scope Out: Findings:      The major papilla was normal. The bile duct was deeply cannulated with       the short-nosed traction sphincterotome. Contrast was injected. I       personally interpreted the bile duct images. There was brisk flow of       contrast through the ducts. Image quality was excellent. Contrast       extended to the cystic duct. Contrast extended to the gallbladder.       Contrast extended to the bifurcation. The gallbladder contained multiple       stones. The common bile duct was diffusely dilated, acquired. The       largest diameter was 12 mm. A long 0.035 inch Soft Jagwire was passed       into the biliary tree. A 10 mm biliary sphincterotomy was made with a       monofilament  traction (standard) sphincterotome using ERBE       electrocautery. There was no post-sphincterotomy bleeding. The biliary       tree was swept with a 15 mm balloon starting at the bifurcation. Two       stones were removed. No stones remained.      Because of the patient's poor EF, anesthesia requested that the patient       be in the supine position. The procedure was successfully performed in       this position, but positioning with the duodenoscope was difficult.       Cannulation first resulted in the cannulation of ventral ducts of the       PD. A two wire technique was used and this allowed the Resolution  wire       the successfully cannulate the CBD. Contrast injection showed that the       CBD was dilated to 12 mm and there were multiple gallstones. There was       no clear evidence of any radiolucencies in the CBD. A 1 cm       sphincterotomy was created and there was good drainage of bile. The CBD       was swept four times and to elongated linear black stones were       extracted. The final occlusion cholangiogram was negative for any       retained stones. Impression:               - The major papilla appeared normal.                           - The common bile duct was dilated, acquired.                           - Choledocholithiasis was found. Complete removal                            was accomplished by biliary sphincterotomy and                            balloon extraction.                           - A biliary sphincterotomy was performed.                           - The biliary tree was swept. Recommendation:           - Return patient to hospital ward for ongoing care.                           - Clear liquid diet. Procedure Code(s):        --- Professional ---                           [phone], Endoscopic retrograde                            cholangiopancreatography (ERCP); with removal of  calculi/debris from biliary/pancreatic duct(s)                           [phone], Endoscopic retrograde                            cholangiopancreatography (ERCP); with                            sphincterotomy/papillotomy                           [phone], Endoscopic catheterization of the biliary                            ductal system, radiological supervision and                            interpretation Diagnosis Code(s):        --- Professional ---                           K80.50, Calculus of bile duct without cholangitis                            or cholecystitis without obstruction                           K83.8, Other specified diseases of biliary  tract CPT copyright 2019 [employer]. All rights reserved. The codes documented in this report are preliminary and upon coder review may  be revised to meet current compliance requirements. Carol Ada, MD Carol Ada, MD 02/03/2022 6:30:28 PM This report has been signed electronically. Number of Addenda: 0

## 2022-02-03 NOTE — Anesthesia Procedure Notes (Signed)
Procedure Name: Intubation Date/Time: 02/03/2022 1:45 PM  Performed by: Lowella Dell, CRNAPre-anesthesia Checklist: Patient identified, Emergency Drugs available, Suction available and Patient being monitored Patient Re-evaluated:Patient Re-evaluated prior to induction Oxygen Delivery Method: Circle System Utilized Preoxygenation: Pre-oxygenation with 100% oxygen Induction Type: IV induction Ventilation: Mask ventilation without difficulty Laryngoscope Size: Mac and 3 Grade View: Grade I Tube type: Oral Number of attempts: 1 Airway Equipment and Method: Stylet Placement Confirmation: ETT inserted through vocal cords under direct vision, positive ETCO2 and breath sounds checked- equal and bilateral Secured at: 22 cm Tube secured with: Tape Dental Injury: Teeth and Oropharynx as per pre-operative assessment

## 2022-02-03 NOTE — Progress Notes (Signed)
   [place] has been requested to perform a transesophageal echocardiogram on [name] for bacteremia.  After careful review of history and examination, the risks and benefits of transesophageal echocardiogram have been explained including risks of esophageal damage, perforation (1:10,000 risk), bleeding, pharyngeal hematoma as well as other potential complications associated with conscious sedation including aspiration, arrhythmia, respiratory failure and death. Alternatives to treatment were discussed, questions were answered. Patient is willing to proceed.   Lyda Jester, PA-C  02/03/2022 3:58 PM

## 2022-02-03 NOTE — Anesthesia Preprocedure Evaluation (Addendum)
Anesthesia Evaluation  Patient identified by MRN, date of birth, ID band Patient awake    Reviewed: Allergy & Precautions, H&P , NPO status , Patient's Chart, lab work & pertinent test results  History of Anesthesia Complications (+) PONV and history of anesthetic complications  Airway Mallampati: II   Neck ROM: full    Dental   Pulmonary shortness of breath, former smoker,    breath sounds clear to auscultation       Cardiovascular hypertension, Pt. on home beta blockers + Cardiac Stents, + Peripheral Vascular Disease and +CHF  + dysrhythmias  Rhythm:regular Rate:Normal  Echo: 1. Left ventricular ejection fraction, by estimation, is 30%. The left  ventricle has moderately decreased function. The left ventricle  demonstrates regional wall motion abnormalities (see scoring  diagram/findings for description). The left ventricular  internal cavity size was moderately dilated. Left ventricular diastolic  parameters are indeterminate. There is the interventricular septum is  flattened in diastole ('D' shaped left ventricle), consistent with right  ventricular volume overload.  2. Right ventricular systolic function is low normal. The right  ventricular size is normal. There is mildly elevated pulmonary artery  systolic pressure. The estimated right ventricular systolic pressure is  62.8 mmHg.  3. The mitral valve is abnormal. Trivial mitral valve regurgitation.  Moderate mitral annular calcification.  4. Tricuspid valve regurgitation is severe.  5. The aortic valve is tricuspid. There is moderate calcification of the  aortic valve. There is mild thickening of the aortic valve. Aortic valve  regurgitation is not visualized.    Neuro/Psych  Headaches, PSYCHIATRIC DISORDERS Anxiety Depression CVA    GI/Hepatic Neg liver ROS, GERD  ,  Endo/Other  diabetes, Type 2, Insulin DependentHypothyroidism   Renal/GU Renal disease      Musculoskeletal  (+) Arthritis ,   Abdominal   Peds  Hematology   Anesthesia Other Findings   Reproductive/Obstetrics                            Anesthesia Physical Anesthesia Plan  ASA: 4  Anesthesia Plan: General   Post-op Pain Management:    Induction: Intravenous  PONV Risk Score and Plan: 4 or greater and Ondansetron, Dexamethasone, Midazolam and Scopolamine patch - Pre-op  Airway Management Planned: Oral ETT  Additional Equipment: None  Intra-op Plan:   Post-operative Plan: Extubation in OR  Informed Consent: I have reviewed the patients History and Physical, chart, labs and discussed the procedure including the risks, benefits and alternatives for the proposed anesthesia with the patient or authorized representative who has indicated his/her understanding and acceptance.     Dental advisory given  Plan Discussed with: CRNA, Anesthesiologist and Surgeon  Anesthesia Plan Comments:       Anesthesia Quick Evaluation

## 2022-02-03 NOTE — Transfer of Care (Signed)
Immediate Anesthesia Transfer of Care Note  Patient: Susan [name]  Procedure(s) Performed: ENDOSCOPIC RETROGRADE CHOLANGIOPANCREATOGRAPHY (ERCP) SPHINCTEROTOMY REMOVAL OF STONES  Patient Location: PACU and Endoscopy Unit  Anesthesia Type:General  Level of Consciousness: alert  and patient cooperative  Airway & Oxygen Therapy: Patient Spontanous Breathing and Patient connected to face mask oxygen  Post-op Assessment: Report given to RN and Post -op Vital signs reviewed and stable  Post vital signs: Reviewed and stable  Last Vitals:  Vitals Value Taken Time  BP 117/43 02/03/22 1501  Temp    Pulse 74 02/03/22 1501  Resp 13 02/03/22 1501  SpO2 96 % 02/03/22 1501    Last Pain:  Vitals:   02/03/22 1501  TempSrc:   PainSc: 0-No pain         Complications: No notable events documented.

## 2022-02-03 NOTE — Plan of Care (Signed)
  Problem: Coping: Goal: Ability to adjust to condition or change in health will improve Outcome: Progressing   Problem: Fluid Volume: Goal: Ability to maintain a balanced intake and output will improve Outcome: Progressing   Problem: Skin Integrity: Goal: Risk for impaired skin integrity will decrease Outcome: Progressing   Problem: Clinical Measurements: Goal: Ability to maintain clinical measurements within normal limits will improve Outcome: Progressing   Problem: Pain Managment: Goal: General experience of comfort will improve Outcome: Progressing   Problem: Safety: Goal: Ability to remain free from injury will improve Outcome: Progressing

## 2022-02-04 ENCOUNTER — Inpatient Hospital Stay (HOSPITAL_COMMUNITY): Payer: HMO

## 2022-02-04 ENCOUNTER — Encounter (HOSPITAL_COMMUNITY): Payer: Self-pay | Admitting: Internal Medicine

## 2022-02-04 ENCOUNTER — Other Ambulatory Visit (HOSPITAL_COMMUNITY): Payer: Self-pay

## 2022-02-04 ENCOUNTER — Inpatient Hospital Stay (HOSPITAL_COMMUNITY): Payer: HMO | Admitting: Certified Registered Nurse Anesthetist

## 2022-02-04 ENCOUNTER — Encounter (HOSPITAL_COMMUNITY)
Admission: EM | Disposition: A | Discharge status: Home / Self Care | Payer: Self-pay | Source: Home / Self Care | Attending: Internal Medicine

## 2022-02-04 DIAGNOSIS — I361 Nonrheumatic tricuspid (valve) insufficiency: Secondary | ICD-10-CM

## 2022-02-04 DIAGNOSIS — R7881 Bacteremia: Secondary | ICD-10-CM

## 2022-02-04 DIAGNOSIS — I2510 Atherosclerotic heart disease of native coronary artery without angina pectoris: Secondary | ICD-10-CM | POA: Diagnosis not present

## 2022-02-04 DIAGNOSIS — I5022 Chronic systolic (congestive) heart failure: Secondary | ICD-10-CM | POA: Diagnosis not present

## 2022-02-04 DIAGNOSIS — I110 Hypertensive heart disease with heart failure: Secondary | ICD-10-CM | POA: Diagnosis not present

## 2022-02-04 DIAGNOSIS — I088 Other rheumatic multiple valve diseases: Secondary | ICD-10-CM | POA: Diagnosis not present

## 2022-02-04 DIAGNOSIS — Z7984 Long term (current) use of oral hypoglycemic drugs: Secondary | ICD-10-CM

## 2022-02-04 DIAGNOSIS — Z87891 Personal history of nicotine dependence: Secondary | ICD-10-CM

## 2022-02-04 DIAGNOSIS — K859 Acute pancreatitis without necrosis or infection, unspecified: Secondary | ICD-10-CM | POA: Diagnosis not present

## 2022-02-04 DIAGNOSIS — E1151 Type 2 diabetes mellitus with diabetic peripheral angiopathy without gangrene: Secondary | ICD-10-CM

## 2022-02-04 DIAGNOSIS — K851 Biliary acute pancreatitis without necrosis or infection: Secondary | ICD-10-CM | POA: Diagnosis not present

## 2022-02-04 DIAGNOSIS — I509 Heart failure, unspecified: Secondary | ICD-10-CM | POA: Diagnosis not present

## 2022-02-04 DIAGNOSIS — Z794 Long term (current) use of insulin: Secondary | ICD-10-CM

## 2022-02-04 HISTORY — PX: TEE WITHOUT CARDIOVERSION: SHX5443

## 2022-02-04 LAB — CBC
HCT: 30.3 % — ABNORMAL LOW (ref 36.0–46.0)
Hemoglobin: 9.6 g/dL — ABNORMAL LOW (ref 12.0–15.0)
MCH: 26.7 pg (ref 26.0–34.0)
MCHC: 31.7 g/dL (ref 30.0–36.0)
MCV: 84.2 fL (ref 80.0–100.0)
Platelets: 150 10*3/uL (ref 150–400)
RBC: 3.6 MIL/uL — ABNORMAL LOW (ref 3.87–5.11)
RDW: 15.9 % — ABNORMAL HIGH (ref 11.5–15.5)
WBC: 3.7 10*3/uL — ABNORMAL LOW (ref 4.0–10.5)
nRBC: 0 % (ref 0.0–0.2)

## 2022-02-04 LAB — COMPREHENSIVE METABOLIC PANEL
ALT: 31 U/L (ref 0–44)
AST: 23 U/L (ref 15–41)
Albumin: 2.3 g/dL — ABNORMAL LOW (ref 3.5–5.0)
Alkaline Phosphatase: 255 U/L — ABNORMAL HIGH (ref 38–126)
Anion gap: 13 (ref 5–15)
BUN: 84 mg/dL — ABNORMAL HIGH (ref 8–23)
CO2: 23 mmol/L (ref 22–32)
Calcium: 8.4 mg/dL — ABNORMAL LOW (ref 8.9–10.3)
Chloride: 94 mmol/L — ABNORMAL LOW (ref 98–111)
Creatinine, Ser: 3.44 mg/dL — ABNORMAL HIGH (ref 0.44–1.00)
GFR, Estimated: 14 mL/min — ABNORMAL LOW (ref >60)
Glucose, Bld: 391 mg/dL — ABNORMAL HIGH (ref 70–99)
Potassium: 5.1 mmol/L (ref 3.5–5.1)
Sodium: 130 mmol/L — ABNORMAL LOW (ref 135–145)
Total Bilirubin: 1 mg/dL (ref 0.3–1.2)
Total Protein: 5.7 g/dL — ABNORMAL LOW (ref 6.5–8.1)

## 2022-02-04 LAB — GLUCOSE, CAPILLARY
Glucose-Capillary: 214 mg/dL — ABNORMAL HIGH (ref 70–99)
Glucose-Capillary: 220 mg/dL — ABNORMAL HIGH (ref 70–99)
Glucose-Capillary: 400 mg/dL — ABNORMAL HIGH (ref 70–99)
Glucose-Capillary: 383 mg/dL — ABNORMAL HIGH (ref 70–99)
Glucose-Capillary: 296 mg/dL — ABNORMAL HIGH (ref 70–99)
Glucose-Capillary: 317 mg/dL — ABNORMAL HIGH (ref 70–99)
Glucose-Capillary: 309 mg/dL — ABNORMAL HIGH (ref 70–99)
Glucose-Capillary: 345 mg/dL — ABNORMAL HIGH (ref 70–99)
Glucose-Capillary: 321 mg/dL — ABNORMAL HIGH (ref 70–99)

## 2022-02-04 LAB — CULTURE, BLOOD (ROUTINE X 2)

## 2022-02-04 SURGERY — ECHOCARDIOGRAM, TRANSESOPHAGEAL
Anesthesia: Monitor Anesthesia Care

## 2022-02-04 MED ORDER — INSULIN ASPART 100 UNIT/ML IJ SOLN
9.0000 [IU] | Freq: Once | INTRAMUSCULAR | Status: AC
  Administered 2022-02-04: 9 [IU] via SUBCUTANEOUS

## 2022-02-04 MED ORDER — INSULIN DETEMIR 100 UNIT/ML ~~LOC~~ SOLN
12.0000 [IU] | Freq: Two times a day (BID) | SUBCUTANEOUS | Status: DC
  Administered 2022-02-04: 12 [IU] via SUBCUTANEOUS
  Filled 2022-02-04 (×4): qty 0.12

## 2022-02-04 MED ORDER — SODIUM CHLORIDE 0.9 % IV SOLN: INTRAVENOUS | Status: DC | PRN

## 2022-02-04 MED ORDER — SODIUM CHLORIDE 0.9% FLUSH
3.0000 mL | Freq: Two times a day (BID) | INTRAVENOUS | Status: DC
  Administered 2022-02-04 – 2022-02-11 (×13): 3 mL via INTRAVENOUS

## 2022-02-04 MED ORDER — SODIUM CHLORIDE 0.9 % IV SOLN: 250.0000 mL | INTRAVENOUS | Status: DC | PRN

## 2022-02-04 MED ORDER — SODIUM CHLORIDE 0.9 % IV SOLN: INTRAVENOUS | Status: DC

## 2022-02-04 MED ORDER — PROPOFOL 500 MG/50ML IV EMUL
INTRAVENOUS | Status: DC | PRN
  Administered 2022-02-04: 100 ug/kg/min via INTRAVENOUS

## 2022-02-04 MED ORDER — PROPOFOL 10 MG/ML IV BOLUS
INTRAVENOUS | Status: DC | PRN
  Administered 2022-02-04 (×2): 10 mg via INTRAVENOUS

## 2022-02-04 MED ORDER — LIDOCAINE 2% (20 MG/ML) 5 ML SYRINGE
INTRAMUSCULAR | Status: DC | PRN
  Administered 2022-02-04: 100 mg via INTRAVENOUS

## 2022-02-04 MED ORDER — ASPIRIN 81 MG PO CHEW
81.0000 mg | CHEWABLE_TABLET | ORAL | Status: AC
  Administered 2022-02-05: 81 mg via ORAL
  Filled 2022-02-04: qty 1

## 2022-02-04 MED ORDER — SODIUM CHLORIDE 0.9% FLUSH: 3.0000 mL | INTRAVENOUS | Status: DC | PRN

## 2022-02-04 NOTE — Anesthesia Postprocedure Evaluation (Signed)
Anesthesia Post Note  Patient: Susan [name]  Procedure(s) Performed: ENDOSCOPIC RETROGRADE CHOLANGIOPANCREATOGRAPHY (ERCP) SPHINCTEROTOMY REMOVAL OF STONES     Patient location during evaluation: PACU Anesthesia Type: General Level of consciousness: awake and alert Pain management: pain level controlled Vital Signs Assessment: post-procedure vital signs reviewed and stable Respiratory status: spontaneous breathing, nonlabored ventilation, respiratory function stable and patient connected to nasal cannula oxygen Cardiovascular status: blood pressure returned to baseline and stable Postop Assessment: no apparent nausea or vomiting Anesthetic complications: no   No notable events documented.  Last Vitals:  Vitals:   02/04/22 0340 02/04/22 0800  BP: 137/62 135/63  Pulse: 70 90  Resp: 13 13  Temp: 36.5 C   SpO2: 97% 99%    Last Pain:  Vitals:   02/04/22 0800  TempSrc:   PainSc: 0-No pain                 Bassheva Flury S

## 2022-02-04 NOTE — Consult Note (Signed)
   Susan [name] 07/21/1950  [id number].    Requesting MD: Dr. 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  She really denies any pain during this time.  Friday she ate lunch and later than night developed epigastric and some RUQ abdominal pain along with N/V/D.  She presented to the ED where she was found to have gallstone pancreatitis with a CBD stone.  Her eliquis has been held and she underwent an ERCP yesterday with sphincterotomy and extraction of the stone.  She has one blood culture positive for enterococcus and is awaiting a TEE today.  We have been asked to see her today for evaluation for a lap chole.  ROS: ROS Please see HPI, + LE edema  Family History  Problem Relation Age of Onset   Heart attack Mother 72    Past Medical History:  Diagnosis Date   Acute MI (HCC) 1999; 2007   Anemia    hx   Anginal pain (HCC)    Anxiety    ARF (acute renal failure) (HCC) 06/2017   [facility]   Arthritis    "generalized" (03/15/2014)   CAD (coronary artery disease)    MI in 2000 - MI  2007 - treated bare metal stent (no nuclear since then as 9/11)   Carotid artery disease (HCC)    CHF (congestive heart failure) (HCC)    Chronic diastolic heart failure (HCC)    a) ECHO (08/2013) EF 55-60% and RV function nl b) RHC (08/2013) RA 4, RV 30/5/7, PA 25/10 (16), PCWP 7, Fick CO/CI 6.3/2.7, PVR 1.5 WU, PA 61 and 66%   Daily headache    "~ every other day; since I fell in June" (03/15/2014)   Depression    Dyslipidemia    Dyspnea    Exertional shortness of breath    HTN (hypertension)    Hypothyroidism    Neuropathy    Obesity    Osteoarthritis     Peripheral neuropathy    PONV (postoperative nausea and vomiting)    RBBB (right bundle branch block)    Old   Stroke (HCC)    mini strokes   Syncope    likely due to low blood sugar   Tachycardia    Sinus tachycardia   Type II diabetes mellitus (HCC)    Type II   Urinary incontinence    Venous insufficiency     Past Surgical History:  Procedure Laterality Date   ABDOMINAL HYSTERECTOMY  1980's   AMPUTATION Right 02/24/2018   Procedure: RIGHT FOOT GREAT TOE AND 2ND TOE AMPUTATION;  Surgeon: Duda, Marcus V, MD;  [facility];  Service: Orthopedics;  Laterality: Right;   AMPUTATION Right 04/30/2018   Procedure: RIGHT TRANSMETATARSAL AMPUTATION;  Surgeon: Duda, Marcus V, MD;  [facility]: [facility];  Service: Orthopedics;  Laterality: Right;   BIOPSY  05/27/2020   Procedure: BIOPSY;  Surgeon: Carver, Charles K, DO;  [facility];  Service: Endoscopy;;   CATARACT EXTRACTION, BILATERAL Bilateral ?2013   COLONOSCOPY W/ POLYPECTOMY     COLONOSCOPY WITH PROPOFOL N/A 03/13/2019   Procedure: COLONOSCOPY WITH PROPOFOL;  Surgeon: Pyrtle, Jay      Susan [name] 07/21/1950  [id number].    Requesting MD: Dr. 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  She really denies any pain during this time.  Friday she ate lunch and later than night developed epigastric and some RUQ abdominal pain along with N/V/D.  She presented to the ED where she was found to have gallstone pancreatitis with a CBD stone.  Her eliquis has been held and she underwent an ERCP yesterday with sphincterotomy and extraction of the stone.  She has one blood culture positive for enterococcus and is awaiting a TEE today.  We have been asked to see her today for evaluation for a lap chole.  ROS: ROS Please see HPI, + LE edema  Family History  Problem Relation Age of Onset   Heart attack Mother 72    Past Medical History:  Diagnosis Date   Acute MI (HCC) 1999; 2007   Anemia    hx   Anginal pain (HCC)    Anxiety    ARF (acute renal failure) (HCC) 06/2017   [facility]   Arthritis    "generalized" (03/15/2014)   CAD (coronary artery disease)    MI in 2000 - MI  2007 - treated bare metal stent (no nuclear since then as 9/11)   Carotid artery disease (HCC)    CHF (congestive heart failure) (HCC)    Chronic diastolic heart failure (HCC)    a) ECHO (08/2013) EF 55-60% and RV function nl b) RHC (08/2013) RA 4, RV 30/5/7, PA 25/10 (16), PCWP 7, Fick CO/CI 6.3/2.7, PVR 1.5 WU, PA 61 and 66%   Daily headache    "~ every other day; since I fell in June" (03/15/2014)   Depression    Dyslipidemia    Dyspnea    Exertional shortness of breath    HTN (hypertension)    Hypothyroidism    Neuropathy    Obesity    Osteoarthritis     Peripheral neuropathy    PONV (postoperative nausea and vomiting)    RBBB (right bundle branch block)    Old   Stroke (HCC)    mini strokes   Syncope    likely due to low blood sugar   Tachycardia    Sinus tachycardia   Type II diabetes mellitus (HCC)    Type II   Urinary incontinence    Venous insufficiency     Past Surgical History:  Procedure Laterality Date   ABDOMINAL HYSTERECTOMY  1980's   AMPUTATION Right 02/24/2018   Procedure: RIGHT FOOT GREAT TOE AND 2ND TOE AMPUTATION;  Surgeon: Duda, Marcus V, MD;  [facility];  Service: Orthopedics;  Laterality: Right;   AMPUTATION Right 04/30/2018   Procedure: RIGHT TRANSMETATARSAL AMPUTATION;  Surgeon: Duda, Marcus V, MD;  [facility];  Service: Orthopedics;  Laterality: Right;   BIOPSY  05/27/2020   Procedure: BIOPSY;  Surgeon: Carver, Charles K, DO;  [facility];  Service: Endoscopy;;   CATARACT EXTRACTION, BILATERAL Bilateral ?2013   COLONOSCOPY W/ POLYPECTOMY     COLONOSCOPY WITH PROPOFOL N/A 03/13/2019   Procedure: COLONOSCOPY WITH PROPOFOL;  Surgeon: Pyrtle, Jay      Susan [name] 07/21/1950  [id number].    Requesting MD: Dr. 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  She really denies any pain during this time.  Friday she ate lunch and later than night developed epigastric and some RUQ abdominal pain along with N/V/D.  She presented to the ED where she was found to have gallstone pancreatitis with a CBD stone.  Her eliquis has been held and she underwent an ERCP yesterday with sphincterotomy and extraction of the stone.  She has one blood culture positive for enterococcus and is awaiting a TEE today.  We have been asked to see her today for evaluation for a lap chole.  ROS: ROS Please see HPI, + LE edema  Family History  Problem Relation Age of Onset   Heart attack Mother 72    Past Medical History:  Diagnosis Date   Acute MI (HCC) 1999; 2007   Anemia    hx   Anginal pain (HCC)    Anxiety    ARF (acute renal failure) (HCC) 06/2017   [facility]   Arthritis    "generalized" (03/15/2014)   CAD (coronary artery disease)    MI in 2000 - MI  2007 - treated bare metal stent (no nuclear since then as 9/11)   Carotid artery disease (HCC)    CHF (congestive heart failure) (HCC)    Chronic diastolic heart failure (HCC)    a) ECHO (08/2013) EF 55-60% and RV function nl b) RHC (08/2013) RA 4, RV 30/5/7, PA 25/10 (16), PCWP 7, Fick CO/CI 6.3/2.7, PVR 1.5 WU, PA 61 and 66%   Daily headache    "~ every other day; since I fell in June" (03/15/2014)   Depression    Dyslipidemia    Dyspnea    Exertional shortness of breath    HTN (hypertension)    Hypothyroidism    Neuropathy    Obesity    Osteoarthritis     Peripheral neuropathy    PONV (postoperative nausea and vomiting)    RBBB (right bundle branch block)    Old   Stroke (HCC)    mini strokes   Syncope    likely due to low blood sugar   Tachycardia    Sinus tachycardia   Type II diabetes mellitus (HCC)    Type II   Urinary incontinence    Venous insufficiency     Past Surgical History:  Procedure Laterality Date   ABDOMINAL HYSTERECTOMY  1980's   AMPUTATION Right 02/24/2018   Procedure: RIGHT FOOT GREAT TOE AND 2ND TOE AMPUTATION;  Surgeon: Duda, Marcus V, MD;  [facility];  Service: Orthopedics;  Laterality: Right;   AMPUTATION Right 04/30/2018   Procedure: RIGHT TRANSMETATARSAL AMPUTATION;  Surgeon: Duda, Marcus V, MD;  [facility];  Service: Orthopedics;  Laterality: Right;   BIOPSY  05/27/2020   Procedure: BIOPSY;  Surgeon: Carver, Charles K, DO;  [facility];  Service: Endoscopy;;   CATARACT EXTRACTION, BILATERAL Bilateral ?2013   COLONOSCOPY W/ POLYPECTOMY     COLONOSCOPY WITH PROPOFOL N/A 03/13/2019   Procedure: COLONOSCOPY WITH PROPOFOL;  Surgeon: Pyrtle, Jay      Susan [name] 07/21/1950  [id number].    Requesting MD: Dr. 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  She really denies any pain during this time.  Friday she ate lunch and later than night developed epigastric and some RUQ abdominal pain along with N/V/D.  She presented to the ED where she was found to have gallstone pancreatitis with a CBD stone.  Her eliquis has been held and she underwent an ERCP yesterday with sphincterotomy and extraction of the stone.  She has one blood culture positive for enterococcus and is awaiting a TEE today.  We have been asked to see her today for evaluation for a lap chole.  ROS: ROS Please see HPI, + LE edema  Family History  Problem Relation Age of Onset   Heart attack Mother 72    Past Medical History:  Diagnosis Date   Acute MI (HCC) 1999; 2007   Anemia    hx   Anginal pain (HCC)    Anxiety    ARF (acute renal failure) (HCC) 06/2017   [facility]   Arthritis    "generalized" (03/15/2014)   CAD (coronary artery disease)    MI in 2000 - MI  2007 - treated bare metal stent (no nuclear since then as 9/11)   Carotid artery disease (HCC)    CHF (congestive heart failure) (HCC)    Chronic diastolic heart failure (HCC)    a) ECHO (08/2013) EF 55-60% and RV function nl b) RHC (08/2013) RA 4, RV 30/5/7, PA 25/10 (16), PCWP 7, Fick CO/CI 6.3/2.7, PVR 1.5 WU, PA 61 and 66%   Daily headache    "~ every other day; since I fell in June" (03/15/2014)   Depression    Dyslipidemia    Dyspnea    Exertional shortness of breath    HTN (hypertension)    Hypothyroidism    Neuropathy    Obesity    Osteoarthritis     Peripheral neuropathy    PONV (postoperative nausea and vomiting)    RBBB (right bundle branch block)    Old   Stroke (HCC)    mini strokes   Syncope    likely due to low blood sugar   Tachycardia    Sinus tachycardia   Type II diabetes mellitus (HCC)    Type II   Urinary incontinence    Venous insufficiency     Past Surgical History:  Procedure Laterality Date   ABDOMINAL HYSTERECTOMY  1980's   AMPUTATION Right 02/24/2018   Procedure: RIGHT FOOT GREAT TOE AND 2ND TOE AMPUTATION;  Surgeon: Duda, Marcus V, MD;  [facility];  Service: Orthopedics;  Laterality: Right;   AMPUTATION Right 04/30/2018   Procedure: RIGHT TRANSMETATARSAL AMPUTATION;  Surgeon: Duda, Marcus V, MD;  [facility]: [facility];  Service: Orthopedics;  Laterality: Right;   BIOPSY  05/27/2020   Procedure: BIOPSY;  Surgeon: Carver, Charles K, DO;  [facility];  Service: Endoscopy;;   CATARACT EXTRACTION, BILATERAL Bilateral ?2013   COLONOSCOPY W/ POLYPECTOMY     COLONOSCOPY WITH PROPOFOL N/A 03/13/2019   Procedure: COLONOSCOPY WITH PROPOFOL;  Surgeon: Pyrtle, Jay      Susan [name] 07/21/1950  [id number].    Requesting MD: Dr. 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  She really denies any pain during this time.  Friday she ate lunch and later than night developed epigastric and some RUQ abdominal pain along with N/V/D.  She presented to the ED where she was found to have gallstone pancreatitis with a CBD stone.  Her eliquis has been held and she underwent an ERCP yesterday with sphincterotomy and extraction of the stone.  She has one blood culture positive for enterococcus and is awaiting a TEE today.  We have been asked to see her today for evaluation for a lap chole.  ROS: ROS Please see HPI, + LE edema  Family History  Problem Relation Age of Onset   Heart attack Mother 72    Past Medical History:  Diagnosis Date   Acute MI (HCC) 1999; 2007   Anemia    hx   Anginal pain (HCC)    Anxiety    ARF (acute renal failure) (HCC) 06/2017   [facility]   Arthritis    "generalized" (03/15/2014)   CAD (coronary artery disease)    MI in 2000 - MI  2007 - treated bare metal stent (no nuclear since then as 9/11)   Carotid artery disease (HCC)    CHF (congestive heart failure) (HCC)    Chronic diastolic heart failure (HCC)    a) ECHO (08/2013) EF 55-60% and RV function nl b) RHC (08/2013) RA 4, RV 30/5/7, PA 25/10 (16), PCWP 7, Fick CO/CI 6.3/2.7, PVR 1.5 WU, PA 61 and 66%   Daily headache    "~ every other day; since I fell in June" (03/15/2014)   Depression    Dyslipidemia    Dyspnea    Exertional shortness of breath    HTN (hypertension)    Hypothyroidism    Neuropathy    Obesity    Osteoarthritis     Peripheral neuropathy    PONV (postoperative nausea and vomiting)    RBBB (right bundle branch block)    Old   Stroke (HCC)    mini strokes   Syncope    likely due to low blood sugar   Tachycardia    Sinus tachycardia   Type II diabetes mellitus (HCC)    Type II   Urinary incontinence    Venous insufficiency     Past Surgical History:  Procedure Laterality Date   ABDOMINAL HYSTERECTOMY  1980's   AMPUTATION Right 02/24/2018   Procedure: RIGHT FOOT GREAT TOE AND 2ND TOE AMPUTATION;  Surgeon: Duda, Marcus V, MD;  [facility];  Service: Orthopedics;  Laterality: Right;   AMPUTATION Right 04/30/2018   Procedure: RIGHT TRANSMETATARSAL AMPUTATION;  Surgeon: Duda, Marcus V, MD;  [facility];  Service: Orthopedics;  Laterality: Right;   BIOPSY  05/27/2020   Procedure: BIOPSY;  Surgeon: Carver, Charles K, DO;  [facility];  Service: Endoscopy;;   CATARACT EXTRACTION, BILATERAL Bilateral ?2013   COLONOSCOPY W/ POLYPECTOMY     COLONOSCOPY WITH PROPOFOL N/A 03/13/2019   Procedure: COLONOSCOPY WITH PROPOFOL;  Surgeon: Pyrtle, Jay      Susan [name] 07/21/1950  [id number].    Requesting MD: Dr. 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  She really denies any pain during this time.  Friday she ate lunch and later than night developed epigastric and some RUQ abdominal pain along with N/V/D.  She presented to the ED where she was found to have gallstone pancreatitis with a CBD stone.  Her eliquis has been held and she underwent an ERCP yesterday with sphincterotomy and extraction of the stone.  She has one blood culture positive for enterococcus and is awaiting a TEE today.  We have been asked to see her today for evaluation for a lap chole.  ROS: ROS Please see HPI, + LE edema  Family History  Problem Relation Age of Onset   Heart attack Mother 72    Past Medical History:  Diagnosis Date   Acute MI (HCC) 1999; 2007   Anemia    hx   Anginal pain (HCC)    Anxiety    ARF (acute renal failure) (HCC) 06/2017   [facility]   Arthritis    "generalized" (03/15/2014)   CAD (coronary artery disease)    MI in 2000 - MI  2007 - treated bare metal stent (no nuclear since then as 9/11)   Carotid artery disease (HCC)    CHF (congestive heart failure) (HCC)    Chronic diastolic heart failure (HCC)    a) ECHO (08/2013) EF 55-60% and RV function nl b) RHC (08/2013) RA 4, RV 30/5/7, PA 25/10 (16), PCWP 7, Fick CO/CI 6.3/2.7, PVR 1.5 WU, PA 61 and 66%   Daily headache    "~ every other day; since I fell in June" (03/15/2014)   Depression    Dyslipidemia    Dyspnea    Exertional shortness of breath    HTN (hypertension)    Hypothyroidism    Neuropathy    Obesity    Osteoarthritis     Peripheral neuropathy    PONV (postoperative nausea and vomiting)    RBBB (right bundle branch block)    Old   Stroke (HCC)    mini strokes   Syncope    likely due to low blood sugar   Tachycardia    Sinus tachycardia   Type II diabetes mellitus (HCC)    Type II   Urinary incontinence    Venous insufficiency     Past Surgical History:  Procedure Laterality Date   ABDOMINAL HYSTERECTOMY  1980's   AMPUTATION Right 02/24/2018   Procedure: RIGHT FOOT GREAT TOE AND 2ND TOE AMPUTATION;  Surgeon: Duda, Marcus V, MD;  [facility];  Service: Orthopedics;  Laterality: Right;   AMPUTATION Right 04/30/2018   Procedure: RIGHT TRANSMETATARSAL AMPUTATION;  Surgeon: Duda, Marcus V, MD;  [facility];  Service: Orthopedics;  Laterality: Right;   BIOPSY  05/27/2020   Procedure: BIOPSY;  Surgeon: Carver, Charles K, DO;  [facility];  Service: Endoscopy;;   CATARACT EXTRACTION, BILATERAL Bilateral ?2013   COLONOSCOPY W/ POLYPECTOMY     COLONOSCOPY WITH PROPOFOL N/A 03/13/2019   Procedure: COLONOSCOPY WITH PROPOFOL;  Surgeon: Pyrtle, Jay      Susan [name] 07/21/1950  [id number].    Requesting MD: Dr. 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  She really denies any pain during this time.  Friday she ate lunch and later than night developed epigastric and some RUQ abdominal pain along with N/V/D.  She presented to the ED where she was found to have gallstone pancreatitis with a CBD stone.  Her eliquis has been held and she underwent an ERCP yesterday with sphincterotomy and extraction of the stone.  She has one blood culture positive for enterococcus and is awaiting a TEE today.  We have been asked to see her today for evaluation for a lap chole.  ROS: ROS Please see HPI, + LE edema  Family History  Problem Relation Age of Onset   Heart attack Mother 72    Past Medical History:  Diagnosis Date   Acute MI (HCC) 1999; 2007   Anemia    hx   Anginal pain (HCC)    Anxiety    ARF (acute renal failure) (HCC) 06/2017   [facility]   Arthritis    "generalized" (03/15/2014)   CAD (coronary artery disease)    MI in 2000 - MI  2007 - treated bare metal stent (no nuclear since then as 9/11)   Carotid artery disease (HCC)    CHF (congestive heart failure) (HCC)    Chronic diastolic heart failure (HCC)    a) ECHO (08/2013) EF 55-60% and RV function nl b) RHC (08/2013) RA 4, RV 30/5/7, PA 25/10 (16), PCWP 7, Fick CO/CI 6.3/2.7, PVR 1.5 WU, PA 61 and 66%   Daily headache    "~ every other day; since I fell in June" (03/15/2014)   Depression    Dyslipidemia    Dyspnea    Exertional shortness of breath    HTN (hypertension)    Hypothyroidism    Neuropathy    Obesity    Osteoarthritis     Peripheral neuropathy    PONV (postoperative nausea and vomiting)    RBBB (right bundle branch block)    Old   Stroke (HCC)    mini strokes   Syncope    likely due to low blood sugar   Tachycardia    Sinus tachycardia   Type II diabetes mellitus (HCC)    Type II   Urinary incontinence    Venous insufficiency     Past Surgical History:  Procedure Laterality Date   ABDOMINAL HYSTERECTOMY  1980's   AMPUTATION Right 02/24/2018   Procedure: RIGHT FOOT GREAT TOE AND 2ND TOE AMPUTATION;  Surgeon: Duda, Marcus V, MD;  [facility];  Service: Orthopedics;  Laterality: Right;   AMPUTATION Right 04/30/2018   Procedure: RIGHT TRANSMETATARSAL AMPUTATION;  Surgeon: Duda, Marcus V, MD;  [facility];  Service: Orthopedics;  Laterality: Right;   BIOPSY  05/27/2020   Procedure: BIOPSY;  Surgeon: Carver, Charles K, DO;  [facility];  Service: Endoscopy;;   CATARACT EXTRACTION, BILATERAL Bilateral ?2013   COLONOSCOPY W/ POLYPECTOMY     COLONOSCOPY WITH PROPOFOL N/A 03/13/2019   Procedure: COLONOSCOPY WITH PROPOFOL;  Surgeon: Pyrtle, Jay

## 2022-02-04 NOTE — Care Management Important Message (Signed)
Important Message  Patient Details  Name: Susan [name] MRN: [id number] Date of Birth: December 30, 1950   Medicare Important Message Given:  Yes     Adelie Croswell [name] 02/04/2022, 2:06 PM

## 2022-02-04 NOTE — Plan of Care (Signed)
  Problem: Education: Goal: Ability to describe self-care measures that may prevent or decrease complications (Diabetes Survival Skills Education) will improve Outcome: Progressing   Problem: Coping: Goal: Ability to adjust to condition or change in health will improve 02/04/2022 0701 by Marcie Mowers, RN Outcome: Progressing 02/04/2022 0532 by Marcie Mowers, RN Outcome: Progressing   Problem: Fluid Volume: Goal: Ability to maintain a balanced intake and output will improve 02/04/2022 0701 by Marcie Mowers, RN Outcome: Progressing 02/04/2022 0532 by Marcie Mowers, RN Outcome: Progressing   Problem: Metabolic: Goal: Ability to maintain appropriate glucose levels will improve Outcome: Progressing   Problem: Nutritional: Goal: Maintenance of adequate nutrition will improve 02/04/2022 0701 by Marcie Mowers, RN Outcome: Progressing 02/04/2022 0532 by Marcie Mowers, RN Outcome: Progressing   Problem: Skin Integrity: Goal: Risk for impaired skin integrity will decrease 02/04/2022 0701 by Marcie Mowers, RN Outcome: Progressing 02/04/2022 0532 by Marcie Mowers, RN Outcome: Progressing   Problem: Tissue Perfusion: Goal: Adequacy of tissue perfusion will improve 02/04/2022 0701 by Marcie Mowers, RN Outcome: Progressing 02/04/2022 0532 by Marcie Mowers, RN Outcome: Progressing

## 2022-02-04 NOTE — Inpatient Diabetes Management (Signed)
Inpatient Diabetes Program Recommendations  AACE/ADA: New Consensus Statement on Inpatient Glycemic Control (2015)  Target Ranges:  Prepandial:   less than 140 mg/dL      Peak postprandial:   less than 180 mg/dL (1-2 hours)      Critically ill patients:  140 - 180 mg/dL   Lab Results  Component Value Date   GLUCAP 383 (H) 02/04/2022   HGBA1C >15.5 (H) 01/20/2022    Review of Glycemic Control  Latest Reference Range & Units 02/03/22 06:21 02/03/22 11:20 02/03/22 16:09 02/03/22 17:03 02/03/22 23:26 02/04/22 06:11  Glucose-Capillary 70 - 99 mg/dL 151 (H) 165 (H) 214 (H) 220 (H) 400 (H) 383 (H)   Per Taylor from Dr. Almetta Lovely office, patients insulin pump settings are:  MN-5A- 1.05 units/hr  5A-9A- 1.15 units/hr  9A-3P- 1.8 units/hr  3P-MN- 1 unit/hr  Total 24 hour basal is 29.65 units/24 hours-  Patient uses Regular insulin in insulin pump and has a SSI that she uses if blood sugars are really high and also has NPH 15 units prn if blood sugar >300 mg/dL. Current orders for Inpatient glycemic control: Levemir 7 units BID, Novolog 0-9 units Q6H  Inpatient Diabetes Program Recommendations:   Please consider: -Increase Levemir to 80% home basal insulin dose 12 units bid -When eating, add Novolog 2-6 units tid meal coverage if eats 50%  Thank you, Bethena Roys E. March Joos, RN, MSN, CDE  Diabetes Coordinator Inpatient Glycemic Control Team Team Pager [phone] (8am-5pm) 02/04/2022 9:07 AM

## 2022-02-04 NOTE — Plan of Care (Signed)
  Problem: Coping: Goal: Ability to adjust to condition or change in health will improve Outcome: Progressing   Problem: Fluid Volume: Goal: Ability to maintain a balanced intake and output will improve Outcome: Progressing   Problem: Metabolic: Goal: Ability to maintain appropriate glucose levels will improve Outcome: Progressing   Problem: Nutritional: Goal: Maintenance of adequate nutrition will improve Outcome: Progressing   Problem: Skin Integrity: Goal: Risk for impaired skin integrity will decrease Outcome: Progressing   Problem: Tissue Perfusion: Goal: Adequacy of tissue perfusion will improve Outcome: Progressing   

## 2022-02-04 NOTE — Progress Notes (Signed)
  Progress Note   Patient: Susan [name] [id number]:[id number] DOB: 1950-05-30 DOA: 01/31/2022     3 DOS: the patient was seen and examined on 02/04/2022   Brief hospital course: 89TD with hx systolic chf, DM, CKD 4, afib on eliquis presented with epigastric pain with n/v admitted for acute pancreatitis.  Assessment and Plan: #) Acute gallstone pancreatitis:  -Presenting lipase in excess of 1300  -RUQ [name] reviewed with cholelithiasis, full GB without evidence of acute cholecystitis and normal CBD -Follow up MRI reviewed. Findings of 37m stone in CBD, cholelithiasis without acute cholecystitis. Fluid signal lesion in inferior tip of spleen seen, likely benign. Can consider nonemergent abd MRI w/wo contast as outpatient later -Appreciate input by GI. Pt now s/p ERCP 9/11 with finding of choledocholithiasis s/p removal and sphincterotomy -Have consulted General Surgery for possible cholecystectomy. Cardiology consulted for  perioperative cardiac clearance    #) Sepsis with enterococcus bacteremia -Presentation notable for leukocytosis, present with cell count of 20,100 representing interval increase compared to 2 days ago, as well as mild tachycardia.   -Blood cx pos for enterococcus -ID consulted. Recs to continue on ampicillin, repeat blood cx and to f/u on TEE -TEE scheduled for 9/12   #) Chronic biventricular systolic heart failure: -holding off on diuretic for now while in hospital -Not on an ACE inhibitor or ARB in the setting of stage IV CKD. -appears euvolemic to mildly overloaded this AM -IVF now stopped. Pt reports voiding well    #) Poorly controlled type 2 diabetes mellitus - most recent hemoglobin A1c noted to be greater than 15.5% -cont on insulin as needed while in hospital, cont to titrate dose as needed with goal of euglycemia -Pt on Januvia PTA. Given presenting pancreatitis, would avoid continuing moving forward   #) Stage IV CKD:  -renal function appears stable -repeat bmet in  AM   #) Anemia of chronic kidney disease:  -Hgb appears stable at this time -Remains hemodynamically stable -Repeat cbc in AM   #) Hyperlipidemia:  -statin currently on hold in setting of above   #) acquired hypothyroidism:  -Cont synthroid per home regimen -TSH 1.493    #) Paroxysmal atrial fibrillation:  -CHA2DS2-VASc score of 6, on eliquis -Rate controlled, on amiodarone and coreg -anticoagulation currently on hold pending TEE and surgical eval     Subjective: Feeling somewhat anxious about upcoming TEE today  Physical Exam: Vitals:   02/03/22 2320 02/04/22 0340 02/04/22 0346 02/04/22 0800  BP: (!) 112/49 137/62  135/63  Pulse: 71 70  90  Resp: '15 13  13  '$ Temp: 97.6 F (36.4 C) 97.7 F (36.5 C)    TempSrc: Oral Tympanic    SpO2: 100% 97%  99%  Weight:   108.3 kg   Height:       General exam: Conversant, in no acute distress Respiratory system: normal chest rise, clear, no audible wheezing Cardiovascular system: regular rhythm, s1-s2 Gastrointestinal system: Nondistended, nontender, pos BS Central nervous system: No seizures, no tremors Extremities: No cyanosis, no joint deformities, BLE edema Skin: No rashes, no pallor Psychiatry: Affect normal // no auditory hallucinations   Data Reviewed:  Labs reviewed: Na 130, K 5.1, Cr 3.44  Family Communication: Pt in room, family not at bedside  Disposition: Status is: Inpatient Remains inpatient appropriate because: Severity of illness  Planned Discharge Destination: Home     Author: SMarylu Lund MD 02/04/2022 10:56 AM  For on call review [email or web address]

## 2022-02-04 NOTE — Anesthesia Preprocedure Evaluation (Signed)
Anesthesia Evaluation  Patient identified by MRN, date of birth, ID band Patient awake    Reviewed: Allergy & Precautions, NPO status , Patient's Chart, lab work & pertinent test results  History of Anesthesia Complications (+) PONV and history of anesthetic complications  Airway Mallampati: III  TM Distance: >3 FB Neck ROM: Full    Dental  (+) Teeth Intact, Poor Dentition, Dental Advisory Given   Pulmonary former smoker,  - Continuous oxygen requirements    + decreased breath sounds      Cardiovascular hypertension, Pt. on home beta blockers + CAD, + Cardiac Stents, + Peripheral Vascular Disease and +CHF  + dysrhythmias  Rhythm:Regular Rate:Normal  Echo: 1. Left ventricular ejection fraction, by estimation, is 30%. The left  ventricle has moderately decreased function. The left ventricle  demonstrates regional wall motion abnormalities (see scoring  diagram/findings for description). The left ventricular  internal cavity size was moderately dilated. Left ventricular diastolic  parameters are indeterminate. There is the interventricular septum is  flattened in diastole ('D' shaped left ventricle), consistent with right  ventricular volume overload.  2. Right ventricular systolic function is low normal. The right  ventricular size is normal. There is mildly elevated pulmonary artery  systolic pressure. The estimated right ventricular systolic pressure is  12.1 mmHg.  3. The mitral valve is abnormal. Trivial mitral valve regurgitation.  Moderate mitral annular calcification.  4. Tricuspid valve regurgitation is severe.  5. The aortic valve is tricuspid. There is moderate calcification of the  aortic valve. There is mild thickening of the aortic valve. Aortic valve  regurgitation is not visualized.    Neuro/Psych  Headaches, PSYCHIATRIC DISORDERS Anxiety Depression CVA, Residual Symptoms    GI/Hepatic GERD  ,   Endo/Other  diabetes, Type 2, Insulin Dependent, Oral Hypoglycemic AgentsHypothyroidism   Renal/GU Renal disease     Musculoskeletal   Abdominal (+) + obese,   Peds  Hematology   Anesthesia Other Findings   Reproductive/Obstetrics                             Anesthesia Physical Anesthesia Plan  ASA: 3  Anesthesia Plan: MAC   Post-op Pain Management:    Induction: Intravenous  PONV Risk Score and Plan: 0 and Propofol infusion  Airway Management Planned: Simple Face Mask and Natural Airway  Additional Equipment: None  Intra-op Plan:   Post-operative Plan:   Informed Consent: I have reviewed the patients History and Physical, chart, labs and discussed the procedure including the risks, benefits and alternatives for the proposed anesthesia with the patient or authorized representative who has indicated his/her understanding and acceptance.       Plan Discussed with: CRNA  Anesthesia Plan Comments:         Anesthesia Quick Evaluation

## 2022-02-04 NOTE — H&P (View-Only) (Signed)
  Progress Note   Patient: Susan [name] [id number] DOB: 06/04/1950 DOA: 01/31/2022     3 DOS: the patient was seen and examined on 02/04/2022   Brief hospital course: 89AO with hx systolic chf, DM, CKD 4, afib on eliquis presented with epigastric pain with n/v admitted for acute pancreatitis.  Assessment and Plan: #) Acute gallstone pancreatitis:  -Presenting lipase in excess of 1300  -RUQ [name] reviewed with cholelithiasis, full GB without evidence of acute cholecystitis and normal CBD -Follow up MRI reviewed. Findings of 10m stone in CBD, cholelithiasis without acute cholecystitis. Fluid signal lesion in inferior tip of spleen seen, likely benign. Can consider nonemergent abd MRI w/wo contast as outpatient later -Appreciate input by GI. Pt now s/p ERCP 9/11 with finding of choledocholithiasis s/p removal and sphincterotomy -Have consulted General Surgery for possible cholecystectomy. Cardiology consulted for  perioperative cardiac clearance    #) Sepsis with enterococcus bacteremia -Presentation notable for leukocytosis, present with cell count of 20,100 representing interval increase compared to 2 days ago, as well as mild tachycardia.   -Blood cx pos for enterococcus -ID consulted. Recs to continue on ampicillin, repeat blood cx and to f/u on TEE -TEE scheduled for 9/12   #) Chronic biventricular systolic heart failure: -holding off on diuretic for now while in hospital -Not on an ACE inhibitor or ARB in the setting of stage IV CKD. -appears euvolemic to mildly overloaded this AM -IVF now stopped. Pt reports voiding well    #) Poorly controlled type 2 diabetes mellitus - most recent hemoglobin A1c noted to be greater than 15.5% -cont on insulin as needed while in hospital, cont to titrate dose as needed with goal of euglycemia -Pt on Januvia PTA. Given presenting pancreatitis, would avoid continuing moving forward   #) Stage IV CKD:  -renal function appears stable -repeat bmet in  AM   #) Anemia of chronic kidney disease:  -Hgb appears stable at this time -Remains hemodynamically stable -Repeat cbc in AM   #) Hyperlipidemia:  -statin currently on hold in setting of above   #) acquired hypothyroidism:  -Cont synthroid per home regimen -TSH 1.493    #) Paroxysmal atrial fibrillation:  -CHA2DS2-VASc score of 6, on eliquis -Rate controlled, on amiodarone and coreg -anticoagulation currently on hold pending TEE and surgical eval     Subjective: Feeling somewhat anxious about upcoming TEE today  Physical Exam: Vitals:   02/03/22 2320 02/04/22 0340 02/04/22 0346 02/04/22 0800  BP: (!) 112/49 137/62  135/63  Pulse: 71 70  90  Resp: '15 13  13  '$ Temp: 97.6 F (36.4 C) 97.7 F (36.5 C)    TempSrc: Oral Tympanic    SpO2: 100% 97%  99%  Weight:   108.3 kg   Height:       General exam: Conversant, in no acute distress Respiratory system: normal chest rise, clear, no audible wheezing Cardiovascular system: regular rhythm, s1-s2 Gastrointestinal system: Nondistended, nontender, pos BS Central nervous system: No seizures, no tremors Extremities: No cyanosis, no joint deformities, BLE edema Skin: No rashes, no pallor Psychiatry: Affect normal // no auditory hallucinations   Data Reviewed:  Labs reviewed: Na 130, K 5.1, Cr 3.44  Family Communication: Pt in room, family not at bedside  Disposition: Status is: Inpatient Remains inpatient appropriate because: Severity of illness  Planned Discharge Destination: Home     Author: SMarylu Lund MD 02/04/2022 10:56 AM  For on call review [email or web address]

## 2022-02-04 NOTE — Progress Notes (Addendum)
Advanced Heart Failure Team Consult Note   Primary Physician: [name], MD PCP-Cardiologist:  Glori Bickers, MD  Reason for Consultation: Preoperative cardiac evaluation  HPI:    Susan [name] is seen today for preoperative cardiac evaluation at the request of . 71 y.o. female with history of chronic biventricular CHF, PAF/AFL, morbid obesity, RBBB, DM2, hypothyroidism, CAD s/p MI 2000 and 2007 w/ BMS 2007, TIA, CKD IV and PAD s/p R transmetatarsal amputation, negative sleep study 03/22.    Has had frequent hospital admits since 2017.  Admitted 3/22 with AKI and progressive volume overloaded in setting of recurrent UTI and probable ATN. She was placed on IV abx and IV lasix w/ improved renal function and volume status. She was discharged home on 3/27 but readmitted 2 days later for urinary retention and recurrent AKI. Marland Kitchen   Admitted 10/22 with NSTEMI. Echo with decline in EF to 30-35% and new inferior WMA. Suspect progression in RCA disease. Managed medically due to CKD IV (Cr ~3.5).  GDMT limited by renal disease. Hospitalization c/b AKI and UTI.    Echo 7/23 showed EF 25-30%, severe LV dysfunction, grade II DD, RV mildly reduced.   Admitted 8/23 with DKA. Blood glucose > 1200. Ltd Echo EF 25-30%, RV moderately down. Hospitalization c/b AFib with RVR. Cards consulted and she was started on amio and Eliquis.   Patient presented with abdominal pain, nausea, diarrhea and vomiting X 2-3 days on 09/08.  Labs significnat for Scr 3.69, albumin 2.8, alk phos 404, AST 242, ALT 79, T bili 3.2, lipase 1341, WBC 20K, Hgb 11.5. She was found to have gallstone pancreatitis. BC X 2 positive for Enterococcus faecalis and E faecium bacteremia. ID consulted and now on IV Vancomycin.   Underwent ERCP yesterday with biliary sphincterotomy and balloon extraction of stones. She has been seen by general surgery for consideration of lap chole. Advanced Heart Failure asked to see for preoperative  evaluation.  Echo this admit: EF 30%, D shaped interventicular septum consistent with RV volume overload, RV low normal, RVSP 43 mmHg, severe TR  TEE today: EF 30%, moderately HK RV, moderate LAE, severe RAE, severe TR  Review of Systems: [y] = yes, '[ ]'  = no   General: Weight gain '[ ]' ; Weight loss '[ ]' ; Anorexia [Y ]; Fatigue [Y]; Fever '[ ]' ; Chills '[ ]' ; Weakness '[ ]'   Cardiac: Chest pain/pressure '[ ]' ; Resting SOB '[ ]' ; Exertional SOB '[ ]' ; Orthopnea '[ ]' ; Pedal Edema '[ ]' ; Palpitations '[ ]' ; Syncope '[ ]' ; Presyncope '[ ]' ; Paroxysmal nocturnal dyspnea'[ ]'   Pulmonary: Cough '[ ]' ; Wheezing'[ ]' ; Hemoptysis'[ ]' ; Sputum '[ ]' ; Snoring '[ ]'   GI: Vomiting[Y]; Dysphagia'[ ]' ; Melena'[ ]' ; Hematochezia '[ ]' ; Heartburn'[ ]' ; Abdominal pain [Y]; Constipation '[ ]' ; Diarrhea [Y ]; BRBPR '[ ]'   GU: Hematuria'[ ]' ; Dysuria '[ ]' ; Nocturia'[ ]'   Vascular: Pain in legs with walking '[ ]' ; Pain in feet with lying flat '[ ]' ; Non-healing sores '[ ]' ; Stroke '[ ]' ; TIA '[ ]' ; Slurred speech '[ ]' ;  Neuro: Headaches'[ ]' ; Vertigo'[ ]' ; Seizures'[ ]' ; Paresthesias'[ ]' ;Blurred vision '[ ]' ; Diplopia '[ ]' ; Vision changes '[ ]'   Ortho/Skin: Arthritis '[ ]' ; Joint pain '[ ]' ; Muscle pain '[ ]' ; Joint swelling '[ ]' ; Back Pain '[ ]' ; Rash '[ ]'   Psych: Depression'[ ]' ; Anxiety'[ ]'   Heme: Bleeding problems '[ ]' ; Clotting disorders '[ ]' ; Anemia [Y]  Endocrine: Diabetes [Y]; Thyroid dysfunction[Y]  Home Medications Prior to Admission medications   Medication Sig  Start Date End Date Taking? Authorizing Provider  albuterol (VENTOLIN HFA) 108 (90 Base) MCG/ACT inhaler Inhale 1-2 puffs into the lungs every 6 (six) hours as needed for wheezing or shortness of breath.   Yes [provider]  amiodarone (PACERONE) 200 MG tablet Take 1 tablet (200 mg total) by mouth daily. 01/29/22  Yes Milford, Maricela Bo, FNP  apixaban (ELIQUIS) 5 MG TABS tablet Take 1 tablet (5 mg total) by mouth 2 (two) times daily. 01/29/22  Yes Milford, Maricela Bo, FNP  aspirin EC 81 MG tablet Take 1 tablet (81 mg total) by  mouth daily with breakfast. 05/28/20  Yes Emokpae, Courage, MD  atorvastatin (LIPITOR) 80 MG tablet TAKE 1 TABLET BY MOUTH EVERY DAY Patient taking differently: Take 80 mg by mouth daily. 09/20/21  Yes Amair Shrout, Shaune Pascal, MD  carvedilol (COREG) 6.25 MG tablet Take 1 tablet (6.25 mg total) by mouth 2 (two) times daily with a meal. 01/29/22  Yes Milford, Cascade, FNP  colchicine 0.6 MG tablet Take 0.6 mg by mouth daily as needed (gout).   Yes [provider]  diphenoxylate-atropine (LOMOTIL) 2.5-0.025 MG tablet Take 1 tablet by mouth 4 (four) times daily as needed for diarrhea or loose stools. 08/19/21  Yes [name], MD  ferrous sulfate 325 (65 FE) MG tablet Take 325 mg by mouth 2 (two) times daily.   Yes [provider]  insulin aspart (NOVOLOG) 100 UNIT/ML FlexPen 0-9 Units, Subcutaneous, 3 times daily with meals. CBG 70-120: 0 units CBG 121-150: 1 unit CBG 151-200: 2 units CBG 201-250: 3 units CBG 251-300: 5 units CBG 301-350: 7 units CBG 351-400: 9 units CBG>400: call MD 01/23/22  Yes Ghimire, Henreitta Leber, MD  insulin detemir (LEVEMIR) 100 UNIT/ML FlexPen Inject 15 Units into the skin 2 (two) times daily. 01/23/22  Yes Ghimire, Henreitta Leber, MD  isosorbide mononitrate (IMDUR) 30 MG 24 hr tablet Take 0.5 tablets (15 mg total) by mouth daily. 12/10/21  Yes Milford, [name] M, FNP  JANUVIA 25 MG tablet TAKE 1 TABLET (25 MG TOTAL) BY MOUTH DAILY. 01/31/22  Yes [name], MD  levothyroxine (SYNTHROID, LEVOTHROID) 50 MCG tablet Take 1 tablet (50 mcg total) by mouth daily before breakfast. 11/07/16  Yes Dena Billet B, PA-C  magnesium oxide (MAG-OX) 400 (240 Mg) MG tablet Take 1 tablet (400 mg total) by mouth daily. 07/18/21  Yes [name], MD  metolazone (ZAROXOLYN) 2.5 MG tablet Take 1 tablet (2.5 mg total) by mouth as directed. Every other week 12/10/21 03/10/22 Yes Milford, Maricela Bo, FNP  Nutritional Supplements (FRUIT & VEGETABLE DAILY PO) Take 1 tablet by mouth daily.   Yes  [provider]  nystatin (MYCOSTATIN/NYSTOP) powder Apply 1 Application topically 2 (two) times daily as needed for irritation. 10/07/21  Yes [provider]  nystatin cream (MYCOSTATIN) Apply 1 Application topically 2 (two) times daily. 12/04/21  Yes [provider]  OXYGEN Inhale 2 L/min into the lungs at bedtime.   Yes [provider]  polyethylene glycol (MIRALAX / GLYCOLAX) 17 g packet Take 17 g by mouth as needed for mild constipation.   Yes [provider]  pregabalin (LYRICA) 150 MG capsule Take 150 mg by mouth 2 (two) times daily. 03/20/21  Yes [provider]  torsemide (DEMADEX) 20 MG tablet Take 4 tablets (80 mg total) by mouth 2 (two) times daily. 12/10/21  Yes Milford, Maricela Bo, FNP  TRINTELLIX 10 MG TABS tablet TAKE 1 TABLET BY MOUTH EVERY  DAY Patient taking differently: Take 10 mg by mouth daily. 10/22/21  Yes [name], MD  ursodiol (ACTIGALL) 500 MG tablet Take 500 mg by mouth 3 (three) times daily. 12/11/20  Yes [provider]  blood glucose meter kit and supplies Use up to four times daily as directed. 01/23/22   Ghimire, Henreitta Leber, MD  Fingerstix Lancets MISC Use as directed to check blood sugars as need up to 4 times daily 01/23/22   Ghimire, Henreitta Leber, MD  glucose blood test strip use as directed to check blood sugars up to 4 times daily 01/23/22   Jonetta Osgood, MD  Insulin Pen Needle 32G X 4 MM MISC Use as directed 01/23/22   Jonetta Osgood, MD  nitroGLYCERIN (NITROSTAT) 0.4 MG SL tablet PLACE 1 TABLET (0.4 MG TOTAL) UNDER THE TONGUE EVERY 5 (FIVE) MINUTES AS NEEDED FOR CHEST PAIN. Patient not taking: Reported on 01/29/2022 02/06/21   Larey Dresser, MD    Past Medical History: Past Medical History:  Diagnosis Date   Acute MI [facility]) 1999; 2007   Anemia    hx   Anginal pain ([place])    Anxiety    ARF (acute renal failure) ([place]) 06/2017   [facility]   Arthritis    "generalized"  (03/15/2014)   CAD (coronary artery disease)    MI in 2000 - MI  2007 - treated bare metal stent (no nuclear since then as 9/11)   Carotid artery disease (HCC)    CHF (congestive heart failure) ([place])    Chronic diastolic heart failure (HCC)    a) ECHO (08/2013) EF 55-60% and RV function nl b) RHC (08/2013) RA 4, RV 30/5/7, PA 25/10 (16), PCWP 7, Fick CO/CI 6.3/2.7, PVR 1.5 WU, PA 61 and 66%   Daily headache    "~ every other day; since I fell in June" (03/15/2014)   Depression    Dyslipidemia    Dyspnea    Exertional shortness of breath    HTN (hypertension)    Hypothyroidism    Neuropathy    Obesity    Osteoarthritis    Peripheral neuropathy    PONV (postoperative nausea and vomiting)    RBBB (right bundle branch block)    Old   Stroke ([place])    mini strokes   Syncope    likely due to low blood sugar   Tachycardia    Sinus tachycardia   Type II diabetes mellitus ([place])    Type II   Urinary incontinence    Venous insufficiency     Past Surgical History: Past Surgical History:  Procedure Laterality Date   ABDOMINAL HYSTERECTOMY  1980's   AMPUTATION Right 02/24/2018   Procedure: RIGHT FOOT GREAT TOE AND 2ND TOE AMPUTATION;  Surgeon: Newt Minion, MD;  Location: [place];  Service: Orthopedics;  Laterality: Right;   AMPUTATION Right 04/30/2018   Procedure: RIGHT TRANSMETATARSAL AMPUTATION;  Surgeon: Newt Minion, MD;  Location: [place];  Service: Orthopedics;  Laterality: Right;   BIOPSY  05/27/2020   Procedure: BIOPSY;  Surgeon: [name], DO;  [facility];  Service: Endoscopy;;   CATARACT EXTRACTION, BILATERAL Bilateral ?2013   COLONOSCOPY W/ POLYPECTOMY     COLONOSCOPY WITH PROPOFOL N/A 03/13/2019   Procedure: COLONOSCOPY WITH PROPOFOL;  Surgeon: Jerene Bears, MD;  Location: [place];  Service: Gastroenterology;  Laterality: N/A;   CORONARY ANGIOPLASTY WITH STENT PLACEMENT  1999; 2007   "1 + 1"  ERCP N/A 02/03/2022   Procedure: ENDOSCOPIC RETROGRADE  CHOLANGIOPANCREATOGRAPHY (ERCP);  Surgeon: Carol Ada, MD;  Location: [place];  Service: Gastroenterology;  Laterality: N/A;   ESOPHAGOGASTRODUODENOSCOPY (EGD) WITH PROPOFOL N/A 03/13/2019   Procedure: ESOPHAGOGASTRODUODENOSCOPY (EGD) WITH PROPOFOL;  Surgeon: Jerene Bears, MD;  Location: [facility] ENDOSCOPY;  Service: Gastroenterology;  Laterality: N/A;   ESOPHAGOGASTRODUODENOSCOPY (EGD) WITH PROPOFOL N/A 05/27/2020   Procedure: ESOPHAGOGASTRODUODENOSCOPY (EGD) WITH PROPOFOL;  Surgeon: [name], DO;  [facility];  Service: Endoscopy;  Laterality: N/A;   EYE SURGERY Bilateral    lazer   HEMOSTASIS CLIP PLACEMENT  03/13/2019   Procedure: HEMOSTASIS CLIP PLACEMENT;  Surgeon: Jerene Bears, MD;  Location: [facility] ENDOSCOPY;  Service: Gastroenterology;;   KNEE ARTHROSCOPY Left 10/25/2006   POLYPECTOMY  03/13/2019   Procedure: POLYPECTOMY;  Surgeon: Jerene Bears, MD;  Location: [facility] END[facility]COPY;  Service: Gastroenterology;;   REMOVAL OF STONES  02/03/2022   Procedure: REMOVAL OF STONES;  Surgeon: Carol Ada, MD;  Location: [place];  Service: Gastroenterology;;   RIGHT HEART CATH N/A 07/24/2017   Procedure: RIGHT HEART CATH;  Surgeon: Jolaine Artist, MD;  Location: [place] CV LAB;  Service: Cardiovascular;  Laterality: N/A;   RIGHT HEART CATHETERIZATION N/A 09/22/2013   Procedure: RIGHT HEART CATH;  Surgeon: Jolaine Artist, MD;  Location: [facility] CATH LAB;  Service: Cardiovascular;  Laterality: N/A;   SHOULDER ARTHROSCOPY WITH OPEN ROTATOR CUFF REPAIR Right 03/14/2014   Procedure: RIGHT SHOULDER ARTHROSCOPY WITH BICEPS RELEASE, OPEN SUBSCAPULA REPAIR, OPEN SUPRASPINATUS REPAIR.;  Surgeon: Meredith Pel, MD;  Location: [place];  Service: Orthopedics;  Laterality: Right;   SPHINCTEROTOMY  02/03/2022   Procedure: SPHINCTEROTOMY;  Surgeon: Carol Ada, MD;  Location: [facility] ENDOSCOPY;  Service: Gastroenterology;;   TOE AMPUTATION Right 02/24/2018   GREAT TOE AND 2ND TOE  AMPUTATION   TUBAL LIGATION  12's    Family History: Family History  Problem Relation Age of Onset   Heart attack Mother 12    Social History: Social History   Socioeconomic History   Marital status: Married    Spouse name: Percell Miller   Number of children: 3   Years of education: 12th   Highest education level: Not on file  Occupational History    Employer: UNEMPLOYED  Tobacco Use   Smoking status: Former    Packs/day: 3.00    Years: 32.00    Total pack years: 96.00    Types: Cigarettes    Quit date: 10/24/1997    Years since quitting: 24.2   Smokeless tobacco: Never  Vaping Use   Vaping Use: Never used  Substance and Sexual Activity   Alcohol use: Not Currently    Comment: "might have 2-3 daiquiris in the summer"   Drug use: No   Sexual activity: Not Currently  Other Topics Concern   Not on file  Social History Narrative   Pt lives at home with her spouse.Caffeine Use- 3 sodas daily.   Social Determinants of Health   Financial Resource Strain: Low Risk  (08/16/2021)   Overall Financial Resource Strain (CARDIA)    Difficulty of Paying Living Expenses: Not hard at all  Food Insecurity: No Food Insecurity (08/16/2021)   Hunger Vital Sign    Worried About Running Out of Food in the Last Year: Never true    Ran Out of Food in the Last Year: Never true  Transportation Needs: No Transportation Needs (08/16/2021)   PRAPARE - Hydrologist (Medical):  No    Lack of Transportation (Non-Medical): No  Physical Activity: Inactive (08/16/2021)   Exercise Vital Sign    Days of Exercise per Week: 0 days    Minutes of Exercise per Session: 0 min  Stress: No Stress Concern Present (08/16/2021)   [place]    Feeling of Stress : Not at all  Social Connections: Moderately Isolated (08/16/2021)   Social Connection and Isolation Panel [NHANES]    Frequency of Communication with Friends and  Family: More than three times a week    Frequency of Social Gatherings with Friends and Family: More than three times a week    Attends Religious Services: Never    Marine scientist or Organizations: No    Attends Archivist Meetings: Never    Marital Status: Married    Allergies:  Allergies  Allergen Reactions   Keflex [Cephalexin] Diarrhea   Codeine Nausea And Vomiting and Other (See Comments)    Objective:    Vital Signs:   Temp:  [97 F (36.1 C)-98.4 F (36.9 C)] 97 F (36.1 C) (09/12 1213) Pulse Rate:  [65-90] 78 (09/12 1213) Resp:  [11-15] 11 (09/12 1213) BP: (97-140)/(37-124) 132/69 (09/12 1213) SpO2:  [94 %-100 %] 100 % (09/12 1213) Weight:  [108.3 kg] 108.3 kg (09/12 0346) Last BM Date : 02/02/22  Weight change: Filed Weights   02/03/22 0420 02/03/22 1254 02/04/22 0346  Weight: 107.9 kg 107.9 kg 108.3 kg    Intake/Output:   Intake/Output Summary (Last 24 hours) at 02/04/2022 1335 Last data filed at 02/04/2022 1059 Gross per 24 hour  Intake 440 ml  Output 1650 ml  Net -1210 ml      Physical Exam    General:  Lying comfortably in bed HEENT: normal Neck: supple. JVP 7-8 cm. Carotids 2+ bilat; no bruits.  Cor: PMI nondisplaced. Regular rate & rhythm. No rubs, gallops, 2/6 TR murmur Lungs: clear Abdomen: obese, soft, nontender, nondistended.  Extremities: no cyanosis, clubbing, rash, 1+ edema, chronic venous stasis changes Neuro: alert & orientedx3, cranial nerves grossly intact. moves all 4 extremities w/o difficulty. Affect pleasant   Telemetry   SR 70s  EKG    SR 74 bpm, RBBB, inferior Qs  Labs   Basic Metabolic Panel: Recent Labs  Lab 02/01/22 0615 02/01/22 1314 02/02/22 0121 02/03/22 0600 02/04/22 0542  NA 132* 135 129* 133* 130*  K 3.9 4.2 4.1 4.0 5.1  CL 90* 93* 91* 96* 94*  CO2 '26 30 25 24 23  ' GLUCOSE 246* 136* 379* 149* 391*  BUN 94* 96* 91* 85* 84*  CREATININE 3.93* 4.03* 4.01* 3.71* 3.44*  CALCIUM 8.5*  8.5* 8.3* 8.3* 8.4*  MG 1.8  --   --   --   --     Liver Function Tests: Recent Labs  Lab 02/01/22 0615 02/01/22 1314 02/02/22 0121 02/03/22 0600 02/04/22 0542  AST 114* 90* 56* 27 23  ALT 61* 56* 44 33 31  ALKPHOS 273* 300* 237* 205* 255*  BILITOT 1.0 0.7 0.9 0.9 1.0  PROT 5.4* 5.6* 5.3* 5.2* 5.7*  ALBUMIN 2.4* 2.3* 2.2* 2.1* 2.3*   Recent Labs  Lab 01/31/22 2242 02/01/22 1314 02/02/22 0121  LIPASE 1,341* 112* 60*   No results for input(s): "AMMONIA" in the last 168 hours.  CBC: Recent Labs  Lab 02/01/22 0615 02/01/22 1314 02/02/22 0119 02/03/22 0600 02/04/22 0542  WBC 25.5* 18.5* 10.4 6.1 3.7*  NEUTROABS 23.9*  --   --   --   --  HGB 9.7* 9.5* 9.3* 9.1* 9.6*  HCT 29.5* 29.5* 29.2* 28.1* 30.3*  MCV 84.3 84.5 83.4 84.1 84.2  PLT 226 202 160 158 150    Cardiac Enzymes: No results for input(s): "CKTOTAL", "CKMB", "CKMBINDEX", "TROPONINI" in the last 168 hours.  BNP: BNP (last 3 results) Recent Labs    01/15/22 0132 01/29/22 1147 02/01/22 0615  BNP 1,929.8* 891.1* 451.5*    ProBNP (last 3 results) No results for input(s): "PROBNP" in the last 8760 hours.   CBG: Recent Labs  Lab 02/03/22 1703 02/03/22 2326 02/04/22 0611 02/04/22 1101 02/04/22 1222  GLUCAP 220* 400* 383* 296* 317*    Coagulation Studies: No results for input(s): "LABPROT", "INR" in the last 72 hours.   Imaging   DG ERCP  Result Date: 02/03/2022 CLINICAL DATA:  ERCP for choledocholithiasis. EXAM: ERCP TECHNIQUE: Multiple spot images obtained with the fluoroscopic device and submitted for interpretation post-procedure. FLUOROSCOPY TIME: FLUOROSCOPY TIME 4 minutes, 15 seconds (27 mGy) COMPARISON:  MRCP-02/01/2022 FINDINGS: Four spot intraoperative fluoroscopic images the right upper abdominal quadrant during ERCP are provided for review Initial image demonstrates an ERCP probe overlying the right upper abdominal quadrant. Subsequent images demonstrate selective cannulation and  opacification of the common bile duct which appears moderately dilated. There is faint opacification of the intrahepatic biliary tree which appears nondilated. There is no definitive opacification of the pancreatic duct. There are several filling defects within the gallbladder compatible with known cholelithiasis. IMPRESSION: 1. ERCP as above. 2. Cholelithiasis as demonstrated on preceding MRCP. These images were submitted for radiologic interpretation only. Please see the procedural report for the amount of contrast and the fluoroscopy time utilized. Electronically Signed   By: Sandi Mariscal M.D.   On: 02/03/2022 15:21     Medications:     Current Medications:  [MAR Hold] amiodarone  200 mg Oral Daily   [MAR Hold] carvedilol  6.25 mg Oral BID WC   [MAR Hold] insulin aspart  0-9 Units Subcutaneous Q6H   [MAR Hold] insulin detemir  12 Units Subcutaneous BID   [MAR Hold] levothyroxine  50 mcg Oral QAC breakfast    Infusions:  [MAR Hold] vancomycin        Patient Profile   71 y.o. female with history of chronic biventricular CHF, CAD, morbid obesity, DM II, CKD 4, PAF/AFL. Admitted with gallstone pancreatitis and CBD stone s/p extraction in addition to Enterococcus faecalis and E. Faecium bacteremia.  Assessment/Plan   Gallstone pancreatitis - Lipase 1341>112>60 - s/p ERCP on 09/11 with biliary sphincterotomy and balloon extraction - No evidence of cholecystitis on imaging - General surgery consulted, considering lap chole - Her surgical risk for cardiac complications and progression of CKD to point of dialysis is high. Will arrange for RHC tomorrow to assess hemodynamics and cardiac output. If cardiac output is reduced, her surgical risk would be prohibitive. Fortunately, she seems to be improving with current treatment.  Enterococcus faecalis and E faecium bacteremia - Repeat BC X 2 09/11 NGTD - GI source suspected - ID following - On IV vancomycin - WBC 20K >> 3.7K. AF. - No  evidence of endocarditis on TEE  Chronic biventricular CHF - Echo (9/21): EF 55-60%, RV normal  - Echo (10/22): EF down to 30-35% in setting of NSTEMI, Grade II DD. - Echo (12/10/21): EF 25-30%, RV mildly reduced. - Ltd Echo (8/23): EF 30-35%, RV moderately reduced - Echo this admit: EF 30%, interventricular septum flattened in diastole consistent with RV volume overload, RV low normal, severe  TR - Volume looks okay on exam. Diuretics held on admit d/t infection. RHC tomorrow to assess volume and hemodynamics as above - Continue Coreg 6.25 BID - GDMT has been limited by hx hypotension and CKD  Paroxysmal AF/AFL - New during 8/23 admit - Currently SR. Continue amiodarone 200 mg daily - Anticoagulation has been on hold pending possible surgery. On Eliquis 5 BID.  CKD IV - Baseline Scr ~ 3.5 - Scr up to 4, improved to 3.4 today - BP initially soft. Now improving. Avoid hypotension.  Uncontrolled DM II - Hgb A1c 15 in 08/23 - No SGLT2i - Management per primary team  Anemia of CKD - Hgb stable in 9s - Monitor   Length of Stay: 3  FINCH, LINDSAY N, PA-C  02/04/2022, 1:35 PM  Advanced Heart Failure Team Pager [phone] (M-F; 7a - 5p)  Please contact [facility] for night-coverage after hours (4p -7a ) and weekends on [email or web address]   Patient seen and examined with the above-signed Advanced Practice Provider and/or Housestaff. I personally reviewed laboratory data, imaging studies and relevant notes. I independently examined the patient and formulated the important aspects of the plan. I have edited the note to reflect any of my changes or salient points. I have personally discussed the plan with the patient and/or family.  Patient well known to me from HF Clinic.   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   Admitted with gallstone pancreatitis and enterococcus bacteremia. Echo today with LVEF  30% RV moderately HK and severe TR. No vegetation.   At baseline not very active. NYHA Class III-IIIb symptoms.   We are asked to assess for pre-op risk stratification   General:  Weak appearing. No resp difficulty HEENT: normal Neck: supple. JVP 10 . Carotids 2+ bilat; no bruits. No lymphadenopathy or thryomegaly appreciated. Cor: PMI nondisplaced. Regular rate & rhythm. 2/6 TR Lungs: clear Abdomen: obese soft, nontender, nondistended. No hepatosplenomegaly. No bruits or masses. Good bowel sounds. Extremities: no cyanosis, clubbing, rash, 1+ edema Neuro: alert & orientedx3, cranial nerves grossly intact. moves all 4 extremities w/o difficulty. Affect pleasant  Given her comorbidities she would be extremely high risk for any surgical procedures especially if the require GA. That said, she tolerated sedation for TEE very well and may be able to handle lap chole if felt to be needed. We will proceed with RHC to further risk stratify, if PA pressures significantly elevated or cardiac index < 2.0 then I think risk would be prohibitive.   Glori Bickers, MD  6:08 PM

## 2022-02-04 NOTE — CV Procedure (Signed)
TRANSESOPHAGEAL ECHOCARDIOGRAM   NAME:  Susan [name]   MRN: [id number] DOB:  03/20/1951   ADMIT DATE: 01/31/2022  INDICATIONS:  Bacteremia  OPERATORS: Sabharwal, Tallin Hart  PROCEDURE:   Informed consent was obtained prior to the procedure. The risks, benefits and alternatives for the procedure were discussed and the patient comprehended these risks.  Risks include, but are not limited to, cough, sore throat, vomiting, nausea, somnolence, esophageal and stomach trauma or perforation, bleeding, low blood pressure, aspiration, pneumonia, infection, trauma to the teeth and death.    After a procedural time-out, the patient was sedated by the anesthesia service. Once an appropriate level of sedation was achieved, the transesophageal probe was inserted in the esophagus and stomach without difficulty and multiple views were obtained.    COMPLICATIONS:    There were no immediate complications.  FINDINGS:  LEFT VENTRICLE: EF = 30%. Global HK  RIGHT VENTRICLE: Moderately dilated. Moderately HK.  LEFT ATRIUM: Moderately dilated   LEFT ATRIAL APPENDAGE: No thrombus.   RIGHT ATRIUM: Severely dilated   AORTIC VALVE:  Trileaflet. Mildly calcified, No AS. 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 No vegetation   MITRAL VALVE:    Normal. Trivial MR. No vegetation  TRICUSPID VALVE: Normal. Severe central TR. No vegetation   PULMONIC VALVE: Normal. Trivial PR. No vegetation   INTERATRIAL SEPTUM: No PFO or ASD.  PERICARDIUM: No effusion  DESCENDING AORTA: Moderate plaque    CONCLUSION:  No TEE evidence of endocarditis  [name] 1:33 PM

## 2022-02-04 NOTE — Anesthesia Postprocedure Evaluation (Signed)
Anesthesia Post Note  Patient: Taraya Steward  Procedure(s) Performed: TRANSESOPHAGEAL ECHOCARDIOGRAM (TEE)     Patient location during evaluation: PACU Anesthesia Type: MAC Level of consciousness: awake and alert Pain management: pain level controlled Vital Signs Assessment: post-procedure vital signs reviewed and stable Respiratory status: spontaneous breathing, nonlabored ventilation, respiratory function stable and patient connected to nasal cannula oxygen Cardiovascular status: stable and blood pressure returned to baseline Postop Assessment: no apparent nausea or vomiting Anesthetic complications: no   No notable events documented.  Last Vitals:  Vitals:   02/04/22 1403 02/04/22 1542  BP: (!) 103/47 (!) 107/51  Pulse: 64 71  Resp: 19 19  Temp:  (!) 36.4 C  SpO2: 99% 100%    Last Pain:  Vitals:   02/04/22 1542  TempSrc: Oral  PainSc:                  Effie Berkshire

## 2022-02-04 NOTE — Transfer of Care (Signed)
Immediate Anesthesia Transfer of Care Note  Patient: Susan [name]  Procedure(s) Performed: TRANSESOPHAGEAL ECHOCARDIOGRAM (TEE)  Patient Location: Endoscopy Unit  Anesthesia Type:MAC  Level of Consciousness: awake, alert  and oriented  Airway & Oxygen Therapy: Patient Spontanous Breathing and Patient connected to nasal cannula oxygen  Post-op Assessment: Report given to RN and Post -op Vital signs reviewed and stable  Post vital signs: Reviewed and stable  Last Vitals:  Vitals Value Taken Time  BP 111/61 02/04/22 1352  Temp    Pulse 62 02/04/22 1353  Resp 14 02/04/22 1353  SpO2 98 % 02/04/22 1353  Vitals shown include unvalidated device data.  Last Pain:  Vitals:   02/04/22 1213  TempSrc: Temporal  PainSc: 0-No pain         Complications: No notable events documented.

## 2022-02-04 NOTE — Progress Notes (Signed)
[facility]  Date of Admission:  01/31/2022   Total days of inpatient antibiotics 2  Principal Problem:   Acute pancreatitis Active Problems:   Hyperlipemia   Chronic HFrEF (heart failure with reduced ejection fraction) (HCC)   CKD (chronic kidney disease), stage IV (HCC)   Nausea & vomiting   Hypothyroidism   Type 2 diabetes mellitus with diabetic polyneuropathy, with long-term current use of insulin (HCC)   Abdominal pain   SIRS (systemic inflammatory response syndrome) (HCC)   Transaminitis   History of anemia due to chronic kidney disease   Paroxysmal atrial fibrillation (HCC)          Assessment: [name] with:  #Enterococcus faecalis and E faecium bacteremia suspect 2/2 GI source #Choledocholithiasis with mild pancreatitis #Dm-well controlled #Onychomycosis #CKD -TTE showed more thickened and calcified aortic valve compared to prior study -No diarrhea today, continues to have nausea - Abdominal ultrasound showed gallstones with prominent CBD no CBD stones visualized. -GI was consulted.  MRCP showed choledocholithiasis with mild pancreatitis(2 mm stone in the dependent portion of distal common bile duct and gentle tapering of the duct at level of the ampulla).   -ERCP on 9/11 showed choledocholithiasis.  Patient underwent biliary sphincterectomy and balloon extraction. Recommendations: -Continue vancomycin(E facieum is Vanc S) -Follow repeat blood Cx to ensure clearance -TEE today  Microbiology:   Antibiotics: 9/9 Ampicillin  Vancomycin 9/10 Cultures: Blood 9/8 NG    SUBJECTIVE: Resting in bed. Reports diarrhea has not come back.  Interval: afebrile , wbc 3.7k Review of Systems: Review of Systems  All other systems reviewed and are negative.    Scheduled Meds:  amiodarone  200 mg Oral Daily   carvedilol  6.25 mg Oral BID WC   insulin aspart  0-9 Units Subcutaneous Q6H   insulin detemir  12 Units Subcutaneous BID    levothyroxine  50 mcg Oral QAC breakfast   Continuous Infusions:  vancomycin     PRN Meds:.acetaminophen **OR** acetaminophen, fentaNYL (SUBLIMAZE) injection, loperamide, LORazepam, naLOXone (NARCAN)  injection, mouth rinse Allergies  Allergen Reactions   Keflex [Cephalexin] Diarrhea   Codeine Nausea And Vomiting and Other (See Comments)    OBJECTIVE: Vitals:   02/04/22 0340 02/04/22 0346 02/04/22 0800 02/04/22 1058  BP: 137/62  135/63 (!) 131/59  Pulse: 70  90 66  Resp: '13  13 12  '$ Temp: 97.7 F (36.5 C)   98 F (36.7 C)  TempSrc: Tympanic   Oral  SpO2: 97%  99% 97%  Weight:  108.3 kg    Height:       Body mass index is 38.54 kg/m.  Physical Exam Constitutional:      Appearance: Normal appearance.  HENT:     Head: Normocephalic and atraumatic.     Right Ear: Tympanic membrane normal.     Left Ear: Tympanic membrane normal.     Nose: Nose normal.     Mouth/Throat:     Mouth: Mucous membranes are moist.  Eyes:     Extraocular Movements: Extraocular movements intact.     Conjunctiva/sclera: Conjunctivae normal.     Pupils: Pupils are equal, round, and reactive to light.  Cardiovascular:     Rate and Rhythm: Normal rate and regular rhythm.     Heart sounds: No murmur heard.    No friction rub. No gallop.  Pulmonary:     Effort: Pulmonary effort is normal.     Breath sounds: Normal breath  sounds.  Abdominal:     General: Abdomen is flat.     Palpations: Abdomen is soft.  Musculoskeletal:        General: Normal range of motion.  Skin:    General: Skin is warm and dry.  Neurological:     General: No focal deficit present.     Mental Status: She is alert and oriented to person, place, and time.  Psychiatric:        Mood and Affect: Mood normal.       Lab Results Lab Results  Component Value Date   WBC 3.7 (L) 02/04/2022   HGB 9.6 (L) 02/04/2022   HCT 30.3 (L) 02/04/2022   MCV 84.2 02/04/2022   PLT 150 02/04/2022    Lab Results  Component Value Date    CREATININE 3.44 (H) 02/04/2022   BUN 84 (H) 02/04/2022   NA 130 (L) 02/04/2022   K 5.1 02/04/2022   CL 94 (L) 02/04/2022   CO2 23 02/04/2022    Lab Results  Component Value Date   ALT 31 02/04/2022   AST 23 02/04/2022   GGT 144 (H) 02/01/2022   ALKPHOS 255 (H) 02/04/2022   BILITOT 1.0 02/04/2022        [name], [facility] 02/04/2022, 11:02 AM

## 2022-02-04 NOTE — Interval H&P Note (Signed)
History and Physical Interval Note:  02/04/2022 11:59 AM  Susan [name]  has presented today for surgery, with the diagnosis of bacteremia.  The various methods of treatment have been discussed with the patient and family. After consideration of risks, benefits and other options for treatment, the patient has consented to  Procedure(s): TRANSESOPHAGEAL ECHOCARDIOGRAM (TEE) (N/A) as a surgical intervention.  The patient's history has been reviewed, patient examined, no change in status, stable for surgery.  I have reviewed the patient's chart and labs.  Questions were answered to the patient's satisfaction.     [name]

## 2022-02-05 ENCOUNTER — Encounter (HOSPITAL_COMMUNITY)
Admission: EM | Disposition: A | Discharge status: Home / Self Care | Payer: Self-pay | Source: Home / Self Care | Attending: Internal Medicine

## 2022-02-05 DIAGNOSIS — I5022 Chronic systolic (congestive) heart failure: Secondary | ICD-10-CM | POA: Diagnosis not present

## 2022-02-05 DIAGNOSIS — R7881 Bacteremia: Secondary | ICD-10-CM | POA: Diagnosis not present

## 2022-02-05 DIAGNOSIS — N184 Chronic kidney disease, stage 4 (severe): Secondary | ICD-10-CM | POA: Diagnosis not present

## 2022-02-05 DIAGNOSIS — K851 Biliary acute pancreatitis without necrosis or infection: Secondary | ICD-10-CM | POA: Diagnosis not present

## 2022-02-05 DIAGNOSIS — A419 Sepsis, unspecified organism: Secondary | ICD-10-CM

## 2022-02-05 LAB — BASIC METABOLIC PANEL
Anion gap: 10 (ref 5–15)
BUN: 84 mg/dL — ABNORMAL HIGH (ref 8–23)
CO2: 22 mmol/L (ref 22–32)
Calcium: 8.4 mg/dL — ABNORMAL LOW (ref 8.9–10.3)
Chloride: 99 mmol/L (ref 98–111)
Creatinine, Ser: 3.19 mg/dL — ABNORMAL HIGH (ref 0.44–1.00)
GFR, Estimated: 15 mL/min — ABNORMAL LOW (ref >60)
Glucose, Bld: 299 mg/dL — ABNORMAL HIGH (ref 70–99)
Potassium: 5 mmol/L (ref 3.5–5.1)
Sodium: 131 mmol/L — ABNORMAL LOW (ref 135–145)

## 2022-02-05 LAB — BLOOD CULTURE ID PANEL (REFLEXED) - BCID2
A.calcoaceticus-baumannii: NOT DETECTED
Bacteroides fragilis: NOT DETECTED
Candida albicans: NOT DETECTED
Candida auris: NOT DETECTED
Candida glabrata: NOT DETECTED
Candida krusei: NOT DETECTED
Candida parapsilosis: NOT DETECTED
Candida tropicalis: NOT DETECTED
Cryptococcus neoformans/gattii: NOT DETECTED
Enterobacter cloacae complex: NOT DETECTED
Enterobacterales: NOT DETECTED
Enterococcus Faecium: DETECTED — AB
Enterococcus faecalis: DETECTED — AB
Escherichia coli: NOT DETECTED
Haemophilus influenzae: NOT DETECTED
Klebsiella aerogenes: NOT DETECTED
Klebsiella oxytoca: NOT DETECTED
Klebsiella pneumoniae: NOT DETECTED
Listeria monocytogenes: NOT DETECTED
Neisseria meningitidis: NOT DETECTED
Proteus species: NOT DETECTED
Pseudomonas aeruginosa: NOT DETECTED
Salmonella species: NOT DETECTED
Serratia marcescens: NOT DETECTED
Staphylococcus aureus (BCID): NOT DETECTED
Staphylococcus epidermidis: NOT DETECTED
Staphylococcus lugdunensis: NOT DETECTED
Staphylococcus species: NOT DETECTED
Stenotrophomonas maltophilia: NOT DETECTED
Streptococcus agalactiae: NOT DETECTED
Streptococcus pneumoniae: NOT DETECTED
Streptococcus pyogenes: NOT DETECTED
Streptococcus species: NOT DETECTED
Vancomycin resistance: NOT DETECTED

## 2022-02-05 LAB — URINALYSIS, ROUTINE W REFLEX MICROSCOPIC
Bilirubin Urine: NEGATIVE
Glucose, UA: >=500 mg/dL — AB
Ketones, ur: NEGATIVE mg/dL
Nitrite: NEGATIVE
Protein, ur: 30 mg/dL — AB
Specific Gravity, Urine: 1.01 (ref 1.005–1.030)
WBC, UA: >50 WBC/hpf — ABNORMAL HIGH (ref 0–5)
pH: 5 (ref 5.0–8.0)

## 2022-02-05 LAB — CBC
HCT: 29 % — ABNORMAL LOW (ref 36.0–46.0)
Hemoglobin: 9.1 g/dL — ABNORMAL LOW (ref 12.0–15.0)
MCH: 26.6 pg (ref 26.0–34.0)
MCHC: 31.4 g/dL (ref 30.0–36.0)
MCV: 84.8 fL (ref 80.0–100.0)
Platelets: 138 10*3/uL — ABNORMAL LOW (ref 150–400)
RBC: 3.42 MIL/uL — ABNORMAL LOW (ref 3.87–5.11)
RDW: 15.9 % — ABNORMAL HIGH (ref 11.5–15.5)
WBC: 5 10*3/uL (ref 4.0–10.5)
nRBC: 0 % (ref 0.0–0.2)

## 2022-02-05 LAB — GLUCOSE, CAPILLARY
Glucose-Capillary: 281 mg/dL — ABNORMAL HIGH (ref 70–99)
Glucose-Capillary: 283 mg/dL — ABNORMAL HIGH (ref 70–99)
Glucose-Capillary: 413 mg/dL — ABNORMAL HIGH (ref 70–99)

## 2022-02-05 SURGERY — RIGHT HEART CATH
Anesthesia: LOCAL

## 2022-02-05 MED ORDER — SODIUM CHLORIDE 0.9 % IV SOLN
2.0000 g | INTRAVENOUS | Status: AC
  Administered 2022-02-05 – 2022-02-07 (×3): 2 g via INTRAVENOUS
  Filled 2022-02-05 (×3): qty 20

## 2022-02-05 MED ORDER — SENNOSIDES-DOCUSATE SODIUM 8.6-50 MG PO TABS
1.0000 | ORAL_TABLET | Freq: Two times a day (BID) | ORAL | Status: DC
  Administered 2022-02-05 – 2022-02-10 (×6): 1 via ORAL
  Filled 2022-02-05 (×9): qty 1

## 2022-02-05 MED ORDER — APIXABAN 5 MG PO TABS
5.0000 mg | ORAL_TABLET | Freq: Two times a day (BID) | ORAL | Status: DC
  Administered 2022-02-05 – 2022-02-11 (×13): 5 mg via ORAL
  Filled 2022-02-05 (×13): qty 1

## 2022-02-05 MED ORDER — INSULIN DETEMIR 100 UNIT/ML ~~LOC~~ SOLN
15.0000 [IU] | Freq: Two times a day (BID) | SUBCUTANEOUS | Status: DC
  Administered 2022-02-05 – 2022-02-11 (×13): 15 [IU] via SUBCUTANEOUS
  Filled 2022-02-05 (×15): qty 0.15

## 2022-02-05 MED ORDER — SORBITOL 70 % SOLN
30.0000 mL | Status: AC
  Administered 2022-02-05 (×2): 30 mL via ORAL
  Filled 2022-02-05 (×2): qty 30

## 2022-02-05 MED ORDER — SODIUM CHLORIDE 0.9 % IV SOLN
800.0000 mg | INTRAVENOUS | Status: DC
  Administered 2022-02-06 – 2022-02-10 (×3): 800 mg via INTRAVENOUS
  Filled 2022-02-05 (×3): qty 16

## 2022-02-05 NOTE — Progress Notes (Signed)
PROGRESS NOTE    Susan [name]  PQZ:[id number] DOB: 06-Apr-1951 DOA: 01/31/2022 PCP: [name], MD    Chief Complaint  Patient presents with   Abdominal Pain    Emesis/Diarrhea    Brief Narrative:  32LK with hx systolic chf, DM, CKD 4, afib on eliquis presented with epigastric pain with n/v admitted for acute gallstone pancreatitis.   Assessment & Plan:   Principal Problem:   Acute pancreatitis Active Problems:   Hyperlipemia   Chronic HFrEF (heart failure with reduced ejection fraction) (HCC)   CKD (chronic kidney disease), stage IV (HCC)   Nausea & vomiting   Hypothyroidism   Type 2 diabetes mellitus with diabetic polyneuropathy, with long-term current use of insulin (HCC)   Abdominal pain   SIRS (systemic inflammatory response syndrome) (HCC)   Transaminitis   History of anemia due to chronic kidney disease   Paroxysmal atrial fibrillation (HCC)   Bacteremia   Sepsis without acute organ dysfunction (HCC)  #1 acute gallstone pancreatitis -Patient presented with epigastric abdominal pain, nausea vomiting, lipase levels noted to be elevated at 1341. -Abdominal ultrasound done with gallstones with prominent CBD, no cholecystitis noted.   -Patient seen in consultation by GI, underwent MRCP which showed choledocholithiasis with mild pancreatitis 2 mm stone in the dependent portion of the distal common bile duct and gentle tapering of the duct at the level of the ampulla. -Patient subsequently underwent ERCP on 02/03/2022 consistent with choledocholithiasis, patient underwent biliary sphincterotomy and balloon extraction. -Patient seen in consultation by general surgery with no plans for laparoscopic cholecystectomy during this hospitalization as patient noted not to have an acute infection noted to be high surgical risk for cardiac complication from cardiac standpoint with CKD. -Continue IV antibiotics for bacteremia. -ID following, GI following, general surgery  following. -Diet advanced per general surgery.  2.  Sepsis with Enterococcus bacteremia, POA -On presentation patient noted to have a leukocytosis white count of 20.1, interval increase compared to 2 days ago, mild tachycardia. -Blood cultures obtained positive for Enterococcus with repeat blood cultures pending with no growth to date. -Patient seen by ID, initial recommendations was to come continue ampicillin and follow-up on TEE. -TEE done per cardiology 02/04/2022 negative for endocarditis. -IV antibiotics switched to IV vancomycin per ID who are following. -Per ID patient will need PICC line placed if repeat blood cultures are negative x72 hours and will need a 2-week course of IV antibiotics from negative culture. -Per ID.  3.  Chronic biventricular systolic heart failure -Diuretics on hold while in-house. -Due to CKD stage IV noted not on ACE inhibitor or ARB. -Patient with some diffuse scattered crackles however seems mostly euvolemic. -IV fluids stopped. -May need to resume home regimen diuretics in the next 1 to 2 days however will defer to cardiology who are following.  4.  Poorly controlled type 2 diabetes mellitus -Most recent hemoglobin A1c > 15.5 (01/20/2022) -CBG of 281 this morning. -Increase Levemir to 15 units twice daily.  SSI. -Patient noted to be on Januvia prior to admission however given presentation of pancreatitis will likely not resume on discharge.  5.  Hypothyroidism -Synthroid.  6.  Dysuria/?  UTI -Patient complains of some dysuria and suprapubic abdominal pain. -Urinalysis done concerning for UTI. -Urine cultures pending. -Start IV Rocephin pending urine culture results.  7.  CKD stage IV -Stable. -Pete labs in the AM.  8.  Anemia of chronic kidney disease -H-stable.  9.  Hyperlipidemia -Statin on hold, likely resume on discharge.  10.  Paroxysmal atrial fibrillation, CHA2DS2VASC score 6 -Continue amiodarone for rate control. -Patient cleared  by general surgery for resumption of Eliquis today which has been resumed.    DVT prophylaxis: Eliquis Code Status: Full Family Communication:  Disposition: Likely home when clinically improved, tolerating current diet, and cleared by GI, general surgery.  Status is: Inpatient Remains inpatient appropriate because: Severity of illness   Consultants:  Gastroenterology: Dr. Silverio Decamp 02/01/2022 ID: Dr. Tommy Medal 02/02/2022 General surgery: Dr. Rosendo Gros 02/04/2022  Procedures:  Chest x-ray 02/01/2022 ERCP 02/03/2022 per Dr. Benson Norway TEE 02/04/2022 per Dr. Haroldine Laws MRCP 02/01/2022 Renal ultrasound 02/02/2022 Right upper quadrant ultrasound 02/01/2022 2D echo 02/02/2022 Bilateral upper extremity Dopplers 01/30/2022  Antimicrobials:  IV ampicillin 02/01/2022>>> 02/02/2022 IV vancomycin 02/02/2022 >>>> IV Rocephin 02/05/2022>>>>   Subjective: Sitting up in chair.  C/o abd cramping after eating today. No CP. No sob.  Patient with some complaints of dysuria and lower abdominal discomfort.  States has not had a bowel movement in approximately 5 days.   Objective: Vitals:   02/05/22 0340 02/05/22 0400 02/05/22 0738 02/05/22 1603  BP: (!) 128/44  (!) 117/59 137/61  Pulse: 65  65 69  Resp: '13  13 10  '$ Temp: 97.8 F (36.6 C)  97.6 F (36.4 C) (!) 97.4 F (36.3 C)  TempSrc: Oral  Oral Oral  SpO2: 97%  100% 98%  Weight:  108.9 kg    Height:        Intake/Output Summary (Last 24 hours) at 02/05/2022 1851 Last data filed at 02/05/2022 1748 Gross per 24 hour  Intake 390.94 ml  Output 800 ml  Net -409.06 ml   Filed Weights   02/03/22 1254 02/04/22 0346 02/05/22 0400  Weight: 107.9 kg 108.3 kg 108.9 kg    Examination:  General exam: Appears calm and comfortable  Respiratory system: Some scattered crackles.  No rhonchi.  No wheezing.  Fair air movement.  Cardiovascular system: S1 & S2 heard, RRR. No JVD, murmurs, rubs, gallops or clicks. No pedal edema. Gastrointestinal system: Abdomen is  nondistended, soft and some suprapubic tenderness to palpation.  Positive bowel sounds.  No organomegaly.  Central nervous system: Alert and oriented. No focal neurological deficits. Extremities: Symmetric 5 x 5 power. Skin: No rashes, lesions or ulcers Psychiatry: Judgement and insight appear normal. Mood & affect appropriate.     Data Reviewed: I have personally reviewed following labs and imaging studies  CBC: Recent Labs  Lab 02/01/22 0615 02/01/22 1314 02/02/22 0119 02/03/22 0600 02/04/22 0542 02/05/22 0600  WBC 25.5* 18.5* 10.4 6.1 3.7* 5.0  NEUTROABS 23.9*  --   --   --   --   --   HGB 9.7* 9.5* 9.3* 9.1* 9.6* 9.1*  HCT 29.5* 29.5* 29.2* 28.1* 30.3* 29.0*  MCV 84.3 84.5 83.4 84.1 84.2 84.8  PLT 226 202 160 158 150 138*    Basic Metabolic Panel: Recent Labs  Lab 02/01/22 0615 02/01/22 1314 02/02/22 0121 02/03/22 0600 02/04/22 0542 02/05/22 0600  NA 132* 135 129* 133* 130* 131*  K 3.9 4.2 4.1 4.0 5.1 5.0  CL 90* 93* 91* 96* 94* 99  CO2 '26 30 25 24 23 22  '$ GLUCOSE 246* 136* 379* 149* 391* 299*  BUN 94* 96* 91* 85* 84* 84*  CREATININE 3.93* 4.03* 4.01* 3.71* 3.44* 3.19*  CALCIUM 8.5* 8.5* 8.3* 8.3* 8.4* 8.4*  MG 1.8  --   --   --   --   --     GFR: Estimated Creatinine  Clearance: 19.9 mL/min (A) (by C-G formula based on SCr of 3.19 mg/dL (H)).  Liver Function Tests: Recent Labs  Lab 02/01/22 0615 02/01/22 1314 02/02/22 0121 02/03/22 0600 02/04/22 0542  AST 114* 90* 56* 27 23  ALT 61* 56* 44 33 31  ALKPHOS 273* 300* 237* 205* 255*  BILITOT 1.0 0.7 0.9 0.9 1.0  PROT 5.4* 5.6* 5.3* 5.2* 5.7*  ALBUMIN 2.4* 2.3* 2.2* 2.1* 2.3*    CBG: Recent Labs  Lab 02/04/22 1546 02/04/22 1653 02/05/22 0019 02/05/22 0611 02/05/22 1136  GLUCAP 345* 321* 413* 281* 283*     Recent Results (from the past 240 hour(s))  Blood culture (routine x 2)     Status: Abnormal   Collection Time: 02/01/22  5:00 AM   Specimen: BLOOD RIGHT HAND  Result Value Ref Range  Status   Specimen Description BLOOD RIGHT HAND  Final   Special Requests   Final    BOTTLES DRAWN AEROBIC AND ANAEROBIC Blood Culture results may not be optimal due to an inadequate volume of blood received in culture bottles   Culture  Setup Time   Final    GRAM POSITIVE COCCI AEROBIC BOTTLE ONLY CRITICAL VALUE NOTED.  VALUE IS CONSISTENT WITH PREVIOUSLY REPORTED AND CALLED VALUE.    Culture (A)  Final    ENTEROCOCCUS FAECIUM SUSCEPTIBILITIES PERFORMED ON PREVIOUS CULTURE WITHIN THE LAST 5 DAYS. Performed at [facility], [place] [address] [place], [place] [address]    Report Status 02/04/2022 FINAL  Final  Blood culture (routine x 2)     Status: Abnormal   Collection Time: 02/01/22  5:00 AM   Specimen: BLOOD RIGHT ARM  Result Value Ref Range Status   Specimen Description BLOOD RIGHT ARM  Final   Special Requests   Final    BOTTLES DRAWN AEROBIC AND ANAEROBIC Blood Culture results may not be optimal due to an inadequate volume of blood received in culture bottles   Culture  Setup Time   Final    GRAM POSITIVE COCCI IN CHAINS IN BOTH AEROBIC AND ANAEROBIC BOTTLES CRITICAL RESULT CALLED TO, READ BACK BY AND VERIFIED WITH:  C/ PHARMD S. WATSON 02/01/22 0924 A. LAFRANCE  Performed at [facility], [place] [address] [place], [place] [address]    Culture ENTEROCOCCUS FAECIUM ENTEROCOCCUS FAECALIS  (A)  Final   Report Status 02/04/2022 FINAL  Final   Organism ID, Bacteria ENTEROCOCCUS FAECIUM  Final   Organism ID, Bacteria ENTEROCOCCUS FAECALIS  Final      Susceptibility   Enterococcus faecalis - MIC*    AMPICILLIN <=2 SENSITIVE Sensitive     VANCOMYCIN 1 SENSITIVE Sensitive     GENTAMICIN SYNERGY SENSITIVE Sensitive     * ENTEROCOCCUS FAECALIS   Enterococcus faecium - MIC*    AMPICILLIN >=32 RESISTANT Resistant     VANCOMYCIN <=0.5 SENSITIVE Sensitive     GENTAMICIN SYNERGY SENSITIVE Sensitive     * ENTEROCOCCUS FAECIUM  Blood Culture ID Panel (Reflexed)     Status:  Abnormal   Collection Time: 02/01/22  5:00 AM  Result Value Ref Range Status   Enterococcus faecalis DETECTED (A) NOT DETECTED Corrected    Comment: CRITICAL RESULT CALLED TO, READ BACK BY AND VERIFIED WITH:  C/ PHARMD S. WATSON 02/01/22 2124 A. LAFRANCE  CORRECTED ON 09/13 AT 0129: PREVIOUSLY REPORTED AS DETECTED CRITICAL RESULT CALLED TO, READ BACK BY AND VERIFIED WITH:  C/ PHARMD S. WATSON 02/01/22 0924 A. LAFRANCE     Enterococcus Faecium  DETECTED (A) NOT DETECTED Corrected    Comment: CRITICAL RESULT CALLED TO, READ BACK BY AND VERIFIED WITH:  C/ PHARMD S. WATSON 02/01/22 2124 A. LAFRANCE  CORRECTED ON 09/13 AT 0129: PREVIOUSLY REPORTED AS DETECTED CRITICAL RESULT CALLED TO, READ BACK BY AND VERIFIED WITH:  C/ PHARMD S. WATSON 02/01/22 0924 A. LAFRANCE     Listeria monocytogenes NOT DETECTED NOT DETECTED Final   Staphylococcus species NOT DETECTED NOT DETECTED Final   Staphylococcus aureus (BCID) NOT DETECTED NOT DETECTED Final   Staphylococcus epidermidis NOT DETECTED NOT DETECTED Final   Staphylococcus lugdunensis NOT DETECTED NOT DETECTED Final   Streptococcus species NOT DETECTED NOT DETECTED Final   Streptococcus agalactiae NOT DETECTED NOT DETECTED Final   Streptococcus pneumoniae NOT DETECTED NOT DETECTED Final   Streptococcus pyogenes NOT DETECTED NOT DETECTED Final   A.calcoaceticus-baumannii NOT DETECTED NOT DETECTED Final   Bacteroides fragilis NOT DETECTED NOT DETECTED Final   Enterobacterales NOT DETECTED NOT DETECTED Final   Enterobacter cloacae complex NOT DETECTED NOT DETECTED Final   Escherichia coli NOT DETECTED NOT DETECTED Final   Klebsiella aerogenes NOT DETECTED NOT DETECTED Final   Klebsiella oxytoca NOT DETECTED NOT DETECTED Final   Klebsiella pneumoniae NOT DETECTED NOT DETECTED Final   Proteus species NOT DETECTED NOT DETECTED Final   Salmonella species NOT DETECTED NOT DETECTED Final   Serratia marcescens NOT DETECTED NOT DETECTED Final   Haemophilus  influenzae NOT DETECTED NOT DETECTED Final   Neisseria meningitidis NOT DETECTED NOT DETECTED Final   Pseudomonas aeruginosa NOT DETECTED NOT DETECTED Final   Stenotrophomonas maltophilia NOT DETECTED NOT DETECTED Final   Candida albicans NOT DETECTED NOT DETECTED Final   Candida auris NOT DETECTED NOT DETECTED Final   Candida glabrata NOT DETECTED NOT DETECTED Final   Candida krusei NOT DETECTED NOT DETECTED Final   Candida parapsilosis NOT DETECTED NOT DETECTED Final   Candida tropicalis NOT DETECTED NOT DETECTED Final   Cryptococcus neoformans/gattii NOT DETECTED NOT DETECTED Final   Vancomycin resistance NOT DETECTED NOT DETECTED Final    Comment: Performed at [facility], [address], [place], [place] [address]  Culture, blood (Routine X 2) w Reflex to ID Panel     Status: None (Preliminary result)   Collection Time: 02/03/22  9:39 AM   Specimen: BLOOD RIGHT WRIST  Result Value Ref Range Status   Specimen Description BLOOD RIGHT WRIST  Final   Special Requests   Final    BOTTLES DRAWN AEROBIC AND ANAEROBIC Blood Culture adequate volume   Culture   Final    NO GROWTH 2 DAYS Performed at [facility], [address], [place], [place] [address]    Report Status PENDING  Incomplete  Culture, blood (Routine X 2) w Reflex to ID Panel     Status: None (Preliminary result)   Collection Time: 02/03/22  9:45 AM   Specimen: BLOOD RIGHT FOREARM  Result Value Ref Range Status   Specimen Description BLOOD RIGHT FOREARM  Final   Special Requests   Final    BOTTLES DRAWN AEROBIC AND ANAEROBIC Blood Culture adequate volume   Culture   Final    NO GROWTH 2 DAYS Performed at [facility], [name] [address] [place], [place] [address]    Report Status PENDING  Incomplete         Radiology Studies: ECHO TEE  Result Date: 02/04/2022    TRANSESOPHOGEAL ECHO REPORT   Patient Name:   Susan [name] Date of Exam:  02/04/2022 Medical Rec #:  [id number]    Height:       66.0 in  Accession #:    [id number]   Weight:       238.8 lb Date of Birth:  07/02/1950    BSA:          2.156 m Patient Age:    71 years     BP:           127/55 mmHg Patient Gender: F            HR:           69 bpm. Exam Location:  Inpatient Procedure: Transesophageal Echo, Color Doppler and Cardiac Doppler Indications:     Bacteremia  History:         Patient has prior history of Echocardiogram examinations, most                  recent 02/02/2022. CHF, Arrythmias:Atrial Fibrillation; Risk                  Factors:Hypertension, Diabetes and Dyslipidemia.  Sonographer:     Raquel Sarna Senior RDCS Referring Phys:  [id number] Burna [name] [facility] Diagnosing Phys: Glori Bickers MD PROCEDURE: After discussion of the risks and benefits of a TEE, an informed consent was obtained from the patient. The transesophogeal probe was passed without difficulty through the esophogus of the patient. Sedation performed by different physician. The patient was monitored while under deep sedation. Anesthestetic sedation was provided intravenously by Anesthesiology: '247mg'$  of Propofol, '100mg'$  of Lidocaine. The patient developed no complications during the procedure. IMPRESSIONS  1. Left ventricular ejection fraction, by estimation, is 30%%. The left ventricle has moderately decreased function.  2. Right ventricular systolic function is moderately reduced. The right ventricular size is moderately enlarged.  3. Left atrial size was moderately dilated. No left atrial/left atrial appendage thrombus was detected.  4. Right atrial size was severely dilated.  5. No vegetation. The mitral valve is normal in structure. Trivial mitral valve regurgitation.  6. No vegetation. Tricuspid valve regurgitation is severe.  7. 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 No vegetation. The aortic valve is tricuspid. There is mild calcification of the aortic valve. Aortic valve regurgitation is not visualized.  8. No  vegetation.  9. There is Moderate (Grade III) plaque involving the descending aorta. FINDINGS  Left Ventricle: Left ventricular ejection fraction, by estimation, is 30%%. The left ventricle has moderately decreased function. The left ventricular internal cavity size was normal in size. Right Ventricle: The right ventricular size is moderately enlarged. No increase in right ventricular wall thickness. Right ventricular systolic function is moderately reduced. Left Atrium: Left atrial size was moderately dilated. No left atrial/left atrial appendage thrombus was detected. Right Atrium: Right atrial size was severely dilated. Pericardium: There is no evidence of pericardial effusion. Mitral Valve: No vegetation. The mitral valve is normal in structure. Trivial mitral valve regurgitation. Tricuspid Valve: No vegetation. The tricuspid valve is normal in structure. Tricuspid valve regurgitation is severe. Aortic Valve: There is a small filamentous structure on the aortic side of the valve that is likely a Lambl's excresence. No vegetation. The aortic valve is tricuspid. There is mild calcification of the aortic valve. Aortic valve regurgitation is not visualized. Pulmonic Valve: No vegetation. The pulmonic valve was normal in structure. Pulmonic valve regurgitation is trivial. Aorta: The aortic root is normal in size and structure. There is moderate (  Grade III) plaque involving the descending aorta. IAS/Shunts: No atrial level shunt detected by color flow Doppler. Glori Bickers MD Electronically signed by Glori Bickers MD Signature Date/Time: 02/04/2022/6:15:13 PM    Final         Scheduled Meds:  amiodarone  200 mg Oral Daily   apixaban  5 mg Oral BID   carvedilol  6.25 mg Oral BID WC   insulin aspart  0-9 Units Subcutaneous Q6H   insulin detemir  15 Units Subcutaneous BID   levothyroxine  50 mcg Oral QAC breakfast   senna-docusate  1 tablet Oral BID   sodium chloride flush  3 mL Intravenous Q12H    Continuous Infusions:  sodium chloride     sodium chloride 10 mL/hr at 02/05/22 0616   cefTRIAXone (ROCEPHIN)  IV     [START ON 02/06/2022] DAPTOmycin (CUBICIN) 800 mg in sodium chloride 0.9 % IVPB     vancomycin Stopped (02/04/22 1526)     LOS: 4 days    Time spent: 40 minutes    Irine Seal, MD [facility]   To contact the attending provider between 7A-7P or the covering provider during after hours 7P-7A, please log into the web site [email or web address] and access using universal [place] password for that web site. If you do not have the password, please call the hospital operator.  02/05/2022, 6:51 PM

## 2022-02-05 NOTE — Progress Notes (Signed)
1 Day Post-Op  Subjective: Feeling well this morning.  RHC planned for today initially.  Ate liquids yesterday with no issues.    ROS: See above, otherwise other systems negative  Objective: Vital signs in last 24 hours: Temp:  [97 F (36.1 C)-98 F (36.7 C)] 97.6 F (36.4 C) (09/13 0738) Pulse Rate:  [64-78] 65 (09/13 0738) Resp:  [11-19] 13 (09/13 0738) BP: (103-132)/(42-69) 117/59 (09/13 0738) SpO2:  [95 %-100 %] 100 % (09/13 0738) Weight:  [108.9 kg] 108.9 kg (09/13 0400) Last BM Date : 02/02/22  Intake/Output from previous day: 09/12 0701 - 09/13 0700 In: 250.9 [I.V.:100.9; IV Piggyback:150] Out: 1400 [Urine:1400] Intake/Output this shift: No intake/output data recorded.  PE: Gen: NAD Abd: soft, minimal slightly lower epigastric discomfort, minimal RUQ discomfort, no guarding or rebounding  Lab Results:  Recent Labs    02/04/22 0542 02/05/22 0600  WBC 3.7* 5.0  HGB 9.6* 9.1*  HCT 30.3* 29.0*  PLT 150 138*   BMET Recent Labs    02/04/22 0542 02/05/22 0600  NA 130* 131*  K 5.1 5.0  CL 94* 99  CO2 23 22  GLUCOSE 391* 299*  BUN 84* 84*  CREATININE 3.44* 3.19*  CALCIUM 8.4* 8.4*   PT/INR No results for input(s): "LABPROT", "INR" in the last 72 hours. CMP     Component Value Date/Time   NA 131 (L) 02/05/2022 0600   K 5.0 02/05/2022 0600   CL 99 02/05/2022 0600   CO2 22 02/05/2022 0600   GLUCOSE 299 (H) 02/05/2022 0600   GLUCOSE >444 (H) 06/07/2015 1311   BUN 84 (H) 02/05/2022 0600   CREATININE 3.19 (H) 02/05/2022 0600   CREATININE 3.08 (H) 08/05/2021 1648   CALCIUM 8.4 (L) 02/05/2022 0600   PROT 5.7 (L) 02/04/2022 0542   ALBUMIN 2.3 (L) 02/04/2022 0542   AST 23 02/04/2022 0542   ALT 31 02/04/2022 0542   ALKPHOS 255 (H) 02/04/2022 0542   BILITOT 1.0 02/04/2022 0542   GFRNONAA 15 (L) 02/05/2022 0600   GFRNONAA 11 (L) 11/15/2020 1452   GFRAA 13 (L) 11/15/2020 1452   Lipase     Component Value Date/Time   LIPASE 60 (H) 02/02/2022 0121        Studies/Results: ECHO TEE  Result Date: 02/04/2022    TRANSESOPHOGEAL ECHO REPORT   Patient Name:   Susan [name] Date of Exam: 02/04/2022 Medical Rec #:  [id number]    Height:       66.0 in Accession #:    [id number]   Weight:       238.8 lb Date of Birth:  05/06/1951    BSA:          2.156 m Patient Age:    71 years     BP:           127/55 mmHg Patient Gender: F            HR:           69 bpm. Exam Location:  Inpatient Procedure: Transesophageal Echo, Color Doppler and Cardiac Doppler Indications:     Bacteremia  History:         Patient has prior history of Echocardiogram examinations, most                  recent 02/02/2022. CHF, Arrythmias:Atrial Fibrillation; Risk                  Factors:Hypertension, Diabetes and Dyslipidemia.  Sonographer:  [facility] Senior RDCS Referring Phys:  [id number] [facility] Diagnosing Phys: Glori Bickers MD PROCEDURE: After discussion of the risks and benefits of a TEE, an informed consent was obtained from the patient. The transesophogeal probe was passed without difficulty through the esophogus of the patient. Sedation performed by different physician. The patient was monitored while under deep sedation. Anesthestetic sedation was provided intravenously by Anesthesiology: '247mg'$  of Propofol, '100mg'$  of Lidocaine. The patient developed no complications during the procedure. IMPRESSIONS  1. Left ventricular ejection fraction, by estimation, is 30%%. The left ventricle has moderately decreased function.  2. Right ventricular systolic function is moderately reduced. The right ventricular size is moderately enlarged.  3. Left atrial size was moderately dilated. No left atrial/left atrial appendage thrombus was detected.  4. Right atrial size was severely dilated.  5. No vegetation. The mitral valve is normal in structure. Trivial mitral valve regurgitation.  6. No vegetation. Tricuspid valve regurgitation is severe.  7. There is a small filamentous structure on the aortic  side of the valve that is likely a Lambl's excresence. No vegetation. The aortic valve is tricuspid. There is mild calcification of the aortic valve. Aortic valve regurgitation is not visualized.  8. No vegetation.  9. There is Moderate (Grade III) plaque involving the descending aorta. FINDINGS  Left Ventricle: Left ventricular ejection fraction, by estimation, is 30%%. The left ventricle has moderately decreased function. The left ventricular internal cavity size was normal in size. Right Ventricle: The right ventricular size is moderately enlarged. No increase in right ventricular wall thickness. Right ventricular systolic function is moderately reduced. Left Atrium: Left atrial size was moderately dilated. No left atrial/left atrial appendage thrombus was detected. Right Atrium: Right atrial size was severely dilated. Pericardium: There is no evidence of pericardial effusion. Mitral Valve: No vegetation. The mitral valve is normal in structure. Trivial mitral valve regurgitation. Tricuspid Valve: No vegetation. The tricuspid valve is normal in structure. Tricuspid valve regurgitation is severe. Aortic Valve: There is a small filamentous structure on the aortic side of the valve that is likely a Lambl's excresence. No vegetation. The aortic valve is tricuspid. There is mild calcification of the aortic valve. Aortic valve regurgitation is not visualized. Pulmonic Valve: No vegetation. The pulmonic valve was normal in structure. Pulmonic valve regurgitation is trivial. Aorta: The aortic root is normal in size and structure. There is moderate (Grade III) plaque involving the descending aorta. IAS/Shunts: No atrial level shunt detected by color flow Doppler. Glori Bickers MD Electronically signed by Glori Bickers MD Signature Date/Time: 02/04/2022/6:15:13 PM    Final    DG ERCP  Result Date: 02/03/2022 CLINICAL DATA:  ERCP for choledocholithiasis. EXAM: ERCP TECHNIQUE: Multiple spot images obtained with the  fluoroscopic device and submitted for interpretation post-procedure. FLUOROSCOPY TIME: FLUOROSCOPY TIME 4 minutes, 15 seconds (27 mGy) COMPARISON:  MRCP-02/01/2022 FINDINGS: Four spot intraoperative fluoroscopic images the right upper abdominal quadrant during ERCP are provided for review Initial image demonstrates an ERCP probe overlying the right upper abdominal quadrant. Subsequent images demonstrate selective cannulation and opacification of the common bile duct which appears moderately dilated. There is faint opacification of the intrahepatic biliary tree which appears nondilated. There is no definitive opacification of the pancreatic duct. There are several filling defects within the gallbladder compatible with known cholelithiasis. IMPRESSION: 1. ERCP as above. 2. Cholelithiasis as demonstrated on preceding MRCP. These images were submitted for radiologic interpretation only. Please see the procedural report for the amount of contrast and the fluoroscopy time utilized. Electronically  Signed   By: Sandi Mariscal M.D.   On: 02/03/2022 15:21    Anti-infectives: Anti-infectives (From admission, onward)    Start     Dose/Rate Route Frequency Ordered Stop   02/04/22 1430  vancomycin (VANCOREADY) IVPB 750 mg/150 mL        750 mg 150 mL/hr over 60 Minutes Intravenous Every 48 hours 02/02/22 1438     02/02/22 1530  vancomycin (VANCOCIN) 2,500 mg in sodium chloride 0.9 % 500 mL IVPB        2,500 mg 262.5 mL/hr over 120 Minutes Intravenous  Once 02/02/22 1438 02/02/22 1756   02/01/22 2230  ampicillin (OMNIPEN) 2 g in sodium chloride 0.9 % 100 mL IVPB  Status:  Discontinued        2 g 300 mL/hr over 20 Minutes Intravenous Every 12 hours 02/01/22 2139 02/02/22 1438        Assessment/Plan Gallstone pancreatitis, choledocholithiasis, Enterococcus bacteremia, H/O biliary colic -appreciate cardiology evaluation.  Agree with patient's risk stratification -given the patient does not have an acute infection  of her gallbladder, our hands are not forced to consider a lap chole.  She has had an ERCP with ductal clearing and sphincterotomy which gives her a higher percent chance that if she passes another stone it will pass and not get stuck causing cholangitis or pancreatitis.   -because of this, we will hold off on surgical intervention at this time.  This has been discussed at length with the patient.  I have also discussed her case with Dr. Haroldine Laws as well who agrees that holding on surgery and seeing how she does is likely the best course of action from a cardiorenal standpoint. -patient understands all of this and agrees. -she does still have intermittent biliary colic for which she may continue to have but right now risk outweigh benefit.  If her symptoms continue to persist and she gets to the point where she is willing to risk surgery, then this is something that we can discuss on an elective basis.  She is already on ursodiol, so not much else to offer for her gb at this time, except low fat diet -RHC no longer needed today so this will be cancelled per cards and we will put her on a HH diet and see how she does. -abx per primary for bacteremia, likely secondary to cholangitis.  WBC normal and no evidence of cholecystitis on her imaging.   FEN - HH VTE - eliquis on hold, ok for chemical prophylaxis from our standpoint, may resume eliquis from our standpoint ID - Vanc   Uncontrolled DM CHF with EF of 30% Enterococcus bacteremia HLD Anxiety/depression Stage IV CKD CAD, H/O multiple MIs with stents HTN H/O CVA Hypothyroidism  I reviewed Consultant cardiology notes, hospitalist notes, last 24 h vitals and pain scores, last 48 h intake and output, last 24 h labs and trends, and last 24 h imaging results.   LOS: 4 days    Henreitta Cea , [facility] Surgery 02/05/2022, 8:39 AM Please see Amion for pager number during day hours 7:00am-4:30pm or 7:00am -11:30am on weekends

## 2022-02-05 NOTE — Progress Notes (Signed)
Patient was talking to me about her insulin regimen at home today. She is concerned that her husband may not know what he is doing when administering her insulin.  When husband came in to visit, I met with both of them to ask questions and decipher if this might really be. Husband seems to have a very good understanding of following the scale.  However, both mention that she has pens as well as another device that can deliver insulin, both at home. Prior to being admitted patient was using a device that delivers insulin through the arm which patient has to load with insulin.   The one thing that may be confusing at home is with multiple devices there are also multiple scales. The patient and husband express how confusing this is and ask a lot of questions about which is the right one.  Patient has a diabetic education consult in. I encourage them to ask questions, and explained what the plan of care will be going forward and discharge instructions will be written out at discharge. Continue to educate

## 2022-02-05 NOTE — Plan of Care (Signed)
  Problem: Education: Goal: Knowledge of General Education information will improve Description: Including pain rating scale, medication(s)/side effects and non-pharmacologic comfort measures Outcome: Progressing   Problem: Health Behavior/Discharge Planning: Goal: Ability to manage health-related needs will improve Outcome: Progressing   Problem: Clinical Measurements: Goal: Ability to maintain clinical measurements within normal limits will improve Outcome: Progressing Goal: Will remain free from infection Outcome: Progressing Goal: Diagnostic test results will improve Outcome: Progressing   Problem: Elimination: Goal: Will not experience complications related to bowel motility Outcome: Progressing   Problem: Skin Integrity: Goal: Risk for impaired skin integrity will decrease Outcome: Progressing

## 2022-02-05 NOTE — Inpatient Diabetes Management (Signed)
Inpatient Diabetes Program Recommendations  AACE/ADA: New Consensus Statement on Inpatient Glycemic Control (2015)  Target Ranges:  Prepandial:   less than 140 mg/dL      Peak postprandial:   less than 180 mg/dL (1-2 hours)      Critically ill patients:  140 - 180 mg/dL   Lab Results  Component Value Date   GLUCAP 283 (H) 02/05/2022   HGBA1C >15.5 (H) 01/20/2022    Review of Glycemic Control  Latest Reference Range & Units 02/04/22 11:01 02/04/22 12:22 02/04/22 13:44 02/04/22 15:46 02/04/22 16:53 02/05/22 00:19 02/05/22 06:11 02/05/22 11:36  Glucose-Capillary 70 - 99 mg/dL 296 (H) 317 (H) 309 (H) 345 (H) 321 (H) 413 (H) 281 (H) 283 (H)   Diabetes history: DM1 (requires basal, meal coverage, + correction) Per Lovena Le from Dr. Almetta Lovely office, patients insulin pump settings are:  MN-5A- 1.05 units/hr  5A-9A- 1.15 units/hr  9A-3P- 1.8 units/hr  3P-MN- 1 unit/hr  Total 24 hour basal is 29.65 units/24 hours-  Patient uses Regular insulin in insulin pump and has a SSI that she uses if blood sugars are really high and also has NPH 15 units prn if blood sugar >300 mg/dL. Current orders for Inpatient glycemic control: Levemir 15 units BID, Novolog 0-9 units Q6H  Inpatient Diabetes Program Recommendations:   -Change diet to include carb mod with sugar free drinks -Add Novolog 2-6 units tid meal coverage if eats 50% -Change Novolog correction to 0-9 units tid, 0-5 units hs  Thank you, Bethena Roys E. Russell Quinney, RN, MSN, CDE  Diabetes Coordinator Inpatient Glycemic Control Team Team Pager [phone] (8am-5pm) 02/05/2022 12:50 PM

## 2022-02-05 NOTE — Progress Notes (Signed)
PHARMACY CONSULT NOTE FOR:  OUTPATIENT  PARENTERAL ANTIBIOTIC THERAPY (OPAT)  Indication: E faecalis/E faecium BSI  Regimen: Daptomycin 800 mg IV Q48H  End date: 02/16/2022  IV antibiotic discharge orders are pended. To discharging provider:  please sign these orders via discharge navigator,  Select New Orders & click on the button choice - Manage This Unsigned Work.    Thank you for allowing pharmacy to be a part of this patient's care.  Adria Dill, PharmD PGY-2 Infectious Diseases Resident  02/05/2022 11:09 AM

## 2022-02-05 NOTE — Progress Notes (Signed)
[place] ANTIBIOTIC CONSULT NOTE   Susan [name] a 71 y.o. female admitted on 01/31/22 with epigastric pain and concern for acute pancreatitis. Found to have mixed e faecalis and e faecium bacteremia in 2/4 bottles, 2/2 blood culture sets. Pharmacy has been consulted for vancomycin dosing.  Scr 3.19 (CrCl 19 mL/min). WBC 5, afebrile. TEE yesterday negative for vegetations. No plans for surgery. Repeat blood cx negative.  Estimated Creatinine Clearance: 19.9 mL/min (A) (by C-G formula based on SCr of 3.19 mg/dL (H)).  Plan: Continue Vancomycin 750 mg IV Q48H  Monitor renal function, clinical status, de-escalation, C/S, levels as indicated  F/u ID recs  Allergies:  Allergies  Allergen Reactions   Keflex [Cephalexin] Diarrhea   Codeine Nausea And Vomiting and Other (See Comments)    Filed Weights   02/03/22 1254 02/04/22 0346 02/05/22 0400  Weight: 107.9 kg (237 lb 14 oz) 108.3 kg (238 lb 12.1 oz) 108.9 kg (240 lb 1.3 oz)       Latest Ref Rng & Units 02/05/2022    6:00 AM 02/04/2022    5:42 AM 02/03/2022    6:00 AM  CBC  WBC 4.0 - 10.5 K/uL 5.0  3.7  6.1   Hemoglobin 12.0 - 15.0 g/dL 9.1  9.6  9.1   Hematocrit 36.0 - 46.0 % 29.0  30.3  28.1   Platelets 150 - 400 K/uL 138  150  158     Antibiotics Given (last 72 hours)     Date/Time Action Medication Dose Rate   02/02/22 1123 New Bag/Given   ampicillin (OMNIPEN) 2 g in sodium chloride 0.9 % 100 mL IVPB 2 g 300 mL/hr   02/02/22 1556 New Bag/Given   vancomycin (VANCOCIN) 2,500 mg in sodium chloride 0.9 % 500 mL IVPB 2,500 mg 262.5 mL/hr   02/04/22 1426 New Bag/Given   vancomycin (VANCOREADY) IVPB 750 mg/150 mL 750 mg 150 mL/hr       Antimicrobials this admission: Amp 9/9>>9/10 Vancomycin 9/10>>  Microbiology results: 9/8 Bcx 2/4 +  E faecalis/E Faecium no resistance 9/11 Bcx - ngtd   Thank you for allowing pharmacy to be a part of this patient's care.  Antonietta Jewel, PharmD, [place] Clinical Pharmacist  Phone:  [phone] 02/05/2022 9:35 AM  Please check AMION for all [place] phone numbers After 10:00 PM, call [place] [phone]

## 2022-02-05 NOTE — Plan of Care (Signed)
  Problem: Coping: Goal: Ability to adjust to condition or change in health will improve Outcome: Progressing   Problem: Fluid Volume: Goal: Ability to maintain a balanced intake and output will improve Outcome: Progressing   Problem: Nutritional: Goal: Maintenance of adequate nutrition will improve Outcome: Progressing   Problem: Skin Integrity: Goal: Risk for impaired skin integrity will decrease Outcome: Progressing   Problem: Tissue Perfusion: Goal: Adequacy of tissue perfusion will improve Outcome: Progressing

## 2022-02-05 NOTE — Progress Notes (Signed)
Susan [name] for Infectious Disease  Date of Admission:  01/31/2022   Total days of inpatient antibiotics 2  Principal Problem:   Acute pancreatitis Active Problems:   Hyperlipemia   Chronic HFrEF (heart failure with reduced ejection fraction) (HCC)   CKD (chronic kidney disease), stage IV (HCC)   Nausea & vomiting   Hypothyroidism   Type 2 diabetes mellitus with diabetic polyneuropathy, with long-term current use of insulin (HCC)   Abdominal pain   SIRS (systemic inflammatory response syndrome) (HCC)   Transaminitis   History of anemia due to chronic kidney disease   Paroxysmal atrial fibrillation ([place])   Bacteremia          Assessment: [name] admitted with:  #Enterococcus faecalis and E faecium bacteremia suspect 2/2 GI source #Choledocholithiasis with mild pancreatitis #Dm-poorly controlled(A1C >15.5 on 8/28) #Diarrhea-resolved #Onychomycosis #CKD -TTE showed more thickened and calcified aortic valve compared to prior study. 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 - Abdominal ultrasound showed gallstones with prominent CBD no CBD stones visualized. -GI was consulted.  MRCP showed choledocholithiasis with mild pancreatitis(2 mm stone in the dependent portion of distal common bile duct and gentle tapering of the duct at level of the ampulla).  ERCP on 9/11 showed choledocholithiasis.  Patient underwent biliary sphincterectomy and balloon extraction. Still has some biliary colic. General surgery following no plans for lap chole a this point as pt not though to have an acute infection. Per cards pt has  surgical risk for cardiac complication and CKD progressing to ESRD may require HD.   Recommendations: -D/C vancomycin -Start daptomycin -Follow repeat blood Cx, if negative x72h place(tomorrow) will order PICC to complete 2 weeks of antibiotics from negative Cx EOT 9/24   Microbiology:    Antibiotics: 9/9 Ampicillin  Vancomycin 9/10-p Cultures: Blood 9/8 NG    SUBJECTIVE: Resting in bed. Able to eat breakfast this morning.  Interval: afebrile  overnight Review of Systems: Review of Systems  All other systems reviewed and are negative.    Scheduled Meds:  amiodarone  200 mg Oral Daily   apixaban  5 mg Oral BID   carvedilol  6.25 mg Oral BID WC   insulin aspart  0-9 Units Subcutaneous Q6H   insulin detemir  15 Units Subcutaneous BID   levothyroxine  50 mcg Oral QAC breakfast   sodium chloride flush  3 mL Intravenous Q12H   Continuous Infusions:  sodium chloride     sodium chloride 10 mL/hr at 02/05/22 0616   vancomycin Stopped (02/04/22 1526)   PRN Meds:.sodium chloride, acetaminophen **OR** acetaminophen, fentaNYL (SUBLIMAZE) injection, loperamide, LORazepam, naLOXone (NARCAN)  injection, mouth rinse, sodium chloride flush Allergies  Allergen Reactions   Keflex [Cephalexin] Diarrhea   Codeine Nausea And Vomiting and Other (See Comments)    OBJECTIVE: Vitals:   02/04/22 2345 02/05/22 0340 02/05/22 0400 02/05/22 0738  BP: 123/60 (!) 128/44  (!) 117/59  Pulse: 67 65  65  Resp: '12 13  13  '$ Temp: 97.6 F (36.4 C) 97.8 F (36.6 C)  97.6 F (36.4 C)  TempSrc: Oral Oral  Oral  SpO2: 95% 97%  100%  Weight:   108.9 kg   Height:       Body mass index is 38.75 kg/m.  Physical Exam Constitutional:      Appearance: Normal appearance.  HENT:     Head: Normocephalic and atraumatic.  Right Ear: Tympanic membrane normal.     Left Ear: Tympanic membrane normal.     Nose: Nose normal.     Mouth/Throat:     Mouth: Mucous membranes are moist.  Eyes:     Extraocular Movements: Extraocular movements intact.     Conjunctiva/sclera: Conjunctivae normal.     Pupils: Pupils are equal, round, and reactive to light.  Cardiovascular:     Rate and Rhythm: Normal rate and regular rhythm.     Heart sounds: No murmur heard.    No friction rub. No gallop.   Pulmonary:     Effort: Pulmonary effort is normal.     Breath sounds: Normal breath sounds.  Abdominal:     General: Abdomen is flat.     Palpations: Abdomen is soft.  Musculoskeletal:        General: Normal range of motion.  Skin:    General: Skin is warm and dry.  Neurological:     General: No focal deficit present.     Mental Status: She is alert and oriented to person, place, and time.  Psychiatric:        Mood and Affect: Mood normal.       Lab Results Lab Results  Component Value Date   WBC 5.0 02/05/2022   HGB 9.1 (L) 02/05/2022   HCT 29.0 (L) 02/05/2022   MCV 84.8 02/05/2022   PLT 138 (L) 02/05/2022    Lab Results  Component Value Date   CREATININE 3.19 (H) 02/05/2022   BUN 84 (H) 02/05/2022   NA 131 (L) 02/05/2022   K 5.0 02/05/2022   CL 99 02/05/2022   CO2 22 02/05/2022    Lab Results  Component Value Date   ALT 31 02/04/2022   AST 23 02/04/2022   GGT 144 (H) 02/01/2022   ALKPHOS 255 (H) 02/04/2022   BILITOT 1.0 02/04/2022        [name], [place] for Infectious Disease [facility] 02/05/2022, 10:41 AM

## 2022-02-05 NOTE — Progress Notes (Signed)
Mobility Specialist Progress Note    02/05/22 1046  Mobility  Activity Ambulated with assistance in room  Level of Assistance Minimal assist, patient does 75% or more  Assistive Device Front wheel walker  Distance Ambulated (ft) 34 ft (12+24)  Activity Response Tolerated well  $Mobility charge 1 Mobility   Pre-Mobility: 70 HR, 100% SpO2 Post-Mobility: 74 HR, 100% SpO2  Pt received in bed and agreeable. No complaints. Left in chair with call bell in reach.    Hildred Alamin Mobility Specialist

## 2022-02-05 NOTE — Progress Notes (Addendum)
Advanced Heart Failure Rounding Note  PCP-Cardiologist: Glori Bickers, MD   Subjective:    Remains on abx. AF. WBC now normalized.   D/w GS. No plans for surgery. Continue conservative tx. Abd pain improving. Tolerated clear liquid diet. GS advancing to PO today.   Feels ok from cardiac standpoint, no dyspnea. No CP   TEE 9/12 LVEF 30%, gobal HK, RV mod HK. No TEE evidence of endocarditis   Objective:   Weight Range: 108.9 kg Body mass index is 38.75 kg/m.   Vital Signs:   Temp:  [97 F (36.1 C)-98 F (36.7 C)] 97.6 F (36.4 C) (09/13 0738) Pulse Rate:  [64-78] 65 (09/13 0738) Resp:  [11-19] 13 (09/13 0738) BP: (103-132)/(42-69) 117/59 (09/13 0738) SpO2:  [95 %-100 %] 100 % (09/13 0738) Weight:  [108.9 kg] 108.9 kg (09/13 0400) Last BM Date : 02/02/22  Weight change: Filed Weights   02/03/22 1254 02/04/22 0346 02/05/22 0400  Weight: 107.9 kg 108.3 kg 108.9 kg    Intake/Output:   Intake/Output Summary (Last 24 hours) at 02/05/2022 0836 Last data filed at 02/05/2022 0622 Gross per 24 hour  Intake 250.94 ml  Output 1400 ml  Net -1149.06 ml      Physical Exam    General:  Well appearing. No resp difficulty HEENT: Normal Neck: Supple. JVP not elevated . Carotids 2+ bilat; no bruits. No lymphadenopathy or thyromegaly appreciated. Cor: PMI nondisplaced. Regular rate & rhythm. No rubs, gallops or murmurs. Lungs: Clear Abdomen: Soft, nontender, nondistended. No hepatosplenomegaly. No bruits or masses. Good bowel sounds. Extremities: No cyanosis, clubbing, rash, edema Neuro: Alert & orientedx3, cranial nerves grossly intact. moves all 4 extremities w/o difficulty. Affect pleasant   Telemetry   NSR 60s   EKG    N/A   Labs    CBC Recent Labs    02/04/22 0542 02/05/22 0600  WBC 3.7* 5.0  HGB 9.6* 9.1*  HCT 30.3* 29.0*  MCV 84.2 84.8  PLT 150 704*   Basic Metabolic Panel Recent Labs    02/04/22 0542 02/05/22 0600  NA 130* 131*  K 5.1  5.0  CL 94* 99  CO2 23 22  GLUCOSE 391* 299*  BUN 84* 84*  CREATININE 3.44* 3.19*  CALCIUM 8.4* 8.4*   Liver Function Tests Recent Labs    02/03/22 0600 02/04/22 0542  AST 27 23  ALT 33 31  ALKPHOS 205* 255*  BILITOT 0.9 1.0  PROT 5.2* 5.7*  ALBUMIN 2.1* 2.3*   No results for input(s): "LIPASE", "AMYLASE" in the last 72 hours. Cardiac Enzymes No results for input(s): "CKTOTAL", "CKMB", "CKMBINDEX", "TROPONINI" in the last 72 hours.  BNP: BNP (last 3 results) Recent Labs    01/15/22 0132 01/29/22 1147 02/01/22 0615  BNP 1,929.8* 891.1* 451.5*    ProBNP (last 3 results) No results for input(s): "PROBNP" in the last 8760 hours.   D-Dimer No results for input(s): "DDIMER" in the last 72 hours. Hemoglobin A1C No results for input(s): "HGBA1C" in the last 72 hours. Fasting Lipid Panel No results for input(s): "CHOL", "HDL", "LDLCALC", "TRIG", "CHOLHDL", "LDLDIRECT" in the last 72 hours. Thyroid Function Tests No results for input(s): "TSH", "T4TOTAL", "T3FREE", "THYROIDAB" in the last 72 hours.  Invalid input(s): "FREET3"  Other results:   Imaging    ECHO TEE  Result Date: 02/04/2022    TRANSESOPHOGEAL ECHO REPORT   Patient Name:   Susan [name] Date of Exam: 02/04/2022 Medical Rec #:  [id number]    Height:  66.0 in Accession #:    [id number]   Weight:       238.8 lb Date of Birth:  [birth year]    BSA:          2.156 m Patient Age:    71 years     BP:           127/55 mmHg Patient Gender: F            HR:           69 bpm. Exam Location:  Inpatient Procedure: Transesophageal Echo, Color Doppler and Cardiac Doppler Indications:     Bacteremia  History:         Patient has prior history of Echocardiogram examinations, most                  recent 02/02/2022. CHF, Arrythmias:Atrial Fibrillation; Risk                  Factors:Hypertension, Diabetes and Dyslipidemia.  Sonographer:     Raquel Sarna Senior RDCS Referring Phys:  [id number] Burna [name] [facility] Diagnosing Phys: Glori Bickers MD PROCEDURE: After discussion of the risks and benefits of a TEE, an informed consent was obtained from the patient. The transesophogeal probe was passed without difficulty through the esophogus of the patient. Sedation performed by different physician. The patient was monitored while under deep sedation. Anesthestetic sedation was provided intravenously by Anesthesiology: '247mg'$  of Propofol, '100mg'$  of Lidocaine. The patient developed no complications during the procedure. IMPRESSIONS  1. Left ventricular ejection fraction, by estimation, is 30%%. The left ventricle has moderately decreased function.  2. Right ventricular systolic function is moderately reduced. The right ventricular size is moderately enlarged.  3. Left atrial size was moderately dilated. No left atrial/left atrial appendage thrombus was detected.  4. Right atrial size was severely dilated.  5. No vegetation. The mitral valve is normal in structure. Trivial mitral valve regurgitation.  6. No vegetation. Tricuspid valve regurgitation is severe.  7. 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 No vegetation. The aortic valve is tricuspid. There is mild calcification of the aortic valve. Aortic valve regurgitation is not visualized.  8. No vegetation.  9. There is Moderate (Grade III) plaque involving the descending aorta. FINDINGS  Left Ventricle: Left ventricular ejection fraction, by estimation, is 30%%. The left ventricle has moderately decreased function. The left ventricular internal cavity size was normal in size. Right Ventricle: The right ventricular size is moderately enlarged. No increase in right ventricular wall thickness. Right ventricular systolic function is moderately reduced. Left Atrium: Left atrial size was moderately dilated. No left atrial/left atrial appendage thrombus was detected. Right Atrium: Right atrial size was severely dilated. Pericardium: There is  no evidence of pericardial effusion. Mitral Valve: No vegetation. The mitral valve is normal in structure. Trivial mitral valve regurgitation. Tricuspid Valve: No vegetation. The tricuspid valve is normal in structure. Tricuspid valve regurgitation is severe. Aortic Valve: There is a small filamentous structure on the aortic side of the valve that is likely a Lambl's excresence. No vegetation. The aortic valve is tricuspid. There is mild calcification of the aortic valve. Aortic valve regurgitation is not visualized. Pulmonic Valve: No vegetation. The pulmonic valve was normal in structure. Pulmonic valve regurgitation is trivial. Aorta: The aortic root is normal in size and structure. There is moderate (Grade III) plaque involving the descending aorta. IAS/Shunts: No atrial level shunt detected by color flow  Doppler. Glori Bickers MD Electronically signed by Glori Bickers MD Signature Date/Time: 02/04/2022/6:15:13 PM    Final      Medications:     Scheduled Medications:  amiodarone  200 mg Oral Daily   carvedilol  6.25 mg Oral BID WC   insulin aspart  0-9 Units Subcutaneous Q6H   insulin detemir  12 Units Subcutaneous BID   levothyroxine  50 mcg Oral QAC breakfast   sodium chloride flush  3 mL Intravenous Q12H    Infusions:  sodium chloride     sodium chloride 10 mL/hr at 02/05/22 0616   vancomycin Stopped (02/04/22 1526)    PRN Medications: sodium chloride, acetaminophen **OR** acetaminophen, fentaNYL (SUBLIMAZE) injection, loperamide, LORazepam, naLOXone (NARCAN)  injection, mouth rinse, sodium chloride flush    Patient Profile   71 y/o with morbid obesity, 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 Admitted with gallstone pancreatitis and enterococcus bacteremia. Echo this admit w/ LVEF 30% RV moderately HK and severe TR. No vegetation. AHF team consulted for pre-op risk stratification.    Assessment/Plan    Gallstone pancreatitis - Lipase 1341>112>60 - s/p ERCP on 09/11 with biliary sphincterotomy and balloon extraction - No evidence of cholecystitis on imaging - General surgery consulted, considering lap chole - Her surgical risk for cardiac complications and progression of CKD to point of dialysis is high. Fortunately, she seems to be improving with current treatment. D/w GS. No plans for surgery. Continue conservative management    Enterococcus faecalis and E faecium bacteremia - Repeat BC X 2 09/11 NGTD - GI source suspected - ID following - On IV vancomycin - WBC 20K >> 5K. AF. - No evidence of endocarditis on TEE   Chronic biventricular CHF - Echo (9/21): EF 55-60%, RV normal  - Echo (10/22): EF down to 30-35% in setting of NSTEMI, Grade II DD. - Echo (12/10/21): EF 25-30%, RV mildly reduced. - Ltd Echo (8/23): EF 30-35%, RV moderately reduced - Echo this admit: EF 30%, interventricular septum flattened in diastole consistent with RV volume overload, RV low normal, severe TR - Volume looks okay on exam. Diuretics held on admit d/t infection.  - Continue Coreg 6.25 BID - GDMT has been limited by hx hypotension and CKD - Monitor volume status as we advance diet. Add back diuretics soon     Paroxysmal AF/AFL - New during 8/23 admit - Currently SR. Continue amiodarone 200 mg daily - Anticoagulation has been on hold pending possible surgery. On Eliquis 5 BID PTA. ? Resume today    CKD IV - Baseline Scr ~ 3.5 - Scr up to 4, improved to 3.2 today - BP initially soft. Now improving. Avoid hypotension.   Uncontrolled DM II - Hgb A1c 15 in 08/23 - No SGLT2i - Management per primary team   Anemia of CKD - Hgb stable in 9s - Monitor     Length of Stay: [address], PA-C  02/05/2022, 8:36 AM  Advanced Heart Failure Team Pager [phone] (M-F; 7a - 5p)  Please contact [facility] for night-coverage after hours (5p -7a ) and weekends on [email or web address]  Patient  seen and examined with the above-signed Advanced Practice Provider and/or Housestaff. I personally reviewed laboratory data, imaging studies and relevant notes. I independently examined the patient and formulated the important aspects of the plan. I have edited the note to reflect any of my changes or salient points. I have personally discussed the  plan with the patient and/or family.  Feeling better. Hemodynamically stable. Afebrile. Advancing diet.   GSU not planning for surgery   General:  Sitting up in bed  No resp difficulty HEENT: normal Neck: supple. no JVD. Carotids 2+ bilat; no bruits. No lymphadenopathy or thryomegaly appreciated. Cor: PMI nondisplaced. Regular rate & rhythm. 2/6 TR Lungs: clear Abdomen: obese soft, mildly tender, nondistended. No hepatosplenomegaly. No bruits or masses. Good bowel sounds. Extremities: no cyanosis, clubbing, rash, trace edema Neuro: alert & orientedx3, cranial nerves grossly intact. moves all 4 extremities w/o difficulty. Affect pleasant  Case d/w GSU. No plans for surgery at this time. If she fail medical management and has intolerable biliary colic can revisit. Stop IVF. Would hold diuretics one more day - restart tomorrow. Otherwise resume home meds as tolerated. We will cancel RHC. HF team will sign off. Please call with questions.  Glori Bickers, MD  9:56 AM

## 2022-02-05 NOTE — Progress Notes (Signed)
[place] ANTIBIOTIC CONSULT NOTE   Susan [name] a 71 y.o. female admitted on 01/31/22 with epigastric pain and concern for acute pancreatitis. Found to have mixed e faecalis and e faecium bacteremia in 2/4  bottles, 2/2 blood culture sets. Pharmacy has been consulted for daptomycin vancomycin dosing.  Scr 3.19 (CrCl 19 mL/min). WBC 5, afebrile. TEE yesterday negative for vegetations. No plans for surgery. Repeat blood cx negative.  Estimated Creatinine Clearance: 19.9 mL/min (A) (by C-G formula based on SCr of 3.19 mg/dL (H)).  Plan: STOP Vancomycin  START Daptomycin 800 mg IV Q48H (~10 mg/kg AdjBW)- first dose 9/14 2000 Monitor renal function- Frequency needs to be adjusted to Q24H with CrCl>30 mL/min  CK Qthurs  EOT 02/16/22- OPAT entered separately   Allergies:  Allergies  Allergen Reactions   Keflex [Cephalexin] Diarrhea   Codeine Nausea And Vomiting and Other (See Comments)    Filed Weights   02/03/22 1254 02/04/22 0346 02/05/22 0400  Weight: 107.9 kg (237 lb 14 oz) 108.3 kg (238 lb 12.1 oz) 108.9 kg (240 lb 1.3 oz)       Latest Ref Rng & Units 02/05/2022    6:00 AM 02/04/2022    5:42 AM 02/03/2022    6:00 AM  CBC  WBC 4.0 - 10.5 K/uL 5.0  3.7  6.1   Hemoglobin 12.0 - 15.0 g/dL 9.1  9.6  9.1   Hematocrit 36.0 - 46.0 % 29.0  30.3  28.1   Platelets 150 - 400 K/uL 138  150  158     Antibiotics Given (last 72 hours)     Date/Time Action Medication Dose Rate   02/02/22 1123 New Bag/Given   ampicillin (OMNIPEN) 2 g in sodium chloride 0.9 % 100 mL IVPB 2 g 300 mL/hr   02/02/22 1556 New Bag/Given   vancomycin (VANCOCIN) 2,500 mg in sodium chloride 0.9 % 500 mL IVPB 2,500 mg 262.5 mL/hr   02/04/22 1426 New Bag/Given   vancomycin (VANCOREADY) IVPB 750 mg/150 mL 750 mg 150 mL/hr       Antimicrobials this admission: Amp 9/9>>9/10 Vancomycin 9/10>>9/13 Daptomycin 9/13>>c  Microbiology results: 9/8 Bcx 2/4 +  E faecalis/E Faecium no resistance (Vanc-S) 9/11 Bcx - ngtd    Thank you for allowing pharmacy to be a part of this patient's care.  Adria Dill, PharmD PGY-2 Infectious Diseases Resident  02/05/2022 10:32 AM

## 2022-02-06 ENCOUNTER — Inpatient Hospital Stay: Payer: Self-pay

## 2022-02-06 ENCOUNTER — Encounter (HOSPITAL_COMMUNITY): Payer: Self-pay | Admitting: Internal Medicine

## 2022-02-06 DIAGNOSIS — N184 Chronic kidney disease, stage 4 (severe): Secondary | ICD-10-CM | POA: Diagnosis not present

## 2022-02-06 DIAGNOSIS — I5022 Chronic systolic (congestive) heart failure: Secondary | ICD-10-CM | POA: Diagnosis not present

## 2022-02-06 DIAGNOSIS — R7881 Bacteremia: Secondary | ICD-10-CM | POA: Diagnosis not present

## 2022-02-06 DIAGNOSIS — K851 Biliary acute pancreatitis without necrosis or infection: Secondary | ICD-10-CM | POA: Diagnosis not present

## 2022-02-06 LAB — GLUCOSE, CAPILLARY
Glucose-Capillary: 134 mg/dL — ABNORMAL HIGH (ref 70–99)
Glucose-Capillary: 183 mg/dL — ABNORMAL HIGH (ref 70–99)
Glucose-Capillary: 251 mg/dL — ABNORMAL HIGH (ref 70–99)
Glucose-Capillary: 251 mg/dL — ABNORMAL HIGH (ref 70–99)
Glucose-Capillary: 282 mg/dL — ABNORMAL HIGH (ref 70–99)
Glucose-Capillary: 308 mg/dL — ABNORMAL HIGH (ref 70–99)
Glucose-Capillary: 359 mg/dL — ABNORMAL HIGH (ref 70–99)
Glucose-Capillary: 374 mg/dL — ABNORMAL HIGH (ref 70–99)

## 2022-02-06 LAB — BASIC METABOLIC PANEL
Anion gap: 10 (ref 5–15)
BUN: 82 mg/dL — ABNORMAL HIGH (ref 8–23)
CO2: 26 mmol/L (ref 22–32)
Calcium: 8.3 mg/dL — ABNORMAL LOW (ref 8.9–10.3)
Chloride: 97 mmol/L — ABNORMAL LOW (ref 98–111)
Creatinine, Ser: 3.29 mg/dL — ABNORMAL HIGH (ref 0.44–1.00)
GFR, Estimated: 14 mL/min — ABNORMAL LOW (ref >60)
Glucose, Bld: 197 mg/dL — ABNORMAL HIGH (ref 70–99)
Potassium: 4.5 mmol/L (ref 3.5–5.1)
Sodium: 133 mmol/L — ABNORMAL LOW (ref 135–145)

## 2022-02-06 LAB — CBC
HCT: 27.8 % — ABNORMAL LOW (ref 36.0–46.0)
Hemoglobin: 8.9 g/dL — ABNORMAL LOW (ref 12.0–15.0)
MCH: 27.3 pg (ref 26.0–34.0)
MCHC: 32 g/dL (ref 30.0–36.0)
MCV: 85.3 fL (ref 80.0–100.0)
Platelets: 123 10*3/uL — ABNORMAL LOW (ref 150–400)
RBC: 3.26 MIL/uL — ABNORMAL LOW (ref 3.87–5.11)
RDW: 15.9 % — ABNORMAL HIGH (ref 11.5–15.5)
WBC: 5.2 10*3/uL (ref 4.0–10.5)
nRBC: 0 % (ref 0.0–0.2)

## 2022-02-06 LAB — CK: Total CK: 20 U/L — ABNORMAL LOW (ref 38–234)

## 2022-02-06 LAB — MAGNESIUM: Magnesium: 1.9 mg/dL (ref 1.7–2.4)

## 2022-02-06 MED ORDER — BISACODYL 10 MG RE SUPP
10.0000 mg | Freq: Once | RECTAL | Status: DC
  Filled 2022-02-06: qty 1

## 2022-02-06 MED ORDER — POLYETHYLENE GLYCOL 3350 17 G PO PACK
17.0000 g | PACK | Freq: Two times a day (BID) | ORAL | Status: DC
  Administered 2022-02-06: 17 g via ORAL
  Filled 2022-02-06 (×5): qty 1

## 2022-02-06 MED ORDER — MAGNESIUM SULFATE 2 GM/50ML IV SOLN
2.0000 g | Freq: Once | INTRAVENOUS | Status: AC
  Administered 2022-02-06: 2 g via INTRAVENOUS
  Filled 2022-02-06: qty 50

## 2022-02-06 NOTE — Progress Notes (Signed)
PROGRESS NOTE    Susan [name]  QTM:[id number] DOB: 11/20/1950 DOA: 01/31/2022 PCP: [name], MD    Chief Complaint  Patient presents with   Abdominal Pain    Emesis/Diarrhea    Brief Narrative:  71BW with hx systolic chf, DM, CKD 4, afib on eliquis presented with epigastric pain with n/v admitted for acute gallstone pancreatitis.   Assessment & Plan:   Principal Problem:   Acute pancreatitis Active Problems:   Hyperlipemia   Chronic HFrEF (heart failure with reduced ejection fraction) (HCC)   CKD (chronic kidney disease), stage IV (HCC)   Nausea & vomiting   Hypothyroidism   Type 2 diabetes mellitus with diabetic polyneuropathy, with long-term current use of insulin (HCC)   Abdominal pain   SIRS (systemic inflammatory response syndrome) (HCC)   Transaminitis   History of anemia due to chronic kidney disease   Paroxysmal atrial fibrillation (HCC)   Bacteremia   Sepsis without acute organ dysfunction (HCC)  #1 acute gallstone pancreatitis -Patient presented with epigastric abdominal pain, nausea vomiting, lipase levels noted to be elevated at 1341. -Abdominal ultrasound done with gallstones with prominent CBD, no cholecystitis noted.   -Patient seen in consultation by GI, underwent MRCP which showed choledocholithiasis with mild pancreatitis 2 mm stone in the dependent portion of the distal common bile duct and gentle tapering of the duct at the level of the ampulla. -Patient subsequently underwent ERCP on 02/03/2022 consistent with choledocholithiasis, patient underwent biliary sphincterotomy and balloon extraction. -Patient seen in consultation by general surgery with no plans for laparoscopic cholecystectomy during this hospitalization as patient noted not to have an acute infection noted to be high surgical risk for cardiac complication from cardiac standpoint with CKD. -Continue IV antibiotics for bacteremia. -ID following, GI following, general surgery  following. -Diet advanced per general surgery.  2.  Sepsis with Enterococcus bacteremia, POA -On presentation patient noted to have a leukocytosis white count of 20.1, interval increase compared to 2 days ago, mild tachycardia. -Blood cultures obtained positive for Enterococcus with repeat blood cultures pending with no growth to date. -Patient seen by ID, initial recommendations was to come continue ampicillin and follow-up on TEE. -TEE done per cardiology 02/04/2022 negative for endocarditis. -IV antibiotics switched to IV vancomycin per ID and subsequently placed on IV daptomycin, who are following. -Per ID patient will need PICC line placed if repeat blood cultures are negative x72 hours and will need a 2-week course of IV antibiotics from negative culture. -Patient with CKD stage IV, discussed with primary nephrologist, Dr. Hollie Salk who strongly discouraged use placement of PICC line on this patient as patient will likely be on hemodialysis in the near future and recommending single-lumen tunneled central line if long-term IV antibiotics are needed. -Due to restrictions with PICC placement, ID recommending initiation of line linezolid on discharge for 2 more weeks from negative culture with end of therapy 02/16/2022. -Continue IV daptomycin while in-house. -Per ID.  3.  Chronic biventricular systolic heart failure -Diuretics on hold while in-house. -Due to CKD stage IV noted not on ACE inhibitor or ARB. -Patient with some diffuse scattered crackles however seems mostly euvolemic. -IV fluids stopped. -May need to resume home regimen diuretics in the next 1 to 2 days once blood pressure has improved.  Cardiology was following but have signed off.   4.  Poorly controlled type 2 diabetes mellitus -Most recent hemoglobin A1c > 15.5 (01/20/2022) -CBG of 183 this morning. -Continue Levemir to 15 units twice daily.  SSI. -  Patient noted to be on Januvia prior to admission however given presentation  of pancreatitis will likely not resume on discharge. -Outpatient follow-up with PCP.  5.  Hypothyroidism -Synthroid.  6.  Dysuria/?  UTI -Patient complains of some dysuria and suprapubic abdominal pain. -Urinalysis done concerning for UTI. -Urine cultures pending. -Continue IV Rocephin pending urine culture results.    7.  CKD stage IV -Stable. -Repeat labs in the AM.  8.  Anemia of chronic kidney disease -Hemoglobin stable at 8.9.   9.  Hyperlipidemia -Statin on hold, likely resume on discharge.  10.  Paroxysmal atrial fibrillation, CHA2DS2VASC score 6 -Continue amiodarone for rate control. -Patient cleared by general surgery for resumption of Eliquis which was started 02/05/2022.    DVT prophylaxis: Eliquis Code Status: Full Family Communication:  Disposition: Likely home when clinically improved, tolerating current diet, and cleared by GI, general surgery and ID.  Status is: Inpatient Remains inpatient appropriate because: Severity of illness   Consultants:  Gastroenterology: Dr. Silverio Decamp 02/01/2022 ID: Dr. Tommy Medal 02/02/2022 General surgery: Dr. Rosendo Gros 02/04/2022  Procedures:  Chest x-ray 02/01/2022 ERCP 02/03/2022 per Dr. Benson Norway TEE 02/04/2022 per Dr. Haroldine Laws MRCP 02/01/2022 Renal ultrasound 02/02/2022 Right upper quadrant ultrasound 02/01/2022 2D echo 02/02/2022 Bilateral upper extremity Dopplers 01/30/2022  Antimicrobials:  IV ampicillin 02/01/2022>>> 02/02/2022 IV vancomycin 02/02/2022 >>>> 02/05/2022 IV Rocephin 02/05/2022>>>> 02/08/2022 IV daptomycin 02/06/2022>>>>> 02/16/2022   Subjective: Sitting up in bed.  Overall feeling better.  Some complaints of nausea.  Denies any emesis.  Tolerating current diet.  No chest pain.  No shortness of breath.  Some improvement with abdominal pain.  Denies any bleeding.  Stated had bowel movement last night.  Objective: Vitals:   02/05/22 2344 02/06/22 0433 02/06/22 0820 02/06/22 0822  BP: (!) 130/57 (!) 114/55 (!) 99/45 (!) 102/44   Pulse: 67 64 67   Resp: 16 (!) 9 12   Temp: 98 F (36.7 C) 97.9 F (36.6 C) 97.9 F (36.6 C)   TempSrc: Oral Oral Oral   SpO2: 97% 96% 97% 98%  Weight:      Height:        Intake/Output Summary (Last 24 hours) at 02/06/2022 1046 Last data filed at 02/06/2022 0802 Gross per 24 hour  Intake 756.35 ml  Output 450 ml  Net 306.35 ml    Filed Weights   02/03/22 1254 02/04/22 0346 02/05/22 0400  Weight: 107.9 kg 108.3 kg 108.9 kg    Examination:  General exam: NAD. Respiratory system: CTA B.  No wheezes, no crackles, no rhonchi.  Fair air movement.  Speaking in full sentences.  Cardiovascular system: Regular rate rhythm no murmurs rubs or gallops.  No JVD.  No lower extremity edema.  Gastrointestinal system: Abdomen is soft, nondistended, decreased suprapubic tenderness to palpation, positive bowel sounds.  No rebound.  No guarding.   Central nervous system: Alert and oriented. No focal neurological deficits. Extremities: Symmetric 5 x 5 power. Skin: No rashes, lesions or ulcers Psychiatry: Judgement and insight appear normal. Mood & affect appropriate.     Data Reviewed: I have personally reviewed following labs and imaging studies  CBC: Recent Labs  Lab 02/01/22 0615 02/01/22 1314 02/02/22 0119 02/03/22 0600 02/04/22 0542 02/05/22 0600 02/06/22 0543  WBC 25.5*   < > 10.4 6.1 3.7* 5.0 5.2  NEUTROABS 23.9*  --   --   --   --   --   --   HGB 9.7*   < > 9.3* 9.1* 9.6* 9.1* 8.9*  HCT 29.5*   < > 29.2* 28.1* 30.3* 29.0* 27.8*  MCV 84.3   < > 83.4 84.1 84.2 84.8 85.3  PLT 226   < > 160 158 150 138* 123*   < > = values in this interval not displayed.     Basic Metabolic Panel: Recent Labs  Lab 02/01/22 0615 02/01/22 1314 02/02/22 0121 02/03/22 0600 02/04/22 0542 02/05/22 0600 02/06/22 0543  NA 132*   < > 129* 133* 130* 131* 133*  K 3.9   < > 4.1 4.0 5.1 5.0 4.5  CL 90*   < > 91* 96* 94* 99 97*  CO2 26   < > '25 24 23 22 26  '$ GLUCOSE 246*   < > 379* 149* 391*  299* 197*  BUN 94*   < > 91* 85* 84* 84* 82*  CREATININE 3.93*   < > 4.01* 3.71* 3.44* 3.19* 3.29*  CALCIUM 8.5*   < > 8.3* 8.3* 8.4* 8.4* 8.3*  MG 1.8  --   --   --   --   --   --    < > = values in this interval not displayed.     GFR: Estimated Creatinine Clearance: 19.3 mL/min (A) (by C-G formula based on SCr of 3.29 mg/dL (H)).  Liver Function Tests: Recent Labs  Lab 02/01/22 0615 02/01/22 1314 02/02/22 0121 02/03/22 0600 02/04/22 0542  AST 114* 90* 56* 27 23  ALT 61* 56* 44 33 31  ALKPHOS 273* 300* 237* 205* 255*  BILITOT 1.0 0.7 0.9 0.9 1.0  PROT 5.4* 5.6* 5.3* 5.2* 5.7*  ALBUMIN 2.4* 2.3* 2.2* 2.1* 2.3*     CBG: Recent Labs  Lab 02/05/22 1136 02/05/22 1603 02/05/22 2117 02/06/22 0042 02/06/22 0637  GLUCAP 283* 374* 359* 308* 183*      Recent Results (from the past 240 hour(s))  Blood culture (routine x 2)     Status: Abnormal   Collection Time: 02/01/22  5:00 AM   Specimen: BLOOD RIGHT HAND  Result Value Ref Range Status   Specimen Description BLOOD RIGHT HAND  Final   Special Requests   Final    BOTTLES DRAWN AEROBIC AND ANAEROBIC Blood Culture results may not be optimal due to an inadequate volume of blood received in culture bottles   Culture  Setup Time   Final    GRAM POSITIVE COCCI AEROBIC BOTTLE ONLY CRITICAL VALUE NOTED.  VALUE IS CONSISTENT WITH PREVIOUSLY REPORTED AND CALLED VALUE.    Culture (A)  Final    ENTEROCOCCUS FAECIUM SUSCEPTIBILITIES PERFORMED ON PREVIOUS CULTURE WITHIN THE LAST 5 DAYS. Performed at [facility], [place] [address] [place], [place] [address]    Report Status 02/04/2022 FINAL  Final  Blood culture (routine x 2)     Status: Abnormal   Collection Time: 02/01/22  5:00 AM   Specimen: BLOOD RIGHT ARM  Result Value Ref Range Status   Specimen Description BLOOD RIGHT ARM  Final   Special Requests   Final    BOTTLES DRAWN AEROBIC AND ANAEROBIC Blood Culture results may not be optimal due to an inadequate volume  of blood received in culture bottles   Culture  Setup Time   Final    GRAM POSITIVE COCCI IN CHAINS IN BOTH AEROBIC AND ANAEROBIC BOTTLES CRITICAL RESULT CALLED TO, READ BACK BY AND VERIFIED WITH:  C/ PHARMD S. WATSON 02/01/22 0924 A. LAFRANCE  Performed at [facility], [place] [address], [place], [place] [address]  Culture ENTEROCOCCUS FAECIUM ENTEROCOCCUS FAECALIS  (A)  Final   Report Status 02/04/2022 FINAL  Final   Organism ID, Bacteria ENTEROCOCCUS FAECIUM  Final   Organism ID, Bacteria ENTEROCOCCUS FAECALIS  Final      Susceptibility   Enterococcus faecalis - MIC*    AMPICILLIN <=2 SENSITIVE Sensitive     VANCOMYCIN 1 SENSITIVE Sensitive     GENTAMICIN SYNERGY SENSITIVE Sensitive     * ENTEROCOCCUS FAECALIS   Enterococcus faecium - MIC*    AMPICILLIN >=32 RESISTANT Resistant     VANCOMYCIN <=0.5 SENSITIVE Sensitive     GENTAMICIN SYNERGY SENSITIVE Sensitive     * ENTEROCOCCUS FAECIUM  Blood Culture ID Panel (Reflexed)     Status: Abnormal   Collection Time: 02/01/22  5:00 AM  Result Value Ref Range Status   Enterococcus faecalis DETECTED (A) NOT DETECTED Corrected    Comment: CRITICAL RESULT CALLED TO, READ BACK BY AND VERIFIED WITH:  C/ PHARMD S. WATSON 02/01/22 2124 A. LAFRANCE  CORRECTED ON 09/13 AT 0129: PREVIOUSLY REPORTED AS DETECTED CRITICAL RESULT CALLED TO, READ BACK BY AND VERIFIED WITH:  C/ PHARMD S. WATSON 02/01/22 0924 A. LAFRANCE     Enterococcus Faecium DETECTED (A) NOT DETECTED Corrected    Comment: CRITICAL RESULT CALLED TO, READ BACK BY AND VERIFIED WITH:  C/ PHARMD S. WATSON 02/01/22 2124 A. LAFRANCE  CORRECTED ON 09/13 AT 0129: PREVIOUSLY REPORTED AS DETECTED CRITICAL RESULT CALLED TO, READ BACK BY AND VERIFIED WITH:  C/ PHARMD S. WATSON 02/01/22 0924 A. LAFRANCE     Listeria monocytogenes NOT DETECTED NOT DETECTED Final   Staphylococcus species NOT DETECTED NOT DETECTED Final   Staphylococcus aureus (BCID) NOT DETECTED NOT DETECTED Final    Staphylococcus epidermidis NOT DETECTED NOT DETECTED Final   Staphylococcus lugdunensis NOT DETECTED NOT DETECTED Final   Streptococcus species NOT DETECTED NOT DETECTED Final   Streptococcus agalactiae NOT DETECTED NOT DETECTED Final   Streptococcus pneumoniae NOT DETECTED NOT DETECTED Final   Streptococcus pyogenes NOT DETECTED NOT DETECTED Final   A.calcoaceticus-baumannii NOT DETECTED NOT DETECTED Final   Bacteroides fragilis NOT DETECTED NOT DETECTED Final   Enterobacterales NOT DETECTED NOT DETECTED Final   Enterobacter cloacae complex NOT DETECTED NOT DETECTED Final   Escherichia coli NOT DETECTED NOT DETECTED Final   Klebsiella aerogenes NOT DETECTED NOT DETECTED Final   Klebsiella oxytoca NOT DETECTED NOT DETECTED Final   Klebsiella pneumoniae NOT DETECTED NOT DETECTED Final   Proteus species NOT DETECTED NOT DETECTED Final   Salmonella species NOT DETECTED NOT DETECTED Final   Serratia marcescens NOT DETECTED NOT DETECTED Final   Haemophilus influenzae NOT DETECTED NOT DETECTED Final   Neisseria meningitidis NOT DETECTED NOT DETECTED Final   Pseudomonas aeruginosa NOT DETECTED NOT DETECTED Final   Stenotrophomonas maltophilia NOT DETECTED NOT DETECTED Final   Candida albicans NOT DETECTED NOT DETECTED Final   Candida auris NOT DETECTED NOT DETECTED Final   Candida glabrata NOT DETECTED NOT DETECTED Final   Candida krusei NOT DETECTED NOT DETECTED Final   Candida parapsilosis NOT DETECTED NOT DETECTED Final   Candida tropicalis NOT DETECTED NOT DETECTED Final   Cryptococcus neoformans/gattii NOT DETECTED NOT DETECTED Final   Vancomycin resistance NOT DETECTED NOT DETECTED Final    Comment: Performed at [facility], [address] [place], [place] [address]  Culture, blood (Routine X 2) w Reflex to ID Panel     Status: None (Preliminary result)   Collection Time: 02/03/22  9:39 AM  Specimen: BLOOD RIGHT WRIST  Result Value Ref Range Status   Specimen Description  BLOOD RIGHT WRIST  Final   Special Requests   Final    BOTTLES DRAWN AEROBIC AND ANAEROBIC Blood Culture adequate volume   Culture   Final    NO GROWTH 3 DAYS Performed at [facility], [address] [place], [place] [address]    Report Status PENDING  Incomplete  Culture, blood (Routine X 2) w Reflex to ID Panel     Status: None (Preliminary result)   Collection Time: 02/03/22  9:45 AM   Specimen: BLOOD RIGHT FOREARM  Result Value Ref Range Status   Specimen Description BLOOD RIGHT FOREARM  Final   Special Requests   Final    BOTTLES DRAWN AEROBIC AND ANAEROBIC Blood Culture adequate volume   Culture   Final    NO GROWTH 3 DAYS Performed at [facility], [place] [address] [place], [place] [address]    Report Status PENDING  Incomplete         Radiology Studies: [name] EKG SITE RITE  Result Date: 02/06/2022 If Site Rite image not attached, placement could not be confirmed due to current cardiac rhythm.  ECHO TEE  Result Date: 02/04/2022    TRANSESOPHOGEAL ECHO REPORT   Patient Name:   EUDORA GUEVARRA Date of Exam: 02/04/2022 Medical Rec #:  [id number]    Height:       66.0 in Accession #:    [id number]   Weight:       238.8 lb Date of Birth:  [birth year]    BSA:          2.156 m Patient Age:    50 years     BP:           127/55 mmHg Patient Gender: F            HR:           69 bpm. Exam Location:  Inpatient Procedure: Transesophageal Echo, Color Doppler and Cardiac Doppler Indications:     Bacteremia  History:         Patient has prior history of Echocardiogram examinations, most                  recent 02/02/2022. CHF, Arrythmias:Atrial Fibrillation; Risk                  Factors:Hypertension, Diabetes and Dyslipidemia.  Sonographer:     Raquel Sarna Senior RDCS Referring Phys:  [id number] Burna [name] [facility] Diagnosing Phys: Glori Bickers MD PROCEDURE: After discussion of the risks and benefits of a TEE, an informed consent was obtained from the patient. The transesophogeal probe was passed  without difficulty through the esophogus of the patient. Sedation performed by different physician. The patient was monitored while under deep sedation. Anesthestetic sedation was provided intravenously by Anesthesiology: '247mg'$  of Propofol, '100mg'$  of Lidocaine. The patient developed no complications during the procedure. IMPRESSIONS  1. Left ventricular ejection fraction, by estimation, is 30%%. The left ventricle has moderately decreased function.  2. Right ventricular systolic function is moderately reduced. The right ventricular size is moderately enlarged.  3. Left atrial size was moderately dilated. No left atrial/left atrial appendage thrombus was detected.  4. Right atrial size was severely dilated.  5. No vegetation. The mitral valve is normal in structure. Trivial mitral valve regurgitation.  6. No vegetation. Tricuspid valve regurgitation is severe.  7. There is a small filamentous structure on the aortic side of  the valve that is likely a Lambl's excresence. No vegetation. The aortic valve is tricuspid. There is mild calcification of the aortic valve. Aortic valve regurgitation is not visualized.  8. No vegetation.  9. There is Moderate (Grade III) plaque involving the descending aorta. FINDINGS  Left Ventricle: Left ventricular ejection fraction, by estimation, is 30%%. The left ventricle has moderately decreased function. The left ventricular internal cavity size was normal in size. Right Ventricle: The right ventricular size is moderately enlarged. No increase in right ventricular wall thickness. Right ventricular systolic function is moderately reduced. Left Atrium: Left atrial size was moderately dilated. No left atrial/left atrial appendage thrombus was detected. Right Atrium: Right atrial size was severely dilated. Pericardium: There is no evidence of pericardial effusion. Mitral Valve: No vegetation. The mitral valve is normal in structure. Trivial mitral valve regurgitation. Tricuspid Valve: No  vegetation. The tricuspid valve is normal in structure. Tricuspid valve regurgitation is severe. Aortic Valve: There is a small filamentous structure on the aortic side of the valve that is likely a Lambl's excresence. No vegetation. The aortic valve is tricuspid. There is mild calcification of the aortic valve. Aortic valve regurgitation is not visualized. Pulmonic Valve: No vegetation. The pulmonic valve was normal in structure. Pulmonic valve regurgitation is trivial. Aorta: The aortic root is normal in size and structure. There is moderate (Grade III) plaque involving the descending aorta. IAS/Shunts: No atrial level shunt detected by color flow Doppler. Glori Bickers MD Electronically signed by Glori Bickers MD Signature Date/Time: 02/04/2022/6:15:13 PM    Final         Scheduled Meds:  amiodarone  200 mg Oral Daily   apixaban  5 mg Oral BID   bisacodyl  10 mg Rectal Once   carvedilol  6.25 mg Oral BID WC   insulin aspart  0-9 Units Subcutaneous Q6H   insulin detemir  15 Units Subcutaneous BID   levothyroxine  50 mcg Oral QAC breakfast   polyethylene glycol  17 g Oral BID   senna-docusate  1 tablet Oral BID   sodium chloride flush  3 mL Intravenous Q12H   Continuous Infusions:  sodium chloride     cefTRIAXone (ROCEPHIN)  IV Stopped (02/05/22 2044)   DAPTOmycin (CUBICIN) 800 mg in sodium chloride 0.9 % IVPB       LOS: 5 days    Time spent: 40 minutes    Irine Seal, MD [facility]   To contact the attending provider between 7A-7P or the covering provider during after hours 7P-7A, please log into the web site [email or web address] and access using universal [place] password for that web site. If you do not have the password, please call the hospital operator.  02/06/2022, 10:46 AM

## 2022-02-06 NOTE — Progress Notes (Signed)
2 Days Post-Op  Subjective: Tolerating HH diet with no further biliary colic currently.  Objective: Vital signs in last 24 hours: Temp:  [97.4 F (36.3 C)-98 F (36.7 C)] 97.9 F (36.6 C) (09/14 0820) Pulse Rate:  [64-69] 67 (09/14 0820) Resp:  [9-16] 12 (09/14 0820) BP: (99-137)/(44-61) 102/44 (09/14 0822) SpO2:  [96 %-98 %] 98 % (09/14 0822) Last BM Date : 02/02/22  Intake/Output from previous day: 09/13 0701 - 09/14 0700 In: 516.4 [P.O.:240; I.V.:176.4; IV Piggyback:100] Out: 450 [Urine:450] Intake/Output this shift: Total I/O In: 240 [P.O.:240] Out: -   PE: Gen: NAD Abd: soft, tenderness resolving, ND, +BS  Lab Results:  Recent Labs    02/05/22 0600 02/06/22 0543  WBC 5.0 5.2  HGB 9.1* 8.9*  HCT 29.0* 27.8*  PLT 138* 123*   BMET Recent Labs    02/05/22 0600 02/06/22 0543  NA 131* 133*  K 5.0 4.5  CL 99 97*  CO2 22 26  GLUCOSE 299* 197*  BUN 84* 82*  CREATININE 3.19* 3.29*  CALCIUM 8.4* 8.3*   PT/INR No results for input(s): "LABPROT", "INR" in the last 72 hours. CMP     Component Value Date/Time   NA 133 (L) 02/06/2022 0543   K 4.5 02/06/2022 0543   CL 97 (L) 02/06/2022 0543   CO2 26 02/06/2022 0543   GLUCOSE 197 (H) 02/06/2022 0543   GLUCOSE >444 (H) 06/07/2015 1311   BUN 82 (H) 02/06/2022 0543   CREATININE 3.29 (H) 02/06/2022 0543   CREATININE 3.08 (H) 08/05/2021 1648   CALCIUM 8.3 (L) 02/06/2022 0543   PROT 5.7 (L) 02/04/2022 0542   ALBUMIN 2.3 (L) 02/04/2022 0542   AST 23 02/04/2022 0542   ALT 31 02/04/2022 0542   ALKPHOS 255 (H) 02/04/2022 0542   BILITOT 1.0 02/04/2022 0542   GFRNONAA 14 (L) 02/06/2022 0543   GFRNONAA 11 (L) 11/15/2020 1452   GFRAA 13 (L) 11/15/2020 1452   Lipase     Component Value Date/Time   LIPASE 60 (H) 02/02/2022 0121       Studies/Results: [name] EKG SITE RITE  Result Date: 02/06/2022 If Site Rite image not attached, placement could not be confirmed due to current cardiac rhythm.  ECHO  TEE  Result Date: 02/04/2022    TRANSESOPHOGEAL ECHO REPORT   Patient Name:   Susan [name] Date of Exam: 02/04/2022 Medical Rec #:  [id number]    Height:       66.0 in Accession #:    [id number]   Weight:       238.8 lb Date of Birth:  02/16/1951    BSA:          2.156 m Patient Age:    71 years     BP:           127/55 mmHg Patient Gender: F            HR:           69 bpm. Exam Location:  Inpatient Procedure: Transesophageal Echo, Color Doppler and Cardiac Doppler Indications:     Bacteremia  History:         Patient has prior history of Echocardiogram examinations, most                  recent 02/02/2022. CHF, Arrythmias:Atrial Fibrillation; Risk                  Factors:Hypertension, Diabetes and Dyslipidemia.  Sonographer:     Raquel Sarna  Senior RDCS Referring Phys:  [id number] Burna [name] [facility] Diagnosing Phys: Glori Bickers MD PROCEDURE: After discussion of the risks and benefits of a TEE, an informed consent was obtained from the patient. The transesophogeal probe was passed without difficulty through the esophogus of the patient. Sedation performed by different physician. The patient was monitored while under deep sedation. Anesthestetic sedation was provided intravenously by Anesthesiology: '247mg'$  of Propofol, '100mg'$  of Lidocaine. The patient developed no complications during the procedure. IMPRESSIONS  1. Left ventricular ejection fraction, by estimation, is 30%%. The left ventricle has moderately decreased function.  2. Right ventricular systolic function is moderately reduced. The right ventricular size is moderately enlarged.  3. Left atrial size was moderately dilated. No left atrial/left atrial appendage thrombus was detected.  4. Right atrial size was severely dilated.  5. No vegetation. The mitral valve is normal in structure. Trivial mitral valve regurgitation.  6. No vegetation. Tricuspid valve regurgitation is severe.  7. There is a small filamentous structure on the aortic side of the valve that is likely a  Lambl's excresence. No vegetation. The aortic valve is tricuspid. There is mild calcification of the aortic valve. Aortic valve regurgitation is not visualized.  8. No vegetation.  9. There is Moderate (Grade III) plaque involving the descending aorta. FINDINGS  Left Ventricle: Left ventricular ejection fraction, by estimation, is 30%%. The left ventricle has moderately decreased function. The left ventricular internal cavity size was normal in size. Right Ventricle: The right ventricular size is moderately enlarged. No increase in right ventricular wall thickness. Right ventricular systolic function is moderately reduced. Left Atrium: Left atrial size was moderately dilated. No left atrial/left atrial appendage thrombus was detected. Right Atrium: Right atrial size was severely dilated. Pericardium: There is no evidence of pericardial effusion. Mitral Valve: No vegetation. The mitral valve is normal in structure. Trivial mitral valve regurgitation. Tricuspid Valve: No vegetation. The tricuspid valve is normal in structure. Tricuspid valve regurgitation is severe. Aortic Valve: There is a small filamentous structure on the aortic side of the valve that is likely a Lambl's excresence. No vegetation. The aortic valve is tricuspid. There is mild calcification of the aortic valve. Aortic valve regurgitation is not visualized. Pulmonic Valve: No vegetation. The pulmonic valve was normal in structure. Pulmonic valve regurgitation is trivial. Aorta: The aortic root is normal in size and structure. There is moderate (Grade III) plaque involving the descending aorta. IAS/Shunts: No atrial level shunt detected by color flow Doppler. Glori Bickers MD Electronically signed by Glori Bickers MD Signature Date/Time: 02/04/2022/6:15:13 PM    Final     Anti-infectives: Anti-infectives (From admission, onward)    Start     Dose/Rate Route Frequency Ordered Stop   02/06/22 2000  DAPTOmycin (CUBICIN) 800 mg in sodium  chloride 0.9 % IVPB        800 mg 132 mL/hr over 30 Minutes Intravenous Every 48 hours 02/05/22 1106 02/16/22 1959   02/05/22 1930  cefTRIAXone (ROCEPHIN) 2 g in sodium chloride 0.9 % 100 mL IVPB        2 g 200 mL/hr over 30 Minutes Intravenous Every 24 hours 02/05/22 1831     02/04/22 1430  vancomycin (VANCOREADY) IVPB 750 mg/150 mL  Status:  Discontinued        750 mg 150 mL/hr over 60 Minutes Intravenous Every 48 hours 02/02/22 1438 02/05/22 2029   02/02/22 1530  vancomycin (VANCOCIN) 2,500 mg in sodium chloride 0.9 % 500 mL IVPB  2,500 mg 262.5 mL/hr over 120 Minutes Intravenous  Once 02/02/22 1438 02/02/22 1756   02/01/22 2230  ampicillin (OMNIPEN) 2 g in sodium chloride 0.9 % 100 mL IVPB  Status:  Discontinued        2 g 300 mL/hr over 20 Minutes Intravenous Every 12 hours 02/01/22 2139 02/02/22 1438        Assessment/Plan Gallstone pancreatitis, choledocholithiasis, Enterococcus bacteremia, H/O biliary colic -appreciate cardiology evaluation.  Agree with patient's risk stratification -given the patient does not have an acute infection of her gallbladder, our hands are not forced to consider a lap chole.  She has had an ERCP with ductal clearing and sphincterotomy which gives her a higher percent chance that if she passes another stone it will pass and not get stuck causing cholangitis or pancreatitis.   -because of this, we will hold off on surgical intervention at this time.   -she does still have intermittent biliary colic for which she may continue to have but right now risks outweigh benefit.  If her symptoms continue to persist and she gets to the point where she is willing to risk surgery, then this is something that we can discuss on an elective basis.  She is already on ursodiol, so not much else to offer for her gb at this time, except low fat diet -tolerated carb mod/HH diet well yesterday. -no further surgical needs at this time.  She may follow up on a prn basis.   We will sign off at this time.   FEN - carb mod/HH VTE - eliquis  ID - Vanc   Uncontrolled DM CHF with EF of 30% Enterococcus bacteremia HLD Anxiety/depression Stage IV CKD CAD, H/O multiple MIs with stents HTN H/O CVA Hypothyroidism  I reviewed Consultant cardiology notes, hospitalist notes, last 24 h vitals and pain scores, last 48 h intake and output, last 24 h labs and trends, and last 24 h imaging results.   LOS: 5 days    Henreitta Cea , [facility] 02/06/2022, 9:25 AM Please see Amion for pager number during day hours 7:00am-4:30pm or 7:00am -11:30am on weekends

## 2022-02-06 NOTE — Progress Notes (Signed)
This RN went to bedside to discuss PICC placement and obtain consent. Discussed at length risks/benefits of PICC and answered questions. Patient remains hesitant about PICC placement at this time, wishes to discuss with her husband this evening before agreeing to placement. Encourage patient to ask any further questions she may have. Requested patient to let primary RN know once she makes decision for PICC team to proceed with placement or not.

## 2022-02-06 NOTE — Plan of Care (Signed)
  Problem: Education: Goal: Knowledge of General Education information will improve Description: Including pain rating scale, medication(s)/side effects and non-pharmacologic comfort measures Outcome: Progressing   Problem: Health Behavior/Discharge Planning: Goal: Ability to manage health-related needs will improve Outcome: Progressing   Problem: Clinical Measurements: Goal: Diagnostic test results will improve Outcome: Progressing   Problem: Nutrition: Goal: Adequate nutrition will be maintained Outcome: Progressing   Problem: Skin Integrity: Goal: Risk for impaired skin integrity will decrease Outcome: Progressing

## 2022-02-06 NOTE — Inpatient Diabetes Management (Signed)
Inpatient Diabetes Program Recommendations  AACE/ADA: New Consensus Statement on Inpatient Glycemic Control (2015)  Target Ranges:  Prepandial:   less than 140 mg/dL      Peak postprandial:   less than 180 mg/dL (1-2 hours)      Critically ill patients:  140 - 180 mg/dL   Lab Results  Component Value Date   GLUCAP 134 (H) 02/06/2022   HGBA1C >15.5 (H) 01/20/2022    Review of Glycemic Control  Diabetes history: DM1 (requires basal, meal coverage, + correction) Per Lovena Le from Dr. Almetta Lovely office, patients insulin pump settings are:  MN-5A- 1.05 units/hr  5A-9A- 1.15 units/hr  9A-3P- 1.8 units/hr  3P-MN- 1 unit/hr  Total 24 hour basal is 29.65 units/24 hours-  Patient uses Regular insulin in insulin pump and has a SSI that she uses if blood sugars are really high and also has NPH 15 units prn if blood sugar >300 mg/dL. Current orders for Inpatient glycemic control: Levemir 15 units BID, Novolog 0-9 units Q6H  Inpatient Diabetes Program Recommendations:   Spoke with patient @ bedside. Patient is not comfortable managing her insulin pump @ this time. Patient's husband has primarily been managing her insulin pump and insulin @ home. Patient agrees to continue to check CBGs post discharge and take with her to Dr. Almetta Lovely office visits to assist with insulin dosing. If post prandial CBGs are elevated, please consider adding Novolog 2 units tid meal coverage if eats 50%.   Thank you, Nani Gasser. Sheba Whaling, RN, MSN, CDE  Diabetes Coordinator Inpatient Glycemic Control Team Team Pager [phone] (8am-5pm) 02/06/2022 2:59 PM

## 2022-02-06 NOTE — Progress Notes (Signed)
At [place] patient had a 12 beat run of wide QRS/V-tach. At [place] patient had a 10 beat run of wide QRS/V-tach. Patient is asymptomatic during these.

## 2022-02-06 NOTE — Progress Notes (Signed)
Mobility Specialist Progress Note    02/06/22 1154  Mobility  Activity Ambulated with assistance in hallway  Level of Assistance +2 (takes two people) (chair follow)  Assistive Device Front wheel walker  Distance Ambulated (ft) 115 ft (70+45)  Activity Response Tolerated well  $Mobility charge 1 Mobility   Pre-Mobility: 70 HR, 97% SpO2 Post-Mobility: 80 HR, 96% SpO2  Pt received in bed and agreeable. Pt minA to stand. Maintained SpO2 >/=88% on RA. C/o legs feeling weak. Took x1 seated rest break. Returned in chair with call bell in reach.    Hildred Alamin Mobility Specialist

## 2022-02-06 NOTE — Evaluation (Signed)
Physical Therapy Evaluation Patient Details Name: Susan [name] MRN: [id number] DOB: [birth year] Today's Date: 02/06/2022  History of Present Illness  Pt is a 71 y.o. female admitted 01/31/22 with worsening abdominal pain. Workup for gallstone pancreatitis. S/p ERCP 9/11. Course complicated by sepsis with enterococcus bacteremia. S/p TEE 9/12; no evidence of endocarditis.   Of note, recent admission 01/14/22-01/23/22 for DKA, HF, afib with RVR. Other PMH includes HF (LVEF 30-35%), CKD IV, anemia, DM2, HTN, HLD, PAF on Eliquis, chronic hypoxic respiratory failure (2L O2 at night), PAD s/p R transmet amputation.   Clinical Impression  Pt presents with an overall decrease in functional mobility secondary to above. PTA, pt mod indep ambulating with SPC, had worked with HHPT 1x before readmission; lives with husband who works. Today, pt able to transfer and ambulate with RW and min guard. Pt limited by generalized weakness, decreased activity tolerance and impaired balance strategies/postural reactions. Pt would benefit from continued acute PT services to maximize functional mobility and independence prior to d/c with HHPT.      Recommendations for follow up therapy are one component of a multi-disciplinary discharge planning process, led by the attending physician.  Recommendations may be updated based on patient status, additional functional criteria and insurance authorization.  Follow Up Recommendations Home health PT      Assistance Recommended at Discharge Intermittent Supervision/Assistance  Patient can return home with the following  A little help with walking and/or transfers;A little help with bathing/dressing/bathroom;Assistance with cooking/housework;Assist for transportation;Help with stairs or ramp for entrance    Equipment Recommendations None recommended by PT  Recommendations for Other Services       Functional Status Assessment Patient has had a recent decline in their functional  status and demonstrates the ability to make significant improvements in function in a reasonable and predictable amount of time.     Precautions / Restrictions Precautions Precautions: Fall Restrictions Weight Bearing Restrictions: No      Mobility  Bed Mobility               General bed mobility comments: Received sitting in recliner    Transfers Overall transfer level: Needs assistance Equipment used: Rolling walker (2 wheels) Transfers: Sit to/from Stand Sit to Stand: Min guard           General transfer comment: reliant on momentum to power up to standing, heavy use of UE support, min guard for balance    Ambulation/Gait Ambulation/Gait assistance: Min guard Gait Distance (Feet): 120 Feet Assistive device: Rolling walker (2 wheels) Gait Pattern/deviations: Step-through pattern, Decreased stride length, Wide base of support, Trunk flexed Gait velocity: Decreased     General Gait Details: slow, fatigued gait with RW and min guard for balance; forward flexed posture; distance limited by BLE fatigue  Stairs            Wheelchair Mobility    Modified Rankin (Stroke Patients Only)       Balance Overall balance assessment: Needs assistance Sitting-balance support: Feet supported Sitting balance-Leahy Scale: Fair     Standing balance support: During functional activity, Reliant on assistive device for balance, Single extremity supported Standing balance-Leahy Scale: Poor                               Pertinent Vitals/Pain Pain Assessment Pain Assessment: Faces Faces Pain Scale: Hurts a little bit Pain Location: bilateral knees Pain Descriptors / Indicators: Tiring Pain Intervention(s): Monitored during session  Home Living Family/patient expects to be discharged to:: Private residence Living Arrangements: Spouse/significant other Available Help at Discharge: Family;Available PRN/intermittently Type of Home: House Home  Access: Ramped entrance       Home Layout: One level Home Equipment: Grab bars - tub/shower;Rolling Walker (2 wheels);Rollator (4 wheels);BSC/3in1;Cane - quad Additional Comments: husband works as Administrator during day, typically home by 6p    Prior Function Prior Level of Function : Independent/Modified Independent;Driving             Mobility Comments: Mod indep ambulating with RW; drives, does grocery shopping. had 1x session with HHPT before hospital readmission ADLs Comments: typically stands to shower     Hand Dominance        Extremity/Trunk Assessment   Upper Extremity Assessment Upper Extremity Assessment: Generalized weakness    Lower Extremity Assessment Lower Extremity Assessment: Generalized weakness (significant BLE edema limiting motion; gross observed functional hip/knee strength at least 3/5)    Cervical / Trunk Assessment Cervical / Trunk Assessment: Kyphotic;Other exceptions Cervical / Trunk Exceptions: increased body habitus, chorea type movement of chin/neck at times  Communication   Communication: No difficulties  Cognition Arousal/Alertness: Awake/alert Behavior During Therapy: WFL for tasks assessed/performed Overall Cognitive Status: No family/caregiver present to determine baseline cognitive functioning Area of Impairment: Awareness, Problem solving                           Awareness: Emergent Problem Solving: Requires verbal cues          General Comments      Exercises     Assessment/Plan    PT Assessment Patient needs continued PT services  PT Problem List Decreased strength;Decreased mobility;Decreased safety awareness;Decreased activity tolerance;Decreased balance;Decreased knowledge of use of DME;Obesity       PT Treatment Interventions Gait training;Balance training;DME instruction;Therapeutic exercise;Functional mobility training;Therapeutic activities;Patient/family education    PT Goals (Current goals  can be found in the Care Plan section)  Acute Rehab PT Goals Patient Stated Goal: return home with continued HHPT services PT Goal Formulation: With patient Time For Goal Achievement: 02/20/22 Potential to Achieve Goals: Good    Frequency Min 3X/week     Co-evaluation               AM-PAC PT "6 Clicks" Mobility  Outcome Measure Help needed turning from your back to your side while in a flat bed without using bedrails?: A Little Help needed moving from lying on your back to sitting on the side of a flat bed without using bedrails?: A Little Help needed moving to and from a bed to a chair (including a wheelchair)?: A Little Help needed standing up from a chair using your arms (e.g., wheelchair or bedside chair)?: A Little Help needed to walk in hospital room?: A Little Help needed climbing 3-5 steps with a railing? : A Lot 6 Click Score: 17    End of Session Equipment Utilized During Treatment: Gait belt Activity Tolerance: Patient tolerated treatment well Patient left: in chair;with call bell/phone within reach Nurse Communication: Mobility status;Other (comment) (need for new purewick) PT Visit Diagnosis: Other abnormalities of gait and mobility (R26.89);Difficulty in walking, not elsewhere classified (R26.2);Muscle weakness (generalized) (M62.81)    Time: 1497-0263 PT Time Calculation (min) (ACUTE ONLY): 18 min   Charges:   PT Evaluation $PT Eval Moderate Complexity: 1 Mod        Mabeline Caras, PT, DPT Acute Rehabilitation Services  Personal: [facility]  Office: [place] 02/06/2022, 5:33 PM

## 2022-02-06 NOTE — Progress Notes (Addendum)
[facility]  Date of Admission:  01/31/2022   Total days of inpatient antibiotics 2  Principal Problem:   Acute pancreatitis Active Problems:   Hyperlipemia   Chronic HFrEF (heart failure with reduced ejection fraction) (HCC)   CKD (chronic kidney disease), stage IV (HCC)   Nausea & vomiting   Hypothyroidism   Type 2 diabetes mellitus with diabetic polyneuropathy, with long-term current use of insulin (HCC)   Abdominal pain   SIRS (systemic inflammatory response syndrome) (HCC)   Transaminitis   History of anemia due to chronic kidney disease   Paroxysmal atrial fibrillation (HCC)   Bacteremia   Sepsis without acute organ dysfunction (HCC)          Assessment: Susan [name] admitted with:  #Enterococcus faecalis and E faecium bacteremia suspect 2/2 GI source #Choledocholithiasis with mild pancreatitis #Dm-poorly controlled(A1C >15.5 on 8/28) #Diarrhea-resolved #Onychomycosis #AKI on  CKD -TTE showed more thickened and calcified aortic valve compared to prior study. 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 - Abdominal ultrasound showed gallstones with prominent CBD no CBD stones visualized. -GI was consulted.  MRCP showed choledocholithiasis with mild pancreatitis(2 mm stone in the dependent portion of distal common bile duct and gentle tapering of the duct at level of the ampulla).  ERCP on 9/11 showed choledocholithiasis.  Patient underwent biliary sphincterectomy and balloon extraction. Still has some biliary colic. General surgery following no plans for lap chole a this point as pt not though to have an acute infection. Per cards pt has  surgical risk for cardiac complication and CKD progressing to ESRD may require HD.  -D/C vancomycin to avoid further nephrotxicity Recommendations: AMMENDUM is bold -D/C daptomycin on discharge and start linezolid 2 weeks of  antibiotics from negative Cx EOT 9/24 Pt is afraid she will not ve able to use PICC line on her own at home.  Given her creatinine clearance of 19.3, nephrology was engaged by primary and would not recommend a PICC line.  Given patient's apprehension towards PICC line in the other possibility being a tunneled catheter will transition to the linezolid to complete antibiotic coarse -Follow up  with ID scheduled on 9/25  #Dysuria -Started about 1.5 days ago -UA showed large leukocytes and negative nitrites -Follow urine Cx, ok to continue ceftriaxone for now, can transition to cefadroxil on discharge to complete 5 days of antibiotics  ID will sign off Microbiology:   Antibiotics: 9/9 Ampicillin  Vancomycin 9/10-9/12 Daptomycin Ceftriaxone 9/13 Cultures: Blood 9/8 1/2 E faecium and E faecalis and 1/2 E faecium 9/11 NG   SUBJECTIVE: Resting in bed. Reports dysuria for 1.5 days Interval: afebrile  overnight Review of Systems: Review of Systems  All other systems reviewed and are negative.    Scheduled Meds:  amiodarone  200 mg Oral Daily   apixaban  5 mg Oral BID   bisacodyl  10 mg Rectal Once   carvedilol  6.25 mg Oral BID WC   insulin aspart  0-9 Units Subcutaneous Q6H   insulin detemir  15 Units Subcutaneous BID   levothyroxine  50 mcg Oral QAC breakfast   polyethylene glycol  17 g Oral BID   senna-docusate  1 tablet Oral BID   sodium chloride flush  3 mL Intravenous Q12H   Continuous Infusions:  sodium chloride     cefTRIAXone (ROCEPHIN)  IV Stopped (02/05/22 2044)   DAPTOmycin (  CUBICIN) 800 mg in sodium chloride 0.9 % IVPB     PRN Meds:.sodium chloride, acetaminophen **OR** acetaminophen, fentaNYL (SUBLIMAZE) injection, loperamide, LORazepam, naLOXone (NARCAN)  injection, mouth rinse, sodium chloride flush Allergies  Allergen Reactions   Keflex [Cephalexin] Diarrhea   Codeine Nausea And Vomiting and Other (See Comments)    OBJECTIVE: Vitals:   02/05/22 2344  02/06/22 0433 02/06/22 0820 02/06/22 0822  BP: (!) 130/57 (!) 114/55 (!) 99/45 (!) 102/44  Pulse: 67 64 67   Resp: 16 (!) 9 12   Temp: 98 F (36.7 C) 97.9 F (36.6 C) 97.9 F (36.6 C)   TempSrc: Oral Oral Oral   SpO2: 97% 96% 97% 98%  Weight:      Height:       Body mass index is 38.75 kg/m.  Physical Exam Constitutional:      Appearance: Normal appearance.  HENT:     Head: Normocephalic and atraumatic.     Right Ear: Tympanic membrane normal.     Left Ear: Tympanic membrane normal.     Nose: Nose normal.     Mouth/Throat:     Mouth: Mucous membranes are moist.  Eyes:     Extraocular Movements: Extraocular movements intact.     Conjunctiva/sclera: Conjunctivae normal.     Pupils: Pupils are equal, round, and reactive to light.  Cardiovascular:     Rate and Rhythm: Normal rate and regular rhythm.     Heart sounds: No murmur heard.    No friction rub. No gallop.  Pulmonary:     Effort: Pulmonary effort is normal.     Breath sounds: Normal breath sounds.  Abdominal:     General: Abdomen is flat.     Palpations: Abdomen is soft.  Musculoskeletal:        General: Normal range of motion.  Skin:    General: Skin is warm and dry.  Neurological:     General: No focal deficit present.     Mental Status: She is alert and oriented to person, place, and time.  Psychiatric:        Mood and Affect: Mood normal.       Lab Results Lab Results  Component Value Date   WBC 5.2 02/06/2022   HGB 8.9 (L) 02/06/2022   HCT 27.8 (L) 02/06/2022   MCV 85.3 02/06/2022   PLT 123 (L) 02/06/2022    Lab Results  Component Value Date   CREATININE 3.29 (H) 02/06/2022   BUN 82 (H) 02/06/2022   NA 133 (L) 02/06/2022   K 4.5 02/06/2022   CL 97 (L) 02/06/2022   CO2 26 02/06/2022    Lab Results  Component Value Date   ALT 31 02/04/2022   AST 23 02/04/2022   GGT 144 (H) 02/01/2022   ALKPHOS 255 (H) 02/04/2022   BILITOT 1.0 02/04/2022        [name], [facility] 02/06/2022, 11:12 AM

## 2022-02-07 ENCOUNTER — Other Ambulatory Visit (HOSPITAL_COMMUNITY): Payer: Self-pay

## 2022-02-07 DIAGNOSIS — I5022 Chronic systolic (congestive) heart failure: Secondary | ICD-10-CM | POA: Diagnosis not present

## 2022-02-07 DIAGNOSIS — K851 Biliary acute pancreatitis without necrosis or infection: Secondary | ICD-10-CM | POA: Diagnosis not present

## 2022-02-07 DIAGNOSIS — R7881 Bacteremia: Secondary | ICD-10-CM | POA: Diagnosis not present

## 2022-02-07 DIAGNOSIS — N184 Chronic kidney disease, stage 4 (severe): Secondary | ICD-10-CM | POA: Diagnosis not present

## 2022-02-07 LAB — CBC
HCT: 30.2 % — ABNORMAL LOW (ref 36.0–46.0)
Hemoglobin: 9.4 g/dL — ABNORMAL LOW (ref 12.0–15.0)
MCH: 26.7 pg (ref 26.0–34.0)
MCHC: 31.1 g/dL (ref 30.0–36.0)
MCV: 85.8 fL (ref 80.0–100.0)
Platelets: 143 10*3/uL — ABNORMAL LOW (ref 150–400)
RBC: 3.52 MIL/uL — ABNORMAL LOW (ref 3.87–5.11)
RDW: 15.9 % — ABNORMAL HIGH (ref 11.5–15.5)
WBC: 6.7 10*3/uL (ref 4.0–10.5)
nRBC: 0 % (ref 0.0–0.2)

## 2022-02-07 LAB — BASIC METABOLIC PANEL
Anion gap: 12 (ref 5–15)
BUN: 77 mg/dL — ABNORMAL HIGH (ref 8–23)
CO2: 25 mmol/L (ref 22–32)
Calcium: 8.6 mg/dL — ABNORMAL LOW (ref 8.9–10.3)
Chloride: 100 mmol/L (ref 98–111)
Creatinine, Ser: 3.12 mg/dL — ABNORMAL HIGH (ref 0.44–1.00)
GFR, Estimated: 15 mL/min — ABNORMAL LOW (ref >60)
Glucose, Bld: 110 mg/dL — ABNORMAL HIGH (ref 70–99)
Potassium: 4.6 mmol/L (ref 3.5–5.1)
Sodium: 137 mmol/L (ref 135–145)

## 2022-02-07 LAB — GLUCOSE, CAPILLARY
Glucose-Capillary: 115 mg/dL — ABNORMAL HIGH (ref 70–99)
Glucose-Capillary: 127 mg/dL — ABNORMAL HIGH (ref 70–99)
Glucose-Capillary: 281 mg/dL — ABNORMAL HIGH (ref 70–99)

## 2022-02-07 LAB — MAGNESIUM: Magnesium: 2.2 mg/dL (ref 1.7–2.4)

## 2022-02-07 MED ORDER — NYSTATIN 100000 UNIT/GM EX POWD: 1.0000 | Freq: Two times a day (BID) | CUTANEOUS | Status: DC | PRN

## 2022-02-07 MED ORDER — ASPIRIN 81 MG PO TBEC
81.0000 mg | DELAYED_RELEASE_TABLET | Freq: Every day | ORAL | Status: DC
  Administered 2022-02-08 – 2022-02-11 (×4): 81 mg via ORAL
  Filled 2022-02-07 (×4): qty 1

## 2022-02-07 MED ORDER — PREGABALIN 75 MG PO CAPS
150.0000 mg | ORAL_CAPSULE | Freq: Two times a day (BID) | ORAL | Status: DC
  Administered 2022-02-07 – 2022-02-11 (×8): 150 mg via ORAL
  Filled 2022-02-07 (×8): qty 2

## 2022-02-07 MED ORDER — MAGNESIUM OXIDE -MG SUPPLEMENT 400 (240 MG) MG PO TABS
400.0000 mg | ORAL_TABLET | Freq: Every day | ORAL | Status: DC
  Administered 2022-02-07 – 2022-02-11 (×5): 400 mg via ORAL
  Filled 2022-02-07 (×5): qty 1

## 2022-02-07 MED ORDER — POLYETHYLENE GLYCOL 3350 17 G PO PACK: 17.0000 g | PACK | Freq: Every day | ORAL | Status: DC | PRN

## 2022-02-07 MED ORDER — URSODIOL 300 MG PO CAPS
600.0000 mg | ORAL_CAPSULE | Freq: Three times a day (TID) | ORAL | Status: DC
  Administered 2022-02-07 – 2022-02-11 (×12): 600 mg via ORAL
  Filled 2022-02-07 (×13): qty 2

## 2022-02-07 MED ORDER — TORSEMIDE 20 MG PO TABS
40.0000 mg | ORAL_TABLET | Freq: Every day | ORAL | Status: DC
  Administered 2022-02-07 – 2022-02-08 (×2): 40 mg via ORAL
  Filled 2022-02-07 (×2): qty 2

## 2022-02-07 MED ORDER — PROCHLORPERAZINE EDISYLATE 10 MG/2ML IJ SOLN
5.0000 mg | Freq: Four times a day (QID) | INTRAMUSCULAR | Status: DC | PRN
  Administered 2022-02-08: 5 mg via INTRAVENOUS
  Filled 2022-02-07 (×2): qty 1

## 2022-02-07 MED ORDER — LINEZOLID 600 MG PO TABS
600.0000 mg | ORAL_TABLET | Freq: Two times a day (BID) | ORAL | 0 refills | Status: DC
  Filled 2022-02-07: qty 20, 10d supply, fill #0

## 2022-02-07 NOTE — Progress Notes (Signed)
PT Cancellation Note  Patient Details Name: Logyn Dedominicis MRN: [id number] DOB: [birth year]   Cancelled Treatment:    Reason Eval/Treat Not Completed: Other (comment)  Attempted x 2 this morning.  Pt reports nauseated (had been medicated), checked back 30 mins later and just got lunch.  Will f/u as able. Abran Richard, PT Acute Rehab [facility] [phone]  Karlton Lemon 02/07/2022, 12:39 PM

## 2022-02-07 NOTE — Evaluation (Signed)
Occupational Therapy Evaluation Patient Details Name: Susan [name] MRN: [id number] DOB: Dec 16, 1950 Today's Date: 02/07/2022   History of Present Illness Pt is a 71 y.o. female admitted 01/31/22 with worsening abdominal pain. Workup for gallstone pancreatitis. S/p ERCP 9/11. Course complicated by sepsis with enterococcus bacteremia. S/p TEE 9/12; no evidence of endocarditis.   Of note, recent admission 01/14/22-01/23/22 for DKA, HF, afib with RVR. Other PMH includes HF (LVEF 30-35%), CKD IV, anemia, DM2, HTN, HLD, PAF on Eliquis, chronic hypoxic respiratory failure (2L O2 at night), PAD s/p R transmet amputation.   Clinical Impression   This 71 yo female admitted with above presents to acute OT with PLOF of being Mod I with basic ADLs, able to fix herself food, and drive prior to her other admission 2 weeks ago. She currently is setup/S-min A for basic ADLs (with total A for her ted hose) from RW level. She will continue to benefit from acute OT with follow up [place].      Recommendations for follow up therapy are one component of a multi-disciplinary discharge planning process, led by the attending physician.  Recommendations may be updated based on patient status, additional functional criteria and insurance authorization.   Follow Up Recommendations  Home health OT    Assistance Recommended at Discharge Intermittent Supervision/Assistance  Patient can return home with the following A little help with walking and/or transfers;A little help with bathing/dressing/bathroom;Assistance with cooking/housework;Assist for transportation;Help with stairs or ramp for entrance    Functional Status Assessment  Patient has had a recent decline in their functional status and demonstrates the ability to make significant improvements in function in a reasonable and predictable amount of time.  Equipment Recommendations  None recommended by OT       Precautions / Restrictions Precautions Precautions:  Fall Precaution Comments: pt reports special orthotic shoes at home for R TMA but family cannot bring to hospital Restrictions Weight Bearing Restrictions: No      Mobility Bed Mobility Overal bed mobility: Needs Assistance Bed Mobility: Rolling, Sidelying to Sit Rolling: Min guard Sidelying to sit: Min assist            Transfers Overall transfer level: Needs assistance Equipment used: Rolling walker (2 wheels) Transfers: Sit to/from Stand Sit to Stand: Min guard, From elevated surface (same height as her bed at home)                  Balance Overall balance assessment: Needs assistance Sitting-balance support: No upper extremity supported, Feet supported Sitting balance-Leahy Scale: Good     Standing balance support: Single extremity supported, During functional activity Standing balance-Leahy Scale: Fair Standing balance comment: standing at sink to brush her teeth                           ADL either performed or assessed with clinical judgement   ADL Overall ADL's : Needs assistance/impaired Eating/Feeding: Independent;Sitting   Grooming: Set up;Supervision/safety;Standing;Oral care   Upper Body Bathing: Supervision/ safety;Set up;Sitting   Lower Body Bathing: Min guard;Sit to/from stand   Upper Body Dressing : Set up;Supervision/safety;Sitting   Lower Body Dressing: Minimal assistance Lower Body Dressing Details (indicate cue type and reason): min guard A sit<>stand Toilet Transfer: Min guard;Ambulation;Rolling walker (2 wheels);Comfort height toilet;Grab bars   Toileting- Clothing Manipulation and Hygiene: Min guard;Sit to/from stand               Vision Baseline Vision/History: 0 No visual deficits Patient  Visual Report: No change from baseline              Pertinent Vitals/Pain Pain Assessment Pain Assessment: Faces Faces Pain Scale: Hurts little more Pain Location: bilateral knees Pain Descriptors / Indicators:  Aching, Sore Pain Intervention(s): Limited activity within patient's tolerance, Monitored during session, Repositioned     Hand Dominance Right   Extremity/Trunk Assessment Upper Extremity Assessment Upper Extremity Assessment: Overall WFL for tasks assessed RUE Deficits / Details: decreased AROM shoulders LUE Deficits / Details: decreased AROM shoulders           Communication Communication Communication: No difficulties   Cognition Arousal/Alertness: Awake/alert Behavior During Therapy: WFL for tasks assessed/performed Overall Cognitive Status: No family/caregiver present to determine baseline cognitive functioning Area of Impairment: Awareness, Problem solving                       Following Commands: Follows one step commands consistently, Follows multi-step commands with increased time   Awareness: Emergent Problem Solving: Requires verbal cues General Comments: able to do more but fearful of falling     General Comments  VA stable on RA throughout session of standing at sink and toileting in bathroom            [place] expects to be discharged to:: Private residence Living Arrangements: Spouse/significant other Available Help at Discharge: Family;Available PRN/intermittently Type of Home: House Home Access: Ramped entrance     Home Layout: One level     Bathroom Shower/Tub: Tub/shower unit;Door   Bathroom Toilet: Handicapped height     Home Equipment: Grab bars - tub/shower;Rolling Environmental consultant (2 wheels);Rollator (4 wheels);BSC/3in1;Cane - quad;Shower seat   Additional Comments: husband works as Administrator during day, typically home by 6p; shower seat does not fit in her tub  Lives With: Spouse    Prior Functioning/Environment Prior Level of Function : Independent/Modified Independent;Driving             Mobility Comments: Mod indep ambulating with RW; drives, does grocery shopping. had 1x session with HHPT before  hospital readmission ADLs Comments: typically stands to shower        OT Problem List: Decreased range of motion;Decreased strength;Impaired balance (sitting and/or standing);Pain      OT Treatment/Interventions: Self-care/ADL training;DME and/or AE instruction;Patient/family education;Balance training;Therapeutic exercise;Therapeutic activities    OT Goals(Current goals can be found in the care plan section) Acute Rehab OT Goals Patient Stated Goal: to go home but not too soon OT Goal Formulation: With patient Time For Goal Achievement: 02/21/22 Potential to Achieve Goals: Good  OT Frequency: Min 2X/week       AM-PAC OT "6 Clicks" Daily Activity     Outcome Measure Help from another person eating meals?: None Help from another person taking care of personal grooming?: A Little Help from another person toileting, which includes using toliet, bedpan, or urinal?: A Little Help from another person bathing (including washing, rinsing, drying)?: A Little Help from another person to put on and taking off regular upper body clothing?: A Little Help from another person to put on and taking off regular lower body clothing?: A Lot 6 Click Score: 18   End of Session Equipment Utilized During Treatment: Rolling walker (2 wheels);Gait belt Nurse Communication:  (pt reports nausea at end of session, chair alarm on but no cord to connect to wall)  Activity Tolerance: Patient tolerated treatment well Patient left: in chair;with call bell/phone within reach;with chair alarm set  OT Visit  Diagnosis: Unsteadiness on feet (R26.81);Other abnormalities of gait and mobility (R26.89);Muscle weakness (generalized) (M62.81) Pain - Right/Left:  (both) Pain - part of body: Knee                Time: [id number] OT Time Calculation (min): 50 min Charges:  OT General Charges $OT Visit: 1 Visit OT Evaluation $OT Eval Moderate Complexity: 1 Mod OT Treatments $Self Care/Home Management : 23-37 mins Susan [name], OTR/L [facility] Re[facility] [phone] Office [phone]    Almon Register 02/07/2022, 10:00 AM

## 2022-02-07 NOTE — Progress Notes (Signed)
   02/07/22 1558  Mobility  Activity Ambulated with assistance in hallway  Level of Assistance Minimal assist, patient does 75% or more  Assistive Device Four wheel walker  Distance Ambulated (ft) 38 ft  Activity Response Tolerated well  $Mobility charge 1 Mobility   Mobility Specialist Progress Note  Received pt in chair having no complaints and agreeable to mobility. Pt was asymptomatic throughout ambulation and returned to room w/o fault. Left in bed w/ call bell in reach and all needs met.   Susan [name] Mobility Specialist

## 2022-02-07 NOTE — Progress Notes (Signed)
PROGRESS NOTE    Susan [name]  CMK:[id number] DOB: Jan 17, 1951 DOA: 01/31/2022 PCP: [name], MD    Chief Complaint  Patient presents with   Abdominal Pain    Emesis/Diarrhea    Brief Narrative:  55PV with hx systolic chf, DM, CKD 4, afib on eliquis presented with epigastric pain with n/v admitted for acute gallstone pancreatitis.   Assessment & Plan:   Principal Problem:   Acute pancreatitis Active Problems:   Hyperlipemia   Chronic HFrEF (heart failure with reduced ejection fraction) (HCC)   CKD (chronic kidney disease), stage IV (HCC)   Nausea & vomiting   Hypothyroidism   Type 2 diabetes mellitus with diabetic polyneuropathy, with long-term current use of insulin (HCC)   Abdominal pain   SIRS (systemic inflammatory response syndrome) (HCC)   Transaminitis   History of anemia due to chronic kidney disease   Paroxysmal atrial fibrillation (HCC)   Bacteremia   Sepsis without acute organ dysfunction (HCC)  #1 acute gallstone pancreatitis -Patient presented with epigastric abdominal pain, nausea vomiting, lipase levels noted to be elevated at 1341. -Abdominal ultrasound done with gallstones with prominent CBD, no cholecystitis noted.   -Patient seen in consultation by GI, underwent MRCP which showed choledocholithiasis with mild pancreatitis 2 mm stone in the dependent portion of the distal common bile duct and gentle tapering of the duct at the level of the ampulla. -Patient subsequently underwent ERCP on 02/03/2022 consistent with choledocholithiasis, patient underwent biliary sphincterotomy and balloon extraction. -Patient seen in consultation by general surgery with no plans for laparoscopic cholecystectomy during this hospitalization as patient noted not to have an acute infection noted to be high surgical risk for cardiac complication from cardiac standpoint with CKD. -Resume home regimen ursodiol. -Continue IV antibiotics for bacteremia. -ID following, GI  following, general surgery following. -Diet advanced per general surgery.  2.  Sepsis with Enterococcus bacteremia, POA -On presentation patient noted to have a leukocytosis white count of 20.1, interval increase compared to 2 days ago, mild tachycardia. -Blood cultures obtained positive for Enterococcus with repeat blood cultures pending with no growth to date. -Patient seen by ID, initial recommendations was to come continue ampicillin and follow-up on TEE. -TEE done per cardiology 02/04/2022 negative for endocarditis. -IV antibiotics switched to IV vancomycin per ID and subsequently placed on IV daptomycin, who are following. -Per ID patient will need PICC line placed if repeat blood cultures are negative x72 hours and will need a 2-week course of IV antibiotics from negative culture. -Patient with CKD stage IV, discussed with primary nephrologist, Dr. Hollie Salk who strongly discouraged use placement of PICC line on this patient as patient will likely be on hemodialysis in the near future and recommending single-lumen tunneled central line if long-term IV antibiotics are needed. -Due to restrictions with PICC placement, ID recommending initiation of  linezolid on discharge for 2 more weeks from negative culture with end of therapy 02/16/2022. -Continue IV daptomycin while in-house. -Will need outpatient follow-up with ID. -Per ID.  3.  Chronic biventricular systolic heart failure -Diuretics on hold while in-house. -Due to CKD stage IV noted not on ACE inhibitor or ARB. -Patient with some diffuse scattered crackles on 02/05/2022 which has subsequently improved patient currently euvolemic on examination. -IV fluids stopped. -We will resume half home dose Demadex today, and increase to full home dose regimen tomorrow.  Likely resume metolazone on discharge.  -Cardiology was following but have signed off.   4.  Poorly controlled type 2 diabetes mellitus -Most  recent hemoglobin A1c > 15.5  (01/20/2022) -CBG of 127 this morning. -Continue Levemir to 15 units twice daily.  SSI. -Patient noted to be on Januvia prior to admission however given presentation of pancreatitis will likely not resume on discharge. -Diabetes coordinator following. -Outpatient follow-up with PCP/endocrinologist. -  5.  Hypothyroidism -Synthroid.  6.  Dysuria/?  UTI -Patient complains of some dysuria and suprapubic abdominal pain. -Urinalysis done concerning for UTI. -Urine cultures pending. -Status post 3 days IV Rocephin.   -Patient currently on daptomycin.    7.  CKD stage IV -Stable. -Repeat labs in the AM.  8.  Anemia of chronic kidney disease -Hemoglobin stable at 9.4  9.  Hyperlipidemia -Continue to hold statin and resume on discharge.    10.  Paroxysmal atrial fibrillation, CHA2DS2VASC score 6 -Continue amiodarone for rate control. -Patient cleared by general surgery for resumption of Eliquis which was started 02/05/2022.    DVT prophylaxis: Eliquis Code Status: Full Family Communication:  Disposition: Likely home when clinically improved, tolerating current diet, and cleared by GI, general surgery and ID, hopefully in the next 1 to 2 days.  Status is: Inpatient Remains inpatient appropriate because: Severity of illness   Consultants:  Gastroenterology: Dr. Silverio Decamp 02/01/2022 ID: Dr. Tommy Medal 02/02/2022 General surgery: Dr. Rosendo Gros 02/04/2022  Procedures:  Chest x-ray 02/01/2022 ERCP 02/03/2022 per Dr. Benson Norway TEE 02/04/2022 per Dr. Haroldine Laws MRCP 02/01/2022 Renal ultrasound 02/02/2022 Right upper quadrant ultrasound 02/01/2022 2D echo 02/02/2022 Bilateral upper extremity Dopplers 01/30/2022  Antimicrobials:  IV ampicillin 02/01/2022>>> 02/02/2022 IV vancomycin 02/02/2022 >>>> 02/05/2022 IV Rocephin 02/05/2022>>>> 02/08/2022 IV daptomycin 02/06/2022>>>>> 02/16/2022   Subjective: Sitting up in chair.  Overall feeling better.  Denies any chest pain.  No shortness of breath.  No abdominal  pain.  No emesis.  Nausea improving.  Tolerating current diet.  States she is peeing a lot.    Objective: Vitals:   02/06/22 2324 02/07/22 0343 02/07/22 0605 02/07/22 0753  BP: (!) 141/50 (!) 131/49    Pulse: 73 69 79   Resp: '15 12 15   '$ Temp: 98.2 F (36.8 C) 97.6 F (36.4 C)  98.2 F (36.8 C)  TempSrc: Oral Oral  Oral  SpO2: 93% 99% 98%   Weight:   109.2 kg   Height:        Intake/Output Summary (Last 24 hours) at 02/07/2022 1124 Last data filed at 02/07/2022 0900 Gross per 24 hour  Intake 240 ml  Output 1350 ml  Net -1110 ml    Filed Weights   02/04/22 0346 02/05/22 0400 02/07/22 0605  Weight: 108.3 kg 108.9 kg 109.2 kg    Examination:  General exam: NAD. Respiratory system: Lungs clear to auscultation bilaterally.  No wheezes, no crackles, no rhonchi.  Fair air movement.  Speaking in full sentences.   Cardiovascular system: RRR no murmurs rubs or gallops.  No JVD.  No lower extremity edema.  Gastrointestinal system: Abdomen is soft, nontender, nondistended, positive bowel sounds.  No rebound.  No guarding.   Central nervous system: Alert and oriented. No focal neurological deficits. Extremities: Symmetric 5 x 5 power. Skin: No rashes, lesions or ulcers Psychiatry: Judgement and insight appear normal. Mood & affect appropriate.     Data Reviewed: I have personally reviewed following labs and imaging studies  CBC: Recent Labs  Lab 02/01/22 0615 02/01/22 1314 02/03/22 0600 02/04/22 0542 02/05/22 0600 02/06/22 0543 02/07/22 0813  WBC 25.5*   < > 6.1 3.7* 5.0 5.2 6.7  NEUTROABS 23.9*  --   --   --   --   --   --  HGB 9.7*   < > 9.1* 9.6* 9.1* 8.9* 9.4*  HCT 29.5*   < > 28.1* 30.3* 29.0* 27.8* 30.2*  MCV 84.3   < > 84.1 84.2 84.8 85.3 85.8  PLT 226   < > 158 150 138* 123* 143*   < > = values in this interval not displayed.     Basic Metabolic Panel: Recent Labs  Lab 02/01/22 0615 02/01/22 1314 02/03/22 0600 02/04/22 0542 02/05/22 0600  02/06/22 0543 02/07/22 0813  NA 132*   < > 133* 130* 131* 133* 137  K 3.9   < > 4.0 5.1 5.0 4.5 4.6  CL 90*   < > 96* 94* 99 97* 100  CO2 26   < > '24 23 22 26 25  '$ GLUCOSE 246*   < > 149* 391* 299* 197* 110*  BUN 94*   < > 85* 84* 84* 82* 77*  CREATININE 3.93*   < > 3.71* 3.44* 3.19* 3.29* 3.12*  CALCIUM 8.5*   < > 8.3* 8.4* 8.4* 8.3* 8.6*  MG 1.8  --   --   --   --  1.9 2.2   < > = values in this interval not displayed.     GFR: Estimated Creatinine Clearance: 20.4 mL/min (A) (by C-G formula based on SCr of 3.12 mg/dL (H)).  Liver Function Tests: Recent Labs  Lab 02/01/22 0615 02/01/22 1314 02/02/22 0121 02/03/22 0600 02/04/22 0542  AST 114* 90* 56* 27 23  ALT 61* 56* 44 33 31  ALKPHOS 273* 300* 237* 205* 255*  BILITOT 1.0 0.7 0.9 0.9 1.0  PROT 5.4* 5.6* 5.3* 5.2* 5.7*  ALBUMIN 2.4* 2.3* 2.2* 2.1* 2.3*     CBG: Recent Labs  Lab 02/06/22 1108 02/06/22 1734 02/06/22 2119 02/06/22 2321 02/07/22 0619  GLUCAP 134* 251* 282* 251* 115*      Recent Results (from the past 240 hour(s))  Blood culture (routine x 2)     Status: Abnormal   Collection Time: 02/01/22  5:00 AM   Specimen: BLOOD RIGHT HAND  Result Value Ref Range Status   Specimen Description BLOOD RIGHT HAND  Final   Special Requests   Final    BOTTLES DRAWN AEROBIC AND ANAEROBIC Blood Culture results may not be optimal due to an inadequate volume of blood received in culture bottles   Culture  Setup Time   Final    GRAM POSITIVE COCCI AEROBIC BOTTLE ONLY CRITICAL VALUE NOTED.  VALUE IS CONSISTENT WITH PREVIOUSLY REPORTED AND CALLED VALUE.    Culture (A)  Final    ENTEROCOCCUS FAECIUM SUSCEPTIBILITIES PERFORMED ON PREVIOUS CULTURE WITHIN THE LAST 5 DAYS. Performed at [facility], [place] [address], [place], [place] [address]    Report Status 02/04/2022 FINAL  Final  Blood culture (routine x 2)     Status: Abnormal   Collection Time: 02/01/22  5:00 AM   Specimen: BLOOD RIGHT ARM  Result Value  Ref Range Status   Specimen Description BLOOD RIGHT ARM  Final   Special Requests   Final    BOTTLES DRAWN AEROBIC AND ANAEROBIC Blood Culture results may not be optimal due to an inadequate volume of blood received in culture bottles   Culture  Setup Time   Final    GRAM POSITIVE COCCI IN CHAINS IN BOTH AEROBIC AND ANAEROBIC BOTTLES CRITICAL RESULT CALLED TO, READ BACK BY AND VERIFIED WITH:  C/ PHARMD S. WATSON 02/01/22 0924 A. LAFRANCE  Performed at [facility],  [address], [place], [place] [address]    Culture ENTEROCOCCUS FAECIUM ENTEROCOCCUS FAECALIS  (A)  Final   Report Status 02/04/2022 FINAL  Final   Organism ID, Bacteria ENTEROCOCCUS FAECIUM  Final   Organism ID, Bacteria ENTEROCOCCUS FAECALIS  Final      Susceptibility   Enterococcus faecalis - MIC*    AMPICILLIN <=2 SENSITIVE Sensitive     VANCOMYCIN 1 SENSITIVE Sensitive     GENTAMICIN SYNERGY SENSITIVE Sensitive     * ENTEROCOCCUS FAECALIS   Enterococcus faecium - MIC*    AMPICILLIN >=32 RESISTANT Resistant     VANCOMYCIN <=0.5 SENSITIVE Sensitive     GENTAMICIN SYNERGY SENSITIVE Sensitive     * ENTEROCOCCUS FAECIUM  Blood Culture ID Panel (Reflexed)     Status: Abnormal   Collection Time: 02/01/22  5:00 AM  Result Value Ref Range Status   Enterococcus faecalis DETECTED (A) NOT DETECTED Corrected    Comment: CRITICAL RESULT CALLED TO, READ BACK BY AND VERIFIED WITH:  C/ PHARMD S. WATSON 02/01/22 2124 A. LAFRANCE  CORRECTED ON 09/13 AT 0129: PREVIOUSLY REPORTED AS DETECTED CRITICAL RESULT CALLED TO, READ BACK BY AND VERIFIED WITH:  C/ PHARMD S. WATSON 02/01/22 0924 A. LAFRANCE     Enterococcus Faecium DETECTED (A) NOT DETECTED Corrected    Comment: CRITICAL RESULT CALLED TO, READ BACK BY AND VERIFIED WITH:  C/ PHARMD S. WATSON 02/01/22 2124 A. LAFRANCE  CORRECTED ON 09/13 AT 0129: PREVIOUSLY REPORTED AS DETECTED CRITICAL RESULT CALLED TO, READ BACK BY AND VERIFIED WITH:  C/ PHARMD S. WATSON 02/01/22 0924  A. LAFRANCE     Listeria monocytogenes NOT DETECTED NOT DETECTED Final   Staphylococcus species NOT DETECTED NOT DETECTED Final   Staphylococcus aureus (BCID) NOT DETECTED NOT DETECTED Final   Staphylococcus epidermidis NOT DETECTED NOT DETECTED Final   Staphylococcus lugdunensis NOT DETECTED NOT DETECTED Final   Streptococcus species NOT DETECTED NOT DETECTED Final   Streptococcus agalactiae NOT DETECTED NOT DETECTED Final   Streptococcus pneumoniae NOT DETECTED NOT DETECTED Final   Streptococcus pyogenes NOT DETECTED NOT DETECTED Final   A.calcoaceticus-baumannii NOT DETECTED NOT DETECTED Final   Bacteroides fragilis NOT DETECTED NOT DETECTED Final   Enterobacterales NOT DETECTED NOT DETECTED Final   Enterobacter cloacae complex NOT DETECTED NOT DETECTED Final   Escherichia coli NOT DETECTED NOT DETECTED Final   Klebsiella aerogenes NOT DETECTED NOT DETECTED Final   Klebsiella oxytoca NOT DETECTED NOT DETECTED Final   Klebsiella pneumoniae NOT DETECTED NOT DETECTED Final   Proteus species NOT DETECTED NOT DETECTED Final   Salmonella species NOT DETECTED NOT DETECTED Final   Serratia marcescens NOT DETECTED NOT DETECTED Final   Haemophilus influenzae NOT DETECTED NOT DETECTED Final   Neisseria meningitidis NOT DETECTED NOT DETECTED Final   Pseudomonas aeruginosa NOT DETECTED NOT DETECTED Final   Stenotrophomonas maltophilia NOT DETECTED NOT DETECTED Final   Candida albicans NOT DETECTED NOT DETECTED Final   Candida auris NOT DETECTED NOT DETECTED Final   Candida glabrata NOT DETECTED NOT DETECTED Final   Candida krusei NOT DETECTED NOT DETECTED Final   Candida parapsilosis NOT DETECTED NOT DETECTED Final   Candida tropicalis NOT DETECTED NOT DETECTED Final   Cryptococcus neoformans/gattii NOT DETECTED NOT DETECTED Final   Vancomycin resistance NOT DETECTED NOT DETECTED Final    Comment: Performed at [facility], [address] [place], [place] [address]  Culture, blood  (Routine X 2) w Reflex to ID Panel     Status: None (Preliminary  result)   Collection Time: 02/03/22  9:39 AM   Specimen: BLOOD RIGHT WRIST  Result Value Ref Range Status   Specimen Description BLOOD RIGHT WRIST  Final   Special Requests   Final    BOTTLES DRAWN AEROBIC AND ANAEROBIC Blood Culture adequate volume   Culture   Final    NO GROWTH 4 DAYS Performed at [facility], [address] [place], [place] [address]    Report Status PENDING  Incomplete  Culture, blood (Routine X 2) w Reflex to ID Panel     Status: None (Preliminary result)   Collection Time: 02/03/22  9:45 AM   Specimen: BLOOD RIGHT FOREARM  Result Value Ref Range Status   Specimen Description BLOOD RIGHT FOREARM  Final   Special Requests   Final    BOTTLES DRAWN AEROBIC AND ANAEROBIC Blood Culture adequate volume   Culture   Final    NO GROWTH 4 DAYS Performed at [facility], [place] [address], [place], [place] [address]    Report Status PENDING  Incomplete         Radiology Studies: [name] EKG SITE RITE  Result Date: 02/06/2022 If Site Rite image not attached, placement could not be confirmed due to current cardiac rhythm.       Scheduled Meds:  amiodarone  200 mg Oral Daily   apixaban  5 mg Oral BID   bisacodyl  10 mg Rectal Once   carvedilol  6.25 mg Oral BID WC   insulin aspart  0-9 Units Subcutaneous Q6H   insulin detemir  15 Units Subcutaneous BID   levothyroxine  50 mcg Oral QAC breakfast   polyethylene glycol  17 g Oral BID   senna-docusate  1 tablet Oral BID   sodium chloride flush  3 mL Intravenous Q12H   Continuous Infusions:  sodium chloride     cefTRIAXone (ROCEPHIN)  IV 2 g (02/06/22 2150)   DAPTOmycin (CUBICIN) 800 mg in sodium chloride 0.9 % IVPB 800 mg (02/06/22 2358)     LOS: 6 days    Time spent: 40 minutes    Irine Seal, MD [facility]   To contact the attending provider between 7A-7P or the covering provider during after hours 7P-7A, please  log into the web site [email or web address] and access using universal [place] password for that web site. If you do not have the password, please call the hospital operator.  02/07/2022, 11:24 AM

## 2022-02-07 NOTE — TOC Initial Note (Signed)
Transition of Care [facility]) - Initial/Assessment Note    Patient Details  Name: Susan [name] MRN: [id number] Date of Birth: Jul 08, 1950  Transition of Care [facility]) CM/SW Contact:    Angelita Ingles, RN Phone [phone]  02/07/2022, 3:17 PM  Clinical Narrative:                 TOC following for patient with high risk for readmission. Patient reports that she is from home where she she lives with her husband and her disabled son. Patient states that she manages at home with some assistance from husband. PCP is Dr. [name] at [facility]. [facility] [address]. Patient states that she does have transportation to appointments and tp pick up medications. Patient states that she does follow up with her PCP and is compliant with her medications but does have some concern about her Eliquis because the copay is $400 which is not affordable for the patient. CM has requested  benefits check for affordability of Eliquis. Patient is currently active with [facility] for home health services and wishes to resume at discharge. TOC will continue to follow for disposition needs.   Expected Discharge Plan: [place] Barriers to Discharge: Continued Medical Work up   Patient Goals and CMS Choice Patient states their goals for this hospitalization and ongoing recovery are:: Wants to get better to go home CMS Medicare.gov Compare Post Acute Care list provided to:: Patient Choice offered to / list presented to : Patient  Expected Discharge Plan and Services Expected Discharge Plan: [place] In-house Referral: NA Discharge Planning Services: CM Consult Post Acute Care Choice: [place] arrangements for the past 2 months: Single Family Home                 DME Arranged: N/A DME Agency: NA                  Prior Living Arrangements/Services Living arrangements for the past 2 months: Single Family Home Lives  with:: Spouse, Adult Children Patient language and need for interpreter reviewed:: Yes Do you feel safe going back to the place where you live?: Yes      Need for Family Participation in Patient Care: Yes (Comment) Care giver support system in place?: Yes (comment) Current home services: DME, Home PT, Home OT Criminal Activity/Legal Involvement Pertinent to Current Situation/Hospitalization: No - Comment as needed  Activities of Daily Living      Permission Sought/Granted Permission sought to share information with : Family Supports Permission granted to share information with : No              Emotional Assessment Appearance:: Appears stated age Attitude/Demeanor/Rapport: Engaged Affect (typically observed): Accepting, Pleasant Orientation: : Oriented to Self, Oriented to Place, Oriented to  Time, Oriented to Situation Alcohol / Substance Use: Not Applicable Psych Involvement: No (comment)  Admission diagnosis:  Acute pancreatitis [K85.90] Acute pancreatitis, unspecified complication status, unspecified pancreatitis type [K85.90] Patient Active Problem List   Diagnosis Date Noted   Sepsis without acute organ dysfunction ([place])    Bacteremia    Acute pancreatitis 02/01/2022   Abdominal pain 02/01/2022   SIRS (systemic inflammatory response syndrome) (HCC) 02/01/2022   Transaminitis 02/01/2022   History of anemia due to chronic kidney disease 02/01/2022   Paroxysmal atrial fibrillation ([place]) 02/01/2022   DKA (diabetic ketoacidosis) ([place]) 01/14/2022   NSTEMI (non-ST elevated myocardial infarction) ([place]) 03/05/2021  Acute renal failure superimposed on stage 4 chronic kidney disease ([place]) 08/22/2020   Hypoalbuminemia 05/25/2020   GERD (gastroesophageal reflux disease) 05/25/2020   Pressure injury of skin 05/17/2020   Type 2 diabetes mellitus with diabetic polyneuropathy, with long-term current use of insulin ([place]) 03/07/2020   Obesity, Class III, BMI 40-49.9 (morbid  obesity) ([place]) 03/07/2020   Common bile duct (CBD) obstruction 05/28/2019   Benign neoplasm of ascending colon    Benign neoplasm of transverse colon    Benign neoplasm of descending colon    Benign neoplasm of sigmoid colon    Gastric polyps    Hyperkalemia 03/11/2019   Prolonged QT interval 03/11/2019   Onychomycosis 06/21/2018   Osteomyelitis of second toe of right foot ([place])    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 (HCC)    PVD (peripheral vascular disease) ([place]) 10/26/2017   E-coli UTI 07/27/2017   Hypothyroidism 07/27/2017   PAH (pulmonary artery hypertension) (HCC)    Impaired ambulation 07/19/2017   Nausea & vomiting 07/15/2017   Leg cramps 02/27/2017   Peripheral edema 01/12/2017   Diabetic neuropathy ([place]) 11/12/2016   CKD (chronic kidney disease), stage IV ([place]) 10/24/2015   Anemia 10/03/2015   Generalized anxiety disorder 10/03/2015   Insomnia 10/03/2015   Hyperglycemia due to diabetes mellitus ([place]) 06/07/2015   Chronic diastolic CHF (congestive heart failure) ([place]) 06/07/2015   Non compliance with medical treatment 04/17/2014   Rotator cuff tear 03/14/2014   Class 3 obesity ([place]) 09/23/2013   Chronic HFrEF (heart failure with reduced ejection fraction) ([place]) 06/03/2013   Hypotension 12/25/2012   Urinary incontinence    MDD (major depressive disorder) 11/12/2010   RBBB (right bundle branch block)    Coronary artery disease    Hyperlipemia 01/22/2009   Essential hypertension 01/22/2009   PCP:  [name], MD Pharmacy:   [id number] [facility] [address]Phone: [phone]: [phone] [facility] [address]: [phone]: [phone]    Social Determinants of Health (SDOH) Interventions    Readmission Risk Interventions    02/07/2022    2:57 PM 01/23/2022    12:38 PM 05/28/2020   12:50 PM  Readmission Risk Prevention Plan  Transportation Screening Complete Complete Complete  PCP or Specialist Appt within 3-5 Days  Complete   HRI or [place] Complete   Social Work Consult for [place]anning/Counseling  Complete   Palliative Care Screening  Not Applicable   Medication Review (Press photographer Complete Complete Complete  PCP or Specialist appointment within 3-5 days of discharge Complete  Complete  HRI or Home Care Consult Complete  Complete  SW Recovery Care/Counseling Consult Complete  Complete  Palliative Care Screening Not Applicable  Not AClarionNot Applicable  Not Applicable

## 2022-02-08 DIAGNOSIS — I5022 Chronic systolic (congestive) heart failure: Secondary | ICD-10-CM | POA: Diagnosis not present

## 2022-02-08 DIAGNOSIS — R7881 Bacteremia: Secondary | ICD-10-CM | POA: Diagnosis not present

## 2022-02-08 DIAGNOSIS — K851 Biliary acute pancreatitis without necrosis or infection: Secondary | ICD-10-CM | POA: Diagnosis not present

## 2022-02-08 DIAGNOSIS — N184 Chronic kidney disease, stage 4 (severe): Secondary | ICD-10-CM | POA: Diagnosis not present

## 2022-02-08 LAB — CBC
HCT: 28.2 % — ABNORMAL LOW (ref 36.0–46.0)
Hemoglobin: 8.9 g/dL — ABNORMAL LOW (ref 12.0–15.0)
MCH: 27.4 pg (ref 26.0–34.0)
MCHC: 31.6 g/dL (ref 30.0–36.0)
MCV: 86.8 fL (ref 80.0–100.0)
Platelets: 140 10*3/uL — ABNORMAL LOW (ref 150–400)
RBC: 3.25 MIL/uL — ABNORMAL LOW (ref 3.87–5.11)
RDW: 16.2 % — ABNORMAL HIGH (ref 11.5–15.5)
WBC: 4.9 10*3/uL (ref 4.0–10.5)
nRBC: 0 % (ref 0.0–0.2)

## 2022-02-08 LAB — CULTURE, BLOOD (ROUTINE X 2)
Culture: NO GROWTH
Culture: NO GROWTH
Special Requests: ADEQUATE
Special Requests: ADEQUATE

## 2022-02-08 LAB — BASIC METABOLIC PANEL
Anion gap: 10 (ref 5–15)
BUN: 75 mg/dL — ABNORMAL HIGH (ref 8–23)
CO2: 26 mmol/L (ref 22–32)
Calcium: 8.6 mg/dL — ABNORMAL LOW (ref 8.9–10.3)
Chloride: 99 mmol/L (ref 98–111)
Creatinine, Ser: 3.06 mg/dL — ABNORMAL HIGH (ref 0.44–1.00)
GFR, Estimated: 16 mL/min — ABNORMAL LOW (ref >60)
Glucose, Bld: 156 mg/dL — ABNORMAL HIGH (ref 70–99)
Potassium: 4.5 mmol/L (ref 3.5–5.1)
Sodium: 135 mmol/L (ref 135–145)

## 2022-02-08 LAB — GLUCOSE, CAPILLARY
Glucose-Capillary: 189 mg/dL — ABNORMAL HIGH (ref 70–99)
Glucose-Capillary: 418 mg/dL — ABNORMAL HIGH (ref 70–99)

## 2022-02-08 MED ORDER — INSULIN ASPART 100 UNIT/ML IJ SOLN
0.0000 [IU] | Freq: Three times a day (TID) | INTRAMUSCULAR | Status: DC
  Administered 2022-02-08: 9 [IU] via SUBCUTANEOUS
  Administered 2022-02-09: 5 [IU] via SUBCUTANEOUS
  Administered 2022-02-09: 3 [IU] via SUBCUTANEOUS
  Administered 2022-02-09: 1 [IU] via SUBCUTANEOUS
  Administered 2022-02-09: 3 [IU] via SUBCUTANEOUS
  Administered 2022-02-10 (×2): 7 [IU] via SUBCUTANEOUS
  Administered 2022-02-10: 5 [IU] via SUBCUTANEOUS
  Administered 2022-02-10: 2 [IU] via SUBCUTANEOUS
  Administered 2022-02-11: 3 [IU] via SUBCUTANEOUS
  Administered 2022-02-11: 2 [IU] via SUBCUTANEOUS
  Administered 2022-02-11: 1 [IU] via SUBCUTANEOUS

## 2022-02-08 NOTE — Progress Notes (Signed)
Mobility Specialist Progress Note    02/08/22 1343  Mobility  Activity Ambulated with assistance in hallway  Level of Assistance +2 (takes two people) (chair follow)  Assistive Device Front wheel walker  Distance Ambulated (ft) 195 ft (70+55+70)  Activity Response Tolerated well  $Mobility charge 1 Mobility   Pre-Mobility: 77 HR, 91% SpO2 on 2LO2 During Mobility: 83 HR, 95% SpO2 on 2LO2 Post-Mobility: 76 HR, 98% SpO2 on RA  Pt received in chair and agreeable. C/o headache. Took x2 seated rest breaks. Returned in chair and left with call bell in reach. RN advised to leave on RA.   Hildred Alamin Mobility Specialist

## 2022-02-08 NOTE — Progress Notes (Signed)
PROGRESS NOTE    Susan [name]  QQV:[id number] DOB: 1951/02/02 DOA: 01/31/2022 PCP: [name], MD    Chief Complaint  Patient presents with   Abdominal Pain    Emesis/Diarrhea    Brief Narrative:  32IR with hx systolic chf, DM, CKD 4, afib on eliquis presented with epigastric pain with n/v admitted for acute gallstone pancreatitis.   Assessment & Plan:   Principal Problem:   Acute pancreatitis Active Problems:   Hyperlipemia   Chronic HFrEF (heart failure with reduced ejection fraction) (HCC)   CKD (chronic kidney disease), stage IV (HCC)   Nausea & vomiting   Hypothyroidism   Type 2 diabetes mellitus with diabetic polyneuropathy, with long-term current use of insulin (HCC)   Abdominal pain   SIRS (systemic inflammatory response syndrome) (HCC)   Transaminitis   History of anemia due to chronic kidney disease   Paroxysmal atrial fibrillation (HCC)   Bacteremia   Sepsis without acute organ dysfunction (HCC)  #1 acute gallstone pancreatitis -Patient presented with epigastric abdominal pain, nausea vomiting, lipase levels noted to be elevated at 1341. -Abdominal ultrasound done with gallstones with prominent CBD, no cholecystitis noted.   -Patient seen in consultation by GI, underwent MRCP which showed choledocholithiasis with mild pancreatitis 2 mm stone in the dependent portion of the distal common bile duct and gentle tapering of the duct at the level of the ampulla. -Patient subsequently underwent ERCP on 02/03/2022 consistent with choledocholithiasis, patient underwent biliary sphincterotomy and balloon extraction. -Patient seen in consultation by general surgery with no plans for laparoscopic cholecystectomy during this hospitalization as patient noted not to have an acute infection noted to be high surgical risk for cardiac complication from cardiac standpoint with CKD. -Resume home regimen ursodiol. -Continue IV antibiotics for bacteremia. -ID following, GI  following, general surgery following. -Diet advanced per general surgery.  2.  Sepsis with Enterococcus bacteremia, POA -On presentation patient noted to have a leukocytosis white count of 20.1, interval increase compared to 2 days ago, mild tachycardia. -Blood cultures obtained positive for Enterococcus with repeat blood cultures pending with no growth to date. -Patient seen by ID, initial recommendations was to come continue ampicillin and follow-up on TEE. -TEE done per cardiology 02/04/2022 negative for endocarditis. -IV antibiotics switched to IV vancomycin per ID and subsequently placed on IV daptomycin, who are following. -Per ID patient will need PICC line placed if repeat blood cultures are negative x72 hours and will need a 2-week course of IV antibiotics from negative culture. -Patient with CKD stage IV, discussed with primary nephrologist, Dr. Hollie Salk who strongly discouraged use placement of PICC line on this patient as patient will likely be on hemodialysis in the near future and recommending single-lumen tunneled central line if long-term IV antibiotics are needed. -Due to restrictions with PICC placement, secondary to CKD, ID recommending initiation of  linezolid on discharge for 2 more weeks from negative culture with end of therapy 02/16/2022. -Continue IV daptomycin while in-house. -Will need outpatient follow-up with ID. -Per ID.  3.  Chronic biventricular systolic heart failure -Diuretics initially were on hold.  -Due to CKD stage IV noted not on ACE inhibitor or ARB. -Patient with some diffuse scattered crackles on 02/05/2022 which has subsequently improved patient currently euvolemic on examination. -IV fluids stopped. -Demadex resumed at 40 mg daily, continue current dose and increase back to home dose in the next 1 to 2 days.   -Resume metolazone on discharge.   -Cardiology was following but have signed off.  4.  Poorly controlled type 2 diabetes mellitus -Most recent  hemoglobin A1c > 15.5 (01/20/2022) -CBG of 189 this morning. -Continue Levemir to 15 units twice daily.  SSI. -Patient noted to be on Januvia prior to admission however given presentation of pancreatitis will likely not resume on discharge. -Diabetes coordinator following. -Outpatient follow-up with PCP/endocrinologist. -  5.  Hypothyroidism -Synthroid.  6.  Dysuria/?  UTI -Patient complains of some dysuria and suprapubic abdominal pain. -Urinalysis done concerning for UTI. -Urine cultures pending. -Status post 3 days IV Rocephin.   -Patient currently on daptomycin.    7.  CKD stage IV -Stable. -Repeat labs in the AM.  8.  Anemia of chronic kidney disease -Hemoglobin stable at 8.9.  9.  Hyperlipidemia -Statin on hold, resume on discharge.    10.  Paroxysmal atrial fibrillation, CHA2DS2VASC score 6 -Amiodarone for rate control.   -Patient cleared by general surgery for resumption of Eliquis which was started 02/05/2022.    DVT prophylaxis: Eliquis Code Status: Full Family Communication:  Disposition: Likely home when clinically improved, tolerating current diet, and cleared by GI, general surgery and ID, hopefully in the next 1 to 2 days.  Status is: Inpatient Remains inpatient appropriate because: Severity of illness   Consultants:  Gastroenterology: Dr. Silverio Decamp 02/01/2022 ID: Dr. Tommy Medal 02/02/2022 General surgery: Dr. Rosendo Gros 02/04/2022  Procedures:  Chest x-ray 02/01/2022 ERCP 02/03/2022 per Dr. Benson Norway TEE 02/04/2022 per Dr. Haroldine Laws MRCP 02/01/2022 Renal ultrasound 02/02/2022 Right upper quadrant ultrasound 02/01/2022 2D echo 02/02/2022 Bilateral upper extremity Dopplers 01/30/2022  Antimicrobials:  IV ampicillin 02/01/2022>>> 02/02/2022 IV vancomycin 02/02/2022 >>>> 02/05/2022 IV Rocephin 02/05/2022>>>> 02/08/2022 IV daptomycin 02/06/2022>>>>> 02/16/2022   Subjective: Sitting up in bed.  Complains of nausea.  No emesis yet per patient.  No chest pain.  No shortness of  breath.  Does not feel too well today.   Objective: Vitals:   02/07/22 2325 02/08/22 0541 02/08/22 0741 02/08/22 1043  BP: (!) 134/58 (!) 125/56 (!) 107/56   Pulse: 71 68 71   Resp: '20 11 13   '$ Temp: 98.2 F (36.8 C) 98.1 F (36.7 C) 98.1 F (36.7 C) 97.7 F (36.5 C)  TempSrc: Oral Oral Oral Oral  SpO2: 99% 100% 94%   Weight:      Height:        Intake/Output Summary (Last 24 hours) at 02/08/2022 1128 Last data filed at 02/08/2022 0700 Gross per 24 hour  Intake 959.46 ml  Output 600 ml  Net 359.46 ml    Filed Weights   02/04/22 0346 02/05/22 0400 02/07/22 0605  Weight: 108.3 kg 108.9 kg 109.2 kg    Examination:  General exam: No acute distress. Respiratory system: CTA B.  No wheezes, no crackles, no rhonchi.  Fair air movement.  Speaking in full sentences.  Cardiovascular system: Regular rate rhythm no murmurs rubs or gallops.  No JVD.  No lower extremity edema.  Gastrointestinal system: Abdomen is soft, nontender, nondistended, positive bowel sounds.  No rebound.  No guarding.   Central nervous system: Alert and oriented. No focal neurological deficits. Extremities: Symmetric 5 x 5 power. Skin: No rashes, lesions or ulcers Psychiatry: Judgement and insight appear normal. Mood & affect appropriate.     Data Reviewed: I have personally reviewed following labs and imaging studies  CBC: Recent Labs  Lab 02/04/22 0542 02/05/22 0600 02/06/22 0543 02/07/22 0813 02/08/22 0438  WBC 3.7* 5.0 5.2 6.7 4.9  HGB 9.6* 9.1* 8.9* 9.4* 8.9*  HCT 30.3* 29.0* 27.8* 30.2*  28.2*  MCV 84.2 84.8 85.3 85.8 86.8  PLT 150 138* 123* 143* 140*     Basic Metabolic Panel: Recent Labs  Lab 02/04/22 0542 02/05/22 0600 02/06/22 0543 02/07/22 0813 02/08/22 0438  NA 130* 131* 133* 137 135  K 5.1 5.0 4.5 4.6 4.5  CL 94* 99 97* 100 99  CO2 '23 22 26 25 26  '$ GLUCOSE 391* 299* 197* 110* 156*  BUN 84* 84* 82* 77* 75*  CREATININE 3.44* 3.19* 3.29* 3.12* 3.06*  CALCIUM 8.4* 8.4* 8.3*  8.6* 8.6*  MG  --   --  1.9 2.2  --      GFR: Estimated Creatinine Clearance: 20.8 mL/min (A) (by C-G formula based on SCr of 3.06 mg/dL (H)).  Liver Function Tests: Recent Labs  Lab 02/01/22 1314 02/02/22 0121 02/03/22 0600 02/04/22 0542  AST 90* 56* 27 23  ALT 56* 44 33 31  ALKPHOS 300* 237* 205* 255*  BILITOT 0.7 0.9 0.9 1.0  PROT 5.6* 5.3* 5.2* 5.7*  ALBUMIN 2.3* 2.2* 2.1* 2.3*     CBG: Recent Labs  Lab 02/06/22 2321 02/07/22 0619 02/07/22 1138 02/07/22 1728 02/08/22 1038  GLUCAP 251* 115* 127* 281* 189*      Recent Results (from the past 240 hour(s))  Blood culture (routine x 2)     Status: Abnormal   Collection Time: 02/01/22  5:00 AM   Specimen: BLOOD RIGHT HAND  Result Value Ref Range Status   Specimen Description BLOOD RIGHT HAND  Final   Special Requests   Final    BOTTLES DRAWN AEROBIC AND ANAEROBIC Blood Culture results may not be optimal due to an inadequate volume of blood received in culture bottles   Culture  Setup Time   Final    GRAM POSITIVE COCCI AEROBIC BOTTLE ONLY CRITICAL VALUE NOTED.  VALUE IS CONSISTENT WITH PREVIOUSLY REPORTED AND CALLED VALUE.    Culture (A)  Final    ENTEROCOCCUS FAECIUM SUSCEPTIBILITIES PERFORMED ON PREVIOUS CULTURE WITHIN THE LAST 5 DAYS. Performed at [facility], [place] [address] [place], [place] [address]    Report Status 02/04/2022 FINAL  Final  Blood culture (routine x 2)     Status: Abnormal   Collection Time: 02/01/22  5:00 AM   Specimen: BLOOD RIGHT ARM  Result Value Ref Range Status   Specimen Description BLOOD RIGHT ARM  Final   Special Requests   Final    BOTTLES DRAWN AEROBIC AND ANAEROBIC Blood Culture results may not be optimal due to an inadequate volume of blood received in culture bottles   Culture  Setup Time   Final    GRAM POSITIVE COCCI IN CHAINS IN BOTH AEROBIC AND ANAEROBIC BOTTLES CRITICAL RESULT CALLED TO, READ BACK BY AND VERIFIED WITH:  C/ PHARMD S. WATSON 02/01/22 0924 A.  LAFRANCE  Performed at [facility], [place] [address] [place], [place] [address]    Culture ENTEROCOCCUS FAECIUM ENTEROCOCCUS FAECALIS  (A)  Final   Report Status 02/04/2022 FINAL  Final   Organism ID, Bacteria ENTEROCOCCUS FAECIUM  Final   Organism ID, Bacteria ENTEROCOCCUS FAECALIS  Final      Susceptibility   Enterococcus faecalis - MIC*    AMPICILLIN <=2 SENSITIVE Sensitive     VANCOMYCIN 1 SENSITIVE Sensitive     GENTAMICIN SYNERGY SENSITIVE Sensitive     * ENTEROCOCCUS FAECALIS   Enterococcus faecium - MIC*    AMPICILLIN >=32 RESISTANT Resistant     VANCOMYCIN <=0.5 SENSITIVE Sensitive  GENTAMICIN SYNERGY SENSITIVE Sensitive     * ENTEROCOCCUS FAECIUM  Blood Culture ID Panel (Reflexed)     Status: Abnormal   Collection Time: 02/01/22  5:00 AM  Result Value Ref Range Status   Enterococcus faecalis DETECTED (A) NOT DETECTED Corrected    Comment: CRITICAL RESULT CALLED TO, READ BACK BY AND VERIFIED WITH:  C/ PHARMD S. WATSON 02/01/22 2124 A. LAFRANCE  CORRECTED ON 09/13 AT 0129: PREVIOUSLY REPORTED AS DETECTED CRITICAL RESULT CALLED TO, READ BACK BY AND VERIFIED WITH:  C/ PHARMD S. WATSON 02/01/22 0924 A. LAFRANCE     Enterococcus Faecium DETECTED (A) NOT DETECTED Corrected    Comment: CRITICAL RESULT CALLED TO, READ BACK BY AND VERIFIED WITH:  C/ PHARMD S. WATSON 02/01/22 2124 A. LAFRANCE  CORRECTED ON 09/13 AT 0129: PREVIOUSLY REPORTED AS DETECTED CRITICAL RESULT CALLED TO, READ BACK BY AND VERIFIED WITH:  C/ PHARMD S. WATSON 02/01/22 0924 A. LAFRANCE     Listeria monocytogenes NOT DETECTED NOT DETECTED Final   Staphylococcus species NOT DETECTED NOT DETECTED Final   Staphylococcus aureus (BCID) NOT DETECTED NOT DETECTED Final   Staphylococcus epidermidis NOT DETECTED NOT DETECTED Final   Staphylococcus lugdunensis NOT DETECTED NOT DETECTED Final   Streptococcus species NOT DETECTED NOT DETECTED Final   Streptococcus agalactiae NOT DETECTED NOT DETECTED Final    Streptococcus pneumoniae NOT DETECTED NOT DETECTED Final   Streptococcus pyogenes NOT DETECTED NOT DETECTED Final   A.calcoaceticus-baumannii NOT DETECTED NOT DETECTED Final   Bacteroides fragilis NOT DETECTED NOT DETECTED Final   Enterobacterales NOT DETECTED NOT DETECTED Final   Enterobacter cloacae complex NOT DETECTED NOT DETECTED Final   Escherichia coli NOT DETECTED NOT DETECTED Final   Klebsiella aerogenes NOT DETECTED NOT DETECTED Final   Klebsiella oxytoca NOT DETECTED NOT DETECTED Final   Klebsiella pneumoniae NOT DETECTED NOT DETECTED Final   Proteus species NOT DETECTED NOT DETECTED Final   Salmonella species NOT DETECTED NOT DETECTED Final   Serratia marcescens NOT DETECTED NOT DETECTED Final   Haemophilus influenzae NOT DETECTED NOT DETECTED Final   Neisseria meningitidis NOT DETECTED NOT DETECTED Final   Pseudomonas aeruginosa NOT DETECTED NOT DETECTED Final   Stenotrophomonas maltophilia NOT DETECTED NOT DETECTED Final   Candida albicans NOT DETECTED NOT DETECTED Final   Candida auris NOT DETECTED NOT DETECTED Final   Candida glabrata NOT DETECTED NOT DETECTED Final   Candida krusei NOT DETECTED NOT DETECTED Final   Candida parapsilosis NOT DETECTED NOT DETECTED Final   Candida tropicalis NOT DETECTED NOT DETECTED Final   Cryptococcus neoformans/gattii NOT DETECTED NOT DETECTED Final   Vancomycin resistance NOT DETECTED NOT DETECTED Final    Comment: Performed at [facility], [address], [place], [place] [address]  Culture, blood (Routine X 2) w Reflex to ID Panel     Status: None   Collection Time: 02/03/22  9:39 AM   Specimen: BLOOD RIGHT WRIST  Result Value Ref Range Status   Specimen Description BLOOD RIGHT WRIST  Final   Special Requests   Final    BOTTLES DRAWN AEROBIC AND ANAEROBIC Blood Culture adequate volume   Culture   Final    NO GROWTH 5 DAYS Performed at [facility], [place] [address], [place], [place] [address]    Report Status  02/08/2022 FINAL  Final  Culture, blood (Routine X 2) w Reflex to ID Panel     Status: None   Collection Time: 02/03/22  9:45 AM   Specimen: BLOOD RIGHT  FOREARM  Result Value Ref Range Status   Specimen Description BLOOD RIGHT FOREARM  Final   Special Requests   Final    BOTTLES DRAWN AEROBIC AND ANAEROBIC Blood Culture adequate volume   Culture   Final    NO GROWTH 5 DAYS Performed at [facility], [address], [place], [place] [address]    Report Status 02/08/2022 FINAL  Final         Radiology Studies: No results found.      Scheduled Meds:  amiodarone  200 mg Oral Daily   apixaban  5 mg Oral BID   aspirin EC  81 mg Oral Q breakfast   bisacodyl  10 mg Rectal Once   carvedilol  6.25 mg Oral BID WC   insulin aspart  0-9 Units Subcutaneous Q6H   insulin detemir  15 Units Subcutaneous BID   levothyroxine  50 mcg Oral QAC breakfast   magnesium oxide  400 mg Oral Daily   polyethylene glycol  17 g Oral BID   pregabalin  150 mg Oral BID   senna-docusate  1 tablet Oral BID   sodium chloride flush  3 mL Intravenous Q12H   torsemide  40 mg Oral Daily   ursodiol  600 mg Oral TID   Continuous Infusions:  sodium chloride     DAPTOmycin (CUBICIN) 800 mg in sodium chloride 0.9 % IVPB 800 mg (02/06/22 2358)     LOS: 7 days    Time spent: 40 minutes    Irine Seal, MD [facility]   To contact the attending provider between 7A-7P or the covering provider during after hours 7P-7A, please log into the web site [email or web address] and access using universal [place] password for that web site. If you do not have the password, please call the hospital operator.  02/08/2022, 11:28 AM

## 2022-02-08 NOTE — Progress Notes (Signed)
MD on call notified for a CBG of 418. SSI protocol states to notify MD and get obtain STAT lab verification.Orders given to give pt 11 units at this time and change insulin regimen to AC&HS   Susan [name] M

## 2022-02-09 DIAGNOSIS — K851 Biliary acute pancreatitis without necrosis or infection: Secondary | ICD-10-CM | POA: Diagnosis not present

## 2022-02-09 DIAGNOSIS — N184 Chronic kidney disease, stage 4 (severe): Secondary | ICD-10-CM | POA: Diagnosis not present

## 2022-02-09 DIAGNOSIS — I5022 Chronic systolic (congestive) heart failure: Secondary | ICD-10-CM | POA: Diagnosis not present

## 2022-02-09 DIAGNOSIS — R7881 Bacteremia: Secondary | ICD-10-CM | POA: Diagnosis not present

## 2022-02-09 LAB — CBC
HCT: 28.8 % — ABNORMAL LOW (ref 36.0–46.0)
Hemoglobin: 8.9 g/dL — ABNORMAL LOW (ref 12.0–15.0)
MCH: 26.6 pg (ref 26.0–34.0)
MCHC: 30.9 g/dL (ref 30.0–36.0)
MCV: 86.2 fL (ref 80.0–100.0)
Platelets: 152 10*3/uL (ref 150–400)
RBC: 3.34 MIL/uL — ABNORMAL LOW (ref 3.87–5.11)
RDW: 16.1 % — ABNORMAL HIGH (ref 11.5–15.5)
WBC: 5.6 10*3/uL (ref 4.0–10.5)
nRBC: 0 % (ref 0.0–0.2)

## 2022-02-09 LAB — BASIC METABOLIC PANEL
Anion gap: 8 (ref 5–15)
BUN: 71 mg/dL — ABNORMAL HIGH (ref 8–23)
CO2: 26 mmol/L (ref 22–32)
Calcium: 8.7 mg/dL — ABNORMAL LOW (ref 8.9–10.3)
Chloride: 101 mmol/L (ref 98–111)
Creatinine, Ser: 2.94 mg/dL — ABNORMAL HIGH (ref 0.44–1.00)
GFR, Estimated: 16 mL/min — ABNORMAL LOW (ref >60)
Glucose, Bld: 172 mg/dL — ABNORMAL HIGH (ref 70–99)
Potassium: 4.2 mmol/L (ref 3.5–5.1)
Sodium: 135 mmol/L (ref 135–145)

## 2022-02-09 LAB — GLUCOSE, CAPILLARY
Glucose-Capillary: 141 mg/dL — ABNORMAL HIGH (ref 70–99)
Glucose-Capillary: 205 mg/dL — ABNORMAL HIGH (ref 70–99)
Glucose-Capillary: 264 mg/dL — ABNORMAL HIGH (ref 70–99)
Glucose-Capillary: 230 mg/dL — ABNORMAL HIGH (ref 70–99)

## 2022-02-09 MED ORDER — TORSEMIDE 20 MG PO TABS: 40.0000 mg | ORAL_TABLET | Freq: Two times a day (BID) | ORAL | Status: DC

## 2022-02-09 MED ORDER — TORSEMIDE 20 MG PO TABS
80.0000 mg | ORAL_TABLET | Freq: Two times a day (BID) | ORAL | Status: DC
  Administered 2022-02-09 – 2022-02-11 (×5): 80 mg via ORAL
  Filled 2022-02-09 (×5): qty 4

## 2022-02-09 NOTE — Progress Notes (Signed)
PROGRESS NOTE    Susan [name]  XBD:[id number] DOB: 09/20/1950 DOA: 01/31/2022 PCP: [name], MD    Chief Complaint  Patient presents with   Abdominal Pain    Emesis/Diarrhea    Brief Narrative:  82MH with hx systolic chf, DM, CKD 4, afib on eliquis presented with epigastric pain with n/v admitted for acute gallstone pancreatitis.   Assessment & Plan:   Principal Problem:   Acute pancreatitis Active Problems:   Hyperlipemia   Chronic HFrEF (heart failure with reduced ejection fraction) (HCC)   CKD (chronic kidney disease), stage IV (HCC)   Nausea & vomiting   Hypothyroidism   Type 2 diabetes mellitus with diabetic polyneuropathy, with long-term current use of insulin (HCC)   Abdominal pain   SIRS (systemic inflammatory response syndrome) (HCC)   Transaminitis   History of anemia due to chronic kidney disease   Paroxysmal atrial fibrillation (HCC)   Bacteremia   Sepsis without acute organ dysfunction (HCC)  #1 acute gallstone pancreatitis -Patient presented with epigastric abdominal pain, nausea vomiting, lipase levels noted to be elevated at 1341. -Abdominal ultrasound done with gallstones with prominent CBD, no cholecystitis noted.   -Patient seen in consultation by GI, underwent MRCP which showed choledocholithiasis with mild pancreatitis 2 mm stone in the dependent portion of the distal common bile duct and gentle tapering of the duct at the level of the ampulla. -Patient subsequently underwent ERCP on 02/03/2022 consistent with choledocholithiasis, patient underwent biliary sphincterotomy and balloon extraction. -Patient seen in consultation by general surgery with no plans for laparoscopic cholecystectomy during this hospitalization as patient noted not to have an acute infection noted to be high surgical risk for cardiac complication from cardiac standpoint with CKD. -Continue home regimen ursodiol. -Continue IV antibiotics for bacteremia. -ID following, GI  following, general surgery following. -Diet advanced per general surgery.  2.  Sepsis with Enterococcus bacteremia, POA -On presentation patient noted to have a leukocytosis white count of 20.1, interval increase compared to 2 days ago, mild tachycardia. -Blood cultures obtained positive for Enterococcus with repeat blood cultures pending with no growth to date. -Patient seen by ID, initial recommendations was to come continue ampicillin and follow-up on TEE. -TEE done per cardiology 02/04/2022 negative for endocarditis. -IV antibiotics switched to IV vancomycin per ID and subsequently placed on IV daptomycin, who are following. -Per ID patient will need PICC line placed if repeat blood cultures are negative x72 hours and will need a 2-week course of IV antibiotics from negative culture. -Patient with CKD stage IV, discussed with primary nephrologist, Dr. Hollie Salk who strongly discouraged use placement of PICC line on this patient as patient will likely be on hemodialysis in the near future and recommending single-lumen tunneled central line if long-term IV antibiotics are needed. -Due to restrictions with PICC placement, secondary to CKD, ID recommending initiation of  linezolid on discharge for 2 more weeks from negative culture with end of therapy 02/16/2022. -Continue IV daptomycin while in-house. -Will need outpatient follow-up with ID. -Per ID.  3.  Chronic biventricular systolic heart failure -Diuretics initially were on hold.  -Due to CKD stage IV noted not on ACE inhibitor or ARB. -Patient with some diffuse scattered crackles on 02/05/2022 which has subsequently improved patient currently euvolemic on examination. -IV fluids stopped. -Demadex resumed at 40 mg daily. -Patient with some bibasilar crackles noted on examination and as such we will uptitrate Demadex back to home regimen of 80 twice daily.  -Resume metolazone on discharge.   -Cardiology  was following but have signed off.   4.   Poorly controlled type 2 diabetes mellitus -Most recent hemoglobin A1c > 15.5 (01/20/2022) -CBG of 141 this morning. -Continue Levemir to 15 units twice daily.  SSI. -Patient noted to be on Januvia prior to admission however given presentation of pancreatitis will likely not resume on discharge. -Diabetes coordinator following. -Outpatient follow-up with PCP/endocrinologist. -  5.  Hypothyroidism -Continue Synthroid.  6.  Dysuria/?  UTI -Patient complains of some dysuria and suprapubic abdominal pain. -Urinalysis done concerning for UTI. -Unsure as to whether urine cultures were actually sent as results are still pending. -Status post 3 days IV Rocephin.   -Patient currently on daptomycin.    7.  CKD stage IV -Stable. -Repeat labs in the AM.  8.  Anemia of chronic kidney disease -Hemoglobin stable at 8.9.  9.  Hyperlipidemia -Statin on hold, resume on discharge.    10.  Paroxysmal atrial fibrillation, CHA2DS2VASC score 6 -Amiodarone for rate control.   -Patient cleared by general surgery for resumption of Eliquis which was started 02/05/2022.    DVT prophylaxis: Eliquis Code Status: Full Family Communication:  Disposition: Likely home when clinically improved, tolerating current diet, and cleared by GI, general surgery and ID, hopefully in the next 1 to 2 days.  Status is: Inpatient Remains inpatient appropriate because: Severity of illness   Consultants:  Gastroenterology: Dr. Silverio Decamp 02/01/2022 ID: Dr. Tommy Medal 02/02/2022 General surgery: Dr. Rosendo Gros 02/04/2022  Procedures:  Chest x-ray 02/01/2022 ERCP 02/03/2022 per Dr. Benson Norway TEE 02/04/2022 per Dr. Haroldine Laws MRCP 02/01/2022 Renal ultrasound 02/02/2022 Right upper quadrant ultrasound 02/01/2022 2D echo 02/02/2022 Bilateral upper extremity Dopplers 01/30/2022  Antimicrobials:  IV ampicillin 02/01/2022>>> 02/02/2022 IV vancomycin 02/02/2022 >>>> 02/05/2022 IV Rocephin 02/05/2022>>>> 02/08/2022 IV daptomycin 02/06/2022>>>>>  02/16/2022   Subjective: Sitting up in bed.  Overall feeling better.  No chest pain.  Denies any shortness of breath.  No nausea or emesis this morning.  Stated had some upper abdominal discomfort and as such did not eat breakfast but no significant pain compared to when she was admitted.  States has been having good urine output.   Objective: Vitals:   02/08/22 2300 02/09/22 0500 02/09/22 0502 02/09/22 0752  BP: (!) 130/44  134/61 139/72  Pulse: 70  68 65  Resp: '16  17 16  '$ Temp: 97.9 F (36.6 C)  97.7 F (36.5 C) 97.7 F (36.5 C)  TempSrc: Oral  Oral Oral  SpO2:    98%  Weight:  112.3 kg    Height:        Intake/Output Summary (Last 24 hours) at 02/09/2022 1006 Last data filed at 02/08/2022 2307 Gross per 24 hour  Intake --  Output 1800 ml  Net -1800 ml    Filed Weights   02/05/22 0400 02/07/22 0605 02/09/22 0500  Weight: 108.9 kg 109.2 kg 112.3 kg    Examination:  General exam: NAD. Respiratory system: Bibasilar crackles.  No wheezing.  No rhonchi.  Fair air movement.  Speaking in full sentences.   Cardiovascular system: RRR no murmurs rubs or gallops.  No JVD.  No lower extremity edema.  Gastrointestinal system: Abdomen is soft, nontender, nondistended, positive bowel sounds.  No rebound.  No guarding.  Central nervous system: Alert and oriented. No focal neurological deficits. Extremities: Symmetric 5 x 5 power. Skin: No rashes, lesions or ulcers Psychiatry: Judgement and insight appear normal. Mood & affect appropriate.     Data Reviewed: I have personally reviewed following labs and imaging  studies  CBC: Recent Labs  Lab 02/05/22 0600 02/06/22 0543 02/07/22 0813 02/08/22 0438 02/09/22 0355  WBC 5.0 5.2 6.7 4.9 5.6  HGB 9.1* 8.9* 9.4* 8.9* 8.9*  HCT 29.0* 27.8* 30.2* 28.2* 28.8*  MCV 84.8 85.3 85.8 86.8 86.2  PLT 138* 123* 143* 140* 152     Basic Metabolic Panel: Recent Labs  Lab 02/05/22 0600 02/06/22 0543 02/07/22 0813 02/08/22 0438  02/09/22 0355  NA 131* 133* 137 135 135  K 5.0 4.5 4.6 4.5 4.2  CL 99 97* 100 99 101  CO2 '22 26 25 26 26  '$ GLUCOSE 299* 197* 110* 156* 172*  BUN 84* 82* 77* 75* 71*  CREATININE 3.19* 3.29* 3.12* 3.06* 2.94*  CALCIUM 8.4* 8.3* 8.6* 8.6* 8.7*  MG  --  1.9 2.2  --   --      GFR: Estimated Creatinine Clearance: 22 mL/min (A) (by C-G formula based on SCr of 2.94 mg/dL (H)).  Liver Function Tests: Recent Labs  Lab 02/03/22 0600 02/04/22 0542  AST 27 23  ALT 33 31  ALKPHOS 205* 255*  BILITOT 0.9 1.0  PROT 5.2* 5.7*  ALBUMIN 2.1* 2.3*     CBG: Recent Labs  Lab 02/07/22 1138 02/07/22 1728 02/08/22 1038 02/08/22 1754 02/09/22 0547  GLUCAP 127* 281* 189* 418* 141*      Recent Results (from the past 240 hour(s))  Blood culture (routine x 2)     Status: Abnormal   Collection Time: 02/01/22  5:00 AM   Specimen: BLOOD RIGHT HAND  Result Value Ref Range Status   Specimen Description BLOOD RIGHT HAND  Final   Special Requests   Final    BOTTLES DRAWN AEROBIC AND ANAEROBIC Blood Culture results may not be optimal due to an inadequate volume of blood received in culture bottles   Culture  Setup Time   Final    GRAM POSITIVE COCCI AEROBIC BOTTLE ONLY CRITICAL VALUE NOTED.  VALUE IS CONSISTENT WITH PREVIOUSLY REPORTED AND CALLED VALUE.    Culture (A)  Final    ENTEROCOCCUS FAECIUM SUSCEPTIBILITIES PERFORMED ON PREVIOUS CULTURE WITHIN THE LAST 5 DAYS. Performed at [facility], [place] [address], [place], [place] [address]    Report Status 02/04/2022 FINAL  Final  Blood culture (routine x 2)     Status: Abnormal   Collection Time: 02/01/22  5:00 AM   Specimen: BLOOD RIGHT ARM  Result Value Ref Range Status   Specimen Description BLOOD RIGHT ARM  Final   Special Requests   Final    BOTTLES DRAWN AEROBIC AND ANAEROBIC Blood Culture results may not be optimal due to an inadequate volume of blood received in culture bottles   Culture  Setup Time   Final    GRAM POSITIVE  COCCI IN CHAINS IN BOTH AEROBIC AND ANAEROBIC BOTTLES CRITICAL RESULT CALLED TO, READ BACK BY AND VERIFIED WITH:  C/ PHARMD S. WATSON 02/01/22 0924 A. LAFRANCE  Performed at [facility], [place] [address] [place], [place] [address]    Culture ENTEROCOCCUS FAECIUM ENTEROCOCCUS FAECALIS  (A)  Final   Report Status 02/04/2022 FINAL  Final   Organism ID, Bacteria ENTEROCOCCUS FAECIUM  Final   Organism ID, Bacteria ENTEROCOCCUS FAECALIS  Final      Susceptibility   Enterococcus faecalis - MIC*    AMPICILLIN <=2 SENSITIVE Sensitive     VANCOMYCIN 1 SENSITIVE Sensitive     GENTAMICIN SYNERGY SENSITIVE Sensitive     * ENTEROCOCCUS FAECALIS  Enterococcus faecium - MIC*    AMPICILLIN >=32 RESISTANT Resistant     VANCOMYCIN <=0.5 SENSITIVE Sensitive     GENTAMICIN SYNERGY SENSITIVE Sensitive     * ENTEROCOCCUS FAECIUM  Blood Culture ID Panel (Reflexed)     Status: Abnormal   Collection Time: 02/01/22  5:00 AM  Result Value Ref Range Status   Enterococcus faecalis DETECTED (A) NOT DETECTED Corrected    Comment: CRITICAL RESULT CALLED TO, READ BACK BY AND VERIFIED WITH:  C/ PHARMD S. WATSON 02/01/22 2124 A. LAFRANCE  CORRECTED ON 09/13 AT 0129: PREVIOUSLY REPORTED AS DETECTED CRITICAL RESULT CALLED TO, READ BACK BY AND VERIFIED WITH:  C/ PHARMD S. WATSON 02/01/22 0924 A. LAFRANCE     Enterococcus Faecium DETECTED (A) NOT DETECTED Corrected    Comment: CRITICAL RESULT CALLED TO, READ BACK BY AND VERIFIED WITH:  C/ PHARMD S. WATSON 02/01/22 2124 A. LAFRANCE  CORRECTED ON 09/13 AT 0129: PREVIOUSLY REPORTED AS DETECTED CRITICAL RESULT CALLED TO, READ BACK BY AND VERIFIED WITH:  C/ PHARMD S. WATSON 02/01/22 0924 A. LAFRANCE     Listeria monocytogenes NOT DETECTED NOT DETECTED Final   Staphylococcus species NOT DETECTED NOT DETECTED Final   Staphylococcus aureus (BCID) NOT DETECTED NOT DETECTED Final   Staphylococcus epidermidis NOT DETECTED NOT DETECTED Final   Staphylococcus lugdunensis  NOT DETECTED NOT DETECTED Final   Streptococcus species NOT DETECTED NOT DETECTED Final   Streptococcus agalactiae NOT DETECTED NOT DETECTED Final   Streptococcus pneumoniae NOT DETECTED NOT DETECTED Final   Streptococcus pyogenes NOT DETECTED NOT DETECTED Final   A.calcoaceticus-baumannii NOT DETECTED NOT DETECTED Final   Bacteroides fragilis NOT DETECTED NOT DETECTED Final   Enterobacterales NOT DETECTED NOT DETECTED Final   Enterobacter cloacae complex NOT DETECTED NOT DETECTED Final   Escherichia coli NOT DETECTED NOT DETECTED Final   Klebsiella aerogenes NOT DETECTED NOT DETECTED Final   Klebsiella oxytoca NOT DETECTED NOT DETECTED Final   Klebsiella pneumoniae NOT DETECTED NOT DETECTED Final   Proteus species NOT DETECTED NOT DETECTED Final   Salmonella species NOT DETECTED NOT DETECTED Final   Serratia marcescens NOT DETECTED NOT DETECTED Final   Haemophilus influenzae NOT DETECTED NOT DETECTED Final   Neisseria meningitidis NOT DETECTED NOT DETECTED Final   Pseudomonas aeruginosa NOT DETECTED NOT DETECTED Final   Stenotrophomonas maltophilia NOT DETECTED NOT DETECTED Final   Candida albicans NOT DETECTED NOT DETECTED Final   Candida auris NOT DETECTED NOT DETECTED Final   Candida glabrata NOT DETECTED NOT DETECTED Final   Candida krusei NOT DETECTED NOT DETECTED Final   Candida parapsilosis NOT DETECTED NOT DETECTED Final   Candida tropicalis NOT DETECTED NOT DETECTED Final   Cryptococcus neoformans/gattii NOT DETECTED NOT DETECTED Final   Vancomycin resistance NOT DETECTED NOT DETECTED Final    Comment: Performed at [facility], [address] [place], [place] [address]  Culture, blood (Routine X 2) w Reflex to ID Panel     Status: None   Collection Time: 02/03/22  9:39 AM   Specimen: BLOOD RIGHT WRIST  Result Value Ref Range Status   Specimen Description BLOOD RIGHT WRIST  Final   Special Requests   Final    BOTTLES DRAWN AEROBIC AND ANAEROBIC Blood Culture  adequate volume   Culture   Final    NO GROWTH 5 DAYS Performed at [facility], [place] [address], [place], [place] [address]    Report Status 02/08/2022 FINAL  Final  Culture, blood (Routine X 2) w  Reflex to ID Panel     Status: None   Collection Time: 02/03/22  9:45 AM   Specimen: BLOOD RIGHT FOREARM  Result Value Ref Range Status   Specimen Description BLOOD RIGHT FOREARM  Final   Special Requests   Final    BOTTLES DRAWN AEROBIC AND ANAEROBIC Blood Culture adequate volume   Culture   Final    NO GROWTH 5 DAYS Performed at [facility], [address] [place], [place] [address]    Report Status 02/08/2022 FINAL  Final         Radiology Studies: No results found.      Scheduled Meds:  amiodarone  200 mg Oral Daily   apixaban  5 mg Oral BID   aspirin EC  81 mg Oral Q breakfast   bisacodyl  10 mg Rectal Once   carvedilol  6.25 mg Oral BID WC   insulin aspart  0-9 Units Subcutaneous TID AC & HS   insulin detemir  15 Units Subcutaneous BID   levothyroxine  50 mcg Oral QAC breakfast   magnesium oxide  400 mg Oral Daily   polyethylene glycol  17 g Oral BID   pregabalin  150 mg Oral BID   senna-docusate  1 tablet Oral BID   sodium chloride flush  3 mL Intravenous Q12H   torsemide  40 mg Oral BID   ursodiol  600 mg Oral TID   Continuous Infusions:  sodium chloride     DAPTOmycin (CUBICIN) 800 mg in sodium chloride 0.9 % IVPB 800 mg (02/08/22 2306)     LOS: 8 days    Time spent: 40 minutes    Irine Seal, MD [facility]   To contact the attending provider between 7A-7P or the covering provider during after hours 7P-7A, please log into the web site [email or web address] and access using universal [place] password for that web site. If you do not have the password, please call the hospital operator.  02/09/2022, 10:06 AM

## 2022-02-10 DIAGNOSIS — N184 Chronic kidney disease, stage 4 (severe): Secondary | ICD-10-CM | POA: Diagnosis not present

## 2022-02-10 DIAGNOSIS — E1122 Type 2 diabetes mellitus with diabetic chronic kidney disease: Secondary | ICD-10-CM | POA: Diagnosis not present

## 2022-02-10 DIAGNOSIS — B379 Candidiasis, unspecified: Secondary | ICD-10-CM

## 2022-02-10 DIAGNOSIS — R7881 Bacteremia: Secondary | ICD-10-CM | POA: Diagnosis not present

## 2022-02-10 DIAGNOSIS — I5023 Acute on chronic systolic (congestive) heart failure: Secondary | ICD-10-CM | POA: Diagnosis not present

## 2022-02-10 DIAGNOSIS — I5022 Chronic systolic (congestive) heart failure: Secondary | ICD-10-CM | POA: Diagnosis not present

## 2022-02-10 DIAGNOSIS — I130 Hypertensive heart and chronic kidney disease with heart failure and stage 1 through stage 4 chronic kidney disease, or unspecified chronic kidney disease: Secondary | ICD-10-CM | POA: Diagnosis not present

## 2022-02-10 DIAGNOSIS — K851 Biliary acute pancreatitis without necrosis or infection: Secondary | ICD-10-CM | POA: Diagnosis not present

## 2022-02-10 LAB — RENAL FUNCTION PANEL
Albumin: 2.6 g/dL — ABNORMAL LOW (ref 3.5–5.0)
Anion gap: 12 (ref 5–15)
BUN: 69 mg/dL — ABNORMAL HIGH (ref 8–23)
CO2: 25 mmol/L (ref 22–32)
Calcium: 9.4 mg/dL (ref 8.9–10.3)
Chloride: 100 mmol/L (ref 98–111)
Creatinine, Ser: 3.19 mg/dL — ABNORMAL HIGH (ref 0.44–1.00)
GFR, Estimated: 15 mL/min — ABNORMAL LOW (ref >60)
Glucose, Bld: 284 mg/dL — ABNORMAL HIGH (ref 70–99)
Phosphorus: 3.8 mg/dL (ref 2.5–4.6)
Potassium: 4.7 mmol/L (ref 3.5–5.1)
Sodium: 137 mmol/L (ref 135–145)

## 2022-02-10 LAB — GLUCOSE, CAPILLARY
Glucose-Capillary: 185 mg/dL — ABNORMAL HIGH (ref 70–99)
Glucose-Capillary: 107 mg/dL — ABNORMAL HIGH (ref 70–99)
Glucose-Capillary: 246 mg/dL — ABNORMAL HIGH (ref 70–99)
Glucose-Capillary: 396 mg/dL — ABNORMAL HIGH (ref 70–99)
Glucose-Capillary: 281 mg/dL — ABNORMAL HIGH (ref 70–99)

## 2022-02-10 LAB — CBC WITH DIFFERENTIAL/PLATELET
Abs Immature Granulocytes: 0.02 10*3/uL (ref 0.00–0.07)
Basophils Absolute: 0 10*3/uL (ref 0.0–0.1)
Basophils Relative: 1 %
Eosinophils Absolute: 0.6 10*3/uL — ABNORMAL HIGH (ref 0.0–0.5)
Eosinophils Relative: 10 %
HCT: 31.8 % — ABNORMAL LOW (ref 36.0–46.0)
Hemoglobin: 10 g/dL — ABNORMAL LOW (ref 12.0–15.0)
Immature Granulocytes: 0 %
Lymphocytes Relative: 10 %
Lymphs Abs: 0.6 10*3/uL — ABNORMAL LOW (ref 0.7–4.0)
MCH: 27 pg (ref 26.0–34.0)
MCHC: 31.4 g/dL (ref 30.0–36.0)
MCV: 85.7 fL (ref 80.0–100.0)
Monocytes Absolute: 0.4 10*3/uL (ref 0.1–1.0)
Monocytes Relative: 7 %
Neutro Abs: 4.3 10*3/uL (ref 1.7–7.7)
Neutrophils Relative %: 72 %
Platelets: 180 10*3/uL (ref 150–400)
RBC: 3.71 MIL/uL — ABNORMAL LOW (ref 3.87–5.11)
RDW: 16.3 % — ABNORMAL HIGH (ref 11.5–15.5)
WBC: 5.9 10*3/uL (ref 4.0–10.5)
nRBC: 0 % (ref 0.0–0.2)

## 2022-02-10 LAB — MAGNESIUM: Magnesium: 1.9 mg/dL (ref 1.7–2.4)

## 2022-02-10 MED ORDER — CLOTRIMAZOLE 1 % VA CREA
1.0000 | TOPICAL_CREAM | Freq: Every day | VAGINAL | Status: DC
  Administered 2022-02-10: 1 via VAGINAL
  Filled 2022-02-10: qty 45

## 2022-02-10 MED ORDER — CLOTRIMAZOLE 1 % EX CREA
TOPICAL_CREAM | Freq: Two times a day (BID) | CUTANEOUS | Status: DC
  Filled 2022-02-10: qty 15

## 2022-02-10 MED ORDER — FLUCONAZOLE 150 MG PO TABS
150.0000 mg | ORAL_TABLET | Freq: Once | ORAL | Status: AC
  Administered 2022-02-10: 150 mg via ORAL
  Filled 2022-02-10: qty 1

## 2022-02-10 MED ORDER — INSULIN ASPART 100 UNIT/ML IJ SOLN
3.0000 [IU] | Freq: Three times a day (TID) | INTRAMUSCULAR | Status: DC
  Administered 2022-02-10 – 2022-02-11 (×2): 3 [IU] via SUBCUTANEOUS

## 2022-02-10 NOTE — Plan of Care (Signed)
  Problem: Education: Goal: Ability to describe self-care measures that may prevent or decrease complications (Diabetes Survival Skills Education) will improve Outcome: Progressing Goal: Individualized Educational Video(s) Outcome: Progressing   Problem: Coping: Goal: Ability to adjust to condition or change in health will improve Outcome: Progressing   Problem: Fluid Volume: Goal: Ability to maintain a balanced intake and output will improve Outcome: Progressing   Problem: Health Behavior/Discharge Planning: Goal: Ability to identify and utilize available resources and services will improve Outcome: Progressing Goal: Ability to manage health-related needs will improve Outcome: Progressing   Problem: Metabolic: Goal: Ability to maintain appropriate glucose levels will improve Outcome: Progressing   Problem: Nutritional: Goal: Maintenance of adequate nutrition will improve Outcome: Progressing Goal: Progress toward achieving an optimal weight will improve Outcome: Progressing   Problem: Skin Integrity: Goal: Risk for impaired skin integrity will decrease Outcome: Progressing   Problem: Tissue Perfusion: Goal: Adequacy of tissue perfusion will improve Outcome: Progressing   Problem: Education: Goal: Knowledge of General Education information will improve Description: Including pain rating scale, medication(s)/side effects and non-pharmacologic comfort measures Outcome: Progressing   Problem: Health Behavior/Discharge Planning: Goal: Ability to manage health-related needs will improve Outcome: Progressing   Problem: Clinical Measurements: Goal: Ability to maintain clinical measurements within normal limits will improve Outcome: Progressing Goal: Will remain free from infection Outcome: Progressing Goal: Diagnostic test results will improve Outcome: Progressing Goal: Respiratory complications will improve Outcome: Progressing Goal: Cardiovascular complication will  be avoided Outcome: Progressing   Problem: Activity: Goal: Risk for activity intolerance will decrease Outcome: Progressing   Problem: Nutrition: Goal: Adequate nutrition will be maintained Outcome: Progressing   Problem: Coping: Goal: Level of anxiety will decrease Outcome: Progressing   Problem: Elimination: Goal: Will not experience complications related to bowel motility Outcome: Progressing Goal: Will not experience complications related to urinary retention Outcome: Progressing   Problem: Pain Managment: Goal: General experience of comfort will improve Outcome: Progressing   Problem: Safety: Goal: Ability to remain free from injury will improve Outcome: Progressing   Problem: Skin Integrity: Goal: Risk for impaired skin integrity will decrease Outcome: Progressing   Problem: Education: Goal: Understanding of CV disease, CV risk reduction, and recovery process will improve Outcome: Progressing Goal: Individualized Educational Video(s) Outcome: Progressing   Problem: Activity: Goal: Ability to return to baseline activity level will improve Outcome: Progressing   Problem: Cardiovascular: Goal: Ability to achieve and maintain adequate cardiovascular perfusion will improve Outcome: Progressing Goal: Vascular access site(s) Level 0-1 will be maintained Outcome: Progressing   Problem: Health Behavior/Discharge Planning: Goal: Ability to safely manage health-related needs after discharge will improve Outcome: Progressing   

## 2022-02-10 NOTE — Progress Notes (Signed)
Occupational Therapy Treatment Patient Details Name: Susan [name] MRN: [id number] DOB: November 15, 1950 Today's Date: 02/10/2022   History of present illness Pt is a 71 y.o. female admitted 01/31/22 with worsening abdominal pain. Workup for gallstone pancreatitis. S/p ERCP 9/11. Course complicated by sepsis with enterococcus bacteremia. S/p TEE 9/12; no evidence of endocarditis.   Of note, recent admission 01/14/22-01/23/22 for DKA, HF, afib with RVR. Other PMH includes HF (LVEF 30-35%), CKD IV, anemia, DM2, HTN, HLD, PAF on Eliquis, chronic hypoxic respiratory failure (2L O2 at night), PAD s/p R transmet amputation.   OT comments  Patient able to get to EOB from supine with supervision and was min guard to stand from raised bed to RW. Patient able to ambulate to sink and stood for grooming tasks and ambulated to recliner with min guard assist. Patient instructed on AE training for LB dressing with reacher and sock aide. Patient able to doff socks with reacher and was min assist to donn socks with sock aide. Patient attempted to use sock aide with compression stocking but was too difficulty to and required max assist. Patient educated on ways to purchase AE. Acute OT to continue to follow and discharge recommendations continue to be appropriate.    Recommendations for follow up therapy are one component of a multi-disciplinary discharge planning process, led by the attending physician.  Recommendations may be updated based on patient status, additional functional criteria and insurance authorization.    Follow Up Recommendations  Home health OT    Assistance Recommended at Discharge Intermittent Supervision/Assistance  Patient can return home with the following  A little help with walking and/or transfers;A little help with bathing/dressing/bathroom;Assistance with cooking/housework;Assist for transportation;Help with stairs or ramp for entrance   Equipment Recommendations  None recommended by OT     Recommendations for Other Services      Precautions / Restrictions Precautions Precautions: Fall Precaution Comments: pt reports special orthotic shoes at home for R TMA but family cannot bring to hospital Restrictions Weight Bearing Restrictions: No       Mobility Bed Mobility Overal bed mobility: Needs Assistance Bed Mobility: Rolling, Sidelying to Sit Rolling: Supervision Sidelying to sit: Supervision       General bed mobility comments: able to get self to EOB with supervision and use of bed rail    Transfers Overall transfer level: Needs assistance Equipment used: Rolling walker (2 wheels) Transfers: Sit to/from Stand Sit to Stand: Min guard     Step pivot transfers: Min guard     General transfer comment: stood from raised bed with min guard assist and ambulated in room before transfer to recliner with min guard assist and cues for safety and hand placement     Balance Overall balance assessment: Needs assistance Sitting-balance support: No upper extremity supported, Feet supported Sitting balance-Leahy Scale: Good     Standing balance support: Single extremity supported, During functional activity Standing balance-Leahy Scale: Fair Standing balance comment: standing at sink to perform grooming tasks                           ADL either performed or assessed with clinical judgement   ADL Overall ADL's : Needs assistance/impaired     Grooming: Wash/dry hands;Wash/dry face;Oral care;Brushing hair;Supervision/safety;Standing               Lower Body Dressing: Minimal assistance Lower Body Dressing Details (indicate cue type and reason): education on adaptive equipment use for LB dressing with reacher  to doff socks with supervision and min assist with sock aide use               General ADL Comments: able to ambulate from EOB to sink and stood to perform grooming before transfer to recliner    Extremity/Trunk Assessment Upper  Extremity Assessment RUE Deficits / Details: decreased AROM shoulders RUE Coordination: decreased fine motor;decreased gross motor LUE Deficits / Details: decreased AROM shoulders LUE Coordination: decreased gross motor;decreased fine motor            Vision       Perception     Praxis      Cognition Arousal/Alertness: Awake/alert Behavior During Therapy: WFL for tasks assessed/performed Overall Cognitive Status: No family/caregiver present to determine baseline cognitive functioning Area of Impairment: Awareness, Problem solving                 Orientation Level: Disoriented to, Time     Following Commands: Follows one step commands consistently, Follows multi-step commands with increased time Safety/Judgement: Decreased awareness of deficits Awareness: Emergent Problem Solving: Requires verbal cues General Comments: fearful of falling during mobility but demonstrated safe balanace        Exercises      Shoulder Instructions       General Comments      Pertinent Vitals/ Pain       Pain Assessment Pain Assessment: No/denies pain Faces Pain Scale: No hurt Pain Intervention(s): Monitored during session  Home Living                                          Prior Functioning/Environment              Frequency  Min 2X/week        Progress Toward Goals  OT Goals(current goals can now be found in the care plan section)  Progress towards OT goals: Progressing toward goals  Acute Rehab OT Goals Patient Stated Goal: get better OT Goal Formulation: With patient Time For Goal Achievement: 02/21/22 Potential to Achieve Goals: Good ADL Goals Pt Will Perform Grooming: with modified independence;standing Pt Will Perform Upper Body Bathing: Independently;sitting Pt Will Perform Lower Body Bathing: with modified independence;sit to/from stand Pt Will Perform Upper Body Dressing: Independently;sitting Pt Will Perform Lower Body  Dressing: with modified independence;sit to/from stand Pt Will Transfer to Toilet: with modified independence;ambulating;grab bars Pt Will Perform Toileting - Clothing Manipulation and hygiene: with modified independence;sit to/from stand Pt/caregiver will Perform Home Exercise Program: Increased ROM;Increased strength;Both right and left upper extremity;With Supervision;With written HEP provided Additional ADL Goal #1: Pt will be Mod I iin and OOB for basic ADLs  Plan Discharge plan remains appropriate    Co-evaluation                 AM-PAC OT "6 Clicks" Daily Activity     Outcome Measure   Help from another person eating meals?: None Help from another person taking care of personal grooming?: A Little Help from another person toileting, which includes using toliet, bedpan, or urinal?: A Little Help from another person bathing (including washing, rinsing, drying)?: A Little Help from another person to put on and taking off regular upper body clothing?: A Little Help from another person to put on and taking off regular lower body clothing?: A Little 6 Click Score: 19    End of Session Equipment Utilized  During Treatment: Rolling walker (2 wheels)  OT Visit Diagnosis: Unsteadiness on feet (R26.81);Other abnormalities of gait and mobility (R26.89);Muscle weakness (generalized) (M62.81)   Activity Tolerance Patient tolerated treatment well   Patient Left in chair;with call bell/phone within reach;with chair alarm set   Nurse Communication Mobility status        Time: 2956-2130 OT Time Calculation (min): 31 min  Charges: OT General Charges $OT Visit: 1 Visit OT Treatments $Self Care/Home Management : 23-37 mins  Lodema Hong, [place]  Office [place] 02/10/2022, 9:57 AM

## 2022-02-10 NOTE — Progress Notes (Signed)
Mobility Specialist Progress Note    02/10/22 1100  Mobility  Activity Ambulated with assistance in hallway  Level of Assistance +2 (takes two people) (chair follow)  Assistive Device Front wheel walker  Distance Ambulated (ft) 300 ft (180+120)  Activity Response Tolerated well  $Mobility charge 1 Mobility   Pre-Mobility: 72 HR, 94% SpO2 During Mobility: 86 HR Post-Mobility: 76 HR, 94% SpO2  Pt received in chair and agreeable. C/o pain from yeast infection w/ blood noted on purewick upon removal. MinA to stand w/ minG during. Encouraged pursed lip breathing. Took x1 seated rest break. Returned to bed with call bell in reach. RN aware.   Hildred Alamin Mobility Specialist

## 2022-02-10 NOTE — Progress Notes (Signed)
Complaining of itchiness and redness on the perianal area. MD made aware with order. Continue to monitor.

## 2022-02-10 NOTE — Progress Notes (Signed)
PROGRESS NOTE    Susan [name]  ZOX:[id number] DOB: [birth year] DOA: 01/31/2022 PCP: Donita Brooks, MD    Chief Complaint  Patient presents with   Abdominal Pain    Emesis/Diarrhea    Brief Narrative:  71yo with hx systolic chf, DM, CKD 4, afib on eliquis presented with epigastric pain with n/v admitted for acute gallstone pancreatitis.   Assessment & Plan:   Principal Problem:   Acute pancreatitis Active Problems:   Hyperlipemia   Chronic HFrEF (heart failure with reduced ejection fraction) (HCC)   CKD (chronic kidney disease), stage IV (HCC)   Nausea & vomiting   Hypothyroidism   Type 2 diabetes mellitus with diabetic polyneuropathy, with long-term current use of insulin (HCC)   Abdominal pain   SIRS (systemic inflammatory response syndrome) (HCC)   Transaminitis   History of anemia due to chronic kidney disease   Paroxysmal atrial fibrillation (HCC)   Bacteremia   Sepsis without acute organ dysfunction (HCC)  #1 acute gallstone pancreatitis -Patient presented with epigastric abdominal pain, nausea vomiting, lipase levels noted to be elevated at 1341. -Abdominal ultrasound done with gallstones with prominent CBD, no cholecystitis noted.   -Patient seen in consultation by GI, underwent MRCP which showed choledocholithiasis with mild pancreatitis 2 mm stone in the dependent portion of the distal common bile duct and gentle tapering of the duct at the level of the ampulla. -Patient subsequently underwent ERCP on 02/03/2022 consistent with choledocholithiasis, patient underwent biliary sphincterotomy and balloon extraction. -Patient seen in consultation by general surgery with no plans for laparoscopic cholecystectomy during this hospitalization as patient noted not to have an acute infection noted to be high surgical risk for cardiac complication from cardiac standpoint with CKD. -Continue home regimen ursodiol. -Continue IV antibiotics for bacteremia. -ID following, GI  following, general surgery following. -Diet advanced per general surgery.  2.  Sepsis with Enterococcus bacteremia, POA -On presentation patient noted to have a leukocytosis white count of 20.1, interval increase compared to 2 days ago, mild tachycardia. -Blood cultures obtained positive for Enterococcus with repeat blood cultures pending with no growth to date. -Patient seen by ID, initial recommendations was to come continue ampicillin and follow-up on TEE. -TEE done per cardiology 02/04/2022 negative for endocarditis. -IV antibiotics switched to IV vancomycin per ID and subsequently placed on IV daptomycin, who are following. -Per ID patient will need PICC line placed if repeat blood cultures are negative x72 hours and will need a 2-week course of IV antibiotics from negative culture. -Patient with CKD stage IV, discussed with primary nephrologist, Dr. Signe Colt who strongly discouraged use placement of PICC line on this patient as patient will likely be on hemodialysis in the near future and recommending single-lumen tunneled central line if long-term IV antibiotics are needed. -Due to restrictions with PICC placement, secondary to CKD, ID recommending initiation of  linezolid on discharge for 2 more weeks from negative culture with end of therapy 02/16/2022. -Continue IV daptomycin while in-house. -Will need outpatient follow-up with ID. -Per ID.  3.  Chronic biventricular systolic heart failure -Diuretics initially were on hold.  -Due to CKD stage IV noted not on ACE inhibitor or ARB. -Patient with some diffuse scattered crackles on 02/05/2022 which has subsequently improved patient currently euvolemic on examination. -IV fluids stopped. -Demadex resumed at 40 mg daily. -Patient with some bibasilar crackles noted on examination and as such we will uptitrate Demadex back to home regimen of 80 twice daily.  -Monitor renal function. -Resume metolazone on discharge.   -  Cardiology was following  but have signed off.   4.  Poorly controlled type 2 diabetes mellitus -Most recent hemoglobin A1c > 15.5 (01/20/2022) -CBG of 185 this morning. -Continue Levemir to 15 units twice daily.  SSI. -Start NovoLog 3 units 3 times daily with meals. -Patient noted to be on Januvia prior to admission however given presentation of pancreatitis will likely not resume on discharge. -Diabetes coordinator following. -Outpatient follow-up with PCP/endocrinologist. -  5.  Hypothyroidism -Synthroid.  6.  Dysuria/?  UTI -Patient complains of some dysuria and suprapubic abdominal pain. -Urinalysis done concerning for UTI. -Unsure as to whether urine cultures were actually sent as results are still pending. -Status post 3 days IV Rocephin.   -Patient currently on daptomycin.    7.  CKD stage IV -Stable. -  8.  Anemia of chronic kidney disease -Hemoglobin stable at 10.0.  9.  Hyperlipidemia -Continue to hold statin and resume on discharge.   10.  Paroxysmal atrial fibrillation, CHA2DS2VASC score 6 -Continue amiodarone for rate control.   -Patient cleared by general surgery for resumption of Eliquis which was started 02/05/2022.  11.  Yeast infection -Patient with complaints of vaginal itching, per RN patient noted to have Candida infection in the perineal region. -Placed on clotrimazole cream, Diflucan 150 mg p.o. x1, clotrimazole vaginal atrocious.    DVT prophylaxis: Eliquis Code Status: Full Family Communication:  Disposition: Likely home when clinically improved, tolerating current diet, and cleared by GI, general surgery and ID, hopefully in the next 1 to 2 days.  Status is: Inpatient Remains inpatient appropriate because: Severity of illness   Consultants:  Gastroenterology: Dr. Lavon Paganini 02/01/2022 ID: Dr. Daiva Eves 02/02/2022 General surgery: Dr. Derrell Lolling 02/04/2022  Procedures:  Chest x-ray 02/01/2022 ERCP 02/03/2022 per Dr. Elnoria Howard TEE 02/04/2022 per Dr. Gala Romney MRCP 02/01/2022 Renal  ultrasound 02/02/2022 Right upper quadrant ultrasound 02/01/2022 2D echo 02/02/2022 Bilateral upper extremity Dopplers 01/30/2022  Antimicrobials:  IV ampicillin 02/01/2022>>> 02/02/2022 IV vancomycin 02/02/2022 >>>> 02/05/2022 IV Rocephin 02/05/2022>>>> 02/08/2022 IV daptomycin 02/06/2022>>>>> 02/16/2022   Subjective: Patient laying in bed.  Complaining of vaginal itching concerned she might have a yeast infection.  No chest pain.  No shortness of breath.  No emesis.  Nausea improved.  Tolerating current diet.  Good urine output.    Objective: Vitals:   02/09/22 2345 02/10/22 0200 02/10/22 0345 02/10/22 0637  BP: (!) 128/59  (!) 121/52   Pulse: 75 69 67   Resp: 17 13 12    Temp: 98.2 F (36.8 C)  97.9 F (36.6 C)   TempSrc: Oral  Oral   SpO2: 99%  97%   Weight:    108.7 kg  Height:        Intake/Output Summary (Last 24 hours) at 02/10/2022 1058 Last data filed at 02/10/2022 0850 Gross per 24 hour  Intake 540 ml  Output 1680 ml  Net -1140 ml    Filed Weights   02/07/22 0605 02/09/22 0500 02/10/22 0637  Weight: 109.2 kg 112.3 kg 108.7 kg    Examination:  General exam: NAD. Respiratory system: Decreasing bibasilar crackles.  No wheezing.  No rhonchi.  Fair air movement.  Speaking in full sentences.   Cardiovascular system: Regular rate rhythm no murmurs rubs or gallops.  No JVD.  No lower extremity edema.  Gastrointestinal system: Abdomen is soft, nontender, nondistended, positive bowel sounds.  No rebound.  No guarding.   Central nervous system: Alert and oriented. No focal neurological deficits. Extremities: Symmetric 5 x 5 power. Skin: No  rashes, lesions or ulcers Psychiatry: Judgement and insight appear normal. Mood & affect appropriate.     Data Reviewed: I have personally reviewed following labs and imaging studies  CBC: Recent Labs  Lab 02/06/22 0543 02/07/22 0813 02/08/22 0438 02/09/22 0355 02/10/22 1016  WBC 5.2 6.7 4.9 5.6 5.9  NEUTROABS  --   --   --   --   4.3  HGB 8.9* 9.4* 8.9* 8.9* 10.0*  HCT 27.8* 30.2* 28.2* 28.8* 31.8*  MCV 85.3 85.8 86.8 86.2 85.7  PLT 123* 143* 140* 152 180     Basic Metabolic Panel: Recent Labs  Lab 02/05/22 0600 02/06/22 0543 02/07/22 0813 02/08/22 0438 02/09/22 0355  NA 131* 133* 137 135 135  K 5.0 4.5 4.6 4.5 4.2  CL 99 97* 100 99 101  CO2 22 26 25 26 26   GLUCOSE 299* 197* 110* 156* 172*  BUN 84* 82* 77* 75* 71*  CREATININE 3.19* 3.29* 3.12* 3.06* 2.94*  CALCIUM 8.4* 8.3* 8.6* 8.6* 8.7*  MG  --  1.9 2.2  --   --      GFR: Estimated Creatinine Clearance: 21.6 mL/min (A) (by C-G formula based on SCr of 2.94 mg/dL (H)).  Liver Function Tests: Recent Labs  Lab 02/04/22 0542  AST 23  ALT 31  ALKPHOS 255*  BILITOT 1.0  PROT 5.7*  ALBUMIN 2.3*     CBG: Recent Labs  Lab 02/09/22 0547 02/09/22 1125 02/09/22 1604 02/09/22 2144 02/10/22 0606  GLUCAP 141* 205* 264* 230* 185*      Recent Results (from the past 240 hour(s))  Blood culture (routine x 2)     Status: Abnormal   Collection Time: 02/01/22  5:00 AM   Specimen: BLOOD RIGHT HAND  Result Value Ref Range Status   Specimen Description BLOOD RIGHT HAND  Final   Special Requests   Final    BOTTLES DRAWN AEROBIC AND ANAEROBIC Blood Culture results may not be optimal due to an inadequate volume of blood received in culture bottles   Culture  Setup Time   Final    GRAM POSITIVE COCCI AEROBIC BOTTLE ONLY CRITICAL VALUE NOTED.  VALUE IS CONSISTENT WITH PREVIOUSLY REPORTED AND CALLED VALUE.    Culture (A)  Final    ENTEROCOCCUS FAECIUM SUSCEPTIBILITIES PERFORMED ON PREVIOUS CULTURE WITHIN THE LAST 5 DAYS. Performed at [facility], [address] [place], [place] [address]    Report Status 02/04/2022 FINAL  Final  Blood culture (routine x 2)     Status: Abnormal   Collection Time: 02/01/22  5:00 AM   Specimen: BLOOD RIGHT ARM  Result Value Ref Range Status   Specimen Description BLOOD RIGHT ARM  Final   Special  Requests   Final    BOTTLES DRAWN AEROBIC AND ANAEROBIC Blood Culture results may not be optimal due to an inadequate volume of blood received in culture bottles   Culture  Setup Time   Final    GRAM POSITIVE COCCI IN CHAINS IN BOTH AEROBIC AND ANAEROBIC BOTTLES CRITICAL RESULT CALLED TO, READ BACK BY AND VERIFIED WITH:  C/ PHARMD S. WATSON 02/01/22 0924 A. LAFRANCE  Performed at [facility], [address] [place], [place] [address]    Culture ENTEROCOCCUS FAECIUM ENTEROCOCCUS FAECALIS  (A)  Final   Report Status 02/04/2022 FINAL  Final   Organism ID, Bacteria ENTEROCOCCUS FAECIUM  Final   Organism ID, Bacteria ENTEROCOCCUS FAECALIS  Final      Susceptibility   Enterococcus  faecalis - MIC*    AMPICILLIN <=2 SENSITIVE Sensitive     VANCOMYCIN 1 SENSITIVE Sensitive     GENTAMICIN SYNERGY SENSITIVE Sensitive     * ENTEROCOCCUS FAECALIS   Enterococcus faecium - MIC*    AMPICILLIN >=32 RESISTANT Resistant     VANCOMYCIN <=0.5 SENSITIVE Sensitive     GENTAMICIN SYNERGY SENSITIVE Sensitive     * ENTEROCOCCUS FAECIUM  Blood Culture ID Panel (Reflexed)     Status: Abnormal   Collection Time: 02/01/22  5:00 AM  Result Value Ref Range Status   Enterococcus faecalis DETECTED (A) NOT DETECTED Corrected    Comment: CRITICAL RESULT CALLED TO, READ BACK BY AND VERIFIED WITH:  C/ PHARMD S. WATSON 02/01/22 2124 A. LAFRANCE  CORRECTED ON 09/13 AT 0129: PREVIOUSLY REPORTED AS DETECTED CRITICAL RESULT CALLED TO, READ BACK BY AND VERIFIED WITH:  C/ PHARMD S. WATSON 02/01/22 0924 A. LAFRANCE     Enterococcus Faecium DETECTED (A) NOT DETECTED Corrected    Comment: CRITICAL RESULT CALLED TO, READ BACK BY AND VERIFIED WITH:  C/ PHARMD S. WATSON 02/01/22 2124 A. LAFRANCE  CORRECTED ON 09/13 AT 0129: PREVIOUSLY REPORTED AS DETECTED CRITICAL RESULT CALLED TO, READ BACK BY AND VERIFIED WITH:  C/ PHARMD S. WATSON 02/01/22 0924 A. LAFRANCE     Listeria monocytogenes NOT DETECTED NOT DETECTED Final    Staphylococcus species NOT DETECTED NOT DETECTED Final   Staphylococcus aureus (BCID) NOT DETECTED NOT DETECTED Final   Staphylococcus epidermidis NOT DETECTED NOT DETECTED Final   Staphylococcus lugdunensis NOT DETECTED NOT DETECTED Final   Streptococcus species NOT DETECTED NOT DETECTED Final   Streptococcus agalactiae NOT DETECTED NOT DETECTED Final   Streptococcus pneumoniae NOT DETECTED NOT DETECTED Final   Streptococcus pyogenes NOT DETECTED NOT DETECTED Final   A.calcoaceticus-baumannii NOT DETECTED NOT DETECTED Final   Bacteroides fragilis NOT DETECTED NOT DETECTED Final   Enterobacterales NOT DETECTED NOT DETECTED Final   Enterobacter cloacae complex NOT DETECTED NOT DETECTED Final   Escherichia coli NOT DETECTED NOT DETECTED Final   Klebsiella aerogenes NOT DETECTED NOT DETECTED Final   Klebsiella oxytoca NOT DETECTED NOT DETECTED Final   Klebsiella pneumoniae NOT DETECTED NOT DETECTED Final   Proteus species NOT DETECTED NOT DETECTED Final   Salmonella species NOT DETECTED NOT DETECTED Final   Serratia marcescens NOT DETECTED NOT DETECTED Final   Haemophilus influenzae NOT DETECTED NOT DETECTED Final   Neisseria meningitidis NOT DETECTED NOT DETECTED Final   Pseudomonas aeruginosa NOT DETECTED NOT DETECTED Final   Stenotrophomonas maltophilia NOT DETECTED NOT DETECTED Final   Candida albicans NOT DETECTED NOT DETECTED Final   Candida auris NOT DETECTED NOT DETECTED Final   Candida glabrata NOT DETECTED NOT DETECTED Final   Candida krusei NOT DETECTED NOT DETECTED Final   Candida parapsilosis NOT DETECTED NOT DETECTED Final   Candida tropicalis NOT DETECTED NOT DETECTED Final   Cryptococcus neoformans/gattii NOT DETECTED NOT DETECTED Final   Vancomycin resistance NOT DETECTED NOT DETECTED Final    Comment: Performed at [facility], [address], [place], [place] [address]  Culture, blood (Routine X 2) w Reflex to ID Panel     Status: None   Collection Time:  02/03/22  9:39 AM   Specimen: BLOOD RIGHT WRIST  Result Value Ref Range Status   Specimen Description BLOOD RIGHT WRIST  Final   Special Requests   Final    BOTTLES DRAWN AEROBIC AND ANAEROBIC Blood Culture adequate volume   Culture   Final  NO GROWTH 5 DAYS Performed at [facility], [address] [place], [place] [address]    Report Status 02/08/2022 FINAL  Final  Culture, blood (Routine X 2) w Reflex to ID Panel     Status: None   Collection Time: 02/03/22  9:45 AM   Specimen: BLOOD RIGHT FOREARM  Result Value Ref Range Status   Specimen Description BLOOD RIGHT FOREARM  Final   Special Requests   Final    BOTTLES DRAWN AEROBIC AND ANAEROBIC Blood Culture adequate volume   Culture   Final    NO GROWTH 5 DAYS Performed at [facility], [address], [place], [place] [address]    Report Status 02/08/2022 FINAL  Final         Radiology Studies: No results found.      Scheduled Meds:  amiodarone  200 mg Oral Daily   apixaban  5 mg Oral BID   aspirin EC  81 mg Oral Q breakfast   bisacodyl  10 mg Rectal Once   carvedilol  6.25 mg Oral BID WC   clotrimazole  1 Applicatorful Vaginal QHS   clotrimazole   Topical BID   fluconazole  150 mg Oral Once   insulin aspart  0-9 Units Subcutaneous TID AC & HS   insulin detemir  15 Units Subcutaneous BID   levothyroxine  50 mcg Oral QAC breakfast   magnesium oxide  400 mg Oral Daily   polyethylene glycol  17 g Oral BID   pregabalin  150 mg Oral BID   senna-docusate  1 tablet Oral BID   sodium chloride flush  3 mL Intravenous Q12H   torsemide  80 mg Oral BID   ursodiol  600 mg Oral TID   Continuous Infusions:  sodium chloride     DAPTOmycin (CUBICIN) 800 mg in sodium chloride 0.9 % IVPB 800 mg (02/08/22 2306)     LOS: 9 days    Time spent: 40 minutes    Ramiro Harvest, MD [facility]   To contact the attending provider between 7A-7P or the covering provider during after hours 7P-7A,  please log into the web site [email or web address] and access using universal [place] password for that web site. If you do not have the password, please call the hospital operator.  02/10/2022, 10:58 AM

## 2022-02-10 NOTE — Inpatient Diabetes Management (Signed)
Inpatient Diabetes Program Recommendations  AACE/ADA: New Consensus Statement on Inpatient Glycemic Control   Target Ranges:  Prepandial:   less than 140 mg/dL      Peak postprandial:   less than 180 mg/dL (1-2 hours)      Critically ill patients:  140 - 180 mg/dL    Latest Reference Range & Units 02/10/22 06:06 02/10/22 11:20  Glucose-Capillary 70 - 99 mg/dL 185 (H) 281 (H)    Latest Reference Range & Units 02/09/22 05:47 02/09/22 11:25 02/09/22 16:04 02/09/22 21:44  Glucose-Capillary 70 - 99 mg/dL 141 (H) 205 (H) 264 (H) 230 (H)   Review of Glycemic Control   Current orders for Inpatient glycemic control: Levemir 15 units BID, Novolog 0-9 units AC&HS  Inpatient Diabetes Program Recommendations:    Insulin: Please consider ordering Novolog 3 units TID with meals for meal coverage if patient eats at least 50% of meals.  Thanks, Barnie Alderman, RN, MSN, [place] Diabetes Coordinator Inpatient Diabetes Program [phone] (Team Pager from 8am to [place])

## 2022-02-10 NOTE — Progress Notes (Signed)
Physical Therapy Treatment Patient Details Name: Susan [name] MRN: [id number] DOB: 1950-11-26 Today's Date: 02/10/2022   History of Present Illness Pt is a 71 y.o. female admitted 01/31/22 with worsening abdominal pain. Workup for gallstone pancreatitis. S/p ERCP 9/11. Course complicated by sepsis with enterococcus bacteremia. S/p TEE 9/12; no evidence of endocarditis.   Of note, recent admission 01/14/22-01/23/22 for DKA, HF, afib with RVR. Other PMH includes HF (LVEF 30-35%), CKD IV, anemia, DM2, HTN, HLD, PAF on Eliquis, chronic hypoxic respiratory failure (2L O2 at night), PAD s/p R transmet amputation.    PT Comments    Pt received supine and agreeable to session with continued progress towards acute goals. Pt demonstrating increased ambulation tolerance with RW and up to min guard for safety. Pt continues to be fearful of falling throughout session, however pt demonstrating stable transfers from recliner with 5x STS to RW with good hand placement and no LOB throughout ambulation. Pt continues to be limited by decreased activity tolerance, weakness and fatigue. Pt continues to benefit from skilled PT services to progress toward functional mobility goals.    Recommendations for follow up therapy are one component of a multi-disciplinary discharge planning process, led by the attending physician.  Recommendations may be updated based on patient status, additional functional criteria and insurance authorization.  Follow Up Recommendations  Home health PT     Assistance Recommended at Discharge Intermittent Supervision/Assistance  Patient can return home with the following A little help with walking and/or transfers;A little help with bathing/dressing/bathroom;Assistance with cooking/housework;Assist for transportation;Help with stairs or ramp for entrance   Equipment Recommendations  None recommended by PT    Recommendations for Other Services       Precautions / Restrictions  Precautions Precautions: Fall Precaution Comments: pt reports special orthotic shoes at home for R TMA but family cannot bring to hospital Restrictions Weight Bearing Restrictions: No     Mobility  Bed Mobility Overal bed mobility: Needs Assistance Bed Mobility: Rolling, Sidelying to Sit     Supine to sit: Supervision     General bed mobility comments: able to get self to EOB with supervision and use of bed rail    Transfers Overall transfer level: Needs assistance Equipment used: Rolling walker (2 wheels) Transfers: Sit to/from Stand Sit to Stand: Min guard           General transfer comment: stood from raised bed with min guard and from relciner with min guard with encouragement x5    Ambulation/Gait Ambulation/Gait assistance: Min guard Gait Distance (Feet): 140 Feet Assistive device: Rolling walker (2 wheels) Gait Pattern/deviations: Step-through pattern, Decreased stride length, Wide base of support, Trunk flexed Gait velocity: Decreased     General Gait Details: slow, fatigued gait with RW and min guard for balance; distance limited by BLE fatigue   Stairs             Wheelchair Mobility    Modified Rankin (Stroke Patients Only)       Balance Overall balance assessment: Needs assistance Sitting-balance support: No upper extremity supported, Feet supported Sitting balance-Leahy Scale: Good     Standing balance support: Single extremity supported, During functional activity Standing balance-Leahy Scale: Fair Standing balance comment: standing at sink to perform grooming tasks                            Cognition Arousal/Alertness: Awake/alert Behavior During Therapy: WFL for tasks assessed/performed Overall Cognitive Status: No family/caregiver present to  determine baseline cognitive functioning Area of Impairment: Awareness, Problem solving                 Orientation Level: Disoriented to, Time     Following  Commands: Follows one step commands consistently, Follows multi-step commands with increased time Safety/Judgement: Decreased awareness of deficits Awareness: Emergent Problem Solving: Requires verbal cues General Comments: fearful of falling during all mobility but demonstrated safe balanace        Exercises Other Exercises Other Exercises: STS x 5 from low reclienr    General Comments General comments (skin integrity, edema, etc.): VSS on RA      Pertinent Vitals/Pain Pain Assessment Pain Assessment: No/denies pain    Home Living                          Prior Function            PT Goals (current goals can now be found in the care plan section) Acute Rehab PT Goals PT Goal Formulation: With patient Time For Goal Achievement: 02/20/22    Frequency    Min 3X/week      PT Plan Current plan remains appropriate    Co-evaluation              AM-PAC PT "6 Clicks" Mobility   Outcome Measure  Help needed turning from your back to your side while in a flat bed without using bedrails?: A Little Help needed moving from lying on your back to sitting on the side of a flat bed without using bedrails?: A Little Help needed moving to and from a bed to a chair (including a wheelchair)?: A Little Help needed standing up from a chair using your arms (e.g., wheelchair or bedside chair)?: A Little Help needed to walk in hospital room?: A Little Help needed climbing 3-5 steps with a railing? : A Lot 6 Click Score: 17    End of Session Equipment Utilized During Treatment: Gait belt Activity Tolerance: Patient tolerated treatment well Patient left: in chair;with call bell/phone within reach Nurse Communication: Mobility status PT Visit Diagnosis: Other abnormalities of gait and mobility (R26.89);Difficulty in walking, not elsewhere classified (R26.2);Muscle weakness (generalized) (M62.81)     Time: 2130-8657 PT Time Calculation (min) (ACUTE ONLY): 28  min  Charges:  $Gait Training: 8-22 mins $Therapeutic Activity: 8-22 mins                     Susan [name] R. PTA Acute Rehabilitation Services Office: [phone]    Susan [name] 02/10/2022, 4:27 PM

## 2022-02-11 ENCOUNTER — Other Ambulatory Visit (HOSPITAL_COMMUNITY): Payer: Self-pay

## 2022-02-11 DIAGNOSIS — R7881 Bacteremia: Secondary | ICD-10-CM | POA: Diagnosis not present

## 2022-02-11 DIAGNOSIS — N184 Chronic kidney disease, stage 4 (severe): Secondary | ICD-10-CM | POA: Diagnosis not present

## 2022-02-11 DIAGNOSIS — I5022 Chronic systolic (congestive) heart failure: Secondary | ICD-10-CM | POA: Diagnosis not present

## 2022-02-11 DIAGNOSIS — K859 Acute pancreatitis without necrosis or infection, unspecified: Secondary | ICD-10-CM | POA: Diagnosis not present

## 2022-02-11 LAB — RENAL FUNCTION PANEL
Albumin: 2.8 g/dL — ABNORMAL LOW (ref 3.5–5.0)
Anion gap: 11 (ref 5–15)
BUN: 68 mg/dL — ABNORMAL HIGH (ref 8–23)
CO2: 27 mmol/L (ref 22–32)
Calcium: 9.7 mg/dL (ref 8.9–10.3)
Chloride: 101 mmol/L (ref 98–111)
Creatinine, Ser: 2.92 mg/dL — ABNORMAL HIGH (ref 0.44–1.00)
GFR, Estimated: 17 mL/min — ABNORMAL LOW (ref >60)
Glucose, Bld: 112 mg/dL — ABNORMAL HIGH (ref 70–99)
Phosphorus: 3.2 mg/dL (ref 2.5–4.6)
Potassium: 4.3 mmol/L (ref 3.5–5.1)
Sodium: 139 mmol/L (ref 135–145)

## 2022-02-11 LAB — CBC
HCT: 32.6 % — ABNORMAL LOW (ref 36.0–46.0)
Hemoglobin: 10.5 g/dL — ABNORMAL LOW (ref 12.0–15.0)
MCH: 27.2 pg (ref 26.0–34.0)
MCHC: 32.2 g/dL (ref 30.0–36.0)
MCV: 84.5 fL (ref 80.0–100.0)
Platelets: 207 10*3/uL (ref 150–400)
RBC: 3.86 MIL/uL — ABNORMAL LOW (ref 3.87–5.11)
RDW: 16.1 % — ABNORMAL HIGH (ref 11.5–15.5)
WBC: 12 10*3/uL — ABNORMAL HIGH (ref 4.0–10.5)
nRBC: 0 % (ref 0.0–0.2)

## 2022-02-11 LAB — GLUCOSE, CAPILLARY
Glucose-Capillary: 307 mg/dL — ABNORMAL HIGH (ref 70–99)
Glucose-Capillary: 338 mg/dL — ABNORMAL HIGH (ref 70–99)
Glucose-Capillary: 123 mg/dL — ABNORMAL HIGH (ref 70–99)
Glucose-Capillary: 207 mg/dL — ABNORMAL HIGH (ref 70–99)

## 2022-02-11 LAB — MAGNESIUM: Magnesium: 1.8 mg/dL (ref 1.7–2.4)

## 2022-02-11 MED ORDER — MICONAZOLE NITRATE 2 % VA CREA
1.0000 | TOPICAL_CREAM | Freq: Every day | VAGINAL | 1 refills | Status: AC
  Filled 2022-02-11: qty 45, fill #0
  Filled 2022-02-11: qty 45, 7d supply, fill #0

## 2022-02-11 MED ORDER — INSULIN ASPART 100 UNIT/ML IJ SOLN
5.0000 [IU] | Freq: Three times a day (TID) | INTRAMUSCULAR | Status: DC
  Administered 2022-02-11 (×2): 5 [IU] via SUBCUTANEOUS

## 2022-02-11 MED ORDER — SENNOSIDES-DOCUSATE SODIUM 8.6-50 MG PO TABS: 1.0000 | ORAL_TABLET | Freq: Two times a day (BID) | ORAL | Status: DC

## 2022-02-11 MED ORDER — CLOTRIMAZOLE 1 % EX CREA
TOPICAL_CREAM | Freq: Two times a day (BID) | CUTANEOUS | 1 refills | Status: AC
  Filled 2022-02-11: qty 28, 7d supply, fill #0

## 2022-02-11 MED ORDER — METOLAZONE 2.5 MG PO TABS
2.5000 mg | ORAL_TABLET | Freq: Once | ORAL | Status: AC
  Administered 2022-02-11: 2.5 mg via ORAL
  Filled 2022-02-11: qty 1

## 2022-02-11 NOTE — Progress Notes (Signed)
PROGRESS NOTE    Susan [name]  NLG:[id number] DOB: [birth year] DOA: 01/31/2022 PCP: [name], MD    Chief Complaint  Patient presents with   Abdominal Pain    Emesis/Diarrhea    Brief Narrative:  08XK with hx systolic chf, DM, CKD 4, afib on eliquis presented with epigastric pain with n/v admitted for acute gallstone pancreatitis.  General surgery, GI, heart failure team was consulted and followed the patient during this hospitalization.   Assessment & Plan:   Principal Problem:   Acute pancreatitis Active Problems:   Hyperlipemia   Chronic HFrEF (heart failure with reduced ejection fraction) (HCC)   CKD (chronic kidney disease), stage IV (HCC)   Nausea & vomiting   Hypothyroidism   Type 2 diabetes mellitus with diabetic polyneuropathy, with long-term current use of insulin (HCC)   Abdominal pain   SIRS (systemic inflammatory response syndrome) (HCC)   Transaminitis   History of anemia due to chronic kidney disease   Paroxysmal atrial fibrillation (HCC)   Bacteremia   Sepsis without acute organ dysfunction (HCC)  #1 acute gallstone pancreatitis -Patient presented with epigastric abdominal pain, nausea vomiting, lipase levels noted to be elevated at 1341. -Abdominal ultrasound done with gallstones with prominent CBD, no cholecystitis noted.   -Patient seen in consultation by GI, underwent MRCP which showed choledocholithiasis with mild pancreatitis 2 mm stone in the dependent portion of the distal common bile duct and gentle tapering of the duct at the level of the ampulla. -Patient subsequently underwent ERCP on 02/03/2022 consistent with choledocholithiasis, patient underwent biliary sphincterotomy and balloon extraction. -Patient seen in consultation by general surgery with no plans for laparoscopic cholecystectomy during this hospitalization as patient noted not to have an acute infection noted to be high surgical risk for cardiac complication from cardiac standpoint  with CKD. -Continue home regimen ursodiol. -Continue IV antibiotics for bacteremia. -ID following, GI following, general surgery following. -Diet advanced per general surgery.  2.  Sepsis with Enterococcus bacteremia, POA -On presentation patient noted to have a leukocytosis white count of 20.1, interval increase compared to 2 days ago, mild tachycardia. -Blood cultures obtained positive for Enterococcus with repeat blood cultures pending with no growth to date. -Patient seen by ID, initial recommendations was to come continue ampicillin and follow-up on TEE. -TEE done per cardiology 02/04/2022 negative for endocarditis. -IV antibiotics switched to IV vancomycin per ID and subsequently placed on IV daptomycin, who are following. -Per ID patient will need PICC line placed if repeat blood cultures are negative x72 hours and will need a 2-week course of IV antibiotics from negative culture, initially early on during the hospitalization. -Patient with CKD stage IV, discussed with primary nephrologist, Dr. Hollie Salk who strongly discouraged use placement of PICC line on this patient as patient will likely be on hemodialysis in the near future and recommending single-lumen tunneled central line if long-term IV antibiotics are needed. -Due to restrictions with PICC placement, secondary to CKD, ID recommending initiation of  linezolid on discharge for 2 more weeks from negative culture with end of therapy 02/16/2022. -Continue IV daptomycin while in-house. -Will need outpatient follow-up with ID which has been arranged. -Per ID.  3.  Chronic biventricular systolic heart failure -Diuretics initially were on hold.  -Due to CKD stage IV noted not on ACE inhibitor or ARB. -Patient with some diffuse scattered crackles on 02/05/2022 which has subsequently improved patient currently euvolemic on examination. -IV fluids stopped. -Demadex resumed initially at 40 mg daily and uptitrated to a  home regimen of 80 mg  twice daily.   -Patient with diffuse crackles noted on examination this morning, continue Demadex 80 mg twice daily, resume home regimen metolazone 2.5 mg every other day.   -We will have heart failure team reassess patient for volume status, may need IV Lasix however will defer to heart failure team.    4.  Poorly controlled type 2 diabetes mellitus -Most recent hemoglobin A1c > 15.5 (01/20/2022) -CBG of 123 this morning. -Continue Levemir to 15 units twice daily.  SSI. -Increase meal coverage NovoLog to 5 units 3 times daily with meals.  -Patient noted to be on Januvia prior to admission however given presentation of pancreatitis will likely not resume on discharge. -Diabetes coordinator following. -Outpatient follow-up with PCP/endocrinologist. -  5.  Hypothyroidism -Synthroid.  6.  Dysuria/?  UTI -Patient complains of some dysuria and suprapubic abdominal pain. -Urinalysis done concerning for UTI. -Unsure as to whether urine cultures were actually sent as results are still pending. -Status post 3 days IV Rocephin.   -Patient currently on daptomycin.    7.  CKD stage IV -Stable. -Creatinine slowly trending down.  Monitor with diuresis.  8.  Anemia of chronic kidney disease -Hemoglobin stable at 10.5.  9.  Hyperlipidemia -Statin on hold, resume on discharge.    10.  Paroxysmal atrial fibrillation, CHA2DS2VASC score 6 -Continue amiodarone for rate control.   -Patient cleared by general surgery for resumption of Eliquis which was started 02/05/2022.  11.  Yeast infection -Patient with complaints of vaginal itching, per RN patient noted to have Candida infection in the perineal region. -Continue clotrimazole cream, Diflucan 150 mg p.o. x1, clotrimazole vaginal troches.    DVT prophylaxis: Eliquis Code Status: Full Family Communication:  Disposition: Likely home when clinically improved, tolerating current diet, and cleared by cardiology due to concerns for volume overload  hopefully in the next 24 to 48 hours.  Status is: Inpatient Remains inpatient appropriate because: Severity of illness   Consultants:  Gastroenterology: Dr. Silverio Decamp 02/01/2022 ID: Dr. Tommy Medal 02/02/2022 General surgery: Dr. Rosendo Gros 02/04/2022  Procedures:  Chest x-ray 02/01/2022 ERCP 02/03/2022 per Dr. Benson Norway TEE 02/04/2022 per Dr. Haroldine Laws MRCP 02/01/2022 Renal ultrasound 02/02/2022 Right upper quadrant ultrasound 02/01/2022 2D echo 02/02/2022 Bilateral upper extremity Dopplers 01/30/2022  Antimicrobials:  IV ampicillin 02/01/2022>>> 02/02/2022 IV vancomycin 02/02/2022 >>>> 02/05/2022 IV Rocephin 02/05/2022>>>> 02/08/2022 IV daptomycin 02/06/2022>>>>> 02/16/2022   Subjective: Laying in bed.  Sleeping but easily arousable.  Stated required oxygen last night, states she is on chronic home O2 at bedtime.  Denies any chest pain.  No nausea or vomiting.  No abdominal pain.  Vaginal itching improved over the past 24 hours.    Objective: Vitals:   02/11/22 0358 02/11/22 0500 02/11/22 0637 02/11/22 0736  BP: 112/74  (!) 142/95 (!) 143/100  Pulse: 90  99 (!) 102  Resp: '16  14 20  '$ Temp: 98.3 F (36.8 C)  98.6 F (37 C) 98.6 F (37 C)  TempSrc: Oral  Oral Oral  SpO2: 96%  95% 93%  Weight:  107 kg    Height:        Intake/Output Summary (Last 24 hours) at 02/11/2022 1032 Last data filed at 02/11/2022 0734 Gross per 24 hour  Intake 440 ml  Output 2700 ml  Net -2260 ml    Filed Weights   02/09/22 0500 02/10/22 0637 02/11/22 0500  Weight: 112.3 kg 108.7 kg 107 kg    Examination:  General exam: NAD. Respiratory system: Diffuse crackles  noted.  No wheezing.  Fair air movement.  No rhonchi.  Speaking in full sentences.  Cardiovascular system: RRR no murmurs rubs or gallops.  No JVD.  No lower extremity edema.  Gastrointestinal system: Abdomen is soft, nontender, nondistended, positive bowel sounds.  No rebound.  No guarding.   Central nervous system: Alert and oriented. No focal neurological  deficits. Extremities: Symmetric 5 x 5 power. Skin: No rashes, lesions or ulcers Psychiatry: Judgement and insight appear normal. Mood & affect appropriate.     Data Reviewed: I have personally reviewed following labs and imaging studies  CBC: Recent Labs  Lab 02/07/22 0813 02/08/22 0438 02/09/22 0355 02/10/22 1016 02/11/22 0640  WBC 6.7 4.9 5.6 5.9 12.0*  NEUTROABS  --   --   --  4.3  --   HGB 9.4* 8.9* 8.9* 10.0* 10.5*  HCT 30.2* 28.2* 28.8* 31.8* 32.6*  MCV 85.8 86.8 86.2 85.7 84.5  PLT 143* 140* 152 180 207     Basic Metabolic Panel: Recent Labs  Lab 02/06/22 0543 02/07/22 0813 02/08/22 0438 02/09/22 0355 02/10/22 1016 02/11/22 0640  NA 133* 137 135 135 137 139  K 4.5 4.6 4.5 4.2 4.7 4.3  CL 97* 100 99 101 100 101  CO2 '26 25 26 26 25 27  '$ GLUCOSE 197* 110* 156* 172* 284* 112*  BUN 82* 77* 75* 71* 69* 68*  CREATININE 3.29* 3.12* 3.06* 2.94* 3.19* 2.92*  CALCIUM 8.3* 8.6* 8.6* 8.7* 9.4 9.7  MG 1.9 2.2  --   --  1.9 1.8  PHOS  --   --   --   --  3.8 3.2     GFR: Estimated Creatinine Clearance: 21.6 mL/min (A) (by C-G formula based on SCr of 2.92 mg/dL (H)).  Liver Function Tests: Recent Labs  Lab 02/10/22 1016 02/11/22 0640  ALBUMIN 2.6* 2.8*     CBG: Recent Labs  Lab 02/10/22 0606 02/10/22 1120 02/10/22 1700 02/10/22 2112 02/11/22 0635  GLUCAP 185* 281* 307* 338* 123*      Recent Results (from the past 240 hour(s))  Culture, blood (Routine X 2) w Reflex to ID Panel     Status: None   Collection Time: 02/03/22  9:39 AM   Specimen: BLOOD RIGHT WRIST  Result Value Ref Range Status   Specimen Description BLOOD RIGHT WRIST  Final   Special Requests   Final    BOTTLES DRAWN AEROBIC AND ANAEROBIC Blood Culture adequate volume   Culture   Final    NO GROWTH 5 DAYS Performed at [facility], [address], [place], [place] [address]    Report Status 02/08/2022 FINAL  Final  Culture, blood (Routine X 2) w Reflex to ID Panel      Status: None   Collection Time: 02/03/22  9:45 AM   Specimen: BLOOD RIGHT FOREARM  Result Value Ref Range Status   Specimen Description BLOOD RIGHT FOREARM  Final   Special Requests   Final    BOTTLES DRAWN AEROBIC AND ANAEROBIC Blood Culture adequate volume   Culture   Final    NO GROWTH 5 DAYS Performed at [facility], [place] [address] [place], [place] [address]    Report Status 02/08/2022 FINAL  Final         Radiology Studies: No results found.      Scheduled Meds:  amiodarone  200 mg Oral Daily   apixaban  5 mg Oral BID   aspirin EC  81 mg Oral  Q breakfast   bisacodyl  10 mg Rectal Once   carvedilol  6.25 mg Oral BID WC   clotrimazole  1 Applicatorful Vaginal QHS   clotrimazole   Topical BID   insulin aspart  0-9 Units Subcutaneous TID AC & HS   insulin aspart  5 Units Subcutaneous TID WC   insulin detemir  15 Units Subcutaneous BID   levothyroxine  50 mcg Oral QAC breakfast   magnesium oxide  400 mg Oral Daily   polyethylene glycol  17 g Oral BID   pregabalin  150 mg Oral BID   senna-docusate  1 tablet Oral BID   sodium chloride flush  3 mL Intravenous Q12H   torsemide  80 mg Oral BID   ursodiol  600 mg Oral TID   Continuous Infusions:  sodium chloride     DAPTOmycin (CUBICIN) 800 mg in sodium chloride 0.9 % IVPB 800 mg (02/10/22 2202)     LOS: 10 days    Time spent: 40 minutes    Irine Seal, MD [facility]   To contact the attending provider between 7A-7P or the covering provider during after hours 7P-7A, please log into the web site [email or web address] and access using universal [place] password for that web site. If you do not have the password, please call the hospital operator.  02/11/2022, 10:32 AM

## 2022-02-11 NOTE — Progress Notes (Signed)
Mobility Specialist Progress Note    02/11/22 1101  Mobility  Activity Ambulated with assistance in hallway  Level of Assistance Moderate assist, patient does 50-74%  Assistive Device Front wheel walker  Distance Ambulated (ft) 120 ft  Activity Response Tolerated fair  $Mobility charge 1 Mobility   Pre-Mobility: 101 HR, 86% SpO2 on RA During Mobility: >/=90% SpO2 on 2LO2 Post-Mobility: 98 HR, 93% SpO2 on 1.5LO2  Pt received in bed and agreeable. C/o not feeling well. Encouraged pursed lip breathing. Needed 2LO2 to maintain SpO2 >/=90%. Returned to chair with call bell in reach and RN present.    Hildred Alamin Mobility Specialist

## 2022-02-11 NOTE — Progress Notes (Signed)
REDS Clip  READING= 31%  Antonietta Jewel, PharmD, [place] Clinical Pharmacist  Phone: [phone] 02/11/2022 1:07 PM  Please check AMION for all [place] phone numbers After 10:00 PM, call [place] [phone]

## 2022-02-11 NOTE — Inpatient Diabetes Management (Signed)
Inpatient Diabetes Program Recommendations  AACE/ADA: New Consensus Statement on Inpatient Glycemic Control  Target Ranges:  Prepandial:   less than 140 mg/dL      Peak postprandial:   less than 180 mg/dL (1-2 hours)      Critically ill patients:  140 - 180 mg/dL    Latest Reference Range & Units 02/10/22 06:06 02/10/22 11:20 02/10/22 17:00 02/10/22 21:12 02/11/22 06:35  Glucose-Capillary 70 - 99 mg/dL 185 (H) 281 (H) 307 (H) 338 (H) 123 (H)   Review of Glycemic Control  Current orders for Inpatient glycemic control: Levemir 15 units BID, Novolog 0-9 units AC&HS, Novolog 3 units TID with meals   Inpatient Diabetes Program Recommendations:     Insulin: Please consider increasing meal coverage to Novolog 5 units TID with meals if patient eats at least 50% of meals.  Thanks, Barnie Alderman, RN, MSN, [place] Diabetes Coordinator Inpatient Diabetes Program [phone] (Team Pager from 8am to [place])

## 2022-02-11 NOTE — Progress Notes (Addendum)
Advanced Heart Failure Rounding Note  PCP-Cardiologist: Glori Bickers, MD    Patient Profile   71 y.o. female with history of chronic biventricular CHF, CAD, morbid obesity, DM II, CKD 4, PAF/AFL. Admitted with gallstone pancreatitis and CBD stone s/p extraction in addition to Enterococcus faecalis and E. Faecium bacteremia. TEE was negative for endocarditis, LVEF 30%, RV mod HK w/ severe TR. AHF team was initially consulted 02/04/22 for surgical clearance, however GS opted on medical management. AHF team now asked to revisit to help w/ volume management. Diuretics had been held by primary team for several days given low BP. BP now improved, PO diuretics just resumed and felt to be volume overloaded.   Subjective:    She felt SOB while ambulating w/ mobility specialist today. O2 sats dropped to 86% on RA. Transitioned to [place] 2L/ min. Primary team gave 2.5 mg of metolazone w/ am dose of torsemide. She reports good UOP thus far.   Now resting in chair, on 1.5L [place] currently, O2 sats 96%.   Wt trending back down since restart of PO diuretics. Now 2 lb below admission wt. No chest pain.   Stable from GI standpoint. No abdominal pain. Tolerating diet ok.   Scr 3.19>>2.92  K 4.3   Objective:   Weight Range: 107 kg Body mass index is 38.07 kg/m.   Vital Signs:   Temp:  [97.5 F (36.4 C)-98.6 F (37 C)] 98.4 F (36.9 C) (09/19 1108) Pulse Rate:  [75-102] 92 (09/19 1108) Resp:  [14-22] 16 (09/19 1108) BP: (101-151)/(69-100) 101/79 (09/19 1108) SpO2:  [93 %-97 %] 96 % (09/19 1108) Weight:  [107 kg] 107 kg (09/19 0500) Last BM Date : 02/08/22  Weight change: Filed Weights   02/09/22 0500 02/10/22 0637 02/11/22 0500  Weight: 112.3 kg 108.7 kg 107 kg    Intake/Output:   Intake/Output Summary (Last 24 hours) at 02/11/2022 1157 Last data filed at 02/11/2022 1110 Gross per 24 hour  Intake 440 ml  Output 3000 ml  Net -2560 ml      Physical Exam    General:  Well  appearing. No resp difficulty HEENT: Normal Neck: Supple. JVD 8 cm Carotids 2+ bilat; no bruits. No lymphadenopathy or thyromegaly appreciated. Cor: PMI nondisplaced. Regular rate & rhythm. No rubs, gallops or murmurs. Lungs: faint basilar crackles L>R Abdomen: Soft, nontender, nondistended. No hepatosplenomegaly. No bruits or masses. Good bowel sounds. Extremities: No cyanosis, clubbing, rash, trace b/l pretibial  Neuro: Alert & orientedx3, cranial nerves grossly intact. moves all 4 extremities w/o difficulty. Affect pleasant   Telemetry   NSR 80s, personally review   EKG    N/A   Labs    CBC Recent Labs    02/10/22 1016 02/11/22 0640  WBC 5.9 12.0*  NEUTROABS 4.3  --   HGB 10.0* 10.5*  HCT 31.8* 32.6*  MCV 85.7 84.5  PLT 180 250   Basic Metabolic Panel Recent Labs    02/10/22 1016 02/11/22 0640  NA 137 139  K 4.7 4.3  CL 100 101  CO2 25 27  GLUCOSE 284* 112*  BUN 69* 68*  CREATININE 3.19* 2.92*  CALCIUM 9.4 9.7  MG 1.9 1.8  PHOS 3.8 3.2   Liver Function Tests Recent Labs    02/10/22 1016 02/11/22 0640  ALBUMIN 2.6* 2.8*   No results for input(s): "LIPASE", "AMYLASE" in the last 72 hours. Cardiac Enzymes No results for input(s): "CKTOTAL", "CKMB", "CKMBINDEX", "TROPONINI" in the last 72 hours.  BNP: BNP (  last 3 results) Recent Labs    01/15/22 0132 01/29/22 1147 02/01/22 0615  BNP 1,929.8* 891.1* 451.5*    ProBNP (last 3 results) No results for input(s): "PROBNP" in the last 8760 hours.   D-Dimer No results for input(s): "DDIMER" in the last 72 hours. Hemoglobin A1C No results for input(s): "HGBA1C" in the last 72 hours. Fasting Lipid Panel No results for input(s): "CHOL", "HDL", "LDLCALC", "TRIG", "CHOLHDL", "LDLDIRECT" in the last 72 hours. Thyroid Function Tests No results for input(s): "TSH", "T4TOTAL", "T3FREE", "THYROIDAB" in the last 72 hours.  Invalid input(s): "FREET3"  Other results:   Imaging    No results  found.   Medications:     Scheduled Medications:  amiodarone  200 mg Oral Daily   apixaban  5 mg Oral BID   aspirin EC  81 mg Oral Q breakfast   bisacodyl  10 mg Rectal Once   carvedilol  6.25 mg Oral BID WC   clotrimazole  1 Applicatorful Vaginal QHS   clotrimazole   Topical BID   insulin aspart  0-9 Units Subcutaneous TID AC & HS   insulin aspart  5 Units Subcutaneous TID WC   insulin detemir  15 Units Subcutaneous BID   levothyroxine  50 mcg Oral QAC breakfast   magnesium oxide  400 mg Oral Daily   polyethylene glycol  17 g Oral BID   pregabalin  150 mg Oral BID   senna-docusate  1 tablet Oral BID   sodium chloride flush  3 mL Intravenous Q12H   torsemide  80 mg Oral BID   ursodiol  600 mg Oral TID    Infusions:  sodium chloride     DAPTOmycin (CUBICIN) 800 mg in sodium chloride 0.9 % IVPB 800 mg (02/10/22 2202)    PRN Medications: sodium chloride, acetaminophen **OR** acetaminophen, fentaNYL (SUBLIMAZE) injection, loperamide, LORazepam, naLOXone (NARCAN)  injection, nystatin, mouth rinse, polyethylene glycol, prochlorperazine, sodium chloride flush    Patient Profile   71 y.o. female with history of chronic biventricular CHF, CAD, morbid obesity, DM II, CKD 4, PAF/AFL. Admitted with gallstone pancreatitis and CBD stone s/p extraction in addition to Enterococcus faecalis and E. Faecium bacteremia. TEE was negative for endocarditis, LVEF 30%, RV mod HK w/ severe TR. AHF team was initially consulted 02/04/22 for surgical clearance, however GS opted on medical management. AHF team now asked to revisit to help w/ volume management. Diuretics had been held by primary team for several days given low BP. BP improved, PO diuretics just resumed and felt to be volume overloaded.   Assessment/Plan   Chronic biventricular CHF - Echo (9/21): EF 55-60%, RV normal  - Echo (10/22): EF down to 30-35% in setting of NSTEMI, Grade II DD. - Echo (12/10/21): EF 25-30%, RV mildly reduced. -  Ltd Echo (8/23): EF 30-35%, RV moderately reduced - Echo this admit: EF 30%, interventricular septum flattened in diastole consistent with RV volume overload, RV low normal, severe TR - Diuretics held early during admission in the setting of hypotension/infection. BP improved, PO diuretics resumed but w/ mild fluid overload on exam + mild exertional hypoxia w/ ambulation today. She reports good subjective response to dose of metolazone that was given this morning w/ 80 mg of torsemide  - Will check ReDS. If significant elevation suggestive of pulmonary edema, can give PM dose of IV Lasix. We will follow closely w/ you.   - get back on previous metolazone regimen, was taking 2.5 mg every other week PTA  - Continue  Coreg 6.25 BID - GDMT has been limited by hx hypotension and CKD   2. Gallstone pancreatitis - Lipase 1341>112>60 - s/p ERCP on 09/11 with biliary sphincterotomy and balloon extraction - No evidence of cholecystitis on imaging - Her surgical risk for cardiac complications and progression of CKD to point of dialysis is high. Fortunately, she seems to be improving with current treatment. D/w GS. No plans for surgery. Continue conservative management    3. Enterococcus faecalis and E faecium bacteremia - Repeat BC X 2 09/11 NGTD - GI source suspected - No evidence of endocarditis on TEE - ID following - IV antibiotics switched to IV vancomycin per ID and subsequently placed on IV daptomycin - Due to restrictions with PICC placement, secondary to CKD, ID recommending initiation of  linezolid on discharge for 2 more weeks from negative culture with end of therapy 02/16/2022. - Continue IV daptomycin while in-house.      4. Paroxysmal AF/AFL - New during 8/23 admit - Currently SR. Continue amiodarone 200 mg daily - Eliquis 5 mg bid    5. AKI on CKD IV - Baseline Scr ~ 3.5 - Scr bumped to 4 this admit, improved  2.92 today  - follow BMP  - followed by outpatient nephrology    6.  Uncontrolled DM II - Hgb A1c 15 in 08/23 - No SGLT2i given poor control  - Management per primary team   7. Anemia of CKD - Hgb stable 10.5   Will check ReDs and will provide further recs regarding diuretics. Anticipate home home soon, in next 24-48 hrs. We will make sure she has post hospital f/u in the [facility].     Length of Stay: [address] PA-C  02/11/2022, 11:57 AM  Advanced Heart Failure Team Pager [phone] (M-F; 7a - 5p)  Please contact [facility] for night-coverage after hours (5p -7a ) and weekends on [email or web address]   Patient seen and examined with the above-signed Advanced Practice Provider and/or Housestaff. I personally reviewed laboratory data, imaging studies and relevant notes. I independently examined the patient and formulated the important aspects of the plan. I have edited the note to reflect any of my changes or salient points. I have personally discussed the plan with the patient and/or family.  Feeling better after increase in diuretics. Denies orthopnea or PND. Scr improving. ReDS lung water 31% (normal 20-35%)  Belly pain resolved.  General:  Sitting up. No resp difficulty HEENT: normal Neck: supple. JVP hard to see  Carotids 2+ bilat; no bruits. No lymphadenopathy or thryomegaly appreciated. Cor: PMI nondisplaced. Regular rate & rhythm. 2/6 TR Lungs: clear Abdomen: obese soft, nontender, nondistended. No hepatosplenomegaly. No bruits or masses. Good bowel sounds. Extremities: no cyanosis, clubbing, rash, tr edema Neuro: alert & orientedx3, cranial nerves grossly intact. moves all 4 extremities w/o difficulty. Affect pleasant  Volume status looks good. Ready for d/c from cardiac standpoint on previous home cardiac meds. D/w Dr. Grandville Silos.  Glori Bickers, MD  3:02 PM

## 2022-02-11 NOTE — Progress Notes (Signed)
Meds from  main pharm and TOC pharm. Given to pt. All set for discharge awaiting . Discharge instructions  given to pt and husband.

## 2022-02-11 NOTE — Progress Notes (Signed)
SATURATION QUALIFICATIONS: (This note is used to comply with regulatory documentation for home oxygen)  Patient Saturations on Room Air at Rest = 88%  Patient Saturations on Room Air while Ambulating = 84%  Patient Saturations on 2 Liters of oxygen while Ambulating = 90%

## 2022-02-11 NOTE — Discharge Summary (Signed)
Physician Discharge Summary  Cortlynn Hollinsworth ESP:[id number] DOB: [birth year] DOA: 01/31/2022  PCP: [name], MD  Admit date: 01/31/2022 Discharge date: 02/11/2022  Time spent: 60 minutes  Recommendations for Outpatient Follow-up:  Follow-up with Dr. Rosendo Gros, general surgery as needed for gallbladder symptoms. Follow-up with Dr. Haroldine Laws, cardiology on 02/25/2022 at 10:30 AM. Follow-up with [name], MD in 2 weeks.  On follow-up patient will need a basic metabolic profile done to follow-up on electrolytes and renal function.  Patient's diabetes will need to be reassessed on follow-up. Follow-up with Dr. Candiss Norse, ID on 02/17/2022 as scheduled.   Discharge Diagnoses:  Principal Problem:   Acute pancreatitis Active Problems:   Hyperlipemia   Chronic HFrEF (heart failure with reduced ejection fraction) (HCC)   CKD (chronic kidney disease), stage IV (HCC)   Nausea & vomiting   Hypothyroidism   Type 2 diabetes mellitus with diabetic polyneuropathy, with long-term current use of insulin (HCC)   Abdominal pain   SIRS (systemic inflammatory response syndrome) (HCC)   Transaminitis   History of anemia due to chronic kidney disease   Paroxysmal atrial fibrillation ([place])   Bacteremia   Sepsis without acute organ dysfunction [facility])   Discharge Condition: Stable and improved  Diet recommendation: Heart healthy diet  Filed Weights   02/09/22 0500 02/10/22 0637 02/11/22 0500  Weight: 112.3 kg 108.7 kg 107 kg    History of present illness:  HPI per Dr. Velia Meyer : Susan [name] is a 71 y.o. female with medical history significant for chronic biventricular systolic heart failure with LVEF 30 to 35%, stage IV CKD with baseline creatinine range 3.0-3.8, anemia of chronic kidney disease with baseline hemoglobin 10-12, poorly controlled type 2 diabetes mellitus, hypertension, hyperlipidemia, paroxysmal atrial fibrillation chronically anticoagulated on Eliquis, chronic hypoxic respiratory  failure on 2 L nasal cannula nightly, who is admitted to [facility] on 01/31/2022 with suspected acute pancreatitis after presenting from home to [facility] ED complaining of abdominal pain.    Of note, the patient was recently hospitalized at [facility] from 01/14/2022 to 01/23/22 for DKA with hospital course complicated by acute on chronic biventricular systolic heart failure, atrial fibrillation with RVR.  She was subsequently discharged to home with home health from the hospitalist service on 01/23/2022.    She presents to [facility] emergency department this evening complaining of 2 to 3 days of new onset epigastric discomfort, which she describes as sharp, with radiation into the bilateral upper abdominal quadrants.  Has progressed in severity since onset, is now nearly constant, with postprandial exacerbation as well as exacerbation with palpation over the abdomen.  No preceding trauma.  This abdominal pain is associated with recurrent nausea resulting in at least 3-4 daily episodes of nonbloody, nonbilious emesis over that timeframe, with most recent such episode occurring just prior to presenting to [facility] emergency department this evening.  She notes consequential decline in oral intake of food and water over that time.  She also notes new onset loose stool over the last 2 to 3 days, reporting 1-2 such episodes per day over that timeframe, in the absence of any melena or hematochezia.  No recent travel.  Denies any associated subjective fever, chills, rigors, or generalized myalgias.   Not associate with any new onset rash, neck stiffness, shortness of breath, cough, dysuria or gross hematuria.  She also denies any recent chest pain, palpitations, diaphoresis.    Denies any routine or recent alcohol consumption, nor any use of recreational drugs.  Outpatient  medications notable for Januvia in the setting of poorly controlled type 2 diabetes mellitus.  Does not take Depakote nor any recent systemic  corticosteroids.   Medical history notable for chronic biventricular systolic heart failure, with most recent echocardiogram on 01/17/2022 showing LVEF 30 to 35%, indeterminate diastolic parameters, moderately reduced right ventricular systolic function, mild to moderate tricuspid regurgitation, and trivial mitral regurgitation.   She also has a history of paroxysmal atrial fibrillation for which she is anticoagulated on Eliquis.  Outpatient medications include Coreg as well as amiodarone.  Per chart review, she also has a history of stage IV CKD with baseline creatinine range 3.0-3.8.  History of chronic hypoxic respiratory failure is noted, with the patient on 2 L nasal cannula that appears limited to nocturnal timeframe.   Per chart review, most recent liver enzymes, which were drawn on 01/14/2022, were notable for the following: Alkaline phosphatase 103, AST 28, ALT 24, total bilirubin 1.9.  Additionally, most recent prior lipase, also drawn on 01/14/2022 was noted to be 38.         ED Course:  Vital signs in the ED were notable for the following: Afebrile; heart rate 87-98; blood pressure 159/67; respiratory rate 15-20; oxygen saturation 94 to 100% on her baseline 2 L nasal cannula nocturnal.    Labs were notable for the following: CMP notable for the following: Sodium 135,   At 25, creatinine 3.69, glucose 287, calcium, adjusted for mild hypoalbuminemia noted to be approximately 10.0, albumin 2.8, alkaline phosphatase 404, AST 242, ALT 79, total bilirubin 3.2.  Lipase 1341.  CBC notable for white blood cell count 20,100 relative to most recent prior white blood cell count of 11,600 on 01/29/2022, hemoglobin 11.56 with normocytic/normochromic findings, platelet count 278.  Urinalysis notable for 6-10 white blood cells, leukocyte esterase/nitrate negative.  Blood cultures x2 collected.   Imaging and additional notable ED work-up: EDP is ordered abdominal ultrasound, with result currently pending.    EDP has notified [facility], Dr. Havery Moros, Of consult request for acute pancreatitis concerning for acute biliary source.    Subsequently, the patient was admitted for further evaluation and management of suspected acute pancreatitis, potentially on the basis of acute biliary pancreatitis given evidence of mild transaminitis with potential corresponding cholestatic pattern, with result of abdominal ultrasound currently pending, presenting with 2 to 3 days of new onset epigastric pain associate with nausea, vomiting, loose stool.     Hospital Course:  #1 acute gallstone pancreatitis -Patient presented with epigastric abdominal pain, nausea vomiting, lipase levels noted to be elevated at 1341. -Abdominal ultrasound done with gallstones with prominent CBD, no cholecystitis noted.   -Patient seen in consultation by GI, underwent MRCP which showed choledocholithiasis with mild pancreatitis 2 mm stone in the dependent portion of the distal common bile duct and gentle tapering of the duct at the level of the ampulla. -Patient subsequently underwent ERCP on 02/03/2022 consistent with choledocholithiasis, patient underwent biliary sphincterotomy and balloon extraction. -Patient seen in consultation by general surgery with no plans for laparoscopic cholecystectomy during this hospitalization as patient noted not to have an acute infection noted to be high surgical risk for cardiac complication from cardiac standpoint with CKD. -Patient was resumed back on home regimen ursodiol. -Patient maintained on IV antibiotics secondary to Enterococcus bacteremia as noted in problem #2.   -Patient be discharged in stable and improved condition with outpatient follow-up with ID. -Follow-up with general surgery as needed.   2.  Sepsis with Enterococcus bacteremia, POA -On  presentation patient noted to have a leukocytosis white count of 20.1, interval increase compared to 2 days ago, mild  tachycardia. -Blood cultures obtained positive for Enterococcus with repeat blood cultures pending with no growth to date. -Patient seen by ID, initial recommendations was to come continue ampicillin and follow-up on TEE. -TEE done per cardiology 02/04/2022 negative for endocarditis. -IV antibiotics switched to IV vancomycin per ID and subsequently placed on IV daptomycin. -Per ID patient will need PICC line placed if repeat blood cultures are negative x72 hours and will need a 2-week course of IV antibiotics from negative culture, initially early on during the hospitalization. -Patient with CKD stage IV, discussed with primary nephrologist, Dr. Hollie Salk who strongly discouraged use placement of PICC line on this patient as patient will likely be on hemodialysis in the near future and recommending single-lumen tunneled central line if long-term IV antibiotics are needed. -Due to restrictions with PICC placement, secondary to CKD, ID recommending initiation of  linezolid on discharge for 2 more weeks from negative culture with end of therapy 02/16/2022. -Patient be discharged in stable condition with outpatient follow-up with ID which has been arranged.   3.  Chronic biventricular systolic heart failure -Diuretics initially were on hold.  -Due to CKD stage IV noted not on ACE inhibitor or ARB. -Patient with some diffuse scattered crackles on 02/05/2022 which has subsequently improved initially, patient noted to have some diffuse crackles noted on day of discharge 02/11/2022.   -Patient initially resumed on Demadex 40 mg daily which was further uptitrated to home regimen of 80 mg twice daily.   -Home regimen metolazone resumed at 2.5 mg every other week on day of discharge.  -Heart failure reassessed patient on day of discharge, ReDs clip reading of 31%, heart failure team felt patient's volume status looks good and cleared patient for discharge from a cardiac standpoint with resumption of previous home  cardiac meds and close outpatient follow-up which was arranged prior to discharge.  -Patient was discharged in stable condition.     4.  Poorly controlled type 2 diabetes mellitus -Most recent hemoglobin A1c > 15.5 (01/20/2022) -Patient maintained on home regimen Levemir 15 units twice daily as well as sliding scale insulin and meal coverage NovoLog 5 units 3 times daily with meals.  -Patient noted to be on Januvia prior to admission however given presentation of pancreatitis will not resume on discharge. -Diabetes coordinator following. -Outpatient follow-up with PCP/endocrinologist. -   5.  Hypothyroidism -Patient maintained on home regimen Synthroid.   6.  Dysuria/?  UTI -Patient complains of some dysuria and suprapubic abdominal pain. -Urinalysis done concerning for UTI. -Unsure as to whether urine cultures were actually sent as results are still pending at time of discharge.. -Status post 3 days IV Rocephin.     7.  CKD stage IV -Stable. -Outpatient follow-up with nephrology as previously scheduled.   8.  Anemia of chronic kidney disease -Hemoglobin stable at 10.5 by day of discharge.   9.  Hyperlipidemia -Statin was held during the hospitalization and will be resumed on discharge.     10.  Paroxysmal atrial fibrillation, CHA2DS2VASC score 6 -Patient was maintained on amiodarone for rate control.   -Patient cleared by general surgery for resumption of Eliquis which was started 02/05/2022. -Outpatient follow-up with cardiology.   11.  Yeast infection -Patient with complaints of vaginal itching, per RN patient noted to have Candida infection in the perineal region. -Patient received Diflucan 150 mg p.o. x1, placed on clotrimazole cream  as well as clotrimazole vaginal cultures with clinical improvement.   -Outpatient follow-up with PCP.     Procedures: Chest x-ray 02/01/2022 ERCP 02/03/2022 per Dr. Benson Norway TEE 02/04/2022 per Dr. Haroldine Laws MRCP 02/01/2022 Renal ultrasound  02/02/2022 Right upper quadrant ultrasound 02/01/2022 2D echo 02/02/2022 Bilateral upper extremity Dopplers 01/30/2022  Consultations: Gastroenterology: Dr. Silverio Decamp 02/01/2022 ID: Dr. Tommy Medal 02/02/2022 General surgery: Dr. Rosendo Gros 02/04/2022    Discharge Exam: Vitals:   02/11/22 0736 02/11/22 1108  BP: (!) 143/100 101/79  Pulse: (!) 102 92  Resp: 20 16  Temp: 98.6 F (37 C) 98.4 F (36.9 C)  SpO2: 93% 96%    General: NAD Cardiovascular: RRR no murmurs rubs or gallops. Respiratory: Scattered crackles noted.  No wheezing.  Fair air movement.  Speaking in full sentences.  Discharge Instructions   Discharge Instructions     Diet - low sodium heart healthy   Complete by: As directed    Discharge wound care:   Complete by: As directed    Pressure Injury 05/17/20 Buttocks Right;Left Stage 3 -  Full thickness tissue loss. Subcutaneous fat may be visible but bone, tendon or muscle are NOT exposed. Borders pink, wound bed yellow. Left buttocks wound 4cmLength, 1.5cm Width. Right buttocks wou 634 days    Pressure Injury 08/17/20 Buttocks Right Stage 3 -  Full thickness tissue loss. Subcutaneous fat may be visible but bone, tendon or muscle are NOT exposed. 543 days   Pressure Injury 08/17/20 Left Stage 3 -  Full thickness tissue loss. Subcutaneous fat may be visible but bone, tendon or muscle are NOT exposed. 2 areas of healing stage 3 pressure injuries to left buttock 543 days   Pressure Injury 01/16/22 Buttocks Left Deep Tissue Pressure Injury - Purple or maroon localized area of discolored intact skin or blood-filled blister due to damage of underlying soft tissue from pressure and/or shear. purple 25 days   Pressure Injury 01/16/22 Buttocks Right Deep Tissue Pressure Injury - Purple or maroon localized area of discolored intact skin or blood-filled blister due to damage of underlying soft tissue from pressure and/or shear. Purple 25 days     Increase activity slowly   Complete by: As  directed       Allergies as of 02/11/2022       Reactions   Keflex [cephalexin] Diarrhea   Codeine Nausea And Vomiting, Other (See Comments)        Medication List     STOP taking these medications    Januvia 25 MG tablet Generic drug: sitaGLIPtin       TAKE these medications    albuterol 108 (90 Base) MCG/ACT inhaler Commonly known as: VENTOLIN HFA Inhale 1-2 puffs into the lungs every 6 (six) hours as needed for wheezing or shortness of breath.   amiodarone 200 MG tablet Commonly known as: PACERONE Take 1 tablet (200 mg total) by mouth daily.   Antifungal Clotrimazole 1 % cream Generic drug: clotrimazole Apply topically 2 (two) times daily for 7 days.   apixaban 5 MG Tabs tablet Commonly known as: ELIQUIS Take 1 tablet (5 mg total) by mouth 2 (two) times daily.   aspirin EC 81 MG tablet Take 1 tablet (81 mg total) by mouth daily with breakfast.   atorvastatin 80 MG tablet Commonly known as: LIPITOR TAKE 1 TABLET BY MOUTH EVERY DAY   carvedilol 6.25 MG tablet Commonly known as: COREG Take 1 tablet (6.25 mg total) by mouth 2 (two) times daily with a meal.   colchicine  0.6 MG tablet Take 0.6 mg by mouth daily as needed (gout).   diphenoxylate-atropine 2.5-0.025 MG tablet Commonly known as: Lomotil Take 1 tablet by mouth 4 (four) times daily as needed for diarrhea or loose stools.   ferrous sulfate 325 (65 FE) MG tablet Take 325 mg by mouth 2 (two) times daily.   FRUIT & VEGETABLE DAILY PO Take 1 tablet by mouth daily.   isosorbide mononitrate 30 MG 24 hr tablet Commonly known as: IMDUR Take 0.5 tablets (15 mg total) by mouth daily.   Levemir FlexPen 100 UNIT/ML FlexPen Generic drug: insulin detemir Inject 15 Units into the skin 2 (two) times daily.   levothyroxine 50 MCG tablet Commonly known as: SYNTHROID Take 1 tablet (50 mcg total) by mouth daily before breakfast.   linezolid 600 MG tablet Commonly known as: Zyvox Take 1 tablet (600 mg  total) by mouth 2 (two) times daily for 10 days.   magnesium oxide 400 (240 Mg) MG tablet Commonly known as: MAG-OX Take 1 tablet (400 mg total) by mouth daily.   metolazone 2.5 MG tablet Commonly known as: ZAROXOLYN Take 1 tablet (2.5 mg total) by mouth as directed. Every other week   nitroGLYCERIN 0.4 MG SL tablet Commonly known as: NITROSTAT PLACE 1 TABLET (0.4 MG TOTAL) UNDER THE TONGUE EVERY 5 (FIVE) MINUTES AS NEEDED FOR CHEST PAIN.   NovoLOG FlexPen 100 UNIT/ML FlexPen Generic drug: insulin aspart 0-9 Units, Subcutaneous, 3 times daily with meals. CBG 70-120: 0 units CBG 121-150: 1 unit CBG 151-200: 2 units CBG 201-250: 3 units CBG 251-300: 5 units CBG 301-350: 7 units CBG 351-400: 9 units CBG>400: call MD   nystatin powder Commonly known as: MYCOSTATIN/NYSTOP Apply 1 Application topically 2 (two) times daily as needed for irritation.   nystatin cream Commonly known as: MYCOSTATIN Apply 1 Application topically 2 (two) times daily.   OneTouch Delica Plus [id number] Misc Use as directed to check blood sugars as need up to 4 times daily   OneTouch Verio Flex System w/Device Kit Use up to four times daily as directed.   OneTouch Verio test strip Generic drug: glucose blood use as directed to check blood sugars up to 4 times daily   OXYGEN Inhale 2 L/min into the lungs at bedtime.   Pentips 32G X 4 MM Misc Generic drug: Insulin Pen Needle Use as directed   polyethylene glycol 17 g packet Commonly known as: MIRALAX / GLYCOLAX Take 17 g by mouth as needed for mild constipation.   pregabalin 150 MG capsule Commonly known as: LYRICA Take 150 mg by mouth 2 (two) times daily.   senna-docusate 8.6-50 MG tablet Commonly known as: Senokot-S Take 1 tablet by mouth 2 (two) times daily.   torsemide 20 MG tablet Commonly known as: DEMADEX Take 4 tablets (80 mg total) by mouth 2 (two) times daily.   Trintellix 10 MG Tabs tablet Generic drug: vortioxetine HBr TAKE 1  TABLET BY MOUTH EVERY DAY What changed: how much to take   ursodiol 500 MG tablet Commonly known as: ACTIGALL Take 500 mg by mouth 3 (three) times daily.   V-R MICONAZOLE 7 2 % vaginal cream Generic drug: miconazole Place 1 Applicatorful vaginally at bedtime for 7 days.               Discharge Care Instructions  (From admission, onward)           Start     Ordered   02/11/22 0000  Discharge wound care:  Comments: Pressure Injury 05/17/20 Buttocks Right;Left Stage 3 -  Full thickness tissue loss. Subcutaneous fat may be visible but bone, tendon or muscle are NOT exposed. Borders pink, wound bed yellow. Left buttocks wound 4cmLength, 1.5cm Width. Right buttocks wou 634 days    Pressure Injury 08/17/20 Buttocks Right Stage 3 -  Full thickness tissue loss. Subcutaneous fat may be visible but bone, tendon or muscle are NOT exposed. 543 days   Pressure Injury 08/17/20 Left Stage 3 -  Full thickness tissue loss. Subcutaneous fat may be visible but bone, tendon or muscle are NOT exposed. 2 areas of healing stage 3 pressure injuries to left buttock 543 days   Pressure Injury 01/16/22 Buttocks Left Deep Tissue Pressure Injury - Purple or maroon localized area of discolored intact skin or blood-filled blister due to damage of underlying soft tissue from pressure and/or shear. purple 25 days   Pressure Injury 01/16/22 Buttocks Right Deep Tissue Pressure Injury - Purple or maroon localized area of discolored intact skin or blood-filled blister due to damage of underlying soft tissue from pressure and/or shear. Purple 25 days     02/11/22 1556           Allergies  Allergen Reactions   Keflex [Cephalexin] Diarrhea   Codeine Nausea And Vomiting and Other (See Comments)    Follow-up Information     Ralene Ok, MD Follow up.   Specialty: General Surgery Why: As needed for gallbladder symptoms Contact information: [address] [place]  [address] [phone]         [name], MD. Schedule an appointment as soon as possible for a visit in 2 week(s).   Specialty: Family Medicine Contact information: [address] [place] [address] [phone]         Bensimhon, Shaune Pascal, MD .   Specialty: Cardiology Why: 02/25/22 at 10:30 AM   [facility], [place] information: [place] [address] [phone]         [name], MD Follow up on 02/17/2022.   Specialty: Infectious Diseases Why: Follow-up as scheduled. Contact information: [address], [place] [address] [phone]                  The results of significant diagnostics from this hospitalization (including imaging, microbiology, ancillary and laboratory) are listed below for reference.    Significant Diagnostic Studies: [name] EKG SITE RITE  Result Date: 02/06/2022 If Site Rite image not attached, placement could not be confirmed due to current cardiac rhythm.  ECHO TEE  Result Date: 02/04/2022    TRANSESOPHOGEAL ECHO REPORT   Patient Name:   ANJANAE WOEHRLE Date of Exam: 02/04/2022 Medical Rec #:  [id number]    Height:       66.0 in Accession #:    [id number]   Weight:       238.8 lb Date of Birth:  [birth year]    BSA:          2.156 m Patient Age:    30 years     BP:           127/55 mmHg Patient Gender: F            HR:           69 bpm. Exam Location:  Inpatient Procedure: Transesophageal Echo, Color Doppler and Cardiac Doppler Indications:  Bacteremia  History:         Patient has prior history of Echocardiogram examinations, most                  recent 02/02/2022. CHF, Arrythmias:Atrial Fibrillation; Risk                  Factors:Hypertension, Diabetes and Dyslipidemia.  Sonographer:     Raquel Sarna Senior RDCS Referring Phys:  [id number] Burna [name] [facility] Diagnosing Phys: Glori Bickers MD PROCEDURE: After discussion of  the risks and benefits of a TEE, an informed consent was obtained from the patient. The transesophogeal probe was passed without difficulty through the esophogus of the patient. Sedation performed by different physician. The patient was monitored while under deep sedation. Anesthestetic sedation was provided intravenously by Anesthesiology: 279m of Propofol, 1026mof Lidocaine. The patient developed no complications during the procedure. IMPRESSIONS  1. Left ventricular ejection fraction, by estimation, is 30%%. The left ventricle has moderately decreased function.  2. Right ventricular systolic function is moderately reduced. The right ventricular size is moderately enlarged.  3. Left atrial size was moderately dilated. No left atrial/left atrial appendage thrombus was detected.  4. Right atrial size was severely dilated.  5. No vegetation. The mitral valve is normal in structure. Trivial mitral valve regurgitation.  6. No vegetation. Tricuspid valve regurgitation is severe.  7. 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 No vegetation. The aortic valve is tricuspid. There is mild calcification of the aortic valve. Aortic valve regurgitation is not visualized.  8. No vegetation.  9. There is Moderate (Grade III) plaque involving the descending aorta. FINDINGS  Left Ventricle: Left ventricular ejection fraction, by estimation, is 30%%. The left ventricle has moderately decreased function. The left ventricular internal cavity size was normal in size. Right Ventricle: The right ventricular size is moderately enlarged. No increase in right ventricular wall thickness. Right ventricular systolic function is moderately reduced. Left Atrium: Left atrial size was moderately dilated. No left atrial/left atrial appendage thrombus was detected. Right Atrium: Right atrial size was severely dilated. Pericardium: There is no evidence of pericardial effusion. Mitral  Valve: No vegetation. The mitral valve is normal in structure. Trivial mitral valve regurgitation. Tricuspid Valve: No vegetation. The tricuspid valve is normal in structure. Tricuspid valve regurgitation is severe. Aortic Valve: There is a small filamentous structure on the aortic side of the valve that is likely a Lambl's excresence. No vegetation. The aortic valve is tricuspid. There is mild calcification of the aortic valve. Aortic valve regurgitation is not visualized. Pulmonic Valve: No vegetation. The pulmonic valve was normal in structure. Pulmonic valve regurgitation is trivial. Aorta: The aortic root is normal in size and structure. There is moderate (Grade III) plaque involving the descending aorta. IAS/Shunts: No atrial level shunt detected by color flow Doppler. DaGlori BickersD Electronically signed by DaGlori BickersD Signature Date/Time: 02/04/2022/6:15:13 PM    Final    DG ERCP  Result Date: 02/03/2022 CLINICAL DATA:  ERCP for choledocholithiasis. EXAM: ERCP TECHNIQUE: Multiple spot images obtained with the fluoroscopic device and submitted for interpretation post-procedure. FLUOROSCOPY TIME: FLUOROSCOPY TIME 4 minutes, 15 seconds (27 mGy) COMPARISON:  MRCP-02/01/2022 FINDINGS: Four spot intraoperative fluoroscopic images the right upper abdominal quadrant during ERCP are provided for review Initial image demonstrates an ERCP probe overlying the right upper abdominal quadrant. Subsequent images demonstrate selective cannulation and opacification of the common bile duct which appears moderately dilated.  There is faint opacification of the intrahepatic biliary tree which appears nondilated. There is no definitive opacification of the pancreatic duct. There are several filling defects within the gallbladder compatible with known cholelithiasis. IMPRESSION: 1. ERCP as above. 2. Cholelithiasis as demonstrated on preceding MRCP. These images were submitted for radiologic interpretation only.  Please see the procedural report for the amount of contrast and the fluoroscopy time utilized. Electronically Signed   By: Sandi Mariscal M.D.   On: 02/03/2022 15:21   US RENAL  Result Date: 02/02/2022 CLINICAL DATA:  71 year old female with acute renal failure/acute kidney injury. EXAM: RENAL / URINARY TRACT ULTRASOUND COMPLETE COMPARISON:  02/01/2022 MRCP and 01/14/2022 ultrasound. FINDINGS: Right Kidney: Renal measurements: 12.7 x 5.3 x 5 cm = volume: 176 mL. Mild cortical thinning noted. Echogenicity within normal limits. No mass or hydronephrosis visualized. Left Kidney: Renal measurements: 12.8 x 6.4 x 5.3 cm = volume: 227 mL. Mild cortical thinning noted. Echogenicity within normal limits. No solid mass or hydronephrosis visualized. A 2.2 x 1.5 x 2.8 cm cyst is again noted. Bladder: Appears normal for degree of bladder distention. Other: None. IMPRESSION: 1. Mild bilateral renal cortical thinning without other significant renal abnormality. No evidence of hydronephrosis. Electronically Signed   By: Margarette Canada M.D.   On: 02/02/2022 15:36   ECHOCARDIOGRAM COMPLETE  Result Date: 02/02/2022    ECHOCARDIOGRAM REPORT   Patient Name:   MICHAELEEN DOWN Date of Exam: 02/02/2022 Medical Rec #:  [id number]    Height:       66.0 in Accession #:    [id number]   Weight:       236.4 lb Date of Birth:  16-Dec-1950    BSA:          2.147 m Patient Age:    2 years     BP:           128/61 mmHg Patient Gender: F            HR:           77 bpm. Exam Location:  Inpatient Procedure: 2D Echo, Color Doppler and Cardiac Doppler Indications:    Bacteremia  History:        Patient has prior history of Echocardiogram examinations, most                 recent 12/10/2021. CHF, CAD, Stroke, Arrythmias:RBBB,                 Signs/Symptoms:Syncope; Risk Factors:Dyslipidemia, Hypertension                 and Diabetes.  Sonographer:    Memory Argue Referring Phys: [place]  1. Left ventricular ejection fraction,  by estimation, is 30%. The left ventricle has moderately decreased function. The left ventricle demonstrates regional wall motion abnormalities (see scoring diagram/findings for description). The left ventricular internal cavity size was moderately dilated. Left ventricular diastolic parameters are indeterminate. There is the interventricular septum is flattened in diastole ('D' shaped left ventricle), consistent with right ventricular volume overload.  2. Right ventricular systolic function is low normal. The right ventricular size is normal. There is mildly elevated pulmonary artery systolic pressure. The estimated right ventricular systolic pressure is 16.0 mmHg.  3. The mitral valve is abnormal. Trivial mitral valve regurgitation. Moderate mitral annular calcification.  4. Tricuspid valve regurgitation is severe.  5. The aortic valve is tricuspid. There is moderate calcification of the aortic valve. There is mild thickening  of the aortic valve. Aortic valve regurgitation is not visualized. Comparison(s): Prior images reviewed side by side. Compared to prior; aortic valve is more thickened and calcified (2022 full study), LV has further dilatied, TR is severe. FINDINGS  Left Ventricle: Left ventricular ejection fraction, by estimation, is 30%. The left ventricle has moderately decreased function. The left ventricle demonstrates regional wall motion abnormalities. The left ventricular internal cavity size was moderately  dilated. There is no left ventricular hypertrophy. The interventricular septum is flattened in diastole ('D' shaped left ventricle), consistent with right ventricular volume overload. Left ventricular diastolic parameters are indeterminate.  LV Wall Scoring: The inferior wall and apical septal segment are akinetic. The anterior septum and apical inferior segment are hypokinetic. Right Ventricle: The right ventricular size is normal. No increase in right ventricular wall thickness. Right  ventricular systolic function is low normal. There is mildly elevated pulmonary artery systolic pressure. The tricuspid regurgitant velocity is 3.17 m/s, and with an assumed right atrial pressure of 3 mmHg, the estimated right ventricular systolic pressure is 02.4 mmHg. Left Atrium: Left atrial size was normal in size. Right Atrium: Right atrial size was normal in size. Pericardium: There is no evidence of pericardial effusion. Mitral Valve: The mitral valve is abnormal. Moderate mitral annular calcification. Trivial mitral valve regurgitation. Tricuspid Valve: The tricuspid valve is normal in structure. Tricuspid valve regurgitation is severe. Aortic Valve: The aortic valve is tricuspid. There is moderate calcification of the aortic valve. There is mild thickening of the aortic valve. There is moderate aortic valve annular calcification. Aortic valve regurgitation is not visualized. Aortic valve mean gradient measures 3.0 mmHg. Aortic valve peak gradient measures 6.2 mmHg. Aortic valve area, by VTI measures 2.30 cm. Pulmonic Valve: The pulmonic valve was normal in structure. Pulmonic valve regurgitation is mild. No evidence of pulmonic stenosis. Aorta: The aortic root is normal in size and structure and the ascending aorta was not well visualized. IAS/Shunts: No atrial level shunt detected by color flow Doppler.  LEFT VENTRICLE PLAX 2D LVIDd:         6.00 cm      Diastology LVIDs:         5.10 cm      LV e' medial:  4.90 cm/s LV PW:         0.90 cm      LV e' lateral: 10.70 cm/s LV IVS:        0.90 cm LVOT diam:     2.10 cm LV SV:         59 LV SV Index:   28 LVOT Area:     3.46 cm  LV Volumes (MOD) LV vol d, MOD A2C: 166.0 ml LV vol d, MOD A4C: 181.0 ml LV vol s, MOD A2C: 116.0 ml LV vol s, MOD A4C: 125.0 ml LV SV MOD A2C:     50.0 ml LV SV MOD A4C:     181.0 ml LV SV MOD BP:      50.1 ml RIGHT VENTRICLE RV S prime:     8.16 cm/s TAPSE (M-mode): 1.6 cm LEFT ATRIUM           Index        RIGHT ATRIUM            Index LA diam:      4.70 cm 2.19 cm/m   RA Area:     19.90 cm LA Vol (A4C): 64.6 ml 30.09 ml/m  RA Volume:   55.55 ml  25.87 ml/m  AORTIC VALVE AV Area (Vmax):    1.96 cm AV Area (Vmean):   1.91 cm AV Area (VTI):     2.30 cm AV Vmax:           124.00 cm/s AV Vmean:          87.300 cm/s AV VTI:            0.257 m AV Peak Grad:      6.2 mmHg AV Mean Grad:      3.0 mmHg LVOT Vmax:         70.20 cm/s LVOT Vmean:        48.200 cm/s LVOT VTI:          0.171 m LVOT/AV VTI ratio: 0.67  AORTA Ao Root diam: 2.70 cm MR Peak grad: 25.3 mmHg   TRICUSPID VALVE MR Vmax:      251.50 cm/s TR Peak grad:   40.2 mmHg                           TR Vmax:        317.00 cm/s                            SHUNTS                           Systemic VTI:  0.17 m                           Systemic Diam: 2.10 cm Rudean Haskell MD Electronically signed by Rudean Haskell MD Signature Date/Time: 02/02/2022/9:53:25 AM    Final    MR ABDOMEN MRCP WO CONTRAST  Result Date: 02/01/2022 CLINICAL DATA:  Right upper quadrant abdominal pain, pancreatitis suspected. EXAM: MRI ABDOMEN WITHOUT CONTRAST  (INCLUDING MRCP) TECHNIQUE: Multiplanar multisequence MR imaging of the abdomen was performed. Heavily T2-weighted images of the biliary and pancreatic ducts were obtained, and three-dimensional MRCP images were rendered by post processing. COMPARISON:  Ultrasound February 01, 2022, CT August 22, 2020 and MRI May 28, 2019. FINDINGS: Despite efforts by the technologist and patient, motion artifact is present on today's exam and could not be eliminated. This reduces exam sensitivity and specificity. Lower chest: Heterogeneous signal in the lung bases commonly reflects atelectasis. Hepatobiliary: No hepatic steatosis. No suspicious hepatic lesion on this motion degraded examination. Cholelithiasis without findings of acute cholecystitis. Prominence of the biliary tree with the common duct measuring 8 mm and gentle tapering of the duct to the  level of the ampulla. There is a 2 mm stone sitting in the dependent portion of the distal common duct on image 24/4. Pancreas: Mild fluid signal stranding at the pancreaticoduodenal groove. No pancreatic ductal dilation. Mild reduction in the normal hyperintense intrinsic T1 signal in the pancreatic head. Spleen: Well-circumscribed T2 hyperintense 3.9 by 1.6 cm lesion in the inferior tip of the spleen on image 14/11 is new from prior MRI and incompletely characterized on this examination with primary differential considerations a subcapsular splenic pseudocyst/fluid collection or less likely a benign lymphangioma. Adrenals/Urinary Tract: Bilateral adrenal glands appear normal. Bilateral fluid density renal cysts are considered benign and require no independent imaging follow-up. Stomach/Bowel: Stomach is nondistended. No pathologic dilation or evidence of acute inflammation involving loops of large or small bowel in the abdomen. Colonic diverticulosis without findings of  acute diverticulitis. Vascular/Lymphatic: Normal caliber abdominal aorta. No pathologically enlarged abdominal lymph nodes. Other:  No significant abdominal free fluid. Musculoskeletal: No suspicious bone lesions identified. IMPRESSION: 1. Prominence of the biliary tree with the common bile duct measuring 8 mm with a 2 mm stone in the dependent portion of the distal common duct and gentle tapering of the duct to the level of the ampulla. 2. Mild fluid signal stranding at the pancreaticoduodenal groove with some loss of intrinsic T1 signal of the pancreatic head suggestive of acute interstitial pancreatitis. 3. Cholelithiasis without findings of acute cholecystitis. 4. Fluid signal lesion in the inferior tip of the spleen is nonspecific but statistically likely benign with primary differential considerations of a subcapsular splenic pseudocyst/fluid collection or less likely a benign lymphangioma. If clinically indicated consider more definitive  characterization with nonemergent abdominal MRI with and without contrast preferably as an outpatient upon resolution patient's current symptomatology when they are able to follow commands including breath hold. Electronically Signed   By: Dahlia Bailiff M.D.   On: 02/01/2022 15:53   DG Chest Port 1 View  Result Date: 02/01/2022 CLINICAL DATA:  Abdominal pain, vomiting and diarrhea EXAM: PORTABLE CHEST 1 VIEW COMPARISON:  Prior chest x-ray 01/15/2022 FINDINGS: Stable mild cardiomegaly. Atherosclerotic calcifications present in the transverse aorta. Inspiratory volumes remain low with probable bibasilar atelectasis. Mild vascular congestion but no overt edema. Patchy airspace opacity in the left lung base partially obscures the hemidiaphragm. No pneumothorax. No acute osseous abnormality. IMPRESSION: 1. Patchy airspace opacity in the left lower lobe partially obscures the hemidiaphragm. This may reflect atelectasis or infiltrate. 2. Cardiomegaly with pulmonary vascular congestion but no overt edema. 3. Low inspiratory volumes, similar compared to prior. Electronically Signed   By: Jacqulynn Cadet M.D.   On: 02/01/2022 07:09   US Abdomen Limited  Result Date: 02/01/2022 CLINICAL DATA:  Assess for gallbladder symptoms. EXAM: ULTRASOUND ABDOMEN LIMITED RIGHT UPPER QUADRANT COMPARISON:  Abdominal CT 08/12/2021 FINDINGS: Gallbladder: Shadowing calculi in the gallbladder. Full gallbladder without wall thickening or focal tenderness. Common bile duct: Diameter: 7-8 mm.  Where visualized, no filling defect. Liver: No focal lesion identified. Within normal limits in parenchymal echogenicity. Portal vein is patent on color Doppler imaging with normal direction of blood flow towards the liver. IMPRESSION: 1. Cholelithiasis. Full gallbladder without signs of acute cholecystitis. 2. Upper normal CBD diameter, please correlate with liver function tests. Electronically Signed   By: Jorje Guild M.D.   On: 02/01/2022  06:37   VAS [name] UPPER EXTREMITY VENOUS DUPLEX  Result Date: 01/30/2022 UPPER VENOUS STUDY  Patient Name:  CHERILYN SAUTTER  Date of Exam:   01/30/2022 Medical Rec #: [id number]     Accession #:    [id number] Date of Birth: [birth year]     Patient Gender: F Patient Age:   34 years Exam Location:  [facility] Procedure:      VAS [name] UPPER EXTREMITY VENOUS DUPLEX Referring Phys: [name] MILFORD --------------------------------------------------------------------------------  Indications: Swelling, and Pain Comparison Study: no prior Performing Technologist: Archie Patten RVS  Examination Guidelines: A complete evaluation includes B-mode imaging, spectral Doppler, color Doppler, and power Doppler as needed of all accessible portions of each vessel. Bilateral testing is considered an integral part of a complete examination. Limited examinations for reoccurring indications may be performed as noted.  Right Findings: +----------+------------+---------+-----------+----------+-------+ RIGHT     CompressiblePhasicitySpontaneousPropertiesSummary +----------+------------+---------+-----------+----------+-------+ Subclavian               Yes  Yes                      +----------+------------+---------+-----------+----------+-------+  Left Findings: +----------+------------+---------+-----------+----------+-------+ LEFT      CompressiblePhasicitySpontaneousPropertiesSummary +----------+------------+---------+-----------+----------+-------+ IJV           Full       Yes       Yes                      +----------+------------+---------+-----------+----------+-------+ Subclavian    Full       Yes       Yes                      +----------+------------+---------+-----------+----------+-------+ Axillary      Full       Yes       Yes                      +----------+------------+---------+-----------+----------+-------+ Brachial      Full       Yes       Yes                       +----------+------------+---------+-----------+----------+-------+ Radial        Full                                          +----------+------------+---------+-----------+----------+-------+ Ulnar         Full                                          +----------+------------+---------+-----------+----------+-------+ Cephalic      Full                                          +----------+------------+---------+-----------+----------+-------+ Basilic       Full                                          +----------+------------+---------+-----------+----------+-------+  Summary:  Right: No evidence of thrombosis in the subclavian.  Left: No evidence of deep vein thrombosis in the upper extremity. No evidence of superficial vein thrombosis in the upper extremity. Area of mixed echoes in the muscle fascia noted in the proximal upper arm. Etiology unknown. Further testing may be warranted.  *See table(s) above for measurements and observations.  Diagnosing physician: Jamelle Haring Electronically signed by Jamelle Haring on 01/30/2022 at 4:22:23 PM.    Final    ECHOCARDIOGRAM LIMITED  Result Date: 01/17/2022    ECHOCARDIOGRAM LIMITED REPORT   Patient Name:   SWANNIE MILIUS Date of Exam: 01/17/2022 Medical Rec #:  [id number]    Height:       66.0 in Accession #:    [id number]   Weight:       244.7 lb Date of Birth:  1950/05/30    BSA:          2.179 m Patient Age:    83 years     BP:           121/77 mmHg Patient  Gender: F            HR:           114 bpm. Exam Location:  Inpatient Procedure: Limited Echo Indications:    congestive heart failure  History:        Patient has prior history of Echocardiogram examinations, most                 recent 12/10/2021. Arrythmias:RBBB; Risk Factors:Diabetes,                 Hypertension and Dyslipidemia.  Sonographer:    Johny Chess RDCS Referring Phys: [id number] Kipp Brood  Sonographer Comments: Patient is obese. IMPRESSIONS  1. Left ventricular  ejection fraction, by estimation, is 30 to 35%. The left ventricle has moderately decreased function. The left ventricle demonstrates regional wall motion abnormalities (see scoring diagram/findings for description). Left ventricular  diastolic function could not be evaluated.  2. Right ventricular systolic function is moderately reduced. The right ventricular size is moderately enlarged. There is mildly elevated pulmonary artery systolic pressure.  3. The mitral valve is abnormal. Trivial mitral valve regurgitation. No evidence of mitral stenosis.  4. Tricuspid valve regurgitation is mild to moderate.  5. The aortic valve is normal in structure. Aortic valve regurgitation is not visualized. No aortic stenosis is present.  6. The inferior vena cava is normal in size with greater than 50% respiratory variability, suggesting right atrial pressure of 3 mmHg. FINDINGS  Left Ventricle: Left ventricular ejection fraction, by estimation, is 30 to 35%. The left ventricle has moderately decreased function. The left ventricle demonstrates regional wall motion abnormalities. The left ventricular internal cavity size was normal in size. There is no left ventricular hypertrophy. Left ventricular diastolic function could not be evaluated. Left ventricular diastolic function could not be evaluated due to nondiagnostic images.  LV Wall Scoring: The entire inferior wall and apex are akinetic. The entire anterior wall, entire lateral wall, and entire septum are hypokinetic. Right Ventricle: The right ventricular size is moderately enlarged. No increase in right ventricular wall thickness. Right ventricular systolic function is moderately reduced. There is mildly elevated pulmonary artery systolic pressure. The tricuspid regurgitant velocity is 3.16 m/s, and with an assumed right atrial pressure of 3 mmHg, the estimated right ventricular systolic pressure is 86.7 mmHg. Left Atrium: Left atrial size was normal in size. Right Atrium:  Right atrial size was normal in size. Pericardium: There is no evidence of pericardial effusion. Mitral Valve: The mitral valve is abnormal. Trivial mitral valve regurgitation. No evidence of mitral valve stenosis. Tricuspid Valve: The tricuspid valve is normal in structure. Tricuspid valve regurgitation is mild to moderate. No evidence of tricuspid stenosis. Aortic Valve: The aortic valve is normal in structure. Aortic valve regurgitation is not visualized. No aortic stenosis is present. Pulmonic Valve: The pulmonic valve was normal in structure. Pulmonic valve regurgitation is not visualized. No evidence of pulmonic stenosis. Aorta: The aortic root is normal in size and structure. Venous: The inferior vena cava is normal in size with greater than 50% respiratory variability, suggesting right atrial pressure of 3 mmHg. IAS/Shunts: No atrial level shunt detected by color flow Doppler. LEFT VENTRICLE PLAX 2D LVIDd:         5.40 cm LVIDs:         4.50 cm LV PW:         1.00 cm LV IVS:        0.80 cm  IVC IVC diam: 1.70 cm LEFT  ATRIUM         Index LA diam:    4.40 cm 2.02 cm/m   AORTA Ao Asc diam: 2.80 cm TRICUSPID VALVE TR Peak grad:   39.9 mmHg TR Vmax:        316.00 cm/s Skeet Latch MD Electronically signed by Skeet Latch MD Signature Date/Time: 01/17/2022/12:38:00 PM    Final    EEG adult  Result Date: 01/16/2022 Lora Havens, MD     01/16/2022 10:41 AM Patient Name: Sanyah Molnar MRN: [id number] Epilepsy Attending: Lora Havens Referring Physician/Provider: Christiana Fuchs, DO Date: 01/16/2022 Duration: 25.33 mins Patient history: 71yo F with ams. EEG to evaluate for seizure Level of alertness: Awake AEDs during EEG study: None Technical aspects: This EEG study was done with scalp electrodes positioned according to the 10-20 International system of electrode placement. Electrical activity was reviewed with band pass filter of 1-_0 , sensitivity of 7 uV/mm, display speed of 67m/sec with a _1   notched filter applied as appropriate. EEG data were recorded continuously and digitally stored.  Video monitoring was available and reviewed as appropriate. Description: No posterior dominant rhythm was seen. EEG showed continuous generalized 3 to 5 Hz theta-delta slowing. Generalized periodic discharges with triphasic morphology at 1.5-2 Hz were also noted. Hyperventilation and photic stimulation were not performed.   ABNORMALITY - Periodic discharges with triphasic morphology, generalized ( GPDs) - Continuous slow, generalized IMPRESSION: This study showed generalized periodic discharges with triphasic morphology which can be on the ictal-interictal continuum.  However the frequency and morphology is more commonly suggestive of toxic-metabolic causes.  Additionally there is moderate to severe diffuse encephalopathy, nonspecific etiology.  If concern for ictal -interictal activity persists, long-term EEG can be considered.  Dr ALynetta Marewas notified. Priyanka OBarbra Sarks  CT HEAD WO CONTRAST (5MM)  Result Date: 01/15/2022 CLINICAL DATA:  Altered mental status, nontraumatic (Ped 0-17y) EXAM: CT HEAD WITHOUT CONTRAST TECHNIQUE: Contiguous axial images were obtained from the base of the skull through the vertex without intravenous contrast. RADIATION DOSE REDUCTION: This exam was performed according to the departmental dose-optimization program which includes automated exposure control, adjustment of the mA and/or kV according to patient size and/or use of iterative reconstruction technique. COMPARISON:  None Available. FINDINGS: Brain: No evidence of acute infarction, hemorrhage, hydrocephalus, extra-axial collection or mass lesion/mass effect. Chronic microvascular ischemic disease and cerebral atrophy. Vascular: No hyperdense vessel identified. Skull: No acute fracture. Sinuses/Orbits: Right maxillary sinus air-fluid level. Otherwise, clear sinuses. No acute orbital findings per Other: No mastoid effusions.  IMPRESSION: No evidence of acute intracranial abnormality. Electronically Signed   By: FMargaretha SheffieldM.D.   On: 01/15/2022 10:07   DG Chest Port 1 View  Result Date: 01/15/2022 CLINICAL DATA:  71year old female with respiratory failure. EXAM: PORTABLE CHEST 1 VIEW COMPARISON:  Portable chest 01/14/2022 and earlier. FINDINGS: Portable AP semi upright view at 0537 hours. Stable somewhat low lung volumes. Stable mediastinal contours, borderline to mild cardiomegaly. Visualized tracheal air column is within normal limits. Allowing for portable technique the lungs are clear. No pneumothorax or pleural effusion. Paucity of bowel gas in the visible upper abdomen. No acute osseous abnormality identified. IMPRESSION: No acute cardiopulmonary abnormality. Electronically Signed   By: HGenevie AnnM.D.   On: 01/15/2022 06:02   [place]domen Complete  Result Date: 01/14/2022 CLINICAL DATA:  Abdomen pain EXAM: ABDOMEN ULTRASOUND COMPLETE COMPARISON:  CT 08/22/2020, ultrasound 08/14/2020 FINDINGS: Gallbladder: Distended with sludge and small stones. Increased wall thickness at 4.4 mm. Negative  sonographic Percell Miller. Common bile duct: Diameter: 3.9 mm Liver: No focal lesion identified. Within normal limits in parenchymal echogenicity. Portal vein is patent on color Doppler imaging with normal direction of blood flow towards the liver. IVC: No abnormality visualized. Pancreas: Visualized portion unremarkable. Spleen: Upper normal in size Right Kidney: Length: 12.7 cm. Echogenicity normal. No hydronephrosis. Cyst at the lower pole measuring 2.3 cm, no follow-up imaging is recommended. Left Kidney: Length: 11.8 cm. Cortex appears slightly echogenic. No mass or hydronephrosis. Abdominal aorta: No aneurysm visualized. Other findings: None. IMPRESSION: 1. Slightly distended gallbladder with sludge and stones. Gallbladder wall thickening is present but sonographic Percell Miller is negative; if clinical symptoms are suggestive of acute  gallbladder disease, correlation with nuclear medicine hepatobiliary imaging could be obtained. 2. Left renal cortex appears slightly echogenic suggesting medical renal disease. No hydronephrosis. Electronically Signed   By: Donavan Foil M.D.   On: 01/14/2022 22:21   DG Chest Port 1 View  Result Date: 01/14/2022 CLINICAL DATA:  Chest pain EXAM: PORTABLE CHEST 1 VIEW COMPARISON:  Chest x-ray dated March 05, 2021 FINDINGS: Unchanged mild cardiomegaly. Right costophrenic angle is excluded from the field of view. Mild interstitial opacities. No focal consolidation. No large pleural effusion or evidence of pneumothorax. IMPRESSION: Similar mild interstitial opacities, likely due to pulmonary edema. Electronically Signed   By: Yetta Glassman M.D.   On: 01/14/2022 18:49    Microbiology: Recent Results (from the past 240 hour(s))  Culture, blood (Routine X 2) w Reflex to ID Panel     Status: None   Collection Time: 02/03/22  9:39 AM   Specimen: BLOOD RIGHT WRIST  Result Value Ref Range Status   Specimen Description BLOOD RIGHT WRIST  Final   Special Requests   Final    BOTTLES DRAWN AEROBIC AND ANAEROBIC Blood Culture adequate volume   Culture   Final    NO GROWTH 5 DAYS Performed at [facility], [address] [place], [place] [address]    Report Status 02/08/2022 FINAL  Final  Culture, blood (Routine X 2) w Reflex to ID Panel     Status: None   Collection Time: 02/03/22  9:45 AM   Specimen: BLOOD RIGHT FOREARM  Result Value Ref Range Status   Specimen Description BLOOD RIGHT FOREARM  Final   Special Requests   Final    BOTTLES DRAWN AEROBIC AND ANAEROBIC Blood Culture adequate volume   Culture   Final    NO GROWTH 5 DAYS Performed at [facility], [place] [address], [place], [place] [address]    Report Status 02/08/2022 FINAL  Final     Labs: Basic Metabolic Panel: Recent Labs  Lab 02/06/22 0543 02/07/22 0813 02/08/22 0438 02/09/22 0355 02/10/22 1016 02/11/22 0640   NA 133* 137 135 135 137 139  K 4.5 4.6 4.5 4.2 4.7 4.3  CL 97* 100 99 101 100 101  CO2 _0 GLUCOSE 197* 110* 156* 172* 284* 112*  BUN 82* 77* 75* 71* 69* 68*  CREATININE 3.29* 3.12* 3.06* 2.94* 3.19* 2.92*  CALCIUM 8.3* 8.6* 8.6* 8.7* 9.4 9.7  MG 1.9 2.2  --   --  1.9 1.8  PHOS  --   --   --   --  3.8 3.2   Liver Function Tests: Recent Labs  Lab 02/10/22 1016 02/11/22 0640  ALBUMIN 2.6* 2.8*   No results for input(s): "LIPASE", "AMYLASE" in the last 168 hours. No results for input(s): "  AMMONIA" in the last 168 hours. CBC: Recent Labs  Lab 02/07/22 0813 02/08/22 0438 02/09/22 0355 02/10/22 1016 02/11/22 0640  WBC 6.7 4.9 5.6 5.9 12.0*  NEUTROABS  --   --   --  4.3  --   HGB 9.4* 8.9* 8.9* 10.0* 10.5*  HCT 30.2* 28.2* 28.8* 31.8* 32.6*  MCV 85.8 86.8 86.2 85.7 84.5  PLT 143* 140* 152 180 207   Cardiac Enzymes: Recent Labs  Lab 02/06/22 0543  CKTOTAL 20*   BNP: BNP (last 3 results) Recent Labs    01/15/22 0132 01/29/22 1147 02/01/22 0615  BNP 1,929.8* 891.1* 451.5*    ProBNP (last 3 results) No results for input(s): "PROBNP" in the last 8760 hours.  CBG: Recent Labs  Lab 02/10/22 1120 02/10/22 1700 02/10/22 2112 02/11/22 0635 02/11/22 1105  GLUCAP 281* 307* 338* 123* 207*       Signed:  Irine Seal MD.  [facility] 02/11/2022, 4:09 PM

## 2022-02-11 NOTE — Progress Notes (Signed)
Claimed that the last  time she took metolazone   was 3 to 4 weeks ago.

## 2022-02-12 ENCOUNTER — Telehealth: Payer: Self-pay | Admitting: *Deleted

## 2022-02-12 LAB — GLUCOSE, CAPILLARY: Glucose-Capillary: 198 mg/dL — ABNORMAL HIGH (ref 70–99)

## 2022-02-12 NOTE — Patient Outreach (Signed)
  Care Coordination [facility] Note Transition Care Management Unsuccessful Follow-up Telephone Call  Date of discharge and from where:  02/11/22 from [facility]  Attempts:  1st Attempt  Reason for unsuccessful TCM follow-up call:  No answer/busy  Chong Sicilian, BSN, RN-BC RN Care Coordinator [place]: [phone] Main #: [phone]

## 2022-02-13 ENCOUNTER — Telehealth: Payer: Self-pay | Admitting: *Deleted

## 2022-02-13 DIAGNOSIS — I130 Hypertensive heart and chronic kidney disease with heart failure and stage 1 through stage 4 chronic kidney disease, or unspecified chronic kidney disease: Secondary | ICD-10-CM | POA: Diagnosis not present

## 2022-02-13 DIAGNOSIS — Z23 Encounter for immunization: Secondary | ICD-10-CM | POA: Diagnosis not present

## 2022-02-13 DIAGNOSIS — E1165 Type 2 diabetes mellitus with hyperglycemia: Secondary | ICD-10-CM | POA: Diagnosis not present

## 2022-02-13 DIAGNOSIS — N184 Chronic kidney disease, stage 4 (severe): Secondary | ICD-10-CM | POA: Diagnosis not present

## 2022-02-13 DIAGNOSIS — E1122 Type 2 diabetes mellitus with diabetic chronic kidney disease: Secondary | ICD-10-CM | POA: Diagnosis not present

## 2022-02-13 DIAGNOSIS — N189 Chronic kidney disease, unspecified: Secondary | ICD-10-CM | POA: Diagnosis not present

## 2022-02-13 DIAGNOSIS — Z9641 Presence of insulin pump (external) (internal): Secondary | ICD-10-CM | POA: Diagnosis not present

## 2022-02-13 DIAGNOSIS — D631 Anemia in chronic kidney disease: Secondary | ICD-10-CM | POA: Diagnosis not present

## 2022-02-13 NOTE — Patient Outreach (Signed)
  Care Coordination [facility] Note Transition Care Management Unsuccessful Follow-up Telephone Call  Date of discharge and from where:  02/11/22 from [facility]  Attempts:  2nd Attempt  Reason for unsuccessful TCM follow-up call:  Voice mail full  Chong Sicilian, BSN, RN-BC RN Care Coordinator [place]: [phone] Main #: [phone]

## 2022-02-14 ENCOUNTER — Telehealth: Payer: Self-pay | Admitting: *Deleted

## 2022-02-14 NOTE — Patient Outreach (Signed)
  Care Coordination TOC Note Transition Care Management Unsuccessful Follow-up Telephone Call  Date of discharge and from where:  02/11/22 from [facility]   Attempts:  3rd Attempt  Reason for unsuccessful TCM follow-up call:  No answer/busy  Patient has f/u appt with PCP on 02/18/22 at 12:00, cardio on 10/3 at 10:30, and Inf Dis on 9/25.  Chong Sicilian, BSN, RN-BC RN Care Coordinator [place] Direct [facility]ial: [phone] Main #: [phone]

## 2022-02-17 ENCOUNTER — Other Ambulatory Visit: Payer: Self-pay

## 2022-02-17 ENCOUNTER — Ambulatory Visit (INDEPENDENT_AMBULATORY_CARE_PROVIDER_SITE_OTHER): Payer: HMO | Admitting: Internal Medicine

## 2022-02-17 VITALS — BP 129/73 | HR 71 | Temp 98.0°F

## 2022-02-17 DIAGNOSIS — R7881 Bacteremia: Secondary | ICD-10-CM

## 2022-02-17 NOTE — Progress Notes (Unsigned)
Patient Active Problem List   Diagnosis Date Noted   Sepsis without acute organ dysfunction ([place])    Bacteremia    Acute pancreatitis 02/01/2022   Abdominal pain 02/01/2022   SIRS (systemic inflammatory response syndrome) ([place]) 02/01/2022   Transaminitis 02/01/2022   History of anemia due to chronic kidney disease 02/01/2022   Paroxysmal atrial fibrillation ([place]) 02/01/2022   DKA (diabetic ketoacidosis) ([place]) 01/14/2022   NSTEMI (non-ST elevated myocardial infarction) ([place]) 03/05/2021   Acute renal failure superimposed on stage 4 chronic kidney disease ([place]) 08/22/2020   Hypoalbuminemia 05/25/2020   GERD (gastroesophageal reflux disease) 05/25/2020   Pressure injury of skin 05/17/2020   Type 2 diabetes mellitus with diabetic polyneuropathy, with long-term current use of insulin ([place]) 03/07/2020   Obesity, Class III, BMI 40-49.9 (morbid obesity) ([place]) 03/07/2020   Common bile duct (CBD) obstruction 05/28/2019   Benign neoplasm of ascending colon    Benign neoplasm of transverse colon    Benign neoplasm of descending colon    Benign neoplasm of sigmoid colon    Gastric polyps    Hyperkalemia 03/11/2019   Prolonged QT interval 03/11/2019   Onychomycosis 06/21/2018   Osteomyelitis of second toe of right foot ([place])    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 (HCC)    PVD (peripheral vascular disease) ([place]) 10/26/2017   E-coli UTI 07/27/2017   Hypothyroidism 07/27/2017   PAH (pulmonary artery hypertension) (HCC)    Impaired ambulation 07/19/2017   Nausea & vomiting 07/15/2017   Leg cramps 02/27/2017   Peripheral edema 01/12/2017   Diabetic neuropathy ([place]) 11/12/2016   CKD (chronic kidney disease), stage IV ([place]) 10/24/2015   Anemia 10/03/2015   Generalized anxiety disorder 10/03/2015   Insomnia 10/03/2015   Hyperglycemia due to diabetes mellitus ([place]) 06/07/2015   Chronic diastolic CHF (congestive heart failure) ([place]) 06/07/2015   Non  compliance with medical treatment 04/17/2014   Rotator cuff tear 03/14/2014   Class 3 obesity ([place]) 09/23/2013   Chronic HFrEF (heart failure with reduced ejection fraction) ([place]) 06/03/2013   Hypotension 12/25/2012   Urinary incontinence    MDD (major depressive disorder) 11/12/2010   RBBB (right bundle branch block)    Coronary artery disease    Hyperlipemia 01/22/2009   Essential hypertension 01/22/2009    Patient's Medications  New Prescriptions   No medications on file  Previous Medications   ALBUTEROL (VENTOLIN HFA) 108 (90 BASE) MCG/ACT INHALER    Inhale 1-2 puffs into the lungs every 6 (six) hours as needed for wheezing or shortness of breath.   AMIODARONE (PACERONE) 200 MG TABLET    Take 1 tablet (200 mg total) by mouth daily.   APIXABAN (ELIQUIS) 5 MG TABS TABLET    Take 1 tablet (5 mg total) by mouth 2 (two) times daily.   ASPIRIN EC 81 MG TABLET    Take 1 tablet (81 mg total) by mouth daily with breakfast.   ATORVASTATIN (LIPITOR) 80 MG TABLET    TAKE 1 TABLET BY MOUTH EVERY DAY   BLOOD GLUCOSE METER KIT AND SUPPLIES    Use up to four times daily as directed.   CARVEDILOL (COREG) 6.25 MG TABLET    Take 1 tablet (6.25 mg total) by mouth 2 (two) times daily with a meal.   CLOTRIMAZOLE (LOTRIMIN) 1 % CREAM    Apply topically 2 (two) times daily for 7 days.   COLCHICINE 0.6 MG TABLET    Take 0.6  mg by mouth daily as needed (gout).   DIPHENOXYLATE-ATROPINE (LOMOTIL) 2.5-0.025 MG TABLET    Take 1 tablet by mouth 4 (four) times daily as needed for diarrhea or loose stools.   FERROUS SULFATE 325 (65 FE) MG TABLET    Take 325 mg by mouth 2 (two) times daily.   FINGERSTIX LANCETS MISC    Use as directed to check blood sugars as need up to 4 times daily   GLUCOSE BLOOD TEST STRIP    use as directed to check blood sugars up to 4 times daily   INSULIN ASPART (NOVOLOG) 100 UNIT/ML FLEXPEN    0-9 Units, Subcutaneous, 3 times daily with meals. CBG 70-120: 0 units CBG 121-150: 1 unit CBG  151-200: 2 units CBG 201-250: 3 units CBG 251-300: 5 units CBG 301-350: 7 units CBG 351-400: 9 units CBG>400: call MD   INSULIN DETEMIR (LEVEMIR) 100 UNIT/ML FLEXPEN    Inject 15 Units into the skin 2 (two) times daily.   INSULIN PEN NEEDLE 32G X 4 MM MISC    Use as directed   ISOSORBIDE MONONITRATE (IMDUR) 30 MG 24 HR TABLET    Take 0.5 tablets (15 mg total) by mouth daily.   LEVOTHYROXINE (SYNTHROID, LEVOTHROID) 50 MCG TABLET    Take 1 tablet (50 mcg total) by mouth daily before breakfast.   LINEZOLID (ZYVOX) 600 MG TABLET    Take 1 tablet (600 mg total) by mouth 2 (two) times daily for 10 days.   MAGNESIUM OXIDE (MAG-OX) 400 (240 MG) MG TABLET    Take 1 tablet (400 mg total) by mouth daily.   METOLAZONE (ZAROXOLYN) 2.5 MG TABLET    Take 1 tablet (2.5 mg total) by mouth as directed. Every other week   MICONAZOLE (MICONAZOLE 7) 2 % VAGINAL CREAM    Place 1 Applicatorful vaginally at bedtime for 7 days.   NITROGLYCERIN (NITROSTAT) 0.4 MG SL TABLET    PLACE 1 TABLET (0.4 MG TOTAL) UNDER THE TONGUE EVERY 5 (FIVE) MINUTES AS NEEDED FOR CHEST PAIN.   NUTRITIONAL SUPPLEMENTS (FRUIT & VEGETABLE DAILY PO)    Take 1 tablet by mouth daily.   NYSTATIN (MYCOSTATIN/NYSTOP) POWDER    Apply 1 Application topically 2 (two) times daily as needed for irritation.   NYSTATIN CREAM (MYCOSTATIN)    Apply 1 Application topically 2 (two) times daily.   OXYGEN    Inhale 2 L/min into the lungs at bedtime.   POLYETHYLENE GLYCOL (MIRALAX / GLYCOLAX) 17 G PACKET    Take 17 g by mouth as needed for mild constipation.   PREGABALIN (LYRICA) 150 MG CAPSULE    Take 150 mg by mouth 2 (two) times daily.   SENNA-DOCUSATE (SENOKOT-S) 8.6-50 MG TABLET    Take 1 tablet by mouth 2 (two) times daily.   TORSEMIDE (DEMADEX) 20 MG TABLET    Take 4 tablets (80 mg total) by mouth 2 (two) times daily.   TRINTELLIX 10 MG TABS TABLET    TAKE 1 TABLET BY MOUTH EVERY DAY   URSODIOL (ACTIGALL) 500 MG TABLET    Take 500 mg by mouth 3 (three) times  daily.  Modified Medications   No medications on file  Discontinued Medications   No medications on file    Subjective: 71 year old female with past medical history as below presents for hospital follow-up for E. Faecalis and E. Faecium Bacteremia Suspected Secondary to GI source.  TTE showed thickened/calcified aortic valve.  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 - Abdominal ultrasound showed gallstones with prominent CBD no CBD stones visualized. -GI was consulted.  MRCP showed choledocholithiasis with mild pancreatitis(2 mm stone in the dependent portion of distal common bile duct and gentle tapering of the duct at level of the ampulla).  ERCP on 9/11 showed choledocholithiasis.  Patient underwent biliary sphincterectomy and balloon extraction. Still has some biliary colic. General surgery following no plans for lap chole a this point as pt not though to have an acute infection. Per cards pt has  surgical risk for cardiac complication and CKD progressing to ESRD may require HD.  She was initially on vancomycin which was transitioned to daptomycin to avoid further nephrotoxicity.  On discharge she was sent home with linezolid x2 weeks from negative cultures.  EOT 9/25.  Opted for p.o. therapy as patient was apprehensive that she cannot use a PICC line. Today 02/17/2022: Patient presents for follow-up.  She reports she has been having diarrhea lately with 5-6 loose stools per day.  She denies fevers or chills.  She reports 100% adherence to her antibiotics, she reports she is still on them.  Review of Systems: Review of Systems  All other systems reviewed and are negative.   Past Medical History:  Diagnosis Date   Acute MI ([place]) 1999; 2007   Anemia    hx   Anginal pain ([place])    Anxiety    ARF (acute renal failure) ([place]) 06/2017   [facility] Kidney Asso   Arthritis    "generalized" (03/15/2014)   CAD  (coronary artery disease)    MI in 2000 - MI  2007 - treated bare metal stent (no nuclear since then as 9/11)   Carotid artery disease (HCC)    CHF (congestive heart failure) ([place])    Chronic diastolic heart failure ([place])    a) ECHO (08/2013) EF 55-60% and RV function nl b) RHC (08/2013) RA 4, RV 30/5/7, PA 25/10 (16), PCWP 7, Fick CO/CI 6.3/2.7, PVR 1.5 WU, PA 61 and 66%   Daily headache    "~ every other day; since I fell in June" (03/15/2014)   Depression    Dyslipidemia    Dyspnea    Exertional shortness of breath    HTN (hypertension)    Hypothyroidism    Neuropathy    Obesity    Osteoarthritis    Peripheral neuropathy    PONV (postoperative nausea and vomiting)    RBBB (right bundle branch block)    Old   Stroke ([place])    mini strokes   Syncope    likely due to low blood sugar   Tachycardia    Sinus tachycardia   Type II diabetes mellitus (HCC)    Type II   Urinary incontinence    Venous insufficiency     Social History   Tobacco Use   Smoking status: Former    Packs/day: 3.00    Years: 32.00    Total pack years: 96.00    Types: Cigarettes    Quit date: 10/24/1997    Years since quitting: 24.3   Smokeless tobacco: Never  Vaping Use   Vaping Use: Never used  Substance Use Topics   Alcohol use: Not Currently    Comment: "might have 2-3 daiquiris in the summer"   Drug use: No    Family History  Problem Relation Age of Onset   Heart attack Mother 68    Allergies  Allergen Reactions   Keflex [Cephalexin]  Diarrhea   Codeine Nausea And Vomiting and Other (See Comments)    Health Maintenance  Topic Date Due   FOOT EXAM  Never done   COVID-19 Vaccine (4 - Moderna risk series) 08/13/2020   INFLUENZA VACCINE  12/24/2021   MAMMOGRAM  08/17/2022 (Originally 11/21/2017)   DEXA SCAN  08/17/2022 (Originally 06/06/2014)   OPHTHALMOLOGY EXAM  08/22/2022 (Originally 09/24/2018)   TETANUS/TDAP  08/22/2022 (Originally 06/06/1969)   HEMOGLOBIN A1C  07/23/2022   Diabetic  kidney evaluation - Urine ACR  12/05/2022   Diabetic kidney evaluation - GFR measurement  02/12/2023   COLONOSCOPY (Pts 45-51yr Insurance coverage will need to be confirmed)  03/12/2029   Pneumonia Vaccine 71 Years old  Completed   Hepatitis C Screening  Completed   Zoster Vaccines- Shingrix  Completed   HPV VACCINES  Aged Out    Objective:  Vitals:   02/17/22 1349  BP: 129/73  Pulse: 71  Temp: 98 F (36.7 C)  TempSrc: Oral  SpO2: 96%   There is no height or weight on file to calculate BMI.  Physical Exam Constitutional:      Appearance: Normal appearance.  HENT:     Head: Normocephalic and atraumatic.     Right Ear: Tympanic membrane normal.     Left Ear: Tympanic membrane normal.     Nose: Nose normal.     Mouth/Throat:     Mouth: Mucous membranes are moist.  Eyes:     Extraocular Movements: Extraocular movements intact.     Conjunctiva/sclera: Conjunctivae normal.     Pupils: Pupils are equal, round, and reactive to light.  Cardiovascular:     Rate and Rhythm: Normal rate and regular rhythm.     Heart sounds: No murmur heard.    No friction rub. No gallop.  Pulmonary:     Effort: Pulmonary effort is normal.     Breath sounds: Normal breath sounds.  Abdominal:     General: Abdomen is flat.     Palpations: Abdomen is soft.  Musculoskeletal:        General: Normal range of motion.  Skin:    General: Skin is warm and dry.  Neurological:     General: No focal deficit present.     Mental Status: She is alert and oriented to person, place, and time.  Psychiatric:        Mood and Affect: Mood normal.     Lab Results Lab Results  Component Value Date   WBC 12.0 (H) 02/11/2022   HGB 10.5 (L) 02/11/2022   HCT 32.6 (L) 02/11/2022   MCV 84.5 02/11/2022   PLT 207 02/11/2022    Lab Results  Component Value Date   CREATININE 2.92 (H) 02/11/2022   BUN 68 (H) 02/11/2022   NA 139 02/11/2022   K 4.3 02/11/2022   CL 101 02/11/2022   CO2 27 02/11/2022    Lab  Results  Component Value Date   ALT 31 02/04/2022   AST 23 02/04/2022   GGT 144 (H) 02/01/2022   ALKPHOS 255 (H) 02/04/2022   BILITOT 1.0 02/04/2022    Lab Results  Component Value Date   CHOL 92 02/01/2022   HDL 34 (L) 02/01/2022   LDLCALC 29 02/01/2022   TRIG 143 02/01/2022   CHOLHDL 2.7 02/01/2022   No results found for: "LABRPR", "RPRTITER" No results found for: "HIV1RNAQUANT", "HIV1RNAVL", "CD4TABS"   AP #Enterococcus faecalis and E faecium bacteremia suspect 2/2 GI source #Choledocholithiasis with mild pancreatitis #Dm-poorly controlled(A1C >15.5  on 8/28) - Patient is still taking linezolid, and initial end date was 9/24.  She reports she has not missed any of her antibiotics since discharge. -Counseled on glycemic control Plan: -Obtain surveillance blood cultures, cbc, cmp -Stop linezolid as she has completed antibiotics x2 weeks from negative cultures -Follow-up in  1 week to review labs  #Diarrhea -5-6 watery stools per day -We will obtain C. difficile and GIP   I have personally spent 47 minutes involved in face-to-face and non-face-to-face activities for this patient on the day of the visit. Professional time spent includes the following activities: Preparing to see the patient (review of tests), Obtaining and/or reviewing separately obtained history (admission/discharge record), Performing a medically appropriate examination and/or evaluation , Ordering medications/tests/procedures, referring and communicating with other health care professionals, Documenting clinical information in the EMR, Independently interpreting results (not separately reported), Communicating results to the patient/family/caregiver, Counseling and educating the patient/family/caregiver and Care coordination (not separately reported).     [name], MD [place] [facility] 02/17/2022, 1:56 PM

## 2022-02-18 ENCOUNTER — Other Ambulatory Visit: Payer: Self-pay | Admitting: Internal Medicine

## 2022-02-18 ENCOUNTER — Other Ambulatory Visit: Payer: HMO

## 2022-02-18 ENCOUNTER — Other Ambulatory Visit: Payer: Self-pay

## 2022-02-18 ENCOUNTER — Ambulatory Visit (INDEPENDENT_AMBULATORY_CARE_PROVIDER_SITE_OTHER): Payer: HMO | Admitting: Family Medicine

## 2022-02-18 VITALS — BP 126/72 | HR 78 | Temp 98.2°F | Ht 66.0 in | Wt 245.0 lb

## 2022-02-18 DIAGNOSIS — E1142 Type 2 diabetes mellitus with diabetic polyneuropathy: Secondary | ICD-10-CM | POA: Diagnosis not present

## 2022-02-18 DIAGNOSIS — R7881 Bacteremia: Secondary | ICD-10-CM

## 2022-02-18 DIAGNOSIS — N184 Chronic kidney disease, stage 4 (severe): Secondary | ICD-10-CM | POA: Diagnosis not present

## 2022-02-18 DIAGNOSIS — I480 Paroxysmal atrial fibrillation: Secondary | ICD-10-CM | POA: Diagnosis not present

## 2022-02-18 DIAGNOSIS — I5020 Unspecified systolic (congestive) heart failure: Secondary | ICD-10-CM

## 2022-02-18 DIAGNOSIS — Z794 Long term (current) use of insulin: Secondary | ICD-10-CM | POA: Diagnosis not present

## 2022-02-18 DIAGNOSIS — K851 Biliary acute pancreatitis without necrosis or infection: Secondary | ICD-10-CM

## 2022-02-18 NOTE — Progress Notes (Signed)
Subjective:   Admit date: 01/31/2022 Discharge date: 02/11/2022   Time spent: 60 minutes   Recommendations for Outpatient Follow-up:  Follow-up with Dr. Rosendo Gros, general surgery as needed for gallbladder symptoms. Follow-up with Dr. Haroldine Laws, cardiology on 02/25/2022 at 10:30 AM. Follow-up with [name], MD in 2 weeks.  On follow-up patient will need a basic metabolic profile done to follow-up on electrolytes and renal function.  Patient's diabetes will need to be reassessed on follow-up. Follow-up with Dr. Candiss Norse, ID on 02/17/2022 as scheduled.     Discharge Diagnoses:  Principal Problem:   Acute pancreatitis Active Problems:   Hyperlipemia   Chronic HFrEF (heart failure with reduced ejection fraction) (HCC)   CKD (chronic kidney disease), stage IV (HCC)   Nausea & vomiting   Hypothyroidism   Type 2 diabetes mellitus with diabetic polyneuropathy, with long-term current use of insulin (HCC)   Abdominal pain   SIRS (systemic inflammatory response syndrome) (HCC)   Transaminitis   History of anemia due to chronic kidney disease   Paroxysmal atrial fibrillation ([place])   Bacteremia   Sepsis without acute organ dysfunction [facility])     Discharge Condition: Stable and improved   Diet recommendation: Heart healthy diet        Filed Weights    02/09/22 0500 02/10/22 0637 02/11/22 0500  Weight: 112.3 kg 108.7 kg 107 kg      History of present illness:  HPI per Dr. Velia Meyer : Susan [name] is a 71 y.o. female with medical history significant for chronic biventricular systolic heart failure with LVEF 30 to 35%, stage IV CKD with baseline creatinine range 3.0-3.8, anemia of chronic kidney disease with baseline hemoglobin 10-12, poorly controlled type 2 diabetes mellitus, hypertension, hyperlipidemia, paroxysmal atrial fibrillation chronically anticoagulated on Eliquis, chronic hypoxic respiratory failure on 2 L nasal cannula nightly, who is admitted to [facility] on  01/31/2022 with suspected acute pancreatitis after presenting from home to [facility] ED complaining of abdominal pain.    Of note, the patient was recently hospitalized at [facility] from 01/14/2022 to 01/23/22 for DKA with hospital course complicated by acute on chronic biventricular systolic heart failure, atrial fibrillation with RVR.  She was subsequently discharged to home with home health from the hospitalist service on 01/23/2022.    She presents to [facility] emergency department this evening complaining of 2 to 3 days of new onset epigastric discomfort, which she describes as sharp, with radiation into the bilateral upper abdominal quadrants.  Has progressed in severity since onset, is now nearly constant, with postprandial exacerbation as well as exacerbation with palpation over the abdomen.  No preceding trauma.  This abdominal pain is associated with recurrent nausea resulting in at least 3-4 daily episodes of nonbloody, nonbilious emesis over that timeframe, with most recent such episode occurring just prior to presenting to [facility] emergency department this evening.  She notes consequential decline in oral intake of food and water over that time.  She also notes new onset loose stool over the last 2 to 3 days, reporting 1-2 such episodes per day over that timeframe, in the absence of any melena or hematochezia.  No recent travel.  Denies any associated subjective fever, chills, rigors, or generalized myalgias.   Not associate with any new onset rash, neck stiffness, shortness of breath, cough, dysuria or gross hematuria.  She also denies any recent chest pain, palpitations, diaphoresis.    Denies any routine or recent alcohol consumption, nor any use of recreational drugs.  Outpatient medications notable for Januvia in the setting of poorly controlled type 2 diabetes mellitus.  Does not take Depakote nor any recent systemic corticosteroids.   Medical history notable for chronic biventricular systolic heart  failure, with most recent echocardiogram on 01/17/2022 showing LVEF 30 to 35%, indeterminate diastolic parameters, moderately reduced right ventricular systolic function, mild to moderate tricuspid regurgitation, and trivial mitral regurgitation.   She also has a history of paroxysmal atrial fibrillation for which she is anticoagulated on Eliquis.  Outpatient medications include Coreg as well as amiodarone.  Per chart review, she also has a history of stage IV CKD with baseline creatinine range 3.0-3.8.  History of chronic hypoxic respiratory failure is noted, with the patient on 2 L nasal cannula that appears limited to nocturnal timeframe.   Per chart review, most recent liver enzymes, which were drawn on 01/14/2022, were notable for the following: Alkaline phosphatase 103, AST 28, ALT 24, total bilirubin 1.9.  Additionally, most recent prior lipase, also drawn on 01/14/2022 was noted to be 38.         ED Course:  Vital signs in the ED were notable for the following: Afebrile; heart rate 87-98; blood pressure 159/67; respiratory rate 15-20; oxygen saturation 94 to 100% on her baseline 2 L nasal cannula nocturnal.    Labs were notable for the following: CMP notable for the following: Sodium 135,   At 25, creatinine 3.69, glucose 287, calcium, adjusted for mild hypoalbuminemia noted to be approximately 10.0, albumin 2.8, alkaline phosphatase 404, AST 242, ALT 79, total bilirubin 3.2.  Lipase 1341.  CBC notable for white blood cell count 20,100 relative to most recent prior white blood cell count of 11,600 on 01/29/2022, hemoglobin 11.56 with normocytic/normochromic findings, platelet count 278.  Urinalysis notable for 6-10 white blood cells, leukocyte esterase/nitrate negative.  Blood cultures x2 collected.   Imaging and additional notable ED work-up: EDP is ordered abdominal ultrasound, with result currently pending.   EDP has notified [facility], Dr. Havery Moros, Of consult request for  acute pancreatitis concerning for acute biliary source.    Subsequently, the patient was admitted for further evaluation and management of suspected acute pancreatitis, potentially on the basis of acute biliary pancreatitis given evidence of mild transaminitis with potential corresponding cholestatic pattern, with result of abdominal ultrasound currently pending, presenting with 2 to 3 days of new onset epigastric pain associate with nausea, vomiting, loose stool.     Hospital Course:  #1 acute gallstone pancreatitis -Patient presented with epigastric abdominal pain, nausea vomiting, lipase levels noted to be elevated at 1341. -Abdominal ultrasound done with gallstones with prominent CBD, no cholecystitis noted.   -Patient seen in consultation by GI, underwent MRCP which showed choledocholithiasis with mild pancreatitis 2 mm stone in the dependent portion of the distal common bile duct and gentle tapering of the duct at the level of the ampulla. -Patient subsequently underwent ERCP on 02/03/2022 consistent with choledocholithiasis, patient underwent biliary sphincterotomy and balloon extraction. -Patient seen in consultation by general surgery with no plans for laparoscopic cholecystectomy during this hospitalization as patient noted not to have an acute infection noted to be high surgical risk for cardiac complication from cardiac standpoint with CKD. -Patient was resumed back on home regimen ursodiol. -Patient maintained on IV antibiotics secondary to Enterococcus bacteremia as noted in problem #2.   -Patient be discharged in stable and improved condition with outpatient follow-up with ID. -Follow-up with general surgery as needed.   2.  Sepsis with Enterococcus bacteremia, POA -  On presentation patient noted to have a leukocytosis white count of 20.1, interval increase compared to 2 days ago, mild tachycardia. -Blood cultures obtained positive for Enterococcus with repeat blood cultures pending  with no growth to date. -Patient seen by ID, initial recommendations was to come continue ampicillin and follow-up on TEE. -TEE done per cardiology 02/04/2022 negative for endocarditis. -IV antibiotics switched to IV vancomycin per ID and subsequently placed on IV daptomycin. -Per ID patient will need PICC line placed if repeat blood cultures are negative x72 hours and will need a 2-week course of IV antibiotics from negative culture, initially early on during the hospitalization. -Patient with CKD stage IV, discussed with primary nephrologist, Dr. Hollie Salk who strongly discouraged use placement of PICC line on this patient as patient will likely be on hemodialysis in the near future and recommending single-lumen tunneled central line if long-term IV antibiotics are needed. -Due to restrictions with PICC placement, secondary to CKD, ID recommending initiation of  linezolid on discharge for 2 more weeks from negative culture with end of therapy 02/16/2022. -Patient be discharged in stable condition with outpatient follow-up with ID which has been arranged.   3.  Chronic biventricular systolic heart failure -Diuretics initially were on hold.  -Due to CKD stage IV noted not on ACE inhibitor or ARB. -Patient with some diffuse scattered crackles on 02/05/2022 which has subsequently improved initially, patient noted to have some diffuse crackles noted on day of discharge 02/11/2022.   -Patient initially resumed on Demadex 40 mg daily which was further uptitrated to home regimen of 80 mg twice daily.   -Home regimen metolazone resumed at 2.5 mg every other week on day of discharge.  -Heart failure reassessed patient on day of discharge, ReDs clip reading of 31%, heart failure team felt patient's volume status looks good and cleared patient for discharge from a cardiac standpoint with resumption of previous home cardiac meds and close outpatient follow-up which was arranged prior to discharge.  -Patient was  discharged in stable condition.     4.  Poorly controlled type 2 diabetes mellitus -Most recent hemoglobin A1c > 15.5 (01/20/2022) -Patient maintained on home regimen Levemir 15 units twice daily as well as sliding scale insulin and meal coverage NovoLog 5 units 3 times daily with meals.  -Patient noted to be on Januvia prior to admission however given presentation of pancreatitis will not resume on discharge. -Diabetes coordinator following. -Outpatient follow-up with PCP/endocrinologist. -   5.  Hypothyroidism -Patient maintained on home regimen Synthroid.   6.  Dysuria/?  UTI -Patient complains of some dysuria and suprapubic abdominal pain. -Urinalysis done concerning for UTI. -Unsure as to whether urine cultures were actually sent as results are still pending at time of discharge.. -Status post 3 days IV Rocephin.     7.  CKD stage IV -Stable. -Outpatient follow-up with nephrology as previously scheduled.   8.  Anemia of chronic kidney disease -Hemoglobin stable at 10.5 by day of discharge.   9.  Hyperlipidemia -Statin was held during the hospitalization and will be resumed on discharge.     10.  Paroxysmal atrial fibrillation, CHA2DS2VASC score 6 -Patient was maintained on amiodarone for rate control.   -Patient cleared by general surgery for resumption of Eliquis which was started 02/05/2022. -Outpatient follow-up with cardiology.   11.  Yeast infection -Patient with complaints of vaginal itching, per RN patient noted to have Candida infection in the perineal region. -Patient received Diflucan 150 mg p.o. x1, placed on clotrimazole  cream as well as clotrimazole vaginal cultures with clinical improvement.   -Outpatient follow-up with PCP.    02/18/22 Patient is here today with her husband.  She has resumed her torsemide after meeting with her cardiologist.  She does have some swelling in both legs but it is trace right now.  Her heart rate is in normal sinus rhythm today on  exam.  Her lungs are clear to auscultation bilaterally and there is no evidence of fluid overload.  Her blood pressure is good at 126/72.  She denies any dyspnea at rest or orthopnea although she does have dyspnea with activity.  She denies any residual abdominal pain.  She is tolerating oral feeds without any difficulty.  In fact she was eating fried chicken without pain recently.  There is no evidence of jaundice.  She denies any fevers or chills.  She denies any dysuria.  They were unable to remove her gallbladder.  I explained to the patient that this could happen again and therefore she needs to be cognizant of any abdominal pain and not ignore it.  She was being treated for Enterococcus septicemia and she has seen infectious disease.  They have lab work including blood cultures drawn earlier this week and likely will discontinue linezolid.  She met with endocrinology yesterday.  She has discontinued Levemir and NovoLog.  She is back on an insulin pump.  However neither the patient nor the husband could effectively explain to me how the pump works.  I went through the pump with them.  She is set to receive 1.1 units of insulin per hour on a basal rate from midnight to 5 AM.  He did increase his up to 1.8 units of insulin from breakfast until 7:30 PM and then reduce his back to 1.1 unit of insulin per hour thereafter until the following morning.  However she is checking her sugar 4 times a day and then using bolus adjustments based on the sliding scale.  Apparently her blood sugars have all been greater than 200 and she is having to bolus 8 to 10 units of insulin each time.  She has an appointment on October 2 to meet with her endocrinologist to discuss this further. Past Medical History:  Diagnosis Date   Acute MI ([place]) 1999; 2007   Anemia    hx   Anginal pain ([place])    Anxiety    ARF (acute renal failure) ([place]) 06/2017   [facility]   Arthritis    "generalized" (03/15/2014)   CAD (coronary  artery disease)    MI in 2000 - MI  2007 - treated bare metal stent (no nuclear since then as 9/11)   Carotid artery disease (HCC)    CHF (congestive heart failure) ([place])    Chronic diastolic heart failure ([place])    a) ECHO (08/2013) EF 55-60% and RV function nl b) RHC (08/2013) RA 4, RV 30/5/7, PA 25/10 (16), PCWP 7, Fick CO/CI 6.3/2.7, PVR 1.5 WU, PA 61 and 66%   Daily headache    "~ every other day; since I fell in June" (03/15/2014)   Depression    Dyslipidemia    Dyspnea    Exertional shortness of breath    HTN (hypertension)    Hypothyroidism    Neuropathy    Obesity    Osteoarthritis    Peripheral neuropathy    PONV (postoperative nausea and vomiting)    RBBB (right bundle branch block)    Old   Stroke ([place])  mini strokes   Syncope    likely due to low blood sugar   Tachycardia    Sinus tachycardia   Type II diabetes mellitus (HCC)    Type II   Urinary incontinence    Venous insufficiency    Past Surgical History:  Procedure Laterality Date   ABDOMINAL HYSTERECTOMY  1980's   AMPUTATION Right 02/24/2018   Procedure: RIGHT FOOT GREAT TOE AND 2ND TOE AMPUTATION;  Surgeon: Newt Minion, MD;  Location: [place];  Service: Orthopedics;  Laterality: Right;   AMPUTATION Right 04/30/2018   Procedure: RIGHT TRANSMETATARSAL AMPUTATION;  Surgeon: Newt Minion, MD;  Location: [place];  Service: Orthopedics;  Laterality: Right;   BIOPSY  05/27/2020   Procedure: BIOPSY;  Surgeon: [name], DO;  [facility];  Service: Endoscopy;;   CATARACT EXTRACTION, BILATERAL Bilateral ?2013   COLONOSCOPY W/ POLYPECTOMY     COLONOSCOPY WITH PROPOFOL N/A 03/13/2019   Procedure: COLONOSCOPY WITH PROPOFOL;  Surgeon: Jerene Bears, MD;  Location: [place];  Service: Gastroenterology;  Laterality: N/A;   CORONARY ANGIOPLASTY WITH STENT PLACEMENT  1999; 2007   "1 + 1"   ERCP N/A 02/03/2022   Procedure: ENDOSCOPIC RETROGRADE CHOLANGIOPANCREATOGRAPHY (ERCP);  Surgeon: Carol Ada, MD;  Location: [place];  Service: Gastroenterology;  Laterality: N/A;   ESOPHAGOGASTRODUODENOSCOPY (EGD) WITH PROPOFOL N/A 03/13/2019   Procedure: ESOPHAGOGASTRODUODENOSCOPY (EGD) WITH PROPOFOL;  Surgeon: Jerene Bears, MD;  Location: [facility] ENDOSCOPY;  Service: Gastroenterology;  Laterality: N/A;   ESOPHAGOGASTRODUODENOSCOPY (EGD) WITH PROPOFOL N/A 05/27/2020   Procedure: ESOPHAGOGASTRODUODENOSCOPY (EGD) WITH PROPOFOL;  Surgeon: [name], DO;  [facility];  Service: Endoscopy;  Laterality: N/A;   EYE SURGERY Bilateral    lazer   HEMOSTASIS CLIP PLACEMENT  03/13/2019   Procedure: HEMOSTASIS CLIP PLACEMENT;  Surgeon: Jerene Bears, MD;  Location: [facility] ENDOSCOPY;  Service: Gastroenterology;;   KNEE ARTHROSCOPY Left 10/25/2006   POLYPECTOMY  03/13/2019   Procedure: POLYPECTOMY;  Surgeon: Jerene Bears, MD;  Location: [facility] ENDOSCOPY;  Service: Gastroenterology;;   REMOVAL OF STONES  02/03/2022   Procedure: REMOVAL OF STONES;  Surgeon: Carol Ada, MD;  Location: [place];  Service: Gastroenterology;;   RIGHT HEART CATH N/A 07/24/2017   Procedure: RIGHT HEART CATH;  Surgeon: Jolaine Artist, MD;  Location: [place] CV LAB;  Service: Cardiovascular;  Laterality: N/A;   RIGHT HEART CATHETERIZATION N/A 09/22/2013   Procedure: RIGHT HEART CATH;  Surgeon: Jolaine Artist, MD;  Location: [facility] CATH LAB;  Service: Cardiovascular;  Laterality: N/A;   SHOULDER ARTHROSCOPY WITH OPEN ROTATOR CUFF REPAIR Right 03/14/2014   Procedure: RIGHT SHOULDER ARTHROSCOPY WITH BICEPS RELEASE, OPEN SUBSCAPULA REPAIR, OPEN SUPRASPINATUS REPAIR.;  Surgeon: Meredith Pel, MD;  Location: [place];  Service: Orthopedics;  Laterality: Right;   SPHINCTEROTOMY  02/03/2022   Procedure: SPHINCTEROTOMY;  Surgeon: Carol Ada, MD;  Location: [place];  Service: Gastroenterology;;   TEE WITHOUT CARDIOVERSION N/A 02/04/2022   Procedure: TRANSESOPHAGEAL ECHOCARDIOGRAM (TEE);  Surgeon: Jolaine Artist, MD;  Location: [facility] ENDOSCOPY;  Service: Cardiovascular;  Laterality: N/A;   TOE AMPUTATION Right 02/24/2018   GREAT TOE AND 2ND TOE AMPUTATION   TUBAL LIGATION  1970's   Current Outpatient Medications on File Prior to Visit  Medication Sig Dispense Refill   albuterol (VENTOLIN HFA) 108 (90 Base) MCG/ACT inhaler Inhale 1-2 puffs into the lungs every 6 (six) hours as needed for wheezing or shortness of breath.     amiodarone (  PACERONE) 200 MG tablet Take 1 tablet (200 mg total) by mouth daily. 90 tablet 3   apixaban (ELIQUIS) 5 MG TABS tablet Take 1 tablet (5 mg total) by mouth 2 (two) times daily. 180 tablet 3   aspirin EC 81 MG tablet Take 1 tablet (81 mg total) by mouth daily with breakfast. 30 tablet 11   atorvastatin (LIPITOR) 80 MG tablet TAKE 1 TABLET BY MOUTH EVERY DAY (Patient taking differently: Take 80 mg by mouth daily.) 90 tablet 1   blood glucose meter kit and supplies Use up to four times daily as directed. 1 each 0   carvedilol (COREG) 6.25 MG tablet Take 1 tablet (6.25 mg total) by mouth 2 (two) times daily with a meal. 180 tablet 3   clotrimazole (LOTRIMIN) 1 % cream Apply topically 2 (two) times daily for 7 days. 28 g 1   colchicine 0.6 MG tablet Take 0.6 mg by mouth daily as needed (gout).     Continuous Blood Gluc Sensor (FREESTYLE LIBRE 2 SENSOR) MISC      diphenoxylate-atropine (LOMOTIL) 2.5-0.025 MG tablet Take 1 tablet by mouth 4 (four) times daily as needed for diarrhea or loose stools. 30 tablet 0   ferrous sulfate 325 (65 FE) MG tablet Take 325 mg by mouth 2 (two) times daily.     Fingerstix Lancets MISC Use as directed to check blood sugars as need up to 4 times daily 100 each 0   glucose blood test strip use as directed to check blood sugars up to 4 times daily 100 each 0   insulin aspart (NOVOLOG) 100 UNIT/ML FlexPen 0-9 Units, Subcutaneous, 3 times daily with meals. CBG 70-120: 0 units CBG 121-150: 1 unit CBG 151-200: 2 units CBG 201-250: 3 units CBG  251-300: 5 units CBG 301-350: 7 units CBG 351-400: 9 units CBG>400: call MD 15 mL 11   insulin detemir (LEVEMIR) 100 UNIT/ML FlexPen Inject 15 Units into the skin 2 (two) times daily. 15 mL 11   Insulin Pen Needle 32G X 4 MM MISC Use as directed 200 each 0   isosorbide mononitrate (IMDUR) 30 MG 24 hr tablet Take 0.5 tablets (15 mg total) by mouth daily. 45 tablet 3   levothyroxine (SYNTHROID, LEVOTHROID) 50 MCG tablet Take 1 tablet (50 mcg total) by mouth daily before breakfast. 90 tablet 1   magnesium oxide (MAG-OX) 400 (240 Mg) MG tablet Take 1 tablet (400 mg total) by mouth daily. 90 tablet 3   metolazone (ZAROXOLYN) 2.5 MG tablet Take 1 tablet (2.5 mg total) by mouth as directed. Every other week 2 tablet 3   miconazole (MICONAZOLE 7) 2 % vaginal cream Place 1 Applicatorful vaginally at bedtime for 7 days. 45 g 1   nitroGLYCERIN (NITROSTAT) 0.4 MG SL tablet PLACE 1 TABLET (0.4 MG TOTAL) UNDER THE TONGUE EVERY 5 (FIVE) MINUTES AS NEEDED FOR CHEST PAIN. 25 tablet 1   Nutritional Supplements (FRUIT & VEGETABLE DAILY PO) Take 1 tablet by mouth daily.     nystatin (MYCOSTATIN/NYSTOP) powder Apply 1 Application topically 2 (two) times daily as needed for irritation.     nystatin cream (MYCOSTATIN) Apply 1 Application topically 2 (two) times daily.     OXYGEN Inhale 2 L/min into the lungs at bedtime.     polyethylene glycol (MIRALAX / GLYCOLAX) 17 g packet Take 17 g by mouth as needed for mild constipation.     pregabalin (LYRICA) 150 MG capsule Take 150 mg by mouth 2 (two) times daily.  senna-docusate (SENOKOT-S) 8.6-50 MG tablet Take 1 tablet by mouth 2 (two) times daily.     torsemide (DEMADEX) 20 MG tablet Take 4 tablets (80 mg total) by mouth 2 (two) times daily. 240 tablet 3   TRINTELLIX 10 MG TABS tablet TAKE 1 TABLET BY MOUTH EVERY DAY (Patient taking differently: Take 10 mg by mouth daily.) 30 tablet 5   ursodiol (ACTIGALL) 500 MG tablet Take 500 mg by mouth 3 (three) times daily.     No  current facility-administered medications on file prior to visit.   Allergies  Allergen Reactions   Keflex [Cephalexin] Diarrhea   Codeine Nausea And Vomiting and Other (See Comments)   Social History   Socioeconomic History   Marital status: Married    Spouse name: Percell Miller   Number of children: 3   Years of education: 12th   Highest education level: Not on file  Occupational History    Employer: UNEMPLOYED  Tobacco Use   Smoking status: Former    Packs/day: 3.00    Years: 32.00    Total pack years: 96.00    Types: Cigarettes    Quit date: 10/24/1997    Years since quitting: 24.3   Smokeless tobacco: Never  Vaping Use   Vaping Use: Never used  Substance and Sexual Activity   Alcohol use: Not Currently    Comment: "might have 2-3 daiquiris in the summer"   Drug use: No   Sexual activity: Not Currently  Other Topics Concern   Not on file  Social History Narrative   Pt lives at home with her spouse.Caffeine Use- 3 sodas daily.   Social Determinants of Health   Financial Resource Strain: Low Risk  (08/16/2021)   Overall Financial Resource Strain (CARDIA)    Difficulty of Paying Living Expenses: Not hard at all  Food Insecurity: No Food Insecurity (08/16/2021)   Hunger Vital Sign    Worried About Running Out of Food in the Last Year: Never true    Ran Out of Food in the Last Year: Never true  Transportation Needs: No Transportation Needs (08/16/2021)   PRAPARE - Hydrologist (Medical): No    Lack of Transportation (Non-Medical): No  Physical Activity: Inactive (08/16/2021)   Exercise Vital Sign    Days of Exercise per Week: 0 days    Minutes of Exercise per Session: 0 min  Stress: No Stress Concern Present (08/16/2021)   [place]    Feeling of Stress : Not at all  Social Connections: Moderately Isolated (08/16/2021)   Social Connection and Isolation Panel [NHANES]    Frequency  of Communication with Friends and Family: More than three times a week    Frequency of Social Gatherings with Friends and Family: More than three times a week    Attends Religious Services: Never    Marine scientist or Organizations: No    Attends Archivist Meetings: Never    Marital Status: Married  Human resources officer Violence: Not At Risk (08/16/2021)   Humiliation, Afraid, Rape, and Kick questionnaire    Fear of Current or Ex-Partner: No    Emotionally Abused: No    Physically Abused: No    Sexually Abused: No     Review of Systems  Gastrointestinal:  Positive for diarrhea.  Neurological:  Positive for dizziness.       Objective:   Physical Exam Constitutional:      General: She is  not in acute distress.    Appearance: Normal appearance. She is obese. She is not ill-appearing or toxic-appearing.  Cardiovascular:     Rate and Rhythm: Normal rate and regular rhythm.     Heart sounds: Normal heart sounds. No murmur heard.    No friction rub. No gallop.  Pulmonary:     Effort: Pulmonary effort is normal. No respiratory distress.     Breath sounds: Normal breath sounds. No stridor. No wheezing, rhonchi or rales.  Abdominal:     General: Bowel sounds are normal.     Palpations: Abdomen is soft.     Tenderness: There is no abdominal tenderness.  Musculoskeletal:     Right lower leg: Edema present.     Left lower leg: Edema present.  Skin:    General: Skin is warm.  Neurological:     Mental Status: She is alert.         Assessment & Plan:  Stage 4 chronic kidney disease ([place]) - Plan: CBC with Differential/Platelet, BASIC METABOLIC PANEL WITH GFR  Type 2 diabetes mellitus with diabetic polyneuropathy, with long-term current use of insulin (HCC)  Acute biliary pancreatitis without infection or necrosis  Bacteremia  Paroxysmal atrial fibrillation (HCC)  CHF (congestive heart failure), NYHA class III, unspecified failure chronicity, systolic  (HCC) From the standpoint of her congestive heart failure, the patient appears euvolemic today.  She is been checking her weight daily and her weight has been ranging between 242 and 245 consistently.  She is back on her torsemide 80 mg.  I will check a BMP today to monitor her electrolytes and renal function since resuming her diuretic.  She is unable to tolerate an ACE or an ARB due to her stage IV kidney disease on the verge of dialysis.  Regarding her kidney disease I will recheck her creatinine today.  She denies any symptoms that would suggest uremia.  There is no evidence of pancreatitis or abdominal pain.  Her bacteremia is being managed by her infectious disease specialist.  At the present time the biggest issue is her uncontrolled diabetes.  I was very blunt with the patient and her husband today.  If her A1c continues to run out of control 12-15 it is inevitable that she will be on dialysis and her life expectancy will be cut short.  I feel that the uncontrolled diabetes and the diabetic ketoacidosis likely precipitated the acute episode of congestive heart failure.  I am certain that it contributes to her kidney damage.  Therefore I told the patient that I essentially want her blood sugars to be between 100-200 every time she checks it and if not we need to adjust the basal dose to account for that.  She is meeting with her endocrinologist to discuss this further and I will defer to their management.  However I do not feel that the patient is communicating with her endocrinologist and notifying her of her sugars being out of control.  They were over 400 for quite some time despite me encouraging the patient to speak with her endocrinologist about adjusting her pump or evaluating the pump's efficacy, this did not occur.  This is the biggest obstacle moving forward with the patient is her managing her sugars and advocating for herself when the sugars are out of control.  At the present time she did not  bring her blood sugars with her today but she states they are all above 200 but less than 300.  Therefore, it appears that  they need to adjust the basal dose of her pump when she meets with the endocrinologist on 10/2.  I am also concerned that she is not checking her sugar as often as she says she is and bolusing insulin the way she is supposed to.  After a long discussion today she also reported that her depression has worsened.  She was doing well initially on Trintellix 10 mg a day but after her recent hospitalizations over the last 2 months she has found herself more depressed, more irritable, and angry with decreased energy.  We will temporarily increase Trintellix to 20 mg and see how she responds to this

## 2022-02-19 LAB — CBC WITH DIFFERENTIAL/PLATELET
Absolute Monocytes: 475 cells/uL (ref 200–950)
Basophils Absolute: 20 cells/uL (ref 0–200)
Basophils Relative: 0.3 %
Eosinophils Absolute: 554 cells/uL — ABNORMAL HIGH (ref 15–500)
Eosinophils Relative: 8.4 %
HCT: 28.2 % — ABNORMAL LOW (ref 35.0–45.0)
Hemoglobin: 9 g/dL — ABNORMAL LOW (ref 11.7–15.5)
Lymphs Abs: 554 cells/uL — ABNORMAL LOW (ref 850–3900)
MCH: 27.5 pg (ref 27.0–33.0)
MCHC: 31.9 g/dL — ABNORMAL LOW (ref 32.0–36.0)
MCV: 86.2 fL (ref 80.0–100.0)
MPV: 12.1 fL (ref 7.5–12.5)
Monocytes Relative: 7.2 %
Neutro Abs: 4996 cells/uL (ref 1500–7800)
Neutrophils Relative %: 75.7 %
Platelets: 179 10*3/uL (ref 140–400)
RBC: 3.27 10*6/uL — ABNORMAL LOW (ref 3.80–5.10)
RDW: 14.5 % (ref 11.0–15.0)
Total Lymphocyte: 8.4 %
WBC: 6.6 10*3/uL (ref 3.8–10.8)

## 2022-02-19 LAB — BASIC METABOLIC PANEL WITH GFR
BUN/Creatinine Ratio: 20 (calc) (ref 6–22)
BUN: 95 mg/dL — ABNORMAL HIGH (ref 7–25)
CO2: 26 mmol/L (ref 20–32)
Calcium: 8.7 mg/dL (ref 8.6–10.4)
Chloride: 97 mmol/L — ABNORMAL LOW (ref 98–110)
Creat: 4.87 mg/dL — ABNORMAL HIGH (ref 0.60–1.00)
Glucose, Bld: 307 mg/dL — ABNORMAL HIGH (ref 65–99)
Potassium: 5.1 mmol/L (ref 3.5–5.3)
Sodium: 135 mmol/L (ref 135–146)
eGFR: 9 mL/min/{1.73_m2} — ABNORMAL LOW (ref >=60)

## 2022-02-20 ENCOUNTER — Telehealth: Payer: Self-pay | Admitting: Internal Medicine

## 2022-02-20 LAB — GASTROINTESTINAL PATHOGEN PNL
CampyloBacter Group: NOT DETECTED
Norovirus GI/GII: NOT DETECTED
Rotavirus A: NOT DETECTED
Salmonella species: NOT DETECTED
Shiga Toxin 1: NOT DETECTED
Shiga Toxin 2: NOT DETECTED
Shigella Species: NOT DETECTED
Vibrio Group: NOT DETECTED
Yersinia enterocolitica: NOT DETECTED

## 2022-02-20 LAB — C. DIFFICILE GDH AND TOXIN A/B
GDH ANTIGEN: DETECTED
MICRO NUMBER:: 13974494
SPECIMEN QUALITY:: ADEQUATE
TOXIN A AND B: NOT DETECTED

## 2022-02-20 LAB — CLOSTRIDIUM DIFFICILE TOXIN B, QUALITATIVE, REAL-TIME PCR: Toxigenic C. Difficile by PCR: DETECTED — AB

## 2022-02-20 MED ORDER — VANCOMYCIN HCL 125 MG PO CAPS: 125.0000 mg | ORAL_CAPSULE | Freq: Four times a day (QID) | ORAL | 0 refills | Status: AC

## 2022-02-20 NOTE — Telephone Encounter (Signed)
Left voicemail asking patient to return my call.   Oreoluwa Aigner P Dot Splinter, CMA  

## 2022-02-20 NOTE — Telephone Encounter (Signed)
To triage: Cidff +, will Rx vanomycin by mouth four times per day.

## 2022-02-21 ENCOUNTER — Telehealth: Payer: Self-pay

## 2022-02-21 NOTE — Telephone Encounter (Signed)
Call from PT, Mark with [facility]. Elta Guadeloupe wanted to let you know that the patient's weight has increased from 246# to 250# today. Elta Guadeloupe states he tried to call pt's cardiologist but was unable to reach. Thanks.

## 2022-02-21 NOTE — Telephone Encounter (Signed)
Patient aware and voiced her understanding.   Susan [name] [name], CMA  

## 2022-02-22 LAB — CULTURE, BLOOD (SINGLE)
MICRO NUMBER:: 13963864
MICRO NUMBER:: 13963868
Result:: NO GROWTH
Result:: NO GROWTH
SPECIMEN QUALITY:: ADEQUATE
SPECIMEN QUALITY:: ADEQUATE

## 2022-02-22 LAB — COMPREHENSIVE METABOLIC PANEL
AG Ratio: 1.2 (calc) (ref 1.0–2.5)
ALT: 16 U/L (ref 6–29)
AST: 11 U/L (ref 10–35)
Albumin: 3.6 g/dL (ref 3.6–5.1)
Alkaline phosphatase (APISO): 181 U/L — ABNORMAL HIGH (ref 37–153)
BUN/Creatinine Ratio: 19 (calc) (ref 6–22)
BUN: 95 mg/dL — ABNORMAL HIGH (ref 7–25)
CO2: 25 mmol/L (ref 20–32)
Calcium: 8.8 mg/dL (ref 8.6–10.4)
Chloride: 97 mmol/L — ABNORMAL LOW (ref 98–110)
Creat: 5.04 mg/dL — ABNORMAL HIGH (ref 0.60–1.00)
Globulin: 2.9 g/dL (calc) (ref 1.9–3.7)
Glucose, Bld: 238 mg/dL — ABNORMAL HIGH (ref 65–99)
Potassium: 5 mmol/L (ref 3.5–5.3)
Sodium: 136 mmol/L (ref 135–146)
Total Bilirubin: 0.5 mg/dL (ref 0.2–1.2)
Total Protein: 6.5 g/dL (ref 6.1–8.1)

## 2022-02-22 LAB — CBC WITH DIFFERENTIAL/PLATELET
Absolute Monocytes: 540 cells/uL (ref 200–950)
Basophils Absolute: 21 cells/uL (ref 0–200)
Basophils Relative: 0.3 %
Eosinophils Absolute: 497 cells/uL (ref 15–500)
Eosinophils Relative: 7 %
HCT: 30.2 % — ABNORMAL LOW (ref 35.0–45.0)
Hemoglobin: 9.4 g/dL — ABNORMAL LOW (ref 11.7–15.5)
Lymphs Abs: 710 cells/uL — ABNORMAL LOW (ref 850–3900)
MCH: 26.9 pg — ABNORMAL LOW (ref 27.0–33.0)
MCHC: 31.1 g/dL — ABNORMAL LOW (ref 32.0–36.0)
MCV: 86.5 fL (ref 80.0–100.0)
MPV: 12.2 fL (ref 7.5–12.5)
Monocytes Relative: 7.6 %
Neutro Abs: 5332 cells/uL (ref 1500–7800)
Neutrophils Relative %: 75.1 %
Platelets: 217 10*3/uL (ref 140–400)
RBC: 3.49 10*6/uL — ABNORMAL LOW (ref 3.80–5.10)
RDW: 15 % (ref 11.0–15.0)
Total Lymphocyte: 10 %
WBC: 7.1 10*3/uL (ref 3.8–10.8)

## 2022-02-24 ENCOUNTER — Ambulatory Visit: Payer: HMO | Admitting: Internal Medicine

## 2022-02-24 DIAGNOSIS — I10 Essential (primary) hypertension: Secondary | ICD-10-CM | POA: Diagnosis not present

## 2022-02-24 DIAGNOSIS — E039 Hypothyroidism, unspecified: Secondary | ICD-10-CM | POA: Diagnosis not present

## 2022-02-24 DIAGNOSIS — R809 Proteinuria, unspecified: Secondary | ICD-10-CM | POA: Diagnosis not present

## 2022-02-24 DIAGNOSIS — E1165 Type 2 diabetes mellitus with hyperglycemia: Secondary | ICD-10-CM | POA: Diagnosis not present

## 2022-02-24 DIAGNOSIS — G609 Hereditary and idiopathic neuropathy, unspecified: Secondary | ICD-10-CM | POA: Diagnosis not present

## 2022-02-25 ENCOUNTER — Other Ambulatory Visit (HOSPITAL_COMMUNITY): Payer: Self-pay | Admitting: Family Medicine

## 2022-02-25 ENCOUNTER — Ambulatory Visit (HOSPITAL_COMMUNITY)
Admit: 2022-02-25 | Discharge: 2022-02-25 | Disposition: A | Discharge status: Home / Self Care | Payer: HMO | Source: Ambulatory Visit | Attending: Family Medicine | Admitting: Family Medicine

## 2022-02-25 ENCOUNTER — Encounter (HOSPITAL_COMMUNITY): Payer: Self-pay

## 2022-02-25 VITALS — BP 108/56 | HR 71 | Wt 246.0 lb

## 2022-02-25 DIAGNOSIS — E1165 Type 2 diabetes mellitus with hyperglycemia: Secondary | ICD-10-CM | POA: Diagnosis not present

## 2022-02-25 DIAGNOSIS — I5023 Acute on chronic systolic (congestive) heart failure: Secondary | ICD-10-CM | POA: Diagnosis not present

## 2022-02-25 DIAGNOSIS — I480 Paroxysmal atrial fibrillation: Secondary | ICD-10-CM | POA: Diagnosis not present

## 2022-02-25 DIAGNOSIS — I5032 Chronic diastolic (congestive) heart failure: Secondary | ICD-10-CM

## 2022-02-25 DIAGNOSIS — Z79899 Other long term (current) drug therapy: Secondary | ICD-10-CM | POA: Insufficient documentation

## 2022-02-25 DIAGNOSIS — E039 Hypothyroidism, unspecified: Secondary | ICD-10-CM | POA: Diagnosis not present

## 2022-02-25 DIAGNOSIS — E1122 Type 2 diabetes mellitus with diabetic chronic kidney disease: Secondary | ICD-10-CM | POA: Insufficient documentation

## 2022-02-25 DIAGNOSIS — Z7982 Long term (current) use of aspirin: Secondary | ICD-10-CM | POA: Diagnosis not present

## 2022-02-25 DIAGNOSIS — Z6839 Body mass index (BMI) 39.0-39.9, adult: Secondary | ICD-10-CM | POA: Diagnosis not present

## 2022-02-25 DIAGNOSIS — I5082 Biventricular heart failure: Secondary | ICD-10-CM | POA: Insufficient documentation

## 2022-02-25 DIAGNOSIS — R42 Dizziness and giddiness: Secondary | ICD-10-CM | POA: Diagnosis not present

## 2022-02-25 DIAGNOSIS — I4891 Unspecified atrial fibrillation: Secondary | ICD-10-CM

## 2022-02-25 DIAGNOSIS — A0472 Enterocolitis due to Clostridium difficile, not specified as recurrent: Secondary | ICD-10-CM | POA: Insufficient documentation

## 2022-02-25 DIAGNOSIS — M7989 Other specified soft tissue disorders: Secondary | ICD-10-CM | POA: Insufficient documentation

## 2022-02-25 DIAGNOSIS — I252 Old myocardial infarction: Secondary | ICD-10-CM | POA: Insufficient documentation

## 2022-02-25 DIAGNOSIS — R0602 Shortness of breath: Secondary | ICD-10-CM | POA: Insufficient documentation

## 2022-02-25 DIAGNOSIS — K851 Biliary acute pancreatitis without necrosis or infection: Secondary | ICD-10-CM | POA: Diagnosis not present

## 2022-02-25 DIAGNOSIS — Z9641 Presence of insulin pump (external) (internal): Secondary | ICD-10-CM | POA: Insufficient documentation

## 2022-02-25 DIAGNOSIS — D631 Anemia in chronic kidney disease: Secondary | ICD-10-CM | POA: Diagnosis not present

## 2022-02-25 DIAGNOSIS — I2510 Atherosclerotic heart disease of native coronary artery without angina pectoris: Secondary | ICD-10-CM | POA: Insufficient documentation

## 2022-02-25 DIAGNOSIS — Z7901 Long term (current) use of anticoagulants: Secondary | ICD-10-CM | POA: Diagnosis not present

## 2022-02-25 DIAGNOSIS — I130 Hypertensive heart and chronic kidney disease with heart failure and stage 1 through stage 4 chronic kidney disease, or unspecified chronic kidney disease: Secondary | ICD-10-CM | POA: Diagnosis not present

## 2022-02-25 DIAGNOSIS — N184 Chronic kidney disease, stage 4 (severe): Secondary | ICD-10-CM | POA: Insufficient documentation

## 2022-02-25 DIAGNOSIS — Z794 Long term (current) use of insulin: Secondary | ICD-10-CM | POA: Diagnosis not present

## 2022-02-25 DIAGNOSIS — Z8673 Personal history of transient ischemic attack (TIA), and cerebral infarction without residual deficits: Secondary | ICD-10-CM | POA: Insufficient documentation

## 2022-02-25 LAB — BASIC METABOLIC PANEL
Anion gap: 14 (ref 5–15)
BUN: 114 mg/dL — ABNORMAL HIGH (ref 8–23)
CO2: 24 mmol/L (ref 22–32)
Calcium: 8.3 mg/dL — ABNORMAL LOW (ref 8.9–10.3)
Chloride: 95 mmol/L — ABNORMAL LOW (ref 98–111)
Creatinine, Ser: 4.81 mg/dL — ABNORMAL HIGH (ref 0.44–1.00)
GFR, Estimated: 9 mL/min — ABNORMAL LOW (ref >60)
Glucose, Bld: 402 mg/dL — ABNORMAL HIGH (ref 70–99)
Potassium: 4.1 mmol/L (ref 3.5–5.1)
Sodium: 133 mmol/L — ABNORMAL LOW (ref 135–145)

## 2022-02-25 LAB — BRAIN NATRIURETIC PEPTIDE: B Natriuretic Peptide: 1142.6 pg/mL — ABNORMAL HIGH (ref 0.0–100.0)

## 2022-02-25 MED ORDER — METOLAZONE 2.5 MG PO TABS: ORAL_TABLET | ORAL | 3 refills | Status: DC

## 2022-02-25 MED ORDER — TORSEMIDE 20 MG PO TABS: 80.0000 mg | ORAL_TABLET | Freq: Two times a day (BID) | ORAL | 3 refills | Status: DC

## 2022-02-25 NOTE — Progress Notes (Signed)
Advanced Heart Failure Clinic Note   PCP:  [name], MD Nephrology: Dr Hollie Salk HF Cardiologist: Glori Bickers, MD   Reason for Visit:  F/u for Chronic Diastolic Heart Failure  HPI:  Susan [name] is a 71 y.o. female with morbid obesity, RBBB, DM2, Hypothyroidism diastolic heart failure, CAD MI 2000/2007 BMS 2007, TIA, CKD Stage IV and PAD s/p R transmetatarsal amputation.   Has had frequent hospital admits since 2017.  Had home sleep study 07/25/20 and this was negative for OSA.   Admitted 3/22  with AKI and progressive volume overloaded in setting of recurrent UTI and probable ATN. She was placed on IV abx and IV lasix w/ improved renal function and volume status. SCr was down to 1.78 day of d/c. PT recommended SNF however pt declined. She was discharged home on 3/27 but readmitted 2 days later for urinary retention and recurrent AKI. Marland Kitchen  Admitted 10/22 with NSTEMI (hstrop 247). Echo with decline in EF to 30-35% and new inferior WMA. Suspect progression in RCA disease.Managed medically due to CKD IV (Cr ~3.5).  AHF consulted for management. GDMT limited by renal disease. Hospitalization c/b AKI and UTI. Discharge weight 268 lbs.  Echo 7/23 showed EF 25-30%, severe LV dysfunction, grade II DD, RV mildly reduced.  Admitted 8/23 with DKA. Blood glucose > 1200. Ltd Echo showed EF 25-30%, RV moderately down. Hospitalization c/b AFib with RVR. Cards consulted and she was started on amio and Eliquis. PT rec SNF/CIR, she declined. She was discharged home, weight 244 lbs.  Follow up 9/23, volume stable on torsemide 80 mg daily + metolazone 2.5 every other week; weight 236 lbs. She had LUE swelling after IV infiltration. Doppler arranged and showed fascia irregularity, but no clot.  Admitted 9/23 with gallstone pancreatitis and CBD stone s/p extraction in addition to Enterococcus faecalis and E. Faecium bacteremia. TEE was negative for endocarditis, LVEF 30%, RV mod HK w/ severe TR. She  was improving so planned for conservative management w/o surgery, per general surgery. AHF team consulted for help with volume management. Resumed on metolazone every other week and Coreg. GDMT limited due to low BP and CKD. Received IV abx per ID, no PICC due to likely near-future need to dialysis, and initiated on linezolid at discharge x 2 weeks. Discharged home, weight 235 lbs. Of note, C-Diff + after discharge, and started on po vanco qid by ID.  Today she returns for post hospital HF follow up with her husband. Overall feeling poorly. Has not been able to work with PT due to weakness and diarrhea and frequent MD visits. She is more SOB and legs are swelling./ Husband wrapped her RLE due to weeping. Saw Nephrology this week and advised to decrease torsemide to 60  bid due to worsening kidney function. Has some dizziness. Denies palpitations, CP, or PND/Orthopnea. Appetite ok. No fever or chills. Weight at home 235-246 pounds. Taking all medications. Now back on OmniPod. Drinking a lot of fluids.  Cardiac Studies:  - TEE (9/23): EF 30%, LV moderately decreased function, RV moderately decreased function, no valvular vegetation, severe TR, moderate plaque in descending aorta.  - Echo (9/23): EF 30%, severe LV dysfunction, RV to mildly reduced, severe TR.  - Ltd Echo (8/23): EF 30-35%, moderately decreased LV, RV moderately reduced, mild to moderate TR  - Echo (7/23): EF 25-30%, severe LV dysfunction, grade II DD, RV mildly reduced.  - Echo (10/22): EF 30-35%  - Echo (9/21): EF 55-60%, RV ok  -  Echo (10/19): EF 55-60%, Grade I DD  - RHC (3/19): showed mild PAH with evidence of RV strain likely due to OHS/OSA.   RA = 17 RV = 48/15 PA = 49/11 (30) PCW = 18 Fick cardiac output/index = 7.0/3.0 PVR = 0.5 WU Ao sat = 97% PA sat = 62%, 64%  1. Normal left-sided pressures 2. Mild PAH with evidence of RV strain likely due to OHS/OSA 3. Normal cardiac output  - Lexiscan w/ stress echo  (2018): EF 68%, low risk  - LHC (2007): BMS distal RCA  - Cardiolite (03/2002): EF of 51% but no ischemia  - LHC (2000): BMS RCA   ROS: All systems reviewed and negative except as per HPI.   Allergies  Allergen Reactions   Keflex [Cephalexin] Diarrhea   Codeine Nausea And Vomiting and Other (See Comments)   Past Medical History:  Diagnosis Date   Acute MI ([place]) 1999; 2007   Anemia    hx   Anginal pain ([place])    Anxiety    ARF (acute renal failure) ([place]) 06/2017   [facility] Kidney Asso   Arthritis    "generalized" (03/15/2014)   CAD (coronary artery disease)    MI in 2000 - MI  2007 - treated bare metal stent (no nuclear since then as 9/11)   Carotid artery disease (HCC)    CHF (congestive heart failure) ([place])    Chronic diastolic heart failure ([place])    a) ECHO (08/2013) EF 55-60% and RV function nl b) RHC (08/2013) RA 4, RV 30/5/7, PA 25/10 (16), PCWP 7, Fick CO/CI 6.3/2.7, PVR 1.5 WU, PA 61 and 66%   Daily headache    "~ every other day; since I fell in June" (03/15/2014)   Depression    Dyslipidemia    Dyspnea    Exertional shortness of breath    HTN (hypertension)    Hypothyroidism    Neuropathy    Obesity    Osteoarthritis    Peripheral neuropathy    PONV (postoperative nausea and vomiting)    RBBB (right bundle branch block)    Old   Stroke ([place])    mini strokes   Syncope    likely due to low blood sugar   Tachycardia    Sinus tachycardia   Type II diabetes mellitus (HCC)    Type II   Urinary incontinence    Venous insufficiency    Family History  Problem Relation Age of Onset   Heart attack Mother 81   Past Surgical History:  Procedure Laterality Date   ABDOMINAL HYSTERECTOMY  1980's   AMPUTATION Right 02/24/2018   Procedure: RIGHT FOOT GREAT TOE AND 2ND TOE AMPUTATION;  Surgeon: Newt Minion, MD;  Location: [place];  Service: Orthopedics;  Laterality: Right;   AMPUTATION Right 04/30/2018   Procedure: RIGHT TRANSMETATARSAL AMPUTATION;  Surgeon:  Newt Minion, MD;  Location: [place];  Service: Orthopedics;  Laterality: Right;   BIOPSY  05/27/2020   Procedure: BIOPSY;  Surgeon: [name], DO;  [facility];  Service: Endoscopy;;   CATARACT EXTRACTION, BILATERAL Bilateral ?2013   COLONOSCOPY W/ POLYPECTOMY     COLONOSCOPY WITH PROPOFOL N/A 03/13/2019   Procedure: COLONOSCOPY WITH PROPOFOL;  Surgeon: Jerene Bears, MD;  Location: [place];  Service: Gastroenterology;  Laterality: N/A;   CORONARY ANGIOPLASTY WITH STENT PLACEMENT  1999; 2007   "1 + 1"   ERCP N/A 02/03/2022   Procedure: ENDOSCOPIC RETROGRADE CHOLANGIOPANCREATOGRAPHY (ERCP);  Surgeon: Benson Norway,  Saralyn Pilar, MD;  Location: [place];  Service: Gastroenterology;  Laterality: N/A;   ESOPHAGOGASTRODUODENOSCOPY (EGD) WITH PROPOFOL N/A 03/13/2019   Procedure: ESOPHAGOGASTRODUODENOSCOPY (EGD) WITH PROPOFOL;  Surgeon: Jerene Bears, MD;  Location: [facility] ENDOSCOPY;  Service: Gastroenterology;  Laterality: N/A;   ESOPHAGOGASTRODUODENOSCOPY (EGD) WITH PROPOFOL N/A 05/27/2020   Procedure: ESOPHAGOGASTRODUODENOSCOPY (EGD) WITH PROPOFOL;  Surgeon: [name], DO;  [facility];  Service: Endoscopy;  Laterality: N/A;   EYE SURGERY Bilateral    lazer   HEMOSTASIS CLIP PLACEMENT  03/13/2019   Procedure: HEMOSTASIS CLIP PLACEMENT;  Surgeon: Jerene Bears, MD;  Location: [facility] ENDOSCOPY;  Service: Gastroenterology;;   KNEE ARTHROSCOPY Left 10/25/2006   POLYPECTOMY  03/13/2019   Procedure: POLYPECTOMY;  Surgeon: Jerene Bears, MD;  Location: [facility] ENDOSCOPY;  Service: Gastroenterology;;   REMOVAL OF STONES  02/03/2022   Procedure: REMOVAL OF STONES;  Surgeon: Carol Ada, MD;  Location: [place];  Service: Gastroenterology;;   RIGHT HEART CATH N/A 07/24/2017   Procedure: RIGHT HEART CATH;  Surgeon: Jolaine Artist, MD;  Location: [place] [facility];  Service: Cardiovascular;  Laterality: N/A;   RIGHT HEART CATHETERIZATION N/A 09/22/2013   Procedure: RIGHT HEART  CATH;  Surgeon: Jolaine Artist, MD;  Location: [facility] CATH LAB;  Service: Cardiovascular;  Laterality: N/A;   SHOULDER ARTHROSCOPY WITH OPEN ROTATOR CUFF REPAIR Right 03/14/2014   Procedure: RIGHT SHOULDER ARTHROSCOPY WITH BICEPS RELEASE, OPEN SUBSCAPULA REPAIR, OPEN SUPRASPINATUS REPAIR.;  Surgeon: Meredith Pel, MD;  Location: [place];  Service: Orthopedics;  Laterality: Right;   SPHINCTEROTOMY  02/03/2022   Procedure: SPHINCTEROTOMY;  Surgeon: Carol Ada, MD;  Location: [place];  Service: Gastroenterology;;   TEE WITHOUT CARDIOVERSION N/A 02/04/2022   Procedure: TRANSESOPHAGEAL ECHOCARDIOGRAM (TEE);  Surgeon: Jolaine Artist, MD;  Location: [facility] ENDOSCOPY;  Service: Cardiovascular;  Laterality: N/A;   TOE AMPUTATION Right 02/24/2018   GREAT TOE AND 2ND TOE AMPUTATION   TUBAL LIGATION  1970's   Social History   Socioeconomic History   Marital status: Married    Spouse name: Percell Miller   Number of children: 3   Years of education: 12th   Highest education level: Not on file  Occupational History    Employer: UNEMPLOYED  Tobacco Use   Smoking status: Former    Packs/day: 3.00    Years: 32.00    Total pack years: 96.00    Types: Cigarettes    Quit date: 10/24/1997    Years since quitting: 24.3   Smokeless tobacco: Never  Vaping Use   Vaping Use: Never used  Substance and Sexual Activity   Alcohol use: Not Currently    Comment: "might have 2-3 daiquiris in the summer"   Drug use: No   Sexual activity: Not Currently  Other Topics Concern   Not on file  Social History Narrative   Pt lives at home with her spouse.Caffeine Use- 3 sodas daily.   Social Determinants of Health   Financial Resource Strain: Low Risk  (08/16/2021)   Overall Financial Resource Strain (CARDIA)    Difficulty of Paying Living Expenses: Not hard at all  Food Insecurity: No Food Insecurity (08/16/2021)   Hunger Vital Sign    Worried About Running Out of Food in the Last Year: Never true    Ran Out  of Food in the Last Year: Never true  Transportation Needs: No Transportation Needs (08/16/2021)   PRAPARE - Transportation    Lack of Transportation (Medical): No    Lack of  Transportation (Non-Medical): No  Physical Activity: Inactive (08/16/2021)   Exercise Vital Sign    Days of Exercise per Week: 0 days    Minutes of Exercise per Session: 0 min  Stress: No Stress Concern Present (08/16/2021)   [place]    Feeling of Stress : Not at all  Social Connections: Moderately Isolated (08/16/2021)   Social Connection and Isolation Panel [NHANES]    Frequency of Communication with Friends and Family: More than three times a week    Frequency of Social Gatherings with Friends and Family: More than three times a week    Attends Religious Services: Never    Marine scientist or Organizations: No    Attends Archivist Meetings: Never    Marital Status: Married  Human resources officer Violence: Not At Risk (08/16/2021)   Humiliation, Afraid, Rape, and Kick questionnaire    Fear of Current or Ex-Partner: No    Emotionally Abused: No    Physically Abused: No    Sexually Abused: No   Lipid Panel     Component Value Date/Time   CHOL 92 02/01/2022 0615   TRIG 143 02/01/2022 0615   HDL 34 (L) 02/01/2022 0615   CHOLHDL 2.7 02/01/2022 0615   VLDL 29 02/01/2022 0615   LDLCALC 29 02/01/2022 0615   LDLCALC 65 01/17/2019 1608   Current Outpatient Medications on File Prior to Encounter  Medication Sig Dispense Refill   albuterol (VENTOLIN HFA) 108 (90 Base) MCG/ACT inhaler Inhale 1-2 puffs into the lungs every 6 (six) hours as needed for wheezing or shortness of breath.     amiodarone (PACERONE) 200 MG tablet Take 1 tablet (200 mg total) by mouth daily. 90 tablet 3   apixaban (ELIQUIS) 5 MG TABS tablet Take 1 tablet (5 mg total) by mouth 2 (two) times daily. 180 tablet 3   aspirin EC 81 MG tablet Take 1 tablet (81 mg total) by  mouth daily with breakfast. 30 tablet 11   atorvastatin (LIPITOR) 80 MG tablet Take 80 mg by mouth daily.     blood glucose meter kit and supplies Use up to four times daily as directed. 1 each 0   carvedilol (COREG) 6.25 MG tablet Take 1 tablet (6.25 mg total) by mouth 2 (two) times daily with a meal. 180 tablet 3   colchicine 0.6 MG tablet Take 0.6 mg by mouth daily as needed (gout).     Continuous Blood Gluc Sensor (FREESTYLE LIBRE 2 SENSOR) MISC      diphenoxylate-atropine (LOMOTIL) 2.5-0.025 MG tablet Take 1 tablet by mouth 4 (four) times daily as needed for diarrhea or loose stools. 30 tablet 0   ferrous sulfate 325 (65 FE) MG tablet Take 325 mg by mouth 2 (two) times daily.     Fingerstix Lancets MISC Use as directed to check blood sugars as need up to 4 times daily 100 each 0   glucose blood test strip use as directed to check blood sugars up to 4 times daily 100 each 0   Insulin Pen Needle 32G X 4 MM MISC Use as directed 200 each 0   isosorbide mononitrate (IMDUR) 30 MG 24 hr tablet Take 0.5 tablets (15 mg total) by mouth daily. 45 tablet 3   levothyroxine (SYNTHROID, LEVOTHROID) 50 MCG tablet Take 1 tablet (50 mcg total) by mouth daily before breakfast. 90 tablet 1   magnesium oxide (MAG-OX) 400 (240 Mg) MG tablet Take 1 tablet (400 mg  total) by mouth daily. 90 tablet 3   metolazone (ZAROXOLYN) 2.5 MG tablet Take 1 tablet (2.5 mg total) by mouth as directed. Every other week 2 tablet 3   nitroGLYCERIN (NITROSTAT) 0.4 MG SL tablet PLACE 1 TABLET (0.4 MG TOTAL) UNDER THE TONGUE EVERY 5 (FIVE) MINUTES AS NEEDED FOR CHEST PAIN. 25 tablet 1   Nutritional Supplements (FRUIT & VEGETABLE DAILY PO) Take 1 tablet by mouth daily.     nystatin (MYCOSTATIN/NYSTOP) powder Apply 1 Application topically 2 (two) times daily as needed for irritation.     nystatin cream (MYCOSTATIN) Apply 1 Application topically 2 (two) times daily.     OXYGEN Inhale 2 L/min into the lungs at bedtime.     polyethylene  glycol (MIRALAX / GLYCOLAX) 17 g packet Take 17 g by mouth as needed for mild constipation.     pregabalin (LYRICA) 150 MG capsule Take 150 mg by mouth 2 (two) times daily.     senna-docusate (SENOKOT-S) 8.6-50 MG tablet Take 1 tablet by mouth 2 (two) times daily. (Patient taking differently: Take 1 tablet by mouth 2 (two) times daily. As needed)     torsemide (DEMADEX) 20 MG tablet Patient takes 3 tablets by mouth twice a day.     TRINTELLIX 10 MG TABS tablet TAKE 1 TABLET BY MOUTH EVERY DAY (Patient taking differently: Take 20 mg by mouth daily.) 30 tablet 5   ursodiol (ACTIGALL) 500 MG tablet Take 500 mg by mouth 3 (three) times daily.     vancomycin (VANCOCIN) 125 MG capsule Take 1 capsule (125 mg total) by mouth 4 (four) times daily for 10 days. 40 capsule 0   No current facility-administered medications on file prior to encounter.   Wt Readings from Last 3 Encounters:  02/25/22 111.6 kg (246 lb)  02/18/22 111.1 kg (245 lb)  02/11/22 107 kg (235 lb 14.3 oz)   BP (!) 108/56   Pulse 71   Wt 111.6 kg (246 lb) Comment: patient's home weight(refused to get weighed today in our office.  SpO2 95%   BMI 39.71 kg/m   PHYSICAL EXAM: General:  NAD. No resp difficulty, arrived in [facility], chronically-ill appearing. HEENT: Normal Neck: Supple. JVP to jaw, + v waves. Carotids 2+ bilat; no bruits. No lymphadenopathy or thryomegaly appreciated. Cor: PMI nondisplaced. Regular rate & rhythm. No rubs, gallops, 2/6 TR Lungs: Clear Abdomen: Soft, nontender, nondistended. No hepatosplenomegaly. No bruits or masses. Good bowel sounds. Extremities: No cyanosis, clubbing, rash, 2+ BLE edema to klnees, RLE wrapped; compression hose on Neuro: Alert & oriented x 3, cranial nerves grossly intact. Moves all 4 extremities w/o difficulty. Affect pleasant.  ReDs: 46%  ECG (personally reviewed): NSR, T wave flattening in anterior leads.  ASSESSMENT AND PLAN:  Acute on Chronic Biventricular CHF - Echo (9/21): EF  55-60%, RV normal  - Echo (10/22): EF down to 30-35% in setting of NSTEMI, Grade II DD. - Echo (7/23): EF 25-30%, RV mildly reduced. - Ltd Echo (8/23): EF 30-35%, RV moderately reduced - Echo (9/23): EF 30%, interventricular septum flattened in diastole consistent with RV volume overload, RV low normal, severe TR - NYHA III, functional status confounded by physical deconditioning and chronic health issues. Volume up today, weight up 11 lbs from discharge, ReDs 46%.  - GDMT limited by CKD. - Increase torsemide back to 80 mg bid. - Increase metolazone to 2.5 mg/20 KCL every Tuesday (previously on every other week). Take a dose today and tomorrow. - Continue Coreg 6.25 mg bid. -  Labs today. BMET in 7-10 days.   2. CAD  - Known CAD w/ h/o remote MI in 2000 s/p BMS to RCA and again in 2007 w/ BMS to RCA  - Admitted 10/22 NSTEM (hstrop 247) Echo with decline in EF to 30-35% and new inferior WMA. Suspect progression in RCA disease. No cath due CKD IV - Rare angina (stable).  - Not candidate for cath with CKD IV unless ACS or persistent, severe angina. - Continue atorvastatin 80 mg daily. - Continue ASA. Now off Plavix due to need for [facility] - Continue beta blocker. - Continue Imdur 15 mg daily.   3. h/o Gallstone pancreatitis - Lipase 1341>112>60 - s/p ERCP on 02/03/22 with biliary sphincterotomy and balloon extraction. - No evidence of cholecystitis on imaging. - Her surgical risk for cardiac complications and progression of CKD to point of dialysis is high. Fortunately, she improved with conservative treatment and currently no indication for surgery.    4. h/o Enterococcus faecalis and E faecium bacteremia - Repeat BC X 2 09/11 NGTD - GI source suspected. - No evidence of endocarditis on TEE - ID following and she is now off linezolid.   5 . Paroxysmal AF/AFL - New during 8/23 admit. - Currently NSR.  - Continue amiodarone 200 mg daily. - Continue Eliquis 5 mg bid. No bleeding issues.    6. CKD IV - Baseline Scr ~ 3.5. Most recent SCr 4.87 at PCP visit 02/18/22 - Labs today. - Followed by Nephrology; likely nearing dialysis. ? Candidacy. Will send a message to Dr. Hollie Salk.   7. Uncontrolled DM II - Hgb A1c 15 in 08/23 - She is back on insulin pump - No SGLT2i given poor control.   8. Anemia of CKD - CBC at PCP follow up (02/18/22) reviewed, Hgb 9.0   9. C-Difficile infection - Now on po vanc, per ID.  Follow up next month with Dr. Haroldine Laws, as scheduled.  [name], FNP-BC 02/25/22

## 2022-02-25 NOTE — Patient Instructions (Signed)
INCREASE Torsemide to 80 mg twice a day  START Metolazone 2.5 mg one tab daily for two days, then one tab weekly on Tuesday Be sure to take an additional 20 meq of potassium with every metolazone dose  Labs today We will only contact you if something comes back abnormal or we need to make some changes. Otherwise no news is good news!  Labs needed in 7-10 days   Keep follow up as scheduled with Dr Haroldine Laws   Do the following things EVERYDAY: Weigh yourself in the morning before breakfast. Write it down and keep it in a log. Take your medicines as prescribed Eat low salt foods--Limit salt (sodium) to 2000 mg per day.  Stay as active as you can everyday Limit all fluids for the day to less than 2 liters   At the [facility], you and your health needs are our priority. As part of our continuing mission to provide you with exceptional heart care, we have created designated Provider Care Teams. These Care Teams include your primary Cardiologist (physician) and Advanced Practice Providers (APPs- Physician Assistants and Nurse Practitioners) who all work together to provide you with the care you need, when you need it.   You may see any of the following providers on your designated Care Team at your next follow up: Dr Glori Bickers Dr Loralie Champagne Dr. Roxana Hires, NP Lyda Jester, [facility] [place], [place] Forestine Na, NP Audry Riles, PharmD   Please be sure to bring in all your medications bottles to every appointment.

## 2022-02-25 NOTE — Progress Notes (Signed)
ReDS Vest / Clip - 02/25/22 1013       ReDS Vest / Clip   Station Marker C    Ruler Value 31.5    ReDS Value Range High volume overload    ReDS Actual Value 46

## 2022-02-27 ENCOUNTER — Telehealth: Payer: Self-pay

## 2022-02-27 ENCOUNTER — Telehealth: Payer: Self-pay | Admitting: Pharmacist

## 2022-02-27 NOTE — Telephone Encounter (Signed)
Call from RN with [facility], Lurline Idol, states pt's weight was 249# yesterday with fasting BS of 189. Thank you.

## 2022-02-27 NOTE — Progress Notes (Signed)
Chronic Care Management Pharmacy Assistant   Name: Susan [name]  MRN: [id number] DOB: March 26, 1951   Reason for Encounter: Disease State - Hypertension Call     Recent office visits:  02/18/22 Susan Luo, MD - Family Medicine - Stage 4 CKD - Labs were ordered. Increased Trintellix to 73m daily. Follow up as scheduled.   Recent consult visits:  02/25/22 Susan [name]- Congestive heart failure - Cardiology - Labs were ordered   02/17/22 Susan RecordMD - BCalumetwere ordered. metolazone (ZAROXOLYN) 2.5 MG tablet and torsemide (DEMADEX) 20 MG tablet (4 tablets twice daily = 846mBID)  Hospital visits: 01/31/22 - 02/11/22 Medication Reconciliation was completed by comparing discharge summary, patient's EMR and Pharmacy list, and upon discussion with patient.  Admitted to the hospital on 01/31/22 due to Pancreatis. Discharge date was 02/11/22. [facility] Antifungal Clotrimazole (clotrimazole) linezolid (Zyvox) senna-docusate (Senokot-S) V-R MICONAZOLE 7 (miconazole)  Medication Changes at Hospital Discharge: None noted.  Medications Discontinued at Hospital Discharge: Januvia 25 MG tablet (sitaGLIPtin)  Medications that remain the same after Hospital Discharge:??  All other medications will remain the same.    Medications: Outpatient Encounter Medications as of 02/27/2022  Medication Sig   albuterol (VENTOLIN HFA) 108 (90 Base) MCG/ACT inhaler Inhale 1-2 puffs into the lungs every 6 (six) hours as needed for wheezing or shortness of breath.   amiodarone (PACERONE) 200 MG tablet Take 1 tablet (200 mg total) by mouth daily.   apixaban (ELIQUIS) 5 MG TABS tablet Take 1 tablet (5 mg total) by mouth 2 (two) times daily.   aspirin EC 81 MG tablet Take 1 tablet (81 mg total) by mouth daily with breakfast.   atorvastatin (LIPITOR) 80 MG tablet Take 80 mg by mouth daily.   blood glucose meter  kit and supplies Use up to four times daily as directed.   carvedilol (COREG) 6.25 MG tablet Take 1 tablet (6.25 mg total) by mouth 2 (two) times daily with a meal.   colchicine 0.6 MG tablet Take 0.6 mg by mouth daily as needed (gout).   Continuous Blood Gluc Sensor (FREESTYLE LIBRE 2 SENSOR) MISC    diphenoxylate-atropine (LOMOTIL) 2.5-0.025 MG tablet Take 1 tablet by mouth 4 (four) times daily as needed for diarrhea or loose stools.   ferrous sulfate 325 (65 FE) MG tablet Take 325 mg by mouth 2 (two) times daily.   Fingerstix Lancets MISC Use as directed to check blood sugars as need up to 4 times daily   glucose blood test strip use as directed to check blood sugars up to 4 times daily   Insulin Pen Needle 32G X 4 MM MISC Use as directed   isosorbide mononitrate (IMDUR) 30 MG 24 hr tablet Take 0.5 tablets (15 mg total) by mouth daily.   levothyroxine (SYNTHROID, LEVOTHROID) 50 MCG tablet Take 1 tablet (50 mcg total) by mouth daily before breakfast.   magnesium oxide (MAG-OX) 400 (240 Mg) MG tablet Take 1 tablet (400 mg total) by mouth daily.   metolazone (ZAROXOLYN) 2.5 MG tablet Take 1 tablet (2.5 mg total) by mouth daily for 2 days, THEN 1 tablet (2.5 mg total) once a week. Every Tuesday with an additional 20 meq of potassium.   nitroGLYCERIN (NITROSTAT) 0.4 MG SL tablet PLACE 1 TABLET (0.4 MG TOTAL) UNDER THE TONGUE EVERY 5 (FIVE) MINUTES AS NEEDED FOR CHEST PAIN.   Nutritional Supplements (FRUIT & VEGETABLE DAILY  PO) Take 1 tablet by mouth daily.   nystatin (MYCOSTATIN/NYSTOP) powder Apply 1 Application topically 2 (two) times daily as needed for irritation.   nystatin cream (MYCOSTATIN) Apply 1 Application topically 2 (two) times daily.   OXYGEN Inhale 2 L/min into the lungs at bedtime.   polyethylene glycol (MIRALAX / GLYCOLAX) 17 g packet Take 17 g by mouth as needed for mild constipation.   pregabalin (LYRICA) 150 MG capsule Take 150 mg by mouth 2 (two) times daily.   senna-docusate  (SENOKOT-S) 8.6-50 MG tablet Take 1 tablet by mouth 2 (two) times daily. (Patient taking differently: Take 1 tablet by mouth 2 (two) times daily. As needed)   torsemide (DEMADEX) 20 MG tablet Take 4 tablets (80 mg total) by mouth 2 (two) times daily.   TRINTELLIX 10 MG TABS tablet TAKE 1 TABLET BY MOUTH EVERY DAY (Patient taking differently: Take 20 mg by mouth daily.)   ursodiol (ACTIGALL) 500 MG tablet Take 500 mg by mouth 3 (three) times daily.   vancomycin (VANCOCIN) 125 MG capsule Take 1 capsule (125 mg total) by mouth 4 (four) times daily for 10 days.   No facility-administered encounter medications on file as of 02/27/2022.    Current antihypertensive regimen:  Torsemide (DEMADEX) 20 MG tablet isosorbide mononitrate (IMDUR) 30 MG 24 hr tablet carvedilol (COREG) 6.25 MG tablet amiodarone (PACERONE) 200 MG tablet Metalozone 2.5 mg   How often are you checking your Blood Pressure?  Patient reported checking blood pressures  Current home BP readings:     What recent interventions/DTPs have been made by any provider to improve Blood Pressure control since last CPP Visit:  Patient rx'd metolazone (ZAROXOLYN) 2.5 MG tablet and torsemide (DEMADEX) 20 MG tablet (4 tablets twice daily = 45m BID) at hospital follow up on 02/25/22   Any recent hospitalizations or ED visits since last visit with CPP? Patient had a hospitalization on 01/31/22 - 02/11/22   What diet changes have been made to improve Blood Pressure Control?  Patient follows a low sodium diet.   What exercise is being done to improve your Blood Pressure Control?   Patient reported    Adherence Review: Is the patient currently on ACE/ARB medication? No Does the patient have >5 day gap between last estimated fill dates? No      Care Gaps   AWV: done 08/16/21 Colonoscopy: done 03/13/19 DM Eye Exam: due 09/24/18 DM Foot Exam: unknown  Microalbumin: done 06/29/20 HbgAIC: done 01/14/22 (>15.5) DEXA: unknown  Mammogram: done  11/22/15     Star Rating Drugs: Atorvastatin (LIPITOR) 80 MG tablet - last filled 12/05/21 90 days     Future Appointments  Date Time Provider [place] 03/03/2022  3:30 PM DNewt Minion MD [facility] None  03/06/2022  1:45 PM [facility] LAB [facility] None  03/06/2022  2:45 PM SLaurice Record MD [facility]  04/01/2022  1:40 PM Bensimhon, DShaune Pascal MD [facility] None  08/28/2022  3:30 PM BSFM-NURSE HEALTH ADVISOR BSFM-BS[facility] P[facility]   Multiple attempts were made to contact patient. Attempts were unsuccessful. / ls,CMA   LJobe [place] CRowes RunPharmacist Assistant  [phone]

## 2022-02-28 ENCOUNTER — Other Ambulatory Visit: Payer: Self-pay | Admitting: Family Medicine

## 2022-02-28 ENCOUNTER — Other Ambulatory Visit: Payer: Self-pay

## 2022-02-28 DIAGNOSIS — F331 Major depressive disorder, recurrent, moderate: Secondary | ICD-10-CM

## 2022-02-28 DIAGNOSIS — E119 Type 2 diabetes mellitus without complications: Secondary | ICD-10-CM | POA: Diagnosis not present

## 2022-02-28 DIAGNOSIS — Z794 Long term (current) use of insulin: Secondary | ICD-10-CM | POA: Diagnosis not present

## 2022-02-28 DIAGNOSIS — Z6841 Body Mass Index (BMI) 40.0 and over, adult: Secondary | ICD-10-CM | POA: Diagnosis not present

## 2022-02-28 MED ORDER — VORTIOXETINE HBR 10 MG PO TABS: 20.0000 mg | ORAL_TABLET | Freq: Every day | ORAL | 3 refills | Status: DC

## 2022-02-28 NOTE — Telephone Encounter (Signed)
Requested medication (s) are due for refill today:   Prescribed today 10/6  Requested medication (s) are on the active medication list:   Yes  Future visit scheduled:   No   Last ordered: Today  See pharmacy note    Requested Prescriptions  Pending Prescriptions Disp Refills   TRINTELLIX 10 MG TABS tablet [Pharmacy Med Name: [place] 10 MG TABLET] 90 tablet 3    Sig: TAKE 2 TABLETS BY MOUTH EVERY DAY     Psychiatry: Antidepressants - Serotonin Modulator Failed - 02/28/2022  8:07 AM      Failed - Valid encounter within last 6 months    Recent Outpatient Visits           6 months ago Cellulitis of lower extremity, unspecified laterality   [place] [name], MD   6 months ago Diarrhea, unspecified type   [place] [name], [name], MD   7 months ago [place] [name], MD   7 months ago Stage 4 chronic kidney disease ([place])   [place] [name], MD   7 months ago Chronic diastolic CHF (congestive heart failure) ([place])   [place] [name], [name], MD       Future Appointments             In 3 days Newt Minion, MD [place]   In 6 days [name], MD [facility], [facility]            Passed - Completed PHQ-2 or PHQ-9 in the last 360 days

## 2022-03-02 ENCOUNTER — Other Ambulatory Visit (HOSPITAL_COMMUNITY): Payer: Self-pay | Admitting: Family Medicine

## 2022-03-02 ENCOUNTER — Other Ambulatory Visit (HOSPITAL_COMMUNITY): Payer: Self-pay | Admitting: Internal Medicine

## 2022-03-03 ENCOUNTER — Encounter: Payer: Self-pay | Admitting: Orthopedic Surgery

## 2022-03-03 ENCOUNTER — Ambulatory Visit (INDEPENDENT_AMBULATORY_CARE_PROVIDER_SITE_OTHER): Payer: PPO | Admitting: Orthopedic Surgery

## 2022-03-03 DIAGNOSIS — B351 Tinea unguium: Secondary | ICD-10-CM

## 2022-03-03 DIAGNOSIS — L97929 Non-pressure chronic ulcer of unspecified part of left lower leg with unspecified severity: Secondary | ICD-10-CM | POA: Diagnosis not present

## 2022-03-03 DIAGNOSIS — I83019 Varicose veins of right lower extremity with ulcer of unspecified site: Secondary | ICD-10-CM

## 2022-03-03 DIAGNOSIS — L97919 Non-pressure chronic ulcer of unspecified part of right lower leg with unspecified severity: Secondary | ICD-10-CM

## 2022-03-03 DIAGNOSIS — Z89431 Acquired absence of right foot: Secondary | ICD-10-CM | POA: Diagnosis not present

## 2022-03-03 DIAGNOSIS — I83029 Varicose veins of left lower extremity with ulcer of unspecified site: Secondary | ICD-10-CM | POA: Diagnosis not present

## 2022-03-03 DIAGNOSIS — L97411 Non-pressure chronic ulcer of right heel and midfoot limited to breakdown of skin: Secondary | ICD-10-CM

## 2022-03-03 DIAGNOSIS — I87333 Chronic venous hypertension (idiopathic) with ulcer and inflammation of bilateral lower extremity: Secondary | ICD-10-CM | POA: Diagnosis not present

## 2022-03-03 NOTE — Progress Notes (Signed)
Office Visit Note   Patient: Susan [name]           Date of Birth: [birth year]           MRN: [id number] Visit Date: 03/03/2022              Requested by: [name], MD [address] [place],  [place] [address] PCP: [name], MD  Chief Complaint  Patient presents with   Left Foot - Follow-up    Toenail trimming      HPI: Patient is a 71 year old woman who presents with a right heel superficial ulcer as well as onychomycotic nails x5 on the left foot with weeping edema and multiple ulcers of both lower extremities.  Patient states she was recently hospitalized for 2 weeks for cholecystitis.  Patient states she was not a surgical candidate.  Assessment & Plan: Visit Diagnoses:  1. History of transmetatarsal amputation of right foot ([place])   2. 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 Onychomycosis     Plan: Both legs are wrapped with a 3 layer compression wrap.  Nails were trimmed x5.  A PRAFO was placed on the right side to unload pressure on the right heel.  Follow-Up Instructions: Return in about 1 week (around 03/10/2022).   Ortho Exam  Patient is alert, oriented, no adenopathy, well-dressed, normal affect, normal respiratory effort. Examination patient has increased swelling and weeping edema with multiple ulcers on both lower extremities.  Her calves are both 52 cm in circumference.  Patient has thickened discolored onychomycotic nails x5 on the left foot and the nails were trimmed x5 without complications.  Patient has a superficial ulcer on the right heel due to her recent immobilization.  This is a 4 cm in diameter and involves skin only.  Hemoglobin A1c is consistently greater than 15.5.  Imaging: No results found. No images are attached to the encounter.  Labs: Lab Results  Component Value Date   HGBA1C >15.5 (H) 01/20/2022   HGBA1C >15.5 (H) 01/19/2022   HGBA1C >15.5 (H)  01/14/2022   CRP 0.7 08/22/2020   CRP 1.5 (H) 03/13/2020   CRP 1.9 (H) 03/12/2020   LABURIC 4.7 08/09/2020   LABURIC 9.1 (H) 10/12/2018   LABURIC 9.0 (H) 03/06/2018   REPTSTATUS 02/08/2022 FINAL 02/03/2022   CULT  02/03/2022    NO GROWTH 5 DAYS Performed at [facility], [place] [address] [place],  [address]    [facility] ENTEROCOCCUS FAECIUM 02/01/2022   LABORGA ENTEROCOCCUS FAECALIS 02/01/2022     Lab Results  Component Value Date   ALBUMIN 2.8 (L) 02/11/2022   ALBUMIN 2.6 (L) 02/10/2022   ALBUMIN 2.3 (L) 02/04/2022    Lab Results  Component Value Date   MG 1.8 02/11/2022   MG 1.9 02/10/2022   MG 2.2 02/07/2022   No results found for: "VD25OH"  No results found for: "PREALBUMIN"    Latest Ref Rng & Units 02/18/2022   12:28 PM 02/17/2022    2:17 PM 02/11/2022    6:40 AM  CBC EXTENDED  WBC 3.8 - 10.8 Thousand/uL 6.6  7.1  12.0   RBC 3.80 - 5.10 Million/uL 3.27  3.49  3.86   Hemoglobin 11.7 - 15.5 g/dL 9.0  9.4  10.5   HCT 35.0 - 45.0 % 28.2  30.2  32.6   Platelets 140 - 400 Thousand/uL 179  217  207   NEUT#  1,500 - 7,800 cells/uL 4,996  5,332    Lymph# 850 - 3,900 cells/uL 554  710       There is no height or weight on file to calculate BMI.  Orders:  No orders of the defined types were placed in this encounter.  No orders of the defined types were placed in this encounter.    Procedures: No procedures performed  Clinical Data: No additional findings.  ROS:  All other systems negative, except as noted in the HPI. Review of Systems  Objective: Vital Signs: There were no vitals taken for this visit.  Specialty Comments:  No specialty comments available.  PMFS History: Patient Active Problem List   Diagnosis Date Noted   Sepsis without acute organ dysfunction ([place])    Bacteremia    Acute pancreatitis 02/01/2022   Abdominal pain 02/01/2022   SIRS (systemic inflammatory response syndrome) ([place]) 02/01/2022   Transaminitis 02/01/2022    History of anemia due to chronic kidney disease 02/01/2022   Paroxysmal atrial fibrillation ([place]) 02/01/2022   DKA (diabetic ketoacidosis) ([place]) 01/14/2022   NSTEMI (non-ST elevated myocardial infarction) ([place]) 03/05/2021   Acute renal failure superimposed on stage 4 chronic kidney disease ([place]) 08/22/2020   Hypoalbuminemia 05/25/2020   GERD (gastroesophageal reflux disease) 05/25/2020   Pressure injury of skin 05/17/2020   Type 2 diabetes mellitus with diabetic polyneuropathy, with long-term current use of insulin ([place]) 03/07/2020   Obesity, Class III, BMI 40-49.9 (morbid obesity) ([place]) 03/07/2020   Common bile duct (CBD) obstruction 05/28/2019   Benign neoplasm of ascending colon    Benign neoplasm of transverse colon    Benign neoplasm of descending colon    Benign neoplasm of sigmoid colon    Gastric polyps    Hyperkalemia 03/11/2019   Prolonged QT interval 03/11/2019   Onychomycosis 06/21/2018   Osteomyelitis of second toe of right foot ([place])    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 (HCC)    PVD (peripheral vascular disease) ([place]) 10/26/2017   E-coli UTI 07/27/2017   Hypothyroidism 07/27/2017   PAH (pulmonary artery hypertension) (HCC)    Impaired ambulation 07/19/2017   Nausea & vomiting 07/15/2017   Leg cramps 02/27/2017   Peripheral edema 01/12/2017   Diabetic neuropathy ([place]) 11/12/2016   CKD (chronic kidney disease), stage IV ([place]) 10/24/2015   Anemia 10/03/2015   Generalized anxiety disorder 10/03/2015   Insomnia 10/03/2015   Hyperglycemia due to diabetes mellitus ([place]) 06/07/2015   Chronic diastolic CHF (congestive heart failure) ([place]) 06/07/2015   Non compliance with medical treatment 04/17/2014   Rotator cuff tear 03/14/2014   Class 3 obesity ([place]) 09/23/2013   Chronic HFrEF (heart failure with reduced ejection fraction) ([place]) 06/03/2013   Hypotension 12/25/2012   Urinary incontinence    MDD (major depressive disorder) 11/12/2010   RBBB  (right bundle branch block)    Coronary artery disease    Hyperlipemia 01/22/2009   Essential hypertension 01/22/2009   Past Medical History:  Diagnosis Date   Acute MI ([place]) 1999; 2007   Anemia    hx   Anginal pain ([place])    Anxiety    ARF (acute renal failure) ([place]) 06/2017   Rodney Kidney Asso   Arthritis    "generalized" (03/15/2014)   CAD (coronary artery disease)    MI in 2000 - MI  2007 - treated bare metal stent (no nuclear since then as 9/11)   Carotid artery disease ([place])    CHF (congestive heart failure) ([place])  Chronic diastolic heart failure (HCC)    a) ECHO (08/2013) EF 55-60% and RV function nl b) RHC (08/2013) RA 4, RV 30/5/7, PA 25/10 (16), PCWP 7, Fick CO/CI 6.3/2.7, PVR 1.5 WU, PA 61 and 66%   Daily headache    "~ every other day; since I fell in June" (03/15/2014)   Depression    Dyslipidemia    Dyspnea    Exertional shortness of breath    HTN (hypertension)    Hypothyroidism    Neuropathy    Obesity    Osteoarthritis    Peripheral neuropathy    PONV (postoperative nausea and vomiting)    RBBB (right bundle branch block)    Old   Stroke ([place])    mini strokes   Syncope    likely due to low blood sugar   Tachycardia    Sinus tachycardia   Type II diabetes mellitus (HCC)    Type II   Urinary incontinence    Venous insufficiency     Family History  Problem Relation Age of Onset   Heart attack Mother 75    Past Surgical History:  Procedure Laterality Date   ABDOMINAL HYSTERECTOMY  1980's   AMPUTATION Right 02/24/2018   Procedure: RIGHT FOOT GREAT TOE AND 2ND TOE AMPUTATION;  Surgeon: Newt Minion, MD;  Location: [place];  Service: Orthopedics;  Laterality: Right;   AMPUTATION Right 04/30/2018   Procedure: RIGHT TRANSMETATARSAL AMPUTATION;  Surgeon: Newt Minion, MD;  Location: [place];  Service: Orthopedics;  Laterality: Right;   BIOPSY  05/27/2020   Procedure: BIOPSY;  Surgeon: [name], DO;  [facility];  Service:  Endoscopy;;   CATARACT EXTRACTION, BILATERAL Bilateral ?2013   COLONOSCOPY W/ POLYPECTOMY     COLONOSCOPY WITH PROPOFOL N/A 03/13/2019   Procedure: COLONOSCOPY WITH PROPOFOL;  Surgeon: Jerene Bears, MD;  Location: [place];  Service: Gastroenterology;  Laterality: N/A;   CORONARY ANGIOPLASTY WITH STENT PLACEMENT  1999; 2007   "1 + 1"   ERCP N/A 02/03/2022   Procedure: ENDOSCOPIC RETROGRADE CHOLANGIOPANCREATOGRAPHY (ERCP);  Surgeon: Carol Ada, MD;  Location: [place];  Service: Gastroenterology;  Laterality: N/A;   ESOPHAGOGASTRODUODENOSCOPY (EGD) WITH PROPOFOL N/A 03/13/2019   Procedure: ESOPHAGOGASTRODUODENOSCOPY (EGD) WITH PROPOFOL;  Surgeon: Jerene Bears, MD;  Location: [facility];  Service: Gastroenterology;  Laterality: N/A;   ESOPHAGOGASTRODUODENOSCOPY (EGD) WITH PROPOFOL N/A 05/27/2020   Procedure: ESOPHAGOGASTRODUODENOSCOPY (EGD) WITH PROPOFOL;  Surgeon: [name], DO;  [facility];  Service: Endoscopy;  Laterality: N/A;   EYE SURGERY Bilateral    [place]   HEMOSTASIS CLIP PLACEMENT  03/13/2019   Procedure: HEMOSTASIS CLIP PLACEMENT;  Surgeon: Jerene Bears, MD;  Location: [facility] ENDOSCOPY;  Service: Gastroenterology;;   KNEE ARTHROSCOPY Left 10/25/2006   POLYPECTOMY  03/13/2019   Procedure: POLYPECTOMY;  Surgeon: Jerene Bears, MD;  Location: [facility] ENDOSCOPY;  Service: Gastroenterology;;   REMOVAL OF STONES  02/03/2022   Procedure: REMOVAL OF STONES;  Surgeon: Carol Ada, MD;  Location: [place];  Service: Gastroenterology;;   RIGHT HEART CATH N/A 07/24/2017   Procedure: RIGHT HEART CATH;  Surgeon: Jolaine Artist, MD;  Location: [place] [facility];  Service: Cardiovascular;  Laterality: N/A;   RIGHT HEART CATHETERIZATION N/A 09/22/2013   Procedure: RIGHT HEART CATH;  Surgeon: Jolaine Artist, MD;  Location: [facility] CATH LAB;  Service: Cardiovascular;  Laterality: N/A;   SHOULDER ARTHROSCOPY WITH OPEN ROTATOR CUFF REPAIR Right 03/14/2014   Procedure:  RIGHT  SHOULDER ARTHROSCOPY WITH BICEPS RELEASE, OPEN SUBSCAPULA REPAIR, OPEN SUPRASPINATUS REPAIR.;  Surgeon: Meredith Pel, MD;  Location: [place];  Service: Orthopedics;  Laterality: Right;   SPHINCTEROTOMY  02/03/2022   Procedure: SPHINCTEROTOMY;  Surgeon: Carol Ada, MD;  Location: [place];  Service: Gastroenterology;;   TEE WITHOUT CARDIOVERSION N/A 02/04/2022   Procedure: TRANSESOPHAGEAL ECHOCARDIOGRAM (TEE);  Surgeon: Jolaine Artist, MD;  Location[facility] ENDOSCOPY;  Service: Cardiovascular;  Laterality: N/A;   TOE AMPUTATION Right 02/24/2018   GREAT TOE AND 2ND TOE AMPUTATION   TUBAL LIGATION  1970's   Social History   Occupational History    Employer: UNEMPLOYED  Tobacco Use   Smoking status: Former    Packs/day: 3.00    Years: 32.00    Total pack years: 96.00    Types: Cigarettes    Quit date: 10/24/1997    Years since quitting: 24.3   Smokeless tobacco: Never  Vaping Use   Vaping Use: Never used  Substance and Sexual Activity   Alcohol use: Not Currently    Comment: "might have 2-3 daiquiris in the summer"   Drug use: No   Sexual activity: Not Currently

## 2022-03-05 ENCOUNTER — Other Ambulatory Visit: Payer: HMO

## 2022-03-05 DIAGNOSIS — N184 Chronic kidney disease, stage 4 (severe): Secondary | ICD-10-CM | POA: Diagnosis not present

## 2022-03-05 DIAGNOSIS — Z794 Long term (current) use of insulin: Secondary | ICD-10-CM | POA: Diagnosis not present

## 2022-03-05 DIAGNOSIS — E1142 Type 2 diabetes mellitus with diabetic polyneuropathy: Secondary | ICD-10-CM

## 2022-03-06 ENCOUNTER — Encounter: Payer: Self-pay | Admitting: Internal Medicine

## 2022-03-06 ENCOUNTER — Ambulatory Visit (INDEPENDENT_AMBULATORY_CARE_PROVIDER_SITE_OTHER): Payer: HMO | Admitting: Internal Medicine

## 2022-03-06 ENCOUNTER — Telehealth: Payer: Self-pay | Admitting: Orthopedic Surgery

## 2022-03-06 ENCOUNTER — Other Ambulatory Visit: Payer: Self-pay

## 2022-03-06 ENCOUNTER — Other Ambulatory Visit (HOSPITAL_COMMUNITY): Payer: HMO

## 2022-03-06 ENCOUNTER — Other Ambulatory Visit (HOSPITAL_COMMUNITY): Payer: Self-pay | Admitting: Cardiology

## 2022-03-06 VITALS — BP 150/70 | HR 71 | Temp 97.4°F | Ht 66.0 in | Wt 246.0 lb

## 2022-03-06 DIAGNOSIS — A09 Infectious gastroenteritis and colitis, unspecified: Secondary | ICD-10-CM

## 2022-03-06 LAB — BASIC METABOLIC PANEL
BUN/Creatinine Ratio: 26 (calc) — ABNORMAL HIGH (ref 6–22)
BUN: 130 mg/dL — ABNORMAL HIGH (ref 7–25)
CO2: 22 mmol/L (ref 20–32)
Calcium: 7.7 mg/dL — ABNORMAL LOW (ref 8.6–10.4)
Chloride: 97 mmol/L — ABNORMAL LOW (ref 98–110)
Creat: 4.96 mg/dL — ABNORMAL HIGH (ref 0.60–1.00)
Glucose, Bld: 270 mg/dL — ABNORMAL HIGH (ref 65–99)
Potassium: 4.2 mmol/L (ref 3.5–5.3)
Sodium: 138 mmol/L (ref 135–146)

## 2022-03-06 NOTE — Telephone Encounter (Signed)
Susan [name] (PT from [place] called requesting we call his supervisor Almira with weight barring orders for physical therapy home health. Please call Almira at [phone].

## 2022-03-06 NOTE — Progress Notes (Signed)
Patient Active Problem List   Diagnosis Date Noted   Sepsis without acute organ dysfunction ([place])    Bacteremia    Acute pancreatitis 02/01/2022   Abdominal pain 02/01/2022   SIRS (systemic inflammatory response syndrome) ([place]) 02/01/2022   Transaminitis 02/01/2022   History of anemia due to chronic kidney disease 02/01/2022   Paroxysmal atrial fibrillation ([place]) 02/01/2022   DKA (diabetic ketoacidosis) ([place]) 01/14/2022   NSTEMI (non-ST elevated myocardial infarction) ([place]) 03/05/2021   Acute renal failure superimposed on stage 4 chronic kidney disease ([place]) 08/22/2020   Hypoalbuminemia 05/25/2020   GERD (gastroesophageal reflux disease) 05/25/2020   Pressure injury of skin 05/17/2020   Type 2 diabetes mellitus with diabetic polyneuropathy, with long-term current use of insulin ([place]) 03/07/2020   Obesity, Class III, BMI 40-49.9 (morbid obesity) ([place]) 03/07/2020   Common bile duct (CBD) obstruction 05/28/2019   Benign neoplasm of ascending colon    Benign neoplasm of transverse colon    Benign neoplasm of descending colon    Benign neoplasm of sigmoid colon    Gastric polyps    Hyperkalemia 03/11/2019   Prolonged QT interval 03/11/2019   Onychomycosis 06/21/2018   Osteomyelitis of second toe of right foot ([place])    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 (HCC)    PVD (peripheral vascular disease) ([place]) 10/26/2017   E-coli UTI 07/27/2017   Hypothyroidism 07/27/2017   PAH (pulmonary artery hypertension) (HCC)    Impaired ambulation 07/19/2017   Nausea & vomiting 07/15/2017   Leg cramps 02/27/2017   Peripheral edema 01/12/2017   Diabetic neuropathy ([place]) 11/12/2016   CKD (chronic kidney disease), stage IV ([place]) 10/24/2015   Anemia 10/03/2015   Generalized anxiety disorder 10/03/2015   Insomnia 10/03/2015   Hyperglycemia due to diabetes mellitus ([place]) 06/07/2015   Chronic diastolic CHF (congestive heart failure) ([place]) 06/07/2015   Non  compliance with medical treatment 04/17/2014   Rotator cuff tear 03/14/2014   Class 3 obesity ([place]) 09/23/2013   Chronic HFrEF (heart failure with reduced ejection fraction) ([place]) 06/03/2013   Hypotension 12/25/2012   Urinary incontinence    MDD (major depressive disorder) 11/12/2010   RBBB (right bundle branch block)    Coronary artery disease    Hyperlipemia 01/22/2009   Essential hypertension 01/22/2009    Patient's Medications  New Prescriptions   No medications on file  Previous Medications   ALBUTEROL (VENTOLIN HFA) 108 (90 BASE) MCG/ACT INHALER    Inhale 1-2 puffs into the lungs every 6 (six) hours as needed for wheezing or shortness of breath.   AMIODARONE (PACERONE) 200 MG TABLET    Take 1 tablet (200 mg total) by mouth daily.   APIXABAN (ELIQUIS) 5 MG TABS TABLET    Take 1 tablet (5 mg total) by mouth 2 (two) times daily.   ASPIRIN EC 81 MG TABLET    Take 1 tablet (81 mg total) by mouth daily with breakfast.   ATORVASTATIN (LIPITOR) 80 MG TABLET    TAKE 1 TABLET BY MOUTH EVERY DAY   BLOOD GLUCOSE METER KIT AND SUPPLIES    Use up to four times daily as directed.   CARVEDILOL (COREG) 6.25 MG TABLET    Take 1 tablet (6.25 mg total) by mouth 2 (two) times daily with a meal.   COLCHICINE 0.6 MG TABLET    Take 0.6 mg by mouth daily as needed (gout).   CONTINUOUS BLOOD GLUC SENSOR (FREESTYLE LIBRE 2 SENSOR) MISC  DIPHENOXYLATE-ATROPINE (LOMOTIL) 2.5-0.025 MG TABLET    Take 1 tablet by mouth 4 (four) times daily as needed for diarrhea or loose stools.   FERROUS SULFATE 325 (65 FE) MG TABLET    Take 325 mg by mouth 2 (two) times daily.   FINGERSTIX LANCETS MISC    Use as directed to check blood sugars as need up to 4 times daily   GLUCOSE BLOOD TEST STRIP    use as directed to check blood sugars up to 4 times daily   INSULIN PEN NEEDLE 32G X 4 MM MISC    Use as directed   ISOSORBIDE MONONITRATE (IMDUR) 30 MG 24 HR TABLET    Take 0.5 tablets (15 mg total) by mouth daily.    LEVOTHYROXINE (SYNTHROID, LEVOTHROID) 50 MCG TABLET    Take 1 tablet (50 mcg total) by mouth daily before breakfast.   MAGNESIUM OXIDE (MAG-OX) 400 (240 MG) MG TABLET    Take 1 tablet (400 mg total) by mouth daily.   METOLAZONE (ZAROXOLYN) 2.5 MG TABLET    Take 1 tablet (2.5 mg total) by mouth daily for 2 days, THEN 1 tablet (2.5 mg total) once a week. Every Tuesday with an additional 20 meq of potassium.   NITROGLYCERIN (NITROSTAT) 0.4 MG SL TABLET    PLACE 1 TABLET (0.4 MG TOTAL) UNDER THE TONGUE EVERY 5 (FIVE) MINUTES AS NEEDED FOR CHEST PAIN.   NUTRITIONAL SUPPLEMENTS (FRUIT & VEGETABLE DAILY PO)    Take 1 tablet by mouth daily.   NYSTATIN (MYCOSTATIN/NYSTOP) POWDER    Apply 1 Application topically 2 (two) times daily as needed for irritation.   NYSTATIN CREAM (MYCOSTATIN)    Apply 1 Application topically 2 (two) times daily.   OXYGEN    Inhale 2 L/min into the lungs at bedtime.   POLYETHYLENE GLYCOL (MIRALAX / GLYCOLAX) 17 G PACKET    Take 17 g by mouth as needed for mild constipation.   PREGABALIN (LYRICA) 150 MG CAPSULE    Take 150 mg by mouth 2 (two) times daily.   SENNA-DOCUSATE (SENOKOT-S) 8.6-50 MG TABLET    Take 1 tablet by mouth 2 (two) times daily.   TORSEMIDE (DEMADEX) 20 MG TABLET    Take 4 tablets (80 mg total) by mouth 2 (two) times daily.   URSODIOL (ACTIGALL) 500 MG TABLET    Take 500 mg by mouth 3 (three) times daily.   VORTIOXETINE HBR (TRINTELLIX) 10 MG TABS TABLET    Take 2 tablets (20 mg total) by mouth daily.  Modified Medications   No medications on file  Discontinued Medications   No medications on file    Subjective: 71 year old female with past medical history as below presents for hospital follow-up for E. Faecalis and E. Faecium Bacteremia Suspected Secondary to GI source.  TTE showed thickened/calcified aortic valve.  TEE showed no vegetation,there is small filamentous structure on the aortic side of the valve likely Lambl's excresence. - Abdominal ultrasound  showed gallstones with prominent CBD no CBD stones visualized. -GI was consulted.  MRCP showed choledocholithiasis with mild pancreatitis(2 mm stone in the dependent portion of distal common bile duct and gentle tapering of the duct at level of the ampulla).  ERCP on 9/11 showed choledocholithiasis.  Patient underwent biliary sphincterectomy and balloon extraction. Still has some biliary colic. General surgery following no plans for lap chole a this point as pt not though to have an acute infection. Per cards pt has  surgical risk for cardiac complication and CKD progressing to  ESRD may require HD.  She was initially on vancomycin which was transitioned to daptomycin to avoid further nephrotoxicity.  On discharge she was sent home with linezolid x2 weeks from negative cultures.  EOT 9/25.  Opted for p.o. therapy as patient was apprehensive that she cannot use a PICC line. 02/17/2022: Patient presents for follow-up.  She reports she has been having diarrhea lately with 5-6 loose stools per day.  She denies fevers or chills.  She reports 100% adherence to her antibiotics, she reports she is still on them. Today 02/24/22: She reports she took linezolid till she was out, for about 4 more days since last visit.  She has started vanc but has a few days left of meds. She is not sure when she started it. She reports she has some loose stool still.   Review of Systems: Review of Systems  All other systems reviewed and are negative.   Past Medical History:  Diagnosis Date   Acute MI ([place]) 1999; 2007   Anemia    hx   Anginal pain ([place])    Anxiety    ARF (acute renal failure) ([place]) 06/2017   [facility]   Arthritis    "generalized" (03/15/2014)   CAD (coronary artery disease)    MI in 2000 - MI  2007 - treated bare metal stent (no nuclear since then as 9/11)   Carotid artery disease (HCC)    CHF (congestive heart failure) ([place])    Chronic diastolic heart failure ([place])    a) ECHO (08/2013) EF  55-60% and RV function nl b) RHC (08/2013) RA 4, RV 30/5/7, PA 25/10 (16), PCWP 7, Fick CO/CI 6.3/2.7, PVR 1.5 WU, PA 61 and 66%   Daily headache    "~ every other day; since I fell in June" (03/15/2014)   Depression    Dyslipidemia    Dyspnea    Exertional shortness of breath    HTN (hypertension)    Hypothyroidism    Neuropathy    Obesity    Osteoarthritis    Peripheral neuropathy    PONV (postoperative nausea and vomiting)    RBBB (right bundle branch block)    Old   Stroke ([place])    mini strokes   Syncope    likely due to low blood sugar   Tachycardia    Sinus tachycardia   Type II diabetes mellitus (HCC)    Type II   Urinary incontinence    Venous insufficiency     Social History   Tobacco Use   Smoking status: Former    Packs/day: 3.00    Years: 32.00    Total pack years: 96.00    Types: Cigarettes    Quit date: 10/24/1997    Years since quitting: 24.3   Smokeless tobacco: Never  Vaping Use   Vaping Use: Never used  Substance Use Topics   Alcohol use: Not Currently    Comment: "might have 2-3 daiquiris in the summer"   Drug use: No    Family History  Problem Relation Age of Onset   Heart attack Mother 43    Allergies  Allergen Reactions   Keflex [Cephalexin] Diarrhea   Codeine Nausea And Vomiting and Other (See Comments)    Health Maintenance  Topic Date Due   FOOT EXAM  Never done   COVID-19 Vaccine (4 - Moderna risk series) 08/13/2020   INFLUENZA VACCINE  12/24/2021   MAMMOGRAM  08/17/2022 (Originally 11/21/2017)   DEXA SCAN  08/17/2022 (Originally  06/06/2014)   OPHTHALMOLOGY EXAM  08/22/2022 (Originally 09/24/2018)   TETANUS/TDAP  08/22/2022 (Originally 06/06/1969)   HEMOGLOBIN A1C  07/23/2022   Diabetic kidney evaluation - Urine ACR  12/05/2022   Diabetic kidney evaluation - GFR measurement  03/06/2023   COLONOSCOPY (Pts 45-87yrs Insurance coverage will need to be confirmed)  03/12/2029   Pneumonia Vaccine 27+ Years old  Completed   Hepatitis C  Screening  Completed   Zoster Vaccines- Shingrix  Completed   HPV VACCINES  Aged Out    Objective:  Vitals:   03/06/22 1430  BP: (!) 150/70  Pulse: 71  Temp: (!) 97.4 F (36.3 C)  TempSrc: Temporal  Weight: 246 lb (111.6 kg)  Height: $Remove'5\' 6"'vOKsxVA$  (1.676 m)   Body mass index is 39.71 kg/m.  Physical Exam Constitutional:      Appearance: Normal appearance.  HENT:     Head: Normocephalic and atraumatic.     Right Ear: Tympanic membrane normal.     Left Ear: Tympanic membrane normal.     Nose: Nose normal.     Mouth/Throat:     Mouth: Mucous membranes are moist.  Eyes:     Extraocular Movements: Extraocular movements intact.     Conjunctiva/sclera: Conjunctivae normal.     Pupils: Pupils are equal, round, and reactive to light.  Cardiovascular:     Rate and Rhythm: Normal rate and regular rhythm.     Heart sounds: No murmur heard.    No friction rub. No gallop.  Pulmonary:     Effort: Pulmonary effort is normal.     Breath sounds: Normal breath sounds.  Abdominal:     General: Abdomen is flat.     Palpations: Abdomen is soft.  Musculoskeletal:        General: Normal range of motion.  Skin:    General: Skin is warm and dry.  Neurological:     General: No focal deficit present.     Mental Status: She is alert and oriented to person, place, and time.  Psychiatric:        Mood and Affect: Mood normal.     Lab Results Lab Results  Component Value Date   WBC 6.6 02/18/2022   HGB 9.0 (L) 02/18/2022   HCT 28.2 (L) 02/18/2022   MCV 86.2 02/18/2022   PLT 179 02/18/2022    Lab Results  Component Value Date   CREATININE 4.96 (H) 03/05/2022   BUN 130 (H) 03/05/2022   NA 138 03/05/2022   K 4.2 03/05/2022   CL 97 (L) 03/05/2022   CO2 22 03/05/2022    Lab Results  Component Value Date   ALT 16 02/17/2022   AST 11 02/17/2022   GGT 144 (H) 02/01/2022   ALKPHOS 255 (H) 02/04/2022   BILITOT 0.5 02/17/2022    Lab Results  Component Value Date   CHOL 92 02/01/2022    HDL 34 (L) 02/01/2022   LDLCALC 29 02/01/2022   TRIG 143 02/01/2022   CHOLHDL 2.7 02/01/2022   No results found for: "LABRPR", "RPRTITER" No results found for: "HIV1RNAQUANT", "HIV1RNAVL", "CD4TABS"   A/P #C diff diarrhea -Stool are ore formed, pt reports going 3-4 times -Pt continued linezolid for another four days after last visit. She thought she was supposed to finish all her medicine.  -she is not sure how far in the course of vancoycin she is -F/U on Monday to monitor diarrhea symptoms and pt counseled to bring in her meds.    #Enterococcus faecalis and E  faecium bacteremia suspect 2/2 GI source #Choledocholithiasis with mild pancreatitis -Completed antibiotics(linezolid) >2 weeks from negative cultures  [name], [facility] 03/06/2022, 2:51 PM

## 2022-03-06 NOTE — Patient Instructions (Signed)
Stop Linezolid Continue vancomycin '125mg'$  PO qid Follow-up in one week

## 2022-03-06 NOTE — Telephone Encounter (Signed)
[place] informed.

## 2022-03-06 NOTE — Telephone Encounter (Signed)
This is a CHF pt 

## 2022-03-06 NOTE — Telephone Encounter (Signed)
Pt was here on 03/03/22, bilateral profore wraps on legs and was given [name] boot for right heel ulcer. What are her restrictions for PT?

## 2022-03-07 LAB — CBC WITH DIFFERENTIAL/PLATELET
Absolute Monocytes: 585 cells/uL (ref 200–950)
Basophils Absolute: 20 cells/uL (ref 0–200)
Basophils Relative: 0.3 %
Eosinophils Absolute: 245 cells/uL (ref 15–500)
Eosinophils Relative: 3.6 %
HCT: 28.2 % — ABNORMAL LOW (ref 35.0–45.0)
Hemoglobin: 8.9 g/dL — ABNORMAL LOW (ref 11.7–15.5)
Lymphs Abs: 694 cells/uL — ABNORMAL LOW (ref 850–3900)
MCH: 27.9 pg (ref 27.0–33.0)
MCHC: 31.6 g/dL — ABNORMAL LOW (ref 32.0–36.0)
MCV: 88.4 fL (ref 80.0–100.0)
Monocytes Relative: 8.6 %
Neutro Abs: 5256 cells/uL (ref 1500–7800)
Neutrophils Relative %: 77.3 %
Platelets: 105 10*3/uL — ABNORMAL LOW (ref 140–400)
RBC: 3.19 10*6/uL — ABNORMAL LOW (ref 3.80–5.10)
RDW: 16.9 % — ABNORMAL HIGH (ref 11.0–15.0)
Total Lymphocyte: 10.2 %
WBC: 6.8 10*3/uL (ref 3.8–10.8)

## 2022-03-07 LAB — COMPLETE METABOLIC PANEL WITH GFR
AG Ratio: 1.3 (calc) (ref 1.0–2.5)
ALT: 22 U/L (ref 6–29)
AST: 14 U/L (ref 10–35)
Albumin: 3.5 g/dL — ABNORMAL LOW (ref 3.6–5.1)
Alkaline phosphatase (APISO): 161 U/L — ABNORMAL HIGH (ref 37–153)
BUN/Creatinine Ratio: 25 (calc) — ABNORMAL HIGH (ref 6–22)
BUN: 127 mg/dL — ABNORMAL HIGH (ref 7–25)
CO2: 26 mmol/L (ref 20–32)
Calcium: 8 mg/dL — ABNORMAL LOW (ref 8.6–10.4)
Chloride: 95 mmol/L — ABNORMAL LOW (ref 98–110)
Creat: 5.05 mg/dL — ABNORMAL HIGH (ref 0.60–1.00)
Globulin: 2.7 g/dL (calc) (ref 1.9–3.7)
Glucose, Bld: 352 mg/dL — ABNORMAL HIGH (ref 65–99)
Potassium: 4.1 mmol/L (ref 3.5–5.3)
Sodium: 137 mmol/L (ref 135–146)
Total Bilirubin: 0.7 mg/dL (ref 0.2–1.2)
Total Protein: 6.2 g/dL (ref 6.1–8.1)
eGFR: 9 mL/min/{1.73_m2} — ABNORMAL LOW (ref >=60)

## 2022-03-10 ENCOUNTER — Ambulatory Visit (INDEPENDENT_AMBULATORY_CARE_PROVIDER_SITE_OTHER): Payer: HMO | Admitting: Orthopedic Surgery

## 2022-03-10 DIAGNOSIS — Z89431 Acquired absence of right foot: Secondary | ICD-10-CM | POA: Diagnosis not present

## 2022-03-10 DIAGNOSIS — L97421 Non-pressure chronic ulcer of left heel and midfoot limited to breakdown of skin: Secondary | ICD-10-CM

## 2022-03-10 DIAGNOSIS — I83029 Varicose veins of left lower extremity with ulcer of unspecified site: Secondary | ICD-10-CM | POA: Diagnosis not present

## 2022-03-10 DIAGNOSIS — L97929 Non-pressure chronic ulcer of unspecified part of left lower leg with unspecified severity: Secondary | ICD-10-CM

## 2022-03-10 DIAGNOSIS — L97919 Non-pressure chronic ulcer of unspecified part of right lower leg with unspecified severity: Secondary | ICD-10-CM | POA: Diagnosis not present

## 2022-03-10 DIAGNOSIS — I87333 Chronic venous hypertension (idiopathic) with ulcer and inflammation of bilateral lower extremity: Secondary | ICD-10-CM

## 2022-03-10 DIAGNOSIS — L97411 Non-pressure chronic ulcer of right heel and midfoot limited to breakdown of skin: Secondary | ICD-10-CM

## 2022-03-10 DIAGNOSIS — I83019 Varicose veins of right lower extremity with ulcer of unspecified site: Secondary | ICD-10-CM | POA: Diagnosis not present

## 2022-03-11 ENCOUNTER — Ambulatory Visit (INDEPENDENT_AMBULATORY_CARE_PROVIDER_SITE_OTHER): Payer: HMO | Admitting: Internal Medicine

## 2022-03-11 ENCOUNTER — Encounter: Payer: Self-pay | Admitting: Orthopedic Surgery

## 2022-03-11 ENCOUNTER — Telehealth: Payer: Self-pay

## 2022-03-11 ENCOUNTER — Other Ambulatory Visit: Payer: Self-pay

## 2022-03-11 DIAGNOSIS — A0472 Enterocolitis due to Clostridium difficile, not specified as recurrent: Secondary | ICD-10-CM | POA: Diagnosis not present

## 2022-03-11 NOTE — Progress Notes (Signed)
Office Visit Note   Patient: Susan [name]           Date of Birth: Jan 04, 1951           MRN: [id number] Visit Date: 03/10/2022              Requested by: [name], MD [name] [place],  [place] [address] PCP: [name], MD  Chief Complaint  Patient presents with   Right Leg - Follow-up   Left Leg - Follow-up      HPI: Patient is a 71 year old woman who presents in follow-up for venous ulcers both lower extremities transmetatarsal amputation on the right with a decubitus right heel ulcer.  Patient states she started developing an ulcer on the left heel.  She is not wearing her Profore boot on the right.  Assessment & Plan: Visit Diagnoses:  1. History of transmetatarsal amputation of right foot ([place])   2. Idiopathic chronic venous hypertension of both lower extremities with ulcer and inflammation ([place])   3. Non-pressure chronic ulcer of right heel and midfoot limited to breakdown of skin ([place])   4. 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 ([place])   5. Non-pressure chronic ulcer of left heel and midfoot limited to breakdown of skin ([place])     Plan: Both legs are wrapped with a Dynaflex compression wrap with Unna paste to prevent the wraps from sliding down.  Patient is provided a new PRAFO boot for the left.  Recommended probiotics due to her recent history of antibiotics.  Reviewed the importance of glucose management.  Recommended protein supplements twice a day.  Discussed risk of amputation if there is worsening of her ulcers.  Follow-Up Instructions: Return in about 1 week (around 03/17/2022).   Ortho Exam  Patient is alert, oriented, no adenopathy, well-dressed, normal affect, normal respiratory effort. Examination patient has dermatitis and ulcerations of both lower extremities with venous insufficiency and pitting edema.  The compression wrap is slid down on the left leg.  Patient has a decubitus heel ulcer on the right and has  developed a new decubitus heel ulcer on the left.  Patient's hemoglobin A1c was greater than 15.5.  Imaging: No results found. No images are attached to the encounter.  Labs: Lab Results  Component Value Date   HGBA1C >15.5 (H) 01/20/2022   HGBA1C >15.5 (H) 01/19/2022   HGBA1C >15.5 (H) 01/14/2022   CRP 0.7 08/22/2020   CRP 1.5 (H) 03/13/2020   CRP 1.9 (H) 03/12/2020   LABURIC 4.7 08/09/2020   LABURIC 9.1 (H) 10/12/2018   LABURIC 9.0 (H) 03/06/2018   REPTSTATUS 02/08/2022 FINAL 02/03/2022   CULT  02/03/2022    NO GROWTH 5 DAYS Performed at [facility], [place] [address], [place], [place] [address]    LABORGA ENTEROCOCCUS FAECIUM 02/01/2022   LABORGA ENTEROCOCCUS FAECALIS 02/01/2022     Lab Results  Component Value Date   ALBUMIN 2.8 (L) 02/11/2022   ALBUMIN 2.6 (L) 02/10/2022   ALBUMIN 2.3 (L) 02/04/2022    Lab Results  Component Value Date   MG 1.8 02/11/2022   MG 1.9 02/10/2022   MG 2.2 02/07/2022   No results found for: "VD25OH"  No results found for: "PREALBUMIN"    Latest Ref Rng & Units 03/06/2022    3:23 PM 02/18/2022   12:28 PM 02/17/2022    2:17 PM  CBC EXTENDED  WBC 3.8 - 10.8 Thousand/uL 6.8  6.6  7.1  RBC 3.80 - 5.10 Million/uL 3.19  3.27  3.49   Hemoglobin 11.7 - 15.5 g/dL 8.9  9.0  9.4   HCT 35.0 - 45.0 % 28.2  28.2  30.2   Platelets 140 - 400 Thousand/uL 105  179  217   NEUT# 1,500 - 7,800 cells/uL 5,256  4,996  5,332   Lymph# 850 - 3,900 cells/uL 694  554  710      There is no height or weight on file to calculate BMI.  Orders:  No orders of the defined types were placed in this encounter.  No orders of the defined types were placed in this encounter.    Procedures: No procedures performed  Clinical Data: No additional findings.  ROS:  All other systems negative, except as noted in the HPI. Review of Systems  Objective: Vital Signs: There were no vitals taken for this visit.  Specialty Comments:  No specialty  comments available.  PMFS History: Patient Active Problem List   Diagnosis Date Noted   Sepsis without acute organ dysfunction ([place])    Bacteremia    Acute pancreatitis 02/01/2022   Abdominal pain 02/01/2022   SIRS (systemic inflammatory response syndrome) ([place]) 02/01/2022   Transaminitis 02/01/2022   History of anemia due to chronic kidney disease 02/01/2022   Paroxysmal atrial fibrillation ([place]) 02/01/2022   DKA (diabetic ketoacidosis) ([place]) 01/14/2022   NSTEMI (non-ST elevated myocardial infarction) ([place]) 03/05/2021   Acute renal failure superimposed on stage 4 chronic kidney disease ([place]) 08/22/2020   Hypoalbuminemia 05/25/2020   GERD (gastroesophageal reflux disease) 05/25/2020   Pressure injury of skin 05/17/2020   Type 2 diabetes mellitus with diabetic polyneuropathy, with long-term current use of insulin ([place]) 03/07/2020   Obesity, Class III, BMI 40-49.9 (morbid obesity) ([place]) 03/07/2020   Common bile duct (CBD) obstruction 05/28/2019   Benign neoplasm of ascending colon    Benign neoplasm of transverse colon    Benign neoplasm of descending colon    Benign neoplasm of sigmoid colon    Gastric polyps    Hyperkalemia 03/11/2019   Prolonged QT interval 03/11/2019   Onychomycosis 06/21/2018   Osteomyelitis of second toe of right foot ([place])    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 (HCC)    PVD (peripheral vascular disease) ([place]) 10/26/2017   E-coli UTI 07/27/2017   Hypothyroidism 07/27/2017   PAH (pulmonary artery hypertension) (HCC)    Impaired ambulation 07/19/2017   Nausea & vomiting 07/15/2017   Leg cramps 02/27/2017   Peripheral edema 01/12/2017   Diabetic neuropathy ([place]) 11/12/2016   CKD (chronic kidney disease), stage IV ([place]) 10/24/2015   Anemia 10/03/2015   Generalized anxiety disorder 10/03/2015   Insomnia 10/03/2015   Hyperglycemia due to diabetes mellitus ([place]) 06/07/2015   Chronic diastolic CHF (congestive heart failure) ([place])  06/07/2015   Non compliance with medical treatment 04/17/2014   Rotator cuff tear 03/14/2014   Class 3 obesity ([place]) 09/23/2013   Chronic HFrEF (heart failure with reduced ejection fraction) ([place]) 06/03/2013   Hypotension 12/25/2012   Urinary incontinence    MDD (major depressive disorder) 11/12/2010   RBBB (right bundle branch block)    Coronary artery disease    Hyperlipemia 01/22/2009   Essential hypertension 01/22/2009   Past Medical History:  Diagnosis Date   Acute MI ([place]) 1999; 2007   Anemia    hx   Anginal pain ([place])    Anxiety    ARF (acute renal failure) ([place]) 06/2017   [facility]  Arthritis    "generalized" (03/15/2014)   CAD (coronary artery disease)    MI in 2000 - MI  2007 - treated bare metal stent (no nuclear since then as 9/11)   Carotid artery disease (HCC)    CHF (congestive heart failure) ([place])    Chronic diastolic heart failure ([place])    a) ECHO (08/2013) EF 55-60% and RV function nl b) RHC (08/2013) RA 4, RV 30/5/7, PA 25/10 (16), PCWP 7, Fick CO/CI 6.3/2.7, PVR 1.5 WU, PA 61 and 66%   Daily headache    "~ every other day; since I fell in June" (03/15/2014)   Depression    Dyslipidemia    Dyspnea    Exertional shortness of breath    HTN (hypertension)    Hypothyroidism    Neuropathy    Obesity    Osteoarthritis    Peripheral neuropathy    PONV (postoperative nausea and vomiting)    RBBB (right bundle branch block)    Old   Stroke ([place])    mini strokes   Syncope    likely due to low blood sugar   Tachycardia    Sinus tachycardia   Type II diabetes mellitus (HCC)    Type II   Urinary incontinence    Venous insufficiency     Family History  Problem Relation Age of Onset   Heart attack Mother 49    Past Surgical History:  Procedure Laterality Date   ABDOMINAL HYSTERECTOMY  1980's   AMPUTATION Right 02/24/2018   Procedure: RIGHT FOOT GREAT TOE AND 2ND TOE AMPUTATION;  Surgeon: Newt Minion, MD;  Location: [facility];  Service:  Orthopedics;  Laterality: Right;   AMPUTATION Right 04/30/2018   Procedure: RIGHT TRANSMETATARSAL AMPUTATION;  Surgeon: Newt Minion, MD;  Location: [facility];  Service: Orthopedics;  Laterality: Right;   BIOPSY  05/27/2020   Procedure: BIOPSY;  Surgeon: [name], DO;  [facility];  Service: Endoscopy;;   CATARACT EXTRACTION, BILATERAL Bilateral ?2013   COLONOSCOPY W/ POLYPECTOMY     COLONOSCOPY WITH PROPOFOL N/A 03/13/2019   Procedure: COLONOSCOPY WITH PROPOFOL;  Surgeon: Jerene Bears, MD;  Location: [facility];  Service: Gastroenterology;  Laterality: N/A;   CORONARY ANGIOPLASTY WITH STENT PLACEMENT  1999; 2007   "1 + 1"   ERCP N/A 02/03/2022   Procedure: ENDOSCOPIC RETROGRADE CHOLANGIOPANCREATOGRAPHY (ERCP);  Surgeon: Carol Ada, MD;  Location: [facility];  Service: Gastroenterology;  Laterality: N/A;   ESOPHAGOGASTRODUODENOSCOPY (EGD) WITH PROPOFOL N/A 03/13/2019   Procedure: ESOPHAGOGASTRODUODENOSCOPY (EGD) WITH PROPOFOL;  Surgeon: Jerene Bears, MD;  Location: [facility]COPY;  Service: Gastroenterology;  Laterality: N/A;   ESOPHAGOGASTRODUODENOSCOPY (EGD) WITH PROPOFOL N/A 05/27/2020   Procedure: ESOPHAGOGASTRODUODENOSCOPY (EGD) WITH PROPOFOL;  Surgeon: [name], DO;  [facility];  Service: Endoscopy;  Laterality: N/A;   EYE SURGERY Bilateral    [place]   HEMOSTASIS CLIP PLACEMENT  03/13/2019   Procedure: HEMOSTASIS CLIP PLACEMENT;  Surgeon: Jerene Bears, MD;  Location: [facility] ENDOSCOPY;  Service: Gastroenterology;;   KNEE ARTHROSCOPY Left 10/25/2006   POLYPECTOMY  03/13/2019   Procedure: POLYPECTOMY;  Surgeon: Jerene Bears, MD;  Location: [facility] ENDOSCOPY;  Service: Gastroenterology;;   REMOVAL OF STONES  02/03/2022   Procedure: REMOVAL OF STONES;  Surgeon: Carol Ada, MD;  Location: [facility];  Service: Gastroenterology;;   RIGHT HEART CATH N/A 07/24/2017   Procedure: RIGHT HEART CATH;  Surgeon: Jolaine Artist, MD;  Location: [facility];  Service: Cardiovascular;  Laterality: N/A;   RIGHT HEART CATHETERIZATION N/A 09/22/2013   Procedure: RIGHT HEART CATH;  Surgeon: Jolaine Artist, MD;  Location: [facility] CATH LAB;  Service: Cardiovascular;  Laterality: N/A;   SHOULDER ARTHROSCOPY WITH OPEN ROTATOR CUFF REPAIR Right 03/14/2014   Procedure: RIGHT SHOULDER ARTHROSCOPY WITH BICEPS RELEASE, OPEN SUBSCAPULA REPAIR, OPEN SUPRASPINATUS REPAIR.;  Surgeon: Meredith Pel, MD;  Location: [facility];  Service: Orthopedics;  Laterality: Right;   SPHINCTEROTOMY  02/03/2022   Procedure: SPHINCTEROTOMY;  Surgeon: Carol Ada, MD;  Location: [facility];  Service: Gastroenterology;;   TEE WITHOUT CARDIOVERSION N/A 02/04/2022   Procedure: TRANSESOPHAGEAL ECHOCARDIOGRAM (TEE);  Surgeon: Jolaine Artist, MD;  Location: [facility];  Service: Cardiovascular;  Laterality: N/A;   TOE AMPUTATION Right 02/24/2018   GREAT TOE AND 2ND TOE AMPUTATION   TUBAL LIGATION  1970's   Social History   Occupational History    Employer: UNEMPLOYED  Tobacco Use   Smoking status: Former    Packs/day: 3.00    Years: 32.00    Total pack years: 96.00    Types: Cigarettes    Quit date: 10/24/1997    Years since quitting: 24.3   Smokeless tobacco: Never  Vaping Use   Vaping Use: Never used  Substance and Sexual Activity   Alcohol use: Not Currently    Comment: "might have 2-3 daiquiris in the summer"   Drug use: No   Sexual activity: Not Currently

## 2022-03-11 NOTE — Patient Instructions (Signed)
F/U with nephrology

## 2022-03-11 NOTE — Telephone Encounter (Signed)
Call from pt's [place] and pt's weight today is 253.5# Thank you.

## 2022-03-11 NOTE — Progress Notes (Signed)
Patient: Susan [name]  DOB: 10-Nov-1950 MRN: [id number] PCP: [name], MD   Chief Complaint  Patient presents with   Follow-up    Diarrhea 3 times a day/     Patient Active Problem List   Diagnosis Date Noted   Sepsis without acute organ dysfunction ([place])    Bacteremia    Acute pancreatitis 02/01/2022   Abdominal pain 02/01/2022   SIRS (systemic inflammatory response syndrome) ([place]) 02/01/2022   Transaminitis 02/01/2022   History of anemia due to chronic kidney disease 02/01/2022   Paroxysmal atrial fibrillation ([place]) 02/01/2022   DKA (diabetic ketoacidosis) ([place]) 01/14/2022   NSTEMI (non-ST elevated myocardial infarction) ([place]) 03/05/2021   Acute renal failure superimposed on stage 4 chronic kidney disease ([place]) 08/22/2020   Hypoalbuminemia 05/25/2020   GERD (gastroesophageal reflux disease) 05/25/2020   Pressure injury of skin 05/17/2020   Type 2 diabetes mellitus with diabetic polyneuropathy, with long-term current use of insulin ([place]) 03/07/2020   Obesity, Class III, BMI 40-49.9 (morbid obesity) ([place]) 03/07/2020   Common bile duct (CBD) obstruction 05/28/2019   Benign neoplasm of ascending colon    Benign neoplasm of transverse colon    Benign neoplasm of descending colon    Benign neoplasm of sigmoid colon    Gastric polyps    Hyperkalemia 03/11/2019   Prolonged QT interval 03/11/2019   Onychomycosis 06/21/2018   Osteomyelitis of second toe of right foot ([place])    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 (HCC)    PVD (peripheral vascular disease) ([place]) 10/26/2017   E-coli UTI 07/27/2017   Hypothyroidism 07/27/2017   PAH (pulmonary artery hypertension) (HCC)    Impaired ambulation 07/19/2017   Nausea & vomiting 07/15/2017   Leg cramps 02/27/2017   Peripheral edema 01/12/2017   Diabetic neuropathy ([place]) 11/12/2016   CKD (chronic kidney disease), stage IV ([place]) 10/24/2015   Anemia 10/03/2015   Generalized anxiety disorder 10/03/2015    Insomnia 10/03/2015   Hyperglycemia due to diabetes mellitus ([place]) 06/07/2015   Chronic diastolic CHF (congestive heart failure) ([place]) 06/07/2015   Non compliance with medical treatment 04/17/2014   Rotator cuff tear 03/14/2014   Class 3 obesity ([place]) 09/23/2013   Chronic HFrEF (heart failure with reduced ejection fraction) ([place]) 06/03/2013   Hypotension 12/25/2012   Urinary incontinence    MDD (major depressive disorder) 11/12/2010   RBBB (right bundle branch block)    Coronary artery disease    Hyperlipemia 01/22/2009   Essential hypertension 01/22/2009     Subjective:  71 year old female with past medical history as below presents for hospital follow-up for E. Faecalis and E. Faecium Bacteremia Suspected Secondary to GI source.  TTE showed thickened/calcified aortic valve.  TEE showed no vegetation,there is small filamentous structure on the aortic side of the valve likely Lambl's excresence. - Abdominal ultrasound showed gallstones with prominent CBD no CBD stones visualized. -GI was consulted.  MRCP showed choledocholithiasis with mild pancreatitis(2 mm stone in the dependent portion of distal common bile duct and gentle tapering of the duct at level of the ampulla).  ERCP on 9/11 showed choledocholithiasis.  Patient underwent biliary sphincterectomy and balloon extraction. Still has some biliary colic. General surgery following no plans for lap chole a this point as pt not though to have an acute infection. Per cards pt has  surgical risk for cardiac complication and CKD progressing to ESRD may require HD.  She was initially on vancomycin which was transitioned to daptomycin to avoid further nephrotoxicity.  On discharge she was sent home with linezolid x2 weeks from negative cultures.  EOT 9/25.  Opted for p.o. therapy as patient was apprehensive that she cannot use a PICC line. 02/17/2022: Patient presents for follow-up.  She reports she has been having diarrhea lately with 5-6 loose  stools per day.  She denies fevers or chills.  She reports 100% adherence to her antibiotics, she reports she is still on them.  02/24/22: She reports she took linezolid till she was out, for about 4 more days since last visit.  She has started vanc but has a few days left of meds. She is not sure when she started it. She reports she has some loose stool still.   Today 03/11/22: Pt reports her farm have become formed.She completed vancomycin on Sunday. She brought her meds with her. Review of Systems  All other systems reviewed and are negative.   Past Medical History:  Diagnosis Date   Acute MI ([place]) 1999; 2007   Anemia    hx   Anginal pain ([place])    Anxiety    ARF (acute renal failure) ([place]) 06/2017   [facility]   Arthritis    "generalized" (03/15/2014)   CAD (coronary artery disease)    MI in 2000 - MI  2007 - treated bare metal stent (no nuclear since then as 9/11)   Carotid artery disease (HCC)    CHF (congestive heart failure) ([place])    Chronic diastolic heart failure ([place])    a) ECHO (08/2013) EF 55-60% and RV function nl b) RHC (08/2013) RA 4, RV 30/5/7, PA 25/10 (16), PCWP 7, Fick CO/CI 6.3/2.7, PVR 1.5 WU, PA 61 and 66%   Daily headache    "~ every other day; since I fell in June" (03/15/2014)   Depression    Dyslipidemia    Dyspnea    Exertional shortness of breath    HTN (hypertension)    Hypothyroidism    Neuropathy    Obesity    Osteoarthritis    Peripheral neuropathy    PONV (postoperative nausea and vomiting)    RBBB (right bundle branch block)    Old   Stroke ([place])    mini strokes   Syncope    likely due to low blood sugar   Tachycardia    Sinus tachycardia   Type II diabetes mellitus (HCC)    Type II   Urinary incontinence    Venous insufficiency     Outpatient Medications Prior to Visit  Medication Sig Dispense Refill   albuterol (VENTOLIN HFA) 108 (90 Base) MCG/ACT inhaler Inhale 1-2 puffs into the lungs every 6 (six) hours as needed for  wheezing or shortness of breath.     amiodarone (PACERONE) 200 MG tablet Take 1 tablet (200 mg total) by mouth daily. 90 tablet 3   apixaban (ELIQUIS) 5 MG TABS tablet Take 1 tablet (5 mg total) by mouth 2 (two) times daily. 180 tablet 3   aspirin EC 81 MG tablet Take 1 tablet (81 mg total) by mouth daily with breakfast. 30 tablet 11   atorvastatin (LIPITOR) 80 MG tablet TAKE 1 TABLET BY MOUTH EVERY DAY 90 tablet 1   blood glucose meter kit and supplies Use up to four times daily as directed. 1 each 0   carvedilol (COREG) 6.25 MG tablet Take 1 tablet (6.25 mg total) by mouth 2 (two) times daily with a meal. 180 tablet 3   colchicine 0.6 MG tablet Take 0.6 mg by  mouth daily as needed (gout).     Continuous Blood Gluc Sensor (FREESTYLE LIBRE 2 SENSOR) MISC      diphenoxylate-atropine (LOMOTIL) 2.5-0.025 MG tablet Take 1 tablet by mouth 4 (four) times daily as needed for diarrhea or loose stools. 30 tablet 0   ferrous sulfate 325 (65 FE) MG tablet Take 325 mg by mouth 2 (two) times daily.     Fingerstix Lancets MISC Use as directed to check blood sugars as need up to 4 times daily 100 each 0   glucose blood test strip use as directed to check blood sugars up to 4 times daily 100 each 0   Insulin Pen Needle 32G X 4 MM MISC Use as directed 200 each 0   isosorbide mononitrate (IMDUR) 30 MG 24 hr tablet Take 0.5 tablets (15 mg total) by mouth daily. 45 tablet 3   levothyroxine (SYNTHROID, LEVOTHROID) 50 MCG tablet Take 1 tablet (50 mcg total) by mouth daily before breakfast. 90 tablet 1   magnesium oxide (MAG-OX) 400 (240 Mg) MG tablet Take 1 tablet (400 mg total) by mouth daily. 90 tablet 3   metolazone (ZAROXOLYN) 2.5 MG tablet Take 1 tablet (2.5 mg total) by mouth daily for 2 days, THEN 1 tablet (2.5 mg total) once a week. Every Tuesday with an additional 20 meq of potassium. 6 tablet 3   nitroGLYCERIN (NITROSTAT) 0.4 MG SL tablet PLACE 1 TABLET (0.4 MG TOTAL) UNDER THE TONGUE EVERY 5 (FIVE) MINUTES AS  NEEDED FOR CHEST PAIN. 25 tablet 1   Nutritional Supplements (FRUIT & VEGETABLE DAILY PO) Take 1 tablet by mouth daily.     nystatin (MYCOSTATIN/NYSTOP) powder Apply 1 Application topically 2 (two) times daily as needed for irritation.     nystatin cream (MYCOSTATIN) Apply 1 Application topically 2 (two) times daily.     OXYGEN Inhale 2 L/min into the lungs at bedtime.     polyethylene glycol (MIRALAX / GLYCOLAX) 17 g packet Take 17 g by mouth as needed for mild constipation.     pregabalin (LYRICA) 150 MG capsule Take 150 mg by mouth 2 (two) times daily.     senna-docusate (SENOKOT-S) 8.6-50 MG tablet Take 1 tablet by mouth 2 (two) times daily. (Patient taking differently: Take 1 tablet by mouth 2 (two) times daily. As needed)     torsemide (DEMADEX) 20 MG tablet Take 4 tablets (80 mg total) by mouth 2 (two) times daily. 240 tablet 3   ursodiol (ACTIGALL) 500 MG tablet Take 500 mg by mouth 3 (three) times daily.     vortioxetine HBr (TRINTELLIX) 10 MG TABS tablet Take 2 tablets (20 mg total) by mouth daily. 90 tablet 3   No facility-administered medications prior to visit.     Allergies  Allergen Reactions   Keflex [Cephalexin] Diarrhea   Codeine Nausea And Vomiting and Other (See Comments)    Social History   Tobacco Use   Smoking status: Former    Packs/day: 3.00    Years: 32.00    Total pack years: 96.00    Types: Cigarettes    Quit date: 10/24/1997    Years since quitting: 24.3   Smokeless tobacco: Never  Vaping Use   Vaping Use: Never used  Substance Use Topics   Alcohol use: Not Currently    Comment: "might have 2-3 daiquiris in the summer"   Drug use: No    Family History  Problem Relation Age of Onset   Heart attack Mother 3    Objective:  There were no vitals filed for this visit. There is no height or weight on file to calculate BMI.  Physical Exam Constitutional:      Appearance: Normal appearance.  HENT:     Head: Normocephalic and atraumatic.      Right Ear: Tympanic membrane normal.     Left Ear: Tympanic membrane normal.     Nose: Nose normal.     Mouth/Throat:     Mouth: Mucous membranes are moist.  Eyes:     Extraocular Movements: Extraocular movements intact.     Conjunctiva/sclera: Conjunctivae normal.     Pupils: Pupils are equal, round, and reactive to light.  Cardiovascular:     Rate and Rhythm: Normal rate and regular rhythm.     Heart sounds: No murmur heard.    No friction rub. No gallop.  Pulmonary:     Effort: Pulmonary effort is normal.     Breath sounds: Normal breath sounds.  Abdominal:     General: Abdomen is flat.     Palpations: Abdomen is soft.  Musculoskeletal:        General: Normal range of motion.  Skin:    General: Skin is warm and dry.  Neurological:     General: No focal deficit present.     Mental Status: She is alert and oriented to person, place, and time.  Psychiatric:        Mood and Affect: Mood normal.     Lab Results: Lab Results  Component Value Date   WBC 6.8 03/06/2022   HGB 8.9 (L) 03/06/2022   HCT 28.2 (L) 03/06/2022   MCV 88.4 03/06/2022   PLT 105 (L) 03/06/2022    Lab Results  Component Value Date   CREATININE 5.05 (H) 03/06/2022   BUN 127 (H) 03/06/2022   NA 137 03/06/2022   K 4.1 03/06/2022   CL 95 (L) 03/06/2022   CO2 26 03/06/2022    Lab Results  Component Value Date   ALT 22 03/06/2022   AST 14 03/06/2022   GGT 144 (H) 02/01/2022   ALKPHOS 255 (H) 02/04/2022   BILITOT 0.7 03/06/2022     Assessment & Plan:   #C diff diarrhea-resolved -Pt continued linezolid for another four days after 9/28 end date. She thought she was supposed to finish all her medicine.  -Completed PO vanc on Sunday. Diarrhea resolved. I reveewed the meds that pt brought in and no linezolid and vancomycin left.  -Follow-up with ID PRN     #Enterococcus faecalis and E faecium bacteremia suspect 2/2 GI source #Choledocholithiasis with mild pancreatitis -Completed  antibiotics(linezolid) >2 weeks from negative cultures   #CKD Follows with Dr. Hollie Salk (nephrology)-plans to move appoint sooner than November.   [name], MD [facility]   03/11/22  11:37 AM

## 2022-03-12 ENCOUNTER — Telehealth: Payer: Self-pay

## 2022-03-12 NOTE — Telephone Encounter (Signed)
Prior Authorization results for Trintellix 20 mg:\  Prior Authorization not required for patient/medication Drug Trintellix (formerly Brintellix) '20MG'$  tablets [employer] Electronic Prior Authorization Form 2017 NCPDP  Pt advised and copy of PA faxed to patient's pharmacy.

## 2022-03-14 ENCOUNTER — Telehealth: Payer: Self-pay

## 2022-03-14 NOTE — Telephone Encounter (Signed)
Call from [place],  pt's wt yesterday afternoon was [phone]#. Thank you.

## 2022-03-17 ENCOUNTER — Ambulatory Visit (INDEPENDENT_AMBULATORY_CARE_PROVIDER_SITE_OTHER): Payer: HMO | Admitting: Orthopedic Surgery

## 2022-03-17 ENCOUNTER — Ambulatory Visit: Payer: HMO | Admitting: Orthopedic Surgery

## 2022-03-17 ENCOUNTER — Encounter: Payer: Self-pay | Admitting: Orthopedic Surgery

## 2022-03-17 DIAGNOSIS — I87333 Chronic venous hypertension (idiopathic) with ulcer and inflammation of bilateral lower extremity: Secondary | ICD-10-CM

## 2022-03-17 DIAGNOSIS — Z89431 Acquired absence of right foot: Secondary | ICD-10-CM | POA: Diagnosis not present

## 2022-03-17 DIAGNOSIS — L97421 Non-pressure chronic ulcer of left heel and midfoot limited to breakdown of skin: Secondary | ICD-10-CM | POA: Diagnosis not present

## 2022-03-17 DIAGNOSIS — L97411 Non-pressure chronic ulcer of right heel and midfoot limited to breakdown of skin: Secondary | ICD-10-CM

## 2022-03-17 DIAGNOSIS — I890 Lymphedema, not elsewhere classified: Secondary | ICD-10-CM

## 2022-03-17 NOTE — Progress Notes (Signed)
Office Visit Note   Patient: Susan [name]           Date of Birth: 1951-05-04           MRN: [id number] Visit Date: 03/17/2022              Requested by: [name], MD [address] [place],  [place] [address] PCP: [name], MD  Chief Complaint  Patient presents with   Right Leg - Follow-up   Left Leg - Follow-up      HPI: Patient is a 71 year old woman who presents for 4 separate issues.  She has venous and lymphatic insufficiency with swelling of both legs.  She has a decubitus right heel ulcer previously placed in PRAFO's.  She has ulcerations to both lower extremities.  Assessment & Plan: Visit Diagnoses:  1. History of transmetatarsal amputation of right foot ([place])   2. Idiopathic chronic venous hypertension of both lower extremities with ulcer and inflammation ([place])   3. Non-pressure chronic ulcer of right heel and midfoot limited to breakdown of skin ([place])   4. Non-pressure chronic ulcer of left heel and midfoot limited to breakdown of skin ([place])   5. Lymphedema     Plan: Measurements were made for both legs.  We will place a referral for lymphedema pumps.  We will apply a compression wrap to both lower extremities.  We will investigate insurance authorization for Kerecis tissue graft to the venous ulcerations.  Discussed the importance of maintaining nonweightbearing on her heels and she will resume wearing her PRAFO boots bilaterally.  Follow-Up Instructions: No follow-ups on file.   Ortho Exam  Patient is alert, oriented, no adenopathy, well-dressed, normal affect, normal respiratory effort. Examination patient has venous and lymphatic insufficiency of both lower extremities.  Her right thigh is 71 cm in circumference left thigh is 75 cm in circumference.  Right knee is 61 cm in circumference left knee 55 cm in circumference.  Right calf 49 cm in circumference left calf 52 cm in circumference.  Right ankle 27 cm in circumference left ankle 26 cm  in circumference.  Examination of the right heel she has a decubitus black heel ulcer.  There is no cellulitis no drainage.  Examination of both legs she has venous insufficiency ulcers on both lower extremities.  The right legs largest ulcer is 2 cm in diameter of the left leg largest ulcer is medial to posterior and is 3 x 6 cm in circumference.  These are superficial with healthy granulation tissue no exposed bone or tendon.  Imaging: No results found. No images are attached to the encounter.  Labs: Lab Results  Component Value Date   HGBA1C >15.5 (H) 01/20/2022   HGBA1C >15.5 (H) 01/19/2022   HGBA1C >15.5 (H) 01/14/2022   CRP 0.7 08/22/2020   CRP 1.5 (H) 03/13/2020   CRP 1.9 (H) 03/12/2020   LABURIC 4.7 08/09/2020   LABURIC 9.1 (H) 10/12/2018   LABURIC 9.0 (H) 03/06/2018   REPTSTATUS 02/08/2022 FINAL 02/03/2022   CULT  02/03/2022    NO GROWTH 5 DAYS Performed at [name], [place] [address] [place],  [address]    LABORGA ENTEROCOCCUS FAECIUM 02/01/2022   LABORGA ENTEROCOCCUS FAECALIS 02/01/2022     Lab Results  Component Value Date   ALBUMIN 2.8 (L) 02/11/2022   ALBUMIN 2.6 (L) 02/10/2022   ALBUMIN 2.3 (L) 02/04/2022    Lab Results  Component Value Date   MG 1.8 02/11/2022  MG 1.9 02/10/2022   MG 2.2 02/07/2022   No results found for: "VD25OH"  No results found for: "PREALBUMIN"    Latest Ref Rng & Units 03/06/2022    3:23 PM 02/18/2022   12:28 PM 02/17/2022    2:17 PM  CBC EXTENDED  WBC 3.8 - 10.8 Thousand/uL 6.8  6.6  7.1   RBC 3.80 - 5.10 Million/uL 3.19  3.27  3.49   Hemoglobin 11.7 - 15.5 g/dL 8.9  9.0  9.4   HCT 35.0 - 45.0 % 28.2  28.2  30.2   Platelets 140 - 400 Thousand/uL 105  179  217   NEUT# 1,500 - 7,800 cells/uL 5,256  4,996  5,332   Lymph# 850 - 3,900 cells/uL 694  554  710      There is no height or weight on file to calculate BMI.  Orders:  No orders of the defined types were placed in this encounter.  No orders of the  defined types were placed in this encounter.    Procedures: No procedures performed  Clinical Data: No additional findings.  ROS:  All other systems negative, except as noted in the HPI. Review of Systems  Objective: Vital Signs: There were no vitals taken for this visit.  Specialty Comments:  No specialty comments available.  PMFS History: Patient Active Problem List   Diagnosis Date Noted   Sepsis without acute organ dysfunction ([place])    Bacteremia    Acute pancreatitis 02/01/2022   Abdominal pain 02/01/2022   SIRS (systemic inflammatory response syndrome) ([place]) 02/01/2022   Transaminitis 02/01/2022   History of anemia due to chronic kidney disease 02/01/2022   Paroxysmal atrial fibrillation ([place]) 02/01/2022   DKA (diabetic ketoacidosis) ([place]) 01/14/2022   NSTEMI (non-ST elevated myocardial infarction) ([place]) 03/05/2021   Acute renal failure superimposed on stage 4 chronic kidney disease ([place]) 08/22/2020   Hypoalbuminemia 05/25/2020   GERD (gastroesophageal reflux disease) 05/25/2020   Pressure injury of skin 05/17/2020   Type 2 diabetes mellitus with diabetic polyneuropathy, with long-term current use of insulin ([place]) 03/07/2020   Obesity, Class III, BMI 40-49.9 (morbid obesity) ([place]) 03/07/2020   Common bile duct (CBD) obstruction 05/28/2019   Benign neoplasm of ascending colon    Benign neoplasm of transverse colon    Benign neoplasm of descending colon    Benign neoplasm of sigmoid colon    Gastric polyps    Hyperkalemia 03/11/2019   Prolonged QT interval 03/11/2019   Onychomycosis 06/21/2018   Osteomyelitis of second toe of right foot ([place])    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 (HCC)    PVD (peripheral vascular disease) ([place]) 10/26/2017   E-coli UTI 07/27/2017   Hypothyroidism 07/27/2017   PAH (pulmonary artery hypertension) (HCC)    Impaired ambulation 07/19/2017   Nausea & vomiting 07/15/2017   Leg cramps 02/27/2017   Peripheral  edema 01/12/2017   Diabetic neuropathy ([place]) 11/12/2016   CKD (chronic kidney disease), stage IV ([place]) 10/24/2015   Anemia 10/03/2015   Generalized anxiety disorder 10/03/2015   Insomnia 10/03/2015   Hyperglycemia due to diabetes mellitus ([place]) 06/07/2015   Chronic diastolic CHF (congestive heart failure) ([place]) 06/07/2015   Non compliance with medical treatment 04/17/2014   Rotator cuff tear 03/14/2014   Class 3 obesity ([place]) 09/23/2013   Chronic HFrEF (heart failure with reduced ejection fraction) ([place]) 06/03/2013   Hypotension 12/25/2012   Urinary incontinence    MDD (major depressive disorder) 11/12/2010   RBBB (right bundle branch  block)    Coronary artery disease    Hyperlipemia 01/22/2009   Essential hypertension 01/22/2009   Past Medical History:  Diagnosis Date   Acute MI ([place]) 1999; 2007   Anemia    hx   Anginal pain ([place])    Anxiety    ARF (acute renal failure) ([place]) 06/2017   [facility]   Arthritis    "generalized" (03/15/2014)   CAD (coronary artery disease)    MI in 2000 - MI  2007 - treated bare metal stent (no nuclear since then as 9/11)   Carotid artery disease (HCC)    CHF (congestive heart failure) ([place])    Chronic diastolic heart failure ([place])    a) ECHO (08/2013) EF 55-60% and RV function nl b) RHC (08/2013) RA 4, RV 30/5/7, PA 25/10 (16), PCWP 7, Fick CO/CI 6.3/2.7, PVR 1.5 WU, PA 61 and 66%   Daily headache    "~ every other day; since I fell in June" (03/15/2014)   Depression    Dyslipidemia    Dyspnea    Exertional shortness of breath    HTN (hypertension)    Hypothyroidism    Neuropathy    Obesity    Osteoarthritis    Peripheral neuropathy    PONV (postoperative nausea and vomiting)    RBBB (right bundle branch block)    Old   Stroke ([place])    mini strokes   Syncope    likely due to low blood sugar   Tachycardia    Sinus tachycardia   Type II diabetes mellitus (HCC)    Type II   Urinary incontinence    Venous insufficiency      Family History  Problem Relation Age of Onset   Heart attack Mother 61    Past Surgical History:  Procedure Laterality Date   ABDOMINAL HYSTERECTOMY  1980's   AMPUTATION Right 02/24/2018   Procedure: RIGHT FOOT GREAT TOE AND 2ND TOE AMPUTATION;  Surgeon: Newt Minion, MD;  Location: [facility];  Service: Orthopedics;  Laterality: Right;   AMPUTATION Right 04/30/2018   Procedure: RIGHT TRANSMETATARSAL AMPUTATION;  Surgeon: Newt Minion, MD;  Location: [facility];  Service: Orthopedics;  Laterality: Right;   BIOPSY  05/27/2020   Procedure: BIOPSY;  Surgeon: [name], DO;  [facility];  Service: Endoscopy;;   CATARACT EXTRACTION, BILATERAL Bilateral ?2013   COLONOSCOPY W/ POLYPECTOMY     COLONOSCOPY WITH PROPOFOL N/A 03/13/2019   Procedure: COLONOSCOPY WITH PROPOFOL;  Surgeon: Jerene Bears, MD;  Location: [facility];  Service: Gastroenterology;  Laterality: N/A;   CORONARY ANGIOPLASTY WITH STENT PLACEMENT  1999; 2007   "1 + 1"   ERCP N/A 02/03/2022   Procedure: ENDOSCOPIC RETROGRADE CHOLANGIOPANCREATOGRAPHY (ERCP);  Surgeon: Carol Ada, MD;  Location: [facility];  Service: Gastroenterology;  Laterality: N/A;   ESOPHAGOGASTRODUODENOSCOPY (EGD) WITH PROPOFOL N/A 03/13/2019   Procedure: ESOPHAGOGASTRODUODENOSCOPY (EGD) WITH PROPOFOL;  Surgeon: Jerene Bears, MD;  Location[facility]OSCOPY;  Service: Gastroenterology;  Laterality: N/A;   ESOPHAGOGASTRODUODENOSCOPY (EGD) WITH PROPOFOL N/A 05/27/2020   Procedure: ESOPHAGOGASTRODUODENOSCOPY (EGD) WITH PROPOFOL;  Surgeon: [name], DO;  [facility];  Service: Endoscopy;  Laterality: N/A;   EYE SURGERY Bilateral    lazer   HEMOSTASIS CLIP PLACEMENT  03/13/2019   Procedure: HEMOSTASIS CLIP PLACEMENT;  Surgeon: Jerene Bears, MD;  Location[facility] ENDOSCOPY;  Service: Gastroenterology;;   KNEE ARTHROSCOPY Left 10/25/2006   POLYPECTOMY  03/13/2019   Procedure: POLYPECTOMY;  Surgeon: Jerene Bears, MD;  Location: [facility]  ENDOSCOPY;  Service: Gastroenterology;;   REMOVAL OF STONES  02/03/2022   Procedure: REMOVAL OF STONES;  Surgeon: Carol Ada, MD;  Location: [facility];  Service: Gastroenterology;;   RIGHT HEART CATH N/A 07/24/2017   Procedure: RIGHT HEART CATH;  Surgeon: Jolaine Artist, MD;  Location: [facility];  Service: Cardiovascular;  Laterality: N/A;   RIGHT HEART CATHETERIZATION N/A 09/22/2013   Procedure: RIGHT HEART CATH;  Surgeon: Jolaine Artist, MD;  Location[facility] LAB;  Service: Cardiovascular;  Laterality: N/A;   SHOULDER ARTHROSCOPY WITH OPEN ROTATOR CUFF REPAIR Right 03/14/2014   Procedure: RIGHT SHOULDER ARTHROSCOPY WITH BICEPS RELEASE, OPEN SUBSCAPULA REPAIR, OPEN SUPRASPINATUS REPAIR.;  Surgeon: Meredith Pel, MD;  Location: [facility];  Service: Orthopedics;  Laterality: Right;   SPHINCTEROTOMY  02/03/2022   Procedure: SPHINCTEROTOMY;  Surgeon: Carol Ada, MD;  Location: [facility];  Service: Gastroenterology;;   TEE WITHOUT CARDIOVERSION N/A 02/04/2022   Procedure: TRANSESOPHAGEAL ECHOCARDIOGRAM (TEE);  Surgeon: Jolaine Artist, MD;  Location: [facility] ENDOSCOPY;  Service: Cardiovascular;  Laterality: N/A;   TOE AMPUTATION Right 02/24/2018   GREAT TOE AND 2ND TOE AMPUTATION   TUBAL LIGATION  1970's   Social History   Occupational History    Employer: UNEMPLOYED  Tobacco Use   Smoking status: Former    Packs/day: 3.00    Years: 32.00    Total pack years: 96.00    Types: Cigarettes    Quit date: 10/24/1997    Years since quitting: 24.4   Smokeless tobacco: Never  Vaping Use   Vaping Use: Never used  Substance and Sexual Activity   Alcohol use: Not Currently    Comment: "might have 2-3 daiquiris in the summer"   Drug use: No   Sexual activity: Not Currently

## 2022-03-19 ENCOUNTER — Other Ambulatory Visit: Payer: Self-pay | Admitting: Family Medicine

## 2022-03-19 NOTE — Telephone Encounter (Signed)
Unable to refill per protocol, Rx expired. Medication was discontinued 01/23/22. Will refuse request.  Requested Prescriptions  Pending Prescriptions Disp Refills  . carvedilol (COREG) 12.5 MG tablet [Pharmacy Med Name: CARVEDILOL 12.5 MG TABLET] 180 tablet 1    Sig: TAKE 1 TABLET (12.'5MG'$  TOTAL) BY MOUTH TWICE A DAY WITH A MEAL     Cardiovascular: Beta Blockers 3 Failed - 03/19/2022  1:27 AM      Failed - Cr in normal range and within 360 days    Creat  Date Value Ref Range Status  03/06/2022 5.05 (H) 0.60 - 1.00 mg/dL Final    Comment:    Verified by repeat analysis. .    Creatinine, Urine  Date Value Ref Range Status  08/14/2020 35.12 mg/dL Final    Comment:    Performed at [facility], [place] [address] [place], [place] [address]         Failed - Last BP in normal range    BP Readings from Last 1 Encounters:  03/06/22 (!) 150/70         Failed - Valid encounter within last 6 months    Recent Outpatient Visits          6 months ago Cellulitis of lower extremity, unspecified laterality   [place] Dennard Schaumann, [name], MD   7 months ago Diarrhea, unspecified type   [place] Dennard Schaumann, [name], MD   7 months ago [place] [name], MD   7 months ago Stage 4 chronic kidney disease ([place])   [place] [name], MD   8 months ago Chronic diastolic CHF (congestive heart failure) ([place])   [place] [name], MD      Future Appointments            In 5 days Newt Minion, MD [facility]   In 6 days Newt Minion, MD [facility]   In 1 week Rosiland Oz, MD [facility]           Passed - AST in normal range and within 360 days    AST  Date Value Ref Range Status  03/06/2022 14 10 - 35 U/L Final         Passed - ALT in normal range and within 360 days     ALT  Date Value Ref Range Status  03/06/2022 22 6 - 29 U/L Final         Passed - Last Heart Rate in normal range    Pulse Readings from Last 1 Encounters:  03/06/22 71

## 2022-03-21 ENCOUNTER — Telehealth: Payer: Self-pay

## 2022-03-21 ENCOUNTER — Other Ambulatory Visit: Payer: Self-pay | Admitting: Family Medicine

## 2022-03-21 MED ORDER — NITROFURANTOIN MONOHYD MACRO 100 MG PO CAPS: 100.0000 mg | ORAL_CAPSULE | Freq: Two times a day (BID) | ORAL | 0 refills | Status: DC

## 2022-03-21 NOTE — Telephone Encounter (Signed)
POSSIBLE UTI: Call from [place] with [facility], pt's wt today is 249.6# Pt also c/o urinary frequency and pain with diarrhea x 3 days. Can an antibiotic be sent in for patient? Thank you.

## 2022-03-24 ENCOUNTER — Encounter: Payer: Self-pay | Admitting: Internal Medicine

## 2022-03-24 ENCOUNTER — Ambulatory Visit (INDEPENDENT_AMBULATORY_CARE_PROVIDER_SITE_OTHER): Payer: HMO | Admitting: Orthopedic Surgery

## 2022-03-24 ENCOUNTER — Encounter: Payer: Self-pay | Admitting: Orthopedic Surgery

## 2022-03-24 DIAGNOSIS — L97919 Non-pressure chronic ulcer of unspecified part of right lower leg with unspecified severity: Secondary | ICD-10-CM

## 2022-03-24 DIAGNOSIS — I83029 Varicose veins of left lower extremity with ulcer of unspecified site: Secondary | ICD-10-CM

## 2022-03-24 DIAGNOSIS — I83019 Varicose veins of right lower extremity with ulcer of unspecified site: Secondary | ICD-10-CM

## 2022-03-24 DIAGNOSIS — L97929 Non-pressure chronic ulcer of unspecified part of left lower leg with unspecified severity: Secondary | ICD-10-CM

## 2022-03-24 NOTE — Progress Notes (Unsigned)
Office Visit Note   Patient: Susan [name]           Date of Birth: 03/09/1951           MRN: [id number] Visit Date: 03/24/2022              Requested by: [name], MD [address] [place],  [place] [address] PCP: [name], MD  Chief Complaint  Patient presents with   Left Leg - Follow-up   Right Leg - Follow-up      HPI: Patient is a 71 year old woman who presents for evaluation for chronic venous insufficiency ulcers.  Patient has failed conservative treatment for wound healing with compression wraps greater than 4 weeks.  Assessment & Plan: Visit Diagnoses:  1. 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  Compression wraps were applied bilaterally.  Follow-up in 1 week for evaluation for her wounds.  Follow-Up Instructions: Return in about 1 week (around 03/31/2022).   Ortho Exam  Patient is alert, oriented, no adenopathy, well-dressed, normal affect, normal respiratory effort. Examination patient has good pulses bilaterally she has venous swelling of both lower extremities with chronic venous ulcers bilaterally.  The wound healing has stalled, the wound bed has healthy granulation tissue, and patient presents for evaluation and application of Burney Micro graft.  After informed consent a 10 blade knife was used to debride the skin and soft tissue to healthy viable bleeding granulation tissue.  Silver nitrate was used for hemostasis. The wound measures: 9 cm in length, 2cm  in width, 1 mm in depth, wound location anterior medial left leg. Kerecis MariGen micro tissue graft 19 cm2 was applied, and there was no wastage.  Please see the photo below of the Lot number and expiration date. The micro tissue graft was covered with a nonadherent Adaptic dressing, bolstered with 4 x 4 gauze and secured with a compression wrap.     Imaging: No  results found.         Labs: Lab Results  Component Value Date   HGBA1C >15.5 (H) 01/20/2022   HGBA1C >15.5 (H) 01/19/2022   HGBA1C >15.5 (H) 01/14/2022   CRP 0.7 08/22/2020   CRP 1.5 (H) 03/13/2020   CRP 1.9 (H) 03/12/2020   LABURIC 4.7 08/09/2020   LABURIC 9.1 (H) 10/12/2018   LABURIC 9.0 (H) 03/06/2018   REPTSTATUS 02/08/2022 FINAL 02/03/2022   CULT  02/03/2022    NO GROWTH 5 DAYS Performed at [facility], [place] [address] [place], [place] [address]    [facility] ENTEROCOCCUS FAECIUM 02/01/2022   LABORGA ENTEROCOCCUS FAECALIS 02/01/2022     Lab Results  Component Value Date   ALBUMIN 2.8 (L) 02/11/2022   ALBUMIN 2.6 (L) 02/10/2022   ALBUMIN 2.3 (L) 02/04/2022    Lab Results  Component Value Date   MG 1.8 02/11/2022   MG 1.9 02/10/2022   MG 2.2 02/07/2022   No results found for: "VD25OH"  No results found for: "PREALBUMIN"    Latest Ref Rng & Units 03/06/2022    3:23 PM 02/18/2022   12:28 PM 02/17/2022    2:17 PM  CBC EXTENDED  WBC 3.8 - 10.8 Thousand/uL 6.8  6.6  7.1   RBC 3.80 - 5.10 Million/uL 3.19  3.27  3.49   Hemoglobin 11.7 - 15.5 g/dL 8.9  9.0  9.4  HCT 35.0 - 45.0 % 28.2  28.2  30.2   Platelets 140 - 400 Thousand/uL 105  179  217   NEUT# 1,500 - 7,800 cells/uL 5,256  4,996  5,332   Lymph# 850 - 3,900 cells/uL 694  554  710      There is no height or weight on file to calculate BMI.  Orders:  No orders of the defined types were placed in this encounter.  No orders of the defined types were placed in this encounter.    Procedures: No procedures performed  Clinical Data: No additional findings.  ROS:  All other systems negative, except as noted in the HPI. Review of Systems  Objective: Vital Signs: There were no vitals taken for this visit.  Specialty Comments:  No specialty comments available.  PMFS History: Patient Active Problem List   Diagnosis Date Noted   Sepsis without acute organ dysfunction ([place])     Bacteremia    Acute pancreatitis 02/01/2022   Abdominal pain 02/01/2022   SIRS (systemic inflammatory response syndrome) ([place]) 02/01/2022   Transaminitis 02/01/2022   History of anemia due to chronic kidney disease 02/01/2022   Paroxysmal atrial fibrillation ([place]) 02/01/2022   DKA (diabetic ketoacidosis) ([place]) 01/14/2022   NSTEMI (non-ST elevated myocardial infarction) ([place]) 03/05/2021   Acute renal failure superimposed on stage 4 chronic kidney disease ([place]) 08/22/2020   Hypoalbuminemia 05/25/2020   GERD (gastroesophageal reflux disease) 05/25/2020   Pressure injury of skin 05/17/2020   Type 2 diabetes mellitus with diabetic polyneuropathy, with long-term current use of insulin ([place]) 03/07/2020   Obesity, Class III, BMI 40-49.9 (morbid obesity) ([place]) 03/07/2020   Common bile duct (CBD) obstruction 05/28/2019   Benign neoplasm of ascending colon    Benign neoplasm of transverse colon    Benign neoplasm of descending colon    Benign neoplasm of sigmoid colon    Gastric polyps    Hyperkalemia 03/11/2019   Prolonged QT interval 03/11/2019   Onychomycosis 06/21/2018   Osteomyelitis of second toe of right foot ([place])    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 (HCC)    PVD (peripheral vascular disease) ([place]) 10/26/2017   E-coli UTI 07/27/2017   Hypothyroidism 07/27/2017   PAH (pulmonary artery hypertension) (HCC)    Impaired ambulation 07/19/2017   Nausea & vomiting 07/15/2017   Leg cramps 02/27/2017   Peripheral edema 01/12/2017   Diabetic neuropathy ([place]) 11/12/2016   CKD (chronic kidney disease), stage IV ([place]) 10/24/2015   Anemia 10/03/2015   Generalized anxiety disorder 10/03/2015   Insomnia 10/03/2015   Hyperglycemia due to diabetes mellitus ([place]) 06/07/2015   Chronic diastolic CHF (congestive heart failure) ([place]) 06/07/2015   Non compliance with medical treatment 04/17/2014   Rotator cuff tear 03/14/2014   Class 3 obesity ([place]) 09/23/2013   Chronic HFrEF  (heart failure with reduced ejection fraction) ([place]) 06/03/2013   Hypotension 12/25/2012   Urinary incontinence    MDD (major depressive disorder) 11/12/2010   RBBB (right bundle branch block)    Coronary artery disease    Hyperlipemia 01/22/2009   Essential hypertension 01/22/2009   Past Medical History:  Diagnosis Date   Acute MI ([place]) 1999; 2007   Anemia    hx   Anginal pain ([place])    Anxiety    ARF (acute renal failure) ([place]) 06/2017   [place] [facility] As[facility]   Arthritis    "generalized" (03/15/2014)   CAD (coronary artery disease)    MI in 2000 - MI  2007 -  treated bare metal stent (no nuclear since then as 9/11)   Carotid artery disease (HCC)    CHF (congestive heart failure) ([place])    Chronic diastolic heart failure (HCC)    a) ECHO (08/2013) EF 55-60% and RV function nl b) RHC (08/2013) RA 4, RV 30/5/7, PA 25/10 (16), PCWP 7, Fick CO/CI 6.3/2.7, PVR 1.5 WU, PA 61 and 66%   Daily headache    "~ every other day; since I fell in June" (03/15/2014)   Depression    Dyslipidemia    Dyspnea    Exertional shortness of breath    HTN (hypertension)    Hypothyroidism    Neuropathy    Obesity    Osteoarthritis    Peripheral neuropathy    PONV (postoperative nausea and vomiting)    RBBB (right bundle branch block)    Old   Stroke ([place])    mini strokes   Syncope    likely due to low blood sugar   Tachycardia    Sinus tachycardia   Type II diabetes mellitus (HCC)    Type II   Urinary incontinence    Venous insufficiency     Family History  Problem Relation Age of Onset   Heart attack Mother 29    Past Surgical History:  Procedure Laterality Date   ABDOMINAL HYSTERECTOMY  1980's   AMPUTATION Right 02/24/2018   Procedure: RIGHT FOOT GREAT TOE AND 2ND TOE AMPUTATION;  Surgeon: Newt Minion, MD;  Location: [place];  Service: Orthopedics;  Laterality: Right;   AMPUTATION Right 04/30/2018   Procedure: RIGHT TRANSMETATARSAL AMPUTATION;  Surgeon: Newt Minion, MD;  Location:  [place];  Service: Orthopedics;  Laterality: Right;   BIOPSY  05/27/2020   Procedure: BIOPSY;  Surgeon: [name], DO;  [facility];  Service: Endoscopy;;   CATARACT EXTRACTION, BILATERAL Bilateral ?2013   COLONOSCOPY W/ POLYPECTOMY     COLONOSCOPY WITH PROPOFOL N/A 03/13/2019   Procedure: COLONOSCOPY WITH PROPOFOL;  Surgeon: Jerene Bears, MD;  Location: [facility] [facility];  Service: Gastroenterology;  Laterality: N/A;   CORONARY ANGIOPLASTY WITH STENT PLACEMENT  1999; 2007   "1 + 1"   ERCP N/A 02/03/2022   Procedure: ENDOSCOPIC RETROGRADE CHOLANGIOPANCREATOGRAPHY (ERCP);  Surgeon: Carol Ada, MD;  Location: [place];  Service: Gastroenterology;  Laterality: N/A;   ESOPHAGOGASTRODUODENOSCOPY (EGD) WITH PROPOFOL N/A 03/13/2019   Procedure: ESOPHAGOGASTRODUODENOSCOPY (EGD) WITH PROPOFOL;  Surgeon: Jerene Bears, MD;  [facility];  Service: Gastroenterology;  Laterality: N/A;   ESOPHAGOGASTRODUODENOSCOPY (EGD) WITH PROPOFOL N/A 05/27/2020   Procedure: ESOPHAGOGASTRODUODENOSCOPY (EGD) WITH PROPOFOL;  Surgeon: [name], DO;  [facility];  Service: Endoscopy;  Laterality: N/A;   EYE SURGERY Bilateral    [facility]   HEMOSTASIS CLIP PLACEMENT  03/13/2019   Procedure: HEMOSTASIS CLIP PLACEMENT;  Surgeon: Jerene Bears, MD;  [facility];  Service: Gastroenterology;;   KNEE ARTHROSCOPY Left 10/25/2006   POLYPECTOMY  03/13/2019   Procedure: POLYPECTOMY;  Surgeon: Jerene Bears, MD;  Location: [facility] ENDOSCOPY;  Service: Gastroenterology;;   REMOVAL OF STONES  02/03/2022   Procedure: REMOVAL OF STONES;  Surgeon: Carol Ada, MD;  Location: [place];  Service: Gastroenterology;;   RIGHT HEART CATH N/A 07/24/2017   Procedure: RIGHT HEART CATH;  Surgeon: Jolaine Artist, MD;  Location: [place] [facility];  Service: Cardiovascular;  Laterality: N/A;   RIGHT HEART CATHETERIZATION N/A 09/22/2013   Procedure: RIGHT HEART CATH;  Surgeon: Shaune Pascal  Bensimhon,  MD;  Location: [place] [facility];  Service: Cardiovascular;  Laterality: N/A;   SHOULDER ARTHROSCOPY WITH OPEN ROTATOR CUFF REPAIR Right 03/14/2014   Procedure: RIGHT SHOULDER ARTHROSCOPY WITH BICEPS RELEASE, OPEN SUBSCAPULA REPAIR, OPEN SUPRASPINATUS REPAIR.;  Surgeon: Meredith Pel, MD;  Location: [place];  Service: Orthopedics;  Laterality: Right;   SPHINCTEROTOMY  02/03/2022   Procedure: SPHINCTEROTOMY;  Surgeon: Carol Ada, MD;  Location: [place];  Service: Gastroenterology;;   TEE WITHOUT CARDIOVERSION N/A 02/04/2022   Procedure: TRANSESOPHAGEAL ECHOCARDIOGRAM (TEE);  Surgeon: Jolaine Artist, MD;  Location: [facility] ENDOSCOPY;  Service: Cardiovascular;  Laterality: N/A;   TOE AMPUTATION Right 02/24/2018   GREAT TOE AND 2ND TOE AMPUTATION   TUBAL LIGATION  1970's   Social History   Occupational History    Employer: UNEMPLOYED  Tobacco Use   Smoking status: Former    Packs/day: 3.00    Years: 32.00    Total pack years: 96.00    Types: Cigarettes    Quit date: 10/24/1997    Years since quitting: 24.4   Smokeless tobacco: Never  Vaping Use   Vaping Use: Never used  Substance and Sexual Activity   Alcohol use: Not Currently    Comment: "might have 2-3 daiquiris in the summer"   Drug use: No   Sexual activity: Not Currently

## 2022-03-25 ENCOUNTER — Ambulatory Visit: Payer: HMO | Admitting: Orthopedic Surgery

## 2022-03-26 ENCOUNTER — Other Ambulatory Visit: Payer: Self-pay

## 2022-03-26 ENCOUNTER — Ambulatory Visit (INDEPENDENT_AMBULATORY_CARE_PROVIDER_SITE_OTHER): Payer: HMO | Admitting: Infectious Diseases

## 2022-03-26 ENCOUNTER — Encounter: Payer: Self-pay | Admitting: Infectious Diseases

## 2022-03-26 VITALS — BP 139/69 | HR 62 | Temp 97.3°F

## 2022-03-26 DIAGNOSIS — L97919 Non-pressure chronic ulcer of unspecified part of right lower leg with unspecified severity: Secondary | ICD-10-CM

## 2022-03-26 DIAGNOSIS — I83029 Varicose veins of left lower extremity with ulcer of unspecified site: Secondary | ICD-10-CM

## 2022-03-26 DIAGNOSIS — R197 Diarrhea, unspecified: Secondary | ICD-10-CM | POA: Diagnosis not present

## 2022-03-26 DIAGNOSIS — N321 Vesicointestinal fistula: Secondary | ICD-10-CM

## 2022-03-26 DIAGNOSIS — N824 Other female intestinal-genital tract fistulae: Secondary | ICD-10-CM | POA: Diagnosis not present

## 2022-03-26 DIAGNOSIS — I83019 Varicose veins of right lower extremity with ulcer of unspecified site: Secondary | ICD-10-CM | POA: Diagnosis not present

## 2022-03-26 DIAGNOSIS — L97929 Non-pressure chronic ulcer of unspecified part of left lower leg with unspecified severity: Secondary | ICD-10-CM

## 2022-03-26 NOTE — Progress Notes (Unsigned)
Patient Active Problem List   Diagnosis Date Noted   Sepsis without acute organ dysfunction ([place])    Bacteremia    Acute pancreatitis 02/01/2022   Abdominal pain 02/01/2022   SIRS (systemic inflammatory response syndrome) ([place]) 02/01/2022   Transaminitis 02/01/2022   History of anemia due to chronic kidney disease 02/01/2022   Paroxysmal atrial fibrillation ([place]) 02/01/2022   DKA (diabetic ketoacidosis) ([place]) 01/14/2022   NSTEMI (non-ST elevated myocardial infarction) ([place]) 03/05/2021   Acute renal failure superimposed on stage 4 chronic kidney disease ([place]) 08/22/2020   Hypoalbuminemia 05/25/2020   GERD (gastroesophageal reflux disease) 05/25/2020   Pressure injury of skin 05/17/2020   Type 2 diabetes mellitus with diabetic polyneuropathy, with long-term current use of insulin ([place]) 03/07/2020   Obesity, Class III, BMI 40-49.9 (morbid obesity) ([place]) 03/07/2020   Common bile duct (CBD) obstruction 05/28/2019   Benign neoplasm of ascending colon    Benign neoplasm of transverse colon    Benign neoplasm of descending colon    Benign neoplasm of sigmoid colon    Gastric polyps    Hyperkalemia 03/11/2019   Prolonged QT interval 03/11/2019   Onychomycosis 06/21/2018   Osteomyelitis of second toe of right foot ([place])    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 (HCC)    PVD (peripheral vascular disease) ([place]) 10/26/2017   E-coli UTI 07/27/2017   Hypothyroidism 07/27/2017   PAH (pulmonary artery hypertension) (HCC)    Impaired ambulation 07/19/2017   Nausea & vomiting 07/15/2017   Leg cramps 02/27/2017   Peripheral edema 01/12/2017   Diabetic neuropathy ([place]) 11/12/2016   CKD (chronic kidney disease), stage IV ([place]) 10/24/2015   Anemia 10/03/2015   Generalized anxiety disorder 10/03/2015   Insomnia 10/03/2015   Hyperglycemia due to diabetes mellitus ([place]) 06/07/2015   Chronic diastolic CHF (congestive heart failure) ([place]) 06/07/2015   Non  compliance with medical treatment 04/17/2014   Rotator cuff tear 03/14/2014   Class 3 obesity ([place]) 09/23/2013   Chronic HFrEF (heart failure with reduced ejection fraction) ([place]) 06/03/2013   Hypotension 12/25/2012   Urinary incontinence    MDD (major depressive disorder) 11/12/2010   RBBB (right bundle branch block)    Coronary artery disease    Hyperlipemia 01/22/2009   Essential hypertension 01/22/2009    Patient's Medications  New Prescriptions   No medications on file  Previous Medications   ALBUTEROL (VENTOLIN HFA) 108 (90 BASE) MCG/ACT INHALER    Inhale 1-2 puffs into the lungs every 6 (six) hours as needed for wheezing or shortness of breath.   AMIODARONE (PACERONE) 200 MG TABLET    Take 1 tablet (200 mg total) by mouth daily.   APIXABAN (ELIQUIS) 5 MG TABS TABLET    Take 1 tablet (5 mg total) by mouth 2 (two) times daily.   ASPIRIN EC 81 MG TABLET    Take 1 tablet (81 mg total) by mouth daily with breakfast.   ATORVASTATIN (LIPITOR) 80 MG TABLET    TAKE 1 TABLET BY MOUTH EVERY DAY   BLOOD GLUCOSE METER KIT AND SUPPLIES    Use up to four times daily as directed.   CARVEDILOL (COREG) 6.25 MG TABLET    Take 1 tablet (6.25 mg total) by mouth 2 (two) times daily with a meal.   COLCHICINE 0.6 MG TABLET    Take 0.6 mg by mouth daily as needed (gout).   CONTINUOUS BLOOD GLUC SENSOR (FREESTYLE LIBRE 2 SENSOR) MISC  DIPHENOXYLATE-ATROPINE (LOMOTIL) 2.5-0.025 MG TABLET    Take 1 tablet by mouth 4 (four) times daily as needed for diarrhea or loose stools.   FERROUS SULFATE 325 (65 FE) MG TABLET    Take 325 mg by mouth 2 (two) times daily.   FINGERSTIX LANCETS MISC    Use as directed to check blood sugars as need up to 4 times daily   GLUCOSE BLOOD TEST STRIP    use as directed to check blood sugars up to 4 times daily   INSULIN PEN NEEDLE 32G X 4 MM MISC    Use as directed   ISOSORBIDE MONONITRATE (IMDUR) 30 MG 24 HR TABLET    Take 0.5 tablets (15 mg total) by mouth daily.    LEVOTHYROXINE (SYNTHROID, LEVOTHROID) 50 MCG TABLET    Take 1 tablet (50 mcg total) by mouth daily before breakfast.   MAGNESIUM OXIDE (MAG-OX) 400 (240 MG) MG TABLET    Take 1 tablet (400 mg total) by mouth daily.   METOLAZONE (ZAROXOLYN) 2.5 MG TABLET    Take 1 tablet (2.5 mg total) by mouth daily for 2 days, THEN 1 tablet (2.5 mg total) once a week. Every Tuesday with an additional 20 meq of potassium.   NITROFURANTOIN, MACROCRYSTAL-MONOHYDRATE, (MACROBID) 100 MG CAPSULE    Take 1 capsule (100 mg total) by mouth 2 (two) times daily.   NITROGLYCERIN (NITROSTAT) 0.4 MG SL TABLET    PLACE 1 TABLET (0.4 MG TOTAL) UNDER THE TONGUE EVERY 5 (FIVE) MINUTES AS NEEDED FOR CHEST PAIN.   NUTRITIONAL SUPPLEMENTS (FRUIT & VEGETABLE DAILY PO)    Take 1 tablet by mouth daily.   NYSTATIN (MYCOSTATIN/NYSTOP) POWDER    Apply 1 Application topically 2 (two) times daily as needed for irritation.   NYSTATIN CREAM (MYCOSTATIN)    Apply 1 Application topically 2 (two) times daily.   OXYGEN    Inhale 2 L/min into the lungs at bedtime.   POLYETHYLENE GLYCOL (MIRALAX / GLYCOLAX) 17 G PACKET    Take 17 g by mouth as needed for mild constipation.   PREGABALIN (LYRICA) 150 MG CAPSULE    Take 150 mg by mouth 2 (two) times daily.   SENNA-DOCUSATE (SENOKOT-S) 8.6-50 MG TABLET    Take 1 tablet by mouth 2 (two) times daily.   TORSEMIDE (DEMADEX) 20 MG TABLET    Take 4 tablets (80 mg total) by mouth 2 (two) times daily.   URSODIOL (ACTIGALL) 500 MG TABLET    Take 500 mg by mouth 3 (three) times daily.   VORTIOXETINE HBR (TRINTELLIX) 10 MG TABS TABLET    Take 2 tablets (20 mg total) by mouth daily.  Modified Medications   No medications on file  Discontinued Medications   No medications on file    Subjective: Complains of diarrhea that started after a week after visit with Dr Candiss Norse. It started as 4-5 times a day which was watery in consistency but has gone down to 1 BM a day in the last few days which is " not so watery".  Denies fevers, chills and sweats.denies abdominal cramps but has some soreness. She denies nausea, vomiting but states that her frequency of diarrhea worsens with food and hence, she is taking as minimal food as she can. She has not recently taken abtx. She also tells she feels like stool is coming from her vagina as well as gas coming as well for the same duration. She is not sure about any gas or stool coming from the urethra.  She has chronic dry cough with no recent changes. Denies SOB and chest pain  Denies any increased urinary frequency or discharge or dysuria  Denies any vaginal discharge.   She is following Dr Sharol Given for her chronic LE wounds related to venous insufficiency. Pictures from last clinic visit with Dr Sharol Given seen, Wound not opened as clinic does not have supplies and expertise to wrap it back if opened.  Review of Systems: all systems reviewed with pertinent positives and negatives as listed above   Past Medical History:  Diagnosis Date   Acute MI ([place]) 1999; 2007   Anemia    hx   Anginal pain ([place])    Anxiety    ARF (acute renal failure) ([place]) 06/2017   [facility]   Arthritis    "generalized" (03/15/2014)   CAD (coronary artery disease)    MI in 2000 - MI  2007 - treated bare metal stent (no nuclear since then as 9/11)   Carotid artery disease (HCC)    CHF (congestive heart failure) ([place])    Chronic diastolic heart failure ([place])    a) ECHO (08/2013) EF 55-60% and RV function nl b) RHC (08/2013) RA 4, RV 30/5/7, PA 25/10 (16), PCWP 7, Fick CO/CI 6.3/2.7, PVR 1.5 WU, PA 61 and 66%   Daily headache    "~ every other day; since I fell in June" (03/15/2014)   Depression    Dyslipidemia    Dyspnea    Exertional shortness of breath    HTN (hypertension)    Hypothyroidism    Neuropathy    Obesity    Osteoarthritis    Peripheral neuropathy    PONV (postoperative nausea and vomiting)    RBBB (right bundle branch block)    Old   Stroke ([place])    mini strokes    Syncope    likely due to low blood sugar   Tachycardia    Sinus tachycardia   Type II diabetes mellitus ([place])    Type II   Urinary incontinence    Venous insufficiency    Past Surgical History:  Procedure Laterality Date   ABDOMINAL HYSTERECTOMY  1980's   AMPUTATION Right 02/24/2018   Procedure: RIGHT FOOT GREAT TOE AND 2ND TOE AMPUTATION;  Surgeon: Newt Minion, MD;  Location: [facility];  Service: Orthopedics;  Laterality: Right;   AMPUTATION Right 04/30/2018   Procedure: RIGHT TRANSMETATARSAL AMPUTATION;  Surgeon: Newt Minion, MD;  Location: [facility];  Service: Orthopedics;  Laterality: Right;   BIOPSY  05/27/2020   Procedure: BIOPSY;  Surgeon: [name], DO;  [facility];  Service: Endoscopy;;   CATARACT EXTRACTION, BILATERAL Bilateral ?2013   COLONOSCOPY W/ POLYPECTOMY     COLONOSCOPY WITH PROPOFOL N/A 03/13/2019   Procedure: COLONOSCOPY WITH PROPOFOL;  Surgeon: Jerene Bears, MD;  Location: [facility];  Service: Gastroenterology;  Laterality: N/A;   CORONARY ANGIOPLASTY WITH STENT PLACEMENT  1999; 2007   "1 + 1"   ERCP N/A 02/03/2022   Procedure: ENDOSCOPIC RETROGRADE CHOLANGIOPANCREATOGRAPHY (ERCP);  Surgeon: Carol Ada, MD;  Location: [facility];  Service: Gastroenterology;  Laterality: N/A;   ESOPHAGOGASTRODUODENOSCOPY (EGD) WITH PROPOFOL N/A 03/13/2019   Procedure: ESOPHAGOGASTRODUODENOSCOPY (EGD) WITH PROPOFOL;  Surgeon: Jerene Bears, MD;  Location[facility]COPY;  Service: Gastroenterology;  Laterality: N/A;   ESOPHAGOGASTRODUODENOSCOPY (EGD) WITH PROPOFOL N/A 05/27/2020   Procedure: ESOPHAGOGASTRODUODENOSCOPY (EGD) WITH PROPOFOL;  Surgeon: [name], DO;  [facility];  Service: Endoscopy;  Laterality: N/A;  EYE SURGERY Bilateral    lazer   HEMOSTASIS CLIP PLACEMENT  03/13/2019   Procedure: HEMOSTASIS CLIP PLACEMENT;  Surgeon: Jerene Bears, MD;  Location[facility];  Service: Gastroenterology;;   KNEE ARTHROSCOPY Left  10/25/2006   POLYPECTOMY  03/13/2019   Procedure: POLYPECTOMY;  Surgeon: Jerene Bears, MD;  Location[facility] END[facility];  Service: Gastroenterology;;   REMOVAL OF STONES  02/03/2022   Procedure: REMOVAL OF STONES;  Surgeon: Carol Ada, MD;  Location: [facility];  Service: Gastroenterology;;   RIGHT HEART CATH N/A 07/24/2017   Procedure: RIGHT HEART CATH;  Surgeon: Jolaine Artist, MD;  Location: [facility];  Service: Cardiovascular;  Laterality: N/A;   RIGHT HEART CATHETERIZATION N/A 09/22/2013   Procedure: RIGHT HEART CATH;  Surgeon: Jolaine Artist, MD;  Location: [facility] C[facility] LAB;  Service: Cardiovascular;  Laterality: N/A;   SHOULDER ARTHROSCOPY WITH OPEN ROTATOR CUFF REPAIR Right 03/14/2014   Procedure: RIGHT SHOULDER ARTHROSCOPY WITH BICEPS RELEASE, OPEN SUBSCAPULA REPAIR, OPEN SUPRASPINATUS REPAIR.;  Surgeon: Meredith Pel, MD;  Location: [facility];  Service: Orthopedics;  Laterality: Right;   SPHINCTEROTOMY  02/03/2022   Procedure: SPHINCTEROTOMY;  Surgeon: Carol Ada, MD;  Location: [facility];  Service: Gastroenterology;;   TEE WITHOUT CARDIOVERSION N/A 02/04/2022   Procedure: TRANSESOPHAGEAL ECHOCARDIOGRAM (TEE);  Surgeon: Jolaine Artist, MD;  Location: [facility] ENDOSCOPY;  Service: Cardiovascular;  Laterality: N/A;   TOE AMPUTATION Right 02/24/2018   GREAT TOE AND 2ND TOE AMPUTATION   TUBAL LIGATION  1970's     Social History   Tobacco Use   Smoking status: Former    Packs/day: 3.00    Years: 32.00    Total pack years: 96.00    Types: Cigarettes    Quit date: 10/24/1997    Years since quitting: 24.4   Smokeless tobacco: Never  Vaping Use   Vaping Use: Never used  Substance Use Topics   Alcohol use: Not Currently    Comment: "might have 2-3 daiquiris in the summer"   Drug use: No    Family History  Problem Relation Age of Onset   Heart attack Mother 74    Allergies  Allergen Reactions   Keflex [Cephalexin] Diarrhea   Codeine Nausea And Vomiting and  Other (See Comments)    Health Maintenance  Topic Date Due   FOOT EXAM  Never done   COVID-19 Vaccine (4 - Moderna risk series) 08/13/2020   INFLUENZA VACCINE  12/24/2021   MAMMOGRAM  08/17/2022 (Originally 11/21/2017)   DEXA SCAN  08/17/2022 (Originally 06/06/2014)   OPHTHALMOLOGY EXAM  08/22/2022 (Originally 09/24/2018)   TETANUS/TDAP  08/22/2022 (Originally 06[birth year]06/1968)   HEMOGLOBIN A1C  07/23/2022   Medicare Annual Wellness (AWV)  08/17/2022   Diabetic kidney evaluation - Urine ACR  12/05/2022   Diabetic kidney evaluation - GFR measurement  03/07/2023   COLONOSCOPY (Pts 45-64yr Insurance coverage will need to be confirmed)  03/12/2029   Pneumonia Vaccine 72 Years old  Completed   Hepatitis C Screening  Completed   Zoster Vaccines- Shingrix  Completed   HPV VACCINES  Aged Out    Objective: BP 139/69   Pulse 62   Temp (!) 97.3 F (36.3 C) (Oral)   SpO2 98%    Physical Exam Constitutional:      Appearance: Normal appearance. Obese, sitting in a wheel chair and appears comfortable  HENT:     Head: Normocephalic and atraumatic.      Mouth: Mucous membranes are moist.  Eyes:  Conjunctiva/sclera: Conjunctivae normal.     Pupils:   Cardiovascular:     Rate and Rhythm: Normal rate and regular rhythm.     Heart sounds:  Pulmonary:     Effort: Pulmonary effort is normal.     Breath sounds: Normal breath sounds.   Abdominal:     General: Non distended     Palpations: soft. Non tender, BS not exaggerated   Musculoskeletal:        General: Normal range of motion.   Skin:    General: Skin is warm and dry.     Comments:               Neurological:     General: grossly non focal     Mental Status: awake, alert and oriented to person, place, and time.   Psychiatric:        Mood and Affect: not attentive and gets blank during conversation   Lab Results Lab Results  Component Value Date   WBC 6.8 03/06/2022   HGB 8.9 (L) 03/06/2022   HCT 28.2 (L)  03/06/2022   MCV 88.4 03/06/2022   PLT 105 (L) 03/06/2022    Lab Results  Component Value Date   CREATININE 5.05 (H) 03/06/2022   BUN 127 (H) 03/06/2022   NA 137 03/06/2022   K 4.1 03/06/2022   CL 95 (L) 03/06/2022   CO2 26 03/06/2022    Lab Results  Component Value Date   ALT 22 03/06/2022   AST 14 03/06/2022   GGT 144 (H) 02/01/2022   ALKPHOS 255 (H) 02/04/2022   BILITOT 0.7 03/06/2022    Lab Results  Component Value Date   CHOL 92 02/01/2022   HDL 34 (L) 02/01/2022   LDLCALC 29 02/01/2022   TRIG 143 02/01/2022   CHOLHDL 2.7 02/01/2022   No results found for: "LABRPR", "RPRTITER" No results found for: "HIV1RNAQUANT", "HIV1RNAVL", "CD4TABS"   Assessment/Plan # Diarrhea r/o C diff  # 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 Frequency of diarrhea has decreased from watery stool 4 times a day to non watery 1 time a day. Gets worse with food intake  - CT abdomen pelvis to r/o colovaginal fistula. Referral to GYN for same - CBC , CMP and C diff ag and toxin  - I would hold off on C diff tx for now pending her C diff results since the frequency/consistency of stool improving without C diff tx. I have advised her and husband to notify immediately if any worsening of diarrhea/fevers/chills/worsening abdominal cramps. - Fu in 10 days after CT abdomen/pelvis   # Chronic venous ulcer of lower extremities  - Follows with Dr Sharol Given  I have personally spent 65  minutes involved in face-to-face and non-face-to-face activities for this patient on the day of the visit. Professional time spent includes the following activities: Preparing to see the patient (review of tests), Obtaining and/or reviewing separately obtained history (admission/discharge record), Performing a medically  appropriate examination and/or evaluation , Ordering medications/tests/procedures, referring and communicating with other health care professionals, Documenting clinical information in the EMR, Independently interpreting results (not separately reported), Communicating results to the patient/family/caregiver, Counseling and educating the patient/family/caregiver and Care coordination (not separately reported).   Wilber Oliphant, [place] [facility] 03/26/2022, 3:47 PM

## 2022-03-27 ENCOUNTER — Other Ambulatory Visit: Payer: Self-pay

## 2022-03-27 ENCOUNTER — Inpatient Hospital Stay (HOSPITAL_COMMUNITY)
Admission: RE | Admit: 2022-03-27 | Discharge: 2022-03-27 | Disposition: A | Discharge status: Home / Self Care | Payer: HMO | Source: Ambulatory Visit | Attending: Infectious Diseases | Admitting: Infectious Diseases

## 2022-03-27 ENCOUNTER — Telehealth: Payer: Self-pay

## 2022-03-27 ENCOUNTER — Other Ambulatory Visit: Payer: HMO

## 2022-03-27 DIAGNOSIS — N321 Vesicointestinal fistula: Secondary | ICD-10-CM | POA: Insufficient documentation

## 2022-03-27 DIAGNOSIS — R197 Diarrhea, unspecified: Secondary | ICD-10-CM

## 2022-03-27 DIAGNOSIS — N824 Other female intestinal-genital tract fistulae: Secondary | ICD-10-CM | POA: Insufficient documentation

## 2022-03-27 DIAGNOSIS — N179 Acute kidney failure, unspecified: Secondary | ICD-10-CM | POA: Diagnosis not present

## 2022-03-27 LAB — CBC
HCT: 32.2 % — ABNORMAL LOW (ref 35.0–45.0)
Hemoglobin: 9.9 g/dL — ABNORMAL LOW (ref 11.7–15.5)
MCH: 25.8 pg — ABNORMAL LOW (ref 27.0–33.0)
MCHC: 30.7 g/dL — ABNORMAL LOW (ref 32.0–36.0)
MCV: 84.1 fL (ref 80.0–100.0)
Platelets: 133 10*3/uL — ABNORMAL LOW (ref 140–400)
RBC: 3.83 10*6/uL (ref 3.80–5.10)
RDW: 15.9 % — ABNORMAL HIGH (ref 11.0–15.0)
WBC: 7.3 10*3/uL (ref 3.8–10.8)

## 2022-03-27 LAB — COMPREHENSIVE METABOLIC PANEL
AG Ratio: 1.1 (calc) (ref 1.0–2.5)
ALT: 18 U/L (ref 6–29)
AST: 14 U/L (ref 10–35)
Albumin: 3.3 g/dL — ABNORMAL LOW (ref 3.6–5.1)
Alkaline phosphatase (APISO): 130 U/L (ref 37–153)
BUN/Creatinine Ratio: 27 (calc) — ABNORMAL HIGH (ref 6–22)
BUN: 169 mg/dL — ABNORMAL HIGH (ref 7–25)
CO2: 21 mmol/L (ref 20–32)
Calcium: 7.2 mg/dL — ABNORMAL LOW (ref 8.6–10.4)
Chloride: 98 mmol/L (ref 98–110)
Creat: 6.22 mg/dL — ABNORMAL HIGH (ref 0.60–1.00)
Globulin: 3 g/dL (calc) (ref 1.9–3.7)
Glucose, Bld: 271 mg/dL — ABNORMAL HIGH (ref 65–99)
Potassium: 3.6 mmol/L (ref 3.5–5.3)
Sodium: 137 mmol/L (ref 135–146)
Total Bilirubin: 0.6 mg/dL (ref 0.2–1.2)
Total Protein: 6.3 g/dL (ref 6.1–8.1)

## 2022-03-27 NOTE — Telephone Encounter (Signed)
Patient left voicemail requesting to speak with me. I contacted patient, no answer - left voicemail asking to return my call.   [place], CMA

## 2022-03-27 NOTE — Telephone Encounter (Signed)
Patient aware of results and to make sure she is hydrating. Patient aware to go to ER if she begins to have nausea and vomiting and to follow up with PCP or nephrology regarding elevated creatinine.   [place], CMA

## 2022-03-27 NOTE — Telephone Encounter (Signed)
-----   Message from Rosiland Oz, MD sent at 03/27/2022  7:57 AM EDT ----- Labs noted  Her BUN and Cr have gone up. She has reported me not eating well due to diarrhea and likely causing dehydration. She needs adequate hydration. If she has nausea, vomiting then, should go to the ED. Otherwise, she will need to fu with her PCP/nephrology regarding her elevated Creatinine.

## 2022-03-28 ENCOUNTER — Other Ambulatory Visit: Payer: Self-pay | Admitting: Infectious Diseases

## 2022-03-28 ENCOUNTER — Telehealth: Payer: Self-pay

## 2022-03-28 DIAGNOSIS — N321 Vesicointestinal fistula: Secondary | ICD-10-CM

## 2022-03-28 LAB — C. DIFFICILE GDH AND TOXIN A/B
GDH ANTIGEN: DETECTED
MICRO NUMBER:: 14137989
SPECIMEN QUALITY:: ADEQUATE
TOXIN A AND B: DETECTED

## 2022-03-28 MED ORDER — FIDAXOMICIN 200 MG PO TABS: 200.0000 mg | ORAL_TABLET | Freq: Two times a day (BID) | ORAL | 0 refills | Status: DC

## 2022-03-28 NOTE — Telephone Encounter (Addendum)
Called patient to discuss provider's message, no answer. Left HIPAA compliant voicemail requesting callback.    Also per Dr. [place] B[place]:  Rosiland Oz, MD  P [facility] Triage Nurse Pool Noted to have positive [place] ag as well as Toxin. Please send her a script of fidaxomicin '200mg'$  po bid for 10 days for C diff diarrhea.  Beryle Flock, RN

## 2022-03-28 NOTE — Telephone Encounter (Signed)
-----   Message from Rosiland Oz, MD sent at 03/28/2022  9:48 AM EDT ----- Team  I reviewed her CT - suspicioius for colovesical fistula as I had expected and had discussed with patient and husband during clinic visit.   Please let them know that I am ordering an urgent referral to Urology as well as ordering CT pelvis w rectal contrast for further evaluation of the ? Colovesical fistula as per radiologist's recommendations

## 2022-03-29 ENCOUNTER — Encounter: Payer: Self-pay | Admitting: Infectious Diseases

## 2022-03-29 NOTE — Progress Notes (Signed)
I was able to speak to patient's husband Susan [name] [phone]) regarding CT abdomen pelvis findings suspicious for colovesical fistula. I recommended to fu with [facility] office Monday  regarding urgent referral to Urology as well as CT pelvis w rectal contrast.   I recommended to start taking fidaxomicin as prescribed for C diff diarrhea. Patient was appreciative of the phone call.   Rosiland Oz, MD Infectious Disease Physician [facility] [address] [place], [place] [address] Phone: [phone]  Fax: [phone]

## 2022-03-30 ENCOUNTER — Emergency Department (HOSPITAL_COMMUNITY): Payer: HMO

## 2022-03-30 ENCOUNTER — Encounter (HOSPITAL_COMMUNITY): Payer: Self-pay

## 2022-03-30 ENCOUNTER — Inpatient Hospital Stay (HOSPITAL_COMMUNITY)
Admission: EM | Admit: 2022-03-30 | Discharge: 2022-04-07 | DRG: 674 | Disposition: A | Discharge status: Home Health | Payer: HMO | Attending: Family Medicine | Admitting: Family Medicine

## 2022-03-30 ENCOUNTER — Other Ambulatory Visit: Payer: Self-pay

## 2022-03-30 DIAGNOSIS — I25119 Atherosclerotic heart disease of native coronary artery with unspecified angina pectoris: Secondary | ICD-10-CM | POA: Diagnosis present

## 2022-03-30 DIAGNOSIS — Z794 Long term (current) use of insulin: Secondary | ICD-10-CM | POA: Diagnosis not present

## 2022-03-30 DIAGNOSIS — Z7901 Long term (current) use of anticoagulants: Secondary | ICD-10-CM

## 2022-03-30 DIAGNOSIS — E869 Volume depletion, unspecified: Secondary | ICD-10-CM | POA: Diagnosis present

## 2022-03-30 DIAGNOSIS — Z9641 Presence of insulin pump (external) (internal): Secondary | ICD-10-CM | POA: Diagnosis present

## 2022-03-30 DIAGNOSIS — L97929 Non-pressure chronic ulcer of unspecified part of left lower leg with unspecified severity: Secondary | ICD-10-CM | POA: Diagnosis present

## 2022-03-30 DIAGNOSIS — A0471 Enterocolitis due to Clostridium difficile, recurrent: Secondary | ICD-10-CM | POA: Diagnosis present

## 2022-03-30 DIAGNOSIS — L89619 Pressure ulcer of right heel, unspecified stage: Secondary | ICD-10-CM | POA: Diagnosis present

## 2022-03-30 DIAGNOSIS — Z992 Dependence on renal dialysis: Secondary | ICD-10-CM | POA: Diagnosis not present

## 2022-03-30 DIAGNOSIS — M1990 Unspecified osteoarthritis, unspecified site: Secondary | ICD-10-CM | POA: Diagnosis present

## 2022-03-30 DIAGNOSIS — T45515A Adverse effect of anticoagulants, initial encounter: Secondary | ICD-10-CM | POA: Diagnosis present

## 2022-03-30 DIAGNOSIS — I071 Rheumatic tricuspid insufficiency: Secondary | ICD-10-CM | POA: Diagnosis present

## 2022-03-30 DIAGNOSIS — Z6841 Body Mass Index (BMI) 40.0 and over, adult: Secondary | ICD-10-CM

## 2022-03-30 DIAGNOSIS — F419 Anxiety disorder, unspecified: Secondary | ICD-10-CM | POA: Diagnosis present

## 2022-03-30 DIAGNOSIS — L97219 Non-pressure chronic ulcer of right calf with unspecified severity: Secondary | ICD-10-CM | POA: Diagnosis present

## 2022-03-30 DIAGNOSIS — E039 Hypothyroidism, unspecified: Secondary | ICD-10-CM | POA: Diagnosis present

## 2022-03-30 DIAGNOSIS — I83012 Varicose veins of right lower extremity with ulcer of calf: Secondary | ICD-10-CM | POA: Diagnosis present

## 2022-03-30 DIAGNOSIS — I2510 Atherosclerotic heart disease of native coronary artery without angina pectoris: Secondary | ICD-10-CM | POA: Diagnosis not present

## 2022-03-30 DIAGNOSIS — Z9181 History of falling: Secondary | ICD-10-CM

## 2022-03-30 DIAGNOSIS — N179 Acute kidney failure, unspecified: Principal | ICD-10-CM | POA: Diagnosis present

## 2022-03-30 DIAGNOSIS — I4510 Unspecified right bundle-branch block: Secondary | ICD-10-CM | POA: Diagnosis present

## 2022-03-30 DIAGNOSIS — Z8249 Family history of ischemic heart disease and other diseases of the circulatory system: Secondary | ICD-10-CM

## 2022-03-30 DIAGNOSIS — Z955 Presence of coronary angioplasty implant and graft: Secondary | ICD-10-CM

## 2022-03-30 DIAGNOSIS — F331 Major depressive disorder, recurrent, moderate: Secondary | ICD-10-CM

## 2022-03-30 DIAGNOSIS — D631 Anemia in chronic kidney disease: Secondary | ICD-10-CM | POA: Diagnosis not present

## 2022-03-30 DIAGNOSIS — N185 Chronic kidney disease, stage 5: Secondary | ICD-10-CM | POA: Diagnosis not present

## 2022-03-30 DIAGNOSIS — Z90710 Acquired absence of both cervix and uterus: Secondary | ICD-10-CM

## 2022-03-30 DIAGNOSIS — Z885 Allergy status to narcotic agent status: Secondary | ICD-10-CM

## 2022-03-30 DIAGNOSIS — I480 Paroxysmal atrial fibrillation: Secondary | ICD-10-CM | POA: Diagnosis present

## 2022-03-30 DIAGNOSIS — E11649 Type 2 diabetes mellitus with hypoglycemia without coma: Secondary | ICD-10-CM | POA: Diagnosis not present

## 2022-03-30 DIAGNOSIS — Z8673 Personal history of transient ischemic attack (TIA), and cerebral infarction without residual deficits: Secondary | ICD-10-CM

## 2022-03-30 DIAGNOSIS — N2581 Secondary hyperparathyroidism of renal origin: Secondary | ICD-10-CM | POA: Diagnosis present

## 2022-03-30 DIAGNOSIS — N250 Renal osteodystrophy: Secondary | ICD-10-CM | POA: Diagnosis present

## 2022-03-30 DIAGNOSIS — E1122 Type 2 diabetes mellitus with diabetic chronic kidney disease: Secondary | ICD-10-CM | POA: Diagnosis present

## 2022-03-30 DIAGNOSIS — I5042 Chronic combined systolic (congestive) and diastolic (congestive) heart failure: Secondary | ICD-10-CM | POA: Diagnosis present

## 2022-03-30 DIAGNOSIS — E1165 Type 2 diabetes mellitus with hyperglycemia: Secondary | ICD-10-CM | POA: Diagnosis present

## 2022-03-30 DIAGNOSIS — N186 End stage renal disease: Secondary | ICD-10-CM | POA: Diagnosis present

## 2022-03-30 DIAGNOSIS — Z87891 Personal history of nicotine dependence: Secondary | ICD-10-CM | POA: Diagnosis not present

## 2022-03-30 DIAGNOSIS — E1142 Type 2 diabetes mellitus with diabetic polyneuropathy: Secondary | ICD-10-CM | POA: Diagnosis present

## 2022-03-30 DIAGNOSIS — I878 Other specified disorders of veins: Secondary | ICD-10-CM | POA: Diagnosis present

## 2022-03-30 DIAGNOSIS — L97211 Non-pressure chronic ulcer of right calf limited to breakdown of skin: Secondary | ICD-10-CM | POA: Diagnosis not present

## 2022-03-30 DIAGNOSIS — I83029 Varicose veins of left lower extremity with ulcer of unspecified site: Secondary | ICD-10-CM | POA: Diagnosis present

## 2022-03-30 DIAGNOSIS — Z7982 Long term (current) use of aspirin: Secondary | ICD-10-CM

## 2022-03-30 DIAGNOSIS — M109 Gout, unspecified: Secondary | ICD-10-CM | POA: Diagnosis present

## 2022-03-30 DIAGNOSIS — I872 Venous insufficiency (chronic) (peripheral): Secondary | ICD-10-CM | POA: Diagnosis present

## 2022-03-30 DIAGNOSIS — I132 Hypertensive heart and chronic kidney disease with heart failure and with stage 5 chronic kidney disease, or end stage renal disease: Secondary | ICD-10-CM | POA: Diagnosis present

## 2022-03-30 DIAGNOSIS — Z89411 Acquired absence of right great toe: Secondary | ICD-10-CM

## 2022-03-30 DIAGNOSIS — Z89421 Acquired absence of other right toe(s): Secondary | ICD-10-CM

## 2022-03-30 DIAGNOSIS — I252 Old myocardial infarction: Secondary | ICD-10-CM

## 2022-03-30 DIAGNOSIS — D62 Acute posthemorrhagic anemia: Secondary | ICD-10-CM | POA: Diagnosis not present

## 2022-03-30 DIAGNOSIS — Z79890 Hormone replacement therapy (postmenopausal): Secondary | ICD-10-CM

## 2022-03-30 DIAGNOSIS — I509 Heart failure, unspecified: Secondary | ICD-10-CM | POA: Diagnosis not present

## 2022-03-30 DIAGNOSIS — D6832 Hemorrhagic disorder due to extrinsic circulating anticoagulants: Secondary | ICD-10-CM | POA: Diagnosis present

## 2022-03-30 DIAGNOSIS — Z792 Long term (current) use of antibiotics: Secondary | ICD-10-CM

## 2022-03-30 DIAGNOSIS — R278 Other lack of coordination: Secondary | ICD-10-CM | POA: Diagnosis present

## 2022-03-30 DIAGNOSIS — Z881 Allergy status to other antibiotic agents status: Secondary | ICD-10-CM

## 2022-03-30 DIAGNOSIS — Z79899 Other long term (current) drug therapy: Secondary | ICD-10-CM

## 2022-03-30 DIAGNOSIS — Z87440 Personal history of urinary (tract) infections: Secondary | ICD-10-CM

## 2022-03-30 DIAGNOSIS — E785 Hyperlipidemia, unspecified: Secondary | ICD-10-CM | POA: Diagnosis present

## 2022-03-30 DIAGNOSIS — E11622 Type 2 diabetes mellitus with other skin ulcer: Secondary | ICD-10-CM | POA: Diagnosis not present

## 2022-03-30 LAB — CBC WITH DIFFERENTIAL/PLATELET
Abs Immature Granulocytes: 0.05 10*3/uL (ref 0.00–0.07)
Basophils Absolute: 0 10*3/uL (ref 0.0–0.1)
Basophils Relative: 0 %
Eosinophils Absolute: 0.3 10*3/uL (ref 0.0–0.5)
Eosinophils Relative: 4 %
HCT: 28 % — ABNORMAL LOW (ref 36.0–46.0)
Hemoglobin: 8.7 g/dL — ABNORMAL LOW (ref 12.0–15.0)
Immature Granulocytes: 1 %
Lymphocytes Relative: 4 %
Lymphs Abs: 0.3 10*3/uL — ABNORMAL LOW (ref 0.7–4.0)
MCH: 26.2 pg (ref 26.0–34.0)
MCHC: 31.1 g/dL (ref 30.0–36.0)
MCV: 84.3 fL (ref 80.0–100.0)
Monocytes Absolute: 0.7 10*3/uL (ref 0.1–1.0)
Monocytes Relative: 9 %
Neutro Abs: 6.9 10*3/uL (ref 1.7–7.7)
Neutrophils Relative %: 82 %
Platelets: 116 10*3/uL — ABNORMAL LOW (ref 150–400)
RBC: 3.32 MIL/uL — ABNORMAL LOW (ref 3.87–5.11)
RDW: 17.1 % — ABNORMAL HIGH (ref 11.5–15.5)
WBC: 8.4 10*3/uL (ref 4.0–10.5)
nRBC: 0 % (ref 0.0–0.2)

## 2022-03-30 LAB — LACTIC ACID, PLASMA
Lactic Acid, Venous: 1.1 mmol/L (ref 0.5–1.9)
Lactic Acid, Venous: 1.1 mmol/L (ref 0.5–1.9)

## 2022-03-30 LAB — BASIC METABOLIC PANEL
Anion gap: 18 — ABNORMAL HIGH (ref 5–15)
BUN: 177 mg/dL — ABNORMAL HIGH (ref 8–23)
CO2: 21 mmol/L — ABNORMAL LOW (ref 22–32)
Calcium: 6.9 mg/dL — ABNORMAL LOW (ref 8.9–10.3)
Chloride: 97 mmol/L — ABNORMAL LOW (ref 98–111)
Creatinine, Ser: 7.14 mg/dL — ABNORMAL HIGH (ref 0.44–1.00)
GFR, Estimated: 6 mL/min — ABNORMAL LOW (ref >60)
Glucose, Bld: 261 mg/dL — ABNORMAL HIGH (ref 70–99)
Potassium: 3.4 mmol/L — ABNORMAL LOW (ref 3.5–5.1)
Sodium: 136 mmol/L (ref 135–145)

## 2022-03-30 LAB — CBG MONITORING, ED: Glucose-Capillary: 311 mg/dL — ABNORMAL HIGH (ref 70–99)

## 2022-03-30 LAB — HEMOGLOBIN AND HEMATOCRIT, BLOOD
HCT: 28.8 % — ABNORMAL LOW (ref 36.0–46.0)
Hemoglobin: 8.9 g/dL — ABNORMAL LOW (ref 12.0–15.0)

## 2022-03-30 MED ORDER — FIDAXOMICIN 200 MG PO TABS
200.0000 mg | ORAL_TABLET | Freq: Two times a day (BID) | ORAL | Status: AC
  Administered 2022-03-30 – 2022-04-06 (×12): 200 mg via ORAL
  Filled 2022-03-30 (×14): qty 1

## 2022-03-30 MED ORDER — LINAGLIPTIN 5 MG PO TABS
5.0000 mg | ORAL_TABLET | Freq: Every day | ORAL | Status: DC
  Administered 2022-03-31 – 2022-04-07 (×8): 5 mg via ORAL
  Filled 2022-03-30 (×8): qty 1

## 2022-03-30 MED ORDER — INSULIN ASPART 100 UNIT/ML IJ SOLN: 0.0000 [IU] | Freq: Three times a day (TID) | INTRAMUSCULAR | Status: DC

## 2022-03-30 MED ORDER — SODIUM CHLORIDE 0.9 % IV SOLN
20.0000 ug | Freq: Once | INTRAVENOUS | Status: AC
  Administered 2022-03-30: 20 ug via INTRAVENOUS
  Filled 2022-03-30: qty 5

## 2022-03-30 MED ORDER — ASPIRIN 81 MG PO TBEC
81.0000 mg | DELAYED_RELEASE_TABLET | Freq: Every day | ORAL | Status: DC
  Administered 2022-03-31 – 2022-04-07 (×7): 81 mg via ORAL
  Filled 2022-03-30 (×7): qty 1

## 2022-03-30 MED ORDER — LEVOTHYROXINE SODIUM 50 MCG PO TABS
50.0000 ug | ORAL_TABLET | Freq: Every day | ORAL | Status: DC
  Administered 2022-03-31 – 2022-04-06 (×7): 50 ug via ORAL
  Filled 2022-03-30 (×6): qty 1
  Filled 2022-03-30: qty 2

## 2022-03-30 MED ORDER — ATORVASTATIN CALCIUM 80 MG PO TABS
80.0000 mg | ORAL_TABLET | Freq: Every day | ORAL | Status: DC
  Administered 2022-03-31 – 2022-04-07 (×8): 80 mg via ORAL
  Filled 2022-03-30 (×2): qty 1
  Filled 2022-03-30: qty 2
  Filled 2022-03-30 (×5): qty 1

## 2022-03-30 MED ORDER — COLCHICINE 0.6 MG PO TABS: 0.6000 mg | ORAL_TABLET | Freq: Every day | ORAL | Status: DC | PRN

## 2022-03-30 MED ORDER — URSODIOL 500 MG PO TABS: 500.0000 mg | ORAL_TABLET | Freq: Three times a day (TID) | ORAL | Status: DC

## 2022-03-30 MED ORDER — ALBUTEROL SULFATE HFA 108 (90 BASE) MCG/ACT IN AERS: 1.0000 | INHALATION_SPRAY | Freq: Four times a day (QID) | RESPIRATORY_TRACT | Status: DC | PRN

## 2022-03-30 MED ORDER — PREGABALIN 75 MG PO CAPS
150.0000 mg | ORAL_CAPSULE | Freq: Two times a day (BID) | ORAL | Status: DC
  Administered 2022-03-30 – 2022-04-03 (×7): 150 mg via ORAL
  Filled 2022-03-30 (×7): qty 2

## 2022-03-30 MED ORDER — HEPARIN SODIUM (PORCINE) 5000 UNIT/ML IJ SOLN
5000.0000 [IU] | Freq: Two times a day (BID) | INTRAMUSCULAR | Status: DC
  Administered 2022-03-31 – 2022-04-05 (×8): 5000 [IU] via SUBCUTANEOUS
  Filled 2022-03-30 (×9): qty 1

## 2022-03-30 MED ORDER — HYDROMORPHONE HCL 1 MG/ML IJ SOLN
1.0000 mg | Freq: Once | INTRAMUSCULAR | Status: AC | PRN
  Administered 2022-03-30: 1 mg via INTRAVENOUS
  Filled 2022-03-30: qty 1

## 2022-03-30 MED ORDER — SODIUM CHLORIDE 0.9 % IV SOLN: INTRAVENOUS | Status: AC

## 2022-03-30 MED ORDER — ALBUTEROL SULFATE (2.5 MG/3ML) 0.083% IN NEBU: 2.5000 mg | INHALATION_SOLUTION | Freq: Four times a day (QID) | RESPIRATORY_TRACT | Status: DC | PRN

## 2022-03-30 MED ORDER — VORTIOXETINE HBR 20 MG PO TABS
20.0000 mg | ORAL_TABLET | Freq: Every day | ORAL | Status: DC
  Administered 2022-03-31 – 2022-04-07 (×7): 20 mg via ORAL
  Filled 2022-03-30 (×8): qty 1

## 2022-03-30 MED ORDER — URSODIOL 300 MG PO CAPS
300.0000 mg | ORAL_CAPSULE | Freq: Every day | ORAL | Status: DC
  Administered 2022-03-31 – 2022-04-07 (×7): 300 mg via ORAL
  Filled 2022-03-30 (×8): qty 1

## 2022-03-30 MED ORDER — HYDRALAZINE HCL 25 MG PO TABS: 25.0000 mg | ORAL_TABLET | Freq: Four times a day (QID) | ORAL | Status: DC | PRN

## 2022-03-30 MED ORDER — AMIODARONE HCL 200 MG PO TABS
200.0000 mg | ORAL_TABLET | Freq: Every day | ORAL | Status: DC
  Administered 2022-03-31 – 2022-04-07 (×8): 200 mg via ORAL
  Filled 2022-03-30 (×8): qty 1

## 2022-03-30 MED ORDER — FERROUS SULFATE 325 (65 FE) MG PO TABS
325.0000 mg | ORAL_TABLET | Freq: Two times a day (BID) | ORAL | Status: DC
  Administered 2022-03-30 – 2022-04-01 (×5): 325 mg via ORAL
  Filled 2022-03-30 (×5): qty 1

## 2022-03-30 NOTE — H&P (Signed)
History and Physical    Susan [name] JZP:[id number] DOB: 15-Sep-1950 DOA: 03/30/2022  PCP: [name], MD (Confirm with patient/family/NH records and if not entered, this has to be entered at [facility] point of entry) Patient coming from: Home  I have personally briefly reviewed patient's old medical records in [place]  Chief Complaint: Right shin bleeding  HPI: Susan [name] is a 71 y.o. female with medical history significant of PAF on Eliquis, CKD stage IV, CAD, chronic HFpEF, morbid obesity, IIDM, chronic venous stasis wound on right leg, recent recurrent C. difficile colitis, recurrent UTIs, presented with bleeding from the right leg venous stasis ulcer.  Patient developed a right shin area venous stasis ulcer 3 weeks ago, for which she been following with orthopedic surgery.  No signs or symptoms of infection and she is not on antibiotics for that wound.  This morning, suddenly, she developed bleeding from the right leg ulcer large amount, associated pain, she denies any chest pain shortness of breath.  Chronically, she developed her first C. difficile colitis on September 28 this year was treated with vancomycin.  However this week, she started to have watery diarrhea again and tested positive for C. difficile again, and was started on for fidaxomycin started yesterday.  She has been intentionally drinking more fluid since the onset of diarrhea, and she wears diapers and she does change diaper frequently and did not notice any full smell or dark-colored staining on the diaper.  No fever chills.  Last dose of Eliquis was this morning around 8:00.  ED Course: Blood pressure borderline low, no tachycardia afebrile.  Hemoglobin 8.7 compared to baseline 9.9 platelet 116, BUN 177, creatinine 7.1.  Review of Systems: As per HPI otherwise 14 point review of systems negative.    Past Medical History:  Diagnosis Date   Acute MI ([place]) 1999; 2007   Anemia    hx   Anginal pain ([place])     Anxiety    ARF (acute renal failure) ([place]) 06/2017   [facility]   Arthritis    "generalized" (03/15/2014)   CAD (coronary artery disease)    MI in 2000 - MI  2007 - treated bare metal stent (no nuclear since then as 9/11)   Carotid artery disease (HCC)    CHF (congestive heart failure) ([place])    Chronic diastolic heart failure ([place])    a) ECHO (08/2013) EF 55-60% and RV function nl b) RHC (08/2013) RA 4, RV 30/5/7, PA 25/10 (16), PCWP 7, Fick CO/CI 6.3/2.7, PVR 1.5 WU, PA 61 and 66%   Daily headache    "~ every other day; since I fell in June" (03/15/2014)   Depression    Dyslipidemia    Dyspnea    Exertional shortness of breath    HTN (hypertension)    Hypothyroidism    Neuropathy    Obesity    Osteoarthritis    Peripheral neuropathy    PONV (postoperative nausea and vomiting)    RBBB (right bundle branch block)    Old   Stroke ([place])    mini strokes   Syncope    likely due to low blood sugar   Tachycardia    Sinus tachycardia   Type II diabetes mellitus ([place])    Type II   Urinary incontinence    Venous insufficiency     Past Surgical History:  Procedure Laterality Date   ABDOMINAL HYSTERECTOMY  1980's   AMPUTATION Right 02/24/2018   Procedure: RIGHT FOOT GREAT  TOE AND 2ND TOE AMPUTATION;  Surgeon: Newt Minion, MD;  Location: [place];  Service: Orthopedics;  Laterality: Right;   AMPUTATION Right 04/30/2018   Procedure: RIGHT TRANSMETATARSAL AMPUTATION;  Surgeon: Newt Minion, MD;  Location: [place];  Service: Orthopedics;  Laterality: Right;   BIOPSY  05/27/2020   Procedure: BIOPSY;  Surgeon: [name], DO;  [facility];  Service: Endoscopy;;   CATARACT EXTRACTION, BILATERAL Bilateral ?2013   COLONOSCOPY W/ POLYPECTOMY     COLONOSCOPY WITH PROPOFOL N/A 03/13/2019   Procedure: COLONOSCOPY WITH PROPOFOL;  Surgeon: Jerene Bears, MD;  Location: [place];  Service: Gastroenterology;  Laterality: N/A;   CORONARY ANGIOPLASTY WITH STENT  PLACEMENT  1999; 2007   "1 + 1"   ERCP N/A 02/03/2022   Procedure: ENDOSCOPIC RETROGRADE CHOLANGIOPANCREATOGRAPHY (ERCP);  Surgeon: Carol Ada, MD;  Location: [place];  Service: Gastroenterology;  Laterality: N/A;   ESOPHAGOGASTRODUODENOSCOPY (EGD) WITH PROPOFOL N/A 03/13/2019   Procedure: ESOPHAGOGASTRODUODENOSCOPY (EGD) WITH PROPOFOL;  Surgeon: Jerene Bears, MD;  Location: [facility] ENDOSCOPY;  Service: Gastroenterology;  Laterality: N/A;   ESOPHAGOGASTRODUODENOSCOPY (EGD) WITH PROPOFOL N/A 05/27/2020   Procedure: ESOPHAGOGASTRODUODENOSCOPY (EGD) WITH PROPOFOL;  Surgeon: [name], DO;  [facility];  Service: Endoscopy;  Laterality: N/A;   EYE SURGERY Bilateral    lazer   HEMOSTASIS CLIP PLACEMENT  03/13/2019   Procedure: HEMOSTASIS CLIP PLACEMENT;  Surgeon: Jerene Bears, MD;  Location: [facility] ENDOSCOPY;  Service: Gastroenterology;;   KNEE ARTHROSCOPY Left 10/25/2006   POLYPECTOMY  03/13/2019   Procedure: POLYPECTOMY;  Surgeon: Jerene Bears, MD;  Location: [facility] ENDOSCOPY;  Service: Gastroenterology;;   REMOVAL OF STONES  02/03/2022   Procedure: REMOVAL OF STONES;  Surgeon: Carol Ada, MD;  Location: [place];  Service: Gastroenterology;;   RIGHT HEART CATH N/A 07/24/2017   Procedure: RIGHT HEART CATH;  Surgeon: Jolaine Artist, MD;  Location: [place] CV LAB;  Service: Cardiovascular;  Laterality: N/A;   RIGHT HEART CATHETERIZATION N/A 09/22/2013   Procedure: RIGHT HEART CATH;  Surgeon: Jolaine Artist, MD;  Location: [facility] LAB;  Service: Cardiovascular;  Laterality: N/A;   SHOULDER ARTHROSCOPY WITH OPEN ROTATOR CUFF REPAIR Right 03/14/2014   Procedure: RIGHT SHOULDER ARTHROSCOPY WITH BICEPS RELEASE, OPEN SUBSCAPULA REPAIR, OPEN SUPRASPINATUS REPAIR.;  Surgeon: Meredith Pel, MD;  Location: [place];  Service: Orthopedics;  Laterality: Right;   SPHINCTEROTOMY  02/03/2022   Procedure: SPHINCTEROTOMY;  Surgeon: Carol Ada, MD;  Location: [place];   Service: Gastroenterology;;   TEE WITHOUT CARDIOVERSION N/A 02/04/2022   Procedure: TRANSESOPHAGEAL ECHOCARDIOGRAM (TEE);  Surgeon: Jolaine Artist, MD;  Location[facility] END[facility]COPY;  Service: Cardiovascular;  Laterality: N/A;   TOE AMPUTATION Right 02/24/2018   GREAT TOE AND 2ND TOE AMPUTATION   TUBAL LIGATION  1970's     reports that she quit smoking about 24 years ago. Her smoking use included cigarettes. She has a 96.00 pack-year smoking history. She has never used smokeless tobacco. She reports that she does not currently use alcohol. She reports that she does not use drugs.  Allergies  Allergen Reactions   Keflex [Cephalexin] Diarrhea   Codeine Nausea And Vomiting and Other (See Comments)    Family History  Problem Relation Age of Onset   Heart attack Mother 20     Prior to Admission medications   Medication Sig Start Date End Date Taking? Authorizing Provider  fidaxomicin (DIFICID) 200 MG TABS tablet Take 1 tablet (200 mg total) by  mouth 2 (two) times daily. 03/28/22   Rosiland Oz, MD  albuterol (VENTOLIN HFA) 108 (90 Base) MCG/ACT inhaler Inhale 1-2 puffs into the lungs every 6 (six) hours as needed for wheezing or shortness of breath.    [provider]  amiodarone (PACERONE) 200 MG tablet Take 1 tablet (200 mg total) by mouth daily. 01/29/22   [place], Maricela Bo, FNP  apixaban (ELIQUIS) 5 MG TABS tablet Take 1 tablet (5 mg total) by mouth 2 (two) times daily. 01/29/22   Rafael Bihari, FNP  aspirin EC 81 MG tablet Take 1 tablet (81 mg total) by mouth daily with breakfast. 05/28/20   Roxan Hockey, MD  atorvastatin (LIPITOR) 80 MG tablet TAKE 1 TABLET BY MOUTH EVERY DAY 03/03/22   [place], Maricela Bo, FNP  blood glucose meter kit and supplies Use up to four times daily as directed. 01/23/22   Ghimire, Henreitta Leber, MD  carvedilol (COREG) 6.25 MG tablet Take 1 tablet (6.25 mg total) by mouth 2 (two) times daily with a meal. 01/29/22   [place], Maricela Bo, FNP  colchicine  0.6 MG tablet Take 0.6 mg by mouth daily as needed (gout).    [provider]  Continuous Blood Gluc Sensor (FREESTYLE LIBRE 2 SENSOR) MISC  01/14/22   [provider]  diphenoxylate-atropine (LOMOTIL) 2.5-0.025 MG tablet Take 1 tablet by mouth 4 (four) times daily as needed for diarrhea or loose stools. 08/19/21   [name], MD  ferrous sulfate 325 (65 FE) MG tablet Take 325 mg by mouth 2 (two) times daily.    [provider]  Fingerstix Lancets MISC Use as directed to check blood sugars as need up to 4 times daily 01/23/22   Ghimire, Henreitta Leber, MD  glucose blood test strip use as directed to check blood sugars up to 4 times daily 01/23/22   Jonetta [place], MD  Insulin Pen Needle 32G X 4 MM MISC Use as directed 01/23/22   Jonetta [place], MD  isosorbide mononitrate (IMDUR) 30 MG 24 hr tablet Take 0.5 tablets (15 mg total) by mouth daily. 12/10/21   [place], Maricela Bo, FNP  levothyroxine (SYNTHROID, LEVOTHROID) 50 MCG tablet Take 1 tablet (50 mcg total) by mouth daily before breakfast. 11/07/16   Dena Billet B, PA-C  magnesium oxide (MAG-OX) 400 (240 Mg) MG tablet Take 1 tablet (400 mg total) by mouth daily. 07/18/21   [name], MD  metolazone (ZAROXOLYN) 2.5 MG tablet Take 1 tablet (2.5 mg total) by mouth daily for 2 days, THEN 1 tablet (2.5 mg total) once a week. Every Tuesday with an additional 20 meq of potassium. 02/25/22 03/29/22  Rafael Bihari, FNP  nitrofurantoin, macrocrystal-monohydrate, (MACROBID) 100 MG capsule Take 1 capsule (100 mg total) by mouth 2 (two) times daily. 03/21/22   [name], MD  nitroGLYCERIN (NITROSTAT) 0.4 MG SL tablet PLACE 1 TABLET (0.4 MG TOTAL) UNDER THE TONGUE EVERY 5 (FIVE) MINUTES AS NEEDED FOR CHEST PAIN. 02/06/21   Larey Dresser, MD  Nutritional Supplements (FRUIT & VEGETABLE DAILY PO) Take 1 tablet by mouth daily.    [provider]  nystatin (MYCOSTATIN/NYSTOP) powder Apply 1 Application topically  2 (two) times daily as needed for irritation. 10/07/21   [provider]  nystatin cream (MYCOSTATIN) Apply 1 Application topically 2 (two) times daily. 12/04/21   [provider]  OXYGEN Inhale 2 L/min into the lungs at bedtime.    [provider]  polyethylene glycol (MIRALAX /  GLYCOLAX) 17 g packet Take 17 g by mouth as needed for mild constipation.    [provider]  pregabalin (LYRICA) 150 MG capsule Take 150 mg by mouth 2 (two) times daily. 03/20/21   [provider]  senna-docusate (SENOKOT-S) 8.6-50 MG tablet Take 1 tablet by mouth 2 (two) times daily. Patient taking differently: Take 1 tablet by mouth 2 (two) times daily. As needed 02/11/22   Eugenie Filler, MD  torsemide (DEMADEX) 20 MG tablet Take 4 tablets (80 mg total) by mouth 2 (two) times daily. 03/06/22   Bensimhon, Shaune Pascal, MD  ursodiol (ACTIGALL) 500 MG tablet Take 500 mg by mouth 3 (three) times daily. 12/11/20   [provider]  vortioxetine HBr (TRINTELLIX) 10 MG TABS tablet Take 2 tablets (20 mg total) by mouth daily. 02/28/22   [name], MD    Physical Exam: Vitals:   03/30/22 1550 03/30/22 1615 03/30/22 1630 03/30/22 1645  BP:  112/84 (!) 127/100 (!) 135/56  Pulse: 73 75 77 78  Resp: _0 Temp:      TempSrc:      SpO2: 97% 97% 99% 97%    Constitutional: NAD, calm, comfortable Vitals:   03/30/22 1550 03/30/22 1615 03/30/22 1630 03/30/22 1645  BP:  112/84 (!) 127/100 (!) 135/56  Pulse: 73 75 77 78  Resp: _1 Temp:      TempSrc:      SpO2: 97% 97% 99% 97%   Eyes: PERRL, lids and conjunctivae normal ENMT: Mucous membranes are moist. Posterior pharynx clear of any exudate or lesions.Normal dentition.  Neck: normal, supple, no masses, no thyromegaly Respiratory: clear to auscultation bilaterally, no wheezing, no crackles. Normal respiratory effort. No accessory muscle use.  Cardiovascular: Regular rate and rhythm, no murmurs / rubs  / gallops. No extremity edema. 2+ pedal pulses. No carotid bruits.  Abdomen: no tenderness, no masses palpated. No hepatosplenomegaly. Bowel sounds positive.  Musculoskeletal: no clubbing / cyanosis. No joint deformity upper and lower extremities. Good ROM, no contractures. Normal muscle tone.  Skin: Right lateral side of shin area ulcer and bleeding, pressure dressing applied by ED physician Neurologic: CN 2-12 grossly intact. Sensation intact, DTR normal. Strength 5/5 in all 4.  Psychiatric: Normal judgment and insight. Alert and oriented x 3. Normal mood.    Labs on Admission: I have personally reviewed following labs and imaging studies  CBC: Recent Labs  Lab 03/26/22 1620 03/30/22 1018 03/30/22 1720  WBC 7.3 8.4  --   NEUTROABS  --  6.9  --   HGB 9.9* 8.7* 8.9*  HCT 32.2* 28.0* 28.8*  MCV 84.1 84.3  --   PLT 133* 116*  --    Basic Metabolic Panel: Recent Labs  Lab 03/26/22 1620 03/30/22 1018  NA 137 136  K 3.6 3.4*  CL 98 97*  CO2 21 21*  GLUCOSE 271* 261*  BUN 169* 177*  CREATININE 6.22* 7.14*  CALCIUM 7.2* 6.9*   GFR: CrCl cannot be calculated (Unknown ideal weight.). Liver Function Tests: Recent Labs  Lab 03/26/22 1620  AST 14  ALT 18  BILITOT 0.6  PROT 6.3   No results for input(s): "LIPASE", "AMYLASE" in the last 168 hours. No results for input(s): "AMMONIA" in the last 168 hours. Coagulation Profile: No results for input(s): "INR", "PROTIME" in the last 168 hours. Cardiac Enzymes: No results for input(s): "CKTOTAL", "CKMB", "CKMBINDEX", "TROPONINI" in the last 168 hours. BNP (last 3 results) No  results for input(s): "PROBNP" in the last 8760 hours. HbA1C: No results for input(s): "HGBA1C" in the last 72 hours. CBG: No results for input(s): "GLUCAP" in the last 168 hours. Lipid Profile: No results for input(s): "CHOL", "HDL", "LDLCALC", "TRIG", "CHOLHDL", "LDLDIRECT" in the last 72 hours. Thyroid Function Tests: No results for input(s): "TSH",  "T4TOTAL", "FREET4", "T3FREE", "THYROIDAB" in the last 72 hours. Anemia Panel: No results for input(s): "VITAMINB12", "FOLATE", "FERRITIN", "TIBC", "IRON", "RETICCTPCT" in the last 72 hours. Urine analysis:    Component Value Date/Time   COLORURINE YELLOW 02/05/2022 1520   APPEARANCEUR CLOUDY (A) 02/05/2022 1520   LABSPEC 1.010 02/05/2022 1520   PHURINE 5.0 02/05/2022 1520   GLUCOSEU >=500 (A) 02/05/2022 1520   HGBUR SMALL (A) 02/05/2022 1520   BILIRUBINUR NEGATIVE 02/05/2022 1520   BILIRUBINUR negative 06/05/2020 1334   KETONESUR NEGATIVE 02/05/2022 1520   PROTEINUR 30 (A) 02/05/2022 1520   UROBILINOGEN 0.2 06/05/2020 1334   UROBILINOGEN 0.2 04/01/2014 1659   NITRITE NEGATIVE 02/05/2022 1520   LEUKOCYTESUR LARGE (A) 02/05/2022 1520    Radiological Exams on Admission: DG Ankle Complete Right  Result Date: 03/30/2022 CLINICAL DATA:  Pain. EXAM: RIGHT ANKLE - COMPLETE 3+ VIEW COMPARISON:  None Available. FINDINGS: No fracture or dislocation. Bones are diffusely demineralized. Distal foot amputation incompletely visualized. IMPRESSION: 1. No acute bony findings. 2. Distal foot amputation incompletely visualized. Electronically Signed   By: Misty Stanley M.D.   On: 03/30/2022 11:24   DG Foot Complete Right  Result Date: 03/30/2022 CLINICAL DATA:  Blister on the back of the right heel. EXAM: RIGHT FOOT COMPLETE - 3+ VIEW COMPARISON:  None Available. 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 Great toe amputation at the level of the MTP joint. Bones are diffusely demineralized lateral film shows no bony destruction of the calcaneus to suggest osteomyelitis. 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 No bony destruction in the calcaneus to suggest evidence for osteomyelitis. Electronically Signed   By: Misty Stanley M.D.   On: 03/30/2022 11:23    EKG: Independently  reviewed.  Sinus, chronic RBBB  Assessment/Plan Principal Problem:   AKI (acute kidney injury) ([place])  (please populate well all problems here in Problem List. (For example, if patient is on BP meds at home and you resume or decide to hold them, it is a problem that needs to be her. Same for CAD, COPD, HLD and so on)  Right leg venous ulcer bleeding -Last dose of Eliquis was almost 1 half-life ago, no indication for reversal at this point as hemoglobin has been stable this afternoon compared to the level of this morning.  Recheck H&H this evening and tomorrow morning -Another barrier for hemostasis was still worsening of uremia, for which we will give 1 dose of DDAVP -Wound care follows, orthopedic surgery will see the patient tomorrow morning  AKI on CKD stage IV -Discussed with on-call nephrology, agreed with slow hydration for tonight, reconsult nephro tomorrow morning for possible HD evaluation.  PAF -Hold off Eliquis -Continue amiodarone  HTN -Blood pressure borderline low, hold off home BP meds, start as needed hydralazine  Chronic HFpEF -Volume depleted likely from diarrhea, other evidence of volume contraction is AKI, hold off diuresis.  Start hydration  Recurrent C. difficile colitis -Continue for Fidaxomycin  IDDM hyperglycemia -Sliding scale for now -May have insulin resistance, add Januvia  History of recurrent UTI -On chronic  suppression antibiotics, but we will hold off MicroBid for worsening of kidney function.  Gout -Hold off colchicine due to AKI  DVT prophylaxis: Heparin subcu starting tomorrow Code Status: Full code Family Communication: Husband at bedside Disposition Plan: Patient sick with AKI on CKD stage V worsening of uremic and uremic bleeding, requiring inpatient close monitoring and management, expect more than 2-day hospital stay Consults called: Orthopedic surgery, call nephrology in the morning Admission status: Telemetry admission   Lequita Halt MD [facility] Pager [phone]  03/30/2022, 6:01 PM

## 2022-03-30 NOTE — ED Notes (Addendum)
Pressure dressing applied to wound on LRE with bleeding controlled at this time. Limb elevated. RN unable to establish IV access, IV team consult placed for labs and line.

## 2022-03-30 NOTE — ED Provider Notes (Signed)
[facility] EMERGENCY DEPARTMENT Provider Note   CSN: [id number] Arrival date & time: 03/30/22  0935     History  Chief Complaint  Patient presents with   Wound Check    Susan [name] is a 71 y.o. female.  HPI 71 year old female with a history of multiple comorbidities presents with bleeding out of her right foot.  Noticed it at 8 AM.  She states that there was a lot of blood on the floor.  It bled through her Unna boots.  She thinks is coming from the chronic blister she has had on her right heel.  She follows with Dr. Sharol Given.  She has been having some chills for the last couple days but no fevers.  She is on Eliquis.  Home Medications Prior to Admission medications   Medication Sig Start Date End Date Taking? Authorizing Provider  albuterol (VENTOLIN HFA) 108 (90 Base) MCG/ACT inhaler Inhale 1-2 puffs into the lungs every 6 (six) hours as needed for wheezing or shortness of breath.   Yes [provider]  amiodarone (PACERONE) 200 MG tablet Take 1 tablet (200 mg total) by mouth daily. 01/29/22  Yes Milford, Maricela Bo, FNP  apixaban (ELIQUIS) 5 MG TABS tablet Take 1 tablet (5 mg total) by mouth 2 (two) times daily. 01/29/22  Yes Milford, Maricela Bo, FNP  aspirin EC 81 MG tablet Take 1 tablet (81 mg total) by mouth daily with breakfast. 05/28/20  Yes Emokpae, Courage, MD  atorvastatin (LIPITOR) 80 MG tablet TAKE 1 TABLET BY MOUTH EVERY DAY 03/03/22  Yes Milford, Maricela Bo, FNP  carvedilol (COREG) 6.25 MG tablet Take 1 tablet (6.25 mg total) by mouth 2 (two) times daily with a meal. 01/29/22  Yes Milford, Rosebud, FNP  colchicine 0.6 MG tablet Take 0.6 mg by mouth daily as needed (gout).   Yes [provider]  diphenoxylate-atropine (LOMOTIL) 2.5-0.025 MG tablet Take 1 tablet by mouth 4 (four) times daily as needed for diarrhea or loose stools. 08/19/21  Yes [name], MD  ferrous sulfate 325 (65 FE) MG tablet Take 325 mg by mouth 2 (two) times daily.    Yes [provider]  fidaxomicin (DIFICID) 200 MG TABS tablet Take 1 tablet (200 mg total) by mouth 2 (two) times daily. 03/28/22  Yes Rosiland Oz, MD  isosorbide mononitrate (IMDUR) 30 MG 24 hr tablet Take 0.5 tablets (15 mg total) by mouth daily. 12/10/21  Yes Milford, Maricela Bo, FNP  levothyroxine (SYNTHROID, LEVOTHROID) 50 MCG tablet Take 1 tablet (50 mcg total) by mouth daily before breakfast. 11/07/16  Yes Dena Billet B, PA-C  magnesium oxide (MAG-OX) 400 (240 Mg) MG tablet Take 1 tablet (400 mg total) by mouth daily. 07/18/21  Yes [name], MD  metolazone (ZAROXOLYN) 2.5 MG tablet Take 1 tablet (2.5 mg total) by mouth daily for 2 days, THEN 1 tablet (2.5 mg total) once a week. Every Tuesday with an additional 20 meq of potassium. 02/25/22 03/30/22 Yes Milford, Maricela Bo, FNP  nitrofurantoin, macrocrystal-monohydrate, (MACROBID) 100 MG capsule Take 1 capsule (100 mg total) by mouth 2 (two) times daily. 03/21/22  Yes [name], MD  nitroGLYCERIN (NITROSTAT) 0.4 MG SL tablet PLACE 1 TABLET (0.4 MG TOTAL) UNDER THE TONGUE EVERY 5 (FIVE) MINUTES AS NEEDED FOR CHEST PAIN. 02/06/21  Yes Larey Dresser, MD  OXYGEN Inhale 2 L/min into the lungs at bedtime.   Yes [provider]  polyethylene glycol (MIRALAX / GLYCOLAX) 17 g packet  Take 17 g by mouth as needed for mild constipation.   Yes [provider]  pregabalin (LYRICA) 150 MG capsule Take 150 mg by mouth 2 (two) times daily. 03/20/21  Yes [provider]  senna-docusate (SENOKOT-S) 8.6-50 MG tablet Take 1 tablet by mouth 2 (two) times daily. Patient taking differently: Take 1 tablet by mouth daily as needed for mild constipation or moderate constipation. 02/11/22  Yes Eugenie Filler, MD  torsemide (DEMADEX) 20 MG tablet Take 4 tablets (80 mg total) by mouth 2 (two) times daily. 03/06/22  Yes Bensimhon, Shaune Pascal, MD  ursodiol (ACTIGALL) 500 MG tablet Take 500 mg by mouth 3 (three) times daily.  12/11/20  Yes [provider]  blood glucose meter kit and supplies Use up to four times daily as directed. 01/23/22   Ghimire, Henreitta Leber, MD  Continuous Blood Gluc Sensor (FREESTYLE LIBRE 2 SENSOR) MISC  01/14/22   [provider]  Fingerstix Lancets MISC Use as directed to check blood sugars as need up to 4 times daily 01/23/22   Ghimire, Henreitta Leber, MD  glucose blood test strip use as directed to check blood sugars up to 4 times daily 01/23/22   Jonetta Osgood, MD  Insulin Pen Needle 32G X 4 MM MISC Use as directed 01/23/22   Jonetta Osgood, MD  vortioxetine HBr (TRINTELLIX) 10 MG TABS tablet Take 2 tablets (20 mg total) by mouth daily. Patient not taking: Reported on 03/30/2022 02/28/22   [name], MD      Allergies    Keflex [cephalexin] and Codeine    Review of Systems   Review of Systems  Constitutional:  Positive for chills.  Skin:  Positive for wound.  Neurological:  Positive for light-headedness.    Physical Exam Updated Vital Signs BP (!) 125/33   Pulse 81   Temp 97.7 F (36.5 C) (Oral)   Resp 17   SpO2 96%  Physical Exam Vitals and nursing note reviewed.  Constitutional:      Appearance: She is well-developed.  HENT:     Head: Normocephalic and atraumatic.  Pulmonary:     Effort: Pulmonary effort is normal.  Abdominal:     General: There is no distension.  Musculoskeletal:     Comments: Both legs are in [name].  The right Unna boot has a large amount of blood soaking in it.  Took a while to remove the right Unna boot but then there are 3 different areas that seem to be bleeding, see pictures.  There is one that transiently had some arterial bleeding.  Skin:    General: Skin is warm and dry.  Neurological:     Mental Status: She is alert.          ED Results / Procedures / Treatments   Labs (all labs ordered are listed, but only abnormal results are displayed) Labs Reviewed  CBC WITH DIFFERENTIAL/PLATELET - Abnormal;  Notable for the following components:      Result Value   RBC 3.32 (*)    Hemoglobin 8.7 (*)    HCT 28.0 (*)    RDW 17.1 (*)    Platelets 116 (*)    Lymphs Abs 0.3 (*)    All other components within normal limits  BASIC METABOLIC PANEL - Abnormal; Notable for the following components:   Potassium 3.4 (*)    Chloride 97 (*)    CO2 21 (*)    Glucose, Bld 261 (*)  BUN 177 (*)    Creatinine, Ser 7.14 (*)    Calcium 6.9 (*)    GFR, Estimated 6 (*)    Anion gap 18 (*)    All other components within normal limits  HEMOGLOBIN AND HEMATOCRIT, BLOOD - Abnormal; Notable for the following components:   Hemoglobin 8.9 (*)    HCT 28.8 (*)    All other components within normal limits  LACTIC ACID, PLASMA  LACTIC ACID, PLASMA  HEMOGLOBIN AND HEMATOCRIT, BLOOD  CBC  BASIC METABOLIC PANEL    EKG None  Radiology DG Ankle Complete Right  Result Date: 03/30/2022 CLINICAL DATA:  Pain. EXAM: RIGHT ANKLE - COMPLETE 3+ VIEW COMPARISON:  None Available. FINDINGS: No fracture or dislocation. Bones are diffusely demineralized. Distal foot amputation incompletely visualized. IMPRESSION: 1. No acute bony findings. 2. Distal foot amputation incompletely visualized. Electronically Signed   By: Misty Stanley M.D.   On: 03/30/2022 11:24   DG Foot Complete Right  Result Date: 03/30/2022 CLINICAL DATA:  Blister on the back of the right heel. EXAM: RIGHT FOOT COMPLETE - 3+ VIEW COMPARISON:  None Available. 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 Great toe amputation at the level of the MTP joint. Bones are diffusely demineralized lateral film shows no bony destruction of the calcaneus to suggest osteomyelitis. 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 No bony destruction in the calcaneus to suggest evidence for osteomyelitis. Electronically Signed   By: Misty Stanley M.D.   On:  03/30/2022 11:23    Procedures Procedures    Medications Ordered in ED Medications  aspirin EC tablet 81 mg (has no administration in time range)  fidaxomicin (DIFICID) tablet 200 mg (has no administration in time range)  amiodarone (PACERONE) tablet 200 mg (has no administration in time range)  atorvastatin (LIPITOR) tablet 80 mg (has no administration in time range)  vortioxetine HBr (TRINTELLIX) tablet 20 mg (has no administration in time range)  levothyroxine (SYNTHROID) tablet 50 mcg (has no administration in time range)  ferrous sulfate tablet 325 mg (has no administration in time range)  pregabalin (LYRICA) capsule 150 mg (has no administration in time range)  0.9 %  sodium chloride infusion ( Intravenous New Bag/Given 03/30/22 2022)  heparin injection 5,000 Units (has no administration in time range)  insulin aspart (novoLOG) injection 0-9 Units (has no administration in time range)  desmopressin (DDAVP) 20 mcg in sodium chloride 0.9 % 50 mL IVPB (20 mcg Intravenous New Bag/Given 03/30/22 2022)  hydrALAZINE (APRESOLINE) tablet 25 mg (has no administration in time range)  linagliptin (TRADJENTA) tablet 5 mg (has no administration in time range)  albuterol (PROVENTIL) (2.5 MG/3ML) 0.083% nebulizer solution 2.5 mg (has no administration in time range)  ursodiol (ACTIGALL) capsule 300 mg (has no administration in time range)  HYDROmorphone (DILAUDID) injection 1 mg (1 mg Intravenous Given 03/30/22 2018)    ED Course/ Medical Decision Making/ A&P                           Medical Decision Making Amount and/or Complexity of Data Reviewed Labs: ordered.    Details: Hemoglobin of 8.7 is mildly lower than most recent but near her baseline and repeat is 8.9.  However her BUN and creatinine are little higher than baseline. Radiology: independent interpretation performed.    Details: No obvious osteomyelitis ECG/medicine tests: independent interpretation  performed.    Details:  RBBB  Risk Prescription drug management. Decision regarding hospitalization.   Patient presents with bleeding from multiple ulcerations in her leg/heel.  There was some partial control initially with direct pressure and then we also applied combat gauze.  Seems to be on and off bleeding.  Due to this I discussed with her orthopedist, Dr. Sharol Given.  He recommends pressure dressing and considering reversal of Eliquis if needed.  Fortunately this has seemed to stop the bleeding.  He will see in consultation.  She also seems to have a worsening chronic kidney disease and uremia.  At this point I think we can hold off on reversal but she will need admission and close monitoring.  Discussed with hospitalist, Dr. Roosevelt Locks, for admission.        Final Clinical Impression(s) / ED Diagnoses Final diagnoses:  Venous stasis ulcer of right calf, unspecified ulcer stage, unspecified whether varicose veins present [facility])    Rx / DC Orders ED Discharge Orders     None         Sherwood Gambler, MD 03/30/22 2036

## 2022-03-30 NOTE — ED Triage Notes (Signed)
Pt arrived POV from home c/o having a blister on her right heel that has been treated outpatient. Pt has an ulna boot on bilateral legs. Pt is bleeding through the unla boot and sock. Pt states she is on Eliquis.

## 2022-03-30 NOTE — ED Provider Triage Note (Signed)
Emergency Medicine Provider Triage Evaluation Note  Susan [name] , a 71 y.o. female  was evaluated in triage.  Pt complains of right foot wound and bleeding.  Patient under treatment Dr. Sharol Given has been dealing with foot wound for over 1 month.  Patient had granulation tissue removed this past week?Marland Kitchen  She is on Eliquis.  Bleeding was noticed this morning.  Review of Systems  Positive: Bleeding, chills Negative: Fall, injury or any additional concerns.  Physical Exam  There were no vitals taken for this visit. Gen:   Awake, no distress   Resp:  Normal effort  MSK:   Moves extremities without difficulty.  Unna boots present bilaterally.  Small amount of venous blood dripping from the heel of the right Unna boot.  Medical Decision Making  Medically screening exam initiated at 10:02 AM.  Appropriate orders placed.  Susan [name] was informed that the remainder of the evaluation will be completed by another provider, this initial triage assessment does not replace that evaluation, and the importance of remaining in the ED until their evaluation is complete.   Note: Portions of this report may have been transcribed using voice recognition software. Every effort was made to ensure accuracy; however, inadvertent computerized transcription errors may still be present.    Susan Boston, PA-C 03/30/22 1013

## 2022-03-30 NOTE — ED Notes (Signed)
IV team at bedside 

## 2022-03-31 ENCOUNTER — Ambulatory Visit: Payer: HMO | Admitting: Orthopedic Surgery

## 2022-03-31 DIAGNOSIS — L97219 Non-pressure chronic ulcer of right calf with unspecified severity: Secondary | ICD-10-CM

## 2022-03-31 DIAGNOSIS — N179 Acute kidney failure, unspecified: Secondary | ICD-10-CM | POA: Diagnosis not present

## 2022-03-31 DIAGNOSIS — I83012 Varicose veins of right lower extremity with ulcer of calf: Secondary | ICD-10-CM

## 2022-03-31 LAB — CBC
HCT: 24.1 % — ABNORMAL LOW (ref 36.0–46.0)
Hemoglobin: 7.5 g/dL — ABNORMAL LOW (ref 12.0–15.0)
MCH: 26 pg (ref 26.0–34.0)
MCHC: 31.1 g/dL (ref 30.0–36.0)
MCV: 83.7 fL (ref 80.0–100.0)
Platelets: 104 10*3/uL — ABNORMAL LOW (ref 150–400)
RBC: 2.88 MIL/uL — ABNORMAL LOW (ref 3.87–5.11)
RDW: 17.2 % — ABNORMAL HIGH (ref 11.5–15.5)
WBC: 9.8 10*3/uL (ref 4.0–10.5)
nRBC: 0 % (ref 0.0–0.2)

## 2022-03-31 LAB — BASIC METABOLIC PANEL
Anion gap: 19 — ABNORMAL HIGH (ref 5–15)
BUN: 180 mg/dL — ABNORMAL HIGH (ref 8–23)
CO2: 19 mmol/L — ABNORMAL LOW (ref 22–32)
Calcium: 6.9 mg/dL — ABNORMAL LOW (ref 8.9–10.3)
Chloride: 101 mmol/L (ref 98–111)
Creatinine, Ser: 7.09 mg/dL — ABNORMAL HIGH (ref 0.44–1.00)
GFR, Estimated: 6 mL/min — ABNORMAL LOW (ref >60)
Glucose, Bld: 275 mg/dL — ABNORMAL HIGH (ref 70–99)
Potassium: 3.4 mmol/L — ABNORMAL LOW (ref 3.5–5.1)
Sodium: 139 mmol/L (ref 135–145)

## 2022-03-31 LAB — CBG MONITORING, ED
Glucose-Capillary: 266 mg/dL — ABNORMAL HIGH (ref 70–99)
Glucose-Capillary: 238 mg/dL — ABNORMAL HIGH (ref 70–99)
Glucose-Capillary: 218 mg/dL — ABNORMAL HIGH (ref 70–99)

## 2022-03-31 LAB — HEMOGLOBIN AND HEMATOCRIT, BLOOD
HCT: 25.7 % — ABNORMAL LOW (ref 36.0–46.0)
Hemoglobin: 8 g/dL — ABNORMAL LOW (ref 12.0–15.0)

## 2022-03-31 MED ORDER — INSULIN PUMP
Freq: Three times a day (TID) | SUBCUTANEOUS | Status: DC
  Administered 2022-03-31: 2.65 via SUBCUTANEOUS
  Administered 2022-03-31: 1.9 via SUBCUTANEOUS
  Filled 2022-03-31: qty 1

## 2022-03-31 MED ORDER — FUROSEMIDE 10 MG/ML IJ SOLN
120.0000 mg | Freq: Three times a day (TID) | INTRAVENOUS | Status: DC
  Administered 2022-03-31 (×2): 120 mg via INTRAVENOUS
  Filled 2022-03-31 (×3): qty 12
  Filled 2022-03-31: qty 10
  Filled 2022-03-31 (×2): qty 12
  Filled 2022-03-31: qty 10
  Filled 2022-03-31 (×4): qty 12

## 2022-03-31 MED ORDER — OXYCODONE HCL 5 MG PO TABS
5.0000 mg | ORAL_TABLET | Freq: Once | ORAL | Status: AC
  Administered 2022-03-31: 5 mg via ORAL
  Filled 2022-03-31: qty 1

## 2022-03-31 MED ORDER — INSULIN ASPART 100 UNIT/ML IJ SOLN
0.0000 [IU] | Freq: Every day | INTRAMUSCULAR | Status: DC
  Administered 2022-03-31: 2 [IU] via SUBCUTANEOUS

## 2022-03-31 MED ORDER — INSULIN ASPART 100 UNIT/ML IJ SOLN
0.0000 [IU] | Freq: Three times a day (TID) | INTRAMUSCULAR | Status: DC
  Administered 2022-04-01 (×2): 1 [IU] via SUBCUTANEOUS

## 2022-03-31 NOTE — Progress Notes (Signed)
PROGRESS NOTE    Susan [name]  OAC:[id number] DOB: December 31, 1950 DOA: 03/30/2022 PCP: Donita Brooks, MD  Chief Complaint  Patient presents with   Wound Check    Brief Narrative:  Susan [name] is Susan [name] 71 y.o. female with medical history significant of PAF on Eliquis, CKD stage IV, CAD, chronic HFpEF, morbid obesity, IIDM, chronic venous stasis wound on right leg, recent recurrent C. difficile colitis, recurrent UTIs, presented with bleeding from the right leg venous stasis ulcer.   Patient developed [place] right shin area venous stasis ulcer 3 weeks ago, for which she been following with orthopedic surgery.  No signs or symptoms of infection and she is not on antibiotics for that wound.  This morning, suddenly, she developed bleeding from the right leg ulcer large amount, associated pain, she denies any chest pain shortness of breath.  Chronically, she developed her first C. difficile colitis on September 28 this year was treated with vancomycin.  However this week, she started to have watery diarrhea again and tested positive for C. difficile again, and was started on for fidaxomycin started yesterday.  She has been intentionally drinking more fluid since the onset of diarrhea, and she wears diapers and she does change diaper frequently and did not notice any full smell or dark-colored staining on the diaper.  No fever chills.  Last dose of Eliquis was this morning around 8:00.   ED Course: Blood pressure borderline low, no tachycardia afebrile.  Hemoglobin 8.7 compared to baseline 9.9 platelet 116, BUN 177, creatinine 7.1.  Assessment & Plan:   Principal Problem:   AKI (acute kidney injury) (HCC) Active Problems:   Venous stasis ulcer of right calf (HCC)   Assessment and Plan: Acute Kidney Injury on CKD IV or V  Uremia Baseline creatinine appears to be ~3-5 over the past year or so, recently worsening with creatinine 7.09 + asterixis, mild confusion she reports for the past week or so Will  consult renal for assistance   Chronic HFrEF (EF 30%) - Echo 01/2022 TEE with EF 30%, RVSF moderately reduced (see report) - will ask for assistance from HF team, appreciate assistance  Right leg venous ulcer bleeding  Bleeding from Varicose Veins secondary to Anticoagulation therapy for Atrial Fibrillation -holding eliquis -s/p DDAVP in setting of her uremia -appreciate orthopedic assistance, recommending UNNA boots and prafo boot for right    Acute Blood Loss Anemia In setting of above, downtrending Follow type and screen, repeat H/H Transfuse prn    PAF -Hold off Eliquis due to bleeding above -Continue amiodarone   HTN -Blood pressure borderline low, hold off home BP meds, start as needed hydralazine -low threshold for transfusion   Recurrent C. difficile colitis -Continue for Fidaxomycin -saw ID on 11/1, was prescribed fidaxomicin on 11/3 for her C difficile    Concern for Colovesical Fistula - discussed with urology, consider CT cystogram while she's here  IDDM hyperglycemia -has insulin pump, per  -May have insulin resistance, add Januvia   History of recurrent UTI -On chronic suppression antibiotics, but we will hold off MicroBid for now   Gout -Hold off colchicine due to AKI     DVT prophylaxis: SCD Code Status: heparin Family Communication: none Disposition:   Status is: Inpatient Remains inpatient appropriate because: pending further improvement   Consultants:  Renal ortho  Procedures:  none  Antimicrobials:  Anti-infectives (From admission, onward)    Start     Dose/Rate Route Frequency Ordered Stop   03/30/22 2200  fidaxomicin (  DIFICID) tablet 200 mg        200 mg Oral 2 times daily 03/30/22 1759 04/06/22 2159       Subjective: No new complaints Thinks she's been confused for about 1 week  Objective: Vitals:   03/31/22 0800 03/31/22 0845 03/31/22 0915 03/31/22 0925  BP: (!) 106/53  96/84   Pulse: 72 73 72   Resp: 10 17 13     Temp:    97.6 F (36.4 C)  TempSrc:    Oral  SpO2: 98% 100% 98%    No intake or output data in the 24 hours ending 03/31/22 1425 There were no vitals filed for this visit.  Examination:  General exam: Appears calm and comfortable  Respiratory system: unlabored Cardiovascular system: RRR Gastrointestinal system: Abdomen is nondistended, soft and nontender Central nervous system: +asterixis, moving all extremities Extremities: bilateral LE swelling, dressing in place to RLE    Data Reviewed: I have personally reviewed following labs and imaging studies  CBC: Recent Labs  Lab 03/26/22 1620 03/30/22 1018 03/30/22 1720 03/31/22 0414  WBC 7.3 8.4  --  9.8  NEUTROABS  --  6.9  --   --   HGB 9.9* 8.7* 8.9* 7.5*  HCT 32.2* 28.0* 28.8* 24.1*  MCV 84.1 84.3  --  83.7  PLT 133* 116*  --  104*    Basic Metabolic Panel: Recent Labs  Lab 03/26/22 1620 03/30/22 1018 03/31/22 0414  NA 137 136 139  K 3.6 3.4* 3.4*  CL 98 97* 101  CO2 21 21* 19*  GLUCOSE 271* 261* 275*  BUN 169* 177* 180*  CREATININE 6.22* 7.14* 7.09*  CALCIUM 7.2* 6.9* 6.9*    GFR: CrCl cannot be calculated (Unknown ideal weight.).  Liver Function Tests: Recent Labs  Lab 03/26/22 1620  AST 14  ALT 18  BILITOT 0.6  PROT 6.3    CBG: Recent Labs  Lab 03/30/22 2138 03/31/22 0915 03/31/22 1317  GLUCAP 311* 266* 238*     No results found for this or any previous visit (from the past 240 hour(s)).       Radiology Studies: DG Ankle Complete Right  Result Date: 03/30/2022 CLINICAL DATA:  Pain. EXAM: RIGHT ANKLE - COMPLETE 3+ VIEW COMPARISON:  None Available. FINDINGS: No fracture or dislocation. Bones are diffusely demineralized. Distal foot amputation incompletely visualized. IMPRESSION: 1. No acute bony findings. 2. Distal foot amputation incompletely visualized. Electronically Signed   By: Kennith Center M.D.   On: 03/30/2022 11:24   DG Foot Complete Right  Result Date:  03/30/2022 CLINICAL DATA:  Blister on the back of the right heel. EXAM: RIGHT FOOT COMPLETE - 3+ VIEW COMPARISON:  None Available. 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 Great toe amputation at the level of the MTP joint. Bones are diffusely demineralized lateral film shows no bony destruction of the calcaneus to suggest osteomyelitis. 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 No bony destruction in the calcaneus to suggest evidence for osteomyelitis. Electronically Signed   By: Kennith Center M.D.   On: 03/30/2022 11:23        Scheduled Meds:  amiodarone  200 mg Oral Daily   aspirin EC  81 mg Oral Q breakfast   atorvastatin  80 mg Oral Daily   ferrous sulfate  325 mg Oral BID   fidaxomicin  200 mg Oral BID   heparin  5,000  Units Subcutaneous Q12H   insulin pump   Subcutaneous TID WC, HS, 0200   levothyroxine  50 mcg Oral Q0600   linagliptin  5 mg Oral Daily   pregabalin  150 mg Oral BID   ursodiol  300 mg Oral Daily   vortioxetine HBr  20 mg Oral Daily   Continuous Infusions:   LOS: 1 day    Time spent: over 30 min    Lacretia Nicks, MD [facility]   To contact the attending provider between 7A-7P or the covering provider during after hours 7P-7A, please log into the web site [email or web address] and access using universal [place] password for that web site. If you do not have the password, please call the hospital operator.  03/31/2022, 2:25 PM

## 2022-03-31 NOTE — ED Notes (Signed)
Awaiting Lasix gtt from pharmacy. Pharmacist is checking on it and should be dispensed soon.

## 2022-03-31 NOTE — Inpatient Diabetes Management (Signed)
Inpatient Diabetes Program Recommendations  AACE/ADA: New Consensus Statement on Inpatient Glycemic Control (2015)  Target Ranges:  Prepandial:   less than 140 mg/dL      Peak postprandial:   less than 180 mg/dL (1-2 hours)      Critically ill patients:  140 - 180 mg/dL    Latest Reference Range & Units 01/20/22 03:25  Hemoglobin A1C 4.8 - 5.6 % >15.5 (H)  >398 mg/dl  (H): Data is abnormally high  Latest Reference Range & Units 03/30/22 21:38 03/31/22 09:15  Glucose-Capillary 70 - 99 mg/dL 311 (H) 266 (H)  3.85 units per Pt's Insulin Pump  (H): Data is abnormally high  Admit with:  Diarrhea Right leg venous ulcer bleeding  AKI  History: DM, CKD, CHF  Home DM Meds: Freestyle Libre 2 CGM       Levemir 15 units BID       Novolog 0-9 TID per SSI       Insulin Pump Last Admission (Sept 2023)        Current Orders: Novolog Sensitive Correction Scale/ SSI (0-9 units) TID AC     Tradjenta 5 mg daily     Insulin Pump ac/hs/2am  MD- If pt (for any reason), needs to come off her home insulin pump and start SQ Insulin, recommend the following to start:  Semglee 34 units Daily (make sure to give Semglee when pump is removed)  Novolog Novolog Moderate Correction Scale/ SSI (0-15 units) TID AC + HS   Insulin pump settings are:  Omnipod MN-5A- 1.05 units/hr  5A-9A- 1.15 units/hr  9A-7P- 1.9 units/hr  7P-MN- 1.1 unit/hr  Total 24 hour basal is 34.35 units/24 hours Unsure of bolus settings Pt uses Bolus feature with CBG results alone--does not bolus for Carbohydrates   Met w/ pt down in the ED around 11am.  Pt was sleepy but able to hold conversation with me.  Knew her name, DOB, current year, and where she was.  Pt allowed me to look at the PDM for her Omni Pod insulin pump.  See above for current basal settings.  Pt told me she only gives herself insulin boluses for her CBGs--does not bolus for carbohydrates.  Still seeing Dr. [name] for ENDO.  Reviewed with pt that, per her PDM,  her current insulin pod is due to expire at 7:39pm on 11/07--Sooner if she requires higher doses of insulin boluses.  Pt told me she has an extra pod and insulin but that her husband will need to change the pod for her.  Explained to pt that if her husband is not here in the hospital to change her pod tomorrow that the MD will likely take her off her pump and use basal/bolus SQ regimen to manage her CBGs in hospital--pt OK with this plan if needed.  Reviewed current A1c of >15.5% with pt (back in Aug 2023).  Pt stated she knows that A1c is too high and is trying to do better at home with her CBG control.  Discussed with pt that I will ask the MD to re-order new A1c to check and see if it has come down since August.  Also spoke with RN caring for pt and reviewed the same info above with the RN.  Asked RN to please continue to check CBGs with hospital meter and encourage pt to bolus for elevated CBGs with her pump.  Insulin pump orders have been placed.  Sent [place] to Dr. Florene Glen alerting him to the above info as  well.  Novolog SSi was stopped per request.  Alerted MD that I will leave recs for conversion to basal/bolus therapy if needed if pt has to come off her insulin pump for any reason.  MD acknowledged my Porter.      --Will follow patient during hospitalization--  Wyn Quaker RN, MSN, [place] Diabetes Coordinator Inpatient Glycemic Control Team Team Pager: [phone] (8a-5p)

## 2022-03-31 NOTE — ED Notes (Signed)
Ortho at bedside.

## 2022-03-31 NOTE — Consult Note (Addendum)
ORTHOPAEDIC CONSULTATION  REQUESTING PHYSICIAN: Elodia Florence., *  Chief Complaint: Diarrhea and acute bleeding right leg venous stasis ulcer.  HPI: Susan [name] is a 71 y.o. female who presents with chronic venous stasis ulcers both lower extremities.  Patient most recently has undergone Kerecis tissue graft to the left lower extremity.  Patient presents at this time with acute diarrhea bleeding from her venous ulcer right leg on Eliquis for A-fib.  Patient reports an acute history of C. difficile.  Past Medical History:  Diagnosis Date   Acute MI ([place]) 1999; 2007   Anemia    hx   Anginal pain ([place])    Anxiety    ARF (acute renal failure) ([place]) 06/2017   [facility]   Arthritis    "generalized" (03/15/2014)   CAD (coronary artery disease)    MI in 2000 - MI  2007 - treated bare metal stent (no nuclear since then as 9/11)   Carotid artery disease (H[facility])    CHF (congestive heart failure) ([place])    Chronic diastolic heart failure ([place])    a) ECHO (08/2013) EF 55-60% and RV function nl b) RHC (08/2013) RA 4, RV 30/5/7, PA 25/10 (16), PCWP 7, Fick CO/CI 6.3/2.7, PVR 1.5 WU, PA 61 and 66%   Daily headache    "~ every other day; since I fell in June" (03/15/2014)   Depression    Dyslipidemia    Dyspnea    Exertional shortness of breath    HTN (hypertension)    Hypothyroidism    Neuropathy    Obesity    Osteoarthritis    Peripheral neuropathy    PONV (postoperative nausea and vomiting)    RBBB (right bundle branch block)    Old   Stroke ([place])    mini strokes   Syncope    likely due to low blood sugar   Tachycardia    Sinus tachycardia   Type II diabetes mellitus ([place])    Type II   Urinary incontinence    Venous insufficiency    Past Surgical History:  Procedure Laterality Date   ABDOMINAL HYSTERECTOMY  1980's   AMPUTATION Right 02/24/2018   Procedure: RIGHT FOOT GREAT TOE AND 2ND TOE AMPUTATION;  Surgeon: Newt Minion, MD;  Location: [place];  Service:  Orthopedics;  Laterality: Right;   AMPUTATION Right 04/30/2018   Procedure: RIGHT TRANSMETATARSAL AMPUTATION;  Surgeon: Newt Minion, MD;  Location: [place];  Service: Orthopedics;  Laterality: Right;   BIOPSY  05/27/2020   Procedure: BIOPSY;  Surgeon: [name], DO;  [facility];  Service: Endoscopy;;   CATARACT EXTRACTION, BILATERAL Bilateral ?2013   COLONOSCOPY W/ POLYPECTOMY     COLONOSCOPY WITH PROPOFOL N/A 03/13/2019   Procedure: COLONOSCOPY WITH PROPOFOL;  Surgeon: Jerene Bears, MD;  Location: [place];  Service: Gastroenterology;  Laterality: N/A;   CORONARY ANGIOPLASTY WITH STENT PLACEMENT  1999; 2007   "1 + 1"   ERCP N/A 02/03/2022   Procedure: ENDOSCOPIC RETROGRADE CHOLANGIOPANCREATOGRAPHY (ERCP);  Surgeon: Carol Ada, MD;  Location: [place];  Service: Gastroenterology;  Laterality: N/A;   ESOPHAGOGASTRODUODENOSCOPY (EGD) WITH PROPOFOL N/A 03/13/2019   Procedure: ESOPHAGOGASTRODUODENOSCOPY (EGD) WITH PROPOFOL;  Surgeon: Jerene Bears, MD;  Location: [facility];  Service: Gastroenterology;  Laterality: N/A;   ESOPHAGOGASTRODUODENOSCOPY (EGD) WITH PROPOFOL N/A 05/27/2020   Procedure: ESOPHAGOGASTRODUODENOSCOPY (EGD) WITH PROPOFOL;  Surgeon: [name], DO;  [facility];  Service: Endoscopy;  Laterality: N/A;  EYE SURGERY Bilateral    lazer   HEMOSTASIS CLIP PLACEMENT  03/13/2019   Procedure: HEMOSTASIS CLIP PLACEMENT;  Surgeon: Jerene Bears, MD;  Location[facility] ENDOSCOPY;  Service: Gastroenterology;;   KNEE ARTHROSCOPY Left 10/25/2006   POLYPECTOMY  03/13/2019   Procedure: POLYPECTOMY;  Surgeon: Jerene Bears, MD;  Location: [facility];  Service: Gastroenterology;;   REMOVAL OF STONES  02/03/2022   Procedure: REMOVAL OF STONES;  Surgeon: Carol Ada, MD;  Location: [place];  Service: Gastroenterology;;   RIGHT HEART CATH N/A 07/24/2017   Procedure: RIGHT HEART CATH;  Surgeon: Jolaine Artist, MD;  Location: [facility];  Service: Cardiovascular;  Laterality: N/A;   RIGHT HEART CATHETERIZATION N/A 09/22/2013   Procedure: RIGHT HEART CATH;  Surgeon: Jolaine Artist, MD;  Location: [facility] CATH LAB;  Service: Cardiovascular;  Laterality: N/A;   SHOULDER ARTHROSCOPY WITH OPEN ROTATOR CUFF REPAIR Right 03/14/2014   Procedure: RIGHT SHOULDER ARTHROSCOPY WITH BICEPS RELEASE, OPEN SUBSCAPULA REPAIR, OPEN SUPRASPINATUS REPAIR.;  Surgeon: Meredith Pel, MD;  Location: [place];  Service: Orthopedics;  Laterality: Right;   SPHINCTEROTOMY  02/03/2022   Procedure: SPHINCTEROTOMY;  Surgeon: Carol Ada, MD;  Location: [place];  Service: Gastroenterology;;   TEE WITHOUT CARDIOVERSION N/A 02/04/2022   Procedure: TRANSESOPHAGEAL ECHOCARDIOGRAM (TEE);  Surgeon: Jolaine Artist, MD;  Location: [facility] ENDOSCOPY;  Service: Cardiovascular;  Laterality: N/A;   TOE AMPUTATION Right 02/24/2018   GREAT TOE AND 2ND TOE AMPUTATION   TUBAL LIGATION  1970's   Social History   Socioeconomic History   Marital status: Married    Spouse name: Percell Miller   Number of children: 3   Years of education: 12th   Highest education level: Not on file  Occupational History    Employer: UNEMPLOYED  Tobacco Use   Smoking status: Former    Packs/day: 3.00    Years: 32.00    Total pack years: 96.00    Types: Cigarettes    Quit date: 10/24/1997    Years since quitting: 24.4   Smokeless tobacco: Never  Vaping Use   Vaping Use: Never used  Substance and Sexual Activity   Alcohol use: Not Currently    Comment: "might have 2-3 daiquiris in the summer"   Drug use: No   Sexual activity: Not Currently  Other Topics Concern   Not on file  Social History Narrative   Pt lives at home with her spouse.Caffeine Use- 3 sodas daily.   Social Determinants of Health   Financial Resource Strain: Low Risk  (08/16/2021)   Overall Financial Resource Strain (CARDIA)    Difficulty of Paying Living Expenses: Not hard at all  Food Insecurity: No Food  Insecurity (08/16/2021)   Hunger Vital Sign    Worried About Running Out of Food in the Last Year: Never true    Ran Out of Food in the Last Year: Never true  Transportation Needs: No Transportation Needs (08/16/2021)   PRAPARE - Hydrologist (Medical): No    Lack of Transportation (Non-Medical): No  Physical Activity: Inactive (08/16/2021)   Exercise Vital Sign    Days of Exercise per Week: 0 days    Minutes of Exercise per Session: 0 min  Stress: No Stress Concern Present (08/16/2021)   [place]    Feeling of Stress : Not at all  Social Connections: Moderately Isolated (08/16/2021)   Social Connection and Isolation Panel [NHANES]  Frequency of Communication with Friends and Family: More than three times a week    Frequency of Social Gatherings with Friends and Family: More than three times a week    Attends Religious Services: Never    Marine scientist or Organizations: No    Attends Music therapist: Never    Marital Status: Married   Family History  Problem Relation Age of Onset   Heart attack Mother 75   - negative except otherwise stated in the family history section Allergies  Allergen Reactions   Keflex [Cephalexin] Diarrhea   Codeine Nausea And Vomiting and Other (See Comments)   Prior to Admission medications   Medication Sig Start Date End Date Taking? Authorizing Provider  albuterol (VENTOLIN HFA) 108 (90 Base) MCG/ACT inhaler Inhale 1-2 puffs into the lungs every 6 (six) hours as needed for wheezing or shortness of breath.   Yes [provider]  amiodarone (PACERONE) 200 MG tablet Take 1 tablet (200 mg total) by mouth daily. 01/29/22  Yes Milford, Maricela Bo, FNP  apixaban (ELIQUIS) 5 MG TABS tablet Take 1 tablet (5 mg total) by mouth 2 (two) times daily. 01/29/22  Yes Milford, Maricela Bo, FNP  aspirin EC 81 MG tablet Take 1 tablet (81 mg total) by  mouth daily with breakfast. 05/28/20  Yes Emokpae, Courage, MD  atorvastatin (LIPITOR) 80 MG tablet TAKE 1 TABLET BY MOUTH EVERY DAY 03/03/22  Yes Milford, Maricela Bo, FNP  carvedilol (COREG) 6.25 MG tablet Take 1 tablet (6.25 mg total) by mouth 2 (two) times daily with a meal. 01/29/22  Yes Milford, [place], FNP  colchicine 0.6 MG tablet Take 0.6 mg by mouth daily as needed (gout).   Yes [provider]  diphenoxylate-atropine (LOMOTIL) 2.5-0.025 MG tablet Take 1 tablet by mouth 4 (four) times daily as needed for diarrhea or loose stools. 08/19/21  Yes [name], MD  ferrous sulfate 325 (65 FE) MG tablet Take 325 mg by mouth 2 (two) times daily.   Yes [provider]  fidaxomicin (DIFICID) 200 MG TABS tablet Take 1 tablet (200 mg total) by mouth 2 (two) times daily. 03/28/22  Yes Rosiland Oz, MD  isosorbide mononitrate (IMDUR) 30 MG 24 hr tablet Take 0.5 tablets (15 mg total) by mouth daily. 12/10/21  Yes Milford, Maricela Bo, FNP  levothyroxine (SYNTHROID, LEVOTHROID) 50 MCG tablet Take 1 tablet (50 mcg total) by mouth daily before breakfast. 11/07/16  Yes Dena Billet B, PA-C  magnesium oxide (MAG-OX) 400 (240 Mg) MG tablet Take 1 tablet (400 mg total) by mouth daily. 07/18/21  Yes [name], MD  metolazone (ZAROXOLYN) 2.5 MG tablet Take 1 tablet (2.5 mg total) by mouth daily for 2 days, THEN 1 tablet (2.5 mg total) once a week. Every Tuesday with an additional 20 meq of potassium. 02/25/22 03/30/22 Yes Milford, Maricela Bo, FNP  nitrofurantoin, macrocrystal-monohydrate, (MACROBID) 100 MG capsule Take 1 capsule (100 mg total) by mouth 2 (two) times daily. 03/21/22  Yes [name], MD  nitroGLYCERIN (NITROSTAT) 0.4 MG SL tablet PLACE 1 TABLET (0.4 MG TOTAL) UNDER THE TONGUE EVERY 5 (FIVE) MINUTES AS NEEDED FOR CHEST PAIN. 02/06/21  Yes Larey Dresser, MD  OXYGEN Inhale 2 L/min into the lungs at bedtime.   Yes [provider]  polyethylene glycol (MIRALAX /  GLYCOLAX) 17 g packet Take 17 g by mouth as needed for mild constipation.   Yes [provider]  pregabalin (LYRICA) 150 MG capsule  Take 150 mg by mouth 2 (two) times daily. 03/20/21  Yes [provider]  senna-docusate (SENOKOT-S) 8.6-50 MG tablet Take 1 tablet by mouth 2 (two) times daily. Patient taking differently: Take 1 tablet by mouth daily as needed for mild constipation or moderate constipation. 02/11/22  Yes Eugenie Filler, MD  torsemide (DEMADEX) 20 MG tablet Take 4 tablets (80 mg total) by mouth 2 (two) times daily. 03/06/22  Yes Bensimhon, Shaune Pascal, MD  ursodiol (ACTIGALL) 500 MG tablet Take 500 mg by mouth 3 (three) times daily. 12/11/20  Yes [provider]  blood glucose meter kit and supplies Use up to four times daily as directed. 01/23/22   Ghimire, Henreitta Leber, MD  Continuous Blood Gluc Sensor (FREESTYLE LIBRE 2 SENSOR) MISC  01/14/22   [provider]  Fingerstix Lancets MISC Use as directed to check blood sugars as need up to 4 times daily 01/23/22   Ghimire, Henreitta Leber, MD  glucose blood test strip use as directed to check blood sugars up to 4 times daily 01/23/22   Jonetta Osgood, MD  Insulin Pen Needle 32G X 4 MM MISC Use as directed 01/23/22   Jonetta Osgood, MD  vortioxetine HBr (TRINTELLIX) 10 MG TABS tablet Take 2 tablets (20 mg total) by mouth daily. Patient not taking: Reported on 03/30/2022 02/28/22   [name], MD   DG Ankle Complete Right  Result Date: 03/30/2022 CLINICAL DATA:  Pain. EXAM: RIGHT ANKLE - COMPLETE 3+ VIEW COMPARISON:  None Available. FINDINGS: No fracture or dislocation. Bones are diffusely demineralized. Distal foot amputation incompletely visualized. IMPRESSION: 1. No acute bony findings. 2. Distal foot amputation incompletely visualized. Electronically Signed   By: Misty Stanley M.D.   On: 03/30/2022 11:24   DG Foot Complete Right  Result Date: 03/30/2022 CLINICAL DATA:  Blister on the back of the  right heel. EXAM: RIGHT FOOT COMPLETE - 3+ VIEW COMPARISON:  None Available. 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 Great toe amputation at the level of the MTP joint. Bones are diffusely demineralized lateral film shows no bony destruction of the calcaneus to suggest osteomyelitis. 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 No bony destruction in the calcaneus to suggest evidence for osteomyelitis. Electronically Signed   By: Misty Stanley M.D.   On: 03/30/2022 11:23   - pertinent xrays, CT, MRI studies were reviewed and independently interpreted  Positive ROS: All other systems have been reviewed and were otherwise negative with the exception of those mentioned in the HPI and as above.  Physical Exam: General: Alert, no acute distress Psychiatric: Patient is competent for consent with normal mood and affect Lymphatic: No axillary or cervical lymphadenopathy Cardiovascular: No pedal edema Respiratory: No cyanosis, no use of accessory musculature GI: No organomegaly, abdomen is soft and non-tender    Images:  _0 @  Labs:  Lab Results  Component Value Date   HGBA1C >15.5 (H) 01/20/2022   HGBA1C >15.5 (H) 01/19/2022   HGBA1C >15.5 (H) 01/14/2022   CRP 0.7 08/22/2020   CRP 1.5 (H) 03/13/2020   CRP 1.9 (H) 03/12/2020   LABURIC 4.7 08/09/2020   LABURIC 9.1 (H) 10/12/2018   LABURIC 9.0 (H) 03/06/2018   REPTSTATUS 02/08/2022 FINAL 02/03/2022   CULT  02/03/2022    NO GROWTH 5 DAYS Performed at [facility], [place] [address] [place], [place] [address]  LABORGA ENTEROCOCCUS FAECIUM 02/01/2022   LABORGA ENTEROCOCCUS FAECALIS 02/01/2022    Lab Results  Component Value Date   ALBUMIN 2.8 (L) 02/11/2022   ALBUMIN 2.6 (L) 02/10/2022   ALBUMIN 2.3 (L) 02/04/2022   LABURIC 4.7 08/09/2020   LABURIC 9.1 (H) 10/12/2018   LABURIC 9.0 (H) 03/06/2018         Latest Ref Rng & Units 03/31/2022    4:14 AM 03/30/2022    5:20 PM 03/30/2022   10:18 AM  CBC EXTENDED  WBC 4.0 - 10.5 K/uL 9.8   8.4   RBC 3.87 - 5.11 MIL/uL 2.88   3.32   Hemoglobin 12.0 - 15.0 g/dL 7.5  8.9  8.7   HCT 36.0 - 46.0 % 24.1  28.8  28.0   Platelets 150 - 400 K/uL 104   116   NEUT# 1.7 - 7.7 K/uL   6.9   Lymph# 0.7 - 4.0 K/uL   0.3     Neurologic: Patient does not have protective sensation bilateral lower extremities.   MUSCULOSKELETAL:   Skin: Examination patient has a decubitus ulcer of the right heel.  This does not probe to bone.  The venous stasis ulceration in the right leg is stable without bleeding.  The ulcerations in the left leg are also stable without bleeding.  Patient's recent hemoglobin is 7.5 with a baseline around 9.  White cell count 9.8.  Absolute neutrophil count 6.9 with a absolute lymphocyte count of 0.3.  Albumin 2.8.  Hemoglobin A1c consistently greater than 15.5.  Ankle-brachial indices showed biphasic flow.  Toe pressures diminished.  Assessment: Assessment: Uncontrolled type 2 diabetes with venous insufficiency with bleeding from the varicose veins secondary to anticoagulation therapy for her A-fib.  Plan: Will have both legs wrapped with an [place]na compression wrap and a PRAFO boot for the right.  I will follow-up in the office in 1 week.  Thank you for the consult and the opportunity to see Ms. Fabian November, MD [facility] [phone] 7:39 AM

## 2022-03-31 NOTE — Progress Notes (Signed)
Orthopedic Tech Progress Note Patient Details:  Susan [name] 1951-02-02 [id number]  Ortho Devices Type of Ortho Device: Haematologist Ortho Device/Splint Location: BLE Ortho Device/Splint Interventions: Ordered, Application   Post Interventions Patient Tolerated: Well  Damien Cisar A [name] 03/31/2022, 7:25 PM

## 2022-03-31 NOTE — ED Notes (Signed)
Spoke with ortho tech about orders.

## 2022-03-31 NOTE — ED Notes (Signed)
Patient used her insulin pump and gave herself 3.85 units of insulin for the CBG of 266.

## 2022-03-31 NOTE — Consult Note (Addendum)
Reason for Consult: Renal failure Referring Physician:  Dr. Wynetta Fines  Chief Complaint: Right shin bleeding  Assessment/Plan: Acute kidney injury likely secondary to cardiorenal on advanced CKD IV/V with a baseline creatinine in the 3.5-4.2 range in 01/2022 but has been steadily worsening and in the 4.8-5.1 range in 02/2022. She either has progressive CKD but hoping there is a significant component of cardiorenal. Fortunately no absolute indications for dialysis at this time but certainly possible she may need it this hospitalization. She was tearful when I explained this but expressed understanding. - Will check a renal ultrasound for cortical thickness, size of kidney and r/o obstructive uropathy (low on the differential). - Will challenge with IV Lasix 174m IV q8hr and initially recommended advanced HF team see the pt to evaluate whether inotropic therapy would be of benefit. But d/w Dr. MAundra Dubinand he does not believe inotropes will help + pt not a candidate for home inotropes either. I d/w the patient having to initiate HD and she was tearful but understood. - Appreciate VVS agreeing to see the pt for permanent access + TC to initiate dialysis. Will initiate dialysis when the catheter is in.  - 2gm Na diet and fluid restrict to 1L per day -Monitor Daily I/Os, Daily weight  -Maintain MAP>65 for optimal renal perfusion.  - Avoid nephrotoxic agents such as IV contrast, NSAIDs, and phosphate containing bowel preps (FLEETS)   HFrEF with EF 30% in the setting of severe TR more refractory to diuretics at this time. Renal osteodystrophy - will check a iPTH, phos as well Anemia - check iron panel, transfuse as needed. 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 Severe TR DM  PAF on Eliquis   HPI: SSashay Fellingis an 71y.o. 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 UHollie Salkwith [facility].  She has issues with fluid and salt restriction in the setting of CHF also followed by Dr. BHaroldine Laws  She has pretty significant swings in volume status.  Torsemide dose is quite high at 886mBID. rent UTIs.  She has had positive urine culture with Enterobacter aerogenes S to quinolones, gentamycin, tobramycin, and bactrim.  She denies f/c, n/v, SOB, CP but has noted decreased UOP for weeks now. She is here with bleeding from a wound on the right found noted to be tearing of the skin.  She had UNNA boots but the right one was removed in the ED because of the wound.   Of note she has had ERCP 02/03/22 for gallstone pancreatitis w/ ID consulted and change from vanc--> changed to linezolid PO. She states that her appetite is fine with no nausea, vomiting and denies SOB or dysuria.  ROS Pertinent items are noted in HPI.  Chemistry and CBC: Creat  Date/Time Value Ref Range Status  03/26/2022 04:20 PM 6.22 (H) 0.60 - 1.00 mg/dL Final  03/06/2022 03:23 PM 5.05 (H) 0.60 - 1.00 mg/dL Final    Comment:    Verified by repeat analysis. .   03/05/2022 03:55 PM 4.96 (H) 0.60 - 1.00 mg/dL Final    Comment:    Verified by repeat analysis. . Marland Kitchen 02/18/2022 12:28 PM 4.87 (H) 0.60 - 1.00 mg/dL Final  02/17/2022 02:17 PM 5.04 (H)  0.60 - 1.00 mg/dL Final  08/05/2021 04:48 PM 3.08 (H) 0.60 - 1.00 mg/dL Final  07/31/2021 04:54 PM 3.85 (H) 0.60 - 1.00 mg/dL Final  07/11/2021 02:29 PM 2.87 (H) 0.60 - 1.00 mg/dL Final  04/04/2021 02:37 PM 3.10 (H) 0.60 - 1.00 mg/dL Final  03/15/2021 02:30 PM 3.22 (H) 0.60 - 1.00 mg/dL Final  11/15/2020 02:52 PM 3.85 (H) 0.60 - 0.93 mg/dL Final    Comment:    For patients >10 years of age, the reference limit for Creatinine is approximately 13% higher for people identified as African-American. .   10/08/2020 04:10 PM 3.36 (H) 0.60 - 0.93  mg/dL Final    Comment:    For patients >84 years of age, the reference limit for Creatinine is approximately 13% higher for people identified as African-American. Marland Kitchen   10/01/2020 12:40 PM 3.75 (H) 0.60 - 0.93 mg/dL Final    Comment:    For patients >82 years of age, the reference limit for Creatinine is approximately 13% higher for people identified as African-American. Marland Kitchen   09/27/2020 10:20 AM 3.03 (H) 0.60 - 0.93 mg/dL Final    Comment:    For patients >19 years of age, the reference limit for Creatinine is approximately 13% higher for people identified as African-American. .   09/24/2020 01:10 PM 3.75 (H) 0.60 - 0.93 mg/dL Final    Comment:    For patients >90 years of age, the reference limit for Creatinine is approximately 13% higher for people identified as African-American. Marland Kitchen   09/18/2020 09:32 AM 3.11 (H) 0.60 - 0.93 mg/dL Final    Comment:    For patients >46 years of age, the reference limit for Creatinine is approximately 13% higher for people identified as African-American. .   09/10/2020 12:55 PM 2.14 (H) 0.60 - 0.93 mg/dL Final    Comment:    For patients >16 years of age, the reference limit for Creatinine is approximately 13% higher for people identified as African-American. Marland Kitchen   08/20/2020 03:27 PM 2.57 (H) 0.60 - 0.93 mg/dL Final    Comment:    For patients >2 years of age, the reference limit for Creatinine is approximately 13% higher for people identified as African-American. Marland Kitchen   08/13/2020 12:42 PM 4.17 (H) 0.60 - 0.93 mg/dL Final    Comment:    For patients >16 years of age, the reference limit for Creatinine is approximately 13% higher for people identified as African-American. Marland Kitchen   07/05/2020 12:47 PM 2.39 (H) 0.60 - 0.93 mg/dL Final    Comment:    For patients >56 years of age, the reference limit for Creatinine is approximately 13% higher for people identified as African-American. .   04/04/2020 02:34 PM 2.21 (H) 0.60 - 0.93 mg/dL  Final    Comment:    For patients >30 years of age, the reference limit for Creatinine is approximately 13% higher for people identified as African-American. Marland Kitchen   09/26/2019 02:28 PM 1.91 (H) 0.60 - 0.93 mg/dL Final    Comment:    For patients >81 years of age, the reference limit for Creatinine is approximately 13% higher for people identified as African-American. .   09/05/2019 10:24 AM 2.17 (H) 0.60 - 0.93 mg/dL Final    Comment:    For patients >28 years of age, the reference limit for Creatinine is approximately 13% higher for people identified as African-American. Marland Kitchen   09/02/2019 11:37 AM 3.00 (H) 0.60 - 0.93 mg/dL Final    Comment:  For patients >55 years of age, the reference limit for Creatinine is approximately 13% higher for people identified as African-American. .   06/07/2019 04:27 PM 1.65 (H) 0.60 - 0.93 mg/dL Final    Comment:    For patients >54 years of age, the reference limit for Creatinine is approximately 13% higher for people identified as African-American. .   05/23/2019 02:48 PM 1.95 (H) 0.50 - 0.99 mg/dL Final    Comment:    For patients >39 years of age, the reference limit for Creatinine is approximately 13% higher for people identified as African-American. .   03/16/2019 11:43 AM 2.12 (H) 0.50 - 0.99 mg/dL Final    Comment:    For patients >43 years of age, the reference limit for Creatinine is approximately 13% higher for people identified as African-American. Marland Kitchen   01/17/2019 04:08 PM 1.88 (H) 0.50 - 0.99 mg/dL Final    Comment:    For patients >47 years of age, the reference limit for Creatinine is approximately 13% higher for people identified as African-American. .   04/26/2018 02:49 PM 2.18 (H) 0.50 - 0.99 mg/dL Final    Comment:    For patients >48 years of age, the reference limit for Creatinine is approximately 13% higher for people identified as African-American. Marland Kitchen   01/28/2018 12:13 PM 2.10 (H) 0.50 - 0.99 mg/dL Final     Comment:    For patients >21 years of age, the reference limit for Creatinine is approximately 13% higher for people identified as African-American. Marland Kitchen   01/22/2018 11:01 AM 1.63 (H) 0.50 - 0.99 mg/dL Final    Comment:    For patients >47 years of age, the reference limit for Creatinine is approximately 13% higher for people identified as African-American. Marland Kitchen   01/15/2018 11:08 AM 1.83 (H) 0.50 - 0.99 mg/dL Final    Comment:    For patients >85 years of age, the reference limit for Creatinine is approximately 13% higher for people identified as African-American. Marland Kitchen   01/13/2018 11:29 AM 1.89 (H) 0.50 - 0.99 mg/dL Final    Comment:    For patients >18 years of age, the reference limit for Creatinine is approximately 13% higher for people identified as African-American. Marland Kitchen   10/26/2017 01:07 PM 1.51 (H) 0.50 - 0.99 mg/dL Final    Comment:    For patients >78 years of age, the reference limit for Creatinine is approximately 13% higher for people identified as African-American. Marland Kitchen   09/29/2017 02:16 PM 1.72 (H) 0.50 - 0.99 mg/dL Final    Comment:    For patients >41 years of age, the reference limit for Creatinine is approximately 13% higher for people identified as African-American. Marland Kitchen   09/25/2017 12:37 PM 1.57 (H) 0.50 - 0.99 mg/dL Final    Comment:    For patients >46 years of age, the reference limit for Creatinine is approximately 13% higher for people identified as African-American. .   02/27/2017 03:58 PM 2.11 (H) 0.50 - 0.99 mg/dL Final    Comment:    For patients >87 years of age, the reference limit for Creatinine is approximately 13% higher for people identified as African-American. .   11/14/2016 09:25 AM 1.76 (H) 0.50 - 0.99 mg/dL Final    Comment:      For patients > or = 72 years of age: The upper reference limit for Creatinine is approximately 13% higher for people identified as African-American.     08/06/2016 03:42 PM 2.12 (H) 0.50 - 0.99  mg/dL Final    Comment:      For patients > or = 72 years of age: The upper reference limit for Creatinine is approximately 13% higher for people identified as African-American.     10/03/2015 04:57 PM 1.41 (H) 0.50 - 0.99 mg/dL Final  06/19/2015 12:42 PM 1.29 (H) 0.50 - 0.99 mg/dL Final  06/14/2015 02:32 PM 1.69 (H) 0.50 - 0.99 mg/dL Final  03/26/2015 11:46 AM 1.41 (H) 0.50 - 0.99 mg/dL Final  03/16/2015 12:32 PM 1.07 (H) 0.50 - 0.99 mg/dL Final  03/08/2015 12:35 PM 1.37 (H) 0.50 - 0.99 mg/dL Final  04/18/2014 04:29 PM 1.18 (H) 0.50 - 1.10 mg/dL Final  05/06/2013 04:36 PM 1.24 (H) 0.50 - 1.10 mg/dL Final  12/29/2012 02:19 PM 0.84 0.50 - 1.10 mg/dL Final  12/22/2012 03:05 PM 1.86 (H) 0.50 - 1.10 mg/dL Final   Creatinine, Ser  Date/Time Value Ref Range Status  03/31/2022 04:14 AM 7.09 (H) 0.44 - 1.00 mg/dL Final  03/30/2022 10:18 AM 7.14 (H) 0.44 - 1.00 mg/dL Final  02/25/2022 10:51 AM 4.81 (H) 0.44 - 1.00 mg/dL Final  02/11/2022 06:40 AM 2.92 (H) 0.44 - 1.00 mg/dL Final  02/10/2022 10:16 AM 3.19 (H) 0.44 - 1.00 mg/dL Final  02/09/2022 03:55 AM 2.94 (H) 0.44 - 1.00 mg/dL Final  02/08/2022 04:38 AM 3.06 (H) 0.44 - 1.00 mg/dL Final  02/07/2022 08:13 AM 3.12 (H) 0.44 - 1.00 mg/dL Final  02/06/2022 05:43 AM 3.29 (H) 0.44 - 1.00 mg/dL Final  02/05/2022 06:00 AM 3.19 (H) 0.44 - 1.00 mg/dL Final  02/04/2022 05:42 AM 3.44 (H) 0.44 - 1.00 mg/dL Final  02/03/2022 06:00 AM 3.71 (H) 0.44 - 1.00 mg/dL Final  02/02/2022 01:21 AM 4.01 (H) 0.44 - 1.00 mg/dL Final  02/01/2022 01:14 PM 4.03 (H) 0.44 - 1.00 mg/dL Final  02/01/2022 06:15 AM 3.93 (H) 0.44 - 1.00 mg/dL Final  01/31/2022 10:42 PM 3.69 (H) 0.44 - 1.00 mg/dL Final  01/29/2022 11:47 AM 3.08 (H) 0.44 - 1.00 mg/dL Final  01/23/2022 04:40 AM 3.37 (H) 0.44 - 1.00 mg/dL Final  01/22/2022 09:19 AM 3.40 (H) 0.44 - 1.00 mg/dL Final  01/21/2022 03:36 AM 3.39 (H) 0.44 - 1.00 mg/dL Final  01/20/2022 03:25 AM 3.38 (H) 0.44 - 1.00 mg/dL Final   01/19/2022 06:50 AM 3.29 (H) 0.44 - 1.00 mg/dL Final  01/18/2022 01:34 PM 3.30 (H) 0.44 - 1.00 mg/dL Final  01/17/2022 01:08 AM 3.68 (H) 0.44 - 1.00 mg/dL Final  01/16/2022 03:59 PM 3.92 (H) 0.44 - 1.00 mg/dL Final  01/16/2022 01:45 PM 3.66 (H) 0.44 - 1.00 mg/dL Final  01/16/2022 05:52 AM 4.25 (H) 0.44 - 1.00 mg/dL Final  01/16/2022 01:52 AM 4.27 (H) 0.44 - 1.00 mg/dL Final  01/15/2022 09:14 PM 4.42 (H) 0.44 - 1.00 mg/dL Final  01/15/2022 01:22 PM 4.66 (H) 0.44 - 1.00 mg/dL Final  01/15/2022 01:22 PM 4.21 (H) 0.44 - 1.00 mg/dL Final  01/15/2022 09:20 AM 5.14 (H) 0.44 - 1.00 mg/dL Final  01/15/2022 04:49 AM 5.20 (H) 0.44 - 1.00 mg/dL Final  01/15/2022 01:35 AM 5.15 (H) 0.44 - 1.00 mg/dL Final  01/14/2022 10:45 PM 5.29 (H) 0.44 - 1.00 mg/dL Final  01/14/2022 04:54 PM 4.50 (H) 0.44 - 1.00 mg/dL Final  01/14/2022 04:50 PM 5.09 (H) 0.44 - 1.00 mg/dL Final  12/24/2021 02:21 PM 3.33 (H) 0.44 - 1.00 mg/dL Final  05/16/2021 02:24 PM 3.42 (H) 0.44 - 1.00 mg/dL Final  05/06/2021 02:42 PM 4.32 (H) 0.44 - 1.00 mg/dL Final  04/02/2021  03:10 PM 2.88 (H) 0.44 - 1.00 mg/dL Final  03/10/2021 02:18 AM 3.25 (H) 0.44 - 1.00 mg/dL Final  03/09/2021 04:02 AM 3.59 (H) 0.44 - 1.00 mg/dL Final  03/08/2021 01:29 AM 3.40 (H) 0.44 - 1.00 mg/dL Final  03/07/2021 03:26 AM 3.25 (H) 0.44 - 1.00 mg/dL Final  03/06/2021 06:08 PM 3.38 (H) 0.44 - 1.00 mg/dL Final  03/06/2021 05:00 AM 3.77 (H) 0.44 - 1.00 mg/dL Final  03/05/2021 12:36 PM 4.01 (H) 0.44 - 1.00 mg/dL Final  02/01/2021 03:31 PM 3.54 (H) 0.44 - 1.00 mg/dL Final  11/29/2020 03:57 PM 3.65 (H) 0.44 - 1.00 mg/dL Final  11/08/2020 02:33 PM 3.52 (H) 0.44 - 1.00 mg/dL Final  11/01/2020 03:10 PM 3.82 (H) 0.44 - 1.00 mg/dL Final   Recent Labs  Lab 03/26/22 1620 03/30/22 1018 03/31/22 0414  NA 137 136 139  K 3.6 3.4* 3.4*  CL 98 97* 101  CO2 21 21* 19*  GLUCOSE 271* 261* 275*  BUN 169* 177* 180*  CREATININE 6.22* 7.14* 7.09*  CALCIUM 7.2* 6.9* 6.9*    Recent Labs  Lab 03/26/22 1620 03/30/22 1018 03/30/22 1720 03/31/22 0414  WBC 7.3 8.4  --  9.8  NEUTROABS  --  6.9  --   --   HGB 9.9* 8.7* 8.9* 7.5*  HCT 32.2* 28.0* 28.8* 24.1*  MCV 84.1 84.3  --  83.7  PLT 133* 116*  --  104*   Liver Function Tests: Recent Labs  Lab 03/26/22 1620  AST 14  ALT 18  BILITOT 0.6  PROT 6.3   No results for input(s): "LIPASE", "AMYLASE" in the last 168 hours. No results for input(s): "AMMONIA" in the last 168 hours. Cardiac Enzymes: No results for input(s): "CKTOTAL", "CKMB", "CKMBINDEX", "TROPONINI" in the last 168 hours. Iron Studies: No results for input(s): "IRON", "TIBC", "TRANSFERRIN", "FERRITIN" in the last 72 hours. PT/INR: _0 (inr:5)  Xrays/Other Studies: ) Results for orders placed or performed during the hospital encounter of 03/30/22 (from the past 48 hour(s))  Lactic acid, plasma     Status: None   Collection Time: 03/30/22 10:08 AM  Result Value Ref Range   Lactic Acid, Venous 1.1 0.5 - 1.9 mmol/L    Comment: Performed at [facility], [address], [place], [place] [address]  CBC with Differential     Status: Abnormal   Collection Time: 03/30/22 10:18 AM  Result Value Ref Range   WBC 8.4 4.0 - 10.5 K/uL   RBC 3.32 (L) 3.87 - 5.11 MIL/uL   Hemoglobin 8.7 (L) 12.0 - 15.0 g/dL   HCT 28.0 (L) 36.0 - 46.0 %   MCV 84.3 80.0 - 100.0 fL   MCH 26.2 26.0 - 34.0 pg   MCHC 31.1 30.0 - 36.0 g/dL   RDW 17.1 (H) 11.5 - 15.5 %   Platelets 116 (L) 150 - 400 K/uL    Comment: REPEATED TO VERIFY   nRBC 0.0 0.0 - 0.2 %   Neutrophils Relative % 82 %   Neutro Abs 6.9 1.7 - 7.7 K/uL   Lymphocytes Relative 4 %   Lymphs Abs 0.3 (L) 0.7 - 4.0 K/uL   Monocytes Relative 9 %   Monocytes Absolute 0.7 0.1 - 1.0 K/uL   Eosinophils Relative 4 %   Eosinophils Absolute 0.3 0.0 - 0.5 K/uL   Basophils Relative 0 %   Basophils Absolute 0.0 0.0 - 0.1 K/uL   Immature Granulocytes 1 %   Abs Immature Granulocytes 0.05 0.00 - 0.07  K/uL    Comment: Performed at [facility], [place] [address] [place], [place] [address]  Basic metabolic panel     Status: Abnormal   Collection Time: 03/30/22 10:18 AM  Result Value Ref Range   Sodium 136 135 - 145 mmol/L   Potassium 3.4 (L) 3.5 - 5.1 mmol/L   Chloride 97 (L) 98 - 111 mmol/L   CO2 21 (L) 22 - 32 mmol/L   Glucose, Bld 261 (H) 70 - 99 mg/dL    Comment: Glucose reference range applies only to samples taken after fasting for at least 8 hours.   BUN 177 (H) 8 - 23 mg/dL   Creatinine, Ser 7.14 (H) 0.44 - 1.00 mg/dL   Calcium 6.9 (L) 8.9 - 10.3 mg/dL   GFR, Estimated 6 (L) >60 mL/min    Comment: (NOTE) Calculated using the CKD-EPI Creatinine Equation (2021)    Anion gap 18 (H) 5 - 15    Comment: Performed at [facility] [address], [place], [place] [address]  Lactic acid, plasma     Status: None   Collection Time: 03/30/22  5:20 PM  Result Value Ref Range   Lactic Acid, Venous 1.1 0.5 - 1.9 mmol/L    Comment: Performed at [facility] [address] [place], [place] [address]  Hemoglobin and hematocrit, blood     Status: Abnormal   Collection Time: 03/30/22  5:20 PM  Result Value Ref Range   Hemoglobin 8.9 (L) 12.0 - 15.0 g/dL   HCT 28.8 (L) 36.0 - 46.0 %    Comment: Performed at [facility] [address], [place], [place] [address]  CBG monitoring, ED     Status: Abnormal   Collection Time: 03/30/22  9:38 PM  Result Value Ref Range   Glucose-Capillary 311 (H) 70 - 99 mg/dL    Comment: Glucose reference range applies only to samples taken after fasting for at least 8 hours.  CBC     Status: Abnormal   Collection Time: 03/31/22  4:14 AM  Result Value Ref Range   WBC 9.8 4.0 - 10.5 K/uL   RBC 2.88 (L) 3.87 - 5.11 MIL/uL   Hemoglobin 7.5 (L) 12.0 - 15.0 g/dL   HCT 24.1 (L) 36.0 - 46.0 %   MCV 83.7 80.0 - 100.0 fL   MCH 26.0 26.0 - 34.0 pg   MCHC 31.1 30.0 - 36.0 g/dL   RDW 17.2 (H) 11.5 - 15.5 %   Platelets 104 (L) 150 - 400  K/uL    Comment: REPEATED TO VERIFY   nRBC 0.0 0.0 - 0.2 %    Comment: Performed at [facility], [place] [address] [place], [place] [address]  Basic metabolic panel     Status: Abnormal   Collection Time: 03/31/22  4:14 AM  Result Value Ref Range   Sodium 139 135 - 145 mmol/L   Potassium 3.4 (L) 3.5 - 5.1 mmol/L   Chloride 101 98 - 111 mmol/L   CO2 19 (L) 22 - 32 mmol/L   Glucose, Bld 275 (H) 70 - 99 mg/dL    Comment: Glucose reference range applies only to samples taken after fasting for at least 8 hours.   BUN 180 (H) 8 - 23 mg/dL   Creatinine, Ser 7.09 (H) 0.44 - 1.00 mg/dL   Calcium 6.9 (L) 8.9 - 10.3 mg/dL   GFR, Estimated 6 (L) >60 mL/min    Comment: (NOTE) Calculated using the  CKD-EPI Creatinine Equation (2021)    Anion gap 19 (H) 5 - 15    Comment: Performed at [facility], [place] [address], [place], [place] [address]  CBG monitoring, ED     Status: Abnormal   Collection Time: 03/31/22  9:15 AM  Result Value Ref Range   Glucose-Capillary 266 (H) 70 - 99 mg/dL    Comment: Glucose reference range applies only to samples taken after fasting for at least 8 hours.  CBG monitoring, ED     Status: Abnormal   Collection Time: 03/31/22  1:17 PM  Result Value Ref Range   Glucose-Capillary 238 (H) 70 - 99 mg/dL    Comment: Glucose reference range applies only to samples taken after fasting for at least 8 hours.   *Note: Due to a large number of results and/or encounters for the requested time period, some results have not been displayed. A complete set of results can be found in Results Review.   DG Ankle Complete Right  Result Date: 03/30/2022 CLINICAL DATA:  Pain. EXAM: RIGHT ANKLE - COMPLETE 3+ VIEW COMPARISON:  None Available. FINDINGS: No fracture or dislocation. Bones are diffusely demineralized. Distal foot amputation incompletely visualized. IMPRESSION: 1. No acute bony findings. 2. Distal foot amputation incompletely visualized. Electronically Signed   By: Misty Stanley M.D.   On: 03/30/2022 11:24   DG Foot Complete Right  Result Date: 03/30/2022 CLINICAL DATA:  Blister on the back of the right heel. EXAM: RIGHT FOOT COMPLETE - 3+ VIEW COMPARISON:  None Available. 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 Great toe amputation at the level of the MTP joint. Bones are diffusely demineralized lateral film shows no bony destruction of the calcaneus to suggest osteomyelitis. 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 No bony destruction in the calcaneus to suggest evidence for osteomyelitis. Electronically Signed   By: Misty Stanley M.D.   On: 03/30/2022 11:23    PMH:   Past Medical History:  Diagnosis Date   Acute MI ([place]) 1999; 2007   Anemia    hx   Anginal pain ([place])    Anxiety    ARF (acute renal failure) ([place]) 06/2017   [facility]   Arthritis    "generalized" (03/15/2014)   CAD (coronary artery disease)    MI in 2000 - MI  2007 - treated bare metal stent (no nuclear since then as 9/11)   Carotid artery disease ([facility])    CHF (congestive heart failure) ([place])    Chronic diastolic heart failure ([place])    a) ECHO (08/2013) EF 55-60% and RV function nl b) RHC (08/2013) RA 4, RV 30/5/7, PA 25/10 (16), PCWP 7, Fick CO/CI 6.3/2.7, PVR 1.5 WU, PA 61 and 66%   Daily headache    "~ every other day; since I fell in June" (03/15/2014)   Depression    Dyslipidemia    Dyspnea    Exertional shortness of breath    HTN (hypertension)    Hypothyroidism    Neuropathy    Obesity    Osteoarthritis    Peripheral neuropathy    PONV (postoperative nausea and vomiting)    RBBB (right bundle branch block)    Old   Stroke ([place])    mini strokes   Syncope    likely due to low blood sugar   Tachycardia    Sinus tachycardia  Type II diabetes mellitus (H[facility])    Type II   Urinary incontinence    Venous insufficiency     PSH:    Past Surgical History:  Procedure Laterality Date   ABDOMINAL HYSTERECTOMY  1980's   AMPUTATION Right 02/24/2018   Procedure: RIGHT FOOT GREAT TOE AND 2ND TOE AMPUTATION;  Surgeon: Newt Minion, MD;  Location: [facility];  Service: Orthopedics;  Laterality: Right;   AMPUTATION Right 04/30/2018   Procedure: RIGHT TRANSMETATARSAL AMPUTATION;  Surgeon: Newt Minion, MD;  Location: [facility];  Service: Orthopedics;  Laterality: Right;   BIOPSY  05/27/2020   Procedure: BIOPSY;  Surgeon: [name], DO;  [facility];  Service: Endoscopy;;   CATARACT EXTRACTION, BILATERAL Bilateral ?2013   COLONOSCOPY W/ POLYPECTOMY     COLONOSCOPY WITH PROPOFOL N/A 03/13/2019   Procedure: COLONOSCOPY WITH PROPOFOL;  Surgeon: Jerene Bears, MD;  Location: [facility];  Service: Gastroenterology;  Laterality: N/A;   CORONARY ANGIOPLASTY WITH STENT PLACEMENT  1999; 2007   "1 + 1"   ERCP N/A 02/03/2022   Procedure: ENDOSCOPIC RETROGRADE CHOLANGIOPANCREATOGRAPHY (ERCP);  Surgeon: Carol Ada, MD;  Location: [facility];  Service: Gastroenterology;  Laterality: N/A;   ESOPHAGOGASTRODUODENOSCOPY (EGD) WITH PROPOFOL N/A 03/13/2019   Procedure: ESOPHAGOGASTRODUODENOSCOPY (EGD) WITH PROPOFOL;  Surgeon: Jerene Bears, MD;  Location:  [facility] END[facility]COPY;  Service: Gastroenterology;  Laterality: N/A;   ESOPHAGOGASTRODUODENOSCOPY (EGD) WITH PROPOFOL N/A 05/27/2020   Procedure: ESOPHAGOGASTRODUODENOSCOPY (EGD) WITH PROPOFOL;  Surgeon: [name], DO;  [facility];  Service: Endoscopy;  Laterality: N/A;   EYE SURGERY Bilateral    lazer   HEMOSTASIS CLIP PLACEMENT  03/13/2019   Procedure: HEMOSTASIS CLIP PLACEMENT;  Surgeon: Jerene Bears, MD;  Location: [facility] ENDOSCOPY;  Service: Gastroenterology;;   KNEE ARTHROSCOPY Left 10/25/2006   POLYPECTOMY  03/13/2019   Procedure: POLYPECTOMY;  Surgeon: Jerene Bears, MD;  Location: [facility] ENDOSCOPY;  Service: Gastroenterology;;   REMOVAL OF STONES  02/03/2022    Procedure: REMOVAL OF STONES;  Surgeon: Carol Ada, MD;  Location: [facility];  Service: Gastroenterology;;   RIGHT HEART CATH N/A 07/24/2017   Procedure: RIGHT HEART CATH;  Surgeon: Jolaine Artist, MD;  Location: [facility] LAB;  Service: Cardiovascular;  Laterality: N/A;   RIGHT HEART CATHETERIZATION N/A 09/22/2013   Procedure: RIGHT HEART CATH;  Surgeon: Jolaine Artist, MD;  Location: [facility] CATH LAB;  Service: Cardiovascular;  Laterality: N/A;   SHOULDER ARTHROSCOPY WITH OPEN ROTATOR CUFF REPAIR Right 03/14/2014   Procedure: RIGHT SHOULDER ARTHROSCOPY WITH BICEPS RELEASE, OPEN SUBSCAPULA REPAIR, OPEN SUPRASPINATUS REPAIR.;  Surgeon: Meredith Pel, MD;  Location: [place];  Service: Orthopedics;  Laterality: Right;   SPHINCTEROTOMY  02/03/2022   Procedure: SPHINCTEROTOMY;  Surgeon: Carol Ada, MD;  Location: [facility];  Service: Gastroenterology;;   TEE WITHOUT CARDIOVERSION N/A 02/04/2022   Procedure: TRANSESOPHAGEAL ECHOCARDIOGRAM (TEE);  Surgeon: Jolaine Artist, MD;  Location: [facility] ENDOSCOPY;  Service: Cardiovascular;  Laterality: N/A;   TOE AMPUTATION Right 02/24/2018   GREAT TOE AND 2ND TOE AMPUTATION   TUBAL LIGATION  1970's    Allergies:  Allergies  Allergen Reactions   Keflex [Cephalexin] Diarrhea   Codeine Nausea And Vomiting and Other (See Comments)    Medications:   Prior to Admission medications   Medication Sig Start Date End Date Taking? Authorizing Provider  albuterol (VENTOLIN HFA) 108 (90 Base) MCG/ACT inhaler Inhale 1-2 puffs into the lungs every 6 (six) hours as needed for wheezing  or shortness of breath.   Yes [provider]  amiodarone (PACERONE) 200 MG tablet Take 1 tablet (200 mg total) by mouth daily. 01/29/22  Yes Milford, Maricela Bo, FNP  apixaban (ELIQUIS) 5 MG TABS tablet Take 1 tablet (5 mg total) by mouth 2 (two) times daily. 01/29/22  Yes Milford, Maricela Bo, FNP  aspirin EC 81 MG tablet Take 1 tablet (81 mg total) by mouth daily  with breakfast. 05/28/20  Yes Emokpae, Courage, MD  atorvastatin (LIPITOR) 80 MG tablet TAKE 1 TABLET BY MOUTH EVERY DAY 03/03/22  Yes Milford, Maricela Bo, FNP  carvedilol (COREG) 6.25 MG tablet Take 1 tablet (6.25 mg total) by mouth 2 (two) times daily with a meal. 01/29/22  Yes Milford, [place], FNP  colchicine 0.6 MG tablet Take 0.6 mg by mouth daily as needed (gout).   Yes [provider]  diphenoxylate-atropine (LOMOTIL) 2.5-0.025 MG tablet Take 1 tablet by mouth 4 (four) times daily as needed for diarrhea or loose stools. 08/19/21  Yes [name], MD  ferrous sulfate 325 (65 FE) MG tablet Take 325 mg by mouth 2 (two) times daily.   Yes [provider]  fidaxomicin (DIFICID) 200 MG TABS tablet Take 1 tablet (200 mg total) by mouth 2 (two) times daily. 03/28/22  Yes Rosiland Oz, MD  isosorbide mononitrate (IMDUR) 30 MG 24 hr tablet Take 0.5 tablets (15 mg total) by mouth daily. 12/10/21  Yes Milford, Maricela Bo, FNP  levothyroxine (SYNTHROID, LEVOTHROID) 50 MCG tablet Take 1 tablet (50 mcg total) by mouth daily before breakfast. 11/07/16  Yes Dena Billet B, PA-C  magnesium oxide (MAG-OX) 400 (240 Mg) MG tablet Take 1 tablet (400 mg total) by mouth daily. 07/18/21  Yes [name], MD  metolazone (ZAROXOLYN) 2.5 MG tablet Take 1 tablet (2.5 mg total) by mouth daily for 2 days, THEN 1 tablet (2.5 mg total) once a week. Every Tuesday with an additional 20 meq of potassium. 02/25/22 03/30/22 Yes Milford, Maricela Bo, FNP  nitrofurantoin, macrocrystal-monohydrate, (MACROBID) 100 MG capsule Take 1 capsule (100 mg total) by mouth 2 (two) times daily. 03/21/22  Yes [name], MD  nitroGLYCERIN (NITROSTAT) 0.4 MG SL tablet PLACE 1 TABLET (0.4 MG TOTAL) UNDER THE TONGUE EVERY 5 (FIVE) MINUTES AS NEEDED FOR CHEST PAIN. 02/06/21  Yes Larey Dresser, MD  OXYGEN Inhale 2 L/min into the lungs at bedtime.   Yes [provider]  polyethylene glycol (MIRALAX / GLYCOLAX) 17 g  packet Take 17 g by mouth as needed for mild constipation.   Yes [provider]  pregabalin (LYRICA) 150 MG capsule Take 150 mg by mouth 2 (two) times daily. 03/20/21  Yes [provider]  senna-docusate (SENOKOT-S) 8.6-50 MG tablet Take 1 tablet by mouth 2 (two) times daily. Patient taking differently: Take 1 tablet by mouth daily as needed for mild constipation or moderate constipation. 02/11/22  Yes Eugenie Filler, MD  torsemide (DEMADEX) 20 MG tablet Take 4 tablets (80 mg total) by mouth 2 (two) times daily. 03/06/22  Yes Bensimhon, Shaune Pascal, MD  ursodiol (ACTIGALL) 500 MG tablet Take 500 mg by mouth 3 (three) times daily. 12/11/20  Yes [provider]  blood glucose meter kit and supplies Use up to four times daily as directed. 01/23/22   Ghimire, Henreitta Leber, MD  Continuous Blood Gluc Sensor (FREESTYLE LIBRE 2 SENSOR) MISC  01/14/22   [provider]  Fingerstix Lancets MISC Use as directed to check blood  sugars as need up to 4 times daily 01/23/22   Jonetta Osgood, MD  glucose blood test strip use as directed to check blood sugars up to 4 times daily 01/23/22   Jonetta Osgood, MD  Insulin Pen Needle 32G X 4 MM MISC Use as directed 01/23/22   Jonetta Osgood, MD  vortioxetine HBr (TRINTELLIX) 10 MG TABS tablet Take 2 tablets (20 mg total) by mouth daily. Patient not taking: Reported on 03/30/2022 02/28/22   [name], MD    Discontinued Meds:   Medications Discontinued During This Encounter  Medication Reason   colchicine tablet 0.6 mg    albuterol (VENTOLIN HFA) 108 (90 Base) MCG/ACT inhaler 1-2 puff P&T Policy: Therapeutic Substitute   Nutritional Supplements (FRUIT & VEGETABLE DAILY PO) Patient Preference   nystatin (MYCOSTATIN/NYSTOP) powder Patient Preference   nystatin cream (MYCOSTATIN) Patient Preference   ursodiol (ACTIGALL) tablet 500 mg    insulin aspart (novoLOG) injection 0-9 Units     Social History:  reports that she  quit smoking about 24 years ago. Her smoking use included cigarettes. She has a 96.00 pack-year smoking history. She has never used smokeless tobacco. She reports that she does not currently use alcohol. She reports that she does not use drugs.  Family History:   Family History  Problem Relation Age of Onset   Heart attack Mother 18    Blood pressure 96/84, pulse 72, temperature 97.6 F (36.4 C), temperature source Oral, resp. rate 13, SpO2 98 %. GEN: NAD, sitting at an incline in bed HEENT: EOMI NECK: supple, JVD present RESP:clear bilaterally no c/w/r CV: RRR, I/V systolic murmur today no rub or gallop appreciated ABD: soft, obese, NTND, +BS, no guarding or rebound tenderness LE: 1+ LE edema with a superficial skin tear in the right foot NEURO: no asterixis       Kamaljit Hizer, Hunt Oris, MD 03/31/2022, 2:03 PM

## 2022-03-31 NOTE — Consult Note (Addendum)
[facility] NOTE   MRN : [id number]  Reason for Consult: AKI on CKD Referring Physician: DR. Augustin Coupe  History of Present Illness: 71 y/o female with AKI on CKD.  We have been asked to provide [facility] and permanent access.   She presented to the ED with LE superficial bleeding.  She has a history of Afib and is managed on Eliquis.    She has never had access prior and no chest implants.  She is right hand dominant.      Past medical history:  morbid obesity, RBBB, DM2, Hypothyroidism diastolic heart failure, CAD MI 2000/2007 BMS 2007, TIA, CKD Stage IV and PAD s/p R transmetatarsal amputation.    Current Facility-Administered Medications  Medication Dose Route Frequency Provider Last Rate Last Admin   albuterol (PROVENTIL) (2.5 MG/3ML) 0.083% nebulizer solution 2.5 mg  2.5 mg Nebulization Q6H PRN Wynetta Fines T, MD       amiodarone (PACERONE) tablet 200 mg  200 mg Oral Daily Wynetta Fines T, MD   200 mg at 03/31/22 1303   aspirin EC tablet 81 mg  81 mg Oral Q breakfast Wynetta Fines T, MD   81 mg at 03/31/22 0908   atorvastatin (LIPITOR) tablet 80 mg  80 mg Oral Daily Wynetta Fines T, MD   80 mg at 03/31/22 1303   ferrous sulfate tablet 325 mg  325 mg Oral BID Wynetta Fines T, MD   325 mg at 03/31/22 1304   fidaxomicin (DIFICID) tablet 200 mg  200 mg Oral BID Wynetta Fines T, MD   200 mg at 03/31/22 1301   furosemide (LASIX) 120 mg in dextrose 5 % 50 mL IVPB  120 mg Intravenous Q8H Dwana Melena, MD       heparin injection 5,000 Units  5,000 Units Subcutaneous Q12H Wynetta Fines T, MD   5,000 Units at 03/31/22 1305   hydrALAZINE (APRESOLINE) tablet 25 mg  25 mg Oral Q6H PRN Wynetta Fines T, MD       insulin pump   Subcutaneous TID WC, HS, 0200 Elodia Florence., MD   2.65 each at 03/31/22 1321   levothyroxine (SYNTHROID) tablet 50 mcg  50 mcg Oral Q0600 Wynetta Fines T, MD   50 mcg at 03/31/22 0908   linagliptin (TRADJENTA) tablet 5 mg  5 mg Oral Daily Wynetta Fines T, MD   5  mg at 03/31/22 1303   pregabalin (LYRICA) capsule 150 mg  150 mg Oral BID Wynetta Fines T, MD   150 mg at 03/31/22 1302   ursodiol (ACTIGALL) capsule 300 mg  300 mg Oral Daily Wynetta Fines T, MD   300 mg at 03/31/22 1302   vortioxetine HBr (TRINTELLIX) tablet 20 mg  20 mg Oral Daily Wynetta Fines T, MD   20 mg at 03/31/22 1301   Current Outpatient Medications  Medication Sig Dispense Refill   albuterol (VENTOLIN HFA) 108 (90 Base) MCG/ACT inhaler Inhale 1-2 puffs into the lungs every 6 (six) hours as needed for wheezing or shortness of breath.     amiodarone (PACERONE) 200 MG tablet Take 1 tablet (200 mg total) by mouth daily. 90 tablet 3   apixaban (ELIQUIS) 5 MG TABS tablet Take 1 tablet (5 mg total) by mouth 2 (two) times daily. 180 tablet 3   aspirin EC 81 MG tablet Take 1 tablet (81 mg total) by mouth daily with breakfast. 30 tablet 11   atorvastatin (LIPITOR) 80 MG tablet TAKE 1 TABLET  BY MOUTH EVERY DAY 90 tablet 1   carvedilol (COREG) 6.25 MG tablet Take 1 tablet (6.25 mg total) by mouth 2 (two) times daily with a meal. 180 tablet 3   colchicine 0.6 MG tablet Take 0.6 mg by mouth daily as needed (gout).     diphenoxylate-atropine (LOMOTIL) 2.5-0.025 MG tablet Take 1 tablet by mouth 4 (four) times daily as needed for diarrhea or loose stools. 30 tablet 0   ferrous sulfate 325 (65 FE) MG tablet Take 325 mg by mouth 2 (two) times daily.     fidaxomicin (DIFICID) 200 MG TABS tablet Take 1 tablet (200 mg total) by mouth 2 (two) times daily. 20 tablet 0   isosorbide mononitrate (IMDUR) 30 MG 24 hr tablet Take 0.5 tablets (15 mg total) by mouth daily. 45 tablet 3   levothyroxine (SYNTHROID, LEVOTHROID) 50 MCG tablet Take 1 tablet (50 mcg total) by mouth daily before breakfast. 90 tablet 1   magnesium oxide (MAG-OX) 400 (240 Mg) MG tablet Take 1 tablet (400 mg total) by mouth daily. 90 tablet 3   metolazone (ZAROXOLYN) 2.5 MG tablet Take 1 tablet (2.5 mg total) by mouth daily for 2 days, THEN 1 tablet  (2.5 mg total) once a week. Every Tuesday with an additional 20 meq of potassium. 6 tablet 3   nitrofurantoin, macrocrystal-monohydrate, (MACROBID) 100 MG capsule Take 1 capsule (100 mg total) by mouth 2 (two) times daily. 14 capsule 0   nitroGLYCERIN (NITROSTAT) 0.4 MG SL tablet PLACE 1 TABLET (0.4 MG TOTAL) UNDER THE TONGUE EVERY 5 (FIVE) MINUTES AS NEEDED FOR CHEST PAIN. 25 tablet 1   OXYGEN Inhale 2 L/min into the lungs at bedtime.     polyethylene glycol (MIRALAX / GLYCOLAX) 17 g packet Take 17 g by mouth as needed for mild constipation.     pregabalin (LYRICA) 150 MG capsule Take 150 mg by mouth 2 (two) times daily.     senna-docusate (SENOKOT-S) 8.6-50 MG tablet Take 1 tablet by mouth 2 (two) times daily. (Patient taking differently: Take 1 tablet by mouth daily as needed for mild constipation or moderate constipation.)     torsemide (DEMADEX) 20 MG tablet Take 4 tablets (80 mg total) by mouth 2 (two) times daily. 240 tablet 3   ursodiol (ACTIGALL) 500 MG tablet Take 500 mg by mouth 3 (three) times daily.     blood glucose meter kit and supplies Use up to four times daily as directed. 1 each 0   Continuous Blood Gluc Sensor (FREESTYLE LIBRE 2 SENSOR) MISC      Fingerstix Lancets MISC Use as directed to check blood sugars as need up to 4 times daily 100 each 0   glucose blood test strip use as directed to check blood sugars up to 4 times daily 100 each 0   Insulin Pen Needle 32G X 4 MM MISC Use as directed 200 each 0   vortioxetine HBr (TRINTELLIX) 10 MG TABS tablet Take 2 tablets (20 mg total) by mouth daily. (Patient not taking: Reported on 03/30/2022) 90 tablet 3    Pt meds include: Statin :Yes Betablocker: No ASA: Yes Other anticoagulants/antiplatelets: Eliquis  Past Medical History:  Diagnosis Date   Acute MI ([place]) 1999; 2007   Anemia    hx   Anginal pain ([place])    Anxiety    ARF (acute renal failure) ([place]) 06/2017   [facility]   Arthritis    "generalized"  (03/15/2014)   CAD (coronary artery disease)  MI in 2000 - MI  2007 - treated bare metal stent (no nuclear since then as 9/11)   Carotid artery disease (H[facility])    CHF (congestive heart failure) ([place])    Chronic diastolic heart failure ([place])    a) ECHO (08/2013) EF 55-60% and RV function nl b) RHC (08/2013) RA 4, RV 30/5/7, PA 25/10 (16), PCWP 7, Fick CO/CI 6.3/2.7, PVR 1.5 WU, PA 61 and 66%   Daily headache    "~ every other day; since I fell in June" (03/15/2014)   Depression    Dyslipidemia    Dyspnea    Exertional shortness of breath    HTN (hypertension)    Hypothyroidism    Neuropathy    Obesity    Osteoarthritis    Peripheral neuropathy    PONV (postoperative nausea and vomiting)    RBBB (right bundle branch block)    Old   Stroke ([place])    mini strokes   Syncope    likely due to low blood sugar   Tachycardia    Sinus tachycardia   Type II diabetes mellitus ([place])    Type II   Urinary incontinence    Venous insufficiency     Past Surgical History:  Procedure Laterality Date   ABDOMINAL HYSTERECTOMY  1980's   AMPUTATION Right 02/24/2018   Procedure: RIGHT FOOT GREAT TOE AND 2ND TOE AMPUTATION;  Surgeon: Newt Minion, MD;  Location: [facility];  Service: Orthopedics;  Laterality: Right;   AMPUTATION Right 04/30/2018   Procedure: RIGHT TRANSMETATARSAL AMPUTATION;  Surgeon: Newt Minion, MD;  Location: [facility];  Service: Orthopedics;  Laterality: Right;   BIOPSY  05/27/2020   Procedure: BIOPSY;  Surgeon: [name], DO;  [facility];  Service: Endoscopy;;   CATARACT EXTRACTION, BILATERAL Bilateral ?2013   COLONOSCOPY W/ POLYPECTOMY     COLONOSCOPY WITH PROPOFOL N/A 03/13/2019   Procedure: COLONOSCOPY WITH PROPOFOL;  Surgeon: Jerene Bears, MD;  Location: [facility];  Service: Gastroenterology;  Laterality: N/A;   CORONARY ANGIOPLASTY WITH STENT PLACEMENT  1999; 2007   "1 + 1"   ERCP N/A 02/03/2022   Procedure: ENDOSCOPIC RETROGRADE CHOLANGIOPANCREATOGRAPHY  (ERCP);  Surgeon: Carol Ada, MD;  Location: [facility];  Service: Gastroenterology;  Laterality: N/A;   ESOPHAGOGASTRODUODENOSCOPY (EGD) WITH PROPOFOL N/A 03/13/2019   Procedure: ESOPHAGOGASTRODUODENOSCOPY (EGD) WITH PROPOFOL;  Surgeon: Jerene Bears, MD;  Location: [facility] ENDOSCOPY;  Service: Gastroenterology;  Laterality: N/A;   ESOPHAGOGASTRODUODENOSCOPY (EGD) WITH PROPOFOL N/A 05/27/2020   Procedure: ESOPHAGOGASTRODUODENOSCOPY (EGD) WITH PROPOFOL;  Surgeon: [name], DO;  [facility];  Service: Endoscopy;  Laterality: N/A;   EYE SURGERY Bilateral    lazer   HEMOSTASIS CLIP PLACEMENT  03/13/2019   Procedure: HEMOSTASIS CLIP PLACEMENT;  Surgeon: Jerene Bears, MD;  Location: [facility] ENDOSCOPY;  Service: Gastroenterology;;   KNEE ARTHROSCOPY Left 10/25/2006   POLYPECTOMY  03/13/2019   Procedure: POLYPECTOMY;  Surgeon: Jerene Bears, MD;  Location: [facility] ENDOSCOPY;  Service: Gastroenterology;;   REMOVAL OF STONES  02/03/2022   Procedure: REMOVAL OF STONES;  Surgeon: Carol Ada, MD;  Location: [place];  Service: Gastroenterology;;   RIGHT HEART CATH N/A 07/24/2017   Procedure: RIGHT HEART CATH;  Surgeon: Jolaine Artist, MD;  Location: [facility];  Service: Cardiovascular;  Laterality: N/A;   RIGHT HEART CATHETERIZATION N/A 09/22/2013   Procedure: RIGHT HEART CATH;  Surgeon: Jolaine Artist, MD;  Location: [facility] CATH LAB;  Service: Cardiovascular;  Laterality: N/A;   SHOULDER ARTHROSCOPY WITH OPEN ROTATOR CUFF REPAIR Right 03/14/2014   Procedure: RIGHT SHOULDER ARTHROSCOPY WITH BICEPS RELEASE, OPEN SUBSCAPULA REPAIR, OPEN SUPRASPINATUS REPAIR.;  Surgeon: Meredith Pel, MD;  Location: [facility];  Service: Orthopedics;  Laterality: Right;   SPHINCTEROTOMY  02/03/2022   Procedure: SPHINCTEROTOMY;  Surgeon: Carol Ada, MD;  Location: [facility];  Service: Gastroenterology;;   TEE WITHOUT CARDIOVERSION N/A 02/04/2022   Procedure: TRANSESOPHAGEAL ECHOCARDIOGRAM (TEE);   Surgeon: Jolaine Artist, MD;  Location: [facility] ENDOSCOPY;  Service: Cardiovascular;  Laterality: N/A;   TOE AMPUTATION Right 02/24/2018   GREAT TOE AND 2ND TOE AMPUTATION   TUBAL LIGATION  1970's    Social History Social History   Tobacco Use   Smoking status: Former    Packs/day: 3.00    Years: 32.00    Total pack years: 96.00    Types: Cigarettes    Quit date: 10/24/1997    Years since quitting: 24.4   Smokeless tobacco: Never  Vaping Use   Vaping Use: Never used  Substance Use Topics   Alcohol use: Not Currently    Comment: "might have 2-3 daiquiris in the summer"   Drug use: No    Family History Family History  Problem Relation Age of Onset   Heart attack Mother 82    Allergies  Allergen Reactions   Keflex [Cephalexin] Diarrhea   Codeine Nausea And Vomiting and Other (See Comments)     REVIEW OF SYSTEMS  General: _0  Weight loss, _1  Fever, _2  chills Neurologic: _3  Dizziness, _4  Blackouts, _5  Seizure _6  Stroke, _7  "Mini stroke", _8  Slurred speech, _9  Temporary blindness; _10  weakness in arms or legs, _11  Hoarseness _12  Dysphagia Cardiac: _13  Chest pain/pressure, _14  Shortness of breath at rest _15  Shortness of breath with exertion, [ x] Atrial fibrillation or irregular heartbeat  Vascular: _16  Pain in legs with walking, _17  Pain in legs at rest, _18  Pain in legs at night,  [x ] Non-healing ulcer, _19  Blood clot in vein/DVT,   Pulmonary: _20  Home oxygen, _21  Productive cough, _22  Coughing up blood, _23  Asthma,  _24  Wheezing _25  COPD Musculoskeletal:  _26  Arthritis, _27  Low back pain, [ x] Joint pain Hematologic: _28  Easy Bruising, _29  Anemia; _30  Hepatitis Gastrointestinal: _31  Blood in stool, _32  Gastroesophageal Reflux/heartburn, Urinary: [ x] chronic Kidney disease, _33  on HD - _34  MWF or _35  TTHS, _36  Burning with urination, _37  Difficulty urinating Skin: _38  Rashes, [x ] Wounds Psychological: _39  Anxiety, _40  Depression  Physical  Examination Vitals:   03/31/22 0845 03/31/22 0915 03/31/22 0925 03/31/22 1425  BP:  96/84  (!) 113/54  Pulse: 73 72  72  Resp: _41 Temp:   97.6 F (36.4 C) 98.2 F (36.8 C)  TempSrc:   Oral Oral  SpO2: 100% 98%  98%   There is no height or weight on file to calculate BMI.  General:  WDWN in NAD HENT: WNL Eyes: Pupils equal Pulmonary: normal non-labored breathing , without Rales, rhonchi,  wheezing Cardiac: Afib, without  Murmurs, rubs or gallops; No carotid bruits Abdomen: soft, NT, no masses Skin: no rashes, ulcers noted;  no Gangrene , no cellulitis; no open wounds;   Vascular Exam/Pulses:palpable radial pulses B UE   Musculoskeletal: no  muscle wasting or atrophy; positive B LE edema  Neurologic: A&O X 3; Appropriate Affect ;  SENSATION: normal; MOTOR FUNCTION: grossly intact and equal B   Speech is fluent/normal   Significant Diagnostic Studies: CBC Lab Results  Component Value Date   WBC 9.8 03/31/2022   HGB 7.5 (L) 03/31/2022   HCT 24.1 (L) 03/31/2022   MCV 83.7 03/31/2022   PLT 104 (L) 03/31/2022    BMET    Component Value Date/Time   NA 139 03/31/2022 0414   K 3.4 (L) 03/31/2022 0414   CL 101 03/31/2022 0414   CO2 19 (L) 03/31/2022 0414   GLUCOSE 275 (H) 03/31/2022 0414   GLUCOSE >444 (H) 06/07/2015 1311   BUN 180 (H) 03/31/2022 0414   CREATININE 7.09 (H) 03/31/2022 0414   CREATININE 6.22 (H) 03/26/2022 1620   CALCIUM 6.9 (L) 03/31/2022 0414   GFRNONAA 6 (L) 03/31/2022 0414   GFRNONAA 11 (L) 11/15/2020 1452   GFRAA 13 (L) 11/15/2020 1452   CrCl cannot be calculated (Unknown ideal weight.).  COAG Lab Results  Component Value Date   INR 1.5 (H) 02/01/2022   INR 1.3 (H) 01/14/2022   INR 0.9 03/05/2021     Non-Invasive Vascular Imaging:  Pending vein mapping   ASSESSMENT/PLAN:  AKI on CKD We have been ask to provide [facility] and permanent access.  Vein mapping is ordered.  She is right hand dominant.  We will plan Left UE AV fistula  verse graft and TDC placement.  The Eliquis should be held for 48 hours prior to surgery.  Dr. Donzetta Matters will see her once the vein mapping is complete to discuss plans and set a date.       Roxy Horseman 03/31/2022 3:41 PM  I have independently interviewed and examined patient and agree with PA assessment and plan above.  Plan will be for tunneled dialysis catheter and probable left upper extremity fistula or graft later this week.  Continue to hold Eliquis.  Kalan Yeley C. Donzetta Matters, MD [facility] [place] Office: [phone] Pager: [phone]

## 2022-04-01 ENCOUNTER — Inpatient Hospital Stay (HOSPITAL_COMMUNITY): Payer: HMO

## 2022-04-01 ENCOUNTER — Encounter (HOSPITAL_COMMUNITY): Payer: HMO | Admitting: Internal Medicine

## 2022-04-01 DIAGNOSIS — L97211 Non-pressure chronic ulcer of right calf limited to breakdown of skin: Secondary | ICD-10-CM | POA: Diagnosis not present

## 2022-04-01 DIAGNOSIS — I83012 Varicose veins of right lower extremity with ulcer of calf: Secondary | ICD-10-CM | POA: Diagnosis not present

## 2022-04-01 DIAGNOSIS — N186 End stage renal disease: Secondary | ICD-10-CM

## 2022-04-01 DIAGNOSIS — N179 Acute kidney failure, unspecified: Secondary | ICD-10-CM | POA: Diagnosis not present

## 2022-04-01 DIAGNOSIS — L97219 Non-pressure chronic ulcer of right calf with unspecified severity: Secondary | ICD-10-CM | POA: Diagnosis not present

## 2022-04-01 LAB — CBC WITH DIFFERENTIAL/PLATELET
Abs Immature Granulocytes: 0.05 10*3/uL (ref 0.00–0.07)
Basophils Absolute: 0 10*3/uL (ref 0.0–0.1)
Basophils Relative: 0 %
Eosinophils Absolute: 0.2 10*3/uL (ref 0.0–0.5)
Eosinophils Relative: 2 %
HCT: 23.6 % — ABNORMAL LOW (ref 36.0–46.0)
Hemoglobin: 7.3 g/dL — ABNORMAL LOW (ref 12.0–15.0)
Immature Granulocytes: 1 %
Lymphocytes Relative: 6 %
Lymphs Abs: 0.6 10*3/uL — ABNORMAL LOW (ref 0.7–4.0)
MCH: 25.4 pg — ABNORMAL LOW (ref 26.0–34.0)
MCHC: 30.9 g/dL (ref 30.0–36.0)
MCV: 82.2 fL (ref 80.0–100.0)
Monocytes Absolute: 1.1 10*3/uL — ABNORMAL HIGH (ref 0.1–1.0)
Monocytes Relative: 11 %
Neutro Abs: 7.7 10*3/uL (ref 1.7–7.7)
Neutrophils Relative %: 80 %
Platelets: 103 10*3/uL — ABNORMAL LOW (ref 150–400)
RBC: 2.87 MIL/uL — ABNORMAL LOW (ref 3.87–5.11)
RDW: 17.1 % — ABNORMAL HIGH (ref 11.5–15.5)
WBC: 9.7 10*3/uL (ref 4.0–10.5)
nRBC: 0 % (ref 0.0–0.2)

## 2022-04-01 LAB — PHOSPHORUS: Phosphorus: 7.9 mg/dL — ABNORMAL HIGH (ref 2.5–4.6)

## 2022-04-01 LAB — CBG MONITORING, ED
Glucose-Capillary: 173 mg/dL — ABNORMAL HIGH (ref 70–99)
Glucose-Capillary: 149 mg/dL — ABNORMAL HIGH (ref 70–99)

## 2022-04-01 LAB — COMPREHENSIVE METABOLIC PANEL
ALT: 15 U/L (ref 0–44)
AST: 14 U/L — ABNORMAL LOW (ref 15–41)
Albumin: 2.1 g/dL — ABNORMAL LOW (ref 3.5–5.0)
Alkaline Phosphatase: 92 U/L (ref 38–126)
Anion gap: 16 — ABNORMAL HIGH (ref 5–15)
BUN: 183 mg/dL — ABNORMAL HIGH (ref 8–23)
CO2: 19 mmol/L — ABNORMAL LOW (ref 22–32)
Calcium: 6.9 mg/dL — ABNORMAL LOW (ref 8.9–10.3)
Chloride: 103 mmol/L (ref 98–111)
Creatinine, Ser: 7.14 mg/dL — ABNORMAL HIGH (ref 0.44–1.00)
GFR, Estimated: 6 mL/min — ABNORMAL LOW (ref >60)
Glucose, Bld: 190 mg/dL — ABNORMAL HIGH (ref 70–99)
Potassium: 3.5 mmol/L (ref 3.5–5.1)
Sodium: 138 mmol/L (ref 135–145)
Total Bilirubin: 0.6 mg/dL (ref 0.3–1.2)
Total Protein: 5 g/dL — ABNORMAL LOW (ref 6.5–8.1)

## 2022-04-01 LAB — IRON AND TIBC
Iron: 15 ug/dL — ABNORMAL LOW (ref 28–170)
Saturation Ratios: 7 % — ABNORMAL LOW (ref 10.4–31.8)
TIBC: 228 ug/dL — ABNORMAL LOW (ref 250–450)
UIBC: 213 ug/dL

## 2022-04-01 LAB — GLUCOSE, CAPILLARY
Glucose-Capillary: 166 mg/dL — ABNORMAL HIGH (ref 70–99)
Glucose-Capillary: 154 mg/dL — ABNORMAL HIGH (ref 70–99)

## 2022-04-01 LAB — FERRITIN: Ferritin: 109 ng/mL (ref 11–307)

## 2022-04-01 LAB — HEMOGLOBIN AND HEMATOCRIT, BLOOD
HCT: 25.2 % — ABNORMAL LOW (ref 36.0–46.0)
Hemoglobin: 7.9 g/dL — ABNORMAL LOW (ref 12.0–15.0)

## 2022-04-01 LAB — HEPATITIS B SURFACE ANTIGEN: Hepatitis B Surface Ag: NONREACTIVE

## 2022-04-01 LAB — MAGNESIUM: Magnesium: 2.2 mg/dL (ref 1.7–2.4)

## 2022-04-01 MED ORDER — ACETAMINOPHEN 325 MG PO TABS: 650.0000 mg | ORAL_TABLET | Freq: Four times a day (QID) | ORAL | Status: DC | PRN

## 2022-04-01 MED ORDER — HYDROMORPHONE HCL 1 MG/ML IJ SOLN
0.5000 mg | INTRAMUSCULAR | Status: AC | PRN
  Administered 2022-04-05 – 2022-04-07 (×2): 0.5 mg via INTRAVENOUS
  Filled 2022-04-01 (×2): qty 1

## 2022-04-01 MED ORDER — INSULIN ASPART 100 UNIT/ML IJ SOLN
0.0000 [IU] | Freq: Every day | INTRAMUSCULAR | Status: DC
  Administered 2022-04-02: 4 [IU] via SUBCUTANEOUS
  Administered 2022-04-03: 5 [IU] via SUBCUTANEOUS

## 2022-04-01 MED ORDER — OXYCODONE HCL 5 MG PO TABS
5.0000 mg | ORAL_TABLET | ORAL | Status: AC | PRN
  Administered 2022-04-02 – 2022-04-04 (×2): 5 mg via ORAL
  Filled 2022-04-01 (×2): qty 1

## 2022-04-01 MED ORDER — PROCHLORPERAZINE EDISYLATE 10 MG/2ML IJ SOLN: 5.0000 mg | Freq: Four times a day (QID) | INTRAMUSCULAR | Status: DC | PRN

## 2022-04-01 MED ORDER — INSULIN ASPART 100 UNIT/ML IJ SOLN
0.0000 [IU] | Freq: Three times a day (TID) | INTRAMUSCULAR | Status: DC
  Administered 2022-04-02: 2 [IU] via SUBCUTANEOUS
  Administered 2022-04-03 (×2): 11 [IU] via SUBCUTANEOUS
  Administered 2022-04-03: 15 [IU] via SUBCUTANEOUS
  Administered 2022-04-04 (×2): 3 [IU] via SUBCUTANEOUS
  Administered 2022-04-04: 5 [IU] via SUBCUTANEOUS
  Administered 2022-04-05: 2 [IU] via SUBCUTANEOUS
  Administered 2022-04-05: 5 [IU] via SUBCUTANEOUS
  Administered 2022-04-06: 2 [IU] via SUBCUTANEOUS

## 2022-04-01 MED ORDER — POLYETHYLENE GLYCOL 3350 17 G PO PACK
17.0000 g | PACK | Freq: Every day | ORAL | Status: DC | PRN
  Administered 2022-04-03: 17 g via ORAL
  Filled 2022-04-01: qty 1

## 2022-04-01 MED ORDER — INSULIN GLARGINE-YFGN 100 UNIT/ML ~~LOC~~ SOLN
17.0000 [IU] | Freq: Every day | SUBCUTANEOUS | Status: DC
  Administered 2022-04-01 – 2022-04-02 (×2): 17 [IU] via SUBCUTANEOUS
  Filled 2022-04-01 (×3): qty 0.17

## 2022-04-01 MED ORDER — VANCOMYCIN HCL IN DEXTROSE 1-5 GM/200ML-% IV SOLN
1000.0000 mg | INTRAVENOUS | Status: AC
  Administered 2022-04-02: 1000 mg via INTRAVENOUS
  Filled 2022-04-01: qty 200

## 2022-04-01 MED ORDER — SEVELAMER CARBONATE 800 MG PO TABS
1600.0000 mg | ORAL_TABLET | Freq: Three times a day (TID) | ORAL | Status: DC
  Administered 2022-04-01 – 2022-04-07 (×13): 1600 mg via ORAL
  Filled 2022-04-01 (×13): qty 2

## 2022-04-01 MED ORDER — CHLORHEXIDINE GLUCONATE CLOTH 2 % EX PADS
6.0000 | MEDICATED_PAD | Freq: Every day | CUTANEOUS | Status: DC
  Administered 2022-04-02 – 2022-04-07 (×5): 6 via TOPICAL

## 2022-04-01 NOTE — Consult Note (Addendum)
[place] Nurse Consult Note: Dr Sharol Given of the ortho service is following for assessment and plan of care.  Please refer to his consult notes for assessment and measurements.  [place] team requested to apply compression wraps to left leg.   Right leg with Ardelia Mems boot and coban in place; patient states this was applied yesterday.  Left posterior leg with full thickness wound, red and moist, few scattered full thickness wounds to anterior calf, dry brown and scabbed.  Applied Adaptic to wounds to protect and prevent adherence over previous graft site to posterior leg, then 4 layer Profore compression wraps.  Dressing procedure/placement/frequency: Topical treatment orders provided for bedside nurses as follows:  Leave bilat compression wraps in place; they will need to be changed on Mon, 11/13 Left leg Adaptic and 4 layer Profore wraps  Right leg Una boot [place] team will change left leg compression wraps as requested next Monday if they are still in the hospital at that time. Ortho tech would change the right [employer] on that day. Pt is requested to follow-up with Dr Sharol Given in one week if discharged.  Thank-you,  Julien Girt MSN, [place], [place], [place], [place]

## 2022-04-01 NOTE — Plan of Care (Signed)

## 2022-04-01 NOTE — H&P (View-Only) (Signed)
  Progress Note    04/01/2022 5:22 PM * No surgery date entered *  Subjective: No overnight issues Vitals:   04/01/22 1149 04/01/22 1353  BP:  (!) 90/56  Pulse:  78  Resp:  20  Temp: 98.4 F (36.9 C) 99.2 F (37.3 C)  SpO2:  96%    Physical Exam: Awake alert oriented Bilateral upper extremity edema 1+ left radial pulse  CBC    Component Value Date/Time   WBC 9.7 04/01/2022 0756   RBC 2.87 (L) 04/01/2022 0756   HGB 7.9 (L) 04/01/2022 1505   HCT 25.2 (L) 04/01/2022 1505   PLT 103 (L) 04/01/2022 0756   MCV 82.2 04/01/2022 0756   MCH 25.4 (L) 04/01/2022 0756   MCHC 30.9 04/01/2022 0756   RDW 17.1 (H) 04/01/2022 0756   LYMPHSABS 0.6 (L) 04/01/2022 0756   MONOABS 1.1 (H) 04/01/2022 0756   EOSABS 0.2 04/01/2022 0756   BASOSABS 0.0 04/01/2022 0756    BMET    Component Value Date/Time   NA 138 04/01/2022 0756   K 3.5 04/01/2022 0756   CL 103 04/01/2022 0756   CO2 19 (L) 04/01/2022 0756   GLUCOSE 190 (H) 04/01/2022 0756   GLUCOSE >444 (H) 06/07/2015 1311   BUN 183 (H) 04/01/2022 0756   CREATININE 7.14 (H) 04/01/2022 0756   CREATININE 6.22 (H) 03/26/2022 1620   CALCIUM 6.9 (L) 04/01/2022 0756   GFRNONAA 6 (L) 04/01/2022 0756   GFRNONAA 11 (L) 11/15/2020 1452   GFRAA 13 (L) 11/15/2020 1452    INR    Component Value Date/Time   INR 1.5 (H) 02/01/2022 0726     Intake/Output Summary (Last 24 hours) at 04/01/2022 1722 Last data filed at 04/01/2022 0103 Gross per 24 hour  Intake 104.04 ml  Output --  Net 104.04 ml     Assessment/plan:  71 y.o. female is here with new diagnosis end-stage renal disease.  Plan will be OR tomorrow with tunneled dialysis catheter placement and left arm AV fistula versus graft.  She will be n.p.o. past midnight.    Debany Vantol C. Donzetta Matters, MD [facility] [place] Office: [phone] Pager: [phone]  04/01/2022 5:22 PM

## 2022-04-01 NOTE — Progress Notes (Addendum)
PROGRESS NOTE    Susan [name]  ION:[id number] DOB: Oct 06, 1950 DOA: 03/30/2022 PCP: Donita Brooks, MD  Chief Complaint  Patient presents with   Wound Check    Brief Narrative:  Susan [name] is Susan [name] 71 y.o. female with medical history significant of PAF on Eliquis, CKD stage IV, CAD, chronic HFpEF, morbid obesity, IIDM, chronic venous stasis wound on right leg, recent recurrent C. difficile colitis, recurrent UTIs, presented with bleeding from the right leg venous stasis ulcer.   Hospitalization also complicated by worsening renal function with uremia.  Renal following, planning for trial of lasix and likely will need dialysis.  Vascular has been consulted for access + TDC.   Assessment & Plan:   Principal Problem:   AKI (acute kidney injury) (HCC) Active Problems:   Venous stasis ulcer of right calf (HCC)   Assessment and Plan: Acute Kidney Injury on CKD IV or V  Uremia Baseline creatinine appears to be ~3-5 over the past year or so, recently worsening with creatinine 7.09 + asterixis, mild confusion she reports for the past week or so Appreciate renal assistance, diuretic challenge with 120 mg lasix q8 - may need dialysis.  Vascular consulted for [facility] and permanent access.  Hold eliquis 48 hrs prior to surgery.  Chronic HFrEF (EF 30%) - Echo 01/2022 TEE with EF 30%, RVSF moderately reduced (see report) - case discussed by Dr. Juel Burrow with Dr. Shirlee Latch of advanced HF service who did not believe patient would benefit from inotropes and not candidate for home inotropes.  Right leg venous ulcer bleeding  Bleeding from Varicose Veins secondary to Anticoagulation therapy for Atrial Fibrillation -holding eliquis for now -s/p DDAVP in setting of her uremia -appreciate orthopedic assistance, recommending UNNA boots and prafo boot for right    Acute Blood Loss Anemia Stable, but fluctuating Follow type and screen, repeat H/H Transfuse prn    PAF - Hold off Eliquis due to bleeding above.   - Continue amiodarone   HTN -Blood pressure borderline low, hold off home BP meds, start as needed hydralazine -low threshold for transfusion   Recurrent C. difficile colitis -Continue for Fidaxomycin -saw ID on 11/1, was prescribed fidaxomicin on 11/3 for her C difficile    Concern for Colovesical Fistula - discussed with urology, consider CT cystogram while she's here, will defer for now while vascular planning procedure  IDDM hyperglycemia -has insulin pump, protocol for patient administration ordered  - addendum, saw note from RN that husband had taken pump home, start basal at half recommended dose by diabetes coordinator given NPO tomorrow, continue SSI -May have insulin resistance, add Januvia   History of recurrent UTI -On chronic suppression antibiotics, but we will hold off MicroBid for now   Gout -Hold off colchicine due to AKI    DVT prophylaxis: heparin Code Status: full Family Communication: none Disposition:   Status is: Inpatient Remains inpatient appropriate because: pending further improvement   Consultants:  Renal ortho  Procedures:  none  Antimicrobials:  Anti-infectives (From admission, onward)    Start     Dose/Rate Route Frequency Ordered Stop   03/30/22 2200  fidaxomicin (DIFICID) tablet 200 mg        200 mg Oral 2 times daily 03/30/22 1759 04/06/22 2159       Subjective: No new complaints today  Objective: Vitals:   03/31/22 2315 04/01/22 0500 04/01/22 0745 04/01/22 0746  BP: (!) 138/99 (!) 152/97 (!) 113/54   Pulse: 77 72 74   Resp: 12 13  12   Temp: 98.4 F (36.9 C) 98.1 F (36.7 C) 98.2 F (36.8 C)   TempSrc: Oral Oral Oral   SpO2: 93% 92% 96% 96%    Intake/Output Summary (Last 24 hours) at 04/01/2022 0842 Last data filed at 04/01/2022 0103 Gross per 24 hour  Intake 104.04 ml  Output --  Net 104.04 ml   There were no vitals filed for this visit.  Examination:  General: No acute distress. Cardiovascular:  RRR Lungs: unlabored Abdomen: Soft, nontender, nondistended Neurological: asterixis, myoclonic jerking Extremities: RLE in unna boot, LLE with swelling     Data Reviewed: I have personally reviewed following labs and imaging studies  CBC: Recent Labs  Lab 03/26/22 1620 03/30/22 1018 03/30/22 1720 03/31/22 0414 03/31/22 1528 04/01/22 0756  WBC 7.3 8.4  --  9.8  --  9.7  NEUTROABS  --  6.9  --   --   --  7.7  HGB 9.9* 8.7* 8.9* 7.5* 8.0* 7.3*  HCT 32.2* 28.0* 28.8* 24.1* 25.7* 23.6*  MCV 84.1 84.3  --  83.7  --  82.2  PLT 133* 116*  --  104*  --  103*    Basic Metabolic Panel: Recent Labs  Lab 03/26/22 1620 03/30/22 1018 03/31/22 0414  NA 137 136 139  K 3.6 3.4* 3.4*  CL 98 97* 101  CO2 21 21* 19*  GLUCOSE 271* 261* 275*  BUN 169* 177* 180*  CREATININE 6.22* 7.14* 7.09*  CALCIUM 7.2* 6.9* 6.9*    GFR: CrCl cannot be calculated (Unknown ideal weight.).  Liver Function Tests: Recent Labs  Lab 03/26/22 1620  AST 14  ALT 18  BILITOT 0.6  PROT 6.3    CBG: Recent Labs  Lab 03/30/22 2138 03/31/22 0915 03/31/22 1317 03/31/22 2143 04/01/22 0754  GLUCAP 311* 266* 238* 218* 173*     No results found for this or any previous visit (from the past 240 hour(s)).       Radiology Studies: DG Ankle Complete Right  Result Date: 03/30/2022 CLINICAL DATA:  Pain. EXAM: RIGHT ANKLE - COMPLETE 3+ VIEW COMPARISON:  None Available. FINDINGS: No fracture or dislocation. Bones are diffusely demineralized. Distal foot amputation incompletely visualized. IMPRESSION: 1. No acute bony findings. 2. Distal foot amputation incompletely visualized. Electronically Signed   By: Kennith Center M.D.   On: 03/30/2022 11:24   DG Foot Complete Right  Result Date: 03/30/2022 CLINICAL DATA:  Blister on the back of the right heel. EXAM: RIGHT FOOT COMPLETE - 3+ VIEW COMPARISON:  None Available. 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 Great toe amputation at the level of the MTP joint. Bones are diffusely demineralized lateral film shows no bony destruction of the calcaneus to suggest osteomyelitis. 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 No bony destruction in the calcaneus to suggest evidence for osteomyelitis. Electronically Signed   By: Kennith Center M.D.   On: 03/30/2022 11:23        Scheduled Meds:  amiodarone  200 mg Oral Daily   aspirin EC  81 mg Oral Q breakfast   atorvastatin  80 mg Oral Daily   ferrous sulfate  325 mg Oral BID   fidaxomicin  200 mg Oral BID   heparin  5,000 Units Subcutaneous Q12H   insulin aspart  0-5 Units Subcutaneous QHS   insulin aspart  0-6 Units Subcutaneous TID WC   insulin  pump   Subcutaneous TID WC, HS, 0200   levothyroxine  50 mcg Oral Q0600   linagliptin  5 mg Oral Daily   pregabalin  150 mg Oral BID   ursodiol  300 mg Oral Daily   vortioxetine HBr  20 mg Oral Daily   Continuous Infusions:  furosemide Stopped (04/01/22 0103)     LOS: 2 days    Time spent: over 30 min    Lacretia Nicks, MD [facility]   To contact the attending provider between 7A-7P or the covering provider during after hours 7P-7A, please log into the web site [email or web address] and access using universal [place] password for that web site. If you do not have the password, please call the hospital operator.  04/01/2022, 8:42 AM

## 2022-04-01 NOTE — Progress Notes (Signed)
Patient ID: Susan [name], female   DOB: 04/13/1951, 71 y.o.   MRN: [id number] Patient seen in follow-up status post acute bleed from venous ulceration right leg.  The Unna paste wrap on the right is clean and dry no evidence of any further bleeding.  The 3 layer compression wrap is removed from the left leg.  The venous ulcer on the left leg that was treated with a Kerecis micro graft is approximately a third of its original size.  Photographs were obtained measurements obtained.  Patient has 1 wound that is 20 mm in diameter and the other wound 5 mm in diameter the tissue between these wounds has epithelialized.  Will have a Profore compression wrap applied to the left leg.  I will follow-up in the office in 1 week for reapplication of Kerecis tissue graft.

## 2022-04-01 NOTE — Progress Notes (Signed)
No insulin pump noted, pt. States husband took insulin pump home with him, pt. On sliding scale  Anastasio Auerbach

## 2022-04-01 NOTE — ED Notes (Signed)
MD Sharol Given at bedside

## 2022-04-01 NOTE — Progress Notes (Signed)
  Progress Note    04/01/2022 5:22 PM * No surgery date entered *  Subjective: No overnight issues Vitals:   04/01/22 1149 04/01/22 1353  BP:  (!) 90/56  Pulse:  78  Resp:  20  Temp: 98.4 F (36.9 C) 99.2 F (37.3 C)  SpO2:  96%    Physical Exam: Awake alert oriented Bilateral upper extremity edema 1+ left radial pulse  CBC    Component Value Date/Time   WBC 9.7 04/01/2022 0756   RBC 2.87 (L) 04/01/2022 0756   HGB 7.9 (L) 04/01/2022 1505   HCT 25.2 (L) 04/01/2022 1505   PLT 103 (L) 04/01/2022 0756   MCV 82.2 04/01/2022 0756   MCH 25.4 (L) 04/01/2022 0756   MCHC 30.9 04/01/2022 0756   RDW 17.1 (H) 04/01/2022 0756   LYMPHSABS 0.6 (L) 04/01/2022 0756   MONOABS 1.1 (H) 04/01/2022 0756   EOSABS 0.2 04/01/2022 0756   BASOSABS 0.0 04/01/2022 0756    BMET    Component Value Date/Time   NA 138 04/01/2022 0756   K 3.5 04/01/2022 0756   CL 103 04/01/2022 0756   CO2 19 (L) 04/01/2022 0756   GLUCOSE 190 (H) 04/01/2022 0756   GLUCOSE >444 (H) 06/07/2015 1311   BUN 183 (H) 04/01/2022 0756   CREATININE 7.14 (H) 04/01/2022 0756   CREATININE 6.22 (H) 03/26/2022 1620   CALCIUM 6.9 (L) 04/01/2022 0756   GFRNONAA 6 (L) 04/01/2022 0756   GFRNONAA 11 (L) 11/15/2020 1452   GFRAA 13 (L) 11/15/2020 1452    INR    Component Value Date/Time   INR 1.5 (H) 02/01/2022 0726     Intake/Output Summary (Last 24 hours) at 04/01/2022 1722 Last data filed at 04/01/2022 0103 Gross per 24 hour  Intake 104.04 ml  Output --  Net 104.04 ml     Assessment/plan:  71 y.o. female is here with new diagnosis end-stage renal disease.  Plan will be OR tomorrow with tunneled dialysis catheter placement and left arm AV fistula versus graft.  She will be n.p.o. past midnight.    Sharbel Sahagun C. Donzetta Matters, MD [facility] Office: [phone] Pager: [phone]  04/01/2022 5:22 PM

## 2022-04-01 NOTE — Progress Notes (Signed)
[facility] Progress Note   71 y.o. 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 Hollie Salk with [facility].  She has issues with fluid and salt restriction in the setting of CHF also followed by Dr. Haroldine Laws. Torsemide dose is quite high at '80mg'$  BID and she's not responding well recently. She has had positive urine culture with Enterobacter aerogenes S to quinolones, gentamycin, tobramycin, and bactrim.  Here with bleeding from a wound on the right found noted to be tearing of the skin.  She had UNNA boots but the right one was removed in the ED because of the wound.     Assessment/ Plan:   Acute kidney injury likely secondary to cardiorenal on advanced CKD IV/V with a baseline creatinine in the 3.5-4.2 range in 01/2022 but has been steadily worsening and in the 4.8-5.1 range in 02/2022. She either has progressive CKD but hoping there is a significant component of cardiorenal. Fortunately no absolute indications for dialysis at this time but certainly possible she may need it this hospitalization. She was tearful when I explained this but expressed understanding. - Not a good response to Lasix '120mg'$  -> will stop the Metolazone and Lasix. - Appreciate [facility] Dr. Donzetta Matters seeing the patient and plan is for at least a TC to initiate dialysis tomorrow. Will initiate dialysis when the catheter is in. Patient is uremic today   - 2gm Na diet and fluid restrict to 1L per day -Monitor Daily I/Os, Daily weight  -Maintain MAP>65 for optimal renal perfusion.  - Avoid nephrotoxic agents such as IV contrast, NSAIDs, and phosphate containing bowel preps (FLEETS)   HFrEF with EF 30% in the setting of severe TR more refractory to diuretics at this time. Renal  osteodystrophy - will check a iPTH, phos 7.9 -> start Renvela 2 TIDM. Will need education of higher phos foods to avoid Anemia - check iron panel, transfuse as needed. 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 Severe TR DM  PAF on Eliquis  Subjective:   Nauseous today; denies SOB.   Objective:   BP (!) 108/53   Pulse 75   Temp 98.4 F (36.9 C) (Oral)   Resp 12   SpO2 96%   Intake/Output Summary (Last 24 hours) at 04/01/2022 1307 Last data filed at 04/01/2022 0103 Gross per 24 hour  Intake 104.04 ml  Output --  Net 104.04 ml   Weight change:   Physical Exam: GEN: NAD, sitting at an incline in bed HEENT: EOMI NECK: supple, JVD present RESP:clear bilaterally no c/w/r CV: RRR, I/V systolic murmur today no rub or gallop appreciated ABD: soft, obese, NTND, +BS, no guarding or rebound tenderness LE: 1+ LE edema with a superficial skin tear in the right foot NEURO: no asterixis  Imaging: No results found.  Labs: BMET Recent Labs  Lab 03/26/22 1620 03/30/22 1018 03/31/22 0414 04/01/22 0756  NA 137 136 139 138  K 3.6 3.4* 3.4* 3.5  CL 98 97* 101 103  CO2 21 21* 19* 19*  GLUCOSE 271* 261* 275* 190*  BUN 169* 177* 180* 183*  CREATININE 6.22* 7.14* 7.09* 7.14*  CALCIUM 7.2* 6.9* 6.9* 6.9*  PHOS  --   --   --  7.9*   CBC  Recent Labs  Lab 03/26/22 1620 03/30/22 1018 03/30/22 1720 03/31/22 0414 03/31/22 1528 04/01/22 0756  WBC 7.3 8.4  --  9.8  --  9.7  NEUTROABS  --  6.9  --   --   --  7.7  HGB 9.9* 8.7* 8.9* 7.5* 8.0* 7.3*  HCT 32.2* 28.0* 28.8* 24.1* 25.7* 23.6*  MCV 84.1 84.3  --  83.7  --  82.2  PLT 133* 116*  --  104*  --  103*    Medications:     amiodarone  200 mg Oral Daily   aspirin EC  81 mg Oral Q breakfast   atorvastatin  80 mg Oral Daily   ferrous sulfate  325 mg Oral BID   fidaxomicin  200 mg Oral BID   heparin  5,000 Units Subcutaneous Q12H   insulin aspart  0-5 Units Subcutaneous QHS   insulin aspart  0-6 Units Subcutaneous TID WC    insulin pump   Subcutaneous TID WC, HS, 0200   levothyroxine  50 mcg Oral Q0600   linagliptin  5 mg Oral Daily   pregabalin  150 mg Oral BID   ursodiol  300 mg Oral Daily   vortioxetine HBr  20 mg Oral Daily      Susan Santee, MD 04/01/2022, 1:07 PM

## 2022-04-01 NOTE — ED Notes (Signed)
Pharmacy messaged x 2 to get IVP lasix

## 2022-04-02 ENCOUNTER — Inpatient Hospital Stay (HOSPITAL_COMMUNITY): Payer: HMO | Admitting: Registered Nurse

## 2022-04-02 ENCOUNTER — Inpatient Hospital Stay (HOSPITAL_COMMUNITY): Payer: HMO

## 2022-04-02 ENCOUNTER — Encounter (HOSPITAL_COMMUNITY): Payer: Self-pay | Admitting: Internal Medicine

## 2022-04-02 ENCOUNTER — Encounter (HOSPITAL_COMMUNITY)
Admission: EM | Disposition: A | Discharge status: Home Health | Payer: Self-pay | Source: Home / Self Care | Attending: Family Medicine

## 2022-04-02 ENCOUNTER — Other Ambulatory Visit: Payer: Self-pay

## 2022-04-02 DIAGNOSIS — E11622 Type 2 diabetes mellitus with other skin ulcer: Secondary | ICD-10-CM

## 2022-04-02 DIAGNOSIS — Z87891 Personal history of nicotine dependence: Secondary | ICD-10-CM

## 2022-04-02 DIAGNOSIS — N185 Chronic kidney disease, stage 5: Secondary | ICD-10-CM

## 2022-04-02 DIAGNOSIS — Z794 Long term (current) use of insulin: Secondary | ICD-10-CM

## 2022-04-02 DIAGNOSIS — I132 Hypertensive heart and chronic kidney disease with heart failure and with stage 5 chronic kidney disease, or end stage renal disease: Secondary | ICD-10-CM

## 2022-04-02 DIAGNOSIS — N186 End stage renal disease: Secondary | ICD-10-CM

## 2022-04-02 DIAGNOSIS — N179 Acute kidney failure, unspecified: Secondary | ICD-10-CM | POA: Diagnosis not present

## 2022-04-02 DIAGNOSIS — D631 Anemia in chronic kidney disease: Secondary | ICD-10-CM

## 2022-04-02 DIAGNOSIS — E1122 Type 2 diabetes mellitus with diabetic chronic kidney disease: Secondary | ICD-10-CM

## 2022-04-02 DIAGNOSIS — I252 Old myocardial infarction: Secondary | ICD-10-CM

## 2022-04-02 DIAGNOSIS — I83012 Varicose veins of right lower extremity with ulcer of calf: Secondary | ICD-10-CM | POA: Diagnosis not present

## 2022-04-02 DIAGNOSIS — I509 Heart failure, unspecified: Secondary | ICD-10-CM

## 2022-04-02 DIAGNOSIS — L97219 Non-pressure chronic ulcer of right calf with unspecified severity: Secondary | ICD-10-CM | POA: Diagnosis not present

## 2022-04-02 DIAGNOSIS — I2510 Atherosclerotic heart disease of native coronary artery without angina pectoris: Secondary | ICD-10-CM

## 2022-04-02 HISTORY — PX: INSERTION OF DIALYSIS CATHETER: SHX1324

## 2022-04-02 HISTORY — PX: AV FISTULA PLACEMENT: SHX1204

## 2022-04-02 LAB — GLUCOSE, CAPILLARY
Glucose-Capillary: 130 mg/dL — ABNORMAL HIGH (ref 70–99)
Glucose-Capillary: 134 mg/dL — ABNORMAL HIGH (ref 70–99)
Glucose-Capillary: 137 mg/dL — ABNORMAL HIGH (ref 70–99)
Glucose-Capillary: 134 mg/dL — ABNORMAL HIGH (ref 70–99)
Glucose-Capillary: 314 mg/dL — ABNORMAL HIGH (ref 70–99)

## 2022-04-02 LAB — COMPREHENSIVE METABOLIC PANEL
ALT: 17 U/L (ref 0–44)
AST: 15 U/L (ref 15–41)
Albumin: 2.1 g/dL — ABNORMAL LOW (ref 3.5–5.0)
Alkaline Phosphatase: 96 U/L (ref 38–126)
Anion gap: 17 — ABNORMAL HIGH (ref 5–15)
BUN: 183 mg/dL — ABNORMAL HIGH (ref 8–23)
CO2: 18 mmol/L — ABNORMAL LOW (ref 22–32)
Calcium: 7.1 mg/dL — ABNORMAL LOW (ref 8.9–10.3)
Chloride: 101 mmol/L (ref 98–111)
Creatinine, Ser: 7.42 mg/dL — ABNORMAL HIGH (ref 0.44–1.00)
GFR, Estimated: 5 mL/min — ABNORMAL LOW (ref >60)
Glucose, Bld: 133 mg/dL — ABNORMAL HIGH (ref 70–99)
Potassium: 3.5 mmol/L (ref 3.5–5.1)
Sodium: 136 mmol/L (ref 135–145)
Total Bilirubin: 0.7 mg/dL (ref 0.3–1.2)
Total Protein: 4.9 g/dL — ABNORMAL LOW (ref 6.5–8.1)

## 2022-04-02 LAB — CBC WITH DIFFERENTIAL/PLATELET
Abs Immature Granulocytes: 0.05 10*3/uL (ref 0.00–0.07)
Basophils Absolute: 0 10*3/uL (ref 0.0–0.1)
Basophils Relative: 0 %
Eosinophils Absolute: 0.2 10*3/uL (ref 0.0–0.5)
Eosinophils Relative: 2 %
HCT: 25 % — ABNORMAL LOW (ref 36.0–46.0)
Hemoglobin: 7.8 g/dL — ABNORMAL LOW (ref 12.0–15.0)
Immature Granulocytes: 1 %
Lymphocytes Relative: 5 %
Lymphs Abs: 0.5 10*3/uL — ABNORMAL LOW (ref 0.7–4.0)
MCH: 25.7 pg — ABNORMAL LOW (ref 26.0–34.0)
MCHC: 31.2 g/dL (ref 30.0–36.0)
MCV: 82.5 fL (ref 80.0–100.0)
Monocytes Absolute: 1.2 10*3/uL — ABNORMAL HIGH (ref 0.1–1.0)
Monocytes Relative: 12 %
Neutro Abs: 8.3 10*3/uL — ABNORMAL HIGH (ref 1.7–7.7)
Neutrophils Relative %: 80 %
Platelets: 107 10*3/uL — ABNORMAL LOW (ref 150–400)
RBC: 3.03 MIL/uL — ABNORMAL LOW (ref 3.87–5.11)
RDW: 17 % — ABNORMAL HIGH (ref 11.5–15.5)
WBC: 10.2 10*3/uL (ref 4.0–10.5)
nRBC: 0 % (ref 0.0–0.2)

## 2022-04-02 LAB — FOLATE: Folate: 14 ng/mL (ref >5.9)

## 2022-04-02 LAB — VITAMIN B12: Vitamin B-12: 710 pg/mL (ref 180–914)

## 2022-04-02 LAB — MAGNESIUM: Magnesium: 2.2 mg/dL (ref 1.7–2.4)

## 2022-04-02 LAB — PHOSPHORUS: Phosphorus: 8.7 mg/dL — ABNORMAL HIGH (ref 2.5–4.6)

## 2022-04-02 LAB — HEPATITIS B SURFACE ANTIBODY, QUANTITATIVE: Hep B S AB Quant (Post): 7.2 m[IU]/mL — ABNORMAL LOW (ref >9.9)

## 2022-04-02 SURGERY — ARTERIOVENOUS (AV) FISTULA CREATION
Anesthesia: General | Site: Chest | Laterality: Right

## 2022-04-02 MED ORDER — HEPARIN SODIUM (PORCINE) 1000 UNIT/ML IJ SOLN
INTRAMUSCULAR | Status: AC
  Filled 2022-04-02: qty 4

## 2022-04-02 MED ORDER — HEPARIN SODIUM (PORCINE) 1000 UNIT/ML IJ SOLN
INTRAMUSCULAR | Status: AC
  Filled 2022-04-02: qty 10

## 2022-04-02 MED ORDER — SODIUM CHLORIDE 0.9 % IV SOLN
250.0000 mg | Freq: Every day | INTRAVENOUS | Status: AC
  Administered 2022-04-02 – 2022-04-03 (×2): 250 mg via INTRAVENOUS
  Filled 2022-04-02 (×2): qty 20

## 2022-04-02 MED ORDER — HEPARIN SODIUM (PORCINE) 1000 UNIT/ML DIALYSIS
1000.0000 [IU] | INTRAMUSCULAR | Status: DC | PRN
  Administered 2022-04-02: 1000 [IU]

## 2022-04-02 MED ORDER — PROPOFOL 10 MG/ML IV BOLUS
INTRAVENOUS | Status: DC | PRN
  Administered 2022-04-02: 50 mg via INTRAVENOUS

## 2022-04-02 MED ORDER — OXYCODONE HCL 5 MG/5ML PO SOLN: 5.0000 mg | Freq: Once | ORAL | Status: DC | PRN

## 2022-04-02 MED ORDER — HEPARIN SODIUM (PORCINE) 1000 UNIT/ML IJ SOLN
INTRAMUSCULAR | Status: DC | PRN
  Administered 2022-04-02: 2800 [IU]

## 2022-04-02 MED ORDER — ONDANSETRON HCL 4 MG/2ML IJ SOLN
INTRAMUSCULAR | Status: DC | PRN
  Administered 2022-04-02: 4 mg via INTRAVENOUS

## 2022-04-02 MED ORDER — LIDOCAINE 2% (20 MG/ML) 5 ML SYRINGE
INTRAMUSCULAR | Status: DC | PRN
  Administered 2022-04-02: 60 mg via INTRAVENOUS

## 2022-04-02 MED ORDER — BUPIVACAINE LIPOSOME 1.3 % IJ SUSP
INTRAMUSCULAR | Status: AC
  Filled 2022-04-02: qty 20

## 2022-04-02 MED ORDER — ACETAMINOPHEN 500 MG PO TABS
1000.0000 mg | ORAL_TABLET | Freq: Once | ORAL | Status: AC
  Administered 2022-04-02: 1000 mg via ORAL
  Filled 2022-04-02: qty 2

## 2022-04-02 MED ORDER — FENTANYL CITRATE (PF) 100 MCG/2ML IJ SOLN
INTRAMUSCULAR | Status: AC
  Filled 2022-04-02: qty 2

## 2022-04-02 MED ORDER — PROPOFOL 10 MG/ML IV BOLUS
INTRAVENOUS | Status: AC
  Filled 2022-04-02: qty 20

## 2022-04-02 MED ORDER — HEPARIN 6000 UNIT IRRIGATION SOLUTION
Status: DC | PRN
  Administered 2022-04-02: 1

## 2022-04-02 MED ORDER — SODIUM CHLORIDE 0.9 % IV SOLN: INTRAVENOUS | Status: DC

## 2022-04-02 MED ORDER — AMISULPRIDE (ANTIEMETIC) 5 MG/2ML IV SOLN
INTRAVENOUS | Status: AC
  Filled 2022-04-02: qty 4

## 2022-04-02 MED ORDER — CHLORHEXIDINE GLUCONATE 0.12 % MT SOLN: 15.0000 mL | Freq: Once | OROMUCOSAL | Status: DC

## 2022-04-02 MED ORDER — 0.9 % SODIUM CHLORIDE (POUR BTL) OPTIME
TOPICAL | Status: DC | PRN
  Administered 2022-04-02: 1000 mL

## 2022-04-02 MED ORDER — ONDANSETRON HCL 4 MG/2ML IJ SOLN: 4.0000 mg | Freq: Once | INTRAMUSCULAR | Status: DC | PRN

## 2022-04-02 MED ORDER — MIDAZOLAM HCL 2 MG/2ML IJ SOLN
INTRAMUSCULAR | Status: AC
  Filled 2022-04-02: qty 2

## 2022-04-02 MED ORDER — AMISULPRIDE (ANTIEMETIC) 5 MG/2ML IV SOLN
10.0000 mg | Freq: Once | INTRAVENOUS | Status: AC | PRN
  Administered 2022-04-02: 10 mg via INTRAVENOUS

## 2022-04-02 MED ORDER — OXYCODONE HCL 5 MG PO TABS: 5.0000 mg | ORAL_TABLET | Freq: Once | ORAL | Status: DC | PRN

## 2022-04-02 MED ORDER — CHLORHEXIDINE GLUCONATE 0.12 % MT SOLN
OROMUCOSAL | Status: AC
  Filled 2022-04-02: qty 15

## 2022-04-02 MED ORDER — ORAL CARE MOUTH RINSE: 15.0000 mL | Freq: Once | OROMUCOSAL | Status: DC

## 2022-04-02 MED ORDER — EPHEDRINE SULFATE-NACL 50-0.9 MG/10ML-% IV SOSY
PREFILLED_SYRINGE | INTRAVENOUS | Status: DC | PRN
  Administered 2022-04-02 (×3): 5 mg via INTRAVENOUS

## 2022-04-02 MED ORDER — LIDOCAINE-EPINEPHRINE (PF) 1 %-1:200000 IJ SOLN
INTRAMUSCULAR | Status: AC
  Filled 2022-04-02: qty 30

## 2022-04-02 MED ORDER — FENTANYL CITRATE (PF) 100 MCG/2ML IJ SOLN: 25.0000 ug | INTRAMUSCULAR | Status: DC | PRN

## 2022-04-02 MED ORDER — FENTANYL CITRATE (PF) 250 MCG/5ML IJ SOLN
INTRAMUSCULAR | Status: AC
  Filled 2022-04-02: qty 5

## 2022-04-02 MED ORDER — FENTANYL CITRATE (PF) 250 MCG/5ML IJ SOLN
INTRAMUSCULAR | Status: DC | PRN
  Administered 2022-04-02: 50 ug via INTRAVENOUS

## 2022-04-02 MED ORDER — LIDOCAINE HCL (PF) 1 % IJ SOLN
INTRAMUSCULAR | Status: AC
  Filled 2022-04-02: qty 30

## 2022-04-02 MED ORDER — DEXAMETHASONE SODIUM PHOSPHATE 10 MG/ML IJ SOLN
INTRAMUSCULAR | Status: DC | PRN
  Administered 2022-04-02: 4 mg via INTRAVENOUS

## 2022-04-02 MED ORDER — ETOMIDATE 2 MG/ML IV SOLN
INTRAVENOUS | Status: DC | PRN
  Administered 2022-04-02: 10 mg via INTRAVENOUS

## 2022-04-02 MED ORDER — INSULIN ASPART 100 UNIT/ML IJ SOLN: 0.0000 [IU] | INTRAMUSCULAR | Status: DC | PRN

## 2022-04-02 MED ORDER — DARBEPOETIN ALFA 100 MCG/0.5ML IJ SOSY
100.0000 ug | PREFILLED_SYRINGE | INTRAMUSCULAR | Status: DC
  Administered 2022-04-03: 100 ug via SUBCUTANEOUS
  Filled 2022-04-02: qty 0.5

## 2022-04-02 MED ORDER — HEPARIN 6000 UNIT IRRIGATION SOLUTION
Status: AC
  Filled 2022-04-02: qty 500

## 2022-04-02 SURGICAL SUPPLY — 48 items
ARMBAND PINK RESTRICT EXTREMIT (MISCELLANEOUS) ×4 IMPLANT
BAG COUNTER SPONGE SURGICOUNT (BAG) ×2 IMPLANT
BAG DECANTER FOR FLEXI CONT (MISCELLANEOUS) ×2 IMPLANT
BIOPATCH RED 1 DISK 7.0 (GAUZE/BANDAGES/DRESSINGS) ×2 IMPLANT
CANISTER SUCT 3000ML PPV (MISCELLANEOUS) ×2 IMPLANT
CATH PALINDROME-P 23CM W/VT (CATHETERS) IMPLANT
CLIP VESOCCLUDE MED 6/CT (CLIP) ×2 IMPLANT
CLIP VESOCCLUDE SM WIDE 6/CT (CLIP) ×2 IMPLANT
COVER PROBE W GEL 5X96 (DRAPES) ×2 IMPLANT
COVER SURGICAL LIGHT HANDLE (MISCELLANEOUS) ×2 IMPLANT
DERMABOND ADVANCED .7 DNX12 (GAUZE/BANDAGES/DRESSINGS) ×2 IMPLANT
DRAPE C-ARM 42X72 X-RAY (DRAPES) ×2 IMPLANT
DRAPE CHEST BREAST 15X10 FENES (DRAPES) ×2 IMPLANT
DRSG COVADERM 4X6 (GAUZE/BANDAGES/DRESSINGS) IMPLANT
ELECT REM PT RETURN 9FT ADLT (ELECTROSURGICAL) ×2
ELECTRODE REM PT RTRN 9FT ADLT (ELECTROSURGICAL) ×2 IMPLANT
GAUZE 4X4 16PLY ~~LOC~~+RFID DBL (SPONGE) ×2 IMPLANT
GLOVE SURG SS PI 7.5 STRL IVOR (GLOVE) ×6 IMPLANT
GOWN STRL REUS W/ TWL LRG LVL3 (GOWN DISPOSABLE) ×4 IMPLANT
GOWN STRL REUS W/ TWL XL LVL3 (GOWN DISPOSABLE) ×2 IMPLANT
GOWN STRL REUS W/TWL LRG LVL3 (GOWN DISPOSABLE) ×4
GOWN STRL REUS W/TWL XL LVL3 (GOWN DISPOSABLE) ×2
KIT BASIN OR (CUSTOM PROCEDURE TRAY) ×2 IMPLANT
KIT TURNOVER KIT B (KITS) ×2 IMPLANT
NDL 18GX1X1/2 (RX/OR ONLY) (NEEDLE) ×2 IMPLANT
NDL HYPO 25GX1X1/2 BEV (NEEDLE) ×2 IMPLANT
NEEDLE 18GX1X1/2 (RX/OR ONLY) (NEEDLE) ×2 IMPLANT
NEEDLE HYPO 25GX1X1/2 BEV (NEEDLE) ×2 IMPLANT
NS IRRIG 1000ML POUR BTL (IV SOLUTION) ×2 IMPLANT
PACK BASIC III (CUSTOM PROCEDURE TRAY) ×2
PACK CV ACCESS (CUSTOM PROCEDURE TRAY) ×2 IMPLANT
PACK SRG BSC III STRL LF ECLPS (CUSTOM PROCEDURE TRAY) ×2 IMPLANT
PAD ARMBOARD 7.5X6 YLW CONV (MISCELLANEOUS) ×4 IMPLANT
SET MICROPUNCTURE 5F STIFF (MISCELLANEOUS) IMPLANT
SOAP 2 % CHG 4 OZ (WOUND CARE) ×2 IMPLANT
SUT ETHILON 3 0 PS 1 (SUTURE) IMPLANT
SUT PROLENE 6 0 BV (SUTURE) IMPLANT
SUT VIC AB 3-0 SH 27 (SUTURE) ×2
SUT VIC AB 3-0 SH 27X BRD (SUTURE) IMPLANT
SUT VIC AB 4-0 PS2 18 (SUTURE) IMPLANT
SYR 10ML LL (SYRINGE) ×2 IMPLANT
SYR 20ML LL LF (SYRINGE) ×4 IMPLANT
SYR 5ML LL (SYRINGE) ×2 IMPLANT
SYR CONTROL 10ML LL (SYRINGE) ×2 IMPLANT
TOWEL GREEN STERILE (TOWEL DISPOSABLE) ×4 IMPLANT
TOWEL GREEN STERILE FF (TOWEL DISPOSABLE) ×2 IMPLANT
UNDERPAD 30X36 HEAVY ABSORB (UNDERPADS AND DIAPERS) ×2 IMPLANT
WATER STERILE IRR 1000ML POUR (IV SOLUTION) ×2 IMPLANT

## 2022-04-02 NOTE — Anesthesia Procedure Notes (Signed)
Procedure Name: LMA Insertion Date/Time: 04/02/2022 12:07 PM  Performed by: Trinna Post., CRNAPre-anesthesia Checklist: Patient identified, Emergency Drugs available, Suction available, Patient being monitored and Timeout performed Patient Re-evaluated:Patient Re-evaluated prior to induction Oxygen Delivery Method: Circle system utilized Preoxygenation: Pre-oxygenation with 100% oxygen Induction Type: IV induction LMA Size: 4.0 Number of attempts: 1 Placement Confirmation: positive ETCO2 and breath sounds checked- equal and bilateral Tube secured with: Tape Dental Injury: Teeth and Oropharynx as per pre-operative assessment

## 2022-04-02 NOTE — Progress Notes (Signed)
  Transition of Care [facility]) Screening Note   Patient Details  Name: Tanny Harnack Date of Birth: [birth year]   [facility]) CM/SW Contact:    Tom-Johnson, Renea Ee, RN Phone Number: 04/02/2022, 3:55 PM  Transition of Care Department [facility]) has reviewed patient and no TOC needs or recommendations have been identified at this time. TOC will continue to monitor patient advancement through interdisciplinary progression rounds. If new patient transition needs arise, please place a TOC consult.

## 2022-04-02 NOTE — Transfer of Care (Signed)
Immediate Anesthesia Transfer of Care Note  Patient: Susan [name]  Procedure(s) Performed: LEFT ARM ARTERIOVENOUS (AV) FISTULA CREATION (Left: Arm Upper) INSERTION OF TUNNELED DIALYSIS CATHETER (Right: Chest)  Patient Location: PACU  Anesthesia Type:General  Level of Consciousness: awake and drowsy  Airway & Oxygen Therapy: Patient Spontanous Breathing and Patient connected to face mask oxygen  Post-op Assessment: Report given to RN and Post -op Vital signs reviewed and stable  Post vital signs: Reviewed and stable  Last Vitals:  Vitals Value Taken Time  BP    Temp    Pulse 64 04/02/22 1416  Resp 10 04/02/22 1416  SpO2 99 % 04/02/22 1416  Vitals shown include unvalidated device data.  Last Pain:  Vitals:   04/02/22 1026  TempSrc:   PainSc: 0-No pain         Complications: No notable events documented.

## 2022-04-02 NOTE — Anesthesia Preprocedure Evaluation (Addendum)
Anesthesia Evaluation  Patient identified by MRN, date of birth, ID band Patient awake    Reviewed: Allergy & Precautions, NPO status , Patient's Chart, lab work & pertinent test results  History of Anesthesia Complications Negative for: history of anesthetic complications  Airway Mallampati: III  TM Distance: >3 FB Neck ROM: Full    Dental  (+) Teeth Intact, Dental Advisory Given   Pulmonary former smoker   Pulmonary exam normal        Cardiovascular hypertension, pulmonary hypertension+ CAD, + Past MI, + Peripheral Vascular Disease and +CHF  Normal cardiovascular exam+ dysrhythmias Atrial Fibrillation   TEE 02/04/22: EF 30%, mod reduced RVSF, severe TR   Neuro/Psych   Anxiety Depression    CVA    GI/Hepatic Neg liver ROS,GERD  ,,  Endo/Other  diabetes, Type 2Hypothyroidism  Morbid obesity  Renal/GU ESRFRenal disease  negative genitourinary   Musculoskeletal negative musculoskeletal ROS (+)    Abdominal   Peds  Hematology  (+) Blood dyscrasia, anemia   Anesthesia Other Findings   Reproductive/Obstetrics                             Anesthesia Physical Anesthesia Plan  ASA: 4  Anesthesia Plan: General   Post-op Pain Management: Tylenol PO (pre-op)*   Induction: Intravenous  PONV Risk Score and Plan: 1 and Ondansetron, Dexamethasone and Treatment may vary due to age or medical condition  Airway Management Planned: LMA  Additional Equipment: None  Intra-op Plan:   Post-operative Plan: Extubation in OR  Informed Consent: I have reviewed the patients History and Physical, chart, labs and discussed the procedure including the risks, benefits and alternatives for the proposed anesthesia with the patient or authorized representative who has indicated his/her understanding and acceptance.     Dental advisory given  Plan Discussed with:   Anesthesia Plan Comments:          Anesthesia Quick Evaluation

## 2022-04-02 NOTE — Plan of Care (Signed)

## 2022-04-02 NOTE — Progress Notes (Signed)
PROGRESS NOTE    Susan [name]  ZOX:[id number] DOB: Oct 08, 1950 DOA: 03/30/2022 PCP: [name], MD  Chief Complaint  Patient presents with   Wound Check    Brief Narrative:  Susan [name] is a 71 y.o. female with medical history significant of PAF on Eliquis, CKD stage IV, CAD, chronic HFpEF, morbid obesity, IIDM, chronic venous stasis wound on right leg, recent recurrent C. difficile colitis, recurrent UTIs, presented with bleeding from the right leg venous stasis ulcer.   Hospitalization also complicated by worsening renal function with uremia.  Renal following, planning for trial of lasix and likely will need dialysis.  Vascular has been consulted for access + TDC.   Assessment & Plan:   Principal Problem:   AKI (acute kidney injury) ([place]) Active Problems:   Venous stasis ulcer of right calf (HCC)   Assessment and Plan: Acute Kidney Injury on CKD IV or V  Uremia Baseline creatinine appears to be ~3-5 over the past year or so, recently worsening with creatinine 7.09 + asterixis, mild confusion she reports for the past week or so Appreciate renal assistance, diuretic challenge with 120 mg lasix q8  Unfortunately, she did not respond and decision made to initiate dialysis Seen by vascular surgery and underwent AV fistula and TDC placement on 11/8  Chronic HFrEF (EF 30%) - Echo 01/2022 TEE with EF 30%, RVSF moderately reduced (see report) - case discussed by Dr. Augustin Coupe with Dr. Aundra Dubin of advanced HF service who did not believe patient would benefit from inotropes and not candidate for home inotropes.  Right leg venous ulcer bleeding  Bleeding from Varicose Veins secondary to Anticoagulation therapy for Atrial Fibrillation -holding eliquis for now -s/p DDAVP in setting of her uremia -appreciate orthopedic assistance, recommending UNNA boots and prafo boot for right, bleeding appears to have subsided -left leg ulcer treated with Kerecis micro graft  -follow up with ortho next  week for reapplication Kerecis tissue graft left leg   Acute Blood Loss Anemia Stable, but fluctuating Follow type and screen, repeat H/H Transfuse prn    PAF - Hold off Eliquis due to bleeding above.  - Continue amiodarone   HTN -Blood pressure borderline low, hold off home BP meds, start as needed hydralazine -low threshold for transfusion   Recurrent C. difficile colitis -Continue for Fidaxomycin -saw ID on 11/1, was prescribed fidaxomicin on 11/3 for her C difficile    Concern for Colovesical Fistula - discussed with urology, consider CT cystogram while she's here, will defer for now until renal function/volume status have stabilized  IDDM hyperglycemia -has insulin pump, protocol for patient administration ordered  - pump discontinued 11/7 -started on basal/bolus insulin -would prefer transitioning back to pump when able -also started on linagliptin   History of recurrent UTI -On chronic suppression antibiotics, but we will hold off MicroBid for now   Gout -Hold off colchicine due to AKI    DVT prophylaxis: heparin Code Status: full Family Communication: none Disposition:   Status is: Inpatient Remains inpatient appropriate because: pending further improvement   Consultants:  Renal Ortho Vascular surgery  Procedures:  11/8 Left arm AV fistula  11/8 TDC placement  Antimicrobials:  Anti-infectives (From admission, onward)    Start     Dose/Rate Route Frequency Ordered Stop   04/02/22 0000  vancomycin (VANCOCIN) IVPB 1000 mg/200 mL premix        1,000 mg 200 mL/hr over 60 Minutes Intravenous To Surgery 04/01/22 1513 04/02/22 1057   03/30/22 2200  fidaxomicin (  DIFICID) tablet 200 mg        200 mg Oral 2 times daily 03/30/22 1759 04/06/22 2159       Subjective: Patient seen in her room post operatively. She is sleeping but wakes up to voice. She denies any complaints at this time  Objective: Vitals:   04/02/22 1415 04/02/22 1430 04/02/22 1445  04/02/22 1514  BP: (!) 107/52 (!) 108/55 108/73 120/62  Pulse: 65 65 65 68  Resp: 10 (!) '9 10 17  '$ Temp: 97.9 F (36.6 C)  97.9 F (36.6 C) 97.9 F (36.6 C)  TempSrc:    Oral  SpO2: 93% 90% 94% 92%  Weight:      Height:        Intake/Output Summary (Last 24 hours) at 04/02/2022 1912 Last data filed at 04/02/2022 1700 Gross per 24 hour  Intake 860 ml  Output 710 ml  Net 150 ml   Filed Weights   04/01/22 1353  Weight: 126.7 kg    Examination:  General exam: Alert, awake, oriented x 3 Respiratory system: Clear to auscultation. Respiratory effort normal., HD cath present in right chest Cardiovascular system:RRR. No murmurs, rubs, gallops. Gastrointestinal system: Abdomen is nondistended, soft and nontender. No organomegaly or masses felt. Normal bowel sounds heard. Central nervous system: Alert and oriented. No focal neurological deficits. Extremities: RLE in unna boot Skin: No rashes, lesions or ulcers Psychiatry: Judgement and insight appear normal. Mood & affect appropriate.       Data Reviewed: I have personally reviewed following labs and imaging studies  CBC: Recent Labs  Lab 03/30/22 1018 03/30/22 1720 03/31/22 0414 03/31/22 1528 04/01/22 0756 04/01/22 1505 04/02/22 0703  WBC 8.4  --  9.8  --  9.7  --  10.2  NEUTROABS 6.9  --   --   --  7.7  --  8.3*  HGB 8.7*   < > 7.5* 8.0* 7.3* 7.9* 7.8*  HCT 28.0*   < > 24.1* 25.7* 23.6* 25.2* 25.0*  MCV 84.3  --  83.7  --  82.2  --  82.5  PLT 116*  --  104*  --  103*  --  107*   < > = values in this interval not displayed.    Basic Metabolic Panel: Recent Labs  Lab 03/30/22 1018 03/31/22 0414 04/01/22 0756 04/02/22 0703  NA 136 139 138 136  K 3.4* 3.4* 3.5 3.5  CL 97* 101 103 101  CO2 21* 19* 19* 18*  GLUCOSE 261* 275* 190* 133*  BUN 177* 180* 183* 183*  CREATININE 7.14* 7.09* 7.14* 7.42*  CALCIUM 6.9* 6.9* 6.9* 7.1*  MG  --   --  2.2 2.2  PHOS  --   --  7.9* 8.7*    GFR: Estimated Creatinine  Clearance: 9.2 mL/min (A) (by C-G formula based on SCr of 7.42 mg/dL (H)).  Liver Function Tests: Recent Labs  Lab 04/01/22 0756 04/02/22 0703  AST 14* 15  ALT 15 17  ALKPHOS 92 96  BILITOT 0.6 0.7  PROT 5.0* 4.9*  ALBUMIN 2.1* 2.1*    CBG: Recent Labs  Lab 04/01/22 2026 04/02/22 0718 04/02/22 1007 04/02/22 1418 04/02/22 1515  GLUCAP 154* 130* 134* 137* 134*     No results found for this or any previous visit (from the past 240 hour(s)).       Radiology Studies: DG Chest Port 1 View  Result Date: 04/02/2022 CLINICAL DATA:  Dialysis catheter insertion EXAM: PORTABLE CHEST 1 VIEW COMPARISON:  02/01/2022  FINDINGS: Right internal jugular catheter in place with the tip at the SVC RA junction. No pneumothorax. Cardiomegaly. Aortic atherosclerosis. Possible venous hypertension but without frank edema. No infiltrate, collapse or effusion. IMPRESSION: 1. Right internal jugular catheter tip at the SVC RA junction. No pneumothorax. 2. Cardiomegaly. Possible venous hypertension. Electronically Signed   By: Nelson Chimes M.D.   On: 04/02/2022 15:15   DG C-Arm 1-60 Min  Result Date: 04/02/2022 CLINICAL DATA:  ESRD.  Dialysis catheter insertion EXAM: DG C-ARM 1-60 MIN COMPARISON:  Chest XR, 02/01/2022 FLUOROSCOPY: Exposure Index (as provided by the fluoroscopic device): 2.94 MGy Kerma FINDINGS: Single, limited planar imaging of the RIGHT chest. Image demonstrating RIGHT IJ approach tunneled dialysis catheter placement. Tip not fully imaged, though appears to be within RIGHT atrium. IMPRESSION: Fluoroscopic imaging for RIGHT tunneled dialysis catheter placement. For complete description of intra procedural findings, please see performing service dictation. Electronically Signed   By: Michaelle Birks M.D.   On: 04/02/2022 14:24   VAS [name] UPPER EXT VEIN MAPPING (PRE-OP AVF)  Result Date: 04/01/2022 UPPER EXTREMITY VEIN MAPPING Patient Name:  CATORI PANOZZO  Date of Exam:   04/01/2022 Medical Rec #:  [id number]     Accession #:    [id number] Date of Birth: July 21, 1950     Patient Gender: F Patient Age:   55 years Exam Location:  [facility] Procedure:      VAS [name] UPPER EXT VEIN MAPPING (PRE-OP AVF) Referring Phys: EMMA COLLINS --------------------------------------------------------------------------------  Indications: Pre-access. History: CRD stage IV, CAD, chronic HF pEF, morbid obesity, IIDM.  Performing Technologist: Oda Cogan RDMS, RVT  Examination Guidelines: A complete evaluation includes B-mode imaging, spectral Doppler, color Doppler, and power Doppler as needed of all accessible portions of each vessel. Bilateral testing is considered an integral part of a complete examination. Limited examinations for reoccurring indications may be performed as noted. +-----------------+-------------+----------+---------+ Right Cephalic   Diameter (cm)Depth (cm)Findings  +-----------------+-------------+----------+---------+ Shoulder             0.29        0.97             +-----------------+-------------+----------+---------+ Prox upper arm       0.28        1.70   branching +-----------------+-------------+----------+---------+ Mid upper arm        0.21        1.71             +-----------------+-------------+----------+---------+ Dist upper arm       0.21        1.24             +-----------------+-------------+----------+---------+ Antecubital fossa    0.48        0.49             +-----------------+-------------+----------+---------+ Prox forearm         0.22        0.80             +-----------------+-------------+----------+---------+ Mid forearm          0.23        0.79             +-----------------+-------------+----------+---------+ Dist forearm         0.11                         +-----------------+-------------+----------+---------+ +-----------------+-------------+----------+--------+ Right Basilic    Diameter (cm)Depth (cm)Findings  +-----------------+-------------+----------+--------+ Prox upper arm  0.46                        +-----------------+-------------+----------+--------+ Mid upper arm        0.47                        +-----------------+-------------+----------+--------+ Dist upper arm       0.43                        +-----------------+-------------+----------+--------+ Antecubital fossa    0.49                        +-----------------+-------------+----------+--------+ Prox forearm         0.19                        +-----------------+-------------+----------+--------+ Mid forearm          0.25                        +-----------------+-------------+----------+--------+ Distal forearm       0.20                        +-----------------+-------------+----------+--------+ +-----------------+-------------+----------+---------+ Left Cephalic    Diameter (cm)Depth (cm)Findings  +-----------------+-------------+----------+---------+ Shoulder             0.35        1.30             +-----------------+-------------+----------+---------+ Prox upper arm       0.37        1.30             +-----------------+-------------+----------+---------+ Mid upper arm        0.31        0.87             +-----------------+-------------+----------+---------+ Dist upper arm       0.27        0.56             +-----------------+-------------+----------+---------+ Antecubital fossa    0.31               branching +-----------------+-------------+----------+---------+ Prox forearm         0.22        0.77             +-----------------+-------------+----------+---------+ Mid forearm          0.11                         +-----------------+-------------+----------+---------+ Dist forearm         0.12                         +-----------------+-------------+----------+---------+ +-----------------+-------------+----------+--------------------+ Left Basilic     Diameter  (cm)Depth (cm)      Findings       +-----------------+-------------+----------+--------------------+ Prox upper arm       0.66                                    +-----------------+-------------+----------+--------------------+ Mid upper arm        0.38                                    +-----------------+-------------+----------+--------------------+  Dist upper arm       0.33                                    +-----------------+-------------+----------+--------------------+ Antecubital fossa    0.28                    branching       +-----------------+-------------+----------+--------------------+ Prox forearm         0.25                                    +-----------------+-------------+----------+--------------------+ Mid forearm          0.24               vein tapers anterior +-----------------+-------------+----------+--------------------+ Distal forearm       0.23                    branching       +-----------------+-------------+----------+--------------------+ Elbow                0.18                                    +-----------------+-------------+----------+--------------------+ *See table(s) above for measurements and observations.  Diagnosing physician: Deitra Mayo MD Electronically signed by Deitra Mayo MD on 04/01/2022 at 6:09:43 PM.    Final         Scheduled Meds:  amiodarone  200 mg Oral Daily   amisulpride       aspirin EC  81 mg Oral Q breakfast   atorvastatin  80 mg Oral Daily   Chlorhexidine Gluconate Cloth  6 each Topical Q0600   [START ON 04/03/2022] darbepoetin (ARANESP) injection - DIALYSIS  100 mcg Subcutaneous Q Thu-1800   fidaxomicin  200 mg Oral BID   heparin  5,000 Units Subcutaneous Q12H   insulin aspart  0-15 Units Subcutaneous TID WC   insulin aspart  0-5 Units Subcutaneous QHS   insulin glargine-yfgn  17 Units Subcutaneous QHS   levothyroxine  50 mcg Oral Q0600   linagliptin  5 mg Oral Daily    pregabalin  150 mg Oral BID   sevelamer carbonate  1,600 mg Oral TID WC   ursodiol  300 mg Oral Daily   vortioxetine HBr  20 mg Oral Daily   Continuous Infusions:  ferric gluconate (FERRLECIT) IVPB 250 mg (04/02/22 1616)     LOS: 3 days    Time spent: over 32 min    Kathie Dike, MD [facility]   To contact the attending provider between 7A-7P or the covering provider during after hours 7P-7A, please log into the web site [email or web address] and access using universal [place] password for that web site. If you do not have the password, please call the hospital operator.  04/02/2022, 7:12 PM

## 2022-04-02 NOTE — Progress Notes (Signed)
[facility] Progress Note   71 y.o. 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 Hollie Salk with [facility].  She has issues with fluid and salt restriction in the setting of CHF also followed by Dr. Haroldine Laws. Torsemide dose is quite high at '80mg'$  BID and she's not responding well recently. She has had positive urine culture with Enterobacter aerogenes S to quinolones, gentamycin, tobramycin, and bactrim.  Here with bleeding from a wound on the right found noted to be tearing of the skin.  She had UNNA boots but the right one was removed in the ED because of the wound.     Assessment/ Plan:   Acute kidney injury likely secondary to cardiorenal on advanced CKD IV/V with a baseline creatinine in the 3.5-4.2 range in 01/2022 but has been steadily worsening and in the 4.8-5.1 range in 02/2022. She either has progressive CKD but hoping there is a significant component of cardiorenal. Fortunately no absolute indications for dialysis at this time but certainly possible she may need it this hospitalization. She was tearful when I explained this but expressed understanding. - Not a good response to Lasix '120mg'$  -> stopped the Metolazone and Lasix. - Appreciate [facility] Dr. Donzetta Matters seeing the patient and plan is for at least a TC to initiate dialysis today and possible permanent access as well. Will initiate dialysis when the catheter is in. Patient is certainly uremic   - 2gm Na diet and fluid restrict to 1L per day -Monitor Daily I/Os, Daily weight  -Maintain MAP>65 for optimal renal perfusion.  - Avoid nephrotoxic agents such as IV contrast, NSAIDs, and phosphate containing bowel preps (FLEETS)   HFrEF with EF 30% in the setting of severe TR more refractory  to diuretics at this time. Renal osteodystrophy - will check a iPTH, phos 7.9 -> started Renvela 2 TIDM but not eating much w/ the nausea. Will need education of higher phos foods to avoid Anemia - 7% sats (needs iron, will load with Ferrlecit '250mg'$  x2 given she received 2 units on 11/6). 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 Severe TR DM  PAF on Eliquis  Subjective:   Nauseous today; denies SOB.   Objective:   BP 128/79 (BP Location: Left Arm)   Pulse 73   Temp 98.7 F (37.1 C) (Oral)   Resp 18   Ht '5\' 5"'$  (1.651 m)   Wt 126.7 kg   SpO2 96%   BMI 46.48 kg/m   Intake/Output Summary (Last 24 hours) at 04/02/2022 0749 Last data filed at 04/02/2022 5956 Gross per 24 hour  Intake 480 ml  Output 700 ml  Net -220 ml   Weight change:   Physical Exam: GEN: NAD, sitting at an incline in bed HEENT: EOMI NECK: supple, JVD present RESP:clear bilaterally no c/w/r CV: RRR, I/V systolic murmur today no rub or gallop appreciated ABD: soft, obese, NTND, +BS, no guarding or rebound tenderness LE: 1+ LE edema with a superficial skin tear in the right foot NEURO: no asterixis  Imaging: VAS [name] UPPER EXT VEIN MAPPING (PRE-OP AVF)  Result Date: 04/01/2022 UPPER EXTREMITY VEIN MAPPING Patient Name:  Susan [name]  Date of Exam:   04/01/2022 Medical Rec #: [id number]     Accession #:  [id number] Date of Birth: March 06, 1951     Patient Gender: F Patient Age:   18 years Exam Location:  [facility] Procedure:      VAS [name] UPPER EXT VEIN MAPPING (PRE-OP AVF) Referring Phys: EMMA COLLINS --------------------------------------------------------------------------------  Indications: Pre-access. History: CRD stage IV, CAD, chronic HF pEF, morbid obesity, IIDM.  Performing Technologist: Oda Cogan RDMS, RVT  Examination Guidelines: A complete evaluation includes B-mode imaging, spectral Doppler, color Doppler, and power Doppler as needed of all accessible portions of each vessel. Bilateral testing  is considered an integral part of a complete examination. Limited examinations for reoccurring indications may be performed as noted. +-----------------+-------------+----------+---------+ Right Cephalic   Diameter (cm)Depth (cm)Findings  +-----------------+-------------+----------+---------+ Shoulder             0.29        0.97             +-----------------+-------------+----------+---------+ Prox upper arm       0.28        1.70   branching +-----------------+-------------+----------+---------+ Mid upper arm        0.21        1.71             +-----------------+-------------+----------+---------+ Dist upper arm       0.21        1.24             +-----------------+-------------+----------+---------+ Antecubital fossa    0.48        0.49             +-----------------+-------------+----------+---------+ Prox forearm         0.22        0.80             +-----------------+-------------+----------+---------+ Mid forearm          0.23        0.79             +-----------------+-------------+----------+---------+ Dist forearm         0.11                         +-----------------+-------------+----------+---------+ +-----------------+-------------+----------+--------+ Right Basilic    Diameter (cm)Depth (cm)Findings +-----------------+-------------+----------+--------+ Prox upper arm       0.46                        +-----------------+-------------+----------+--------+ Mid upper arm        0.47                        +-----------------+-------------+----------+--------+ Dist upper arm       0.43                        +-----------------+-------------+----------+--------+ Antecubital fossa    0.49                        +-----------------+-------------+----------+--------+ Prox forearm         0.19                        +-----------------+-------------+----------+--------+ Mid forearm          0.25                         +-----------------+-------------+----------+--------+ Distal forearm       0.20                        +-----------------+-------------+----------+--------+ +-----------------+-------------+----------+---------+  Left Cephalic    Diameter (cm)Depth (cm)Findings  +-----------------+-------------+----------+---------+ Shoulder             0.35        1.30             +-----------------+-------------+----------+---------+ Prox upper arm       0.37        1.30             +-----------------+-------------+----------+---------+ Mid upper arm        0.31        0.87             +-----------------+-------------+----------+---------+ Dist upper arm       0.27        0.56             +-----------------+-------------+----------+---------+ Antecubital fossa    0.31               branching +-----------------+-------------+----------+---------+ Prox forearm         0.22        0.77             +-----------------+-------------+----------+---------+ Mid forearm          0.11                         +-----------------+-------------+----------+---------+ Dist forearm         0.12                         +-----------------+-------------+----------+---------+ +-----------------+-------------+----------+--------------------+ Left Basilic     Diameter (cm)Depth (cm)      Findings       +-----------------+-------------+----------+--------------------+ Prox upper arm       0.66                                    +-----------------+-------------+----------+--------------------+ Mid upper arm        0.38                                    +-----------------+-------------+----------+--------------------+ Dist upper arm       0.33                                    +-----------------+-------------+----------+--------------------+ Antecubital fossa    0.28                    branching       +-----------------+-------------+----------+--------------------+ Prox forearm          0.25                                    +-----------------+-------------+----------+--------------------+ Mid forearm          0.24               vein tapers anterior +-----------------+-------------+----------+--------------------+ Distal forearm       0.23                    branching       +-----------------+-------------+----------+--------------------+ Elbow                0.18                                    +-----------------+-------------+----------+--------------------+ *  See table(s) above for measurements and observations.  Diagnosing physician: Deitra Mayo MD Electronically signed by Deitra Mayo MD on 04/01/2022 at 6:09:43 PM.    Final     Labs: BMET Recent Labs  Lab 03/26/22 1620 03/30/22 1018 03/31/22 0414 04/01/22 0756  NA 137 136 139 138  K 3.6 3.4* 3.4* 3.5  CL 98 97* 101 103  CO2 21 21* 19* 19*  GLUCOSE 271* 261* 275* 190*  BUN 169* 177* 180* 183*  CREATININE 6.22* 7.14* 7.09* 7.14*  CALCIUM 7.2* 6.9* 6.9* 6.9*  PHOS  --   --   --  7.9*   CBC Recent Labs  Lab 03/26/22 1620 03/30/22 1018 03/30/22 1720 03/31/22 0414 03/31/22 1528 04/01/22 0756 04/01/22 1505  WBC 7.3 8.4  --  9.8  --  9.7  --   NEUTROABS  --  6.9  --   --   --  7.7  --   HGB 9.9* 8.7*   < > 7.5* 8.0* 7.3* 7.9*  HCT 32.2* 28.0*   < > 24.1* 25.7* 23.6* 25.2*  MCV 84.1 84.3  --  83.7  --  82.2  --   PLT 133* 116*  --  104*  --  103*  --    < > = values in this interval not displayed.    Medications:     amiodarone  200 mg Oral Daily   aspirin EC  81 mg Oral Q breakfast   atorvastatin  80 mg Oral Daily   Chlorhexidine Gluconate Cloth  6 each Topical Q0600   ferrous sulfate  325 mg Oral BID   fidaxomicin  200 mg Oral BID   heparin  5,000 Units Subcutaneous Q12H   insulin aspart  0-15 Units Subcutaneous TID WC   insulin aspart  0-5 Units Subcutaneous QHS   insulin glargine-yfgn  17 Units Subcutaneous QHS   insulin pump   Subcutaneous TID WC, HS, 0200    levothyroxine  50 mcg Oral Q0600   linagliptin  5 mg Oral Daily   pregabalin  150 mg Oral BID   sevelamer carbonate  1,600 mg Oral TID WC   ursodiol  300 mg Oral Daily   vortioxetine HBr  20 mg Oral Daily      [name], MD 04/02/2022, 7:49 AM

## 2022-04-02 NOTE — Interval H&P Note (Signed)
History and Physical Interval Note:  04/02/2022 10:50 AM  Susan [name]  has presented today for surgery, with the diagnosis of CKD IV.  The various methods of treatment have been discussed with the patient and family. After consideration of risks, benefits and other options for treatment, the patient has consented to  Procedure(s) with comments: LEFT ARM ARTERIOVENOUS (AV) FISTULA CREATION (Left) - PERIPHERAL NERVE BLOCK INSERTION OF TUNNELED DIALYSIS CATHETER (N/A) as a surgical intervention.  The patient's history has been reviewed, patient examined, no change in status, stable for surgery.  I have reviewed the patient's chart and labs.  Questions were answered to the patient's satisfaction.     Annamarie Major

## 2022-04-02 NOTE — Anesthesia Postprocedure Evaluation (Signed)
Anesthesia Post Note  Patient: Susan [name]  Procedure(s) Performed: LEFT ARM ARTERIOVENOUS (AV) FISTULA CREATION (Left: Arm Upper) INSERTION OF TUNNELED DIALYSIS CATHETER (Right: Chest)     Patient location during evaluation: PACU Anesthesia Type: General Level of consciousness: awake and alert Pain management: pain level controlled Vital Signs Assessment: post-procedure vital signs reviewed and stable Respiratory status: spontaneous breathing, nonlabored ventilation, respiratory function stable and patient connected to nasal cannula oxygen Cardiovascular status: blood pressure returned to baseline and stable Postop Assessment: no apparent nausea or vomiting Anesthetic complications: no   No notable events documented.  Last Vitals:  Vitals:   04/02/22 1445 04/02/22 1514  BP: 108/73 120/62  Pulse: 65 68  Resp: 10 17  Temp: 36.6 C 36.6 C  SpO2: 94% 92%    Last Pain:  Vitals:   04/02/22 1514  TempSrc: Oral  PainSc:                  March Rummage Tranae Laramie

## 2022-04-02 NOTE — Op Note (Addendum)
Patient name: Susan [name] MRN: [id number] DOB: 1951/02/14 Sex: female  04/02/2022 Pre-operative Diagnosis: End-stage renal disease Post-operative diagnosis:  Same Surgeon:  Annamarie Major Procedure:   #1: Ultrasound-guided placement of a right internal jugular vein 23 cm dialysis catheter under fluoroscopic visualization.   #2: First stage left basilic vein fistula creation Anesthesia: General Blood Loss:  minimal Specimens:  none  Findings: 3-4 mm basilic vein  Indications: This is a 71 year old female in need of permanent dialysis access.  I discussed placing a catheter as well as a fistula.  We discussed the risk of steal as well as the need for future interventions in particular a second stage procedure.  Procedure:  The patient was identified in the holding area and taken to [place] [facility]  The patient was then placed supine on the table. general anesthesia was administered.  The patient was prepped and draped in the usual sterile fashion.  A time out was called and antibiotics were administered.  Ultrasound was used to evaluate the right internal jugular vein which was widely patent and easily compressible.  A #11 blade was used to make a skin nick.  The right internal jugular vein was then cannulated under ultrasound guidance with a micropuncture needle.  A 018 wire was inserted without resistance followed by placement of micropuncture sheath.  Next, a 035 wire was then inserted into the right atrium under fluoroscopic visualization.  Sequential dilators were used dilate the subcutaneous tissue followed by placement of a peel-away sheath.  A skin nick was used to make an incision below the clavicle on the right.  A tunneler was used to create a tunnel between the 2 incisions and the catheter was brought through the tunnel and placed in the peel-away sheath which was then removed.  Fluoroscopy confirmed that the catheter tip was at the cavoatrial junction.  Both ports flushed and aspirated  without difficulty.  The catheter was sutured into position with a 3-0 nylon.  There was fair amount of oozing from the neck incision and so I closed this with a 3-0 nylon.  Sterile dressings were applied.  Attention was then turned towards the left upper extremity.  I evaluated the basilic and cephalic veins with ultrasound.  The basilic vein appeared to be the better surface vein.  An oblique incision was made just proximal to the antecubital crease.  I first dissected out the brachial artery which was a 3 to 4 mm disease-free artery.  This was encircled with a vessel loop.  I then dissected out the basilic vein which is a 3 to 4 mm vein.  It was fully mobilized throughout the incision.  Side branches were ligated with silk ties.  Next, the vein was marked for orientation.  It was ligated distally with a 2-0 silk tie.  The vein was instilled with heparin saline and dilated nicely.  Next, the brachial artery was occluded with vascular clamps and a #11 blade was used to make an arteriotomy which was extended longitudinally with Potts scissors.  The vein was then cut the appropriate length and beveled to fit the size the arteriotomy.  A running anastomosis was created with 6-0 Prolene.  Prior to completion the appropriate flushing maneuvers were performed and the anastomosis was completed.  I inspected the course of the vein to make sure there were no kinks.  There was an excellent thrill within the fistula.  The patient had brisk radial and ulnar Doppler signals at the wrist.  Next,  the wound was irrigated.  Hemostasis was achieved.  The deep tissue was reapproximated 3-0 Vicryl and the skin was closed with 4-0 Vicryl followed by Dermabond.  There were no immediate complications.  The patient was successfully extubated taken recovery in stable condition.   Disposition: To PACU stable.   Theotis Burrow, M.D., [facility] [place] Office: [phone] Pager:  [phone]

## 2022-04-02 NOTE — Discharge Instructions (Addendum)
[facility]  Discharge Instructions  AV Fistula or Graft Surgery for Dialysis Access  Please refer to the following instructions for your post-procedure care. Your surgeon or physician assistant will discuss any changes with you.  Activity  You may drive the day following your surgery, if you are comfortable and no longer taking prescription pain medication. Resume full activity as the soreness in your incision resolves.  Bathing/Showering  You may shower after you go home. Keep your incision dry for 48 hours. Do not soak in a bathtub, hot tub, or swim until the incision heals completely. You may not shower if you have a hemodialysis catheter.  Incision Care  Clean your incision with mild soap and water after 48 hours. Pat the area dry with a clean towel. You do not need a bandage unless otherwise instructed. Do not apply any ointments or creams to your incision. You may have skin glue on your incision. Do not peel it off. It will come off on its own in about one week. Your arm may swell a bit after surgery. To reduce swelling use pillows to elevate your arm so it is above your heart. Your doctor will tell you if you need to lightly wrap your arm with an ACE bandage.  Diet  Resume your normal diet. There are not special food restrictions following this procedure. In order to heal from your surgery, it is CRITICAL to get adequate nutrition. Your body requires vitamins, minerals, and protein. Vegetables are the best source of vitamins and minerals. Vegetables also provide the perfect balance of protein. Processed food has little nutritional value, so try to avoid this.  Medications  Resume taking all of your medications. If your incision is causing pain, you may take over-the counter pain relievers such as acetaminophen (Tylenol). If you were prescribed a stronger pain medication, please be aware these medications can cause nausea and constipation. Prevent  nausea by taking the medication with a snack or meal. Avoid constipation by drinking plenty of fluids and eating foods with high amount of fiber, such as fruits, vegetables, and grains.  Do not take Tylenol if you are taking prescription pain medications.  Follow up Your surgeon may want to see you in the office following your access surgery. If so, this will be arranged at the time of your surgery.  Please call us immediately for any of the following conditions:  Increased pain, redness, drainage (pus) from your incision site Fever of 101 degrees or higher Severe or worsening pain at your incision site Hand pain or numbness.  Reduce your risk of vascular disease:  Stop smoking. If you would like help, call [facility] at 1-800-QUIT-NOW 319-542-2651) or [place] at [place] your cholesterol Maintain a desired weight Control your diabetes Keep your blood pressure down  Dialysis  It will take several weeks to several months for your new dialysis access to be ready for use. Your surgeon will determine when it is okay to use it. Your nephrologist will continue to direct your dialysis. You can continue to use your Permcath until your new access is ready for use.   04/02/2022 Susan [name] [id number] [birth year]  Surgeon(s): Serafina Mitchell, MD  Procedure(s): LEFT ARM ARTERIOVENOUS (AV) FISTULA CREATION INSERTION OF TUNNELED DIALYSIS CATHETER  x Do not stick fistula for 12 weeks    If you have any questions, please call the office at [phone].    Recommendations for Outpatient Follow-up:  Needs Chem-12, CBC 1  week Recommend Unna boots to stay on until clinic visit with Dr. Sharol Given in 1 week Various medications including multi antihypertensives and diuretics held-see MAR New medications of Sensipar and others added Wheelchair home health physical therapy ordered for home Recommend referral to urology for voiding cystogram in the outpatient setting

## 2022-04-03 ENCOUNTER — Encounter (HOSPITAL_COMMUNITY): Payer: Self-pay | Admitting: Surgery

## 2022-04-03 ENCOUNTER — Ambulatory Visit (HOSPITAL_COMMUNITY)
Admission: RE | Admit: 2022-04-03 | Discharge: 2022-04-03 | Disposition: A | Discharge status: Home / Self Care | Payer: HMO | Source: Ambulatory Visit | Attending: Infectious Diseases | Admitting: Infectious Diseases

## 2022-04-03 DIAGNOSIS — N179 Acute kidney failure, unspecified: Secondary | ICD-10-CM | POA: Diagnosis not present

## 2022-04-03 LAB — CBC
HCT: 25.1 % — ABNORMAL LOW (ref 36.0–46.0)
Hemoglobin: 7.9 g/dL — ABNORMAL LOW (ref 12.0–15.0)
MCH: 25.8 pg — ABNORMAL LOW (ref 26.0–34.0)
MCHC: 31.5 g/dL (ref 30.0–36.0)
MCV: 82 fL (ref 80.0–100.0)
Platelets: 107 10*3/uL — ABNORMAL LOW (ref 150–400)
RBC: 3.06 MIL/uL — ABNORMAL LOW (ref 3.87–5.11)
RDW: 16.8 % — ABNORMAL HIGH (ref 11.5–15.5)
WBC: 7.3 10*3/uL (ref 4.0–10.5)
nRBC: 0 % (ref 0.0–0.2)

## 2022-04-03 LAB — RENAL FUNCTION PANEL
Albumin: 2 g/dL — ABNORMAL LOW (ref 3.5–5.0)
Anion gap: 20 — ABNORMAL HIGH (ref 5–15)
BUN: 143 mg/dL — ABNORMAL HIGH (ref 8–23)
CO2: 18 mmol/L — ABNORMAL LOW (ref 22–32)
Calcium: 7.3 mg/dL — ABNORMAL LOW (ref 8.9–10.3)
Chloride: 95 mmol/L — ABNORMAL LOW (ref 98–111)
Creatinine, Ser: 5.89 mg/dL — ABNORMAL HIGH (ref 0.44–1.00)
GFR, Estimated: 7 mL/min — ABNORMAL LOW (ref >60)
Glucose, Bld: 354 mg/dL — ABNORMAL HIGH (ref 70–99)
Phosphorus: 8.1 mg/dL — ABNORMAL HIGH (ref 2.5–4.6)
Potassium: 3.7 mmol/L (ref 3.5–5.1)
Sodium: 133 mmol/L — ABNORMAL LOW (ref 135–145)

## 2022-04-03 LAB — GLUCOSE, CAPILLARY
Glucose-Capillary: 362 mg/dL — ABNORMAL HIGH (ref 70–99)
Glucose-Capillary: 342 mg/dL — ABNORMAL HIGH (ref 70–99)
Glucose-Capillary: 344 mg/dL — ABNORMAL HIGH (ref 70–99)
Glucose-Capillary: 361 mg/dL — ABNORMAL HIGH (ref 70–99)
Glucose-Capillary: 373 mg/dL — ABNORMAL HIGH (ref 70–99)

## 2022-04-03 MED ORDER — INSULIN GLARGINE-YFGN 100 UNIT/ML ~~LOC~~ SOLN
22.0000 [IU] | Freq: Every day | SUBCUTANEOUS | Status: DC
  Administered 2022-04-03 – 2022-04-06 (×4): 22 [IU] via SUBCUTANEOUS
  Filled 2022-04-03 (×5): qty 0.22

## 2022-04-03 MED ORDER — RENA-VITE PO TABS
1.0000 | ORAL_TABLET | Freq: Every day | ORAL | Status: DC
  Administered 2022-04-03 – 2022-04-07 (×5): 1 via ORAL
  Filled 2022-04-03 (×5): qty 1

## 2022-04-03 MED ORDER — PROSOURCE PLUS PO LIQD
30.0000 mL | Freq: Every day | ORAL | Status: DC
  Administered 2022-04-05 – 2022-04-06 (×2): 30 mL via ORAL
  Filled 2022-04-03 (×2): qty 30

## 2022-04-03 MED ORDER — PREGABALIN 75 MG PO CAPS
75.0000 mg | ORAL_CAPSULE | Freq: Every day | ORAL | Status: DC
  Administered 2022-04-04 – 2022-04-07 (×4): 75 mg via ORAL
  Filled 2022-04-03 (×4): qty 1

## 2022-04-03 NOTE — Evaluation (Signed)
Physical Therapy Evaluation Patient Details Name: Susan [name] MRN: [id number] DOB: [birth year] Today's Date: 04/03/2022  History of Present Illness  71 y/o female presented to ED on 03/30/22 for blister on R heel with drainage. Found to be bleeding from varicose veins 2/2 anticoagulation for Afib. Admitted for AKI also. PMH: hx of MI, T2DM, CHF, CAD, HTN, TIA, venous insufficiency, CKD  Clinical Impression  Patient admitted with the above. PTA, patient lives with husband and was independent until 1 week ago. Patient currently presents with weakness, impaired balance, decreased activity tolerance, and pain. Patient required max-totalA for bed mobility and unable to attempt standing due to lack of +2 and reported pain in B LE. Educated patient on quad sets, SLR, and hip abduction/adduction while in supine to assist with strengthening of LE, patient verbalized understanding. Patient will benefit from skilled PT services during acute stay to address listed deficits. Recommend SNF at discharge to maximize functional mobility and safety.        Recommendations for follow up therapy are one component of a multi-disciplinary discharge planning process, led by the attending physician.  Recommendations may be updated based on patient status, additional functional criteria and insurance authorization.  Follow Up Recommendations Skilled nursing-short term rehab (<3 hours/day) Can patient physically be transported by private vehicle: No    Assistance Recommended at Discharge Frequent or constant Supervision/Assistance  Patient can return home with the following  A lot of help with walking and/or transfers;A lot of help with bathing/dressing/bathroom;Assistance with cooking/housework;Assist for transportation;Help with stairs or ramp for entrance    Equipment Recommendations Other (comment) (TBD)  Recommendations for Other Services       Functional Status Assessment Patient has had a recent decline in  their functional status and demonstrates the ability to make significant improvements in function in a reasonable and predictable amount of time.     Precautions / Restrictions Precautions Precautions: Fall Precaution Comments: B unna boots      Mobility  Bed Mobility Overal bed mobility: Needs Assistance Bed Mobility: Supine to Sit, Sit to Supine     Supine to sit: Max assist Sit to supine: Total assist   General bed mobility comments: able to advance LEs with assistance and step by step cueing. Overall required maxA to come to EOB and totalA to return to supine as patient not assisting    Transfers                   General transfer comment: deferred due to lack of +2 and pain in BLE    Ambulation/Gait                  Stairs            Wheelchair Mobility    Modified Rankin (Stroke Patients Only)       Balance Overall balance assessment: Needs assistance Sitting-balance support: Bilateral upper extremity supported, Feet supported Sitting balance-Leahy Scale: Poor Sitting balance - Comments: required maxA initially then progressed to minA Postural control: Posterior lean                                   Pertinent Vitals/Pain Pain Assessment Pain Assessment: Faces Faces Pain Scale: Hurts even more Pain Location: B LE in dependent position Pain Descriptors / Indicators: Grimacing, Guarding, Aching Pain Intervention(s): Monitored during session, Repositioned    Home Living Family/patient expects to be discharged to:: Private residence Living  Arrangements: Spouse/significant other Available Help at Discharge: Family;Available PRN/intermittently Type of Home: House Home Access: Ramped entrance       Home Layout: One level Home Equipment: Grab bars - tub/shower;Rolling Susan [name] (2 wheels);Rollator (4 wheels);BSC/3in1;Cane - quad;Shower seat Additional Comments: husband works as Naval architect during day, typically home by  6p; shower seat does not fit in her tub    Prior Function Prior Level of Function : Independent/Modified Independent;Driving             Mobility Comments: modI with RW. Has not ambulated x 1 week       Hand Dominance        Extremity/Trunk Assessment   Upper Extremity Assessment Upper Extremity Assessment: Defer to OT evaluation    Lower Extremity Assessment Lower Extremity Assessment: Generalized weakness       Communication   Communication: No difficulties  Cognition Arousal/Alertness: Awake/alert Behavior During Therapy: WFL for tasks assessed/performed Overall Cognitive Status: Within Functional Limits for tasks assessed                                          General Comments      Exercises     Assessment/Plan    PT Assessment Patient needs continued PT services  PT Problem List Decreased strength;Decreased balance;Decreased activity tolerance;Decreased mobility;Decreased knowledge of precautions;Pain       PT Treatment Interventions Gait training;DME instruction;Functional mobility training;Therapeutic activities;Therapeutic exercise;Balance training;Patient/family education    PT Goals (Current goals can be found in the Care Plan section)  Acute Rehab PT Goals Patient Stated Goal: to get stronger PT Goal Formulation: With patient Time For Goal Achievement: 04/17/22 Potential to Achieve Goals: Fair    Frequency Min 2X/week     Co-evaluation               AM-PAC PT "6 Clicks" Mobility  Outcome Measure Help needed turning from your back to your side while in a flat bed without using bedrails?: Total Help needed moving from lying on your back to sitting on the side of a flat bed without using bedrails?: Total Help needed moving to and from a bed to a chair (including a wheelchair)?: Total Help needed standing up from a chair using your arms (e.g., wheelchair or bedside chair)?: Total Help needed to walk in hospital  room?: Total Help needed climbing 3-5 steps with a railing? : Total 6 Click Score: 6    End of Session   Activity Tolerance: Patient limited by pain Patient left: in bed;with call bell/phone within reach Nurse Communication: Mobility status PT Visit Diagnosis: Muscle weakness (generalized) (M62.81);Unsteadiness on feet (R26.81);Other abnormalities of gait and mobility (R26.89);Difficulty in walking, not elsewhere classified (R26.2)    Time: 8119-1478 PT Time Calculation (min) (ACUTE ONLY): 21 min   Charges:   PT Evaluation $PT Eval Moderate Complexity: 1 Mod          Susan [name] A. Dan Humphreys PT, DPT Acute Rehabilitation Services Office [phone]   Susan [name] 04/03/2022, 5:25 PM

## 2022-04-03 NOTE — Progress Notes (Signed)
Pt has new AV fistula, on my assessment was oozing serous fluid, new per RN that took care of pt last night. Edema and bruising present, some redness at the site as well. Bruit and trill present, distal pulses weeks bilaterally +1, no numbness no tingling, extremity cool to touch, pt able to move extremity. VS stable.  Mansy, MD notified, and RN advised to notify nephrologist on call. Vascular surgeon notified per per nephrologist request. Orvan Falconer, vascular surgeon made aware, no new orders at this time. Will continue to monitor pt closely.

## 2022-04-03 NOTE — Progress Notes (Signed)
Initial Nutrition Assessment  DOCUMENTATION CODES:   Not applicable  INTERVENTION:  - Renal/carb modified diet - Prosource Plus once daily to support increased protein needs - Rena-vit daily to support micronutrient needs - Renvela per MD  - Will need diet education at next visit   NUTRITION DIAGNOSIS:   Increased nutrient needs related to catabolic illness, other (see comment) (CKD5 starting HD and chronic wounds) as evidenced by estimated needs.  GOAL:   Patient will meet greater than or equal to 90% of their needs  MONITOR:   PO intake, Supplement acceptance, Weight trends, Labs  REASON FOR ASSESSMENT:   Other (Comment) (new HD patient, wounds)    ASSESSMENT:   71 y.o. female with PMH CKD 5, CAD, DMII, chronic wounds, admitted for bleeding for ulcer on leg  Met with patient at bedside this afternoon. Patient reports tolerating dialysis session last night but notes she is scared of all the changes. She reports typically weighing less than 200# but notes she has a lot of fluid on her that has caused her to gain significant weight recently. Chart review confirms weight stable since July in the 235-245# range but patient admitted at 276#.  Ambulates with a walker at home where she lives with her husband. Usually eats 3 meals a day at home but reports over the past few weeks he has only eaten around 1 meal daily. And this often consisted of just a peanut butter sandwich. Decreased intake was a result of poor appetite. However, pt reports her current appetite is improved and she has been eating fairly well during admission. Noted to have had 75-100% of meals previously but only 15% of lunch today.  Briefly discussed nutrition and dialysis and discussed Renvela, need for a renal friendly MVI, and importance of protein with protein supplements. Patient receptive to information. Will need formal diet education at later date.  Medications reviewed and include: Insulin, Synthroid,  Tradjenta, Renvela  Labs reviewed:  Sodium 133 (L) Creatinine 5.89 (H) GFR 7 (L) Phosphorus 8.1 (H) HA1C >15.5 Blood glucose 130-362 x24 hours   NUTRITION - FOCUSED PHYSICAL EXAM:  Flowsheet Row Most Recent Value  Orbital Region Moderate depletion  Upper Arm Region Mild depletion  Thoracic and Lumbar Region No depletion  Buccal Region No depletion  Temple Region No depletion  Clavicle Bone Region No depletion  Clavicle and Acromion Bone Region No depletion  Scapular Bone Region Unable to assess  Dorsal Hand No depletion  Patellar Region No depletion  Anterior Thigh Region No depletion  Posterior Calf Region No depletion  Edema (RD Assessment) Severe  Hair Reviewed  Eyes Reviewed  Mouth Reviewed  Skin Reviewed  Nails Reviewed       Diet Order:   Diet Order             Diet renal/carb modified with fluid restriction Diet-HS Snack? Nothing; Fluid restriction: 1200 mL Fluid; Room service appropriate? Yes; Fluid consistency: Thin  Diet effective now                   EDUCATION NEEDS:   Not appropriate for education at this time (needs diet education at next visit)  Skin:  Skin Assessment: Skin Integrity Issues: Skin Integrity Issues:: Other (Comment), DTI DTI: R buttocks Other: open wounds on L distal pretibial with venous stasis ulcer   Last BM:  unknown  Height:  Ht Readings from Last 1 Encounters:  04/01/22 _0  (1.651 m)   Weight:  Wt Readings from Last  1 Encounters:  04/03/22 125.7 kg    Ideal Body Weight:  56.8 kg  BMI:  Body mass index is 46.11 kg/m.  Estimated Nutritional Needs:  Kcal:  1700-2000 kcal Protein:  125 grams  Fluid:  1519m    ASamson FredericRD, LDN For contact information, refer to [facility]

## 2022-04-03 NOTE — Progress Notes (Signed)
Requested to see pt for out-pt HD needs at d/c. Met with pt at bedside. Introduced self and explained role. Pt lives in [place] ([place]) and prefers [facility]. Referral submitted to [facility] admissions for review. Pt lives at home with husband but he works long hours. Pt states that husband will likely have to leave work to assist with transportation to/from HD. Pt also voiced concerns about her mobility.Medical team made aware of pt's concerns via secure chat. Will assist as needed.     Renal Navigator [phone] 

## 2022-04-03 NOTE — Progress Notes (Signed)
[facility] Progress Note   71 y.o. 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 Hollie Salk with [facility].  She has issues with fluid and salt restriction in the setting of CHF also followed by Dr. Haroldine Laws. Torsemide dose is quite high at '80mg'$  BID and she's not responding well recently. She has had positive urine culture with Enterobacter aerogenes S to quinolones, gentamycin, tobramycin, and bactrim.  Here with bleeding from a wound on the right found noted to be tearing of the skin.  She had UNNA boots but the right one was removed in the ED because of the wound.     Assessment/ Plan:   Acute kidney injury likely secondary to cardiorenal on advanced CKD IV/V with a baseline creatinine in the 3.5-4.2 range in 01/2022 but has been steadily worsening and in the 4.8-5.1 range in 02/2022. She either has progressive CKD but hoping there is a significant component of cardiorenal. Fortunately no absolute indications for dialysis at this time but certainly possible she may need it this hospitalization. She was tearful when I explained this but expressed understanding. - Not a good response to Lasix '120mg'$  -> stopped the Metolazone and Lasix. - Appreciate VVS Dr. Trula Slade placing RIJ TC and 1st stage BBF on 11/8. Patient rx HD #1 very late on 11/8 and didn't complete the treatment till after MN. Will plan on 2nd treatment Fri and #3 on Sat. She agrees and actually also asked that we don't dialyze her today. Already feeling better.  - Currently in process of CLIP as well.   - 2gm Na diet and fluid restrict to 1L per day -Monitor Daily I/Os, Daily weight  -Maintain MAP>65 for optimal renal perfusion.  - Avoid nephrotoxic agents such as IV contrast,  NSAIDs, and phosphate containing bowel preps (FLEETS)   HFrEF with EF 30% in the setting of severe TR more refractory to diuretics at this time. Renal osteodystrophy - will check a iPTH, phos 7.9 -> started Renvela 2 TIDM but not eating much w/ the nausea. Will need education of higher phos foods to avoid Anemia - 7% sats (needs iron, will load with Ferrlecit '250mg'$  x2 given she received 2 units on 11/6). She's getting #2/2 of the Ferrlecit today. 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 Severe TR DM  PAF on Eliquis  Subjective:   Nausea much better today after very late HD overnight; denies SOB.   Objective:   BP (!) 119/47 (BP Location: Right Arm)   Pulse 69   Temp 98.1 F (36.7 C) (Oral)   Resp 18   Ht '5\' 5"'$  (1.651 m)   Wt 125.7 kg   SpO2 95%   BMI 46.11 kg/m   Intake/Output Summary (Last 24 hours) at 04/03/2022 1012 Last data filed at 04/03/2022 0900 Gross per 24 hour  Intake 1189.89 ml  Output 1310 ml  Net -120.11 ml   Weight change: 0 kg  Physical Exam: GEN: NAD, sitting at an incline in bed HEENT: EOMI NECK: supple, JVD present RESP:clear bilaterally no c/w/r CV: RRR, I/V systolic murmur today no rub or gallop appreciated ABD: soft, obese, NTND, +BS, no guarding or rebound tenderness LE: 1+ LE edema with a superficial skin tear in the right foot  NEURO: no asterixis ACCESS: RIJ TC, good thrill in left BBF  Imaging: DG Chest Port 1 View  Result Date: 04/02/2022 CLINICAL DATA:  Dialysis catheter insertion EXAM: PORTABLE CHEST 1 VIEW COMPARISON:  02/01/2022 FINDINGS: Right internal jugular catheter in place with the tip at the SVC RA junction. No pneumothorax. Cardiomegaly. Aortic atherosclerosis. Possible venous hypertension but without frank edema. No infiltrate, collapse or effusion. IMPRESSION: 1. Right internal jugular catheter tip at the SVC RA junction. No pneumothorax. 2. Cardiomegaly. Possible venous hypertension. Electronically Signed   By: Nelson Chimes M.D.    On: 04/02/2022 15:15   DG C-Arm 1-60 Min  Result Date: 04/02/2022 CLINICAL DATA:  ESRD.  Dialysis catheter insertion EXAM: DG C-ARM 1-60 MIN COMPARISON:  Chest XR, 02/01/2022 FLUOROSCOPY: Exposure Index (as provided by the fluoroscopic device): 2.94 MGy Kerma FINDINGS: Single, limited planar imaging of the RIGHT chest. Image demonstrating RIGHT IJ approach tunneled dialysis catheter placement. Tip not fully imaged, though appears to be within RIGHT atrium. IMPRESSION: Fluoroscopic imaging for RIGHT tunneled dialysis catheter placement. For complete description of intra procedural findings, please see performing service dictation. Electronically Signed   By: Michaelle Birks M.D.   On: 04/02/2022 14:24   VAS [name] UPPER EXT VEIN MAPPING (PRE-OP AVF)  Result Date: 04/01/2022 UPPER EXTREMITY VEIN MAPPING Patient Name:  AROUSH [name]  Date of Exam:   04/01/2022 Medical Rec #: [id number]     Accession #:    [id number] Date of Birth: 11-29-1950     Patient Gender: F Patient Age:   71 years Exam Location:  [facility] Procedure:      VAS [name] UPPER EXT VEIN MAPPING (PRE-OP AVF) Referring Phys: EMMA COLLINS --------------------------------------------------------------------------------  Indications: Pre-access. History: CRD stage IV, CAD, chronic HF pEF, morbid obesity, IIDM.  Performing Technologist: Oda Cogan RDMS, RVT  Examination Guidelines: A complete evaluation includes B-mode imaging, spectral Doppler, color Doppler, and power Doppler as needed of all accessible portions of each vessel. Bilateral testing is considered an integral part of a complete examination. Limited examinations for reoccurring indications may be performed as noted. +-----------------+-------------+----------+---------+ Right Cephalic   Diameter (cm)Depth (cm)Findings  +-----------------+-------------+----------+---------+ Shoulder             0.29        0.97             +-----------------+-------------+----------+---------+  Prox upper arm       0.28        1.70   branching +-----------------+-------------+----------+---------+ Mid upper arm        0.21        1.71             +-----------------+-------------+----------+---------+ Dist upper arm       0.21        1.24             +-----------------+-------------+----------+---------+ Antecubital fossa    0.48        0.49             +-----------------+-------------+----------+---------+ Prox forearm         0.22        0.80             +-----------------+-------------+----------+---------+ Mid forearm          0.23        0.79             +-----------------+-------------+----------+---------+ Dist forearm         0.11                         +-----------------+-------------+----------+---------+ +-----------------+-------------+----------+--------+  Right Basilic    Diameter (cm)Depth (cm)Findings +-----------------+-------------+----------+--------+ Prox upper arm       0.46                        +-----------------+-------------+----------+--------+ Mid upper arm        0.47                        +-----------------+-------------+----------+--------+ Dist upper arm       0.43                        +-----------------+-------------+----------+--------+ Antecubital fossa    0.49                        +-----------------+-------------+----------+--------+ Prox forearm         0.19                        +-----------------+-------------+----------+--------+ Mid forearm          0.25                        +-----------------+-------------+----------+--------+ Distal forearm       0.20                        +-----------------+-------------+----------+--------+ +-----------------+-------------+----------+---------+ Left Cephalic    Diameter (cm)Depth (cm)Findings  +-----------------+-------------+----------+---------+ Shoulder             0.35        1.30              +-----------------+-------------+----------+---------+ Prox upper arm       0.37        1.30             +-----------------+-------------+----------+---------+ Mid upper arm        0.31        0.87             +-----------------+-------------+----------+---------+ Dist upper arm       0.27        0.56             +-----------------+-------------+----------+---------+ Antecubital fossa    0.31               branching +-----------------+-------------+----------+---------+ Prox forearm         0.22        0.77             +-----------------+-------------+----------+---------+ Mid forearm          0.11                         +-----------------+-------------+----------+---------+ Dist forearm         0.12                         +-----------------+-------------+----------+---------+ +-----------------+-------------+----------+--------------------+ Left Basilic     Diameter (cm)Depth (cm)      Findings       +-----------------+-------------+----------+--------------------+ Prox upper arm       0.66                                    +-----------------+-------------+----------+--------------------+ Mid upper arm        0.38                                    +-----------------+-------------+----------+--------------------+  Dist upper arm       0.33                                    +-----------------+-------------+----------+--------------------+ Antecubital fossa    0.28                    branching       +-----------------+-------------+----------+--------------------+ Prox forearm         0.25                                    +-----------------+-------------+----------+--------------------+ Mid forearm          0.24               vein tapers anterior +-----------------+-------------+----------+--------------------+ Distal forearm       0.23                    branching       +-----------------+-------------+----------+--------------------+  Elbow                0.18                                    +-----------------+-------------+----------+--------------------+ *See table(s) above for measurements and observations.  Diagnosing physician: Deitra Mayo MD Electronically signed by Deitra Mayo MD on 04/01/2022 at 6:09:43 PM.    Final     Labs: BMET Recent Labs  Lab 03/30/22 1018 03/31/22 0414 04/01/22 0756 04/02/22 0703 04/03/22 0528  NA 136 139 138 136 133*  K 3.4* 3.4* 3.5 3.5 3.7  CL 97* 101 103 101 95*  CO2 21* 19* 19* 18* 18*  GLUCOSE 261* 275* 190* 133* 354*  BUN 177* 180* 183* 183* 143*  CREATININE 7.14* 7.09* 7.14* 7.42* 5.89*  CALCIUM 6.9* 6.9* 6.9* 7.1* 7.3*  PHOS  --   --  7.9* 8.7* 8.1*   CBC Recent Labs  Lab 03/30/22 1018 03/30/22 1720 03/31/22 0414 03/31/22 1528 04/01/22 0756 04/01/22 1505 04/02/22 0703 04/03/22 0528  WBC 8.4  --  9.8  --  9.7  --  10.2 7.3  NEUTROABS 6.9  --   --   --  7.7  --  8.3*  --   HGB 8.7*   < > 7.5*   < > 7.3* 7.9* 7.8* 7.9*  HCT 28.0*   < > 24.1*   < > 23.6* 25.2* 25.0* 25.1*  MCV 84.3  --  83.7  --  82.2  --  82.5 82.0  PLT 116*  --  104*  --  103*  --  107* 107*   < > = values in this interval not displayed.    Medications:     amiodarone  200 mg Oral Daily   aspirin EC  81 mg Oral Q breakfast   atorvastatin  80 mg Oral Daily   Chlorhexidine Gluconate Cloth  6 each Topical Q0600   darbepoetin (ARANESP) injection - DIALYSIS  100 mcg Subcutaneous Q Thu-1800   fidaxomicin  200 mg Oral BID   heparin  5,000 Units Subcutaneous Q12H   heparin sodium (porcine)       insulin aspart  0-15 Units Subcutaneous TID WC   insulin aspart  0-5 Units Subcutaneous QHS   insulin glargine-yfgn  17  Units Subcutaneous QHS   levothyroxine  50 mcg Oral Q0600   linagliptin  5 mg Oral Daily   pregabalin  150 mg Oral BID   sevelamer carbonate  1,600 mg Oral TID WC   ursodiol  300 mg Oral Daily   vortioxetine HBr  20 mg Oral Daily      [name],  MD 04/03/2022, 10:12 AM

## 2022-04-03 NOTE — Progress Notes (Addendum)
[facility]  Subjective  - left UE is sore,    Objective (!) 124/48 74 98.6 F (37 C) (Oral) 12 98%  Intake/Output Summary (Last 24 hours) at 04/03/2022 0737 Last data filed at 04/03/2022 0600 Gross per 24 hour  Intake 1129.89 ml  Output 1310 ml  Net -180.11 ml   Left AC incision healing well.  No motor loss or sensation loss right UE Palpable thrill at anastomosis left UE Right TDC dressing clean and dry  Assessment/Planning: AKI on CKD POD # 1  #1: Ultrasound-guided placement of a right internal jugular vein 23 cm dialysis catheter under fluoroscopic visualization.   #2: First stage left basilic vein fistula creation                        She will follow up in our office in 5-6 weeks for duplex and planning of second stage basilic transposition.  Roxy Horseman 04/03/2022 7:37 AM --  Laboratory Lab Results: Recent Labs    04/02/22 0703 04/03/22 0528  WBC 10.2 7.3  HGB 7.8* 7.9*  HCT 25.0* 25.1*  PLT 107* 107*   BMET Recent Labs    04/02/22 0703 04/03/22 0528  NA 136 133*  K 3.5 3.7  CL 101 95*  CO2 18* 18*  GLUCOSE 133* 354*  BUN 183* 143*  CREATININE 7.42* 5.89*  CALCIUM 7.1* 7.3*    COAG Lab Results  Component Value Date   INR 1.5 (H) 02/01/2022   INR 1.3 (H) 01/14/2022   INR 0.9 03/05/2021   No results found for: "PTT"   I agree with the above.  I have seen and evaluated the patient.  She has an excellent thrill within her fistula.  There is no evidence of steal.  She will follow-up in the office in 6 weeks for duplex of her fistula.  Annamarie Major

## 2022-04-03 NOTE — Progress Notes (Signed)
PROGRESS NOTE    Susan [name]  IRW:[id number] DOB: 16-Jan-1951 DOA: 03/30/2022 PCP: [name], MD  Chief Complaint  Patient presents with   Wound Check    Brief Narrative:  Susan [name] is a 71 y.o. female with medical history significant of PAF on Eliquis, CKD stage IV, CAD, chronic HFpEF, morbid obesity, IIDM, chronic venous stasis wound on right leg, recent recurrent C. difficile colitis, recurrent UTIs, presented with bleeding from the right leg venous stasis ulcer.   Hospitalization also complicated by worsening renal function with uremia.  Renal following, planning for trial of lasix and likely will need dialysis.  Vascular has been consulted for access + TDC.   Assessment & Plan:   Principal Problem:   AKI (acute kidney injury) ([place]) Active Problems:   Venous stasis ulcer of right calf (HCC)   Assessment and Plan: Acute Kidney Injury on CKD IV or V  Uremia Baseline creatinine appears to be ~3-5 over the past year or so, recently worsening with creatinine 7.09 + asterixis, mild confusion she reports for the past week or so Appreciate renal assistance, diuretic challenge with 120 mg lasix q8  Unfortunately, she did not respond and decision made to initiate dialysis Seen by vascular surgery and underwent AV fistula and TDC placement on 11/8  Chronic HFrEF (EF 30%) - Echo 01/2022 TEE with EF 30%, RVSF moderately reduced (see report) - case discussed by Dr. Augustin Coupe with Dr. Aundra Dubin of advanced HF service who did not believe patient would benefit from inotropes and not candidate for home inotropes.  Right leg venous ulcer bleeding  Bleeding from Varicose Veins secondary to Anticoagulation therapy for Atrial Fibrillation -holding eliquis for now -s/p DDAVP in setting of her uremia -appreciate orthopedic assistance, recommending UNNA boots and prafo boot for right, bleeding appears to have subsided--dressing changes asper Ortho, Will clarify WB rec's--will ask therapy to  eval -left leg ulcer treated with Kerecis micro graft  -follow up with ortho next week for reapplication Kerecis tissue graft left leg   Acute Blood Loss Anemia Stable, but fluctuating Follow type and screen, repeat H/H Transfuse prn for Hemoglobin below 7   PAF, CHad2Vasc2>4 - Hold off Eliquis due to bleeding above.  - Continue amiodarone 200 qd   HTN -Blood pressure borderline low, hold off home BP meds, start as needed hydralazine -low threshold for transfusion   Recurrent C. difficile colitis -Continue for Fidaxomycin -saw ID on 11/1, was prescribed fidaxomicin on 11/3 for her C difficile --will need to complete 10 days on 11/102and then re-eval   Concern for Colovesical Fistula - discussed with urology, consider CT cystogram while she's here, will defer for now until renal function/volume status have stabilized  IDDM hyperglycemia -has insulin pump, protocol for patient administration ordered  - pump discontinued 11/7 -started on basal/bolus insulin--Sugars 340--360, increase La to 22 U today, started Tradjenta 5 this admit -would prefer transitioning back to pump when able   History of recurrent UTI -On chronic suppression antibiotics, but we will hold off MicroBid for now   Gout -Hold off colchicine due to AKI  Depression -continues on Trintellix 20 as per Prior meds    DVT prophylaxis: heparin Code Status: full Family Communication: none Disposition:   Status is: Inpatient Remains inpatient appropriate because: pending further improvement   Consultants:  Renal Ortho Vascular surgery  Procedures:  11/8 Left arm AV fistula  11/8 TDC placement  Antimicrobials:  Anti-infectives (From admission, onward)    Start  Dose/Rate Route Frequency Ordered Stop   04/02/22 0000  vancomycin (VANCOCIN) IVPB 1000 mg/200 mL premix        1,000 mg 200 mL/hr over 60 Minutes Intravenous To Surgery 04/01/22 1513 04/02/22 1057   03/30/22 2200  fidaxomicin (DIFICID)  tablet 200 mg        200 mg Oral 2 times daily 03/30/22 1759 04/06/22 2159       Subjective:   A little bit sleepy awakens readily but not sure how much she comprehends She has not been out of bed since admission She has no pain no fever no chills no nausea no vomiting  Objective: Vitals:   04/02/22 2328 04/02/22 2343 04/03/22 0000 04/03/22 0908  BP: (!) 131/53 (!) 128/50 (!) 124/48 (!) 119/47  Pulse: 76 75 74 69  Resp: '15 10 12 18  '$ Temp: 98.6 F (37 C) 98.6 F (37 C) 98.6 F (37 C) 98.1 F (36.7 C)  TempSrc: Oral Oral Oral Oral  SpO2: 96% 98% 98% 95%  Weight:   125.7 kg   Height:        Intake/Output Summary (Last 24 hours) at 04/03/2022 1116 Last data filed at 04/03/2022 0900 Gross per 24 hour  Intake 1189.89 ml  Output 1310 ml  Net -120.11 ml    Filed Weights   04/01/22 1353 04/02/22 2143 04/03/22 0000  Weight: 126.7 kg 126.7 kg 125.7 kg    Examination:  EOMI NCAT thick neck Mallampati 4 no icterus no pallor Chest clear no rales rhonchi wheeze ROM intact no focal deficit Abdomen soft no rebound no guarding Unna boots On both legs with forefoot exposed on the right foot     Data Reviewed: I have personally reviewed following labs and imaging studies  CBC: Recent Labs  Lab 03/30/22 1018 03/30/22 1720 03/31/22 0414 03/31/22 1528 04/01/22 0756 04/01/22 1505 04/02/22 0703 04/03/22 0528  WBC 8.4  --  9.8  --  9.7  --  10.2 7.3  NEUTROABS 6.9  --   --   --  7.7  --  8.3*  --   HGB 8.7*   < > 7.5* 8.0* 7.3* 7.9* 7.8* 7.9*  HCT 28.0*   < > 24.1* 25.7* 23.6* 25.2* 25.0* 25.1*  MCV 84.3  --  83.7  --  82.2  --  82.5 82.0  PLT 116*  --  104*  --  103*  --  107* 107*   < > = values in this interval not displayed.     Basic Metabolic Panel: Recent Labs  Lab 03/30/22 1018 03/31/22 0414 04/01/22 0756 04/02/22 0703 04/03/22 0528  NA 136 139 138 136 133*  K 3.4* 3.4* 3.5 3.5 3.7  CL 97* 101 103 101 95*  CO2 21* 19* 19* 18* 18*  GLUCOSE 261* 275*  190* 133* 354*  BUN 177* 180* 183* 183* 143*  CREATININE 7.14* 7.09* 7.14* 7.42* 5.89*  CALCIUM 6.9* 6.9* 6.9* 7.1* 7.3*  MG  --   --  2.2 2.2  --   PHOS  --   --  7.9* 8.7* 8.1*     GFR: Estimated Creatinine Clearance: 11.5 mL/min (A) (by C-G formula based on SCr of 5.89 mg/dL (H)).  Liver Function Tests: Recent Labs  Lab 04/01/22 0756 04/02/22 0703 04/03/22 0528  AST 14* 15  --   ALT 15 17  --   ALKPHOS 92 96  --   BILITOT 0.6 0.7  --   PROT 5.0* 4.9*  --  ALBUMIN 2.1* 2.1* 2.0*     CBG: Recent Labs  Lab 04/02/22 1418 04/02/22 1515 04/02/22 2000 04/03/22 0731 04/03/22 1113  GLUCAP 137* 134* 314* 362* 342*      No results found for this or any previous visit (from the past 240 hour(s)).       Radiology Studies: DG Chest Port 1 View  Result Date: 04/02/2022 CLINICAL DATA:  Dialysis catheter insertion EXAM: PORTABLE CHEST 1 VIEW COMPARISON:  02/01/2022 FINDINGS: Right internal jugular catheter in place with the tip at the SVC RA junction. No pneumothorax. Cardiomegaly. Aortic atherosclerosis. Possible venous hypertension but without frank edema. No infiltrate, collapse or effusion. IMPRESSION: 1. Right internal jugular catheter tip at the SVC RA junction. No pneumothorax. 2. Cardiomegaly. Possible venous hypertension. Electronically Signed   By: Nelson Chimes M.D.   On: 04/02/2022 15:15   DG C-Arm 1-60 Min  Result Date: 04/02/2022 CLINICAL DATA:  ESRD.  Dialysis catheter insertion EXAM: DG C-ARM 1-60 MIN COMPARISON:  Chest XR, 02/01/2022 FLUOROSCOPY: Exposure Index (as provided by the fluoroscopic device): 2.94 MGy Kerma FINDINGS: Single, limited planar imaging of the RIGHT chest. Image demonstrating RIGHT IJ approach tunneled dialysis catheter placement. Tip not fully imaged, though appears to be within RIGHT atrium. IMPRESSION: Fluoroscopic imaging for RIGHT tunneled dialysis catheter placement. For complete description of intra procedural findings, please see  performing service dictation. Electronically Signed   By: Michaelle Birks M.D.   On: 04/02/2022 14:24   VAS [name] UPPER EXT VEIN MAPPING (PRE-OP AVF)  Result Date: 04/01/2022 UPPER EXTREMITY VEIN MAPPING Patient Name:  HEVIN JEFFCOAT  Date of Exam:   04/01/2022 Medical Rec #: [id number]     Accession #:    [id number] Date of Birth: 08/29/1950     Patient Gender: F Patient Age:   25 years Exam Location:  [facility] Procedure:      VAS [name] UPPER EXT VEIN MAPPING (PRE-OP AVF) Referring Phys: EMMA COLLINS --------------------------------------------------------------------------------  Indications: Pre-access. History: CRD stage IV, CAD, chronic HF pEF, morbid obesity, IIDM.  Performing Technologist: Oda Cogan RDMS, RVT  Examination Guidelines: A complete evaluation includes B-mode imaging, spectral Doppler, color Doppler, and power Doppler as needed of all accessible portions of each vessel. Bilateral testing is considered an integral part of a complete examination. Limited examinations for reoccurring indications may be performed as noted. +-----------------+-------------+----------+---------+ Right Cephalic   Diameter (cm)Depth (cm)Findings  +-----------------+-------------+----------+---------+ Shoulder             0.29        0.97             +-----------------+-------------+----------+---------+ Prox upper arm       0.28        1.70   branching +-----------------+-------------+----------+---------+ Mid upper arm        0.21        1.71             +-----------------+-------------+----------+---------+ Dist upper arm       0.21        1.24             +-----------------+-------------+----------+---------+ Antecubital fossa    0.48        0.49             +-----------------+-------------+----------+---------+ Prox forearm         0.22        0.80             +-----------------+-------------+----------+---------+ Mid forearm  0.23        0.79              +-----------------+-------------+----------+---------+ Dist forearm         0.11                         +-----------------+-------------+----------+---------+ +-----------------+-------------+----------+--------+ Right Basilic    Diameter (cm)Depth (cm)Findings +-----------------+-------------+----------+--------+ Prox upper arm       0.46                        +-----------------+-------------+----------+--------+ Mid upper arm        0.47                        +-----------------+-------------+----------+--------+ Dist upper arm       0.43                        +-----------------+-------------+----------+--------+ Antecubital fossa    0.49                        +-----------------+-------------+----------+--------+ Prox forearm         0.19                        +-----------------+-------------+----------+--------+ Mid forearm          0.25                        +-----------------+-------------+----------+--------+ Distal forearm       0.20                        +-----------------+-------------+----------+--------+ +-----------------+-------------+----------+---------+ Left Cephalic    Diameter (cm)Depth (cm)Findings  +-----------------+-------------+----------+---------+ Shoulder             0.35        1.30             +-----------------+-------------+----------+---------+ Prox upper arm       0.37        1.30             +-----------------+-------------+----------+---------+ Mid upper arm        0.31        0.87             +-----------------+-------------+----------+---------+ Dist upper arm       0.27        0.56             +-----------------+-------------+----------+---------+ Antecubital fossa    0.31               branching +-----------------+-------------+----------+---------+ Prox forearm         0.22        0.77             +-----------------+-------------+----------+---------+ Mid forearm          0.11                          +-----------------+-------------+----------+---------+ Dist forearm         0.12                         +-----------------+-------------+----------+---------+ +-----------------+-------------+----------+--------------------+ Left Basilic     Diameter (cm)Depth (cm)      Findings       +-----------------+-------------+----------+--------------------+ Prox upper arm  0.66                                    +-----------------+-------------+----------+--------------------+ Mid upper arm        0.38                                    +-----------------+-------------+----------+--------------------+ Dist upper arm       0.33                                    +-----------------+-------------+----------+--------------------+ Antecubital fossa    0.28                    branching       +-----------------+-------------+----------+--------------------+ Prox forearm         0.25                                    +-----------------+-------------+----------+--------------------+ Mid forearm          0.24               vein tapers anterior +-----------------+-------------+----------+--------------------+ Distal forearm       0.23                    branching       +-----------------+-------------+----------+--------------------+ Elbow                0.18                                    +-----------------+-------------+----------+--------------------+ *See table(s) above for measurements and observations.  Diagnosing physician: Deitra Mayo MD Electronically signed by Deitra Mayo MD on 04/01/2022 at 6:09:43 PM.    Final         Scheduled Meds:  amiodarone  200 mg Oral Daily   aspirin EC  81 mg Oral Q breakfast   atorvastatin  80 mg Oral Daily   Chlorhexidine Gluconate Cloth  6 each Topical Q0600   darbepoetin (ARANESP) injection - DIALYSIS  100 mcg Subcutaneous Q Thu-1800   fidaxomicin  200 mg Oral BID   heparin  5,000 Units  Subcutaneous Q12H   heparin sodium (porcine)       insulin aspart  0-15 Units Subcutaneous TID WC   insulin aspart  0-5 Units Subcutaneous QHS   insulin glargine-yfgn  17 Units Subcutaneous QHS   levothyroxine  50 mcg Oral Q0600   linagliptin  5 mg Oral Daily   pregabalin  150 mg Oral BID   sevelamer carbonate  1,600 mg Oral TID WC   ursodiol  300 mg Oral Daily   vortioxetine HBr  20 mg Oral Daily   Continuous Infusions:     LOS: 4 days    Time spent: over 30 min    Nita Sells, MD [facility]   To contact the attending provider between 7A-7P or the covering provider during after hours 7P-7A, please log into the web site [email or web address] and access using universal [place] password for that web site. If you do not have the password, please call the hospital operator.  04/03/2022, 11:16 AM

## 2022-04-03 NOTE — Progress Notes (Signed)
   04/03/22 0000  Vitals  Temp 98.6 F (37 C)  Temp Source Oral  BP (!) 124/48  MAP (mmHg) 71  BP Location Right Arm  BP Method Automatic  Patient Position (if appropriate) Lying  Pulse Rate 74  Pulse Rate Source Monitor  ECG Heart Rate 75  Resp 12  Post Treatment  Dialyzer Clearance Lightly streaked  Duration of HD Treatment -hour(s) 2 hour(s)  Liters Processed 30  Fluid Removed (mL) 1000 mL  Tolerated HD Treatment Yes   1 st TX w/o difficulty.

## 2022-04-04 DIAGNOSIS — N179 Acute kidney failure, unspecified: Secondary | ICD-10-CM | POA: Diagnosis not present

## 2022-04-04 LAB — GLUCOSE, CAPILLARY
Glucose-Capillary: 296 mg/dL — ABNORMAL HIGH (ref 70–99)
Glucose-Capillary: 229 mg/dL — ABNORMAL HIGH (ref 70–99)
Glucose-Capillary: 170 mg/dL — ABNORMAL HIGH (ref 70–99)
Glucose-Capillary: 185 mg/dL — ABNORMAL HIGH (ref 70–99)
Glucose-Capillary: 217 mg/dL — ABNORMAL HIGH (ref 70–99)

## 2022-04-04 LAB — TYPE AND SCREEN
ABO/RH(D): A POS
Antibody Screen: POSITIVE
DAT, IgG: NEGATIVE
Donor AG Type: NEGATIVE
Donor AG Type: NEGATIVE
Unit division: 0
Unit division: 0

## 2022-04-04 LAB — CBC
HCT: 25.8 % — ABNORMAL LOW (ref 36.0–46.0)
Hemoglobin: 8.1 g/dL — ABNORMAL LOW (ref 12.0–15.0)
MCH: 25.5 pg — ABNORMAL LOW (ref 26.0–34.0)
MCHC: 31.4 g/dL (ref 30.0–36.0)
MCV: 81.1 fL (ref 80.0–100.0)
Platelets: 121 10*3/uL — ABNORMAL LOW (ref 150–400)
RBC: 3.18 MIL/uL — ABNORMAL LOW (ref 3.87–5.11)
RDW: 16.8 % — ABNORMAL HIGH (ref 11.5–15.5)
WBC: 8.9 10*3/uL (ref 4.0–10.5)
nRBC: 0 % (ref 0.0–0.2)

## 2022-04-04 LAB — RENAL FUNCTION PANEL
Albumin: 2 g/dL — ABNORMAL LOW (ref 3.5–5.0)
Anion gap: 15 (ref 5–15)
BUN: 152 mg/dL — ABNORMAL HIGH (ref 8–23)
CO2: 20 mmol/L — ABNORMAL LOW (ref 22–32)
Calcium: 7.6 mg/dL — ABNORMAL LOW (ref 8.9–10.3)
Chloride: 98 mmol/L (ref 98–111)
Creatinine, Ser: 6.45 mg/dL — ABNORMAL HIGH (ref 0.44–1.00)
GFR, Estimated: 6 mL/min — ABNORMAL LOW (ref >60)
Glucose, Bld: 310 mg/dL — ABNORMAL HIGH (ref 70–99)
Phosphorus: 8.1 mg/dL — ABNORMAL HIGH (ref 2.5–4.6)
Potassium: 3.8 mmol/L (ref 3.5–5.1)
Sodium: 133 mmol/L — ABNORMAL LOW (ref 135–145)

## 2022-04-04 LAB — BPAM RBC
Blood Product Expiration Date: 202312052359
Blood Product Expiration Date: 202312052359
Unit Type and Rh: 5100
Unit Type and Rh: 5100

## 2022-04-04 MED ORDER — INSULIN ASPART 100 UNIT/ML IJ SOLN
10.0000 [IU] | Freq: Once | INTRAMUSCULAR | Status: AC
  Administered 2022-04-04: 10 [IU] via SUBCUTANEOUS

## 2022-04-04 MED ORDER — INSULIN ASPART 100 UNIT/ML IJ SOLN
3.0000 [IU] | Freq: Three times a day (TID) | INTRAMUSCULAR | Status: DC
  Administered 2022-04-04 – 2022-04-05 (×4): 3 [IU] via SUBCUTANEOUS

## 2022-04-04 MED ORDER — INSULIN ASPART 100 UNIT/ML IJ SOLN: 0.0000 [IU] | Freq: Three times a day (TID) | INTRAMUSCULAR | Status: DC

## 2022-04-04 MED ORDER — HEPARIN SODIUM (PORCINE) 1000 UNIT/ML IJ SOLN
INTRAMUSCULAR | Status: AC
  Administered 2022-04-04: 3800 [IU]
  Filled 2022-04-04: qty 4

## 2022-04-04 NOTE — Progress Notes (Signed)
CBG 296, no coverage ordered. New order for Novolog 10 units by [facility], MD.

## 2022-04-04 NOTE — TOC Progression Note (Signed)
Transition of Care [facility]) - Initial/Assessment Note    Patient Details  Name: Samayra Hebel MRN: [id number] Date of Birth: 1950/09/20  [facility]) CM/SW Contact:    Milinda Antis, LCSWA Phone Number: 04/04/2022, 10:57 AM  Clinical Narrative:                 LCSW met with patient on dialysis unit.  Patient declined SNF and wants home with home health.  RNCM notified.   Patient Goals and CMS Choice        Expected Discharge Plan and Services                                                Prior Living Arrangements/Services                       Activities of Daily Living Home Assistive Devices/Equipment: None ADL Screening (condition at time of admission) Patient's cognitive ability adequate to safely complete daily activities?: Yes Is the patient deaf or have difficulty hearing?: No Does the patient have difficulty seeing, even when wearing glasses/contacts?: No Does the patient have difficulty concentrating, remembering, or making decisions?: No Patient able to express need for assistance with ADLs?: Yes Does the patient have difficulty dressing or bathing?: Yes Independently performs ADLs?: No Communication: Independent Dressing (OT): Needs assistance Is this a change from baseline?: Change from baseline, expected to last >3 days Grooming: Needs assistance Is this a change from baseline?: Change from baseline, expected to last >3 days Feeding: Independent Bathing: Needs assistance Is this a change from baseline?: Change from baseline, expected to last >3 days Toileting: Needs assistance Is this a change from baseline?: Change from baseline, expected to last >3days In/Out Bed: Dependent Is this a change from baseline?: Change from baseline, expected to last >3 days Walks in Home: Dependent Is this a change from baseline?: Change from baseline, expected to last >3 days Does the patient have difficulty walking or climbing stairs?:  Yes Weakness of Legs: Both Weakness of Arms/Hands: Both  Permission Sought/Granted                  Emotional Assessment              Admission diagnosis:  AKI (acute kidney injury) ([place]) [N17.9] Venous stasis ulcer of right calf, unspecified ulcer stage, unspecified whether varicose veins present ([place]) [I83.012, L97.219] Patient Active Problem List   Diagnosis Date Noted   Venous stasis ulcer of right calf ([place]) 03/31/2022   Fistula, colovaginal 03/26/2022   Diarrhea 03/26/2022   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   Sepsis without acute organ dysfunction ([place])    Bacteremia    Acute pancreatitis 02/01/2022   Abdominal pain 02/01/2022   SIRS (systemic inflammatory response syndrome) ([place]) 02/01/2022   Transaminitis 02/01/2022   History of anemia due to chronic kidney disease 02/01/2022   Paroxysmal atrial fibrillation ([place]) 02/01/2022   DKA (diabetic ketoacidosis) ([place]) 01/14/2022   NSTEMI (non-ST elevated myocardial infarction) ([place]) 03/05/2021   Acute renal failure superimposed on stage 4 chronic kidney disease ([place]) 08/22/2020   Hypoalbuminemia 05/25/2020   GERD (gastroesophageal reflux disease) 05/25/2020   Pressure injury of skin 05/17/2020   Type 2 diabetes mellitus with diabetic polyneuropathy, with long-term current use of insulin ([place]) 03/07/2020   Obesity, Class III, BMI 40-49.9 (morbid obesity) ([place]) 03/07/2020  Common bile duct (CBD) obstruction 05/28/2019   Benign neoplasm of ascending colon    Benign neoplasm of transverse colon    Benign neoplasm of descending colon    Benign neoplasm of sigmoid colon    Gastric polyps    Hyperkalemia 03/11/2019   Prolonged QT interval 03/11/2019   Onychomycosis 06/21/2018   Osteomyelitis of second toe of right foot ([place])    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 (HCC)    PVD (peripheral vascular disease) ([place]) 10/26/2017   E-coli UTI 07/27/2017   Hypothyroidism 07/27/2017   AKI (acute  kidney injury) ([place])    [place] (pulmonary artery hypertension) (HCC)    Impaired ambulation 07/19/2017   Nausea & vomiting 07/15/2017   Leg cramps 02/27/2017   Peripheral edema 01/12/2017   Diabetic neuropathy ([place]) 11/12/2016   CKD (chronic kidney disease), stage IV ([place]) 10/24/2015   Anemia 10/03/2015   Generalized anxiety disorder 10/03/2015   Insomnia 10/03/2015   Hyperglycemia due to diabetes mellitus ([place]) 06/07/2015   Chronic diastolic CHF (congestive heart failure) ([place]) 06/07/2015   Non compliance with medical treatment 04/17/2014   Rotator cuff tear 03/14/2014   Class 3 obesity ([place]) 09/23/2013   Chronic HFrEF (heart failure with reduced ejection fraction) ([place]) 06/03/2013   Hypotension 12/25/2012   Urinary incontinence    MDD (major depressive disorder) 11/12/2010   RBBB (right bundle branch block)    Coronary artery disease    Hyperlipemia 01/22/2009   Essential hypertension 01/22/2009   PCP:  [name], MD Pharmacy:   [id number] [address] [place]Phone: [phone]: [phone] [facility] [address] [id number]: [phone]: [phone]    Social Determinants of Health (SDOH) Interventions    Readmission Risk Interventions    02/07/2022    2:57 PM 01/23/2022   12:38 PM 05/28/2020   12:50 PM  Readmission Risk Prevention Plan  Transportation Screening Complete Complete Complete  PCP or Specialist Appt within 3-5 Days  Complete   [place]RI or [place] Complete   Social Work Consult for RB[place]auPlanning/Counseling  Complete   Palliative Care Screening  Not Applicable   Medication Review (Press photographer Complete Complete Complete  PCP or Specialist appointment within 3-5 days of discharge Complete  Complete  HRI or Home Care Consult Complete  Complete  SW Recovery  Care/Counseling Consult Complete  Complete  Palliative Care Screening Not Applicable  Not APoint ComfortNot Applicable  Not Applicable

## 2022-04-04 NOTE — Inpatient Diabetes Management (Signed)
Inpatient Diabetes Program Recommendations  AACE/ADA: New Consensus Statement on Inpatient Glycemic Control (2015)  Target Ranges:  Prepandial:   less than 140 mg/dL      Peak postprandial:   less than 180 mg/dL (1-2 hours)      Critically ill patients:  140 - 180 mg/dL    Latest Reference Range & Units 04/03/22 07:31 04/03/22 11:13 04/03/22 16:23 04/03/22 21:25 04/03/22 22:39 04/04/22 03:17 04/04/22 06:44  Glucose-Capillary 70 - 99 mg/dL 362 (H) 342 (H) 344 (H) 361 (H) 373 (H) 296 (H) 229 (H)    History: DM type 1, CKD, CHF  Home DM Meds: Freestyle Libre 2 CGM       Levemir 15 units BID       Novolog 0-9 TID per SSI       Insulin Pump Last Admission (Sept 2023) Insulin pump settings are:  Omnipod MN-5A- 1.05 units/hr  5A-9A- 1.15 units/hr  9A-7P- 1.9 units/hr  7P-MN- 1.1 unit/hr  Total 24 hour basal is 34.35 units/24 hours Unsure of bolus settings Pt uses Bolus feature with CBG results alone--does not bolus for Carbohydrates        Current Orders: Novolog Sensitive Correction Scale/ SSI (0-9 units) TID AC     Tradjenta 5 mg daily     Semglee 22 units qhs   Based on insulin pump settings consider:  -    Increasing Semglee to 30 units Daily   -    d/c the Novolog Sensitive 0-9 units tid Correction scale    --Will follow patient during hospitalization--  Tama Headings RN, MSN, BC-ADM Inpatient Diabetes Coordinator Team Pager [phone] (8a-5p)

## 2022-04-04 NOTE — Progress Notes (Signed)
PROGRESS NOTE    Susan [name]  JAS:[id number] DOB: 18-Nov-1950 DOA: 03/30/2022 PCP: [name], MD  Chief Complaint  Patient presents with   Wound Check    Brief Narrative:  Susan [name] is a 71 y.o. female with medical history significant of PAF on Eliquis, CKD stage IV, CAD, chronic HFpEF, morbid obesity, IIDM, chronic venous stasis wound on right leg, recent recurrent C. difficile colitis, recurrent UTIs, presented with bleeding from the right leg venous stasis ulcer.   Hospitalization also complicated by worsening renal function with uremia.  Renal following, planning for trial of lasix and likely will need dialysis.  Vascular has been consulted for access + TDC.   Assessment & Plan:   Principal Problem:   AKI (acute kidney injury) ([place]) Active Problems:   Venous stasis ulcer of right calf (HCC)   Assessment and Plan: Acute Kidney Injury on CKD IV or V  Uremia Baseline creatinine appears to be ~3-5 over the past year or so, recently worsening with creatinine 7.09 + asterixis, mild confusion s from PTA is now resolved Appreciate renal assistance, diuretic challenge with 120 mg lasix q8  Unfortunately, she did not respond - initiate dialysis Seen by vascular surgery and underwent AV fistula and TDC placement on 11/8  Chronic HFrEF (EF 30%) - Echo 01/2022 TEE with EF 30%, RVSF moderately reduced (see report) - case discussed by Dr. Augustin Coupe with Dr. Aundra Dubin of advanced HF service who did not believe patient would benefit from inotropes and not candidate for home inotropes.  Right leg venous ulcer bleeding  Bleeding from Varicose Veins secondary to Anticoagulation therapy for Atrial Fibrillation -holding eliquis for now -s/p DDAVP in setting of her uremia -appreciate orthopedic assistance, recommending UNNA boots and prafo boot for right, bleeding appears to have subsided--dressing changes as per Ortho---she can weight-bear as tolerated and patient has been told to  mobilize -left leg ulcer treated with Kerecis micro graft  -follow up with ortho next week for reapplication Kerecis tissue graft left leg   Acute Blood Loss Anemia Stable, but fluctuating Follow type and screen, repeat H/H Transfuse prn for Hemoglobin below 7   PAF, CHad2Vasc2>4 - Hold off Eliquis due to bleeding above, will resume in the next 24 to 48 hours - Continue amiodarone 200 qd   HTN -Blood pressure borderline low, hold off home BP meds, start as needed hydralazine -low threshold for transfusion   Recurrent C. difficile colitis -Continue for Fidaxomycin -saw ID on 11/1, was prescribed fidaxomicin on 11/3 for her C difficile --will need to complete 10 days on 11/12 and then re-eval   Concern for Colovesical Fistula - discussed with urology,--requires outpatient cystogram-- will defer for now until renal function/volume status have stabilized  IDDM hyperglycemia -has insulin pump, protocol for patient administration ordered  - pump discontinued 11/7 -started on basal/bolus insulin--Sugars 200-3 73 , on LA 22 U--placed on mealtime coverage in addition --started Tradjenta 5 this admit -Transition back to pump on 11/11   History of recurrent UTI -On chronic suppression antibiotics, but we will hold off MicroBid for now   Gout -Hold off colchicine due to AKI  Depression -continues on Trintellix 20 as per Prior meds    DVT prophylaxis: heparin Code Status: full Family Communication: called 11/10 to d/w husband Percell Miller [phone] but no answer Disposition:   Status is: Inpatient Remains inpatient appropriate because: pending further improvement as well as planning in terms of clip for discharge   Consultants:  Renal Ortho Vascular surgery  Procedures:  11/8 Left arm AV fistula  11/8 TDC placement  Antimicrobials:  Anti-infectives (From admission, onward)    Start     Dose/Rate Route Frequency Ordered Stop   04/02/22 0000  vancomycin (VANCOCIN) IVPB  1000 mg/200 mL premix        1,000 mg 200 mL/hr over 60 Minutes Intravenous To Surgery 04/01/22 1513 04/02/22 1057   03/30/22 2200  fidaxomicin (DIFICID) tablet 200 mg        200 mg Oral 2 times daily 03/30/22 1759 04/06/22 2159       Subjective:  Seen on HD unit much more awake-able to tell me time place person No fever no chills Lab recommended that she get up and move around-she tells me she does not really want to go to skilled facility Patient had some oozing from fistula of the left hand but seems to have completely stopped now-she is coherent alert and in no distress   Objective: Vitals:   04/04/22 0441 04/04/22 0800 04/04/22 0812 04/04/22 0830  BP: (!) 102/46 (!) 124/45 (!) 135/54   Pulse: 66 67 66 69  Resp: 18     Temp: 97.9 F (36.6 C) 97.7 F (36.5 C)    TempSrc:  Oral    SpO2: 96% 98% 100% 95%  Weight:  126.3 kg    Height:        Intake/Output Summary (Last 24 hours) at 04/04/2022 0904 Last data filed at 04/04/2022 3220 Gross per 24 hour  Intake 480 ml  Output 350 ml  Net 130 ml    Filed Weights   04/02/22 2143 04/03/22 0000 04/04/22 0800  Weight: 126.7 kg 125.7 kg 126.3 kg    Examination:  EOMI NCAT thick neck  More coherent alert no distress Chest is clear no rales no rhonchi Getting HD through the right chest access Left arm fistula seems clean without issue and no bleeding     Data Reviewed: I have personally reviewed following labs and imaging studies  CBC: Recent Labs  Lab 03/30/22 1018 03/30/22 1720 03/31/22 0414 03/31/22 1528 04/01/22 0756 04/01/22 1505 04/02/22 0703 04/03/22 0528 04/04/22 0349  WBC 8.4  --  9.8  --  9.7  --  10.2 7.3 8.9  NEUTROABS 6.9  --   --   --  7.7  --  8.3*  --   --   HGB 8.7*   < > 7.5*   < > 7.3* 7.9* 7.8* 7.9* 8.1*  HCT 28.0*   < > 24.1*   < > 23.6* 25.2* 25.0* 25.1* 25.8*  MCV 84.3  --  83.7  --  82.2  --  82.5 82.0 81.1  PLT 116*  --  104*  --  103*  --  107* 107* 121*   < > = values in this  interval not displayed.     Basic Metabolic Panel: Recent Labs  Lab 03/31/22 0414 04/01/22 0756 04/02/22 0703 04/03/22 0528 04/04/22 0349  NA 139 138 136 133* 133*  K 3.4* 3.5 3.5 3.7 3.8  CL 101 103 101 95* 98  CO2 19* 19* 18* 18* 20*  GLUCOSE 275* 190* 133* 354* 310*  BUN 180* 183* 183* 143* 152*  CREATININE 7.09* 7.14* 7.42* 5.89* 6.45*  CALCIUM 6.9* 6.9* 7.1* 7.3* 7.6*  MG  --  2.2 2.2  --   --   PHOS  --  7.9* 8.7* 8.1* 8.1*     GFR: Estimated Creatinine Clearance: 10.5 mL/min (A) (by C-G formula based  on SCr of 6.45 mg/dL (H)).  Liver Function Tests: Recent Labs  Lab 04/01/22 0756 04/02/22 0703 04/03/22 0528 04/04/22 0349  AST 14* 15  --   --   ALT 15 17  --   --   ALKPHOS 92 96  --   --   BILITOT 0.6 0.7  --   --   PROT 5.0* 4.9*  --   --   ALBUMIN 2.1* 2.1* 2.0* 2.0*     CBG: Recent Labs  Lab 04/03/22 1623 04/03/22 2125 04/03/22 2239 04/04/22 0317 04/04/22 0644  GLUCAP 344* 361* 373* 296* 229*      No results found for this or any previous visit (from the past 240 hour(s)).       Radiology Studies: DG Chest Port 1 View  Result Date: 04/02/2022 CLINICAL DATA:  Dialysis catheter insertion EXAM: PORTABLE CHEST 1 VIEW COMPARISON:  02/01/2022 FINDINGS: Right internal jugular catheter in place with the tip at the SVC RA junction. No pneumothorax. Cardiomegaly. Aortic atherosclerosis. Possible venous hypertension but without frank edema. No infiltrate, collapse or effusion. IMPRESSION: 1. Right internal jugular catheter tip at the SVC RA junction. No pneumothorax. 2. Cardiomegaly. Possible venous hypertension. Electronically Signed   By: Nelson Chimes M.D.   On: 04/02/2022 15:15   DG C-Arm 1-60 Min  Result Date: 04/02/2022 CLINICAL DATA:  ESRD.  Dialysis catheter insertion EXAM: DG C-ARM 1-60 MIN COMPARISON:  Chest XR, 02/01/2022 FLUOROSCOPY: Exposure Index (as provided by the fluoroscopic device): 2.94 MGy Kerma FINDINGS: Single, limited planar  imaging of the RIGHT chest. Image demonstrating RIGHT IJ approach tunneled dialysis catheter placement. Tip not fully imaged, though appears to be within RIGHT atrium. IMPRESSION: Fluoroscopic imaging for RIGHT tunneled dialysis catheter placement. For complete description of intra procedural findings, please see performing service dictation. Electronically Signed   By: Michaelle Birks M.D.   On: 04/02/2022 14:24        Scheduled Meds:  (feeding supplement) PROSource Plus  30 mL Oral Daily   amiodarone  200 mg Oral Daily   aspirin EC  81 mg Oral Q breakfast   atorvastatin  80 mg Oral Daily   Chlorhexidine Gluconate Cloth  6 each Topical Q0600   darbepoetin (ARANESP) injection - DIALYSIS  100 mcg Subcutaneous Q Thu-1800   fidaxomicin  200 mg Oral BID   heparin  5,000 Units Subcutaneous Q12H   insulin aspart  0-15 Units Subcutaneous TID WC   insulin aspart  0-5 Units Subcutaneous QHS   insulin glargine-yfgn  22 Units Subcutaneous QHS   levothyroxine  50 mcg Oral Q0600   linagliptin  5 mg Oral Daily   multivitamin  1 tablet Oral QHS   pregabalin  75 mg Oral Daily   sevelamer carbonate  1,600 mg Oral TID WC   ursodiol  300 mg Oral Daily   vortioxetine HBr  20 mg Oral Daily   Continuous Infusions:     LOS: 5 days    Time spent: over 30 min    Nita Sells, MD [facility]   To contact the attending provider between 7A-7P or the covering provider during after hours 7P-7A, please log into the web site [email or web address] and access using universal [place] password for that web site. If you do not have the password, please call the hospital operator.  04/04/2022, 9:04 AM

## 2022-04-04 NOTE — TOC Progression Note (Signed)
Transition of Care [facility]) - Progression Note    Patient Details  Name: Susan [name] MRN: [id number] Date of Birth: [birth year]  Transition of Care [facility]) CM/SW Contact  Tom-Johnson, Renea Ee, RN Phone Number: 04/04/2022, 12:32 PM  Clinical Narrative:     CM notified by LCSW that patient declined SNF and requesting Home health. CM spoke with patient and she endorsed home health,  stating her husband will take care of her at home. Patient gave CM permission to speak with her husband, Susan [name]. CM spoke with  Susan [name] via phone [phone]) and he also endorsed home health and agreed to take care of patient at home. Susan [name] states patient is active with [place] with PT/RN disciplines and would like to resume services at discharge. CM called in referral to [facility] and acceptance voiced, info on AVS. Patient has all necessary DME's at home.  CM will continue to follow as patient progresses with care towards discharge.       Expected Discharge Plan: [place] Barriers to Discharge: Continued Medical Work up  Expected Discharge Plan and Services Expected Discharge Plan: [place]   Discharge Planning Services: CM Consult Post Acute Care Choice: [place] arrangements for the past 2 months: Single Family Home                 DME Arranged: N/A DME Agency: NA       HH Arranged: PT, OT, RN [place] Agency: [place] Date HH Agency Contacted: 04/04/22 Time [place]: 1034 Representative spoke with at [place]: [place] ([place]) Interventions    Readmission Risk Interventions    02/07/2022    2:57 PM 01/23/2022   12:38 PM 05/28/2020   12:50 PM  Readmission Risk Prevention Plan  Transportation Screening Complete Complete Complete  PCP or Specialist Appt within 3-5 Days  Complete   HRI or [place]  Complete   Social Work Consult for [place] Planning/Counseling  Complete   Palliative  Care Screening  Not Applicable   Medication Review Press photographer) Complete Complete Complete  PCP or Specialist appointment within 3-5 days of discharge Complete  Complete  HRI or [place] Complete  Complete  SW Recovery Care/Counseling Consult Complete  Complete  Palliative Care Screening Not Applicable  Not [place] Not Applicable  Not Applicable

## 2022-04-04 NOTE — Progress Notes (Signed)
Received patient in bed to unit.  Alert and oriented.  Informed consent signed and in chart.   Treatment initiated: 0812 Treatment completed: 1114  Patient tolerated well.  Transported back to the room  Alert, without acute distress.  Hand-off given to patient's nurse.   Access used: Catheter Access issues: none  Total UF removed: 2L Medication(s) given: none Post HD VS: 143/46,73,97%98.4 Post HD weight: 124.4kg   [facility]

## 2022-04-04 NOTE — Progress Notes (Addendum)
[facility] Progress Note   71 y.o. 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 Hollie Salk with [facility].  She has issues with fluid and salt restriction in the setting of CHF also followed by Dr. Haroldine Laws. Torsemide dose is quite high at '80mg'$  BID and she's not responding well recently. She has had positive urine culture with Enterobacter aerogenes S to quinolones, gentamycin, tobramycin, and bactrim.  Here with bleeding from a wound on the right found noted to be tearing of the skin.  She had UNNA boots but the right one was removed in the ED because of the wound.     Assessment/ Plan:   H/o advanced CKD IV/V with a baseline creatinine in the 3.5-4.2 range in 01/2022 which has now progressed to ESRD. Renal function has been steadily worsening and in the 4.8-5.1 range in 02/2022. Again appears to have progressive CKD to ESRD; I d/w the advanced HF team and confirmed that there is no maneuvers or treatments possible from the HF end. Fortunately no absolute indications for dialysis at this time but certainly possible she may need it this hospitalization. She was tearful when I explained this but expressed understanding. - Not a good response to Lasix '120mg'$  -> stopped the Metolazone and Lasix. - Appreciate VVS Dr. Trula Slade placing RIJ TC and 1st stage BBF on 11/8. Patient rx HD #1 very late on 11/8 and didn't complete the treatment till after MN.   Seen on 2nd treatment HD today 3K bath 2L goal net 143/53 RIJ TC  HD #3 on Sat.   - Currently in process of CLIP as well; she doesn't want to go to a SNF even tho her spouse works. CSW to work on home health as she will need some assistance.   - 2gm Na diet and fluid restrict to 1L per  day -Monitor Daily I/Os, Daily weight  -Maintain MAP>65 for optimal renal perfusion.  - Avoid nephrotoxic agents such as IV contrast, NSAIDs, and phosphate containing bowel preps (FLEETS)   HFrEF with EF 30% in the setting of severe TR more refractory to diuretics at this time. Renal osteodystrophy - will check a iPTH, phos 7.9 -> started Renvela 2 TIDM but not eating much w/ the nausea. Will need education of higher phos foods to avoid Anemia - 7% sats (needs iron, will load with Ferrlecit '250mg'$  x2 given she received 2 units on 11/6). She rx #2/2 of the Ferrlecit. 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 Severe TR DM  PAF on Eliquis  Subjective:   Nausea much better last night but back this AM; she thinks it's bec she didn't get breakfast. Denies SOB.   Objective:   BP (!) 146/43   Pulse 72   Temp 97.7 F (36.5 C) (Oral)   Resp 18   Ht '5\' 5"'$  (1.651 m)   Wt 126.3 kg   SpO2 97%   BMI 46.34 kg/m   Intake/Output Summary (Last 24 hours) at 04/04/2022 1032 Last data filed at 04/04/2022 0705 Gross per 24 hour  Intake 480 ml  Output 350 ml  Net 130 ml   Weight change:   Physical Exam: GEN: NAD, supin in bed on hd HEENT: EOMI NECK: supple, JVD present RESP:clear  bilaterally no c/w/r CV: RRR, I/V systolic murmur today no rub or gallop appreciated ABD: soft, obese, NTND, +BS, no guarding or rebound tenderness LE: 1+ LE edema with a superficial skin tear in the right foot NEURO: no asterixis ACCESS: RIJ TC, good thrill in left BBF  Imaging: DG Chest Port 1 View  Result Date: 04/02/2022 CLINICAL DATA:  Dialysis catheter insertion EXAM: PORTABLE CHEST 1 VIEW COMPARISON:  02/01/2022 FINDINGS: Right internal jugular catheter in place with the tip at the SVC RA junction. No pneumothorax. Cardiomegaly. Aortic atherosclerosis. Possible venous hypertension but without frank edema. No infiltrate, collapse or effusion. IMPRESSION: 1. Right internal jugular catheter tip at the SVC RA  junction. No pneumothorax. 2. Cardiomegaly. Possible venous hypertension. Electronically Signed   By: Nelson Chimes M.D.   On: 04/02/2022 15:15   DG C-Arm 1-60 Min  Result Date: 04/02/2022 CLINICAL DATA:  ESRD.  Dialysis catheter insertion EXAM: DG C-ARM 1-60 MIN COMPARISON:  Chest XR, 02/01/2022 FLUOROSCOPY: Exposure Index (as provided by the fluoroscopic device): 2.94 MGy Kerma FINDINGS: Single, limited planar imaging of the RIGHT chest. Image demonstrating RIGHT IJ approach tunneled dialysis catheter placement. Tip not fully imaged, though appears to be within RIGHT atrium. IMPRESSION: Fluoroscopic imaging for RIGHT tunneled dialysis catheter placement. For complete description of intra procedural findings, please see performing service dictation. Electronically Signed   By: Michaelle Birks M.D.   On: 04/02/2022 14:24    Labs: BMET Recent Labs  Lab 03/30/22 1018 03/31/22 0414 04/01/22 0756 04/02/22 0703 04/03/22 0528 04/04/22 0349  NA 136 139 138 136 133* 133*  K 3.4* 3.4* 3.5 3.5 3.7 3.8  CL 97* 101 103 101 95* 98  CO2 21* 19* 19* 18* 18* 20*  GLUCOSE 261* 275* 190* 133* 354* 310*  BUN 177* 180* 183* 183* 143* 152*  CREATININE 7.14* 7.09* 7.14* 7.42* 5.89* 6.45*  CALCIUM 6.9* 6.9* 6.9* 7.1* 7.3* 7.6*  PHOS  --   --  7.9* 8.7* 8.1* 8.1*   CBC Recent Labs  Lab 03/30/22 1018 03/30/22 1720 04/01/22 0756 04/01/22 1505 04/02/22 0703 04/03/22 0528 04/04/22 0349  WBC 8.4   < > 9.7  --  10.2 7.3 8.9  NEUTROABS 6.9  --  7.7  --  8.3*  --   --   HGB 8.7*   < > 7.3* 7.9* 7.8* 7.9* 8.1*  HCT 28.0*   < > 23.6* 25.2* 25.0* 25.1* 25.8*  MCV 84.3   < > 82.2  --  82.5 82.0 81.1  PLT 116*   < > 103*  --  107* 107* 121*   < > = values in this interval not displayed.    Medications:     (feeding supplement) PROSource Plus  30 mL Oral Daily   amiodarone  200 mg Oral Daily   aspirin EC  81 mg Oral Q breakfast   atorvastatin  80 mg Oral Daily   Chlorhexidine Gluconate Cloth  6 each Topical  Q0600   darbepoetin (ARANESP) injection - DIALYSIS  100 mcg Subcutaneous Q Thu-1800   fidaxomicin  200 mg Oral BID   heparin  5,000 Units Subcutaneous Q12H   insulin aspart  0-15 Units Subcutaneous TID WC   insulin aspart  0-9 Units Subcutaneous TID WC   insulin aspart  3 Units Subcutaneous TID WC   insulin glargine-yfgn  22 Units Subcutaneous QHS   levothyroxine  50 mcg Oral Q0600   linagliptin  5 mg Oral Daily   multivitamin  1 tablet Oral  QHS   pregabalin  75 mg Oral Daily   sevelamer carbonate  1,600 mg Oral TID WC   ursodiol  300 mg Oral Daily   vortioxetine HBr  20 mg Oral Daily      [name], MD 04/04/2022, 10:32 AM

## 2022-04-04 NOTE — Progress Notes (Signed)
Pt has been accepted at [facility]) on TTS 10:30 chair time. Pt can start on Tuesday and will need to arrive at 10:00 to complete paperwork prior to treatment. Met with pt at bedside to go over above details. Pt agreeable to plan and schedule letter provided. Offered to call pt's husband to discuss arrangements but pt states husband will call navigator if he has questions (pt has number). Clinic aware pt may d/c this weekend. Arrangements added to pt's AVS as well. Contacted renal PA regarding clinic's need for orders if pt to d/c over the weekend.   Melven Sartorius Renal Navigator [phone]

## 2022-04-04 NOTE — Evaluation (Signed)
Occupational Therapy Evaluation Patient Details Name: Susan [name] MRN: [id number] DOB: May 18, 1951 Today's Date: 04/04/2022   History of Present Illness 71 y/o female presented to ED on 03/30/22 for blister on R heel with drainage. Found to be bleeding from varicose veins 2/2 anticoagulation for Afib. Admitted for AKI also. PMH: hx of MI, T2DM, CHF, CAD, HTN, TIA, venous insufficiency, CKD   Clinical Impression   PTA, pt lived with her husband and son and was independent in ADL and IADL until recently in which she has received assistance with all ADL. Upon eval, pt presents with decreased safety, awareness, strength, balance, activity tolerance, and pain. Pt performing bed mobility with max A and requiring min-max A to maintain balance sitting EOB throughout session. Transfers deferred due to pt with max BLE pain. Pt requiring max-total A for LB Adl and max A for UB ADL in seated position. Recommending SNF for continued OT services. Pt and daughter hesitant on rehab vs home, but unable to identify needed level of physical support during eval, as pt wopuld require assist with toileting, UB ADL and LB ADL.      Recommendations for follow up therapy are one component of a multi-disciplinary discharge planning process, led by the attending physician.  Recommendations may be updated based on patient status, additional functional criteria and insurance authorization.   Follow Up Recommendations  Skilled nursing-short term rehab (<3 hours/day) (Pt's daughter reporting pt not likely to agree, but unable to identify who could provide assist at home.)    Assistance Recommended at Discharge Intermittent Supervision/Assistance  Patient can return home with the following Two people to help with walking and/or transfers;Two people to help with bathing/dressing/bathroom;Assistance with cooking/housework;Direct supervision/assist for medications management;Direct supervision/assist for financial management;Assist  for transportation;Help with stairs or ramp for entrance    Functional Status Assessment  Patient has had a recent decline in their functional status and/or demonstrates limited ability to make significant improvements in function in a reasonable and predictable amount of time  Equipment Recommendations  None recommended by OT (Pt has recommended equipment)    Recommendations for Other Services       Precautions / Restrictions Precautions Precautions: Fall Precaution Comments: B unna boots Restrictions Weight Bearing Restrictions: No      Mobility Bed Mobility Overal bed mobility: Needs Assistance Bed Mobility: Supine to Sit, Sit to Supine     Supine to sit: Max assist, +2 for physical assistance, +2 for safety/equipment Sit to supine: Total assist   General bed mobility comments: able to advance LEs with assistance and step by step cueing. Overall required maxA to come to EOB and totalA to return to supine as patient not assisting    Transfers                   General transfer comment: deferred. Pt refusing due to BLE pain      Balance Overall balance assessment: Needs assistance Sitting-balance support: Bilateral upper extremity supported, Feet supported Sitting balance-Leahy Scale: Poor Sitting balance - Comments: min-max A for static sitting balance. Postural control: Posterior lean                                 ADL either performed or assessed with clinical judgement   ADL Overall ADL's : Needs assistance/impaired Eating/Feeding: Set up;Bed level   Grooming: Wash/dry face;Moderate assistance;Sitting Grooming Details (indicate cue type and reason): mod-max A to maintain sitting balance. Upper  Body Bathing: Maximal assistance;Sitting Upper Body Bathing Details (indicate cue type and reason): for sitting balance Lower Body Bathing: Maximal assistance;Sitting/lateral leans   Upper Body Dressing : Maximal assistance;Sitting   Lower  Body Dressing: Maximal assistance;Sitting/lateral leans     Toilet Transfer Details (indicate cue type and reason): deferred         Functional mobility during ADLs: Maximal assistance;+2 for safety/equipment;+2 for physical assistance General ADL Comments: Pt limited by pain and generalized weakness     Vision Baseline Vision/History: 1 Wears glasses Vision Assessment?: No apparent visual deficits     Perception     Praxis      Pertinent Vitals/Pain Pain Assessment Pain Assessment: Faces Faces Pain Scale: Hurts even more Pain Location: B LE in dependent position Pain Descriptors / Indicators: Grimacing, Guarding, Aching Pain Intervention(s): Monitored during session, Repositioned     Hand Dominance     Extremity/Trunk Assessment Upper Extremity Assessment Upper Extremity Assessment: Generalized weakness   Lower Extremity Assessment Lower Extremity Assessment: Generalized weakness   Cervical / Trunk Assessment Cervical / Trunk Assessment: Other exceptions (large body habitus.)   Communication Communication Communication: No difficulties   Cognition Arousal/Alertness: Awake/alert Behavior During Therapy: WFL for tasks assessed/performed Overall Cognitive Status: Impaired/Different from baseline Area of Impairment: Problem solving, Safety/judgement                         Safety/Judgement: Decreased awareness of safety   Problem Solving: Slow processing General Comments: Pt requiring increased time for all communication. Difficulty problem solving and describing who can assist at home. Pt believes she is safe to go home, but unable to identify how she will bathe, toilet, etc     General Comments  VSS. Daughter present at end of session.    Exercises     Shoulder Instructions      Home Living Family/patient expects to be discharged to:: Private residence Living Arrangements: Spouse/significant other Available Help at Discharge: Family;Available  PRN/intermittently Type of Home: House Home Access: Ramped entrance     Home Layout: One level     Bathroom Shower/Tub: Tub/shower unit;Door   Bathroom Toilet: Handicapped height     Home Equipment: Grab bars - tub/shower;Rolling Environmental consultant (2 wheels);Rollator (4 wheels);BSC/3in1;Cane - quad;Shower seat   Additional Comments: husband works as Administrator during day, typically home by 6p; shower seat does not fit in her tub      Prior Functioning/Environment Prior Level of Function : Independent/Modified Independent;Driving             Mobility Comments: modI with RW. Has not ambulated x 1 week ADLs Comments: Husband has recently been assisting with all ADL as pt has not been ambilatory for one week        OT Problem List: Decreased strength;Decreased activity tolerance;Impaired balance (sitting and/or standing);Decreased range of motion;Pain;Obesity;Decreased knowledge of use of DME or AE;Decreased safety awareness;Decreased cognition      OT Treatment/Interventions: Self-care/ADL training;Therapeutic exercise;DME and/or AE instruction;Therapeutic activities;Patient/family education;Balance training    OT Goals(Current goals can be found in the care plan section) Acute Rehab OT Goals Patient Stated Goal: no pain OT Goal Formulation: With patient Time For Goal Achievement: 04/18/22 Potential to Achieve Goals: Fair  OT Frequency: Min 2X/week    Co-evaluation              AM-PAC OT "6 Clicks" Daily Activity     Outcome Measure Help from another person eating meals?: A Little Help from another  person taking care of personal grooming?: A Lot Help from another person toileting, which includes using toliet, bedpan, or urinal?: A Lot Help from another person bathing (including washing, rinsing, drying)?: A Lot Help from another person to put on and taking off regular upper body clothing?: A Lot Help from another person to put on and taking off regular lower body  clothing?: Total 6 Click Score: 12   End of Session Nurse Communication: Mobility status  Activity Tolerance: Patient limited by pain Patient left: in bed;with call bell/phone within reach;with bed alarm set;with family/visitor present  OT Visit Diagnosis: Unsteadiness on feet (R26.81);Muscle weakness (generalized) (M62.81);Other abnormalities of gait and mobility (R26.89);Pain Pain - Right/Left: Right Pain - part of body: Ankle and joints of foot (BLE)                Time: 0354-6568 OT Time Calculation (min): 22 min Charges:  OT General Charges $OT Visit: 1 Visit OT Evaluation $OT Eval Moderate Complexity: 1 Mod  Shanda Howells, OTR/L [facility] Acute Rehabilitation Office: [phone]   Lula Olszewski 04/04/2022, 5:17 PM

## 2022-04-04 NOTE — Progress Notes (Signed)
OT Cancellation Note  Patient Details Name: Susan [name] MRN: [id number] DOB: 1951-01-19   Cancelled Treatment:    Reason Eval/Treat Not Completed: Patient at procedure or test/ unavailable;Other (comment) (at HD. Will return as schedule allows)  Shanda Howells, OTR/L [facility] Acute Rehabilitation Office: [phone]   Susan [name] 04/04/2022, 10:15 AM

## 2022-04-05 DIAGNOSIS — N179 Acute kidney failure, unspecified: Secondary | ICD-10-CM | POA: Diagnosis not present

## 2022-04-05 LAB — CBC
HCT: 25.2 % — ABNORMAL LOW (ref 36.0–46.0)
Hemoglobin: 7.7 g/dL — ABNORMAL LOW (ref 12.0–15.0)
MCH: 25.3 pg — ABNORMAL LOW (ref 26.0–34.0)
MCHC: 30.6 g/dL (ref 30.0–36.0)
MCV: 82.9 fL (ref 80.0–100.0)
Platelets: 122 10*3/uL — ABNORMAL LOW (ref 150–400)
RBC: 3.04 MIL/uL — ABNORMAL LOW (ref 3.87–5.11)
RDW: 17.1 % — ABNORMAL HIGH (ref 11.5–15.5)
WBC: 9.9 10*3/uL (ref 4.0–10.5)
nRBC: 0.2 % (ref 0.0–0.2)

## 2022-04-05 LAB — RENAL FUNCTION PANEL
Albumin: 2 g/dL — ABNORMAL LOW (ref 3.5–5.0)
Anion gap: 11 (ref 5–15)
BUN: 108 mg/dL — ABNORMAL HIGH (ref 8–23)
CO2: 24 mmol/L (ref 22–32)
Calcium: 7.3 mg/dL — ABNORMAL LOW (ref 8.9–10.3)
Chloride: 96 mmol/L — ABNORMAL LOW (ref 98–111)
Creatinine, Ser: 5.03 mg/dL — ABNORMAL HIGH (ref 0.44–1.00)
GFR, Estimated: 9 mL/min — ABNORMAL LOW (ref >60)
Glucose, Bld: 224 mg/dL — ABNORMAL HIGH (ref 70–99)
Phosphorus: 5.3 mg/dL — ABNORMAL HIGH (ref 2.5–4.6)
Potassium: 3.7 mmol/L (ref 3.5–5.1)
Sodium: 131 mmol/L — ABNORMAL LOW (ref 135–145)

## 2022-04-05 LAB — GLUCOSE, CAPILLARY
Glucose-Capillary: 137 mg/dL — ABNORMAL HIGH (ref 70–99)
Glucose-Capillary: 203 mg/dL — ABNORMAL HIGH (ref 70–99)
Glucose-Capillary: 194 mg/dL — ABNORMAL HIGH (ref 70–99)

## 2022-04-05 MED ORDER — APIXABAN 5 MG PO TABS
5.0000 mg | ORAL_TABLET | Freq: Two times a day (BID) | ORAL | Status: DC
  Administered 2022-04-05 – 2022-04-07 (×5): 5 mg via ORAL
  Filled 2022-04-05 (×5): qty 1

## 2022-04-05 MED ORDER — HEPARIN SODIUM (PORCINE) 1000 UNIT/ML IJ SOLN
INTRAMUSCULAR | Status: AC
  Administered 2022-04-05: 3800 [IU]
  Filled 2022-04-05: qty 4

## 2022-04-05 MED ORDER — OXYCODONE HCL 5 MG PO TABS
5.0000 mg | ORAL_TABLET | Freq: Once | ORAL | Status: AC
  Administered 2022-04-05: 5 mg via ORAL
  Filled 2022-04-05: qty 1

## 2022-04-05 MED ORDER — INSULIN PUMP
Freq: Three times a day (TID) | SUBCUTANEOUS | Status: DC
  Administered 2022-04-05: 1.45 via SUBCUTANEOUS
  Filled 2022-04-05: qty 1

## 2022-04-05 NOTE — Plan of Care (Signed)

## 2022-04-05 NOTE — Progress Notes (Signed)
Right una boot and left leg profore dressing removed per MD, MD in to evaluate both legs, bil. Legs washed with soap and water, pad dried, left leg adaptic applied and wrapped with 4 layer profore, pt. Pre-medicated with IV dilaudid with good relief, pt. Tol. Well  Anastasio Auerbach

## 2022-04-05 NOTE — Progress Notes (Signed)
PROGRESS NOTE    Susan [name]  KGM:[id number] DOB: [birth year] DOA: 03/30/2022 PCP: Susan Frizzle, MD  Chief Complaint  Patient presents with   Wound Check    Brief Narrative:  Susan [name] is a 71 y.o. female with medical history significant of PAF on Eliquis, CKD stage IV, CAD, chronic HFpEF, morbid obesity, IIDM, chronic venous stasis wound on right leg, recent recurrent C. difficile colitis, recurrent UTIs, presented with bleeding from the right leg venous stasis ulcer.   Hospitalization also complicated by worsening renal function with uremia.  Renal following, planning for trial of lasix and likely will need dialysis.  Vascular has been consulted for access + TDC.   Assessment & Plan:   Principal Problem:   AKI (acute kidney injury) ([place]) Active Problems:   Venous stasis ulcer of right calf (HCC)   Assessment and Plan: Acute Kidney Injury on CKD IV or V  Uremia Baseline creatinine appears to be ~3-5 over the past year or so, recently worsening with creatinine 7.09 + asterixis, mild confusion from PTA is now resolved diuretic challenge with 120 mg lasix q8 Unfortunately, she did not respond - initiate dialysis Seen by vascular surgery and underwent AV fistula and TDC placement on 11/8  Chronic HFrEF (EF 30%) - Echo 01/2022 TEE with EF 30%, RVSF moderately reduced (see report) - case discussed by Dr. Augustin [name] with Dr. Aundra [name] of advanced HF service who did not believe patient would benefit from inotropes and not candidate for home inotropes.  Right leg venous ulcer bleeding  Bleeding from Varicose Veins secondary to Anticoagulation therapy for Atrial Fibrillation -Eliquis being resumed cautiously on 11/11 -s/p DDAVP in setting of her uremia -appreciate orthopedic assistance, recommending UNNA boots and prafo boot for right, bleeding appears to have subsided--dressing changes as per Ortho---she can weight-bear as tolerated and patient has been told to mobilize -left leg ulcer  treated with Kerecis micro graft  -follow up with ortho next week for reapplication Kerecis tissue graft left leg   Acute Blood Loss Anemia Stable, but fluctuating Follow type and screen, repeat H/H Transfuse prn for Hemoglobin below 7   PAF, CHad2Vasc2>4 -Eliquis is resumed 11/11 monitor - Continue amiodarone 200 qd   HTN -Blood pressure borderline low, hold off home BP meds, start as needed hydralazine -low threshold for transfusion   Recurrent C. difficile colitis -Continue for Fidaxomycin -saw ID on 11/1, was prescribed fidaxomicin on 11/3 for her C difficile --will need to complete 10 days on 11/12  -She is asymptomatic and not having any diarrhea now will just complete therapies   Concern for Colovesical Fistula - discussed with urology,--requires outpatient cystogram-- will defer for now until renal function/volume status have stabilized  IDDM hyperglycemia -has insulin pump, protocol for patient administration ordered  - pump discontinued 11/7 -started on basal/bolus insulin--Sugars 217-224, on LA 22 U as well as mealtime coverage --started Tradjenta 5 this admit -Transition back to pump on 11/11 and discontinue sliding scale coverage-if unable to use need to confirm that she is able to get insulin in the outpatient setting for safe discharge   History of recurrent UTI -On chronic suppression antibiotics, but we will hold off MicroBid for now   Gout -Hold off colchicine due to AKI  Depression -continues on Trintellix 20 as per Prior meds    DVT prophylaxis: heparin Code Status: full Family Communication: called 11/11 to d/w husband Susan [name] [phone] but no answer Disposition:   Status is: Inpatient Remains inpatient appropriate because: pending further improvement  as well as planning in terms of clip for discharge   Consultants:  Renal Ortho Vascular surgery  Procedures:  11/8 Left arm AV fistula  11/8 TDC placement  Antimicrobials:   Anti-infectives (From admission, onward)    Start     Dose/Rate Route Frequency Ordered Stop   04/02/22 0000  vancomycin (VANCOCIN) IVPB 1000 mg/200 mL premix        1,000 mg 200 mL/hr over 60 Minutes Intravenous To Surgery 04/01/22 1513 04/02/22 1057   03/30/22 2200  fidaxomicin (DIFICID) tablet 200 mg        200 mg Oral 2 times daily 03/30/22 1759 04/06/22 2159       Subjective:  Seen on HD unit-comfortable no distress no diarrhea no chills No nausea no vomiting No fever   Objective: Vitals:   04/05/22 0740 04/05/22 0746 04/05/22 0800 04/05/22 0829  BP: (!) 125/49 (!) 125/49 (!) 136/52 136/71  Pulse: 75 74 74 76  Resp: '10 13 10 12  '$ Temp: 98.2 F (36.8 C)     TempSrc:      SpO2: 95%  96%   Weight: 125.2 kg     Height:        Intake/Output Summary (Last 24 hours) at 04/05/2022 0850 Last data filed at 04/05/2022 0600 Gross per 24 hour  Intake 120 ml  Output 2300 ml  Net -2180 ml    Filed Weights   04/04/22 0800 04/04/22 1114 04/05/22 0740  Weight: 126.3 kg 124.4 kg 125.2 kg    Examination:  EOMI NCAT thick neck  Coherent no distress S1-S2 no murmur TDC in place in right chest Abdomen obese nontender no rebound Both lower extremities are in Unna boots I did not examine today ROM is intact    Data Reviewed: I have personally reviewed following labs and imaging studies  CBC: Recent Labs  Lab 03/30/22 1018 03/30/22 1720 04/01/22 0756 04/01/22 1505 04/02/22 0703 04/03/22 0528 04/04/22 0349 04/05/22 0122  WBC 8.4   < > 9.7  --  10.2 7.3 8.9 9.9  NEUTROABS 6.9  --  7.7  --  8.3*  --   --   --   HGB 8.7*   < > 7.3* 7.9* 7.8* 7.9* 8.1* 7.7*  HCT 28.0*   < > 23.6* 25.2* 25.0* 25.1* 25.8* 25.2*  MCV 84.3   < > 82.2  --  82.5 82.0 81.1 82.9  PLT 116*   < > 103*  --  107* 107* 121* 122*   < > = values in this interval not displayed.     Basic Metabolic Panel: Recent Labs  Lab 04/01/22 0756 04/02/22 0703 04/03/22 0528 04/04/22 0349  04/05/22 0122  NA 138 136 133* 133* 131*  K 3.5 3.5 3.7 3.8 3.7  CL 103 101 95* 98 96*  CO2 19* 18* 18* 20* 24  GLUCOSE 190* 133* 354* 310* 224*  BUN 183* 183* 143* 152* 108*  CREATININE 7.14* 7.42* 5.89* 6.45* 5.03*  CALCIUM 6.9* 7.1* 7.3* 7.6* 7.3*  MG 2.2 2.2  --   --   --   PHOS 7.9* 8.7* 8.1* 8.1* 5.3*     GFR: Estimated Creatinine Clearance: 13.5 mL/min (A) (by C-G formula based on SCr of 5.03 mg/dL (H)).  Liver Function Tests: Recent Labs  Lab 04/01/22 0756 04/02/22 0703 04/03/22 0528 04/04/22 0349 04/05/22 0122  AST 14* 15  --   --   --   ALT 15 17  --   --   --  ALKPHOS 92 96  --   --   --   BILITOT 0.6 0.7  --   --   --   PROT 5.0* 4.9*  --   --   --   ALBUMIN 2.1* 2.1* 2.0* 2.0* 2.0*     CBG: Recent Labs  Lab 04/04/22 0317 04/04/22 0644 04/04/22 1140 04/04/22 1554 04/04/22 2048  GLUCAP 296* 229* 170* 185* 217*     No results found for this or any previous visit (from the past 240 hour(s)).   Radiology Studies: No results found.  Scheduled Meds:  (feeding supplement) PROSource Plus  30 mL Oral Daily   amiodarone  200 mg Oral Daily   aspirin EC  81 mg Oral Q breakfast   atorvastatin  80 mg Oral Daily   Chlorhexidine Gluconate Cloth  6 each Topical Q0600   darbepoetin (ARANESP) injection - DIALYSIS  100 mcg Subcutaneous Q Thu-1800   fidaxomicin  200 mg Oral BID   heparin  5,000 Units Subcutaneous Q12H   insulin aspart  0-15 Units Subcutaneous TID WC   insulin aspart  3 Units Subcutaneous TID WC   insulin glargine-yfgn  22 Units Subcutaneous QHS   levothyroxine  50 mcg Oral Q0600   linagliptin  5 mg Oral Daily   multivitamin  1 tablet Oral QHS   pregabalin  75 mg Oral Daily   sevelamer carbonate  1,600 mg Oral TID WC   ursodiol  300 mg Oral Daily   vortioxetine HBr  20 mg Oral Daily   Continuous Infusions:     LOS: 6 days   Time spent: 20 min  Nita Sells, MD [facility]   To contact the attending provider  between 7A-7P or the covering provider during after hours 7P-7A, please log into the web site [email or web address] and access using universal [place] password for that web site. If you do not have the password, please call the hospital operator.  04/05/2022, 8:50 AM

## 2022-04-05 NOTE — Plan of Care (Signed)
  Problem: Education: Goal: Ability to describe self-care measures that may prevent or decrease complications (Diabetes Survival Skills Education) will improve Outcome: Progressing Goal: Individualized Educational Video(s) Outcome: Progressing   Problem: Coping: Goal: Ability to adjust to condition or change in health will improve Outcome: Progressing   Problem: Fluid Volume: Goal: Ability to maintain a balanced intake and output will improve Outcome: Progressing   Problem: Health Behavior/Discharge Planning: Goal: Ability to identify and utilize available resources and services will improve Outcome: Progressing Goal: Ability to manage health-related needs will improve Outcome: Progressing   Problem: Metabolic: Goal: Ability to maintain appropriate glucose levels will improve Outcome: Progressing   Problem: Nutritional: Goal: Maintenance of adequate nutrition will improve Outcome: Progressing Goal: Progress toward achieving an optimal weight will improve Outcome: Progressing   Problem: Skin Integrity: Goal: Risk for impaired skin integrity will decrease Outcome: Progressing   Problem: Tissue Perfusion: Goal: Adequacy of tissue perfusion will improve Outcome: Progressing   Problem: Health Behavior/Discharge Planning: Goal: Ability to manage health-related needs will improve Outcome: Progressing   Problem: Clinical Measurements: Goal: Ability to maintain clinical measurements within normal limits will improve Outcome: Progressing Goal: Will remain free from infection Outcome: Progressing Goal: Diagnostic test results will improve Outcome: Progressing Goal: Respiratory complications will improve Outcome: Progressing Goal: Cardiovascular complication will be avoided Outcome: Progressing   Problem: Activity: Goal: Risk for activity intolerance will decrease Outcome: Progressing   Problem: Nutrition: Goal: Adequate nutrition will be maintained Outcome:  Progressing   Problem: Coping: Goal: Level of anxiety will decrease Outcome: Progressing   Problem: Elimination: Goal: Will not experience complications related to bowel motility Outcome: Progressing Goal: Will not experience complications related to urinary retention Outcome: Progressing   Problem: Pain Managment: Goal: General experience of comfort will improve Outcome: Progressing   Problem: Safety: Goal: Ability to remain free from injury will improve Outcome: Progressing   Problem: Skin Integrity: Goal: Risk for impaired skin integrity will decrease Outcome: Progressing   

## 2022-04-05 NOTE — Progress Notes (Signed)
[facility] Progress Note   71 y.o. 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 Hollie Salk with [facility].  She has issues with fluid and salt restriction in the setting of CHF also followed by Dr. Haroldine Laws. Torsemide dose is quite high at '80mg'$  BID and she's not responding well recently. She has had positive urine culture with Enterobacter aerogenes S to quinolones, gentamycin, tobramycin, and bactrim.  Here with bleeding from a wound on the right found noted to be tearing of the skin.  She had UNNA boots but the right one was removed in the ED because of the wound.     Assessment/ Plan:   H/o advanced CKD IV/V with a baseline creatinine in the 3.5-4.2 range in 01/2022 which has now progressed to ESRD. Renal function has been steadily worsening and in the 4.8-5.1 range in 02/2022. Again appears to have progressive CKD to ESRD; I d/w the advanced HF team and confirmed that there is no maneuvers or treatments possible from the HF end. Fortunately no absolute indications for dialysis at this time but certainly possible she may need it this hospitalization. She was tearful when I explained this but expressed understanding. - Not a good response to Lasix '120mg'$  -> stopped the Metolazone and Lasix. - Appreciate VVS Dr. Trula Slade placing RIJ TC and 1st stage BBF on 11/8. Patient rx HD #1 very late on 11/8 and didn't complete the treatment till after MN. HD #2 11/10  Seen on 3rd treatment HD today 4K bath 2L goal net 147/56 RIJ TC  Plan on TTS next week; I tried to convince her to do an extra treatment Mon mainly for UF (still has significant edema in LE - has UNNA boots on now) but really didn't want to go an extra treatment especially after 3 straight  days on dialysis.  - Currently in process of CLIP as well; she doesn't want to go to a SNF even tho her spouse works. CSW to work on home health as she will need some assistance.   - 2gm Na diet and fluid restrict to 1L per day -Monitor Daily I/Os, Daily weight  -Maintain MAP>65 for optimal renal perfusion.  - Avoid nephrotoxic agents such as IV contrast, NSAIDs, and phosphate containing bowel preps (FLEETS)   HFrEF with EF 30% in the setting of severe TR more refractory to diuretics at this time. Renal osteodystrophy - will check a iPTH, phos 7.9 -> started Renvela 2 TIDM but not eating much w/ the nausea. Will need education of higher phos foods to avoid Anemia - 7% sats (needs iron, load with Ferrlecit '250mg'$  x2 given she received 2 units on 11/6). She rx #2/2 of the Ferrlecit. 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 Severe TR DM  PAF on Eliquis  Subjective:   Nausea last night but still much better than the nausea day before HD initiation.  Denies SOB.   Objective:   BP (!) 140/54 (BP Location: Right Arm)   Pulse 76   Temp 98.2 F (36.8 C)   Resp 13   Ht '5\' 5"'$  (1.651 m)   Wt 125.2 kg   SpO2 99%   BMI 45.93 kg/m   Intake/Output Summary (Last 24 hours) at 04/05/2022 [phone]  Last data filed at 04/05/2022 0600 Gross per 24 hour  Intake 120 ml  Output 2300 ml  Net -2180 ml   Weight change:   Physical Exam: GEN: NAD, supine in bed on hd HEENT: EOMI NECK: supple, JVD present RESP:clear bilaterally no c/w/r CV: RRR, I/V systolic murmur today no rub or gallop appreciated ABD: soft, obese, NTND, +BS, no guarding or rebound tenderness LE: 1+ LE edema with a superficial skin tear in the right foot NEURO: no asterixis ACCESS: RIJ TC, good thrill in left BBF  Imaging: No results found.  Labs: BMET Recent Labs  Lab 03/30/22 1018 03/31/22 0414 04/01/22 0756 04/02/22 0703 04/03/22 0528 04/04/22 0349 04/05/22 0122  NA 136 139 138 136 133* 133* 131*  K 3.4* 3.4* 3.5  3.5 3.7 3.8 3.7  CL 97* 101 103 101 95* 98 96*  CO2 21* 19* 19* 18* 18* 20* 24  GLUCOSE 261* 275* 190* 133* 354* 310* 224*  BUN 177* 180* 183* 183* 143* 152* 108*  CREATININE 7.14* 7.09* 7.14* 7.42* 5.89* 6.45* 5.03*  CALCIUM 6.9* 6.9* 6.9* 7.1* 7.3* 7.6* 7.3*  PHOS  --   --  7.9* 8.7* 8.1* 8.1* 5.3*   CBC Recent Labs  Lab 03/30/22 1018 03/30/22 1720 04/01/22 0756 04/01/22 1505 04/02/22 0703 04/03/22 0528 04/04/22 0349 04/05/22 0122  WBC 8.4   < > 9.7  --  10.2 7.3 8.9 9.9  NEUTROABS 6.9  --  7.7  --  8.3*  --   --   --   HGB 8.7*   < > 7.3*   < > 7.8* 7.9* 8.1* 7.7*  HCT 28.0*   < > 23.6*   < > 25.0* 25.1* 25.8* 25.2*  MCV 84.3   < > 82.2  --  82.5 82.0 81.1 82.9  PLT 116*   < > 103*  --  107* 107* 121* 122*   < > = values in this interval not displayed.    Medications:     (feeding supplement) PROSource Plus  30 mL Oral Daily   amiodarone  200 mg Oral Daily   aspirin EC  81 mg Oral Q breakfast   atorvastatin  80 mg Oral Daily   Chlorhexidine Gluconate Cloth  6 each Topical Q0600   darbepoetin (ARANESP) injection - DIALYSIS  100 mcg Subcutaneous Q Thu-1800   fidaxomicin  200 mg Oral BID   heparin  5,000 Units Subcutaneous Q12H   insulin aspart  0-15 Units Subcutaneous TID WC   insulin aspart  3 Units Subcutaneous TID WC   insulin glargine-yfgn  22 Units Subcutaneous QHS   insulin pump   Subcutaneous TID WC, HS, 0200   levothyroxine  50 mcg Oral Q0600   linagliptin  5 mg Oral Daily   multivitamin  1 tablet Oral QHS   pregabalin  75 mg Oral Daily   sevelamer carbonate  1,600 mg Oral TID WC   ursodiol  300 mg Oral Daily   vortioxetine HBr  20 mg Oral Daily      [name], MD 04/05/2022, 9:47 AM

## 2022-04-06 DIAGNOSIS — N179 Acute kidney failure, unspecified: Secondary | ICD-10-CM | POA: Diagnosis not present

## 2022-04-06 LAB — CBC
HCT: 25.8 % — ABNORMAL LOW (ref 36.0–46.0)
Hemoglobin: 7.8 g/dL — ABNORMAL LOW (ref 12.0–15.0)
MCH: 24.9 pg — ABNORMAL LOW (ref 26.0–34.0)
MCHC: 30.2 g/dL (ref 30.0–36.0)
MCV: 82.4 fL (ref 80.0–100.0)
Platelets: 129 10*3/uL — ABNORMAL LOW (ref 150–400)
RBC: 3.13 MIL/uL — ABNORMAL LOW (ref 3.87–5.11)
RDW: 17.4 % — ABNORMAL HIGH (ref 11.5–15.5)
WBC: 9.8 10*3/uL (ref 4.0–10.5)
nRBC: 0 % (ref 0.0–0.2)

## 2022-04-06 LAB — GLUCOSE, CAPILLARY
Glucose-Capillary: 139 mg/dL — ABNORMAL HIGH (ref 70–99)
Glucose-Capillary: 69 mg/dL — ABNORMAL LOW (ref 70–99)
Glucose-Capillary: 83 mg/dL (ref 70–99)
Glucose-Capillary: 100 mg/dL — ABNORMAL HIGH (ref 70–99)
Glucose-Capillary: 124 mg/dL — ABNORMAL HIGH (ref 70–99)
Glucose-Capillary: 120 mg/dL — ABNORMAL HIGH (ref 70–99)

## 2022-04-06 LAB — RENAL FUNCTION PANEL
Albumin: 1.9 g/dL — ABNORMAL LOW (ref 3.5–5.0)
Anion gap: 10 (ref 5–15)
BUN: 66 mg/dL — ABNORMAL HIGH (ref 8–23)
CO2: 28 mmol/L (ref 22–32)
Calcium: 7.5 mg/dL — ABNORMAL LOW (ref 8.9–10.3)
Chloride: 100 mmol/L (ref 98–111)
Creatinine, Ser: 3.92 mg/dL — ABNORMAL HIGH (ref 0.44–1.00)
GFR, Estimated: 12 mL/min — ABNORMAL LOW (ref >60)
Glucose, Bld: 128 mg/dL — ABNORMAL HIGH (ref 70–99)
Phosphorus: 3.7 mg/dL (ref 2.5–4.6)
Potassium: 4.1 mmol/L (ref 3.5–5.1)
Sodium: 138 mmol/L (ref 135–145)

## 2022-04-06 MED ORDER — VORTIOXETINE HBR 10 MG PO TABS: 20.0000 mg | ORAL_TABLET | Freq: Every day | ORAL | 0 refills | Status: DC

## 2022-04-06 MED ORDER — INSULIN PUMP: 1.0000 | Freq: Three times a day (TID) | SUBCUTANEOUS | Status: DC

## 2022-04-06 MED ORDER — PREGABALIN 75 MG PO CAPS: 75.0000 mg | ORAL_CAPSULE | Freq: Every day | ORAL | 0 refills | Status: DC

## 2022-04-06 MED ORDER — LINAGLIPTIN 5 MG PO TABS: 5.0000 mg | ORAL_TABLET | Freq: Every day | ORAL | 0 refills | Status: DC

## 2022-04-06 MED ORDER — SEVELAMER CARBONATE 800 MG PO TABS: 1600.0000 mg | ORAL_TABLET | Freq: Three times a day (TID) | ORAL | 1 refills | Status: DC

## 2022-04-06 MED ORDER — HYDRALAZINE HCL 25 MG PO TABS: 25.0000 mg | ORAL_TABLET | Freq: Four times a day (QID) | ORAL | 1 refills | Status: DC | PRN

## 2022-04-06 NOTE — Progress Notes (Signed)
Mobility Specialist Progress Note:   04/06/22 1300  Mobility  Activity Transferred from bed to chair  Level of Assistance Moderate assist, patient does 50-74% (+2)  Assistive Device Stedy  Activity Response Tolerated well  Mobility Referral Yes  $Mobility charge 1 Mobility   Pt agreeable to transfer to chair. Required modA+2 to stand with RW, which bilat knee buckling present once standing. Opted for [facility], required minA+2. Tolerated well. Pt left with all needs met.   Nelta Numbers Mobility Specialist Please contact via SecureChat or  Rehab office at [phone]

## 2022-04-06 NOTE — Progress Notes (Signed)
[name] Progress Note   72 y.o. 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 Hollie Salk with [facility].  She has issues with fluid and salt restriction in the setting of CHF also followed by Dr. Haroldine Laws. Torsemide dose is quite high at '80mg'$  BID and she's not responding well recently. She has had positive urine culture with Enterobacter aerogenes S to quinolones, gentamycin, tobramycin, and bactrim.  Here with bleeding from a wound on the right found noted to be tearing of the skin.  She had UNNA boots but the right one was removed in the ED because of the wound.     Assessment/ Plan:   H/o advanced CKD IV/V with a baseline creatinine in the 3.5-4.2 range in 01/2022 which has now progressed to ESRD. Renal function has been steadily worsening and in the 4.8-5.1 range in 02/2022. Again appears to have progressive CKD to ESRD; I d/w the advanced HF team and confirmed that there is no maneuvers or treatments possible from the HF end. Fortunately no absolute indications for dialysis at this time but certainly possible she may need it this hospitalization. She was tearful when I explained this but expressed understanding. - Not a good response to Lasix '120mg'$  -> stopped the Metolazone and Lasix. - Appreciate VVS Dr. Trula Slade placing RIJ TC and 1st stage BBF on 11/8. Patient rx HD #1 very late on 11/8 and didn't complete the treatment till after MN. HD #2 11/10. HD #3 11/11 2L UF   Had been planning on TTS next week given she's accepted at [facility] with Dr. Marval Regal 1030 chair time and would have been able to start Tuesday arriving at 10am to complete paperwork. But she is so weak that we need to confirm she will tolerate HD in a chair. We  will do this Monday AM 1st shift and then she can start outpt on Thur.  Really needs assessment by PT/OT especially as she's going home and declining SNF. Her spouse works all day long.  CSW to work on home health as she will definitely need some assistance.   - 2gm Na diet and fluid restrict to 1L per day -Monitor Daily I/Os, Daily weight  -Maintain MAP>65 for optimal renal perfusion.  - Avoid nephrotoxic agents such as IV contrast, NSAIDs, and phosphate containing bowel preps (FLEETS)   HFrEF with EF 30% in the setting of severe TR more refractory to diuretics at this time. Renal osteodystrophy - will check a iPTH w/ HD tomorrow, phos 7.9 -> started Renvela 2 TIDM (now 3.7) . Will need education of higher phos foods to avoid Anemia - 7% sats (needs iron, load with Ferrlecit '250mg'$  x2 given she received 2 units on 11/6). She rx #2/2 of the Ferrlecit. 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 Severe TR DM  PAF on Eliquis  Subjective:   Nausea much better and eating breakfast this AM.  Has not been seen by PT; denies f/c/n/SOB.   Objective:   BP (!) 115/58 (BP Location: Right Arm)   Pulse 75   Temp 99.4 F (37.4 C) (Oral)   Resp 20   Ht '5\' 5"'$  (1.651 m)   Wt 122.6 kg   SpO2 96%   BMI 44.98 kg/m  Intake/Output Summary (Last 24 hours) at 04/06/2022 0741 Last data filed at 04/06/2022 0610 Gross per 24 hour  Intake 480 ml  Output 2150 ml  Net -1670 ml   Weight change: -1.1 kg  Physical Exam: GEN: NAD, supine in bed on hd at 60deg angle HEENT: EOMI NECK: supple, JVD present RESP:clear bilaterally no c/w/r CV: RRR, I/V systolic murmur today no rub or gallop appreciated ABD: soft, obese, NTND, +BS, no guarding or rebound tenderness LE: 1+ LE edema with a superficial skin tear in the right foot NEURO: no asterixis ACCESS: RIJ TC, good thrill in left BBF  Imaging: No results found.  Labs: BMET Recent Labs  Lab 03/31/22 0414 04/01/22 0756 04/02/22 0703 04/03/22 0528  04/04/22 0349 04/05/22 0122 04/06/22 0244  NA 139 138 136 133* 133* 131* 138  K 3.4* 3.5 3.5 3.7 3.8 3.7 4.1  CL 101 103 101 95* 98 96* 100  CO2 19* 19* 18* 18* 20* 24 28  GLUCOSE 275* 190* 133* 354* 310* 224* 128*  BUN 180* 183* 183* 143* 152* 108* 66*  CREATININE 7.09* 7.14* 7.42* 5.89* 6.45* 5.03* 3.92*  CALCIUM 6.9* 6.9* 7.1* 7.3* 7.6* 7.3* 7.5*  PHOS  --  7.9* 8.7* 8.1* 8.1* 5.3* 3.7   CBC Recent Labs  Lab 03/30/22 1018 03/30/22 1720 04/01/22 0756 04/01/22 1505 04/02/22 0703 04/03/22 0528 04/04/22 0349 04/05/22 0122 04/06/22 0244  WBC 8.4   < > 9.7  --  10.2 7.3 8.9 9.9 9.8  NEUTROABS 6.9  --  7.7  --  8.3*  --   --   --   --   HGB 8.7*   < > 7.3*   < > 7.8* 7.9* 8.1* 7.7* 7.8*  HCT 28.0*   < > 23.6*   < > 25.0* 25.1* 25.8* 25.2* 25.8*  MCV 84.3   < > 82.2  --  82.5 82.0 81.1 82.9 82.4  PLT 116*   < > 103*  --  107* 107* 121* 122* 129*   < > = values in this interval not displayed.    Medications:     (feeding supplement) PROSource Plus  30 mL Oral Daily   amiodarone  200 mg Oral Daily   apixaban  5 mg Oral BID   aspirin EC  81 mg Oral Q breakfast   atorvastatin  80 mg Oral Daily   Chlorhexidine Gluconate Cloth  6 each Topical Q0600   darbepoetin (ARANESP) injection - DIALYSIS  100 mcg Subcutaneous Q Thu-1800   fidaxomicin  200 mg Oral BID   insulin aspart  0-15 Units Subcutaneous TID WC   insulin aspart  3 Units Subcutaneous TID WC   insulin glargine-yfgn  22 Units Subcutaneous QHS   insulin pump   Subcutaneous TID WC, HS, 0200   levothyroxine  50 mcg Oral Q0600   linagliptin  5 mg Oral Daily   multivitamin  1 tablet Oral QHS   pregabalin  75 mg Oral Daily   sevelamer carbonate  1,600 mg Oral TID WC   ursodiol  300 mg Oral Daily   vortioxetine HBr  20 mg Oral Daily      [name], MD 04/06/2022, 7:41 AM

## 2022-04-06 NOTE — Progress Notes (Signed)
Blood glucose increasing to 83, pt. Drank 4 oz of orange juice and ate 75% of her breakfast, pt. Refusing 3 units Novolog with meals, states she only covers it when her glucose is at least 100, will cont. To monitor  Anastasio Auerbach

## 2022-04-06 NOTE — Progress Notes (Signed)
    Durable Medical Equipment  (From admission, onward)           Start     Ordered   04/06/22 1002  DME standard manual wheelchair with seat cushion  Once       Comments: Patient suffers from lower extremity wound and is in [name] which impairs their ability to perform daily activities like dressing, feeding, and grooming in the home.  A cane, crutch, or walker will not resolve issue with performing activities of daily living. A wheelchair will allow patient to safely perform daily activities. Patient can safely propel the wheelchair in the home or has a caregiver who can provide assistance. Length of need Lifetime. Accessories: elevating leg rests (ELRs), wheel locks, extensions and anti-tippers.   04/06/22 1004

## 2022-04-06 NOTE — Progress Notes (Signed)
Blood glucose 69, pt. Asymptomatic, 4 oz of orange juice given, will cont. To monitor

## 2022-04-06 NOTE — Progress Notes (Signed)
Patient is a maximum two person assist in all activities of daily living.  We talked in length about how weak she is.  She acknowledges that she is worried about going home and her husband being able to take care of her.  She is now interested in considering rehab to build her strength.  Also, she is having increasing jerking in her upper extremities.  This makes it very difficult to enter data into her insulin pump.  I had to operate the insulin pump for her.  Will continue to monitor patient.  Earleen Reaper RN

## 2022-04-06 NOTE — Discharge Summary (Signed)
Physician Discharge Summary  Susan [name] YDX:[id number] DOB: 04/02/1951 DOA: 03/30/2022  PCP: Susan Frizzle, MD  Admit date: 03/30/2022 Discharge date: 04/06/2022  Time spent: 36 minutes  Recommendations for Outpatient Follow-up:  Needs Chem-12, CBC 1 week Recommend Unna boots to stay on until clinic visit with Dr. Sharol Given in 1 week Various medications including multi antihypertensives and diuretics held-see MAR New medications of Sensipar and others added Wheelchair home health physical therapy ordered for home Recommend referral to urology for voiding cystogram in the outpatient setting  Discharge Diagnoses:  MAIN problem for hospitalization   Acute superimposed on chronic kidney disease stage IV started HD this admission and is clipped to [place] 10:30 AM chair time Right lower extremity ulcer with bleeding status post micro grafting by Dr. 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 female with medical history significant of PAF on Eliquis, CKD stage IV, CAD, chronic HFpEF, morbid obesity, IIDM, chronic venous stasis wound on right leg, recent recurrent C. difficile colitis, recurrent UTIs, presented with bleeding from the right leg venous stasis ulcer.   Hospitalization also complicated by worsening renal function with uremia.  Renal following, planning for trial of lasix and likely will need dialysis.  Vascular has been consulted for access + TDC.   Hospital Course:  Acute Kidney Injury  on CKD IV or V  Uremia Baseline creatinine appears to be ~3-5 over the past year or so, recently worsening with creatinine 7.09 + asterixis, mild confusion from PTA is now resolved diuretic challenge with 120 mg lasix q8 Unfortunately, she did not respond - initiate dialysis this admission and has been clipped to [facility] [place] TTS vascular surgery and underwent AV fistula and TDC placement on 11/8   Chronic HFrEF (EF 30%) - Echo 01/2022 TEE with EF 30%, RVSF moderately reduced (see report) - case discussed by Dr. Augustin Coupe with Dr. Aundra Dubin of advanced HF service who did not believe patient would benefit from inotropes and not candidate for home inotropes.   Right leg venous ulcer bleeding  Bleeding from Varicose Veins secondary to Anticoagulation therapy for Atrial Fibrillation -Eliquis being resumed cautiously on 11/11 -s/p DDAVP in setting of her uremia -appreciate orthopedic assistance, recommending UNNA boots and prafo boot for right, bleeding appears to have subsided--dressing changes as per Ortho---she can weight-bear as tolerated and patient has been told to mobilize -left leg ulcer treated with Kerecis micro graft  -follow up with ortho Dr. Sharol Given will CC him to ensure not lost to follow-up   Acute Blood Loss Anemia Stable, but fluctuating Follow type and screen, repeat H/H Transfuse prn for Hemoglobin below 7 Will need iron studies in the outpatient setting   PAF, CHad2Vasc2>4 -Eliquis is resumed 11/11 and no further bleeding noted - Continue amiodarone 200 qd   HTN -Blood pressure borderline low, hold off home BP meds, start as needed hydralazine only for blood pressures above 150  Recurrent C. difficile colitis -Continue for Fidaxomycin -saw ID on 11/1, was prescribed fidaxomicin on 11/3 for her  C difficile --completed therapy on 11/12 -She is asymptomatic and not having any diarrhea now will just complete therapies   Concern for Colovesical Fistula - discussed with  urology,--requires outpatient cystogram-- will defer for now until renal function/volume status have stabilized   IDDM hyperglycemia and hypoglycemia -has insulin pump, protocol for patient administration ordered  - pump discontinued 11/7 -started on basal/bolus insulin--Sugars ranging predominantly 200 range but had low of 65-we will need adjustment with insulin pump --started Tradjenta 5 this admit -Transition back to pump on 11/11 and careful coordination in the outpatient setting   History of recurrent UTI -On chronic suppression antibiotics, but we will hold off MacroBid for now   Gout -Hold off colchicine due to AKI   Depression -continues on Trintellix 20 as per Prior meds   Discharge Exam: Vitals:   04/06/22 0528 04/06/22 0849  BP: (!) 115/58 (!) 121/48  Pulse: 75   Resp: 20 18  Temp: 99.4 F (37.4 C) 99.1 F (37.3 C)  SpO2: 96% 94%    Subj on day of d/c   Awake coherent no distress EOMI NCAT Declines going to skilled We had a long discussion about this after her prior discussion with nephrology and nursing I confirm with her husband that discharge plan is home and he is willing to take time to be able to take care of things with her  General Exam on discharge  EOMI NCAT no focal deficit no icterus no pallor Neck soft supple thick ROM intact no focal deficit to power but weak secondary to pain Wounds as below from exam and taking off dressings 11/11     Discharge Instructions   Discharge Instructions     Call MD for:  difficulty breathing, headache or visual disturbances   Complete by: As directed    Call MD for:  severe uncontrolled pain   Complete by: As directed    Call MD for:  temperature >100.4   Complete by: As directed    Diet - low sodium heart healthy   Complete by: As directed    Discharge instructions   Complete by: As directed    Please make sure that you are monitored at home and ensure that you have someone to help you with your  insulin pump if there are issues Would recommend that you mobilize carefully and slowly out of bed with a wheelchair that we will order for you we will also ask home health to come out and assist you at home If there are issues with bleeding/pain please follow-up with Dr. Sharol Given earlier in the next week otherwise please follow-up with him in 10 days from day of discharge You will notice that several medications have changed on your medication list and that that we have adjusted various other medications--- please ensure that you look at your list carefully and do not alter the meds until you are seen by someone in healthcare in the next several weeks the nephrologist may be the one to adjust meds   Increase activity slowly   Complete by: As directed    Leave dressing on - Keep it clean, dry, and intact until clinic visit   Complete by: As directed       Allergies as of 04/06/2022       Reactions   Keflex [cephalexin] Diarrhea   Codeine Nausea And Vomiting, Other (See Comments)        Medication List     STOP taking these medications    atorvastatin  80 MG tablet Commonly known as: LIPITOR   carvedilol 6.25 MG tablet Commonly known as: COREG   isosorbide mononitrate 30 MG 24 hr tablet Commonly known as: IMDUR   metolazone 2.5 MG tablet Commonly known as: ZAROXOLYN   nitroGLYCERIN 0.4 MG SL tablet Commonly known as: NITROSTAT   OneTouch Verio test strip Generic drug: glucose blood   torsemide 20 MG tablet Commonly known as: DEMADEX       TAKE these medications    albuterol 108 (90 Base) MCG/ACT inhaler Commonly known as: VENTOLIN HFA Inhale 1-2 puffs into the lungs every 6 (six) hours as needed for wheezing or shortness of breath.   amiodarone 200 MG tablet Commonly known as: PACERONE Take 1 tablet (200 mg total) by mouth daily.   apixaban 5 MG Tabs tablet Commonly known as: ELIQUIS Take 1 tablet (5 mg total) by mouth 2 (two) times daily.   aspirin EC 81 MG  tablet Take 1 tablet (81 mg total) by mouth daily with breakfast.   colchicine 0.6 MG tablet Take 0.6 mg by mouth daily as needed (gout).   diphenoxylate-atropine 2.5-0.025 MG tablet Commonly known as: Lomotil Take 1 tablet by mouth 4 (four) times daily as needed for diarrhea or loose stools.   ferrous sulfate 325 (65 FE) MG tablet Take 325 mg by mouth 2 (two) times daily.   fidaxomicin 200 MG Tabs tablet Commonly known as: DIFICID Take 1 tablet (200 mg total) by mouth 2 (two) times daily.   FreeStyle Libre 2 Sensor Misc   hydrALAZINE 25 MG tablet Commonly known as: APRESOLINE Take 1 tablet (25 mg total) by mouth every 6 (six) hours as needed (SBP>150 or DBP>100).   insulin pump Soln Inject 1 each into the skin 3 times daily with meals, bedtime and 2 AM.   levothyroxine 50 MCG tablet Commonly known as: SYNTHROID Take 1 tablet (50 mcg total) by mouth daily before breakfast.   linagliptin 5 MG Tabs tablet Commonly known as: TRADJENTA Take 1 tablet (5 mg total) by mouth daily. Start taking on: April 07, 2022   magnesium oxide 400 (240 Mg) MG tablet Commonly known as: MAG-OX Take 1 tablet (400 mg total) by mouth daily.   nitrofurantoin (macrocrystal-monohydrate) 100 MG capsule Commonly known as: Macrobid Take 1 capsule (100 mg total) by mouth 2 (two) times daily.   OneTouch Delica Plus [id number] Misc Use as directed to check blood sugars as need up to 4 times daily   OneTouch Verio Flex System w/Device Kit Use up to four times daily as directed.   OXYGEN Inhale 2 L/min into the lungs at bedtime.   Pentips 32G X 4 MM Misc Generic drug: Insulin Pen Needle Use as directed   polyethylene glycol 17 g packet Commonly known as: MIRALAX / GLYCOLAX Take 17 g by mouth as needed for mild constipation.   pregabalin 75 MG capsule Commonly known as: LYRICA Take 1 capsule (75 mg total) by mouth daily. Start taking on: April 07, 2022 What changed:  medication  strength how much to take when to take this   senna-docusate 8.6-50 MG tablet Commonly known as: Senokot-S Take 1 tablet by mouth 2 (two) times daily. What changed:  when to take this reasons to take this   sevelamer carbonate 800 MG tablet Commonly known as: RENVELA Take 2 tablets (1,600 mg total) by mouth 3 (three) times daily with meals.   ursodiol 500 MG tablet Commonly known as: ACTIGALL Take 500 mg by mouth 3 (three) times  daily.   vortioxetine HBr 10 MG Tabs tablet Commonly known as: Trintellix Take 2 tablets (20 mg total) by mouth daily.               Durable Medical Equipment  (From admission, onward)           Start     Ordered   04/06/22 1002  DME standard manual wheelchair with seat cushion  Once       Comments: Patient suffers from lower extremity wound and is in [name] which impairs their ability to perform daily activities like dressing, feeding, and grooming in the home.  A cane, crutch, or walker will not resolve issue with performing activities of daily living. A wheelchair will allow patient to safely perform daily activities. Patient can safely propel the wheelchair in the home or has a caregiver who can provide assistance. Length of need Lifetime. Accessories: elevating leg rests (ELRs), wheel locks, extensions and anti-tippers.   04/06/22 1004              Discharge Care Instructions  (From admission, onward)           Start     Ordered   04/06/22 0000  Leave dressing on - Keep it clean, dry, and intact until clinic visit        04/06/22 1004           Allergies  Allergen Reactions   Keflex [Cephalexin] Diarrhea   Codeine Nausea And Vomiting and Other (See Comments)    Follow-up Information     Newt Minion, MD Follow up in 1 week(s).   Specialty: Orthopedic Surgery Contact information: [place] [address] [phone]         [facility] Follow up in 6  week(s).   Specialty: Vascular Surgery Why: Office will call you to arrange your appt (sent). Contact information: [address] [place], [facility] Follow up.   Specialty: Home Health Services Why: Someone will call you to schedule first home visit. Contact information: [place] [address] [place]. Go on 04/08/2022.   Why: Schedule is Tuesday/Thursday/Saturday with 10:30 chair time.  On Tuesday, please arrive at 10:00 to complete paperwork prior to treatment. Contact information: [address] [place]  [address] [phone]                  The results of significant diagnostics from this hospitalization (including imaging, microbiology, ancillary and laboratory) are listed below for reference.    Significant Diagnostic Studies: DG Chest Port 1 View  Result Date: 04/02/2022 CLINICAL DATA:  Dialysis catheter insertion EXAM: PORTABLE CHEST 1 VIEW COMPARISON:  02/01/2022 FINDINGS: Right internal jugular catheter in place with the tip at the SVC RA junction. No pneumothorax. Cardiomegaly. Aortic atherosclerosis. Possible venous hypertension but without frank edema. No infiltrate, collapse or effusion. IMPRESSION: 1. Right internal jugular catheter tip at the SVC RA junction. No pneumothorax. 2. Cardiomegaly. Possible venous hypertension. Electronically Signed   By: Nelson Chimes M.D.   On: 04/02/2022 15:15   DG C-Arm 1-60 Min  Result Date: 04/02/2022 CLINICAL DATA:  ESRD.  Dialysis catheter insertion EXAM: DG C-ARM 1-60 MIN COMPARISON:  Chest XR, 02/01/2022 FLUOROSCOPY: Exposure Index (as provided by the fluoroscopic device): 2.94 MGy Kerma FINDINGS: Single, limited planar imaging of the  RIGHT chest. Image demonstrating RIGHT IJ approach tunneled dialysis catheter placement. Tip not fully imaged, though appears to be within RIGHT  atrium. IMPRESSION: Fluoroscopic imaging for RIGHT tunneled dialysis catheter placement. For complete description of intra procedural findings, please see performing service dictation. Electronically Signed   By: Michaelle Birks M.D.   On: 04/02/2022 14:24   VAS [name] UPPER EXT VEIN MAPPING (PRE-OP AVF)  Result Date: 04/01/2022 UPPER EXTREMITY VEIN MAPPING Patient Name:  Susan [name]  Date of Exam:   04/01/2022 Medical Rec #: [id number]     Accession #:    [id number] Date of Birth: 1950-09-28     Patient Gender: F Patient Age:   73 years Exam Location:  [facility] Procedure:      VAS [name] UPPER EXT VEIN MAPPING (PRE-OP AVF) Referring Phys: EMMA COLLINS --------------------------------------------------------------------------------  Indications: Pre-access. History: CRD stage IV, CAD, chronic HF pEF, morbid obesity, IIDM.  Performing Technologist: Oda Cogan RDMS, RVT  Examination Guidelines: A complete evaluation includes B-mode imaging, spectral Doppler, color Doppler, and power Doppler as needed of all accessible portions of each vessel. Bilateral testing is considered an integral part of a complete examination. Limited examinations for reoccurring indications may be performed as noted. +-----------------+-------------+----------+---------+ Right Cephalic   Diameter (cm)Depth (cm)Findings  +-----------------+-------------+----------+---------+ Shoulder             0.29        0.97             +-----------------+-------------+----------+---------+ Prox upper arm       0.28        1.70   branching +-----------------+-------------+----------+---------+ Mid upper arm        0.21        1.71             +-----------------+-------------+----------+---------+ Dist upper arm       0.21        1.24             +-----------------+-------------+----------+---------+ Antecubital fossa    0.48        0.49             +-----------------+-------------+----------+---------+ Prox forearm          0.22        0.80             +-----------------+-------------+----------+---------+ Mid forearm          0.23        0.79             +-----------------+-------------+----------+---------+ Dist forearm         0.11                         +-----------------+-------------+----------+---------+ +-----------------+-------------+----------+--------+ Right Basilic    Diameter (cm)Depth (cm)Findings +-----------------+-------------+----------+--------+ Prox upper arm       0.46                        +-----------------+-------------+----------+--------+ Mid upper arm        0.47                        +-----------------+-------------+----------+--------+ Dist upper arm       0.43                        +-----------------+-------------+----------+--------+ Antecubital fossa    0.49                        +-----------------+-------------+----------+--------+  Prox forearm         0.19                        +-----------------+-------------+----------+--------+ Mid forearm          0.25                        +-----------------+-------------+----------+--------+ Distal forearm       0.20                        +-----------------+-------------+----------+--------+ +-----------------+-------------+----------+---------+ Left Cephalic    Diameter (cm)Depth (cm)Findings  +-----------------+-------------+----------+---------+ Shoulder             0.35        1.30             +-----------------+-------------+----------+---------+ Prox upper arm       0.37        1.30             +-----------------+-------------+----------+---------+ Mid upper arm        0.31        0.87             +-----------------+-------------+----------+---------+ Dist upper arm       0.27        0.56             +-----------------+-------------+----------+---------+ Antecubital fossa    0.31               branching +-----------------+-------------+----------+---------+  Prox forearm         0.22        0.77             +-----------------+-------------+----------+---------+ Mid forearm          0.11                         +-----------------+-------------+----------+---------+ Dist forearm         0.12                         +-----------------+-------------+----------+---------+ +-----------------+-------------+----------+--------------------+ Left Basilic     Diameter (cm)Depth (cm)      Findings       +-----------------+-------------+----------+--------------------+ Prox upper arm       0.66                                    +-----------------+-------------+----------+--------------------+ Mid upper arm        0.38                                    +-----------------+-------------+----------+--------------------+ Dist upper arm       0.33                                    +-----------------+-------------+----------+--------------------+ Antecubital fossa    0.28                    branching       +-----------------+-------------+----------+--------------------+ Prox forearm         0.25                                    +-----------------+-------------+----------+--------------------+  Mid forearm          0.24               vein tapers anterior +-----------------+-------------+----------+--------------------+ Distal forearm       0.23                    branching       +-----------------+-------------+----------+--------------------+ Elbow                0.18                                    +-----------------+-------------+----------+--------------------+ *See table(s) above for measurements and observations.  Diagnosing physician: Deitra Mayo MD Electronically signed by Deitra Mayo MD on 04/01/2022 at 6:09:43 PM.    Final    DG Ankle Complete Right  Result Date: 03/30/2022 CLINICAL DATA:  Pain. EXAM: RIGHT ANKLE - COMPLETE 3+ VIEW COMPARISON:  None Available. FINDINGS: No fracture or  dislocation. Bones are diffusely demineralized. Distal foot amputation incompletely visualized. IMPRESSION: 1. No acute bony findings. 2. Distal foot amputation incompletely visualized. Electronically Signed   By: Misty Stanley M.D.   On: 03/30/2022 11:24   DG Foot Complete Right  Result Date: 03/30/2022 CLINICAL DATA:  Blister on the back of the right heel. EXAM: RIGHT FOOT COMPLETE - 3+ VIEW COMPARISON:  None Available. 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 Great toe amputation at the level of the MTP joint. Bones are diffusely demineralized lateral film shows no bony destruction of the calcaneus to suggest osteomyelitis. 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 No bony destruction in the calcaneus to suggest evidence for osteomyelitis. Electronically Signed   By: Misty Stanley M.D.   On: 03/30/2022 11:23   CT ABDOMEN PELVIS WO CONTRAST  Result Date: 03/27/2022 CLINICAL DATA:  Evaluate colovaginal or colovesical fistula. EXAM: CT ABDOMEN AND PELVIS WITHOUT CONTRAST TECHNIQUE: Multidetector CT imaging of the abdomen and pelvis was performed following the standard protocol without IV contrast. RADIATION DOSE REDUCTION: This exam was performed according to the departmental dose-optimization program which includes automated exposure control, adjustment of the mA and/or kV according to patient size and/or use of iterative reconstruction technique. COMPARISON:  CT scan 08/22/2020 FINDINGS: Lower chest: The heart is mildly enlarged. No pericardial effusion. Aortic and coronary artery calcifications are noted. Very small right pleural effusion with minimal overlying atelectasis. No pulmonary lesions. Hepatobiliary: No obvious hepatic lesions without contrast. A few tiny scattered calcified granulomas are noted. Extensive hepatic artery calcifications are noted. Numerous layering  gallstones are noted dependently in the gallbladder. No CT findings to suggest acute cholecystitis. No common bile duct dilatation. Pancreas: No mass, inflammation or ductal dilatation. Spleen: Normal size. No focal lesions. There is a small amount of perisplenic fluid. Adrenals/Urinary Tract: The adrenal glands are unremarkable. No renal lesions or renal calculi. Extensive renovascular calcifications. The bladder does contain a moderate amount of gas which is certainly suspicious for a colovesical fistula 1 is not identified for certain. There is surrounding significant colonic diverticulosis. There is also a small amount of air in the vagina but that could be. A CT scan of the pelvis with rectal contrast only may be helpful for further evaluation. Stomach/Bowel: Stomach, duodenum, small colon grossly normal. No acute inflammatory process, mass lesions or obstructive findings. There is  advanced sigmoid colon diverticulosis. Vascular/Lymphatic: Advanced atherosclerotic calcifications involving all of the abdominal and pelvic vessels. No mesenteric or retroperitoneal mass or adenopathy. Reproductive: Surgically absent. Other: Small amount of free abdominal fluid mainly around the spleen. There is also a small amount of free pelvic fluid in the cul-de-sac region. Diffuse and marked body wall edema is noted. Musculoskeletal: No significant bony findings. IMPRESSION: 1. Moderate amount of gas in the bladder is certainly suspicious for a colovesical fistula although a definite fistula is not identified. A CT scan of the pelvis with rectal contrast only may be helpful for further evaluation. 2. Small amount of free abdominal fluid and free pelvic fluid. 3. Cholelithiasis. 4. Very small right pleural effusion with minimal overlying atelectasis. 5. Diffuse and marked body wall edema. 6. Advanced vascular disease. Aortic Atherosclerosis (ICD10-I70.0). Electronically Signed   By: Marijo Sanes M.D.   On: 03/27/2022 16:52     Microbiology: No results found for this or any previous visit (from the past 240 hour(s)).   Labs: Basic Metabolic Panel: Recent Labs  Lab 04/01/22 0756 04/02/22 0703 04/03/22 0528 04/04/22 0349 04/05/22 0122 04/06/22 0244  NA 138 136 133* 133* 131* 138  K 3.5 3.5 3.7 3.8 3.7 4.1  CL 103 101 95* 98 96* 100  CO2 19* 18* 18* 20* 24 28  GLUCOSE 190* 133* 354* 310* 224* 128*  BUN 183* 183* 143* 152* 108* 66*  CREATININE 7.14* 7.42* 5.89* 6.45* 5.03* 3.92*  CALCIUM 6.9* 7.1* 7.3* 7.6* 7.3* 7.5*  MG 2.2 2.2  --   --   --   --   PHOS 7.9* 8.7* 8.1* 8.1* 5.3* 3.7   Liver Function Tests: Recent Labs  Lab 04/01/22 0756 04/02/22 0703 04/03/22 0528 04/04/22 0349 04/05/22 0122 04/06/22 0244  AST 14* 15  --   --   --   --   ALT 15 17  --   --   --   --   ALKPHOS 92 96  --   --   --   --   BILITOT 0.6 0.7  --   --   --   --   PROT 5.0* 4.9*  --   --   --   --   ALBUMIN 2.1* 2.1* 2.0* 2.0* 2.0* 1.9*   No results for input(s): "LIPASE", "AMYLASE" in the last 168 hours. No results for input(s): "AMMONIA" in the last 168 hours. CBC: Recent Labs  Lab 03/30/22 1018 03/30/22 1720 04/01/22 0756 04/01/22 1505 04/02/22 0703 04/03/22 0528 04/04/22 0349 04/05/22 0122 04/06/22 0244  WBC 8.4   < > 9.7  --  10.2 7.3 8.9 9.9 9.8  NEUTROABS 6.9  --  7.7  --  8.3*  --   --   --   --   HGB 8.7*   < > 7.3*   < > 7.8* 7.9* 8.1* 7.7* 7.8*  HCT 28.0*   < > 23.6*   < > 25.0* 25.1* 25.8* 25.2* 25.8*  MCV 84.3   < > 82.2  --  82.5 82.0 81.1 82.9 82.4  PLT 116*   < > 103*  --  107* 107* 121* 122* 129*   < > = values in this interval not displayed.   Cardiac Enzymes: No results for input(s): "CKTOTAL", "CKMB", "CKMBINDEX", "TROPONINI" in the last 168 hours. BNP: BNP (last 3 results) Recent Labs    01/29/22 1147 02/01/22 0615 02/25/22 1051  BNP 891.1* 451.5* 1,142.6*    ProBNP (last 3 results) No  results for input(s): "PROBNP" in the last 8760 hours.  CBG: Recent Labs  Lab  04/05/22 1606 04/05/22 2127 04/06/22 0157 04/06/22 0710 04/06/22 0735  GLUCAP 203* 194* 139* 69* 83       Signed:  Nita Sells MD   [facility] 04/06/2022, 10:05 AM

## 2022-04-06 NOTE — Plan of Care (Signed)
  Problem: Education: Goal: Ability to describe self-care measures that may prevent or decrease complications (Diabetes Survival Skills Education) will improve Outcome: Progressing Goal: Individualized Educational Video(s) Outcome: Progressing   Problem: Coping: Goal: Ability to adjust to condition or change in health will improve Outcome: Progressing   Problem: Fluid Volume: Goal: Ability to maintain a balanced intake and output will improve Outcome: Progressing   Problem: Health Behavior/Discharge Planning: Goal: Ability to identify and utilize available resources and services will improve Outcome: Progressing Goal: Ability to manage health-related needs will improve Outcome: Progressing   Problem: Metabolic: Goal: Ability to maintain appropriate glucose levels will improve Outcome: Progressing   Problem: Nutritional: Goal: Maintenance of adequate nutrition will improve Outcome: Progressing Goal: Progress toward achieving an optimal weight will improve Outcome: Progressing   Problem: Skin Integrity: Goal: Risk for impaired skin integrity will decrease Outcome: Progressing   Problem: Tissue Perfusion: Goal: Adequacy of tissue perfusion will improve Outcome: Progressing   Problem: Health Behavior/Discharge Planning: Goal: Ability to manage health-related needs will improve Outcome: Progressing   Problem: Clinical Measurements: Goal: Ability to maintain clinical measurements within normal limits will improve Outcome: Progressing Goal: Will remain free from infection Outcome: Progressing Goal: Diagnostic test results will improve Outcome: Progressing Goal: Respiratory complications will improve Outcome: Progressing Goal: Cardiovascular complication will be avoided Outcome: Progressing   Problem: Activity: Goal: Risk for activity intolerance will decrease Outcome: Progressing   Problem: Nutrition: Goal: Adequate nutrition will be maintained Outcome:  Progressing   Problem: Coping: Goal: Level of anxiety will decrease Outcome: Progressing   Problem: Elimination: Goal: Will not experience complications related to bowel motility Outcome: Progressing Goal: Will not experience complications related to urinary retention Outcome: Progressing   Problem: Pain Managment: Goal: General experience of comfort will improve Outcome: Progressing   Problem: Safety: Goal: Ability to remain free from injury will improve Outcome: Progressing   Problem: Skin Integrity: Goal: Risk for impaired skin integrity will decrease Outcome: Progressing   

## 2022-04-06 NOTE — TOC Progression Note (Addendum)
Transition of Care [facility]) - Progression Note    Patient Details  Name: Susan [name] MRN: [id number] Date of Birth: 07-15-1950  Transition of Care [facility]) CM/SW Contact  Bartholomew Crews, RN Phone Number: [phone] 04/06/2022, 11:14 AM  Clinical Narrative:     Spoke with patient on cell phone to discuss post acute transition. Patient stated that she can get in/out of car for transportation. She expressed concern about her husband providing care for her at home; however, she declined to consider going to SNF for STR. Discussed getting to/from HD - advised to contact [facility] through [place] on Monday. Verbal consent to speak with her spouse, but patient stated to not call her daughter stating she doesn't have time to assist. Patient stated that she could benefit from wheelchair.   Spoke with patient's spouse, Susan [name], on his cell phone. Joe stated that he will do what he can to assist patient as needed - he stated that he cannot lift patient, but he can assist with transfers and has a gait belt. He is agreeable to STR but understands patient's refusal. He plans to transport patient to/from HD, but was appreciative for information about [facility]. Spouse in agreement for wheelchair and requested to use [facility] stating he could pick it up. He stated that patient has rollator and BSC already. Faxed DME order and face sheet to [facility], TOC to make f/u call tomorrow when store reopens to ensure order received. Spouse to provide transportation home at discharge.   Discussed with both patient and spouse that patient is to have HD at hospital tomorrow morning in chair in order to demonstrate that she is strong enough to dialyze in chair, and that she will continue HD outpatient on Thursday.   [facility] notified of planned discharge tomorrow after HD. TOC following for transition needs.   Expected Discharge Plan: [place] Barriers to Discharge: Continued Medical  Work up  Expected Discharge Plan and Services Expected Discharge Plan: [place]   Discharge Planning Services: CM Consult Post Acute Care Choice: [place] arrangements for the past 2 months: Single Family Home Expected Discharge Date: 04/06/22               DME Arranged: Wheelchair manual DME Agency: [facility] Date DME Agency Contacted: 04/06/22 Time DME Agency Contacted: 74 Representative spoke with at DME Agency: order and face sheet faxed to [phone] [facility] Arranged: PT, OT, RN [facility] Agency: [place] Date [facility] Agency Contacted: 04/06/22 Time [place]: 1114 Representative spoke with at [place]: Amy   Social Determinants of Health ([place]) Interventions    Readmission Risk Interventions    02/07/2022    2:57 PM 01/23/2022   12:38 PM 05/28/2020   12:50 PM  Readmission Risk Prevention Plan  Transportation Screening Complete Complete Complete  PCP or Specialist Appt within 3-5 Days  Complete   HRI or [place]  Complete   Social Work Consult for [place] Planning/Counseling  Complete   Palliative Care Screening  Not Applicable   Medication Review Press photographer) Complete Complete Complete  PCP or Specialist appointment within 3-5 days of discharge Complete  Complete  HRI or [place] Complete  Complete  SW Recovery Care/Counseling Consult Complete  Complete  Palliative Care Screening Not Applicable  Not [place] Not Applicable  Not Applicable

## 2022-04-06 NOTE — Progress Notes (Signed)
Pt. Able to stand on edge of bed with 2 assist, but unable to pivot into the chair, pt. Transferred to chair via [place] steady and 2 assist, tol. Well  Anastasio Auerbach

## 2022-04-06 NOTE — Progress Notes (Signed)
Orthopedic Tech Progress Note Patient Details:  Susan [name] [birth year] [id number]  Ortho Devices Type of Ortho Device: Haematologist Ortho Device/Splint Location: rle Ortho Device/Splint Interventions: Ordered, Application, Adjustment  We were requested to apply the unna boot tonight as the patient was going to be discharged at her own request. Post Interventions Patient Tolerated: Well Instructions Provided: Care of device, Adjustment of device  Karolee Stamps 04/06/2022, 3:03 AM

## 2022-04-07 DIAGNOSIS — T7840XA Allergy, unspecified, initial encounter: Secondary | ICD-10-CM | POA: Insufficient documentation

## 2022-04-07 DIAGNOSIS — N250 Renal osteodystrophy: Secondary | ICD-10-CM | POA: Insufficient documentation

## 2022-04-07 DIAGNOSIS — Z89422 Acquired absence of other left toe(s): Secondary | ICD-10-CM | POA: Insufficient documentation

## 2022-04-07 DIAGNOSIS — M1030 Gout due to renal impairment, unspecified site: Secondary | ICD-10-CM | POA: Insufficient documentation

## 2022-04-07 DIAGNOSIS — Z992 Dependence on renal dialysis: Secondary | ICD-10-CM | POA: Insufficient documentation

## 2022-04-07 DIAGNOSIS — I132 Hypertensive heart and chronic kidney disease with heart failure and with stage 5 chronic kidney disease, or end stage renal disease: Secondary | ICD-10-CM | POA: Insufficient documentation

## 2022-04-07 DIAGNOSIS — Z8673 Personal history of transient ischemic attack (TIA), and cerebral infarction without residual deficits: Secondary | ICD-10-CM | POA: Insufficient documentation

## 2022-04-07 LAB — GLUCOSE, CAPILLARY
Glucose-Capillary: 119 mg/dL — ABNORMAL HIGH (ref 70–99)
Glucose-Capillary: 67 mg/dL — ABNORMAL LOW (ref 70–99)
Glucose-Capillary: 96 mg/dL (ref 70–99)
Glucose-Capillary: 64 mg/dL — ABNORMAL LOW (ref 70–99)

## 2022-04-07 LAB — RENAL FUNCTION PANEL
Albumin: 1.8 g/dL — ABNORMAL LOW (ref 3.5–5.0)
Anion gap: 10 (ref 5–15)
BUN: 77 mg/dL — ABNORMAL HIGH (ref 8–23)
CO2: 26 mmol/L (ref 22–32)
Calcium: 7.7 mg/dL — ABNORMAL LOW (ref 8.9–10.3)
Chloride: 99 mmol/L (ref 98–111)
Creatinine, Ser: 5.29 mg/dL — ABNORMAL HIGH (ref 0.44–1.00)
GFR, Estimated: 8 mL/min — ABNORMAL LOW (ref >60)
Glucose, Bld: 144 mg/dL — ABNORMAL HIGH (ref 70–99)
Phosphorus: 4.7 mg/dL — ABNORMAL HIGH (ref 2.5–4.6)
Potassium: 4.1 mmol/L (ref 3.5–5.1)
Sodium: 135 mmol/L (ref 135–145)

## 2022-04-07 LAB — CBC
HCT: 26.2 % — ABNORMAL LOW (ref 36.0–46.0)
Hemoglobin: 7.7 g/dL — ABNORMAL LOW (ref 12.0–15.0)
MCH: 25.4 pg — ABNORMAL LOW (ref 26.0–34.0)
MCHC: 29.4 g/dL — ABNORMAL LOW (ref 30.0–36.0)
MCV: 86.5 fL (ref 80.0–100.0)
Platelets: 136 10*3/uL — ABNORMAL LOW (ref 150–400)
RBC: 3.03 MIL/uL — ABNORMAL LOW (ref 3.87–5.11)
RDW: 18 % — ABNORMAL HIGH (ref 11.5–15.5)
WBC: 10.1 10*3/uL (ref 4.0–10.5)
nRBC: 0 % (ref 0.0–0.2)

## 2022-04-07 MED ORDER — HEPARIN SODIUM (PORCINE) 1000 UNIT/ML IJ SOLN
INTRAMUSCULAR | Status: AC
  Filled 2022-04-07: qty 4

## 2022-04-07 MED ORDER — ORAL CARE MOUTH RINSE: 15.0000 mL | OROMUCOSAL | Status: DC | PRN

## 2022-04-07 NOTE — Progress Notes (Signed)
Susan [name] to be discharged  home  per MD order. Discussed prescriptions and follow up appointments with the patient. Prescriptions given to patient; medication list explained in detail. Patient verbalized understanding.  Skin clean, dry and intact with no new wounds since admit, no evidence of skin tears noted. Leg wraps intact. IV catheter discontinued intact. Site without signs and symptoms of complications. Dressing and pressure applied. Pt denies pain at the site currently. No complaints noted.  Patient free of lines, drains, and wounds.   An After Visit Summary (AVS) was printed and given to the patient. Patient discharged home via [place].  Quentin Angst, RN

## 2022-04-07 NOTE — Progress Notes (Cosign Needed)
    Durable Medical Equipment  (From admission, onward)           Start     Ordered   04/07/22 1645  For home use only DME Hospital bed  Once       Question Answer Comment  Length of Need 12 Months   Patient has (list medical condition): Weakness, Impaired Balance, Decreased Activity Tolerance, and Pain   Head must be elevated greater than: 45 degrees   Bed type Semi-electric   Hoyer Lift Yes   Support Surface: Low Air loss Mattress      04/07/22 1651   04/06/22 1002  DME standard manual wheelchair with seat cushion  Once       Comments: Patient suffers from lower extremity wound and is in [name] which impairs their ability to perform daily activities like dressing, feeding, and grooming in the home.  A cane, crutch, or walker will not resolve issue with performing activities of daily living. A wheelchair will allow patient to safely perform daily activities. Patient can safely propel the wheelchair in the home or has a caregiver who can provide assistance. Length of need Lifetime. Accessories: elevating leg rests (ELRs), wheel locks, extensions and anti-tippers.   04/06/22 1004

## 2022-04-07 NOTE — TOC Transition Note (Signed)
Transition of Care [facility]) - CM/SW Discharge Note   Patient Details  Name: Susan [name] MRN: [id number] Date of Birth: April 11, 1951  Transition of Care [facility]) CM/SW Contact:  Tom-Johnson, Renea Ee, RN Phone Number: 04/07/2022, 5:29 PM   Clinical Narrative:     Patient is scheduled for discharge today. Daughter, [name] requested hoyer lift stating patient's husband will not be able to transfer patient by himself at home. Patient was given the choice to go to rehab but declined. MD notified and Hospital bed and hoyer lift ordered. Zayah with [employer] and order faxed to [phone] with successful notice received.  [name] also requested transportation to and from dialysis. This was discussed with patient and spouse last week and this weekend by this CM and weekend CM and spouse declined stating he can transport patient himself. CM notified [name] of the time request is made which was 16:45 and [facility] office is closed. CM gave [name] [facility] contact to call and schedule transportation. CM will call [facility] tomorrow as well to ensure that transportation to and from dialysis has been arranged.  [facility]AR scheduled for transportation at discharge. No further TOC needs noted.     Final next level of care: [place] Barriers to Discharge: Barriers Resolved   Patient Goals and CMS Choice Patient states their goals for this hospitalization and ongoing recovery are:: To return home CMS Medicare.gov Compare Post Acute Care list provided to:: Patient Choice offered to / list presented to : Patient, Spouse, Adult Children (Daughter, [name])  Discharge Placement                Patient to be transferred to facility by: [place]      Discharge Plan and Services   Discharge Planning Services: CM Consult Post Acute Care Choice: Home Health          DME Arranged: Hospital bed, Other see comment [place] Lift) DME Agency: [facility] Date DME Agency Contacted:  04/07/22 Time DME Agency Contacted: [phone] Representative spoke with at DME Agency: Zayah and order faxed to [phone] [facility] Arranged: PT, OT, RN [facility] Agency: [place] Date [place]: 04/06/22 Time [place]: 1114 Representative spoke with at [place]: [place] ([place]) Interventions     Readmission Risk Interventions    02/07/2022    2:57 PM 01/23/2022   12:38 PM 05/28/2020   12:50 PM  Readmission Risk Prevention Plan  Transportation Screening Complete Complete Complete  PCP or Specialist Appt within 3-5 Days  Complete   HRI or [place]  Complete   Social Work Consult for [place] Planning/Counseling  Complete   Palliative Care Screening  Not Applicable   Medication Review Press photographer) Complete Complete Complete  PCP or Specialist appointment within 3-5 days of discharge Complete  Complete  HRI or [place] Complete  Complete  SW Recovery Care/Counseling Consult Complete  Complete  Palliative Care Screening Not Applicable  Not [place] Not Applicable  Not Applicable

## 2022-04-07 NOTE — Progress Notes (Signed)
PT Cancellation Note  Patient Details Name: Remmy Crass MRN: [id number] DOB: [birth year]   Cancelled Treatment:    Reason Eval/Treat Not Completed: Patient at procedure or test/unavailable  Currently in HD;  Will follow up later today as time allows;  Otherwise, will follow up for PT tomorrow;   Thank you,  Roney Marion, [place] [facility] [place] 04/07/2022, 10:08 AM

## 2022-04-07 NOTE — Progress Notes (Signed)
DISCHARGE NOTE HOME Susan [name] to be discharged Home per MD order. Patient diagnosis, treatment plan, medications, and follow up appointment discussed. Prescriptions medication list explained in detail. Patient understands how to use insulin pump and check glucose.Patient verbalized knowledge and understanding.  IV catheter discontinued intact. Site without signs and symptoms of complications. Dressing and pressure applied. Pt denies pain at the site currently. No complaints noted. Patient has HD cath in place dry and intact.   Patient bi lateral legs wrapped and dry and intact.  Fistula left upper arm.   An After Visit Summary (AVS) was printed and given to the patient. Patient advised to return to ED if symptoms return.   Virgina Jock, RN

## 2022-04-07 NOTE — Procedures (Signed)
HD Note:  Some information was entered later than the data was gathered due to patient care needs. The stated time with the data is accurate.  Received patient in dialysis chair to unit.  Alert and oriented.  Informed consent signed and in chart.  Patient was successful using the chair for dialysis without any chair related issues.  Patient had one episode of stomach cramps that caused a time with UF turned off.  Transported back to the room  Alert, without acute distress.  Hand-off given to patient's nurse.   Access used: Right chest HD catheter Access issues: None  Total UF removed: [facility]

## 2022-04-07 NOTE — Progress Notes (Signed)
Seen stable no distress  Confirmed sitting at HD--can go home later today will need ambulance transport  No charge  Verneita Griffes, MD [facility] 10:31 AM

## 2022-04-07 NOTE — Consult Note (Signed)
[place] Nurse wound follow up Wound type:Left LE Profore dressing change Measurement:scattered full thickness wounds to LLE Wound bed:red, dry Drainage (amount, consistency, odor) None Periwound:intact Dressing procedure/placement/frequency: Profore dressing removed and adaptic nonadherent gauze removed. Wound cleansed with NS, and patted dry. Adaptic placed over wound. Profore 4-layer bandaging system applied. Patient and daughter have no questions and patient is comfortable both during and following the procedure. Next change is due on 11/20.  [place] nursing team will not follow, but will remain available to this patient, the nursing and medical teams.  Please re-consult if needed.  Thank you for inviting [name] to participate in this patient's Plan of Care.  Maudie Flakes, MSN, RN, CNS, [place], Serita Grammes, [employer], [employer] phone:  [phone]

## 2022-04-07 NOTE — Progress Notes (Signed)
Susan [name] to be D/C'd  per MD order.  Discussed with the patient's son and all questions fully answered.  VSS, Skin clean, dry and intact without evidence of skin break down, no evidence of skin tears noted.  IV catheter discontinued intact. Site without signs and symptoms of complications. Dressing and pressure applied.  An After Visit Summary was printed and given to the patient. Patient received prescription.  D/c education completed with patient/family including follow up instructions, medication list, d/c activities limitations if indicated, with other d/c instructions as indicated by MD - patient able to verbalize understanding, all questions fully answered.   Patient instructed to return to ED, call 911, or call MD for any changes in condition.   Patient to be escorted via [place], and D/C home via [place].

## 2022-04-07 NOTE — Progress Notes (Signed)
[facility] NEPHROLOGY PROGRESS NOTE  Assessment/ Plan: Pt is a 71 y.o. 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 Hollie Salk with [facility].   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 Appreciate VVS Dr. Trula Slade placing RIJ TC and 1st stage BBF on 11/8 and received first dialysis. She is able to tolerate dialysis on chair without any issues.  It seems like she has outpatient center already arranged [facility].  Patient verbalized understanding and will be going home this afternoon.  # HFrEF with EF 30% in the setting of severe TR more refractory to diuretics at this time.  Volume managing with dialysis.  # Anemia with iron deficiency: Received IV iron load in the hospital.  Continue erythropoietin as outpatient.  # Secondary hyperparathyroidism: Phosphorusand corrected calcium level acceptable.  On sevelamer.  # HTN/volume: Blood pressure variable.  Volume managing with dialysis.  Okay to discharge from renal perspective.    Subjective: Seen and examined at HD unit.  As she is receiving HD on chair and tolerating well.  Denies nausea, vomiting, chest pain, shortness of breath. Objective Vital signs in last 24 hours: Vitals:   04/06/22 1957 04/07/22 0952 04/07/22 1003 04/07/22 1030  BP: (!) 130/58 (!) 111/47 (!) 114/52 (!) 111/53  Pulse: 75 72 69 73  Resp: '18 16 10 10  '$ Temp: 98.5 F (36.9 C) 98.3 F (36.8 C)    TempSrc: Oral Oral    SpO2: 95% 99% 98% 97%  Weight:      Height:       Weight change:   Intake/Output Summary (Last 24 hours) at 04/07/2022 1058 Last data filed at 04/07/2022 0600 Gross per 24 hour  Intake 600 ml  Output  0 ml  Net 600 ml       Labs: RENAL PANEL Recent Labs    01/14/22 2245 01/15/22 0135 01/16/22 1345 01/16/22 1559 01/17/22 0108 01/18/22 1334 02/01/22 0615 02/01/22 1314 02/03/22 0600 02/04/22 0542 02/05/22 0600 02/06/22 0543 02/07/22 0813 02/08/22 0438 02/10/22 1016 02/11/22 0640 02/17/22 1417 03/06/22 1523 03/26/22 1620 03/30/22 1018 03/31/22 0414 04/01/22 0756 04/02/22 0703 04/03/22 0528 04/04/22 0349 04/05/22 0122 04/06/22 0244  NA 114*   < > 122*   < > 130*   < > 132*   < > 133* 130*   < > 133* 137   < > 137 139   < > 137 137 136 139 138 136 133* 133* 131* 138  K 4.0   < > >7.5*   < > 3.7   < > 3.9   < > 4.0 5.1   < > 4.5 4.6   < > 4.7 4.3   < > 4.1 3.6 3.4* 3.4* 3.5 3.5 3.7 3.8 3.7 4.1  CL 73*   < > 99   < > 94*   < > 90*   < > 96* 94*   < > 97* 100   < > 100 101   < > 95* 98 97* 101 103 101 95* 98 96* 100  CO2 14*   < > 21*   < > 24   < > 26   < > 24  23   < > 26 25   < > 25 27   < > 26 21 21* 19* 19* 18* 18* 20* 24 28  GLUCOSE >1,200*   < > 205*   < > 219*   < > 246*   < > 149* 391*   < > 197* 110*   < > 284* 112*   < > 352* 271* 261* 275* 190* 133* 354* 310* 224* 128*  BUN 96*   < > 87*   < > 90*   < > 94*   < > 85* 84*   < > 82* 77*   < > 69* 68*   < > 127* 169* 177* 180* 183* 183* 143* 152* 108* 66*  CREATININE 5.29*   < > 3.66*   < > 3.68*   < > 3.93*   < > 3.71* 3.44*   < > 3.29* 3.12*   < > 3.19* 2.92*   < > 5.05* 6.22* 7.14* 7.09* 7.14* 7.42* 5.89* 6.45* 5.03* 3.92*  CALCIUM 7.8*   < > 7.6*   < > 8.3*   < > 8.5*   < > 8.3* 8.4*   < > 8.3* 8.6*   < > 9.4 9.7   < > 8.0* 7.2* 6.9* 6.9* 6.9* 7.1* 7.3* 7.6* 7.3* 7.5*  MG 2.0  --  1.4*  --  2.4  --  1.8  --   --   --   --  1.9 2.2  --  1.9 1.8  --   --   --   --   --  2.2 2.2  --   --   --   --   PHOS  --   --  3.6  --  4.0  --   --   --   --   --   --   --   --   --  3.8 3.2  --   --   --   --   --  7.9* 8.7* 8.1* 8.1* 5.3* 3.7  ALBUMIN  --   --   --   --   --    < > 2.4*   < > 2.1* 2.3*  --   --   --   --  2.6*  2.8*  --   --   --   --   --  2.1* 2.1* 2.0* 2.0* 2.0* 1.9*   < > = values in this interval not displayed.     Liver Function Tests: Recent Labs  Lab 04/01/22 0756 04/02/22 0703 04/03/22 0528 04/04/22 0349 04/05/22 0122 04/06/22 0244  AST 14* 15  --   --   --   --   ALT 15 17  --   --   --   --   ALKPHOS 92 96  --   --   --   --   BILITOT 0.6 0.7  --   --   --   --   PROT 5.0* 4.9*  --   --   --   --   ALBUMIN 2.1* 2.1*   < > 2.0* 2.0* 1.9*   < > = values in this interval not displayed.   No results for input(s): "LIPASE", "AMYLASE" in the last 168 hours. No results for input(s): "AMMONIA" in the last 168 hours. CBC: Recent Labs    04/01/22 1505 04/02/22 0703 04/03/22 0528 04/04/22 0349 04/05/22 0122 04/06/22 0244 04/07/22 1004  HGB 7.9* 7.8*  7.9* 8.1* 7.7* 7.8* 7.7*  MCV  --  82.5 82.0 81.1 82.9 82.4 86.5  VITAMINB12  --  710  --   --   --   --   --   FOLATE  --  14.0  --   --   --   --   --   FERRITIN 109  --   --   --   --   --   --   TIBC 228*  --   --   --   --   --   --   IRON 15*  --   --   --   --   --   --     Cardiac Enzymes: No results for input(s): "CKTOTAL", "CKMB", "CKMBINDEX", "TROPONINI" in the last 168 hours. CBG: Recent Labs  Lab 04/06/22 1620 04/06/22 2107 04/07/22 0126 04/07/22 0741 04/07/22 0850  GLUCAP 124* 120* 119* 67* 96    Iron Studies: No results for input(s): "IRON", "TIBC", "TRANSFERRIN", "FERRITIN" in the last 72 hours. Studies/Results: No results found.  Medications: Infusions:   Scheduled Medications:  (feeding supplement) PROSource Plus  30 mL Oral Daily   amiodarone  200 mg Oral Daily   apixaban  5 mg Oral BID   aspirin EC  81 mg Oral Q breakfast   atorvastatin  80 mg Oral Daily   Chlorhexidine Gluconate Cloth  6 each Topical Q0600   darbepoetin (ARANESP) injection - DIALYSIS  100 mcg Subcutaneous Q Thu-1800   insulin aspart  0-15 Units Subcutaneous TID WC   insulin glargine-yfgn  22 Units Subcutaneous QHS    insulin pump   Subcutaneous TID WC, HS, 0200   levothyroxine  50 mcg Oral Q0600   linagliptin  5 mg Oral Daily   multivitamin  1 tablet Oral QHS   pregabalin  75 mg Oral Daily   sevelamer carbonate  1,600 mg Oral TID WC   ursodiol  300 mg Oral Daily   vortioxetine HBr  20 mg Oral Daily    have reviewed scheduled and prn medications.  Physical Exam: General:NAD, comfortable Heart:RRR, s1s2 nl Lungs:clear b/l, no crackle Abdomen:soft, Non-tender, non-distended Extremities: Bilateral leg edema+ Dialysis Access: Right IJ TDC in place, good thrill in left AV fistula, mildly swollen.  Susan [name] 04/07/2022,10:58 AM  LOS: 8 days

## 2022-04-07 NOTE — Progress Notes (Signed)
Pt to d/c to home today. Met with pt and pt's daughter at bedside this afternoon. Advised both that nephrologist feels pt can begin at out-pt HD clinic on Thursday due to pt having treatment today in a chair to make sure pt is able to tolerate. Contacted [facility] and spoke to [place]. Clinic advised pt will d/c today and will start on Thursday due to pt receiving HD today. Contacted renal PA to request that orders be sent to clinic. Also updated pt's AVS as well.   Melven Sartorius Renal Navigator [phone]

## 2022-04-07 NOTE — Progress Notes (Signed)
New Dialysis Start   Patient identified as new dialysis start. Kidney Education packet assembled and given. Discussed the following items with patient:    Current medications and possible changes once started:  Discussed that patient's medications may change over time.  Ex; hypertension medications and diabetes medication.  Nephrologists will adjust as needed.  Fluid restrictions reviewed:  32 oz daily goal:  All liquids count; soups, ice, jello   Phosphorus and potassium: Handout given showing high potassium and phosphorus foods.  Alternative food and drink options given.  Family support:  no family present  Outpatient Clinic Resources:  Discussed roles of Outpatient clinic  staff and advised to make a list of needs, if any, to talk with outpatient staff if needed  Care plan schedule: Informed patient  of Care Plans in outpatient setting and to participate in the care plan.  An invitation would be given from outpatient clinic.   Dialysis Access Options:  Reviewed access options with patients. Discussed in detail about care at home with new AVG & AVF. Reviewed checking bruit and thrill. If dialysis catheter present, educated that patient could not take showers.  Catheter dressing changes were to be done by outpatient clinic staff only  Home therapy options:  Educated patient about home therapy options:  PD vs home hemo.  Patient stated she is not interested at this time in home therapies.   Patient verbalized understanding. Will continue to round on patient during admission.    Lilia Argue, RN

## 2022-04-08 DIAGNOSIS — I130 Hypertensive heart and chronic kidney disease with heart failure and stage 1 through stage 4 chronic kidney disease, or unspecified chronic kidney disease: Secondary | ICD-10-CM | POA: Diagnosis not present

## 2022-04-08 DIAGNOSIS — I5022 Chronic systolic (congestive) heart failure: Secondary | ICD-10-CM | POA: Diagnosis not present

## 2022-04-08 DIAGNOSIS — Z794 Long term (current) use of insulin: Secondary | ICD-10-CM

## 2022-04-08 DIAGNOSIS — N184 Chronic kidney disease, stage 4 (severe): Secondary | ICD-10-CM | POA: Diagnosis not present

## 2022-04-08 DIAGNOSIS — E1122 Type 2 diabetes mellitus with diabetic chronic kidney disease: Secondary | ICD-10-CM | POA: Diagnosis not present

## 2022-04-08 NOTE — TOC Transition Note (Signed)
Transition of Care [facility]) - CM/SW Discharge Note   Patient Details  Name: Susan [name] MRN: [id number] Date of Birth: 1950/11/29  Transition of Care [facility]) CM/SW Contact:  Tom-Johnson, Renea Ee, RN Phone Number: 04/08/2022, 3:50 PM   Clinical Narrative:     CM contacted [facility] about scheduling transportation for dialysis and was told patient needs to have Medicaid to qualify. Patient has [employer]. CM called [facility], [employer] and [employer] and all states they only accept private pay or Medicaid. CM notified patient's daughter, [name] and gave her the contact for transportation stated above. CM also advised [name] to apply for Medicaid and see if patient is eligible. [name] thanked CM for all the assistance and states she will call and arrange transportation. No further TOC needs noted .     Final next level of care: [place] Barriers to Discharge: Barriers Resolved   Patient Goals and CMS Choice Patient states their goals for this hospitalization and ongoing recovery are:: To return home CMS Medicare.gov Compare Post Acute Care list provided to:: Patient Choice offered to / list presented to : Patient, Spouse, Adult Children (Daughter, [name])  Discharge Placement                Patient to be transferred to facility by: [place]      Discharge Plan and Services   Discharge Planning Services: CM Consult Post Acute Care Choice: Home Health          DME Arranged: Hospital bed, Other see comment [facility]) DME Agency: [facility] Date DME Agency Contacted: 04/07/22 Time DME Agency Contacted: [phone] Representative spoke with at DME Agency: Zayah and order faxed to [phone] [facility] Arranged: PT, OT, RN [facility] Agency: [place] Date [place]: 04/06/22 Time [place]: 1114 Representative spoke with at [place]: [place] ([place]) Interventions      Readmission Risk Interventions    02/07/2022    2:57 PM 01/23/2022   12:38 PM 05/28/2020   12:50 PM  Readmission Risk Prevention Plan  Transportation Screening Complete Complete Complete  PCP or Specialist Appt within 3-5 Days  Complete   HRI or [place]  Complete   Social Work Consult for [place] Planning/Counseling  Complete   Palliative Care Screening  Not Applicable   Medication Review Press photographer) Complete Complete Complete  PCP or Specialist appointment within 3-5 days of discharge Complete  Complete  HRI or [place] Complete  Complete  SW Recovery Care/Counseling Consult Complete  Complete  Palliative Care Screening Not Applicable  Not [place] Not Applicable  Not Applicable

## 2022-04-09 ENCOUNTER — Telehealth: Payer: Self-pay | Admitting: Nephrology

## 2022-04-09 ENCOUNTER — Telehealth: Payer: Self-pay | Admitting: Physician Assistant

## 2022-04-09 DIAGNOSIS — D689 Coagulation defect, unspecified: Secondary | ICD-10-CM | POA: Insufficient documentation

## 2022-04-09 NOTE — Telephone Encounter (Signed)
Transition of care contact from inpatient facility  Date of Discharge: 04/08/22 Date of Contact: attempted  04/09/22  Method of contact: Phone  Attempted to contact patient to discuss transition of care from inpatient admission. Patient did not answer the phone. Mailbox full will be in contact with pt. At her OP kidney center on TTS  next rounds

## 2022-04-09 NOTE — Telephone Encounter (Signed)
Prior TCM   Documentation  was for admit 11/05 to dc 04/06/22  = correct dc date

## 2022-04-09 NOTE — Telephone Encounter (Signed)
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  F/u in 6 weeks on WB clinic day with dialysis duplex.  Thanks

## 2022-04-11 ENCOUNTER — Telehealth: Payer: Self-pay

## 2022-04-11 ENCOUNTER — Ambulatory Visit (HOSPITAL_COMMUNITY): Payer: HMO

## 2022-04-11 NOTE — Patient Outreach (Signed)
  Care Coordination TOC Note Transition Care Management Follow-up Telephone Call Date of discharge and from where: Susan [name] 03/30/22-04/07/22 How have you been since you were released from the hospital? Patient states she is not doing too well.  She commented that her husband is upset because she is not able to stand and cook meals or do anything in the house.  She noted home health has not called her and she became weepy, then stating she could not talk anymore and hung up the phone. Any questions or concerns? Yes-Home Health has not contacted her  Items Reviewed: Did the pt receive and understand the discharge instructions provided? Yes  Medications obtained and verified? Yes  Other? No  Any new allergies since your discharge? No  Dietary orders reviewed? No Do you have support at home? No   Home Care and Equipment/Supplies: Were home health services ordered? yes If so, what is the name of the agency? [facility]  Has the agency set up a time to come to the patient's home? no Were any new equipment or medical supplies ordered?  Yes: [employer] What is the name of the medical supply agency? [facility] Were you able to get the supplies/equipment? yes Do you have any questions related to the use of the equipment or supplies? No  Functional Questionnaire: (I = Independent and D = Dependent) ADLs: Did not complete with patient as she disconnected call  Bathing/Dressing- n/a  Meal Prep- D  Eating- I  Maintaining continence- N/A  Transferring/Ambulation- D  Managing Meds- I  Follow up appointments reviewed:  PCP Hospital f/u appt confirmed? Yes  Scheduled to see Dr. Dennard Schaumann on 04/23/22 @ 2:00. [facility] f/u appt confirmed? Yes  Scheduled to see Dr. Sharol Given on 04/14/22 @ 4PM. Are transportation arrangements needed? No  If their condition worsens, is the pt aware to call PCP or go to the Emergency Dept.? Yes Was the patient provided with contact  information for the PCP's office or ED? Yes Was to pt encouraged to call back with questions or concerns? Yes  SDOH assessments and interventions completed:   Yes  Care Coordination Interventions Activated:  Yes   Care Coordination Interventions:  Call placed to [facility] and spoke with Dominica Severin who said he was not clinical and would have someone call this RNCM back to confirm they will contact patient.    Encounter Outcome:  Pt. Scheduled

## 2022-04-14 ENCOUNTER — Ambulatory Visit: Payer: HMO | Admitting: Internal Medicine

## 2022-04-14 ENCOUNTER — Ambulatory Visit (INDEPENDENT_AMBULATORY_CARE_PROVIDER_SITE_OTHER): Payer: PPO | Admitting: Orthopedic Surgery

## 2022-04-14 DIAGNOSIS — L97929 Non-pressure chronic ulcer of unspecified part of left lower leg with unspecified severity: Secondary | ICD-10-CM | POA: Diagnosis not present

## 2022-04-14 DIAGNOSIS — Z89431 Acquired absence of right foot: Secondary | ICD-10-CM

## 2022-04-14 DIAGNOSIS — I83029 Varicose veins of left lower extremity with ulcer of unspecified site: Secondary | ICD-10-CM | POA: Diagnosis not present

## 2022-04-14 DIAGNOSIS — I83019 Varicose veins of right lower extremity with ulcer of unspecified site: Secondary | ICD-10-CM | POA: Diagnosis not present

## 2022-04-14 DIAGNOSIS — L97919 Non-pressure chronic ulcer of unspecified part of right lower leg with unspecified severity: Secondary | ICD-10-CM | POA: Diagnosis not present

## 2022-04-14 DIAGNOSIS — N2581 Secondary hyperparathyroidism of renal origin: Secondary | ICD-10-CM | POA: Insufficient documentation

## 2022-04-15 ENCOUNTER — Other Ambulatory Visit: Payer: Self-pay

## 2022-04-15 DIAGNOSIS — N184 Chronic kidney disease, stage 4 (severe): Secondary | ICD-10-CM

## 2022-04-15 DIAGNOSIS — I5032 Chronic diastolic (congestive) heart failure: Secondary | ICD-10-CM

## 2022-04-15 DIAGNOSIS — I739 Peripheral vascular disease, unspecified: Secondary | ICD-10-CM

## 2022-04-15 DIAGNOSIS — I872 Venous insufficiency (chronic) (peripheral): Secondary | ICD-10-CM

## 2022-04-15 DIAGNOSIS — I83029 Varicose veins of left lower extremity with ulcer of unspecified site: Secondary | ICD-10-CM

## 2022-04-15 DIAGNOSIS — E46 Unspecified protein-calorie malnutrition: Secondary | ICD-10-CM | POA: Insufficient documentation

## 2022-04-18 ENCOUNTER — Encounter (HOSPITAL_COMMUNITY): Payer: Self-pay | Admitting: Surgery

## 2022-04-20 ENCOUNTER — Encounter: Payer: Self-pay | Admitting: Orthopedic Surgery

## 2022-04-20 NOTE — Progress Notes (Signed)
Office Visit Note   Patient: Susan [name]           Date of Birth: [birth year]           MRN: [id number] Visit Date: 04/14/2022              Requested by: [name], MD [address] [place],  [place] [address] PCP: [name], MD  Chief Complaint  Patient presents with   Right Leg - Wound Check   Left Leg - Wound Check      HPI: Patient is a 71 year old woman who presents in follow-up for bilateral lower extremity venous insufficiency ulcers.  Patient is currently in a study for the venous ulcer of the left lower extremity.  She underwent an office Kerecis donated micro graft application 19 cm.  Patient is now on dialysis with dialysis Tuesday Thursday and Saturday.  Patient has developed a new venous ulcer on the right lower extremity.  Assessment & Plan: Visit Diagnoses:  1. 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 History of transmetatarsal amputation of right foot ([place])     Plan: Will reevaluate both lower extremities in 1 week.  The left leg venous stasis ulcer is completely healed.  Will continue with compression wraps of both lower extremities.  Follow-Up Instructions: Return in about 1 week (around 04/21/2022).   Ortho Exam  Patient is alert, oriented, no adenopathy, well-dressed, normal affect, normal respiratory effort. Examination patient has a new large venous ulcer in the posterior aspect of the left calf.  The left leg venous ulcer has completely healed after 1 application of the Kerecis micro graft.  Patient has a black eschar on the right heel.  Imaging: No results found.      Labs: Lab Results  Component Value Date   HGBA1C >15.5 (H) 01/20/2022   HGBA1C >15.5 (H) 01/19/2022   HGBA1C >15.5 (H) 01/14/2022   CRP 0.7 08/22/2020   CRP 1.5 (H) 03/13/2020   CRP 1.9 (H) 03/12/2020   LABURIC 4.7 08/09/2020   LABURIC 9.1 (H) 10/12/2018   LABURIC 9.0 (H) 03/06/2018   REPTSTATUS 02/08/2022 FINAL  02/03/2022   CULT  02/03/2022    NO GROWTH 5 DAYS Performed at [facility], [place] [address], [place], [place] [address]    LABORGA ENTEROCOCCUS FAECIUM 02/01/2022   LABORGA ENTEROCOCCUS FAECALIS 02/01/2022     Lab Results  Component Value Date   ALBUMIN 1.8 (L) 04/07/2022   ALBUMIN 1.9 (L) 04/06/2022   ALBUMIN 2.0 (L) 04/05/2022    Lab Results  Component Value Date   MG 2.2 04/02/2022   MG 2.2 04/01/2022   MG 1.8 02/11/2022   No results found for: "VD25OH"  No results found for: "PREALBUMIN"    Latest Ref Rng & Units 04/07/2022   10:04 AM 04/06/2022    2:44 AM 04/05/2022    1:22 AM  CBC EXTENDED  WBC 4.0 - 10.5 K/uL 10.1  9.8  9.9   RBC 3.87 - 5.11 MIL/uL 3.03  3.13  3.04   Hemoglobin 12.0 - 15.0 g/dL 7.7  7.8  7.7   HCT 36.0 - 46.0 % 26.2  25.8  25.2   Platelets 150 - 400 K/uL 136  129  122      There is no height or weight on file to calculate BMI.  Orders:  No orders of the defined types were placed in this encounter.  No orders of  the defined types were placed in this encounter.    Procedures: No procedures performed  Clinical Data: No additional findings.  ROS:  All other systems negative, except as noted in the HPI. Review of Systems  Objective: Vital Signs: There were no vitals taken for this visit.  Specialty Comments:  No specialty comments available.  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   Sepsis without acute organ dysfunction ([place])    Bacteremia    Acute pancreatitis 02/01/2022   Abdominal pain 02/01/2022   SIRS (systemic inflammatory response syndrome) ([place]) 02/01/2022   Transaminitis 02/01/2022   History of anemia due to chronic kidney disease 02/01/2022   Paroxysmal atrial fibrillation ([place]) 02/01/2022   DKA (diabetic ketoacidosis) ([place])  01/14/2022   NSTEMI (non-ST elevated myocardial infarction) ([place]) 03/05/2021   Acute renal failure superimposed on stage 4 chronic kidney disease ([place]) 08/22/2020   Hypoalbuminemia 05/25/2020   GERD (gastroesophageal reflux disease) 05/25/2020   Pressure injury of skin 05/17/2020   Type 2 diabetes mellitus with diabetic polyneuropathy, with long-term current use of insulin ([place]) 03/07/2020   Obesity, Class III, BMI 40-49.9 (morbid obesity) ([place]) 03/07/2020   Common bile duct (CBD) obstruction 05/28/2019   Benign neoplasm of ascending colon    Benign neoplasm of transverse colon    Benign neoplasm of descending colon    Benign neoplasm of sigmoid colon    Gastric polyps    Hyperkalemia 03/11/2019   Prolonged QT interval 03/11/2019   Onychomycosis 06/21/2018   Osteomyelitis of second toe of right foot ([place])    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 (H[facility])    PVD (peripheral vascular disease) ([place]) 10/26/2017   E-coli UTI 07/27/2017   Hypothyroidism 07/27/2017   AKI (acute kidney injury) ([place])    [place] (pulmonary artery hypertension) ([place])    Impaired ambulation 07/19/2017   Nausea & vomiting 07/15/2017   Leg cramps 02/27/2017   Peripheral edema 01/12/2017   Diabetic neuropathy ([place]) 11/12/2016   CKD (chronic kidney disease), stage IV ([place]) 10/24/2015   Anemia 10/03/2015   Generalized anxiety disorder 10/03/2015   Insomnia 10/03/2015   Hyperglycemia due to diabetes mellitus ([place]) 06/07/2015   Chronic diastolic CHF (congestive heart failure) ([place]) 06/07/2015   Non compliance with medical treatment 04/17/2014   Rotator cuff tear 03/14/2014   Class 3 obesity ([place]) 09/23/2013   Chronic HFrEF (heart failure with reduced ejection fraction) ([place]) 06/03/2013   Hypotension 12/25/2012   Urinary incontinence    MDD (major depressive disorder) 11/12/2010   RBBB (right bundle branch block)    Coronary artery disease    Hyperlipemia 01/22/2009   Essential hypertension  01/22/2009   Past Medical History:  Diagnosis Date   Acute MI ([place]) 1999; 2007   Anemia    hx   Anginal pain ([place])    Anxiety    ARF (acute renal failure) ([place]) 06/2017   [facility] Asso   Arthritis    "generalized" (03/15/2014)   CAD (coronary artery disease)    MI in 2000 - MI  2007 - treated bare metal stent (no nuclear since then as 9/11)   Carotid artery disease ([place])    CHF (congestive heart failure) ([place])    Chronic diastolic heart failure ([place])    a) ECHO (08/2013) EF 55-60% and RV function nl b) RHC (08/2013)  RA 4, RV 30/5/7, PA 25/10 (16), PCWP 7, Fick CO/CI 6.3/2.7, PVR 1.5 WU, PA 61 and 66%   Daily headache    "~ every other day; since I fell in June" (03/15/2014)   Depression    Dyslipidemia    Dyspnea    Exertional shortness of breath    HTN (hypertension)    Hypothyroidism    Neuropathy    Obesity    Osteoarthritis    Peripheral neuropathy    PONV (postoperative nausea and vomiting)    RBBB (right bundle branch block)    Old   Stroke ([place])    mini strokes   Syncope    likely due to low blood sugar   Tachycardia    Sinus tachycardia   Type II diabetes mellitus (HCC)    Type II   Urinary incontinence    Venous insufficiency     Family History  Problem Relation Age of Onset   Heart attack Mother 72    Past Surgical History:  Procedure Laterality Date   ABDOMINAL HYSTERECTOMY  1980's   AMPUTATION Right 02/24/2018   Procedure: RIGHT FOOT GREAT TOE AND 2ND TOE AMPUTATION;  Surgeon: Newt Minion, MD;  Location: [facility];  Service: Orthopedics;  Laterality: Right;   AMPUTATION Right 04/30/2018   Procedure: RIGHT TRANSMETATARSAL AMPUTATION;  Surgeon: Newt Minion, MD;  Location: [facility];  Service: Orthopedics;  Laterality: Right;   AV FISTULA PLACEMENT Left 04/02/2022   Procedure: LEFT ARM ARTERIOVENOUS (AV) FISTULA CREATION;  Surgeon: Serafina Mitchell, MD;  Location: [facility];  Service: Vascular;  Laterality: Left;  PERIPHERAL NERVE BLOCK   BIOPSY   05/27/2020   Procedure: BIOPSY;  Surgeon: [name], DO;  [facility];  Service: Endoscopy;;   CATARACT EXTRACTION, BILATERAL Bilateral ?2013   COLONOSCOPY W/ POLYPECTOMY     COLONOSCOPY WITH PROPOFOL N/A 03/13/2019   Procedure: COLONOSCOPY WITH PROPOFOL;  Surgeon: Jerene Bears, MD;  Location: [facility];  Service: Gastroenterology;  Laterality: N/A;   CORONARY ANGIOPLASTY WITH STENT PLACEMENT  1999; 2007   "1 + 1"   ERCP N/A 02/03/2022   Procedure: ENDOSCOPIC RETROGRADE CHOLANGIOPANCREATOGRAPHY (ERCP);  Surgeon: Carol Ada, MD;  Location: [facility];  Service: Gastroenterology;  Laterality: N/A;   ESOPHAGOGASTRODUODENOSCOPY (EGD) WITH PROPOFOL N/A 03/13/2019   Procedure: ESOPHAGOGASTRODUODENOSCOPY (EGD) WITH PROPOFOL;  Surgeon: Jerene Bears, MD;  [facility] ENDOSCOPY;  Service: Gastroenterology;  Laterality: N/A;   ESOPHAGOGASTRODUODENOSCOPY (EGD) WITH PROPOFOL N/A 05/27/2020   Procedure: ESOPHAGOGASTRODUODENOSCOPY (EGD) WITH PROPOFOL;  Surgeon: [name], DO;  [facility];  Service: Endoscopy;  Laterality: N/A;   EYE SURGERY Bilateral    [place]   HEMOSTASIS CLIP PLACEMENT  03/13/2019   Procedure: HEMOSTASIS CLIP PLACEMENT;  Surgeon: Jerene Bears, MD;  Location: [facility];  Service: Gastroenterology;;   INSERTION OF DIALYSIS CATHETER Right 04/02/2022   Procedure: INSERTION OF TUNNELED DIALYSIS CATHETER;  Surgeon: Serafina Mitchell, MD;  Location: [facility];  Service: Vascular;  Laterality: Right;   KNEE ARTHROSCOPY Left 10/25/2006   POLYPECTOMY  03/13/2019   Procedure: POLYPECTOMY;  Surgeon: Jerene Bears, MD;  Location: [facility] ENDOSCOPY;  Service: Gastroenterology;;   REMOVAL OF STONES  02/03/2022   Procedure: REMOVAL OF STONES;  Surgeon: Carol Ada, MD;  Location: [facility];  Service: Gastroenterology;;   RIGHT HEART CATH N/A 07/24/2017   Procedure: RIGHT HEART CATH;  Surgeon: Jolaine Artist, MD;  Location: [facility];  Service:  Cardiovascular;  Laterality: N/A;   RIGHT HEART CATHETERIZATION N/A 09/22/2013   Procedure: RIGHT HEART CATH;  Surgeon: Jolaine Artist, MD;  Location: [facility] CATH LAB;  Service: Cardiovascular;  Laterality: N/A;   SHOULDER ARTHROSCOPY WITH OPEN ROTATOR CUFF REPAIR Right 03/14/2014   Procedure: RIGHT SHOULDER ARTHROSCOPY WITH BICEPS RELEASE, OPEN SUBSCAPULA REPAIR, OPEN SUPRASPINATUS REPAIR.;  Surgeon: Meredith Pel, MD;  Location: [facility];  Service: Orthopedics;  Laterality: Right;   SPHINCTEROTOMY  02/03/2022   Procedure: SPHINCTEROTOMY;  Surgeon: Carol Ada, MD;  Location: [facility];  Service: Gastroenterology;;   TEE WITHOUT CARDIOVERSION N/A 02/04/2022   Procedure: TRANSESOPHAGEAL ECHOCARDIOGRAM (TEE);  Surgeon: Jolaine Artist, MD;  Location[facility] ENDOSCOPY;  Service: Cardiovascular;  Laterality: N/A;   TOE AMPUTATION Right 02/24/2018   GREAT TOE AND 2ND TOE AMPUTATION   TUBAL LIGATION  1970's   Social History   Occupational History    Employer: UNEMPLOYED  Tobacco Use   Smoking status: Former    Packs/day: 3.00    Years: 32.00    Total pack years: 96.00    Types: Cigarettes    Quit date: 10/24/1997    Years since quitting: 24.5   Smokeless tobacco: Never  Vaping Use   Vaping Use: Never used  Substance and Sexual Activity   Alcohol use: Not Currently    Comment: "might have 2-3 daiquiris in the summer"   Drug use: No   Sexual activity: Not Currently

## 2022-04-21 ENCOUNTER — Ambulatory Visit (INDEPENDENT_AMBULATORY_CARE_PROVIDER_SITE_OTHER): Payer: HMO | Admitting: Orthopedic Surgery

## 2022-04-21 ENCOUNTER — Other Ambulatory Visit: Payer: Self-pay

## 2022-04-21 DIAGNOSIS — I83029 Varicose veins of left lower extremity with ulcer of unspecified site: Secondary | ICD-10-CM

## 2022-04-21 DIAGNOSIS — L97919 Non-pressure chronic ulcer of unspecified part of right lower leg with unspecified severity: Secondary | ICD-10-CM | POA: Diagnosis not present

## 2022-04-21 DIAGNOSIS — Z89431 Acquired absence of right foot: Secondary | ICD-10-CM

## 2022-04-21 DIAGNOSIS — L97929 Non-pressure chronic ulcer of unspecified part of left lower leg with unspecified severity: Secondary | ICD-10-CM

## 2022-04-21 DIAGNOSIS — I83019 Varicose veins of right lower extremity with ulcer of unspecified site: Secondary | ICD-10-CM

## 2022-04-23 ENCOUNTER — Telehealth: Payer: Self-pay | Admitting: Family Medicine

## 2022-04-23 ENCOUNTER — Encounter: Payer: Self-pay | Admitting: Family Medicine

## 2022-04-23 ENCOUNTER — Ambulatory Visit (INDEPENDENT_AMBULATORY_CARE_PROVIDER_SITE_OTHER): Payer: HMO | Admitting: Family Medicine

## 2022-04-23 VITALS — BP 118/62 | HR 95 | Ht 65.0 in | Wt 251.0 lb

## 2022-04-23 DIAGNOSIS — D649 Anemia, unspecified: Secondary | ICD-10-CM | POA: Diagnosis not present

## 2022-04-23 DIAGNOSIS — I872 Venous insufficiency (chronic) (peripheral): Secondary | ICD-10-CM

## 2022-04-23 DIAGNOSIS — N186 End stage renal disease: Secondary | ICD-10-CM

## 2022-04-23 DIAGNOSIS — I83029 Varicose veins of left lower extremity with ulcer of unspecified site: Secondary | ICD-10-CM

## 2022-04-23 DIAGNOSIS — F331 Major depressive disorder, recurrent, moderate: Secondary | ICD-10-CM | POA: Diagnosis not present

## 2022-04-23 DIAGNOSIS — L97929 Non-pressure chronic ulcer of unspecified part of left lower leg with unspecified severity: Secondary | ICD-10-CM

## 2022-04-23 DIAGNOSIS — N321 Vesicointestinal fistula: Secondary | ICD-10-CM | POA: Diagnosis not present

## 2022-04-23 DIAGNOSIS — L97811 Non-pressure chronic ulcer of other part of right lower leg limited to breakdown of skin: Secondary | ICD-10-CM

## 2022-04-23 MED ORDER — VORTIOXETINE HBR 10 MG PO TABS: 20.0000 mg | ORAL_TABLET | Freq: Every day | ORAL | 3 refills | Status: DC

## 2022-04-23 NOTE — Telephone Encounter (Signed)
Patient's spouse requesting list of appointments for past 3 years; needed for insurance company to reimburse her retroactively.  Please advise when list ready at [phone].

## 2022-04-23 NOTE — Progress Notes (Signed)
Subjective:   Admit date: 03/30/2022 Discharge date: 04/06/2022   Recommendations for Outpatient Follow-up:  Needs Chem-12, CBC 1 week Recommend Unna boots to stay on until clinic visit with Dr. Sharol Given in 1 week Various medications including multi antihypertensives and diuretics held-see MAR New medications of Sensipar and others added Wheelchair home health physical therapy ordered for home Recommend referral to urology for voiding cystogram in the outpatient setting   History of present illness:   71 y.o. female with medical history significant of PAF on Eliquis, CKD stage IV, CAD, chronic HFpEF, morbid obesity, IIDM, chronic venous stasis wound on right leg, recent recurrent C. difficile colitis, recurrent UTIs, presented with bleeding from the right leg venous stasis ulcer.   Hospitalization also complicated by worsening renal function with uremia.  Renal following, planning for trial of lasix and likely will need dialysis.  Vascular has been consulted for access + TDC.    Hospital Course:  Acute Kidney Injury on CKD IV or V  Uremia Baseline creatinine appears to be ~3-5 over the past year or so, recently worsening with creatinine 7.09 + asterixis, mild confusion from PTA is now resolved diuretic challenge with 120 mg lasix q8 Unfortunately, she did not respond - initiate dialysis this admission and has been clipped to [facility] T[facility] vascular surgery and underwent AV fistula and TDC placement on 11/8   Chronic HFrEF (EF 30%) - Echo 01/2022 TEE with EF 30%, RVSF moderately reduced (see report) - case discussed by Dr. Augustin Coupe with Dr. Aundra Dubin of advanced HF service who did not believe patient would benefit from inotropes and not candidate for home inotropes.   Right leg venous ulcer bleeding  Bleeding from Varicose Veins secondary to Anticoagulation therapy for Atrial Fibrillation -Eliquis being resumed cautiously on 11/11 -s/p DDAVP in setting of her uremia -appreciate orthopedic  assistance, recommending UNNA boots and prafo boot for right, bleeding appears to have subsided--dressing changes as per Ortho---she can weight-bear as tolerated and patient has been told to mobilize -left leg ulcer treated with Kerecis micro graft  -follow up with ortho Dr. Sharol Given will CC him to ensure not lost to follow-up   Acute Blood Loss Anemia Stable, but fluctuating Follow type and screen, repeat H/H Transfuse prn for Hemoglobin below 7 Will need iron studies in the outpatient setting   PAF, CHad2Vasc2>4 -Eliquis is resumed 11/11 and no further bleeding noted - Continue amiodarone 200 qd   HTN -Blood pressure borderline low, hold off home BP meds, start as needed hydralazine only for blood pressures above 150   Recurrent C. difficile colitis -Continue for Fidaxomycin -saw ID on 11/1, was prescribed fidaxomicin on 11/3 for her C difficile --completed therapy on 11/12 -She is asymptomatic and not having any diarrhea now will just complete therapies   Concern for Colovesical Fistula - discussed with urology,--requires outpatient cystogram-- will defer for now until renal function/volume status have stabilized   IDDM hyperglycemia and hypoglycemia -has insulin pump, protocol for patient administration ordered  - pump discontinued 11/7 -started on basal/bolus insulin--Sugars ranging predominantly 200 range but had low of 65-we will need adjustment with insulin pump --started Tradjenta 5 this admit -Transition back to pump on 11/11 and careful coordination in the outpatient setting   History of recurrent UTI -On chronic suppression antibiotics, but we will hold off MacroBid for now   Gout -Hold off colchicine due to AKI   Depression -continues on Trintellix 20 as per Prior meds     04/23/22 Patient is  here today with her husband for follow-up.  She is sitting in a wheelchair.  She is seeing Dr. Sharol Given weekly regarding the venous stasis ulcers on her legs.  They state that the  wound on the left heel has healed.  However she now has a new wound on the right leg.  The husband states that this is not healing.  They have an appointment to see Dr. Sharol Given on Monday.  Patient recently refilled her difcid and has started taking this.  However the hospital note stated that they wanted her to discontinue this medication after November 12.  She is also due to recheck her hemoglobin.  It had fallen substantially and I do not see where this has been rechecked since discharge from the hospital.  She is taking iron.  I do not see that she noted with completing.  She is still taking both Eliquis and aspirin.  She is due to recheck her hemoglobin along with iron level.  She denies any active bleeding coming from the wounds on her legs.  She is attending dialysis 3 times a week.  She denies any chest pain or shortness of breath although she does report profound fatigue.  She has stopped her prophylactic antibiotic Macrobid.  She was taking this for UTI prevention.  Of note on the CT scan of her abdomen pelvis 11/2 there was a question of a fistula: IMPRESSION: 1. Moderate amount of gas in the bladder is certainly suspicious for a colovesical fistula although a definite fistula is not identified. A CT scan of the pelvis with rectal contrast only may be helpful for further evaluation. 2. Small amount of free abdominal fluid and free pelvic fluid. 3. Cholelithiasis. 4. Very small right pleural effusion with minimal overlying atelectasis. 5. Diffuse and marked body wall edema. 6. Advanced vascular disease. Urology recommended a cystogram to evaluate further.  She has not had an appointment scheduled yet to see a urologist   Past Medical History:  Diagnosis Date   Acute MI ([place]) 1999; 2007   Anemia    hx   Anginal pain ([place])    Anxiety    ARF (acute renal failure) ([place]) 06/2017   [facility]   Arthritis    "generalized" (03/15/2014)   CAD (coronary artery disease)    MI in 2000  - MI  2007 - treated bare metal stent (no nuclear since then as 9/11)   Carotid artery disease (HCC)    CHF (congestive heart failure) (HCC)    Chronic diastolic heart failure (HCC)    a) ECHO (08/2013) EF 55-60% and RV function nl b) RHC (08/2013) RA 4, RV 30/5/7, PA 25/10 (16), PCWP 7, Fick CO/CI 6.3/2.7, PVR 1.5 WU, PA 61 and 66%   Daily headache    "~ every other day; since I fell in June" (03/15/2014)   Depression    Dyslipidemia    Dyspnea    Exertional shortness of breath    HTN (hypertension)    Hypothyroidism    Neuropathy    Obesity    Osteoarthritis    Peripheral neuropathy    PONV (postoperative nausea and vomiting)    RBBB (right bundle branch block)    Old   Stroke ([place])    mini strokes   Syncope    likely due to low blood sugar   Tachycardia    Sinus tachycardia   Type II diabetes mellitus (HCC)    Type II   Urinary incontinence    Venous  insufficiency    Past Surgical History:  Procedure Laterality Date   ABDOMINAL HYSTERECTOMY  1980's   AMPUTATION Right 02/24/2018   Procedure: RIGHT FOOT GREAT TOE AND 2ND TOE AMPUTATION;  Surgeon: Newt Minion, MD;  Location: [place];  Service: Orthopedics;  Laterality: Right;   AMPUTATION Right 04/30/2018   Procedure: RIGHT TRANSMETATARSAL AMPUTATION;  Surgeon: Newt Minion, MD;  Location: [place];  Service: Orthopedics;  Laterality: Right;   AV FISTULA PLACEMENT Left 04/02/2022   Procedure: LEFT ARM ARTERIOVENOUS (AV) FISTULA CREATION;  Surgeon: Serafina Mitchell, MD;  Location: [place];  Service: Vascular;  Laterality: Left;  PERIPHERAL NERVE BLOCK   BIOPSY  05/27/2020   Procedure: BIOPSY;  Surgeon: [name], DO;  [facility];  Service: Endoscopy;;   CATARACT EXTRACTION, BILATERAL Bilateral ?2013   COLONOSCOPY W/ POLYPECTOMY     COLONOSCOPY WITH PROPOFOL N/A 03/13/2019   Procedure: COLONOSCOPY WITH PROPOFOL;  Surgeon: Jerene Bears, MD;  Location: [place];  Service: Gastroenterology;  Laterality: N/A;    CORONARY ANGIOPLASTY WITH STENT PLACEMENT  1999; 2007   "1 + 1"   ERCP N/A 02/03/2022   Procedure: ENDOSCOPIC RETROGRADE CHOLANGIOPANCREATOGRAPHY (ERCP);  Surgeon: Carol Ada, MD;  Location: [place];  Service: Gastroenterology;  Laterality: N/A;   ESOPHAGOGASTRODUODENOSCOPY (EGD) WITH PROPOFOL N/A 03/13/2019   Procedure: ESOPHAGOGASTRODUODENOSCOPY (EGD) WITH PROPOFOL;  Surgeon: Jerene Bears, MD;  Location[facility] ENDOSCOPY;  Service: Gastroenterology;  Laterality: N/A;   ESOPHAGOGASTRODUODENOSCOPY (EGD) WITH PROPOFOL N/A 05/27/2020   Procedure: ESOPHAGOGASTRODUODENOSCOPY (EGD) WITH PROPOFOL;  Surgeon: [name], DO;  [facility];  Service: Endoscopy;  Laterality: N/A;   EYE SURGERY Bilateral    lazer   HEMOSTASIS CLIP PLACEMENT  03/13/2019   Procedure: HEMOSTASIS CLIP PLACEMENT;  Surgeon: Jerene Bears, MD;  Location: [place] END[facility]COPY;  Service: Gastroenterology;;   INSERTION OF DIALYSIS CATHETER Right 04/02/2022   Procedure: INSERTION OF TUNNELED DIALYSIS CATHETER;  Surgeon: Serafina Mitchell, MD;  Location: [place];  Service: Vascular;  Laterality: Right;   KNEE ARTHROSCOPY Left 10/25/2006   POLYPECTOMY  03/13/2019   Procedure: POLYPECTOMY;  Surgeon: Jerene Bears, MD;  Location: [facility] ENDOSCOPY;  Service: Gastroenterology;;   REMOVAL OF STONES  02/03/2022   Procedure: REMOVAL OF STONES;  Surgeon: Carol Ada, MD;  Location: [place];  Service: Gastroenterology;;   RIGHT HEART CATH N/A 07/24/2017   Procedure: RIGHT HEART CATH;  Surgeon: Jolaine Artist, MD;  Location: [place] CV [facility];  Service: Cardiovascular;  Laterality: N/A;   RIGHT HEART CATHETERIZATION N/A 09/22/2013   Procedure: RIGHT HEART CATH;  Surgeon: Jolaine Artist, MD;  Location: [facility] CATH LAB;  Service: Cardiovascular;  Laterality: N/A;   SHOULDER ARTHROSCOPY WITH OPEN ROTATOR CUFF REPAIR Right 03/14/2014   Procedure: RIGHT SHOULDER ARTHROSCOPY WITH BICEPS RELEASE, OPEN SUBSCAPULA REPAIR, OPEN  SUPRASPINATUS REPAIR.;  Surgeon: Meredith Pel, MD;  Location: [place];  Service: Orthopedics;  Laterality: Right;   SPHINCTEROTOMY  02/03/2022   Procedure: SPHINCTEROTOMY;  Surgeon: Carol Ada, MD;  Location: [place];  Service: Gastroenterology;;   TEE WITHOUT CARDIOVERSION N/A 02/04/2022   Procedure: TRANSESOPHAGEAL ECHOCARDIOGRAM (TEE);  Surgeon: Jolaine Artist, MD;  Location: [facility];  Service: Cardiovascular;  Laterality: N/A;   TOE AMPUTATION Right 02/24/2018   GREAT TOE AND 2ND TOE AMPUTATION   TUBAL LIGATION  1970's   Current Outpatient Medications on File Prior to Visit  Medication Sig Dispense Refill   albuterol (VENTOLIN HFA) 108 (90  Base) MCG/ACT inhaler Inhale 1-2 puffs into the lungs every 6 (six) hours as needed for wheezing or shortness of breath.     amiodarone (PACERONE) 200 MG tablet Take 1 tablet (200 mg total) by mouth daily. 90 tablet 3   apixaban (ELIQUIS) 5 MG TABS tablet Take 1 tablet (5 mg total) by mouth 2 (two) times daily. 180 tablet 3   aspirin EC 81 MG tablet Take 1 tablet (81 mg total) by mouth daily with breakfast. 30 tablet 11   blood glucose meter kit and supplies Use up to four times daily as directed. 1 each 0   Continuous Blood Gluc Sensor (FREESTYLE LIBRE 2 SENSOR) MISC      diphenoxylate-atropine (LOMOTIL) 2.5-0.025 MG tablet Take 1 tablet by mouth 4 (four) times daily as needed for diarrhea or loose stools. 30 tablet 0   ferrous sulfate 325 (65 FE) MG tablet Take 325 mg by mouth 2 (two) times daily.     fidaxomicin (DIFICID) 200 MG TABS tablet Take 1 tablet (200 mg total) by mouth 2 (two) times daily. 20 tablet 0   Fingerstix Lancets MISC Use as directed to check blood sugars as need up to 4 times daily 100 each 0   hydrALAZINE (APRESOLINE) 25 MG tablet Take 1 tablet (25 mg total) by mouth every 6 (six) hours as needed (SBP>150 or DBP>100). 90 tablet 1   Insulin Human (INSULIN PUMP) SOLN Inject 1 each into the skin 3 times daily with  meals, bedtime and 2 AM.     Insulin Pen Needle 32G X 4 MM MISC Use as directed 200 each 0   levothyroxine (SYNTHROID, LEVOTHROID) 50 MCG tablet Take 1 tablet (50 mcg total) by mouth daily before breakfast. 90 tablet 1   linagliptin (TRADJENTA) 5 MG TABS tablet Take 1 tablet (5 mg total) by mouth daily. 30 tablet 0   magnesium oxide (MAG-OX) 400 (240 Mg) MG tablet Take 1 tablet (400 mg total) by mouth daily. 90 tablet 3   OXYGEN Inhale 2 L/min into the lungs at bedtime.     pregabalin (LYRICA) 75 MG capsule Take 1 capsule (75 mg total) by mouth daily. 30 capsule 0   sevelamer carbonate (RENVELA) 800 MG tablet Take 1,600 mg by mouth 3 (three) times daily with meals.     No current facility-administered medications on file prior to visit.     Allergies  Allergen Reactions   Keflex [Cephalexin] Diarrhea   Codeine Nausea And Vomiting and Other (See Comments)   Social History   Socioeconomic History   Marital status: Married    Spouse name: Percell Miller   Number of children: 3   Years of education: 12th   Highest education level: Not on file  Occupational History    Employer: UNEMPLOYED  Tobacco Use   Smoking status: Former    Packs/day: 3.00    Years: 32.00    Total pack years: 96.00    Types: Cigarettes    Quit date: 10/24/1997    Years since quitting: 24.5   Smokeless tobacco: Never  Vaping Use   Vaping Use: Never used  Substance and Sexual Activity   Alcohol use: Not Currently    Comment: "might have 2-3 daiquiris in the summer"   Drug use: No   Sexual activity: Not Currently  Other Topics Concern   Not on file  Social History Narrative   Pt lives at home with her spouse.Caffeine Use- 3 sodas daily.   Social Determinants of Health  Financial Resource Strain: Low Risk  (08/16/2021)   Overall Financial Resource Strain (CARDIA)    Difficulty of Paying Living Expenses: Not hard at all  Food Insecurity: No Food Insecurity (04/02/2022)   Hunger Vital Sign    Worried About  Running Out of Food in the Last Year: Never true    Ran Out of Food in the Last Year: Never true  Transportation Needs: No Transportation Needs (04/11/2022)   PRAPARE - Hydrologist (Medical): No    Lack of Transportation (Non-Medical): No  Physical Activity: Inactive (08/16/2021)   Exercise Vital Sign    Days of Exercise per Week: 0 days    Minutes of Exercise per Session: 0 min  Stress: No Stress Concern Present (08/16/2021)   [place]    Feeling of Stress : Not at all  Social Connections: Moderately Isolated (08/16/2021)   Social Connection and Isolation Panel [NHANES]    Frequency of Communication with Friends and Family: More than three times a week    Frequency of Social Gatherings with Friends and Family: More than three times a week    Attends Religious Services: Never    Marine scientist or Organizations: No    Attends Archivist Meetings: Never    Marital Status: Married  Human resources officer Violence: Not At Risk (04/02/2022)   Humiliation, Afraid, Rape, and Kick questionnaire    Fear of Current or Ex-Partner: No    Emotionally Abused: No    Physically Abused: No    Sexually Abused: No     Review of Systems  Gastrointestinal:  Positive for diarrhea.       Objective:   Physical Exam Constitutional:      General: She is not in acute distress.    Appearance: Normal appearance. She is obese. She is not ill-appearing or toxic-appearing.  Cardiovascular:     Rate and Rhythm: Normal rate and regular rhythm.     Heart sounds: Normal heart sounds. No murmur heard.    No friction rub. No gallop.  Pulmonary:     Effort: Pulmonary effort is normal. No respiratory distress.     Breath sounds: Normal breath sounds. No stridor. No wheezing, rhonchi or rales.  Abdominal:     General: Bowel sounds are normal.     Palpations: Abdomen is soft.     Tenderness: There is no  abdominal tenderness.  Musculoskeletal:     Right lower leg: Edema present.     Left lower leg: Edema present.  Skin:    General: Skin is warm.  Neurological:     Mental Status: She is alert.    Patient is wearing Unna boots on each leg but the husband reapplies daily.     Assessment & Plan:  Anemia, unspecified type - Plan: CBC with Differential, Iron  Moderate episode of recurrent major depressive disorder (HCC) - Plan: vortioxetine HBr (TRINTELLIX) 10 MG TABS tablet  Vesicocolonic fistula  ESRF (end stage renal failure) (HCC)  Venous stasis ulcer of other part of right lower leg limited to breakdown of skin without varicose veins (HCC)  Venous ulcer of left leg (HCC) First regarding the dialysis, her sugars have dropped considerably and she had recent hypoglycemic episodes.  She has contacted her endocrinologist and they have adjusted her OmniPod to reduce the risk of hypoglycemia.  Second regarding the venous stasis ulcers on either leg, she is seeing her orthopedist who is managing  this and I will defer to their judgment.  Therapy regarding the vesicocolonic fistula, I will schedule the patient to meet with urology for a cystogram to determine if there is a fistula present.  Fourth regarding the anemia I will repeat a CBC today to monitor her hemoglobin along with iron level.  If the iron level is low and the hemoglobin is stable, I will give additional time for the ferrous sulfate to improve the hemoglobin.  If the iron level is normal and hemoglobin is unchanged, consider discussing erythropoietin with nephrology.  If the hemoglobin is less than 7 she will need transfusion.

## 2022-04-24 LAB — CBC WITH DIFFERENTIAL/PLATELET
Absolute Monocytes: 755 cells/uL (ref 200–950)
Basophils Absolute: 56 cells/uL (ref 0–200)
Basophils Relative: 0.5 %
Eosinophils Absolute: 22 cells/uL (ref 15–500)
Eosinophils Relative: 0.2 %
HCT: 27.5 % — ABNORMAL LOW (ref 35.0–45.0)
Hemoglobin: 7.9 g/dL — ABNORMAL LOW (ref 11.7–15.5)
Lymphs Abs: 477 cells/uL — ABNORMAL LOW (ref 850–3900)
MCH: 23.3 pg — ABNORMAL LOW (ref 27.0–33.0)
MCHC: 28.7 g/dL — ABNORMAL LOW (ref 32.0–36.0)
MCV: 81.1 fL (ref 80.0–100.0)
MPV: 11.7 fL (ref 7.5–12.5)
Monocytes Relative: 6.8 %
Neutro Abs: 9790 cells/uL — ABNORMAL HIGH (ref 1500–7800)
Neutrophils Relative %: 88.2 %
Platelets: 302 10*3/uL (ref 140–400)
RBC: 3.39 10*6/uL — ABNORMAL LOW (ref 3.80–5.10)
RDW: 15.8 % — ABNORMAL HIGH (ref 11.0–15.0)
Total Lymphocyte: 4.3 %
WBC: 11.1 10*3/uL — ABNORMAL HIGH (ref 3.8–10.8)

## 2022-04-24 LAB — IRON: Iron: <10 ug/dL — ABNORMAL LOW (ref 45–160)

## 2022-04-24 NOTE — Telephone Encounter (Signed)
I have printed from the patients Guarantor account for the past three years. Not sure if that is what she is requesting. They are up front in an envelope ready for pick up if that is what she needs.   I have left VM asking patient to return my call.

## 2022-04-27 ENCOUNTER — Encounter: Payer: Self-pay | Admitting: Orthopedic Surgery

## 2022-04-27 NOTE — Progress Notes (Signed)
Office Visit Note   Patient: Susan [name]           Date of Birth: Feb 04, 1951           MRN: [id number] Visit Date: 04/21/2022              Requested by: [name], MD [address] [place],  [place] [address] PCP: [name], MD  Chief Complaint  Patient presents with   Left Leg - Follow-up      HPI: Patient is a 71 year old woman with venous insufficiency and swelling of both lower extremities she is status post a transmetatarsal amputation.  Her husband is currently wrapping her legs daily.  She has recently started dialysis.  Assessment & Plan: Visit Diagnoses:  1. 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 ([place])   2. History of transmetatarsal amputation of right foot ([place])     Plan: Patient has her legs dependent most of the time.  Recommended elevation at home and elevation with dialysis.  Recommended continue compression and protein supplements.  Follow-Up Instructions: Return in about 1 week (around 04/28/2022).   Ortho Exam  Patient is alert, oriented, no adenopathy, well-dressed, normal affect, normal respiratory effort. Examination patient has a large venous stasis ulcers of both lower extremities.  After debridement there is good granulation tissue without ascending cellulitis patient has brawny skin color changes with pitting edema with venous and lymphatic insufficiency.  There is dermatitis of both legs.  Imaging: No results found.      Labs: Lab Results  Component Value Date   HGBA1C >15.5 (H) 01/20/2022   HGBA1C >15.5 (H) 01/19/2022   HGBA1C >15.5 (H) 01/14/2022   CRP 0.7 08/22/2020   CRP 1.5 (H) 03/13/2020   CRP 1.9 (H) 03/12/2020   LABURIC 4.7 08/09/2020   LABURIC 9.1 (H) 10/12/2018   LABURIC 9.0 (H) 03/06/2018   REPTSTATUS 02/08/2022 FINAL 02/03/2022   CULT  02/03/2022    NO GROWTH 5 DAYS Performed at [facility], [place] [address], [place],  [address]    [facility] ENTEROCOCCUS FAECIUM  02/01/2022   LABORGA ENTEROCOCCUS FAECALIS 02/01/2022     Lab Results  Component Value Date   ALBUMIN 1.8 (L) 04/07/2022   ALBUMIN 1.9 (L) 04/06/2022   ALBUMIN 2.0 (L) 04/05/2022    Lab Results  Component Value Date   MG 2.2 04/02/2022   MG 2.2 04/01/2022   MG 1.8 02/11/2022   No results found for: "VD25OH"  No results found for: "PREALBUMIN"    Latest Ref Rng & Units 04/23/2022    2:38 PM 04/07/2022   10:04 AM 04/06/2022    2:44 AM  CBC EXTENDED  WBC 3.8 - 10.8 Thousand/uL 11.1  10.1  9.8   RBC 3.80 - 5.10 Million/uL 3.39  3.03  3.13   Hemoglobin 11.7 - 15.5 g/dL 7.9  7.7  7.8   HCT 35.0 - 45.0 % 27.5  26.2  25.8   Platelets 140 - 400 Thousand/uL 302  136  129   NEUT# 1,500 - 7,800 cells/uL 9,790     Lymph# 850 - 3,900 cells/uL 477        There is no height or weight on file to calculate BMI.  Orders:  No orders of the defined types were placed in this encounter.  No orders of the defined types were placed in this encounter.    Procedures: No procedures performed  Clinical Data: No additional findings.  ROS:  All other systems negative, except as noted in the HPI. Review of Systems  Objective: Vital Signs: There were no vitals taken for this visit.  Specialty Comments:  No specialty comments available.  PMFS History: Patient Active Problem List   Diagnosis Date Noted   Unspecified protein-calorie malnutrition ([place]) 04/15/2022   Secondary hyperparathyroidism of renal origin ([place]) 04/14/2022   Coagulation defect, unspecified ([place]) 04/09/2022   Acquired absence of other left toe(s) ([place]) 04/07/2022   Allergy, unspecified, initial encounter 04/07/2022   Dependence on renal dialysis ([place]) 04/07/2022   Gout due to renal impairment, unspecified site 04/07/2022   Hypertensive heart and chronic kidney disease with heart failure and with stage 5 chronic kidney disease, or end stage renal disease ([place]) 04/07/2022   Personal history of transient ischemic  attack (TIA), and cerebral infarction without residual deficits 04/07/2022   Renal osteodystrophy 04/07/2022   Venous stasis ulcer of right calf ([place]) 03/31/2022   Fistula, colovaginal 03/26/2022   Diarrhea 03/26/2022   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   Sepsis without acute organ dysfunction ([place])    Bacteremia    Acute pancreatitis 02/01/2022   Abdominal pain 02/01/2022   SIRS (systemic inflammatory response syndrome) ([place]) 02/01/2022   Transaminitis 02/01/2022   History of anemia due to chronic kidney disease 02/01/2022   Paroxysmal atrial fibrillation ([place]) 02/01/2022   DKA (diabetic ketoacidosis) ([place]) 01/14/2022   NSTEMI (non-ST elevated myocardial infarction) ([place]) 03/05/2021   Acute renal failure superimposed on stage 4 chronic kidney disease ([place]) 08/22/2020   Hypoalbuminemia 05/25/2020   GERD (gastroesophageal reflux disease) 05/25/2020   Pressure injury of skin 05/17/2020   Type 2 diabetes mellitus with diabetic polyneuropathy, with long-term current use of insulin ([place]) 03/07/2020   Obesity, Class III, BMI 40-49.9 (morbid obesity) ([place]) 03/07/2020   Common bile duct (CBD) obstruction 05/28/2019   Benign neoplasm of ascending colon    Benign neoplasm of transverse colon    Benign neoplasm of descending colon    Benign neoplasm of sigmoid colon    Gastric polyps    Hyperkalemia 03/11/2019   Prolonged QT interval 03/11/2019   Onychomycosis 06/21/2018   Osteomyelitis of second toe of right foot ([place])    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 (HCC)    PVD (peripheral vascular disease) ([place]) 10/26/2017   E-coli UTI 07/27/2017   Hypothyroidism 07/27/2017   AKI (acute kidney injury) ([place])    [place] (pulmonary artery hypertension) ([place])    Impaired ambulation 07/19/2017   Nausea & vomiting 07/15/2017   Leg cramps 02/27/2017   Peripheral edema 01/12/2017   Diabetic neuropathy ([place]) 11/12/2016   CKD (chronic kidney disease), stage IV ([place]) 10/24/2015    Anemia 10/03/2015   Generalized anxiety disorder 10/03/2015   Insomnia 10/03/2015   Hyperglycemia due to diabetes mellitus ([place]) 06/07/2015   Chronic diastolic CHF (congestive heart failure) ([place]) 06/07/2015   Non compliance with medical treatment 04/17/2014   Rotator cuff tear 03/14/2014   Class 3 obesity ([place]) 09/23/2013   Chronic HFrEF (heart failure with reduced ejection fraction) ([place]) 06/03/2013   Hypotension 12/25/2012   Urinary incontinence    MDD (major depressive disorder) 11/12/2010   RBBB (right bundle branch block)    Coronary artery disease    Hyperlipemia 01/22/2009   Essential hypertension 01/22/2009   Past Medical History:  Diagnosis Date   Acute MI ([place]) 1999; 2007   Anemia    hx   Anginal pain (HCC)    Anxiety    ARF (acute renal  failure) ([place]) 06/2017   Enders Kidney Asso   Arthritis    "generalized" (03/15/2014)   CAD (coronary artery disease)    MI in 2000 - MI  2007 - treated bare metal stent (no nuclear since then as 9/11)   Carotid artery disease (HCC)    CHF (congestive heart failure) ([place])    Chronic diastolic heart failure (HCC)    a) ECHO (08/2013) EF 55-60% and RV function nl b) RHC (08/2013) RA 4, RV 30/5/7, PA 25/10 (16), PCWP 7, Fick CO/CI 6.3/2.7, PVR 1.5 WU, PA 61 and 66%   Daily headache    "~ every other day; since I fell in June" (03/15/2014)   Depression    Dyslipidemia    Dyspnea    Exertional shortness of breath    HTN (hypertension)    Hypothyroidism    Neuropathy    Obesity    Osteoarthritis    Peripheral neuropathy    PONV (postoperative nausea and vomiting)    RBBB (right bundle branch block)    Old   Stroke ([place])    mini strokes   Syncope    likely due to low blood sugar   Tachycardia    Sinus tachycardia   Type II diabetes mellitus (HCC)    Type II   Urinary incontinence    Venous insufficiency     Family History  Problem Relation Age of Onset   Heart attack Mother 78    Past Surgical History:  Procedure  Laterality Date   ABDOMINAL HYSTERECTOMY  1980's   AMPUTATION Right 02/24/2018   Procedure: RIGHT FOOT GREAT TOE AND 2ND TOE AMPUTATION;  Surgeon: Newt Minion, MD;  Location: [facility];  Service: Orthopedics;  Laterality: Right;   AMPUTATION Right 04/30/2018   Procedure: RIGHT TRANSMETATARSAL AMPUTATION;  Surgeon: Newt Minion, MD;  Location: [facility];  Service: Orthopedics;  Laterality: Right;   AV FISTULA PLACEMENT Left 04/02/2022   Procedure: LEFT ARM ARTERIOVENOUS (AV) FISTULA CREATION;  Surgeon: Serafina [place], MD;  Location: [facility];  Service: Vascular;  Laterality: Left;  PERIPHERAL NERVE BLOCK   BIOPSY  05/27/2020   Procedure: BIOPSY;  Surgeon: [name], DO;  [facility];  Service: Endoscopy;;   CATARACT EXTRACTION, BILATERAL Bilateral ?2013   COLONOSCOPY W/ POLYPECTOMY     COLONOSCOPY WITH PROPOFOL N/A 03/13/2019   Procedure: COLONOSCOPY WITH PROPOFOL;  Surgeon: Jerene Bears, MD;  Location: [facility];  Service: Gastroenterology;  Laterality: N/A;   CORONARY ANGIOPLASTY WITH STENT PLACEMENT  1999; 2007   "1 + 1"   ERCP N/A 02/03/2022   Procedure: ENDOSCOPIC RETROGRADE CHOLANGIOPANCREATOGRAPHY (ERCP);  Surgeon: Carol Ada, MD;  Location: [facility];  Service: Gastroenterology;  Laterality: N/A;   ESOPHAGOGASTRODUODENOSCOPY (EGD) WITH PROPOFOL N/A 03/13/2019   Procedure: ESOPHAGOGASTRODUODENOSCOPY (EGD) WITH PROPOFOL;  Surgeon: Jerene Bears, MD;  [facility];  Service: Gastroenterology;  Laterality: N/A;   ESOPHAGOGASTRODUODENOSCOPY (EGD) WITH PROPOFOL N/A 05/27/2020   Procedure: ESOPHAGOGASTRODUODENOSCOPY (EGD) WITH PROPOFOL;  Surgeon: [name], DO;  [facility];  Service: Endoscopy;  Laterality: N/A;   EYE SURGERY Bilateral    lazer   HEMOSTASIS CLIP PLACEMENT  03/13/2019   Procedure: HEMOSTASIS CLIP PLACEMENT;  Surgeon: Jerene Bears, MD;  Location: [facility];  Service: Gastroenterology;;   INSERTION OF DIALYSIS CATHETER Right  04/02/2022   Procedure: INSERTION OF TUNNELED DIALYSIS CATHETER;  Surgeon: Serafina [place], MD;  Location: [facility];  Service: Vascular;  Laterality: Right;  KNEE ARTHROSCOPY Left 10/25/2006   POLYPECTOMY  03/13/2019   Procedure: POLYPECTOMY;  Surgeon: Jerene Bears, MD;  Location: [facility] ENDOSCOPY;  Service: Gastroenterology;;   REMOVAL OF STONES  02/03/2022   Procedure: REMOVAL OF STONES;  Surgeon: Carol Ada, MD;  Location: [facility];  Service: Gastroenterology;;   RIGHT HEART CATH N/A 07/24/2017   Procedure: RIGHT HEART CATH;  Surgeon: Jolaine Artist, MD;  Location: [facility];  Service: Cardiovascular;  Laterality: N/A;   RIGHT HEART CATHETERIZATION N/A 09/22/2013   Procedure: RIGHT HEART CATH;  Surgeon: Jolaine Artist, MD;  Location: [facility] LAB;  Service: Cardiovascular;  Laterality: N/A;   SHOULDER ARTHROSCOPY WITH OPEN ROTATOR CUFF REPAIR Right 03/14/2014   Procedure: RIGHT SHOULDER ARTHROSCOPY WITH BICEPS RELEASE, OPEN SUBSCAPULA REPAIR, OPEN SUPRASPINATUS REPAIR.;  Surgeon: Meredith Pel, MD;  Location: [facility];  Service: Orthopedics;  Laterality: Right;   SPHINCTEROTOMY  02/03/2022   Procedure: SPHINCTEROTOMY;  Surgeon: Carol Ada, MD;  Location: [facility];  Service: Gastroenterology;;   TEE WITHOUT CARDIOVERSION N/A 02/04/2022   Procedure: TRANSESOPHAGEAL ECHOCARDIOGRAM (TEE);  Surgeon: Jolaine Artist, MD;  Location: [facility] ENDOSCOPY;  Service: Cardiovascular;  Laterality: N/A;   TOE AMPUTATION Right 02/24/2018   GREAT TOE AND 2ND TOE AMPUTATION   TUBAL LIGATION  1970's   Social History   Occupational History    Employer: UNEMPLOYED  Tobacco Use   Smoking status: Former    Packs/day: 3.00    Years: 32.00    Total pack years: 96.00    Types: Cigarettes    Quit date: 10/24/1997    Years since quitting: 24.5   Smokeless tobacco: Never  Vaping Use   Vaping Use: Never used  Substance and Sexual Activity   Alcohol use: Not Currently    Comment: "might  have 2-3 daiquiris in the summer"   Drug use: No   Sexual activity: Not Currently

## 2022-04-28 ENCOUNTER — Ambulatory Visit: Payer: Self-pay | Admitting: Infectious Diseases

## 2022-04-28 ENCOUNTER — Ambulatory Visit (INDEPENDENT_AMBULATORY_CARE_PROVIDER_SITE_OTHER): Payer: HMO | Admitting: Orthopedic Surgery

## 2022-04-28 DIAGNOSIS — I83019 Varicose veins of right lower extremity with ulcer of unspecified site: Secondary | ICD-10-CM

## 2022-04-28 DIAGNOSIS — M86271 Subacute osteomyelitis, right ankle and foot: Secondary | ICD-10-CM | POA: Diagnosis not present

## 2022-04-28 DIAGNOSIS — L97919 Non-pressure chronic ulcer of unspecified part of right lower leg with unspecified severity: Secondary | ICD-10-CM

## 2022-04-28 DIAGNOSIS — I83029 Varicose veins of left lower extremity with ulcer of unspecified site: Secondary | ICD-10-CM

## 2022-04-28 DIAGNOSIS — Z89431 Acquired absence of right foot: Secondary | ICD-10-CM | POA: Diagnosis not present

## 2022-04-28 DIAGNOSIS — L97929 Non-pressure chronic ulcer of unspecified part of left lower leg with unspecified severity: Secondary | ICD-10-CM

## 2022-04-28 NOTE — Telephone Encounter (Signed)
Patient has returned call and has picked up documents.

## 2022-04-29 ENCOUNTER — Encounter: Payer: Self-pay | Admitting: Orthopedic Surgery

## 2022-04-29 MED ORDER — OXYCODONE-ACETAMINOPHEN 5-325 MG PO TABS: 1.0000 | ORAL_TABLET | ORAL | 0 refills | Status: DC | PRN

## 2022-04-29 NOTE — Progress Notes (Signed)
Office Visit Note   Patient: Susan [name]           Date of Birth: Jan 31, 1951           MRN: [id number] Visit Date: 04/28/2022              Requested by: [name], MD [address] [place],  [place] [address] PCP: [name], MD  Chief Complaint  Patient presents with   Right Leg - Wound Check   Left Leg - Wound Check      HPI: Patient is a 71 year old woman who is seen in follow-up for both lower extremities.  Recently patient has started dialysis she had massive swelling of both lower extremities.  Patient has undergone Kerecis tissue graft to the venous ulcer left lower extremity and this has healed remarkably well.  Patient has progressive venous ulceration and ischemic changes to the right lower extremity.  Assessment & Plan: Visit Diagnoses:  1. 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 ([place])   2. History of transmetatarsal amputation of right foot ([place])   3. Subacute osteomyelitis, right ankle and foot ([place])     Plan: With the large necrotic ulcer to the right heel and large necrotic posterior calf ulcer on the right discussed with the patient recommendation to proceed with a transtibial amputation.  We will set this up for Friday.  Follow-Up Instructions: No follow-ups on file.   Ortho Exam  Patient is alert, oriented, no adenopathy, well-dressed, normal affect, normal respiratory effort. Examination the Doppler was used and patient has a strong biphasic dorsalis pedis and a strong monophasic posterior tibial pulse on the right.  The right calf distally has a necrotic ulcer approximately 10 cm in diameter and a black necrotic ulcer over the calcaneus with exposed bone that is 6 cm in diameter.  The heel ulcer is very painful.  Examination of the left lower extremity the venous stasis ulcer treated with Leroy Kennedy has completely healed patient does have some small abrasions proximal aspect of the left calf where the compression wrap  rolled down.  These should resolve with repeat compression.  Patient's hemoglobin A1c has been consistently greater than 15.5.  Imaging: No results found.     Labs: Lab Results  Component Value Date   HGBA1C >15.5 (H) 01/20/2022   HGBA1C >15.5 (H) 01/19/2022   HGBA1C >15.5 (H) 01/14/2022   CRP 0.7 08/22/2020   CRP 1.5 (H) 03/13/2020   CRP 1.9 (H) 03/12/2020   LABURIC 4.7 08/09/2020   LABURIC 9.1 (H) 10/12/2018   LABURIC 9.0 (H) 03/06/2018   REPTSTATUS 02/08/2022 FINAL 02/03/2022   CULT  02/03/2022    NO GROWTH 5 DAYS Performed at [facility], [place] [address] [place], [place] [address]    LABORGA ENTEROCOCCUS FAECIUM 02/01/2022   LABORGA ENTEROCOCCUS FAECALIS 02/01/2022     Lab Results  Component Value Date   ALBUMIN 1.8 (L) 04/07/2022   ALBUMIN 1.9 (L) 04/06/2022   ALBUMIN 2.0 (L) 04/05/2022    Lab Results  Component Value Date   MG 2.2 04/02/2022   MG 2.2 04/01/2022   MG 1.8 02/11/2022   No results found for: "VD25OH"  No results found for: "PREALBUMIN"    Latest Ref Rng & Units 04/23/2022    2:38 PM 04/07/2022   10:04 AM 04/06/2022    2:44 AM  CBC EXTENDED  WBC 3.8 - 10.8 Thousand/uL 11.1  10.1  9.8   RBC 3.80 -  5.10 Million/uL 3.39  3.03  3.13   Hemoglobin 11.7 - 15.5 g/dL 7.9  7.7  7.8   HCT 35.0 - 45.0 % 27.5  26.2  25.8   Platelets 140 - 400 Thousand/uL 302  136  129   NEUT# 1,500 - 7,800 cells/uL 9,790     Lymph# 850 - 3,900 cells/uL 477        There is no height or weight on file to calculate BMI.  Orders:  No orders of the defined types were placed in this encounter.  No orders of the defined types were placed in this encounter.    Procedures: No procedures performed  Clinical Data: No additional findings.  ROS:  All other systems negative, except as noted in the HPI. Review of Systems  Objective: Vital Signs: There were no vitals taken for this visit.  Specialty Comments:  No specialty comments available.  PMFS  History: Patient Active Problem List   Diagnosis Date Noted   Unspecified protein-calorie malnutrition ([place]) 04/15/2022   Secondary hyperparathyroidism of renal origin ([place]) 04/14/2022   Coagulation defect, unspecified ([place]) 04/09/2022   Acquired absence of other left toe(s) ([place]) 04/07/2022   Allergy, unspecified, initial encounter 04/07/2022   Dependence on renal dialysis ([place]) 04/07/2022   Gout due to renal impairment, unspecified site 04/07/2022   Hypertensive heart and chronic kidney disease with heart failure and with stage 5 chronic kidney disease, or end stage renal disease ([place]) 04/07/2022   Personal history of transient ischemic attack (TIA), and cerebral infarction without residual deficits 04/07/2022   Renal osteodystrophy 04/07/2022   Venous stasis ulcer of right calf ([place]) 03/31/2022   Fistula, colovaginal 03/26/2022   Diarrhea 03/26/2022   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   Sepsis without acute organ dysfunction ([place])    Bacteremia    Acute pancreatitis 02/01/2022   Abdominal pain 02/01/2022   SIRS (systemic inflammatory response syndrome) ([place]) 02/01/2022   Transaminitis 02/01/2022   History of anemia due to chronic kidney disease 02/01/2022   Paroxysmal atrial fibrillation ([place]) 02/01/2022   DKA (diabetic ketoacidosis) ([place]) 01/14/2022   NSTEMI (non-ST elevated myocardial infarction) ([place]) 03/05/2021   Acute renal failure superimposed on stage 4 chronic kidney disease ([place]) 08/22/2020   Hypoalbuminemia 05/25/2020   GERD (gastroesophageal reflux disease) 05/25/2020   Pressure injury of skin 05/17/2020   Type 2 diabetes mellitus with diabetic polyneuropathy, with long-term current use of insulin ([place]) 03/07/2020   Obesity, Class III, BMI 40-49.9 (morbid obesity) ([place]) 03/07/2020   Common bile duct (CBD) obstruction 05/28/2019   Benign neoplasm of ascending colon    Benign neoplasm of transverse colon    Benign neoplasm of descending colon    Benign neoplasm of  sigmoid colon    Gastric polyps    Hyperkalemia 03/11/2019   Prolonged QT interval 03/11/2019   Onychomycosis 06/21/2018   Osteomyelitis of second toe of right foot ([place])    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 (HCC)    PVD (peripheral vascular disease) ([place]) 10/26/2017   E-coli UTI 07/27/2017   Hypothyroidism 07/27/2017   AKI (acute kidney injury) ([place])    PAH (pulmonary artery hypertension) (HCC)    Impaired ambulation 07/19/2017   Nausea & vomiting 07/15/2017   Leg cramps 02/27/2017   Peripheral edema 01/12/2017   Diabetic neuropathy ([place]) 11/12/2016   CKD (chronic kidney disease), stage IV ([place]) 10/24/2015   Anemia 10/03/2015   Generalized anxiety disorder 10/03/2015   Insomnia 10/03/2015   Hyperglycemia due to diabetes mellitus (  [place]) 06/07/2015   Chronic diastolic CHF (congestive heart failure) ([place]) 06/07/2015   Non compliance with medical treatment 04/17/2014   Rotator cuff tear 03/14/2014   Class 3 obesity ([place]) 09/23/2013   Chronic HFrEF (heart failure with reduced ejection fraction) ([place]) 06/03/2013   Hypotension 12/25/2012   Urinary incontinence    MDD (major depressive disorder) 11/12/2010   RBBB (right bundle branch block)    Coronary artery disease    Hyperlipemia 01/22/2009   Essential hypertension 01/22/2009   Past Medical History:  Diagnosis Date   Acute MI ([place]) 1999; 2007   Anemia    hx   Anginal pain ([place])    Anxiety    ARF (acute renal failure) ([place]) 06/2017   [facility]   Arthritis    "generalized" (03/15/2014)   CAD (coronary artery disease)    MI in 2000 - MI  2007 - treated bare metal stent (no nuclear since then as 9/11)   Carotid artery disease (HCC)    CHF (congestive heart failure) ([place])    Chronic diastolic heart failure ([place])    a) ECHO (08/2013) EF 55-60% and RV function nl b) RHC (08/2013) RA 4, RV 30/5/7, PA 25/10 (16), PCWP 7, Fick CO/CI 6.3/2.7, PVR 1.5 WU, PA 61 and 66%   Daily headache    "~ every  other day; since I fell in June" (03/15/2014)   Depression    Dyslipidemia    Dyspnea    Exertional shortness of breath    HTN (hypertension)    Hypothyroidism    Neuropathy    Obesity    Osteoarthritis    Peripheral neuropathy    PONV (postoperative nausea and vomiting)    RBBB (right bundle branch block)    Old   Stroke ([place])    mini strokes   Syncope    likely due to low blood sugar   Tachycardia    Sinus tachycardia   Type II diabetes mellitus (HCC)    Type II   Urinary incontinence    Venous insufficiency     Family History  Problem Relation Age of Onset   Heart attack Mother 11    Past Surgical History:  Procedure Laterality Date   ABDOMINAL HYSTERECTOMY  1980's   AMPUTATION Right 02/24/2018   Procedure: RIGHT FOOT GREAT TOE AND 2ND TOE AMPUTATION;  Surgeon: Newt Minion, MD;  Location: [facility];  Service: Orthopedics;  Laterality: Right;   AMPUTATION Right 04/30/2018   Procedure: RIGHT TRANSMETATARSAL AMPUTATION;  Surgeon: Newt Minion, MD;  Location: [facility];  Service: Orthopedics;  Laterality: Right;   AV FISTULA PLACEMENT Left 04/02/2022   Procedure: LEFT ARM ARTERIOVENOUS (AV) FISTULA CREATION;  Surgeon: Serafina Mitchell, MD;  Location: [facility];  Service: Vascular;  Laterality: Left;  PERIPHERAL NERVE BLOCK   BIOPSY  05/27/2020   Procedure: BIOPSY;  Surgeon: [name], DO;  [facility];  Service: Endoscopy;;   CATARACT EXTRACTION, BILATERAL Bilateral ?2013   COLONOSCOPY W/ POLYPECTOMY     COLONOSCOPY WITH PROPOFOL N/A 03/13/2019   Procedure: COLONOSCOPY WITH PROPOFOL;  Surgeon: Jerene Bears, MD;  Location: [facility];  Service: Gastroenterology;  Laterality: N/A;   CORONARY ANGIOPLASTY WITH STENT PLACEMENT  1999; 2007   "1 + 1"   ERCP N/A 02/03/2022   Procedure: ENDOSCOPIC RETROGRADE CHOLANGIOPANCREATOGRAPHY (ERCP);  Surgeon: Carol Ada, MD;  Location: [facility];  Service: Gastroenterology;  Laterality: N/A;   ESOPHAGOGASTRODUODENOSCOPY  (EGD) WITH PROPOFOL N/A 03/13/2019  Procedure: ESOPHAGOGASTRODUODENOSCOPY (EGD) WITH PROPOFOL;  Surgeon: Jerene Bears, MD;  Location[facility];  Service: Gastroenterology;  Laterality: N/A;   ESOPHAGOGASTRODUODENOSCOPY (EGD) WITH PROPOFOL N/A 05/27/2020   Procedure: ESOPHAGOGASTRODUODENOSCOPY (EGD) WITH PROPOFOL;  Surgeon: [name], DO;  [facility];  Service: Endoscopy;  Laterality: N/A;   EYE SURGERY Bilateral    lazer   HEMOSTASIS CLIP PLACEMENT  03/13/2019   Procedure: HEMOSTASIS CLIP PLACEMENT;  Surgeon: Jerene Bears, MD;  Location: [facility];  Service: Gastroenterology;;   INSERTION OF DIALYSIS CATHETER Right 04/02/2022   Procedure: INSERTION OF TUNNELED DIALYSIS CATHETER;  Surgeon: Serafina Mitchell, MD;  Location: [facility];  Service: Vascular;  Laterality: Right;   KNEE ARTHROSCOPY Left 10/25/2006   POLYPECTOMY  03/13/2019   Procedure: POLYPECTOMY;  Surgeon: Jerene Bears, MD;  Location: [facility] ENDOSCOPY;  Service: Gastroenterology;;   REMOVAL OF STONES  02/03/2022   Procedure: REMOVAL OF STONES;  Surgeon: Carol Ada, MD;  Location: [facility];  Service: Gastroenterology;;   RIGHT HEART CATH N/A 07/24/2017   Procedure: RIGHT HEART CATH;  Surgeon: Jolaine Artist, MD;  Location: [facility];  Service: Cardiovascular;  Laterality: N/A;   RIGHT HEART CATHETERIZATION N/A 09/22/2013   Procedure: RIGHT HEART CATH;  Surgeon: Jolaine Artist, MD;  Location: [facility] LAB;  Service: Cardiovascular;  Laterality: N/A;   SHOULDER ARTHROSCOPY WITH OPEN ROTATOR CUFF REPAIR Right 03/14/2014   Procedure: RIGHT SHOULDER ARTHROSCOPY WITH BICEPS RELEASE, OPEN SUBSCAPULA REPAIR, OPEN SUPRASPINATUS REPAIR.;  Surgeon: Meredith Pel, MD;  Location: [facility];  Service: Orthopedics;  Laterality: Right;   SPHINCTEROTOMY  02/03/2022   Procedure: SPHINCTEROTOMY;  Surgeon: Carol Ada, MD;  Location: [facility];  Service: Gastroenterology;;   TEE WITHOUT CARDIOVERSION N/A  02/04/2022   Procedure: TRANSESOPHAGEAL ECHOCARDIOGRAM (TEE);  Surgeon: Jolaine Artist, MD;  Location[facility];  Service: Cardiovascular;  Laterality: N/A;   TOE AMPUTATION Right 02/24/2018   GREAT TOE AND 2ND TOE AMPUTATION   TUBAL LIGATION  1970's   Social History   Occupational History    Employer: UNEMPLOYED  Tobacco Use   Smoking status: Former    Packs/day: 3.00    Years: 32.00    Total pack years: 96.00    Types: Cigarettes    Quit date: 10/24/1997    Years since quitting: 24.5   Smokeless tobacco: Never  Vaping Use   Vaping Use: Never used  Substance and Sexual Activity   Alcohol use: Not Currently    Comment: "might have 2-3 daiquiris in the summer"   Drug use: No   Sexual activity: Not Currently

## 2022-05-01 ENCOUNTER — Encounter (HOSPITAL_COMMUNITY): Payer: Self-pay | Admitting: Orthopedic Surgery

## 2022-05-01 ENCOUNTER — Other Ambulatory Visit: Payer: Self-pay | Admitting: Orthopedic Surgery

## 2022-05-01 NOTE — Progress Notes (Signed)
PCP - Dr [name] Cardiologist - Dr Glori Bickers  Chest x-ray - 04/02/22 (1V) EKG - 03/30/22 Stress Test - 01/29/17 ECHO TEE - 02/04/22 Cardiac Cath - 07/24/17  ICD Pacemaker/Loop - n/a  Sleep Study -  Yes, negative study on 07/25/20 CPAP - none  Diabetes - Freestyle Libre 2  Sensor located on the Left upper arm Diabetes Coordinator Harvel Ricks was informed of surgery date and time.  They will follow up with patient during hospital stay.  Do not take Tradjenta on the morning of surgery.  Patient has an Omnipod Insulin pump with Novolin R Insulin that she can keep on since her surgery is posted for 1 hour.  If your blood sugar is less than 70 mg/dL, you will need to treat for low blood sugar: Treat a low blood sugar (less than 70 mg/dL) with  cup of clear juice (cranberry or apple), 4 glucose tablets, OR glucose gel. Recheck blood sugar in 15 minutes after treatment (to make sure it is greater than 70 mg/dL). If your blood sugar is not greater than 70 mg/dL on recheck, call [phone] for further instructions.  Blood Thinner Instructions:. Last dose of Eliquis was on 04/30/22.  Aspirin Instructions:  Last dose ASA was on 04/30/22.  Anesthesia review: Yes, Ebony Hail, PA  STOP now taking any Aspirin (unless otherwise instructed by your surgeon), Aleve, Naproxen, Ibuprofen, Motrin, Advil, Goody's, BC's, all herbal medications, fish oil, and all vitamins.   Coronavirus Screening Do you have any of the following symptoms:  Cough yes/no: No Fever (>100.67F)  yes/no: No Runny nose yes/no: No Sore throat yes/no: No Difficulty breathing/shortness of breath  yes/no: No  Have you traveled in the last 14 days and where? yes/no: No  Patient verbalized understanding of instructions that were given via phone.

## 2022-05-01 NOTE — Progress Notes (Signed)
Anesthesia Chart Review:  Case: [id number] Date/Time: 05/02/22 1107   Procedure: RIGHT BELOW KNEE AMPUTATION (Right: Knee)   Anesthesia type: Choice   Pre-op diagnosis: Gangrene Right Heel   Location: [facility] OR ROOM 05 / [place] [facility]   Surgeons: Newt Minion, MD       DISCUSSION: Patient is a 71 year old female scheduled for the above procedure. Hemodialysis initiated during November 2023 admission, s/p right IJ [facility] and first stage left basilic vein transposition AVF 04/02/22.  History includes former smoker (quit 10/24/97), post-operative N/V, HTN, dyslipidemia, CAD (inferior MI, s/p RCA stent 2000; inferior MI s/p BMS RCA 11/25/05; NSTEMI with new inferior WMA, EF 30-35% 03/05/21, medical therapy given advanced CKD), tachycardia, PAF, right BBB, syncope (related to hypoglycemia), CHF, dyspnea, home O2 (nocturnal), DM2, peripheral neuropathy, venous insufficiency, osteomyelitis (right 1st/2nd toe amputation 02/24/18, right TMA 04/30/18), TIA, CKD (HD initiated ~ 04/02/22), difficile colitis (+ 02/18/22; completed therapy 04/06/22), recurrent UTI. On 03/27/22 CT scan there was concern for colovesical fistula--referred to urology for out-patient cystogram.  - [place] admission 03/30/22-04/06/22 for acute on chronic kidney disease requiring initiation of hemodialysis, bleeding from chronic RLE venous stasis wound (Eliquis tempoarily held, given DDAVP; seen by Dr. Sharol Given and wound care), recurrent C. difficile colitis (s/p fidaxomycin per ID, completed 04/06/22. 03/27/22 CT concerning for colovesical fistula with plans for out-patient cystogram once volume status stabilized. Out-patient HD in [place] TTS arranged.  - [place] admission 01/31/22-02/11/22 for acute gallstone pancreatitis and Enterococcus bacteremia.  S/p ERCP 02/03/2022 consistent with choledocholithiasis, patient underwent biliary sphincterotomy and balloon extraction. No plans for cholecystectomy during admission due to acute infection, advanced renal  disease, and cardiac history. Hospital course complicated by acute on chronic biventricular heart failure and atrial fibrillation with RVR.  - [place] admission 12/25/21-01/23/21 for DKA (in setting on not taking medications for  3 days), possible ESBL UTI treated with meropenem, new afib and episode of sustained VT (4 minutes), K 2.8->7.5, repeat 3.9, elevated troponin likely demand ischemic insetting of VT and DKA, not considered a candidate for cardiac cath given poor renal function and questionable compliance, discharge medications included amiodarone, apixaban, ASA, Coreg, metolazone, torsemide, insulin , nocturnal O2.   Last cardiology out-patient visit noted is from 02/25/22 with [name], [place] [facility].  She was volume overloaded at that time and torsemide and metolazone increased. Rare angina (stable), on Imdur and b-blocker. No plans for LHC at that time unless ACS or persistent severe angina. She was not yet on hemodialysis.  On amiodarone and Eliquis for PAF. During her 03/30/22-04/06/22 admission, nephrology did communicate with HF cardiologist Dr. Loralie Champagne to discuss if she would benefit from inotropic therapy, but he did not believe they would help and she was not a candidate for home inotropes. Ultimately, she was required initiation of hemodialysis ~ 04/02/22 to help with volume overload.   She was evaluated by Dr. Sharol Given on 04/28/22 for follow-up BLE venous stasis wounds. He wrote, "Recently patient has started dialysis she had massive swelling of both lower extremities. Patient has undergone Kerecis tissue graft to the venous ulcer left lower extremity and this has healed remarkably well. Patient has progressive venous ulceration and ischemic changes to the right lower extremity." Right transtibial amputation recommended given large necrotic ulcers to her right heel and posterior calf.  He noted A1c was recently > 15.5%. She had labs through [facility] (see CE), and H/H  7.9/23.7% 04/29/22 which is consistent with her results from 04/23/22. T&S  ordered. I also notified Dr. Sharol Given, and will defer decision to PRBC to surgeon and/or anesthesiologist. Dr. Sharol Given did advised to have her hold Eliquis, last dose 04/30/22.   She has a FreeStyle Libre glucose monitor and an Omnipod insulin pump. Will place order for DM Coordinator.   Anesthesia team to evaluate on the day of surgery.    VS:  BP Readings from Last 3 Encounters:  04/23/22 118/62  04/07/22 (!) 137/49  03/26/22 139/69   Pulse Readings from Last 3 Encounters:  04/23/22 95  04/07/22 80  03/26/22 62     PROVIDERS: [name], MD is PCP  Madelon Lips, MD is nephrologist Glori Bickers, MD is HF cardiologist Rosiland Oz, MD is ID   LABS: Most recent lab results in [facility] include: Lab Results  Component Value Date   WBC 11.1 (H) 04/23/2022   HGB 7.9 (L) 04/23/2022   HCT 27.5 (L) 04/23/2022   PLT 302 04/23/2022   GLUCOSE 144 (H) 04/07/2022   ALT 17 04/02/2022   AST 15 04/02/2022   NA 135 04/07/2022   K 4.1 04/07/2022   CL 99 04/07/2022   CREATININE 5.29 (H) 04/07/2022   BUN 77 (H) 04/07/2022   CO2 26 04/07/2022   TSH 1.493 02/01/2022   INR 1.5 (H) 02/01/2022   HGBA1C >15.5 (H) 01/20/2022   MICROALBUR 287.8 06/29/2020  She had labs through [facility] (see CE), and H/H 7.9/23.7% 04/29/22.    Sleep Study 07/25/2020: FINDINGS: 1. No evidence of Obstructive Sleep Apnea with AHI 2.8hr.  2. No Central Sleep Apnea. 3. Oxygen desaturations as low as 86%. 4. Mild snoring was present. O2 sats were < 88% for 0.28mnutes. 5. Total sleep time was 7 hrs and 24 min. 6. 32.8% of total sleep time was spent in REM sleep. 7. Normal sleep onset latency at 20 min.  8. Shortened REM sleep onset latency at 25 min.  9. Total awakenings were 3.    IMAGES: 1V PCXR 04/02/22: IMPRESSION: 1. Right internal jugular catheter tip at the SVC RA junction. No pneumothorax. 2. Cardiomegaly.  Possible venous hypertension.   CT Abd/pelvis 03/27/22: IMPRESSION: 1. Moderate amount of gas in the bladder is certainly suspicious for a colovesical fistula although a definite fistula is not identified. A CT scan of the pelvis with rectal contrast only may be helpful for further evaluation. 2. Small amount of free abdominal fluid and free pelvic fluid. 3. Cholelithiasis. 4. Very small right pleural effusion with minimal overlying atelectasis. 5. Diffuse and marked body wall edema. 6. Advanced vascular disease. Aortic Atherosclerosis (ICD10-I70.0).   EKG: 03/30/2022:  Sinus rhythm Ventricular premature complex Right bundle branch block Confirmed by GSherwood Gambler([phone] on 03/30/2022 8:34:13 PM   CV: TEE 02/04/2022: IMPRESSIONS   1. Left ventricular ejection fraction, by estimation, is 30%. The left  ventricle has moderately decreased function.   2. Right ventricular systolic function is moderately reduced. The right  ventricular size is moderately enlarged.   3. Left atrial size was moderately dilated. No left atrial/left atrial  appendage thrombus was detected.   4. Right atrial size was severely dilated.   5. No vegetation. The mitral valve is normal in structure. Trivial mitral  valve regurgitation.   6. No vegetation. Tricuspid valve regurgitation is severe.   7. There is a small filamentous structure on the aortic side of the valve  that is likely a Lambl's excresence. No vegetation. The aortic valve is  tricuspid. There is mild calcification of the aortic  valve. Aortic valve  regurgitation is not visualized.   8. No vegetation.   9. There is Moderate (Grade III) plaque involving the descending aorta.    TTE 02/02/2022: IMPRESSIONS   1. Left ventricular ejection fraction, by estimation, is 30%. The left  ventricle has moderately decreased function. The left ventricle  demonstrates regional wall motion abnormalities (see scoring  diagram/findings for  description). The left ventricular  internal cavity size was moderately dilated. Left ventricular diastolic  parameters are indeterminate. There is the interventricular septum is  flattened in diastole ('D' shaped left ventricle), consistent with right  ventricular volume overload.   2. Right ventricular systolic function is low normal. The right  ventricular size is normal. There is mildly elevated pulmonary artery  systolic pressure. The estimated right ventricular systolic pressure is  50.0 mmHg.   3. The mitral valve is abnormal. Trivial mitral valve regurgitation.  Moderate mitral annular calcification.   4. Tricuspid valve regurgitation is severe.   5. The aortic valve is tricuspid. There is moderate calcification of the  aortic valve. There is mild thickening of the aortic valve. Aortic valve  regurgitation is not visualized.  - Comparison(s): Prior images reviewed side by side. Compared to prior;  aortic valve is more thickened and calcified (2022 full study), LV has  further dilatied, TR is severe.  - Previously 01/17/22 LVEF 30-35%; 12/10/21 LVEF 25-30%, LV akinesis basal-mid inferior wall & hypokinesis basal-mid inferoseptal wall; 03/06/21: LVEF 30-35%,; 02/08/20 LVEF 55-60%    Right heart cath 07/24/2017: Findings: RA = 17 RV = 48/15 PA = 49/11 (30) PCW = 18 Fick cardiac output/index = 7.0/3.0 PVR = 0.5 WU Ao sat = 97% PA sat = 62%, 64%   Assessment: 1. Normal left-sided pressures 2. Mild PAH with evidence of RV strain likely due to OHS/OSA 3. Normal cardiac output    Myocardial perfusion imaging 01/29/2017: Nuclear stress EF: 68%. Small defect in apical region consistent with probable soft tissue attenuation. (breast) No significant ischemia or scar. Low risk study    LHC 01/05/2006:  Coronaries of the left main was normal. The LAD was small and did not wrap the apex. There was mid-30% stenosis. There was apical 25% stenosis. First diagonal was large with mid 25-30%  stenosis. The circumflex and the AV groove was normal. There was a mid-obtuse marginal which was large and branching which had proximal 30% stenosis. Posterolateral was small and normal. The right coronary artery was dominant. There was proximal long 25% stenosis. There were two stents in the mid-segment. The first stent had long diffuse 25-30% stenosis. The second stent had in-stent mild luminal irregularities. The remainder of the vessel was free of high-grade disease including moderate-sized PDA.  LEFT VENTRICULOGRAM: The left ventriculogram was obtained in the RAO projection. The EF was 60% with normal wall motion. Occlusion residual nonobstructive coronary artery disease, well-preserved ejection fraction.     Carotid duplex 04/27/2012:  Mild atherosclerotic disease in the carotid arteries bilaterally. Estimated degree of stenosis in the internal carotid arteries is less than 50% bilaterally.    Past Medical History:  Diagnosis Date   Acute MI ([place]) 1999; 2007; 03/05/21   Anemia    hx   Anginal pain ([place])    Anxiety    ARF (acute renal failure) ([place]) 06/2017   [facility] Asso   Arthritis    "generalized" (03/15/2014)   CAD (coronary artery disease)    MI in 2000 - MI  2007 - treated bare metal stent (no  nuclear since then as 9/11)   Carotid artery disease (H[facility])    CHF (congestive heart failure) ([place])    HFrEF 03/06/21   Chronic diastolic heart failure ([place])    a) ECHO (08/2013) EF 55-60% and RV function nl b) RHC (08/2013) RA 4, RV 30/5/7, PA 25/10 (16), PCWP 7, Fick CO/CI 6.3/2.7, PVR 1.5 WU, PA 61 and 66%   Daily headache    "~ every other day; since I fell in June" (03/15/2014)   Depression    Dyslipidemia    Dyspnea    ESRD (end stage renal disease) (HCC)    Exertional shortness of breath    HTN (hypertension)    Hypothyroidism    Neuropathy    Obesity    Osteoarthritis    PAF (paroxysmal atrial fibrillation) (HCC)    Peripheral neuropathy    PONV (postoperative  nausea and vomiting)    RBBB (right bundle branch block)    Old   Stroke ([place])    mini strokes   Syncope    likely due to low blood sugar   Tachycardia    Sinus tachycardia   Type II diabetes mellitus ([place])    Type II   Urinary incontinence    Venous insufficiency     Past Surgical History:  Procedure Laterality Date   ABDOMINAL HYSTERECTOMY  1980's   AMPUTATION Right 02/24/2018   Procedure: RIGHT FOOT GREAT TOE AND 2ND TOE AMPUTATION;  Surgeon: Newt Minion, MD;  Location: [place];  Service: Orthopedics;  Laterality: Right;   AMPUTATION Right 04/30/2018   Procedure: RIGHT TRANSMETATARSAL AMPUTATION;  Surgeon: Newt Minion, MD;  Location: [place];  Service: Orthopedics;  Laterality: Right;   AV FISTULA PLACEMENT Left 04/02/2022   Procedure: LEFT ARM ARTERIOVENOUS (AV) FISTULA CREATION;  Surgeon: Serafina Mitchell, MD;  Location: [place];  Service: Vascular;  Laterality: Left;  PERIPHERAL NERVE BLOCK   BIOPSY  05/27/2020   Procedure: BIOPSY;  Surgeon: [name], DO;  [facility];  Service: Endoscopy;;   CATARACT EXTRACTION, BILATERAL Bilateral ?2013   COLONOSCOPY W/ POLYPECTOMY     COLONOSCOPY WITH PROPOFOL N/A 03/13/2019   Procedure: COLONOSCOPY WITH PROPOFOL;  Surgeon: Jerene Bears, MD;  Location: [place];  Service: Gastroenterology;  Laterality: N/A;   CORONARY ANGIOPLASTY WITH STENT PLACEMENT  1999; 2007   "1 + 1"   ERCP N/A 02/03/2022   Procedure: ENDOSCOPIC RETROGRADE CHOLANGIOPANCREATOGRAPHY (ERCP);  Surgeon: Carol Ada, MD;  Location: [place];  Service: Gastroenterology;  Laterality: N/A;   ESOPHAGOGASTRODUODENOSCOPY (EGD) WITH PROPOFOL N/A 03/13/2019   Procedure: ESOPHAGOGASTRODUODENOSCOPY (EGD) WITH PROPOFOL;  Surgeon: Jerene Bears, MD;  Location: [facility] ENDOSCOPY;  Service: Gastroenterology;  Laterality: N/A;   ESOPHAGOGASTRODUODENOSCOPY (EGD) WITH PROPOFOL N/A 05/27/2020   Procedure: ESOPHAGOGASTRODUODENOSCOPY (EGD) WITH PROPOFOL;  Surgeon: [name], DO;  [facility];  Service: Endoscopy;  Laterality: N/A;   EYE SURGERY Bilateral    lazer   HEMOSTASIS CLIP PLACEMENT  03/13/2019   Procedure: HEMOSTASIS CLIP PLACEMENT;  Surgeon: Jerene Bears, MD;  Location: [place] [facility];  Service: Gastroenterology;;   INSERTION OF DIALYSIS CATHETER Right 04/02/2022   Procedure: INSERTION OF TUNNELED DIALYSIS CATHETER;  Surgeon: Serafina Mitchell, MD;  [facility] OR;  Service: Vascular;  Laterality: Right;   KNEE ARTHROSCOPY Left 10/25/2006   POLYPECTOMY  03/13/2019   Procedure: POLYPECTOMY;  Surgeon: Jerene Bears, MD;  Location: [place];  Service: Gastroenterology;;   REMOVAL OF STONES  02/03/2022  Procedure: REMOVAL OF STONES;  Surgeon: Carol Ada, MD;  Location: [place];  Service: Gastroenterology;;   RIGHT HEART CATH N/A 07/24/2017   Procedure: RIGHT HEART CATH;  Surgeon: Jolaine Artist, MD;  Location: [place] CV LAB;  Service: Cardiovascular;  Laterality: N/A;   RIGHT HEART CATHETERIZATION N/A 09/22/2013   Procedure: RIGHT HEART CATH;  Surgeon: Jolaine Artist, MD;  Location: [facility] CATH LAB;  Service: Cardiovascular;  Laterality: N/A;   SHOULDER ARTHROSCOPY WITH OPEN ROTATOR CUFF REPAIR Right 03/14/2014   Procedure: RIGHT SHOULDER ARTHROSCOPY WITH BICEPS RELEASE, OPEN SUBSCAPULA REPAIR, OPEN SUPRASPINATUS REPAIR.;  Surgeon: Meredith Pel, MD;  Location: [place];  Service: Orthopedics;  Laterality: Right;   SPHINCTEROTOMY  02/03/2022   Procedure: SPHINCTEROTOMY;  Surgeon: Carol Ada, MD;  Location: [place];  Service: Gastroenterology;;   TEE WITHOUT CARDIOVERSION N/A 02/04/2022   Procedure: TRANSESOPHAGEAL ECHOCARDIOGRAM (TEE);  Surgeon: Jolaine Artist, MD;  Location: [facility] ENDOSCOPY;  Service: Cardiovascular;  Laterality: N/A;   TOE AMPUTATION Right 02/24/2018   GREAT TOE AND 2ND TOE AMPUTATION   TUBAL LIGATION  1970's    MEDICATIONS: No current facility-administered medications for this  encounter.    albuterol (VENTOLIN HFA) 108 (90 Base) MCG/ACT inhaler   amiodarone (PACERONE) 200 MG tablet   apixaban (ELIQUIS) 5 MG TABS tablet   aspirin EC 81 MG tablet   B Complex-C-Folic Acid (DIALYVITE TABLET) TABS   diphenoxylate-atropine (LOMOTIL) 2.5-0.025 MG tablet   ferrous sulfate 325 (65 FE) MG tablet   hydrALAZINE (APRESOLINE) 25 MG tablet   Insulin Human (INSULIN PUMP) SOLN   insulin regular (NOVOLIN R RELION) 100 units/mL injection   levothyroxine (SYNTHROID, LEVOTHROID) 50 MCG tablet   linagliptin (TRADJENTA) 5 MG TABS tablet   magnesium oxide (MAG-OX) 400 (240 Mg) MG tablet   oxyCODONE-acetaminophen (PERCOCET/ROXICET) 5-325 MG tablet   OXYGEN   pregabalin (LYRICA) 75 MG capsule   sevelamer carbonate (RENVELA) 800 MG tablet   vortioxetine HBr (TRINTELLIX) 10 MG TABS tablet   blood glucose meter kit and supplies   Continuous Blood Gluc Sensor (FREESTYLE LIBRE 2 SENSOR) MISC   fidaxomicin (DIFICID) 200 MG TABS tablet   Fingerstix Lancets MISC   Insulin Pen Needle 32G X 4 MM MISC    Myra Gianotti, PA-C Surgical Short Stay/Anesthesiology [facility] Phone [phone] [facility] Phone [phone] 05/01/2022 4:53 PM

## 2022-05-02 ENCOUNTER — Inpatient Hospital Stay (HOSPITAL_COMMUNITY): Payer: PPO | Admitting: Vascular Surgery

## 2022-05-02 ENCOUNTER — Other Ambulatory Visit: Payer: Self-pay

## 2022-05-02 ENCOUNTER — Encounter (HOSPITAL_COMMUNITY)
Admission: RE | Disposition: A | Discharge status: Rehabilitation Facility | Payer: Self-pay | Source: Ambulatory Visit | Attending: Orthopedic Surgery

## 2022-05-02 ENCOUNTER — Encounter (HOSPITAL_COMMUNITY): Payer: Self-pay | Admitting: Orthopedic Surgery

## 2022-05-02 ENCOUNTER — Inpatient Hospital Stay (HOSPITAL_COMMUNITY)
Admission: RE | Admit: 2022-05-02 | Discharge: 2022-05-12 | DRG: 239 | Disposition: A | Discharge status: Rehabilitation Facility | Payer: PPO | Source: Ambulatory Visit | Attending: Orthopedic Surgery | Admitting: Orthopedic Surgery

## 2022-05-02 ENCOUNTER — Other Ambulatory Visit (HOSPITAL_COMMUNITY): Payer: Self-pay | Admitting: Internal Medicine

## 2022-05-02 DIAGNOSIS — Z8673 Personal history of transient ischemic attack (TIA), and cerebral infarction without residual deficits: Secondary | ICD-10-CM

## 2022-05-02 DIAGNOSIS — L304 Erythema intertrigo: Secondary | ICD-10-CM | POA: Diagnosis not present

## 2022-05-02 DIAGNOSIS — I4510 Unspecified right bundle-branch block: Secondary | ICD-10-CM | POA: Diagnosis present

## 2022-05-02 DIAGNOSIS — N2581 Secondary hyperparathyroidism of renal origin: Secondary | ICD-10-CM | POA: Diagnosis present

## 2022-05-02 DIAGNOSIS — Z955 Presence of coronary angioplasty implant and graft: Secondary | ICD-10-CM

## 2022-05-02 DIAGNOSIS — I480 Paroxysmal atrial fibrillation: Secondary | ICD-10-CM | POA: Diagnosis present

## 2022-05-02 DIAGNOSIS — I96 Gangrene, not elsewhere classified: Secondary | ICD-10-CM | POA: Diagnosis not present

## 2022-05-02 DIAGNOSIS — Y92239 Unspecified place in hospital as the place of occurrence of the external cause: Secondary | ICD-10-CM | POA: Diagnosis not present

## 2022-05-02 DIAGNOSIS — E039 Hypothyroidism, unspecified: Secondary | ICD-10-CM | POA: Diagnosis present

## 2022-05-02 DIAGNOSIS — Z992 Dependence on renal dialysis: Secondary | ICD-10-CM | POA: Diagnosis not present

## 2022-05-02 DIAGNOSIS — M1990 Unspecified osteoarthritis, unspecified site: Secondary | ICD-10-CM | POA: Diagnosis present

## 2022-05-02 DIAGNOSIS — E11621 Type 2 diabetes mellitus with foot ulcer: Secondary | ICD-10-CM | POA: Diagnosis present

## 2022-05-02 DIAGNOSIS — Z881 Allergy status to other antibiotic agents status: Secondary | ICD-10-CM

## 2022-05-02 DIAGNOSIS — I509 Heart failure, unspecified: Secondary | ICD-10-CM

## 2022-05-02 DIAGNOSIS — T827XXA Infection and inflammatory reaction due to other cardiac and vascular devices, implants and grafts, initial encounter: Secondary | ICD-10-CM | POA: Diagnosis not present

## 2022-05-02 DIAGNOSIS — Z7984 Long term (current) use of oral hypoglycemic drugs: Secondary | ICD-10-CM

## 2022-05-02 DIAGNOSIS — I5082 Biventricular heart failure: Secondary | ICD-10-CM | POA: Diagnosis present

## 2022-05-02 DIAGNOSIS — D62 Acute posthemorrhagic anemia: Secondary | ICD-10-CM | POA: Diagnosis not present

## 2022-05-02 DIAGNOSIS — I8393 Asymptomatic varicose veins of bilateral lower extremities: Secondary | ICD-10-CM | POA: Diagnosis present

## 2022-05-02 DIAGNOSIS — Z90710 Acquired absence of both cervix and uterus: Secondary | ICD-10-CM

## 2022-05-02 DIAGNOSIS — Z885 Allergy status to narcotic agent status: Secondary | ICD-10-CM

## 2022-05-02 DIAGNOSIS — Z89421 Acquired absence of other right toe(s): Secondary | ICD-10-CM

## 2022-05-02 DIAGNOSIS — Z794 Long term (current) use of insulin: Secondary | ICD-10-CM

## 2022-05-02 DIAGNOSIS — E1310 Other specified diabetes mellitus with ketoacidosis without coma: Principal | ICD-10-CM

## 2022-05-02 DIAGNOSIS — Z87442 Personal history of urinary calculi: Secondary | ICD-10-CM

## 2022-05-02 DIAGNOSIS — Z7901 Long term (current) use of anticoagulants: Secondary | ICD-10-CM

## 2022-05-02 DIAGNOSIS — F32A Depression, unspecified: Secondary | ICD-10-CM | POA: Diagnosis present

## 2022-05-02 DIAGNOSIS — Z87891 Personal history of nicotine dependence: Secondary | ICD-10-CM

## 2022-05-02 DIAGNOSIS — Z9851 Tubal ligation status: Secondary | ICD-10-CM

## 2022-05-02 DIAGNOSIS — N184 Chronic kidney disease, stage 4 (severe): Secondary | ICD-10-CM

## 2022-05-02 DIAGNOSIS — N179 Acute kidney failure, unspecified: Secondary | ICD-10-CM

## 2022-05-02 DIAGNOSIS — L97413 Non-pressure chronic ulcer of right heel and midfoot with necrosis of muscle: Secondary | ICD-10-CM | POA: Diagnosis present

## 2022-05-02 DIAGNOSIS — Z01818 Encounter for other preprocedural examination: Secondary | ICD-10-CM

## 2022-05-02 DIAGNOSIS — Y832 Surgical operation with anastomosis, bypass or graft as the cause of abnormal reaction of the patient, or of later complication, without mention of misadventure at the time of the procedure: Secondary | ICD-10-CM | POA: Diagnosis not present

## 2022-05-02 DIAGNOSIS — I2510 Atherosclerotic heart disease of native coronary artery without angina pectoris: Secondary | ICD-10-CM | POA: Diagnosis not present

## 2022-05-02 DIAGNOSIS — M86271 Subacute osteomyelitis, right ankle and foot: Secondary | ICD-10-CM | POA: Diagnosis present

## 2022-05-02 DIAGNOSIS — Z79899 Other long term (current) drug therapy: Secondary | ICD-10-CM

## 2022-05-02 DIAGNOSIS — I252 Old myocardial infarction: Secondary | ICD-10-CM

## 2022-05-02 DIAGNOSIS — Z9841 Cataract extraction status, right eye: Secondary | ICD-10-CM

## 2022-05-02 DIAGNOSIS — E876 Hypokalemia: Secondary | ICD-10-CM | POA: Diagnosis present

## 2022-05-02 DIAGNOSIS — I878 Other specified disorders of veins: Secondary | ICD-10-CM | POA: Diagnosis present

## 2022-05-02 DIAGNOSIS — Z87440 Personal history of urinary (tract) infections: Secondary | ICD-10-CM

## 2022-05-02 DIAGNOSIS — D72829 Elevated white blood cell count, unspecified: Secondary | ICD-10-CM | POA: Diagnosis not present

## 2022-05-02 DIAGNOSIS — L97214 Non-pressure chronic ulcer of right calf with necrosis of bone: Secondary | ICD-10-CM | POA: Diagnosis present

## 2022-05-02 DIAGNOSIS — E785 Hyperlipidemia, unspecified: Secondary | ICD-10-CM | POA: Diagnosis present

## 2022-05-02 DIAGNOSIS — L7634 Postprocedural seroma of skin and subcutaneous tissue following other procedure: Secondary | ICD-10-CM | POA: Diagnosis not present

## 2022-05-02 DIAGNOSIS — E662 Morbid (severe) obesity with alveolar hypoventilation: Secondary | ICD-10-CM | POA: Diagnosis present

## 2022-05-02 DIAGNOSIS — Z89511 Acquired absence of right leg below knee: Secondary | ICD-10-CM

## 2022-05-02 DIAGNOSIS — N186 End stage renal disease: Secondary | ICD-10-CM | POA: Diagnosis present

## 2022-05-02 DIAGNOSIS — Z87448 Personal history of other diseases of urinary system: Secondary | ICD-10-CM

## 2022-05-02 DIAGNOSIS — Z6841 Body Mass Index (BMI) 40.0 and over, adult: Secondary | ICD-10-CM | POA: Diagnosis not present

## 2022-05-02 DIAGNOSIS — E1022 Type 1 diabetes mellitus with diabetic chronic kidney disease: Secondary | ICD-10-CM | POA: Diagnosis not present

## 2022-05-02 DIAGNOSIS — Z8249 Family history of ischemic heart disease and other diseases of the circulatory system: Secondary | ICD-10-CM

## 2022-05-02 DIAGNOSIS — I25118 Atherosclerotic heart disease of native coronary artery with other forms of angina pectoris: Secondary | ICD-10-CM | POA: Diagnosis present

## 2022-05-02 DIAGNOSIS — T82898A Other specified complication of vascular prosthetic devices, implants and grafts, initial encounter: Secondary | ICD-10-CM | POA: Diagnosis not present

## 2022-05-02 DIAGNOSIS — E1122 Type 2 diabetes mellitus with diabetic chronic kidney disease: Secondary | ICD-10-CM | POA: Diagnosis present

## 2022-05-02 DIAGNOSIS — Z7982 Long term (current) use of aspirin: Secondary | ICD-10-CM

## 2022-05-02 DIAGNOSIS — Z560 Unemployment, unspecified: Secondary | ICD-10-CM

## 2022-05-02 DIAGNOSIS — Z9842 Cataract extraction status, left eye: Secondary | ICD-10-CM

## 2022-05-02 DIAGNOSIS — Z9181 History of falling: Secondary | ICD-10-CM

## 2022-05-02 DIAGNOSIS — E1052 Type 1 diabetes mellitus with diabetic peripheral angiopathy with gangrene: Secondary | ICD-10-CM | POA: Diagnosis not present

## 2022-05-02 DIAGNOSIS — K219 Gastro-esophageal reflux disease without esophagitis: Secondary | ICD-10-CM | POA: Diagnosis present

## 2022-05-02 DIAGNOSIS — D649 Anemia, unspecified: Secondary | ICD-10-CM

## 2022-05-02 DIAGNOSIS — E1169 Type 2 diabetes mellitus with other specified complication: Secondary | ICD-10-CM | POA: Diagnosis present

## 2022-05-02 DIAGNOSIS — E1142 Type 2 diabetes mellitus with diabetic polyneuropathy: Secondary | ICD-10-CM | POA: Diagnosis present

## 2022-05-02 DIAGNOSIS — I5042 Chronic combined systolic (congestive) and diastolic (congestive) heart failure: Secondary | ICD-10-CM | POA: Diagnosis present

## 2022-05-02 DIAGNOSIS — D631 Anemia in chronic kidney disease: Secondary | ICD-10-CM | POA: Diagnosis not present

## 2022-05-02 DIAGNOSIS — E1152 Type 2 diabetes mellitus with diabetic peripheral angiopathy with gangrene: Secondary | ICD-10-CM | POA: Diagnosis present

## 2022-05-02 DIAGNOSIS — Z66 Do not resuscitate: Secondary | ICD-10-CM | POA: Diagnosis present

## 2022-05-02 DIAGNOSIS — I872 Venous insufficiency (chronic) (peripheral): Secondary | ICD-10-CM | POA: Diagnosis present

## 2022-05-02 DIAGNOSIS — F419 Anxiety disorder, unspecified: Secondary | ICD-10-CM | POA: Diagnosis present

## 2022-05-02 DIAGNOSIS — I25119 Atherosclerotic heart disease of native coronary artery with unspecified angina pectoris: Secondary | ICD-10-CM | POA: Diagnosis not present

## 2022-05-02 DIAGNOSIS — E1165 Type 2 diabetes mellitus with hyperglycemia: Secondary | ICD-10-CM | POA: Diagnosis present

## 2022-05-02 DIAGNOSIS — N185 Chronic kidney disease, stage 5: Secondary | ICD-10-CM | POA: Diagnosis not present

## 2022-05-02 DIAGNOSIS — Z89411 Acquired absence of right great toe: Secondary | ICD-10-CM

## 2022-05-02 DIAGNOSIS — L02611 Cutaneous abscess of right foot: Secondary | ICD-10-CM | POA: Diagnosis present

## 2022-05-02 DIAGNOSIS — Z79890 Hormone replacement therapy (postmenopausal): Secondary | ICD-10-CM

## 2022-05-02 DIAGNOSIS — I132 Hypertensive heart and chronic kidney disease with heart failure and with stage 5 chronic kidney disease, or end stage renal disease: Secondary | ICD-10-CM | POA: Diagnosis present

## 2022-05-02 DIAGNOSIS — Z86010 Personal history of colonic polyps: Secondary | ICD-10-CM

## 2022-05-02 DIAGNOSIS — M898X9 Other specified disorders of bone, unspecified site: Secondary | ICD-10-CM | POA: Diagnosis present

## 2022-05-02 HISTORY — PX: AMPUTATION: SHX166

## 2022-05-02 HISTORY — DX: Paroxysmal atrial fibrillation: I48.0

## 2022-05-02 HISTORY — DX: End stage renal disease: N18.6

## 2022-05-02 HISTORY — DX: Personal history of urinary calculi: Z87.442

## 2022-05-02 LAB — CBC WITH DIFFERENTIAL/PLATELET
Abs Immature Granulocytes: 0.12 10*3/uL — ABNORMAL HIGH (ref 0.00–0.07)
Basophils Absolute: 0 10*3/uL (ref 0.0–0.1)
Basophils Relative: 0 %
Eosinophils Absolute: 0.3 10*3/uL (ref 0.0–0.5)
Eosinophils Relative: 2 %
HCT: 30.1 % — ABNORMAL LOW (ref 36.0–46.0)
Hemoglobin: 8.7 g/dL — ABNORMAL LOW (ref 12.0–15.0)
Immature Granulocytes: 1 %
Lymphocytes Relative: 6 %
Lymphs Abs: 0.7 10*3/uL (ref 0.7–4.0)
MCH: 23.5 pg — ABNORMAL LOW (ref 26.0–34.0)
MCHC: 28.9 g/dL — ABNORMAL LOW (ref 30.0–36.0)
MCV: 81.4 fL (ref 80.0–100.0)
Monocytes Absolute: 0.7 10*3/uL (ref 0.1–1.0)
Monocytes Relative: 6 %
Neutro Abs: 10 10*3/uL — ABNORMAL HIGH (ref 1.7–7.7)
Neutrophils Relative %: 85 %
Platelets: 344 10*3/uL (ref 150–400)
RBC: 3.7 MIL/uL — ABNORMAL LOW (ref 3.87–5.11)
RDW: 18.6 % — ABNORMAL HIGH (ref 11.5–15.5)
WBC: 11.9 10*3/uL — ABNORMAL HIGH (ref 4.0–10.5)
nRBC: 0 % (ref 0.0–0.2)

## 2022-05-02 LAB — POCT I-STAT, CHEM 8
BUN: 30 mg/dL — ABNORMAL HIGH (ref 8–23)
Calcium, Ion: 1.04 mmol/L — ABNORMAL LOW (ref 1.15–1.40)
Chloride: 94 mmol/L — ABNORMAL LOW (ref 98–111)
Creatinine, Ser: 3 mg/dL — ABNORMAL HIGH (ref 0.44–1.00)
Glucose, Bld: 290 mg/dL — ABNORMAL HIGH (ref 70–99)
HCT: 30 % — ABNORMAL LOW (ref 36.0–46.0)
Hemoglobin: 10.2 g/dL — ABNORMAL LOW (ref 12.0–15.0)
Potassium: 2.9 mmol/L — ABNORMAL LOW (ref 3.5–5.1)
Sodium: 134 mmol/L — ABNORMAL LOW (ref 135–145)
TCO2: 30 mmol/L (ref 22–32)

## 2022-05-02 LAB — GLUCOSE, CAPILLARY
Glucose-Capillary: 267 mg/dL — ABNORMAL HIGH (ref 70–99)
Glucose-Capillary: 268 mg/dL — ABNORMAL HIGH (ref 70–99)
Glucose-Capillary: 219 mg/dL — ABNORMAL HIGH (ref 70–99)
Glucose-Capillary: 153 mg/dL — ABNORMAL HIGH (ref 70–99)
Glucose-Capillary: 131 mg/dL — ABNORMAL HIGH (ref 70–99)

## 2022-05-02 LAB — COMPREHENSIVE METABOLIC PANEL
ALT: 10 U/L (ref 0–44)
AST: 12 U/L — ABNORMAL LOW (ref 15–41)
Albumin: 2 g/dL — ABNORMAL LOW (ref 3.5–5.0)
Alkaline Phosphatase: 162 U/L — ABNORMAL HIGH (ref 38–126)
Anion gap: 11 (ref 5–15)
BUN: 28 mg/dL — ABNORMAL HIGH (ref 8–23)
CO2: 30 mmol/L (ref 22–32)
Calcium: 8.4 mg/dL — ABNORMAL LOW (ref 8.9–10.3)
Chloride: 93 mmol/L — ABNORMAL LOW (ref 98–111)
Creatinine, Ser: 2.93 mg/dL — ABNORMAL HIGH (ref 0.44–1.00)
GFR, Estimated: 16 mL/min — ABNORMAL LOW (ref >60)
Glucose, Bld: 282 mg/dL — ABNORMAL HIGH (ref 70–99)
Potassium: 2.9 mmol/L — ABNORMAL LOW (ref 3.5–5.1)
Sodium: 134 mmol/L — ABNORMAL LOW (ref 135–145)
Total Bilirubin: 0.4 mg/dL (ref 0.3–1.2)
Total Protein: 5.9 g/dL — ABNORMAL LOW (ref 6.5–8.1)

## 2022-05-02 LAB — PREPARE RBC (CROSSMATCH)

## 2022-05-02 LAB — HEMOGLOBIN A1C
Hgb A1c MFr Bld: 8.9 % — ABNORMAL HIGH (ref 4.8–5.6)
Mean Plasma Glucose: 209 mg/dL

## 2022-05-02 LAB — PREALBUMIN: Prealbumin: 13 mg/dL — ABNORMAL LOW (ref 18–38)

## 2022-05-02 LAB — VITAMIN D 25 HYDROXY (VIT D DEFICIENCY, FRACTURES): Vit D, 25-Hydroxy: 42.14 ng/mL (ref 30–100)

## 2022-05-02 SURGERY — AMPUTATION BELOW KNEE
Anesthesia: Monitor Anesthesia Care | Site: Knee | Laterality: Right

## 2022-05-02 MED ORDER — ALUM & MAG HYDROXIDE-SIMETH 200-200-20 MG/5ML PO SUSP
15.0000 mL | ORAL | Status: DC | PRN
  Filled 2022-05-02: qty 30

## 2022-05-02 MED ORDER — MIDAZOLAM HCL 2 MG/2ML IJ SOLN
INTRAMUSCULAR | Status: AC
  Filled 2022-05-02: qty 2

## 2022-05-02 MED ORDER — VITAMIN C 500 MG PO TABS
1000.0000 mg | ORAL_TABLET | Freq: Every day | ORAL | Status: DC
  Administered 2022-05-03 – 2022-05-12 (×8): 1000 mg via ORAL
  Filled 2022-05-02 (×10): qty 2

## 2022-05-02 MED ORDER — GUAIFENESIN-DM 100-10 MG/5ML PO SYRP: 15.0000 mL | ORAL_SOLUTION | ORAL | Status: DC | PRN

## 2022-05-02 MED ORDER — PROPOFOL 500 MG/50ML IV EMUL
INTRAVENOUS | Status: DC | PRN
  Administered 2022-05-02: 50 ug/kg/min via INTRAVENOUS

## 2022-05-02 MED ORDER — INSULIN ASPART 100 UNIT/ML IJ SOLN: 4.0000 [IU] | Freq: Three times a day (TID) | INTRAMUSCULAR | Status: DC

## 2022-05-02 MED ORDER — ASPIRIN 81 MG PO TBEC
81.0000 mg | DELAYED_RELEASE_TABLET | Freq: Every day | ORAL | Status: DC
  Administered 2022-05-03 – 2022-05-12 (×9): 81 mg via ORAL
  Filled 2022-05-02 (×9): qty 1

## 2022-05-02 MED ORDER — AMISULPRIDE (ANTIEMETIC) 5 MG/2ML IV SOLN
INTRAVENOUS | Status: AC
  Filled 2022-05-02: qty 2

## 2022-05-02 MED ORDER — CHLORHEXIDINE GLUCONATE 0.12 % MT SOLN
15.0000 mL | Freq: Once | OROMUCOSAL | Status: AC
  Administered 2022-05-02: 15 mL via OROMUCOSAL
  Filled 2022-05-02: qty 15

## 2022-05-02 MED ORDER — INSULIN ASPART 100 UNIT/ML IJ SOLN
8.0000 [IU] | Freq: Once | INTRAMUSCULAR | Status: AC
  Administered 2022-05-02: 8 [IU] via SUBCUTANEOUS

## 2022-05-02 MED ORDER — INSULIN ASPART 100 UNIT/ML IJ SOLN
0.0000 [IU] | Freq: Three times a day (TID) | INTRAMUSCULAR | Status: DC
  Administered 2022-05-02 – 2022-05-03 (×2): 2 [IU] via SUBCUTANEOUS
  Administered 2022-05-05 (×2): 3 [IU] via SUBCUTANEOUS
  Administered 2022-05-06: 2 [IU] via SUBCUTANEOUS
  Administered 2022-05-06: 5 [IU] via SUBCUTANEOUS
  Administered 2022-05-06: 1 [IU] via SUBCUTANEOUS
  Administered 2022-05-07: 2 [IU] via SUBCUTANEOUS
  Administered 2022-05-07 (×2): 5 [IU] via SUBCUTANEOUS
  Administered 2022-05-08 (×2): 9 [IU] via SUBCUTANEOUS
  Administered 2022-05-09: 7 [IU] via SUBCUTANEOUS
  Administered 2022-05-09: 3 [IU] via SUBCUTANEOUS
  Administered 2022-05-09 – 2022-05-10 (×3): 7 [IU] via SUBCUTANEOUS
  Administered 2022-05-11: 5 [IU] via SUBCUTANEOUS
  Administered 2022-05-11: 3 [IU] via SUBCUTANEOUS
  Administered 2022-05-11: 5 [IU] via SUBCUTANEOUS
  Administered 2022-05-12 (×2): 2 [IU] via SUBCUTANEOUS
  Administered 2022-05-12: 3 [IU] via SUBCUTANEOUS

## 2022-05-02 MED ORDER — APIXABAN 5 MG PO TABS
5.0000 mg | ORAL_TABLET | Freq: Two times a day (BID) | ORAL | Status: DC
  Administered 2022-05-02 – 2022-05-04 (×4): 5 mg via ORAL
  Filled 2022-05-02 (×4): qty 1

## 2022-05-02 MED ORDER — FENTANYL CITRATE (PF) 100 MCG/2ML IJ SOLN
INTRAMUSCULAR | Status: AC
  Administered 2022-05-02: 50 ug via INTRAVENOUS
  Filled 2022-05-02: qty 2

## 2022-05-02 MED ORDER — LINAGLIPTIN 5 MG PO TABS
5.0000 mg | ORAL_TABLET | Freq: Every day | ORAL | Status: DC
  Administered 2022-05-02 – 2022-05-12 (×9): 5 mg via ORAL
  Filled 2022-05-02 (×10): qty 1

## 2022-05-02 MED ORDER — BISACODYL 5 MG PO TBEC
5.0000 mg | DELAYED_RELEASE_TABLET | Freq: Every day | ORAL | Status: DC | PRN
  Administered 2022-05-06: 5 mg via ORAL
  Filled 2022-05-02: qty 1

## 2022-05-02 MED ORDER — PHENYLEPHRINE HCL-NACL 20-0.9 MG/250ML-% IV SOLN
INTRAVENOUS | Status: DC | PRN
  Administered 2022-05-02: 30 ug/min via INTRAVENOUS

## 2022-05-02 MED ORDER — LABETALOL HCL 5 MG/ML IV SOLN: 10.0000 mg | INTRAVENOUS | Status: DC | PRN

## 2022-05-02 MED ORDER — HYDRALAZINE HCL 25 MG PO TABS: 25.0000 mg | ORAL_TABLET | Freq: Four times a day (QID) | ORAL | Status: DC | PRN

## 2022-05-02 MED ORDER — POTASSIUM CHLORIDE 10 MEQ/100ML IV SOLN
10.0000 meq | INTRAVENOUS | Status: AC
  Administered 2022-05-02 (×2): 10 meq via INTRAVENOUS
  Filled 2022-05-02: qty 100

## 2022-05-02 MED ORDER — SODIUM CHLORIDE 0.9 % IV SOLN: INTRAVENOUS | Status: DC

## 2022-05-02 MED ORDER — 0.9 % SODIUM CHLORIDE (POUR BTL) OPTIME
TOPICAL | Status: DC | PRN
  Administered 2022-05-02: 1000 mL

## 2022-05-02 MED ORDER — CEFAZOLIN SODIUM-DEXTROSE 2-4 GM/100ML-% IV SOLN
2.0000 g | INTRAVENOUS | Status: AC
  Administered 2022-05-02: 2 g via INTRAVENOUS
  Filled 2022-05-02: qty 100

## 2022-05-02 MED ORDER — JUVEN PO PACK
1.0000 | PACK | Freq: Two times a day (BID) | ORAL | Status: DC
  Administered 2022-05-02 – 2022-05-12 (×17): 1 via ORAL
  Filled 2022-05-02 (×18): qty 1

## 2022-05-02 MED ORDER — BUPIVACAINE-EPINEPHRINE (PF) 0.5% -1:200000 IJ SOLN
INTRAMUSCULAR | Status: DC | PRN
  Administered 2022-05-02: 15 mL via PERINEURAL
  Administered 2022-05-02: 30 mL via PERINEURAL

## 2022-05-02 MED ORDER — HYDRALAZINE HCL 20 MG/ML IJ SOLN: 5.0000 mg | INTRAMUSCULAR | Status: DC | PRN

## 2022-05-02 MED ORDER — ONDANSETRON HCL 4 MG/2ML IJ SOLN
INTRAMUSCULAR | Status: DC | PRN
  Administered 2022-05-02: 4 mg via INTRAVENOUS

## 2022-05-02 MED ORDER — MAGNESIUM SULFATE 2 GM/50ML IV SOLN
2.0000 g | Freq: Every day | INTRAVENOUS | Status: AC | PRN
  Administered 2022-05-02: 2 g via INTRAVENOUS
  Filled 2022-05-02: qty 50

## 2022-05-02 MED ORDER — CEFAZOLIN SODIUM-DEXTROSE 2-4 GM/100ML-% IV SOLN
2.0000 g | Freq: Three times a day (TID) | INTRAVENOUS | Status: AC
  Administered 2022-05-02 – 2022-05-03 (×2): 2 g via INTRAVENOUS
  Filled 2022-05-02 (×2): qty 100

## 2022-05-02 MED ORDER — INSULIN ASPART 100 UNIT/ML IJ SOLN
0.0000 [IU] | Freq: Every day | INTRAMUSCULAR | Status: DC
  Administered 2022-05-05 – 2022-05-08 (×2): 2 [IU] via SUBCUTANEOUS
  Administered 2022-05-09 – 2022-05-10 (×2): 3 [IU] via SUBCUTANEOUS

## 2022-05-02 MED ORDER — DOCUSATE SODIUM 100 MG PO CAPS
100.0000 mg | ORAL_CAPSULE | Freq: Every day | ORAL | Status: DC
  Administered 2022-05-03 – 2022-05-08 (×5): 100 mg via ORAL
  Filled 2022-05-02 (×9): qty 1

## 2022-05-02 MED ORDER — PREGABALIN 75 MG PO CAPS
75.0000 mg | ORAL_CAPSULE | Freq: Every day | ORAL | Status: DC
  Administered 2022-05-02 – 2022-05-12 (×9): 75 mg via ORAL
  Filled 2022-05-02 (×10): qty 1

## 2022-05-02 MED ORDER — OXYCODONE HCL 5 MG PO TABS
10.0000 mg | ORAL_TABLET | ORAL | Status: DC | PRN
  Administered 2022-05-02 – 2022-05-04 (×5): 15 mg via ORAL
  Administered 2022-05-05 – 2022-05-06 (×3): 10 mg via ORAL
  Administered 2022-05-08 (×2): 15 mg via ORAL
  Administered 2022-05-11: 10 mg via ORAL
  Filled 2022-05-02 (×3): qty 2
  Filled 2022-05-02 (×2): qty 3
  Filled 2022-05-02: qty 2
  Filled 2022-05-02 (×4): qty 3
  Filled 2022-05-02 (×3): qty 2
  Filled 2022-05-02: qty 3

## 2022-05-02 MED ORDER — KETAMINE HCL 50 MG/5ML IJ SOSY
PREFILLED_SYRINGE | INTRAMUSCULAR | Status: AC
  Filled 2022-05-02: qty 5

## 2022-05-02 MED ORDER — PROPOFOL 10 MG/ML IV BOLUS
INTRAVENOUS | Status: DC | PRN
  Administered 2022-05-02 (×2): 20 mg via INTRAVENOUS

## 2022-05-02 MED ORDER — FIDAXOMICIN 200 MG PO TABS: 200.0000 mg | ORAL_TABLET | Freq: Two times a day (BID) | ORAL | Status: DC

## 2022-05-02 MED ORDER — LACTATED RINGERS IV SOLN: INTRAVENOUS | Status: DC

## 2022-05-02 MED ORDER — MAGNESIUM CITRATE PO SOLN: 1.0000 | Freq: Once | ORAL | Status: DC | PRN

## 2022-05-02 MED ORDER — MIDAZOLAM HCL 2 MG/2ML IJ SOLN
INTRAMUSCULAR | Status: DC | PRN
  Administered 2022-05-02: 1 mg via INTRAVENOUS

## 2022-05-02 MED ORDER — INSULIN ASPART 100 UNIT/ML IJ SOLN
3.0000 [IU] | Freq: Three times a day (TID) | INTRAMUSCULAR | Status: DC
  Administered 2022-05-02 – 2022-05-12 (×22): 3 [IU] via SUBCUTANEOUS

## 2022-05-02 MED ORDER — PANTOPRAZOLE SODIUM 40 MG PO TBEC
40.0000 mg | DELAYED_RELEASE_TABLET | Freq: Every day | ORAL | Status: DC
  Administered 2022-05-03 – 2022-05-12 (×8): 40 mg via ORAL
  Filled 2022-05-02 (×9): qty 1

## 2022-05-02 MED ORDER — METOPROLOL TARTRATE 5 MG/5ML IV SOLN: 2.0000 mg | INTRAVENOUS | Status: DC | PRN

## 2022-05-02 MED ORDER — POTASSIUM CHLORIDE CRYS ER 20 MEQ PO TBCR
20.0000 meq | EXTENDED_RELEASE_TABLET | Freq: Every day | ORAL | Status: AC | PRN
  Administered 2022-05-03: 20 meq via ORAL
  Filled 2022-05-02: qty 1

## 2022-05-02 MED ORDER — OXYCODONE HCL 5 MG PO TABS
5.0000 mg | ORAL_TABLET | ORAL | Status: DC | PRN
  Administered 2022-05-02 – 2022-05-11 (×9): 10 mg via ORAL
  Filled 2022-05-02 (×7): qty 2

## 2022-05-02 MED ORDER — FENTANYL CITRATE (PF) 100 MCG/2ML IJ SOLN: 50.0000 ug | Freq: Once | INTRAMUSCULAR | Status: AC

## 2022-05-02 MED ORDER — PHENOL 1.4 % MT LIQD: 1.0000 | OROMUCOSAL | Status: DC | PRN

## 2022-05-02 MED ORDER — HYDROMORPHONE HCL 1 MG/ML IJ SOLN
0.5000 mg | INTRAMUSCULAR | Status: DC | PRN
  Administered 2022-05-02 – 2022-05-09 (×11): 1 mg via INTRAVENOUS
  Filled 2022-05-02 (×11): qty 1

## 2022-05-02 MED ORDER — INSULIN ASPART 100 UNIT/ML IJ SOLN
0.0000 [IU] | INTRAMUSCULAR | Status: DC | PRN
  Administered 2022-05-02: 4 [IU] via SUBCUTANEOUS
  Filled 2022-05-02: qty 1

## 2022-05-02 MED ORDER — POLYETHYLENE GLYCOL 3350 17 G PO PACK
17.0000 g | PACK | Freq: Every day | ORAL | Status: DC | PRN
  Administered 2022-05-08: 17 g via ORAL
  Filled 2022-05-02: qty 1

## 2022-05-02 MED ORDER — AMISULPRIDE (ANTIEMETIC) 5 MG/2ML IV SOLN
5.0000 mg | Freq: Once | INTRAVENOUS | Status: AC
  Administered 2022-05-02: 5 mg via INTRAVENOUS

## 2022-05-02 MED ORDER — FENTANYL CITRATE (PF) 100 MCG/2ML IJ SOLN: 25.0000 ug | INTRAMUSCULAR | Status: DC | PRN

## 2022-05-02 MED ORDER — INSULIN GLARGINE-YFGN 100 UNIT/ML ~~LOC~~ SOLN
28.0000 [IU] | Freq: Every day | SUBCUTANEOUS | Status: DC
  Administered 2022-05-02 – 2022-05-12 (×10): 28 [IU] via SUBCUTANEOUS
  Filled 2022-05-02 (×11): qty 0.28

## 2022-05-02 MED ORDER — ORAL CARE MOUTH RINSE: 15.0000 mL | Freq: Once | OROMUCOSAL | Status: AC

## 2022-05-02 MED ORDER — ACETAMINOPHEN 500 MG PO TABS
1000.0000 mg | ORAL_TABLET | Freq: Once | ORAL | Status: DC
  Filled 2022-05-02: qty 2

## 2022-05-02 MED ORDER — SEVELAMER CARBONATE 800 MG PO TABS
1600.0000 mg | ORAL_TABLET | Freq: Three times a day (TID) | ORAL | Status: DC
  Administered 2022-05-02 – 2022-05-11 (×18): 1600 mg via ORAL
  Filled 2022-05-02 (×21): qty 2

## 2022-05-02 MED ORDER — KETAMINE HCL 10 MG/ML IJ SOLN
INTRAMUSCULAR | Status: DC | PRN
  Administered 2022-05-02: 10 mg via INTRAVENOUS

## 2022-05-02 MED ORDER — INSULIN ASPART 100 UNIT/ML IJ SOLN: 0.0000 [IU] | Freq: Three times a day (TID) | INTRAMUSCULAR | Status: DC

## 2022-05-02 MED ORDER — VORTIOXETINE HBR 20 MG PO TABS
20.0000 mg | ORAL_TABLET | Freq: Every day | ORAL | Status: DC
  Administered 2022-05-02 – 2022-05-12 (×9): 20 mg via ORAL
  Filled 2022-05-02 (×11): qty 1

## 2022-05-02 MED ORDER — AMIODARONE HCL 200 MG PO TABS
200.0000 mg | ORAL_TABLET | Freq: Every day | ORAL | Status: DC
  Administered 2022-05-02 – 2022-05-12 (×9): 200 mg via ORAL
  Filled 2022-05-02 (×9): qty 1

## 2022-05-02 MED ORDER — ACETAMINOPHEN 325 MG PO TABS
325.0000 mg | ORAL_TABLET | Freq: Four times a day (QID) | ORAL | Status: DC | PRN
  Administered 2022-05-03 – 2022-05-07 (×4): 650 mg via ORAL
  Filled 2022-05-02 (×5): qty 2

## 2022-05-02 MED ORDER — ZINC SULFATE 220 (50 ZN) MG PO CAPS
220.0000 mg | ORAL_CAPSULE | Freq: Every day | ORAL | Status: DC
  Administered 2022-05-03 – 2022-05-12 (×8): 220 mg via ORAL
  Filled 2022-05-02 (×9): qty 1

## 2022-05-02 MED ORDER — ALBUTEROL SULFATE (2.5 MG/3ML) 0.083% IN NEBU
3.0000 mL | INHALATION_SOLUTION | Freq: Four times a day (QID) | RESPIRATORY_TRACT | Status: DC | PRN
  Administered 2022-05-08: 3 mL via RESPIRATORY_TRACT
  Filled 2022-05-02: qty 3

## 2022-05-02 MED ORDER — LEVOTHYROXINE SODIUM 50 MCG PO TABS
50.0000 ug | ORAL_TABLET | Freq: Every day | ORAL | Status: DC
  Administered 2022-05-03 – 2022-05-12 (×10): 50 ug via ORAL
  Filled 2022-05-02 (×10): qty 1

## 2022-05-02 SURGICAL SUPPLY — 44 items
BAG COUNTER SPONGE SURGICOUNT (BAG) IMPLANT
BLADE SAW RECIP 87.9 MT (BLADE) ×1 IMPLANT
BLADE SURG 21 STRL SS (BLADE) ×1 IMPLANT
BNDG COHESIVE 1X5 TAN STRL LF (GAUZE/BANDAGES/DRESSINGS) IMPLANT
BNDG COHESIVE 6X5 TAN NS LF (GAUZE/BANDAGES/DRESSINGS) IMPLANT
BNDG COHESIVE 6X5 TAN STRL LF (GAUZE/BANDAGES/DRESSINGS) IMPLANT
BNDG GAUZE DERMACEA FLUFF 4 (GAUZE/BANDAGES/DRESSINGS) IMPLANT
CANISTER WOUND CARE 500ML ATS (WOUND CARE) ×1 IMPLANT
COVER SURGICAL LIGHT HANDLE (MISCELLANEOUS) ×1 IMPLANT
CUFF TOURN SGL QUICK 34 (TOURNIQUET CUFF) ×1
CUFF TRNQT CYL 34X4.125X (TOURNIQUET CUFF) ×1 IMPLANT
DRAPE DERMATAC (DRAPES) IMPLANT
DRAPE INCISE IOBAN 66X45 STRL (DRAPES) ×1 IMPLANT
DRAPE U-SHAPE 47X51 STRL (DRAPES) ×1 IMPLANT
DRESSING PREVENA PLUS CUSTOM (GAUZE/BANDAGES/DRESSINGS) ×1 IMPLANT
DRSG PREVENA PLUS CUSTOM (GAUZE/BANDAGES/DRESSINGS) ×1
DURAPREP 26ML APPLICATOR (WOUND CARE) ×1 IMPLANT
ELECT REM PT RETURN 9FT ADLT (ELECTROSURGICAL) ×1
ELECTRODE REM PT RTRN 9FT ADLT (ELECTROSURGICAL) ×1 IMPLANT
GAUZE PAD ABD 7.5X8 STRL (GAUZE/BANDAGES/DRESSINGS) IMPLANT
GAUZE SPONGE 4X4 12PLY STRL LF (GAUZE/BANDAGES/DRESSINGS) IMPLANT
GLOVE BIOGEL PI IND STRL 9 (GLOVE) ×1 IMPLANT
GLOVE SURG ORTHO 9.0 STRL STRW (GLOVE) ×1 IMPLANT
GOWN STRL REUS W/ TWL XL LVL3 (GOWN DISPOSABLE) ×2 IMPLANT
GOWN STRL REUS W/TWL XL LVL3 (GOWN DISPOSABLE) ×2
GRAFT SKIN WND SURGICLOSE M95 (Tissue) IMPLANT
KIT BASIN OR (CUSTOM PROCEDURE TRAY) ×1 IMPLANT
KIT TURNOVER KIT B (KITS) ×1 IMPLANT
MANIFOLD NEPTUNE II (INSTRUMENTS) ×1 IMPLANT
NS IRRIG 1000ML POUR BTL (IV SOLUTION) ×1 IMPLANT
PACK ORTHO EXTREMITY (CUSTOM PROCEDURE TRAY) ×1 IMPLANT
PAD ARMBOARD 7.5X6 YLW CONV (MISCELLANEOUS) ×1 IMPLANT
PREVENA RESTOR ARTHOFORM 46X30 (CANNISTER) ×1 IMPLANT
PREVENA RESTOR AXIOFORM 29X28 (GAUZE/BANDAGES/DRESSINGS) IMPLANT
SPONGE T-LAP 18X18 ~~LOC~~+RFID (SPONGE) IMPLANT
STAPLER VISISTAT 35W (STAPLE) IMPLANT
STOCKINETTE IMPERVIOUS LG (DRAPES) ×1 IMPLANT
SUT ETHILON 2 0 PSLX (SUTURE) IMPLANT
SUT SILK 2 0 (SUTURE) ×1
SUT SILK 2-0 18XBRD TIE 12 (SUTURE) ×1 IMPLANT
SUT VIC AB 1 CTX 27 (SUTURE) ×2 IMPLANT
TOWEL GREEN STERILE (TOWEL DISPOSABLE) ×1 IMPLANT
TUBE CONNECTING 12X1/4 (SUCTIONS) ×1 IMPLANT
YANKAUER SUCT BULB TIP NO VENT (SUCTIONS) ×1 IMPLANT

## 2022-05-02 NOTE — Interval H&P Note (Signed)
History and Physical Interval Note:  05/02/2022 8:28 AM  Susan [name]  has presented today for surgery, with the diagnosis of Gangrene Right Heel.  The various methods of treatment have been discussed with the patient and family. After consideration of risks, benefits and other options for treatment, the patient has consented to  Procedure(s): RIGHT BELOW KNEE AMPUTATION (Right) as a surgical intervention.  The patient's history has been reviewed, patient examined, no change in status, stable for surgery.  I have reviewed the patient's chart and labs.  Questions were answered to the patient's satisfaction.     Newt Minion

## 2022-05-02 NOTE — H&P (Signed)
Susan [name] is an 71 y.o. female.   Chief Complaint: Painful necrotic right heel ulcer and right calf ulcer. HPI: Patient is a 71 year old woman who is seen in follow-up for both lower extremities. Recently patient has started dialysis she had massive swelling of both lower extremities. Patient has undergone Kerecis tissue graft to the venous ulcer left lower extremity and this has healed remarkably well. Patient has progressive venous ulceration and ischemic changes to the right lower extremity.   Past Medical History:  Diagnosis Date   Acute MI ([place]) 1999; 2007; 03/05/21   Anemia    hx   Anginal pain (HCC)    Anxiety    ARF (acute renal failure) ([place]) 06/2017   [facility]   Arthritis    "generalized" (03/15/2014)   CAD (coronary artery disease)    MI in 2000 - MI  2007 - treated bare metal stent (no nuclear since then as 9/11)   Carotid artery disease (HCC)    CHF (congestive heart failure) ([place])    HFrEF 03/06/21   Chronic diastolic heart failure ([place])    a) ECHO (08/2013) EF 55-60% and RV function nl b) RHC (08/2013) RA 4, RV 30/5/7, PA 25/10 (16), PCWP 7, Fick CO/CI 6.3/2.7, PVR 1.5 WU, PA 61 and 66%   Daily headache    "~ every other day; since I fell in June" (03/15/2014)   Depression    Dyslipidemia    Dyspnea    uses oxygen qhs via [place]   ESRD (end stage renal disease) ([place])    Dialysis on Tues Thurs Sat   Exertional shortness of breath    History of kidney stones    HTN (hypertension)    Hypothyroidism    Neuropathy    Obesity    Osteoarthritis    PAF (paroxysmal atrial fibrillation) (HCC)    Peripheral neuropathy    bilateral feet/hands   PONV (postoperative nausea and vomiting)    RBBB (right bundle branch block)    Old   Stroke ([place])    mini strokes   Syncope    likely due to low blood sugar   Tachycardia    Sinus tachycardia   Type II diabetes mellitus (HCC)    Type II, Lamar Sprinkles libre left upper arm. patient has omnipod insulin pump with Novolin  R Insulin   Urinary incontinence    Venous insufficiency     Past Surgical History:  Procedure Laterality Date   ABDOMINAL HYSTERECTOMY  1980's   AMPUTATION Right 02/24/2018   Procedure: RIGHT FOOT GREAT TOE AND 2ND TOE AMPUTATION;  Surgeon: Newt Minion, MD;  Location: [facility];  Service: Orthopedics;  Laterality: Right;   AMPUTATION Right 04/30/2018   Procedure: RIGHT TRANSMETATARSAL AMPUTATION;  Surgeon: Newt Minion, MD;  Location: [facility];  Service: Orthopedics;  Laterality: Right;   AV FISTULA PLACEMENT Left 04/02/2022   Procedure: LEFT ARM ARTERIOVENOUS (AV) FISTULA CREATION;  Surgeon: Serafina Mitchell, MD;  Location: [place];  Service: Vascular;  Laterality: Left;  PERIPHERAL NERVE BLOCK   BIOPSY  05/27/2020   Procedure: BIOPSY;  Surgeon: [name], DO;  [facility];  Service: Endoscopy;;   CATARACT EXTRACTION, BILATERAL Bilateral ?2013   COLONOSCOPY W/ POLYPECTOMY     COLONOSCOPY WITH PROPOFOL N/A 03/13/2019   Procedure: COLONOSCOPY WITH PROPOFOL;  Surgeon: Jerene Bears, MD;  Location: [place];  Service: Gastroenterology;  Laterality: N/A;   [place]; 2007   "1 +  1"   ERCP N/A 02/03/2022   Procedure: ENDOSCOPIC RETROGRADE CHOLANGIOPANCREATOGRAPHY (ERCP);  Surgeon: Carol Ada, MD;  Location: [place];  Service: Gastroenterology;  Laterality: N/A;   ESOPHAGOGASTRODUODENOSCOPY (EGD) WITH PROPOFOL N/A 03/13/2019   Procedure: ESOPHAGOGASTRODUODENOSCOPY (EGD) WITH PROPOFOL;  Surgeon: Jerene Bears, MD;  Location: [facility] ENDOSCOPY;  Service: Gastroenterology;  Laterality: N/A;   ESOPHAGOGASTRODUODENOSCOPY (EGD) WITH PROPOFOL N/A 05/27/2020   Procedure: ESOPHAGOGASTRODUODENOSCOPY (EGD) WITH PROPOFOL;  Surgeon: [name], DO;  [facility];  Service: Endoscopy;  Laterality: N/A;   EYE SURGERY Bilateral    lazer   HEMOSTASIS CLIP PLACEMENT  03/13/2019   Procedure: HEMOSTASIS CLIP PLACEMENT;  Surgeon: Jerene Bears, MD;  Location: [facility];  Service: Gastroenterology;;   INSERTION OF DIALYSIS CATHETER Right 04/02/2022   Procedure: INSERTION OF TUNNELED DIALYSIS CATHETER;  Surgeon: Serafina Mitchell, MD;  Location: [place];  Service: Vascular;  Laterality: Right;   KNEE ARTHROSCOPY Left 10/25/2006   POLYPECTOMY  03/13/2019   Procedure: POLYPECTOMY;  Surgeon: Jerene Bears, MD;  [facility];  Service: Gastroenterology;;   REMOVAL OF STONES  02/03/2022   Procedure: REMOVAL OF STONES;  Surgeon: Carol Ada, MD;  Location: [place];  Service: Gastroenterology;;   RIGHT HEART CATH N/A 07/24/2017   Procedure: RIGHT HEART CATH;  Surgeon: Jolaine Artist, MD;  Location: [facility] CV LAB;  Service: Cardiovascular;  Laterality: N/A;   RIGHT HEART CATHETERIZATION N/A 09/22/2013   Procedure: RIGHT HEART CATH;  Surgeon: Jolaine Artist, MD;  Location: [facility] C[facility] LAB;  Service: Cardiovascular;  Laterality: N/A;   SHOULDER ARTHROSCOPY WITH OPEN ROTATOR CUFF REPAIR Right 03/14/2014   Procedure: RIGHT SHOULDER ARTHROSCOPY WITH BICEPS RELEASE, OPEN SUBSCAPULA REPAIR, OPEN SUPRASPINATUS REPAIR.;  Surgeon: Meredith Pel, MD;  Location: [place];  Service: Orthopedics;  Laterality: Right;   SPHINCTEROTOMY  02/03/2022   Procedure: SPHINCTEROTOMY;  Surgeon: Carol Ada, MD;  Location: [facility];  Service: Gastroenterology;;   TEE WITHOUT CARDIOVERSION N/A 02/04/2022   Procedure: TRANSESOPHAGEAL ECHOCARDIOGRAM (TEE);  Surgeon: Jolaine Artist, MD;  Location: [facility] ENDOSCOPY;  Service: Cardiovascular;  Laterality: N/A;   TOE AMPUTATION Right 02/24/2018   GREAT TOE AND 2ND TOE AMPUTATION   TUBAL LIGATION  60's    Family History  Problem Relation Age of Onset   Heart attack Mother 73   Social History:  reports that she quit smoking about 24 years ago. Her smoking use included cigarettes. She has a 96.00 pack-year smoking history. She has never used smokeless tobacco. She reports that she does not  currently use alcohol. She reports that she does not use drugs.  Allergies:  Allergies  Allergen Reactions   Keflex [Cephalexin] Diarrhea   Codeine Nausea And Vomiting and Other (See Comments)    No medications prior to admission.    No results found. However, due to the size of the patient record, not all encounters were searched. Please check Results Review for a complete set of results. No results found.  Review of Systems  All other systems reviewed and are negative.   Height '5\' 5"'$  (1.651 m), weight 113.9 kg. Physical Exam  Patient is alert, oriented, no adenopathy, well-dressed, normal affect, normal respiratory effort. Examination the Doppler was used and patient has a strong biphasic dorsalis pedis and a strong monophasic posterior tibial pulse on the right.  The right calf distally has a necrotic ulcer approximately 10 cm in diameter and a black necrotic ulcer over the calcaneus with exposed bone  that is 6 cm in diameter.  The heel ulcer is very painful.  Examination of the left lower extremity the venous stasis ulcer treated with Leroy Kennedy has completely healed patient does have some small abrasions proximal aspect of the left calf where the compression wrap rolled down.  These should resolve with repeat compression.  Patient's hemoglobin A1c has been consistently greater than 15.5. Assessment/Plan 1. 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 ([place])   2. History of transmetatarsal amputation of right foot ([place])   3. Subacute osteomyelitis, right ankle and foot ([place])       Plan: With the large necrotic ulcer to the right heel and large necrotic posterior calf ulcer on the right discussed with the patient recommendation to proceed with a transtibial amputation.  Newt Minion, MD 05/02/2022, 6:54 AM

## 2022-05-02 NOTE — Anesthesia Procedure Notes (Signed)
Procedure Name: MAC Date/Time: 05/02/2022 10:04 AM  Performed by: Darletta Moll, CRNAPre-anesthesia Checklist: Patient identified, Emergency Drugs available, Suction available and Patient being monitored Patient Re-evaluated:Patient Re-evaluated prior to induction Oxygen Delivery Method: Nasal cannula

## 2022-05-02 NOTE — Anesthesia Procedure Notes (Addendum)
    Anesthesia Regional Block: Popliteal block   Pre-Anesthetic Checklist: , timeout performed,  Correct Patient, Correct Site, Correct Laterality,  Correct Procedure, Correct Position, site marked,  Risks and benefits discussed,  Pre-op evaluation,  At surgeon's request and post-op pain management  Laterality: Right  Prep: Maximum Sterile Barrier Precautions used, chloraprep       Needles:  Injection technique: Single-shot  Needle Type: Echogenic Stimulator Needle     Needle Length: 9cm  Needle Gauge: 21     Additional Needles:   Procedures:, nerve stimulator,,, ultrasound used (permanent image in chart),,     Nerve Stimulator or Paresthesia:  Response: Peroneal Response: Tibial  Additional Responses:   Narrative:  Start time: 05/02/2022 9:03 AM End time: 05/02/2022 9:13 AM Injection made incrementally with aspirations every 5 mL.  Performed by: Personally  Anesthesiologist: Roderic Palau, MD  Additional Notes: Adductor canal block with 15cc of 0.5% Bupivicaine with 1:200k epi.

## 2022-05-02 NOTE — Anesthesia Postprocedure Evaluation (Signed)
Anesthesia Post Note  Patient: Susan [name]  Procedure(s) Performed: RIGHT BELOW KNEE AMPUTATION (Right: Knee)     Patient location during evaluation: PACU Anesthesia Type: Regional and MAC Level of consciousness: awake and alert Pain management: pain level controlled Vital Signs Assessment: post-procedure vital signs reviewed and stable Respiratory status: spontaneous breathing, nonlabored ventilation, respiratory function stable and patient connected to nasal cannula oxygen Cardiovascular status: stable and blood pressure returned to baseline Postop Assessment: no apparent nausea or vomiting Anesthetic complications: no  No notable events documented.  Last Vitals:  Vitals:   05/02/22 1250 05/02/22 1305  BP: (!) 115/51 (!) 116/51  Pulse: 76   Resp:    Temp:    SpO2: 98%     Last Pain:  Vitals:   05/02/22 1231  TempSrc: Oral  PainSc:                  Calirose Mccance,W. EDMOND

## 2022-05-02 NOTE — Anesthesia Preprocedure Evaluation (Addendum)
Anesthesia Evaluation  Patient identified by MRN, date of birth, ID band Patient awake    Reviewed: Allergy & Precautions, H&P , NPO status , Patient's Chart, lab work & pertinent test results  History of Anesthesia Complications (+) PONV and history of anesthetic complications  Airway Mallampati: III  TM Distance: >3 FB Neck ROM: Full    Dental no notable dental hx. (+) Teeth Intact, Dental Advisory Given   Pulmonary former smoker   Pulmonary exam normal breath sounds clear to auscultation       Cardiovascular hypertension, + CAD, + Past MI, + Peripheral Vascular Disease and +CHF  + dysrhythmias  Rhythm:Regular Rate:Normal     Neuro/Psych  Headaches  Anxiety Depression    CVA    GI/Hepatic Neg liver ROS,GERD  Medicated,,  Endo/Other  diabetes, Type 1, Insulin DependentHypothyroidism  Morbid obesity  Renal/GU ESRFRenal disease  negative genitourinary   Musculoskeletal  (+) Arthritis ,    Abdominal   Peds  Hematology  (+) Blood dyscrasia, anemia   Anesthesia Other Findings   Reproductive/Obstetrics negative OB ROS                             Anesthesia Physical Anesthesia Plan  ASA: 4  Anesthesia Plan: MAC and Regional   Post-op Pain Management: Tylenol PO (pre-op)*   Induction: Intravenous  PONV Risk Score and Plan: 4 or greater and 3 and Ondansetron, Dexamethasone and Propofol infusion  Airway Management Planned: Natural Airway and Simple Face Mask  Additional Equipment:   Intra-op Plan:   Post-operative Plan:   Informed Consent: I have reviewed the patients History and Physical, chart, labs and discussed the procedure including the risks, benefits and alternatives for the proposed anesthesia with the patient or authorized representative who has indicated his/her understanding and acceptance.     Dental advisory given  Plan Discussed with: CRNA  Anesthesia Plan  Comments:        Anesthesia Quick Evaluation

## 2022-05-02 NOTE — Op Note (Signed)
05/02/2022  5:53 PM  PATIENT:  Susan [name]    PRE-OPERATIVE DIAGNOSIS:  Gangrene Right Heel  POST-OPERATIVE DIAGNOSIS:  Same  PROCEDURE:  RIGHT BELOW KNEE AMPUTATION Application of Kerecis micro graft 38 cm and Kerecis sheet 7 x 10 cm. Application of Prevena customizable and Prevena arthroform wound VAC dressings Application of Vive Wear stump shrinker and the Hanger limb protector  SURGEON:  Newt Minion, MD  ANESTHESIA:   General  PREOPERATIVE INDICATIONS:  Susan [name] is a  71 y.o. female with a diagnosis of Gangrene Right Heel who failed conservative measures and elected for surgical management.    The risks benefits and alternatives were discussed with the patient preoperatively including but not limited to the risks of infection, bleeding, nerve injury, cardiopulmonary complications, the need for revision surgery, among others, and the patient was willing to proceed.  OPERATIVE IMPLANTS: Kerecis micro graft 38 cm and Kerecis sheet 7 x 10 cm.   OPERATIVE FINDINGS: Tissue margins were clear.  OPERATIVE PROCEDURE: Patient was brought to the operating room after undergoing a regional anesthetic.  After adequate levels anesthesia were obtained a thigh tourniquet was placed and the lower extremity was prepped using DuraPrep draped into a sterile field. The foot was draped out of the sterile field with impervious stockinette.  A timeout was called and the tourniquet inflated.  A transverse skin incision was made 12 cm distal to the tibial tubercle, the incision curved proximally, and a large posterior flap was created.  The tibia was transected just proximal to the skin incision and beveled anteriorly.  The fibula was transected just proximal to the tibial incision.  The sciatic nerve was pulled cut and allowed to retract.  The vascular bundles were suture ligated with 2-0 silk.  The tourniquet was deflated and hemostasis obtained.    Drill holes were placed through the tibia and  fibula to secure the [facility] tissue graft and the gastrocnemius fascia.    The Kerecis micro powder 38 cm was applied to the open wound that has a 200 cm surface area.  The 7 x 10 cm Kerecis sheet was then folded and secured to the distal tibia and fibula with #1 Vicryl.  A separate drill hole was then used to secure the [facility] tissue graft and the gastrocnemius fascia to the dorsum of the tibia.    The deep and superficial fascial layers were closed using #1 Vicryl.  The skin was closed using staples.    The Prevena customizable dressing was applied this was overwrapped with the arthroform sponge.  Charlie Pitter was used to secure the sponges and the circumferential compression was secured to the skin with Dermatac.  This was connected to the wound VAC pump and had a good suction fit this was covered with a stump shrinker and a limb protector.  Patient was taken to the PACU in stable condition.   DISCHARGE PLANNING:  Antibiotic duration: 24-hour antibiotics  Weightbearing: Nonweightbearing on the operative extremity  Pain medication: Opioid pathway  Dressing care/ Wound VAC: Continue wound VAC with the Prevena plus pump at discharge for 1 week  Ambulatory devices: Walker or kneeling scooter  Discharge to: Discharge planning based on recommendations per physical therapy  Follow-up: In the office 1 week after discharge.

## 2022-05-02 NOTE — Progress Notes (Addendum)
Inpatient Diabetes Program Recommendations  AACE/ADA: New Consensus Statement on Inpatient Glycemic Control (2015)  Target Ranges:  Prepandial:   less than 140 mg/dL      Peak postprandial:   less than 180 mg/dL (1-2 hours)      Critically ill patients:  140 - 180 mg/dL   Lab Results  Component Value Date   GLUCAP 219 (H) 05/02/2022   HGBA1C >15.5 (H) 01/20/2022    Review of Glycemic Control  Latest Reference Range & Units 05/02/22 08:10 05/02/22 10:55 05/02/22 12:50  Glucose-Capillary 70 - 99 mg/dL 267 (H) 268 (H) 219 (H)   Diabetes history: DM  Outpatient Diabetes medications:  Omnipod insulin pump (see's Dr. [name])- patient uses U100 Regular insulin in pump  12a- 0.75 units/hr   5a- 0.85 units/hr  9a- 1.5 units/hr   7p- 0.75 units/hr Total basal=25.9 units/24 hours Per husband, they do use the bolus wizard.   Current settings are: 1 unit/12 grams of CHO Correction factor=40 mg/dL Goal=150 mg/dL Current orders for Inpatient glycemic control:  pending  Inpatient Diabetes Program Recommendations:    Patient states she is unable to independently manage insulin pump.  Pod is due to expire at [place].   Recommend removal of insulin pump and start Semglee 28 units daily, Novolog sensitive correction tid with meals and HS, and Novolog 3 units tid with meals (meal coverage-hold if patient eats less than 50% or NPO).  Awaiting orders from MD.    Thanks,  Adah Perl, RN, BC-ADM Inpatient Diabetes Coordinator Pager [phone]  (8a-5p)

## 2022-05-02 NOTE — Consult Note (Signed)
Hospital Consult    Reason for Consult:  Concern for left arm AVF infection Referring Physician:  Dr. Sharol Given MRN #:  [id number]  History of Present Illness: This is a 71 y.o. female with multiple comorbidities as listed including end-stage renal disease that vascular surgery has been consulted for possible infection of her left arm AV fistula.  Patient underwent right below-knee amputation with Dr. Sharol Given today.  Dr. Sharol Given noted her left arm fistula appeared infected in the operating room.  He was able to express significant amount of clear drainage form the incision in her left arm.  She has a left first stage basilic vein fistula placed on 04/02/2022 by Dr. Trula Slade.  She has a right IJ [facility] for immediate dialysis needs.  Past Medical History:  Diagnosis Date   Acute MI ([place]) 1999; 2007; 03/05/21   Anemia    hx   Anginal pain (HCC)    Anxiety    ARF (acute renal failure) ([place]) 06/2017   [facility]   Arthritis    "generalized" (03/15/2014)   CAD (coronary artery disease)    MI in 2000 - MI  2007 - treated bare metal stent (no nuclear since then as 9/11)   Carotid artery disease (HCC)    CHF (congestive heart failure) ([place])    HFrEF 03/06/21   Chronic diastolic heart failure ([place])    a) ECHO (08/2013) EF 55-60% and RV function nl b) RHC (08/2013) RA 4, RV 30/5/7, PA 25/10 (16), PCWP 7, Fick CO/CI 6.3/2.7, PVR 1.5 WU, PA 61 and 66%   Daily headache    "~ every other day; since I fell in June" (03/15/2014)   Depression    Dyslipidemia    Dyspnea    uses oxygen qhs via [place]   ESRD (end stage renal disease) ([place])    Dialysis on Tues Thurs Sat   Exertional shortness of breath    History of kidney stones    HTN (hypertension)    Hypothyroidism    Neuropathy    Obesity    Osteoarthritis    PAF (paroxysmal atrial fibrillation) (HCC)    Peripheral neuropathy    bilateral feet/hands   PONV (postoperative nausea and vomiting)    RBBB (right bundle branch block)    Old   Stroke  ([place])    mini strokes   Syncope    likely due to low blood sugar   Tachycardia    Sinus tachycardia   Type II diabetes mellitus (HCC)    Type II, Lamar Sprinkles libre left upper arm. patient has omnipod insulin pump with Novolin R Insulin   Urinary incontinence    Venous insufficiency     Past Surgical History:  Procedure Laterality Date   ABDOMINAL HYSTERECTOMY  1980's   AMPUTATION Right 02/24/2018   Procedure: RIGHT FOOT GREAT TOE AND 2ND TOE AMPUTATION;  Surgeon: Newt Minion, MD;  Location: [place];  Service: Orthopedics;  Laterality: Right;   AMPUTATION Right 04/30/2018   Procedure: RIGHT TRANSMETATARSAL AMPUTATION;  Surgeon: Newt Minion, MD;  Location: [place];  Service: Orthopedics;  Laterality: Right;   AV FISTULA PLACEMENT Left 04/02/2022   Procedure: LEFT ARM ARTERIOVENOUS (AV) FISTULA CREATION;  Surgeon: Serafina Mitchell, MD;  Location: [place];  Service: Vascular;  Laterality: Left;  PERIPHERAL NERVE BLOCK   BIOPSY  05/27/2020   Procedure: BIOPSY;  Surgeon: [name], DO;  [facility];  Service: Endoscopy;;   CATARACT EXTRACTION, BILATERAL Bilateral ?2013  COLONOSCOPY W/ POLYPECTOMY     COLONOSCOPY WITH PROPOFOL N/A 03/13/2019   Procedure: COLONOSCOPY WITH PROPOFOL;  Surgeon: Jerene Bears, MD;  Location: [place];  Service: Gastroenterology;  Laterality: N/A;   CORONARY ANGIOPLASTY WITH STENT PLACEMENT  1999; 2007   "1 + 1"   ERCP N/A 02/03/2022   Procedure: ENDOSCOPIC RETROGRADE CHOLANGIOPANCREATOGRAPHY (ERCP);  Surgeon: Carol Ada, MD;  Location: [place];  Service: Gastroenterology;  Laterality: N/A;   ESOPHAGOGASTRODUODENOSCOPY (EGD) WITH PROPOFOL N/A 03/13/2019   Procedure: ESOPHAGOGASTRODUODENOSCOPY (EGD) WITH PROPOFOL;  Surgeon: Jerene Bears, MD;  Location: [facility] ENDOSCOPY;  Service: Gastroenterology;  Laterality: N/A;   ESOPHAGOGASTRODUODENOSCOPY (EGD) WITH PROPOFOL N/A 05/27/2020   Procedure: ESOPHAGOGASTRODUODENOSCOPY (EGD) WITH PROPOFOL;   Surgeon: [name], DO;  [facility];  Service: Endoscopy;  Laterality: N/A;   EYE SURGERY Bilateral    lazer   HEMOSTASIS CLIP PLACEMENT  03/13/2019   Procedure: HEMOSTASIS CLIP PLACEMENT;  Surgeon: Jerene Bears, MD;  Location: [place] ENDOSCOPY;  Service: Gastroenterology;;   INSERTION OF DIALYSIS CATHETER Right 04/02/2022   Procedure: INSERTION OF TUNNELED DIALYSIS CATHETER;  Surgeon: Serafina Mitchell, MD;  Location: [place];  Service: Vascular;  Laterality: Right;   KNEE ARTHROSCOPY Left 10/25/2006   POLYPECTOMY  03/13/2019   Procedure: POLYPECTOMY;  Surgeon: Jerene Bears, MD;  Location: [facility] ENDOSCOPY;  Service: Gastroenterology;;   REMOVAL OF STONES  02/03/2022   Procedure: REMOVAL OF STONES;  Surgeon: Carol Ada, MD;  Location: [place];  Service: Gastroenterology;;   RIGHT HEART CATH N/A 07/24/2017   Procedure: RIGHT HEART CATH;  Surgeon: Jolaine Artist, MD;  Location: [place] [facility];  Service: Cardiovascular;  Laterality: N/A;   RIGHT HEART CATHETERIZATION N/A 09/22/2013   Procedure: RIGHT HEART CATH;  Surgeon: Jolaine Artist, MD;  Location: [facility] CATH LAB;  Service: Cardiovascular;  Laterality: N/A;   SHOULDER ARTHROSCOPY WITH OPEN ROTATOR CUFF REPAIR Right 03/14/2014   Procedure: RIGHT SHOULDER ARTHROSCOPY WITH BICEPS RELEASE, OPEN SUBSCAPULA REPAIR, OPEN SUPRASPINATUS REPAIR.;  Surgeon: Meredith Pel, MD;  Location: [place];  Service: Orthopedics;  Laterality: Right;   SPHINCTEROTOMY  02/03/2022   Procedure: SPHINCTEROTOMY;  Surgeon: Carol Ada, MD;  Location: [place];  Service: Gastroenterology;;   TEE WITHOUT CARDIOVERSION N/A 02/04/2022   Procedure: TRANSESOPHAGEAL ECHOCARDIOGRAM (TEE);  Surgeon: Jolaine Artist, MD;  [facility] ENDOSCOPY;  Service: Cardiovascular;  Laterality: N/A;   TOE AMPUTATION Right 02/24/2018   GREAT TOE AND 2ND TOE AMPUTATION   TUBAL LIGATION  1970's    Allergies  Allergen Reactions   Keflex [Cephalexin]  Diarrhea   Codeine Nausea And Vomiting and Other (See Comments)    Prior to Admission medications   Medication Sig Start Date End Date Taking? Authorizing Provider  albuterol (VENTOLIN HFA) 108 (90 Base) MCG/ACT inhaler Inhale 1-2 puffs into the lungs every 6 (six) hours as needed for wheezing or shortness of breath.   Yes [provider]  amiodarone (PACERONE) 200 MG tablet Take 1 tablet (200 mg total) by mouth daily. 01/29/22  Yes Milford, Maricela Bo, FNP  apixaban (ELIQUIS) 5 MG TABS tablet Take 1 tablet (5 mg total) by mouth 2 (two) times daily. 01/29/22  Yes [place], Maricela Bo, FNP  aspirin EC 81 MG tablet Take 1 tablet (81 mg total) by mouth daily with breakfast. 05/28/20  Yes Emokpae, Courage, MD  B Complex-C-Folic Acid (DIALYVITE TABLET) TABS Take 1 tablet by mouth daily. 04/22/22  Yes [provider]  diphenoxylate-atropine (LOMOTIL)  2.5-0.025 MG tablet Take 1 tablet by mouth 4 (four) times daily as needed for diarrhea or loose stools. 08/19/21  Yes [name], MD  ferrous sulfate 325 (65 FE) MG tablet Take 325 mg by mouth 2 (two) times daily.   Yes [provider]  fidaxomicin (DIFICID) 200 MG TABS tablet Take 1 tablet (200 mg total) by mouth 2 (two) times daily. 03/28/22  Yes Rosiland Oz, MD  hydrALAZINE (APRESOLINE) 25 MG tablet Take 1 tablet (25 mg total) by mouth every 6 (six) hours as needed (SBP>150 or DBP>100). 04/06/22  Yes Nita Sells, MD  Insulin Human (INSULIN PUMP) SOLN Inject 1 each into the skin 3 times daily with meals, bedtime and 2 AM. 04/06/22  Yes Samtani, Jai-Gurmukh, MD  insulin regular (NOVOLIN R RELION) 100 units/mL injection Inject into the skin See admin instructions. Via Insulin Pump   Yes [provider]  levothyroxine (SYNTHROID, LEVOTHROID) 50 MCG tablet Take 1 tablet (50 mcg total) by mouth daily before breakfast. 11/07/16  Yes Dena Billet B, PA-C  linagliptin (TRADJENTA) 5 MG TABS tablet Take 1 tablet (5 mg  total) by mouth daily. 04/07/22  Yes Nita Sells, MD  magnesium oxide (MAG-OX) 400 (240 Mg) MG tablet Take 1 tablet (400 mg total) by mouth daily. 07/18/21  Yes [name], MD  oxyCODONE-acetaminophen (PERCOCET/ROXICET) 5-325 MG tablet Take 1 tablet by mouth every 4 (four) hours as needed for severe pain. 04/29/22  Yes Newt Minion, MD  OXYGEN Inhale 2 L/min into the lungs at bedtime.   Yes [provider]  pregabalin (LYRICA) 75 MG capsule Take 1 capsule (75 mg total) by mouth daily. 04/07/22  Yes Nita Sells, MD  sevelamer carbonate (RENVELA) 800 MG tablet Take 1,600 mg by mouth 3 (three) times daily with meals.   Yes [provider]  vortioxetine HBr (TRINTELLIX) 10 MG TABS tablet Take 2 tablets (20 mg total) by mouth daily. 04/23/22  Yes [name], MD  blood glucose meter kit and supplies Use up to four times daily as directed. 01/23/22   Ghimire, Henreitta Leber, MD  Continuous Blood Gluc Sensor (FREESTYLE LIBRE 2 SENSOR) MISC  01/14/22   [provider]  Fingerstix Lancets MISC Use as directed to check blood sugars as need up to 4 times daily 01/23/22   Jonetta Osgood, MD  Insulin Pen Needle 32G X 4 MM MISC Use as directed 01/23/22   Ghimire, Henreitta Leber, MD    Social History   Socioeconomic History   Marital status: Married    Spouse name: Percell Miller   Number of children: 3   Years of education: 12th   Highest education level: Not on file  Occupational History    Employer: UNEMPLOYED  Tobacco Use   Smoking status: Former    Packs/day: 3.00    Years: 32.00    Total pack years: 96.00    Types: Cigarettes    Quit date: 10/24/1997    Years since quitting: 24.5   Smokeless tobacco: Never  Vaping Use   Vaping Use: Never used  Substance and Sexual Activity   Alcohol use: Not Currently    Comment: "might have 2-3 daiquiris in the summer"   Drug use: No   Sexual activity: Not Currently    Birth control/protection: Surgical     Comment: Hysterectomy  Other Topics Concern   Not on file  Social History Narrative   Pt lives at home with her spouse.Caffeine Use- 3 sodas daily.  Social Determinants of Health   Financial Resource Strain: Low Risk  (08/16/2021)   Overall Financial Resource Strain (CARDIA)    Difficulty of Paying Living Expenses: Not hard at all  Food Insecurity: No Food Insecurity (04/02/2022)   Hunger Vital Sign    Worried About Running Out of Food in the Last Year: Never true    Ran Out of Food in the Last Year: Never true  Transportation Needs: No Transportation Needs (04/11/2022)   PRAPARE - Hydrologist (Medical): No    Lack of Transportation (Non-Medical): No  Physical Activity: Inactive (08/16/2021)   Exercise Vital Sign    Days of Exercise per Week: 0 days    Minutes of Exercise per Session: 0 min  Stress: No Stress Concern Present (08/16/2021)   [place]    Feeling of Stress : Not at all  Social Connections: Moderately Isolated (08/16/2021)   Social Connection and Isolation Panel [NHANES]    Frequency of Communication with Friends and Family: More than three times a week    Frequency of Social Gatherings with Friends and Family: More than three times a week    Attends Religious Services: Never    Marine scientist or Organizations: No    Attends Archivist Meetings: Never    Marital Status: Married  Human resources officer Violence: Not At Risk (04/02/2022)   Humiliation, Afraid, Rape, and Kick questionnaire    Fear of Current or Ex-Partner: No    Emotionally Abused: No    Physically Abused: No    Sexually Abused: No     Family History  Problem Relation Age of Onset   Heart attack Mother 50    ROS: _0  Positive   _1  Negative   _2  All sytems reviewed and are negative Cardiovascular: _3  chest pain/pressure _4  palpitations _5  SOB lying flat _6  DOE _7  pain in legs while  walking _8  pain in legs at rest _9  pain in legs at night _10  non-healing ulcers _11  hx of DVT _12  swelling in legs  Pulmonary: _13  productive cough _14  asthma/wheezing _15  home O2  Neurologic: _16  weakness in _17  arms _18  legs _19  numbness in _20  arms _21  legs _22  hx of CVA _23  mini stroke _24 difficulty speaking or slurred speech _25  temporary loss of vision in one eye _26  dizziness  Hematologic: _27  hx of cancer _28  bleeding problems _29  problems with blood clotting easily  Endocrine:   _30  diabetes _31  thyroid disease  GI _32  vomiting blood _33  blood in stool  GU: _34  CKD/renal failure _35  HD--_36  M/W/F or _37  T/T/S _38  burning with urination _39  blood in urine  Psychiatric: _40  anxiety _41  depression  Musculoskeletal: _42  arthritis _43  joint pain  Integumentary: _44  rashes _45  ulcers  Constitutional: _46  fever _47  chills   Physical Examination  Vitals:   05/02/22 0910 05/02/22 0915  BP: (!) 142/70 (!) 143/64  Pulse: 88 89  Resp: 14 16  Temp:    SpO2: 91% 96%   Body mass index is 41.77 kg/m.  General:  NAD Gait: Not observed HENT: WNL, normocephalic Pulmonary: normal non-labored breathing Cardiac: regular, without  Murmurs, rubs or gallops Vascular Exam/Pulses: Left arm incision at site of recent basilic vein fistula with blanching erythema and clear drainage from the incision    CBC    Component Value Date/Time   WBC 11.9 (H) 05/02/2022 0849   RBC 3.70 (L) 05/02/2022 0849   HGB 8.7 (L)  05/02/2022 0849   HCT 30.1 (L) 05/02/2022 0849   PLT 344 05/02/2022 0849   MCV 81.4 05/02/2022 0849   MCH 23.5 (L) 05/02/2022 0849   MCHC 28.9 (L) 05/02/2022 0849   RDW 18.6 (H) 05/02/2022 0849   LYMPHSABS 0.7 05/02/2022 0849   MONOABS 0.7 05/02/2022 0849   EOSABS 0.3 05/02/2022 0849   BASOSABS 0.0 05/02/2022 0849    BMET    Component Value Date/Time   NA 134 (L) 05/02/2022 0849   K 2.9 (L) 05/02/2022 0849   CL 93 (L) 05/02/2022 0849   CO2 30 05/02/2022 0849   GLUCOSE  282 (H) 05/02/2022 0849   GLUCOSE >444 (H) 06/07/2015 1311   BUN 28 (H) 05/02/2022 0849   CREATININE 2.93 (H) 05/02/2022 0849   CREATININE 6.22 (H) 03/26/2022 1620   CALCIUM 8.4 (L) 05/02/2022 0849   GFRNONAA 16 (L) 05/02/2022 0849   GFRNONAA 11 (L) 11/15/2020 1452   GFRAA 13 (L) 11/15/2020 1452    COAGS: Lab Results  Component Value Date   INR 1.5 (H) 02/01/2022   INR 1.3 (H) 01/14/2022   INR 0.9 03/05/2021     Non-Invasive Vascular Imaging:    N/A   ASSESSMENT/PLAN: This is a 71 y.o. female with end-stage renal disease that vascular surgery was consulted for possible left arm AV fistula infection.  This appears to be an infected seroma.  Fortunately no prosthetic as she has a left first stage basilic vein fistula recently placed on 04/02/2022 by Dr. Trula Slade.  Would recommend broad-spectrum IV antibiotics for now.  Vascular surgery will follow through the weekend.  Dr. Sharol Given expressed most of the seroma out of the wound in the operating room and I do not feel any remaining fluctuance.  There is a small sinus draining clear fluid from the recent incision as pictured above.  If no improvement on IV antibiotics will need to go to the OR for washout.  Marty Heck, MD [facility] Office: [place]

## 2022-05-02 NOTE — Transfer of Care (Signed)
Immediate Anesthesia Transfer of Care Note  Patient: Susan [name]  Procedure(s) Performed: RIGHT BELOW KNEE AMPUTATION (Right: Knee)  Patient Location: PACU  Anesthesia Type:MAC and Regional  Level of Consciousness: drowsy and patient cooperative  Airway & Oxygen Therapy: Patient Spontanous Breathing and Patient connected to nasal cannula oxygen  Post-op Assessment: Report given to RN, Post -op Vital signs reviewed and stable, and Patient moving all extremities X 4  Post vital signs: Reviewed and stable  Last Vitals:  Vitals Value Taken Time  BP 118/55 05/02/22 1052  Temp    Pulse 85 05/02/22 1058  Resp 11 05/02/22 1058  SpO2 98 % 05/02/22 1058  Vitals shown include unvalidated device data.  Last Pain:  Vitals:   05/02/22 0839  TempSrc:   PainSc: 9       Patients Stated Pain Goal: 4 (34/75/83 0746)  Complications: No notable events documented.

## 2022-05-03 LAB — CBC
HCT: 31.5 % — ABNORMAL LOW (ref 36.0–46.0)
Hemoglobin: 9.4 g/dL — ABNORMAL LOW (ref 12.0–15.0)
MCH: 24.6 pg — ABNORMAL LOW (ref 26.0–34.0)
MCHC: 29.8 g/dL — ABNORMAL LOW (ref 30.0–36.0)
MCV: 82.5 fL (ref 80.0–100.0)
Platelets: 310 10*3/uL (ref 150–400)
RBC: 3.82 MIL/uL — ABNORMAL LOW (ref 3.87–5.11)
RDW: 17.9 % — ABNORMAL HIGH (ref 11.5–15.5)
WBC: 15.3 10*3/uL — ABNORMAL HIGH (ref 4.0–10.5)
nRBC: 0 % (ref 0.0–0.2)

## 2022-05-03 LAB — BASIC METABOLIC PANEL
Anion gap: 14 (ref 5–15)
BUN: 41 mg/dL — ABNORMAL HIGH (ref 8–23)
CO2: 28 mmol/L (ref 22–32)
Calcium: 8.5 mg/dL — ABNORMAL LOW (ref 8.9–10.3)
Chloride: 94 mmol/L — ABNORMAL LOW (ref 98–111)
Creatinine, Ser: 3.62 mg/dL — ABNORMAL HIGH (ref 0.44–1.00)
GFR, Estimated: 13 mL/min — ABNORMAL LOW (ref >60)
Glucose, Bld: 168 mg/dL — ABNORMAL HIGH (ref 70–99)
Potassium: 3.4 mmol/L — ABNORMAL LOW (ref 3.5–5.1)
Sodium: 136 mmol/L (ref 135–145)

## 2022-05-03 LAB — GLUCOSE, CAPILLARY
Glucose-Capillary: 149 mg/dL — ABNORMAL HIGH (ref 70–99)
Glucose-Capillary: 114 mg/dL — ABNORMAL HIGH (ref 70–99)
Glucose-Capillary: 187 mg/dL — ABNORMAL HIGH (ref 70–99)
Glucose-Capillary: 164 mg/dL — ABNORMAL HIGH (ref 70–99)

## 2022-05-03 LAB — PHOSPHORUS: Phosphorus: 3.6 mg/dL (ref 2.5–4.6)

## 2022-05-03 LAB — TYPE AND SCREEN
ABO/RH(D): A POS
Antibody Screen: POSITIVE
DAT, IgG: NEGATIVE
Donor AG Type: NEGATIVE
Donor AG Type: NEGATIVE
Unit division: 0
Unit division: 0

## 2022-05-03 LAB — BPAM RBC
Blood Product Expiration Date: 202401022359
Blood Product Expiration Date: 202401052359
ISSUE DATE / TIME: 202312081156
ISSUE DATE / TIME: 202312081156
Unit Type and Rh: 6200
Unit Type and Rh: 6200

## 2022-05-03 LAB — HEPATITIS B SURFACE ANTIGEN: Hepatitis B Surface Ag: NONREACTIVE

## 2022-05-03 MED ORDER — HEPARIN SODIUM (PORCINE) 1000 UNIT/ML IJ SOLN
INTRAMUSCULAR | Status: AC
  Administered 2022-05-03: 4000 [IU]
  Filled 2022-05-03: qty 4

## 2022-05-03 MED ORDER — CHLORHEXIDINE GLUCONATE CLOTH 2 % EX PADS
6.0000 | MEDICATED_PAD | Freq: Every day | CUTANEOUS | Status: DC
  Administered 2022-05-03 – 2022-05-06 (×4): 6 via TOPICAL

## 2022-05-03 MED ORDER — VANCOMYCIN HCL 1500 MG/300ML IV SOLN
1500.0000 mg | Freq: Once | INTRAVENOUS | Status: AC
  Administered 2022-05-03: 1500 mg via INTRAVENOUS
  Filled 2022-05-03: qty 300

## 2022-05-03 MED ORDER — VANCOMYCIN HCL 500 MG/100ML IV SOLN
500.0000 mg | INTRAVENOUS | Status: DC
  Administered 2022-05-06: 500 mg via INTRAVENOUS
  Filled 2022-05-03 (×3): qty 100

## 2022-05-03 MED ORDER — PROSOURCE PLUS PO LIQD
30.0000 mL | Freq: Two times a day (BID) | ORAL | Status: DC
  Administered 2022-05-03 – 2022-05-12 (×16): 30 mL via ORAL
  Filled 2022-05-03 (×16): qty 30

## 2022-05-03 MED ORDER — SODIUM CHLORIDE 0.9 % IV SOLN
1.0000 g | INTRAVENOUS | Status: DC
  Administered 2022-05-03 – 2022-05-08 (×6): 1 g via INTRAVENOUS
  Filled 2022-05-03 (×7): qty 1

## 2022-05-03 MED ORDER — CHLORHEXIDINE GLUCONATE CLOTH 2 % EX PADS
6.0000 | MEDICATED_PAD | Freq: Every day | CUTANEOUS | Status: DC
  Administered 2022-05-03 – 2022-05-05 (×3): 6 via TOPICAL

## 2022-05-03 NOTE — Progress Notes (Signed)
Patient ID: Susan [name], female   DOB: [birth year], 71 y.o.   MRN: [id number] Patient is postoperative day 1 right transtibial amputation.  There is no drainage in the wound VAC canister.  Anticipate discharge to skilled nursing and anticipate vascular intervention for the seroma left forearm.

## 2022-05-03 NOTE — Progress Notes (Addendum)
Pre-tx hep B status verified with outpt clinic [facility] RN HbAg neg 04/10/22...will fax to unit.

## 2022-05-03 NOTE — Progress Notes (Signed)
Pharmacy Antibiotic Note  Susan [name] is a 71 y.o. female admitted on 05/02/2022 with  wound infection  2/2 to infected fistula.  Pharmacy has been consulted for ceftazidime and vanco dosing.  Plan: Ceftazidime 1 gram iv daily (given after HD on HD days) Vanco 1500 mg iv as load f/b 500 mg iv q tuesday, Thursday, Saturday after HD  Height: '5\' 5"'$  (165.1 cm) Weight: 113.9 kg (251 lb) IBW/kg (Calculated) : 57  Temp (24hrs), Avg:98.4 F (36.9 C), Min:97.7 F (36.5 C), Max:100.9 F (38.3 C)  Recent Labs  Lab 05/02/22 0847 05/02/22 0849 05/03/22 0308  WBC  --  11.9* 15.3*  CREATININE 3.00* 2.93* 3.62*    Estimated Creatinine Clearance: 17.7 mL/min (A) (by C-G formula based on SCr of 3.62 mg/dL (H)).    Allergies  Allergen Reactions   Keflex [Cephalexin] Diarrhea   Codeine Nausea And Vomiting and Other (See Comments)    Antimicrobials this admission: Ceftazidime 12/9 >>   vanco 12/9 >>    Dose adjustments this admission: Doses of ceftazidime and vanco have been adjusted for renal function   Thank you for allowing pharmacy to be a part of this patient's care.  Vaughan Basta BS, PharmD, BCPS Clinical Pharmacist 05/03/2022 10:46 AM  Contact: [phone] after 3 PM  "Be curious, not judgmental..." -Jamal Maes

## 2022-05-03 NOTE — Progress Notes (Signed)
OT Cancellation Note  Patient Details Name: Susan [name] MRN: [id number] DOB: [birth year]   Cancelled Treatment:    Reason Eval/Treat Not Completed: Patient at procedure or test/ unavailable Patient at HD, OT will follow up with evaluation as time permits.   Corinne Ports E. German Manke, OTR/L Acute Rehabilitation Services [phone]   Ascencion Dike 05/03/2022, 10:20 AM

## 2022-05-03 NOTE — Progress Notes (Signed)
Inpatient Rehab Admissions Coordinator:   CIR consult received. Await PT/OT evals to determine candidacy.   Clemens Catholic, [place], [place] Admissions Coordinator  [phone] ([place]) [phone] (office)

## 2022-05-03 NOTE — Progress Notes (Signed)
Paged Dr Johnney Ou to validate updated dialysis orders and dialysate bath, pt potassium level 3.4 this am, 3.4 calls for a 3 potassium bath per dialysis protocol. Md states to always follow instructions of updated orders for dialysis treatments. Will proceed with 4 potassium bath per Dr Johnney Ou written order and verbal instruction

## 2022-05-03 NOTE — Progress Notes (Signed)
MD aware. Patient at baseline. No new interventions needed.

## 2022-05-03 NOTE — Plan of Care (Signed)
  Problem: Coping: Goal: Ability to adjust to condition or change in health will improve Outcome: Progressing   

## 2022-05-03 NOTE — Progress Notes (Signed)
Delay in initiating hd procedure:  providing care to another pt with htn urgency and resp distress

## 2022-05-03 NOTE — Progress Notes (Signed)
Nephrology PA at bedside, will increase ufr goal to 3000 per PA

## 2022-05-03 NOTE — Progress Notes (Signed)
PT Cancellation Note  Patient Details Name: Susan [name] MRN: [id number] DOB: 1950-08-27   Cancelled Treatment:    Reason Eval/Treat Not Completed: Patient at procedure or test/unavailable. Pt currently off unit for HD. Will check back as schedule allows to initiate PT evaluation.    Thelma Comp 05/03/2022, 10:25 AM  Rolinda Roan, PT, DPT Acute Rehabilitation Services Secure Chat Preferred Office: [phone]

## 2022-05-03 NOTE — Progress Notes (Signed)
Received patient in bed to unit.  Alert and oriented.  Informed consent signed and in chart.   Treatment initiated: 0952 Treatment completed: 1430 Patient tolerated well.  Transported back to the room  Alert, without acute distress.  Hand-off given to patient's nurse.   Access used: catheter Access issues: none  Total UF removed: 3299 ml Medication(s) given: oxycodone, tylenol Post HD VS:  Post HD weight:    [facility]  05/03/22 1200  Vitals  BP 132/62  MAP (mmHg) 83  Pulse Rate 95  ECG Heart Rate 95  Oxygen Therapy  SpO2 98 %  MEWS Score  MEWS Temp 0  MEWS Systolic 0  MEWS Pulse 0  MEWS RR 1  MEWS LOC 0  MEWS Score 1  MEWS Score Color Nyoka Cowden

## 2022-05-03 NOTE — Progress Notes (Signed)
  Progress Note    05/03/2022 11:02 AM 1 Day Post-Op  Subjective: Still having left arm pain  Vitals:   05/03/22 0952 05/03/22 1030  BP: (!) 139/58 (!) 145/58  Pulse: 93 95  Resp: 10 (!) 8  Temp:    SpO2: 100% 99%    Physical Exam: Awake alert oriented Incision looks similar to yesterday with serous fluid draining but persistent erythema  CBC    Component Value Date/Time   WBC 15.3 (H) 05/03/2022 0308   RBC 3.82 (L) 05/03/2022 0308   HGB 9.4 (L) 05/03/2022 0308   HCT 31.5 (L) 05/03/2022 0308   PLT 310 05/03/2022 0308   MCV 82.5 05/03/2022 0308   MCH 24.6 (L) 05/03/2022 0308   MCHC 29.8 (L) 05/03/2022 0308   RDW 17.9 (H) 05/03/2022 0308   LYMPHSABS 0.7 05/02/2022 0849   MONOABS 0.7 05/02/2022 0849   EOSABS 0.3 05/02/2022 0849   BASOSABS 0.0 05/02/2022 0849    BMET    Component Value Date/Time   NA 136 05/03/2022 0308   K 3.4 (L) 05/03/2022 0308   CL 94 (L) 05/03/2022 0308   CO2 28 05/03/2022 0308   GLUCOSE 168 (H) 05/03/2022 0308   GLUCOSE >444 (H) 06/07/2015 1311   BUN 41 (H) 05/03/2022 0308   CREATININE 3.62 (H) 05/03/2022 0308   CREATININE 6.22 (H) 03/26/2022 1620   CALCIUM 8.5 (L) 05/03/2022 0308   GFRNONAA 13 (L) 05/03/2022 0308   GFRNONAA 11 (L) 11/15/2020 1452   GFRAA 13 (L) 11/15/2020 1452    INR    Component Value Date/Time   INR 1.5 (H) 02/01/2022 0726     Intake/Output Summary (Last 24 hours) at 05/03/2022 1102 Last data filed at 05/03/2022 0211 Gross per 24 hour  Intake 1054 ml  Output --  Net 1054 ml     Assessment/plan:  71 y.o. female is s/p amputation right lower extremity with Dr. Sharol Given with what appears to be infected left basilic vein fistula on broad-spectrum antibiotics.  I will reevaluate tomorrow but likely will require operative drainage on Monday in the OR and I discussed this plan with the patient.   Janisse Ghan C. Donzetta Matters, MD [facility] [place] Office: [phone] Pager:  [phone]  05/03/2022 11:02 AM

## 2022-05-03 NOTE — Consult Note (Signed)
[facility] Renal Consultation Note    Indication for Consultation:  Management of ESRD/hemodialysis, anemia, hypertension/volume, and secondary hyperparathyroidism. PCP:  HPI: Susan [name] is a 71 y.o. female with ESRD on HD TTS, CAD, HFrEF, HTN, T2DM, hypothyroidism, A-fib (on amiodarone), Hx CVA, and chronic edema who was admitted for scheduled R BKA for necrotic R heel.  Underwent planned R BKA yesterday. During surgery - was noted to have possible wound infection in L arm stemming from LUE 1st stage BVT on 04/02/22 with expressable clear drainage. VVS consulted, recommends broad spectrum abx for now for presumed infected seroma/cellulitis. Per note, may need washout if does not improve with HD alone.  Seen at onset of HD this AM. Denies CP or dyspnea (although wearing O2). No abdominal pain, N/V/D.  Dialyzes on TTS schedule at [facility] using R sided TDC.   Past Medical History:  Diagnosis Date   Acute MI ([place]) 1999; 2007; 03/05/21   Anemia    hx   Anginal pain (HCC)    Anxiety    ARF (acute renal failure) ([place]) 06/2017   [facility]   Arthritis    "generalized" (03/15/2014)   CAD (coronary artery disease)    MI in 2000 - MI  2007 - treated bare metal stent (no nuclear since then as 9/11)   Carotid artery disease (HCC)    CHF (congestive heart failure) ([place])    HFrEF 03/06/21   Chronic diastolic heart failure ([place])    a) ECHO (08/2013) EF 55-60% and RV function nl b) RHC (08/2013) RA 4, RV 30/5/7, PA 25/10 (16), PCWP 7, Fick CO/CI 6.3/2.7, PVR 1.5 WU, PA 61 and 66%   Daily headache    "~ every other day; since I fell in June" (03/15/2014)   Depression    Dyslipidemia    Dyspnea    uses oxygen qhs via [place]   ESRD (end stage renal disease) ([place])    Dialysis on Tues Thurs Sat   Exertional shortness of breath    History of kidney stones    HTN (hypertension)    Hypothyroidism    Neuropathy    Obesity    Osteoarthritis    PAF (paroxysmal  atrial fibrillation) (HCC)    Peripheral neuropathy    bilateral feet/hands   PONV (postoperative nausea and vomiting)    RBBB (right bundle branch block)    Old   Stroke ([place])    mini strokes   Syncope    likely due to low blood sugar   Tachycardia    Sinus tachycardia   Type II diabetes mellitus (HCC)    Type II, Lamar Sprinkles libre left upper arm. patient has omnipod insulin pump with Novolin R Insulin   Urinary incontinence    Venous insufficiency    Past Surgical History:  Procedure Laterality Date   ABDOMINAL HYSTERECTOMY  1980's   AMPUTATION Right 02/24/2018   Procedure: RIGHT FOOT GREAT TOE AND 2ND TOE AMPUTATION;  Surgeon: Newt Minion, MD;  Location: [place];  Service: Orthopedics;  Laterality: Right;   AMPUTATION Right 04/30/2018   Procedure: RIGHT TRANSMETATARSAL AMPUTATION;  Surgeon: Newt Minion, MD;  Location: [place];  Service: Orthopedics;  Laterality: Right;   AV FISTULA PLACEMENT Left 04/02/2022   Procedure: LEFT ARM ARTERIOVENOUS (AV) FISTULA CREATION;  Surgeon: Serafina Mitchell, MD;  Location: [name];  Service: Vascular;  Laterality: Left;  PERIPHERAL NERVE BLOCK   BIOPSY  05/27/2020   Procedure: BIOPSY;  Surgeon: [name],  DO;  [facility];  Service: Endoscopy;;   CATARACT EXTRACTION, BILATERAL Bilateral ?2013   COLONOSCOPY W/ POLYPECTOMY     COLONOSCOPY WITH PROPOFOL N/A 03/13/2019   Procedure: COLONOSCOPY WITH PROPOFOL;  Surgeon: Jerene Bears, MD;  Location: [place];  Service: Gastroenterology;  Laterality: N/A;   CORONARY ANGIOPLASTY WITH STENT PLACEMENT  1999; 2007   "1 + 1"   ERCP N/A 02/03/2022   Procedure: ENDOSCOPIC RETROGRADE CHOLANGIOPANCREATOGRAPHY (ERCP);  Surgeon: Carol Ada, MD;  Location: [name];  Service: Gastroenterology;  Laterality: N/A;   ESOPHAGOGASTRODUODENOSCOPY (EGD) WITH PROPOFOL N/A 03/13/2019   Procedure: ESOPHAGOGASTRODUODENOSCOPY (EGD) WITH PROPOFOL;  Surgeon: Jerene Bears, MD;  Location: [facility]OSCOPY;   Service: Gastroenterology;  Laterality: N/A;   ESOPHAGOGASTRODUODENOSCOPY (EGD) WITH PROPOFOL N/A 05/27/2020   Procedure: ESOPHAGOGASTRODUODENOSCOPY (EGD) WITH PROPOFOL;  Surgeon: [name], DO;  [facility];  Service: Endoscopy;  Laterality: N/A;   EYE SURGERY Bilateral    lazer   HEMOSTASIS CLIP PLACEMENT  03/13/2019   Procedure: HEMOSTASIS CLIP PLACEMENT;  Surgeon: Jerene Bears, MD;  Location: [place] [facility];  Service: Gastroenterology;;   INSERTION OF DIALYSIS CATHETER Right 04/02/2022   Procedure: INSERTION OF TUNNELED DIALYSIS CATHETER;  Surgeon: Serafina Mitchell, MD;  Location: [place];  Service: Vascular;  Laterality: Right;   KNEE ARTHROSCOPY Left 10/25/2006   POLYPECTOMY  03/13/2019   Procedure: POLYPECTOMY;  Surgeon: Jerene Bears, MD;  Location: [facility] ENDOSCOPY;  Service: Gastroenterology;;   REMOVAL OF STONES  02/03/2022   Procedure: REMOVAL OF STONES;  Surgeon: Carol Ada, MD;  Location: [place];  Service: Gastroenterology;;   RIGHT HEART CATH N/A 07/24/2017   Procedure: RIGHT HEART CATH;  Surgeon: Jolaine Artist, MD;  Location: [place] [facility];  Service: Cardiovascular;  Laterality: N/A;   RIGHT HEART CATHETERIZATION N/A 09/22/2013   Procedure: RIGHT HEART CATH;  Surgeon: Jolaine Artist, MD;  Location: [facility] CATH LAB;  Service: Cardiovascular;  Laterality: N/A;   SHOULDER ARTHROSCOPY WITH OPEN ROTATOR CUFF REPAIR Right 03/14/2014   Procedure: RIGHT SHOULDER ARTHROSCOPY WITH BICEPS RELEASE, OPEN SUBSCAPULA REPAIR, OPEN SUPRASPINATUS REPAIR.;  Surgeon: Meredith Pel, MD;  Location: [place];  Service: Orthopedics;  Laterality: Right;   SPHINCTEROTOMY  02/03/2022   Procedure: SPHINCTEROTOMY;  Surgeon: Carol Ada, MD;  Location: [place];  Service: Gastroenterology;;   TEE WITHOUT CARDIOVERSION N/A 02/04/2022   Procedure: TRANSESOPHAGEAL ECHOCARDIOGRAM (TEE);  Surgeon: Jolaine Artist, MD;  Location: [facility] ENDOSCOPY;  Service: Cardiovascular;   Laterality: N/A;   TOE AMPUTATION Right 02/24/2018   GREAT TOE AND 2ND TOE AMPUTATION   TUBAL LIGATION  16's   Family History  Problem Relation Age of Onset   Heart attack Mother 107   Social History:  reports that she quit smoking about 24 years ago. Her smoking use included cigarettes. She has a 96.00 pack-year smoking history. She has never used smokeless tobacco. She reports that she does not currently use alcohol. She reports that she does not use drugs.  ROS: As per HPI otherwise negative.  Physical Exam: Vitals:   05/03/22 0414 05/03/22 0910 05/03/22 0933 05/03/22 0952  BP: (!) 106/48 (!) 130/55 137/66 (!) 139/58  Pulse: 87 94 96 93  Resp: _0 Temp: 98.2 F (36.8 C) 98.5 F (36.9 C) 98.1 F (36.7 C)   TempSrc:  Oral Oral   SpO2: 91% 99% 100% 100%  Weight:      Height:  General: Well developed, well nourished, in no acute distress. Nasal O2 in place. Head: Normocephalic, atraumatic, sclera non-icteric, mucus membranes are moist. Neck: Supple without lymphadenopathy/masses. JVD not elevated. Lungs: Clear bilaterally to auscultation without wheezes, rales, or rhonchi.  Heart: RRR with normal S1, S2. No murmurs, rubs, or gallops appreciated. Abdomen: Soft, distended, non-tender Extremities: R BKA in hard brace, LLE wrapped - 3+ pitting edema in thigh/flank and L arm Neuro: Alert and oriented X 3. Moves all extremities spontaneously. Psych:  Responds to questions appropriately with a normal affect. Dialysis Access: TDC in R chest, LUE AVF + bruit, incisional erythema/warmth  Allergies  Allergen Reactions   Keflex [Cephalexin] Diarrhea   Codeine Nausea And Vomiting and Other (See Comments)   Prior to Admission medications   Medication Sig Start Date End Date Taking? Authorizing Provider  albuterol (VENTOLIN HFA) 108 (90 Base) MCG/ACT inhaler Inhale 1-2 puffs into the lungs every 6 (six) hours as needed for wheezing or shortness of breath.   Yes  [provider]  amiodarone (PACERONE) 200 MG tablet Take 1 tablet (200 mg total) by mouth daily. 01/29/22  Yes Milford, Maricela Bo, FNP  apixaban (ELIQUIS) 5 MG TABS tablet Take 1 tablet (5 mg total) by mouth 2 (two) times daily. 01/29/22  Yes Milford, Maricela Bo, FNP  aspirin EC 81 MG tablet Take 1 tablet (81 mg total) by mouth daily with breakfast. 05/28/20  Yes Emokpae, Courage, MD  B Complex-C-Folic Acid (DIALYVITE TABLET) TABS Take 1 tablet by mouth daily. 04/22/22  Yes [provider]  diphenoxylate-atropine (LOMOTIL) 2.5-0.025 MG tablet Take 1 tablet by mouth 4 (four) times daily as needed for diarrhea or loose stools. 08/19/21  Yes [name], MD  ferrous sulfate 325 (65 FE) MG tablet Take 325 mg by mouth 2 (two) times daily.   Yes [provider]  fidaxomicin (DIFICID) 200 MG TABS tablet Take 1 tablet (200 mg total) by mouth 2 (two) times daily. 03/28/22  Yes Rosiland Oz, MD  hydrALAZINE (APRESOLINE) 25 MG tablet Take 1 tablet (25 mg total) by mouth every 6 (six) hours as needed (SBP>150 or DBP>100). 04/06/22  Yes Nita Sells, MD  Insulin Human (INSULIN PUMP) SOLN Inject 1 each into the skin 3 times daily with meals, bedtime and 2 AM. 04/06/22  Yes Samtani, Jai-Gurmukh, MD  insulin regular (NOVOLIN R RELION) 100 units/mL injection Inject into the skin See admin instructions. Via Insulin Pump   Yes [provider]  levothyroxine (SYNTHROID, LEVOTHROID) 50 MCG tablet Take 1 tablet (50 mcg total) by mouth daily before breakfast. 11/07/16  Yes Dena Billet B, PA-C  linagliptin (TRADJENTA) 5 MG TABS tablet Take 1 tablet (5 mg total) by mouth daily. 04/07/22  Yes Nita Sells, MD  magnesium oxide (MAG-OX) 400 (240 Mg) MG tablet Take 1 tablet (400 mg total) by mouth daily. 07/18/21  Yes [name], MD  oxyCODONE-acetaminophen (PERCOCET/ROXICET) 5-325 MG tablet Take 1 tablet by mouth every 4 (four) hours as needed for severe pain. 04/29/22   Yes Newt Minion, MD  OXYGEN Inhale 2 L/min into the lungs at bedtime.   Yes [provider]  pregabalin (LYRICA) 75 MG capsule Take 1 capsule (75 mg total) by mouth daily. 04/07/22  Yes Nita Sells, MD  sevelamer carbonate (RENVELA) 800 MG tablet Take 1,600 mg by mouth 3 (three) times daily with meals.   Yes [provider]  vortioxetine HBr (TRINTELLIX) 10 MG TABS tablet Take 2 tablets (20 mg total) by  mouth daily. 04/23/22  Yes [name], MD  blood glucose meter kit and supplies Use up to four times daily as directed. 01/23/22   Ghimire, Henreitta Leber, MD  Continuous Blood Gluc Sensor (FREESTYLE LIBRE 2 SENSOR) MISC  01/14/22   [provider]  Fingerstix Lancets MISC Use as directed to check blood sugars as need up to 4 times daily 01/23/22   Ghimire, Henreitta Leber, MD  Insulin Pen Needle 32G X 4 MM MISC Use as directed 01/23/22   Jonetta Osgood, MD   Current Facility-Administered Medications  Medication Dose Route Frequency Provider Last Rate Last Admin   acetaminophen (TYLENOL) tablet 325-650 mg  325-650 mg Oral Q6H PRN Newt Minion, MD       albuterol (PROVENTIL) (2.5 MG/3ML) 0.083% nebulizer solution 3 mL  3 mL Inhalation Q6H PRN Newt Minion, MD       alum & mag hydroxide-simeth (MAALOX/MYLANTA) 200-200-20 MG/5ML suspension 15-30 mL  15-30 mL Oral Q2H PRN Newt Minion, MD       amiodarone (PACERONE) tablet 200 mg  200 mg Oral Daily Newt Minion, MD   200 mg at 05/02/22 1618   apixaban (ELIQUIS) tablet 5 mg  5 mg Oral BID Newt Minion, MD   5 mg at 05/02/22 2205   ascorbic acid (VITAMIN C) tablet 1,000 mg  1,000 mg Oral Daily Newt Minion, MD       aspirin EC tablet 81 mg  81 mg Oral Q breakfast Newt Minion, MD       bisacodyl (DULCOLAX) EC tablet 5 mg  5 mg Oral Daily PRN Newt Minion, MD       Chlorhexidine Gluconate Cloth 2 % PADS 6 each  6 each Topical Daily Newt Minion, MD       Chlorhexidine Gluconate Cloth 2 % PADS 6 each   6 each Topical Q0600 Justin Mend, MD       docusate sodium (COLACE) capsule 100 mg  100 mg Oral Daily Newt Minion, MD       guaiFENesin-dextromethorphan (ROBITUSSIN DM) 100-10 MG/5ML syrup 15 mL  15 mL Oral Q4H PRN Newt Minion, MD       hydrALAZINE (APRESOLINE) injection 5 mg  5 mg Intravenous Q20 Min PRN Newt Minion, MD       hydrALAZINE (APRESOLINE) tablet 25 mg  25 mg Oral Q6H PRN Newt Minion, MD       HYDROmorphone (DILAUDID) injection 0.5-1 mg  0.5-1 mg Intravenous Q4H PRN Newt Minion, MD   1 mg at 05/03/22 0653   insulin aspart (novoLOG) injection 0-5 Units  0-5 Units Subcutaneous QHS Newt Minion, MD       insulin aspart (novoLOG) injection 0-9 Units  0-9 Units Subcutaneous TID WC Newt Minion, MD   2 Units at 05/02/22 1704   insulin aspart (novoLOG) injection 3 Units  3 Units Subcutaneous TID WC Newt Minion, MD   3 Units at 05/02/22 1704   insulin glargine-yfgn (SEMGLEE) injection 28 Units  28 Units Subcutaneous Daily Newt Minion, MD   28 Units at 05/02/22 1721   labetalol (NORMODYNE) injection 10 mg  10 mg Intravenous Q10 min PRN Newt Minion, MD       levothyroxine (SYNTHROID) tablet 50 mcg  50 mcg Oral Q0600 Newt Minion, MD   50 mcg at 05/03/22 0507   linagliptin (TRADJENTA) tablet 5 mg  5 mg Oral  Daily Newt Minion, MD   5 mg at 05/02/22 1619   magnesium citrate solution 1 Bottle  1 Bottle Oral Once PRN Newt Minion, MD       metoprolol tartrate (LOPRESSOR) injection 2-5 mg  2-5 mg Intravenous Q2H PRN Newt Minion, MD       nutrition supplement (JUVEN) (JUVEN) powder packet 1 packet  1 packet Oral BID BM Newt Minion, MD   1 packet at 05/02/22 1619   oxyCODONE (Oxy IR/ROXICODONE) immediate release tablet 10-15 mg  10-15 mg Oral Q4H PRN Newt Minion, MD   15 mg at 05/03/22 0932   oxyCODONE (Oxy IR/ROXICODONE) immediate release tablet 5-10 mg  5-10 mg Oral Q4H PRN Newt Minion, MD   10 mg at 05/02/22 1851   pantoprazole (PROTONIX) EC tablet  40 mg  40 mg Oral Daily Newt Minion, MD       phenol (CHLORASEPTIC) mouth spray 1 spray  1 spray Mouth/Throat PRN Newt Minion, MD       polyethylene glycol (MIRALAX / GLYCOLAX) packet 17 g  17 g Oral Daily PRN Newt Minion, MD       pregabalin (LYRICA) capsule 75 mg  75 mg Oral Daily Newt Minion, MD   75 mg at 05/02/22 1618   sevelamer carbonate (RENVELA) tablet 1,600 mg  1,600 mg Oral TID WC Newt Minion, MD   1,600 mg at 05/02/22 1618   vortioxetine HBr (TRINTELLIX) tablet 20 mg  20 mg Oral Daily Newt Minion, MD   20 mg at 05/02/22 1618   zinc sulfate capsule 220 mg  220 mg Oral Daily Newt Minion, MD       Labs: Basic Metabolic Panel: Recent Labs  Lab 05/02/22 0847 05/02/22 0849 05/03/22 0308  NA 134* 134* 136  K 2.9* 2.9* 3.4*  CL 94* 93* 94*  CO2  --  30 28  GLUCOSE 290* 282* 168*  BUN 30* 28* 41*  CREATININE 3.00* 2.93* 3.62*  CALCIUM  --  8.4* 8.5*  PHOS  --   --  3.6   Liver Function Tests: Recent Labs  Lab 05/02/22 0849  AST 12*  ALT 10  ALKPHOS 162*  BILITOT 0.4  PROT 5.9*  ALBUMIN 2.0*   CBC: Recent Labs  Lab 05/02/22 0847 05/02/22 0849 05/03/22 0308  WBC  --  11.9* 15.3*  NEUTROABS  --  10.0*  --   HGB 10.2* 8.7* 9.4*  HCT 30.0* 30.1* 31.5*  MCV  --  81.4 82.5  PLT  --  344 310   CBG: Recent Labs  Lab 05/02/22 1055 05/02/22 1250 05/02/22 1655 05/02/22 2036 05/03/22 0908  GLUCAP 268* 219* 153* 131* 149*   Dialysis Orders:  TTS at [facility] - fairly new, just started HD 03/2022 4hr, 400/600, EDW 113.5kg, 2K/2Ca, TDC, heparin 2000 unit bolus - Mircera 136mg IV q 2 weeks (last given 11/30) - Venofer 1034mx 10 ordered - Calcitriol 0.4m38mPO q HD  Assessment/Plan:  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 DudSharol GivenUE AVF wound infection: Erythema/warmth and serous drainage. VVS has seen her - recommended broad spectrum abx -> I consulted pharmacy for Vancomycin + Ceftaz dosing - Keflex listed as "allergy" in her chart with  diarrhea as SE -> defer to pharmacy, but [place] be [place].  ESRD:  Continue HD on usual TTS schedule - HD today, 3L UFG, 4K bath for hypokalemia.  Hypertension/volume: BP stable, chronic/severe overload, 3L UFG today -> dry weight has been challenged down as outpatient, will continue this.  Anemia: Hgb 9.4 - not due for ESA yet  Metabolic bone disease: Ca/Phos ok - no home binders, continue VDRA.  Nutrition:  Alb low, adding supplements.  T2DM  A-fib  Veneta Penton, PA-C 05/03/2022, 10:16 AM  [employer]

## 2022-05-04 LAB — BASIC METABOLIC PANEL
Anion gap: 14 (ref 5–15)
BUN: 33 mg/dL — ABNORMAL HIGH (ref 8–23)
CO2: 28 mmol/L (ref 22–32)
Calcium: 8.5 mg/dL — ABNORMAL LOW (ref 8.9–10.3)
Chloride: 93 mmol/L — ABNORMAL LOW (ref 98–111)
Creatinine, Ser: 2.84 mg/dL — ABNORMAL HIGH (ref 0.44–1.00)
GFR, Estimated: 17 mL/min — ABNORMAL LOW (ref >60)
Glucose, Bld: 99 mg/dL (ref 70–99)
Potassium: 4 mmol/L (ref 3.5–5.1)
Sodium: 135 mmol/L (ref 135–145)

## 2022-05-04 LAB — GLUCOSE, CAPILLARY
Glucose-Capillary: 96 mg/dL (ref 70–99)
Glucose-Capillary: 115 mg/dL — ABNORMAL HIGH (ref 70–99)
Glucose-Capillary: 114 mg/dL — ABNORMAL HIGH (ref 70–99)
Glucose-Capillary: 160 mg/dL — ABNORMAL HIGH (ref 70–99)

## 2022-05-04 LAB — CBC
HCT: 32.1 % — ABNORMAL LOW (ref 36.0–46.0)
Hemoglobin: 9.6 g/dL — ABNORMAL LOW (ref 12.0–15.0)
MCH: 24.8 pg — ABNORMAL LOW (ref 26.0–34.0)
MCHC: 29.9 g/dL — ABNORMAL LOW (ref 30.0–36.0)
MCV: 82.9 fL (ref 80.0–100.0)
Platelets: 257 10*3/uL (ref 150–400)
RBC: 3.87 MIL/uL (ref 3.87–5.11)
RDW: 18.9 % — ABNORMAL HIGH (ref 11.5–15.5)
WBC: 13.6 10*3/uL — ABNORMAL HIGH (ref 4.0–10.5)
nRBC: 0 % (ref 0.0–0.2)

## 2022-05-04 MED ORDER — BACITRACIN ZINC 500 UNIT/GM EX OINT
TOPICAL_OINTMENT | Freq: Every day | CUTANEOUS | Status: AC
  Administered 2022-05-04 – 2022-05-08 (×2): 1 via TOPICAL
  Filled 2022-05-04: qty 28.4

## 2022-05-04 MED ORDER — GERHARDT'S BUTT CREAM
TOPICAL_CREAM | Freq: Three times a day (TID) | CUTANEOUS | Status: DC
  Administered 2022-05-08: 1 via TOPICAL
  Filled 2022-05-04: qty 1

## 2022-05-04 NOTE — Evaluation (Addendum)
Physical Therapy Evaluation Patient Details Name: Susan [name] MRN: [id number] DOB: 1951-04-14 Today's Date: 05/04/2022  History of Present Illness  Pt is a 71 y.o. female admitted 05/02/22 with R heel gangrene. 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 PMH includes recent admission 03/2022 with R heel drainage AKI; other PMH includes DM2, CHF, HTN, CAD, CKD, MI, BLE venous insufficiency, anxiety.   Clinical Impression  Pt presents with an overall decrease in functional mobility secondary to above. PTA, pt requiring assist from husband for bed mobility and transfers to rollator seat, husband pushes pt around though she typically stays in bed. Today, pt requiring maxA+2 to [place] for bed mobility, limited by significant anxiety related to anticipation of pain and fear of falling. Pt tolerated prolonged seated EOB activity with max encouragement. Initiated educ re: precautions, positioning, R residual limb ROM and importance of mobility. Pt would benefit from SNF-level therapies to maximize functional mobility and decrease caregiver burden. Will follow acutely to address established goals.   Recommendations for follow up therapy are one component of a multi-disciplinary discharge planning process, led by the attending physician.  Recommendations may be updated based on patient status, additional functional criteria and insurance authorization.  Follow Up Recommendations Skilled nursing-short term rehab (<3 hours/day) Can patient physically be transported by private vehicle: No    Assistance Recommended at Discharge Intermittent Supervision/Assistance  Patient can return home with the following  Two people to help with walking and/or transfers;Two people to help with bathing/dressing/bathroom;Assistance with cooking/housework;Assist for transportation;Help with stairs or ramp for entrance    Equipment Recommendations Bariatric wheelchair, hoyer lift, hospital bed  Recommendations for Other Services       Functional  Status Assessment Patient has had a recent decline in their functional status and demonstrates the ability to make significant improvements in function in a reasonable and predictable amount of time.     Precautions / Restrictions Precautions Precautions: Fall;Other (comment) Precaution Comments: R BKA wound vac; urine incontinence; decreased skin integrity; only wears O2 [place] at night but adamantly requesting this session secondary to "I can't breathe" Required Braces or Orthoses: Other Brace Other Brace: R BKA limb guard Restrictions Weight Bearing Restrictions: Yes RLE Weight Bearing: Non weight bearing      Mobility  Bed Mobility Overal bed mobility: Needs Assistance Bed Mobility: Supine to Sit, Sit to Supine, Rolling Rolling: Mod assist, +2 for physical assistance   Supine to sit: Max assist, +2 for physical assistance, HOB elevated Sit to supine: Total assist, +2 for physical assistance   General bed mobility comments: maxA+2 for BLE management and trunk elevation to sit EOB, multimodal cues for sequencing as pt limited by pain and anxiety, pt able to assist with RUE on L bed rail; totalA for return to supine. modA+2 to roll R/L for pericare and linen change due to urine incontinence    Transfers                   General transfer comment: pt declines, limited by pain and anxiety; will require maximove lift OOB    Ambulation/Gait                  Stairs            Wheelchair Mobility    Modified Rankin (Stroke Patients Only)       Balance Overall balance assessment: Needs assistance   Sitting balance-Leahy Scale: Fair Sitting balance - Comments: preference for external assist and UE support secondary to significant anxiety and fear of  falling, but able to maintain static sitting balance and scoot self forward without assist                                     Pertinent Vitals/Pain Pain Assessment Pain Assessment: Faces Faces  Pain Scale: Hurts whole lot Pain Location: R residual limb Pain Descriptors / Indicators: Cramping, Grimacing, Guarding, Moaning Pain Intervention(s): Limited activity within patient's tolerance, Repositioned    Home Living Family/patient expects to be discharged to:: Private residence Living Arrangements: Spouse/significant other Available Help at Discharge: Family;Available PRN/intermittently Type of Home: House Home Access: Ramped entrance       Home Layout: One level Home Equipment: Grab bars - tub/shower;Rolling Walker (2 wheels);Rollator (4 wheels);BSC/3in1;Cane - quad;Shower seat      Prior Function Prior Level of Function : Needs assist       Physical Assist : Mobility (physical);ADLs (physical)     Mobility Comments: requires assist from husband for bed mobility and heavy assist for transfers with rollator; reports husband pushes her around in rollator. primarily sedentary in bed or sitting in kitchen ADLs Comments: Requires assist from husband for majority of ADLs; pt typically wears Depends, husband assists with pericare/washup at bed level     Hand Dominance   Dominant Hand: Right    Extremity/Trunk Assessment   Upper Extremity Assessment Upper Extremity Assessment: Generalized weakness;LUE deficits/detail LUE Deficits / Details: noted redness/swelling/drainage, pt reports plans for procedure to address tomorrow    Lower Extremity Assessment Lower Extremity Assessment: RLE deficits/detail RLE Deficits / Details: s/p R BKA; pt with some hip activation noted though significant anxiety related to any RLE mobility with poor tolerance for AROM/PROM       Communication   Communication: No difficulties  Cognition Arousal/Alertness: Awake/alert Behavior During Therapy: Anxious Overall Cognitive Status: Impaired/Different from baseline Area of Impairment: Attention, Memory, Following commands, Problem solving, Safety/judgement, Awareness                    Current Attention Level: Sustained Memory: Decreased short-term memory Following Commands: Follows one step commands with increased time Safety/Judgement: Decreased awareness of deficits, Decreased awareness of safety Awareness: Emergent Problem Solving: Slow processing, Decreased initiation, Difficulty sequencing, Requires verbal cues, Requires tactile cues General Comments: pt with significant anxiety, especially regarding anticipation of pain. multimodal cues for all mobility. pt laying in bed at end of session and asks, "Am I in the bed?"        General Comments General comments (skin integrity, edema, etc.): initiated post-op amputation education regarding positioning, AROM and limb guard wear - pt very internally distracted regarding pain/anxiety and "I can't do it today", suspect poor recall of education. doffed/redonned R BKA limb guard for skin check and wound vac line repositioned for pressure relief. noted suspected wound on L posterior thigh, appears to be wear wound vac line resting underneath thigh while pt laying in bed (RN notified)    Exercises     Assessment/Plan    PT Assessment Patient needs continued PT services  PT Problem List Decreased strength;Decreased range of motion;Decreased activity tolerance;Decreased balance;Decreased mobility;Decreased cognition;Decreased knowledge of use of DME;Decreased safety awareness;Decreased knowledge of precautions;Decreased skin integrity;Pain;Obesity       PT Treatment Interventions DME instruction;Functional mobility training;Therapeutic activities;Therapeutic exercise;Balance training;Patient/family education;Wheelchair mobility training    PT Goals (Current goals can be found in the Care Plan section)  Acute Rehab PT Goals Patient Stated Goal:  decreased pain; "I'm not ready to look at my leg yet" PT Goal Formulation: With patient Time For Goal Achievement: 05/18/22 Potential to Achieve Goals: Fair    Frequency Min  2X/week     Co-evaluation PT/OT/SLP Co-Evaluation/Treatment: Yes Reason for Co-Treatment: For patient/therapist safety;Necessary to address cognition/behavior during functional activity PT goals addressed during session: Mobility/safety with mobility;Balance OT goals addressed during session: ADL's and self-care;Strengthening/ROM       AM-PAC PT "6 Clicks" Mobility  Outcome Measure Help needed turning from your back to your side while in a flat bed without using bedrails?: Total Help needed moving from lying on your back to sitting on the side of a flat bed without using bedrails?: Total Help needed moving to and from a bed to a chair (including a wheelchair)?: Total Help needed standing up from a chair using your arms (e.g., wheelchair or bedside chair)?: Total Help needed to walk in hospital room?: Total Help needed climbing 3-5 steps with a railing? : Total 6 Click Score: 6    End of Session   Activity Tolerance: Patient limited by pain (patient limited by anxiety) Patient left: in bed;with call bell/phone within reach;with bed alarm set Nurse Communication: Mobility status;Other (comment) (urine incontinence, bed change, pericare, L posterior thigh wound) PT Visit Diagnosis: Other abnormalities of gait and mobility (R26.89);Muscle weakness (generalized) (M62.81);Pain Pain - Right/Left: Right Pain - part of body: Leg    Time: 0922-0953 PT Time Calculation (min) (ACUTE ONLY): 31 min   Charges:   PT Evaluation $PT Eval Moderate Complexity: 1 Mod        Mabeline Caras, PT, DPT Acute Rehabilitation Services  Personal: [facility] Office: [place] 05/04/2022, 10:56 AM

## 2022-05-04 NOTE — Progress Notes (Signed)
  Progress Note    05/04/2022 11:02 AM 2 Days Post-Op  Subjective: Feeling some better today still having left arm pain  Vitals:   05/04/22 0508 05/04/22 0840  BP: (!) 115/50 (!) 147/64  Pulse: 94 93  Resp:    Temp:  99.3 F (37.4 C)  SpO2:  (!) 84%    Physical Exam: Awake alert and oriented Incision with persistent erythema I was unable to drain anything this morning  CBC    Component Value Date/Time   WBC 13.6 (H) 05/04/2022 0322   RBC 3.87 05/04/2022 0322   HGB 9.6 (L) 05/04/2022 0322   HCT 32.1 (L) 05/04/2022 0322   PLT 257 05/04/2022 0322   MCV 82.9 05/04/2022 0322   MCH 24.8 (L) 05/04/2022 0322   MCHC 29.9 (L) 05/04/2022 0322   RDW 18.9 (H) 05/04/2022 0322   LYMPHSABS 0.7 05/02/2022 0849   MONOABS 0.7 05/02/2022 0849   EOSABS 0.3 05/02/2022 0849   BASOSABS 0.0 05/02/2022 0849    BMET    Component Value Date/Time   NA 135 05/04/2022 0322   K 4.0 05/04/2022 0322   CL 93 (L) 05/04/2022 0322   CO2 28 05/04/2022 0322   GLUCOSE 99 05/04/2022 0322   GLUCOSE >444 (H) 06/07/2015 1311   BUN 33 (H) 05/04/2022 0322   CREATININE 2.84 (H) 05/04/2022 0322   CREATININE 6.22 (H) 03/26/2022 1620   CALCIUM 8.5 (L) 05/04/2022 0322   GFRNONAA 17 (L) 05/04/2022 0322   GFRNONAA 11 (L) 11/15/2020 1452   GFRAA 13 (L) 11/15/2020 1452    INR    Component Value Date/Time   INR 1.5 (H) 02/01/2022 0726     Intake/Output Summary (Last 24 hours) at 05/04/2022 1102 Last data filed at 05/04/2022 0307 Gross per 24 hour  Intake 402.91 ml  Output --  Net 402.91 ml     Assessment/plan:  71 y.o. female is here with right lower extremity amputation by Dr. Sharol Given with infected left basilic vein on broad-spectrum antibiotics with persistent leukocytosis.  Will plan for operative drainage with Dr. Carlis Abbott tomorrow.  I have discontinued Eliquis in preparation for surgery.   Susan [name] C. Donzetta Matters, MD [facility] Office: [phone] Pager:  [phone]  05/04/2022 11:02 AM

## 2022-05-04 NOTE — Progress Notes (Signed)
Subjective: 2 Days Post-Op Procedure(s) (LRB): RIGHT BELOW KNEE AMPUTATION (Right) Patient reports pain as moderate.    Objective: Vital signs in last 24 hours: Temp:  [98 F (36.7 C)-99.3 F (37.4 C)] 99.3 F (37.4 C) (12/10 0840) Pulse Rate:  [86-98] 93 (12/10 0840) Resp:  [8-20] 20 (12/10 0423) BP: (92-147)/(32-64) 147/64 (12/10 0840) SpO2:  [84 %-100 %] 84 % (12/10 0840)  Intake/Output from previous day: 12/09 0701 - 12/10 0700 In: 402.9 [IV Piggyback:402.9] Out: -  Intake/Output this shift: No intake/output data recorded.  Recent Labs    05/02/22 0847 05/02/22 0849 05/03/22 0308 05/04/22 0322  HGB 10.2* 8.7* 9.4* 9.6*   Recent Labs    05/03/22 0308 05/04/22 0322  WBC 15.3* 13.6*  RBC 3.82* 3.87  HCT 31.5* 32.1*  PLT 310 257   Recent Labs    05/03/22 0308 05/04/22 0322  NA 136 135  K 3.4* 4.0  CL 94* 93*  CO2 28 28  BUN 41* 33*  CREATININE 3.62* 2.84*  GLUCOSE 168* 99  CALCIUM 8.5* 8.5*   No results for input(s): "LABPT", "INR" in the last 72 hours.  Neurologically intact Wound vac in place with a good seal.  No fluid in canister  Assessment/Plan: 2 Days Post-Op Procedure(s) (LRB): RIGHT BELOW KNEE AMPUTATION (Right) Up with therapy NWB RLE ABLA- mild and stable        Aundra Dubin 05/04/2022, 10:01 AM

## 2022-05-04 NOTE — Evaluation (Signed)
Occupational Therapy Evaluation Patient Details Name: Susan [name] MRN: [id number] DOB: 13-Apr-1951 Today's Date: 05/04/2022   History of Present Illness Pt is a 71 y.o. female admitted 05/02/22 with R heel gangrene. 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 PMH includes recent admission 03/2022 with R heel drainage AKI; other PMH includes DM2, CHF, HTN, CAD, CKD, MI, BLE venous insufficiency, anxiety.   Clinical Impression   Pt was assisted for transfers and used a w/c as her primary means of mobility. She self fed and participated in grooming, but was otherwise dependent in all ADLs and IADLs and sedentary. Pt presents with anxiety, fear of falling, generalized weakness and L UE edema and redness. She did not want to visualize her residual limb this visit. Pt requires 2 person assist for bed level mobility. She was able to sit with one hand stabilizing at EOB with supervision and participate in one grooming activity. Will follow acutely. Recommending SNF level rehab. If pt and husband choose to go home, recommend hoyer lift, pt does not want a hospital bed.      Recommendations for follow up therapy are one component of a multi-disciplinary discharge planning process, led by the attending physician.  Recommendations may be updated based on patient status, additional functional criteria and insurance authorization.   Follow Up Recommendations  Skilled nursing-short term rehab (<3 hours/day)     Assistance Recommended at Discharge Frequent or constant Supervision/Assistance  Patient can return home with the following Two people to help with walking and/or transfers;A lot of help with bathing/dressing/bathroom;Assistance with cooking/housework;Direct supervision/assist for medications management;Direct supervision/assist for financial management;Assist for transportation;Help with stairs or ramp for entrance    Functional Status Assessment  Patient has had a recent decline in their functional status and/or demonstrates  limited ability to make significant improvements in function in a reasonable and predictable amount of time  Equipment Recommendations  Other (comment) (hoyer lift)    Recommendations for Other Services       Precautions / Restrictions Precautions Precautions: Fall;Other (comment) Precaution Comments: R BKA wound vac; urine incontinence; decreased skin integrity; only wears O2 [place] at night but adamantly requesting this session secondary to "I can't breathe" Required Braces or Orthoses: Other Brace Other Brace: R BKA limb guard Restrictions Weight Bearing Restrictions: Yes RLE Weight Bearing: Non weight bearing      Mobility Bed Mobility Overal bed mobility: Needs Assistance Bed Mobility: Supine to Sit, Rolling, Sit to Supine Rolling: +2 for physical assistance, Max assist   Supine to sit: +2 for physical assistance, Max assist Sit to supine: +2 for physical assistance, Total assist        Transfers                   General transfer comment: pt will require maximove for OOB to chair      Balance Overall balance assessment: Needs assistance Sitting-balance support: Bilateral upper extremity supported, Single extremity supported Sitting balance-Leahy Scale: Fair                                     ADL either performed or assessed with clinical judgement   ADL Overall ADL's : Needs assistance/impaired Eating/Feeding: Independent;Bed level   Grooming: Brushing hair;Sitting;Supervision/safety                                 General ADL Comments: pt  is dependent in all ADLs and IADLs at baseline     Vision Ability to See in Adequate Light: 0 Adequate       Perception     Praxis      Pertinent Vitals/Pain Pain Assessment Pain Assessment: Faces Faces Pain Scale: Hurts even more Pain Location: R LE Pain Descriptors / Indicators: Cramping Pain Intervention(s): Monitored during session, Repositioned     Hand Dominance  Right   Extremity/Trunk Assessment Upper Extremity Assessment Upper Extremity Assessment: Generalized weakness;LUE deficits/detail LUE Deficits / Details: noted redness/swelling/drainage, pt reports plans for procedure to address tomorrow   Lower Extremity Assessment Lower Extremity Assessment: RLE deficits/detail RLE Deficits / Details: s/p R BKA; pt with some hip activation noted though significant anxiety related to any RLE mobility with poor tolerance for AROM/PROM   Cervical / Trunk Assessment Cervical / Trunk Assessment: Other exceptions Cervical / Trunk Exceptions: weakness, obesity   Communication Communication Communication: No difficulties   Cognition Arousal/Alertness: Awake/alert Behavior During Therapy: Anxious Overall Cognitive Status: Impaired/Different from baseline Area of Impairment: Attention, Memory, Following commands, Problem solving                   Current Attention Level: Sustained Memory: Decreased short-term memory Following Commands: Follows one step commands with increased time (and multimodal cues)     Problem Solving: Slow processing, Decreased initiation, Difficulty sequencing, Requires verbal cues, Requires tactile cues General Comments: pt with significant anxiety     General Comments  initiated post-op amputation education regarding positioning, AROM and limb guard wear - pt very internally distracted regarding pain/anxiety and "I can't do it today", suspect poor recall of education. doffed/redonned R BKA limb guard for skin check and wound vac line repositioned for pressure relief. noted suspected wound on L posterior thigh, appears to be wear wound vac line resting underneath thigh while pt laying in bed (RN notified)    Exercises     Shoulder Instructions      Home Living Family/patient expects to be discharged to:: Private residence Living Arrangements: Spouse/significant other Available Help at Discharge: Family;Available  PRN/intermittently Type of Home: House Home Access: Ramped entrance     Home Layout: One level     Bathroom Shower/Tub: Tub/shower unit;Door   Bathroom Toilet: Handicapped height Bathroom Accessibility:  (uses depends)   Home Equipment: Grab bars - tub/shower;Rolling Walker (2 wheels);Rollator (4 wheels);BSC/3in1;Cane - quad;Shower seat          Prior Functioning/Environment Prior Level of Function : Needs assist       Physical Assist : Mobility (physical);ADLs (physical)     Mobility Comments: requires assist from husband for bed mobility and heavy assist for transfers with rollator; reports husband pushes her around in rollator. primarily sedentary in bed or sitting in kitchen ADLs Comments: Requires assist from husband for majority of ADLs; pt typically wears Depends, husband assists with pericare/washup at bed level        OT Problem List: Decreased strength;Decreased activity tolerance;Impaired balance (sitting and/or standing);Pain;Obesity      OT Treatment/Interventions:      OT Goals(Current goals can be found in the care plan section) Acute Rehab OT Goals OT Goal Formulation: With patient Time For Goal Achievement: 05/18/22 Potential to Achieve Goals: Fair ADL Goals Pt Will Perform Grooming: with set-up;sitting Additional ADL Goal #1: Pt will perform bed mobility with moderate assistance in preparation for ADLs. Additional ADL Goal #2: Pt will participate in seated ADLs x 15 minutes at EOB without UE support.  OT Frequency:      Co-evaluation PT/OT/SLP Co-Evaluation/Treatment: Yes Reason for Co-Treatment: For patient/therapist safety;Necessary to address cognition/behavior during functional activity   OT goals addressed during session: ADL's and self-care;Strengthening/ROM      AM-PAC OT "6 Clicks" Daily Activity     Outcome Measure Help from another person eating meals?: None Help from another person taking care of personal grooming?: A Little Help  from another person toileting, which includes using toliet, bedpan, or urinal?: Total Help from another person bathing (including washing, rinsing, drying)?: Total Help from another person to put on and taking off regular upper body clothing?: A Lot Help from another person to put on and taking off regular lower body clothing?: Total 6 Click Score: 12   End of Session Equipment Utilized During Treatment: Oxygen Nurse Communication: Other (comment);Mobility status (pt with urinary incontinence)  Activity Tolerance: Patient tolerated treatment well Patient left: in bed;with call bell/phone within reach;with bed alarm set  OT Visit Diagnosis: Muscle weakness (generalized) (M62.81);Pain;Other symptoms and signs involving cognitive function                Time: 9024-0973 OT Time Calculation (min): 32 min Charges:  OT General Charges $OT Visit: 1 Visit OT Evaluation $OT Eval Moderate Complexity: [place], OTR/L Acute Rehabilitation Services Office: [phone]  Malka So 05/04/2022, 11:41 AM

## 2022-05-04 NOTE — TOC CM/SW Note (Cosign Needed)
[name] DOB: 1950-09-17 is in need of short term rehab at [facility]. It is anticipated patient will require 30 days or less .

## 2022-05-04 NOTE — NC FL2 (Signed)
[place] LEVEL OF CARE FORM     IDENTIFICATION  Patient Name: Susan [name] Birthdate: Aug 12, 1950 Sex: female Admission Date (Current Location): 05/02/2022  [facility] and [place] Number:  Herbalist and Address:  [facility], [place] [address] [place], [place] [address]      Provider Number: [id number]  Attending Physician Name and Address:  Newt Minion, MD  Relative Name and Phone Number:  Christy, Ehrsam [phone]  [phone]    Current Level of Care: Hospital Recommended Level of Care: [place] Prior Approval Number:    Date Approved/Denied:   PASRR Number:    Discharge Plan: SNF    Current Diagnoses: Patient Active Problem List   Diagnosis Date Noted   Gangrene of right foot ([place]) 05/02/2022   S/P BKA (below knee amputation) unilateral, right ([place]) 05/02/2022   Unspecified protein-calorie malnutrition ([place]) 04/15/2022   Secondary hyperparathyroidism of renal origin ([place]) 04/14/2022   Coagulation defect, unspecified ([place]) 04/09/2022   Acquired absence of other left toe(s) ([place]) 04/07/2022   Allergy, unspecified, initial encounter 04/07/2022   Dependence on renal dialysis ([place]) 04/07/2022   Gout due to renal impairment, unspecified site 04/07/2022   Hypertensive heart and chronic kidney disease with heart failure and with stage 5 chronic kidney disease, or end stage renal disease ([place]) 04/07/2022   Personal history of transient ischemic attack (TIA), and cerebral infarction without residual deficits 04/07/2022   Renal osteodystrophy 04/07/2022   Venous stasis ulcer of right calf ([place]) 03/31/2022   Fistula, colovaginal 03/26/2022   Diarrhea 03/26/2022   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   Sepsis without acute organ dysfunction ([place])    Bacteremia    Acute pancreatitis 02/01/2022   Abdominal pain 02/01/2022   SIRS (systemic inflammatory response syndrome) ([place]) 02/01/2022   Transaminitis  02/01/2022   History of anemia due to chronic kidney disease 02/01/2022   Paroxysmal atrial fibrillation ([place]) 02/01/2022   DKA (diabetic ketoacidosis) ([place]) 01/14/2022   NSTEMI (non-ST elevated myocardial infarction) ([place]) 03/05/2021   Acute renal failure superimposed on stage 4 chronic kidney disease ([place]) 08/22/2020   Hypoalbuminemia 05/25/2020   GERD (gastroesophageal reflux disease) 05/25/2020   Pressure injury of skin 05/17/2020   Type 2 diabetes mellitus with diabetic polyneuropathy, with long-term current use of insulin ([place]) 03/07/2020   Obesity, Class III, BMI 40-49.9 (morbid obesity) ([place]) 03/07/2020   Common bile duct (CBD) obstruction 05/28/2019   Benign neoplasm of ascending colon    Benign neoplasm of transverse colon    Benign neoplasm of descending colon    Benign neoplasm of sigmoid colon    Gastric polyps    Hyperkalemia 03/11/2019   Prolonged QT interval 03/11/2019   Onychomycosis 06/21/2018   Osteomyelitis of second toe of right foot ([place])    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 (HCC)    PVD (peripheral vascular disease) ([place]) 10/26/2017   E-coli UTI 07/27/2017   Hypothyroidism 07/27/2017   AKI (acute kidney injury) ([place])    PAH (pulmonary artery hypertension) (HCC)    Impaired ambulation 07/19/2017   Nausea & vomiting 07/15/2017   Leg cramps 02/27/2017   Peripheral edema 01/12/2017   Diabetic neuropathy ([place]) 11/12/2016   CKD (chronic kidney disease), stage IV ([place]) 10/24/2015   Anemia 10/03/2015   Generalized anxiety disorder 10/03/2015   Insomnia 10/03/2015   Hyperglycemia due to diabetes mellitus ([place]) 06/07/2015   Chronic diastolic CHF (congestive heart failure) ([place]) 06/07/2015   Non compliance with  medical treatment 04/17/2014   Rotator cuff tear 03/14/2014   Class 3 obesity ([place]) 09/23/2013   Chronic HFrEF (heart failure with reduced ejection fraction) ([place]) 06/03/2013   Hypotension 12/25/2012   Urinary incontinence    MDD  (major depressive disorder) 11/12/2010   RBBB (right bundle branch block)    Coronary artery disease    Hyperlipemia 01/22/2009   Essential hypertension 01/22/2009    Orientation RESPIRATION BLADDER Height & Weight     Self, Time, Situation, Place  O2 (2L) Incontinent Weight: 251 lb (113.9 kg) Height:  '5\' 5"'$  (165.1 cm)  BEHAVIORAL SYMPTOMS/MOOD NEUROLOGICAL BOWEL NUTRITION STATUS      Continent Diet (see discharge summary)  AMBULATORY STATUS COMMUNICATION OF NEEDS Skin   Extensive Assist Verbally Wound Vac, Other (Comment) (right knee incision (BKA); left arm incision)                       Personal Care Assistance Level of Assistance  Bathing, Dressing Bathing Assistance: Limited assistance   Dressing Assistance: Limited assistance     Functional Limitations Info             SPECIAL CARE FACTORS FREQUENCY  PT (By licensed PT), OT (By licensed OT)     PT Frequency: 5x/wk OT Frequency: 5x/wk            Contractures Contractures Info: Not present    Additional Factors Info  Code Status, Allergies Code Status Info: DNR Allergies Info: Keflex (cephalexin) ;Codeine           Current Medications (05/04/2022):  This is the current hospital active medication list Current Facility-Administered Medications  Medication Dose Route Frequency Provider Last Rate Last Admin   (feeding supplement) PROSource Plus liquid 30 mL  30 mL Oral BID BM Loren Racer, PA-C   30 mL at 05/04/22 1427   acetaminophen (TYLENOL) tablet 325-650 mg  325-650 mg Oral Q6H PRN Newt Minion, MD   650 mg at 05/03/22 2128   albuterol (PROVENTIL) (2.5 MG/3ML) 0.083% nebulizer solution 3 mL  3 mL Inhalation Q6H PRN Newt Minion, MD       alum & mag hydroxide-simeth (MAALOX/MYLANTA) 200-200-20 MG/5ML suspension 15-30 mL  15-30 mL Oral Q2H PRN Newt Minion, MD       amiodarone (PACERONE) tablet 200 mg  200 mg Oral Daily Newt Minion, MD   200 mg at 05/04/22 3299   ascorbic acid  (VITAMIN C) tablet 1,000 mg  1,000 mg Oral Daily Newt Minion, MD   1,000 mg at 05/04/22 2426   aspirin EC tablet 81 mg  81 mg Oral Q breakfast Newt Minion, MD   81 mg at 05/04/22 8341   bacitracin ointment   Topical Daily Newt Minion, MD   1 Application at 96/22/29 1430   bisacodyl (DULCOLAX) EC tablet 5 mg  5 mg Oral Daily PRN Newt Minion, MD       cefTAZidime (FORTAZ) 1 g in sodium chloride 0.9 % 100 mL IVPB  1 g Intravenous Q24H Reome, Earle J, RPH 200 mL/hr at 05/04/22 1426 1 g at 05/04/22 1426   Chlorhexidine Gluconate Cloth 2 % PADS 6 each  6 each Topical Daily Newt Minion, MD   6 each at 05/04/22 0948   Chlorhexidine Gluconate Cloth 2 % PADS 6 each  6 each Topical Q0600 Justin Mend, MD   6 each at 05/04/22 0546   docusate sodium (COLACE) capsule 100  mg  100 mg Oral Daily Newt Minion, MD   100 mg at 05/04/22 6160   Gerhardt's butt cream   Topical TID Newt Minion, MD   Given at 05/04/22 1433   guaiFENesin-dextromethorphan (ROBITUSSIN DM) 100-10 MG/5ML syrup 15 mL  15 mL Oral Q4H PRN Newt Minion, MD       hydrALAZINE (APRESOLINE) injection 5 mg  5 mg Intravenous Q20 Min PRN Newt Minion, MD       hydrALAZINE (APRESOLINE) tablet 25 mg  25 mg Oral Q6H PRN Newt Minion, MD       HYDROmorphone (DILAUDID) injection 0.5-1 mg  0.5-1 mg Intravenous Q4H PRN Newt Minion, MD   1 mg at 05/04/22 0325   insulin aspart (novoLOG) injection 0-5 Units  0-5 Units Subcutaneous QHS Newt Minion, MD       insulin aspart (novoLOG) injection 0-9 Units  0-9 Units Subcutaneous TID WC Newt Minion, MD   2 Units at 05/03/22 1815   insulin aspart (novoLOG) injection 3 Units  3 Units Subcutaneous TID WC Newt Minion, MD   3 Units at 05/03/22 1815   insulin glargine-yfgn (SEMGLEE) injection 28 Units  28 Units Subcutaneous Daily Newt Minion, MD   28 Units at 05/04/22 0830   labetalol (NORMODYNE) injection 10 mg  10 mg Intravenous Q10 min PRN Newt Minion, MD       levothyroxine  (SYNTHROID) tablet 50 mcg  50 mcg Oral Q0600 Newt Minion, MD   50 mcg at 05/04/22 0546   linagliptin (TRADJENTA) tablet 5 mg  5 mg Oral Daily Newt Minion, MD   5 mg at 05/04/22 7371   magnesium citrate solution 1 Bottle  1 Bottle Oral Once PRN Newt Minion, MD       metoprolol tartrate (LOPRESSOR) injection 2-5 mg  2-5 mg Intravenous Q2H PRN Newt Minion, MD       nutrition supplement (JUVEN) (JUVEN) powder packet 1 packet  1 packet Oral BID BM Newt Minion, MD   1 packet at 05/04/22 1426   oxyCODONE (Oxy IR/ROXICODONE) immediate release tablet 10-15 mg  10-15 mg Oral Q4H PRN Newt Minion, MD   15 mg at 05/04/22 1428   oxyCODONE (Oxy IR/ROXICODONE) immediate release tablet 5-10 mg  5-10 mg Oral Q4H PRN Newt Minion, MD   10 mg at 05/04/22 0830   pantoprazole (PROTONIX) EC tablet 40 mg  40 mg Oral Daily Newt Minion, MD   40 mg at 05/04/22 0828   phenol (CHLORASEPTIC) mouth spray 1 spray  1 spray Mouth/Throat PRN Newt Minion, MD       polyethylene glycol (MIRALAX / GLYCOLAX) packet 17 g  17 g Oral Daily PRN Newt Minion, MD       pregabalin (LYRICA) capsule 75 mg  75 mg Oral Daily Newt Minion, MD   75 mg at 05/04/22 0626   sevelamer carbonate (RENVELA) tablet 1,600 mg  1,600 mg Oral TID WC Newt Minion, MD   1,600 mg at 05/04/22 1427   [START ON 05/06/2022] vancomycin (VANCOREADY) IVPB 500 mg/100 mL  500 mg Intravenous Q T,Th,Sa-HD Reome, Earle J, RPH       vortioxetine HBr (TRINTELLIX) tablet 20 mg  20 mg Oral Daily Newt Minion, MD   20 mg at 05/04/22 9485   zinc sulfate capsule 220 mg  220 mg Oral Daily Newt Minion, MD   220  mg at 05/04/22 1595     Discharge Medications: Please see discharge summary for a list of discharge medications.  Relevant Imaging Results:  Relevant Lab Results:   Additional Information [phone].   [place], LCSWA

## 2022-05-04 NOTE — Consult Note (Signed)
WOC Nurse Consult Note: Reason for Consult:Blisters to LLE above dressing (patient is scratching), moisture associated dermatitis to medial thighs, perineal area and buttocks, left breast with erythema intertrigo and hard mass that this draining Wound type: Pressure Injury POA: N/A Measurement: Bedside RN to provide measurements to Nursing Flow Sheet with next dressing change Wound bed:red, moist Drainage (amount, consistency, odor) scant serous Periwound:intact Dressing procedure/placement/frequency: I will provide guidance for the blisters proximal to the LLE dressing and for the moisture associated skin damage, but have advised Nursing to contact Dr Sharol Given for guidance or consideration of a general surgery referral for the left breast lesion. I will provide guidance for our house antimicrobial moisture wicking textile Estrella Deeds # [phone]) in the inframammary area when perspiration and drainage have collected contributing to a dermatitis. The areas that patient has been scratching above the left LE dressing will be treated with bacitracin ointment for a week and topped with a silicone foam dressing. The perineal, buttock and medial thigh areas of MASD will be addressed with application of Gerhart's Butt cream, a compounded product consisting of 1/3 lotrimin, 1/3 zinc oxide and 1/3 hydrocortisone cream.  WOC nursing team will not follow, but will remain available to this patient, the nursing and medical teams.  Please re-consult if needed.  Thank you for inviting [name] to participate in this patient's Plan of Care.  Maudie Flakes, MSN, RN, CNS, [place], Serita Grammes, [employer], [employer] phone:  [phone]

## 2022-05-04 NOTE — Plan of Care (Signed)
  Problem: Coping: Goal: Ability to adjust to condition or change in health will improve Outcome: Progressing   Problem: Nutritional: Goal: Maintenance of adequate nutrition will improve Outcome: Progressing   Problem: Skin Integrity: Goal: Risk for impaired skin integrity will decrease Outcome: Progressing

## 2022-05-04 NOTE — Progress Notes (Signed)
[facility] Progress Note   Subjective:   had HD yesterday - UF 3.3L. no new issues.    Objective Vitals:   05/03/22 2138 05/04/22 0423 05/04/22 0508 05/04/22 0840  BP: (!) 122/53 (!) 106/32 (!) 115/50 (!) 147/64  Pulse: 97 98 94 93  Resp: 18 20    Temp: 98 F (36.7 C)   99.3 F (37.4 C)  TempSrc: Oral   Oral  SpO2: 95% 95%  99%  Weight:      Height:       Physical Exam General: chronically ill but nontoxic appearing Heart:RRR Lungs: clear Abdomen:soft, obese Extremities: LUE AVF +t/b, erythema has improved some but still very warm and swollen at [facility] fossa Dialysis Access:  [facility] c/d/I  Additional Objective Labs: Basic Metabolic Panel: Recent Labs  Lab 05/02/22 0849 05/03/22 0308 05/04/22 0322  NA 134* 136 135  K 2.9* 3.4* 4.0  CL 93* 94* 93*  CO2 '30 28 28  '$ GLUCOSE 282* 168* 99  BUN 28* 41* 33*  CREATININE 2.93* 3.62* 2.84*  CALCIUM 8.4* 8.5* 8.5*  PHOS  --  3.6  --    Liver Function Tests: Recent Labs  Lab 05/02/22 0849  AST 12*  ALT 10  ALKPHOS 162*  BILITOT 0.4  PROT 5.9*  ALBUMIN 2.0*   No results for input(s): "LIPASE", "AMYLASE" in the last 168 hours. CBC: Recent Labs  Lab 05/02/22 0849 05/03/22 0308 05/04/22 0322  WBC 11.9* 15.3* 13.6*  NEUTROABS 10.0*  --   --   HGB 8.7* 9.4* 9.6*  HCT 30.1* 31.5* 32.1*  MCV 81.4 82.5 82.9  PLT 344 310 257   Blood Culture    Component Value Date/Time   SDES BLOOD RIGHT FOREARM 02/03/2022 0945   SPECREQUEST  02/03/2022 0945    BOTTLES DRAWN AEROBIC AND ANAEROBIC Blood Culture adequate volume   CULT  02/03/2022 0945    NO GROWTH 5 DAYS Performed at [facility], [place] [address], [place], [place] [address]    REPTSTATUS 02/08/2022 FINAL 02/03/2022 0945    Cardiac Enzymes: No results for input(s): "CKTOTAL", "CKMB", "CKMBINDEX", "TROPONINI" in the last 168 hours. CBG: Recent Labs  Lab 05/03/22 1356 05/03/22 1649 05/03/22 2004 05/04/22 0805 05/04/22 1152  GLUCAP  114* 187* 164* 96 115*   Iron Studies: No results for input(s): "IRON", "TIBC", "TRANSFERRIN", "FERRITIN" in the last 72 hours. '@lablastinr3'$ @ Studies/Results: No results found. Medications:  cefTAZidime (FORTAZ)  IV Stopped (05/03/22 1849)   [START ON 05/06/2022] vancomycin      (feeding supplement) PROSource Plus  30 mL Oral BID BM   amiodarone  200 mg Oral Daily   vitamin C  1,000 mg Oral Daily   aspirin EC  81 mg Oral Q breakfast   bacitracin   Topical Daily   Chlorhexidine Gluconate Cloth  6 each Topical Daily   Chlorhexidine Gluconate Cloth  6 each Topical Q0600   docusate sodium  100 mg Oral Daily   Gerhardt's butt cream   Topical TID   insulin aspart  0-5 Units Subcutaneous QHS   insulin aspart  0-9 Units Subcutaneous TID WC   insulin aspart  3 Units Subcutaneous TID WC   insulin glargine-yfgn  28 Units Subcutaneous Daily   levothyroxine  50 mcg Oral Q0600   linagliptin  5 mg Oral Daily   nutrition supplement (JUVEN)  1 packet Oral BID BM   pantoprazole  40 mg Oral Daily   pregabalin  75 mg Oral Daily  sevelamer carbonate  1,600 mg Oral TID WC   vortioxetine HBr  20 mg Oral Daily   zinc sulfate  220 mg Oral Daily    Dialysis Orders:  TTS at [facility] - fairly new, just started HD 03/2022 4hr, 400/600, EDW 113.5kg, 2K/2Ca, TDC, heparin 2000 unit bolus - Mircera 19mg IV q 2 weeks (last given 11/30) - Venofer '100mg'$  x 10 ordered - Calcitriol 0.564m PO q HD   Assessment/Plan:  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 DuSharol GivenLUE AVF wound infection: Erythema/warmth and serous drainage. VVS following - plan for OR tomorrow for drainage.  On vanc and ceftaz per pharm dosing.   ESRD:  Continue HD on usual TTS schedule - next Tues  Hypertension/volume: BP stable, chronic/severe overload, 3.3L UF 12/9 -> dry weight has been challenged down as outpatient, will continue this.  Anemia: Hgb 9.4 - not due for ESA yet  Metabolic bone disease: Ca/Phos ok - no home binders,  continue VDRA.  Nutrition:  Alb low, added supplements.  T2DM  A-fib - eliquis on hold for surgery 12/11  LiJannifer HickD 05/04/2022, 1:48 PM  [facility] Pager: [phone]

## 2022-05-04 NOTE — TOC Initial Note (Signed)
Transition of Care [facility]) - Initial/Assessment Note    Patient Details  Name: Susan [name] MRN: [id number] Date of Birth: 06-24-1950  [facility] CM/SW Contact:    Ina Homes, [place] Phone Number: 05/04/2022, 3:49 PM  Clinical Narrative:                  SW met with pt and husband at bedside. SW discussed PT recs for SNF. Pt/husband agreeable. Pt reports hx at [facility]. SW provided list of SNF's in pt area. SW explained authorization process. Pt/husband will review for preferred SNFs.  Expected Discharge Plan: Skilled Nursing Facility Barriers to Discharge: Continued Medical Work up, SNF Pending bed offer   Patient Goals and CMS Choice Patient states their goals for this hospitalization and ongoing recovery are:: return home CMS Medicare.gov Compare Post Acute Care list provided to:: Patient Choice offered to / list presented to : Spouse, Patient  Expected Discharge Plan and Services Expected Discharge Plan: [place] Choice: [place] Living arrangements for the past 2 months: Single Family Home                                      Prior Living Arrangements/Services Living arrangements for the past 2 months: Single Family Home Lives with:: Spouse Patient language and need for interpreter reviewed:: Yes Do you feel safe going back to the place where you live?: Yes      Need for Family Participation in Patient Care: Yes (Comment) Care giver support system in place?: Yes (comment)   Criminal Activity/Legal Involvement Pertinent to Current Situation/Hospitalization: No - Comment as needed  Activities of Daily Living Home Assistive Devices/Equipment: Wheelchair, Environmental consultant (specify type), CBG Meter ADL Screening (condition at time of admission) Patient's cognitive ability adequate to safely complete daily activities?: Yes Is the patient deaf or have difficulty hearing?: No Does the patient have  difficulty seeing, even when wearing glasses/contacts?: No Does the patient have difficulty concentrating, remembering, or making decisions?: No Patient able to express need for assistance with ADLs?: Yes Does the patient have difficulty dressing or bathing?: Yes Independently performs ADLs?: Yes (appropriate for developmental age) Does the patient have difficulty walking or climbing stairs?: No Weakness of Legs: Both Weakness of Arms/Hands: Both  Permission Sought/Granted Permission sought to share information with : Facility Sport and exercise psychologist, Family Supports Permission granted to share information with : Yes, Verbal Permission Granted  Share Information with NAME: Percell Miller  Permission granted to share info w AGENCY: SNF  Permission granted to share info w Relationship: Spouse     Emotional Assessment Appearance:: Appears stated age Attitude/Demeanor/Rapport: Engaged Affect (typically observed): Accepting Orientation: : Oriented to Self, Oriented to Place, Oriented to  Time, Oriented to Situation      Admission diagnosis:  S/P BKA (below knee amputation) unilateral, right ([place]) [Z89.511] Patient Active Problem List   Diagnosis Date Noted   Gangrene of right foot ([place]) 05/02/2022   S/P BKA (below knee amputation) unilateral, right ([place]) 05/02/2022   Unspecified protein-calorie malnutrition ([place]) 04/15/2022   Secondary hyperparathyroidism of renal origin ([place]) 04/14/2022   Coagulation defect, unspecified ([place]) 04/09/2022   Acquired absence of other left toe(s) ([place]) 04/07/2022   Allergy, unspecified, initial encounter 04/07/2022   Dependence on renal dialysis ([place]) 04/07/2022   Gout due to renal impairment, unspecified site 04/07/2022   Hypertensive heart and chronic  kidney disease with heart failure and with stage 5 chronic kidney disease, or end stage renal disease ([place]) 04/07/2022   Personal history of transient ischemic attack (TIA), and cerebral infarction without  residual deficits 04/07/2022   Renal osteodystrophy 04/07/2022   Venous stasis ulcer of right calf ([place]) 03/31/2022   Fistula, colovaginal 03/26/2022   Diarrhea 03/26/2022   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   Sepsis without acute organ dysfunction ([place])    Bacteremia    Acute pancreatitis 02/01/2022   Abdominal pain 02/01/2022   SIRS (systemic inflammatory response syndrome) ([place]) 02/01/2022   Transaminitis 02/01/2022   History of anemia due to chronic kidney disease 02/01/2022   Paroxysmal atrial fibrillation ([place]) 02/01/2022   DKA (diabetic ketoacidosis) ([place]) 01/14/2022   NSTEMI (non-ST elevated myocardial infarction) ([place]) 03/05/2021   Acute renal failure superimposed on stage 4 chronic kidney disease ([place]) 08/22/2020   Hypoalbuminemia 05/25/2020   GERD (gastroesophageal reflux disease) 05/25/2020   Pressure injury of skin 05/17/2020   Type 2 diabetes mellitus with diabetic polyneuropathy, with long-term current use of insulin ([place]) 03/07/2020   Obesity, Class III, BMI 40-49.9 (morbid obesity) ([place]) 03/07/2020   Common bile duct (CBD) obstruction 05/28/2019   Benign neoplasm of ascending colon    Benign neoplasm of transverse colon    Benign neoplasm of descending colon    Benign neoplasm of sigmoid colon    Gastric polyps    Hyperkalemia 03/11/2019   Prolonged QT interval 03/11/2019   Onychomycosis 06/21/2018   Osteomyelitis of second toe of right foot ([place])    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 (HCC)    PVD (peripheral vascular disease) ([place]) 10/26/2017   E-coli UTI 07/27/2017   Hypothyroidism 07/27/2017   AKI (acute kidney injury) ([place])    PAH (pulmonary artery hypertension) ([place])    Impaired ambulation 07/19/2017   Nausea & vomiting 07/15/2017   Leg cramps 02/27/2017   Peripheral edema 01/12/2017   Diabetic neuropathy ([place]) 11/12/2016   CKD (chronic kidney disease), stage IV ([place]) 10/24/2015   Anemia 10/03/2015   Generalized anxiety  disorder 10/03/2015   Insomnia 10/03/2015   Hyperglycemia due to diabetes mellitus ([place]) 06/07/2015   Chronic diastolic CHF (congestive heart failure) ([place]) 06/07/2015   Non compliance with medical treatment 04/17/2014   Rotator cuff tear 03/14/2014   Class 3 obesity ([place]) 09/23/2013   Chronic HFrEF (heart failure with reduced ejection fraction) ([place]) 06/03/2013   Hypotension 12/25/2012   Urinary incontinence    MDD (major depressive disorder) 11/12/2010   RBBB (right bundle branch block)    Coronary artery disease    Hyperlipemia 01/22/2009   Essential hypertension 01/22/2009   PCP:  [name], MD Pharmacy:   [id number] [facility] [id number]: [phone]: [phone] [facility] [id number]: [phone]: [phone]    Social Determinants of Health (SDOH) Interventions Housing Interventions: Intervention Not Indicated Transportation Interventions: Intervention Not Indicated Utilities Interventions: Intervention Not Indicated  Readmission Risk Interventions    02/07/2022    2:57 PM 01/23/2022   12:38 PM 05/28/2020   12:50 PM  Readmission Risk Prevention Plan  Transportation Screening Complete Complete Complete  PCP or Specialist Appt within 3-5 Days  Complete   HRI or Home Care Consult  Complete   Social Work Consult for [place]anning/Counseling  Complete  Palliative Care Screening  Not Applicable   Medication Review (RN Care Manager) Complete Complete Complete  PCP or Specialist appointment within 3-5 days of discharge Complete  Complete  HRI or Home Care Consult Complete  Complete  SW Recovery Care/Counseling Consult Complete  Complete  Palliative Care Screening Not Applicable  Not [place] Not Applicable  Not Applicable

## 2022-05-05 ENCOUNTER — Inpatient Hospital Stay (HOSPITAL_COMMUNITY): Payer: PPO | Admitting: Anesthesiology

## 2022-05-05 ENCOUNTER — Encounter (HOSPITAL_COMMUNITY)
Admission: RE | Disposition: A | Discharge status: Rehabilitation Facility | Payer: Self-pay | Source: Ambulatory Visit | Attending: Orthopedic Surgery

## 2022-05-05 ENCOUNTER — Other Ambulatory Visit: Payer: Self-pay

## 2022-05-05 ENCOUNTER — Encounter (HOSPITAL_COMMUNITY): Payer: Self-pay | Admitting: Orthopedic Surgery

## 2022-05-05 DIAGNOSIS — I252 Old myocardial infarction: Secondary | ICD-10-CM | POA: Diagnosis not present

## 2022-05-05 DIAGNOSIS — I25119 Atherosclerotic heart disease of native coronary artery with unspecified angina pectoris: Secondary | ICD-10-CM

## 2022-05-05 DIAGNOSIS — I509 Heart failure, unspecified: Secondary | ICD-10-CM

## 2022-05-05 DIAGNOSIS — I132 Hypertensive heart and chronic kidney disease with heart failure and with stage 5 chronic kidney disease, or end stage renal disease: Secondary | ICD-10-CM

## 2022-05-05 DIAGNOSIS — T82898A Other specified complication of vascular prosthetic devices, implants and grafts, initial encounter: Secondary | ICD-10-CM

## 2022-05-05 DIAGNOSIS — Z992 Dependence on renal dialysis: Secondary | ICD-10-CM

## 2022-05-05 DIAGNOSIS — N185 Chronic kidney disease, stage 5: Secondary | ICD-10-CM

## 2022-05-05 DIAGNOSIS — T827XXA Infection and inflammatory reaction due to other cardiac and vascular devices, implants and grafts, initial encounter: Secondary | ICD-10-CM

## 2022-05-05 DIAGNOSIS — N186 End stage renal disease: Secondary | ICD-10-CM

## 2022-05-05 DIAGNOSIS — Z87891 Personal history of nicotine dependence: Secondary | ICD-10-CM

## 2022-05-05 HISTORY — PX: I & D EXTREMITY: SHX5045

## 2022-05-05 LAB — POCT I-STAT, CHEM 8
BUN: 57 mg/dL — ABNORMAL HIGH (ref 8–23)
Calcium, Ion: 0.85 mmol/L — CL (ref 1.15–1.40)
Chloride: 98 mmol/L (ref 98–111)
Creatinine, Ser: 4.1 mg/dL — ABNORMAL HIGH (ref 0.44–1.00)
Glucose, Bld: 261 mg/dL — ABNORMAL HIGH (ref 70–99)
HCT: 27 % — ABNORMAL LOW (ref 36.0–46.0)
Hemoglobin: 9.2 g/dL — ABNORMAL LOW (ref 12.0–15.0)
Potassium: 4.1 mmol/L (ref 3.5–5.1)
Sodium: 126 mmol/L — ABNORMAL LOW (ref 135–145)
TCO2: 25 mmol/L (ref 22–32)

## 2022-05-05 LAB — GLUCOSE, CAPILLARY
Glucose-Capillary: 247 mg/dL — ABNORMAL HIGH (ref 70–99)
Glucose-Capillary: 264 mg/dL — ABNORMAL HIGH (ref 70–99)
Glucose-Capillary: 253 mg/dL — ABNORMAL HIGH (ref 70–99)
Glucose-Capillary: 262 mg/dL — ABNORMAL HIGH (ref 70–99)
Glucose-Capillary: 234 mg/dL — ABNORMAL HIGH (ref 70–99)
Glucose-Capillary: 251 mg/dL — ABNORMAL HIGH (ref 70–99)

## 2022-05-05 LAB — SURGICAL PATHOLOGY

## 2022-05-05 SURGERY — IRRIGATION AND DEBRIDEMENT EXTREMITY
Anesthesia: General | Laterality: Left

## 2022-05-05 MED ORDER — CHLORHEXIDINE GLUCONATE 0.12 % MT SOLN: 15.0000 mL | Freq: Once | OROMUCOSAL | Status: AC

## 2022-05-05 MED ORDER — ORAL CARE MOUTH RINSE: 15.0000 mL | Freq: Once | OROMUCOSAL | Status: AC

## 2022-05-05 MED ORDER — 0.9 % SODIUM CHLORIDE (POUR BTL) OPTIME
TOPICAL | Status: DC | PRN
  Administered 2022-05-05: 1000 mL

## 2022-05-05 MED ORDER — LIDOCAINE 2% (20 MG/ML) 5 ML SYRINGE
INTRAMUSCULAR | Status: DC | PRN
  Administered 2022-05-05: 80 mg via INTRAVENOUS

## 2022-05-05 MED ORDER — INSULIN ASPART 100 UNIT/ML IJ SOLN
5.0000 [IU] | Freq: Once | INTRAMUSCULAR | Status: AC
  Administered 2022-05-05: 5 [IU] via SUBCUTANEOUS

## 2022-05-05 MED ORDER — PHENYLEPHRINE 80 MCG/ML (10ML) SYRINGE FOR IV PUSH (FOR BLOOD PRESSURE SUPPORT)
PREFILLED_SYRINGE | INTRAVENOUS | Status: DC | PRN
  Administered 2022-05-05: 160 ug via INTRAVENOUS

## 2022-05-05 MED ORDER — ETOMIDATE 2 MG/ML IV SOLN
INTRAVENOUS | Status: DC | PRN
  Administered 2022-05-05: 14 mg via INTRAVENOUS

## 2022-05-05 MED ORDER — SODIUM CHLORIDE 0.9 % IV SOLN: INTRAVENOUS | Status: DC

## 2022-05-05 MED ORDER — CHLORHEXIDINE GLUCONATE 0.12 % MT SOLN
OROMUCOSAL | Status: AC
  Administered 2022-05-05: 15 mL via OROMUCOSAL
  Filled 2022-05-05: qty 15

## 2022-05-05 MED ORDER — VASOPRESSIN 20 UNIT/ML IV SOLN
INTRAVENOUS | Status: AC
  Filled 2022-05-05: qty 1

## 2022-05-05 MED ORDER — PHENYLEPHRINE HCL-NACL 20-0.9 MG/250ML-% IV SOLN
INTRAVENOUS | Status: DC | PRN
  Administered 2022-05-05: 50 ug/min via INTRAVENOUS

## 2022-05-05 MED ORDER — ONDANSETRON HCL 4 MG/2ML IJ SOLN
INTRAMUSCULAR | Status: DC | PRN
  Administered 2022-05-05: 4 mg via INTRAVENOUS

## 2022-05-05 MED ORDER — ONDANSETRON HCL 4 MG/2ML IJ SOLN
INTRAMUSCULAR | Status: AC
  Filled 2022-05-05: qty 2

## 2022-05-05 MED ORDER — FENTANYL CITRATE (PF) 250 MCG/5ML IJ SOLN
INTRAMUSCULAR | Status: AC
  Filled 2022-05-05: qty 5

## 2022-05-05 MED ORDER — ACETAMINOPHEN 500 MG PO TABS
1000.0000 mg | ORAL_TABLET | Freq: Once | ORAL | Status: AC
  Administered 2022-05-05: 1000 mg via ORAL
  Filled 2022-05-05: qty 2

## 2022-05-05 MED ORDER — CHLORHEXIDINE GLUCONATE CLOTH 2 % EX PADS
6.0000 | MEDICATED_PAD | Freq: Every day | CUTANEOUS | Status: DC
  Administered 2022-05-06 – 2022-05-11 (×5): 6 via TOPICAL

## 2022-05-05 SURGICAL SUPPLY — 30 items
BAG COUNTER SPONGE SURGICOUNT (BAG) ×1 IMPLANT
BNDG ELASTIC 4X5.8 VLCR STR LF (GAUZE/BANDAGES/DRESSINGS) IMPLANT
BNDG GAUZE DERMACEA FLUFF 4 (GAUZE/BANDAGES/DRESSINGS) IMPLANT
CANISTER SUCT 3000ML PPV (MISCELLANEOUS) ×1 IMPLANT
COVER SURGICAL LIGHT HANDLE (MISCELLANEOUS) ×2 IMPLANT
DERMABOND ADVANCED .7 DNX12 (GAUZE/BANDAGES/DRESSINGS) ×1 IMPLANT
DRAIN JP 10F RND RADIO (DRAIN) IMPLANT
ELECT REM PT RETURN 9FT ADLT (ELECTROSURGICAL) ×1
ELECTRODE REM PT RTRN 9FT ADLT (ELECTROSURGICAL) ×1 IMPLANT
GAUZE SPONGE 4X4 12PLY STRL (GAUZE/BANDAGES/DRESSINGS) ×1 IMPLANT
GLOVE BIO SURGEON STRL SZ7.5 (GLOVE) ×1 IMPLANT
GLOVE BIOGEL PI IND STRL 8 (GLOVE) ×1 IMPLANT
GOWN STRL REUS W/ TWL LRG LVL3 (GOWN DISPOSABLE) ×2 IMPLANT
GOWN STRL REUS W/ TWL XL LVL3 (GOWN DISPOSABLE) ×2 IMPLANT
GOWN STRL REUS W/TWL LRG LVL3 (GOWN DISPOSABLE) ×2
GOWN STRL REUS W/TWL XL LVL3 (GOWN DISPOSABLE) ×2
KIT BASIN OR (CUSTOM PROCEDURE TRAY) ×1 IMPLANT
KIT TURNOVER KIT B (KITS) ×1 IMPLANT
NS IRRIG 1000ML POUR BTL (IV SOLUTION) ×1 IMPLANT
PACK CV ACCESS (CUSTOM PROCEDURE TRAY) ×1 IMPLANT
PAD ARMBOARD 7.5X6 YLW CONV (MISCELLANEOUS) ×2 IMPLANT
SUT ETHILON 3 0 FSL (SUTURE) IMPLANT
SUT ETHILON 3 0 PS 1 (SUTURE) IMPLANT
SUT MNCRL AB 4-0 PS2 18 (SUTURE) IMPLANT
SUT VIC AB 2-0 CT1 27 (SUTURE)
SUT VIC AB 2-0 CT1 TAPERPNT 27 (SUTURE) IMPLANT
SUT VIC AB 3-0 SH 27 (SUTURE) ×1
SUT VIC AB 3-0 SH 27X BRD (SUTURE) IMPLANT
TOWEL GREEN STERILE (TOWEL DISPOSABLE) ×1 IMPLANT
WATER STERILE IRR 1000ML POUR (IV SOLUTION) ×1 IMPLANT

## 2022-05-05 NOTE — Op Note (Signed)
Date: May 05, 2022  Preoperative diagnosis: Infected seroma left arm AV fistula  Postoperative diagnosis: Same  Procedure: Incision and drainage left arm AV fistula with drain placement  Surgeon: Dr. Marty Heck, MD  Assistant: Roxy Horseman, [place]  Indications: 71 year old female who recently underwent a left arm first stage brachiobasilic arteriovenous fistula on 04/02/22 by Dr. Trula Slade.  She was noted to have infection of her previous incision.  She presents today for I&D after risks benefits discussed.  Findings: Her previous left arm incision for fistula creation was reopened and there was a large purulent cavity that was drained and cultures were sent.  There appeared to be soft tissue coverage over the anastomosis of the fistula.  I placed a 10 French drain in the wound and then the skin was closed with nylon's.  Anesthesia: LMA  Details: Patient was taken to the operating room after informed consent was obtained.  Placed on the operative table supine position.  The left arm was then prepped and draped in standard sterile fashion.  Antibiotics were up-to-date.  Timeout performed.  I initially used an 11 blade scalpel and reopened her left arm incision over the area of fluctuance.  I then dissected down with manual dissection and encountered a large purulent cavity and cultures were sent.  I then copiously irrigated the cavity.  There did appear to be soft tissue coverage over the anastomosis.  I then placed a 10 French drain in the abscess cavity and this was tunneled out the arm through a counterincision in a subcutaneous tunnel.  We copiously irrigated the wound.  The subcutaneous tissue was then closed with 3-0 Vicryl's and the skin was closed with 3-0 nylons.  Dry sterile dressings were applied.  Taken to recovery in stable condition.  Complication: None  Condition: Stable  Marty Heck, MD [facility] Office:  [place]

## 2022-05-05 NOTE — Anesthesia Preprocedure Evaluation (Addendum)
Anesthesia Evaluation  Patient identified by MRN, date of birth, ID band Patient awake    Reviewed: Allergy & Precautions, NPO status , Patient's Chart, lab work & pertinent test results  History of Anesthesia Complications (+) PONV and history of anesthetic complications  Airway Mallampati: II  TM Distance: >3 FB Neck ROM: Full    Dental  (+) Dental Advisory Given, Loose   Pulmonary shortness of breath and Long-Term Oxygen Therapy, Patient abstained from smoking., former smoker   Pulmonary exam normal breath sounds clear to auscultation       Cardiovascular hypertension, Pt. on medications + angina  + CAD, + Past MI, + Peripheral Vascular Disease and +CHF  Normal cardiovascular exam+ dysrhythmias (RBBB)  Rhythm:Regular Rate:Normal     Neuro/Psych  Headaches PSYCHIATRIC DISORDERS Anxiety Depression    TIA Neuromuscular disease CVA    GI/Hepatic Neg liver ROS,GERD  ,,  Endo/Other  diabetes, Type 1, Insulin Dependent, Oral Hypoglycemic AgentsHypothyroidism    Renal/GU ESRF and DialysisRenal disease (TTHSAT)Infected left arm AVF     Musculoskeletal  (+) Arthritis ,    Abdominal   Peds  Hematology  (+) Blood dyscrasia (Eliquis), anemia   Anesthesia Other Findings Day of surgery medications reviewed with the patient.  Reproductive/Obstetrics                             Anesthesia Physical Anesthesia Plan  ASA: 4  Anesthesia Plan: General   Post-op Pain Management: Tylenol PO (pre-op)*   Induction: Intravenous  PONV Risk Score and Plan: 4 or greater and Dexamethasone and Ondansetron  Airway Management Planned: LMA  Additional Equipment:   Intra-op Plan:   Post-operative Plan: Extubation in OR  Informed Consent: I have reviewed the patients History and Physical, chart, labs and discussed the procedure including the risks, benefits and alternatives for the proposed anesthesia with  the patient or authorized representative who has indicated his/her understanding and acceptance.     Dental advisory given  Plan Discussed with: CRNA  Anesthesia Plan Comments:         Anesthesia Quick Evaluation

## 2022-05-05 NOTE — Progress Notes (Signed)
Pt receives out-pt HD at [facility]) on TTS 10:30 chair time. Plans for snf noted. Will assist as needed.   Melven Sartorius Renal Navigator [phone]

## 2022-05-05 NOTE — Progress Notes (Signed)
Patient ID: Susan [name], female   DOB: [birth year], 71 y.o.   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  There is no drainage in the wound VAC canister there is a good suction fit.  Plan for discharge to skilled nursing.  Plan for irrigation and debridement left forearm with vascular surgery today.

## 2022-05-05 NOTE — Plan of Care (Signed)
  Problem: Metabolic: Goal: Ability to maintain appropriate glucose levels will improve Outcome: Progressing   Problem: Education: Goal: Knowledge of the prescribed therapeutic regimen will improve Outcome: Progressing   Problem: Clinical Measurements: Goal: Postoperative complications will be avoided or minimized Outcome: Progressing   Problem: Self-Care: Goal: Ability to meet self-care needs will improve Outcome: Progressing   Problem: Pain Management: Goal: Pain level will decrease with appropriate interventions Outcome: Progressing

## 2022-05-05 NOTE — Progress Notes (Signed)
[facility] Progress Note   Subjective:   Nervous about debridement planned for this AM, feels a little light headed and SOB but she thinks it is due to anxiety. No CP, palpitations or nausea.   Objective Vitals:   05/04/22 2024 05/04/22 2335 05/05/22 0408 05/05/22 0801  BP: (!) 112/45 (!) 143/51 (!) 134/50 (!) 136/55  Pulse: 79 92 89 88  Resp: '17  19 18  '$ Temp: 98.5 F (36.9 C)  98.5 F (36.9 C) 98.1 F (36.7 C)  TempSrc: Oral   Oral  SpO2: 95%  100% 99%  Weight:      Height:       Physical Exam General: Alert female in NAD Heart: RRR, no murmurs, rubs or gallops Lungs: CTA bilaterally, no wheezing, rhonchi or rales Abdomen: Soft, non-distended +BS Extremities: No edema b/l lower extremities, LUE AVF + bruit, warm and edematous Dialysis Access: [facility]  Additional Objective Labs: Basic Metabolic Panel: Recent Labs  Lab 05/02/22 0849 05/03/22 0308 05/04/22 0322  NA 134* 136 135  K 2.9* 3.4* 4.0  CL 93* 94* 93*  CO2 '30 28 28  '$ GLUCOSE 282* 168* 99  BUN 28* 41* 33*  CREATININE 2.93* 3.62* 2.84*  CALCIUM 8.4* 8.5* 8.5*  PHOS  --  3.6  --    Liver Function Tests: Recent Labs  Lab 05/02/22 0849  AST 12*  ALT 10  ALKPHOS 162*  BILITOT 0.4  PROT 5.9*  ALBUMIN 2.0*   No results for input(s): "LIPASE", "AMYLASE" in the last 168 hours. CBC: Recent Labs  Lab 05/02/22 0849 05/03/22 0308 05/04/22 0322  WBC 11.9* 15.3* 13.6*  NEUTROABS 10.0*  --   --   HGB 8.7* 9.4* 9.6*  HCT 30.1* 31.5* 32.1*  MCV 81.4 82.5 82.9  PLT 344 310 257   Blood Culture    Component Value Date/Time   SDES BLOOD RIGHT FOREARM 02/03/2022 0945   SPECREQUEST  02/03/2022 0945    BOTTLES DRAWN AEROBIC AND ANAEROBIC Blood Culture adequate volume   CULT  02/03/2022 0945    NO GROWTH 5 DAYS Performed at [facility], [place] [address] [place], [place] [address]    REPTSTATUS 02/08/2022 FINAL 02/03/2022 0945    Cardiac Enzymes: No results for input(s):  "CKTOTAL", "CKMB", "CKMBINDEX", "TROPONINI" in the last 168 hours. CBG: Recent Labs  Lab 05/04/22 0805 05/04/22 1152 05/04/22 1731 05/04/22 2007 05/05/22 0758  GLUCAP 96 115* 114* 160* 247*   Iron Studies: No results for input(s): "IRON", "TIBC", "TRANSFERRIN", "FERRITIN" in the last 72 hours. '@lablastinr3'$ @ Studies/Results: No results found. Medications:  cefTAZidime (FORTAZ)  IV Stopped (05/04/22 1458)   [START ON 05/06/2022] vancomycin      (feeding supplement) PROSource Plus  30 mL Oral BID BM   amiodarone  200 mg Oral Daily   vitamin C  1,000 mg Oral Daily   aspirin EC  81 mg Oral Q breakfast   bacitracin   Topical Daily   Chlorhexidine Gluconate Cloth  6 each Topical Daily   Chlorhexidine Gluconate Cloth  6 each Topical Q0600   docusate sodium  100 mg Oral Daily   Gerhardt's butt cream   Topical TID   insulin aspart  0-5 Units Subcutaneous QHS   insulin aspart  0-9 Units Subcutaneous TID WC   insulin aspart  3 Units Subcutaneous TID WC   insulin glargine-yfgn  28 Units Subcutaneous Daily   levothyroxine  50 mcg Oral Q0600   linagliptin  5 mg Oral Daily  nutrition supplement (JUVEN)  1 packet Oral BID BM   pantoprazole  40 mg Oral Daily   pregabalin  75 mg Oral Daily   sevelamer carbonate  1,600 mg Oral TID WC   vortioxetine HBr  20 mg Oral Daily   zinc sulfate  220 mg Oral Daily    Dialysis Orders: TTS at [facility] - fairly new, just started HD 03/2022 4hr, 400/600, EDW 113.5kg, 2K/2Ca, TDC, heparin 2000 unit bolus - Mircera 178mg IV q 2 weeks (last given 11/30) - Venofer '100mg'$  x 10 ordered - Calcitriol 0.527m PO q HD    Assessment/Plan:  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 DuSharol Given.  LUE AVF wound infection: Erythema/warmth and serous drainage. VVS following - plan for OR today for drainage.  On vanc and ceftaz per pharm dosing.  3.  ESRD:  Continue HD on usual TTS schedule - next Tues 4.  Hypertension/volume: BP stable, chronically overloaded, 3.3L UF  12/9 -> dry weight has been challenged down as outpatient, will continue this. 5.  Anemia: Hgb 9.6 - not due for ESA yet 6.  Metabolic bone disease: Ca/Phos ok - no home binders, continue VDRA. 7.  Nutrition:  Alb low, added supplements. 8.  T2DM 9.  A-fib - eliquis on hold for surgery 12/11  SaAnice PaganiniPA-C 05/05/2022, 9:25 AM  [facility] Pager: [phone]

## 2022-05-05 NOTE — Progress Notes (Signed)
[facility]  Subjective  - no complaints   Objective (!) 136/55 88 98.1 F (36.7 C) (Oral) 18 99%  Intake/Output Summary (Last 24 hours) at 05/05/2022 1156 Last data filed at 05/05/2022 3833 Gross per 24 hour  Intake 100.06 ml  Output 0 ml  Net 100.06 ml    Ongoing erythema with draining seroma from left arm fistula incision  Laboratory Lab Results: Recent Labs    05/03/22 0308 05/04/22 0322 05/05/22 1145  WBC 15.3* 13.6*  --   HGB 9.4* 9.6* 9.2*  HCT 31.5* 32.1* 27.0*  PLT 310 257  --    BMET Recent Labs    05/03/22 0308 05/04/22 0322 05/05/22 1145  NA 136 135 126*  K 3.4* 4.0 4.1  CL 94* 93* 98  CO2 28 28  --   GLUCOSE 168* 99 261*  BUN 41* 33* 57*  CREATININE 3.62* 2.84* 4.10*  CALCIUM 8.5* 8.5*  --     COAG Lab Results  Component Value Date   INR 1.5 (H) 02/01/2022   INR 1.3 (H) 01/14/2022   INR 0.9 03/05/2021   No results found for: "PTT"  Assessment/Planning:  Discussed plan for I&D of left arm at site of infected seroma after recent first stage basilic vein fistula.  Marty Heck 05/05/2022 11:56 AM --

## 2022-05-05 NOTE — Progress Notes (Signed)
Inpatient Rehab Admissions Coordinator:    Note PT/OT recommendations for SNF and SW working on placement. CIR will not pursue.   Clemens Catholic, [place], [place] Admissions Coordinator  [phone] ([place]) [phone] (office)

## 2022-05-05 NOTE — Anesthesia Procedure Notes (Signed)
Procedure Name: LMA Insertion Date/Time: 05/05/2022 12:51 PM  Performed by: Inda Coke, CRNAPre-anesthesia Checklist: Patient identified, Emergency Drugs available, Suction available, Timeout performed and Patient being monitored Patient Re-evaluated:Patient Re-evaluated prior to induction Oxygen Delivery Method: Circle system utilized Preoxygenation: Pre-oxygenation with 100% oxygen Induction Type: IV induction Ventilation: Mask ventilation without difficulty LMA: LMA inserted LMA Size: 4.0 Tube type: Oral Placement Confirmation: positive ETCO2, CO2 detector and breath sounds checked- equal and bilateral Tube secured with: Tape Dental Injury: Teeth and Oropharynx as per pre-operative assessment

## 2022-05-05 NOTE — Transfer of Care (Signed)
Immediate Anesthesia Transfer of Care Note  Patient: Susan [name]  Procedure(s) Performed: IRRIGATION AND DEBRIDEMENT LEFT ARM AV FISTULA (Left)  Patient Location: PACU  Anesthesia Type:General  Level of Consciousness: awake, alert , and oriented  Airway & Oxygen Therapy: Patient Spontanous Breathing and Patient connected to nasal cannula oxygen  Post-op Assessment: Report given to RN and Post -op Vital signs reviewed and stable  Post vital signs: Reviewed and stable  Last Vitals:  Vitals Value Taken Time  BP 124/53 05/05/22 1351  Temp    Pulse 90 05/05/22 1356  Resp 11 05/05/22 1356  SpO2 99 % 05/05/22 1356  Vitals shown include unvalidated device data.  Last Pain:  Vitals:   05/05/22 1136  TempSrc: Oral  PainSc:       Patients Stated Pain Goal: 4 (94/50/38 8828)  Complications: No notable events documented.

## 2022-05-05 NOTE — Anesthesia Postprocedure Evaluation (Signed)
Anesthesia Post Note  Patient: Susan [name]  Procedure(s) Performed: IRRIGATION AND DEBRIDEMENT LEFT ARM AV FISTULA (Left)     Patient location during evaluation: PACU Anesthesia Type: General Level of consciousness: awake and alert Pain management: pain level controlled Vital Signs Assessment: post-procedure vital signs reviewed and stable Respiratory status: spontaneous breathing, nonlabored ventilation, respiratory function stable and patient connected to nasal cannula oxygen Cardiovascular status: blood pressure returned to baseline and stable Postop Assessment: no apparent nausea or vomiting Anesthetic complications: no   No notable events documented.  Last Vitals:  Vitals:   05/05/22 1515 05/05/22 2037  BP: (!) 100/42 (!) 125/51  Pulse: 85 81  Resp: 17 17  Temp: 36.7 C 36.4 C  SpO2: 100% 100%    Last Pain:  Vitals:   05/05/22 1515  TempSrc: Oral  PainSc:                  Santa Lighter

## 2022-05-06 ENCOUNTER — Encounter (HOSPITAL_COMMUNITY): Payer: Self-pay | Admitting: Vascular Surgery

## 2022-05-06 LAB — CBC
HCT: 25.8 % — ABNORMAL LOW (ref 36.0–46.0)
Hemoglobin: 7.9 g/dL — ABNORMAL LOW (ref 12.0–15.0)
MCH: 25 pg — ABNORMAL LOW (ref 26.0–34.0)
MCHC: 30.6 g/dL (ref 30.0–36.0)
MCV: 81.6 fL (ref 80.0–100.0)
Platelets: 315 K/uL (ref 150–400)
RBC: 3.16 MIL/uL — ABNORMAL LOW (ref 3.87–5.11)
RDW: 19.2 % — ABNORMAL HIGH (ref 11.5–15.5)
WBC: 10.3 K/uL (ref 4.0–10.5)
nRBC: 0 % (ref 0.0–0.2)

## 2022-05-06 LAB — GLUCOSE, CAPILLARY
Glucose-Capillary: 276 mg/dL — ABNORMAL HIGH (ref 70–99)
Glucose-Capillary: 159 mg/dL — ABNORMAL HIGH (ref 70–99)
Glucose-Capillary: 161 mg/dL — ABNORMAL HIGH (ref 70–99)
Glucose-Capillary: 150 mg/dL — ABNORMAL HIGH (ref 70–99)
Glucose-Capillary: 146 mg/dL — ABNORMAL HIGH (ref 70–99)

## 2022-05-06 LAB — RENAL FUNCTION PANEL
Albumin: 1.7 g/dL — ABNORMAL LOW (ref 3.5–5.0)
Anion gap: 13 (ref 5–15)
BUN: 77 mg/dL — ABNORMAL HIGH (ref 8–23)
CO2: 25 mmol/L (ref 22–32)
Calcium: 8.7 mg/dL — ABNORMAL LOW (ref 8.9–10.3)
Chloride: 95 mmol/L — ABNORMAL LOW (ref 98–111)
Creatinine, Ser: 4.65 mg/dL — ABNORMAL HIGH (ref 0.44–1.00)
GFR, Estimated: 9 mL/min — ABNORMAL LOW
Glucose, Bld: 302 mg/dL — ABNORMAL HIGH (ref 70–99)
Phosphorus: 5 mg/dL — ABNORMAL HIGH (ref 2.5–4.6)
Potassium: 4.3 mmol/L (ref 3.5–5.1)
Sodium: 133 mmol/L — ABNORMAL LOW (ref 135–145)

## 2022-05-06 LAB — HEPATITIS B SURFACE ANTIBODY, QUANTITATIVE: Hep B S AB Quant (Post): 5.7 m[IU]/mL — ABNORMAL LOW (ref >9.9)

## 2022-05-06 MED ORDER — PENTAFLUOROPROP-TETRAFLUOROETH EX AERO: 1.0000 | INHALATION_SPRAY | CUTANEOUS | Status: DC | PRN

## 2022-05-06 MED ORDER — ALTEPLASE 2 MG IJ SOLR: 2.0000 mg | Freq: Once | INTRAMUSCULAR | Status: DC | PRN

## 2022-05-06 MED ORDER — HEPARIN SODIUM (PORCINE) 1000 UNIT/ML DIALYSIS
1000.0000 [IU] | INTRAMUSCULAR | Status: DC | PRN
  Administered 2022-05-06: 3800 [IU]
  Filled 2022-05-06: qty 1

## 2022-05-06 MED ORDER — ANTICOAGULANT SODIUM CITRATE 4% (200MG/5ML) IV SOLN: 5.0000 mL | Status: DC | PRN

## 2022-05-06 MED ORDER — LIDOCAINE-PRILOCAINE 2.5-2.5 % EX CREA: 1.0000 | TOPICAL_CREAM | CUTANEOUS | Status: DC | PRN

## 2022-05-06 MED ORDER — LIDOCAINE HCL (PF) 1 % IJ SOLN: 5.0000 mL | INTRAMUSCULAR | Status: DC | PRN

## 2022-05-06 NOTE — Progress Notes (Signed)
Physical Therapy Treatment Patient Details Name: Jeannetta Cerutti MRN: [id number] DOB: [birth year] Today's Date: 05/06/2022   History of Present Illness Pt is a 71 y.o. female admitted 05/02/22 with R heel gangrene. 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 PMH includes recent admission 03/2022 with R heel drainage AKI; other PMH includes DM2, CHF, HTN, CAD, CKD, MI, BLE venous insufficiency, anxiety.    PT Comments    Pt returned from HD, BP low so cannot receive pain meds and pt c/o pain and hunger. Encouraged pt in getting to chair with maximove but pt not up for this. Also tried sitting up EOB but pt also not willing to try this at this time. Education given with use of pt's husband as example for STS transfer with stedy as well as lateral scoot transfer with use of sliding board. Since pt has been anxious and hesitant hoping that this education will help her feel better about trying these things. Pt performed bed level amputee exercises. Education given on managing phantom limb pain as well as positioning of knee and hip. PT will continue to follow.   Recommendations for follow up therapy are one component of a multi-disciplinary discharge planning process, led by the attending physician.  Recommendations may be updated based on patient status, additional functional criteria and insurance authorization.  Follow Up Recommendations  Skilled nursing-short term rehab (<3 hours/day) Can patient physically be transported by private vehicle: No   Assistance Recommended at Discharge Intermittent Supervision/Assistance  Patient can return home with the following Two people to help with walking and/or transfers;Two people to help with bathing/dressing/bathroom;Assistance with cooking/housework;Assist for transportation;Help with stairs or ramp for entrance   Equipment Recommendations  Other (comment) (bariatric wheelchair, hoyer lift, hospital bed)    Recommendations for Other Services       Precautions / Restrictions  Precautions Precautions: Fall;Other (comment) Precaution Comments: R BKA wound vac; urine incontinence; decreased skin integrity; wears O2 at night Required Braces or Orthoses: Other Brace Other Brace: R BKA limb guard Restrictions Weight Bearing Restrictions: Yes RLE Weight Bearing: Non weight bearing     Mobility  Bed Mobility               General bed mobility comments: refused mobility due to pain so focused session on ther ex and education    Transfers                   General transfer comment: pt declined even with maximove but discussed transferring tomorrow and demo'ed use of stedy with patients husband as example as well as use of sliding board so that pt would be familiar and hopefully to help decrease her anxiety about these things    Ambulation/Gait               General Gait Details: unable   Stairs             Wheelchair Mobility    Modified Rankin (Stroke Patients Only)       Balance                                            Cognition Arousal/Alertness: Awake/alert Behavior During Therapy: Anxious Overall Cognitive Status: Impaired/Different from baseline Area of Impairment: Attention, Memory, Following commands, Problem solving, Safety/judgement, Awareness                   Current Attention  Level: Sustained Memory: Decreased short-term memory Following Commands: Follows one step commands with increased time Safety/Judgement: Decreased awareness of deficits, Decreased awareness of safety Awareness: Emergent Problem Solving: Slow processing, Decreased initiation, Difficulty sequencing, Requires verbal cues, Requires tactile cues General Comments: anxious and focused on eating        Exercises Amputee Exercises Quad Sets: AROM, Both, 10 reps, Supine Hip ABduction/ADduction: AAROM, Right, 10 reps, Supine Knee Flexion: AROM, Right, 10 reps, Supine Straight Leg Raises: AROM, Both, 10 reps,  Supine    General Comments General comments (skin integrity, edema, etc.): discussed phantom limb pain which she is starting to have and strategies to cope. Pt verbalized understanding      Pertinent Vitals/Pain Pain Assessment Pain Assessment: Faces Faces Pain Scale: Hurts even more Pain Location: R residual limb Pain Descriptors / Indicators: Cramping, Grimacing, Guarding Pain Intervention(s): Limited activity within patient's tolerance, Monitored during session    Home Living                          Prior Function            PT Goals (current goals can now be found in the care plan section) Acute Rehab PT Goals Patient Stated Goal: decreased pain PT Goal Formulation: With patient Time For Goal Achievement: 05/18/22 Potential to Achieve Goals: Fair Progress towards PT goals: Not progressing toward goals - comment (HD, pain, fatigue)    Frequency    Min 2X/week      PT Plan Current plan remains appropriate    Co-evaluation              AM-PAC PT "6 Clicks" Mobility   Outcome Measure  Help needed turning from your back to your side while in a flat bed without using bedrails?: Total Help needed moving from lying on your back to sitting on the side of a flat bed without using bedrails?: Total Help needed moving to and from a bed to a chair (including a wheelchair)?: Total Help needed standing up from a chair using your arms (e.g., wheelchair or bedside chair)?: Total Help needed to walk in hospital room?: Total Help needed climbing 3-5 steps with a railing? : Total 6 Click Score: 6    End of Session   Activity Tolerance: Patient limited by pain;Patient limited by fatigue (patient limited by anxiety) Patient left: in bed;with call bell/phone within reach Nurse Communication: Mobility status PT Visit Diagnosis: Other abnormalities of gait and mobility (R26.89);Muscle weakness (generalized) (M62.81);Pain Pain - Right/Left: Right Pain - part of  body: Leg     Time: 0350-0938 PT Time Calculation (min) (ACUTE ONLY): 22 min  Charges:  $Therapeutic Exercise: 8-22 mins                     Leighton Roach, PT  Acute Rehab Services Secure chat preferred Office [place] 05/06/2022, 3:16 PM

## 2022-05-06 NOTE — Progress Notes (Signed)
[facility] [place]  Subjective  -no complaints   Objective (!) 117/42 89 98.6 F (37 C) 17 100%  Intake/Output Summary (Last 24 hours) at 05/06/2022 0636 Last data filed at 05/05/2022 1700 Gross per 24 hour  Intake 540 ml  Output 20 ml  Net 520 ml    Left arm incision closed with nylon's Left arm drain with serosanguineous output Left basilic vein fistula with good thrill  Laboratory Lab Results: Recent Labs    05/04/22 0322 05/05/22 1145  WBC 13.6*  --   HGB 9.6* 9.2*  HCT 32.1* 27.0*  PLT 257  --    BMET Recent Labs    05/04/22 0322 05/05/22 1145  NA 135 126*  K 4.0 4.1  CL 93* 98  CO2 28  --   GLUCOSE 99 261*  BUN 33* 57*  CREATININE 2.84* 4.10*  CALCIUM 8.5*  --     COAG Lab Results  Component Value Date   INR 1.5 (H) 02/01/2022   INR 1.3 (H) 01/14/2022   INR 0.9 03/05/2021   No results found for: "PTT"  Assessment/Planning:  Postop day 1 status post I&D of infected left arm fistula.  Cultures are pending.  Will leave drain today given ongoing output.  Currently on vanc and ceftazidime.  Dressing changed at bedside.  Marty Heck 05/06/2022 6:36 AM --

## 2022-05-06 NOTE — Progress Notes (Signed)
RE:  Susan [name]       Date of Birth:  [birth year]     Date:  05/06/22   To Whom It May Concern:  Please be advised that the above-named patient will require a short-term nursing home stay - anticipated 30 days or less for rehabilitation and strengthening.  The plan is for return home.                 MD signature                Date

## 2022-05-06 NOTE — Progress Notes (Signed)
[facility] Progress Note   Subjective:   Reports surgery went well yesterday, feeling fine today. Denies SOB, CP, palpitations, dizziness, abdominal pain and nausea.   Objective Vitals:   05/05/22 2037 05/05/22 2355 05/06/22 0508 05/06/22 0809  BP: (!) 125/51 137/60 (!) 117/42 (!) 128/49  Pulse: 81 84 89 88  Resp: '17 17 17 17  '$ Temp: 97.6 F (36.4 C) 97.6 F (36.4 C) 98.6 F (37 C) 98.4 F (36.9 C)  TempSrc:    Oral  SpO2: 100% 100% 100% 100%  Weight:      Height:       Physical Exam General: Alert female in NAD Heart: RRR, no murmurs, rubs or gallops Lungs: CTA bilaterally, no wheezing, rhonchi or rales Abdomen: Soft, non-distended +BS Extremities: No edema b/l lower extremities, LUE AVF wrapped Dialysis Access: R[facility]  Additional Objective Labs: Basic Metabolic Panel: Recent Labs  Lab 05/02/22 0849 05/03/22 0308 05/04/22 0322 05/05/22 1145  NA 134* 136 135 126*  K 2.9* 3.4* 4.0 4.1  CL 93* 94* 93* 98  CO2 '30 28 28  '$ --   GLUCOSE 282* 168* 99 261*  BUN 28* 41* 33* 57*  CREATININE 2.93* 3.62* 2.84* 4.10*  CALCIUM 8.4* 8.5* 8.5*  --   PHOS  --  3.6  --   --    Liver Function Tests: Recent Labs  Lab 05/02/22 0849  AST 12*  ALT 10  ALKPHOS 162*  BILITOT 0.4  PROT 5.9*  ALBUMIN 2.0*   No results for input(s): "LIPASE", "AMYLASE" in the last 168 hours. CBC: Recent Labs  Lab 05/02/22 0849 05/03/22 0308 05/04/22 0322 05/05/22 1145  WBC 11.9* 15.3* 13.6*  --   NEUTROABS 10.0*  --   --   --   HGB 8.7* 9.4* 9.6* 9.2*  HCT 30.1* 31.5* 32.1* 27.0*  MCV 81.4 82.5 82.9  --   PLT 344 310 257  --    Blood Culture    Component Value Date/Time   SDES ABSCESS LEFT ARM 05/05/2022 1313   SPECREQUEST NONE 05/05/2022 1313   CULT  05/05/2022 1313    NO GROWTH < 24 HOURS Performed at [facility] [address], [place], [place] [address]    REPTSTATUS PENDING 05/05/2022 1313    Cardiac Enzymes: No results for input(s): "CKTOTAL",  "CKMB", "CKMBINDEX", "TROPONINI" in the last 168 hours. CBG: Recent Labs  Lab 05/05/22 1137 05/05/22 1349 05/05/22 1636 05/05/22 2037 05/06/22 0805  GLUCAP 253* 262* 234* 251* 276*   Iron Studies: No results for input(s): "IRON", "TIBC", "TRANSFERRIN", "FERRITIN" in the last 72 hours. '@lablastinr3'$ @ Studies/Results: No results found. Medications:  cefTAZidime (FORTAZ)  IV 1 g (05/05/22 1832)   vancomycin      (feeding supplement) PROSource Plus  30 mL Oral BID BM   amiodarone  200 mg Oral Daily   vitamin C  1,000 mg Oral Daily   aspirin EC  81 mg Oral Q breakfast   bacitracin   Topical Daily   Chlorhexidine Gluconate Cloth  6 each Topical Daily   Chlorhexidine Gluconate Cloth  6 each Topical Q0600   Chlorhexidine Gluconate Cloth  6 each Topical Q0600   docusate sodium  100 mg Oral Daily   Gerhardt's butt cream   Topical TID   insulin aspart  0-5 Units Subcutaneous QHS   insulin aspart  0-9 Units Subcutaneous TID WC   insulin aspart  3 Units Subcutaneous TID WC   insulin glargine-yfgn  28 Units Subcutaneous Daily  levothyroxine  50 mcg Oral Q0600   linagliptin  5 mg Oral Daily   nutrition supplement (JUVEN)  1 packet Oral BID BM   pantoprazole  40 mg Oral Daily   pregabalin  75 mg Oral Daily   sevelamer carbonate  1,600 mg Oral TID WC   vortioxetine HBr  20 mg Oral Daily   zinc sulfate  220 mg Oral Daily    Outpatient Dialysis Orders: TTS at [facility] - fairly new, just started HD 03/2022 4hr, 400/600, EDW 113.5kg, 2K/2Ca, TDC, heparin 2000 unit bolus - Mircera 16mg IV q 2 weeks (last given 11/30) - Venofer '100mg'$  x 10 ordered - Calcitriol 0.569m PO q HD  Assessment/Plan:  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 DuSharol Given.  LUE AVF wound infection: Erythema/warmth and serous drainage. VVS following - s/p I&D.  On vanc and ceftaz per pharm dosing.  3.  ESRD:  Continue HD on usual TTS schedule - next Today 4.  Hypertension/volume: BP stable, chronically overloaded,  3.3L UF 12/9 -> dry weight has been challenged down as outpatient, will continue this. 5.  Anemia: Hgb 9.2 - not due for ESA yet 6.  Metabolic bone disease: Ca/Phos ok - no home binders, continue VDRA. 7.  Nutrition:  Alb low, added supplements. 8.  T2DM 9.  A-fib - eliquis on hold for surgery 12/11  SaAnice PaganiniPA-C 05/06/2022, 9:03 AM  [facility] Pager: [phone]

## 2022-05-06 NOTE — TOC Progression Note (Signed)
Transition of Care [facility]) - Progression Note    Patient Details  Name: Remi Rester MRN: [id number] Date of Birth: 06-09-1950  [facility]) CM/SW Contact  Joanne Chars, LCSW Phone Number: 05/06/2022, 3:56 PM  Clinical Narrative:   Bed offers presented to pt, husband and daughter.  They are asking for responses from [facility], [facility], and [facility].  CSW reached out to those facilities.    Expected Discharge Plan: Skilled Nursing Facility Barriers to Discharge: Continued Medical Work up, SNF Pending bed offer  Expected Discharge Plan and Services Expected Discharge Plan: [place] Choice: [place] Living arrangements for the past 2 months: Single Family Home                                       Social Determinants of Health (SDOH) Interventions Housing Interventions: Intervention Not Indicated Transportation Interventions: Intervention Not Indicated Utilities Interventions: Intervention Not Indicated  Readmission Risk Interventions    02/07/2022    2:57 PM 01/23/2022   12:38 PM 05/28/2020   12:50 PM  Readmission Risk Prevention Plan  Transportation Screening Complete Complete Complete  PCP or Specialist Appt within 3-5 Days  Complete   HRI or [place]  Complete   Social Work Consult for [place] Planning/Counseling  Complete   Palliative Care Screening  Not Applicable   Medication Review Press photographer) Complete Complete Complete  PCP or Specialist appointment within 3-5 days of discharge Complete  Complete  HRI or [place] Complete  Complete  SW Recovery Care/Counseling Consult Complete  Complete  Palliative Care Screening Not Applicable  Not [place] Not Applicable  Not Applicable

## 2022-05-06 NOTE — Progress Notes (Signed)
PT Cancellation Note  Patient Details Name: Susan [name] MRN: [id number] DOB: 03/06/1951   Cancelled Treatment:    Reason Eval/Treat Not Completed: Patient at procedure or test/unavailable (HD). Will check back as time allows.   Leighton Roach, PT  Acute Rehab Services Secure chat preferred Office [place] 05/06/2022, 1:41 PM

## 2022-05-06 NOTE — Progress Notes (Signed)
Pt tolerated tx well, pt returned to the room      05/06/22 1323  Vitals  Temp 98.1 F (36.7 C)  Temp Source Oral  BP (!) 98/50  MAP (mmHg) (!) 62  BP Location Right Arm  BP Method Automatic  Pulse Rate 90  Pulse Rate Source Monitor  Resp 13  Oxygen Therapy  SpO2 91 %  O2 Device Room Air  O2 Flow Rate (L/min) 3 L/min  During Treatment Monitoring  Intra-Hemodialysis Comments Tx completed  Post Treatment  Dialyzer Clearance Lightly streaked  Duration of HD Treatment -hour(s) 3 hour(s)  Liters Processed 72  Fluid Removed (mL) 3000 mL  Tolerated HD Treatment Yes

## 2022-05-06 NOTE — Plan of Care (Signed)
  Problem: Education: Goal: Ability to describe self-care measures that may prevent or decrease complications (Diabetes Survival Skills Education) will improve Outcome: Progressing   Problem: Skin Integrity: Goal: Risk for impaired skin integrity will decrease Outcome: Progressing   Problem: Education: Goal: Knowledge of the prescribed therapeutic regimen will improve Outcome: Progressing   Problem: Activity: Goal: Ability to perform//tolerate increased activity and mobilize with assistive devices will improve Outcome: Progressing   Problem: Pain Management: Goal: Pain level will decrease with appropriate interventions Outcome: Progressing   Problem: Skin Integrity: Goal: Demonstration of wound healing without infection will improve Outcome: Progressing

## 2022-05-06 NOTE — TOC Progression Note (Signed)
Transition of Care [facility]) - Progression Note    Patient Details  Name: Susan [name] MRN: [id number] Date of Birth: Aug 29, 1950  Transition of Care [facility]) CM/SW Contact  Joanne Chars, LCSW Phone Number: 05/06/2022, 1:22 PM  Clinical Narrative:   PASSR received: [phone] [id number]    Expected Discharge Plan: [place] Barriers to Discharge: Continued Medical Work up, SNF Pending bed offer  Expected Discharge Plan and Services Expected Discharge Plan: [place] Choice: [place] Living arrangements for the past 2 months: Single Family Home                                       Social Determinants of Health (SDOH) Interventions Housing Interventions: Intervention Not Indicated Transportation Interventions: Intervention Not Indicated Utilities Interventions: Intervention Not Indicated  Readmission Risk Interventions    02/07/2022    2:57 PM 01/23/2022   12:38 PM 05/28/2020   12:50 PM  Readmission Risk Prevention Plan  Transportation Screening Complete Complete Complete  PCP or Specialist Appt within 3-5 Days  Complete   HRI or Home Care Consult  Complete   Social Work Consult for [place] Planning/Counseling  Complete   Palliative Care Screening  Not Applicable   Medication Review Press photographer) Complete Complete Complete  PCP or Specialist appointment within 3-5 days of discharge Complete  Complete  HRI or [place] Complete  Complete  SW Recovery Care/Counseling Consult Complete  Complete  Palliative Care Screening Not Applicable  Not [place] Not Applicable  Not Applicable

## 2022-05-06 NOTE — Inpatient Diabetes Management (Signed)
Inpatient Diabetes Program Recommendations  AACE/ADA: New Consensus Statement on Inpatient Glycemic Control   Target Ranges:  Prepandial:   less than 140 mg/dL      Peak postprandial:   less than 180 mg/dL (1-2 hours)      Critically ill patients:  140 - 180 mg/dL    Latest Reference Range & Units 05/05/22 07:58 05/05/22 11:09 05/05/22 11:37 05/05/22 13:49 05/05/22 16:36 05/05/22 20:37  Glucose-Capillary 70 - 99 mg/dL 247 (H) 264 (H) 253 (H) 262 (H) 234 (H) 251 (H)   Review of Glycemic Control  Diabetes history: DM1 Outpatient Diabetes medications: OmniPod with U100 Regular insulin (total basal 25.9 units/day, Carb coverage 1:12 grams, Sensitivity factor 1:40 mg/dl  Current orders for Inpatient glycemic control: Semglee 28 units daily, Novolog 3 units TID with meals, Novolog 0-9 units TID with meals, Novolog 0-5 units QHS, Tradjenta 5 mg daily  Inpatient Diabetes Program Recommendations:    Insulin: Patient did NOT receive any basal insulin on 05/05/22. NURSING: Please be sure to administer basal insulin as ordered. Patient has Type 1 DM and requires basal insulin.  NOTE: Patient has Type 1 DM and had I&D of left arm AV fistula with drain placement on 05/05/22. Patient is ordered Semglee 28 units daily. However, she did not receive Semglee on 05/05/22 (per [facility] charted as NOT GIVEN because "patient in surgery".  Anticipate glucose will be elevated today. Sent chat message to Ander Purpura, RN regarding concern about patient with DM1 not receiving basal insulin yesterday. RN reports that glucose is 276 mg/dl this morning and patient ate breakfast prior to CBG check this morning.   Thanks, Barnie Alderman, RN, MSN, [place] Diabetes Coordinator Inpatient Diabetes Program [phone] (Team Pager from 8am to [place])

## 2022-05-07 ENCOUNTER — Ambulatory Visit: Payer: Self-pay | Admitting: Infectious Diseases

## 2022-05-07 ENCOUNTER — Telehealth: Payer: Self-pay | Admitting: Orthopedic Surgery

## 2022-05-07 LAB — GLUCOSE, CAPILLARY
Glucose-Capillary: 146 mg/dL — ABNORMAL HIGH (ref 70–99)
Glucose-Capillary: 266 mg/dL — ABNORMAL HIGH (ref 70–99)
Glucose-Capillary: 274 mg/dL — ABNORMAL HIGH (ref 70–99)
Glucose-Capillary: 173 mg/dL — ABNORMAL HIGH (ref 70–99)
Glucose-Capillary: 181 mg/dL — ABNORMAL HIGH (ref 70–99)

## 2022-05-07 MED ORDER — CHLORHEXIDINE GLUCONATE CLOTH 2 % EX PADS: 6.0000 | MEDICATED_PAD | Freq: Every day | CUTANEOUS | Status: DC

## 2022-05-07 MED ORDER — DARBEPOETIN ALFA 100 MCG/0.5ML IJ SOSY
100.0000 ug | PREFILLED_SYRINGE | INTRAMUSCULAR | Status: DC
  Administered 2022-05-08: 100 ug via SUBCUTANEOUS
  Filled 2022-05-07: qty 0.5

## 2022-05-07 NOTE — Progress Notes (Signed)
Contacted  by CSW with request for pt to be changed to a [facility] HD clinic on MWF in order to be placed at [facility]. Referral submitted to [facility] admissions this afternoon with request for a MWF schedule at a [place] clinic. Will await determination.    Melven Sartorius Renal Navigator [phone]

## 2022-05-07 NOTE — Progress Notes (Addendum)
@  1100- Rapid RN rounded on patient, still no call back from Dr. Sharol Given, per rapid patient seems stable, advised to give does of tylenol PRN and continue to monitor.    05/07/22 0948  Vitals  BP (!) 126/47  MAP (mmHg) 68  BP Method Automatic  Pulse Rate 87  Pulse Rate Source Dinamap  Resp 17  MEWS COLOR  MEWS Score Color Green  Oxygen Therapy  SpO2 100 %  O2 Device Nasal Cannula  O2 Flow Rate (L/min) 3 L/min  Pain Assessment  Pain Scale 0-10  Pain Score 9  Pain Type Acute pain  Pain Location Chest  Pain Orientation Left  Pain Descriptors / Indicators Sharp  Pain Frequency Intermittent  Pain Onset Gradual  Patients Stated Pain Goal 3  Pain Intervention(s) Medication (See eMAR)  MEWS Score  MEWS Temp 0  MEWS Systolic 0  MEWS Pulse 0  MEWS RR 0  MEWS LOC 0  MEWS Score 0  Provider Notification  Provider Name/Title Sharol Given  Date Provider Notified 05/07/22  Time Provider Notified [phone]  Method of Notification Call  Notification Reason Other (Comment) (chest  pain)  Provider response Other (Comment) (unable to reach provider, called phone and called [facility])  Rapid Response Notification  Name of Rapid Response RN Notified Hella RN  Date Rapid Response Notified 05/07/22  Time Rapid Response Notified 0955  Note  Observations see note (patient chest pain easing, spokw with rapid, patient states when taking deep breathes pain increases. Waiting call back from [place]. Attempt to call direct line x2 not answer, called office and sent a page. Will continue to monitor. Patient VS stable.)

## 2022-05-07 NOTE — TOC Progression Note (Signed)
Transition of Care [facility]) - Progression Note    Patient Details  Name: Susan [name] MRN: [id number] Date of Birth: Apr 24, 1951  Transition of Care [facility]) CM/SW Contact  Joanne Chars, [place] Phone Number: 05/07/2022, 3:23 PM  Clinical Narrative:     1500:[facility] could offer bed if MWF HD can be set up in [place].  [facility], [place] did not offer beds.  CSW spoke with husband, he would be willing to accept offer at [facility] if HD can be set up.  CSW spoke with Tracy/Renal and updated her on the above.  She will see if MWF HD option in [place] is available.    Expected Discharge Plan: Skilled Nursing Facility Barriers to Discharge: Continued Medical Work up, SNF Pending bed offer  Expected Discharge Plan and Services Expected Discharge Plan: [place] Choice: [name] Living arrangements for the past 2 months: Single Family Home                                       Social Determinants of Health (SDOH) Interventions Housing Interventions: Intervention Not Indicated Transportation Interventions: Intervention Not Indicated Utilities Interventions: Intervention Not Indicated  Readmission Risk Interventions    02/07/2022    2:57 PM 01/23/2022   12:38 PM 05/28/2020   12:50 PM  Readmission Risk Prevention Plan  Transportation Screening Complete Complete Complete  PCP or Specialist Appt within 3-5 Days  Complete   HRI or [place]  Complete   Social Work Consult for [place] Planning/Counseling  Complete   Palliative Care Screening  Not Applicable   Medication Review Press photographer) Complete Complete Complete  PCP or Specialist appointment within 3-5 days of discharge Complete  Complete  HRI or [place] Complete  Complete  SW Recovery Care/Counseling Consult Complete  Complete  Palliative Care Screening Not Applicable  Not [place] Not Applicable  Not  Applicable

## 2022-05-07 NOTE — Progress Notes (Addendum)
  Progress Note    05/07/2022 8:13 AM 2 Days Post-Op  Subjective:  says arm is a little sore   Vitals:   05/07/22 0330 05/07/22 0720  BP: (!) 115/57 (!) 109/48  Pulse: 86 83  Resp: 18   Temp: 97.9 F (36.6 C) 97.8 F (36.6 C)  SpO2: 100% 100%   Physical Exam: Cardiac:  regular Lungs:  non labored Incisions:  left arm incision is intact and well appearing, nylon sutures present. Mild bloody oozing from medial aspect of incision. JP with minimal SS output < 5 cc overnight. JP removed Extremities:  left arm well perfused and warm. 2+ radial pulse. Good thrill in fistula Neurologic: alert and oriented  CBC    Component Value Date/Time   WBC 10.3 05/06/2022 1100   RBC 3.16 (L) 05/06/2022 1100   HGB 7.9 (L) 05/06/2022 1100   HCT 25.8 (L) 05/06/2022 1100   PLT 315 05/06/2022 1100   MCV 81.6 05/06/2022 1100   MCH 25.0 (L) 05/06/2022 1100   MCHC 30.6 05/06/2022 1100   RDW 19.2 (H) 05/06/2022 1100   LYMPHSABS 0.7 05/02/2022 0849   MONOABS 0.7 05/02/2022 0849   EOSABS 0.3 05/02/2022 0849   BASOSABS 0.0 05/02/2022 0849    BMET    Component Value Date/Time   NA 133 (L) 05/06/2022 1110   K 4.3 05/06/2022 1110   CL 95 (L) 05/06/2022 1110   CO2 25 05/06/2022 1110   GLUCOSE 302 (H) 05/06/2022 1110   GLUCOSE >444 (H) 06/07/2015 1311   BUN 77 (H) 05/06/2022 1110   CREATININE 4.65 (H) 05/06/2022 1110   CREATININE 6.22 (H) 03/26/2022 1620   CALCIUM 8.7 (L) 05/06/2022 1110   GFRNONAA 9 (L) 05/06/2022 1110   GFRNONAA 11 (L) 11/15/2020 1452   GFRAA 13 (L) 11/15/2020 1452    INR    Component Value Date/Time   INR 1.5 (H) 02/01/2022 0726     Intake/Output Summary (Last 24 hours) at 05/07/2022 0813 Last data filed at 05/07/2022 0558 Gross per 24 hour  Intake 1015.41 ml  Output 6000 ml  Net -4984.59 ml     Assessment/Plan:  71 y.o. female is s/p I&D infected left av fistula 2 Days Post-Op   Left arm incision well appearing, some oozing from medial aspect of  incision JP drain removed today. Minimal SS ouput Cultures still pending. No growth to date. Continue current Abx Vanc and Ceftazidime Continue Dry gauze dressing, Kerlix and light ACE wrap Will arrange follow up in 2 weeks for incision check/suture removal in our office  Marval Regal [facility] [phone] 05/07/2022 8:13 AM  I have seen and evaluated the patient. I agree with the PA note as documented above. POD#2 s/p I&D left arm AVF for abscess.  Cultures now growing serratia.  Drain removed.  Arm looks much better.  Will need follow-up in 2-3 weeks for suture removal as discussed with her today.  Marty Heck, MD [facility] [place] Office: [phone]

## 2022-05-07 NOTE — Progress Notes (Signed)
Patient is alert and oriented. She states that her chest Pain has resolved.  EKG is similar to prior EKG.  Patient states that the pain was worse with inspiration but has resolved.  Recommended starting with tylenol if pain reoccurs.  Lung sounds with faint crackles in bases. She had HD yesterday  BP 125/47  HR 87  RR 17  O2 sat 100% on 3L [place]

## 2022-05-07 NOTE — Progress Notes (Signed)
Pharmacy Antibiotic Note  Susan [name] is a 71 y.o. female admitted on 05/02/2022 with  wound infection due to infected fistula .  Pharmacy has been consulted for Vancomycin and Ceftazidime dosing.  S/p I&D on 12/11, drain removed today. Operative culture reincubated. R heel osteomyelitis > s/p R BKA on 12/8.  Day #6 antibiotics.  ERSD, usual TTS HD has been on schedule. Last 12/12, next planned 12/14. Vancomycin and Ceftazidime given after HD on 12/12.  Plan: Continue Vancomycin 500 mg TTS after HD Continue Ceftazidime 1gm IV q24h in pm (after HD on HD days) Follow up HD schedule, culture data, clinical progress and antibiotic plans. Goal for pre-dialysis Vanc levels is 15-25 mcg/ml Will consider checking pre-HD vanc level if vanc to continue beyond this week.   Height: '5\' 5"'$  (165.1 cm) Weight: 106.6 kg (235 lb 0.2 oz) IBW/kg (Calculated) : 57  Temp (24hrs), Avg:98 F (36.7 C), Min:97.8 F (36.6 C), Max:98.3 F (36.8 C)  Recent Labs  Lab 05/02/22 0849 05/03/22 0308 05/04/22 0322 05/05/22 1145 05/06/22 1100 05/06/22 1110  WBC 11.9* 15.3* 13.6*  --  10.3  --   CREATININE 2.93* 3.62* 2.84* 4.10*  --  4.65*    ESRD - on TTS HD  Allergies  Allergen Reactions   Keflex [Cephalexin] Diarrhea   Codeine Nausea And Vomiting and Other (See Comments)    Antimicrobials this admission: Cefazolin peri-op x 3 doses 12/8>>12/9 Ceftazidime 12/9 >> Vancomycin 12/9 >> Bacitracin to LLE blisters 12/10>>  Dose adjustments this admission: n/a  Microbiology results:  12/11 abscess L arm: no organisms on gram stain, few WBCs, no growth < 24 hrs to date > now changed to reincubated on 12/13 am  Thank you for allowing pharmacy to be a part of this patient's care.  Arty Baumgartner,  05/07/2022 9:48 AM

## 2022-05-07 NOTE — Plan of Care (Signed)
  Problem: Education: Goal: Ability to describe self-care measures that may prevent or decrease complications (Diabetes Survival Skills Education) will improve Outcome: Progressing   Problem: Skin Integrity: Goal: Risk for impaired skin integrity will decrease Outcome: Progressing   Problem: Education: Goal: Knowledge of the prescribed therapeutic regimen will improve Outcome: Progressing   Problem: Self-Care: Goal: Ability to meet self-care needs will improve Outcome: Progressing   Problem: Pain Management: Goal: Pain level will decrease with appropriate interventions Outcome: Progressing   Problem: Skin Integrity: Goal: Demonstration of wound healing without infection will improve Outcome: Progressing   Problem: Education: Goal: Knowledge of General Education information will improve Description: Including pain rating scale, medication(s)/side effects and non-pharmacologic comfort measures Outcome: Progressing   Problem: Activity: Goal: Risk for activity intolerance will decrease Outcome: Progressing   Problem: Pain Managment: Goal: General experience of comfort will improve Outcome: Progressing   Problem: Safety: Goal: Ability to remain free from injury will improve Outcome: Progressing   Problem: Skin Integrity: Goal: Risk for impaired skin integrity will decrease Outcome: Progressing

## 2022-05-07 NOTE — Progress Notes (Signed)
Patient ID: Susan [name], female   DOB: [birth year], 71 y.o.   MRN: [id number] Patient is status post right below-knee amputation and irrigation debridement left forearm.  Will plan for discharge to skilled nursing when her forearm wound has stabilized.

## 2022-05-07 NOTE — Progress Notes (Signed)
Physical Therapy Treatment Patient Details Name: Susan [name] MRN: [id number] DOB: 08/28/1950 Today's Date: 05/07/2022   History of Present Illness Pt is a 71 y.o. female admitted 05/02/22 with R heel gangrene. 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 PMH includes recent admission 03/2022 with R heel drainage AKI; other PMH includes DM2, CHF, HTN, CAD, CKD, MI, BLE venous insufficiency, anxiety.    PT Comments    Pt was seen for mobility and declined off the bed, but was able to move with help on BLE's.  Also helped pt don the limb protector, which is much more comfortable for her per her report.  Pt is appropriate for SNF follow up given generalized weakness, her pain that remains and her need to build up mobility to qualify for a prosthetic fitting.  Follow acutely for goals of PT as are outlined on POC.   Recommendations for follow up therapy are one component of a multi-disciplinary discharge planning process, led by the attending physician.  Recommendations may be updated based on patient status, additional functional criteria and insurance authorization.  Follow Up Recommendations  Skilled nursing-short term rehab (<3 hours/day) Can patient physically be transported by private vehicle: No   Assistance Recommended at Discharge Intermittent Supervision/Assistance  Patient can return home with the following Two people to help with walking and/or transfers;A lot of help with bathing/dressing/bathroom;Assistance with cooking/housework;Direct supervision/assist for medications management;Direct supervision/assist for financial management;Assist for transportation;Help with stairs or ramp for entrance   Equipment Recommendations  Other (comment) (bariatric wheelchair, hoyer lift, hospital bed)    Recommendations for Other Services       Precautions / Restrictions Precautions Precautions: Fall Precaution Comments: R BKA wound vac; urine incontinence; decreased skin integrity; wears O2 at night Required Braces or  Orthoses: Other Brace Other Brace: R BKA limb protector Restrictions Weight Bearing Restrictions: Yes RLE Weight Bearing: Non weight bearing     Mobility  Bed Mobility Overal bed mobility: Needs Assistance Bed Mobility: Rolling Rolling: Total assist         General bed mobility comments: pt is not focused on directional cues and requires repetitive instructions    Transfers                   General transfer comment: refused    Ambulation/Gait                   Stairs             Wheelchair Mobility    Modified Rankin (Stroke Patients Only)       Balance                                            Cognition Arousal/Alertness: Awake/alert, Lethargic Behavior During Therapy: Anxious, Flat affect Overall Cognitive Status: Impaired/Different from baseline Area of Impairment: Attention, Memory, Safety/judgement, Awareness, Problem solving                   Current Attention Level: Selective, Alternating Memory: Decreased short-term memory, Decreased recall of precautions Following Commands: Follows one step commands with increased time, Follows one step commands inconsistently Safety/Judgement: Decreased awareness of safety, Decreased awareness of deficits Awareness: Intellectual Problem Solving: Slow processing, Decreased initiation General Comments: lethargic possibly from meds        Exercises Amputee Exercises Quad Sets: AROM, Both, 10 reps Hip Extension: AROM, 10 reps Hip ABduction/ADduction: AAROM, 10  reps Hip Flexion/Marching: Left, 10 reps Knee Flexion: AROM, Left, 10 reps Straight Leg Raises: AAROM, Both, 10 reps    General Comments General comments (skin integrity, edema, etc.): pt was assisted to move LE's with low effort ongoing, requiring repeated instructions but did verbalize liking the RLE limb protector to ease symptoms and improve active use of RLE      Pertinent Vitals/Pain Pain  Assessment Pain Assessment: Faces Pain Location: R residual limb Pain Descriptors / Indicators: Grimacing, Guarding Pain Intervention(s): Limited activity within patient's tolerance, Monitored during session, Premedicated before session, Repositioned    Home Living                          Prior Function            PT Goals (current goals can now be found in the care plan section) Acute Rehab PT Goals Patient Stated Goal: manage pain    Frequency    Min 2X/week      PT Plan Current plan remains appropriate    Co-evaluation              AM-PAC PT "6 Clicks" Mobility   Outcome Measure  Help needed turning from your back to your side while in a flat bed without using bedrails?: Total Help needed moving from lying on your back to sitting on the side of a flat bed without using bedrails?: Total Help needed moving to and from a bed to a chair (including a wheelchair)?: Total Help needed standing up from a chair using your arms (e.g., wheelchair or bedside chair)?: Total Help needed to walk in hospital room?: Total Help needed climbing 3-5 steps with a railing? : Total 6 Click Score: 6    End of Session   Activity Tolerance: Patient limited by pain;Patient limited by fatigue Patient left: in bed;with call bell/phone within reach Nurse Communication: Mobility status PT Visit Diagnosis: Other abnormalities of gait and mobility (R26.89);Muscle weakness (generalized) (M62.81);Pain Pain - Right/Left: Right Pain - part of body: Leg     Time: 1209-1233 PT Time Calculation (min) (ACUTE ONLY): 24 min  Charges:  $Therapeutic Exercise: 8-22 mins $Therapeutic Activity: 8-22 mins  Ramond Dial 05/07/2022, 1:20 PM  Mee Hives, PT PhD Acute Rehab Dept. Number: [place] [facility] [place]

## 2022-05-07 NOTE — Consult Note (Signed)
[facility] CM Inpatient Consult   05/07/2022  Susan [name] [birth year] [id number]  [facility] [[name]]  Accountable Care Organization [ACO] Patient: [employer]  Primary Care Provider:  Donita Brooks, MD, [name]   Patient screened for less than 30 days readmission hospitalization with noted extreme high risk score for unplanned readmission risk and to assess for potential [facility]  [[name]] Care Management service needs for post hospital transition for care coordination.  Review of patient's electronic medical record is for a skilled nursing level of care and also listed in PING/Bamboo reveals patient is active with care management with [facility].    Plan:  Patient needs for care coordination is followed by [facility] and post hospital for a skilled nursing facility level of care.  Will sign off as care management needs is from [facility].    For questions contact:   [name], RN BSN CCM [facility]  [phone] business mobile phone Toll free office [phone]  *Concierge Line  [phone] Fax number: [phone] [email or web address]

## 2022-05-07 NOTE — Progress Notes (Signed)
[facility] Progress Note   Subjective:   3L UF with HD yesterday. Was a bit short of breath last night and this AM. Denies CP, palpitations, dizziness and nausea.   Objective Vitals:   05/06/22 1338 05/06/22 1930 05/07/22 0330 05/07/22 0720  BP: (!) 105/38 (!) 109/59 (!) 115/57 (!) 109/48  Pulse: 91 88 86 83  Resp: '14 18 18   '$ Temp: 98.3 F (36.8 C) 98.2 F (36.8 C) 97.9 F (36.6 C) 97.8 F (36.6 C)  TempSrc: Oral Oral Oral Oral  SpO2: 99% 100% 100% 100%  Weight:      Height:       Physical Exam General: Alert female in NAD Heart: RRR, no murmurs, rubs or gallops Lungs: CTA bilaterally, no wheezing, rhonchi or rales Abdomen: Soft, non-distended +BS Extremities: Trace edema b/l lower extremities, LUE AVF wrapped Dialysis Access: [facility]   Additional Objective Labs: Basic Metabolic Panel: Recent Labs  Lab 05/03/22 0308 05/04/22 0322 05/05/22 1145 05/06/22 1110  NA 136 135 126* 133*  K 3.4* 4.0 4.1 4.3  CL 94* 93* 98 95*  CO2 28 28  --  25  GLUCOSE 168* 99 261* 302*  BUN 41* 33* 57* 77*  CREATININE 3.62* 2.84* 4.10* 4.65*  CALCIUM 8.5* 8.5*  --  8.7*  PHOS 3.6  --   --  5.0*   Liver Function Tests: Recent Labs  Lab 05/02/22 0849 05/06/22 1110  AST 12*  --   ALT 10  --   ALKPHOS 162*  --   BILITOT 0.4  --   PROT 5.9*  --   ALBUMIN 2.0* 1.7*   No results for input(s): "LIPASE", "AMYLASE" in the last 168 hours. CBC: Recent Labs  Lab 05/02/22 0849 05/03/22 0308 05/04/22 0322 05/05/22 1145 05/06/22 1100  WBC 11.9* 15.3* 13.6*  --  10.3  NEUTROABS 10.0*  --   --   --   --   HGB 8.7* 9.4* 9.6* 9.2* 7.9*  HCT 30.1* 31.5* 32.1* 27.0* 25.8*  MCV 81.4 82.5 82.9  --  81.6  PLT 344 310 257  --  315   Blood Culture    Component Value Date/Time   SDES ABSCESS LEFT ARM 05/05/2022 1313   SPECREQUEST NONE 05/05/2022 1313   CULT  05/05/2022 1313    NO GROWTH < 24 HOURS Performed at [facility] [address], [place], [place]  [address]    REPTSTATUS PENDING 05/05/2022 1313    Cardiac Enzymes: No results for input(s): "CKTOTAL", "CKMB", "CKMBINDEX", "TROPONINI" in the last 168 hours. CBG: Recent Labs  Lab 05/06/22 1401 05/06/22 1609 05/06/22 1933 05/07/22 0318 05/07/22 0739  GLUCAP 161* 150* 146* 146* 266*   Iron Studies: No results for input(s): "IRON", "TIBC", "TRANSFERRIN", "FERRITIN" in the last 72 hours. '@lablastinr3'$ @ Studies/Results: No results found. Medications:  cefTAZidime (FORTAZ)  IV 1 g (05/06/22 1715)   vancomycin Stopped (05/06/22 1412)    (feeding supplement) PROSource Plus  30 mL Oral BID BM   amiodarone  200 mg Oral Daily   vitamin C  1,000 mg Oral Daily   aspirin EC  81 mg Oral Q breakfast   bacitracin   Topical Daily   Chlorhexidine Gluconate Cloth  6 each Topical Q0600   docusate sodium  100 mg Oral Daily   Gerhardt's butt cream   Topical TID   insulin aspart  0-5 Units Subcutaneous QHS   insulin aspart  0-9 Units Subcutaneous TID WC   insulin aspart  3 Units Subcutaneous TID WC   insulin glargine-yfgn  28 Units Subcutaneous Daily   levothyroxine  50 mcg Oral Q0600   linagliptin  5 mg Oral Daily   nutrition supplement (JUVEN)  1 packet Oral BID BM   pantoprazole  40 mg Oral Daily   pregabalin  75 mg Oral Daily   sevelamer carbonate  1,600 mg Oral TID WC   vortioxetine HBr  20 mg Oral Daily   zinc sulfate  220 mg Oral Daily    Outpatient Dialysis Orders: TTS at [facility] - fairly new, just started HD 03/2022 4hr, 400/600, EDW 113.5kg, 2K/2Ca, TDC, heparin 2000 unit bolus - Mircera 183mg IV q 2 weeks (last given 11/30) - Venofer '100mg'$  x 10 ordered - Calcitriol 0.575m PO q HD  Assessment/Plan:  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 DuSharol Given.  LUE AVF wound infection: Erythema/warmth and serous drainage. VVS following - s/p I&D.  On vanc and ceftaz per pharm dosing.  3.  ESRD:  Continue HD on usual TTS schedule 4.  Hypertension/volume: BP stable, chronically  overloaded, volume status improving and weight down significantly but still having some SOB, will continue to challenge EDW 5.  Anemia: Hgb 7.9- ordered aranesp  6.  Metabolic bone disease: Ca/Phos ok - no home binders, continue VDRA. 7.  Nutrition:  Alb low, added supplements. 8.  T2DM 9.  A-fib - eliquis on hold for surgery 12/11   SaAnice PaganiniPA-C 05/07/2022, 9:03 AM  [facility] Pager: [phone]

## 2022-05-08 LAB — RENAL FUNCTION PANEL
Albumin: 1.8 g/dL — ABNORMAL LOW (ref 3.5–5.0)
Anion gap: 13 (ref 5–15)
BUN: 75 mg/dL — ABNORMAL HIGH (ref 8–23)
CO2: 26 mmol/L (ref 22–32)
Calcium: 8.9 mg/dL (ref 8.9–10.3)
Chloride: 92 mmol/L — ABNORMAL LOW (ref 98–111)
Creatinine, Ser: 4.68 mg/dL — ABNORMAL HIGH (ref 0.44–1.00)
GFR, Estimated: 9 mL/min — ABNORMAL LOW (ref >60)
Glucose, Bld: 318 mg/dL — ABNORMAL HIGH (ref 70–99)
Phosphorus: 4 mg/dL (ref 2.5–4.6)
Potassium: 4.1 mmol/L (ref 3.5–5.1)
Sodium: 131 mmol/L — ABNORMAL LOW (ref 135–145)

## 2022-05-08 LAB — CBC
HCT: 27.1 % — ABNORMAL LOW (ref 36.0–46.0)
HCT: 28 % — ABNORMAL LOW (ref 36.0–46.0)
Hemoglobin: 8.2 g/dL — ABNORMAL LOW (ref 12.0–15.0)
Hemoglobin: 8.2 g/dL — ABNORMAL LOW (ref 12.0–15.0)
MCH: 24.7 pg — ABNORMAL LOW (ref 26.0–34.0)
MCH: 24.2 pg — ABNORMAL LOW (ref 26.0–34.0)
MCHC: 30.3 g/dL (ref 30.0–36.0)
MCHC: 29.3 g/dL — ABNORMAL LOW (ref 30.0–36.0)
MCV: 81.6 fL (ref 80.0–100.0)
MCV: 82.6 fL (ref 80.0–100.0)
Platelets: 338 10*3/uL (ref 150–400)
Platelets: 366 10*3/uL (ref 150–400)
RBC: 3.32 MIL/uL — ABNORMAL LOW (ref 3.87–5.11)
RBC: 3.39 MIL/uL — ABNORMAL LOW (ref 3.87–5.11)
RDW: 18.9 % — ABNORMAL HIGH (ref 11.5–15.5)
RDW: 19.1 % — ABNORMAL HIGH (ref 11.5–15.5)
WBC: 8.2 10*3/uL (ref 4.0–10.5)
WBC: 9.2 10*3/uL (ref 4.0–10.5)
nRBC: 0 % (ref 0.0–0.2)
nRBC: 0 % (ref 0.0–0.2)

## 2022-05-08 LAB — GLUCOSE, CAPILLARY
Glucose-Capillary: 379 mg/dL — ABNORMAL HIGH (ref 70–99)
Glucose-Capillary: 355 mg/dL — ABNORMAL HIGH (ref 70–99)
Glucose-Capillary: 355 mg/dL — ABNORMAL HIGH (ref 70–99)
Glucose-Capillary: 206 mg/dL — ABNORMAL HIGH (ref 70–99)

## 2022-05-08 MED ORDER — ALPRAZOLAM 0.5 MG PO TABS
0.5000 mg | ORAL_TABLET | Freq: Three times a day (TID) | ORAL | Status: DC | PRN
  Administered 2022-05-08: 0.5 mg via ORAL
  Filled 2022-05-08: qty 1

## 2022-05-08 MED ORDER — OXYCODONE-ACETAMINOPHEN 5-325 MG PO TABS: 1.0000 | ORAL_TABLET | ORAL | 0 refills | Status: DC | PRN

## 2022-05-08 MED ORDER — APIXABAN 5 MG PO TABS
5.0000 mg | ORAL_TABLET | Freq: Two times a day (BID) | ORAL | Status: DC
  Administered 2022-05-08 – 2022-05-12 (×8): 5 mg via ORAL
  Filled 2022-05-08 (×9): qty 1

## 2022-05-08 MED ORDER — HEPARIN SODIUM (PORCINE) 1000 UNIT/ML IJ SOLN
INTRAMUSCULAR | Status: AC
  Administered 2022-05-08: 2000 [IU]
  Filled 2022-05-08: qty 2

## 2022-05-08 NOTE — Progress Notes (Signed)
Patient ID: Susan [name], female   DOB: 09-26-1950, 71 y.o.   MRN: [id number] Patient without complaints this morning status post right below-knee amputation.  There is no drainage in the wound VAC canister there is a good suction fit.  Anticipate discharge to [facility] once dialysis is arranged.  Plan for discharge with the Praveena plus portable wound VAC pump.

## 2022-05-08 NOTE — Progress Notes (Signed)
  Progress Note    05/08/2022 7:48 AM 3 Days Post-Op  Subjective:  no complaints. Some arm soreness   Vitals:   05/07/22 2154 05/08/22 0453  BP: (!) 137/53 (!) 121/44  Pulse: 73 88  Resp:    Temp: 98.2 F (36.8 C) 98 F (36.7 C)  SpO2: 100% 100%   Physical Exam: Cardiac:  regular Lungs:  non labored Incisions:  left arm incision with vicryl sutures intact and well appearing Extremities:  left arm is well perfused and warm with palpable left radial pulse. Forearm edematous.  Neurologic: alert and oriented  CBC    Component Value Date/Time   WBC 8.2 05/08/2022 0339   RBC 3.32 (L) 05/08/2022 0339   HGB 8.2 (L) 05/08/2022 0339   HCT 27.1 (L) 05/08/2022 0339   PLT 338 05/08/2022 0339   MCV 81.6 05/08/2022 0339   MCH 24.7 (L) 05/08/2022 0339   MCHC 30.3 05/08/2022 0339   RDW 18.9 (H) 05/08/2022 0339   LYMPHSABS 0.7 05/02/2022 0849   MONOABS 0.7 05/02/2022 0849   EOSABS 0.3 05/02/2022 0849   BASOSABS 0.0 05/02/2022 0849    BMET    Component Value Date/Time   NA 133 (L) 05/06/2022 1110   K 4.3 05/06/2022 1110   CL 95 (L) 05/06/2022 1110   CO2 25 05/06/2022 1110   GLUCOSE 302 (H) 05/06/2022 1110   GLUCOSE >444 (H) 06/07/2015 1311   BUN 77 (H) 05/06/2022 1110   CREATININE 4.65 (H) 05/06/2022 1110   CREATININE 6.22 (H) 03/26/2022 1620   CALCIUM 8.7 (L) 05/06/2022 1110   GFRNONAA 9 (L) 05/06/2022 1110   GFRNONAA 11 (L) 11/15/2020 1452   GFRAA 13 (L) 11/15/2020 1452    INR    Component Value Date/Time   INR 1.5 (H) 02/01/2022 0726     Intake/Output Summary (Last 24 hours) at 05/08/2022 0748 Last data filed at 05/08/2022 0534 Gross per 24 hour  Intake 357 ml  Output 0 ml  Net 357 ml     Assessment/Plan:  71 y.o. female is s/p I&D infected left av fistula 3 Days Post-Op   Left arm incision is well appearing. Dry Okay to start Eliquis Cx growing serratia.  Will arrange outpatient Abx with HD Okay to discharge from vascular standpoint Will follow  up in our office in 2-3 weeks for incision check   Susan Caldwell, PA-C [facility] [phone] 05/08/2022 7:48 AM

## 2022-05-08 NOTE — Progress Notes (Signed)
   05/08/22 1811  Vitals  Temp 98.3 F (36.8 C)  Temp Source Oral  BP Location Right Wrist  BP Method Automatic  Patient Position (if appropriate) Lying  Pulse Rate 81  Pulse Rate Source Monitor  ECG Heart Rate 82  Resp 14  Oxygen Therapy  SpO2 100 %  O2 Device Nasal Cannula  O2 Flow Rate (L/min) 3 L/min  During Treatment Monitoring  HD Safety Checks Performed Yes  Intra-Hemodialysis Comments Tx completed;Tolerated well  Post Treatment  Dialyzer Clearance Lightly streaked  Duration of HD Treatment -hour(s) 3 hour(s)  Hemodialysis Intake (mL) 0 mL  Liters Processed 72  Fluid Removed (mL) 3000 mL  Tolerated HD Treatment Yes  Post-Hemodialysis Comments TX complete. Tolerated well. No complications  Hemodialysis Catheter Right Subclavian Double lumen Permanent (Tunneled)  No placement date or time found.   Orientation: Right  Access Location: Subclavian  Hemodialysis Catheter Type: Double lumen Permanent (Tunneled)  Site Condition No complications  Blue Lumen Status Dead end cap in place;Blood return noted  Red Lumen Status Dead end cap in place;Blood return noted  Purple Lumen Status N/A  Catheter fill solution Heparin 1000 units/ml  Catheter fill volume (Arterial) 1.9 cc  Catheter fill volume (Venous) 1.9  Dressing Type Transparent  Dressing Status Antimicrobial disc in place;Clean, Dry, Intact  Drainage Description None  Dressing Change Due 05/10/22  Post treatment catheter status Capped and Clamped

## 2022-05-08 NOTE — Progress Notes (Signed)
[facility] Progress Note   Subjective:   Reports feeling more SOB today, on O2 3L via [place]. Had a rapid response called yesterday for CP but resolved spontaneously. No CP, dizziness. Mild nausea with meals. Planned for HD second shift today.   Objective Vitals:   05/07/22 1547 05/07/22 2154 05/08/22 0453 05/08/22 0823  BP: (!) 101/39 (!) 137/53 (!) 121/44 (!) 109/35  Pulse: 65 73 88 82  Resp:      Temp: 98 F (36.7 C) 98.2 F (36.8 C) 98 F (36.7 C) 97.8 F (36.6 C)  TempSrc: Oral Oral Oral Oral  SpO2: 100% 100% 100% 100%  Weight:      Height:       Physical Exam General: Alert female in NAD Heart: RRR, no murmurs, rubs or gallops Lungs: + crackles bilateral lower lobes. Respirations unlabored  Abdomen: Soft, non-distended +BS Extremities: Trace edema b/l lower extremities, LUE AVF wrapped Dialysis Access: [facility]    Additional Objective Labs: Basic Metabolic Panel: Recent Labs  Lab 05/03/22 0308 05/04/22 0322 05/05/22 1145 05/06/22 1110  NA 136 135 126* 133*  K 3.4* 4.0 4.1 4.3  CL 94* 93* 98 95*  CO2 28 28  --  25  GLUCOSE 168* 99 261* 302*  BUN 41* 33* 57* 77*  CREATININE 3.62* 2.84* 4.10* 4.65*  CALCIUM 8.5* 8.5*  --  8.7*  PHOS 3.6  --   --  5.0*   Liver Function Tests: Recent Labs  Lab 05/02/22 0849 05/06/22 1110  AST 12*  --   ALT 10  --   ALKPHOS 162*  --   BILITOT 0.4  --   PROT 5.9*  --   ALBUMIN 2.0* 1.7*   No results for input(s): "LIPASE", "AMYLASE" in the last 168 hours. CBC: Recent Labs  Lab 05/02/22 0849 05/03/22 0308 05/04/22 0322 05/05/22 1145 05/06/22 1100 05/08/22 0339 05/08/22 0750  WBC 11.9* 15.3* 13.6*  --  10.3 8.2 9.2  NEUTROABS 10.0*  --   --   --   --   --   --   HGB 8.7* 9.4* 9.6*   < > 7.9* 8.2* 8.2*  HCT 30.1* 31.5* 32.1*   < > 25.8* 27.1* 28.0*  MCV 81.4 82.5 82.9  --  81.6 81.6 82.6  PLT 344 310 257  --  315 338 366   < > = values in this interval not displayed.   Blood Culture     Component Value Date/Time   SDES ABSCESS LEFT ARM 05/05/2022 1313   SPECREQUEST NONE 05/05/2022 1313   CULT  05/05/2022 1313    RARE SERRATIA MARCESCENS CULTURE REINCUBATED FOR BETTER GROWTH NO ANAEROBES ISOLATED; CULTURE IN PROGRESS FOR 5 DAYS    REPTSTATUS PENDING 05/05/2022 1313    Cardiac Enzymes: No results for input(s): "CKTOTAL", "CKMB", "CKMBINDEX", "TROPONINI" in the last 168 hours. CBG: Recent Labs  Lab 05/07/22 0739 05/07/22 1132 05/07/22 1652 05/07/22 2003 05/08/22 0824  GLUCAP 266* 274* 173* 181* 379*   Iron Studies: No results for input(s): "IRON", "TIBC", "TRANSFERRIN", "FERRITIN" in the last 72 hours. '@lablastinr3'$ @ Studies/Results: No results found. Medications:  cefTAZidime (FORTAZ)  IV 1 g (05/07/22 1724)   vancomycin Stopped (05/06/22 1412)    (feeding supplement) PROSource Plus  30 mL Oral BID BM   amiodarone  200 mg Oral Daily   apixaban  5 mg Oral BID   vitamin C  1,000 mg Oral Daily   aspirin EC  81 mg Oral Q breakfast  bacitracin   Topical Daily   Chlorhexidine Gluconate Cloth  6 each Topical Q0600   darbepoetin (ARANESP) injection - DIALYSIS  100 mcg Subcutaneous Q Thu-1800   docusate sodium  100 mg Oral Daily   Gerhardt's butt cream   Topical TID   insulin aspart  0-5 Units Subcutaneous QHS   insulin aspart  0-9 Units Subcutaneous TID WC   insulin aspart  3 Units Subcutaneous TID WC   insulin glargine-yfgn  28 Units Subcutaneous Daily   levothyroxine  50 mcg Oral Q0600   linagliptin  5 mg Oral Daily   nutrition supplement (JUVEN)  1 packet Oral BID BM   pantoprazole  40 mg Oral Daily   pregabalin  75 mg Oral Daily   sevelamer carbonate  1,600 mg Oral TID WC   vortioxetine HBr  20 mg Oral Daily   zinc sulfate  220 mg Oral Daily    Outpatient Dialysis Orders: TTS at [facility] - fairly new, just started HD 03/2022 4hr, 400/600, EDW 113.5kg, 2K/2Ca, TDC, heparin 2000 unit bolus - Mircera 156mg IV q 2 weeks (last given 11/30) - Venofer  '100mg'$  x 10 ordered - Calcitriol 0.575m PO q HD  Assessment/Plan:  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 DuSharol Given.  LUE AVF wound infection: Erythema/warmth and serous drainage. VVS following - s/p I&D.  On vanc and ceftaz per pharm dosing.  3.  ESRD:  Continue HD on usual TTS schedule 4.  Hypertension/volume: BP stable, chronically overloaded, progressively lowering EDW this admission 5.  Anemia: Hgb 8.2- continue aranesp 6.  Metabolic bone disease: Ca/Phos ok - no home binders, continue VDRA. 7.  Nutrition:  Alb low, added supplements. 8.  T2DM: mgt per admitting team 9.  A-fib - eliquis on hold for surgery 12/11, now resumed    SaAnice PaganiniPA-C 05/08/2022, 8:49 AM  [facility] Pager: [phone]

## 2022-05-08 NOTE — Progress Notes (Signed)
OT Cancellation Note  Patient Details Name: Tymara Saur MRN: [id number] DOB: June 04, 1950   Cancelled Treatment:    Reason Eval/Treat Not Completed: Patient at procedure or test/ unavailable (Pt taken to HD at time that therapy arrived to room.)  Ailene Ravel, OTR/L,CBIS  Supplemental OT - [place] and [facility]  05/08/2022, 2:44 PM

## 2022-05-08 NOTE — Plan of Care (Signed)
  Problem: Education: Goal: Ability to describe self-care measures that may prevent or decrease complications (Diabetes Survival Skills Education) will improve Outcome: Not Progressing Goal: Individualized Educational Video(s) Outcome: Not Progressing   Problem: Coping: Goal: Ability to adjust to condition or change in health will improve Outcome: Not Progressing   Problem: Fluid Volume: Goal: Ability to maintain a balanced intake and output will improve Outcome: Not Progressing   Problem: Health Behavior/Discharge Planning: Goal: Ability to identify and utilize available resources and services will improve Outcome: Not Progressing Goal: Ability to manage health-related needs will improve Outcome: Not Progressing

## 2022-05-08 NOTE — Progress Notes (Addendum)
Contacted CSW this am to inquire if snf would be able to transport to [facility] on MWF 11:45 chair time. CSW to check with snf.   Melven Sartorius Renal Navigator [phone]  Addendum at 4:18 pm: SNF requested at TTS at [facility]. Pt has been accepted at [facility] on TTS 7:15 chair time. Pt will need to arrive at 7:00 and pt can start on Saturday if d/c to snf tomorrow. Update provided to CSW and renal staff. Will add arrangements to AVS.

## 2022-05-08 NOTE — Discharge Summary (Signed)
Discharge Diagnoses:  Principal Problem:   S/P BKA (below knee amputation) unilateral, right ([place]) Active Problems:   Gangrene of right foot ([place])   Surgeries: Procedure(s): IRRIGATION AND DEBRIDEMENT LEFT ARM AV FISTULA on 05/05/2022    Consultants: Treatment Team:  Marty Heck, MD Darliss Cheney, MD Roney Jaffe, MD  Discharged Condition: Improved  Hospital Course: Susan [name] is an 71 y.o. female who was admitted 05/02/2022 with a chief complaint of osteomyelitis right foot, with a final diagnosis of osteomyelitis right foot, Infected left arm Arteriovenous fistula.  Patient was brought to the operating room on 05/05/2022 and underwent Procedure(s): IRRIGATION AND DEBRIDEMENT LEFT ARM AV FISTULA.    Patient was given perioperative antibiotics:  Anti-infectives (From admission, onward)    Start     Dose/Rate Route Frequency Ordered Stop   05/06/22 1200  vancomycin (VANCOREADY) IVPB 500 mg/100 mL        500 mg 100 mL/hr over 60 Minutes Intravenous Every T-Th-Sa (Hemodialysis) 05/03/22 1037     05/03/22 1430  vancomycin (VANCOREADY) IVPB 1500 mg/300 mL        1,500 mg 150 mL/hr over 120 Minutes Intravenous  Once 05/03/22 1037 05/03/22 1520   05/03/22 1400  cefTAZidime (FORTAZ) 1 g in sodium chloride 0.9 % 100 mL IVPB        1 g 200 mL/hr over 30 Minutes Intravenous Every 24 hours 05/03/22 1037     05/02/22 1800  ceFAZolin (ANCEF) IVPB 2g/100 mL premix        2 g 200 mL/hr over 30 Minutes Intravenous Every 8 hours 05/02/22 1350 05/03/22 0241   05/02/22 1445  fidaxomicin (DIFICID) tablet 200 mg  Status:  Discontinued        200 mg Oral 2 times daily 05/02/22 1349 05/02/22 1355   05/02/22 0745  ceFAZolin (ANCEF) IVPB 2g/100 mL premix        2 g 200 mL/hr over 30 Minutes Intravenous On call to O.R. 05/02/22 5573 05/02/22 0944     .  Patient was given sequential compression devices, early ambulation, and aspirin for DVT prophylaxis.  Recent vital signs: Patient  Vitals for the past 24 hrs:  BP Temp Temp src Pulse Resp SpO2  05/08/22 0453 (!) 121/44 98 F (36.7 C) Oral 88 -- 100 %  05/07/22 2154 (!) 137/53 98.2 F (36.8 C) Oral 73 -- 100 %  05/07/22 1547 (!) 101/39 98 F (36.7 C) Oral 65 -- 100 %  05/07/22 1001 (!) 107/44 -- -- 86 -- 100 %  05/07/22 0948 (!) 126/47 -- -- 87 17 100 %  05/07/22 0720 (!) 109/48 97.8 F (36.6 C) Oral 83 -- 100 %  .  Recent laboratory studies: No results found.  Discharge Medications:   Allergies as of 05/08/2022       Reactions   Keflex [cephalexin] Diarrhea   Codeine Nausea And Vomiting, Other (See Comments)        Medication List     TAKE these medications    albuterol 108 (90 Base) MCG/ACT inhaler Commonly known as: VENTOLIN HFA Inhale 1-2 puffs into the lungs every 6 (six) hours as needed for wheezing or shortness of breath.   amiodarone 200 MG tablet Commonly known as: PACERONE Take 1 tablet (200 mg total) by mouth daily.   apixaban 5 MG Tabs tablet Commonly known as: ELIQUIS Take 1 tablet (5 mg total) by mouth 2 (two) times daily.   aspirin EC 81 MG tablet Take 1 tablet (81 mg total)  by mouth daily with breakfast.   DIALYVITE TABLET Tabs Take 1 tablet by mouth daily.   diphenoxylate-atropine 2.5-0.025 MG tablet Commonly known as: Lomotil Take 1 tablet by mouth 4 (four) times daily as needed for diarrhea or loose stools.   ferrous sulfate 325 (65 FE) MG tablet Take 325 mg by mouth 2 (two) times daily.   fidaxomicin 200 MG Tabs tablet Commonly known as: DIFICID Take 1 tablet (200 mg total) by mouth 2 (two) times daily.   FreeStyle Libre 2 Sensor Misc   hydrALAZINE 25 MG tablet Commonly known as: APRESOLINE Take 1 tablet (25 mg total) by mouth every 6 (six) hours as needed (SBP>150 or DBP>100).   insulin pump Soln Inject 1 each into the skin 3 times daily with meals, bedtime and 2 AM.   levothyroxine 50 MCG tablet Commonly known as: SYNTHROID Take 1 tablet (50 mcg total)  by mouth daily before breakfast.   linagliptin 5 MG Tabs tablet Commonly known as: TRADJENTA Take 1 tablet (5 mg total) by mouth daily.   magnesium oxide 400 (240 Mg) MG tablet Commonly known as: MAG-OX Take 1 tablet (400 mg total) by mouth daily.   NovoLIN R ReliOn 100 units/mL injection Generic drug: insulin regular Inject into the skin See admin instructions. Via Insulin Pump   OneTouch Delica Plus [id number] Misc Use as directed to check blood sugars as need up to 4 times daily   OneTouch Verio Flex System w/Device Kit Use up to four times daily as directed.   oxyCODONE-acetaminophen 5-325 MG tablet Commonly known as: PERCOCET/ROXICET Take 1 tablet by mouth every 4 (four) hours as needed. What changed: reasons to take this   OXYGEN Inhale 2 L/min into the lungs at bedtime.   Pentips 32G X 4 MM Misc Generic drug: Insulin Pen Needle Use as directed   pregabalin 75 MG capsule Commonly known as: LYRICA Take 1 capsule (75 mg total) by mouth daily.   sevelamer carbonate 800 MG tablet Commonly known as: RENVELA Take 1,600 mg by mouth 3 (three) times daily with meals.   vortioxetine HBr 10 MG Tabs tablet Commonly known as: Trintellix Take 2 tablets (20 mg total) by mouth daily.        Diagnostic Studies: No results found.  Patient benefited maximally from their hospital stay and there were no complications.     Disposition: Discharge disposition: 62-Rehab Facility      Discharge Instructions     Call MD / Call 911   Complete by: As directed    If you experience chest pain or shortness of breath, CALL 911 and be transported to the hospital emergency room.  If you develope a fever above 101 F, pus (white drainage) or increased drainage or redness at the wound, or calf pain, call your surgeon's office.   Constipation Prevention   Complete by: As directed    Drink plenty of fluids.  Prune juice may be helpful.  You may use a stool softener, such as Colace  (over the counter) 100 mg twice a day.  Use MiraLax (over the counter) for constipation as needed.   Diet - low sodium heart healthy   Complete by: As directed    Increase activity slowly as tolerated   Complete by: As directed    Negative Pressure Wound Therapy - Incisional   Complete by: As directed    Post-operative opioid taper instructions:   Complete by: As directed    POST-OPERATIVE OPIOID TAPER INSTRUCTIONS: It is important to  wean off of your opioid medication as soon as possible. If you do not need pain medication after your surgery it is ok to stop day one. Opioids include: Codeine, Hydrocodone(Norco, Vicodin), Oxycodone(Percocet, oxycontin) and hydromorphone amongst others.  Long term and even short term use of opiods can cause: Increased pain response Dependence Constipation Depression Respiratory depression And more.  Withdrawal symptoms can include Flu like symptoms Nausea, vomiting And more Techniques to manage these symptoms Hydrate well Eat regular healthy meals Stay active Use relaxation techniques(deep breathing, meditating, yoga) Do Not substitute Alcohol to help with tapering If you have been on opioids for less than two weeks and do not have pain than it is ok to stop all together.  Plan to wean off of opioids This plan should start within one week post op of your joint replacement. Maintain the same interval or time between taking each dose and first decrease the dose.  Cut the total daily intake of opioids by one tablet each day Next start to increase the time between doses. The last dose that should be eliminated is the evening dose.          Follow-up Information     Newt Minion, MD Follow up in 1 week(s).   Specialty: Orthopedic Surgery Contact information: [address] [place] [address] [phone]         [facility] Follow up in 2 week(s).   Why: The office will call the patient with an  appointment Contact information: [address] [place] [phone]                 Signed: Newt Minion 05/08/2022, 7:06 AM

## 2022-05-08 NOTE — TOC Progression Note (Addendum)
Transition of Care [facility]) - Progression Note    Patient Details  Name: Susan [name] MRN: [id number] Date of Birth: 19-Apr-1951  [facility] CM/SW Contact  Joanne Chars, LCSW Phone Number: 05/08/2022, 10:19 AM  Clinical Narrative:   HD schedule at [facility] passed on to Sheila[facility].  She is confirming with her transportation team, will call once confirmed.  CSW LM with [facility] to request SNF auth.   1500: Sheila/[facility] asking if pt can be TTS at SW, rather than MWF.  Olivia Mackie was able to get slot TTS 7am arrival for 715 chair time.  Freda Munro informed and can accept.  CSW provided facility name to [facility].    Expected Discharge Plan: [place] Barriers to Discharge: Continued Medical Work up, SNF Pending bed offer  Expected Discharge Plan and Services Expected Discharge Plan: [place] Choice: [place] arrangements for the past 2 months: Single Family Home Expected Discharge Date: 05/08/22                                     Social Determinants of Health (SDOH) Interventions Housing Interventions: Intervention Not Indicated Transportation Interventions: Intervention Not Indicated Utilities Interventions: Intervention Not Indicated  Readmission Risk Interventions    02/07/2022    2:57 PM 01/23/2022   12:38 PM 05/28/2020   12:50 PM  Readmission Risk Prevention Plan  Transportation Screening Complete Complete Complete  PCP or Specialist Appt within 3-5 Days  Complete   HRI or [place]  Complete   Social Work Consult for [place] Planning/Counseling  Complete   Palliative Care Screening  Not Applicable   Medication Review Press photographer) Complete Complete Complete  PCP or Specialist appointment within 3-5 days of discharge Complete  Complete  HRI or [place] Complete  Complete  SW Recovery Care/Counseling Consult Complete  Complete  Palliative Care Screening Not  Applicable  Not [place] Not Applicable  Not Applicable

## 2022-05-09 LAB — GLUCOSE, CAPILLARY
Glucose-Capillary: 310 mg/dL — ABNORMAL HIGH (ref 70–99)
Glucose-Capillary: 320 mg/dL — ABNORMAL HIGH (ref 70–99)
Glucose-Capillary: 240 mg/dL — ABNORMAL HIGH (ref 70–99)
Glucose-Capillary: 281 mg/dL — ABNORMAL HIGH (ref 70–99)
Glucose-Capillary: 289 mg/dL — ABNORMAL HIGH (ref 70–99)

## 2022-05-09 MED ORDER — SULFAMETHOXAZOLE-TRIMETHOPRIM 800-160 MG PO TABS: 1.0000 | ORAL_TABLET | Freq: Two times a day (BID) | ORAL | Status: DC

## 2022-05-09 MED ORDER — SULFAMETHOXAZOLE-TRIMETHOPRIM 800-160 MG PO TABS
2.0000 | ORAL_TABLET | Freq: Every day | ORAL | Status: DC
  Administered 2022-05-09 – 2022-05-11 (×2): 2 via ORAL
  Filled 2022-05-09 (×3): qty 2

## 2022-05-09 NOTE — Inpatient Diabetes Management (Signed)
Inpatient Diabetes Program Recommendations  AACE/ADA: New Consensus Statement on Inpatient Glycemic Control   Target Ranges:  Prepandial:   less than 140 mg/dL      Peak postprandial:   less than 180 mg/dL (1-2 hours)      Critically ill patients:  140 - 180 mg/dL    Latest Reference Range & Units 05/08/22 08:24 05/08/22 11:08 05/08/22 20:34 05/09/22 08:40  Glucose-Capillary 70 - 99 mg/dL 379 (H) 355 (H) 206 (H) 310 (H)   Review of Glycemic Control  Diabetes history: DM1 Outpatient Diabetes medications: OmniPod with U100 Regular insulin (total basal 25.9 units/day, Carb coverage 1:12 grams, Sensitivity factor 1:40 mg/dl  Current orders for Inpatient glycemic control: Semglee 28 units daily, Novolog 3 units TID with meals, Novolog 0-9 units TID with meals, Novolog 0-5 units QHS, Tradjenta 5 mg daily  Inpatient Diabetes Program Recommendations:    Insulin: Please consider increasing Semglee to 30 units daily and meal coverage to Novolog 5 units TID with meals.  Thanks, Barnie Alderman, RN, MSN, [place] Diabetes Coordinator Inpatient Diabetes Program [phone] (Team Pager from 8am to [place])

## 2022-05-09 NOTE — Progress Notes (Addendum)
  Progress Note    05/09/2022 1:42 PM 4 Days Post-Op  S/p I&D of infected left AV fistula 4 Days Post -Op CX grew serratia. She has been on Ceftazidime and Vanc We have been waiting on final culture sensitivities to determine antibiotics on D/c She will d/c on Bactrim for 10 days (total Antibiotic coverage of 14 days)  She is stable for discharge from vascular standpoint Outpatient follow up has been arranged  Karoline Caldwell, PA-C [facility] [phone] 05/09/2022 1:42 PM

## 2022-05-09 NOTE — Progress Notes (Signed)
Occupational Therapy Treatment Patient Details Name: Susan [name] MRN: [id number] DOB: [birth year] Today's Date: 05/09/2022   History of present illness Pt is a 71 y.o. female admitted 05/02/22 with R heel gangrene. 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 PMH includes recent admission 03/2022 with R heel drainage AKI; other PMH includes DM2, CHF, HTN, CAD, CKD, MI, BLE venous insufficiency, anxiety.   OT comments  Pt. Seen for skilled OT treatment session.  Pt. Initially hesitant but was agreeable and so excited with her progress.  Assisted with bed mobility in/out of bed with min/mod a.  Able to complete un supported sitting grooming task with no lob noted.  Some scooting while seated eob in preparation for back to bed.  Cont. With acute ot goals progressing adls and mobility as pt. Able.  Agree with current d/c recommendations.    Recommendations for follow up therapy are one component of a multi-disciplinary discharge planning process, led by the attending physician.  Recommendations may be updated based on patient status, additional functional criteria and insurance authorization.    Follow Up Recommendations  Skilled nursing-short term rehab (<3 hours/day)     Assistance Recommended at Discharge Frequent or constant Supervision/Assistance  Patient can return home with the following  Two people to help with walking and/or transfers;A lot of help with bathing/dressing/bathroom;Assistance with cooking/housework;Direct supervision/assist for medications management;Direct supervision/assist for financial management;Assist for transportation;Help with stairs or ramp for entrance   Equipment Recommendations       Recommendations for Other Services      Precautions / Restrictions Precautions Precautions: Fall Precaution Comments: R BKA wound vac; urine incontinence; decreased skin integrity; wears O2 at night Other Brace: R BKA limb protector Restrictions RLE Weight Bearing: Non weight bearing       Mobility  Bed Mobility Overal bed mobility: Needs Assistance Bed Mobility: Rolling, Sidelying to Sit, Sit to Sidelying Rolling: Min assist Sidelying to sit: Min assist, HOB elevated     Sit to sidelying: Mod assist General bed mobility comments: pt. able to utilize bed rail with b ues and in sidelying brought b les to edge and off of bed.  some support to guide trunk upright. able to scoot b hips towards eob and get LLE on the floor.  while seated able to scoot towards top of bed x4.  for back to bed sidelying with some assistance to bring LLE back into bed.  able to use b ues to pull on bed rails with LLE pushing and therapist asst. pulling on pads while bed in boost position to achieve pt. towards top of bed.    Transfers                         Balance                                           ADL either performed or assessed with clinical judgement   ADL Overall ADL's : Needs assistance/impaired     Grooming: Brushing hair;Sitting;Supervision/safety Grooming Details (indicate cue type and reason): un supported sitting eob able to engage bues for combing hair with no lob noted                               General ADL Comments: seated grooming task eob. also addressed bed mobility and scooting  in preparation for increasing functional mobilty    Extremity/Trunk Assessment              Vision       Perception     Praxis      Cognition Arousal/Alertness: Awake/alert Behavior During Therapy: Anxious, WFL for tasks assessed/performed Overall Cognitive Status: Within Functional Limits for tasks assessed                                          Exercises      Shoulder Instructions       General Comments      Pertinent Vitals/ Pain       Pain Assessment Pain Assessment: No/denies pain  Home Living                                          Prior Functioning/Environment               Frequency           Progress Toward Goals  OT Goals(current goals can now be found in the care plan section)  Progress towards OT goals: Progressing toward goals     Plan Discharge plan remains appropriate    Co-evaluation                 AM-PAC OT "6 Clicks" Daily Activity     Outcome Measure   Help from another person eating meals?: None Help from another person taking care of personal grooming?: A Little Help from another person toileting, which includes using toliet, bedpan, or urinal?: Total Help from another person bathing (including washing, rinsing, drying)?: Total Help from another person to put on and taking off regular upper body clothing?: A Lot Help from another person to put on and taking off regular lower body clothing?: Total 6 Click Score: 12    End of Session Equipment Utilized During Treatment: Oxygen  OT Visit Diagnosis: Muscle weakness (generalized) (M62.81);Pain;Other symptoms and signs involving cognitive function   Activity Tolerance Patient tolerated treatment well   Patient Left in bed;with call bell/phone within reach   Nurse Communication Other (comment) (alerted cna and rn that an o2 tank had apparantly been left during transport and was wedged under hob. unable to relieve and remove o2 tank. assisted cna and rn with transferring pt. to another hospital bed)        Time: 3419-3790 OT Time Calculation (min): 47 min  Charges: OT General Charges $OT Visit: 1 Visit OT Treatments $Self Care/Home Management : 38-52 mins  Sonia Baller, COTA/L Acute Rehabilitation [phone]   Clearnce Sorrel Lorraine-COTA/L 05/09/2022, 12:22 PM

## 2022-05-09 NOTE — Progress Notes (Signed)
Subjective: In room no cos, no shortness of breath noted had 3 L UF with HD yesterday  Objective Vital signs in last 24 hours: Vitals:   05/08/22 1811 05/08/22 1955 05/09/22 0440 05/09/22 0845  BP: (!) 103/56 (!) 125/48 (!) 102/51 (!) 128/49  Pulse: 81 84 87 90  Resp: '14 16 18 17  '$ Temp: 98.3 F (36.8 C) 97.6 F (36.4 C) 98.5 F (36.9 C) 97.6 F (36.4 C)  TempSrc: Oral Oral Oral Oral  SpO2: 100% 97% 94% 100%  Weight:      Height:       Weight change:   Physical Exam: General: Alert elderly female NAD Heart: RRR no MRG Lungs: CTA bilaterally nonlabored breathing Abdomen: NABS, soft, ND NT Extremities: Trace pedal edema L LE,   R BKA dressing dry /clean  Access: Right IJ TDC, left upper arm AVF dressing dry clean in place, bruit heard  Outpatient Dialysis Orders: TTS at [facility] - fairly new, just started HD 03/2022 4hr, 400/600, EDW 113.5kg, 2K/2Ca, TDC, heparin 2000 unit bolus - Mircera 155mg IV q 2 weeks (last given 11/30) - Venofer '100mg'$  x 10 ordered - Calcitriol 0.520m PO q HD   Problem/Plan:   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 DuArlyss GandyNF for rehab 2.  LUE AVF wound infection: Noted Serratia wound culture positive, [facility] following - s/p I&D.  On  ceftaz per pharm dosing.  3.  ESRD:  Continue HD on usual TTS schedule 4.  Hypertension/volume: BP stable, chronically overloaded, 3 L UF yesterday, progressively lowering EDW this admission 5.  Anemia: Hgb 8.2- continue aranesp 6.  Metabolic bone disease: Ca/Phos ok - no home binders, continue VDRA. 7.  Nutrition:  Alb low, 1.8, added supplements. 8.  T2DM: mgt per admitting team 9.  A-fib - eliquis on hold for surgery 12/11, now resumed   DaErnest HaberPA-C [id number]/15/2023,9:54 AM  LOS: 7 days   Labs: Basic Metabolic Panel: Recent Labs  Lab 05/03/22 0308 05/04/22 0322 05/05/22 1145 05/06/22 1110 05/08/22 0745  NA 136 135 126* 133* 131*  K 3.4* 4.0 4.1  4.3 4.1  CL 94* 93* 98 95* 92*  CO2 28 28  --  25 26  GLUCOSE 168* 99 261* 302* 318*  BUN 41* 33* 57* 77* 75*  CREATININE 3.62* 2.84* 4.10* 4.65* 4.68*  CALCIUM 8.5* 8.5*  --  8.7* 8.9  PHOS 3.6  --   --  5.0* 4.0   Liver Function Tests: Recent Labs  Lab 05/06/22 1110 05/08/22 0745  ALBUMIN 1.7* 1.8*   No results for input(s): "LIPASE", "AMYLASE" in the last 168 hours. No results for input(s): "AMMONIA" in the last 168 hours. CBC: Recent Labs  Lab 05/03/22 0308 05/04/22 0322 05/05/22 1145 05/06/22 1100 05/08/22 0339 05/08/22 0750  WBC 15.3* 13.6*  --  10.3 8.2 9.2  HGB 9.4* 9.6*   < > 7.9* 8.2* 8.2*  HCT 31.5* 32.1*   < > 25.8* 27.1* 28.0*  MCV 82.5 82.9  --  81.6 81.6 82.6  PLT 310 257  --  315 338 366   < > = values in this interval not displayed.   Cardiac Enzymes: No results for input(s): "CKTOTAL", "CKMB", "CKMBINDEX", "TROPONINI" in the last 168 hours. CBG: Recent Labs  Lab 05/07/22 2003 05/08/22 0824 05/08/22 1108 05/08/22 2034 05/09/22 0840  GLUCAP 181* 379* 355*  355* 206* 310*    Studies/Results: No results found. Medications:  cefTAZidime (FORTAZ)  IV 1 g (05/08/22 2109)   vancomycin Stopped (05/06/22 1412)    (feeding supplement) PROSource Plus  30 mL Oral BID BM   amiodarone  200 mg Oral Daily   apixaban  5 mg Oral BID   vitamin C  1,000 mg Oral Daily   aspirin EC  81 mg Oral Q breakfast   bacitracin   Topical Daily   Chlorhexidine Gluconate Cloth  6 each Topical Q0600   darbepoetin (ARANESP) injection - DIALYSIS  100 mcg Subcutaneous Q Thu-1800   docusate sodium  100 mg Oral Daily   Gerhardt's butt cream   Topical TID   insulin aspart  0-5 Units Subcutaneous QHS   insulin aspart  0-9 Units Subcutaneous TID WC   insulin aspart  3 Units Subcutaneous TID WC   insulin glargine-yfgn  28 Units Subcutaneous Daily   levothyroxine  50 mcg Oral Q0600   linagliptin  5 mg Oral Daily   nutrition supplement (JUVEN)  1 packet Oral BID BM    pantoprazole  40 mg Oral Daily   pregabalin  75 mg Oral Daily   sevelamer carbonate  1,600 mg Oral TID WC   vortioxetine HBr  20 mg Oral Daily   zinc sulfate  220 mg Oral Daily

## 2022-05-09 NOTE — TOC Progression Note (Signed)
Transition of Care [facility]) - Initial/Assessment Note    Patient Details  Name: Susan [name] MRN: [id number] Date of Birth: 17-Jan-1951  Transition of Care [facility]) CM/SW Contact:    Milinda Antis, [place] Phone Number: 05/09/2022, 8:24 AM  Clinical Narrative:                 LCSW contacted HTA to inquire about the status of insurance authorization.  The authorization is still pending.    TOC following.   Expected Discharge Plan: Skilled Nursing Facility Barriers to Discharge: Continued Medical Work up, SNF Pending bed offer   Patient Goals and CMS Choice Patient states their goals for this hospitalization and ongoing recovery are:: return home CMS Medicare.gov Compare Post Acute Care list provided to:: Patient Choice offered to / list presented to : Spouse, Patient  Expected Discharge Plan and Services Expected Discharge Plan: [place] Choice: [place] Living arrangements for the past 2 months: Single Family Home Expected Discharge Date: 05/08/22                                    Prior Living Arrangements/Services Living arrangements for the past 2 months: Single Family Home Lives with:: Spouse Patient language and need for interpreter reviewed:: Yes Do you feel safe going back to the place where you live?: Yes      Need for Family Participation in Patient Care: Yes (Comment) Care giver support system in place?: Yes (comment)   Criminal Activity/Legal Involvement Pertinent to Current Situation/Hospitalization: No - Comment as needed  Activities of Daily Living Home Assistive Devices/Equipment: Wheelchair, Environmental consultant (specify type), CBG Meter ADL Screening (condition at time of admission) Patient's cognitive ability adequate to safely complete daily activities?: Yes Is the patient deaf or have difficulty hearing?: No Does the patient have difficulty seeing, even when wearing glasses/contacts?: No Does the patient  have difficulty concentrating, remembering, or making decisions?: No Patient able to express need for assistance with ADLs?: Yes Does the patient have difficulty dressing or bathing?: Yes Independently performs ADLs?: Yes (appropriate for developmental age) Does the patient have difficulty walking or climbing stairs?: No Weakness of Legs: Both Weakness of Arms/Hands: Both  Permission Sought/Granted Permission sought to share information with : Facility Sport and exercise psychologist, Family Supports Permission granted to share information with : Yes, Verbal Permission Granted  Share Information with NAME: Percell Miller  Permission granted to share info w AGENCY: SNF  Permission granted to share info w Relationship: Spouse     Emotional Assessment Appearance:: Appears stated age Attitude/Demeanor/Rapport: Engaged Affect (typically observed): Accepting Orientation: : Oriented to Self, Oriented to Place, Oriented to  Time, Oriented to Situation      Admission diagnosis:  S/P BKA (below knee amputation) unilateral, right ([place]) [Z89.511] Patient Active Problem List   Diagnosis Date Noted   Gangrene of right foot ([place]) 05/02/2022   S/P BKA (below knee amputation) unilateral, right ([place]) 05/02/2022   Unspecified protein-calorie malnutrition ([place]) 04/15/2022   Secondary hyperparathyroidism of renal origin ([place]) 04/14/2022   Coagulation defect, unspecified ([place]) 04/09/2022   Acquired absence of other left toe(s) ([place]) 04/07/2022   Allergy, unspecified, initial encounter 04/07/2022   Dependence on renal dialysis ([place]) 04/07/2022   Gout due to renal impairment, unspecified site 04/07/2022   Hypertensive heart and chronic kidney disease with heart failure and with stage 5 chronic kidney disease, or end stage renal  disease ([place]) 04/07/2022   Personal history of transient ischemic attack (TIA), and cerebral infarction without residual deficits 04/07/2022   Renal osteodystrophy 04/07/2022   Venous stasis  ulcer of right calf ([place]) 03/31/2022   Fistula, colovaginal 03/26/2022   Diarrhea 03/26/2022   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   Sepsis without acute organ dysfunction ([place])    Bacteremia    Acute pancreatitis 02/01/2022   Abdominal pain 02/01/2022   SIRS (systemic inflammatory response syndrome) ([place]) 02/01/2022   Transaminitis 02/01/2022   History of anemia due to chronic kidney disease 02/01/2022   Paroxysmal atrial fibrillation ([place]) 02/01/2022   DKA (diabetic ketoacidosis) ([place]) 01/14/2022   NSTEMI (non-ST elevated myocardial infarction) ([place]) 03/05/2021   Acute renal failure superimposed on stage 4 chronic kidney disease ([place]) 08/22/2020   Hypoalbuminemia 05/25/2020   GERD (gastroesophageal reflux disease) 05/25/2020   Pressure injury of skin 05/17/2020   Type 2 diabetes mellitus with diabetic polyneuropathy, with long-term current use of insulin ([place]) 03/07/2020   Obesity, Class III, BMI 40-49.9 (morbid obesity) ([place]) 03/07/2020   Common bile duct (CBD) obstruction 05/28/2019   Benign neoplasm of ascending colon    Benign neoplasm of transverse colon    Benign neoplasm of descending colon    Benign neoplasm of sigmoid colon    Gastric polyps    Hyperkalemia 03/11/2019   Prolonged QT interval 03/11/2019   Onychomycosis 06/21/2018   Osteomyelitis of second toe of right foot ([place])    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 (HCC)    PVD (peripheral vascular disease) ([place]) 10/26/2017   E-coli UTI 07/27/2017   Hypothyroidism 07/27/2017   AKI (acute kidney injury) ([place])    PAH (pulmonary artery hypertension) ([place])    Impaired ambulation 07/19/2017   Nausea & vomiting 07/15/2017   Leg cramps 02/27/2017   Peripheral edema 01/12/2017   Diabetic neuropathy ([place]) 11/12/2016   CKD (chronic kidney disease), stage IV ([place]) 10/24/2015   Anemia 10/03/2015   Generalized anxiety disorder 10/03/2015   Insomnia 10/03/2015   Hyperglycemia due to diabetes mellitus  ([place]) 06/07/2015   Chronic diastolic CHF (congestive heart failure) ([place]) 06/07/2015   Non compliance with medical treatment 04/17/2014   Rotator cuff tear 03/14/2014   Class 3 obesity ([place]) 09/23/2013   Chronic HFrEF (heart failure with reduced ejection fraction) ([place]) 06/03/2013   Hypotension 12/25/2012   Urinary incontinence    MDD (major depressive disorder) 11/12/2010   RBBB (right bundle branch block)    Coronary artery disease    Hyperlipemia 01/22/2009   Essential hypertension 01/22/2009   PCP:  [name], MD Pharmacy:   [id number] [place] [facility] [address]Phone: [phone]: [phone] [facility] [address]Phone: [phone]: [phone]    Social Determinants of Health (SDOH) Interventions Housing Interventions: Intervention Not Indicated Transportation Interventions: Intervention Not Indicated Utilities Interventions: Intervention Not Indicated  Readmission Risk Interventions    02/07/2022    2:57 PM 01/23/2022   12:38 PM 05/28/2020   12:50 PM  Readmission Risk Prevention Plan  Transportation Screening Complete Complete Complete  PCP or Specialist Appt within 3-5 Days  Complete   HRI or [place] Complete   Social Work Consult for RAdamsPlanning/Counseling  Complete   Palliative Care Screening  Not Applicable   Medication Review (Press photographer Complete Complete Complete  PCP or Specialist appointment within 3-5 days of discharge Complete  Complete  HRI or [place] Complete  Complete  SW Recovery Care/Counseling Consult Complete  Complete  Palliative Care Screening Not Applicable  Not [place] Not Applicable  Not Applicable

## 2022-05-09 NOTE — Progress Notes (Signed)
Physical Therapy Treatment Patient Details Name: Susan [name] MRN: [id number] DOB: June 17, 1950 Today's Date: 05/09/2022   History of Present Illness Pt is a 71 y.o. female admitted 05/02/22 with R heel gangrene. 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 PMH includes recent admission 03/2022 with R heel drainage AKI; other PMH includes DM2, CHF, HTN, CAD, CKD, MI, BLE venous insufficiency, anxiety.    PT Comments    Pt was seen for progression to side of bed with practice scooting on side of bed with only verbal cues from PT.  Pt is demonstrating better awareness of her sitting balance, her place on bed with security of balance and is demonstrating the ability to get to a chair now.  Pt is much more in the position to make progress with rehab stay and will continue to recommend SNF care with focus on strength, balance and control of her mobility.   Recommendations for follow up therapy are one component of a multi-disciplinary discharge planning process, led by the attending physician.  Recommendations may be updated based on patient status, additional functional criteria and insurance authorization.  Follow Up Recommendations  Skilled nursing-short term rehab (<3 hours/day) Can patient physically be transported by private vehicle: No   Assistance Recommended at Discharge Intermittent Supervision/Assistance  Patient can return home with the following Two people to help with walking and/or transfers;A lot of help with bathing/dressing/bathroom;Assistance with cooking/housework;Direct supervision/assist for medications management;Direct supervision/assist for financial management;Assist for transportation;Help with stairs or ramp for entrance   Equipment Recommendations  Other (comment) (bariatric bed, bari wheelchair, hoyer lift)    Recommendations for Other Services       Precautions / Restrictions Precautions Precautions: Fall Precaution Comments: R BKA wound vac; urine incontinence; decreased skin integrity;  wears O2 at night Required Braces or Orthoses: Other Brace Other Brace: R BKA limb protector Restrictions Weight Bearing Restrictions: Yes RLE Weight Bearing: Non weight bearing     Mobility  Bed Mobility Overal bed mobility: Needs Assistance Bed Mobility: Supine to Sit, Sit to Supine Rolling: Min assist   Supine to sit: Min assist Sit to supine: Mod assist   General bed mobility comments: pt is protecting L elbow incision and has limited use of UE's given rotator cuff injuries and surgery.  Contacted MD about the skin injury on R leg    Transfers Overall transfer level: Needs assistance Equipment used: None Transfers: Bed to chair/wheelchair/BSC            Lateral/Scoot Transfers: Min assist General transfer comment: min assist on side of bed with cues for safety    Ambulation/Gait               General Gait Details: unable   Stairs             Wheelchair Mobility    Modified Rankin (Stroke Patients Only)       Balance Overall balance assessment: Needs assistance Sitting-balance support: Bilateral upper extremity supported Sitting balance-Leahy Scale: Fair                                      Cognition Arousal/Alertness: Awake/alert Behavior During Therapy: WFL for tasks assessed/performed Overall Cognitive Status: Within Functional Limits for tasks assessed  Exercises      General Comments General comments (skin integrity, edema, etc.): Pt is having issues with skin on new amputation with breakdown at the top of the shrinker, and has wound issues on L elbow at surgical incsion      Pertinent Vitals/Pain Pain Assessment Pain Assessment: Faces Faces Pain Scale: Hurts little more Pain Location: R residual limb Pain Descriptors / Indicators: Guarding Pain Intervention(s): Limited activity within patient's tolerance, Monitored during session, Premedicated before  session, Repositioned    Home Living                          Prior Function            PT Goals (current goals can now be found in the care plan section) Acute Rehab PT Goals Patient Stated Goal: manage pain Progress towards PT goals: Progressing toward goals    Frequency    Min 2X/week      PT Plan Current plan remains appropriate    Co-evaluation              AM-PAC PT "6 Clicks" Mobility   Outcome Measure  Help needed turning from your back to your side while in a flat bed without using bedrails?: A Lot Help needed moving from lying on your back to sitting on the side of a flat bed without using bedrails?: A Lot Help needed moving to and from a bed to a chair (including a wheelchair)?: Total Help needed standing up from a chair using your arms (e.g., wheelchair or bedside chair)?: Total Help needed to walk in hospital room?: Total Help needed climbing 3-5 steps with a railing? : Total 6 Click Score: 8    End of Session   Activity Tolerance: Patient tolerated treatment well Patient left: in bed;with call bell/phone within reach Nurse Communication: Mobility status PT Visit Diagnosis: Other abnormalities of gait and mobility (R26.89);Muscle weakness (generalized) (M62.81);Pain Pain - Right/Left: Right Pain - part of body: Leg     Time: 6734-1937 PT Time Calculation (min) (ACUTE ONLY): 22 min  Charges:  $Therapeutic Activity: 8-22 mins     Ramond Dial 05/09/2022, 12:33 PM  Mee Hives, PT PhD Acute Rehab Dept. Number: [place] and [place]

## 2022-05-10 LAB — AEROBIC/ANAEROBIC CULTURE W GRAM STAIN (SURGICAL/DEEP WOUND)

## 2022-05-10 LAB — GLUCOSE, CAPILLARY
Glucose-Capillary: 337 mg/dL — ABNORMAL HIGH (ref 70–99)
Glucose-Capillary: 334 mg/dL — ABNORMAL HIGH (ref 70–99)
Glucose-Capillary: 240 mg/dL — ABNORMAL HIGH (ref 70–99)

## 2022-05-10 MED ORDER — HEPARIN SODIUM (PORCINE) 1000 UNIT/ML IJ SOLN: 2000.0000 [IU] | Freq: Once | INTRAMUSCULAR | Status: AC

## 2022-05-10 MED ORDER — HEPARIN SODIUM (PORCINE) 1000 UNIT/ML IJ SOLN
INTRAMUSCULAR | Status: AC
  Administered 2022-05-10: 3800 [IU] via INTRAVENOUS
  Filled 2022-05-10: qty 4

## 2022-05-10 MED ORDER — PROCHLORPERAZINE MALEATE 5 MG PO TABS: 5.0000 mg | ORAL_TABLET | Freq: Three times a day (TID) | ORAL | Status: DC | PRN

## 2022-05-10 MED ORDER — HEPARIN SODIUM (PORCINE) 1000 UNIT/ML IJ SOLN
INTRAMUSCULAR | Status: AC
  Administered 2022-05-10: 2000 [IU]
  Filled 2022-05-10: qty 2

## 2022-05-10 NOTE — Inpatient Diabetes Management (Signed)
Inpatient Diabetes Program Recommendations  AACE/ADA: New Consensus Statement on Inpatient Glycemic Control   Target Ranges:  Prepandial:   less than 140 mg/dL      Peak postprandial:   less than 180 mg/dL (1-2 hours)      Critically ill patients:  140 - 180 mg/dL   Review of Glycemic Control  Latest Reference Range & Units 05/09/22 12:33 05/09/22 16:06 05/09/22 19:39 05/09/22 22:03 05/10/22 07:51 05/10/22 12:05  Glucose-Capillary 70 - 99 mg/dL 320 (H) 240 (H) 281 (H) 289 (H) 337 (H) 334 (H)   Diabetes history: DM1 Outpatient Diabetes medications: OmniPod with U100 Regular insulin (total basal 25.9 units/day, Carb coverage 1:12 grams, Sensitivity factor 1:40 mg/dl  Current orders for Inpatient glycemic control:  Semglee 28 units daily Novolog 3 units TID with meals Novolog 0-9 units TID with meals Novolog 0-5 units QHS Tradjenta 5 mg daily  Inpatient Diabetes Program Recommendations:    -   Increase Semglee to 30 units daily and meal coverage to Novolog 5 units TID with meals.  Thanks, Barnie Alderman, RN, MSN, CDCES Tama Headings RN, MSN, BC-ADM Inpatient Diabetes Coordinator Team Pager [phone] (8a-5p)

## 2022-05-10 NOTE — TOC Progression Note (Signed)
Transition of Care [facility]) - Progression Note    Patient Details  Name: Susan [name] MRN: [id number] Date of Birth: [birth year]  [facility] CM/SW Contact  Emeterio Reeve, [place] Phone Number: 05/10/2022, 11:24 AM  Clinical Narrative:     CSW spoke to HTA, pts Josem Kaufmann is still pending. LCSW will update when Josem Kaufmann is approved. Facility confirmed they can accept pt when approved.   Expected Discharge Plan: [place] Barriers to Discharge: Continued Medical Work up, SNF Pending bed offer  Expected Discharge Plan and Services Expected Discharge Plan: [place] Choice: [place] arrangements for the past 2 months: Single Family Home Expected Discharge Date: 05/08/22                                     Social Determinants of Health (SDOH) Interventions Housing Interventions: Intervention Not Indicated Transportation Interventions: Intervention Not Indicated Utilities Interventions: Intervention Not Indicated  Readmission Risk Interventions    02/07/2022    2:57 PM 01/23/2022   12:38 PM 05/28/2020   12:50 PM  Readmission Risk Prevention Plan  Transportation Screening Complete Complete Complete  PCP or Specialist Appt within 3-5 Days  Complete   HRI or [place]  Complete   Social Work Consult for [place] Planning/Counseling  Complete   Palliative Care Screening  Not Applicable   Medication Review Press photographer) Complete Complete Complete  PCP or Specialist appointment within 3-5 days of discharge Complete  Complete  HRI or [place] Complete  Complete  SW Recovery Care/Counseling Consult Complete  Complete  Palliative Care Screening Not Applicable  Not [place] Not Applicable  Not Applicable

## 2022-05-10 NOTE — Progress Notes (Signed)
Subjective: No current complaints, for dialysis today noted for discharge to nursing home for rehab  Objective Vital signs in last 24 hours: Vitals:   05/09/22 0845 05/09/22 1433 05/09/22 2155 05/10/22 0752  BP: (!) 128/49 (!) 115/50 (!) 92/53 (!) 103/49  Pulse: 90 85 81 80  Resp: '17 18 20 16  '$ Temp: 97.6 F (36.4 C) 98.8 F (37.1 C) 97.6 F (36.4 C) 98 F (36.7 C)  TempSrc: Oral Oral Oral Oral  SpO2: 100% 100% 100% 100%  Weight:      Height:       Weight change:   Physical Exam: General: Alert elderly female NAD Heart: RRR no MRG Lungs: CTA bilaterally nonlabored breathing Abdomen: NABS, soft, ND NT Extremities: Trace pedal edema L LE,   R BKA dressing dry /clean  Access: Right IJ TDC, left upper arm AVF dressing dry clean in place, bruit heard   Outpatient Dialysis Orders: TTS at [facility] - fairly new, just started HD 03/2022 4hr, 400/600, EDW 113.5kg, 2K/2Ca, TDC, heparin 2000 unit bolus - Mircera 125mg IV q 2 weeks (last given 11/30) - Venofer '100mg'$  x 10 ordered - Calcitriol 0.520m PO q HD   Problem/Plan:    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 DuArlyss GandyNF for rehab 2.  LUE AVF wound infection sp I&D: Noted Serratia wound culture positive, VVS following - s/p I&D.  Was on ceftaz per pharm dosing.  To be discharged home Bactrim for 10 days total/total antibiotic coverage 14 days per VVS 3.  ESRD:  Continue HD on usual TTS schedule 4.  Hypertension/volume: BP stable, chronically overloaded, 3 L UF Thursday HD 12/14 ,progressively UF as tolerated  5.  Anemia: Hgb 8.2- continue aranesp 6.  Metabolic bone disease: Ca/Phos ok - no home binders, continue VDRA. 7.  Nutrition:  Alb low, 1.8, added supplements. 8.  T2DM: mgt per admitting team 9.  A-fib - eliquis on hold for surgery , now resumed  DaErnest HaberPA-C [phone]/16/2023,11:58 AM  LOS: 8 days   Labs: Basic Metabolic Panel: Recent Labs  Lab 05/04/22 0322  05/05/22 1145 05/06/22 1110 05/08/22 0745  NA 135 126* 133* 131*  K 4.0 4.1 4.3 4.1  CL 93* 98 95* 92*  CO2 28  --  25 26  GLUCOSE 99 261* 302* 318*  BUN 33* 57* 77* 75*  CREATININE 2.84* 4.10* 4.65* 4.68*  CALCIUM 8.5*  --  8.7* 8.9  PHOS  --   --  5.0* 4.0   Liver Function Tests: Recent Labs  Lab 05/06/22 1110 05/08/22 0745  ALBUMIN 1.7* 1.8*   No results for input(s): "LIPASE", "AMYLASE" in the last 168 hours. No results for input(s): "AMMONIA" in the last 168 hours. CBC: Recent Labs  Lab 05/04/22 0322 05/05/22 1145 05/06/22 1100 05/08/22 0339 05/08/22 0750  WBC 13.6*  --  10.3 8.2 9.2  HGB 9.6*   < > 7.9* 8.2* 8.2*  HCT 32.1*   < > 25.8* 27.1* 28.0*  MCV 82.9  --  81.6 81.6 82.6  PLT 257  --  315 338 366   < > = values in this interval not displayed.   Cardiac Enzymes: No results for input(s): "CKTOTAL", "CKMB", "CKMBINDEX", "TROPONINI" in the last 168 hours. CBG: Recent Labs  Lab 05/09/22 1233 05/09/22 1606 05/09/22 1939 05/09/22 2203 05/10/22 0751  GLUCAP 320* 240* 281* 289* 337*    Studies/Results: No results found. Medications:   (feeding supplement) PROSource Plus  30 mL Oral BID BM   amiodarone  200 mg Oral Daily   apixaban  5 mg Oral BID   vitamin C  1,000 mg Oral Daily   aspirin EC  81 mg Oral Q breakfast   Chlorhexidine Gluconate Cloth  6 each Topical Q0600   darbepoetin (ARANESP) injection - DIALYSIS  100 mcg Subcutaneous Q Thu-1800   docusate sodium  100 mg Oral Daily   Gerhardt's butt cream   Topical TID   insulin aspart  0-5 Units Subcutaneous QHS   insulin aspart  0-9 Units Subcutaneous TID WC   insulin aspart  3 Units Subcutaneous TID WC   insulin glargine-yfgn  28 Units Subcutaneous Daily   levothyroxine  50 mcg Oral Q0600   linagliptin  5 mg Oral Daily   nutrition supplement (JUVEN)  1 packet Oral BID BM   pantoprazole  40 mg Oral Daily   pregabalin  75 mg Oral Daily   sevelamer carbonate  1,600 mg Oral TID WC    sulfamethoxazole-trimethoprim  2 tablet Oral Daily   vortioxetine HBr  20 mg Oral Daily   zinc sulfate  220 mg Oral Daily

## 2022-05-10 NOTE — Plan of Care (Signed)
  Problem: Education: Goal: Ability to describe self-care measures that may prevent or decrease complications (Diabetes Survival Skills Education) will improve Outcome: Progressing   Problem: Coping: Goal: Ability to adjust to condition or change in health will improve Outcome: Progressing   Problem: Metabolic: Goal: Ability to maintain appropriate glucose levels will improve Outcome: Progressing   Problem: Pain Management: Goal: Pain level will decrease with appropriate interventions Outcome: Progressing

## 2022-05-10 NOTE — Progress Notes (Signed)
Morning Medications held.  Patient due to go to dialysis

## 2022-05-10 NOTE — Progress Notes (Signed)
Patient ID: Susan [name], female   DOB: July 28, 1950, 71 y.o.   MRN: [id number] Patient has no complaints this morning the wound VAC is functioning well.  Plan for discharge to skilled nursing with dialysis.  Patient to be discharged once insurance approves the skilled nursing discharge.

## 2022-05-10 NOTE — Progress Notes (Signed)
Patient declined wound care on her left lower extremity. Per patient, she would like to see Dr. Sharol Given prior to dressing change.

## 2022-05-10 NOTE — Procedures (Signed)
HD Note:  Some information was entered later than the data was gathered due to patient care needs. The stated time with the data is accurate.  Received patient in bed to unit.  Alert and oriented.  Informed consent signed and in chart.    Patient tolerated well until the last 7 minutes. She became sick to her stomach and UF was turned off.  Transported back to the room  Alert, without acute distress.  Hand-off given to patient's nurse.   Access used: Right Chest HD Catheter Access issues: None  Total UF removed: [facility]

## 2022-05-11 LAB — GLUCOSE, CAPILLARY
Glucose-Capillary: 291 mg/dL — ABNORMAL HIGH (ref 70–99)
Glucose-Capillary: 172 mg/dL — ABNORMAL HIGH (ref 70–99)

## 2022-05-11 LAB — RENAL FUNCTION PANEL
Albumin: 1.9 g/dL — ABNORMAL LOW (ref 3.5–5.0)
Anion gap: 11 (ref 5–15)
BUN: 48 mg/dL — ABNORMAL HIGH (ref 8–23)
CO2: 26 mmol/L (ref 22–32)
Calcium: 8.8 mg/dL — ABNORMAL LOW (ref 8.9–10.3)
Chloride: 96 mmol/L — ABNORMAL LOW (ref 98–111)
Creatinine, Ser: 3.32 mg/dL — ABNORMAL HIGH (ref 0.44–1.00)
GFR, Estimated: 14 mL/min — ABNORMAL LOW (ref >60)
Glucose, Bld: 221 mg/dL — ABNORMAL HIGH (ref 70–99)
Phosphorus: 2.2 mg/dL — ABNORMAL LOW (ref 2.5–4.6)
Potassium: 3.2 mmol/L — ABNORMAL LOW (ref 3.5–5.1)
Sodium: 133 mmol/L — ABNORMAL LOW (ref 135–145)

## 2022-05-11 LAB — CBC
HCT: 28.1 % — ABNORMAL LOW (ref 36.0–46.0)
Hemoglobin: 8.3 g/dL — ABNORMAL LOW (ref 12.0–15.0)
MCH: 24.1 pg — ABNORMAL LOW (ref 26.0–34.0)
MCHC: 29.5 g/dL — ABNORMAL LOW (ref 30.0–36.0)
MCV: 81.4 fL (ref 80.0–100.0)
Platelets: 409 10*3/uL — ABNORMAL HIGH (ref 150–400)
RBC: 3.45 MIL/uL — ABNORMAL LOW (ref 3.87–5.11)
RDW: 19.4 % — ABNORMAL HIGH (ref 11.5–15.5)
WBC: 8.9 10*3/uL (ref 4.0–10.5)
nRBC: 0 % (ref 0.0–0.2)

## 2022-05-11 MED ORDER — SULFAMETHOXAZOLE-TRIMETHOPRIM 800-160 MG PO TABS
1.0000 | ORAL_TABLET | Freq: Every day | ORAL | Status: DC
  Administered 2022-05-12: 1 via ORAL
  Filled 2022-05-11: qty 1

## 2022-05-11 NOTE — Inpatient Diabetes Management (Signed)
Inpatient Diabetes Program Recommendations  AACE/ADA: New Consensus Statement on Inpatient Glycemic Control   Target Ranges:  Prepandial:   less than 140 mg/dL      Peak postprandial:   less than 180 mg/dL (1-2 hours)      Critically ill patients:  140 - 180 mg/dL   Review of Glycemic Control  Latest Reference Range & Units 05/09/22 12:33 05/09/22 16:06 05/09/22 19:39 05/09/22 22:03 05/10/22 07:51 05/10/22 12:05  Glucose-Capillary 70 - 99 mg/dL 320 (H) 240 (H) 281 (H) 289 (H) 337 (H) 334 (H)    Latest Reference Range & Units 05/10/22 07:51 05/10/22 12:05 05/10/22 21:04 05/11/22 08:16  Glucose-Capillary 70 - 99 mg/dL 337 (H) 334 (H) 240 (H) 291 (H)   Diabetes history: DM1 Outpatient Diabetes medications: OmniPod with U100 Regular insulin (total basal 25.9 units/day, Carb coverage 1:12 grams, Sensitivity factor 1:40 mg/dl  Current orders for Inpatient glycemic control:  Semglee 28 units daily Novolog 3 units TID with meals Novolog 0-9 units TID with meals Novolog 0-5 units QHS Tradjenta 5 mg daily  Inpatient Diabetes Program Recommendations:    -   Increase Semglee to 30 units daily and meal coverage to Novolog 5 units TID with meals.  Thanks,  Tama Headings RN, MSN, BC-ADM Inpatient Diabetes Coordinator Team Pager [phone] (8a-5p)

## 2022-05-11 NOTE — TOC Progression Note (Addendum)
Transition of Care [facility]) - Progression Note    Patient Details  Name: Susan [name] MRN: [id number] Date of Birth: 24-Aug-1950  Transition of Care [facility]) CM/SW Contact  [address], [place], [place] Phone Number: 05/11/2022, 10:43 AM  Clinical Narrative:    Phone call to [employer]-authorization received-patient approved for 10 days- authorization [phone]. Phone call to [facility], left message with admissions-[name] to coordinate discharge.   [address], LCSW Transition of Care [phone]    Expected Discharge Plan: [place] Barriers to Discharge: Continued Medical Work up, SNF Pending bed offer  Expected Discharge Plan and Services Expected Discharge Plan: [place] Choice: [place] arrangements for the past 2 months: Single Family Home Expected Discharge Date: 05/08/22                                     Social Determinants of Health (SDOH) Interventions Housing Interventions: Intervention Not Indicated Transportation Interventions: Intervention Not Indicated Utilities Interventions: Intervention Not Indicated  Readmission Risk Interventions    02/07/2022    2:57 PM 01/23/2022   12:38 PM 05/28/2020   12:50 PM  Readmission Risk Prevention Plan  Transportation Screening Complete Complete Complete  PCP or Specialist Appt within 3-5 Days  Complete   HRI or [place]  Complete   Social Work Consult for [place] Planning/Counseling  Complete   Palliative Care Screening  Not Applicable   Medication Review Press photographer) Complete Complete Complete  PCP or Specialist appointment within 3-5 days of discharge Complete  Complete  HRI or [place] Complete  Complete  SW Recovery Care/Counseling Consult Complete  Complete  Palliative Care Screening Not Applicable  Not [place] Not Applicable  Not Applicable

## 2022-05-11 NOTE — TOC Progression Note (Signed)
Transition of Care [facility]) - Progression Note    Patient Details  Name: Susan [name] MRN: [id number] Date of Birth: 08-04-1950  Transition of Care [facility]) CM/SW Contact  [name], [name], [name] Phone Number: 05/11/2022, 4:10 PM  Clinical Narrative:     VM left with [name] from [facility] to coordinate discharge to SNF.Marland Kitchen  [name], LCSW Transition of Care   Expected Discharge Plan: Skilled Nursing Facility Barriers to Discharge: Continued Medical Work up, SNF Pending bed offer  Expected Discharge Plan and Services Expected Discharge Plan: [name] Choice: [place] Living arrangements for the past 2 months: Single Family Home Expected Discharge Date: 05/08/22                                     Social Determinants of Health (SDOH) Interventions Housing Interventions: Intervention Not Indicated Transportation Interventions: Intervention Not Indicated Utilities Interventions: Intervention Not Indicated  Readmission Risk Interventions    02/07/2022    2:57 PM 01/23/2022   12:38 PM 05/28/2020   12:50 PM  Readmission Risk Prevention Plan  Transportation Screening Complete Complete Complete  PCP or Specialist Appt within 3-5 Days  Complete   HRI or [place]  Complete   Social Work Consult for [place] Planning/Counseling  Complete   Palliative Care Screening  Not Applicable   Medication Review Press photographer) Complete Complete Complete  PCP or Specialist appointment within 3-5 days of discharge Complete  Complete  HRI or [place] Complete  Complete  SW Recovery Care/Counseling Consult Complete  Complete  Palliative Care Screening Not Applicable  Not [place] Not Applicable  Not Applicable

## 2022-05-11 NOTE — Progress Notes (Addendum)
Subjective: In room.  Had 2.9 L UF yesterday on dialysis.  Patient states when she got back to room 8:30 PM last night she became nauseous. She tells me she has little out urine output.  Currently no shortness of breath /chest pain /nausea .  Objective Vital signs in last 24 hours: Vitals:   05/10/22 1847 05/10/22 1858 05/10/22 2116 05/11/22 0355  BP: 108/65 108/65 (!) 120/32 (!) 122/40  Pulse: 83 85 95 82  Resp: 16 (!) '9 14 18  '$ Temp:   97.7 F (36.5 C) 98.1 F (36.7 C)  TempSrc:   Oral Oral  SpO2: 99% 100% 100% 100%  Weight:      Height:       Weight change:    Physical Exam: General: Alert elderly female NAD Heart: RRR no MRG Lungs: CTA bilaterally nonlabored breathing Abdomen: NABS, soft, ND NT Extremities: Trace pedal edema L LE,   R BKA dressing dry /clean /left lower extremity dressing dry clean Access: Right IJ TDC, left upper arm AVF dressing dry clean in place, bruit heard   Outpatient Dialysis Orders: TTS at [facility] - fairly new, just started HD 03/2022   noted she will have new Kid center  when transferred to rehab SW TTS 4hr, 400/600, EDW 113.5kg, 2K/2Ca, TDC, heparin 2000 unit bolus - Mircera 178mg IV q 2 weeks (last given 11/30) - Venofer '100mg'$  x 10 ordered - Calcitriol 0.545m PO q HD   Problem/Plan:    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 DuArlyss GandyNF for rehab 2.  LUE AVF wound infection sp I&D: Noted Serratia wound culture positive, VVS following - s/p I&D.  Was on ceftaz per pharm dosing.  To be discharged home Bactrim for 10 days total/total antibiotic coverage 14 days per VVS , and renal dosed Bactrim  DS 1qd 3.  ESRD:  Continue HD on usual TTS schedule /noted her new kidney center when transferred to rehab will  be SW  center with HD TTS.("[place] ) she reports little urine output with GFR 10 so we will continue current HD schedule also with history of being chronically volume overloaded., Use all renal dosed meds  ,  4.  Hypertension/volume: BP stable, chronically overloaded, 3 L UF Thursday HD 12/14 , 12/16  UF 2.9 some drop in BP at end may be getting close to EDW now. 5.  Anemia: Hgb 8.3- continue aranesp 6.  Metabolic bone disease: Corrected calcium now 10.5 will hold VDRA, Phos 2.2.  DC Renvela phosphate binder for now, follow-up lab trend 7.  Nutrition:  Alb low, 1.8, added supplements. 8.  T2DM: mgt per admitting team 9.  A-fib - eliquis on hold for surgery , now resumed   DaErnest HaberPA-C [facility] Bee[place] [phone]/17/2023,12:21 PM  LOS: 9 days   Labs: Basic Metabolic Panel: Recent Labs  Lab 05/06/22 1110 05/08/22 0745 05/11/22 0256  NA 133* 131* 133*  K 4.3 4.1 3.2*  CL 95* 92* 96*  CO2 '25 26 26  '$ GLUCOSE 302* 318* 221*  BUN 77* 75* 48*  CREATININE 4.65* 4.68* 3.32*  CALCIUM 8.7* 8.9 8.8*  PHOS 5.0* 4.0 2.2*   Liver Function Tests: Recent Labs  Lab 05/06/22 1110 05/08/22 0745 05/11/22 0256  ALBUMIN 1.7* 1.8* 1.9*   No results for input(s): "LIPASE", "AMYLASE" in the last 168 hours. No results for input(s): "AMMONIA" in the last 168 hours. CBC: Recent Labs  Lab 05/06/22 1100 05/08/22 0339  05/08/22 0750 05/11/22 0256  WBC 10.3 8.2 9.2 8.9  HGB 7.9* 8.2* 8.2* 8.3*  HCT 25.8* 27.1* 28.0* 28.1*  MCV 81.6 81.6 82.6 81.4  PLT 315 338 366 409*   Cardiac Enzymes: No results for input(s): "CKTOTAL", "CKMB", "CKMBINDEX", "TROPONINI" in the last 168 hours. CBG: Recent Labs  Lab 05/09/22 2203 05/10/22 0751 05/10/22 1205 05/10/22 2104 05/11/22 0816  GLUCAP 289* 337* 334* 240* 291*    Studies/Results: No results found. Medications:   (feeding supplement) PROSource Plus  30 mL Oral BID BM   amiodarone  200 mg Oral Daily   apixaban  5 mg Oral BID   vitamin C  1,000 mg Oral Daily   aspirin EC  81 mg Oral Q breakfast   Chlorhexidine Gluconate Cloth  6 each Topical Q0600   darbepoetin (ARANESP) injection - DIALYSIS  100 mcg Subcutaneous Q  Thu-1800   docusate sodium  100 mg Oral Daily   Gerhardt's butt cream   Topical TID   insulin aspart  0-5 Units Subcutaneous QHS   insulin aspart  0-9 Units Subcutaneous TID WC   insulin aspart  3 Units Subcutaneous TID WC   insulin glargine-yfgn  28 Units Subcutaneous Daily   levothyroxine  50 mcg Oral Q0600   linagliptin  5 mg Oral Daily   nutrition supplement (JUVEN)  1 packet Oral BID BM   pantoprazole  40 mg Oral Daily   pregabalin  75 mg Oral Daily   sevelamer carbonate  1,600 mg Oral TID WC   sulfamethoxazole-trimethoprim  2 tablet Oral Daily   vortioxetine HBr  20 mg Oral Daily   zinc sulfate  220 mg Oral Daily

## 2022-05-11 NOTE — Progress Notes (Signed)
  Subjective: Patient stable.  Right BKA remains painful as expected   Objective: Vital signs in last 24 hours: Temp:  [97.7 F (36.5 C)-98.1 F (36.7 C)] 98.1 F (36.7 C) (12/17 0355) Pulse Rate:  [73-95] 82 (12/17 0355) Resp:  [8-18] 18 (12/17 0355) BP: (93-135)/(31-68) 122/40 (12/17 0355) SpO2:  [94 %-100 %] 100 % (12/17 0355)  Intake/Output from previous day: 12/16 0701 - 12/17 0700 In: 600 [P.O.:600] Out: 2903 [Urine:1; Stool:2] Intake/Output this shift: No intake/output data recorded.  Exam:  Dressing dry.  Nontender to palpation  Labs: Recent Labs    05/11/22 0256  HGB 8.3*   Recent Labs    05/11/22 0256  WBC 8.9  RBC 3.45*  HCT 28.1*  PLT 409*   Recent Labs    05/11/22 0256  NA 133*  K 3.2*  CL 96*  CO2 26  BUN 48*  CREATININE 3.32*  GLUCOSE 221*  CALCIUM 8.8*   No results for input(s): "LABPT", "INR" in the last 72 hours.  Assessment/Plan: Impression is multiple days status post right BKA.  Medical problems being managed by the medical team.  Continue to mobilize with therapy for transfers.  Anticipate further medical management prior to discharge   G Alphonzo Severance 05/11/2022, 9:08 AM

## 2022-05-11 NOTE — Plan of Care (Signed)
  Problem: Coping: Goal: Ability to adjust to condition or change in health will improve Outcome: Progressing   Problem: Fluid Volume: Goal: Ability to maintain a balanced intake and output will improve Outcome: Progressing   Problem: Tissue Perfusion: Goal: Adequacy of tissue perfusion will improve Outcome: Progressing   Problem: Education: Goal: Knowledge of the prescribed therapeutic regimen will improve Outcome: Progressing   Problem: Pain Management: Goal: Pain level will decrease with appropriate interventions Outcome: Progressing

## 2022-05-12 LAB — GLUCOSE, CAPILLARY
Glucose-Capillary: 272 mg/dL — ABNORMAL HIGH (ref 70–99)
Glucose-Capillary: 242 mg/dL — ABNORMAL HIGH (ref 70–99)
Glucose-Capillary: 245 mg/dL — ABNORMAL HIGH (ref 70–99)
Glucose-Capillary: 175 mg/dL — ABNORMAL HIGH (ref 70–99)
Glucose-Capillary: 168 mg/dL — ABNORMAL HIGH (ref 70–99)

## 2022-05-12 MED ORDER — CHLORHEXIDINE GLUCONATE CLOTH 2 % EX PADS
6.0000 | MEDICATED_PAD | Freq: Every day | CUTANEOUS | Status: DC
  Administered 2022-05-12: 6 via TOPICAL

## 2022-05-12 NOTE — Plan of Care (Signed)
  Problem: Coping: Goal: Ability to adjust to condition or change in health will improve Outcome: Progressing   Problem: Fluid Volume: Goal: Ability to maintain a balanced intake and output will improve Outcome: Progressing   Problem: Tissue Perfusion: Goal: Adequacy of tissue perfusion will improve Outcome: Progressing   Problem: Education: Goal: Knowledge of the prescribed therapeutic regimen will improve Outcome: Progressing Goal: Ability to verbalize activity precautions or restrictions will improve Outcome: Progressing Goal: Understanding of discharge needs will improve Outcome: Progressing   Problem: Activity: Goal: Ability to perform//tolerate increased activity and mobilize with assistive devices will improve Outcome: Progressing

## 2022-05-12 NOTE — Progress Notes (Signed)
Report called to [place], LPN at [facility].  Patient is stable.  Family at bedside.  Will continue to monitor.

## 2022-05-12 NOTE — Progress Notes (Signed)
Pt has a redness on her right wrist which appears to be blood leaking under the skin. She stated the area felt itchy and she scratched it. This caused a small tear and a little blood to come out. No active bleeding and patient is alert and oriented. Notified on-call NP

## 2022-05-12 NOTE — Progress Notes (Signed)
[name] Progress Note   Subjective:   Seen in room, reports she still feels mildly SOB but stable. No CP, dizziness, headache or nausea. Having some pain at AVF incision site today but no drainage.   Objective Vitals:   05/11/22 0355 05/11/22 1746 05/11/22 2006 05/12/22 0801  BP: (!) 122/40 (!) 145/55 (!) 124/47 (!) 116/28  Pulse: 82 90 87 84  Resp: '18 18 17   '$ Temp: 98.1 F (36.7 C) 98 F (36.7 C) 98 F (36.7 C) 97.7 F (36.5 C)  TempSrc: Oral   Oral  SpO2: 100% 100% 100% 100%  Weight:      Height:       Physical Exam General: Alert female in NAD Heart: RRR, no murmurs, rubs or gallops Lungs: On O2 via [place]. Lungs CTA without wheezing, rhonchi or rales Abdomen: Soft, non-distended, +BS Extremities:L foot wrapped, R BKA w/dressing. Trace edema b/l lower extremities Dialysis Access: R IJ TDC. LUE AVF wrapped, improved edema, no drainage noted on dressing  Additional Objective Labs: Basic Metabolic Panel: Recent Labs  Lab 05/06/22 1110 05/08/22 0745 05/11/22 0256  NA 133* 131* 133*  K 4.3 4.1 3.2*  CL 95* 92* 96*  CO2 '25 26 26  '$ GLUCOSE 302* 318* 221*  BUN 77* 75* 48*  CREATININE 4.65* 4.68* 3.32*  CALCIUM 8.7* 8.9 8.8*  PHOS 5.0* 4.0 2.2*   Liver Function Tests: Recent Labs  Lab 05/06/22 1110 05/08/22 0745 05/11/22 0256  ALBUMIN 1.7* 1.8* 1.9*   No results for input(s): "LIPASE", "AMYLASE" in the last 168 hours. CBC: Recent Labs  Lab 05/06/22 1100 05/08/22 0339 05/08/22 0750 05/11/22 0256  WBC 10.3 8.2 9.2 8.9  HGB 7.9* 8.2* 8.2* 8.3*  HCT 25.8* 27.1* 28.0* 28.1*  MCV 81.6 81.6 82.6 81.4  PLT 315 338 366 409*   Blood Culture    Component Value Date/Time   SDES ABSCESS LEFT ARM 05/05/2022 1313   SPECREQUEST NONE 05/05/2022 1313   CULT  05/05/2022 1313    RARE SERRATIA MARCESCENS NO ANAEROBES ISOLATED Performed at [facility], [place] [address], [place], [place] [address]    REPTSTATUS 05/10/2022 FINAL 05/05/2022 1313     Cardiac Enzymes: No results for input(s): "CKTOTAL", "CKMB", "CKMBINDEX", "TROPONINI" in the last 168 hours. CBG: Recent Labs  Lab 05/11/22 0816 05/11/22 1248 05/11/22 1609 05/11/22 2007 05/12/22 0802  GLUCAP 291* 272* 242* 172* 245*   Iron Studies: No results for input(s): "IRON", "TIBC", "TRANSFERRIN", "FERRITIN" in the last 72 hours. '@lablastinr3'$ @ Studies/Results: No results found. Medications:   (feeding supplement) PROSource Plus  30 mL Oral BID BM   amiodarone  200 mg Oral Daily   apixaban  5 mg Oral BID   vitamin C  1,000 mg Oral Daily   aspirin EC  81 mg Oral Q breakfast   Chlorhexidine Gluconate Cloth  6 each Topical Q0600   darbepoetin (ARANESP) injection - DIALYSIS  100 mcg Subcutaneous Q Thu-1800   docusate sodium  100 mg Oral Daily   Gerhardt's butt cream   Topical TID   insulin aspart  0-5 Units Subcutaneous QHS   insulin aspart  0-9 Units Subcutaneous TID WC   insulin aspart  3 Units Subcutaneous TID WC   insulin glargine-yfgn  28 Units Subcutaneous Daily   levothyroxine  50 mcg Oral Q0600   linagliptin  5 mg Oral Daily   nutrition supplement (JUVEN)  1 packet Oral BID BM   pantoprazole  40 mg Oral Daily   pregabalin  75 mg Oral Daily   sulfamethoxazole-trimethoprim  1 tablet Oral Daily   vortioxetine HBr  20 mg Oral Daily   zinc sulfate  220 mg Oral Daily    Dialysis Orders: TTS at [facility] - fairly new, just started HD 03/2022   noted she will have new center  when transferred to rehab -SW TTS 4hr, 400/600, EDW 113.5kg, 2K/2Ca, TDC, heparin 2000 unit bolus - Mircera 111mg IV q 2 weeks (last given 11/30) - Venofer '100mg'$  x 10 ordered - Calcitriol 0.53m PO q HD  Assessment/Plan:  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 DuArlyss GandyNF for rehab 2.  LUE AVF wound infection sp I&D: Noted Serratia wound culture positive, [facility]VS following - s/p I&D.  Was on ceftaz per pharm dosing.  To be discharged home Bactrim for 10 days total/total antibiotic  coverage 14 days per [facility]VS , and renal dosed Bactrim  DS 1qd 3.  ESRD:  Continue HD on usual TTS schedule /noted her new kidney center when transferred to rehab will  be SW  center with HD TTS. Use all renal dosed meds ,  4.  Hypertension/volume: BP stable, chronically overloaded, 3 L UF Thursday HD 12/14 , 12/16  UF 2.9 some drop in BP at end and may be getting close to EDW now. 5.  Anemia: Hgb 8.3- continue aranesp 6.  Metabolic bone disease: Corrected calcium now 10.5 will hold VDRA, Phos 2.2.  DC Renvela phosphate binder for now, follow-up lab trend 7.  Nutrition:  Alb low, 1.8, added supplements. 8.  T2DM: mgt per admitting team 9.  A-fib - eliquis on hold for surgery , now resumed  SaAnice PaganiniPA-C 05/12/2022, 9:31 AM  [facility] Pager: [phone]

## 2022-05-12 NOTE — Discharge Summary (Addendum)
Discharge Diagnoses:  Principal Problem:   S/P BKA (below knee amputation) unilateral, right (HCC) Active Problems:   Gangrene of right foot ([place])   Surgeries: Procedure(s): IRRIGATION AND DEBRIDEMENT LEFT ARM AV FISTULA on 05/05/2022    Consultants: Treatment Team:  Marty Heck, MD Darliss Cheney, MD Roney Jaffe, MD  Discharged Condition: Improved  Discharge medications: Through communications with secure chat the skilled nursing facility does not want to use the insulin pump.  Orders were written to discontinue the insulin pump and recommend proceeding with a sliding scale insulin as needed.  The medical director for the skilled nursing facility will determine the frequency and dosage of the sliding scale insulin depending on patient's needs.  Hospital Course: Susan [name] is an 71 y.o. female who was admitted 05/02/2022 with a chief complaint of abscess right foot, with a final diagnosis of Infected left arm Arteriovenous fistula.  Patient was brought to the operating room on 05/05/2022 and underwent Procedure(s): IRRIGATION AND DEBRIDEMENT LEFT ARM AV FISTULA.    Patient was given perioperative antibiotics:  Anti-infectives (From admission, onward)    Start     Dose/Rate Route Frequency Ordered Stop   05/12/22 1000  sulfamethoxazole-trimethoprim (BACTRIM DS) 800-160 MG per tablet 1 tablet        1 tablet Oral Daily 05/11/22 1238     05/09/22 1445  sulfamethoxazole-trimethoprim (BACTRIM DS) 800-160 MG per tablet 2 tablet  Status:  Discontinued        2 tablet Oral Daily 05/09/22 1349 05/11/22 1238   05/09/22 1430  sulfamethoxazole-trimethoprim (BACTRIM DS) 800-160 MG per tablet 1 tablet  Status:  Discontinued        1 tablet Oral Every 12 hours 05/09/22 1342 05/09/22 1345   05/06/22 1200  vancomycin (VANCOREADY) IVPB 500 mg/100 mL  Status:  Discontinued        500 mg 100 mL/hr over 60 Minutes Intravenous Every T-Th-Sa (Hemodialysis) 05/03/22 1037 05/09/22 1336    05/03/22 1430  vancomycin (VANCOREADY) IVPB 1500 mg/300 mL        1,500 mg 150 mL/hr over 120 Minutes Intravenous  Once 05/03/22 1037 05/03/22 1520   05/03/22 1400  cefTAZidime (FORTAZ) 1 g in sodium chloride 0.9 % 100 mL IVPB  Status:  Discontinued        1 g 200 mL/hr over 30 Minutes Intravenous Every 24 hours 05/03/22 1037 05/09/22 1335   05/02/22 1800  ceFAZolin (ANCEF) IVPB 2g/100 mL premix        2 g 200 mL/hr over 30 Minutes Intravenous Every 8 hours 05/02/22 1350 05/03/22 0241   05/02/22 1445  fidaxomicin (DIFICID) tablet 200 mg  Status:  Discontinued        200 mg Oral 2 times daily 05/02/22 1349 05/02/22 1355   05/02/22 0745  ceFAZolin (ANCEF) IVPB 2g/100 mL premix        2 g 200 mL/hr over 30 Minutes Intravenous On call to O.R. 05/02/22 1610 05/02/22 0944     .  Patient was given sequential compression devices, early ambulation, and aspirin for DVT prophylaxis.  Recent vital signs: Patient Vitals for the past 24 hrs:  BP Temp Temp src Pulse Resp SpO2  05/12/22 1445 (!) 97/40 98.2 F (36.8 C) Oral 73 -- 100 %  05/12/22 0801 (!) 116/28 97.7 F (36.5 C) Oral 84 -- 100 %  05/11/22 2006 (!) 124/47 98 F (36.7 C) -- 87 17 100 %  05/11/22 1746 (!) 145/55 98 F (36.7 C) -- 90 18 100 %  .  Recent laboratory studies: No results found.  Discharge Medications:   Allergies as of 05/12/2022       Reactions   Keflex [cephalexin] Diarrhea   Codeine Nausea And Vomiting, Other (See Comments)        Medication List     STOP taking these medications    insulin pump Soln       TAKE these medications    albuterol 108 (90 Base) MCG/ACT inhaler Commonly known as: VENTOLIN HFA Inhale 1-2 puffs into the lungs every 6 (six) hours as needed for wheezing or shortness of breath.   amiodarone 200 MG tablet Commonly known as: PACERONE Take 1 tablet (200 mg total) by mouth daily.   apixaban 5 MG Tabs tablet Commonly known as: ELIQUIS Take 1 tablet (5 mg total) by mouth 2  (two) times daily.   aspirin EC 81 MG tablet Take 1 tablet (81 mg total) by mouth daily with breakfast.   DIALYVITE TABLET Tabs Take 1 tablet by mouth daily.   diphenoxylate-atropine 2.5-0.025 MG tablet Commonly known as: Lomotil Take 1 tablet by mouth 4 (four) times daily as needed for diarrhea or loose stools.   ferrous sulfate 325 (65 FE) MG tablet Take 325 mg by mouth 2 (two) times daily.   fidaxomicin 200 MG Tabs tablet Commonly known as: DIFICID Take 1 tablet (200 mg total) by mouth 2 (two) times daily.   FreeStyle Libre 2 Sensor Misc   hydrALAZINE 25 MG tablet Commonly known as: APRESOLINE Take 1 tablet (25 mg total) by mouth every 6 (six) hours as needed (SBP>150 or DBP>100).   levothyroxine 50 MCG tablet Commonly known as: SYNTHROID Take 1 tablet (50 mcg total) by mouth daily before breakfast.   linagliptin 5 MG Tabs tablet Commonly known as: TRADJENTA Take 1 tablet (5 mg total) by mouth daily.   magnesium oxide 400 (240 Mg) MG tablet Commonly known as: MAG-OX Take 1 tablet (400 mg total) by mouth daily.   NovoLIN R ReliOn 100 units/mL injection Generic drug: insulin regular Inject into the skin See admin instructions. Via Insulin Pump   OneTouch Delica Plus [id number] Misc Use as directed to check blood sugars as need up to 4 times daily   OneTouch Verio Flex System w/Device Kit Use up to four times daily as directed.   oxyCODONE-acetaminophen 5-325 MG tablet Commonly known as: PERCOCET/ROXICET Take 1 tablet by mouth every 4 (four) hours as needed. What changed: reasons to take this   OXYGEN Inhale 2 L/min into the lungs at bedtime.   Pentips 32G X 4 MM Misc Generic drug: Insulin Pen Needle Use as directed   pregabalin 75 MG capsule Commonly known as: LYRICA Take 1 capsule (75 mg total) by mouth daily.   sevelamer carbonate 800 MG tablet Commonly known as: RENVELA Take 1,600 mg by mouth 3 (three) times daily with meals.   vortioxetine HBr 10  MG Tabs tablet Commonly known as: Trintellix Take 2 tablets (20 mg total) by mouth daily.        Diagnostic Studies: No results found.  Patient benefited maximally from their hospital stay and there were no complications.     Disposition: Discharge disposition: 62-Rehab Facility      Discharge Instructions     Call MD / Call 911   Complete by: As directed    If you experience chest pain or shortness of breath, CALL 911 and be transported to the hospital emergency room.  If you develope a fever above 101 F, pus (  white drainage) or increased drainage or redness at the wound, or calf pain, call your surgeon's office.   Call MD / Call 911   Complete by: As directed    If you experience chest pain or shortness of breath, CALL 911 and be transported to the hospital emergency room.  If you develope a fever above 101 F, pus (white drainage) or increased drainage or redness at the wound, or calf pain, call your surgeon's office.   Constipation Prevention   Complete by: As directed    Drink plenty of fluids.  Prune juice may be helpful.  You may use a stool softener, such as Colace (over the counter) 100 mg twice a day.  Use MiraLax (over the counter) for constipation as needed.   Constipation Prevention   Complete by: As directed    Drink plenty of fluids.  Prune juice may be helpful.  You may use a stool softener, such as Colace (over the counter) 100 mg twice a day.  Use MiraLax (over the counter) for constipation as needed.   Diet - low sodium heart healthy   Complete by: As directed    Diet - low sodium heart healthy   Complete by: As directed    Increase activity slowly as tolerated   Complete by: As directed    Increase activity slowly as tolerated   Complete by: As directed    Negative Pressure Wound Therapy - Incisional   Complete by: As directed    Post-operative opioid taper instructions:   Complete by: As directed    POST-OPERATIVE OPIOID TAPER INSTRUCTIONS: It is  important to wean off of your opioid medication as soon as possible. If you do not need pain medication after your surgery it is ok to stop day one. Opioids include: Codeine, Hydrocodone(Norco, Vicodin), Oxycodone(Percocet, oxycontin) and hydromorphone amongst others.  Long term and even short term use of opiods can cause: Increased pain response Dependence Constipation Depression Respiratory depression And more.  Withdrawal symptoms can include Flu like symptoms Nausea, vomiting And more Techniques to manage these symptoms Hydrate well Eat regular healthy meals Stay active Use relaxation techniques(deep breathing, meditating, yoga) Do Not substitute Alcohol to help with tapering If you have been on opioids for less than two weeks and do not have pain than it is ok to stop all together.  Plan to wean off of opioids This plan should start within one week post op of your joint replacement. Maintain the same interval or time between taking each dose and first decrease the dose.  Cut the total daily intake of opioids by one tablet each day Next start to increase the time between doses. The last dose that should be eliminated is the evening dose.      Post-operative opioid taper instructions:   Complete by: As directed    POST-OPERATIVE OPIOID TAPER INSTRUCTIONS: It is important to wean off of your opioid medication as soon as possible. If you do not need pain medication after your surgery it is ok to stop day one. Opioids include: Codeine, Hydrocodone(Norco, Vicodin), Oxycodone(Percocet, oxycontin) and hydromorphone amongst others.  Long term and even short term use of opiods can cause: Increased pain response Dependence Constipation Depression Respiratory depression And more.  Withdrawal symptoms can include Flu like symptoms Nausea, vomiting And more Techniques to manage these symptoms Hydrate well Eat regular healthy meals Stay active Use relaxation techniques(deep  breathing, meditating, yoga) Do Not substitute Alcohol to help with tapering If you have been on opioids  for less than two weeks and do not have pain than it is ok to stop all together.  Plan to wean off of opioids This plan should start within one week post op of your joint replacement. Maintain the same interval or time between taking each dose and first decrease the dose.  Cut the total daily intake of opioids by one tablet each day Next start to increase the time between doses. The last dose that should be eliminated is the evening dose.          Follow-up Information     Newt Minion, MD Follow up in 1 week(s).   Specialty: Orthopedic Surgery Contact information: [address] [place] [address] [phone]         [facility] Follow up in 2 week(s).   Why: The office will call the patient with an appointment Contact information: [address] [place], [place]. Go on 05/10/2022.   Why: Schedule is Tuesday/Thursday/Saturday with 7:15 am chair time.  Patient can start on Saturday and will need to arrive at 7:00am. Contact information: [place] [address] [phone]                  Signed: Newt Minion 05/12/2022, 3:09 PM

## 2022-05-12 NOTE — TOC Progression Note (Addendum)
Transition of Care [facility]) - Progression Note    Patient Details  Name: Susan [name] MRN: [id number] Date of Birth: 06-11-1950  Transition of Care [facility]) CM/SW [place], [place] Phone Number: 05/12/2022, 2:55 PM  Clinical Narrative:     Patient has insurance authorization approval for SNF # [id number] PTAR # [id number]. CSW spoke with [place] with [facility] who confirmed patient can dc over today if medically ready.Perrin Smack confirmed she has all of patients HD information. CSW informed MD. CSW will continue to follow and assist with patients dc planning needs.  Expected Discharge Plan: [place] Barriers to Discharge: Continued Medical Work up, SNF Pending bed offer  Expected Discharge Plan and Services Expected Discharge Plan: [place] Choice: [place] arrangements for the past 2 months: Single Family Home Expected Discharge Date: 05/12/22                                     Social Determinants of Health (SDOH) Interventions Housing Interventions: Intervention Not Indicated Transportation Interventions: Intervention Not Indicated Utilities Interventions: Intervention Not Indicated  Readmission Risk Interventions    02/07/2022    2:57 PM 01/23/2022   12:38 PM 05/28/2020   12:50 PM  Readmission Risk Prevention Plan  Transportation Screening Complete Complete Complete  PCP or Specialist Appt within 3-5 Days  Complete   HRI or [place]  Complete   Social Work Consult for [place] Planning/Counseling  Complete   Palliative Care Screening  Not Applicable   Medication Review Press photographer) Complete Complete Complete  PCP or Specialist appointment within 3-5 days of discharge Complete  Complete  HRI or [place] Complete  Complete  SW Recovery Care/Counseling Consult Complete  Complete  Palliative Care Screening Not Applicable  Not [place] Not  Applicable  Not Applicable

## 2022-05-12 NOTE — Discharge Summary (Signed)
Discharge Diagnoses:  Principal Problem:   S/P BKA (below knee amputation) unilateral, right ([place]) Active Problems:   Gangrene of right foot ([place])   Surgeries: Procedure(s): IRRIGATION AND DEBRIDEMENT LEFT ARM AV FISTULA on 05/05/2022    Consultants: Treatment Team:  Marty Heck, MD Darliss Cheney, MD Roney Jaffe, MD  Discharged Condition: Improved  Hospital Course: Susan [name] is an 71 y.o. female who was admitted 05/02/2022 with a chief complaint of gangrene right foot, with a final diagnosis of  right below knee amputation and Infected left arm Arteriovenous fistula.  Patient was brought to the operating room on 05/05/2022 and underwent Procedure(s): IRRIGATION AND DEBRIDEMENT LEFT ARM AV FISTULA.    Patient was given perioperative antibiotics:  Anti-infectives (From admission, onward)    Start     Dose/Rate Route Frequency Ordered Stop   05/12/22 1000  sulfamethoxazole-trimethoprim (BACTRIM DS) 800-160 MG per tablet 1 tablet        1 tablet Oral Daily 05/11/22 1238     05/09/22 1445  sulfamethoxazole-trimethoprim (BACTRIM DS) 800-160 MG per tablet 2 tablet  Status:  Discontinued        2 tablet Oral Daily 05/09/22 1349 05/11/22 1238   05/09/22 1430  sulfamethoxazole-trimethoprim (BACTRIM DS) 800-160 MG per tablet 1 tablet  Status:  Discontinued        1 tablet Oral Every 12 hours 05/09/22 1342 05/09/22 1345   05/06/22 1200  vancomycin (VANCOREADY) IVPB 500 mg/100 mL  Status:  Discontinued        500 mg 100 mL/hr over 60 Minutes Intravenous Every T-Th-Sa (Hemodialysis) 05/03/22 1037 05/09/22 1336   05/03/22 1430  vancomycin (VANCOREADY) IVPB 1500 mg/300 mL        1,500 mg 150 mL/hr over 120 Minutes Intravenous  Once 05/03/22 1037 05/03/22 1520   05/03/22 1400  cefTAZidime (FORTAZ) 1 g in sodium chloride 0.9 % 100 mL IVPB  Status:  Discontinued        1 g 200 mL/hr over 30 Minutes Intravenous Every 24 hours 05/03/22 1037 05/09/22 1335   05/02/22 1800  ceFAZolin  (ANCEF) IVPB 2g/100 mL premix        2 g 200 mL/hr over 30 Minutes Intravenous Every 8 hours 05/02/22 1350 05/03/22 0241   05/02/22 1445  fidaxomicin (DIFICID) tablet 200 mg  Status:  Discontinued        200 mg Oral 2 times daily 05/02/22 1349 05/02/22 1355   05/02/22 0745  ceFAZolin (ANCEF) IVPB 2g/100 mL premix        2 g 200 mL/hr over 30 Minutes Intravenous On call to O.R. 05/02/22 0233 05/02/22 0944     .  Patient was given sequential compression devices, early ambulation, and aspirin for DVT prophylaxis.  Recent vital signs: Patient Vitals for the past 24 hrs:  BP Temp Pulse Resp SpO2  05/11/22 2006 (!) 124/47 98 F (36.7 C) 87 17 100 %  05/11/22 1746 (!) 145/55 98 F (36.7 C) 90 18 100 %  .  Recent laboratory studies: No results found.  Discharge Medications:   Allergies as of 05/12/2022       Reactions   Keflex [cephalexin] Diarrhea   Codeine Nausea And Vomiting, Other (See Comments)        Medication List     TAKE these medications    albuterol 108 (90 Base) MCG/ACT inhaler Commonly known as: VENTOLIN HFA Inhale 1-2 puffs into the lungs every 6 (six) hours as needed for wheezing or shortness of breath.  amiodarone 200 MG tablet Commonly known as: PACERONE Take 1 tablet (200 mg total) by mouth daily.   apixaban 5 MG Tabs tablet Commonly known as: ELIQUIS Take 1 tablet (5 mg total) by mouth 2 (two) times daily.   aspirin EC 81 MG tablet Take 1 tablet (81 mg total) by mouth daily with breakfast.   DIALYVITE TABLET Tabs Take 1 tablet by mouth daily.   diphenoxylate-atropine 2.5-0.025 MG tablet Commonly known as: Lomotil Take 1 tablet by mouth 4 (four) times daily as needed for diarrhea or loose stools.   ferrous sulfate 325 (65 FE) MG tablet Take 325 mg by mouth 2 (two) times daily.   fidaxomicin 200 MG Tabs tablet Commonly known as: DIFICID Take 1 tablet (200 mg total) by mouth 2 (two) times daily.   FreeStyle Libre 2 Sensor Misc    hydrALAZINE 25 MG tablet Commonly known as: APRESOLINE Take 1 tablet (25 mg total) by mouth every 6 (six) hours as needed (SBP>150 or DBP>100).   insulin pump Soln Inject 1 each into the skin 3 times daily with meals, bedtime and 2 AM.   levothyroxine 50 MCG tablet Commonly known as: SYNTHROID Take 1 tablet (50 mcg total) by mouth daily before breakfast.   linagliptin 5 MG Tabs tablet Commonly known as: TRADJENTA Take 1 tablet (5 mg total) by mouth daily.   magnesium oxide 400 (240 Mg) MG tablet Commonly known as: MAG-OX Take 1 tablet (400 mg total) by mouth daily.   NovoLIN R ReliOn 100 units/mL injection Generic drug: insulin regular Inject into the skin See admin instructions. Via Insulin Pump   OneTouch Delica Plus [id number] Misc Use as directed to check blood sugars as need up to 4 times daily   OneTouch Verio Flex System w/Device Kit Use up to four times daily as directed.   oxyCODONE-acetaminophen 5-325 MG tablet Commonly known as: PERCOCET/ROXICET Take 1 tablet by mouth every 4 (four) hours as needed. What changed: reasons to take this   OXYGEN Inhale 2 L/min into the lungs at bedtime.   Pentips 32G X 4 MM Misc Generic drug: Insulin Pen Needle Use as directed   pregabalin 75 MG capsule Commonly known as: LYRICA Take 1 capsule (75 mg total) by mouth daily.   sevelamer carbonate 800 MG tablet Commonly known as: RENVELA Take 1,600 mg by mouth 3 (three) times daily with meals.   vortioxetine HBr 10 MG Tabs tablet Commonly known as: Trintellix Take 2 tablets (20 mg total) by mouth daily.        Diagnostic Studies: No results found.  Patient benefited maximally from their hospital stay and there were no complications.     Disposition: Discharge disposition: 62-Rehab Facility      Discharge Instructions     Call MD / Call 911   Complete by: As directed    If you experience chest pain or shortness of breath, CALL 911 and be transported to the  hospital emergency room.  If you develope a fever above 101 F, pus (white drainage) or increased drainage or redness at the wound, or calf pain, call your surgeon's office.   Call MD / Call 911   Complete by: As directed    If you experience chest pain or shortness of breath, CALL 911 and be transported to the hospital emergency room.  If you develope a fever above 101 F, pus (white drainage) or increased drainage or redness at the wound, or calf pain, call your surgeon's office.   Constipation  Prevention   Complete by: As directed    Drink plenty of fluids.  Prune juice may be helpful.  You may use a stool softener, such as Colace (over the counter) 100 mg twice a day.  Use MiraLax (over the counter) for constipation as needed.   Constipation Prevention   Complete by: As directed    Drink plenty of fluids.  Prune juice may be helpful.  You may use a stool softener, such as Colace (over the counter) 100 mg twice a day.  Use MiraLax (over the counter) for constipation as needed.   Diet - low sodium heart healthy   Complete by: As directed    Diet - low sodium heart healthy   Complete by: As directed    Increase activity slowly as tolerated   Complete by: As directed    Increase activity slowly as tolerated   Complete by: As directed    Negative Pressure Wound Therapy - Incisional   Complete by: As directed    Post-operative opioid taper instructions:   Complete by: As directed    POST-OPERATIVE OPIOID TAPER INSTRUCTIONS: It is important to wean off of your opioid medication as soon as possible. If you do not need pain medication after your surgery it is ok to stop day one. Opioids include: Codeine, Hydrocodone(Norco, Vicodin), Oxycodone(Percocet, oxycontin) and hydromorphone amongst others.  Long term and even short term use of opiods can cause: Increased pain response Dependence Constipation Depression Respiratory depression And more.  Withdrawal symptoms can include Flu like  symptoms Nausea, vomiting And more Techniques to manage these symptoms Hydrate well Eat regular healthy meals Stay active Use relaxation techniques(deep breathing, meditating, yoga) Do Not substitute Alcohol to help with tapering If you have been on opioids for less than two weeks and do not have pain than it is ok to stop all together.  Plan to wean off of opioids This plan should start within one week post op of your joint replacement. Maintain the same interval or time between taking each dose and first decrease the dose.  Cut the total daily intake of opioids by one tablet each day Next start to increase the time between doses. The last dose that should be eliminated is the evening dose.      Post-operative opioid taper instructions:   Complete by: As directed    POST-OPERATIVE OPIOID TAPER INSTRUCTIONS: It is important to wean off of your opioid medication as soon as possible. If you do not need pain medication after your surgery it is ok to stop day one. Opioids include: Codeine, Hydrocodone(Norco, Vicodin), Oxycodone(Percocet, oxycontin) and hydromorphone amongst others.  Long term and even short term use of opiods can cause: Increased pain response Dependence Constipation Depression Respiratory depression And more.  Withdrawal symptoms can include Flu like symptoms Nausea, vomiting And more Techniques to manage these symptoms Hydrate well Eat regular healthy meals Stay active Use relaxation techniques(deep breathing, meditating, yoga) Do Not substitute Alcohol to help with tapering If you have been on opioids for less than two weeks and do not have pain than it is ok to stop all together.  Plan to wean off of opioids This plan should start within one week post op of your joint replacement. Maintain the same interval or time between taking each dose and first decrease the dose.  Cut the total daily intake of opioids by one tablet each day Next start to increase  the time between doses. The last dose that should be eliminated is  the evening dose.          Follow-up Information     Newt Minion, MD Follow up in 1 week(s).   Specialty: Orthopedic Surgery Contact information: [address] [place] [address] [phone]         [facility] Follow up in 2 week(s).   Why: The office will call the patient with an appointment Contact information: [address] [place], [place]. Go on 05/10/2022.   Why: Schedule is Tuesday/Thursday/Saturday with 7:15 am chair time.  Patient can start on Saturday and will need to arrive at 7:00am. Contact information: [place] [address] [phone]                  Signed: Newt Minion 05/12/2022, 6:48 AM

## 2022-05-12 NOTE — TOC Transition Note (Addendum)
Transition of Care [facility]) - CM/SW Discharge Note   Patient Details  Name: Susan [name] MRN: [id number] Date of Birth: 09-Jul-1950  Transition of Care [facility]) CM/SW Contact:  Milas Gain, [place] Phone Number: 05/12/2022, 3:21 PM   Clinical Narrative:     Patient will DC to: [facility]  Anticipated DC date: 05/12/2022  Family notified: Percell Miller  Transport by: Corey Harold   ?  Per MD patient ready for DC to [facility]. RN, patient, patient's family,Greg renal navigator, and facility notified of DC. Discharge Summary sent to facility. RN given number for report tele# [phone] RM# [phone]. DC packet on chart.DNR signed by MD attached to patients DC packet. Ambulance transport requested for patient.  CSW signing off.    Final next level of care: Skilled Nursing Facility Barriers to Discharge: No Barriers Identified   Patient Goals and CMS Choice Patient states their goals for this hospitalization and ongoing recovery are:: SNF CMS Medicare.gov Compare Post Acute Care list provided to:: Patient Represenative (must comment) (patient and patients spouse) Choice offered to / list presented to : Patient, Spouse  Discharge Placement              Patient chooses bed at: [facility] Patient to be transferred to facility by: [place] Name of family member notified: Percell Miller Patient and family notified of of transfer: 05/12/22  Discharge Plan and Services     Post Acute Care Choice: [place]                               Social Determinants of Health (SDOH) Interventions Housing Interventions: Intervention Not Indicated Transportation Interventions: Intervention Not Indicated Utilities Interventions: Intervention Not Indicated   Readmission Risk Interventions    02/07/2022    2:57 PM 01/23/2022   12:38 PM 05/28/2020   12:50 PM  Readmission Risk Prevention Plan  Transportation Screening Complete Complete Complete   PCP or Specialist Appt within 3-5 Days  Complete   HRI or Home Care Consult  Complete   Social Work Consult for [place] Planning/Counseling  Complete   Palliative Care Screening  Not Applicable   Medication Review Press photographer) Complete Complete Complete  PCP or Specialist appointment within 3-5 days of discharge Complete  Complete  HRI or [place] Complete  Complete  SW Recovery Care/Counseling Consult Complete  Complete  Palliative Care Screening Not Applicable  Not [place] Not Applicable  Not Applicable

## 2022-05-12 NOTE — Progress Notes (Signed)
Patient ID: Susan [name], female   DOB: [birth year], 71 y.o.   MRN: [id number] Insurance authorization obtained, discharge to SNF today

## 2022-05-12 NOTE — Care Management Important Message (Signed)
Important Message  Patient Details  Name: Susan [name] MRN: [id number] Date of Birth: 06-18-1950   Medicare Important Message Given:  Yes     Jocelyne Reinertsen [name] 05/12/2022, 3:40 PM

## 2022-05-12 NOTE — Inpatient Diabetes Management (Signed)
Inpatient Diabetes Program Recommendations  AACE/ADA: New Consensus Statement on Inpatient Glycemic Control  Target Ranges:  Prepandial:   less than 140 mg/dL      Peak postprandial:   less than 180 mg/dL (1-2 hours)      Critically ill patients:  140 - 180 mg/dL    Latest Reference Range & Units 05/11/22 08:16 05/11/22 12:48 05/11/22 16:09 05/11/22 20:07 05/12/22 08:02  Glucose-Capillary 70 - 99 mg/dL 291 (H) 272 (H) 242 (H) 172 (H) 245 (H)   Review of Glycemic Control  Diabetes history: DM1 Outpatient Diabetes medications: OmniPod with U100 Regular insulin (total basal 25.9 units/day, Carb coverage 1:12 grams, Sensitivity factor 1:40 mg/dl  Current orders for Inpatient glycemic control: Semglee 28 units daily, Novolog 3 units TID with meals, Novolog 0-9 units TID with meals, Novolog 0-5 units QHS, Tradjenta 5 mg daily   Inpatient Diabetes Program Recommendations:     Insulin: Please consider increasing Semglee to 30 units daily and meal coverage to Novolog 5 units TID with meals.   Thanks,  Barnie Alderman, RN, MSN, [place] Diabetes Coordinator Inpatient Diabetes Program [phone] (Team Pager from 8am to Drew)

## 2022-05-14 ENCOUNTER — Telehealth: Payer: Self-pay | Admitting: Orthopedic Surgery

## 2022-05-14 NOTE — Telephone Encounter (Signed)
SW [place] at [facility]. She confirmed pt had wound vac on. I let her know that the hospital had changed out her pump to praveena on day of discharge which was 05/12/22. This can stay in place until her post op appointment that is scheduled on 05/21/22 with Erin.

## 2022-05-14 NOTE — Telephone Encounter (Signed)
Debra From [facility] called in stating that pt had surgery on 12/08 for right leg amputee... Hilda Blades stated that pt needs follow up appt... Hilda Blades stated that pt will not let them change her bandage until she is seen by provider... Hilda Blades is requesting callback at [phone].Marland KitchenMarland Kitchen

## 2022-05-15 ENCOUNTER — Telehealth: Payer: Self-pay | Admitting: Physician Assistant

## 2022-05-15 NOTE — Telephone Encounter (Signed)
-----   Message from Karoline Caldwell, [place] sent at 05/07/2022  8:19 AM EST ----- S/p I&D left av fistula by Dr. Carlis Abbott. Needs follow up in 2 weeks for wound check/suture removal. Please try to arrange on PA schedule on Tuesday when Dr. Carlis Abbott in office. thanks

## 2022-05-16 ENCOUNTER — Telehealth (HOSPITAL_COMMUNITY): Payer: Self-pay

## 2022-05-16 ENCOUNTER — Telehealth: Payer: Self-pay | Admitting: Orthopedic Surgery

## 2022-05-16 NOTE — Telephone Encounter (Signed)
I called and lm on vm to cb with specific questions and I will return call.

## 2022-05-16 NOTE — Telephone Encounter (Signed)
Pt called requesting to speak to Autumn F. Pt did not leave a reason for call back. She just asked for a return call from Autumn F. Please call pt at [phone]

## 2022-05-16 NOTE — Telephone Encounter (Signed)
Advanced Heart Failure Patient Advocate Encounter  Attempt to call patient to discuss affordability of Eliquis. No answer, left voicemail.  Clista Bernhardt, CPhT Rx Patient Advocate Phone: [phone]

## 2022-05-20 ENCOUNTER — Telehealth: Payer: Self-pay

## 2022-05-20 NOTE — Telephone Encounter (Signed)
[employer] PA SENT TO [employer].   [name] (Key: [id number])  Your information has been sent to [employer].

## 2022-05-21 ENCOUNTER — Ambulatory Visit (INDEPENDENT_AMBULATORY_CARE_PROVIDER_SITE_OTHER): Payer: PPO

## 2022-05-21 ENCOUNTER — Encounter: Payer: PPO | Admitting: Family

## 2022-05-21 DIAGNOSIS — Z89511 Acquired absence of right leg below knee: Secondary | ICD-10-CM

## 2022-05-21 NOTE — Progress Notes (Signed)
Patient came in today for her first post op follow up. She is status post right BKA. Her wound vac was removed. She had no drainage in her canister. Her incision was intact. Gauze and ACE bandage were applied and limb protector was put back on. Instructions to use dial soap cleansing daily and use dry dressing to cover. She will follow up on 06/02/22. A sooner appointment was offered but she has dialysis on Tuesdays and Thursdays. She declined to see another provider as well. If any issues she or nursing facility will call.

## 2022-05-21 NOTE — Progress Notes (Deleted)
Office Visit Note   Patient: Susan [name]           Date of Birth: [birth year]           MRN: [id number] Visit Date: 05/21/2022              Requested by: [name], MD [address] [place],  [place] [address] PCP: [name], MD   Assessment & Plan: Visit Diagnoses: No diagnosis found.  Plan: ***  Follow-Up Instructions: No follow-ups on file.   Orders:  No orders of the defined types were placed in this encounter.  No orders of the defined types were placed in this encounter.     Procedures: No procedures performed   Clinical Data: No additional findings.   Subjective: No chief complaint on file.   HPI  Review of Systems   Objective: Vital Signs: There were no vitals taken for this visit.  Physical Exam  Ortho Exam  Specialty Comments:  No specialty comments available.  Imaging: No results found.   PMFS History: Patient Active Problem List   Diagnosis Date Noted   Gangrene of right foot ([place]) 05/02/2022   S/P BKA (below knee amputation) unilateral, right ([place]) 05/02/2022   Unspecified protein-calorie malnutrition ([place]) 04/15/2022   Secondary hyperparathyroidism of renal origin ([place]) 04/14/2022   Coagulation defect, unspecified ([place]) 04/09/2022   Acquired absence of other left toe(s) ([place]) 04/07/2022   Allergy, unspecified, initial encounter 04/07/2022   Dependence on renal dialysis ([place]) 04/07/2022   Gout due to renal impairment, unspecified site 04/07/2022   Hypertensive heart and chronic kidney disease with heart failure and with stage 5 chronic kidney disease, or end stage renal disease ([place]) 04/07/2022   Personal history of transient ischemic attack (TIA), and cerebral infarction without residual deficits 04/07/2022   Renal osteodystrophy 04/07/2022   Venous stasis ulcer of right calf ([place]) 03/31/2022   Fistula, colovaginal 03/26/2022   Diarrhea 03/26/2022   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   Sepsis without  acute organ dysfunction ([place])    Bacteremia    Acute pancreatitis 02/01/2022   Abdominal pain 02/01/2022   SIRS (systemic inflammatory response syndrome) ([place]) 02/01/2022   Transaminitis 02/01/2022   History of anemia due to chronic kidney disease 02/01/2022   Paroxysmal atrial fibrillation ([place]) 02/01/2022   DKA (diabetic ketoacidosis) ([place]) 01/14/2022   NSTEMI (non-ST elevated myocardial infarction) ([place]) 03/05/2021   Acute renal failure superimposed on stage 4 chronic kidney disease ([place]) 08/22/2020   Hypoalbuminemia 05/25/2020   GERD (gastroesophageal reflux disease) 05/25/2020   Pressure injury of skin 05/17/2020   Type 2 diabetes mellitus with diabetic polyneuropathy, with long-term current use of insulin ([place]) 03/07/2020   Obesity, Class III, BMI 40-49.9 (morbid obesity) ([place]) 03/07/2020   Common bile duct (CBD) obstruction 05/28/2019   Benign neoplasm of ascending colon    Benign neoplasm of transverse colon    Benign neoplasm of descending colon    Benign neoplasm of sigmoid colon    Gastric polyps    Hyperkalemia 03/11/2019   Prolonged QT interval 03/11/2019   Onychomycosis 06/21/2018   Osteomyelitis of second toe of right foot ([place])    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 (HCC)    PVD (peripheral vascular disease) ([place]) 10/26/2017   E-coli UTI 07/27/2017   Hypothyroidism 07/27/2017   AKI (acute kidney injury) ([place])    [place] (pulmonary artery hypertension) ([place])    Impaired ambulation 07/19/2017   Nausea & vomiting 07/15/2017  Leg cramps 02/27/2017   Peripheral edema 01/12/2017   Diabetic neuropathy ([place]) 11/12/2016   CKD (chronic kidney disease), stage IV ([place]) 10/24/2015   Anemia 10/03/2015   Generalized anxiety disorder 10/03/2015   Insomnia 10/03/2015   Hyperglycemia due to diabetes mellitus ([place]) 06/07/2015   Chronic diastolic CHF (congestive heart failure) ([place]) 06/07/2015   Non compliance with medical treatment 04/17/2014   Rotator  cuff tear 03/14/2014   Class 3 obesity ([place]) 09/23/2013   Chronic HFrEF (heart failure with reduced ejection fraction) ([place]) 06/03/2013   Hypotension 12/25/2012   Urinary incontinence    MDD (major depressive disorder) 11/12/2010   RBBB (right bundle branch block)    Coronary artery disease    Hyperlipemia 01/22/2009   Essential hypertension 01/22/2009   Past Medical History:  Diagnosis Date   Acute MI ([place]) 1999; 2007; 03/05/21   Anemia    hx   Anginal pain (HCC)    Anxiety    ARF (acute renal failure) ([place]) 06/2017   [facility]   Arthritis    "generalized" (03/15/2014)   CAD (coronary artery disease)    MI in 2000 - MI  2007 - treated bare metal stent (no nuclear since then as 9/11)   Carotid artery disease (HCC)    CHF (congestive heart failure) ([place])    HFrEF 03/06/21   Chronic diastolic heart failure ([place])    a) ECHO (08/2013) EF 55-60% and RV function nl b) RHC (08/2013) RA 4, RV 30/5/7, PA 25/10 (16), PCWP 7, Fick CO/CI 6.3/2.7, PVR 1.5 WU, PA 61 and 66%   Daily headache    "~ every other day; since I fell in June" (03/15/2014)   Depression    Dyslipidemia    Dyspnea    uses oxygen qhs via [place]   ESRD (end stage renal disease) ([place])    Dialysis on Tues Thurs Sat   Exertional shortness of breath    History of kidney stones    HTN (hypertension)    Hypothyroidism    Neuropathy    Obesity    Osteoarthritis    PAF (paroxysmal atrial fibrillation) (HCC)    Peripheral neuropathy    bilateral feet/hands   PONV (postoperative nausea and vomiting)    RBBB (right bundle branch block)    Old   Stroke ([place])    mini strokes   Syncope    likely due to low blood sugar   Tachycardia    Sinus tachycardia   Type II diabetes mellitus (HCC)    Type II, Lamar Sprinkles libre left upper arm. patient has omnipod insulin pump with Novolin R Insulin   Urinary incontinence    Venous insufficiency     Family History  Problem Relation Age of Onset   Heart attack Mother 54     Past Surgical History:  Procedure Laterality Date   ABDOMINAL HYSTERECTOMY  1980's   AMPUTATION Right 02/24/2018   Procedure: RIGHT FOOT GREAT TOE AND 2ND TOE AMPUTATION;  Surgeon: Newt Minion, MD;  Location: [place];  Service: Orthopedics;  Laterality: Right;   AMPUTATION Right 04/30/2018   Procedure: RIGHT TRANSMETATARSAL AMPUTATION;  Surgeon: Newt Minion, MD;  Location: [place];  Service: Orthopedics;  Laterality: Right;   AMPUTATION Right 05/02/2022   Procedure: RIGHT BELOW KNEE AMPUTATION;  Surgeon: Newt Minion, MD;  Location: [place];  Service: Orthopedics;  Laterality: Right;   AV FISTULA PLACEMENT Left 04/02/2022   Procedure: LEFT ARM ARTERIOVENOUS (AV) FISTULA CREATION;  Surgeon: Harold Barban  W, MD;  Location: [place];  Service: Vascular;  Laterality: Left;  PERIPHERAL NERVE BLOCK   BIOPSY  05/27/2020   Procedure: BIOPSY;  Surgeon: [name], DO;  [facility];  Service: Endoscopy;;   CATARACT EXTRACTION, BILATERAL Bilateral ?2013   COLONOSCOPY W/ POLYPECTOMY     COLONOSCOPY WITH PROPOFOL N/A 03/13/2019   Procedure: COLONOSCOPY WITH PROPOFOL;  Surgeon: Jerene Bears, MD;  Location: [place];  Service: Gastroenterology;  Laterality: N/A;   CORONARY ANGIOPLASTY WITH STENT PLACEMENT  1999; 2007   "1 + 1"   ERCP N/A 02/03/2022   Procedure: ENDOSCOPIC RETROGRADE CHOLANGIOPANCREATOGRAPHY (ERCP);  Surgeon: Carol Ada, MD;  Location: [place];  Service: Gastroenterology;  Laterality: N/A;   ESOPHAGOGASTRODUODENOSCOPY (EGD) WITH PROPOFOL N/A 03/13/2019   Procedure: ESOPHAGOGASTRODUODENOSCOPY (EGD) WITH PROPOFOL;  Surgeon: Jerene Bears, MD;  Location[facility];  Service: Gastroenterology;  Laterality: N/A;   ESOPHAGOGASTRODUODENOSCOPY (EGD) WITH PROPOFOL N/A 05/27/2020   Procedure: ESOPHAGOGASTRODUODENOSCOPY (EGD) WITH PROPOFOL;  Surgeon: [name], DO;  [facility];  Service: Endoscopy;  Laterality: N/A;   EYE SURGERY Bilateral    lazer    HEMOSTASIS CLIP PLACEMENT  03/13/2019   Procedure: HEMOSTASIS CLIP PLACEMENT;  Surgeon: Jerene Bears, MD;  Location: [place] [facility];  Service: Gastroenterology;;   I & D EXTREMITY Left 05/05/2022   Procedure: IRRIGATION AND DEBRIDEMENT LEFT ARM AV FISTULA;  Surgeon: Marty Heck, MD;  Location: [place];  Service: Vascular;  Laterality: Left;   INSERTION OF DIALYSIS CATHETER Right 04/02/2022   Procedure: INSERTION OF TUNNELED DIALYSIS CATHETER;  Surgeon: Serafina Mitchell, MD;  Location: [place];  Service: Vascular;  Laterality: Right;   KNEE ARTHROSCOPY Left 10/25/2006   POLYPECTOMY  03/13/2019   Procedure: POLYPECTOMY;  Surgeon: Jerene Bears, MD;  Location: [place] [facility];  Service: Gastroenterology;;   REMOVAL OF STONES  02/03/2022   Procedure: REMOVAL OF STONES;  Surgeon: Carol Ada, MD;  Location: [place];  Service: Gastroenterology;;   RIGHT HEART CATH N/A 07/24/2017   Procedure: RIGHT HEART CATH;  Surgeon: Jolaine Artist, MD;  Location: [place] CV LAB;  Service: Cardiovascular;  Laterality: N/A;   RIGHT HEART CATHETERIZATION N/A 09/22/2013   Procedure: RIGHT HEART CATH;  Surgeon: Jolaine Artist, MD;  Location: [facility] LAB;  Service: Cardiovascular;  Laterality: N/A;   SHOULDER ARTHROSCOPY WITH OPEN ROTATOR CUFF REPAIR Right 03/14/2014   Procedure: RIGHT SHOULDER ARTHROSCOPY WITH BICEPS RELEASE, OPEN SUBSCAPULA REPAIR, OPEN SUPRASPINATUS REPAIR.;  Surgeon: Meredith Pel, MD;  Location: [place];  Service: Orthopedics;  Laterality: Right;   SPHINCTEROTOMY  02/03/2022   Procedure: SPHINCTEROTOMY;  Surgeon: Carol Ada, MD;  Location: [place];  Service: Gastroenterology;;   TEE WITHOUT CARDIOVERSION N/A 02/04/2022   Procedure: TRANSESOPHAGEAL ECHOCARDIOGRAM (TEE);  Surgeon: Jolaine Artist, MD;  Location[facility] ENDOSCOPY;  Service: Cardiovascular;  Laterality: N/A;   TOE AMPUTATION Right 02/24/2018   GREAT TOE AND 2ND TOE AMPUTATION   TUBAL LIGATION  1970's    Social History   Occupational History    Employer: UNEMPLOYED  Tobacco Use   Smoking status: Former    Packs/day: 3.00    Years: 32.00    Total pack years: 96.00    Types: Cigarettes    Quit date: 10/24/1997    Years since quitting: 24.5   Smokeless tobacco: Never  Vaping Use   Vaping Use: Never used  Substance and Sexual Activity   Alcohol use: Not Currently  Comment: "might have 2-3 daiquiris in the summer"   Drug use: No   Sexual activity: Not Currently    Birth control/protection: Surgical    Comment: Hysterectomy

## 2022-05-22 ENCOUNTER — Other Ambulatory Visit: Payer: Self-pay

## 2022-05-22 ENCOUNTER — Inpatient Hospital Stay (HOSPITAL_COMMUNITY)
Admission: EM | Admit: 2022-05-22 | Discharge: 2022-05-29 | DRG: 329 | Disposition: A | Discharge status: Skilled Nursing Facility | Payer: PPO | Attending: Family Medicine | Admitting: Family Medicine

## 2022-05-22 DIAGNOSIS — L280 Lichen simplex chronicus: Secondary | ICD-10-CM | POA: Diagnosis present

## 2022-05-22 DIAGNOSIS — Z1152 Encounter for screening for COVID-19: Secondary | ICD-10-CM

## 2022-05-22 DIAGNOSIS — I4510 Unspecified right bundle-branch block: Secondary | ICD-10-CM | POA: Diagnosis present

## 2022-05-22 DIAGNOSIS — Z8673 Personal history of transient ischemic attack (TIA), and cerebral infarction without residual deficits: Secondary | ICD-10-CM

## 2022-05-22 DIAGNOSIS — T45515A Adverse effect of anticoagulants, initial encounter: Secondary | ICD-10-CM | POA: Diagnosis present

## 2022-05-22 DIAGNOSIS — Z6838 Body mass index (BMI) 38.0-38.9, adult: Secondary | ICD-10-CM

## 2022-05-22 DIAGNOSIS — R578 Other shock: Secondary | ICD-10-CM | POA: Diagnosis present

## 2022-05-22 DIAGNOSIS — K922 Gastrointestinal hemorrhage, unspecified: Secondary | ICD-10-CM | POA: Diagnosis present

## 2022-05-22 DIAGNOSIS — J9611 Chronic respiratory failure with hypoxia: Secondary | ICD-10-CM | POA: Diagnosis present

## 2022-05-22 DIAGNOSIS — Z789 Other specified health status: Secondary | ICD-10-CM

## 2022-05-22 DIAGNOSIS — K626 Ulcer of anus and rectum: Secondary | ICD-10-CM | POA: Diagnosis not present

## 2022-05-22 DIAGNOSIS — D490 Neoplasm of unspecified behavior of digestive system: Secondary | ICD-10-CM | POA: Diagnosis present

## 2022-05-22 DIAGNOSIS — Z955 Presence of coronary angioplasty implant and graft: Secondary | ICD-10-CM

## 2022-05-22 DIAGNOSIS — Z8249 Family history of ischemic heart disease and other diseases of the circulatory system: Secondary | ICD-10-CM

## 2022-05-22 DIAGNOSIS — I480 Paroxysmal atrial fibrillation: Secondary | ICD-10-CM | POA: Diagnosis present

## 2022-05-22 DIAGNOSIS — F419 Anxiety disorder, unspecified: Secondary | ICD-10-CM | POA: Diagnosis present

## 2022-05-22 DIAGNOSIS — Z794 Long term (current) use of insulin: Secondary | ICD-10-CM

## 2022-05-22 DIAGNOSIS — Z87442 Personal history of urinary calculi: Secondary | ICD-10-CM

## 2022-05-22 DIAGNOSIS — E1165 Type 2 diabetes mellitus with hyperglycemia: Secondary | ICD-10-CM | POA: Diagnosis present

## 2022-05-22 DIAGNOSIS — Z7401 Bed confinement status: Secondary | ICD-10-CM

## 2022-05-22 DIAGNOSIS — I5042 Chronic combined systolic (congestive) and diastolic (congestive) heart failure: Secondary | ICD-10-CM | POA: Diagnosis present

## 2022-05-22 DIAGNOSIS — I5023 Acute on chronic systolic (congestive) heart failure: Secondary | ICD-10-CM | POA: Diagnosis present

## 2022-05-22 DIAGNOSIS — I5082 Biventricular heart failure: Secondary | ICD-10-CM | POA: Diagnosis present

## 2022-05-22 DIAGNOSIS — I5022 Chronic systolic (congestive) heart failure: Secondary | ICD-10-CM | POA: Diagnosis present

## 2022-05-22 DIAGNOSIS — E669 Obesity, unspecified: Secondary | ICD-10-CM | POA: Diagnosis present

## 2022-05-22 DIAGNOSIS — Z888 Allergy status to other drugs, medicaments and biological substances status: Secondary | ICD-10-CM

## 2022-05-22 DIAGNOSIS — Z79899 Other long term (current) drug therapy: Secondary | ICD-10-CM

## 2022-05-22 DIAGNOSIS — E1122 Type 2 diabetes mellitus with diabetic chronic kidney disease: Secondary | ICD-10-CM | POA: Diagnosis present

## 2022-05-22 DIAGNOSIS — Z7982 Long term (current) use of aspirin: Secondary | ICD-10-CM

## 2022-05-22 DIAGNOSIS — E1142 Type 2 diabetes mellitus with diabetic polyneuropathy: Secondary | ICD-10-CM

## 2022-05-22 DIAGNOSIS — D62 Acute posthemorrhagic anemia: Secondary | ICD-10-CM | POA: Diagnosis present

## 2022-05-22 DIAGNOSIS — T185XXA Foreign body in anus and rectum, initial encounter: Secondary | ICD-10-CM | POA: Diagnosis not present

## 2022-05-22 DIAGNOSIS — A0471 Enterocolitis due to Clostridium difficile, recurrent: Secondary | ICD-10-CM | POA: Diagnosis present

## 2022-05-22 DIAGNOSIS — Z87891 Personal history of nicotine dependence: Secondary | ICD-10-CM

## 2022-05-22 DIAGNOSIS — D631 Anemia in chronic kidney disease: Secondary | ICD-10-CM | POA: Diagnosis present

## 2022-05-22 DIAGNOSIS — Z66 Do not resuscitate: Secondary | ICD-10-CM | POA: Diagnosis not present

## 2022-05-22 DIAGNOSIS — I252 Old myocardial infarction: Secondary | ICD-10-CM

## 2022-05-22 DIAGNOSIS — I2510 Atherosclerotic heart disease of native coronary artery without angina pectoris: Secondary | ICD-10-CM | POA: Diagnosis present

## 2022-05-22 DIAGNOSIS — Z9181 History of falling: Secondary | ICD-10-CM

## 2022-05-22 DIAGNOSIS — R197 Diarrhea, unspecified: Secondary | ICD-10-CM | POA: Diagnosis present

## 2022-05-22 DIAGNOSIS — I132 Hypertensive heart and chronic kidney disease with heart failure and with stage 5 chronic kidney disease, or end stage renal disease: Secondary | ICD-10-CM | POA: Diagnosis present

## 2022-05-22 DIAGNOSIS — Z9641 Presence of insulin pump (external) (internal): Secondary | ICD-10-CM | POA: Diagnosis present

## 2022-05-22 DIAGNOSIS — F32A Depression, unspecified: Secondary | ICD-10-CM | POA: Diagnosis present

## 2022-05-22 DIAGNOSIS — R32 Unspecified urinary incontinence: Secondary | ICD-10-CM | POA: Diagnosis present

## 2022-05-22 DIAGNOSIS — Z89511 Acquired absence of right leg below knee: Secondary | ICD-10-CM

## 2022-05-22 DIAGNOSIS — E039 Hypothyroidism, unspecified: Secondary | ICD-10-CM | POA: Diagnosis present

## 2022-05-22 DIAGNOSIS — Z9981 Dependence on supplemental oxygen: Secondary | ICD-10-CM

## 2022-05-22 DIAGNOSIS — I872 Venous insufficiency (chronic) (peripheral): Secondary | ICD-10-CM | POA: Diagnosis present

## 2022-05-22 DIAGNOSIS — M1990 Unspecified osteoarthritis, unspecified site: Secondary | ICD-10-CM | POA: Diagnosis present

## 2022-05-22 DIAGNOSIS — I9589 Other hypotension: Secondary | ICD-10-CM | POA: Diagnosis present

## 2022-05-22 DIAGNOSIS — E1151 Type 2 diabetes mellitus with diabetic peripheral angiopathy without gangrene: Secondary | ICD-10-CM | POA: Diagnosis present

## 2022-05-22 DIAGNOSIS — N186 End stage renal disease: Secondary | ICD-10-CM

## 2022-05-22 DIAGNOSIS — K649 Unspecified hemorrhoids: Secondary | ICD-10-CM | POA: Diagnosis present

## 2022-05-22 DIAGNOSIS — Z992 Dependence on renal dialysis: Secondary | ICD-10-CM

## 2022-05-22 DIAGNOSIS — Z885 Allergy status to narcotic agent status: Secondary | ICD-10-CM

## 2022-05-22 DIAGNOSIS — D6832 Hemorrhagic disorder due to extrinsic circulating anticoagulants: Secondary | ICD-10-CM | POA: Diagnosis present

## 2022-05-22 DIAGNOSIS — I959 Hypotension, unspecified: Secondary | ICD-10-CM | POA: Diagnosis present

## 2022-05-22 DIAGNOSIS — E785 Hyperlipidemia, unspecified: Secondary | ICD-10-CM | POA: Diagnosis present

## 2022-05-22 DIAGNOSIS — Z79890 Hormone replacement therapy (postmenopausal): Secondary | ICD-10-CM

## 2022-05-22 LAB — I-STAT CHEM 8, ED
BUN: 28 mg/dL — ABNORMAL HIGH (ref 8–23)
Calcium, Ion: 1.09 mmol/L — ABNORMAL LOW (ref 1.15–1.40)
Chloride: 94 mmol/L — ABNORMAL LOW (ref 98–111)
Creatinine, Ser: 2.7 mg/dL — ABNORMAL HIGH (ref 0.44–1.00)
Glucose, Bld: 357 mg/dL — ABNORMAL HIGH (ref 70–99)
HCT: 30 % — ABNORMAL LOW (ref 36.0–46.0)
Hemoglobin: 10.2 g/dL — ABNORMAL LOW (ref 12.0–15.0)
Potassium: 3.6 mmol/L (ref 3.5–5.1)
Sodium: 133 mmol/L — ABNORMAL LOW (ref 135–145)
TCO2: 27 mmol/L (ref 22–32)

## 2022-05-22 LAB — COMPREHENSIVE METABOLIC PANEL
ALT: 9 U/L (ref 0–44)
AST: 25 U/L (ref 15–41)
Albumin: 2.1 g/dL — ABNORMAL LOW (ref 3.5–5.0)
Alkaline Phosphatase: 110 U/L (ref 38–126)
Anion gap: 11 (ref 5–15)
BUN: 26 mg/dL — ABNORMAL HIGH (ref 8–23)
CO2: 24 mmol/L (ref 22–32)
Calcium: 8.6 mg/dL — ABNORMAL LOW (ref 8.9–10.3)
Chloride: 96 mmol/L — ABNORMAL LOW (ref 98–111)
Creatinine, Ser: 2.8 mg/dL — ABNORMAL HIGH (ref 0.44–1.00)
GFR, Estimated: 17 mL/min — ABNORMAL LOW (ref >60)
Glucose, Bld: 344 mg/dL — ABNORMAL HIGH (ref 70–99)
Potassium: 3.6 mmol/L (ref 3.5–5.1)
Sodium: 131 mmol/L — ABNORMAL LOW (ref 135–145)
Total Bilirubin: <0.1 mg/dL — ABNORMAL LOW (ref 0.3–1.2)
Total Protein: 5.9 g/dL — ABNORMAL LOW (ref 6.5–8.1)

## 2022-05-22 LAB — CBC WITH DIFFERENTIAL/PLATELET
Abs Immature Granulocytes: 0.09 10*3/uL — ABNORMAL HIGH (ref 0.00–0.07)
Basophils Absolute: 0.1 10*3/uL (ref 0.0–0.1)
Basophils Relative: 1 %
Eosinophils Absolute: 0.2 10*3/uL (ref 0.0–0.5)
Eosinophils Relative: 3 %
HCT: 32.1 % — ABNORMAL LOW (ref 36.0–46.0)
Hemoglobin: 9 g/dL — ABNORMAL LOW (ref 12.0–15.0)
Immature Granulocytes: 1 %
Lymphocytes Relative: 6 %
Lymphs Abs: 0.5 10*3/uL — ABNORMAL LOW (ref 0.7–4.0)
MCH: 23.9 pg — ABNORMAL LOW (ref 26.0–34.0)
MCHC: 28 g/dL — ABNORMAL LOW (ref 30.0–36.0)
MCV: 85.1 fL (ref 80.0–100.0)
Monocytes Absolute: 0.9 10*3/uL (ref 0.1–1.0)
Monocytes Relative: 9 %
Neutro Abs: 7.6 10*3/uL (ref 1.7–7.7)
Neutrophils Relative %: 80 %
Platelets: 265 10*3/uL (ref 150–400)
RBC: 3.77 MIL/uL — ABNORMAL LOW (ref 3.87–5.11)
RDW: 19.9 % — ABNORMAL HIGH (ref 11.5–15.5)
WBC: 9.4 10*3/uL (ref 4.0–10.5)
nRBC: 0 % (ref 0.0–0.2)

## 2022-05-22 LAB — PREPARE RBC (CROSSMATCH)

## 2022-05-22 LAB — PROTIME-INR
INR: 1.4 — ABNORMAL HIGH (ref 0.8–1.2)
Prothrombin Time: 16.6 seconds — ABNORMAL HIGH (ref 11.4–15.2)

## 2022-05-22 LAB — APTT: aPTT: 29 seconds (ref 24–36)

## 2022-05-22 LAB — CBG MONITORING, ED: Glucose-Capillary: 342 mg/dL — ABNORMAL HIGH (ref 70–99)

## 2022-05-22 MED ORDER — PROTHROMBIN COMPLEX CONC HUMAN 500 UNITS IV KIT
4760.0000 [IU] | PACK | Status: AC
  Administered 2022-05-22: 4760 [IU] via INTRAVENOUS
  Filled 2022-05-22: qty 4760

## 2022-05-22 MED ORDER — HYDROXYZINE HCL 10 MG PO TABS
10.0000 mg | ORAL_TABLET | Freq: Once | ORAL | Status: AC
  Administered 2022-05-23: 10 mg via ORAL
  Filled 2022-05-22: qty 1

## 2022-05-22 MED ORDER — SODIUM CHLORIDE 0.9% IV SOLUTION: Freq: Once | INTRAVENOUS | Status: AC

## 2022-05-22 MED ORDER — SODIUM CHLORIDE 0.9% IV SOLUTION: Freq: Once | INTRAVENOUS | Status: DC

## 2022-05-22 NOTE — ED Provider Notes (Signed)
[name] EMERGENCY DEPARTMENT Provider Note   CSN: [id number] Arrival date & time: 05/22/22  2159     History  Chief Complaint  Patient presents with   Rectal Bleeding    Susan [name] is a 71 y.o. female.  HPI 71 year old female with an extensive medical history including hypothyroidism, CHF, MI, CAD, ESRD on dialysis Tuesday/Thursday/Saturday, paroxysmal A-fib on Eliquis, kidney stones, DM type II, hypertension [place] to the ER with concerns for abdominal pain and dark diarrhea for the last week.  Reports compliance with Eliquis.  She reports that the facility had noticed onset about a week ago.  Denies any dizziness though she is largely bedbound.  She arrived hypotensive but alert and oriented.  Flank blood noted in diaper.  Per chart review, appears to have had a ERCP with Dr. Benson Norway in September of this year.  She denies any history of GI bleed. H/O benign neoplasm of the colon     Home Medications Prior to Admission medications   Medication Sig Start Date End Date Taking? Authorizing Provider  albuterol (VENTOLIN HFA) 108 (90 Base) MCG/ACT inhaler Inhale 1-2 puffs into the lungs every 6 (six) hours as needed for wheezing or shortness of breath.    [provider]  amiodarone (PACERONE) 200 MG tablet Take 1 tablet (200 mg total) by mouth daily. 01/29/22   Milford, Maricela Bo, FNP  apixaban (ELIQUIS) 5 MG TABS tablet Take 1 tablet (5 mg total) by mouth 2 (two) times daily. 01/29/22   Rafael Bihari, FNP  aspirin EC 81 MG tablet Take 1 tablet (81 mg total) by mouth daily with breakfast. 05/28/20   Roxan Hockey, MD  B Complex-C-Folic Acid (DIALYVITE TABLET) TABS Take 1 tablet by mouth daily. 04/22/22   [provider]  blood glucose meter kit and supplies Use up to four times daily as directed. 01/23/22   Ghimire, Henreitta Leber, MD  Continuous Blood Gluc Sensor (FREESTYLE LIBRE 2 SENSOR) MISC  01/14/22   [provider]  diphenoxylate-atropine  (LOMOTIL) 2.5-0.025 MG tablet Take 1 tablet by mouth 4 (four) times daily as needed for diarrhea or loose stools. 08/19/21   [name], MD  ferrous sulfate 325 (65 FE) MG tablet Take 325 mg by mouth 2 (two) times daily.    [provider]  fidaxomicin (DIFICID) 200 MG TABS tablet Take 1 tablet (200 mg total) by mouth 2 (two) times daily. 03/28/22   Rosiland Oz, MD  Fingerstix Lancets MISC Use as directed to check blood sugars as need up to 4 times daily 01/23/22   Ghimire, Henreitta Leber, MD  hydrALAZINE (APRESOLINE) 25 MG tablet Take 1 tablet (25 mg total) by mouth every 6 (six) hours as needed (SBP>150 or DBP>100). 04/06/22   Nita Sells, MD  Insulin Pen Needle 32G X 4 MM MISC Use as directed 01/23/22   Ghimire, Henreitta Leber, MD  insulin regular (NOVOLIN R RELION) 100 units/mL injection Inject into the skin See admin instructions. Via Insulin Pump    [provider]  levothyroxine (SYNTHROID, LEVOTHROID) 50 MCG tablet Take 1 tablet (50 mcg total) by mouth daily before breakfast. 11/07/16   [name], PA-C  linagliptin (TRADJENTA) 5 MG TABS tablet Take 1 tablet (5 mg total) by mouth daily. 04/07/22   Nita Sells, MD  magnesium oxide (MAG-OX) 400 (240 Mg) MG tablet Take 1 tablet (400 mg total) by mouth daily. 07/18/21   [name], MD  oxyCODONE-acetaminophen (PERCOCET/ROXICET) 5-325 MG tablet Take  1 tablet by mouth every 4 (four) hours as needed. 05/08/22   Newt Minion, MD  OXYGEN Inhale 2 L/min into the lungs at bedtime.    [provider]  pregabalin (LYRICA) 75 MG capsule Take 1 capsule (75 mg total) by mouth daily. 04/07/22   Nita Sells, MD  sevelamer carbonate (RENVELA) 800 MG tablet Take 1,600 mg by mouth 3 (three) times daily with meals.    [provider]  vortioxetine HBr (TRINTELLIX) 10 MG TABS tablet Take 2 tablets (20 mg total) by mouth daily. 04/23/22   [name], MD      Allergies    Keflex  [cephalexin] and Codeine    Review of Systems   Review of Systems Ten systems reviewed and are negative for acute change, except as noted in the HPI.   Physical Exam Updated Vital Signs BP 100/62   Pulse 85   Temp 98.3 F (36.8 C) (Oral)   Resp 15   Wt 98 kg   SpO2 100%   BMI 35.95 kg/m  Physical Exam Vitals and nursing note reviewed.  Constitutional:      General: She is not in acute distress.    Appearance: She is well-developed.  HENT:     Head: Normocephalic and atraumatic.  Eyes:     Conjunctiva/sclera: Conjunctivae normal.  Cardiovascular:     Rate and Rhythm: Normal rate and regular rhythm.     Heart sounds: No murmur heard. Pulmonary:     Effort: Pulmonary effort is normal. No respiratory distress.     Breath sounds: Normal breath sounds.  Abdominal:     Palpations: Abdomen is soft.     Tenderness: There is abdominal tenderness.     Comments: Lower abdomen diffusely tender.  No peritoneal signs, no guarding.  Patient refusing digital rectal exam, but frank red blood grossly seen in brief  Musculoskeletal:        General: No swelling.     Cervical back: Neck supple.  Skin:    General: Skin is warm and dry.     Capillary Refill: Capillary refill takes less than 2 seconds.  Neurological:     Mental Status: She is alert.  Psychiatric:        Mood and Affect: Mood normal.     ED Results / Procedures / Treatments   Labs (all labs ordered are listed, but only abnormal results are displayed) Labs Reviewed  CBC WITH DIFFERENTIAL/PLATELET - Abnormal; Notable for the following components:      Result Value   RBC 3.77 (*)    Hemoglobin 9.0 (*)    HCT 32.1 (*)    MCH 23.9 (*)    MCHC 28.0 (*)    RDW 19.9 (*)    Lymphs Abs 0.5 (*)    Abs Immature Granulocytes 0.09 (*)    All other components within normal limits  COMPREHENSIVE METABOLIC PANEL - Abnormal; Notable for the following components:   Sodium 131 (*)    Chloride 96 (*)    Glucose, Bld 344 (*)     BUN 26 (*)    Creatinine, Ser 2.80 (*)    Calcium 8.6 (*)    Total Protein 5.9 (*)    Albumin 2.1 (*)    Total Bilirubin <0.1 (*)    GFR, Estimated 17 (*)    All other components within normal limits  PROTIME-INR - Abnormal; Notable for the following components:   Prothrombin Time 16.6 (*)    INR 1.4 (*)  All other components within normal limits  CBG MONITORING, ED - Abnormal; Notable for the following components:   Glucose-Capillary 342 (*)    All other components within normal limits  I-STAT CHEM 8, ED - Abnormal; Notable for the following components:   Sodium 133 (*)    Chloride 94 (*)    BUN 28 (*)    Creatinine, Ser 2.70 (*)    Glucose, Bld 357 (*)    Calcium, Ion 1.09 (*)    Hemoglobin 10.2 (*)    HCT 30.0 (*)    All other components within normal limits  APTT  LACTIC ACID, PLASMA  LACTIC ACID, PLASMA  CBC WITH DIFFERENTIAL/PLATELET  TYPE AND SCREEN  PREPARE RBC (CROSSMATCH)  PREPARE RBC (CROSSMATCH)    EKG None  Radiology CT Angio Abd/Pel W and/or Wo Contrast  Result Date: 05/23/2022 CLINICAL DATA:  Lower GI bleed.  Bloody diarrhea x4 days. EXAM: CTA ABDOMEN AND PELVIS WITHOUT AND WITH CONTRAST TECHNIQUE: Multidetector CT imaging of the abdomen and pelvis was performed using the standard protocol during bolus administration of intravenous contrast. Multiplanar reconstructed images and MIPs were obtained and reviewed to evaluate the vascular anatomy. RADIATION DOSE REDUCTION: This exam was performed according to the departmental dose-optimization program which includes automated exposure control, adjustment of the mA and/or kV according to patient size and/or use of iterative reconstruction technique. CONTRAST:  145m OMNIPAQUE IOHEXOL 350 MG/ML SOLN COMPARISON:  CT of abdomen and pelvis studies, both without contrast, dated 03/27/2022 and 08/22/2020. There is also an MRCP from 02/01/2022. FINDINGS: VASCULAR Aorta: There are moderate patchy calcific plaques without  aneurysm, flow-limiting stenosis or dissection. Celiac: There are patchy calcific plaques of the celiac artery and its major branch arteries. Ostial calcific plaques cause 40% origin stenosis but there is no other focal stenosis, no dissection or aneurysm. There are extensive calcifications in the hepatic and splenic parenchymal small arteries. SMA: There are moderate calcific plaques, including circumferential calcifications distal to the vessel inflection point, but no flow-limiting major-vessel stenosis. Renals: There are calcific plaques, which continue to the renal hila but no flow-limiting or greater than 50% major vessel stenosis is seen. Both renal arteries are single. IMA: There are circumferential vessel calcifications. There is high-grade vessel origin stenosis but no other visible focal stenosis. Inflow: There are moderate calcifications in the common iliac arteries but no hemodynamically significant or greater than 50% stenosis is seen. There are circumferential calcifications in the internal iliac arteries but no flow-limiting stenosis. There is mild scattered calcification in the external iliac arteries without significant stenosis. Proximal Outflow: There are calcifications in the common femoral and proximal superficial femoral arteries and of the visualized deep femoral arteries, without flow-limiting stenosis. Veins: Patent.  The main portal vein is normal in caliber. Review of the MIP images confirms the above findings. NON-VASCULAR Lower chest: There is mild cardiomegaly. There is calcification in the right coronary artery. The intraventricular blood pool is low in density on the noncontrast scan consistent with anemia. Lung bases are clear. Right pleural effusion noted previously has cleared. Hepatobiliary: The liver is 18 cm length mildly steatotic with scattered calcified granulomas. There is no mass enhancement. Some images suggest capsular lobulation which could be due to early cirrhosis.  There are stones in the gallbladder but no wall thickening or biliary prominence. Pancreas: Moderately atrophic without inflammatory changes, mass enhancement or ductal dilatation. Spleen: Mildly enlarged, with multiple calcified granulomas, unchanged in size at 15 cm AP. No mass is seen. Adrenals/Urinary Tract: There  is no adrenal mass. There is mild bilateral renal volume loss likely vascular etiology. There is no mass enhancement. There are small right parapelvic cysts which are unchanged with no follow-up required. Extensive renovascular calcifications extend to the hila. There is no urinary stone or obstruction. There has no bladder thickening. There previously was air in the bladder which is not seen today. Stomach/Bowel: There is increased antral gastric fold thickening without inflammatory changes or penetrating ulcer. The unopacified small bowel is unremarkable and there is a normal appendix. There is moderate stool retention in the ascending and transverse colon. There are colonic diverticula most advanced in the sigmoid segment. There is wall thickening and stranding along the distal sigmoid colon and rectum consistent with proctocolitis, but no arterial or venous contrast leakage is seen in the lumen. Lymphatic: No adenopathy is seen. Reproductive: Uterus and bilateral adnexa are unremarkable. Other: There is mild body wall anasarca but less pronounced than on 18-Apr-2022. There is minimal ascites in the posterior deep pelvis but the abdominal ascites noted previously has cleared. There is no incarcerated hernia. No bowel pneumatosis is seen and no free air or free hemorrhage. Musculoskeletal: Advanced marginal osteophytosis thoracic and lumbar spine. 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 Mild osteopenia. IMPRESSION: 1. No evidence of arterial or venous contrast leakage into the bowel lumen. 2. Extensive vascular calcifications, particularly the small intraorgan parenchymal  branch arteries including renovascular. 3. 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 4. Major branch arteries otherwise without flow-limiting (or greater than 50%) stenoses. 5. Cardiomegaly with calcific CAD. 6. Anemia. 7. Proctocolitis involving the distal sigmoid colon and rectum, likely infectious or inflammatory. No pneumatosis or portal venous gas. Ischemic etiology not strictly excluded. 8. Diverticulosis without evidence of diverticulitis.  Constipation. 9. Mild hepatic steatosis with possible early cirrhosis. Mild splenomegaly. 10. Cholelithiasis. 11. Mild body wall anasarca but less pronounced than on 04/18/22. 12. Minimal ascites in the posterior deep pelvis. 13. Osteopenia and degenerative change. 14. Increased antral gastric fold thickening which could be due to nondistention or gastritis. 15. Aortic atherosclerosis. Aortic Atherosclerosis (ICD10-I70.0). Electronically Signed   By: Telford Nab M.D.   On: 05/23/2022 01:47    Procedures .Critical Care  Performed by: Garald Balding, PA-C Authorized by: Garald Balding, PA-C   Critical care provider statement:    Critical care time (minutes):  30   Critical care was necessary to treat or prevent imminent or life-threatening deterioration of the following conditions:  Circulatory failure and shock   Critical care was time spent personally by me on the following activities:  Development of treatment plan with patient or surrogate, discussions with consultants, evaluation of patient's response to treatment, examination of patient, ordering and review of laboratory studies, ordering and review of radiographic studies, ordering and performing treatments and interventions, pulse oximetry, re-evaluation of patient's condition and review of old charts     Medications Ordered in ED Medications  0.9 %  sodium chloride infusion (Manually program via Guardrails IV Fluids) (has no administration in time range)   0.9 %  sodium chloride infusion (Manually program via Guardrails IV Fluids) (has no administration in time range)  0.9 %  sodium chloride infusion (Manually program via Guardrails IV Fluids) (has no administration in time range)  prothrombin complex conc human (KCENTRA) IVPB 4,760 Units (0 Units Intravenous Stopped 05/23/22 0024)  hydrOXYzine (ATARAX) tablet 10 mg (10 mg Oral Given 05/23/22 0002)  iohexol (OMNIPAQUE) 350 MG/ML  injection 100 mL (100 mLs Intravenous Contrast Given 05/23/22 0059)  acetaminophen (TYLENOL) tablet 650 mg (650 mg Oral Given 05/23/22 0145)    ED Course/ Medical Decision Making/ A&P Clinical Course as of 05/23/22 0416  Thu May 22, 2022  2337 Pt's blood readings reading low (MAP in 50s/60s). She's mentating well, not pale appearing. Pt has ESRD. Cuff on right lower arm, likely non-reliable. IV in right upper arm, left arm restricted. RLE amputee. Awaiting blood from blood bank.  BP on right upper arm improved  [MB]  5035 CT tech reached out in regards to her GFR/renal function. On HD for several months. In the setting of active GI bleed, hypotension, and reversal of anticoagulation, benefit outweighs risk of contrast administration. Will proceed forward with CTA imaging  [MB]  Fri May 23, 2022  0330 Discussed case with Dr. Tarri Glenn, if she continues to have bleeding or worsening to hemodynamics, recommend repeat CTA. Dr.Mann/Hung paged but no one on call for them on schedule. [MB]  4656 Critical care paged. They will evaluate the patient  [MB]  0403 Critical care evaluated, will admit to ICU. Continuing to have large bloody bowel movements with clots and straight blood. Will repeat CTA  [MB]    Clinical Course User Index [MB] Garald Balding, PA-C                           Medical Decision Making Amount and/or Complexity of Data Reviewed Labs: ordered. Radiology: ordered.  Risk OTC drugs. Prescription drug management.   INITIAL IMPRESSION / ASSESSMENT AND PLAN  / ED COURSE   Pertinent labs & imaging results that were available during my care of the patient were reviewed by me and considered in my medical decision making (see chart for details).   This patient is Presenting for Evaluation of GI bleed, which does require a range of treatment options, and is a complaint that involves a high risk of morbidity and mortality.   The Differential Diagnoses includes but is not exclusive to GI bleed, mass, diverticular bleed, arterial bleed     I did obtain Additional Historical Information from chart review, patient -EGD in January 2022 with mildly severe candidiasis esophagitis with no bleeding. Cells for cytology obtained. Gastritis. Biopsied. -Colonoscopy in 2020 with colonic polyps, diverticulosis   I decided to review pertinent External Data   Clinical Laboratory Tests ordered, reviewed, and interpreted by me  - CBC with a hemoglobin of 9 but patient is an ESRD patient and this may not be reliable   Radiologic Tests Ordered,  I independently interpreted the images and agree with radiology interpretation.  - CTA negative for acute bleed. Proctocolitis noted.     Consult complete with Dr. Tarri Glenn with GI and critical care. See ED course for more detailed documentation of these conversations    Medical Decision Making: Summary:  80 female presenting with acute GI bleed.  She did arrive hypotensive however cuff was on the right lower arm, but BP improved in the right upper arm.  She is mentating well.  Her hemoglobin on arrival was 9, however in the setting of ESRD unclear how reliable this is.  She did have frank blood in her brief on arrival which progressively got worse and started to have large clots. She received KCENTRA for reversal of Eliquis.  Her CTA was negative for any acute bleed. She received Atarax for anxiety and Tylenol for pain.  I discussed the case with  Dr. Tarri Glenn who recommended repeat CTA for bleeding got worse her hemodynamics  changed.  Given the concern for active blood product resuscitation in the setting of large GI bleed, critical care consulted, will admit to the ICU.    Disposition: Admit to ICU   Case discussed with Dr. Pearline Cables and Dr. Laverta Baltimore, who agree with the above plan and disposition  Final Clinical Impression(s) / ED Diagnoses Final diagnoses:  Acute GI bleeding    Rx / DC Orders ED Discharge Orders     None         Garald Balding, PA-C 21/11/55 2080    Lianne Cure, DO 22/33/61 1211

## 2022-05-22 NOTE — ED Triage Notes (Signed)
Pt bib g[facility]ms from [facility] for dark red blood ins tools for the past 3 days. Pt cbg at facility was 569 - 15 units novolog given at 2100. On eliquis for afib.   BP 106/38, HR 96, Spo2 100% 3L [place] (home o2)

## 2022-05-22 NOTE — ED Provider Notes (Signed)
CT tech reached out in regards to her GFR/renal function. On HD for several months. In the setting of active GI bleed, hypotension, and reversal of anticoagulation, benefit outweighs risk of contrast administration. Will proceed forward with CTA imaging    Garald Balding, PA-C 82/51/89 8421    Gray, [place], DO 08/05/79 [phone]

## 2022-05-22 NOTE — ED Notes (Signed)
Antibodies found in patients blood. Approx another hour before blood will be ready

## 2022-05-22 NOTE — ED Notes (Signed)
Assumed care of patient.

## 2022-05-22 NOTE — ED Notes (Signed)
Patient's daughter at bedside.

## 2022-05-22 NOTE — Progress Notes (Deleted)
POST OPERATIVE OFFICE NOTE    CC:  F/u for surgery  HPI:  This is a 71 y.o. female who is s/p I&D left arm AVF with drain placement on 05/05/2022 by Dr. Carlis Abbott for infected seroma left arm AVF.  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 Trula Slade.  She was noted to have infection and indicated for I&D.   Her JP drain was removed on POD 2 and her cultures grew Serratia.  She was discharged on Bactrim for total of 14 days.   Pt states she does *** have pain/numbness in the *** hand.    The pt *** on dialysis *** at *** location.   Allergies  Allergen Reactions   Keflex [Cephalexin] Diarrhea   Codeine Nausea And Vomiting and Other (See Comments)    Current Outpatient Medications  Medication Sig Dispense Refill   albuterol (VENTOLIN HFA) 108 (90 Base) MCG/ACT inhaler Inhale 1-2 puffs into the lungs every 6 (six) hours as needed for wheezing or shortness of breath.     amiodarone (PACERONE) 200 MG tablet Take 1 tablet (200 mg total) by mouth daily. 90 tablet 3   apixaban (ELIQUIS) 5 MG TABS tablet Take 1 tablet (5 mg total) by mouth 2 (two) times daily. 180 tablet 3   aspirin EC 81 MG tablet Take 1 tablet (81 mg total) by mouth daily with breakfast. 30 tablet 11   B Complex-C-Folic Acid (DIALYVITE TABLET) TABS Take 1 tablet by mouth daily.     blood glucose meter kit and supplies Use up to four times daily as directed. 1 each 0   Continuous Blood Gluc Sensor (FREESTYLE LIBRE 2 SENSOR) MISC      diphenoxylate-atropine (LOMOTIL) 2.5-0.025 MG tablet Take 1 tablet by mouth 4 (four) times daily as needed for diarrhea or loose stools. 30 tablet 0   ferrous sulfate 325 (65 FE) MG tablet Take 325 mg by mouth 2 (two) times daily.     fidaxomicin (DIFICID) 200 MG TABS tablet Take 1 tablet (200 mg total) by mouth 2 (two) times daily. 20 tablet 0   Fingerstix Lancets MISC Use as directed to check blood sugars as need up to 4 times daily 100 each 0   hydrALAZINE (APRESOLINE) 25  MG tablet Take 1 tablet (25 mg total) by mouth every 6 (six) hours as needed (SBP>150 or DBP>100). 90 tablet 1   Insulin Pen Needle 32G X 4 MM MISC Use as directed 200 each 0   insulin regular (NOVOLIN R RELION) 100 units/mL injection Inject into the skin See admin instructions. Via Insulin Pump     levothyroxine (SYNTHROID, LEVOTHROID) 50 MCG tablet Take 1 tablet (50 mcg total) by mouth daily before breakfast. 90 tablet 1   linagliptin (TRADJENTA) 5 MG TABS tablet Take 1 tablet (5 mg total) by mouth daily. 30 tablet 0   magnesium oxide (MAG-OX) 400 (240 Mg) MG tablet Take 1 tablet (400 mg total) by mouth daily. 90 tablet 3   oxyCODONE-acetaminophen (PERCOCET/ROXICET) 5-325 MG tablet Take 1 tablet by mouth every 4 (four) hours as needed. 30 tablet 0   OXYGEN Inhale 2 L/min into the lungs at bedtime.     pregabalin (LYRICA) 75 MG capsule Take 1 capsule (75 mg total) by mouth daily. 30 capsule 0   sevelamer carbonate (RENVELA) 800 MG tablet Take 1,600 mg by mouth 3 (three) times daily with meals.     vortioxetine HBr (TRINTELLIX) 10 MG TABS tablet Take 2 tablets (20  mg total) by mouth daily. 180 tablet 3   No current facility-administered medications for this visit.     ROS:  See HPI  Physical Exam:  ***  Incision:  *** Extremities:   There *** a palpable *** pulse.   Motor and sensory *** in tact.   There *** a thrill/bruit present.  Access is *** easily palpable    Assessment/Plan:  This is a 71 y.o. female who is s/p: I&D left arm AVF with drain placement on 05/05/2022 by Dr. Carlis Abbott for infected seroma left arm AVF.  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 Trula Slade.    -the pt does *** have evidence of steal. -pt's access can be used ***. -if pt has tunneled catheter, this can be removed at the discretion of the dialysis center once the pt's access has been successfully cannulated to their satisfaction.  -discussed with pt that access does not last  forever and will need intervention or even new access at some point.  -the pt will follow up ***   Leontine Locket, [facility] [phone]  Clinic MD:  Carlis Abbott

## 2022-05-23 ENCOUNTER — Telehealth: Payer: Self-pay

## 2022-05-23 ENCOUNTER — Encounter (HOSPITAL_COMMUNITY)
Admission: EM | Disposition: A | Discharge status: Skilled Nursing Facility | Payer: Self-pay | Source: Home / Self Care | Attending: Internal Medicine

## 2022-05-23 ENCOUNTER — Emergency Department (HOSPITAL_COMMUNITY): Payer: PPO

## 2022-05-23 ENCOUNTER — Inpatient Hospital Stay (HOSPITAL_COMMUNITY): Payer: PPO

## 2022-05-23 ENCOUNTER — Inpatient Hospital Stay (HOSPITAL_COMMUNITY): Payer: PPO | Admitting: Anesthesiology

## 2022-05-23 ENCOUNTER — Encounter (HOSPITAL_COMMUNITY): Payer: Self-pay | Admitting: Internal Medicine

## 2022-05-23 DIAGNOSIS — Z794 Long term (current) use of insulin: Secondary | ICD-10-CM | POA: Diagnosis not present

## 2022-05-23 DIAGNOSIS — D638 Anemia in other chronic diseases classified elsewhere: Secondary | ICD-10-CM

## 2022-05-23 DIAGNOSIS — R0602 Shortness of breath: Secondary | ICD-10-CM | POA: Diagnosis not present

## 2022-05-23 DIAGNOSIS — D62 Acute posthemorrhagic anemia: Secondary | ICD-10-CM | POA: Diagnosis not present

## 2022-05-23 DIAGNOSIS — I5022 Chronic systolic (congestive) heart failure: Secondary | ICD-10-CM | POA: Diagnosis not present

## 2022-05-23 DIAGNOSIS — N250 Renal osteodystrophy: Secondary | ICD-10-CM | POA: Diagnosis not present

## 2022-05-23 DIAGNOSIS — Z1152 Encounter for screening for COVID-19: Secondary | ICD-10-CM | POA: Diagnosis not present

## 2022-05-23 DIAGNOSIS — I5042 Chronic combined systolic (congestive) and diastolic (congestive) heart failure: Secondary | ICD-10-CM | POA: Diagnosis not present

## 2022-05-23 DIAGNOSIS — E039 Hypothyroidism, unspecified: Secondary | ICD-10-CM

## 2022-05-23 DIAGNOSIS — I120 Hypertensive chronic kidney disease with stage 5 chronic kidney disease or end stage renal disease: Secondary | ICD-10-CM | POA: Diagnosis not present

## 2022-05-23 DIAGNOSIS — D630 Anemia in neoplastic disease: Secondary | ICD-10-CM | POA: Diagnosis not present

## 2022-05-23 DIAGNOSIS — I739 Peripheral vascular disease, unspecified: Secondary | ICD-10-CM | POA: Diagnosis not present

## 2022-05-23 DIAGNOSIS — E1310 Other specified diabetes mellitus with ketoacidosis without coma: Secondary | ICD-10-CM | POA: Diagnosis not present

## 2022-05-23 DIAGNOSIS — R2689 Other abnormalities of gait and mobility: Secondary | ICD-10-CM | POA: Diagnosis not present

## 2022-05-23 DIAGNOSIS — E1151 Type 2 diabetes mellitus with diabetic peripheral angiopathy without gangrene: Secondary | ICD-10-CM | POA: Diagnosis not present

## 2022-05-23 DIAGNOSIS — K922 Gastrointestinal hemorrhage, unspecified: Secondary | ICD-10-CM | POA: Diagnosis present

## 2022-05-23 DIAGNOSIS — E669 Obesity, unspecified: Secondary | ICD-10-CM | POA: Diagnosis present

## 2022-05-23 DIAGNOSIS — E1122 Type 2 diabetes mellitus with diabetic chronic kidney disease: Secondary | ICD-10-CM | POA: Diagnosis present

## 2022-05-23 DIAGNOSIS — M6281 Muscle weakness (generalized): Secondary | ICD-10-CM | POA: Diagnosis not present

## 2022-05-23 DIAGNOSIS — A0471 Enterocolitis due to Clostridium difficile, recurrent: Secondary | ICD-10-CM | POA: Diagnosis not present

## 2022-05-23 DIAGNOSIS — E1142 Type 2 diabetes mellitus with diabetic polyneuropathy: Secondary | ICD-10-CM | POA: Diagnosis not present

## 2022-05-23 DIAGNOSIS — Z992 Dependence on renal dialysis: Secondary | ICD-10-CM | POA: Diagnosis not present

## 2022-05-23 DIAGNOSIS — R2681 Unsteadiness on feet: Secondary | ICD-10-CM | POA: Diagnosis not present

## 2022-05-23 DIAGNOSIS — K6289 Other specified diseases of anus and rectum: Secondary | ICD-10-CM | POA: Diagnosis not present

## 2022-05-23 DIAGNOSIS — Z79899 Other long term (current) drug therapy: Secondary | ICD-10-CM | POA: Diagnosis not present

## 2022-05-23 DIAGNOSIS — I959 Hypotension, unspecified: Secondary | ICD-10-CM | POA: Diagnosis not present

## 2022-05-23 DIAGNOSIS — Z89511 Acquired absence of right leg below knee: Secondary | ICD-10-CM | POA: Diagnosis not present

## 2022-05-23 DIAGNOSIS — K626 Ulcer of anus and rectum: Secondary | ICD-10-CM

## 2022-05-23 DIAGNOSIS — R109 Unspecified abdominal pain: Secondary | ICD-10-CM | POA: Diagnosis not present

## 2022-05-23 DIAGNOSIS — Z789 Other specified health status: Secondary | ICD-10-CM

## 2022-05-23 DIAGNOSIS — Z66 Do not resuscitate: Secondary | ICD-10-CM | POA: Diagnosis not present

## 2022-05-23 DIAGNOSIS — R58 Hemorrhage, not elsewhere classified: Secondary | ICD-10-CM | POA: Diagnosis not present

## 2022-05-23 DIAGNOSIS — J9611 Chronic respiratory failure with hypoxia: Secondary | ICD-10-CM | POA: Diagnosis not present

## 2022-05-23 DIAGNOSIS — Z743 Need for continuous supervision: Secondary | ICD-10-CM | POA: Diagnosis not present

## 2022-05-23 DIAGNOSIS — N186 End stage renal disease: Secondary | ICD-10-CM | POA: Diagnosis not present

## 2022-05-23 DIAGNOSIS — I132 Hypertensive heart and chronic kidney disease with heart failure and with stage 5 chronic kidney disease, or end stage renal disease: Secondary | ICD-10-CM | POA: Diagnosis not present

## 2022-05-23 DIAGNOSIS — R279 Unspecified lack of coordination: Secondary | ICD-10-CM | POA: Diagnosis not present

## 2022-05-23 DIAGNOSIS — F32A Depression, unspecified: Secondary | ICD-10-CM | POA: Diagnosis present

## 2022-05-23 DIAGNOSIS — I5020 Unspecified systolic (congestive) heart failure: Secondary | ICD-10-CM | POA: Diagnosis not present

## 2022-05-23 DIAGNOSIS — I480 Paroxysmal atrial fibrillation: Secondary | ICD-10-CM | POA: Diagnosis not present

## 2022-05-23 DIAGNOSIS — R578 Other shock: Secondary | ICD-10-CM | POA: Diagnosis not present

## 2022-05-23 DIAGNOSIS — D6832 Hemorrhagic disorder due to extrinsic circulating anticoagulants: Secondary | ICD-10-CM | POA: Diagnosis not present

## 2022-05-23 DIAGNOSIS — R41841 Cognitive communication deficit: Secondary | ICD-10-CM | POA: Diagnosis not present

## 2022-05-23 DIAGNOSIS — I950 Idiopathic hypotension: Secondary | ICD-10-CM | POA: Diagnosis not present

## 2022-05-23 DIAGNOSIS — R197 Diarrhea, unspecified: Secondary | ICD-10-CM | POA: Diagnosis not present

## 2022-05-23 DIAGNOSIS — D631 Anemia in chronic kidney disease: Secondary | ICD-10-CM | POA: Diagnosis not present

## 2022-05-23 DIAGNOSIS — E1165 Type 2 diabetes mellitus with hyperglycemia: Secondary | ICD-10-CM | POA: Diagnosis not present

## 2022-05-23 DIAGNOSIS — E785 Hyperlipidemia, unspecified: Secondary | ICD-10-CM | POA: Diagnosis present

## 2022-05-23 DIAGNOSIS — Z87891 Personal history of nicotine dependence: Secondary | ICD-10-CM | POA: Diagnosis not present

## 2022-05-23 DIAGNOSIS — R739 Hyperglycemia, unspecified: Secondary | ICD-10-CM | POA: Diagnosis not present

## 2022-05-23 HISTORY — PX: HEMOSTASIS CLIP PLACEMENT: SHX6857

## 2022-05-23 HISTORY — PX: FLEXIBLE SIGMOIDOSCOPY: SHX5431

## 2022-05-23 HISTORY — PX: HOT HEMOSTASIS: SHX5433

## 2022-05-23 LAB — CBC WITH DIFFERENTIAL/PLATELET
Abs Immature Granulocytes: 0.11 10*3/uL — ABNORMAL HIGH (ref 0.00–0.07)
Basophils Absolute: 0 10*3/uL (ref 0.0–0.1)
Basophils Relative: 0 %
Eosinophils Absolute: 0.3 10*3/uL (ref 0.0–0.5)
Eosinophils Relative: 3 %
HCT: 25.9 % — ABNORMAL LOW (ref 36.0–46.0)
Hemoglobin: 7.8 g/dL — ABNORMAL LOW (ref 12.0–15.0)
Immature Granulocytes: 1 %
Lymphocytes Relative: 8 %
Lymphs Abs: 0.8 10*3/uL (ref 0.7–4.0)
MCH: 25.7 pg — ABNORMAL LOW (ref 26.0–34.0)
MCHC: 30.1 g/dL (ref 30.0–36.0)
MCV: 85.5 fL (ref 80.0–100.0)
Monocytes Absolute: 0.8 10*3/uL (ref 0.1–1.0)
Monocytes Relative: 8 %
Neutro Abs: 7.8 10*3/uL — ABNORMAL HIGH (ref 1.7–7.7)
Neutrophils Relative %: 80 %
Platelets: 258 10*3/uL (ref 150–400)
RBC: 3.03 MIL/uL — ABNORMAL LOW (ref 3.87–5.11)
RDW: 19.1 % — ABNORMAL HIGH (ref 11.5–15.5)
WBC: 9.8 10*3/uL (ref 4.0–10.5)
nRBC: 0 % (ref 0.0–0.2)

## 2022-05-23 LAB — CBC
HCT: 26.9 % — ABNORMAL LOW (ref 36.0–46.0)
Hemoglobin: 8.8 g/dL — ABNORMAL LOW (ref 12.0–15.0)
MCH: 27.1 pg (ref 26.0–34.0)
MCHC: 32.7 g/dL (ref 30.0–36.0)
MCV: 82.8 fL (ref 80.0–100.0)
Platelets: 294 10*3/uL (ref 150–400)
RBC: 3.25 MIL/uL — ABNORMAL LOW (ref 3.87–5.11)
RDW: 17.7 % — ABNORMAL HIGH (ref 11.5–15.5)
WBC: 13.3 10*3/uL — ABNORMAL HIGH (ref 4.0–10.5)
nRBC: 0 % (ref 0.0–0.2)

## 2022-05-23 LAB — LACTIC ACID, PLASMA: Lactic Acid, Venous: 1.5 mmol/L (ref 0.5–1.9)

## 2022-05-23 LAB — GLUCOSE, CAPILLARY
Glucose-Capillary: 148 mg/dL — ABNORMAL HIGH (ref 70–99)
Glucose-Capillary: 234 mg/dL — ABNORMAL HIGH (ref 70–99)
Glucose-Capillary: 458 mg/dL — ABNORMAL HIGH (ref 70–99)
Glucose-Capillary: 486 mg/dL — ABNORMAL HIGH (ref 70–99)

## 2022-05-23 LAB — HEMOGLOBIN AND HEMATOCRIT, BLOOD
HCT: 26.1 % — ABNORMAL LOW (ref 36.0–46.0)
Hemoglobin: 8.2 g/dL — ABNORMAL LOW (ref 12.0–15.0)

## 2022-05-23 LAB — CBG MONITORING, ED
Glucose-Capillary: 426 mg/dL — ABNORMAL HIGH (ref 70–99)
Glucose-Capillary: 425 mg/dL — ABNORMAL HIGH (ref 70–99)
Glucose-Capillary: 397 mg/dL — ABNORMAL HIGH (ref 70–99)
Glucose-Capillary: 414 mg/dL — ABNORMAL HIGH (ref 70–99)
Glucose-Capillary: 341 mg/dL — ABNORMAL HIGH (ref 70–99)

## 2022-05-23 LAB — MRSA NEXT GEN BY PCR, NASAL: MRSA by PCR Next Gen: NOT DETECTED

## 2022-05-23 LAB — PREPARE RBC (CROSSMATCH)

## 2022-05-23 SURGERY — SIGMOIDOSCOPY, FLEXIBLE
Anesthesia: Monitor Anesthesia Care

## 2022-05-23 MED ORDER — ORAL CARE MOUTH RINSE: 15.0000 mL | OROMUCOSAL | Status: DC | PRN

## 2022-05-23 MED ORDER — ACETAMINOPHEN 325 MG PO TABS
650.0000 mg | ORAL_TABLET | Freq: Once | ORAL | Status: AC
  Administered 2022-05-23: 650 mg via ORAL
  Filled 2022-05-23: qty 2

## 2022-05-23 MED ORDER — SODIUM CHLORIDE 0.9 % IV SOLN: INTRAVENOUS | Status: DC

## 2022-05-23 MED ORDER — LIDOCAINE 2% (20 MG/ML) 5 ML SYRINGE
INTRAMUSCULAR | Status: DC | PRN
  Administered 2022-05-23: 60 mg via INTRAVENOUS

## 2022-05-23 MED ORDER — SODIUM CHLORIDE 0.9% IV SOLUTION: Freq: Once | INTRAVENOUS | Status: DC

## 2022-05-23 MED ORDER — CHLORHEXIDINE GLUCONATE CLOTH 2 % EX PADS
6.0000 | MEDICATED_PAD | Freq: Every day | CUTANEOUS | Status: DC
  Administered 2022-05-23 – 2022-05-29 (×7): 6 via TOPICAL

## 2022-05-23 MED ORDER — PROPOFOL 500 MG/50ML IV EMUL
INTRAVENOUS | Status: DC | PRN
  Administered 2022-05-23: 25 ug/kg/min via INTRAVENOUS

## 2022-05-23 MED ORDER — INSULIN REGULAR(HUMAN) IN NACL 100-0.9 UT/100ML-% IV SOLN
INTRAVENOUS | Status: DC
  Administered 2022-05-23: 11.5 [IU]/h via INTRAVENOUS
  Filled 2022-05-23: qty 100

## 2022-05-23 MED ORDER — DOCUSATE SODIUM 100 MG PO CAPS: 100.0000 mg | ORAL_CAPSULE | Freq: Two times a day (BID) | ORAL | Status: DC | PRN

## 2022-05-23 MED ORDER — POLYETHYLENE GLYCOL 3350 17 G PO PACK: 17.0000 g | PACK | Freq: Every day | ORAL | Status: DC | PRN

## 2022-05-23 MED ORDER — IOHEXOL 350 MG/ML SOLN
100.0000 mL | Freq: Once | INTRAVENOUS | Status: AC | PRN
  Administered 2022-05-23: 100 mL via INTRAVENOUS

## 2022-05-23 MED ORDER — PIPERACILLIN-TAZOBACTAM 3.375 G IVPB: 3.3750 g | Freq: Three times a day (TID) | INTRAVENOUS | Status: DC

## 2022-05-23 MED ORDER — INSULIN ASPART 100 UNIT/ML IJ SOLN
0.0000 [IU] | INTRAMUSCULAR | Status: DC
  Administered 2022-05-23: 5 [IU] via SUBCUTANEOUS
  Administered 2022-05-23: 8 [IU] via SUBCUTANEOUS
  Administered 2022-05-24: 2 [IU] via SUBCUTANEOUS
  Administered 2022-05-24: 5 [IU] via SUBCUTANEOUS
  Administered 2022-05-24 (×4): 3 [IU] via SUBCUTANEOUS
  Administered 2022-05-25: 8 [IU] via SUBCUTANEOUS
  Administered 2022-05-25: 3 [IU] via SUBCUTANEOUS
  Administered 2022-05-25: 5 [IU] via SUBCUTANEOUS
  Administered 2022-05-25 (×2): 3 [IU] via SUBCUTANEOUS
  Administered 2022-05-25: 5 [IU] via SUBCUTANEOUS
  Administered 2022-05-26 (×2): 2 [IU] via SUBCUTANEOUS
  Administered 2022-05-26 (×2): 3 [IU] via SUBCUTANEOUS

## 2022-05-23 MED ORDER — PIPERACILLIN-TAZOBACTAM IN DEX 2-0.25 GM/50ML IV SOLN
2.2500 g | Freq: Three times a day (TID) | INTRAVENOUS | Status: DC
  Administered 2022-05-23 – 2022-05-24 (×3): 2.25 g via INTRAVENOUS
  Filled 2022-05-23 (×6): qty 50

## 2022-05-23 MED ORDER — INSULIN ASPART 100 UNIT/ML IJ SOLN
15.0000 [IU] | Freq: Once | INTRAMUSCULAR | Status: AC
  Administered 2022-05-23: 15 [IU] via SUBCUTANEOUS
  Filled 2022-05-23: qty 0.15

## 2022-05-23 MED ORDER — NOREPINEPHRINE 4 MG/250ML-% IV SOLN
0.0000 ug/min | INTRAVENOUS | Status: DC
  Administered 2022-05-23: 2 ug/min via INTRAVENOUS
  Administered 2022-05-24: 1.5 ug/min via INTRAVENOUS
  Filled 2022-05-23 (×2): qty 250

## 2022-05-23 MED ORDER — DEXTROSE 50 % IV SOLN: 0.0000 mL | INTRAVENOUS | Status: DC | PRN

## 2022-05-23 MED ORDER — VANCOMYCIN HCL 125 MG PO CAPS
125.0000 mg | ORAL_CAPSULE | Freq: Four times a day (QID) | ORAL | Status: DC
  Administered 2022-05-23 – 2022-05-25 (×7): 125 mg via ORAL
  Filled 2022-05-23 (×9): qty 1

## 2022-05-23 MED ORDER — PANTOPRAZOLE SODIUM 40 MG IV SOLR
40.0000 mg | Freq: Two times a day (BID) | INTRAVENOUS | Status: DC
  Administered 2022-05-23 – 2022-05-28 (×11): 40 mg via INTRAVENOUS
  Filled 2022-05-23 (×12): qty 10

## 2022-05-23 NOTE — ED Notes (Signed)
Insulin discontinued and stopped by [facility] RN at the bedside. Per [facility] RN ICU nurse on the unit made aware

## 2022-05-23 NOTE — H&P (Signed)
NAME:  Susan [name], MRN:  [id number], DOB:  29-Nov-1950, LOS: 0 ADMISSION DATE:  05/22/2022, CONSULTATION DATE: 05/23/2022 REFERRING MD:  Garald Balding, PA-C , CHIEF COMPLAINT: Blood per rectum  History of Present Illness:  71 year old female with end-stage renal disease on hemodialysis, coronary artery disease, paroxysmal A-fib on Eliquis, diabetes and hypertension was brought in the emergency department with complaint of abdominal pain and diarrhea for last few days.  Patient stated she was in her usual state of health but few days ago she started with dark bloody bowel movements initially it was 2-3 times a day but yesterday it happened 6-7 times and she felt weak so was brought to the emergency department.  Initially she was found to be hypotensive, requiring IV fluid with improvement in blood pressure, in the emergency department she had multiple large bowel movement containing clots.  GI was consulted they recommend CT angiogram abdomen and pelvis, initial CT angio was negative.  Patient is compliant with her Eliquis, she received Kcentra for anticoagulation reversal, as she was actively bleeding, PCCM was consulted for help evaluation and ICU admission Patient stated abdominal pain is better denies nausea, vomiting, fever or chills  Pertinent  Medical History   Past Medical History:  Diagnosis Date   Acute MI ([place]) 1999; 2007; 03/05/21   Anemia    hx   Anginal pain ([place])    Anxiety    ARF (acute renal failure) ([place]) 06/2017   [facility]   Arthritis    "generalized" (03/15/2014)   CAD (coronary artery disease)    MI in 2000 - MI  2007 - treated bare metal stent (no nuclear since then as 9/11)   Carotid artery disease (HCC)    CHF (congestive heart failure) ([place])    HFrEF 03/06/21   Chronic diastolic heart failure ([place])    a) ECHO (08/2013) EF 55-60% and RV function nl b) RHC (08/2013) RA 4, RV 30/5/7, PA 25/10 (16), PCWP 7, Fick CO/CI 6.3/2.7, PVR 1.5 WU, PA 61 and 66%    Daily headache    "~ every other day; since I fell in June" (03/15/2014)   Depression    Dyslipidemia    Dyspnea    uses oxygen qhs via [place]   ESRD (end stage renal disease) ([place])    Dialysis on Tues Thurs Sat   Exertional shortness of breath    History of kidney stones    HTN (hypertension)    Hypothyroidism    Neuropathy    Obesity    Osteoarthritis    PAF (paroxysmal atrial fibrillation) (HCC)    Peripheral neuropathy    bilateral feet/hands   PONV (postoperative nausea and vomiting)    RBBB (right bundle branch block)    Old   Stroke ([place])    mini strokes   Syncope    likely due to low blood sugar   Tachycardia    Sinus tachycardia   Type II diabetes mellitus (HCC)    Type II, Lamar Sprinkles libre left upper arm. patient has omnipod insulin pump with Novolin R Insulin   Urinary incontinence    Venous insufficiency      Significant Hospital Events: Including procedures, antibiotic start and stop dates in addition to other pertinent events     Interim History / Subjective:    Objective   Blood pressure 100/62, pulse 85, temperature 98.3 F (36.8 C), temperature source Oral, resp. rate 15, weight 98 kg, SpO2 100 %.  No intake or output data in the 24 hours ending 05/23/22 0421 Filed Weights   05/22/22 2215  Weight: 98 kg    Examination: Physical exam: General: Acute on chronically ill-appearing morbidly obese female, lying on the bed HEENT: [place]/AT, eyes anicteric.  moist mucus membranes Neuro: Alert, awake following commands Chest: Coarse breath sounds, no wheezes or rhonchi Heart: Regular rate and rhythm, no murmurs or gallops Abdomen: Soft, tender in left lower quadrant, bowel sounds present Skin: No rash   Resolved Hospital Problem list     Assessment & Plan:  Acute lower GI bleeding Acute on chronic blood loss anemia Probable infectious proctocolitis Coronary artery disease Chronic biventricular systolic heart failure Paroxysmal A-fib on  anticoagulation with Eliquis Poorly controlled diabetes type 2 with hyperglycemia End-stage renal disease on hemodialysis Morbid obesity  Patient is actively bleeding and passing clots CT abdomen pelvis with IV contrast showing proctocolitis with differential inflammatory/infectious versus ischemic Her lactate is 1.5 so ischemic colitis is less likely CT abdomen and pelvis did show extensive diverticulosis but no diverticulitis She is on Eliquis, which she took in less than 24 hours She received Kcentra in the emergency department Check hemoglobin every 6 hours Transfuse if hemoglobin less than 8 considering active bleeding Continue Protonix 40 mg twice daily Started on IV Zosyn for probable infectious colitis Monitor intake and output Hold anticoagulation Patient blood sugars are not well-controlled, her hemoglobin A1c was 8.9 earlier this month Started on sliding scale insulin She will be kept n.p.o. Will call nephrology in the morning for hemodialysis needs Admit to ICU considering active bleeding, high risk of clinical deterioration  Best Practice (right click and "Reselect all SmartList Selections" daily)   Diet/type: NPO DVT prophylaxis: SCD GI prophylaxis: PPI Lines: N/A Foley:  N/A Code Status:  full code Last date of multidisciplinary goals of care discussion [pending]  Labs   CBC: Recent Labs  Lab 05/22/22 2220 05/22/22 2253  WBC 9.4  --   NEUTROABS 7.6  --   HGB 9.0* 10.2*  HCT 32.1* 30.0*  MCV 85.1  --   PLT 265  --     Basic Metabolic Panel: Recent Labs  Lab 05/22/22 2220 05/22/22 2253  NA 131* 133*  K 3.6 3.6  CL 96* 94*  CO2 24  --   GLUCOSE 344* 357*  BUN 26* 28*  CREATININE 2.80* 2.70*  CALCIUM 8.6*  --    GFR: Estimated Creatinine Clearance: 21.8 mL/min (A) (by C-G formula based on SCr of 2.7 mg/dL (H)). Recent Labs  Lab 05/22/22 2220 05/23/22 0250  WBC 9.4  --   LATICACIDVEN  --  1.5    Liver Function Tests: Recent Labs  Lab  05/22/22 2220  AST 25  ALT 9  ALKPHOS 110  BILITOT <0.1*  PROT 5.9*  ALBUMIN 2.1*   No results for input(s): "LIPASE", "AMYLASE" in the last 168 hours. No results for input(s): "AMMONIA" in the last 168 hours.  ABG    Component Value Date/Time   PHART 7.412 01/15/2022 1633   PCO2ART 32.0 01/15/2022 1633   PO2ART 67 (L) 01/15/2022 1633   HCO3 20.4 01/15/2022 1633   TCO2 27 05/22/2022 2253   ACIDBASEDEF 3.0 (H) 01/15/2022 1633   O2SAT 94 01/15/2022 1633     Coagulation Profile: Recent Labs  Lab 05/22/22 2220  INR 1.4*    Cardiac Enzymes: No results for input(s): "CKTOTAL", "CKMB", "CKMBINDEX", "TROPONINI" in the last 168 hours.  HbA1C: Hgb A1c MFr Bld  Date/Time Value  Ref Range Status  05/02/2022 08:51 AM 8.9 (H) 4.8 - 5.6 % Final    Comment:    (NOTE)         Prediabetes: 5.7 - 6.4         Diabetes: >6.4         Glycemic control for adults with diabetes: <7.0   01/20/2022 03:25 AM >15.5 (H) 4.8 - 5.6 % Final    Comment:    (NOTE)         Prediabetes: 5.7 - 6.4         Diabetes: >6.4         Glycemic control for adults with diabetes: <7.0     CBG: Recent Labs  Lab 05/22/22 2206  GLUCAP 342*    Review of Systems:   12 point review of systems significant for complaint mentioned HPI rest is negative  Past Medical History:  She,  has a past medical history of Acute MI ([place]) (1999; 2007; 03/05/21), Anemia, Anginal pain ([place]), Anxiety, ARF (acute renal failure) ([place]) (06/2017), Arthritis, CAD (coronary artery disease), Carotid artery disease ([place]), CHF (congestive heart failure) ([place]), Chronic diastolic heart failure ([place]), Daily headache, Depression, Dyslipidemia, Dyspnea, ESRD (end stage renal disease) ([place]), Exertional shortness of breath, History of kidney stones, HTN (hypertension), Hypothyroidism, Neuropathy, Obesity, Osteoarthritis, PAF (paroxysmal atrial fibrillation) ([place]), Peripheral neuropathy, PONV (postoperative nausea and vomiting), RBBB (right  bundle branch block), Stroke ([place]), Syncope, Tachycardia, Type II diabetes mellitus ([place]), Urinary incontinence, and Venous insufficiency.   Surgical History:   Past Surgical History:  Procedure Laterality Date   ABDOMINAL HYSTERECTOMY  1980's   AMPUTATION Right 02/24/2018   Procedure: RIGHT FOOT GREAT TOE AND 2ND TOE AMPUTATION;  Surgeon: Newt Minion, MD;  Location: [place];  Service: Orthopedics;  Laterality: Right;   AMPUTATION Right 04/30/2018   Procedure: RIGHT TRANSMETATARSAL AMPUTATION;  Surgeon: Newt Minion, MD;  Location: [place];  Service: Orthopedics;  Laterality: Right;   AMPUTATION Right 05/02/2022   Procedure: RIGHT BELOW KNEE AMPUTATION;  Surgeon: Newt Minion, MD;  Location: [facility];  Service: Orthopedics;  Laterality: Right;   AV FISTULA PLACEMENT Left 04/02/2022   Procedure: LEFT ARM ARTERIOVENOUS (AV) FISTULA CREATION;  Surgeon: Serafina Mitchell, MD;  Location: [facility];  Service: Vascular;  Laterality: Left;  PERIPHERAL NERVE BLOCK   BIOPSY  05/27/2020   Procedure: BIOPSY;  Surgeon: [name], DO;  [facility];  Service: Endoscopy;;   CATARACT EXTRACTION, BILATERAL Bilateral ?2013   COLONOSCOPY W/ POLYPECTOMY     COLONOSCOPY WITH PROPOFOL N/A 03/13/2019   Procedure: COLONOSCOPY WITH PROPOFOL;  Surgeon: Jerene Bears, MD;  Location: [facility];  Service: Gastroenterology;  Laterality: N/A;   CORONARY ANGIOPLASTY WITH STENT PLACEMENT  1999; 2007   "1 + 1"   ERCP N/A 02/03/2022   Procedure: ENDOSCOPIC RETROGRADE CHOLANGIOPANCREATOGRAPHY (ERCP);  Surgeon: Carol Ada, MD;  Location: [place];  Service: Gastroenterology;  Laterality: N/A;   ESOPHAGOGASTRODUODENOSCOPY (EGD) WITH PROPOFOL N/A 03/13/2019   Procedure: ESOPHAGOGASTRODUODENOSCOPY (EGD) WITH PROPOFOL;  Surgeon: Jerene Bears, MD;  Location: [facility] END[facility];  Service: Gastroenterology;  Laterality: N/A;   ESOPHAGOGASTRODUODENOSCOPY (EGD) WITH PROPOFOL N/A 05/27/2020   Procedure:  ESOPHAGOGASTRODUODENOSCOPY (EGD) WITH PROPOFOL;  Surgeon: [name], DO;  [facility];  Service: Endoscopy;  Laterality: N/A;   EYE SURGERY Bilateral    lazer   HEMOSTASIS CLIP PLACEMENT  03/13/2019   Procedure: HEMOSTASIS CLIP PLACEMENT;  Surgeon: Jerene Bears, MD;  Location: [facility];  Service: Gastroenterology;;   I & D EXTREMITY Left 05/05/2022   Procedure: IRRIGATION AND DEBRIDEMENT LEFT ARM AV FISTULA;  Surgeon: Marty Heck, MD;  Location: [place];  Service: Vascular;  Laterality: Left;   INSERTION OF DIALYSIS CATHETER Right 04/02/2022   Procedure: INSERTION OF TUNNELED DIALYSIS CATHETER;  Surgeon: Serafina Mitchell, MD;  Location: [facility];  Service: Vascular;  Laterality: Right;   KNEE ARTHROSCOPY Left 10/25/2006   POLYPECTOMY  03/13/2019   Procedure: POLYPECTOMY;  Surgeon: Jerene Bears, MD;  Location: [facility];  Service: Gastroenterology;;   REMOVAL OF STONES  02/03/2022   Procedure: REMOVAL OF STONES;  Surgeon: Carol Ada, MD;  Location: [place];  Service: Gastroenterology;;   RIGHT HEART CATH N/A 07/24/2017   Procedure: RIGHT HEART CATH;  Surgeon: Jolaine Artist, MD;  Location: [facility];  Service: Cardiovascular;  Laterality: N/A;   RIGHT HEART CATHETERIZATION N/A 09/22/2013   Procedure: RIGHT HEART CATH;  Surgeon: Jolaine Artist, MD;  Location: [facility] CATH LAB;  Service: Cardiovascular;  Laterality: N/A;   SHOULDER ARTHROSCOPY WITH OPEN ROTATOR CUFF REPAIR Right 03/14/2014   Procedure: RIGHT SHOULDER ARTHROSCOPY WITH BICEPS RELEASE, OPEN SUBSCAPULA REPAIR, OPEN SUPRASPINATUS REPAIR.;  Surgeon: Meredith Pel, MD;  Location: [place];  Service: Orthopedics;  Laterality: Right;   SPHINCTEROTOMY  02/03/2022   Procedure: SPHINCTEROTOMY;  Surgeon: Carol Ada, MD;  Location: [place];  Service: Gastroenterology;;   TEE WITHOUT CARDIOVERSION N/A 02/04/2022   Procedure: TRANSESOPHAGEAL ECHOCARDIOGRAM (TEE);  Surgeon: Jolaine Artist, MD;  Location: [facility] ENDOSCOPY;  Service: Cardiovascular;  Laterality: N/A;   TOE AMPUTATION Right 02/24/2018   GREAT TOE AND 2ND TOE AMPUTATION   TUBAL LIGATION  1970's     Social History:   reports that she quit smoking about 24 years ago. Her smoking use included cigarettes. She has a 96.00 pack-year smoking history. She has never used smokeless tobacco. She reports that she does not currently use alcohol. She reports that she does not use drugs.   Family History:  Her family history includes Heart attack (age of onset: 41) in her mother.   Allergies Allergies  Allergen Reactions   Keflex [Cephalexin] Diarrhea   Codeine Nausea And Vomiting and Other (See Comments)     Home Medications  Prior to Admission medications   Medication Sig Start Date End Date Taking? Authorizing Provider  albuterol (VENTOLIN HFA) 108 (90 Base) MCG/ACT inhaler Inhale 1-2 puffs into the lungs every 6 (six) hours as needed for wheezing or shortness of breath.    [provider]  amiodarone (PACERONE) 200 MG tablet Take 1 tablet (200 mg total) by mouth daily. 01/29/22   [place], Maricela Bo, FNP  apixaban (ELIQUIS) 5 MG TABS tablet Take 1 tablet (5 mg total) by mouth 2 (two) times daily. 01/29/22   Rafael Bihari, FNP  aspirin EC 81 MG tablet Take 1 tablet (81 mg total) by mouth daily with breakfast. 05/28/20   Roxan Hockey, MD  B Complex-C-Folic Acid (DIALYVITE TABLET) TABS Take 1 tablet by mouth daily. 04/22/22   [provider]  blood glucose meter kit and supplies Use up to four times daily as directed. 01/23/22   Ghimire, Henreitta Leber, MD  Continuous Blood Gluc Sensor (FREESTYLE LIBRE 2 SENSOR) MISC  01/14/22   [provider]  diphenoxylate-atropine (LOMOTIL) 2.5-0.025 MG tablet Take 1 tablet by mouth 4 (four) times daily as needed for diarrhea or loose stools. 08/19/21  [name], MD  ferrous sulfate 325 (65 FE) MG tablet Take 325 mg by mouth 2 (two) times daily.     [provider]  fidaxomicin (DIFICID) 200 MG TABS tablet Take 1 tablet (200 mg total) by mouth 2 (two) times daily. 03/28/22   Rosiland Oz, MD  Fingerstix Lancets MISC Use as directed to check blood sugars as need up to 4 times daily 01/23/22   Ghimire, Henreitta Leber, MD  hydrALAZINE (APRESOLINE) 25 MG tablet Take 1 tablet (25 mg total) by mouth every 6 (six) hours as needed (SBP>150 or DBP>100). 04/06/22   Nita Sells, MD  Insulin Pen Needle 32G X 4 MM MISC Use as directed 01/23/22   Ghimire, Henreitta Leber, MD  insulin regular (NOVOLIN R RELION) 100 units/mL injection Inject into the skin See admin instructions. Via Insulin Pump    [provider]  levothyroxine (SYNTHROID, LEVOTHROID) 50 MCG tablet Take 1 tablet (50 mcg total) by mouth daily before breakfast. 11/07/16   [name], PA-C  linagliptin (TRADJENTA) 5 MG TABS tablet Take 1 tablet (5 mg total) by mouth daily. 04/07/22   Nita Sells, MD  magnesium oxide (MAG-OX) 400 (240 Mg) MG tablet Take 1 tablet (400 mg total) by mouth daily. 07/18/21   [name], MD  oxyCODONE-acetaminophen (PERCOCET/ROXICET) 5-325 MG tablet Take 1 tablet by mouth every 4 (four) hours as needed. 05/08/22   Newt Minion, MD  OXYGEN Inhale 2 L/min into the lungs at bedtime.    [provider]  pregabalin (LYRICA) 75 MG capsule Take 1 capsule (75 mg total) by mouth daily. 04/07/22   Nita Sells, MD  sevelamer carbonate (RENVELA) 800 MG tablet Take 1,600 mg by mouth 3 (three) times daily with meals.    [provider]  vortioxetine HBr (TRINTELLIX) 10 MG TABS tablet Take 2 tablets (20 mg total) by mouth daily. 04/23/22   [name], MD     Critical care time:      This patient is critically ill with multiple organ system failure which requires frequent high complexity decision making, assessment, support, evaluation, and titration of therapies. This was completed through the application of  advanced monitoring technologies and extensive interpretation of multiple databases.  During this encounter critical care time was devoted to patient care services described in this note for 39 minutes.    Jacky Kindle, MD [facility] See Amion for pager If no response to pager, please call [phone] until 7pm After 7pm, Please call E-link [phone]

## 2022-05-23 NOTE — Progress Notes (Signed)
71 y.o. female. History of inpatient of hypothyroidism, CHF, DM, CAD, MI, a fib, ESRD on HD. Presented to the ED with abdominal pain and dark stools X 1 week.  Found to be hypotensive. With a rectal GI bleed. CT Angio abd pelvis from 12.29.23 reads There is an irregular, somewhat serpiginous contrast blush into the distal rectum from the left dorsal wall on the arterial phase with further diffusion of the leaked contrast into the rectal content on the venous phase. Findings are consistent with active GI bleeding.  Case reviewed by IR Attending Dr. Vernia Buff. After review of the images due to the extensive calcifications and location of bleed in the lower rectum. Recommend Team consult GI for scope. This was communicated directly to Dr, Charlsie Quest by IR Attending.

## 2022-05-23 NOTE — ED Notes (Signed)
Patient was incontinent to bloody stool for a 2nd time. Patient was cleaned and linens were changed.

## 2022-05-23 NOTE — H&P (Addendum)
NAME:  Susan [name], MRN:  [id number], DOB:  April 15, 1951, LOS: 0 ADMISSION DATE:  05/22/2022, CONSULTATION DATE: 05/23/2022 REFERRING MD:  Mare Ferrari, PA-C , CHIEF COMPLAINT: Blood per rectum  History of Present Illness:  71 year old female with end-stage renal disease on hemodialysis, coronary artery disease, paroxysmal A-fib on Eliquis, diabetes and hypertension was brought in the emergency department with complaint of abdominal pain and diarrhea for last few days.  Patient stated she was in her usual state of health but few days ago she started with dark bloody bowel movements initially it was 2-3 times a day but yesterday it happened 6-7 times and she felt weak so was brought to the emergency department.  Initially she was found to be hypotensive, requiring IV fluid with improvement in blood pressure, in the emergency department she had multiple large bowel movement containing clots.  GI was consulted they recommend CT angiogram abdomen and pelvis, initial CT angio was negative.  Patient is compliant with her Eliquis, she received Kcentra for anticoagulation reversal, as she was actively bleeding, PCCM was consulted for help evaluation and ICU admission Patient stated abdominal pain is better denies nausea, vomiting, fever or chills  Pertinent  Medical History   Past Medical History:  Diagnosis Date   Acute MI (HCC) 1999; 2007; 03/05/21   Anemia    hx   Anginal pain (HCC)    Anxiety    ARF (acute renal failure) (HCC) 06/2017   [facility]   Arthritis    "generalized" (03/15/2014)   CAD (coronary artery disease)    MI in 2000 - MI  2007 - treated bare metal stent (no nuclear since then as 9/11)   Carotid artery disease (HCC)    CHF (congestive heart failure) (HCC)    HFrEF 03/06/21   Chronic diastolic heart failure (HCC)    a) ECHO (08/2013) EF 55-60% and RV function nl b) RHC (08/2013) RA 4, RV 30/5/7, PA 25/10 (16), PCWP 7, Fick CO/CI 6.3/2.7, PVR 1.5 WU, PA 61 and 66%    Daily headache    "~ every other day; since I fell in June" (03/15/2014)   Depression    Dyslipidemia    Dyspnea    uses oxygen qhs via [place]   ESRD (end stage renal disease) (HCC)    Dialysis on Tues Thurs Sat   Exertional shortness of breath    History of kidney stones    HTN (hypertension)    Hypothyroidism    Neuropathy    Obesity    Osteoarthritis    PAF (paroxysmal atrial fibrillation) (HCC)    Peripheral neuropathy    bilateral feet/hands   PONV (postoperative nausea and vomiting)    RBBB (right bundle branch block)    Old   Stroke (HCC)    mini strokes   Syncope    likely due to low blood sugar   Tachycardia    Sinus tachycardia   Type II diabetes mellitus (HCC)    Type II, [name]. patient has omnipod insulin pump with Novolin R Insulin   Urinary incontinence    Venous insufficiency      Significant Hospital Events: Including procedures, antibiotic start and stop dates in addition to other pertinent events     Interim History / Subjective:  Continues to have GI bleed.  Interventional radiology and GI have been paged  Objective   Blood pressure (!) 82/46, pulse 85, temperature 98.1 F (36.7 C), temperature source Oral, resp. rate  13, weight 98 kg, SpO2 100 %.       No intake or output data in the 24 hours ending 05/23/22 0807 Filed Weights   05/22/22 2215  Weight: 98 kg    Examination: Physical exam: General: Weanable obese female who is awake alert follows commands HEENT: MM pink/moist no JVD is appreciated Neuro: Grossly intact without focal defect CV: Heart sounds are regular PULM: Diminished in the bases   GI: soft, bsx4 active tender to touch throughout  Extremities: warm/dry,  edema right B-K amputation is noted, 3+ edema left leg Skin: no rashes or lesions    Resolved Hospital Problem list     Assessment & Plan:  Acute lower GI bleeding Acute on chronic blood loss anemia Probable infectious  proctocolitis Coronary artery disease Chronic biventricular systolic heart failure Paroxysmal A-fib on anticoagulation with Eliquis Poorly controlled diabetes type 2 with hyperglycemia End-stage renal disease on hemodialysis Morbid obesity  Active GI bleed Transfuse in the setting of continue bloody stools with hemoglobin dropping Dr. Elnoria Howard of gastroenterology has been paged Interventional radiology has been called by Dr. Myrla Halsted for questionable IR intervention She will need to be admitted to the intensive care unit  Hypotension in the setting of GI bleed Transfusion Gentle fluid resuscitation in the setting of end-stage renal disease    Diabetes mellitus CBG (last 3)  Recent Labs    05/22/22 2206 05/23/22 0447  GLUCAP 342* 426*   Sliding-scale insulin protocol  Chronic biventricular heart failure Careful with fluid administration  End-stage renal disease with dialysis on Tuesdays Thursdays and Saturdays Nephrology has been paged as of 05/23/2022 at 0 800      Best Practice (right click and "Reselect all SmartList Selections" daily)   Diet/type: NPO DVT prophylaxis: SCD GI prophylaxis: PPI Lines: N/A Foley:  N/A Code Status:  full code Last date of multidisciplinary goals of care discussion [pending]  Labs   CBC: Recent Labs  Lab 05/22/22 2220 05/22/22 2253 05/23/22 0424  WBC 9.4  --  9.8  NEUTROABS 7.6  --  7.8*  HGB 9.0* 10.2* 7.8*  HCT 32.1* 30.0* 25.9*  MCV 85.1  --  85.5  PLT 265  --  258    Basic Metabolic Panel: Recent Labs  Lab 05/22/22 2220 05/22/22 2253  NA 131* 133*  K 3.6 3.6  CL 96* 94*  CO2 24  --   GLUCOSE 344* 357*  BUN 26* 28*  CREATININE 2.80* 2.70*  CALCIUM 8.6*  --    GFR: Estimated Creatinine Clearance: 21.8 mL/min (A) (by C-G formula based on SCr of 2.7 mg/dL (H)). Recent Labs  Lab 05/22/22 2220 05/23/22 0250 05/23/22 0424  WBC 9.4  --  9.8  LATICACIDVEN  --  1.5  --     Liver Function Tests: Recent Labs   Lab 05/22/22 2220  AST 25  ALT 9  ALKPHOS 110  BILITOT <0.1*  PROT 5.9*  ALBUMIN 2.1*   No results for input(s): "LIPASE", "AMYLASE" in the last 168 hours. No results for input(s): "AMMONIA" in the last 168 hours.  ABG    Component Value Date/Time   PHART 7.412 01/15/2022 1633   PCO2ART 32.0 01/15/2022 1633   PO2ART 67 (L) 01/15/2022 1633   HCO3 20.4 01/15/2022 1633   TCO2 27 05/22/2022 2253   ACIDBASEDEF 3.0 (H) 01/15/2022 1633   O2SAT 94 01/15/2022 1633     Coagulation Profile: Recent Labs  Lab 05/22/22 2220  INR 1.4*    Cardiac Enzymes:  No results for input(s): "CKTOTAL", "CKMB", "CKMBINDEX", "TROPONINI" in the last 168 hours.  HbA1C: Hgb A1c MFr Bld  Date/Time Value Ref Range Status  05/02/2022 08:51 AM 8.9 (H) 4.8 - 5.6 % Final    Comment:    (NOTE)         Prediabetes: 5.7 - 6.4         Diabetes: >6.4         Glycemic control for adults with diabetes: <7.0   01/20/2022 03:25 AM >15.5 (H) 4.8 - 5.6 % Final    Comment:    (NOTE)         Prediabetes: 5.7 - 6.4         Diabetes: >6.4         Glycemic control for adults with diabetes: <7.0     CBG: Recent Labs  Lab 05/22/22 2206 05/23/22 0447  GLUCAP 342* 426*      Critical care time:  45 min     Brett Canales Vineeth Fell ACNP Acute Care Nurse Practitioner [facility] Please consult Amion 05/23/2022, 8:07 AM

## 2022-05-23 NOTE — Anesthesia Procedure Notes (Signed)
Procedure Name: MAC Date/Time: 05/23/2022 10:33 AM  Performed by: Griffin Dakin, CRNAPre-anesthesia Checklist: Patient identified, Emergency Drugs available, Suction available, Patient being monitored and Timeout performed Patient Re-evaluated:Patient Re-evaluated prior to induction Oxygen Delivery Method: Simple face mask Preoxygenation: Pre-oxygenation with 100% oxygen Induction Type: IV induction Placement Confirmation: positive ETCO2 and breath sounds checked- equal and bilateral Dental Injury: Teeth and Oropharynx as per pre-operative assessment

## 2022-05-23 NOTE — Consult Note (Signed)
Renal Service Consult Note [facility]  Joselin Crandell 05/23/2022 Sol Blazing, MD Requesting Physician: Dr. Charlsie Quest, CCM  Reason for Consult: ESRD pt w/  HPI: The patient is a 71 y.o. year-old w/ PMH as below who presented today w/ lower abd pain and BRBPR. In ED also pt is anemic, hypotensive and BS out of control starting on IV pressors and IV insulin. She had flex sig this am w/ bleeding source located and treated per the daughter.  We are asked to see for ESRD.    Pt seen in ICU, on pressor support. No resp c/o's. No SOB cough or CP.   ROS - denies CP, no joint pain, no HA, no blurry vision, no rash, no diarrhea, no nausea/ vomiting, no dysuria, no difficulty voiding   Past Medical History  Past Medical History:  Diagnosis Date   Acute MI ([place]) 1999; 2007; 03/05/21   Anemia    hx   Anginal pain (HCC)    Anxiety    ARF (acute renal failure) ([place]) 06/2017    Kidney Asso   Arthritis    "generalized" (03/15/2014)   CAD (coronary artery disease)    MI in 2000 - MI  2007 - treated bare metal stent (no nuclear since then as 9/11)   Carotid artery disease (HCC)    CHF (congestive heart failure) ([place])    HFrEF 03/06/21   Chronic diastolic heart failure ([place])    a) ECHO (08/2013) EF 55-60% and RV function nl b) RHC (08/2013) RA 4, RV 30/5/7, PA 25/10 (16), PCWP 7, Fick CO/CI 6.3/2.7, PVR 1.5 WU, PA 61 and 66%   Daily headache    "~ every other day; since I fell in June" (03/15/2014)   Depression    Dyslipidemia    Dyspnea    uses oxygen qhs via [place]   ESRD (end stage renal disease) ([place])    Dialysis on Tues Thurs Sat   Exertional shortness of breath    History of kidney stones    HTN (hypertension)    Hypothyroidism    Neuropathy    Obesity    Osteoarthritis    PAF (paroxysmal atrial fibrillation) (HCC)    Peripheral neuropathy    bilateral feet/hands   PONV (postoperative nausea and vomiting)    RBBB (right bundle branch block)    Old    Stroke ([place])    mini strokes   Syncope    likely due to low blood sugar   Tachycardia    Sinus tachycardia   Type II diabetes mellitus (HCC)    Type II, Lamar Sprinkles libre left upper arm. patient has omnipod insulin pump with Novolin R Insulin   Urinary incontinence    Venous insufficiency    Past Surgical History  Past Surgical History:  Procedure Laterality Date   ABDOMINAL HYSTERECTOMY  1980's   AMPUTATION Right 02/24/2018   Procedure: RIGHT FOOT GREAT TOE AND 2ND TOE AMPUTATION;  Surgeon: Newt Minion, MD;  Location: [place];  Service: Orthopedics;  Laterality: Right;   AMPUTATION Right 04/30/2018   Procedure: RIGHT TRANSMETATARSAL AMPUTATION;  Surgeon: Newt Minion, MD;  Location: [place];  Service: Orthopedics;  Laterality: Right;   AMPUTATION Right 05/02/2022   Procedure: RIGHT BELOW KNEE AMPUTATION;  Surgeon: Newt Minion, MD;  Location: [facility];  Service: Orthopedics;  Laterality: Right;   AV FISTULA PLACEMENT Left 04/02/2022   Procedure: LEFT ARM ARTERIOVENOUS (AV) FISTULA CREATION;  Surgeon: Serafina Mitchell, MD;  [facility];  Service: Vascular;  Laterality: Left;  PERIPHERAL NERVE BLOCK   BIOPSY  05/27/2020   Procedure: BIOPSY;  Surgeon: [name], DO;  [facility];  Service: Endoscopy;;   CATARACT EXTRACTION, BILATERAL Bilateral ?2013   COLONOSCOPY W/ POLYPECTOMY     COLONOSCOPY WITH PROPOFOL N/A 03/13/2019   Procedure: COLONOSCOPY WITH PROPOFOL;  Surgeon: Jerene Bears, MD;  Location: [place];  Service: Gastroenterology;  Laterality: N/A;   CORONARY ANGIOPLASTY WITH STENT PLACEMENT  1999; 2007   "1 + 1"   ERCP N/A 02/03/2022   Procedure: ENDOSCOPIC RETROGRADE CHOLANGIOPANCREATOGRAPHY (ERCP);  Surgeon: Carol Ada, MD;  Location: [place];  Service: Gastroenterology;  Laterality: N/A;   ESOPHAGOGASTRODUODENOSCOPY (EGD) WITH PROPOFOL N/A 03/13/2019   Procedure: ESOPHAGOGASTRODUODENOSCOPY (EGD) WITH PROPOFOL;  Surgeon: Jerene Bears, MD;   Location: [facility]OSCOPY;  Service: Gastroenterology;  Laterality: N/A;   ESOPHAGOGASTRODUODENOSCOPY (EGD) WITH PROPOFOL N/A 05/27/2020   Procedure: ESOPHAGOGASTRODUODENOSCOPY (EGD) WITH PROPOFOL;  Surgeon: [name], DO;  [facility];  Service: Endoscopy;  Laterality: N/A;   EYE SURGERY Bilateral    lazer   HEMOSTASIS CLIP PLACEMENT  03/13/2019   Procedure: HEMOSTASIS CLIP PLACEMENT;  Surgeon: Jerene Bears, MD;  Location: [place] [facility]OSCOPY;  Service: Gastroenterology;;   I & D EXTREMITY Left 05/05/2022   Procedure: IRRIGATION AND DEBRIDEMENT LEFT ARM AV FISTULA;  Surgeon: Marty Heck, MD;  Location: [place];  Service: Vascular;  Laterality: Left;   INSERTION OF DIALYSIS CATHETER Right 04/02/2022   Procedure: INSERTION OF TUNNELED DIALYSIS CATHETER;  Surgeon: Serafina Mitchell, MD;  Location: [place];  Service: Vascular;  Laterality: Right;   KNEE ARTHROSCOPY Left 10/25/2006   POLYPECTOMY  03/13/2019   Procedure: POLYPECTOMY;  Surgeon: Jerene Bears, MD;  Location: [place] ENDOSCOPY;  Service: Gastroenterology;;   REMOVAL OF STONES  02/03/2022   Procedure: REMOVAL OF STONES;  Surgeon: Carol Ada, MD;  Location: [place];  Service: Gastroenterology;;   RIGHT HEART CATH N/A 07/24/2017   Procedure: RIGHT HEART CATH;  Surgeon: Jolaine Artist, MD;  Location: [place] [facility];  Service: Cardiovascular;  Laterality: N/A;   RIGHT HEART CATHETERIZATION N/A 09/22/2013   Procedure: RIGHT HEART CATH;  Surgeon: Jolaine Artist, MD;  Location: [facility] CATH LAB;  Service: Cardiovascular;  Laterality: N/A;   SHOULDER ARTHROSCOPY WITH OPEN ROTATOR CUFF REPAIR Right 03/14/2014   Procedure: RIGHT SHOULDER ARTHROSCOPY WITH BICEPS RELEASE, OPEN SUBSCAPULA REPAIR, OPEN SUPRASPINATUS REPAIR.;  Surgeon: Meredith Pel, MD;  Location: [place];  Service: Orthopedics;  Laterality: Right;   SPHINCTEROTOMY  02/03/2022   Procedure: SPHINCTEROTOMY;  Surgeon: Carol Ada, MD;  Location: [place];   Service: Gastroenterology;;   TEE WITHOUT CARDIOVERSION N/A 02/04/2022   Procedure: TRANSESOPHAGEAL ECHOCARDIOGRAM (TEE);  Surgeon: Jolaine Artist, MD;  Location: [facility] ENDOSCOPY;  Service: Cardiovascular;  Laterality: N/A;   TOE AMPUTATION Right 02/24/2018   GREAT TOE AND 2ND TOE AMPUTATION   TUBAL LIGATION  25's   Family History  Family History  Problem Relation Age of Onset   Heart attack Mother 48   Social History  reports that she quit smoking about 24 years ago. Her smoking use included cigarettes. She has a 96.00 pack-year smoking history. She has never used smokeless tobacco. She reports that she does not currently use alcohol. She reports that she does not use drugs. Allergies  Allergies  Allergen Reactions   Keflex [Cephalexin] Diarrhea   Codeine Nausea And Vomiting and Other (See  Comments)   Home medications Prior to Admission medications   Medication Sig Start Date End Date Taking? Authorizing Provider  albuterol (VENTOLIN HFA) 108 (90 Base) MCG/ACT inhaler Inhale 1-2 puffs into the lungs every 6 (six) hours as needed for wheezing or shortness of breath.   Yes [provider]  Amino Acids-Protein Hydrolys (FEEDING SUPPLEMENT, PRO-STAT SUGAR FREE 64,) LIQD Take 30 mLs by mouth in the morning and at bedtime.   Yes [provider]  amiodarone (PACERONE) 200 MG tablet Take 1 tablet (200 mg total) by mouth daily. 01/29/22  Yes Milford, Maricela Bo, FNP  apixaban (ELIQUIS) 5 MG TABS tablet Take 1 tablet (5 mg total) by mouth 2 (two) times daily. 01/29/22  Yes [place], Maricela Bo, FNP  B Complex-C-Folic Acid (DIALYVITE TABLET) TABS Take 1 tablet by mouth daily. 04/22/22  Yes [provider]  carvedilol (COREG) 6.25 MG tablet Take 6.25 mg by mouth 2 (two) times daily. 05/15/22  Yes [provider]  diphenoxylate-atropine (LOMOTIL) 2.5-0.025 MG tablet Take 1 tablet by mouth 4 (four) times daily as needed for diarrhea or loose stools. 08/19/21  Yes  [name], MD  ferrous sulfate 325 (65 FE) MG tablet Take 325 mg by mouth 2 (two) times daily.   Yes [provider]  fidaxomicin (DIFICID) 200 MG TABS tablet Take 1 tablet (200 mg total) by mouth 2 (two) times daily. 03/28/22  Yes Rosiland Oz, MD  folic acid-vitamin b complex-vitamin c-selenium-zinc (DIALYVITE) 3 MG TABS tablet Take 1 tablet by mouth daily.   Yes [provider]  hydrALAZINE (APRESOLINE) 25 MG tablet Take 1 tablet (25 mg total) by mouth every 6 (six) hours as needed (SBP>150 or DBP>100). Patient taking differently: Take 25 mg by mouth 4 (four) times daily. (Hold for SBP less than 110 or HR less than 60) 04/06/22  Yes Samtani, Jai-Gurmukh, MD  hydrocortisone (ANUSOL-HC) 2.5 % rectal cream Place 1 Application rectally 2 (two) times daily.   Yes [provider]  insulin aspart (NOVOLOG FLEXPEN) 100 UNIT/ML FlexPen Inject 0-15 Units into the skin See admin instructions. CBG <70 = Notify NP/MD 71-149 = 0 units 150-199 = 4 units 200-249 = 6 units 250- 299 = 10 units 300- 349 = 12 units 350- 399 = 15 units, > 400 call MD ( prime pen with 2 units prior  to set dose)   Yes [provider]  levothyroxine (SYNTHROID, LEVOTHROID) 50 MCG tablet Take 1 tablet (50 mcg total) by mouth daily before breakfast. 11/07/16  Yes Dena Billet B, PA-C  linagliptin (TRADJENTA) 5 MG TABS tablet Take 1 tablet (5 mg total) by mouth daily. 04/07/22  Yes Nita Sells, MD  magnesium oxide (MAG-OX) 400 (240 Mg) MG tablet Take 1 tablet (400 mg total) by mouth daily. 07/18/21  Yes [name], MD  Nutritional Supplements (FEEDING SUPPLEMENT, GLUCERNA 1.2 CAL,) LIQD Take 237 mLs by mouth daily.   Yes [provider]  ondansetron (ZOFRAN) 4 MG tablet Take 4 mg by mouth every 8 (eight) hours as needed for nausea.   Yes [provider]  oxyCODONE-acetaminophen (PERCOCET/ROXICET) 5-325 MG tablet Take 1 tablet by mouth every 4 (four) hours as  needed. 05/08/22  Yes Newt Minion, MD  polyethylene glycol (MIRALAX / GLYCOLAX) 17 g packet Take 17 g by mouth daily.   Yes [provider]  pregabalin (LYRICA) 75 MG capsule Take 1 capsule (75 mg total) by mouth daily. 04/07/22  Yes Nita Sells, MD  saccharomyces  boulardii (FLORASTOR) 250 MG capsule Take 500 mg by mouth 2 (two) times daily. X14 days   Yes [provider]  senna-docusate (SENOKOT-S) 8.6-50 MG tablet Take 2 tablets by mouth at bedtime.   Yes [provider]  sevelamer carbonate (RENVELA) 800 MG tablet Take 1,600 mg by mouth 3 (three) times daily with meals.   Yes [provider]  vortioxetine HBr (TRINTELLIX) 10 MG TABS tablet Take 2 tablets (20 mg total) by mouth daily. 04/23/22  Yes [name], MD  aspirin EC 81 MG tablet Take 1 tablet (81 mg total) by mouth daily with breakfast. 05/28/20   Emokpae, Courage, MD  blood glucose meter kit and supplies Use up to four times daily as directed. 01/23/22   Ghimire, Henreitta Leber, MD  Continuous Blood Gluc Sensor (FREESTYLE LIBRE 2 SENSOR) MISC  01/14/22   [provider]  Fingerstix Lancets MISC Use as directed to check blood sugars as need up to 4 times daily 01/23/22   Ghimire, Henreitta Leber, MD  Insulin Pen Needle 32G X 4 MM MISC Use as directed 01/23/22   Jonetta Osgood, MD  OXYGEN Inhale 2 L/min into the lungs at bedtime.    [provider]     Vitals:   05/23/22 1140 05/23/22 1200 05/23/22 1215 05/23/22 1230  BP: (!) 153/53 (!) 140/45 (!) 133/47 (!) 130/44  Pulse: 85 80 82 79  Resp: _0 Temp:      TempSrc:      SpO2: 100% 100% 100% 100%  Weight:       Exam Gen alert, no distress, nasal O2, pleasant No rash, cyanosis or gangrene Sclera anicteric, throat clear  No jvd or bruits Chest clear bilat to bases, no rales/ wheezing RRR no MRG Abd soft quite obese ntnd no mass or ascites +bs GU defer MS R BKA Ext diffuse 2+ bilat hip  edema Neuro is  alert, Ox 3 , nf    RIJ TDC intact / L arm AVF+ bruit , resting d/t infection    Home meds include - albuterol, pacerone, eliquis, coreg 6.25 bid, fidaxomicin, hydralazine 25 qid prn, insulin aspart, synthroid, linagliptin, pregabalin, renvela 2 ac tid, vortioxetine, prns/ vits/ supps     OP HD: SW TTS  4h  400/800   98.5kg  3K/2.5Ca bath  TDC/ AVF resting due to recent infection - mircera 150 mcg q2, last 12/26 - last Hb 8.8 on 12/21, due 06/03/22 - last HD 12/28, post wt 99.3 - no vdra   Assessment/ Plan: Lower GIB - sp flex sig today, per GI / pmd Shock - on pressor support ESRD - on HD TTS. Has not missed HD. HD tomorrow.  HD access - using TDC and resting L AVF due to recent infection H/o HTN - holding home BP meds d/t hemorrhagic shock Volume - sig edema in LE"s, not eating well per dtr for a while. At dry wt. Max UF w/ HD tomorrow as tol.  Anemia esrd - Hb 7-10 here, just had esa at OP unit. Follow and transfuse prn.  MBD ckd - CCa in range, will add on phos.  Uncont DM - per pmd      Kelly Splinter  MD 05/23/2022, 2:05 PM Recent Labs  Lab 05/22/22 2220 05/22/22 2253 05/23/22 0424  HGB 9.0* 10.2* 7.8*  ALBUMIN 2.1*  --   --   CALCIUM 8.6*  --   --   CREATININE 2.80* 2.70*  --  K 3.6 3.6  --    Inpatient medications:  sodium chloride   Intravenous Once   sodium chloride   Intravenous Once   insulin aspart  0-15 Units Subcutaneous Q4H   pantoprazole (PROTONIX) IV  40 mg Intravenous Q12H    norepinephrine (LEVOPHED) Adult infusion 4 mcg/min (05/23/22 1312)   piperacillin-tazobactam (ZOSYN)  IV Stopped (05/23/22 0617)   docusate sodium, polyethylene glycol

## 2022-05-23 NOTE — Progress Notes (Signed)
Patient is DNR, order entered.

## 2022-05-23 NOTE — ED Provider Notes (Signed)
Ultrasound ED Peripheral IV (Provider)  Date/Time: 05/23/2022 12:12 PM  Performed by: Lianne Cure, DO Authorized by: Lianne Cure, DO   Procedure details:    Indications: hypotension and multiple failed IV attempts     Skin Prep: chlorhexidine gluconate     Location: right upper extremities above the [facility]   Angiocath:  18 G   Bedside Ultrasound Guided: Yes     Images: not archived     Patient tolerated procedure without complications: Yes     Dressing applied: Yes       Lianne Cure, DO 37/09/64 1213

## 2022-05-23 NOTE — Inpatient Diabetes Management (Addendum)
Inpatient Diabetes Program Recommendations  AACE/ADA: New Consensus Statement on Inpatient Glycemic Control (2015)  Target Ranges:  Prepandial:   less than 140 mg/dL      Peak postprandial:   less than 180 mg/dL (1-2 hours)      Critically ill patients:  140 - 180 mg/dL   Lab Results  Component Value Date   GLUCAP 426 (H) 05/23/2022   HGBA1C 8.9 (H) 05/02/2022    Review of Glycemic Control  Latest Reference Range & Units 05/22/22 22:06 05/23/22 04:47  Glucose-Capillary 70 - 99 mg/dL 342 (H) 426 (H)  (H): Data is abnormally high  Diabetes history: DM2  Outpatient Diabetes medications:  While in rehab-? OmniPod with U100 Regular insulin (total basal 25.9 units/day, Carb coverage 1:12 grams, Sensitivity factor 1:40 mg/dl   Current orders for Inpatient glycemic control: Novolog 0-15 units Q4H See's Dr. [name], endocrinology  Inpatient Diabetes Program Recommendations:    Semglee 25 units QD Continue Novolog 0-15 units Q4H while NPO  Spoke with patient briefly at bedside.  She states she has not been using her insulin pump or Freestyle Libre 2 cgm since being admitted to rehab at [place].  She states, "they have been giving me insulin and checking my blood sugar with finger sticks, I don't know what insulins they have been giving me".    Called Her daughter, Ms. Wagoner [phone].  She states she has not used her Omnpod since she has been in rehab.  Her husband has the insulin pump at home.    Called [facility] to confirm insulins.  Waiting on a call back.  Will continue to follow while inpatient.  Thank you, Reche Dixon, MSN, [place] [facility] Coordinator Inpatient Diabetes Program [phone] (team pager from 8a-5p)

## 2022-05-23 NOTE — Procedures (Signed)
Central Venous Catheter Insertion Procedure Note  Susan [name]  [id number]  [birth year]  Date:05/23/22  Time:2:44 PM   Provider Performing:Pete Johnette Abraham Kary Kos   Procedure: Insertion of Non-tunneled Central Venous [id number]) with US guidance (89211)   Indication(s) Difficult access  Consent Risks of the procedure as well as the alternatives and risks of each were explained to the patient and/or caregiver.  Consent for the procedure was obtained and is signed in the bedside chart  Anesthesia Topical only with 1% lidocaine   Timeout Verified patient identification, verified procedure, site/side was marked, verified correct patient position, special equipment/implants available, medications/allergies/relevant history reviewed, required imaging and test results available.  Sterile Technique Maximal sterile technique including full sterile barrier drape, hand hygiene, sterile gown, sterile gloves, mask, hair covering, sterile ultrasound probe cover (if used).  Procedure Description Area of catheter insertion was cleaned with chlorhexidine and draped in sterile fashion.  With real-time ultrasound guidance a central venous catheter was placed into the left internal jugular vein. Nonpulsatile blood flow and easy flushing noted in all ports.  The catheter was sutured in place and sterile dressing applied.  Complications/Tolerance None; patient tolerated the procedure well. Chest X-ray is ordered to verify placement for internal jugular or subclavian cannulation.   Chest x-ray is not ordered for femoral cannulation.  EBL Minimal  Specimen(s) None  Erick Colace ACNP-BC [place] Pager # [phone] OR # [phone] if no answer

## 2022-05-23 NOTE — Anesthesia Postprocedure Evaluation (Signed)
Anesthesia Post Note  Patient: Susan [name]  Procedure(s) Performed: FLEXIBLE SIGMOIDOSCOPY HOT HEMOSTASIS (ARGON PLASMA COAGULATION/BICAP) HEMOSTASIS CLIP PLACEMENT     Patient location during evaluation: PACU Anesthesia Type: MAC Level of consciousness: awake (as in preop) Pain management: pain level controlled Vital Signs Assessment: post-procedure vital signs reviewed and stable Respiratory status: spontaneous breathing, nonlabored ventilation and respiratory function stable Cardiovascular status: stable and blood pressure returned to baseline Anesthetic complications: no   No notable events documented.  Last Vitals:  Vitals:   05/23/22 1130 05/23/22 1140  BP: (!) 139/52 (!) 153/53  Pulse: 86 85  Resp: 16 13  Temp:    SpO2: 100% 100%    Last Pain:  Vitals:   05/23/22 1120  TempSrc:   PainSc: 0-No pain                 Audry Pili

## 2022-05-23 NOTE — Op Note (Signed)
[facility] Patient Name: Susan [name] Procedure Date : 05/23/2022 MRN: [id number] Attending MD: Carol Ada , MD, [id number] Date of Birth: [birth year] CSN: [id number] Age: 71 Admit Type: Inpatient Procedure:                Flexible Sigmoidoscopy Indications:              Hematochezia, Abnormal CT of the GI tract Providers:                Carol Ada, MD, Grace Isaac, RN, Gloris Ham, Technician Referring MD:              Medicines:                Propofol per Anesthesia Complications:            No immediate complications. Estimated Blood Loss:     Estimated blood loss: none. Procedure:                Pre-Anesthesia Assessment:                           - Prior to the procedure, a History and Physical                            was performed, and patient medications and                            allergies were reviewed. The patient's tolerance of                            previous anesthesia was also reviewed. The risks                            and benefits of the procedure and the sedation                            options and risks were discussed with the patient.                            All questions were answered, and informed consent                            was obtained. Prior Anticoagulants: The patient has                            taken Eliquis (apixaban), last dose was 2 days                            prior to procedure. ASA Grade Assessment: III - A                            patient with severe systemic disease. After  reviewing the risks and benefits, the patient was                            deemed in satisfactory condition to undergo the                            procedure.                           - Sedation was administered by an anesthesia                            professional. Deep sedation was attained.                           After obtaining informed consent, the  scope was                            passed under direct vision. The [id number]                            ([id number]) Olympus endoscope was introduced through                            the anus and advanced to the the sigmoid colon. The                            flexible sigmoidoscopy was technically difficult                            and complex. The quality of the bowel preparation                            was adequate. Scope In: Scope Out: Findings:      A few ten mm ulcers were found in the rectum. No bleeding was present.       Stigmata of recent bleeding were present. Coagulation for hemostasis       using bipolar probe was successful. For hemostasis, two hemostatic clips       were successfully placed (MR conditional). Clip manufacturer: [employer]. There was no bleeding at the end of the procedure.      Initial insertion of the endoscope showed that there was a large clot.       The clot was removed digitally. The site of bleeding was identified in       the distal rectum just above the dentate line. A large ulceration was       found with a visible vessel. Several other ulcers were noted around the       distal rectum. 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 The visible vessel was ablated with a 10 Fr BICAP probe.       A 360 degree hemoclip partially closed the ulcer, but the [name]       was able to close up the ulcer at the visible vessel site. Impression:               -  A few ulcers in the rectum. Treated with bipolar                            cautery. Clips (MR conditional) were placed. Clip                            manufacturer: [employer].                           - No specimens collected. Recommendation:           - Return patient to hospital ward for ongoing care.                           - Follow HGB and transfuse as necessary.                           - If rebleeding occurs, hemostatic gel  versus spray                            can be used.                           - If bleeding is persistent IR intervention will be                            required. Procedure Code(s):        --- Professional ---                           [id number], Sigmoidoscopy, flexible; with control of                            bleeding, any method Diagnosis Code(s):        --- Professional ---                           K62.6, Ulcer of anus and rectum                           K92.1, Melena (includes Hematochezia)                           R93.3, Abnormal findings on diagnostic imaging of                            other parts of digestive tract CPT copyright 2022 American Medical Association. All rights reserved. The codes documented in this report are preliminary and upon coder review may  be revised to meet current compliance requirements. Carol Ada, MD Carol Ada, MD 05/23/2022 11:23:47 AM This report has been signed electronically. Number of Addenda: 0

## 2022-05-23 NOTE — ED Notes (Signed)
Paged [facility] concerning elevated blood sugar

## 2022-05-23 NOTE — ED Notes (Signed)
Patient incontinent to bloody stool. Multiple clots observed. Patient continues to soak through the diaper, gown and saturates the pad.

## 2022-05-23 NOTE — Transfer of Care (Signed)
Immediate Anesthesia Transfer of Care Note  Patient: Vashon Arch  Procedure(s) Performed: [facility] (ARGON PLASMA COAGULATION/BICAP) HEMOSTASIS CLIP PLACEMENT  Patient Location: Endoscopy Unit  Anesthesia Type:MAC  Level of Consciousness: awake and oriented  Airway & Oxygen Therapy: Patient Spontanous Breathing and Patient connected to nasal cannula oxygen  Post-op Assessment: Report given to RN and Post -op Vital signs reviewed and stable  Post vital signs: Reviewed and stable  Last Vitals:  Vitals Value Taken Time  BP    Temp    Pulse 84 05/23/22 1115  Resp 17 05/23/22 1115  SpO2 100 % 05/23/22 1115  Vitals shown include unvalidated device data.  Last Pain:  Vitals:   05/23/22 0925  TempSrc: Temporal  PainSc: 10-Worst pain ever         Complications: No notable events documented.

## 2022-05-23 NOTE — Anesthesia Preprocedure Evaluation (Addendum)
Anesthesia Evaluation  Patient identified by MRN, date of birth, ID bandGeneral Assessment Comment:Drowsy but arousable   Reviewed: Allergy & Precautions, NPO status , Patient's Chart, lab work & pertinent test results  History of Anesthesia Complications (+) PONV and history of anesthetic complications  Airway Mallampati: III  TM Distance: >3 FB Neck ROM: Full    Dental  (+) Dental Advisory Given   Pulmonary former smoker   Pulmonary exam normal        Cardiovascular hypertension, + CAD, + Past MI, + Peripheral Vascular Disease and +CHF  Normal cardiovascular exam+ dysrhythmias Atrial Fibrillation    '23 TEE - EF 30%. Right ventricular systolic function is moderately reduced. The right ventricular size is moderately enlarged. Left atrial size was moderately dilated. No left atrial/left atrial appendage thrombus was detected. Right atrial size was severely dilated. Trivial mitral valve regurgitation. Tricuspid valve regurgitation is severe. 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 There is Moderate (Grade III) plaque involving the descending aorta.      Neuro/Psych  Headaches PSYCHIATRIC DISORDERS Anxiety Depression    TIA Neuromuscular disease    GI/Hepatic Neg liver ROS,GERD  Controlled,,  Endo/Other  diabetes, Poorly ControlledHypothyroidism    Renal/GU ESRF and DialysisRenal disease    Urinary incontinence     Musculoskeletal  (+) Arthritis ,    Abdominal   Peds  Hematology  (+) Blood dyscrasia, anemia   Anesthesia Other Findings   Reproductive/Obstetrics                              Anesthesia Physical Anesthesia Plan  ASA: 4 and emergent  Anesthesia Plan: MAC   Post-op Pain Management: Minimal or no pain anticipated   Induction:   PONV Risk Score and Plan: 3 and Propofol infusion and Treatment may vary due to  age or medical condition  Airway Management Planned: Nasal Cannula and Natural Airway  Additional Equipment: None  Intra-op Plan:   Post-operative Plan:   Informed Consent: I have reviewed the patients History and Physical, chart, labs and discussed the procedure including the risks, benefits and alternatives for the proposed anesthesia with the patient or authorized representative who has indicated his/her understanding and acceptance.       Plan Discussed with: CRNA and Anesthesiologist  Anesthesia Plan Comments:         Anesthesia Quick Evaluation

## 2022-05-23 NOTE — ED Notes (Signed)
Per Jenny Reichmann pharmacist.. insulin was ordered by anesthesia during procedure and was intended to be stopped upon patient finishing the procedure and before return to her room in the ER

## 2022-05-23 NOTE — ED Notes (Signed)
Back From CT 

## 2022-05-23 NOTE — ED Notes (Signed)
In CT

## 2022-05-23 NOTE — Inpatient Diabetes Management (Signed)
Inpatient Diabetes Program Recommendations  AACE/ADA: New Consensus Statement on Inpatient Glycemic Control (2015)  Target Ranges:  Prepandial:   less than 140 mg/dL      Peak postprandial:   less than 180 mg/dL (1-2 hours)      Critically ill patients:  140 - 180 mg/dL   Lab Results  Component Value Date   GLUCAP 486 (H) 05/23/2022   HGBA1C 8.9 (H) 05/02/2022    Received call back from nurse at [facility].  Ms. Hentges has not been receiving basal insulin in rehab.  Nurse states they have had a very hard time controlling her glucose.  The last orders for insulin at rehab prior to ED visit was Novolog 0-15 units TID & call MD if > '400mg'$ /dL.  Nurse states her blood sugars have been as high as 521 mg/dL.    MD-Please ensure patient has basal insulin orders prior to discharging back to rehab.    She is not using her insulin pump while in rehab.    Will continue to follow while inpatient.  Thank you, Reche Dixon, MSN, [place] [facility] Coordinator Inpatient Diabetes Program [phone] (team pager from 8a-5p)

## 2022-05-23 NOTE — ED Notes (Signed)
Blood transfusing at 956m/hr as the pt is actively bleeding and BP is  dropping. ICU NP at bedside aware.

## 2022-05-23 NOTE — ED Notes (Signed)
To CT

## 2022-05-23 NOTE — ED Notes (Signed)
Back from CT

## 2022-05-23 NOTE — Consult Note (Signed)
Reason for Consult: Hematochezia and positive CT angio Referring Physician: [facility]  [name] HPI: This is a 71 year old female with multiple medical problems admitted for hematochezia.  The patient is very somnolent and the history was obtained form the chart.  It was reported that she had dark bloody bowel movements along with diarrhea and abdominal pain several days before admission.  On average she had 2-3 hematochezia events, but then it worsened yesterday.  She was reported to have 6-7 episodes of bleeding and she felt weak.  In the ER she was noted to be hypotensive.  A CT angio was obtained initially and there was no evidence of bleeding, however, with ongoing bleeding, the scan was repeated at 5:30 AM.  This time it was positive and there was a source of bleeding in the rectum.  Her last HGB this AM was at 7.8 g/L and her baseline is around 8-9 g/dL.  Her last colonoscopy was on 03/03/2019 while she was in the hospital.  At that time she presented with melena and anemia.  Multiple polyps were removed, but no bleeding site was identified.  Past Medical History:  Diagnosis Date   Acute MI ([place]) 1999; 2007; 03/05/21   Anemia    hx   Anginal pain (HCC)    Anxiety    ARF (acute renal failure) ([place]) 06/2017   [facility]   Arthritis    "generalized" (03/15/2014)   CAD (coronary artery disease)    MI in 2000 - MI  2007 - treated bare metal stent (no nuclear since then as 9/11)   Carotid artery disease (HCC)    CHF (congestive heart failure) ([place])    HFrEF 03/06/21   Chronic diastolic heart failure ([place])    a) ECHO (08/2013) EF 55-60% and RV function nl b) RHC (08/2013) RA 4, RV 30/5/7, PA 25/10 (16), PCWP 7, Fick CO/CI 6.3/2.7, PVR 1.5 WU, PA 61 and 66%   Daily headache    "~ every other day; since I fell in June" (03/15/2014)   Depression    Dyslipidemia    Dyspnea    uses oxygen qhs via [place]   ESRD (end stage renal disease) ([place])    Dialysis on Tues Thurs Sat    Exertional shortness of breath    History of kidney stones    HTN (hypertension)    Hypothyroidism    Neuropathy    Obesity    Osteoarthritis    PAF (paroxysmal atrial fibrillation) (HCC)    Peripheral neuropathy    bilateral feet/hands   PONV (postoperative nausea and vomiting)    RBBB (right bundle branch block)    Old   Stroke ([place])    mini strokes   Syncope    likely due to low blood sugar   Tachycardia    Sinus tachycardia   Type II diabetes mellitus (HCC)    Type II, Lamar Sprinkles libre left upper arm. patient has omnipod insulin pump with Novolin R Insulin   Urinary incontinence    Venous insufficiency     Past Surgical History:  Procedure Laterality Date   ABDOMINAL HYSTERECTOMY  1980's   AMPUTATION Right 02/24/2018   Procedure: RIGHT FOOT GREAT TOE AND 2ND TOE AMPUTATION;  Surgeon: Newt Minion, MD;  Location: [place];  Service: Orthopedics;  Laterality: Right;   AMPUTATION Right 04/30/2018   Procedure: RIGHT TRANSMETATARSAL AMPUTATION;  Surgeon: Newt Minion, MD;  Location: [place];  Service: Orthopedics;  Laterality: Right;  AMPUTATION Right 05/02/2022   Procedure: RIGHT BELOW KNEE AMPUTATION;  Surgeon: Newt Minion, MD;  Location: [place];  Service: Orthopedics;  Laterality: Right;   AV FISTULA PLACEMENT Left 04/02/2022   Procedure: LEFT ARM ARTERIOVENOUS (AV) FISTULA CREATION;  Surgeon: Serafina Mitchell, MD;  Location: [place];  Service: Vascular;  Laterality: Left;  PERIPHERAL NERVE BLOCK   BIOPSY  05/27/2020   Procedure: BIOPSY;  Surgeon: [name], DO;  [facility];  Service: Endoscopy;;   CATARACT EXTRACTION, BILATERAL Bilateral ?2013   COLONOSCOPY W/ POLYPECTOMY     COLONOSCOPY WITH PROPOFOL N/A 03/13/2019   Procedure: COLONOSCOPY WITH PROPOFOL;  Surgeon: Jerene Bears, MD;  Location: [place];  Service: Gastroenterology;  Laterality: N/A;   CORONARY ANGIOPLASTY WITH STENT PLACEMENT  1999; 2007   "1 + 1"   ERCP N/A 02/03/2022   Procedure:  ENDOSCOPIC RETROGRADE CHOLANGIOPANCREATOGRAPHY (ERCP);  Surgeon: Carol Ada, MD;  Location: [place];  Service: Gastroenterology;  Laterality: N/A;   ESOPHAGOGASTRODUODENOSCOPY (EGD) WITH PROPOFOL N/A 03/13/2019   Procedure: ESOPHAGOGASTRODUODENOSCOPY (EGD) WITH PROPOFOL;  Surgeon: Jerene Bears, MD;  Location[facility]COPY;  Service: Gastroenterology;  Laterality: N/A;   ESOPHAGOGASTRODUODENOSCOPY (EGD) WITH PROPOFOL N/A 05/27/2020   Procedure: ESOPHAGOGASTRODUODENOSCOPY (EGD) WITH PROPOFOL;  Surgeon: [name], DO;  [facility];  Service: Endoscopy;  Laterality: N/A;   EYE SURGERY Bilateral    [place]azer   HEMOSTASIS CLIP PLACEMENT  03/13/2019   Procedure: HEMOSTASIS CLIP PLACEMENT;  Surgeon: Jerene Bears, MD;  Location: [place] ENDOSCOPY;  Service: Gastroenterology;;   I & D EXTREMITY Left 05/05/2022   Procedure: IRRIGATION AND DEBRIDEMENT LEFT ARM AV FISTULA;  Surgeon: Marty Heck, MD;  Location: [place];  Service: Vascular;  Laterality: Left;   INSERTION OF DIALYSIS CATHETER Right 04/02/2022   Procedure: INSERTION OF TUNNELED DIALYSIS CATHETER;  Surgeon: Serafina Mitchell, MD;  Location: [place];  Service: Vascular;  Laterality: Right;   KNEE ARTHROSCOPY Left 10/25/2006   POLYPECTOMY  03/13/2019   Procedure: POLYPECTOMY;  Surgeon: Jerene Bears, MD;  Location: [place] [facility];  Service: Gastroenterology;;   REMOVAL OF STONES  02/03/2022   Procedure: REMOVAL OF STONES;  Surgeon: Carol Ada, MD;  Location: [place];  Service: Gastroenterology;;   RIGHT HEART CATH N/A 07/24/2017   Procedure: RIGHT HEART CATH;  Surgeon: Jolaine Artist, MD;  Location: [place] [facility] LAB;  Service: Cardiovascular;  Laterality: N/A;   RIGHT HEART CATHETERIZATION N/A 09/22/2013   Procedure: RIGHT HEART CATH;  Surgeon: Jolaine Artist, MD;  Location: [facility] CATH LAB;  Service: Cardiovascular;  Laterality: N/A;   SHOULDER ARTHROSCOPY WITH OPEN ROTATOR CUFF REPAIR Right 03/14/2014   Procedure:  RIGHT SHOULDER ARTHROSCOPY WITH BICEPS RELEASE, OPEN SUBSCAPULA REPAIR, OPEN SUPRASPINATUS REPAIR.;  Surgeon: Meredith Pel, MD;  Location: [place];  Service: Orthopedics;  Laterality: Right;   SPHINCTEROTOMY  02/03/2022   Procedure: SPHINCTEROTOMY;  Surgeon: Carol Ada, MD;  Location: [place];  Service: Gastroenterology;;   TEE WITHOUT CARDIOVERSION N/A 02/04/2022   Procedure: TRANSESOPHAGEAL ECHOCARDIOGRAM (TEE);  Surgeon: Jolaine Artist, MD;  Location: [facility] ENDOSCOPY;  Service: Cardiovascular;  Laterality: N/A;   TOE AMPUTATION Right 02/24/2018   GREAT TOE AND 2ND TOE AMPUTATION   TUBAL LIGATION  60's    Family History  Problem Relation Age of Onset   Heart attack Mother 76    Social History:  reports that she quit smoking about 24 years ago. Her smoking use included cigarettes. She  has a 96.00 pack-year smoking history. She has never used smokeless tobacco. She reports that she does not currently use alcohol. She reports that she does not use drugs.  Allergies:  Allergies  Allergen Reactions   Keflex [Cephalexin] Diarrhea   Codeine Nausea And Vomiting and Other (See Comments)    Medications: Scheduled:  [MAR Hold] sodium chloride   Intravenous Once   [MAR Hold] sodium chloride   Intravenous Once   [MAR Hold] insulin aspart  0-15 Units Subcutaneous Q4H   [MAR Hold] pantoprazole (PROTONIX) IV  40 mg Intravenous Q12H   Continuous:  sodium chloride     norepinephrine (LEVOPHED) Adult infusion 2 mcg/min (05/23/22 0851)   [MAR Hold] piperacillin-tazobactam (ZOSYN)  IV Stopped (05/23/22 0617)    Results for orders placed or performed during the hospital encounter of 05/22/22 (from the past 24 hour(s))  CBG monitoring, ED     Status: Abnormal   Collection Time: 05/22/22 10:06 PM  Result Value Ref Range   Glucose-Capillary 342 (H) 70 - 99 mg/dL  Prepare RBC (crossmatch)     Status: None   Collection Time: 05/22/22 10:19 PM  Result Value Ref Range   Order  Confirmation      ORDER PROCESSED BY BLOOD BANK Performed at [facility], [address] [place], [place] [address]   CBC with Differential     Status: Abnormal   Collection Time: 05/22/22 10:20 PM  Result Value Ref Range   WBC 9.4 4.0 - 10.5 K/uL   RBC 3.77 (L) 3.87 - 5.11 MIL/uL   Hemoglobin 9.0 (L) 12.0 - 15.0 g/dL   HCT 32.1 (L) 36.0 - 46.0 %   MCV 85.1 80.0 - 100.0 fL   MCH 23.9 (L) 26.0 - 34.0 pg   MCHC 28.0 (L) 30.0 - 36.0 g/dL   RDW 19.9 (H) 11.5 - 15.5 %   Platelets 265 150 - 400 K/uL   nRBC 0.0 0.0 - 0.2 %   Neutrophils Relative % 80 %   Neutro Abs 7.6 1.7 - 7.7 K/uL   Lymphocytes Relative 6 %   Lymphs Abs 0.5 (L) 0.7 - 4.0 K/uL   Monocytes Relative 9 %   Monocytes Absolute 0.9 0.1 - 1.0 K/uL   Eosinophils Relative 3 %   Eosinophils Absolute 0.2 0.0 - 0.5 K/uL   Basophils Relative 1 %   Basophils Absolute 0.1 0.0 - 0.1 K/uL   Immature Granulocytes 1 %   Abs Immature Granulocytes 0.09 (H) 0.00 - 0.07 K/uL  Comprehensive metabolic panel     Status: Abnormal   Collection Time: 05/22/22 10:20 PM  Result Value Ref Range   Sodium 131 (L) 135 - 145 mmol/L   Potassium 3.6 3.5 - 5.1 mmol/L   Chloride 96 (L) 98 - 111 mmol/L   CO2 24 22 - 32 mmol/L   Glucose, Bld 344 (H) 70 - 99 mg/dL   BUN 26 (H) 8 - 23 mg/dL   Creatinine, Ser 2.80 (H) 0.44 - 1.00 mg/dL   Calcium 8.6 (L) 8.9 - 10.3 mg/dL   Total Protein 5.9 (L) 6.5 - 8.1 g/dL   Albumin 2.1 (L) 3.5 - 5.0 g/dL   AST 25 15 - 41 U/L   ALT 9 0 - 44 U/L   Alkaline Phosphatase 110 38 - 126 U/L   Total Bilirubin <0.1 (L) 0.3 - 1.2 mg/dL   GFR, Estimated 17 (L) >60 mL/min   Anion gap 11 5 - 15  Type and screen [facility]  [place]     Status: None (Preliminary result)   Collection Time: 05/22/22 10:20 PM  Result Value Ref Range   ABO/RH(D) A POS    Antibody Screen POS    Sample Expiration 05/25/2022,2359    Antibody Identification ANTI E    Weak D NOT NEEDED    DAT, IgG NEG    Unit Number [id number]     Blood Component Type RED CELLS,LR    Unit division 00    Status of Unit ISSUED    Transfusion Status OK TO TRANSFUSE    Crossmatch Result COMPATIBLE    Donor AG Type NEGATIVE FOR E ANTIGEN    Unit Number [id number]    Blood Component Type RED CELLS,LR    Unit division 00    Status of Unit ISSUED    Transfusion Status OK TO TRANSFUSE    Crossmatch Result COMPATIBLE    Donor AG Type NEGATIVE FOR E ANTIGEN    Unit Number [id number]    Blood Component Type RED CELLS,LR    Unit division 00    Status of Unit ISSUED    Transfusion Status OK TO TRANSFUSE    Crossmatch Result COMPATIBLE    Donor AG Type NEGATIVE FOR E ANTIGEN   Protime-INR     Status: Abnormal   Collection Time: 05/22/22 10:20 PM  Result Value Ref Range   Prothrombin Time 16.6 (H) 11.4 - 15.2 seconds   INR 1.4 (H) 0.8 - 1.2  APTT     Status: None   Collection Time: 05/22/22 10:20 PM  Result Value Ref Range   aPTT 29 24 - 36 seconds  I-stat chem 8, ED (not at [facility], [facility] or [facility])     Status: Abnormal   Collection Time: 05/22/22 10:53 PM  Result Value Ref Range   Sodium 133 (L) 135 - 145 mmol/L   Potassium 3.6 3.5 - 5.1 mmol/L   Chloride 94 (L) 98 - 111 mmol/L   BUN 28 (H) 8 - 23 mg/dL   Creatinine, Ser 2.70 (H) 0.44 - 1.00 mg/dL   Glucose, Bld 357 (H) 70 - 99 mg/dL   Calcium, Ion 1.09 (L) 1.15 - 1.40 mmol/L   TCO2 27 22 - 32 mmol/L   Hemoglobin 10.2 (L) 12.0 - 15.0 g/dL   HCT 30.0 (L) 36.0 - 46.0 %  Lactic acid, plasma     Status: None   Collection Time: 05/23/22  2:50 AM  Result Value Ref Range   Lactic Acid, Venous 1.5 0.5 - 1.9 mmol/L  Prepare RBC (crossmatch)     Status: None   Collection Time: 05/23/22  4:16 AM  Result Value Ref Range   Order Confirmation      ORDER PROCESSED BY BLOOD BANK Performed at [facility], [address] [place], [place] [address]   CBC with Differential/Platelet     Status: Abnormal   Collection Time: 05/23/22  4:24 AM  Result Value Ref Range   WBC 9.8 4.0 -  10.5 K/uL   RBC 3.03 (L) 3.87 - 5.11 MIL/uL   Hemoglobin 7.8 (L) 12.0 - 15.0 g/dL   HCT 25.9 (L) 36.0 - 46.0 %   MCV 85.5 80.0 - 100.0 fL   MCH 25.7 (L) 26.0 - 34.0 pg   MCHC 30.1 30.0 - 36.0 g/dL   RDW 19.1 (H) 11.5 - 15.5 %   Platelets 258 150 - 400 K/uL   nRBC 0.0 0.0 - 0.2 %   Neutrophils Relative % 80 %  Neutro Abs 7.8 (H) 1.7 - 7.7 K/uL   Lymphocytes Relative 8 %   Lymphs Abs 0.8 0.7 - 4.0 K/uL   Monocytes Relative 8 %   Monocytes Absolute 0.8 0.1 - 1.0 K/uL   Eosinophils Relative 3 %   Eosinophils Absolute 0.3 0.0 - 0.5 K/uL   Basophils Relative 0 %   Basophils Absolute 0.0 0.0 - 0.1 K/uL   Immature Granulocytes 1 %   Abs Immature Granulocytes 0.11 (H) 0.00 - 0.07 K/uL  CBG monitoring, ED     Status: Abnormal   Collection Time: 05/23/22  4:47 AM  Result Value Ref Range   Glucose-Capillary 426 (H) 70 - 99 mg/dL  Glucose, capillary     Status: Abnormal   Collection Time: 05/23/22  9:28 AM  Result Value Ref Range   Glucose-Capillary 458 (H) 70 - 99 mg/dL   *Note: Due to a large number of results and/or encounters for the requested time period, some results have not been displayed. A complete set of results can be found in Results Review.     CT Angio Abd/Pel W and/or Wo Contrast  Result Date: 05/23/2022 CLINICAL DATA:  Ongoing lower GI bleed. EXAM: CTA ABDOMEN AND PELVIS WITHOUT AND WITH CONTRAST TECHNIQUE: Multidetector CT imaging of the abdomen and pelvis was performed using the standard protocol during bolus administration of intravenous contrast. Multiplanar reconstructed images and MIPs were obtained and reviewed to evaluate the vascular anatomy. RADIATION DOSE REDUCTION: This exam was performed according to the departmental dose-optimization program which includes automated exposure control, adjustment of the mA and/or kV according to patient size and/or use of iterative reconstruction technique. CONTRAST:  158m OMNIPAQUE IOHEXOL 350 MG/ML SOLN COMPARISON:  CTA  abdomen and pelvis earlier today at 12:53 a.m., CT abdomen pelvis without contrast 03/27/2022 and 08/22/2020. FINDINGS: VASCULAR Aorta: Moderate calcific plaque. No aneurysm, dissection or stenosis. Celiac: Moderate calcification noted without flow-limiting celiac stenosis, with circumferential calcifications in the branch arteries and extensive small branch arterial calcifications within the spleen and central liver. SMA: There is moderate calcification proximal to the inflection point without stenosis, circumferential calcification distal to the inflection point. No major-vessel stenosis. Renals: There are calcifications moderately in both proximal renal arteries but no hemodynamically significant (greater than 50%) stenosis. Calcifications continue into the hila and parenchyma along the branch arteries. IMA: Circumferentially calcified with severe origin stenosis. Flow is otherwise seen as previously. Inflow: No interval change from earlier today. Calcifications are greatest in the common iliac arteries and internal iliac arteries, but no stenosis greater than 50% is seen. There is only mild calcification in the external iliac arteries no stenosis. Proximal Outflow: The proximal outflow vessels demonstrate bilateral calcifications without flow-limiting stenoses. Veins: Patent. Review of the MIP images confirms the above findings. NON-VASCULAR Lower chest: No acute abnormality. Hepatobiliary: Scattered calcified granulomas. Capsular nodularity suggesting at least early cirrhosis with mild hepatic steatosis. No mass enhancement. There is cholelithiasis without bile duct dilatation or cholecystitis. Pancreas: Diffusely atrophic.  No other focal abnormality. Spleen: Mildly enlarged with scattered calcified granulomas, 15 cm AP. No focal abnormality. Adrenals/Urinary Tract: No new abnormality. Extensive renovascular calcifications. Stomach/Bowel: Wall thickening of the distal sigmoid colon and rectum is again noted  with adjacent stranding. In contrast to the earlier study today, there is now an irregular contrast blush into the lumen from the left dorsal aspect of the distal rectal wall on arterial phase series 8 axial images 192-201, with further diffusion of leaked contrast into the rectal content on venous  phase series 10 axial images 133-191. The contrast blush in the arterial phase is somewhat serpiginous with a least 1 and possibly 2 sites of active leakage. Upstream diverticulosis and constipation are redemonstrated. Lymphatic: No adenopathy. Reproductive: Status post hysterectomy. No adnexal masses. Other: Mild body wall edema. Minimal deep pelvic ascites is similar. Musculoskeletal: Osteopenia and degenerative change. IMPRESSION: 1. There is an irregular, somewhat serpiginous contrast blush into the distal rectum from the left dorsal wall on the arterial phase with further diffusion of the leaked contrast into the rectal content on the venous phase. Findings are consistent with active GI bleeding. 2. Extensive vascular calcifications, especially of small intraorgan branch arteries. 3. Wall thickening of the distal sigmoid colon and rectum with adjacent stranding. Findings consistent with proctocolitis. 4. Upstream diverticulosis and constipation. 5. Cirrhotic liver morphology with mild splenomegaly. The main portal vein is normal caliber. 6. Cholelithiasis. 7. Mild body wall edema and minimal pelvic ascites. 8. Osteopenia and degenerative change. 9. Aortic atherosclerosis. 10. Additional redemonstrated findings described above as well as earlier today. 11. These results will be called to the ordering clinician or representative by the radiologist assistant, and communication documented in the PACS or [place] dashboard. Aortic Atherosclerosis (ICD10-I70.0). Electronically Signed   By: Telford Nab M.D.   On: 05/23/2022 06:28   CT Angio Abd/Pel W and/or Wo Contrast  Result Date: 05/23/2022 CLINICAL DATA:  Lower GI  bleed.  Bloody diarrhea x4 days. EXAM: CTA ABDOMEN AND PELVIS WITHOUT AND WITH CONTRAST TECHNIQUE: Multidetector CT imaging of the abdomen and pelvis was performed using the standard protocol during bolus administration of intravenous contrast. Multiplanar reconstructed images and MIPs were obtained and reviewed to evaluate the vascular anatomy. RADIATION DOSE REDUCTION: This exam was performed according to the departmental dose-optimization program which includes automated exposure control, adjustment of the mA and/or kV according to patient size and/or use of iterative reconstruction technique. CONTRAST:  124m OMNIPAQUE IOHEXOL 350 MG/ML SOLN COMPARISON:  CT of abdomen and pelvis studies, both without contrast, dated 03/27/2022 and 08/22/2020. There is also an MRCP from 02/01/2022. FINDINGS: VASCULAR Aorta: There are moderate patchy calcific plaques without aneurysm, flow-limiting stenosis or dissection. Celiac: There are patchy calcific plaques of the celiac artery and its major branch arteries. Ostial calcific plaques cause 40% origin stenosis but there is no other focal stenosis, no dissection or aneurysm. There are extensive calcifications in the hepatic and splenic parenchymal small arteries. SMA: There are moderate calcific plaques, including circumferential calcifications distal to the vessel inflection point, but no flow-limiting major-vessel stenosis. Renals: There are calcific plaques, which continue to the renal hila but no flow-limiting or greater than 50% major vessel stenosis is seen. Both renal arteries are single. IMA: There are circumferential vessel calcifications. There is high-grade vessel origin stenosis but no other visible focal stenosis. Inflow: There are moderate calcifications in the common iliac arteries but no hemodynamically significant or greater than 50% stenosis is seen. There are circumferential calcifications in the internal iliac arteries but no flow-limiting stenosis. There is  mild scattered calcification in the external iliac arteries without significant stenosis. Proximal Outflow: There are calcifications in the common femoral and proximal superficial femoral arteries and of the visualized deep femoral arteries, without flow-limiting stenosis. Veins: Patent.  The main portal vein is normal in caliber. Review of the MIP images confirms the above findings. NON-VASCULAR Lower chest: There is mild cardiomegaly. There is calcification in the right coronary artery. The intraventricular blood pool is low in density on the noncontrast scan  consistent with anemia. Lung bases are clear. Right pleural effusion noted previously has cleared. Hepatobiliary: The liver is 18 cm length mildly steatotic with scattered calcified granulomas. There is no mass enhancement. Some images suggest capsular lobulation which could be due to early cirrhosis. There are stones in the gallbladder but no wall thickening or biliary prominence. Pancreas: Moderately atrophic without inflammatory changes, mass enhancement or ductal dilatation. Spleen: Mildly enlarged, with multiple calcified granulomas, unchanged in size at 15 cm AP. No mass is seen. Adrenals/Urinary Tract: There is no adrenal mass. There is mild bilateral renal volume loss likely vascular etiology. There is no mass enhancement. There are small right parapelvic cysts which are unchanged with no follow-up required. Extensive renovascular calcifications extend to the hila. There is no urinary stone or obstruction. There has no bladder thickening. There previously was air in the bladder which is not seen today. Stomach/Bowel: There is increased antral gastric fold thickening without inflammatory changes or penetrating ulcer. The unopacified small bowel is unremarkable and there is a normal appendix. There is moderate stool retention in the ascending and transverse colon. There are colonic diverticula most advanced in the sigmoid segment. There is wall  thickening and stranding along the distal sigmoid colon and rectum consistent with proctocolitis, but no arterial or venous contrast leakage is seen in the lumen. Lymphatic: No adenopathy is seen. Reproductive: Uterus and bilateral adnexa are unremarkable. Other: There is mild body wall anasarca but less pronounced than on 04-07-22. There is minimal ascites in the posterior deep pelvis but the abdominal ascites noted previously has cleared. There is no incarcerated hernia. No bowel pneumatosis is seen and no free air or free hemorrhage. Musculoskeletal: Advanced marginal osteophytosis thoracic and lumbar spine. 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 Mild osteopenia. IMPRESSION: 1. No evidence of arterial or venous contrast leakage into the bowel lumen. 2. Extensive vascular calcifications, particularly the small intraorgan parenchymal branch arteries including renovascular. 3. 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 4. Major branch arteries otherwise without flow-limiting (or greater than 50%) stenoses. 5. Cardiomegaly with calcific CAD. 6. Anemia. 7. Proctocolitis involving the distal sigmoid colon and rectum, likely infectious or inflammatory. No pneumatosis or portal venous gas. Ischemic etiology not strictly excluded. 8. Diverticulosis without evidence of diverticulitis.  Constipation. 9. Mild hepatic steatosis with possible early cirrhosis. Mild splenomegaly. 10. Cholelithiasis. 11. Mild body wall anasarca but less pronounced than on 2022-04-07. 12. Minimal ascites in the posterior deep pelvis. 13. Osteopenia and degenerative change. 14. Increased antral gastric fold thickening which could be due to nondistention or gastritis. 15. Aortic atherosclerosis. Aortic Atherosclerosis (ICD10-I70.0). Electronically Signed   By: Telford Nab M.D.   On: 05/23/2022 01:47    ROS:  As stated above in the HPI otherwise negative.  Blood pressure (!)  146/42, pulse 90, temperature 98 F (36.7 C), temperature source Temporal, resp. rate 12, weight 98 kg, SpO2 100 %.    PE: Gen: NAD, Alert and Oriented HEENT:  [place]/AT, EOMI Neck: Supple, no LAD Lungs: CTA Bilaterally CV: RRR without M/G/R ABD: Soft, NTND, +BS Ext: No C/C/E  Assessment/Plan: 1) Rectal bleeding site. 2) Anemia. 3) Hematochezia.   Consent was obtained from her husband.  A FFS will be performed.  Further recommendations pending the findings.  Adley Castello D 05/23/2022, 9:53 AM

## 2022-05-23 NOTE — ED Notes (Signed)
Patient was incontinent to bloody stool. Multiple clots within stool. Patient was cleaned and new linens were placed on the bed.

## 2022-05-23 NOTE — Telephone Encounter (Signed)
RECEIVED FAX FROM [employer] AND PT'S TRINTELLIX 10 MG TABLETS 2X/DAY HAS BEEN APPROVED. MJP,LPN

## 2022-05-24 ENCOUNTER — Encounter (HOSPITAL_COMMUNITY): Payer: Self-pay | Admitting: Anesthesiology

## 2022-05-24 ENCOUNTER — Encounter (HOSPITAL_COMMUNITY)
Admission: EM | Disposition: A | Discharge status: Skilled Nursing Facility | Payer: Self-pay | Source: Home / Self Care | Attending: Internal Medicine

## 2022-05-24 ENCOUNTER — Inpatient Hospital Stay (HOSPITAL_COMMUNITY): Payer: PPO | Admitting: Anesthesiology

## 2022-05-24 ENCOUNTER — Encounter (HOSPITAL_COMMUNITY): Payer: Self-pay | Admitting: Internal Medicine

## 2022-05-24 DIAGNOSIS — D638 Anemia in other chronic diseases classified elsewhere: Secondary | ICD-10-CM

## 2022-05-24 DIAGNOSIS — K626 Ulcer of anus and rectum: Secondary | ICD-10-CM

## 2022-05-24 DIAGNOSIS — Z794 Long term (current) use of insulin: Secondary | ICD-10-CM

## 2022-05-24 DIAGNOSIS — E039 Hypothyroidism, unspecified: Secondary | ICD-10-CM | POA: Diagnosis not present

## 2022-05-24 DIAGNOSIS — Z87891 Personal history of nicotine dependence: Secondary | ICD-10-CM

## 2022-05-24 DIAGNOSIS — E1151 Type 2 diabetes mellitus with diabetic peripheral angiopathy without gangrene: Secondary | ICD-10-CM

## 2022-05-24 DIAGNOSIS — K922 Gastrointestinal hemorrhage, unspecified: Secondary | ICD-10-CM | POA: Diagnosis not present

## 2022-05-24 HISTORY — PX: HEMOSTASIS CONTROL: SHX6838

## 2022-05-24 HISTORY — PX: FLEXIBLE SIGMOIDOSCOPY: SHX5431

## 2022-05-24 LAB — HEMOGLOBIN AND HEMATOCRIT, BLOOD
HCT: 26.6 % — ABNORMAL LOW (ref 36.0–46.0)
HCT: 26.5 % — ABNORMAL LOW (ref 36.0–46.0)
Hemoglobin: 8.6 g/dL — ABNORMAL LOW (ref 12.0–15.0)
Hemoglobin: 8.6 g/dL — ABNORMAL LOW (ref 12.0–15.0)

## 2022-05-24 LAB — CBC
HCT: 25 % — ABNORMAL LOW (ref 36.0–46.0)
HCT: 22.8 % — ABNORMAL LOW (ref 36.0–46.0)
Hemoglobin: 8.2 g/dL — ABNORMAL LOW (ref 12.0–15.0)
Hemoglobin: 7.4 g/dL — ABNORMAL LOW (ref 12.0–15.0)
MCH: 27.1 pg (ref 26.0–34.0)
MCH: 27 pg (ref 26.0–34.0)
MCHC: 32.8 g/dL (ref 30.0–36.0)
MCHC: 32.5 g/dL (ref 30.0–36.0)
MCV: 82.5 fL (ref 80.0–100.0)
MCV: 83.2 fL (ref 80.0–100.0)
Platelets: 212 10*3/uL (ref 150–400)
Platelets: 210 10*3/uL (ref 150–400)
RBC: 3.03 MIL/uL — ABNORMAL LOW (ref 3.87–5.11)
RBC: 2.74 MIL/uL — ABNORMAL LOW (ref 3.87–5.11)
RDW: 18.1 % — ABNORMAL HIGH (ref 11.5–15.5)
RDW: 18.3 % — ABNORMAL HIGH (ref 11.5–15.5)
WBC: 11 10*3/uL — ABNORMAL HIGH (ref 4.0–10.5)
WBC: 10.8 10*3/uL — ABNORMAL HIGH (ref 4.0–10.5)
nRBC: 0 % (ref 0.0–0.2)
nRBC: 0 % (ref 0.0–0.2)

## 2022-05-24 LAB — PHOSPHORUS: Phosphorus: 3.3 mg/dL (ref 2.5–4.6)

## 2022-05-24 LAB — GLUCOSE, CAPILLARY
Glucose-Capillary: 167 mg/dL — ABNORMAL HIGH (ref 70–99)
Glucose-Capillary: 153 mg/dL — ABNORMAL HIGH (ref 70–99)
Glucose-Capillary: 177 mg/dL — ABNORMAL HIGH (ref 70–99)
Glucose-Capillary: 155 mg/dL — ABNORMAL HIGH (ref 70–99)
Glucose-Capillary: 222 mg/dL — ABNORMAL HIGH (ref 70–99)

## 2022-05-24 LAB — BASIC METABOLIC PANEL
Anion gap: 7 (ref 5–15)
BUN: 40 mg/dL — ABNORMAL HIGH (ref 8–23)
CO2: 26 mmol/L (ref 22–32)
Calcium: 8.1 mg/dL — ABNORMAL LOW (ref 8.9–10.3)
Chloride: 98 mmol/L (ref 98–111)
Creatinine, Ser: 3.24 mg/dL — ABNORMAL HIGH (ref 0.44–1.00)
GFR, Estimated: 15 mL/min — ABNORMAL LOW (ref >60)
Glucose, Bld: 174 mg/dL — ABNORMAL HIGH (ref 70–99)
Potassium: 3.2 mmol/L — ABNORMAL LOW (ref 3.5–5.1)
Sodium: 131 mmol/L — ABNORMAL LOW (ref 135–145)

## 2022-05-24 LAB — PREPARE RBC (CROSSMATCH)

## 2022-05-24 SURGERY — SIGMOIDOSCOPY, FLEXIBLE
Anesthesia: Moderate Sedation

## 2022-05-24 SURGERY — EXAM UNDER ANESTHESIA
Anesthesia: General | Site: Rectum

## 2022-05-24 SURGERY — CANCELLED PROCEDURE

## 2022-05-24 MED ORDER — FENTANYL CITRATE (PF) 100 MCG/2ML IJ SOLN
INTRAMUSCULAR | Status: AC
  Filled 2022-05-24: qty 4

## 2022-05-24 MED ORDER — SODIUM CHLORIDE 0.9 % IV SOLN: INTRAVENOUS | Status: DC | PRN

## 2022-05-24 MED ORDER — SUCCINYLCHOLINE CHLORIDE 200 MG/10ML IV SOSY
PREFILLED_SYRINGE | INTRAVENOUS | Status: DC | PRN
  Administered 2022-05-24: 140 mg via INTRAVENOUS

## 2022-05-24 MED ORDER — MIDODRINE HCL 5 MG PO TABS
10.0000 mg | ORAL_TABLET | Freq: Three times a day (TID) | ORAL | Status: DC
  Administered 2022-05-24 – 2022-05-27 (×10): 10 mg via ORAL
  Filled 2022-05-24 (×10): qty 2

## 2022-05-24 MED ORDER — PHENYLEPHRINE 80 MCG/ML (10ML) SYRINGE FOR IV PUSH (FOR BLOOD PRESSURE SUPPORT)
PREFILLED_SYRINGE | INTRAVENOUS | Status: AC
  Filled 2022-05-24: qty 10

## 2022-05-24 MED ORDER — DOCUSATE SODIUM 100 MG PO CAPS
100.0000 mg | ORAL_CAPSULE | Freq: Two times a day (BID) | ORAL | Status: DC
  Administered 2022-05-26 – 2022-05-27 (×2): 100 mg via ORAL
  Filled 2022-05-24 (×5): qty 1

## 2022-05-24 MED ORDER — MIDAZOLAM HCL 2 MG/2ML IJ SOLN
4.0000 mg | Freq: Once | INTRAMUSCULAR | Status: AC
  Administered 2022-05-24: 1 mg via INTRAVENOUS
  Administered 2022-05-24: 2 mg via INTRAVENOUS
  Filled 2022-05-24: qty 4

## 2022-05-24 MED ORDER — DEXAMETHASONE SODIUM PHOSPHATE 10 MG/ML IJ SOLN
INTRAMUSCULAR | Status: AC
  Filled 2022-05-24: qty 1

## 2022-05-24 MED ORDER — ROCURONIUM BROMIDE 10 MG/ML (PF) SYRINGE
PREFILLED_SYRINGE | INTRAVENOUS | Status: AC
  Filled 2022-05-24: qty 10

## 2022-05-24 MED ORDER — LIDOCAINE 2% (20 MG/ML) 5 ML SYRINGE
INTRAMUSCULAR | Status: AC
  Filled 2022-05-24: qty 5

## 2022-05-24 MED ORDER — FENTANYL CITRATE (PF) 250 MCG/5ML IJ SOLN
INTRAMUSCULAR | Status: AC
  Filled 2022-05-24: qty 5

## 2022-05-24 MED ORDER — SUGAMMADEX SODIUM 200 MG/2ML IV SOLN: INTRAVENOUS | Status: DC | PRN

## 2022-05-24 MED ORDER — BUPIVACAINE-EPINEPHRINE (PF) 0.5% -1:200000 IJ SOLN
INTRAMUSCULAR | Status: DC | PRN
  Administered 2022-05-24: 20 mL

## 2022-05-24 MED ORDER — SODIUM CHLORIDE 0.9% IV SOLUTION: Freq: Once | INTRAVENOUS | Status: AC

## 2022-05-24 MED ORDER — 0.9 % SODIUM CHLORIDE (POUR BTL) OPTIME
TOPICAL | Status: DC | PRN
  Administered 2022-05-24: 1000 mL

## 2022-05-24 MED ORDER — MIDAZOLAM HCL (PF) 5 MG/ML IJ SOLN
INTRAMUSCULAR | Status: AC
  Filled 2022-05-24: qty 2

## 2022-05-24 MED ORDER — BUPIVACAINE-EPINEPHRINE (PF) 0.5% -1:200000 IJ SOLN
INTRAMUSCULAR | Status: AC
  Filled 2022-05-24: qty 30

## 2022-05-24 MED ORDER — POLYETHYLENE GLYCOL 3350 17 G PO PACK
17.0000 g | PACK | Freq: Every day | ORAL | Status: DC
  Administered 2022-05-26: 17 g via ORAL
  Filled 2022-05-24 (×2): qty 1

## 2022-05-24 MED ORDER — DIPHENHYDRAMINE HCL 12.5 MG/5ML PO ELIX
12.5000 mg | ORAL_SOLUTION | Freq: Three times a day (TID) | ORAL | Status: AC | PRN
  Administered 2022-05-24: 12.5 mg via ORAL
  Filled 2022-05-24: qty 5

## 2022-05-24 MED ORDER — ONDANSETRON HCL 4 MG/2ML IJ SOLN
INTRAMUSCULAR | Status: AC
  Filled 2022-05-24: qty 2

## 2022-05-24 MED ORDER — HYDROMORPHONE HCL 1 MG/ML IJ SOLN
0.5000 mg | INTRAMUSCULAR | Status: DC | PRN
  Administered 2022-05-24 – 2022-05-25 (×2): 0.5 mg via INTRAVENOUS
  Filled 2022-05-24 (×2): qty 0.5

## 2022-05-24 MED ORDER — ETOMIDATE 2 MG/ML IV SOLN
INTRAVENOUS | Status: DC | PRN
  Administered 2022-05-24: 12 mg via INTRAVENOUS

## 2022-05-24 MED ORDER — SUCCINYLCHOLINE CHLORIDE 200 MG/10ML IV SOSY
PREFILLED_SYRINGE | INTRAVENOUS | Status: AC
  Filled 2022-05-24: qty 10

## 2022-05-24 MED ORDER — ACETAMINOPHEN 10 MG/ML IV SOLN
1000.0000 mg | Freq: Once | INTRAVENOUS | Status: AC
  Administered 2022-05-24: 1000 mg via INTRAVENOUS
  Filled 2022-05-24: qty 100

## 2022-05-24 MED ORDER — LIDOCAINE 2% (20 MG/ML) 5 ML SYRINGE
INTRAMUSCULAR | Status: DC | PRN
  Administered 2022-05-24: 60 mg via INTRAVENOUS

## 2022-05-24 MED ORDER — FENTANYL CITRATE PF 50 MCG/ML IJ SOSY
200.0000 ug | PREFILLED_SYRINGE | Freq: Once | INTRAMUSCULAR | Status: AC
  Administered 2022-05-24: 50 ug via INTRAVENOUS
  Administered 2022-05-24 (×2): 25 ug via INTRAVENOUS
  Filled 2022-05-24: qty 4

## 2022-05-24 MED ORDER — ONDANSETRON HCL 4 MG/2ML IJ SOLN
INTRAMUSCULAR | Status: DC | PRN
  Administered 2022-05-24: 4 mg via INTRAVENOUS

## 2022-05-24 MED ORDER — SODIUM CHLORIDE 0.9 % IV SOLN: INTRAVENOUS | Status: DC

## 2022-05-24 MED ORDER — DEXAMETHASONE SODIUM PHOSPHATE 10 MG/ML IJ SOLN
INTRAMUSCULAR | Status: DC | PRN
  Administered 2022-05-24: 4 mg via INTRAVENOUS

## 2022-05-24 MED ORDER — PROPOFOL 10 MG/ML IV BOLUS
INTRAVENOUS | Status: AC
  Filled 2022-05-24: qty 20

## 2022-05-24 SURGICAL SUPPLY — 33 items
BAG COUNTER SPONGE SURGICOUNT (BAG) ×1 IMPLANT
BAG SPNG CNTER NS LX DISP (BAG) ×1
BNDG GAUZE DERMACEA FLUFF 4 (GAUZE/BANDAGES/DRESSINGS) IMPLANT
BNDG GZE DERMACEA 4 6PLY (GAUZE/BANDAGES/DRESSINGS) ×1
BRIEF MESH DISP 2XL (UNDERPADS AND DIAPERS) IMPLANT
COVER MAYO STAND STRL (DRAPES) ×1 IMPLANT
COVER SURGICAL LIGHT HANDLE (MISCELLANEOUS) ×1 IMPLANT
ELECT REM PT RETURN 9FT ADLT (ELECTROSURGICAL) ×1
ELECTRODE REM PT RTRN 9FT ADLT (ELECTROSURGICAL) ×1 IMPLANT
GAUZE SPONGE 4X4 12PLY STRL (GAUZE/BANDAGES/DRESSINGS) IMPLANT
GLOVE BIOGEL PI IND STRL 7.0 (GLOVE) ×1 IMPLANT
GLOVE SURG SS PI 7.0 STRL IVOR (GLOVE) ×1 IMPLANT
GOWN STRL REUS W/ TWL LRG LVL3 (GOWN DISPOSABLE) ×2 IMPLANT
GOWN STRL REUS W/TWL LRG LVL3 (GOWN DISPOSABLE) ×2
KIT BASIN OR (CUSTOM PROCEDURE TRAY) ×1 IMPLANT
KIT TURNOVER KIT B (KITS) ×1 IMPLANT
NDL 22X1.5 STRL (OR ONLY) (MISCELLANEOUS) ×1 IMPLANT
NEEDLE 22X1.5 STRL (OR ONLY) (MISCELLANEOUS) IMPLANT
NS IRRIG 1000ML POUR BTL (IV SOLUTION) ×1 IMPLANT
PACK GENERAL/GYN (CUSTOM PROCEDURE TRAY) IMPLANT
PACK LITHOTOMY IV (CUSTOM PROCEDURE TRAY) ×1 IMPLANT
PAD ARMBOARD 7.5X6 YLW CONV (MISCELLANEOUS) ×1 IMPLANT
PENCIL SMOKE EVACUATOR (MISCELLANEOUS) ×1 IMPLANT
POSITIONER HEAD PRONE TRACH (MISCELLANEOUS) IMPLANT
SPONGE HEMORRHOID 8X3CM (HEMOSTASIS) IMPLANT
SPONGE T-LAP 18X18 ~~LOC~~+RFID (SPONGE) ×1 IMPLANT
SURGILUBE 2OZ TUBE FLIPTOP (MISCELLANEOUS) ×1 IMPLANT
SUT VICRYL 3-0 CR8 SH (SUTURE) IMPLANT
SYR BULB EAR ULCER 3OZ GRN STR (SYRINGE) ×1 IMPLANT
TOWEL GREEN STERILE (TOWEL DISPOSABLE) ×1 IMPLANT
TOWEL GREEN STERILE FF (TOWEL DISPOSABLE) ×1 IMPLANT
TUBE CONNECTING 12X1/4 (SUCTIONS) ×1 IMPLANT
YANKAUER SUCT BULB TIP NO VENT (SUCTIONS) ×1 IMPLANT

## 2022-05-24 NOTE — Progress Notes (Signed)
Recurrent bleeding with Hgb drop, no hypotension. -Will take to OR for examination and hemostasis -discussed risks of injury to rectum, sphincters, need for additional procedures and recurrence. She showed understanding and wanted to proceed

## 2022-05-24 NOTE — Progress Notes (Addendum)
NAME:  Susan [name], MRN:  [id number], DOB:  1951/01/11, LOS: 1 ADMISSION DATE:  05/22/2022, CONSULTATION DATE: 05/23/2022 REFERRING MD:  Garald Balding, PA-C , CHIEF COMPLAINT: Blood per rectum  History of Present Illness:  71 year old female with end-stage renal disease on hemodialysis, coronary artery disease, paroxysmal A-fib on Eliquis, diabetes and hypertension was brought in the emergency department with complaint of abdominal pain and diarrhea for last few days.  Patient stated she was in her usual state of health but few days ago she started with dark bloody bowel movements initially it was 2-3 times a day but yesterday it happened 6-7 times and she felt weak so was brought to the emergency department.  Initially she was found to be hypotensive, requiring IV fluid with improvement in blood pressure, in the emergency department she had multiple large bowel movement containing clots.  GI was consulted they recommend CT angiogram abdomen and pelvis, initial CT angio was negative.  Patient is compliant with her Eliquis, she received Kcentra for anticoagulation reversal, as she was actively bleeding, PCCM was consulted for help evaluation and ICU admission Patient stated abdominal pain is better denies nausea, vomiting, fever or chills  Pertinent  Medical History   Past Medical History:  Diagnosis Date   Acute MI ([place]) 1999; 2007; 03/05/21   Anemia    hx   Anginal pain ([place])    Anxiety    ARF (acute renal failure) ([place]) 06/2017   [facility]   Arthritis    "generalized" (03/15/2014)   CAD (coronary artery disease)    MI in 2000 - MI  2007 - treated bare metal stent (no nuclear since then as 9/11)   Carotid artery disease ([facility])    CHF (congestive heart failure) ([place])    HFrEF 03/06/21   Chronic diastolic heart failure ([place])    a) ECHO (08/2013) EF 55-60% and RV function nl b) RHC (08/2013) RA 4, RV 30/5/7, PA 25/10 (16), PCWP 7, Fick CO/CI 6.3/2.7, PVR 1.5 WU, PA 61 and 66%    Daily headache    "~ every other day; since I fell in June" (03/15/2014)   Depression    Dyslipidemia    Dyspnea    uses oxygen qhs via [place]   ESRD (end stage renal disease) ([place])    Dialysis on Tues Thurs Sat   Exertional shortness of breath    History of kidney stones    HTN (hypertension)    Hypothyroidism    Neuropathy    Obesity    Osteoarthritis    PAF (paroxysmal atrial fibrillation) (H[facility])    Peripheral neuropathy    bilateral feet/hands   PONV (postoperative nausea and vomiting)    RBBB (right bundle branch block)    Old   Stroke ([place])    mini strokes   Syncope    likely due to low blood sugar   Tachycardia    Sinus tachycardia   Type II diabetes mellitus (HCC)    Type II, Lamar Sprinkles libre left upper arm. patient has omnipod insulin pump with Novolin R Insulin   Urinary incontinence    Venous insufficiency      Significant Hospital Events: Including procedures, antibiotic start and stop dates in addition to other pertinent events   05/23/22 Flex Sig, cautery/clipping rectal ulcers  Interim History / Subjective:  Doing better, abd pain improved post procedure.  Objective   Blood pressure (!) 112/46, pulse 78, temperature 98 F (36.7 C), temperature source Axillary, resp. rate 16,  weight 98 kg, SpO2 99 %.        Intake/Output Summary (Last 24 hours) at 05/24/2022 1003 Last data filed at 05/24/2022 0900 Gross per 24 hour  Intake 516.58 ml  Output 0 ml  Net 516.58 ml   Filed Weights   05/22/22 2215 05/24/22 0500  Weight: 98 kg 98 kg    Examination: Chronically ill woman in NAD +global anasarca Ext warm to touch Moves ext but weak Abd soft, less TTP in lower quadrants  Pressor needs improving  Resolved Hospital Problem list     Assessment & Plan:  Hemorrhagic shock 2/2 rectal ulcer bleeding post intervention- some lingering low Bps but H/H stable, hematochezia resolved ESRD on HD Afib on AC CAD DM2 HTN Muscular deconditioning Hx c diff- if  no diarrhea can dc PO vanc  - PT/OT - CBC to BID - Advance diet per GI - Start midodrine - Wean levophed for MAP 65 - iHD per nephrology - Continue to hold all AC and antiplatelets today - DNR  My cc time 31 mins Erskine Emery MD PCCM

## 2022-05-24 NOTE — Op Note (Signed)
Preoperative diagnosis: bleeding rectal ulcer  Postoperative diagnosis: same   Procedure: exam under anesthesia, oversewing of rectal ulcer  Surgeon: Gurney Maxin, M.D.  Asst: none  Anesthesia: GETA  Indications for procedure: Susan [name] is a 71 y.o. year old female with symptoms of rectal bleeding that continued to bleed after endoscopy. After discussing risks, benefits and alternatives, she was brought to the OR.  Description of procedure: The patient was brought into the operative suite. Anesthesia was administered with General endotracheal anesthesia. WHO checklist was applied. The patient was then placed in prone position. The area was prepped and draped in the usual sterile fashion.  Next, marcaine with epi was infused into the perianal tissue. A rectal retractor was placed and the rectal vault examined. A moderate amount of maroon stools were evacuated from the vault. The clip was identified on the left side of the rectal wall about 3 cm from the anal verge. The ulcer was just posterior to the clip. The ulcer was about 15 mm in size. There was no active bleeding. The mucosa around the ulcer was imbricated over the ulcer with 3-0 vicryl and injected with marcaine with epinephrine. The rectum was irrigated and appeared hemostatic. A gel foam was inserted into the rectal vault. Gauze was put over the anus. The patient awoke from anesthesia and was brought to the ICU in stable condition.  Findings: 1.5 cm rectal ulcer over the left and posterior rectal wall  Specimen: none  Implant: gel foam   Blood loss: 10 ml  Local anesthesia:  20 ml marcaine   Complications: none  Gurney Maxin, M.D. General, Bariatric, & Minimally Invasive Surgery [facility]

## 2022-05-24 NOTE — Consult Note (Signed)
Reason for Consult:rectal ulcer Referring Provider: Mable [name] is an 71 y.o. female.  HPI: 71 yo female with multiple medical problems has been having blood per rectum. She underwent flex sig with identification of rectal ulcer with active bleeding and hemostatic agent applied. CT showing calcified vessels and deemed ineligible for IR procedure.   Past Medical History:  Diagnosis Date   Acute MI ([place]) 1999; 2007; 03/05/21   Anemia    hx   Anginal pain (HCC)    Anxiety    ARF (acute renal failure) ([place]) 06/2017   [facility]   Arthritis    "generalized" (03/15/2014)   CAD (coronary artery disease)    MI in 2000 - MI  2007 - treated bare metal stent (no nuclear since then as 9/11)   Carotid artery disease (HCC)    CHF (congestive heart failure) ([place])    HFrEF 03/06/21   Chronic diastolic heart failure ([place])    a) ECHO (08/2013) EF 55-60% and RV function nl b) RHC (08/2013) RA 4, RV 30/5/7, PA 25/10 (16), PCWP 7, Fick CO/CI 6.3/2.7, PVR 1.5 WU, PA 61 and 66%   Daily headache    "~ every other day; since I fell in June" (03/15/2014)   Depression    Dyslipidemia    Dyspnea    uses oxygen qhs via [place]   ESRD (end stage renal disease) ([place])    Dialysis on Tues Thurs Sat   Exertional shortness of breath    History of kidney stones    HTN (hypertension)    Hypothyroidism    Neuropathy    Obesity    Osteoarthritis    PAF (paroxysmal atrial fibrillation) (HCC)    Peripheral neuropathy    bilateral feet/hands   PONV (postoperative nausea and vomiting)    RBBB (right bundle branch block)    Old   Stroke ([place])    mini strokes   Syncope    likely due to low blood sugar   Tachycardia    Sinus tachycardia   Type II diabetes mellitus (HCC)    Type II, Lamar Sprinkles libre left upper arm. patient has omnipod insulin pump with Novolin R Insulin   Urinary incontinence    Venous insufficiency     Past Surgical History:  Procedure Laterality Date   ABDOMINAL  HYSTERECTOMY  1980's   AMPUTATION Right 02/24/2018   Procedure: RIGHT FOOT GREAT TOE AND 2ND TOE AMPUTATION;  Surgeon: Newt Minion, MD;  Location: [facility];  Service: Orthopedics;  Laterality: Right;   AMPUTATION Right 04/30/2018   Procedure: RIGHT TRANSMETATARSAL AMPUTATION;  Surgeon: Newt Minion, MD;  Location: [facility];  Service: Orthopedics;  Laterality: Right;   AMPUTATION Right 05/02/2022   Procedure: RIGHT BELOW KNEE AMPUTATION;  Surgeon: Newt Minion, MD;  Location: [facility];  Service: Orthopedics;  Laterality: Right;   AV FISTULA PLACEMENT Left 04/02/2022   Procedure: LEFT ARM ARTERIOVENOUS (AV) FISTULA CREATION;  Surgeon: Serafina Mitchell, MD;  Location: [facility];  Service: Vascular;  Laterality: Left;  PERIPHERAL NERVE BLOCK   BIOPSY  05/27/2020   Procedure: BIOPSY;  Surgeon: [name], DO;  [facility];  Service: Endoscopy;;   CATARACT EXTRACTION, BILATERAL Bilateral ?2013   COLONOSCOPY W/ POLYPECTOMY     COLONOSCOPY WITH PROPOFOL N/A 03/13/2019   Procedure: COLONOSCOPY WITH PROPOFOL;  Surgeon: Jerene Bears, MD;  Location: [facility];  Service: Gastroenterology;  Laterality: N/A;   CORONARY ANGIOPLASTY WITH STENT PLACEMENT  1999; 2007   "1 + 1"   ERCP N/A 02/03/2022   Procedure: ENDOSCOPIC RETROGRADE CHOLANGIOPANCREATOGRAPHY (ERCP);  Surgeon: Carol Ada, MD;  Location: [facility];  Service: Gastroenterology;  Laterality: N/A;   ESOPHAGOGASTRODUODENOSCOPY (EGD) WITH PROPOFOL N/A 03/13/2019   Procedure: ESOPHAGOGASTRODUODENOSCOPY (EGD) WITH PROPOFOL;  Surgeon: Jerene Bears, MD;  [facility];  Service: Gastroenterology;  Laterality: N/A;   ESOPHAGOGASTRODUODENOSCOPY (EGD) WITH PROPOFOL N/A 05/27/2020   Procedure: ESOPHAGOGASTRODUODENOSCOPY (EGD) WITH PROPOFOL;  Surgeon: [name], DO;  [facility];  Service: Endoscopy;  Laterality: N/A;   EYE SURGERY Bilateral    lazer   HEMOSTASIS CLIP PLACEMENT  03/13/2019   Procedure: HEMOSTASIS  CLIP PLACEMENT;  Surgeon: Jerene Bears, MD;  Location: [facility];  Service: Gastroenterology;;   I & D EXTREMITY Left 05/05/2022   Procedure: IRRIGATION AND DEBRIDEMENT LEFT ARM AV FISTULA;  Surgeon: Marty Heck, MD;  Location: [facility];  Service: Vascular;  Laterality: Left;   INSERTION OF DIALYSIS CATHETER Right 04/02/2022   Procedure: INSERTION OF TUNNELED DIALYSIS CATHETER;  Surgeon: Serafina Mitchell, MD;  Location: [facility];  Service: Vascular;  Laterality: Right;   KNEE ARTHROSCOPY Left 10/25/2006   POLYPECTOMY  03/13/2019   Procedure: POLYPECTOMY;  Surgeon: Jerene Bears, MD;  Location: [facility];  Service: Gastroenterology;;   REMOVAL OF STONES  02/03/2022   Procedure: REMOVAL OF STONES;  Surgeon: Carol Ada, MD;  Location: [facility];  Service: Gastroenterology;;   RIGHT HEART CATH N/A 07/24/2017   Procedure: RIGHT HEART CATH;  Surgeon: Jolaine Artist, MD;  Location: [facility];  Service: Cardiovascular;  Laterality: N/A;   RIGHT HEART CATHETERIZATION N/A 09/22/2013   Procedure: RIGHT HEART CATH;  Surgeon: Jolaine Artist, MD;  Location: [facility];  Service: Cardiovascular;  Laterality: N/A;   SHOULDER ARTHROSCOPY WITH OPEN ROTATOR CUFF REPAIR Right 03/14/2014   Procedure: RIGHT SHOULDER ARTHROSCOPY WITH BICEPS RELEASE, OPEN SUBSCAPULA REPAIR, OPEN SUPRASPINATUS REPAIR.;  Surgeon: Meredith Pel, MD;  Location: [facility];  Service: Orthopedics;  Laterality: Right;   SPHINCTEROTOMY  02/03/2022   Procedure: SPHINCTEROTOMY;  Surgeon: Carol Ada, MD;  Location: [facility];  Service: Gastroenterology;;   TEE WITHOUT CARDIOVERSION N/A 02/04/2022   Procedure: TRANSESOPHAGEAL ECHOCARDIOGRAM (TEE);  Surgeon: Jolaine Artist, MD;  Location: [facility] ENDOSCOPY;  Service: Cardiovascular;  Laterality: N/A;   TOE AMPUTATION Right 02/24/2018   GREAT TOE AND 2ND TOE AMPUTATION   TUBAL LIGATION  5's    Family History  Problem Relation Age of Onset   Heart attack  Mother 19    Social History:  reports that she quit smoking about 24 years ago. Her smoking use included cigarettes. She has a 96.00 pack-year smoking history. She has never used smokeless tobacco. She reports that she does not currently use alcohol. She reports that she does not use drugs.  Allergies:  Allergies  Allergen Reactions   Keflex [Cephalexin] Diarrhea   Codeine Nausea And Vomiting and Other (See Comments)    Medications: I have reviewed the patient's current medications.  Results for orders placed or performed during the hospital encounter of 05/22/22 (from the past 48 hour(s))  CBG monitoring, ED     Status: Abnormal   Collection Time: 05/22/22 10:06 PM  Result Value Ref Range   Glucose-Capillary 342 (H) 70 - 99 mg/dL    Comment: Glucose reference range applies only to samples taken after fasting for at least 8 hours.  Prepare RBC (crossmatch)  Status: None   Collection Time: 05/22/22 10:19 PM  Result Value Ref Range   Order Confirmation      ORDER PROCESSED BY BLOOD BANK Performed at [facility], [place] [address], [place], [place] [address]   CBC with Differential     Status: Abnormal   Collection Time: 05/22/22 10:20 PM  Result Value Ref Range   WBC 9.4 4.0 - 10.5 K/uL   RBC 3.77 (L) 3.87 - 5.11 MIL/uL   Hemoglobin 9.0 (L) 12.0 - 15.0 g/dL   HCT 32.1 (L) 36.0 - 46.0 %   MCV 85.1 80.0 - 100.0 fL   MCH 23.9 (L) 26.0 - 34.0 pg   MCHC 28.0 (L) 30.0 - 36.0 g/dL   RDW 19.9 (H) 11.5 - 15.5 %   Platelets 265 150 - 400 K/uL   nRBC 0.0 0.0 - 0.2 %   Neutrophils Relative % 80 %   Neutro Abs 7.6 1.7 - 7.7 K/uL   Lymphocytes Relative 6 %   Lymphs Abs 0.5 (L) 0.7 - 4.0 K/uL   Monocytes Relative 9 %   Monocytes Absolute 0.9 0.1 - 1.0 K/uL   Eosinophils Relative 3 %   Eosinophils Absolute 0.2 0.0 - 0.5 K/uL   Basophils Relative 1 %   Basophils Absolute 0.1 0.0 - 0.1 K/uL   Immature Granulocytes 1 %   Abs Immature Granulocytes 0.09 (H) 0.00 - 0.07 K/uL     Comment: Performed at [facility] [address], [place], [place] [address]  Comprehensive metabolic panel     Status: Abnormal   Collection Time: 05/22/22 10:20 PM  Result Value Ref Range   Sodium 131 (L) 135 - 145 mmol/L   Potassium 3.6 3.5 - 5.1 mmol/L   Chloride 96 (L) 98 - 111 mmol/L   CO2 24 22 - 32 mmol/L   Glucose, Bld 344 (H) 70 - 99 mg/dL    Comment: Glucose reference range applies only to samples taken after fasting for at least 8 hours.   BUN 26 (H) 8 - 23 mg/dL   Creatinine, Ser 2.80 (H) 0.44 - 1.00 mg/dL   Calcium 8.6 (L) 8.9 - 10.3 mg/dL   Total Protein 5.9 (L) 6.5 - 8.1 g/dL   Albumin 2.1 (L) 3.5 - 5.0 g/dL   AST 25 15 - 41 U/L   ALT 9 0 - 44 U/L   Alkaline Phosphatase 110 38 - 126 U/L   Total Bilirubin <0.1 (L) 0.3 - 1.2 mg/dL   GFR, Estimated 17 (L) >60 mL/min    Comment: (NOTE) Calculated using the CKD-EPI Creatinine Equation (2021)    Anion gap 11 5 - 15    Comment: Performed at [facility], [place] [address], [place], [place] [address]  Type and screen [place]     Status: None   Collection Time: 05/22/22 10:20 PM  Result Value Ref Range   ABO/RH(D) A POS    Antibody Screen POS    Sample Expiration 05/25/2022,2359    Antibody Identification ANTI E    Weak D NOT NEEDED    DAT, IgG NEG    Unit Number [id number]    Blood Component Type RED CELLS,LR    Unit division 00    Status of Unit ISSUED,FINAL    Transfusion Status OK TO TRANSFUSE    Crossmatch Result COMPATIBLE    Donor AG Type NEGATIVE FOR E ANTIGEN    Unit Number [id number]    Blood Component  Type RED CELLS,LR    Unit division 00    Status of Unit ISSUED,FINAL    Transfusion Status OK TO TRANSFUSE    Crossmatch Result COMPATIBLE    Donor AG Type NEGATIVE FOR E ANTIGEN    Unit Number [id number]    Blood Component Type RED CELLS,LR    Unit division 00    Status of Unit ISSUED,FINAL    Transfusion Status OK TO TRANSFUSE    Crossmatch Result  COMPATIBLE    Donor AG Type NEGATIVE FOR E ANTIGEN   Protime-INR     Status: Abnormal   Collection Time: 05/22/22 10:20 PM  Result Value Ref Range   Prothrombin Time 16.6 (H) 11.4 - 15.2 seconds   INR 1.4 (H) 0.8 - 1.2    Comment: (NOTE) INR goal varies based on device and disease states. Performed at [facility], [place] [address] [place], [place] [address]   APTT     Status: None   Collection Time: 05/22/22 10:20 PM  Result Value Ref Range   aPTT 29 24 - 36 seconds    Comment: Performed at [facility] [address], [place], [place] [address]  I-stat chem 8, ED (not at [facility], [facility] or [facility])     Status: Abnormal   Collection Time: 05/22/22 10:53 PM  Result Value Ref Range   Sodium 133 (L) 135 - 145 mmol/L   Potassium 3.6 3.5 - 5.1 mmol/L   Chloride 94 (L) 98 - 111 mmol/L   BUN 28 (H) 8 - 23 mg/dL   Creatinine, Ser 2.70 (H) 0.44 - 1.00 mg/dL   Glucose, Bld 357 (H) 70 - 99 mg/dL    Comment: Glucose reference range applies only to samples taken after fasting for at least 8 hours.   Calcium, Ion 1.09 (L) 1.15 - 1.40 mmol/L   TCO2 27 22 - 32 mmol/L   Hemoglobin 10.2 (L) 12.0 - 15.0 g/dL   HCT 30.0 (L) 36.0 - 46.0 %  Lactic acid, plasma     Status: None   Collection Time: 05/23/22  2:50 AM  Result Value Ref Range   Lactic Acid, Venous 1.5 0.5 - 1.9 mmol/L    Comment: Performed at [facility] [address], [place], [place] [address]  Prepare RBC (crossmatch)     Status: None   Collection Time: 05/23/22  4:16 AM  Result Value Ref Range   Order Confirmation      ORDER PROCESSED BY BLOOD BANK Performed at [facility], [place] [address], [place], [place] [address]   CBC with Differential/Platelet     Status: Abnormal   Collection Time: 05/23/22  4:24 AM  Result Value Ref Range   WBC 9.8 4.0 - 10.5 K/uL   RBC 3.03 (L) 3.87 - 5.11 MIL/uL   Hemoglobin 7.8 (L) 12.0 - 15.0 g/dL   HCT 25.9 (L) 36.0 - 46.0 %   MCV 85.5 80.0 - 100.0 fL   MCH 25.7 (L) 26.0 -  34.0 pg   MCHC 30.1 30.0 - 36.0 g/dL   RDW 19.1 (H) 11.5 - 15.5 %   Platelets 258 150 - 400 K/uL   nRBC 0.0 0.0 - 0.2 %   Neutrophils Relative % 80 %   Neutro Abs 7.8 (H) 1.7 - 7.7 K/uL   Lymphocytes Relative 8 %   Lymphs Abs 0.8 0.7 - 4.0 K/uL   Monocytes Relative 8 %   Monocytes Absolute 0.8 0.1 - 1.0 K/uL  Eosinophils Relative 3 %   Eosinophils Absolute 0.3 0.0 - 0.5 K/uL   Basophils Relative 0 %   Basophils Absolute 0.0 0.0 - 0.1 K/uL   Immature Granulocytes 1 %   Abs Immature Granulocytes 0.11 (H) 0.00 - 0.07 K/uL    Comment: Performed at [facility] [address], [place], [place] [address]  CBG monitoring, ED     Status: Abnormal   Collection Time: 05/23/22  4:47 AM  Result Value Ref Range   Glucose-Capillary 426 (H) 70 - 99 mg/dL    Comment: Glucose reference range applies only to samples taken after fasting for at least 8 hours.  Glucose, capillary     Status: Abnormal   Collection Time: 05/23/22  9:28 AM  Result Value Ref Range   Glucose-Capillary 458 (H) 70 - 99 mg/dL    Comment: Glucose reference range applies only to samples taken after fasting for at least 8 hours.  Glucose, capillary     Status: Abnormal   Collection Time: 05/23/22 10:14 AM  Result Value Ref Range   Glucose-Capillary 486 (H) 70 - 99 mg/dL    Comment: Glucose reference range applies only to samples taken after fasting for at least 8 hours.  CBG monitoring, ED     Status: Abnormal   Collection Time: 05/23/22 11:27 AM  Result Value Ref Range   Glucose-Capillary 425 (H) 70 - 99 mg/dL    Comment: Glucose reference range applies only to samples taken after fasting for at least 8 hours.  CBG monitoring, ED     Status: Abnormal   Collection Time: 05/23/22 12:36 PM  Result Value Ref Range   Glucose-Capillary 414 (H) 70 - 99 mg/dL    Comment: Glucose reference range applies only to samples taken after fasting for at least 8 hours.  CBG monitoring, ED     Status: Abnormal   Collection Time:  05/23/22  1:15 PM  Result Value Ref Range   Glucose-Capillary 397 (H) 70 - 99 mg/dL    Comment: Glucose reference range applies only to samples taken after fasting for at least 8 hours.  CBG monitoring, ED     Status: Abnormal   Collection Time: 05/23/22  2:25 PM  Result Value Ref Range   Glucose-Capillary 341 (H) 70 - 99 mg/dL    Comment: Glucose reference range applies only to samples taken after fasting for at least 8 hours.  MRSA Next Gen by PCR, Nasal     Status: None   Collection Time: 05/23/22  4:12 PM   Specimen: Nasal Mucosa; Nasal Swab  Result Value Ref Range   MRSA by PCR Next Gen NOT DETECTED NOT DETECTED    Comment: (NOTE) The GeneXpert MRSA Assay (FDA approved for NASAL specimens only), is one component of a comprehensive MRSA colonization surveillance program. It is not intended to diagnose MRSA infection nor to guide or monitor treatment for MRSA infections. Test performance is not FDA approved in patients less than 58 years old. Performed at [facility], [place] [address], [place], [place] [address]   CBC     Status: Abnormal   Collection Time: 05/23/22  6:11 PM  Result Value Ref Range   WBC 13.3 (H) 4.0 - 10.5 K/uL   RBC 3.25 (L) 3.87 - 5.11 MIL/uL   Hemoglobin 8.8 (L) 12.0 - 15.0 g/dL   HCT 26.9 (L) 36.0 - 46.0 %   MCV 82.8 80.0 - 100.0 fL   MCH 27.1 26.0 - 34.0  pg   MCHC 32.7 30.0 - 36.0 g/dL   RDW 17.7 (H) 11.5 - 15.5 %   Platelets 294 150 - 400 K/uL   nRBC 0.0 0.0 - 0.2 %    Comment: Performed at [name], [place] [address], [place], [place] [address]  Glucose, capillary     Status: Abnormal   Collection Time: 05/23/22  8:12 PM  Result Value Ref Range   Glucose-Capillary 234 (H) 70 - 99 mg/dL    Comment: Glucose reference range applies only to samples taken after fasting for at least 8 hours.  Hemoglobin and hematocrit, blood     Status: Abnormal   Collection Time: 05/23/22 10:11 PM  Result Value Ref Range   Hemoglobin 8.2 (L) 12.0 - 15.0  g/dL   HCT 26.1 (L) 36.0 - 46.0 %    Comment: Performed at [facility], [place] [address], [place], [place] [address]  Glucose, capillary     Status: Abnormal   Collection Time: 05/23/22 11:40 PM  Result Value Ref Range   Glucose-Capillary 148 (H) 70 - 99 mg/dL    Comment: Glucose reference range applies only to samples taken after fasting for at least 8 hours.  Glucose, capillary     Status: Abnormal   Collection Time: 05/24/22  4:01 AM  Result Value Ref Range   Glucose-Capillary 167 (H) 70 - 99 mg/dL    Comment: Glucose reference range applies only to samples taken after fasting for at least 8 hours.  Hemoglobin and hematocrit, blood     Status: Abnormal   Collection Time: 05/24/22  4:09 AM  Result Value Ref Range   Hemoglobin 8.6 (L) 12.0 - 15.0 g/dL   HCT 26.6 (L) 36.0 - 46.0 %    Comment: Performed at [facility], [place] [address] [place], [place] [address]  Basic metabolic panel     Status: Abnormal   Collection Time: 05/24/22  4:09 AM  Result Value Ref Range   Sodium 131 (L) 135 - 145 mmol/L   Potassium 3.2 (L) 3.5 - 5.1 mmol/L   Chloride 98 98 - 111 mmol/L   CO2 26 22 - 32 mmol/L   Glucose, Bld 174 (H) 70 - 99 mg/dL    Comment: Glucose reference range applies only to samples taken after fasting for at least 8 hours.   BUN 40 (H) 8 - 23 mg/dL   Creatinine, Ser 3.24 (H) 0.44 - 1.00 mg/dL   Calcium 8.1 (L) 8.9 - 10.3 mg/dL   GFR, Estimated 15 (L) >60 mL/min    Comment: (NOTE) Calculated using the CKD-EPI Creatinine Equation (2021)    Anion gap 7 5 - 15    Comment: Performed at [facility] [address] [place], [place] [address]  Glucose, capillary     Status: Abnormal   Collection Time: 05/24/22  7:12 AM  Result Value Ref Range   Glucose-Capillary 153 (H) 70 - 99 mg/dL    Comment: Glucose reference range applies only to samples taken after fasting for at least 8 hours.  CBC     Status: Abnormal   Collection Time: 05/24/22 10:17 AM  Result Value  Ref Range   WBC 11.0 (H) 4.0 - 10.5 K/uL   RBC 3.03 (L) 3.87 - 5.11 MIL/uL   Hemoglobin 8.2 (L) 12.0 - 15.0 g/dL   HCT 25.0 (L) 36.0 - 46.0 %   MCV 82.5 80.0 - 100.0 fL   MCH 27.1 26.0 - 34.0  pg   MCHC 32.8 30.0 - 36.0 g/dL   RDW 18.1 (H) 11.5 - 15.5 %   Platelets 212 150 - 400 K/uL   nRBC 0.0 0.0 - 0.2 %    Comment: Performed at [facility], [place] [address], [place], [name] [address]  Hemoglobin and hematocrit, blood     Status: Abnormal   Collection Time: 05/24/22 11:44 AM  Result Value Ref Range   Hemoglobin 8.6 (L) 12.0 - 15.0 g/dL   HCT 26.5 (L) 36.0 - 46.0 %    Comment: Performed at [facility], [place] [address], [place], [place] [address]  Glucose, capillary     Status: Abnormal   Collection Time: 05/24/22 11:51 AM  Result Value Ref Range   Glucose-Capillary 177 (H) 70 - 99 mg/dL    Comment: Glucose reference range applies only to samples taken after fasting for at least 8 hours.  Glucose, capillary     Status: Abnormal   Collection Time: 05/24/22  3:32 PM  Result Value Ref Range   Glucose-Capillary 155 (H) 70 - 99 mg/dL    Comment: Glucose reference range applies only to samples taken after fasting for at least 8 hours.   *Note: Due to a large number of results and/or encounters for the requested time period, some results have not been displayed. A complete set of results can be found in Results Review.    DG Chest Port 1 View  Result Date: 05/23/2022 CLINICAL DATA:  Caliber central line placement EXAM: PORTABLE CHEST 1 VIEW COMPARISON:  04/02/2022 FINDINGS: Interval placement of a LEFT central venous line with tip in the distal SVC at the cavoatrial junction. No pneumothorax. Large bore RIGHT central venous catheter unchanged. Low lung volumes. IMPRESSION: Central venous line with tip at the cavoatrial junction. No pneumothorax Electronically Signed   By: Suzy Bouchard M.D.   On: 05/23/2022 15:01   CT Angio Abd/Pel W and/or Wo Contrast  Result Date:  05/23/2022 CLINICAL DATA:  Ongoing lower GI bleed. EXAM: CTA ABDOMEN AND PELVIS WITHOUT AND WITH CONTRAST TECHNIQUE: Multidetector CT imaging of the abdomen and pelvis was performed using the standard protocol during bolus administration of intravenous contrast. Multiplanar reconstructed images and MIPs were obtained and reviewed to evaluate the vascular anatomy. RADIATION DOSE REDUCTION: This exam was performed according to the departmental dose-optimization program which includes automated exposure control, adjustment of the mA and/or kV according to patient size and/or use of iterative reconstruction technique. CONTRAST:  134m OMNIPAQUE IOHEXOL 350 MG/ML SOLN COMPARISON:  CTA abdomen and pelvis earlier today at 12:53 a.m., CT abdomen pelvis without contrast 03/27/2022 and 08/22/2020. FINDINGS: VASCULAR Aorta: Moderate calcific plaque. No aneurysm, dissection or stenosis. Celiac: Moderate calcification noted without flow-limiting celiac stenosis, with circumferential calcifications in the branch arteries and extensive small branch arterial calcifications within the spleen and central liver. SMA: There is moderate calcification proximal to the inflection point without stenosis, circumferential calcification distal to the inflection point. No major-vessel stenosis. Renals: There are calcifications moderately in both proximal renal arteries but no hemodynamically significant (greater than 50%) stenosis. Calcifications continue into the hila and parenchyma along the branch arteries. IMA: Circumferentially calcified with severe origin stenosis. Flow is otherwise seen as previously. Inflow: No interval change from earlier today. Calcifications are greatest in the common iliac arteries and internal iliac arteries, but no stenosis greater than 50% is seen. There is only mild calcification in the external iliac arteries no stenosis. Proximal Outflow: The proximal outflow vessels demonstrate  bilateral calcifications  without flow-limiting stenoses. Veins: Patent. Review of the MIP images confirms the above findings. NON-VASCULAR Lower chest: No acute abnormality. Hepatobiliary: Scattered calcified granulomas. Capsular nodularity suggesting at least early cirrhosis with mild hepatic steatosis. No mass enhancement. There is cholelithiasis without bile duct dilatation or cholecystitis. Pancreas: Diffusely atrophic.  No other focal abnormality. Spleen: Mildly enlarged with scattered calcified granulomas, 15 cm AP. No focal abnormality. Adrenals/Urinary Tract: No new abnormality. Extensive renovascular calcifications. Stomach/Bowel: Wall thickening of the distal sigmoid colon and rectum is again noted with adjacent stranding. In contrast to the earlier study today, there is now an irregular contrast blush into the lumen from the left dorsal aspect of the distal rectal wall on arterial phase series 8 axial images 192-201, with further diffusion of leaked contrast into the rectal content on venous phase series 10 axial images 133-191. The contrast blush in the arterial phase is somewhat serpiginous with a least 1 and possibly 2 sites of active leakage. Upstream diverticulosis and constipation are redemonstrated. Lymphatic: No adenopathy. Reproductive: Status post hysterectomy. No adnexal masses. Other: Mild body wall edema. Minimal deep pelvic ascites is similar. Musculoskeletal: Osteopenia and degenerative change. IMPRESSION: 1. There is an irregular, somewhat serpiginous contrast blush into the distal rectum from the left dorsal wall on the arterial phase with further diffusion of the leaked contrast into the rectal content on the venous phase. Findings are consistent with active GI bleeding. 2. Extensive vascular calcifications, especially of small intraorgan branch arteries. 3. Wall thickening of the distal sigmoid colon and rectum with adjacent stranding. Findings consistent with proctocolitis. 4. Upstream diverticulosis and  constipation. 5. Cirrhotic liver morphology with mild splenomegaly. The main portal vein is normal caliber. 6. Cholelithiasis. 7. Mild body wall edema and minimal pelvic ascites. 8. Osteopenia and degenerative change. 9. Aortic atherosclerosis. 10. Additional redemonstrated findings described above as well as earlier today. 11. These results will be called to the ordering clinician or representative by the radiologist assistant, and communication documented in the PACS or [facility] dashboard. Aortic Atherosclerosis (ICD10-I70.0). Electronically Signed   By: Telford Nab M.D.   On: 05/23/2022 06:28   CT Angio Abd/Pel W and/or Wo Contrast  Result Date: 05/23/2022 CLINICAL DATA:  Lower GI bleed.  Bloody diarrhea x4 days. EXAM: CTA ABDOMEN AND PELVIS WITHOUT AND WITH CONTRAST TECHNIQUE: Multidetector CT imaging of the abdomen and pelvis was performed using the standard protocol during bolus administration of intravenous contrast. Multiplanar reconstructed images and MIPs were obtained and reviewed to evaluate the vascular anatomy. RADIATION DOSE REDUCTION: This exam was performed according to the departmental dose-optimization program which includes automated exposure control, adjustment of the mA and/or kV according to patient size and/or use of iterative reconstruction technique. CONTRAST:  165m OMNIPAQUE IOHEXOL 350 MG/ML SOLN COMPARISON:  CT of abdomen and pelvis studies, both without contrast, dated 03/27/2022 and 08/22/2020. There is also an MRCP from 02/01/2022. FINDINGS: VASCULAR Aorta: There are moderate patchy calcific plaques without aneurysm, flow-limiting stenosis or dissection. Celiac: There are patchy calcific plaques of the celiac artery and its major branch arteries. Ostial calcific plaques cause 40% origin stenosis but there is no other focal stenosis, no dissection or aneurysm. There are extensive calcifications in the hepatic and splenic parenchymal small arteries. SMA: There are moderate  calcific plaques, including circumferential calcifications distal to the vessel inflection point, but no flow-limiting major-vessel stenosis. Renals: There are calcific plaques, which continue to the renal hila but no flow-limiting or greater than 50% major vessel stenosis is  seen. Both renal arteries are single. IMA: There are circumferential vessel calcifications. There is high-grade vessel origin stenosis but no other visible focal stenosis. Inflow: There are moderate calcifications in the common iliac arteries but no hemodynamically significant or greater than 50% stenosis is seen. There are circumferential calcifications in the internal iliac arteries but no flow-limiting stenosis. There is mild scattered calcification in the external iliac arteries without significant stenosis. Proximal Outflow: There are calcifications in the common femoral and proximal superficial femoral arteries and of the visualized deep femoral arteries, without flow-limiting stenosis. Veins: Patent.  The main portal vein is normal in caliber. Review of the MIP images confirms the above findings. NON-VASCULAR Lower chest: There is mild cardiomegaly. There is calcification in the right coronary artery. The intraventricular blood pool is low in density on the noncontrast scan consistent with anemia. Lung bases are clear. Right pleural effusion noted previously has cleared. Hepatobiliary: The liver is 18 cm length mildly steatotic with scattered calcified granulomas. There is no mass enhancement. Some images suggest capsular lobulation which could be due to early cirrhosis. There are stones in the gallbladder but no wall thickening or biliary prominence. Pancreas: Moderately atrophic without inflammatory changes, mass enhancement or ductal dilatation. Spleen: Mildly enlarged, with multiple calcified granulomas, unchanged in size at 15 cm AP. No mass is seen. Adrenals/Urinary Tract: There is no adrenal mass. There is mild bilateral renal  volume loss likely vascular etiology. There is no mass enhancement. There are small right parapelvic cysts which are unchanged with no follow-up required. Extensive renovascular calcifications extend to the hila. There is no urinary stone or obstruction. There has no bladder thickening. There previously was air in the bladder which is not seen today. Stomach/Bowel: There is increased antral gastric fold thickening without inflammatory changes or penetrating ulcer. The unopacified small bowel is unremarkable and there is a normal appendix. There is moderate stool retention in the ascending and transverse colon. There are colonic diverticula most advanced in the sigmoid segment. There is wall thickening and stranding along the distal sigmoid colon and rectum consistent with proctocolitis, but no arterial or venous contrast leakage is seen in the lumen. Lymphatic: No adenopathy is seen. Reproductive: Uterus and bilateral adnexa are unremarkable. Other: There is mild body wall anasarca but less pronounced than on 04-11-2022. There is minimal ascites in the posterior deep pelvis but the abdominal ascites noted previously has cleared. There is no incarcerated hernia. No bowel pneumatosis is seen and no free air or free hemorrhage. Musculoskeletal: Advanced marginal osteophytosis thoracic and lumbar spine. 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 Mild osteopenia. IMPRESSION: 1. No evidence of arterial or venous contrast leakage into the bowel lumen. 2. Extensive vascular calcifications, particularly the small intraorgan parenchymal branch arteries including renovascular. 3. 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 4. Major branch arteries otherwise without flow-limiting (or greater than 50%) stenoses. 5. Cardiomegaly with calcific CAD. 6. Anemia. 7. Proctocolitis involving the distal sigmoid colon and rectum, likely infectious or inflammatory. No pneumatosis  or portal venous gas. Ischemic etiology not strictly excluded. 8. Diverticulosis without evidence of diverticulitis.  Constipation. 9. Mild hepatic steatosis with possible early cirrhosis. Mild splenomegaly. 10. Cholelithiasis. 11. Mild body wall anasarca but less pronounced than on 04-11-2022. 12. Minimal ascites in the posterior deep pelvis. 13. Osteopenia and degenerative change. 14. Increased antral gastric fold thickening which could be due to nondistention or gastritis. 15. Aortic atherosclerosis. Aortic Atherosclerosis (ICD10-I70.0). Electronically Signed  By: Telford Nab M.D.   On: 05/23/2022 01:47    Review of Systems  Gastrointestinal:  Positive for blood in stool.  All other systems reviewed and are negative.   PE Blood pressure (!) 106/57, pulse 73, temperature 97.6 F (36.4 C), temperature source Oral, resp. rate 13, weight 98 kg, SpO2 100 %. Constitutional: NAD; conversant; no deformities Eyes: Moist conjunctiva; no lid lag; anicteric; PERRL Neck: Trachea midline; no thyromegaly Lungs: Normal respiratory effort; no tactile fremitus CV: RRR; no palpable thrills; no pitting edema GI: Abd soft, NT; no palpable hepatosplenomegaly MSK: R BKA; no clubbing/cyanosis Psychiatric: Appropriate affect; alert and oriented x3 Lymphatic: No palpable cervical or axillary lymphadenopathy Skin: No major subcutaneous nodules. Warm and dry   Assessment/Plan: 71 yo female with diabetes, ESRD, CHF with rectal ulcer bleeding s/p flex sig -will continue to monitor -discussed possible operative interventions which the patient said she would agree to  I reviewed last 24 h vitals and pain scores, last 48 h intake and output, last 24 h labs and trends, and last 24 h imaging results.  This care required high  level of medical decision making.   Susan [name] 05/24/2022, 3:40 PM

## 2022-05-24 NOTE — Progress Notes (Signed)
[facility] Progress Note Patient Name: [name] DOB: 05/08/1951 MRN: [id number]   Date of Service  05/24/2022  HPI/Events of Note  Patient with post-op pain.  eICU Interventions  PRN iv Dilaudid, Ofirmev 1 gm iv x 1 ordered.        Kerry Kass Navid Lenzen 05/24/2022, 10:32 PM

## 2022-05-24 NOTE — Anesthesia Procedure Notes (Signed)
Procedure Name: Intubation Date/Time: 05/24/2022 8:15 PM  Performed by: Reece Agar, CRNAPre-anesthesia Checklist: Patient identified, Emergency Drugs available, Suction available and Patient being monitored Patient Re-evaluated:Patient Re-evaluated prior to induction Oxygen Delivery Method: Circle System Utilized Preoxygenation: Pre-oxygenation with 100% oxygen Induction Type: IV induction, Rapid sequence and Cricoid Pressure applied Laryngoscope Size: Mac and 3 Grade View: Grade I Tube type: Oral Tube size: 7.0 mm Number of attempts: 1 Airway Equipment and Method: Stylet Placement Confirmation: ETT inserted through vocal cords under direct vision, positive ETCO2 and breath sounds checked- equal and bilateral Secured at: 22 cm Tube secured with: Tape Dental Injury: Teeth and Oropharynx as per pre-operative assessment

## 2022-05-24 NOTE — H&P (View-Only) (Signed)
[name] Progress [facility] Weekend coverage for Dr. Benson Norway  CC:  Hematochezia   Subjective: She is lethargic but arousable. No further hematochezia overnight. No abdominal pain. No CP or SOB. NO family at the bedside.    Objective:   Flexible sigmoidoscopy 05/24/2022: - A few ulcers in the rectum. Treated with bipolar cautery. Clips (MR conditional) were placed. Clip manufacturer - If rebleeding occurs, hemostatic gel versus spray can be used. - If bleeding is persistent IR intervention will be required.  Vital signs in last 24 hours: Temp:  [97.8 F (36.6 C)-99.1 F (37.3 C)] 98 F (36.7 C) (12/30 0715) Pulse Rate:  [67-94] 74 (12/30 0700) Resp:  [7-18] 12 (12/30 0700) BP: (82-158)/(31-109) 123/45 (12/30 0700) SpO2:  [99 %-100 %] 99 % (12/30 0700) Weight:  [98 kg] 98 kg (12/30 0500) Last BM Date : 05/23/22 General: Ill appearing 71 year old female in NAD.  Heart: RRR, no murmur.  Chest: Right subclavian dialysis port.  Pulm: Breath sounds clear throughout. On oxygen 2 L [place]. Abdomen: Obese abdomen, nondistended. Nontender. No palpable mass. Hypoactive bowel sounds x 4 quads.  Extremities:  Upper and lower extremities with pitting edema, weeping serous fluid. 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  Neurologic:  Alert and  oriented x 3. Sleeping deeply but arousable.  Psych:  Alert and cooperative. Normal mood and affect.  Intake/Output from previous day: 12/29 0701 - 12/30 0700 In: 459.4 [I.V.:334.6; IV Piggyback:124.8] Out: 0  Intake/Output this shift: No intake/output data recorded.  Lab Results: Recent Labs    05/22/22 2220 05/22/22 2253 05/23/22 0424 05/23/22 1811 05/23/22 2211 05/24/22 0409  WBC 9.4  --  9.8 13.3*  --   --   HGB 9.0*   < > 7.8* 8.8* 8.2* 8.6*  HCT 32.1*   < > 25.9* 26.9* 26.1* 26.6*  PLT 265  --  258 294  --   --    < > = values in this interval not displayed.   BMET Recent Labs    05/22/22 2220  05/22/22 2253 05/24/22 0409  NA 131* 133* 131*  K 3.6 3.6 3.2*  CL 96* 94* 98  CO2 24  --  26  GLUCOSE 344* 357* 174*  BUN 26* 28* 40*  CREATININE 2.80* 2.70* 3.24*  CALCIUM 8.6*  --  8.1*   LFT Recent Labs    05/22/22 2220  PROT 5.9*  ALBUMIN 2.1*  AST 25  ALT 9  ALKPHOS 110  BILITOT <0.1*   PT/INR Recent Labs    05/22/22 2220  LABPROT 16.6*  INR 1.4*   Hepatitis Panel No results for input(s): "HEPBSAG", "HCVAB", "HEPAIGM", "HEPBIGM" in the last 72 hours.  DG Chest Port 1 View  Result Date: 05/23/2022 CLINICAL DATA:  Caliber central line placement EXAM: PORTABLE CHEST 1 VIEW COMPARISON:  04/02/2022 FINDINGS: Interval placement of a LEFT central venous line with tip in the distal SVC at the cavoatrial junction. No pneumothorax. Large bore RIGHT central venous catheter unchanged. Low lung volumes. IMPRESSION: Central venous line with tip at the cavoatrial junction. No pneumothorax Electronically Signed   By: Suzy Bouchard M.D.   On: 05/23/2022 15:01   CT Angio Abd/Pel W and/or Wo Contrast  Result Date: 05/23/2022 CLINICAL DATA:  Ongoing lower GI bleed. EXAM: CTA ABDOMEN AND PELVIS WITHOUT AND WITH CONTRAST TECHNIQUE: Multidetector CT imaging of the abdomen and pelvis was performed using the standard protocol during bolus administration of intravenous contrast. Multiplanar reconstructed images  and MIPs were obtained and reviewed to evaluate the vascular anatomy. RADIATION DOSE REDUCTION: This exam was performed according to the departmental dose-optimization program which includes automated exposure control, adjustment of the mA and/or kV according to patient size and/or use of iterative reconstruction technique. CONTRAST:  142m OMNIPAQUE IOHEXOL 350 MG/ML SOLN COMPARISON:  CTA abdomen and pelvis earlier today at 12:53 a.m., CT abdomen pelvis without contrast 03/27/2022 and 08/22/2020. FINDINGS: VASCULAR Aorta: Moderate calcific plaque. No aneurysm, dissection or stenosis.  Celiac: Moderate calcification noted without flow-limiting celiac stenosis, with circumferential calcifications in the branch arteries and extensive small branch arterial calcifications within the spleen and central liver. SMA: There is moderate calcification proximal to the inflection point without stenosis, circumferential calcification distal to the inflection point. No major-vessel stenosis. Renals: There are calcifications moderately in both proximal renal arteries but no hemodynamically significant (greater than 50%) stenosis. Calcifications continue into the hila and parenchyma along the branch arteries. IMA: Circumferentially calcified with severe origin stenosis. Flow is otherwise seen as previously. Inflow: No interval change from earlier today. Calcifications are greatest in the common iliac arteries and internal iliac arteries, but no stenosis greater than 50% is seen. There is only mild calcification in the external iliac arteries no stenosis. Proximal Outflow: The proximal outflow vessels demonstrate bilateral calcifications without flow-limiting stenoses. Veins: Patent. Review of the MIP images confirms the above findings. NON-VASCULAR Lower chest: No acute abnormality. Hepatobiliary: Scattered calcified granulomas. Capsular nodularity suggesting at least early cirrhosis with mild hepatic steatosis. No mass enhancement. There is cholelithiasis without bile duct dilatation or cholecystitis. Pancreas: Diffusely atrophic.  No other focal abnormality. Spleen: Mildly enlarged with scattered calcified granulomas, 15 cm AP. No focal abnormality. Adrenals/Urinary Tract: No new abnormality. Extensive renovascular calcifications. Stomach/Bowel: Wall thickening of the distal sigmoid colon and rectum is again noted with adjacent stranding. In contrast to the earlier study today, there is now an irregular contrast blush into the lumen from the left dorsal aspect of the distal rectal wall on arterial phase series 8  axial images 192-201, with further diffusion of leaked contrast into the rectal content on venous phase series 10 axial images 133-191. The contrast blush in the arterial phase is somewhat serpiginous with a least 1 and possibly 2 sites of active leakage. Upstream diverticulosis and constipation are redemonstrated. Lymphatic: No adenopathy. Reproductive: Status post hysterectomy. No adnexal masses. Other: Mild body wall edema. Minimal deep pelvic ascites is similar. Musculoskeletal: Osteopenia and degenerative change. IMPRESSION: 1. There is an irregular, somewhat serpiginous contrast blush into the distal rectum from the left dorsal wall on the arterial phase with further diffusion of the leaked contrast into the rectal content on the venous phase. Findings are consistent with active GI bleeding. 2. Extensive vascular calcifications, especially of small intraorgan branch arteries. 3. Wall thickening of the distal sigmoid colon and rectum with adjacent stranding. Findings consistent with proctocolitis. 4. Upstream diverticulosis and constipation. 5. Cirrhotic liver morphology with mild splenomegaly. The main portal vein is normal caliber. 6. Cholelithiasis. 7. Mild body wall edema and minimal pelvic ascites. 8. Osteopenia and degenerative change. 9. Aortic atherosclerosis. 10. Additional redemonstrated findings described above as well as earlier today. 11. These results will be called to the ordering clinician or representative by the radiologist assistant, and communication documented in the PACS or [place]. Aortic Atherosclerosis (ICD10-I70.0). Electronically Signed   By: KTelford NabM.D.   On: 05/23/2022 06:28   CT Angio Abd/Pel W and/or Wo Contrast  Result Date: 05/23/2022 CLINICAL DATA:  Lower GI bleed.  Bloody diarrhea x4 days. EXAM: CTA ABDOMEN AND PELVIS WITHOUT AND WITH CONTRAST TECHNIQUE: Multidetector CT imaging of the abdomen and pelvis was performed using the standard protocol during  bolus administration of intravenous contrast. Multiplanar reconstructed images and MIPs were obtained and reviewed to evaluate the vascular anatomy. RADIATION DOSE REDUCTION: This exam was performed according to the departmental dose-optimization program which includes automated exposure control, adjustment of the mA and/or kV according to patient size and/or use of iterative reconstruction technique. CONTRAST:  186m OMNIPAQUE IOHEXOL 350 MG/ML SOLN COMPARISON:  CT of abdomen and pelvis studies, both without contrast, dated 03/27/2022 and 08/22/2020. There is also an MRCP from 02/01/2022. FINDINGS: VASCULAR Aorta: There are moderate patchy calcific plaques without aneurysm, flow-limiting stenosis or dissection. Celiac: There are patchy calcific plaques of the celiac artery and its major branch arteries. Ostial calcific plaques cause 40% origin stenosis but there is no other focal stenosis, no dissection or aneurysm. There are extensive calcifications in the hepatic and splenic parenchymal small arteries. SMA: There are moderate calcific plaques, including circumferential calcifications distal to the vessel inflection point, but no flow-limiting major-vessel stenosis. Renals: There are calcific plaques, which continue to the renal hila but no flow-limiting or greater than 50% major vessel stenosis is seen. Both renal arteries are single. IMA: There are circumferential vessel calcifications. There is high-grade vessel origin stenosis but no other visible focal stenosis. Inflow: There are moderate calcifications in the common iliac arteries but no hemodynamically significant or greater than 50% stenosis is seen. There are circumferential calcifications in the internal iliac arteries but no flow-limiting stenosis. There is mild scattered calcification in the external iliac arteries without significant stenosis. Proximal Outflow: There are calcifications in the common femoral and proximal superficial femoral arteries  and of the visualized deep femoral arteries, without flow-limiting stenosis. Veins: Patent.  The main portal vein is normal in caliber. Review of the MIP images confirms the above findings. NON-VASCULAR Lower chest: There is mild cardiomegaly. There is calcification in the right coronary artery. The intraventricular blood pool is low in density on the noncontrast scan consistent with anemia. Lung bases are clear. Right pleural effusion noted previously has cleared. Hepatobiliary: The liver is 18 cm length mildly steatotic with scattered calcified granulomas. There is no mass enhancement. Some images suggest capsular lobulation which could be due to early cirrhosis. There are stones in the gallbladder but no wall thickening or biliary prominence. Pancreas: Moderately atrophic without inflammatory changes, mass enhancement or ductal dilatation. Spleen: Mildly enlarged, with multiple calcified granulomas, unchanged in size at 15 cm AP. No mass is seen. Adrenals/Urinary Tract: There is no adrenal mass. There is mild bilateral renal volume loss likely vascular etiology. There is no mass enhancement. There are small right parapelvic cysts which are unchanged with no follow-up required. Extensive renovascular calcifications extend to the hila. There is no urinary stone or obstruction. There has no bladder thickening. There previously was air in the bladder which is not seen today. Stomach/Bowel: There is increased antral gastric fold thickening without inflammatory changes or penetrating ulcer. The unopacified small bowel is unremarkable and there is a normal appendix. There is moderate stool retention in the ascending and transverse colon. There are colonic diverticula most advanced in the sigmoid segment. There is wall thickening and stranding along the distal sigmoid colon and rectum consistent with proctocolitis, but no arterial or venous contrast leakage is seen in the lumen. Lymphatic: No adenopathy is seen.  Reproductive: Uterus and bilateral adnexa  are unremarkable. Other: There is mild body wall anasarca but less pronounced than on 04-20-2022. There is minimal ascites in the posterior deep pelvis but the abdominal ascites noted previously has cleared. There is no incarcerated hernia. No bowel pneumatosis is seen and no free air or free hemorrhage. Musculoskeletal: Advanced marginal osteophytosis thoracic and lumbar spine. 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 Mild osteopenia. IMPRESSION: 1. No evidence of arterial or venous contrast leakage into the bowel lumen. 2. Extensive vascular calcifications, particularly the small intraorgan parenchymal branch arteries including renovascular. 3. 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 4. Major branch arteries otherwise without flow-limiting (or greater than 50%) stenoses. 5. Cardiomegaly with calcific CAD. 6. Anemia. 7. Proctocolitis involving the distal sigmoid colon and rectum, likely infectious or inflammatory. No pneumatosis or portal venous gas. Ischemic etiology not strictly excluded. 8. Diverticulosis without evidence of diverticulitis.  Constipation. 9. Mild hepatic steatosis with possible early cirrhosis. Mild splenomegaly. 10. Cholelithiasis. 11. Mild body wall anasarca but less pronounced than on Apr 20, 2022. 12. Minimal ascites in the posterior deep pelvis. 13. Osteopenia and degenerative change. 14. Increased antral gastric fold thickening which could be due to nondistention or gastritis. 15. Aortic atherosclerosis. Aortic Atherosclerosis (ICD10-I70.0). Electronically Signed   By: Telford Nab M.D.   On: 05/23/2022 01:47    Assessment / Plan:  71 year old female admitted to the hospital 05/22/2022 with abdominal pain, diarrhea and hematochezia, hemorrhagic shock on Levophed. On Eliquis for a fib, received Kcentra. Admission Hg 9.0 -> 10.2 -> 7.8 -> transfused 2 units of PRBCs -> today Hg  8.6. CTA 12/29 negative for active bleeding. She continued to have active hematochezia 12/29, repeat CTA showed evidence of active bleeding in the rectum. S/P flex sig by Dr. Benson Norway showed a few ulcers in the rectum treated with bipolar cautery and clips were placed. No further hematochezia overnight or thus far this morning. Hemodynamically stable on low dose Levophed.  -If rebleeding occurs, hemostatic gel versus spray can be utilized -If rebleeding is persistent IR intervention with embolization will be required -Clear liquid diet -Monitor H&H Q 6 hrs -Transfuse for Hg < 8  -Continue PPI IV for now -Await further recommendations per Dr. Lorenso Courier   Acute on chronic anemia secondary to GI blood loss and ESRD  ESRD on HD, scheduled for dialysis today  Afib, Eliquis on hold  CAD  CHF  PVD  DM type II    Active Problems:   GI bleed   Difficult intravenous access     LOS: 1 day   Noralyn Pick  05/24/2022, 08:09AM

## 2022-05-24 NOTE — Progress Notes (Signed)
[facility] Progress Note  Subjective: seen in room, lethargic today  Vitals:   05/24/22 1100 05/24/22 1137 05/24/22 1154 05/24/22 1254  BP: (!) 117/46 (!) 106/48    Pulse: 72 80  78  Resp: '10 15  10  '$ Temp:   97.8 F (36.6 C)   TempSrc:   Oral   SpO2: 99% 100%  100%  Weight:        Exam: Gen alert, no distress, nasal O2 No jvd or bruits Chest clear bilat to bases RRR no MRG Abd soft quite obese ntnd no mass or ascites +bs MS R BKA Ext diffuse 2+ bilat hip  edema Neuro is lethargic, nonfocal    RIJ TDC intact  (and L arm AVF+ bruit resting d/t infection)      Home meds include - albuterol, pacerone, eliquis, coreg 6.25 bid, fidaxomicin, hydralazine 25 qid prn, insulin aspart, synthroid, linagliptin, pregabalin, renvela 2 ac tid, vortioxetine, prns/ vits/ supps        OP HD: SW TTS  4h  400/800   98.5kg  3K/2.5Ca bath  TDC/ AVF resting due to recent infection - mircera 150 mcg q2, last 12/26 - last Hb 8.8 on 12/21, due 06/03/22 - last HD 12/28, post wt 99.3 - no vdra     Assessment/ Plan: Lower GIB - sp flex sig today, per GI / pmd Shock - on pressor support ESRD - on HD TTS. HD planned for today/ tonight HD access - using TDC and resting L AVF due to recent infection H/o HTN - holding home BP meds d/t hemorrhagic shock Volume - sig edema in LE"s, not eating well per dtr for a while. At dry wt. Max UF w/ HD today as tolerated Anemia esrd - Hb 7-10 here, just had esa at OP unit. Follow and transfuse prn.  MBD ckd - CCa in range, will add on phos.  Uncont DM - per pmd DNR   Rob Belen Zwahlen 05/24/2022, 2:45 PM   Recent Labs  Lab 05/22/22 2220 05/22/22 2253 05/23/22 0424 05/24/22 0409 05/24/22 1017 05/24/22 1144  HGB 9.0* 10.2*   < > 8.6* 8.2* 8.6*  ALBUMIN 2.1*  --   --   --   --   --   CALCIUM 8.6*  --   --  8.1*  --   --   CREATININE 2.80* 2.70*  --  3.24*  --   --   K 3.6 3.6  --  3.2*  --   --    < > = values in this interval not  displayed.   No results for input(s): "IRON", "TIBC", "FERRITIN" in the last 168 hours. Inpatient medications:  sodium chloride   Intravenous Once   sodium chloride   Intravenous Once   Chlorhexidine Gluconate Cloth  6 each Topical Q0600   docusate sodium  100 mg Oral BID   insulin aspart  0-15 Units Subcutaneous Q4H   midodrine  10 mg Oral TID with meals   pantoprazole (PROTONIX) IV  40 mg Intravenous Q12H   [START ON 05/25/2022] polyethylene glycol  17 g Oral Daily   vancomycin  125 mg Oral QID    norepinephrine (LEVOPHED) Adult infusion Stopped (05/24/22 0917)   diphenhydrAMINE, mouth rinse

## 2022-05-24 NOTE — Progress Notes (Signed)
[facility] Progress Note Weekend coverage for Dr. Benson Norway  CC:  Hematochezia   Subjective: She is lethargic but arousable. No further hematochezia overnight. No abdominal pain. No CP or SOB. NO family at the bedside.    Objective:   Flexible sigmoidoscopy 05/24/2022: - A few ulcers in the rectum. Treated with bipolar cautery. Clips (MR conditional) were placed. Clip manufacturer - If rebleeding occurs, hemostatic gel versus spray can be used. - If bleeding is persistent IR intervention will be required.  Vital signs in last 24 hours: Temp:  [97.8 F (36.6 C)-99.1 F (37.3 C)] 98 F (36.7 C) (12/30 0715) Pulse Rate:  [67-94] 74 (12/30 0700) Resp:  [7-18] 12 (12/30 0700) BP: (82-158)/(31-109) 123/45 (12/30 0700) SpO2:  [99 %-100 %] 99 % (12/30 0700) Weight:  [98 kg] 98 kg (12/30 0500) Last BM Date : 05/23/22 General: Ill appearing 71 year old female in NAD.  Heart: RRR, no murmur.  Chest: Right subclavian dialysis port.  Pulm: Breath sounds clear throughout. On oxygen 2 L [place]. Abdomen: Obese abdomen, nondistended. Nontender. No palpable mass. Hypoactive bowel sounds x 4 quads.  Extremities:  Upper and lower extremities with pitting edema, weeping serous fluid. 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  Neurologic:  Alert and  oriented x 3. Sleeping deeply but arousable.  Psych:  Alert and cooperative. Normal mood and affect.  Intake/Output from previous day: 12/29 0701 - 12/30 0700 In: 459.4 [I.V.:334.6; IV Piggyback:124.8] Out: 0  Intake/Output this shift: No intake/output data recorded.  Lab Results: Recent Labs    05/22/22 2220 05/22/22 2253 05/23/22 0424 05/23/22 1811 05/23/22 2211 05/24/22 0409  WBC 9.4  --  9.8 13.3*  --   --   HGB 9.0*   < > 7.8* 8.8* 8.2* 8.6*  HCT 32.1*   < > 25.9* 26.9* 26.1* 26.6*  PLT 265  --  258 294  --   --    < > = values in this interval not displayed.   BMET Recent Labs    05/22/22 2220  05/22/22 2253 05/24/22 0409  NA 131* 133* 131*  K 3.6 3.6 3.2*  CL 96* 94* 98  CO2 24  --  26  GLUCOSE 344* 357* 174*  BUN 26* 28* 40*  CREATININE 2.80* 2.70* 3.24*  CALCIUM 8.6*  --  8.1*   LFT Recent Labs    05/22/22 2220  PROT 5.9*  ALBUMIN 2.1*  AST 25  ALT 9  ALKPHOS 110  BILITOT <0.1*   PT/INR Recent Labs    05/22/22 2220  LABPROT 16.6*  INR 1.4*   Hepatitis Panel No results for input(s): "HEPBSAG", "HCVAB", "HEPAIGM", "HEPBIGM" in the last 72 hours.  DG Chest Port 1 View  Result Date: 05/23/2022 CLINICAL DATA:  Caliber central line placement EXAM: PORTABLE CHEST 1 VIEW COMPARISON:  04/02/2022 FINDINGS: Interval placement of a LEFT central venous line with tip in the distal SVC at the cavoatrial junction. No pneumothorax. Large bore RIGHT central venous catheter unchanged. Low lung volumes. IMPRESSION: Central venous line with tip at the cavoatrial junction. No pneumothorax Electronically Signed   By: Suzy Bouchard M.D.   On: 05/23/2022 15:01   CT Angio Abd/Pel W and/or Wo Contrast  Result Date: 05/23/2022 CLINICAL DATA:  Ongoing lower GI bleed. EXAM: CTA ABDOMEN AND PELVIS WITHOUT AND WITH CONTRAST TECHNIQUE: Multidetector CT imaging of the abdomen and pelvis was performed using the standard protocol during bolus administration of intravenous contrast. Multiplanar reconstructed images  and MIPs were obtained and reviewed to evaluate the vascular anatomy. RADIATION DOSE REDUCTION: This exam was performed according to the departmental dose-optimization program which includes automated exposure control, adjustment of the mA and/or kV according to patient size and/or use of iterative reconstruction technique. CONTRAST:  175m OMNIPAQUE IOHEXOL 350 MG/ML SOLN COMPARISON:  CTA abdomen and pelvis earlier today at 12:53 a.m., CT abdomen pelvis without contrast 03/27/2022 and 08/22/2020. FINDINGS: VASCULAR Aorta: Moderate calcific plaque. No aneurysm, dissection or stenosis.  Celiac: Moderate calcification noted without flow-limiting celiac stenosis, with circumferential calcifications in the branch arteries and extensive small branch arterial calcifications within the spleen and central liver. SMA: There is moderate calcification proximal to the inflection point without stenosis, circumferential calcification distal to the inflection point. No major-vessel stenosis. Renals: There are calcifications moderately in both proximal renal arteries but no hemodynamically significant (greater than 50%) stenosis. Calcifications continue into the hila and parenchyma along the branch arteries. IMA: Circumferentially calcified with severe origin stenosis. Flow is otherwise seen as previously. Inflow: No interval change from earlier today. Calcifications are greatest in the common iliac arteries and internal iliac arteries, but no stenosis greater than 50% is seen. There is only mild calcification in the external iliac arteries no stenosis. Proximal Outflow: The proximal outflow vessels demonstrate bilateral calcifications without flow-limiting stenoses. Veins: Patent. Review of the MIP images confirms the above findings. NON-VASCULAR Lower chest: No acute abnormality. Hepatobiliary: Scattered calcified granulomas. Capsular nodularity suggesting at least early cirrhosis with mild hepatic steatosis. No mass enhancement. There is cholelithiasis without bile duct dilatation or cholecystitis. Pancreas: Diffusely atrophic.  No other focal abnormality. Spleen: Mildly enlarged with scattered calcified granulomas, 15 cm AP. No focal abnormality. Adrenals/Urinary Tract: No new abnormality. Extensive renovascular calcifications. Stomach/Bowel: Wall thickening of the distal sigmoid colon and rectum is again noted with adjacent stranding. In contrast to the earlier study today, there is now an irregular contrast blush into the lumen from the left dorsal aspect of the distal rectal wall on arterial phase series 8  axial images 192-201, with further diffusion of leaked contrast into the rectal content on venous phase series 10 axial images 133-191. The contrast blush in the arterial phase is somewhat serpiginous with a least 1 and possibly 2 sites of active leakage. Upstream diverticulosis and constipation are redemonstrated. Lymphatic: No adenopathy. Reproductive: Status post hysterectomy. No adnexal masses. Other: Mild body wall edema. Minimal deep pelvic ascites is similar. Musculoskeletal: Osteopenia and degenerative change. IMPRESSION: 1. There is an irregular, somewhat serpiginous contrast blush into the distal rectum from the left dorsal wall on the arterial phase with further diffusion of the leaked contrast into the rectal content on the venous phase. Findings are consistent with active GI bleeding. 2. Extensive vascular calcifications, especially of small intraorgan branch arteries. 3. Wall thickening of the distal sigmoid colon and rectum with adjacent stranding. Findings consistent with proctocolitis. 4. Upstream diverticulosis and constipation. 5. Cirrhotic liver morphology with mild splenomegaly. The main portal vein is normal caliber. 6. Cholelithiasis. 7. Mild body wall edema and minimal pelvic ascites. 8. Osteopenia and degenerative change. 9. Aortic atherosclerosis. 10. Additional redemonstrated findings described above as well as earlier today. 11. These results will be called to the ordering clinician or representative by the radiologist assistant, and communication documented in the PACS or CBeckerdashboard. Aortic Atherosclerosis (ICD10-I70.0). Electronically Signed   By: KTelford NabM.D.   On: 05/23/2022 06:28   CT Angio Abd/Pel W and/or Wo Contrast  Result Date: 05/23/2022 CLINICAL DATA:  Lower GI bleed.  Bloody diarrhea x4 days. EXAM: CTA ABDOMEN AND PELVIS WITHOUT AND WITH CONTRAST TECHNIQUE: Multidetector CT imaging of the abdomen and pelvis was performed using the standard protocol during  bolus administration of intravenous contrast. Multiplanar reconstructed images and MIPs were obtained and reviewed to evaluate the vascular anatomy. RADIATION DOSE REDUCTION: This exam was performed according to the departmental dose-optimization program which includes automated exposure control, adjustment of the mA and/or kV according to patient size and/or use of iterative reconstruction technique. CONTRAST:  130m OMNIPAQUE IOHEXOL 350 MG/ML SOLN COMPARISON:  CT of abdomen and pelvis studies, both without contrast, dated 03/27/2022 and 08/22/2020. There is also an MRCP from 02/01/2022. FINDINGS: VASCULAR Aorta: There are moderate patchy calcific plaques without aneurysm, flow-limiting stenosis or dissection. Celiac: There are patchy calcific plaques of the celiac artery and its major branch arteries. Ostial calcific plaques cause 40% origin stenosis but there is no other focal stenosis, no dissection or aneurysm. There are extensive calcifications in the hepatic and splenic parenchymal small arteries. SMA: There are moderate calcific plaques, including circumferential calcifications distal to the vessel inflection point, but no flow-limiting major-vessel stenosis. Renals: There are calcific plaques, which continue to the renal hila but no flow-limiting or greater than 50% major vessel stenosis is seen. Both renal arteries are single. IMA: There are circumferential vessel calcifications. There is high-grade vessel origin stenosis but no other visible focal stenosis. Inflow: There are moderate calcifications in the common iliac arteries but no hemodynamically significant or greater than 50% stenosis is seen. There are circumferential calcifications in the internal iliac arteries but no flow-limiting stenosis. There is mild scattered calcification in the external iliac arteries without significant stenosis. Proximal Outflow: There are calcifications in the common femoral and proximal superficial femoral arteries  and of the visualized deep femoral arteries, without flow-limiting stenosis. Veins: Patent.  The main portal vein is normal in caliber. Review of the MIP images confirms the above findings. NON-VASCULAR Lower chest: There is mild cardiomegaly. There is calcification in the right coronary artery. The intraventricular blood pool is low in density on the noncontrast scan consistent with anemia. Lung bases are clear. Right pleural effusion noted previously has cleared. Hepatobiliary: The liver is 18 cm length mildly steatotic with scattered calcified granulomas. There is no mass enhancement. Some images suggest capsular lobulation which could be due to early cirrhosis. There are stones in the gallbladder but no wall thickening or biliary prominence. Pancreas: Moderately atrophic without inflammatory changes, mass enhancement or ductal dilatation. Spleen: Mildly enlarged, with multiple calcified granulomas, unchanged in size at 15 cm AP. No mass is seen. Adrenals/Urinary Tract: There is no adrenal mass. There is mild bilateral renal volume loss likely vascular etiology. There is no mass enhancement. There are small right parapelvic cysts which are unchanged with no follow-up required. Extensive renovascular calcifications extend to the hila. There is no urinary stone or obstruction. There has no bladder thickening. There previously was air in the bladder which is not seen today. Stomach/Bowel: There is increased antral gastric fold thickening without inflammatory changes or penetrating ulcer. The unopacified small bowel is unremarkable and there is a normal appendix. There is moderate stool retention in the ascending and transverse colon. There are colonic diverticula most advanced in the sigmoid segment. There is wall thickening and stranding along the distal sigmoid colon and rectum consistent with proctocolitis, but no arterial or venous contrast leakage is seen in the lumen. Lymphatic: No adenopathy is seen.  Reproductive: Uterus and bilateral adnexa  are unremarkable. Other: There is mild body wall anasarca but less pronounced than on 04-02-22. There is minimal ascites in the posterior deep pelvis but the abdominal ascites noted previously has cleared. There is no incarcerated hernia. No bowel pneumatosis is seen and no free air or free hemorrhage. Musculoskeletal: Advanced marginal osteophytosis thoracic and lumbar spine. 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 Mild osteopenia. IMPRESSION: 1. No evidence of arterial or venous contrast leakage into the bowel lumen. 2. Extensive vascular calcifications, particularly the small intraorgan parenchymal branch arteries including renovascular. 3. 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 4. Major branch arteries otherwise without flow-limiting (or greater than 50%) stenoses. 5. Cardiomegaly with calcific CAD. 6. Anemia. 7. Proctocolitis involving the distal sigmoid colon and rectum, likely infectious or inflammatory. No pneumatosis or portal venous gas. Ischemic etiology not strictly excluded. 8. Diverticulosis without evidence of diverticulitis.  Constipation. 9. Mild hepatic steatosis with possible early cirrhosis. Mild splenomegaly. 10. Cholelithiasis. 11. Mild body wall anasarca but less pronounced than on April 02, 2022. 12. Minimal ascites in the posterior deep pelvis. 13. Osteopenia and degenerative change. 14. Increased antral gastric fold thickening which could be due to nondistention or gastritis. 15. Aortic atherosclerosis. Aortic Atherosclerosis (ICD10-I70.0). Electronically Signed   By: Telford Nab M.D.   On: 05/23/2022 01:47    Assessment / Plan:  71 year old female admitted to the hospital 05/22/2022 with abdominal pain, diarrhea and hematochezia, hemorrhagic shock on Levophed. On Eliquis for a fib, received Kcentra. Admission Hg 9.0 -> 10.2 -> 7.8 -> transfused 2 units of PRBCs -> today Hg  8.6. CTA 12/29 negative for active bleeding. She continued to have active hematochezia 12/29, repeat CTA showed evidence of active bleeding in the rectum. S/P flex sig by Dr. Benson Norway showed a few ulcers in the rectum treated with bipolar cautery and clips were placed. No further hematochezia overnight or thus far this morning. Hemodynamically stable on low dose Levophed.  -If rebleeding occurs, hemostatic gel versus spray can be utilized -If rebleeding is persistent IR intervention with embolization will be required -Clear liquid diet -Monitor H&H Q 6 hrs -Transfuse for Hg < 8  -Continue PPI IV for now -Await further recommendations per Dr. Lorenso Courier   Acute on chronic anemia secondary to GI blood loss and ESRD  ESRD on HD, scheduled for dialysis today  Afib, Eliquis on hold  CAD  CHF  PVD  DM type II    Active Problems:   GI bleed   Difficult intravenous access     LOS: 1 day   Noralyn Pick  05/24/2022, 08:09AM

## 2022-05-24 NOTE — Progress Notes (Signed)
HD tx time adjusted to 3 hrs per Dr Jonnie Finner

## 2022-05-24 NOTE — Progress Notes (Signed)
[facility] Progress Note Patient Name: Susan [name] DOB: 11-10-1950 MRN: [id number]   Date of Service  05/24/2022  HPI/Events of Note  Patient with dialysis related pruritus.  eICU Interventions  PRN oral Benadryl ordered.        Kerry Kass Megahn Killings 05/24/2022, 3:52 AM

## 2022-05-24 NOTE — Transfer of Care (Signed)
Immediate Anesthesia Transfer of Care Note  Patient: Susan [name]  Procedure(s) Performed: Jasmine December UNDER ANESTHESIA  Patient Location: ICU  Anesthesia Type:General  Level of Consciousness: awake and alert   Airway & Oxygen Therapy: Patient Spontanous Breathing and Patient connected to nasal cannula oxygen  Post-op Assessment: Report given to RN and Post -op Vital signs reviewed and stable  Post vital signs: Reviewed and stable  Last Vitals:  Vitals Value Taken Time  BP 123/49 05/24/22 2130  Temp 36.6 C 05/24/22 2130  Pulse 80 05/24/22 2130  Resp 16 05/24/22 2130  SpO2 98 % 05/24/22 2130  Vitals shown include unvalidated device data.  Last Pain:  Vitals:   05/24/22 2133  TempSrc: Oral  PainSc:          Complications: No notable events documented.

## 2022-05-24 NOTE — Final Progress Note (Signed)
Patient had a large bright red bloody bowl movement. CCM MD [facility] MD Dayna Barker paged.

## 2022-05-24 NOTE — Progress Notes (Signed)
Recurrent bloody BM despite endoscopic intervention.  CCS to weigh in.  Tranfuse 2 more units blood 2 FFP  Erskine Emery MD PCCM

## 2022-05-24 NOTE — Anesthesia Preprocedure Evaluation (Addendum)
Anesthesia Evaluation  Patient identified by MRN, date of birth, ID band Patient awake    Reviewed: Allergy & Precautions, NPO status , Patient's Chart, lab work & pertinent test results  History of Anesthesia Complications (+) PONV and history of anesthetic complications  Airway Mallampati: II  TM Distance: >3 FB Neck ROM: Full    Dental  (+) Dental Advisory Given   Pulmonary former smoker   breath sounds clear to auscultation       Cardiovascular hypertension, Pt. on home beta blockers and Pt. on medications + CAD, + Past MI, + Peripheral Vascular Disease and +CHF  + dysrhythmias  Rhythm:Regular Rate:Normal     Neuro/Psych  Neuromuscular disease CVA    GI/Hepatic ,GERD  ,,Rectal bleeding    Endo/Other  diabetes, Type 2, Insulin DependentHypothyroidism    Renal/GU ESRF and DialysisRenal disease     Musculoskeletal  (+) Arthritis ,    Abdominal   Peds  Hematology  (+) Blood dyscrasia, anemia   Anesthesia Other Findings   Reproductive/Obstetrics                             Lab Results  Component Value Date   WBC 10.8 (H) 05/24/2022   HGB 7.4 (L) 05/24/2022   HCT 22.8 (L) 05/24/2022   MCV 83.2 05/24/2022   PLT 210 05/24/2022   Lab Results  Component Value Date   CREATININE 3.24 (H) 05/24/2022   BUN 40 (H) 05/24/2022   NA 131 (L) 05/24/2022   K 3.2 (L) 05/24/2022   CL 98 05/24/2022   CO2 26 05/24/2022    Anesthesia Physical Anesthesia Plan  ASA: 4  Anesthesia Plan: General   Post-op Pain Management: Minimal or no pain anticipated   Induction: Intravenous  PONV Risk Score and Plan: 3 and Dexamethasone and Ondansetron  Airway Management Planned: Oral ETT  Additional Equipment: None  Intra-op Plan:   Post-operative Plan: Extubation in OR  Informed Consent: I have reviewed the patients History and Physical, chart, labs and discussed the procedure including the  risks, benefits and alternatives for the proposed anesthesia with the patient or authorized representative who has indicated his/her understanding and acceptance.     Dental advisory given  Plan Discussed with: CRNA  Anesthesia Plan Comments:         Anesthesia Quick Evaluation

## 2022-05-24 NOTE — Interval H&P Note (Signed)
History and Physical Interval Note:  05/24/2022 12:51 PM  Susan [name]  has presented today for surgery, with the diagnosis of Hematochezia.  The various methods of treatment have been discussed with the patient and family. After consideration of risks, benefits and other options for treatment, the patient has consented to  Procedure(s): FLEXIBLE SIGMOIDOSCOPY (N/A) as a surgical intervention.  The patient's history has been reviewed, patient examined, no change in status, stable for surgery.  I have reviewed the patient's chart and labs.  Questions were answered to the patient's satisfaction.     Sharyn Creamer

## 2022-05-24 NOTE — Op Note (Signed)
[facility] Patient Name: Susan [name] Procedure Date : 05/24/2022 MRN: [id number] Attending MD: Georgian Co , , [id number] Date of Birth: Sep 07, 1950 CSN: [id number] Age: 71 Admit Type: Inpatient Procedure:                Flexible Sigmoidoscopy Indications:              Hematochezia Providers:                Adline Mango" Alva Garnet                            Technician, Technician Referring MD:             ICU team Medicines:                Fentanyl 100 micrograms IV, Midazolam 3 mg IV Complications:            No immediate complications. Estimated Blood Loss:     Estimated blood loss was minimal. Procedure:                Pre-Anesthesia Assessment:                           - Prior to the procedure, a History and Physical                            was performed, and patient medications and                            allergies were reviewed. The patient's tolerance of                            previous anesthesia was also reviewed. The risks                            and benefits of the procedure and the sedation                            options and risks were discussed with the patient.                            All questions were answered, and informed consent                            was obtained. Prior Anticoagulants: The patient has                            taken Eliquis (apixaban), last dose was 3 days                            prior to procedure. ASA Grade Assessment: III - A                            patient with severe systemic disease. After  reviewing the risks and benefits, the patient was                            deemed in satisfactory condition to undergo the                            procedure.                           After obtaining informed consent, the scope was                            passed under direct vision. The [id number]-[id number]                            ([id number])  Olympus endoscope was introduced through                            the anus and advanced to the the rectum. The                            flexible sigmoidoscopy was accomplished without                            difficulty. The patient tolerated the procedure                            well. Scope In: Scope Out: Findings:      Multiple ulcers were found in the rectum. One of the ulcers was spurting       bleeding. Stigmata of recent bleeding were present. Purastat gel was       applied to the area with insufficient hemostasis. Thus to stop further       active bleeding, hemostatic spray was deployed. A single spray was       applied. There was no bleeding at the end of the procedure.      A foreign body (previously placed clip) was found in the rectum near the       ulcer that was actively bleeding. Impression:               - Multiple ulcers in the rectum. Purastat applied.                            Hemostatic spray applied.                           - Previously placed clip in the rectum.                           - No specimens collected. Recommendation:           - Return patient to ICU for ongoing care.                           - Continue to trend hemoglobin                           -  If patient has recurrent rectal bleeding, then                            would recommend discussing potential embolization                            with IR. If embolization is not possible, then                            would recommend discussion with surgery about                            possible intervention.                           - Recommend avoidance of constipation to prevent                            worsening of ulcer formation.                           - The findings and recommendations were discussed                            with the ICU team and the patient's husband. Procedure Code(s):        --- Professional ---                           [phone], 55, Sigmoidoscopy,  flexible; with control of                            bleeding, any method Diagnosis Code(s):        --- Professional ---                           K62.6, Ulcer of anus and rectum                           T18.5XXA, Foreign body in anus and rectum, initial                            encounter                           K92.1, Melena (includes Hematochezia) CPT copyright 2022 American Medical Association. All rights reserved. The codes documented in this report are preliminary and upon coder review may  be revised to meet current compliance requirements. Dr Georgian Co [address]" [place],  05/24/2022 2:09:24 PM Number of Addenda: 0

## 2022-05-25 ENCOUNTER — Encounter (HOSPITAL_COMMUNITY): Payer: Self-pay | Admitting: Gastroenterology

## 2022-05-25 DIAGNOSIS — K922 Gastrointestinal hemorrhage, unspecified: Secondary | ICD-10-CM

## 2022-05-25 LAB — BASIC METABOLIC PANEL
Anion gap: 7 (ref 5–15)
BUN: 50 mg/dL — ABNORMAL HIGH (ref 8–23)
CO2: 26 mmol/L (ref 22–32)
Calcium: 7.8 mg/dL — ABNORMAL LOW (ref 8.9–10.3)
Chloride: 97 mmol/L — ABNORMAL LOW (ref 98–111)
Creatinine, Ser: 3.67 mg/dL — ABNORMAL HIGH (ref 0.44–1.00)
GFR, Estimated: 13 mL/min — ABNORMAL LOW (ref >60)
Glucose, Bld: 222 mg/dL — ABNORMAL HIGH (ref 70–99)
Potassium: 3.8 mmol/L (ref 3.5–5.1)
Sodium: 130 mmol/L — ABNORMAL LOW (ref 135–145)

## 2022-05-25 LAB — CBC
HCT: 27.7 % — ABNORMAL LOW (ref 36.0–46.0)
HCT: 31.6 % — ABNORMAL LOW (ref 36.0–46.0)
Hemoglobin: 9 g/dL — ABNORMAL LOW (ref 12.0–15.0)
Hemoglobin: 10.3 g/dL — ABNORMAL LOW (ref 12.0–15.0)
MCH: 27.4 pg (ref 26.0–34.0)
MCH: 27.2 pg (ref 26.0–34.0)
MCHC: 32.5 g/dL (ref 30.0–36.0)
MCHC: 32.6 g/dL (ref 30.0–36.0)
MCV: 84.5 fL (ref 80.0–100.0)
MCV: 83.4 fL (ref 80.0–100.0)
Platelets: 180 10*3/uL (ref 150–400)
Platelets: 246 10*3/uL (ref 150–400)
RBC: 3.28 MIL/uL — ABNORMAL LOW (ref 3.87–5.11)
RBC: 3.79 MIL/uL — ABNORMAL LOW (ref 3.87–5.11)
RDW: 18.9 % — ABNORMAL HIGH (ref 11.5–15.5)
RDW: 19.4 % — ABNORMAL HIGH (ref 11.5–15.5)
WBC: 10.4 10*3/uL (ref 4.0–10.5)
WBC: 14.4 10*3/uL — ABNORMAL HIGH (ref 4.0–10.5)
nRBC: 0 % (ref 0.0–0.2)
nRBC: 0 % (ref 0.0–0.2)

## 2022-05-25 LAB — PREPARE FRESH FROZEN PLASMA
Unit division: 0
Unit division: 0

## 2022-05-25 LAB — BPAM RBC
Blood Product Expiration Date: 202401222359
Blood Product Expiration Date: 202401222359
Blood Product Expiration Date: 202402022359
Blood Product Expiration Date: 202402022359
Blood Product Expiration Date: 202402022359
ISSUE DATE / TIME: 202312290044
ISSUE DATE / TIME: 202312290431
ISSUE DATE / TIME: 202312290756
ISSUE DATE / TIME: 202312301944
ISSUE DATE / TIME: 202312301944
Unit Type and Rh: 5100
Unit Type and Rh: 5100
Unit Type and Rh: 5100
Unit Type and Rh: 6200
Unit Type and Rh: 6200

## 2022-05-25 LAB — GASTROINTESTINAL PANEL BY PCR, STOOL (REPLACES STOOL CULTURE)

## 2022-05-25 LAB — TYPE AND SCREEN
ABO/RH(D): A POS
Antibody Screen: POSITIVE
DAT, IgG: NEGATIVE
Donor AG Type: NEGATIVE
Donor AG Type: NEGATIVE
Donor AG Type: NEGATIVE
Donor AG Type: NEGATIVE
Donor AG Type: NEGATIVE
Unit division: 0
Unit division: 0
Unit division: 0
Unit division: 0
Unit division: 0

## 2022-05-25 LAB — BPAM FFP
Blood Product Expiration Date: 202401032359
Blood Product Expiration Date: 202401032359
ISSUE DATE / TIME: 202312301859
ISSUE DATE / TIME: 202312301944
Unit Type and Rh: 6200
Unit Type and Rh: 6200

## 2022-05-25 LAB — HEMOGLOBIN AND HEMATOCRIT, BLOOD
HCT: 28.4 % — ABNORMAL LOW (ref 36.0–46.0)
Hemoglobin: 9.6 g/dL — ABNORMAL LOW (ref 12.0–15.0)

## 2022-05-25 LAB — GLUCOSE, CAPILLARY
Glucose-Capillary: 158 mg/dL — ABNORMAL HIGH (ref 70–99)
Glucose-Capillary: 189 mg/dL — ABNORMAL HIGH (ref 70–99)
Glucose-Capillary: 191 mg/dL — ABNORMAL HIGH (ref 70–99)
Glucose-Capillary: 195 mg/dL — ABNORMAL HIGH (ref 70–99)
Glucose-Capillary: 223 mg/dL — ABNORMAL HIGH (ref 70–99)
Glucose-Capillary: 240 mg/dL — ABNORMAL HIGH (ref 70–99)
Glucose-Capillary: 272 mg/dL — ABNORMAL HIGH (ref 70–99)

## 2022-05-25 MED ORDER — ONDANSETRON HCL 4 MG/2ML IJ SOLN
4.0000 mg | Freq: Four times a day (QID) | INTRAMUSCULAR | Status: DC | PRN
  Administered 2022-05-25 – 2022-05-27 (×2): 4 mg via INTRAVENOUS
  Filled 2022-05-25 (×3): qty 2

## 2022-05-25 MED ORDER — HYDROMORPHONE HCL 1 MG/ML IJ SOLN
1.0000 mg | INTRAMUSCULAR | Status: DC | PRN
  Administered 2022-05-25 – 2022-05-29 (×16): 1 mg via INTRAVENOUS
  Filled 2022-05-25 (×16): qty 1

## 2022-05-25 MED ORDER — HEPARIN SODIUM (PORCINE) 1000 UNIT/ML IJ SOLN
3800.0000 [IU] | Freq: Once | INTRAMUSCULAR | Status: AC
  Administered 2022-05-25: 3800 [IU]

## 2022-05-25 MED ORDER — MEDIHONEY WOUND/BURN DRESSING EX PSTE
1.0000 | PASTE | Freq: Every day | CUTANEOUS | Status: DC
  Administered 2022-05-25 – 2022-05-29 (×3): 1 via TOPICAL
  Filled 2022-05-25 (×2): qty 44

## 2022-05-25 NOTE — Progress Notes (Addendum)
NAME:  Susan [name], MRN:  [id number], DOB:  09/16/1950, LOS: 2 ADMISSION DATE:  05/22/2022, CONSULTATION DATE: 05/23/2022 REFERRING MD:  Garald Balding, PA-C , CHIEF COMPLAINT: Blood per rectum  History of Present Illness:  71 year old female with end-stage renal disease on hemodialysis, coronary artery disease, paroxysmal A-fib on Eliquis, diabetes and hypertension was brought in the emergency department with complaint of abdominal pain and diarrhea for last few days.  Patient stated she was in her usual state of health but few days ago she started with dark bloody bowel movements initially it was 2-3 times a day but yesterday it happened 6-7 times and she felt weak so was brought to the emergency department.  Initially she was found to be hypotensive, requiring IV fluid with improvement in blood pressure, in the emergency department she had multiple large bowel movement containing clots.  GI was consulted they recommend CT angiogram abdomen and pelvis, initial CT angio was negative.  Patient is compliant with her Eliquis, she received Kcentra for anticoagulation reversal, as she was actively bleeding, PCCM was consulted for help evaluation and ICU admission Patient stated abdominal pain is better denies nausea, vomiting, fever or chills  Pertinent  Medical History   Past Medical History:  Diagnosis Date   Acute MI ([place]) 1999; 2007; 03/05/21   Anemia    hx   Anginal pain ([place])    Anxiety    ARF (acute renal failure) ([place]) 06/2017   [facility]   Arthritis    "generalized" (03/15/2014)   CAD (coronary artery disease)    MI in 2000 - MI  2007 - treated bare metal stent (no nuclear since then as 9/11)   Carotid artery disease (HCC)    CHF (congestive heart failure) ([place])    HFrEF 03/06/21   Chronic diastolic heart failure ([place])    a) ECHO (08/2013) EF 55-60% and RV function nl b) RHC (08/2013) RA 4, RV 30/5/7, PA 25/10 (16), PCWP 7, Fick CO/CI 6.3/2.7, PVR 1.5 WU, PA 61 and 66%    Daily headache    "~ every other day; since I fell in June" (03/15/2014)   Depression    Dyslipidemia    Dyspnea    uses oxygen qhs via [place]   ESRD (end stage renal disease) ([place])    Dialysis on Tues Thurs Sat   Exertional shortness of breath    History of kidney stones    HTN (hypertension)    Hypothyroidism    Neuropathy    Obesity    Osteoarthritis    PAF (paroxysmal atrial fibrillation) (HCC)    Peripheral neuropathy    bilateral feet/hands   PONV (postoperative nausea and vomiting)    RBBB (right bundle branch block)    Old   Stroke ([place])    mini strokes   Syncope    likely due to low blood sugar   Tachycardia    Sinus tachycardia   Type II diabetes mellitus (HCC)    Type II, Lamar Sprinkles libre left upper arm. patient has omnipod insulin pump with Novolin R Insulin   Urinary incontinence    Venous insufficiency      Significant Hospital Events: Including procedures, antibiotic start and stop dates in addition to other pertinent events   05/23/22 Flex Sig, cautery/clipping rectal ulcers  Interim History / Subjective:  Lots of rectal pain this am. Wants food  Objective   Blood pressure (!) 133/57, pulse 83, temperature 98.1 F (36.7 C), temperature source Oral, resp. rate  17, weight 98 kg, SpO2 99 %.        Intake/Output Summary (Last 24 hours) at 05/25/2022 1012 Last data filed at 05/25/2022 0600 Gross per 24 hour  Intake 1470.36 ml  Output 20 ml  Net 1450.36 ml    Filed Weights   05/22/22 2215 05/24/22 0500 05/25/22 0400  Weight: 98 kg 98 kg 98 kg    Examination: Chronically ill Global anasarca unchanged Abd soft, minimal TTP lower quadrants Ext warm Moves to command  Pressors off CBC good BMP good  Resolved Hospital Problem list     Assessment & Plan:  Hemorrhagic shock 2/2 rectal ulcer bleeding post intervention- endo clipping x 2 now s/p oversewing in OR 12/30 ESRD on HD Afib on AC CAD DM2 HTN Muscular deconditioning Hx c diff-  stool consistently here has been variable, C diff ordered on admit never sent  - Clear liquid diet - PT/OT - BID CBC - iHD per nephrology - Continue to hold all AC and antiplatelets  - DNR - Keep in ICU until hematochezia is resolved  Erskine Emery MD PCCM

## 2022-05-25 NOTE — Progress Notes (Signed)
OT Cancellation Note  Patient Details Name: Susan [name] MRN: [id number] DOB: 1951/01/16   Cancelled Treatment:    Reason Eval/Treat Not Completed: Patient at procedure or test/ unavailable (HD currently)   Jeri Modena 05/25/2022, 2:55 PM

## 2022-05-25 NOTE — Consult Note (Signed)
[facility] Nurse Consult Note: Patient is tearful and moaning during pericare to cleanse bloody stool and assess buttocks.  She is given Dilaudid by bedside RN Reason for Consult: S/P I & D left AV fistula S/P Right BKA Chronic compression to left leg, no open wounds Hemorrhoids and hematochezia with resulting incontinence associated skin damage Wound type: See above Pressure Injury POA: NA Measurement: Left arm:  6 cm suture line, intact  Redness and edema ,improving.  Antibiotic coverage in place RIght leg is intact, dry skin and lichenification below knee.  Wears kerlix and coban for modified light compression weekly change.  Right BKA Staple line, with sutures, necrosis and dehiscence to lateral aspect, minimal separation to medial aspect on incision line  erythema to areas with slough.  Rectum with minimal bloody effluent present.  This is cleansed.  She had multiple bleeding internal ulcers that were cauterized and clips placed.  There is epithelial peeling to perirectal area that is tender to touch. Perirectal skin is red. I apply moisture barrier ointment.  I do not see an actual external lesion that needs topical treatment beyond barrier cream.  Wound bed: see above Drainage (amount, consistency, odor) minimal bleeding to R BKA site Periwound: erythema Dressing procedure/placement/frequency: Cleanse wound to right BKA staple/suture line with NS and pat dry. Apply medihoney to slough.  Dry dressing to remainder of staple line Left leg:  Cleanse with soap and water.  Apply barrier ointment to moisturize skin. Wrap with kerlix from below toe to below knee and secure with self adherent Coban  Change twice weekly.  LEft arm suture line intact and can be covered with dry dressing daily.  Secure chat with Dr Sharol Given who agreed to [facility] to right BKA incision line necrotic sites Will not follow at this time.  Please re-consult if needed.  Estrellita Ludwig MSN, RN, FNP-BC CWON Wound, Ostomy, Continence  Nurse [facility] [phone] Pager [phone]

## 2022-05-25 NOTE — Progress Notes (Signed)
Received patient in bed to unit.  Alert and oriented.  Informed consent signed and in chart.   Treatment initiated: 1500 Treatment completed: 1800  Patient tolerated well.   without acute distress.  Hand-off given to patient's nurse.   Access used: catheter Access issues: none  Total UF removed: 3.5 L Medication(s) given: none Post HD VS: 120/51 P 88 R 20 Post HD weight: 94.5 kg   [facility]

## 2022-05-25 NOTE — Progress Notes (Signed)
PT Cancellation Note  Patient Details Name: Susan [name] MRN: [id number] DOB: May 13, 1951   Cancelled Treatment:    Reason Eval/Treat Not Completed: Patient at procedure or test/unavailable; RN reports just started HD.  Stated not done yesterday, so will attempt another day.   Reginia Naas 05/25/2022, 3:51 PM Magda Kiel, PT Acute Rehabilitation Services [phone] 05/25/2022

## 2022-05-25 NOTE — Progress Notes (Signed)
[facility] Progress Note  CC: Hematochezia  Subjective: She is much more awake and alert today.  She is having mild nausea without vomiting.  She stated her bottom is hurting.  No further rectal bleeding overnight or this morning.  Levophed was weaned off.  She denies having any chest pain or shortness of breath.  No family at bedside.   Objective:   Flexible sigmoidoscopy 05/24/2022: - A few ulcers in the rectum. Treated with bipolar cautery. Clips (MR conditional) were placed. Clip manufacturer - If rebleeding occurs, hemostatic gel versus spray can be used. - If bleeding is persistent IR intervention will be required.  Flexible sigmoidoscopy 05/25/2022: - Multiple ulcers in the rectum. Purastat applied. Hemostatic spray applied. - Previously placed clip in the rectum. - No specimens collected.  Vital signs in last 24 hours: Temp:  [97.6 F (36.4 C)-98.8 F (37.1 C)] 98.1 F (36.7 C) (12/31 0715) Pulse Rate:  [59-84] 75 (12/31 0700) Resp:  [5-21] 8 (12/31 0700) BP: (91-143)/(37-105) 119/44 (12/31 0700) SpO2:  [84 %-100 %] 97 % (12/31 0700) Weight:  [98 kg] 98 kg (12/31 0400) Last BM Date : 05/24/22 General:   Alert chronically ill-appearing 71 year old female in no acute distress. Heart: Regular rate and rhythm, soft systolic murmur. Pulm: Breath sounds clear on oxygen 2 L nasal cannula. Abdomen: Soft, generalized tenderness without rebound or guarding.  Hypoactive bowel sounds to all 4 quadrants.  No palpable mass. Extremities:  R BKA. LLE with ace wrap/edema. LUE fistula. Neurologic:  Alert and  oriented x 4.  Speech is clear.  Moves all extremities. Psych:  Alert and cooperative. Normal mood and affect.  Intake/Output from previous day: 12/30 0701 - 12/31 0700 In: 1483.1 [I.V.:241.9; Blood:1127.5; IV Piggyback:113.7] Out: 20 [Blood:20] Intake/Output this shift: No intake/output data recorded.  Lab Results: Recent Labs    05/24/22 1017  05/24/22 1144 05/24/22 1710 05/25/22 0200 05/25/22 0558  WBC 11.0*  --  10.8*  --  10.4  HGB 8.2*   < > 7.4* 9.6* 9.0*  HCT 25.0*   < > 22.8* 28.4* 27.7*  PLT 212  --  210  --  180   < > = values in this interval not displayed.   BMET Recent Labs    05/22/22 2220 05/22/22 2253 05/24/22 0409 05/25/22 0558  NA 131* 133* 131* 130*  K 3.6 3.6 3.2* 3.8  CL 96* 94* 98 97*  CO2 24  --  26 26  GLUCOSE 344* 357* 174* 222*  BUN 26* 28* 40* 50*  CREATININE 2.80* 2.70* 3.24* 3.67*  CALCIUM 8.6*  --  8.1* 7.8*   LFT Recent Labs    05/22/22 2220  PROT 5.9*  ALBUMIN 2.1*  AST 25  ALT 9  ALKPHOS 110  BILITOT <0.1*   PT/INR Recent Labs    05/22/22 2220  LABPROT 16.6*  INR 1.4*   Hepatitis Panel No results for input(s): "HEPBSAG", "HCVAB", "HEPAIGM", "HEPBIGM" in the last 72 hours.  DG Chest Port 1 View  Result Date: 05/23/2022 CLINICAL DATA:  Caliber central line placement EXAM: PORTABLE CHEST 1 VIEW COMPARISON:  04/02/2022 FINDINGS: Interval placement of a LEFT central venous line with tip in the distal SVC at the cavoatrial junction. No pneumothorax. Large bore RIGHT central venous catheter unchanged. Low lung volumes. IMPRESSION: Central venous line with tip at the cavoatrial junction. No pneumothorax Electronically Signed   By: Suzy Bouchard M.D.   On: 05/23/2022 15:01    Assessment / Plan:  71 year old female admitted to the hospital 05/22/2022 with abdominal pain, diarrhea and hematochezia, hemorrhagic shock on Levophed. On Eliquis for a fib, received Kcentra. Admission Hg 9.0 -> 10.2 -> 7.8 -> transfused 2 units of PRBCs -> today Hg 8.6. CTA 12/29 negative for active bleeding. She continued to have active hematochezia 12/29, repeat CTA showed evidence of active bleeding in the rectum. S/P flex sig by Dr. Benson Norway showed a few ulcers in the rectum treated with bipolar cautery and clips were placed. Recurrent rectal bleeding 12/30 s/p repeat sigmoidoscopy showed multiple  ulcers in the rectum, Purastat and hemostatic strap applied, previous placed rectal clip intact.  She subsequently had further rectal bleeding yesterday evening with a drop in her hemoglobin level.  IR was previously consulted, review of CTA showed calcified vessels therefor she was not a candidate for embolization/IR intervention.  She had active rectal bleeding post flex sig. Hg 8.6 -> 7.4. Transfused 2 units of PRBCs -> Hg 9.6. Transfused 1 unit of FFP. General surgery was consulted and she went to OR, a 67m rectal ulcer was injected with Marcaine and Epi and gel foam was inserted into the rectal vault with hemostasis.  No further hematochezia overnight or thus far this morning.  Today hemoglobin 9.0.  She was weaned off Levophed.  Hemodynamically stable. -NPO. -Continue to monitor the patient closely for active GI bleeding -Monitor H&H Q 6 hrs -Transfuse for Hg < 8  -Continue PPI IV for now -Zofran 4 mg IV every 6 hours for nausea -Pain management per the medical service -Await further recommendations per general surgery   Acute on chronic anemia secondary to GI blood loss and ESRD   ESRD on HD, dialyzed 05/24/2022   Afib, Eliquis on hold   CAD   CHF   PVD   DM type II  Active Problems:   GI bleed   Difficult intravenous access     LOS: 2 days   CNoralyn Pick 05/25/2022, 08:05AM

## 2022-05-25 NOTE — Progress Notes (Signed)
[facility] Progress Note  1 Day Post-Op  Subjective: CC-  Complaining of a lot of rectal pain. Denies abdominal pain. Per RN patient had 1 dark red/maroon stool this morning. Hgb 9 after 2u PRBCs yesterday. Off levo this morning.   Objective: Vital signs in last 24 hours: Temp:  [97.6 F (36.4 C)-98.8 F (37.1 C)] 98.1 F (36.7 C) (12/31 0715) Pulse Rate:  [59-84] 75 (12/31 0700) Resp:  [5-21] 8 (12/31 0700) BP: (91-143)/(37-105) 119/44 (12/31 0700) SpO2:  [84 %-100 %] 97 % (12/31 0700) Weight:  [98 kg] 98 kg (12/31 0400) Last BM Date : 05/24/22  Intake/Output from previous day: 12/30 0701 - 12/31 0700 In: 1483.1 [I.V.:241.9; Blood:1127.5; IV Piggyback:113.7] Out: 20 [Blood:20] Intake/Output this shift: No intake/output data recorded.  PE: Gen:  Alert, NAD Card:  RRR Pulm:  rate and effort normal Abd: Soft, NT/ND Ext:  s/p right BKA  Lab Results:  Recent Labs    05/24/22 1710 05/25/22 0200 05/25/22 0558  WBC 10.8*  --  10.4  HGB 7.4* 9.6* 9.0*  HCT 22.8* 28.4* 27.7*  PLT 210  --  180   BMET Recent Labs    05/24/22 0409 05/25/22 0558  NA 131* 130*  K 3.2* 3.8  CL 98 97*  CO2 26 26  GLUCOSE 174* 222*  BUN 40* 50*  CREATININE 3.24* 3.67*  CALCIUM 8.1* 7.8*   PT/INR Recent Labs    05/22/22 2220  LABPROT 16.6*  INR 1.4*   CMP     Component Value Date/Time   NA 130 (L) 05/25/2022 0558   K 3.8 05/25/2022 0558   CL 97 (L) 05/25/2022 0558   CO2 26 05/25/2022 0558   GLUCOSE 222 (H) 05/25/2022 0558   GLUCOSE >444 (H) 06/07/2015 1311   BUN 50 (H) 05/25/2022 0558   CREATININE 3.67 (H) 05/25/2022 0558   CREATININE 6.22 (H) 03/26/2022 1620   CALCIUM 7.8 (L) 05/25/2022 0558   PROT 5.9 (L) 05/22/2022 2220   ALBUMIN 2.1 (L) 05/22/2022 2220   AST 25 05/22/2022 2220   ALT 9 05/22/2022 2220   ALKPHOS 110 05/22/2022 2220   BILITOT <0.1 (L) 05/22/2022 2220   GFRNONAA 13 (L) 05/25/2022 0558   GFRNONAA 11 (L) 11/15/2020 1452   GFRAA 13 (L)  11/15/2020 1452   Lipase     Component Value Date/Time   LIPASE 60 (H) 02/02/2022 0121       Studies/Results: DG Chest Port 1 View  Result Date: 05/23/2022 CLINICAL DATA:  Caliber central line placement EXAM: PORTABLE CHEST 1 VIEW COMPARISON:  04/02/2022 FINDINGS: Interval placement of a LEFT central venous line with tip in the distal SVC at the cavoatrial junction. No pneumothorax. Large bore RIGHT central venous catheter unchanged. Low lung volumes. IMPRESSION: Central venous line with tip at the cavoatrial junction. No pneumothorax Electronically Signed   By: Suzy Bouchard M.D.   On: 05/23/2022 15:01    Anti-infectives: Anti-infectives (From admission, onward)    Start     Dose/Rate Route Frequency Ordered Stop   05/23/22 1800  vancomycin (VANCOCIN) capsule 125 mg        125 mg Oral 4 times daily 05/23/22 1642 06/02/22 1759   05/23/22 0500  piperacillin-tazobactam (ZOSYN) IVPB 3.375 g  Status:  Discontinued        3.375 g 12.5 mL/hr over 240 Minutes Intravenous Every 8 hours 05/23/22 0422 05/23/22 0423   05/23/22 0500  piperacillin-tazobactam (ZOSYN) IVPB 2.25 g  Status:  Discontinued  2.25 g 100 mL/hr over 30 Minutes Intravenous Every 8 hours 05/23/22 0423 05/24/22 1015        Assessment/Plan  Bleeding rectal ulcer - persistent bleeding after endoscopic intervention x2 12/29 and 12/30  -POD#1 s/p exam under anesthesia, oversewing of rectal ulcer 12/30 Dr. Kieth Brightly - Hgb 9 << 9.6 << 7.4 + 2u PRBCs (12/30). Repeat CBC at 1700. Vital signs look ok this morning and she is off levophed  - 1 dark/maroon stool this morning. Continue to monitor  ID - po vancomycin FEN - NPO VTE - held given ABLA Foley - none  C diff A fib on eliquis CAD CHF PVD DM ESRD    LOS: 2 days    Susan [name], [facility] 05/25/2022, 9:04 AM Please see Amion for pager number during day hours 7:00am-4:30pm

## 2022-05-25 NOTE — Progress Notes (Signed)
[facility] Progress Note  Subjective: seen in room, more alert. Went to OR yest for over-sewing of a bleeding rectal ulcer.   Vitals:   05/25/22 1435 05/25/22 1445 05/25/22 1500 05/25/22 1537  BP: (!) 144/56 135/72 (!) 149/53   Pulse:  83 80   Resp: '12 15 10   '$ Temp:    98.3 F (36.8 C)  TempSrc:    Oral  SpO2:  100% 97%   Weight: 98 kg       Exam: Gen alert, no distress, nasal O2 No jvd or bruits Chest clear bilat to bases RRR no MRG Abd soft quite obese ntnd no mass or ascites +bs MS R BKA Ext diffuse 2+ bilat hip  edema Neuro is lethargic, nonfocal    RIJ TDC intact  (and L arm AVF+ bruit resting d/t infection)      Home meds include - albuterol, pacerone, eliquis, coreg 6.25 bid, fidaxomicin, hydralazine 25 qid prn, insulin aspart, synthroid, linagliptin, pregabalin, renvela 2 ac tid, vortioxetine, prns/ vits/ supps        OP HD: SW TTS  4h  400/800   98.5kg  3K/2.5Ca bath  TDC/ AVF resting due to recent infection - mircera 150 mcg q2, last 12/26 - last Hb 8.8 on 12/21, due 06/03/22 - last HD 12/28, post wt 99.3 - no vdra     Assessment/ Plan: Lower GIB - went to OR for oversewing of a bleeding rectal ulcer yesterday 12/30.  Shock - resolved, off pressors today  ESRD - on HD TTS. Getting HD today in place of yesterday as we weren't able to dialyze yesterday due to surgeries/ procedures. Next HD after today should be 1/02.  H/o HTN - we were holding home BP meds shock, these can be re-introduced as needed Volume - sig edema in LE"s, not eating well per dtr for a while. At dry wt. Max UF w/ HD today as tolerated and lower dry wt as tol. Max UF goal 3- 3.5 L w/ HD today.  Anemia esrd - Hb 7-10 here, just had esa at OP unit. Follow and transfuse prn.  MBD ckd - CCa in range, will add on phos.  Uncont DM - per pmd DNR   Susan [name] 05/25/2022, 4:08 PM   Recent Labs  Lab 05/22/22 2220 05/22/22 2253 05/24/22 0409 05/24/22 1017 05/24/22 1710  05/25/22 0200 05/25/22 0558  HGB 9.0*   < > 8.6*   < > 7.4* 9.6* 9.0*  ALBUMIN 2.1*  --   --   --   --   --   --   CALCIUM 8.6*  --  8.1*  --   --   --  7.8*  PHOS  --   --   --   --  3.3  --   --   CREATININE 2.80*   < > 3.24*  --   --   --  3.67*  K 3.6   < > 3.2*  --   --   --  3.8   < > = values in this interval not displayed.    No results for input(s): "IRON", "TIBC", "FERRITIN" in the last 168 hours. Inpatient medications:  sodium chloride   Intravenous Once   sodium chloride   Intravenous Once   Chlorhexidine Gluconate Cloth  6 each Topical Q0600   docusate sodium  100 mg Oral BID   insulin aspart  0-15 Units Subcutaneous Q4H   leptospermum manuka honey  1 Application Topical  Daily   midodrine  10 mg Oral TID with meals   pantoprazole (PROTONIX) IV  40 mg Intravenous Q12H   polyethylene glycol  17 g Oral Daily    norepinephrine (LEVOPHED) Adult infusion Stopped (05/25/22 0401)   diphenhydrAMINE, HYDROmorphone (DILAUDID) injection, ondansetron (ZOFRAN) IV, mouth rinse

## 2022-05-26 ENCOUNTER — Inpatient Hospital Stay (HOSPITAL_COMMUNITY): Payer: PPO

## 2022-05-26 DIAGNOSIS — K922 Gastrointestinal hemorrhage, unspecified: Secondary | ICD-10-CM | POA: Diagnosis not present

## 2022-05-26 LAB — CBC
HCT: 30.1 % — ABNORMAL LOW (ref 36.0–46.0)
Hemoglobin: 9.7 g/dL — ABNORMAL LOW (ref 12.0–15.0)
MCH: 27 pg (ref 26.0–34.0)
MCHC: 32.2 g/dL (ref 30.0–36.0)
MCV: 83.8 fL (ref 80.0–100.0)
Platelets: 247 10*3/uL (ref 150–400)
RBC: 3.59 MIL/uL — ABNORMAL LOW (ref 3.87–5.11)
RDW: 19.7 % — ABNORMAL HIGH (ref 11.5–15.5)
WBC: 13.2 10*3/uL — ABNORMAL HIGH (ref 4.0–10.5)
nRBC: 0 % (ref 0.0–0.2)

## 2022-05-26 LAB — GLUCOSE, CAPILLARY
Glucose-Capillary: 141 mg/dL — ABNORMAL HIGH (ref 70–99)
Glucose-Capillary: 161 mg/dL — ABNORMAL HIGH (ref 70–99)
Glucose-Capillary: 148 mg/dL — ABNORMAL HIGH (ref 70–99)
Glucose-Capillary: 265 mg/dL — ABNORMAL HIGH (ref 70–99)
Glucose-Capillary: 254 mg/dL — ABNORMAL HIGH (ref 70–99)
Glucose-Capillary: 218 mg/dL — ABNORMAL HIGH (ref 70–99)

## 2022-05-26 LAB — BASIC METABOLIC PANEL
Anion gap: 9 (ref 5–15)
BUN: 32 mg/dL — ABNORMAL HIGH (ref 8–23)
CO2: 27 mmol/L (ref 22–32)
Calcium: 8.2 mg/dL — ABNORMAL LOW (ref 8.9–10.3)
Chloride: 97 mmol/L — ABNORMAL LOW (ref 98–111)
Creatinine, Ser: 2.92 mg/dL — ABNORMAL HIGH (ref 0.44–1.00)
GFR, Estimated: 17 mL/min — ABNORMAL LOW (ref >60)
Glucose, Bld: 159 mg/dL — ABNORMAL HIGH (ref 70–99)
Potassium: 3.3 mmol/L — ABNORMAL LOW (ref 3.5–5.1)
Sodium: 133 mmol/L — ABNORMAL LOW (ref 135–145)

## 2022-05-26 LAB — TROPONIN I (HIGH SENSITIVITY): Troponin I (High Sensitivity): 49 ng/L — ABNORMAL HIGH (ref <18)

## 2022-05-26 LAB — HEPATITIS B SURFACE ANTIGEN: Hepatitis B Surface Ag: NONREACTIVE

## 2022-05-26 LAB — PHOSPHORUS: Phosphorus: 3.4 mg/dL (ref 2.5–4.6)

## 2022-05-26 LAB — MAGNESIUM: Magnesium: 2.1 mg/dL (ref 1.7–2.4)

## 2022-05-26 MED ORDER — INSULIN ASPART 100 UNIT/ML IJ SOLN
0.0000 [IU] | Freq: Three times a day (TID) | INTRAMUSCULAR | Status: DC
  Administered 2022-05-26 – 2022-05-27 (×2): 8 [IU] via SUBCUTANEOUS
  Administered 2022-05-27: 15 [IU] via SUBCUTANEOUS
  Administered 2022-05-28 (×3): 5 [IU] via SUBCUTANEOUS
  Administered 2022-05-29: 11 [IU] via SUBCUTANEOUS

## 2022-05-26 MED ORDER — ACETAMINOPHEN 325 MG PO TABS
650.0000 mg | ORAL_TABLET | Freq: Four times a day (QID) | ORAL | Status: DC | PRN
  Administered 2022-05-26: 650 mg via ORAL
  Filled 2022-05-26: qty 2

## 2022-05-26 MED ORDER — POTASSIUM CHLORIDE CRYS ER 20 MEQ PO TBCR
40.0000 meq | EXTENDED_RELEASE_TABLET | Freq: Once | ORAL | Status: AC
  Administered 2022-05-26: 40 meq via ORAL
  Filled 2022-05-26: qty 2

## 2022-05-26 MED ORDER — CHLORHEXIDINE GLUCONATE CLOTH 2 % EX PADS: 6.0000 | MEDICATED_PAD | Freq: Every day | CUTANEOUS | Status: DC

## 2022-05-26 NOTE — Progress Notes (Signed)
NAME:  Susan [name], MRN:  [id number], DOB:  [birth year], LOS: 3 ADMISSION DATE:  05/22/2022, CONSULTATION DATE: 05/23/2022 REFERRING MD:  Garald Balding, PA-C , CHIEF COMPLAINT: Blood per rectum  History of Present Illness:  72 year old female with end-stage renal disease on hemodialysis, coronary artery disease, paroxysmal A-fib on Eliquis, diabetes and hypertension was brought in the emergency department with complaint of abdominal pain and diarrhea for last few days.  Patient stated she was in her usual state of health but few days ago she started with dark bloody bowel movements initially it was 2-3 times a day but yesterday it happened 6-7 times and she felt weak so was brought to the emergency department.  Initially she was found to be hypotensive, requiring IV fluid with improvement in blood pressure, in the emergency department she had multiple large bowel movement containing clots.  GI was consulted they recommend CT angiogram abdomen and pelvis, initial CT angio was negative.  Patient is compliant with her Eliquis, she received Kcentra for anticoagulation reversal, as she was actively bleeding, PCCM was consulted for help evaluation and ICU admission Patient stated abdominal pain is better denies nausea, vomiting, fever or chills  Pertinent  Medical History   Past Medical History:  Diagnosis Date   Acute MI ([place]) 1999; 2007; 03/05/21   Anemia    hx   Anginal pain ([place])    Anxiety    ARF (acute renal failure) ([place]) 06/2017   [facility]   Arthritis    "generalized" (03/15/2014)   CAD (coronary artery disease)    MI in 2000 - MI  2007 - treated bare metal stent (no nuclear since then as 9/11)   Carotid artery disease (HCC)    CHF (congestive heart failure) ([place])    HFrEF 03/06/21   Chronic diastolic heart failure ([place])    a) ECHO (08/2013) EF 55-60% and RV function nl b) RHC (08/2013) RA 4, RV 30/5/7, PA 25/10 (16), PCWP 7, Fick CO/CI 6.3/2.7, PVR 1.5 WU, PA 61 and 66%    Daily headache    "~ every other day; since I fell in June" (03/15/2014)   Depression    Dyslipidemia    Dyspnea    uses oxygen qhs via [place]   ESRD (end stage renal disease) ([place])    Dialysis on Tues Thurs Sat   Exertional shortness of breath    History of kidney stones    HTN (hypertension)    Hypothyroidism    Neuropathy    Obesity    Osteoarthritis    PAF (paroxysmal atrial fibrillation) (HCC)    Peripheral neuropathy    bilateral feet/hands   PONV (postoperative nausea and vomiting)    RBBB (right bundle branch block)    Old   Stroke ([place])    mini strokes   Syncope    likely due to low blood sugar   Tachycardia    Sinus tachycardia   Type II diabetes mellitus (HCC)    Type II, Lamar Sprinkles libre left upper arm. patient has omnipod insulin pump with Novolin R Insulin   Urinary incontinence    Venous insufficiency      Significant Hospital Events: Including procedures, antibiotic start and stop dates in addition to other pertinent events   05/23/22 Flex Sig, cautery/clipping rectal ulcers   Interim History / Subjective:  C/o of some SOB with jaw and lower back pain.  EKG was non-acute, CXR neg, and remained hemodynamically stable.   Resolved spont.   Tolerating  liquids, c/o mild nausea but hungry Small brown smear this morning, H/H stable  Objective   Blood pressure (!) 112/32, pulse 85, temperature 97.7 F (36.5 C), temperature source Oral, resp. rate 16, weight 94.5 kg, SpO2 100 %.        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  Does not meet testing criteria 1/1.  P:  - ok to transfer to floor and to [place] as of 1/2 - advance diet as tolerated per CCS - IV team to establish PIV then remove CVL - avoid constipation  - trend CBC - cont to hold [facility] and antiplatelets for now - iHD per Nephrology, next planned 1/2 - cont midodrine - cont SSI AC/ HS - DNR       Kennieth Rad, ANCP [facility] 05/26/2022, 12:46 PM  See Amion for pager If no response to pager, please call PCCM consult pager After 7:00 pm call Elink

## 2022-05-26 NOTE — Progress Notes (Signed)
[facility] Progress Note  2 Days Post-Op  Subjective: CC-  Continues to have some rectal pain. Denies abdominal pain. Tolerating clear liquids. No bright red blood per rectum. She had a very small brown BM this morning.  Hgb stable at 9.7. she has not required any further blood transfusion since 12/30  Objective: Vital signs in last 24 hours: Temp:  [97.2 F (36.2 C)-98.3 F (36.8 C)] 97.7 F (36.5 C) (01/01 0800) Pulse Rate:  [70-92] 85 (01/01 0800) Resp:  [5-19] 16 (01/01 0800) BP: (105-149)/(24-79) 112/32 (01/01 0800) SpO2:  [95 %-100 %] 100 % (01/01 0800) Weight:  [94.5 kg-98 kg] 94.5 kg (12/31 1816) Last BM Date : 05/25/22  Intake/Output from previous day: 12/31 0701 - 01/01 0700 In: 580 [P.O.:580] Out: 3500  Intake/Output this shift: No intake/output data recorded.  PE: Gen:  Alert, NAD Card:  RRR Pulm:  rate and effort normal Abd: Soft, NT/ND Ext:  s/p right BKA with cdi present  Lab Results:  Recent Labs    05/25/22 1640 05/26/22 0410  WBC 14.4* 13.2*  HGB 10.3* 9.7*  HCT 31.6* 30.1*  PLT 246 247   BMET Recent Labs    05/25/22 0558 05/26/22 0407  NA 130* 133*  K 3.8 3.3*  CL 97* 97*  CO2 26 27  GLUCOSE 222* 159*  BUN 50* 32*  CREATININE 3.67* 2.92*  CALCIUM 7.8* 8.2*   PT/INR No results for input(s): "LABPROT", "INR" in the last 72 hours. CMP     Component Value Date/Time   NA 133 (L) 05/26/2022 0407   K 3.3 (L) 05/26/2022 0407   CL 97 (L) 05/26/2022 0407   CO2 27 05/26/2022 0407   GLUCOSE 159 (H) 05/26/2022 0407   GLUCOSE >444 (H) 06/07/2015 1311   BUN 32 (H) 05/26/2022 0407   CREATININE 2.92 (H) 05/26/2022 0407   CREATININE 6.22 (H) 03/26/2022 1620   CALCIUM 8.2 (L) 05/26/2022 0407   PROT 5.9 (L) 05/22/2022 2220   ALBUMIN 2.1 (L) 05/22/2022 2220   AST 25 05/22/2022 2220   ALT 9 05/22/2022 2220   ALKPHOS 110 05/22/2022 2220   BILITOT <0.1 (L) 05/22/2022 2220   GFRNONAA 17 (L) 05/26/2022 0407   GFRNONAA 11 (L)  11/15/2020 1452   GFRAA 13 (L) 11/15/2020 1452   Lipase     Component Value Date/Time   LIPASE 60 (H) 02/02/2022 0121       Studies/Results: No results found.  Anti-infectives: Anti-infectives (From admission, onward)    Start     Dose/Rate Route Frequency Ordered Stop   05/23/22 1800  vancomycin (VANCOCIN) capsule 125 mg  Status:  Discontinued        125 mg Oral 4 times daily 05/23/22 1642 05/25/22 0947   05/23/22 0500  piperacillin-tazobactam (ZOSYN) IVPB 3.375 g  Status:  Discontinued        3.375 g 12.5 mL/hr over 240 Minutes Intravenous Every 8 hours 05/23/22 0422 05/23/22 0423   05/23/22 0500  piperacillin-tazobactam (ZOSYN) IVPB 2.25 g  Status:  Discontinued        2.25 g 100 mL/hr over 30 Minutes Intravenous Every 8 hours 05/23/22 0423 05/24/22 1015        Assessment/Plan Bleeding rectal ulcer - persistent bleeding after endoscopic intervention x2 12/29 and 12/30   -POD#2 s/p exam under anesthesia, oversewing of rectal ulcer 12/30 Dr. Kieth Brightly - Hgb stable at 9.7. last blood transfusion 2u PRBCs (12/30) - 1 very small brown stool this morning, no BRBPR - ok  to advance diet as tolerated   ID - none FEN - CLD VTE - held given ABLA Foley - none   ?C diff A fib on eliquis CAD CHF PVD DM ESRD    LOS: 3 days    [place], [facility] 05/26/2022, 8:57 AM Please see Amion for pager number during day hours 7:00am-4:30pm

## 2022-05-26 NOTE — Evaluation (Signed)
Physical Therapy Evaluation Patient Details Name: Susan [name] MRN: [id number] DOB: February 06, 1951 Today's Date: 05/26/2022  History of Present Illness  72 y.o. female admitted 12/28 with GIB and hypotension. 12/30 oversewing of rectal ulcer. PMHx: 05/02/22 Rt BKA, T2DM, CHF, HTN, CAD, CKD, MI, BLE venous insufficiency, anxiety, ESRD on HD TTS, obesity, OA, CVA, Urinary incontinence  Clinical Impression  Pt pleasant and states she did not get along with the sliding board. Pt able to perform lateral scoot with pad and drop arm with +2 assist. Do not feel pt would be able to return home considering spouse works as a Administrator and return to SNF appropriate. Pt with decreased cognition, strength, function and mobility who will benefit from acute therapy to maximize mobility and safety.     142/73 HR 91 SpO2 98% on RA     Recommendations for follow up therapy are one component of a multi-disciplinary discharge planning process, led by the attending physician.  Recommendations may be updated based on patient status, additional functional criteria and insurance authorization.  Follow Up Recommendations Skilled nursing-short term rehab (<3 hours/day) Can patient physically be transported by private vehicle: No    Assistance Recommended at Discharge Intermittent Supervision/Assistance  Patient can return home with the following  Two people to help with walking and/or transfers;A lot of help with bathing/dressing/bathroom;Assistance with cooking/housework;Direct supervision/assist for medications management;Direct supervision/assist for financial management;Assist for transportation;Help with stairs or ramp for entrance    Equipment Recommendations Hospital bed;Other (comment) (hoyer  lift)  Recommendations for Other Services       Functional Status Assessment Patient has had a recent decline in their functional status and/or demonstrates limited ability to make significant improvements in function  in a reasonable and predictable amount of time     Precautions / Restrictions Precautions Precautions: Fall Precaution Comments: recent R BKA (05/02/22) Required Braces or Orthoses: Other Brace Other Brace: R BKA limb protector- poor skin integrity and not used today Restrictions Weight Bearing Restrictions: Yes RLE Weight Bearing: Non weight bearing      Mobility  Bed Mobility Overal bed mobility: Needs Assistance Bed Mobility: Supine to Sit, Rolling Rolling: Min assist   Supine to sit: Min assist     General bed mobility comments: min assist to roll for hygiene and linen change, min assist to come EOB with PT assist, HOB 20 degrees, cues for sequence    Transfers Overall transfer level: Needs assistance   Transfers: Bed to chair/wheelchair/BSC            Lateral/Scoot Transfers: Mod assist, +2 physical assistance, +2 safety/equipment General transfer comment: cueing for hand placement and sequencing, towards L side into recliner, physical assist of pad to scoot    Ambulation/Gait                  Stairs            Wheelchair Mobility    Modified Rankin (Stroke Patients Only)       Balance Overall balance assessment: Needs assistance Sitting-balance support: No upper extremity supported, Feet supported Sitting balance-Leahy Scale: Fair Sitting balance - Comments: preference for UE support                                     Pertinent Vitals/Pain Pain Assessment Faces Pain Scale: Hurts little more Pain Location: generalized Pain Descriptors / Indicators: Guarding Pain Intervention(s): Limited activity within patient's tolerance, Repositioned,  Monitored during session    Home Living Family/patient expects to be discharged to:: Skilled nursing facility Living Arrangements: Spouse/significant other Available Help at Discharge: Family;Available PRN/intermittently Type of Home: House Home Access: Ramped entrance        Home Layout: One level Home Equipment: Grab bars - tub/shower;Rolling Walker (2 wheels);Rollator (4 wheels);BSC/3in1;Cane - quad;Shower seat Additional Comments: husband works as Administrator during day, typically home by 6p; shower seat does not fit in her tub    Prior Function Prior Level of Function : Needs assist       Physical Assist : Mobility (physical);ADLs (physical)     Mobility Comments: requires lift to transfer OOB since BKA ADLs Comments: requires assist for ADLs bed level, pt reports self feeding, grooming     Hand Dominance   Dominant Hand: Right    Extremity/Trunk Assessment   Upper Extremity Assessment Upper Extremity Assessment: Defer to OT evaluation RUE Deficits / Details: some weeping from R UE, edema noted    Lower Extremity Assessment Lower Extremity Assessment: Generalized weakness;LLE deficits/detail RLE Deficits / Details: Rt BKA limited strength and noted edema, skin injuries presumed due to limb protector LLE Deficits / Details: decreased awareness of position and sensation    Cervical / Trunk Assessment Cervical / Trunk Exceptions: weakness, obesity  Communication   Communication: No difficulties  Cognition Arousal/Alertness: Awake/alert Behavior During Therapy: Anxious Overall Cognitive Status: Impaired/Different from baseline Area of Impairment: Following commands, Awareness, Problem solving, Safety/judgement                       Following Commands: Follows one step commands with increased time, Follows multi-step commands inconsistently Safety/Judgement: Decreased awareness of safety, Decreased awareness of deficits   Problem Solving: Slow processing, Difficulty sequencing, Requires verbal cues General Comments: pt A&O, some decreased awareness of timeline with SNF/hospital.  Pt fearful of falling, but follows simple commands well.        General Comments      Exercises     Assessment/Plan    PT Assessment Patient  needs continued PT services  PT Problem List Decreased strength;Decreased range of motion;Decreased activity tolerance;Decreased balance;Decreased mobility;Decreased cognition;Decreased knowledge of use of DME;Decreased safety awareness;Decreased knowledge of precautions;Decreased skin integrity;Pain;Obesity       PT Treatment Interventions DME instruction;Functional mobility training;Therapeutic activities;Therapeutic exercise;Balance training;Patient/family education;Wheelchair mobility training    PT Goals (Current goals can be found in the Care Plan section)  Acute Rehab PT Goals PT Goal Formulation: With patient Time For Goal Achievement: 06/09/22 Potential to Achieve Goals: Fair    Frequency Min 2X/week     Co-evaluation   Reason for Co-Treatment: For patient/therapist safety;To address functional/ADL transfers   OT goals addressed during session: ADL's and self-care       AM-PAC PT "6 Clicks" Mobility  Outcome Measure Help needed turning from your back to your side while in a flat bed without using bedrails?: A Little Help needed moving from lying on your back to sitting on the side of a flat bed without using bedrails?: A Little Help needed moving to and from a bed to a chair (including a wheelchair)?: Total Help needed standing up from a chair using your arms (e.g., wheelchair or bedside chair)?: Total Help needed to walk in hospital room?: Total Help needed climbing 3-5 steps with a railing? : Total 6 Click Score: 10    End of Session   Activity Tolerance: Patient tolerated treatment well Patient left: in chair;with call bell/phone  within reach;with chair alarm set Nurse Communication: Mobility status PT Visit Diagnosis: Other abnormalities of gait and mobility (R26.89);Muscle weakness (generalized) (M62.81);Pain;History of falling (Z91.81) Pain - Right/Left: Right Pain - part of body: Leg    Time: 9826-4158 PT Time Calculation (min) (ACUTE ONLY): 20  min   Charges:   PT Evaluation $PT Eval Moderate Complexity: 1 Mod          [place], PT Acute Rehabilitation Services Office: [phone]   [name] Salaam Inaara Tye 05/26/2022, 10:17 AM

## 2022-05-26 NOTE — Evaluation (Signed)
Occupational Therapy Evaluation Patient Details Name: Susan [name] MRN: [id number] DOB: [birth year] Today's Date: 05/26/2022   History of Present Illness 72 y.o. female admitted 12/28 with GIB and hypotension. 12/30 oversewing of rectal ulcer. PMHx: 05/02/22 Rt BKA, T2DM, CHF, HTN, CAD, CKD, MI, BLE venous insufficiency, anxiety, ESRD on HD TTS, obesity, OA, CVA, Urinary incontinence   Clinical Impression   Patient admitted from SNF for above and limited by problem list below.  She reports being at [facility] since [place] (05/02/22) needing assist for ADLs at bed level but able to feed and groom herself, mobility with hoyer lift at SNF.  Currently requires min assist for rolling and bed mobility, mod assist +2 for lateral scoot transfers, and setup to total assist +2 for ADLs.  She will benefit from further OT services acutely and after dc at SNF level to optimize independence and decrease burden of care. Will follow acutely.      Recommendations for follow up therapy are one component of a multi-disciplinary discharge planning process, led by the attending physician.  Recommendations may be updated based on patient status, additional functional criteria and insurance authorization.   Follow Up Recommendations  Skilled nursing-short term rehab (<3 hours/day)     Assistance Recommended at Discharge Frequent or constant Supervision/Assistance  Patient can return home with the following Two people to help with walking and/or transfers;Two people to help with bathing/dressing/bathroom    Functional Status Assessment  Patient has had a recent decline in their functional status and demonstrates the ability to make significant improvements in function in a reasonable and predictable amount of time.  Equipment Recommendations  Other (comment) (defer)    Recommendations for Other Services       Precautions / Restrictions Precautions Precautions: Fall Precaution Comments: recent R BKA (05/02/22) Required  Braces or Orthoses: Other Brace Other Brace: R BKA limb protector- poor skin integrity and not used today Restrictions Weight Bearing Restrictions: Yes RLE Weight Bearing: Non weight bearing      Mobility Bed Mobility Overal bed mobility: Needs Assistance Bed Mobility: Supine to Sit, Rolling Rolling: Min assist   Supine to sit: Min assist     General bed mobility comments: min assist to roll for hygiene and linen change, min assist to come EOB with PT assist    Transfers Overall transfer level: Needs assistance Equipment used: None Transfers: Bed to chair/wheelchair/BSC            Lateral/Scoot Transfers: Mod assist, +2 physical assistance, +2 safety/equipment General transfer comment: cueing for hand placement and sequencing, towards L side into recliner      Balance Overall balance assessment: Needs assistance Sitting-balance support: No upper extremity supported, Feet supported Sitting balance-Leahy Scale: Fair Sitting balance - Comments: preference for UE support                                   ADL either performed or assessed with clinical judgement   ADL Overall ADL's : Needs assistance/impaired     Grooming: Set up;Sitting           Upper Body Dressing : Min guard;Sitting   Lower Body Dressing: Total assistance;+2 for physical assistance;+2 for safety/equipment;Sitting/lateral leans;Bed level   Toilet Transfer: Moderate assistance;+2 for physical assistance;+2 for safety/equipment Toilet Transfer Details (indicate cue type and reason): lateral scoot, simulated to [place] and Hygiene: Total assistance;+2 for safety/equipment;+2 for physical assistance;Bed level  Functional mobility during ADLs: Moderate assistance;+2 for physical assistance;+2 for safety/equipment;Cueing for safety;Cueing for sequencing       Vision   Vision Assessment?: No apparent visual deficits     Perception      Praxis      Pertinent Vitals/Pain Pain Assessment Pain Assessment: Faces Faces Pain Scale: Hurts little more Pain Location: generalized Pain Descriptors / Indicators: Guarding Pain Intervention(s): Limited activity within patient's tolerance, Monitored during session, Repositioned     Hand Dominance Right   Extremity/Trunk Assessment Upper Extremity Assessment Upper Extremity Assessment: Generalized weakness;RUE deficits/detail (reports bilateral shoulder dislocations with shoulder flexion to approx 45* only) RUE Deficits / Details: some weeping from R UE, edema noted   Lower Extremity Assessment Lower Extremity Assessment: Defer to PT evaluation       Communication Communication Communication: No difficulties   Cognition Arousal/Alertness: Awake/alert Behavior During Therapy: Anxious (with mobility) Overall Cognitive Status: Impaired/Different from baseline Area of Impairment: Following commands, Awareness, Problem solving, Safety/judgement                       Following Commands: Follows one step commands consistently, Follows one step commands with increased time, Follows multi-step commands inconsistently Safety/Judgement: Decreased awareness of safety, Decreased awareness of deficits Awareness: Emergent Problem Solving: Slow processing, Difficulty sequencing, Requires verbal cues General Comments: pt A&O, some decreased awareness of timeline with SNF/hospital.  Pt fearful of falling, but follows simple commands well.     General Comments       Exercises     Shoulder Instructions      Home Living Family/patient expects to be discharged to:: Skilled nursing facility                                        Prior Functioning/Environment Prior Level of Function : Needs assist             Mobility Comments: requires lift to transfer OOB since BKA ADLs Comments: requires assist for ADLs bed level, pt reports self feeding, grooming         OT Problem List: Decreased strength;Decreased activity tolerance;Impaired balance (sitting and/or standing);Obesity;Decreased safety awareness;Decreased knowledge of use of DME or AE;Decreased knowledge of precautions      OT Treatment/Interventions: Self-care/ADL training;Therapeutic exercise;DME and/or AE instruction;Therapeutic activities;Patient/family education;Balance training    OT Goals(Current goals can be found in the care plan section) Acute Rehab OT Goals Patient Stated Goal: rehab OT Goal Formulation: With patient Time For Goal Achievement: 06/09/22 Potential to Achieve Goals: Fair  OT Frequency: Min 2X/week    Co-evaluation PT/OT/SLP Co-Evaluation/Treatment: Yes Reason for Co-Treatment: For patient/therapist safety;To address functional/ADL transfers   OT goals addressed during session: ADL's and self-care      AM-PAC OT "6 Clicks" Daily Activity     Outcome Measure Help from another person eating meals?: None Help from another person taking care of personal grooming?: A Little Help from another person toileting, which includes using toliet, bedpan, or urinal?: Total Help from another person bathing (including washing, rinsing, drying)?: A Lot Help from another person to put on and taking off regular upper body clothing?: A Little Help from another person to put on and taking off regular lower body clothing?: Total 6 Click Score: 14   End of Session Nurse Communication: Mobility status  Activity Tolerance: Patient tolerated treatment well Patient left: with call bell/phone within reach;in chair;with  chair alarm set  OT Visit Diagnosis: Muscle weakness (generalized) (M62.81);Other abnormalities of gait and mobility (R26.89) Pain - part of body:  (generalized)                Time: 6962-9528 OT Time Calculation (min): 22 min Charges:  OT General Charges $OT Visit: 1 Visit OT Evaluation $OT Eval Moderate Complexity: [place], OT Acute  Rehabilitation Services Office [phone]   Delight Stare 05/26/2022, 9:42 AM

## 2022-05-26 NOTE — Progress Notes (Signed)
eLink Physician-Brief Progress Note Patient Name: Susan [name] DOB: May 28, 1950 MRN: [id number]   Date of Service  05/26/2022  HPI/Events of Note  Patient said she was short of breath. RN ordered CXR. No hemodynamic changes. Also complained of some jaw pain. In ICU for hemorrhagic shock. No distress on camera. EKG does not show acute changes  eICU Interventions  Follow cxr. Monitor.      Intervention Category Intermediate Interventions: Pain - evaluation and management  Masey Scheiber G Najmo Pardue 05/26/2022, 6:15 AM

## 2022-05-26 NOTE — Progress Notes (Signed)
[facility] Progress Note  Subjective: hgb fairly stable from yesterday-  had HD yest-  removed 3.5 liters -  tolerated well -   she is sitting up in chair-  pleasant  Vitals:   05/26/22 0600 05/26/22 0630 05/26/22 0700 05/26/22 0800  BP: (!) 112/24 (!) 119/45 (!) 111/38 (!) 112/32  Pulse: 83 85 78 85  Resp: (!) '9 13 11 16  '$ Temp:    97.7 F (36.5 C)  TempSrc:    Oral  SpO2: 98% 97% 96% 100%  Weight:        Exam: Gen alert, no distress, nasal O2 No jvd or bruits Chest clear bilat to bases RRR no MRG Abd soft quite obese ntnd no mass or ascites +bs MS R BKA Ext diffuse 2+ bilat hip  edema-  right BKA Neuro is lethargic, nonfocal    RIJ TDC intact  (and L arm AVF+ bruit resting d/t infection)      Home meds include - albuterol, pacerone, eliquis, coreg 6.25 bid, fidaxomicin, hydralazine 25 qid prn, insulin aspart, synthroid, linagliptin, pregabalin, renvela 2 ac tid, vortioxetine, prns/ vits/ supps        OP HD: SW TTS  4h  400/800   98.5kg  3K/2.5Ca bath  TDC/ AVF resting due to recent infection - mircera 150 mcg q2, last 12/26 - last Hb 8.8 on 12/21, due 06/03/22 - last HD 12/28, post wt 99.3 - no vdra     Assessment/ Plan: Lower GIB - went to OR for oversewing of a bleeding rectal ulcer  12/30-  hgb fairly stable last 24 hours.  Shock - resolved, off pressors  ESRD - on HD TTS.  HD Sunday for Saturday.  Next HD  Tuesday 1/02 back on schedule.  H/o HTN - we were holding home BP meds due to shock, -  has been started on midodrine as well Volume - sig edema in LE"s, not eating well per dtr for a while. At dry wt. Max UF w/ HD today as tolerated and lower dry wt as tol.  Anemia esrd - Hb 7-10 here, just had esa at OP unit. Follow and transfuse prn.  MBD ckd - CCa in range,  phos 3.4 - no binder Uncont DM - per pmd DNR  Susan [name]  05/26/2022, 9:57 AM   Recent Labs  Lab 05/22/22 2220 05/22/22 2253 05/24/22 1710 05/25/22 0200  05/25/22 0558 05/25/22 1640 05/26/22 0407 05/26/22 0410  HGB 9.0*   < > 7.4*   < > 9.0* 10.3*  --  9.7*  ALBUMIN 2.1*  --   --   --   --   --   --   --   CALCIUM 8.6*   < >  --   --  7.8*  --  8.2*  --   PHOS  --   --  3.3  --   --   --  3.4  --   CREATININE 2.80*   < >  --   --  3.67*  --  2.92*  --   K 3.6   < >  --   --  3.8  --  3.3*  --    < > = values in this interval not displayed.   No results for input(s): "IRON", "TIBC", "FERRITIN" in the last 168 hours. Inpatient medications:  sodium chloride   Intravenous Once   sodium chloride   Intravenous Once   Chlorhexidine Gluconate Cloth  6 each Topical Q0600  docusate sodium  100 mg Oral BID   insulin aspart  0-15 Units Subcutaneous Q4H   leptospermum manuka honey  1 Application Topical Daily   midodrine  10 mg Oral TID with meals   pantoprazole (PROTONIX) IV  40 mg Intravenous Q12H   polyethylene glycol  17 g Oral Daily    norepinephrine (LEVOPHED) Adult infusion Stopped (05/25/22 0401)   acetaminophen, HYDROmorphone (DILAUDID) injection, ondansetron (ZOFRAN) IV, mouth rinse

## 2022-05-27 DIAGNOSIS — I480 Paroxysmal atrial fibrillation: Secondary | ICD-10-CM

## 2022-05-27 DIAGNOSIS — E039 Hypothyroidism, unspecified: Secondary | ICD-10-CM

## 2022-05-27 DIAGNOSIS — N186 End stage renal disease: Secondary | ICD-10-CM | POA: Diagnosis not present

## 2022-05-27 DIAGNOSIS — E1142 Type 2 diabetes mellitus with diabetic polyneuropathy: Secondary | ICD-10-CM

## 2022-05-27 DIAGNOSIS — D62 Acute posthemorrhagic anemia: Secondary | ICD-10-CM

## 2022-05-27 DIAGNOSIS — R197 Diarrhea, unspecified: Secondary | ICD-10-CM | POA: Diagnosis not present

## 2022-05-27 DIAGNOSIS — I5022 Chronic systolic (congestive) heart failure: Secondary | ICD-10-CM | POA: Diagnosis not present

## 2022-05-27 DIAGNOSIS — K626 Ulcer of anus and rectum: Principal | ICD-10-CM

## 2022-05-27 DIAGNOSIS — Z794 Long term (current) use of insulin: Secondary | ICD-10-CM

## 2022-05-27 DIAGNOSIS — Z992 Dependence on renal dialysis: Secondary | ICD-10-CM

## 2022-05-27 DIAGNOSIS — Z89511 Acquired absence of right leg below knee: Secondary | ICD-10-CM

## 2022-05-27 LAB — BASIC METABOLIC PANEL
Anion gap: 22 — ABNORMAL HIGH (ref 5–15)
BUN: 43 mg/dL — ABNORMAL HIGH (ref 8–23)
CO2: 15 mmol/L — ABNORMAL LOW (ref 22–32)
Calcium: 8.4 mg/dL — ABNORMAL LOW (ref 8.9–10.3)
Chloride: 94 mmol/L — ABNORMAL LOW (ref 98–111)
Creatinine, Ser: 4.19 mg/dL — ABNORMAL HIGH (ref 0.44–1.00)
GFR, Estimated: 11 mL/min — ABNORMAL LOW (ref >60)
Glucose, Bld: 407 mg/dL — ABNORMAL HIGH (ref 70–99)
Potassium: 4.4 mmol/L (ref 3.5–5.1)
Sodium: 131 mmol/L — ABNORMAL LOW (ref 135–145)

## 2022-05-27 LAB — CBC
HCT: 31.5 % — ABNORMAL LOW (ref 36.0–46.0)
Hemoglobin: 9.6 g/dL — ABNORMAL LOW (ref 12.0–15.0)
MCH: 27.1 pg (ref 26.0–34.0)
MCHC: 30.5 g/dL (ref 30.0–36.0)
MCV: 89 fL (ref 80.0–100.0)
Platelets: 233 10*3/uL (ref 150–400)
RBC: 3.54 MIL/uL — ABNORMAL LOW (ref 3.87–5.11)
RDW: 20.7 % — ABNORMAL HIGH (ref 11.5–15.5)
WBC: 16.2 10*3/uL — ABNORMAL HIGH (ref 4.0–10.5)
nRBC: 0 % (ref 0.0–0.2)

## 2022-05-27 LAB — PHOSPHORUS: Phosphorus: 5.1 mg/dL — ABNORMAL HIGH (ref 2.5–4.6)

## 2022-05-27 LAB — GLUCOSE, CAPILLARY
Glucose-Capillary: 258 mg/dL — ABNORMAL HIGH (ref 70–99)
Glucose-Capillary: 430 mg/dL — ABNORMAL HIGH (ref 70–99)
Glucose-Capillary: 360 mg/dL — ABNORMAL HIGH (ref 70–99)
Glucose-Capillary: 279 mg/dL — ABNORMAL HIGH (ref 70–99)
Glucose-Capillary: 131 mg/dL — ABNORMAL HIGH (ref 70–99)
Glucose-Capillary: 191 mg/dL — ABNORMAL HIGH (ref 70–99)

## 2022-05-27 LAB — MAGNESIUM: Magnesium: 2 mg/dL (ref 1.7–2.4)

## 2022-05-27 MED ORDER — INSULIN GLARGINE-YFGN 100 UNIT/ML ~~LOC~~ SOLN
22.0000 [IU] | Freq: Every day | SUBCUTANEOUS | Status: DC
  Administered 2022-05-27: 22 [IU] via SUBCUTANEOUS
  Filled 2022-05-27 (×2): qty 0.22

## 2022-05-27 MED ORDER — LINAGLIPTIN 5 MG PO TABS
5.0000 mg | ORAL_TABLET | Freq: Every day | ORAL | Status: DC
  Administered 2022-05-27 – 2022-05-29 (×3): 5 mg via ORAL
  Filled 2022-05-27 (×3): qty 1

## 2022-05-27 MED ORDER — INSULIN ASPART 100 UNIT/ML IJ SOLN
15.0000 [IU] | INTRAMUSCULAR | Status: AC
  Administered 2022-05-27: 15 [IU] via SUBCUTANEOUS

## 2022-05-27 MED ORDER — PREGABALIN 75 MG PO CAPS
75.0000 mg | ORAL_CAPSULE | Freq: Every day | ORAL | Status: DC
  Administered 2022-05-27 – 2022-05-29 (×3): 75 mg via ORAL
  Filled 2022-05-27 (×3): qty 1

## 2022-05-27 MED ORDER — ALTEPLASE 2 MG IJ SOLR: 2.0000 mg | Freq: Once | INTRAMUSCULAR | Status: DC | PRN

## 2022-05-27 MED ORDER — ANTICOAGULANT SODIUM CITRATE 4% (200MG/5ML) IV SOLN: 5.0000 mL | Status: DC | PRN

## 2022-05-27 MED ORDER — HEPARIN SODIUM (PORCINE) 1000 UNIT/ML DIALYSIS
1000.0000 [IU] | INTRAMUSCULAR | Status: DC | PRN
  Administered 2022-05-27: 1000 [IU]
  Filled 2022-05-27: qty 1

## 2022-05-27 MED ORDER — AMIODARONE HCL 200 MG PO TABS
200.0000 mg | ORAL_TABLET | Freq: Every day | ORAL | Status: DC
  Administered 2022-05-27 – 2022-05-29 (×3): 200 mg via ORAL
  Filled 2022-05-27 (×3): qty 1

## 2022-05-27 MED ORDER — LEVOTHYROXINE SODIUM 50 MCG PO TABS
50.0000 ug | ORAL_TABLET | Freq: Every day | ORAL | Status: DC
  Administered 2022-05-28 – 2022-05-29 (×2): 50 ug via ORAL
  Filled 2022-05-27 (×2): qty 1

## 2022-05-27 NOTE — Progress Notes (Signed)
[facility] Progress Note  Subjective: hgb fairly stable from yesterday-    moved out of ICU-  she is weak and appetite is poor  Vitals:   05/26/22 1920 05/26/22 2250 05/27/22 0444 05/27/22 0552  BP: (!) 135/55  (!) 114/41 (!) 96/32  Pulse: (!) 111 99 (!) 106 99  Resp:   18 17  Temp:   98.2 F (36.8 C) 98.2 F (36.8 C)  TempSrc:   Oral   SpO2: 100% 100% 97% (!) 73%  Weight:      Height:        Exam: Gen alert, no distress, nasal O2 No jvd or bruits Chest clear bilat to bases RRR no MRG Abd soft quite obese ntnd no mass or ascites +bs MS R BKA Ext diffuse 2+ bilat hip  edema-  right BKA Neuro is lethargic, nonfocal    RIJ TDC intact  (and L arm AVF+ bruit resting d/t infection)      Home meds include - albuterol, pacerone, eliquis, coreg 6.25 bid, fidaxomicin, hydralazine 25 qid prn, insulin aspart, synthroid, linagliptin, pregabalin, renvela 2 ac tid, vortioxetine, prns/ vits/ supps        OP HD: SW TTS  4h  400/800   98.5kg  3K/2.5Ca bath  TDC/ AVF resting due to recent infection - mircera 150 mcg q2, last 12/26 - last Hb 8.8 on 12/21, due 06/03/22 - last HD 12/28, post wt 99.3 - no vdra     Assessment/ Plan: Lower GIB - went to OR for oversewing of a bleeding rectal ulcer  12/30-  hgb fairly stable last 24 hours.  Shock - resolved, off pressors  ESRD - on HD TTS.  HD Sunday for Saturday.  Next HD  Tuesday ( today) to get back on schedule.  H/o HTN - we were holding home BP meds due to shock, -  has been started on midodrine as well Volume - sig edema in LE"s, not eating well per dtr for a while. At dry wt. Max UF w/ HD as tolerated and lower dry wt as able Anemia esrd - Hb 7-10 here, just had esa at OP unit. Follow and transfuse prn.  MBD ckd - CCa in range,  phos 3.4 - no binder Uncont DM - per pmd DNR  Louis Meckel  05/27/2022, 8:43 AM   Recent Labs  Lab 05/22/22 2220 05/22/22 2253 05/26/22 0407 05/26/22 0410 05/27/22 0321   HGB 9.0*   < >  --  9.7* 9.6*  ALBUMIN 2.1*  --   --   --   --   CALCIUM 8.6*   < > 8.2*  --  8.4*  PHOS  --    < > 3.4  --  5.1*  CREATININE 2.80*   < > 2.92*  --  4.19*  K 3.6   < > 3.3*  --  4.4   < > = values in this interval not displayed.   No results for input(s): "IRON", "TIBC", "FERRITIN" in the last 168 hours. Inpatient medications:  sodium chloride   Intravenous Once   sodium chloride   Intravenous Once   Chlorhexidine Gluconate Cloth  6 each Topical Q0600   docusate sodium  100 mg Oral BID   insulin aspart  0-15 Units Subcutaneous TID WC   leptospermum manuka honey  1 Application Topical Daily   midodrine  10 mg Oral TID with meals   pantoprazole (PROTONIX) IV  40 mg Intravenous Q12H   polyethylene glycol  17 g Oral Daily     acetaminophen, HYDROmorphone (DILAUDID) injection, ondansetron (ZOFRAN) IV, mouth rinse

## 2022-05-27 NOTE — Progress Notes (Signed)
Pt receives out-pt HD at [facility] SW on TTS. Pt has a 7:15 am chair time. Will assist as needed.   Melven Sartorius Renal Navigator [phone]

## 2022-05-27 NOTE — Progress Notes (Signed)
   05/27/22 1819  Vitals  Temp 98.2 F (36.8 C)  Temp Source Oral  BP (!) 119/48  MAP (mmHg) 71  BP Location Right Arm  BP Method Automatic  Patient Position (if appropriate) Lying  Pulse Rate 93  Pulse Rate Source Monitor  ECG Heart Rate 94  Resp 10  Oxygen Therapy  SpO2 100 %  O2 Device Nasal Cannula  O2 Flow Rate (L/min) 2 L/min  Patient Activity (if Appropriate) In bed  Pulse Oximetry Type Continuous   Received patient in bed to unit.  Alert and oriented.  Informed consent signed and in chart.   Treatment initiated: 1450 Treatment completed: 1810  Patient tolerated well.  Transported back to the room  Alert, without acute distress.  Hand-off given to patient's nurse.   Access used: HD cath Access issues: NA  Total UF removed: 3558m Medication(s) given: Heparin Dwells 3800 units Post HD VS: see above Post HD weight: 105.5kg   [facility]

## 2022-05-27 NOTE — Progress Notes (Signed)
CSW spoke with MD who states patient will be medically ready for discharge back to SNF tomorrow.  CSW spoke with Marlowe Kays at [facility] to [employer] authorization with a start date of tomorrow.  CSW informed Perrin Smack at [facility] of information.  Madilyn Fireman, MSW, LCSW Transitions of Care  Clinical Social Worker II [phone]

## 2022-05-27 NOTE — Progress Notes (Signed)
PROGRESS NOTE    Susan [name]  GNF:[id number] DOB: 1950-07-13 DOA: 05/22/2022 PCP: [name], MD   Brief Narrative:  72 year old female with end-stage renal disease on hemodialysis, coronary artery disease, paroxysmal A-fib on Eliquis, diabetes and hypertension was brought in the emergency department with complaint of abdominal pain and diarrhea for last few days.  Patient stated, that few days ago she started with dark bloody bowel movements initially it was 2-3 times a day but yesterday it happened 6-7 times and she felt weak so was brought to the emergency department.  Initially she was found to be hypotensive, requiring IV fluid with improvement in blood pressure, in the emergency department she had multiple large bowel movement containing clots.  GI was consulted they recommend CT angiogram abdomen and pelvis, initial CT angio was negative.  Patient is compliant with her Eliquis, she received Kcentra for anticoagulation reversal, as she was actively bleeding, she was admitted in ICU under PCCM care, subsequently transferred under [facility] on 1-24.  Details as below.  Assessment & Plan:   Active Problems:   Hypotension   Chronic HFrEF (heart failure with reduced ejection fraction) (HCC)   Hypothyroidism   Acute blood loss anemia   Type 2 diabetes mellitus with diabetic polyneuropathy, with long-term current use of insulin (HCC)   Paroxysmal atrial fibrillation (HCC)   Diarrhea   S/P BKA (below knee amputation) unilateral, right (HCC)   GI bleed   Difficult intravenous access   Rectal ulcer   ESRD on dialysis ([place])  Acute blood loss anemia secondary to acute lower GI bleed/rectal ulcer: Presented with active lower GI bleed, CTA was negative for active bleeding, GI consulted, they tried sigmoidoscopy x 2 on 11/29 and 11/30 which were failed and subsequently general surgery was consulted and patient underwent exam under anesthesia and oversewing of rectal ulcer on 05/24/2022 with Dr.  Kieth Brightly.  Patient has received total of 4 units of PRBC transfusion during this hospitalization.  Patient's hemoglobin has remained stable since 05/26/2022.  GI as well as general surgery has signed off.  Patient complains of diarrhea but nonbloody.  Chronic diarrhea: Per PCCM, her history consistently has been variable, C. difficile ordered on admit but never sent.  Per them, she does not meet criteria for C. difficile anymore.  She is afebrile with no leukocytosis.  Chronic hypoxic respiratory failure secondary to chronic systolic congestive heart failure: She tells me that she uses 2 to 3 L of oxygen at home, she is at baseline.  She appears to be euvolemic.  She is on dialysis.  ESRD on HD: Nephrology on board.  She is on TTS schedule.  Paroxysmal atrial fibrillation: Patient appears to be taking Coreg and amiodarone at home, both of them are on hold.  Patient's blood pressure is on the low normal side so I will resume only amiodarone.  Eliquis was reversed and currently on hold due to bleeding, will need to rechallenge next couple of weeks.  Aspirin on hold as well.  Acquired hypothyroidism: Resume Synthroid.  Uncontrolled type 2 diabetes mellitus with hyperglycemia: Patient uses insulin pump at home which is on hold at the moment.  She is hyperglycemic, seen by diabetes educator, will start on Lantus 22 units and resume Tradjenta.  Continue SSI.  History of essential hypertension: Currently slightly hypotensive.  Continue to hold Coreg and hydralazine.  Peripheral neuropathy: Resume home medications.  Right leg venous ulcer bleeding status post right BKA:  History of recurrent C. difficile colitis: He saw  ID on 03/26/2022 and was prescribed fidaxomicin which she completed therapy on 04/06/2022.  If she were to continue to have diarrhea, with will retest for C. difficile.  Disposition: Seen by PT OT they recommend SNF.  I have consulted TOC.  DVT prophylaxis: SCDs Start: 05/23/22 0417    Code Status: DNR  Family Communication:  None present at bedside.  Plan of care discussed with patient in length and he/she verbalized understanding and agreed with it.  Status is: Inpatient Remains inpatient appropriate because: Improving but needs further monitoring for diarrhea and she is still having some nausea.   Estimated body mass index is 34.67 kg/m as calculated from the following:   Height as of this encounter: '5\' 5"'$  (1.651 m).   Weight as of this encounter: 94.5 kg.  Pressure Injury 05/23/22 Buttocks Right Deep Tissue Pressure Injury - Purple or maroon localized area of discolored intact skin or blood-filled blister due to damage of underlying soft tissue from pressure and/or shear. (Active)  05/23/22 1715  Location: Buttocks  Location Orientation: Right  Staging: Deep Tissue Pressure Injury - Purple or maroon localized area of discolored intact skin or blood-filled blister due to damage of underlying soft tissue from pressure and/or shear.  Wound Description (Comments):   Present on Admission: Yes  Dressing Type Foam - Lift dressing to assess site every shift 05/24/22 1600     Pressure Injury 05/23/22 Right;Left;Posterior Stage 1 -  Intact skin with non-blanchable redness of a localized area usually over a bony prominence. (Active)  05/23/22   Location:   Location Orientation: Right;Left;Posterior  Staging: Stage 1 -  Intact skin with non-blanchable redness of a localized area usually over a bony prominence.  Wound Description (Comments):   Present on Admission: Yes  Dressing Type Foam - Lift dressing to assess site every shift 05/26/22 1430   Nutritional Assessment: Body mass index is 34.67 kg/m.Marland Kitchen Seen by dietician.  I agree with the assessment and plan as outlined below: Nutrition Status:        . Skin Assessment: I have examined the patient's skin and I agree with the wound assessment as performed by the wound care RN as outlined below: Pressure Injury  05/23/22 Buttocks Right Deep Tissue Pressure Injury - Purple or maroon localized area of discolored intact skin or blood-filled blister due to damage of underlying soft tissue from pressure and/or shear. (Active)  05/23/22 1715  Location: Buttocks  Location Orientation: Right  Staging: Deep Tissue Pressure Injury - Purple or maroon localized area of discolored intact skin or blood-filled blister due to damage of underlying soft tissue from pressure and/or shear.  Wound Description (Comments):   Present on Admission: Yes  Dressing Type Foam - Lift dressing to assess site every shift 05/24/22 1600     Pressure Injury 05/23/22 Right;Left;Posterior Stage 1 -  Intact skin with non-blanchable redness of a localized area usually over a bony prominence. (Active)  05/23/22   Location:   Location Orientation: Right;Left;Posterior  Staging: Stage 1 -  Intact skin with non-blanchable redness of a localized area usually over a bony prominence.  Wound Description (Comments):   Present on Admission: Yes  Dressing Type Foam - Lift dressing to assess site every shift 05/26/22 1430    Consultants:  GI-signed off General surgery-signed off  Procedures:  As above  Antimicrobials:  Anti-infectives (From admission, onward)    Start     Dose/Rate Route Frequency Ordered Stop   05/23/22 1800  vancomycin (VANCOCIN) capsule 125 mg  Status:  Discontinued        125 mg Oral 4 times daily 05/23/22 1642 05/25/22 0947   05/23/22 0500  piperacillin-tazobactam (ZOSYN) IVPB 3.375 g  Status:  Discontinued        3.375 g 12.5 mL/hr over 240 Minutes Intravenous Every 8 hours 05/23/22 0422 05/23/22 0423   05/23/22 0500  piperacillin-tazobactam (ZOSYN) IVPB 2.25 g  Status:  Discontinued        2.25 g 100 mL/hr over 30 Minutes Intravenous Every 8 hours 05/23/22 0423 05/24/22 1015         Subjective: Patient seen and examined.  She complains of mild abdominal pain and some nausea but no vomiting.  She states that  she is still having diarrhea.  No fever.  Objective: Vitals:   05/26/22 2250 05/27/22 0444 05/27/22 0552 05/27/22 0848  BP:  (!) 114/41 (!) 96/32 (!) 94/29  Pulse: 99 (!) 106 99 92  Resp:  '18 17 18  '$ Temp:  98.2 F (36.8 C) 98.2 F (36.8 C) 97.7 F (36.5 C)  TempSrc:  Oral  Oral  SpO2: 100% 97% (!) 73% 100%  Weight:      Height:       No intake or output data in the 24 hours ending 05/27/22 0958 Filed Weights   05/25/22 0400 05/25/22 1435 05/25/22 1816  Weight: 98 kg 98 kg 94.5 kg    Examination:  General exam: Appears calm and comfortable  Respiratory system: Clear to auscultation. Respiratory effort normal. Cardiovascular system: S1 & S2 heard, RRR. No JVD, murmurs, rubs, gallops or clicks. No pedal edema. Gastrointestinal system: Abdomen is nondistended, soft and nontender. No organomegaly or masses felt. Normal bowel sounds heard. Central nervous system: Alert and oriented. No focal neurological deficits. Extremities: Right BKA. Psychiatry: Judgement and insight appear normal. Mood & affect appropriate.    Data Reviewed: I have personally reviewed following labs and imaging studies  CBC: Recent Labs  Lab 05/22/22 2220 05/22/22 2253 05/23/22 0424 05/23/22 1811 05/24/22 1710 05/25/22 0200 05/25/22 0558 05/25/22 1640 05/26/22 0410 05/27/22 0321  WBC 9.4  --  9.8   < > 10.8*  --  10.4 14.4* 13.2* 16.2*  NEUTROABS 7.6  --  7.8*  --   --   --   --   --   --   --   HGB 9.0*   < > 7.8*   < > 7.4* 9.6* 9.0* 10.3* 9.7* 9.6*  HCT 32.1*   < > 25.9*   < > 22.8* 28.4* 27.7* 31.6* 30.1* 31.5*  MCV 85.1  --  85.5   < > 83.2  --  84.5 83.4 83.8 89.0  PLT 265  --  258   < > 210  --  180 246 247 233   < > = values in this interval not displayed.   Basic Metabolic Panel: Recent Labs  Lab 05/22/22 2220 05/22/22 2253 05/24/22 0409 05/24/22 1710 05/25/22 0558 05/26/22 0407 05/27/22 0321  NA 131* 133* 131*  --  130* 133* 131*  K 3.6 3.6 3.2*  --  3.8 3.3* 4.4  CL 96*  94* 98  --  97* 97* 94*  CO2 24  --  26  --  26 27 15*  GLUCOSE 344* 357* 174*  --  222* 159* 407*  BUN 26* 28* 40*  --  50* 32* 43*  CREATININE 2.80* 2.70* 3.24*  --  3.67* 2.92* 4.19*  CALCIUM 8.6*  --  8.1*  --  7.8* 8.2* 8.4*  MG  --   --   --   --   --  2.1 2.0  PHOS  --   --   --  3.3  --  3.4 5.1*   GFR: Estimated Creatinine Clearance: 13.8 mL/min (A) (by C-G formula based on SCr of 4.19 mg/dL (H)). Liver Function Tests: Recent Labs  Lab 05/22/22 2220  AST 25  ALT 9  ALKPHOS 110  BILITOT <0.1*  PROT 5.9*  ALBUMIN 2.1*   No results for input(s): "LIPASE", "AMYLASE" in the last 168 hours. No results for input(s): "AMMONIA" in the last 168 hours. Coagulation Profile: Recent Labs  Lab 05/22/22 2220  INR 1.4*   Cardiac Enzymes: No results for input(s): "CKTOTAL", "CKMB", "CKMBINDEX", "TROPONINI" in the last 168 hours. BNP (last 3 results) No results for input(s): "PROBNP" in the last 8760 hours. HbA1C: No results for input(s): "HGBA1C" in the last 72 hours. CBG: Recent Labs  Lab 05/26/22 1630 05/26/22 1922 05/26/22 2053 05/27/22 0605 05/27/22 0847  GLUCAP 265* 254* 218* 430* 360*   Lipid Profile: No results for input(s): "CHOL", "HDL", "LDLCALC", "TRIG", "CHOLHDL", "LDLDIRECT" in the last 72 hours. Thyroid Function Tests: No results for input(s): "TSH", "T4TOTAL", "FREET4", "T3FREE", "THYROIDAB" in the last 72 hours. Anemia Panel: No results for input(s): "VITAMINB12", "FOLATE", "FERRITIN", "TIBC", "IRON", "RETICCTPCT" in the last 72 hours. Sepsis Labs: Recent Labs  Lab 05/23/22 0250  LATICACIDVEN 1.5    Recent Results (from the past 240 hour(s))  MRSA Next Gen by PCR, Nasal     Status: None   Collection Time: 05/23/22  4:12 PM   Specimen: Nasal Mucosa; Nasal Swab  Result Value Ref Range Status   MRSA by PCR Next Gen NOT DETECTED NOT DETECTED Final    Comment: (NOTE) The GeneXpert MRSA Assay (FDA approved for NASAL specimens only), is one component  of a comprehensive MRSA colonization surveillance program. It is not intended to diagnose MRSA infection nor to guide or monitor treatment for MRSA infections. Test performance is not FDA approved in patients less than 46 years old. Performed at [facility], [place] [address] [place], [place] [address]   Gastrointestinal Panel by PCR , Stool     Status: None   Collection Time: 05/24/22 11:37 AM   Specimen: Stool  Result Value Ref Range Status   Campylobacter species NOT DETECTED NOT DETECTED Final   Plesimonas shigelloides NOT DETECTED NOT DETECTED Final   Salmonella species NOT DETECTED NOT DETECTED Final   Yersinia enterocolitica NOT DETECTED NOT DETECTED Final   Vibrio species NOT DETECTED NOT DETECTED Final   Vibrio cholerae NOT DETECTED NOT DETECTED Final   Enteroaggregative E coli (EAEC) NOT DETECTED NOT DETECTED Final   Enteropathogenic E coli (EPEC) NOT DETECTED NOT DETECTED Final   Enterotoxigenic E coli (ETEC) NOT DETECTED NOT DETECTED Final   Shiga like toxin producing E coli (STEC) NOT DETECTED NOT DETECTED Final   Shigella/Enteroinvasive E coli (EIEC) NOT DETECTED NOT DETECTED Final   Cryptosporidium NOT DETECTED NOT DETECTED Final   Cyclospora cayetanensis NOT DETECTED NOT DETECTED Final   Entamoeba histolytica NOT DETECTED NOT DETECTED Final   Giardia lamblia NOT DETECTED NOT DETECTED Final   Adenovirus F40/41 NOT DETECTED NOT DETECTED Final   Astrovirus NOT DETECTED NOT DETECTED Final   Norovirus GI/GII NOT DETECTED NOT DETECTED Final   Rotavirus A NOT DETECTED NOT DETECTED Final   Sapovirus (I, II, IV, and V) NOT DETECTED NOT DETECTED Final  Comment: Performed at [facility], [address], [place], [place] [address]     Radiology Studies: DG CHEST PORT 1 VIEW  Result Date: 05/26/2022 CLINICAL DATA:  Shortness of breath EXAM: PORTABLE CHEST 1 VIEW COMPARISON:  May 23, 2022 FINDINGS: The left and right central lines terminate in the SVC. No  pneumothorax. No pulmonary nodules, infiltrates, or overt edema. The cardiomediastinal silhouette is stable. No other acute abnormalities are identified. IMPRESSION: 1. Support apparatus as above. 2. No acute abnormalities. Electronically Signed   By: Dorise Bullion III M.D.   On: 05/26/2022 11:05    Scheduled Meds:  sodium chloride   Intravenous Once   sodium chloride   Intravenous Once   Chlorhexidine Gluconate Cloth  6 each Topical Q0600   docusate sodium  100 mg Oral BID   insulin aspart  0-15 Units Subcutaneous TID WC   insulin glargine-yfgn  22 Units Subcutaneous Daily   leptospermum manuka honey  1 Application Topical Daily   midodrine  10 mg Oral TID with meals   pantoprazole (PROTONIX) IV  40 mg Intravenous Q12H   polyethylene glycol  17 g Oral Daily   Continuous Infusions:   LOS: 4 days   Darliss Cheney, MD [facility]  05/27/2022, 9:58 AM   *Please note that this is a verbal dictation therefore any spelling or grammatical errors are due to the "[place] [employer]" system interpretation.  Please page via [place] and do not message via secure chat for urgent patient care matters. Secure chat can be used for non urgent patient care matters.  How to contact the [facility] Attending or Consulting provider [place] or covering provider during after hours [place], for this patient?  Check the care team in [facility] and look for a) attending/consulting [facility] provider listed and b) the [facility] team listed. Page or secure chat [phone]. Log into [email or web address] and use [place]'s universal password to access. If you do not have the password, please contact the hospital operator. Locate the [facility] provider you are looking for under [facility] and page to a number that you can be directly reached. If you still have difficulty reaching the provider, please page the [facility] (Director on Call) for the Hospitalists listed on amion for assistance.

## 2022-05-27 NOTE — Progress Notes (Signed)
[facility] Progress Note  3 Days Post-Op  Subjective: CC-  No complaints this am, denies hematochezia  Hgb stable at 9.6. she has not required any further blood transfusion since 12/30  Objective: Vital signs in last 24 hours: Temp:  [98.2 F (36.8 C)-98.6 F (37 C)] 98.2 F (36.8 C) (01/02 0552) Pulse Rate:  [79-111] 99 (01/02 0552) Resp:  [7-21] 17 (01/02 0552) BP: (96-135)/(32-70) 96/32 (01/02 0552) SpO2:  [73 %-100 %] 73 % (01/02 0552) Last BM Date : 05/27/22  Intake/Output from previous day: No intake/output data recorded. Intake/Output this shift: No intake/output data recorded.  PE: Gen:  Alert, NAD Card:  RRR Pulm:  rate and effort normal Abd: Soft, NT/ND Ext:  s/p right BKA   Lab Results:  Recent Labs    05/26/22 0410 05/27/22 0321  WBC 13.2* 16.2*  HGB 9.7* 9.6*  HCT 30.1* 31.5*  PLT 247 233    BMET Recent Labs    05/26/22 0407 05/27/22 0321  NA 133* 131*  K 3.3* 4.4  CL 97* 94*  CO2 27 15*  GLUCOSE 159* 407*  BUN 32* 43*  CREATININE 2.92* 4.19*  CALCIUM 8.2* 8.4*    PT/INR No results for input(s): "LABPROT", "INR" in the last 72 hours. CMP     Component Value Date/Time   NA 131 (L) 05/27/2022 0321   K 4.4 05/27/2022 0321   CL 94 (L) 05/27/2022 0321   CO2 15 (L) 05/27/2022 0321   GLUCOSE 407 (H) 05/27/2022 0321   GLUCOSE >444 (H) 06/07/2015 1311   BUN 43 (H) 05/27/2022 0321   CREATININE 4.19 (H) 05/27/2022 0321   CREATININE 6.22 (H) 03/26/2022 1620   CALCIUM 8.4 (L) 05/27/2022 0321   PROT 5.9 (L) 05/22/2022 2220   ALBUMIN 2.1 (L) 05/22/2022 2220   AST 25 05/22/2022 2220   ALT 9 05/22/2022 2220   ALKPHOS 110 05/22/2022 2220   BILITOT <0.1 (L) 05/22/2022 2220   GFRNONAA 11 (L) 05/27/2022 0321   GFRNONAA 11 (L) 11/15/2020 1452   GFRAA 13 (L) 11/15/2020 1452   Lipase     Component Value Date/Time   LIPASE 60 (H) 02/02/2022 0121       Studies/Results: DG CHEST PORT 1 VIEW  Result Date:  05/26/2022 CLINICAL DATA:  Shortness of breath EXAM: PORTABLE CHEST 1 VIEW COMPARISON:  May 23, 2022 FINDINGS: The left and right central lines terminate in the SVC. No pneumothorax. No pulmonary nodules, infiltrates, or overt edema. The cardiomediastinal silhouette is stable. No other acute abnormalities are identified. IMPRESSION: 1. Support apparatus as above. 2. No acute abnormalities. Electronically Signed   By: Dorise Bullion III M.D.   On: 05/26/2022 11:05    Anti-infectives: Anti-infectives (From admission, onward)    Start     Dose/Rate Route Frequency Ordered Stop   05/23/22 1800  vancomycin (VANCOCIN) capsule 125 mg  Status:  Discontinued        125 mg Oral 4 times daily 05/23/22 1642 05/25/22 0947   05/23/22 0500  piperacillin-tazobactam (ZOSYN) IVPB 3.375 g  Status:  Discontinued        3.375 g 12.5 mL/hr over 240 Minutes Intravenous Every 8 hours 05/23/22 0422 05/23/22 0423   05/23/22 0500  piperacillin-tazobactam (ZOSYN) IVPB 2.25 g  Status:  Discontinued        2.25 g 100 mL/hr over 30 Minutes Intravenous Every 8 hours 05/23/22 0423 05/24/22 1015        Assessment/Plan Bleeding rectal ulcer - persistent bleeding after  endoscopic intervention x2 12/29 and 12/30   -POD#3 s/p exam under anesthesia, oversewing of rectal ulcer 12/30 Dr. Kieth Brightly - Hgb stable at 9.6. last blood transfusion 2u PRBCs (12/30) - 1 very small brown stool 1/1, no further hematochezia   ID - none FEN -  advance per primary VTE - held given ABLA Foley - none   ?C diff A fib on eliquis CAD CHF PVD DM ESRD  General surgery team will sign off. Please call with questions or concerns.    LOS: 4 days    Clovis Riley, [facility] 05/27/2022, 8:19 AM Please see Amion for pager number during day hours 7:00am-4:30pm

## 2022-05-27 NOTE — Care Management Important Message (Signed)
Important Message  Patient Details  Name: Susan [name] MRN: [id number] Date of Birth: Apr 13, 1951   Medicare Important Message Given:  Yes     Devera Englander [name] 05/27/2022, 3:16 PM

## 2022-05-27 NOTE — Progress Notes (Signed)
Inpatient Diabetes Program Recommendations  AACE/ADA: New Consensus Statement on Inpatient Glycemic Control (2015)  Target Ranges:  Prepandial:   less than 140 mg/dL      Peak postprandial:   less than 180 mg/dL (1-2 hours)      Critically ill patients:  140 - 180 mg/dL   Lab Results  Component Value Date   GLUCAP 360 (H) 05/27/2022   HGBA1C 8.9 (H) 05/02/2022    Review of Glycemic Control  Latest Reference Range & Units 05/26/22 19:22 05/26/22 20:53 05/27/22 06:05 05/27/22 08:47  Glucose-Capillary 70 - 99 mg/dL 254 (H) 218 (H) 430 (H) 360 (H)   Diabetes history: DM  Outpatient Diabetes medications:  While in rehab-? OmniPod with U100 Regular insulin (total basal 25.9 units/day, Carb coverage 1:12 grams, Sensitivity factor 1:40 mg/dl  Current orders for Inpatient glycemic control:  Novolog 0-15 units tid with meals  Inpatient Diabetes Program Recommendations:    Needs basal insulin.  Please add Semglee 22 units daily.   Thanks,  Adah Perl, RN, BC-ADM Inpatient Diabetes Coordinator Pager [phone]  (8a-5p)

## 2022-05-28 DIAGNOSIS — Z89511 Acquired absence of right leg below knee: Secondary | ICD-10-CM | POA: Diagnosis not present

## 2022-05-28 DIAGNOSIS — D62 Acute posthemorrhagic anemia: Secondary | ICD-10-CM | POA: Diagnosis not present

## 2022-05-28 DIAGNOSIS — E1142 Type 2 diabetes mellitus with diabetic polyneuropathy: Secondary | ICD-10-CM | POA: Diagnosis not present

## 2022-05-28 DIAGNOSIS — K626 Ulcer of anus and rectum: Secondary | ICD-10-CM | POA: Diagnosis not present

## 2022-05-28 LAB — LACTIC ACID, PLASMA: Lactic Acid, Venous: 1.1 mmol/L (ref 0.5–1.9)

## 2022-05-28 LAB — BASIC METABOLIC PANEL
Anion gap: 10 (ref 5–15)
BUN: 24 mg/dL — ABNORMAL HIGH (ref 8–23)
CO2: 26 mmol/L (ref 22–32)
Calcium: 8.1 mg/dL — ABNORMAL LOW (ref 8.9–10.3)
Chloride: 95 mmol/L — ABNORMAL LOW (ref 98–111)
Creatinine, Ser: 2.75 mg/dL — ABNORMAL HIGH (ref 0.44–1.00)
GFR, Estimated: 18 mL/min — ABNORMAL LOW (ref >60)
Glucose, Bld: 219 mg/dL — ABNORMAL HIGH (ref 70–99)
Potassium: 3.6 mmol/L (ref 3.5–5.1)
Sodium: 131 mmol/L — ABNORMAL LOW (ref 135–145)

## 2022-05-28 LAB — GLUCOSE, CAPILLARY
Glucose-Capillary: 223 mg/dL — ABNORMAL HIGH (ref 70–99)
Glucose-Capillary: 233 mg/dL — ABNORMAL HIGH (ref 70–99)
Glucose-Capillary: 234 mg/dL — ABNORMAL HIGH (ref 70–99)
Glucose-Capillary: 246 mg/dL — ABNORMAL HIGH (ref 70–99)
Glucose-Capillary: 248 mg/dL — ABNORMAL HIGH (ref 70–99)
Glucose-Capillary: 319 mg/dL — ABNORMAL HIGH (ref 70–99)

## 2022-05-28 LAB — CBC
HCT: 29.9 % — ABNORMAL LOW (ref 36.0–46.0)
Hemoglobin: 9.1 g/dL — ABNORMAL LOW (ref 12.0–15.0)
MCH: 27.2 pg (ref 26.0–34.0)
MCHC: 30.4 g/dL (ref 30.0–36.0)
MCV: 89.3 fL (ref 80.0–100.0)
Platelets: 188 10*3/uL (ref 150–400)
RBC: 3.35 MIL/uL — ABNORMAL LOW (ref 3.87–5.11)
RDW: 20.7 % — ABNORMAL HIGH (ref 11.5–15.5)
WBC: 9.8 10*3/uL (ref 4.0–10.5)
nRBC: 0 % (ref 0.0–0.2)

## 2022-05-28 LAB — HEPATITIS B SURFACE ANTIBODY, QUANTITATIVE: Hep B S AB Quant (Post): 74.8 m[IU]/mL (ref >9.9)

## 2022-05-28 LAB — MAGNESIUM: Magnesium: 1.7 mg/dL (ref 1.7–2.4)

## 2022-05-28 LAB — PHOSPHORUS: Phosphorus: 3.4 mg/dL (ref 2.5–4.6)

## 2022-05-28 MED ORDER — MIDODRINE HCL 5 MG PO TABS: 5.0000 mg | ORAL_TABLET | Freq: Three times a day (TID) | ORAL | 0 refills | Status: AC

## 2022-05-28 MED ORDER — MIDODRINE HCL 5 MG PO TABS
5.0000 mg | ORAL_TABLET | Freq: Three times a day (TID) | ORAL | Status: DC
  Administered 2022-05-28 – 2022-05-29 (×4): 5 mg via ORAL
  Filled 2022-05-28 (×5): qty 1

## 2022-05-28 MED ORDER — ASPIRIN EC 81 MG PO TBEC: 81.0000 mg | DELAYED_RELEASE_TABLET | Freq: Every day | ORAL | 11 refills | Status: DC

## 2022-05-28 MED ORDER — INSULIN GLARGINE-YFGN 100 UNIT/ML ~~LOC~~ SOLN
30.0000 [IU] | Freq: Every day | SUBCUTANEOUS | Status: DC
  Administered 2022-05-28: 30 [IU] via SUBCUTANEOUS
  Filled 2022-05-28 (×2): qty 0.3

## 2022-05-28 MED ORDER — DARBEPOETIN ALFA 150 MCG/0.3ML IJ SOSY: 150.0000 ug | PREFILLED_SYRINGE | INTRAMUSCULAR | Status: DC

## 2022-05-28 MED ORDER — PANTOPRAZOLE SODIUM 40 MG PO TBEC: 40.0000 mg | DELAYED_RELEASE_TABLET | Freq: Every day | ORAL | 1 refills | Status: DC

## 2022-05-28 MED ORDER — OXYCODONE-ACETAMINOPHEN 5-325 MG PO TABS: 1.0000 | ORAL_TABLET | ORAL | 0 refills | Status: DC | PRN

## 2022-05-28 NOTE — Progress Notes (Signed)
Occupational Therapy Treatment Patient Details Name: Susan [name] MRN: [id number] DOB: 01/05/1951 Today's Date: 05/28/2022   History of present illness 72 y.o. female admitted 12/28 with GIB and hypotension. 12/30 oversewing of rectal ulcer. PMHx: 05/02/22 Rt BKA, T2DM, CHF, HTN, CAD, CKD, MI, BLE venous insufficiency, anxiety, ESRD on HD TTS, obesity, OA, CVA, Urinary incontinence   OT comments  Patient received in supine and agreeable to OT session. Patient stated several times that she was fearful of falling and was reassured of her safety. Patient continues to require mod assist of 2 for transfer to recliner with good understanding of assist level. Patient stated she was worried on her ability to perform transfers once she returns home. Patient was reassured that with therapy at SNF they would train her to be more independent with transfers to ensure safe discharge home. Patient to continue to be followed by acute OT.    Recommendations for follow up therapy are one component of a multi-disciplinary discharge planning process, led by the attending physician.  Recommendations may be updated based on patient status, additional functional criteria and insurance authorization.    Follow Up Recommendations  Skilled nursing-short term rehab (<3 hours/day)     Assistance Recommended at Discharge Frequent or constant Supervision/Assistance  Patient can return home with the following  Two people to help with walking and/or transfers;Two people to help with bathing/dressing/bathroom   Equipment Recommendations  Other (comment) (defer)    Recommendations for Other Services      Precautions / Restrictions Precautions Precautions: Fall Precaution Comments: recent R BKA (05/02/22) Required Braces or Orthoses: Other Brace Other Brace: R BKA limb protector- poor skin integrity and not used today Restrictions Weight Bearing Restrictions: Yes RLE Weight Bearing: Non weight bearing       Mobility  Bed Mobility Overal bed mobility: Needs Assistance Bed Mobility: Supine to Sit, Rolling Rolling: Min assist   Supine to sit: Min assist     General bed mobility comments: use of bed pad to pivot hips to EOB and to scoot forward    Transfers Overall transfer level: Needs assistance Equipment used: None Transfers: Bed to chair/wheelchair/BSC            Lateral/Scoot Transfers: Mod assist, +2 physical assistance, +2 safety/equipment General transfer comment: verbal cues throughout for hand placement and trunk positioning with use of bed pads to aide in scooting     Balance Overall balance assessment: Needs assistance Sitting-balance support: No upper extremity supported, Feet supported Sitting balance-Leahy Scale: Fair                                     ADL either performed or assessed with clinical judgement   ADL Overall ADL's : Needs assistance/impaired     Grooming: Wash/dry hands;Wash/dry face;Oral care;Brushing hair;Sitting;Set up Grooming Details (indicate cue type and reason): perforemd in recliner                               General ADL Comments: grooming performed seated in recliner    Extremity/Trunk Assessment              Vision       Perception     Praxis      Cognition Arousal/Alertness: Awake/alert Behavior During Therapy: Anxious Overall Cognitive Status: Impaired/Different from baseline Area of Impairment: Following commands, Awareness, Problem solving, Safety/judgement  Following Commands: Follows one step commands with increased time, Follows multi-step commands inconsistently Safety/Judgement: Decreased awareness of safety, Decreased awareness of deficits   Problem Solving: Slow processing, Difficulty sequencing, Requires verbal cues General Comments: patient stating she is fearful of falling        Exercises      Shoulder Instructions       General Comments  SpO2 97 on 2 liters    Pertinent Vitals/ Pain       Pain Assessment Pain Assessment: Faces Faces Pain Scale: Hurts a little bit Pain Location: generalized Pain Descriptors / Indicators: Guarding Pain Intervention(s): Limited activity within patient's tolerance, Monitored during session, Repositioned  Home Living                                          Prior Functioning/Environment              Frequency  Min 2X/week        Progress Toward Goals  OT Goals(current goals can now be found in the care plan section)  Progress towards OT goals: Progressing toward goals  Acute Rehab OT Goals Patient Stated Goal: get better OT Goal Formulation: With patient Time For Goal Achievement: 06/09/22 Potential to Achieve Goals: Fair ADL Goals Pt Will Perform Grooming: sitting;with modified independence Pt Will Transfer to Toilet: with min assist;with transfer board;bedside commode Pt Will Perform Toileting - Clothing Manipulation and hygiene: with mod assist;sitting/lateral leans Additional ADL Goal #1: Pt will complete bed mobility with supervision. Additional ADL Goal #2: Pt will participate in seated ADLs x 15 minutes at EOB without UE support.  Plan Discharge plan remains appropriate    Co-evaluation                 AM-PAC OT "6 Clicks" Daily Activity     Outcome Measure   Help from another person eating meals?: None Help from another person taking care of personal grooming?: A Little Help from another person toileting, which includes using toliet, bedpan, or urinal?: Total Help from another person bathing (including washing, rinsing, drying)?: A Lot Help from another person to put on and taking off regular upper body clothing?: A Little Help from another person to put on and taking off regular lower body clothing?: Total 6 Click Score: 14    End of Session Equipment Utilized During Treatment: Oxygen  OT Visit Diagnosis: Muscle weakness  (generalized) (M62.81);Other abnormalities of gait and mobility (R26.89)   Activity Tolerance Patient tolerated treatment well   Patient Left in chair;with call bell/phone within reach;with chair alarm set   Nurse Communication Mobility status;Need for lift equipment        Time: 5176-1607 OT Time Calculation (min): 26 min  Charges: OT General Charges $OT Visit: 1 Visit OT Treatments $Self Care/Home Management : 8-22 mins $Therapeutic Activity: 8-22 mins  Lodema Hong, [place]  Office [place] 05/28/2022, 1:58 PM

## 2022-05-28 NOTE — Progress Notes (Signed)
[facility] Progress Note  Subjective: Last HD on 1/2 with discrepancy in charting - one note says 3.5 kg UF and another flowchart stating 5 mL UF removed.  She states she has just been on dialysis a couple of months.  She states she is on 2 liters oxygen at home and has been on 3 liters here.  States had a recent right BKA.  Looks like a dose of midodrine was held yesterday afternoon.   Review of systems:  She reports a little shortness of breath; denies chest pain  She reports nausea without vomiting   Vitals:   05/27/22 1819 05/27/22 1954 05/28/22 0451 05/28/22 0700  BP: (!) 119/48 130/60 132/64   Pulse: 93 97 88   Resp: '10 19 18   '$ Temp: 98.2 F (36.8 C) (!) 97.4 F (36.3 C) 97.6 F (36.4 C)   TempSrc: Oral Oral Oral   SpO2: 100% 100% 100%   Weight: 105.5 kg   106 kg  Height:        Exam:  General adult female in bed in no acute distress HEENT normocephalic atraumatic extraocular movements intact sclera anicteric Neck supple trachea midline Lungs clear to auscultation bilaterally normal work of breathing at rest on 3 liters oxygen per nasal cannula Heart S1S2 no rub Abdomen soft nontender nondistended Extremities right BKA; edema of right residual limb and 1+ edema of left leg (which is wrapped)  Psych no anxiety or agitation Neuro conversant and follows commands Access: RIJ TDC in place  (and L arm AVF+ bruit resting d/t infection)      Home meds include - albuterol, pacerone, eliquis, coreg 6.25 bid, fidaxomicin, hydralazine 25 qid prn, insulin aspart, synthroid, linagliptin, pregabalin, renvela 2 ac tid, vortioxetine, prns/ vits/ supps        OP HD: SW TTS  4h  400/800   98.5kg  3K/2.5Ca bath  TDC/ AVF resting due to recent infection - mircera 150 mcg q2, last 12/26 - last Hb 8.8 on 12/21, due 06/03/22 - last HD 12/28, post wt 99.3 - no vdra     Assessment/ Plan: Lower GIB - went to OR for oversewing of a bleeding rectal ulcer  12/30 Shock -  resolved, off pressors but remains on midodrine - lowering dose as below ESRD - on HD per TTS schedule H/o HTN - we were holding home BP meds due to shock; note he has now been started on midodrine as well.  We will wean midodrine to 5 mg TID Volume - edema in lower extremities per charting; not eating well per dtr for a while. UF w/ HD as tolerated and lower dry wt as able Anemia esrd as well as anemia secondary to acute blood loss with GI bleed - Recent mircera at outpatient HD unit.  Would be due again on 1/9 if still here (I have ordered). Follow and transfuse prn.   MBD ckd - CCa in range,  phos at goal  Uncont DM - per pmd  Disposition - per primary team    Recent Labs  Lab 05/22/22 2220 05/22/22 2253 05/27/22 0321 05/28/22 0549  HGB 9.0*   < > 9.6* 9.1*  ALBUMIN 2.1*  --   --   --   CALCIUM 8.6*   < > 8.4* 8.1*  PHOS  --    < > 5.1* 3.4  CREATININE 2.80*   < > 4.19* 2.75*  K 3.6   < > 4.4 3.6   < > = values in  this interval not displayed.   No results for input(s): "IRON", "TIBC", "FERRITIN" in the last 168 hours. Inpatient medications:  sodium chloride   Intravenous Once   sodium chloride   Intravenous Once   amiodarone  200 mg Oral Daily   Chlorhexidine Gluconate Cloth  6 each Topical Q0600   docusate sodium  100 mg Oral BID   insulin aspart  0-15 Units Subcutaneous TID WC   insulin glargine-yfgn  22 Units Subcutaneous Daily   leptospermum manuka honey  1 Application Topical Daily   levothyroxine  50 mcg Oral QAC breakfast   linagliptin  5 mg Oral Daily   midodrine  10 mg Oral TID with meals   pantoprazole (PROTONIX) IV  40 mg Intravenous Q12H   polyethylene glycol  17 g Oral Daily   pregabalin  75 mg Oral Daily     acetaminophen, HYDROmorphone (DILAUDID) injection, ondansetron (ZOFRAN) IV, mouth rinse   Claudia Desanctis, MD 7:27 AM 05/28/2022

## 2022-05-28 NOTE — Progress Notes (Signed)
Dr. Doristine Bosworth made aware of patient having low BP, 96/56 this morning. Per Dr. Doristine Bosworth, ok to give amiodarone and midodrine this morning

## 2022-05-28 NOTE — Progress Notes (Signed)
Contacted by CSW with request that pt's out-pt HD schedule be changed to MWF 2nd shift due to snf placement at [facility]. Contacted [place] [facility] and spoke to [place], [place] manager. Levada Dy states pt can have a MWF 11:15 am chair time and can start on Friday. Update provided to CSW, nephrologist, and renal NP.   Melven Sartorius Renal Navigator [phone]

## 2022-05-28 NOTE — NC FL2 (Signed)
[place] LEVEL OF CARE FORM     IDENTIFICATION  Patient Name: Susan [name] Birthdate: [birth year] Sex: female Admission Date (Current Location): 05/22/2022  [facility] and [place] Number:  Herbalist and Address:  [facility], [place] [address], [place], [place] [address]      Provider Number: [id number]  Attending Physician Name and Address:  Darliss Cheney, MD  Relative Name and Phone Number:       Current Level of Care: Hospital Recommended Level of Care: [place] Prior Approval Number:    Date Approved/Denied:   PASRR Number: [id number]  Discharge Plan:      Current Diagnoses: Patient Active Problem List   Diagnosis Date Noted   Rectal ulcer 05/27/2022   ESRD on dialysis [facility]) 05/27/2022   GI bleed 05/23/2022   Difficult intravenous access 05/23/2022   Gangrene of right foot ([place]) 05/02/2022   S/P BKA (below knee amputation) unilateral, right ([place]) 05/02/2022   Unspecified protein-calorie malnutrition ([place]) 04/15/2022   Secondary hyperparathyroidism of renal origin ([place]) 04/14/2022   Coagulation defect, unspecified ([place]) 04/09/2022   Acquired absence of other left toe(s) ([place]) 04/07/2022   Allergy, unspecified, initial encounter 04/07/2022   Dependence on renal dialysis ([place]) 04/07/2022   Gout due to renal impairment, unspecified site 04/07/2022   Hypertensive heart and chronic kidney disease with heart failure and with stage 5 chronic kidney disease, or end stage renal disease ([place]) 04/07/2022   Personal history of transient ischemic attack (TIA), and cerebral infarction without residual deficits 04/07/2022   Renal osteodystrophy 04/07/2022   Venous stasis ulcer of right calf ([place]) 03/31/2022   Fistula, colovaginal 03/26/2022   Diarrhea 03/26/2022   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   Sepsis without acute organ dysfunction ([place])    Bacteremia    Acute pancreatitis 02/01/2022   Abdominal pain  02/01/2022   SIRS (systemic inflammatory response syndrome) ([place]) 02/01/2022   Transaminitis 02/01/2022   History of anemia due to chronic kidney disease 02/01/2022   Paroxysmal atrial fibrillation ([place]) 02/01/2022   DKA (diabetic ketoacidosis) ([place]) 01/14/2022   NSTEMI (non-ST elevated myocardial infarction) ([place]) 03/05/2021   Acute renal failure superimposed on stage 4 chronic kidney disease ([place]) 08/22/2020   Hypoalbuminemia 05/25/2020   GERD (gastroesophageal reflux disease) 05/25/2020   Pressure injury of skin 05/17/2020   Type 2 diabetes mellitus with diabetic polyneuropathy, with long-term current use of insulin ([place]) 03/07/2020   Obesity, Class III, BMI 40-49.9 (morbid obesity) ([place]) 03/07/2020   Common bile duct (CBD) obstruction 05/28/2019   Benign neoplasm of ascending colon    Benign neoplasm of transverse colon    Benign neoplasm of descending colon    Benign neoplasm of sigmoid colon    Gastric polyps    Hyperkalemia 03/11/2019   Prolonged QT interval 03/11/2019   Acute blood loss anemia 03/11/2019   Onychomycosis 06/21/2018   Osteomyelitis of second toe of right foot ([employer] [facility])    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 (HCC)    PVD (peripheral vascular disease) ([place]) 10/26/2017   E-coli UTI 07/27/2017   Hypothyroidism 07/27/2017   AKI (acute kidney injury) ([place])    PAH (pulmonary artery hypertension) (HCC)    Impaired ambulation 07/19/2017   Nausea & vomiting 07/15/2017   Leg cramps 02/27/2017   Peripheral edema 01/12/2017   Diabetic neuropathy ([place]) 11/12/2016   CKD (chronic kidney disease), stage IV ([place]) 10/24/2015   Anemia 10/03/2015   Generalized anxiety disorder 10/03/2015  Insomnia 10/03/2015   Hyperglycemia due to diabetes mellitus ([place]) 06/07/2015   Chronic diastolic CHF (congestive heart failure) ([place]) 06/07/2015   Non compliance with medical treatment 04/17/2014   Rotator cuff tear 03/14/2014   Class 3 obesity (HCC) 09/23/2013    Chronic HFrEF (heart failure with reduced ejection fraction) (HCC) 06/03/2013   Hypotension 12/25/2012   Urinary incontinence    MDD (major depressive disorder) 11/12/2010   RBBB (right bundle branch block)    Coronary artery disease    Hyperlipemia 01/22/2009   Essential hypertension 01/22/2009    Orientation RESPIRATION BLADDER Height & Weight     Self, Time, Situation, Place  Normal Incontinent Weight: 233 lb 11 oz (106 kg) Height:  '5\' 5"'$  (165.1 cm)  BEHAVIORAL SYMPTOMS/MOOD NEUROLOGICAL BOWEL NUTRITION STATUS      Continent Diet (See discharge summary)  AMBULATORY STATUS COMMUNICATION OF NEEDS Skin   Extensive Assist Verbally Surgical wounds, Normal                       Personal Care Assistance Level of Assistance  Bathing, Feeding, Dressing Bathing Assistance: Limited assistance   Dressing Assistance: Limited assistance     Functional Limitations Info  Hearing, Sight, Speech Sight Info: Adequate Hearing Info: Adequate Speech Info: Adequate    SPECIAL CARE FACTORS FREQUENCY  PT (By licensed PT)     PT Frequency: 5x weekly OT Frequency: 5x weekly            Contractures Contractures Info: Not present    Additional Factors Info  Code Status, Allergies Code Status Info: DNR Allergies Info: Keflex (cephalexin) ;Codeine           Current Medications (05/28/2022):  This is the current hospital active medication list Current Facility-Administered Medications  Medication Dose Route Frequency Provider Last Rate Last Admin   0.9 %  sodium chloride infusion (Manually program via Guardrails IV Fluids)   Intravenous Once Kinsinger, Arta Bruce, MD       0.9 %  sodium chloride infusion (Manually program via Guardrails IV Fluids)   Intravenous Once Kinsinger, Arta Bruce, MD       acetaminophen (TYLENOL) tablet 650 mg  650 mg Oral Q6H PRN Candee Furbish, MD   650 mg at 05/26/22 0820   amiodarone (PACERONE) tablet 200 mg  200 mg Oral Daily Darliss Cheney, MD   200  mg at 05/28/22 8502   Chlorhexidine Gluconate Cloth 2 % PADS 6 each  6 each Topical Q0600 Kinsinger, Arta Bruce, MD   6 each at 05/28/22 0514   [START ON 06/03/2022] Darbepoetin Alfa (ARANESP) injection 150 mcg  150 mcg Subcutaneous Q Tue-1800 Harrie Jeans C, MD       docusate sodium (COLACE) capsule 100 mg  100 mg Oral BID Kinsinger, Arta Bruce, MD   100 mg at 05/27/22 0924   HYDROmorphone (DILAUDID) injection 1 mg  1 mg Intravenous Q3H PRN Candee Furbish, MD   1 mg at 05/28/22 1227   insulin aspart (novoLOG) injection 0-15 Units  0-15 Units Subcutaneous TID WC Jennelle Human B, NP   5 Units at 05/28/22 1227   insulin glargine-yfgn (SEMGLEE) injection 30 Units  30 Units Subcutaneous Daily Darliss Cheney, MD   30 Units at 05/28/22 0931   leptospermum manuka honey (MEDIHONEY) paste 1 Application  1 Application Topical Daily Sharyn Creamer, MD   1 Application at 77/41/28 0908   levothyroxine (SYNTHROID) tablet 50 mcg  50 mcg Oral QAC breakfast Pahwani,  Einar Grad, MD   50 mcg at 05/28/22 0509   linagliptin (TRADJENTA) tablet 5 mg  5 mg Oral Daily Darliss Cheney, MD   5 mg at 05/28/22 0903   midodrine (PROAMATINE) tablet 5 mg  5 mg Oral TID with meals Claudia Desanctis, MD   5 mg at 05/28/22 1227   ondansetron (ZOFRAN) injection 4 mg  4 mg Intravenous Q6H PRN Candee Furbish, MD   4 mg at 05/27/22 2633   Oral care mouth rinse  15 mL Mouth Rinse PRN Kinsinger, Arta Bruce, MD       pantoprazole (PROTONIX) injection 40 mg  40 mg Intravenous Q12H Kinsinger, Arta Bruce, MD   40 mg at 05/28/22 0858   polyethylene glycol (MIRALAX / GLYCOLAX) packet 17 g  17 g Oral Daily Kinsinger, Arta Bruce, MD   17 g at 05/26/22 1051   pregabalin (LYRICA) capsule 75 mg  75 mg Oral Daily Darliss Cheney, MD   75 mg at 05/28/22 3545     Discharge Medications: Please see discharge summary for a list of discharge medications.  Relevant Imaging Results:  Relevant Lab Results:   Additional Information SSN: [id number]  Archie Endo, LCSW

## 2022-05-28 NOTE — Plan of Care (Signed)
Note that patient's next HD will be outpatient on Friday at her new clinic, [facility] (MWF schedule)   Claudia Desanctis, MD 7:07 PM 05/28/2022

## 2022-05-28 NOTE — Discharge Summary (Signed)
Physician Discharge Summary  Susan [name] DGL:[id number] DOB: [birth year] DOA: 05/22/2022  PCP: [name], MD  Admit date: 05/22/2022 Discharge date: 05/28/2022 30 Day Unplanned Readmission Risk Score    Flowsheet Row ED to Hosp-Admission (Current) from 05/22/2022 in [facility]  30 Day Unplanned Readmission Risk Score (%) 45.2 Filed at 05/28/2022 0801       This score is the patient's risk of an unplanned readmission within 30 days of being discharged (0 -100%). The score is based on dignosis, age, lab data, medications, orders, and past utilization.   Low:  0-14.9   Medium: 15-21.9   High: 22-29.9   Extreme: 30 and above          Admitted From: Home Disposition: SNF  Recommendations for Outpatient Follow-up:  Follow up with PCP in 1-2 weeks Please obtain BMP/CBC in one week Please follow up with your PCP on the following pending results: Unresulted Labs (From admission, onward)     Start     Ordered   05/27/22 0500  CBC  Daily,   R     Question:  Specimen collection method  Answer:  Lab=Lab collect   05/26/22 1249   05/26/22 1519  Hepatitis B surface antibody,quantitative  (New Admission Hemo Labs (Hepatitis B))  Once,   R       Question:  Specimen collection method  Answer:  Lab=Lab collect   05/26/22 1520   05/26/22 0500  Magnesium  Daily,   R     Question:  Specimen collection method  Answer:  Unit=Unit collect   05/25/22 1026   05/26/22 0500  Phosphorus  Daily,   R     Question:  Specimen collection method  Answer:  Unit=Unit collect   05/25/22 [place]: None Equipment/Devices: None  Discharge Condition: Stable CODE STATUS: DNR Diet recommendation: Renal  Subjective: Seen and examined.  She has no new complaint.  Brief/Interim Summary: 72 year old female with end-stage renal disease on hemodialysis, coronary artery disease, paroxysmal A-fib on Eliquis, diabetes and hypertension was brought in the emergency  department with complaint of abdominal pain and diarrhea for last few days.  Patient stated, that few days ago she started with dark bloody bowel movements initially it was 2-3 times a day but yesterday it happened 6-7 times and she felt weak so was brought to the emergency department.  Initially she was found to be hypotensive, requiring IV fluid with improvement in blood pressure, in the emergency department she had multiple large bowel movement containing clots.  GI was consulted they recommend CT angiogram abdomen and pelvis, initial CT angio was negative.  Patient is compliant with her Eliquis, she received Kcentra for anticoagulation reversal, as she was actively bleeding, she was admitted in ICU under PCCM care, subsequently transferred under [facility] on 05/27/22.  Details as below.   Acute blood loss anemia secondary to acute lower GI bleed/rectal ulcer: Presented with active lower GI bleed, CTA was negative for active bleeding, GI consulted, they tried sigmoidoscopy x 2 on 11/29 and 11/30 which were failed and subsequently general surgery was consulted and patient underwent exam under anesthesia and oversewing of rectal ulcer on 05/24/2022 with Dr. Kieth Brightly.  Patient has received total of 4 units of PRBC transfusion during this hospitalization.  Patient's hemoglobin has remained stable since 05/26/2022.  GI as well as general surgery has signed off.  Patient is now stable and  is being discharged to SNF today.   Chronic diarrhea: Per PCCM, her history consistently has been variable, C. difficile ordered on admit but never sent.  Per them, she does not meet criteria for C. difficile anymore.  She is afebrile with no leukocytosis.  Her diarrhea is improving.   Chronic hypoxic respiratory failure secondary to chronic systolic congestive heart failure: She tells me that she uses 2 to 3 L of oxygen at home, she is at baseline.  She appears to be euvolemic.  She is on dialysis.   ESRD on HD: Nephrology on board.   She is on TTS schedule.   Paroxysmal atrial fibrillation: Patient appears to be taking Coreg and amiodarone at home, both of them were hold.  Patient's blood pressure was on low normal side so we only resumed amiodarone.  Patient's blood pressure remains on the low normal side but patient is totally asymptomatic and lactic acid is normal so at this point in time, I am discharging her on amiodarone only but discontinuing Coreg.  We are also discontinuing Eliquis for recent GI bleed however this can be considered to rechallenge in next couple of weeks.  Will defer to PCP or GI.  Patient's aspirin was also on hold, I have advised the patient to resume aspirin in 1 week.  Acquired hypothyroidism: Resume Synthroid.   Uncontrolled type 2 diabetes mellitus with hyperglycemia: Patient uses insulin pump at home which she will resume.  Here she was managed with long-acting insulin and SSI.   History of essential hypertension: Still on the low normal side, for this, she was started on midodrine 10 mg 3 times daily which was reduced to 5 mg 3 times daily yesterday by nephrology.  This should be tapered gradually over the course of next few days to few weeks based on patient's blood pressure.  If blood pressure goes too high, Coreg can be resumed as well.   Peripheral neuropathy: Resume home medications.   Right leg venous ulcer bleeding status post right BKA:   History of recurrent C. difficile colitis: He saw ID on 03/26/2022 and was prescribed fidaxomicin which she completed therapy on 04/06/2022.  She still have diarrhea but she is improving.  She has no signs of infection.   Disposition: Seen by PT OT they recommend SNF.  Patient is being discharged today.  Discharge plan was discussed with patient and/or family member and they verbalized understanding and agreed with it.  Discharge Diagnoses:  Active Problems:   Hypotension   Chronic HFrEF (heart failure with reduced ejection fraction) (HCC)    Hypothyroidism   Acute blood loss anemia   Type 2 diabetes mellitus with diabetic polyneuropathy, with long-term current use of insulin (HCC)   Paroxysmal atrial fibrillation (HCC)   Diarrhea   S/P BKA (below knee amputation) unilateral, right (HCC)   GI bleed   Difficult intravenous access   Rectal ulcer   ESRD on dialysis [facility])    Discharge Instructions   Allergies as of 05/28/2022       Reactions   Keflex [cephalexin] Diarrhea   Codeine Nausea And Vomiting, Other (See Comments)        Medication List     STOP taking these medications    apixaban 5 MG Tabs tablet Commonly known as: ELIQUIS   carvedilol 6.25 MG tablet Commonly known as: COREG       TAKE these medications    albuterol 108 (90 Base) MCG/ACT inhaler Commonly known as: VENTOLIN HFA Inhale 1-2 puffs  into the lungs every 6 (six) hours as needed for wheezing or shortness of breath.   amiodarone 200 MG tablet Commonly known as: PACERONE Take 1 tablet (200 mg total) by mouth daily.   Anusol-HC 2.5 % rectal cream Generic drug: hydrocortisone Place 1 Application rectally 2 (two) times daily.   aspirin EC 81 MG tablet Take 1 tablet (81 mg total) by mouth daily with breakfast. Start taking on: June 04, 2022 What changed: These instructions start on June 04, 2022. If you are unsure what to do until then, ask your doctor or other care provider.   DIALYVITE TABLET Tabs Take 1 tablet by mouth daily.   diphenoxylate-atropine 2.5-0.025 MG tablet Commonly known as: Lomotil Take 1 tablet by mouth 4 (four) times daily as needed for diarrhea or loose stools.   feeding supplement (GLUCERNA 1.2 CAL) Liqd Take 237 mLs by mouth daily.   feeding supplement (PRO-STAT SUGAR FREE 64) Liqd Take 30 mLs by mouth in the morning and at bedtime.   ferrous sulfate 325 (65 FE) MG tablet Take 325 mg by mouth 2 (two) times daily.   fidaxomicin 200 MG Tabs tablet Commonly known as: DIFICID Take 1 tablet (200 mg  total) by mouth 2 (two) times daily.   folic acid-vitamin b complex-vitamin c-selenium-zinc 3 MG Tabs tablet Take 1 tablet by mouth daily.   FreeStyle Libre 2 Sensor Misc   hydrALAZINE 25 MG tablet Commonly known as: APRESOLINE Take 1 tablet (25 mg total) by mouth every 6 (six) hours as needed (SBP>150 or DBP>100). What changed:  when to take this additional instructions   levothyroxine 50 MCG tablet Commonly known as: SYNTHROID Take 1 tablet (50 mcg total) by mouth daily before breakfast.   linagliptin 5 MG Tabs tablet Commonly known as: TRADJENTA Take 1 tablet (5 mg total) by mouth daily.   magnesium oxide 400 (240 Mg) MG tablet Commonly known as: MAG-OX Take 1 tablet (400 mg total) by mouth daily.   midodrine 5 MG tablet Commonly known as: PROAMATINE Take 1 tablet (5 mg total) by mouth 3 (three) times daily with meals.   NovoLOG FlexPen 100 UNIT/ML FlexPen Generic drug: insulin aspart Inject 0-15 Units into the skin See admin instructions. CBG <70 = Notify NP/MD 71-149 = 0 units 150-199 = 4 units 200-249 = 6 units 250- 299 = 10 units 300- 349 = 12 units 350- 399 = 15 units, > 400 call MD ( prime pen with 2 units prior  to set dose)   ondansetron 4 MG tablet Commonly known as: ZOFRAN Take 4 mg by mouth every 8 (eight) hours as needed for nausea.   OneTouch Delica Plus [id number] Misc Use as directed to check blood sugars as need up to 4 times daily   OneTouch Verio Flex System w/Device Kit Use up to four times daily as directed.   oxyCODONE-acetaminophen 5-325 MG tablet Commonly known as: PERCOCET/ROXICET Take 1 tablet by mouth every 4 (four) hours as needed.   OXYGEN Inhale 2 L/min into the lungs at bedtime.   pantoprazole 40 MG tablet Commonly known as: Protonix Take 1 tablet (40 mg total) by mouth daily.   Pentips 32G X 4 MM Misc Generic drug: Insulin Pen Needle Use as directed   polyethylene glycol 17 g packet Commonly known as: MIRALAX /  GLYCOLAX Take 17 g by mouth daily.   pregabalin 75 MG capsule Commonly known as: LYRICA Take 1 capsule (75 mg total) by mouth daily.   saccharomyces boulardii 250 MG  capsule Commonly known as: FLORASTOR Take 500 mg by mouth 2 (two) times daily. X14 days   senna-docusate 8.6-50 MG tablet Commonly known as: Senokot-S Take 2 tablets by mouth at bedtime.   sevelamer carbonate 800 MG tablet Commonly known as: RENVELA Take 1,600 mg by mouth 3 (three) times daily with meals.   vortioxetine HBr 10 MG Tabs tablet Commonly known as: Trintellix Take 2 tablets (20 mg total) by mouth daily.        Follow-up Information     [name], MD Follow up in 1 week(s).   Specialty: Family Medicine Contact information: [address] [place] [address] [phone]                Allergies  Allergen Reactions   Keflex [Cephalexin] Diarrhea   Codeine Nausea And Vomiting and Other (See Comments)    Consultations: GI, GI, general surgery, PCCM   Procedures/Studies: DG CHEST PORT 1 VIEW  Result Date: 05/26/2022 CLINICAL DATA:  Shortness of breath EXAM: PORTABLE CHEST 1 VIEW COMPARISON:  May 23, 2022 FINDINGS: The left and right central lines terminate in the SVC. No pneumothorax. No pulmonary nodules, infiltrates, or overt edema. The cardiomediastinal silhouette is stable. No other acute abnormalities are identified. IMPRESSION: 1. Support apparatus as above. 2. No acute abnormalities. Electronically Signed   By: Dorise Bullion III M.D.   On: 05/26/2022 11:05   DG Chest Port 1 View  Result Date: 05/23/2022 CLINICAL DATA:  Caliber central line placement EXAM: PORTABLE CHEST 1 VIEW COMPARISON:  04/02/2022 FINDINGS: Interval placement of a LEFT central venous line with tip in the distal SVC at the cavoatrial junction. No pneumothorax. Large bore RIGHT central venous catheter unchanged. Low lung volumes. IMPRESSION: Central venous line with tip at the cavoatrial  junction. No pneumothorax Electronically Signed   By: Suzy Bouchard M.D.   On: 05/23/2022 15:01   CT Angio Abd/Pel W and/or Wo Contrast  Result Date: 05/23/2022 CLINICAL DATA:  Ongoing lower GI bleed. EXAM: CTA ABDOMEN AND PELVIS WITHOUT AND WITH CONTRAST TECHNIQUE: Multidetector CT imaging of the abdomen and pelvis was performed using the standard protocol during bolus administration of intravenous contrast. Multiplanar reconstructed images and MIPs were obtained and reviewed to evaluate the vascular anatomy. RADIATION DOSE REDUCTION: This exam was performed according to the departmental dose-optimization program which includes automated exposure control, adjustment of the mA and/or kV according to patient size and/or use of iterative reconstruction technique. CONTRAST:  154m OMNIPAQUE IOHEXOL 350 MG/ML SOLN COMPARISON:  CTA abdomen and pelvis earlier today at 12:53 a.m., CT abdomen pelvis without contrast 03/27/2022 and 08/22/2020. FINDINGS: VASCULAR Aorta: Moderate calcific plaque. No aneurysm, dissection or stenosis. Celiac: Moderate calcification noted without flow-limiting celiac stenosis, with circumferential calcifications in the branch arteries and extensive small branch arterial calcifications within the spleen and central liver. SMA: There is moderate calcification proximal to the inflection point without stenosis, circumferential calcification distal to the inflection point. No major-vessel stenosis. Renals: There are calcifications moderately in both proximal renal arteries but no hemodynamically significant (greater than 50%) stenosis. Calcifications continue into the hila and parenchyma along the branch arteries. IMA: Circumferentially calcified with severe origin stenosis. Flow is otherwise seen as previously. Inflow: No interval change from earlier today. Calcifications are greatest in the common iliac arteries and internal iliac arteries, but no stenosis greater than 50% is seen. There is  only mild calcification in the external iliac arteries no stenosis. Proximal Outflow: The proximal outflow vessels demonstrate  bilateral calcifications without flow-limiting stenoses. Veins: Patent. Review of the MIP images confirms the above findings. NON-VASCULAR Lower chest: No acute abnormality. Hepatobiliary: Scattered calcified granulomas. Capsular nodularity suggesting at least early cirrhosis with mild hepatic steatosis. No mass enhancement. There is cholelithiasis without bile duct dilatation or cholecystitis. Pancreas: Diffusely atrophic.  No other focal abnormality. Spleen: Mildly enlarged with scattered calcified granulomas, 15 cm AP. No focal abnormality. Adrenals/Urinary Tract: No new abnormality. Extensive renovascular calcifications. Stomach/Bowel: Wall thickening of the distal sigmoid colon and rectum is again noted with adjacent stranding. In contrast to the earlier study today, there is now an irregular contrast blush into the lumen from the left dorsal aspect of the distal rectal wall on arterial phase series 8 axial images 192-201, with further diffusion of leaked contrast into the rectal content on venous phase series 10 axial images 133-191. The contrast blush in the arterial phase is somewhat serpiginous with a least 1 and possibly 2 sites of active leakage. Upstream diverticulosis and constipation are redemonstrated. Lymphatic: No adenopathy. Reproductive: Status post hysterectomy. No adnexal masses. Other: Mild body wall edema. Minimal deep pelvic ascites is similar. Musculoskeletal: Osteopenia and degenerative change. IMPRESSION: 1. There is an irregular, somewhat serpiginous contrast blush into the distal rectum from the left dorsal wall on the arterial phase with further diffusion of the leaked contrast into the rectal content on the venous phase. Findings are consistent with active GI bleeding. 2. Extensive vascular calcifications, especially of small intraorgan branch arteries. 3. Wall  thickening of the distal sigmoid colon and rectum with adjacent stranding. Findings consistent with proctocolitis. 4. Upstream diverticulosis and constipation. 5. Cirrhotic liver morphology with mild splenomegaly. The main portal vein is normal caliber. 6. Cholelithiasis. 7. Mild body wall edema and minimal pelvic ascites. 8. Osteopenia and degenerative change. 9. Aortic atherosclerosis. 10. Additional redemonstrated findings described above as well as earlier today. 11. These results will be called to the ordering clinician or representative by the radiologist assistant, and communication documented in the PACS or [facility] dashboard. Aortic Atherosclerosis (ICD10-I70.0). Electronically Signed   By: Telford Nab M.D.   On: 05/23/2022 06:28   CT Angio Abd/Pel W and/or Wo Contrast  Result Date: 05/23/2022 CLINICAL DATA:  Lower GI bleed.  Bloody diarrhea x4 days. EXAM: CTA ABDOMEN AND PELVIS WITHOUT AND WITH CONTRAST TECHNIQUE: Multidetector CT imaging of the abdomen and pelvis was performed using the standard protocol during bolus administration of intravenous contrast. Multiplanar reconstructed images and MIPs were obtained and reviewed to evaluate the vascular anatomy. RADIATION DOSE REDUCTION: This exam was performed according to the departmental dose-optimization program which includes automated exposure control, adjustment of the mA and/or kV according to patient size and/or use of iterative reconstruction technique. CONTRAST:  177m OMNIPAQUE IOHEXOL 350 MG/ML SOLN COMPARISON:  CT of abdomen and pelvis studies, both without contrast, dated 03/27/2022 and 08/22/2020. There is also an MRCP from 02/01/2022. FINDINGS: VASCULAR Aorta: There are moderate patchy calcific plaques without aneurysm, flow-limiting stenosis or dissection. Celiac: There are patchy calcific plaques of the celiac artery and its major branch arteries. Ostial calcific plaques cause 40% origin stenosis but there is no other focal stenosis,  no dissection or aneurysm. There are extensive calcifications in the hepatic and splenic parenchymal small arteries. SMA: There are moderate calcific plaques, including circumferential calcifications distal to the vessel inflection point, but no flow-limiting major-vessel stenosis. Renals: There are calcific plaques, which continue to the renal hila but no flow-limiting or greater than 50% major vessel stenosis is seen.  Both renal arteries are single. IMA: There are circumferential vessel calcifications. There is high-grade vessel origin stenosis but no other visible focal stenosis. Inflow: There are moderate calcifications in the common iliac arteries but no hemodynamically significant or greater than 50% stenosis is seen. There are circumferential calcifications in the internal iliac arteries but no flow-limiting stenosis. There is mild scattered calcification in the external iliac arteries without significant stenosis. Proximal Outflow: There are calcifications in the common femoral and proximal superficial femoral arteries and of the visualized deep femoral arteries, without flow-limiting stenosis. Veins: Patent.  The main portal vein is normal in caliber. Review of the MIP images confirms the above findings. NON-VASCULAR Lower chest: There is mild cardiomegaly. There is calcification in the right coronary artery. The intraventricular blood pool is low in density on the noncontrast scan consistent with anemia. Lung bases are clear. Right pleural effusion noted previously has cleared. Hepatobiliary: The liver is 18 cm length mildly steatotic with scattered calcified granulomas. There is no mass enhancement. Some images suggest capsular lobulation which could be due to early cirrhosis. There are stones in the gallbladder but no wall thickening or biliary prominence. Pancreas: Moderately atrophic without inflammatory changes, mass enhancement or ductal dilatation. Spleen: Mildly enlarged, with multiple calcified  granulomas, unchanged in size at 15 cm AP. No mass is seen. Adrenals/Urinary Tract: There is no adrenal mass. There is mild bilateral renal volume loss likely vascular etiology. There is no mass enhancement. There are small right parapelvic cysts which are unchanged with no follow-up required. Extensive renovascular calcifications extend to the hila. There is no urinary stone or obstruction. There has no bladder thickening. There previously was air in the bladder which is not seen today. Stomach/Bowel: There is increased antral gastric fold thickening without inflammatory changes or penetrating ulcer. The unopacified small bowel is unremarkable and there is a normal appendix. There is moderate stool retention in the ascending and transverse colon. There are colonic diverticula most advanced in the sigmoid segment. There is wall thickening and stranding along the distal sigmoid colon and rectum consistent with proctocolitis, but no arterial or venous contrast leakage is seen in the lumen. Lymphatic: No adenopathy is seen. Reproductive: Uterus and bilateral adnexa are unremarkable. Other: There is mild body wall anasarca but less pronounced than on 2022/04/08. There is minimal ascites in the posterior deep pelvis but the abdominal ascites noted previously has cleared. There is no incarcerated hernia. No bowel pneumatosis is seen and no free air or free hemorrhage. Musculoskeletal: Advanced marginal osteophytosis thoracic and lumbar spine. 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 Mild osteopenia. IMPRESSION: 1. No evidence of arterial or venous contrast leakage into the bowel lumen. 2. Extensive vascular calcifications, particularly the small intraorgan parenchymal branch arteries including renovascular. 3. 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 4. Major branch arteries otherwise without flow-limiting (or greater than 50%) stenoses. 5. Cardiomegaly  with calcific CAD. 6. Anemia. 7. Proctocolitis involving the distal sigmoid colon and rectum, likely infectious or inflammatory. No pneumatosis or portal venous gas. Ischemic etiology not strictly excluded. 8. Diverticulosis without evidence of diverticulitis.  Constipation. 9. Mild hepatic steatosis with possible early cirrhosis. Mild splenomegaly. 10. Cholelithiasis. 11. Mild body wall anasarca but less pronounced than on 2022-04-08. 12. Minimal ascites in the posterior deep pelvis. 13. Osteopenia and degenerative change. 14. Increased antral gastric fold thickening which could be due to nondistention or gastritis. 15. Aortic atherosclerosis. Aortic Atherosclerosis (ICD10-I70.0). Electronically Signed  By: Telford Nab M.D.   On: 05/23/2022 01:47     Discharge Exam: Vitals:   05/28/22 0451 05/28/22 0800  BP: 132/64 (!) 96/56  Pulse: 88 94  Resp: 18 18  Temp: 97.6 F (36.4 C) 97.6 F (36.4 C)  SpO2: 100% 99%   Vitals:   05/27/22 1954 05/28/22 0451 05/28/22 0700 05/28/22 0800  BP: 130/60 132/64  (!) 96/56  Pulse: 97 88  94  Resp: _0 Temp: (!) 97.4 F (36.3 C) 97.6 F (36.4 C)  97.6 F (36.4 C)  TempSrc: Oral Oral  Oral  SpO2: 100% 100%  99%  Weight:   106 kg   Height:        General: Pt is alert, awake, not in acute distress Cardiovascular: RRR, S1/S2 +, no rubs, no gallops Respiratory: CTA bilaterally, no wheezing, no rhonchi Abdominal: Soft, NT, ND, bowel sounds + Extremities: no edema, no cyanosis    The results of significant diagnostics from this hospitalization (including imaging, microbiology, ancillary and laboratory) are listed below for reference.     Microbiology: Recent Results (from the past 240 hour(s))  MRSA Next Gen by PCR, Nasal     Status: None   Collection Time: 05/23/22  4:12 PM   Specimen: Nasal Mucosa; Nasal Swab  Result Value Ref Range Status   MRSA by PCR Next Gen NOT DETECTED NOT DETECTED Final    Comment: (NOTE) The GeneXpert MRSA  Assay (FDA approved for NASAL specimens only), is one component of a comprehensive MRSA colonization surveillance program. It is not intended to diagnose MRSA infection nor to guide or monitor treatment for MRSA infections. Test performance is not FDA approved in patients less than 77 years old. Performed at [facility], [place] [address], [place], [place] [address]   Gastrointestinal Panel by PCR , Stool     Status: None   Collection Time: 05/24/22 11:37 AM   Specimen: Stool  Result Value Ref Range Status   Campylobacter species NOT DETECTED NOT DETECTED Final   Plesimonas shigelloides NOT DETECTED NOT DETECTED Final   Salmonella species NOT DETECTED NOT DETECTED Final   Yersinia enterocolitica NOT DETECTED NOT DETECTED Final   Vibrio species NOT DETECTED NOT DETECTED Final   Vibrio cholerae NOT DETECTED NOT DETECTED Final   Enteroaggregative E coli (EAEC) NOT DETECTED NOT DETECTED Final   Enteropathogenic E coli (EPEC) NOT DETECTED NOT DETECTED Final   Enterotoxigenic E coli (ETEC) NOT DETECTED NOT DETECTED Final   Shiga like toxin producing E coli (STEC) NOT DETECTED NOT DETECTED Final   Shigella/Enteroinvasive E coli (EIEC) NOT DETECTED NOT DETECTED Final   Cryptosporidium NOT DETECTED NOT DETECTED Final   Cyclospora cayetanensis NOT DETECTED NOT DETECTED Final   Entamoeba histolytica NOT DETECTED NOT DETECTED Final   Giardia lamblia NOT DETECTED NOT DETECTED Final   Adenovirus F40/41 NOT DETECTED NOT DETECTED Final   Astrovirus NOT DETECTED NOT DETECTED Final   Norovirus GI/GII NOT DETECTED NOT DETECTED Final   Rotavirus A NOT DETECTED NOT DETECTED Final   Sapovirus (I, II, IV, and V) NOT DETECTED NOT DETECTED Final    Comment: Performed at [facility], [address] [place], [place] [address]     Labs: BNP (last 3 results) Recent Labs    01/29/22 1147 02/01/22 0615 02/25/22 1051  BNP 891.1* 451.5* 9,758.8*   Basic Metabolic Panel: Recent Labs  Lab  05/24/22 0409 05/24/22 1710 05/25/22 0558 05/26/22 0407 05/27/22 0321 05/28/22 3254  NA 131*  --  130* 133* 131* 131*  K 3.2*  --  3.8 3.3* 4.4 3.6  CL 98  --  97* 97* 94* 95*  CO2 26  --  26 27 15* 26  GLUCOSE 174*  --  222* 159* 407* 219*  BUN 40*  --  50* 32* 43* 24*  CREATININE 3.24*  --  3.67* 2.92* 4.19* 2.75*  CALCIUM 8.1*  --  7.8* 8.2* 8.4* 8.1*  MG  --   --   --  2.1 2.0 1.7  PHOS  --  3.3  --  3.4 5.1* 3.4   Liver Function Tests: Recent Labs  Lab 05/22/22 2220  AST 25  ALT 9  ALKPHOS 110  BILITOT <0.1*  PROT 5.9*  ALBUMIN 2.1*   No results for input(s): "LIPASE", "AMYLASE" in the last 168 hours. No results for input(s): "AMMONIA" in the last 168 hours. CBC: Recent Labs  Lab 05/22/22 2220 05/22/22 2253 05/23/22 0424 05/23/22 1811 05/25/22 0558 05/25/22 1640 05/26/22 0410 05/27/22 0321 05/28/22 0549  WBC 9.4  --  9.8   < > 10.4 14.4* 13.2* 16.2* 9.8  NEUTROABS 7.6  --  7.8*  --   --   --   --   --   --   HGB 9.0*   < > 7.8*   < > 9.0* 10.3* 9.7* 9.6* 9.1*  HCT 32.1*   < > 25.9*   < > 27.7* 31.6* 30.1* 31.5* 29.9*  MCV 85.1  --  85.5   < > 84.5 83.4 83.8 89.0 89.3  PLT 265  --  258   < > 180 246 247 233 188   < > = values in this interval not displayed.   Cardiac Enzymes: No results for input(s): "CKTOTAL", "CKMB", "CKMBINDEX", "TROPONINI" in the last 168 hours. BNP: Invalid input(s): "POCBNP" CBG: Recent Labs  Lab 05/27/22 1957 05/27/22 2308 05/28/22 0051 05/28/22 0455 05/28/22 0731  GLUCAP 131* 191* 223* 234* 246*   D-Dimer No results for input(s): "DDIMER" in the last 72 hours. Hgb A1c No results for input(s): "HGBA1C" in the last 72 hours. Lipid Profile No results for input(s): "CHOL", "HDL", "LDLCALC", "TRIG", "CHOLHDL", "LDLDIRECT" in the last 72 hours. Thyroid function studies No results for input(s): "TSH", "T4TOTAL", "T3FREE", "THYROIDAB" in the last 72 hours.  Invalid input(s): "FREET3" Anemia work up No results for  input(s): "VITAMINB12", "FOLATE", "FERRITIN", "TIBC", "IRON", "RETICCTPCT" in the last 72 hours. Urinalysis    Component Value Date/Time   COLORURINE YELLOW 02/05/2022 1520   APPEARANCEUR CLOUDY (A) 02/05/2022 1520   LABSPEC 1.010 02/05/2022 1520   PHURINE 5.0 02/05/2022 1520   GLUCOSEU >=500 (A) 02/05/2022 1520   HGBUR SMALL (A) 02/05/2022 1520   BILIRUBINUR NEGATIVE 02/05/2022 1520   BILIRUBINUR negative 06/05/2020 1334   KETONESUR NEGATIVE 02/05/2022 1520   PROTEINUR 30 (A) 02/05/2022 1520   UROBILINOGEN 0.2 06/05/2020 1334   UROBILINOGEN 0.2 04/01/2014 1659   NITRITE NEGATIVE 02/05/2022 1520   LEUKOCYTESUR LARGE (A) 02/05/2022 1520   Sepsis Labs Recent Labs  Lab 05/25/22 1640 05/26/22 0410 05/27/22 0321 05/28/22 0549  WBC 14.4* 13.2* 16.2* 9.8   Microbiology Recent Results (from the past 240 hour(s))  MRSA Next Gen by PCR, Nasal     Status: None   Collection Time: 05/23/22  4:12 PM   Specimen: Nasal Mucosa; Nasal Swab  Result Value Ref Range Status   MRSA by PCR Next Gen NOT DETECTED NOT DETECTED Final    Comment: (  NOTE) The GeneXpert MRSA Assay (FDA approved for NASAL specimens only), is one component of a comprehensive MRSA colonization surveillance program. It is not intended to diagnose MRSA infection nor to guide or monitor treatment for MRSA infections. Test performance is not FDA approved in patients less than 3 years old. Performed at [facility], [place] [address] [place], [place] [address]   Gastrointestinal Panel by PCR , Stool     Status: None   Collection Time: 05/24/22 11:37 AM   Specimen: Stool  Result Value Ref Range Status   Campylobacter species NOT DETECTED NOT DETECTED Final   Plesimonas shigelloides NOT DETECTED NOT DETECTED Final   Salmonella species NOT DETECTED NOT DETECTED Final   Yersinia enterocolitica NOT DETECTED NOT DETECTED Final   Vibrio species NOT DETECTED NOT DETECTED Final   Vibrio cholerae NOT DETECTED NOT DETECTED  Final   Enteroaggregative E coli (EAEC) NOT DETECTED NOT DETECTED Final   Enteropathogenic E coli (EPEC) NOT DETECTED NOT DETECTED Final   Enterotoxigenic E coli (ETEC) NOT DETECTED NOT DETECTED Final   Shiga like toxin producing E coli (STEC) NOT DETECTED NOT DETECTED Final   Shigella/Enteroinvasive E coli (EIEC) NOT DETECTED NOT DETECTED Final   Cryptosporidium NOT DETECTED NOT DETECTED Final   Cyclospora cayetanensis NOT DETECTED NOT DETECTED Final   Entamoeba histolytica NOT DETECTED NOT DETECTED Final   Giardia lamblia NOT DETECTED NOT DETECTED Final   Adenovirus F40/41 NOT DETECTED NOT DETECTED Final   Astrovirus NOT DETECTED NOT DETECTED Final   Norovirus GI/GII NOT DETECTED NOT DETECTED Final   Rotavirus A NOT DETECTED NOT DETECTED Final   Sapovirus (I, II, IV, and V) NOT DETECTED NOT DETECTED Final    Comment: Performed at [facility], [address] [place], [place] [address]     Time coordinating discharge: Over 30 minutes  SIGNED:   Darliss Cheney, MD  [facility] 05/28/2022, 10:52 AM *Please note that this is a verbal dictation therefore any spelling or grammatical errors are due to the "[employer]" system interpretation. If 7PM-7AM, please contact night-coverage [email or web address]

## 2022-05-28 NOTE — Progress Notes (Addendum)
2:25pm: CSW attempted to reach patient's husband Percell Miller without success - a voicemail was left requesting a return call.  2pm: CSW spoke with Tammy at [facility] to obtain authorization details:  SNF [facility] #[id number] [facility] #[id number]  1:15pm: CSW has been accepted at [facility].  Olivia Mackie, renal navigator has changed patient's HD chair time to MWF at 11:15am at [facility] SW per [facility] request.  Patient can be transferred to [facility], 05/29/22.  11am: [facility] cannot accept the patient due to her HD chair time.  CSW spoke with Whitney at [facility] who states the facility cannot accept the patient due to HD.  CSW spoke with Soy at [facility] who is agreeable to review referral.  10am: CSW spoke with patient at bedside who states she is refusing to return to [facility] and wants placement elsewhere. CSW presented patient with additional options that are in network with [facility] and she preferred [facility]. Patient receives HD at [facility] - chair time of 6am, TTS schedule.  CSW spoke with Lexine Baton at [facility] to request a review of patient to determine if a bed offer can be made.  Madilyn Fireman, MSW, LCSW Transitions of Care  Clinical Social Worker II [phone]

## 2022-05-29 ENCOUNTER — Telehealth: Payer: Self-pay

## 2022-05-29 DIAGNOSIS — I5020 Unspecified systolic (congestive) heart failure: Secondary | ICD-10-CM | POA: Diagnosis not present

## 2022-05-29 DIAGNOSIS — E1122 Type 2 diabetes mellitus with diabetic chronic kidney disease: Secondary | ICD-10-CM | POA: Diagnosis present

## 2022-05-29 DIAGNOSIS — D630 Anemia in neoplastic disease: Secondary | ICD-10-CM | POA: Diagnosis not present

## 2022-05-29 DIAGNOSIS — E1152 Type 2 diabetes mellitus with diabetic peripheral angiopathy with gangrene: Secondary | ICD-10-CM | POA: Diagnosis present

## 2022-05-29 DIAGNOSIS — I5032 Chronic diastolic (congestive) heart failure: Secondary | ICD-10-CM | POA: Diagnosis present

## 2022-05-29 DIAGNOSIS — R279 Unspecified lack of coordination: Secondary | ICD-10-CM | POA: Diagnosis not present

## 2022-05-29 DIAGNOSIS — R109 Unspecified abdominal pain: Secondary | ICD-10-CM | POA: Diagnosis not present

## 2022-05-29 DIAGNOSIS — F32A Depression, unspecified: Secondary | ICD-10-CM | POA: Diagnosis present

## 2022-05-29 DIAGNOSIS — T8130XA Disruption of wound, unspecified, initial encounter: Secondary | ICD-10-CM | POA: Diagnosis not present

## 2022-05-29 DIAGNOSIS — D689 Coagulation defect, unspecified: Secondary | ICD-10-CM | POA: Diagnosis not present

## 2022-05-29 DIAGNOSIS — K626 Ulcer of anus and rectum: Secondary | ICD-10-CM | POA: Diagnosis not present

## 2022-05-29 DIAGNOSIS — I739 Peripheral vascular disease, unspecified: Secondary | ICD-10-CM | POA: Diagnosis not present

## 2022-05-29 DIAGNOSIS — E1143 Type 2 diabetes mellitus with diabetic autonomic (poly)neuropathy: Secondary | ICD-10-CM | POA: Diagnosis present

## 2022-05-29 DIAGNOSIS — N186 End stage renal disease: Secondary | ICD-10-CM | POA: Diagnosis present

## 2022-05-29 DIAGNOSIS — R9431 Abnormal electrocardiogram [ECG] [EKG]: Secondary | ICD-10-CM | POA: Diagnosis not present

## 2022-05-29 DIAGNOSIS — S301XXA Contusion of abdominal wall, initial encounter: Secondary | ICD-10-CM | POA: Diagnosis not present

## 2022-05-29 DIAGNOSIS — E039 Hypothyroidism, unspecified: Secondary | ICD-10-CM | POA: Diagnosis present

## 2022-05-29 DIAGNOSIS — E1165 Type 2 diabetes mellitus with hyperglycemia: Secondary | ICD-10-CM | POA: Diagnosis present

## 2022-05-29 DIAGNOSIS — Z794 Long term (current) use of insulin: Secondary | ICD-10-CM | POA: Diagnosis not present

## 2022-05-29 DIAGNOSIS — K6289 Other specified diseases of anus and rectum: Secondary | ICD-10-CM | POA: Diagnosis not present

## 2022-05-29 DIAGNOSIS — K8020 Calculus of gallbladder without cholecystitis without obstruction: Secondary | ICD-10-CM | POA: Diagnosis not present

## 2022-05-29 DIAGNOSIS — J9811 Atelectasis: Secondary | ICD-10-CM | POA: Diagnosis not present

## 2022-05-29 DIAGNOSIS — R1084 Generalized abdominal pain: Secondary | ICD-10-CM | POA: Diagnosis not present

## 2022-05-29 DIAGNOSIS — T8131XA Disruption of external operation (surgical) wound, not elsewhere classified, initial encounter: Secondary | ICD-10-CM | POA: Diagnosis not present

## 2022-05-29 DIAGNOSIS — I120 Hypertensive chronic kidney disease with stage 5 chronic kidney disease or end stage renal disease: Secondary | ICD-10-CM | POA: Diagnosis not present

## 2022-05-29 DIAGNOSIS — I499 Cardiac arrhythmia, unspecified: Secondary | ICD-10-CM | POA: Diagnosis not present

## 2022-05-29 DIAGNOSIS — Z992 Dependence on renal dialysis: Secondary | ICD-10-CM | POA: Diagnosis not present

## 2022-05-29 DIAGNOSIS — I950 Idiopathic hypotension: Secondary | ICD-10-CM | POA: Diagnosis not present

## 2022-05-29 DIAGNOSIS — N2581 Secondary hyperparathyroidism of renal origin: Secondary | ICD-10-CM | POA: Diagnosis not present

## 2022-05-29 DIAGNOSIS — K5289 Other specified noninfective gastroenteritis and colitis: Secondary | ICD-10-CM | POA: Diagnosis not present

## 2022-05-29 DIAGNOSIS — I480 Paroxysmal atrial fibrillation: Secondary | ICD-10-CM | POA: Diagnosis present

## 2022-05-29 DIAGNOSIS — R41841 Cognitive communication deficit: Secondary | ICD-10-CM | POA: Diagnosis not present

## 2022-05-29 DIAGNOSIS — K5900 Constipation, unspecified: Secondary | ICD-10-CM | POA: Diagnosis not present

## 2022-05-29 DIAGNOSIS — E1142 Type 2 diabetes mellitus with diabetic polyneuropathy: Secondary | ICD-10-CM | POA: Diagnosis present

## 2022-05-29 DIAGNOSIS — M6281 Muscle weakness (generalized): Secondary | ICD-10-CM | POA: Diagnosis not present

## 2022-05-29 DIAGNOSIS — Z8619 Personal history of other infectious and parasitic diseases: Secondary | ICD-10-CM | POA: Diagnosis not present

## 2022-05-29 DIAGNOSIS — D7389 Other diseases of spleen: Secondary | ICD-10-CM | POA: Diagnosis not present

## 2022-05-29 DIAGNOSIS — R1111 Vomiting without nausea: Secondary | ICD-10-CM | POA: Diagnosis not present

## 2022-05-29 DIAGNOSIS — I959 Hypotension, unspecified: Secondary | ICD-10-CM | POA: Diagnosis not present

## 2022-05-29 DIAGNOSIS — Z743 Need for continuous supervision: Secondary | ICD-10-CM | POA: Diagnosis not present

## 2022-05-29 DIAGNOSIS — A0472 Enterocolitis due to Clostridium difficile, not specified as recurrent: Secondary | ICD-10-CM | POA: Diagnosis not present

## 2022-05-29 DIAGNOSIS — T8781 Dehiscence of amputation stump: Secondary | ICD-10-CM | POA: Diagnosis not present

## 2022-05-29 DIAGNOSIS — I509 Heart failure, unspecified: Secondary | ICD-10-CM | POA: Diagnosis not present

## 2022-05-29 DIAGNOSIS — D631 Anemia in chronic kidney disease: Secondary | ICD-10-CM | POA: Diagnosis present

## 2022-05-29 DIAGNOSIS — I25119 Atherosclerotic heart disease of native coronary artery with unspecified angina pectoris: Secondary | ICD-10-CM | POA: Diagnosis not present

## 2022-05-29 DIAGNOSIS — Z1152 Encounter for screening for COVID-19: Secondary | ICD-10-CM | POA: Diagnosis not present

## 2022-05-29 DIAGNOSIS — K5792 Diverticulitis of intestine, part unspecified, without perforation or abscess without bleeding: Secondary | ICD-10-CM | POA: Diagnosis present

## 2022-05-29 DIAGNOSIS — N250 Renal osteodystrophy: Secondary | ICD-10-CM | POA: Diagnosis not present

## 2022-05-29 DIAGNOSIS — R2689 Other abnormalities of gait and mobility: Secondary | ICD-10-CM | POA: Diagnosis not present

## 2022-05-29 DIAGNOSIS — Z79899 Other long term (current) drug therapy: Secondary | ICD-10-CM | POA: Diagnosis not present

## 2022-05-29 DIAGNOSIS — E1310 Other specified diabetes mellitus with ketoacidosis without coma: Secondary | ICD-10-CM | POA: Diagnosis not present

## 2022-05-29 DIAGNOSIS — S36029A Unspecified contusion of spleen, initial encounter: Secondary | ICD-10-CM | POA: Diagnosis not present

## 2022-05-29 DIAGNOSIS — Z89511 Acquired absence of right leg below knee: Secondary | ICD-10-CM | POA: Diagnosis not present

## 2022-05-29 DIAGNOSIS — R739 Hyperglycemia, unspecified: Secondary | ICD-10-CM | POA: Diagnosis not present

## 2022-05-29 DIAGNOSIS — Y835 Amputation of limb(s) as the cause of abnormal reaction of the patient, or of later complication, without mention of misadventure at the time of the procedure: Secondary | ICD-10-CM | POA: Diagnosis present

## 2022-05-29 DIAGNOSIS — J9611 Chronic respiratory failure with hypoxia: Secondary | ICD-10-CM | POA: Diagnosis not present

## 2022-05-29 DIAGNOSIS — I5022 Chronic systolic (congestive) heart failure: Secondary | ICD-10-CM | POA: Diagnosis not present

## 2022-05-29 DIAGNOSIS — I5042 Chronic combined systolic (congestive) and diastolic (congestive) heart failure: Secondary | ICD-10-CM | POA: Diagnosis not present

## 2022-05-29 DIAGNOSIS — N281 Cyst of kidney, acquired: Secondary | ICD-10-CM | POA: Diagnosis not present

## 2022-05-29 DIAGNOSIS — K5732 Diverticulitis of large intestine without perforation or abscess without bleeding: Secondary | ICD-10-CM | POA: Diagnosis present

## 2022-05-29 DIAGNOSIS — R58 Hemorrhage, not elsewhere classified: Secondary | ICD-10-CM | POA: Diagnosis not present

## 2022-05-29 DIAGNOSIS — I132 Hypertensive heart and chronic kidney disease with heart failure and with stage 5 chronic kidney disease, or end stage renal disease: Secondary | ICD-10-CM | POA: Diagnosis present

## 2022-05-29 DIAGNOSIS — D62 Acute posthemorrhagic anemia: Secondary | ICD-10-CM | POA: Diagnosis not present

## 2022-05-29 DIAGNOSIS — J9610 Chronic respiratory failure, unspecified whether with hypoxia or hypercapnia: Secondary | ICD-10-CM | POA: Diagnosis present

## 2022-05-29 DIAGNOSIS — K922 Gastrointestinal hemorrhage, unspecified: Secondary | ICD-10-CM | POA: Diagnosis not present

## 2022-05-29 DIAGNOSIS — I4891 Unspecified atrial fibrillation: Secondary | ICD-10-CM | POA: Diagnosis not present

## 2022-05-29 DIAGNOSIS — R110 Nausea: Secondary | ICD-10-CM | POA: Diagnosis not present

## 2022-05-29 DIAGNOSIS — E669 Obesity, unspecified: Secondary | ICD-10-CM | POA: Diagnosis present

## 2022-05-29 DIAGNOSIS — R2681 Unsteadiness on feet: Secondary | ICD-10-CM | POA: Diagnosis not present

## 2022-05-29 DIAGNOSIS — E871 Hypo-osmolality and hyponatremia: Secondary | ICD-10-CM | POA: Diagnosis not present

## 2022-05-29 DIAGNOSIS — R197 Diarrhea, unspecified: Secondary | ICD-10-CM | POA: Diagnosis not present

## 2022-05-29 DIAGNOSIS — D735 Infarction of spleen: Secondary | ICD-10-CM | POA: Diagnosis present

## 2022-05-29 DIAGNOSIS — D638 Anemia in other chronic diseases classified elsewhere: Secondary | ICD-10-CM | POA: Diagnosis not present

## 2022-05-29 LAB — CBC
HCT: 27.4 % — ABNORMAL LOW (ref 36.0–46.0)
Hemoglobin: 8.5 g/dL — ABNORMAL LOW (ref 12.0–15.0)
MCH: 27.6 pg (ref 26.0–34.0)
MCHC: 31 g/dL (ref 30.0–36.0)
MCV: 89 fL (ref 80.0–100.0)
Platelets: 199 10*3/uL (ref 150–400)
RBC: 3.08 MIL/uL — ABNORMAL LOW (ref 3.87–5.11)
RDW: 20.6 % — ABNORMAL HIGH (ref 11.5–15.5)
WBC: 12.4 10*3/uL — ABNORMAL HIGH (ref 4.0–10.5)
nRBC: 0 % (ref 0.0–0.2)

## 2022-05-29 LAB — MAGNESIUM: Magnesium: 1.6 mg/dL — ABNORMAL LOW (ref 1.7–2.4)

## 2022-05-29 LAB — PHOSPHORUS: Phosphorus: 3.9 mg/dL (ref 2.5–4.6)

## 2022-05-29 LAB — GLUCOSE, CAPILLARY
Glucose-Capillary: 455 mg/dL — ABNORMAL HIGH (ref 70–99)
Glucose-Capillary: 341 mg/dL — ABNORMAL HIGH (ref 70–99)

## 2022-05-29 MED ORDER — MIDODRINE HCL 5 MG PO TABS: 10.0000 mg | ORAL_TABLET | ORAL | Status: DC

## 2022-05-29 MED ORDER — INSULIN GLARGINE-YFGN 100 UNIT/ML ~~LOC~~ SOLN
40.0000 [IU] | Freq: Every day | SUBCUTANEOUS | Status: DC
  Administered 2022-05-29: 40 [IU] via SUBCUTANEOUS
  Filled 2022-05-29: qty 0.4

## 2022-05-29 MED ORDER — INSULIN ASPART 100 UNIT/ML IJ SOLN
15.0000 [IU] | Freq: Once | INTRAMUSCULAR | Status: AC
  Administered 2022-05-29: 15 [IU] via SUBCUTANEOUS

## 2022-05-29 MED ORDER — PANTOPRAZOLE SODIUM 40 MG PO TBEC
40.0000 mg | DELAYED_RELEASE_TABLET | Freq: Two times a day (BID) | ORAL | Status: DC
  Administered 2022-05-29: 40 mg via ORAL
  Filled 2022-05-29: qty 1

## 2022-05-29 MED ORDER — MIDODRINE HCL 10 MG PO TABS: 10.0000 mg | ORAL_TABLET | ORAL | 0 refills | Status: AC

## 2022-05-29 MED ORDER — MUPIROCIN CALCIUM 2 % EX CREA
TOPICAL_CREAM | Freq: Every day | CUTANEOUS | Status: DC
  Filled 2022-05-29: qty 15

## 2022-05-29 NOTE — Progress Notes (Signed)
Report given to [facility] LPN at [facility]

## 2022-05-29 NOTE — Progress Notes (Signed)
RN clarified with Pahwani in regards to novolog orders. Per Dr. Doristine Bosworth, to give 15 units of novolog insulin

## 2022-05-29 NOTE — Progress Notes (Signed)
Pt will d/c to [facility] today. Contacted [facility] SW to make clinic aware of pt's d/c today and that pt will resume at clinic tomorrow on MWF schedule as planned.   Melven Sartorius Renal Navigator [phone]

## 2022-05-29 NOTE — Anesthesia Postprocedure Evaluation (Signed)
Anesthesia Post Note  Patient: Susan [name]  Procedure(s) Performed: Jasmine December UNDER ANESTHESIA; OVERSEWING RECTAL ULCER (Rectum)     Patient location during evaluation: ICU Anesthesia Type: General Level of consciousness: awake and alert Pain management: pain level controlled Vital Signs Assessment: post-procedure vital signs reviewed and stable Respiratory status: spontaneous breathing, nonlabored ventilation, respiratory function stable and patient connected to nasal cannula oxygen Cardiovascular status: blood pressure returned to baseline and stable Postop Assessment: no apparent nausea or vomiting Anesthetic complications: no   No notable events documented.  Last Vitals:  Vitals:   05/29/22 0318 05/29/22 0815  BP: (!) 118/55 122/64  Pulse: 94 95  Resp: 18 18  Temp: 36.9 C 36.9 C  SpO2: 95% 98%    Last Pain:  Vitals:   05/29/22 0815  TempSrc: Oral  PainSc:                  Tiajuana Amass

## 2022-05-29 NOTE — Progress Notes (Signed)
CSW spoke with patient's husband to inform him of discharge plan.  Patient will discharge to [facility] via [facility] - transportation has been called. The number to call for report is [phone].  Madilyn Fireman, MSW, LCSW Transitions of Care  Clinical Social Worker II [phone]

## 2022-05-29 NOTE — Progress Notes (Addendum)
[facility] Progress Note  Subjective: Last HD on 1/2 with 2.5 kg UF per HD note - somehow 5 kg imports into spreadsheet - discussed with HD unit on 1/3.  She asks about plans for her SNF and I asked that she clarify this with her social worker and medical team   Review of systems:    She reports a little shortness of breath; denies chest pain  Denies n/v  Vitals:   05/28/22 1221 05/28/22 1556 05/28/22 2019 05/29/22 0318  BP: (!) 129/50 118/88 (!) 110/46 (!) 118/55  Pulse: 98 100 97 94  Resp:  '18 16 18  '$ Temp:  98 F (36.7 C) 98.2 F (36.8 C) 98.4 F (36.9 C)  TempSrc:  Oral Oral Oral  SpO2: 100% 96% 97% 95%  Weight:      Height:        Exam:  General adult female in bed in no acute distress   HEENT normocephalic atraumatic extraocular movements intact sclera anicteric Neck supple trachea midline Lungs clear to auscultation bilaterally normal work of breathing at rest on 3 liters oxygen per nasal cannula Heart S1S2 no rub Abdomen soft nontender nondistended Extremities right BKA; edema of right residual limb and 1-2+ edema of left leg (which is wrapped)  Psych no anxiety or agitation Neuro conversant and follows commands Access: RIJ TDC in place  (and L arm AVF+ bruit )      Home meds include - albuterol, pacerone, eliquis, coreg 6.25 bid, fidaxomicin, hydralazine 25 qid prn, insulin aspart, synthroid, linagliptin, pregabalin, renvela 2 ac tid, vortioxetine, prns/ vits/ supps        OP HD: SW TTS  4h  400/800   98.5kg  3K/2.5Ca bath  TDC/ AVF resting due to recent infection - mircera 150 mcg q2, last 12/26 - last Hb 8.8 on 12/21, due 06/03/22 - last HD 12/28, post wt 99.3 - no vdra     Assessment/ Plan: Lower GIB - went to OR for oversewing of a bleeding rectal ulcer  12/30. Per primary team  Shock - resolved, off pressors but remains on midodrine - lowering dose as below ESRD  Transitioning to MWF schedule for HD to align with outpatient schedule at  her new unit, [facility]. Next HD on 1/5 (here or outpatient) Question the accuracy of her last weight.  I have asked nursing to weigh her.   Will keep the midodrine 10 mg dose for pre-HD H/o HTN - we were holding home BP meds due to shock; note has now been started on midodrine as well.  Midodrine 10 mg dose had been intermittently held per [facility] - [facility] hx. Weaned midodrine to 5 mg TID but would recommend that she take a 10 mg dose pre-HD treatments.   Volume - UF w/ HD as tolerated and lower dry wt as able.  Anemia esrd as well as anemia secondary to acute blood loss with GI bleed - Recent mircera at outpatient HD unit.  Would be due again on 1/9 if still here (I have ordered). Follow and transfuse prn.   MBD ckd - CCa in range,  phos at goal  Uncont DM - per pmd  Disposition - per primary team.  Plan is for discharge today per charting.  Spoke with RN as patient is confused about plans for discharge to SNF - asked that they reach out to SW/primary team   Recent Labs  Lab 05/22/22 2220 05/22/22 2253 05/27/22 0321 05/28/22 0549  HGB 9.0*   < >  9.6* 9.1*  ALBUMIN 2.1*  --   --   --   CALCIUM 8.6*   < > 8.4* 8.1*  PHOS  --    < > 5.1* 3.4  CREATININE 2.80*   < > 4.19* 2.75*  K 3.6   < > 4.4 3.6   < > = values in this interval not displayed.   No results for input(s): "IRON", "TIBC", "FERRITIN" in the last 168 hours. Inpatient medications:  sodium chloride   Intravenous Once   sodium chloride   Intravenous Once   amiodarone  200 mg Oral Daily   Chlorhexidine Gluconate Cloth  6 each Topical Q0600   [START ON 06/03/2022] darbepoetin (ARANESP) injection - NON-DIALYSIS  150 mcg Subcutaneous Q Tue-1800   docusate sodium  100 mg Oral BID   insulin aspart  0-15 Units Subcutaneous TID WC   insulin glargine-yfgn  30 Units Subcutaneous Daily   leptospermum manuka honey  1 Application Topical Daily   levothyroxine  50 mcg Oral QAC breakfast   linagliptin  5 mg Oral Daily   midodrine  5 mg Oral TID  with meals   pantoprazole (PROTONIX) IV  40 mg Intravenous Q12H   polyethylene glycol  17 g Oral Daily   pregabalin  75 mg Oral Daily     acetaminophen, HYDROmorphone (DILAUDID) injection, ondansetron (ZOFRAN) IV, mouth rinse   Claudia Desanctis, MD 7:11 AM 05/29/2022

## 2022-05-29 NOTE — Discharge Summary (Signed)
Physician Discharge Summary  Susan [name] ZPH:[id number] DOB: [birth year] DOA: 05/22/2022  PCP: [name], MD  Admit date: 05/22/2022 Discharge date: 05/29/2022 30 Day Unplanned Readmission Risk Score    Flowsheet Row ED to Hosp-Admission (Current) from 05/22/2022 in [facility]  30 Day Unplanned Readmission Risk Score (%) 45.2 Filed at 05/28/2022 0801       This score is the patient's risk of an unplanned readmission within 30 days of being discharged (0 -100%). The score is based on dignosis, age, lab data, medications, orders, and past utilization.   Low:  0-14.9   Medium: 15-21.9   High: 22-29.9   Extreme: 30 and above          Admitted From: Home Disposition: SNF  Recommendations for Outpatient Follow-up:  Follow up with PCP in 1-2 weeks Please obtain BMP/CBC in one week Please follow up with your PCP on the following pending results: Unresulted Labs (From admission, onward)     Start     Ordered   05/26/22 0500  Magnesium  Daily,   R     Question:  Specimen collection method  Answer:  Unit=Unit collect   05/25/22 1026   05/26/22 0500  Phosphorus  Daily,   R     Question:  Specimen collection method  Answer:  Unit=Unit collect   05/25/22 1026              Home Health: None Equipment/Devices: None  Discharge Condition: Stable CODE STATUS: DNR Diet recommendation: Renal  Subjective: Seen and examined.  She has no new complaint.  Brief/Interim Summary: 72 year old female with end-stage renal disease on hemodialysis, coronary artery disease, paroxysmal A-fib on Eliquis, diabetes and hypertension was brought in the emergency department with complaint of abdominal pain and diarrhea for last few days.  Patient stated, that few days ago she started with dark bloody bowel movements initially it was 2-3 times a day but yesterday it happened 6-7 times and she felt weak so was brought to the emergency department.  Initially she was found to be  hypotensive, requiring IV fluid with improvement in blood pressure, in the emergency department she had multiple large bowel movement containing clots.  GI was consulted they recommend CT angiogram abdomen and pelvis, initial CT angio was negative.  Patient is compliant with her Eliquis, she received Kcentra for anticoagulation reversal, as she was actively bleeding, she was admitted in ICU under PCCM care, subsequently transferred under [facility] on 05/27/22.  Details as below.   Acute blood loss anemia secondary to acute lower GI bleed/rectal ulcer: Presented with active lower GI bleed, CTA was negative for active bleeding, GI consulted, they tried sigmoidoscopy x 2 on 11/29 and 11/30 which were failed and subsequently general surgery was consulted and patient underwent exam under anesthesia and oversewing of rectal ulcer on 05/24/2022 with Dr. Kieth Brightly.  Patient has received total of 4 units of PRBC transfusion during this hospitalization.  Patient's hemoglobin has remained stable since 05/26/2022.  GI as well as general surgery has signed off.  Patient is now stable and is being discharged to SNF today.   Chronic diarrhea: Per PCCM, her history consistently has been variable, C. difficile ordered on admit but never sent.  Per them, she does not meet criteria for C. difficile anymore.  She is afebrile with no leukocytosis.  Her diarrhea is improving.   Chronic hypoxic respiratory failure secondary to chronic systolic congestive heart failure: She tells me that she uses 2  to 3 L of oxygen at home, she is at baseline.  She appears to be euvolemic.  She is on dialysis.   ESRD on HD: Nephrology on board.  She is on TTS schedule.  Nephrology wants the patient to get midodrine 5 mg 3 times daily on nondialysis days and 10 mg before dialysis on dialysis days, Monday Wednesday Friday.   Paroxysmal atrial fibrillation: Patient appears to be taking Coreg and amiodarone at home, both of them were hold.  Patient's blood  pressure was on low normal side so we only resumed amiodarone.  Patient's blood pressure remains on the low normal side but patient is totally asymptomatic and lactic acid is normal so at this point in time, I am discharging her on amiodarone only but discontinuing Coreg.  We are also discontinuing Eliquis for recent GI bleed however this can be considered to rechallenge in next couple of weeks.  Will defer to PCP or GI.  Patient's aspirin was also on hold, I have advised the patient to resume aspirin in 1 week.  Acquired hypothyroidism: Resume Synthroid.   Uncontrolled type 2 diabetes mellitus with hyperglycemia: Patient uses insulin pump at home which she will resume.  Here she was managed with long-acting insulin and SSI.   History of essential hypertension: Still on the low normal side, for this, she was started on midodrine 10 mg 3 times daily which was reduced to 5 mg 3 times daily yesterday by nephrology.  This should be tapered gradually over the course of next few days to few weeks based on patient's blood pressure.  If blood pressure goes too high, Coreg can be resumed as well.   Peripheral neuropathy: Resume home medications.   Right leg venous ulcer bleeding status post right BKA:   History of recurrent C. difficile colitis: He saw ID on 03/26/2022 and was prescribed fidaxomicin which she completed therapy on 04/06/2022.  She still have diarrhea but she is improving.  She has no signs of infection.   Disposition: Seen by PT OT they recommend SNF.  Patient is being discharged today.   Addendum 05/29/2022 9:45 AM: Patient was discharged on 05/28/2022 to [facility] but after the discharge is completed, [facility] so a different [facility] could not be arranged yesterday so patient ended up staying in the hospital, per [facility], different SNF has been arranged for the patient today and she is being discharged in a stable condition.  There has been no change in medical  condition of the patient compared to yesterday.     Discharge Diagnoses:  Active Problems:   Hypotension   Chronic HFrEF (heart failure with reduced ejection fraction) (HCC)   Hypothyroidism   Acute blood loss anemia   Type 2 diabetes mellitus with diabetic polyneuropathy, with long-term current use of insulin (HCC)   Paroxysmal atrial fibrillation (HCC)   Diarrhea   S/P BKA (below knee amputation) unilateral, right (HCC)   GI bleed   Difficult intravenous access   Rectal ulcer   ESRD on dialysis [facility])    Discharge Instructions   Allergies as of 05/29/2022       Reactions   Keflex [cephalexin] Diarrhea   Codeine Nausea And Vomiting, Other (See Comments)        Medication List     STOP taking these medications    apixaban 5 MG Tabs tablet Commonly known as: ELIQUIS   carvedilol 6.25 MG tablet Commonly known as: COREG  TAKE these medications    albuterol 108 (90 Base) MCG/ACT inhaler Commonly known as: VENTOLIN HFA Inhale 1-2 puffs into the lungs every 6 (six) hours as needed for wheezing or shortness of breath.   amiodarone 200 MG tablet Commonly known as: PACERONE Take 1 tablet (200 mg total) by mouth daily.   Anusol-HC 2.5 % rectal cream Generic drug: hydrocortisone Place 1 Application rectally 2 (two) times daily.   aspirin EC 81 MG tablet Take 1 tablet (81 mg total) by mouth daily with breakfast. Start taking on: June 04, 2022 What changed: These instructions start on June 04, 2022. If you are unsure what to do until then, ask your doctor or other care provider.   DIALYVITE TABLET Tabs Take 1 tablet by mouth daily.   diphenoxylate-atropine 2.5-0.025 MG tablet Commonly known as: Lomotil Take 1 tablet by mouth 4 (four) times daily as needed for diarrhea or loose stools.   feeding supplement (GLUCERNA 1.2 CAL) Liqd Take 237 mLs by mouth daily.   feeding supplement (PRO-STAT SUGAR FREE 64) Liqd Take 30 mLs by mouth in the morning and  at bedtime.   ferrous sulfate 325 (65 FE) MG tablet Take 325 mg by mouth 2 (two) times daily.   fidaxomicin 200 MG Tabs tablet Commonly known as: DIFICID Take 1 tablet (200 mg total) by mouth 2 (two) times daily.   folic acid-vitamin b complex-vitamin c-selenium-zinc 3 MG Tabs tablet Take 1 tablet by mouth daily.   FreeStyle Libre 2 Sensor Misc   hydrALAZINE 25 MG tablet Commonly known as: APRESOLINE Take 1 tablet (25 mg total) by mouth every 6 (six) hours as needed (SBP>150 or DBP>100). What changed:  when to take this additional instructions   levothyroxine 50 MCG tablet Commonly known as: SYNTHROID Take 1 tablet (50 mcg total) by mouth daily before breakfast.   linagliptin 5 MG Tabs tablet Commonly known as: TRADJENTA Take 1 tablet (5 mg total) by mouth daily.   magnesium oxide 400 (240 Mg) MG tablet Commonly known as: MAG-OX Take 1 tablet (400 mg total) by mouth daily.   midodrine 5 MG tablet Commonly known as: PROAMATINE Take 1 tablet (5 mg total) by mouth 3 (three) times daily with meals.   midodrine 10 MG tablet Commonly known as: PROAMATINE Take 1 tablet (10 mg total) by mouth every Monday, Wednesday, and Friday with hemodialysis. Start taking on: May 30, 2022   NovoLOG FlexPen 100 UNIT/ML FlexPen Generic drug: insulin aspart Inject 0-15 Units into the skin See admin instructions. CBG <70 = Notify NP/MD 71-149 = 0 units 150-199 = 4 units 200-249 = 6 units 250- 299 = 10 units 300- 349 = 12 units 350- 399 = 15 units, > 400 call MD ( prime pen with 2 units prior  to set dose)   ondansetron 4 MG tablet Commonly known as: ZOFRAN Take 4 mg by mouth every 8 (eight) hours as needed for nausea.   OneTouch Delica Plus [id number] Misc Use as directed to check blood sugars as need up to 4 times daily   OneTouch Verio Flex System w/Device Kit Use up to four times daily as directed.   oxyCODONE-acetaminophen 5-325 MG tablet Commonly known as:  PERCOCET/ROXICET Take 1 tablet by mouth every 4 (four) hours as needed.   OXYGEN Inhale 2 L/min into the lungs at bedtime.   pantoprazole 40 MG tablet Commonly known as: Protonix Take 1 tablet (40 mg total) by mouth daily.   Pentips 32G X 4 MM Misc Generic  drug: Insulin Pen Needle Use as directed   polyethylene glycol 17 g packet Commonly known as: MIRALAX / GLYCOLAX Take 17 g by mouth daily.   pregabalin 75 MG capsule Commonly known as: LYRICA Take 1 capsule (75 mg total) by mouth daily.   saccharomyces boulardii 250 MG capsule Commonly known as: FLORASTOR Take 500 mg by mouth 2 (two) times daily. X14 days   senna-docusate 8.6-50 MG tablet Commonly known as: Senokot-S Take 2 tablets by mouth at bedtime.   sevelamer carbonate 800 MG tablet Commonly known as: RENVELA Take 1,600 mg by mouth 3 (three) times daily with meals.   vortioxetine HBr 10 MG Tabs tablet Commonly known as: Trintellix Take 2 tablets (20 mg total) by mouth daily.        Contact information for follow-up providers     [name], MD Follow up in 1 week(s).   Specialty: Family Medicine Contact information: [address] [place] [address] [phone]         [place] [place]. Go on 05/30/2022.   Why: Schedule is Monday/Wednesday/Friday with 11:15 am chair time. Contact information: [place] [address] [phone]              Contact information for after-discharge care     Destination     [facility] Preferred SNF .   Service: Skilled Nursing Contact information: [place] [phone]                    Allergies  Allergen Reactions   Keflex [Cephalexin] Diarrhea   Codeine Nausea And Vomiting and Other (See Comments)    Consultations: GI, GI, general surgery, PCCM   Procedures/Studies: DG CHEST PORT 1 VIEW  Result Date: 05/26/2022 CLINICAL DATA:   Shortness of breath EXAM: PORTABLE CHEST 1 VIEW COMPARISON:  May 23, 2022 FINDINGS: The left and right central lines terminate in the SVC. No pneumothorax. No pulmonary nodules, infiltrates, or overt edema. The cardiomediastinal silhouette is stable. No other acute abnormalities are identified. IMPRESSION: 1. Support apparatus as above. 2. No acute abnormalities. Electronically Signed   By: Dorise Bullion III M.D.   On: 05/26/2022 11:05   DG Chest Port 1 View  Result Date: 05/23/2022 CLINICAL DATA:  Caliber central line placement EXAM: PORTABLE CHEST 1 VIEW COMPARISON:  04/02/2022 FINDINGS: Interval placement of a LEFT central venous line with tip in the distal SVC at the cavoatrial junction. No pneumothorax. Large bore RIGHT central venous catheter unchanged. Low lung volumes. IMPRESSION: Central venous line with tip at the cavoatrial junction. No pneumothorax Electronically Signed   By: Suzy Bouchard M.D.   On: 05/23/2022 15:01   CT Angio Abd/Pel W and/or Wo Contrast  Result Date: 05/23/2022 CLINICAL DATA:  Ongoing lower GI bleed. EXAM: CTA ABDOMEN AND PELVIS WITHOUT AND WITH CONTRAST TECHNIQUE: Multidetector CT imaging of the abdomen and pelvis was performed using the standard protocol during bolus administration of intravenous contrast. Multiplanar reconstructed images and MIPs were obtained and reviewed to evaluate the vascular anatomy. RADIATION DOSE REDUCTION: This exam was performed according to the departmental dose-optimization program which includes automated exposure control, adjustment of the mA and/or kV according to patient size and/or use of iterative reconstruction technique. CONTRAST:  180m OMNIPAQUE IOHEXOL 350 MG/ML SOLN COMPARISON:  CTA abdomen and pelvis earlier today at 12:53 a.m., CT abdomen pelvis without contrast 03/27/2022 and 08/22/2020. FINDINGS: VASCULAR Aorta: Moderate calcific plaque. No  aneurysm, dissection or stenosis. Celiac: Moderate calcification noted without  flow-limiting celiac stenosis, with circumferential calcifications in the branch arteries and extensive small branch arterial calcifications within the spleen and central liver. SMA: There is moderate calcification proximal to the inflection point without stenosis, circumferential calcification distal to the inflection point. No major-vessel stenosis. Renals: There are calcifications moderately in both proximal renal arteries but no hemodynamically significant (greater than 50%) stenosis. Calcifications continue into the hila and parenchyma along the branch arteries. IMA: Circumferentially calcified with severe origin stenosis. Flow is otherwise seen as previously. Inflow: No interval change from earlier today. Calcifications are greatest in the common iliac arteries and internal iliac arteries, but no stenosis greater than 50% is seen. There is only mild calcification in the external iliac arteries no stenosis. Proximal Outflow: The proximal outflow vessels demonstrate bilateral calcifications without flow-limiting stenoses. Veins: Patent. Review of the MIP images confirms the above findings. NON-VASCULAR Lower chest: No acute abnormality. Hepatobiliary: Scattered calcified granulomas. Capsular nodularity suggesting at least early cirrhosis with mild hepatic steatosis. No mass enhancement. There is cholelithiasis without bile duct dilatation or cholecystitis. Pancreas: Diffusely atrophic.  No other focal abnormality. Spleen: Mildly enlarged with scattered calcified granulomas, 15 cm AP. No focal abnormality. Adrenals/Urinary Tract: No new abnormality. Extensive renovascular calcifications. Stomach/Bowel: Wall thickening of the distal sigmoid colon and rectum is again noted with adjacent stranding. In contrast to the earlier study today, there is now an irregular contrast blush into the lumen from the left dorsal aspect of the distal rectal wall on arterial phase series 8 axial images 192-201, with further diffusion  of leaked contrast into the rectal content on venous phase series 10 axial images 133-191. The contrast blush in the arterial phase is somewhat serpiginous with a least 1 and possibly 2 sites of active leakage. Upstream diverticulosis and constipation are redemonstrated. Lymphatic: No adenopathy. Reproductive: Status post hysterectomy. No adnexal masses. Other: Mild body wall edema. Minimal deep pelvic ascites is similar. Musculoskeletal: Osteopenia and degenerative change. IMPRESSION: 1. There is an irregular, somewhat serpiginous contrast blush into the distal rectum from the left dorsal wall on the arterial phase with further diffusion of the leaked contrast into the rectal content on the venous phase. Findings are consistent with active GI bleeding. 2. Extensive vascular calcifications, especially of small intraorgan branch arteries. 3. Wall thickening of the distal sigmoid colon and rectum with adjacent stranding. Findings consistent with proctocolitis. 4. Upstream diverticulosis and constipation. 5. Cirrhotic liver morphology with mild splenomegaly. The main portal vein is normal caliber. 6. Cholelithiasis. 7. Mild body wall edema and minimal pelvic ascites. 8. Osteopenia and degenerative change. 9. Aortic atherosclerosis. 10. Additional redemonstrated findings described above as well as earlier today. 11. These results will be called to the ordering clinician or representative by the radiologist assistant, and communication documented in the PACS or [place] dashboard. Aortic Atherosclerosis (ICD10-I70.0). Electronically Signed   By: Telford Nab M.D.   On: 05/23/2022 06:28   CT Angio Abd/Pel W and/or Wo Contrast  Result Date: 05/23/2022 CLINICAL DATA:  Lower GI bleed.  Bloody diarrhea x4 days. EXAM: CTA ABDOMEN AND PELVIS WITHOUT AND WITH CONTRAST TECHNIQUE: Multidetector CT imaging of the abdomen and pelvis was performed using the standard protocol during bolus administration of intravenous contrast.  Multiplanar reconstructed images and MIPs were obtained and reviewed to evaluate the vascular anatomy. RADIATION DOSE REDUCTION: This exam was performed according to the departmental dose-optimization program which includes automated exposure control, adjustment of the mA and/or kV according  to patient size and/or use of iterative reconstruction technique. CONTRAST:  173m OMNIPAQUE IOHEXOL 350 MG/ML SOLN COMPARISON:  CT of abdomen and pelvis studies, both without contrast, dated 111/21/23and 08/22/2020. There is also an MRCP from 02/01/2022. FINDINGS: VASCULAR Aorta: There are moderate patchy calcific plaques without aneurysm, flow-limiting stenosis or dissection. Celiac: There are patchy calcific plaques of the celiac artery and its major branch arteries. Ostial calcific plaques cause 40% origin stenosis but there is no other focal stenosis, no dissection or aneurysm. There are extensive calcifications in the hepatic and splenic parenchymal small arteries. SMA: There are moderate calcific plaques, including circumferential calcifications distal to the vessel inflection point, but no flow-limiting major-vessel stenosis. Renals: There are calcific plaques, which continue to the renal hila but no flow-limiting or greater than 50% major vessel stenosis is seen. Both renal arteries are single. IMA: There are circumferential vessel calcifications. There is high-grade vessel origin stenosis but no other visible focal stenosis. Inflow: There are moderate calcifications in the common iliac arteries but no hemodynamically significant or greater than 50% stenosis is seen. There are circumferential calcifications in the internal iliac arteries but no flow-limiting stenosis. There is mild scattered calcification in the external iliac arteries without significant stenosis. Proximal Outflow: There are calcifications in the common femoral and proximal superficial femoral arteries and of the visualized deep femoral arteries,  without flow-limiting stenosis. Veins: Patent.  The main portal vein is normal in caliber. Review of the MIP images confirms the above findings. NON-VASCULAR Lower chest: There is mild cardiomegaly. There is calcification in the right coronary artery. The intraventricular blood pool is low in density on the noncontrast scan consistent with anemia. Lung bases are clear. Right pleural effusion noted previously has cleared. Hepatobiliary: The liver is 18 cm length mildly steatotic with scattered calcified granulomas. There is no mass enhancement. Some images suggest capsular lobulation which could be due to early cirrhosis. There are stones in the gallbladder but no wall thickening or biliary prominence. Pancreas: Moderately atrophic without inflammatory changes, mass enhancement or ductal dilatation. Spleen: Mildly enlarged, with multiple calcified granulomas, unchanged in size at 15 cm AP. No mass is seen. Adrenals/Urinary Tract: There is no adrenal mass. There is mild bilateral renal volume loss likely vascular etiology. There is no mass enhancement. There are small right parapelvic cysts which are unchanged with no follow-up required. Extensive renovascular calcifications extend to the hila. There is no urinary stone or obstruction. There has no bladder thickening. There previously was air in the bladder which is not seen today. Stomach/Bowel: There is increased antral gastric fold thickening without inflammatory changes or penetrating ulcer. The unopacified small bowel is unremarkable and there is a normal appendix. There is moderate stool retention in the ascending and transverse colon. There are colonic diverticula most advanced in the sigmoid segment. There is wall thickening and stranding along the distal sigmoid colon and rectum consistent with proctocolitis, but no arterial or venous contrast leakage is seen in the lumen. Lymphatic: No adenopathy is seen. Reproductive: Uterus and bilateral adnexa are  unremarkable. Other: There is mild body wall anasarca but less pronounced than on 111-21-23 There is minimal ascites in the posterior deep pelvis but the abdominal ascites noted previously has cleared. There is no incarcerated hernia. No bowel pneumatosis is seen and no free air or free hemorrhage. Musculoskeletal: Advanced marginal osteophytosis thoracic and lumbar spine. 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 Mild osteopenia. IMPRESSION: 1. No evidence of arterial or venous contrast  leakage into the bowel lumen. 2. Extensive vascular calcifications, particularly the small intraorgan parenchymal branch arteries including renovascular. 3. 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 4. Major branch arteries otherwise without flow-limiting (or greater than 50%) stenoses. 5. Cardiomegaly with calcific CAD. 6. Anemia. 7. Proctocolitis involving the distal sigmoid colon and rectum, likely infectious or inflammatory. No pneumatosis or portal venous gas. Ischemic etiology not strictly excluded. 8. Diverticulosis without evidence of diverticulitis.  Constipation. 9. Mild hepatic steatosis with possible early cirrhosis. Mild splenomegaly. 10. Cholelithiasis. 11. Mild body wall anasarca but less pronounced than on 04-22-2022. 12. Minimal ascites in the posterior deep pelvis. 13. Osteopenia and degenerative change. 14. Increased antral gastric fold thickening which could be due to nondistention or gastritis. 15. Aortic atherosclerosis. Aortic Atherosclerosis (ICD10-I70.0). Electronically Signed   By: Telford Nab M.D.   On: 05/23/2022 01:47     Discharge Exam: Vitals:   05/29/22 0318 05/29/22 0815  BP: (!) 118/55 122/64  Pulse: 94 95  Resp: 18 18  Temp: 98.4 F (36.9 C) 98.4 F (36.9 C)  SpO2: 95% 98%   Vitals:   05/28/22 1556 05/28/22 2019 05/29/22 0318 05/29/22 0815  BP: 118/88 (!) 110/46 (!) 118/55 122/64  Pulse: 100 97 94 95  Resp: _0 Temp: 98 F (36.7 C) 98.2 F (36.8 C) 98.4 F (36.9 C) 98.4 F (36.9 C)  TempSrc: Oral Oral Oral Oral  SpO2: 96% 97% 95% 98%  Weight:      Height:        General: Pt is alert, awake, not in acute distress Cardiovascular: RRR, S1/S2 +, no rubs, no gallops Respiratory: CTA bilaterally, no wheezing, no rhonchi Abdominal: Soft, NT, ND, bowel sounds + Extremities: no edema, no cyanosis    The results of significant diagnostics from this hospitalization (including imaging, microbiology, ancillary and laboratory) are listed below for reference.     Microbiology: Recent Results (from the past 240 hour(s))  MRSA Next Gen by PCR, Nasal     Status: None   Collection Time: 05/23/22  4:12 PM   Specimen: Nasal Mucosa; Nasal Swab  Result Value Ref Range Status   MRSA by PCR Next Gen NOT DETECTED NOT DETECTED Final    Comment: (NOTE) The GeneXpert MRSA Assay (FDA approved for NASAL specimens only), is one component of a comprehensive MRSA colonization surveillance program. It is not intended to diagnose MRSA infection nor to guide or monitor treatment for MRSA infections. Test performance is not FDA approved in patients less than 65 years old. Performed at [facility], [place] [address], [place], [place] [address]   Gastrointestinal Panel by PCR , Stool     Status: None   Collection Time: 05/24/22 11:37 AM   Specimen: Stool  Result Value Ref Range Status   Campylobacter species NOT DETECTED NOT DETECTED Final   Plesimonas shigelloides NOT DETECTED NOT DETECTED Final   Salmonella species NOT DETECTED NOT DETECTED Final   Yersinia enterocolitica NOT DETECTED NOT DETECTED Final   Vibrio species NOT DETECTED NOT DETECTED Final   Vibrio cholerae NOT DETECTED NOT DETECTED Final   Enteroaggregative E coli (EAEC) NOT DETECTED NOT DETECTED Final   Enteropathogenic E coli (EPEC) NOT DETECTED NOT DETECTED Final   Enterotoxigenic E coli (ETEC) NOT DETECTED NOT DETECTED Final    Shiga like toxin producing E coli (STEC) NOT DETECTED NOT DETECTED Final   Shigella/Enteroinvasive E coli (EIEC) NOT DETECTED NOT DETECTED Final  Cryptosporidium NOT DETECTED NOT DETECTED Final   Cyclospora cayetanensis NOT DETECTED NOT DETECTED Final   Entamoeba histolytica NOT DETECTED NOT DETECTED Final   Giardia lamblia NOT DETECTED NOT DETECTED Final   Adenovirus F40/41 NOT DETECTED NOT DETECTED Final   Astrovirus NOT DETECTED NOT DETECTED Final   Norovirus GI/GII NOT DETECTED NOT DETECTED Final   Rotavirus A NOT DETECTED NOT DETECTED Final   Sapovirus (I, II, IV, and V) NOT DETECTED NOT DETECTED Final    Comment: Performed at [facility], [address] [place], [place] [address]     Labs: BNP (last 3 results) Recent Labs    01/29/22 1147 02/01/22 0615 02/25/22 1051  BNP 891.1* 451.5* 3,664.4*   Basic Metabolic Panel: Recent Labs  Lab 05/24/22 0409 05/24/22 1710 05/25/22 0558 05/26/22 0407 05/27/22 0321 05/28/22 0549 05/29/22 0630  NA 131*  --  130* 133* 131* 131*  --   K 3.2*  --  3.8 3.3* 4.4 3.6  --   CL 98  --  97* 97* 94* 95*  --   CO2 26  --  26 27 15* 26  --   GLUCOSE 174*  --  222* 159* 407* 219*  --   BUN 40*  --  50* 32* 43* 24*  --   CREATININE 3.24*  --  3.67* 2.92* 4.19* 2.75*  --   CALCIUM 8.1*  --  7.8* 8.2* 8.4* 8.1*  --   MG  --   --   --  2.1 2.0 1.7 1.6*  PHOS  --  3.3  --  3.4 5.1* 3.4 3.9   Liver Function Tests: Recent Labs  Lab 05/22/22 2220  AST 25  ALT 9  ALKPHOS 110  BILITOT <0.1*  PROT 5.9*  ALBUMIN 2.1*   No results for input(s): "LIPASE", "AMYLASE" in the last 168 hours. No results for input(s): "AMMONIA" in the last 168 hours. CBC: Recent Labs  Lab 05/22/22 2220 05/22/22 2253 05/23/22 0424 05/23/22 1811 05/25/22 1640 05/26/22 0410 05/27/22 0321 05/28/22 0549 05/29/22 0630  WBC 9.4  --  9.8   < > 14.4* 13.2* 16.2* 9.8 12.4*  NEUTROABS 7.6  --  7.8*  --   --   --   --   --   --   HGB 9.0*   < > 7.8*    < > 10.3* 9.7* 9.6* 9.1* 8.5*  HCT 32.1*   < > 25.9*   < > 31.6* 30.1* 31.5* 29.9* 27.4*  MCV 85.1  --  85.5   < > 83.4 83.8 89.0 89.3 89.0  PLT 265  --  258   < > 246 247 233 188 199   < > = values in this interval not displayed.   Cardiac Enzymes: No results for input(s): "CKTOTAL", "CKMB", "CKMBINDEX", "TROPONINI" in the last 168 hours. BNP: Invalid input(s): "POCBNP" CBG: Recent Labs  Lab 05/28/22 0731 05/28/22 1128 05/28/22 1554 05/28/22 2159 05/29/22 0815  GLUCAP 246* 248* 233* 319* 455*   D-Dimer No results for input(s): "DDIMER" in the last 72 hours. Hgb A1c No results for input(s): "HGBA1C" in the last 72 hours. Lipid Profile No results for input(s): "CHOL", "HDL", "LDLCALC", "TRIG", "CHOLHDL", "LDLDIRECT" in the last 72 hours. Thyroid function studies No results for input(s): "TSH", "T4TOTAL", "T3FREE", "THYROIDAB" in the last 72 hours.  Invalid input(s): "FREET3" Anemia work up No results for input(s): "VITAMINB12", "FOLATE", "FERRITIN", "TIBC", "IRON", "RETICCTPCT" in the last 72 hours. Urinalysis    Component Value  Date/Time   COLORURINE YELLOW 02/05/2022 1520   APPEARANCEUR CLOUDY (A) 02/05/2022 1520   LABSPEC 1.010 02/05/2022 1520   PHURINE 5.0 02/05/2022 1520   GLUCOSEU >=500 (A) 02/05/2022 1520   HGBUR SMALL (A) 02/05/2022 1520   BILIRUBINUR NEGATIVE 02/05/2022 1520   BILIRUBINUR negative 06/05/2020 1334   KETONESUR NEGATIVE 02/05/2022 1520   PROTEINUR 30 (A) 02/05/2022 1520   UROBILINOGEN 0.2 06/05/2020 1334   UROBILINOGEN 0.2 04/01/2014 1659   NITRITE NEGATIVE 02/05/2022 1520   LEUKOCYTESUR LARGE (A) 02/05/2022 1520   Sepsis Labs Recent Labs  Lab 05/26/22 0410 05/27/22 0321 05/28/22 0549 05/29/22 0630  WBC 13.2* 16.2* 9.8 12.4*   Microbiology Recent Results (from the past 240 hour(s))  MRSA Next Gen by PCR, Nasal     Status: None   Collection Time: 05/23/22  4:12 PM   Specimen: Nasal Mucosa; Nasal Swab  Result Value Ref Range Status    MRSA by PCR Next Gen NOT DETECTED NOT DETECTED Final    Comment: (NOTE) The GeneXpert MRSA Assay (FDA approved for NASAL specimens only), is one component of a comprehensive MRSA colonization surveillance program. It is not intended to diagnose MRSA infection nor to guide or monitor treatment for MRSA infections. Test performance is not FDA approved in patients less than 34 years old. Performed at [facility], [place] [address], [place], [place] [address]   Gastrointestinal Panel by PCR , Stool     Status: None   Collection Time: 05/24/22 11:37 AM   Specimen: Stool  Result Value Ref Range Status   Campylobacter species NOT DETECTED NOT DETECTED Final   Plesimonas shigelloides NOT DETECTED NOT DETECTED Final   Salmonella species NOT DETECTED NOT DETECTED Final   Yersinia enterocolitica NOT DETECTED NOT DETECTED Final   Vibrio species NOT DETECTED NOT DETECTED Final   Vibrio cholerae NOT DETECTED NOT DETECTED Final   Enteroaggregative E coli (EAEC) NOT DETECTED NOT DETECTED Final   Enteropathogenic E coli (EPEC) NOT DETECTED NOT DETECTED Final   Enterotoxigenic E coli (ETEC) NOT DETECTED NOT DETECTED Final   Shiga like toxin producing E coli (STEC) NOT DETECTED NOT DETECTED Final   Shigella/Enteroinvasive E coli (EIEC) NOT DETECTED NOT DETECTED Final   Cryptosporidium NOT DETECTED NOT DETECTED Final   Cyclospora cayetanensis NOT DETECTED NOT DETECTED Final   Entamoeba histolytica NOT DETECTED NOT DETECTED Final   Giardia lamblia NOT DETECTED NOT DETECTED Final   Adenovirus F40/41 NOT DETECTED NOT DETECTED Final   Astrovirus NOT DETECTED NOT DETECTED Final   Norovirus GI/GII NOT DETECTED NOT DETECTED Final   Rotavirus A NOT DETECTED NOT DETECTED Final   Sapovirus (I, II, IV, and V) NOT DETECTED NOT DETECTED Final    Comment: Performed at [facility], [address], [place], [place] [address]     Time coordinating discharge: Over 30 minutes  SIGNED:   Darliss Cheney, MD  [facility] 05/29/2022, 9:45 AM *Please note that this is a verbal dictation therefore any spelling or grammatical errors are due to the "[employer] One" system interpretation. If 7PM-7AM, please contact night-coverage [email or web address]

## 2022-05-29 NOTE — Telephone Encounter (Signed)
Call received from Merrily Pew at [facility] [place] [phone]). States patient was discharged today and questioned whether patient needs to be taking dificid. Upon chart review by Ms. Owens Shark, she stated that Dr. Doristine Bosworth (hospitalist) had prescribed the medication. Relayed to Ms. Owens Shark that she should contact the prescribing physician with additional questions; she verbalized understanding.  Binnie Kand, RN

## 2022-05-29 NOTE — Consult Note (Addendum)
WOC Nurse Consult Note: Consult requested for loose right middle fingernail.  Performed remotely after review of progress notes.  There is no topical treatment which will prevent the loose fingernail from eventually falling off and exposing the skin underneath the nailbed.  Topical treatment orders provided for bedside nurses to perform as follows to prevent infection and promote moist healing: Apply Bactroban to loose fingernail/nailbed Q day and cover with foam dressing or bandaid. Please re-consult if further assistance is needed.  Thank-you,  Julien Girt MSN, [place], [place], [place], [place]

## 2022-05-29 NOTE — Progress Notes (Signed)
Patient CBG 455 this morning. Dr. Doristine Bosworth made aware. Per Dr. Doristine Bosworth, to give 40 units of insulin semlgee and 11 units of novolog.

## 2022-05-29 NOTE — Progress Notes (Addendum)
RN made aware by patient her nail bed on her right middle finger is starting to come off and is requesting a bandage.  When asked how her nail bed started peeling off, she states "I don't know how it happened". Nail bed cleanesed with NS, xerform applied, Bandage applied on top. Wound consult entered.

## 2022-05-30 ENCOUNTER — Telehealth: Payer: Self-pay | Admitting: Orthopedic Surgery

## 2022-05-30 DIAGNOSIS — I4891 Unspecified atrial fibrillation: Secondary | ICD-10-CM | POA: Diagnosis not present

## 2022-05-30 DIAGNOSIS — N2581 Secondary hyperparathyroidism of renal origin: Secondary | ICD-10-CM | POA: Diagnosis not present

## 2022-05-30 DIAGNOSIS — D689 Coagulation defect, unspecified: Secondary | ICD-10-CM | POA: Diagnosis not present

## 2022-05-30 DIAGNOSIS — Z992 Dependence on renal dialysis: Secondary | ICD-10-CM | POA: Diagnosis not present

## 2022-05-30 DIAGNOSIS — J9611 Chronic respiratory failure with hypoxia: Secondary | ICD-10-CM | POA: Diagnosis not present

## 2022-05-30 DIAGNOSIS — N186 End stage renal disease: Secondary | ICD-10-CM | POA: Diagnosis not present

## 2022-05-30 DIAGNOSIS — R197 Diarrhea, unspecified: Secondary | ICD-10-CM | POA: Diagnosis not present

## 2022-05-30 NOTE — Telephone Encounter (Signed)
Call [phone] [facility]

## 2022-05-30 NOTE — Telephone Encounter (Signed)
Patient nurse called in stating patient needs appt rescheduled if possible same day just in the morning being she has dialysis and will not be able to make it in time

## 2022-06-02 ENCOUNTER — Encounter: Payer: PPO | Admitting: Orthopedic Surgery

## 2022-06-02 ENCOUNTER — Ambulatory Visit: Payer: HMO | Admitting: Family Medicine

## 2022-06-02 DIAGNOSIS — N2581 Secondary hyperparathyroidism of renal origin: Secondary | ICD-10-CM | POA: Diagnosis not present

## 2022-06-02 DIAGNOSIS — D62 Acute posthemorrhagic anemia: Secondary | ICD-10-CM | POA: Diagnosis not present

## 2022-06-02 DIAGNOSIS — N186 End stage renal disease: Secondary | ICD-10-CM | POA: Diagnosis not present

## 2022-06-02 DIAGNOSIS — K626 Ulcer of anus and rectum: Secondary | ICD-10-CM | POA: Diagnosis not present

## 2022-06-02 DIAGNOSIS — R2689 Other abnormalities of gait and mobility: Secondary | ICD-10-CM | POA: Diagnosis not present

## 2022-06-02 DIAGNOSIS — D689 Coagulation defect, unspecified: Secondary | ICD-10-CM | POA: Diagnosis not present

## 2022-06-02 DIAGNOSIS — K922 Gastrointestinal hemorrhage, unspecified: Secondary | ICD-10-CM | POA: Diagnosis not present

## 2022-06-02 DIAGNOSIS — E1165 Type 2 diabetes mellitus with hyperglycemia: Secondary | ICD-10-CM | POA: Diagnosis not present

## 2022-06-02 DIAGNOSIS — D631 Anemia in chronic kidney disease: Secondary | ICD-10-CM | POA: Diagnosis not present

## 2022-06-02 DIAGNOSIS — J9611 Chronic respiratory failure with hypoxia: Secondary | ICD-10-CM | POA: Diagnosis not present

## 2022-06-02 DIAGNOSIS — Z992 Dependence on renal dialysis: Secondary | ICD-10-CM | POA: Diagnosis not present

## 2022-06-02 DIAGNOSIS — E1142 Type 2 diabetes mellitus with diabetic polyneuropathy: Secondary | ICD-10-CM | POA: Diagnosis not present

## 2022-06-02 DIAGNOSIS — M6281 Muscle weakness (generalized): Secondary | ICD-10-CM | POA: Diagnosis not present

## 2022-06-02 DIAGNOSIS — I5042 Chronic combined systolic (congestive) and diastolic (congestive) heart failure: Secondary | ICD-10-CM | POA: Diagnosis not present

## 2022-06-02 DIAGNOSIS — I959 Hypotension, unspecified: Secondary | ICD-10-CM | POA: Diagnosis not present

## 2022-06-02 DIAGNOSIS — I120 Hypertensive chronic kidney disease with stage 5 chronic kidney disease or end stage renal disease: Secondary | ICD-10-CM | POA: Diagnosis not present

## 2022-06-02 NOTE — Telephone Encounter (Signed)
Called and sw Susan [name] pt is sch for tomorrow morning with Junie Panning

## 2022-06-03 ENCOUNTER — Encounter: Payer: Self-pay | Admitting: Family

## 2022-06-03 ENCOUNTER — Ambulatory Visit (INDEPENDENT_AMBULATORY_CARE_PROVIDER_SITE_OTHER): Payer: PPO | Admitting: Family

## 2022-06-03 DIAGNOSIS — S88111A Complete traumatic amputation at level between knee and ankle, right lower leg, initial encounter: Secondary | ICD-10-CM

## 2022-06-03 DIAGNOSIS — Z89511 Acquired absence of right leg below knee: Secondary | ICD-10-CM

## 2022-06-03 DIAGNOSIS — T8781 Dehiscence of amputation stump: Secondary | ICD-10-CM

## 2022-06-03 NOTE — Progress Notes (Signed)
Office Visit Note   Patient: Susan [name]           Date of Birth: [birth year]           MRN: [id number] Visit Date: 06/03/2022              Requested by: [name], MD [address] [place],  [place] [address] PCP: [name], MD  Chief Complaint  Patient presents with   Right Leg - Routine Post Op    05/02/2022 right BKA       HPI: Patient is a 72 year old woman who presents 4 weeks status post right below-knee amputation.  Patient is on dialysis Monday Wednesday Friday.  Assessment & Plan: Visit Diagnoses:  1. Below-knee amputation of right lower extremity ([place])   2. Dehiscence of amputation stump [facility])     Plan: Plan for revision right below-knee amputation on Friday.  Plan for outpatient surgery patient will return to skilled nursing.  Patient will need dialysis on Saturday.  Follow-Up Instructions: Return in about 2 weeks (around 06/17/2022).   Ortho Exam  Patient is alert, oriented, no adenopathy, well-dressed, normal affect, normal respiratory effort. Examination patient's venous ulcer on the left leg is healed.  Right leg shows a large black eschar with wound dehiscence of the transtibial amputation.  Imaging: No results found.   Labs: Lab Results  Component Value Date   HGBA1C 8.9 (H) 05/02/2022   HGBA1C >15.5 (H) 01/20/2022   HGBA1C >15.5 (H) 01/19/2022   CRP 0.7 08/22/2020   CRP 1.5 (H) 03/13/2020   CRP 1.9 (H) 03/12/2020   LABURIC 4.7 08/09/2020   LABURIC 9.1 (H) 10/12/2018   LABURIC 9.0 (H) 03/06/2018   REPTSTATUS 05/10/2022 FINAL 05/05/2022   GRAMSTAIN  05/05/2022    FEW WBC PRESENT, PREDOMINANTLY PMN NO ORGANISMS SEEN    CULT  05/05/2022    RARE SERRATIA MARCESCENS NO ANAEROBES ISOLATED Performed at [facility], [place] [address] [place], [place] [address]    LABORGA SERRATIA MARCESCENS 05/05/2022     Lab Results  Component Value Date   ALBUMIN 2.1 (L) 05/22/2022   ALBUMIN 1.9 (L) 05/11/2022   ALBUMIN 1.8  (L) 05/08/2022   PREALBUMIN 13 (L) 05/02/2022    Lab Results  Component Value Date   MG 1.6 (L) 05/29/2022   MG 1.7 05/28/2022   MG 2.0 05/27/2022   Lab Results  Component Value Date   VD25OH 42.14 05/02/2022    Lab Results  Component Value Date   PREALBUMIN 13 (L) 05/02/2022      Latest Ref Rng & Units 05/29/2022    6:30 AM 05/28/2022    5:49 AM 05/27/2022    3:21 AM  CBC EXTENDED  WBC 4.0 - 10.5 K/uL 12.4  9.8  16.2   RBC 3.87 - 5.11 MIL/uL 3.08  3.35  3.54   Hemoglobin 12.0 - 15.0 g/dL 8.5  9.1  9.6   HCT 36.0 - 46.0 % 27.4  29.9  31.5   Platelets 150 - 400 K/uL 199  188  233      There is no height or weight on file to calculate BMI.  Orders:  No orders of the defined types were placed in this encounter.  No orders of the defined types were placed in this encounter.    Procedures: No procedures performed  Clinical Data: No additional findings.  ROS:  All other systems negative, except as noted in the HPI. Review of Systems  Objective: Vital Signs: There were no vitals taken for this visit.  Specialty Comments:  No specialty comments available.  PMFS History: Patient Active Problem List   Diagnosis Date Noted   Rectal ulcer 05/27/2022   ESRD on dialysis [facility]) 05/27/2022   GI bleed 05/23/2022   Difficult intravenous access 05/23/2022   Gangrene of right foot ([place]) 05/02/2022   S/P BKA (below knee amputation) unilateral, right ([place]) 05/02/2022   Unspecified protein-calorie malnutrition ([name]) 04/15/2022   Secondary hyperparathyroidism of renal origin ([place]) 04/14/2022   Coagulation defect, unspecified ([place]) 04/09/2022   Acquired absence of other left toe(s) ([place]) 04/07/2022   Allergy, unspecified, initial encounter 04/07/2022   Dependence on renal dialysis ([place]) 04/07/2022   Gout due to renal impairment, unspecified site 04/07/2022   Hypertensive heart and chronic kidney disease with heart failure and with stage 5 chronic kidney disease, or end stage  renal disease ([place]) 04/07/2022   Personal history of transient ischemic attack (TIA), and cerebral infarction without residual deficits 04/07/2022   Renal osteodystrophy 04/07/2022   Venous stasis ulcer of right calf ([place]) 03/31/2022   Fistula, colovaginal 03/26/2022   Diarrhea 03/26/2022   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   Sepsis without acute organ dysfunction ([place])    Bacteremia    Acute pancreatitis 02/01/2022   Abdominal pain 02/01/2022   SIRS (systemic inflammatory response syndrome) ([place]) 02/01/2022   Transaminitis 02/01/2022   History of anemia due to chronic kidney disease 02/01/2022   Paroxysmal atrial fibrillation ([place]) 02/01/2022   DKA (diabetic ketoacidosis) ([place]) 01/14/2022   NSTEMI (non-ST elevated myocardial infarction) ([place]) 03/05/2021   Acute renal failure superimposed on stage 4 chronic kidney disease ([place]) 08/22/2020   Hypoalbuminemia 05/25/2020   GERD (gastroesophageal reflux disease) 05/25/2020   Pressure injury of skin 05/17/2020   Type 2 diabetes mellitus with diabetic polyneuropathy, with long-term current use of insulin ([place]) 03/07/2020   Obesity, Class III, BMI 40-49.9 (morbid obesity) ([place]) 03/07/2020   Common bile duct (CBD) obstruction 05/28/2019   Benign neoplasm of ascending colon    Benign neoplasm of transverse colon    Benign neoplasm of descending colon    Benign neoplasm of sigmoid colon    Gastric polyps    Hyperkalemia 03/11/2019   Prolonged QT interval 03/11/2019   Acute blood loss anemia 03/11/2019   Onychomycosis 06/21/2018   Osteomyelitis of second toe of right foot ([place])    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 (HCC)    PVD (peripheral vascular disease) ([place]) 10/26/2017   E-coli UTI 07/27/2017   Hypothyroidism 07/27/2017   AKI (acute kidney injury) ([place])    [place] (pulmonary artery hypertension) ([place])    Impaired ambulation 07/19/2017   Nausea & vomiting 07/15/2017   Leg cramps 02/27/2017   Peripheral edema  01/12/2017   Diabetic neuropathy ([place]) 11/12/2016   CKD (chronic kidney disease), stage IV ([place]) 10/24/2015   Anemia 10/03/2015   Generalized anxiety disorder 10/03/2015   Insomnia 10/03/2015   Hyperglycemia due to diabetes mellitus ([place]) 06/07/2015   Chronic diastolic CHF (congestive heart failure) ([place]) 06/07/2015   Non compliance with medical treatment 04/17/2014   Rotator cuff tear 03/14/2014   Class 3 obesity ([place]) 09/23/2013   Chronic HFrEF (heart failure with reduced ejection fraction) ([place]) 06/03/2013   Hypotension 12/25/2012   Urinary incontinence    MDD (major depressive disorder) 11/12/2010   RBBB (right bundle branch block)    Coronary artery disease    Hyperlipemia 01/22/2009   Essential hypertension 01/22/2009  Past Medical History:  Diagnosis Date   Acute MI ([place]) 1999; 2007; 03/05/21   Anemia    hx   Anginal pain (HCC)    Anxiety    ARF (acute renal failure) ([place]) 06/2017   [place] [facility]   Arthritis    "generalized" (03/15/2014)   CAD (coronary artery disease)    MI in 2000 - MI  2007 - treated bare metal stent (no nuclear since then as 9/11)   Carotid artery disease (HCC)    CHF (congestive heart failure) ([place])    HFrEF 03/06/21   Chronic diastolic heart failure ([place])    a) ECHO (08/2013) EF 55-60% and RV function nl b) RHC (08/2013) RA 4, RV 30/5/7, PA 25/10 (16), PCWP 7, Fick CO/CI 6.3/2.7, PVR 1.5 WU, PA 61 and 66%   Daily headache    "~ every other day; since I fell in June" (03/15/2014)   Depression    Dyslipidemia    Dyspnea    uses oxygen qhs via [place]   ESRD (end stage renal disease) ([place])    Dialysis on Tues Thurs Sat   Exertional shortness of breath    History of kidney stones    HTN (hypertension)    Hypothyroidism    Neuropathy    Obesity    Osteoarthritis    PAF (paroxysmal atrial fibrillation) (HCC)    Peripheral neuropathy    bilateral feet/hands   PONV (postoperative nausea and vomiting)    RBBB (right bundle branch block)     Old   Stroke ([place])    mini strokes   Syncope    likely due to low blood sugar   Tachycardia    Sinus tachycardia   Type II diabetes mellitus (HCC)    Type II, Lamar Sprinkles libre left upper arm. patient has omnipod insulin pump with Novolin R Insulin   Urinary incontinence    Venous insufficiency     Family History  Problem Relation Age of Onset   Heart attack Mother 84    Past Surgical History:  Procedure Laterality Date   ABDOMINAL HYSTERECTOMY  1980's   AMPUTATION Right 02/24/2018   Procedure: RIGHT FOOT GREAT TOE AND 2ND TOE AMPUTATION;  Surgeon: Newt Minion, MD;  Location: [place];  Service: Orthopedics;  Laterality: Right;   AMPUTATION Right 04/30/2018   Procedure: RIGHT TRANSMETATARSAL AMPUTATION;  Surgeon: Newt Minion, MD;  Location: [place];  Service: Orthopedics;  Laterality: Right;   AMPUTATION Right 05/02/2022   Procedure: RIGHT BELOW KNEE AMPUTATION;  Surgeon: Newt Minion, MD;  Location: [place];  Service: Orthopedics;  Laterality: Right;   AV FISTULA PLACEMENT Left 04/02/2022   Procedure: LEFT ARM ARTERIOVENOUS (AV) FISTULA CREATION;  Surgeon: Serafina Mitchell, MD;  Location: [place];  Service: Vascular;  Laterality: Left;  PERIPHERAL NERVE BLOCK   BIOPSY  05/27/2020   Procedure: BIOPSY;  Surgeon: [name], DO;  [facility];  Service: Endoscopy;;   CATARACT EXTRACTION, BILATERAL Bilateral ?2013   COLONOSCOPY W/ POLYPECTOMY     COLONOSCOPY WITH PROPOFOL N/A 03/13/2019   Procedure: COLONOSCOPY WITH PROPOFOL;  Surgeon: Jerene Bears, MD;  Location: [place];  Service: Gastroenterology;  Laterality: N/A;   CORONARY ANGIOPLASTY WITH STENT PLACEMENT  1999; 2007   "1 + 1"   ERCP N/A 02/03/2022   Procedure: ENDOSCOPIC RETROGRADE CHOLANGIOPANCREATOGRAPHY (ERCP);  Surgeon: Carol Ada, MD;  Location: [place];  Service: Gastroenterology;  Laterality: N/A;   ESOPHAGOGASTRODUODENOSCOPY (EGD) WITH PROPOFOL N/A 03/13/2019  Procedure:  ESOPHAGOGASTRODUODENOSCOPY (EGD) WITH PROPOFOL;  Surgeon: Jerene Bears, MD;  [facility];  Service: Gastroenterology;  Laterality: N/A;   ESOPHAGOGASTRODUODENOSCOPY (EGD) WITH PROPOFOL N/A 05/27/2020   Procedure: ESOPHAGOGASTRODUODENOSCOPY (EGD) WITH PROPOFOL;  Surgeon: [name], DO;  [facility];  Service: Endoscopy;  Laterality: N/A;   EYE SURGERY Bilateral    [place]   FLEXIBLE SIGMOIDOSCOPY N/A 05/23/2022   Procedure: FLEXIBLE SIGMOIDOSCOPY;  Surgeon: Carol Ada, MD;  Location: [place];  Service: Gastroenterology;  Laterality: N/A;   FLEXIBLE SIGMOIDOSCOPY N/A 05/24/2022   Procedure: FLEXIBLE SIGMOIDOSCOPY;  Surgeon: Sharyn Creamer, MD;  Location: [place];  Service: Gastroenterology;  Laterality: N/A;   HEMOSTASIS CLIP PLACEMENT  03/13/2019   Procedure: HEMOSTASIS CLIP PLACEMENT;  Surgeon: Jerene Bears, MD;  Location: [facility] ENDOSCOPY;  Service: Gastroenterology;;   HEMOSTASIS CLIP PLACEMENT  05/23/2022   Procedure: HEMOSTASIS CLIP PLACEMENT;  Surgeon: Carol Ada, MD;  Location: [place];  Service: Gastroenterology;;   HEMOSTASIS CONTROL  05/24/2022   Procedure: HEMOSTASIS CONTROL;  Surgeon: Sharyn Creamer, MD;  Location: [place];  Service: Gastroenterology;;   HOT HEMOSTASIS N/A 05/23/2022   Procedure: HOT HEMOSTASIS (ARGON PLASMA COAGULATION/BICAP);  Surgeon: Carol Ada, MD;  Location: [place];  Service: Gastroenterology;  Laterality: N/A;   I & D EXTREMITY Left 05/05/2022   Procedure: IRRIGATION AND DEBRIDEMENT LEFT ARM AV FISTULA;  Surgeon: Marty Heck, MD;  Location: [place];  Service: Vascular;  Laterality: Left;   INSERTION OF DIALYSIS CATHETER Right 04/02/2022   Procedure: INSERTION OF TUNNELED DIALYSIS CATHETER;  Surgeon: Serafina Mitchell, MD;  Location: [name];  Service: Vascular;  Laterality: Right;   KNEE ARTHROSCOPY Left 10/25/2006   POLYPECTOMY  03/13/2019   Procedure: POLYPECTOMY;  Surgeon: Jerene Bears, MD;   Location: [place] [facility]OSCOPY;  Service: Gastroenterology;;   REMOVAL OF STONES  02/03/2022   Procedure: REMOVAL OF STONES;  Surgeon: Carol Ada, MD;  Location: [place];  Service: Gastroenterology;;   RIGHT HEART CATH N/A 07/24/2017   Procedure: RIGHT HEART CATH;  Surgeon: Jolaine Artist, MD;  Location: [place] [facility] LAB;  Service: Cardiovascular;  Laterality: N/A;   RIGHT HEART CATHETERIZATION N/A 09/22/2013   Procedure: RIGHT HEART CATH;  Surgeon: Jolaine Artist, MD;  Location: [facility] CATH LAB;  Service: Cardiovascular;  Laterality: N/A;   SHOULDER ARTHROSCOPY WITH OPEN ROTATOR CUFF REPAIR Right 03/14/2014   Procedure: RIGHT SHOULDER ARTHROSCOPY WITH BICEPS RELEASE, OPEN SUBSCAPULA REPAIR, OPEN SUPRASPINATUS REPAIR.;  Surgeon: Meredith Pel, MD;  Location: [place];  Service: Orthopedics;  Laterality: Right;   SPHINCTEROTOMY  02/03/2022   Procedure: SPHINCTEROTOMY;  Surgeon: Carol Ada, MD;  Location: [place];  Service: Gastroenterology;;   TEE WITHOUT CARDIOVERSION N/A 02/04/2022   Procedure: TRANSESOPHAGEAL ECHOCARDIOGRAM (TEE);  Surgeon: Jolaine Artist, MD;  Location: [facility] ENDOSCOPY;  Service: Cardiovascular;  Laterality: N/A;   TOE AMPUTATION Right 02/24/2018   GREAT TOE AND 2ND TOE AMPUTATION   TUBAL LIGATION  1970's   Social History   Occupational History    Employer: UNEMPLOYED  Tobacco Use   Smoking status: Former    Packs/day: 3.00    Years: 32.00    Total pack years: 96.00    Types: Cigarettes    Quit date: 10/24/1997    Years since quitting: 24.6   Smokeless tobacco: Never  Vaping Use   Vaping Use: Never used  Substance and Sexual Activity   Alcohol use: Not Currently    Comment: "might have  2-3 daiquiris in the summer"   Drug use: No   Sexual activity: Not Currently    Birth control/protection: Surgical    Comment: Hysterectomy

## 2022-06-05 ENCOUNTER — Emergency Department (HOSPITAL_COMMUNITY): Payer: PPO

## 2022-06-05 ENCOUNTER — Inpatient Hospital Stay (HOSPITAL_COMMUNITY)
Admission: EM | Admit: 2022-06-05 | Discharge: 2022-06-17 | DRG: 981 | Disposition: A | Discharge status: Skilled Nursing Facility | Payer: PPO | Source: Skilled Nursing Facility | Attending: Internal Medicine | Admitting: Internal Medicine

## 2022-06-05 ENCOUNTER — Telehealth: Payer: Self-pay | Admitting: Orthopedic Surgery

## 2022-06-05 ENCOUNTER — Other Ambulatory Visit: Payer: Self-pay

## 2022-06-05 ENCOUNTER — Encounter (HOSPITAL_COMMUNITY): Payer: Self-pay

## 2022-06-05 DIAGNOSIS — J9611 Chronic respiratory failure with hypoxia: Secondary | ICD-10-CM | POA: Diagnosis not present

## 2022-06-05 DIAGNOSIS — D638 Anemia in other chronic diseases classified elsewhere: Secondary | ICD-10-CM | POA: Diagnosis not present

## 2022-06-05 DIAGNOSIS — Z881 Allergy status to other antibiotic agents status: Secondary | ICD-10-CM

## 2022-06-05 DIAGNOSIS — R2681 Unsteadiness on feet: Secondary | ICD-10-CM | POA: Diagnosis not present

## 2022-06-05 DIAGNOSIS — R0602 Shortness of breath: Secondary | ICD-10-CM | POA: Diagnosis not present

## 2022-06-05 DIAGNOSIS — K626 Ulcer of anus and rectum: Secondary | ICD-10-CM | POA: Diagnosis not present

## 2022-06-05 DIAGNOSIS — R739 Hyperglycemia, unspecified: Secondary | ICD-10-CM | POA: Diagnosis not present

## 2022-06-05 DIAGNOSIS — F32A Depression, unspecified: Secondary | ICD-10-CM | POA: Diagnosis not present

## 2022-06-05 DIAGNOSIS — K5792 Diverticulitis of intestine, part unspecified, without perforation or abscess without bleeding: Secondary | ICD-10-CM | POA: Diagnosis not present

## 2022-06-05 DIAGNOSIS — Z4781 Encounter for orthopedic aftercare following surgical amputation: Secondary | ICD-10-CM | POA: Diagnosis not present

## 2022-06-05 DIAGNOSIS — R9431 Abnormal electrocardiogram [ECG] [EKG]: Secondary | ICD-10-CM | POA: Diagnosis not present

## 2022-06-05 DIAGNOSIS — Z9841 Cataract extraction status, right eye: Secondary | ICD-10-CM

## 2022-06-05 DIAGNOSIS — Z794 Long term (current) use of insulin: Secondary | ICD-10-CM

## 2022-06-05 DIAGNOSIS — E1142 Type 2 diabetes mellitus with diabetic polyneuropathy: Secondary | ICD-10-CM | POA: Diagnosis not present

## 2022-06-05 DIAGNOSIS — R2689 Other abnormalities of gait and mobility: Secondary | ICD-10-CM | POA: Diagnosis not present

## 2022-06-05 DIAGNOSIS — D7389 Other diseases of spleen: Secondary | ICD-10-CM | POA: Diagnosis not present

## 2022-06-05 DIAGNOSIS — Z7401 Bed confinement status: Secondary | ICD-10-CM | POA: Diagnosis not present

## 2022-06-05 DIAGNOSIS — I5020 Unspecified systolic (congestive) heart failure: Secondary | ICD-10-CM | POA: Diagnosis not present

## 2022-06-05 DIAGNOSIS — I25119 Atherosclerotic heart disease of native coronary artery with unspecified angina pectoris: Secondary | ICD-10-CM | POA: Diagnosis present

## 2022-06-05 DIAGNOSIS — M6281 Muscle weakness (generalized): Secondary | ICD-10-CM | POA: Diagnosis not present

## 2022-06-05 DIAGNOSIS — I69828 Other speech and language deficits following other cerebrovascular disease: Secondary | ICD-10-CM | POA: Diagnosis not present

## 2022-06-05 DIAGNOSIS — Z955 Presence of coronary angioplasty implant and graft: Secondary | ICD-10-CM

## 2022-06-05 DIAGNOSIS — F419 Anxiety disorder, unspecified: Secondary | ICD-10-CM | POA: Diagnosis present

## 2022-06-05 DIAGNOSIS — J9610 Chronic respiratory failure, unspecified whether with hypoxia or hypercapnia: Secondary | ICD-10-CM | POA: Diagnosis not present

## 2022-06-05 DIAGNOSIS — I499 Cardiac arrhythmia, unspecified: Secondary | ICD-10-CM | POA: Diagnosis not present

## 2022-06-05 DIAGNOSIS — Z87891 Personal history of nicotine dependence: Secondary | ICD-10-CM

## 2022-06-05 DIAGNOSIS — E1310 Other specified diabetes mellitus with ketoacidosis without coma: Secondary | ICD-10-CM | POA: Diagnosis not present

## 2022-06-05 DIAGNOSIS — R110 Nausea: Secondary | ICD-10-CM | POA: Diagnosis not present

## 2022-06-05 DIAGNOSIS — A0472 Enterocolitis due to Clostridium difficile, not specified as recurrent: Secondary | ICD-10-CM | POA: Diagnosis not present

## 2022-06-05 DIAGNOSIS — N186 End stage renal disease: Secondary | ICD-10-CM

## 2022-06-05 DIAGNOSIS — Z992 Dependence on renal dialysis: Secondary | ICD-10-CM | POA: Diagnosis not present

## 2022-06-05 DIAGNOSIS — I739 Peripheral vascular disease, unspecified: Secondary | ICD-10-CM | POA: Diagnosis not present

## 2022-06-05 DIAGNOSIS — D631 Anemia in chronic kidney disease: Secondary | ICD-10-CM | POA: Diagnosis present

## 2022-06-05 DIAGNOSIS — Z8619 Personal history of other infectious and parasitic diseases: Secondary | ICD-10-CM

## 2022-06-05 DIAGNOSIS — E1122 Type 2 diabetes mellitus with diabetic chronic kidney disease: Secondary | ICD-10-CM | POA: Diagnosis present

## 2022-06-05 DIAGNOSIS — I509 Heart failure, unspecified: Secondary | ICD-10-CM | POA: Diagnosis not present

## 2022-06-05 DIAGNOSIS — K5289 Other specified noninfective gastroenteritis and colitis: Secondary | ICD-10-CM | POA: Diagnosis present

## 2022-06-05 DIAGNOSIS — I5032 Chronic diastolic (congestive) heart failure: Secondary | ICD-10-CM | POA: Diagnosis present

## 2022-06-05 DIAGNOSIS — Z7901 Long term (current) use of anticoagulants: Secondary | ICD-10-CM

## 2022-06-05 DIAGNOSIS — J90 Pleural effusion, not elsewhere classified: Secondary | ICD-10-CM | POA: Diagnosis not present

## 2022-06-05 DIAGNOSIS — I132 Hypertensive heart and chronic kidney disease with heart failure and with stage 5 chronic kidney disease, or end stage renal disease: Secondary | ICD-10-CM | POA: Diagnosis present

## 2022-06-05 DIAGNOSIS — E785 Hyperlipidemia, unspecified: Secondary | ICD-10-CM | POA: Diagnosis present

## 2022-06-05 DIAGNOSIS — E871 Hypo-osmolality and hyponatremia: Secondary | ICD-10-CM | POA: Diagnosis not present

## 2022-06-05 DIAGNOSIS — Z885 Allergy status to narcotic agent status: Secondary | ICD-10-CM

## 2022-06-05 DIAGNOSIS — Z1152 Encounter for screening for COVID-19: Secondary | ICD-10-CM | POA: Diagnosis not present

## 2022-06-05 DIAGNOSIS — T8781 Dehiscence of amputation stump: Secondary | ICD-10-CM | POA: Diagnosis present

## 2022-06-05 DIAGNOSIS — T8131XA Disruption of external operation (surgical) wound, not elsewhere classified, initial encounter: Secondary | ICD-10-CM | POA: Diagnosis not present

## 2022-06-05 DIAGNOSIS — I4510 Unspecified right bundle-branch block: Secondary | ICD-10-CM | POA: Diagnosis present

## 2022-06-05 DIAGNOSIS — J9811 Atelectasis: Secondary | ICD-10-CM | POA: Diagnosis not present

## 2022-06-05 DIAGNOSIS — Z89511 Acquired absence of right leg below knee: Secondary | ICD-10-CM | POA: Diagnosis not present

## 2022-06-05 DIAGNOSIS — M79604 Pain in right leg: Secondary | ICD-10-CM | POA: Diagnosis not present

## 2022-06-05 DIAGNOSIS — E039 Hypothyroidism, unspecified: Secondary | ICD-10-CM | POA: Diagnosis present

## 2022-06-05 DIAGNOSIS — N250 Renal osteodystrophy: Secondary | ICD-10-CM | POA: Diagnosis not present

## 2022-06-05 DIAGNOSIS — T8130XA Disruption of wound, unspecified, initial encounter: Secondary | ICD-10-CM | POA: Diagnosis not present

## 2022-06-05 DIAGNOSIS — S301XXA Contusion of abdominal wall, initial encounter: Secondary | ICD-10-CM | POA: Diagnosis not present

## 2022-06-05 DIAGNOSIS — R1084 Generalized abdominal pain: Secondary | ICD-10-CM | POA: Diagnosis not present

## 2022-06-05 DIAGNOSIS — Z8249 Family history of ischemic heart disease and other diseases of the circulatory system: Secondary | ICD-10-CM

## 2022-06-05 DIAGNOSIS — Z9842 Cataract extraction status, left eye: Secondary | ICD-10-CM

## 2022-06-05 DIAGNOSIS — E1165 Type 2 diabetes mellitus with hyperglycemia: Secondary | ICD-10-CM | POA: Diagnosis not present

## 2022-06-05 DIAGNOSIS — I700 Atherosclerosis of aorta: Secondary | ICD-10-CM | POA: Diagnosis not present

## 2022-06-05 DIAGNOSIS — Z79899 Other long term (current) drug therapy: Secondary | ICD-10-CM

## 2022-06-05 DIAGNOSIS — I5043 Acute on chronic combined systolic (congestive) and diastolic (congestive) heart failure: Secondary | ICD-10-CM | POA: Diagnosis not present

## 2022-06-05 DIAGNOSIS — I480 Paroxysmal atrial fibrillation: Secondary | ICD-10-CM | POA: Diagnosis present

## 2022-06-05 DIAGNOSIS — E669 Obesity, unspecified: Secondary | ICD-10-CM | POA: Diagnosis present

## 2022-06-05 DIAGNOSIS — Z87442 Personal history of urinary calculi: Secondary | ICD-10-CM

## 2022-06-05 DIAGNOSIS — E1152 Type 2 diabetes mellitus with diabetic peripheral angiopathy with gangrene: Secondary | ICD-10-CM | POA: Diagnosis present

## 2022-06-05 DIAGNOSIS — Z8673 Personal history of transient ischemic attack (TIA), and cerebral infarction without residual deficits: Secondary | ICD-10-CM

## 2022-06-05 DIAGNOSIS — Y835 Amputation of limb(s) as the cause of abnormal reaction of the patient, or of later complication, without mention of misadventure at the time of the procedure: Secondary | ICD-10-CM | POA: Diagnosis present

## 2022-06-05 DIAGNOSIS — E1143 Type 2 diabetes mellitus with diabetic autonomic (poly)neuropathy: Secondary | ICD-10-CM | POA: Diagnosis present

## 2022-06-05 DIAGNOSIS — K5909 Other constipation: Secondary | ICD-10-CM | POA: Diagnosis not present

## 2022-06-05 DIAGNOSIS — Z7982 Long term (current) use of aspirin: Secondary | ICD-10-CM

## 2022-06-05 DIAGNOSIS — S36029A Unspecified contusion of spleen, initial encounter: Secondary | ICD-10-CM | POA: Diagnosis not present

## 2022-06-05 DIAGNOSIS — K8020 Calculus of gallbladder without cholecystitis without obstruction: Secondary | ICD-10-CM | POA: Diagnosis not present

## 2022-06-05 DIAGNOSIS — D735 Infarction of spleen: Secondary | ICD-10-CM | POA: Diagnosis present

## 2022-06-05 DIAGNOSIS — K6289 Other specified diseases of anus and rectum: Secondary | ICD-10-CM | POA: Diagnosis not present

## 2022-06-05 DIAGNOSIS — E1151 Type 2 diabetes mellitus with diabetic peripheral angiopathy without gangrene: Secondary | ICD-10-CM | POA: Diagnosis present

## 2022-06-05 DIAGNOSIS — R1312 Dysphagia, oropharyngeal phase: Secondary | ICD-10-CM | POA: Diagnosis not present

## 2022-06-05 DIAGNOSIS — K5900 Constipation, unspecified: Secondary | ICD-10-CM

## 2022-06-05 DIAGNOSIS — R531 Weakness: Secondary | ICD-10-CM | POA: Diagnosis not present

## 2022-06-05 DIAGNOSIS — I252 Old myocardial infarction: Secondary | ICD-10-CM

## 2022-06-05 DIAGNOSIS — I951 Orthostatic hypotension: Secondary | ICD-10-CM | POA: Diagnosis not present

## 2022-06-05 DIAGNOSIS — K5732 Diverticulitis of large intestine without perforation or abscess without bleeding: Secondary | ICD-10-CM | POA: Diagnosis not present

## 2022-06-05 DIAGNOSIS — Z90710 Acquired absence of both cervix and uterus: Secondary | ICD-10-CM

## 2022-06-05 DIAGNOSIS — N281 Cyst of kidney, acquired: Secondary | ICD-10-CM | POA: Diagnosis not present

## 2022-06-05 DIAGNOSIS — S88111A Complete traumatic amputation at level between knee and ankle, right lower leg, initial encounter: Secondary | ICD-10-CM

## 2022-06-05 DIAGNOSIS — Z79891 Long term (current) use of opiate analgesic: Secondary | ICD-10-CM

## 2022-06-05 DIAGNOSIS — R41841 Cognitive communication deficit: Secondary | ICD-10-CM | POA: Diagnosis not present

## 2022-06-05 DIAGNOSIS — Z6838 Body mass index (BMI) 38.0-38.9, adult: Secondary | ICD-10-CM

## 2022-06-05 DIAGNOSIS — R609 Edema, unspecified: Secondary | ICD-10-CM | POA: Diagnosis not present

## 2022-06-05 DIAGNOSIS — F418 Other specified anxiety disorders: Secondary | ICD-10-CM | POA: Diagnosis not present

## 2022-06-05 DIAGNOSIS — R1111 Vomiting without nausea: Secondary | ICD-10-CM | POA: Diagnosis not present

## 2022-06-05 DIAGNOSIS — I959 Hypotension, unspecified: Secondary | ICD-10-CM | POA: Diagnosis not present

## 2022-06-05 DIAGNOSIS — Z9981 Dependence on supplemental oxygen: Secondary | ICD-10-CM

## 2022-06-05 DIAGNOSIS — I5042 Chronic combined systolic (congestive) and diastolic (congestive) heart failure: Secondary | ICD-10-CM | POA: Diagnosis not present

## 2022-06-05 DIAGNOSIS — I2510 Atherosclerotic heart disease of native coronary artery without angina pectoris: Secondary | ICD-10-CM | POA: Diagnosis not present

## 2022-06-05 DIAGNOSIS — R079 Chest pain, unspecified: Secondary | ICD-10-CM | POA: Diagnosis not present

## 2022-06-05 DIAGNOSIS — I120 Hypertensive chronic kidney disease with stage 5 chronic kidney disease or end stage renal disease: Secondary | ICD-10-CM | POA: Diagnosis not present

## 2022-06-05 LAB — COMPREHENSIVE METABOLIC PANEL
ALT: 11 U/L (ref 0–44)
AST: 11 U/L — ABNORMAL LOW (ref 15–41)
Albumin: 2 g/dL — ABNORMAL LOW (ref 3.5–5.0)
Alkaline Phosphatase: 101 U/L (ref 38–126)
Anion gap: 9 (ref 5–15)
BUN: 28 mg/dL — ABNORMAL HIGH (ref 8–23)
CO2: 28 mmol/L (ref 22–32)
Calcium: 8.4 mg/dL — ABNORMAL LOW (ref 8.9–10.3)
Chloride: 93 mmol/L — ABNORMAL LOW (ref 98–111)
Creatinine, Ser: 2.43 mg/dL — ABNORMAL HIGH (ref 0.44–1.00)
GFR, Estimated: 21 mL/min — ABNORMAL LOW (ref >60)
Glucose, Bld: 346 mg/dL — ABNORMAL HIGH (ref 70–99)
Potassium: 3.5 mmol/L (ref 3.5–5.1)
Sodium: 130 mmol/L — ABNORMAL LOW (ref 135–145)
Total Bilirubin: 0.6 mg/dL (ref 0.3–1.2)
Total Protein: 5.8 g/dL — ABNORMAL LOW (ref 6.5–8.1)

## 2022-06-05 LAB — CBC WITH DIFFERENTIAL/PLATELET
Abs Immature Granulocytes: 0.09 10*3/uL — ABNORMAL HIGH (ref 0.00–0.07)
Basophils Absolute: 0 10*3/uL (ref 0.0–0.1)
Basophils Relative: 0 %
Eosinophils Absolute: 0.1 10*3/uL (ref 0.0–0.5)
Eosinophils Relative: 1 %
HCT: 29.1 % — ABNORMAL LOW (ref 36.0–46.0)
Hemoglobin: 8.9 g/dL — ABNORMAL LOW (ref 12.0–15.0)
Immature Granulocytes: 1 %
Lymphocytes Relative: 10 %
Lymphs Abs: 1 10*3/uL (ref 0.7–4.0)
MCH: 27 pg (ref 26.0–34.0)
MCHC: 30.6 g/dL (ref 30.0–36.0)
MCV: 88.2 fL (ref 80.0–100.0)
Monocytes Absolute: 0.6 10*3/uL (ref 0.1–1.0)
Monocytes Relative: 5 %
Neutro Abs: 8.6 10*3/uL — ABNORMAL HIGH (ref 1.7–7.7)
Neutrophils Relative %: 83 %
Platelets: 246 10*3/uL (ref 150–400)
RBC: 3.3 MIL/uL — ABNORMAL LOW (ref 3.87–5.11)
RDW: 19.3 % — ABNORMAL HIGH (ref 11.5–15.5)
WBC: 10.4 10*3/uL (ref 4.0–10.5)
nRBC: 0 % (ref 0.0–0.2)

## 2022-06-05 LAB — GLUCOSE, CAPILLARY
Glucose-Capillary: 371 mg/dL — ABNORMAL HIGH (ref 70–99)
Glucose-Capillary: 305 mg/dL — ABNORMAL HIGH (ref 70–99)

## 2022-06-05 LAB — C DIFFICILE QUICK SCREEN W PCR REFLEX
C Diff antigen: POSITIVE — AB
C Diff toxin: NEGATIVE

## 2022-06-05 LAB — LIPASE, BLOOD: Lipase: 28 U/L (ref 11–51)

## 2022-06-05 LAB — CLOSTRIDIUM DIFFICILE BY PCR, REFLEXED: Toxigenic C. Difficile by PCR: POSITIVE — AB

## 2022-06-05 MED ORDER — OXYCODONE-ACETAMINOPHEN 5-325 MG PO TABS
1.0000 | ORAL_TABLET | ORAL | Status: DC | PRN
  Administered 2022-06-06 – 2022-06-12 (×15): 1 via ORAL
  Filled 2022-06-05 (×17): qty 1

## 2022-06-05 MED ORDER — VANCOMYCIN HCL 125 MG PO CAPS: 125.0000 mg | ORAL_CAPSULE | ORAL | Status: DC

## 2022-06-05 MED ORDER — SACCHAROMYCES BOULARDII 250 MG PO CAPS
500.0000 mg | ORAL_CAPSULE | Freq: Two times a day (BID) | ORAL | Status: DC
  Administered 2022-06-05 – 2022-06-17 (×22): 500 mg via ORAL
  Filled 2022-06-05 (×22): qty 2

## 2022-06-05 MED ORDER — RENA-VITE PO TABS
1.0000 | ORAL_TABLET | Freq: Every day | ORAL | Status: DC
  Administered 2022-06-05 – 2022-06-17 (×11): 1 via ORAL
  Filled 2022-06-05 (×11): qty 1

## 2022-06-05 MED ORDER — SODIUM CHLORIDE 0.9% FLUSH
3.0000 mL | Freq: Two times a day (BID) | INTRAVENOUS | Status: DC
  Administered 2022-06-05 – 2022-06-16 (×19): 3 mL via INTRAVENOUS

## 2022-06-05 MED ORDER — ASPIRIN 81 MG PO TBEC
81.0000 mg | DELAYED_RELEASE_TABLET | Freq: Every day | ORAL | Status: DC
  Filled 2022-06-05: qty 1

## 2022-06-05 MED ORDER — HYDROMORPHONE HCL 1 MG/ML IJ SOLN
0.5000 mg | Freq: Once | INTRAMUSCULAR | Status: AC
  Administered 2022-06-05: 0.5 mg via INTRAVENOUS
  Filled 2022-06-05: qty 1

## 2022-06-05 MED ORDER — METRONIDAZOLE 500 MG/100ML IV SOLN: 500.0000 mg | Freq: Two times a day (BID) | INTRAVENOUS | Status: DC

## 2022-06-05 MED ORDER — VANCOMYCIN HCL 125 MG PO CAPS
125.0000 mg | ORAL_CAPSULE | Freq: Four times a day (QID) | ORAL | Status: DC
  Administered 2022-06-06 (×2): 125 mg via ORAL
  Filled 2022-06-05 (×2): qty 1

## 2022-06-05 MED ORDER — METRONIDAZOLE 500 MG/100ML IV SOLN
500.0000 mg | Freq: Once | INTRAVENOUS | Status: DC
  Administered 2022-06-05: 500 mg via INTRAVENOUS
  Filled 2022-06-05: qty 100

## 2022-06-05 MED ORDER — ALBUTEROL SULFATE (2.5 MG/3ML) 0.083% IN NEBU
2.5000 mg | INHALATION_SOLUTION | Freq: Four times a day (QID) | RESPIRATORY_TRACT | Status: DC | PRN
  Administered 2022-06-09 – 2022-06-11 (×2): 2.5 mg via RESPIRATORY_TRACT
  Filled 2022-06-05 (×4): qty 3

## 2022-06-05 MED ORDER — HYDRALAZINE HCL 25 MG PO TABS: 25.0000 mg | ORAL_TABLET | Freq: Four times a day (QID) | ORAL | Status: DC | PRN

## 2022-06-05 MED ORDER — INSULIN ASPART 100 UNIT/ML IJ SOLN
0.0000 [IU] | Freq: Every day | INTRAMUSCULAR | Status: DC
  Administered 2022-06-05: 4 [IU] via SUBCUTANEOUS
  Administered 2022-06-07: 3 [IU] via SUBCUTANEOUS
  Administered 2022-06-08: 4 [IU] via SUBCUTANEOUS
  Administered 2022-06-09 – 2022-06-10 (×2): 3 [IU] via SUBCUTANEOUS
  Administered 2022-06-11: 4 [IU] via SUBCUTANEOUS
  Administered 2022-06-12: 2 [IU] via SUBCUTANEOUS
  Administered 2022-06-13: 4 [IU] via SUBCUTANEOUS
  Administered 2022-06-14: 2 [IU] via SUBCUTANEOUS

## 2022-06-05 MED ORDER — LEVOTHYROXINE SODIUM 50 MCG PO TABS
50.0000 ug | ORAL_TABLET | Freq: Every day | ORAL | Status: DC
  Administered 2022-06-06 – 2022-06-17 (×12): 50 ug via ORAL
  Filled 2022-06-05: qty 2
  Filled 2022-06-05 (×2): qty 1
  Filled 2022-06-05: qty 2
  Filled 2022-06-05 (×2): qty 1
  Filled 2022-06-05: qty 2
  Filled 2022-06-05 (×4): qty 1
  Filled 2022-06-05: qty 2

## 2022-06-05 MED ORDER — HEPARIN SODIUM (PORCINE) 5000 UNIT/ML IJ SOLN
5000.0000 [IU] | Freq: Three times a day (TID) | INTRAMUSCULAR | Status: DC
  Administered 2022-06-05 – 2022-06-06 (×3): 5000 [IU] via SUBCUTANEOUS
  Filled 2022-06-05 (×3): qty 1

## 2022-06-05 MED ORDER — PIPERACILLIN-TAZOBACTAM IN DEX 2-0.25 GM/50ML IV SOLN
2.2500 g | Freq: Three times a day (TID) | INTRAVENOUS | Status: DC
  Administered 2022-06-05 – 2022-06-06 (×3): 2.25 g via INTRAVENOUS
  Filled 2022-06-05 (×3): qty 50

## 2022-06-05 MED ORDER — TRIMETHOBENZAMIDE HCL 100 MG/ML IM SOLN
200.0000 mg | Freq: Four times a day (QID) | INTRAMUSCULAR | Status: DC | PRN
  Filled 2022-06-05: qty 2

## 2022-06-05 MED ORDER — SEVELAMER CARBONATE 800 MG PO TABS
1600.0000 mg | ORAL_TABLET | Freq: Three times a day (TID) | ORAL | Status: DC
  Administered 2022-06-06 – 2022-06-07 (×3): 1600 mg via ORAL
  Filled 2022-06-05 (×3): qty 2

## 2022-06-05 MED ORDER — INSULIN GLARGINE-YFGN 100 UNIT/ML ~~LOC~~ SOLN
20.0000 [IU] | Freq: Every day | SUBCUTANEOUS | Status: DC
  Administered 2022-06-05 – 2022-06-07 (×3): 20 [IU] via SUBCUTANEOUS
  Filled 2022-06-05 (×4): qty 0.2

## 2022-06-05 MED ORDER — VORTIOXETINE HBR 20 MG PO TABS
20.0000 mg | ORAL_TABLET | Freq: Every morning | ORAL | Status: DC
  Administered 2022-06-06 – 2022-06-17 (×10): 20 mg via ORAL
  Filled 2022-06-05 (×12): qty 1

## 2022-06-05 MED ORDER — MIDODRINE HCL 5 MG PO TABS
5.0000 mg | ORAL_TABLET | Freq: Three times a day (TID) | ORAL | Status: DC
  Administered 2022-06-05 – 2022-06-17 (×31): 5 mg via ORAL
  Filled 2022-06-05 (×34): qty 1

## 2022-06-05 MED ORDER — FENTANYL CITRATE PF 50 MCG/ML IJ SOSY
50.0000 ug | PREFILLED_SYRINGE | Freq: Once | INTRAMUSCULAR | Status: AC
  Administered 2022-06-05: 50 ug via INTRAVENOUS
  Filled 2022-06-05: qty 1

## 2022-06-05 MED ORDER — VANCOMYCIN HCL 125 MG PO CAPS: 125.0000 mg | ORAL_CAPSULE | Freq: Two times a day (BID) | ORAL | Status: DC

## 2022-06-05 MED ORDER — AMIODARONE HCL 200 MG PO TABS
200.0000 mg | ORAL_TABLET | Freq: Every day | ORAL | Status: DC
  Administered 2022-06-05 – 2022-06-17 (×11): 200 mg via ORAL
  Filled 2022-06-05 (×12): qty 1

## 2022-06-05 MED ORDER — ACETAMINOPHEN 325 MG PO TABS
650.0000 mg | ORAL_TABLET | Freq: Four times a day (QID) | ORAL | Status: DC | PRN
  Administered 2022-06-05: 650 mg via ORAL
  Filled 2022-06-05: qty 2

## 2022-06-05 MED ORDER — MIDODRINE HCL 5 MG PO TABS
10.0000 mg | ORAL_TABLET | ORAL | Status: DC
  Administered 2022-06-06 – 2022-06-16 (×4): 10 mg via ORAL
  Filled 2022-06-05 (×4): qty 2

## 2022-06-05 MED ORDER — FENTANYL CITRATE (PF) 100 MCG/2ML IJ SOLN: 25.0000 ug | INTRAMUSCULAR | Status: DC | PRN

## 2022-06-05 MED ORDER — PANTOPRAZOLE SODIUM 40 MG PO TBEC
40.0000 mg | DELAYED_RELEASE_TABLET | Freq: Every day | ORAL | Status: DC
  Administered 2022-06-05 – 2022-06-17 (×11): 40 mg via ORAL
  Filled 2022-06-05 (×11): qty 1

## 2022-06-05 MED ORDER — VANCOMYCIN HCL 125 MG PO CAPS: 125.0000 mg | ORAL_CAPSULE | Freq: Every day | ORAL | Status: DC

## 2022-06-05 MED ORDER — PREGABALIN 75 MG PO CAPS
75.0000 mg | ORAL_CAPSULE | Freq: Every day | ORAL | Status: DC
  Administered 2022-06-05 – 2022-06-17 (×11): 75 mg via ORAL
  Filled 2022-06-05 (×11): qty 1

## 2022-06-05 MED ORDER — ACETAMINOPHEN 650 MG RE SUPP: 650.0000 mg | Freq: Four times a day (QID) | RECTAL | Status: DC | PRN

## 2022-06-05 MED ORDER — PIPERACILLIN-TAZOBACTAM 3.375 G IVPB: 3.3750 g | Freq: Three times a day (TID) | INTRAVENOUS | Status: DC

## 2022-06-05 MED ORDER — INSULIN ASPART 100 UNIT/ML IJ SOLN
0.0000 [IU] | Freq: Three times a day (TID) | INTRAMUSCULAR | Status: DC
  Administered 2022-06-05: 15 [IU] via SUBCUTANEOUS
  Administered 2022-06-06: 5 [IU] via SUBCUTANEOUS
  Administered 2022-06-06: 3 [IU] via SUBCUTANEOUS
  Administered 2022-06-07: 8 [IU] via SUBCUTANEOUS
  Administered 2022-06-08: 15 [IU] via SUBCUTANEOUS
  Administered 2022-06-08 (×2): 8 [IU] via SUBCUTANEOUS
  Administered 2022-06-09: 2 [IU] via SUBCUTANEOUS
  Administered 2022-06-09: 5 [IU] via SUBCUTANEOUS
  Administered 2022-06-10: 2 [IU] via SUBCUTANEOUS
  Administered 2022-06-10: 3 [IU] via SUBCUTANEOUS
  Administered 2022-06-10: 5 [IU] via SUBCUTANEOUS
  Administered 2022-06-11: 11 [IU] via SUBCUTANEOUS
  Administered 2022-06-11: 5 [IU] via SUBCUTANEOUS
  Administered 2022-06-12: 11 [IU] via SUBCUTANEOUS
  Administered 2022-06-12 – 2022-06-14 (×2): 8 [IU] via SUBCUTANEOUS
  Administered 2022-06-14: 3 [IU] via SUBCUTANEOUS
  Administered 2022-06-14: 8 [IU] via SUBCUTANEOUS
  Administered 2022-06-15 (×2): 5 [IU] via SUBCUTANEOUS
  Administered 2022-06-15 – 2022-06-16 (×2): 2 [IU] via SUBCUTANEOUS
  Administered 2022-06-17 (×2): 3 [IU] via SUBCUTANEOUS

## 2022-06-05 NOTE — Consult Note (Addendum)
Susan [name] 10-Feb-1951  [id number].    Requesting MD: Regenia Skeeter, MD Chief Complaint/Reason for Consult: abdominal pain, perisplenic fluid collection  HPI:  Susan [name] is a 72 y/o F with a MMP including, but not limited to, CAD, CHF, HTN, PVD, obesity, DM2, afib on eliquis, recurrent c.dif, and ESRD on HD M/W/F who presents with acute onset abdominal pain. Pain described as sharp lower abdominal pain that woke up up from her sleep yesterday at 0500. Pain gradually worsened and she presented to ED for evaluation. Reports one episode of emesis in ED waiting room. She was recently admitted to the hospital 05/22/22-05/29/22 for bleeding from a rectal ulcer, during which time underwent sigmoidoscopy x2 with failed attempts to control bleeding. She then required over sewing of the rectal ulcer on 12/30.  She reports non-bloody diarrhea that has been present since her last hospital admission. She attributes this to taking laxatives. When I ask the patient if she takes a blood thinner she reports taking eliquis, last dose yesterday morning, but according to discharge summary from last hospital day, the Eliquis was discontinued after her rectal bleed. She is currently living at [facility]. She is on supplemental oxygen at baseline. She tells me she is upposed to have revision of her RLE amputation tomorrow.  ROS: As above Review of Systems  All other systems reviewed and are negative.   Family History  Problem Relation Age of Onset   Heart attack Mother 35    Past Medical History:  Diagnosis Date   Acute MI ([place]) 1999; 2007; 03/05/21   Anemia    hx   Anginal pain (HCC)    Anxiety    ARF (acute renal failure) ([place]) 06/2017   [facility]   Arthritis    "generalized" (03/15/2014)   CAD (coronary artery disease)    MI in 2000 - MI  2007 - treated bare metal stent (no nuclear since then as 9/11)   Carotid artery disease (HCC)    CHF (congestive heart failure) ([place])    HFrEF  03/06/21   Chronic diastolic heart failure ([place])    a) ECHO (08/2013) EF 55-60% and RV function nl b) RHC (08/2013) RA 4, RV 30/5/7, PA 25/10 (16), PCWP 7, Fick CO/CI 6.3/2.7, PVR 1.5 WU, PA 61 and 66%   Daily headache    "~ every other day; since I fell in June" (03/15/2014)   Depression    Dyslipidemia    Dyspnea    uses oxygen qhs via [place]   ESRD (end stage renal disease) ([place])    Dialysis on Tues Thurs Sat   Exertional shortness of breath    History of kidney stones    HTN (hypertension)    Hypothyroidism    Neuropathy    Obesity    Osteoarthritis    PAF (paroxysmal atrial fibrillation) (HCC)    Peripheral neuropathy    bilateral feet/hands   PONV (postoperative nausea and vomiting)    RBBB (right bundle branch block)    Old   Stroke ([place])    mini strokes   Syncope    likely due to low blood sugar   Tachycardia    Sinus tachycardia   Type II diabetes mellitus (HCC)    Type II, Lamar Sprinkles libre left upper arm. patient has omnipod insulin pump with Novolin R Insulin   Urinary incontinence    Venous insufficiency     Past Surgical History:  Procedure Laterality Date   ABDOMINAL HYSTERECTOMY  1980's   AMPUTATION Right 02/24/2018   Procedure: RIGHT FOOT GREAT TOE AND 2ND TOE AMPUTATION;  Surgeon: Newt Minion, MD;  Location: [place];  Service: Orthopedics;  Laterality: Right;   AMPUTATION Right 04/30/2018   Procedure: RIGHT TRANSMETATARSAL AMPUTATION;  Surgeon: Newt Minion, MD;  Location: [place];  Service: Orthopedics;  Laterality: Right;   AMPUTATION Right 05/02/2022   Procedure: RIGHT BELOW KNEE AMPUTATION;  Surgeon: Newt Minion, MD;  Location: [place];  Service: Orthopedics;  Laterality: Right;   AV FISTULA PLACEMENT Left 04/02/2022   Procedure: LEFT ARM ARTERIOVENOUS (AV) FISTULA CREATION;  Surgeon: Serafina Mitchell, MD;  Location: [place];  Service: Vascular;  Laterality: Left;  PERIPHERAL NERVE BLOCK   BIOPSY  05/27/2020   Procedure: BIOPSY;  Surgeon: [name], DO;   [facility];  Service: Endoscopy;;   CATARACT EXTRACTION, BILATERAL Bilateral ?2013   COLONOSCOPY W/ POLYPECTOMY     COLONOSCOPY WITH PROPOFOL N/A 03/13/2019   Procedure: COLONOSCOPY WITH PROPOFOL;  Surgeon: Jerene Bears, MD;  Location: [place];  Service: Gastroenterology;  Laterality: N/A;   CORONARY ANGIOPLASTY WITH STENT PLACEMENT  1999; 2007   "1 + 1"   ERCP N/A 02/03/2022   Procedure: ENDOSCOPIC RETROGRADE CHOLANGIOPANCREATOGRAPHY (ERCP);  Surgeon: Carol Ada, MD;  Location: [place];  Service: Gastroenterology;  Laterality: N/A;   ESOPHAGOGASTRODUODENOSCOPY (EGD) WITH PROPOFOL N/A 03/13/2019   Procedure: ESOPHAGOGASTRODUODENOSCOPY (EGD) WITH PROPOFOL;  Surgeon: Jerene Bears, MD;  Location: [facility] ENDOSCOPY;  Service: Gastroenterology;  Laterality: N/A;   ESOPHAGOGASTRODUODENOSCOPY (EGD) WITH PROPOFOL N/A 05/27/2020   Procedure: ESOPHAGOGASTRODUODENOSCOPY (EGD) WITH PROPOFOL;  Surgeon: [name], DO;  [facility];  Service: Endoscopy;  Laterality: N/A;   EYE SURGERY Bilateral    [place]   FLEXIBLE SIGMOIDOSCOPY N/A 05/23/2022   Procedure: FLEXIBLE SIGMOIDOSCOPY;  Surgeon: Carol Ada, MD;  Location: [place];  Service: Gastroenterology;  Laterality: N/A;   FLEXIBLE SIGMOIDOSCOPY N/A 05/24/2022   Procedure: FLEXIBLE SIGMOIDOSCOPY;  Surgeon: Sharyn Creamer, MD;  Location: [place];  Service: Gastroenterology;  Laterality: N/A;   HEMOSTASIS CLIP PLACEMENT  03/13/2019   Procedure: HEMOSTASIS CLIP PLACEMENT;  Surgeon: Jerene Bears, MD;  Location: [facility] ENDOSCOPY;  Service: Gastroenterology;;   HEMOSTASIS CLIP PLACEMENT  05/23/2022   Procedure: HEMOSTASIS CLIP PLACEMENT;  Surgeon: Carol Ada, MD;  Location: [place];  Service: Gastroenterology;;   HEMOSTASIS CONTROL  05/24/2022   Procedure: HEMOSTASIS CONTROL;  Surgeon: Sharyn Creamer, MD;  Location: [place];  Service: Gastroenterology;;   HOT HEMOSTASIS N/A 05/23/2022   Procedure: HOT  HEMOSTASIS (ARGON PLASMA COAGULATION/BICAP);  Surgeon: Carol Ada, MD;  Location: [place];  Service: Gastroenterology;  Laterality: N/A;   I & D EXTREMITY Left 05/05/2022   Procedure: IRRIGATION AND DEBRIDEMENT LEFT ARM AV FISTULA;  Surgeon: Marty Heck, MD;  Location: [place];  Service: Vascular;  Laterality: Left;   INSERTION OF DIALYSIS CATHETER Right 04/02/2022   Procedure: INSERTION OF TUNNELED DIALYSIS CATHETER;  Surgeon: Serafina Mitchell, MD;  Location[facility];  Service: Vascular;  Laterality: Right;   KNEE ARTHROSCOPY Left 10/25/2006   POLYPECTOMY  03/13/2019   Procedure: POLYPECTOMY;  Surgeon: Jerene Bears, MD;  Location[facility];  Service: Gastroenterology;;   REMOVAL OF STONES  02/03/2022   Procedure: REMOVAL OF STONES;  Surgeon: Carol Ada, MD;  Location: [place];  Service: Gastroenterology;;   RIGHT HEART CATH N/A 07/24/2017   Procedure: RIGHT HEART CATH;  Surgeon: Jolaine Artist,  MD;  Location: [place] [facility];  Service: Cardiovascular;  Laterality: N/A;   RIGHT HEART CATHETERIZATION N/A 09/22/2013   Procedure: RIGHT HEART CATH;  Surgeon: Jolaine Artist, MD;  Location: [facility] CATH LAB;  Service: Cardiovascular;  Laterality: N/A;   SHOULDER ARTHROSCOPY WITH OPEN ROTATOR CUFF REPAIR Right 03/14/2014   Procedure: RIGHT SHOULDER ARTHROSCOPY WITH BICEPS RELEASE, OPEN SUBSCAPULA REPAIR, OPEN SUPRASPINATUS REPAIR.;  Surgeon: Meredith Pel, MD;  Location: [place];  Service: Orthopedics;  Laterality: Right;   SPHINCTEROTOMY  02/03/2022   Procedure: SPHINCTEROTOMY;  Surgeon: Carol Ada, MD;  Location: [place];  Service: Gastroenterology;;   TEE WITHOUT CARDIOVERSION N/A 02/04/2022   Procedure: TRANSESOPHAGEAL ECHOCARDIOGRAM (TEE);  Surgeon: Jolaine Artist, MD;  Location: [facility] ENDOSCOPY;  Service: Cardiovascular;  Laterality: N/A;   TOE AMPUTATION Right 02/24/2018   GREAT TOE AND 2ND TOE AMPUTATION   TUBAL LIGATION  1970's    Social History:   reports that she quit smoking about 24 years ago. Her smoking use included cigarettes. She has a 96.00 pack-year smoking history. She has never used smokeless tobacco. She reports that she does not currently use alcohol. She reports that she does not use drugs.  Allergies:  Allergies  Allergen Reactions   Cephalexin Diarrhea and Other (See Comments)   Codeine Nausea And Vomiting and Other (See Comments)    (Not in a hospital admission)    Physical Exam: Blood pressure (!) 128/54, pulse 88, temperature 97.8 F (36.6 C), resp. rate 14, SpO2 99 %. General: chronically ill appearing white female, laying on hospital bed, NAD HEENT: head -normocephalic, atraumatic; Eyes: PERRLA, no conjunctival injection, anicteric sclerae  CV- RRR, there is LLE edema  Pulm- breathing is non-labored on nasal cannula, + wheezes  Abd- soft, obese, TTP RLQ and suprapubic regions without rebound tenderness, non-tender upper abdomen, non-tender left flank GU- deferred  MSK- UE symmetrical, LLE edema  Neuro- non-focal exam Psych- Alert and Oriented x3 with appropriate affect Skin: warm and dry, no rashes or lesions   Results for orders placed or performed during the hospital encounter of 06/05/22 (from the past 48 hour(s))  CBC with Differential     Status: Abnormal   Collection Time: 06/05/22  2:53 AM  Result Value Ref Range   WBC 10.4 4.0 - 10.5 K/uL   RBC 3.30 (L) 3.87 - 5.11 MIL/uL   Hemoglobin 8.9 (L) 12.0 - 15.0 g/dL   HCT 29.1 (L) 36.0 - 46.0 %   MCV 88.2 80.0 - 100.0 fL   MCH 27.0 26.0 - 34.0 pg   MCHC 30.6 30.0 - 36.0 g/dL   RDW 19.3 (H) 11.5 - 15.5 %   Platelets 246 150 - 400 K/uL   nRBC 0.0 0.0 - 0.2 %   Neutrophils Relative % 83 %   Neutro Abs 8.6 (H) 1.7 - 7.7 K/uL   Lymphocytes Relative 10 %   Lymphs Abs 1.0 0.7 - 4.0 K/uL   Monocytes Relative 5 %   Monocytes Absolute 0.6 0.1 - 1.0 K/uL   Eosinophils Relative 1 %   Eosinophils Absolute 0.1 0.0 - 0.5 K/uL   Basophils Relative 0 %    Basophils Absolute 0.0 0.0 - 0.1 K/uL   Immature Granulocytes 1 %   Abs Immature Granulocytes 0.09 (H) 0.00 - 0.07 K/uL    Comment: Performed at [facility], [address], [place], [place] [address]  Comprehensive metabolic panel     Status: Abnormal   Collection Time: 06/05/22  2:53 AM  Result Value Ref Range   Sodium 130 (L) 135 - 145 mmol/L   Potassium 3.5 3.5 - 5.1 mmol/L   Chloride 93 (L) 98 - 111 mmol/L   CO2 28 22 - 32 mmol/L   Glucose, Bld 346 (H) 70 - 99 mg/dL    Comment: Glucose reference range applies only to samples taken after fasting for at least 8 hours.   BUN 28 (H) 8 - 23 mg/dL   Creatinine, Ser 2.43 (H) 0.44 - 1.00 mg/dL   Calcium 8.4 (L) 8.9 - 10.3 mg/dL   Total Protein 5.8 (L) 6.5 - 8.1 g/dL   Albumin 2.0 (L) 3.5 - 5.0 g/dL   AST 11 (L) 15 - 41 U/L   ALT 11 0 - 44 U/L   Alkaline Phosphatase 101 38 - 126 U/L   Total Bilirubin 0.6 0.3 - 1.2 mg/dL   GFR, Estimated 21 (L) >60 mL/min    Comment: (NOTE) Calculated using the CKD-EPI Creatinine Equation (2021)    Anion gap 9 5 - 15    Comment: Performed at [facility], [place] [address] [place], [place] [address]  Lipase, blood     Status: None   Collection Time: 06/05/22  2:53 AM  Result Value Ref Range   Lipase 28 11 - 51 U/L    Comment: Performed at [facility], [place] [address], [place], [place] [address]   *Note: Due to a large number of results and/or encounters for the requested time period, some results have not been displayed. A complete set of results can be found in Results Review.   CT ABDOMEN PELVIS WO CONTRAST  Result Date: 06/05/2022 CLINICAL DATA:  Abdominal pain. EXAM: CT ABDOMEN AND PELVIS WITHOUT CONTRAST TECHNIQUE: Multidetector CT imaging of the abdomen and pelvis was performed following the standard protocol without IV contrast. RADIATION DOSE REDUCTION: This exam was performed according to the departmental dose-optimization program which includes automated exposure control,  adjustment of the mA and/or kV according to patient size and/or use of iterative reconstruction technique. COMPARISON:  CTA abdomen and pelvis 05/23/2022, CT abdomen and pelvis with oral contrast only 03/27/2022 FINDINGS: Lower chest: There is interval new demonstration of a small layering left pleural effusion. There is overlying atelectasis. There is mild posterior atelectasis in the right lower lobe with a 2 cm pneumatocele again seen. Lung bases are otherwise unremarkable. The cardiac size is normal. There are calcifications in the coronary arteries. Hepatobiliary: There are stones in the gallbladder without wall thickening or bile duct dilatation. There are calcified granulomas in the liver. Capsular nodularity in the left lobe consistent with cirrhosis. No focal abnormality is seen without contrast. Pancreas: Atrophic and otherwise unremarkable without contrast. Spleen: There are multiple calcified granulomas. The spleen is slightly enlarged. There is a new subcapsular complex hypodense collection in the superior aspect of the spleen measuring 6 x 4.2 x 2 cm. Major differential concerns include a contained subcapsular hematoma and an infectious process. Hounsfield density is difficult to accurately characterize due to streak artifact from the patient's overlying arms, but measures between - 10 and -28 Hounsfield units if accurate. If this is a hematoma there is no perisplenic hemorrhagic products. If this is an abscess, there is no air in it. Adrenals/Urinary Tract: Slight chronic nodular thickening of the adrenal glands. No new adrenal abnormality. Extensive renovascular calcifications at both renal hila are again shown with bilateral renal cortical thinning. Small renal cysts are similar. No new renal abnormality.  No stones or urinary obstruction. Unremarkable bladder. Stomach/Bowel: The stomach contracted and not well seen but no obvious wall thickening suspected. Small-bowel there are increased thickened  folds in the left abdominal small bowel but there is no acute inflammatory change or obstruction. The appendix is normal caliber. There is sigmoid diverticulosis with increased wall thickening and inflammatory stranding involving the mid to distal thirds of the sigmoid colon consistent with acute diverticulitis. There is a large stool ball in the rectum not seen previously and scattered rectal wall pneumatosis. Some of the rectal stool is in the process of being expelled. Vascular/Lymphatic: There are extensive visceral arterial calcifications and moderate aortoiliac calcific plaques. This was described in detail in the recent CTA abdomen and pelvis report. There are mild mesenteric congestive features which have increased in the interval. No abdominal, pelvic or inguinal adenopathy is seen. Reproductive: Status post hysterectomy. No adnexal masses. Other: In addition to increased mesenteric congestion there is increased body wall anasarca. Minimal posterior pelvic ascites is unchanged. There is no free air, free hemorrhage or abscess. No incarcerated hernia. Musculoskeletal: There is osteopenia and degenerative change of the spine. No aggressive bone lesion is seen. IMPRESSION: 1. New 6 x 4.2 x 2 cm complex subcapsular collection in the superior aspect of the spleen. Major differential concerns include a contained subcapsular hematoma and an infectious process. If this is a hematoma there are no perisplenic hemorrhagic products. If this is an abscess, there is no air in it. 2. New small left pleural effusion with overlying atelectasis. 3. Increased mesenteric congestion and body wall anasarca. 4. Increased thickened folds in the left abdominal small bowel without acute inflammatory change. This could be due to mesenteric congestion or enteritis. 5. Large stool ball in the rectum with scattered rectal wall pneumatosis, the latter which could indicate ischemic changes or be due to stercoral proctitis. 6. Sigmoid  diverticulosis with increased wall thickening and inflammatory stranding in the mid to distal sigmoid consistent with acute diverticulitis. No free air or free hemorrhage. 7. Cholelithiasis. 8. Aortic and coronary artery atherosclerosis. 9. Capsular nodularity in the left lobe of liver consistent with cirrhosis. Aortic Atherosclerosis (ICD10-I70.0). Electronically Signed   By: Telford Nab M.D.   On: 06/05/2022 04:21      Assessment/Plan Stercoral colitis 72 y/o F with recent history of admission for rectal bleeding requiring surgical oversew of a bleeding rectal ulcer who presents with diarrhea, RLQ/suprapubic abdominal pain, and evidence of stercoral colitis on CT scan of the abdomen and pelvis. Radiologist also reads mid to descending sigmoid diverticulitis but I have a lower suspicion for this. She is hemodynamically stable, non-toxic appearing, WBC 10.4, and without peritonitis on abdominal exam. Low suspicion for rectal perforation at this time. No emergent surgical needs, Recommend admission for IV abx and observation. Dulcolax suppository ordered. May benefit from manual disimpaction. If concern for rectal perforation develops she may require re-imaging vs emergent surgery. We will follow. C.dif test is also pending.  Perisplenic collection - unclear etiology; possible spontaneous subcapsular hematoma, despite over two weeks off of DOAC. Lower suspicion for infectious collection as there is no gas in the collection. Monitor H&H and abd exam.    FEN - ok to start patient on a liquid diet today VTE - SCD's, ok for DVT ppx, hold anticoagulation ID - Zosyn 1/11 >>  Admit - [facility] service   Recent admission for LGIB  Hx recurrent c.dif - followed by ID, last seen in November, improving; c.dif pending  CHF ESRD  on HD DM2 Hypothyroidism ?cirrhosis     I reviewed nursing notes, ED provider notes, hospitalist notes, last 24 h vitals and pain scores, last 48 h intake and output, last 24 h labs  and trends, and last 24 h imaging results.  Jill Alexanders, PA-C [facility] 06/05/2022, 11:03 AM Please see Amion for pager number during day hours 7:00am-4:30pm or 7:00am -11:30am on weekends

## 2022-06-05 NOTE — ED Notes (Signed)
ED TO INPATIENT HANDOFF REPORT  ED Nurse Name and Phone #:   S Name/Age/Gender Susan [name] 72 y.o. female Room/Bed: 033C/033C  Code Status   Code Status: Full Code  Home/SNF/Other Rehab Patient oriented to: self, place, and time Is this baseline? Yes   Triage Complete: Triage complete  Chief Complaint Diverticulitis [K57.92]  Triage Note Arrives EMS from [facility] being seen for R BKA. 3L [place] at baseline.   Dialysis M, W F. With Left arm restriction.   Lower abdominal pain ~2 pm yesterday. Says pain is similar to when she had c-diff. Has been off enteric precautions x 1 week. Not currently having any diarrhea.   Facility just admin 1 Norco prior to calling paramedics.    Allergies Allergies  Allergen Reactions   Cephalexin Diarrhea and Other (See Comments)   Codeine Nausea And Vomiting and Other (See Comments)    Level of Care/Admitting Diagnosis ED Disposition     ED Disposition  Admit   Condition  --   Comment  Hospital Area: [place] [[id number]]  Level of Care: Telemetry Medical [104]  May admit patient to [name] or [facility] if equivalent level of care is available:: No  Covid Evaluation: Asymptomatic - no recent exposure (last 10 days) testing not required  Diagnosis: Diverticulitis [025852]  Admitting Physician: Norval Morton [[id number]]  Attending Physician: Norval Morton [[id number]]  Certification:: I certify this patient will need inpatient services for at least 2 midnights  Estimated Length of Stay: 3          B Medical/Surgery History Past Medical History:  Diagnosis Date   Acute MI ([place]) 1999; 2007; 03/05/21   Anemia    hx   Anginal pain ([place])    Anxiety    ARF (acute renal failure) ([place]) 06/2017   [facility]   Arthritis    "generalized" (03/15/2014)   CAD (coronary artery disease)    MI in 2000 - MI  2007 - treated bare metal stent (no nuclear since then as 9/11)   Carotid artery  disease (HCC)    CHF (congestive heart failure) ([place])    HFrEF 03/06/21   Chronic diastolic heart failure ([place])    a) ECHO (08/2013) EF 55-60% and RV function nl b) RHC (08/2013) RA 4, RV 30/5/7, PA 25/10 (16), PCWP 7, Fick CO/CI 6.3/2.7, PVR 1.5 WU, PA 61 and 66%   Daily headache    "~ every other day; since I fell in June" (03/15/2014)   Depression    Dyslipidemia    Dyspnea    uses oxygen qhs via [place]   ESRD (end stage renal disease) ([place])    Dialysis on Tues Thurs Sat   Exertional shortness of breath    History of kidney stones    HTN (hypertension)    Hypothyroidism    Neuropathy    Obesity    Osteoarthritis    PAF (paroxysmal atrial fibrillation) (HCC)    Peripheral neuropathy    bilateral feet/hands   PONV (postoperative nausea and vomiting)    RBBB (right bundle branch block)    Old   Stroke ([place])    mini strokes   Syncope    likely due to low blood sugar   Tachycardia    Sinus tachycardia   Type II diabetes mellitus (HCC)    Type II, Lamar Sprinkles libre left upper arm. patient has omnipod insulin pump with Novolin R Insulin   Urinary incontinence  Venous insufficiency    Past Surgical History:  Procedure Laterality Date   ABDOMINAL HYSTERECTOMY  1980's   AMPUTATION Right 02/24/2018   Procedure: RIGHT FOOT GREAT TOE AND 2ND TOE AMPUTATION;  Surgeon: Newt Minion, MD;  Location: [place];  Service: Orthopedics;  Laterality: Right;   AMPUTATION Right 04/30/2018   Procedure: RIGHT TRANSMETATARSAL AMPUTATION;  Surgeon: Newt Minion, MD;  Location: [place];  Service: Orthopedics;  Laterality: Right;   AMPUTATION Right 05/02/2022   Procedure: RIGHT BELOW KNEE AMPUTATION;  Surgeon: Newt Minion, MD;  Location: [place];  Service: Orthopedics;  Laterality: Right;   AV FISTULA PLACEMENT Left 04/02/2022   Procedure: LEFT ARM ARTERIOVENOUS (AV) FISTULA CREATION;  Surgeon: Serafina Mitchell, MD;  Location: [place];  Service: Vascular;  Laterality: Left;  PERIPHERAL NERVE BLOCK   BIOPSY   05/27/2020   Procedure: BIOPSY;  Surgeon: [name], DO;  [facility];  Service: Endoscopy;;   CATARACT EXTRACTION, BILATERAL Bilateral ?2013   COLONOSCOPY W/ POLYPECTOMY     COLONOSCOPY WITH PROPOFOL N/A 03/13/2019   Procedure: COLONOSCOPY WITH PROPOFOL;  Surgeon: Jerene Bears, MD;  Location: [place];  Service: Gastroenterology;  Laterality: N/A;   CORONARY ANGIOPLASTY WITH STENT PLACEMENT  1999; 2007   "1 + 1"   ERCP N/A 02/03/2022   Procedure: ENDOSCOPIC RETROGRADE CHOLANGIOPANCREATOGRAPHY (ERCP);  Surgeon: Carol Ada, MD;  Location: [place];  Service: Gastroenterology;  Laterality: N/A;   ESOPHAGOGASTRODUODENOSCOPY (EGD) WITH PROPOFOL N/A 03/13/2019   Procedure: ESOPHAGOGASTRODUODENOSCOPY (EGD) WITH PROPOFOL;  Surgeon: Jerene Bears, MD;  Location[facility] ENDOSCOPY;  Service: Gastroenterology;  Laterality: N/A;   ESOPHAGOGASTRODUODENOSCOPY (EGD) WITH PROPOFOL N/A 05/27/2020   Procedure: ESOPHAGOGASTRODUODENOSCOPY (EGD) WITH PROPOFOL;  Surgeon: [name], DO;  [facility];  Service: Endoscopy;  Laterality: N/A;   EYE SURGERY Bilateral    [place]azer   FLEXIBLE SIGMOIDOSCOPY N/A 05/23/2022   Procedure: FLEXIBLE SIGMOIDOSCOPY;  Surgeon: Carol Ada, MD;  Location: [place];  Service: Gastroenterology;  Laterality: N/A;   FLEXIBLE SIGMOIDOSCOPY N/A 05/24/2022   Procedure: FLEXIBLE SIGMOIDOSCOPY;  Surgeon: Sharyn Creamer, MD;  Location: [place];  Service: Gastroenterology;  Laterality: N/A;   HEMOSTASIS CLIP PLACEMENT  03/13/2019   Procedure: HEMOSTASIS CLIP PLACEMENT;  Surgeon: Jerene Bears, MD;  Location: [facility] ENDOSCOPY;  Service: Gastroenterology;;   HEMOSTASIS CLIP PLACEMENT  05/23/2022   Procedure: HEMOSTASIS CLIP PLACEMENT;  Surgeon: Carol Ada, MD;  Location: [place];  Service: Gastroenterology;;   HEMOSTASIS CONTROL  05/24/2022   Procedure: HEMOSTASIS CONTROL;  Surgeon: Sharyn Creamer, MD;  Location: [place];  Service:  Gastroenterology;;   HOT HEMOSTASIS N/A 05/23/2022   Procedure: HOT HEMOSTASIS (ARGON PLASMA COAGULATION/BICAP);  Surgeon: Carol Ada, MD;  Location: [place];  Service: Gastroenterology;  Laterality: N/A;   I & D EXTREMITY Left 05/05/2022   Procedure: IRRIGATION AND DEBRIDEMENT LEFT ARM AV FISTULA;  Surgeon: Marty Heck, MD;  Location: [place];  Service: Vascular;  Laterality: Left;   INSERTION OF DIALYSIS CATHETER Right 04/02/2022   Procedure: INSERTION OF TUNNELED DIALYSIS CATHETER;  Surgeon: Serafina Mitchell, MD;  Location: [place];  Service: Vascular;  Laterality: Right;   KNEE ARTHROSCOPY Left 10/25/2006   POLYPECTOMY  03/13/2019   Procedure: POLYPECTOMY;  Surgeon: Jerene Bears, MD;  Location: [facility] ENDOSCOPY;  Service: Gastroenterology;;   REMOVAL OF STONES  02/03/2022   Procedure: REMOVAL OF STONES;  Surgeon: Carol Ada, MD;  Location: [place];  Service: Gastroenterology;;  RIGHT HEART CATH N/A 07/24/2017   Procedure: RIGHT HEART CATH;  Surgeon: Jolaine Artist, MD;  Location: [place] [facility];  Service: Cardiovascular;  Laterality: N/A;   RIGHT HEART CATHETERIZATION N/A 09/22/2013   Procedure: RIGHT HEART CATH;  Surgeon: Jolaine Artist, MD;  [facility] C[facility];  Service: Cardiovascular;  Laterality: N/A;   SHOULDER ARTHROSCOPY WITH OPEN ROTATOR CUFF REPAIR Right 03/14/2014   Procedure: RIGHT SHOULDER ARTHROSCOPY WITH BICEPS RELEASE, OPEN SUBSCAPULA REPAIR, OPEN SUPRASPINATUS REPAIR.;  Surgeon: Meredith Pel, MD;  Location: [place];  Service: Orthopedics;  Laterality: Right;   SPHINCTEROTOMY  02/03/2022   Procedure: SPHINCTEROTOMY;  Surgeon: Carol Ada, MD;  Location: [place];  Service: Gastroenterology;;   TEE WITHOUT CARDIOVERSION N/A 02/04/2022   Procedure: TRANSESOPHAGEAL ECHOCARDIOGRAM (TEE);  Surgeon: Jolaine Artist, MD;  Location: [place];  Service: Cardiovascular;  Laterality: N/A;   TOE AMPUTATION Right 02/24/2018   GREAT TOE AND  2ND TOE AMPUTATION   TUBAL LIGATION  1970's     A IV Location/Drains/Wounds Patient Lines/Drains/Airways Status     Active Line/Drains/Airways     Name Placement date Placement time Site Days   Peripheral IV 06/05/22 18 G 1.16" Anterior;Right Wrist 06/05/22  0232  Wrist  less than 1   Hemodialysis Catheter Right Subclavian Double lumen Permanent (Tunneled) --  --  Subclavian  --   Pressure Injury 05/23/22 Buttocks Right Deep Tissue Pressure Injury - Purple or maroon localized area of discolored intact skin or blood-filled blister due to damage of underlying soft tissue from pressure and/or shear. 05/23/22  1715  -- 13   Pressure Injury 05/23/22 Right;Left;Posterior Stage 1 -  Intact skin with non-blanchable redness of a localized area usually over a bony prominence. 05/23/22  --  -- 13   Wound / Incision (Open or Dehisced) 05/02/22 Irritant Dermatitis (Moisture Associated Skin Damage) Thigh Anterior;Distal;Left 05/02/22  1500  Thigh  34   Wound / Incision (Open or Dehisced) 05/23/22 Non-pressure wound Thigh Right;Anterior black scar 05/23/22  1740  Thigh  13            Intake/Output Last 24 hours  Intake/Output Summary (Last 24 hours) at 06/05/2022 1125 Last data filed at 06/05/2022 1043 Gross per 24 hour  Intake 97.45 ml  Output --  Net 97.45 ml    Labs/Imaging Results for orders placed or performed during the hospital encounter of 06/05/22 (from the past 48 hour(s))  CBC with Differential     Status: Abnormal   Collection Time: 06/05/22  2:53 AM  Result Value Ref Range   WBC 10.4 4.0 - 10.5 K/uL   RBC 3.30 (L) 3.87 - 5.11 MIL/uL   Hemoglobin 8.9 (L) 12.0 - 15.0 g/dL   HCT 29.1 (L) 36.0 - 46.0 %   MCV 88.2 80.0 - 100.0 fL   MCH 27.0 26.0 - 34.0 pg   MCHC 30.6 30.0 - 36.0 g/dL   RDW 19.3 (H) 11.5 - 15.5 %   Platelets 246 150 - 400 K/uL   nRBC 0.0 0.0 - 0.2 %   Neutrophils Relative % 83 %   Neutro Abs 8.6 (H) 1.7 - 7.7 K/uL   Lymphocytes Relative 10 %   Lymphs Abs 1.0  0.7 - 4.0 K/uL   Monocytes Relative 5 %   Monocytes Absolute 0.6 0.1 - 1.0 K/uL   Eosinophils Relative 1 %   Eosinophils Absolute 0.1 0.0 - 0.5 K/uL   Basophils Relative 0 %   Basophils Absolute 0.0 0.0 -  0.1 K/uL   Immature Granulocytes 1 %   Abs Immature Granulocytes 0.09 (H) 0.00 - 0.07 K/uL    Comment: Performed at [facility], [place] [address], [place], [place] [address]  Comprehensive metabolic panel     Status: Abnormal   Collection Time: 06/05/22  2:53 AM  Result Value Ref Range   Sodium 130 (L) 135 - 145 mmol/L   Potassium 3.5 3.5 - 5.1 mmol/L   Chloride 93 (L) 98 - 111 mmol/L   CO2 28 22 - 32 mmol/L   Glucose, Bld 346 (H) 70 - 99 mg/dL    Comment: Glucose reference range applies only to samples taken after fasting for at least 8 hours.   BUN 28 (H) 8 - 23 mg/dL   Creatinine, Ser 2.43 (H) 0.44 - 1.00 mg/dL   Calcium 8.4 (L) 8.9 - 10.3 mg/dL   Total Protein 5.8 (L) 6.5 - 8.1 g/dL   Albumin 2.0 (L) 3.5 - 5.0 g/dL   AST 11 (L) 15 - 41 U/L   ALT 11 0 - 44 U/L   Alkaline Phosphatase 101 38 - 126 U/L   Total Bilirubin 0.6 0.3 - 1.2 mg/dL   GFR, Estimated 21 (L) >60 mL/min    Comment: (NOTE) Calculated using the CKD-EPI Creatinine Equation (2021)    Anion gap 9 5 - 15    Comment: Performed at [facility], [place] [address], [place], [place] [address]  Lipase, blood     Status: None   Collection Time: 06/05/22  2:53 AM  Result Value Ref Range   Lipase 28 11 - 51 U/L    Comment: Performed at [facility], [place] [address], [place], [place] [address]   *Note: Due to a large number of results and/or encounters for the requested time period, some results have not been displayed. A complete set of results can be found in Results Review.   CT ABDOMEN PELVIS WO CONTRAST  Result Date: 06/05/2022 CLINICAL DATA:  Abdominal pain. EXAM: CT ABDOMEN AND PELVIS WITHOUT CONTRAST TECHNIQUE: Multidetector CT imaging of the abdomen and pelvis was performed following the  standard protocol without IV contrast. RADIATION DOSE REDUCTION: This exam was performed according to the departmental dose-optimization program which includes automated exposure control, adjustment of the mA and/or kV according to patient size and/or use of iterative reconstruction technique. COMPARISON:  CTA abdomen and pelvis 05/23/2022, CT abdomen and pelvis with oral contrast only 03/27/2022 FINDINGS: Lower chest: There is interval new demonstration of a small layering left pleural effusion. There is overlying atelectasis. There is mild posterior atelectasis in the right lower lobe with a 2 cm pneumatocele again seen. Lung bases are otherwise unremarkable. The cardiac size is normal. There are calcifications in the coronary arteries. Hepatobiliary: There are stones in the gallbladder without wall thickening or bile duct dilatation. There are calcified granulomas in the liver. Capsular nodularity in the left lobe consistent with cirrhosis. No focal abnormality is seen without contrast. Pancreas: Atrophic and otherwise unremarkable without contrast. Spleen: There are multiple calcified granulomas. The spleen is slightly enlarged. There is a new subcapsular complex hypodense collection in the superior aspect of the spleen measuring 6 x 4.2 x 2 cm. Major differential concerns include a contained subcapsular hematoma and an infectious process. Hounsfield density is difficult to accurately characterize due to streak artifact from the patient's overlying arms, but measures between - 10 and -28 Hounsfield units if accurate. If this is a hematoma there is no  perisplenic hemorrhagic products. If this is an abscess, there is no air in it. Adrenals/Urinary Tract: Slight chronic nodular thickening of the adrenal glands. No new adrenal abnormality. Extensive renovascular calcifications at both renal hila are again shown with bilateral renal cortical thinning. Small renal cysts are similar. No new renal abnormality. No stones  or urinary obstruction. Unremarkable bladder. Stomach/Bowel: The stomach contracted and not well seen but no obvious wall thickening suspected. Small-bowel there are increased thickened folds in the left abdominal small bowel but there is no acute inflammatory change or obstruction. The appendix is normal caliber. There is sigmoid diverticulosis with increased wall thickening and inflammatory stranding involving the mid to distal thirds of the sigmoid colon consistent with acute diverticulitis. There is a large stool ball in the rectum not seen previously and scattered rectal wall pneumatosis. Some of the rectal stool is in the process of being expelled. Vascular/Lymphatic: There are extensive visceral arterial calcifications and moderate aortoiliac calcific plaques. This was described in detail in the recent CTA abdomen and pelvis report. There are mild mesenteric congestive features which have increased in the interval. No abdominal, pelvic or inguinal adenopathy is seen. Reproductive: Status post hysterectomy. No adnexal masses. Other: In addition to increased mesenteric congestion there is increased body wall anasarca. Minimal posterior pelvic ascites is unchanged. There is no free air, free hemorrhage or abscess. No incarcerated hernia. Musculoskeletal: There is osteopenia and degenerative change of the spine. No aggressive bone lesion is seen. IMPRESSION: 1. New 6 x 4.2 x 2 cm complex subcapsular collection in the superior aspect of the spleen. Major differential concerns include a contained subcapsular hematoma and an infectious process. If this is a hematoma there are no perisplenic hemorrhagic products. If this is an abscess, there is no air in it. 2. New small left pleural effusion with overlying atelectasis. 3. Increased mesenteric congestion and body wall anasarca. 4. Increased thickened folds in the left abdominal small bowel without acute inflammatory change. This could be due to mesenteric congestion  or enteritis. 5. Large stool ball in the rectum with scattered rectal wall pneumatosis, the latter which could indicate ischemic changes or be due to stercoral proctitis. 6. Sigmoid diverticulosis with increased wall thickening and inflammatory stranding in the mid to distal sigmoid consistent with acute diverticulitis. No free air or free hemorrhage. 7. Cholelithiasis. 8. Aortic and coronary artery atherosclerosis. 9. Capsular nodularity in the left lobe of liver consistent with cirrhosis. Aortic Atherosclerosis (ICD10-I70.0). Electronically Signed   By: Telford Nab M.D.   On: 06/05/2022 04:21    Pending Labs Unresulted Labs (From admission, onward)     Start     Ordered   06/06/22 0500  CBC  Daily,   R      06/05/22 0931   06/06/22 3976  Basic metabolic panel  Daily,   R      06/05/22 0931   06/06/22 0500  Renal function panel  Daily,   R      06/05/22 1047   06/05/22 0903  C Difficile Quick Screen w PCR reflex  (C Difficile quick screen w PCR reflex panel )  Once, for 24 hours,   URGENT       References:    CDiff Information Tool   06/05/22 0902   06/05/22 0900  Blood culture (routine x 2)  BLOOD CULTURE X 2,   R (with STAT occurrences)      06/05/22 0900   06/05/22 0238  Urinalysis, Routine w reflex microscopic  Once,  URGENT        06/05/22 0239            Vitals/Pain Today's Vitals   06/05/22 0915 06/05/22 0930 06/05/22 1000 06/05/22 1030  BP:  (!) 141/63 (!) 150/60 (!) 128/54  Pulse:  90 92 88  Resp:  '12 12 14  '$ Temp:      SpO2:  100% 100% 99%  PainSc: 10-Worst pain ever       Isolation Precautions Enteric precautions (UV disinfection)  Medications Medications  piperacillin-tazobactam (ZOSYN) IVPB 2.25 g (has no administration in time range)  amiodarone (PACERONE) tablet 200 mg (has no administration in time range)  midodrine (PROAMATINE) tablet 10 mg (has no administration in time range)  midodrine (PROAMATINE) tablet 5 mg (has no administration in time range)   levothyroxine (SYNTHROID) tablet 50 mcg (has no administration in time range)  heparin injection 5,000 Units (has no administration in time range)  sodium chloride flush (NS) 0.9 % injection 3 mL (has no administration in time range)  acetaminophen (TYLENOL) tablet 650 mg (has no administration in time range)    Or  acetaminophen (TYLENOL) suppository 650 mg (has no administration in time range)  albuterol (PROVENTIL) (2.5 MG/3ML) 0.083% nebulizer solution 2.5 mg (has no administration in time range)  fentaNYL (SUBLIMAZE) injection 50 mcg (50 mcg Intravenous Given 06/05/22 0322)  HYDROmorphone (DILAUDID) injection 0.5 mg (0.5 mg Intravenous Given 06/05/22 0915)    Mobility non-ambulatory High fall risk   Focused Assessments     R Recommendations: See Admitting Provider Note  Report given to:   Additional Notes:

## 2022-06-05 NOTE — H&P (Addendum)
History and Physical    Patient: Susan [name] OBS:[id number] DOB: [birth year] DOA: 06/05/2022 DOS: the patient was seen and examined on 06/05/2022 PCP: [name], MD  Patient coming from: [facility] rehab via EMS  Chief Complaint:  Chief Complaint  Patient presents with   Abdominal Pain   HPI: Susan [name] is a 72 y.o. female with medical history significant of HTN, HLD PAF not on anticoagulation, diastolic CHF, CAD, ESRD on HD(M/W/F), chronic respiratory failure on oxygen, DM type II, hypothyroidism, GI bleed, history of recurrent C. difficile colitis who presents with complaints of lower abdominal pain starting yesterday morning.  Pain was sharp and noted to be severe 10 out of 10.  Noted associated symptoms of nausea, 1 episode of vomiting,  diarrhea, and pain in her right.  Denies having any blood present in her stools.  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 Sharol Given.  She had dialysis yesterday and the plan was to have dialysis again on Saturday due to the planned surgery.  She had just recently been hospital 12/28-05/29/2022 after presenting with acute lower GI bleed.  GI was consulted and they tried to perform flexible sigmoidoscopy x 2 on 11/29 and 11/30 which were unsuccessful. General surgery was consulted and patient underwent exam under anesthesia with oversewing of rectal ulcer found on  05/24/2022 by Dr. Rich Number.  Patient had received a total of 4 units packed red blood cells and patient was recommended to discontinue Eliquis.  Patient reportedly was having diarrhea at that time,  but stool studies were negative for C. difficile.   In the emergency department patient was noted to be afebrile with tachypnea and O2 saturations maintained on home 3 L of oxygen.  Labs significant for hemoglobin 8.9, sodium 130, BUN 28, creatinine 2.43, and glucose 346.  CT scan of the abdomen pelvis with contrast noted 6 x 4.2 x 2 cm complex  subcapsular collection in the superior aspect of the spleen with concern for hematoma versus infectious process, new small left pleural effusion with atelectasis, increased mesenteric congestion and body wall anasarca, large stool ball in the rectum with scattered rectal wall pneumatosis,  sigmoid diverticulosis with acute diverticulitis, and capsular nodularity appearance of the left lobe of liver concerning for cirrhosis.  General surgery have been consulted.  Patient was started on empiric antibiotics of Zosyn and given a medication IV.  Review of Systems: As mentioned in the history of present illness. All other systems reviewed and are negative. Past Medical History:  Diagnosis Date   Acute MI ([place]) 1999; 2007; 03/05/21   Anemia    hx   Anginal pain (HCC)    Anxiety    ARF (acute renal failure) ([place]) 06/2017   [facility]   Arthritis    "generalized" (03/15/2014)   CAD (coronary artery disease)    MI in 2000 - MI  2007 - treated bare metal stent (no nuclear since then as 9/11)   Carotid artery disease (HCC)    CHF (congestive heart failure) ([place])    HFrEF 03/06/21   Chronic diastolic heart failure ([place])    a) ECHO (08/2013) EF 55-60% and RV function nl b) RHC (08/2013) RA 4, RV 30/5/7, PA 25/10 (16), PCWP 7, Fick CO/CI 6.3/2.7, PVR 1.5 WU, PA 61 and 66%   Daily headache    "~ every other day; since I fell in June" (03/15/2014)   Depression    Dyslipidemia  Dyspnea    uses oxygen qhs via [place]   ESRD (end stage renal disease) ([place])    Dialysis on Tues Thurs Sat   Exertional shortness of breath    History of kidney stones    HTN (hypertension)    Hypothyroidism    Neuropathy    Obesity    Osteoarthritis    PAF (paroxysmal atrial fibrillation) (HCC)    Peripheral neuropathy    bilateral feet/hands   PONV (postoperative nausea and vomiting)    RBBB (right bundle branch block)    Old   Stroke ([place])    mini strokes   Syncope    likely due to low blood sugar    Tachycardia    Sinus tachycardia   Type II diabetes mellitus (HCC)    Type II, Lamar Sprinkles libre left upper arm. patient has omnipod insulin pump with Novolin R Insulin   Urinary incontinence    Venous insufficiency    Past Surgical History:  Procedure Laterality Date   ABDOMINAL HYSTERECTOMY  1980's   AMPUTATION Right 02/24/2018   Procedure: RIGHT FOOT GREAT TOE AND 2ND TOE AMPUTATION;  Surgeon: Newt Minion, MD;  Location: [place];  Service: Orthopedics;  Laterality: Right;   AMPUTATION Right 04/30/2018   Procedure: RIGHT TRANSMETATARSAL AMPUTATION;  Surgeon: Newt Minion, MD;  Location: [place];  Service: Orthopedics;  Laterality: Right;   AMPUTATION Right 05/02/2022   Procedure: RIGHT BELOW KNEE AMPUTATION;  Surgeon: Newt Minion, MD;  Location: [place];  Service: Orthopedics;  Laterality: Right;   AV FISTULA PLACEMENT Left 04/02/2022   Procedure: LEFT ARM ARTERIOVENOUS (AV) FISTULA CREATION;  Surgeon: Serafina Mitchell, MD;  Location: [place];  Service: Vascular;  Laterality: Left;  PERIPHERAL NERVE BLOCK   BIOPSY  05/27/2020   Procedure: BIOPSY;  Surgeon: [name], DO;  [facility];  Service: Endoscopy;;   CATARACT EXTRACTION, BILATERAL Bilateral ?2013   COLONOSCOPY W/ POLYPECTOMY     COLONOSCOPY WITH PROPOFOL N/A 03/13/2019   Procedure: COLONOSCOPY WITH PROPOFOL;  Surgeon: Jerene Bears, MD;  Location: [place];  Service: Gastroenterology;  Laterality: N/A;   CORONARY ANGIOPLASTY WITH STENT PLACEMENT  1999; 2007   "1 + 1"   ERCP N/A 02/03/2022   Procedure: ENDOSCOPIC RETROGRADE CHOLANGIOPANCREATOGRAPHY (ERCP);  Surgeon: Carol Ada, MD;  Location: [place];  Service: Gastroenterology;  Laterality: N/A;   ESOPHAGOGASTRODUODENOSCOPY (EGD) WITH PROPOFOL N/A 03/13/2019   Procedure: ESOPHAGOGASTRODUODENOSCOPY (EGD) WITH PROPOFOL;  Surgeon: Jerene Bears, MD;  Location: [facility] ENDOSCOPY;  Service: Gastroenterology;  Laterality: N/A;   ESOPHAGOGASTRODUODENOSCOPY (EGD) WITH  PROPOFOL N/A 05/27/2020   Procedure: ESOPHAGOGASTRODUODENOSCOPY (EGD) WITH PROPOFOL;  Surgeon: [name], DO;  [facility];  Service: Endoscopy;  Laterality: N/A;   EYE SURGERY Bilateral    lazer   FLEXIBLE SIGMOIDOSCOPY N/A 05/23/2022   Procedure: FLEXIBLE SIGMOIDOSCOPY;  Surgeon: Carol Ada, MD;  Location: [place];  Service: Gastroenterology;  Laterality: N/A;   FLEXIBLE SIGMOIDOSCOPY N/A 05/24/2022   Procedure: FLEXIBLE SIGMOIDOSCOPY;  Surgeon: Sharyn Creamer, MD;  Location: [place];  Service: Gastroenterology;  Laterality: N/A;   HEMOSTASIS CLIP PLACEMENT  03/13/2019   Procedure: HEMOSTASIS CLIP PLACEMENT;  Surgeon: Jerene Bears, MD;  Location: [facility] ENDOSCOPY;  Service: Gastroenterology;;   HEMOSTASIS CLIP PLACEMENT  05/23/2022   Procedure: HEMOSTASIS CLIP PLACEMENT;  Surgeon: Carol Ada, MD;  Location: [facility] ENDOSCOPY;  Service: Gastroenterology;;   HEMOSTASIS CONTROL  05/24/2022   Procedure: HEMOSTASIS CONTROL;  Surgeon: Dayna Barker  C, MD;  Location: [place];  Service: Gastroenterology;;   HOT HEMOSTASIS N/A 05/23/2022   Procedure: HOT HEMOSTASIS (ARGON PLASMA COAGULATION/BICAP);  Surgeon: Carol Ada, MD;  Location: [place];  Service: Gastroenterology;  Laterality: N/A;   I & D EXTREMITY Left 05/05/2022   Procedure: IRRIGATION AND DEBRIDEMENT LEFT ARM AV FISTULA;  Surgeon: Marty Heck, MD;  Location: [place];  Service: Vascular;  Laterality: Left;   INSERTION OF DIALYSIS CATHETER Right 04/02/2022   Procedure: INSERTION OF TUNNELED DIALYSIS CATHETER;  Surgeon: Serafina Mitchell, MD;  Location: [facility] OR;  Service: Vascular;  Laterality: Right;   KNEE ARTHROSCOPY Left 10/25/2006   POLYPECTOMY  03/13/2019   Procedure: POLYPECTOMY;  Surgeon: Jerene Bears, MD;  Location: [place] ENDOSCOPY;  Service: Gastroenterology;;   REMOVAL OF STONES  02/03/2022   Procedure: REMOVAL OF STONES;  Surgeon: Carol Ada, MD;  Location: [place];  Service:  Gastroenterology;;   RIGHT HEART CATH N/A 07/24/2017   Procedure: RIGHT HEART CATH;  Surgeon: Jolaine Artist, MD;  Location: [place] CV LAB;  Service: Cardiovascular;  Laterality: N/A;   RIGHT HEART CATHETERIZATION N/A 09/22/2013   Procedure: RIGHT HEART CATH;  Surgeon: Jolaine Artist, MD;  Location: [facility] CATH LAB;  Service: Cardiovascular;  Laterality: N/A;   SHOULDER ARTHROSCOPY WITH OPEN ROTATOR CUFF REPAIR Right 03/14/2014   Procedure: RIGHT SHOULDER ARTHROSCOPY WITH BICEPS RELEASE, OPEN SUBSCAPULA REPAIR, OPEN SUPRASPINATUS REPAIR.;  Surgeon: Meredith Pel, MD;  Location: [place];  Service: Orthopedics;  Laterality: Right;   SPHINCTEROTOMY  02/03/2022   Procedure: SPHINCTEROTOMY;  Surgeon: Carol Ada, MD;  Location: [place];  Service: Gastroenterology;;   TEE WITHOUT CARDIOVERSION N/A 02/04/2022   Procedure: TRANSESOPHAGEAL ECHOCARDIOGRAM (TEE);  Surgeon: Jolaine Artist, MD;  Location: [facility] ENDOSCOPY;  Service: Cardiovascular;  Laterality: N/A;   TOE AMPUTATION Right 02/24/2018   GREAT TOE AND 2ND TOE AMPUTATION   TUBAL LIGATION  1970's   Social History:  reports that she quit smoking about 24 years ago. Her smoking use included cigarettes. She has a 96.00 pack-year smoking history. She has never used smokeless tobacco. She reports that she does not currently use alcohol. She reports that she does not use drugs.  Allergies  Allergen Reactions   Cephalexin Diarrhea and Other (See Comments)   Codeine Nausea And Vomiting and Other (See Comments)    Family History  Problem Relation Age of Onset   Heart attack Mother 28    Prior to Admission medications   Medication Sig Start Date End Date Taking? Authorizing Provider  albuterol (VENTOLIN HFA) 108 (90 Base) MCG/ACT inhaler Inhale 1-2 puffs into the lungs every 6 (six) hours as needed for wheezing or shortness of breath.    [provider]  amiodarone (PACERONE) 200 MG tablet Take 1 tablet (200 mg total) by  mouth daily. 01/29/22   Rafael Bihari, FNP  aspirin EC 81 MG tablet Take 1 tablet (81 mg total) by mouth daily with breakfast. 06/04/22   Darliss Cheney, MD  B Complex-C-Folic Acid (DIALYVITE TABLET) TABS Take 1 tablet by mouth in the morning. (1000) 04/22/22   [provider]  blood glucose meter kit and supplies Use up to four times daily as directed. 01/23/22   Ghimire, Henreitta Leber, MD  Continuous Blood Gluc Sensor (FREESTYLE LIBRE 2 SENSOR) MISC  01/14/22   [provider]  diphenoxylate-atropine (LOMOTIL) 2.5-0.025 MG tablet Take 1 tablet by mouth 4 (four) times daily as needed for diarrhea or loose stools.  08/19/21   [name], MD  ferrous sulfate 325 (65 FE) MG tablet Take 325 mg by mouth 2 (two) times daily. (1000 & 2100)    [provider]  fidaxomicin (DIFICID) 200 MG TABS tablet Take 1 tablet (200 mg total) by mouth 2 (two) times daily. Patient not taking: Reported on 06/04/2022 03/28/22   Rosiland Oz, MD  Fingerstix Lancets MISC Use as directed to check blood sugars as need up to 4 times daily 01/23/22   Ghimire, Henreitta Leber, MD  folic acid-vitamin b complex-vitamin c-selenium-zinc (DIALYVITE) 3 MG TABS tablet Take 1 tablet by mouth daily.    [provider]  hydrALAZINE (APRESOLINE) 25 MG tablet Take 1 tablet (25 mg total) by mouth every 6 (six) hours as needed (SBP>150 or DBP>100). 04/06/22   Nita Sells, MD  hydrocortisone (ANUSOL-HC) 2.5 % rectal cream Place 1 Application rectally 2 (two) times daily. (1000 & 2100)    [provider]  insulin aspart (NOVOLOG FLEXPEN) 100 UNIT/ML FlexPen Inject 0-15 Units into the skin See admin instructions. Inject 5 units subcutaneously 3 times daily with meals (0900, 1300 & 1700) & as needed via sliding scale insulin CBG <70=Notify NP/MD 71-149=0 units,150-199=4 units, 200-249=6 units,250- 299=10 units, 300-349=12 units, 350-399=15 units, > 400 call MD ( prime pen with 2 units prior  to set  dose)    [provider]  insulin glargine (LANTUS SOLOSTAR) 100 UNIT/ML Solostar Pen Inject 10 Units into the skin at bedtime.    [provider]  Insulin Pen Needle 32G X 4 MM MISC Use as directed 01/23/22   Ghimire, Henreitta Leber, MD  levothyroxine (SYNTHROID, LEVOTHROID) 50 MCG tablet Take 1 tablet (50 mcg total) by mouth daily before breakfast. 11/07/16   [name], PA-C  linagliptin (TRADJENTA) 5 MG TABS tablet Take 1 tablet (5 mg total) by mouth daily. 04/07/22   Nita Sells, MD  magnesium oxide (MAG-OX) 400 (240 Mg) MG tablet Take 1 tablet (400 mg total) by mouth daily. 07/18/21   [name], MD  midodrine (PROAMATINE) 10 MG tablet Take 1 tablet (10 mg total) by mouth every Monday, Wednesday, and Friday with hemodialysis. 05/30/22 06/29/22  Darliss Cheney, MD  midodrine (PROAMATINE) 5 MG tablet Take 1 tablet (5 mg total) by mouth 3 (three) times daily with meals. 05/28/22 06/27/22  Darliss Cheney, MD  Nutritional Supplements (FEEDING SUPPLEMENT, GLUCERNA 1.2 CAL,) LIQD Take 237 mLs by mouth daily.    [provider]  ondansetron (ZOFRAN) 4 MG tablet Take 4 mg by mouth every 8 (eight) hours as needed for nausea.    [provider]  oxyCODONE-acetaminophen (PERCOCET/ROXICET) 5-325 MG tablet Take 1 tablet by mouth every 4 (four) hours as needed. 05/28/22   Darliss Cheney, MD  OXYGEN Inhale 2 L/min into the lungs at bedtime.    [provider]  pantoprazole (PROTONIX) 40 MG tablet Take 1 tablet (40 mg total) by mouth daily. 05/28/22 05/28/23  Darliss Cheney, MD  polyethylene glycol (MIRALAX / GLYCOLAX) 17 g packet Take 17 g by mouth in the morning. (1000)    [provider]  pregabalin (LYRICA) 75 MG capsule Take 1 capsule (75 mg total) by mouth daily. 04/07/22   Nita Sells, MD  saccharomyces boulardii (FLORASTOR) 250 MG capsule Take 500 mg by mouth 2 (two) times daily. X14 days    [provider]  senna-docusate (SENOKOT-S)  8.6-50 MG tablet Take 2 tablets by mouth at bedtime. (2200)    [provider]  sevelamer carbonate (RENVELA) 800 MG tablet Take 1,600 mg by mouth 3 (three) times daily with meals. (0800, 1200 & 1700)    [provider]  vortioxetine HBr (TRINTELLIX) 10 MG TABS tablet Take 2 tablets (20 mg total) by mouth daily. Patient not taking: Reported on 06/04/2022 04/23/22   [name], MD  vortioxetine HBr (TRINTELLIX) 20 MG TABS tablet Take 20 mg by mouth in the morning. (1000)    [provider]    Physical Exam: Vitals:   06/05/22 0247 06/05/22 0650 06/05/22 0845  BP: (!) 117/50 (!) 123/35 (!) 141/58  Pulse: 88 69 84  Resp: (!) 24 (!) 26 20  Temp: 97.6 F (36.4 C) 97.8 F (36.6 C)   SpO2: 95% 100% 100%  Exam  Constitutional: Chronically ill-appearing obese elderly female Eyes: PERRL, lids and conjunctivae normal ENMT: Mucous membranes are moist.   Neck: normal, supple  Respiratory: Normal respiratory effort without significant wheeze appreciated at this time.  Patient on 3 L nasal cannula oxygen with O2 saturations maintained. Cardiovascular: Regular rate and rhythm.  At least 2+ pitting left lower extremity edema.  Left upper extremity fistula in place.  Hemodialysis catheter of the right chest wall. Abdomen: Tenderness palpation of the lower abdomen.  Bowel sounds appreciated. Musculoskeletal: no clubbing / cyanosis.  Status post right BKA currently wrapped. Skin: Reviewed pictures from [name] office visit  1/9 of right BKA stump wound dehiscence. Neurologic: CN 2-12 grossly intact.  Able to move all extremities. Psychiatric: Normal judgment and insight. Alert and oriented x 3. Normal mood.   Data Reviewed:  EKG reveals sinus rhythm with premature supraventricular complexes left axis deviation with QTc 538.  Reviewed labs, imaging and pertinent records as noted above in HPI.  Assessment and Plan: Stercoral Colitis Sigmoid diverticulitis C.  difficile Patient presented with complaints of lower abdominal pain with nausea, vomiting, and diarrhea.  CT noted large stool ball in the rectum with scattered rectal wall pneumatosis concerning for stercoral colitis and note of sigmoid diverticulosis with acute diverticulitis. General surgery has been consulted and ordered tocolyse suppository -Admit to a telemetry bed -Follow-up blood cultures -Follow-up C. difficile testing(positive oral vancomycin started) -Continue empiric antibiotics of Zosyn  Perisplenic fluid collection Patient denied any recent falls or trauma.  CT scan of the abdomen pelvis with contrast noted 6 x 4.2 x 2 cm complex subcapsular collection in the superior aspect of the spleen with concern for hematoma versus infectious process.  -Continue Zosyn -Appreciate general surgery consultative services we will follow-up for any further recommendations  Uncontrolled Diabetes mellitus type 2 with hyperglycemia On admission glucose elevated at 346.  Last available hemoglobin A1c was 8.9 on 05/02/2022.  Home medication regimen includes Lantus 10 units nightly, Tradjenta 5 units -Hypoglycemia protocols -Semglee 20 units daily -CBGs before every meal with moderate SSI -NovoLog 0 to 5 units nightly -Appreciate diabetic education  ESRD on HD Normally dialyzes Monday/Wednesday/Friday.  Patient last dialyzed on 1/10. -Dr. Hollie Salk of nephrology consulted, follow-up for any further recommendations  Paroxysmal atrial fibrillation Patient appears to be in sinus rhythm at this time.  No longer on anticoagulation after recent GI bleed with rectal ulcer. -Amiodarone and aspirin  Wound dehiscence, surgical S/p right BKA Patient was scheduled to have revision of prior BKA due to wound dehiscence with Dr. Sharol Given at home.  Dr. Sharol Given informed of patient admission and surgery has been canceled for tomorrow.  Anemia of chronic disease Hemoglobin 8.9 which appears around patient's  baseline. -Recheck H&H  Prolonged QT interval Chronic. QTc 538. -Avoid QT prolonging medications  Hypothyroidism -Continue levothyroxine  History of GI bleed Patient presented to the hospital last month with GI bleed.  Ultimately underwent exam under anesthesia with oversewing of rectal ulcer found on 05/24/2022 by Dr. Rich Number.  DVT Prophylaxis:Heparin Advance Care Planning:   Code Status: Full Code    Consults: Surgery  Family Communication: Daughter updated over the phone  Severity of Illness: The appropriate patient status for this patient is INPATIENT. Inpatient status is judged to be reasonable and necessary in order to provide the required intensity of service to ensure the patient's safety. The patient's presenting symptoms, physical exam findings, and initial radiographic and laboratory data in the context of their chronic comorbidities is felt to place them at high risk for further clinical deterioration. Furthermore, it is not anticipated that the patient will be medically stable for discharge from the hospital within 2 midnights of admission.   * I certify that at the point of admission it is my clinical judgment that the patient will require inpatient hospital care spanning beyond 2 midnights from the point of admission due to high intensity of service, high risk for further deterioration and high frequency of surveillance required.*  Author: Norval Morton, MD 06/05/2022 9:12 AM  For on call review [email or web address].

## 2022-06-05 NOTE — ED Provider Notes (Signed)
[facility] EMERGENCY DEPARTMENT Provider Note   CSN: [id number] Arrival date & time: 06/05/22  3474     History  Chief Complaint  Patient presents with   Abdominal Pain    Susan [name] is a 72 y.o. female.  HPI 72 year old female with a complex past medical history including CHF, CAD, ESRD on dialysis, paroxysmal A-fib but not on anticoagulation, chronic anemia, type 2 diabetes presents with abdominal pain and diarrhea.  Symptoms started around 5 AM on 1/10.  She has had primarily lower abdominal pain as well as diarrhea.  She thinks there might been some blood in it.  No fevers that she knows of.  She did have a couple episodes of vomiting.  Right now the pain is rated as severe.  No recent falls.  Has a prior history of C. difficile and is worried about this.  Home Medications Prior to Admission medications   Medication Sig Start Date End Date Taking? Authorizing Provider  albuterol (VENTOLIN HFA) 108 (90 Base) MCG/ACT inhaler Inhale 1-2 puffs into the lungs every 6 (six) hours as needed for wheezing or shortness of breath.    [provider]  amiodarone (PACERONE) 200 MG tablet Take 1 tablet (200 mg total) by mouth daily. 01/29/22   Rafael Bihari, FNP  aspirin EC 81 MG tablet Take 1 tablet (81 mg total) by mouth daily with breakfast. 06/04/22   Darliss Cheney, MD  B Complex-C-Folic Acid (DIALYVITE TABLET) TABS Take 1 tablet by mouth in the morning. (1000) 04/22/22   [provider]  blood glucose meter kit and supplies Use up to four times daily as directed. 01/23/22   Ghimire, Henreitta Leber, MD  Continuous Blood Gluc Sensor (FREESTYLE LIBRE 2 SENSOR) MISC  01/14/22   [provider]  diphenoxylate-atropine (LOMOTIL) 2.5-0.025 MG tablet Take 1 tablet by mouth 4 (four) times daily as needed for diarrhea or loose stools. 08/19/21   [name], MD  ferrous sulfate 325 (65 FE) MG tablet Take 325 mg by mouth 2 (two) times daily. (1000 & 2100)     [provider]  fidaxomicin (DIFICID) 200 MG TABS tablet Take 1 tablet (200 mg total) by mouth 2 (two) times daily. Patient not taking: Reported on 06/04/2022 03/28/22   Rosiland Oz, MD  Fingerstix Lancets MISC Use as directed to check blood sugars as need up to 4 times daily 01/23/22   Ghimire, Henreitta Leber, MD  folic acid-vitamin b complex-vitamin c-selenium-zinc (DIALYVITE) 3 MG TABS tablet Take 1 tablet by mouth daily.    [provider]  hydrALAZINE (APRESOLINE) 25 MG tablet Take 1 tablet (25 mg total) by mouth every 6 (six) hours as needed (SBP>150 or DBP>100). 04/06/22   Nita Sells, MD  hydrocortisone (ANUSOL-HC) 2.5 % rectal cream Place 1 Application rectally 2 (two) times daily. (1000 & 2100)    [provider]  insulin aspart (NOVOLOG FLEXPEN) 100 UNIT/ML FlexPen Inject 0-15 Units into the skin See admin instructions. Inject 5 units subcutaneously 3 times daily with meals (0900, 1300 & 1700) & as needed via sliding scale insulin CBG <70=Notify NP/MD 71-149=0 units,150-199=4 units, 200-249=6 units,250- 299=10 units, 300-349=12 units, 350-399=15 units, > 400 call MD ( prime pen with 2 units prior  to set dose)    [provider]  insulin glargine (LANTUS SOLOSTAR) 100 UNIT/ML Solostar Pen Inject 10 Units into the skin at bedtime.    [provider]  Insulin Pen Needle 32G X 4 MM MISC Use  as directed 01/23/22   Jonetta Osgood, MD  levothyroxine (SYNTHROID, LEVOTHROID) 50 MCG tablet Take 1 tablet (50 mcg total) by mouth daily before breakfast. 11/07/16   [name], PA-C  linagliptin (TRADJENTA) 5 MG TABS tablet Take 1 tablet (5 mg total) by mouth daily. 04/07/22   Nita Sells, MD  magnesium oxide (MAG-OX) 400 (240 Mg) MG tablet Take 1 tablet (400 mg total) by mouth daily. 07/18/21   [name], MD  midodrine (PROAMATINE) 10 MG tablet Take 1 tablet (10 mg total) by mouth every Monday, Wednesday, and Friday with  hemodialysis. 05/30/22 06/29/22  Darliss Cheney, MD  midodrine (PROAMATINE) 5 MG tablet Take 1 tablet (5 mg total) by mouth 3 (three) times daily with meals. 05/28/22 06/27/22  Darliss Cheney, MD  Nutritional Supplements (FEEDING SUPPLEMENT, GLUCERNA 1.2 CAL,) LIQD Take 237 mLs by mouth daily.    [provider]  ondansetron (ZOFRAN) 4 MG tablet Take 4 mg by mouth every 8 (eight) hours as needed for nausea.    [provider]  oxyCODONE-acetaminophen (PERCOCET/ROXICET) 5-325 MG tablet Take 1 tablet by mouth every 4 (four) hours as needed. 05/28/22   Darliss Cheney, MD  OXYGEN Inhale 2 L/min into the lungs at bedtime.    [provider]  pantoprazole (PROTONIX) 40 MG tablet Take 1 tablet (40 mg total) by mouth daily. 05/28/22 05/28/23  Darliss Cheney, MD  polyethylene glycol (MIRALAX / GLYCOLAX) 17 g packet Take 17 g by mouth in the morning. (1000)    [provider]  pregabalin (LYRICA) 75 MG capsule Take 1 capsule (75 mg total) by mouth daily. 04/07/22   Nita Sells, MD  saccharomyces boulardii (FLORASTOR) 250 MG capsule Take 500 mg by mouth 2 (two) times daily. X14 days    [provider]  senna-docusate (SENOKOT-S) 8.6-50 MG tablet Take 2 tablets by mouth at bedtime. (2200)    [provider]  sevelamer carbonate (RENVELA) 800 MG tablet Take 1,600 mg by mouth 3 (three) times daily with meals. (0800, 1200 & 1700)    [provider]  vortioxetine HBr (TRINTELLIX) 10 MG TABS tablet Take 2 tablets (20 mg total) by mouth daily. Patient not taking: Reported on 06/04/2022 04/23/22   [name], MD  vortioxetine HBr (TRINTELLIX) 20 MG TABS tablet Take 20 mg by mouth in the morning. (1000)    [provider]      Allergies    Cephalexin and Codeine    Review of Systems   Review of Systems  Constitutional:  Negative for fever.  Gastrointestinal:  Positive for abdominal pain, diarrhea and vomiting.    Physical Exam Updated Vital  Signs BP (!) 141/58   Pulse 84   Temp 97.8 F (36.6 C)   Resp 20   SpO2 100%  Physical Exam Vitals and nursing note reviewed. Exam conducted with a chaperone present.  Constitutional:      Appearance: She is well-developed.  HENT:     Head: Normocephalic and atraumatic.  Pulmonary:     Effort: Pulmonary effort is normal.  Abdominal:     Palpations: Abdomen is soft.     Tenderness: There is generalized abdominal tenderness (worst in lower abdomen).  Genitourinary:    Rectum: External hemorrhoid present.     Comments: No gross blood on rectal exam.  Patient has a large amount of diarrhea in her diaper and then soft stool on rectal exam.  There is no fecal impaction. Skin:    General:  Skin is warm and dry.  Neurological:     Mental Status: She is alert.     ED Results / Procedures / Treatments   Labs (all labs ordered are listed, but only abnormal results are displayed) Labs Reviewed  CBC WITH DIFFERENTIAL/PLATELET - Abnormal; Notable for the following components:      Result Value   RBC 3.30 (*)    Hemoglobin 8.9 (*)    HCT 29.1 (*)    RDW 19.3 (*)    Neutro Abs 8.6 (*)    Abs Immature Granulocytes 0.09 (*)    All other components within normal limits  COMPREHENSIVE METABOLIC PANEL - Abnormal; Notable for the following components:   Sodium 130 (*)    Chloride 93 (*)    Glucose, Bld 346 (*)    BUN 28 (*)    Creatinine, Ser 2.43 (*)    Calcium 8.4 (*)    Total Protein 5.8 (*)    Albumin 2.0 (*)    AST 11 (*)    GFR, Estimated 21 (*)    All other components within normal limits  CULTURE, BLOOD (ROUTINE X 2)  CULTURE, BLOOD (ROUTINE X 2)  C DIFFICILE QUICK SCREEN W PCR REFLEX    LIPASE, BLOOD  URINALYSIS, ROUTINE W REFLEX MICROSCOPIC    EKG None  Radiology CT ABDOMEN PELVIS WO CONTRAST  Result Date: 06/05/2022 CLINICAL DATA:  Abdominal pain. EXAM: CT ABDOMEN AND PELVIS WITHOUT CONTRAST TECHNIQUE: Multidetector CT imaging of the abdomen and pelvis was  performed following the standard protocol without IV contrast. RADIATION DOSE REDUCTION: This exam was performed according to the departmental dose-optimization program which includes automated exposure control, adjustment of the mA and/or kV according to patient size and/or use of iterative reconstruction technique. COMPARISON:  CTA abdomen and pelvis 05/23/2022, CT abdomen and pelvis with oral contrast only 03/27/2022 FINDINGS: Lower chest: There is interval new demonstration of a small layering left pleural effusion. There is overlying atelectasis. There is mild posterior atelectasis in the right lower lobe with a 2 cm pneumatocele again seen. Lung bases are otherwise unremarkable. The cardiac size is normal. There are calcifications in the coronary arteries. Hepatobiliary: There are stones in the gallbladder without wall thickening or bile duct dilatation. There are calcified granulomas in the liver. Capsular nodularity in the left lobe consistent with cirrhosis. No focal abnormality is seen without contrast. Pancreas: Atrophic and otherwise unremarkable without contrast. Spleen: There are multiple calcified granulomas. The spleen is slightly enlarged. There is a new subcapsular complex hypodense collection in the superior aspect of the spleen measuring 6 x 4.2 x 2 cm. Major differential concerns include a contained subcapsular hematoma and an infectious process. Hounsfield density is difficult to accurately characterize due to streak artifact from the patient's overlying arms, but measures between - 10 and -28 Hounsfield units if accurate. If this is a hematoma there is no perisplenic hemorrhagic products. If this is an abscess, there is no air in it. Adrenals/Urinary Tract: Slight chronic nodular thickening of the adrenal glands. No new adrenal abnormality. Extensive renovascular calcifications at both renal hila are again shown with bilateral renal cortical thinning. Small renal cysts are similar. No new  renal abnormality. No stones or urinary obstruction. Unremarkable bladder. Stomach/Bowel: The stomach contracted and not well seen but no obvious wall thickening suspected. Small-bowel there are increased thickened folds in the left abdominal small bowel but there is no acute inflammatory change or obstruction. The appendix is normal caliber. There is sigmoid diverticulosis with  increased wall thickening and inflammatory stranding involving the mid to distal thirds of the sigmoid colon consistent with acute diverticulitis. There is a large stool ball in the rectum not seen previously and scattered rectal wall pneumatosis. Some of the rectal stool is in the process of being expelled. Vascular/Lymphatic: There are extensive visceral arterial calcifications and moderate aortoiliac calcific plaques. This was described in detail in the recent CTA abdomen and pelvis report. There are mild mesenteric congestive features which have increased in the interval. No abdominal, pelvic or inguinal adenopathy is seen. Reproductive: Status post hysterectomy. No adnexal masses. Other: In addition to increased mesenteric congestion there is increased body wall anasarca. Minimal posterior pelvic ascites is unchanged. There is no free air, free hemorrhage or abscess. No incarcerated hernia. Musculoskeletal: There is osteopenia and degenerative change of the spine. No aggressive bone lesion is seen. IMPRESSION: 1. New 6 x 4.2 x 2 cm complex subcapsular collection in the superior aspect of the spleen. Major differential concerns include a contained subcapsular hematoma and an infectious process. If this is a hematoma there are no perisplenic hemorrhagic products. If this is an abscess, there is no air in it. 2. New small left pleural effusion with overlying atelectasis. 3. Increased mesenteric congestion and body wall anasarca. 4. Increased thickened folds in the left abdominal small bowel without acute inflammatory change. This could be  due to mesenteric congestion or enteritis. 5. Large stool ball in the rectum with scattered rectal wall pneumatosis, the latter which could indicate ischemic changes or be due to stercoral proctitis. 6. Sigmoid diverticulosis with increased wall thickening and inflammatory stranding in the mid to distal sigmoid consistent with acute diverticulitis. No free air or free hemorrhage. 7. Cholelithiasis. 8. Aortic and coronary artery atherosclerosis. 9. Capsular nodularity in the left lobe of liver consistent with cirrhosis. Aortic Atherosclerosis (ICD10-I70.0). Electronically Signed   By: Telford Nab M.D.   On: 06/05/2022 04:21    Procedures Procedures    Medications Ordered in ED Medications  piperacillin-tazobactam (ZOSYN) IVPB 2.25 g (has no administration in time range)  fentaNYL (SUBLIMAZE) injection 50 mcg (50 mcg Intravenous Given 06/05/22 0322)  HYDROmorphone (DILAUDID) injection 0.5 mg (0.5 mg Intravenous Given 06/05/22 0915)    ED Course/ Medical Decision Making/ A&P                           Medical Decision Making Amount and/or Complexity of Data Reviewed Labs:     Details: WBC is at the upper limits of normal but does have elevated absolute neutrophil count.  CMP consistent with her CKD. Radiology: independent interpretation performed.    Details: CT shows a subcapsular hematoma of the spleen.  There is also a fair amount of stool in the rectum.  Risk Prescription drug management. Decision regarding hospitalization.   Patient is overall found to have a hematoma versus abscess of her spleen.  Clinically she is not ill-appearing or unstable besides her abdominal pain.  She was given IV Dilaudid for this.  Is unclear if this is a hematoma given no trauma and she is not on blood thinners anymore.  Will start IV antibiotics in case this is abscess.  I have discussed with general surgery who will consult.  However she will need a medical admission and I discussed with Dr. Tamala Julian.  From  a stool perspective, I did a rectal exam which shows no fecal impaction despite the CT images and I think this was just  loose stool in her rectum.  Given she truly seems to be having diarrhea we will send for C. difficile given her history of this relatively recently a couple months ago.        Final Clinical Impression(s) / ED Diagnoses Final diagnoses:  Spleen hematoma, initial encounter    Rx / DC Orders ED Discharge Orders     None         Sherwood Gambler, MD 06/05/22 1000

## 2022-06-05 NOTE — ED Triage Notes (Signed)
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 3L [place] at baseline.   Dialysis M, W F. With Left arm restriction.   Lower abdominal pain ~2 pm yesterday. Says pain is similar to when she had c-diff. Has been off enteric precautions x 1 week. Not currently having any diarrhea.   Facility just admin 1 Norco prior to calling paramedics.

## 2022-06-05 NOTE — ED Notes (Signed)
Patient transported to CT  New oxygen tank provided

## 2022-06-05 NOTE — Telephone Encounter (Signed)
I called and advised pt's daughter that Dr. Sharol Given would reach out after the pm clinic. Do not want her to worry that she would not get a return call today. Advised that Dr. Sharol Given has looked over pt's work up so far while in ER and did not believe that she would be able to proceed with surgery tomorrow but he would talk with them later on today.

## 2022-06-05 NOTE — Plan of Care (Signed)
Patient transitioned from ED to 57M Room 01. Patient is alert, oriented, and able to voice needs. Assistance provided with ADLs as needed. Patient is incontinent of bowel and bladder. Patient reports she is oliguric. Fecal tube has been placed due to consistency and frequency of fecal matter. Additionally, patient's buttocks are red, inflamed, and tender to touch. Both patient and daughter approved of this intervention. Sacral foam dressing placed to buttocks to reduce risk of skin breakdown. PRN pain medication was given to assist with abdominal discomfort. O2 @ 3L nasal cannula in place to keep oxygen saturation level > 90%. Patient remains NPO except sips with meds per order. Enteric precautions remain in place. Call bell within reach. Bed in lowest position with functional bed alarm in place. Will continue to monitor.      Problem: Education: Goal: Knowledge of General Education information will improve Description: Including pain rating scale, medication(s)/side effects and non-pharmacologic comfort measures Outcome: Progressing   Problem: Clinical Measurements: Goal: Ability to maintain clinical measurements within normal limits will improve Outcome: Progressing Goal: Will remain free from infection Outcome: Progressing Goal: Diagnostic test results will improve Outcome: Progressing Goal: Respiratory complications will improve Outcome: Progressing Goal: Cardiovascular complication will be avoided Outcome: Progressing   Problem: Coping: Goal: Level of anxiety will decrease Outcome: Progressing   Problem: Elimination: Goal: Will not experience complications related to bowel motility Outcome: Progressing   Problem: Pain Managment: Goal: General experience of comfort will improve Outcome: Progressing   Problem: Safety: Goal: Ability to remain free from injury will improve Outcome: Progressing   Problem: Skin Integrity: Goal: Risk for impaired skin integrity will  decrease Outcome: Progressing

## 2022-06-05 NOTE — ED Notes (Signed)
Pt states she makes very little urine and she already urinated in her brief for today

## 2022-06-05 NOTE — ED Notes (Signed)
Pt was incontinent of large loose BM. I cleaned pt, applied a clean brief and applied barrier cream to buttocks. Pt's buttocks was erythematous.

## 2022-06-05 NOTE — Telephone Encounter (Signed)
Dr. Sharol Given is aware and will contact pt's daughter after pm clinic.

## 2022-06-05 NOTE — ED Notes (Signed)
Pt audibly moaning and groaning out in pain.

## 2022-06-05 NOTE — ED Provider Triage Note (Signed)
Emergency Medicine Provider Triage Evaluation Note  Susan [name] , a 72 y.o. female  was evaluated in triage.  Pt complains of abdominal pain, states it feels like when she had c. Diff.  Denies vomiting.  No dysuria/frequency.  Not currently having diarrhea.  Currently at [facility] after right BKA.  Facility gave norco PTA without relief.  Hx ESRD, MWF schedule.  Review of Systems  Positive: Abdominal pain Negative: fever  Physical Exam  BP (!) 117/50   Pulse 88   Temp 97.6 F (36.4 C)   Resp (!) 24   SpO2 95%   Gen:   Awake, no distress   Resp:  Normal effort  MSK:   Moves extremities without difficulty  Other:  On 3L O2 (baseline)  Medical Decision Making  Medically screening exam initiated at 2:30 AM.  Appropriate orders placed.  Amyre Segundo was informed that the remainder of the evaluation will be completed by another provider, this initial triage assessment does not replace that evaluation, and the importance of remaining in the ED until their evaluation is complete.  Abdominal pain, feels like when she had C. Diff.  No current diarrhea.  Labs, CT.   Larene Pickett, PA-C 06/05/22 [phone]

## 2022-06-05 NOTE — Inpatient Diabetes Management (Signed)
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 CBG at that time was 346 mg/dl.   Recommend starting Semglee 20 units daily, Novolog 0-15 units every 4 hours if NPO and then TID  when eating, Novolog 0-5 units HS scale. Titrate dosages as needed.  Patient needs basal and bolus insulin regimen. Patient is familiar to our team and has been seen numerous times in her last few admissions.   Harvel Ricks RN BSN CDE Diabetes Coordinator Pager: [phone]  8am-5pm

## 2022-06-05 NOTE — Progress Notes (Signed)
Pharmacy Antibiotic Note  Susan [name] is a 72 y.o. female admitted on 06/05/2022 with  intra-abdominal infection . CT abdomen with subcapsular collection in the superior aspect of the spleen, acute diverticulitis, and cholelithiasis. Pt w/ ESRD on HD MWF. Note, pt does have a history of C Difficile infection. Pharmacy has been consulted for Zosyn (piperacillin-tazobactam) dosing.  Pt received metronidazole '500mg'$  IV x1 in ED at 09:23 on 1/11.   WBC 10.4, afebrile  Plan: Initiate Zosyn 2.25g IV q8h for dosing with ESRD on HD Monitor daily CBC, temp, SCr, and for clinical signs of improvement  F/u cultures, CDI PCR, and de-escalate antibiotics as able  F/u surgical plans    Temp (24hrs), Avg:97.7 F (36.5 C), Min:97.6 F (36.4 C), Max:97.8 F (36.6 C)  Recent Labs  Lab 06/05/22 0253  WBC 10.4  CREATININE 2.43*    Estimated Creatinine Clearance: 25.3 mL/min (A) (by C-G formula based on SCr of 2.43 mg/dL (H)).    Allergies  Allergen Reactions   Cephalexin Diarrhea and Other (See Comments)   Codeine Nausea And Vomiting and Other (See Comments)    Antimicrobials this admission: Metronidazole 1/11 x1 Zosyn 1/11 >>   Dose adjustments this admission: N/A  Microbiology results: 1/11 BCx: pending 1/11 C Difficile PCR: pending  Thank you for allowing pharmacy to be a part of this patient's care.  Luisa Hart, PharmD, BCPS Clinical Pharmacist 06/05/2022 9:40 AM   Please refer to [facility] for pharmacy phone number

## 2022-06-05 NOTE — Telephone Encounter (Signed)
Pt's daughter [name] called stating that the pt is suppose to have surgery tomorrow with Dr Sharol Given but pt has been admitted to hospital. She states pt was admitted for fluid on spline, diaherra, and weakness. Pt has not food or fluids since last night and pt's daughter is unsure if Dr Sharol Given will still do surgery tomorrow. Please call [name] at [phone] as soon as possible.

## 2022-06-06 ENCOUNTER — Inpatient Hospital Stay (HOSPITAL_COMMUNITY): Admission: RE | Admit: 2022-06-06 | Payer: PPO | Source: Home / Self Care | Admitting: Orthopedic Surgery

## 2022-06-06 ENCOUNTER — Encounter (HOSPITAL_COMMUNITY): Admission: RE | Payer: Self-pay | Source: Home / Self Care

## 2022-06-06 DIAGNOSIS — K5792 Diverticulitis of intestine, part unspecified, without perforation or abscess without bleeding: Secondary | ICD-10-CM | POA: Diagnosis not present

## 2022-06-06 DIAGNOSIS — S36029A Unspecified contusion of spleen, initial encounter: Secondary | ICD-10-CM

## 2022-06-06 DIAGNOSIS — T8130XA Disruption of wound, unspecified, initial encounter: Secondary | ICD-10-CM

## 2022-06-06 DIAGNOSIS — Z8619 Personal history of other infectious and parasitic diseases: Secondary | ICD-10-CM | POA: Diagnosis not present

## 2022-06-06 DIAGNOSIS — T8781 Dehiscence of amputation stump: Secondary | ICD-10-CM

## 2022-06-06 DIAGNOSIS — S88111A Complete traumatic amputation at level between knee and ankle, right lower leg, initial encounter: Secondary | ICD-10-CM

## 2022-06-06 DIAGNOSIS — K5289 Other specified noninfective gastroenteritis and colitis: Secondary | ICD-10-CM | POA: Diagnosis not present

## 2022-06-06 DIAGNOSIS — Z89011 Acquired absence of right thumb: Secondary | ICD-10-CM

## 2022-06-06 LAB — CBC
HCT: 26.5 % — ABNORMAL LOW (ref 36.0–46.0)
Hemoglobin: 8.3 g/dL — ABNORMAL LOW (ref 12.0–15.0)
MCH: 27.4 pg (ref 26.0–34.0)
MCHC: 31.3 g/dL (ref 30.0–36.0)
MCV: 87.5 fL (ref 80.0–100.0)
Platelets: 187 10*3/uL (ref 150–400)
RBC: 3.03 MIL/uL — ABNORMAL LOW (ref 3.87–5.11)
RDW: 19.4 % — ABNORMAL HIGH (ref 11.5–15.5)
WBC: 10.7 10*3/uL — ABNORMAL HIGH (ref 4.0–10.5)
nRBC: 0 % (ref 0.0–0.2)

## 2022-06-06 LAB — RENAL FUNCTION PANEL
Albumin: 1.6 g/dL — ABNORMAL LOW (ref 3.5–5.0)
Anion gap: 12 (ref 5–15)
BUN: 37 mg/dL — ABNORMAL HIGH (ref 8–23)
CO2: 26 mmol/L (ref 22–32)
Calcium: 8.1 mg/dL — ABNORMAL LOW (ref 8.9–10.3)
Chloride: 93 mmol/L — ABNORMAL LOW (ref 98–111)
Creatinine, Ser: 3 mg/dL — ABNORMAL HIGH (ref 0.44–1.00)
GFR, Estimated: 16 mL/min — ABNORMAL LOW (ref >60)
Glucose, Bld: 193 mg/dL — ABNORMAL HIGH (ref 70–99)
Phosphorus: 2.6 mg/dL (ref 2.5–4.6)
Potassium: 3.8 mmol/L (ref 3.5–5.1)
Sodium: 131 mmol/L — ABNORMAL LOW (ref 135–145)

## 2022-06-06 LAB — BASIC METABOLIC PANEL
Anion gap: 11 (ref 5–15)
BUN: 38 mg/dL — ABNORMAL HIGH (ref 8–23)
CO2: 26 mmol/L (ref 22–32)
Calcium: 8.1 mg/dL — ABNORMAL LOW (ref 8.9–10.3)
Chloride: 93 mmol/L — ABNORMAL LOW (ref 98–111)
Creatinine, Ser: 2.99 mg/dL — ABNORMAL HIGH (ref 0.44–1.00)
GFR, Estimated: 16 mL/min — ABNORMAL LOW (ref >60)
Glucose, Bld: 195 mg/dL — ABNORMAL HIGH (ref 70–99)
Potassium: 3.7 mmol/L (ref 3.5–5.1)
Sodium: 130 mmol/L — ABNORMAL LOW (ref 135–145)

## 2022-06-06 LAB — GLUCOSE, CAPILLARY
Glucose-Capillary: 210 mg/dL — ABNORMAL HIGH (ref 70–99)
Glucose-Capillary: 168 mg/dL — ABNORMAL HIGH (ref 70–99)
Glucose-Capillary: 154 mg/dL — ABNORMAL HIGH (ref 70–99)

## 2022-06-06 LAB — MAGNESIUM: Magnesium: 2.5 mg/dL — ABNORMAL HIGH (ref 1.7–2.4)

## 2022-06-06 SURGERY — REVISION, AMPUTATION SITE
Anesthesia: Choice | Laterality: Right

## 2022-06-06 MED ORDER — DOCUSATE SODIUM 100 MG PO CAPS
100.0000 mg | ORAL_CAPSULE | Freq: Two times a day (BID) | ORAL | Status: DC
  Administered 2022-06-06 – 2022-06-12 (×11): 100 mg via ORAL
  Filled 2022-06-06 (×13): qty 1

## 2022-06-06 MED ORDER — POLYETHYLENE GLYCOL 3350 17 G PO PACK
17.0000 g | PACK | Freq: Two times a day (BID) | ORAL | Status: DC
  Administered 2022-06-06 – 2022-06-10 (×8): 17 g via ORAL
  Filled 2022-06-06 (×9): qty 1

## 2022-06-06 MED ORDER — HEPARIN SODIUM (PORCINE) 1000 UNIT/ML IJ SOLN
INTRAMUSCULAR | Status: AC
  Filled 2022-06-06: qty 4

## 2022-06-06 MED ORDER — CHLORHEXIDINE GLUCONATE CLOTH 2 % EX PADS
6.0000 | MEDICATED_PAD | Freq: Every day | CUTANEOUS | Status: DC
  Administered 2022-06-07 – 2022-06-13 (×4): 6 via TOPICAL

## 2022-06-06 MED ORDER — GERHARDT'S BUTT CREAM
TOPICAL_CREAM | Freq: Three times a day (TID) | CUTANEOUS | Status: DC
  Administered 2022-06-08: 1 via TOPICAL
  Filled 2022-06-06: qty 1

## 2022-06-06 NOTE — Progress Notes (Signed)
Received patient in bed to unit.  Alert and orientedx4 Informed consent signed and in chart.   Treatment initiated: 1644 Treatment completed: 2052  Patient tolerated well.  Transported back to the room  Alert, without acute distress.  Hand-off given to patient's nurse.   Access used: dialysis cath Access issues: none  Total UF removed: 3500 Medication(s) given: none Post HD VS: see table below Post HD weight: 88.8kg bed scale    06/06/22 2058  Vitals  BP (!) 129/50  MAP (mmHg) 72  BP Location Right Arm  BP Method Automatic  Patient Position (if appropriate) Lying  Pulse Rate 75  Pulse Rate Source Monitor  ECG Heart Rate 75  Resp 12  Oxygen Therapy  SpO2 100 %  O2 Device Nasal Cannula  O2 Flow Rate (L/min) 3 L/min  Patient Activity (if Appropriate) In bed  Pulse Oximetry Type Continuous  During Treatment Monitoring  Intra-Hemodialysis Comments Tolerated well  Post Treatment  Dialyzer Clearance Clear  Duration of HD Treatment -hour(s) 3.5 hour(s)  Hemodialysis Intake (mL) 0 mL  Liters Processed 75.4  Fluid Removed (mL) 3500 mL  Tolerated HD Treatment Yes      [facility]

## 2022-06-06 NOTE — TOC Progression Note (Signed)
Transition of Care [facility]) - Initial/Assessment Note    Patient Details  Name: Susan [name] MRN: [id number] Date of Birth: Nov 25, 1950  [name] CM/SW Contact:    Milinda Antis, [place] Phone Number: 06/06/2022, 3:29 PM  Clinical Narrative:                 LCSW spoke with Nicki in admissions at [facility] (SNF).  The patient is short term at the facility and will need insurance auth prior to returning.          Patient Goals and CMS Choice            Expected Discharge Plan and Services                                              Prior Living Arrangements/Services                       Activities of Daily Living      Permission Sought/Granted                  Emotional Assessment              Admission diagnosis:  Diverticulitis [K57.92] Spleen hematoma, initial encounter [S36.029A] Patient Active Problem List   Diagnosis Date Noted   History of Clostridioides difficile colitis 06/06/2022   Diverticulitis 06/05/2022   Stercoral colitis 06/05/2022   C. difficile colitis 06/05/2022   Spleen hematoma 06/05/2022   Wound dehiscence 06/05/2022   Rectal ulcer 05/27/2022   ESRD on dialysis ([place]) 05/27/2022   GI bleed 05/23/2022   Difficult intravenous access 05/23/2022   Gangrene of right foot ([place]) 05/02/2022   S/P BKA (below knee amputation) unilateral, right ([place]) 05/02/2022   Unspecified protein-calorie malnutrition ([place]) 04/15/2022   Secondary hyperparathyroidism of renal origin ([place]) 04/14/2022   Coagulation defect, unspecified ([place]) 04/09/2022   Acquired absence of other left toe(s) ([place]) 04/07/2022   Allergy, unspecified, initial encounter 04/07/2022   Dependence on renal dialysis ([place]) 04/07/2022   Gout due to renal impairment, unspecified site 04/07/2022   Hypertensive heart and chronic kidney disease with heart failure and with stage 5 chronic kidney disease, or end stage renal disease ([place]) 04/07/2022    Personal history of transient ischemic attack (TIA), and cerebral infarction without residual deficits 04/07/2022   Renal osteodystrophy 04/07/2022   Venous stasis ulcer of right calf ([place]) 03/31/2022   Fistula, colovaginal 03/26/2022   Diarrhea 03/26/2022   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   Sepsis without acute organ dysfunction ([place])    Bacteremia    Acute pancreatitis 02/01/2022   Abdominal pain 02/01/2022   SIRS (systemic inflammatory response syndrome) ([place]) 02/01/2022   Transaminitis 02/01/2022   History of anemia due to chronic kidney disease 02/01/2022   Paroxysmal atrial fibrillation ([place]) 02/01/2022   DKA (diabetic ketoacidosis) ([place]) 01/14/2022   NSTEMI (non-ST elevated myocardial infarction) ([place]) 03/05/2021   Acute renal failure superimposed on stage 4 chronic kidney disease ([place]) 08/22/2020   Hypoalbuminemia 05/25/2020   GERD (gastroesophageal reflux disease) 05/25/2020   Pressure injury of skin 05/17/2020   Type 2 diabetes mellitus with diabetic polyneuropathy, with long-term current use of insulin ([place]) 03/07/2020   Obesity, Class III, BMI 40-49.9 (morbid obesity) ([place]) 03/07/2020   Common bile duct (CBD) obstruction 05/28/2019   Benign neoplasm of ascending colon  Benign neoplasm of transverse colon    Benign neoplasm of descending colon    Benign neoplasm of sigmoid colon    Gastric polyps    Hyperkalemia 03/11/2019   Prolonged QT interval 03/11/2019   Acute blood loss anemia 03/11/2019   Onychomycosis 06/21/2018   Osteomyelitis of second toe of right foot ([place])    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 (HCC)    PVD (peripheral vascular disease) ([place]) 10/26/2017   E-coli UTI 07/27/2017   Hypothyroidism 07/27/2017   AKI (acute kidney injury) ([place])    [place] (pulmonary artery hypertension) (HCC)    Impaired ambulation 07/19/2017   Nausea & vomiting 07/15/2017   Leg cramps 02/27/2017   Peripheral edema 01/12/2017   Diabetic neuropathy  ([place]) 11/12/2016   CKD (chronic kidney disease), stage IV ([place]) 10/24/2015   Anemia of chronic disease 10/03/2015   Generalized anxiety disorder 10/03/2015   Insomnia 10/03/2015   Hyperglycemia due to diabetes mellitus ([place]) 06/07/2015   Chronic diastolic CHF (congestive heart failure) ([place]) 06/07/2015   Non compliance with medical treatment 04/17/2014   Rotator cuff tear 03/14/2014   Class 3 obesity ([place]) 09/23/2013   Chronic HFrEF (heart failure with reduced ejection fraction) ([place]) 06/03/2013   Hypotension 12/25/2012   Urinary incontinence    MDD (major depressive disorder) 11/12/2010   RBBB (right bundle branch block)    Coronary artery disease    Hyperlipemia 01/22/2009   Essential hypertension 01/22/2009   PCP:  [name], MD Pharmacy:   [id number] [place] [id number]: [phone]: [phone]    Social Determinants of Health (SDOH) Social History: SDOH Screenings   Food Insecurity: No Food Insecurity (05/26/2022)  Housing: Low Risk  (05/26/2022)  Transportation Needs: No Transportation Needs (05/26/2022)  Utilities: Not At Risk (05/26/2022)  Alcohol Screen: Low Risk  (08/16/2021)  Depression (PHQ2-9): Low Risk  (04/23/2022)  Financial Resource Strain: Low Risk  (08/16/2021)  Physical Activity: Inactive (08/16/2021)  Social Connections: Moderately Isolated (08/16/2021)  Stress: No Stress Concern Present (08/16/2021)  Tobacco Use: Medium Risk (06/05/2022)   SDOH Interventions:     Readmission Risk Interventions    05/12/2022    5:04 PM 02/07/2022    2:57 PM 01/23/2022   12:38 PM  Readmission Risk Prevention Plan  Transportation Screening Complete Complete Complete  PCP or Specialist Appt within 3-5 Days   Complete  [place] or [place]  Complete  Social Work Consult for [place]seling   Complete  Palliative Care Screening   Not  Applicable  Medication Review (Press photographer  Complete Complete  PCP or Specialist appointment within 3-5 days of discharge  Complete   [place]RI or [place] Complete   SW Recovery Care/Counseling Consult Complete Complete   Palliative Care Screening  Not A[place]Complete Not Applicable

## 2022-06-06 NOTE — Consult Note (Signed)
ORTHOPAEDIC CONSULTATION  REQUESTING PHYSICIAN: Elmarie Shiley, MD  Chief Complaint: Dehiscence right below-knee amputation.  HPI: Susan [name] is a 72 y.o. female who presents with patient is a 72 year old woman who is seen for dehiscence and ulceration right transtibial amputation.  Patient has been admitted for medical conditions including rectal bleeding and C. difficile.  Patient is currently on dialysis.  Past Medical History:  Diagnosis Date   Acute MI ([place]) 1999; 2007; 03/05/21   Anemia    hx   Anginal pain (HCC)    Anxiety    ARF (acute renal failure) ([place]) 06/2017   [facility]   Arthritis    "generalized" (03/15/2014)   CAD (coronary artery disease)    MI in 2000 - MI  2007 - treated bare metal stent (no nuclear since then as 9/11)   Carotid artery disease (HCC)    CHF (congestive heart failure) ([place])    HFrEF 03/06/21   Chronic diastolic heart failure ([place])    a) ECHO (08/2013) EF 55-60% and RV function nl b) RHC (08/2013) RA 4, RV 30/5/7, PA 25/10 (16), PCWP 7, Fick CO/CI 6.3/2.7, PVR 1.5 WU, PA 61 and 66%   Daily headache    "~ every other day; since I fell in June" (03/15/2014)   Depression    Dyslipidemia    Dyspnea    uses oxygen qhs via [place]   ESRD (end stage renal disease) ([place])    Dialysis on Tues Thurs Sat   Exertional shortness of breath    History of kidney stones    HTN (hypertension)    Hypothyroidism    Neuropathy    Obesity    Osteoarthritis    PAF (paroxysmal atrial fibrillation) (HCC)    Peripheral neuropathy    bilateral feet/hands   PONV (postoperative nausea and vomiting)    RBBB (right bundle branch block)    Old   Stroke ([place])    mini strokes   Syncope    likely due to low blood sugar   Tachycardia    Sinus tachycardia   Type II diabetes mellitus (HCC)    Type II, Lamar Sprinkles libre left upper arm. patient has omnipod insulin pump with Novolin R Insulin   Urinary incontinence    Venous insufficiency    Past  Surgical History:  Procedure Laterality Date   ABDOMINAL HYSTERECTOMY  1980's   AMPUTATION Right 02/24/2018   Procedure: RIGHT FOOT GREAT TOE AND 2ND TOE AMPUTATION;  Surgeon: Newt Minion, MD;  Location: [place];  Service: Orthopedics;  Laterality: Right;   AMPUTATION Right 04/30/2018   Procedure: RIGHT TRANSMETATARSAL AMPUTATION;  Surgeon: Newt Minion, MD;  Location: [place];  Service: Orthopedics;  Laterality: Right;   AMPUTATION Right 05/02/2022   Procedure: RIGHT BELOW KNEE AMPUTATION;  Surgeon: Newt Minion, MD;  Location: [place];  Service: Orthopedics;  Laterality: Right;   AV FISTULA PLACEMENT Left 04/02/2022   Procedure: LEFT ARM ARTERIOVENOUS (AV) FISTULA CREATION;  Surgeon: Serafina Mitchell, MD;  Location: [place];  Service: Vascular;  Laterality: Left;  PERIPHERAL NERVE BLOCK   BIOPSY  05/27/2020   Procedure: BIOPSY;  Surgeon: [name], DO;  [facility];  Service: Endoscopy;;   CATARACT EXTRACTION, BILATERAL Bilateral ?2013   COLONOSCOPY W/ POLYPECTOMY     COLONOSCOPY WITH PROPOFOL N/A 03/13/2019   Procedure: COLONOSCOPY WITH PROPOFOL;  Surgeon: Jerene Bears, MD;  Location: [place];  Service: Gastroenterology;  Laterality: N/A;  CORONARY ANGIOPLASTY WITH STENT PLACEMENT  1999; 2007   "1 + 1"   ERCP N/A 02/03/2022   Procedure: ENDOSCOPIC RETROGRADE CHOLANGIOPANCREATOGRAPHY (ERCP);  Surgeon: Carol Ada, MD;  Location: [place];  Service: Gastroenterology;  Laterality: N/A;   ESOPHAGOGASTRODUODENOSCOPY (EGD) WITH PROPOFOL N/A 03/13/2019   Procedure: ESOPHAGOGASTRODUODENOSCOPY (EGD) WITH PROPOFOL;  Surgeon: Jerene Bears, MD;  Location[facility] ENDOSCOPY;  Service: Gastroenterology;  Laterality: N/A;   ESOPHAGOGASTRODUODENOSCOPY (EGD) WITH PROPOFOL N/A 05/27/2020   Procedure: ESOPHAGOGASTRODUODENOSCOPY (EGD) WITH PROPOFOL;  Surgeon: [name], DO;  [facility];  Service: Endoscopy;  Laterality: N/A;   EYE SURGERY Bilateral    lazer    FLEXIBLE SIGMOIDOSCOPY N/A 05/23/2022   Procedure: FLEXIBLE SIGMOIDOSCOPY;  Surgeon: Carol Ada, MD;  Location: [place];  Service: Gastroenterology;  Laterality: N/A;   FLEXIBLE SIGMOIDOSCOPY N/A 05/24/2022   Procedure: FLEXIBLE SIGMOIDOSCOPY;  Surgeon: Sharyn Creamer, MD;  Location: [place];  Service: Gastroenterology;  Laterality: N/A;   HEMOSTASIS CLIP PLACEMENT  03/13/2019   Procedure: HEMOSTASIS CLIP PLACEMENT;  Surgeon: Jerene Bears, MD;  Location: [facility] ENDOSCOPY;  Service: Gastroenterology;;   HEMOSTASIS CLIP PLACEMENT  05/23/2022   Procedure: HEMOSTASIS CLIP PLACEMENT;  Surgeon: Carol Ada, MD;  Location: [place];  Service: Gastroenterology;;   HEMOSTASIS CONTROL  05/24/2022   Procedure: HEMOSTASIS CONTROL;  Surgeon: Sharyn Creamer, MD;  Location: [place];  Service: Gastroenterology;;   HOT HEMOSTASIS N/A 05/23/2022   Procedure: HOT HEMOSTASIS (ARGON PLASMA COAGULATION/BICAP);  Surgeon: Carol Ada, MD;  Location: [place];  Service: Gastroenterology;  Laterality: N/A;   I & D EXTREMITY Left 05/05/2022   Procedure: IRRIGATION AND DEBRIDEMENT LEFT ARM AV FISTULA;  Surgeon: Marty Heck, MD;  Location: [place];  Service: Vascular;  Laterality: Left;   INSERTION OF DIALYSIS CATHETER Right 04/02/2022   Procedure: INSERTION OF TUNNELED DIALYSIS CATHETER;  Surgeon: Serafina [place], MD;  [facility];  Service: Vascular;  Laterality: Right;   KNEE ARTHROSCOPY Left 10/25/2006   POLYPECTOMY  03/13/2019   Procedure: POLYPECTOMY;  Surgeon: Jerene Bears, MD;  Location: [place] ENDOSCOPY;  Service: Gastroenterology;;   REMOVAL OF STONES  02/03/2022   Procedure: REMOVAL OF STONES;  Surgeon: Carol Ada, MD;  Location: [place];  Service: Gastroenterology;;   RIGHT HEART CATH N/A 07/24/2017   Procedure: RIGHT HEART CATH;  Surgeon: Jolaine Artist, MD;  Location: [place] [facility];  Service: Cardiovascular;  Laterality: N/A;   RIGHT HEART CATHETERIZATION N/A  09/22/2013   Procedure: RIGHT HEART CATH;  Surgeon: Jolaine Artist, MD;  Location: [facility] CATH LAB;  Service: Cardiovascular;  Laterality: N/A;   SHOULDER ARTHROSCOPY WITH OPEN ROTATOR CUFF REPAIR Right 03/14/2014   Procedure: RIGHT SHOULDER ARTHROSCOPY WITH BICEPS RELEASE, OPEN SUBSCAPULA REPAIR, OPEN SUPRASPINATUS REPAIR.;  Surgeon: Meredith Pel, MD;  Location: [place];  Service: Orthopedics;  Laterality: Right;   SPHINCTEROTOMY  02/03/2022   Procedure: SPHINCTEROTOMY;  Surgeon: Carol Ada, MD;  Location: [place];  Service: Gastroenterology;;   TEE WITHOUT CARDIOVERSION N/A 02/04/2022   Procedure: TRANSESOPHAGEAL ECHOCARDIOGRAM (TEE);  Surgeon: Jolaine Artist, MD;  Location: [facility] ENDOSCOPY;  Service: Cardiovascular;  Laterality: N/A;   TOE AMPUTATION Right 02/24/2018   GREAT TOE AND 2ND TOE AMPUTATION   TUBAL LIGATION  1970's   Social History   Socioeconomic History   Marital status: Married    Spouse name: Percell Miller   Number of children: 3   Years of education: 12th   Highest education level: Not  on file  Occupational History    Employer: UNEMPLOYED  Tobacco Use   Smoking status: Former    Packs/day: 3.00    Years: 32.00    Total pack years: 96.00    Types: Cigarettes    Quit date: 10/24/1997    Years since quitting: 24.6   Smokeless tobacco: Never  Vaping Use   Vaping Use: Never used  Substance and Sexual Activity   Alcohol use: Not Currently    Comment: "might have 2-3 daiquiris in the summer"   Drug use: No   Sexual activity: Not Currently    Birth control/protection: Surgical    Comment: Hysterectomy  Other Topics Concern   Not on file  Social History Narrative   Pt lives at home with her spouse.Caffeine Use- 3 sodas daily.   Social Determinants of Health   Financial Resource Strain: Low Risk  (08/16/2021)   Overall Financial Resource Strain (CARDIA)    Difficulty of Paying Living Expenses: Not hard at all  Food Insecurity: No Food Insecurity (05/26/2022)    Hunger Vital Sign    Worried About Running Out of Food in the Last Year: Never true    Ran Out of Food in the Last Year: Never true  Transportation Needs: No Transportation Needs (05/26/2022)   PRAPARE - Hydrologist (Medical): No    Lack of Transportation (Non-Medical): No  Physical Activity: Inactive (08/16/2021)   Exercise Vital Sign    Days of Exercise per Week: 0 days    Minutes of Exercise per Session: 0 min  Stress: No Stress Concern Present (08/16/2021)   [place]    Feeling of Stress : Not at all  Social Connections: Moderately Isolated (08/16/2021)   Social Connection and Isolation Panel [NHANES]    Frequency of Communication with Friends and Family: More than three times a week    Frequency of Social Gatherings with Friends and Family: More than three times a week    Attends Religious Services: Never    Marine scientist or Organizations: No    Attends Music therapist: Never    Marital Status: Married   Family History  Problem Relation Age of Onset   Heart attack Mother 14   - negative except otherwise stated in the family history section Allergies  Allergen Reactions   Cephalexin Diarrhea and Other (See Comments)   Codeine Nausea And Vomiting and Other (See Comments)   Prior to Admission medications   Medication Sig Start Date End Date Taking? Authorizing Provider  albuterol (VENTOLIN HFA) 108 (90 Base) MCG/ACT inhaler Inhale 1-2 puffs into the lungs every 6 (six) hours as needed for wheezing or shortness of breath.   Yes [provider]  amiodarone (PACERONE) 200 MG tablet Take 1 tablet (200 mg total) by mouth daily. 01/29/22  Yes [place], Maricela Bo, FNP  aspirin EC 81 MG tablet Take 1 tablet (81 mg total) by mouth daily with breakfast. 06/04/22  Yes Pahwani, Einar Grad, MD  B Complex-C-Folic Acid (DIALYVITE TABLET) TABS Take 1 tablet by mouth in the  morning. (1000) 04/22/22  Yes [provider]  diphenoxylate-atropine (LOMOTIL) 2.5-0.025 MG tablet Take 1 tablet by mouth 4 (four) times daily as needed for diarrhea or loose stools. 08/19/21  Yes [name], MD  ferrous sulfate 325 (65 FE) MG tablet Take 325 mg by mouth 2 (two) times daily. (1000 & 2100)   Yes [provider]  folic acid-vitamin b complex-vitamin c-selenium-zinc (DIALYVITE) 3 MG TABS tablet Take 1 tablet by mouth daily.   Yes [provider]  hydrALAZINE (APRESOLINE) 25 MG tablet Take 1 tablet (25 mg total) by mouth every 6 (six) hours as needed (SBP>150 or DBP>100). 04/06/22  Yes Nita Sells, MD  hydrocortisone (ANUSOL-HC) 2.5 % rectal cream Place 1 Application rectally 2 (two) times daily. (1000 & 2100)   Yes [provider]  insulin aspart (NOVOLOG FLEXPEN) 100 UNIT/ML FlexPen Inject 0-15 Units into the skin See admin instructions. Inject 5 units subcutaneously 3 times daily with meals (0900, 1300 & 1700) & as needed via sliding scale insulin CBG <70=Notify NP/MD 71-149=0 units,150-199=4 units, 200-249=6 units,250- 299=10 units, 300-349=12 units, 350-399=15 units, > 400 call MD ( prime pen with 2 units prior  to set dose)   Yes [provider]  insulin glargine (LANTUS SOLOSTAR) 100 UNIT/ML Solostar Pen Inject 10 Units into the skin at bedtime.   Yes [provider]  levothyroxine (SYNTHROID, LEVOTHROID) 50 MCG tablet Take 1 tablet (50 mcg total) by mouth daily before breakfast. 11/07/16  Yes Dena Billet B, PA-C  linagliptin (TRADJENTA) 5 MG TABS tablet Take 1 tablet (5 mg total) by mouth daily. 04/07/22  Yes Nita Sells, MD  magnesium oxide (MAG-OX) 400 (240 Mg) MG tablet Take 1 tablet (400 mg total) by mouth daily. 07/18/21  Yes [name], MD  midodrine (PROAMATINE) 10 MG tablet Take 1 tablet (10 mg total) by mouth every Monday, Wednesday, and Friday with hemodialysis. 05/30/22 06/29/22 Yes Pahwani,  Einar Grad, MD  midodrine (PROAMATINE) 5 MG tablet Take 1 tablet (5 mg total) by mouth 3 (three) times daily with meals. 05/28/22 06/27/22 Yes Pahwani, Einar Grad, MD  Nutritional Supplements (FEEDING SUPPLEMENT, GLUCERNA 1.2 CAL,) LIQD Take 237 mLs by mouth daily.   Yes [provider]  ondansetron (ZOFRAN) 4 MG tablet Take 4 mg by mouth every 8 (eight) hours as needed for nausea.   Yes [provider]  oxyCODONE-acetaminophen (PERCOCET/ROXICET) 5-325 MG tablet Take 1 tablet by mouth every 4 (four) hours as needed. 05/28/22  Yes Pahwani, Einar Grad, MD  OXYGEN Inhale 2 L/min into the lungs at bedtime.   Yes [provider]  pantoprazole (PROTONIX) 40 MG tablet Take 1 tablet (40 mg total) by mouth daily. 05/28/22 05/28/23 Yes Pahwani, Einar Grad, MD  polyethylene glycol (MIRALAX / GLYCOLAX) 17 g packet Take 17 g by mouth in the morning. (1000)   Yes [provider]  pregabalin (LYRICA) 75 MG capsule Take 1 capsule (75 mg total) by mouth daily. 04/07/22  Yes Nita Sells, MD  saccharomyces boulardii (FLORASTOR) 250 MG capsule Take 500 mg by mouth 2 (two) times daily. X14 days   Yes [provider]  senna-docusate (SENOKOT-S) 8.6-50 MG tablet Take 2 tablets by mouth at bedtime. (2200)   Yes [provider]  sevelamer carbonate (RENVELA) 800 MG tablet Take 1,600 mg by mouth 3 (three) times daily with meals. (0800, 1200 & 1700)   Yes [provider]  vortioxetine HBr (TRINTELLIX) 20 MG TABS tablet Take 20 mg by mouth in the morning. (1000)   Yes [provider]  blood glucose meter kit and supplies Use up to four times daily as directed. 01/23/22   Ghimire, Henreitta Leber, MD  Continuous Blood Gluc Sensor (FREESTYLE LIBRE 2 SENSOR) MISC  01/14/22   [provider]  Fingerstix Lancets MISC Use as directed to check blood sugars as need up to 4 times daily 01/23/22  Jonetta Osgood, MD  Insulin Pen Needle 32G X 4 MM MISC Use as directed 01/23/22   Ghimire,  Henreitta Leber, MD  vortioxetine HBr (TRINTELLIX) 10 MG TABS tablet Take 2 tablets (20 mg total) by mouth daily. Patient not taking: Reported on 06/05/2022 04/23/22   [name], MD   CT ABDOMEN PELVIS WO CONTRAST  Result Date: 06/05/2022 CLINICAL DATA:  Abdominal pain. EXAM: CT ABDOMEN AND PELVIS WITHOUT CONTRAST TECHNIQUE: Multidetector CT imaging of the abdomen and pelvis was performed following the standard protocol without IV contrast. RADIATION DOSE REDUCTION: This exam was performed according to the departmental dose-optimization program which includes automated exposure control, adjustment of the mA and/or kV according to patient size and/or use of iterative reconstruction technique. COMPARISON:  CTA abdomen and pelvis 05/23/2022, CT abdomen and pelvis with oral contrast only 03/27/2022 FINDINGS: Lower chest: There is interval new demonstration of a small layering left pleural effusion. There is overlying atelectasis. There is mild posterior atelectasis in the right lower lobe with a 2 cm pneumatocele again seen. Lung bases are otherwise unremarkable. The cardiac size is normal. There are calcifications in the coronary arteries. Hepatobiliary: There are stones in the gallbladder without wall thickening or bile duct dilatation. There are calcified granulomas in the liver. Capsular nodularity in the left lobe consistent with cirrhosis. No focal abnormality is seen without contrast. Pancreas: Atrophic and otherwise unremarkable without contrast. Spleen: There are multiple calcified granulomas. The spleen is slightly enlarged. There is a new subcapsular complex hypodense collection in the superior aspect of the spleen measuring 6 x 4.2 x 2 cm. Major differential concerns include a contained subcapsular hematoma and an infectious process. Hounsfield density is difficult to accurately characterize due to streak artifact from the patient's overlying arms, but measures between - 10 and -28 Hounsfield units if  accurate. If this is a hematoma there is no perisplenic hemorrhagic products. If this is an abscess, there is no air in it. Adrenals/Urinary Tract: Slight chronic nodular thickening of the adrenal glands. No new adrenal abnormality. Extensive renovascular calcifications at both renal hila are again shown with bilateral renal cortical thinning. Small renal cysts are similar. No new renal abnormality. No stones or urinary obstruction. Unremarkable bladder. Stomach/Bowel: The stomach contracted and not well seen but no obvious wall thickening suspected. Small-bowel there are increased thickened folds in the left abdominal small bowel but there is no acute inflammatory change or obstruction. The appendix is normal caliber. There is sigmoid diverticulosis with increased wall thickening and inflammatory stranding involving the mid to distal thirds of the sigmoid colon consistent with acute diverticulitis. There is a large stool ball in the rectum not seen previously and scattered rectal wall pneumatosis. Some of the rectal stool is in the process of being expelled. Vascular/Lymphatic: There are extensive visceral arterial calcifications and moderate aortoiliac calcific plaques. This was described in detail in the recent CTA abdomen and pelvis report. There are mild mesenteric congestive features which have increased in the interval. No abdominal, pelvic or inguinal adenopathy is seen. Reproductive: Status post hysterectomy. No adnexal masses. Other: In addition to increased mesenteric congestion there is increased body wall anasarca. Minimal posterior pelvic ascites is unchanged. There is no free air, free hemorrhage or abscess. No incarcerated hernia. Musculoskeletal: There is osteopenia and degenerative change of the spine. No aggressive bone lesion is seen. IMPRESSION: 1. New 6 x 4.2 x 2 cm complex subcapsular collection in the superior aspect of the spleen. Major differential concerns include a contained  subcapsular  hematoma and an infectious process. If this is a hematoma there are no perisplenic hemorrhagic products. If this is an abscess, there is no air in it. 2. New small left pleural effusion with overlying atelectasis. 3. Increased mesenteric congestion and body wall anasarca. 4. Increased thickened folds in the left abdominal small bowel without acute inflammatory change. This could be due to mesenteric congestion or enteritis. 5. Large stool ball in the rectum with scattered rectal wall pneumatosis, the latter which could indicate ischemic changes or be due to stercoral proctitis. 6. Sigmoid diverticulosis with increased wall thickening and inflammatory stranding in the mid to distal sigmoid consistent with acute diverticulitis. No free air or free hemorrhage. 7. Cholelithiasis. 8. Aortic and coronary artery atherosclerosis. 9. Capsular nodularity in the left lobe of liver consistent with cirrhosis. Aortic Atherosclerosis (ICD10-I70.0). Electronically Signed   By: Telford Nab M.D.   On: 06/05/2022 04:21   - pertinent xrays, CT, MRI studies were reviewed and independently interpreted  Positive ROS: All other systems have been reviewed and were otherwise negative with the exception of those mentioned in the HPI and as above.  Physical Exam: General: Alert, no acute distress Psychiatric: Patient is competent for consent with normal mood and affect Lymphatic: No axillary or cervical lymphadenopathy Cardiovascular: No pedal edema Respiratory: No cyanosis, no use of accessory musculature GI: No organomegaly, abdomen is soft and non-tender    Images:  '@ENCIMAGES'$ @  Labs:  Lab Results  Component Value Date   HGBA1C 8.9 (H) 05/02/2022   HGBA1C >15.5 (H) 01/20/2022   HGBA1C >15.5 (H) 01/19/2022   CRP 0.7 08/22/2020   CRP 1.5 (H) 03/13/2020   CRP 1.9 (H) 03/12/2020   LABURIC 4.7 08/09/2020   LABURIC 9.1 (H) 10/12/2018   LABURIC 9.0 (H) 03/06/2018   REPTSTATUS PENDING 06/05/2022   GRAMSTAIN   05/05/2022    FEW WBC PRESENT, PREDOMINANTLY PMN NO ORGANISMS SEEN    CULT  06/05/2022    NO GROWTH 1 DAY Performed at [facility], [place] [address] [place], [place] [address]    LABORGA SERRATIA MARCESCENS 05/05/2022    Lab Results  Component Value Date   ALBUMIN 1.6 (L) 06/06/2022   ALBUMIN 2.0 (L) 06/05/2022   ALBUMIN 2.1 (L) 05/22/2022   PREALBUMIN 13 (L) 05/02/2022   LABURIC 4.7 08/09/2020   LABURIC 9.1 (H) 10/12/2018   LABURIC 9.0 (H) 03/06/2018        Latest Ref Rng & Units 06/06/2022    5:33 AM 06/05/2022    2:53 AM 05/29/2022    6:30 AM  CBC EXTENDED  WBC 4.0 - 10.5 K/uL 10.7  10.4  12.4   RBC 3.87 - 5.11 MIL/uL 3.03  3.30  3.08   Hemoglobin 12.0 - 15.0 g/dL 8.3  8.9  8.5   HCT 36.0 - 46.0 % 26.5  29.1  27.4   Platelets 150 - 400 K/uL 187  246  199   NEUT# 1.7 - 7.7 K/uL  8.6    Lymph# 0.7 - 4.0 K/uL  1.0      Neurologic: Patient does not have protective sensation bilateral lower extremities.   MUSCULOSKELETAL:   Skin: Examination patient has necrotic wound over the right residual limb.  There is no ascending cellulitis.  No purulent drainage.  White cell count 10.7 hemoglobin 8.3 albumin 1.6 with a hemoglobin A1c that has been chronically above 15.  Assessment: Assessment dehiscence right below the knee amputation.  Plan: Will tentatively posted for surgery for  next Friday.  Patient will need to resolve her current medical conditions prior to proceeding with surgery.  Thank you for the consult and the opportunity to see Ms. Fabian November, MD [facility] [phone] 4:29 PM

## 2022-06-06 NOTE — Consult Note (Addendum)
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 She continues to have contipation and is getting miralax currently.  I am unclear how/what kind of stool sample was sent for cdiff testing but the testing is negative for toxin (antigen/pcr positive).   Clinically this is not c/w cdiff  During 01/2022 she has diarrheal illness and positive toxin/pcr testing   I discussed with surgery team. Her blood cx negative, she has no sign of peritonitis. There is some pneumatosis around the stool ball; she also has recent rectal ulcer that needs oversewing on 12/30 as well. In this setting, especially high risk for actually developing cdiff, I would avoid systemic abx for the stercoral colitis   The splenic imaging suggestion of hematoma (recent doac) will likely need to be followed up. In the ddx is infection but agree with surgery that without sepsis and appearance wise doesn't appear clinically to be hematoma   ------- She has mild leukocytosis that could be attributed to splenic bleed, abd pain with stress demargination  Her recent bka stump has some dehiscence. Dr Sharol Given plans to revise this soon. It  doesn't appear to be infected at this time  Again given nontoxic/lack of sepsis I suspect her abd pain is rather due to severe constipation. There is imaging suggestion of also sigmoid diverticulitis but would just monitor off abx for now due to reason above   Plan: Stop PO vanc and iv piptazo Monitor clinically If sign of sepsis, please reengage ID She probably need f/u imaging outpatient for monitoring of the splenic hematoma Id will sign off Discussed with primary team    I spent 75 minute reviewing data/chart, and coordinating care and >50% direct face to face time providing counseling/discussing diagnostics/treatment plan with patient       ------------------------------------------------ Principal Problem:   Diverticulitis Active Problems:   Anemia of chronic disease   Hypothyroidism   Prolonged QT interval   Type 2 diabetes mellitus with diabetic polyneuropathy, with long-term current use of insulin (HCC)   Paroxysmal atrial fibrillation (HCC)   ESRD on dialysis ([place])   Stercoral colitis   C. difficile colitis   Perisplenic hematoma   Wound dehiscence, surgical    HPI: Susan [name] is a 72 y.o. 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 But otherwise felt well close to baseline  She has been getting laxative at nursing home due to constipation.   Due to progression of pain she came for  admission  Afebrile Mild leukocytosis 11 Ct abd pelv with stercoral colitis finding, splenic hematoma Cdiff pcr/antigen positive Started on po vanc and empiric iv abx Bcx ngtd  Surgery evaluated and help manage the stercoral colitis/consiptation. They also comment on splenic hematoma and nothing to do at this point.  She last took doac on 12/18 for afib She has rectal ulcer bleeding 12/30 underwent OR procedure to oversew the ulcer  Her last cdiff was 01/2022 with toxin testing was positive and had clinical syndrome of diarrheal illness/abd pain  She also has recent right bka and the stump is dehiscing. She is pendign revision of it by Dr Sharol Given. There is stable mild-moderate pain there without purulent discharge   No f/c/n/v/weight loss/nightsweat   Family History  Problem Relation Age of Onset   Heart attack Mother 47    Social History   Tobacco Use   Smoking status: Former    Packs/day: 3.00    Years: 32.00    Total pack years: 96.00    Types: Cigarettes    Quit date: 10/24/1997    Years since quitting: 24.6   Smokeless tobacco: Never  Vaping Use   Vaping Use: Never used  Substance Use Topics   Alcohol use: Not Currently    Comment: "might have 2-3 daiquiris in the summer"   Drug use: No    Allergies  Allergen Reactions   Cephalexin Diarrhea and Other (See Comments)   Codeine Nausea And Vomiting and Other (See Comments)    Review of Systems: ROS All Other ROS was negative, except mentioned above   Past Medical History:  Diagnosis Date   Acute MI ([place]) 1999; 2007; 03/05/21   Anemia    hx   Anginal pain ([place])    Anxiety    ARF (acute renal failure) ([place]) 06/2017   [facility] Asso   Arthritis    "generalized" (03/15/2014)   CAD (coronary artery disease)    MI in 2000 - MI  2007 - treated bare metal stent (no nuclear since then as 9/11)   Carotid artery disease (HCC)    CHF (congestive heart failure) ([place])    HFrEF 03/06/21   Chronic diastolic  heart failure ([place])    a) ECHO (08/2013) EF 55-60% and RV function nl b) RHC (08/2013) RA 4, RV 30/5/7, PA 25/10 (16), PCWP 7, Fick CO/CI 6.3/2.7, PVR 1.5 WU, PA 61 and 66%   Daily headache    "~ every other day; since I fell in June" (03/15/2014)   Depression    Dyslipidemia    Dyspnea    uses oxygen qhs via [place]   ESRD (end stage renal disease) ([place])    Dialysis on Tues Thurs Sat   Exertional shortness of breath    History of kidney stones    HTN (hypertension)    Hypothyroidism    Neuropathy    Obesity    Osteoarthritis    PAF (paroxysmal atrial fibrillation) (HCC)    Peripheral neuropathy    bilateral feet/hands   PONV (postoperative nausea and vomiting)    RBBB (right bundle branch block)    Old   Stroke ([place])  mini strokes   Syncope    likely due to low blood sugar   Tachycardia    Sinus tachycardia   Type II diabetes mellitus (HCC)    Type II, Lamar Sprinkles libre left upper arm. patient has omnipod insulin pump with Novolin R Insulin   Urinary incontinence    Venous insufficiency        Scheduled Meds:  amiodarone  200 mg Oral Daily   docusate sodium  100 mg Oral BID   Gerhardt's butt cream   Topical TID   insulin aspart  0-15 Units Subcutaneous TID WC   insulin aspart  0-5 Units Subcutaneous QHS   insulin glargine-yfgn  20 Units Subcutaneous QHS   levothyroxine  50 mcg Oral QAC breakfast   midodrine  10 mg Oral Q M,W,F-HD   midodrine  5 mg Oral TID WC   multivitamin  1 tablet Oral Daily   pantoprazole  40 mg Oral Daily   polyethylene glycol  17 g Oral BID   pregabalin  75 mg Oral Daily   saccharomyces boulardii  500 mg Oral BID   sevelamer carbonate  1,600 mg Oral TID WC   sodium chloride flush  3 mL Intravenous Q12H   vortioxetine HBr  20 mg Oral q AM   Continuous Infusions: PRN Meds:.acetaminophen **OR** acetaminophen, albuterol, fentaNYL (SUBLIMAZE) injection, hydrALAZINE, oxyCODONE-acetaminophen, trimethobenzamide   OBJECTIVE: Blood pressure (!)  108/45, pulse 82, temperature 98.1 F (36.7 C), temperature source Oral, resp. rate 19, SpO2 100 %.  Physical Exam  General/constitutional: no distress, pleasant, nontoxic HEENT: Normocephalic, PER, Conj Clear, EOMI, Oropharynx clear Neck supple CV: rrr no mrg Lungs: clear to auscultation, normal respiratory effort Abd: Soft, Nontender Ext: LLE chronic edema  Skin/msk: hyperkeratosis LLE; scattered small nodules/hematoma LLE with tiny ulcerations; right bka stump dehiscence with staples still intact; no purulence; mild erythema; mild-moderate tenderness; no rash otherwise Neuro: nonfocal; alert/oriented      Lab Results Lab Results  Component Value Date   WBC 10.7 (H) 06/06/2022   HGB 8.3 (L) 06/06/2022   HCT 26.5 (L) 06/06/2022   MCV 87.5 06/06/2022   PLT 187 06/06/2022    Lab Results  Component Value Date   CREATININE 2.99 (H) 06/06/2022   CREATININE 3.00 (H) 06/06/2022   BUN 38 (H) 06/06/2022   BUN 37 (H) 06/06/2022   NA 130 (L) 06/06/2022   NA 131 (L) 06/06/2022   K 3.7 06/06/2022   K 3.8 06/06/2022   CL 93 (L) 06/06/2022   CL 93 (L) 06/06/2022   CO2 26 06/06/2022   CO2 26 06/06/2022    Lab Results  Component Value Date   ALT 11 06/05/2022   AST 11 (L) 06/05/2022   GGT 144 (H) 02/01/2022   ALKPHOS 101 06/05/2022   BILITOT 0.6 06/05/2022      Microbiology: Recent Results (from the past 240 hour(s))  C Difficile Quick Screen w PCR reflex     Status: Abnormal   Collection Time: 06/05/22  9:03 AM   Specimen: STOOL  Result Value Ref Range Status   C Diff antigen POSITIVE (A) NEGATIVE Final   C Diff toxin NEGATIVE NEGATIVE Final   C Diff interpretation Results are indeterminate. See PCR results.  Final    Comment: Performed at [facility], [place] [address] [place], [place] [address]  C. Diff by PCR, Reflexed     Status: Abnormal   Collection Time: 06/05/22  9:03 AM  Result Value Ref Range Status   Toxigenic  C. Difficile by PCR POSITIVE (A)  NEGATIVE Final    Comment: Positive for toxigenic C. difficile with little to no toxin production. Only treat if clinical presentation suggests symptomatic illness. Performed at [facility], [place] [address] [place], [place] [address]      Serology:    Imaging: If present, new imagings (plain films, ct scans, and mri) have been personally visualized and interpreted; radiology reports have been reviewed. Decision making incorporated into the Impression / Recommendations.  06/05/22 ct abd pelv 1. New 6 x 4.2 x 2 cm complex subcapsular collection in the superior aspect of the spleen. Major differential concerns include a contained subcapsular hematoma and an infectious process. If this is a hematoma there are no perisplenic hemorrhagic products. If this is an abscess, there is no air in it. 2. New small left pleural effusion with overlying atelectasis. 3. Increased mesenteric congestion and body wall anasarca. 4. Increased thickened folds in the left abdominal small bowel without acute inflammatory change. This could be due to mesenteric congestion or enteritis. 5. Large stool ball in the rectum with scattered rectal wall pneumatosis, the latter which could indicate ischemic changes or be due to stercoral proctitis. 6. Sigmoid diverticulosis with increased wall thickening and inflammatory stranding in the mid to distal sigmoid consistent with acute diverticulitis. No free air or free hemorrhage. 7. Cholelithiasis. 8. Aortic and coronary artery atherosclerosis. 9. Capsular nodularity in the left lobe of liver consistent with cirrhosis.  Jabier Mutton, [facility] [place] [phone] pager    06/06/2022, 11:04 AM

## 2022-06-06 NOTE — Progress Notes (Signed)
Pt receives out-pt HD at [facility] on MWF. Will assist as needed.   Amore Grater Renal Navigator [phone] 

## 2022-06-06 NOTE — Progress Notes (Addendum)
[facility] Progress Note     Subjective: CC:  Reports abdominal pain as improved. Denies nausea or vomiting. Liquid stool output significant decreased since yesterday afternoon.  Objective: Vital signs in last 24 hours: Temp:  [97.9 F (36.6 C)-98.5 F (36.9 C)] 98.1 F (36.7 C) (01/12 0800) Pulse Rate:  [69-92] 82 (01/12 0800) Resp:  [12-19] 19 (01/12 0800) BP: (94-150)/(43-63) 108/45 (01/12 0800) SpO2:  [98 %-100 %] 100 % (01/12 0800) Last BM Date : 06/05/22  Intake/Output from previous day: 01/11 0701 - 01/12 0700 In: 200.5 [I.V.:3; IV Piggyback:197.5] Out: 0  Intake/Output this shift: No intake/output data recorded.  PE: Gen:  Alert, NAD, pleasant Card:  Regular rate and rhythm, LLE edema present  Pulm:  Normal effort on nasal cannula  Abd: Soft, mild RLQ tenderness - interval improvement compared to yesterday, no rebound tenderness. GU: perianal skin with some redness/irritation without any wounds, rectal tube in place - removed by me without bleeding. DRE performed - I was unable to feel any stool in the rectal vault. There was no bleeding, small amount of rectal mucous expelled during my exam but no significant BM. Psych: A&Ox3   Lab Results:  Recent Labs    06/05/22 0253 06/06/22 0533  WBC 10.4 10.7*  HGB 8.9* 8.3*  HCT 29.1* 26.5*  PLT 246 187   BMET Recent Labs    06/05/22 0253 06/06/22 0533  NA 130* 130*  131*  K 3.5 3.7  3.8  CL 93* 93*  93*  CO2 '28 26  26  '$ GLUCOSE 346* 195*  193*  BUN 28* 38*  37*  CREATININE 2.43* 2.99*  3.00*  CALCIUM 8.4* 8.1*  8.1*   PT/INR No results for input(s): "LABPROT", "INR" in the last 72 hours. CMP     Component Value Date/Time   NA 130 (L) 06/06/2022 0533   NA 131 (L) 06/06/2022 0533   K 3.7 06/06/2022 0533   K 3.8 06/06/2022 0533   CL 93 (L) 06/06/2022 0533   CL 93 (L) 06/06/2022 0533   CO2 26 06/06/2022 0533   CO2 26 06/06/2022 0533   GLUCOSE 195 (H) 06/06/2022 0533   GLUCOSE  193 (H) 06/06/2022 0533   GLUCOSE >444 (H) 06/07/2015 1311   BUN 38 (H) 06/06/2022 0533   BUN 37 (H) 06/06/2022 0533   CREATININE 2.99 (H) 06/06/2022 0533   CREATININE 3.00 (H) 06/06/2022 0533   CREATININE 6.22 (H) 03/26/2022 1620   CALCIUM 8.1 (L) 06/06/2022 0533   CALCIUM 8.1 (L) 06/06/2022 0533   PROT 5.8 (L) 06/05/2022 0253   ALBUMIN 1.6 (L) 06/06/2022 0533   AST 11 (L) 06/05/2022 0253   ALT 11 06/05/2022 0253   ALKPHOS 101 06/05/2022 0253   BILITOT 0.6 06/05/2022 0253   GFRNONAA 16 (L) 06/06/2022 0533   GFRNONAA 16 (L) 06/06/2022 0533   GFRNONAA 11 (L) 11/15/2020 1452   GFRAA 13 (L) 11/15/2020 1452   Lipase     Component Value Date/Time   LIPASE 28 06/05/2022 0253       Studies/Results: CT ABDOMEN PELVIS WO CONTRAST  Result Date: 06/05/2022 CLINICAL DATA:  Abdominal pain. EXAM: CT ABDOMEN AND PELVIS WITHOUT CONTRAST TECHNIQUE: Multidetector CT imaging of the abdomen and pelvis was performed following the standard protocol without IV contrast. RADIATION DOSE REDUCTION: This exam was performed according to the departmental dose-optimization program which includes automated exposure control, adjustment of the mA and/or kV according to patient size and/or use of iterative reconstruction technique. COMPARISON:  CTA abdomen and pelvis 05/23/2022, CT abdomen and pelvis with oral contrast only 03/27/2022 FINDINGS: Lower chest: There is interval new demonstration of a small layering left pleural effusion. There is overlying atelectasis. There is mild posterior atelectasis in the right lower lobe with a 2 cm pneumatocele again seen. Lung bases are otherwise unremarkable. The cardiac size is normal. There are calcifications in the coronary arteries. Hepatobiliary: There are stones in the gallbladder without wall thickening or bile duct dilatation. There are calcified granulomas in the liver. Capsular nodularity in the left lobe consistent with cirrhosis. No focal abnormality is seen without  contrast. Pancreas: Atrophic and otherwise unremarkable without contrast. Spleen: There are multiple calcified granulomas. The spleen is slightly enlarged. There is a new subcapsular complex hypodense collection in the superior aspect of the spleen measuring 6 x 4.2 x 2 cm. Major differential concerns include a contained subcapsular hematoma and an infectious process. Hounsfield density is difficult to accurately characterize due to streak artifact from the patient's overlying arms, but measures between - 10 and -28 Hounsfield units if accurate. If this is a hematoma there is no perisplenic hemorrhagic products. If this is an abscess, there is no air in it. Adrenals/Urinary Tract: Slight chronic nodular thickening of the adrenal glands. No new adrenal abnormality. Extensive renovascular calcifications at both renal hila are again shown with bilateral renal cortical thinning. Small renal cysts are similar. No new renal abnormality. No stones or urinary obstruction. Unremarkable bladder. Stomach/Bowel: The stomach contracted and not well seen but no obvious wall thickening suspected. Small-bowel there are increased thickened folds in the left abdominal small bowel but there is no acute inflammatory change or obstruction. The appendix is normal caliber. There is sigmoid diverticulosis with increased wall thickening and inflammatory stranding involving the mid to distal thirds of the sigmoid colon consistent with acute diverticulitis. There is a large stool ball in the rectum not seen previously and scattered rectal wall pneumatosis. Some of the rectal stool is in the process of being expelled. Vascular/Lymphatic: There are extensive visceral arterial calcifications and moderate aortoiliac calcific plaques. This was described in detail in the recent CTA abdomen and pelvis report. There are mild mesenteric congestive features which have increased in the interval. No abdominal, pelvic or inguinal adenopathy is seen.  Reproductive: Status post hysterectomy. No adnexal masses. Other: In addition to increased mesenteric congestion there is increased body wall anasarca. Minimal posterior pelvic ascites is unchanged. There is no free air, free hemorrhage or abscess. No incarcerated hernia. Musculoskeletal: There is osteopenia and degenerative change of the spine. No aggressive bone lesion is seen. IMPRESSION: 1. New 6 x 4.2 x 2 cm complex subcapsular collection in the superior aspect of the spleen. Major differential concerns include a contained subcapsular hematoma and an infectious process. If this is a hematoma there are no perisplenic hemorrhagic products. If this is an abscess, there is no air in it. 2. New small left pleural effusion with overlying atelectasis. 3. Increased mesenteric congestion and body wall anasarca. 4. Increased thickened folds in the left abdominal small bowel without acute inflammatory change. This could be due to mesenteric congestion or enteritis. 5. Large stool ball in the rectum with scattered rectal wall pneumatosis, the latter which could indicate ischemic changes or be due to stercoral proctitis. 6. Sigmoid diverticulosis with increased wall thickening and inflammatory stranding in the mid to distal sigmoid consistent with acute diverticulitis. No free air or free hemorrhage. 7. Cholelithiasis. 8. Aortic and coronary artery atherosclerosis. 9. Capsular nodularity  in the left lobe of liver consistent with cirrhosis. Aortic Atherosclerosis (ICD10-I70.0). Electronically Signed   By: Telford Nab M.D.   On: 06/05/2022 04:21    Anti-infectives: Anti-infectives (From admission, onward)    Start     Dose/Rate Route Frequency Ordered Stop   07/12/22 1000  vancomycin (VANCOCIN) capsule 125 mg       See Hyperspace for full Linked Orders Report.   125 mg Oral Every 3 DAYS 06/05/22 2301 07/21/22 0959   07/04/22 1000  vancomycin (VANCOCIN) capsule 125 mg       See Hyperspace for full Linked Orders  Report.   125 mg Oral Every other day 06/05/22 2301 07/12/22 0959   06/27/22 1000  vancomycin (VANCOCIN) capsule 125 mg       See Hyperspace for full Linked Orders Report.   125 mg Oral Daily 06/05/22 2301 07/04/22 0959   06/19/22 2200  vancomycin (VANCOCIN) capsule 125 mg       See Hyperspace for full Linked Orders Report.   125 mg Oral 2 times daily 06/05/22 2301 06/26/22 2159   06/06/22 0000  vancomycin (VANCOCIN) capsule 125 mg       See Hyperspace for full Linked Orders Report.   125 mg Oral 4 times daily 06/05/22 2301 06/19/22 2159   06/05/22 1000  piperacillin-tazobactam (ZOSYN) IVPB 2.25 g        2.25 g 100 mL/hr over 30 Minutes Intravenous Every 8 hours 06/05/22 0931     06/05/22 0930  metroNIDAZOLE (FLAGYL) IVPB 500 mg  Status:  Discontinued        500 mg 100 mL/hr over 60 Minutes Intravenous Every 12 hours 06/05/22 0925 06/05/22 0929   06/05/22 0915  metroNIDAZOLE (FLAGYL) IVPB 500 mg  Status:  Discontinued        500 mg 100 mL/hr over 60 Minutes Intravenous  Once 06/05/22 0900 06/05/22 1043   06/05/22 0915  piperacillin-tazobactam (ZOSYN) IVPB 3.375 g  Status:  Discontinued        3.375 g 12.5 mL/hr over 240 Minutes Intravenous Every 8 hours 06/05/22 0901 06/05/22 0926        Assessment/Plan Stercoral proctitis without evidence of perforation  - afebrile, WBC stable at 10  - abd exam improving without peritonitis, no emergent surgical needs, continue IV abx  - DRE negative for retained stool. I suspect she passed it yesterday because, based on her CT scan, I should have been able to feel and extract any residual solid stool.  - start stool softeners and miralax BID to soften any firm stool that may be left in her colon and help it pass. - NOTHING PER RECTUM to avoid further irritation and recurrent rectal bleeding.   Perisplenic collection - unclear etiology; possible spontaneous subcapsular hematoma, despite over two weeks off of DOAC. Lower suspicion for infectious  collection as there is no gas in the collection. Monitor H&H and abd exam.     ?c.dif colitis - I think test is positive because of her previous episode, no evidence of acute c.dif. Per primary team and ID; exam benign no emergent surgical needs   FEN - FLD VTE - SCD's, ok for DVT ppx, hold anticoagulation ID - Zosyn 1/11-1/12, discussed with ID, stop abx. No signs of perforation or abscess. No acute c.dif. but high risk for development of c.dif.  monitor exam, vitals, WBC. Admit - [facility] service    Recent admission for LGIB  Hx recurrent c.dif - followed by ID, last seen in November,  improving; c.dif pending  CHF ESRD on HD DM2 Hypothyroidism ?cirrhosis     LOS: 1 day   I reviewed nursing notes, hospitalist notes, last 24 h vitals and pain scores, last 48 h intake and output, last 24 h labs and trends, and last 24 h imaging results.   Obie Dredge, PA-C [facility] Please see Amion for pager number during day hours 7:00am-4:30pm

## 2022-06-06 NOTE — Progress Notes (Signed)
PROGRESS NOTE    Susan [name]  TTS:[id number] DOB: [birth year] DOA: 06/05/2022 PCP: [name], MD   Brief Narrative: 72 year old with past medical history significant for hypertension, hyperlipidemia, PAF not on anticoagulation, diastolic heart failure, CAD, ESRD on hemodialysis MWF, chronic respiratory failure on oxygen, diabetes type 2, hypothyroidism, GI bleed, history of recurrent C. difficile colitis who presents with complaint of lower abdominal pain that is started the day prior to admission.  She also reported nausea, vomiting and diarrhea.  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 Sharol Given 1/12.  Of note recent admission from 12/28 until 1 8//2024 for acute lower GI bleed.  GI tried to perform flexible sigmoidoscopy x 2 which were unsuccessful.  General surgery was consulted and patient underwent exam under anesthesia with oversewing of rectal ulcer found on 05/24/2022 by Dr. Rich Number.  She had a total of 4 units of packed red blood cell transfusion during that hospitalization and Eliquis was recommended to be discontinued.    Assessment & Plan:   Principal Problem:   Diverticulitis Active Problems:   Stercoral colitis   C. difficile colitis   Perisplenic hematoma   Type 2 diabetes mellitus with diabetic polyneuropathy, with long-term current use of insulin ([place])   ESRD on dialysis (HCC)   Paroxysmal atrial fibrillation (H[facility])   Wound dehiscence, surgical   Anemia of chronic disease   Hypothyroidism   Prolonged QT interval  Severe Constipation:  C. difficile colitis: ruled out  Stercoral colitis, sigmoid diverticulitis CT: Showed large stool ball in the rectum with a scattered rectal wall pneumatosis concerning for a stercoral colitis and noted of sigmoid diverticulosis with acute diverticulitis. General surgery consulted recommended laxative. They  don't think patient needs IV antibiotics.  ID doesn't think patient has C diff colitis. Plan to discontinue  vancomycin.      Perisplenic collection: -CT abdomen pelvis with contrast noted 6 x 4 x 2 cm complex subcapsular collection in the superior aspect of the spleen with concern for hematoma versus infectious process. Monitor hemoglobin Surgery, think possible spontaneous subcapsular hematoma, lower suspicious for infectious collection as there is no gas.  Continue to monitor hemoglobin.  -Will hold aspirin and DVT prophylaxis.  Needs imagine follow up out patient for splenic hematoma.   Diabetes type 2 uncontrolled with hyperglycemia Last A1c 8.9 Continue with same clean 20 units sliding scale insulin Home regimen includes Lantus 10 units nightly, Tradjenta 5 units   ESRD, HD  M,W,F Nephrology consulted.   Paroxysmal A-fib: Continue amiodarone   Wound dehiscence,  status post right BKA Patient is scheduled to have revision of prior BKA due to wound dehiscence  by Dr. Sharol Given. Dr. Sharol Given has canceled procedure for today.  Anemia of chronic disease: Hb baseline 8.9 Continue to monitor  Prolonged QT: Avoid prolonged QT medications.  Hypothyroidism: Continue with Synthroid.  History of GI bleed: Patient presented to the hospital last month with GI bleed.  Ultimately underwent exam under anesthesia with oversewing of rectal ulcer found on 05/24/2022 by Dr. Rich Number.   Hyponatremia; treat with HD   Pressure Injury 05/23/22 Buttocks Right Deep Tissue Pressure Injury - Purple or maroon localized area of discolored intact skin or blood-filled blister due to damage of underlying soft tissue from pressure and/or shear. (Active)  05/23/22 1715  Location: Buttocks  Location Orientation: Right  Staging: Deep Tissue Pressure Injury - Purple or maroon localized area of discolored intact skin or blood-filled blister due to damage of underlying soft  tissue from pressure and/or shear.  Wound Description (Comments):   Present on Admission: Yes  Dressing Type Foam - Lift dressing to assess  site every shift 05/29/22 0800     Pressure Injury 05/23/22 Right;Left;Posterior Stage 1 -  Intact skin with non-blanchable redness of a localized area usually over a bony prominence. (Active)  05/23/22   Location:   Location Orientation: Right;Left;Posterior  Staging: Stage 1 -  Intact skin with non-blanchable redness of a localized area usually over a bony prominence.  Wound Description (Comments):   Present on Admission: Yes  Dressing Type Foam - Lift dressing to assess site every shift 05/29/22 0800      Estimated body mass index is 38.89 kg/m as calculated from the following:   Height as of 05/26/22: '5\' 5"'$  (1.651 m).   Weight as of 05/28/22: 106 kg.   DVT prophylaxis: SCD Code Status: Full code Family Communication: Disposition Plan:  Status is: Inpatient Remains inpatient appropriate because: management of constipation.      Consultants:  ID Surgery  Procedures:  none  Antimicrobials:    Subjective: She report mild abdominal pain.  At home she was having 3 Watery stool per day small  Objective: Vitals:   06/05/22 1744 06/05/22 2115 06/06/22 0112 06/06/22 0508  BP: (!) 113/45 (!) 110/57 (!) 94/45 (!) 102/43  Pulse: 77 72 70 69  Resp: '16 18 15 18  '$ Temp: 98.5 F (36.9 C) 98 F (36.7 C) 97.9 F (36.6 C) 98.2 F (36.8 C)  TempSrc: Oral Oral Oral   SpO2: 98% 100% 100% 100%    Intake/Output Summary (Last 24 hours) at 06/06/2022 0754 Last data filed at 06/06/2022 0500 Gross per 24 hour  Intake 200.48 ml  Output 0 ml  Net 200.48 ml   There were no vitals filed for this visit.  Examination:  General exam: Appears calm and comfortable  Respiratory system: Clear to auscultation. Respiratory effort normal. Cardiovascular system: S1 & S2 heard, RRR. No JVD, murmurs, rubs, gallops or clicks. No pedal edema. Gastrointestinal system: Abdomen is nondistended, soft and nontender. No organomegaly or masses felt. Normal bowel sounds heard. Central nervous system:  Alert and oriented. No focal neurological deficits. Extremities: right BKA cover, left LE with edema mild redness  Data Reviewed: I have personally reviewed following labs and imaging studies  CBC: Recent Labs  Lab 06/05/22 0253 06/06/22 0533  WBC 10.4 10.7*  NEUTROABS 8.6*  --   HGB 8.9* 8.3*  HCT 29.1* 26.5*  MCV 88.2 87.5  PLT 246 811   Basic Metabolic Panel: Recent Labs  Lab 06/05/22 0253 06/06/22 0533  NA 130* 130*  131*  K 3.5 3.7  3.8  CL 93* 93*  93*  CO2 '28 26  26  '$ GLUCOSE 346* 195*  193*  BUN 28* 38*  37*  CREATININE 2.43* 2.99*  3.00*  CALCIUM 8.4* 8.1*  8.1*  MG  --  2.5*  PHOS  --  2.6   GFR: Estimated Creatinine Clearance: 20.3 mL/min (A) (by C-G formula based on SCr of 2.99 mg/dL (H)). Liver Function Tests: Recent Labs  Lab 06/05/22 0253 06/06/22 0533  AST 11*  --   ALT 11  --   ALKPHOS 101  --   BILITOT 0.6  --   PROT 5.8*  --   ALBUMIN 2.0* 1.6*   Recent Labs  Lab 06/05/22 0253  LIPASE 28   No results for input(s): "AMMONIA" in the last 168 hours. Coagulation Profile: No results for  input(s): "INR", "PROTIME" in the last 168 hours. Cardiac Enzymes: No results for input(s): "CKTOTAL", "CKMB", "CKMBINDEX", "TROPONINI" in the last 168 hours. BNP (last 3 results) No results for input(s): "PROBNP" in the last 8760 hours. HbA1C: No results for input(s): "HGBA1C" in the last 72 hours. CBG: Recent Labs  Lab 06/05/22 1744 06/05/22 2114  GLUCAP 371* 305*   Lipid Profile: No results for input(s): "CHOL", "HDL", "LDLCALC", "TRIG", "CHOLHDL", "LDLDIRECT" in the last 72 hours. Thyroid Function Tests: No results for input(s): "TSH", "T4TOTAL", "FREET4", "T3FREE", "THYROIDAB" in the last 72 hours. Anemia Panel: No results for input(s): "VITAMINB12", "FOLATE", "FERRITIN", "TIBC", "IRON", "RETICCTPCT" in the last 72 hours. Sepsis Labs: No results for input(s): "PROCALCITON", "LATICACIDVEN" in the last 168 hours.  Recent Results (from  the past 240 hour(s))  C Difficile Quick Screen w PCR reflex     Status: Abnormal   Collection Time: 06/05/22  9:03 AM   Specimen: STOOL  Result Value Ref Range Status   C Diff antigen POSITIVE (A) NEGATIVE Final   C Diff toxin NEGATIVE NEGATIVE Final   C Diff interpretation Results are indeterminate. See PCR results.  Final    Comment: Performed at [facility], [place] [address], [place], [place] [address]  C. Diff by PCR, Reflexed     Status: Abnormal   Collection Time: 06/05/22  9:03 AM  Result Value Ref Range Status   Toxigenic C. Difficile by PCR POSITIVE (A) NEGATIVE Final    Comment: Positive for toxigenic C. difficile with little to no toxin production. Only treat if clinical presentation suggests symptomatic illness. Performed at [facility], [place] [address], [place], [place] [address]          Radiology Studies: CT ABDOMEN PELVIS WO CONTRAST  Result Date: 06/05/2022 CLINICAL DATA:  Abdominal pain. EXAM: CT ABDOMEN AND PELVIS WITHOUT CONTRAST TECHNIQUE: Multidetector CT imaging of the abdomen and pelvis was performed following the standard protocol without IV contrast. RADIATION DOSE REDUCTION: This exam was performed according to the departmental dose-optimization program which includes automated exposure control, adjustment of the mA and/or kV according to patient size and/or use of iterative reconstruction technique. COMPARISON:  CTA abdomen and pelvis 05/23/2022, CT abdomen and pelvis with oral contrast only 03/27/2022 FINDINGS: Lower chest: There is interval new demonstration of a small layering left pleural effusion. There is overlying atelectasis. There is mild posterior atelectasis in the right lower lobe with a 2 cm pneumatocele again seen. Lung bases are otherwise unremarkable. The cardiac size is normal. There are calcifications in the coronary arteries. Hepatobiliary: There are stones in the gallbladder without wall thickening or bile duct dilatation. There are  calcified granulomas in the liver. Capsular nodularity in the left lobe consistent with cirrhosis. No focal abnormality is seen without contrast. Pancreas: Atrophic and otherwise unremarkable without contrast. Spleen: There are multiple calcified granulomas. The spleen is slightly enlarged. There is a new subcapsular complex hypodense collection in the superior aspect of the spleen measuring 6 x 4.2 x 2 cm. Major differential concerns include a contained subcapsular hematoma and an infectious process. Hounsfield density is difficult to accurately characterize due to streak artifact from the patient's overlying arms, but measures between - 10 and -28 Hounsfield units if accurate. If this is a hematoma there is no perisplenic hemorrhagic products. If this is an abscess, there is no air in it. Adrenals/Urinary Tract: Slight chronic nodular thickening of the adrenal glands. No new adrenal abnormality. Extensive renovascular calcifications at both renal  hila are again shown with bilateral renal cortical thinning. Small renal cysts are similar. No new renal abnormality. No stones or urinary obstruction. Unremarkable bladder. Stomach/Bowel: The stomach contracted and not well seen but no obvious wall thickening suspected. Small-bowel there are increased thickened folds in the left abdominal small bowel but there is no acute inflammatory change or obstruction. The appendix is normal caliber. There is sigmoid diverticulosis with increased wall thickening and inflammatory stranding involving the mid to distal thirds of the sigmoid colon consistent with acute diverticulitis. There is a large stool ball in the rectum not seen previously and scattered rectal wall pneumatosis. Some of the rectal stool is in the process of being expelled. Vascular/Lymphatic: There are extensive visceral arterial calcifications and moderate aortoiliac calcific plaques. This was described in detail in the recent CTA abdomen and pelvis report. There  are mild mesenteric congestive features which have increased in the interval. No abdominal, pelvic or inguinal adenopathy is seen. Reproductive: Status post hysterectomy. No adnexal masses. Other: In addition to increased mesenteric congestion there is increased body wall anasarca. Minimal posterior pelvic ascites is unchanged. There is no free air, free hemorrhage or abscess. No incarcerated hernia. Musculoskeletal: There is osteopenia and degenerative change of the spine. No aggressive bone lesion is seen. IMPRESSION: 1. New 6 x 4.2 x 2 cm complex subcapsular collection in the superior aspect of the spleen. Major differential concerns include a contained subcapsular hematoma and an infectious process. If this is a hematoma there are no perisplenic hemorrhagic products. If this is an abscess, there is no air in it. 2. New small left pleural effusion with overlying atelectasis. 3. Increased mesenteric congestion and body wall anasarca. 4. Increased thickened folds in the left abdominal small bowel without acute inflammatory change. This could be due to mesenteric congestion or enteritis. 5. Large stool ball in the rectum with scattered rectal wall pneumatosis, the latter which could indicate ischemic changes or be due to stercoral proctitis. 6. Sigmoid diverticulosis with increased wall thickening and inflammatory stranding in the mid to distal sigmoid consistent with acute diverticulitis. No free air or free hemorrhage. 7. Cholelithiasis. 8. Aortic and coronary artery atherosclerosis. 9. Capsular nodularity in the left lobe of liver consistent with cirrhosis. Aortic Atherosclerosis (ICD10-I70.0). Electronically Signed   By: Telford Nab M.D.   On: 06/05/2022 04:21        Scheduled Meds:  amiodarone  200 mg Oral Daily   aspirin EC  81 mg Oral Q breakfast   heparin  5,000 Units Subcutaneous Q8H   insulin aspart  0-15 Units Subcutaneous TID WC   insulin aspart  0-5 Units Subcutaneous QHS   insulin  glargine-yfgn  20 Units Subcutaneous QHS   levothyroxine  50 mcg Oral QAC breakfast   midodrine  10 mg Oral Q M,W,F-HD   midodrine  5 mg Oral TID WC   multivitamin  1 tablet Oral Daily   pantoprazole  40 mg Oral Daily   pregabalin  75 mg Oral Daily   saccharomyces boulardii  500 mg Oral BID   sevelamer carbonate  1,600 mg Oral TID WC   sodium chloride flush  3 mL Intravenous Q12H   vancomycin  125 mg Oral QID   Followed by   Derrill Memo ON 06/19/2022] vancomycin  125 mg Oral BID   Followed by   Derrill Memo ON 06/27/2022] vancomycin  125 mg Oral Daily   Followed by   Derrill Memo ON 07/04/2022] vancomycin  125 mg Oral QODAY   Followed by   [  START ON 07/12/2022] vancomycin  125 mg Oral Q3 days   vortioxetine HBr  20 mg Oral q AM   Continuous Infusions:  piperacillin-tazobactam (ZOSYN)  IV 2.25 g (06/06/22 0112)     LOS: 1 day    Time spent: 35 minutes.    Elmarie Shiley, MD [facility]   If 7PM-7AM, please contact night-coverage [email or web address]  06/06/2022, 7:54 AM

## 2022-06-06 NOTE — Consult Note (Signed)
Renal Service Consult Note [facility]  Perris Tripathi 06/06/2022 Sol Blazing, MD Requesting Physician: Dr. Tyrell Antonio  Reason for Consult: ESRD pt w/ abd pain HPI: The patient is a 72 y.o. year-old w/ PMH as below who presented to ED yesterday from her SNF for lower abd pain. No diarrhea. Recent Cdif infection. In ED pt afeb, ^RR, SpO2 okay on 3 L (wears O2 at home ). Hb 8.9, CT scan showed complex splenic subcapsular collection, new L effusion, body wall anasarca, large stool ball w/ rectal wall pneumatosis, acute diverticulitis and cirrhosis appearance to the liver. Pt was given IV abx and admitted.  We are asked to see for dialysis.   Pt seen in room. Pt is pleasant, no current pain, no cough or CP or SOB. Her husband is at home. When at home was taking [facility] transportation in [place] but it was "too expensive". For now her SNF is providing transportation to HD.    ROS - denies CP, no joint pain, no HA, no blurry vision, no rash, no diarrhea, no nausea/ vomiting   Past Medical History  Past Medical History:  Diagnosis Date   Acute MI ([place]) 1999; 2007; 03/05/21   Anemia    hx   Anginal pain (HCC)    Anxiety    ARF (acute renal failure) ([place]) 06/2017   [facility]   Arthritis    "generalized" (03/15/2014)   CAD (coronary artery disease)    MI in 2000 - MI  2007 - treated bare metal stent (no nuclear since then as 9/11)   Carotid artery disease (HCC)    CHF (congestive heart failure) ([place])    HFrEF 03/06/21   Chronic diastolic heart failure ([place])    a) ECHO (08/2013) EF 55-60% and RV function nl b) RHC (08/2013) RA 4, RV 30/5/7, PA 25/10 (16), PCWP 7, Fick CO/CI 6.3/2.7, PVR 1.5 WU, PA 61 and 66%   Daily headache    "~ every other day; since I fell in June" (03/15/2014)   Depression    Dyslipidemia    Dyspnea    uses oxygen qhs via [place]   ESRD (end stage renal disease) ([place])    Dialysis on Tues Thurs Sat   Exertional shortness of breath     History of kidney stones    HTN (hypertension)    Hypothyroidism    Neuropathy    Obesity    Osteoarthritis    PAF (paroxysmal atrial fibrillation) (HCC)    Peripheral neuropathy    bilateral feet/hands   PONV (postoperative nausea and vomiting)    RBBB (right bundle branch block)    Old   Stroke ([place])    mini strokes   Syncope    likely due to low blood sugar   Tachycardia    Sinus tachycardia   Type II diabetes mellitus (HCC)    Type II, Lamar Sprinkles libre left upper arm. patient has omnipod insulin pump with Novolin R Insulin   Urinary incontinence    Venous insufficiency    Past Surgical History  Past Surgical History:  Procedure Laterality Date   ABDOMINAL HYSTERECTOMY  1980's   AMPUTATION Right 02/24/2018   Procedure: RIGHT FOOT GREAT TOE AND 2ND TOE AMPUTATION;  Surgeon: Newt Minion, MD;  Location: [place];  Service: Orthopedics;  Laterality: Right;   AMPUTATION Right 04/30/2018   Procedure: RIGHT TRANSMETATARSAL AMPUTATION;  Surgeon: Newt Minion, MD;  Location: [place];  Service: Orthopedics;  Laterality: Right;   AMPUTATION  Right 05/02/2022   Procedure: RIGHT BELOW KNEE AMPUTATION;  Surgeon: Newt Minion, MD;  Location: [place];  Service: Orthopedics;  Laterality: Right;   AV FISTULA PLACEMENT Left 04/02/2022   Procedure: LEFT ARM ARTERIOVENOUS (AV) FISTULA CREATION;  Surgeon: Serafina Mitchell, MD;  Location: [place];  Service: Vascular;  Laterality: Left;  PERIPHERAL NERVE BLOCK   BIOPSY  05/27/2020   Procedure: BIOPSY;  Surgeon: [name], DO;  [facility];  Service: Endoscopy;;   CATARACT EXTRACTION, BILATERAL Bilateral ?2013   COLONOSCOPY W/ POLYPECTOMY     COLONOSCOPY WITH PROPOFOL N/A 03/13/2019   Procedure: COLONOSCOPY WITH PROPOFOL;  Surgeon: Jerene Bears, MD;  Location: [place];  Service: Gastroenterology;  Laterality: N/A;   CORONARY ANGIOPLASTY WITH STENT PLACEMENT  1999; 2007   "1 + 1"   ERCP N/A 02/03/2022   Procedure: ENDOSCOPIC  RETROGRADE CHOLANGIOPANCREATOGRAPHY (ERCP);  Surgeon: Carol Ada, MD;  Location: [place];  Service: Gastroenterology;  Laterality: N/A;   ESOPHAGOGASTRODUODENOSCOPY (EGD) WITH PROPOFOL N/A 03/13/2019   Procedure: ESOPHAGOGASTRODUODENOSCOPY (EGD) WITH PROPOFOL;  Surgeon: Jerene Bears, MD;  Location: [facility] ENDOSCOPY;  Service: Gastroenterology;  Laterality: N/A;   ESOPHAGOGASTRODUODENOSCOPY (EGD) WITH PROPOFOL N/A 05/27/2020   Procedure: ESOPHAGOGASTRODUODENOSCOPY (EGD) WITH PROPOFOL;  Surgeon: [name], DO;  [facility];  Service: Endoscopy;  Laterality: N/A;   EYE SURGERY Bilateral    lazer   FLEXIBLE SIGMOIDOSCOPY N/A 05/23/2022   Procedure: FLEXIBLE SIGMOIDOSCOPY;  Surgeon: Carol Ada, MD;  Location: [place];  Service: Gastroenterology;  Laterality: N/A;   FLEXIBLE SIGMOIDOSCOPY N/A 05/24/2022   Procedure: FLEXIBLE SIGMOIDOSCOPY;  Surgeon: Sharyn Creamer, MD;  Location: [place];  Service: Gastroenterology;  Laterality: N/A;   HEMOSTASIS CLIP PLACEMENT  03/13/2019   Procedure: HEMOSTASIS CLIP PLACEMENT;  Surgeon: Jerene Bears, MD;  Location: [facility] ENDOSCOPY;  Service: Gastroenterology;;   HEMOSTASIS CLIP PLACEMENT  05/23/2022   Procedure: HEMOSTASIS CLIP PLACEMENT;  Surgeon: Carol Ada, MD;  Location: [place];  Service: Gastroenterology;;   HEMOSTASIS CONTROL  05/24/2022   Procedure: HEMOSTASIS CONTROL;  Surgeon: Sharyn Creamer, MD;  Location: [place];  Service: Gastroenterology;;   HOT HEMOSTASIS N/A 05/23/2022   Procedure: HOT HEMOSTASIS (ARGON PLASMA COAGULATION/BICAP);  Surgeon: Carol Ada, MD;  Location: [place];  Service: Gastroenterology;  Laterality: N/A;   I & D EXTREMITY Left 05/05/2022   Procedure: IRRIGATION AND DEBRIDEMENT LEFT ARM AV FISTULA;  Surgeon: Marty Heck, MD;  Location: [place];  Service: Vascular;  Laterality: Left;   INSERTION OF DIALYSIS CATHETER Right 04/02/2022   Procedure: INSERTION OF TUNNELED  DIALYSIS CATHETER;  Surgeon: Serafina Mitchell, MD;  [facility];  Service: Vascular;  Laterality: Right;   KNEE ARTHROSCOPY Left 10/25/2006   POLYPECTOMY  03/13/2019   Procedure: POLYPECTOMY;  Surgeon: Jerene Bears, MD;  Location: [place] ENDOSCOPY;  Service: Gastroenterology;;   REMOVAL OF STONES  02/03/2022   Procedure: REMOVAL OF STONES;  Surgeon: Carol Ada, MD;  Location: [place];  Service: Gastroenterology;;   RIGHT HEART CATH N/A 07/24/2017   Procedure: RIGHT HEART CATH;  Surgeon: Jolaine Artist, MD;  Location: [place] CV LAB;  Service: Cardiovascular;  Laterality: N/A;   RIGHT HEART CATHETERIZATION N/A 09/22/2013   Procedure: RIGHT HEART CATH;  Surgeon: Jolaine Artist, MD;  Location: [facility];  Service: Cardiovascular;  Laterality: N/A;   SHOULDER ARTHROSCOPY WITH OPEN ROTATOR CUFF REPAIR Right 03/14/2014   Procedure: RIGHT SHOULDER ARTHROSCOPY WITH BICEPS RELEASE,  OPEN SUBSCAPULA REPAIR, OPEN SUPRASPINATUS REPAIR.;  Surgeon: Meredith Pel, MD;  Location: [place];  Service: Orthopedics;  Laterality: Right;   SPHINCTEROTOMY  02/03/2022   Procedure: SPHINCTEROTOMY;  Surgeon: Carol Ada, MD;  Location: [place];  Service: Gastroenterology;;   TEE WITHOUT CARDIOVERSION N/A 02/04/2022   Procedure: TRANSESOPHAGEAL ECHOCARDIOGRAM (TEE);  Surgeon: Jolaine Artist, MD;  Location: [facility] ENDOSCOPY;  Service: Cardiovascular;  Laterality: N/A;   TOE AMPUTATION Right 02/24/2018   GREAT TOE AND 2ND TOE AMPUTATION   TUBAL LIGATION  61's   Family History  Family History  Problem Relation Age of Onset   Heart attack Mother 59   Social History  reports that she quit smoking about 24 years ago. Her smoking use included cigarettes. She has a 96.00 pack-year smoking history. She has never used smokeless tobacco. She reports that she does not currently use alcohol. She reports that she does not use drugs. Allergies  Allergies  Allergen Reactions   Cephalexin Diarrhea and  Other (See Comments)   Codeine Nausea And Vomiting and Other (See Comments)   Home medications Prior to Admission medications   Medication Sig Start Date End Date Taking? Authorizing Provider  albuterol (VENTOLIN HFA) 108 (90 Base) MCG/ACT inhaler Inhale 1-2 puffs into the lungs every 6 (six) hours as needed for wheezing or shortness of breath.   Yes [provider]  amiodarone (PACERONE) 200 MG tablet Take 1 tablet (200 mg total) by mouth daily. 01/29/22  Yes [place], Maricela Bo, FNP  aspirin EC 81 MG tablet Take 1 tablet (81 mg total) by mouth daily with breakfast. 06/04/22  Yes Pahwani, Einar Grad, MD  B Complex-C-Folic Acid (DIALYVITE TABLET) TABS Take 1 tablet by mouth in the morning. (1000) 04/22/22  Yes [provider]  diphenoxylate-atropine (LOMOTIL) 2.5-0.025 MG tablet Take 1 tablet by mouth 4 (four) times daily as needed for diarrhea or loose stools. 08/19/21  Yes [name], MD  ferrous sulfate 325 (65 FE) MG tablet Take 325 mg by mouth 2 (two) times daily. (1000 & 2100)   Yes [provider]  folic acid-vitamin b complex-vitamin c-selenium-zinc (DIALYVITE) 3 MG TABS tablet Take 1 tablet by mouth daily.   Yes [provider]  hydrALAZINE (APRESOLINE) 25 MG tablet Take 1 tablet (25 mg total) by mouth every 6 (six) hours as needed (SBP>150 or DBP>100). 04/06/22  Yes Nita Sells, MD  hydrocortisone (ANUSOL-HC) 2.5 % rectal cream Place 1 Application rectally 2 (two) times daily. (1000 & 2100)   Yes [provider]  insulin aspart (NOVOLOG FLEXPEN) 100 UNIT/ML FlexPen Inject 0-15 Units into the skin See admin instructions. Inject 5 units subcutaneously 3 times daily with meals (0900, 1300 & 1700) & as needed via sliding scale insulin CBG <70=Notify NP/MD 71-149=0 units,150-199=4 units, 200-249=6 units,250- 299=10 units, 300-349=12 units, 350-399=15 units, > 400 call MD ( prime pen with 2 units prior  to set dose)   Yes [provider]   insulin glargine (LANTUS SOLOSTAR) 100 UNIT/ML Solostar Pen Inject 10 Units into the skin at bedtime.   Yes [provider]  levothyroxine (SYNTHROID, LEVOTHROID) 50 MCG tablet Take 1 tablet (50 mcg total) by mouth daily before breakfast. 11/07/16  Yes Dena Billet B, PA-C  linagliptin (TRADJENTA) 5 MG TABS tablet Take 1 tablet (5 mg total) by mouth daily. 04/07/22  Yes Nita Sells, MD  magnesium oxide (MAG-OX) 400 (240 Mg) MG tablet Take 1 tablet (400 mg total) by mouth daily. 07/18/21  Yes [name],  [name], MD  midodrine (PROAMATINE) 10 MG tablet Take 1 tablet (10 mg total) by mouth every Monday, Wednesday, and Friday with hemodialysis. 05/30/22 06/29/22 Yes Pahwani, Einar Grad, MD  midodrine (PROAMATINE) 5 MG tablet Take 1 tablet (5 mg total) by mouth 3 (three) times daily with meals. 05/28/22 06/27/22 Yes Pahwani, Einar Grad, MD  Nutritional Supplements (FEEDING SUPPLEMENT, GLUCERNA 1.2 CAL,) LIQD Take 237 mLs by mouth daily.   Yes [provider]  ondansetron (ZOFRAN) 4 MG tablet Take 4 mg by mouth every 8 (eight) hours as needed for nausea.   Yes [provider]  oxyCODONE-acetaminophen (PERCOCET/ROXICET) 5-325 MG tablet Take 1 tablet by mouth every 4 (four) hours as needed. 05/28/22  Yes Pahwani, Einar Grad, MD  OXYGEN Inhale 2 L/min into the lungs at bedtime.   Yes [provider]  pantoprazole (PROTONIX) 40 MG tablet Take 1 tablet (40 mg total) by mouth daily. 05/28/22 05/28/23 Yes Pahwani, Einar Grad, MD  polyethylene glycol (MIRALAX / GLYCOLAX) 17 g packet Take 17 g by mouth in the morning. (1000)   Yes [provider]  pregabalin (LYRICA) 75 MG capsule Take 1 capsule (75 mg total) by mouth daily. 04/07/22  Yes Nita Sells, MD  saccharomyces boulardii (FLORASTOR) 250 MG capsule Take 500 mg by mouth 2 (two) times daily. X14 days   Yes [provider]  senna-docusate (SENOKOT-S) 8.6-50 MG tablet Take 2 tablets by mouth at bedtime. (2200)   Yes [provider]  sevelamer carbonate (RENVELA) 800 MG tablet Take 1,600 mg by mouth 3 (three) times daily with meals. (0800, 1200 & 1700)   Yes [provider]  vortioxetine HBr (TRINTELLIX) 20 MG TABS tablet Take 20 mg by mouth in the morning. (1000)   Yes [provider]  blood glucose meter kit and supplies Use up to four times daily as directed. 01/23/22   Ghimire, Henreitta Leber, MD  Continuous Blood Gluc Sensor (FREESTYLE LIBRE 2 SENSOR) MISC  01/14/22   [provider]  Fingerstix Lancets MISC Use as directed to check blood sugars as need up to 4 times daily 01/23/22   Ghimire, Henreitta Leber, MD  Insulin Pen Needle 32G X 4 MM MISC Use as directed 01/23/22   Jonetta Osgood, MD  vortioxetine HBr (TRINTELLIX) 10 MG TABS tablet Take 2 tablets (20 mg total) by mouth daily. Patient not taking: Reported on 06/05/2022 04/23/22   [name] T, MD     Vitals:   06/05/22 2115 06/06/22 0112 06/06/22 0508 06/06/22 0800  BP: (!) 110/57 (!) 94/45 (!) 102/43 (!) 108/45  Pulse: 72 70 69 82  Resp: '18 15 18 19  '$ Temp: 98 F (36.7 C) 97.9 F (36.6 C) 98.2 F (36.8 C) 98.1 F (36.7 C)  TempSrc: Oral Oral  Oral  SpO2: 100% 100% 100% 100%   Exam Gen alert, no distress No rash, cyanosis or gangrene Sclera anicteric, throat clear  No jvd or bruits Chest clear bilat to bases, no rales/ wheezing RRR no MRG Abd soft ntnd no mass or ascites +bs GU defer MS no joint effusions or deformity Ext 1-2+ LLE pitting hip > pretib edema, L lower leg reddened  R BKA also 1-2+ hip edema on R Neuro is alert, Ox 3 , nf    RIJ TDC intact   Home meds include - amiodarone, aspirin, hydralazine 25 qid prn, insulin aspart/ glargine, synthroid, linagliptin, midodrine '10mg'$  pre hd mwf, percocet prn, protonix, lyrica 75 qd, renvela 2 ac tid, trintellix, prns/ vits/  supps     OP HD: SW MWF  4h  400/800   97kg   3K/2.5Ca bath  TDC  Hep none - last HD 1/10, post wt 98.8 - mircera 150 mcg q2, last  1/08, due 1/22   Assessment/ Plan: Abd pain/ stercoral colitis/ acute diverticulitis - per abd CT. Hx of recent Cdif infection. Po vanc started for suspected Cdif, gen surg consulted.  DM2 - uncont, per pmd ESRD - on HD MWF. Plan is for HD today.  HTN/ volume - sig LE / hip edema on examm, no resp issues. BP's are soft. Get a good bed weight.  Max UF as tolerated w/ HD tomorrow.  Anemia esrd - Hb 8-9 range here, next ESA due on Jan 22. Transfuse prn.  MBD ckd - CCa in range, also phos. Not on vdra. Cont renvela 2 ac as binder.  PAF - no longer on a/c due to hx of GIB H/o PAD - sp R AKA Recent bleeding rectal ulcer - rx'd surgically dec 2023      Kelly Splinter  MD 06/06/2022, 2:26 PM Recent Labs  Lab 06/05/22 0253 06/06/22 0533  HGB 8.9* 8.3*  ALBUMIN 2.0* 1.6*  CALCIUM 8.4* 8.1*  8.1*  PHOS  --  2.6  CREATININE 2.43* 2.99*  3.00*  K 3.5 3.7  3.8   Inpatient medications:  amiodarone  200 mg Oral Daily   docusate sodium  100 mg Oral BID   Gerhardt's butt cream   Topical TID   insulin aspart  0-15 Units Subcutaneous TID WC   insulin aspart  0-5 Units Subcutaneous QHS   insulin glargine-yfgn  20 Units Subcutaneous QHS   levothyroxine  50 mcg Oral QAC breakfast   midodrine  10 mg Oral Q M,W,F-HD   midodrine  5 mg Oral TID WC   multivitamin  1 tablet Oral Daily   pantoprazole  40 mg Oral Daily   polyethylene glycol  17 g Oral BID   pregabalin  75 mg Oral Daily   saccharomyces boulardii  500 mg Oral BID   sevelamer carbonate  1,600 mg Oral TID WC   sodium chloride flush  3 mL Intravenous Q12H   vortioxetine HBr  20 mg Oral q AM    acetaminophen **OR** acetaminophen, albuterol, fentaNYL (SUBLIMAZE) injection, hydrALAZINE, oxyCODONE-acetaminophen, trimethobenzamide

## 2022-06-06 NOTE — Inpatient Diabetes Management (Signed)
Inpatient Diabetes Program Recommendations  AACE/ADA: New Consensus Statement on Inpatient Glycemic Control (2015)  Target Ranges:  Prepandial:   less than 140 mg/dL      Peak postprandial:   less than 180 mg/dL (1-2 hours)      Critically ill patients:  140 - 180 mg/dL   Lab Results  Component Value Date   GLUCAP 210 (H) 06/06/2022   HGBA1C 8.9 (H) 05/02/2022    Review of Glycemic Control  Latest Reference Range & Units 06/05/22 17:44 06/05/22 21:14 06/06/22 08:03  Glucose-Capillary 70 - 99 mg/dL 371 (H) 305 (H) 210 (H)   Diabetes history: DM  Outpatient Diabetes medications:  Omnipod insulin pump (total basal 25.9 units/day, Carb coverage 1:12 grams, Sensitivity factor 1:40 mg/dl ) not on insulin pump at SNF Current orders for Inpatient glycemic control:   Semglee 20 units qhs Novolog 0-15 units tid + hs   Inpatient Diabetes Program Recommendations:    -   Increase Semglee to 25 units   Patient is familiar to our team and has been seen numerous times in her last few admissions.   Thanks,  Tama Headings RN, MSN, BC-ADM Inpatient Diabetes Coordinator Team Pager [phone] (8a-5p)

## 2022-06-07 ENCOUNTER — Inpatient Hospital Stay (HOSPITAL_COMMUNITY): Payer: PPO

## 2022-06-07 DIAGNOSIS — K5792 Diverticulitis of intestine, part unspecified, without perforation or abscess without bleeding: Secondary | ICD-10-CM | POA: Diagnosis not present

## 2022-06-07 DIAGNOSIS — K5900 Constipation, unspecified: Secondary | ICD-10-CM

## 2022-06-07 DIAGNOSIS — K5289 Other specified noninfective gastroenteritis and colitis: Secondary | ICD-10-CM | POA: Diagnosis not present

## 2022-06-07 LAB — CBC
HCT: 26.3 % — ABNORMAL LOW (ref 36.0–46.0)
Hemoglobin: 7.9 g/dL — ABNORMAL LOW (ref 12.0–15.0)
MCH: 26.4 pg (ref 26.0–34.0)
MCHC: 30 g/dL (ref 30.0–36.0)
MCV: 88 fL (ref 80.0–100.0)
Platelets: 201 10*3/uL (ref 150–400)
RBC: 2.99 MIL/uL — ABNORMAL LOW (ref 3.87–5.11)
RDW: 19.3 % — ABNORMAL HIGH (ref 11.5–15.5)
WBC: 9.6 10*3/uL (ref 4.0–10.5)
nRBC: 0 % (ref 0.0–0.2)

## 2022-06-07 LAB — BASIC METABOLIC PANEL
Anion gap: 8 (ref 5–15)
BUN: 20 mg/dL (ref 8–23)
CO2: 27 mmol/L (ref 22–32)
Calcium: 7.9 mg/dL — ABNORMAL LOW (ref 8.9–10.3)
Chloride: 94 mmol/L — ABNORMAL LOW (ref 98–111)
Creatinine, Ser: 2.25 mg/dL — ABNORMAL HIGH (ref 0.44–1.00)
GFR, Estimated: 22 mL/min — ABNORMAL LOW (ref >60)
Glucose, Bld: 112 mg/dL — ABNORMAL HIGH (ref 70–99)
Potassium: 3.6 mmol/L (ref 3.5–5.1)
Sodium: 129 mmol/L — ABNORMAL LOW (ref 135–145)

## 2022-06-07 LAB — RENAL FUNCTION PANEL
Albumin: 1.7 g/dL — ABNORMAL LOW (ref 3.5–5.0)
Anion gap: 7 (ref 5–15)
BUN: 18 mg/dL (ref 8–23)
CO2: 28 mmol/L (ref 22–32)
Calcium: 7.8 mg/dL — ABNORMAL LOW (ref 8.9–10.3)
Chloride: 94 mmol/L — ABNORMAL LOW (ref 98–111)
Creatinine, Ser: 2.04 mg/dL — ABNORMAL HIGH (ref 0.44–1.00)
GFR, Estimated: 25 mL/min — ABNORMAL LOW (ref >60)
Glucose, Bld: 135 mg/dL — ABNORMAL HIGH (ref 70–99)
Phosphorus: 1.7 mg/dL — ABNORMAL LOW (ref 2.5–4.6)
Potassium: 4 mmol/L (ref 3.5–5.1)
Sodium: 129 mmol/L — ABNORMAL LOW (ref 135–145)

## 2022-06-07 LAB — HEMOGLOBIN AND HEMATOCRIT, BLOOD
HCT: 27.9 % — ABNORMAL LOW (ref 36.0–46.0)
Hemoglobin: 8.7 g/dL — ABNORMAL LOW (ref 12.0–15.0)

## 2022-06-07 LAB — GLUCOSE, CAPILLARY
Glucose-Capillary: 107 mg/dL — ABNORMAL HIGH (ref 70–99)
Glucose-Capillary: 107 mg/dL — ABNORMAL HIGH (ref 70–99)
Glucose-Capillary: 262 mg/dL — ABNORMAL HIGH (ref 70–99)
Glucose-Capillary: 259 mg/dL — ABNORMAL HIGH (ref 70–99)

## 2022-06-07 MED ORDER — AMOXICILLIN-POT CLAVULANATE 875-125 MG PO TABS: 1.0000 | ORAL_TABLET | Freq: Two times a day (BID) | ORAL | Status: DC

## 2022-06-07 MED ORDER — VANCOMYCIN HCL 125 MG PO CAPS
125.0000 mg | ORAL_CAPSULE | Freq: Four times a day (QID) | ORAL | Status: DC
  Administered 2022-06-07 – 2022-06-11 (×16): 125 mg via ORAL
  Filled 2022-06-07 (×17): qty 1

## 2022-06-07 MED ORDER — AMOXICILLIN-POT CLAVULANATE 500-125 MG PO TABS
1.0000 | ORAL_TABLET | Freq: Two times a day (BID) | ORAL | Status: AC
  Administered 2022-06-07 – 2022-06-09 (×6): 1 via ORAL
  Filled 2022-06-07 (×6): qty 1

## 2022-06-07 MED ORDER — SENNA 8.6 MG PO TABS
1.0000 | ORAL_TABLET | Freq: Every day | ORAL | Status: DC
  Administered 2022-06-07 – 2022-06-17 (×9): 8.6 mg via ORAL
  Filled 2022-06-07 (×9): qty 1

## 2022-06-07 MED ORDER — GUAIFENESIN ER 600 MG PO TB12
600.0000 mg | ORAL_TABLET | Freq: Two times a day (BID) | ORAL | Status: DC
  Administered 2022-06-07 – 2022-06-09 (×6): 600 mg via ORAL
  Filled 2022-06-07 (×6): qty 1

## 2022-06-07 MED ORDER — IPRATROPIUM-ALBUTEROL 0.5-2.5 (3) MG/3ML IN SOLN
3.0000 mL | Freq: Four times a day (QID) | RESPIRATORY_TRACT | Status: DC
  Administered 2022-06-07 – 2022-06-08 (×4): 3 mL via RESPIRATORY_TRACT
  Filled 2022-06-07 (×4): qty 3

## 2022-06-07 NOTE — Progress Notes (Signed)
[facility] Progress Note   Subjective:    Seen and examined patient at bedside. Tolerated yesterday's HD with net UF 3.5L. She is reporting trouble sleeping, feeling anxious, and SOB. She doesn't appear in acute respiratory distress. On 4L O2 [place]. Also reports mild ABD pain. Followed by ortho and general surgery.  Objective Vitals:   06/06/22 2103 06/06/22 2146 06/07/22 0536 06/07/22 0936  BP:  (!) 138/47 (!) 124/52 (!) 121/32  Pulse:  78 78 70  Resp:  '20 16 17  '$ Temp:  97.6 F (36.4 C) 97.8 F (36.6 C) 97.6 F (36.4 C)  TempSrc:  Oral Oral Oral  SpO2:  99% 98% 100%  Weight: 88.8 kg      Physical Exam General: Awake, alert, NAD, on 4L O2 [place] Heart: S1 and S2; No murmurs, gallops, or rubs Lungs: Diminished at bases, clear in uppers bilaterally  Abdomen: Tender to palpation low-mid ABD and LUQ Extremities:R BKA; 1+ edema LLE with redness/scabs Dialysis Access: [facility]   Filed Weights   06/06/22 1623 06/06/22 2103  Weight: 94.7 kg 88.8 kg    Intake/Output Summary (Last 24 hours) at 06/07/2022 1147 Last data filed at 06/07/2022 1014 Gross per 24 hour  Intake 660 ml  Output 3500 ml  Net -2840 ml    Additional Objective Labs: Basic Metabolic Panel: Recent Labs  Lab 06/06/22 0533 06/07/22 0141 06/07/22 0934  NA 130*  131* 129* 129*  K 3.7  3.8 4.0 3.6  CL 93*  93* 94* 94*  CO2 '26  26 28 27  '$ GLUCOSE 195*  193* 135* 112*  BUN 38*  37* 18 20  CREATININE 2.99*  3.00* 2.04* 2.25*  CALCIUM 8.1*  8.1* 7.8* 7.9*  PHOS 2.6 1.7*  --    Liver Function Tests: Recent Labs  Lab 06/05/22 0253 06/06/22 0533 06/07/22 0141  AST 11*  --   --   ALT 11  --   --   ALKPHOS 101  --   --   BILITOT 0.6  --   --   PROT 5.8*  --   --   ALBUMIN 2.0* 1.6* 1.7*   Recent Labs  Lab 06/05/22 0253  LIPASE 28   CBC: Recent Labs  Lab 06/05/22 0253 06/06/22 0533 06/07/22 0141  WBC 10.4 10.7* 9.6  NEUTROABS 8.6*  --   --   HGB 8.9* 8.3* 7.9*  HCT 29.1*  26.5* 26.3*  MCV 88.2 87.5 88.0  PLT 246 187 201   Blood Culture    Component Value Date/Time   SDES BLOOD RIGHT ARM 06/05/2022 0921   SPECREQUEST  06/05/2022 0921    BOTTLES DRAWN AEROBIC AND ANAEROBIC Blood Culture results may not be optimal due to an inadequate volume of blood received in culture bottles   CULT  06/05/2022 0921    NO GROWTH 2 DAYS Performed at [facility], [address], [place], [place] [address]    REPTSTATUS PENDING 06/05/2022 1700    Cardiac Enzymes: No results for input(s): "CKTOTAL", "CKMB", "CKMBINDEX", "TROPONINI" in the last 168 hours. CBG: Recent Labs  Lab 06/06/22 0803 06/06/22 1132 06/06/22 2200 06/07/22 0738 06/07/22 1127  GLUCAP 210* 168* 154* 107* 107*   Iron Studies: No results for input(s): "IRON", "TIBC", "TRANSFERRIN", "FERRITIN" in the last 72 hours. Lab Results  Component Value Date   INR 1.4 (H) 05/22/2022   INR 1.5 (H) 02/01/2022   INR 1.3 (H) 01/14/2022   Studies/Results: DG CHEST PORT 1 VIEW  Result Date: 06/07/2022 CLINICAL DATA:  Shortness of breath EXAM: PORTABLE CHEST 1 VIEW COMPARISON:  Chest x-ray May 26, 2022 FINDINGS: Cardiomediastinal silhouette is stable. No pneumothorax. No nodules or masses. No focal infiltrates. Stable right central line terminating in the SVC. No overt edema. IMPRESSION: No active disease. Electronically Signed   By: Dorise Bullion III M.D.   On: 06/07/2022 10:49    Medications:   amiodarone  200 mg Oral Daily   Chlorhexidine Gluconate Cloth  6 each Topical Q0600   docusate sodium  100 mg Oral BID   Gerhardt's butt cream   Topical TID   insulin aspart  0-15 Units Subcutaneous TID WC   insulin aspart  0-5 Units Subcutaneous QHS   insulin glargine-yfgn  20 Units Subcutaneous QHS   ipratropium-albuterol  3 mL Nebulization Q6H   levothyroxine  50 mcg Oral QAC breakfast   midodrine  10 mg Oral Q M,W,F-HD   midodrine  5 mg Oral TID WC   multivitamin  1 tablet Oral Daily    pantoprazole  40 mg Oral Daily   polyethylene glycol  17 g Oral BID   pregabalin  75 mg Oral Daily   saccharomyces boulardii  500 mg Oral BID   sevelamer carbonate  1,600 mg Oral TID WC   sodium chloride flush  3 mL Intravenous Q12H   vortioxetine HBr  20 mg Oral q AM    Dialysis Orders: SW MWF  4h  400/800   97kg   3K/2.5Ca bath  TDC  Hep none - last HD 1/10, post wt 98.8 - mircera 150 mcg q2, last 1/08, due 1/22  Assessment/Plan: Abd pain/ stercoral proctitis w/o evidence of perfortaion/ acute diverticulitis - per abd CT. Hx of recent Cdif infection. Po vanc started for suspected Cdif, gen surg consulted.  R BKA Dehiscence - Followed by Dr. Sharol Given: plan for tentative surgery on Friday 1/19 but need to resolve current medical issues first. DM2 - uncont, per pmd ESRD - on HD MWF. Received HD yesterday. Next HD 06/09/21. HTN/ volume - sig LE / hip edema on examm, no resp issues. BP's are soft at admit but now stable. Get a good bed weight.  Continue max UF as tolerated.  Anemia esrd - Hb now 7.9, next ESA due on Jan 22. Transfuse prn.  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 Holding binders for now. Not on vdra. Monitor trend. PAF - no longer on a/c due to hx of GIB H/o PAD - sp R AKA Recent bleeding rectal ulcer - rx'd surgically dec 2023  Tobie Poet, NP [facility] 06/07/2022,11:47 AM  LOS: 2 days

## 2022-06-07 NOTE — Evaluation (Signed)
Occupational Therapy Evaluation Patient Details Name: Susan [name] MRN: [id number] DOB: October 27, 1950 Today's Date: 06/07/2022   History of Present Illness 72 y.o. female who presents dehiscence and ulceration right transtibial amputation.  Patient has been admitted for medical conditions including rectal bleeding and C. difficile.  Patient is currently on dialysis. 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 S/p R BKA, 03/2022 with R heel drainage AKI; DM2, CHF, HTN, CAD, CKD, MI, BLE venous insufficiency, anxiety.ESRD ([facility]) obesity, OA, CVA, Urinary incontinence   Clinical Impression     Pt is a 72 yo female s/p dehiscence and ulceration R transtibial amputation. Pt was residing at [facility] for therapy.  Pt currently, reports that she wants to go to rehab, but eventually wants to go home. Currently, she requires a mechanical lift for transfers in/out of bed at SNF. Pt does not appear to realize the magnitude of her deficits at this time. She reports that her husband continues to work full time and she does not have other friends or family in the area to assist. Pt performing lateral scoot towards HOB; totalA +2 for scooting self up towards [facility] after 4 sucessful scoots. Pt performing 4L O2 for comfort >98% O2.  Pt set-upA to [place] for ADL and supervisionA to [place] for bed mobiltiy. Pt would benefit from continued OT skilled services. OT following acutely.     Recommendations for follow up therapy are one component of a multi-disciplinary discharge planning process, led by the attending physician.  Recommendations may be updated based on patient status, additional functional criteria and insurance authorization.   Follow Up Recommendations  Skilled nursing-short term rehab (<3 hours/day)     Assistance Recommended at Discharge Frequent or constant Supervision/Assistance  Patient can return home with the following Two people to help with walking and/or transfers;Two people to help with  bathing/dressing/bathroom    Functional Status Assessment  Patient has had a recent decline in their functional status and demonstrates the ability to make significant improvements in function in a reasonable and predictable amount of time.  Equipment Recommendations  Hospital bed;Wheelchair (measurements OT);Wheelchair cushion (measurements OT) (can defer to next facility)    Recommendations for Other Services       Precautions / Restrictions Precautions Precautions: Fall;Other (comment) Precaution Comments: recent R BKA and CDIFF Required Braces or Orthoses: Other Brace Other Brace: R BKA limb protector- skin wrapped Restrictions Weight Bearing Restrictions: Yes RLE Weight Bearing: Non weight bearing      Mobility Bed Mobility Overal bed mobility: Needs Assistance Bed Mobility: Rolling, Sidelying to Sit, Sit to Sidelying Rolling: Supervision Sidelying to sit: Min guard Supine to sit: Min assist     General bed mobility comments: use of rails for bed mobility; assist for LLE for getting back into bed.    Transfers Overall transfer level: Needs assistance Equipment used: None              Lateral/Scoot Transfers: Mod assist General transfer comment: lateral scoot towards HOB; totalA +2 for scootuing self up towards [place] after 4 sucessful scoots.      Balance Overall balance assessment: Needs assistance Sitting-balance support: No upper extremity supported, Feet supported Sitting balance-Leahy Scale: Fair                                     ADL either performed or assessed with clinical judgement   ADL Overall ADL's : Needs assistance/impaired Eating/Feeding: Set up;Sitting;Bed level  Grooming: Set up;Sitting   Upper Body Bathing: Set up;Sitting   Lower Body Bathing: Maximal assistance;Sitting/lateral leans;Bed level   Upper Body Dressing : Minimal assistance;Sitting   Lower Body Dressing: Maximal assistance;Sitting/lateral leans;Bed  level   Toilet Transfer: Total assistance Toilet Transfer Details (indicate cue type and reason): use of lift; pt cannot feel her L foot on ground Toileting- Clothing Manipulation and Hygiene: Total assistance;+2 for physical assistance;+2 for safety/equipment       Functional mobility during ADLs: Moderate assistance;+2 for physical assistance;+2 for safety/equipment;Total assistance (rolling to side of bed- supervisionA; sidelying to sitting upright minguardA; did not attempt standing today as SNF was not performing and pt could not feel her L foot on the ground.) General ADL Comments: grooming seated; bed mobility perfomed; simple LE exercises performed.     Vision Baseline Vision/History: 0 No visual deficits Ability to See in Adequate Light: 0 Adequate Patient Visual Report: No change from baseline Vision Assessment?: No apparent visual deficits     Perception     Praxis      Pertinent Vitals/Pain Pain Assessment Pain Assessment: 0-10 Pain Score: 5  Pain Location: RLE Pain Descriptors / Indicators: Discomfort Pain Intervention(s): Monitored during session, Repositioned     Hand Dominance Right   Extremity/Trunk Assessment Upper Extremity Assessment Upper Extremity Assessment: Generalized weakness;RUE deficits/detail;LUE deficits/detail RUE Deficits / Details: edema noted LUE Deficits / Details: edema noted   Lower Extremity Assessment Lower Extremity Assessment: Generalized weakness;RLE deficits/detail RLE Deficits / Details: Rt BKA limited strength and noted edema, skin injuries presumed due to limb protector   Cervical / Trunk Assessment Cervical / Trunk Assessment: Normal   Communication Communication Communication: No difficulties   Cognition Arousal/Alertness: Awake/alert Behavior During Therapy: Anxious Overall Cognitive Status: Impaired/Different from baseline Area of Impairment: Awareness, Problem solving                       Following  Commands: Follows one step commands consistently, Follows multi-step commands with increased time Safety/Judgement: Decreased awareness of deficits Awareness: Emergent Problem Solving: Slow processing, Difficulty sequencing, Requires verbal cues General Comments: Pt reports that she wants to go to rehab, but eventually wants to go home. Currently, she requires a mechanical lift for transfers in/out of bed at SNF. Pt does not appear to realize the magnitude of her deficits at this time. She reports that her husband continues to work full time and she does not have other friends or family in the area to assist.     General Comments  4L O2 for comfort >98% O2    Exercises     Shoulder Instructions      Home Living Family/patient expects to be discharged to:: Skilled nursing facility Living Arrangements: Spouse/significant other Available Help at Discharge: Family;Available PRN/intermittently Type of Home: House Home Access: Ramped entrance     Home Layout: One level     Bathroom Shower/Tub: Tub/shower unit;Door   Bathroom Toilet: Handicapped height     Home Equipment: Grab bars - tub/shower;Rolling Environmental consultant (2 wheels);Rollator (4 wheels);BSC/3in1;Cane - quad;Shower seat   Additional Comments: husband works as Administrator during day, typically home by 6p; shower seat does not fit in her tub      Prior Functioning/Environment Prior Level of Function : Needs assist       Physical Assist : Mobility (physical);ADLs (physical)     Mobility Comments: requires lift to transfer OOB since BKA ADLs Comments: requires assist for ADLs bed level, pt reports self feeding,  grooming        OT Problem List: Decreased strength;Decreased activity tolerance;Impaired balance (sitting and/or standing);Obesity;Decreased safety awareness;Decreased knowledge of use of DME or AE;Decreased knowledge of precautions      OT Treatment/Interventions: Self-care/ADL training;Therapeutic exercise;DME  and/or AE instruction;Therapeutic activities;Patient/family education;Balance training    OT Goals(Current goals can be found in the care plan section) Acute Rehab OT Goals Patient Stated Goal: to go to rehab OT Goal Formulation: With patient Time For Goal Achievement: 06/21/22 Potential to Achieve Goals: Fair ADL Goals Pt Will Transfer to Toilet: with mod assist;with +2 assist;bedside commode Pt/caregiver will Perform Home Exercise Program: Increased strength;Both right and left upper extremity  OT Frequency: Min 2X/week    Co-evaluation              AM-PAC OT "6 Clicks" Daily Activity     Outcome Measure Help from another person eating meals?: None Help from another person taking care of personal grooming?: A Little Help from another person toileting, which includes using toliet, bedpan, or urinal?: Total Help from another person bathing (including washing, rinsing, drying)?: A Lot Help from another person to put on and taking off regular upper body clothing?: A Little Help from another person to put on and taking off regular lower body clothing?: Total 6 Click Score: 14   End of Session Equipment Utilized During Treatment: Oxygen Nurse Communication: Mobility status;Need for lift equipment  Activity Tolerance: Patient tolerated treatment well Patient left: in chair;with call bell/phone within reach  OT Visit Diagnosis: Muscle weakness (generalized) (M62.81);Other abnormalities of gait and mobility (R26.89)                Time: 1012-1040 OT Time Calculation (min): 28 min Charges:  OT General Charges $OT Visit: 1 Visit OT Evaluation $OT Eval Low Complexity: 1 Low OT Treatments $Self Care/Home Management : 8-22 mins  Jefferey Pica, OTR/L Acute Rehabilitation Services Office: [phone]   [id number] A OZHYQ 06/07/2022, 11:44 AM

## 2022-06-07 NOTE — Progress Notes (Signed)
PROGRESS NOTE    Susan [name]  KVQ:[id number] DOB: [birth year] DOA: 06/05/2022 PCP: [name], MD   Brief Narrative: 72 year old with past medical history significant for hypertension, hyperlipidemia, PAF not on anticoagulation, diastolic heart failure, CAD, ESRD on hemodialysis MWF, chronic respiratory failure on oxygen, diabetes type 2, hypothyroidism, GI bleed, history of recurrent C. difficile colitis who presents with complaint of lower abdominal pain that is started the day prior to admission.  She also reported nausea, vomiting and diarrhea.  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 Sharol Given 1/12.  Of note recent admission from 12/28 until 1 8//2024 for acute lower GI bleed.  GI tried to perform flexible sigmoidoscopy x 2 which were unsuccessful.  General surgery was consulted and patient underwent exam under anesthesia with oversewing of rectal ulcer found on 05/24/2022 by Dr. Rich Number.  She had a total of 4 units of packed red blood cell transfusion during that hospitalization and Eliquis was recommended to be discontinued.    Assessment & Plan:   Principal Problem:   Diverticulitis Active Problems:   Stercoral colitis   C. difficile colitis   Spleen hematoma   Type 2 diabetes mellitus with diabetic polyneuropathy, with long-term current use of insulin (HCC)   ESRD on dialysis (HCC)   Paroxysmal atrial fibrillation (HCC)   Dehiscence of amputation stump of right lower extremity (HCC)   Anemia of chronic disease   Hypothyroidism   Prolonged QT interval   History of Clostridioides difficile colitis   Below-knee amputation of right lower extremity ([place])  Severe Constipation:  C. difficile colitis: ruled out  Stercoral colitis, sigmoid diverticulitis CT: Showed large stool ball in the rectum with a scattered rectal wall pneumatosis concerning for a stercoral colitis and noted of sigmoid diverticulosis with acute diverticulitis. General surgery consulted  recommended laxative.  ID doesn't think patient has C diff colitis. Plan to discontinue vancomycin.  ID recommend to hold on staring antibiotics.  General Surgery now recommend antibiotics for CT finding. I have order Augmentin BID for 3 days and prophylaxis oral Vancomycin/  Discussed with General Sx PA, ok to advanced diet.       Perisplenic collection: -CT abdomen pelvis with contrast noted 6 x 4 x 2 cm complex subcapsular collection in the superior aspect of the spleen with concern for hematoma versus infectious process. Monitor hemoglobin Surgery, think possible spontaneous subcapsular hematoma, lower suspicious for infectious collection as there is no gas.  Continue to monitor hemoglobin.  -Will hold aspirin and DVT prophylaxis.  Needs imagine follow up out patient for splenic hematoma. Probably need to repeat CT scan prior to discharge/  Hb 8.3--7.9, slight decreased. Will repeat labs this afternoon.   Chronic Respiratory Failure: on 3 L oxygen at home in setting HF.  Report SOB today.  Repeated chest  x ray: No active diseases.  Will schedule nebulizer treatment.    Diabetes type 2 uncontrolled with hyperglycemia Last A1c 8.9 Continue with same clean 20 units sliding scale insulin Home regimen includes Lantus 10 units nightly, Tradjenta 5 units   ESRD, HD  M,W,F Nephrology consulted.  Had HD 1/12.  Paroxysmal A-fib: Continue amiodarone   Wound dehiscence,  status post right BKA Patient is scheduled to have revision of prior BKA due to wound dehiscence  by Dr. Sharol Given. Dr. Sharol Given has canceled procedure for 1/12. Sx end next week.   Anemia of chronic disease: Hb baseline 8.9 Continue to monitor  Prolonged QT: Avoid prolonged QT medications.  Hypothyroidism: Continue with Synthroid.  History of GI bleed: Patient presented to the hospital last month with GI bleed.  Ultimately underwent exam under anesthesia with oversewing of rectal ulcer found on 05/24/2022 by Dr.  Rich Number.   Hyponatremia; treat with HD   Pressure Injury 05/23/22 Buttocks Right Deep Tissue Pressure Injury - Purple or maroon localized area of discolored intact skin or blood-filled blister due to damage of underlying soft tissue from pressure and/or shear. (Active)  05/23/22 1715  Location: Buttocks  Location Orientation: Right  Staging: Deep Tissue Pressure Injury - Purple or maroon localized area of discolored intact skin or blood-filled blister due to damage of underlying soft tissue from pressure and/or shear.  Wound Description (Comments):   Present on Admission: Yes  Dressing Type Foam - Lift dressing to assess site every shift 05/29/22 0800     Pressure Injury 05/23/22 Right;Left;Posterior Stage 1 -  Intact skin with non-blanchable redness of a localized area usually over a bony prominence. (Active)  05/23/22   Location:   Location Orientation: Right;Left;Posterior  Staging: Stage 1 -  Intact skin with non-blanchable redness of a localized area usually over a bony prominence.  Wound Description (Comments):   Present on Admission: Yes  Dressing Type Foam - Lift dressing to assess site every shift 05/29/22 0800      Estimated body mass index is 32.58 kg/m as calculated from the following:   Height as of 05/26/22: '5\' 5"'$  (1.651 m).   Weight as of this encounter: 88.8 kg.   DVT prophylaxis: SCD Code Status: Full code Family Communication: Disposition Plan:  Status is: Inpatient Remains inpatient appropriate because: management of constipation.      Consultants:  ID Surgery  Procedures:  none  Antimicrobials:    Subjective: No further BM here in the hospital.  Report SOB,. Report hungry. Would like diet to be advanced.    Objective: Vitals:   06/06/22 2103 06/06/22 2146 06/07/22 0536 06/07/22 0936  BP:  (!) 138/47 (!) 124/52 (!) 121/32  Pulse:  78 78 70  Resp:  '20 16 17  '$ Temp:  97.6 F (36.4 C) 97.8 F (36.6 C) 97.6 F (36.4 C)  TempSrc:  Oral  Oral Oral  SpO2:  99% 98% 100%  Weight: 88.8 kg       Intake/Output Summary (Last 24 hours) at 06/07/2022 1018 Last data filed at 06/07/2022 1014 Gross per 24 hour  Intake 660 ml  Output 3500 ml  Net -2840 ml    Filed Weights   06/06/22 1623 06/06/22 2103  Weight: 94.7 kg 88.8 kg    Examination:  General exam: Appears calm and comfortable  Respiratory system: Clear to auscultation. Respiratory effort normal. Cardiovascular system: S1 & S2 heard, RRR. No JVD, murmurs, rubs, gallops or clicks. No pedal edema. Gastrointestinal system: Abdomen is nondistended, soft and nontender. No organomegaly or masses felt. Normal bowel sounds heard. Central nervous system: Alert and oriented. No focal neurological deficits. Extremities: right BKA cover, left LE with edema mild redness  Data Reviewed: I have personally reviewed following labs and imaging studies  CBC: Recent Labs  Lab 06/05/22 0253 06/06/22 0533 06/07/22 0141  WBC 10.4 10.7* 9.6  NEUTROABS 8.6*  --   --   HGB 8.9* 8.3* 7.9*  HCT 29.1* 26.5* 26.3*  MCV 88.2 87.5 88.0  PLT 246 187 379    Basic Metabolic Panel: Recent Labs  Lab 06/05/22 0253 06/06/22 0533 06/07/22 0141 06/07/22 0934  NA 130* 130*  131* 129* 129*  K 3.5 3.7  3.8 4.0 3.6  CL 93* 93*  93* 94* 94*  CO2 '28 26  26 28 27  '$ GLUCOSE 346* 195*  193* 135* 112*  BUN 28* 38*  37* 18 20  CREATININE 2.43* 2.99*  3.00* 2.04* 2.25*  CALCIUM 8.4* 8.1*  8.1* 7.8* 7.9*  MG  --  2.5*  --   --   PHOS  --  2.6 1.7*  --     GFR: Estimated Creatinine Clearance: 24.5 mL/min (A) (by C-G formula based on SCr of 2.25 mg/dL (H)). Liver Function Tests: Recent Labs  Lab 06/05/22 0253 06/06/22 0533 06/07/22 0141  AST 11*  --   --   ALT 11  --   --   ALKPHOS 101  --   --   BILITOT 0.6  --   --   PROT 5.8*  --   --   ALBUMIN 2.0* 1.6* 1.7*    Recent Labs  Lab 06/05/22 0253  LIPASE 28    No results for input(s): "AMMONIA" in the last 168  hours. Coagulation Profile: No results for input(s): "INR", "PROTIME" in the last 168 hours. Cardiac Enzymes: No results for input(s): "CKTOTAL", "CKMB", "CKMBINDEX", "TROPONINI" in the last 168 hours. BNP (last 3 results) No results for input(s): "PROBNP" in the last 8760 hours. HbA1C: No results for input(s): "HGBA1C" in the last 72 hours. CBG: Recent Labs  Lab 06/05/22 2114 06/06/22 0803 06/06/22 1132 06/06/22 2200 06/07/22 0738  GLUCAP 305* 210* 168* 154* 107*    Lipid Profile: No results for input(s): "CHOL", "HDL", "LDLCALC", "TRIG", "CHOLHDL", "LDLDIRECT" in the last 72 hours. Thyroid Function Tests: No results for input(s): "TSH", "T4TOTAL", "FREET4", "T3FREE", "THYROIDAB" in the last 72 hours. Anemia Panel: No results for input(s): "VITAMINB12", "FOLATE", "FERRITIN", "TIBC", "IRON", "RETICCTPCT" in the last 72 hours. Sepsis Labs: No results for input(s): "PROCALCITON", "LATICACIDVEN" in the last 168 hours.  Recent Results (from the past 240 hour(s))  C Difficile Quick Screen w PCR reflex     Status: Abnormal   Collection Time: 06/05/22  9:03 AM   Specimen: STOOL  Result Value Ref Range Status   C Diff antigen POSITIVE (A) NEGATIVE Final   C Diff toxin NEGATIVE NEGATIVE Final   C Diff interpretation Results are indeterminate. See PCR results.  Final    Comment: Performed at [facility], [place] [address] [place], [place] [address]  C. Diff by PCR, Reflexed     Status: Abnormal   Collection Time: 06/05/22  9:03 AM  Result Value Ref Range Status   Toxigenic C. Difficile by PCR POSITIVE (A) NEGATIVE Final    Comment: Positive for toxigenic C. difficile with little to no toxin production. Only treat if clinical presentation suggests symptomatic illness. Performed at [facility], [place] [address] [place], [place] [address]   Blood culture (routine x 2)     Status: None (Preliminary result)   Collection Time: 06/05/22  9:21 AM   Specimen: BLOOD RIGHT ARM   Result Value Ref Range Status   Specimen Description BLOOD RIGHT ARM  Final   Special Requests   Final    BOTTLES DRAWN AEROBIC AND ANAEROBIC Blood Culture results may not be optimal due to an inadequate volume of blood received in culture bottles   Culture   Final    NO GROWTH 2 DAYS Performed at [facility], [place] [address], [place],  [address]    Report Status PENDING  Incomplete  Radiology Studies: No results found.      Scheduled Meds:  amiodarone  200 mg Oral Daily   Chlorhexidine Gluconate Cloth  6 each Topical Q0600   docusate sodium  100 mg Oral BID   Gerhardt's butt cream   Topical TID   insulin aspart  0-15 Units Subcutaneous TID WC   insulin aspart  0-5 Units Subcutaneous QHS   insulin glargine-yfgn  20 Units Subcutaneous QHS   levothyroxine  50 mcg Oral QAC breakfast   midodrine  10 mg Oral Q M,W,F-HD   midodrine  5 mg Oral TID WC   multivitamin  1 tablet Oral Daily   pantoprazole  40 mg Oral Daily   polyethylene glycol  17 g Oral BID   pregabalin  75 mg Oral Daily   saccharomyces boulardii  500 mg Oral BID   sevelamer carbonate  1,600 mg Oral TID WC   sodium chloride flush  3 mL Intravenous Q12H   vortioxetine HBr  20 mg Oral q AM   Continuous Infusions:     LOS: 2 days    Time spent: 35 minutes.    Elmarie Shiley, MD [facility]   If 7PM-7AM, please contact night-coverage [email or web address]  06/07/2022, 10:18 AM

## 2022-06-07 NOTE — Evaluation (Signed)
Physical Therapy Evaluation Patient Details Name: Susan [name] MRN: [id number] DOB: 1951-01-12 Today's Date: 06/07/2022  History of Present Illness  72 y.o. female who presents dehiscence and ulceration right transtibial amputation.  Patient has been admitted for medical conditions including rectal bleeding and C. difficile.  Patient is currently on dialysis. 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 S/p R BKA, 03/2022 with R heel drainage AKI; DM2, CHF, HTN, CAD, CKD, MI, BLE venous insufficiency, anxiety.ESRD ([facility]) obesity, OA, CVA, Urinary incontinence  Clinical Impression  Pt admitted with above diagnosis. Tolerated evaluation and treatment very well. A little anxious with sitting EOB and WB through LLE. Tolerated initiating sit<>stand with light forward lean and buttock lifts, progressing to lateral scoot along bed at mod assist level (bed pad moved with pt to reduce friction on fragile skin.) Pt eager to participate. Claims she has only performed supine bed level exercises for the past 2 weeks at SNF. Encouraged increased frequency and PT participation for ability to stand and transfer again. Some Lt ischial skin breakdown - RN Notified. Pt currently with functional limitations due to the deficits listed below (see PT Problem List). Pt will benefit from skilled PT to increase their independence and safety with mobility to allow discharge to the venue listed below.          Recommendations for follow up therapy are one component of a multi-disciplinary discharge planning process, led by the attending physician.  Recommendations may be updated based on patient status, additional functional criteria and insurance authorization.  Follow Up Recommendations Skilled nursing-short term rehab (<3 hours/day) Can patient physically be transported by private vehicle: No    Assistance Recommended at Discharge Intermittent Supervision/Assistance  Patient can return home with the following  Two people to help  with walking and/or transfers;Direct supervision/assist for medications management;Direct supervision/assist for financial management;Assist for transportation;Help with stairs or ramp for entrance;Two people to help with bathing/dressing/bathroom;Assistance with cooking/housework    Equipment Recommendations  (TBD next venue)  Recommendations for Other Services       Functional Status Assessment Patient has had a recent decline in their functional status and demonstrates the ability to make significant improvements in function in a reasonable and predictable amount of time.     Precautions / Restrictions Precautions Precautions: Fall;Other (comment) Precaution Comments: recent R BKA and CDIFF Required Braces or Orthoses: Other Brace Other Brace: R BKA limb protector- skin wrapped Restrictions Weight Bearing Restrictions: Yes RLE Weight Bearing: Non weight bearing      Mobility  Bed Mobility Overal bed mobility: Needs Assistance Bed Mobility: Rolling, Sidelying to Sit, Sit to Sidelying Rolling: Supervision Sidelying to sit: Min assist Supine to sit: Min assist     General bed mobility comments: Min assist for sidelying/Sit transitions. Rising to EOB from flat surface using rail, min assist for trunk, heavy VC for technique. Min assist for LEs back into bed.    Transfers Overall transfer level: Needs assistance Equipment used: None              Lateral/Scoot Transfers: Mod assist General transfer comment: Practiced forward weight shift and initiated sit<>stand training with light buttocks unloading, WB through LLE. Progressed towards lateral scoot with forward lean lightly lifting buttock and scooting up in bed towards left, Mod assist throughout. Using bed pad to scoot and reduce friction to skin.    Ambulation/Gait                  Stairs  Wheelchair Mobility    Modified Rankin (Stroke Patients Only)       Balance Overall balance  assessment: Needs assistance Sitting-balance support: No upper extremity supported, Feet supported Sitting balance-Leahy Scale: Fair                                       Pertinent Vitals/Pain Pain Assessment Pain Assessment: 0-10 Pain Score: 4  Pain Location: RLE Pain Descriptors / Indicators: Discomfort Pain Intervention(s): Monitored during session, Repositioned    Home Living Family/patient expects to be discharged to:: Skilled nursing facility Living Arrangements: Spouse/significant other Available Help at Discharge: Family;Available PRN/intermittently Type of Home: House Home Access: Ramped entrance       Home Layout: One level Home Equipment: Grab bars - tub/shower;Rolling Walker (2 wheels);Rollator (4 wheels);BSC/3in1;Cane - quad;Shower seat Additional Comments: husband works as Administrator during day, typically home by 6p; shower seat does not fit in her tub    Prior Function Prior Level of Function : Needs assist       Physical Assist : Mobility (physical);ADLs (physical)     Mobility Comments: requires lift to transfer OOB since BKA ADLs Comments: requires assist for ADLs bed level, pt reports self feeding, grooming     Hand Dominance   Dominant Hand: Right    Extremity/Trunk Assessment   Upper Extremity Assessment Upper Extremity Assessment: Defer to OT evaluation RUE Deficits / Details: edema noted LUE Deficits / Details: edema noted    Lower Extremity Assessment Lower Extremity Assessment: Generalized weakness;RLE deficits/detail RLE Deficits / Details: Rt BKA limited strength and noted edema, bandage in place LLE Deficits / Details: Reduced sensation    Cervical / Trunk Assessment Cervical / Trunk Assessment: Normal  Communication   Communication: No difficulties  Cognition Arousal/Alertness: Awake/alert Behavior During Therapy: WFL for tasks assessed/performed Overall Cognitive Status: Impaired/Different from  baseline Area of Impairment: Awareness, Problem solving                           Awareness: Emergent Problem Solving: Slow processing, Difficulty sequencing, Requires verbal cues          General Comments General comments (skin integrity, edema, etc.): 4L O2 for comfort >98% O2    Exercises General Exercises - Lower Extremity Ankle Circles/Pumps: AROM, Left, 15 reps, Supine Quad Sets: Strengthening, AROM, Both, 10 reps, Supine Short Arc Quad: Strengthening, Both, 10 reps, Supine Long Arc Quad: Strengthening, Both, 10 reps, Seated Other Exercises Other Exercises: Rt knee flexion x10 supine Other Exercises: Seated balance training - reaching into limits of stability, forward, lateraly, across midline, and low, min guard for safety.   Assessment/Plan    PT Assessment Patient needs continued PT services  PT Problem List Decreased strength;Decreased range of motion;Decreased activity tolerance;Decreased balance;Decreased mobility;Decreased cognition;Decreased knowledge of use of DME;Decreased safety awareness;Decreased knowledge of precautions;Decreased skin integrity;Pain;Obesity;Cardiopulmonary status limiting activity;Impaired sensation       PT Treatment Interventions DME instruction;Functional mobility training;Therapeutic activities;Therapeutic exercise;Balance training;Neuromuscular re-education;Patient/family education;Wheelchair mobility training    PT Goals (Current goals can be found in the Care Plan section)  Acute Rehab PT Goals Patient Stated Goal: Get well, walk again PT Goal Formulation: With patient Time For Goal Achievement: 06/21/22 Potential to Achieve Goals: Fair    Frequency Min 2X/week     Co-evaluation  AM-PAC PT "6 Clicks" Mobility  Outcome Measure Help needed turning from your back to your side while in a flat bed without using bedrails?: A Little Help needed moving from lying on your back to sitting on the side of a  flat bed without using bedrails?: A Little Help needed moving to and from a bed to a chair (including a wheelchair)?: Total Help needed standing up from a chair using your arms (e.g., wheelchair or bedside chair)?: Total Help needed to walk in hospital room?: Total Help needed climbing 3-5 steps with a railing? : Total 6 Click Score: 10    End of Session Equipment Utilized During Treatment: Oxygen Activity Tolerance: Patient tolerated treatment well Patient left: in bed;with call bell/phone within reach Nurse Communication: Mobility status;Need for lift equipment PT Visit Diagnosis: Muscle weakness (generalized) (M62.81);Pain;History of falling (Z91.81);Difficulty in walking, not elsewhere classified (R26.2) Pain - Right/Left: Right Pain - part of body: Leg    Time: 4388-8757 PT Time Calculation (min) (ACUTE ONLY): 37 min   Charges:   PT Evaluation $PT Eval Moderate Complexity: 1 Mod PT Treatments $Therapeutic Activity: 8-22 mins        Candie Mile, PT, DPT Physical Therapist Acute Rehabilitation Services [place]   Ellouise Newer 06/07/2022, 2:03 PM

## 2022-06-07 NOTE — Progress Notes (Signed)
Subjective/Chief Complaint: Comfortable this morning with only occasional cramping abdominal pain No BM   Objective: Vital signs in last 24 hours: Temp:  [97.6 F (36.4 C)-97.8 F (36.6 C)] 97.8 F (36.6 C) (01/13 0536) Pulse Rate:  [62-78] 78 (01/13 0536) Resp:  [10-20] 16 (01/13 0536) BP: (90-143)/(42-69) 124/52 (01/13 0536) SpO2:  [98 %-100 %] 98 % (01/13 0536) Weight:  [88.8 kg-94.7 kg] 88.8 kg (01/12 2103) Last BM Date : 06/06/22  Intake/Output from previous day: 01/12 0701 - 01/13 0700 In: 360 [P.O.:360] Out: 3500  Intake/Output this shift: Total I/O In: 300 [P.O.:300] Out: -   Exam: Awake and alert Comfortable Abdomen soft, non-tender, no guarding  Lab Results:  Recent Labs    06/06/22 0533 06/07/22 0141  WBC 10.7* 9.6  HGB 8.3* 7.9*  HCT 26.5* 26.3*  PLT 187 201   BMET Recent Labs    06/06/22 0533 06/07/22 0141  NA 130*  131* 129*  K 3.7  3.8 4.0  CL 93*  93* 94*  CO2 '26  26 28  '$ GLUCOSE 195*  193* 135*  BUN 38*  37* 18  CREATININE 2.99*  3.00* 2.04*  CALCIUM 8.1*  8.1* 7.8*   PT/INR No results for input(s): "LABPROT", "INR" in the last 72 hours. ABG No results for input(s): "PHART", "HCO3" in the last 72 hours.  Invalid input(s): "PCO2", "PO2"  Studies/Results: No results found.  Anti-infectives: Anti-infectives (From admission, onward)    Start     Dose/Rate Route Frequency Ordered Stop   07/12/22 1000  vancomycin (VANCOCIN) capsule 125 mg  Status:  Discontinued       See Hyperspace for full Linked Orders Report.   125 mg Oral Every 3 DAYS 06/05/22 2301 06/06/22 1030   07/04/22 1000  vancomycin (VANCOCIN) capsule 125 mg  Status:  Discontinued       See Hyperspace for full Linked Orders Report.   125 mg Oral Every other day 06/05/22 2301 06/06/22 1030   06/27/22 1000  vancomycin (VANCOCIN) capsule 125 mg  Status:  Discontinued       See Hyperspace for full Linked Orders Report.   125 mg Oral Daily 06/05/22 2301  06/06/22 1030   06/19/22 2200  vancomycin (VANCOCIN) capsule 125 mg  Status:  Discontinued       See Hyperspace for full Linked Orders Report.   125 mg Oral 2 times daily 06/05/22 2301 06/06/22 1030   06/06/22 0000  vancomycin (VANCOCIN) capsule 125 mg  Status:  Discontinued       See Hyperspace for full Linked Orders Report.   125 mg Oral 4 times daily 06/05/22 2301 06/06/22 1030   06/05/22 1000  piperacillin-tazobactam (ZOSYN) IVPB 2.25 g  Status:  Discontinued        2.25 g 100 mL/hr over 30 Minutes Intravenous Every 8 hours 06/05/22 0931 06/06/22 1042   06/05/22 0930  metroNIDAZOLE (FLAGYL) IVPB 500 mg  Status:  Discontinued        500 mg 100 mL/hr over 60 Minutes Intravenous Every 12 hours 06/05/22 0925 06/05/22 0929   06/05/22 0915  metroNIDAZOLE (FLAGYL) IVPB 500 mg  Status:  Discontinued        500 mg 100 mL/hr over 60 Minutes Intravenous  Once 06/05/22 0900 06/05/22 1043   06/05/22 0915  piperacillin-tazobactam (ZOSYN) IVPB 3.375 g  Status:  Discontinued        3.375 g 12.5 mL/hr over 240 Minutes Intravenous Every 8 hours 06/05/22 0901 06/05/22 0926  Assessment/Plan: Stercoral proctitis without evidence of perforation  - afebrile, WBC normal - NOTHING PER RECTUM to avoid further irritation and recurrent rectal bleeding.   Hgb down a little.  Suspect this may be a small hematoma and not infectious Agree with Dr. Donne Hazel about continuing antibiotics give rectal findings on CT  Orthopedics may be going to the OR next week and we will consider manually disimpacting her at that surgery if she can not pass any more stool and plain xrays show significant stool in the rectum.  Will see again Monday   Coralie Keens MD 06/07/2022

## 2022-06-07 NOTE — Progress Notes (Signed)
[facility] for Infectious Disease  Date of Admission:  06/05/2022     Abx: 1/11-c piptazo 1/11-c po vanc   1/11 metronidazole                                                          Assessment: 72 yo nursing home resident hx cdiff 01/2022 (toxin/pcr positive and diarrhea illness), pAF recently off anticoagulation (05/12/22), dm2, pvd, esrd (04/02/22 right ij HD cath placement and first stage left UE fistula creation), s/p right bka 12/08 with wound dehiscence, hx bleeding rectal ulcer s/p repair in OR 05/24/22 (oversewing of ulcer), admitted from snf due to several days lower abd pain in setting of constipation that don't improve despite laxative, found to have stercoral colitis on imaging along with incidental splenic hematoma   Patient is without sign of sepsis and is nontoxic appearing. She continues to have contipation and is getting miralax currently.  Ct abd pelv show some pneumatosis and also mild stranding sigmoid diverticula   Clinically this is not c/w cdiff. During 01/2022 she has diarrheal illness and positive toxin/pcr testing   --------- 1/13 assessment Clinically improving (abd pain -- spasm of it every 2 hours, rather than consant pain; no tenderness on exam) more suggestive of constipation related Wbc down Mild lower abd pain Still constipated  Again, previously discussed with surgery team. Her blood cx negative, she has no sign of peritonitis. There is some pneumatosis around the stool ball; she also has recent rectal ulcer that needs oversewing on 12/30 as well. In this setting, especially high risk for actually developing cdiff, I would avoid systemic abx for the stercoral colitis or even mild diverticulitis     The splenic imaging suggestion of hematoma (recent doac) will likely need to be followed up. In the ddx is infection but agree with surgery that without sepsis and appearance wise doesn't appear clinically to be hematoma     Her recent bka  stump has some dehiscence. Dr Sharol Given plans to revise this soon. It doesn't appear to be infected at this time     Do not feel strongly abx indicated   Plan: As reasons above, I do not see a need for antibiotics for her at this time which would be rather prophylactic rather than treatment intention Monitor clinically as she could potentially progress in diverticulitis or stercoral ulcer complication If surgery team strongly wants abx -- I would recommend no more than 3 more days of amoxicillin-clavulonate with bid PO vancomycin prophylaxis for the duration of systemic abx plus 7 more days beyond finishing date of systemic abx Id will sign off Discussed with primary team   I spent more than 35 minute reviewing data/chart, and coordinating care and >50% direct face to face time providing counseling/discussing diagnostics/treatment plan with patient   Principal Problem:   Diverticulitis Active Problems:   Anemia of chronic disease   Hypothyroidism   Prolonged QT interval   Type 2 diabetes mellitus with diabetic polyneuropathy, with long-term current use of insulin (HCC)   Paroxysmal atrial fibrillation (HCC)   ESRD on dialysis ([place])   Stercoral colitis   C. difficile colitis   Spleen hematoma   Dehiscence of amputation stump of right lower extremity ([place])   History of Clostridioides difficile colitis  Below-knee amputation of right lower extremity (HCC)   Allergies  Allergen Reactions   Cephalexin Diarrhea and Other (See Comments)   Codeine Nausea And Vomiting and Other (See Comments)    Scheduled Meds:  amiodarone  200 mg Oral Daily   Chlorhexidine Gluconate Cloth  6 each Topical Q0600   docusate sodium  100 mg Oral BID   Gerhardt's butt cream   Topical TID   insulin aspart  0-15 Units Subcutaneous TID WC   insulin aspart  0-5 Units Subcutaneous QHS   insulin glargine-yfgn  20 Units Subcutaneous QHS   ipratropium-albuterol  3 mL Nebulization Q6H   levothyroxine  50 mcg  Oral QAC breakfast   midodrine  10 mg Oral Q M,W,F-HD   midodrine  5 mg Oral TID WC   multivitamin  1 tablet Oral Daily   pantoprazole  40 mg Oral Daily   polyethylene glycol  17 g Oral BID   pregabalin  75 mg Oral Daily   saccharomyces boulardii  500 mg Oral BID   sevelamer carbonate  1,600 mg Oral TID WC   sodium chloride flush  3 mL Intravenous Q12H   vortioxetine HBr  20 mg Oral q AM   Continuous Infusions: PRN Meds:.acetaminophen **OR** acetaminophen, albuterol, fentaNYL (SUBLIMAZE) injection, hydrALAZINE, oxyCODONE-acetaminophen, trimethobenzamide   SUBJECTIVE: "Starving" No pain No stool No fever, chill No leukocytosis -- had resolved  Pain is lower abd spasm that comes about every 1-2 hours mild-moderate  Review of Systems: ROS All other ROS was negative, except mentioned above     OBJECTIVE: Vitals:   06/06/22 2103 06/06/22 2146 06/07/22 0536 06/07/22 0936  BP:  (!) 138/47 (!) 124/52 (!) 121/32  Pulse:  78 78 70  Resp:  '20 16 17  '$ Temp:  97.6 F (36.4 C) 97.8 F (36.6 C) 97.6 F (36.4 C)  TempSrc:  Oral Oral Oral  SpO2:  99% 98% 100%  Weight: 88.8 kg      Body mass index is 32.58 kg/m.  Physical Exam  General/constitutional: no distress, pleasant HEENT: Normocephalic, PER, Conj Clear, EOMI, Oropharynx clear Neck supple CV: rrr no mrg Lungs: clear to auscultation, normal respiratory effort Abd: Soft, Nontender Ext: trace to 1+ edema LLE Skin: stable blister and hyperkeratosis change LLE Neuro: nonfocal MSK: right bka stump dressing c/d  Central line presence: right chest permacath site no purulence/erythema  Lab Results Lab Results  Component Value Date   WBC 9.6 06/07/2022   HGB 7.9 (L) 06/07/2022   HCT 26.3 (L) 06/07/2022   MCV 88.0 06/07/2022   PLT 201 06/07/2022    Lab Results  Component Value Date   CREATININE 2.25 (H) 06/07/2022   BUN 20 06/07/2022   NA 129 (L) 06/07/2022   K 3.6 06/07/2022   CL 94 (L) 06/07/2022   CO2 27  06/07/2022    Lab Results  Component Value Date   ALT 11 06/05/2022   AST 11 (L) 06/05/2022   GGT 144 (H) 02/01/2022   ALKPHOS 101 06/05/2022   BILITOT 0.6 06/05/2022      Microbiology: Recent Results (from the past 240 hour(s))  C Difficile Quick Screen w PCR reflex     Status: Abnormal   Collection Time: 06/05/22  9:03 AM   Specimen: STOOL  Result Value Ref Range Status   C Diff antigen POSITIVE (A) NEGATIVE Final   C Diff toxin NEGATIVE NEGATIVE Final   C Diff interpretation Results are indeterminate. See PCR results.  Final    Comment:  Performed at [facility], [place] [address], [place], [place] [address]  C. Diff by PCR, Reflexed     Status: Abnormal   Collection Time: 06/05/22  9:03 AM  Result Value Ref Range Status   Toxigenic C. Difficile by PCR POSITIVE (A) NEGATIVE Final    Comment: Positive for toxigenic C. difficile with little to no toxin production. Only treat if clinical presentation suggests symptomatic illness. Performed at [facility], [place] [address] [place], [place] [address]   Blood culture (routine x 2)     Status: None (Preliminary result)   Collection Time: 06/05/22  9:21 AM   Specimen: BLOOD RIGHT ARM  Result Value Ref Range Status   Specimen Description BLOOD RIGHT ARM  Final   Special Requests   Final    BOTTLES DRAWN AEROBIC AND ANAEROBIC Blood Culture results may not be optimal due to an inadequate volume of blood received in culture bottles   Culture   Final    NO GROWTH 2 DAYS Performed at [facility], [place] [address], [place], [place] [address]    Report Status PENDING  Incomplete     Serology:   Imaging: If present, new imagings (plain films, ct scans, and mri) have been personally visualized and interpreted; radiology reports have been reviewed. Decision making incorporated into the Impression / Recommendations.   Jabier Mutton, [facility] for Infectious [place] [phone] pager     06/07/2022, 11:13 AM

## 2022-06-08 DIAGNOSIS — K5792 Diverticulitis of intestine, part unspecified, without perforation or abscess without bleeding: Secondary | ICD-10-CM | POA: Diagnosis not present

## 2022-06-08 LAB — RENAL FUNCTION PANEL
Albumin: 1.8 g/dL — ABNORMAL LOW (ref 3.5–5.0)
Anion gap: 8 (ref 5–15)
BUN: 30 mg/dL — ABNORMAL HIGH (ref 8–23)
CO2: 25 mmol/L (ref 22–32)
Calcium: 8 mg/dL — ABNORMAL LOW (ref 8.9–10.3)
Chloride: 96 mmol/L — ABNORMAL LOW (ref 98–111)
Creatinine, Ser: 3.33 mg/dL — ABNORMAL HIGH (ref 0.44–1.00)
GFR, Estimated: 14 mL/min — ABNORMAL LOW (ref >60)
Glucose, Bld: 366 mg/dL — ABNORMAL HIGH (ref 70–99)
Phosphorus: 3.4 mg/dL (ref 2.5–4.6)
Potassium: 4.1 mmol/L (ref 3.5–5.1)
Sodium: 129 mmol/L — ABNORMAL LOW (ref 135–145)

## 2022-06-08 LAB — CBC
HCT: 24.8 % — ABNORMAL LOW (ref 36.0–46.0)
Hemoglobin: 7.7 g/dL — ABNORMAL LOW (ref 12.0–15.0)
MCH: 27.1 pg (ref 26.0–34.0)
MCHC: 31 g/dL (ref 30.0–36.0)
MCV: 87.3 fL (ref 80.0–100.0)
Platelets: 186 10*3/uL (ref 150–400)
RBC: 2.84 MIL/uL — ABNORMAL LOW (ref 3.87–5.11)
RDW: 19.1 % — ABNORMAL HIGH (ref 11.5–15.5)
WBC: 7.5 10*3/uL (ref 4.0–10.5)
nRBC: 0 % (ref 0.0–0.2)

## 2022-06-08 LAB — GLUCOSE, CAPILLARY
Glucose-Capillary: 366 mg/dL — ABNORMAL HIGH (ref 70–99)
Glucose-Capillary: 284 mg/dL — ABNORMAL HIGH (ref 70–99)
Glucose-Capillary: 293 mg/dL — ABNORMAL HIGH (ref 70–99)
Glucose-Capillary: 329 mg/dL — ABNORMAL HIGH (ref 70–99)

## 2022-06-08 LAB — HEPATITIS B SURFACE ANTIGEN: Hepatitis B Surface Ag: NONREACTIVE

## 2022-06-08 MED ORDER — BISACODYL 5 MG PO TBEC
5.0000 mg | DELAYED_RELEASE_TABLET | Freq: Once | ORAL | Status: AC
  Administered 2022-06-08: 5 mg via ORAL
  Filled 2022-06-08: qty 1

## 2022-06-08 MED ORDER — PENTAFLUOROPROP-TETRAFLUOROETH EX AERO: 1.0000 | INHALATION_SPRAY | CUTANEOUS | Status: DC | PRN

## 2022-06-08 MED ORDER — LIDOCAINE-PRILOCAINE 2.5-2.5 % EX CREA: 1.0000 | TOPICAL_CREAM | CUTANEOUS | Status: DC | PRN

## 2022-06-08 MED ORDER — IPRATROPIUM-ALBUTEROL 0.5-2.5 (3) MG/3ML IN SOLN
3.0000 mL | Freq: Three times a day (TID) | RESPIRATORY_TRACT | Status: DC
  Administered 2022-06-08: 3 mL via RESPIRATORY_TRACT
  Filled 2022-06-08 (×2): qty 3

## 2022-06-08 MED ORDER — INSULIN GLARGINE-YFGN 100 UNIT/ML ~~LOC~~ SOLN
23.0000 [IU] | Freq: Every day | SUBCUTANEOUS | Status: DC
  Administered 2022-06-08 – 2022-06-10 (×3): 23 [IU] via SUBCUTANEOUS
  Filled 2022-06-08 (×4): qty 0.23

## 2022-06-08 MED ORDER — LIDOCAINE HCL (PF) 1 % IJ SOLN: 5.0000 mL | INTRAMUSCULAR | Status: DC | PRN

## 2022-06-08 MED ORDER — HEPARIN SODIUM (PORCINE) 1000 UNIT/ML DIALYSIS
1000.0000 [IU] | INTRAMUSCULAR | Status: DC | PRN
  Administered 2022-06-09: 1000 [IU] via INTRAVENOUS_CENTRAL
  Filled 2022-06-08 (×2): qty 1

## 2022-06-08 MED ORDER — ALTEPLASE 2 MG IJ SOLR: 2.0000 mg | Freq: Once | INTRAMUSCULAR | Status: DC | PRN

## 2022-06-08 NOTE — Progress Notes (Signed)
[facility] Progress Note   Subjective:    Seen and examined patient at bedside. No acute complaints. Next HD 06/09/22.  Objective Vitals:   06/07/22 2017 06/07/22 2224 06/08/22 0630 06/08/22 0936  BP: (!) 108/46  134/66 131/65  Pulse: 83  86 83  Resp: 18  19   Temp: 98.2 F (36.8 C)  98 F (36.7 C) 98.1 F (36.7 C)  TempSrc: Oral  Oral   SpO2: 100% 99% 100% 98%  Weight:   96.4 kg    Physical Exam General: Awake, alert, NAD, on 4L O2 [place] [facility]: S1 and S2; No murmurs, gallops, or rubs Lungs: Diminished at bases, clear in uppers bilaterally  Abdomen: Tender to palpation low-mid ABD and LUQ Extremities:R BKA; 1+ edema LLE with redness/scabs Dialysis Access: [facility]   Filed Weights   06/06/22 1623 06/06/22 2103 06/08/22 0630  Weight: 94.7 kg 88.8 kg 96.4 kg    Intake/Output Summary (Last 24 hours) at 06/08/2022 1249 Last data filed at 06/08/2022 0800 Gross per 24 hour  Intake 720 ml  Output 0 ml  Net 720 ml    Additional Objective Labs: Basic Metabolic Panel: Recent Labs  Lab 06/06/22 0533 06/07/22 0141 06/07/22 0934 06/08/22 0742  NA 130*  131* 129* 129* 129*  K 3.7  3.8 4.0 3.6 4.1  CL 93*  93* 94* 94* 96*  CO2 '26  26 28 27 25  '$ GLUCOSE 195*  193* 135* 112* 366*  BUN 38*  37* 18 20 30*  CREATININE 2.99*  3.00* 2.04* 2.25* 3.33*  CALCIUM 8.1*  8.1* 7.8* 7.9* 8.0*  PHOS 2.6 1.7*  --  3.4   Liver Function Tests: Recent Labs  Lab 06/05/22 0253 06/06/22 0533 06/07/22 0141 06/08/22 0742  AST 11*  --   --   --   ALT 11  --   --   --   ALKPHOS 101  --   --   --   BILITOT 0.6  --   --   --   PROT 5.8*  --   --   --   ALBUMIN 2.0* 1.6* 1.7* 1.8*   Recent Labs  Lab 06/05/22 0253  LIPASE 28   CBC: Recent Labs  Lab 06/05/22 0253 06/06/22 0533 06/07/22 0141 06/07/22 1426 06/08/22 0742  WBC 10.4 10.7* 9.6  --  7.5  NEUTROABS 8.6*  --   --   --   --   HGB 8.9* 8.3* 7.9* 8.7* 7.7*  HCT 29.1* 26.5* 26.3* 27.9* 24.8*  MCV 88.2  87.5 88.0  --  87.3  PLT 246 187 201  --  186   Blood Culture    Component Value Date/Time   SDES BLOOD RIGHT ARM 06/05/2022 0921   SPECREQUEST  06/05/2022 0921    BOTTLES DRAWN AEROBIC AND ANAEROBIC Blood Culture results may not be optimal due to an inadequate volume of blood received in culture bottles   CULT  06/05/2022 0921    NO GROWTH 3 DAYS Performed at [facility], [address], [place], [place] [address]    REPTSTATUS PENDING 06/05/2022 5631    Cardiac Enzymes: No results for input(s): "CKTOTAL", "CKMB", "CKMBINDEX", "TROPONINI" in the last 168 hours. CBG: Recent Labs  Lab 06/07/22 1127 06/07/22 1700 06/07/22 2019 06/08/22 0736 06/08/22 1116  GLUCAP 107* 262* 259* 366* 284*   Iron Studies: No results for input(s): "IRON", "TIBC", "TRANSFERRIN", "FERRITIN" in the last 72 hours. Lab Results  Component Value Date  INR 1.4 (H) 05/22/2022   INR 1.5 (H) 02/01/2022   INR 1.3 (H) 01/14/2022   Studies/Results: DG CHEST PORT 1 VIEW  Result Date: 06/07/2022 CLINICAL DATA:  Shortness of breath EXAM: PORTABLE CHEST 1 VIEW COMPARISON:  Chest x-ray May 26, 2022 FINDINGS: Cardiomediastinal silhouette is stable. No pneumothorax. No nodules or masses. No focal infiltrates. Stable right central line terminating in the SVC. No overt edema. IMPRESSION: No active disease. Electronically Signed   By: Dorise Bullion III M.D.   On: 06/07/2022 10:49    Medications:   amiodarone  200 mg Oral Daily   amoxicillin-clavulanate  1 tablet Oral Q12H   Chlorhexidine Gluconate Cloth  6 each Topical Q0600   docusate sodium  100 mg Oral BID   Gerhardt's butt cream   Topical TID   guaiFENesin  600 mg Oral BID   insulin aspart  0-15 Units Subcutaneous TID WC   insulin aspart  0-5 Units Subcutaneous QHS   insulin glargine-yfgn  23 Units Subcutaneous QHS   ipratropium-albuterol  3 mL Nebulization Q6H   levothyroxine  50 mcg Oral QAC breakfast   midodrine  10 mg Oral Q M,W,F-HD    midodrine  5 mg Oral TID WC   multivitamin  1 tablet Oral Daily   pantoprazole  40 mg Oral Daily   polyethylene glycol  17 g Oral BID   pregabalin  75 mg Oral Daily   saccharomyces boulardii  500 mg Oral BID   senna  1 tablet Oral Daily   sodium chloride flush  3 mL Intravenous Q12H   vancomycin  125 mg Oral QID   vortioxetine HBr  20 mg Oral q AM    Dialysis Orders: SW MWF  4h  400/800   97kg   3K/2.5Ca bath  TDC  Hep none - last HD 1/10, post wt 98.8 - mircera 150 mcg q2, last 1/08, due 1/22  Assessment/Plan: Abd pain/ stercoral proctitis w/o evidence of perfortaion/ acute diverticulitis - per abd CT. Hx of recent Cdif infection. Po vanc started for suspected Cdif, gen surg and ID consulted.  R BKA Dehiscence - Followed by Dr. Sharol Given: plan for tentative surgery on Friday 1/19 but need to resolve current medical issues first. DM2 - uncont, per pmd ESRD - on HD MWF. Received HD yesterday. Next HD 06/09/21. HTN/ volume - sig LE / hip edema on examm, no resp issues. BP's are soft at admit but now stable. Get a good bed weight.  Continue max UF as tolerated.  Anemia esrd - Hb now 7.7, next ESA due on Jan 22. Transfuse prn.  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 Holding binders for now. Not on vdra. Monitor trend. PAF - no longer on a/c due to hx of GIB H/o PAD - sp R AKA Recent bleeding rectal ulcer - rx'd surgically dec 2023  Tobie Poet, NP [facility] 06/08/2022,12:49 PM  LOS: 3 days

## 2022-06-08 NOTE — Progress Notes (Signed)
PROGRESS NOTE    Susan [name]  NLZ:[id number] DOB: 09-24-1950 DOA: 06/05/2022 PCP: [name], MD   Brief Narrative: 72 year old with past medical history significant for hypertension, hyperlipidemia, PAF not on anticoagulation, diastolic heart failure, CAD, ESRD on hemodialysis MWF, chronic respiratory failure on oxygen, diabetes type 2, hypothyroidism, GI bleed, history of recurrent C. difficile colitis who presents with complaint of lower abdominal pain that is started the day prior to admission.  She also reported nausea, vomiting and diarrhea.  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 Sharol Given 1/12.  Of note recent admission from 12/28 until 1 8//2024 for acute lower GI bleed.  GI tried to perform flexible sigmoidoscopy x 2 which were unsuccessful.  General surgery was consulted and patient underwent exam under anesthesia with oversewing of rectal ulcer found on 05/24/2022 by Dr. Rich Number.  She had a total of 4 units of packed red blood cell transfusion during that hospitalization and Eliquis was recommended to be discontinued.    Assessment & Plan:   Principal Problem:   Diverticulitis Active Problems:   Stercoral colitis   C. difficile colitis   Spleen hematoma   Type 2 diabetes mellitus with diabetic polyneuropathy, with long-term current use of insulin (HCC)   ESRD on dialysis (HCC)   Paroxysmal atrial fibrillation (HCC)   Dehiscence of amputation stump of right lower extremity (HCC)   Anemia of chronic disease   Hypothyroidism   Prolonged QT interval   History of Clostridioides difficile colitis   Below-knee amputation of right lower extremity ([place])   Constipation  Severe Constipation:  C. difficile colitis: Ruled out  Stercoral colitis, sigmoid diverticulitis CT: Showed large stool ball in the rectum with a scattered rectal wall pneumatosis concerning for a stercoral colitis and noted of sigmoid diverticulosis with acute diverticulitis. General  surgery consulted recommended laxative.  ID doesn't think patient has C diff colitis.  ID recommend to hold on staring antibiotics.  General Surgery now recommend antibiotics for CT finding. I have order Augmentin BID for 3 days and prophylaxis oral Vancomycin/ antibiotics started 1/13 Discussed with General Sx PA, ok to advanced diet.    Perisplenic collection: -CT abdomen pelvis with contrast noted 6 x 4 x 2 cm complex subcapsular collection in the superior aspect of the spleen with concern for hematoma versus infectious process. Monitor hemoglobin Surgery, think possible spontaneous subcapsular hematoma, lower suspicious for infectious collection as there is no gas.  Continue to monitor hemoglobin.  -Will hold aspirin and DVT prophylaxis.  Needs imagine follow up out patient for splenic hematoma. Probably need to repeat CT scan prior to discharge/  Hb 8.3--7.9--7.7 monitor.   Chronic Respiratory Failure: on 3 L oxygen at home in setting HF.  Report SOB today.  Repeated chest  x ray: No active diseases.  Schedule nebulizer treatment.  Breathing better today.   Diabetes type 2 uncontrolled with hyperglycemia Last A1c 8.9 Increase Semglee 23 units sliding scale insulin Home regimen includes Lantus 10 units nightly, Tradjenta 5 units   ESRD, HD  M,W,F Nephrology consulted.  Had HD 1/12.  Paroxysmal A-fib: Continue amiodarone   Wound dehiscence,  status post right BKA Patient is scheduled to have revision of prior BKA due to wound dehiscence  by Dr. Sharol Given. Dr. Sharol Given has canceled procedure for 1/12. Sx end next week.   Anemia of chronic disease: Hb baseline 8.9 Continue to monitor  Prolonged QT: Avoid prolonged QT medications.  Hypothyroidism: Continue with Synthroid.  History of  GI bleed: Patient presented to the hospital last month with GI bleed.  Ultimately underwent exam under anesthesia with oversewing of rectal ulcer found on 05/24/2022 by Dr. Rich Number.    Hyponatremia; Treat with HD   Pressure Injury 05/23/22 Buttocks Right Deep Tissue Pressure Injury - Purple or maroon localized area of discolored intact skin or blood-filled blister due to damage of underlying soft tissue from pressure and/or shear. (Active)  05/23/22 1715  Location: Buttocks  Location Orientation: Right  Staging: Deep Tissue Pressure Injury - Purple or maroon localized area of discolored intact skin or blood-filled blister due to damage of underlying soft tissue from pressure and/or shear.  Wound Description (Comments):   Present on Admission: Yes  Dressing Type Foam - Lift dressing to assess site every shift 05/29/22 0800     Pressure Injury 05/23/22 Right;Left;Posterior Stage 1 -  Intact skin with non-blanchable redness of a localized area usually over a bony prominence. (Active)  05/23/22   Location:   Location Orientation: Right;Left;Posterior  Staging: Stage 1 -  Intact skin with non-blanchable redness of a localized area usually over a bony prominence.  Wound Description (Comments):   Present on Admission: Yes  Dressing Type Foam - Lift dressing to assess site every shift 05/29/22 0800      Estimated body mass index is 35.37 kg/m as calculated from the following:   Height as of 05/26/22: '5\' 5"'$  (1.651 m).   Weight as of this encounter: 96.4 kg.   DVT prophylaxis: SCD Code Status: Full code Family Communication: Husband at bedside 1/13 Disposition Plan:  Status is: Inpatient Remains inpatient appropriate because: management of constipation.      Consultants:  ID Surgery  Procedures:  none  Antimicrobials:    Subjective: She is breathing better, abdominal pain on and off but better. No BM, passing gas.    Objective: Vitals:   06/07/22 2017 06/07/22 2224 06/08/22 0630 06/08/22 0936  BP: (!) 108/46  134/66 131/65  Pulse: 83  86 83  Resp: 18  19   Temp: 98.2 F (36.8 C)  98 F (36.7 C) 98.1 F (36.7 C)  TempSrc: Oral  Oral   SpO2:  100% 99% 100% 98%  Weight:   96.4 kg     Intake/Output Summary (Last 24 hours) at 06/08/2022 1315 Last data filed at 06/08/2022 0800 Gross per 24 hour  Intake 720 ml  Output 0 ml  Net 720 ml    Filed Weights   06/06/22 1623 06/06/22 2103 06/08/22 0630  Weight: 94.7 kg 88.8 kg 96.4 kg    Examination:  General exam: NAD Respiratory system: CTA Cardiovascular system: S 1, S 2 RRR Gastrointestinal system: BS present, soft, nt Central nervous system: non focal.  Extremities: right BKA cover, left LE with edema mild redness  Data Reviewed: I have personally reviewed following labs and imaging studies  CBC: Recent Labs  Lab 06/05/22 0253 06/06/22 0533 06/07/22 0141 06/07/22 1426 06/08/22 0742  WBC 10.4 10.7* 9.6  --  7.5  NEUTROABS 8.6*  --   --   --   --   HGB 8.9* 8.3* 7.9* 8.7* 7.7*  HCT 29.1* 26.5* 26.3* 27.9* 24.8*  MCV 88.2 87.5 88.0  --  87.3  PLT 246 187 201  --  803    Basic Metabolic Panel: Recent Labs  Lab 06/05/22 0253 06/06/22 0533 06/07/22 0141 06/07/22 0934 06/08/22 0742  NA 130* 130*  131* 129* 129* 129*  K 3.5 3.7  3.8 4.0 3.6 4.1  CL 93* 93*  93* 94* 94* 96*  CO2 '28 26  26 28 27 25  '$ GLUCOSE 346* 195*  193* 135* 112* 366*  BUN 28* 38*  37* 18 20 30*  CREATININE 2.43* 2.99*  3.00* 2.04* 2.25* 3.33*  CALCIUM 8.4* 8.1*  8.1* 7.8* 7.9* 8.0*  MG  --  2.5*  --   --   --   PHOS  --  2.6 1.7*  --  3.4    GFR: Estimated Creatinine Clearance: 17.3 mL/min (A) (by C-G formula based on SCr of 3.33 mg/dL (H)). Liver Function Tests: Recent Labs  Lab 06/05/22 0253 06/06/22 0533 06/07/22 0141 06/08/22 0742  AST 11*  --   --   --   ALT 11  --   --   --   ALKPHOS 101  --   --   --   BILITOT 0.6  --   --   --   PROT 5.8*  --   --   --   ALBUMIN 2.0* 1.6* 1.7* 1.8*    Recent Labs  Lab 06/05/22 0253  LIPASE 28    No results for input(s): "AMMONIA" in the last 168 hours. Coagulation Profile: No results for input(s): "INR", "PROTIME" in  the last 168 hours. Cardiac Enzymes: No results for input(s): "CKTOTAL", "CKMB", "CKMBINDEX", "TROPONINI" in the last 168 hours. BNP (last 3 results) No results for input(s): "PROBNP" in the last 8760 hours. HbA1C: No results for input(s): "HGBA1C" in the last 72 hours. CBG: Recent Labs  Lab 06/07/22 1127 06/07/22 1700 06/07/22 2019 06/08/22 0736 06/08/22 1116  GLUCAP 107* 262* 259* 366* 284*    Lipid Profile: No results for input(s): "CHOL", "HDL", "LDLCALC", "TRIG", "CHOLHDL", "LDLDIRECT" in the last 72 hours. Thyroid Function Tests: No results for input(s): "TSH", "T4TOTAL", "FREET4", "T3FREE", "THYROIDAB" in the last 72 hours. Anemia Panel: No results for input(s): "VITAMINB12", "FOLATE", "FERRITIN", "TIBC", "IRON", "RETICCTPCT" in the last 72 hours. Sepsis Labs: No results for input(s): "PROCALCITON", "LATICACIDVEN" in the last 168 hours.  Recent Results (from the past 240 hour(s))  C Difficile Quick Screen w PCR reflex     Status: Abnormal   Collection Time: 06/05/22  9:03 AM   Specimen: STOOL  Result Value Ref Range Status   C Diff antigen POSITIVE (A) NEGATIVE Final   C Diff toxin NEGATIVE NEGATIVE Final   C Diff interpretation Results are indeterminate. See PCR results.  Final    Comment: Performed at [facility], [place] [address], [place], [place] [address]  C. Diff by PCR, Reflexed     Status: Abnormal   Collection Time: 06/05/22  9:03 AM  Result Value Ref Range Status   Toxigenic C. Difficile by PCR POSITIVE (A) NEGATIVE Final    Comment: Positive for toxigenic C. difficile with little to no toxin production. Only treat if clinical presentation suggests symptomatic illness. Performed at [facility], [place] [address] [place], [place] [address]   Blood culture (routine x 2)     Status: None (Preliminary result)   Collection Time: 06/05/22  9:21 AM   Specimen: BLOOD RIGHT ARM  Result Value Ref Range Status   Specimen Description BLOOD RIGHT ARM   Final   Special Requests   Final    BOTTLES DRAWN AEROBIC AND ANAEROBIC Blood Culture results may not be optimal due to an inadequate volume of blood received in culture bottles   Culture   Final    NO GROWTH 3 DAYS Performed  at [facility], [place] [address] [place], [place] [address]    Report Status PENDING  Incomplete         Radiology Studies: DG CHEST PORT 1 VIEW  Result Date: 06/07/2022 CLINICAL DATA:  Shortness of breath EXAM: PORTABLE CHEST 1 VIEW COMPARISON:  Chest x-ray May 26, 2022 FINDINGS: Cardiomediastinal silhouette is stable. No pneumothorax. No nodules or masses. No focal infiltrates. Stable right central line terminating in the SVC. No overt edema. IMPRESSION: No active disease. Electronically Signed   By: Dorise Bullion III M.D.   On: 06/07/2022 10:49        Scheduled Meds:  amiodarone  200 mg Oral Daily   amoxicillin-clavulanate  1 tablet Oral Q12H   Chlorhexidine Gluconate Cloth  6 each Topical Q0600   docusate sodium  100 mg Oral BID   Gerhardt's butt cream   Topical TID   guaiFENesin  600 mg Oral BID   insulin aspart  0-15 Units Subcutaneous TID WC   insulin aspart  0-5 Units Subcutaneous QHS   insulin glargine-yfgn  23 Units Subcutaneous QHS   ipratropium-albuterol  3 mL Nebulization Q6H   levothyroxine  50 mcg Oral QAC breakfast   midodrine  10 mg Oral Q M,W,F-HD   midodrine  5 mg Oral TID WC   multivitamin  1 tablet Oral Daily   pantoprazole  40 mg Oral Daily   polyethylene glycol  17 g Oral BID   pregabalin  75 mg Oral Daily   saccharomyces boulardii  500 mg Oral BID   senna  1 tablet Oral Daily   sodium chloride flush  3 mL Intravenous Q12H   vancomycin  125 mg Oral QID   vortioxetine HBr  20 mg Oral q AM   Continuous Infusions:     LOS: 3 days    Time spent: 35 minutes.    Elmarie Shiley, MD [facility]   If 7PM-7AM, please contact night-coverage [email or web address]  06/08/2022, 1:15 PM

## 2022-06-09 ENCOUNTER — Inpatient Hospital Stay (HOSPITAL_COMMUNITY): Payer: PPO

## 2022-06-09 DIAGNOSIS — K5792 Diverticulitis of intestine, part unspecified, without perforation or abscess without bleeding: Secondary | ICD-10-CM | POA: Diagnosis not present

## 2022-06-09 LAB — BASIC METABOLIC PANEL
Anion gap: 11 (ref 5–15)
BUN: 37 mg/dL — ABNORMAL HIGH (ref 8–23)
CO2: 23 mmol/L (ref 22–32)
Calcium: 8.4 mg/dL — ABNORMAL LOW (ref 8.9–10.3)
Chloride: 94 mmol/L — ABNORMAL LOW (ref 98–111)
Creatinine, Ser: 3.88 mg/dL — ABNORMAL HIGH (ref 0.44–1.00)
GFR, Estimated: 12 mL/min — ABNORMAL LOW (ref >60)
Glucose, Bld: 328 mg/dL — ABNORMAL HIGH (ref 70–99)
Potassium: 4.2 mmol/L (ref 3.5–5.1)
Sodium: 128 mmol/L — ABNORMAL LOW (ref 135–145)

## 2022-06-09 LAB — CBC
HCT: 27.2 % — ABNORMAL LOW (ref 36.0–46.0)
Hemoglobin: 8 g/dL — ABNORMAL LOW (ref 12.0–15.0)
MCH: 26.2 pg (ref 26.0–34.0)
MCHC: 29.4 g/dL — ABNORMAL LOW (ref 30.0–36.0)
MCV: 89.2 fL (ref 80.0–100.0)
Platelets: 203 10*3/uL (ref 150–400)
RBC: 3.05 MIL/uL — ABNORMAL LOW (ref 3.87–5.11)
RDW: 18.8 % — ABNORMAL HIGH (ref 11.5–15.5)
WBC: 7.4 10*3/uL (ref 4.0–10.5)
nRBC: 0 % (ref 0.0–0.2)

## 2022-06-09 LAB — GLUCOSE, CAPILLARY
Glucose-Capillary: 273 mg/dL — ABNORMAL HIGH (ref 70–99)
Glucose-Capillary: 228 mg/dL — ABNORMAL HIGH (ref 70–99)
Glucose-Capillary: 230 mg/dL — ABNORMAL HIGH (ref 70–99)
Glucose-Capillary: 235 mg/dL — ABNORMAL HIGH (ref 70–99)

## 2022-06-09 MED ORDER — GUAIFENESIN 100 MG/5ML PO LIQD
5.0000 mL | ORAL | Status: DC | PRN
  Administered 2022-06-10 – 2022-06-14 (×11): 5 mL via ORAL
  Filled 2022-06-09 (×11): qty 15

## 2022-06-09 MED ORDER — PROSOURCE PLUS PO LIQD
30.0000 mL | Freq: Two times a day (BID) | ORAL | Status: DC
  Administered 2022-06-09 – 2022-06-17 (×11): 30 mL via ORAL
  Filled 2022-06-09 (×11): qty 30

## 2022-06-09 MED ORDER — INSULIN ASPART 100 UNIT/ML IJ SOLN
3.0000 [IU] | Freq: Three times a day (TID) | INTRAMUSCULAR | Status: DC
  Administered 2022-06-09 – 2022-06-10 (×5): 3 [IU] via SUBCUTANEOUS

## 2022-06-09 MED ORDER — FENTANYL CITRATE PF 50 MCG/ML IJ SOSY: 25.0000 ug | PREFILLED_SYRINGE | INTRAMUSCULAR | Status: DC | PRN

## 2022-06-09 MED ORDER — MAGNESIUM CITRATE PO SOLN
1.0000 | Freq: Once | ORAL | Status: AC
  Administered 2022-06-09: 1 via ORAL
  Filled 2022-06-09: qty 296

## 2022-06-09 MED ORDER — BISACODYL 5 MG PO TBEC
5.0000 mg | DELAYED_RELEASE_TABLET | Freq: Once | ORAL | Status: AC
  Administered 2022-06-09: 5 mg via ORAL
  Filled 2022-06-09: qty 1

## 2022-06-09 NOTE — Progress Notes (Signed)
[facility] Progress Note   Subjective:    Seen on dialysis.  Feeling better, some mild abd pain.  Overall improved but not back to baseline she says.  Objective Vitals:   06/09/22 0748 06/09/22 0800 06/09/22 0830 06/09/22 0900  BP: (!) 119/48 121/64 (!) 93/47 (!) 116/47  Pulse: 82 80 77 75  Resp: '10 11 10 10  '$ Temp:      TempSrc:      SpO2: 100% 100% 100% 99%  Weight:       Physical Exam General: Awake, alert, NAD Heart: S1 and S2; No murmurs, gallops, or rubs Lungs: clear anteriorly Abdomen: Tender lower abd quadrants Extremities:R BKA; 1+ edema LLE with excorations and rubor Dialysis Access: [facility]   Filed Weights   06/08/22 0630 06/09/22 0546 06/09/22 0734  Weight: 96.4 kg 98.2 kg 93.1 kg    Intake/Output Summary (Last 24 hours) at 06/09/2022 0914 Last data filed at 06/09/2022 0700 Gross per 24 hour  Intake 1140 ml  Output 0 ml  Net 1140 ml    Additional Objective Labs: Basic Metabolic Panel: Recent Labs  Lab 06/06/22 0533 06/07/22 0141 06/07/22 0934 06/08/22 0742 06/09/22 0332  NA 130*  131* 129* 129* 129* 128*  K 3.7  3.8 4.0 3.6 4.1 4.2  CL 93*  93* 94* 94* 96* 94*  CO2 '26  26 28 27 25 23  '$ GLUCOSE 195*  193* 135* 112* 366* 328*  BUN 38*  37* 18 20 30* 37*  CREATININE 2.99*  3.00* 2.04* 2.25* 3.33* 3.88*  CALCIUM 8.1*  8.1* 7.8* 7.9* 8.0* 8.4*  PHOS 2.6 1.7*  --  3.4  --    Liver Function Tests: Recent Labs  Lab 06/05/22 0253 06/06/22 0533 06/07/22 0141 06/08/22 0742  AST 11*  --   --   --   ALT 11  --   --   --   ALKPHOS 101  --   --   --   BILITOT 0.6  --   --   --   PROT 5.8*  --   --   --   ALBUMIN 2.0* 1.6* 1.7* 1.8*   Recent Labs  Lab 06/05/22 0253  LIPASE 28   CBC: Recent Labs  Lab 06/05/22 0253 06/06/22 0533 06/07/22 0141 06/07/22 1426 06/08/22 0742 06/09/22 0332  WBC 10.4 10.7* 9.6  --  7.5 7.4  NEUTROABS 8.6*  --   --   --   --   --   HGB 8.9* 8.3* 7.9* 8.7* 7.7* 8.0*  HCT 29.1* 26.5* 26.3*  27.9* 24.8* 27.2*  MCV 88.2 87.5 88.0  --  87.3 89.2  PLT 246 187 201  --  186 203   Blood Culture    Component Value Date/Time   SDES BLOOD RIGHT ARM 06/05/2022 0921   SPECREQUEST  06/05/2022 0921    BOTTLES DRAWN AEROBIC AND ANAEROBIC Blood Culture results may not be optimal due to an inadequate volume of blood received in culture bottles   CULT  06/05/2022 0921    NO GROWTH 3 DAYS Performed at [facility], [place] [address], [place], [place] [address]    REPTSTATUS PENDING 06/05/2022 0931    Cardiac Enzymes: No results for input(s): "CKTOTAL", "CKMB", "CKMBINDEX", "TROPONINI" in the last 168 hours. CBG: Recent Labs  Lab 06/08/22 0736 06/08/22 1116 06/08/22 1643 06/08/22 2051 06/09/22 0702  GLUCAP 366* 284* 293* 329* 273*   Iron Studies: No results for input(s): "IRON", "TIBC", "TRANSFERRIN", "FERRITIN" in  the last 72 hours. Lab Results  Component Value Date   INR 1.4 (H) 05/22/2022   INR 1.5 (H) 02/01/2022   INR 1.3 (H) 01/14/2022   Studies/Results: DG Abd 1 View  Result Date: 06/09/2022 CLINICAL DATA:  Constipation.  Diverticulitis. EXAM: ABDOMEN - 1 VIEW COMPARISON:  08/15/2020 bowel FINDINGS: Breast pattern is nonobstructive. There is no free intraperitoneal air. No evidence for organomegaly. Moderate stool burden. Degenerative changes are seen in the spine. IMPRESSION: Nonobstructive bowel gas pattern. Moderate stool burden. Electronically Signed   By: Nolon Nations M.D.   On: 06/09/2022 07:21   DG CHEST PORT 1 VIEW  Result Date: 06/07/2022 CLINICAL DATA:  Shortness of breath EXAM: PORTABLE CHEST 1 VIEW COMPARISON:  Chest x-ray May 26, 2022 FINDINGS: Cardiomediastinal silhouette is stable. No pneumothorax. No nodules or masses. No focal infiltrates. Stable right central line terminating in the SVC. No overt edema. IMPRESSION: No active disease. Electronically Signed   By: Dorise Bullion III M.D.   On: 06/07/2022 10:49    Medications:   amiodarone  200  mg Oral Daily   amoxicillin-clavulanate  1 tablet Oral Q12H   Chlorhexidine Gluconate Cloth  6 each Topical Q0600   docusate sodium  100 mg Oral BID   Gerhardt's butt cream   Topical TID   guaiFENesin  600 mg Oral BID   insulin aspart  0-15 Units Subcutaneous TID WC   insulin aspart  0-5 Units Subcutaneous QHS   insulin aspart  3 Units Subcutaneous TID WC   insulin glargine-yfgn  23 Units Subcutaneous QHS   ipratropium-albuterol  3 mL Nebulization TID   levothyroxine  50 mcg Oral QAC breakfast   midodrine  10 mg Oral Q M,W,F-HD   midodrine  5 mg Oral TID WC   multivitamin  1 tablet Oral Daily   pantoprazole  40 mg Oral Daily   polyethylene glycol  17 g Oral BID   pregabalin  75 mg Oral Daily   saccharomyces boulardii  500 mg Oral BID   senna  1 tablet Oral Daily   sodium chloride flush  3 mL Intravenous Q12H   vancomycin  125 mg Oral QID   vortioxetine HBr  20 mg Oral q AM    Dialysis Orders: SW MWF  4h  400/800   97kg   3K/2.5Ca bath  TDC  Hep none - last HD 1/10, post wt 98.8 - mircera 150 mcg q2, last 1/08, due 1/22  Assessment/Plan: Abd pain/ stercoral proctitis w/o evidence of perfortaion/ acute diverticulitis - per abd CT. Hx of recent Cdif infection. Po vanc started for suspected Cdif, gen surg and ID consulted. On Augmentin R BKA Dehiscence - Followed by Dr. Sharol Given: plan for tentative surgery on Friday 1/19 but need to resolve current medical issues first. DM2 - uncont, per pmd ESRD - on HD MWF. Received HD yesterday. Next HD 06/09/21.  On midodrine HTN/ volume - sig LE / hip edema on examm, no resp issues. BP's are soft at admit but now stable. Get a good bed weight.  Continue max UF as tolerated.  Anemia esrd - Hb now 7.7, next ESA due on Jan 22. Transfuse prn.  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 Holding binders for now. Not on vdra. Monitor trend.  Albumin 1.7, add prosource PAF - no longer on a/c due to hx of GIB H/o PAD - sp R BKA Recent bleeding rectal ulcer -  rx'd surgically dec 2023  Madelon Lips MD  [facility] [phone] 06/09/2022,9:14 AM  LOS: 4 days

## 2022-06-09 NOTE — Progress Notes (Addendum)
[facility] Progress Note     Subjective: CC:  Reports increased abd discomfort and suptrapubic tenderness. Reports rectal pressure like she needs to have BM. Endorses nausea but no vomiting.   Objective: Vital signs in last 24 hours: Temp:  [97.6 F (36.4 C)-98.3 F (36.8 C)] 98 F (36.7 C) (01/15 1210) Pulse Rate:  [74-86] 82 (01/15 1355) Resp:  [10-20] 17 (01/15 1210) BP: (93-151)/(39-70) 151/70 (01/15 1355) SpO2:  [92 %-100 %] 100 % (01/15 1210) Weight:  [92.1 kg-98.2 kg] 92.1 kg (01/15 1129) Last BM Date : 06/07/22  Intake/Output from previous day: 01/14 0701 - 01/15 0700 In: 1440 [P.O.:1440] Out: 0  Intake/Output this shift: Total I/O In: 400 [P.O.:400] Out: 3000 [Other:3000]  PE: Gen:  Alert, NAD, pleasant Card:  Regular rate and rhythm, LLE edema present  Pulm:  Normal effort on nasal cannula  Abd: Soft, interval increase in abdominal distention, tender in suprapubic region without peritonitis  Psych: A&Ox3   Lab Results:  Recent Labs    06/08/22 0742 06/09/22 0332  WBC 7.5 7.4  HGB 7.7* 8.0*  HCT 24.8* 27.2*  PLT 186 203   BMET Recent Labs    06/08/22 0742 06/09/22 0332  NA 129* 128*  K 4.1 4.2  CL 96* 94*  CO2 25 23  GLUCOSE 366* 328*  BUN 30* 37*  CREATININE 3.33* 3.88*  CALCIUM 8.0* 8.4*   PT/INR No results for input(s): "LABPROT", "INR" in the last 72 hours. CMP     Component Value Date/Time   NA 128 (L) 06/09/2022 0332   K 4.2 06/09/2022 0332   CL 94 (L) 06/09/2022 0332   CO2 23 06/09/2022 0332   GLUCOSE 328 (H) 06/09/2022 0332   GLUCOSE >444 (H) 06/07/2015 1311   BUN 37 (H) 06/09/2022 0332   CREATININE 3.88 (H) 06/09/2022 0332   CREATININE 6.22 (H) 03/26/2022 1620   CALCIUM 8.4 (L) 06/09/2022 0332   PROT 5.8 (L) 06/05/2022 0253   ALBUMIN 1.8 (L) 06/08/2022 0742   AST 11 (L) 06/05/2022 0253   ALT 11 06/05/2022 0253   ALKPHOS 101 06/05/2022 0253   BILITOT 0.6 06/05/2022 0253   GFRNONAA 12 (L) 06/09/2022 0332    GFRNONAA 11 (L) 11/15/2020 1452   GFRAA 13 (L) 11/15/2020 1452   Lipase     Component Value Date/Time   LIPASE 28 06/05/2022 0253       Studies/Results: DG Abd 1 View  Result Date: 06/09/2022 CLINICAL DATA:  Constipation.  Diverticulitis. EXAM: ABDOMEN - 1 VIEW COMPARISON:  08/15/2020 bowel FINDINGS: Breast pattern is nonobstructive. There is no free intraperitoneal air. No evidence for organomegaly. Moderate stool burden. Degenerative changes are seen in the spine. IMPRESSION: Nonobstructive bowel gas pattern. Moderate stool burden. Electronically Signed   By: Nolon Nations M.D.   On: 06/09/2022 07:21    Anti-infectives: Anti-infectives (From admission, onward)    Start     Dose/Rate Route Frequency Ordered Stop   07/12/22 1000  vancomycin (VANCOCIN) capsule 125 mg  Status:  Discontinued       See Hyperspace for full Linked Orders Report.   125 mg Oral Every 3 DAYS 06/05/22 2301 06/06/22 1030   07/04/22 1000  vancomycin (VANCOCIN) capsule 125 mg  Status:  Discontinued       See Hyperspace for full Linked Orders Report.   125 mg Oral Every other day 06/05/22 2301 06/06/22 1030   06/27/22 1000  vancomycin (VANCOCIN) capsule 125 mg  Status:  Discontinued  See Hyperspace for full Linked Orders Report.   125 mg Oral Daily 06/05/22 2301 06/06/22 1030   06/19/22 2200  vancomycin (VANCOCIN) capsule 125 mg  Status:  Discontinued       See Hyperspace for full Linked Orders Report.   125 mg Oral 2 times daily 06/05/22 2301 06/06/22 1030   06/07/22 1445  amoxicillin-clavulanate (AUGMENTIN) 875-125 MG per tablet 1 tablet  Status:  Discontinued        1 tablet Oral Every 12 hours 06/07/22 1349 06/07/22 1351   06/07/22 1445  vancomycin (VANCOCIN) capsule 125 mg        125 mg Oral 4 times daily 06/07/22 1350 06/17/22 1359   06/07/22 1445  amoxicillin-clavulanate (AUGMENTIN) 500-125 MG per tablet 1 tablet        1 tablet Oral Every 12 hours 06/07/22 1351 06/10/22 0959   06/06/22  0000  vancomycin (VANCOCIN) capsule 125 mg  Status:  Discontinued       See Hyperspace for full Linked Orders Report.   125 mg Oral 4 times daily 06/05/22 2301 06/06/22 1030   06/05/22 1000  piperacillin-tazobactam (ZOSYN) IVPB 2.25 g  Status:  Discontinued        2.25 g 100 mL/hr over 30 Minutes Intravenous Every 8 hours 06/05/22 0931 06/06/22 1042   06/05/22 0930  metroNIDAZOLE (FLAGYL) IVPB 500 mg  Status:  Discontinued        500 mg 100 mL/hr over 60 Minutes Intravenous Every 12 hours 06/05/22 0925 06/05/22 0929   06/05/22 0915  metroNIDAZOLE (FLAGYL) IVPB 500 mg  Status:  Discontinued        500 mg 100 mL/hr over 60 Minutes Intravenous  Once 06/05/22 0900 06/05/22 1043   06/05/22 0915  piperacillin-tazobactam (ZOSYN) IVPB 3.375 g  Status:  Discontinued        3.375 g 12.5 mL/hr over 240 Minutes Intravenous Every 8 hours 06/05/22 0901 06/05/22 0926        Assessment/Plan 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 She has SP tenderness. KUB consistent with constipation and likely still stool ball in rectal vault. - no emergent surgical needs - give mag  citrate today. If remains constipated could consider disimpaction in the OR Friday when she goes for surgery with Dr. Sharol Given - NOTHING PER RECTUM to avoid further irritation and recurrent rectal bleeding.   Perisplenic collection - unclear etiology; possible spontaneous subcapsular hematoma, despite over two weeks off of DOAC. Lower suspicion for infectious collection as there is no gas in the collection. Monitor H&H and abd exam.     ?c.dif colitis - I think test is positive because of her previous episode, no evidence of acute c.dif. Per primary team and ID; exam benign no emergent surgical needs   FEN - FLD VTE - SCD's, ok for DVT ppx, hold anticoagulation ID - Zosyn 1/11-1/12, discussed with ID, stop abx. No signs of perforation  or abscess. No acute c.dif. but high risk for development of c.dif.  monitor exam, vitals, WBC. Admit - TRH service    Recent admission for LGIB  Hx recurrent c.dif - followed by ID, last seen in November, improving; c.dif pending  CHF ESRD on HD DM2 Hypothyroidism ?cirrhosis     LOS: 4 days   I reviewed nursing notes, hospitalist notes, last 24 h vitals and pain scores, last 48 h intake and output, last 24 h labs and trends, and last  24 h imaging results.   Obie Dredge, PA-C [facility] Please see Amion for pager number during day hours 7:00am-4:30pm

## 2022-06-09 NOTE — Consult Note (Signed)
WOC Nurse Consult Note: Reason for Consult:right below the knee amputation dehiscence Followed by Dr. Sharol Given  Wound type:surgical  Pressure Injury POA: NA Measurement: see nursing flow sheets Wound bed: surgical line necrosis  Drainage (amount, consistency, odor) none Periwound: intact Dressing procedure/placement/frequency: Paint area with betadine daily, allow to air dry Cover with dry dressing Pending surgical intervention 1/19 with Dr. Sharol Given   Re consult if needed, will not follow at this time. Thanks  Demiya Magno [employer], RN,CWOCN, CNS, [place] [phone])

## 2022-06-09 NOTE — TOC Initial Note (Signed)
Transition of Care [facility]) - Initial/Assessment Note    Patient Details  Name: Susan [name] MRN: [id number] Date of Birth: 1950-11-09  [facility]) CM/SW Contact:    Milinda Antis, [place] Phone Number: 06/09/2022, 2:49 PM  Clinical Narrative:                 CSW received consult for possible SNF placement at time of discharge. CSW spoke with patient. Patient expressed understanding of PT recommendation and is agreeable to SNF placement at time of discharge. Patient reports preference to return to [facility]. CSW discussed insurance authorization process and will provide Medicare SNF ratings list. CSW will send out referrals for review and provide bed offers as available.   Skilled Nursing Rehab Facilities-   [email or web address]   Ratings out of 5 stars (5 the highest)   Name Address  Phone # [place] Inspection Overall  [facility] [address] [place] '4 5 2 3  '$ [facility]  [address] [place], [place] [phone] '4 2 5 5  '$ [facility] [place], [place] '1 3 1 1  '$ [place], [place] '2 2 4 4  '$ [facility] [address] [place] '2 1 2 1  '$ [place]. [address] [place] [phone] '3 3 4 4  '$ [facility] [address] [place] '4 1 3 2  '$ [facility] [address], [place] '4 1 3 2  '$ [address] ([place]) [place], [place] [phone] '3 1 2 1  '$ [facility] [phone], [place] [phone] '3 1 1 1  '$ [facility] [address], [facility] [phone] '3 2 2 2  '$ [facility] ([place]) [place]. [place], [place] [phone] '3 1 1 1  '$ Dustin Flock [address] [place] [phone] '4 2 4 4          '$ [place] [address] [place] '4 1 3 2  '$ [facility] [place] [address], [place] '3 1 5 4  '$ [address], [place], [place] [phone] '1 1 2 1  '$ [facility] [address] [employer] [phone] '2 2 4 4          '$ [address] (no [facility]) [place] [address], [place] [phone] '5 5 5 5  '$ [facility] (No Humana) [name], [place] '4 1 4 3  '$ [facility] (No UHC) [place], [place] '5 5 5 5  '$ [facility] [address] [employer] [phone] '2 3 5 5  '$ [facility] [address], [place] '1 1 2 1  '$ [facility] [address], [place] [phone] '3 1 1 1  '$ [facility] [place], [place] '5 2 5 5  '$ [facility]  [address], [place] '2 2 1 1  '$ [facility] [address], [place] '3 2 1 1  '$ [facility] [place], [place] '2 2 2 2          '$ [facility] [address], [place] [phone] '1 1 1 1  '$ [facility] [address], [place]  [phone] '2 4 3 3  '$ [facility] [place] [address], [place] [phone] '3 2 3 3  '$ [address], [place] '2 1 1 1  '$ [place] (No Humana) [address], [place] [phone] '2 2 3 3  '$ [facility]) [address], [place] [phone] '2 1 1 1          '$ [facility] [place], [place] '5 4 5 5  '$ [facility])  [address], [place] '2 1 2 1  '$ [facility]) [address] [place], [place] '3 1 4 3  '$ [place] [address], [place] '3 3 4 4  '$ [address] [place], [place] '2 3 1 1  '$ [facility]) [address] [place] [phone] '2 1 4 3    '$ Expected Discharge Plan: [place] Barriers to Discharge: Continued Medical Work up, Insurance Authorization   Patient Goals and CMS Choice Patient states their goals for this hospitalization and ongoing recovery are:: To return to SNF CMS Medicare.gov Compare Post Acute Care list provided to:: Patient Choice offered to / list presented to : Patient      Expected Discharge Plan and Services In-house Referral: Clinical Social Work   Post Acute Care Choice: [place] Living arrangements for the past 2 months: [place]                                      Prior Living Arrangements/Services Living arrangements for the past 2 months: [place] Lives with:: Facility Resident Patient language and need for interpreter reviewed:: Yes Do you feel safe going back to the place where you live?: Yes      Need for Family Participation in Patient Care: Yes (Comment) Care giver support system in place?: Yes (comment)   Criminal Activity/Legal Involvement Pertinent to Current Situation/Hospitalization: No - Comment as needed  Activities of Daily Living      Permission Sought/Granted   Permission granted to share information with : Yes, Verbal Permission Granted     Permission granted to share info w AGENCY: SNF        Emotional Assessment Appearance:: Appears older than stated age Attitude/Demeanor/Rapport: Engaged Affect (typically observed): Appropriate Orientation: : Oriented to Situation, Oriented to  Time, Oriented to Place, Oriented to Self Alcohol / Substance Use: Not Applicable Psych Involvement: No (comment)  Admission diagnosis:  Diverticulitis [K57.92] Spleen hematoma, initial encounter [S36.029A] Patient Active Problem List   Diagnosis Date Noted   Constipation 06/07/2022   History of Clostridioides difficile colitis 06/06/2022   Below-knee amputation of right lower extremity ([place]) 06/06/2022   Diverticulitis 06/05/2022    Stercoral colitis 06/05/2022   C. difficile colitis 06/05/2022   Spleen hematoma 06/05/2022   Dehiscence of amputation stump of right lower extremity ([place]) 06/05/2022   Rectal ulcer 05/27/2022   ESRD on dialysis ([place]) 05/27/2022   GI bleed 05/23/2022   Difficult intravenous access 05/23/2022   Gangrene of right foot ([place]) 05/02/2022   S/P BKA (below knee amputation) unilateral, right ([place]) 05/02/2022   Unspecified protein-calorie malnutrition ([place]) 04/15/2022   Secondary hyperparathyroidism of renal origin ([place]) 04/14/2022   Coagulation defect, unspecified ([place]) 04/09/2022   Acquired absence of other left toe(s) ([place]) 04/07/2022   Allergy, unspecified, initial encounter 04/07/2022   Dependence on renal dialysis ([place]) 04/07/2022   Gout due to renal impairment, unspecified site 04/07/2022   Hypertensive heart and chronic kidney  disease with heart failure and with stage 5 chronic kidney disease, or end stage renal disease ([place]) 04/07/2022   Personal history of transient ischemic attack (TIA), and cerebral infarction without residual deficits 04/07/2022   Renal osteodystrophy 04/07/2022   Venous stasis ulcer of right calf ([place]) 03/31/2022   Fistula, colovaginal 03/26/2022   Diarrhea 03/26/2022   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   Sepsis without acute organ dysfunction ([place])    Bacteremia    Acute pancreatitis 02/01/2022   Abdominal pain 02/01/2022   SIRS (systemic inflammatory response syndrome) ([place]) 02/01/2022   Transaminitis 02/01/2022   History of anemia due to chronic kidney disease 02/01/2022   Paroxysmal atrial fibrillation ([place]) 02/01/2022   DKA (diabetic ketoacidosis) ([place]) 01/14/2022   NSTEMI (non-ST elevated myocardial infarction) ([place]) 03/05/2021   Acute renal failure superimposed on stage 4 chronic kidney disease ([place]) 08/22/2020   Hypoalbuminemia 05/25/2020   GERD (gastroesophageal reflux disease) 05/25/2020   Pressure injury of skin 05/17/2020   Type 2 diabetes  mellitus with diabetic polyneuropathy, with long-term current use of insulin ([place]) 03/07/2020   Obesity, Class III, BMI 40-49.9 (morbid obesity) ([place]) 03/07/2020   Common bile duct (CBD) obstruction 05/28/2019   Benign neoplasm of ascending colon    Benign neoplasm of transverse colon    Benign neoplasm of descending colon    Benign neoplasm of sigmoid colon    Gastric polyps    Hyperkalemia 03/11/2019   Prolonged QT interval 03/11/2019   Acute blood loss anemia 03/11/2019   Onychomycosis 06/21/2018   Osteomyelitis of second toe of right foot ([place])    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 (HCC)    PVD (peripheral vascular disease) ([place]) 10/26/2017   E-coli UTI 07/27/2017   Hypothyroidism 07/27/2017   AKI (acute kidney injury) ([place])    [place] (pulmonary artery hypertension) ([place])    Impaired ambulation 07/19/2017   Nausea & vomiting 07/15/2017   Leg cramps 02/27/2017   Peripheral edema 01/12/2017   Diabetic neuropathy ([place]) 11/12/2016   CKD (chronic kidney disease), stage IV ([place]) 10/24/2015   Anemia of chronic disease 10/03/2015   Generalized anxiety disorder 10/03/2015   Insomnia 10/03/2015   Hyperglycemia due to diabetes mellitus ([place]) 06/07/2015   Chronic diastolic CHF (congestive heart failure) ([place]) 06/07/2015   Non compliance with medical treatment 04/17/2014   Rotator cuff tear 03/14/2014   Class 3 obesity ([place]) 09/23/2013   Chronic HFrEF (heart failure with reduced ejection fraction) ([place]) 06/03/2013   Hypotension 12/25/2012   Urinary incontinence    MDD (major depressive disorder) 11/12/2010   RBBB (right bundle branch block)    Coronary artery disease    Hyperlipemia 01/22/2009   Essential hypertension 01/22/2009   PCP:  [name], MD Pharmacy:   [id number] [place] [address] [place]Phone: [phone]: [phone]    Social  Determinants of Health (SDOH) Social History: SDOH Screenings   Food Insecurity: No Food Insecurity (05/26/2022)  Housing: Low Risk  (05/26/2022)  Transportation Needs: No Transportation Needs (05/26/2022)  Utilities: Not At Risk (05/26/2022)  Alcohol Screen: Low Risk  (08/16/2021)  Depression (PHQ2-9): Low Risk  (04/23/2022)  Financial Resource Strain: Low Risk  (08/16/2021)  Physical Activity: Inactive (08/16/2021)  Social Connections: Moderately Isolated (08/16/2021)  Stress: No Stress Concern Present (08/16/2021)  Tobacco Use: Medium Risk (06/05/2022)   SDOH Interventions:     Readmission Risk Interventions  05/12/2022    5:04 PM 02/07/2022    2:57 PM 01/23/2022   12:38 PM  Readmission Risk Prevention Plan  Transportation Screening Complete Complete Complete  PCP or Specialist Appt within 3-5 Days   Complete  HRI or [place]   Complete  Social Work Consult for [place] Planning/Counseling   Complete  Palliative Care Screening   Not Applicable  Medication Review Press photographer)  Complete Complete  PCP or Specialist appointment within 3-5 days of discharge  Complete   HRI or [place]  Complete   SW Recovery Care/Counseling Consult Complete Complete   Palliative Care Screening  Not [place] Complete Not Applicable

## 2022-06-09 NOTE — Progress Notes (Signed)
   06/09/22 1129  Vitals  Temp 98 F (36.7 C)  Temp Source Oral  BP (!) 120/41  MAP (mmHg) (!) 64  BP Location Right Arm  BP Method Automatic  Patient Position (if appropriate) Lying  Pulse Rate 82  Pulse Rate Source Monitor  ECG Heart Rate 82  Resp 10  Oxygen Therapy  SpO2 100 %  O2 Device Nasal Cannula  O2 Flow Rate (L/min) 3 L/min  Pulse Oximetry Type Continuous   Received patient in bed to unit.  Alert and oriented.  Informed consent signed and in chart.   Treatment initiated: 1062 Treatment completed: 1122   Patient tolerated well.  Transported back to the room  Alert, without acute distress.  Hand-off given to patient's nurse.   Access used: HD cath Access issues: lines had to be reversed  Total UF removed: 3074m Medication(s) given: Midodrine '10mg'$  po, Percocet 5/'325mg'$  po, Heparin Dwells 3800 units Post HD VS: see above Post HD weight: 92.1kg   [facility]

## 2022-06-09 NOTE — NC FL2 (Signed)
[place] LEVEL OF CARE FORM     IDENTIFICATION  Patient Name: Susan [name] Birthdate: 01-21-1951 Sex: female Admission Date (Current Location): 06/05/2022  [facility] and [place] Number:  Herbalist and Address:  [facility] [place], [place] [address], [place], [place] [address]      Provider Number: [id number]  Attending Physician Name and Address:  Elmarie Shiley, MD  Relative Name and Phone Number:       Current Level of Care: Hospital Recommended Level of Care: [place] Prior Approval Number:    Date Approved/Denied:   PASRR Number: [id number]  Discharge Plan: SNF    Current Diagnoses: Patient Active Problem List   Diagnosis Date Noted   Constipation 06/07/2022   History of Clostridioides difficile colitis 06/06/2022   Below-knee amputation of right lower extremity ([place]) 06/06/2022   Diverticulitis 06/05/2022   Stercoral colitis 06/05/2022   C. difficile colitis 06/05/2022   Spleen hematoma 06/05/2022   Dehiscence of amputation stump of right lower extremity ([place]) 06/05/2022   Rectal ulcer 05/27/2022   ESRD on dialysis ([place]) 05/27/2022   GI bleed 05/23/2022   Difficult intravenous access 05/23/2022   Gangrene of right foot ([place]) 05/02/2022   S/P BKA (below knee amputation) unilateral, right ([place]) 05/02/2022   Unspecified protein-calorie malnutrition ([place]) 04/15/2022   Secondary hyperparathyroidism of renal origin ([place]) 04/14/2022   Coagulation defect, unspecified ([place]) 04/09/2022   Acquired absence of other left toe(s) ([place]) 04/07/2022   Allergy, unspecified, initial encounter 04/07/2022   Dependence on renal dialysis ([place]) 04/07/2022   Gout due to renal impairment, unspecified site 04/07/2022   Hypertensive heart and chronic kidney disease with heart failure and with stage 5 chronic kidney disease, or end stage renal disease ([place]) 04/07/2022   Personal history of transient ischemic attack (TIA), and  cerebral infarction without residual deficits 04/07/2022   Renal osteodystrophy 04/07/2022   Venous stasis ulcer of right calf ([place]) 03/31/2022   Fistula, colovaginal 03/26/2022   Diarrhea 03/26/2022   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   Sepsis without acute organ dysfunction ([place])    Bacteremia    Acute pancreatitis 02/01/2022   Abdominal pain 02/01/2022   SIRS (systemic inflammatory response syndrome) ([place]) 02/01/2022   Transaminitis 02/01/2022   History of anemia due to chronic kidney disease 02/01/2022   Paroxysmal atrial fibrillation ([place]) 02/01/2022   DKA (diabetic ketoacidosis) ([place]) 01/14/2022   NSTEMI (non-ST elevated myocardial infarction) ([place]) 03/05/2021   Acute renal failure superimposed on stage 4 chronic kidney disease ([place]) 08/22/2020   Hypoalbuminemia 05/25/2020   GERD (gastroesophageal reflux disease) 05/25/2020   Pressure injury of skin 05/17/2020   Type 2 diabetes mellitus with diabetic polyneuropathy, with long-term current use of insulin ([place]) 03/07/2020   Obesity, Class III, BMI 40-49.9 (morbid obesity) ([place]) 03/07/2020   Common bile duct (CBD) obstruction 05/28/2019   Benign neoplasm of ascending colon    Benign neoplasm of transverse colon    Benign neoplasm of descending colon    Benign neoplasm of sigmoid colon    Gastric polyps    Hyperkalemia 03/11/2019   Prolonged QT interval 03/11/2019   Acute blood loss anemia 03/11/2019   Onychomycosis 06/21/2018   Osteomyelitis of second toe of right foot ([place])    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 (HCC)    PVD (peripheral vascular disease) ([place]) 10/26/2017   E-coli UTI 07/27/2017   Hypothyroidism 07/27/2017   AKI (acute kidney injury) ([place])    [place] (  pulmonary artery hypertension) (HCC)    Impaired ambulation 07/19/2017   Nausea & vomiting 07/15/2017   Leg cramps 02/27/2017   Peripheral edema 01/12/2017   Diabetic neuropathy ([place]) 11/12/2016   CKD (chronic kidney disease), stage IV  ([place]) 10/24/2015   Anemia of chronic disease 10/03/2015   Generalized anxiety disorder 10/03/2015   Insomnia 10/03/2015   Hyperglycemia due to diabetes mellitus ([place]) 06/07/2015   Chronic diastolic CHF (congestive heart failure) ([place]) 06/07/2015   Non compliance with medical treatment 04/17/2014   Rotator cuff tear 03/14/2014   Class 3 obesity ([place]) 09/23/2013   Chronic HFrEF (heart failure with reduced ejection fraction) ([place]) 06/03/2013   Hypotension 12/25/2012   Urinary incontinence    MDD (major depressive disorder) 11/12/2010   RBBB (right bundle branch block)    Coronary artery disease    Hyperlipemia 01/22/2009   Essential hypertension 01/22/2009    Orientation RESPIRATION BLADDER Height & Weight     Place, Situation, Time, Self  O2 (3L nasal cannula) Incontinent Weight: 203 lb 0.7 oz (92.1 kg) Height:     BEHAVIORAL SYMPTOMS/MOOD NEUROLOGICAL BOWEL NUTRITION STATUS      Continent Diet (see d/c summary)  AMBULATORY STATUS COMMUNICATION OF NEEDS Skin   Extensive Assist Verbally Other (Comment) (Buttocks deep tissue)                       Personal Care Assistance Level of Assistance  Bathing, Feeding, Dressing Bathing Assistance: Limited assistance Feeding assistance: Independent Dressing Assistance: Limited assistance     Functional Limitations Info    Sight Info: Adequate Hearing Info: Adequate Speech Info: Adequate    SPECIAL CARE FACTORS FREQUENCY  OT (By licensed OT), PT (By licensed PT)     PT Frequency: 5x/ weekly OT Frequency: 5x/ weekly            Contractures Contractures Info: Not present    Additional Factors Info  Code Status, Allergies Code Status Info: Full Allergies Info: Cephalexin  Codeine           Current Medications (06/09/2022):  This is the current hospital active medication list Current Facility-Administered Medications  Medication Dose Route Frequency Provider Last Rate Last Admin   (feeding supplement) PROSource  Plus liquid 30 mL  30 mL Oral BID BM Madelon Lips, MD   30 mL at 06/09/22 1351   acetaminophen (TYLENOL) tablet 650 mg  650 mg Oral Q6H PRN Norval Morton, MD   650 mg at 06/05/22 1343   Or   acetaminophen (TYLENOL) suppository 650 mg  650 mg Rectal Q6H PRN [name] Plan A, MD       albuterol (PROVENTIL) (2.5 MG/3ML) 0.083% nebulizer solution 2.5 mg  2.5 mg Nebulization Q6H PRN [name] Plan A, MD       amiodarone (PACERONE) tablet 200 mg  200 mg Oral Daily Smith, Rondell A, MD   200 mg at 06/09/22 1352   amoxicillin-clavulanate (AUGMENTIN) 500-125 MG per tablet 1 tablet  1 tablet Oral Q12H Reome, Earle J, RPH   1 tablet at 06/09/22 1354   bisacodyl (DULCOLAX) EC tablet 5 mg  5 mg Oral Once Regalado, Belkys A, MD       Chlorhexidine Gluconate Cloth 2 % PADS 6 each  6 each Topical Q0600 Roney Jaffe, MD   6 each at 06/07/22 0600   docusate sodium (COLACE) capsule 100 mg  100 mg Oral BID Jill Alexanders, PA-C   100 mg at 06/09/22 1354   fentaNYL (  SUBLIMAZE) injection 25 mcg  25 mcg Intravenous Q2H PRN Regalado, Belkys A, MD       Gerhardt's butt cream   Topical TID Jill Alexanders, PA-C   Given at 06/08/22 2130   guaiFENesin (MUCINEX) 12 hr tablet 600 mg  600 mg Oral BID Regalado, Belkys A, MD   600 mg at 06/09/22 1354   hydrALAZINE (APRESOLINE) tablet 25 mg  25 mg Oral Q6H PRN Smith, Rondell A, MD       insulin aspart (novoLOG) injection 0-15 Units  0-15 Units Subcutaneous TID WC Tamala Julian, Rondell A, MD   2 Units at 06/09/22 1218   insulin aspart (novoLOG) injection 0-5 Units  0-5 Units Subcutaneous QHS [name] Plan A, MD   4 Units at 06/08/22 2132   insulin aspart (novoLOG) injection 3 Units  3 Units Subcutaneous TID WC Regalado, Belkys A, MD   3 Units at 06/09/22 1219   insulin glargine-yfgn (SEMGLEE) injection 23 Units  23 Units Subcutaneous QHS Regalado, Belkys A, MD   23 Units at 06/08/22 2133   levothyroxine (SYNTHROID) tablet 50 mcg  50 mcg Oral QAC breakfast [name] Plan  A, MD   50 mcg at 06/09/22 0524   midodrine (PROAMATINE) tablet 10 mg  10 mg Oral Q M,W,F-HD [name] Plan A, MD   10 mg at 06/09/22 0725   midodrine (PROAMATINE) tablet 5 mg  5 mg Oral TID WC Smith, Rondell A, MD   5 mg at 06/09/22 1354   multivitamin (RENA-VIT) tablet 1 tablet  1 tablet Oral Daily [name] Plan A, MD   1 tablet at 06/09/22 1354   oxyCODONE-acetaminophen (PERCOCET/ROXICET) 5-325 MG per tablet 1 tablet  1 tablet Oral Q4H PRN [name] Plan A, MD   1 tablet at 06/09/22 1111   pantoprazole (PROTONIX) EC tablet 40 mg  40 mg Oral Daily Smith, Rondell A, MD   40 mg at 06/09/22 1354   polyethylene glycol (MIRALAX / GLYCOLAX) packet 17 g  17 g Oral BID Jill Alexanders, PA-C   17 g at 06/09/22 1350   pregabalin (LYRICA) capsule 75 mg  75 mg Oral Daily Smith, Rondell A, MD   75 mg at 06/09/22 1354   saccharomyces boulardii (FLORASTOR) capsule 500 mg  500 mg Oral BID [name] Plan A, MD   500 mg at 06/09/22 1354   senna (SENOKOT) tablet 8.6 mg  1 tablet Oral Daily Regalado, Belkys A, MD   8.6 mg at 06/09/22 1355   sodium chloride flush (NS) 0.9 % injection 3 mL  3 mL Intravenous Q12H Smith, Rondell A, MD   3 mL at 06/09/22 1356   trimethobenzamide (TIGAN) injection 200 mg  200 mg Intramuscular Q6H PRN [name] Plan A, MD       vancomycin (VANCOCIN) capsule 125 mg  125 mg Oral QID Regalado, Belkys A, MD   125 mg at 06/09/22 1354   vortioxetine HBr (TRINTELLIX) tablet 20 mg  20 mg Oral q AM [name] Plan A, MD   20 mg at 06/09/22 1354     Discharge Medications: Please see discharge summary for a list of discharge medications.  Relevant Imaging Results:  Relevant Lab Results:   Additional Information SSN: [id number]; out-pt HD at [facility] on MWF  Jerline Linzy F Shadrack Brummitt, LCSWA

## 2022-06-09 NOTE — Progress Notes (Signed)
PROGRESS NOTE    Susan [name]  GEX:[id number] DOB: 28-Jun-1950 DOA: 06/05/2022 PCP: [name], MD   Brief Narrative: 72 year old with past medical history significant for hypertension, hyperlipidemia, PAF not on anticoagulation, diastolic heart failure, CAD, ESRD on hemodialysis MWF, chronic respiratory failure on oxygen, diabetes type 2, hypothyroidism, GI bleed, history of recurrent C. difficile colitis who presents with complaint of lower abdominal pain that is started the day prior to admission.  She also reported nausea, vomiting and diarrhea.  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 Sharol Given 1/12.  Of note recent admission from 12/28 until 1 8//2024 for acute lower GI bleed.  GI tried to perform flexible sigmoidoscopy x 2 which were unsuccessful.  General surgery was consulted and patient underwent exam under anesthesia with oversewing of rectal ulcer found on 05/24/2022 by Dr. Rich Number.  She had a total of 4 units of packed red blood cell transfusion during that hospitalization and Eliquis was recommended to be discontinued.    Assessment & Plan:   Principal Problem:   Diverticulitis Active Problems:   Stercoral colitis   C. difficile colitis   Spleen hematoma   Type 2 diabetes mellitus with diabetic polyneuropathy, with long-term current use of insulin (HCC)   ESRD on dialysis (HCC)   Paroxysmal atrial fibrillation (HCC)   Dehiscence of amputation stump of right lower extremity (HCC)   Anemia of chronic disease   Hypothyroidism   Prolonged QT interval   History of Clostridioides difficile colitis   Below-knee amputation of right lower extremity ([place])   Constipation  Severe Constipation:  C. difficile colitis: Ruled out  Stercoral colitis, sigmoid diverticulitis CT: Showed large stool ball in the rectum with a scattered rectal wall pneumatosis concerning for a stercoral colitis and noted of sigmoid diverticulosis with acute diverticulitis. General  surgery consulted recommended laxative.  ID doesn't think patient has C diff colitis.  ID recommend to hold on staring antibiotics.  General Surgery now recommend antibiotics for CT finding. I have order Augmentin BID for 3 days and prophylaxis oral Vancomycin/ antibiotics started 1/13 Discussed with General Sx PA, ok to advanced diet.  Had smear BM yesterday, KUB: non obstructive bowel gas pattern, moderate stool burden.  Follow Sx recommendation.  On laxatives.   Perisplenic collection: -CT abdomen pelvis with contrast noted 6 x 4 x 2 cm complex subcapsular collection in the superior aspect of the spleen with concern for hematoma versus infectious process. Monitor hemoglobin Surgery, think possible spontaneous subcapsular hematoma, lower suspicious for infectious collection as there is no gas.  Continue to monitor hemoglobin.  -Will hold aspirin and DVT prophylaxis.  Needs imagine follow up out patient for splenic hematoma. Probably need to repeat CT scan prior to discharge/  Hb 8.3--7.9--7.7 --8 stable.   Chronic Respiratory Failure: on 3 L oxygen at home in setting HF.  Report SOB today.  Repeated chest  x ray: No active diseases.  Schedule nebulizer treatment.  Report improvement with nebulizer treatments.   Diabetes type 2 uncontrolled with hyperglycemia Last A1c 8.9 Continue  Semglee 23 units sliding scale insulin Home regimen includes Lantus 10 units nightly, Tradjenta 5 units   ESRD, HD  M,W,F Nephrology consulted.  Had HD 1/12.  Paroxysmal A-fib: Continue amiodarone   Wound dehiscence,  status post right BKA Patient is scheduled to have revision of prior BKA due to wound dehiscence  by Dr. Sharol Given. Dr. Sharol Given has canceled procedure for 1/12. Sx end next week.   Anemia  of chronic disease: Hb baseline 8.9 Continue to monitor  Prolonged QT: Avoid prolonged QT medications.  Hypothyroidism: Continue with Synthroid.  History of GI bleed: Patient presented to the  hospital last month with GI bleed.  Ultimately underwent exam under anesthesia with oversewing of rectal ulcer found on 05/24/2022 by Dr. Rich Number.   Hyponatremia; Treat with HD   Pressure Injury 05/23/22 Buttocks Right Deep Tissue Pressure Injury - Purple or maroon localized area of discolored intact skin or blood-filled blister due to damage of underlying soft tissue from pressure and/or shear. (Active)  05/23/22 1715  Location: Buttocks  Location Orientation: Right  Staging: Deep Tissue Pressure Injury - Purple or maroon localized area of discolored intact skin or blood-filled blister due to damage of underlying soft tissue from pressure and/or shear.  Wound Description (Comments):   Present on Admission: Yes  Dressing Type Foam - Lift dressing to assess site every shift 05/29/22 0800     Pressure Injury 05/23/22 Right;Left;Posterior Stage 1 -  Intact skin with non-blanchable redness of a localized area usually over a bony prominence. (Active)  05/23/22   Location:   Location Orientation: Right;Left;Posterior  Staging: Stage 1 -  Intact skin with non-blanchable redness of a localized area usually over a bony prominence.  Wound Description (Comments):   Present on Admission: Yes  Dressing Type Foam - Lift dressing to assess site every shift 05/29/22 0800      Estimated body mass index is 33.79 kg/m as calculated from the following:   Height as of 05/26/22: '5\' 5"'$  (1.651 m).   Weight as of this encounter: 92.1 kg.   DVT prophylaxis: SCD Code Status: Full code Family Communication: Husband at bedside 1/13 Disposition Plan:  Status is: Inpatient Remains inpatient appropriate because: management of constipation.      Consultants:  ID Surgery  Procedures:  none  Antimicrobials:    Subjective: Seen in HD. She only had smear BM yesterday. Report more cramping abdominal pain today  Report SOB in HD, was only on 2 L---oxygen increase to 3.  She report improvement with  nebulizer treatments.   Objective: Vitals:   06/09/22 1100 06/09/22 1129 06/09/22 1210 06/09/22 1355  BP: (!) 119/59 (!) 120/41 (!) 118/44 (!) 151/70  Pulse: 76 82 86 82  Resp:  10 17   Temp:  98 F (36.7 C) 98 F (36.7 C)   TempSrc:  Oral    SpO2: 100% 100% 100%   Weight:  92.1 kg      Intake/Output Summary (Last 24 hours) at 06/09/2022 1358 Last data filed at 06/09/2022 1300 Gross per 24 hour  Intake 1240 ml  Output 3000 ml  Net -1760 ml    Filed Weights   06/09/22 0546 06/09/22 0734 06/09/22 1129  Weight: 98.2 kg 93.1 kg 92.1 kg    Examination:  General exam: NAD Respiratory system: CTA Cardiovascular system: S 1, S 2 RRR Gastrointestinal system: BS present, soft, nt Central nervous system: Non focal.  Extremities: right BKA cover, left LE with edema mild redness  Data Reviewed: I have personally reviewed following labs and imaging studies  CBC: Recent Labs  Lab 06/05/22 0253 06/06/22 0533 06/07/22 0141 06/07/22 1426 06/08/22 0742 06/09/22 0332  WBC 10.4 10.7* 9.6  --  7.5 7.4  NEUTROABS 8.6*  --   --   --   --   --   HGB 8.9* 8.3* 7.9* 8.7* 7.7* 8.0*  HCT 29.1* 26.5* 26.3* 27.9* 24.8* 27.2*  MCV 88.2 87.5 88.0  --  87.3 89.2  PLT 246 187 201  --  186 831    Basic Metabolic Panel: Recent Labs  Lab 06/06/22 0533 06/07/22 0141 06/07/22 0934 06/08/22 0742 06/09/22 0332  NA 130*  131* 129* 129* 129* 128*  K 3.7  3.8 4.0 3.6 4.1 4.2  CL 93*  93* 94* 94* 96* 94*  CO2 '26  26 28 27 25 23  '$ GLUCOSE 195*  193* 135* 112* 366* 328*  BUN 38*  37* 18 20 30* 37*  CREATININE 2.99*  3.00* 2.04* 2.25* 3.33* 3.88*  CALCIUM 8.1*  8.1* 7.8* 7.9* 8.0* 8.4*  MG 2.5*  --   --   --   --   PHOS 2.6 1.7*  --  3.4  --     GFR: Estimated Creatinine Clearance: 14.5 mL/min (A) (by C-G formula based on SCr of 3.88 mg/dL (H)). Liver Function Tests: Recent Labs  Lab 06/05/22 0253 06/06/22 0533 06/07/22 0141 06/08/22 0742  AST 11*  --   --   --   ALT 11  --    --   --   ALKPHOS 101  --   --   --   BILITOT 0.6  --   --   --   PROT 5.8*  --   --   --   ALBUMIN 2.0* 1.6* 1.7* 1.8*    Recent Labs  Lab 06/05/22 0253  LIPASE 28    No results for input(s): "AMMONIA" in the last 168 hours. Coagulation Profile: No results for input(s): "INR", "PROTIME" in the last 168 hours. Cardiac Enzymes: No results for input(s): "CKTOTAL", "CKMB", "CKMBINDEX", "TROPONINI" in the last 168 hours. BNP (last 3 results) No results for input(s): "PROBNP" in the last 8760 hours. HbA1C: No results for input(s): "HGBA1C" in the last 72 hours. CBG: Recent Labs  Lab 06/08/22 1116 06/08/22 1643 06/08/22 2051 06/09/22 0702 06/09/22 1208  GLUCAP 284* 293* 329* 273* 228*    Lipid Profile: No results for input(s): "CHOL", "HDL", "LDLCALC", "TRIG", "CHOLHDL", "LDLDIRECT" in the last 72 hours. Thyroid Function Tests: No results for input(s): "TSH", "T4TOTAL", "FREET4", "T3FREE", "THYROIDAB" in the last 72 hours. Anemia Panel: No results for input(s): "VITAMINB12", "FOLATE", "FERRITIN", "TIBC", "IRON", "RETICCTPCT" in the last 72 hours. Sepsis Labs: No results for input(s): "PROCALCITON", "LATICACIDVEN" in the last 168 hours.  Recent Results (from the past 240 hour(s))  C Difficile Quick Screen w PCR reflex     Status: Abnormal   Collection Time: 06/05/22  9:03 AM   Specimen: STOOL  Result Value Ref Range Status   C Diff antigen POSITIVE (A) NEGATIVE Final   C Diff toxin NEGATIVE NEGATIVE Final   C Diff interpretation Results are indeterminate. See PCR results.  Final    Comment: Performed at [facility], [place] [address], [place], [place] [address]  C. Diff by PCR, Reflexed     Status: Abnormal   Collection Time: 06/05/22  9:03 AM  Result Value Ref Range Status   Toxigenic C. Difficile by PCR POSITIVE (A) NEGATIVE Final    Comment: Positive for toxigenic C. difficile with little to no toxin production. Only treat if clinical presentation suggests  symptomatic illness. Performed at [facility], [place] [address] [place],  [address]   Blood culture (routine x 2)     Status: None (Preliminary result)   Collection Time: 06/05/22  9:21 AM   Specimen: BLOOD RIGHT ARM  Result Value Ref Range Status   Specimen Description BLOOD  RIGHT ARM  Final   Special Requests   Final    BOTTLES DRAWN AEROBIC AND ANAEROBIC Blood Culture results may not be optimal due to an inadequate volume of blood received in culture bottles   Culture   Final    NO GROWTH 4 DAYS Performed at [facility], [place] [address] [place], [place] [address]    Report Status PENDING  Incomplete         Radiology Studies: DG Abd 1 View  Result Date: 06/09/2022 CLINICAL DATA:  Constipation.  Diverticulitis. EXAM: ABDOMEN - 1 VIEW COMPARISON:  08/15/2020 bowel FINDINGS: Breast pattern is nonobstructive. There is no free intraperitoneal air. No evidence for organomegaly. Moderate stool burden. Degenerative changes are seen in the spine. IMPRESSION: Nonobstructive bowel gas pattern. Moderate stool burden. Electronically Signed   By: Nolon Nations M.D.   On: 06/09/2022 07:21        Scheduled Meds:  (feeding supplement) PROSource Plus  30 mL Oral BID BM   amiodarone  200 mg Oral Daily   amoxicillin-clavulanate  1 tablet Oral Q12H   Chlorhexidine Gluconate Cloth  6 each Topical Q0600   docusate sodium  100 mg Oral BID   Gerhardt's butt cream   Topical TID   guaiFENesin  600 mg Oral BID   insulin aspart  0-15 Units Subcutaneous TID WC   insulin aspart  0-5 Units Subcutaneous QHS   insulin aspart  3 Units Subcutaneous TID WC   insulin glargine-yfgn  23 Units Subcutaneous QHS   ipratropium-albuterol  3 mL Nebulization TID   levothyroxine  50 mcg Oral QAC breakfast   magnesium citrate  1 Bottle Oral Once   midodrine  10 mg Oral Q M,W,F-HD   midodrine  5 mg Oral TID WC   multivitamin  1 tablet Oral Daily   pantoprazole  40 mg Oral Daily    polyethylene glycol  17 g Oral BID   pregabalin  75 mg Oral Daily   saccharomyces boulardii  500 mg Oral BID   senna  1 tablet Oral Daily   sodium chloride flush  3 mL Intravenous Q12H   vancomycin  125 mg Oral QID   vortioxetine HBr  20 mg Oral q AM   Continuous Infusions:     LOS: 4 days    Time spent: 35 minutes.    Elmarie Shiley, MD [facility]   If 7PM-7AM, please contact night-coverage [email or web address]  06/09/2022, 1:58 PM

## 2022-06-10 ENCOUNTER — Inpatient Hospital Stay (HOSPITAL_COMMUNITY): Payer: PPO

## 2022-06-10 DIAGNOSIS — K5289 Other specified noninfective gastroenteritis and colitis: Secondary | ICD-10-CM | POA: Diagnosis not present

## 2022-06-10 DIAGNOSIS — K5792 Diverticulitis of intestine, part unspecified, without perforation or abscess without bleeding: Secondary | ICD-10-CM | POA: Diagnosis not present

## 2022-06-10 DIAGNOSIS — K5900 Constipation, unspecified: Secondary | ICD-10-CM | POA: Diagnosis not present

## 2022-06-10 LAB — BASIC METABOLIC PANEL
Anion gap: 6 (ref 5–15)
BUN: 27 mg/dL — ABNORMAL HIGH (ref 8–23)
CO2: 29 mmol/L (ref 22–32)
Calcium: 8.5 mg/dL — ABNORMAL LOW (ref 8.9–10.3)
Chloride: 95 mmol/L — ABNORMAL LOW (ref 98–111)
Creatinine, Ser: 2.3 mg/dL — ABNORMAL HIGH (ref 0.44–1.00)
GFR, Estimated: 22 mL/min — ABNORMAL LOW (ref >60)
Glucose, Bld: 343 mg/dL — ABNORMAL HIGH (ref 70–99)
Potassium: 3.8 mmol/L (ref 3.5–5.1)
Sodium: 130 mmol/L — ABNORMAL LOW (ref 135–145)

## 2022-06-10 LAB — GLUCOSE, CAPILLARY
Glucose-Capillary: 229 mg/dL — ABNORMAL HIGH (ref 70–99)
Glucose-Capillary: 147 mg/dL — ABNORMAL HIGH (ref 70–99)
Glucose-Capillary: 180 mg/dL — ABNORMAL HIGH (ref 70–99)
Glucose-Capillary: 297 mg/dL — ABNORMAL HIGH (ref 70–99)

## 2022-06-10 LAB — CBC
HCT: 25.9 % — ABNORMAL LOW (ref 36.0–46.0)
Hemoglobin: 7.7 g/dL — ABNORMAL LOW (ref 12.0–15.0)
MCH: 26 pg (ref 26.0–34.0)
MCHC: 29.7 g/dL — ABNORMAL LOW (ref 30.0–36.0)
MCV: 87.5 fL (ref 80.0–100.0)
Platelets: 199 10*3/uL (ref 150–400)
RBC: 2.96 MIL/uL — ABNORMAL LOW (ref 3.87–5.11)
RDW: 18.9 % — ABNORMAL HIGH (ref 11.5–15.5)
WBC: 7 10*3/uL (ref 4.0–10.5)
nRBC: 0 % (ref 0.0–0.2)

## 2022-06-10 LAB — CULTURE, BLOOD (ROUTINE X 2): Culture: NO GROWTH

## 2022-06-10 LAB — TROPONIN I (HIGH SENSITIVITY): Troponin I (High Sensitivity): 21 ng/L — ABNORMAL HIGH (ref <18)

## 2022-06-10 LAB — HEPATITIS B SURFACE ANTIBODY, QUANTITATIVE: Hep B S AB Quant (Post): 7.9 m[IU]/mL — ABNORMAL LOW (ref >9.9)

## 2022-06-10 LAB — PHOSPHORUS: Phosphorus: 2.6 mg/dL (ref 2.5–4.6)

## 2022-06-10 MED ORDER — GUAIFENESIN ER 600 MG PO TB12
600.0000 mg | ORAL_TABLET | Freq: Two times a day (BID) | ORAL | Status: DC
  Administered 2022-06-10 – 2022-06-17 (×13): 600 mg via ORAL
  Filled 2022-06-10 (×13): qty 1

## 2022-06-10 MED ORDER — BUDESONIDE 0.25 MG/2ML IN SUSP: 0.2500 mg | Freq: Two times a day (BID) | RESPIRATORY_TRACT | Status: DC

## 2022-06-10 MED ORDER — POLYETHYLENE GLYCOL 3350 17 GM/SCOOP PO POWD
0.5000 | Freq: Once | ORAL | Status: AC
  Administered 2022-06-10: 127.5 g via ORAL
  Filled 2022-06-10: qty 255

## 2022-06-10 NOTE — Progress Notes (Signed)
[facility] Progress Note     Subjective: CC:  Feels slightly better- less abd pain but still feels bloated. Reports she had a BM last night after dinner that was semi-solid and large volume.   Objective: Vital signs in last 24 hours: Temp:  [97.4 F (36.3 C)-98.5 F (36.9 C)] 97.6 F (36.4 C) (01/16 0853) Pulse Rate:  [78-83] 79 (01/16 0853) Resp:  [17-18] 17 (01/16 0853) BP: (111-151)/(26-70) 112/44 (01/16 0853) SpO2:  [99 %-100 %] 100 % (01/16 0853) Last BM Date : 06/07/22  Intake/Output from previous day: 01/15 0701 - 01/16 0700 In: 400 [P.O.:400] Out: 3000  Intake/Output this shift: Total I/O In: 220 [P.O.:220] Out: -   PE: Gen:  Alert, NAD, pleasant Card:  Regular rate and rhythm, LLE edema present  Pulm:  Normal effort on nasal cannula  Abd: Soft,  abdominal distention and tympany, non-tender Psych: A&Ox3   Lab Results:  Recent Labs    06/09/22 0332 06/10/22 0330  WBC 7.4 7.0  HGB 8.0* 7.7*  HCT 27.2* 25.9*  PLT 203 199   BMET Recent Labs    06/09/22 0332 06/10/22 0330  NA 128* 130*  K 4.2 3.8  CL 94* 95*  CO2 23 29  GLUCOSE 328* 343*  BUN 37* 27*  CREATININE 3.88* 2.30*  CALCIUM 8.4* 8.5*   PT/INR No results for input(s): "LABPROT", "INR" in the last 72 hours. CMP     Component Value Date/Time   NA 130 (L) 06/10/2022 0330   K 3.8 06/10/2022 0330   CL 95 (L) 06/10/2022 0330   CO2 29 06/10/2022 0330   GLUCOSE 343 (H) 06/10/2022 0330   GLUCOSE >444 (H) 06/07/2015 1311   BUN 27 (H) 06/10/2022 0330   CREATININE 2.30 (H) 06/10/2022 0330   CREATININE 6.22 (H) 03/26/2022 1620   CALCIUM 8.5 (L) 06/10/2022 0330   PROT 5.8 (L) 06/05/2022 0253   ALBUMIN 1.8 (L) 06/08/2022 0742   AST 11 (L) 06/05/2022 0253   ALT 11 06/05/2022 0253   ALKPHOS 101 06/05/2022 0253   BILITOT 0.6 06/05/2022 0253   GFRNONAA 22 (L) 06/10/2022 0330   GFRNONAA 11 (L) 11/15/2020 1452   GFRAA 13 (L) 11/15/2020 1452   Lipase     Component Value  Date/Time   LIPASE 28 06/05/2022 0253       Studies/Results: DG Abd 1 View  Result Date: 06/09/2022 CLINICAL DATA:  Constipation.  Diverticulitis. EXAM: ABDOMEN - 1 VIEW COMPARISON:  08/15/2020 bowel FINDINGS: Breast pattern is nonobstructive. There is no free intraperitoneal air. No evidence for organomegaly. Moderate stool burden. Degenerative changes are seen in the spine. IMPRESSION: Nonobstructive bowel gas pattern. Moderate stool burden. Electronically Signed   By: Nolon Nations M.D.   On: 06/09/2022 07:21    Anti-infectives: Anti-infectives (From admission, onward)    Start     Dose/Rate Route Frequency Ordered Stop   07/12/22 1000  vancomycin (VANCOCIN) capsule 125 mg  Status:  Discontinued       See Hyperspace for full Linked Orders Report.   125 mg Oral Every 3 DAYS 06/05/22 2301 06/06/22 1030   07/04/22 1000  vancomycin (VANCOCIN) capsule 125 mg  Status:  Discontinued       See Hyperspace for full Linked Orders Report.   125 mg Oral Every other day 06/05/22 2301 06/06/22 1030   06/27/22 1000  vancomycin (VANCOCIN) capsule 125 mg  Status:  Discontinued       See Hyperspace for full Linked Orders Report.  125 mg Oral Daily 06/05/22 2301 06/06/22 1030   06/19/22 2200  vancomycin (VANCOCIN) capsule 125 mg  Status:  Discontinued       See Hyperspace for full Linked Orders Report.   125 mg Oral 2 times daily 06/05/22 2301 06/06/22 1030   06/07/22 1445  amoxicillin-clavulanate (AUGMENTIN) 875-125 MG per tablet 1 tablet  Status:  Discontinued        1 tablet Oral Every 12 hours 06/07/22 1349 06/07/22 1351   06/07/22 1445  vancomycin (VANCOCIN) capsule 125 mg        125 mg Oral 4 times daily 06/07/22 1350 06/17/22 1359   06/07/22 1445  amoxicillin-clavulanate (AUGMENTIN) 500-125 MG per tablet 1 tablet        1 tablet Oral Every 12 hours 06/07/22 1351 06/09/22 2212   06/06/22 0000  vancomycin (VANCOCIN) capsule 125 mg  Status:  Discontinued       See Hyperspace for full Linked  Orders Report.   125 mg Oral 4 times daily 06/05/22 2301 06/06/22 1030   06/05/22 1000  piperacillin-tazobactam (ZOSYN) IVPB 2.25 g  Status:  Discontinued        2.25 g 100 mL/hr over 30 Minutes Intravenous Every 8 hours 06/05/22 0931 06/06/22 1042   06/05/22 0930  metroNIDAZOLE (FLAGYL) IVPB 500 mg  Status:  Discontinued        500 mg 100 mL/hr over 60 Minutes Intravenous Every 12 hours 06/05/22 0925 06/05/22 0929   06/05/22 0915  metroNIDAZOLE (FLAGYL) IVPB 500 mg  Status:  Discontinued        500 mg 100 mL/hr over 60 Minutes Intravenous  Once 06/05/22 0900 06/05/22 1043   06/05/22 0915  piperacillin-tazobactam (ZOSYN) IVPB 3.375 g  Status:  Discontinued        3.375 g 12.5 mL/hr over 240 Minutes Intravenous Every 8 hours 06/05/22 0901 06/05/22 0926        Assessment/Plan 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 KUB 1/15 w/ constipation and likely still stool ball in rectal vault. Today she has had a BM and feels slightly better but remains bloated - repeat KUB. May need more laxatives today. - no emergent surgical needs - give mag  citrate today. If remains constipated could consider disimpaction in the OR Friday when she goes for surgery with Dr. Sharol Given - NOTHING PER RECTUM to avoid further irritation and recurrent rectal bleeding.   Perisplenic collection - unclear etiology; possible spontaneous subcapsular hematoma, despite over two weeks off of DOAC. Lower suspicion for infectious collection as there is no gas in the collection. Monitor H&H and abd exam.     ?c.dif colitis - I think test is positive because of her previous episode, no evidence of acute c.dif. Per primary team and ID; exam benign no emergent surgical needs   FEN - carb mod VTE - SCD's, ok for DVT ppx, hold anticoagulation ID - Zosyn completed, finished augmentin and on prophylactic vanc for hx c.dif Admit - [facility] service    Recent admission for LGIB  Hx recurrent c.dif -  followed by ID, last seen in November, improving; c.dif pending  CHF ESRD on HD DM2 Hypothyroidism ?cirrhosis     LOS: 5 days   I reviewed nursing notes, hospitalist notes, last 24 h vitals and pain scores, last 48 h intake and output, last 24 h labs and trends, and last 24 h imaging results.   Obie Dredge, PA-C [facility] Please see Amion for  pager number during day hours 7:00am-4:30pm

## 2022-06-10 NOTE — Progress Notes (Signed)
Physical Therapy Treatment Patient Details Name: Susan [name] MRN: [id number] DOB: 02-19-1951 Today's Date: 06/10/2022   History of Present Illness 72 y.o. female who presents dehiscence and ulceration right transtibial amputation.  Patient has been admitted for medical conditions including rectal bleeding and C. difficile.  Patient is currently on dialysis. 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 Able to roll and rise to EOB without physical assist using rails. Focused on weight shifting on EOB, weight bearing through LLE, and progressing towards sit<>stand transfer which she is not fully capable of yet. Pt shows improved buttock lift, just barely clearing the bed momentarily. Practiced scooting along bed with Mod assist and therapist using bed pad to reduce friction on buttocks. Patient will continue to benefit from skilled physical therapy services to further improve independence with functional mobility.   Recommendations for follow up therapy are one component of a multi-disciplinary discharge planning process, led by the attending physician.  Recommendations may be updated based on patient status, additional functional criteria and insurance authorization.  Follow Up Recommendations  Skilled nursing-short term rehab (<3 hours/day) Can patient physically be transported by private vehicle: No   Assistance Recommended at Discharge Intermittent Supervision/Assistance  Patient can return home with the following Two people to help with walking and/or transfers;Direct supervision/assist for medications management;Direct supervision/assist for financial management;Assist for transportation;Help with stairs or ramp for entrance;Two people to help with  bathing/dressing/bathroom;Assistance with cooking/housework   Equipment Recommendations   (TBD next venue)    Recommendations for Other Services       Precautions / Restrictions Precautions Precautions: Fall;Other (comment) Precaution Comments: recent R BKA and CDIFF Required Braces or Orthoses: Other Brace Other Brace: R BKA limb protector- skin wrapped Restrictions Weight Bearing Restrictions: Yes RLE Weight Bearing: Non weight bearing     Mobility  Bed Mobility Overal bed mobility: Needs Assistance Bed Mobility: Rolling, Sidelying to Sit, Sit to Sidelying Rolling: Supervision Sidelying to sit: Min guard, HOB elevated Supine to sit: Min assist     General bed mobility comments: Rolls left and right during pericare without physical assistance, using rail, great control. Min guard with rising to EOB, brings LEs to edge, needs a help to scoot forward and min assist for LLE back into bed lightly. Cues throughout. Assist to scoot up in bed in trendelenburg position using headboard to pull self as well.    Transfers Overall transfer level: Needs assistance Equipment used: None Transfers: Sit to/from Stand Sit to Stand: Total assist          Lateral/Scoot Transfers: Mod assist General transfer comment: Focused on forward weight shift, hand on back of chair, pushing through LLE. Able to lightly clear buttocks. Cues to press up and extend Lt knee to prevent sliding forward. Performed on EOB several times. Focused further efforts on similar techniques while scooting up in bed, mod assist while using bed pad to reduce friction on skin.    Ambulation/Gait                   Stairs             Wheelchair Mobility    Modified Rankin (Stroke Patients Only)       Balance Overall balance assessment: Needs assistance Sitting-balance support: No upper extremity supported,  Feet supported Sitting balance-Leahy Scale: Fair                                       Cognition Arousal/Alertness: Awake/alert Behavior During Therapy: WFL for tasks assessed/performed Overall Cognitive Status: Within Functional Limits for tasks assessed                                          Exercises General Exercises - Lower Extremity Ankle Circles/Pumps: AROM, Left, 15 reps, Supine Quad Sets: Strengthening, AROM, Both, 10 reps, Supine Gluteal Sets: Strengthening, Both, 10 reps, Supine Short Arc Quad: Strengthening, Both, 10 reps, Supine Other Exercises Other Exercises: Rt knee flexion x10 supine    General Comments General comments (skin integrity, edema, etc.): 4L O2. Bowel incontinence, assisted with pericare, linens changed, pt assisted with rolling, lifting legs      Pertinent Vitals/Pain Pain Assessment Pain Assessment: Faces Faces Pain Scale: Hurts little more Pain Location: buttocks/rectum Pain Descriptors / Indicators: Discomfort, Sharp Pain Intervention(s): Monitored during session, Repositioned    Home Living                          Prior Function            PT Goals (current goals can now be found in the care plan section) Acute Rehab PT Goals Patient Stated Goal: Get well, walk again PT Goal Formulation: With patient Time For Goal Achievement: 06/21/22 Potential to Achieve Goals: Fair Progress towards PT goals: Progressing toward goals    Frequency    Min 2X/week      PT Plan Current plan remains appropriate    Co-evaluation              AM-PAC PT "6 Clicks" Mobility   Outcome Measure  Help needed turning from your back to your side while in a flat bed without using bedrails?: A Little Help needed moving from lying on your back to sitting on the side of a flat bed without using bedrails?: A Little Help needed moving to and from a bed to a chair (including a wheelchair)?: Total Help needed standing up from a chair using your arms (e.g., wheelchair or bedside chair)?:  Total Help needed to walk in hospital room?: Total Help needed climbing 3-5 steps with a railing? : Total 6 Click Score: 10    End of Session Equipment Utilized During Treatment: Oxygen Activity Tolerance: Patient tolerated treatment well Patient left: in bed;with call bell/phone within reach;with bed alarm set Nurse Communication: Mobility status;Need for lift equipment PT Visit Diagnosis: Muscle weakness (generalized) (M62.81);Pain;History of falling (Z91.81);Difficulty in walking, not elsewhere classified (R26.2) Pain - part of body:  (buttocks/rectum)     Time: 0981-1914 PT Time Calculation (min) (ACUTE ONLY): 34 min  Charges:  $Therapeutic Activity: 23-37 mins                     Candie Mile, PT, DPT Physical Therapist Acute [facility] [place] 06/10/2022, 1:23 PM

## 2022-06-10 NOTE — Progress Notes (Signed)
PROGRESS NOTE    Susan [name]  EBR:[id number] DOB: 1950/11/06 DOA: 06/05/2022 PCP: [name], MD   Brief Narrative: 72 year old with past medical history significant for hypertension, hyperlipidemia, PAF not on anticoagulation, diastolic heart failure, CAD, ESRD on hemodialysis MWF, chronic respiratory failure on oxygen, diabetes type 2, hypothyroidism, GI bleed, history of recurrent C. difficile colitis who presents with complaint of lower abdominal pain that is started the day prior to admission.  She also reported nausea, vomiting and diarrhea.  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 Sharol Given 1/12.  Of note recent admission from 12/28 until 1 8//2024 for acute lower GI bleed.  GI tried to perform flexible sigmoidoscopy x 2 which were unsuccessful.  General surgery was consulted and patient underwent exam under anesthesia with oversewing of rectal ulcer found on 05/24/2022 by Dr. Rich Number.  She had a total of 4 units of packed red blood cell transfusion during that hospitalization and Eliquis was recommended to be discontinued.    Patient was admitted with severe constipation, ID and surgery agree that patient does not have active C Diff.  She received 3 days of Augmentin for stercoral colitis per  ID and surgery recommendation.  She is getting bowel regimen.  She is complaining of SOB, she has noticed some improvement with nebulizer treatment.  CT chest showed small left pleural effusion with compressive atelectasis of the left lower lobe, old granulomatous disease.  Troponin not significantly elevated.   Assessment & Plan:   Principal Problem:   Diverticulitis Active Problems:   Stercoral colitis   C. difficile colitis   Spleen hematoma   Type 2 diabetes mellitus with diabetic polyneuropathy, with long-term current use of insulin (HCC)   ESRD on dialysis (HCC)   Paroxysmal atrial fibrillation (HCC)   Dehiscence of amputation stump of right lower extremity  (HCC)   Anemia of chronic disease   Hypothyroidism   Prolonged QT interval   History of Clostridioides difficile colitis   Below-knee amputation of right lower extremity ([place])   Constipation  Severe Constipation:  C. difficile colitis: Ruled out  Stercoral colitis, sigmoid diverticulitis -CT: Showed large stool ball in the rectum with a scattered rectal wall pneumatosis concerning for a stercoral colitis and noted of sigmoid diverticulosis with acute diverticulitis. -General surgery consulted recommended laxative.  -ID doesn't think patient has C diff colitis.  -General Surgery now recommend antibiotics for CT finding. Patient completed Augmentin BID for 3 days and prophylaxis oral Vancomycin/ antibiotics started 1/13 for total of 10 days.  -Had BM yesterday, still feeling bloated. Plan for more laxatives.   Perisplenic collection: -CT abdomen pelvis with contrast noted 6 x 4 x 2 cm complex subcapsular collection in the superior aspect of the spleen with concern for hematoma versus infectious process. Monitor hemoglobin -Surgery, think possible spontaneous subcapsular hematoma, lower suspicious for infectious collection as there is no gas.  Continue to monitor hemoglobin.  -Will hold aspirin and DVT prophylaxis.  Patient will need  repeat CT scan prior to discharge/  Hb relatively stable 7.7   Chronic Respiratory Failure: on 3 L oxygen at home in setting HF.  Repeated chest  x ray: No active diseases.  Schedule nebulizer treatment.  Report some improvement with nebulizer treatments.  She is still SOB, CT chest small pleural effusion, troponin not significantly elevated, EKG old RBBB.   Diabetes type 2 uncontrolled with hyperglycemia Last A1c 8.9 Continue  Semglee 23 units sliding scale insulin Home regimen includes Lantus  10 units nightly, Tradjenta 5 units   ESRD, HD  M,W,F Nephrology consulted and following.    Paroxysmal A-fib: Continue amiodarone   Wound dehiscence,   status post right BKA Patient is scheduled to have revision of prior BKA due to wound dehiscence  by Dr. Sharol Given. Dr. Sharol Given has canceled procedure for 1/12. Sx schedule for Friday if patient is stable.   Anemia of chronic disease: Hb baseline 8.9 Continue to monitor  Prolonged QT: Avoid prolonged QT medications.  Hypothyroidism: Continue with Synthroid.  History of GI bleed: Patient presented to the hospital last month with GI bleed.  Ultimately underwent exam under anesthesia with oversewing of rectal ulcer found on 05/24/2022 by Dr. Rich Number.   Hyponatremia; Treat with HD   Pressure Injury 05/23/22 Buttocks Right Deep Tissue Pressure Injury - Purple or maroon localized area of discolored intact skin or blood-filled blister due to damage of underlying soft tissue from pressure and/or shear. (Active)  05/23/22 1715  Location: Buttocks  Location Orientation: Right  Staging: Deep Tissue Pressure Injury - Purple or maroon localized area of discolored intact skin or blood-filled blister due to damage of underlying soft tissue from pressure and/or shear.  Wound Description (Comments):   Present on Admission: Yes  Dressing Type Foam - Lift dressing to assess site every shift 06/10/22 1300     Pressure Injury 05/23/22 Right;Left;Posterior Stage 1 -  Intact skin with non-blanchable redness of a localized area usually over a bony prominence. (Active)  05/23/22   Location:   Location Orientation: Right;Left;Posterior  Staging: Stage 1 -  Intact skin with non-blanchable redness of a localized area usually over a bony prominence.  Wound Description (Comments):   Present on Admission: Yes  Dressing Type Foam - Lift dressing to assess site every shift 06/10/22 1300      Estimated body mass index is 33.79 kg/m as calculated from the following:   Height as of 05/26/22: '5\' 5"'$  (1.651 m).   Weight as of this encounter: 92.1 kg.   DVT prophylaxis: SCD Code Status: Full code Family  Communication: Husband at bedside 1/13 Disposition Plan:  Status is: Inpatient Remains inpatient appropriate because: management of constipation.      Consultants:  ID Surgery  Procedures:  none  Antimicrobials:    Subjective: She report having BM yesterday. Continue to have SOB on and off. Feels nebulizer help.  She denies chest pain. She wonder if SOB is just anxiety.   Objective: Vitals:   06/09/22 2036 06/10/22 0522 06/10/22 0853 06/10/22 1519  BP: (!) 111/26 (!) 127/43 (!) 112/44 (!) 110/52  Pulse: 82 78 79 81  Resp: '18 18 17 17  '$ Temp: 98.5 F (36.9 C) (!) 97.4 F (36.3 C) 97.6 F (36.4 C) 97.7 F (36.5 C)  TempSrc: Oral Oral Oral Oral  SpO2: 99% 100% 100% 100%  Weight:        Intake/Output Summary (Last 24 hours) at 06/10/2022 1603 Last data filed at 06/10/2022 0900 Gross per 24 hour  Intake 220 ml  Output --  Net 220 ml    Filed Weights   06/09/22 0546 06/09/22 0734 06/09/22 1129  Weight: 98.2 kg 93.1 kg 92.1 kg    Examination:  General exam: NAD Respiratory system: CTA Cardiovascular system:S 1, S 2 RRR Gastrointestinal system: BS present, soft, NT Central nervous system: Non focal.  Extremities: right BKA cover, left LE with edema mild redness  Data Reviewed: I have personally reviewed following labs and imaging studies  CBC: Recent Labs  Lab 06/05/22 0253 06/06/22 0533 06/07/22 0141 06/07/22 1426 06/08/22 0742 06/09/22 0332 06/10/22 0330  WBC 10.4 10.7* 9.6  --  7.5 7.4 7.0  NEUTROABS 8.6*  --   --   --   --   --   --   HGB 8.9* 8.3* 7.9* 8.7* 7.7* 8.0* 7.7*  HCT 29.1* 26.5* 26.3* 27.9* 24.8* 27.2* 25.9*  MCV 88.2 87.5 88.0  --  87.3 89.2 87.5  PLT 246 187 201  --  186 203 371    Basic Metabolic Panel: Recent Labs  Lab 06/06/22 0533 06/07/22 0141 06/07/22 0934 06/08/22 0742 06/09/22 0332 06/10/22 0330  NA 130*  131* 129* 129* 129* 128* 130*  K 3.7  3.8 4.0 3.6 4.1 4.2 3.8  CL 93*  93* 94* 94* 96* 94* 95*  CO2 '26   26 28 27 25 23 29  '$ GLUCOSE 195*  193* 135* 112* 366* 328* 343*  BUN 38*  37* 18 20 30* 37* 27*  CREATININE 2.99*  3.00* 2.04* 2.25* 3.33* 3.88* 2.30*  CALCIUM 8.1*  8.1* 7.8* 7.9* 8.0* 8.4* 8.5*  MG 2.5*  --   --   --   --   --   PHOS 2.6 1.7*  --  3.4  --  2.6    GFR: Estimated Creatinine Clearance: 24.4 mL/min (A) (by C-G formula based on SCr of 2.3 mg/dL (H)). Liver Function Tests: Recent Labs  Lab 06/05/22 0253 06/06/22 0533 06/07/22 0141 06/08/22 0742  AST 11*  --   --   --   ALT 11  --   --   --   ALKPHOS 101  --   --   --   BILITOT 0.6  --   --   --   PROT 5.8*  --   --   --   ALBUMIN 2.0* 1.6* 1.7* 1.8*    Recent Labs  Lab 06/05/22 0253  LIPASE 28    No results for input(s): "AMMONIA" in the last 168 hours. Coagulation Profile: No results for input(s): "INR", "PROTIME" in the last 168 hours. Cardiac Enzymes: No results for input(s): "CKTOTAL", "CKMB", "CKMBINDEX", "TROPONINI" in the last 168 hours. BNP (last 3 results) No results for input(s): "PROBNP" in the last 8760 hours. HbA1C: No results for input(s): "HGBA1C" in the last 72 hours. CBG: Recent Labs  Lab 06/09/22 1626 06/09/22 2038 06/10/22 0723 06/10/22 1123 06/10/22 1541  GLUCAP 230* 235* 229* 147* 180*    Lipid Profile: No results for input(s): "CHOL", "HDL", "LDLCALC", "TRIG", "CHOLHDL", "LDLDIRECT" in the last 72 hours. Thyroid Function Tests: No results for input(s): "TSH", "T4TOTAL", "FREET4", "T3FREE", "THYROIDAB" in the last 72 hours. Anemia Panel: No results for input(s): "VITAMINB12", "FOLATE", "FERRITIN", "TIBC", "IRON", "RETICCTPCT" in the last 72 hours. Sepsis Labs: No results for input(s): "PROCALCITON", "LATICACIDVEN" in the last 168 hours.  Recent Results (from the past 240 hour(s))  C Difficile Quick Screen w PCR reflex     Status: Abnormal   Collection Time: 06/05/22  9:03 AM   Specimen: STOOL  Result Value Ref Range Status   C Diff antigen POSITIVE (A) NEGATIVE  Final   C Diff toxin NEGATIVE NEGATIVE Final   C Diff interpretation Results are indeterminate. See PCR results.  Final    Comment: Performed at [facility], [place] [address] [place], [place] [address]  C. Diff by PCR, Reflexed     Status: Abnormal   Collection Time: 06/05/22  9:03 AM  Result Value Ref  Range Status   Toxigenic C. Difficile by PCR POSITIVE (A) NEGATIVE Final    Comment: Positive for toxigenic C. difficile with little to no toxin production. Only treat if clinical presentation suggests symptomatic illness. Performed at [facility], [place] [address] [place], [place] [address]   Blood culture (routine x 2)     Status: None   Collection Time: 06/05/22  9:21 AM   Specimen: BLOOD RIGHT ARM  Result Value Ref Range Status   Specimen Description BLOOD RIGHT ARM  Final   Special Requests   Final    BOTTLES DRAWN AEROBIC AND ANAEROBIC Blood Culture results may not be optimal due to an inadequate volume of blood received in culture bottles   Culture   Final    NO GROWTH 5 DAYS Performed at [facility], [place] [address], [place], [place] [address]    Report Status 06/10/2022 FINAL  Final         Radiology Studies: CT CHEST WO CONTRAST  Result Date: 06/10/2022 CLINICAL DATA:  Chronic dyspnea, chest wall or pleural disease suspected EXAM: CT CHEST WITHOUT CONTRAST TECHNIQUE: Multidetector CT imaging of the chest was performed following the standard protocol without IV contrast. RADIATION DOSE REDUCTION: This exam was performed according to the departmental dose-optimization program which includes automated exposure control, adjustment of the mA and/or kV according to patient size and/or use of iterative reconstruction technique. COMPARISON:  12/10/2005 FINDINGS: Cardiovascular: RIGHT jugular line with tip at cavoatrial junction. Extensive atherosclerotic calcifications aorta, proximal great vessels and coronary arteries. Aorta normal caliber. Mild enlargement of  cardiac chambers. No pericardial effusion. Mediastinum/Nodes: Esophagus unremarkable. Elongated RIGHT thyroid lobe. Enlarged precarinal lymph node 14 mm image 54. Calcified subcarinal and RIGHT hilar nodes. Lungs/Pleura: Small LEFT pleural effusion. Compressive atelectasis LEFT lower lobe. Remaining lungs clear. No pulmonary infiltrate or pneumothorax. Calcified granuloma anteromedial RIGHT upper lobe. Upper Abdomen: Extensive atherosclerotic calcifications throughout celiac axis. Remaining visualized upper abdomen unremarkable. Musculoskeletal: No acute osseous findings. IMPRESSION: Small LEFT pleural effusion with compressive atelectasis of LEFT lower lobe. Old granulomatous disease. Extensive atherosclerotic calcifications including coronary arteries. Single nonspecific mildly enlarged precarinal lymph node 14 mm diameter. Aortic Atherosclerosis (ICD10-I70.0). Electronically Signed   By: Lavonia Dana M.D.   On: 06/10/2022 13:49   DG Abd Portable 1V  Result Date: 06/10/2022 CLINICAL DATA:  Constipation EXAM: PORTABLE ABDOMEN - 1 VIEW COMPARISON:  X-ray 06/09/2022.  CT 06/05/2022 and older FINDINGS: Underpenetrated portable x-ray. Gas is seen in nondilated loops of small large bowel. Scattered mild colonic stool but less overall than previous. No frank obstruction. No obvious free air on this portable supine radiograph. Overlapping cardiac leads. IMPRESSION: Improving colonic stool.  Nonspecific bowel gas pattern Electronically Signed   By: Jill Side M.D.   On: 06/10/2022 12:25   DG Abd 1 View  Result Date: 06/09/2022 CLINICAL DATA:  Constipation.  Diverticulitis. EXAM: ABDOMEN - 1 VIEW COMPARISON:  08/15/2020 bowel FINDINGS: Breast pattern is nonobstructive. There is no free intraperitoneal air. No evidence for organomegaly. Moderate stool burden. Degenerative changes are seen in the spine. IMPRESSION: Nonobstructive bowel gas pattern. Moderate stool burden. Electronically Signed   By: Nolon Nations  M.D.   On: 06/09/2022 07:21        Scheduled Meds:  (feeding supplement) PROSource Plus  30 mL Oral BID BM   amiodarone  200 mg Oral Daily   Chlorhexidine Gluconate Cloth  6 each Topical Q0600   docusate sodium  100 mg Oral BID  Gerhardt's butt cream   Topical TID   guaiFENesin  600 mg Oral BID   insulin aspart  0-15 Units Subcutaneous TID WC   insulin aspart  0-5 Units Subcutaneous QHS   insulin aspart  3 Units Subcutaneous TID WC   insulin glargine-yfgn  23 Units Subcutaneous QHS   levothyroxine  50 mcg Oral QAC breakfast   midodrine  10 mg Oral Q M,W,F-HD   midodrine  5 mg Oral TID WC   multivitamin  1 tablet Oral Daily   pantoprazole  40 mg Oral Daily   pregabalin  75 mg Oral Daily   saccharomyces boulardii  500 mg Oral BID   senna  1 tablet Oral Daily   sodium chloride flush  3 mL Intravenous Q12H   vancomycin  125 mg Oral QID   vortioxetine HBr  20 mg Oral q AM   Continuous Infusions:     LOS: 5 days    Time spent: 35 minutes.    Elmarie Shiley, MD [facility]   If 7PM-7AM, please contact night-coverage [email or web address]  06/10/2022, 4:03 PM

## 2022-06-10 NOTE — Progress Notes (Signed)
[facility] Progress Note   Subjective:    Got nauseated during dialysis yesterday -happens most treatments. Tolerated 3L UF. Had a BM overnight but still with abd discomfort   Objective Vitals:   06/09/22 1624 06/09/22 2036 06/10/22 0522 06/10/22 0853  BP: (!) 116/57 (!) 111/26 (!) 127/43 (!) 112/44  Pulse: 83 82 78 79  Resp: '18 18 18 17  '$ Temp: 97.9 F (36.6 C) 98.5 F (36.9 C) (!) 97.4 F (36.3 C) 97.6 F (36.4 C)  TempSrc: Oral Oral Oral Oral  SpO2: 99% 99% 100% 100%  Weight:       Physical Exam General: Awake, alert, NAD Heart: S1 and S2; No murmurs, gallops, or rubs Lungs: clear anteriorly Abdomen: Tender lower abd quadrants Extremities:R BKA; 1+ edema LLE with excorations and rubor Dialysis Access: RIJ [facility]   Filed Weights   06/09/22 0546 06/09/22 0734 06/09/22 1129  Weight: 98.2 kg 93.1 kg 92.1 kg    Intake/Output Summary (Last 24 hours) at 06/10/2022 1020 Last data filed at 06/10/2022 0900 Gross per 24 hour  Intake 620 ml  Output 3000 ml  Net -2380 ml     Additional Objective Labs: Basic Metabolic Panel: Recent Labs  Lab 06/07/22 0141 06/07/22 0934 06/08/22 0742 06/09/22 0332 06/10/22 0330  NA 129*   < > 129* 128* 130*  K 4.0   < > 4.1 4.2 3.8  CL 94*   < > 96* 94* 95*  CO2 28   < > '25 23 29  '$ GLUCOSE 135*   < > 366* 328* 343*  BUN 18   < > 30* 37* 27*  CREATININE 2.04*   < > 3.33* 3.88* 2.30*  CALCIUM 7.8*   < > 8.0* 8.4* 8.5*  PHOS 1.7*  --  3.4  --  2.6   < > = values in this interval not displayed.    Liver Function Tests: Recent Labs  Lab 06/05/22 0253 06/06/22 0533 06/07/22 0141 06/08/22 0742  AST 11*  --   --   --   ALT 11  --   --   --   ALKPHOS 101  --   --   --   BILITOT 0.6  --   --   --   PROT 5.8*  --   --   --   ALBUMIN 2.0* 1.6* 1.7* 1.8*    Recent Labs  Lab 06/05/22 0253  LIPASE 28    CBC: Recent Labs  Lab 06/05/22 0253 06/06/22 0533 06/07/22 0141 06/07/22 1426 06/08/22 0742  06/09/22 0332 06/10/22 0330  WBC 10.4 10.7* 9.6  --  7.5 7.4 7.0  NEUTROABS 8.6*  --   --   --   --   --   --   HGB 8.9* 8.3* 7.9*   < > 7.7* 8.0* 7.7*  HCT 29.1* 26.5* 26.3*   < > 24.8* 27.2* 25.9*  MCV 88.2 87.5 88.0  --  87.3 89.2 87.5  PLT 246 187 201  --  186 203 199   < > = values in this interval not displayed.    Blood Culture    Component Value Date/Time   SDES BLOOD RIGHT ARM 06/05/2022 0921   SPECREQUEST  06/05/2022 6712    BOTTLES DRAWN AEROBIC AND ANAEROBIC Blood Culture results may not be optimal due to an inadequate volume of blood received in culture bottles   CULT  06/05/2022 0921    NO GROWTH 4 DAYS Performed at [facility], [address] [place], [place] [address]    REPTSTATUS PENDING 06/05/2022 9798    Cardiac Enzymes: No results for input(s): "CKTOTAL", "CKMB", "CKMBINDEX", "TROPONINI" in the last 168 hours. CBG: Recent Labs  Lab 06/09/22 0702 06/09/22 1208 06/09/22 1626 06/09/22 2038 06/10/22 0723  GLUCAP 273* 228* 230* 235* 229*    Iron Studies: No results for input(s): "IRON", "TIBC", "TRANSFERRIN", "FERRITIN" in the last 72 hours. Lab Results  Component Value Date   INR 1.4 (H) 05/22/2022   INR 1.5 (H) 02/01/2022   INR 1.3 (H) 01/14/2022   Studies/Results: DG Abd 1 View  Result Date: 06/09/2022 CLINICAL DATA:  Constipation.  Diverticulitis. EXAM: ABDOMEN - 1 VIEW COMPARISON:  08/15/2020 bowel FINDINGS: Breast pattern is nonobstructive. There is no free intraperitoneal air. No evidence for organomegaly. Moderate stool burden. Degenerative changes are seen in the spine. IMPRESSION: Nonobstructive bowel gas pattern. Moderate stool burden. Electronically Signed   By: Nolon Nations M.D.   On: 06/09/2022 07:21    Medications:   (feeding supplement) PROSource Plus  30 mL Oral BID BM   amiodarone  200 mg Oral Daily   Chlorhexidine Gluconate Cloth  6 each Topical Q0600   docusate sodium  100 mg Oral BID   Gerhardt's butt cream    Topical TID   insulin aspart  0-15 Units Subcutaneous TID WC   insulin aspart  0-5 Units Subcutaneous QHS   insulin aspart  3 Units Subcutaneous TID WC   insulin glargine-yfgn  23 Units Subcutaneous QHS   levothyroxine  50 mcg Oral QAC breakfast   midodrine  10 mg Oral Q M,W,F-HD   midodrine  5 mg Oral TID WC   multivitamin  1 tablet Oral Daily   pantoprazole  40 mg Oral Daily   polyethylene glycol  17 g Oral BID   pregabalin  75 mg Oral Daily   saccharomyces boulardii  500 mg Oral BID   senna  1 tablet Oral Daily   sodium chloride flush  3 mL Intravenous Q12H   vancomycin  125 mg Oral QID   vortioxetine HBr  20 mg Oral q AM    Dialysis Orders: SW MWF  4h  400/800   97kg   3K/2.5Ca bath  TDC  Hep none - last HD 1/10, post wt 98.8 - mircera 150 mcg q2, last 1/08, due 1/22  Assessment/Plan: Abd pain/ stercoral proctitis w/o evidence of perfortaion/ acute diverticulitis - per abd CT. Hx of recent Cdif infection. Po vanc started for suspected Cdif, gen surg and ID consulted. On Augmentin R BKA Dehiscence - Followed by Dr. Sharol Given: plan for tentative surgery on Friday 1/19 but need to resolve current medical issues first. DM2 - uncont, per pmd ESRD - on HD MWF. Next HD 06/11/21.  On midodrine HTN/ volume - sig LE / hip edema on examm, no resp issues. BP's are soft at admit but now stable. Get a good bed weight.  Continue max UF as tolerated.  Anemia esrd - Hb now 7.7, next ESA due on Jan 22. Transfuse prn.  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 Holding binders for now. Not on vdra. Monitor trend.  Albumin 1.7, add prosource PAF - no longer on a/c due to hx of GIB H/o PAD - sp R BKA Recent bleeding rectal ulcer - rx'd surgically dec 2023  Lynnda Child PA-C [place] 06/10/2022,10:20 AM

## 2022-06-10 NOTE — Inpatient Diabetes Management (Signed)
Inpatient Diabetes Program Recommendations  AACE/ADA: New Consensus Statement on Inpatient Glycemic Control (2015)  Target Ranges:  Prepandial:   less than 140 mg/dL      Peak postprandial:   less than 180 mg/dL (1-2 hours)      Critically ill patients:  140 - 180 mg/dL   Lab Results  Component Value Date   GLUCAP 229 (H) 06/10/2022   HGBA1C 8.9 (H) 05/02/2022    Review of Glycemic Control  Latest Reference Range & Units 06/09/22 07:02 06/09/22 12:08 06/09/22 16:26 06/09/22 20:38 06/10/22 07:23  Glucose-Capillary 70 - 99 mg/dL 273 (H) 228 (H) 230 (H) 235 (H) 229 (H)   Diabetes history: DM  Outpatient Diabetes medications:  Omnipod insulin pump (total basal 25.9 units/day, Carb coverage 1:12 grams, Sensitivity factor 1:40 mg/dl ) not on insulin pump at SNF Current orders for Inpatient glycemic control:   Semglee 23 units qhs Novolog 0-15 units tid + hs Novolog 3 units tid meal coverage  A1c 8.9% on 12/8  Inpatient Diabetes Program Recommendations:    -   Increase Semglee to 25 units  -   Increase Novolog meal coverage to 5 units tid if eating >50% of meals   Thanks,  Tama Headings RN, MSN, BC-ADM Inpatient Diabetes Coordinator Team Pager [phone] (8a-5p)

## 2022-06-11 ENCOUNTER — Inpatient Hospital Stay (HOSPITAL_COMMUNITY): Payer: PPO

## 2022-06-11 DIAGNOSIS — K5792 Diverticulitis of intestine, part unspecified, without perforation or abscess without bleeding: Secondary | ICD-10-CM | POA: Diagnosis not present

## 2022-06-11 LAB — BLOOD GAS, ARTERIAL
Acid-Base Excess: 3.4 mmol/L — ABNORMAL HIGH (ref 0.0–2.0)
Bicarbonate: 29.6 mmol/L — ABNORMAL HIGH (ref 20.0–28.0)
O2 Saturation: 98.4 %
Patient temperature: 36.5
pCO2 arterial: 49 mmHg — ABNORMAL HIGH (ref 32–48)
pH, Arterial: 7.39 (ref 7.35–7.45)
pO2, Arterial: 110 mmHg — ABNORMAL HIGH (ref 83–108)

## 2022-06-11 LAB — GLUCOSE, CAPILLARY
Glucose-Capillary: 223 mg/dL — ABNORMAL HIGH (ref 70–99)
Glucose-Capillary: 313 mg/dL — ABNORMAL HIGH (ref 70–99)
Glucose-Capillary: 310 mg/dL — ABNORMAL HIGH (ref 70–99)

## 2022-06-11 MED ORDER — INSULIN ASPART 100 UNIT/ML IJ SOLN
5.0000 [IU] | Freq: Three times a day (TID) | INTRAMUSCULAR | Status: DC
  Administered 2022-06-11 – 2022-06-12 (×4): 5 [IU] via SUBCUTANEOUS

## 2022-06-11 MED ORDER — HEPARIN SODIUM (PORCINE) 1000 UNIT/ML IJ SOLN
INTRAMUSCULAR | Status: AC
  Administered 2022-06-11: 3800 [IU]
  Filled 2022-06-11: qty 4

## 2022-06-11 MED ORDER — INSULIN GLARGINE-YFGN 100 UNIT/ML ~~LOC~~ SOLN
25.0000 [IU] | Freq: Every day | SUBCUTANEOUS | Status: DC
  Administered 2022-06-11: 25 [IU] via SUBCUTANEOUS
  Filled 2022-06-11 (×2): qty 0.25

## 2022-06-11 NOTE — Progress Notes (Addendum)
POST OPERATIVE NOTE    CC:  F/u for surgery  HPI:   72 year old female who recently underwent a left arm first stage brachiobasilic arteriovenous fistula on 04/02/22 by Dr. Trula Slade.  She was noted to have infection of her previous incision.  S/P Incision and drainage left arm AV fistula with drain placement  05/05/22.  Pt returns today for follow up.  Pt states no recurrent edema or erythema at the left [facility] incisional area.  She denise symptoms of steal in the left UE.  ESRD currently on HD via right IJ TDC. HD TTS.  She was cared for at [facility] and returned to the ED 06/05/21 with CC of abdominal pain.  She is also being followed by Dr. Sharol Given while admitted for non healing right BKA.   She missed her office appoint due to being hospitalized.  Her incision is well healed and we have been asked to remove left UE AC sutures s/p vascular surgery.   Allergies  Allergen Reactions   Cephalexin Diarrhea and Other (See Comments)   Codeine Nausea And Vomiting and Other (See Comments)    Current Facility-Administered Medications  Medication Dose Route Frequency Provider Last Rate Last Admin   (feeding supplement) PROSource Plus liquid 30 mL  30 mL Oral BID BM Madelon Lips, MD   30 mL at 06/10/22 1436   acetaminophen (TYLENOL) tablet 650 mg  650 mg Oral Q6H PRN [name] Plan A, MD   650 mg at 06/05/22 1343   Or   acetaminophen (TYLENOL) suppository 650 mg  650 mg Rectal Q6H PRN [name] Plan A, MD       albuterol (PROVENTIL) (2.5 MG/3ML) 0.083% nebulizer solution 2.5 mg  2.5 mg Nebulization Q6H PRN [name] Plan A, MD   2.5 mg at 06/09/22 1655   amiodarone (PACERONE) tablet 200 mg  200 mg Oral Daily Tamala Julian, Rondell A, MD   200 mg at 06/10/22 0851   Chlorhexidine Gluconate Cloth 2 % PADS 6 each  6 each Topical Q0600 Roney Jaffe, MD   6 each at 06/10/22 0554   docusate sodium (COLACE) capsule 100 mg  100 mg Oral BID Jill Alexanders, PA-C   100 mg at 06/10/22 2104   fentaNYL  (SUBLIMAZE) injection 25 mcg  25 mcg Intravenous Q2H PRN Regalado, Belkys A, MD       Gerhardt's butt cream   Topical TID Jill Alexanders, PA-C   Given at 06/11/22 0815   guaiFENesin (MUCINEX) 12 hr tablet 600 mg  600 mg Oral BID Regalado, Belkys A, MD   600 mg at 06/10/22 2246   guaiFENesin (ROBITUSSIN) 100 MG/5ML liquid 5 mL  5 mL Oral Q4H PRN Opyd, Ilene Qua, MD   5 mL at 06/11/22 0753   hydrALAZINE (APRESOLINE) tablet 25 mg  25 mg Oral Q6H PRN [name] Plan A, MD       insulin aspart (novoLOG) injection 0-15 Units  0-15 Units Subcutaneous TID WC Tamala Julian, Rondell A, MD   5 Units at 06/11/22 0755   insulin aspart (novoLOG) injection 0-5 Units  0-5 Units Subcutaneous QHS [name] Plan A, MD   3 Units at 06/10/22 2251   insulin aspart (novoLOG) injection 5 Units  5 Units Subcutaneous TID WC [place] Eric J, DO   5 Units at 06/11/22 0755   insulin glargine-yfgn (SEMGLEE) injection 25 Units  25 Units Subcutaneous QHS [place] Eric J, DO       levothyroxine (SYNTHROID) tablet 50 mcg  50 mcg Oral QAC  breakfast [name] Plan A, MD   50 mcg at 06/11/22 0519   midodrine (PROAMATINE) tablet 10 mg  10 mg Oral Q M,W,F-HD [name] Plan A, MD   10 mg at 06/09/22 0725   midodrine (PROAMATINE) tablet 5 mg  5 mg Oral TID WC Smith, Rondell A, MD   5 mg at 06/11/22 0753   multivitamin (RENA-VIT) tablet 1 tablet  1 tablet Oral Daily [name] Plan A, MD   1 tablet at 06/10/22 0849   oxyCODONE-acetaminophen (PERCOCET/ROXICET) 5-325 MG per tablet 1 tablet  1 tablet Oral Q4H PRN [name] Plan A, MD   1 tablet at 06/11/22 0753   pantoprazole (PROTONIX) EC tablet 40 mg  40 mg Oral Daily Tamala Julian, Rondell A, MD   40 mg at 06/10/22 0849   pregabalin (LYRICA) capsule 75 mg  75 mg Oral Daily Tamala Julian, Rondell A, MD   75 mg at 06/10/22 0850   saccharomyces boulardii (FLORASTOR) capsule 500 mg  500 mg Oral BID [name] Plan A, MD   500 mg at 06/10/22 2103   senna (SENOKOT) tablet 8.6 mg  1 tablet Oral Daily Regalado, Belkys  A, MD   8.6 mg at 06/10/22 0849   sodium chloride flush (NS) 0.9 % injection 3 mL  3 mL Intravenous Q12H Smith, Rondell A, MD   3 mL at 06/10/22 2252   trimethobenzamide (TIGAN) injection 200 mg  200 mg Intramuscular Q6H PRN [name] Plan A, MD       vancomycin (VANCOCIN) capsule 125 mg  125 mg Oral QID Regalado, Belkys A, MD   125 mg at 06/10/22 2104   vortioxetine HBr (TRINTELLIX) tablet 20 mg  20 mg Oral q AM [name] Plan A, MD   20 mg at 06/10/22 0850     ROS:  See HPI  Physical Exam:    Incision:  well healed nylon sutures were removed, patient tolerated this well. Extremities:  left UE sensation, motor and palpable pulse intact.  No signs of steal.     Assessment/Plan:  This is a 72 y.o. female who is s/p:first stage basilic fistula creation.  Followed by irrigation and debridement of seroma at the [facility] incision.  Sutures removed, no recurrent seroma or erythema.    I will order fistula duplex to plan second stage basilic in the future.       Roxy Horseman PA-C [facility] [phone]  I agree with the above.  We will check a AVF duplex to see if her first stage BVT is ready for the second stage procedure  Wells Sada Mazzoni

## 2022-06-11 NOTE — Progress Notes (Signed)
Received patient in bed to unit.  Alert and oriented.  Informed consent signed and in chart.   Treatment initiated: 1059 Treatment completed: 1430  Patient tolerated well.  Transported back to the room  Alert, without acute distress.  Hand-off given to patient's nurse.   Access used: Catheter  Access issues:none  Total UF removed: 3L Medication(s) given: None Post HD VS: 123/40,,84,100%,97.5,10 Post HD weight: 90.8kg   [facility]

## 2022-06-11 NOTE — Progress Notes (Addendum)
[facility] Progress Note     Subjective: CC-  Seen in dialysis.  Having some increased abdominal pain after drinking all the miralax yesterday. Some nausea, no emesis. Tolerating diet. She has had multiple loose stools since yesterday (2-3 yesterday) and 1 large bowel movement today. VSS  Objective: Vital signs in last 24 hours: Temp:  [97.7 F (36.5 C)-98.2 F (36.8 C)] 97.7 F (36.5 C) (01/17 1051) Pulse Rate:  [79-89] 85 (01/17 1059) Resp:  [13-18] 14 (01/17 1059) BP: (92-135)/(37-64) 113/50 (01/17 1059) SpO2:  [100 %] 100 % (01/17 1059) Weight:  [93.3 kg] 93.3 kg (01/17 1051) Last BM Date : 06/10/22  Intake/Output from previous day: 01/16 0701 - 01/17 0700 In: 220 [P.O.:220] Out: -  Intake/Output this shift: Total I/O In: 220 [P.O.:220] Out: -   PE: Gen:  Alert, NAD, pleasant Card:  Regular rate and rhythm, LLE edema present  Pulm:  Normal effort on nasal cannula  Abd: Soft,  some distension, global tenderness without rebound or guarding/ no peritonitis  Psych: A&Ox3   Lab Results:  Recent Labs    06/09/22 0332 06/10/22 0330  WBC 7.4 7.0  HGB 8.0* 7.7*  HCT 27.2* 25.9*  PLT 203 199   BMET Recent Labs    06/09/22 0332 06/10/22 0330  NA 128* 130*  K 4.2 3.8  CL 94* 95*  CO2 23 29  GLUCOSE 328* 343*  BUN 37* 27*  CREATININE 3.88* 2.30*  CALCIUM 8.4* 8.5*   PT/INR No results for input(s): "LABPROT", "INR" in the last 72 hours. CMP     Component Value Date/Time   NA 130 (L) 06/10/2022 0330   K 3.8 06/10/2022 0330   CL 95 (L) 06/10/2022 0330   CO2 29 06/10/2022 0330   GLUCOSE 343 (H) 06/10/2022 0330   GLUCOSE >444 (H) 06/07/2015 1311   BUN 27 (H) 06/10/2022 0330   CREATININE 2.30 (H) 06/10/2022 0330   CREATININE 6.22 (H) 03/26/2022 1620   CALCIUM 8.5 (L) 06/10/2022 0330   PROT 5.8 (L) 06/05/2022 0253   ALBUMIN 1.8 (L) 06/08/2022 0742   AST 11 (L) 06/05/2022 0253   ALT 11 06/05/2022 0253   ALKPHOS 101 06/05/2022 0253    BILITOT 0.6 06/05/2022 0253   GFRNONAA 22 (L) 06/10/2022 0330   GFRNONAA 11 (L) 11/15/2020 1452   GFRAA 13 (L) 11/15/2020 1452   Lipase     Component Value Date/Time   LIPASE 28 06/05/2022 0253       Studies/Results: CT CHEST WO CONTRAST  Result Date: 06/10/2022 CLINICAL DATA:  Chronic dyspnea, chest wall or pleural disease suspected EXAM: CT CHEST WITHOUT CONTRAST TECHNIQUE: Multidetector CT imaging of the chest was performed following the standard protocol without IV contrast. RADIATION DOSE REDUCTION: This exam was performed according to the departmental dose-optimization program which includes automated exposure control, adjustment of the mA and/or kV according to patient size and/or use of iterative reconstruction technique. COMPARISON:  12/10/2005 FINDINGS: Cardiovascular: RIGHT jugular line with tip at cavoatrial junction. Extensive atherosclerotic calcifications aorta, proximal great vessels and coronary arteries. Aorta normal caliber. Mild enlargement of cardiac chambers. No pericardial effusion. Mediastinum/Nodes: Esophagus unremarkable. Elongated RIGHT thyroid lobe. Enlarged precarinal lymph node 14 mm image 54. Calcified subcarinal and RIGHT hilar nodes. Lungs/Pleura: Small LEFT pleural effusion. Compressive atelectasis LEFT lower lobe. Remaining lungs clear. No pulmonary infiltrate or pneumothorax. Calcified granuloma anteromedial RIGHT upper lobe. Upper Abdomen: Extensive atherosclerotic calcifications throughout celiac axis. Remaining visualized upper abdomen unremarkable. Musculoskeletal: No acute osseous findings. IMPRESSION:  Small LEFT pleural effusion with compressive atelectasis of LEFT lower lobe. Old granulomatous disease. Extensive atherosclerotic calcifications including coronary arteries. Single nonspecific mildly enlarged precarinal lymph node 14 mm diameter. Aortic Atherosclerosis (ICD10-I70.0). Electronically Signed   By: Lavonia Dana M.D.   On: 06/10/2022 13:49   DG Abd  Portable 1V  Result Date: 06/10/2022 CLINICAL DATA:  Constipation EXAM: PORTABLE ABDOMEN - 1 VIEW COMPARISON:  X-ray 06/09/2022.  CT 06/05/2022 and older FINDINGS: Underpenetrated portable x-ray. Gas is seen in nondilated loops of small large bowel. Scattered mild colonic stool but less overall than previous. No frank obstruction. No obvious free air on this portable supine radiograph. Overlapping cardiac leads. IMPRESSION: Improving colonic stool.  Nonspecific bowel gas pattern Electronically Signed   By: Jill Side M.D.   On: 06/10/2022 12:25    Anti-infectives: Anti-infectives (From admission, onward)    Start     Dose/Rate Route Frequency Ordered Stop   07/12/22 1000  vancomycin (VANCOCIN) capsule 125 mg  Status:  Discontinued       See Hyperspace for full Linked Orders Report.   125 mg Oral Every 3 DAYS 06/05/22 2301 06/06/22 1030   07/04/22 1000  vancomycin (VANCOCIN) capsule 125 mg  Status:  Discontinued       See Hyperspace for full Linked Orders Report.   125 mg Oral Every other day 06/05/22 2301 06/06/22 1030   06/27/22 1000  vancomycin (VANCOCIN) capsule 125 mg  Status:  Discontinued       See Hyperspace for full Linked Orders Report.   125 mg Oral Daily 06/05/22 2301 06/06/22 1030   06/19/22 2200  vancomycin (VANCOCIN) capsule 125 mg  Status:  Discontinued       See Hyperspace for full Linked Orders Report.   125 mg Oral 2 times daily 06/05/22 2301 06/06/22 1030   06/07/22 1445  amoxicillin-clavulanate (AUGMENTIN) 875-125 MG per tablet 1 tablet  Status:  Discontinued        1 tablet Oral Every 12 hours 06/07/22 1349 06/07/22 1351   06/07/22 1445  vancomycin (VANCOCIN) capsule 125 mg        125 mg Oral 4 times daily 06/07/22 1350 06/17/22 1359   06/07/22 1445  amoxicillin-clavulanate (AUGMENTIN) 500-125 MG per tablet 1 tablet        1 tablet Oral Every 12 hours 06/07/22 1351 06/09/22 2212   06/06/22 0000  vancomycin (VANCOCIN) capsule 125 mg  Status:  Discontinued       See  Hyperspace for full Linked Orders Report.   125 mg Oral 4 times daily 06/05/22 2301 06/06/22 1030   06/05/22 1000  piperacillin-tazobactam (ZOSYN) IVPB 2.25 g  Status:  Discontinued        2.25 g 100 mL/hr over 30 Minutes Intravenous Every 8 hours 06/05/22 0931 06/06/22 1042   06/05/22 0930  metroNIDAZOLE (FLAGYL) IVPB 500 mg  Status:  Discontinued        500 mg 100 mL/hr over 60 Minutes Intravenous Every 12 hours 06/05/22 0925 06/05/22 0929   06/05/22 0915  metroNIDAZOLE (FLAGYL) IVPB 500 mg  Status:  Discontinued        500 mg 100 mL/hr over 60 Minutes Intravenous  Once 06/05/22 0900 06/05/22 1043   06/05/22 0915  piperacillin-tazobactam (ZOSYN) IVPB 3.375 g  Status:  Discontinued        3.375 g 12.5 mL/hr over 240 Minutes Intravenous Every 8 hours 06/05/22 0901 06/05/22 0926        Assessment/Plan 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 Tolerated miralax yesterday and has had multiple Bms.  - no emergent surgical needs. Hold on any further laxatives today given increased pain with miralax prep yesterday. If remains constipated could consider disimpaction in the OR Friday when she goes for surgery with Dr. Sharol Given, but seems to be improving. Repeat film in the morning. - NOTHING PER RECTUM to avoid further irritation and recurrent rectal bleeding.    Perisplenic collection - unclear etiology; possible spontaneous subcapsular hematoma, despite over two weeks off of DOAC. Lower suspicion for infectious collection as there is no gas in the collection. Monitor H&H and abd exam.      ?c.dif colitis - I think test is positive because of her previous episode, no evidence of acute c.dif. 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    LOS: 6 days    [facility] 06/11/2022, 11:19 AM Please see Amion for pager number during day hours 7:00am-4:30pm

## 2022-06-11 NOTE — Progress Notes (Signed)
PROGRESS NOTE    Susan [name]  OHY:[id number] DOB: 26-Dec-1950 DOA: 06/05/2022 PCP: [name], MD    Brief Narrative:   Susan [name] is a 72 y.o. female with past medical history significant for HTN, HLD, proximal atrial fibrillation on anticoagulation, chronic diastolic CHF, CAD, ESRD on HD MWF, chronic respiratory failure on O2, DM2, hypothyroidism, history of GI bleed, history of recurrent C. difficile colitis who presented to [facility] ED on 1/11 with complaints of lower abdominal pain associate with nausea/vomiting.  Evaluation in the ED notable for severe constipation with stercoral colitis.  Patient also positive for C. difficile but likely colonization per recommendations of ID.  Patient was started on aggressive bowel regimen.  Patient received 3 days of Augmentin for sterile coral colitis and currently on 10-day course of oral vancomycin for prophylaxis for C. difficile.  Patient was also seen by orthopedics, Dr. 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  Recent admission from 12/28 until 1 8//2024 for acute lower GI bleed. GI tried to perform flexible sigmoidoscopy x 2 which were unsuccessful. General surgery was consulted and patient underwent exam under anesthesia with oversewing of rectal ulcer found on 05/24/2022 by Dr. Rich Number. She had a total of 4 units of packed red blood cell transfusion during that hospitalization and Eliquis was recommended to be discontinued.   Assessment & Plan:   Severe constipation Stecoral colitis, sigmoid diverticulitis C. difficile colitis, ruled out Patient to ED with abdominal pain.  CT Abdo/pelvis notable for large stool ball within the rectum with scattered rectal wall pneumatosis concerning for stercoral colitis, sigmoid diverticulosis with acute diverticulitis.  C. difficile was positive, seen by ID who does not think this has active C. difficile colitis.  General surgery was consulted and recommended antibiotics for CT findings.   Patient completed 3-day course of Augmentin. -- General surgery following, appreciate assistance -- Continue oral vancomycin for 10 days for prophylaxis for C. Difficile -- Colace 100 g p.o. twice daily -- Senna daily -- General surgery considering disimpaction in the OR Friday if remains constipated -- Continue to monitor bowel movements closely, 2 reported over the last 24 hours  Right BKA with wound dehiscence -- Orthopedics following, appreciate assistance -- Plan revision on 1/19  Perisplenic collection CT abdomen/pelvis with contrast noted 6 x 4 x 2 cm complex subcapsular collection superior aspect of the spleen with concern for hematoma versus infectious process.  Seen by general surgery, believe possible spontaneous subcapsular hematoma.  Has been off of anticoagulation for greater than 2 weeks.  Low suspicion for infectious collection given no gas.  Hemoglobin stable.  -- Continue to hold aspirin, DVT prophylaxis -- Will need repeat CT scan prior to discharge  Hx infected left AV fistula Patient underwent I&D by vascular surgery, Dr. Carlis Abbott on 05/05/2022.  Patient remains with sutures in place. -- Vascular surgery consulted for consideration of suture removal  Chronic respiratory failure, O2 dependent CT chest without contrast 1/16 with small left pleural effusion, compressive atelectasis left lower lobe, old granulomatous disease. -- Continue supplemental oxygen, maintain SpO2 greater than 92%, on 3 L nasal cannula which is her baseline -- Albuterol nebs as needed for shortness of breath/wheezing  Orthostatic hypotension/autonomic dysfunction Hx essential hypertension -- Midodrine 5 mg p.o. 3 times daily -- Midodrine 10 mg p.o. daily on Monday/Wednesday/Fridays with HD  DM2 Hemoglobin A1c 8.9 on 05/02/2022, not well-controlled.  Home regimen includes Lantus 10 units nightly, NovoLog sliding scale, Tradjenta. -- Semglee 25 units [place]  qHS -- NovoLog 5 units 3 times daily AC --  Moderate SSI for coverage -- CBGs qAC/HS  Hypothyroidism -- Levothyroxine 50 mcg p.o. daily  Paroxysmal atrial fibrillation -- Amiodarone 200 mg p.o. daily -- No on anticoagulation due to rectal ulcer as above  Depression -- Trintellix 20 mg p.o. every morning  DVT prophylaxis: Place and maintain sequential compression device Start: 06/06/22 0808    Code Status: Full Code Family Communication: No family present at bedside this morning  Disposition Plan:  Level of care: Telemetry Medical Status is: Inpatient Remains inpatient appropriate because: Pending revision of right BKA planned for Friday    Consultants:  General surgery Infection disease Orthopedics, Dr. Sharol Given Nephrology Vascular surgery  Procedures:  none  Antimicrobials:  Augmentin 1/13 - 1/15 Zosyn 1/11 - 1/12 Metronidazole 1/11 - 1/11 Oral vancomycin 1/11>>   Subjective: Patient seen examined bedside, resting comfortably.  Lying in bed.  No specific complaints this morning.  Reports she just finished drinking her MiraLAX.  2 bowel movements reported yesterday, she reports 1 small 1 this morning.  Awaiting for surgical intervention planned for Friday for right BKA stump revision.  No other specific questions or concerns at this time.  Denies headache, no dizziness, no chest pain, no palpitations, no current shortness of breath, no abdominal pain, no fever/chills/night sweats, no nausea/vomiting/diarrhea, no focal weakness, no fatigue, no paresthesias.  No acute events overnight per nursing staff.  Objective: Vitals:   06/10/22 1519 06/10/22 2100 06/11/22 0501 06/11/22 0837  BP: (!) 110/52 121/64 (!) 135/56 (!) 100/37  Pulse: 81 79 80 84  Resp: '17 18 18 16  '$ Temp: 97.7 F (36.5 C) 98.2 F (36.8 C) 98 F (36.7 C) 97.7 F (36.5 C)  TempSrc: Oral Oral Oral Oral  SpO2: 100% 100% 100% 100%  Weight:        Intake/Output Summary (Last 24 hours) at 06/11/2022 1100 Last data filed at 06/11/2022 0900 Gross per  24 hour  Intake 220 ml  Output --  Net 220 ml   Filed Weights   06/09/22 0546 06/09/22 0734 06/09/22 1129  Weight: 98.2 kg 93.1 kg 92.1 kg    Examination:  Physical Exam: GEN: NAD, alert and oriented x 3, chronically ill appearance HEENT: NCAT, PERRL, EOMI, sclera clear, MMM, sutures noted to right neck in place PULM: CTAB w/o wheezes/crackles, normal respiratory effort, on 3 L nasal cannula which is her baseline CV: RRR w/o M/G/R GI: abd soft, NTND, NABS, no R/G/M MSK: no peripheral edema, right BKA site noted with Ace wrap/dressing in place, clean/dry/intact NEURO: CN II-XII intact, no focal deficits, sensation to light touch intact PSYCH: normal mood/affect Integumentary: Right BKA site noted with dressing in place, left forearm surgical wound noted with sutures in place, right neck with sutures in place.  Otherwise no other concerning rashes/lesions/wounds noted on exposed skin surfaces.      Data Reviewed: I have personally reviewed following labs and imaging studies  CBC: Recent Labs  Lab 06/05/22 0253 06/06/22 0533 06/07/22 0141 06/07/22 1426 06/08/22 0742 06/09/22 0332 06/10/22 0330  WBC 10.4 10.7* 9.6  --  7.5 7.4 7.0  NEUTROABS 8.6*  --   --   --   --   --   --   HGB 8.9* 8.3* 7.9* 8.7* 7.7* 8.0* 7.7*  HCT 29.1* 26.5* 26.3* 27.9* 24.8* 27.2* 25.9*  MCV 88.2 87.5 88.0  --  87.3 89.2 87.5  PLT 246 187 201  --  186 203 199  Basic Metabolic Panel: Recent Labs  Lab 06/06/22 0533 06/07/22 0141 06/07/22 0934 06/08/22 0742 06/09/22 0332 06/10/22 0330  NA 130*  131* 129* 129* 129* 128* 130*  K 3.7  3.8 4.0 3.6 4.1 4.2 3.8  CL 93*  93* 94* 94* 96* 94* 95*  CO2 '26  26 28 27 25 23 29  '$ GLUCOSE 195*  193* 135* 112* 366* 328* 343*  BUN 38*  37* 18 20 30* 37* 27*  CREATININE 2.99*  3.00* 2.04* 2.25* 3.33* 3.88* 2.30*  CALCIUM 8.1*  8.1* 7.8* 7.9* 8.0* 8.4* 8.5*  MG 2.5*  --   --   --   --   --   PHOS 2.6 1.7*  --  3.4  --  2.6   GFR: Estimated  Creatinine Clearance: 24.4 mL/min (A) (by C-G formula based on SCr of 2.3 mg/dL (H)). Liver Function Tests: Recent Labs  Lab 06/05/22 0253 06/06/22 0533 06/07/22 0141 06/08/22 0742  AST 11*  --   --   --   ALT 11  --   --   --   ALKPHOS 101  --   --   --   BILITOT 0.6  --   --   --   PROT 5.8*  --   --   --   ALBUMIN 2.0* 1.6* 1.7* 1.8*   Recent Labs  Lab 06/05/22 0253  LIPASE 28   No results for input(s): "AMMONIA" in the last 168 hours. Coagulation Profile: No results for input(s): "INR", "PROTIME" in the last 168 hours. Cardiac Enzymes: No results for input(s): "CKTOTAL", "CKMB", "CKMBINDEX", "TROPONINI" in the last 168 hours. BNP (last 3 results) No results for input(s): "PROBNP" in the last 8760 hours. HbA1C: No results for input(s): "HGBA1C" in the last 72 hours. CBG: Recent Labs  Lab 06/10/22 0723 06/10/22 1123 06/10/22 1541 06/10/22 2232 06/11/22 0717  GLUCAP 229* 147* 180* 297* 223*   Lipid Profile: No results for input(s): "CHOL", "HDL", "LDLCALC", "TRIG", "CHOLHDL", "LDLDIRECT" in the last 72 hours. Thyroid Function Tests: No results for input(s): "TSH", "T4TOTAL", "FREET4", "T3FREE", "THYROIDAB" in the last 72 hours. Anemia Panel: No results for input(s): "VITAMINB12", "FOLATE", "FERRITIN", "TIBC", "IRON", "RETICCTPCT" in the last 72 hours. Sepsis Labs: No results for input(s): "PROCALCITON", "LATICACIDVEN" in the last 168 hours.  Recent Results (from the past 240 hour(s))  C Difficile Quick Screen w PCR reflex     Status: Abnormal   Collection Time: 06/05/22  9:03 AM   Specimen: STOOL  Result Value Ref Range Status   C Diff antigen POSITIVE (A) NEGATIVE Final   C Diff toxin NEGATIVE NEGATIVE Final   C Diff interpretation Results are indeterminate. See PCR results.  Final    Comment: Performed at [facility], [place] [address], [place], [place] [address]  C. Diff by PCR, Reflexed     Status: Abnormal   Collection Time: 06/05/22  9:03 AM   Result Value Ref Range Status   Toxigenic C. Difficile by PCR POSITIVE (A) NEGATIVE Final    Comment: Positive for toxigenic C. difficile with little to no toxin production. Only treat if clinical presentation suggests symptomatic illness. Performed at [facility], [place] [address], [place], [place] [address]   Blood culture (routine x 2)     Status: None   Collection Time: 06/05/22  9:21 AM   Specimen: BLOOD RIGHT ARM  Result Value Ref Range Status   Specimen Description BLOOD RIGHT ARM  Final   Special  Requests   Final    BOTTLES DRAWN AEROBIC AND ANAEROBIC Blood Culture results may not be optimal due to an inadequate volume of blood received in culture bottles   Culture   Final    NO GROWTH 5 DAYS Performed at [facility], [place] [address] [place], [place] [address]    Report Status 06/10/2022 FINAL  Final         Radiology Studies: CT CHEST WO CONTRAST  Result Date: 06/10/2022 CLINICAL DATA:  Chronic dyspnea, chest wall or pleural disease suspected EXAM: CT CHEST WITHOUT CONTRAST TECHNIQUE: Multidetector CT imaging of the chest was performed following the standard protocol without IV contrast. RADIATION DOSE REDUCTION: This exam was performed according to the departmental dose-optimization program which includes automated exposure control, adjustment of the mA and/or kV according to patient size and/or use of iterative reconstruction technique. COMPARISON:  12/10/2005 FINDINGS: Cardiovascular: RIGHT jugular line with tip at cavoatrial junction. Extensive atherosclerotic calcifications aorta, proximal great vessels and coronary arteries. Aorta normal caliber. Mild enlargement of cardiac chambers. No pericardial effusion. Mediastinum/Nodes: Esophagus unremarkable. Elongated RIGHT thyroid lobe. Enlarged precarinal lymph node 14 mm image 54. Calcified subcarinal and RIGHT hilar nodes. Lungs/Pleura: Small LEFT pleural effusion. Compressive atelectasis LEFT lower lobe. Remaining  lungs clear. No pulmonary infiltrate or pneumothorax. Calcified granuloma anteromedial RIGHT upper lobe. Upper Abdomen: Extensive atherosclerotic calcifications throughout celiac axis. Remaining visualized upper abdomen unremarkable. Musculoskeletal: No acute osseous findings. IMPRESSION: Small LEFT pleural effusion with compressive atelectasis of LEFT lower lobe. Old granulomatous disease. Extensive atherosclerotic calcifications including coronary arteries. Single nonspecific mildly enlarged precarinal lymph node 14 mm diameter. Aortic Atherosclerosis (ICD10-I70.0). Electronically Signed   By: Lavonia Dana M.D.   On: 06/10/2022 13:49   DG Abd Portable 1V  Result Date: 06/10/2022 CLINICAL DATA:  Constipation EXAM: PORTABLE ABDOMEN - 1 VIEW COMPARISON:  X-ray 06/09/2022.  CT 06/05/2022 and older FINDINGS: Underpenetrated portable x-ray. Gas is seen in nondilated loops of small large bowel. Scattered mild colonic stool but less overall than previous. No frank obstruction. No obvious free air on this portable supine radiograph. Overlapping cardiac leads. IMPRESSION: Improving colonic stool.  Nonspecific bowel gas pattern Electronically Signed   By: Jill Side M.D.   On: 06/10/2022 12:25        Scheduled Meds:  (feeding supplement) PROSource Plus  30 mL Oral BID BM   amiodarone  200 mg Oral Daily   Chlorhexidine Gluconate Cloth  6 each Topical Q0600   docusate sodium  100 mg Oral BID   Gerhardt's butt cream   Topical TID   guaiFENesin  600 mg Oral BID   insulin aspart  0-15 Units Subcutaneous TID WC   insulin aspart  0-5 Units Subcutaneous QHS   insulin aspart  5 Units Subcutaneous TID WC   insulin glargine-yfgn  25 Units Subcutaneous QHS   levothyroxine  50 mcg Oral QAC breakfast   midodrine  10 mg Oral Q M,W,F-HD   midodrine  5 mg Oral TID WC   multivitamin  1 tablet Oral Daily   pantoprazole  40 mg Oral Daily   pregabalin  75 mg Oral Daily   saccharomyces boulardii  500 mg Oral BID    senna  1 tablet Oral Daily   sodium chloride flush  3 mL Intravenous Q12H   vancomycin  125 mg Oral QID   vortioxetine HBr  20 mg Oral q AM   Continuous Infusions:   LOS: 6 days    Time spent: 50 minutes  spent on chart review, discussion with nursing staff, consultants, updating family and interview/physical exam; more than 50% of that time was spent in counseling and/or coordination of care.    Jeneva Schweizer J [place] DO [facility] Available via Epic secure chat 7am-7pm After these hours, please refer to coverage provider listed on [email or web address] 06/11/2022, 11:00 AM

## 2022-06-12 ENCOUNTER — Inpatient Hospital Stay (HOSPITAL_COMMUNITY): Payer: PPO

## 2022-06-12 ENCOUNTER — Encounter (HOSPITAL_COMMUNITY): Payer: PPO

## 2022-06-12 DIAGNOSIS — K5792 Diverticulitis of intestine, part unspecified, without perforation or abscess without bleeding: Secondary | ICD-10-CM | POA: Diagnosis not present

## 2022-06-12 LAB — CBC
HCT: 24.9 % — ABNORMAL LOW (ref 36.0–46.0)
Hemoglobin: 7.5 g/dL — ABNORMAL LOW (ref 12.0–15.0)
MCH: 26.6 pg (ref 26.0–34.0)
MCHC: 30.1 g/dL (ref 30.0–36.0)
MCV: 88.3 fL (ref 80.0–100.0)
Platelets: 191 10*3/uL (ref 150–400)
RBC: 2.82 MIL/uL — ABNORMAL LOW (ref 3.87–5.11)
RDW: 18.7 % — ABNORMAL HIGH (ref 11.5–15.5)
WBC: 6.2 10*3/uL (ref 4.0–10.5)
nRBC: 0 % (ref 0.0–0.2)

## 2022-06-12 LAB — RESP PANEL BY RT-PCR (RSV, FLU A&B, COVID)  RVPGX2
Influenza A by PCR: NEGATIVE
Influenza B by PCR: NEGATIVE
Resp Syncytial Virus by PCR: NEGATIVE
SARS Coronavirus 2 by RT PCR: NEGATIVE

## 2022-06-12 LAB — RENAL FUNCTION PANEL
Albumin: 2 g/dL — ABNORMAL LOW (ref 3.5–5.0)
Anion gap: 8 (ref 5–15)
BUN: 31 mg/dL — ABNORMAL HIGH (ref 8–23)
CO2: 26 mmol/L (ref 22–32)
Calcium: 8.5 mg/dL — ABNORMAL LOW (ref 8.9–10.3)
Chloride: 95 mmol/L — ABNORMAL LOW (ref 98–111)
Creatinine, Ser: 2.73 mg/dL — ABNORMAL HIGH (ref 0.44–1.00)
GFR, Estimated: 18 mL/min — ABNORMAL LOW (ref >60)
Glucose, Bld: 329 mg/dL — ABNORMAL HIGH (ref 70–99)
Phosphorus: 3.1 mg/dL (ref 2.5–4.6)
Potassium: 4.5 mmol/L (ref 3.5–5.1)
Sodium: 129 mmol/L — ABNORMAL LOW (ref 135–145)

## 2022-06-12 LAB — GLUCOSE, CAPILLARY
Glucose-Capillary: 303 mg/dL — ABNORMAL HIGH (ref 70–99)
Glucose-Capillary: 266 mg/dL — ABNORMAL HIGH (ref 70–99)
Glucose-Capillary: 310 mg/dL — ABNORMAL HIGH (ref 70–99)
Glucose-Capillary: 322 mg/dL — ABNORMAL HIGH (ref 70–99)
Glucose-Capillary: 225 mg/dL — ABNORMAL HIGH (ref 70–99)

## 2022-06-12 MED ORDER — TRANEXAMIC ACID-NACL 1000-0.7 MG/100ML-% IV SOLN
1000.0000 mg | INTRAVENOUS | Status: AC
  Administered 2022-06-13: 1000 mg via INTRAVENOUS
  Filled 2022-06-12 (×3): qty 100

## 2022-06-12 MED ORDER — HEPARIN SODIUM (PORCINE) 1000 UNIT/ML IJ SOLN
INTRAMUSCULAR | Status: AC
  Administered 2022-06-12: 3800 [IU]
  Filled 2022-06-12: qty 4

## 2022-06-12 MED ORDER — CEFAZOLIN SODIUM-DEXTROSE 2-4 GM/100ML-% IV SOLN
2.0000 g | INTRAVENOUS | Status: DC
  Filled 2022-06-12: qty 100

## 2022-06-12 MED ORDER — ALPRAZOLAM 0.5 MG PO TABS
0.5000 mg | ORAL_TABLET | Freq: Once | ORAL | Status: AC
  Administered 2022-06-12: 0.5 mg via ORAL
  Filled 2022-06-12: qty 1

## 2022-06-12 MED ORDER — INSULIN GLARGINE-YFGN 100 UNIT/ML ~~LOC~~ SOLN
30.0000 [IU] | Freq: Every day | SUBCUTANEOUS | Status: DC
  Administered 2022-06-12: 30 [IU] via SUBCUTANEOUS
  Filled 2022-06-12 (×2): qty 0.3

## 2022-06-12 MED ORDER — VANCOMYCIN HCL 125 MG PO CAPS
125.0000 mg | ORAL_CAPSULE | Freq: Two times a day (BID) | ORAL | Status: AC
  Administered 2022-06-12 – 2022-06-16 (×10): 125 mg via ORAL
  Filled 2022-06-12 (×9): qty 1

## 2022-06-12 MED ORDER — POLYETHYLENE GLYCOL 3350 17 G PO PACK
17.0000 g | PACK | Freq: Every day | ORAL | Status: DC
  Administered 2022-06-12 – 2022-06-15 (×3): 17 g via ORAL
  Filled 2022-06-12 (×4): qty 1

## 2022-06-12 NOTE — H&P (View-Only) (Signed)
Patient ID: Susan [name], female   DOB: 1950-12-17, 72 y.o.   MRN: [id number] Patient with significant fluid diuresis since last exam.  She still has necrotic changes to the right transtibial amputation and will plan for revision of the right transtibial amputation on Friday tomorrow.

## 2022-06-12 NOTE — Progress Notes (Signed)
Patient ID: Susan [name], female   DOB: 22-Jun-1950, 72 y.o.   MRN: [id number] Patient with significant fluid diuresis since last exam.  She still has necrotic changes to the right transtibial amputation and will plan for revision of the right transtibial amputation on Friday tomorrow.

## 2022-06-12 NOTE — Plan of Care (Signed)
  Problem: Education: Goal: Knowledge of General Education information will improve Description: Including pain rating scale, medication(s)/side effects and non-pharmacologic comfort measures Outcome: Progressing   Problem: Clinical Measurements: Goal: Ability to maintain clinical measurements within normal limits will improve Outcome: Progressing Goal: Will remain free from infection Outcome: Progressing Goal: Diagnostic test results will improve Outcome: Progressing Goal: Respiratory complications will improve Outcome: Progressing Goal: Cardiovascular complication will be avoided Outcome: Progressing   Problem: Coping: Goal: Level of anxiety will decrease Outcome: Progressing   Problem: Elimination: Goal: Will not experience complications related to bowel motility Outcome: Progressing   Problem: Pain Managment: Goal: General experience of comfort will improve Outcome: Progressing   Problem: Safety: Goal: Ability to remain free from injury will improve Outcome: Progressing   Problem: Skin Integrity: Goal: Risk for impaired skin integrity will decrease Outcome: Progressing   Problem: Education: Goal: Ability to describe self-care measures that may prevent or decrease complications (Diabetes Survival Skills Education) will improve Outcome: Progressing Goal: Individualized Educational Video(s) Outcome: Progressing   Problem: Coping: Goal: Ability to adjust to condition or change in health will improve Outcome: Progressing   Problem: Fluid Volume: Goal: Ability to maintain a balanced intake and output will improve Outcome: Progressing   Problem: Health Behavior/Discharge Planning: Goal: Ability to identify and utilize available resources and services will improve Outcome: Progressing Goal: Ability to manage health-related needs will improve Outcome: Progressing   Problem: Metabolic: Goal: Ability to maintain appropriate glucose levels will improve Outcome:  Progressing   Problem: Nutritional: Goal: Maintenance of adequate nutrition will improve Outcome: Progressing Goal: Progress toward achieving an optimal weight will improve Outcome: Progressing   Problem: Skin Integrity: Goal: Risk for impaired skin integrity will decrease Outcome: Progressing   Problem: Tissue Perfusion: Goal: Adequacy of tissue perfusion will improve Outcome: Progressing

## 2022-06-12 NOTE — Progress Notes (Signed)
[facility] Progress Note   Subjective:    Seen in room. Completed dialysis yesterday net UF 3L. Feels like she can't catch her breath today. Plan for dialysis today to avoid interfering with surgery schedule tomorrow - revision R BKA    Objective Vitals:   06/11/22 1631 06/11/22 2037 06/12/22 0512 06/12/22 0940  BP: (!) 131/54 (!) 114/47 117/77 (!) 113/41  Pulse: 90 100 81 80  Resp: '16  13 19  '$ Temp: 98 F (36.7 C) 97.7 F (36.5 C) 98.3 F (36.8 C) 98.3 F (36.8 C)  TempSrc: Oral Oral Oral   SpO2: 100% 99% 100% 100%  Weight:   91.2 kg    Physical Exam General: Awake, alert, NAD Heart: S1 and S2; No murmurs, gallops, or rubs Lungs: clear anteriorly Abdomen: Tender lower abd quadrants Extremities:R BKA; 1+ edema LLE with excorations and rubor Dialysis Access: RIJ [facility]   Filed Weights   06/11/22 1051 06/11/22 1430 06/12/22 0512  Weight: 93.3 kg 90.8 kg 91.2 kg    Intake/Output Summary (Last 24 hours) at 06/12/2022 1010 Last data filed at 06/12/2022 0800 Gross per 24 hour  Intake 1020 ml  Output 3000 ml  Net -1980 ml     Additional Objective Labs: Basic Metabolic Panel: Recent Labs  Lab 06/07/22 0141 06/07/22 0934 06/08/22 0742 06/09/22 0332 06/10/22 0330  NA 129*   < > 129* 128* 130*  K 4.0   < > 4.1 4.2 3.8  CL 94*   < > 96* 94* 95*  CO2 28   < > '25 23 29  '$ GLUCOSE 135*   < > 366* 328* 343*  BUN 18   < > 30* 37* 27*  CREATININE 2.04*   < > 3.33* 3.88* 2.30*  CALCIUM 7.8*   < > 8.0* 8.4* 8.5*  PHOS 1.7*  --  3.4  --  2.6   < > = values in this interval not displayed.    Liver Function Tests: Recent Labs  Lab 06/06/22 0533 06/07/22 0141 06/08/22 0742  ALBUMIN 1.6* 1.7* 1.8*    No results for input(s): "LIPASE", "AMYLASE" in the last 168 hours.  CBC: Recent Labs  Lab 06/06/22 0533 06/07/22 0141 06/07/22 1426 06/08/22 0742 06/09/22 0332 06/10/22 0330  WBC 10.7* 9.6  --  7.5 7.4 7.0  HGB 8.3* 7.9*   < > 7.7* 8.0* 7.7*  HCT  26.5* 26.3*   < > 24.8* 27.2* 25.9*  MCV 87.5 88.0  --  87.3 89.2 87.5  PLT 187 201  --  186 203 199   < > = values in this interval not displayed.    Blood Culture    Component Value Date/Time   SDES BLOOD RIGHT ARM 06/05/2022 0921   SPECREQUEST  06/05/2022 9937    BOTTLES DRAWN AEROBIC AND ANAEROBIC Blood Culture results may not be optimal due to an inadequate volume of blood received in culture bottles   CULT  06/05/2022 0921    NO GROWTH 5 DAYS Performed at [facility], [place] [address] [place], [place] [address]    REPTSTATUS 06/10/2022 FINAL 06/05/2022 8938    Cardiac Enzymes: No results for input(s): "CKTOTAL", "CKMB", "CKMBINDEX", "TROPONINI" in the last 168 hours. CBG: Recent Labs  Lab 06/11/22 0717 06/11/22 1631 06/11/22 2126 06/11/22 2318 06/12/22 0804  GLUCAP 223* 313* 310* 303* 266*    Iron Studies: No results for input(s): "IRON", "TIBC", "TRANSFERRIN", "FERRITIN" in the last 72 hours. Lab Results  Component Value Date  INR 1.4 (H) 05/22/2022   INR 1.5 (H) 02/01/2022   INR 1.3 (H) 01/14/2022   Studies/Results: DG Abd Portable 1V  Result Date: 06/12/2022 CLINICAL DATA:  Encounter for constipation. EXAM: PORTABLE ABDOMEN - 1 VIEW COMPARISON:  Radiographs 06/10/2022 and 08/15/2020.  CT 06/05/2022. FINDINGS: 0438 hours. Two supine views of the abdomen are submitted. The bowel gas pattern appears normal. Colonic stool burden does not appear excessive. No supine evidence of bowel wall thickening or pneumoperitoneum. There are scattered vascular calcifications without suspicious abdominal calcifications. Mild spondylosis noted. IMPRESSION: No acute findings or radiographic evidence of significant constipation. Electronically Signed   By: Richardean Sale M.D.   On: 06/12/2022 08:16   CT CHEST WO CONTRAST  Result Date: 06/12/2022 CLINICAL DATA:  Respiratory illness, nondiagnostic x-ray. EXAM: CT CHEST WITHOUT CONTRAST TECHNIQUE: Multidetector CT imaging of  the chest was performed following the standard protocol without IV contrast. RADIATION DOSE REDUCTION: This exam was performed according to the departmental dose-optimization program which includes automated exposure control, adjustment of the mA and/or kV according to patient size and/or use of iterative reconstruction technique. COMPARISON:  Chest radiographs 06/11/2022 and 06/07/2022. CT chest 06/10/2022 and abdominal CTA 05/23/2022. FINDINGS: Cardiovascular: Right IJ central venous catheter extends to the superior cavoatrial junction. Diffuse atherosclerosis of the aorta, great vessels and coronary arteries. The heart size is normal. There is no pericardial effusion. Mediastinum/Nodes: Again demonstrated are calcified subcarinal and right hilar lymph nodes and a mildly enlarged precarinal node measuring 1.5 cm short axis on image 55/3. No axillary adenopathy. The thyroid gland, trachea and esophagus demonstrate no significant findings. Lungs/Pleura: No significant change in small left pleural effusion with adjacent compressive left lower lobe atelectasis. There is mildly increased subsegmental atelectasis at the right lung base. Underlying central airway thickening is present. There are stable scattered small calcified granulomas. No confluent airspace opacity or suspicious pulmonary nodule. Upper abdomen: Scattered calcified granulomas in the liver and spleen. Hypodense lesion involving the medial aspect of the spleen has enlarged from recent abdominal CTA and is now so seated with a probable complex fluid collection extending along the anteromedial aspect of the spleen, measuring up to 6.6 x 5.9 cm on image 123/3. There is no air within this collection or surrounding inflammatory change. Musculoskeletal/Chest wall: There is no chest wall mass or suspicious osseous finding. Level spondylosis. IMPRESSION: 1. No significant change in small left pleural effusion with adjacent compressive left lower lobe  atelectasis. Mildly increased right basilar subsegmental atelectasis. 2. Enlarging hypodense lesion involving the medial aspect of the spleen with a probable complex fluid collection extending along the anteromedial aspect of the spleen. This could reflect a splenic abscess or subacute infarct with adjacent hematoma. No air within this collection or surrounding inflammatory changes. Consider further evaluation with ultrasound or follow-up abdominal CT with contrast. 3. Sequela of prior granulomatous disease with unchanged, nonspecific mildly enlarged precarinal lymph node. 4.  Aortic Atherosclerosis (ICD10-I70.0). Electronically Signed   By: Richardean Sale M.D.   On: 06/12/2022 08:13   DG CHEST PORT 1 VIEW  Result Date: 06/11/2022 CLINICAL DATA:  Shortness of breath.  [address]26 EXAM: PORTABLE CHEST 1 VIEW COMPARISON:  06/07/2022 FINDINGS: Right central venous catheter with tip over the low SVC region. No pneumothorax. Borderline heart size with normal pulmonary vascularity. Lungs are clear. No pleural effusions. No pneumothorax. Mediastinal contours appear intact. Calcification of the aorta. Degenerative changes in the spine and shoulders. No change since prior study. IMPRESSION: No active disease. Electronically Signed  By: Lucienne Capers M.D.   On: 06/11/2022 23:03   CT CHEST WO CONTRAST  Result Date: 06/10/2022 CLINICAL DATA:  Chronic dyspnea, chest wall or pleural disease suspected EXAM: CT CHEST WITHOUT CONTRAST TECHNIQUE: Multidetector CT imaging of the chest was performed following the standard protocol without IV contrast. RADIATION DOSE REDUCTION: This exam was performed according to the departmental dose-optimization program which includes automated exposure control, adjustment of the mA and/or kV according to patient size and/or use of iterative reconstruction technique. COMPARISON:  12/10/2005 FINDINGS: Cardiovascular: RIGHT jugular line with tip at cavoatrial junction. Extensive atherosclerotic  calcifications aorta, proximal great vessels and coronary arteries. Aorta normal caliber. Mild enlargement of cardiac chambers. No pericardial effusion. Mediastinum/Nodes: Esophagus unremarkable. Elongated RIGHT thyroid lobe. Enlarged precarinal lymph node 14 mm image 54. Calcified subcarinal and RIGHT hilar nodes. Lungs/Pleura: Small LEFT pleural effusion. Compressive atelectasis LEFT lower lobe. Remaining lungs clear. No pulmonary infiltrate or pneumothorax. Calcified granuloma anteromedial RIGHT upper lobe. Upper Abdomen: Extensive atherosclerotic calcifications throughout celiac axis. Remaining visualized upper abdomen unremarkable. Musculoskeletal: No acute osseous findings. IMPRESSION: Small LEFT pleural effusion with compressive atelectasis of LEFT lower lobe. Old granulomatous disease. Extensive atherosclerotic calcifications including coronary arteries. Single nonspecific mildly enlarged precarinal lymph node 14 mm diameter. Aortic Atherosclerosis (ICD10-I70.0). Electronically Signed   By: Lavonia Dana M.D.   On: 06/10/2022 13:49   DG Abd Portable 1V  Result Date: 06/10/2022 CLINICAL DATA:  Constipation EXAM: PORTABLE ABDOMEN - 1 VIEW COMPARISON:  X-ray 06/09/2022.  CT 06/05/2022 and older FINDINGS: Underpenetrated portable x-ray. Gas is seen in nondilated loops of small large bowel. Scattered mild colonic stool but less overall than previous. No frank obstruction. No obvious free air on this portable supine radiograph. Overlapping cardiac leads. IMPRESSION: Improving colonic stool.  Nonspecific bowel gas pattern Electronically Signed   By: Jill Side M.D.   On: 06/10/2022 12:25    Medications:   (feeding supplement) PROSource Plus  30 mL Oral BID BM   amiodarone  200 mg Oral Daily   Chlorhexidine Gluconate Cloth  6 each Topical Q0600   docusate sodium  100 mg Oral BID   Gerhardt's butt cream   Topical TID   guaiFENesin  600 mg Oral BID   insulin aspart  0-15 Units Subcutaneous TID WC    insulin aspart  0-5 Units Subcutaneous QHS   insulin aspart  5 Units Subcutaneous TID WC   insulin glargine-yfgn  25 Units Subcutaneous QHS   levothyroxine  50 mcg Oral QAC breakfast   midodrine  10 mg Oral Q M,W,F-HD   midodrine  5 mg Oral TID WC   multivitamin  1 tablet Oral Daily   pantoprazole  40 mg Oral Daily   pregabalin  75 mg Oral Daily   saccharomyces boulardii  500 mg Oral BID   senna  1 tablet Oral Daily   sodium chloride flush  3 mL Intravenous Q12H   vancomycin  125 mg Oral BID   vortioxetine HBr  20 mg Oral q AM    Dialysis Orders: SW MWF  4h  400/800   97kg   3K/2.5Ca bath  TDC  Hep none - last HD 1/10, post wt 98.8 - mircera 150 mcg q2, last 1/08, due 1/22  Assessment/Plan: Abd pain/ stercoral proctitis w/o evidence of perfortaion/ acute diverticulitis - per abd CT. Hx of recent Cdif infection. Po vanc started for suspected Cdif, gen surg and ID consulted. On Augmentin R BKA Dehiscence - Followed by Dr. Sharol Given: plan for  tentative surgery on Friday 1/19.  DM2 - uncont, per pmd ESRD - on HD MWF. Had HD 1/17, Off schedule today 1/18 d/t #2   On midodrine HTN/ volume - sig LE / hip edema on examm, no resp issues. BP's are soft at admit but now stable. Get a good bed weight.  Continue max UF as tolerated.  Anemia esrd - Hb now 7.7, next ESA due on Jan 22. Transfuse prn.  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 Holding binders for now. Not on vdra. Monitor trend.  Albumin 1.7, add prosource PAF - no longer on a/c due to hx of GIB H/o PAD - sp R BKA Recent bleeding rectal ulcer - rx'd surgically dec 2023  Lynnda Child PA-C [place] 06/12/2022,10:10 AM

## 2022-06-12 NOTE — Progress Notes (Signed)
Received patient in bed to unit.  Alert and oriented.  Informed consent signed and in chart.   Treatment initiated: 1151 Treatment completed: 1501  Patient tolerated well.  Transported back to the room  Alert, without acute distress.  Hand-off given to patient's nurse.   Access used: Catheter Access issues: none  Total UF removed: 2L Medication(s) given: Midodrine Post HD VS: 132/52,79,14,97.9,100% Post HD weight: 91.1kg   [facility]

## 2022-06-12 NOTE — Progress Notes (Signed)
Pt placed Enteric precautions d/t positive c-diff. Pt aware.

## 2022-06-12 NOTE — Progress Notes (Addendum)
[facility] Progress Note     Subjective: CC-  Abdomen still sore but feeling much better. Mild nausea, no emesis. Ate 100% of her breakfast this morning. She had 2-3 soft stools yesterday.  Xray with no acute findings or radiographic evidence of significant constipation.  Objective: Vital signs in last 24 hours: Temp:  [97.7 F (36.5 C)-98.3 F (36.8 C)] 98.3 F (36.8 C) (01/18 0940) Pulse Rate:  [73-100] 80 (01/18 0940) Resp:  [10-19] 19 (01/18 0940) BP: (92-131)/(41-77) 113/41 (01/18 0940) SpO2:  [99 %-100 %] 100 % (01/18 0940) Weight:  [90.8 kg-93.3 kg] 91.2 kg (01/18 0512) Last BM Date : 06/11/22  Intake/Output from previous day: 01/17 0701 - 01/18 0700 In: 940 [P.O.:940] Out: 3000  Intake/Output this shift: Total I/O In: 300 [P.O.:300] Out: -   PE: Gen:  Alert, NAD, pleasant Pulm:  Normal rate and effort Abd: Soft, nondistended, initially with some suprapubic tenderness but when distracted her abdomen is completely nontender  Lab Results:  Recent Labs    06/10/22 0330  WBC 7.0  HGB 7.7*  HCT 25.9*  PLT 199   BMET Recent Labs    06/10/22 0330  NA 130*  K 3.8  CL 95*  CO2 29  GLUCOSE 343*  BUN 27*  CREATININE 2.30*  CALCIUM 8.5*   PT/INR No results for input(s): "LABPROT", "INR" in the last 72 hours. CMP     Component Value Date/Time   NA 130 (L) 06/10/2022 0330   K 3.8 06/10/2022 0330   CL 95 (L) 06/10/2022 0330   CO2 29 06/10/2022 0330   GLUCOSE 343 (H) 06/10/2022 0330   GLUCOSE >444 (H) 06/07/2015 1311   BUN 27 (H) 06/10/2022 0330   CREATININE 2.30 (H) 06/10/2022 0330   CREATININE 6.22 (H) 03/26/2022 1620   CALCIUM 8.5 (L) 06/10/2022 0330   PROT 5.8 (L) 06/05/2022 0253   ALBUMIN 1.8 (L) 06/08/2022 0742   AST 11 (L) 06/05/2022 0253   ALT 11 06/05/2022 0253   ALKPHOS 101 06/05/2022 0253   BILITOT 0.6 06/05/2022 0253   GFRNONAA 22 (L) 06/10/2022 0330   GFRNONAA 11 (L) 11/15/2020 1452   GFRAA 13 (L) 11/15/2020 1452    Lipase     Component Value Date/Time   LIPASE 28 06/05/2022 0253       Studies/Results: DG Abd Portable 1V  Result Date: 06/12/2022 CLINICAL DATA:  Encounter for constipation. EXAM: PORTABLE ABDOMEN - 1 VIEW COMPARISON:  Radiographs 06/10/2022 and 08/15/2020.  CT 06/05/2022. FINDINGS: 0438 hours. Two supine views of the abdomen are submitted. The bowel gas pattern appears normal. Colonic stool burden does not appear excessive. No supine evidence of bowel wall thickening or pneumoperitoneum. There are scattered vascular calcifications without suspicious abdominal calcifications. Mild spondylosis noted. IMPRESSION: No acute findings or radiographic evidence of significant constipation. Electronically Signed   By: Richardean Sale M.D.   On: 06/12/2022 08:16   CT CHEST WO CONTRAST  Result Date: 06/12/2022 CLINICAL DATA:  Respiratory illness, nondiagnostic x-ray. EXAM: CT CHEST WITHOUT CONTRAST TECHNIQUE: Multidetector CT imaging of the chest was performed following the standard protocol without IV contrast. RADIATION DOSE REDUCTION: This exam was performed according to the departmental dose-optimization program which includes automated exposure control, adjustment of the mA and/or kV according to patient size and/or use of iterative reconstruction technique. COMPARISON:  Chest radiographs 06/11/2022 and 06/07/2022. CT chest 06/10/2022 and abdominal CTA 05/23/2022. FINDINGS: Cardiovascular: Right IJ central venous catheter extends to the superior cavoatrial junction. Diffuse atherosclerosis of the  aorta, great vessels and coronary arteries. The heart size is normal. There is no pericardial effusion. Mediastinum/Nodes: Again demonstrated are calcified subcarinal and right hilar lymph nodes and a mildly enlarged precarinal node measuring 1.5 cm short axis on image 55/3. No axillary adenopathy. The thyroid gland, trachea and esophagus demonstrate no significant findings. Lungs/Pleura: No significant  change in small left pleural effusion with adjacent compressive left lower lobe atelectasis. There is mildly increased subsegmental atelectasis at the right lung base. Underlying central airway thickening is present. There are stable scattered small calcified granulomas. No confluent airspace opacity or suspicious pulmonary nodule. Upper abdomen: Scattered calcified granulomas in the liver and spleen. Hypodense lesion involving the medial aspect of the spleen has enlarged from recent abdominal CTA and is now so seated with a probable complex fluid collection extending along the anteromedial aspect of the spleen, measuring up to 6.6 x 5.9 cm on image 123/3. There is no air within this collection or surrounding inflammatory change. Musculoskeletal/Chest wall: There is no chest wall mass or suspicious osseous finding. Level spondylosis. IMPRESSION: 1. No significant change in small left pleural effusion with adjacent compressive left lower lobe atelectasis. Mildly increased right basilar subsegmental atelectasis. 2. Enlarging hypodense lesion involving the medial aspect of the spleen with a probable complex fluid collection extending along the anteromedial aspect of the spleen. This could reflect a splenic abscess or subacute infarct with adjacent hematoma. No air within this collection or surrounding inflammatory changes. Consider further evaluation with ultrasound or follow-up abdominal CT with contrast. 3. Sequela of prior granulomatous disease with unchanged, nonspecific mildly enlarged precarinal lymph node. 4.  Aortic Atherosclerosis (ICD10-I70.0). Electronically Signed   By: Richardean Sale M.D.   On: 06/12/2022 08:13   DG CHEST PORT 1 VIEW  Result Date: 06/11/2022 CLINICAL DATA:  Shortness of breath.  [address]26 EXAM: PORTABLE CHEST 1 VIEW COMPARISON:  06/07/2022 FINDINGS: Right central venous catheter with tip over the low SVC region. No pneumothorax. Borderline heart size with normal pulmonary vascularity.  Lungs are clear. No pleural effusions. No pneumothorax. Mediastinal contours appear intact. Calcification of the aorta. Degenerative changes in the spine and shoulders. No change since prior study. IMPRESSION: No active disease. Electronically Signed   By: Lucienne Capers M.D.   On: 06/11/2022 23:03   CT CHEST WO CONTRAST  Result Date: 06/10/2022 CLINICAL DATA:  Chronic dyspnea, chest wall or pleural disease suspected EXAM: CT CHEST WITHOUT CONTRAST TECHNIQUE: Multidetector CT imaging of the chest was performed following the standard protocol without IV contrast. RADIATION DOSE REDUCTION: This exam was performed according to the departmental dose-optimization program which includes automated exposure control, adjustment of the mA and/or kV according to patient size and/or use of iterative reconstruction technique. COMPARISON:  12/10/2005 FINDINGS: Cardiovascular: RIGHT jugular line with tip at cavoatrial junction. Extensive atherosclerotic calcifications aorta, proximal great vessels and coronary arteries. Aorta normal caliber. Mild enlargement of cardiac chambers. No pericardial effusion. Mediastinum/Nodes: Esophagus unremarkable. Elongated RIGHT thyroid lobe. Enlarged precarinal lymph node 14 mm image 54. Calcified subcarinal and RIGHT hilar nodes. Lungs/Pleura: Small LEFT pleural effusion. Compressive atelectasis LEFT lower lobe. Remaining lungs clear. No pulmonary infiltrate or pneumothorax. Calcified granuloma anteromedial RIGHT upper lobe. Upper Abdomen: Extensive atherosclerotic calcifications throughout celiac axis. Remaining visualized upper abdomen unremarkable. Musculoskeletal: No acute osseous findings. IMPRESSION: Small LEFT pleural effusion with compressive atelectasis of LEFT lower lobe. Old granulomatous disease. Extensive atherosclerotic calcifications including coronary arteries. Single nonspecific mildly enlarged precarinal lymph node 14 mm diameter. Aortic Atherosclerosis (ICD10-I70.0).  Electronically Signed  By: Lavonia Dana M.D.   On: 06/10/2022 13:49   DG Abd Portable 1V  Result Date: 06/10/2022 CLINICAL DATA:  Constipation EXAM: PORTABLE ABDOMEN - 1 VIEW COMPARISON:  X-ray 06/09/2022.  CT 06/05/2022 and older FINDINGS: Underpenetrated portable x-ray. Gas is seen in nondilated loops of small large bowel. Scattered mild colonic stool but less overall than previous. No frank obstruction. No obvious free air on this portable supine radiograph. Overlapping cardiac leads. IMPRESSION: Improving colonic stool.  Nonspecific bowel gas pattern Electronically Signed   By: Jill Side M.D.   On: 06/10/2022 12:25    Anti-infectives: Anti-infectives (From admission, onward)    Start     Dose/Rate Route Frequency Ordered Stop   07/12/22 1000  vancomycin (VANCOCIN) capsule 125 mg  Status:  Discontinued       See Hyperspace for full Linked Orders Report.   125 mg Oral Every 3 DAYS 06/05/22 2301 06/06/22 1030   07/04/22 1000  vancomycin (VANCOCIN) capsule 125 mg  Status:  Discontinued       See Hyperspace for full Linked Orders Report.   125 mg Oral Every other day 06/05/22 2301 06/06/22 1030   06/27/22 1000  vancomycin (VANCOCIN) capsule 125 mg  Status:  Discontinued       See Hyperspace for full Linked Orders Report.   125 mg Oral Daily 06/05/22 2301 06/06/22 1030   06/19/22 2200  vancomycin (VANCOCIN) capsule 125 mg  Status:  Discontinued       See Hyperspace for full Linked Orders Report.   125 mg Oral 2 times daily 06/05/22 2301 06/06/22 1030   06/12/22 1000  vancomycin (VANCOCIN) capsule 125 mg        125 mg Oral 2 times daily 06/12/22 0912 06/17/22 0959   06/07/22 1445  amoxicillin-clavulanate (AUGMENTIN) 875-125 MG per tablet 1 tablet  Status:  Discontinued        1 tablet Oral Every 12 hours 06/07/22 1349 06/07/22 1351   06/07/22 1445  vancomycin (VANCOCIN) capsule 125 mg  Status:  Discontinued        125 mg Oral 4 times daily 06/07/22 1350 06/12/22 0912   06/07/22 1445   amoxicillin-clavulanate (AUGMENTIN) 500-125 MG per tablet 1 tablet        1 tablet Oral Every 12 hours 06/07/22 1351 06/09/22 2212   06/06/22 0000  vancomycin (VANCOCIN) capsule 125 mg  Status:  Discontinued       See Hyperspace for full Linked Orders Report.   125 mg Oral 4 times daily 06/05/22 2301 06/06/22 1030   06/05/22 1000  piperacillin-tazobactam (ZOSYN) IVPB 2.25 g  Status:  Discontinued        2.25 g 100 mL/hr over 30 Minutes Intravenous Every 8 hours 06/05/22 0931 06/06/22 1042   06/05/22 0930  metroNIDAZOLE (FLAGYL) IVPB 500 mg  Status:  Discontinued        500 mg 100 mL/hr over 60 Minutes Intravenous Every 12 hours 06/05/22 0925 06/05/22 0929   06/05/22 0915  metroNIDAZOLE (FLAGYL) IVPB 500 mg  Status:  Discontinued        500 mg 100 mL/hr over 60 Minutes Intravenous  Once 06/05/22 0900 06/05/22 1043   06/05/22 0915  piperacillin-tazobactam (ZOSYN) IVPB 3.375 g  Status:  Discontinued        3.375 g 12.5 mL/hr over 240 Minutes Intravenous Every 8 hours 06/05/22 0901 06/05/22 0926        Assessment/Plan 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 Discussed with patient that she will likely need a bowel regimen going forward to prevent this from happening again, given her chronic narcotic use and limited mobility. Scheduled colace and miralax. - NOTHING PER RECTUM to avoid further irritation and recurrent rectal bleeding.  We will sign off, please call with questions or concerns.   Perisplenic collection  - unclear etiology; possible spontaneous subcapsular hematoma, despite over two weeks off of DOAC. Lower suspicion for infectious collection as there is no gas in the collection. Possible subacute infarct with adjacent hematoma. - Repeat CT chest today showed  enlarging hypodense lesion involving the medial aspect of the spleen. WBC had normalized and pt afebrile, no s/s of infection. H/h stabilized indicating no ongoing bleeding. Will review imaging with MD, but suspect no intervention recommended.     ?c.dif colitis - I think test is positive because of her previous episode, no evidence of acute c.dif. Per primary team and ID; exam benign no emergent surgical needs    FEN - carb mod VTE - SCD's, ok for DVT ppx ID - Zosyn completed, finished augmentin and on prophylactic vanc for hx c.dif Admit - [facility] service    Recent admission for LGIB  Hx recurrent c.dif - followed by ID, last seen in November, improving CHF ESRD on HD DM2 Hypothyroidism ?cirrhosis   I reviewed hospitalist notes, last 24 h vitals and pain scores, and last 48 h intake and output.    LOS: 7 days    [facility] 06/12/2022, 10:46 AM Please see Amion for pager number during day hours 7:00am-4:30pm

## 2022-06-12 NOTE — Inpatient Diabetes Management (Signed)
Inpatient Diabetes Program Recommendations  AACE/ADA: New Consensus Statement on Inpatient Glycemic Control (2015)  Target Ranges:  Prepandial:   less than 140 mg/dL      Peak postprandial:   less than 180 mg/dL (1-2 hours)      Critically ill patients:  140 - 180 mg/dL   Lab Results  Component Value Date   GLUCAP 266 (H) 06/12/2022   HGBA1C 8.9 (H) 05/02/2022    Review of Glycemic Control  Diabetes history: DM  Outpatient Diabetes medications:  Omnipod insulin pump (total basal 25.9 units/day, Carb coverage 1:12 grams, Sensitivity factor 1:40 mg/dl ) not on insulin pump at SNF Current orders for Inpatient glycemic control:   Semglee 25 units qhs Novolog 0-15 units tid + hs Novolog 5 units tid meal coverage   A1c 8.9% on 12/8   Inpatient Diabetes Program Recommendations:    Please consider: -Increase Semglee to 30 units   Thank you, Bethena Roys E. Pritika Alvarez, RN, MSN, CDE  Diabetes Coordinator Inpatient Glycemic Control Team Team Pager [phone] (8am-5pm) 06/12/2022 10:07 AM

## 2022-06-12 NOTE — Progress Notes (Signed)
Occupational Therapy Treatment Patient Details Name: Thao Bauza MRN: [id number] DOB: [birth year] Today's Date: 06/12/2022   History of present illness 72 y.o. female who presents dehiscence and ulceration right transtibial amputation.  Patient has been admitted for medical conditions including rectal bleeding and C. difficile.  Patient is currently on dialysis. 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 Patient progressing with bed mobility and sitting balance on EOB. Patient performed lateral scooting on EOB but no transfer due to patient expected to leave for HD. Patient instructed in UE HEP with red therapy band. Acute OT to continue to follow with discharge recommendations for SNF for continued rehab to address functional transfers and self care and due to lack of help at home. Acute OT to continue to follow.    Recommendations for follow up therapy are one component of a multi-disciplinary discharge planning process, led by the attending physician.  Recommendations may be updated based on patient status, additional functional criteria and insurance authorization.    Follow Up Recommendations  Skilled nursing-short term rehab (<3 hours/day)     Assistance Recommended at Discharge Frequent or constant Supervision/Assistance  Patient can return home with the following  Two people to help with walking and/or transfers;Two people to help with bathing/dressing/bathroom   Equipment Recommendations  Wheelchair (measurements OT);Wheelchair cushion (measurements OT);Hospital bed (can defer to next facility)    Recommendations for Other Services      Precautions / Restrictions  Precautions Precautions: Fall;Other (comment) Precaution Comments: recent R BKA and CDIFF Required Braces or Orthoses: Other Brace Other Brace: R BKA limb protector- skin wrapped Restrictions Weight Bearing Restrictions: Yes RLE Weight Bearing: Non weight bearing       Mobility Bed Mobility Overal bed mobility: Needs Assistance Bed Mobility: Rolling, Sidelying to Sit, Sit to Supine Rolling: Supervision Sidelying to sit: Min guard, HOB elevated Supine to sit: Min guard     General bed mobility comments: able to roll to left for cleaning bottom and getting to EOB    Transfers Overall transfer level: Needs assistance Equipment used: None              Lateral/Scoot Transfers: Mod assist General transfer comment: performed lateral scooting to right and left with mod assist     Balance Overall balance assessment: Needs assistance Sitting-balance support: No upper extremity supported, Feet supported Sitting balance-Leahy Scale: Fair                                     ADL either performed or assessed with clinical judgement   ADL Overall ADL's : Needs assistance/impaired     Grooming: Wash/dry hands;Wash/dry face;Oral care;Set up;Sitting Grooming Details (indicate cue type and reason): on EOB     Lower Body Bathing: Maximal assistance;Bed level Lower Body Bathing Details (indicate cue type and reason): assisted patient with cleaning following bowel incontinence                            Extremity/Trunk Assessment              Vision  Perception     Praxis      Cognition Arousal/Alertness: Awake/alert Behavior During Therapy: WFL for tasks assessed/performed Overall Cognitive Status: Within Functional Limits for tasks assessed Area of Impairment: Awareness, Problem solving                             Problem Solving: Slow processing, Difficulty sequencing, Requires verbal cues          Exercises  Exercises: General Upper Extremity General Exercises - Upper Extremity Shoulder ABduction: Strengthening, Both, 10 reps, Theraband Theraband Level (Shoulder Abduction): Level 2 (Red) Elbow Flexion: Strengthening, 10 reps, Both, Theraband Theraband Level (Elbow Flexion): Level 2 (Red) Elbow Extension: Strengthening, Both, 10 reps, Theraband Theraband Level (Elbow Extension): Level 2 (Red)    Shoulder Instructions       General Comments      Pertinent Vitals/ Pain       Pain Assessment Pain Assessment: Faces Faces Pain Scale: Hurts little more Pain Location: buttocks/rectum Pain Descriptors / Indicators: Discomfort, Sharp Pain Intervention(s): Limited activity within patient's tolerance, Monitored during session, Repositioned  Home Living                                          Prior Functioning/Environment              Frequency  Min 2X/week        Progress Toward Goals  OT Goals(current goals can now be found in the care plan section)  Progress towards OT goals: Progressing toward goals  Acute Rehab OT Goals Patient Stated Goal: go home OT Goal Formulation: With patient Time For Goal Achievement: 06/21/22 Potential to Achieve Goals: Fair ADL Goals Pt Will Perform Grooming: sitting;with modified independence Pt Will Transfer to Toilet: with mod assist;with +2 assist;bedside commode Pt Will Perform Toileting - Clothing Manipulation and hygiene: with mod assist;sitting/lateral leans Pt/caregiver will Perform Home Exercise Program: Increased strength;Both right and left upper extremity Additional ADL Goal #1: Pt will complete bed mobility with supervision. Additional ADL Goal #2: Pt will participate in seated ADLs x 15 minutes at EOB without UE support.  Plan Discharge plan remains appropriate    Co-evaluation                 AM-PAC OT "6 Clicks" Daily Activity     Outcome Measure   Help from another person eating meals?:  None Help from another person taking care of personal grooming?: A Little Help from another person toileting, which includes using toliet, bedpan, or urinal?: Total Help from another person bathing (including washing, rinsing, drying)?: A Lot Help from another person to put on and taking off regular upper body clothing?: A Little Help from another person to put on and taking off regular lower body clothing?: Total 6 Click Score: 14    End of Session Equipment Utilized During Treatment: Oxygen (4 liters)  OT Visit Diagnosis: Muscle weakness (generalized) (M62.81);Other abnormalities of gait and mobility (R26.89)   Activity Tolerance Patient tolerated treatment well   Patient Left in bed;with call bell/phone within reach;with bed alarm set   Nurse Communication Mobility status        Time: 9924-2683 OT Time Calculation (min): 26 min  Charges: OT General Charges $OT Visit: 1 Visit OT Treatments $Self Care/Home Management : 8-22 mins $Therapeutic Exercise: 8-22 mins  Lodema Hong, OTA Acute  Rehabilitation Services  Office [phone]   Trixie Dredge 06/12/2022, 1:31 PM

## 2022-06-12 NOTE — Progress Notes (Signed)
PROGRESS NOTE    Susan [name]  GEX:[id number] DOB: [birth year] DOA: 06/05/2022 PCP: [name], MD    Brief Narrative:   Susan [name] is a 72 y.o. female with past medical history significant for HTN, HLD, proximal atrial fibrillation on anticoagulation, chronic diastolic CHF, CAD, ESRD on HD MWF, chronic respiratory failure on O2, DM2, hypothyroidism, history of GI bleed, history of recurrent C. difficile colitis who presented to [facility] ED on 1/11 with complaints of lower abdominal pain associate with nausea/vomiting.  Evaluation in the ED notable for severe constipation with stercoral colitis.  Patient also positive for C. difficile but likely colonization per recommendations of ID.  Patient was started on aggressive bowel regimen.  Patient received 3 days of Augmentin for sterile coral colitis and currently on 10-day course of oral vancomycin for prophylaxis for C. difficile.  Patient was also seen by orthopedics, Dr. 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  Recent admission from 12/28 until 1 8//2024 for acute lower GI bleed. GI tried to perform flexible sigmoidoscopy x 2 which were unsuccessful. General surgery was consulted and patient underwent exam under anesthesia with oversewing of rectal ulcer found on 05/24/2022 by Dr. Rich Number. She had a total of 4 units of packed red blood cell transfusion during that hospitalization and Eliquis was recommended to be discontinued.   Assessment & Plan:   Severe constipation Stecoral colitis, sigmoid diverticulitis C. difficile colitis, ruled out Patient to ED with abdominal pain.  CT Abdo/pelvis notable for large stool ball within the rectum with scattered rectal wall pneumatosis concerning for stercoral colitis, sigmoid diverticulosis with acute diverticulitis.  C. difficile was positive, seen by ID who does not think this has active C. difficile colitis.  General surgery was consulted and recommended antibiotics for CT findings.   Patient completed 3-day course of Augmentin. -- General surgery following, appreciate assistance -- Continue oral vancomycin for 10 days for prophylaxis for C. Difficile -- Colace 100 g p.o. twice daily -- Senna daily -- MiraLAX daily -- Continue to monitor bowel movements closely, 3 reported over the last 24 hours  Right BKA with wound dehiscence -- Orthopedics following, appreciate assistance -- Plan revision on 1/19; n.p.o. after midnight  Perisplenic collection CT abdomen/pelvis with contrast noted 6 x 4 x 2 cm complex subcapsular collection superior aspect of the spleen with concern for hematoma versus infectious process.  Seen by general surgery, believe possible spontaneous subcapsular hematoma.  Has been off of anticoagulation for greater than 2 weeks.  Low suspicion for infectious collection given no gas.  Hemoglobin stable.  -- General surgery following, appreciate assistance -- Continue to hold aspirin, DVT prophylaxis  Hx infected left AV fistula Patient underwent I&D by vascular surgery, Dr. Carlis Abbott on 05/05/2022. Vascular surgery consulted and sutures were removed to left forearm on 1/17.  Chronic respiratory failure, O2 dependent CT chest without contrast 1/16 with small left pleural effusion, compressive atelectasis left lower lobe, old granulomatous disease. -- Continue supplemental oxygen, maintain SpO2 greater than 92%, on 3 L nasal cannula which is her baseline -- Albuterol nebs as needed for shortness of breath/wheezing  Orthostatic hypotension/autonomic dysfunction Hx essential hypertension -- Midodrine 5 mg p.o. 3 times daily -- Midodrine 10 mg p.o. daily on Monday/Wednesday/Fridays with HD  DM2 Hemoglobin A1c 8.9 on 05/02/2022, not well-controlled.  Home regimen includes Lantus 10 units nightly, NovoLog sliding scale, Tradjenta. -- Semglee 30 units [place] qHS -- NovoLog 5 units 3 times daily AC -- Moderate SSI for  coverage -- CBGs qAC/HS  Hypothyroidism --  Levothyroxine 50 mcg p.o. daily  Paroxysmal atrial fibrillation -- Amiodarone 200 mg p.o. daily -- No on anticoagulation due to rectal ulcer as above  Depression -- Trintellix 20 mg p.o. every morning  DVT prophylaxis: Place and maintain sequential compression device Start: 06/06/22 0808    Code Status: Full Code Family Communication: No family present at bedside this morning  Disposition Plan:  Level of care: Telemetry Medical Status is: Inpatient Remains inpatient appropriate because: Pending revision of right BKA planned for tomorrow    Consultants:  General surgery Infection disease Orthopedics, Dr. Sharol Given Nephrology Vascular surgery  Procedures:  none  Antimicrobials:  Augmentin 1/13 - 1/15 Zosyn 1/11 - 1/12 Metronidazole 1/11 - 1/11 Oral vancomycin 1/11>>   Subjective: Patient seen examined bedside, resting comfortably.  Lying in bed.  Patient reported dyspnea overnight.  Had dialysis yesterday with 3 L UF.  Overnight covering provider ordered multiple tests including viral panel that was negative for flu, RSV, COVID, ABG that was unrevealing and CT chest with continued small left pleural effusion with left lower lobe atelectasis.  Nephrology plans repeat dialysis today in anticipation of surgical revision of her right BKA planned for tomorrow.  No other specific questions or concerns at this time.  Denies headache, no dizziness, no chest pain, no palpitations, no abdominal pain, no fever/chills/night sweats, no nausea/vomiting/diarrhea, no focal weakness, no fatigue, no paresthesias.  No acute events overnight per nursing staff.  Objective: Vitals:   06/11/22 1631 06/11/22 2037 06/12/22 0512 06/12/22 0940  BP: (!) 131/54 (!) 114/47 117/77 (!) 113/41  Pulse: 90 100 81 80  Resp: '16  13 19  '$ Temp: 98 F (36.7 C) 97.7 F (36.5 C) 98.3 F (36.8 C) 98.3 F (36.8 C)  TempSrc: Oral Oral Oral   SpO2: 100% 99% 100% 100%  Weight:   91.2 kg     Intake/Output Summary  (Last 24 hours) at 06/12/2022 1054 Last data filed at 06/12/2022 0800 Gross per 24 hour  Intake 1020 ml  Output 3000 ml  Net -1980 ml   Filed Weights   06/11/22 1051 06/11/22 1430 06/12/22 0512  Weight: 93.3 kg 90.8 kg 91.2 kg    Examination:  Physical Exam: GEN: NAD, alert and oriented x 3, chronically ill appearance HEENT: NCAT, PERRL, EOMI, sclera clear, MMM PULM: CTAB w/o wheezes/crackles, normal respiratory effort, on 3 L nasal cannula which is her baseline CV: RRR w/o M/G/R GI: abd soft, NTND, NABS, no R/G/M MSK: no peripheral edema, right BKA site noted with Ace wrap/dressing in place, clean/dry/intact NEURO: CN II-XII intact, no focal deficits, sensation to light touch intact PSYCH: normal mood/affect Integumentary: Right BKA site noted with dressing in place, no other concerning rashes/lesions/wounds noted on exposed skin surfaces.      Data Reviewed: I have personally reviewed following labs and imaging studies  CBC: Recent Labs  Lab 06/06/22 0533 06/07/22 0141 06/07/22 1426 06/08/22 0742 06/09/22 0332 06/10/22 0330  WBC 10.7* 9.6  --  7.5 7.4 7.0  HGB 8.3* 7.9* 8.7* 7.7* 8.0* 7.7*  HCT 26.5* 26.3* 27.9* 24.8* 27.2* 25.9*  MCV 87.5 88.0  --  87.3 89.2 87.5  PLT 187 201  --  186 203 073   Basic Metabolic Panel: Recent Labs  Lab 06/06/22 0533 06/07/22 0141 06/07/22 0934 06/08/22 0742 06/09/22 0332 06/10/22 0330  NA 130*  131* 129* 129* 129* 128* 130*  K 3.7  3.8 4.0 3.6 4.1 4.2 3.8  CL 93*  93* 94* 94* 96* 94* 95*  CO2 '26  26 28 27 25 23 29  '$ GLUCOSE 195*  193* 135* 112* 366* 328* 343*  BUN 38*  37* 18 20 30* 37* 27*  CREATININE 2.99*  3.00* 2.04* 2.25* 3.33* 3.88* 2.30*  CALCIUM 8.1*  8.1* 7.8* 7.9* 8.0* 8.4* 8.5*  MG 2.5*  --   --   --   --   --   PHOS 2.6 1.7*  --  3.4  --  2.6   GFR: Estimated Creatinine Clearance: 24.3 mL/min (A) (by C-G formula based on SCr of 2.3 mg/dL (H)). Liver Function Tests: Recent Labs  Lab 06/06/22 0533  06/07/22 0141 06/08/22 0742  ALBUMIN 1.6* 1.7* 1.8*   No results for input(s): "LIPASE", "AMYLASE" in the last 168 hours.  No results for input(s): "AMMONIA" in the last 168 hours. Coagulation Profile: No results for input(s): "INR", "PROTIME" in the last 168 hours. Cardiac Enzymes: No results for input(s): "CKTOTAL", "CKMB", "CKMBINDEX", "TROPONINI" in the last 168 hours. BNP (last 3 results) No results for input(s): "PROBNP" in the last 8760 hours. HbA1C: No results for input(s): "HGBA1C" in the last 72 hours. CBG: Recent Labs  Lab 06/11/22 0717 06/11/22 1631 06/11/22 2126 06/11/22 2318 06/12/22 0804  GLUCAP 223* 313* 310* 303* 266*   Lipid Profile: No results for input(s): "CHOL", "HDL", "LDLCALC", "TRIG", "CHOLHDL", "LDLDIRECT" in the last 72 hours. Thyroid Function Tests: No results for input(s): "TSH", "T4TOTAL", "FREET4", "T3FREE", "THYROIDAB" in the last 72 hours. Anemia Panel: No results for input(s): "VITAMINB12", "FOLATE", "FERRITIN", "TIBC", "IRON", "RETICCTPCT" in the last 72 hours. Sepsis Labs: No results for input(s): "PROCALCITON", "LATICACIDVEN" in the last 168 hours.  Recent Results (from the past 240 hour(s))  C Difficile Quick Screen w PCR reflex     Status: Abnormal   Collection Time: 06/05/22  9:03 AM   Specimen: STOOL  Result Value Ref Range Status   C Diff antigen POSITIVE (A) NEGATIVE Final   C Diff toxin NEGATIVE NEGATIVE Final   C Diff interpretation Results are indeterminate. See PCR results.  Final    Comment: Performed at [facility], [place] [address], [place], [place] [address]  C. Diff by PCR, Reflexed     Status: Abnormal   Collection Time: 06/05/22  9:03 AM  Result Value Ref Range Status   Toxigenic C. Difficile by PCR POSITIVE (A) NEGATIVE Final    Comment: Positive for toxigenic C. difficile with little to no toxin production. Only treat if clinical presentation suggests symptomatic illness. Performed at [facility], [place] [address], [place], [place] [address]   Blood culture (routine x 2)     Status: None   Collection Time: 06/05/22  9:21 AM   Specimen: BLOOD RIGHT ARM  Result Value Ref Range Status   Specimen Description BLOOD RIGHT ARM  Final   Special Requests   Final    BOTTLES DRAWN AEROBIC AND ANAEROBIC Blood Culture results may not be optimal due to an inadequate volume of blood received in culture bottles   Culture   Final    NO GROWTH 5 DAYS Performed at [facility], [place] [address], [place], [place] [address]    Report Status 06/10/2022 FINAL  Final  Resp panel by RT-PCR (RSV, Flu A&B, Covid) Anterior Nasal Swab     Status: None   Collection Time: 06/11/22 11:19 PM   Specimen: Anterior Nasal Swab  Result Value Ref Range Status   SARS  Coronavirus 2 by RT PCR NEGATIVE NEGATIVE Final    Comment: (NOTE) SARS-CoV-2 target nucleic acids are NOT DETECTED.  The SARS-CoV-2 RNA is generally detectable in upper respiratory specimens during the acute phase of infection. The lowest concentration of SARS-CoV-2 viral copies this assay can detect is 138 copies/mL. A negative result does not preclude SARS-Cov-2 infection and should not be used as the sole basis for treatment or other patient management decisions. A negative result may occur with  improper specimen collection/handling, submission of specimen other than nasopharyngeal swab, presence of viral mutation(s) within the areas targeted by this assay, and inadequate number of viral copies(<138 copies/mL). A negative result must be combined with clinical observations, patient history, and epidemiological information. The expected result is Negative.  Fact Sheet for Patients:  EntrepreneurPulse.com.au  Fact Sheet for Healthcare Providers:  IncredibleEmployment.be  This test is no t yet approved or cleared by the [place] FDA and  has been authorized for detection and/or diagnosis of SARS-CoV-2  by FDA under an Emergency Use Authorization (EUA). This EUA will remain  in effect (meaning this test can be used) for the duration of the COVID-19 declaration under Section 564(b)(1) of the Act, 21 U.S.C.section 360bbb-3(b)(1), unless the authorization is terminated  or revoked sooner.       Influenza A by PCR NEGATIVE NEGATIVE Final   Influenza B by PCR NEGATIVE NEGATIVE Final    Comment: (NOTE) The Xpert Xpress SARS-CoV-2/FLU/RSV plus assay is intended as an aid in the diagnosis of influenza from Nasopharyngeal swab specimens and should not be used as a sole basis for treatment. Nasal washings and aspirates are unacceptable for Xpert Xpress SARS-CoV-2/FLU/RSV testing.  Fact Sheet for Patients: EntrepreneurPulse.com.au  Fact Sheet for Healthcare Providers: IncredibleEmployment.be  This test is not yet approved or cleared by the [place] FDA and has been authorized for detection and/or diagnosis of SARS-CoV-2 by FDA under an Emergency Use Authorization (EUA). This EUA will remain in effect (meaning this test can be used) for the duration of the COVID-19 declaration under Section 564(b)(1) of the Act, 21 U.S.C. section 360bbb-3(b)(1), unless the authorization is terminated or revoked.     Resp Syncytial Virus by PCR NEGATIVE NEGATIVE Final    Comment: (NOTE) Fact Sheet for Patients: EntrepreneurPulse.com.au  Fact Sheet for Healthcare Providers: IncredibleEmployment.be  This test is not yet approved or cleared by the [place] FDA and has been authorized for detection and/or diagnosis of SARS-CoV-2 by FDA under an Emergency Use Authorization (EUA). This EUA will remain in effect (meaning this test can be used) for the duration of the COVID-19 declaration under Section 564(b)(1) of the Act, 21 U.S.C. section 360bbb-3(b)(1), unless the authorization is terminated or revoked.  Performed at  [facility], [place] [address], [place], [place] [address]          Radiology Studies: DG Abd Portable 1V  Result Date: 06/12/2022 CLINICAL DATA:  Encounter for constipation. EXAM: PORTABLE ABDOMEN - 1 VIEW COMPARISON:  Radiographs 06/10/2022 and 08/15/2020.  CT 06/05/2022. FINDINGS: 0438 hours. Two supine views of the abdomen are submitted. The bowel gas pattern appears normal. Colonic stool burden does not appear excessive. No supine evidence of bowel wall thickening or pneumoperitoneum. There are scattered vascular calcifications without suspicious abdominal calcifications. Mild spondylosis noted. IMPRESSION: No acute findings or radiographic evidence of significant constipation. Electronically Signed   By: Richardean Sale M.D.   On: 06/12/2022 08:16   CT CHEST WO CONTRAST  Result Date: 06/12/2022 CLINICAL DATA:  Respiratory illness, nondiagnostic x-ray. EXAM: CT CHEST WITHOUT CONTRAST TECHNIQUE: Multidetector CT imaging of the chest was performed following the standard protocol without IV contrast. RADIATION DOSE REDUCTION: This exam was performed according to the departmental dose-optimization program which includes automated exposure control, adjustment of the mA and/or kV according to patient size and/or use of iterative reconstruction technique. COMPARISON:  Chest radiographs 06/11/2022 and 06/07/2022. CT chest 06/10/2022 and abdominal CTA 05/23/2022. FINDINGS: Cardiovascular: Right IJ central venous catheter extends to the superior cavoatrial junction. Diffuse atherosclerosis of the aorta, great vessels and coronary arteries. The heart size is normal. There is no pericardial effusion. Mediastinum/Nodes: Again demonstrated are calcified subcarinal and right hilar lymph nodes and a mildly enlarged precarinal node measuring 1.5 cm short axis on image 55/3. No axillary adenopathy. The thyroid gland, trachea and esophagus demonstrate no significant findings. Lungs/Pleura: No significant  change in small left pleural effusion with adjacent compressive left lower lobe atelectasis. There is mildly increased subsegmental atelectasis at the right lung base. Underlying central airway thickening is present. There are stable scattered small calcified granulomas. No confluent airspace opacity or suspicious pulmonary nodule. Upper abdomen: Scattered calcified granulomas in the liver and spleen. Hypodense lesion involving the medial aspect of the spleen has enlarged from recent abdominal CTA and is now so seated with a probable complex fluid collection extending along the anteromedial aspect of the spleen, measuring up to 6.6 x 5.9 cm on image 123/3. There is no air within this collection or surrounding inflammatory change. Musculoskeletal/Chest wall: There is no chest wall mass or suspicious osseous finding. Level spondylosis. IMPRESSION: 1. No significant change in small left pleural effusion with adjacent compressive left lower lobe atelectasis. Mildly increased right basilar subsegmental atelectasis. 2. Enlarging hypodense lesion involving the medial aspect of the spleen with a probable complex fluid collection extending along the anteromedial aspect of the spleen. This could reflect a splenic abscess or subacute infarct with adjacent hematoma. No air within this collection or surrounding inflammatory changes. Consider further evaluation with ultrasound or follow-up abdominal CT with contrast. 3. Sequela of prior granulomatous disease with unchanged, nonspecific mildly enlarged precarinal lymph node. 4.  Aortic Atherosclerosis (ICD10-I70.0). Electronically Signed   By: Richardean Sale M.D.   On: 06/12/2022 08:13   DG CHEST PORT 1 VIEW  Result Date: 06/11/2022 CLINICAL DATA:  Shortness of breath.  [address] EXAM: PORTABLE CHEST 1 VIEW COMPARISON:  06/07/2022 FINDINGS: Right central venous catheter with tip over the low SVC region. No pneumothorax. Borderline heart size with normal pulmonary vascularity.  Lungs are clear. No pleural effusions. No pneumothorax. Mediastinal contours appear intact. Calcification of the aorta. Degenerative changes in the spine and shoulders. No change since prior study. IMPRESSION: No active disease. Electronically Signed   By: Lucienne Capers M.D.   On: 06/11/2022 23:03   CT CHEST WO CONTRAST  Result Date: 06/10/2022 CLINICAL DATA:  Chronic dyspnea, chest wall or pleural disease suspected EXAM: CT CHEST WITHOUT CONTRAST TECHNIQUE: Multidetector CT imaging of the chest was performed following the standard protocol without IV contrast. RADIATION DOSE REDUCTION: This exam was performed according to the departmental dose-optimization program which includes automated exposure control, adjustment of the mA and/or kV according to patient size and/or use of iterative reconstruction technique. COMPARISON:  12/10/2005 FINDINGS: Cardiovascular: RIGHT jugular line with tip at cavoatrial junction. Extensive atherosclerotic calcifications aorta, proximal great vessels and coronary arteries. Aorta normal caliber. Mild enlargement of cardiac chambers. No pericardial effusion. Mediastinum/Nodes: Esophagus unremarkable. Elongated RIGHT thyroid lobe. Enlarged precarinal lymph  node 14 mm image 54. Calcified subcarinal and RIGHT hilar nodes. Lungs/Pleura: Small LEFT pleural effusion. Compressive atelectasis LEFT lower lobe. Remaining lungs clear. No pulmonary infiltrate or pneumothorax. Calcified granuloma anteromedial RIGHT upper lobe. Upper Abdomen: Extensive atherosclerotic calcifications throughout celiac axis. Remaining visualized upper abdomen unremarkable. Musculoskeletal: No acute osseous findings. IMPRESSION: Small LEFT pleural effusion with compressive atelectasis of LEFT lower lobe. Old granulomatous disease. Extensive atherosclerotic calcifications including coronary arteries. Single nonspecific mildly enlarged precarinal lymph node 14 mm diameter. Aortic Atherosclerosis (ICD10-I70.0).  Electronically Signed   By: Lavonia Dana M.D.   On: 06/10/2022 13:49   DG Abd Portable 1V  Result Date: 06/10/2022 CLINICAL DATA:  Constipation EXAM: PORTABLE ABDOMEN - 1 VIEW COMPARISON:  X-ray 06/09/2022.  CT 06/05/2022 and older FINDINGS: Underpenetrated portable x-ray. Gas is seen in nondilated loops of small large bowel. Scattered mild colonic stool but less overall than previous. No frank obstruction. No obvious free air on this portable supine radiograph. Overlapping cardiac leads. IMPRESSION: Improving colonic stool.  Nonspecific bowel gas pattern Electronically Signed   By: Jill Side M.D.   On: 06/10/2022 12:25        Scheduled Meds:  (feeding supplement) PROSource Plus  30 mL Oral BID BM   amiodarone  200 mg Oral Daily   Chlorhexidine Gluconate Cloth  6 each Topical Q0600   docusate sodium  100 mg Oral BID   Gerhardt's butt cream   Topical TID   guaiFENesin  600 mg Oral BID   insulin aspart  0-15 Units Subcutaneous TID WC   insulin aspart  0-5 Units Subcutaneous QHS   insulin aspart  5 Units Subcutaneous TID WC   insulin glargine-yfgn  30 Units Subcutaneous QHS   levothyroxine  50 mcg Oral QAC breakfast   midodrine  10 mg Oral Q M,W,F-HD   midodrine  5 mg Oral TID WC   multivitamin  1 tablet Oral Daily   pantoprazole  40 mg Oral Daily   polyethylene glycol  17 g Oral Daily   pregabalin  75 mg Oral Daily   saccharomyces boulardii  500 mg Oral BID   senna  1 tablet Oral Daily   sodium chloride flush  3 mL Intravenous Q12H   vancomycin  125 mg Oral BID   vortioxetine HBr  20 mg Oral q AM   Continuous Infusions:   LOS: 7 days    Time spent: 50 minutes spent on chart review, discussion with nursing staff, consultants, updating family and interview/physical exam; more than 50% of that time was spent in counseling and/or coordination of care.    Diquan Kassis J [place] DO [facility] Available via Epic secure chat 7am-7pm After these hours, please refer to coverage  provider listed on [email or web address] 06/12/2022, 10:54 AM

## 2022-06-13 ENCOUNTER — Inpatient Hospital Stay (HOSPITAL_COMMUNITY): Payer: PPO | Admitting: Certified Registered Nurse Anesthetist

## 2022-06-13 ENCOUNTER — Encounter (HOSPITAL_COMMUNITY): Payer: Self-pay | Admitting: Internal Medicine

## 2022-06-13 ENCOUNTER — Other Ambulatory Visit: Payer: Self-pay

## 2022-06-13 ENCOUNTER — Inpatient Hospital Stay (HOSPITAL_COMMUNITY): Payer: PPO

## 2022-06-13 ENCOUNTER — Encounter (HOSPITAL_COMMUNITY)
Admission: EM | Disposition: A | Discharge status: Skilled Nursing Facility | Payer: Self-pay | Source: Skilled Nursing Facility | Attending: Internal Medicine

## 2022-06-13 DIAGNOSIS — D631 Anemia in chronic kidney disease: Secondary | ICD-10-CM

## 2022-06-13 DIAGNOSIS — N186 End stage renal disease: Secondary | ICD-10-CM

## 2022-06-13 DIAGNOSIS — T8781 Dehiscence of amputation stump: Secondary | ICD-10-CM | POA: Diagnosis not present

## 2022-06-13 DIAGNOSIS — K5792 Diverticulitis of intestine, part unspecified, without perforation or abscess without bleeding: Secondary | ICD-10-CM | POA: Diagnosis not present

## 2022-06-13 DIAGNOSIS — E1122 Type 2 diabetes mellitus with diabetic chronic kidney disease: Secondary | ICD-10-CM

## 2022-06-13 DIAGNOSIS — Z992 Dependence on renal dialysis: Secondary | ICD-10-CM

## 2022-06-13 DIAGNOSIS — I25119 Atherosclerotic heart disease of native coronary artery with unspecified angina pectoris: Secondary | ICD-10-CM

## 2022-06-13 DIAGNOSIS — I132 Hypertensive heart and chronic kidney disease with heart failure and with stage 5 chronic kidney disease, or end stage renal disease: Secondary | ICD-10-CM

## 2022-06-13 DIAGNOSIS — I509 Heart failure, unspecified: Secondary | ICD-10-CM

## 2022-06-13 HISTORY — PX: APPLICATION OF WOUND VAC: SHX5189

## 2022-06-13 HISTORY — PX: STUMP REVISION: SHX6102

## 2022-06-13 LAB — CBC
HCT: 27.7 % — ABNORMAL LOW (ref 36.0–46.0)
Hemoglobin: 8.2 g/dL — ABNORMAL LOW (ref 12.0–15.0)
MCH: 26.3 pg (ref 26.0–34.0)
MCHC: 29.6 g/dL — ABNORMAL LOW (ref 30.0–36.0)
MCV: 88.8 fL (ref 80.0–100.0)
Platelets: 196 10*3/uL (ref 150–400)
RBC: 3.12 MIL/uL — ABNORMAL LOW (ref 3.87–5.11)
RDW: 18.7 % — ABNORMAL HIGH (ref 11.5–15.5)
WBC: 5.7 10*3/uL (ref 4.0–10.5)
nRBC: 0 % (ref 0.0–0.2)

## 2022-06-13 LAB — RENAL FUNCTION PANEL
Albumin: 2.1 g/dL — ABNORMAL LOW (ref 3.5–5.0)
Anion gap: 7 (ref 5–15)
BUN: 27 mg/dL — ABNORMAL HIGH (ref 8–23)
CO2: 29 mmol/L (ref 22–32)
Calcium: 8.8 mg/dL — ABNORMAL LOW (ref 8.9–10.3)
Chloride: 98 mmol/L (ref 98–111)
Creatinine, Ser: 2.15 mg/dL — ABNORMAL HIGH (ref 0.44–1.00)
GFR, Estimated: 24 mL/min — ABNORMAL LOW (ref >60)
Glucose, Bld: 106 mg/dL — ABNORMAL HIGH (ref 70–99)
Phosphorus: 3.5 mg/dL (ref 2.5–4.6)
Potassium: 4.1 mmol/L (ref 3.5–5.1)
Sodium: 134 mmol/L — ABNORMAL LOW (ref 135–145)

## 2022-06-13 LAB — GLUCOSE, CAPILLARY
Glucose-Capillary: 99 mg/dL (ref 70–99)
Glucose-Capillary: 114 mg/dL — ABNORMAL HIGH (ref 70–99)
Glucose-Capillary: 170 mg/dL — ABNORMAL HIGH (ref 70–99)
Glucose-Capillary: 164 mg/dL — ABNORMAL HIGH (ref 70–99)
Glucose-Capillary: 331 mg/dL — ABNORMAL HIGH (ref 70–99)

## 2022-06-13 SURGERY — REVISION, AMPUTATION SITE
Anesthesia: General | Site: Leg Lower | Laterality: Right

## 2022-06-13 MED ORDER — OXYCODONE HCL 5 MG PO TABS: 5.0000 mg | ORAL_TABLET | Freq: Once | ORAL | Status: DC | PRN

## 2022-06-13 MED ORDER — ONDANSETRON HCL 4 MG/2ML IJ SOLN
4.0000 mg | Freq: Four times a day (QID) | INTRAMUSCULAR | Status: DC | PRN
  Filled 2022-06-13: qty 2

## 2022-06-13 MED ORDER — CHLORHEXIDINE GLUCONATE 0.12 % MT SOLN
OROMUCOSAL | Status: AC
  Administered 2022-06-13: 15 mL via OROMUCOSAL
  Filled 2022-06-13: qty 15

## 2022-06-13 MED ORDER — DEXAMETHASONE SODIUM PHOSPHATE 10 MG/ML IJ SOLN
INTRAMUSCULAR | Status: DC | PRN
  Administered 2022-06-13: 5 mg via INTRAVENOUS

## 2022-06-13 MED ORDER — METOPROLOL TARTRATE 5 MG/5ML IV SOLN: 2.0000 mg | INTRAVENOUS | Status: DC | PRN

## 2022-06-13 MED ORDER — INSULIN GLARGINE-YFGN 100 UNIT/ML ~~LOC~~ SOLN
30.0000 [IU] | Freq: Every day | SUBCUTANEOUS | Status: DC
  Administered 2022-06-13 – 2022-06-14 (×2): 30 [IU] via SUBCUTANEOUS
  Filled 2022-06-13 (×6): qty 0.3

## 2022-06-13 MED ORDER — FENTANYL CITRATE (PF) 250 MCG/5ML IJ SOLN
INTRAMUSCULAR | Status: DC | PRN
  Administered 2022-06-13 (×2): 50 ug via INTRAVENOUS

## 2022-06-13 MED ORDER — HYDROMORPHONE HCL 1 MG/ML IJ SOLN
INTRAMUSCULAR | Status: AC
  Filled 2022-06-13: qty 2

## 2022-06-13 MED ORDER — INSULIN ASPART 100 UNIT/ML IJ SOLN
8.0000 [IU] | Freq: Three times a day (TID) | INTRAMUSCULAR | Status: DC
  Administered 2022-06-14 – 2022-06-16 (×7): 8 [IU] via SUBCUTANEOUS

## 2022-06-13 MED ORDER — OXYCODONE HCL 5 MG PO TABS
10.0000 mg | ORAL_TABLET | ORAL | Status: DC | PRN
  Administered 2022-06-14 – 2022-06-17 (×12): 15 mg via ORAL
  Filled 2022-06-13 (×12): qty 3

## 2022-06-13 MED ORDER — ACETAMINOPHEN 325 MG PO TABS: 325.0000 mg | ORAL_TABLET | Freq: Four times a day (QID) | ORAL | Status: DC | PRN

## 2022-06-13 MED ORDER — ORAL CARE MOUTH RINSE: 15.0000 mL | Freq: Once | OROMUCOSAL | Status: AC

## 2022-06-13 MED ORDER — PROPOFOL 10 MG/ML IV BOLUS
INTRAVENOUS | Status: DC | PRN
  Administered 2022-06-13: 140 mg via INTRAVENOUS

## 2022-06-13 MED ORDER — DOCUSATE SODIUM 100 MG PO CAPS
100.0000 mg | ORAL_CAPSULE | Freq: Every day | ORAL | Status: DC
  Administered 2022-06-14 – 2022-06-15 (×2): 100 mg via ORAL
  Filled 2022-06-13 (×3): qty 1

## 2022-06-13 MED ORDER — BISACODYL 5 MG PO TBEC: 5.0000 mg | DELAYED_RELEASE_TABLET | Freq: Every day | ORAL | Status: DC | PRN

## 2022-06-13 MED ORDER — JUVEN PO PACK
1.0000 | PACK | Freq: Two times a day (BID) | ORAL | Status: DC
  Administered 2022-06-14 – 2022-06-17 (×7): 1 via ORAL
  Filled 2022-06-13 (×7): qty 1

## 2022-06-13 MED ORDER — PANTOPRAZOLE SODIUM 40 MG PO TBEC: 40.0000 mg | DELAYED_RELEASE_TABLET | Freq: Every day | ORAL | Status: DC

## 2022-06-13 MED ORDER — LORAZEPAM 0.5 MG PO TABS
0.5000 mg | ORAL_TABLET | Freq: Three times a day (TID) | ORAL | Status: DC | PRN
  Administered 2022-06-14 – 2022-06-16 (×3): 0.5 mg via ORAL
  Filled 2022-06-13 (×3): qty 1

## 2022-06-13 MED ORDER — GUAIFENESIN-DM 100-10 MG/5ML PO SYRP
15.0000 mL | ORAL_SOLUTION | ORAL | Status: DC | PRN
  Administered 2022-06-14 – 2022-06-16 (×6): 15 mL via ORAL
  Filled 2022-06-13 (×6): qty 15

## 2022-06-13 MED ORDER — CEFAZOLIN SODIUM-DEXTROSE 2-4 GM/100ML-% IV SOLN
2.0000 g | Freq: Three times a day (TID) | INTRAVENOUS | Status: AC
  Administered 2022-06-13 – 2022-06-14 (×2): 2 g via INTRAVENOUS
  Filled 2022-06-13 (×2): qty 100

## 2022-06-13 MED ORDER — LACTATED RINGERS IV SOLN
INTRAVENOUS | Status: DC | PRN
Start: 2022-06-13 — End: 2022-06-13

## 2022-06-13 MED ORDER — OXYCODONE HCL 5 MG PO TABS
5.0000 mg | ORAL_TABLET | ORAL | Status: DC | PRN
  Administered 2022-06-13: 10 mg via ORAL
  Filled 2022-06-13: qty 2

## 2022-06-13 MED ORDER — ONDANSETRON HCL 4 MG/2ML IJ SOLN
INTRAMUSCULAR | Status: DC | PRN
  Administered 2022-06-13: 4 mg via INTRAVENOUS

## 2022-06-13 MED ORDER — LABETALOL HCL 5 MG/ML IV SOLN: 10.0000 mg | INTRAVENOUS | Status: DC | PRN

## 2022-06-13 MED ORDER — MAGNESIUM CITRATE PO SOLN: 1.0000 | Freq: Once | ORAL | Status: DC | PRN

## 2022-06-13 MED ORDER — OXYCODONE HCL 5 MG/5ML PO SOLN: 5.0000 mg | Freq: Once | ORAL | Status: DC | PRN

## 2022-06-13 MED ORDER — LIDOCAINE 2% (20 MG/ML) 5 ML SYRINGE
INTRAMUSCULAR | Status: DC | PRN
  Administered 2022-06-13: 80 mg via INTRAVENOUS

## 2022-06-13 MED ORDER — MAGNESIUM SULFATE 2 GM/50ML IV SOLN: 2.0000 g | Freq: Every day | INTRAVENOUS | Status: DC | PRN

## 2022-06-13 MED ORDER — INSULIN GLARGINE-YFGN 100 UNIT/ML ~~LOC~~ SOLN
40.0000 [IU] | Freq: Every day | SUBCUTANEOUS | Status: DC
  Filled 2022-06-13: qty 0.4

## 2022-06-13 MED ORDER — HYDROMORPHONE HCL 1 MG/ML IJ SOLN
0.5000 mg | INTRAMUSCULAR | Status: DC | PRN
  Administered 2022-06-13: 1 mg via INTRAVENOUS
  Administered 2022-06-13: 0.5 mg via INTRAVENOUS
  Administered 2022-06-14 – 2022-06-17 (×4): 1 mg via INTRAVENOUS
  Filled 2022-06-13 (×6): qty 1

## 2022-06-13 MED ORDER — CEFAZOLIN SODIUM-DEXTROSE 2-4 GM/100ML-% IV SOLN
INTRAVENOUS | Status: AC
  Filled 2022-06-13: qty 100

## 2022-06-13 MED ORDER — SODIUM CHLORIDE 0.9 % IV SOLN: INTRAVENOUS | Status: DC

## 2022-06-13 MED ORDER — HYDROMORPHONE HCL 1 MG/ML IJ SOLN
0.2500 mg | INTRAMUSCULAR | Status: DC | PRN
  Administered 2022-06-13 (×2): 0.25 mg via INTRAVENOUS
  Administered 2022-06-13 (×2): 0.5 mg via INTRAVENOUS

## 2022-06-13 MED ORDER — FENTANYL CITRATE (PF) 250 MCG/5ML IJ SOLN
INTRAMUSCULAR | Status: AC
  Filled 2022-06-13: qty 5

## 2022-06-13 MED ORDER — VITAMIN C 500 MG PO TABS
1000.0000 mg | ORAL_TABLET | Freq: Every day | ORAL | Status: DC
  Administered 2022-06-13 – 2022-06-17 (×5): 1000 mg via ORAL
  Filled 2022-06-13 (×5): qty 2

## 2022-06-13 MED ORDER — POTASSIUM CHLORIDE CRYS ER 20 MEQ PO TBCR: 20.0000 meq | EXTENDED_RELEASE_TABLET | Freq: Every day | ORAL | Status: DC | PRN

## 2022-06-13 MED ORDER — 0.9 % SODIUM CHLORIDE (POUR BTL) OPTIME
TOPICAL | Status: DC | PRN
  Administered 2022-06-13: 1000 mL

## 2022-06-13 MED ORDER — ALUM & MAG HYDROXIDE-SIMETH 200-200-20 MG/5ML PO SUSP: 15.0000 mL | ORAL | Status: DC | PRN

## 2022-06-13 MED ORDER — ZINC SULFATE 220 (50 ZN) MG PO CAPS
220.0000 mg | ORAL_CAPSULE | Freq: Every day | ORAL | Status: DC
  Administered 2022-06-13 – 2022-06-17 (×5): 220 mg via ORAL
  Filled 2022-06-13 (×5): qty 1

## 2022-06-13 MED ORDER — POLYETHYLENE GLYCOL 3350 17 G PO PACK: 17.0000 g | PACK | Freq: Every day | ORAL | Status: DC | PRN

## 2022-06-13 MED ORDER — HYDRALAZINE HCL 20 MG/ML IJ SOLN: 5.0000 mg | INTRAMUSCULAR | Status: DC | PRN

## 2022-06-13 MED ORDER — PHENOL 1.4 % MT LIQD: 1.0000 | OROMUCOSAL | Status: DC | PRN

## 2022-06-13 MED ORDER — CHLORHEXIDINE GLUCONATE 0.12 % MT SOLN
OROMUCOSAL | Status: AC
  Filled 2022-06-13: qty 15

## 2022-06-13 MED ORDER — CHLORHEXIDINE GLUCONATE 0.12 % MT SOLN: 15.0000 mL | Freq: Once | OROMUCOSAL | Status: AC

## 2022-06-13 SURGICAL SUPPLY — 32 items
BAG COUNTER SPONGE SURGICOUNT (BAG) ×2 IMPLANT
BLADE SAW RECIP 87.9 MT (BLADE) IMPLANT
BLADE SURG 21 STRL SS (BLADE) ×2 IMPLANT
BNDG COHESIVE 6X5 TAN NS LF (GAUZE/BANDAGES/DRESSINGS) IMPLANT
CANISTER WOUND CARE 500ML ATS (WOUND CARE) ×2 IMPLANT
COVER SURGICAL LIGHT HANDLE (MISCELLANEOUS) ×2 IMPLANT
DRAPE EXTREMITY T 121X128X90 (DISPOSABLE) ×2 IMPLANT
DRAPE HALF SHEET 40X57 (DRAPES) ×2 IMPLANT
DRAPE INCISE IOBAN 66X45 STRL (DRAPES) ×2 IMPLANT
DRAPE U-SHAPE 47X51 STRL (DRAPES) ×4 IMPLANT
DRESSING PREVENA PLUS CUSTOM (GAUZE/BANDAGES/DRESSINGS) ×2 IMPLANT
DRSG PREVENA PLUS CUSTOM (GAUZE/BANDAGES/DRESSINGS) ×2
DURAPREP 26ML APPLICATOR (WOUND CARE) ×2 IMPLANT
ELECT REM PT RETURN 9FT ADLT (ELECTROSURGICAL) ×2
ELECTRODE REM PT RTRN 9FT ADLT (ELECTROSURGICAL) ×2 IMPLANT
GLOVE BIOGEL PI IND STRL 9 (GLOVE) ×2 IMPLANT
GLOVE SURG ORTHO 9.0 STRL STRW (GLOVE) ×2 IMPLANT
GOWN STRL REUS W/ TWL XL LVL3 (GOWN DISPOSABLE) ×4 IMPLANT
GOWN STRL REUS W/TWL XL LVL3 (GOWN DISPOSABLE) ×4
GRAFT SKIN WND MICRO 38 (Tissue) IMPLANT
KIT BASIN OR (CUSTOM PROCEDURE TRAY) ×2 IMPLANT
KIT TURNOVER KIT B (KITS) ×2 IMPLANT
MANIFOLD NEPTUNE II (INSTRUMENTS) ×2 IMPLANT
NS IRRIG 1000ML POUR BTL (IV SOLUTION) ×2 IMPLANT
PACK GENERAL/GYN (CUSTOM PROCEDURE TRAY) ×2 IMPLANT
PAD ARMBOARD 7.5X6 YLW CONV (MISCELLANEOUS) ×2 IMPLANT
PREVENA RESTOR ARTHOFORM 46X30 (CANNISTER) ×2 IMPLANT
STAPLER VISISTAT 35W (STAPLE) IMPLANT
SUT ETHILON 2 0 PSLX (SUTURE) ×4 IMPLANT
SUT SILK 2 0 (SUTURE)
SUT SILK 2-0 18XBRD TIE 12 (SUTURE) IMPLANT
TOWEL GREEN STERILE (TOWEL DISPOSABLE) ×2 IMPLANT

## 2022-06-13 NOTE — Progress Notes (Signed)
Left upper extremity duplex dialysis access study completed.   Please see CV Proc for preliminary results.   Darlin Coco, RDMS, RVT

## 2022-06-13 NOTE — TOC Progression Note (Signed)
Transition of Care [facility]) - Initial/Assessment Note    Patient Details  Name: Susan [name] MRN: [id number] Date of Birth: 1951/03/21  Transition of Care [facility]) CM/SW Contact:    Milinda Antis, [place] Phone Number: 06/13/2022, 5:04 PM  Clinical Narrative:                 LCSW contacted [facility] and rehab and informed facility that per MD, patient may be ready to return on Monday.    TOC following.    Expected Discharge Plan: Skilled Nursing Facility Barriers to Discharge: Continued Medical Work up, Ship broker   Patient Goals and CMS Choice Patient states their goals for this hospitalization and ongoing recovery are:: To return to SNF CMS Medicare.gov Compare Post Acute Care list provided to:: Patient Choice offered to / list presented to : Patient      Expected Discharge Plan and Services In-house Referral: Clinical Social Work   Post Acute Care Choice: [place] Living arrangements for the past 2 months: [place]                                      Prior Living Arrangements/Services Living arrangements for the past 2 months: [place] Lives with:: Facility Resident Patient language and need for interpreter reviewed:: Yes Do you feel safe going back to the place where you live?: Yes      Need for Family Participation in Patient Care: Yes (Comment) Care giver support system in place?: Yes (comment)   Criminal Activity/Legal Involvement Pertinent to Current Situation/Hospitalization: No - Comment as needed  Activities of Daily Living Home Assistive Devices/Equipment: Wheelchair ADL Screening (condition at time of admission) Patient's cognitive ability adequate to safely complete daily activities?: Yes Is the patient deaf or have difficulty hearing?: No Does the patient have difficulty seeing, even when wearing glasses/contacts?: No Does the patient have difficulty concentrating, remembering,  or making decisions?: No Patient able to express need for assistance with ADLs?: Yes Does the patient have difficulty dressing or bathing?: Yes Independently performs ADLs?: No Does the patient have difficulty walking or climbing stairs?: Yes Weakness of Legs: Both Weakness of Arms/Hands: None  Permission Sought/Granted   Permission granted to share information with : Yes, Verbal Permission Granted     Permission granted to share info w AGENCY: SNF        Emotional Assessment Appearance:: Appears older than stated age Attitude/Demeanor/Rapport: Engaged Affect (typically observed): Appropriate Orientation: : Oriented to Situation, Oriented to  Time, Oriented to Place, Oriented to Self Alcohol / Substance Use: Not Applicable Psych Involvement: No (comment)  Admission diagnosis:  Diverticulitis [K57.92] Spleen hematoma, initial encounter [S36.029A] Patient Active Problem List   Diagnosis Date Noted   Constipation 06/07/2022   History of Clostridioides difficile colitis 06/06/2022   Below-knee amputation of right lower extremity ([place]) 06/06/2022   Diverticulitis 06/05/2022   Stercoral colitis 06/05/2022   C. difficile colitis 06/05/2022   Spleen hematoma 06/05/2022   Dehiscence of amputation stump of right lower extremity ([place]) 06/05/2022   Rectal ulcer 05/27/2022   ESRD on dialysis ([place]) 05/27/2022   GI bleed 05/23/2022   Difficult intravenous access 05/23/2022   Gangrene of right foot ([place]) 05/02/2022   S/P BKA (below knee amputation) unilateral, right ([place]) 05/02/2022   Unspecified protein-calorie malnutrition ([place]) 04/15/2022   Secondary hyperparathyroidism of renal origin ([place]) 04/14/2022   Coagulation defect, unspecified (  [place]) 04/09/2022   Acquired absence of other left toe(s) ([place]) 04/07/2022   Allergy, unspecified, initial encounter 04/07/2022   Dependence on renal dialysis ([place]) 04/07/2022   Gout due to renal impairment, unspecified site 04/07/2022   Hypertensive  heart and chronic kidney disease with heart failure and with stage 5 chronic kidney disease, or end stage renal disease ([place]) 04/07/2022   Personal history of transient ischemic attack (TIA), and cerebral infarction without residual deficits 04/07/2022   Renal osteodystrophy 04/07/2022   Venous stasis ulcer of right calf ([place]) 03/31/2022   Fistula, colovaginal 03/26/2022   Diarrhea 03/26/2022   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)    Bacteremia    Acute pancreatitis 02/01/2022   Abdominal pain 02/01/2022   SIRS (systemic inflammatory response syndrome) ([place]) 02/01/2022   Transaminitis 02/01/2022   History of anemia due to chronic kidney disease 02/01/2022   Paroxysmal atrial fibrillation ([place]) 02/01/2022   DKA (diabetic ketoacidosis) ([place]) 01/14/2022   NSTEMI (non-ST elevated myocardial infarction) ([place]) 03/05/2021   Acute renal failure superimposed on stage 4 chronic kidney disease ([place]) 08/22/2020   Hypoalbuminemia 05/25/2020   GERD (gastroesophageal reflux disease) 05/25/2020   Pressure injury of skin 05/17/2020   Type 2 diabetes mellitus with diabetic polyneuropathy, with long-term current use of insulin ([place]) 03/07/2020   Obesity, Class III, BMI 40-49.9 (morbid obesity) ([place]) 03/07/2020   Common bile duct (CBD) obstruction 05/28/2019   Benign neoplasm of ascending colon    Benign neoplasm of transverse colon    Benign neoplasm of descending colon    Benign neoplasm of sigmoid colon    Gastric polyps    Hyperkalemia 03/11/2019   Prolonged QT interval 03/11/2019   Acute blood loss anemia 03/11/2019   Onychomycosis 06/21/2018   Osteomyelitis of second toe of right foot ([place])    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 (HCC)    PVD (peripheral vascular disease) ([place]) 10/26/2017   E-coli UTI 07/27/2017   Hypothyroidism 07/27/2017   AKI (acute kidney injury) ([place])    Jamul (pulmonary artery hypertension) ([place])     Impaired ambulation 07/19/2017   Nausea & vomiting 07/15/2017   Leg cramps 02/27/2017   Peripheral edema 01/12/2017   Diabetic neuropathy ([place]) 11/12/2016   CKD (chronic kidney disease), stage IV ([place]) 10/24/2015   Anemia of chronic disease 10/03/2015   Generalized anxiety disorder 10/03/2015   Insomnia 10/03/2015   Hyperglycemia due to diabetes mellitus ([place]) 06/07/2015   Chronic diastolic CHF (congestive heart failure) ([place]) 06/07/2015   Non compliance with medical treatment 04/17/2014   Rotator cuff tear 03/14/2014   Class 3 obesity ([place]) 09/23/2013   Chronic HFrEF (heart failure with reduced ejection fraction) ([place]) 06/03/2013   Hypotension 12/25/2012   Urinary incontinence    MDD (major depressive disorder) 11/12/2010   RBBB (right bundle branch block)    Coronary artery disease    Hyperlipemia 01/22/2009   Essential hypertension 01/22/2009   PCP:  [name], MD Pharmacy:   [id number] [place] [id number]: [phone]: [phone]    Social Determinants of Health (SDOH) Social History: SDOH Screenings   Food Insecurity: No Food Insecurity (05/26/2022)  Housing: Low Risk  (05/26/2022)  Transportation Needs: No Transportation Needs (05/26/2022)  Utilities: Not At Risk (05/26/2022)  Alcohol Screen: Low Risk  (08/16/2021)  Depression (PHQ2-9): Low Risk  (04/23/2022)  Financial  Resource Strain: Low Risk  (08/16/2021)  Physical Activity: Inactive (08/16/2021)  Social Connections: Moderately Isolated (08/16/2021)  Stress: No Stress Concern Present (08/16/2021)  Tobacco Use: Medium Risk (06/13/2022)   SDOH Interventions:     Readmission Risk Interventions    05/12/2022    5:04 PM 02/07/2022    2:57 PM 01/23/2022   12:38 PM  Readmission Risk Prevention Plan  Transportation Screening Complete Complete Complete  PCP or Specialist Appt within 3-5 Days   Complete   HRI or [place]   Complete  Social Work Consult for [place] Planning/Counseling   Complete  Palliative Care Screening   Not Applicable  Medication Review Press photographer)  Complete Complete  PCP or Specialist appointment within 3-5 days of discharge  Complete   HRI or [place]  Complete   SW Recovery Care/Counseling Consult Complete Complete   Palliative Care Screening  Not [place] Complete Not Applicable

## 2022-06-13 NOTE — Progress Notes (Signed)
Late entry: RN called report to OR, transporter at bedside to take pt

## 2022-06-13 NOTE — Transfer of Care (Signed)
3Immediate Anesthesia Transfer of Care Note  Patient: Susan [name]  Procedure(s) Performed: REVISION RIGHT BELOW KNEE AMPUTATION (Right) APPLICATION OF WOUND VAC (Right: Leg Lower)  Patient Location: PACU  Anesthesia Type:General  Level of Consciousness: awake and alert   Airway & Oxygen Therapy: Patient Spontanous Breathing and Patient connected to nasal cannula oxygen  Post-op Assessment: Report given to RN and Post -op Vital signs reviewed and stable  Post vital signs: Reviewed and stable  Last Vitals:  Vitals Value Taken Time  BP 128/50 06/13/22 1622  Temp    Pulse 74 06/13/22 1624  Resp 9 06/13/22 1624  SpO2 98 % 06/13/22 1624  Vitals shown include unvalidated device data.  Last Pain:  Vitals:   06/13/22 1423  TempSrc: Oral  PainSc: 3       Patients Stated Pain Goal: 0 (44/73/95 [id number])  Complications: No notable events documented.

## 2022-06-13 NOTE — Interval H&P Note (Signed)
History and Physical Interval Note:  06/13/2022 6:44 AM  Susan [name]  has presented today for surgery, with the diagnosis of Dehiscence Right Below Knee Amputation.  The various methods of treatment have been discussed with the patient and family. After consideration of risks, benefits and other options for treatment, the patient has consented to  Procedure(s): REVISION RIGHT BELOW KNEE AMPUTATION (Right) as a surgical intervention.  The patient's history has been reviewed, patient examined, no change in status, stable for surgery.  I have reviewed the patient's chart and labs.  Questions were answered to the patient's satisfaction.     Newt Minion

## 2022-06-13 NOTE — Inpatient Diabetes Management (Signed)
Inpatient Diabetes Program Recommendations  AACE/ADA: New Consensus Statement on Inpatient Glycemic Control (2015)  Target Ranges:  Prepandial:   less than 140 mg/dL      Peak postprandial:   less than 180 mg/dL (1-2 hours)      Critically ill patients:  140 - 180 mg/dL   Lab Results  Component Value Date   GLUCAP 99 06/13/2022   HGBA1C 8.9 (H) 05/02/2022    Latest Reference Range & Units 06/12/22 08:04 06/12/22 11:14 06/12/22 16:18 06/12/22 20:10 06/13/22 07:47  Glucose-Capillary 70 - 99 mg/dL 266 (H) Novolog 13 units 310 (H) 322 (H) Novolog 16 units 225 (H) Novolog 2 units 99  (H): Data is abnormally high  Review of Glycemic Control  Diabetes history: DM  Outpatient Diabetes medications:  Omnipod insulin pump (total basal 25.9 units/day, Carb coverage 1:12 grams, Sensitivity factor 1:40 mg/dl ) not on insulin pump at SNF Current orders for Inpatient glycemic control:   Semglee 30 units qhs Novolog 0-15 units tid + hs Novolog 5 units tid meal coverage  Inpatient Diabetes Program Recommendations:   Fasting CBG 99. -Decrease Semglee to 30 units When eating post-op, consider: -Increase Novolog meal coverage to 8 units if eats 50% or more due to postprandial CBGs elevated.  Thank you, Nani Gasser. Haider Hornaday, RN, MSN, CDE  Diabetes Coordinator Inpatient Glycemic Control Team Team Pager [phone] (8am-5pm) 06/13/2022 10:15 AM

## 2022-06-13 NOTE — Anesthesia Preprocedure Evaluation (Signed)
Anesthesia Evaluation  Patient identified by MRN, date of birth, ID band Patient awake    Reviewed: Allergy & Precautions, H&P , NPO status , Patient's Chart, lab work & pertinent test results, reviewed documented beta blocker date and time   History of Anesthesia Complications (+) PONV and history of anesthetic complications  Airway Mallampati: II  TM Distance: >3 FB Neck ROM: Full    Dental no notable dental hx. (+) Teeth Intact, Dental Advisory Given   Pulmonary shortness of breath and with exertion, former smoker   Pulmonary exam normal breath sounds clear to auscultation       Cardiovascular Exercise Tolerance: Poor hypertension, Pt. on medications + angina with exertion + CAD, + Past MI, + Peripheral Vascular Disease and +CHF  + dysrhythmias Atrial Fibrillation  Rhythm:Regular Rate:Normal     Neuro/Psych  Headaches PSYCHIATRIC DISORDERS Anxiety Depression     Neuromuscular disease CVA    GI/Hepatic Neg liver ROS, PUD,GERD  Medicated and Controlled,,C. Diff colitis   Endo/Other  diabetes, Poorly Controlled, Type 2, Insulin DependentHypothyroidism  Morbid obesity  Renal/GU ESRF and DialysisRenal disease  negative genitourinary   Musculoskeletal  (+) Arthritis , Osteoarthritis,  Dehiscence right BKA   Abdominal  (+) + obese  Peds  Hematology  (+) Blood dyscrasia, anemia   Anesthesia Other Findings   Reproductive/Obstetrics negative OB ROS                              Anesthesia Physical Anesthesia Plan  ASA: 4  Anesthesia Plan: General   Post-op Pain Management: Minimal or no pain anticipated and Dilaudid IV   Induction: Intravenous  PONV Risk Score and Plan: 4 or greater and 3 and Ondansetron  Airway Management Planned: LMA  Additional Equipment: None  Intra-op Plan:   Post-operative Plan: Extubation in OR  Informed Consent: I have reviewed the patients History and  Physical, chart, labs and discussed the procedure including the risks, benefits and alternatives for the proposed anesthesia with the patient or authorized representative who has indicated his/her understanding and acceptance.     Dental advisory given  Plan Discussed with: CRNA  Anesthesia Plan Comments:         Anesthesia Quick Evaluation

## 2022-06-13 NOTE — Progress Notes (Signed)
PROGRESS NOTE    [name]  YCX:[id number] DOB: [birth year] DOA: 06/05/2022 PCP: [name], MD    Brief Narrative:   Susan [name] is a 72 y.o. female with past medical history significant for HTN, HLD, proximal atrial fibrillation on anticoagulation, chronic diastolic CHF, CAD, ESRD on HD MWF, chronic respiratory failure on O2, DM2, hypothyroidism, history of GI bleed, history of recurrent C. difficile colitis who presented to [facility] ED on 1/11 with complaints of lower abdominal pain associate with nausea/vomiting.  Evaluation in the ED notable for severe constipation with stercoral colitis.  Patient also positive for C. difficile but likely colonization per recommendations of ID.  Patient was started on aggressive bowel regimen.  Patient received 3 days of Augmentin for sterile coral colitis and currently on 10-day course of oral vancomycin for prophylaxis for C. difficile.  Patient was also seen by orthopedics, Dr. 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  Recent admission from 12/28 until 1 8//2024 for acute lower GI bleed. GI tried to perform flexible sigmoidoscopy x 2 which were unsuccessful. General surgery was consulted and patient underwent exam under anesthesia with oversewing of rectal ulcer found on 05/24/2022 by Dr. Rich Number. She had a total of 4 units of packed red blood cell transfusion during that hospitalization and Eliquis was recommended to be discontinued.   Assessment & Plan:   Severe constipation Stecoral colitis, sigmoid diverticulitis C. difficile colitis, ruled out Patient to ED with abdominal pain.  CT Abdo/pelvis notable for large stool ball within the rectum with scattered rectal wall pneumatosis concerning for stercoral colitis, sigmoid diverticulosis with acute diverticulitis.  C. difficile was positive, seen by ID who does not think this has active C. difficile colitis.  General surgery was consulted and recommended antibiotics for CT findings.   Patient completed 3-day course of Augmentin. -- General surgery following, appreciate assistance -- Continue oral vancomycin for 10 days for prophylaxis for C. Difficile; day #9/10 -- Colace 100 g p.o. twice daily -- Senna daily -- MiraLAX daily -- Continue to monitor bowel movements closely, 3 reported over the last 24 hours  Right BKA with wound dehiscence -- Orthopedics following, appreciate assistance -- Plan revision on today, n.p.o.  Perisplenic collection CT abdomen/pelvis with contrast noted 6 x 4 x 2 cm complex subcapsular collection superior aspect of the spleen with concern for hematoma versus infectious process.  Seen by general surgery, believe possible spontaneous subcapsular hematoma.  Has been off of anticoagulation for greater than 2 weeks.  Low suspicion for infectious collection given no gas.  Hemoglobin stable.  -- General surgery following, appreciate assistance -- Continue to hold aspirin, DVT prophylaxis  Hx infected left AV fistula Patient underwent I&D by vascular surgery, Dr. Carlis Abbott on 05/05/2022. Vascular surgery consulted and sutures were removed to left forearm on 1/17.  Chronic respiratory failure, O2 dependent CT chest without contrast 1/16 with small left pleural effusion, compressive atelectasis left lower lobe, old granulomatous disease. -- Continue supplemental oxygen, maintain SpO2 greater than 92%, on 3 L nasal cannula which is her baseline -- Albuterol nebs as needed for shortness of breath/wheezing  Orthostatic hypotension/autonomic dysfunction Hx essential hypertension -- Midodrine 5 mg p.o. 3 times daily -- Midodrine 10 mg p.o. daily on Monday/Wednesday/Fridays with HD  DM2 Hemoglobin A1c 8.9 on 05/02/2022, not well-controlled.  Home regimen includes Lantus 10 units nightly, NovoLog sliding scale, Tradjenta. -- Semglee 30 units [name] qHS -- NovoLog 8 units 3 times daily AC -- Moderate SSI for  coverage -- CBGs qAC/HS  Hypothyroidism --  Levothyroxine 50 mcg p.o. daily  Paroxysmal atrial fibrillation -- Amiodarone 200 mg p.o. daily -- No on anticoagulation due to rectal ulcer as above  Anxiety/depression -- Trintellix 20 mg p.o. every morning -- Ativan 0.5 mg p.o. every 8 hours as needed anxiety  DVT prophylaxis: Place and maintain sequential compression device Start: 06/06/22 0808    Code Status: Full Code Family Communication: No family present at bedside this morning  Disposition Plan:  Level of care: Telemetry Medical Status is: Inpatient Remains inpatient appropriate because: Pending revision of right BKA planned for today    Consultants:  General surgery Infection disease Orthopedics, Dr. Sharol Given Nephrology Vascular surgery  Procedures:  none  Antimicrobials:  Augmentin 1/13 - 1/15 Zosyn 1/11 - 1/12 Metronidazole 1/11 - 1/11 Oral vancomycin 1/11>>   Subjective: Patient seen examined bedside, resting comfortably.  Lying in bed.  RN present.  Reports anxiety overnight received Xanax.  Requesting antianxiolytic to use as needed.  Had dialysis yesterday.  Plan right BKA revision by orthopedics today.  No other specific questions or concerns at this time. Denies headache, no dizziness, no chest pain, no palpitations, no abdominal pain, no fever/chills/night sweats, no nausea/vomiting/diarrhea, no focal weakness, no fatigue, no paresthesias.  No acute events overnight per nursing staff.  Objective: Vitals:   06/13/22 0457 06/13/22 0524 06/13/22 0700 06/13/22 0932  BP:  (!) 110/56  (!) 93/46  Pulse:  72  78  Resp:  18    Temp:  98 F (36.7 C)  98.1 F (36.7 C)  TempSrc:  Oral    SpO2:  100%  99%  Weight:   93.3 kg   Height: '5\' 5"'$  (1.651 m)       Intake/Output Summary (Last 24 hours) at 06/13/2022 1045 Last data filed at 06/13/2022 0800 Gross per 24 hour  Intake 420 ml  Output 0 ml  Net 420 ml   Filed Weights   06/12/22 1143 06/12/22 1501 06/13/22 0700  Weight: 93.1 kg 91.1 kg 93.3 kg     Examination:  Physical Exam: GEN: NAD, alert and oriented x 3, chronically ill appearance HEENT: NCAT, PERRL, EOMI, sclera clear, MMM PULM: CTAB w/o wheezes/crackles, normal respiratory effort, on 4 L nasal cannula  CV: RRR w/o M/G/R GI: abd soft, NTND, NABS, no R/G/M MSK: no peripheral edema, right BKA site noted with Ace wrap/dressing in place, clean/dry/intact NEURO: CN II-XII intact, no focal deficits, sensation to light touch intact PSYCH: normal mood/affect Integumentary: Right BKA site noted with dressing in place, no other concerning rashes/lesions/wounds noted on exposed skin surfaces.      Data Reviewed: I have personally reviewed following labs and imaging studies  CBC: Recent Labs  Lab 06/08/22 0742 06/09/22 0332 06/10/22 0330 06/12/22 1152 06/13/22 0708  WBC 7.5 7.4 7.0 6.2 5.7  HGB 7.7* 8.0* 7.7* 7.5* 8.2*  HCT 24.8* 27.2* 25.9* 24.9* 27.7*  MCV 87.3 89.2 87.5 88.3 88.8  PLT 186 203 199 191 892   Basic Metabolic Panel: Recent Labs  Lab 06/07/22 0141 06/07/22 0934 06/08/22 0742 06/09/22 0332 06/10/22 0330 06/12/22 1152 06/13/22 0708  NA 129*   < > 129* 128* 130* 129* 134*  K 4.0   < > 4.1 4.2 3.8 4.5 4.1  CL 94*   < > 96* 94* 95* 95* 98  CO2 28   < > '25 23 29 26 29  '$ GLUCOSE 135*   < > 366* 328* 343* 329* 106*  BUN 18   < >  30* 37* 27* 31* 27*  CREATININE 2.04*   < > 3.33* 3.88* 2.30* 2.73* 2.15*  CALCIUM 7.8*   < > 8.0* 8.4* 8.5* 8.5* 8.8*  PHOS 1.7*  --  3.4  --  2.6 3.1 3.5   < > = values in this interval not displayed.   GFR: Estimated Creatinine Clearance: 26.3 mL/min (A) (by C-G formula based on SCr of 2.15 mg/dL (H)). Liver Function Tests: Recent Labs  Lab 06/07/22 0141 06/08/22 0742 06/12/22 1152 06/13/22 0708  ALBUMIN 1.7* 1.8* 2.0* 2.1*   No results for input(s): "LIPASE", "AMYLASE" in the last 168 hours.  No results for input(s): "AMMONIA" in the last 168 hours. Coagulation Profile: No results for input(s): "INR",  "PROTIME" in the last 168 hours. Cardiac Enzymes: No results for input(s): "CKTOTAL", "CKMB", "CKMBINDEX", "TROPONINI" in the last 168 hours. BNP (last 3 results) No results for input(s): "PROBNP" in the last 8760 hours. HbA1C: No results for input(s): "HGBA1C" in the last 72 hours. CBG: Recent Labs  Lab 06/12/22 1114 06/12/22 1618 06/12/22 2010 06/13/22 0747 06/13/22 1030  GLUCAP 310* 322* 225* 99 114*   Lipid Profile: No results for input(s): "CHOL", "HDL", "LDLCALC", "TRIG", "CHOLHDL", "LDLDIRECT" in the last 72 hours. Thyroid Function Tests: No results for input(s): "TSH", "T4TOTAL", "FREET4", "T3FREE", "THYROIDAB" in the last 72 hours. Anemia Panel: No results for input(s): "VITAMINB12", "FOLATE", "FERRITIN", "TIBC", "IRON", "RETICCTPCT" in the last 72 hours. Sepsis Labs: No results for input(s): "PROCALCITON", "LATICACIDVEN" in the last 168 hours.  Recent Results (from the past 240 hour(s))  C Difficile Quick Screen w PCR reflex     Status: Abnormal   Collection Time: 06/05/22  9:03 AM   Specimen: STOOL  Result Value Ref Range Status   C Diff antigen POSITIVE (A) NEGATIVE Final   C Diff toxin NEGATIVE NEGATIVE Final   C Diff interpretation Results are indeterminate. See PCR results.  Final    Comment: Performed at [facility], [place] [address], [place], [place] [address]  C. Diff by PCR, Reflexed     Status: Abnormal   Collection Time: 06/05/22  9:03 AM  Result Value Ref Range Status   Toxigenic C. Difficile by PCR POSITIVE (A) NEGATIVE Final    Comment: Positive for toxigenic C. difficile with little to no toxin production. Only treat if clinical presentation suggests symptomatic illness. Performed at [facility], [place] [address], [place], [place] [address]   Blood culture (routine x 2)     Status: None   Collection Time: 06/05/22  9:21 AM   Specimen: BLOOD RIGHT ARM  Result Value Ref Range Status   Specimen Description BLOOD RIGHT ARM  Final   Special  Requests   Final    BOTTLES DRAWN AEROBIC AND ANAEROBIC Blood Culture results may not be optimal due to an inadequate volume of blood received in culture bottles   Culture   Final    NO GROWTH 5 DAYS Performed at [facility], [place] [address] [place], [place] [address]    Report Status 06/10/2022 FINAL  Final  Resp panel by RT-PCR (RSV, Flu A&B, Covid) Anterior Nasal Swab     Status: None   Collection Time: 06/11/22 11:19 PM   Specimen: Anterior Nasal Swab  Result Value Ref Range Status   SARS Coronavirus 2 by RT PCR NEGATIVE NEGATIVE Final    Comment: (NOTE) SARS-CoV-2 target nucleic acids are NOT DETECTED.  The SARS-CoV-2 RNA is generally detectable in upper respiratory  specimens during the acute phase of infection. The lowest concentration of SARS-CoV-2 viral copies this assay can detect is 138 copies/mL. A negative result does not preclude SARS-Cov-2 infection and should not be used as the sole basis for treatment or other patient management decisions. A negative result may occur with  improper specimen collection/handling, submission of specimen other than nasopharyngeal swab, presence of viral mutation(s) within the areas targeted by this assay, and inadequate number of viral copies(<138 copies/mL). A negative result must be combined with clinical observations, patient history, and epidemiological information. The expected result is Negative.  Fact Sheet for Patients:  EntrepreneurPulse.com.au  Fact Sheet for Healthcare Providers:  IncredibleEmployment.be  This test is no t yet approved or cleared by the [place] FDA and  has been authorized for detection and/or diagnosis of SARS-CoV-2 by FDA under an Emergency Use Authorization (EUA). This EUA will remain  in effect (meaning this test can be used) for the duration of the COVID-19 declaration under Section 564(b)(1) of the Act, 21 U.S.C.section 360bbb-3(b)(1), unless the  authorization is terminated  or revoked sooner.       Influenza A by PCR NEGATIVE NEGATIVE Final   Influenza B by PCR NEGATIVE NEGATIVE Final    Comment: (NOTE) The Xpert Xpress SARS-CoV-2/FLU/RSV plus assay is intended as an aid in the diagnosis of influenza from Nasopharyngeal swab specimens and should not be used as a sole basis for treatment. Nasal washings and aspirates are unacceptable for Xpert Xpress SARS-CoV-2/FLU/RSV testing.  Fact Sheet for Patients: EntrepreneurPulse.com.au  Fact Sheet for Healthcare Providers: IncredibleEmployment.be  This test is not yet approved or cleared by the [place] FDA and has been authorized for detection and/or diagnosis of SARS-CoV-2 by FDA under an Emergency Use Authorization (EUA). This EUA will remain in effect (meaning this test can be used) for the duration of the COVID-19 declaration under Section 564(b)(1) of the Act, 21 U.S.C. section 360bbb-3(b)(1), unless the authorization is terminated or revoked.     Resp Syncytial Virus by PCR NEGATIVE NEGATIVE Final    Comment: (NOTE) Fact Sheet for Patients: EntrepreneurPulse.com.au  Fact Sheet for Healthcare Providers: IncredibleEmployment.be  This test is not yet approved or cleared by the [place] FDA and has been authorized for detection and/or diagnosis of SARS-CoV-2 by FDA under an Emergency Use Authorization (EUA). This EUA will remain in effect (meaning this test can be used) for the duration of the COVID-19 declaration under Section 564(b)(1) of the Act, 21 U.S.C. section 360bbb-3(b)(1), unless the authorization is terminated or revoked.  Performed at [facility], [place] [address] [place], [place] [address]          Radiology Studies: DG Abd Portable 1V  Result Date: 06/12/2022 CLINICAL DATA:  Encounter for constipation. EXAM: PORTABLE ABDOMEN - 1 VIEW COMPARISON:  Radiographs  06/10/2022 and 08/15/2020.  CT 06/05/2022. FINDINGS: 0438 hours. Two supine views of the abdomen are submitted. The bowel gas pattern appears normal. Colonic stool burden does not appear excessive. No supine evidence of bowel wall thickening or pneumoperitoneum. There are scattered vascular calcifications without suspicious abdominal calcifications. Mild spondylosis noted. IMPRESSION: No acute findings or radiographic evidence of significant constipation. Electronically Signed   By: Richardean Sale M.D.   On: 06/12/2022 08:16   CT CHEST WO CONTRAST  Result Date: 06/12/2022 CLINICAL DATA:  Respiratory illness, nondiagnostic x-ray. EXAM: CT CHEST WITHOUT CONTRAST TECHNIQUE: Multidetector CT imaging of the chest was performed following the standard protocol without IV contrast. RADIATION DOSE REDUCTION: This  exam was performed according to the departmental dose-optimization program which includes automated exposure control, adjustment of the mA and/or kV according to patient size and/or use of iterative reconstruction technique. COMPARISON:  Chest radiographs 06/11/2022 and 06/07/2022. CT chest 06/10/2022 and abdominal CTA 05/23/2022. FINDINGS: Cardiovascular: Right IJ central venous catheter extends to the superior cavoatrial junction. Diffuse atherosclerosis of the aorta, great vessels and coronary arteries. The heart size is normal. There is no pericardial effusion. Mediastinum/Nodes: Again demonstrated are calcified subcarinal and right hilar lymph nodes and a mildly enlarged precarinal node measuring 1.5 cm short axis on image 55/3. No axillary adenopathy. The thyroid gland, trachea and esophagus demonstrate no significant findings. Lungs/Pleura: No significant change in small left pleural effusion with adjacent compressive left lower lobe atelectasis. There is mildly increased subsegmental atelectasis at the right lung base. Underlying central airway thickening is present. There are stable scattered small  calcified granulomas. No confluent airspace opacity or suspicious pulmonary nodule. Upper abdomen: Scattered calcified granulomas in the liver and spleen. Hypodense lesion involving the medial aspect of the spleen has enlarged from recent abdominal CTA and is now so seated with a probable complex fluid collection extending along the anteromedial aspect of the spleen, measuring up to 6.6 x 5.9 cm on image 123/3. There is no air within this collection or surrounding inflammatory change. Musculoskeletal/Chest wall: There is no chest wall mass or suspicious osseous finding. Level spondylosis. IMPRESSION: 1. No significant change in small left pleural effusion with adjacent compressive left lower lobe atelectasis. Mildly increased right basilar subsegmental atelectasis. 2. Enlarging hypodense lesion involving the medial aspect of the spleen with a probable complex fluid collection extending along the anteromedial aspect of the spleen. This could reflect a splenic abscess or subacute infarct with adjacent hematoma. No air within this collection or surrounding inflammatory changes. Consider further evaluation with ultrasound or follow-up abdominal CT with contrast. 3. Sequela of prior granulomatous disease with unchanged, nonspecific mildly enlarged precarinal lymph node. 4.  Aortic Atherosclerosis (ICD10-I70.0). Electronically Signed   By: Richardean Sale M.D.   On: 06/12/2022 08:13   DG CHEST PORT 1 VIEW  Result Date: 06/11/2022 CLINICAL DATA:  Shortness of breath.  [address] EXAM: PORTABLE CHEST 1 VIEW COMPARISON:  06/07/2022 FINDINGS: Right central venous catheter with tip over the low SVC region. No pneumothorax. Borderline heart size with normal pulmonary vascularity. Lungs are clear. No pleural effusions. No pneumothorax. Mediastinal contours appear intact. Calcification of the aorta. Degenerative changes in the spine and shoulders. No change since prior study. IMPRESSION: No active disease. Electronically Signed    By: Lucienne Capers M.D.   On: 06/11/2022 23:03        Scheduled Meds:  (feeding supplement) PROSource Plus  30 mL Oral BID BM   amiodarone  200 mg Oral Daily   Chlorhexidine Gluconate Cloth  6 each Topical Q0600   docusate sodium  100 mg Oral BID   Gerhardt's butt cream   Topical TID   guaiFENesin  600 mg Oral BID   insulin aspart  0-15 Units Subcutaneous TID WC   insulin aspart  0-5 Units Subcutaneous QHS   insulin aspart  8 Units Subcutaneous TID WC   insulin glargine-yfgn  30 Units Subcutaneous QHS   levothyroxine  50 mcg Oral QAC breakfast   midodrine  10 mg Oral Q M,W,F-HD   midodrine  5 mg Oral TID WC   multivitamin  1 tablet Oral Daily   pantoprazole  40 mg Oral Daily   polyethylene glycol  17 g Oral Daily   pregabalin  75 mg Oral Daily   saccharomyces boulardii  500 mg Oral BID   senna  1 tablet Oral Daily   sodium chloride flush  3 mL Intravenous Q12H   vancomycin  125 mg Oral BID   vortioxetine HBr  20 mg Oral q AM   Continuous Infusions:   ceFAZolin (ANCEF) IV     tranexamic acid       LOS: 8 days    Time spent: 50 minutes spent on chart review, discussion with nursing staff, consultants, updating family and interview/physical exam; more than 50% of that time was spent in counseling and/or coordination of care.    Fariha Goto J [place] DO [facility] Available via Epic secure chat 7am-7pm After these hours, please refer to coverage provider listed on [email or web address] 06/13/2022, 10:45 AM

## 2022-06-13 NOTE — Progress Notes (Signed)
[name] Progress Note   Subjective:   Patient seen and examined at bedside.  Reports panic attack last night which resolved with medication and she was able to sleep all night.  Continues to have intermittent SOB.  Tolerated dialysis until the end when she had nausea, which is a chronic occurrence since starting dialysis.  Net UF 2L.  Admits to nausea now.  Denies CP, abdominal pain and v/d.  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   Objective Vitals:   06/12/22 2007 06/13/22 0457 06/13/22 0524 06/13/22 0700  BP: (!) 102/46  (!) 110/56   Pulse: 73  72   Resp: 18  18   Temp: 98.4 F (36.9 C)  98 F (36.7 C)   TempSrc: Oral  Oral   SpO2: 100%  100%   Weight:    93.3 kg  Height:  '5\' 5"'$  (1.651 m)     Physical Exam General:chronically ill appearing female in NAD Heart:RRR, no mrg Lungs:mostly CTAB, +rhonchi in RLL Abdomen:soft, NTND Extremities:R BKA, 1+ LLE edema Dialysis Access: R IJ TDC, LU AVF +b/t   Filed Weights   06/12/22 1143 06/12/22 1501 06/13/22 0700  Weight: 93.1 kg 91.1 kg 93.3 kg    Intake/Output Summary (Last 24 hours) at 06/13/2022 1004 Last data filed at 06/13/2022 0800 Gross per 24 hour  Intake 420 ml  Output 0 ml  Net 420 ml    Additional Objective Labs: Basic Metabolic Panel: Recent Labs  Lab 06/10/22 0330 06/12/22 1152 06/13/22 0708  NA 130* 129* 134*  K 3.8 4.5 4.1  CL 95* 95* 98  CO2 '29 26 29  '$ GLUCOSE 343* 329* 106*  BUN 27* 31* 27*  CREATININE 2.30* 2.73* 2.15*  CALCIUM 8.5* 8.5* 8.8*  PHOS 2.6 3.1 3.5   Liver Function Tests: Recent Labs  Lab 06/08/22 0742 06/12/22 1152 06/13/22 0708  ALBUMIN 1.8* 2.0* 2.1*   CBC: Recent Labs  Lab 06/08/22 0742 06/09/22 0332 06/10/22 0330 06/12/22 1152 06/13/22 0708  WBC 7.5 7.4 7.0 6.2 5.7  HGB 7.7* 8.0* 7.7* 7.5* 8.2*  HCT 24.8* 27.2* 25.9* 24.9* 27.7*  MCV 87.3 89.2 87.5 88.3 88.8  PLT 186 203 199 191 196   Blood Culture    Component Value Date/Time   SDES BLOOD  RIGHT ARM 06/05/2022 0921   SPECREQUEST  06/05/2022 0921    BOTTLES DRAWN AEROBIC AND ANAEROBIC Blood Culture results may not be optimal due to an inadequate volume of blood received in culture bottles   CULT  06/05/2022 0921    NO GROWTH 5 DAYS Performed at [facility], [address], [place], [place] [address]    REPTSTATUS 06/10/2022 FINAL 06/05/2022 1027    Studies/Results: DG Abd Portable 1V  Result Date: 06/12/2022 CLINICAL DATA:  Encounter for constipation. EXAM: PORTABLE ABDOMEN - 1 VIEW COMPARISON:  Radiographs 06/10/2022 and 08/15/2020.  CT 06/05/2022. FINDINGS: 0438 hours. Two supine views of the abdomen are submitted. The bowel gas pattern appears normal. Colonic stool burden does not appear excessive. No supine evidence of bowel wall thickening or pneumoperitoneum. There are scattered vascular calcifications without suspicious abdominal calcifications. Mild spondylosis noted. IMPRESSION: No acute findings or radiographic evidence of significant constipation. Electronically Signed   By: Richardean Sale M.D.   On: 06/12/2022 08:16   CT CHEST WO CONTRAST  Result Date: 06/12/2022 CLINICAL DATA:  Respiratory illness, nondiagnostic x-ray. EXAM: CT CHEST WITHOUT CONTRAST TECHNIQUE: Multidetector CT imaging of the chest was performed following the standard protocol  without IV contrast. RADIATION DOSE REDUCTION: This exam was performed according to the departmental dose-optimization program which includes automated exposure control, adjustment of the mA and/or kV according to patient size and/or use of iterative reconstruction technique. COMPARISON:  Chest radiographs 06/11/2022 and 06/07/2022. CT chest 06/10/2022 and abdominal CTA 05/23/2022. FINDINGS: Cardiovascular: Right IJ central venous catheter extends to the superior cavoatrial junction. Diffuse atherosclerosis of the aorta, great vessels and coronary arteries. The heart size is normal. There is no pericardial effusion.  Mediastinum/Nodes: Again demonstrated are calcified subcarinal and right hilar lymph nodes and a mildly enlarged precarinal node measuring 1.5 cm short axis on image 55/3. No axillary adenopathy. The thyroid gland, trachea and esophagus demonstrate no significant findings. Lungs/Pleura: No significant change in small left pleural effusion with adjacent compressive left lower lobe atelectasis. There is mildly increased subsegmental atelectasis at the right lung base. Underlying central airway thickening is present. There are stable scattered small calcified granulomas. No confluent airspace opacity or suspicious pulmonary nodule. Upper abdomen: Scattered calcified granulomas in the liver and spleen. Hypodense lesion involving the medial aspect of the spleen has enlarged from recent abdominal CTA and is now so seated with a probable complex fluid collection extending along the anteromedial aspect of the spleen, measuring up to 6.6 x 5.9 cm on image 123/3. There is no air within this collection or surrounding inflammatory change. Musculoskeletal/Chest wall: There is no chest wall mass or suspicious osseous finding. Level spondylosis. IMPRESSION: 1. No significant change in small left pleural effusion with adjacent compressive left lower lobe atelectasis. Mildly increased right basilar subsegmental atelectasis. 2. Enlarging hypodense lesion involving the medial aspect of the spleen with a probable complex fluid collection extending along the anteromedial aspect of the spleen. This could reflect a splenic abscess or subacute infarct with adjacent hematoma. No air within this collection or surrounding inflammatory changes. Consider further evaluation with ultrasound or follow-up abdominal CT with contrast. 3. Sequela of prior granulomatous disease with unchanged, nonspecific mildly enlarged precarinal lymph node. 4.  Aortic Atherosclerosis (ICD10-I70.0). Electronically Signed   By: Richardean Sale M.D.   On: 06/12/2022  08:13   DG CHEST PORT 1 VIEW  Result Date: 06/11/2022 CLINICAL DATA:  Shortness of breath.  [address] EXAM: PORTABLE CHEST 1 VIEW COMPARISON:  06/07/2022 FINDINGS: Right central venous catheter with tip over the low SVC region. No pneumothorax. Borderline heart size with normal pulmonary vascularity. Lungs are clear. No pleural effusions. No pneumothorax. Mediastinal contours appear intact. Calcification of the aorta. Degenerative changes in the spine and shoulders. No change since prior study. IMPRESSION: No active disease. Electronically Signed   By: Lucienne Capers M.D.   On: 06/11/2022 23:03    Medications:   ceFAZolin (ANCEF) IV     tranexamic acid      (feeding supplement) PROSource Plus  30 mL Oral BID BM   amiodarone  200 mg Oral Daily   Chlorhexidine Gluconate Cloth  6 each Topical Q0600   docusate sodium  100 mg Oral BID   Gerhardt's butt cream   Topical TID   guaiFENesin  600 mg Oral BID   insulin aspart  0-15 Units Subcutaneous TID WC   insulin aspart  0-5 Units Subcutaneous QHS   insulin aspart  8 Units Subcutaneous TID WC   insulin glargine-yfgn  40 Units Subcutaneous QHS   levothyroxine  50 mcg Oral QAC breakfast   midodrine  10 mg Oral Q M,W,F-HD   midodrine  5 mg Oral TID WC   multivitamin  1 tablet Oral Daily   pantoprazole  40 mg Oral Daily   polyethylene glycol  17 g Oral Daily   pregabalin  75 mg Oral Daily   saccharomyces boulardii  500 mg Oral BID   senna  1 tablet Oral Daily   sodium chloride flush  3 mL Intravenous Q12H   vancomycin  125 mg Oral BID   vortioxetine HBr  20 mg Oral q AM    Dialysis Orders: SW MWF  4h  400/800   97kg   3K/2.5Ca bath  TDC  Hep none - last HD 1/10, post wt 98.8 - mircera 150 mcg q2, last 1/08, due 1/22   Assessment/Plan: Abd pain/ stercoral proctitis w/o evidence of perfortaion/ acute diverticulitis - per abd CT. Hx of recent Cdif infection. Po vanc started for suspected Cdif, gen surg and ID consulted. No emergent surgical  needs. On Augmentin Perisplenic collection - etiology unclear. Per [facility] unlikely infectious, ?infarct vs delayed bleed vs trauma.  R BKA Dehiscence - Followed by Dr. Sharol Given: plan for revision today.  DM2 - uncont, per pmd ESRD - on HD MWF. Had HD 1/17, Off schedule yesterday d/t plan for surgery/SOB.  Likely will need HD again tomorrow, will reassess in the morning.  On midodrine HTN/ volume - sig LE / hip edema on exam. On O2. BP's are soft at admit but now stable. Continue max UF as tolerated. Under edw if weights correct, will need lower dry on d/c.  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 Transfuse prn.  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  Holding binders d/t low phos on admit. Not on vdra. Monitor trend.  Nutrition - Albumin 1.7, add prosource. Renal diet w/fluid restrictions.  PAF - no longer on a/c due to hx of GIB H/o PAD - sp R BKA Recent bleeding rectal ulcer - rx'd surgically dec 2023  Susan Mow, PA-C [facility] 06/13/2022,10:04 AM  LOS: 8 days

## 2022-06-13 NOTE — Op Note (Signed)
06/13/2022  4:26 PM  PATIENT:  Susan [name]    PRE-OPERATIVE DIAGNOSIS:  Dehiscence Right Below Knee Amputation  POST-OPERATIVE DIAGNOSIS:  Same  PROCEDURE:  REVISION RIGHT BELOW KNEE AMPUTATION, APPLICATION OF WOUND VAC Application Kerecis micro graft 38 cm.  SURGEON:  Newt Minion, MD  PHYSICIAN ASSISTANT:None ANESTHESIA:   General  PREOPERATIVE INDICATIONS:  Susan [name] is a  72 y.o. female with a diagnosis of Dehiscence Right Below Knee Amputation who failed conservative measures and elected for surgical management.    The risks benefits and alternatives were discussed with the patient preoperatively including but not limited to the risks of infection, bleeding, nerve injury, cardiopulmonary complications, the need for revision surgery, among others, and the patient was willing to proceed.  OPERATIVE IMPLANTS: Kerecis micro graft 38 cm.  '@ENCIMAGES'$ @  OPERATIVE FINDINGS: Patient has a tenuous soft tissue envelope.  OPERATIVE PROCEDURE: Patient brought the operating room and underwent a general anesthetic.  After adequate levels anesthesia were obtained patient's right lower extremity was prepped using DuraPrep draped into a sterile field a timeout was called.  A fishmouth incision was made proximal to the necrotic wound dehiscence.  This was carried down to bone.  The distal 2 cm of the tibia and fibula were resected and beveled anteriorly.  The vessels were suture-ligated with 2-0 silk electrocautery was used for further hemostasis.  The wound was irrigated with normal saline hemostasis was obtained.  The wound bed was filled with 38 cm of Kerecis micro graft.  The deep superficial fascial layers and skin was closed using 2-0 nylon.  The wound was covered with a circumferential wound VAC customizable.  This had a good suction fit patient was extubated taken the PACU in stable condition.   DISCHARGE PLANNING:  Antibiotic duration: Continue antibiotics for 24 hours for the  leg.  Weightbearing: Nonweightbearing on the right  Pain medication: Opioid pathway  Dressing care/ Wound VAC: Continue wound VAC for 1 week  Ambulatory devices: Wheelchair or walker  Discharge to: Discharge planning based on therapy recommendations.  Follow-up: In the office 1 week post operative.

## 2022-06-13 NOTE — Anesthesia Postprocedure Evaluation (Addendum)
Anesthesia Post Note  Patient: Susan [name]  Procedure(s) Performed: REVISION RIGHT BELOW KNEE AMPUTATION (Right) APPLICATION OF WOUND VAC (Right: Leg Lower)     Patient location during evaluation: PACU Anesthesia Type: General Level of consciousness: awake and alert and oriented Pain management: pain level controlled Vital Signs Assessment: post-procedure vital signs reviewed and stable Respiratory status: spontaneous breathing, nonlabored ventilation and respiratory function stable Cardiovascular status: blood pressure returned to baseline and stable Postop Assessment: no apparent nausea or vomiting Anesthetic complications: no   No notable events documented.  Last Vitals:  Vitals:   06/13/22 1700 06/13/22 1731  BP: (!) 125/47 (!) 119/52  Pulse: 72 77  Resp: 14 18  Temp:  36.9 C  SpO2: 98% 100%    Last Pain:  Vitals:   06/13/22 1630  TempSrc:   PainSc: 10-Worst pain ever                 [name] A.

## 2022-06-13 NOTE — Anesthesia Procedure Notes (Signed)
Procedure Name: LMA Insertion Date/Time: 06/13/2022 3:43 PM  Performed by: Timoteo Expose, CRNAPre-anesthesia Checklist: Patient identified, Emergency Drugs available, Suction available, Patient being monitored and Timeout performed Oxygen Delivery Method: Circle system utilized Preoxygenation: Pre-oxygenation with 100% oxygen Induction Type: IV induction LMA: LMA inserted LMA Size: 4.0

## 2022-06-13 NOTE — Progress Notes (Signed)
PT Cancellation Note  Patient Details Name: Susan [name] MRN: [id number] DOB: 1950-09-02   Cancelled Treatment:    Reason Eval/Treat Not Completed: Patient declined, no reason specified  Declines PT today. States she wants to rest before her operation. States she has been rotating regularly in bed for pressure relief.   Ellouise Newer 06/13/2022, 11:48 AM

## 2022-06-14 ENCOUNTER — Inpatient Hospital Stay (HOSPITAL_COMMUNITY): Payer: PPO

## 2022-06-14 DIAGNOSIS — K5792 Diverticulitis of intestine, part unspecified, without perforation or abscess without bleeding: Secondary | ICD-10-CM | POA: Diagnosis not present

## 2022-06-14 LAB — RENAL FUNCTION PANEL
Albumin: 2.2 g/dL — ABNORMAL LOW (ref 3.5–5.0)
Anion gap: 11 (ref 5–15)
BUN: 37 mg/dL — ABNORMAL HIGH (ref 8–23)
CO2: 23 mmol/L (ref 22–32)
Calcium: 8.9 mg/dL (ref 8.9–10.3)
Chloride: 97 mmol/L — ABNORMAL LOW (ref 98–111)
Creatinine, Ser: 2.56 mg/dL — ABNORMAL HIGH (ref 0.44–1.00)
GFR, Estimated: 19 mL/min — ABNORMAL LOW (ref >60)
Glucose, Bld: 409 mg/dL — ABNORMAL HIGH (ref 70–99)
Phosphorus: 4.9 mg/dL — ABNORMAL HIGH (ref 2.5–4.6)
Potassium: 5 mmol/L (ref 3.5–5.1)
Sodium: 131 mmol/L — ABNORMAL LOW (ref 135–145)

## 2022-06-14 LAB — CBC
HCT: 26.5 % — ABNORMAL LOW (ref 36.0–46.0)
Hemoglobin: 8.1 g/dL — ABNORMAL LOW (ref 12.0–15.0)
MCH: 26.6 pg (ref 26.0–34.0)
MCHC: 30.6 g/dL (ref 30.0–36.0)
MCV: 86.9 fL (ref 80.0–100.0)
Platelets: 221 10*3/uL (ref 150–400)
RBC: 3.05 MIL/uL — ABNORMAL LOW (ref 3.87–5.11)
RDW: 18.6 % — ABNORMAL HIGH (ref 11.5–15.5)
WBC: 8.6 10*3/uL (ref 4.0–10.5)
nRBC: 0 % (ref 0.0–0.2)

## 2022-06-14 LAB — GLUCOSE, CAPILLARY
Glucose-Capillary: 385 mg/dL — ABNORMAL HIGH (ref 70–99)
Glucose-Capillary: 298 mg/dL — ABNORMAL HIGH (ref 70–99)
Glucose-Capillary: 175 mg/dL — ABNORMAL HIGH (ref 70–99)
Glucose-Capillary: 213 mg/dL — ABNORMAL HIGH (ref 70–99)

## 2022-06-14 LAB — TROPONIN I (HIGH SENSITIVITY): Troponin I (High Sensitivity): 17 ng/L (ref <18)

## 2022-06-14 MED ORDER — HEPARIN SODIUM (PORCINE) 1000 UNIT/ML IJ SOLN
INTRAMUSCULAR | Status: AC
  Filled 2022-06-14: qty 4

## 2022-06-14 MED ORDER — NEPRO/CARBSTEADY PO LIQD
237.0000 mL | ORAL | Status: DC
  Administered 2022-06-16: 237 mL via ORAL

## 2022-06-14 MED ORDER — HEPARIN SODIUM (PORCINE) 1000 UNIT/ML IJ SOLN
3800.0000 [IU] | Freq: Once | INTRAMUSCULAR | Status: AC
  Administered 2022-06-14: 3800 [IU] via INTRAVENOUS

## 2022-06-14 NOTE — Progress Notes (Signed)
Received patient in bed to unit.  Alert and oriented.  Informed consent signed and in chart.   Treatment initiated: 13:50 Treatment completed: 16:50  Patient tolerated well.  Transported back to the room  Alert, without acute distress.  Hand-off given to patient's nurse.   Access used: right [facility] Access issues: none  Total UF removed: 1.5L Medication(s) given: midodrine '10mg'$         06/14/22 1650  Vitals  BP (!) 115/50  MAP (mmHg) 69  BP Location Right Arm  BP Method Automatic  Patient Position (if appropriate) Lying  Pulse Rate 91  Pulse Rate Source Monitor  ECG Heart Rate (!) 50  Resp 17  Oxygen Therapy  SpO2 100 %  O2 Device Nasal Cannula  O2 Flow Rate (L/min) 4 L/min  During Treatment Monitoring  Intra-Hemodialysis Comments Tx completed (pt wanted to end tx)  Dialysis Fluid Bolus Normal Saline   [name]

## 2022-06-14 NOTE — Progress Notes (Signed)
Inpatient Rehab Admissions Coordinator:    CIR consult received. Await PT/OT notes in order to make a full assessment of candidacy.   Clemens Catholic, [place], [place] Admissions Coordinator  [phone] ([place]) [phone] (office)

## 2022-06-14 NOTE — Progress Notes (Signed)
Patient ID: Susan [name], female   DOB: [birth year], 72 y.o.   MRN: [id number] Patient is postoperative day 1 revision below-knee amputation.  There is no drainage in the wound VAC canister there is a good suction fit.  Patient may discharge from an orthopedic standpoint when ready medically.  She will discharge with the Praveena plus portable wound VAC pump when she discharged back to skilled nursing.

## 2022-06-14 NOTE — Plan of Care (Signed)
  Problem: Education: Goal: Knowledge of General Education information will improve Description: Including pain rating scale, medication(s)/side effects and non-pharmacologic comfort measures Outcome: Progressing   Problem: Clinical Measurements: Goal: Ability to maintain clinical measurements within normal limits will improve Outcome: Progressing Goal: Will remain free from infection Outcome: Progressing Goal: Diagnostic test results will improve Outcome: Progressing Goal: Respiratory complications will improve Outcome: Progressing Goal: Cardiovascular complication will be avoided Outcome: Progressing   Problem: Coping: Goal: Level of anxiety will decrease Outcome: Progressing   Problem: Elimination: Goal: Will not experience complications related to bowel motility Outcome: Progressing   Problem: Pain Managment: Goal: General experience of comfort will improve Outcome: Progressing   Problem: Safety: Goal: Ability to remain free from injury will improve Outcome: Progressing   Problem: Skin Integrity: Goal: Risk for impaired skin integrity will decrease Outcome: Progressing   Problem: Education: Goal: Ability to describe self-care measures that may prevent or decrease complications (Diabetes Survival Skills Education) will improve Outcome: Progressing Goal: Individualized Educational Video(s) Outcome: Progressing   Problem: Coping: Goal: Ability to adjust to condition or change in health will improve Outcome: Progressing   Problem: Fluid Volume: Goal: Ability to maintain a balanced intake and output will improve Outcome: Progressing   Problem: Health Behavior/Discharge Planning: Goal: Ability to identify and utilize available resources and services will improve Outcome: Progressing Goal: Ability to manage health-related needs will improve Outcome: Progressing   Problem: Metabolic: Goal: Ability to maintain appropriate glucose levels will improve Outcome:  Progressing   Problem: Nutritional: Goal: Maintenance of adequate nutrition will improve Outcome: Progressing Goal: Progress toward achieving an optimal weight will improve Outcome: Progressing   Problem: Skin Integrity: Goal: Risk for impaired skin integrity will decrease Outcome: Progressing   Problem: Tissue Perfusion: Goal: Adequacy of tissue perfusion will improve Outcome: Progressing   Problem: Education: Goal: Knowledge of the prescribed therapeutic regimen will improve Outcome: Progressing Goal: Ability to verbalize activity precautions or restrictions will improve Outcome: Progressing Goal: Understanding of discharge needs will improve Outcome: Progressing   Problem: Activity: Goal: Ability to perform//tolerate increased activity and mobilize with assistive devices will improve Outcome: Progressing   Problem: Clinical Measurements: Goal: Postoperative complications will be avoided or minimized Outcome: Progressing   Problem: Self-Care: Goal: Ability to meet self-care needs will improve Outcome: Progressing   Problem: Self-Concept: Goal: Ability to maintain and perform role responsibilities to the fullest extent possible will improve Outcome: Progressing   Problem: Pain Management: Goal: Pain level will decrease with appropriate interventions Outcome: Progressing   Problem: Skin Integrity: Goal: Demonstration of wound healing without infection will improve Outcome: Progressing

## 2022-06-14 NOTE — Progress Notes (Signed)
PROGRESS NOTE    Susan [name]  ALP:[id number] DOB: Apr 01, 1951 DOA: 06/05/2022 PCP: [name], MD    Brief Narrative:   Susan [name] is a 72 y.o. female with past medical history significant for HTN, HLD, proximal atrial fibrillation on anticoagulation, chronic diastolic CHF, CAD, ESRD on HD MWF, chronic respiratory failure on O2, DM2, hypothyroidism, history of GI bleed, history of recurrent C. difficile colitis who presented to [facility] ED on 1/11 with complaints of lower abdominal pain associate with nausea/vomiting.  Evaluation in the ED notable for severe constipation with stercoral colitis.  Patient also positive for C. difficile but likely colonization per recommendations of ID.  Patient was started on aggressive bowel regimen.  Patient received 3 days of Augmentin for sterile coral colitis and currently on 10-day course of oral vancomycin for prophylaxis for C. difficile.  Patient was also seen by orthopedics, Dr. 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  Recent admission from 12/28 until 1 8//2024 for acute lower GI bleed. GI tried to perform flexible sigmoidoscopy x 2 which were unsuccessful. General surgery was consulted and patient underwent exam under anesthesia with oversewing of rectal ulcer found on 05/24/2022 by Dr. Rich Number. She had a total of 4 units of packed red blood cell transfusion during that hospitalization and Eliquis was recommended to be discontinued.   Assessment & Plan:   Severe constipation Stecoral colitis, sigmoid diverticulitis C. difficile colitis, ruled out Patient to ED with abdominal pain.  CT Abdo/pelvis notable for large stool ball within the rectum with scattered rectal wall pneumatosis concerning for stercoral colitis, sigmoid diverticulosis with acute diverticulitis.  C. difficile was positive, seen by ID who does not think this has active C. difficile colitis.  General surgery was consulted and recommended antibiotics for CT findings.   Patient completed 3-day course of Augmentin. -- General surgery following, appreciate assistance -- Continue oral vancomycin for 10 days for prophylaxis for C. Difficile -- Colace 100 g p.o. twice daily -- Senna daily -- MiraLAX daily -- Continue to monitor bowel movements closely, 1 reported over the last 24 hours  Right BKA with wound dehiscence Orthopedics was consulted and patient underwent right BKA revision by Dr. Sharol Given on 06/14/2022 with placement of a wound VAC.   --NWB RLE --Pain control with oxycodone, Dilaudid as needed --Outpatient follow-up with orthopedics 1 week  Perisplenic collection CT abdomen/pelvis with contrast noted 6 x 4 x 2 cm complex subcapsular collection superior aspect of the spleen with concern for hematoma versus infectious process.  Seen by general surgery, believe possible spontaneous subcapsular hematoma.  Has been off of anticoagulation for greater than 2 weeks.  Low suspicion for infectious collection given no gas.  Hemoglobin stable.  -- General surgery following, appreciate assistance -- Continue to hold aspirin, DVT prophylaxis  Hx infected left AV fistula Patient underwent I&D by vascular surgery, Dr. Carlis Abbott on 05/05/2022. Vascular surgery consulted and sutures were removed to left forearm on 1/17.  Chronic respiratory failure, O2 dependent CT chest without contrast 1/16 with small left pleural effusion, compressive atelectasis left lower lobe, old granulomatous disease. -- Continue supplemental oxygen, maintain SpO2 greater than 92%, on 3 L nasal cannula which is her baseline -- Albuterol nebs as needed for shortness of breath/wheezing  Orthostatic hypotension/autonomic dysfunction Hx essential hypertension -- Midodrine 5 mg p.o. 3 times daily -- Midodrine 10 mg p.o. daily on Monday/Wednesday/Fridays with HD  DM2 Hemoglobin A1c 8.9 on 05/02/2022, not well-controlled.  Home regimen includes Lantus 10  units nightly, NovoLog sliding scale,  Tradjenta. -- Semglee 30 units [place] qHS -- NovoLog 8 units 3 times daily AC -- Moderate SSI for coverage -- CBGs qAC/HS  Hypothyroidism -- Levothyroxine 50 mcg p.o. daily  Paroxysmal atrial fibrillation -- Amiodarone 200 mg p.o. daily -- No on anticoagulation due to rectal ulcer as above  Anxiety/depression -- Trintellix 20 mg p.o. every morning -- Ativan 0.5 mg p.o. every 8 hours as needed anxiety  DVT prophylaxis: SCD's Start: 06/13/22 1747 Place and maintain sequential compression device Start: 06/06/22 3267    Code Status: Full Code Family Communication: No family present at bedside this morning  Disposition Plan:  Level of care: Telemetry Medical Status is: Inpatient Remains inpatient appropriate because: Anticipate discharge back to SNF on Monday    Consultants:  General surgery Infection disease Orthopedics, Dr. Sharol Given Nephrology Vascular surgery  Procedures:  none  Antimicrobials:  Augmentin 1/13 - 1/15 Zosyn 1/11 - 1/12 Metronidazole 1/11 - 1/11 Oral vancomycin 1/11>> Perioperative cefazolin 1/19   Subjective: Patient seen examined bedside, resting comfortably.  Lying in bed.  Complaining of pain to right stump area.  Underwent revision of her BKA yesterday by Dr. Sharol Given with wound VAC in place.  Plan for repeat HD off schedule today. No other specific questions or concerns at this time. Denies headache, no dizziness, no chest pain, no palpitations, no abdominal pain, no fever/chills/night sweats, no nausea/vomiting/diarrhea, no focal weakness, no fatigue, no paresthesias.  No acute events overnight per nursing staff.  Objective: Vitals:   06/13/22 1731 06/13/22 2054 06/14/22 0241 06/14/22 0604  BP: (!) 119/52 (!) 140/47 (!) 132/113 (!) 144/57  Pulse: 77 87 85 86  Resp: '18 18 18 18  '$ Temp: 98.4 F (36.9 C) 97.9 F (36.6 C) 98.1 F (36.7 C) 97.7 F (36.5 C)  TempSrc:  Oral Oral Oral  SpO2: 100% 100% 99% 100%  Weight:  99.6 kg    Height:  '5\' 5"'$  (1.651  m)      Intake/Output Summary (Last 24 hours) at 06/14/2022 0849 Last data filed at 06/14/2022 0400 Gross per 24 hour  Intake 1023.25 ml  Output 0 ml  Net 1023.25 ml   Filed Weights   06/12/22 1501 06/13/22 0700 06/13/22 2054  Weight: 91.1 kg 93.3 kg 99.6 kg    Examination:  Physical Exam: GEN: NAD, alert and oriented x 3, chronically ill appearance HEENT: NCAT, PERRL, EOMI, sclera clear, MMM PULM: CTAB w/o wheezes/crackles, normal respiratory effort, on 3 L nasal cannula  CV: RRR w/o M/G/R GI: abd soft, NTND, NABS, no R/G/M MSK: no peripheral edema, right BKA site noted with dressing/wound VAC in place, clean/dry/intact NEURO: CN II-XII intact, no focal deficits, sensation to light touch intact PSYCH: normal mood/affect Integumentary: Right BKA site noted with wound VAC in place no other concerning rashes/lesions/wounds noted on exposed skin surfaces.      Data Reviewed: I have personally reviewed following labs and imaging studies  CBC: Recent Labs  Lab 06/09/22 0332 06/10/22 0330 06/12/22 1152 06/13/22 0708 06/14/22 0528  WBC 7.4 7.0 6.2 5.7 8.6  HGB 8.0* 7.7* 7.5* 8.2* 8.1*  HCT 27.2* 25.9* 24.9* 27.7* 26.5*  MCV 89.2 87.5 88.3 88.8 86.9  PLT 203 199 191 196 124   Basic Metabolic Panel: Recent Labs  Lab 06/08/22 0742 06/09/22 0332 06/10/22 0330 06/12/22 1152 06/13/22 0708 06/14/22 0528  NA 129* 128* 130* 129* 134* 131*  K 4.1 4.2 3.8 4.5 4.1 5.0  CL 96* 94* 95* 95* 98 97*  CO2 '25 23 29 26 29 23  '$ GLUCOSE 366* 328* 343* 329* 106* 409*  BUN 30* 37* 27* 31* 27* 37*  CREATININE 3.33* 3.88* 2.30* 2.73* 2.15* 2.56*  CALCIUM 8.0* 8.4* 8.5* 8.5* 8.8* 8.9  PHOS 3.4  --  2.6 3.1 3.5 4.9*   GFR: Estimated Creatinine Clearance: 22.9 mL/min (A) (by C-G formula based on SCr of 2.56 mg/dL (H)). Liver Function Tests: Recent Labs  Lab 06/08/22 0742 06/12/22 1152 06/13/22 0708 06/14/22 0528  ALBUMIN 1.8* 2.0* 2.1* 2.2*   No results for input(s): "LIPASE",  "AMYLASE" in the last 168 hours.  No results for input(s): "AMMONIA" in the last 168 hours. Coagulation Profile: No results for input(s): "INR", "PROTIME" in the last 168 hours. Cardiac Enzymes: No results for input(s): "CKTOTAL", "CKMB", "CKMBINDEX", "TROPONINI" in the last 168 hours. BNP (last 3 results) No results for input(s): "PROBNP" in the last 8760 hours. HbA1C: No results for input(s): "HGBA1C" in the last 72 hours. CBG: Recent Labs  Lab 06/13/22 1030 06/13/22 1624 06/13/22 1719 06/13/22 2055 06/14/22 0817  GLUCAP 114* 170* 164* 331* 385*   Lipid Profile: No results for input(s): "CHOL", "HDL", "LDLCALC", "TRIG", "CHOLHDL", "LDLDIRECT" in the last 72 hours. Thyroid Function Tests: No results for input(s): "TSH", "T4TOTAL", "FREET4", "T3FREE", "THYROIDAB" in the last 72 hours. Anemia Panel: No results for input(s): "VITAMINB12", "FOLATE", "FERRITIN", "TIBC", "IRON", "RETICCTPCT" in the last 72 hours. Sepsis Labs: No results for input(s): "PROCALCITON", "LATICACIDVEN" in the last 168 hours.  Recent Results (from the past 240 hour(s))  C Difficile Quick Screen w PCR reflex     Status: Abnormal   Collection Time: 06/05/22  9:03 AM   Specimen: STOOL  Result Value Ref Range Status   C Diff antigen POSITIVE (A) NEGATIVE Final   C Diff toxin NEGATIVE NEGATIVE Final   C Diff interpretation Results are indeterminate. See PCR results.  Final    Comment: Performed at [facility], [place] [address] [place], [place] [address]  C. Diff by PCR, Reflexed     Status: Abnormal   Collection Time: 06/05/22  9:03 AM  Result Value Ref Range Status   Toxigenic C. Difficile by PCR POSITIVE (A) NEGATIVE Final    Comment: Positive for toxigenic C. difficile with little to no toxin production. Only treat if clinical presentation suggests symptomatic illness. Performed at [facility], [place] [address] [place], [place] [address]   Blood culture (routine x 2)     Status: None    Collection Time: 06/05/22  9:21 AM   Specimen: BLOOD RIGHT ARM  Result Value Ref Range Status   Specimen Description BLOOD RIGHT ARM  Final   Special Requests   Final    BOTTLES DRAWN AEROBIC AND ANAEROBIC Blood Culture results may not be optimal due to an inadequate volume of blood received in culture bottles   Culture   Final    NO GROWTH 5 DAYS Performed at [facility], [place] [address], [place], [place] [address]    Report Status 06/10/2022 FINAL  Final  Resp panel by RT-PCR (RSV, Flu A&B, Covid) Anterior Nasal Swab     Status: None   Collection Time: 06/11/22 11:19 PM   Specimen: Anterior Nasal Swab  Result Value Ref Range Status   SARS Coronavirus 2 by RT PCR NEGATIVE NEGATIVE Final    Comment: (NOTE) SARS-CoV-2 target nucleic acids are NOT DETECTED.  The SARS-CoV-2 RNA is generally detectable in upper respiratory specimens during the acute  phase of infection. The lowest concentration of SARS-CoV-2 viral copies this assay can detect is 138 copies/mL. A negative result does not preclude SARS-Cov-2 infection and should not be used as the sole basis for treatment or other patient management decisions. A negative result may occur with  improper specimen collection/handling, submission of specimen other than nasopharyngeal swab, presence of viral mutation(s) within the areas targeted by this assay, and inadequate number of viral copies(<138 copies/mL). A negative result must be combined with clinical observations, patient history, and epidemiological information. The expected result is Negative.  Fact Sheet for Patients:  EntrepreneurPulse.com.au  Fact Sheet for Healthcare Providers:  IncredibleEmployment.be  This test is no t yet approved or cleared by the [place] FDA and  has been authorized for detection and/or diagnosis of SARS-CoV-2 by FDA under an Emergency Use Authorization (EUA). This EUA will remain  in effect (meaning  this test can be used) for the duration of the COVID-19 declaration under Section 564(b)(1) of the Act, 21 U.S.C.section 360bbb-3(b)(1), unless the authorization is terminated  or revoked sooner.       Influenza A by PCR NEGATIVE NEGATIVE Final   Influenza B by PCR NEGATIVE NEGATIVE Final    Comment: (NOTE) The Xpert Xpress SARS-CoV-2/FLU/RSV plus assay is intended as an aid in the diagnosis of influenza from Nasopharyngeal swab specimens and should not be used as a sole basis for treatment. Nasal washings and aspirates are unacceptable for Xpert Xpress SARS-CoV-2/FLU/RSV testing.  Fact Sheet for Patients: EntrepreneurPulse.com.au  Fact Sheet for Healthcare Providers: IncredibleEmployment.be  This test is not yet approved or cleared by the [place] FDA and has been authorized for detection and/or diagnosis of SARS-CoV-2 by FDA under an Emergency Use Authorization (EUA). This EUA will remain in effect (meaning this test can be used) for the duration of the COVID-19 declaration under Section 564(b)(1) of the Act, 21 U.S.C. section 360bbb-3(b)(1), unless the authorization is terminated or revoked.     Resp Syncytial Virus by PCR NEGATIVE NEGATIVE Final    Comment: (NOTE) Fact Sheet for Patients: EntrepreneurPulse.com.au  Fact Sheet for Healthcare Providers: IncredibleEmployment.be  This test is not yet approved or cleared by the [place] FDA and has been authorized for detection and/or diagnosis of SARS-CoV-2 by FDA under an Emergency Use Authorization (EUA). This EUA will remain in effect (meaning this test can be used) for the duration of the COVID-19 declaration under Section 564(b)(1) of the Act, 21 U.S.C. section 360bbb-3(b)(1), unless the authorization is terminated or revoked.  Performed at [facility], [place] [address], [place], [place] [address]          Radiology  Studies: DG CHEST PORT 1 VIEW  Result Date: 06/14/2022 CLINICAL DATA:  Chest pain EXAM: PORTABLE CHEST 1 VIEW COMPARISON:  06/11/2022 FINDINGS: Mild left basilar atelectasis. Lungs are otherwise clear. No pneumothorax or pleural effusion. Right internal jugular hemodialysis catheter tip noted at the superior cavoatrial junction. Cardiac size within normal limits. Pulmonary vascularity is normal. No acute bone abnormality. IMPRESSION: 1. Mild left basilar atelectasis. Electronically Signed   By: Fidela Salisbury M.D.   On: 06/14/2022 03:29   VAS US DUPLEX DIALYSIS ACCESS (AVF, AVG)  Result Date: 06/13/2022 DIALYSIS ACCESS Patient Name:  JIANA LEMAIRE  Date of Exam:   06/13/2022 Medical Rec #: [id number]     Accession #:    [id number] Date of Birth: 07-26-1950     Patient Gender: F Patient Age:   16 years Exam Location:  [name] Procedure:      VAS US DUPLEX DIALYSIS ACCESS (AVF, AVG) Referring Phys: EMMA COLLINS --------------------------------------------------------------------------------  Reason for Exam: Routine follow up. Access Site: Left Upper Extremity. Access Type: First stage basilic vein transposition. History: 04-02-2022 Left AVF creation          05-05-2022 Debridement of left AVF. Comparison Study: No prior studies. Performing Technologist: Darlin Coco RDMS, RVT  Examination Guidelines: A complete evaluation includes B-mode imaging, spectral Doppler, color Doppler, and power Doppler as needed of all accessible portions of each vessel. Unilateral testing is considered an integral part of a complete examination. Limited examinations for reoccurring indications may be performed as noted.  Findings: +--------------------+----------+-----------------+--------+ AVF                 PSV (cm/s)Flow Vol (mL/min)Comments +--------------------+----------+-----------------+--------+ Native artery inflow   171           415                 +--------------------+----------+-----------------+--------+ AVF Anastomosis        252                              +--------------------+----------+-----------------+--------+  +--------------------+----------+-------------+----------+---------------------+ OUTFLOW VEIN        PSV (cm/s)Diameter (cm)Depth (cm)      Describe        +--------------------+----------+-------------+----------+---------------------+ Confluence at Axilla    81        0.61        1.13                         +--------------------+----------+-------------+----------+---------------------+ Prox UA                 53        0.68        1.60                         +--------------------+----------+-------------+----------+---------------------+ Mid UA                 126        0.74        1.66    Retained valve and                                                          competing branch    +--------------------+----------+-------------+----------+---------------------+ Dist UA                649        0.40        1.12                         +--------------------+----------+-------------+----------+---------------------+   Summary: Patent left first stage basilic transposition with retained valves and competing branch at the mid upper arm.  *See table(s) above for measurements and observations.      --------------------------------------------------------------------------------   Preliminary         Scheduled Meds:  (feeding supplement) PROSource Plus  30 mL Oral BID BM   amiodarone  200 mg Oral Daily   vitamin C  1,000 mg Oral Daily   Chlorhexidine Gluconate Cloth  6 each Topical Q0600  docusate sodium  100 mg Oral Daily   Gerhardt's butt cream   Topical TID   guaiFENesin  600 mg Oral BID   insulin aspart  0-15 Units Subcutaneous TID WC   insulin aspart  0-5 Units Subcutaneous QHS   insulin aspart  8 Units Subcutaneous TID WC   insulin glargine-yfgn  30 Units Subcutaneous QHS    levothyroxine  50 mcg Oral QAC breakfast   midodrine  10 mg Oral Q M,W,F-HD   midodrine  5 mg Oral TID WC   multivitamin  1 tablet Oral Daily   nutrition supplement (JUVEN)  1 packet Oral BID BM   pantoprazole  40 mg Oral Daily   polyethylene glycol  17 g Oral Daily   pregabalin  75 mg Oral Daily   saccharomyces boulardii  500 mg Oral BID   senna  1 tablet Oral Daily   sodium chloride flush  3 mL Intravenous Q12H   vancomycin  125 mg Oral BID   vortioxetine HBr  20 mg Oral q AM   zinc sulfate  220 mg Oral Daily   Continuous Infusions:  sodium chloride 75 mL/hr at 06/14/22 0521   magnesium sulfate bolus IVPB       LOS: 9 days    Time spent: 50 minutes spent on chart review, discussion with nursing staff, consultants, updating family and interview/physical exam; more than 50% of that time was spent in counseling and/or coordination of care.    Janson Lamar J [place] DO [facility] Available via Epic secure chat 7am-7pm After these hours, please refer to coverage provider listed on [email or web address] 06/14/2022, 8:49 AM

## 2022-06-14 NOTE — Progress Notes (Signed)
[facility] Progress Note   Subjective:   Patient seen and examined at bedside.  Reports pain in stump post surgery.  States she had CP overnight but it has now resolved.  Breathing improved but continues to have intermittent SOB and increased O2 requirement.  Admits to nausea.  Denies vomiting/diarrhea.   Objective Vitals:   06/13/22 1731 06/13/22 2054 06/14/22 0241 06/14/22 0604  BP: (!) 119/52 (!) 140/47 (!) 132/113 (!) 144/57  Pulse: 77 87 85 86  Resp: '18 18 18 18  '$ Temp: 98.4 F (36.9 C) 97.9 F (36.6 C) 98.1 F (36.7 C) 97.7 F (36.5 C)  TempSrc:  Oral Oral Oral  SpO2: 100% 100% 99% 100%  Weight:  99.6 kg    Height:  '5\' 5"'$  (1.651 m)     Physical Exam General:chronically ill appearing female in NAD Heart:RRR, no mrg Lungs:mostly CTAB, +rhonchi RLL, nml WOB on 4L O2 Abdomen:soft, NTND Extremities:1+ edema b/l hips, R BKA Dialysis Access: R IJ TDC, LU AVF +b/t   Filed Weights   06/12/22 1501 06/13/22 0700 06/13/22 2054  Weight: 91.1 kg 93.3 kg 99.6 kg    Intake/Output Summary (Last 24 hours) at 06/14/2022 0830 Last data filed at 06/14/2022 0400 Gross per 24 hour  Intake 1023.25 ml  Output 0 ml  Net 1023.25 ml    Additional Objective Labs: Basic Metabolic Panel: Recent Labs  Lab 06/12/22 1152 06/13/22 0708 06/14/22 0528  NA 129* 134* 131*  K 4.5 4.1 5.0  CL 95* 98 97*  CO2 '26 29 23  '$ GLUCOSE 329* 106* 409*  BUN 31* 27* 37*  CREATININE 2.73* 2.15* 2.56*  CALCIUM 8.5* 8.8* 8.9  PHOS 3.1 3.5 4.9*   Liver Function Tests: Recent Labs  Lab 06/12/22 1152 06/13/22 0708 06/14/22 0528  ALBUMIN 2.0* 2.1* 2.2*   CBC: Recent Labs  Lab 06/09/22 0332 06/10/22 0330 06/12/22 1152 06/13/22 0708 06/14/22 0528  WBC 7.4 7.0 6.2 5.7 8.6  HGB 8.0* 7.7* 7.5* 8.2* 8.1*  HCT 27.2* 25.9* 24.9* 27.7* 26.5*  MCV 89.2 87.5 88.3 88.8 86.9  PLT 203 199 191 196 221   CBG: Recent Labs  Lab 06/13/22 1030 06/13/22 1624 06/13/22 1719 06/13/22 2055  06/14/22 0817  GLUCAP 114* 170* 164* 331* 385*    Studies/Results: DG CHEST PORT 1 VIEW  Result Date: 06/14/2022 CLINICAL DATA:  Chest pain EXAM: PORTABLE CHEST 1 VIEW COMPARISON:  06/11/2022 FINDINGS: Mild left basilar atelectasis. Lungs are otherwise clear. No pneumothorax or pleural effusion. Right internal jugular hemodialysis catheter tip noted at the superior cavoatrial junction. Cardiac size within normal limits. Pulmonary vascularity is normal. No acute bone abnormality. IMPRESSION: 1. Mild left basilar atelectasis. Electronically Signed   By: Fidela Salisbury M.D.   On: 06/14/2022 03:29   VAS US DUPLEX DIALYSIS ACCESS (AVF, AVG)  Result Date: 06/13/2022 DIALYSIS ACCESS Patient Name:  Susan [name]  Date of Exam:   06/13/2022 Medical Rec #: [id number]     Accession #:    [id number] Date of Birth: 1950/08/10     Patient Gender: F Patient Age:   72 years Exam Location:  [facility] Procedure:      VAS US DUPLEX DIALYSIS ACCESS (AVF, AVG) Referring Phys: EMMA COLLINS --------------------------------------------------------------------------------  Reason for Exam: Routine follow up. Access Site: Left Upper Extremity. Access Type: First stage basilic vein transposition. History: 04-02-2022 Left AVF creation          05-05-2022 Debridement of left AVF. Comparison Study: No prior studies. Performing Technologist:  Darlin Coco RDMS, RVT  Examination Guidelines: A complete evaluation includes B-mode imaging, spectral Doppler, color Doppler, and power Doppler as needed of all accessible portions of each vessel. Unilateral testing is considered an integral part of a complete examination. Limited examinations for reoccurring indications may be performed as noted.  Findings: +--------------------+----------+-----------------+--------+ AVF                 PSV (cm/s)Flow Vol (mL/min)Comments +--------------------+----------+-----------------+--------+ Native artery inflow   171           415                 +--------------------+----------+-----------------+--------+ AVF Anastomosis        252                              +--------------------+----------+-----------------+--------+  +--------------------+----------+-------------+----------+---------------------+ OUTFLOW VEIN        PSV (cm/s)Diameter (cm)Depth (cm)      Describe        +--------------------+----------+-------------+----------+---------------------+ Confluence at Axilla    81        0.61        1.13                         +--------------------+----------+-------------+----------+---------------------+ Prox UA                 53        0.68        1.60                         +--------------------+----------+-------------+----------+---------------------+ Mid UA                 126        0.74        1.66    Retained valve and                                                          competing branch    +--------------------+----------+-------------+----------+---------------------+ Dist UA                649        0.40        1.12                         +--------------------+----------+-------------+----------+---------------------+   Summary: Patent left first stage basilic transposition with retained valves and competing branch at the mid upper arm.  *See table(s) above for measurements and observations.      --------------------------------------------------------------------------------   Preliminary     Medications:  sodium chloride 75 mL/hr at 06/14/22 0521   magnesium sulfate bolus IVPB      (feeding supplement) PROSource Plus  30 mL Oral BID BM   amiodarone  200 mg Oral Daily   vitamin C  1,000 mg Oral Daily   Chlorhexidine Gluconate Cloth  6 each Topical Q0600   docusate sodium  100 mg Oral Daily   Gerhardt's butt cream   Topical TID   guaiFENesin  600 mg Oral BID   insulin aspart  0-15 Units Subcutaneous TID WC   insulin aspart  0-5 Units Subcutaneous QHS   insulin aspart  8  Units  Subcutaneous TID WC   insulin glargine-yfgn  30 Units Subcutaneous QHS   levothyroxine  50 mcg Oral QAC breakfast   midodrine  10 mg Oral Q M,W,F-HD   midodrine  5 mg Oral TID WC   multivitamin  1 tablet Oral Daily   nutrition supplement (JUVEN)  1 packet Oral BID BM   pantoprazole  40 mg Oral Daily   polyethylene glycol  17 g Oral Daily   pregabalin  75 mg Oral Daily   saccharomyces boulardii  500 mg Oral BID   senna  1 tablet Oral Daily   sodium chloride flush  3 mL Intravenous Q12H   vancomycin  125 mg Oral BID   vortioxetine HBr  20 mg Oral q AM   zinc sulfate  220 mg Oral Daily    Dialysis Orders: SW MWF  4h  400/800   97kg   3K/2.5Ca bath  TDC  Hep none - last HD 1/10, post wt 98.8 - mircera 150 mcg q2, last 1/08, due 1/22   Assessment/Plan: Abd pain/ stercoral proctitis w/o evidence of perfortaion/ acute diverticulitis - per abd CT. Hx of recent Cdif infection. Po vanc started for suspected Cdif, gen surg and ID consulted. No emergent surgical needs. On Augmentin Perisplenic collection - etiology unclear. Per [facility] unlikely infectious, ?infarct vs delayed bleed vs trauma.  R BKA Dehiscence - Followed by Dr. Sharol Given. Revision yesterday.  DM2 - uncont, per pmd ESRD - on HD MWF. Had HD 1/17, Off schedule yesterday d/t plan for surgery/SOB.  Plan for short HD today, resume regular schedule on Monday. On midodrine HTN/ volume - sig LE / hip edema on exam. On increased O2. BP's are soft at admit but now stable. Continue max UF as tolerated. Under edw if weights correct, will need lower dry on d/c.  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 Transfuse prn.  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  Holding binders d/t low phos on admit. Not on vdra. Monitor trend.  Nutrition - Albumin 2.2, add prosource. Renal diet w/fluid restrictions.  PAF - no longer on a/c due to hx of GIB H/o PAD - sp R BKA Recent bleeding rectal ulcer - rx'd surgically dec 2023  Jen Mow,  PA-C [place] 06/14/2022,8:30 AM  LOS: 9 days

## 2022-06-15 DIAGNOSIS — K5792 Diverticulitis of intestine, part unspecified, without perforation or abscess without bleeding: Secondary | ICD-10-CM | POA: Diagnosis not present

## 2022-06-15 LAB — GLUCOSE, CAPILLARY
Glucose-Capillary: 212 mg/dL — ABNORMAL HIGH (ref 70–99)
Glucose-Capillary: 207 mg/dL — ABNORMAL HIGH (ref 70–99)
Glucose-Capillary: 124 mg/dL — ABNORMAL HIGH (ref 70–99)
Glucose-Capillary: 164 mg/dL — ABNORMAL HIGH (ref 70–99)

## 2022-06-15 MED ORDER — DARBEPOETIN ALFA 150 MCG/0.3ML IJ SOSY
150.0000 ug | PREFILLED_SYRINGE | INTRAMUSCULAR | Status: DC
  Administered 2022-06-16: 150 ug via SUBCUTANEOUS
  Filled 2022-06-15: qty 0.3

## 2022-06-15 MED ORDER — INSULIN GLARGINE-YFGN 100 UNIT/ML ~~LOC~~ SOLN
40.0000 [IU] | Freq: Every day | SUBCUTANEOUS | Status: DC
  Administered 2022-06-15 – 2022-06-16 (×2): 40 [IU] via SUBCUTANEOUS
  Filled 2022-06-15 (×3): qty 0.4

## 2022-06-15 MED ORDER — DIPHENHYDRAMINE HCL 25 MG PO CAPS
25.0000 mg | ORAL_CAPSULE | Freq: Once | ORAL | Status: AC
  Administered 2022-06-15: 25 mg via ORAL
  Filled 2022-06-15: qty 1

## 2022-06-15 MED ORDER — CHLORHEXIDINE GLUCONATE CLOTH 2 % EX PADS
6.0000 | MEDICATED_PAD | Freq: Every day | CUTANEOUS | Status: DC
  Administered 2022-06-17: 6 via TOPICAL

## 2022-06-15 NOTE — Progress Notes (Addendum)
[employer] auth request submitted to Amy for SNF placement at [facility] and [place] transport. Will provide updates as available.   Wandra Feinstein, MSW, LCSW [phone] (coverage)

## 2022-06-15 NOTE — Progress Notes (Signed)
PROGRESS NOTE    Susan [name]  KGM:[id number] DOB: [birth year] DOA: 06/05/2022 PCP: [name], MD    Brief Narrative:   Susan [name] is a 72 y.o. female with past medical history significant for HTN, HLD, proximal atrial fibrillation on anticoagulation, chronic diastolic CHF, CAD, ESRD on HD MWF, chronic respiratory failure on O2, DM2, hypothyroidism, history of GI bleed, history of recurrent C. difficile colitis who presented to [facility] ED on 1/11 with complaints of lower abdominal pain associate with nausea/vomiting.  Evaluation in the ED notable for severe constipation with stercoral colitis.  Patient also positive for C. difficile but likely colonization per recommendations of ID.  Patient was started on aggressive bowel regimen.  Patient received 3 days of Augmentin for sterile coral colitis and currently on 10-day course of oral vancomycin for prophylaxis for C. difficile.  Patient was also seen by orthopedics, Dr. 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  Recent admission from 12/28 until 1 8//2024 for acute lower GI bleed. GI tried to perform flexible sigmoidoscopy x 2 which were unsuccessful. General surgery was consulted and patient underwent exam under anesthesia with oversewing of rectal ulcer found on 05/24/2022 by Dr. Rich Number. She had a total of 4 units of packed red blood cell transfusion during that hospitalization and Eliquis was recommended to be discontinued.   Assessment & Plan:   Severe constipation Stecoral colitis, sigmoid diverticulitis C. difficile colitis, ruled out Patient to ED with abdominal pain.  CT Abdo/pelvis notable for large stool ball within the rectum with scattered rectal wall pneumatosis concerning for stercoral colitis, sigmoid diverticulosis with acute diverticulitis.  C. difficile was positive, seen by ID who does not think this has active C. difficile colitis.  General surgery was consulted and recommended antibiotics for CT findings.   Patient completed 3-day course of Augmentin. -- General surgery following, appreciate assistance -- Continue oral vancomycin for 10 days for prophylaxis for C. Difficile -- Colace 100 g p.o. twice daily -- Senna daily -- MiraLAX BID -- Continue to monitor bowel movements closely  Right BKA with wound dehiscence Orthopedics was consulted and patient underwent right BKA revision by Dr. Sharol Given on 06/14/2022 with placement of a wound VAC.   --NWB RLE --Pain control with oxycodone, Dilaudid as needed --Outpatient follow-up with orthopedics 1 week  Perisplenic collection CT abdomen/pelvis with contrast noted 6 x 4 x 2 cm complex subcapsular collection superior aspect of the spleen with concern for hematoma versus infectious process.  Seen by general surgery, believe possible spontaneous subcapsular hematoma.  Has been off of anticoagulation for greater than 2 weeks.  Low suspicion for infectious collection given no gas.  Hemoglobin stable.  -- General surgery following, appreciate assistance -- Continue to hold aspirin, DVT prophylaxis  Hx infected left AV fistula Patient underwent I&D by vascular surgery, Dr. Carlis Abbott on 05/05/2022. Vascular surgery consulted and sutures were removed to left forearm on 1/17.  Outpatient follow-up with vascular surgery.  Chronic respiratory failure, O2 dependent CT chest without contrast 1/16 with small left pleural effusion, compressive atelectasis left lower lobe, old granulomatous disease. -- Continue supplemental oxygen, maintain SpO2 greater than 92%, on 3 L nasal cannula which is her baseline -- Albuterol nebs as needed for shortness of breath/wheezing  Orthostatic hypotension/autonomic dysfunction Hx essential hypertension -- Midodrine 5 mg p.o. 3 times daily -- Midodrine 10 mg p.o. daily on Monday/Wednesday/Fridays with HD  DM2 Hemoglobin A1c 8.9 on 05/02/2022, not well-controlled.  Home regimen includes Lantus 10 units  nightly, NovoLog sliding scale,  Tradjenta. -- Semglee 40 units [place] qHS -- NovoLog 8 units 3 times daily AC -- Moderate SSI for coverage -- CBGs qAC/HS  Hypothyroidism -- Levothyroxine 50 mcg p.o. daily  Paroxysmal atrial fibrillation -- Amiodarone 200 mg p.o. daily -- No on anticoagulation due to rectal ulcer as above  Anxiety/depression -- Trintellix 20 mg p.o. every morning -- Ativan 0.5 mg p.o. every 8 hours as needed anxiety  DVT prophylaxis: SCD's Start: 06/13/22 1747 Place and maintain sequential compression device Start: 06/06/22 5366    Code Status: Full Code Family Communication: No family present at bedside this morning  Disposition Plan:  Level of care: Telemetry Medical Status is: Inpatient Remains inpatient appropriate because: Anticipate discharge back to SNF on Tuesday    Consultants:  General surgery Infection disease Orthopedics, Dr. Sharol Given Nephrology Vascular surgery  Procedures:  none  Antimicrobials:  Augmentin 1/13 - 1/15 Zosyn 1/11 - 1/12 Metronidazole 1/11 - 1/11 Oral vancomycin 1/11>> Perioperative cefazolin 1/19   Subjective: Patient seen examined bedside, resting comfortably.  Lying in bed.  Complaining of pain to right stump area.  No other specific questions or concerns at this time.  Remove sutures from right neck.  Denies headache, no dizziness, no chest pain, no palpitations, no abdominal pain, no fever/chills/night sweats, no nausea/vomiting/diarrhea, no focal weakness, no fatigue, no paresthesias.  No acute events overnight per nursing staff.  Objective: Vitals:   06/14/22 1721 06/14/22 2104 06/14/22 2146 06/15/22 0314  BP: (!) 118/46 (!) 104/43  123/67  Pulse: 92 79 80 88  Resp: 16 20    Temp: 97.6 F (36.4 C) 98.4 F (36.9 C) 98.3 F (36.8 C)   TempSrc: Oral Oral Oral   SpO2: 99% 100% 100% 93%  Weight:      Height:        Intake/Output Summary (Last 24 hours) at 06/15/2022 0906 Last data filed at 06/15/2022 4403 Gross per 24 hour  Intake 720 ml   Output 1.5 ml  Net 718.5 ml   Filed Weights   06/13/22 0700 06/13/22 2054  Weight: 93.3 kg 99.6 kg    Examination:  Physical Exam: GEN: NAD, alert and oriented x 3, chronically ill appearance HEENT: NCAT, PERRL, EOMI, sclera clear, MMM PULM: CTAB w/o wheezes/crackles, normal respiratory effort, on 3 L nasal cannula  CV: RRR w/o M/G/R GI: abd soft, NTND, NABS, no R/G/M MSK: no peripheral edema, right BKA site noted with dressing/wound VAC in place, clean/dry/intact NEURO: CN II-XII intact, no focal deficits, sensation to light touch intact PSYCH: normal mood/affect Integumentary: Right BKA site noted with wound VAC in place no other concerning rashes/lesions/wounds noted on exposed skin surfaces.      Data Reviewed: I have personally reviewed following labs and imaging studies  CBC: Recent Labs  Lab 06/09/22 0332 06/10/22 0330 06/12/22 1152 06/13/22 0708 06/14/22 0528  WBC 7.4 7.0 6.2 5.7 8.6  HGB 8.0* 7.7* 7.5* 8.2* 8.1*  HCT 27.2* 25.9* 24.9* 27.7* 26.5*  MCV 89.2 87.5 88.3 88.8 86.9  PLT 203 199 191 196 474   Basic Metabolic Panel: Recent Labs  Lab 06/09/22 0332 06/10/22 0330 06/12/22 1152 06/13/22 0708 06/14/22 0528  NA 128* 130* 129* 134* 131*  K 4.2 3.8 4.5 4.1 5.0  CL 94* 95* 95* 98 97*  CO2 '23 29 26 29 23  '$ GLUCOSE 328* 343* 329* 106* 409*  BUN 37* 27* 31* 27* 37*  CREATININE 3.88* 2.30* 2.73* 2.15* 2.56*  CALCIUM 8.4* 8.5* 8.5* 8.8*  8.9  PHOS  --  2.6 3.1 3.5 4.9*   GFR: Estimated Creatinine Clearance: 22.9 mL/min (A) (by C-G formula based on SCr of 2.56 mg/dL (H)). Liver Function Tests: Recent Labs  Lab 06/12/22 1152 06/13/22 0708 06/14/22 0528  ALBUMIN 2.0* 2.1* 2.2*   No results for input(s): "LIPASE", "AMYLASE" in the last 168 hours.  No results for input(s): "AMMONIA" in the last 168 hours. Coagulation Profile: No results for input(s): "INR", "PROTIME" in the last 168 hours. Cardiac Enzymes: No results for input(s): "CKTOTAL",  "CKMB", "CKMBINDEX", "TROPONINI" in the last 168 hours. BNP (last 3 results) No results for input(s): "PROBNP" in the last 8760 hours. HbA1C: No results for input(s): "HGBA1C" in the last 72 hours. CBG: Recent Labs  Lab 06/14/22 0817 06/14/22 1133 06/14/22 1719 06/14/22 2102 06/15/22 0715  GLUCAP 385* 298* 175* 213* 212*   Lipid Profile: No results for input(s): "CHOL", "HDL", "LDLCALC", "TRIG", "CHOLHDL", "LDLDIRECT" in the last 72 hours. Thyroid Function Tests: No results for input(s): "TSH", "T4TOTAL", "FREET4", "T3FREE", "THYROIDAB" in the last 72 hours. Anemia Panel: No results for input(s): "VITAMINB12", "FOLATE", "FERRITIN", "TIBC", "IRON", "RETICCTPCT" in the last 72 hours. Sepsis Labs: No results for input(s): "PROCALCITON", "LATICACIDVEN" in the last 168 hours.  Recent Results (from the past 240 hour(s))  Blood culture (routine x 2)     Status: None   Collection Time: 06/05/22  9:21 AM   Specimen: BLOOD RIGHT ARM  Result Value Ref Range Status   Specimen Description BLOOD RIGHT ARM  Final   Special Requests   Final    BOTTLES DRAWN AEROBIC AND ANAEROBIC Blood Culture results may not be optimal due to an inadequate volume of blood received in culture bottles   Culture   Final    NO GROWTH 5 DAYS Performed at [facility], [place] [address] [place], [place] [address]    Report Status 06/10/2022 FINAL  Final  Resp panel by RT-PCR (RSV, Flu A&B, Covid) Anterior Nasal Swab     Status: None   Collection Time: 06/11/22 11:19 PM   Specimen: Anterior Nasal Swab  Result Value Ref Range Status   SARS Coronavirus 2 by RT PCR NEGATIVE NEGATIVE Final    Comment: (NOTE) SARS-CoV-2 target nucleic acids are NOT DETECTED.  The SARS-CoV-2 RNA is generally detectable in upper respiratory specimens during the acute phase of infection. The lowest concentration of SARS-CoV-2 viral copies this assay can detect is 138 copies/mL. A negative result does not preclude  SARS-Cov-2 infection and should not be used as the sole basis for treatment or other patient management decisions. A negative result may occur with  improper specimen collection/handling, submission of specimen other than nasopharyngeal swab, presence of viral mutation(s) within the areas targeted by this assay, and inadequate number of viral copies(<138 copies/mL). A negative result must be combined with clinical observations, patient history, and epidemiological information. The expected result is Negative.  Fact Sheet for Patients:  EntrepreneurPulse.com.au  Fact Sheet for Healthcare Providers:  IncredibleEmployment.be  This test is no t yet approved or cleared by the [place] FDA and  has been authorized for detection and/or diagnosis of SARS-CoV-2 by FDA under an Emergency Use Authorization (EUA). This EUA will remain  in effect (meaning this test can be used) for the duration of the COVID-19 declaration under Section 564(b)(1) of the Act, 21 U.S.C.section 360bbb-3(b)(1), unless the authorization is terminated  or revoked sooner.       Influenza A by PCR NEGATIVE NEGATIVE Final  Influenza B by PCR NEGATIVE NEGATIVE Final    Comment: (NOTE) The Xpert Xpress SARS-CoV-2/FLU/RSV plus assay is intended as an aid in the diagnosis of influenza from Nasopharyngeal swab specimens and should not be used as a sole basis for treatment. Nasal washings and aspirates are unacceptable for Xpert Xpress SARS-CoV-2/FLU/RSV testing.  Fact Sheet for Patients: EntrepreneurPulse.com.au  Fact Sheet for Healthcare Providers: IncredibleEmployment.be  This test is not yet approved or cleared by the [place] FDA and has been authorized for detection and/or diagnosis of SARS-CoV-2 by FDA under an Emergency Use Authorization (EUA). This EUA will remain in effect (meaning this test can be used) for the duration of  the COVID-19 declaration under Section 564(b)(1) of the Act, 21 U.S.C. section 360bbb-3(b)(1), unless the authorization is terminated or revoked.     Resp Syncytial Virus by PCR NEGATIVE NEGATIVE Final    Comment: (NOTE) Fact Sheet for Patients: EntrepreneurPulse.com.au  Fact Sheet for Healthcare Providers: IncredibleEmployment.be  This test is not yet approved or cleared by the [place] FDA and has been authorized for detection and/or diagnosis of SARS-CoV-2 by FDA under an Emergency Use Authorization (EUA). This EUA will remain in effect (meaning this test can be used) for the duration of the COVID-19 declaration under Section 564(b)(1) of the Act, 21 U.S.C. section 360bbb-3(b)(1), unless the authorization is terminated or revoked.  Performed at [facility], [place] [address] [place], [place] [address]          Radiology Studies: VAS US DUPLEX DIALYSIS ACCESS (AVF, AVG)  Result Date: 06/14/2022 DIALYSIS ACCESS Patient Name:  MARYL BLALOCK  Date of Exam:   06/13/2022 Medical Rec #: [id number]     Accession #:    [id number] Date of Birth: 1951/03/16     Patient Gender: F Patient Age:   32 years Exam Location:  [facility] Procedure:      VAS US DUPLEX DIALYSIS ACCESS (AVF, AVG) Referring Phys: EMMA COLLINS --------------------------------------------------------------------------------  Reason for Exam: Routine follow up. Access Site: Left Upper Extremity. Access Type: First stage basilic vein transposition. History: 04-02-2022 Left AVF creation          05-05-2022 Debridement of left AVF. Comparison Study: No prior studies. Performing Technologist: Darlin Coco RDMS, RVT  Examination Guidelines: A complete evaluation includes B-mode imaging, spectral Doppler, color Doppler, and power Doppler as needed of all accessible portions of each vessel. Unilateral testing is considered an integral part of a complete examination. Limited  examinations for reoccurring indications may be performed as noted.  Findings: +--------------------+----------+-----------------+--------+ AVF                 PSV (cm/s)Flow Vol (mL/min)Comments +--------------------+----------+-----------------+--------+ Native artery inflow   171           415                +--------------------+----------+-----------------+--------+ AVF Anastomosis        252                              +--------------------+----------+-----------------+--------+  +--------------------+----------+-------------+----------+---------------------+ OUTFLOW VEIN        PSV (cm/s)Diameter (cm)Depth (cm)      Describe        +--------------------+----------+-------------+----------+---------------------+ Confluence at Axilla    81        0.61        1.13                         +--------------------+----------+-------------+----------+---------------------+  Prox UA                 53        0.68        1.60                         +--------------------+----------+-------------+----------+---------------------+ Mid UA                 126        0.74        1.66    Retained valve and                                                          competing branch    +--------------------+----------+-------------+----------+---------------------+ Dist UA                649        0.40        1.12                         +--------------------+----------+-------------+----------+---------------------+   Summary: Patent left first stage basilic transposition with retained valves and competing branch at the mid upper arm.  *See table(s) above for measurements and observations.  Diagnosing physician: Harold Barban MD Electronically signed by Harold Barban MD on 06/14/2022 at 10:18:21 AM.   --------------------------------------------------------------------------------   Final    DG CHEST PORT 1 VIEW  Result Date: 06/14/2022 CLINICAL DATA:  Chest pain EXAM:  PORTABLE CHEST 1 VIEW COMPARISON:  06/11/2022 FINDINGS: Mild left basilar atelectasis. Lungs are otherwise clear. No pneumothorax or pleural effusion. Right internal jugular hemodialysis catheter tip noted at the superior cavoatrial junction. Cardiac size within normal limits. Pulmonary vascularity is normal. No acute bone abnormality. IMPRESSION: 1. Mild left basilar atelectasis. Electronically Signed   By: Fidela Salisbury M.D.   On: 06/14/2022 03:29        Scheduled Meds:  (feeding supplement) PROSource Plus  30 mL Oral BID BM   amiodarone  200 mg Oral Daily   vitamin C  1,000 mg Oral Daily   Chlorhexidine Gluconate Cloth  6 each Topical Q0600   docusate sodium  100 mg Oral Daily   [START ON 06/16/2022] feeding supplement (NEPRO CARB STEADY)  237 mL Oral Once per day on Mon Wed Fri   Gerhardt's butt cream   Topical TID   guaiFENesin  600 mg Oral BID   insulin aspart  0-15 Units Subcutaneous TID WC   insulin aspart  0-5 Units Subcutaneous QHS   insulin aspart  8 Units Subcutaneous TID WC   insulin glargine-yfgn  30 Units Subcutaneous QHS   levothyroxine  50 mcg Oral QAC breakfast   midodrine  10 mg Oral Q M,W,F-HD   midodrine  5 mg Oral TID WC   multivitamin  1 tablet Oral Daily   nutrition supplement (JUVEN)  1 packet Oral BID BM   pantoprazole  40 mg Oral Daily   polyethylene glycol  17 g Oral Daily   pregabalin  75 mg Oral Daily   saccharomyces boulardii  500 mg Oral BID   senna  1 tablet Oral Daily   sodium chloride flush  3 mL Intravenous Q12H   vancomycin  125 mg Oral BID   vortioxetine  HBr  20 mg Oral q AM   zinc sulfate  220 mg Oral Daily   Continuous Infusions:  sodium chloride Stopped (06/14/22 0930)   magnesium sulfate bolus IVPB       LOS: 10 days    Time spent: 50 minutes spent on chart review, discussion with nursing staff, consultants, updating family and interview/physical exam; more than 50% of that time was spent in counseling and/or coordination of  care.    Sarahmarie Leavey J [place] DO [facility] Available via Epic secure chat 7am-7pm After these hours, please refer to coverage provider listed on [email or web address] 06/15/2022, 9:06 AM

## 2022-06-15 NOTE — Progress Notes (Signed)
[facility] Progress Note   Subjective:   Patient seen and examined at bedside.  Reports pain in RLE and nausea.  Denies CP, SOB, abdominal pain and v/d.  States she signed off dialysis early yesterday b/c she was hot.    Objective Vitals:   06/14/22 1721 06/14/22 2104 06/14/22 2146 06/15/22 0314  BP: (!) 118/46 (!) 104/43  123/67  Pulse: 92 79 80 88  Resp: 16 20    Temp: 97.6 F (36.4 C) 98.4 F (36.9 C) 98.3 F (36.8 C)   TempSrc: Oral Oral Oral   SpO2: 99% 100% 100% 93%  Weight:      Height:       Physical Exam General:ill appearing female in NAD Heart:RRR, no mrg Lungs:mostly CTAB, nml WOB on 3L O2 Abdomen:soft, NTND Extremities:1+ edema in hips. R BKA dressed Dialysis Access: R IJ TDC, LU AVF +b/t   Filed Weights   06/13/22 0700 06/13/22 2054  Weight: 93.3 kg 99.6 kg    Intake/Output Summary (Last 24 hours) at 06/15/2022 1119 Last data filed at 06/15/2022 3762 Gross per 24 hour  Intake 720 ml  Output 1.5 ml  Net 718.5 ml    Additional Objective Labs: Basic Metabolic Panel: Recent Labs  Lab 06/12/22 1152 06/13/22 0708 06/14/22 0528  NA 129* 134* 131*  K 4.5 4.1 5.0  CL 95* 98 97*  CO2 '26 29 23  '$ GLUCOSE 329* 106* 409*  BUN 31* 27* 37*  CREATININE 2.73* 2.15* 2.56*  CALCIUM 8.5* 8.8* 8.9  PHOS 3.1 3.5 4.9*   Liver Function Tests: Recent Labs  Lab 06/12/22 1152 06/13/22 0708 06/14/22 0528  ALBUMIN 2.0* 2.1* 2.2*    CBC: Recent Labs  Lab 06/09/22 0332 06/10/22 0330 06/12/22 1152 06/13/22 0708 06/14/22 0528  WBC 7.4 7.0 6.2 5.7 8.6  HGB 8.0* 7.7* 7.5* 8.2* 8.1*  HCT 27.2* 25.9* 24.9* 27.7* 26.5*  MCV 89.2 87.5 88.3 88.8 86.9  PLT 203 199 191 196 221   CBG: Recent Labs  Lab 06/14/22 1133 06/14/22 1719 06/14/22 2102 06/15/22 0715 06/15/22 1105  GLUCAP 298* 175* 213* 212* 207*   Studies/Results: DG CHEST PORT 1 VIEW  Result Date: 06/14/2022 CLINICAL DATA:  Chest pain EXAM: PORTABLE CHEST 1 VIEW COMPARISON:   06/11/2022 FINDINGS: Mild left basilar atelectasis. Lungs are otherwise clear. No pneumothorax or pleural effusion. Right internal jugular hemodialysis catheter tip noted at the superior cavoatrial junction. Cardiac size within normal limits. Pulmonary vascularity is normal. No acute bone abnormality. IMPRESSION: 1. Mild left basilar atelectasis. Electronically Signed   By: Fidela Salisbury M.D.   On: 06/14/2022 03:29    Medications:  sodium chloride Stopped (06/14/22 0930)   magnesium sulfate bolus IVPB      (feeding supplement) PROSource Plus  30 mL Oral BID BM   amiodarone  200 mg Oral Daily   vitamin C  1,000 mg Oral Daily   Chlorhexidine Gluconate Cloth  6 each Topical Q0600   docusate sodium  100 mg Oral Daily   [START ON 06/16/2022] feeding supplement (NEPRO CARB STEADY)  237 mL Oral Once per day on Mon Wed Fri   Gerhardt's butt cream   Topical TID   guaiFENesin  600 mg Oral BID   insulin aspart  0-15 Units Subcutaneous TID WC   insulin aspart  0-5 Units Subcutaneous QHS   insulin aspart  8 Units Subcutaneous TID WC   insulin glargine-yfgn  40 Units Subcutaneous QHS   levothyroxine  50 mcg Oral QAC breakfast  midodrine  10 mg Oral Q M,W,F-HD   midodrine  5 mg Oral TID WC   multivitamin  1 tablet Oral Daily   nutrition supplement (JUVEN)  1 packet Oral BID BM   pantoprazole  40 mg Oral Daily   polyethylene glycol  17 g Oral Daily   pregabalin  75 mg Oral Daily   saccharomyces boulardii  500 mg Oral BID   senna  1 tablet Oral Daily   sodium chloride flush  3 mL Intravenous Q12H   vancomycin  125 mg Oral BID   vortioxetine HBr  20 mg Oral q AM   zinc sulfate  220 mg Oral Daily    Dialysis Orders: SW MWF  4h  400/800   97kg   3K/2.5Ca bath  TDC  Hep none - last HD 1/10, post wt 98.8 - mircera 150 mcg q2, last 1/08, due 1/22   Assessment/Plan: Abd pain/ stercoral proctitis w/o evidence of perfortaion/ acute diverticulitis - per abd CT. Hx of recent Cdif infection. Po vanc  started for suspected Cdif, gen surg and ID consulted. No emergent surgical needs. On Augmentin Perisplenic collection - etiology unclear. Per CCS unlikely infectious, ?infarct vs delayed bleed vs trauma.  R BKA Dehiscence - Followed by Dr. Sharol Given. Revision yesterday.  DM2 - uncont, per pmd ESRD - on HD MWF. HD off schedule this week d/t surgery.  Back on schedule tomorrow. On midodrine HTN/ volume - sig LE / hip edema on exam. On increased O2. BP's are soft at admit but now stable. Continue max UF as tolerated. Under edw if weights correct, will need lower dry on d/c.  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 Transfuse prn.  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  Holding binders d/t low phos on admit. Not on vdra. Monitor trend.  Nutrition - Albumin 2.2, add prosource. Renal diet w/fluid restrictions.  PAF - no longer on a/c due to hx of GIB H/o PAD - sp R BKA Recent bleeding rectal ulcer - rx'd surgically dec 2023  Jen Mow, PA-C [facility] 06/15/2022,11:19 AM  LOS: 10 days

## 2022-06-15 NOTE — Evaluation (Signed)
Occupational Therapy Evaluation Patient Details Name: Susan [name] MRN: [id number] DOB: Oct 21, 1950 Today's Date: 06/15/2022   History of Present Illness 72 y.o. female who presents dehiscence and ulceration right transtibial amputation.  Patient has been admitted for medical conditions including rectal bleeding and C. difficile. On 06/13/22, pt underwent: REVISION RIGHT BELOW KNEE AMPUTATION, APPLICATION OF WOUND VAC.  Patient is currently on dialysis. 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 S/p R BKA, 03/2022 with R heel drainage AKI; DM2, CHF, HTN, CAD, CKD, MI, BLE venous insufficiency, anxiety.ESRD (TthSat) obesity, OA, CVA, Urinary incontinence   Clinical Impression   Patient is currently requiring assistance with ADLs including Total assist with bed level Lower body ADLs, up to minimal assist with Upper body ADLs,  as well as moderate assist+2 with bed mobility and inability to scoot along EOB which pt was previously doing with therapy with Mod As prior to her BKA revision on 1/19.     Current level of function is below patient's typical baseline and below pt's level of function prior to this most recent surgery.  During this evaluation, patient was limited by generalized weakness, impaired activity tolerance, and pain to RT LE and buttocks as well as cognitive defiicts, all of which has the potential to impact patient's safety and independence during functional mobility, as well as performance for ADLs.  Patient lives her spouse Susan [name], who is not able to provide consistent supervision and assistance due to working.  Patient demonstrates fair rehab potential, and should benefit from continued skilled occupational therapy services while in acute care to maximize safety, independence and quality of life at home.  Continued occupational therapy services in a SNF setting prior to return home is recommended.  ?    Recommendations for follow up therapy are one component of a multi-disciplinary discharge  planning process, led by the attending physician.  Recommendations may be updated based on patient status, additional functional criteria and insurance authorization.   Follow Up Recommendations  Skilled nursing-short term rehab (<3 hours/day)     Assistance Recommended at Discharge Frequent or constant Supervision/Assistance  Patient can return home with the following Two people to help with walking and/or transfers;Two people to help with bathing/dressing/bathroom    Functional Status Assessment  Patient has had a recent decline in their functional status and demonstrates the ability to make significant improvements in function in a reasonable and predictable amount of time.  Equipment Recommendations  Wheelchair (measurements OT);Wheelchair cushion (measurements OT);Hospital bed (Will defer to post-acute recommendations.)    Recommendations for Other Services       Precautions / Restrictions Precautions Precautions: Fall;Other (comment) Precaution Comments: recent R BKA and CDIFF Required Braces or Orthoses: Other Brace Restrictions Weight Bearing Restrictions: Yes RLE Weight Bearing: Non weight bearing      Mobility Bed Mobility Overal bed mobility: Needs Assistance Bed Mobility: Rolling, Sidelying to Sit, Sit to Supine Rolling: Min assist Sidelying to sit: Mod assist Supine to sit: Mod assist Sit to supine: Mod assist        Transfers                          Balance Overall balance assessment: Needs assistance Sitting-balance support: No upper extremity supported (Single foot supported but numb) Sitting balance-Leahy Scale: Fair Sitting balance - Comments: preference for UE support but able to rest hands in lap after sitting a few minutes. Sit balane initially poor with retropulsion but progressed to fair.  ADL either performed or assessed with clinical judgement   ADL Overall ADL's : Needs  assistance/impaired Eating/Feeding: Set up;Sitting;Bed level   Grooming: Wash/dry face;Bed level   Upper Body Bathing: Sitting;Minimal assistance   Lower Body Bathing: Total assistance;Bed level;+2 for physical assistance Lower Body Bathing Details (indicate cue type and reason): assisted patient with cleaning following bowel incontinence Upper Body Dressing : Minimal assistance;Sitting   Lower Body Dressing: Maximal assistance;Sitting/lateral leans;Bed level   Toilet Transfer: Total assistance;+2 for physical assistance;+2 for safety/equipment Toilet Transfer Details (indicate cue type and reason): Not tested. Pt unable to raise hips from EOB for scooting. Will need continued use of hoyer. Toileting- Clothing Manipulation and Hygiene: Total assistance;+2 for physical assistance;+2 for safety/equipment;Bed level Toileting - Clothing Manipulation Details (indicate cue type and reason): Continues to have bowel incontinence and unawareness of voiding. Pt not showing ability to self clean or move into sidelying without assistance.     Functional mobility during ADLs: Minimal assistance;Moderate assistance       Vision         Perception     Praxis      Pertinent Vitals/Pain Pain Assessment Pain Assessment: 0-10 Pain Score: 10-Worst pain ever Pain Location: buttocks/rectum and RT residual limb Pain Descriptors / Indicators: Discomfort, Sharp, Throbbing, Sore Pain Intervention(s): Limited activity within patient's tolerance, Monitored during session, Repositioned     Hand Dominance     Extremity/Trunk Assessment Upper Extremity Assessment Upper Extremity Assessment: RUE deficits/detail RUE Deficits / Details: Very limited shoulder flexion with pt citing RTC injury from a previous fall. Very weak grossly 2/5 shoulder, 3+/5 bicep 3-/5 tricep, grip: 4-/5 LUE Deficits / Details: AROM: WFL. MMT: grossly 3-/5 shoulder, 3+/5 bicep 3-/5 tricep, grip: 4-/5   Lower Extremity  Assessment LLE Deficits / Details: Reduced sensation due to neuropathy.   Cervical / Trunk Assessment Cervical / Trunk Assessment: Normal Cervical / Trunk Exceptions: weakness, obesity   Communication     Cognition Arousal/Alertness: Awake/alert Behavior During Therapy: Anxious, WFL for tasks assessed/performed, Impulsive Overall Cognitive Status: No family/caregiver present to determine baseline cognitive functioning Area of Impairment: Awareness, Problem solving, Memory, Attention                   Current Attention Level: Selective Memory: Decreased short-term memory (vs generalized confusion. Ex: Pt assisted back to bed, told that she was in the bed. About 1 min later pt asked, "Am I in the bed?") Following Commands: Follows one step commands consistently, Follows multi-step commands with increased time Safety/Judgement: Decreased awareness of deficits Awareness: Emergent Problem Solving: Slow processing, Difficulty sequencing, Requires verbal cues General Comments: Per note 1 week ago: "Pt reports that she wants to go to rehab, but eventually wants to go home. Currently, she requires a mechanical lift for transfers in/out of bed at SNF. Pt does not appear to realize the magnitude of her deficits at this time. She reports that her husband continues to work full time and she does not have other friends or family in the area to assist."  During this visit pt denied every being up on a lift when her husband brought it up. Pt very easily distracted and hyperfixated on anyone "bumping" her residual limb. Topics rather scattered.     General Comments       Exercises Other Exercises Other Exercises: At EOB pt used red theraband and compelted 2 sets of 10 tricep press. Pt instructed to add this to her exercises throughout the day to assist with pushing  into standing or on a RW when ready.   Shoulder Instructions      Home Living Family/patient expects to be discharged to:: Skilled  nursing facility                                        Prior Functioning/Environment                          OT Problem List: Decreased strength;Decreased activity tolerance;Impaired balance (sitting and/or standing);Obesity;Decreased safety awareness;Decreased knowledge of use of DME or AE;Decreased knowledge of precautions      OT Treatment/Interventions: Self-care/ADL training;Therapeutic exercise;DME and/or AE instruction;Therapeutic activities;Patient/family education;Balance training    OT Goals(Current goals can be found in the care plan section) Acute Rehab OT Goals Patient Stated Goal: Pain relief. To be able to tell if had a bowel movement. OT Goal Formulation: With patient Time For Goal Achievement: 06/29/22 Potential to Achieve Goals: Fair ADL Goals Pt Will Transfer to Toilet: with mod assist;with +2 assist;bedside commode Pt Will Perform Toileting - Clothing Manipulation and hygiene: with min assist;sitting/lateral leans;bed level Pt/caregiver will Perform Home Exercise Program: Increased strength;Both right and left upper extremity;With theraband Additional ADL Goal #1: Pt will be able to scoot latearlly along EOB with Min As to prepare for scoot vs sliding board transfers to drop arm wheelchair, BSC or recliner.  OT Frequency: Min 2X/week    Co-evaluation              AM-PAC OT "6 Clicks" Daily Activity     Outcome Measure Help from another person eating meals?: None Help from another person taking care of personal grooming?: A Little Help from another person toileting, which includes using toliet, bedpan, or urinal?: Total Help from another person bathing (including washing, rinsing, drying)?: A Lot Help from another person to put on and taking off regular upper body clothing?: A Little Help from another person to put on and taking off regular lower body clothing?: Total 6 Click Score: 14   End of Session Equipment Utilized During  Treatment: Oxygen Nurse Communication: Other (comment) (CNA in room for entire OT session)  Activity Tolerance: Patient limited by pain Patient left: in bed;with call bell/phone within reach;with nursing/sitter in room;with family/visitor present  OT Visit Diagnosis: Muscle weakness (generalized) (M62.81);Other abnormalities of gait and mobility (R26.89);Pain;Other symptoms and signs involving cognitive function Pain - Right/Left: Right Pain - part of body: Knee (and buttocks)                Time: 0981-1914 OT Time Calculation (min): 37 min Charges:  OT General Charges $OT Visit: 1 Visit OT Evaluation $OT Re-eval: 1 Re-eval OT Treatments $Self Care/Home Management : 8-22 mins  Anderson Malta, OT Acute Rehab Services Office: [phone] 06/15/2022  Julien Girt 06/15/2022, 3:18 PM

## 2022-06-16 ENCOUNTER — Encounter (HOSPITAL_COMMUNITY): Payer: Self-pay | Admitting: Orthopedic Surgery

## 2022-06-16 DIAGNOSIS — K5792 Diverticulitis of intestine, part unspecified, without perforation or abscess without bleeding: Secondary | ICD-10-CM | POA: Diagnosis not present

## 2022-06-16 LAB — CBC
HCT: 23.1 % — ABNORMAL LOW (ref 36.0–46.0)
HCT: 27.5 % — ABNORMAL LOW (ref 36.0–46.0)
Hemoglobin: 6.9 g/dL — CL (ref 12.0–15.0)
Hemoglobin: 8.4 g/dL — ABNORMAL LOW (ref 12.0–15.0)
MCH: 26.3 pg (ref 26.0–34.0)
MCH: 26.2 pg (ref 26.0–34.0)
MCHC: 29.9 g/dL — ABNORMAL LOW (ref 30.0–36.0)
MCHC: 30.5 g/dL (ref 30.0–36.0)
MCV: 88.2 fL (ref 80.0–100.0)
MCV: 85.7 fL (ref 80.0–100.0)
Platelets: 237 10*3/uL (ref 150–400)
Platelets: 226 10*3/uL (ref 150–400)
RBC: 2.62 MIL/uL — ABNORMAL LOW (ref 3.87–5.11)
RBC: 3.21 MIL/uL — ABNORMAL LOW (ref 3.87–5.11)
RDW: 18.2 % — ABNORMAL HIGH (ref 11.5–15.5)
RDW: 17.5 % — ABNORMAL HIGH (ref 11.5–15.5)
WBC: 8 10*3/uL (ref 4.0–10.5)
WBC: 10.6 10*3/uL — ABNORMAL HIGH (ref 4.0–10.5)
nRBC: 0 % (ref 0.0–0.2)
nRBC: 0 % (ref 0.0–0.2)

## 2022-06-16 LAB — TYPE AND SCREEN
ABO/RH(D): A POS
Antibody Screen: POSITIVE
DAT, IgG: NEGATIVE
Donor AG Type: NEGATIVE
Donor AG Type: NEGATIVE
Unit division: 0
Unit division: 0

## 2022-06-16 LAB — RENAL FUNCTION PANEL
Albumin: 2 g/dL — ABNORMAL LOW (ref 3.5–5.0)
Anion gap: 6 (ref 5–15)
BUN: 64 mg/dL — ABNORMAL HIGH (ref 8–23)
CO2: 26 mmol/L (ref 22–32)
Calcium: 8.6 mg/dL — ABNORMAL LOW (ref 8.9–10.3)
Chloride: 96 mmol/L — ABNORMAL LOW (ref 98–111)
Creatinine, Ser: 2.93 mg/dL — ABNORMAL HIGH (ref 0.44–1.00)
GFR, Estimated: 16 mL/min — ABNORMAL LOW (ref >60)
Glucose, Bld: 137 mg/dL — ABNORMAL HIGH (ref 70–99)
Phosphorus: 4.9 mg/dL — ABNORMAL HIGH (ref 2.5–4.6)
Potassium: 4.3 mmol/L (ref 3.5–5.1)
Sodium: 128 mmol/L — ABNORMAL LOW (ref 135–145)

## 2022-06-16 LAB — BPAM RBC
Blood Product Expiration Date: 202402142359
Blood Product Expiration Date: 202402142359
Unit Type and Rh: 6200
Unit Type and Rh: 6200

## 2022-06-16 LAB — PREPARE RBC (CROSSMATCH)

## 2022-06-16 LAB — GLUCOSE, CAPILLARY
Glucose-Capillary: 126 mg/dL — ABNORMAL HIGH (ref 70–99)
Glucose-Capillary: 107 mg/dL — ABNORMAL HIGH (ref 70–99)
Glucose-Capillary: 133 mg/dL — ABNORMAL HIGH (ref 70–99)

## 2022-06-16 MED ORDER — SODIUM CHLORIDE 0.9% IV SOLUTION: Freq: Once | INTRAVENOUS | Status: AC

## 2022-06-16 MED ORDER — POLYETHYLENE GLYCOL 3350 17 G PO PACK
17.0000 g | PACK | Freq: Two times a day (BID) | ORAL | Status: DC
  Administered 2022-06-16: 17 g via ORAL
  Filled 2022-06-16: qty 1

## 2022-06-16 MED ORDER — LIDOCAINE-PRILOCAINE 2.5-2.5 % EX CREA: 1.0000 | TOPICAL_CREAM | CUTANEOUS | Status: DC | PRN

## 2022-06-16 MED ORDER — LIDOCAINE HCL (PF) 1 % IJ SOLN: 5.0000 mL | INTRAMUSCULAR | Status: DC | PRN

## 2022-06-16 MED ORDER — PENTAFLUOROPROP-TETRAFLUOROETH EX AERO: 1.0000 | INHALATION_SPRAY | CUTANEOUS | Status: DC | PRN

## 2022-06-16 MED ORDER — INSULIN ASPART 100 UNIT/ML IJ SOLN
10.0000 [IU] | Freq: Three times a day (TID) | INTRAMUSCULAR | Status: DC
  Administered 2022-06-16 – 2022-06-17 (×3): 10 [IU] via SUBCUTANEOUS

## 2022-06-16 MED ORDER — HEPARIN SODIUM (PORCINE) 1000 UNIT/ML DIALYSIS
1000.0000 [IU] | INTRAMUSCULAR | Status: DC | PRN
  Filled 2022-06-16: qty 1

## 2022-06-16 MED ORDER — ALTEPLASE 2 MG IJ SOLR: 2.0000 mg | Freq: Once | INTRAMUSCULAR | Status: DC | PRN

## 2022-06-16 MED ORDER — ANTICOAGULANT SODIUM CITRATE 4% (200MG/5ML) IV SOLN: 5.0000 mL | Status: DC | PRN

## 2022-06-16 NOTE — Progress Notes (Signed)
Patient ID: Susan [name], female   DOB: Nov 11, 1950, 73 y.o.   MRN: [id number] Patient is postoperative day 3 revision right below-knee amputation patient states she is still having some pain this may be consistent with some ischemic changes.  There is no drainage in the wound VAC canister there is a good suction fit.  Plan for discharge with the Praveena plus portable wound VAC pump at discharge.  I will follow-up in the office in 1 week.

## 2022-06-16 NOTE — Progress Notes (Signed)
[facility] Progress Note   Subjective:   Seen in room. Sleeping soundly. Wound vac in place. For dialysis today  Objective Vitals:   06/15/22 2036 06/16/22 0552 06/16/22 0557 06/16/22 0909  BP: (!) 130/55  (!) 98/50 (!) 130/58  Pulse: 83  91 85  Resp: '16 13 20 18  '$ Temp: 98.4 F (36.9 C)  98.2 F (36.8 C) 98.2 F (36.8 C)  TempSrc: Oral  Oral   SpO2: 100%  99% 100%  Weight:  103 kg    Height:       Physical Exam General:ill appearing female in NAD Heart:RRR, no mrg Lungs:mostly CTAB, nml WOB on 3L O2 Abdomen:soft, NTND Extremities:1+ edema in hips. R BKA dressed Dialysis Access: R IJ TDC, LU AVF +b/t   Filed Weights   06/13/22 0700 06/13/22 2054 06/16/22 0552  Weight: 93.3 kg 99.6 kg 103 kg    Intake/Output Summary (Last 24 hours) at 06/16/2022 1119 Last data filed at 06/16/2022 0936 Gross per 24 hour  Intake 760 ml  Output 0 ml  Net 760 ml     Additional Objective Labs: Basic Metabolic Panel: Recent Labs  Lab 06/13/22 0708 06/14/22 0528 06/16/22 0343  NA 134* 131* 128*  K 4.1 5.0 4.3  CL 98 97* 96*  CO2 '29 23 26  '$ GLUCOSE 106* 409* 137*  BUN 27* 37* 64*  CREATININE 2.15* 2.56* 2.93*  CALCIUM 8.8* 8.9 8.6*  PHOS 3.5 4.9* 4.9*    Liver Function Tests: Recent Labs  Lab 06/13/22 0708 06/14/22 0528 06/16/22 0343  ALBUMIN 2.1* 2.2* 2.0*     CBC: Recent Labs  Lab 06/10/22 0330 06/12/22 1152 06/13/22 0708 06/14/22 0528 06/16/22 0343  WBC 7.0 6.2 5.7 8.6 8.0  HGB 7.7* 7.5* 8.2* 8.1* 6.9*  HCT 25.9* 24.9* 27.7* 26.5* 23.1*  MCV 87.5 88.3 88.8 86.9 88.2  PLT 199 191 196 221 237    CBG: Recent Labs  Lab 06/15/22 0715 06/15/22 1105 06/15/22 1624 06/15/22 2123 06/16/22 0725  GLUCAP 212* 207* 124* 164* 126*    Studies/Results: No results found.  Medications:  sodium chloride Stopped (06/14/22 0930)   magnesium sulfate bolus IVPB      (feeding supplement) PROSource Plus  30 mL Oral BID BM   sodium chloride    Intravenous Once   amiodarone  200 mg Oral Daily   vitamin C  1,000 mg Oral Daily   Chlorhexidine Gluconate Cloth  6 each Topical Q0600   darbepoetin (ARANESP) injection - DIALYSIS  150 mcg Subcutaneous Q Mon-1800   docusate sodium  100 mg Oral Daily   feeding supplement (NEPRO CARB STEADY)  237 mL Oral Once per day on Mon Wed Fri   Gerhardt's butt cream   Topical TID   guaiFENesin  600 mg Oral BID   insulin aspart  0-15 Units Subcutaneous TID WC   insulin aspart  0-5 Units Subcutaneous QHS   insulin aspart  10 Units Subcutaneous TID WC   insulin glargine-yfgn  40 Units Subcutaneous QHS   levothyroxine  50 mcg Oral QAC breakfast   midodrine  10 mg Oral Q M,W,F-HD   midodrine  5 mg Oral TID WC   multivitamin  1 tablet Oral Daily   nutrition supplement (JUVEN)  1 packet Oral BID BM   pantoprazole  40 mg Oral Daily   polyethylene glycol  17 g Oral BID   pregabalin  75 mg Oral Daily   saccharomyces boulardii  500 mg Oral BID   senna  1 tablet Oral Daily   sodium chloride flush  3 mL Intravenous Q12H   vancomycin  125 mg Oral BID   vortioxetine HBr  20 mg Oral q AM   zinc sulfate  220 mg Oral Daily    Dialysis Orders: SW MWF  4h  400/800   97kg   3K/2.5Ca bath  TDC  Hep none - last HD 1/10, post wt 98.8 - mircera 150 mcg q2, last 1/08, due 1/22   Assessment/Plan: Abd pain/ stercoral proctitis w/o evidence of perfortaion/ acute diverticulitis - per abd CT. Hx of recent Cdif infection. Po vanc started for suspected Cdif, gen surg and ID consulted. No emergent surgical needs. On Augmentin Perisplenic collection - etiology unclear. Per [facility] unlikely infectious, ?infarct vs delayed bleed vs trauma.  R BKA Dehiscence - Followed by Dr. Sharol Given. Revision yesterday.  DM2 - uncont, per pmd ESRD - on HD MWF. HD off schedule this week d/t surgery.  Back on schedule. HD today. On midodrine HTN/ volume - sig LE / hip edema on exam. On increased O2. BP's are soft at admit but now stable. Continue  max UF as tolerated. Under edw if weights correct, will need lower dry on d/c.  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 PRBCs ordered for 1/22.   Aranesp 150 with HD 1/22. Transfuse prn.  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  Holding binders d/t low phos on admit. Not on vdra. Monitor trend.  Nutrition - Albumin 2.2, add prosource. Renal diet w/fluid restrictions.  PAF - no longer on a/c due to hx of GIB H/o PAD - sp R BKA Recent bleeding rectal ulcer - rx'd surgically dec 2023  Lynnda Child PA-C [place] 06/16/2022,11:19 AM

## 2022-06-16 NOTE — Progress Notes (Signed)
Physical Therapy Treatment Patient Details Name: Susan [name] MRN: [id number] DOB: 07-19-1950 Today's Date: 06/16/2022   History of Present Illness 72 y.o. female who presents dehiscence and ulceration right transtibial amputation.  Patient has been admitted for medical conditions including rectal bleeding and C. difficile. On 06/13/22, pt underwent: REVISION RIGHT BELOW KNEE AMPUTATION, APPLICATION OF WOUND VAC.  Patient is currently on dialysis. 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 S/p R BKA, 03/2022 with R heel drainage AKI; DM2, CHF, HTN, CAD, CKD, MI, BLE venous insufficiency, anxiety.ESRD (TthSat) obesity, OA, CVA, Urinary incontinence    PT Comments    Tolerated treatment well. Progressing as tolerated, sitting EOB majority of session working on seated balance, weight shifting, scooting, and LE exercises. Remains motivated. Received blood products this afternoon, feeling a little better but states today has been rough overall. Pt back to pre-op mobility level with Min assist to rise to EOB, Mod A to scoot along bed. Patient will continue to benefit from skilled physical therapy services to further improve independence with functional mobility.    Recommendations for follow up therapy are one component of a multi-disciplinary discharge planning process, led by the attending physician.  Recommendations may be updated based on patient status, additional functional criteria and insurance authorization.  Follow Up Recommendations  Skilled nursing-short term rehab (<3 hours/day) Can patient physically be transported by private vehicle: No   Assistance Recommended at Discharge Intermittent Supervision/Assistance  Patient can return home with the following Two people to help with walking and/or transfers;Direct supervision/assist for medications management;Direct supervision/assist for financial management;Assist for transportation;Help with stairs or ramp for entrance;Two people to help with  bathing/dressing/bathroom;Assistance with cooking/housework   Equipment Recommendations   (TBD next venue)    Recommendations for Other Services       Precautions / Restrictions Precautions Precautions: Fall;Other (comment) Precaution Comments: recent R BKA and CDIFF Required Braces or Orthoses: Other Brace Other Brace: R BKA limb protector- skin wrapped Restrictions Weight Bearing Restrictions: Yes RLE Weight Bearing: Non weight bearing     Mobility  Bed Mobility Overal bed mobility: Needs Assistance Bed Mobility: Rolling, Sidelying to Sit, Sit to Supine Rolling: Supervision Sidelying to sit: Min assist, HOB elevated     Sit to sidelying: Min assist General bed mobility comments: Rolls with supervision in bed, cues for rail use. Min assist for trunk support to come to midline, able to push self up majority of the way. Min assist for LEs back into bed with VC.    Transfers Overall transfer level: Needs assistance Equipment used: None Transfers: Sit to/from Stand Sit to Stand: Total assist          Lateral/Scoot Transfers: Mod assist General transfer comment: Performed forward weight shift with pt pushing through LLE to initiate sit<>stand - unable to fully clear from bed but does show emerging strength and mechanics. Hands held to back of chair for support. Progressed scoot transfers (pad used to reduce friction and with Mod assist) able to scoot laterally and backwards today with cues for technique.    Ambulation/Gait                   Stairs             Wheelchair Mobility    Modified Rankin (Stroke Patients Only)       Balance Overall balance assessment: Needs assistance Sitting-balance support: No upper extremity supported (Single foot supported but numb) Sitting balance-Leahy Scale: Fair Sitting balance - Comments: Sat EOB, initially leaning left  and occasionally posterior. Improved balance with practice, able to reach down either side of  bed and return to midline with great effort.                                    Cognition Arousal/Alertness: Awake/alert Behavior During Therapy: WFL for tasks assessed/performed Overall Cognitive Status: Within Functional Limits for tasks assessed                                          Exercises Other Exercises Other Exercises: Seated LAQ bil x10 Other Exercises: Seated hip flexion x10 bil    General Comments        Pertinent Vitals/Pain Pain Assessment Pain Assessment: 0-10 Faces Pain Scale: Hurts little more Pain Location: buttocks/rectum and RT residual limb Pain Descriptors / Indicators: Discomfort, Sharp, Throbbing, Sore Pain Intervention(s): Monitored during session, Repositioned, Limited activity within patient's tolerance    Home Living                          Prior Function            PT Goals (current goals can now be found in the care plan section) Acute Rehab PT Goals Patient Stated Goal: Get well, walk again PT Goal Formulation: With patient Time For Goal Achievement: 06/21/22 Potential to Achieve Goals: Fair Progress towards PT goals: Progressing toward goals    Frequency    Min 2X/week      PT Plan Current plan remains appropriate    Co-evaluation              AM-PAC PT "6 Clicks" Mobility   Outcome Measure  Help needed turning from your back to your side while in a flat bed without using bedrails?: A Little Help needed moving from lying on your back to sitting on the side of a flat bed without using bedrails?: A Little Help needed moving to and from a bed to a chair (including a wheelchair)?: Total Help needed standing up from a chair using your arms (e.g., wheelchair or bedside chair)?: Total Help needed to walk in hospital room?: Total Help needed climbing 3-5 steps with a railing? : Total 6 Click Score: 10    End of Session Equipment Utilized During Treatment: Oxygen Activity  Tolerance: Patient tolerated treatment well Patient left: in bed;with call bell/phone within reach;with bed alarm set Nurse Communication: Mobility status PT Visit Diagnosis: Muscle weakness (generalized) (M62.81);Pain;History of falling (Z91.81);Difficulty in walking, not elsewhere classified (R26.2) Pain - Right/Left: Right Pain - part of body:  (buttocks/rectum)     Time: 0623-7628 PT Time Calculation (min) (ACUTE ONLY): 37 min  Charges:  $Therapeutic Activity: 8-22 mins $Neuromuscular Re-education: 8-22 mins                     Candie Mile, PT, DPT Physical Therapist Acute Rehabilitation Services [place] 06/16/2022, 2:15 PM

## 2022-06-16 NOTE — Progress Notes (Signed)
   06/16/22 2000  Vitals  Temp 98.4 F (36.9 C)  Temp Source Oral  BP (!) 123/58  MAP (mmHg) 78  BP Location Right Arm  BP Method Automatic  Patient Position (if appropriate) Lying  Pulse Rate 93  Pulse Rate Source Monitor  ECG Heart Rate 93  Resp 15  Post Treatment  Dialyzer Clearance Heavily streaked  Duration of HD Treatment -hour(s) 4 hour(s)  Liters Processed 73.4  Fluid Removed (mL) 3000 mL  Tolerated HD Treatment Yes   TX fin w/ one change of cartridge doe to clotting.

## 2022-06-16 NOTE — TOC Progression Note (Signed)
Transition of Care [facility]) - Initial/Assessment Note    Patient Details  Name: Susan [name] MRN: [id number] Date of Birth: 1950-10-02  Transition of Care [facility]) CM/SW Contact:    Milinda Antis, [place] Phone Number: 06/16/2022, 11:28 AM  Clinical Narrative:                 Insurance authorization with [employer] is still pending.  TOC following.  Expected Discharge Plan: Skilled Nursing Facility Barriers to Discharge: Continued Medical Work up, Ship broker   Patient Goals and CMS Choice Patient states their goals for this hospitalization and ongoing recovery are:: To return to SNF CMS Medicare.gov Compare Post Acute Care list provided to:: Patient Choice offered to / list presented to : Patient      Expected Discharge Plan and Services In-house Referral: Clinical Social Work   Post Acute Care Choice: [place] Living arrangements for the past 2 months: [place]                                      Prior Living Arrangements/Services Living arrangements for the past 2 months: [place] Lives with:: Facility Resident Patient language and need for interpreter reviewed:: Yes Do you feel safe going back to the place where you live?: Yes      Need for Family Participation in Patient Care: Yes (Comment) Care giver support system in place?: Yes (comment)   Criminal Activity/Legal Involvement Pertinent to Current Situation/Hospitalization: No - Comment as needed  Activities of Daily Living Home Assistive Devices/Equipment: Wheelchair ADL Screening (condition at time of admission) Patient's cognitive ability adequate to safely complete daily activities?: Yes Is the patient deaf or have difficulty hearing?: No Does the patient have difficulty seeing, even when wearing glasses/contacts?: No Does the patient have difficulty concentrating, remembering, or making decisions?: No Patient able to express need for  assistance with ADLs?: Yes Does the patient have difficulty dressing or bathing?: Yes Independently performs ADLs?: No Does the patient have difficulty walking or climbing stairs?: Yes Weakness of Legs: Both Weakness of Arms/Hands: None  Permission Sought/Granted   Permission granted to share information with : Yes, Verbal Permission Granted     Permission granted to share info w AGENCY: SNF        Emotional Assessment Appearance:: Appears older than stated age Attitude/Demeanor/Rapport: Engaged Affect (typically observed): Appropriate Orientation: : Oriented to Situation, Oriented to  Time, Oriented to Place, Oriented to Self Alcohol / Substance Use: Not Applicable Psych Involvement: No (comment)  Admission diagnosis:  Diverticulitis [K57.92] Spleen hematoma, initial encounter [S36.029A] Patient Active Problem List   Diagnosis Date Noted   Constipation 06/07/2022   History of Clostridioides difficile colitis 06/06/2022   Below-knee amputation of right lower extremity ([place]) 06/06/2022   Diverticulitis 06/05/2022   Stercoral colitis 06/05/2022   C. difficile colitis 06/05/2022   Spleen hematoma 06/05/2022   Dehiscence of amputation stump of right lower extremity ([place]) 06/05/2022   Rectal ulcer 05/27/2022   ESRD on dialysis ([place]) 05/27/2022   GI bleed 05/23/2022   Difficult intravenous access 05/23/2022   Gangrene of right foot ([place]) 05/02/2022   S/P BKA (below knee amputation) unilateral, right ([place]) 05/02/2022   Unspecified protein-calorie malnutrition ([place]) 04/15/2022   Secondary hyperparathyroidism of renal origin ([place]) 04/14/2022   Coagulation defect, unspecified ([place]) 04/09/2022   Acquired absence of other left toe(s) ([place]) 04/07/2022   Allergy, unspecified, initial  encounter 04/07/2022   Dependence on renal dialysis ([place]) 04/07/2022   Gout due to renal impairment, unspecified site 04/07/2022   Hypertensive heart and chronic kidney disease with heart failure and  with stage 5 chronic kidney disease, or end stage renal disease ([place]) 04/07/2022   Personal history of transient ischemic attack (TIA), and cerebral infarction without residual deficits 04/07/2022   Renal osteodystrophy 04/07/2022   Venous stasis ulcer of right calf ([place]) 03/31/2022   Fistula, colovaginal 03/26/2022   Diarrhea 03/26/2022   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   Sepsis without acute organ dysfunction ([place])    Bacteremia    Acute pancreatitis 02/01/2022   Abdominal pain 02/01/2022   SIRS (systemic inflammatory response syndrome) ([place]) 02/01/2022   Transaminitis 02/01/2022   History of anemia due to chronic kidney disease 02/01/2022   Paroxysmal atrial fibrillation ([place]) 02/01/2022   DKA (diabetic ketoacidosis) ([place]) 01/14/2022   NSTEMI (non-ST elevated myocardial infarction) ([place]) 03/05/2021   Acute renal failure superimposed on stage 4 chronic kidney disease ([place]) 08/22/2020   Hypoalbuminemia 05/25/2020   GERD (gastroesophageal reflux disease) 05/25/2020   Pressure injury of skin 05/17/2020   Type 2 diabetes mellitus with diabetic polyneuropathy, with long-term current use of insulin ([place]) 03/07/2020   Obesity, Class III, BMI 40-49.9 (morbid obesity) ([place]) 03/07/2020   Common bile duct (CBD) obstruction 05/28/2019   Benign neoplasm of ascending colon    Benign neoplasm of transverse colon    Benign neoplasm of descending colon    Benign neoplasm of sigmoid colon    Gastric polyps    Hyperkalemia 03/11/2019   Prolonged QT interval 03/11/2019   Acute blood loss anemia 03/11/2019   Onychomycosis 06/21/2018   Osteomyelitis of second toe of right foot ([place])    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 (HCC)    PVD (peripheral vascular disease) ([place]) 10/26/2017   E-coli UTI 07/27/2017   Hypothyroidism 07/27/2017   AKI (acute kidney injury) ([place])    [place] (pulmonary artery hypertension) ([place])    Impaired ambulation 07/19/2017   Nausea & vomiting  07/15/2017   Leg cramps 02/27/2017   Peripheral edema 01/12/2017   Diabetic neuropathy ([place]) 11/12/2016   CKD (chronic kidney disease), stage IV ([place]) 10/24/2015   Anemia of chronic disease 10/03/2015   Generalized anxiety disorder 10/03/2015   Insomnia 10/03/2015   Hyperglycemia due to diabetes mellitus ([place]) 06/07/2015   Chronic diastolic CHF (congestive heart failure) ([place]) 06/07/2015   Non compliance with medical treatment 04/17/2014   Rotator cuff tear 03/14/2014   Class 3 obesity ([place]) 09/23/2013   Chronic HFrEF (heart failure with reduced ejection fraction) ([place]) 06/03/2013   Hypotension 12/25/2012   Urinary incontinence    MDD (major depressive disorder) 11/12/2010   RBBB (right bundle branch block)    Coronary artery disease    Hyperlipemia 01/22/2009   Essential hypertension 01/22/2009   PCP:  [name], MD Pharmacy:   [id number] [place] [id number]: [phone]: [phone]    Social Determinants of Health (SDOH) Social History: SDOH Screenings   Food Insecurity: No Food Insecurity (05/26/2022)  Housing: Low Risk  (05/26/2022)  Transportation Needs: No Transportation Needs (05/26/2022)  Utilities: Not At Risk (05/26/2022)  Alcohol Screen: Low Risk  (08/16/2021)  Depression (PHQ2-9): Low Risk  (04/23/2022)  Financial Resource Strain: Low Risk  (08/16/2021)  Physical Activity: Inactive (08/16/2021)  Social Connections: Moderately Isolated (08/16/2021)  Stress: No Stress Concern Present (08/16/2021)  Tobacco Use: Medium Risk (06/13/2022)   SDOH Interventions:     Readmission Risk Interventions    05/12/2022    5:04 PM 02/07/2022    2:57 PM 01/23/2022   12:38 PM  Readmission Risk Prevention Plan  Transportation Screening Complete Complete Complete  PCP or Specialist Appt within 3-5 Days   Complete  HRI or [place]   Complete  Social Work  Consult for [place] Planning/Counseling   Complete  Palliative Care Screening   Not Applicable  Medication Review Press photographer)  Complete Complete  PCP or Specialist appointment within 3-5 days of discharge  Complete   HRI or [place]  Complete   SW Recovery Care/Counseling Consult Complete Complete   Palliative Care Screening  Not [place] Complete Not Applicable

## 2022-06-16 NOTE — Progress Notes (Signed)
PROGRESS NOTE    Susan [name]  MWU:[id number] DOB: 1950-12-08 DOA: 06/05/2022 PCP: [name], MD    Brief Narrative:   Susan [name] is a 72 y.o. female with past medical history significant for HTN, HLD, proximal atrial fibrillation on anticoagulation, chronic diastolic CHF, CAD, ESRD on HD MWF, chronic respiratory failure on O2, DM2, hypothyroidism, history of GI bleed, history of recurrent C. difficile colitis who presented to [name] ED on 1/11 with complaints of lower abdominal pain associate with nausea/vomiting.  Evaluation in the ED notable for severe constipation with stercoral colitis.  Patient also positive for C. difficile but likely colonization per recommendations of ID.  Patient was started on aggressive bowel regimen.  Patient received 3 days of Augmentin for sterile coral colitis and currently on 10-day course of oral vancomycin for prophylaxis for C. difficile.  Patient was also seen by orthopedics, Dr. 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  Recent admission from 12/28 until 1 8//2024 for acute lower GI bleed. GI tried to perform flexible sigmoidoscopy x 2 which were unsuccessful. General surgery was consulted and patient underwent exam under anesthesia with oversewing of rectal ulcer found on 05/24/2022 by Dr. Rich Number. She had a total of 4 units of packed red blood cell transfusion during that hospitalization and Eliquis was recommended to be discontinued.   Assessment & Plan:   Severe constipation Stecoral colitis, sigmoid diverticulitis C. difficile colitis, ruled out Patient to ED with abdominal pain.  CT Abdo/pelvis notable for large stool ball within the rectum with scattered rectal wall pneumatosis concerning for stercoral colitis, sigmoid diverticulosis with acute diverticulitis.  C. difficile was positive, seen by ID who does not think this has active C. difficile colitis.  General surgery was consulted and recommended antibiotics for CT findings.   Patient completed 3-day course of Augmentin. -- General surgery following, appreciate assistance -- Continue oral vancomycin for 10 days for prophylaxis for C. Difficile -- Colace 100 g p.o. twice daily -- Senna daily -- MiraLAX BID -- Continue to monitor bowel movements closely  Right BKA with wound dehiscence Orthopedics was consulted and patient underwent right BKA revision by Dr. Sharol Given on 06/14/2022 with placement of a wound VAC.   --NWB RLE --Pain control with oxycodone, Dilaudid as needed --Outpatient follow-up with orthopedics 1 week  Perisplenic collection CT abdomen/pelvis with contrast noted 6 x 4 x 2 cm complex subcapsular collection superior aspect of the spleen with concern for hematoma versus infectious process.  Seen by general surgery, believe possible spontaneous subcapsular hematoma.  Has been off of anticoagulation for greater than 2 weeks.  Low suspicion for infectious collection given no gas.  Hemoglobin stable.  General surgery now signed off. -- Continue to hold aspirin, DVT prophylaxis  Hx infected left AV fistula Patient underwent I&D by vascular surgery, Dr. Carlis Abbott on 05/05/2022. Vascular surgery consulted and sutures were removed to left forearm on 1/17.  Outpatient follow-up with vascular surgery.  Chronic respiratory failure, O2 dependent CT chest without contrast 1/16 with small left pleural effusion, compressive atelectasis left lower lobe, old granulomatous disease. -- Continue supplemental oxygen, maintain SpO2 greater than 92%, on 3 L nasal cannula which is her baseline -- Albuterol nebs as needed for shortness of breath/wheezing  Orthostatic hypotension/autonomic dysfunction Hx essential hypertension -- Midodrine 5 mg p.o. 3 times daily -- Midodrine 10 mg p.o. daily on Monday/Wednesday/Fridays with HD  DM2 Hemoglobin A1c 8.9 on 05/02/2022, not well-controlled.  Home regimen includes Lantus 10 units nightly,  NovoLog sliding scale, Tradjenta. -- Semglee  40 units [place] qHS -- NovoLog 10 units 3 times daily AC -- Moderate SSI for coverage -- CBGs qAC/HS  Anemia of chronic medical/renal disease Hemoglobin 6.9 this morning, will transfuse 1 unit PRBC. -- Repeat CBC in a.m.  Hypothyroidism -- Levothyroxine 50 mcg p.o. daily  Paroxysmal atrial fibrillation -- Amiodarone 200 mg p.o. daily -- No on anticoagulation due to rectal ulcer as above  Anxiety/depression -- Trintellix 20 mg p.o. every morning -- Ativan 0.5 mg p.o. every 8 hours as needed anxiety  DVT prophylaxis: SCD's Start: 06/13/22 1747 Place and maintain sequential compression device Start: 06/06/22 5093    Code Status: Full Code Family Communication: No family present at bedside this morning  Disposition Plan:  Level of care: Telemetry Medical Status is: Inpatient Remains inpatient appropriate because: Anticipate discharge back to SNF on Tuesday    Consultants:  General surgery Infection disease Orthopedics, Dr. Sharol Given Nephrology Vascular surgery  Procedures:  none  Antimicrobials:  Augmentin 1/13 - 1/15 Zosyn 1/11 - 1/12 Metronidazole 1/11 - 1/11 Oral vancomycin 1/11>> Perioperative cefazolin 1/19   Subjective: Patient seen examined bedside, resting comfortably.  Lying in bed.  RN present at bedside.  Continues to complain of pain.  Seen by orthopedics this morning.  Plan HD today.  Discussed plan discharge back to SNF tomorrow.  No other specific questions or concerns at this time.   Denies headache, no dizziness, no chest pain, no palpitations, no abdominal pain, no fever/chills/night sweats, no nausea/vomiting/diarrhea, no focal weakness, no fatigue, no paresthesias.  No acute events overnight per nursing staff.  Objective: Vitals:   06/15/22 2036 06/16/22 0552 06/16/22 0557 06/16/22 0909  BP: (!) 130/55  (!) 98/50 (!) 130/58  Pulse: 83  91 85  Resp: '16 13 20 18  '$ Temp: 98.4 F (36.9 C)  98.2 F (36.8 C) 98.2 F (36.8 C)  TempSrc: Oral  Oral   SpO2:  100%  99% 100%  Weight:  103 kg    Height:        Intake/Output Summary (Last 24 hours) at 06/16/2022 0953 Last data filed at 06/16/2022 0936 Gross per 24 hour  Intake 760 ml  Output 0 ml  Net 760 ml   Filed Weights   06/13/22 0700 06/13/22 2054 06/16/22 0552  Weight: 93.3 kg 99.6 kg 103 kg    Examination:  Physical Exam: GEN: NAD, alert and oriented x 3, chronically ill appearance HEENT: NCAT, PERRL, EOMI, sclera clear, MMM PULM: CTAB w/o wheezes/crackles, normal respiratory effort, on 3 L nasal cannula  CV: RRR w/o M/G/R GI: abd soft, NTND, NABS, no R/G/M MSK: no peripheral edema, right BKA site noted with dressing/wound VAC in place, clean/dry/intact NEURO: CN II-XII intact, no focal deficits, sensation to light touch intact PSYCH: normal mood/affect Integumentary: Right BKA site noted with wound VAC in place no other concerning rashes/lesions/wounds noted on exposed skin surfaces.      Data Reviewed: I have personally reviewed following labs and imaging studies  CBC: Recent Labs  Lab 06/10/22 0330 06/12/22 1152 06/13/22 0708 06/14/22 0528 06/16/22 0343  WBC 7.0 6.2 5.7 8.6 8.0  HGB 7.7* 7.5* 8.2* 8.1* 6.9*  HCT 25.9* 24.9* 27.7* 26.5* 23.1*  MCV 87.5 88.3 88.8 86.9 88.2  PLT 199 191 196 221 267   Basic Metabolic Panel: Recent Labs  Lab 06/10/22 0330 06/12/22 1152 06/13/22 0708 06/14/22 0528 06/16/22 0343  NA 130* 129* 134* 131* 128*  K 3.8 4.5 4.1 5.0 4.3  CL 95* 95* 98 97* 96*  CO2 '29 26 29 23 26  '$ GLUCOSE 343* 329* 106* 409* 137*  BUN 27* 31* 27* 37* 64*  CREATININE 2.30* 2.73* 2.15* 2.56* 2.93*  CALCIUM 8.5* 8.5* 8.8* 8.9 8.6*  PHOS 2.6 3.1 3.5 4.9* 4.9*   GFR: Estimated Creatinine Clearance: 20.4 mL/min (A) (by C-G formula based on SCr of 2.93 mg/dL (H)). Liver Function Tests: Recent Labs  Lab 06/12/22 1152 06/13/22 0708 06/14/22 0528 06/16/22 0343  ALBUMIN 2.0* 2.1* 2.2* 2.0*   No results for input(s): "LIPASE", "AMYLASE" in the last  168 hours.  No results for input(s): "AMMONIA" in the last 168 hours. Coagulation Profile: No results for input(s): "INR", "PROTIME" in the last 168 hours. Cardiac Enzymes: No results for input(s): "CKTOTAL", "CKMB", "CKMBINDEX", "TROPONINI" in the last 168 hours. BNP (last 3 results) No results for input(s): "PROBNP" in the last 8760 hours. HbA1C: No results for input(s): "HGBA1C" in the last 72 hours. CBG: Recent Labs  Lab 06/15/22 0715 06/15/22 1105 06/15/22 1624 06/15/22 2123 06/16/22 0725  GLUCAP 212* 207* 124* 164* 126*   Lipid Profile: No results for input(s): "CHOL", "HDL", "LDLCALC", "TRIG", "CHOLHDL", "LDLDIRECT" in the last 72 hours. Thyroid Function Tests: No results for input(s): "TSH", "T4TOTAL", "FREET4", "T3FREE", "THYROIDAB" in the last 72 hours. Anemia Panel: No results for input(s): "VITAMINB12", "FOLATE", "FERRITIN", "TIBC", "IRON", "RETICCTPCT" in the last 72 hours. Sepsis Labs: No results for input(s): "PROCALCITON", "LATICACIDVEN" in the last 168 hours.  Recent Results (from the past 240 hour(s))  Resp panel by RT-PCR (RSV, Flu A&B, Covid) Anterior Nasal Swab     Status: None   Collection Time: 06/11/22 11:19 PM   Specimen: Anterior Nasal Swab  Result Value Ref Range Status   SARS Coronavirus 2 by RT PCR NEGATIVE NEGATIVE Final    Comment: (NOTE) SARS-CoV-2 target nucleic acids are NOT DETECTED.  The SARS-CoV-2 RNA is generally detectable in upper respiratory specimens during the acute phase of infection. The lowest concentration of SARS-CoV-2 viral copies this assay can detect is 138 copies/mL. A negative result does not preclude SARS-Cov-2 infection and should not be used as the sole basis for treatment or other patient management decisions. A negative result may occur with  improper specimen collection/handling, submission of specimen other than nasopharyngeal swab, presence of viral mutation(s) within the areas targeted by this assay, and  inadequate number of viral copies(<138 copies/mL). A negative result must be combined with clinical observations, patient history, and epidemiological information. The expected result is Negative.  Fact Sheet for Patients:  EntrepreneurPulse.com.au  Fact Sheet for Healthcare Providers:  IncredibleEmployment.be  This test is no t yet approved or cleared by the [place] FDA and  has been authorized for detection and/or diagnosis of SARS-CoV-2 by FDA under an Emergency Use Authorization (EUA). This EUA will remain  in effect (meaning this test can be used) for the duration of the COVID-19 declaration under Section 564(b)(1) of the Act, 21 U.S.C.section 360bbb-3(b)(1), unless the authorization is terminated  or revoked sooner.       Influenza A by PCR NEGATIVE NEGATIVE Final   Influenza B by PCR NEGATIVE NEGATIVE Final    Comment: (NOTE) The Xpert Xpress SARS-CoV-2/FLU/RSV plus assay is intended as an aid in the diagnosis of influenza from Nasopharyngeal swab specimens and should not be used as a sole basis for treatment. Nasal washings and aspirates are unacceptable for Xpert Xpress SARS-CoV-2/FLU/RSV testing.  Fact Sheet for Patients: EntrepreneurPulse.com.au  Fact Sheet for Healthcare Providers:  IncredibleEmployment.be  This test is not yet approved or cleared by the [place] and has been authorized for detection and/or diagnosis of SARS-CoV-2 by FDA under an Emergency Use Authorization (EUA). This EUA will remain in effect (meaning this test can be used) for the duration of the COVID-19 declaration under Section 564(b)(1) of the Act, 21 U.S.C. section 360bbb-3(b)(1), unless the authorization is terminated or revoked.     Resp Syncytial Virus by PCR NEGATIVE NEGATIVE Final    Comment: (NOTE) Fact Sheet for Patients: EntrepreneurPulse.com.au  Fact Sheet for Healthcare  Providers: IncredibleEmployment.be  This test is not yet approved or cleared by the [place] FDA and has been authorized for detection and/or diagnosis of SARS-CoV-2 by FDA under an Emergency Use Authorization (EUA). This EUA will remain in effect (meaning this test can be used) for the duration of the COVID-19 declaration under Section 564(b)(1) of the Act, 21 U.S.C. section 360bbb-3(b)(1), unless the authorization is terminated or revoked.  Performed at [facility], [place] [address] [place], [place] [address]          Radiology Studies: No results found.      Scheduled Meds:  (feeding supplement) PROSource Plus  30 mL Oral BID BM   sodium chloride   Intravenous Once   amiodarone  200 mg Oral Daily   vitamin C  1,000 mg Oral Daily   Chlorhexidine Gluconate Cloth  6 each Topical Q0600   darbepoetin (ARANESP) injection - DIALYSIS  150 mcg Subcutaneous Q Mon-1800   docusate sodium  100 mg Oral Daily   feeding supplement (NEPRO CARB STEADY)  237 mL Oral Once per day on Mon Wed Fri   Gerhardt's butt cream   Topical TID   guaiFENesin  600 mg Oral BID   insulin aspart  0-15 Units Subcutaneous TID WC   insulin aspart  0-5 Units Subcutaneous QHS   insulin aspart  8 Units Subcutaneous TID WC   insulin glargine-yfgn  40 Units Subcutaneous QHS   levothyroxine  50 mcg Oral QAC breakfast   midodrine  10 mg Oral Q M,W,F-HD   midodrine  5 mg Oral TID WC   multivitamin  1 tablet Oral Daily   nutrition supplement (JUVEN)  1 packet Oral BID BM   pantoprazole  40 mg Oral Daily   polyethylene glycol  17 g Oral Daily   pregabalin  75 mg Oral Daily   saccharomyces boulardii  500 mg Oral BID   senna  1 tablet Oral Daily   sodium chloride flush  3 mL Intravenous Q12H   vancomycin  125 mg Oral BID   vortioxetine HBr  20 mg Oral q AM   zinc sulfate  220 mg Oral Daily   Continuous Infusions:  sodium chloride Stopped (06/14/22 0930)   magnesium sulfate  bolus IVPB       LOS: 11 days    Time spent: 50 minutes spent on chart review, discussion with nursing staff, consultants, updating family and interview/physical exam; more than 50% of that time was spent in counseling and/or coordination of care.    Malachi Kinzler J [place] DO [facility] Available via Epic secure chat 7am-7pm After these hours, please refer to coverage provider listed on [email or web address] 06/16/2022, 9:53 AM

## 2022-06-16 NOTE — Progress Notes (Signed)
, °  Inpatient Rehab Admissions Coordinator:    CIR consult received. Pt. Is from SNF, with plans to return to SNF (and therapy recommends SNF level rehab. CIR will sign off.   Clemens Catholic, [place], [place] Admissions Coordinator  [phone] ([place]) [phone] (office)

## 2022-06-17 DIAGNOSIS — R109 Unspecified abdominal pain: Secondary | ICD-10-CM | POA: Diagnosis not present

## 2022-06-17 DIAGNOSIS — I480 Paroxysmal atrial fibrillation: Secondary | ICD-10-CM | POA: Diagnosis present

## 2022-06-17 DIAGNOSIS — E1310 Other specified diabetes mellitus with ketoacidosis without coma: Secondary | ICD-10-CM | POA: Diagnosis not present

## 2022-06-17 DIAGNOSIS — E11649 Type 2 diabetes mellitus with hypoglycemia without coma: Secondary | ICD-10-CM | POA: Diagnosis not present

## 2022-06-17 DIAGNOSIS — R197 Diarrhea, unspecified: Secondary | ICD-10-CM | POA: Diagnosis not present

## 2022-06-17 DIAGNOSIS — L89153 Pressure ulcer of sacral region, stage 3: Secondary | ICD-10-CM | POA: Diagnosis not present

## 2022-06-17 DIAGNOSIS — E1122 Type 2 diabetes mellitus with diabetic chronic kidney disease: Secondary | ICD-10-CM | POA: Diagnosis not present

## 2022-06-17 DIAGNOSIS — I472 Ventricular tachycardia, unspecified: Secondary | ICD-10-CM | POA: Diagnosis not present

## 2022-06-17 DIAGNOSIS — D509 Iron deficiency anemia, unspecified: Secondary | ICD-10-CM | POA: Diagnosis not present

## 2022-06-17 DIAGNOSIS — Z7984 Long term (current) use of oral hypoglycemic drugs: Secondary | ICD-10-CM | POA: Diagnosis not present

## 2022-06-17 DIAGNOSIS — D631 Anemia in chronic kidney disease: Secondary | ICD-10-CM | POA: Diagnosis present

## 2022-06-17 DIAGNOSIS — G709 Myoneural disorder, unspecified: Secondary | ICD-10-CM | POA: Diagnosis not present

## 2022-06-17 DIAGNOSIS — J9611 Chronic respiratory failure with hypoxia: Secondary | ICD-10-CM | POA: Diagnosis not present

## 2022-06-17 DIAGNOSIS — K626 Ulcer of anus and rectum: Secondary | ICD-10-CM | POA: Diagnosis not present

## 2022-06-17 DIAGNOSIS — T82868A Thrombosis of vascular prosthetic devices, implants and grafts, initial encounter: Secondary | ICD-10-CM | POA: Diagnosis not present

## 2022-06-17 DIAGNOSIS — Z79890 Hormone replacement therapy (postmenopausal): Secondary | ICD-10-CM | POA: Diagnosis not present

## 2022-06-17 DIAGNOSIS — I120 Hypertensive chronic kidney disease with stage 5 chronic kidney disease or end stage renal disease: Secondary | ICD-10-CM | POA: Diagnosis not present

## 2022-06-17 DIAGNOSIS — E1165 Type 2 diabetes mellitus with hyperglycemia: Secondary | ICD-10-CM | POA: Diagnosis not present

## 2022-06-17 DIAGNOSIS — K229 Disease of esophagus, unspecified: Secondary | ICD-10-CM | POA: Diagnosis not present

## 2022-06-17 DIAGNOSIS — N390 Urinary tract infection, site not specified: Secondary | ICD-10-CM | POA: Diagnosis present

## 2022-06-17 DIAGNOSIS — R2681 Unsteadiness on feet: Secondary | ICD-10-CM | POA: Diagnosis not present

## 2022-06-17 DIAGNOSIS — K5130 Ulcerative (chronic) rectosigmoiditis without complications: Secondary | ICD-10-CM | POA: Diagnosis present

## 2022-06-17 DIAGNOSIS — I5032 Chronic diastolic (congestive) heart failure: Secondary | ICD-10-CM | POA: Diagnosis not present

## 2022-06-17 DIAGNOSIS — R000 Tachycardia, unspecified: Secondary | ICD-10-CM | POA: Diagnosis not present

## 2022-06-17 DIAGNOSIS — R008 Other abnormalities of heart beat: Secondary | ICD-10-CM | POA: Diagnosis not present

## 2022-06-17 DIAGNOSIS — N179 Acute kidney failure, unspecified: Secondary | ICD-10-CM | POA: Diagnosis not present

## 2022-06-17 DIAGNOSIS — L89152 Pressure ulcer of sacral region, stage 2: Secondary | ICD-10-CM | POA: Diagnosis present

## 2022-06-17 DIAGNOSIS — I959 Hypotension, unspecified: Secondary | ICD-10-CM | POA: Diagnosis not present

## 2022-06-17 DIAGNOSIS — M6281 Muscle weakness (generalized): Secondary | ICD-10-CM | POA: Diagnosis not present

## 2022-06-17 DIAGNOSIS — K219 Gastro-esophageal reflux disease without esophagitis: Secondary | ICD-10-CM | POA: Diagnosis not present

## 2022-06-17 DIAGNOSIS — R1312 Dysphagia, oropharyngeal phase: Secondary | ICD-10-CM | POA: Diagnosis not present

## 2022-06-17 DIAGNOSIS — D6869 Other thrombophilia: Secondary | ICD-10-CM | POA: Diagnosis present

## 2022-06-17 DIAGNOSIS — I4891 Unspecified atrial fibrillation: Secondary | ICD-10-CM | POA: Diagnosis not present

## 2022-06-17 DIAGNOSIS — K5289 Other specified noninfective gastroenteritis and colitis: Secondary | ICD-10-CM | POA: Diagnosis not present

## 2022-06-17 DIAGNOSIS — Z79899 Other long term (current) drug therapy: Secondary | ICD-10-CM | POA: Diagnosis not present

## 2022-06-17 DIAGNOSIS — F419 Anxiety disorder, unspecified: Secondary | ICD-10-CM | POA: Diagnosis not present

## 2022-06-17 DIAGNOSIS — D696 Thrombocytopenia, unspecified: Secondary | ICD-10-CM | POA: Diagnosis present

## 2022-06-17 DIAGNOSIS — I5020 Unspecified systolic (congestive) heart failure: Secondary | ICD-10-CM | POA: Diagnosis not present

## 2022-06-17 DIAGNOSIS — R933 Abnormal findings on diagnostic imaging of other parts of digestive tract: Secondary | ICD-10-CM | POA: Diagnosis not present

## 2022-06-17 DIAGNOSIS — N185 Chronic kidney disease, stage 5: Secondary | ICD-10-CM | POA: Diagnosis not present

## 2022-06-17 DIAGNOSIS — I110 Hypertensive heart disease with heart failure: Secondary | ICD-10-CM | POA: Diagnosis not present

## 2022-06-17 DIAGNOSIS — I2510 Atherosclerotic heart disease of native coronary artery without angina pectoris: Secondary | ICD-10-CM | POA: Diagnosis not present

## 2022-06-17 DIAGNOSIS — I132 Hypertensive heart and chronic kidney disease with heart failure and with stage 5 chronic kidney disease, or end stage renal disease: Secondary | ICD-10-CM | POA: Diagnosis not present

## 2022-06-17 DIAGNOSIS — Z794 Long term (current) use of insulin: Secondary | ICD-10-CM | POA: Diagnosis not present

## 2022-06-17 DIAGNOSIS — Z89512 Acquired absence of left leg below knee: Secondary | ICD-10-CM | POA: Diagnosis not present

## 2022-06-17 DIAGNOSIS — R110 Nausea: Secondary | ICD-10-CM | POA: Diagnosis not present

## 2022-06-17 DIAGNOSIS — I484 Atypical atrial flutter: Secondary | ICD-10-CM | POA: Diagnosis not present

## 2022-06-17 DIAGNOSIS — Z23 Encounter for immunization: Secondary | ICD-10-CM | POA: Diagnosis not present

## 2022-06-17 DIAGNOSIS — E8770 Fluid overload, unspecified: Secondary | ICD-10-CM | POA: Diagnosis not present

## 2022-06-17 DIAGNOSIS — I9589 Other hypotension: Secondary | ICD-10-CM | POA: Diagnosis present

## 2022-06-17 DIAGNOSIS — I5043 Acute on chronic combined systolic (congestive) and diastolic (congestive) heart failure: Secondary | ICD-10-CM | POA: Diagnosis not present

## 2022-06-17 DIAGNOSIS — Z955 Presence of coronary angioplasty implant and graft: Secondary | ICD-10-CM | POA: Diagnosis not present

## 2022-06-17 DIAGNOSIS — L089 Local infection of the skin and subcutaneous tissue, unspecified: Secondary | ICD-10-CM | POA: Diagnosis not present

## 2022-06-17 DIAGNOSIS — I5021 Acute systolic (congestive) heart failure: Secondary | ICD-10-CM | POA: Diagnosis present

## 2022-06-17 DIAGNOSIS — R2689 Other abnormalities of gait and mobility: Secondary | ICD-10-CM | POA: Diagnosis not present

## 2022-06-17 DIAGNOSIS — E559 Vitamin D deficiency, unspecified: Secondary | ICD-10-CM | POA: Diagnosis not present

## 2022-06-17 DIAGNOSIS — T8130XD Disruption of wound, unspecified, subsequent encounter: Secondary | ICD-10-CM | POA: Diagnosis not present

## 2022-06-17 DIAGNOSIS — I25119 Atherosclerotic heart disease of native coronary artery with unspecified angina pectoris: Secondary | ICD-10-CM | POA: Diagnosis not present

## 2022-06-17 DIAGNOSIS — E1152 Type 2 diabetes mellitus with diabetic peripheral angiopathy with gangrene: Secondary | ICD-10-CM | POA: Diagnosis present

## 2022-06-17 DIAGNOSIS — I739 Peripheral vascular disease, unspecified: Secondary | ICD-10-CM | POA: Diagnosis not present

## 2022-06-17 DIAGNOSIS — D735 Infarction of spleen: Secondary | ICD-10-CM | POA: Diagnosis present

## 2022-06-17 DIAGNOSIS — I4892 Unspecified atrial flutter: Secondary | ICD-10-CM | POA: Diagnosis present

## 2022-06-17 DIAGNOSIS — R112 Nausea with vomiting, unspecified: Secondary | ICD-10-CM | POA: Diagnosis not present

## 2022-06-17 DIAGNOSIS — E1142 Type 2 diabetes mellitus with diabetic polyneuropathy: Secondary | ICD-10-CM | POA: Diagnosis not present

## 2022-06-17 DIAGNOSIS — B3781 Candidal esophagitis: Secondary | ICD-10-CM | POA: Diagnosis not present

## 2022-06-17 DIAGNOSIS — R1013 Epigastric pain: Secondary | ICD-10-CM | POA: Diagnosis not present

## 2022-06-17 DIAGNOSIS — R531 Weakness: Secondary | ICD-10-CM | POA: Diagnosis not present

## 2022-06-17 DIAGNOSIS — K5792 Diverticulitis of intestine, part unspecified, without perforation or abscess without bleeding: Secondary | ICD-10-CM | POA: Diagnosis not present

## 2022-06-17 DIAGNOSIS — N186 End stage renal disease: Secondary | ICD-10-CM | POA: Diagnosis not present

## 2022-06-17 DIAGNOSIS — I69828 Other speech and language deficits following other cerebrovascular disease: Secondary | ICD-10-CM | POA: Diagnosis not present

## 2022-06-17 DIAGNOSIS — Z87891 Personal history of nicotine dependence: Secondary | ICD-10-CM | POA: Diagnosis not present

## 2022-06-17 DIAGNOSIS — I5022 Chronic systolic (congestive) heart failure: Secondary | ICD-10-CM | POA: Diagnosis not present

## 2022-06-17 DIAGNOSIS — D689 Coagulation defect, unspecified: Secondary | ICD-10-CM | POA: Diagnosis not present

## 2022-06-17 DIAGNOSIS — I509 Heart failure, unspecified: Secondary | ICD-10-CM | POA: Diagnosis not present

## 2022-06-17 DIAGNOSIS — M1990 Unspecified osteoarthritis, unspecified site: Secondary | ICD-10-CM | POA: Diagnosis not present

## 2022-06-17 DIAGNOSIS — R609 Edema, unspecified: Secondary | ICD-10-CM | POA: Diagnosis not present

## 2022-06-17 DIAGNOSIS — Z992 Dependence on renal dialysis: Secondary | ICD-10-CM | POA: Diagnosis not present

## 2022-06-17 DIAGNOSIS — L89223 Pressure ulcer of left hip, stage 3: Secondary | ICD-10-CM | POA: Diagnosis not present

## 2022-06-17 DIAGNOSIS — N280 Ischemia and infarction of kidney: Secondary | ICD-10-CM | POA: Diagnosis present

## 2022-06-17 DIAGNOSIS — I5042 Chronic combined systolic (congestive) and diastolic (congestive) heart failure: Secondary | ICD-10-CM | POA: Diagnosis not present

## 2022-06-17 DIAGNOSIS — J9610 Chronic respiratory failure, unspecified whether with hypoxia or hypercapnia: Secondary | ICD-10-CM | POA: Diagnosis not present

## 2022-06-17 DIAGNOSIS — R41841 Cognitive communication deficit: Secondary | ICD-10-CM | POA: Diagnosis not present

## 2022-06-17 DIAGNOSIS — E08311 Diabetes mellitus due to underlying condition with unspecified diabetic retinopathy with macular edema: Secondary | ICD-10-CM | POA: Diagnosis not present

## 2022-06-17 DIAGNOSIS — R1084 Generalized abdominal pain: Secondary | ICD-10-CM | POA: Diagnosis not present

## 2022-06-17 DIAGNOSIS — T8189XA Other complications of procedures, not elsewhere classified, initial encounter: Secondary | ICD-10-CM | POA: Diagnosis not present

## 2022-06-17 DIAGNOSIS — N2581 Secondary hyperparathyroidism of renal origin: Secondary | ICD-10-CM | POA: Diagnosis present

## 2022-06-17 DIAGNOSIS — E039 Hypothyroidism, unspecified: Secondary | ICD-10-CM | POA: Diagnosis present

## 2022-06-17 DIAGNOSIS — Z7401 Bed confinement status: Secondary | ICD-10-CM | POA: Diagnosis not present

## 2022-06-17 DIAGNOSIS — N250 Renal osteodystrophy: Secondary | ICD-10-CM | POA: Diagnosis not present

## 2022-06-17 DIAGNOSIS — I252 Old myocardial infarction: Secondary | ICD-10-CM | POA: Diagnosis not present

## 2022-06-17 DIAGNOSIS — E871 Hypo-osmolality and hyponatremia: Secondary | ICD-10-CM | POA: Diagnosis not present

## 2022-06-17 DIAGNOSIS — Z89511 Acquired absence of right leg below knee: Secondary | ICD-10-CM | POA: Diagnosis not present

## 2022-06-17 DIAGNOSIS — A0472 Enterocolitis due to Clostridium difficile, not specified as recurrent: Secondary | ICD-10-CM | POA: Diagnosis not present

## 2022-06-17 DIAGNOSIS — I10 Essential (primary) hypertension: Secondary | ICD-10-CM | POA: Diagnosis not present

## 2022-06-17 DIAGNOSIS — Z4781 Encounter for orthopedic aftercare following surgical amputation: Secondary | ICD-10-CM | POA: Diagnosis not present

## 2022-06-17 LAB — CBC
HCT: 25.1 % — ABNORMAL LOW (ref 36.0–46.0)
Hemoglobin: 7.8 g/dL — ABNORMAL LOW (ref 12.0–15.0)
MCH: 26.4 pg (ref 26.0–34.0)
MCHC: 31.1 g/dL (ref 30.0–36.0)
MCV: 84.8 fL (ref 80.0–100.0)
Platelets: 233 10*3/uL (ref 150–400)
RBC: 2.96 MIL/uL — ABNORMAL LOW (ref 3.87–5.11)
RDW: 17.6 % — ABNORMAL HIGH (ref 11.5–15.5)
WBC: 9.6 10*3/uL (ref 4.0–10.5)
nRBC: 0 % (ref 0.0–0.2)

## 2022-06-17 LAB — GLUCOSE, CAPILLARY
Glucose-Capillary: 180 mg/dL — ABNORMAL HIGH (ref 70–99)
Glucose-Capillary: 154 mg/dL — ABNORMAL HIGH (ref 70–99)

## 2022-06-17 MED ORDER — OXYCODONE-ACETAMINOPHEN 5-325 MG PO TABS: 1.0000 | ORAL_TABLET | ORAL | 0 refills | Status: DC | PRN

## 2022-06-17 MED ORDER — POLYETHYLENE GLYCOL 3350 17 G PO PACK: 17.0000 g | PACK | Freq: Two times a day (BID) | ORAL | 0 refills | Status: DC

## 2022-06-17 MED ORDER — LORAZEPAM 0.5 MG PO TABS: 0.5000 mg | ORAL_TABLET | Freq: Three times a day (TID) | ORAL | 0 refills | Status: DC | PRN

## 2022-06-17 MED ORDER — LANTUS SOLOSTAR 100 UNIT/ML ~~LOC~~ SOPN: 40.0000 [IU] | PEN_INJECTOR | Freq: Every day | SUBCUTANEOUS | 11 refills | Status: DC

## 2022-06-17 MED ORDER — SENNOSIDES-DOCUSATE SODIUM 8.6-50 MG PO TABS: 2.0000 | ORAL_TABLET | Freq: Two times a day (BID) | ORAL | Status: DC

## 2022-06-17 NOTE — TOC Progression Note (Signed)
Transition of Care [facility]) - Initial/Assessment Note    Patient Details  Name: Susan [name] MRN: [id number] Date of Birth: 1950-07-24  Transition of Care [facility]) CM/SW Contact:    Milinda Antis, [place] Phone Number: 06/17/2022, 8:48 AM  Clinical Narrative:                 Insurance authorization has been approved.   SNF authorization number= [id number] Ambulance authorization number= [id number]  TOC following.  Expected Discharge Plan: Skilled Nursing Facility Barriers to Discharge: Continued Medical Work up, Ship broker   Patient Goals and CMS Choice Patient states their goals for this hospitalization and ongoing recovery are:: To return to SNF CMS Medicare.gov Compare Post Acute Care list provided to:: Patient Choice offered to / list presented to : Patient      Expected Discharge Plan and Services In-house Referral: Clinical Social Work   Post Acute Care Choice: [place] Living arrangements for the past 2 months: [place]                                      Prior Living Arrangements/Services Living arrangements for the past 2 months: [place] Lives with:: Facility Resident Patient language and need for interpreter reviewed:: Yes Do you feel safe going back to the place where you live?: Yes      Need for Family Participation in Patient Care: Yes (Comment) Care giver support system in place?: Yes (comment)   Criminal Activity/Legal Involvement Pertinent to Current Situation/Hospitalization: No - Comment as needed  Activities of Daily Living Home Assistive Devices/Equipment: Wheelchair ADL Screening (condition at time of admission) Patient's cognitive ability adequate to safely complete daily activities?: Yes Is the patient deaf or have difficulty hearing?: No Does the patient have difficulty seeing, even when wearing glasses/contacts?: No Does the patient have difficulty concentrating, remembering, or  making decisions?: No Patient able to express need for assistance with ADLs?: Yes Does the patient have difficulty dressing or bathing?: Yes Independently performs ADLs?: No Does the patient have difficulty walking or climbing stairs?: Yes Weakness of Legs: Both Weakness of Arms/Hands: None  Permission Sought/Granted   Permission granted to share information with : Yes, Verbal Permission Granted     Permission granted to share info w AGENCY: SNF        Emotional Assessment Appearance:: Appears older than stated age Attitude/Demeanor/Rapport: Engaged Affect (typically observed): Appropriate Orientation: : Oriented to Situation, Oriented to  Time, Oriented to Place, Oriented to Self Alcohol / Substance Use: Not Applicable Psych Involvement: No (comment)  Admission diagnosis:  Diverticulitis [K57.92] Spleen hematoma, initial encounter [S36.029A] Patient Active Problem List   Diagnosis Date Noted   Constipation 06/07/2022   History of Clostridioides difficile colitis 06/06/2022   Below-knee amputation of right lower extremity ([place]) 06/06/2022   Diverticulitis 06/05/2022   Stercoral colitis 06/05/2022   C. difficile colitis 06/05/2022   Spleen hematoma 06/05/2022   Dehiscence of amputation stump of right lower extremity ([place]) 06/05/2022   Rectal ulcer 05/27/2022   ESRD on dialysis ([place]) 05/27/2022   GI bleed 05/23/2022   Difficult intravenous access 05/23/2022   Gangrene of right foot ([place]) 05/02/2022   S/P BKA (below knee amputation) unilateral, right ([place]) 05/02/2022   Unspecified protein-calorie malnutrition ([place]) 04/15/2022   Secondary hyperparathyroidism of renal origin ([place]) 04/14/2022   Coagulation defect, unspecified ([place]) 04/09/2022   Acquired absence of other left toe(s) (  [place]) 04/07/2022   Allergy, unspecified, initial encounter 04/07/2022   Dependence on renal dialysis ([place]) 04/07/2022   Gout due to renal impairment, unspecified site 04/07/2022   Hypertensive  heart and chronic kidney disease with heart failure and with stage 5 chronic kidney disease, or end stage renal disease ([place]) 04/07/2022   Personal history of transient ischemic attack (TIA), and cerebral infarction without residual deficits 04/07/2022   Renal osteodystrophy 04/07/2022   Venous stasis ulcer of right calf ([place]) 03/31/2022   Fistula, colovaginal 03/26/2022   Diarrhea 03/26/2022   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   Sepsis without acute organ dysfunction ([place])    Bacteremia    Acute pancreatitis 02/01/2022   Abdominal pain 02/01/2022   SIRS (systemic inflammatory response syndrome) ([place]) 02/01/2022   Transaminitis 02/01/2022   History of anemia due to chronic kidney disease 02/01/2022   Paroxysmal atrial fibrillation ([place]) 02/01/2022   DKA (diabetic ketoacidosis) ([place]) 01/14/2022   NSTEMI (non-ST elevated myocardial infarction) ([place]) 03/05/2021   Acute renal failure superimposed on stage 4 chronic kidney disease ([place]) 08/22/2020   Hypoalbuminemia 05/25/2020   GERD (gastroesophageal reflux disease) 05/25/2020   Pressure injury of skin 05/17/2020   Type 2 diabetes mellitus with diabetic polyneuropathy, with long-term current use of insulin ([place]) 03/07/2020   Obesity, Class III, BMI 40-49.9 (morbid obesity) ([place]) 03/07/2020   Common bile duct (CBD) obstruction 05/28/2019   Benign neoplasm of ascending colon    Benign neoplasm of transverse colon    Benign neoplasm of descending colon    Benign neoplasm of sigmoid colon    Gastric polyps    Hyperkalemia 03/11/2019   Prolonged QT interval 03/11/2019   Acute blood loss anemia 03/11/2019   Onychomycosis 06/21/2018   Osteomyelitis of second toe of right foot ([place])    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 (HCC)    PVD (peripheral vascular disease) ([place]) 10/26/2017   E-coli UTI 07/27/2017   Hypothyroidism 07/27/2017   AKI (acute kidney injury) ([place])    [place] (pulmonary artery hypertension) ([place])     Impaired ambulation 07/19/2017   Nausea & vomiting 07/15/2017   Leg cramps 02/27/2017   Peripheral edema 01/12/2017   Diabetic neuropathy ([place]) 11/12/2016   CKD (chronic kidney disease), stage IV ([place]) 10/24/2015   Anemia of chronic disease 10/03/2015   Generalized anxiety disorder 10/03/2015   Insomnia 10/03/2015   Hyperglycemia due to diabetes mellitus ([place]) 06/07/2015   Chronic diastolic CHF (congestive heart failure) ([place]) 06/07/2015   Non compliance with medical treatment 04/17/2014   Rotator cuff tear 03/14/2014   Class 3 obesity ([place]) 09/23/2013   Chronic HFrEF (heart failure with reduced ejection fraction) ([place]) 06/03/2013   Hypotension 12/25/2012   Urinary incontinence    MDD (major depressive disorder) 11/12/2010   RBBB (right bundle branch block)    Coronary artery disease    Hyperlipemia 01/22/2009   Essential hypertension 01/22/2009   PCP:  [name], MD Pharmacy:   [id number] [place] [place] [id number]: [phone]: [phone]    Social Determinants of Health (SDOH) Social History: SDOH Screenings   Food Insecurity: No Food Insecurity (05/26/2022)  Housing: Low Risk  (05/26/2022)  Transportation Needs: No Transportation Needs (05/26/2022)  Utilities: Not At Risk (05/26/2022)  Alcohol Screen: Low Risk  (08/16/2021)  Depression (PHQ2-9): Low Risk  (04/23/2022)  Financial Resource Strain: Low Risk  (08/16/2021)  Physical Activity: Inactive (  08/16/2021)  Social Connections: Moderately Isolated (08/16/2021)  Stress: No Stress Concern Present (08/16/2021)  Tobacco Use: Medium Risk (06/16/2022)   SDOH Interventions:     Readmission Risk Interventions    05/12/2022    5:04 PM 02/07/2022    2:57 PM 01/23/2022   12:38 PM  Readmission Risk Prevention Plan  Transportation Screening Complete Complete Complete  PCP or Specialist Appt within 3-5 Days   Complete   HRI or [place]   Complete  Social Work Consult for [place] Planning/Counseling   Complete  Palliative Care Screening   Not Applicable  Medication Review Press photographer)  Complete Complete  PCP or Specialist appointment within 3-5 days of discharge  Complete   HRI or [place]  Complete   SW Recovery Care/Counseling Consult Complete Complete   Palliative Care Screening  Not [place] Complete Not Applicable

## 2022-06-17 NOTE — Progress Notes (Signed)
1/23 Patient under Contact Precaution. Call placed to patient's daughter [name]) and IMM Letter was acknowledged, letter will be mailed to the address on file.

## 2022-06-17 NOTE — Progress Notes (Signed)
Pt to d/c to snf today. Contacted [facility] SW to advise clinic of pt's d/c today and that pt will resume care tomorrow. Pt has a MWF 11:15 am chair time.   Melven Sartorius Renal Navigator [phone]

## 2022-06-17 NOTE — Care Management Important Message (Signed)
Important Message  Patient Details  Name: Susan [name] MRN: [id number] Date of Birth: 13-Jun-1950   Medicare Important Message Given:  Other (see comment)     Hannah Beat 06/17/2022, 2:05 PM

## 2022-06-17 NOTE — Progress Notes (Signed)
[facility] Progress Note   Subjective:   Completed dialysis yesterday --net UF 3L.  Did feel sick afterwards.  Seen in room. Alert, good spirits. Discharge back to [facility] today.    Objective Vitals:   06/16/22 2000 06/16/22 2044 06/17/22 0510 06/17/22 0900  BP: (!) 123/58 (!) 115/49 (!) 130/52 (!) 134/58  Pulse: 93 90 91 84  Resp: '15 18 18 16  '$ Temp: 98.4 F (36.9 C) 98.5 F (36.9 C) 98.2 F (36.8 C) 98.2 F (36.8 C)  TempSrc: Oral Oral Oral   SpO2: (!) 3% 99% 99% 99%  Weight: 100 kg     Height:       Physical Exam General: lying in bed, nad  Heart: RRR, no mrg Lungs: mostly CTAB, nml WOB on 3L O2 Abdomen: soft, NTND Extremities: 1+ edema in hips. R BKA dressed Dialysis Access: R IJ TDC, LU AVF +b/t   Filed Weights   06/16/22 0552 06/16/22 1449 06/16/22 2000  Weight: 103 kg 103 kg 100 kg    Intake/Output Summary (Last 24 hours) at 06/17/2022 1114 Last data filed at 06/17/2022 0800 Gross per 24 hour  Intake 1697.51 ml  Output 3000 ml  Net -1302.49 ml     Additional Objective Labs: Basic Metabolic Panel: Recent Labs  Lab 06/13/22 0708 06/14/22 0528 06/16/22 0343  NA 134* 131* 128*  K 4.1 5.0 4.3  CL 98 97* 96*  CO2 '29 23 26  '$ GLUCOSE 106* 409* 137*  BUN 27* 37* 64*  CREATININE 2.15* 2.56* 2.93*  CALCIUM 8.8* 8.9 8.6*  PHOS 3.5 4.9* 4.9*    Liver Function Tests: Recent Labs  Lab 06/13/22 0708 06/14/22 0528 06/16/22 0343  ALBUMIN 2.1* 2.2* 2.0*     CBC: Recent Labs  Lab 06/13/22 0708 06/14/22 0528 06/16/22 0343 06/16/22 2134 06/17/22 0658  WBC 5.7 8.6 8.0 10.6* 9.6  HGB 8.2* 8.1* 6.9* 8.4* 7.8*  HCT 27.7* 26.5* 23.1* 27.5* 25.1*  MCV 88.8 86.9 88.2 85.7 84.8  PLT 196 221 237 226 233    CBG: Recent Labs  Lab 06/15/22 2123 06/16/22 0725 06/16/22 1123 06/16/22 2044 06/17/22 0730  GLUCAP 164* 126* 107* 133* 180*    Studies/Results: No results found.  Medications:  magnesium sulfate bolus IVPB       (feeding supplement) PROSource Plus  30 mL Oral BID BM   amiodarone  200 mg Oral Daily   vitamin C  1,000 mg Oral Daily   Chlorhexidine Gluconate Cloth  6 each Topical Q0600   darbepoetin (ARANESP) injection - DIALYSIS  150 mcg Subcutaneous Q Mon-1800   docusate sodium  100 mg Oral Daily   feeding supplement (NEPRO CARB STEADY)  237 mL Oral Once per day on Mon Wed Fri   Gerhardt's butt cream   Topical TID   guaiFENesin  600 mg Oral BID   insulin aspart  0-15 Units Subcutaneous TID WC   insulin aspart  0-5 Units Subcutaneous QHS   insulin aspart  10 Units Subcutaneous TID WC   insulin glargine-yfgn  40 Units Subcutaneous QHS   levothyroxine  50 mcg Oral QAC breakfast   midodrine  10 mg Oral Q M,W,F-HD   midodrine  5 mg Oral TID WC   multivitamin  1 tablet Oral Daily   nutrition supplement (JUVEN)  1 packet Oral BID BM   pantoprazole  40 mg Oral Daily   polyethylene glycol  17 g Oral BID   pregabalin  75 mg Oral Daily  saccharomyces boulardii  500 mg Oral BID   senna  1 tablet Oral Daily   sodium chloride flush  3 mL Intravenous Q12H   vortioxetine HBr  20 mg Oral q AM   zinc sulfate  220 mg Oral Daily    Dialysis Orders: SW MWF  4h  400/800   97kg   3K/2.5Ca bath  TDC  Hep none - last HD 1/10, post wt 98.8 - mircera 150 mcg q2, last 1/08, due 1/22   Assessment/Plan: Abd pain/ stercoral proctitis w/o evidence of perfortaion/ acute diverticulitis - per abd CT. Hx of recent Cdif infection. Po vanc started for suspected Cdif, gen surg and ID consulted.  Completed antibiotics.  Perisplenic collection - etiology unclear. Per [facility] unlikely infectious, ?infarct vs delayed bleed vs trauma.  R BKA Dehiscence - Followed by Dr. Sharol Given. Had revision surgery  1/19 DM2 - uncont, per pmd ESRD - on HD MWF.  Back on schedule. Next HD 1/24 On midodrine HTN/ volume - sig LE / hip edema on exam. On increased O2. BP's are soft at admit but now stable. Continue max UF as tolerated. Doesn't tolerate  large goals well.  Anemia esrd - 7.8 s/p . PRBCs  1/22.   Aranesp 150 with HD 1/22. Transfuse prn.  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  Holding binders d/t low phos on admit. Not on vdra. Monitor trend.  Nutrition - Albumin 2.2, add prosource. Renal diet w/fluid restrictions.  PAF - no longer on a/c due to hx of GIB Dispo -- discharge to SNF today    Lynnda Child PA-C [facility] 06/17/2022,11:14 AM

## 2022-06-17 NOTE — Discharge Summary (Signed)
Physician Discharge Summary  Susan [name] VOH:[id number] DOB: 1951-03-09 DOA: 06/05/2022  PCP: [name], MD  Admit date: 06/05/2022 Discharge date: 06/17/2022  Admitted From: [facility] Disposition: [facility]  Recommendations for Outpatient Follow-up:  Follow up with PCP in 1-2 weeks Follow-up with vascular surgery 2 weeks, Dr. Carlis Abbott Follow-up with orthopedics, Dr. Sharol Given 1 week Needs follow-up with general surgery for follow-up regarding perisplenic fluid collection with CT abdomen/pelvis with IV contrast Continue aggressive bowel regimen to avoid constipation  Home Health: N/A Equipment/Devices: Prevena wound VAC to right stump  Discharge Condition: Stable CODE STATUS: Full code Diet recommendation: Renal diet with 1200 mL fluid restriction  History of present illness:  Susan [name] is a 72 y.o. female with past medical history significant for HTN, HLD, proximal atrial fibrillation on anticoagulation, chronic diastolic CHF, CAD, ESRD on HD MWF, chronic respiratory failure on O2, DM2, hypothyroidism, history of GI bleed, history of recurrent C. difficile colitis who presented to [facility] ED on 1/11 with complaints of lower abdominal pain associate with nausea/vomiting.  Evaluation in the ED notable for severe constipation with stercoral colitis.  Patient also positive for C. difficile but likely colonization per recommendations of ID.  Patient was started on aggressive bowel regimen.  Patient received 3 days of Augmentin for sterile coral colitis and currently on 10-day course of oral vancomycin for prophylaxis for C. difficile.  Patient was also seen by orthopedics, Dr. 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   Recent admission from 12/28 until 1 8//2024 for acute lower GI bleed. GI tried to perform flexible sigmoidoscopy x 2 which were unsuccessful. General surgery was consulted and patient underwent exam under anesthesia with oversewing of rectal ulcer found on  05/24/2022 by Dr. Rich Number. She had a total of 4 units of packed red blood cell transfusion during that hospitalization and Eliquis was recommended to be discontinued.   Hospital course:  Severe constipation Stecoral colitis, sigmoid diverticulitis C. difficile colitis, ruled out Patient to ED with abdominal pain.  CT Abdo/pelvis notable for large stool ball within the rectum with scattered rectal wall pneumatosis concerning for stercoral colitis, sigmoid diverticulosis with acute diverticulitis.  C. difficile was positive, seen by ID who does not think this has active C. difficile colitis.  General surgery was consulted and recommended antibiotics for CT findings.  Patient completed 3-day course of Augmentin and 10-day course of prophylaxis oral vancomycin for history of C. difficile.  Continue Senokot-S 2 tablets twice daily, MiraLAX twice daily to avoid constipation.   Right BKA with wound dehiscence Orthopedics was consulted and patient underwent right BKA revision by Dr. Sharol Given on 06/14/2022 with placement of a wound VAC.  Nonweightbearing right lower extremity with Prevena wound VAC in place.  Outpatient follow-up with orthopedics 1 week.   Perisplenic collection CT abdomen/pelvis with contrast noted 6 x 4 x 2 cm complex subcapsular collection superior aspect of the spleen with concern for hematoma versus infectious process.  Seen by general surgery, believe possible spontaneous subcapsular hematoma.  Has been off of anticoagulation for greater than 2 weeks.  Low suspicion for infectious collection given no gas.  Transfuse 1 unit PRBCs during hospitalization, hemoglobin stable 8.4 at time of discharge.  General surgery now signed off.  Recommend outpatient follow-up with general surgery with repeat CT Abdo/pelvis with IV contrast for interval surveillance.   Hx infected left AV fistula Patient underwent I&D by vascular surgery, Dr. Carlis Abbott on 05/05/2022. Vascular surgery consulted and sutures were  removed to left forearm on 1/17.  Outpatient follow-up with vascular surgery.   Chronic respiratory failure, O2 dependent CT chest without contrast 1/16 with small left pleural effusion, compressive atelectasis left lower lobe, old granulomatous disease.  Continue home supplemental oxygen.  Albuterol nebs as needed.   Orthostatic hypotension/autonomic dysfunction Hx essential hypertension Continue midodrine 5 mg p.o. 3 times daily; Midodrine 10 mg p.o. daily on Monday/Wednesday/Fridays with HD   DM2 Hemoglobin A1c 8.9 on 05/02/2022, not well-controlled.  Lantus increased to 40 units obviously daily.  NovoLog 5 units 3 times daily AC.  Continue Tradjenta.  Outpatient follow-up with PCP for further adjustment/guidance.  Home regimen includes Lantus 10 units nightly, NovoLog sliding scale, Tradjenta.   Anemia of chronic medical/renal disease Transfuse 1 unit he receives her hospitalization.  Hemoglobin 8.4 at time of discharge.   Hypothyroidism Continue levothyroxine 50 mcg p.o. daily   Paroxysmal atrial fibrillation Continue amiodarone 200 mg p.o. daily, not on anticoagulation due to rectal ulcer as above   Anxiety/depression Trintellix 20 mg p.o. every morning, Ativan 0.5 mg p.o. every 8 hours as needed anxiety  Discharge Diagnoses:  Principal Problem:   Diverticulitis Active Problems:   Stercoral colitis   C. difficile colitis   Spleen hematoma   Type 2 diabetes mellitus with diabetic polyneuropathy, with long-term current use of insulin (HCC)   ESRD on dialysis (HCC)   Paroxysmal atrial fibrillation (HCC)   Dehiscence of amputation stump of right lower extremity (HCC)   Anemia of chronic disease   Hypothyroidism   Prolonged QT interval   History of Clostridioides difficile colitis   Below-knee amputation of right lower extremity ([place])   Constipation    Discharge Instructions  Discharge Instructions     Ambulatory referral to General Surgery   Complete by: As directed     Call MD for:  difficulty breathing, headache or visual disturbances   Complete by: As directed    Call MD for:  extreme fatigue   Complete by: As directed    Call MD for:  persistant dizziness or light-headedness   Complete by: As directed    Call MD for:  persistant nausea and vomiting   Complete by: As directed    Call MD for:  severe uncontrolled pain   Complete by: As directed    Call MD for:  temperature >100.4   Complete by: As directed    Diet - low sodium heart healthy   Complete by: As directed    Increase activity slowly   Complete by: As directed    Negative Pressure Wound Therapy - Incisional   Complete by: As directed    Attached the wound VAC dressing to the Praveena plus portable wound VAC pump at discharge.   No wound care   Complete by: As directed    Continue wound VAC per orthopedics x 1 week      Allergies as of 06/17/2022       Reactions   Cephalexin Diarrhea, Other (See Comments)   Codeine Nausea And Vomiting, Other (See Comments)        Medication List     TAKE these medications    albuterol 108 (90 Base) MCG/ACT inhaler Commonly known as: VENTOLIN HFA Inhale 1-2 puffs into the lungs every 6 (six) hours as needed for wheezing or shortness of breath.   amiodarone 200 MG tablet Commonly known as: PACERONE Take 1 tablet (200 mg total) by mouth daily.   Anusol-HC 2.5 % rectal cream Generic drug: hydrocortisone  Place 1 Application rectally 2 (two) times daily. (1000 & 2100)   aspirin EC 81 MG tablet Take 1 tablet (81 mg total) by mouth daily with breakfast.   DIALYVITE TABLET Tabs Take 1 tablet by mouth in the morning. (1000)   diphenoxylate-atropine 2.5-0.025 MG tablet Commonly known as: Lomotil Take 1 tablet by mouth 4 (four) times daily as needed for diarrhea or loose stools.   feeding supplement (GLUCERNA 1.2 CAL) Liqd Take 237 mLs by mouth daily.   ferrous sulfate 325 (65 FE) MG tablet Take 325 mg by mouth 2 (two) times daily.  (4034 & 7425)   folic acid-vitamin b complex-vitamin c-selenium-zinc 3 MG Tabs tablet Take 1 tablet by mouth daily.   FreeStyle Libre 2 Sensor Misc   hydrALAZINE 25 MG tablet Commonly known as: APRESOLINE Take 1 tablet (25 mg total) by mouth every 6 (six) hours as needed (SBP>150 or DBP>100).   Lantus SoloStar 100 UNIT/ML Solostar Pen Generic drug: insulin glargine Inject 40 Units into the skin at bedtime. What changed: how much to take   levothyroxine 50 MCG tablet Commonly known as: SYNTHROID Take 1 tablet (50 mcg total) by mouth daily before breakfast.   linagliptin 5 MG Tabs tablet Commonly known as: TRADJENTA Take 1 tablet (5 mg total) by mouth daily.   LORazepam 0.5 MG tablet Commonly known as: ATIVAN Take 1 tablet (0.5 mg total) by mouth every 8 (eight) hours as needed for anxiety.   magnesium oxide 400 (240 Mg) MG tablet Commonly known as: MAG-OX Take 1 tablet (400 mg total) by mouth daily.   midodrine 5 MG tablet Commonly known as: PROAMATINE Take 1 tablet (5 mg total) by mouth 3 (three) times daily with meals.   midodrine 10 MG tablet Commonly known as: PROAMATINE Take 1 tablet (10 mg total) by mouth every Monday, Wednesday, and Friday with hemodialysis.   NovoLOG FlexPen 100 UNIT/ML FlexPen Generic drug: insulin aspart Inject 0-15 Units into the skin See admin instructions. Inject 5 units subcutaneously 3 times daily with meals (0900, 1300 & 1700) & as needed via sliding scale insulin CBG <70=Notify NP/MD 71-149=0 units,150-199=4 units, 200-249=6 units,250- 299=10 units, 300-349=12 units, 350-399=15 units, > 400 call MD ( prime pen with 2 units prior  to set dose)   ondansetron 4 MG tablet Commonly known as: ZOFRAN Take 4 mg by mouth every 8 (eight) hours as needed for nausea.   OneTouch Delica Plus [id number] Misc Use as directed to check blood sugars as need up to 4 times daily   OneTouch Verio Flex System w/Device Kit Use up to four times daily as  directed.   oxyCODONE-acetaminophen 5-325 MG tablet Commonly known as: PERCOCET/ROXICET Take 1 tablet by mouth every 4 (four) hours as needed.   OXYGEN Inhale 2 L/min into the lungs at bedtime.   pantoprazole 40 MG tablet Commonly known as: Protonix Take 1 tablet (40 mg total) by mouth daily.   Pentips 32G X 4 MM Misc Generic drug: Insulin Pen Needle Use as directed   polyethylene glycol 17 g packet Commonly known as: MIRALAX / GLYCOLAX Take 17 g by mouth 2 (two) times daily. What changed:  when to take this additional instructions   pregabalin 75 MG capsule Commonly known as: LYRICA Take 1 capsule (75 mg total) by mouth daily.   saccharomyces boulardii 250 MG capsule Commonly known as: FLORASTOR Take 500 mg by mouth 2 (two) times daily. X14 days   senna-docusate 8.6-50 MG tablet Commonly known as: Senokot-S Take  2 tablets by mouth 2 (two) times daily. What changed:  when to take this additional instructions   sevelamer carbonate 800 MG tablet Commonly known as: RENVELA Take 1,600 mg by mouth 3 (three) times daily with meals. (0800, 1200 & 1700)   Trintellix 20 MG Tabs tablet Generic drug: vortioxetine HBr Take 20 mg by mouth in the morning. (1000)        Follow-up Information     Newt Minion, MD Follow up in 1 week(s).   Specialty: Orthopedic Surgery Contact information: [place] [address] [phone]         Marty Heck, MD Follow up in 2 week(s).   Specialty: Vascular Surgery Why: Office will call you to arrange your appt (sent) Contact information: [place] [address] [phone]         [name], MD. Schedule an appointment as soon as possible for a visit in 1 week(s).   Specialty: Family Medicine Contact information: [address] [place] [address] [phone]         [facility], [place]. Schedule an appointment as soon as possible for a visit.    Specialty: General Surgery Why: Follow-up for perisplenic fluid collection Contact information: [place] [address] [phone]               Allergies  Allergen Reactions   Cephalexin Diarrhea and Other (See Comments)   Codeine Nausea And Vomiting and Other (See Comments)    Consultations: General surgery Infection disease Orthopedics, Dr. Sharol Given Nephrology Vascular surgery   Procedures/Studies: VAS [name] (AVF, AVG)  Result Date: 06/14/2022 DIALYSIS ACCESS Patient Name:  REBEL LAUGHRIDGE  Date of Exam:   06/13/2022 Medical Rec #: [id number]     Accession #:    [id number] Date of Birth: 1950-10-28     Patient Gender: F Patient Age:   72 years Exam Location:  [facility] Procedure:      VAS [name] (AVF, AVG) Referring Phys: EMMA COLLINS --------------------------------------------------------------------------------  Reason for Exam: Routine follow up. Access Site: Left Upper Extremity. Access Type: First stage basilic vein transposition. History: 04-02-2022 Left AVF creation          05-05-2022 Debridement of left AVF. Comparison Study: No prior studies. Performing Technologist: Darlin Coco RDMS, RVT  Examination Guidelines: A complete evaluation includes B-mode imaging, spectral Doppler, color Doppler, and power Doppler as needed of all accessible portions of each vessel. Unilateral testing is considered an integral part of a complete examination. Limited examinations for reoccurring indications may be performed as noted.  Findings: +--------------------+----------+-----------------+--------+ AVF                 PSV (cm/s)Flow Vol (mL/min)Comments +--------------------+----------+-----------------+--------+ Native artery inflow   171           415                +--------------------+----------+-----------------+--------+ AVF Anastomosis        252                               +--------------------+----------+-----------------+--------+  +--------------------+----------+-------------+----------+---------------------+ OUTFLOW VEIN        PSV (cm/s)Diameter (cm)Depth (cm)      Describe        +--------------------+----------+-------------+----------+---------------------+ Confluence at Axilla    81        0.61  1.13                         +--------------------+----------+-------------+----------+---------------------+ Prox UA                 53        0.68        1.60                         +--------------------+----------+-------------+----------+---------------------+ Mid UA                 126        0.74        1.66    Retained valve and                                                          competing branch    +--------------------+----------+-------------+----------+---------------------+ Dist UA                649        0.40        1.12                         +--------------------+----------+-------------+----------+---------------------+   Summary: Patent left first stage basilic transposition with retained valves and competing branch at the mid upper arm.  *See table(s) above for measurements and observations.  Diagnosing physician: Harold Barban MD Electronically signed by Harold Barban MD on 06/14/2022 at 10:18:21 AM.   --------------------------------------------------------------------------------   Final    DG CHEST PORT 1 VIEW  Result Date: 06/14/2022 CLINICAL DATA:  Chest pain EXAM: PORTABLE CHEST 1 VIEW COMPARISON:  06/11/2022 FINDINGS: Mild left basilar atelectasis. Lungs are otherwise clear. No pneumothorax or pleural effusion. Right internal jugular hemodialysis catheter tip noted at the superior cavoatrial junction. Cardiac size within normal limits. Pulmonary vascularity is normal. No acute bone abnormality. IMPRESSION: 1. Mild left basilar atelectasis. Electronically Signed   By: Fidela Salisbury M.D.   On: 06/14/2022  03:29   DG Abd Portable 1V  Result Date: 06/12/2022 CLINICAL DATA:  Encounter for constipation. EXAM: PORTABLE ABDOMEN - 1 VIEW COMPARISON:  Radiographs 06/10/2022 and 08/15/2020.  CT 06/05/2022. FINDINGS: 0438 hours. Two supine views of the abdomen are submitted. The bowel gas pattern appears normal. Colonic stool burden does not appear excessive. No supine evidence of bowel wall thickening or pneumoperitoneum. There are scattered vascular calcifications without suspicious abdominal calcifications. Mild spondylosis noted. IMPRESSION: No acute findings or radiographic evidence of significant constipation. Electronically Signed   By: Richardean Sale M.D.   On: 06/12/2022 08:16   CT CHEST WO CONTRAST  Result Date: 06/12/2022 CLINICAL DATA:  Respiratory illness, nondiagnostic x-ray. EXAM: CT CHEST WITHOUT CONTRAST TECHNIQUE: Multidetector CT imaging of the chest was performed following the standard protocol without IV contrast. RADIATION DOSE REDUCTION: This exam was performed according to the departmental dose-optimization program which includes automated exposure control, adjustment of the mA and/or kV according to patient size and/or use of iterative reconstruction technique. COMPARISON:  Chest radiographs 06/11/2022 and 06/07/2022. CT chest 06/10/2022 and abdominal CTA 05/23/2022. FINDINGS: Cardiovascular: Right IJ central venous catheter extends to the superior cavoatrial junction. Diffuse atherosclerosis of the aorta, great vessels and coronary arteries. The heart size is normal. There  is no pericardial effusion. Mediastinum/Nodes: Again demonstrated are calcified subcarinal and right hilar lymph nodes and a mildly enlarged precarinal node measuring 1.5 cm short axis on image 55/3. No axillary adenopathy. The thyroid gland, trachea and esophagus demonstrate no significant findings. Lungs/Pleura: No significant change in small left pleural effusion with adjacent compressive left lower lobe atelectasis.  There is mildly increased subsegmental atelectasis at the right lung base. Underlying central airway thickening is present. There are stable scattered small calcified granulomas. No confluent airspace opacity or suspicious pulmonary nodule. Upper abdomen: Scattered calcified granulomas in the liver and spleen. Hypodense lesion involving the medial aspect of the spleen has enlarged from recent abdominal CTA and is now so seated with a probable complex fluid collection extending along the anteromedial aspect of the spleen, measuring up to 6.6 x 5.9 cm on image 123/3. There is no air within this collection or surrounding inflammatory change. Musculoskeletal/Chest wall: There is no chest wall mass or suspicious osseous finding. Level spondylosis. IMPRESSION: 1. No significant change in small left pleural effusion with adjacent compressive left lower lobe atelectasis. Mildly increased right basilar subsegmental atelectasis. 2. Enlarging hypodense lesion involving the medial aspect of the spleen with a probable complex fluid collection extending along the anteromedial aspect of the spleen. This could reflect a splenic abscess or subacute infarct with adjacent hematoma. No air within this collection or surrounding inflammatory changes. Consider further evaluation with ultrasound or follow-up abdominal CT with contrast. 3. Sequela of prior granulomatous disease with unchanged, nonspecific mildly enlarged precarinal lymph node. 4.  Aortic Atherosclerosis (ICD10-I70.0). Electronically Signed   By: Richardean Sale M.D.   On: 06/12/2022 08:13   DG CHEST PORT 1 VIEW  Result Date: 06/11/2022 CLINICAL DATA:  Shortness of breath.  [address] EXAM: PORTABLE CHEST 1 VIEW COMPARISON:  06/07/2022 FINDINGS: Right central venous catheter with tip over the low SVC region. No pneumothorax. Borderline heart size with normal pulmonary vascularity. Lungs are clear. No pleural effusions. No pneumothorax. Mediastinal contours appear intact.  Calcification of the aorta. Degenerative changes in the spine and shoulders. No change since prior study. IMPRESSION: No active disease. Electronically Signed   By: Lucienne Capers M.D.   On: 06/11/2022 23:03   CT CHEST WO CONTRAST  Result Date: 06/10/2022 CLINICAL DATA:  Chronic dyspnea, chest wall or pleural disease suspected EXAM: CT CHEST WITHOUT CONTRAST TECHNIQUE: Multidetector CT imaging of the chest was performed following the standard protocol without IV contrast. RADIATION DOSE REDUCTION: This exam was performed according to the departmental dose-optimization program which includes automated exposure control, adjustment of the mA and/or kV according to patient size and/or use of iterative reconstruction technique. COMPARISON:  12/10/2005 FINDINGS: Cardiovascular: RIGHT jugular line with tip at cavoatrial junction. Extensive atherosclerotic calcifications aorta, proximal great vessels and coronary arteries. Aorta normal caliber. Mild enlargement of cardiac chambers. No pericardial effusion. Mediastinum/Nodes: Esophagus unremarkable. Elongated RIGHT thyroid lobe. Enlarged precarinal lymph node 14 mm image 54. Calcified subcarinal and RIGHT hilar nodes. Lungs/Pleura: Small LEFT pleural effusion. Compressive atelectasis LEFT lower lobe. Remaining lungs clear. No pulmonary infiltrate or pneumothorax. Calcified granuloma anteromedial RIGHT upper lobe. Upper Abdomen: Extensive atherosclerotic calcifications throughout celiac axis. Remaining visualized upper abdomen unremarkable. Musculoskeletal: No acute osseous findings. IMPRESSION: Small LEFT pleural effusion with compressive atelectasis of LEFT lower lobe. Old granulomatous disease. Extensive atherosclerotic calcifications including coronary arteries. Single nonspecific mildly enlarged precarinal lymph node 14 mm diameter. Aortic Atherosclerosis (ICD10-I70.0). Electronically Signed   By: Lavonia Dana M.D.   On: 06/10/2022 13:49  DG Abd Portable  1V  Result Date: 06/10/2022 CLINICAL DATA:  Constipation EXAM: PORTABLE ABDOMEN - 1 VIEW COMPARISON:  X-ray 06/09/2022.  CT 06/05/2022 and older FINDINGS: Underpenetrated portable x-ray. Gas is seen in nondilated loops of small large bowel. Scattered mild colonic stool but less overall than previous. No frank obstruction. No obvious free air on this portable supine radiograph. Overlapping cardiac leads. IMPRESSION: Improving colonic stool.  Nonspecific bowel gas pattern Electronically Signed   By: Jill Side M.D.   On: 06/10/2022 12:25   DG Abd 1 View  Result Date: 06/09/2022 CLINICAL DATA:  Constipation.  Diverticulitis. EXAM: ABDOMEN - 1 VIEW COMPARISON:  08/15/2020 bowel FINDINGS: Breast pattern is nonobstructive. There is no free intraperitoneal air. No evidence for organomegaly. Moderate stool burden. Degenerative changes are seen in the spine. IMPRESSION: Nonobstructive bowel gas pattern. Moderate stool burden. Electronically Signed   By: Nolon Nations M.D.   On: 06/09/2022 07:21   DG CHEST PORT 1 VIEW  Result Date: 06/07/2022 CLINICAL DATA:  Shortness of breath EXAM: PORTABLE CHEST 1 VIEW COMPARISON:  Chest x-ray May 26, 2022 FINDINGS: Cardiomediastinal silhouette is stable. No pneumothorax. No nodules or masses. No focal infiltrates. Stable right central line terminating in the SVC. No overt edema. IMPRESSION: No active disease. Electronically Signed   By: Dorise Bullion III M.D.   On: 06/07/2022 10:49   CT ABDOMEN PELVIS WO CONTRAST  Result Date: 06/05/2022 CLINICAL DATA:  Abdominal pain. EXAM: CT ABDOMEN AND PELVIS WITHOUT CONTRAST TECHNIQUE: Multidetector CT imaging of the abdomen and pelvis was performed following the standard protocol without IV contrast. RADIATION DOSE REDUCTION: This exam was performed according to the departmental dose-optimization program which includes automated exposure control, adjustment of the mA and/or kV according to patient size and/or use of iterative  reconstruction technique. COMPARISON:  CTA abdomen and pelvis 05/23/2022, CT abdomen and pelvis with oral contrast only 03/27/2022 FINDINGS: Lower chest: There is interval new demonstration of a small layering left pleural effusion. There is overlying atelectasis. There is mild posterior atelectasis in the right lower lobe with a 2 cm pneumatocele again seen. Lung bases are otherwise unremarkable. The cardiac size is normal. There are calcifications in the coronary arteries. Hepatobiliary: There are stones in the gallbladder without wall thickening or bile duct dilatation. There are calcified granulomas in the liver. Capsular nodularity in the left lobe consistent with cirrhosis. No focal abnormality is seen without contrast. Pancreas: Atrophic and otherwise unremarkable without contrast. Spleen: There are multiple calcified granulomas. The spleen is slightly enlarged. There is a new subcapsular complex hypodense collection in the superior aspect of the spleen measuring 6 x 4.2 x 2 cm. Major differential concerns include a contained subcapsular hematoma and an infectious process. Hounsfield density is difficult to accurately characterize due to streak artifact from the patient's overlying arms, but measures between - 10 and -28 Hounsfield units if accurate. If this is a hematoma there is no perisplenic hemorrhagic products. If this is an abscess, there is no air in it. Adrenals/Urinary Tract: Slight chronic nodular thickening of the adrenal glands. No new adrenal abnormality. Extensive renovascular calcifications at both renal hila are again shown with bilateral renal cortical thinning. Small renal cysts are similar. No new renal abnormality. No stones or urinary obstruction. Unremarkable bladder. Stomach/Bowel: The stomach contracted and not well seen but no obvious wall thickening suspected. Small-bowel there are increased thickened folds in the left abdominal small bowel but there is no acute inflammatory change  or obstruction. The appendix  is normal caliber. There is sigmoid diverticulosis with increased wall thickening and inflammatory stranding involving the mid to distal thirds of the sigmoid colon consistent with acute diverticulitis. There is a large stool ball in the rectum not seen previously and scattered rectal wall pneumatosis. Some of the rectal stool is in the process of being expelled. Vascular/Lymphatic: There are extensive visceral arterial calcifications and moderate aortoiliac calcific plaques. This was described in detail in the recent CTA abdomen and pelvis report. There are mild mesenteric congestive features which have increased in the interval. No abdominal, pelvic or inguinal adenopathy is seen. Reproductive: Status post hysterectomy. No adnexal masses. Other: In addition to increased mesenteric congestion there is increased body wall anasarca. Minimal posterior pelvic ascites is unchanged. There is no free air, free hemorrhage or abscess. No incarcerated hernia. Musculoskeletal: There is osteopenia and degenerative change of the spine. No aggressive bone lesion is seen. IMPRESSION: 1. New 6 x 4.2 x 2 cm complex subcapsular collection in the superior aspect of the spleen. Major differential concerns include a contained subcapsular hematoma and an infectious process. If this is a hematoma there are no perisplenic hemorrhagic products. If this is an abscess, there is no air in it. 2. New small left pleural effusion with overlying atelectasis. 3. Increased mesenteric congestion and body wall anasarca. 4. Increased thickened folds in the left abdominal small bowel without acute inflammatory change. This could be due to mesenteric congestion or enteritis. 5. Large stool ball in the rectum with scattered rectal wall pneumatosis, the latter which could indicate ischemic changes or be due to stercoral proctitis. 6. Sigmoid diverticulosis with increased wall thickening and inflammatory stranding in the mid to  distal sigmoid consistent with acute diverticulitis. No free air or free hemorrhage. 7. Cholelithiasis. 8. Aortic and coronary artery atherosclerosis. 9. Capsular nodularity in the left lobe of liver consistent with cirrhosis. Aortic Atherosclerosis (ICD10-I70.0). Electronically Signed   By: Telford Nab M.D.   On: 06/05/2022 04:21   DG CHEST PORT 1 VIEW  Result Date: 05/26/2022 CLINICAL DATA:  Shortness of breath EXAM: PORTABLE CHEST 1 VIEW COMPARISON:  May 23, 2022 FINDINGS: The left and right central lines terminate in the SVC. No pneumothorax. No pulmonary nodules, infiltrates, or overt edema. The cardiomediastinal silhouette is stable. No other acute abnormalities are identified. IMPRESSION: 1. Support apparatus as above. 2. No acute abnormalities. Electronically Signed   By: Dorise Bullion III M.D.   On: 05/26/2022 11:05   DG Chest Port 1 View  Result Date: 05/23/2022 CLINICAL DATA:  Caliber central line placement EXAM: PORTABLE CHEST 1 VIEW COMPARISON:  04/02/2022 FINDINGS: Interval placement of a LEFT central venous line with tip in the distal SVC at the cavoatrial junction. No pneumothorax. Large bore RIGHT central venous catheter unchanged. Low lung volumes. IMPRESSION: Central venous line with tip at the cavoatrial junction. No pneumothorax Electronically Signed   By: Suzy Bouchard M.D.   On: 05/23/2022 15:01   CT Angio Abd/Pel W and/or Wo Contrast  Result Date: 05/23/2022 CLINICAL DATA:  Ongoing lower GI bleed. EXAM: CTA ABDOMEN AND PELVIS WITHOUT AND WITH CONTRAST TECHNIQUE: Multidetector CT imaging of the abdomen and pelvis was performed using the standard protocol during bolus administration of intravenous contrast. Multiplanar reconstructed images and MIPs were obtained and reviewed to evaluate the vascular anatomy. RADIATION DOSE REDUCTION: This exam was performed according to the departmental dose-optimization program which includes automated exposure control, adjustment of  the mA and/or kV according to patient size and/or use of  iterative reconstruction technique. CONTRAST:  1110m OMNIPAQUE IOHEXOL 350 MG/ML SOLN COMPARISON:  CTA abdomen and pelvis earlier today at 12:53 a.m., CT abdomen pelvis without contrast 03/27/2022 and 08/22/2020. FINDINGS: VASCULAR Aorta: Moderate calcific plaque. No aneurysm, dissection or stenosis. Celiac: Moderate calcification noted without flow-limiting celiac stenosis, with circumferential calcifications in the branch arteries and extensive small branch arterial calcifications within the spleen and central liver. SMA: There is moderate calcification proximal to the inflection point without stenosis, circumferential calcification distal to the inflection point. No major-vessel stenosis. Renals: There are calcifications moderately in both proximal renal arteries but no hemodynamically significant (greater than 50%) stenosis. Calcifications continue into the hila and parenchyma along the branch arteries. IMA: Circumferentially calcified with severe origin stenosis. Flow is otherwise seen as previously. Inflow: No interval change from earlier today. Calcifications are greatest in the common iliac arteries and internal iliac arteries, but no stenosis greater than 50% is seen. There is only mild calcification in the external iliac arteries no stenosis. Proximal Outflow: The proximal outflow vessels demonstrate bilateral calcifications without flow-limiting stenoses. Veins: Patent. Review of the MIP images confirms the above findings. NON-VASCULAR Lower chest: No acute abnormality. Hepatobiliary: Scattered calcified granulomas. Capsular nodularity suggesting at least early cirrhosis with mild hepatic steatosis. No mass enhancement. There is cholelithiasis without bile duct dilatation or cholecystitis. Pancreas: Diffusely atrophic.  No other focal abnormality. Spleen: Mildly enlarged with scattered calcified granulomas, 15 cm AP. No focal abnormality.  Adrenals/Urinary Tract: No new abnormality. Extensive renovascular calcifications. Stomach/Bowel: Wall thickening of the distal sigmoid colon and rectum is again noted with adjacent stranding. In contrast to the earlier study today, there is now an irregular contrast blush into the lumen from the left dorsal aspect of the distal rectal wall on arterial phase series 8 axial images 192-201, with further diffusion of leaked contrast into the rectal content on venous phase series 10 axial images 133-191. The contrast blush in the arterial phase is somewhat serpiginous with a least 1 and possibly 2 sites of active leakage. Upstream diverticulosis and constipation are redemonstrated. Lymphatic: No adenopathy. Reproductive: Status post hysterectomy. No adnexal masses. Other: Mild body wall edema. Minimal deep pelvic ascites is similar. Musculoskeletal: Osteopenia and degenerative change. IMPRESSION: 1. There is an irregular, somewhat serpiginous contrast blush into the distal rectum from the left dorsal wall on the arterial phase with further diffusion of the leaked contrast into the rectal content on the venous phase. Findings are consistent with active GI bleeding. 2. Extensive vascular calcifications, especially of small intraorgan branch arteries. 3. Wall thickening of the distal sigmoid colon and rectum with adjacent stranding. Findings consistent with proctocolitis. 4. Upstream diverticulosis and constipation. 5. Cirrhotic liver morphology with mild splenomegaly. The main portal vein is normal caliber. 6. Cholelithiasis. 7. Mild body wall edema and minimal pelvic ascites. 8. Osteopenia and degenerative change. 9. Aortic atherosclerosis. 10. Additional redemonstrated findings described above as well as earlier today. 11. These results will be called to the ordering clinician or representative by the radiologist assistant, and communication documented in the PACS or CDixondashboard. Aortic Atherosclerosis  (ICD10-I70.0). Electronically Signed   By: KTelford NabM.D.   On: 05/23/2022 06:28   CT Angio Abd/Pel W and/or Wo Contrast  Result Date: 05/23/2022 CLINICAL DATA:  Lower GI bleed.  Bloody diarrhea x4 days. EXAM: CTA ABDOMEN AND PELVIS WITHOUT AND WITH CONTRAST TECHNIQUE: Multidetector CT imaging of the abdomen and pelvis was performed using the standard protocol during bolus administration of intravenous contrast. Multiplanar reconstructed images  and MIPs were obtained and reviewed to evaluate the vascular anatomy. RADIATION DOSE REDUCTION: This exam was performed according to the departmental dose-optimization program which includes automated exposure control, adjustment of the mA and/or kV according to patient size and/or use of iterative reconstruction technique. CONTRAST:  156m OMNIPAQUE IOHEXOL 350 MG/ML SOLN COMPARISON:  CT of abdomen and pelvis studies, both without contrast, dated 1Nov 09, 2023and 08/22/2020. There is also an MRCP from 02/01/2022. FINDINGS: VASCULAR Aorta: There are moderate patchy calcific plaques without aneurysm, flow-limiting stenosis or dissection. Celiac: There are patchy calcific plaques of the celiac artery and its major branch arteries. Ostial calcific plaques cause 40% origin stenosis but there is no other focal stenosis, no dissection or aneurysm. There are extensive calcifications in the hepatic and splenic parenchymal small arteries. SMA: There are moderate calcific plaques, including circumferential calcifications distal to the vessel inflection point, but no flow-limiting major-vessel stenosis. Renals: There are calcific plaques, which continue to the renal hila but no flow-limiting or greater than 50% major vessel stenosis is seen. Both renal arteries are single. IMA: There are circumferential vessel calcifications. There is high-grade vessel origin stenosis but no other visible focal stenosis. Inflow: There are moderate calcifications in the common iliac arteries but  no hemodynamically significant or greater than 50% stenosis is seen. There are circumferential calcifications in the internal iliac arteries but no flow-limiting stenosis. There is mild scattered calcification in the external iliac arteries without significant stenosis. Proximal Outflow: There are calcifications in the common femoral and proximal superficial femoral arteries and of the visualized deep femoral arteries, without flow-limiting stenosis. Veins: Patent.  The main portal vein is normal in caliber. Review of the MIP images confirms the above findings. NON-VASCULAR Lower chest: There is mild cardiomegaly. There is calcification in the right coronary artery. The intraventricular blood pool is low in density on the noncontrast scan consistent with anemia. Lung bases are clear. Right pleural effusion noted previously has cleared. Hepatobiliary: The liver is 18 cm length mildly steatotic with scattered calcified granulomas. There is no mass enhancement. Some images suggest capsular lobulation which could be due to early cirrhosis. There are stones in the gallbladder but no wall thickening or biliary prominence. Pancreas: Moderately atrophic without inflammatory changes, mass enhancement or ductal dilatation. Spleen: Mildly enlarged, with multiple calcified granulomas, unchanged in size at 15 cm AP. No mass is seen. Adrenals/Urinary Tract: There is no adrenal mass. There is mild bilateral renal volume loss likely vascular etiology. There is no mass enhancement. There are small right parapelvic cysts which are unchanged with no follow-up required. Extensive renovascular calcifications extend to the hila. There is no urinary stone or obstruction. There has no bladder thickening. There previously was air in the bladder which is not seen today. Stomach/Bowel: There is increased antral gastric fold thickening without inflammatory changes or penetrating ulcer. The unopacified small bowel is unremarkable and there is a  normal appendix. There is moderate stool retention in the ascending and transverse colon. There are colonic diverticula most advanced in the sigmoid segment. There is wall thickening and stranding along the distal sigmoid colon and rectum consistent with proctocolitis, but no arterial or venous contrast leakage is seen in the lumen. Lymphatic: No adenopathy is seen. Reproductive: Uterus and bilateral adnexa are unremarkable. Other: There is mild body wall anasarca but less pronounced than on 1November 09, 2023 There is minimal ascites in the posterior deep pelvis but the abdominal ascites noted previously has cleared. There is no incarcerated hernia. No bowel pneumatosis is seen  and no free air or free hemorrhage. Musculoskeletal: Advanced marginal osteophytosis thoracic and lumbar spine. 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 Mild osteopenia. IMPRESSION: 1. No evidence of arterial or venous contrast leakage into the bowel lumen. 2. Extensive vascular calcifications, particularly the small intraorgan parenchymal branch arteries including renovascular. 3. 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 4. Major branch arteries otherwise without flow-limiting (or greater than 50%) stenoses. 5. Cardiomegaly with calcific CAD. 6. Anemia. 7. Proctocolitis involving the distal sigmoid colon and rectum, likely infectious or inflammatory. No pneumatosis or portal venous gas. Ischemic etiology not strictly excluded. 8. Diverticulosis without evidence of diverticulitis.  Constipation. 9. Mild hepatic steatosis with possible early cirrhosis. Mild splenomegaly. 10. Cholelithiasis. 11. Mild body wall anasarca but less pronounced than on 04-02-2022. 12. Minimal ascites in the posterior deep pelvis. 13. Osteopenia and degenerative change. 14. Increased antral gastric fold thickening which could be due to nondistention or gastritis. 15. Aortic atherosclerosis. Aortic  Atherosclerosis (ICD10-I70.0). Electronically Signed   By: Telford Nab M.D.   On: 05/23/2022 01:47     Subjective: Patient seen examined bedside, resting comfortably.  Lying in bed.  No specific complaints this morning.  Discharging to SNF.  Will need close outpatient follow-up with vascular surgery, general surgery and orthopedics.  Denies headache, no dizziness, no chest pain, no palpitations, no shortness of breath more than a typical baseline, no fever/chills/night sweats, no nausea/vomiting/diarrhea, no focal weakness, no fatigue, no paresthesias.  No acute events overnight per nursing staff.  Discharge Exam: Vitals:   06/16/22 2044 06/17/22 0510  BP: (!) 115/49 (!) 130/52  Pulse: 90 91  Resp: 18 18  Temp: 98.5 F (36.9 C) 98.2 F (36.8 C)  SpO2: 99% 99%   Vitals:   06/16/22 1949 06/16/22 2000 06/16/22 2044 06/17/22 0510  BP: 132/83 (!) 123/58 (!) 115/49 (!) 130/52  Pulse: 95 93 90 91  Resp: '15 15 18 18  '$ Temp:  98.4 F (36.9 C) 98.5 F (36.9 C) 98.2 F (36.8 C)  TempSrc:  Oral Oral Oral  SpO2:  (!) 3% 99% 99%  Weight:  100 kg    Height:        Physical Exam: GEN: NAD, alert and oriented x 3, chronically ill appearance, appears older than stated age HEENT: NCAT, PERRL, EOMI, sclera clear, MMM PULM: CTAB w/o wheezes/crackles, normal respiratory effort, on 3 L nasal cannula with SpO2 99% at rest CV: RRR w/o M/G/R GI: abd soft, NTND, NABS, no R/G/M MSK: no peripheral edema, 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     Microbiology: Recent Results (from  the past 240 hour(s))  Resp panel by RT-PCR (RSV, Flu A&B, Covid) Anterior Nasal Swab     Status: None   Collection Time: 06/11/22 11:19 PM   Specimen: Anterior Nasal Swab  Result Value Ref Range Status   SARS Coronavirus 2 by RT PCR NEGATIVE NEGATIVE Final    Comment: (NOTE) SARS-CoV-2 target nucleic acids are NOT DETECTED.  The SARS-CoV-2 RNA is generally detectable in upper respiratory specimens during the acute phase of infection. The lowest concentration of SARS-CoV-2 viral copies  this assay can detect is 138 copies/mL. A negative result does not preclude SARS-Cov-2 infection and should not be used as the sole basis for treatment or other patient management decisions. A negative result may occur with  improper specimen collection/handling, submission of specimen other than nasopharyngeal swab, presence of viral mutation(s) within the areas targeted by this assay, and inadequate number of viral copies(<138 copies/mL). A negative result must be combined with clinical observations, patient history, and epidemiological information. The expected result is Negative.  Fact Sheet for Patients:  EntrepreneurPulse.com.au  Fact Sheet for Healthcare Providers:  IncredibleEmployment.be  This test is no t yet approved or cleared by the [place] FDA and  has been authorized for detection and/or diagnosis of SARS-CoV-2 by FDA under an Emergency Use Authorization (EUA). This EUA will remain  in effect (meaning this test can be used) for the duration of the COVID-19 declaration under Section 564(b)(1) of the Act, 21 U.S.C.section 360bbb-3(b)(1), unless the authorization is terminated  or revoked sooner.       Influenza A by PCR NEGATIVE NEGATIVE Final   Influenza B by PCR NEGATIVE NEGATIVE Final    Comment: (NOTE) The Xpert Xpress SARS-CoV-2/FLU/RSV plus assay is intended as an aid in the diagnosis of influenza from Nasopharyngeal swab specimens  and should not be used as a sole basis for treatment. Nasal washings and aspirates are unacceptable for Xpert Xpress SARS-CoV-2/FLU/RSV testing.  Fact Sheet for Patients: EntrepreneurPulse.com.au  Fact Sheet for Healthcare Providers: IncredibleEmployment.be  This test is not yet approved or cleared by the [place] FDA and has been authorized for detection and/or diagnosis of SARS-CoV-2 by FDA under an Emergency Use Authorization (EUA). This EUA will remain in effect (meaning this test can be used) for the duration of the COVID-19 declaration under Section 564(b)(1) of the Act, 21 U.S.C. section 360bbb-3(b)(1), unless the authorization is terminated or revoked.     Resp Syncytial Virus by PCR NEGATIVE NEGATIVE Final    Comment: (NOTE) Fact Sheet for Patients: EntrepreneurPulse.com.au  Fact Sheet for Healthcare Providers: IncredibleEmployment.be  This test is not yet approved or cleared by the [place] FDA and has been authorized for detection and/or diagnosis of SARS-CoV-2 by FDA under an Emergency Use Authorization (EUA). This EUA will remain in effect (meaning this test can be used) for the duration of the COVID-19 declaration under Section 564(b)(1) of the Act, 21 U.S.C. section 360bbb-3(b)(1), unless the authorization is terminated or revoked.  Performed at [facility], [name] [address] [place], [place] [address]      Labs: BNP (last 3 results) Recent Labs    01/29/22 1147 02/01/22 0615 02/25/22 1051  BNP 891.1* 451.5* 3,329.5*   Basic Metabolic Panel: Recent Labs  Lab 06/12/22 1152 06/13/22 0708 06/14/22 0528 06/16/22 0343  NA 129* 134* 131* 128*  K 4.5 4.1 5.0 4.3  CL 95* 98 97* 96*  CO2 '26 29 23 26  '$ GLUCOSE 329* 106* 409* 137*  BUN 31* 27* 37* 64*  CREATININE 2.73* 2.15* 2.56* 2.93*  CALCIUM 8.5* 8.8* 8.9 8.6*  PHOS 3.1 3.5 4.9* 4.9*   Liver Function  Tests: Recent Labs  Lab 06/12/22 1152 06/13/22 0708 06/14/22 0528 06/16/22 0343  ALBUMIN 2.0* 2.1* 2.2* 2.0*   No results for input(s): "LIPASE", "AMYLASE" in the last 168 hours. No results for input(s): "AMMONIA" in the last 168 hours. CBC: Recent Labs  Lab 06/13/22 0708 06/14/22 0528 06/16/22 0343 06/16/22 2134 06/17/22 0658  WBC 5.7 8.6 8.0 10.6* 9.6  HGB 8.2* 8.1* 6.9* 8.4* 7.8*  HCT 27.7* 26.5* 23.1* 27.5* 25.1*  MCV 88.8 86.9 88.2 85.7 84.8  PLT 196 221 237 226 233   Cardiac Enzymes: No results for input(s): "CKTOTAL", "CKMB", "CKMBINDEX", "TROPONINI" in the last 168 hours. BNP: Invalid input(s): "POCBNP" CBG: Recent Labs  Lab 06/15/22 2123 06/16/22 0725 06/16/22 1123 06/16/22 2044 06/17/22 0730  GLUCAP 164* 126* 107* 133* 180*   D-Dimer No results for input(s): "DDIMER" in the last 72 hours. Hgb A1c No results for input(s): "HGBA1C" in the last 72 hours. Lipid Profile No results for input(s): "CHOL", "HDL", "LDLCALC", "TRIG", "CHOLHDL", "LDLDIRECT" in the last 72 hours. Thyroid function studies No results for input(s): "TSH", "T4TOTAL", "T3FREE", "THYROIDAB" in the last 72 hours.  Invalid input(s): "FREET3" Anemia work up No results for input(s): "VITAMINB12", "FOLATE", "FERRITIN", "TIBC", "IRON", "RETICCTPCT" in the last 72 hours. Urinalysis    Component Value Date/Time   COLORURINE YELLOW 02/05/2022 1520   APPEARANCEUR CLOUDY (A) 02/05/2022 1520   LABSPEC 1.010 02/05/2022 1520   PHURINE 5.0 02/05/2022 1520   GLUCOSEU >=500 (A) 02/05/2022 1520   HGBUR SMALL (A) 02/05/2022 1520   BILIRUBINUR NEGATIVE 02/05/2022 1520   BILIRUBINUR negative 06/05/2020 1334   KETONESUR NEGATIVE 02/05/2022 1520   PROTEINUR 30 (A) 02/05/2022 1520   UROBILINOGEN 0.2 06/05/2020 1334   UROBILINOGEN 0.2 04/01/2014 1659   NITRITE NEGATIVE 02/05/2022 1520   LEUKOCYTESUR LARGE (A) 02/05/2022 1520   Sepsis Labs Recent Labs  Lab 06/14/22 0528 06/16/22 0343  06/16/22 2134 06/17/22 0658  WBC 8.6 8.0 10.6* 9.6   Microbiology Recent Results (from the past 240 hour(s))  Resp panel by RT-PCR (RSV, Flu A&B, Covid) Anterior Nasal Swab     Status: None   Collection Time: 06/11/22 11:19 PM   Specimen: Anterior Nasal Swab  Result Value Ref Range Status   SARS Coronavirus 2 by RT PCR NEGATIVE NEGATIVE Final    Comment: (NOTE) SARS-CoV-2 target nucleic acids are NOT DETECTED.  The SARS-CoV-2 RNA is generally detectable in upper respiratory specimens during the acute phase of infection. The lowest concentration of SARS-CoV-2 viral copies this assay can detect is 138 copies/mL. A negative result does not preclude SARS-Cov-2 infection and should not be used as the sole basis for treatment or other patient management decisions. A negative result may occur with  improper specimen collection/handling, submission of specimen other than nasopharyngeal swab, presence of viral mutation(s) within the areas targeted by this assay, and inadequate number of viral copies(<138 copies/mL). A negative result must be combined with clinical observations, patient history, and epidemiological information. The expected result is Negative.  Fact Sheet for Patients:  EntrepreneurPulse.com.au  Fact Sheet for Healthcare Providers:  IncredibleEmployment.be  This test is no t yet approved or cleared by the [place] FDA and  has been authorized for detection and/or diagnosis of SARS-CoV-2 by FDA under an Emergency Use Authorization (EUA). This EUA will remain  in effect (meaning this test can be used) for the duration of the COVID-19 declaration under Section 564(b)(1) of the Act, 21 U.S.C.section 360bbb-3(b)(1), unless the authorization is terminated  or revoked sooner.       Influenza A by PCR NEGATIVE NEGATIVE Final   Influenza B by PCR NEGATIVE NEGATIVE Final    Comment: (NOTE) The Xpert Xpress SARS-CoV-2/FLU/RSV plus  assay is intended as an aid in the diagnosis of influenza from Nasopharyngeal swab specimens and should not be used as a sole basis for treatment. Nasal washings and aspirates are unacceptable  for Xpert Xpress SARS-CoV-2/FLU/RSV testing.  Fact Sheet for Patients: EntrepreneurPulse.com.au  Fact Sheet for Healthcare Providers: IncredibleEmployment.be  This test is not yet approved or cleared by the [place] FDA and has been authorized for detection and/or diagnosis of SARS-CoV-2 by FDA under an Emergency Use Authorization (EUA). This EUA will remain in effect (meaning this test can be used) for the duration of the COVID-19 declaration under Section 564(b)(1) of the Act, 21 U.S.C. section 360bbb-3(b)(1), unless the authorization is terminated or revoked.     Resp Syncytial Virus by PCR NEGATIVE NEGATIVE Final    Comment: (NOTE) Fact Sheet for Patients: EntrepreneurPulse.com.au  Fact Sheet for Healthcare Providers: IncredibleEmployment.be  This test is not yet approved or cleared by the [place] FDA and has been authorized for detection and/or diagnosis of SARS-CoV-2 by FDA under an Emergency Use Authorization (EUA). This EUA will remain in effect (meaning this test can be used) for the duration of the COVID-19 declaration under Section 564(b)(1) of the Act, 21 U.S.C. section 360bbb-3(b)(1), unless the authorization is terminated or revoked.  Performed at [facility], [place] [address], [place], [place] [address]      Time coordinating discharge: Over 30 minutes  SIGNED:   Ayah Cozzolino J [place] DO  [facility] 06/17/2022, 9:36 AM

## 2022-06-17 NOTE — Progress Notes (Signed)
Susan [name] to be D/C'd Skilled nursing facility per MD order.  Discussed with the patient and all questions fully answered.  VSS, Skin clean, dry and intact without evidence of skin break down, no evidence of skin tears noted. IV catheter discontinued intact. Site without signs and symptoms of complications. Dressing and pressure applied. Wound vac replaced with prevena wound vac for discharge.   An After Visit Summary was printed and given to PTAR.  D/c education completed with patient/family including follow up instructions, medication list, d/c activities limitations if indicated, with other d/c instructions as indicated by MD - patient able to verbalize understanding, all questions fully answered.   Attempted to call report twice to receiving RN at [facility], but nurse unavaliable. Call back number given for report.   Patient instructed to return to ED, call 911, or call MD for any changes in condition.   Patient escorted via stretcher, and D/C home via non-emergency ambulance.  Manuella Ghazi 06/17/2022 4:26 PM

## 2022-06-17 NOTE — TOC Transition Note (Signed)
Transition of Care [facility]) - CM/SW Discharge Note   Patient Details  Name: Susan [name] MRN: [id number] Date of Birth: January 24, 1951  Transition of Care [facility]) CM/SW Contact:  Milinda Antis, [place] Phone Number: 06/17/2022, 10:57 AM   Clinical Narrative:    Patient will DC to:  [place] date: 06/17/2022 Family notified: Patient alert and oriented Transport by: Corey Harold   Per MD patient ready for DC to SNF. RN to call report prior to discharge [phone] room 422). RN, patient, and facility notified of DC. Discharge Summary and FL2 sent to facility. DC packet on chart. Ambulance transport will be requested for patient.   CSW will sign off for now as social work intervention is no longer needed. Please consult [name] again if new needs arise.    Final next level of care: Skilled Nursing Facility Barriers to Discharge: Barriers Resolved   Patient Goals and CMS Choice CMS Medicare.gov Compare Post Acute Care list provided to:: Patient Choice offered to / list presented to : Patient  Discharge Placement                Patient chooses bed at:  [facility] SNF) Patient to be transferred to facility by: [place] Name of family member notified: Patient alert and oriented Patient and family notified of of transfer: 06/17/22  Discharge Plan and Services Additional resources added to the After Visit Summary for   In-house Referral: Clinical Social Work   Post Acute Care Choice: [place]                               Social Determinants of Health (SDOH) Interventions SDOH Screenings   Food Insecurity: No Food Insecurity (05/26/2022)  Housing: Low Risk  (05/26/2022)  Transportation Needs: No Transportation Needs (05/26/2022)  Utilities: Not At Risk (05/26/2022)  Alcohol Screen: Low Risk  (08/16/2021)  Depression (PHQ2-9): Low Risk  (04/23/2022)  Financial Resource Strain: Low Risk  (08/16/2021)  Physical Activity: Inactive (08/16/2021)   Social Connections: Moderately Isolated (08/16/2021)  Stress: No Stress Concern Present (08/16/2021)  Tobacco Use: Medium Risk (06/16/2022)     Readmission Risk Interventions    05/12/2022    5:04 PM 02/07/2022    2:57 PM 01/23/2022   12:38 PM  Readmission Risk Prevention Plan  Transportation Screening Complete Complete Complete  PCP or Specialist Appt within 3-5 Days   Complete  HRI or [place]   Complete  Social Work Consult for [place] Planning/Counseling   Complete  Palliative Care Screening   Not Applicable  Medication Review Press photographer)  Complete Complete  PCP or Specialist appointment within 3-5 days of discharge  Complete   HRI or [place]  Complete   SW Recovery Care/Counseling Consult Complete Complete   Palliative Care Screening  Not [place] Complete Not Applicable

## 2022-06-18 ENCOUNTER — Telehealth: Payer: Self-pay

## 2022-06-18 DIAGNOSIS — N186 End stage renal disease: Secondary | ICD-10-CM | POA: Diagnosis not present

## 2022-06-18 DIAGNOSIS — Z23 Encounter for immunization: Secondary | ICD-10-CM | POA: Diagnosis not present

## 2022-06-18 DIAGNOSIS — N2581 Secondary hyperparathyroidism of renal origin: Secondary | ICD-10-CM | POA: Diagnosis not present

## 2022-06-18 DIAGNOSIS — D509 Iron deficiency anemia, unspecified: Secondary | ICD-10-CM | POA: Diagnosis not present

## 2022-06-18 DIAGNOSIS — D689 Coagulation defect, unspecified: Secondary | ICD-10-CM | POA: Diagnosis not present

## 2022-06-18 DIAGNOSIS — E1142 Type 2 diabetes mellitus with diabetic polyneuropathy: Secondary | ICD-10-CM | POA: Diagnosis not present

## 2022-06-18 DIAGNOSIS — Z992 Dependence on renal dialysis: Secondary | ICD-10-CM | POA: Diagnosis not present

## 2022-06-18 DIAGNOSIS — E1122 Type 2 diabetes mellitus with diabetic chronic kidney disease: Secondary | ICD-10-CM | POA: Diagnosis not present

## 2022-06-18 NOTE — Telephone Encounter (Signed)
Myriam Jacobson, [facility] nurse at [facility] called concerning patient's wound vac coming undone.    Talked with Autumn F., [name] and was advised to let Myriam Jacobson know to remove wound vac and apply a dry dressing and for patient to be seen on Friday, 06/20/2022.  Donia Guiles of message above and she voiced that she understands.

## 2022-06-19 ENCOUNTER — Telehealth: Payer: Self-pay | Admitting: Physician Assistant

## 2022-06-19 DIAGNOSIS — M6281 Muscle weakness (generalized): Secondary | ICD-10-CM | POA: Diagnosis not present

## 2022-06-19 DIAGNOSIS — J9611 Chronic respiratory failure with hypoxia: Secondary | ICD-10-CM | POA: Diagnosis not present

## 2022-06-19 DIAGNOSIS — Z4781 Encounter for orthopedic aftercare following surgical amputation: Secondary | ICD-10-CM | POA: Diagnosis not present

## 2022-06-19 DIAGNOSIS — I959 Hypotension, unspecified: Secondary | ICD-10-CM | POA: Diagnosis not present

## 2022-06-19 DIAGNOSIS — Z992 Dependence on renal dialysis: Secondary | ICD-10-CM | POA: Diagnosis not present

## 2022-06-19 DIAGNOSIS — R2689 Other abnormalities of gait and mobility: Secondary | ICD-10-CM | POA: Diagnosis not present

## 2022-06-19 DIAGNOSIS — I5042 Chronic combined systolic (congestive) and diastolic (congestive) heart failure: Secondary | ICD-10-CM | POA: Diagnosis not present

## 2022-06-19 NOTE — Telephone Encounter (Signed)
-----  Message from Ulyses Amor, [place] sent at 06/16/2022  6:59 AM EST -----  Needs f/u on PA schd to discuss second stage basilic fistula 2-3 weeks she is currently in the hospital.  No study needed

## 2022-06-20 ENCOUNTER — Encounter: Payer: PPO | Admitting: Family

## 2022-06-20 DIAGNOSIS — J9610 Chronic respiratory failure, unspecified whether with hypoxia or hypercapnia: Secondary | ICD-10-CM | POA: Diagnosis not present

## 2022-06-20 DIAGNOSIS — T8130XD Disruption of wound, unspecified, subsequent encounter: Secondary | ICD-10-CM | POA: Diagnosis not present

## 2022-06-20 DIAGNOSIS — I120 Hypertensive chronic kidney disease with stage 5 chronic kidney disease or end stage renal disease: Secondary | ICD-10-CM | POA: Diagnosis not present

## 2022-06-20 DIAGNOSIS — A0472 Enterocolitis due to Clostridium difficile, not specified as recurrent: Secondary | ICD-10-CM | POA: Diagnosis not present

## 2022-06-20 LAB — BPAM RBC
Blood Product Expiration Date: 202402112359
Blood Product Expiration Date: 202402142359
ISSUE DATE / TIME: 202401221119
Unit Type and Rh: 6200
Unit Type and Rh: 6200

## 2022-06-20 LAB — TYPE AND SCREEN
ABO/RH(D): A POS
Antibody Screen: POSITIVE
DAT, IgG: NEGATIVE
Donor AG Type: NEGATIVE
Donor AG Type: NEGATIVE
Unit division: 0
Unit division: 0

## 2022-06-23 DIAGNOSIS — T8189XA Other complications of procedures, not elsewhere classified, initial encounter: Secondary | ICD-10-CM | POA: Diagnosis not present

## 2022-06-23 DIAGNOSIS — I5042 Chronic combined systolic (congestive) and diastolic (congestive) heart failure: Secondary | ICD-10-CM | POA: Diagnosis not present

## 2022-06-23 DIAGNOSIS — M6281 Muscle weakness (generalized): Secondary | ICD-10-CM | POA: Diagnosis not present

## 2022-06-23 DIAGNOSIS — N2581 Secondary hyperparathyroidism of renal origin: Secondary | ICD-10-CM | POA: Diagnosis not present

## 2022-06-23 DIAGNOSIS — L89153 Pressure ulcer of sacral region, stage 3: Secondary | ICD-10-CM | POA: Diagnosis not present

## 2022-06-23 DIAGNOSIS — N186 End stage renal disease: Secondary | ICD-10-CM | POA: Diagnosis not present

## 2022-06-23 DIAGNOSIS — Z4781 Encounter for orthopedic aftercare following surgical amputation: Secondary | ICD-10-CM | POA: Diagnosis not present

## 2022-06-23 DIAGNOSIS — D631 Anemia in chronic kidney disease: Secondary | ICD-10-CM | POA: Diagnosis not present

## 2022-06-23 DIAGNOSIS — Z23 Encounter for immunization: Secondary | ICD-10-CM | POA: Diagnosis not present

## 2022-06-23 DIAGNOSIS — I959 Hypotension, unspecified: Secondary | ICD-10-CM | POA: Diagnosis not present

## 2022-06-23 DIAGNOSIS — D509 Iron deficiency anemia, unspecified: Secondary | ICD-10-CM | POA: Diagnosis not present

## 2022-06-23 DIAGNOSIS — R2689 Other abnormalities of gait and mobility: Secondary | ICD-10-CM | POA: Diagnosis not present

## 2022-06-23 DIAGNOSIS — J9611 Chronic respiratory failure with hypoxia: Secondary | ICD-10-CM | POA: Diagnosis not present

## 2022-06-23 DIAGNOSIS — Z992 Dependence on renal dialysis: Secondary | ICD-10-CM | POA: Diagnosis not present

## 2022-06-23 DIAGNOSIS — D689 Coagulation defect, unspecified: Secondary | ICD-10-CM | POA: Diagnosis not present

## 2022-06-24 ENCOUNTER — Encounter: Payer: Self-pay | Admitting: Family

## 2022-06-24 ENCOUNTER — Ambulatory Visit (INDEPENDENT_AMBULATORY_CARE_PROVIDER_SITE_OTHER): Payer: PPO | Admitting: Family

## 2022-06-24 DIAGNOSIS — T8781 Dehiscence of amputation stump: Secondary | ICD-10-CM

## 2022-06-24 NOTE — Progress Notes (Signed)
Office Visit Note   Patient: Susan [name]           Date of Birth: 1950/10/14           MRN: [id number] Visit Date: 06/24/2022              Requested by: [name], MD [address] [place],  [place] [address] PCP: [name], MD  Chief Complaint  Patient presents with   Right Leg - Routine Post Op    06/13/22 revision right BKA      HPI: Patient is a 72 year old woman who presents 10 days status post right transtibial amputation with application of Kerecis micro graft.  Assessment & Plan: Visit Diagnoses:  1. Dehiscence of amputation stump (HCC)     Plan: A prescription was provided for doxycycline 100 mg twice a day for 10 days.  Orders written for dose of cleansing dry dressing changes daily.  Follow-Up Instructions: Return in about 1 week (around 07/01/2022).   Ortho Exam  Patient is alert, oriented, no adenopathy, well-dressed, normal affect, normal respiratory effort. Examination there is some mild cellulitis around the surgical incision.  The surgical incision is well-approximated.  The area of cellulitis is approximately 3 cm on either side of the incision.  No purulent drainage she does have tenderness to palpation.  Imaging: No results found.   Labs: Lab Results  Component Value Date   HGBA1C 8.9 (H) 05/02/2022   HGBA1C >15.5 (H) 01/20/2022   HGBA1C >15.5 (H) 01/19/2022   CRP 0.7 08/22/2020   CRP 1.5 (H) 03/13/2020   CRP 1.9 (H) 03/12/2020   LABURIC 4.7 08/09/2020   LABURIC 9.1 (H) 10/12/2018   LABURIC 9.0 (H) 03/06/2018   REPTSTATUS 06/10/2022 FINAL 06/05/2022   GRAMSTAIN  05/05/2022    FEW WBC PRESENT, PREDOMINANTLY PMN NO ORGANISMS SEEN    CULT  06/05/2022    NO GROWTH 5 DAYS Performed at [facility], [place] [address] [place], [place] [address]    LABORGA SERRATIA MARCESCENS 05/05/2022     Lab Results  Component Value Date   ALBUMIN 2.0 (L) 06/16/2022   ALBUMIN 2.2 (L) 06/14/2022   ALBUMIN 2.1 (L) 06/13/2022    PREALBUMIN 13 (L) 05/02/2022    Lab Results  Component Value Date   MG 2.5 (H) 06/06/2022   MG 1.6 (L) 05/29/2022   MG 1.7 05/28/2022   Lab Results  Component Value Date   VD25OH 42.14 05/02/2022    Lab Results  Component Value Date   PREALBUMIN 13 (L) 05/02/2022      Latest Ref Rng & Units 06/17/2022    6:58 AM 06/16/2022    9:34 PM 06/16/2022    3:43 AM  CBC EXTENDED  WBC 4.0 - 10.5 K/uL 9.6  10.6  8.0   RBC 3.87 - 5.11 MIL/uL 2.96  3.21  2.62   Hemoglobin 12.0 - 15.0 g/dL 7.8  8.4  6.9  C  HCT 36.0 - 46.0 % 25.1  27.5  23.1   Platelets 150 - 400 K/uL 233  226  237     C Corrected result     There is no height or weight on file to calculate BMI.  Orders:  No orders of the defined types were placed in this encounter.  No orders of the defined types were placed in this encounter.    Procedures: No procedures performed  Clinical Data: No additional findings.  ROS:  All other systems negative,  except as noted in the HPI. Review of Systems  Objective: Vital Signs: There were no vitals taken for this visit.  Specialty Comments:  No specialty comments available.  PMFS History: Patient Active Problem List   Diagnosis Date Noted   Constipation 06/07/2022   History of Clostridioides difficile colitis 06/06/2022   Below-knee amputation of right lower extremity ([place]) 06/06/2022   Diverticulitis 06/05/2022   Stercoral colitis 06/05/2022   C. difficile colitis 06/05/2022   Spleen hematoma 06/05/2022   Dehiscence of amputation stump of right lower extremity ([place]) 06/05/2022   Rectal ulcer 05/27/2022   ESRD on dialysis ([place]) 05/27/2022   GI bleed 05/23/2022   Difficult intravenous access 05/23/2022   Gangrene of right foot ([place]) 05/02/2022   S/P BKA (below knee amputation) unilateral, right ([place]) 05/02/2022   Unspecified protein-calorie malnutrition ([place]) 04/15/2022   Secondary hyperparathyroidism of renal origin ([place]) 04/14/2022   Coagulation defect,  unspecified ([place]) 04/09/2022   Acquired absence of other left toe(s) ([place]) 04/07/2022   Allergy, unspecified, initial encounter 04/07/2022   Dependence on renal dialysis ([place]) 04/07/2022   Gout due to renal impairment, unspecified site 04/07/2022   Hypertensive heart and chronic kidney disease with heart failure and with stage 5 chronic kidney disease, or end stage renal disease ([place]) 04/07/2022   Personal history of transient ischemic attack (TIA), and cerebral infarction without residual deficits 04/07/2022   Renal osteodystrophy 04/07/2022   Venous stasis ulcer of right calf ([place]) 03/31/2022   Fistula, colovaginal 03/26/2022   Diarrhea 03/26/2022   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   Sepsis without acute organ dysfunction ([place])    Bacteremia    Acute pancreatitis 02/01/2022   Abdominal pain 02/01/2022   SIRS (systemic inflammatory response syndrome) ([place]) 02/01/2022   Transaminitis 02/01/2022   History of anemia due to chronic kidney disease 02/01/2022   Paroxysmal atrial fibrillation ([place]) 02/01/2022   DKA (diabetic ketoacidosis) ([place]) 01/14/2022   NSTEMI (non-ST elevated myocardial infarction) ([place]) 03/05/2021   Acute renal failure superimposed on stage 4 chronic kidney disease ([place]) 08/22/2020   Hypoalbuminemia 05/25/2020   GERD (gastroesophageal reflux disease) 05/25/2020   Pressure injury of skin 05/17/2020   Type 2 diabetes mellitus with diabetic polyneuropathy, with long-term current use of insulin ([place]) 03/07/2020   Obesity, Class III, BMI 40-49.9 (morbid obesity) ([place]) 03/07/2020   Common bile duct (CBD) obstruction 05/28/2019   Benign neoplasm of ascending colon    Benign neoplasm of transverse colon    Benign neoplasm of descending colon    Benign neoplasm of sigmoid colon    Gastric polyps    Hyperkalemia 03/11/2019   Prolonged QT interval 03/11/2019   Acute blood loss anemia 03/11/2019   Onychomycosis 06/21/2018   Osteomyelitis of second toe of right foot ([place])     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 (HCC)    PVD (peripheral vascular disease) ([place]) 10/26/2017   E-coli UTI 07/27/2017   Hypothyroidism 07/27/2017   AKI (acute kidney injury) ([place])    [place] (pulmonary artery hypertension) (HCC)    Impaired ambulation 07/19/2017   Nausea & vomiting 07/15/2017   Leg cramps 02/27/2017   Peripheral edema 01/12/2017   Diabetic neuropathy ([place]) 11/12/2016   CKD (chronic kidney disease), stage IV ([place]) 10/24/2015   Anemia of chronic disease 10/03/2015   Generalized anxiety disorder 10/03/2015   Insomnia 10/03/2015   Hyperglycemia due to diabetes mellitus ([place]) 06/07/2015   Chronic diastolic CHF (congestive heart failure) ([place]) 06/07/2015   Non compliance with medical treatment  04/17/2014   Rotator cuff tear 03/14/2014   Class 3 obesity ([place]) 09/23/2013   Chronic HFrEF (heart failure with reduced ejection fraction) ([place]) 06/03/2013   Hypotension 12/25/2012   Urinary incontinence    MDD (major depressive disorder) 11/12/2010   RBBB (right bundle branch block)    Coronary artery disease    Hyperlipemia 01/22/2009   Essential hypertension 01/22/2009   Past Medical History:  Diagnosis Date   Acute MI ([place]) 1999; 2007; 03/05/21   Anemia    hx   Anginal pain (HCC)    Anxiety    ARF (acute renal failure) ([place]) 06/2017   [facility]   Arthritis    "generalized" (03/15/2014)   CAD (coronary artery disease)    MI in 2000 - MI  2007 - treated bare metal stent (no nuclear since then as 9/11)   Carotid artery disease (HCC)    CHF (congestive heart failure) ([place])    HFrEF 03/06/21   Chronic diastolic heart failure ([place])    a) ECHO (08/2013) EF 55-60% and RV function nl b) RHC (08/2013) RA 4, RV 30/5/7, PA 25/10 (16), PCWP 7, Fick CO/CI 6.3/2.7, PVR 1.5 WU, PA 61 and 66%   Daily headache    "~ every other day; since I fell in June" (03/15/2014)   Depression    Dyslipidemia    Dyspnea    uses oxygen qhs via [place]   ESRD (end  stage renal disease) ([place])    Dialysis on Tues Thurs Sat   Exertional shortness of breath    History of kidney stones    HTN (hypertension)    Hypothyroidism    Neuropathy    Obesity    Osteoarthritis    PAF (paroxysmal atrial fibrillation) (HCC)    Peripheral neuropathy    bilateral feet/hands   PONV (postoperative nausea and vomiting)    RBBB (right bundle branch block)    Old   Stroke ([place])    mini strokes   Syncope    likely due to low blood sugar   Tachycardia    Sinus tachycardia   Type II diabetes mellitus (HCC)    Type II, Lamar Sprinkles libre left upper arm. patient has omnipod insulin pump with Novolin R Insulin   Urinary incontinence    Venous insufficiency     Family History  Problem Relation Age of Onset   Heart attack Mother 88    Past Surgical History:  Procedure Laterality Date   ABDOMINAL HYSTERECTOMY  1980's   AMPUTATION Right 02/24/2018   Procedure: RIGHT FOOT GREAT TOE AND 2ND TOE AMPUTATION;  Surgeon: Newt Minion, MD;  Location: [place];  Service: Orthopedics;  Laterality: Right;   AMPUTATION Right 04/30/2018   Procedure: RIGHT TRANSMETATARSAL AMPUTATION;  Surgeon: Newt Minion, MD;  Location: [place];  Service: Orthopedics;  Laterality: Right;   AMPUTATION Right 05/02/2022   Procedure: RIGHT BELOW KNEE AMPUTATION;  Surgeon: Newt Minion, MD;  Location: [place];  Service: Orthopedics;  Laterality: Right;   APPLICATION OF WOUND VAC Right 06/13/2022   Procedure: APPLICATION OF WOUND VAC;  Surgeon: Newt Minion, MD;  Location: [place];  Service: Orthopedics;  Laterality: Right;   AV FISTULA PLACEMENT Left 04/02/2022   Procedure: LEFT ARM ARTERIOVENOUS (AV) FISTULA CREATION;  Surgeon: Serafina [place], MD;  Location: [place];  Service: Vascular;  Laterality: Left;  PERIPHERAL NERVE BLOCK   BIOPSY  05/27/2020   Procedure: BIOPSY;  Surgeon: [name], DO;  Location: [facility]  ENDO SUITE;  Service: Endoscopy;;   CATARACT EXTRACTION, BILATERAL Bilateral ?2013    COLONOSCOPY W/ POLYPECTOMY     COLONOSCOPY WITH PROPOFOL N/A 03/13/2019   Procedure: COLONOSCOPY WITH PROPOFOL;  Surgeon: Jerene Bears, MD;  Location: [place];  Service: Gastroenterology;  Laterality: N/A;   CORONARY ANGIOPLASTY WITH STENT PLACEMENT  1999; 2007   "1 + 1"   ERCP N/A 02/03/2022   Procedure: ENDOSCOPIC RETROGRADE CHOLANGIOPANCREATOGRAPHY (ERCP);  Surgeon: Carol Ada, MD;  Location: [place];  Service: Gastroenterology;  Laterality: N/A;   ESOPHAGOGASTRODUODENOSCOPY (EGD) WITH PROPOFOL N/A 03/13/2019   Procedure: ESOPHAGOGASTRODUODENOSCOPY (EGD) WITH PROPOFOL;  Surgeon: Jerene Bears, MD;  Location[facility] ENDOSCOPY;  Service: Gastroenterology;  Laterality: N/A;   ESOPHAGOGASTRODUODENOSCOPY (EGD) WITH PROPOFOL N/A 05/27/2020   Procedure: ESOPHAGOGASTRODUODENOSCOPY (EGD) WITH PROPOFOL;  Surgeon: [name], DO;  [facility]: [facility];  Service: Endoscopy;  Laterality: N/A;   EYE SURGERY Bilateral    lazer   FLEXIBLE SIGMOIDOSCOPY N/A 05/23/2022   Procedure: FLEXIBLE SIGMOIDOSCOPY;  Surgeon: Carol Ada, MD;  Location: [place];  Service: Gastroenterology;  Laterality: N/A;   FLEXIBLE SIGMOIDOSCOPY N/A 05/24/2022   Procedure: FLEXIBLE SIGMOIDOSCOPY;  Surgeon: Sharyn Creamer, MD;  Location: [place];  Service: Gastroenterology;  Laterality: N/A;   HEMOSTASIS CLIP PLACEMENT  03/13/2019   Procedure: HEMOSTASIS CLIP PLACEMENT;  Surgeon: Jerene Bears, MD;  Location: [facility] ENDOSCOPY;  Service: Gastroenterology;;   HEMOSTASIS CLIP PLACEMENT  05/23/2022   Procedure: HEMOSTASIS CLIP PLACEMENT;  Surgeon: Carol Ada, MD;  Location: [place];  Service: Gastroenterology;;   HEMOSTASIS CONTROL  05/24/2022   Procedure: HEMOSTASIS CONTROL;  Surgeon: Sharyn Creamer, MD;  Location: [place];  Service: Gastroenterology;;   HOT HEMOSTASIS N/A 05/23/2022   Procedure: HOT HEMOSTASIS (ARGON PLASMA COAGULATION/BICAP);  Surgeon: Carol Ada, MD;  Location: [place];   Service: Gastroenterology;  Laterality: N/A;   I & D EXTREMITY Left 05/05/2022   Procedure: IRRIGATION AND DEBRIDEMENT LEFT ARM AV FISTULA;  Surgeon: Marty Heck, MD;  Location: [place];  Service: Vascular;  Laterality: Left;   INSERTION OF DIALYSIS CATHETER Right 04/02/2022   Procedure: INSERTION OF TUNNELED DIALYSIS CATHETER;  Surgeon: Serafina [place], MD;  [facility];  Service: Vascular;  Laterality: Right;   KNEE ARTHROSCOPY Left 10/25/2006   POLYPECTOMY  03/13/2019   Procedure: POLYPECTOMY;  Surgeon: Jerene Bears, MD;  Location: [place] ENDOSCOPY;  Service: Gastroenterology;;   REMOVAL OF STONES  02/03/2022   Procedure: REMOVAL OF STONES;  Surgeon: Carol Ada, MD;  Location: [place];  Service: Gastroenterology;;   RIGHT HEART CATH N/A 07/24/2017   Procedure: RIGHT HEART CATH;  Surgeon: Jolaine Artist, MD;  Location: [facility];  Service: Cardiovascular;  Laterality: N/A;   RIGHT HEART CATHETERIZATION N/A 09/22/2013   Procedure: RIGHT HEART CATH;  Surgeon: Jolaine Artist, MD;  Location: [facility] CATH LAB;  Service: Cardiovascular;  Laterality: N/A;   SHOULDER ARTHROSCOPY WITH OPEN ROTATOR CUFF REPAIR Right 03/14/2014   Procedure: RIGHT SHOULDER ARTHROSCOPY WITH BICEPS RELEASE, OPEN SUBSCAPULA REPAIR, OPEN SUPRASPINATUS REPAIR.;  Surgeon: Meredith Pel, MD;  Location: [place];  Service: Orthopedics;  Laterality: Right;   SPHINCTEROTOMY  02/03/2022   Procedure: SPHINCTEROTOMY;  Surgeon: Carol Ada, MD;  Location: [place];  Service: Gastroenterology;;   STUMP REVISION Right 06/13/2022   Procedure: REVISION RIGHT BELOW KNEE AMPUTATION;  Surgeon: Newt Minion, MD;  Location: [place];  Service: Orthopedics;  Laterality: Right;   TEE WITHOUT CARDIOVERSION N/A  02/04/2022   Procedure: TRANSESOPHAGEAL ECHOCARDIOGRAM (TEE);  Surgeon: Jolaine Artist, MD;  Location: [facility] ENDOSCOPY;  Service: Cardiovascular;  Laterality: N/A;   TOE AMPUTATION Right 02/24/2018   GREAT  TOE AND 2ND TOE AMPUTATION   TUBAL LIGATION  1970's   Social History   Occupational History    Employer: UNEMPLOYED  Tobacco Use   Smoking status: Former    Packs/day: 3.00    Years: 32.00    Total pack years: 96.00    Types: Cigarettes    Quit date: 10/24/1997    Years since quitting: 24.6   Smokeless tobacco: Never  Vaping Use   Vaping Use: Never used  Substance and Sexual Activity   Alcohol use: Not Currently    Comment: "might have 2-3 daiquiris in the summer"   Drug use: No   Sexual activity: Not Currently    Birth control/protection: Surgical    Comment: Hysterectomy

## 2022-06-25 DIAGNOSIS — Z992 Dependence on renal dialysis: Secondary | ICD-10-CM | POA: Diagnosis not present

## 2022-06-25 DIAGNOSIS — N186 End stage renal disease: Secondary | ICD-10-CM | POA: Diagnosis not present

## 2022-06-25 DIAGNOSIS — N179 Acute kidney failure, unspecified: Secondary | ICD-10-CM | POA: Diagnosis not present

## 2022-06-26 DIAGNOSIS — J9611 Chronic respiratory failure with hypoxia: Secondary | ICD-10-CM | POA: Diagnosis not present

## 2022-06-26 DIAGNOSIS — Z992 Dependence on renal dialysis: Secondary | ICD-10-CM | POA: Diagnosis not present

## 2022-06-26 DIAGNOSIS — I959 Hypotension, unspecified: Secondary | ICD-10-CM | POA: Diagnosis not present

## 2022-06-26 DIAGNOSIS — R2689 Other abnormalities of gait and mobility: Secondary | ICD-10-CM | POA: Diagnosis not present

## 2022-06-26 DIAGNOSIS — M6281 Muscle weakness (generalized): Secondary | ICD-10-CM | POA: Diagnosis not present

## 2022-06-26 DIAGNOSIS — Z4781 Encounter for orthopedic aftercare following surgical amputation: Secondary | ICD-10-CM | POA: Diagnosis not present

## 2022-06-26 DIAGNOSIS — I5042 Chronic combined systolic (congestive) and diastolic (congestive) heart failure: Secondary | ICD-10-CM | POA: Diagnosis not present

## 2022-06-27 DIAGNOSIS — Z992 Dependence on renal dialysis: Secondary | ICD-10-CM | POA: Diagnosis not present

## 2022-06-27 DIAGNOSIS — D689 Coagulation defect, unspecified: Secondary | ICD-10-CM | POA: Diagnosis not present

## 2022-06-27 DIAGNOSIS — D509 Iron deficiency anemia, unspecified: Secondary | ICD-10-CM | POA: Diagnosis not present

## 2022-06-27 DIAGNOSIS — N186 End stage renal disease: Secondary | ICD-10-CM | POA: Diagnosis not present

## 2022-06-27 DIAGNOSIS — N2581 Secondary hyperparathyroidism of renal origin: Secondary | ICD-10-CM | POA: Diagnosis not present

## 2022-06-30 DIAGNOSIS — J9611 Chronic respiratory failure with hypoxia: Secondary | ICD-10-CM | POA: Diagnosis not present

## 2022-06-30 DIAGNOSIS — Z4781 Encounter for orthopedic aftercare following surgical amputation: Secondary | ICD-10-CM | POA: Diagnosis not present

## 2022-06-30 DIAGNOSIS — I5042 Chronic combined systolic (congestive) and diastolic (congestive) heart failure: Secondary | ICD-10-CM | POA: Diagnosis not present

## 2022-06-30 DIAGNOSIS — M6281 Muscle weakness (generalized): Secondary | ICD-10-CM | POA: Diagnosis not present

## 2022-06-30 DIAGNOSIS — N186 End stage renal disease: Secondary | ICD-10-CM | POA: Diagnosis not present

## 2022-06-30 DIAGNOSIS — Z992 Dependence on renal dialysis: Secondary | ICD-10-CM | POA: Diagnosis not present

## 2022-06-30 DIAGNOSIS — D509 Iron deficiency anemia, unspecified: Secondary | ICD-10-CM | POA: Diagnosis not present

## 2022-06-30 DIAGNOSIS — R2689 Other abnormalities of gait and mobility: Secondary | ICD-10-CM | POA: Diagnosis not present

## 2022-06-30 DIAGNOSIS — I959 Hypotension, unspecified: Secondary | ICD-10-CM | POA: Diagnosis not present

## 2022-06-30 DIAGNOSIS — D689 Coagulation defect, unspecified: Secondary | ICD-10-CM | POA: Diagnosis not present

## 2022-06-30 DIAGNOSIS — N2581 Secondary hyperparathyroidism of renal origin: Secondary | ICD-10-CM | POA: Diagnosis not present

## 2022-07-01 ENCOUNTER — Ambulatory Visit (INDEPENDENT_AMBULATORY_CARE_PROVIDER_SITE_OTHER): Payer: PPO | Admitting: Orthopedic Surgery

## 2022-07-01 DIAGNOSIS — T8130XD Disruption of wound, unspecified, subsequent encounter: Secondary | ICD-10-CM | POA: Diagnosis not present

## 2022-07-01 DIAGNOSIS — A0472 Enterocolitis due to Clostridium difficile, not specified as recurrent: Secondary | ICD-10-CM | POA: Diagnosis not present

## 2022-07-01 DIAGNOSIS — I120 Hypertensive chronic kidney disease with stage 5 chronic kidney disease or end stage renal disease: Secondary | ICD-10-CM | POA: Diagnosis not present

## 2022-07-01 DIAGNOSIS — J9610 Chronic respiratory failure, unspecified whether with hypoxia or hypercapnia: Secondary | ICD-10-CM | POA: Diagnosis not present

## 2022-07-01 DIAGNOSIS — Z89511 Acquired absence of right leg below knee: Secondary | ICD-10-CM

## 2022-07-03 ENCOUNTER — Telehealth: Payer: Self-pay

## 2022-07-03 ENCOUNTER — Encounter: Payer: Self-pay | Admitting: Physician Assistant

## 2022-07-03 ENCOUNTER — Ambulatory Visit (INDEPENDENT_AMBULATORY_CARE_PROVIDER_SITE_OTHER): Payer: PPO | Admitting: Physician Assistant

## 2022-07-03 VITALS — BP 138/77 | HR 76 | Temp 98.0°F | Wt 220.0 lb

## 2022-07-03 DIAGNOSIS — I5042 Chronic combined systolic (congestive) and diastolic (congestive) heart failure: Secondary | ICD-10-CM | POA: Diagnosis not present

## 2022-07-03 DIAGNOSIS — Z4781 Encounter for orthopedic aftercare following surgical amputation: Secondary | ICD-10-CM | POA: Diagnosis not present

## 2022-07-03 DIAGNOSIS — R2689 Other abnormalities of gait and mobility: Secondary | ICD-10-CM | POA: Diagnosis not present

## 2022-07-03 DIAGNOSIS — M6281 Muscle weakness (generalized): Secondary | ICD-10-CM | POA: Diagnosis not present

## 2022-07-03 DIAGNOSIS — N186 End stage renal disease: Secondary | ICD-10-CM

## 2022-07-03 DIAGNOSIS — Z992 Dependence on renal dialysis: Secondary | ICD-10-CM

## 2022-07-03 DIAGNOSIS — I959 Hypotension, unspecified: Secondary | ICD-10-CM | POA: Diagnosis not present

## 2022-07-03 DIAGNOSIS — J9611 Chronic respiratory failure with hypoxia: Secondary | ICD-10-CM | POA: Diagnosis not present

## 2022-07-03 NOTE — Telephone Encounter (Signed)
Attempted to reach pt to schedule surgery, no answer, lf vm on home phone to return call. Called mobile, no answer, vm full.

## 2022-07-03 NOTE — H&P (View-Only) (Signed)
POST OPERATIVE OFFICE NOTE    CC:  F/u for surgery  HPI:  This is a 72 y.o. 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 Trula Slade.  She was taken back to the OR on 05/05/2022 for I&D with drain placement for infected seroma by Dr. Carlis Abbott.  Her cx grew Serratia and she was discharged on abx.    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 Sharol Given.  She also had EUA with oversewing of rectal ulcer that was bleeding on 05/24/2022 by Dr. Kieth Brightly.  Pt states she does not have pain/numbness in the left hand.  She states that her left BKA is not healing quickly and she is going back to Dr. Sharol Given weekly and is on abx.    She states that she had been on Eliquis but this is not listing on her MAR from SN[facility].  The pt is on dialysis M/W/F at [place] location.   Allergies  Allergen Reactions   Cephalexin Diarrhea and Other (See Comments)   Codeine Nausea And Vomiting and Other (See Comments)    Current Outpatient Medications  Medication Sig Dispense Refill   albuterol (VENTOLIN HFA) 108 (90 Base) MCG/ACT inhaler Inhale 1-2 puffs into the lungs every 6 (six) hours as needed for wheezing or shortness of breath.     amiodarone (PACERONE) 200 MG tablet Take 1 tablet (200 mg total) by mouth daily. 90 tablet 3   aspirin EC 81 MG tablet Take 1 tablet (81 mg total) by mouth daily with breakfast. 30 tablet 11   B Complex-C-Folic Acid (DIALYVITE TABLET) TABS Take 1 tablet by mouth in the morning. (1000)     blood glucose meter kit and supplies Use up to four times daily as directed. 1 each 0   Continuous Blood Gluc Sensor (FREESTYLE LIBRE 2 SENSOR) MISC      diphenoxylate-atropine (LOMOTIL) 2.5-0.025 MG tablet Take 1 tablet by mouth 4 (four) times daily as needed for diarrhea or loose stools. 30 tablet 0   ferrous sulfate 325 (65 FE) MG tablet Take 325 mg by mouth 2 (two) times daily. (1000 & 2100)     Fingerstix Lancets MISC Use as directed to check  blood sugars as need up to 4 times daily [id number] each 0   folic acid-vitamin b complex-vitamin c-selenium-zinc (DIALYVITE) 3 MG TABS tablet Take 1 tablet by mouth daily.     hydrALAZINE (APRESOLINE) 25 MG tablet Take 1 tablet (25 mg total) by mouth every 6 (six) hours as needed (SBP>150 or DBP>100). 90 tablet 1   hydrocortisone (ANUSOL-HC) 2.5 % rectal cream Place 1 Application rectally 2 (two) times daily. (1000 & 2100)     insulin aspart (NOVOLOG FLEXPEN) 100 UNIT/ML FlexPen Inject 0-15 Units into the skin See admin instructions. Inject 5 units subcutaneously 3 times daily with meals (0900, 1300 & 1700) & as needed via sliding scale insulin CBG <70=Notify NP/MD 71-149=0 units,150-199=4 units, 200-249=6 units,250- 299=10 units, 300-349=12 units, 350-399=15 units, > 400 call MD ( prime pen with 2 units prior  to set dose)     insulin glargine (LANTUS SOLOSTAR) 100 UNIT/ML Solostar Pen Inject 40 Units into the skin at bedtime. 15 mL 11   Insulin Pen Needle 32G X 4 MM MISC Use as directed 200 each 0   levothyroxine (SYNTHROID, LEVOTHROID) 50 MCG tablet Take 1 tablet (50 mcg total) by mouth daily before breakfast. 90 tablet 1   linagliptin (TRADJENTA) 5  MG TABS tablet Take 1 tablet (5 mg total) by mouth daily. 30 tablet 0   LORazepam (ATIVAN) 0.5 MG tablet Take 1 tablet (0.5 mg total) by mouth every 8 (eight) hours as needed for anxiety. 30 tablet 0   magnesium oxide (MAG-OX) 400 (240 Mg) MG tablet Take 1 tablet (400 mg total) by mouth daily. 90 tablet 3   Nutritional Supplements (FEEDING SUPPLEMENT, GLUCERNA 1.2 CAL,) LIQD Take 237 mLs by mouth daily.     ondansetron (ZOFRAN) 4 MG tablet Take 4 mg by mouth every 8 (eight) hours as needed for nausea.     oxyCODONE-acetaminophen (PERCOCET/ROXICET) 5-325 MG tablet Take 1 tablet by mouth every 4 (four) hours as needed. 30 tablet 0   OXYGEN Inhale 2 L/min into the lungs at bedtime.     pantoprazole (PROTONIX) 40 MG tablet Take 1 tablet (40 mg total) by mouth  daily. 30 tablet 1   polyethylene glycol (MIRALAX / GLYCOLAX) 17 g packet Take 17 g by mouth 2 (two) times daily. 14 each 0   pregabalin (LYRICA) 75 MG capsule Take 1 capsule (75 mg total) by mouth daily. 30 capsule 0   saccharomyces boulardii (FLORASTOR) 250 MG capsule Take 500 mg by mouth 2 (two) times daily. X14 days     senna-docusate (SENOKOT-S) 8.6-50 MG tablet Take 2 tablets by mouth 2 (two) times daily.     sevelamer carbonate (RENVELA) 800 MG tablet Take 1,600 mg by mouth 3 (three) times daily with meals. (0800, 1200 & 1700)     vortioxetine HBr (TRINTELLIX) 20 MG TABS tablet Take 20 mg by mouth in the morning. (1000)     No current facility-administered medications for this visit.     ROS:  See HPI  Physical Exam:  Today's Vitals   07/03/22 1122  BP: 138/77  Pulse: 76  Temp: 98 F (36.7 C)  TempSrc: Temporal  SpO2: 100%  Weight: 220 lb (99.8 kg)   Body mass index is 36.61 kg/m.   Incision:  well healed Extremities:   There is a palpable left radial pulse.   Motor and sensory are in tact.   There is a thrill/bruit present.  Access is  easily palpable   Dialysis Duplex on 06/13/2022: Findings:  +--------------------+----------+-----------------+--------+  AVF                PSV (cm/s)Flow Vol (mL/min)Comments  +--------------------+----------+-----------------+--------+  Native artery inflow   171           415                 +--------------------+----------+-----------------+--------+  AVF Anastomosis        252                               +--------------------+----------+-----------------+--------+  +--------------------+----------+-------------+----------+-----------------  OUTFLOW VEIN        PSV (cm/s)Diameter (cm)Depth (cm)      Describe    +--------------------+----------+-------------+----------+-----------------  [place] at [place]    81        0.61        1.13                      +--------------------+----------+-------------+----------+-----------------  Prox UA                 53        0.68        1.60                     +--------------------+----------+-------------+----------+-----------------  Mid UA                 126        0.74        1.66     Retained valve and competing branch    +--------------------+----------+-------------+----------+-----------------  Dist UA                649        0.40        1.12                     +--------------------+----------+-------------+----------+----------------    Assessment/Plan:  This is a 72 y.o. female who is s/p: left BVT and TDC placement on 04/02/2022 by Dr. Trula Slade.  She was taken back to the OR on 05/05/2022 for I&D with drain placement for infected seroma by Dr. Carlis Abbott.  Her cx grew Serratia and she was discharged on abx.    -the pt does not have evidence of steal.  She has a good thrill in the fistula. -pt will need 2nd stage left BVT-we will schedule this in the next couple of weeks on a Thursday given she had HD on M/W/F and is seeing Dr. Sharol Given on Tuesdays.   -if pt has tunneled catheter, this can be removed at the discretion of the dialysis center once the pt's access has been successfully cannulated to their satisfaction.  -discussed with pt that access does not last forever and will need intervention or even new access at some point.  She expressed understanding.    Leontine Locket, [facility] [phone]  Clinic MD:  Donzetta Matters

## 2022-07-03 NOTE — Progress Notes (Signed)
POST OPERATIVE OFFICE NOTE    CC:  F/u for surgery  HPI:  This is a 72 y.o. 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 Brabham.  She was taken back to the OR on 05/05/2022 for I&D with drain placement for infected seroma by Dr. Clark.  Her cx grew Serratia and she was discharged on abx.    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 Duda.  She also had EUA with oversewing of rectal ulcer that was bleeding on 05/24/2022 by Dr. Kinsinger.  Pt states she does not have pain/numbness in the left hand.  She states that her left BKA is not healing quickly and she is going back to Dr. Duda weekly and is on abx.    She states that she had been on Eliquis but this is not listing on her MAR from SNF.  The pt is on dialysis M/W/F at [address] location.   Allergies  Allergen Reactions   Cephalexin Diarrhea and Other (See Comments)   Codeine Nausea And Vomiting and Other (See Comments)    Current Outpatient Medications  Medication Sig Dispense Refill   albuterol (VENTOLIN HFA) 108 (90 Base) MCG/ACT inhaler Inhale 1-2 puffs into the lungs every 6 (six) hours as needed for wheezing or shortness of breath.     amiodarone (PACERONE) 200 MG tablet Take 1 tablet (200 mg total) by mouth daily. 90 tablet 3   aspirin EC 81 MG tablet Take 1 tablet (81 mg total) by mouth daily with breakfast. 30 tablet 11   B Complex-C-Folic Acid (DIALYVITE TABLET) TABS Take 1 tablet by mouth in the morning. (1000)     blood glucose meter kit and supplies Use up to four times daily as directed. 1 each 0   Continuous Blood Gluc Sensor (FREESTYLE LIBRE 2 SENSOR) MISC      diphenoxylate-atropine (LOMOTIL) 2.5-0.025 MG tablet Take 1 tablet by mouth 4 (four) times daily as needed for diarrhea or loose stools. 30 tablet 0   ferrous sulfate 325 (65 FE) MG tablet Take 325 mg by mouth 2 (two) times daily. (1000 & 2100)     Fingerstix Lancets MISC Use as directed to check  blood sugars as need up to 4 times daily 100 each 0   folic acid-vitamin b complex-vitamin c-selenium-zinc (DIALYVITE) 3 MG TABS tablet Take 1 tablet by mouth daily.     hydrALAZINE (APRESOLINE) 25 MG tablet Take 1 tablet (25 mg total) by mouth every 6 (six) hours as needed (SBP>150 or DBP>100). 90 tablet 1   hydrocortisone (ANUSOL-HC) 2.5 % rectal cream Place 1 Application rectally 2 (two) times daily. (1000 & 2100)     insulin aspart (NOVOLOG FLEXPEN) 100 UNIT/ML FlexPen Inject 0-15 Units into the skin See admin instructions. Inject 5 units subcutaneously 3 times daily with meals (0900, 1300 & 1700) & as needed via sliding scale insulin CBG <70=Notify NP/MD 71-149=0 units,150-199=4 units, 200-249=6 units,250- 299=10 units, 300-349=12 units, 350-399=15 units, > 400 call MD ( prime pen with 2 units prior  to set dose)     insulin glargine (LANTUS SOLOSTAR) 100 UNIT/ML Solostar Pen Inject 40 Units into the skin at bedtime. 15 mL 11   Insulin Pen Needle 32G X 4 MM MISC Use as directed 200 each 0   levothyroxine (SYNTHROID, LEVOTHROID) 50 MCG tablet Take 1 tablet (50 mcg total) by mouth daily before breakfast. 90 tablet 1   linagliptin (TRADJENTA) 5   MG TABS tablet Take 1 tablet (5 mg total) by mouth daily. 30 tablet 0   LORazepam (ATIVAN) 0.5 MG tablet Take 1 tablet (0.5 mg total) by mouth every 8 (eight) hours as needed for anxiety. 30 tablet 0   magnesium oxide (MAG-OX) 400 (240 Mg) MG tablet Take 1 tablet (400 mg total) by mouth daily. 90 tablet 3   Nutritional Supplements (FEEDING SUPPLEMENT, GLUCERNA 1.2 CAL,) LIQD Take 237 mLs by mouth daily.     ondansetron (ZOFRAN) 4 MG tablet Take 4 mg by mouth every 8 (eight) hours as needed for nausea.     oxyCODONE-acetaminophen (PERCOCET/ROXICET) 5-325 MG tablet Take 1 tablet by mouth every 4 (four) hours as needed. 30 tablet 0   OXYGEN Inhale 2 L/min into the lungs at bedtime.     pantoprazole (PROTONIX) 40 MG tablet Take 1 tablet (40 mg total) by mouth  daily. 30 tablet 1   polyethylene glycol (MIRALAX / GLYCOLAX) 17 g packet Take 17 g by mouth 2 (two) times daily. 14 each 0   pregabalin (LYRICA) 75 MG capsule Take 1 capsule (75 mg total) by mouth daily. 30 capsule 0   saccharomyces boulardii (FLORASTOR) 250 MG capsule Take 500 mg by mouth 2 (two) times daily. X14 days     senna-docusate (SENOKOT-S) 8.6-50 MG tablet Take 2 tablets by mouth 2 (two) times daily.     sevelamer carbonate (RENVELA) 800 MG tablet Take 1,600 mg by mouth 3 (three) times daily with meals. (0800, 1200 & 1700)     vortioxetine HBr (TRINTELLIX) 20 MG TABS tablet Take 20 mg by mouth in the morning. (1000)     No current facility-administered medications for this visit.     ROS:  See HPI  Physical Exam:  Today's Vitals   07/03/22 1122  BP: 138/77  Pulse: 76  Temp: 98 F (36.7 C)  TempSrc: Temporal  SpO2: 100%  Weight: 220 lb (99.8 kg)   Body mass index is 36.61 kg/m.   Incision:  well healed Extremities:   There is a palpable left radial pulse.   Motor and sensory are in tact.   There is a thrill/bruit present.  Access is  easily palpable   Dialysis Duplex on 06/13/2022: Findings:  +--------------------+----------+-----------------+--------+  AVF                PSV (cm/s)Flow Vol (mL/min)Comments  +--------------------+----------+-----------------+--------+  Native artery inflow   171           415                 +--------------------+----------+-----------------+--------+  AVF Anastomosis        252                               +--------------------+----------+-----------------+--------+  +--------------------+----------+-------------+----------+-----------------  OUTFLOW VEIN        PSV (cm/s)Diameter (cm)Depth (cm)      Describe    +--------------------+----------+-------------+----------+-----------------  [place] at [place]    81        0.61        1.13                      +--------------------+----------+-------------+----------+-----------------  Prox UA                 53        0.68        1.60                     +--------------------+----------+-------------+----------+-----------------    Mid UA                 126        0.74        1.66     Retained valve and competing branch    +--------------------+----------+-------------+----------+-----------------  Dist UA                649        0.40        1.12                     +--------------------+----------+-------------+----------+----------------    Assessment/Plan:  This is a 72 y.o. female who is s/p: left BVT and TDC placement on 04/02/2022 by Dr. Brabham.  She was taken back to the OR on 05/05/2022 for I&D with drain placement for infected seroma by Dr. Clark.  Her cx grew Serratia and she was discharged on abx.    -the pt does not have evidence of steal.  She has a good thrill in the fistula. -pt will need 2nd stage left BVT-we will schedule this in the next couple of weeks on a Thursday given she had HD on M/W/F and is seeing Dr. Duda on Tuesdays.   -if pt has tunneled catheter, this can be removed at the discretion of the dialysis center once the pt's access has been successfully cannulated to their satisfaction.  -discussed with pt that access does not last forever and will need intervention or even new access at some point.  She expressed understanding.    Susan [name], [facility] [phone]  Clinic MD:  [place] 

## 2022-07-04 ENCOUNTER — Other Ambulatory Visit: Payer: Self-pay

## 2022-07-04 DIAGNOSIS — N186 End stage renal disease: Secondary | ICD-10-CM

## 2022-07-07 DIAGNOSIS — R110 Nausea: Secondary | ICD-10-CM | POA: Diagnosis not present

## 2022-07-07 DIAGNOSIS — N2581 Secondary hyperparathyroidism of renal origin: Secondary | ICD-10-CM | POA: Diagnosis not present

## 2022-07-07 DIAGNOSIS — T8189XA Other complications of procedures, not elsewhere classified, initial encounter: Secondary | ICD-10-CM | POA: Diagnosis not present

## 2022-07-07 DIAGNOSIS — I959 Hypotension, unspecified: Secondary | ICD-10-CM | POA: Diagnosis not present

## 2022-07-07 DIAGNOSIS — R2689 Other abnormalities of gait and mobility: Secondary | ICD-10-CM | POA: Diagnosis not present

## 2022-07-07 DIAGNOSIS — J9611 Chronic respiratory failure with hypoxia: Secondary | ICD-10-CM | POA: Diagnosis not present

## 2022-07-07 DIAGNOSIS — M6281 Muscle weakness (generalized): Secondary | ICD-10-CM | POA: Diagnosis not present

## 2022-07-07 DIAGNOSIS — I5042 Chronic combined systolic (congestive) and diastolic (congestive) heart failure: Secondary | ICD-10-CM | POA: Diagnosis not present

## 2022-07-07 DIAGNOSIS — N186 End stage renal disease: Secondary | ICD-10-CM | POA: Diagnosis not present

## 2022-07-07 DIAGNOSIS — Z4781 Encounter for orthopedic aftercare following surgical amputation: Secondary | ICD-10-CM | POA: Diagnosis not present

## 2022-07-07 DIAGNOSIS — L89153 Pressure ulcer of sacral region, stage 3: Secondary | ICD-10-CM | POA: Diagnosis not present

## 2022-07-07 DIAGNOSIS — D509 Iron deficiency anemia, unspecified: Secondary | ICD-10-CM | POA: Diagnosis not present

## 2022-07-07 DIAGNOSIS — D689 Coagulation defect, unspecified: Secondary | ICD-10-CM | POA: Diagnosis not present

## 2022-07-07 DIAGNOSIS — Z992 Dependence on renal dialysis: Secondary | ICD-10-CM | POA: Diagnosis not present

## 2022-07-07 DIAGNOSIS — D631 Anemia in chronic kidney disease: Secondary | ICD-10-CM | POA: Diagnosis not present

## 2022-07-10 DIAGNOSIS — I5042 Chronic combined systolic (congestive) and diastolic (congestive) heart failure: Secondary | ICD-10-CM | POA: Diagnosis not present

## 2022-07-10 DIAGNOSIS — T8130XD Disruption of wound, unspecified, subsequent encounter: Secondary | ICD-10-CM | POA: Diagnosis not present

## 2022-07-10 DIAGNOSIS — Z4781 Encounter for orthopedic aftercare following surgical amputation: Secondary | ICD-10-CM | POA: Diagnosis not present

## 2022-07-10 DIAGNOSIS — J9610 Chronic respiratory failure, unspecified whether with hypoxia or hypercapnia: Secondary | ICD-10-CM | POA: Diagnosis not present

## 2022-07-10 DIAGNOSIS — A0472 Enterocolitis due to Clostridium difficile, not specified as recurrent: Secondary | ICD-10-CM | POA: Diagnosis not present

## 2022-07-10 DIAGNOSIS — M6281 Muscle weakness (generalized): Secondary | ICD-10-CM | POA: Diagnosis not present

## 2022-07-10 DIAGNOSIS — I959 Hypotension, unspecified: Secondary | ICD-10-CM | POA: Diagnosis not present

## 2022-07-10 DIAGNOSIS — Z992 Dependence on renal dialysis: Secondary | ICD-10-CM | POA: Diagnosis not present

## 2022-07-10 DIAGNOSIS — I120 Hypertensive chronic kidney disease with stage 5 chronic kidney disease or end stage renal disease: Secondary | ICD-10-CM | POA: Diagnosis not present

## 2022-07-10 DIAGNOSIS — J9611 Chronic respiratory failure with hypoxia: Secondary | ICD-10-CM | POA: Diagnosis not present

## 2022-07-10 DIAGNOSIS — R2689 Other abnormalities of gait and mobility: Secondary | ICD-10-CM | POA: Diagnosis not present

## 2022-07-11 ENCOUNTER — Encounter: Payer: Self-pay | Admitting: Orthopedic Surgery

## 2022-07-11 NOTE — Progress Notes (Signed)
Office Visit Note   Patient: Susan [name]           Date of Birth: 1950/07/07           MRN: [id number] Visit Date: 07/01/2022              Requested by: [name], MD [address] [place],  [place] [id number] PCP: [name], MD  Chief Complaint  Patient presents with   Right Leg - Routine Post Op    06/13/2022 right BKA revision       HPI: Patient is a 72 year old woman who is 2 weeks status post right below-knee amputation with Kerecis micro graft.  Assessment & Plan: Visit Diagnoses:  1. Right below-knee amputee [facility])     Plan: Will continue with Dial soap cleansing for wound care continue with her compression sleeve.  Follow-Up Instructions: Return in about 2 weeks (around 07/15/2022).   Ortho Exam  Patient is alert, oriented, no adenopathy, well-dressed, normal affect, normal respiratory effort. Examination patient's wound is healing slowly she does have dermatitis and delayed wound healing.  There is no purulent drainage.  There is granulation tissue along the wound edges.  Imaging: No results found.   Labs: Lab Results  Component Value Date   HGBA1C 8.9 (H) 05/02/2022   HGBA1C >15.5 (H) 01/20/2022   HGBA1C >15.5 (H) 01/19/2022   CRP 0.7 08/22/2020   CRP 1.5 (H) 03/13/2020   CRP 1.9 (H) 03/12/2020   LABURIC 4.7 08/09/2020   LABURIC 9.1 (H) 10/12/2018   LABURIC 9.0 (H) 03/06/2018   REPTSTATUS 06/10/2022 FINAL 06/05/2022   GRAMSTAIN  05/05/2022    FEW WBC PRESENT, PREDOMINANTLY PMN NO ORGANISMS SEEN    CULT  06/05/2022    NO GROWTH 5 DAYS Performed at [facility], [place] [address], [place], [place] [address]    LABORGA SERRATIA MARCESCENS 05/05/2022     Lab Results  Component Value Date   ALBUMIN 2.0 (L) 06/16/2022   ALBUMIN 2.2 (L) 06/14/2022   ALBUMIN 2.1 (L) 06/13/2022   PREALBUMIN 13 (L) 05/02/2022    Lab Results  Component Value Date   MG 2.5 (H) 06/06/2022   MG 1.6 (L) 05/29/2022   MG 1.7 05/28/2022    Lab Results  Component Value Date   VD25OH 42.14 05/02/2022    Lab Results  Component Value Date   PREALBUMIN 13 (L) 05/02/2022      Latest Ref Rng & Units 06/17/2022    6:58 AM 06/16/2022    9:34 PM 06/16/2022    3:43 AM  CBC EXTENDED  WBC 4.0 - 10.5 K/uL 9.6  10.6  8.0   RBC 3.87 - 5.11 MIL/uL 2.96  3.21  2.62   Hemoglobin 12.0 - 15.0 g/dL 7.8  8.4  6.9  C  HCT 36.0 - 46.0 % 25.1  27.5  23.1   Platelets 150 - 400 K/uL 233  226  237     C Corrected result     There is no height or weight on file to calculate BMI.  Orders:  No orders of the defined types were placed in this encounter.  No orders of the defined types were placed in this encounter.    Procedures: No procedures performed  Clinical Data: No additional findings.  ROS:  All other systems negative, except as noted in the HPI. Review of Systems  Objective: Vital Signs: There were no vitals taken for this visit.  Specialty Comments:  No specialty comments available.  PMFS History: Patient Active Problem List   Diagnosis Date Noted   Constipation 06/07/2022   History of Clostridioides difficile colitis 06/06/2022   Below-knee amputation of right lower extremity ([place]) 06/06/2022   Diverticulitis 06/05/2022   Stercoral colitis 06/05/2022   C. difficile colitis 06/05/2022   Spleen hematoma 06/05/2022   Dehiscence of amputation stump of right lower extremity ([place]) 06/05/2022   Rectal ulcer 05/27/2022   ESRD on dialysis ([place]) 05/27/2022   GI bleed 05/23/2022   Difficult intravenous access 05/23/2022   Gangrene of right foot ([place]) 05/02/2022   S/P BKA (below knee amputation) unilateral, right ([place]) 05/02/2022   Unspecified protein-calorie malnutrition ([place]) 04/15/2022   Secondary hyperparathyroidism of renal origin ([place]) 04/14/2022   Coagulation defect, unspecified ([place]) 04/09/2022   Acquired absence of other left toe(s) ([place]) 04/07/2022   Allergy, unspecified, initial encounter 04/07/2022    Dependence on renal dialysis ([place]) 04/07/2022   Gout due to renal impairment, unspecified site 04/07/2022   Hypertensive heart and chronic kidney disease with heart failure and with stage 5 chronic kidney disease, or end stage renal disease ([place]) 04/07/2022   Personal history of transient ischemic attack (TIA), and cerebral infarction without residual deficits 04/07/2022   Renal osteodystrophy 04/07/2022   Venous stasis ulcer of right calf ([place]) 03/31/2022   Fistula, colovaginal 03/26/2022   Diarrhea 03/26/2022   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   Sepsis without acute organ dysfunction ([place])    Bacteremia    Acute pancreatitis 02/01/2022   Abdominal pain 02/01/2022   SIRS (systemic inflammatory response syndrome) ([place]) 02/01/2022   Transaminitis 02/01/2022   History of anemia due to chronic kidney disease 02/01/2022   Paroxysmal atrial fibrillation ([name]) 02/01/2022   DKA (diabetic ketoacidosis) ([place]) 01/14/2022   NSTEMI (non-ST elevated myocardial infarction) ([place]) 03/05/2021   Acute renal failure superimposed on stage 4 chronic kidney disease ([place]) 08/22/2020   Hypoalbuminemia 05/25/2020   GERD (gastroesophageal reflux disease) 05/25/2020   Pressure injury of skin 05/17/2020   Type 2 diabetes mellitus with diabetic polyneuropathy, with long-term current use of insulin ([place]) 03/07/2020   Obesity, Class III, BMI 40-49.9 (morbid obesity) ([place]) 03/07/2020   Common bile duct (CBD) obstruction 05/28/2019   Benign neoplasm of ascending colon    Benign neoplasm of transverse colon    Benign neoplasm of descending colon    Benign neoplasm of sigmoid colon    Gastric polyps    Hyperkalemia 03/11/2019   Prolonged QT interval 03/11/2019   Acute blood loss anemia 03/11/2019   Onychomycosis 06/21/2018   Osteomyelitis of second toe of right foot ([place])    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 (HCC)    PVD (peripheral vascular disease) ([place]) 10/26/2017   E-coli UTI  07/27/2017   Hypothyroidism 07/27/2017   AKI (acute kidney injury) ([place])    Quincy (pulmonary artery hypertension) ([place])    Impaired ambulation 07/19/2017   Nausea & vomiting 07/15/2017   Leg cramps 02/27/2017   Peripheral edema 01/12/2017   Diabetic neuropathy ([place]) 11/12/2016   CKD (chronic kidney disease), stage IV ([place]) 10/24/2015   Anemia of chronic disease 10/03/2015   Generalized anxiety disorder 10/03/2015   Insomnia 10/03/2015   Hyperglycemia due to diabetes mellitus ([place]) 06/07/2015   Chronic diastolic CHF (congestive heart failure) ([place]) 06/07/2015   Non compliance with medical treatment 04/17/2014   Rotator cuff tear 03/14/2014   Class 3 obesity ([place]) 09/23/2013   Chronic HFrEF (heart failure with reduced ejection fraction) ([place])  06/03/2013   Hypotension 12/25/2012   Urinary incontinence    MDD (major depressive disorder) 11/12/2010   RBBB (right bundle branch block)    Coronary artery disease    Hyperlipemia 01/22/2009   Essential hypertension 01/22/2009   Past Medical History:  Diagnosis Date   Acute MI ([place]) 1999; 2007; 03/05/21   Anemia    hx   Anginal pain (HCC)    Anxiety    ARF (acute renal failure) ([place]) 06/2017   [facility]   Arthritis    "generalized" (03/15/2014)   CAD (coronary artery disease)    MI in 2000 - MI  2007 - treated bare metal stent (no nuclear since then as 9/11)   Carotid artery disease (HCC)    CHF (congestive heart failure) ([place])    HFrEF 03/06/21   Chronic diastolic heart failure ([place])    a) ECHO (08/2013) EF 55-60% and RV function nl b) RHC (08/2013) RA 4, RV 30/5/7, PA 25/10 (16), PCWP 7, Fick CO/CI 6.3/2.7, PVR 1.5 WU, PA 61 and 66%   Daily headache    "~ every other day; since I fell in June" (03/15/2014)   Depression    Dyslipidemia    Dyspnea    uses oxygen qhs via [place]   ESRD (end stage renal disease) ([place])    Dialysis on Tues Thurs Sat   Exertional shortness of breath    History of kidney stones    HTN  (hypertension)    Hypothyroidism    Neuropathy    Obesity    Osteoarthritis    PAF (paroxysmal atrial fibrillation) (HCC)    Peripheral neuropathy    bilateral feet/hands   PONV (postoperative nausea and vomiting)    RBBB (right bundle branch block)    Old   Stroke ([place])    mini strokes   Syncope    likely due to low blood sugar   Tachycardia    Sinus tachycardia   Type II diabetes mellitus (HCC)    Type II, Lamar Sprinkles libre left upper arm. patient has omnipod insulin pump with Novolin R Insulin   Urinary incontinence    Venous insufficiency     Family History  Problem Relation Age of Onset   Heart attack Mother 65    Past Surgical History:  Procedure Laterality Date   ABDOMINAL HYSTERECTOMY  1980's   AMPUTATION Right 02/24/2018   Procedure: RIGHT FOOT GREAT TOE AND 2ND TOE AMPUTATION;  Surgeon: Newt Minion, MD;  Location: [place];  Service: Orthopedics;  Laterality: Right;   AMPUTATION Right 04/30/2018   Procedure: RIGHT TRANSMETATARSAL AMPUTATION;  Surgeon: Newt Minion, MD;  Location: [place];  Service: Orthopedics;  Laterality: Right;   AMPUTATION Right 05/02/2022   Procedure: RIGHT BELOW KNEE AMPUTATION;  Surgeon: Newt Minion, MD;  Location: [place];  Service: Orthopedics;  Laterality: Right;   APPLICATION OF WOUND VAC Right 06/13/2022   Procedure: APPLICATION OF WOUND VAC;  Surgeon: Newt Minion, MD;  Location: [place];  Service: Orthopedics;  Laterality: Right;   AV FISTULA PLACEMENT Left 04/02/2022   Procedure: LEFT ARM ARTERIOVENOUS (AV) FISTULA CREATION;  Surgeon: Serafina Mitchell, MD;  Location: [place];  Service: Vascular;  Laterality: Left;  PERIPHERAL NERVE BLOCK   BIOPSY  05/27/2020   Procedure: BIOPSY;  Surgeon: [name], DO;  [facility];  Service: Endoscopy;;   CATARACT EXTRACTION, BILATERAL Bilateral ?2013   COLONOSCOPY W/ POLYPECTOMY     COLONOSCOPY WITH PROPOFOL N/A  03/13/2019   Procedure: COLONOSCOPY WITH PROPOFOL;  Surgeon: Jerene Bears,  MD;  Location: [place];  Service: Gastroenterology;  Laterality: N/A;   CORONARY ANGIOPLASTY WITH STENT PLACEMENT  1999; 2007   "1 + 1"   ERCP N/A 02/03/2022   Procedure: ENDOSCOPIC RETROGRADE CHOLANGIOPANCREATOGRAPHY (ERCP);  Surgeon: Carol Ada, MD;  Location: [place];  Service: Gastroenterology;  Laterality: N/A;   ESOPHAGOGASTRODUODENOSCOPY (EGD) WITH PROPOFOL N/A 03/13/2019   Procedure: ESOPHAGOGASTRODUODENOSCOPY (EGD) WITH PROPOFOL;  Surgeon: Jerene Bears, MD;  Location[facility];  Service: Gastroenterology;  Laterality: N/A;   ESOPHAGOGASTRODUODENOSCOPY (EGD) WITH PROPOFOL N/A 05/27/2020   Procedure: ESOPHAGOGASTRODUODENOSCOPY (EGD) WITH PROPOFOL;  Surgeon: [name], DO;  [facility];  Service: Endoscopy;  Laterality: N/A;   EYE SURGERY Bilateral    lazer   FLEXIBLE SIGMOIDOSCOPY N/A 05/23/2022   Procedure: FLEXIBLE SIGMOIDOSCOPY;  Surgeon: Carol Ada, MD;  Location: [place];  Service: Gastroenterology;  Laterality: N/A;   FLEXIBLE SIGMOIDOSCOPY N/A 05/24/2022   Procedure: FLEXIBLE SIGMOIDOSCOPY;  Surgeon: Sharyn Creamer, MD;  Location: [place];  Service: Gastroenterology;  Laterality: N/A;   HEMOSTASIS CLIP PLACEMENT  03/13/2019   Procedure: HEMOSTASIS CLIP PLACEMENT;  Surgeon: Jerene Bears, MD;  Location: [facility]COPY;  Service: Gastroenterology;;   HEMOSTASIS CLIP PLACEMENT  05/23/2022   Procedure: HEMOSTASIS CLIP PLACEMENT;  Surgeon: Carol Ada, MD;  Location: [place];  Service: Gastroenterology;;   HEMOSTASIS CONTROL  05/24/2022   Procedure: HEMOSTASIS CONTROL;  Surgeon: Sharyn Creamer, MD;  Location: [place];  Service: Gastroenterology;;   HOT HEMOSTASIS N/A 05/23/2022   Procedure: HOT HEMOSTASIS (ARGON PLASMA COAGULATION/BICAP);  Surgeon: Carol Ada, MD;  Location: [place];  Service: Gastroenterology;  Laterality: N/A;   I & D EXTREMITY Left 05/05/2022   Procedure: IRRIGATION AND DEBRIDEMENT LEFT ARM AV  FISTULA;  Surgeon: Marty Heck, MD;  Location: [place];  Service: Vascular;  Laterality: Left;   INSERTION OF DIALYSIS CATHETER Right 04/02/2022   Procedure: INSERTION OF TUNNELED DIALYSIS CATHETER;  Surgeon: Serafina Mitchell, MD;  Location: [facility];  Service: Vascular;  Laterality: Right;   KNEE ARTHROSCOPY Left 10/25/2006   POLYPECTOMY  03/13/2019   Procedure: POLYPECTOMY;  Surgeon: Jerene Bears, MD;  Location: [place] [facility];  Service: Gastroenterology;;   REMOVAL OF STONES  02/03/2022   Procedure: REMOVAL OF STONES;  Surgeon: Carol Ada, MD;  Location: [place];  Service: Gastroenterology;;   RIGHT HEART CATH N/A 07/24/2017   Procedure: RIGHT HEART CATH;  Surgeon: Jolaine Artist, MD;  Location: [place] [facility];  Service: Cardiovascular;  Laterality: N/A;   RIGHT HEART CATHETERIZATION N/A 09/22/2013   Procedure: RIGHT HEART CATH;  Surgeon: Jolaine Artist, MD;  Location: [facility] LAB;  Service: Cardiovascular;  Laterality: N/A;   SHOULDER ARTHROSCOPY WITH OPEN ROTATOR CUFF REPAIR Right 03/14/2014   Procedure: RIGHT SHOULDER ARTHROSCOPY WITH BICEPS RELEASE, OPEN SUBSCAPULA REPAIR, OPEN SUPRASPINATUS REPAIR.;  Surgeon: Meredith Pel, MD;  Location: [place];  Service: Orthopedics;  Laterality: Right;   SPHINCTEROTOMY  02/03/2022   Procedure: SPHINCTEROTOMY;  Surgeon: Carol Ada, MD;  Location: [place];  Service: Gastroenterology;;   STUMP REVISION Right 06/13/2022   Procedure: REVISION RIGHT BELOW KNEE AMPUTATION;  Surgeon: Newt Minion, MD;  Location: [place];  Service: Orthopedics;  Laterality: Right;   TEE WITHOUT CARDIOVERSION N/A 02/04/2022   Procedure: TRANSESOPHAGEAL ECHOCARDIOGRAM (TEE);  Surgeon: Jolaine Artist, MD;  Location: [place];  Service: Cardiovascular;  Laterality: N/A;  TOE AMPUTATION Right 02/24/2018   GREAT TOE AND 2ND TOE AMPUTATION   TUBAL LIGATION  1970's   Social History   Occupational History    Employer: UNEMPLOYED  Tobacco Use    Smoking status: Former    Packs/day: 3.00    Years: 32.00    Total pack years: 96.00    Types: Cigarettes    Quit date: 10/24/1997    Years since quitting: 24.7   Smokeless tobacco: Never  Vaping Use   Vaping Use: Never used  Substance and Sexual Activity   Alcohol use: Not Currently    Comment: "might have 2-3 daiquiris in the summer"   Drug use: No   Sexual activity: Not Currently    Birth control/protection: Surgical    Comment: Hysterectomy

## 2022-07-14 DIAGNOSIS — Z4781 Encounter for orthopedic aftercare following surgical amputation: Secondary | ICD-10-CM | POA: Diagnosis not present

## 2022-07-14 DIAGNOSIS — E1142 Type 2 diabetes mellitus with diabetic polyneuropathy: Secondary | ICD-10-CM | POA: Diagnosis not present

## 2022-07-14 DIAGNOSIS — D509 Iron deficiency anemia, unspecified: Secondary | ICD-10-CM | POA: Diagnosis not present

## 2022-07-14 DIAGNOSIS — M6281 Muscle weakness (generalized): Secondary | ICD-10-CM | POA: Diagnosis not present

## 2022-07-14 DIAGNOSIS — T8189XA Other complications of procedures, not elsewhere classified, initial encounter: Secondary | ICD-10-CM | POA: Diagnosis not present

## 2022-07-14 DIAGNOSIS — L89153 Pressure ulcer of sacral region, stage 3: Secondary | ICD-10-CM | POA: Diagnosis not present

## 2022-07-14 DIAGNOSIS — Z23 Encounter for immunization: Secondary | ICD-10-CM | POA: Diagnosis not present

## 2022-07-14 DIAGNOSIS — Z992 Dependence on renal dialysis: Secondary | ICD-10-CM | POA: Diagnosis not present

## 2022-07-14 DIAGNOSIS — N2581 Secondary hyperparathyroidism of renal origin: Secondary | ICD-10-CM | POA: Diagnosis not present

## 2022-07-14 DIAGNOSIS — E1122 Type 2 diabetes mellitus with diabetic chronic kidney disease: Secondary | ICD-10-CM | POA: Diagnosis not present

## 2022-07-14 DIAGNOSIS — E1165 Type 2 diabetes mellitus with hyperglycemia: Secondary | ICD-10-CM | POA: Diagnosis not present

## 2022-07-14 DIAGNOSIS — I959 Hypotension, unspecified: Secondary | ICD-10-CM | POA: Diagnosis not present

## 2022-07-14 DIAGNOSIS — N186 End stage renal disease: Secondary | ICD-10-CM | POA: Diagnosis not present

## 2022-07-14 DIAGNOSIS — I5042 Chronic combined systolic (congestive) and diastolic (congestive) heart failure: Secondary | ICD-10-CM | POA: Diagnosis not present

## 2022-07-14 DIAGNOSIS — E8770 Fluid overload, unspecified: Secondary | ICD-10-CM | POA: Diagnosis not present

## 2022-07-14 DIAGNOSIS — J9611 Chronic respiratory failure with hypoxia: Secondary | ICD-10-CM | POA: Diagnosis not present

## 2022-07-14 DIAGNOSIS — D689 Coagulation defect, unspecified: Secondary | ICD-10-CM | POA: Diagnosis not present

## 2022-07-14 DIAGNOSIS — I10 Essential (primary) hypertension: Secondary | ICD-10-CM | POA: Diagnosis not present

## 2022-07-14 DIAGNOSIS — R2689 Other abnormalities of gait and mobility: Secondary | ICD-10-CM | POA: Diagnosis not present

## 2022-07-15 ENCOUNTER — Ambulatory Visit (INDEPENDENT_AMBULATORY_CARE_PROVIDER_SITE_OTHER): Payer: PPO | Admitting: Orthopedic Surgery

## 2022-07-15 DIAGNOSIS — T8781 Dehiscence of amputation stump: Secondary | ICD-10-CM

## 2022-07-15 DIAGNOSIS — Z89511 Acquired absence of right leg below knee: Secondary | ICD-10-CM

## 2022-07-17 DIAGNOSIS — Z992 Dependence on renal dialysis: Secondary | ICD-10-CM | POA: Diagnosis not present

## 2022-07-17 DIAGNOSIS — Z4781 Encounter for orthopedic aftercare following surgical amputation: Secondary | ICD-10-CM | POA: Diagnosis not present

## 2022-07-17 DIAGNOSIS — M6281 Muscle weakness (generalized): Secondary | ICD-10-CM | POA: Diagnosis not present

## 2022-07-17 DIAGNOSIS — R2689 Other abnormalities of gait and mobility: Secondary | ICD-10-CM | POA: Diagnosis not present

## 2022-07-17 DIAGNOSIS — J9611 Chronic respiratory failure with hypoxia: Secondary | ICD-10-CM | POA: Diagnosis not present

## 2022-07-17 DIAGNOSIS — I959 Hypotension, unspecified: Secondary | ICD-10-CM | POA: Diagnosis not present

## 2022-07-17 DIAGNOSIS — I5042 Chronic combined systolic (congestive) and diastolic (congestive) heart failure: Secondary | ICD-10-CM | POA: Diagnosis not present

## 2022-07-21 DIAGNOSIS — R2689 Other abnormalities of gait and mobility: Secondary | ICD-10-CM | POA: Diagnosis not present

## 2022-07-21 DIAGNOSIS — Z992 Dependence on renal dialysis: Secondary | ICD-10-CM | POA: Diagnosis not present

## 2022-07-21 DIAGNOSIS — T8189XA Other complications of procedures, not elsewhere classified, initial encounter: Secondary | ICD-10-CM | POA: Diagnosis not present

## 2022-07-21 DIAGNOSIS — N186 End stage renal disease: Secondary | ICD-10-CM | POA: Diagnosis not present

## 2022-07-21 DIAGNOSIS — M6281 Muscle weakness (generalized): Secondary | ICD-10-CM | POA: Diagnosis not present

## 2022-07-21 DIAGNOSIS — J9611 Chronic respiratory failure with hypoxia: Secondary | ICD-10-CM | POA: Diagnosis not present

## 2022-07-21 DIAGNOSIS — N2581 Secondary hyperparathyroidism of renal origin: Secondary | ICD-10-CM | POA: Diagnosis not present

## 2022-07-21 DIAGNOSIS — I5042 Chronic combined systolic (congestive) and diastolic (congestive) heart failure: Secondary | ICD-10-CM | POA: Diagnosis not present

## 2022-07-21 DIAGNOSIS — I959 Hypotension, unspecified: Secondary | ICD-10-CM | POA: Diagnosis not present

## 2022-07-21 DIAGNOSIS — D631 Anemia in chronic kidney disease: Secondary | ICD-10-CM | POA: Diagnosis not present

## 2022-07-21 DIAGNOSIS — D689 Coagulation defect, unspecified: Secondary | ICD-10-CM | POA: Diagnosis not present

## 2022-07-21 DIAGNOSIS — L89153 Pressure ulcer of sacral region, stage 3: Secondary | ICD-10-CM | POA: Diagnosis not present

## 2022-07-21 DIAGNOSIS — Z4781 Encounter for orthopedic aftercare following surgical amputation: Secondary | ICD-10-CM | POA: Diagnosis not present

## 2022-07-24 DIAGNOSIS — N179 Acute kidney failure, unspecified: Secondary | ICD-10-CM | POA: Diagnosis not present

## 2022-07-24 DIAGNOSIS — N186 End stage renal disease: Secondary | ICD-10-CM | POA: Diagnosis not present

## 2022-07-24 DIAGNOSIS — Z992 Dependence on renal dialysis: Secondary | ICD-10-CM | POA: Diagnosis not present

## 2022-07-25 DIAGNOSIS — D689 Coagulation defect, unspecified: Secondary | ICD-10-CM | POA: Diagnosis not present

## 2022-07-25 DIAGNOSIS — Z992 Dependence on renal dialysis: Secondary | ICD-10-CM | POA: Diagnosis not present

## 2022-07-25 DIAGNOSIS — N186 End stage renal disease: Secondary | ICD-10-CM | POA: Diagnosis not present

## 2022-07-25 DIAGNOSIS — N2581 Secondary hyperparathyroidism of renal origin: Secondary | ICD-10-CM | POA: Diagnosis not present

## 2022-07-25 DIAGNOSIS — F419 Anxiety disorder, unspecified: Secondary | ICD-10-CM | POA: Diagnosis not present

## 2022-07-25 DIAGNOSIS — E1165 Type 2 diabetes mellitus with hyperglycemia: Secondary | ICD-10-CM | POA: Diagnosis not present

## 2022-07-25 DIAGNOSIS — I10 Essential (primary) hypertension: Secondary | ICD-10-CM | POA: Diagnosis not present

## 2022-07-28 DIAGNOSIS — D689 Coagulation defect, unspecified: Secondary | ICD-10-CM | POA: Diagnosis not present

## 2022-07-28 DIAGNOSIS — N186 End stage renal disease: Secondary | ICD-10-CM | POA: Diagnosis not present

## 2022-07-28 DIAGNOSIS — M6281 Muscle weakness (generalized): Secondary | ICD-10-CM | POA: Diagnosis not present

## 2022-07-28 DIAGNOSIS — N2581 Secondary hyperparathyroidism of renal origin: Secondary | ICD-10-CM | POA: Diagnosis not present

## 2022-07-28 DIAGNOSIS — D509 Iron deficiency anemia, unspecified: Secondary | ICD-10-CM | POA: Diagnosis not present

## 2022-07-28 DIAGNOSIS — E1165 Type 2 diabetes mellitus with hyperglycemia: Secondary | ICD-10-CM | POA: Diagnosis not present

## 2022-07-28 DIAGNOSIS — Z992 Dependence on renal dialysis: Secondary | ICD-10-CM | POA: Diagnosis not present

## 2022-07-28 DIAGNOSIS — T8189XA Other complications of procedures, not elsewhere classified, initial encounter: Secondary | ICD-10-CM | POA: Diagnosis not present

## 2022-07-29 ENCOUNTER — Encounter (HOSPITAL_COMMUNITY): Payer: Self-pay | Admitting: Surgery

## 2022-07-29 ENCOUNTER — Encounter: Payer: Self-pay | Admitting: Orthopedic Surgery

## 2022-07-29 ENCOUNTER — Other Ambulatory Visit: Payer: Self-pay

## 2022-07-29 ENCOUNTER — Ambulatory Visit (INDEPENDENT_AMBULATORY_CARE_PROVIDER_SITE_OTHER): Payer: PPO | Admitting: Orthopedic Surgery

## 2022-07-29 DIAGNOSIS — Z89511 Acquired absence of right leg below knee: Secondary | ICD-10-CM

## 2022-07-29 DIAGNOSIS — T8781 Dehiscence of amputation stump: Secondary | ICD-10-CM

## 2022-07-29 NOTE — Progress Notes (Signed)
Office Visit Note   Patient: Susan [name]           Date of Birth: 01-31-1951           MRN: [id number] Visit Date: 07/15/2022              Requested by: [name], MD [address] [place],  [place] [address] PCP: [name], MD  Chief Complaint  Patient presents with   Right Leg - Routine Post Op    06/13/2022 right BKA revision      HPI: Patient is a 72 year old woman who presents 10 days status post revision right below-knee amputation.  Assessment & Plan: Visit Diagnoses:  1. Dehiscence of amputation stump ([place])   2. Right below-knee amputee [facility])     Plan: Dial soap cleansing use the stump shrinker and dry dressing  Follow-Up Instructions: Return in about 2 weeks (around 07/29/2022).   Ortho Exam  Patient is alert, oriented, no adenopathy, well-dressed, normal affect, normal respiratory effort. Examination the wound is well-approximated sutures are in place.  No cellulitis no drainage  Imaging: No results found.   Labs: Lab Results  Component Value Date   HGBA1C 8.9 (H) 05/02/2022   HGBA1C >15.5 (H) 01/20/2022   HGBA1C >15.5 (H) 01/19/2022   CRP 0.7 08/22/2020   CRP 1.5 (H) 03/13/2020   CRP 1.9 (H) 03/12/2020   LABURIC 4.7 08/09/2020   LABURIC 9.1 (H) 10/12/2018   LABURIC 9.0 (H) 03/06/2018   REPTSTATUS 06/10/2022 FINAL 06/05/2022   GRAMSTAIN  05/05/2022    FEW WBC PRESENT, PREDOMINANTLY PMN NO ORGANISMS SEEN    CULT  06/05/2022    NO GROWTH 5 DAYS Performed at [facility], [place] [address] [place], [place] [address]    LABORGA SERRATIA MARCESCENS 05/05/2022     Lab Results  Component Value Date   ALBUMIN 2.0 (L) 06/16/2022   ALBUMIN 2.2 (L) 06/14/2022   ALBUMIN 2.1 (L) 06/13/2022   PREALBUMIN 13 (L) 05/02/2022    Lab Results  Component Value Date   MG 2.5 (H) 06/06/2022   MG 1.6 (L) 05/29/2022   MG 1.7 05/28/2022   Lab Results  Component Value Date   VD25OH 42.14 05/02/2022    Lab Results  Component  Value Date   PREALBUMIN 13 (L) 05/02/2022      Latest Ref Rng & Units 06/17/2022    6:58 AM 06/16/2022    9:34 PM 06/16/2022    3:43 AM  CBC EXTENDED  WBC 4.0 - 10.5 K/uL 9.6  10.6  8.0   RBC 3.87 - 5.11 MIL/uL 2.96  3.21  2.62   Hemoglobin 12.0 - 15.0 g/dL 7.8  8.4  6.9  C  HCT 36.0 - 46.0 % 25.1  27.5  23.1   Platelets 150 - 400 K/uL 233  226  237     C Corrected result     There is no height or weight on file to calculate BMI.  Orders:  No orders of the defined types were placed in this encounter.  No orders of the defined types were placed in this encounter.    Procedures: No procedures performed  Clinical Data: No additional findings.  ROS:  All other systems negative, except as noted in the HPI. Review of Systems  Objective: Vital Signs: There were no vitals taken for this visit.  Specialty Comments:  No specialty comments available.  PMFS History: Patient Active Problem List   Diagnosis Date  Noted   Constipation 06/07/2022   History of Clostridioides difficile colitis 06/06/2022   Below-knee amputation of right lower extremity ([place]) 06/06/2022   Diverticulitis 06/05/2022   Stercoral colitis 06/05/2022   C. difficile colitis 06/05/2022   Spleen hematoma 06/05/2022   Dehiscence of amputation stump of right lower extremity ([place]) 06/05/2022   Rectal ulcer 05/27/2022   ESRD on dialysis ([place]) 05/27/2022   GI bleed 05/23/2022   Difficult intravenous access 05/23/2022   Gangrene of right foot ([place]) 05/02/2022   S/P BKA (below knee amputation) unilateral, right ([place]) 05/02/2022   Unspecified protein-calorie malnutrition ([place]) 04/15/2022   Secondary hyperparathyroidism of renal origin ([place]) 04/14/2022   Coagulation defect, unspecified ([place]) 04/09/2022   Acquired absence of other left toe(s) ([place]) 04/07/2022   Allergy, unspecified, initial encounter 04/07/2022   Dependence on renal dialysis ([place]) 04/07/2022   Gout due to renal impairment, unspecified site  04/07/2022   Hypertensive heart and chronic kidney disease with heart failure and with stage 5 chronic kidney disease, or end stage renal disease ([place]) 04/07/2022   Personal history of transient ischemic attack (TIA), and cerebral infarction without residual deficits 04/07/2022   Renal osteodystrophy 04/07/2022   Venous stasis ulcer of right calf ([place]) 03/31/2022   Fistula, colovaginal 03/26/2022   Diarrhea 03/26/2022   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   Sepsis without acute organ dysfunction ([place])    Bacteremia    Acute pancreatitis 02/01/2022   Abdominal pain 02/01/2022   SIRS (systemic inflammatory response syndrome) ([place]) 02/01/2022   Transaminitis 02/01/2022   History of anemia due to chronic kidney disease 02/01/2022   Paroxysmal atrial fibrillation ([place]) 02/01/2022   DKA (diabetic ketoacidosis) ([place]) 01/14/2022   NSTEMI (non-ST elevated myocardial infarction) ([place]) 03/05/2021   Acute renal failure superimposed on stage 4 chronic kidney disease ([place]) 08/22/2020   Hypoalbuminemia 05/25/2020   GERD (gastroesophageal reflux disease) 05/25/2020   Pressure injury of skin 05/17/2020   Type 2 diabetes mellitus with diabetic polyneuropathy, with long-term current use of insulin ([place]) 03/07/2020   Obesity, Class III, BMI 40-49.9 (morbid obesity) ([place]) 03/07/2020   Common bile duct (CBD) obstruction 05/28/2019   Benign neoplasm of ascending colon    Benign neoplasm of transverse colon    Benign neoplasm of descending colon    Benign neoplasm of sigmoid colon    Gastric polyps    Hyperkalemia 03/11/2019   Prolonged QT interval 03/11/2019   Acute blood loss anemia 03/11/2019   Onychomycosis 06/21/2018   Osteomyelitis of second toe of right foot ([place])    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 (HCC)    PVD (peripheral vascular disease) ([place]) 10/26/2017   E-coli UTI 07/27/2017   Hypothyroidism 07/27/2017   AKI (acute kidney injury) ([place])    [place] (pulmonary artery  hypertension) ([place])    Impaired ambulation 07/19/2017   Nausea & vomiting 07/15/2017   Leg cramps 02/27/2017   Peripheral edema 01/12/2017   Diabetic neuropathy ([place]) 11/12/2016   CKD (chronic kidney disease), stage IV ([place]) 10/24/2015   Anemia of chronic disease 10/03/2015   Generalized anxiety disorder 10/03/2015   Insomnia 10/03/2015   Hyperglycemia due to diabetes mellitus ([place]) 06/07/2015   Chronic diastolic CHF (congestive heart failure) ([place]) 06/07/2015   Non compliance with medical treatment 04/17/2014   Rotator cuff tear 03/14/2014   Class 3 obesity ([place]) 09/23/2013   Chronic HFrEF (heart failure with reduced ejection fraction) ([place]) 06/03/2013   Hypotension 12/25/2012   Urinary incontinence    MDD (major depressive  disorder) 11/12/2010   RBBB (right bundle branch block)    Coronary artery disease    Hyperlipemia 01/22/2009   Essential hypertension 01/22/2009   Past Medical History:  Diagnosis Date   Acute MI ([place]) 1999; 2007; 03/05/21   Anemia    hx   Anginal pain (HCC)    Anxiety    ARF (acute renal failure) ([place]) 06/2017   [facility]   Arthritis    "generalized" (03/15/2014)   CAD (coronary artery disease)    MI in 2000 - MI  2007 - treated bare metal stent (no nuclear since then as 9/11)   Carotid artery disease (HCC)    CHF (congestive heart failure) ([place])    HFrEF 03/06/21   Chronic diastolic heart failure ([place])    a) ECHO (08/2013) EF 55-60% and RV function nl b) RHC (08/2013) RA 4, RV 30/5/7, PA 25/10 (16), PCWP 7, Fick CO/CI 6.3/2.7, PVR 1.5 WU, PA 61 and 66%   Daily headache    "~ every other day; since I fell in June" (03/15/2014)   Depression    Dyslipidemia    Dyspnea    uses oxygen qhs via [place]   ESRD (end stage renal disease) ([place])    Dialysis on Tues Thurs Sat   Exertional shortness of breath    History of kidney stones    HTN (hypertension)    Hypothyroidism    Neuropathy    Obesity    Osteoarthritis    PAF (paroxysmal atrial  fibrillation) (HCC)    Peripheral neuropathy    bilateral feet/hands   PONV (postoperative nausea and vomiting)    RBBB (right bundle branch block)    Old   Stroke ([place])    mini strokes   Syncope    likely due to low blood sugar   Tachycardia    Sinus tachycardia   Type II diabetes mellitus (HCC)    Type II, Lamar Sprinkles libre left upper arm. patient has omnipod insulin pump with Novolin R Insulin   Urinary incontinence    Venous insufficiency     Family History  Problem Relation Age of Onset   Heart attack Mother 43    Past Surgical History:  Procedure Laterality Date   ABDOMINAL HYSTERECTOMY  1980's   AMPUTATION Right 02/24/2018   Procedure: RIGHT FOOT GREAT TOE AND 2ND TOE AMPUTATION;  Surgeon: Newt Minion, MD;  Location: [facility];  Service: Orthopedics;  Laterality: Right;   AMPUTATION Right 04/30/2018   Procedure: RIGHT TRANSMETATARSAL AMPUTATION;  Surgeon: Newt Minion, MD;  Location: [facility];  Service: Orthopedics;  Laterality: Right;   AMPUTATION Right 05/02/2022   Procedure: RIGHT BELOW KNEE AMPUTATION;  Surgeon: Newt Minion, MD;  Location: [facility];  Service: Orthopedics;  Laterality: Right;   APPLICATION OF WOUND VAC Right 06/13/2022   Procedure: APPLICATION OF WOUND VAC;  Surgeon: Newt Minion, MD;  Location: [facility];  Service: Orthopedics;  Laterality: Right;   AV FISTULA PLACEMENT Left 04/02/2022   Procedure: LEFT ARM ARTERIOVENOUS (AV) FISTULA CREATION;  Surgeon: Serafina Mitchell, MD;  Location: [facility];  Service: Vascular;  Laterality: Left;  PERIPHERAL NERVE BLOCK   BIOPSY  05/27/2020   Procedure: BIOPSY;  Surgeon: [name], DO;  [facility];  Service: Endoscopy;;   CATARACT EXTRACTION, BILATERAL Bilateral ?2013   COLONOSCOPY W/ POLYPECTOMY     COLONOSCOPY WITH PROPOFOL N/A 03/13/2019   Procedure: COLONOSCOPY WITH PROPOFOL;  Surgeon: Jerene Bears, MD;  Location:  [facility];  Service: Gastroenterology;  Laterality: N/A;   CORONARY ANGIOPLASTY WITH  STENT PLACEMENT  1999; 2007   "1 + 1"   ERCP N/A 02/03/2022   Procedure: ENDOSCOPIC RETROGRADE CHOLANGIOPANCREATOGRAPHY (ERCP);  Surgeon: Carol Ada, MD;  Location: [facility];  Service: Gastroenterology;  Laterality: N/A;   ESOPHAGOGASTRODUODENOSCOPY (EGD) WITH PROPOFOL N/A 03/13/2019   Procedure: ESOPHAGOGASTRODUODENOSCOPY (EGD) WITH PROPOFOL;  Surgeon: Jerene Bears, MD;  Location: [facility] ENDOSCOPY;  Service: Gastroenterology;  Laterality: N/A;   ESOPHAGOGASTRODUODENOSCOPY (EGD) WITH PROPOFOL N/A 05/27/2020   Procedure: ESOPHAGOGASTRODUODENOSCOPY (EGD) WITH PROPOFOL;  Surgeon: [name], DO;  [facility];  Service: Endoscopy;  Laterality: N/A;   EYE SURGERY Bilateral    lazer   FLEXIBLE SIGMOIDOSCOPY N/A 05/23/2022   Procedure: FLEXIBLE SIGMOIDOSCOPY;  Surgeon: Carol Ada, MD;  Location: [facility];  Service: Gastroenterology;  Laterality: N/A;   FLEXIBLE SIGMOIDOSCOPY N/A 05/24/2022   Procedure: FLEXIBLE SIGMOIDOSCOPY;  Surgeon: Sharyn Creamer, MD;  Location: [facility];  Service: Gastroenterology;  Laterality: N/A;   HEMOSTASIS CLIP PLACEMENT  03/13/2019   Procedure: HEMOSTASIS CLIP PLACEMENT;  Surgeon: Jerene Bears, MD;  Location: [facility] ENDOSCOPY;  Service: Gastroenterology;;   HEMOSTASIS CLIP PLACEMENT  05/23/2022   Procedure: HEMOSTASIS CLIP PLACEMENT;  Surgeon: Carol Ada, MD;  Location: [facility];  Service: Gastroenterology;;   HEMOSTASIS CONTROL  05/24/2022   Procedure: HEMOSTASIS CONTROL;  Surgeon: Sharyn Creamer, MD;  Location: [facility];  Service: Gastroenterology;;   HOT HEMOSTASIS N/A 05/23/2022   Procedure: HOT HEMOSTASIS (ARGON PLASMA COAGULATION/BICAP);  Surgeon: Carol Ada, MD;  Location: [facility];  Service: Gastroenterology;  Laterality: N/A;   I & D EXTREMITY Left 05/05/2022   Procedure: IRRIGATION AND DEBRIDEMENT LEFT ARM AV FISTULA;  Surgeon: Marty Heck, MD;  Location: [facility];  Service: Vascular;  Laterality: Left;    INSERTION OF DIALYSIS CATHETER Right 04/02/2022   Procedure: INSERTION OF TUNNELED DIALYSIS CATHETER;  Surgeon: Serafina Mitchell, MD;  Location: [facility] OR;  Service: Vascular;  Laterality: Right;   KNEE ARTHROSCOPY Left 10/25/2006   POLYPECTOMY  03/13/2019   Procedure: POLYPECTOMY;  Surgeon: Jerene Bears, MD;  Location: [facility];  Service: Gastroenterology;;   REMOVAL OF STONES  02/03/2022   Procedure: REMOVAL OF STONES;  Surgeon: Carol Ada, MD;  Location: [facility];  Service: Gastroenterology;;   RIGHT HEART CATH N/A 07/24/2017   Procedure: RIGHT HEART CATH;  Surgeon: Jolaine Artist, MD;  Location: [facility];  Service: Cardiovascular;  Laterality: N/A;   RIGHT HEART CATHETERIZATION N/A 09/22/2013   Procedure: RIGHT HEART CATH;  Surgeon: Jolaine Artist, MD;  Location: [facility] CATH LAB;  Service: Cardiovascular;  Laterality: N/A;   SHOULDER ARTHROSCOPY WITH OPEN ROTATOR CUFF REPAIR Right 03/14/2014   Procedure: RIGHT SHOULDER ARTHROSCOPY WITH BICEPS RELEASE, OPEN SUBSCAPULA REPAIR, OPEN SUPRASPINATUS REPAIR.;  Surgeon: Meredith Pel, MD;  Location: [facility];  Service: Orthopedics;  Laterality: Right;   SPHINCTEROTOMY  02/03/2022   Procedure: SPHINCTEROTOMY;  Surgeon: Carol Ada, MD;  Location: [facility];  Service: Gastroenterology;;   STUMP REVISION Right 06/13/2022   Procedure: REVISION RIGHT BELOW KNEE AMPUTATION;  Surgeon: Newt Minion, MD;  Location: [facility];  Service: Orthopedics;  Laterality: Right;   TEE WITHOUT CARDIOVERSION N/A 02/04/2022   Procedure: TRANSESOPHAGEAL ECHOCARDIOGRAM (TEE);  Surgeon: Jolaine Artist, MD;  Location: [facility];  Service: Cardiovascular;  Laterality: N/A;   TOE AMPUTATION Right 02/24/2018   GREAT TOE AND 2ND TOE AMPUTATION   TUBAL  LIGATION  1970's   Social History   Occupational History    Employer: UNEMPLOYED  Tobacco Use   Smoking status: Former    Packs/day: 3.00    Years: 32.00    Total pack years: 96.00    Types:  Cigarettes    Quit date: 10/24/1997    Years since quitting: 24.7   Smokeless tobacco: Never  Vaping Use   Vaping Use: Never used  Substance and Sexual Activity   Alcohol use: Not Currently    Comment: "might have 2-3 daiquiris in the summer"   Drug use: No   Sexual activity: Not Currently    Birth control/protection: Surgical    Comment: Hysterectomy

## 2022-07-29 NOTE — Progress Notes (Signed)
Able to review SDW with Laurell Josephs, Nurse at [facility]. Aware she is to call the pharmacy calll center at [phone] to verify med list. Nurse is aware to send a copy of the [facility] with the patient DOS to include last dose given.   Informed patient does NOT ambulate and requires a hoyer lift. Patient also has an open non-healing sacral wound.

## 2022-07-29 NOTE — Progress Notes (Addendum)
Patient resides at [facility].  [phone].  Patient does not ambulate and requires a [employer] fast streamer.  Patient also has an open non-healing sacral wound.   PCP - Dr. Cletus Gash T. [name] Cardiologist - Dr. Glori Bickers Nephrology: Dr. Hollie Salk Orthopedist: Dr. Meridee Score  PPM/ICD - Denies  Chest x-ray - 06/14/2022 EKG - 06/14/2022 Stress Test -01/29/2017 ECHO - 02/02/2022 Cardiac Cath - 07/24/2017  Diagnosed with sleep apnea,  wear oxygen 2L/[place] at night  Type II diabetic Fasting Blood Sugar - 180-200 Checks Blood Sugar Four times per day.   Tradjenta, hold day of surgery Insulin glargine (Lantus) take 12.5 units night before surgery.   Insulin Aspart (Novolog Flexpen):  Do NOT take at bedtime the night before surgery or the morning of surgery.  If blood sugar >220 can use 1/2 sliding scale dose.   Blood Thinner Instructions: Stop Eliquis 3 days prior to surgery. Last dose 07/27/2022 Aspirin Instructions: Stop 3 days prior to surgery. Last dose 07/27/2022  ERAS Protcol - No, NPO  COVID TEST- Negative last Friday.   Anesthesia review: Yes, extensive medical history with DM, HTN, ESRD, MI, [place], CHF  Patient verbally denies any shortness of breath, fever, cough and chest pain during phone call   -------------  SDW INSTRUCTIONS given:  Your procedure is scheduled on Thursday, July 31, 2022  Report to [facility] Main Entrance "A" at 0530 A.M., and check in at the Admitting office.  Call this number if you have problems the morning of surgery:  [phone]   Remember:  Do not eat or drink after midnight the night before your surgery   Take these medicines the morning of surgery with A SIP OF WATER  Amiodarone, Levothyroxine, Protonix, Lyrica, Trintellix  As needed: Albuterol, Hydrolazine (SPB>150), Zofran, Percocert   Please stop Aspirin and Eliquis three days prior to surgery.  Last dose 07/27/2022.    As of today, STOP taking any Aleve, Naproxen, Ibuprofen,  Motrin, Advil, Goody's, BC's, all herbal medications, fish oil, and all vitamins.    WHAT DO I DO ABOUT MY DIABETES MEDICATION?  Hold Ozempic 7 days prior to surgery.    Do not take oral diabetes medicines (pills) the morning of surgery.  Do NOT take Linagliptin (Tradjenta) the day of surgery  THE NIGHT BEFORE SURGERY, take 12.5 units of Insulin glargine (Lantus).    Do NOT take Insulin Aspart (Novolog Flexpen) the evening before or the morning of surgery     The day of surgery, do not take other diabetes injectables, including Byetta (exenatide), Bydureon (exenatide ER), Victoza (liraglutide), or Trulicity (dulaglutide).  If your CBG is greater than 220 mg/dL, you may take  of your sliding scale (correction) dose of insulin, (Novolog)   HOW TO MANAGE YOUR DIABETES BEFORE AND AFTER SURGERY  Why is it important to control my blood sugar before and after surgery? Improving blood sugar levels before and after surgery helps healing and can limit problems. A way of improving blood sugar control is eating a healthy diet by:  Eating less sugar and carbohydrates  Increasing activity/exercise  Talking with your doctor about reaching your blood sugar goals High blood sugars (greater than 180 mg/dL) can raise your risk of infections and slow your recovery, so you will need to focus on controlling your diabetes during the weeks before surgery. Make sure that the doctor who takes care of your diabetes knows about your planned surgery including the date and location.  How do I manage my  blood sugar before surgery? Check your blood sugar at least 4 times a day, starting 2 days before surgery, to make sure that the level is not too high or low.  Check your blood sugar the morning of your surgery when you wake up and every 2 hours until you get to the Short Stay unit.  If your blood sugar is less than 70 mg/dL, you will need to treat for low blood sugar: Do not take insulin. Treat a low blood  sugar (less than 70 mg/dL) with  cup of clear juice (cranberry or apple), 4 glucose tablets, OR glucose gel. Recheck blood sugar in 15 minutes after treatment (to make sure it is greater than 70 mg/dL). If your blood sugar is not greater than 70 mg/dL on recheck, call [phone] for further instructions. Report your blood sugar to the short stay nurse when you get to Short Stay.  If you are admitted to the hospital after surgery: Your blood sugar will be checked by the staff and you will probably be given insulin after surgery (instead of oral diabetes medicines) to make sure you have good blood sugar levels. The goal for blood sugar control after surgery is 80-180 mg/dL.                       Do not wear jewelry, make up, or nail polish            Do not wear lotions, powders, perfumes or deodorant.            Do not shave 48 hours prior to surgery.              Do not bring valuables to the hospital.             [facility] is not responsible for any belongings or valuables.  Do NOT Smoke (Tobacco/Vaping) 24 hours prior to your procedure  If you use a CPAP at night, you may bring all equipment for your overnight stay.   Contacts, glasses, dentures or bridgework may not be worn into surgery.      For patients admitted to the hospital, discharge time will be determined by your treatment team.   Patients discharged the day of surgery will not be allowed to drive home, and someone needs to stay with them for 24 hours.    Special instructions:   [facility]- Preparing For Surgery  Before surgery, you can play an important role. Because skin is not sterile, your skin needs to be as free of germs as possible. You can reduce the number of germs on your skin by washing with Dial Soap before surgery.    Oral Hygiene is also important to reduce your risk of infection.  Remember - BRUSH YOUR TEETH THE MORNING OF SURGERY WITH YOUR REGULAR TOOTHPASTE   Please follow these instructions  carefully.   Shower the NIGHT BEFORE SURGERY and the MORNING OF SURGERY with DIAL Soap.   Pat yourself dry with a CLEAN TOWEL.  Wear CLEAN PAJAMAS to bed the night before surgery  Place CLEAN SHEETS on your bed the night of your first shower and DO NOT SLEEP WITH PETS.   Day of Surgery: Please shower morning of surgery  Wear Clean/Comfortable clothing the morning of surgery Do not apply any deodorants/lotions.   Remember to brush your teeth WITH YOUR REGULAR TOOTHPASTE.   Questions were answered. Patient verbalized understanding of instructions.

## 2022-07-29 NOTE — Progress Notes (Signed)
Attempted to call [facility] to review SDW information.  Nurse Laurell Josephs is unavailable to review information at this time. Will try calling back with in the hour.

## 2022-07-30 NOTE — Anesthesia Preprocedure Evaluation (Signed)
Anesthesia Evaluation  Patient identified by MRN, date of birth, ID band Patient awake    Reviewed: Allergy & Precautions, NPO status , Patient's Chart, lab work & pertinent test results  History of Anesthesia Complications (+) PONV and history of anesthetic complications  Airway Mallampati: II  TM Distance: >3 FB Neck ROM: Full    Dental   Pulmonary shortness of breath, former smoker   breath sounds clear to auscultation       Cardiovascular hypertension, + angina  + CAD, + Past MI, + Cardiac Stents, + Peripheral Vascular Disease and +CHF  + dysrhythmias Atrial Fibrillation  Rhythm:Regular Rate:Normal     Neuro/Psych  Headaches  Neuromuscular disease CVA    GI/Hepatic Neg liver ROS, PUD,GERD  Medicated,,  Endo/Other  diabetesHypothyroidism    Renal/GU ESRF and DialysisRenal disease     Musculoskeletal  (+) Arthritis ,    Abdominal   Peds  Hematology  (+) Blood dyscrasia, anemia   Anesthesia Other Findings   Reproductive/Obstetrics                             Anesthesia Physical Anesthesia Plan  ASA: 4  Anesthesia Plan: General   Post-op Pain Management: Tylenol PO (pre-op)*   Induction: Intravenous  PONV Risk Score and Plan: 4 or greater and Dexamethasone, Ondansetron and Treatment may vary due to age or medical condition  Airway Management Planned: Oral ETT  Additional Equipment:   Intra-op Plan:   Post-operative Plan: Extubation in OR  Informed Consent: I have reviewed the patients History and Physical, chart, labs and discussed the procedure including the risks, benefits and alternatives for the proposed anesthesia with the patient or authorized representative who has indicated his/her understanding and acceptance.     Dental advisory given  Plan Discussed with: CRNA and Surgeon  Anesthesia Plan Comments: (  )       Anesthesia Quick Evaluation

## 2022-07-30 NOTE — Progress Notes (Signed)
Clarification from [place], nurse at [facility].  Patient has not received any dosage of Ozempic. It was scheduled for this coming Sat, but she does not qualify for this medication so it was discontinued by the provider.  MAR updated.

## 2022-07-30 NOTE — Progress Notes (Signed)
Anesthesia Chart Review: Susan [name]  Case: [id number] Date/Time: 07/31/22 0715   Procedure: LEFT ARM SECOND STAGE BASILIC VEIN TRANSPOSITION (Left)   Anesthesia type: Choice   Pre-op diagnosis: ESRD   Location: [facility] OR ROOM 12 / [place] [facility]   Surgeons: Serafina Mitchell, MD       DISCUSSION:  Patient is a 72 year old female scheduled for the above procedure.  S/p right IJ TDC and first stage left basilic vein transposition AVF 04/02/22. S/p I&D of left arm infected seroma 05/05/22, + Serratia, s/p antibiotics.    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 difficile colitis (+ 02/18/22; completed therapy 04/06/22), recurrent UTI, bleeding rectal ulcer (s/p bipolar cautery, MR conditional clips 05/23/22, clip in rectum, Purstat and hemostatic spray->over-sewing of rectal ulcer 05/24/22), gallstone pancreatitis (with Enterococcus bacteremia 01/31/22, s/p ERCP, biliary sphincterotomy, balloon extraction 02/03/22). On 03/27/22 CT scan there was concern for colovesical fistula--referred to urology for out-patient cystogram. On 06/12/22 CT there was concern for perisplenic collection, but general surgery felt possible spontaneous subcapsular hematoma and anticoagulation held, low suspicion for infectious collection given no gas with out-patient follow-up planned.     Last cardiology out-patient visit noted is from 02/25/22 with [name], [place] [facility].  She was volume overloaded at  that time and torsemide and metolazone increased. Rare angina (stable), on Imdur and b-blocker. No plans for LHC at that time unless ACS or persistent severe angina. She was not yet on hemodialysis.  On amiodarone and Eliquis for PAF. During her 03/30/22-04/06/22 admission, nephrology did communicate with HF cardiologist Dr. Loralie Champagne to discuss if she would benefit from inotropic therapy, but he did not believe they would help and she was not a candidate for home inotropes. Ultimately, she was required initiation of hemodialysis ~ 04/02/22 to help with volume overload.     Diabetes medications listed on current MAR include Ozempic 0.25 mg weekly, Tradjenta 5 mg daily, Lantus 25 units Q HS, Novology 100 unit/mL SSI. However, staff at SNF said that patient never started Ozempic because she was "not eligible" for it.  She has a FreeStyle Libre glucose monitor.   She resides at [facility]. SNF Nursing staff reported that she does not ambulate and requires a Hoyer lift and that she also has a sacral wound.  Last Eliquis reported as 07/27/2022. As above, staff said she never started Ozempic.   Anesthesia team to evaluate on the day of surgery.     VS: Ht '5\' 5"'$  (1.651 m)   Wt 96.6 kg   BMI 35.45 kg/m  BP Readings from Last 3 Encounters:  07/03/22 138/77  06/17/22 (!) 134/58  05/29/22 (!) 122/110   Pulse Readings from Last 3 Encounters:  07/03/22 76  06/17/22 84  05/29/22 (!) 102     PROVIDERS: [name], MD is PCP  Madelon Lips, MD is nephrologist Glori Bickers, MD is HF cardiologist Jacelyn Pi, MD is endocrinologist Rosiland Oz, MD is ID Meridee Score, MD is orthopedic surgeon   LABS: For day of  surgery. Last results in [facility] include: Lab Results  Component Value Date   WBC 9.6 06/17/2022   HGB 7.8 (L) 06/17/2022   HCT 25.1 (L) 06/17/2022   PLT 233 06/17/2022   GLUCOSE 137 (H) 06/16/2022   ALT 11 06/05/2022   AST 11 (L) 06/05/2022   NA  128 (L) 06/16/2022   K 4.3 06/16/2022   CL 96 (L) 06/16/2022   CREATININE 2.93 (H) 06/16/2022   BUN 64 (H) 06/16/2022   CO2 26 06/16/2022   TSH 1.493 02/01/2022   INR 1.4 (H) 05/22/2022   HGBA1C 8.9 (H) 05/02/2022    Sleep Study 07/25/2020: FINDINGS: 1. No evidence of Obstructive Sleep Apnea with AHI 2.8hr.  2. No Central Sleep Apnea. 3. Oxygen desaturations as low as 86%. 4. Mild snoring was present. O2 sats were < 88% for 0.37mnutes. 5. Total sleep time was 7 hrs and 24 min. 6. 32.8% of total sleep time was spent in REM sleep. 7. Normal sleep onset latency at 20 min.  8. Shortened REM sleep onset latency at 25 min.  9. Total awakenings were 3.      IMAGES: 1V PCXR 06/14/22: FINDINGS: Mild left basilar atelectasis. Lungs are otherwise clear. No pneumothorax or pleural effusion. Right internal jugular hemodialysis catheter tip noted at the superior cavoatrial junction. Cardiac size within normal limits. Pulmonary vascularity is normal. No acute bone abnormality. IMPRESSION: 1. Mild left basilar atelectasis.   CT Chest 06/12/2022:   IMPRESSION: 1. No significant change in small left pleural effusion with adjacent compressive left lower lobe atelectasis. Mildly increased right basilar subsegmental atelectasis. 2. Enlarging hypodense lesion involving the medial aspect of the spleen with a probable complex fluid collection extending along the anteromedial aspect of the spleen. This could reflect a splenic abscess or subacute infarct with adjacent hematoma. No air within this collection or surrounding inflammatory changes. Consider further evaluation with ultrasound or follow-up abdominal CT with contrast. 3. Sequela of prior granulomatous disease with unchanged, nonspecific mildly enlarged precarinal lymph node. 4.  Aortic Atherosclerosis (ICD10-I70.0). - Per 06/17/22 Discharge Summary: "CT abdomen/pelvis with contrast noted 6 x 4 x 2 cm complex subcapsular collection  superior aspect of the spleen with concern for hematoma versus infectious process.  Seen by general surgery, believe possible spontaneous subcapsular hematoma.  Has been off of anticoagulation for greater than 2 weeks.  Low suspicion for infectious collection given no gas.  Transfuse 1 unit PRBCs during hospitalization, hemoglobin stable 8.4 at time of discharge.  General surgery now signed off.  Recommend outpatient follow-up with general surgery with repeat CT Abdo/pelvis with IV contrast for interval surveillance."   EKG: 06/14/2022: Sinus rhythm with Fusion complexes Left axis deviation Right bundle branch block Inferior infarct , age undetermined Anteroseptal infarct , age undetermined Abnormal ECG No previous ECGs available since last tracing no significant change Confirmed by VLarae Grooms([phone] on 06/14/2022 10:03:16 PM     CV: TEE 02/04/2022: IMPRESSIONS   1. Left ventricular ejection fraction, by estimation, is 30%. The left  ventricle has moderately decreased function.   2. Right ventricular systolic function is moderately reduced. The right  ventricular size is moderately enlarged.   3. Left atrial size was moderately dilated. No left atrial/left atrial  appendage thrombus was detected.   4. Right atrial size was severely dilated.   5. No vegetation. The mitral valve is normal in structure. Trivial mitral  valve regurgitation.   6. No vegetation. Tricuspid valve regurgitation is severe.   7. There is  a small filamentous structure on the aortic side of the valve  that is likely a Lambl's excresence. No vegetation. The aortic valve is  tricuspid. There is mild calcification of the aortic valve. Aortic valve  regurgitation is not visualized.   8. No vegetation.   9. There is Moderate (Grade III) plaque involving the descending aorta.      TTE 02/02/2022: IMPRESSIONS   1. Left ventricular ejection fraction, by estimation, is 30%. The left  ventricle has moderately  decreased function. The left ventricle  demonstrates regional wall motion abnormalities (see scoring  diagram/findings for description). The left ventricular  internal cavity size was moderately dilated. Left ventricular diastolic  parameters are indeterminate. There is the interventricular septum is  flattened in diastole ('D' shaped left ventricle), consistent with right  ventricular volume overload.   2. Right ventricular systolic function is low normal. The right  ventricular size is normal. There is mildly elevated pulmonary artery  systolic pressure. The estimated right ventricular systolic pressure is  [id number] mmHg.   3. The mitral valve is abnormal. Trivial mitral valve regurgitation.  Moderate mitral annular calcification.   4. Tricuspid valve regurgitation is severe.   5. The aortic valve is tricuspid. There is moderate calcification of the  aortic valve. There is mild thickening of the aortic valve. Aortic valve  regurgitation is not visualized.  - Comparison(s): Prior images reviewed side by side. Compared to prior;  aortic valve is more thickened and calcified (2022 full study), LV has  further dilatied, TR is severe.  - Previously 01/17/22 LVEF 30-35%; 12/10/21 LVEF 25-30%, LV akinesis basal-mid inferior wall & hypokinesis basal-mid inferoseptal wall; 03/06/21: LVEF 30-35%,; 02/08/20 LVEF 55-60%      Right heart cath 07/24/2017: Findings: RA = 17 RV = 48/15 PA = 49/11 (30) PCW = 18 Fick cardiac output/index = 7.0/3.0 PVR = 0.5 WU Ao sat = 97% PA sat = 62%, 64%   Assessment: 1. Normal left-sided pressures 2. Mild PAH with evidence of RV strain likely due to OHS/OSA 3. Normal cardiac output     Myocardial perfusion imaging 01/29/2017: Nuclear stress EF: 68%. Small defect in apical region consistent with probable soft tissue attenuation. (breast) No significant ischemia or scar. Low risk study     LHC 01/05/2006:  Coronaries of the left main was normal. The LAD was  small and did not wrap the apex. There was mid-30% stenosis. There was apical 25% stenosis. First diagonal was large with mid 25-30% stenosis. The circumflex and the AV groove was normal. There was a mid-obtuse marginal which was large and branching which had proximal 30% stenosis. Posterolateral was small and normal. The right coronary artery was dominant. There was proximal long 25% stenosis. There were two stents in the mid-segment. The first stent had long diffuse 25-30% stenosis. The second stent had in-stent mild luminal irregularities. The remainder of the vessel was free of high-grade disease including moderate-sized PDA.  LEFT VENTRICULOGRAM: The left ventriculogram was obtained in the RAO projection. The EF was 60% with normal wall motion. Occlusion residual nonobstructive coronary artery disease, well-preserved ejection fraction.      Carotid duplex 04/27/2012:  Mild atherosclerotic disease in the carotid arteries bilaterally. Estimated degree of stenosis in the internal carotid arteries is less than 50% bilaterally.     Past Medical History:  Diagnosis Date   Acute MI ([place]) 1999; 2007; 03/05/21   Anemia    hx   Anginal pain (HCC)    Anxiety  ARF (acute renal failure) ([place]) 06/2017   [facility]   Arthritis    "generalized" (03/15/2014)   CAD (coronary artery disease)    MI in 2000 - MI  2007 - treated bare metal stent (no nuclear since then as 9/11)   Carotid artery disease (HCC)    CHF (congestive heart failure) ([place])    HFrEF 03/06/21   Chronic diastolic heart failure ([place])    a) ECHO (08/2013) EF 55-60% and RV function nl b) RHC (08/2013) RA 4, RV 30/5/7, PA 25/10 (16), PCWP 7, Fick CO/CI 6.3/2.7, PVR 1.5 WU, PA 61 and 66%   Daily headache    "~ every other day; since I fell in June" (03/15/2014)   Depression    Dyslipidemia    Dyspnea    uses oxygen qhs via    ESRD (end stage renal disease) ([place])    Dialysis on Tues Thurs Sat   Exertional shortness of  breath    History of kidney stones    HTN (hypertension)    Hypothyroidism    Neuropathy    Obesity    Osteoarthritis    PAF (paroxysmal atrial fibrillation) (HCC)    Peripheral neuropathy    bilateral feet/hands   PONV (postoperative nausea and vomiting)    RBBB (right bundle branch block)    Old   Stroke ([place])    mini strokes   Syncope    likely due to low blood sugar   Tachycardia    Sinus tachycardia   Type II diabetes mellitus (HCC)    Type II, Lamar Sprinkles libre left upper arm. patient has omnipod insulin pump with Novolin R Insulin   Urinary incontinence    Venous insufficiency     Past Surgical History:  Procedure Laterality Date   ABDOMINAL HYSTERECTOMY  1980's   AMPUTATION Right 02/24/2018   Procedure: RIGHT FOOT GREAT TOE AND 2ND TOE AMPUTATION;  Surgeon: Newt Minion, MD;  Location: [place];  Service: Orthopedics;  Laterality: Right;   AMPUTATION Right 04/30/2018   Procedure: RIGHT TRANSMETATARSAL AMPUTATION;  Surgeon: Newt Minion, MD;  Location: [place];  Service: Orthopedics;  Laterality: Right;   AMPUTATION Right 05/02/2022   Procedure: RIGHT BELOW KNEE AMPUTATION;  Surgeon: Newt Minion, MD;  Location: [place];  Service: Orthopedics;  Laterality: Right;   APPLICATION OF WOUND VAC Right 06/13/2022   Procedure: APPLICATION OF WOUND VAC;  Surgeon: Newt Minion, MD;  Location: [place];  Service: Orthopedics;  Laterality: Right;   AV FISTULA PLACEMENT Left 04/02/2022   Procedure: LEFT ARM ARTERIOVENOUS (AV) FISTULA CREATION;  Surgeon: Serafina Mitchell, MD;  Location: [place];  Service: Vascular;  Laterality: Left;  PERIPHERAL NERVE BLOCK   BIOPSY  05/27/2020   Procedure: BIOPSY;  Surgeon: [name], DO;  [facility];  Service: Endoscopy;;   CATARACT EXTRACTION, BILATERAL Bilateral ?2013   COLONOSCOPY W/ POLYPECTOMY     COLONOSCOPY WITH PROPOFOL N/A 03/13/2019   Procedure: COLONOSCOPY WITH PROPOFOL;  Surgeon: Jerene Bears, MD;  Location: [place];   Service: Gastroenterology;  Laterality: N/A;   CORONARY ANGIOPLASTY WITH STENT PLACEMENT  1999; 2007   "1 + 1"   ERCP N/A 02/03/2022   Procedure: ENDOSCOPIC RETROGRADE CHOLANGIOPANCREATOGRAPHY (ERCP);  Surgeon: Carol Ada, MD;  Location: [place];  Service: Gastroenterology;  Laterality: N/A;   ESOPHAGOGASTRODUODENOSCOPY (EGD) WITH PROPOFOL N/A 03/13/2019   Procedure: ESOPHAGOGASTRODUODENOSCOPY (EGD) WITH PROPOFOL;  Surgeon: Jerene Bears, MD;  Location: [place] [facility];  Service: Gastroenterology;  Laterality: N/A;   ESOPHAGOGASTRODUODENOSCOPY (EGD) WITH PROPOFOL N/A 05/27/2020   Procedure: ESOPHAGOGASTRODUODENOSCOPY (EGD) WITH PROPOFOL;  Surgeon: [name], DO;  [facility];  Service: Endoscopy;  Laterality: N/A;   EYE SURGERY Bilateral    lazer   FLEXIBLE SIGMOIDOSCOPY N/A 05/23/2022   Procedure: FLEXIBLE SIGMOIDOSCOPY;  Surgeon: Carol Ada, MD;  Location: [place];  Service: Gastroenterology;  Laterality: N/A;   FLEXIBLE SIGMOIDOSCOPY N/A 05/24/2022   Procedure: FLEXIBLE SIGMOIDOSCOPY;  Surgeon: Sharyn Creamer, MD;  Location: [place];  Service: Gastroenterology;  Laterality: N/A;   HEMOSTASIS CLIP PLACEMENT  03/13/2019   Procedure: HEMOSTASIS CLIP PLACEMENT;  Surgeon: Jerene Bears, MD;  Location: [facility] ENDOSCOPY;  Service: Gastroenterology;;   HEMOSTASIS CLIP PLACEMENT  05/23/2022   Procedure: HEMOSTASIS CLIP PLACEMENT;  Surgeon: Carol Ada, MD;  Location: [place];  Service: Gastroenterology;;   HEMOSTASIS CONTROL  05/24/2022   Procedure: HEMOSTASIS CONTROL;  Surgeon: Sharyn Creamer, MD;  Location: [place];  Service: Gastroenterology;;   HOT HEMOSTASIS N/A 05/23/2022   Procedure: HOT HEMOSTASIS (ARGON PLASMA COAGULATION/BICAP);  Surgeon: Carol Ada, MD;  Location: [place];  Service: Gastroenterology;  Laterality: N/A;   I & D EXTREMITY Left 05/05/2022   Procedure: IRRIGATION AND DEBRIDEMENT LEFT ARM AV FISTULA;  Surgeon: Marty Heck, MD;  Location: [place];  Service: Vascular;  Laterality: Left;   INSERTION OF DIALYSIS CATHETER Right 04/02/2022   Procedure: INSERTION OF TUNNELED DIALYSIS CATHETER;  Surgeon: Serafina Mitchell, MD;  Location: [facility] OR;  Service: Vascular;  Laterality: Right;   KNEE ARTHROSCOPY Left 10/25/2006   POLYPECTOMY  03/13/2019   Procedure: POLYPECTOMY;  Surgeon: Jerene Bears, MD;  Location: [place] [facility];  Service: Gastroenterology;;   REMOVAL OF STONES  02/03/2022   Procedure: REMOVAL OF STONES;  Surgeon: Carol Ada, MD;  Location: [place];  Service: Gastroenterology;;   RIGHT HEART CATH N/A 07/24/2017   Procedure: RIGHT HEART CATH;  Surgeon: Jolaine Artist, MD;  Location: [place] CV LAB;  Service: Cardiovascular;  Laterality: N/A;   RIGHT HEART CATHETERIZATION N/A 09/22/2013   Procedure: RIGHT HEART CATH;  Surgeon: Jolaine Artist, MD;  Location: [facility] CATH LAB;  Service: Cardiovascular;  Laterality: N/A;   SHOULDER ARTHROSCOPY WITH OPEN ROTATOR CUFF REPAIR Right 03/14/2014   Procedure: RIGHT SHOULDER ARTHROSCOPY WITH BICEPS RELEASE, OPEN SUBSCAPULA REPAIR, OPEN SUPRASPINATUS REPAIR.;  Surgeon: Meredith Pel, MD;  Location: [place];  Service: Orthopedics;  Laterality: Right;   SPHINCTEROTOMY  02/03/2022   Procedure: SPHINCTEROTOMY;  Surgeon: Carol Ada, MD;  Location: [place];  Service: Gastroenterology;;   STUMP REVISION Right 06/13/2022   Procedure: REVISION RIGHT BELOW KNEE AMPUTATION;  Surgeon: Newt Minion, MD;  Location: [place];  Service: Orthopedics;  Laterality: Right;   TEE WITHOUT CARDIOVERSION N/A 02/04/2022   Procedure: TRANSESOPHAGEAL ECHOCARDIOGRAM (TEE);  Surgeon: Jolaine Artist, MD;  Location: [facility] ENDOSCOPY;  Service: Cardiovascular;  Laterality: N/A;   TOE AMPUTATION Right 02/24/2018   GREAT TOE AND 2ND TOE AMPUTATION   TUBAL LIGATION  1970's    MEDICATIONS: No current facility-administered medications for this encounter.    albuterol  (VENTOLIN HFA) 108 (90 Base) MCG/ACT inhaler   amiodarone (PACERONE) 200 MG tablet   aspirin EC 81 MG tablet   B Complex-C-Folic Acid (DIALYVITE TABLET) TABS   blood glucose meter kit and supplies   Continuous Blood Gluc Sensor (FREESTYLE LIBRE 2 SENSOR) MISC   diphenoxylate-atropine (LOMOTIL) 2.5-0.025 MG tablet   ferrous  sulfate 325 (65 FE) MG tablet   Fingerstix Lancets MISC   folic acid-vitamin b complex-vitamin c-selenium-zinc (DIALYVITE) 3 MG TABS tablet   hydrALAZINE (APRESOLINE) 25 MG tablet   hydrocortisone (ANUSOL-HC) 2.5 % rectal cream   insulin aspart (NOVOLOG FLEXPEN) 100 UNIT/ML FlexPen   insulin glargine (LANTUS SOLOSTAR) 100 UNIT/ML Solostar Pen   Insulin Pen Needle 32G X 4 MM MISC   levothyroxine (SYNTHROID, LEVOTHROID) 50 MCG tablet   linagliptin (TRADJENTA) 5 MG TABS tablet   LORazepam (ATIVAN) 0.5 MG tablet   magnesium oxide (MAG-OX) 400 (240 Mg) MG tablet   Nutritional Supplements (FEEDING SUPPLEMENT, GLUCERNA 1.2 CAL,) LIQD   ondansetron (ZOFRAN) 4 MG tablet   oxyCODONE-acetaminophen (PERCOCET/ROXICET) 5-325 MG tablet   OXYGEN   pantoprazole (PROTONIX) 40 MG tablet   polyethylene glycol (MIRALAX / GLYCOLAX) 17 g packet   pregabalin (LYRICA) 75 MG capsule   saccharomyces boulardii (FLORASTOR) 250 MG capsule   senna-docusate (SENOKOT-S) 8.6-50 MG tablet   sevelamer carbonate (RENVELA) 800 MG tablet   vortioxetine HBr (TRINTELLIX) 20 MG TABS tablet    Myra Gianotti, PA-C Surgical Short Stay/Anesthesiology [facility] Phone [phone] [facility] Phone [phone] 07/30/2022 1:11 PM

## 2022-07-31 ENCOUNTER — Other Ambulatory Visit: Payer: Self-pay

## 2022-07-31 ENCOUNTER — Ambulatory Visit (HOSPITAL_COMMUNITY)
Admission: RE | Admit: 2022-07-31 | Discharge: 2022-07-31 | Disposition: A | Discharge status: Home / Self Care | Payer: PPO | Attending: Surgery | Admitting: Surgery

## 2022-07-31 ENCOUNTER — Encounter (HOSPITAL_COMMUNITY): Payer: Self-pay | Admitting: Surgery

## 2022-07-31 ENCOUNTER — Encounter (HOSPITAL_COMMUNITY)
Admission: RE | Disposition: A | Discharge status: Home / Self Care | Payer: Self-pay | Source: Home / Self Care | Attending: Surgery

## 2022-07-31 ENCOUNTER — Other Ambulatory Visit (HOSPITAL_COMMUNITY): Payer: Self-pay

## 2022-07-31 ENCOUNTER — Ambulatory Visit (HOSPITAL_COMMUNITY): Payer: PPO | Admitting: Physician Assistant

## 2022-07-31 ENCOUNTER — Ambulatory Visit (HOSPITAL_BASED_OUTPATIENT_CLINIC_OR_DEPARTMENT_OTHER): Payer: PPO | Admitting: Physician Assistant

## 2022-07-31 DIAGNOSIS — D631 Anemia in chronic kidney disease: Secondary | ICD-10-CM

## 2022-07-31 DIAGNOSIS — I4891 Unspecified atrial fibrillation: Secondary | ICD-10-CM | POA: Insufficient documentation

## 2022-07-31 DIAGNOSIS — I5042 Chronic combined systolic (congestive) and diastolic (congestive) heart failure: Secondary | ICD-10-CM | POA: Diagnosis not present

## 2022-07-31 DIAGNOSIS — E039 Hypothyroidism, unspecified: Secondary | ICD-10-CM | POA: Diagnosis not present

## 2022-07-31 DIAGNOSIS — N186 End stage renal disease: Secondary | ICD-10-CM

## 2022-07-31 DIAGNOSIS — I132 Hypertensive heart and chronic kidney disease with heart failure and with stage 5 chronic kidney disease, or end stage renal disease: Secondary | ICD-10-CM

## 2022-07-31 DIAGNOSIS — I25119 Atherosclerotic heart disease of native coronary artery with unspecified angina pectoris: Secondary | ICD-10-CM

## 2022-07-31 DIAGNOSIS — Z87891 Personal history of nicotine dependence: Secondary | ICD-10-CM | POA: Diagnosis not present

## 2022-07-31 DIAGNOSIS — I2510 Atherosclerotic heart disease of native coronary artery without angina pectoris: Secondary | ICD-10-CM | POA: Diagnosis not present

## 2022-07-31 DIAGNOSIS — I252 Old myocardial infarction: Secondary | ICD-10-CM | POA: Insufficient documentation

## 2022-07-31 DIAGNOSIS — I959 Hypotension, unspecified: Secondary | ICD-10-CM | POA: Diagnosis not present

## 2022-07-31 DIAGNOSIS — I509 Heart failure, unspecified: Secondary | ICD-10-CM | POA: Diagnosis not present

## 2022-07-31 DIAGNOSIS — E1122 Type 2 diabetes mellitus with diabetic chronic kidney disease: Secondary | ICD-10-CM | POA: Diagnosis not present

## 2022-07-31 DIAGNOSIS — Z89512 Acquired absence of left leg below knee: Secondary | ICD-10-CM | POA: Insufficient documentation

## 2022-07-31 DIAGNOSIS — Z992 Dependence on renal dialysis: Secondary | ICD-10-CM | POA: Insufficient documentation

## 2022-07-31 DIAGNOSIS — Z79890 Hormone replacement therapy (postmenopausal): Secondary | ICD-10-CM | POA: Insufficient documentation

## 2022-07-31 DIAGNOSIS — Z7984 Long term (current) use of oral hypoglycemic drugs: Secondary | ICD-10-CM | POA: Insufficient documentation

## 2022-07-31 DIAGNOSIS — J9611 Chronic respiratory failure with hypoxia: Secondary | ICD-10-CM | POA: Diagnosis not present

## 2022-07-31 DIAGNOSIS — G709 Myoneural disorder, unspecified: Secondary | ICD-10-CM | POA: Insufficient documentation

## 2022-07-31 DIAGNOSIS — Z794 Long term (current) use of insulin: Secondary | ICD-10-CM | POA: Diagnosis not present

## 2022-07-31 DIAGNOSIS — Z4781 Encounter for orthopedic aftercare following surgical amputation: Secondary | ICD-10-CM | POA: Diagnosis not present

## 2022-07-31 DIAGNOSIS — R2689 Other abnormalities of gait and mobility: Secondary | ICD-10-CM | POA: Diagnosis not present

## 2022-07-31 DIAGNOSIS — K219 Gastro-esophageal reflux disease without esophagitis: Secondary | ICD-10-CM | POA: Insufficient documentation

## 2022-07-31 DIAGNOSIS — M1990 Unspecified osteoarthritis, unspecified site: Secondary | ICD-10-CM | POA: Diagnosis not present

## 2022-07-31 DIAGNOSIS — Z79899 Other long term (current) drug therapy: Secondary | ICD-10-CM | POA: Diagnosis not present

## 2022-07-31 DIAGNOSIS — M6281 Muscle weakness (generalized): Secondary | ICD-10-CM | POA: Diagnosis not present

## 2022-07-31 DIAGNOSIS — Z955 Presence of coronary angioplasty implant and graft: Secondary | ICD-10-CM | POA: Diagnosis not present

## 2022-07-31 DIAGNOSIS — N185 Chronic kidney disease, stage 5: Secondary | ICD-10-CM | POA: Diagnosis not present

## 2022-07-31 HISTORY — PX: BASCILIC VEIN TRANSPOSITION: SHX5742

## 2022-07-31 LAB — POCT I-STAT, CHEM 8
BUN: 49 mg/dL — ABNORMAL HIGH (ref 8–23)
Calcium, Ion: 1.17 mmol/L (ref 1.15–1.40)
Chloride: 91 mmol/L — ABNORMAL LOW (ref 98–111)
Creatinine, Ser: 4.2 mg/dL — ABNORMAL HIGH (ref 0.44–1.00)
Glucose, Bld: 300 mg/dL — ABNORMAL HIGH (ref 70–99)
HCT: 33 % — ABNORMAL LOW (ref 36.0–46.0)
Hemoglobin: 11.2 g/dL — ABNORMAL LOW (ref 12.0–15.0)
Potassium: 4.1 mmol/L (ref 3.5–5.1)
Sodium: 131 mmol/L — ABNORMAL LOW (ref 135–145)
TCO2: 32 mmol/L (ref 22–32)

## 2022-07-31 LAB — GLUCOSE, CAPILLARY
Glucose-Capillary: 296 mg/dL — ABNORMAL HIGH (ref 70–99)
Glucose-Capillary: 237 mg/dL — ABNORMAL HIGH (ref 70–99)

## 2022-07-31 SURGERY — TRANSPOSITION, VEIN, BASILIC
Anesthesia: General | Laterality: Left

## 2022-07-31 MED ORDER — CHLORHEXIDINE GLUCONATE 4 % EX LIQD: 60.0000 mL | Freq: Once | CUTANEOUS | Status: DC

## 2022-07-31 MED ORDER — PROPOFOL 1000 MG/100ML IV EMUL
INTRAVENOUS | Status: AC
  Filled 2022-07-31: qty 100

## 2022-07-31 MED ORDER — CHLORHEXIDINE GLUCONATE 0.12 % MT SOLN
15.0000 mL | Freq: Once | OROMUCOSAL | Status: AC
  Administered 2022-07-31: 15 mL via OROMUCOSAL
  Filled 2022-07-31: qty 15

## 2022-07-31 MED ORDER — LIDOCAINE-EPINEPHRINE (PF) 1 %-1:200000 IJ SOLN
INTRAMUSCULAR | Status: AC
  Filled 2022-07-31: qty 30

## 2022-07-31 MED ORDER — PROPOFOL 10 MG/ML IV BOLUS
INTRAVENOUS | Status: AC
  Filled 2022-07-31: qty 20

## 2022-07-31 MED ORDER — 0.9 % SODIUM CHLORIDE (POUR BTL) OPTIME
TOPICAL | Status: DC | PRN
  Administered 2022-07-31: 1000 mL

## 2022-07-31 MED ORDER — PHENYLEPHRINE HCL (PRESSORS) 10 MG/ML IV SOLN
INTRAVENOUS | Status: AC
  Filled 2022-07-31: qty 1

## 2022-07-31 MED ORDER — FENTANYL CITRATE (PF) 250 MCG/5ML IJ SOLN
INTRAMUSCULAR | Status: AC
  Filled 2022-07-31: qty 5

## 2022-07-31 MED ORDER — LIDOCAINE 2% (20 MG/ML) 5 ML SYRINGE
INTRAMUSCULAR | Status: DC | PRN
  Administered 2022-07-31: 60 mg via INTRAVENOUS

## 2022-07-31 MED ORDER — MIDAZOLAM HCL 2 MG/2ML IJ SOLN
INTRAMUSCULAR | Status: AC
  Filled 2022-07-31: qty 2

## 2022-07-31 MED ORDER — FENTANYL CITRATE (PF) 100 MCG/2ML IJ SOLN: 25.0000 ug | INTRAMUSCULAR | Status: DC | PRN

## 2022-07-31 MED ORDER — ROCURONIUM BROMIDE 10 MG/ML (PF) SYRINGE
PREFILLED_SYRINGE | INTRAVENOUS | Status: DC | PRN
  Administered 2022-07-31: 40 mg via INTRAVENOUS

## 2022-07-31 MED ORDER — BUPIVACAINE LIPOSOME 1.3 % IJ SUSP
INTRAMUSCULAR | Status: AC
  Filled 2022-07-31: qty 20

## 2022-07-31 MED ORDER — OXYCODONE-ACETAMINOPHEN 5-325 MG PO TABS
1.0000 | ORAL_TABLET | Freq: Four times a day (QID) | ORAL | 0 refills | Status: DC | PRN
  Filled 2022-07-31: qty 20, 5d supply, fill #0

## 2022-07-31 MED ORDER — DEXAMETHASONE SODIUM PHOSPHATE 10 MG/ML IJ SOLN
INTRAMUSCULAR | Status: DC | PRN
  Administered 2022-07-31: 5 mg via INTRAVENOUS

## 2022-07-31 MED ORDER — SODIUM CHLORIDE 0.9 % IV SOLN: INTRAVENOUS | Status: DC

## 2022-07-31 MED ORDER — INSULIN ASPART 100 UNIT/ML IJ SOLN
0.0000 [IU] | INTRAMUSCULAR | Status: AC | PRN
  Administered 2022-07-31: 3 [IU] via SUBCUTANEOUS
  Administered 2022-07-31: 4 [IU] via SUBCUTANEOUS
  Filled 2022-07-31: qty 1

## 2022-07-31 MED ORDER — PHENYLEPHRINE HCL (PRESSORS) 10 MG/ML IV SOLN
INTRAVENOUS | Status: DC | PRN
  Administered 2022-07-31: 160 ug via INTRAVENOUS
  Administered 2022-07-31: 80 ug via INTRAVENOUS

## 2022-07-31 MED ORDER — INSULIN ASPART 100 UNIT/ML IJ SOLN: 0.0000 [IU] | Freq: Three times a day (TID) | INTRAMUSCULAR | Status: DC

## 2022-07-31 MED ORDER — HEPARIN 6000 UNIT IRRIGATION SOLUTION
Status: DC | PRN
  Administered 2022-07-31: 1

## 2022-07-31 MED ORDER — VANCOMYCIN HCL IN DEXTROSE 1-5 GM/200ML-% IV SOLN
1000.0000 mg | INTRAVENOUS | Status: AC
  Administered 2022-07-31: 1000 mg via INTRAVENOUS
  Filled 2022-07-31: qty 200

## 2022-07-31 MED ORDER — ACETAMINOPHEN 500 MG PO TABS
1000.0000 mg | ORAL_TABLET | Freq: Once | ORAL | Status: AC
  Administered 2022-07-31: 1000 mg via ORAL
  Filled 2022-07-31: qty 2

## 2022-07-31 MED ORDER — SUGAMMADEX SODIUM 200 MG/2ML IV SOLN
INTRAVENOUS | Status: DC | PRN
  Administered 2022-07-31: 200 mg via INTRAVENOUS

## 2022-07-31 MED ORDER — HEPARIN 6000 UNIT IRRIGATION SOLUTION
Status: AC
  Filled 2022-07-31: qty 500

## 2022-07-31 MED ORDER — BUPIVACAINE HCL (PF) 0.5 % IJ SOLN
INTRAMUSCULAR | Status: AC
  Filled 2022-07-31: qty 30

## 2022-07-31 MED ORDER — SODIUM CHLORIDE FLUSH 0.9 % IV SOLN
INTRAVENOUS | Status: DC | PRN
  Administered 2022-07-31: 89 mL

## 2022-07-31 MED ORDER — SURGIFLO WITH THROMBIN (HEMOSTATIC MATRIX KIT) OPTIME
TOPICAL | Status: DC | PRN
  Administered 2022-07-31: 1 via TOPICAL

## 2022-07-31 MED ORDER — MIDAZOLAM HCL 2 MG/2ML IJ SOLN
INTRAMUSCULAR | Status: DC | PRN
  Administered 2022-07-31: .5 mg via INTRAVENOUS

## 2022-07-31 MED ORDER — LACTATED RINGERS IV SOLN: INTRAVENOUS | Status: DC

## 2022-07-31 MED ORDER — ONDANSETRON HCL 4 MG/2ML IJ SOLN
INTRAMUSCULAR | Status: DC | PRN
  Administered 2022-07-31: 4 mg via INTRAVENOUS

## 2022-07-31 MED ORDER — FENTANYL CITRATE (PF) 250 MCG/5ML IJ SOLN
INTRAMUSCULAR | Status: DC | PRN
  Administered 2022-07-31: 50 ug via INTRAVENOUS

## 2022-07-31 MED ORDER — PROPOFOL 10 MG/ML IV BOLUS
INTRAVENOUS | Status: DC | PRN
  Administered 2022-07-31: 50 mg via INTRAVENOUS

## 2022-07-31 MED ORDER — ORAL CARE MOUTH RINSE: 15.0000 mL | Freq: Once | OROMUCOSAL | Status: AC

## 2022-07-31 SURGICAL SUPPLY — 42 items
ADH SKN CLS APL DERMABOND .7 (GAUZE/BANDAGES/DRESSINGS) ×2
AGENT HMST KT MTR STRL THRMB (HEMOSTASIS) ×1
ARMBAND PINK RESTRICT EXTREMIT (MISCELLANEOUS) ×1 IMPLANT
BAG COUNTER SPONGE SURGICOUNT (BAG) ×1 IMPLANT
BAG SPNG CNTER NS LX DISP (BAG)
CANISTER SUCT 3000ML PPV (MISCELLANEOUS) ×1 IMPLANT
CLIP TI MEDIUM 24 (CLIP) IMPLANT
CLIP TI MEDIUM 6 (CLIP) IMPLANT
CLIP TI WIDE RED SMALL 24 (CLIP) IMPLANT
CLIP TI WIDE RED SMALL 6 (CLIP) IMPLANT
COVER PROBE W GEL 5X96 (DRAPES) ×1 IMPLANT
DERMABOND ADVANCED .7 DNX12 (GAUZE/BANDAGES/DRESSINGS) ×1 IMPLANT
ELECT REM PT RETURN 9FT ADLT (ELECTROSURGICAL) ×1
ELECTRODE REM PT RTRN 9FT ADLT (ELECTROSURGICAL) ×1 IMPLANT
GLOVE SURG SS PI 7.5 STRL IVOR (GLOVE) ×3 IMPLANT
GOWN STRL REUS W/ TWL LRG LVL3 (GOWN DISPOSABLE) ×2 IMPLANT
GOWN STRL REUS W/ TWL XL LVL3 (GOWN DISPOSABLE) ×1 IMPLANT
GOWN STRL REUS W/TWL LRG LVL3 (GOWN DISPOSABLE) ×1
GOWN STRL REUS W/TWL XL LVL3 (GOWN DISPOSABLE) ×3
HEMOSTAT SNOW SURGICEL 2X4 (HEMOSTASIS) IMPLANT
KIT BASIN OR (CUSTOM PROCEDURE TRAY) ×1 IMPLANT
KIT TURNOVER KIT B (KITS) ×1 IMPLANT
NDL 18GX1X1/2 (RX/OR ONLY) (NEEDLE) ×1 IMPLANT
NEEDLE 18GX1X1/2 (RX/OR ONLY) (NEEDLE) ×1 IMPLANT
NS IRRIG 1000ML POUR BTL (IV SOLUTION) ×1 IMPLANT
PACK CV ACCESS (CUSTOM PROCEDURE TRAY) ×1 IMPLANT
PAD ARMBOARD 7.5X6 YLW CONV (MISCELLANEOUS) ×2 IMPLANT
SLING ARM FOAM STRAP LRG (SOFTGOODS) IMPLANT
SLING ARM FOAM STRAP MED (SOFTGOODS) IMPLANT
SPONGE T-LAP 18X18 ~~LOC~~+RFID (SPONGE) IMPLANT
SURGIFLO W/THROMBIN 8M KIT (HEMOSTASIS) IMPLANT
SUT PROLENE 5 0 C 1 24 (SUTURE) IMPLANT
SUT PROLENE 6 0 CC (SUTURE) ×1 IMPLANT
SUT SILK 2 0 SH (SUTURE) IMPLANT
SUT VIC AB 3-0 SH 27 (SUTURE) ×2
SUT VIC AB 3-0 SH 27X BRD (SUTURE) ×1 IMPLANT
SUT VIC AB 4-0 PS2 18 (SUTURE) IMPLANT
SUT VICRYL 4-0 PS2 18IN ABS (SUTURE) ×1 IMPLANT
SYR 30ML LL (SYRINGE) ×1 IMPLANT
TOWEL GREEN STERILE (TOWEL DISPOSABLE) ×1 IMPLANT
UNDERPAD 30X36 HEAVY ABSORB (UNDERPADS AND DIAPERS) ×1 IMPLANT
WATER STERILE IRR 1000ML POUR (IV SOLUTION) ×1 IMPLANT

## 2022-07-31 NOTE — Transfer of Care (Signed)
Immediate Anesthesia Transfer of Care Note  Patient: Susan [name]  Procedure(s) Performed: LEFT ARM SECOND STAGE BASILIC VEIN TRANSPOSITION (Left)  Patient Location: PACU  Anesthesia Type:General  Level of Consciousness: awake and patient cooperative  Airway & Oxygen Therapy: Patient Spontanous Breathing and Patient connected to face mask oxygen  Post-op Assessment: Report given to RN, Post -op Vital signs reviewed and stable, and Patient moving all extremities  Post vital signs: Reviewed and stable  Last Vitals:  Vitals Value Taken Time  BP 92/50 07/31/22 1022  Temp    Pulse 99 07/31/22 1025  Resp 13 07/31/22 1025  SpO2 95 % 07/31/22 1025  Vitals shown include unvalidated device data.  Last Pain:  Vitals:   07/31/22 0622  PainSc: 9          Complications: No notable events documented.

## 2022-07-31 NOTE — Anesthesia Procedure Notes (Signed)
Procedure Name: Intubation Date/Time: 07/31/2022 8:37 AM  Performed by: Elvin So, CRNAPre-anesthesia Checklist: Patient identified, Emergency Drugs available, Suction available and Patient being monitored Patient Re-evaluated:Patient Re-evaluated prior to induction Oxygen Delivery Method: Circle System Utilized Preoxygenation: Pre-oxygenation with 100% oxygen Induction Type: IV induction Ventilation: Mask ventilation without difficulty Laryngoscope Size: Mac and 3 Grade View: Grade I Tube type: Oral Tube size: 7.0 mm Number of attempts: 1 Airway Equipment and Method: Stylet and Oral airway Placement Confirmation: ETT inserted through vocal cords under direct vision, positive ETCO2 and breath sounds checked- equal and bilateral Secured at: 22 cm Tube secured with: Tape Dental Injury: Teeth and Oropharynx as per pre-operative assessment

## 2022-07-31 NOTE — Interval H&P Note (Signed)
History and Physical Interval Note:  07/31/2022 8:13 AM  Susan [name]  has presented today for surgery, with the diagnosis of End Stage Renal Disease.  The various methods of treatment have been discussed with the patient and family. After consideration of risks, benefits and other options for treatment, the patient has consented to  Procedure(s): LEFT ARM SECOND STAGE BASILIC VEIN TRANSPOSITION (Left) as a surgical intervention.  The patient's history has been reviewed, patient examined, no change in status, stable for surgery.  I have reviewed the patient's chart and labs.  Questions were answered to the patient's satisfaction.     Annamarie Major

## 2022-07-31 NOTE — Discharge Instructions (Signed)
° °  [facility] [place] ° °Discharge Instructions ° °AV Fistula or Graft Surgery for Dialysis Access ° °Please refer to the following instructions for your post-procedure care. Your surgeon or physician assistant will discuss any changes with you. ° °Activity ° °You may drive the day following your surgery, if you are comfortable and no longer taking prescription pain medication. Resume full activity as the soreness in your incision resolves. ° °Bathing/Showering ° °You may shower after you go home. Keep your incision dry for 48 hours. Do not soak in a bathtub, hot tub, or swim until the incision heals completely. You may not shower if you have a hemodialysis catheter. ° °Incision Care ° °Clean your incision with mild soap and water after 48 hours. Pat the area dry with a clean towel. You do not need a bandage unless otherwise instructed. Do not apply any ointments or creams to your incision. You may have skin glue on your incision. Do not peel it off. It will come off on its own in about one week. Your arm may swell a bit after surgery. To reduce swelling use pillows to elevate your arm so it is above your heart. Your doctor will tell you if you need to lightly wrap your arm with an ACE bandage. ° °Diet ° °Resume your normal diet. There are not special food restrictions following this procedure. In order to heal from your surgery, it is CRITICAL to get adequate nutrition. Your body requires vitamins, minerals, and protein. Vegetables are the best source of vitamins and minerals. Vegetables also provide the perfect balance of protein. Processed food has little nutritional value, so try to avoid this. ° °Medications ° °Resume taking all of your medications. If your incision is causing pain, you may take over-the counter pain relievers such as acetaminophen (Tylenol). If you were prescribed a stronger pain medication, please be aware these medications can cause nausea and constipation. Prevent  nausea by taking the medication with a snack or meal. Avoid constipation by drinking plenty of fluids and eating foods with high amount of fiber, such as fruits, vegetables, and grains. Do not take Tylenol if you are taking prescription pain medications. ° ° ° ° °Follow up °Your surgeon may want to see you in the office following your access surgery. If so, this will be arranged at the time of your surgery. ° °Please call us immediately for any of the following conditions: ° °Increased pain, redness, drainage (pus) from your incision site °Fever of 101 degrees or higher °Severe or worsening pain at your incision site °Hand pain or numbness. ° °Reduce your risk of vascular disease: ° °Stop smoking. If you would like help, call [facility] at 1-800-QUIT-NOW (1-800-784-8669) or [facility] at 336-586-4000 ° °Manage your cholesterol °Maintain a desired weight °Control your diabetes °Keep your blood pressure down ° °Dialysis ° °It will take several weeks to several months for your new dialysis access to be ready for use. Your surgeon will determine when it is OK to use it. Your nephrologist will continue to direct your dialysis. You can continue to use your Permcath until your new access is ready for use. ° °If you have any questions, please call the office at [phone]. ° °

## 2022-08-01 ENCOUNTER — Encounter (HOSPITAL_COMMUNITY): Payer: Self-pay | Admitting: Surgery

## 2022-08-01 DIAGNOSIS — E1165 Type 2 diabetes mellitus with hyperglycemia: Secondary | ICD-10-CM | POA: Diagnosis not present

## 2022-08-01 DIAGNOSIS — L089 Local infection of the skin and subcutaneous tissue, unspecified: Secondary | ICD-10-CM | POA: Diagnosis not present

## 2022-08-01 NOTE — Anesthesia Postprocedure Evaluation (Signed)
Anesthesia Post Note  Patient: Susan [name]  Procedure(s) Performed: LEFT ARM SECOND STAGE BASILIC VEIN TRANSPOSITION (Left)     Patient location during evaluation: PACU Anesthesia Type: General Level of consciousness: awake and alert Pain management: pain level controlled Vital Signs Assessment: post-procedure vital signs reviewed and stable Respiratory status: spontaneous breathing, nonlabored ventilation, respiratory function stable and patient connected to nasal cannula oxygen Cardiovascular status: blood pressure returned to baseline and stable Postop Assessment: no apparent nausea or vomiting Anesthetic complications: no   No notable events documented.  Last Vitals:  Vitals:   07/31/22 1145 07/31/22 1158  BP: (!) 92/48 104/60  Pulse: 84 86  Resp: 16 12  Temp:  37.1 C  SpO2: 97% 97%    Last Pain:  Vitals:   07/31/22 1158  PainSc: 0-No pain   Pain Goal:                   Tiajuana Amass

## 2022-08-01 NOTE — Op Note (Signed)
    Patient name: Susan [name] MRN: [id number] DOB: 03/03/1951 Sex: female  07/31/2022 Pre-operative Diagnosis: ESRD Post-operative diagnosis:  Same Surgeon:  Annamarie Major Assistants:  Laurence Slate Procedure:   Revision of left arm fistula (second stage basilic vein fistula creation Anesthesia:  General Blood Loss:  minimal Specimens:  none  Findings: Appearing basilic vein.  A end to end anastomosis was performed near the antecubital crease  Indications: This is a 72 year old female with end-stage renal disease who has previously undergone a first stage basilic vein fistula creation.  She comes in today for the second stage.  Procedure:  The patient was identified in the holding area and taken to [place] [address]  The patient was then placed supine on the table. general anesthesia was administered.  The patient was prepped and draped in the usual sterile fashion.  A time out was called and antibiotics were administered.  A PA was necessary to expedite the procedure and assist with technical details.  She helped with exposure of the vein by providing suction and retraction.  She help with following of the anastomosis, and wound closure.  Ultrasound was used to mark the basilic vein in the upper arm.  2 longitudinal incisions were made.  The basilic vein was fully mobilized from the antecubital crease up to the axilla.  Multiple branches were ligated between silk ties.  Once the vein was fully mobilized it was marked for orientation and then ligated near antecubital crease.  A subcutaneous tunnel was created with a curved Gore tunneler after infiltrating the tunnel with Exparel.  The vein graft was then brought through the tunnel making sure to maintain proper orientation.  I then performed a end-to-end anastomosis between the 2 ends of the vein with running 5-0 Prolene.  Prior to completion the appropriate flushing maneuvers were performed and the anastomosis was completed.  There was excellent thrill  within the fistula.  The wound was then irrigated.  Hemostasis was achieved.  The incisions were closed with 2 layers of Vicryl followed by Dermabond.  There were no immediate complications.   Disposition:  To PACU stable   V. Annamarie Major, M.D., [facility] Office: [phone] Pager:  [phone]

## 2022-08-04 DIAGNOSIS — Z4781 Encounter for orthopedic aftercare following surgical amputation: Secondary | ICD-10-CM | POA: Diagnosis not present

## 2022-08-04 DIAGNOSIS — R2689 Other abnormalities of gait and mobility: Secondary | ICD-10-CM | POA: Diagnosis not present

## 2022-08-04 DIAGNOSIS — E039 Hypothyroidism, unspecified: Secondary | ICD-10-CM | POA: Diagnosis not present

## 2022-08-04 DIAGNOSIS — L089 Local infection of the skin and subcutaneous tissue, unspecified: Secondary | ICD-10-CM | POA: Diagnosis not present

## 2022-08-04 DIAGNOSIS — I959 Hypotension, unspecified: Secondary | ICD-10-CM | POA: Diagnosis not present

## 2022-08-04 DIAGNOSIS — D689 Coagulation defect, unspecified: Secondary | ICD-10-CM | POA: Diagnosis not present

## 2022-08-04 DIAGNOSIS — D509 Iron deficiency anemia, unspecified: Secondary | ICD-10-CM | POA: Diagnosis not present

## 2022-08-04 DIAGNOSIS — E1165 Type 2 diabetes mellitus with hyperglycemia: Secondary | ICD-10-CM | POA: Diagnosis not present

## 2022-08-04 DIAGNOSIS — I5042 Chronic combined systolic (congestive) and diastolic (congestive) heart failure: Secondary | ICD-10-CM | POA: Diagnosis not present

## 2022-08-04 DIAGNOSIS — J9611 Chronic respiratory failure with hypoxia: Secondary | ICD-10-CM | POA: Diagnosis not present

## 2022-08-04 DIAGNOSIS — Z992 Dependence on renal dialysis: Secondary | ICD-10-CM | POA: Diagnosis not present

## 2022-08-04 DIAGNOSIS — N2581 Secondary hyperparathyroidism of renal origin: Secondary | ICD-10-CM | POA: Diagnosis not present

## 2022-08-04 DIAGNOSIS — M6281 Muscle weakness (generalized): Secondary | ICD-10-CM | POA: Diagnosis not present

## 2022-08-04 DIAGNOSIS — D631 Anemia in chronic kidney disease: Secondary | ICD-10-CM | POA: Diagnosis not present

## 2022-08-04 DIAGNOSIS — N186 End stage renal disease: Secondary | ICD-10-CM | POA: Diagnosis not present

## 2022-08-07 DIAGNOSIS — M6281 Muscle weakness (generalized): Secondary | ICD-10-CM | POA: Diagnosis not present

## 2022-08-07 DIAGNOSIS — I959 Hypotension, unspecified: Secondary | ICD-10-CM | POA: Diagnosis not present

## 2022-08-07 DIAGNOSIS — I5042 Chronic combined systolic (congestive) and diastolic (congestive) heart failure: Secondary | ICD-10-CM | POA: Diagnosis not present

## 2022-08-07 DIAGNOSIS — Z4781 Encounter for orthopedic aftercare following surgical amputation: Secondary | ICD-10-CM | POA: Diagnosis not present

## 2022-08-07 DIAGNOSIS — R2689 Other abnormalities of gait and mobility: Secondary | ICD-10-CM | POA: Diagnosis not present

## 2022-08-07 DIAGNOSIS — Z992 Dependence on renal dialysis: Secondary | ICD-10-CM | POA: Diagnosis not present

## 2022-08-07 DIAGNOSIS — J9611 Chronic respiratory failure with hypoxia: Secondary | ICD-10-CM | POA: Diagnosis not present

## 2022-08-11 ENCOUNTER — Other Ambulatory Visit: Payer: Self-pay

## 2022-08-11 ENCOUNTER — Telehealth: Payer: Self-pay

## 2022-08-11 ENCOUNTER — Other Ambulatory Visit: Payer: Self-pay | Admitting: Family Medicine

## 2022-08-11 DIAGNOSIS — J9611 Chronic respiratory failure with hypoxia: Secondary | ICD-10-CM | POA: Diagnosis not present

## 2022-08-11 DIAGNOSIS — N186 End stage renal disease: Secondary | ICD-10-CM | POA: Diagnosis not present

## 2022-08-11 DIAGNOSIS — L89223 Pressure ulcer of left hip, stage 3: Secondary | ICD-10-CM | POA: Diagnosis not present

## 2022-08-11 DIAGNOSIS — Z4781 Encounter for orthopedic aftercare following surgical amputation: Secondary | ICD-10-CM | POA: Diagnosis not present

## 2022-08-11 DIAGNOSIS — Z992 Dependence on renal dialysis: Secondary | ICD-10-CM | POA: Diagnosis not present

## 2022-08-11 DIAGNOSIS — I959 Hypotension, unspecified: Secondary | ICD-10-CM | POA: Diagnosis not present

## 2022-08-11 DIAGNOSIS — M6281 Muscle weakness (generalized): Secondary | ICD-10-CM | POA: Diagnosis not present

## 2022-08-11 DIAGNOSIS — R2689 Other abnormalities of gait and mobility: Secondary | ICD-10-CM | POA: Diagnosis not present

## 2022-08-11 DIAGNOSIS — D689 Coagulation defect, unspecified: Secondary | ICD-10-CM | POA: Diagnosis not present

## 2022-08-11 DIAGNOSIS — N2581 Secondary hyperparathyroidism of renal origin: Secondary | ICD-10-CM | POA: Diagnosis not present

## 2022-08-11 DIAGNOSIS — L89153 Pressure ulcer of sacral region, stage 3: Secondary | ICD-10-CM | POA: Diagnosis not present

## 2022-08-11 DIAGNOSIS — I5042 Chronic combined systolic (congestive) and diastolic (congestive) heart failure: Secondary | ICD-10-CM | POA: Diagnosis not present

## 2022-08-11 DIAGNOSIS — D509 Iron deficiency anemia, unspecified: Secondary | ICD-10-CM | POA: Diagnosis not present

## 2022-08-11 MED ORDER — VORTIOXETINE HBR 20 MG PO TABS: 20.0000 mg | ORAL_TABLET | Freq: Every morning | ORAL | 5 refills | Status: DC

## 2022-08-11 NOTE — Telephone Encounter (Signed)
Pt called and states needs a refill on her Trintellix sent to [facility]. Thank you.

## 2022-08-14 ENCOUNTER — Telehealth: Payer: Self-pay

## 2022-08-14 DIAGNOSIS — Z4781 Encounter for orthopedic aftercare following surgical amputation: Secondary | ICD-10-CM | POA: Diagnosis not present

## 2022-08-14 DIAGNOSIS — J9611 Chronic respiratory failure with hypoxia: Secondary | ICD-10-CM | POA: Diagnosis not present

## 2022-08-14 DIAGNOSIS — I959 Hypotension, unspecified: Secondary | ICD-10-CM | POA: Diagnosis not present

## 2022-08-14 DIAGNOSIS — M6281 Muscle weakness (generalized): Secondary | ICD-10-CM | POA: Diagnosis not present

## 2022-08-14 DIAGNOSIS — I5042 Chronic combined systolic (congestive) and diastolic (congestive) heart failure: Secondary | ICD-10-CM | POA: Diagnosis not present

## 2022-08-14 DIAGNOSIS — R2689 Other abnormalities of gait and mobility: Secondary | ICD-10-CM | POA: Diagnosis not present

## 2022-08-14 DIAGNOSIS — Z992 Dependence on renal dialysis: Secondary | ICD-10-CM | POA: Diagnosis not present

## 2022-08-14 NOTE — Telephone Encounter (Signed)
Spoke with Rosendo Gros at [facility] to schedule pt's f/u appt with [name] for next week. She has asked me to call HD center to let them know of this appt, as pt has HD that morning. [facility] will arrange with Rosendo Gros for pt to come in on Tues instead of Monday.

## 2022-08-15 ENCOUNTER — Telehealth: Payer: Self-pay | Admitting: Pharmacist

## 2022-08-15 NOTE — Progress Notes (Signed)
Care Management & Coordination Services Pharmacy Team   Reason for Encounter: Hypertension   Contacted patient to discuss hypertension disease state. Unsuccessful outreach. Left voicemail for patient to return call.    Current antihypertensive regimen:  amiodarone (PACERONE) 200 MG tablet  hydrALAZINE (APRESOLINE) 25 MG tablet    Patient verbally confirms she is taking the above medications as directed. {yes/no:20286}  How often are you checking your Blood Pressure? {CHL HP BP Monitoring [phone]}  she checks her blood pressure {timing:25218} {before/after:25217} taking her medication.  Current home BP readings: ***  DATE:             BP               PULSE   Wrist or arm cuff: Caffeine intake: Salt intake: OTC medications including pseudoephedrine or NSAIDs?  Any readings above 180/100? {yes/no:20286} If yes any symptoms of hypertensive emergency? {hypertensive emergency symptoms:25354}  What recent interventions/DTPs have been made by any provider to improve Blood Pressure control since last CPP Visit:  Patient   Any recent hospitalizations or ED visits since last visit with CPP? Yes  What diet changes have been made to improve Blood Pressure Control?  Patient reported  What exercise is being done to improve your Blood Pressure Control?  Patient reported  Adherence Review: Is the patient currently on ACE/ARB medication? {yes/no:20286} Does the patient have >5 day gap between last estimated fill dates? {yes/no:20286}  Star Rating Drugs:  Medication:    Last Fill: Day Supply  No Star Rating Drugs noted   Chart Updates: Recent office visits:  04/23/22 [name], MD - Family Medicine - Anemia - Labs were ordered. Referral to Urology placed. sevelamer carbonate (RENVELA) 800 MG tablet prescribed (INCREASED DOSE). Follow up as scheduled.   Recent consult visits:  07/29/22 Jen Mow MD - Orthopedics - Post op - No medication changes. Follow up  as scheduled.   07/15/22 Meridee Score, MD - Orthopedics - Amputation - No changes noted. Follow up in 2 weeks.   07/03/22 Leontine Locket, PA-C - Vascular surgery - ESRD on dialysis - No changes noted. Follow up as scheduled.   07/01/22 Meridee Score, MD - Right below knee amputation - No changes noted. Follow up in 2 weeks.      Hospital visits: 07/30/21 Medication Reconciliation was completed by comparing discharge summary, patient's EMR and Pharmacy list, and upon discussion with patient.  Admitted to the hospital on 07/30/21 Clotted dialysis access due to . Discharge date was 07/30/21. Discharged from [place]?Medications Started at [facility] Discharge:?? None noted.   Medication Changes at Hospital Discharge: None noted  Medications Discontinued at Hospital Discharge: None noted  Medications that remain the same after Hospital Discharge:??  All other medications will remain the same.    Hospital visits: 06/05/22 - 06/17/22 Medication Reconciliation was completed by comparing discharge summary, patient's EMR and Pharmacy list, and upon discussion with patient.  Admitted to the hospital on 06/05/22 due to Diverticulitis. Discharge date was 06/17/22. Discharged from [place]?Medications Started at [facility] Discharge:?? LORazepam (ATIVAN)  Medication Changes at Hospital Discharge: Lantus SoloStar (insulin glargine) polyethylene glycol (MIRALAX / GLYCOLAX) senna-docusate (Senokot-S) Review your updated medication list  Medications Discontinued at Hospital Discharge: None noted  Medications that remain the same after Hospital Discharge:??  All other medications will remain the same.    Hospital visits: 05/22/22 - 05/29/22 Medication Reconciliation was completed by comparing discharge summary, patient's EMR and  Pharmacy list, and upon discussion with patient.  Admitted to the hospital on 05/22/22 due to Acute GI bleed. Discharge date was 05/29/22.  Discharged from [place]?Medications Started at [facility] Discharge:?? midodrine (PROAMATINE) pantoprazole (Protonix)  Medication Changes at Hospital Discharge: Aspirin  Medications Discontinued at Hospital Discharge: apixaban 5 MG Tabs tablet (ELIQUIS) carvedilol 6.25 MG tablet (COREG)  Medications that remain the same after Hospital Discharge:??  All other medications will remain the same.    Hospital visits: 05/02/22 - 05/12/22 Medication Reconciliation was completed by comparing discharge summary, patient's EMR and Pharmacy list, and upon discussion with patient.  Admitted to the hospital on 05/02/22 due to DKA. Discharge date was 05/12/22. Discharged from [place]?Medications Started at [facility] Discharge:?? None noted  Medication Changes at Hospital Discharge: oxyCODONE-acetaminophen (PERCOCET/ROXICET)  Medications Discontinued at Hospital Discharge: insulin pump Soln  Medications that remain the same after Hospital Discharge:??  All other medications will remain the same.    Medications: Outpatient Encounter Medications as of 08/15/2022  Medication Sig   albuterol (VENTOLIN HFA) 108 (90 Base) MCG/ACT inhaler Inhale 1-2 puffs into the lungs every 6 (six) hours as needed for wheezing or shortness of breath.   Amino Acids-Protein Hydrolys (FEEDING SUPPLEMENT, PRO-STAT SUGAR FREE 64,) LIQD Take 30 mLs by mouth in the morning and at bedtime.   amiodarone (PACERONE) 200 MG tablet Take 1 tablet (200 mg total) by mouth daily.   aspirin EC 81 MG tablet Take 1 tablet (81 mg total) by mouth daily with breakfast.   B Complex-C-Folic Acid (DIALYVITE TABLET) TABS Take 1 tablet by mouth in the morning. (1000)   blood glucose meter kit and supplies Use up to four times daily as directed.   Continuous Blood Gluc Sensor (FREESTYLE LIBRE 2 SENSOR) MISC    diphenoxylate-atropine (LOMOTIL) 2.5-0.025 MG tablet Take 1 tablet by mouth 4 (four) times daily  as needed for diarrhea or loose stools.   ferrous sulfate 325 (65 FE) MG tablet Take 325 mg by mouth 2 (two) times daily. (1000 & 2100)   Fingerstix Lancets MISC Use as directed to check blood sugars as need up to 4 times daily   folic acid-vitamin b complex-vitamin c-selenium-zinc (DIALYVITE) 3 MG TABS tablet Take 1 tablet by mouth daily.   hydrALAZINE (APRESOLINE) 25 MG tablet Take 1 tablet (25 mg total) by mouth every 6 (six) hours as needed (SBP>150 or DBP>100).   hydrocortisone (ANUSOL-HC) 2.5 % rectal cream Place 1 Application rectally 2 (two) times daily. (1000 & 2100)   hydrOXYzine (ATARAX) 25 MG tablet Take 25 mg by mouth every 6 (six) hours as needed for anxiety.   insulin aspart (NOVOLOG FLEXPEN) 100 UNIT/ML FlexPen Inject 0-15 Units into the skin See admin instructions. Inject 6 units subcutaneously 3 times daily with meals (0900, 1300 & 1700) & as needed via sliding scale insulin CBG <70=Notify NP/MD 71-149=0 units,150-199=4 units, 200-249=6 units,250- 299=10 units, 300-349=12 units, 350-399=15 units, > 400 call MD ( prime pen with 2 units prior  to set dose)   insulin glargine (LANTUS SOLOSTAR) 100 UNIT/ML Solostar Pen Inject 40 Units into the skin at bedtime. (Patient taking differently: Inject 25 Units into the skin at bedtime.)   Insulin Pen Needle 32G X 4 MM MISC Use as directed   levothyroxine (SYNTHROID, LEVOTHROID) 50 MCG tablet Take 1 tablet (50 mcg total) by mouth daily before breakfast.   linagliptin (TRADJENTA) 5 MG TABS tablet Take 1 tablet (5 mg  total) by mouth daily.   LORazepam (ATIVAN) 0.5 MG tablet Take 1 tablet (0.5 mg total) by mouth every 8 (eight) hours as needed for anxiety. (Patient not taking: Reported on 07/30/2022)   magnesium oxide (MAG-OX) 400 (240 Mg) MG tablet Take 1 tablet (400 mg total) by mouth daily.   midodrine (PROAMATINE) 10 MG tablet Take 10 mg by mouth every Monday, Wednesday, and Friday with hemodialysis.   midodrine (PROAMATINE) 5 MG tablet Take 5  mg by mouth See admin instructions. Take 1 tablet (5 mg) by mouth with meals on Tuesdays, Thursdays, Saturdays & Sundays.   Nutritional Supplements (FEEDING SUPPLEMENT, NEPRO CARB STEADY,) LIQD Take 237 mLs by mouth daily.   ondansetron (ZOFRAN) 4 MG tablet Take 4 mg by mouth every 8 (eight) hours as needed for nausea.   oxyCODONE-acetaminophen (PERCOCET/ROXICET) 5-325 MG tablet Take 1 tablet by mouth every 6 (six) hours as needed.   OXYGEN Inhale 2 L/min into the lungs at bedtime.   pantoprazole (PROTONIX) 40 MG tablet Take 1 tablet (40 mg total) by mouth daily.   polyethylene glycol (MIRALAX / GLYCOLAX) 17 g packet Take 17 g by mouth 2 (two) times daily. (Patient taking differently: Take 17 g by mouth daily as needed (constipation.).)   pregabalin (LYRICA) 75 MG capsule Take 1 capsule (75 mg total) by mouth daily.   Semaglutide (OZEMPIC, 0.25 OR 0.5 MG/DOSE, [place]) Inject 0.25 mg into the skin every Saturday.   senna-docusate (SENOKOT-S) 8.6-50 MG tablet Take 2 tablets by mouth 2 (two) times daily.   sevelamer carbonate (RENVELA) 800 MG tablet Take 1,600 mg by mouth 3 (three) times daily with meals. (0800, 1200 & 1700)   vortioxetine HBr (TRINTELLIX) 20 MG TABS tablet Take 1 tablet (20 mg total) by mouth in the morning. (1000)   No facility-administered encounter medications on file as of 08/15/2022.    Recent Office Vitals: BP Readings from Last 3 Encounters:  07/31/22 104/60  07/03/22 138/77  06/17/22 (!) 134/58   Pulse Readings from Last 3 Encounters:  07/31/22 86  07/03/22 76  06/17/22 84    Wt Readings from Last 3 Encounters:  07/29/22 213 lb (96.6 kg)  07/03/22 220 lb (99.8 kg)  06/16/22 220 lb 7.4 oz (100 kg)     Kidney Function Lab Results  Component Value Date/Time   CREATININE 4.20 (H) 07/31/2022 06:48 AM   CREATININE 2.93 (H) 06/16/2022 03:43 AM   CREATININE 6.22 (H) 03/26/2022 04:20 PM   CREATININE 5.05 (H) 03/06/2022 03:23 PM   GFR 41.58 (L) 08/17/2013 02:46 PM    GFRNONAA 16 (L) 06/16/2022 03:43 AM   GFRNONAA 11 (L) 11/15/2020 02:52 PM   GFRAA 13 (L) 11/15/2020 02:52 PM       Latest Ref Rng & Units 07/31/2022    6:48 AM 06/16/2022    3:43 AM 06/14/2022    5:28 AM  BMP  Glucose 70 - 99 mg/dL 300  137  409   BUN 8 - 23 mg/dL 49  64  37   Creatinine 0.44 - 1.00 mg/dL 4.20  2.93  2.56   Sodium 135 - 145 mmol/L 131  128  131   Potassium 3.5 - 5.1 mmol/L 4.1  4.3  5.0   Chloride 98 - 111 mmol/L 91  96  97   CO2 22 - 32 mmol/L  26  23   Calcium 8.9 - 10.3 mg/dL  8.6  8.9       Future Appointments  Date Time Provider Department  Center  08/18/2022 10:00 AM Serafina Mitchell, MD [facility]  08/26/2022  3:30 PM Newt Minion, MD [facility] None  08/28/2022  3:30 PM [facility]-ANNUAL WELLNESS VISIT [facility] [place] [place]

## 2022-08-18 ENCOUNTER — Ambulatory Visit (INDEPENDENT_AMBULATORY_CARE_PROVIDER_SITE_OTHER): Payer: PPO | Admitting: Surgery

## 2022-08-18 ENCOUNTER — Encounter: Payer: Self-pay | Admitting: Surgery

## 2022-08-18 ENCOUNTER — Encounter: Payer: Self-pay | Admitting: Orthopedic Surgery

## 2022-08-18 ENCOUNTER — Other Ambulatory Visit: Payer: Self-pay

## 2022-08-18 VITALS — BP 152/67 | HR 96 | Temp 98.1°F | Resp 20 | Ht 65.0 in | Wt 213.0 lb

## 2022-08-18 DIAGNOSIS — N186 End stage renal disease: Secondary | ICD-10-CM

## 2022-08-18 DIAGNOSIS — M6281 Muscle weakness (generalized): Secondary | ICD-10-CM | POA: Diagnosis not present

## 2022-08-18 DIAGNOSIS — D509 Iron deficiency anemia, unspecified: Secondary | ICD-10-CM | POA: Diagnosis not present

## 2022-08-18 DIAGNOSIS — Z992 Dependence on renal dialysis: Secondary | ICD-10-CM | POA: Diagnosis not present

## 2022-08-18 DIAGNOSIS — I5042 Chronic combined systolic (congestive) and diastolic (congestive) heart failure: Secondary | ICD-10-CM | POA: Diagnosis not present

## 2022-08-18 DIAGNOSIS — Z4781 Encounter for orthopedic aftercare following surgical amputation: Secondary | ICD-10-CM | POA: Diagnosis not present

## 2022-08-18 DIAGNOSIS — E1165 Type 2 diabetes mellitus with hyperglycemia: Secondary | ICD-10-CM | POA: Diagnosis not present

## 2022-08-18 DIAGNOSIS — L089 Local infection of the skin and subcutaneous tissue, unspecified: Secondary | ICD-10-CM | POA: Diagnosis not present

## 2022-08-18 DIAGNOSIS — R2689 Other abnormalities of gait and mobility: Secondary | ICD-10-CM | POA: Diagnosis not present

## 2022-08-18 DIAGNOSIS — J9611 Chronic respiratory failure with hypoxia: Secondary | ICD-10-CM | POA: Diagnosis not present

## 2022-08-18 DIAGNOSIS — I959 Hypotension, unspecified: Secondary | ICD-10-CM | POA: Diagnosis not present

## 2022-08-18 NOTE — Progress Notes (Signed)
Office Visit Note   Patient: Susan [name]           Date of Birth: May 26, 1951           MRN: [id number] Visit Date: 07/29/2022              Requested by: [name], MD [address] [place],  [place] [address] PCP: [name], MD  Chief Complaint  Patient presents with   Right Leg - Routine Post Op    06/13/2022 right BKA revision       HPI: Patient is a 72 year old woman who presents in follow-up 6 weeks out from revision right below-knee amputation.  She is on nasal cannula FiO2 4 L.  Assessment & Plan: Visit Diagnoses:  1. Dehiscence of amputation stump ([place])   2. Right below-knee amputee [facility])     Plan: Sutures harvested continue with compression Dial soap cleansing reevaluate in 4 weeks.  Follow-Up Instructions: Return in about 4 weeks (around 08/26/2022).   Ortho Exam  Patient is alert, oriented, no adenopathy, well-dressed, normal affect, normal respiratory effort. Examination the wound is well-approximated.  Will harvest the sutures today there is no drainage no cellulitis no signs of infection.  Imaging: No results found.   Labs: Lab Results  Component Value Date   HGBA1C 8.9 (H) 05/02/2022   HGBA1C >15.5 (H) 01/20/2022   HGBA1C >15.5 (H) 01/19/2022   CRP 0.7 08/22/2020   CRP 1.5 (H) 03/13/2020   CRP 1.9 (H) 03/12/2020   LABURIC 4.7 08/09/2020   LABURIC 9.1 (H) 10/12/2018   LABURIC 9.0 (H) 03/06/2018   REPTSTATUS 06/10/2022 FINAL 06/05/2022   GRAMSTAIN  05/05/2022    FEW WBC PRESENT, PREDOMINANTLY PMN NO ORGANISMS SEEN    CULT  06/05/2022    NO GROWTH 5 DAYS Performed at [facility], [place] [address], [place], [place] [address]    LABORGA SERRATIA MARCESCENS 05/05/2022     Lab Results  Component Value Date   ALBUMIN 2.0 (L) 06/16/2022   ALBUMIN 2.2 (L) 06/14/2022   ALBUMIN 2.1 (L) 06/13/2022   PREALBUMIN 13 (L) 05/02/2022    Lab Results  Component Value Date   MG 2.5 (H) 06/06/2022   MG 1.6 (L) 05/29/2022    MG 1.7 05/28/2022   Lab Results  Component Value Date   VD25OH 42.14 05/02/2022    Lab Results  Component Value Date   PREALBUMIN 13 (L) 05/02/2022      Latest Ref Rng & Units 07/31/2022    6:48 AM 06/17/2022    6:58 AM 06/16/2022    9:34 PM  CBC EXTENDED  WBC 4.0 - 10.5 K/uL  9.6  10.6   RBC 3.87 - 5.11 MIL/uL  2.96  3.21   Hemoglobin 12.0 - 15.0 g/dL 11.2  7.8  8.4   HCT 36.0 - 46.0 % 33.0  25.1  27.5   Platelets 150 - 400 K/uL  233  226      There is no height or weight on file to calculate BMI.  Orders:  No orders of the defined types were placed in this encounter.  No orders of the defined types were placed in this encounter.    Procedures: No procedures performed  Clinical Data: No additional findings.  ROS:  All other systems negative, except as noted in the HPI. Review of Systems  Objective: Vital Signs: There were no vitals taken for this visit.  Specialty Comments:  No specialty comments available.  PMFS History: Patient Active Problem List   Diagnosis Date Noted   Constipation 06/07/2022   History of Clostridioides difficile colitis 06/06/2022   Below-knee amputation of right lower extremity ([place]) 06/06/2022   Diverticulitis 06/05/2022   Stercoral colitis 06/05/2022   C. difficile colitis 06/05/2022   Spleen hematoma 06/05/2022   Dehiscence of amputation stump of right lower extremity ([place]) 06/05/2022   Rectal ulcer 05/27/2022   ESRD on dialysis ([place]) 05/27/2022   GI bleed 05/23/2022   Difficult intravenous access 05/23/2022   Gangrene of right foot ([place]) 05/02/2022   S/P BKA (below knee amputation) unilateral, right ([place]) 05/02/2022   Unspecified protein-calorie malnutrition ([place]) 04/15/2022   Secondary hyperparathyroidism of renal origin ([place]) 04/14/2022   Coagulation defect, unspecified ([place]) 04/09/2022   Acquired absence of other left toe(s) ([place]) 04/07/2022   Allergy, unspecified, initial encounter 04/07/2022   Dependence on renal  dialysis ([place]) 04/07/2022   Gout due to renal impairment, unspecified site 04/07/2022   Hypertensive heart and chronic kidney disease with heart failure and with stage 5 chronic kidney disease, or end stage renal disease ([place]) 04/07/2022   Personal history of transient ischemic attack (TIA), and cerebral infarction without residual deficits 04/07/2022   Renal osteodystrophy 04/07/2022   Venous stasis ulcer of right calf ([place]) 03/31/2022   Fistula, colovaginal 03/26/2022   Diarrhea 03/26/2022   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   Sepsis without acute organ dysfunction ([place])    Bacteremia    Acute pancreatitis 02/01/2022   Abdominal pain 02/01/2022   SIRS (systemic inflammatory response syndrome) ([place]) 02/01/2022   Transaminitis 02/01/2022   History of anemia due to chronic kidney disease 02/01/2022   Paroxysmal atrial fibrillation ([place]) 02/01/2022   DKA (diabetic ketoacidosis) ([place]) 01/14/2022   NSTEMI (non-ST elevated myocardial infarction) ([place]) 03/05/2021   Acute renal failure superimposed on stage 4 chronic kidney disease ([place]) 08/22/2020   Hypoalbuminemia 05/25/2020   GERD (gastroesophageal reflux disease) 05/25/2020   Pressure injury of skin 05/17/2020   Type 2 diabetes mellitus with diabetic polyneuropathy, with long-term current use of insulin ([place]) 03/07/2020   Obesity, Class III, BMI 40-49.9 (morbid obesity) ([place]) 03/07/2020   Common bile duct (CBD) obstruction 05/28/2019   Benign neoplasm of ascending colon    Benign neoplasm of transverse colon    Benign neoplasm of descending colon    Benign neoplasm of sigmoid colon    Gastric polyps    Hyperkalemia 03/11/2019   Prolonged QT interval 03/11/2019   Acute blood loss anemia 03/11/2019   Onychomycosis 06/21/2018   Osteomyelitis of second toe of right foot ([place])    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 (HCC)    PVD (peripheral vascular disease) ([place]) 10/26/2017   E-coli UTI 07/27/2017    Hypothyroidism 07/27/2017   AKI (acute kidney injury) ([place])    [place] (pulmonary artery hypertension) ([place])    Impaired ambulation 07/19/2017   Nausea & vomiting 07/15/2017   Leg cramps 02/27/2017   Peripheral edema 01/12/2017   Diabetic neuropathy ([place]) 11/12/2016   CKD (chronic kidney disease), stage IV ([place]) 10/24/2015   Anemia of chronic disease 10/03/2015   Generalized anxiety disorder 10/03/2015   Insomnia 10/03/2015   Hyperglycemia due to diabetes mellitus ([place]) 06/07/2015   Chronic diastolic CHF (congestive heart failure) ([place]) 06/07/2015   Non compliance with medical treatment 04/17/2014   Rotator cuff tear 03/14/2014   Class 3 obesity ([place]) 09/23/2013   Chronic HFrEF (heart failure with reduced ejection fraction) ([place]) 06/03/2013   Hypotension 12/25/2012  Urinary incontinence    MDD (major depressive disorder) 11/12/2010   RBBB (right bundle branch block)    Coronary artery disease    Hyperlipemia 01/22/2009   Essential hypertension 01/22/2009   Past Medical History:  Diagnosis Date   Acute MI ([place]) 1999; 2007; 03/05/21   Anemia    hx   Anginal pain (HCC)    Anxiety    ARF (acute renal failure) ([place]) 06/2017   [place] [facility] Asso   Arthritis    "generalized" (03/15/2014)   CAD (coronary artery disease)    MI in 2000 - MI  2007 - treated bare metal stent (no nuclear since then as 9/11)   Carotid artery disease (HCC)    CHF (congestive heart failure) ([place])    HFrEF 03/06/21   Chronic diastolic heart failure ([place])    a) ECHO (08/2013) EF 55-60% and RV function nl b) RHC (08/2013) RA 4, RV 30/5/7, PA 25/10 (16), PCWP 7, Fick CO/CI 6.3/2.7, PVR 1.5 WU, PA 61 and 66%   Daily headache    "~ every other day; since I fell in June" (03/15/2014)   Depression    Dyslipidemia    Dyspnea    uses oxygen qhs via    ESRD (end stage renal disease) ([place])    Dialysis on Tues Thurs Sat   Exertional shortness of breath    History of kidney stones    HTN (hypertension)     Hypothyroidism    Neuropathy    Obesity    Osteoarthritis    PAF (paroxysmal atrial fibrillation) (HCC)    Peripheral neuropathy    bilateral feet/hands   PONV (postoperative nausea and vomiting)    RBBB (right bundle branch block)    Old   Stroke ([place])    mini strokes   Syncope    likely due to low blood sugar   Tachycardia    Sinus tachycardia   Type II diabetes mellitus (HCC)    Type II, Lamar Sprinkles libre left upper arm. patient has omnipod insulin pump with Novolin R Insulin   Urinary incontinence    Venous insufficiency     Family History  Problem Relation Age of Onset   Heart attack Mother 33    Past Surgical History:  Procedure Laterality Date   ABDOMINAL HYSTERECTOMY  1980's   AMPUTATION Right 02/24/2018   Procedure: RIGHT FOOT GREAT TOE AND 2ND TOE AMPUTATION;  Surgeon: Newt Minion, MD;  Location: [place];  Service: Orthopedics;  Laterality: Right;   AMPUTATION Right 04/30/2018   Procedure: RIGHT TRANSMETATARSAL AMPUTATION;  Surgeon: Newt Minion, MD;  Location: [place];  Service: Orthopedics;  Laterality: Right;   AMPUTATION Right 05/02/2022   Procedure: RIGHT BELOW KNEE AMPUTATION;  Surgeon: Newt Minion, MD;  Location: [place];  Service: Orthopedics;  Laterality: Right;   APPLICATION OF WOUND VAC Right 06/13/2022   Procedure: APPLICATION OF WOUND VAC;  Surgeon: Newt Minion, MD;  Location: [place];  Service: Orthopedics;  Laterality: Right;   AV FISTULA PLACEMENT Left 04/02/2022   Procedure: LEFT ARM ARTERIOVENOUS (AV) FISTULA CREATION;  Surgeon: Serafina Mitchell, MD;  Location: [place];  Service: Vascular;  Laterality: Left;  PERIPHERAL NERVE BLOCK   [place] Left 07/31/2022   Procedure: LEFT ARM SECOND STAGE BASILIC VEIN TRANSPOSITION;  Surgeon: Serafina Mitchell, MD;  Location: [place];  Service: Vascular;  Laterality: Left;   BIOPSY  05/27/2020   Procedure: BIOPSY;  Surgeon: [name], DO;  [facility];  Service: Endoscopy;;   CATARACT  EXTRACTION, BILATERAL Bilateral ?2013   COLONOSCOPY W/ POLYPECTOMY     COLONOSCOPY WITH PROPOFOL N/A 03/13/2019   Procedure: COLONOSCOPY WITH PROPOFOL;  Surgeon: Jerene Bears, MD;  Location: [name];  Service: Gastroenterology;  Laterality: N/A;   CORONARY ANGIOPLASTY WITH STENT PLACEMENT  1999; 2007   "1 + 1"   ERCP N/A 02/03/2022   Procedure: ENDOSCOPIC RETROGRADE CHOLANGIOPANCREATOGRAPHY (ERCP);  Surgeon: Carol Ada, MD;  Location: [place];  Service: Gastroenterology;  Laterality: N/A;   ESOPHAGOGASTRODUODENOSCOPY (EGD) WITH PROPOFOL N/A 03/13/2019   Procedure: ESOPHAGOGASTRODUODENOSCOPY (EGD) WITH PROPOFOL;  Surgeon: Jerene Bears, MD;  Location: [facility] ENDOSCOPY;  Service: Gastroenterology;  Laterality: N/A;   ESOPHAGOGASTRODUODENOSCOPY (EGD) WITH PROPOFOL N/A 05/27/2020   Procedure: ESOPHAGOGASTRODUODENOSCOPY (EGD) WITH PROPOFOL;  Surgeon: [name], DO;  [facility];  Service: Endoscopy;  Laterality: N/A;   EYE SURGERY Bilateral    lazer   FLEXIBLE SIGMOIDOSCOPY N/A 05/23/2022   Procedure: FLEXIBLE SIGMOIDOSCOPY;  Surgeon: Carol Ada, MD;  Location: [place];  Service: Gastroenterology;  Laterality: N/A;   FLEXIBLE SIGMOIDOSCOPY N/A 05/24/2022   Procedure: FLEXIBLE SIGMOIDOSCOPY;  Surgeon: Sharyn Creamer, MD;  Location: [place];  Service: Gastroenterology;  Laterality: N/A;   HEMOSTASIS CLIP PLACEMENT  03/13/2019   Procedure: HEMOSTASIS CLIP PLACEMENT;  Surgeon: Jerene Bears, MD;  Location: [facility] ENDOSCOPY;  Service: Gastroenterology;;   HEMOSTASIS CLIP PLACEMENT  05/23/2022   Procedure: HEMOSTASIS CLIP PLACEMENT;  Surgeon: Carol Ada, MD;  Location: [place];  Service: Gastroenterology;;   HEMOSTASIS CONTROL  05/24/2022   Procedure: HEMOSTASIS CONTROL;  Surgeon: Sharyn Creamer, MD;  Location: [place];  Service: Gastroenterology;;   HOT HEMOSTASIS N/A 05/23/2022   Procedure: HOT HEMOSTASIS (ARGON PLASMA COAGULATION/BICAP);  Surgeon:  Carol Ada, MD;  Location: [place];  Service: Gastroenterology;  Laterality: N/A;   I & D EXTREMITY Left 05/05/2022   Procedure: IRRIGATION AND DEBRIDEMENT LEFT ARM AV FISTULA;  Surgeon: Marty Heck, MD;  Location: [place];  Service: Vascular;  Laterality: Left;   INSERTION OF DIALYSIS CATHETER Right 04/02/2022   Procedure: INSERTION OF TUNNELED DIALYSIS CATHETER;  Surgeon: Serafina Mitchell, MD;  [facility];  Service: Vascular;  Laterality: Right;   KNEE ARTHROSCOPY Left 10/25/2006   POLYPECTOMY  03/13/2019   Procedure: POLYPECTOMY;  Surgeon: Jerene Bears, MD;  Location: [place] ENDOSCOPY;  Service: Gastroenterology;;   REMOVAL OF STONES  02/03/2022   Procedure: REMOVAL OF STONES;  Surgeon: Carol Ada, MD;  Location: [place];  Service: Gastroenterology;;   RIGHT HEART CATH N/A 07/24/2017   Procedure: RIGHT HEART CATH;  Surgeon: Jolaine Artist, MD;  Location: [place] CV LAB;  Service: Cardiovascular;  Laterality: N/A;   RIGHT HEART CATHETERIZATION N/A 09/22/2013   Procedure: RIGHT HEART CATH;  Surgeon: Jolaine Artist, MD;  Location: [facility] CATH LAB;  Service: Cardiovascular;  Laterality: N/A;   SHOULDER ARTHROSCOPY WITH OPEN ROTATOR CUFF REPAIR Right 03/14/2014   Procedure: RIGHT SHOULDER ARTHROSCOPY WITH BICEPS RELEASE, OPEN SUBSCAPULA REPAIR, OPEN SUPRASPINATUS REPAIR.;  Surgeon: Meredith Pel, MD;  Location: [place];  Service: Orthopedics;  Laterality: Right;   SPHINCTEROTOMY  02/03/2022   Procedure: SPHINCTEROTOMY;  Surgeon: Carol Ada, MD;  Location: [place];  Service: Gastroenterology;;   STUMP REVISION Right 06/13/2022   Procedure: REVISION RIGHT BELOW KNEE AMPUTATION;  Surgeon: Newt Minion, MD;  Location: [place];  Service: Orthopedics;  Laterality: Right;   TEE WITHOUT CARDIOVERSION  N/A 02/04/2022   Procedure: TRANSESOPHAGEAL ECHOCARDIOGRAM (TEE);  Surgeon: Jolaine Artist, MD;  Location: [facility] ENDOSCOPY;  Service: Cardiovascular;  Laterality: N/A;    TOE AMPUTATION Right 02/24/2018   GREAT TOE AND 2ND TOE AMPUTATION   TUBAL LIGATION  1970's   Social History   Occupational History    Employer: UNEMPLOYED  Tobacco Use   Smoking status: Former    Packs/day: 3.00    Years: 32.00    Additional pack years: 0.00    Total pack years: 96.00    Types: Cigarettes    Quit date: 10/24/1997    Years since quitting: 24.8   Smokeless tobacco: Never  Vaping Use   Vaping Use: Never used  Substance and Sexual Activity   Alcohol use: Not Currently    Comment: "might have 2-3 daiquiris in the summer"   Drug use: No   Sexual activity: Not Currently    Birth control/protection: Surgical    Comment: Hysterectomy

## 2022-08-18 NOTE — H&P (View-Only) (Signed)
Vascular and Vein Specialist of [facility]  Patient name: Susan [name] MRN: [id number] DOB: [birth year] Sex: female   REASON FOR VISIT:    Follow up  HISOTRY OF PRESENT ILLNESS:    Susan [name] is a 72 y.o. female who underwent for stage left basilic vein fistula creation on 04/02/2022.  I then recently performed second stage on 07/31/2022.  She had an excellent vein.  Unfortunately, this has prematurely occluded.  I suspect that it was last week.   PAST MEDICAL HISTORY:   Past Medical History:  Diagnosis Date   Acute MI ([place]) 1999; 2007; 03/05/21   Anemia    hx   Anginal pain (HCC)    Anxiety    ARF (acute renal failure) ([place]) 06/2017   [place] [facility] Asso   Arthritis    "generalized" (03/15/2014)   CAD (coronary artery disease)    MI in 2000 - MI  2007 - treated bare metal stent (no nuclear since then as 9/11)   Carotid artery disease (HCC)    CHF (congestive heart failure) ([place])    HFrEF 03/06/21   Chronic diastolic heart failure ([place])    a) ECHO (08/2013) EF 55-60% and RV function nl b) RHC (08/2013) RA 4, RV 30/5/7, PA 25/10 (16), PCWP 7, Fick CO/CI 6.3/2.7, PVR 1.5 WU, PA 61 and 66%   Daily headache    "~ every other day; since I fell in June" (03/15/2014)   Depression    Dyslipidemia    Dyspnea    uses oxygen qhs via [place]   ESRD (end stage renal disease) ([place])    Dialysis on Tues Thurs Sat   Exertional shortness of breath    History of kidney stones    HTN (hypertension)    Hypothyroidism    Neuropathy    Obesity    Osteoarthritis    PAF (paroxysmal atrial fibrillation) (HCC)    Peripheral neuropathy    bilateral feet/hands   PONV (postoperative nausea and vomiting)    RBBB (right bundle branch block)    Old   Stroke ([place])    mini strokes   Syncope    likely due to low blood sugar   Tachycardia    Sinus tachycardia   Type II diabetes mellitus (HCC)    Type II, Lamar Sprinkles libre left upper arm. patient has omnipod insulin  pump with Novolin R Insulin   Urinary incontinence    Venous insufficiency      FAMILY HISTORY:   Family History  Problem Relation Age of Onset   Heart attack Mother 46    SOCIAL HISTORY:   Social History   Tobacco Use   Smoking status: Former    Packs/day: 3.00    Years: 32.00    Additional pack years: 0.00    Total pack years: 96.00    Types: Cigarettes    Quit date: 10/24/1997    Years since quitting: 24.8   Smokeless tobacco: Never  Substance Use Topics   Alcohol use: Not Currently    Comment: "might have 2-3 daiquiris in the summer"     ALLERGIES:   Allergies  Allergen Reactions   Cephalexin Diarrhea and Other (See Comments)   Codeine Nausea And Vomiting and Other (See Comments)     CURRENT MEDICATIONS:   Current Outpatient Medications  Medication Sig Dispense Refill   albuterol (VENTOLIN HFA) 108 (90 Base) MCG/ACT inhaler Inhale 1-2 puffs into the lungs every 6 (six) hours as needed for wheezing or shortness of breath.  Amino Acids-Protein Hydrolys (FEEDING SUPPLEMENT, PRO-STAT SUGAR FREE 64,) LIQD Take 30 mLs by mouth in the morning and at bedtime.     amiodarone (PACERONE) 200 MG tablet Take 1 tablet (200 mg total) by mouth daily. 90 tablet 3   aspirin EC 81 MG tablet Take 1 tablet (81 mg total) by mouth daily with breakfast. 30 tablet 11   B Complex-C-Folic Acid (DIALYVITE TABLET) TABS Take 1 tablet by mouth in the morning. (1000)     blood glucose meter kit and supplies Use up to four times daily as directed. 1 each 0   Continuous Blood Gluc Sensor (FREESTYLE LIBRE 2 SENSOR) MISC      diphenoxylate-atropine (LOMOTIL) 2.5-0.025 MG tablet Take 1 tablet by mouth 4 (four) times daily as needed for diarrhea or loose stools. 30 tablet 0   ferrous sulfate 325 (65 FE) MG tablet Take 325 mg by mouth 2 (two) times daily. (1000 & 2100)     Fingerstix Lancets MISC Use as directed to check blood sugars as need up to 4 times daily [id number] each 0   folic acid-vitamin b  complex-vitamin c-selenium-zinc (DIALYVITE) 3 MG TABS tablet Take 1 tablet by mouth daily.     hydrALAZINE (APRESOLINE) 25 MG tablet Take 1 tablet (25 mg total) by mouth every 6 (six) hours as needed (SBP>150 or DBP>100). 90 tablet 1   hydrocortisone (ANUSOL-HC) 2.5 % rectal cream Place 1 Application rectally 2 (two) times daily. (1000 & 2100)     hydrOXYzine (ATARAX) 25 MG tablet Take 25 mg by mouth every 6 (six) hours as needed for anxiety.     insulin aspart (NOVOLOG FLEXPEN) 100 UNIT/ML FlexPen Inject 0-15 Units into the skin See admin instructions. Inject 6 units subcutaneously 3 times daily with meals (0900, 1300 & 1700) & as needed via sliding scale insulin CBG <70=Notify NP/MD 71-149=0 units,150-199=4 units, 200-249=6 units,250- 299=10 units, 300-349=12 units, 350-399=15 units, > 400 call MD ( prime pen with 2 units prior  to set dose)     insulin glargine (LANTUS SOLOSTAR) 100 UNIT/ML Solostar Pen Inject 40 Units into the skin at bedtime. (Patient taking differently: Inject 25 Units into the skin at bedtime.) 15 mL 11   Insulin Pen Needle 32G X 4 MM MISC Use as directed 200 each 0   levothyroxine (SYNTHROID, LEVOTHROID) 50 MCG tablet Take 1 tablet (50 mcg total) by mouth daily before breakfast. 90 tablet 1   linagliptin (TRADJENTA) 5 MG TABS tablet Take 1 tablet (5 mg total) by mouth daily. 30 tablet 0   LORazepam (ATIVAN) 0.5 MG tablet Take 1 tablet (0.5 mg total) by mouth every 8 (eight) hours as needed for anxiety. 30 tablet 0   magnesium oxide (MAG-OX) 400 (240 Mg) MG tablet Take 1 tablet (400 mg total) by mouth daily. 90 tablet 3   midodrine (PROAMATINE) 10 MG tablet Take 10 mg by mouth every Monday, Wednesday, and Friday with hemodialysis.     midodrine (PROAMATINE) 5 MG tablet Take 5 mg by mouth See admin instructions. Take 1 tablet (5 mg) by mouth with meals on Tuesdays, Thursdays, Saturdays & Sundays.     Nutritional Supplements (FEEDING SUPPLEMENT, NEPRO CARB STEADY,) LIQD Take 237  mLs by mouth daily.     ondansetron (ZOFRAN) 4 MG tablet Take 4 mg by mouth every 8 (eight) hours as needed for nausea.     oxyCODONE-acetaminophen (PERCOCET/ROXICET) 5-325 MG tablet Take 1 tablet by mouth every 6 (six) hours as needed. 20 tablet 0  OXYGEN Inhale 2 L/min into the lungs at bedtime.     pantoprazole (PROTONIX) 40 MG tablet Take 1 tablet (40 mg total) by mouth daily. 30 tablet 1   polyethylene glycol (MIRALAX / GLYCOLAX) 17 g packet Take 17 g by mouth 2 (two) times daily. (Patient taking differently: Take 17 g by mouth daily as needed (constipation.).) 14 each 0   pregabalin (LYRICA) 75 MG capsule Take 1 capsule (75 mg total) by mouth daily. 30 capsule 0   Semaglutide (OZEMPIC, 0.25 OR 0.5 MG/DOSE, [place]) Inject 0.25 mg into the skin every Saturday.     senna-docusate (SENOKOT-S) 8.6-50 MG tablet Take 2 tablets by mouth 2 (two) times daily.     sevelamer carbonate (RENVELA) 800 MG tablet Take 1,600 mg by mouth 3 (three) times daily with meals. (0800, 1200 & 1700)     vortioxetine HBr (TRINTELLIX) 20 MG TABS tablet Take 1 tablet (20 mg total) by mouth in the morning. (1000) 30 tablet 5   No current facility-administered medications for this visit.    REVIEW OF SYSTEMS:   [X]  denotes positive finding, [ ]  denotes negative finding Cardiac  Comments:  Chest pain or chest pressure:    Shortness of breath upon exertion:    Short of breath when lying flat:    Irregular heart rhythm:        Vascular    Pain in calf, thigh, or hip brought on by ambulation:    Pain in feet at night that wakes you up from your sleep:     Blood clot in your veins:    Leg swelling:         Pulmonary    Oxygen at home:    Productive cough:     Wheezing:         Neurologic    Sudden weakness in arms or legs:     Sudden numbness in arms or legs:     Sudden onset of difficulty speaking or slurred speech:    Temporary loss of vision in one eye:     Problems with dizziness:         Gastrointestinal     Blood in stool:     Vomited blood:         Genitourinary    Burning when urinating:     Blood in urine:        Psychiatric    Major depression:         Hematologic    Bleeding problems:    Problems with blood clotting too easily:        Skin    Rashes or ulcers:        Constitutional    Fever or chills:      PHYSICAL EXAM:   Vitals:   08/18/22 1005  BP: (!) 152/67  Pulse: 96  Resp: 20  Temp: 98.1 F (36.7 C)  SpO2: 98%  Weight: 213 lb (96.6 kg)  Height: 5\' 5"  (1.651 m)    GENERAL: The patient is a well-nourished female, in no acute distress. The vital signs are documented above. CARDIAC: There is a regular rate and rhythm.  VASCULAR: No audible bruit or palpable thrill within the fistula PULMONARY: Non-labored respiration MUSCULOSKELETAL: There are no major deformities or cyanosis. NEUROLOGIC: No focal weakness or paresthesias are detected. SKIN: There are no ulcers or rashes noted. PSYCHIATRIC: The patient has a normal affect.  STUDIES:     MEDICAL ISSUES:   ESRD: The patient's recent second  stage basilic vein fistula has occluded.  I am unsure as to why this happened.  The vein looked healthy in the operating room.  Her ultrasound suggested a smaller caliber vein up near the shoulder.  This was not encountered at the time of surgery.  I have discussed that I would like to take her back to the operating room to try and perform thrombectomy of her fistula.  I will try to perform a fistulogram as well to see if there is any explanation as to why this occluded.  If I am unable to get it open, I told her I would go ahead and place the graft.  I may try to do this Wednesday    Annamarie Major, IV, MD, FACS [facility] and Vein Specialists of [facility] [phone] Pager [phone]

## 2022-08-18 NOTE — Progress Notes (Signed)
Vascular and Vein Specialist of [facility]  Patient name: Susan [name] MRN: [id number] DOB: 10-Apr-1951 Sex: female   REASON FOR VISIT:    Follow up  HISOTRY OF PRESENT ILLNESS:    Susan [name] is a 72 y.o. female who underwent for stage left basilic vein fistula creation on 04/02/2022.  I then recently performed second stage on 07/31/2022.  She had an excellent vein.  Unfortunately, this has prematurely occluded.  I suspect that it was last week.   PAST MEDICAL HISTORY:   Past Medical History:  Diagnosis Date   Acute MI ([place]) 1999; 2007; 03/05/21   Anemia    hx   Anginal pain (HCC)    Anxiety    ARF (acute renal failure) ([place]) 06/2017   [facility]   Arthritis    "generalized" (03/15/2014)   CAD (coronary artery disease)    MI in 2000 - MI  2007 - treated bare metal stent (no nuclear since then as 9/11)   Carotid artery disease (HCC)    CHF (congestive heart failure) ([place])    HFrEF 03/06/21   Chronic diastolic heart failure ([place])    a) ECHO (08/2013) EF 55-60% and RV function nl b) RHC (08/2013) RA 4, RV 30/5/7, PA 25/10 (16), PCWP 7, Fick CO/CI 6.3/2.7, PVR 1.5 WU, PA 61 and 66%   Daily headache    "~ every other day; since I fell in June" (03/15/2014)   Depression    Dyslipidemia    Dyspnea    uses oxygen qhs via [place]   ESRD (end stage renal disease) ([place])    Dialysis on Tues Thurs Sat   Exertional shortness of breath    History of kidney stones    HTN (hypertension)    Hypothyroidism    Neuropathy    Obesity    Osteoarthritis    PAF (paroxysmal atrial fibrillation) (HCC)    Peripheral neuropathy    bilateral feet/hands   PONV (postoperative nausea and vomiting)    RBBB (right bundle branch block)    Old   Stroke ([place])    mini strokes   Syncope    likely due to low [name]   Tachycardia    Sinus tachycardia   Type II diabetes mellitus (HCC)    Type II, Lamar Sprinkles libre left upper arm. patient has omnipod insulin  pump with Novolin R Insulin   Urinary incontinence    Venous insufficiency      FAMILY HISTORY:   Family History  Problem Relation Age of Onset   Heart attack Mother 71    SOCIAL HISTORY:   Social History   Tobacco Use   Smoking status: Former    Packs/day: 3.00    Years: 32.00    Additional pack years: 0.00    Total pack years: 96.00    Types: Cigarettes    Quit date: 10/24/1997    Years since quitting: 24.8   Smokeless tobacco: Never  Substance Use Topics   Alcohol use: Not Currently    Comment: "might have 2-3 daiquiris in the summer"     ALLERGIES:   Allergies  Allergen Reactions   Cephalexin Diarrhea and Other (See Comments)   Codeine Nausea And Vomiting and Other (See Comments)     CURRENT MEDICATIONS:   Current Outpatient Medications  Medication Sig Dispense Refill   albuterol (VENTOLIN HFA) 108 (90 Base) MCG/ACT inhaler Inhale 1-2 puffs into the lungs every 6 (six) hours as needed for wheezing or shortness of breath.  Amino Acids-Protein Hydrolys (FEEDING SUPPLEMENT, PRO-STAT SUGAR FREE 64,) LIQD Take 30 mLs by mouth in the morning and at bedtime.     amiodarone (PACERONE) 200 MG tablet Take 1 tablet (200 mg total) by mouth daily. 90 tablet 3   aspirin EC 81 MG tablet Take 1 tablet (81 mg total) by mouth daily with breakfast. 30 tablet 11   B Complex-C-Folic Acid (DIALYVITE TABLET) TABS Take 1 tablet by mouth in the morning. (1000)     [name] glucose meter kit and supplies Use up to four times daily as directed. 1 each 0   Continuous [name] Gluc Sensor (FREESTYLE LIBRE 2 SENSOR) MISC      diphenoxylate-atropine (LOMOTIL) 2.5-0.025 MG tablet Take 1 tablet by mouth 4 (four) times daily as needed for diarrhea or loose stools. 30 tablet 0   ferrous sulfate 325 (65 FE) MG tablet Take 325 mg by mouth 2 (two) times daily. (1000 & 2100)     Fingerstix Lancets MISC Use as directed to check [name] sugars as need up to 4 times daily [id number] each 0   folic acid-vitamin b  complex-vitamin c-selenium-zinc (DIALYVITE) 3 MG TABS tablet Take 1 tablet by mouth daily.     hydrALAZINE (APRESOLINE) 25 MG tablet Take 1 tablet (25 mg total) by mouth every 6 (six) hours as needed (SBP>150 or DBP>100). 90 tablet 1   hydrocortisone (ANUSOL-HC) 2.5 % rectal cream Place 1 Application rectally 2 (two) times daily. (1000 & 2100)     hydrOXYzine (ATARAX) 25 MG tablet Take 25 mg by mouth every 6 (six) hours as needed for anxiety.     insulin aspart (NOVOLOG FLEXPEN) 100 UNIT/ML FlexPen Inject 0-15 Units into the skin See admin instructions. Inject 6 units subcutaneously 3 times daily with meals (0900, 1300 & 1700) & as needed via sliding scale insulin CBG <70=Notify NP/MD 71-149=0 units,150-199=4 units, 200-249=6 units,250- 299=10 units, 300-349=12 units, 350-399=15 units, > 400 call MD ( prime pen with 2 units prior  to set dose)     insulin glargine (LANTUS SOLOSTAR) 100 UNIT/ML Solostar Pen Inject 40 Units into the skin at bedtime. (Patient taking differently: Inject 25 Units into the skin at bedtime.) 15 mL 11   Insulin Pen Needle 32G X 4 MM MISC Use as directed 200 each 0   levothyroxine (SYNTHROID, LEVOTHROID) 50 MCG tablet Take 1 tablet (50 mcg total) by mouth daily before breakfast. 90 tablet 1   linagliptin (TRADJENTA) 5 MG TABS tablet Take 1 tablet (5 mg total) by mouth daily. 30 tablet 0   LORazepam (ATIVAN) 0.5 MG tablet Take 1 tablet (0.5 mg total) by mouth every 8 (eight) hours as needed for anxiety. 30 tablet 0   magnesium oxide (MAG-OX) 400 (240 Mg) MG tablet Take 1 tablet (400 mg total) by mouth daily. 90 tablet 3   midodrine (PROAMATINE) 10 MG tablet Take 10 mg by mouth every Monday, Wednesday, and Friday with hemodialysis.     midodrine (PROAMATINE) 5 MG tablet Take 5 mg by mouth See admin instructions. Take 1 tablet (5 mg) by mouth with meals on Tuesdays, Thursdays, Saturdays & Sundays.     Nutritional Supplements (FEEDING SUPPLEMENT, NEPRO CARB STEADY,) LIQD Take 237  mLs by mouth daily.     ondansetron (ZOFRAN) 4 MG tablet Take 4 mg by mouth every 8 (eight) hours as needed for nausea.     oxyCODONE-acetaminophen (PERCOCET/ROXICET) 5-325 MG tablet Take 1 tablet by mouth every 6 (six) hours as needed. 20 tablet 0  OXYGEN Inhale 2 L/min into the lungs at bedtime.     pantoprazole (PROTONIX) 40 MG tablet Take 1 tablet (40 mg total) by mouth daily. 30 tablet 1   polyethylene glycol (MIRALAX / GLYCOLAX) 17 g packet Take 17 g by mouth 2 (two) times daily. (Patient taking differently: Take 17 g by mouth daily as needed (constipation.).) 14 each 0   pregabalin (LYRICA) 75 MG capsule Take 1 capsule (75 mg total) by mouth daily. 30 capsule 0   Semaglutide (OZEMPIC, 0.25 OR 0.5 MG/DOSE, [place]) Inject 0.25 mg into the skin every Saturday.     senna-docusate (SENOKOT-S) 8.6-50 MG tablet Take 2 tablets by mouth 2 (two) times daily.     sevelamer carbonate (RENVELA) 800 MG tablet Take 1,600 mg by mouth 3 (three) times daily with meals. (0800, 1200 & 1700)     vortioxetine HBr (TRINTELLIX) 20 MG TABS tablet Take 1 tablet (20 mg total) by mouth in the morning. (1000) 30 tablet 5   No current facility-administered medications for this visit.    REVIEW OF SYSTEMS:   [X]  denotes positive finding, [ ]  denotes negative finding Cardiac  Comments:  Chest pain or chest pressure:    Shortness of breath upon exertion:    Short of breath when lying flat:    Irregular heart rhythm:        Vascular    Pain in calf, thigh, or hip brought on by ambulation:    Pain in feet at night that wakes you up from your sleep:     [name] clot in your veins:    Leg swelling:         Pulmonary    Oxygen at home:    Productive cough:     Wheezing:         Neurologic    Sudden weakness in arms or legs:     Sudden numbness in arms or legs:     Sudden onset of difficulty speaking or slurred speech:    Temporary loss of vision in one eye:     Problems with dizziness:         Gastrointestinal     [name] in stool:     Vomited [name]:         Genitourinary    Burning when urinating:     [name] in urine:        Psychiatric    Major depression:         Hematologic    Bleeding problems:    Problems with [name] clotting too easily:        Skin    Rashes or ulcers:        Constitutional    Fever or chills:      PHYSICAL EXAM:   Vitals:   08/18/22 1005  BP: (!) 152/67  Pulse: 96  Resp: 20  Temp: 98.1 F (36.7 C)  SpO2: 98%  Weight: 213 lb (96.6 kg)  Height: 5\' 5"  (1.651 m)    GENERAL: The patient is a well-nourished female, in no acute distress. The vital signs are documented above. CARDIAC: There is a regular rate and rhythm.  VASCULAR: No audible bruit or palpable thrill within the fistula PULMONARY: Non-labored respiration MUSCULOSKELETAL: There are no major deformities or cyanosis. NEUROLOGIC: No focal weakness or paresthesias are detected. SKIN: There are no ulcers or rashes noted. PSYCHIATRIC: The patient has a normal affect.  STUDIES:     MEDICAL ISSUES:   ESRD: The patient's recent second  stage basilic vein fistula has occluded.  I am unsure as to why this happened.  The vein looked healthy in the operating room.  Her ultrasound suggested a smaller caliber vein up near the shoulder.  This was not encountered at the time of surgery.  I have discussed that I would like to take her back to the operating room to try and perform thrombectomy of her fistula.  I will try to perform a fistulogram as well to see if there is any explanation as to why this occluded.  If I am unable to get it open, I told her I would go ahead and place the graft.  I may try to do this Wednesday    Annamarie Major, IV, MD, FACS Vascular and Vein Specialists of [facility] [phone] Pager [phone]

## 2022-08-19 ENCOUNTER — Other Ambulatory Visit: Payer: Self-pay

## 2022-08-19 ENCOUNTER — Encounter (HOSPITAL_COMMUNITY): Payer: Self-pay | Admitting: Surgery

## 2022-08-19 DIAGNOSIS — Z23 Encounter for immunization: Secondary | ICD-10-CM | POA: Diagnosis not present

## 2022-08-19 DIAGNOSIS — N2581 Secondary hyperparathyroidism of renal origin: Secondary | ICD-10-CM | POA: Diagnosis not present

## 2022-08-19 DIAGNOSIS — Z992 Dependence on renal dialysis: Secondary | ICD-10-CM | POA: Diagnosis not present

## 2022-08-19 DIAGNOSIS — D509 Iron deficiency anemia, unspecified: Secondary | ICD-10-CM | POA: Diagnosis not present

## 2022-08-19 DIAGNOSIS — N186 End stage renal disease: Secondary | ICD-10-CM | POA: Diagnosis not present

## 2022-08-19 DIAGNOSIS — D689 Coagulation defect, unspecified: Secondary | ICD-10-CM | POA: Diagnosis not present

## 2022-08-19 DIAGNOSIS — D631 Anemia in chronic kidney disease: Secondary | ICD-10-CM | POA: Diagnosis not present

## 2022-08-19 NOTE — Pre-Procedure Instructions (Addendum)
    Susan [name]  08/19/2022    [name] Stucky's procedure is scheduled on 08/20/22.  Report to [facility] Admitting at 9:30 AM.  Call this number if you have problems the morning of surgery:  [phone] Pre- surgery desk.  If you experience any cold or flu symptoms such as cough, fever, chills, shortness of breath, etc. between now and your scheduled surgery, please notify us at the above number.  >>>>>Please send patient's Medication Record with medications administrated documentation. ( this information is required prior to OR. This includes medications that may have been on hold for surgery)<<<<<    Remember:  Do not eat or drink after midnight.  Take these medicines the morning of surgery with A SIP OF WATER : amiodarone (PACERONE)  levothyroxine (SYNTHROID)  Midodrine vortioxetine HBr (TRINTELLIX)  Aspirin Pantoprazole pregabalin (LYRICA)   PRN: hydrALAZINE (APRESOLINE)  albuterol (VENTOLIN) inhaler diphenoxylate-atropine (LOMOTIL)  ondansetron (ZOFRAN)  oxyCODONE-acetaminophen (PERCOCET/ROXICET)  Medications for diabetes: Lantus 1/2 dose 20 units- if in am only give if CBG is greater than 70. AM: If your CBG is greater than 220 mg/dL, you may take  of your sliding scale (correction) dose of insulin.  How to Manage Your Diabetes Before and After Surgery  Check [name]'s blood sugar the morning of your If your blood sugar is less than 70 mg/dL, you will need to treat for low blood sugar: Do not take insulin. Treat a low blood sugar (less than 70 mg/dL) with  cup of clear juice (cranberry or apple), 4 glucose tablets, OR glucose gel. Recheck blood sugar in 15 minutes after treatment (to make sure it is greater than 70 mg/dL). If your blood sugar is not greater than 70 mg/dL on recheck, call [phone]  for further instructions.    [name] should have a shower, with antibacteria soap, the morning of surgery.  Dry off with a clean towel.  Patient  should not have lotions, powders, colognes, deodorant, jewelry, or piercing's. Wear clean comfortable clothes.  Brush teeth.   Contacts, dentures or bridgework may not be worn into surgery.    [name] is not responsible for any belongings or valuables, leave at the facility if she can manage without them.

## 2022-08-19 NOTE — Progress Notes (Addendum)
Ms Ince is in rehab at [facility]. I called and spoke with Monett, patient's nurse. Monett reported that Mrs Girdley is alert and oriented x 4. Mrs Diffey is lifted out of bed using a hoyer.Monett reports  that  Mrs Guindon has a stage 3 pressure on the left side of the sacrum.  Ms Mcbrayer has type II diabetes, Monett reports that her morning CBG have recently been in the 300's, before they ranged around 220.  I typed instructions and sent to [facility], Mrs. [facility] nurse. I confirmed that fax was received.  Monet reports that Mrs Placeres doe snot have s/s of Covid, has not been tested in the past 10 days and has no exposure of Covid to her knowledge. Monett also stated that patient has not had any s/s of a lower or upper respiratoy infection in the past 8 weeks.

## 2022-08-20 ENCOUNTER — Ambulatory Visit (HOSPITAL_COMMUNITY): Payer: PPO | Admitting: Certified Registered"

## 2022-08-20 ENCOUNTER — Other Ambulatory Visit: Payer: Self-pay

## 2022-08-20 ENCOUNTER — Ambulatory Visit (HOSPITAL_COMMUNITY): Payer: PPO

## 2022-08-20 ENCOUNTER — Encounter (HOSPITAL_COMMUNITY)
Admission: RE | Disposition: A | Discharge status: Home / Self Care | Payer: Self-pay | Source: Home / Self Care | Attending: Surgery

## 2022-08-20 ENCOUNTER — Encounter (HOSPITAL_COMMUNITY): Payer: Self-pay | Admitting: Surgery

## 2022-08-20 ENCOUNTER — Ambulatory Visit (HOSPITAL_COMMUNITY)
Admission: RE | Admit: 2022-08-20 | Discharge: 2022-08-20 | Disposition: A | Discharge status: Home / Self Care | Payer: PPO | Attending: Surgery | Admitting: Surgery

## 2022-08-20 ENCOUNTER — Ambulatory Visit (HOSPITAL_BASED_OUTPATIENT_CLINIC_OR_DEPARTMENT_OTHER): Payer: PPO | Admitting: Certified Registered"

## 2022-08-20 DIAGNOSIS — D631 Anemia in chronic kidney disease: Secondary | ICD-10-CM

## 2022-08-20 DIAGNOSIS — E1122 Type 2 diabetes mellitus with diabetic chronic kidney disease: Secondary | ICD-10-CM | POA: Diagnosis not present

## 2022-08-20 DIAGNOSIS — Z794 Long term (current) use of insulin: Secondary | ICD-10-CM

## 2022-08-20 DIAGNOSIS — I509 Heart failure, unspecified: Secondary | ICD-10-CM

## 2022-08-20 DIAGNOSIS — I2510 Atherosclerotic heart disease of native coronary artery without angina pectoris: Secondary | ICD-10-CM | POA: Diagnosis not present

## 2022-08-20 DIAGNOSIS — T82868A Thrombosis of vascular prosthetic devices, implants and grafts, initial encounter: Secondary | ICD-10-CM | POA: Diagnosis not present

## 2022-08-20 DIAGNOSIS — Z992 Dependence on renal dialysis: Secondary | ICD-10-CM | POA: Diagnosis not present

## 2022-08-20 DIAGNOSIS — N185 Chronic kidney disease, stage 5: Secondary | ICD-10-CM | POA: Diagnosis not present

## 2022-08-20 DIAGNOSIS — N186 End stage renal disease: Secondary | ICD-10-CM | POA: Diagnosis not present

## 2022-08-20 DIAGNOSIS — I5032 Chronic diastolic (congestive) heart failure: Secondary | ICD-10-CM | POA: Insufficient documentation

## 2022-08-20 DIAGNOSIS — Z87891 Personal history of nicotine dependence: Secondary | ICD-10-CM | POA: Diagnosis not present

## 2022-08-20 DIAGNOSIS — I132 Hypertensive heart and chronic kidney disease with heart failure and with stage 5 chronic kidney disease, or end stage renal disease: Secondary | ICD-10-CM | POA: Insufficient documentation

## 2022-08-20 HISTORY — PX: REVISON OF ARTERIOVENOUS FISTULA: SHX6074

## 2022-08-20 HISTORY — PX: THROMBECTOMY W/ EMBOLECTOMY: SHX2507

## 2022-08-20 LAB — POCT I-STAT, CHEM 8
BUN: 40 mg/dL — ABNORMAL HIGH (ref 8–23)
Calcium, Ion: 1.16 mmol/L (ref 1.15–1.40)
Chloride: 96 mmol/L — ABNORMAL LOW (ref 98–111)
Creatinine, Ser: 4.6 mg/dL — ABNORMAL HIGH (ref 0.44–1.00)
Glucose, Bld: 237 mg/dL — ABNORMAL HIGH (ref 70–99)
HCT: 33 % — ABNORMAL LOW (ref 36.0–46.0)
Hemoglobin: 11.2 g/dL — ABNORMAL LOW (ref 12.0–15.0)
Potassium: 3.9 mmol/L (ref 3.5–5.1)
Sodium: 131 mmol/L — ABNORMAL LOW (ref 135–145)
TCO2: 28 mmol/L (ref 22–32)

## 2022-08-20 LAB — GLUCOSE, CAPILLARY
Glucose-Capillary: 200 mg/dL — ABNORMAL HIGH (ref 70–99)
Glucose-Capillary: 248 mg/dL — ABNORMAL HIGH (ref 70–99)
Glucose-Capillary: 193 mg/dL — ABNORMAL HIGH (ref 70–99)

## 2022-08-20 SURGERY — THROMBECTOMY ARTERIOVENOUS FISTULA
Anesthesia: General | Site: Arm Upper | Laterality: Left

## 2022-08-20 MED ORDER — HEPARIN SODIUM (PORCINE) 1000 UNIT/ML IJ SOLN
INTRAMUSCULAR | Status: AC
  Filled 2022-08-20: qty 10

## 2022-08-20 MED ORDER — OXYCODONE HCL 5 MG/5ML PO SOLN: 5.0000 mg | Freq: Once | ORAL | Status: DC | PRN

## 2022-08-20 MED ORDER — LIDOCAINE 2% (20 MG/ML) 5 ML SYRINGE
INTRAMUSCULAR | Status: DC | PRN
  Administered 2022-08-20: 60 mg via INTRAVENOUS

## 2022-08-20 MED ORDER — HEPARIN 6000 UNIT IRRIGATION SOLUTION
Status: AC
  Filled 2022-08-20: qty 500

## 2022-08-20 MED ORDER — SODIUM CHLORIDE 0.9 % IV SOLN: INTRAVENOUS | Status: DC

## 2022-08-20 MED ORDER — OXYCODONE-ACETAMINOPHEN 5-325 MG PO TABS: 1.0000 | ORAL_TABLET | Freq: Four times a day (QID) | ORAL | 0 refills | Status: DC | PRN

## 2022-08-20 MED ORDER — SUGAMMADEX SODIUM 200 MG/2ML IV SOLN
INTRAVENOUS | Status: DC | PRN
  Administered 2022-08-20: 200 mg via INTRAVENOUS

## 2022-08-20 MED ORDER — VANCOMYCIN HCL IN DEXTROSE 1-5 GM/200ML-% IV SOLN
1000.0000 mg | INTRAVENOUS | Status: AC
  Administered 2022-08-20: 1000 mg via INTRAVENOUS
  Filled 2022-08-20: qty 200

## 2022-08-20 MED ORDER — STERILE WATER FOR IRRIGATION IR SOLN
Status: DC | PRN
  Administered 2022-08-20: 1000 mL

## 2022-08-20 MED ORDER — DEXAMETHASONE SODIUM PHOSPHATE 10 MG/ML IJ SOLN
INTRAMUSCULAR | Status: AC
  Filled 2022-08-20: qty 1

## 2022-08-20 MED ORDER — FENTANYL CITRATE (PF) 250 MCG/5ML IJ SOLN
INTRAMUSCULAR | Status: DC | PRN
  Administered 2022-08-20: 25 ug via INTRAVENOUS
  Administered 2022-08-20 (×2): 50 ug via INTRAVENOUS

## 2022-08-20 MED ORDER — 0.9 % SODIUM CHLORIDE (POUR BTL) OPTIME
TOPICAL | Status: DC | PRN
  Administered 2022-08-20: 1000 mL

## 2022-08-20 MED ORDER — INSULIN ASPART 100 UNIT/ML IJ SOLN
0.0000 [IU] | INTRAMUSCULAR | Status: AC | PRN
  Administered 2022-08-20: 2 [IU] via SUBCUTANEOUS
  Administered 2022-08-20: 3 [IU] via SUBCUTANEOUS
  Filled 2022-08-20: qty 1

## 2022-08-20 MED ORDER — ACETAMINOPHEN 10 MG/ML IV SOLN: 1000.0000 mg | Freq: Once | INTRAVENOUS | Status: DC | PRN

## 2022-08-20 MED ORDER — ONDANSETRON HCL 4 MG/2ML IJ SOLN
INTRAMUSCULAR | Status: AC
  Filled 2022-08-20: qty 2

## 2022-08-20 MED ORDER — FENTANYL CITRATE (PF) 100 MCG/2ML IJ SOLN: 25.0000 ug | INTRAMUSCULAR | Status: DC | PRN

## 2022-08-20 MED ORDER — ACETAMINOPHEN 500 MG PO TABS: 1000.0000 mg | ORAL_TABLET | Freq: Once | ORAL | Status: DC | PRN

## 2022-08-20 MED ORDER — DEXAMETHASONE SODIUM PHOSPHATE 10 MG/ML IJ SOLN
INTRAMUSCULAR | Status: DC | PRN
  Administered 2022-08-20: 4 mg via INTRAVENOUS

## 2022-08-20 MED ORDER — ONDANSETRON HCL 4 MG/2ML IJ SOLN
INTRAMUSCULAR | Status: DC | PRN
  Administered 2022-08-20: 4 mg via INTRAVENOUS

## 2022-08-20 MED ORDER — HEPARIN SODIUM (PORCINE) 1000 UNIT/ML IJ SOLN
INTRAMUSCULAR | Status: DC | PRN
  Administered 2022-08-20: 2000 [IU] via INTRAVENOUS
  Administered 2022-08-20: 7000 [IU] via INTRAVENOUS

## 2022-08-20 MED ORDER — PROTAMINE SULFATE 10 MG/ML IV SOLN
INTRAVENOUS | Status: AC
  Filled 2022-08-20: qty 5

## 2022-08-20 MED ORDER — PROPOFOL 10 MG/ML IV BOLUS
INTRAVENOUS | Status: AC
  Filled 2022-08-20: qty 20

## 2022-08-20 MED ORDER — CHLORHEXIDINE GLUCONATE 4 % EX LIQD: 60.0000 mL | Freq: Once | CUTANEOUS | Status: DC

## 2022-08-20 MED ORDER — ACETAMINOPHEN 160 MG/5ML PO SOLN: 1000.0000 mg | Freq: Once | ORAL | Status: DC | PRN

## 2022-08-20 MED ORDER — ORAL CARE MOUTH RINSE: 15.0000 mL | Freq: Once | OROMUCOSAL | Status: AC

## 2022-08-20 MED ORDER — OXYCODONE HCL 5 MG PO TABS: 5.0000 mg | ORAL_TABLET | Freq: Once | ORAL | Status: DC | PRN

## 2022-08-20 MED ORDER — ROCURONIUM BROMIDE 10 MG/ML (PF) SYRINGE
PREFILLED_SYRINGE | INTRAVENOUS | Status: DC | PRN
  Administered 2022-08-20: 60 mg via INTRAVENOUS
  Administered 2022-08-20: 10 mg via INTRAVENOUS

## 2022-08-20 MED ORDER — PROPOFOL 10 MG/ML IV BOLUS
INTRAVENOUS | Status: DC | PRN
  Administered 2022-08-20: 90 mg via INTRAVENOUS
  Administered 2022-08-20: 20 mg via INTRAVENOUS

## 2022-08-20 MED ORDER — CHLORHEXIDINE GLUCONATE 0.12 % MT SOLN
15.0000 mL | Freq: Once | OROMUCOSAL | Status: AC
  Administered 2022-08-20: 15 mL via OROMUCOSAL
  Filled 2022-08-20: qty 15

## 2022-08-20 MED ORDER — LIDOCAINE-EPINEPHRINE (PF) 1 %-1:200000 IJ SOLN
INTRAMUSCULAR | Status: AC
  Filled 2022-08-20: qty 30

## 2022-08-20 MED ORDER — PROTAMINE SULFATE 10 MG/ML IV SOLN
INTRAVENOUS | Status: DC | PRN
  Administered 2022-08-20: 50 mg via INTRAVENOUS

## 2022-08-20 MED ORDER — HEMOSTATIC AGENTS (NO CHARGE) OPTIME
TOPICAL | Status: DC | PRN
  Administered 2022-08-20: 1 via TOPICAL

## 2022-08-20 MED ORDER — HEPARIN 6000 UNIT IRRIGATION SOLUTION
Status: DC | PRN
  Administered 2022-08-20: 1

## 2022-08-20 MED ORDER — PHENYLEPHRINE HCL-NACL 20-0.9 MG/250ML-% IV SOLN
INTRAVENOUS | Status: DC | PRN
  Administered 2022-08-20: 20 ug/min via INTRAVENOUS

## 2022-08-20 MED ORDER — FENTANYL CITRATE (PF) 250 MCG/5ML IJ SOLN
INTRAMUSCULAR | Status: AC
  Filled 2022-08-20: qty 5

## 2022-08-20 SURGICAL SUPPLY — 44 items
ADH SKN CLS APL DERMABOND .7 (GAUZE/BANDAGES/DRESSINGS) ×1
AGENT HMST KT MTR STRL THRMB (HEMOSTASIS) ×1
ARMBAND PINK RESTRICT EXTREMIT (MISCELLANEOUS) ×2 IMPLANT
BAG COUNTER SPONGE SURGICOUNT (BAG) ×1 IMPLANT
BAG SPNG CNTER NS LX DISP (BAG) ×1
CANISTER SUCT 3000ML PPV (MISCELLANEOUS) ×1 IMPLANT
CATH EMB 4FR 40 (CATHETERS) IMPLANT
CATH EMB 4FR 80 (CATHETERS) ×1 IMPLANT
CLIP TI MEDIUM 6 (CLIP) ×1 IMPLANT
CLIP TI WIDE RED SMALL 6 (CLIP) ×1 IMPLANT
CNTNR URN SCR LID CUP LEK RST (MISCELLANEOUS) IMPLANT
CONT SPEC 4OZ STRL OR WHT (MISCELLANEOUS) ×1
COVER DOME SNAP 22 D (MISCELLANEOUS) IMPLANT
COVER PROBE W GEL 5X96 (DRAPES) IMPLANT
DERMABOND ADVANCED .7 DNX12 (GAUZE/BANDAGES/DRESSINGS) ×1 IMPLANT
ELECT REM PT RETURN 9FT ADLT (ELECTROSURGICAL) ×1
ELECTRODE REM PT RTRN 9FT ADLT (ELECTROSURGICAL) ×1 IMPLANT
GLOVE SURG SS PI 7.5 STRL IVOR (GLOVE) ×3 IMPLANT
GOWN STRL REUS W/ TWL LRG LVL3 (GOWN DISPOSABLE) ×2 IMPLANT
GOWN STRL REUS W/ TWL XL LVL3 (GOWN DISPOSABLE) ×1 IMPLANT
GOWN STRL REUS W/TWL LRG LVL3 (GOWN DISPOSABLE) ×2
GOWN STRL REUS W/TWL XL LVL3 (GOWN DISPOSABLE) ×1
KIT BASIN OR (CUSTOM PROCEDURE TRAY) ×1 IMPLANT
KIT TURNOVER KIT B (KITS) ×1 IMPLANT
LOOP VASCULAR MINI 18 RED (MISCELLANEOUS) ×1
NS IRRIG 1000ML POUR BTL (IV SOLUTION) ×1 IMPLANT
PACK CV ACCESS (CUSTOM PROCEDURE TRAY) ×1 IMPLANT
PAD ARMBOARD 7.5X6 YLW CONV (MISCELLANEOUS) ×2 IMPLANT
SET MICROPUNCTURE 5F STIFF (MISCELLANEOUS) IMPLANT
STOPCOCK MORSE 400PSI 3WAY (MISCELLANEOUS) IMPLANT
SURGIFLO W/THROMBIN 8M KIT (HEMOSTASIS) IMPLANT
SUT PROLENE 6 0 BV (SUTURE) ×1 IMPLANT
SUT VIC AB 3-0 SH 27 (SUTURE) ×1
SUT VIC AB 3-0 SH 27X BRD (SUTURE) ×1 IMPLANT
SUT VICRYL 4-0 PS2 18IN ABS (SUTURE) IMPLANT
SYR 10ML LL (SYRINGE) IMPLANT
SYR 3ML LL SCALE MARK (SYRINGE) IMPLANT
TOWEL GREEN STERILE (TOWEL DISPOSABLE) ×1 IMPLANT
TUBING CIL FLEX 10 FLL-RA (TUBING) IMPLANT
TUBING EXTENTION W/L.L. (IV SETS) IMPLANT
UNDERPAD 30X36 HEAVY ABSORB (UNDERPADS AND DIAPERS) ×1 IMPLANT
VASCULAR TIE MINI RED 18IN STL (MISCELLANEOUS) IMPLANT
WATER STERILE IRR 1000ML POUR (IV SOLUTION) ×1 IMPLANT
WIRE BENTSON .035X145CM (WIRE) IMPLANT

## 2022-08-20 NOTE — Progress Notes (Signed)
During pre procedural suicide assessment, patient answered yes to questions one, two, three, and five making her high risk. Dr. Ermalene Postin and Dr. Trula Slade made aware of patient status. Patient states she is not having these thoughts as of today. Patient also states that " when she is out of the facility she feels much better and less depressed." Patient does not want to speak with anyone at this time

## 2022-08-20 NOTE — Anesthesia Procedure Notes (Signed)
Procedure Name: Intubation Date/Time: 08/20/2022 11:55 AM  Performed by: Leonor Liv, CRNAPre-anesthesia Checklist: Patient identified, Emergency Drugs available, Suction available and Patient being monitored Patient Re-evaluated:Patient Re-evaluated prior to induction Oxygen Delivery Method: Circle System Utilized Preoxygenation: Pre-oxygenation with 100% oxygen Induction Type: IV induction Ventilation: Mask ventilation without difficulty and Oral airway inserted - appropriate to patient size Laryngoscope Size: Mac and 3 Grade View: Grade I Tube type: Oral Tube size: 7.0 mm Number of attempts: 1 Airway Equipment and Method: Stylet and Oral airway Placement Confirmation: ETT inserted through vocal cords under direct vision, positive ETCO2 and breath sounds checked- equal and bilateral Secured at: 21 cm Tube secured with: Tape Dental Injury: Teeth and Oropharynx as per pre-operative assessment

## 2022-08-20 NOTE — OR Nursing (Signed)
Left AV fistula thrombectomy and fistula revision was done on 08/20/2022 by Dr. Trula Slade.

## 2022-08-20 NOTE — Anesthesia Preprocedure Evaluation (Signed)
Anesthesia Evaluation  Patient identified by MRN, date of birth, ID band Patient awake    Reviewed: Allergy & Precautions, NPO status , Patient's Chart, lab work & pertinent test results  History of Anesthesia Complications (+) PONV and history of anesthetic complications  Airway Mallampati: III  TM Distance: >3 FB Neck ROM: Full    Dental  (+) Dental Advisory Given,    Pulmonary shortness of breath, former smoker   breath sounds clear to auscultation       Cardiovascular hypertension, (-) angina + CAD, + Past MI, + Cardiac Stents, + Peripheral Vascular Disease and +CHF  (-) dysrhythmias  Rhythm:Regular     Neuro/Psych  Headaches  Anxiety Depression     Neuromuscular disease CVA    GI/Hepatic Neg liver ROS, PUD,GERD  ,,  Endo/Other  diabetes, Insulin DependentHypothyroidism  Lab Results      Component                Value               Date                      HGBA1C                   8.9 (H)             05/02/2022             Renal/GU ESRF and DialysisRenal diseaseLab Results      Component                Value               Date                      CREATININE               4.60 (H)            08/20/2022            Lab Results      Component                Value               Date                      K                        3.9                 08/20/2022                Musculoskeletal   Abdominal   Peds  Hematology  (+) Blood dyscrasia, anemia Lab Results      Component                Value               Date                      WBC                      9.6                 06/17/2022  HGB                      11.2 (L)            08/20/2022                HCT                      33.0 (L)            08/20/2022                MCV                      84.8                06/17/2022                PLT                      233                 06/17/2022              Anesthesia Other Findings    Reproductive/Obstetrics                             Anesthesia Physical Anesthesia Plan  ASA: 4  Anesthesia Plan: General   Post-op Pain Management:    Induction:   PONV Risk Score and Plan: 4 or greater and Ondansetron and Dexamethasone  Airway Management Planned: Oral ETT  Additional Equipment: None  Intra-op Plan:   Post-operative Plan: Extubation in OR  Informed Consent: I have reviewed the patients History and Physical, chart, labs and discussed the procedure including the risks, benefits and alternatives for the proposed anesthesia with the patient or authorized representative who has indicated his/her understanding and acceptance.     Dental advisory given  Plan Discussed with: CRNA  Anesthesia Plan Comments:        Anesthesia Quick Evaluation

## 2022-08-20 NOTE — Op Note (Signed)
Patient name: Susan [name] MRN: [id number] DOB: February 01, 1951 Sex: female  08/20/2022 Pre-operative Diagnosis: ESRD Post-operative diagnosis:  Same Surgeon:  Annamarie Major Assistants:  Ivin Booty, PA Procedure:   #1: Revision of left basilic vein fistula   #2: Thrombectomy of basilic vein fistula Anesthesia:  General Blood Loss:  minimal Specimens:  none  Findings: Sclerotic proximal fistula.  I suspect this is why it occluded as the lumen in the proximal portion of the fistula is very small.  I respected the disease portion of the fistula and was left with a healthy appearing basilic vein.  A new arterial venous anastomosis was then created more proximal to the previous.  There is a good thrill after the case.  Indications: This is a 72 year old female who recently underwent a second stage basilic vein fistula creation.  Sometime last week this occluded.  I saw her in the office Monday.  She comes in today for thrombectomy and revision versus a new upper arm graft.  Procedure:  The patient was identified in the holding area and taken to [place] [facility]  The patient was then placed supine on the table. general anesthesia was administered.  The patient was prepped and draped in the usual sterile fashion.  A time out was called and antibiotics were administered.  A PA was necessary to expedite the procedure and assist with technical details.  She provided exposure via suction and retraction.  She help with the anastomosis by following the suture.  She help with wound closure.  The patient's previous longitudinal incision near the antecubital crease was opened with a 10 blade.  Previous suture was then cut with scissors.  I then exposed the basilic vein fistula.  Once I had adequately mobilized, the patient was fully heparinized.  I made a transverse venotomy.  There was fresh thrombus within the basilic vein.  A #4 Fogarty catheter was used to perform thrombectomy of the fistula.  Several passes were  performed evacuating all thrombus.  The fistula then flushed easily.  I then tried to thrombectomize the proximal fistula as well as the arterial venous anastomosis.  The vein here was relatively diseased.  It appeared to be sclerotic.  Even after thrombectomy there was minimal flow through the vein.  I felt that this was likely the reason for occlusion of the fistula.  The remaining distal portion of the fistula however did appear of adequate diameter and disease-free.  I then elected to expose the brachial artery more proximal from the previous anastomosis.  Once I had this fully mobilized and exposed, it was occluded with vascular clamps.  I then made an arteriotomy with an 11 blade and extended this with Potts scissors.  I created a new arterial venous anastomosis with a running 6-0 Prolene.  Once this was completed, blood flow was reestablished to the fistula.  There was an excellent thrill within the fistula.  The vein was under a little bit of tension, and so I did not elect to shoot a fistulogram because I was worried about the vein tearing.  She may require a fistulogram later if she is having difficulty with cannulation, however I was the proximal portion of the fistula was disease and this likely was the reason for fistula occlusion.  At this point, hemostasis was achieved.  The wound was irrigated.  The incision was then closed with 2 layers of Vicryl followed by Dermabond.  The patient was taken recovery in stable condition.   Disposition: To  PACU stable.   Theotis Burrow, M.D., [facility] V[facility] and Vein Specialists of [place] Office: [phone] Pager:  [phone]

## 2022-08-20 NOTE — Interval H&P Note (Signed)
History and Physical Interval Note:  08/20/2022 9:11 AM  [name]  has presented today for surgery, with the diagnosis of ESRD.  The various methods of treatment have been discussed with the patient and family. After consideration of risks, benefits and other options for treatment, the patient has consented to  Procedure(s): THROMBECTOMY OF LEFT ARM ARTERIOVENOUS FISTULA VERSUS NEW ARTERIOVENOUS GORETEX GRAFT PLACEMENT (Left) as a surgical intervention.  The patient's history has been reviewed, patient examined, no change in status, stable for surgery.  I have reviewed the patient's chart and labs.  Questions were answered to the patient's satisfaction.     Annamarie Major

## 2022-08-20 NOTE — Transfer of Care (Signed)
Immediate Anesthesia Transfer of Care Note  Patient: Susan [name]  Procedure(s) Performed: THROMBECTOMY OF LEFT ARM ARTERIOVENOUS FISTULA (Left: Arm Upper) REVISON OF LEFT ARM ARTERIOVENOUS FISTULA (Left: Arm Upper)  Patient Location: PACU  Anesthesia Type:General  Level of Consciousness: awake, alert , and oriented  Airway & Oxygen Therapy: Patient Spontanous Breathing and Patient connected to nasal cannula oxygen  Post-op Assessment: Report given to RN and Post -op Vital signs reviewed and stable  Post vital signs: Reviewed and stable  Last Vitals:  Vitals Value Taken Time  BP 131/60 08/20/22 1335  Temp    Pulse 87 08/20/22 1339  Resp 14 08/20/22 1339  SpO2 99 % 08/20/22 1339  Vitals shown include unvalidated device data.  Last Pain:  Vitals:   08/20/22 0952  PainSc: 9          Complications: No notable events documented.

## 2022-08-20 NOTE — Discharge Instructions (Addendum)
[facility]  Discharge Instructions  AV Fistula or Graft Surgery for Dialysis Access  Please refer to the following instructions for your post-procedure care. Your surgeon or physician assistant will discuss any changes with you.  Activity  You may drive the day following your surgery, if you are comfortable and no longer taking prescription pain medication. Resume full activity as the soreness in your incision resolves.  Bathing/Showering  You may shower after you go home. Keep your incision dry for 48 hours. Do not soak in a bathtub, hot tub, or swim until the incision heals completely. You may not shower if you have a hemodialysis catheter.  Incision Care  Clean your incision with mild soap and water after 48 hours. Pat the area dry with a clean towel. You do not need a bandage unless otherwise instructed. Do not apply any ointments or creams to your incision. You may have skin glue on your incision. Do not peel it off. It will come off on its own in about one week. Your arm may swell a bit after surgery. To reduce swelling use pillows to elevate your arm so it is above your heart. Your doctor will tell you if you need to lightly wrap your arm with an ACE bandage.  Diet  Resume your normal diet. There are not special food restrictions following this procedure. In order to heal from your surgery, it is CRITICAL to get adequate nutrition. Your body requires vitamins, minerals, and protein. Vegetables are the best source of vitamins and minerals. Vegetables also provide the perfect balance of protein. Processed food has little nutritional value, so try to avoid this.  Medications  Resume taking all of your medications. If your incision is causing pain, you may take over-the counter pain relievers such as acetaminophen (Tylenol). If you were prescribed a stronger pain medication, please be aware these medications can cause nausea and constipation. Prevent  nausea by taking the medication with a snack or meal. Avoid constipation by drinking plenty of fluids and eating foods with high amount of fiber, such as fruits, vegetables, and grains.  Do not take Tylenol if you are taking prescription pain medications.  Follow up Your surgeon may want to see you in the office following your access surgery. If so, this will be arranged at the time of your surgery.  Please call us immediately for any of the following conditions:  Increased pain, redness, drainage (pus) from your incision site Fever of 101 degrees or higher Severe or worsening pain at your incision site Hand pain or numbness.  Reduce your risk of vascular disease:  Stop smoking. If you would like help, call [facility] at 1-800-QUIT-NOW 640-760-0732) or [place] [facility] [place] your cholesterol Maintain a desired weight Control your diabetes Keep your blood pressure down  Dialysis  It will take several weeks to several months for your new dialysis access to be ready for use. Your surgeon will determine when it is okay to use it. Your nephrologist will continue to direct your dialysis. You can continue to use your Permcath until your new access is ready for use.   08/20/2022 Susan [name] [id number] 04/19/1951  Surgeon(s): Serafina Mitchell, MD  Procedure(s): THROMBECTOMY OF LEFT ARM ARTERIOVENOUS FISTULA REVISON OF LEFT ARM ARTERIOVENOUS FISTULA   May stick graft immediately   May stick graft on designated area only:   X Do not stick Left AV fistula for 4 weeks    If you have any  questions, please call the office at [phone].

## 2022-08-21 ENCOUNTER — Encounter (HOSPITAL_COMMUNITY): Payer: Self-pay | Admitting: Surgery

## 2022-08-21 DIAGNOSIS — J9611 Chronic respiratory failure with hypoxia: Secondary | ICD-10-CM | POA: Diagnosis not present

## 2022-08-21 DIAGNOSIS — I5042 Chronic combined systolic (congestive) and diastolic (congestive) heart failure: Secondary | ICD-10-CM | POA: Diagnosis not present

## 2022-08-21 DIAGNOSIS — I959 Hypotension, unspecified: Secondary | ICD-10-CM | POA: Diagnosis not present

## 2022-08-21 DIAGNOSIS — Z992 Dependence on renal dialysis: Secondary | ICD-10-CM | POA: Diagnosis not present

## 2022-08-21 DIAGNOSIS — R2689 Other abnormalities of gait and mobility: Secondary | ICD-10-CM | POA: Diagnosis not present

## 2022-08-21 DIAGNOSIS — Z4781 Encounter for orthopedic aftercare following surgical amputation: Secondary | ICD-10-CM | POA: Diagnosis not present

## 2022-08-21 DIAGNOSIS — M6281 Muscle weakness (generalized): Secondary | ICD-10-CM | POA: Diagnosis not present

## 2022-08-22 NOTE — Anesthesia Postprocedure Evaluation (Signed)
Anesthesia Post Note  Patient: Susan [name]  Procedure(s) Performed: THROMBECTOMY OF LEFT ARM ARTERIOVENOUS FISTULA (Left: Arm Upper) REVISON OF LEFT ARM ARTERIOVENOUS FISTULA (Left: Arm Upper)     Patient location during evaluation: PACU Anesthesia Type: General Level of consciousness: awake and alert Pain management: pain level controlled Vital Signs Assessment: post-procedure vital signs reviewed and stable Respiratory status: spontaneous breathing, nonlabored ventilation and respiratory function stable Cardiovascular status: blood pressure returned to baseline and stable Postop Assessment: no apparent nausea or vomiting Anesthetic complications: no   No notable events documented.  Last Vitals:  Vitals:   08/20/22 1445 08/20/22 1500  BP: (!) 109/55 104/63  Pulse: 89 80  Resp: 11 11  Temp:  37 C  SpO2: 98% 98%    Last Pain:  Vitals:   08/20/22 1500  PainSc: 0-No pain                 Shadow Stiggers

## 2022-08-24 DIAGNOSIS — Z992 Dependence on renal dialysis: Secondary | ICD-10-CM | POA: Diagnosis not present

## 2022-08-24 DIAGNOSIS — N186 End stage renal disease: Secondary | ICD-10-CM | POA: Diagnosis not present

## 2022-08-24 DIAGNOSIS — N179 Acute kidney failure, unspecified: Secondary | ICD-10-CM | POA: Diagnosis not present

## 2022-08-25 DIAGNOSIS — D689 Coagulation defect, unspecified: Secondary | ICD-10-CM | POA: Diagnosis not present

## 2022-08-25 DIAGNOSIS — J9611 Chronic respiratory failure with hypoxia: Secondary | ICD-10-CM | POA: Diagnosis not present

## 2022-08-25 DIAGNOSIS — Z992 Dependence on renal dialysis: Secondary | ICD-10-CM | POA: Diagnosis not present

## 2022-08-25 DIAGNOSIS — R2689 Other abnormalities of gait and mobility: Secondary | ICD-10-CM | POA: Diagnosis not present

## 2022-08-25 DIAGNOSIS — L89223 Pressure ulcer of left hip, stage 3: Secondary | ICD-10-CM | POA: Diagnosis not present

## 2022-08-25 DIAGNOSIS — I5042 Chronic combined systolic (congestive) and diastolic (congestive) heart failure: Secondary | ICD-10-CM | POA: Diagnosis not present

## 2022-08-25 DIAGNOSIS — I959 Hypotension, unspecified: Secondary | ICD-10-CM | POA: Diagnosis not present

## 2022-08-25 DIAGNOSIS — Z4781 Encounter for orthopedic aftercare following surgical amputation: Secondary | ICD-10-CM | POA: Diagnosis not present

## 2022-08-25 DIAGNOSIS — L89153 Pressure ulcer of sacral region, stage 3: Secondary | ICD-10-CM | POA: Diagnosis not present

## 2022-08-25 DIAGNOSIS — N186 End stage renal disease: Secondary | ICD-10-CM | POA: Diagnosis not present

## 2022-08-25 DIAGNOSIS — M6281 Muscle weakness (generalized): Secondary | ICD-10-CM | POA: Diagnosis not present

## 2022-08-25 DIAGNOSIS — E1165 Type 2 diabetes mellitus with hyperglycemia: Secondary | ICD-10-CM | POA: Diagnosis not present

## 2022-08-25 DIAGNOSIS — D509 Iron deficiency anemia, unspecified: Secondary | ICD-10-CM | POA: Diagnosis not present

## 2022-08-25 DIAGNOSIS — N2581 Secondary hyperparathyroidism of renal origin: Secondary | ICD-10-CM | POA: Diagnosis not present

## 2022-08-26 ENCOUNTER — Inpatient Hospital Stay (HOSPITAL_COMMUNITY)
Admission: EM | Admit: 2022-08-26 | Discharge: 2022-09-05 | DRG: 385 | Disposition: A | Discharge status: Skilled Nursing Facility | Payer: PPO | Attending: Internal Medicine | Admitting: Internal Medicine

## 2022-08-26 ENCOUNTER — Ambulatory Visit (INDEPENDENT_AMBULATORY_CARE_PROVIDER_SITE_OTHER): Payer: PPO | Admitting: Orthopedic Surgery

## 2022-08-26 DIAGNOSIS — R000 Tachycardia, unspecified: Secondary | ICD-10-CM | POA: Diagnosis present

## 2022-08-26 DIAGNOSIS — N390 Urinary tract infection, site not specified: Secondary | ICD-10-CM | POA: Diagnosis present

## 2022-08-26 DIAGNOSIS — I5043 Acute on chronic combined systolic (congestive) and diastolic (congestive) heart failure: Secondary | ICD-10-CM | POA: Diagnosis not present

## 2022-08-26 DIAGNOSIS — Z992 Dependence on renal dialysis: Secondary | ICD-10-CM

## 2022-08-26 DIAGNOSIS — I252 Old myocardial infarction: Secondary | ICD-10-CM

## 2022-08-26 DIAGNOSIS — N250 Renal osteodystrophy: Secondary | ICD-10-CM | POA: Diagnosis present

## 2022-08-26 DIAGNOSIS — R1084 Generalized abdominal pain: Secondary | ICD-10-CM | POA: Diagnosis not present

## 2022-08-26 DIAGNOSIS — R933 Abnormal findings on diagnostic imaging of other parts of digestive tract: Secondary | ICD-10-CM | POA: Diagnosis not present

## 2022-08-26 DIAGNOSIS — I5022 Chronic systolic (congestive) heart failure: Secondary | ICD-10-CM | POA: Diagnosis not present

## 2022-08-26 DIAGNOSIS — D631 Anemia in chronic kidney disease: Secondary | ICD-10-CM | POA: Diagnosis present

## 2022-08-26 DIAGNOSIS — K8020 Calculus of gallbladder without cholecystitis without obstruction: Secondary | ICD-10-CM | POA: Diagnosis not present

## 2022-08-26 DIAGNOSIS — Z90710 Acquired absence of both cervix and uterus: Secondary | ICD-10-CM

## 2022-08-26 DIAGNOSIS — K5130 Ulcerative (chronic) rectosigmoiditis without complications: Principal | ICD-10-CM | POA: Diagnosis present

## 2022-08-26 DIAGNOSIS — N186 End stage renal disease: Secondary | ICD-10-CM | POA: Diagnosis present

## 2022-08-26 DIAGNOSIS — E039 Hypothyroidism, unspecified: Secondary | ICD-10-CM | POA: Diagnosis present

## 2022-08-26 DIAGNOSIS — Z8673 Personal history of transient ischemic attack (TIA), and cerebral infarction without residual deficits: Secondary | ICD-10-CM

## 2022-08-26 DIAGNOSIS — E871 Hypo-osmolality and hyponatremia: Secondary | ICD-10-CM | POA: Diagnosis not present

## 2022-08-26 DIAGNOSIS — R1011 Right upper quadrant pain: Secondary | ICD-10-CM | POA: Diagnosis not present

## 2022-08-26 DIAGNOSIS — K828 Other specified diseases of gallbladder: Secondary | ICD-10-CM | POA: Diagnosis not present

## 2022-08-26 DIAGNOSIS — B3781 Candidal esophagitis: Secondary | ICD-10-CM | POA: Diagnosis not present

## 2022-08-26 DIAGNOSIS — Z89511 Acquired absence of right leg below knee: Secondary | ICD-10-CM

## 2022-08-26 DIAGNOSIS — I9589 Other hypotension: Secondary | ICD-10-CM | POA: Diagnosis present

## 2022-08-26 DIAGNOSIS — D735 Infarction of spleen: Secondary | ICD-10-CM | POA: Diagnosis present

## 2022-08-26 DIAGNOSIS — R2689 Other abnormalities of gait and mobility: Secondary | ICD-10-CM | POA: Diagnosis not present

## 2022-08-26 DIAGNOSIS — R109 Unspecified abdominal pain: Secondary | ICD-10-CM | POA: Diagnosis not present

## 2022-08-26 DIAGNOSIS — K8070 Calculus of gallbladder and bile duct without cholecystitis without obstruction: Secondary | ICD-10-CM | POA: Diagnosis present

## 2022-08-26 DIAGNOSIS — I480 Paroxysmal atrial fibrillation: Secondary | ICD-10-CM | POA: Diagnosis present

## 2022-08-26 DIAGNOSIS — I5021 Acute systolic (congestive) heart failure: Secondary | ICD-10-CM | POA: Diagnosis present

## 2022-08-26 DIAGNOSIS — Z8249 Family history of ischemic heart disease and other diseases of the circulatory system: Secondary | ICD-10-CM

## 2022-08-26 DIAGNOSIS — E1152 Type 2 diabetes mellitus with diabetic peripheral angiopathy with gangrene: Secondary | ICD-10-CM | POA: Diagnosis present

## 2022-08-26 DIAGNOSIS — E785 Hyperlipidemia, unspecified: Secondary | ICD-10-CM | POA: Diagnosis present

## 2022-08-26 DIAGNOSIS — N280 Ischemia and infarction of kidney: Secondary | ICD-10-CM | POA: Diagnosis present

## 2022-08-26 DIAGNOSIS — I120 Hypertensive chronic kidney disease with stage 5 chronic kidney disease or end stage renal disease: Secondary | ICD-10-CM | POA: Diagnosis not present

## 2022-08-26 DIAGNOSIS — I4892 Unspecified atrial flutter: Secondary | ICD-10-CM | POA: Diagnosis present

## 2022-08-26 DIAGNOSIS — I498 Other specified cardiac arrhythmias: Secondary | ICD-10-CM | POA: Diagnosis not present

## 2022-08-26 DIAGNOSIS — E876 Hypokalemia: Secondary | ICD-10-CM | POA: Diagnosis present

## 2022-08-26 DIAGNOSIS — R008 Other abnormalities of heart beat: Secondary | ICD-10-CM | POA: Diagnosis not present

## 2022-08-26 DIAGNOSIS — K229 Disease of esophagus, unspecified: Secondary | ICD-10-CM | POA: Diagnosis not present

## 2022-08-26 DIAGNOSIS — E11649 Type 2 diabetes mellitus with hypoglycemia without coma: Secondary | ICD-10-CM | POA: Diagnosis not present

## 2022-08-26 DIAGNOSIS — M6281 Muscle weakness (generalized): Secondary | ICD-10-CM | POA: Diagnosis not present

## 2022-08-26 DIAGNOSIS — D696 Thrombocytopenia, unspecified: Secondary | ICD-10-CM | POA: Diagnosis present

## 2022-08-26 DIAGNOSIS — I959 Hypotension, unspecified: Secondary | ICD-10-CM | POA: Diagnosis not present

## 2022-08-26 DIAGNOSIS — Z79890 Hormone replacement therapy (postmenopausal): Secondary | ICD-10-CM

## 2022-08-26 DIAGNOSIS — R1312 Dysphagia, oropharyngeal phase: Secondary | ICD-10-CM | POA: Diagnosis not present

## 2022-08-26 DIAGNOSIS — I700 Atherosclerosis of aorta: Secondary | ICD-10-CM | POA: Diagnosis not present

## 2022-08-26 DIAGNOSIS — I4891 Unspecified atrial fibrillation: Secondary | ICD-10-CM | POA: Diagnosis not present

## 2022-08-26 DIAGNOSIS — Z7984 Long term (current) use of oral hypoglycemic drugs: Secondary | ICD-10-CM

## 2022-08-26 DIAGNOSIS — Z885 Allergy status to narcotic agent status: Secondary | ICD-10-CM

## 2022-08-26 DIAGNOSIS — Z955 Presence of coronary angioplasty implant and graft: Secondary | ICD-10-CM

## 2022-08-26 DIAGNOSIS — I2510 Atherosclerotic heart disease of native coronary artery without angina pectoris: Secondary | ICD-10-CM | POA: Diagnosis not present

## 2022-08-26 DIAGNOSIS — I739 Peripheral vascular disease, unspecified: Secondary | ICD-10-CM | POA: Diagnosis not present

## 2022-08-26 DIAGNOSIS — Z87891 Personal history of nicotine dependence: Secondary | ICD-10-CM

## 2022-08-26 DIAGNOSIS — E1165 Type 2 diabetes mellitus with hyperglycemia: Secondary | ICD-10-CM | POA: Diagnosis not present

## 2022-08-26 DIAGNOSIS — I509 Heart failure, unspecified: Secondary | ICD-10-CM | POA: Diagnosis not present

## 2022-08-26 DIAGNOSIS — I472 Ventricular tachycardia, unspecified: Secondary | ICD-10-CM | POA: Diagnosis not present

## 2022-08-26 DIAGNOSIS — I5042 Chronic combined systolic (congestive) and diastolic (congestive) heart failure: Secondary | ICD-10-CM | POA: Diagnosis not present

## 2022-08-26 DIAGNOSIS — E1142 Type 2 diabetes mellitus with diabetic polyneuropathy: Secondary | ICD-10-CM | POA: Diagnosis present

## 2022-08-26 DIAGNOSIS — N2581 Secondary hyperparathyroidism of renal origin: Secondary | ICD-10-CM | POA: Diagnosis present

## 2022-08-26 DIAGNOSIS — E869 Volume depletion, unspecified: Secondary | ICD-10-CM | POA: Diagnosis present

## 2022-08-26 DIAGNOSIS — D6869 Other thrombophilia: Secondary | ICD-10-CM | POA: Diagnosis present

## 2022-08-26 DIAGNOSIS — I132 Hypertensive heart and chronic kidney disease with heart failure and with stage 5 chronic kidney disease, or end stage renal disease: Secondary | ICD-10-CM | POA: Diagnosis present

## 2022-08-26 DIAGNOSIS — R41841 Cognitive communication deficit: Secondary | ICD-10-CM | POA: Diagnosis not present

## 2022-08-26 DIAGNOSIS — R531 Weakness: Secondary | ICD-10-CM | POA: Diagnosis not present

## 2022-08-26 DIAGNOSIS — R112 Nausea with vomiting, unspecified: Secondary | ICD-10-CM | POA: Diagnosis not present

## 2022-08-26 DIAGNOSIS — R1013 Epigastric pain: Secondary | ICD-10-CM | POA: Diagnosis not present

## 2022-08-26 DIAGNOSIS — R197 Diarrhea, unspecified: Secondary | ICD-10-CM | POA: Diagnosis not present

## 2022-08-26 DIAGNOSIS — E1122 Type 2 diabetes mellitus with diabetic chronic kidney disease: Secondary | ICD-10-CM | POA: Diagnosis present

## 2022-08-26 DIAGNOSIS — Z7982 Long term (current) use of aspirin: Secondary | ICD-10-CM

## 2022-08-26 DIAGNOSIS — Z87442 Personal history of urinary calculi: Secondary | ICD-10-CM

## 2022-08-26 DIAGNOSIS — R0602 Shortness of breath: Secondary | ICD-10-CM | POA: Diagnosis not present

## 2022-08-26 DIAGNOSIS — L89152 Pressure ulcer of sacral region, stage 2: Secondary | ICD-10-CM | POA: Diagnosis present

## 2022-08-26 DIAGNOSIS — Z7401 Bed confinement status: Secondary | ICD-10-CM | POA: Diagnosis not present

## 2022-08-26 DIAGNOSIS — Z79899 Other long term (current) drug therapy: Secondary | ICD-10-CM

## 2022-08-26 DIAGNOSIS — E1310 Other specified diabetes mellitus with ketoacidosis without coma: Secondary | ICD-10-CM | POA: Diagnosis not present

## 2022-08-26 DIAGNOSIS — I69828 Other speech and language deficits following other cerebrovascular disease: Secondary | ICD-10-CM | POA: Diagnosis not present

## 2022-08-26 DIAGNOSIS — I110 Hypertensive heart disease with heart failure: Secondary | ICD-10-CM | POA: Diagnosis not present

## 2022-08-26 DIAGNOSIS — Z881 Allergy status to other antibiotic agents status: Secondary | ICD-10-CM

## 2022-08-26 DIAGNOSIS — Z794 Long term (current) use of insulin: Secondary | ICD-10-CM | POA: Diagnosis not present

## 2022-08-26 DIAGNOSIS — J9611 Chronic respiratory failure with hypoxia: Secondary | ICD-10-CM | POA: Diagnosis not present

## 2022-08-26 DIAGNOSIS — E8770 Fluid overload, unspecified: Secondary | ICD-10-CM | POA: Diagnosis present

## 2022-08-26 DIAGNOSIS — R251 Tremor, unspecified: Secondary | ICD-10-CM | POA: Diagnosis not present

## 2022-08-26 DIAGNOSIS — I950 Idiopathic hypotension: Secondary | ICD-10-CM | POA: Diagnosis not present

## 2022-08-26 DIAGNOSIS — K5730 Diverticulosis of large intestine without perforation or abscess without bleeding: Secondary | ICD-10-CM | POA: Diagnosis not present

## 2022-08-26 DIAGNOSIS — I484 Atypical atrial flutter: Secondary | ICD-10-CM | POA: Diagnosis not present

## 2022-08-26 LAB — CBC WITH DIFFERENTIAL/PLATELET
Abs Immature Granulocytes: 0.05 10*3/uL (ref 0.00–0.07)
Basophils Absolute: 0 10*3/uL (ref 0.0–0.1)
Basophils Relative: 0 %
Eosinophils Absolute: 0.1 10*3/uL (ref 0.0–0.5)
Eosinophils Relative: 1 %
HCT: 35.3 % — ABNORMAL LOW (ref 36.0–46.0)
Hemoglobin: 10.3 g/dL — ABNORMAL LOW (ref 12.0–15.0)
Immature Granulocytes: 1 %
Lymphocytes Relative: 5 %
Lymphs Abs: 0.5 10*3/uL — ABNORMAL LOW (ref 0.7–4.0)
MCH: 23.1 pg — ABNORMAL LOW (ref 26.0–34.0)
MCHC: 29.2 g/dL — ABNORMAL LOW (ref 30.0–36.0)
MCV: 79.1 fL — ABNORMAL LOW (ref 80.0–100.0)
Monocytes Absolute: 0.4 10*3/uL (ref 0.1–1.0)
Monocytes Relative: 5 %
Neutro Abs: 8.1 10*3/uL — ABNORMAL HIGH (ref 1.7–7.7)
Neutrophils Relative %: 88 %
Platelets: 100 10*3/uL — ABNORMAL LOW (ref 150–400)
RBC: 4.46 MIL/uL (ref 3.87–5.11)
RDW: 20.7 % — ABNORMAL HIGH (ref 11.5–15.5)
WBC: 9.2 10*3/uL (ref 4.0–10.5)
nRBC: 0 % (ref 0.0–0.2)

## 2022-08-26 LAB — COMPREHENSIVE METABOLIC PANEL
ALT: 14 U/L (ref 0–44)
AST: 18 U/L (ref 15–41)
Albumin: 2.8 g/dL — ABNORMAL LOW (ref 3.5–5.0)
Alkaline Phosphatase: 155 U/L — ABNORMAL HIGH (ref 38–126)
Anion gap: 18 — ABNORMAL HIGH (ref 5–15)
BUN: 30 mg/dL — ABNORMAL HIGH (ref 8–23)
CO2: 22 mmol/L (ref 22–32)
Calcium: 9.1 mg/dL (ref 8.9–10.3)
Chloride: 95 mmol/L — ABNORMAL LOW (ref 98–111)
Creatinine, Ser: 3.44 mg/dL — ABNORMAL HIGH (ref 0.44–1.00)
GFR, Estimated: 14 mL/min — ABNORMAL LOW (ref >60)
Glucose, Bld: 208 mg/dL — ABNORMAL HIGH (ref 70–99)
Potassium: 2.8 mmol/L — ABNORMAL LOW (ref 3.5–5.1)
Sodium: 135 mmol/L (ref 135–145)
Total Bilirubin: 0.8 mg/dL (ref 0.3–1.2)
Total Protein: 6.4 g/dL — ABNORMAL LOW (ref 6.5–8.1)

## 2022-08-26 LAB — TROPONIN I (HIGH SENSITIVITY): Troponin I (High Sensitivity): 21 ng/L — ABNORMAL HIGH (ref <18)

## 2022-08-26 NOTE — ED Triage Notes (Signed)
Pt BIBA from SNF with epigastric pain, N/V. 4mg  of zofran given prior to arrival.

## 2022-08-27 ENCOUNTER — Inpatient Hospital Stay (HOSPITAL_COMMUNITY): Payer: PPO

## 2022-08-27 ENCOUNTER — Emergency Department (HOSPITAL_COMMUNITY): Payer: PPO

## 2022-08-27 ENCOUNTER — Encounter (HOSPITAL_COMMUNITY): Payer: Self-pay | Admitting: Family Medicine

## 2022-08-27 DIAGNOSIS — I132 Hypertensive heart and chronic kidney disease with heart failure and with stage 5 chronic kidney disease, or end stage renal disease: Secondary | ICD-10-CM | POA: Diagnosis present

## 2022-08-27 DIAGNOSIS — R1013 Epigastric pain: Secondary | ICD-10-CM | POA: Diagnosis not present

## 2022-08-27 DIAGNOSIS — D735 Infarction of spleen: Secondary | ICD-10-CM | POA: Diagnosis present

## 2022-08-27 DIAGNOSIS — R109 Unspecified abdominal pain: Secondary | ICD-10-CM | POA: Diagnosis not present

## 2022-08-27 DIAGNOSIS — D6869 Other thrombophilia: Secondary | ICD-10-CM | POA: Diagnosis present

## 2022-08-27 DIAGNOSIS — R933 Abnormal findings on diagnostic imaging of other parts of digestive tract: Secondary | ICD-10-CM | POA: Diagnosis not present

## 2022-08-27 DIAGNOSIS — K229 Disease of esophagus, unspecified: Secondary | ICD-10-CM | POA: Diagnosis not present

## 2022-08-27 DIAGNOSIS — I5022 Chronic systolic (congestive) heart failure: Secondary | ICD-10-CM | POA: Diagnosis not present

## 2022-08-27 DIAGNOSIS — N186 End stage renal disease: Secondary | ICD-10-CM | POA: Diagnosis present

## 2022-08-27 DIAGNOSIS — I120 Hypertensive chronic kidney disease with stage 5 chronic kidney disease or end stage renal disease: Secondary | ICD-10-CM | POA: Diagnosis not present

## 2022-08-27 DIAGNOSIS — I9589 Other hypotension: Secondary | ICD-10-CM | POA: Diagnosis present

## 2022-08-27 DIAGNOSIS — I2510 Atherosclerotic heart disease of native coronary artery without angina pectoris: Secondary | ICD-10-CM | POA: Diagnosis not present

## 2022-08-27 DIAGNOSIS — R197 Diarrhea, unspecified: Secondary | ICD-10-CM | POA: Diagnosis not present

## 2022-08-27 DIAGNOSIS — N2581 Secondary hyperparathyroidism of renal origin: Secondary | ICD-10-CM | POA: Diagnosis present

## 2022-08-27 DIAGNOSIS — E1142 Type 2 diabetes mellitus with diabetic polyneuropathy: Secondary | ICD-10-CM | POA: Diagnosis present

## 2022-08-27 DIAGNOSIS — R008 Other abnormalities of heart beat: Secondary | ICD-10-CM | POA: Diagnosis not present

## 2022-08-27 DIAGNOSIS — I484 Atypical atrial flutter: Secondary | ICD-10-CM | POA: Diagnosis not present

## 2022-08-27 DIAGNOSIS — B3781 Candidal esophagitis: Secondary | ICD-10-CM | POA: Diagnosis not present

## 2022-08-27 DIAGNOSIS — I4892 Unspecified atrial flutter: Secondary | ICD-10-CM | POA: Diagnosis present

## 2022-08-27 DIAGNOSIS — R000 Tachycardia, unspecified: Secondary | ICD-10-CM | POA: Diagnosis not present

## 2022-08-27 DIAGNOSIS — N280 Ischemia and infarction of kidney: Secondary | ICD-10-CM | POA: Diagnosis present

## 2022-08-27 DIAGNOSIS — E1122 Type 2 diabetes mellitus with diabetic chronic kidney disease: Secondary | ICD-10-CM | POA: Diagnosis present

## 2022-08-27 DIAGNOSIS — Z992 Dependence on renal dialysis: Secondary | ICD-10-CM | POA: Diagnosis not present

## 2022-08-27 DIAGNOSIS — E039 Hypothyroidism, unspecified: Secondary | ICD-10-CM | POA: Diagnosis present

## 2022-08-27 DIAGNOSIS — D631 Anemia in chronic kidney disease: Secondary | ICD-10-CM | POA: Diagnosis present

## 2022-08-27 DIAGNOSIS — D696 Thrombocytopenia, unspecified: Secondary | ICD-10-CM | POA: Diagnosis present

## 2022-08-27 DIAGNOSIS — R112 Nausea with vomiting, unspecified: Secondary | ICD-10-CM | POA: Diagnosis not present

## 2022-08-27 DIAGNOSIS — E11649 Type 2 diabetes mellitus with hypoglycemia without coma: Secondary | ICD-10-CM | POA: Diagnosis not present

## 2022-08-27 DIAGNOSIS — I5043 Acute on chronic combined systolic (congestive) and diastolic (congestive) heart failure: Secondary | ICD-10-CM | POA: Diagnosis not present

## 2022-08-27 DIAGNOSIS — E1165 Type 2 diabetes mellitus with hyperglycemia: Secondary | ICD-10-CM | POA: Diagnosis not present

## 2022-08-27 DIAGNOSIS — I480 Paroxysmal atrial fibrillation: Secondary | ICD-10-CM | POA: Diagnosis present

## 2022-08-27 DIAGNOSIS — E1152 Type 2 diabetes mellitus with diabetic peripheral angiopathy with gangrene: Secondary | ICD-10-CM | POA: Diagnosis present

## 2022-08-27 DIAGNOSIS — E871 Hypo-osmolality and hyponatremia: Secondary | ICD-10-CM | POA: Diagnosis not present

## 2022-08-27 DIAGNOSIS — N390 Urinary tract infection, site not specified: Secondary | ICD-10-CM | POA: Diagnosis present

## 2022-08-27 DIAGNOSIS — I472 Ventricular tachycardia, unspecified: Secondary | ICD-10-CM | POA: Diagnosis not present

## 2022-08-27 DIAGNOSIS — K5130 Ulcerative (chronic) rectosigmoiditis without complications: Secondary | ICD-10-CM | POA: Diagnosis present

## 2022-08-27 DIAGNOSIS — L89152 Pressure ulcer of sacral region, stage 2: Secondary | ICD-10-CM | POA: Diagnosis present

## 2022-08-27 DIAGNOSIS — I5021 Acute systolic (congestive) heart failure: Secondary | ICD-10-CM | POA: Diagnosis present

## 2022-08-27 LAB — CBC
HCT: 36.4 % (ref 36.0–46.0)
HCT: 32.8 % — ABNORMAL LOW (ref 36.0–46.0)
Hemoglobin: 10.6 g/dL — ABNORMAL LOW (ref 12.0–15.0)
Hemoglobin: 9.6 g/dL — ABNORMAL LOW (ref 12.0–15.0)
MCH: 23.4 pg — ABNORMAL LOW (ref 26.0–34.0)
MCH: 23.2 pg — ABNORMAL LOW (ref 26.0–34.0)
MCHC: 29.1 g/dL — ABNORMAL LOW (ref 30.0–36.0)
MCHC: 29.3 g/dL — ABNORMAL LOW (ref 30.0–36.0)
MCV: 80.4 fL (ref 80.0–100.0)
MCV: 79.4 fL — ABNORMAL LOW (ref 80.0–100.0)
Platelets: 200 10*3/uL (ref 150–400)
Platelets: 223 10*3/uL (ref 150–400)
RBC: 4.53 MIL/uL (ref 3.87–5.11)
RBC: 4.13 MIL/uL (ref 3.87–5.11)
RDW: 21.1 % — ABNORMAL HIGH (ref 11.5–15.5)
RDW: 20.6 % — ABNORMAL HIGH (ref 11.5–15.5)
WBC: 7.7 10*3/uL (ref 4.0–10.5)
WBC: 7.5 10*3/uL (ref 4.0–10.5)
nRBC: 0 % (ref 0.0–0.2)
nRBC: 0 % (ref 0.0–0.2)

## 2022-08-27 LAB — RENAL FUNCTION PANEL
Albumin: 2.6 g/dL — ABNORMAL LOW (ref 3.5–5.0)
Anion gap: 13 (ref 5–15)
BUN: 32 mg/dL — ABNORMAL HIGH (ref 8–23)
CO2: 25 mmol/L (ref 22–32)
Calcium: 8.8 mg/dL — ABNORMAL LOW (ref 8.9–10.3)
Chloride: 93 mmol/L — ABNORMAL LOW (ref 98–111)
Creatinine, Ser: 3.52 mg/dL — ABNORMAL HIGH (ref 0.44–1.00)
GFR, Estimated: 13 mL/min — ABNORMAL LOW (ref >60)
Glucose, Bld: 302 mg/dL — ABNORMAL HIGH (ref 70–99)
Phosphorus: 5.5 mg/dL — ABNORMAL HIGH (ref 2.5–4.6)
Potassium: 2.9 mmol/L — ABNORMAL LOW (ref 3.5–5.1)
Sodium: 131 mmol/L — ABNORMAL LOW (ref 135–145)

## 2022-08-27 LAB — GASTROINTESTINAL PANEL BY PCR, STOOL (REPLACES STOOL CULTURE)

## 2022-08-27 LAB — C DIFFICILE QUICK SCREEN W PCR REFLEX
C Diff antigen: POSITIVE — AB
C Diff toxin: NEGATIVE

## 2022-08-27 LAB — TROPONIN I (HIGH SENSITIVITY): Troponin I (High Sensitivity): 22 ng/L — ABNORMAL HIGH (ref <18)

## 2022-08-27 LAB — LIPASE, BLOOD: Lipase: 22 U/L (ref 11–51)

## 2022-08-27 LAB — GLUCOSE, CAPILLARY
Glucose-Capillary: 258 mg/dL — ABNORMAL HIGH (ref 70–99)
Glucose-Capillary: 251 mg/dL — ABNORMAL HIGH (ref 70–99)
Glucose-Capillary: 192 mg/dL — ABNORMAL HIGH (ref 70–99)

## 2022-08-27 LAB — CLOSTRIDIUM DIFFICILE BY PCR, REFLEXED: Toxigenic C. Difficile by PCR: NEGATIVE

## 2022-08-27 LAB — LACTIC ACID, PLASMA: Lactic Acid, Venous: 1.5 mmol/L (ref 0.5–1.9)

## 2022-08-27 LAB — CREATININE, SERUM
Creatinine, Ser: 3.64 mg/dL — ABNORMAL HIGH (ref 0.44–1.00)
GFR, Estimated: 13 mL/min — ABNORMAL LOW (ref >60)

## 2022-08-27 LAB — HEPATITIS B SURFACE ANTIGEN: Hepatitis B Surface Ag: NONREACTIVE

## 2022-08-27 MED ORDER — SODIUM CHLORIDE 0.9 % IV SOLN
2.0000 g | INTRAVENOUS | Status: AC
  Administered 2022-08-27 – 2022-09-02 (×7): 2 g via INTRAVENOUS
  Filled 2022-08-27 (×7): qty 20

## 2022-08-27 MED ORDER — CHLORHEXIDINE GLUCONATE CLOTH 2 % EX PADS
6.0000 | MEDICATED_PAD | Freq: Every day | CUTANEOUS | Status: DC
  Administered 2022-08-27 – 2022-09-05 (×10): 6 via TOPICAL

## 2022-08-27 MED ORDER — ONDANSETRON HCL 4 MG/2ML IJ SOLN
4.0000 mg | Freq: Four times a day (QID) | INTRAMUSCULAR | Status: DC | PRN
  Administered 2022-08-27 – 2022-08-28 (×3): 4 mg via INTRAVENOUS
  Filled 2022-08-27 (×3): qty 2

## 2022-08-27 MED ORDER — ACETAMINOPHEN 325 MG PO TABS
650.0000 mg | ORAL_TABLET | Freq: Four times a day (QID) | ORAL | Status: DC | PRN
  Administered 2022-08-27 – 2022-09-03 (×3): 650 mg via ORAL
  Filled 2022-08-27 (×3): qty 2

## 2022-08-27 MED ORDER — VANCOMYCIN 50 MG/ML ORAL SOLUTION
125.0000 mg | Freq: Every day | ORAL | Status: DC
  Filled 2022-08-27: qty 2.5

## 2022-08-27 MED ORDER — HEPARIN SODIUM (PORCINE) 5000 UNIT/ML IJ SOLN
5000.0000 [IU] | Freq: Three times a day (TID) | INTRAMUSCULAR | Status: DC
  Administered 2022-08-27: 5000 [IU] via SUBCUTANEOUS
  Filled 2022-08-27: qty 1

## 2022-08-27 MED ORDER — METRONIDAZOLE 500 MG/100ML IV SOLN
500.0000 mg | Freq: Three times a day (TID) | INTRAVENOUS | Status: AC
  Administered 2022-08-28 – 2022-09-02 (×17): 500 mg via INTRAVENOUS
  Filled 2022-08-27 (×17): qty 100

## 2022-08-27 MED ORDER — VANCOMYCIN HCL 125 MG PO CAPS
125.0000 mg | ORAL_CAPSULE | Freq: Every day | ORAL | Status: DC
  Filled 2022-08-27: qty 1

## 2022-08-27 MED ORDER — HYDROMORPHONE HCL 1 MG/ML IJ SOLN
1.0000 mg | Freq: Once | INTRAMUSCULAR | Status: AC
  Administered 2022-08-27: 1 mg via INTRAVENOUS
  Filled 2022-08-27: qty 1

## 2022-08-27 MED ORDER — HYDROMORPHONE HCL 1 MG/ML IJ SOLN
0.5000 mg | INTRAMUSCULAR | Status: DC | PRN
  Administered 2022-08-27 – 2022-09-03 (×25): 0.5 mg via INTRAVENOUS
  Filled 2022-08-27 (×4): qty 1
  Filled 2022-08-27 (×2): qty 0.5
  Filled 2022-08-27 (×3): qty 1
  Filled 2022-08-27: qty 0.5
  Filled 2022-08-27 (×3): qty 1
  Filled 2022-08-27 (×2): qty 0.5
  Filled 2022-08-27 (×2): qty 1
  Filled 2022-08-27 (×3): qty 0.5
  Filled 2022-08-27 (×6): qty 1

## 2022-08-27 MED ORDER — ACETAMINOPHEN 650 MG RE SUPP: 650.0000 mg | Freq: Four times a day (QID) | RECTAL | Status: DC | PRN

## 2022-08-27 MED ORDER — INSULIN ASPART 100 UNIT/ML IJ SOLN
0.0000 [IU] | INTRAMUSCULAR | Status: DC
  Administered 2022-08-27: 3 [IU] via SUBCUTANEOUS
  Administered 2022-08-27 – 2022-08-28 (×3): 1 [IU] via SUBCUTANEOUS
  Administered 2022-08-28 (×4): 2 [IU] via SUBCUTANEOUS
  Administered 2022-08-29 – 2022-08-31 (×4): 1 [IU] via SUBCUTANEOUS

## 2022-08-27 MED ORDER — ONDANSETRON HCL 4 MG PO TABS
4.0000 mg | ORAL_TABLET | Freq: Four times a day (QID) | ORAL | Status: DC | PRN
  Administered 2022-08-28: 4 mg via ORAL
  Filled 2022-08-27: qty 1

## 2022-08-27 MED ORDER — IOHEXOL 350 MG/ML SOLN
100.0000 mL | Freq: Once | INTRAVENOUS | Status: AC | PRN
  Administered 2022-08-27: 100 mL via INTRAVENOUS

## 2022-08-27 MED ORDER — MIDODRINE HCL 5 MG PO TABS
5.0000 mg | ORAL_TABLET | ORAL | Status: DC
  Administered 2022-08-27 – 2022-08-29 (×4): 5 mg via ORAL
  Filled 2022-08-27 (×4): qty 1

## 2022-08-27 MED ORDER — POTASSIUM CHLORIDE CRYS ER 20 MEQ PO TBCR
20.0000 meq | EXTENDED_RELEASE_TABLET | Freq: Once | ORAL | Status: AC
  Administered 2022-08-27: 20 meq via ORAL
  Filled 2022-08-27: qty 1

## 2022-08-27 MED ORDER — METOCLOPRAMIDE HCL 5 MG/ML IJ SOLN
10.0000 mg | Freq: Once | INTRAMUSCULAR | Status: AC
  Administered 2022-08-27: 10 mg via INTRAVENOUS
  Filled 2022-08-27: qty 2

## 2022-08-27 MED ORDER — SODIUM CHLORIDE 0.9 % IV BOLUS
250.0000 mL | Freq: Once | INTRAVENOUS | Status: AC
  Administered 2022-08-27: 250 mL via INTRAVENOUS

## 2022-08-27 MED ORDER — SODIUM CHLORIDE 0.9% FLUSH
3.0000 mL | Freq: Two times a day (BID) | INTRAVENOUS | Status: DC
  Administered 2022-08-27 – 2022-09-05 (×18): 3 mL via INTRAVENOUS

## 2022-08-27 MED ORDER — METRONIDAZOLE 500 MG/100ML IV SOLN
500.0000 mg | Freq: Two times a day (BID) | INTRAVENOUS | Status: DC
  Administered 2022-08-27 (×2): 500 mg via INTRAVENOUS
  Filled 2022-08-27 (×2): qty 100

## 2022-08-27 MED ORDER — INSULIN GLARGINE-YFGN 100 UNIT/ML ~~LOC~~ SOLN
10.0000 [IU] | Freq: Every day | SUBCUTANEOUS | Status: DC
  Administered 2022-08-27 – 2022-08-28 (×2): 10 [IU] via SUBCUTANEOUS
  Filled 2022-08-27 (×2): qty 0.1

## 2022-08-27 NOTE — Progress Notes (Signed)
Received patient in bed to unit.  Alert and oriented.  Informed consent signed and in chart.   TX duration: 3.5hrs  Patient tolerated well.  Alert, without acute distress.  Hand-off given to patient's nurse.   Access used: R IJ Access issues: None   Total UF removed: 3476ml Medication(s) given: None  Post HD weight: 90.6kg   08/27/22 1835  Vitals  Temp 97.7 F (36.5 C)  Temp Source Oral  BP (!) 146/74  MAP (mmHg) 93  BP Location Right Arm  BP Method Automatic  Patient Position (if appropriate) Lying  Pulse Rate (!) 108  Pulse Rate Source Monitor  ECG Heart Rate (!) 108  Oxygen Therapy  SpO2 100 %  O2 Device Nasal Cannula  O2 Flow Rate (L/min) 3 L/min  Patient Activity (if Appropriate) In bed  Pulse Oximetry Type Continuous  During Treatment Monitoring  Intra-Hemodialysis Comments Tx completed;Tolerated well  Post Treatment  Dialyzer Clearance Heavily streaked  Duration of HD Treatment -hour(s) 3.5 hour(s)  Liters Processed 82.9  Fluid Removed (mL) 3400 mL  Tolerated HD Treatment Yes  Hemodialysis Catheter Right Subclavian  Placement Date: 06/06/22   Placed prior to admission: Yes  Orientation: Right  Access Location: Subclavian  Site Condition No complications  Blue Lumen Status Dead end cap in place;Heparin locked  Red Lumen Status Dead end cap in place;Heparin locked  Purple Lumen Status N/A  Catheter fill solution Heparin 1000 units/ml  Catheter fill volume (Arterial) 1.9 cc  Catheter fill volume (Venous) 1.9  Dressing Type Transparent  Dressing Status Antimicrobial disc in place;Clean, Dry, Intact  Interventions New dressing  Drainage Description None  Dressing Change Due 09/03/22  Post treatment catheter status Capped and Clamped     Jadie Comas [facility]

## 2022-08-27 NOTE — ED Provider Notes (Signed)
[facility] Provider Note   CSN: [id number] Arrival date & time: 08/26/22  2231     History  Chief Complaint  Patient presents with   Abdominal Pain    Susan [name] is a 72 y.o. female.  Patient presents to the emergency department with complaints of severe abdominal pain.  Patient comes to the ER from a nursing home.  She is complaining of pain across the upper abdomen with pain that shoots down into the lower abdomen associated with nausea and vomiting.  No associated chest pain.       Home Medications Prior to Admission medications   Medication Sig Start Date End Date Taking? Authorizing Provider  albuterol (VENTOLIN HFA) 108 (90 Base) MCG/ACT inhaler Inhale 1-2 puffs into the lungs every 6 (six) hours as needed for wheezing or shortness of breath.    [provider]  Amino Acids-Protein Hydrolys (FEEDING SUPPLEMENT, PRO-STAT SUGAR FREE 64,) LIQD Take 30 mLs by mouth in the morning and at bedtime. (1000 & 1400)    [provider]  amiodarone (PACERONE) 200 MG tablet Take 1 tablet (200 mg total) by mouth daily. 01/29/22   Rafael Bihari, FNP  aspirin EC 81 MG tablet Take 1 tablet (81 mg total) by mouth daily with breakfast. 06/04/22   Darliss Cheney, MD  blood glucose meter kit and supplies Use up to four times daily as directed. 01/23/22   Ghimire, Henreitta Leber, MD  Continuous Blood Gluc Sensor (FREESTYLE LIBRE 2 SENSOR) MISC  01/14/22   [provider]  diphenoxylate-atropine (LOMOTIL) 2.5-0.025 MG tablet Take 1 tablet by mouth 4 (four) times daily as needed for diarrhea or loose stools. 08/19/21   [name], MD  ferrous sulfate 325 (65 FE) MG tablet Take 325 mg by mouth 2 (two) times daily. (1000 & 2100)    [provider]  Fingerstix Lancets MISC Use as directed to check blood sugars as need up to 4 times daily 01/23/22   Ghimire, Henreitta Leber, MD  fluconazole (DIFLUCAN) 150 MG tablet Take 150 mg by  mouth every 3 (three) days.    [provider]  folic acid-vitamin b complex-vitamin c-selenium-zinc (DIALYVITE) 3 MG TABS tablet Take 1 tablet by mouth daily. (1000)    [provider]  hydrALAZINE (APRESOLINE) 25 MG tablet Take 1 tablet (25 mg total) by mouth every 6 (six) hours as needed (SBP>150 or DBP>100). 04/06/22   Nita Sells, MD  hydrocortisone (ANUSOL-HC) 2.5 % rectal cream Place 1 Application rectally 2 (two) times daily. (1000 & 2100)    [provider]  insulin aspart (NOVOLOG FLEXPEN) 100 UNIT/ML FlexPen Inject 0-15 Units into the skin See admin instructions. Inject 12 units subcutaneously 3 times daily with meals (0900, 1300 & 1700) & as needed via sliding scale insulin CBG <70=Notify NP/MD 71-149=0 units,150-199=4 units, 200-249=6 units,250- 299=10 units, 300-349=12 units, 350-399=15 units, > 400 call MD ( prime pen with 2 units prior  to set dose)    [provider]  insulin glargine (LANTUS SOLOSTAR) 100 UNIT/ML Solostar Pen Inject 40 Units into the skin at bedtime. Patient taking differently: Inject 20 Units into the skin at bedtime. 06/17/22   [place] Donnamarie Poag, DO  Insulin Pen Needle 32G X 4 MM MISC Use as directed 01/23/22   Jonetta Osgood, MD  levothyroxine (SYNTHROID) 75 MCG tablet Take 75 mcg by mouth daily before breakfast.    [provider]  linagliptin (TRADJENTA) 5 MG  TABS tablet Take 1 tablet (5 mg total) by mouth daily. Patient not taking: Reported on 08/18/2022 04/07/22   Nita Sells, MD  LORazepam (ATIVAN) 0.5 MG tablet Take 1 tablet (0.5 mg total) by mouth every 8 (eight) hours as needed for anxiety. Patient not taking: Reported on 08/18/2022 06/17/22   [place] Donnamarie Poag, DO  magnesium oxide (MAG-OX) 400 (240 Mg) MG tablet Take 1 tablet (400 mg total) by mouth daily. 07/18/21   [name], MD  midodrine (PROAMATINE) 10 MG tablet Take 10 mg by mouth every Monday, Wednesday, and Friday with hemodialysis.     [provider]  midodrine (PROAMATINE) 5 MG tablet Take 5 mg by mouth See admin instructions. Take 1 tablet (5 mg) by mouth with meals on Tuesdays, Thursdays, Saturdays & Sundays.    [provider]  Nutritional Supplements (FEEDING SUPPLEMENT, NEPRO CARB STEADY,) LIQD Take 237 mLs by mouth daily. (0900)    [provider]  nystatin powder Apply 1 Application topically 2 (two) times daily. Applied to vagina and groin area    [provider]  ondansetron (ZOFRAN) 4 MG tablet Take 4 mg by mouth every 8 (eight) hours as needed for nausea.    [provider]  oxyCODONE-acetaminophen (PERCOCET/ROXICET) 5-325 MG tablet Take 1 tablet by mouth every 6 (six) hours as needed. 08/20/22   Baglia, Corrina, PA-C  OXYGEN Inhale 2 L/min into the lungs at bedtime.    [provider]  pantoprazole (PROTONIX) 40 MG tablet Take 1 tablet (40 mg total) by mouth daily. 05/28/22 05/28/23  Darliss Cheney, MD  polyethylene glycol (MIRALAX / GLYCOLAX) 17 g packet Take 17 g by mouth 2 (two) times daily. Patient taking differently: Take 17 g by mouth daily as needed (constipation.). 06/17/22   [place] Donnamarie Poag, DO  pregabalin (LYRICA) 75 MG capsule Take 1 capsule (75 mg total) by mouth daily. 04/07/22   Nita Sells, MD  senna-docusate (SENOKOT-S) 8.6-50 MG tablet Take 2 tablets by mouth 2 (two) times daily. 06/17/22   [place] Donnamarie Poag, DO  sevelamer carbonate (RENVELA) 800 MG tablet Take 1,600 mg by mouth 3 (three) times daily with meals. (0800, 1200 & 1700)    [provider]  vortioxetine HBr (TRINTELLIX) 20 MG TABS tablet Take 1 tablet (20 mg total) by mouth in the morning. (1000) 08/11/22   [name], MD      Allergies    Cephalexin and Codeine    Review of Systems   Review of Systems  Physical Exam Updated Vital Signs BP (!) 145/82 (BP Location: Right Arm)   Pulse 100   Temp 98.1 F (36.7 C) (Oral)   Resp 18   SpO2 100%  Physical  Exam Vitals and nursing note reviewed.  Constitutional:      General: She is not in acute distress.    Appearance: She is well-developed. She is ill-appearing.  HENT:     Head: Normocephalic and atraumatic.     Mouth/Throat:     Mouth: Mucous membranes are moist.  Eyes:     General: Vision grossly intact. Gaze aligned appropriately.     Extraocular Movements: Extraocular movements intact.     Conjunctiva/sclera: Conjunctivae normal.  Cardiovascular:     Rate and Rhythm: Regular rhythm. Tachycardia present.     Pulses: Normal pulses.     Heart sounds: Normal heart sounds, S1 normal and S2 normal. No murmur heard.    No friction rub. No gallop.  Pulmonary:  Effort: Pulmonary effort is normal. No respiratory distress.     Breath sounds: Normal breath sounds.  Abdominal:     General: Bowel sounds are normal.     Palpations: Abdomen is soft.     Tenderness: There is generalized abdominal tenderness. There is no guarding or rebound.     Hernia: No hernia is present.  Musculoskeletal:        General: No swelling.     Cervical back: Full passive range of motion without pain, normal range of motion and neck supple. No spinous process tenderness or muscular tenderness. Normal range of motion.     Right lower leg: No edema.     Left lower leg: No edema.  Skin:    General: Skin is warm and dry.     Capillary Refill: Capillary refill takes less than 2 seconds.     Findings: No ecchymosis, erythema, rash or wound.  Neurological:     General: No focal deficit present.     Mental Status: She is alert and oriented to person, place, and time.     GCS: GCS eye subscore is 4. GCS verbal subscore is 5. GCS motor subscore is 6.     Cranial Nerves: Cranial nerves 2-12 are intact.     Sensory: Sensation is intact.     Motor: Motor function is intact.     Coordination: Coordination is intact.  Psychiatric:        Attention and Perception: Attention normal.        Mood and Affect: Mood normal.         Speech: Speech normal.        Behavior: Behavior normal.     ED Results / Procedures / Treatments   Labs (all labs ordered are listed, but only abnormal results are displayed) Labs Reviewed  CBC WITH DIFFERENTIAL/PLATELET - Abnormal; Notable for the following components:      Result Value   Hemoglobin 10.3 (*)    HCT 35.3 (*)    MCV 79.1 (*)    MCH 23.1 (*)    MCHC 29.2 (*)    RDW 20.7 (*)    Platelets 100 (*)    Neutro Abs 8.1 (*)    Lymphs Abs 0.5 (*)    All other components within normal limits  COMPREHENSIVE METABOLIC PANEL - Abnormal; Notable for the following components:   Potassium 2.8 (*)    Chloride 95 (*)    Glucose, Bld 208 (*)    BUN 30 (*)    Creatinine, Ser 3.44 (*)    Total Protein 6.4 (*)    Albumin 2.8 (*)    Alkaline Phosphatase 155 (*)    GFR, Estimated 14 (*)    Anion gap 18 (*)    All other components within normal limits  TROPONIN I (HIGH SENSITIVITY) - Abnormal; Notable for the following components:   Troponin I (High Sensitivity) 21 (*)    All other components within normal limits  TROPONIN I (HIGH SENSITIVITY) - Abnormal; Notable for the following components:   Troponin I (High Sensitivity) 22 (*)    All other components within normal limits  LACTIC ACID, PLASMA  LIPASE, BLOOD    EKG EKG Interpretation  Date/Time:  Tuesday August 26 2022 22:39:14 EDT Ventricular Rate:  105 PR Interval:  151 QRS Duration: 145 QT Interval:  429 QTC Calculation: 568 R Axis:   -65 Text Interpretation: Sinus tachycardia IVCD, consider atypical RBBB Inferior infarct, recent Anterolateral infarct, age indeterminate Baseline wander  in lead(s) III Confirmed by Orpah Greek [phone]) on 08/27/2022 6:35:39 AM  Radiology CT ANGIO CHEST/ABD/PEL FOR DISSECTION W &/OR WO CONTRAST  Result Date: 08/27/2022 CLINICAL DATA:  Suspected acute aortic syndrome. EXAM: CT ANGIOGRAPHY CHEST, ABDOMEN AND PELVIS TECHNIQUE: Non-contrast CT of the chest was initially  obtained. Multidetector CT imaging through the chest, abdomen and pelvis was performed using the standard protocol during bolus administration of intravenous contrast. Multiplanar reconstructed images and MIPs were obtained and reviewed to evaluate the vascular anatomy. RADIATION DOSE REDUCTION: This exam was performed according to the departmental dose-optimization program which includes automated exposure control, adjustment of the mA and/or kV according to patient size and/or use of iterative reconstruction technique. CONTRAST:  158mL OMNIPAQUE IOHEXOL 350 MG/ML SOLN COMPARISON:  The chest CT without contrast 06/12/2022, abdomen and pelvis CT no contrast 06/05/2022, and CTA abdomen and pelvis 05/23/2022. FINDINGS: CTA CHEST FINDINGS Cardiovascular: There is mild-to-moderate panchamber cardiomegaly, interval increased. Three-vessel coronary calcifications and likely stents are present. Right IJ double-lumen catheter terminates at the superior cavoatrial junction. Minimal chronic pericardial effusion collecting to the right inferiorly. Pulmonary arteries are normal caliber without visible emboli. The pulmonary veins are distended. There is IVC and hepatic vein contrast reflux which may suggest right heart dysfunction or tricuspid regurgitation. There is moderate to heavy calcification in the aortic arch, mild scattered calcific plaque in the descending segment. There is 80% calcific origin stenosis of the left subclavian artery and at least an 80% calcific origin stenosis of the left vertebral artery. There are innominate and proximal right subclavian artery calcifications which are nonstenosing. Mediastinum/Nodes: Prominent subcarinal lymph nodes are again noted up to 1.4 cm in short axis. Enlarged precarinal lymph node again measures 1.5 cm short axis and is unchanged. A right mid hilar lymph node again measures 1.1 cm in short axis. There are calcified right hilar and subcarinal nodes. There is a 6 mm calcified  nodule in the right lobe of the thyroid gland. No follow-up imaging is recommended. There is no axillary or supraclavicular adenopathy. The thoracic trachea is unremarkable. The thoracic esophagus is normal in thickness. Lungs/Pleura: There is generalized interstitial edema. Small to moderate left and small right layering pleural effusions are present. There is compressive atelectasis or consolidation in the left lower lobe basal segments alongside the effusion. There is ground-glass haziness of the upper to mid lung fields, most likely ground-glass edema, less likely pneumonitis. There is abundant breathing motion artifact. A calcified granuloma noted in the medial right upper lobe anterior segment. No noncalcified nodule is seen through the breathing motion. There is no pneumothorax. There are scattered linear scar-like opacities in the bases. Musculoskeletal: There are degenerative changes of the thoracic spine and multilevel bridging enthesopathy. No primary pathologic process is seen. A bone island is again noted in the right humeral head. The ribcage is intact. Review of the MIP images confirms the above findings. CTA ABDOMEN AND PELVIS FINDINGS VASCULAR Aorta: Moderate calcific plaques. No aneurysm, dissection or stenosis. Celiac: Moderate calcific plaques are noted without flow limiting stenosis or branch occlusion. Circumferential calcification in the branch arteries is again noted including extensive small branch arterial calcifications in the spleen and liver. SMA: There are moderate calcifications without flow-limiting stenosis. Calcifications continue into the branch arteries without evidence of branch occlusion. Renals: Ostial calcifications associated with 40-50% arterial origin stenosis on the left and 40% arterial origin stenosis on the right. Calcifications continuing to the hila and parenchymal branch arteries. IMA: There is circumferential calcification and severe  origin stenosis. Flow is  otherwise preserved. Inflow: Moderate to heavy calcification both common iliac and internal iliac arteries. Vessel stenosis does not exceed 50% in either vessel, with calcifications continuing into the internal iliac branch arteries. There is only mild calcification in the external iliac arteries, without stenosis. Veins: Unopacified except for hepatic veins, which are patent Review of the MIP images confirms the above findings. NON-VASCULAR Hepatobiliary: Respiratory motion limits evaluation. Scattered calcified granulomas are again noted without appreciable mass. There are gallstones with no obvious wall thickening no biliary dilatation is seen through the breathing motion. Pancreas: No abnormality is seen through the breathing motion. Spleen: Interval new capsular contraction is seen over a hypodense abnormality in the superomedial aspect of the spleen, which was previously larger. Findings most likely due to an evolving infarct or a "burned out" infectious process. There are scattered calcified granulomas. No other focal abnormality. Adrenals/Urinary Tract: Extensive renovascular calcifications. Chronic nodular thickening both adrenal glands. Mild bilateral renal volume loss is again shown but there is homogeneous cortical enhancement, with scattered scar-like defects. No hydronephrosis or stone disease is seen. No mass enhancement. There is mild bladder thickening and perivesical haziness concerning for cystitis. Stomach/Bowel: The stomach is contracted. The small bowel is normal caliber. Mesenteric congestive changes noted in lower abdomen and pelvis. Appendix is normal. Most of the large bowel is normal thickness. There are diffuse diverticula. There is moderate diffuse thickening of the wall of the most distal sigmoid and continuing through the rectum with perirectal stranding. Findings consistent with proctocolitis which was seen on the prior studies as well. Consider follow-up colonoscopy. No bowel  pneumatosis is seen. Lymphatic: There is no lymphadenopathy. Reproductive: Status post hysterectomy. No adnexal masses. Other: Small volume of pelvic ascites. Minimal ascites in the pericolic gutters. No free air. No abscess or pneumatosis. Musculoskeletal: Osteopenia and degenerative change of the spine. No aggressive focal pathologic process. Mild generalized body wall anasarca has increased since the last CT. Review of the MIP images confirms the above findings. IMPRESSION: 1. Aortic and branch vessel atherosclerosis without aneurysm or dissection. 2. 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 3. Cardiomegaly with pulmonary venous distention, generalized interstitial edema and left greater than right pleural effusions. Findings consistent with CHF or fluid overload. 4. Ground-glass haziness in the upper to mid lung fields, most likely ground-glass edema, less likely pneumonitis. 5. Compressive atelectasis or consolidation in the left lower lobe basal segments alongside the effusion. 6. Stable mediastinal and right hilar adenopathy. 7. Interval new capsular contraction over a hypodense abnormality in the superomedial aspect of the spleen, previously larger. Findings most likely due to an evolving infarct or a burned out infectious process. 8. Likely cystitis. 9. Cholelithiasis. 10. 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 No bowel pneumatosis. Consider follow-up colonoscopy if not recently performed. This has been seen previously. The etiology could be infectious, inflammatory or ischemic but there is no pneumatosis. 11. Diverticulosis without evidence of diverticulitis. 12. Body wall anasarca increased since the last CT. Aortic Atherosclerosis (ICD10-I70.0). Electronically Signed   By: Telford Nab M.D.   On: 08/27/2022 06:08    Procedures Procedures    Medications Ordered in ED Medications   HYDROmorphone (DILAUDID) injection 1 mg (has no administration in time range)  metoCLOPramide (REGLAN) injection 10 mg (10 mg Intravenous Given 08/27/22 0032)  HYDROmorphone (DILAUDID) injection 1 mg (1 mg Intravenous Given 08/27/22 0032)  iohexol (OMNIPAQUE) 350 MG/ML injection  100 mL (100 mLs Intravenous Contrast Given 08/27/22 [id number])    ED Course/ Medical Decision Making/ A&P                             Medical Decision Making Amount and/or Complexity of Data Reviewed External Data Reviewed: labs, ECG and notes. Labs: ordered. Decision-making details documented in ED Course. Radiology: ordered and independent interpretation performed. Decision-making details documented in ED Course. ECG/medicine tests: ordered and independent interpretation performed. Decision-making details documented in ED Course.  Risk Prescription drug management.   Differential Diagnosis considered includes, but not limited to: Cholelithiasis; cholecystitis; cholangitis; bowel obstruction; esophagitis; gastritis; peptic ulcer disease; pancreatitis; cardiac.   Patient actively vomiting and complaining of moderate to severe pain at arrival.  Abdominal exam did reveal diffuse tenderness but no focal tenderness, no guarding, no rebound.  She was afebrile at arrival.  Patient had lab work drawn.  She has mild hypokalemia, otherwise labs are unremarkable.  Patient underwent CT angiography of chest, abdomen and pelvis.  No aortic syndrome.  No PE.  Patient does have some atherosclerosis noted but no obvious ischemia.  There are other findings on the CT scan, however.  She has findings that are consistent with splenic infarct.  This would explain her severe pain.  Additionally she has evidence of volume overload.  She is a dialysis patient, normally dialyzed on Tuesday, Thursday, Saturday.  Will likely require additional dialysis today.  Patient is EKG without obvious ischemia.  Troponin very slightly elevated but this is  chronic for her and plateaued.  I do not suspect acute coronary syndrome.  Patient requiring multiple doses of analgesia.  She has multiple problems and will require hospitalization for further management.        Final Clinical Impression(s) / ED Diagnoses Final diagnoses:  Splenic infarct  Acute on chronic combined systolic and diastolic congestive heart failure    Rx / DC Orders ED Discharge Orders     None         Hussein Macdougal, Gwenyth Allegra, MD 08/27/22 [id number]

## 2022-08-27 NOTE — Inpatient Diabetes Management (Signed)
Inpatient Diabetes Program Recommendations  AACE/ADA: New Consensus Statement on Inpatient Glycemic Control (2015)  Target Ranges:  Prepandial:   less than 140 mg/dL      Peak postprandial:   less than 180 mg/dL (1-2 hours)      Critically ill patients:  140 - 180 mg/dL   Lab Results  Component Value Date   GLUCAP 251 (H) 08/27/2022   HGBA1C 8.9 (H) 05/02/2022    Latest Reference Range & Units 08/27/22 08:23 08/27/22 11:16  Glucose-Capillary 70 - 99 mg/dL 258 (H) 251 (H)  (H): Data is abnormally high  Latest Reference Range & Units 08/26/22 22:47  Sodium 135 - 145 mmol/L 135  Potassium 3.5 - 5.1 mmol/L 2.8 (L)  Chloride 98 - 111 mmol/L 95 (L)  CO2 22 - 32 mmol/L 22  Glucose 70 - 99 mg/dL 208 (H)  BUN 8 - 23 mg/dL 30 (H)  Creatinine 0.44 - 1.00 mg/dL 3.44 (H)  Calcium 8.9 - 10.3 mg/dL 9.1  Anion gap 5 - 15  18 (H)  (L): Data is abnormally low (H): Data is abnormally high  Diabetes history: DM Outpatient Diabetes medications: Lantus 20 units, Novolog 12 units tid meal coverage + correction, Tradjenta 5 mg qd Current orders for Inpatient glycemic control: Novolog 0-6 units q 4 hrs.  Inpatient Diabetes Program Recommendations:   -Start Semglee 20 units now and qd -Add Novolog meal coverage 4-5 units tid when eating improves to 50% meals  Thank you, Nani Gasser. Karelyn Brisby, RN, MSN, CDE  Diabetes Coordinator Inpatient Glycemic Control Team Team Pager [phone] (8am-5pm) 08/27/2022 1:38 PM

## 2022-08-27 NOTE — Procedures (Signed)
   I was present at this dialysis session, have reviewed the session itself and made  appropriate changes Kelly Splinter MD [facility] pager [phone]   08/27/2022, 4:50 PM

## 2022-08-27 NOTE — ED Notes (Signed)
ED TO INPATIENT HANDOFF REPORT  ED Nurse Name and Phone #: Chetara Kropp [phone]  S Name/Age/Gender Susan [name] 72 y.o. female Room/Bed: 022C/022C  Code Status   Code Status: Prior  Home/SNF/Other Rehab Patient oriented to: self, place, time, and situation Is this baseline? Yes   Triage Complete: Triage complete  Chief Complaint Abdominal pain [R10.9]  Triage Note Pt BIBA from SNF with epigastric pain, N/V. 4mg  of zofran given prior to arrival.    Allergies Allergies  Allergen Reactions   Cephalexin Diarrhea and Other (See Comments)   Codeine Nausea And Vomiting and Other (See Comments)    Level of Care/Admitting Diagnosis ED Disposition     ED Disposition  Admit   Condition  --   [facility]: [place] [id number]  Level of Care: Med-Surg [16]  May admit patient to [name] or [facility] if equivalent level of care is available:: No  Covid Evaluation: Confirmed COVID Negative  Diagnosis: Abdominal pain [id number]  Admitting Physician: Elodia Florence [id number]  Attending Physician: Cephus Slater, Lamonte Sakai [id number]  Certification:: I certify this patient will need inpatient services for at least 2 midnights  Estimated Length of Stay: 4          B Medical/Surgery History Past Medical History:  Diagnosis Date   Acute MI ([place]) 1999; 2007; 03/05/21   Anemia    hx   Anginal pain ([place])    Anxiety    ARF (acute renal failure) ([place]) 06/2017   [facility]   Arthritis    "generalized" (03/15/2014)   CAD (coronary artery disease)    MI in 2000 - MI  2007 - treated bare metal stent (no nuclear since then as 9/11)   Carotid artery disease (HCC)    CHF (congestive heart failure) ([place])    HFrEF 03/06/21   Chronic diastolic heart failure ([place])    a) ECHO (08/2013) EF 55-60% and RV function nl b) RHC (08/2013) RA 4, RV 30/5/7, PA 25/10 (16), PCWP 7, Fick CO/CI 6.3/2.7, PVR 1.5 WU, PA 61 and 66%   Daily headache    "~ every  other day; since I fell in June" (03/15/2014)   Depression    Dyslipidemia    Dyspnea    uses oxygen qhs via [place]   ESRD (end stage renal disease) ([place])    Dialysis on Tues Thurs Sat   Exertional shortness of breath    History of kidney stones    HTN (hypertension)    Hypothyroidism    Neuropathy    Obesity    Osteoarthritis    PAF (paroxysmal atrial fibrillation) (HCC)    Peripheral neuropathy    bilateral feet/hands   PONV (postoperative nausea and vomiting)    RBBB (right bundle branch block)    Old   Stroke ([place])    mini strokes   Syncope    likely due to low blood sugar   Tachycardia    Sinus tachycardia   Type II diabetes mellitus (HCC)    Type II, Lamar Sprinkles libre left upper arm. patient has omnipod insulin pump with Novolin R Insulin   Urinary incontinence    Venous insufficiency    Past Surgical History:  Procedure Laterality Date   ABDOMINAL HYSTERECTOMY  1980's   AMPUTATION Right 02/24/2018   Procedure: RIGHT FOOT GREAT TOE AND 2ND TOE AMPUTATION;  Surgeon: Newt Minion, MD;  Location: [place];  Service: Orthopedics;  Laterality: Right;   AMPUTATION Right 04/30/2018  Procedure: RIGHT TRANSMETATARSAL AMPUTATION;  Surgeon: Newt Minion, MD;  Location: [place];  Service: Orthopedics;  Laterality: Right;   AMPUTATION Right 05/02/2022   Procedure: RIGHT BELOW KNEE AMPUTATION;  Surgeon: Newt Minion, MD;  Location: [place];  Service: Orthopedics;  Laterality: Right;   APPLICATION OF WOUND VAC Right 06/13/2022   Procedure: APPLICATION OF WOUND VAC;  Surgeon: Newt Minion, MD;  Location: [place];  Service: Orthopedics;  Laterality: Right;   AV FISTULA PLACEMENT Left 04/02/2022   Procedure: LEFT ARM ARTERIOVENOUS (AV) FISTULA CREATION;  Surgeon: Serafina Mitchell, MD;  Location: [place];  Service: Vascular;  Laterality: Left;  PERIPHERAL NERVE BLOCK   BASCILIC VEIN TRANSPOSITION Left 07/31/2022   Procedure: LEFT ARM SECOND STAGE BASILIC VEIN TRANSPOSITION;  Surgeon: Serafina Mitchell, MD;  Location: [place];  Service: Vascular;  Laterality: Left;   BIOPSY  05/27/2020   Procedure: BIOPSY;  Surgeon: [name], DO;  [facility];  Service: Endoscopy;;   CATARACT EXTRACTION, BILATERAL Bilateral ?2013   COLONOSCOPY W/ POLYPECTOMY     COLONOSCOPY WITH PROPOFOL N/A 03/13/2019   Procedure: COLONOSCOPY WITH PROPOFOL;  Surgeon: Jerene Bears, MD;  Location: [place];  Service: Gastroenterology;  Laterality: N/A;   CORONARY ANGIOPLASTY WITH STENT PLACEMENT  1999; 2007   "1 + 1"   ERCP N/A 02/03/2022   Procedure: ENDOSCOPIC RETROGRADE CHOLANGIOPANCREATOGRAPHY (ERCP);  Surgeon: Carol Ada, MD;  Location: [place];  Service: Gastroenterology;  Laterality: N/A;   ESOPHAGOGASTRODUODENOSCOPY (EGD) WITH PROPOFOL N/A 03/13/2019   Procedure: ESOPHAGOGASTRODUODENOSCOPY (EGD) WITH PROPOFOL;  Surgeon: Jerene Bears, MD;  Location: [facility] ENDOSCOPY;  Service: Gastroenterology;  Laterality: N/A;   ESOPHAGOGASTRODUODENOSCOPY (EGD) WITH PROPOFOL N/A 05/27/2020   Procedure: ESOPHAGOGASTRODUODENOSCOPY (EGD) WITH PROPOFOL;  Surgeon: [name], DO;  [facility];  Service: Endoscopy;  Laterality: N/A;   EYE SURGERY Bilateral    lazer   FLEXIBLE SIGMOIDOSCOPY N/A 05/23/2022   Procedure: FLEXIBLE SIGMOIDOSCOPY;  Surgeon: Carol Ada, MD;  Location: [place];  Service: Gastroenterology;  Laterality: N/A;   FLEXIBLE SIGMOIDOSCOPY N/A 05/24/2022   Procedure: FLEXIBLE SIGMOIDOSCOPY;  Surgeon: Sharyn Creamer, MD;  Location: [place];  Service: Gastroenterology;  Laterality: N/A;   HEMOSTASIS CLIP PLACEMENT  03/13/2019   Procedure: HEMOSTASIS CLIP PLACEMENT;  Surgeon: Jerene Bears, MD;  Location: [facility];  Service: Gastroenterology;;   HEMOSTASIS CLIP PLACEMENT  05/23/2022   Procedure: HEMOSTASIS CLIP PLACEMENT;  Surgeon: Carol Ada, MD;  Location: [place];  Service: Gastroenterology;;   HEMOSTASIS CONTROL  05/24/2022   Procedure: HEMOSTASIS  CONTROL;  Surgeon: Sharyn Creamer, MD;  Location: [place];  Service: Gastroenterology;;   HOT HEMOSTASIS N/A 05/23/2022   Procedure: HOT HEMOSTASIS (ARGON PLASMA COAGULATION/BICAP);  Surgeon: Carol Ada, MD;  Location: [place];  Service: Gastroenterology;  Laterality: N/A;   I & D EXTREMITY Left 05/05/2022   Procedure: IRRIGATION AND DEBRIDEMENT LEFT ARM AV FISTULA;  Surgeon: Marty Heck, MD;  Location: [place];  Service: Vascular;  Laterality: Left;   INSERTION OF DIALYSIS CATHETER Right 04/02/2022   Procedure: INSERTION OF TUNNELED DIALYSIS CATHETER;  Surgeon: Serafina Mitchell, MD;  Location: [place];  Service: Vascular;  Laterality: Right;   KNEE ARTHROSCOPY Left 10/25/2006   POLYPECTOMY  03/13/2019   Procedure: POLYPECTOMY;  Surgeon: Jerene Bears, MD;  Location: [facility] ENDOSCOPY;  Service: Gastroenterology;;   REMOVAL OF STONES  02/03/2022   Procedure: REMOVAL OF STONES;  Surgeon: Carol Ada, MD;  [facility];  Service: Gastroenterology;;   REVISON OF ARTERIOVENOUS FISTULA Left 08/20/2022   Procedure: REVISON OF LEFT ARM ARTERIOVENOUS FISTULA;  Surgeon: Serafina Mitchell, MD;  Location: [place];  Service: Vascular;  Laterality: Left;   RIGHT HEART CATH N/A 07/24/2017   Procedure: RIGHT HEART CATH;  Surgeon: Jolaine Artist, MD;  Location: [place] [facility];  Service: Cardiovascular;  Laterality: N/A;   RIGHT HEART CATHETERIZATION N/A 09/22/2013   Procedure: RIGHT HEART CATH;  Surgeon: Jolaine Artist, MD;  Location: [facility] CATH LAB;  Service: Cardiovascular;  Laterality: N/A;   SHOULDER ARTHROSCOPY WITH OPEN ROTATOR CUFF REPAIR Right 03/14/2014   Procedure: RIGHT SHOULDER ARTHROSCOPY WITH BICEPS RELEASE, OPEN SUBSCAPULA REPAIR, OPEN SUPRASPINATUS REPAIR.;  Surgeon: Meredith Pel, MD;  Location: [place];  Service: Orthopedics;  Laterality: Right;   SPHINCTEROTOMY  02/03/2022   Procedure: SPHINCTEROTOMY;  Surgeon: Carol Ada, MD;  Location: [place];  Service:  Gastroenterology;;   STUMP REVISION Right 06/13/2022   Procedure: REVISION RIGHT BELOW KNEE AMPUTATION;  Surgeon: Newt Minion, MD;  Location: [place];  Service: Orthopedics;  Laterality: Right;   TEE WITHOUT CARDIOVERSION N/A 02/04/2022   Procedure: TRANSESOPHAGEAL ECHOCARDIOGRAM (TEE);  Surgeon: Jolaine Artist, MD;  [facility];  Service: Cardiovascular;  Laterality: N/A;   THROMBECTOMY W/ EMBOLECTOMY Left 08/20/2022   Procedure: THROMBECTOMY OF LEFT ARM ARTERIOVENOUS FISTULA;  Surgeon: Serafina Mitchell, MD;  Location: [place];  Service: Vascular;  Laterality: Left;   TOE AMPUTATION Right 02/24/2018   GREAT TOE AND 2ND TOE AMPUTATION   TUBAL LIGATION  1970's     A IV Location/Drains/Wounds Patient Lines/Drains/Airways Status     Active Line/Drains/Airways     Name Placement date Placement time Site Days   Peripheral IV 08/26/22 22 G Anterior;Right Forearm 08/26/22  2249  Forearm  1   Peripheral IV 08/27/22 20 G 2.5" Anterior;Distal;Right;Upper Arm 08/27/22  0412  Arm  less than 1   Fistula / Graft Left Upper arm --  --  Upper arm  --   Fistula / Graft Left Upper arm Arteriovenous fistula 07/31/22  0942  Upper arm  27   Hemodialysis Catheter Right Subclavian 06/06/22  --  Subclavian  82   Negative Pressure Wound Therapy Leg Right;Circumferential;Lower 06/13/22  1548  --  75   Pressure Injury 05/23/22 Buttocks Right Deep Tissue Pressure Injury - Purple or maroon localized area of discolored intact skin or blood-filled blister due to damage of underlying soft tissue from pressure and/or shear. 05/23/22  1715  -- 96   Pressure Injury 05/23/22 Right;Left;Posterior Stage 1 -  Intact skin with non-blanchable redness of a localized area usually over a bony prominence. 05/23/22  --  -- 96   Wound / Incision (Open or Dehisced) 05/02/22 Irritant Dermatitis (Moisture Associated Skin Damage) Thigh Anterior;Distal;Left 05/02/22  1500  Thigh  117   Wound / Incision (Open or Dehisced)  05/23/22 Non-pressure wound Thigh Right;Anterior black scar 05/23/22  1740  Thigh  96            Intake/Output Last 24 hours No intake or output data in the 24 hours ending 08/27/22 0717  Labs/Imaging Results for orders placed or performed during the hospital encounter of 08/26/22 (from the past 48 hour(s))  CBC with Differential/Platelet     Status: Abnormal   Collection Time: 08/26/22 10:47 PM  Result Value Ref Range   WBC 9.2 4.0 - 10.5 K/uL   RBC 4.46 3.87 - 5.11  MIL/uL   Hemoglobin 10.3 (L) 12.0 - 15.0 g/dL   HCT 35.3 (L) 36.0 - 46.0 %   MCV 79.1 (L) 80.0 - 100.0 fL   MCH 23.1 (L) 26.0 - 34.0 pg   MCHC 29.2 (L) 30.0 - 36.0 g/dL   RDW 20.7 (H) 11.5 - 15.5 %   Platelets 100 (L) 150 - 400 K/uL    Comment: REPEATED TO VERIFY   nRBC 0.0 0.0 - 0.2 %   Neutrophils Relative % 88 %   Neutro Abs 8.1 (H) 1.7 - 7.7 K/uL   Lymphocytes Relative 5 %   Lymphs Abs 0.5 (L) 0.7 - 4.0 K/uL   Monocytes Relative 5 %   Monocytes Absolute 0.4 0.1 - 1.0 K/uL   Eosinophils Relative 1 %   Eosinophils Absolute 0.1 0.0 - 0.5 K/uL   Basophils Relative 0 %   Basophils Absolute 0.0 0.0 - 0.1 K/uL   Immature Granulocytes 1 %   Abs Immature Granulocytes 0.05 0.00 - 0.07 K/uL    Comment: Performed at [facility], [address], [place], [place] [address]  Comprehensive metabolic panel     Status: Abnormal   Collection Time: 08/26/22 10:47 PM  Result Value Ref Range   Sodium 135 135 - 145 mmol/L   Potassium 2.8 (L) 3.5 - 5.1 mmol/L   Chloride 95 (L) 98 - 111 mmol/L   CO2 22 22 - 32 mmol/L   Glucose, Bld 208 (H) 70 - 99 mg/dL    Comment: Glucose reference range applies only to samples taken after fasting for at least 8 hours.   BUN 30 (H) 8 - 23 mg/dL   Creatinine, Ser 3.44 (H) 0.44 - 1.00 mg/dL   Calcium 9.1 8.9 - 10.3 mg/dL   Total Protein 6.4 (L) 6.5 - 8.1 g/dL   Albumin 2.8 (L) 3.5 - 5.0 g/dL   AST 18 15 - 41 U/L   ALT 14 0 - 44 U/L   Alkaline Phosphatase 155 (H) 38 - 126 U/L    Total Bilirubin 0.8 0.3 - 1.2 mg/dL   GFR, Estimated 14 (L) >60 mL/min    Comment: (NOTE) Calculated using the CKD-EPI Creatinine Equation (2021)    Anion gap 18 (H) 5 - 15    Comment: Performed at [facility], [place] [address], [place], [place] [address]  Troponin I (High Sensitivity)     Status: Abnormal   Collection Time: 08/26/22 10:47 PM  Result Value Ref Range   Troponin I (High Sensitivity) 21 (H) <18 ng/L    Comment: (NOTE) Elevated high sensitivity troponin I (hsTnI) values and significant  changes across serial measurements may suggest ACS but many other  chronic and acute conditions are known to elevate hsTnI results.  Refer to the "Links" section for chest pain algorithms and additional  guidance. Performed at [facility], [place] [address] [place], [place] [address]   Lactic acid, plasma     Status: None   Collection Time: 08/27/22 12:12 AM  Result Value Ref Range   Lactic Acid, Venous 1.5 0.5 - 1.9 mmol/L    Comment: Performed at [place] [address] [place], [place] [address]  Troponin I (High Sensitivity)     Status: Abnormal   Collection Time: 08/27/22  1:15 AM  Result Value Ref Range   Troponin I (High Sensitivity) 22 (H) <18 ng/L    Comment: (NOTE) Elevated high sensitivity troponin I (hsTnI) values and significant  changes  across serial measurements may suggest ACS but many other  chronic and acute conditions are known to elevate hsTnI results.  Refer to the "Links" section for chest pain algorithms and additional  guidance. Performed at [facility], [place] [address] [place], [place] [address]    *Note: Due to a large number of results and/or encounters for the requested time period, some results have not been displayed. A complete set of results can be found in Results Review.   CT ANGIO CHEST/ABD/PEL FOR DISSECTION W &/OR WO CONTRAST  Result Date: 08/27/2022 CLINICAL DATA:  Suspected acute aortic syndrome. EXAM: CT  ANGIOGRAPHY CHEST, ABDOMEN AND PELVIS TECHNIQUE: Non-contrast CT of the chest was initially obtained. Multidetector CT imaging through the chest, abdomen and pelvis was performed using the standard protocol during bolus administration of intravenous contrast. Multiplanar reconstructed images and MIPs were obtained and reviewed to evaluate the vascular anatomy. RADIATION DOSE REDUCTION: This exam was performed according to the departmental dose-optimization program which includes automated exposure control, adjustment of the mA and/or kV according to patient size and/or use of iterative reconstruction technique. CONTRAST:  163mL OMNIPAQUE IOHEXOL 350 MG/ML SOLN COMPARISON:  The chest CT without contrast 06/12/2022, abdomen and pelvis CT no contrast 06/05/2022, and CTA abdomen and pelvis 05/23/2022. FINDINGS: CTA CHEST FINDINGS Cardiovascular: There is mild-to-moderate panchamber cardiomegaly, interval increased. Three-vessel coronary calcifications and likely stents are present. Right IJ double-lumen catheter terminates at the superior cavoatrial junction. Minimal chronic pericardial effusion collecting to the right inferiorly. Pulmonary arteries are normal caliber without visible emboli. The pulmonary veins are distended. There is IVC and hepatic vein contrast reflux which may suggest right heart dysfunction or tricuspid regurgitation. There is moderate to heavy calcification in the aortic arch, mild scattered calcific plaque in the descending segment. There is 80% calcific origin stenosis of the left subclavian artery and at least an 80% calcific origin stenosis of the left vertebral artery. There are innominate and proximal right subclavian artery calcifications which are nonstenosing. Mediastinum/Nodes: Prominent subcarinal lymph nodes are again noted up to 1.4 cm in short axis. Enlarged precarinal lymph node again measures 1.5 cm short axis and is unchanged. A right mid hilar lymph node again measures 1.1 cm in  short axis. There are calcified right hilar and subcarinal nodes. There is a 6 mm calcified nodule in the right lobe of the thyroid gland. No follow-up imaging is recommended. There is no axillary or supraclavicular adenopathy. The thoracic trachea is unremarkable. The thoracic esophagus is normal in thickness. Lungs/Pleura: There is generalized interstitial edema. Small to moderate left and small right layering pleural effusions are present. There is compressive atelectasis or consolidation in the left lower lobe basal segments alongside the effusion. There is ground-glass haziness of the upper to mid lung fields, most likely ground-glass edema, less likely pneumonitis. There is abundant breathing motion artifact. A calcified granuloma noted in the medial right upper lobe anterior segment. No noncalcified nodule is seen through the breathing motion. There is no pneumothorax. There are scattered linear scar-like opacities in the bases. Musculoskeletal: There are degenerative changes of the thoracic spine and multilevel bridging enthesopathy. No primary pathologic process is seen. A bone island is again noted in the right humeral head. The ribcage is intact. Review of the MIP images confirms the above findings. CTA ABDOMEN AND PELVIS FINDINGS VASCULAR Aorta: Moderate calcific plaques. No aneurysm, dissection or stenosis. Celiac: Moderate calcific plaques are noted without flow limiting stenosis or branch occlusion. Circumferential calcification in the branch arteries  is again noted including extensive small branch arterial calcifications in the spleen and liver. SMA: There are moderate calcifications without flow-limiting stenosis. Calcifications continue into the branch arteries without evidence of branch occlusion. Renals: Ostial calcifications associated with 40-50% arterial origin stenosis on the left and 40% arterial origin stenosis on the right. Calcifications continuing to the hila and parenchymal branch  arteries. IMA: There is circumferential calcification and severe origin stenosis. Flow is otherwise preserved. Inflow: Moderate to heavy calcification both common iliac and internal iliac arteries. Vessel stenosis does not exceed 50% in either vessel, with calcifications continuing into the internal iliac branch arteries. There is only mild calcification in the external iliac arteries, without stenosis. Veins: Unopacified except for hepatic veins, which are patent Review of the MIP images confirms the above findings. NON-VASCULAR Hepatobiliary: Respiratory motion limits evaluation. Scattered calcified granulomas are again noted without appreciable mass. There are gallstones with no obvious wall thickening no biliary dilatation is seen through the breathing motion. Pancreas: No abnormality is seen through the breathing motion. Spleen: Interval new capsular contraction is seen over a hypodense abnormality in the superomedial aspect of the spleen, which was previously larger. Findings most likely due to an evolving infarct or a "burned out" infectious process. There are scattered calcified granulomas. No other focal abnormality. Adrenals/Urinary Tract: Extensive renovascular calcifications. Chronic nodular thickening both adrenal glands. Mild bilateral renal volume loss is again shown but there is homogeneous cortical enhancement, with scattered scar-like defects. No hydronephrosis or stone disease is seen. No mass enhancement. There is mild bladder thickening and perivesical haziness concerning for cystitis. Stomach/Bowel: The stomach is contracted. The small bowel is normal caliber. Mesenteric congestive changes noted in lower abdomen and pelvis. Appendix is normal. Most of the large bowel is normal thickness. There are diffuse diverticula. There is moderate diffuse thickening of the wall of the most distal sigmoid and continuing through the rectum with perirectal stranding. Findings consistent with proctocolitis  which was seen on the prior studies as well. Consider follow-up colonoscopy. No bowel pneumatosis is seen. Lymphatic: There is no lymphadenopathy. Reproductive: Status post hysterectomy. No adnexal masses. Other: Small volume of pelvic ascites. Minimal ascites in the pericolic gutters. No free air. No abscess or pneumatosis. Musculoskeletal: Osteopenia and degenerative change of the spine. No aggressive focal pathologic process. Mild generalized body wall anasarca has increased since the last CT. Review of the MIP images confirms the above findings. IMPRESSION: 1. Aortic and branch vessel atherosclerosis without aneurysm or dissection. 2. 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 3. Cardiomegaly with pulmonary venous distention, generalized interstitial edema and left greater than right pleural effusions. Findings consistent with CHF or fluid overload. 4. Ground-glass haziness in the upper to mid lung fields, most likely ground-glass edema, less likely pneumonitis. 5. Compressive atelectasis or consolidation in the left lower lobe basal segments alongside the effusion. 6. Stable mediastinal and right hilar adenopathy. 7. Interval new capsular contraction over a hypodense abnormality in the superomedial aspect of the spleen, previously larger. Findings most likely due to an evolving infarct or a burned out infectious process. 8. Likely cystitis. 9. Cholelithiasis. 10. 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 No bowel pneumatosis. Consider follow-up colonoscopy if not recently performed. This has been seen previously. The etiology could be infectious, inflammatory or ischemic but there is no pneumatosis. 11. Diverticulosis without evidence of diverticulitis. 12. Body wall anasarca increased since the last CT. Aortic Atherosclerosis (ICD10-I70.0). Electronically  Signed   By: Telford Nab M.D.   On: 08/27/2022  06:08    Pending Labs Unresulted Labs (From admission, onward)     Start     Ordered   08/27/22 0643  Lipase, blood  Once,   STAT        08/27/22 0643            Vitals/Pain Today's Vitals   08/27/22 0300 08/27/22 0342 08/27/22 0343 08/27/22 0714  BP:  (!) 145/82  (!) 113/44  Pulse: (!) 102 100  (!) 103  Resp: 15 18  12   Temp:  98.1 F (36.7 C)  97.9 F (36.6 C)  TempSrc:  Oral  Oral  SpO2: 100% 100%  100%  Weight:    99.8 kg  Height:    5\' 6"  (1.676 m)  PainSc:   5  10-Worst pain ever    Isolation Precautions No active isolations  Medications Medications  metoCLOPramide (REGLAN) injection 10 mg (10 mg Intravenous Given 08/27/22 0032)  HYDROmorphone (DILAUDID) injection 1 mg (1 mg Intravenous Given 08/27/22 0032)  iohexol (OMNIPAQUE) 350 MG/ML injection 100 mL (100 mLs Intravenous Contrast Given 08/27/22 0519)  HYDROmorphone (DILAUDID) injection 1 mg (1 mg Intravenous Given 08/27/22 0650)    Mobility manual wheelchair     Focused Assessments    R Recommendations: See Admitting Provider Note  Report given to:   Additional Notes:

## 2022-08-27 NOTE — Plan of Care (Signed)

## 2022-08-27 NOTE — H&P (Addendum)
History and Physical    Patient: Susan [name] [id number] DOB: 07-Jan-1951 DOA: 08/26/2022 DOS: the patient was seen and examined on 08/27/2022 PCP: [name], MD  Patient coming from: SNF  Chief Complaint:  Chief Complaint  Patient presents with   Abdominal Pain   HPI: Susan [name] is a 72 y.o. female with medical history significant of end-stage renal disease on dialysis MWF,HFrEF with associated chronic diastolic heart failure managed with dialysis, peripheral neuropathy, hypertension, hypothyroidism, diabetes mellitus 2,.  Patient presented from nursing facility with sudden onset of epigastric pain associate with nausea vomiting and diarrhea.  Patient reported to me that she had not had any of the symptoms until after dialysis on Monday.  Around 430 she ate some food and developed the symptoms.  She has had profuse diarrhea since that, mixture of thick and watery stools.  She denies any blood in her stools.  On arrival to the ER patient was stable on 2 L nasal cannula oxygen, afebrile, tachycardic with heart rates in the mid 100s, mildly hypertensive with a blood pressure of 149/75.  She was having significant abdominal pain primarily in the epigastrium and right upper quadrant that radiated down across the upper abdomen down to the left lower left quadrant and into the pubic area.  Labs revealed hypokalemia with a potassium of 2.8, BUN 30 and creatinine 344, modestly elevated troponin with a flat trend in the context of chronic kidney disease.  Normal lactic acid 1.5, no leukocytosis.  She did have neutrophils 88%.  Hemoglobin 10.3 and platelets 100,000 noting her baseline platelets are in the 200,000 range.  Her pain did improve somewhat with IV analgesics but has remained persistent and most concentrated in the right upper quadrant and epigastric region.  CT angio chest abdomen and pelvis revealed findings suggestive of proctitis that could be infectious in nature.  Also she was found to  have cholelithiasis and stable capsular contraction over her spleen which had been previously documented during an admission in January 2024.  EDP requested hospitalist evaluation for admission.  Upon my evaluation of the patient after she had arrived to her room in the hospital patient was having significant right upper quadrant pain as well as some epigastric pain with minimal palpation.  She remained quite nauseous and unable to eat.  She reported she was still having some diarrhea.  She stated she was thirsty and on exam she appeared to be clinically dry noting oral mucous membranes were dry and she remained tachycardic.   Review of Systems: As mentioned in the history of present illness. All other systems reviewed and are negative. Past Medical History:  Diagnosis Date   Acute MI ([place]) 1999; 2007; 03/05/21   Anemia    hx   Anginal pain (HCC)    Anxiety    ARF (acute renal failure) ([place]) 06/2017   [facility]   Arthritis    "generalized" (03/15/2014)   CAD (coronary artery disease)    MI in 2000 - MI  2007 - treated bare metal stent (no nuclear since then as 9/11)   Carotid artery disease (HCC)    CHF (congestive heart failure) ([place])    HFrEF 03/06/21   Chronic diastolic heart failure ([place])    a) ECHO (08/2013) EF 55-60% and RV function nl b) RHC (08/2013) RA 4, RV 30/5/7, PA 25/10 (16), PCWP 7, Fick CO/CI 6.3/2.7, PVR 1.5 WU, PA 61 and 66%   Daily headache    "~ every other day; since  I fell in June" (03/15/2014)   Depression    Dyslipidemia    Dyspnea    uses oxygen qhs via [facility]   ESRD (end stage renal disease) ([facility])    Dialysis on Tues Thurs Sat   Exertional shortness of breath    History of kidney stones    HTN (hypertension)    Hypothyroidism    Neuropathy    Obesity    Osteoarthritis    PAF (paroxysmal atrial fibrillation) (HCC)    Peripheral neuropathy    bilateral feet/hands   PONV (postoperative nausea and vomiting)    RBBB (right bundle branch block)     Old   Stroke (HCC)    mini strokes   Syncope    likely due to low blood sugar   Tachycardia    Sinus tachycardia   Type II diabetes mellitus (HCC)    Type II, Lamar Sprinkles libre left upper arm. patient has omnipod insulin pump with Novolin R Insulin   Urinary incontinence    Venous insufficiency    Past Surgical History:  Procedure Laterality Date   ABDOMINAL HYSTERECTOMY  1980's   AMPUTATION Right 02/24/2018   Procedure: RIGHT FOOT GREAT TOE AND 2ND TOE AMPUTATION;  Surgeon: Newt Minion, MD;  Location: [place];  Service: Orthopedics;  Laterality: Right;   AMPUTATION Right 04/30/2018   Procedure: RIGHT TRANSMETATARSAL AMPUTATION;  Surgeon: Newt Minion, MD;  Location: [place];  Service: Orthopedics;  Laterality: Right;   AMPUTATION Right 05/02/2022   Procedure: RIGHT BELOW KNEE AMPUTATION;  Surgeon: Newt Minion, MD;  Location: [place];  Service: Orthopedics;  Laterality: Right;   APPLICATION OF WOUND VAC Right 06/13/2022   Procedure: APPLICATION OF WOUND VAC;  Surgeon: Newt Minion, MD;  Location: [place];  Service: Orthopedics;  Laterality: Right;   AV FISTULA PLACEMENT Left 04/02/2022   Procedure: LEFT ARM ARTERIOVENOUS (AV) FISTULA CREATION;  Surgeon: Serafina Mitchell, MD;  Location: [place];  Service: Vascular;  Laterality: Left;  PERIPHERAL NERVE BLOCK   BASCILIC VEIN TRANSPOSITION Left 07/31/2022   Procedure: LEFT ARM SECOND STAGE BASILIC VEIN TRANSPOSITION;  Surgeon: Serafina Mitchell, MD;  Location: [place];  Service: Vascular;  Laterality: Left;   BIOPSY  05/27/2020   Procedure: BIOPSY;  Surgeon: [name], DO;  [facility];  Service: Endoscopy;;   CATARACT EXTRACTION, BILATERAL Bilateral ?2013   COLONOSCOPY W/ POLYPECTOMY     COLONOSCOPY WITH PROPOFOL N/A 03/13/2019   Procedure: COLONOSCOPY WITH PROPOFOL;  Surgeon: Jerene Bears, MD;  Location: [place];  Service: Gastroenterology;  Laterality: N/A;   CORONARY ANGIOPLASTY WITH STENT PLACEMENT  1999; 2007   "1 + 1"    ERCP N/A 02/03/2022   Procedure: ENDOSCOPIC RETROGRADE CHOLANGIOPANCREATOGRAPHY (ERCP);  Surgeon: Carol Ada, MD;  Location: [place];  Service: Gastroenterology;  Laterality: N/A;   ESOPHAGOGASTRODUODENOSCOPY (EGD) WITH PROPOFOL N/A 03/13/2019   Procedure: ESOPHAGOGASTRODUODENOSCOPY (EGD) WITH PROPOFOL;  Surgeon: Jerene Bears, MD;  Location[facility]COPY;  Service: Gastroenterology;  Laterality: N/A;   ESOPHAGOGASTRODUODENOSCOPY (EGD) WITH PROPOFOL N/A 05/27/2020   Procedure: ESOPHAGOGASTRODUODENOSCOPY (EGD) WITH PROPOFOL;  Surgeon: [name], DO;  [facility];  Service: Endoscopy;  Laterality: N/A;   EYE SURGERY Bilateral    lazer   FLEXIBLE SIGMOIDOSCOPY N/A 05/23/2022   Procedure: FLEXIBLE SIGMOIDOSCOPY;  Surgeon: Carol Ada, MD;  Location: [place];  Service: Gastroenterology;  Laterality: N/A;   FLEXIBLE SIGMOIDOSCOPY N/A 05/24/2022   Procedure: FLEXIBLE SIGMOIDOSCOPY;  Surgeon: Lorenso Courier,  Grace Blight, MD;  Location[facility];  Service: Gastroenterology;  Laterality: N/A;   HEMOSTASIS CLIP PLACEMENT  03/13/2019   Procedure: HEMOSTASIS CLIP PLACEMENT;  Surgeon: Jerene Bears, MD;  Location: [facility] ENDOSCOPY;  Service: Gastroenterology;;   HEMOSTASIS CLIP PLACEMENT  05/23/2022   Procedure: HEMOSTASIS CLIP PLACEMENT;  Surgeon: Carol Ada, MD;  Location: [place];  Service: Gastroenterology;;   HEMOSTASIS CONTROL  05/24/2022   Procedure: HEMOSTASIS CONTROL;  Surgeon: Sharyn Creamer, MD;  Location: [place];  Service: Gastroenterology;;   HOT HEMOSTASIS N/A 05/23/2022   Procedure: HOT HEMOSTASIS (ARGON PLASMA COAGULATION/BICAP);  Surgeon: Carol Ada, MD;  Location: [place];  Service: Gastroenterology;  Laterality: N/A;   I & D EXTREMITY Left 05/05/2022   Procedure: IRRIGATION AND DEBRIDEMENT LEFT ARM AV FISTULA;  Surgeon: Marty Heck, MD;  Location: [place];  Service: Vascular;  Laterality: Left;   INSERTION OF DIALYSIS CATHETER Right  04/02/2022   Procedure: INSERTION OF TUNNELED DIALYSIS CATHETER;  Surgeon: Serafina Mitchell, MD;  Location: [place];  Service: Vascular;  Laterality: Right;   KNEE ARTHROSCOPY Left 10/25/2006   POLYPECTOMY  03/13/2019   Procedure: POLYPECTOMY;  Surgeon: Jerene Bears, MD;  Location: [place];  Service: Gastroenterology;;   REMOVAL OF STONES  02/03/2022   Procedure: REMOVAL OF STONES;  Surgeon: Carol Ada, MD;  Location: [place];  Service: Gastroenterology;;   REVISON OF ARTERIOVENOUS FISTULA Left 08/20/2022   Procedure: REVISON OF LEFT ARM ARTERIOVENOUS FISTULA;  Surgeon: Serafina Mitchell, MD;  Location: [place];  Service: Vascular;  Laterality: Left;   RIGHT HEART CATH N/A 07/24/2017   Procedure: RIGHT HEART CATH;  Surgeon: Jolaine Artist, MD;  Location: [place] CV [facility];  Service: Cardiovascular;  Laterality: N/A;   RIGHT HEART CATHETERIZATION N/A 09/22/2013   Procedure: RIGHT HEART CATH;  Surgeon: Jolaine Artist, MD;  Location: [facility] CATH LAB;  Service: Cardiovascular;  Laterality: N/A;   SHOULDER ARTHROSCOPY WITH OPEN ROTATOR CUFF REPAIR Right 03/14/2014   Procedure: RIGHT SHOULDER ARTHROSCOPY WITH BICEPS RELEASE, OPEN SUBSCAPULA REPAIR, OPEN SUPRASPINATUS REPAIR.;  Surgeon: Meredith Pel, MD;  Location: [place];  Service: Orthopedics;  Laterality: Right;   SPHINCTEROTOMY  02/03/2022   Procedure: SPHINCTEROTOMY;  Surgeon: Carol Ada, MD;  Location: [place];  Service: Gastroenterology;;   STUMP REVISION Right 06/13/2022   Procedure: REVISION RIGHT BELOW KNEE AMPUTATION;  Surgeon: Newt Minion, MD;  Location: [place];  Service: Orthopedics;  Laterality: Right;   TEE WITHOUT CARDIOVERSION N/A 02/04/2022   Procedure: TRANSESOPHAGEAL ECHOCARDIOGRAM (TEE);  Surgeon: Jolaine Artist, MD;  [facility] END[facility];  Service: Cardiovascular;  Laterality: N/A;   THROMBECTOMY W/ EMBOLECTOMY Left 08/20/2022   Procedure: THROMBECTOMY OF LEFT ARM ARTERIOVENOUS FISTULA;  Surgeon:  Serafina Mitchell, MD;  [facility];  Service: Vascular;  Laterality: Left;   TOE AMPUTATION Right 02/24/2018   GREAT TOE AND 2ND TOE AMPUTATION   TUBAL LIGATION  1970's   Social History:  reports that she quit smoking about 24 years ago. Her smoking use included cigarettes. She has a 96.00 pack-year smoking history. She has never used smokeless tobacco. She reports that she does not currently use alcohol. She reports that she does not use drugs.  Allergies  Allergen Reactions   Cephalexin Diarrhea and Other (See Comments)   Codeine Nausea And Vomiting and Other (See Comments)    Family History  Problem Relation Age of Onset   Heart attack Mother 41    Prior to  Admission medications   Medication Sig Start Date End Date Taking? Authorizing Provider  albuterol (VENTOLIN HFA) 108 (90 Base) MCG/ACT inhaler Inhale 1-2 puffs into the lungs every 6 (six) hours as needed for wheezing or shortness of breath.   Yes [provider]  amiodarone (PACERONE) 200 MG tablet Take 1 tablet (200 mg total) by mouth daily. 01/29/22  Yes [place], Maricela Bo, FNP  ascorbic acid (VITAMIN C) 500 MG tablet Take 500 mg by mouth daily.   Yes [provider]  aspirin EC 81 MG tablet Take 1 tablet (81 mg total) by mouth daily with breakfast. 06/04/22  Yes Pahwani, Einar Grad, MD  diphenoxylate-atropine (LOMOTIL) 2.5-0.025 MG tablet Take 1 tablet by mouth 4 (four) times daily as needed for diarrhea or loose stools. 08/19/21  Yes [name], MD  ferrous sulfate 325 (65 FE) MG tablet Take 325 mg by mouth 2 (two) times daily. (1000 & 2100)   Yes [provider]  folic acid (FOLVITE) 1 MG tablet Take 1 mg by mouth daily.   Yes [provider]  hydrALAZINE (APRESOLINE) 25 MG tablet Take 1 tablet (25 mg total) by mouth every 6 (six) hours as needed (SBP>150 or DBP>100). 04/06/22  Yes Nita Sells, MD  hydrocortisone (ANUSOL-HC) 2.5 % rectal cream Place 1 Application rectally 2 (two)  times daily. (1000 & 2100)   Yes [provider]  insulin aspart (NOVOLOG FLEXPEN) 100 UNIT/ML FlexPen Inject 0-15 Units into the skin See admin instructions. Inject 12 units subcutaneously 3 times daily with meals (0900, 1300 & 1700) & as needed via sliding scale insulin CBG <70=Notify NP/MD 71-149=0 units,150-199=4 units, 200-249=6 units,250- 299=10 units, 300-349=12 units, 350-399=15 units, > 400 call MD ( prime pen with 2 units prior  to set dose)   Yes [provider]  insulin glargine (LANTUS SOLOSTAR) 100 UNIT/ML Solostar Pen Inject 40 Units into the skin at bedtime. Patient taking differently: Inject 20 Units into the skin at bedtime. 06/17/22  Yes [place] Eric J, DO  levothyroxine (SYNTHROID) 75 MCG tablet Take 75 mcg by mouth daily before breakfast.   Yes [provider]  linagliptin (TRADJENTA) 5 MG TABS tablet Take 1 tablet (5 mg total) by mouth daily. 04/07/22  Yes Nita Sells, MD  LORazepam (ATIVAN) 0.5 MG tablet Take 1 tablet (0.5 mg total) by mouth every 8 (eight) hours as needed for anxiety. 06/17/22  Yes [place] Donnamarie Poag, DO  midodrine (PROAMATINE) 10 MG tablet Take 10 mg by mouth every Monday, Wednesday, and Friday with hemodialysis.   Yes [provider]  midodrine (PROAMATINE) 5 MG tablet Take 5 mg by mouth See admin instructions. Take 1 tablet (5 mg) by mouth with meals on Tuesdays, Thursdays, Saturdays & Sundays.   Yes [provider]  Multiple Vitamin (DAILY VITE) TABS Take 1 tablet by mouth daily.   Yes [provider]  Nutritional Supplements (FEEDING SUPPLEMENT, NEPRO CARB STEADY,) LIQD Take 237 mLs by mouth daily. (0900)   Yes [provider]  ondansetron (ZOFRAN) 4 MG tablet Take 4 mg by mouth every 8 (eight) hours as needed for nausea.   Yes [provider]  oxyCODONE-acetaminophen (PERCOCET/ROXICET) 5-325 MG tablet Take 1 tablet by mouth every 6 (six) hours as needed. 08/20/22  Yes Baglia,  Corrina, PA-C  OXYGEN Inhale 2 L/min into the lungs at bedtime.   Yes [provider]  pantoprazole (PROTONIX) 40 MG tablet Take 1 tablet (40 mg total) by mouth daily. 05/28/22 05/28/23 Yes Pahwani,  Einar Grad, MD  polyethylene glycol (MIRALAX / GLYCOLAX) 17 g packet Take 17 g by mouth 2 (two) times daily. Patient taking differently: Take 17 g by mouth daily as needed (constipation.). 06/17/22  Yes [place] Donnamarie Poag, DO  pregabalin (LYRICA) 75 MG capsule Take 1 capsule (75 mg total) by mouth daily. 04/07/22  Yes Nita Sells, MD  senna-docusate (SENOKOT-S) 8.6-50 MG tablet Take 2 tablets by mouth 2 (two) times daily. 06/17/22  Yes [place] Eric J, DO  sevelamer carbonate (RENVELA) 800 MG tablet Take 1,600 mg by mouth 3 (three) times daily with meals. (0800, 1200 & 1700)   Yes [provider]  vortioxetine HBr (TRINTELLIX) 20 MG TABS tablet Take 1 tablet (20 mg total) by mouth in the morning. (1000) 08/11/22  Yes [name], [name], MD  Amino Acids-Protein Hydrolys (FEEDING SUPPLEMENT, PRO-STAT SUGAR FREE 64,) LIQD Take 30 mLs by mouth in the morning and at bedtime. (1000 & 1400)    [provider]  blood glucose meter kit and supplies Use up to four times daily as directed. 01/23/22   Ghimire, Henreitta Leber, MD  Continuous Blood Gluc Sensor (FREESTYLE LIBRE 2 SENSOR) MISC  01/14/22   [provider]  Fingerstix Lancets MISC Use as directed to check blood sugars as need up to 4 times daily 01/23/22   Ghimire, Henreitta Leber, MD  fluconazole (DIFLUCAN) 150 MG tablet Take 150 mg by mouth every 3 (three) days.    [provider]  Insulin Pen Needle 32G X 4 MM MISC Use as directed 01/23/22   Jonetta Osgood, MD    Physical Exam: Vitals:   08/26/22 2239 08/27/22 0300 08/27/22 0342 08/27/22 0714  BP:   (!) 145/82 (!) 113/44  Pulse:  (!) 102 100 (!) 103  Resp:  15 18 12   Temp:   98.1 F (36.7 C) 97.9 F (36.6 C)  TempSrc:   Oral Oral  SpO2: 100% 100% 100% 100%  Weight:     99.8 kg  Height:    5\' 6"  (1.676 m)   Constitutional: NAD, calm, comfortable ENT: Oral mucous membranes are dry Respiratory: clear to auscultation bilaterally, no wheezing, no crackles. Normal respiratory effort. No accessory muscle use.  Cardiovascular: Regular but tachycardic rate and rhythm, no murmurs / rubs / gallops. No extremity edema. 2+ pedal pulses.  Hands and distal lower extremities cool to the touch which patient states is baseline. Abdomen: Focal significant tenderness with guarding right upper quadrant and some in the epigastrium also tender over the left lower quadrant.  No masses palpated. No hepatosplenomegaly. Bowel sounds positive.  Musculoskeletal: no clubbing / cyanosis. No joint deformity upper and lower extremities. Good ROM, no contractures.  Prior right BKA normal muscle tone.  Skin: no rashes, lesions, ulcers. No induration Neurologic: CN 2-12 grossly intact. Sensation intact, Strength 5/5 x all 4 extremities.  Psychiatric: Normal judgment and insight. Alert and oriented x 3. Normal mood.   Data Reviewed:  As per HPI  Assessment and Plan: Acute abdominal pain Differential includes acute cholecystitis, infectious colitis Will obtain GI pathogen panel and C. difficile stool collection-Empiric Flagyl for now Given cholelithiasis I obtained an abdominal ultrasound that revealed findings concerning for possible acute cholecystitis as well as possible choledocholithiasis.(Distended gallbladder with small stones and trace pericholecystic fluid/dilated CBD.)  No obstructive LFTs noted on labs May need to proceed with HIDA scan to clarify.  Have deferred to attending MD Ice chips IV Dilaudid for pain  ESRD on dialysis MWF Due today  Potassium actually low and no volume overload Nephrology aware and have written orders Discussed with nephrology patient appearance of volume depletion in context of diarrhea and received approval to give at least a 250 cc normal saline  bolus Typically receives midodrine on dialysis days-resume if can tolerate with sip of water  Volume depletion secondary to profuse diarrhea As above regarding saline replacement Follow-up on GI pathogen panel and C. difficile PCR  Acute thrombocytopenia Hold pharmacological DVT prophylaxis for now Repeat labs in a.m.  Diabetes mellitus 2 N.p.o. so we will check CBGs every 4 hours and provide SSI Hold preadmission Lantus 20 units at bedtime-if CBGs not well-controlled with sliding scale insulin can add in low-dose and titrate up [place] while n.p.o.  History of PAF On amiodarone prior to admission Resume when patient can tolerate oral intake  Hypertension Med rec pending but previously on Apresoline    Advance Care Planning:   Code Status: Full Code   DVT prophylaxis: Thrombocytopenia so hold subcu heparin.  Will apply SCD to left lower extremity only-history of right BKA  Consults: Nephrology  Family Communication: Patient only  Severity of Illness: The appropriate patient status for this patient is INPATIENT. Inpatient status is judged to be reasonable and necessary in order to provide the required intensity of service to ensure the patient's safety. The patient's presenting symptoms, physical exam findings, and initial radiographic and laboratory data in the context of their chronic comorbidities is felt to place them at high risk for further clinical deterioration. Furthermore, it is not anticipated that the patient will be medically stable for discharge from the hospital within 2 midnights of admission.   * I certify that at the point of admission it is my clinical judgment that the patient will require inpatient hospital care spanning beyond 2 midnights from the point of admission due to high intensity of service, high risk for further deterioration and high frequency of surveillance required.*  Author: Erin Hearing, NP 08/27/2022 7:41 AM  For on call review  [email or web address].

## 2022-08-27 NOTE — TOC Initial Note (Addendum)
Transition of Care [facility]) - Initial/Assessment Note    Patient Details  Name: Susan [name] MRN: [id number] Date of Birth: 12-22-1950  [facility] CM/SW Contact:    Milinda Antis, [place] Phone Number: 08/27/2022, 12:01 PM  Clinical Narrative:                 LCSW met with patient at bedside.  The patient is from [facility].  The patient reports being short term at the facility and has been there since last December.  The patient is in agreement with returning when medically ready.    LCSW contacted the facility to confirm the above information and is awaiting a response.  12:40- [facility] confirmed the above.  Insurance authorization approval is needed prior to the patient's return.   TOC following.      Barriers to Discharge: Continued Medical Work up   Patient Goals and CMS Choice Patient states their goals for this hospitalization and ongoing recovery are:: To return to SNF to receive rehab CMS Medicare.gov Compare Post Acute Care list provided to:: Patient Choice offered to / list presented to : Patient      Expected Discharge Plan and Services       Living arrangements for the past 2 months: [place]                                      Prior Living Arrangements/Services Living arrangements for the past 2 months: [place] Lives with:: Facility Resident Patient language and need for interpreter reviewed:: Yes Do you feel safe going back to the place where you live?: Yes      Need for Family Participation in Patient Care: Yes (Comment) Care giver support system in place?: Yes (comment)   Criminal Activity/Legal Involvement Pertinent to Current Situation/Hospitalization: No - Comment as needed  Activities of Daily Living      Permission Sought/Granted   Permission granted to share information with : Yes, Release of Information Signed     Permission granted to share info w AGENCY: SNF ([facility])         Emotional Assessment Appearance:: Appears stated age Attitude/Demeanor/Rapport: Engaged Affect (typically observed): Accepting, Pleasant Orientation: : Oriented to Situation, Oriented to  Time, Oriented to Place, Oriented to Self Alcohol / Substance Use: Not Applicable Psych Involvement: No (comment)  Admission diagnosis:  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 difficile colitis 06/05/2022   Spleen hematoma 06/05/2022   Dehiscence of amputation stump of right lower extremity 06/05/2022   Rectal ulcer 05/27/2022   ESRD on dialysis 05/27/2022   GI bleed 05/23/2022   Difficult intravenous access 05/23/2022   Gangrene of right foot 05/02/2022   S/P BKA (below knee amputation) unilateral, right 05/02/2022   Unspecified protein-calorie malnutrition 04/15/2022   Secondary hyperparathyroidism of renal origin 04/14/2022   Coagulation defect, unspecified 04/09/2022   Acquired absence of other left toe(s) 04/07/2022   Allergy, unspecified, initial encounter 04/07/2022   Dependence on renal dialysis 04/07/2022   Gout due to renal impairment, unspecified site 04/07/2022   Hypertensive heart and chronic  kidney disease with heart failure and with stage 5 chronic kidney disease, or end stage renal disease 04/07/2022   Personal history of transient ischemic attack (TIA), and cerebral infarction without residual deficits 04/07/2022   Renal osteodystrophy 04/07/2022   Venous stasis ulcer of right calf 03/31/2022   Fistula, colovaginal 03/26/2022   Diarrhea 03/26/2022   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   Sepsis without acute organ dysfunction     Bacteremia    Acute pancreatitis 02/01/2022   Abdominal pain 02/01/2022   SIRS (systemic inflammatory response syndrome) 02/01/2022   Transaminitis 02/01/2022   History of anemia due to chronic kidney disease 02/01/2022   Paroxysmal atrial fibrillation 02/01/2022   DKA (diabetic ketoacidosis) 01/14/2022   NSTEMI (non-ST elevated myocardial infarction) 03/05/2021   Acute renal failure superimposed on stage 4 chronic kidney disease 08/22/2020   Hypoalbuminemia 05/25/2020   GERD (gastroesophageal reflux disease) 05/25/2020   Pressure injury of skin 05/17/2020   Type 2 diabetes mellitus with diabetic polyneuropathy, with long-term current use of insulin 03/07/2020   Obesity, Class III, BMI 40-49.9 (morbid obesity) 03/07/2020   Common bile duct (CBD) obstruction 05/28/2019   Benign neoplasm of ascending colon    Benign neoplasm of transverse colon    Benign neoplasm of descending colon    Benign neoplasm of sigmoid colon    Gastric polyps    Hyperkalemia 03/11/2019   Prolonged QT interval 03/11/2019   Acute blood loss anemia 03/11/2019   Onychomycosis 06/21/2018   Osteomyelitis of second toe of right foot    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    PVD (peripheral vascular disease) 10/26/2017   E-coli UTI 07/27/2017   Hypothyroidism 07/27/2017   AKI (acute kidney injury)    PAH (pulmonary artery hypertension)    Impaired ambulation 07/19/2017   Nausea & vomiting 07/15/2017   Leg cramps 02/27/2017   Peripheral edema 01/12/2017   Diabetic neuropathy 11/12/2016   CKD (chronic kidney disease), stage IV 10/24/2015   Anemia of chronic disease 10/03/2015   Generalized anxiety disorder 10/03/2015   Insomnia 10/03/2015   Hyperglycemia due to diabetes mellitus 06/07/2015   Chronic diastolic CHF (congestive heart failure) 06/07/2015   Non compliance with medical treatment 04/17/2014   Rotator cuff tear 03/14/2014   Class 3 obesity 09/23/2013   Chronic HFrEF  (heart failure with reduced ejection fraction) 06/03/2013   Hypotension 12/25/2012   Urinary incontinence    MDD (major depressive disorder) 11/12/2010   RBBB (right bundle branch block)    Coronary artery disease    Hyperlipemia 01/22/2009   Essential hypertension 01/22/2009   PCP:  [name], MD Pharmacy:   [id number] - [place],  - [address] AT [place] [address] [place] [address] Phone: [phone] Fax: [phone]     Social Determinants of Health (SDOH) Social History: SDOH Screenings   Food Insecurity: No Food Insecurity (05/26/2022)  Housing: Low Risk  (05/26/2022)  Transportation Needs: No Transportation Needs (05/26/2022)  Utilities: Not At Risk (05/26/2022)  Alcohol Screen: Low Risk  (08/16/2021)  Depression (PHQ2-9): Low Risk  (04/23/2022)  Financial Resource Strain: Low Risk  (08/16/2021)  Physical Activity: Inactive (08/16/2021)  Social Connections: Moderately Isolated (08/16/2021)  Stress: No Stress Concern Present (08/16/2021)  Tobacco Use: Medium Risk (08/27/2022)   SDOH Interventions:     Readmission Risk Interventions    05/12/2022    5:04 PM 02/07/2022    2:57 PM 01/23/2022   12:38 PM  Readmission Risk  Prevention Plan  Transportation Screening Complete Complete Complete  PCP or Specialist Appt within 3-5 Days   Complete  HRI or [place]   Complete  Social Work Consult for [place] Planning/Counseling   Complete  Palliative Care Screening   Not Applicable  Medication Review Press photographer)  Complete Complete  PCP or Specialist appointment within 3-5 days of discharge  Complete   HRI or [place]  Complete   SW Recovery Care/Counseling Consult Complete Complete   Palliative Care Screening  Not [place] Complete Not Applicable

## 2022-08-27 NOTE — Consult Note (Signed)
Renal Service Consult Note [facility]  Susan [name] 08/27/2022 Sol Blazing, MD Requesting Physician: Dr. Loletha Grayer. Susan [name]  Reason for Consult: ESRD pt w/ abdominal pain HPI: The patient is a 72 y.o. year-old w/ PMH as below who presented to ED today this am for abdominal pain, nausea and vomiting and diarrhea. Symptoms started after HD on Monday 2 days ago w/ N/V and pain. Since then has had mostly diarrhea. In ED VVS. W/u including CT scan and RUQ [name] were ordered and pt was admitted . We are asked to see for ESRD.    Pt seen in room. No c/o's at this time. No SOB, CP or prod cough. Has not missed any HD. C/o some LE swelling. Hx R BKA. Home O2 3 L. R chest TDC, L AVF revised last week.    ROS - denies CP, no joint pain, no HA, no blurry vision, no rash, no diarrhea, no nausea/ vomiting, no dysuria, no difficulty voiding   Past Medical History  Past Medical History:  Diagnosis Date   Acute MI 1999; 2007; 03/05/21   Anemia    hx   Anginal pain    Anxiety    ARF (acute renal failure) 06/2017   [facility]   Arthritis    "generalized" (03/15/2014)   CAD (coronary artery disease)    MI in 2000 - MI  2007 - treated bare metal stent (no nuclear since then as 9/11)   Carotid artery disease    CHF (congestive heart failure)    HFrEF 03/06/21   Chronic diastolic heart failure    a) ECHO (08/2013) EF 55-60% and RV function nl b) RHC (08/2013) RA 4, RV 30/5/7, PA 25/10 (16), PCWP 7, Fick CO/CI 6.3/2.7, PVR 1.5 WU, PA 61 and 66%   Daily headache    "~ every other day; since I fell in June" (03/15/2014)   Depression    Dyslipidemia    Dyspnea    uses oxygen qhs via [place]   ESRD (end stage renal disease)    Dialysis on Tues Thurs Sat   Exertional shortness of breath    History of kidney stones    HTN (hypertension)    Hypothyroidism    Neuropathy    Obesity    Osteoarthritis    PAF (paroxysmal atrial fibrillation)    Peripheral neuropathy    bilateral  feet/hands   PONV (postoperative nausea and vomiting)    RBBB (right bundle branch block)    Old   Stroke    mini strokes   Syncope    likely due to low blood sugar   Tachycardia    Sinus tachycardia   Type II diabetes mellitus    Type II, Lamar Sprinkles libre left upper arm. patient has omnipod insulin pump with Novolin R Insulin   Urinary incontinence    Venous insufficiency    Past Surgical History  Past Surgical History:  Procedure Laterality Date   ABDOMINAL HYSTERECTOMY  1980's   AMPUTATION Right 02/24/2018   Procedure: RIGHT FOOT GREAT TOE AND 2ND TOE AMPUTATION;  Surgeon: Newt Minion, MD;  Location: [place];  Service: Orthopedics;  Laterality: Right;   AMPUTATION Right 04/30/2018   Procedure: RIGHT TRANSMETATARSAL AMPUTATION;  Surgeon: Newt Minion, MD;  Location: [place];  Service: Orthopedics;  Laterality: Right;   AMPUTATION Right 05/02/2022   Procedure: RIGHT BELOW KNEE AMPUTATION;  Surgeon: Newt Minion, MD;  Location: [place];  Service: Orthopedics;  Laterality: Right;  APPLICATION OF WOUND VAC Right 06/13/2022   Procedure: APPLICATION OF WOUND VAC;  Surgeon: Newt Minion, MD;  Location: [place];  Service: Orthopedics;  Laterality: Right;   AV FISTULA PLACEMENT Left 04/02/2022   Procedure: LEFT ARM ARTERIOVENOUS (AV) FISTULA CREATION;  Surgeon: Serafina Mitchell, MD;  Location: [place];  Service: Vascular;  Laterality: Left;  PERIPHERAL NERVE BLOCK   BASCILIC VEIN TRANSPOSITION Left 07/31/2022   Procedure: LEFT ARM SECOND STAGE BASILIC VEIN TRANSPOSITION;  Surgeon: Serafina Mitchell, MD;  Location: [place];  Service: Vascular;  Laterality: Left;   BIOPSY  05/27/2020   Procedure: BIOPSY;  Surgeon: [name], DO;  [facility];  Service: Endoscopy;;   CATARACT EXTRACTION, BILATERAL Bilateral ?2013   COLONOSCOPY W/ POLYPECTOMY     COLONOSCOPY WITH PROPOFOL N/A 03/13/2019   Procedure: COLONOSCOPY WITH PROPOFOL;  Surgeon: Jerene Bears, MD;  Location: [place];   Service: Gastroenterology;  Laterality: N/A;   CORONARY ANGIOPLASTY WITH STENT PLACEMENT  1999; 2007   "1 + 1"   ERCP N/A 02/03/2022   Procedure: ENDOSCOPIC RETROGRADE CHOLANGIOPANCREATOGRAPHY (ERCP);  Surgeon: Carol Ada, MD;  Location: [place];  Service: Gastroenterology;  Laterality: N/A;   ESOPHAGOGASTRODUODENOSCOPY (EGD) WITH PROPOFOL N/A 03/13/2019   Procedure: ESOPHAGOGASTRODUODENOSCOPY (EGD) WITH PROPOFOL;  Surgeon: Jerene Bears, MD;  Location[facility] END[facility]COPY;  Service: Gastroenterology;  Laterality: N/A;   ESOPHAGOGASTRODUODENOSCOPY (EGD) WITH PROPOFOL N/A 05/27/2020   Procedure: ESOPHAGOGASTRODUODENOSCOPY (EGD) WITH PROPOFOL;  Surgeon: [name], DO;  [facility];  Service: Endoscopy;  Laterality: N/A;   EYE SURGERY Bilateral    lazer   FLEXIBLE SIGMOIDOSCOPY N/A 05/23/2022   Procedure: FLEXIBLE SIGMOIDOSCOPY;  Surgeon: Carol Ada, MD;  Location: [place];  Service: Gastroenterology;  Laterality: N/A;   FLEXIBLE SIGMOIDOSCOPY N/A 05/24/2022   Procedure: FLEXIBLE SIGMOIDOSCOPY;  Surgeon: Sharyn Creamer, MD;  Location: [place];  Service: Gastroenterology;  Laterality: N/A;   HEMOSTASIS CLIP PLACEMENT  03/13/2019   Procedure: HEMOSTASIS CLIP PLACEMENT;  Surgeon: Jerene Bears, MD;  Location: [facility] ENDOSCOPY;  Service: Gastroenterology;;   HEMOSTASIS CLIP PLACEMENT  05/23/2022   Procedure: HEMOSTASIS CLIP PLACEMENT;  Surgeon: Carol Ada, MD;  Location: [place];  Service: Gastroenterology;;   HEMOSTASIS CONTROL  05/24/2022   Procedure: HEMOSTASIS CONTROL;  Surgeon: Sharyn Creamer, MD;  Location: [place];  Service: Gastroenterology;;   HOT HEMOSTASIS N/A 05/23/2022   Procedure: HOT HEMOSTASIS (ARGON PLASMA COAGULATION/BICAP);  Surgeon: Carol Ada, MD;  Location: [place];  Service: Gastroenterology;  Laterality: N/A;   I & D EXTREMITY Left 05/05/2022   Procedure: IRRIGATION AND DEBRIDEMENT LEFT ARM AV FISTULA;  Surgeon: Marty Heck, MD;  Location: [place];  Service: Vascular;  Laterality: Left;   INSERTION OF DIALYSIS CATHETER Right 04/02/2022   Procedure: INSERTION OF TUNNELED DIALYSIS CATHETER;  Surgeon: Serafina Mitchell, MD;  Location: [place];  Service: Vascular;  Laterality: Right;   KNEE ARTHROSCOPY Left 10/25/2006   POLYPECTOMY  03/13/2019   Procedure: POLYPECTOMY;  Surgeon: Jerene Bears, MD;  Location: [place];  Service: Gastroenterology;;   REMOVAL OF STONES  02/03/2022   Procedure: REMOVAL OF STONES;  Surgeon: Carol Ada, MD;  Location: [place];  Service: Gastroenterology;;   REVISON OF ARTERIOVENOUS FISTULA Left 08/20/2022   Procedure: REVISON OF LEFT ARM ARTERIOVENOUS FISTULA;  Surgeon: Serafina Mitchell, MD;  Location: [place];  Service: Vascular;  Laterality: Left;   RIGHT HEART CATH N/A 07/24/2017   Procedure: RIGHT  HEART CATH;  Surgeon: Jolaine Artist, MD;  Location: [place] [facility];  Service: Cardiovascular;  Laterality: N/A;   RIGHT HEART CATHETERIZATION N/A 09/22/2013   Procedure: RIGHT HEART CATH;  Surgeon: Jolaine Artist, MD;  Location: [facility] C[facility] LAB;  Service: Cardiovascular;  Laterality: N/A;   SHOULDER ARTHROSCOPY WITH OPEN ROTATOR CUFF REPAIR Right 03/14/2014   Procedure: RIGHT SHOULDER ARTHROSCOPY WITH BICEPS RELEASE, OPEN SUBSCAPULA REPAIR, OPEN SUPRASPINATUS REPAIR.;  Surgeon: Meredith Pel, MD;  Location: [place];  Service: Orthopedics;  Laterality: Right;   SPHINCTEROTOMY  02/03/2022   Procedure: SPHINCTEROTOMY;  Surgeon: Carol Ada, MD;  Location: [place];  Service: Gastroenterology;;   STUMP REVISION Right 06/13/2022   Procedure: REVISION RIGHT BELOW KNEE AMPUTATION;  Surgeon: Newt Minion, MD;  Location: [place];  Service: Orthopedics;  Laterality: Right;   TEE WITHOUT CARDIOVERSION N/A 02/04/2022   Procedure: TRANSESOPHAGEAL ECHOCARDIOGRAM (TEE);  Surgeon: Jolaine Artist, MD;  Location: [facility] ENDOSCOPY;  Service: Cardiovascular;  Laterality: N/A;    THROMBECTOMY W/ EMBOLECTOMY Left 08/20/2022   Procedure: THROMBECTOMY OF LEFT ARM ARTERIOVENOUS FISTULA;  Surgeon: Serafina Mitchell, MD;  [facility];  Service: Vascular;  Laterality: Left;   TOE AMPUTATION Right 02/24/2018   GREAT TOE AND 2ND TOE AMPUTATION   TUBAL LIGATION  15's   Family History  Family History  Problem Relation Age of Onset   Heart attack Mother 32   Social History  reports that she quit smoking about 24 years ago. Her smoking use included cigarettes. She has a 96.00 pack-year smoking history. She has never used smokeless tobacco. She reports that she does not currently use alcohol. She reports that she does not use drugs. Allergies  Allergies  Allergen Reactions   Cephalexin Diarrhea and Other (See Comments)   Codeine Nausea And Vomiting and Other (See Comments)   Home medications Prior to Admission medications   Medication Sig Start Date End Date Taking? Authorizing Provider  albuterol (VENTOLIN HFA) 108 (90 Base) MCG/ACT inhaler Inhale 1-2 puffs into the lungs every 6 (six) hours as needed for wheezing or shortness of breath.   Yes [provider]  amiodarone (PACERONE) 200 MG tablet Take 1 tablet (200 mg total) by mouth daily. 01/29/22  Yes [place], Maricela Bo, FNP  ascorbic acid (VITAMIN C) 500 MG tablet Take 500 mg by mouth daily.   Yes [provider]  aspirin EC 81 MG tablet Take 1 tablet (81 mg total) by mouth daily with breakfast. 06/04/22  Yes Pahwani, Einar Grad, MD  diphenoxylate-atropine (LOMOTIL) 2.5-0.025 MG tablet Take 1 tablet by mouth 4 (four) times daily as needed for diarrhea or loose stools. 08/19/21  Yes [name], MD  ferrous sulfate 325 (65 FE) MG tablet Take 325 mg by mouth 2 (two) times daily. (1000 & 2100)   Yes [provider]  folic acid (FOLVITE) 1 MG tablet Take 1 mg by mouth daily.   Yes [provider]  hydrALAZINE (APRESOLINE) 25 MG tablet Take 1 tablet (25 mg total) by mouth every 6 (six) hours  as needed (SBP>150 or DBP>100). 04/06/22  Yes Nita Sells, MD  hydrocortisone (ANUSOL-HC) 2.5 % rectal cream Place 1 Application rectally 2 (two) times daily. (1000 & 2100)   Yes [provider]  insulin aspart (NOVOLOG FLEXPEN) 100 UNIT/ML FlexPen Inject 0-15 Units into the skin See admin instructions. Inject 12 units subcutaneously 3 times daily with meals (0900, 1300 & 1700) & as needed via sliding scale insulin CBG <  70=Notify NP/MD 71-149=0 units,150-199=4 units, 200-249=6 units,250- 299=10 units, 300-349=12 units, 350-399=15 units, > 400 call MD ( prime pen with 2 units prior  to set dose)   Yes [provider]  insulin glargine (LANTUS SOLOSTAR) 100 UNIT/ML Solostar Pen Inject 40 Units into the skin at bedtime. Patient taking differently: Inject 20 Units into the skin at bedtime. 06/17/22  Yes [place] Eric J, DO  levothyroxine (SYNTHROID) 75 MCG tablet Take 75 mcg by mouth daily before breakfast.   Yes [provider]  linagliptin (TRADJENTA) 5 MG TABS tablet Take 1 tablet (5 mg total) by mouth daily. 04/07/22  Yes Nita Sells, MD  LORazepam (ATIVAN) 0.5 MG tablet Take 1 tablet (0.5 mg total) by mouth every 8 (eight) hours as needed for anxiety. 06/17/22  Yes [place] Donnamarie Poag, DO  midodrine (PROAMATINE) 10 MG tablet Take 10 mg by mouth every Monday, Wednesday, and Friday with hemodialysis.   Yes [provider]  midodrine (PROAMATINE) 5 MG tablet Take 5 mg by mouth See admin instructions. Take 1 tablet (5 mg) by mouth with meals on Tuesdays, Thursdays, Saturdays & Sundays.   Yes [provider]  Multiple Vitamin (DAILY VITE) TABS Take 1 tablet by mouth daily.   Yes [provider]  Nutritional Supplements (FEEDING SUPPLEMENT, NEPRO CARB STEADY,) LIQD Take 237 mLs by mouth daily. (0900)   Yes [provider]  ondansetron (ZOFRAN) 4 MG tablet Take 4 mg by mouth every 8 (eight) hours as needed for nausea.   Yes [provider]  oxyCODONE-acetaminophen (PERCOCET/ROXICET) 5-325 MG tablet Take 1 tablet by mouth every 6 (six) hours as needed. 08/20/22  Yes Baglia, Corrina, PA-C  OXYGEN Inhale 2 L/min into the lungs at bedtime.   Yes [provider]  pantoprazole (PROTONIX) 40 MG tablet Take 1 tablet (40 mg total) by mouth daily. 05/28/22 05/28/23 Yes Pahwani, Einar Grad, MD  polyethylene glycol (MIRALAX / GLYCOLAX) 17 g packet Take 17 g by mouth 2 (two) times daily. Patient taking differently: Take 17 g by mouth daily as needed (constipation.). 06/17/22  Yes [place] Donnamarie Poag, DO  pregabalin (LYRICA) 75 MG capsule Take 1 capsule (75 mg total) by mouth daily. 04/07/22  Yes Nita Sells, MD  senna-docusate (SENOKOT-S) 8.6-50 MG tablet Take 2 tablets by mouth 2 (two) times daily. 06/17/22  Yes [place] Donnamarie Poag, DO  sevelamer carbonate (RENVELA) 800 MG tablet Take 1,600 mg by mouth 3 (three) times daily with meals. (0800, 1200 & 1700)   Yes [provider]  vortioxetine HBr (TRINTELLIX) 20 MG TABS tablet Take 1 tablet (20 mg total) by mouth in the morning. (1000) 08/11/22  Yes [name], [name], MD  Amino Acids-Protein Hydrolys (FEEDING SUPPLEMENT, PRO-STAT SUGAR FREE 64,) LIQD Take 30 mLs by mouth in the morning and at bedtime. (1000 & 1400) Patient not taking: Reported on 08/27/2022    [provider]  blood glucose meter kit and supplies Use up to four times daily as directed. 01/23/22   Ghimire, Henreitta Leber, MD  Continuous Blood Gluc Sensor (FREESTYLE LIBRE 2 SENSOR) MISC  01/14/22   [provider]  Fingerstix Lancets MISC Use as directed to check blood sugars as need up to 4 times daily 01/23/22   Ghimire, Henreitta Leber, MD  fluconazole (DIFLUCAN) 150 MG tablet Take 150 mg by mouth every 3 (three) days. Patient not taking: Reported on 08/27/2022    [provider]  Insulin Pen Needle 32G X 4 MM MISC Use as directed  01/23/22   Jonetta Osgood, MD     Vitals:   08/27/22 1501  08/27/22 1520 08/27/22 1550 08/27/22 1620  BP: (!) 169/80 (!) 173/83 (!) 161/73 138/72  Pulse: (!) 102 (!) 105 (!) 106 (!) 107  Resp: 19     Temp:      TempSrc:      SpO2: 100% 100% 100% 100%  Weight:      Height:       Exam Gen alert, no distress, [place] O2, stable No rash, cyanosis or gangrene Sclera anicteric, throat clear  No jvd or bruits Chest clear bilat to bases, no rales/ wheezing RRR no MRG Abd soft ntnd no mass or ascites +bs GU defer MS R BKA Ext 1- 2+ L > RLE edema Neuro is alert, Ox 3 , nf    RIJ TDC intact/ LUA AVF+bruit     Home meds include - amiodarone, asa, hydralazine 25 mg qid prn, insulin aspart/ glargine, synthroid, tradjenta, ativan prn, midodrine 10mg  tiw pre hd, MVI, nepro carb steady, percocet prn, O2 2L chronic, protonix , lyrica, renvela 1600 mg ac tid, trintellix , prns/ vits/ supps     OP HD: MWF SW  4h  400/800  93kg  2/2 bath  RIJ TDC    Heparin 4000 + 2039midrun - last HD 4/1, post wt 93kg - venofer 50mg  IV weekly - mircera 225 mcg IV q 2 wks, last 3/26, due 4/09   Assessment/ Plan: Abdominal pain / vomiting/ diarrhea - w/u in progress per primary team.  ESRD - on HD MWF. Has not missed HD. Plan HD today.  HTN/ volume - sig LE edema which is moderate. No resp issures. Will need to get bed wts and compare to OP unit numbers. May be losing body wt.  Anemia esrd - Hb 10s, next esa due on 4/09. Follow.  MBD ckd - CCa and phos are in range. Cont binder (renvela) ac tid. Not getting vdra's.  PAF - per pmd PAD - sp R BKA DM2 - on insulin      Rob Larwence Tu  MD [facility] 08/27/2022, 4:51 PM  Recent Labs  Lab 08/26/22 2247 08/27/22 1053 08/27/22 1313  HGB 10.3* 10.6* 9.6*  ALBUMIN 2.8*  --  2.6*  CALCIUM 9.1  --  8.8*  PHOS  --   --  5.5*  CREATININE 3.44* 3.64* 3.52*  K 2.8*  --  2.9*   Inpatient medications:  Chlorhexidine Gluconate Cloth  6 each Topical Q0600   insulin aspart  0-6 Units Subcutaneous Q4H   midodrine  5 mg Oral 3 times  per day on Mon Wed Fri   sodium chloride flush  3 mL Intravenous Q12H    metronidazole 500 mg (08/27/22 1020)   acetaminophen **OR** acetaminophen, HYDROmorphone (DILAUDID) injection, ondansetron **OR** ondansetron (ZOFRAN) IV

## 2022-08-27 NOTE — Progress Notes (Signed)
Pt received to 3M renal room 20 from ED.  Pt oriented to room and call bell.  Pt cleansed and made comfortable.  See admit database and assessment.

## 2022-08-28 ENCOUNTER — Other Ambulatory Visit: Payer: Self-pay

## 2022-08-28 ENCOUNTER — Inpatient Hospital Stay (HOSPITAL_COMMUNITY): Payer: PPO

## 2022-08-28 DIAGNOSIS — R1013 Epigastric pain: Secondary | ICD-10-CM | POA: Diagnosis not present

## 2022-08-28 LAB — CBC
HCT: 34.8 % — ABNORMAL LOW (ref 36.0–46.0)
Hemoglobin: 10.1 g/dL — ABNORMAL LOW (ref 12.0–15.0)
MCH: 22.9 pg — ABNORMAL LOW (ref 26.0–34.0)
MCHC: 29 g/dL — ABNORMAL LOW (ref 30.0–36.0)
MCV: 78.9 fL — ABNORMAL LOW (ref 80.0–100.0)
Platelets: 188 10*3/uL (ref 150–400)
RBC: 4.41 MIL/uL (ref 3.87–5.11)
RDW: 20.8 % — ABNORMAL HIGH (ref 11.5–15.5)
WBC: 8.2 10*3/uL (ref 4.0–10.5)
nRBC: 0 % (ref 0.0–0.2)

## 2022-08-28 LAB — BASIC METABOLIC PANEL
Anion gap: 17 — ABNORMAL HIGH (ref 5–15)
BUN: 18 mg/dL (ref 8–23)
CO2: 23 mmol/L (ref 22–32)
Calcium: 8.8 mg/dL — ABNORMAL LOW (ref 8.9–10.3)
Chloride: 95 mmol/L — ABNORMAL LOW (ref 98–111)
Creatinine, Ser: 2.54 mg/dL — ABNORMAL HIGH (ref 0.44–1.00)
GFR, Estimated: 19 mL/min — ABNORMAL LOW (ref >60)
Glucose, Bld: 216 mg/dL — ABNORMAL HIGH (ref 70–99)
Potassium: 3.6 mmol/L (ref 3.5–5.1)
Sodium: 135 mmol/L (ref 135–145)

## 2022-08-28 LAB — HEMOGLOBIN A1C
Hgb A1c MFr Bld: 7.9 % — ABNORMAL HIGH (ref 4.8–5.6)
Mean Plasma Glucose: 180 mg/dL

## 2022-08-28 LAB — HEPATIC FUNCTION PANEL
ALT: 15 U/L (ref 0–44)
AST: 17 U/L (ref 15–41)
Albumin: 2.7 g/dL — ABNORMAL LOW (ref 3.5–5.0)
Alkaline Phosphatase: 144 U/L — ABNORMAL HIGH (ref 38–126)
Bilirubin, Direct: 0.1 mg/dL (ref 0.0–0.2)
Indirect Bilirubin: 0.7 mg/dL (ref 0.3–0.9)
Total Bilirubin: 0.8 mg/dL (ref 0.3–1.2)
Total Protein: 6.3 g/dL — ABNORMAL LOW (ref 6.5–8.1)

## 2022-08-28 LAB — GLUCOSE, CAPILLARY
Glucose-Capillary: 178 mg/dL — ABNORMAL HIGH (ref 70–99)
Glucose-Capillary: 183 mg/dL — ABNORMAL HIGH (ref 70–99)
Glucose-Capillary: 200 mg/dL — ABNORMAL HIGH (ref 70–99)
Glucose-Capillary: 220 mg/dL — ABNORMAL HIGH (ref 70–99)
Glucose-Capillary: 222 mg/dL — ABNORMAL HIGH (ref 70–99)
Glucose-Capillary: 233 mg/dL — ABNORMAL HIGH (ref 70–99)

## 2022-08-28 LAB — HEPATITIS B SURFACE ANTIBODY, QUANTITATIVE: Hep B S AB Quant (Post): 1837 m[IU]/mL (ref >9.9)

## 2022-08-28 MED ORDER — LORAZEPAM 0.5 MG PO TABS
0.5000 mg | ORAL_TABLET | Freq: Three times a day (TID) | ORAL | Status: DC | PRN
  Administered 2022-08-29 – 2022-09-01 (×2): 0.5 mg via ORAL
  Filled 2022-08-28 (×2): qty 1

## 2022-08-28 MED ORDER — INSULIN GLARGINE-YFGN 100 UNIT/ML ~~LOC~~ SOLN
20.0000 [IU] | Freq: Every day | SUBCUTANEOUS | Status: DC
  Administered 2022-08-29: 20 [IU] via SUBCUTANEOUS
  Filled 2022-08-28 (×3): qty 0.2

## 2022-08-28 MED ORDER — LORAZEPAM 2 MG/ML IJ SOLN
0.2500 mg | Freq: Four times a day (QID) | INTRAMUSCULAR | Status: DC | PRN
  Administered 2022-08-28 – 2022-08-30 (×4): 0.25 mg via INTRAVENOUS
  Filled 2022-08-28 (×4): qty 1

## 2022-08-28 MED ORDER — HEPARIN SODIUM (PORCINE) 5000 UNIT/ML IJ SOLN
5000.0000 [IU] | Freq: Three times a day (TID) | INTRAMUSCULAR | Status: DC
  Administered 2022-08-28 – 2022-08-30 (×5): 5000 [IU] via SUBCUTANEOUS
  Filled 2022-08-28 (×5): qty 1

## 2022-08-28 MED ORDER — PANTOPRAZOLE SODIUM 40 MG PO TBEC
40.0000 mg | DELAYED_RELEASE_TABLET | Freq: Every day | ORAL | Status: DC
  Administered 2022-08-29 – 2022-08-31 (×3): 40 mg via ORAL
  Filled 2022-08-28 (×4): qty 1

## 2022-08-28 MED ORDER — TECHNETIUM TC 99M MEBROFENIN IV KIT
5.2000 | PACK | Freq: Once | INTRAVENOUS | Status: AC | PRN
  Administered 2022-08-28: 5.2 via INTRAVENOUS

## 2022-08-28 MED ORDER — PROCHLORPERAZINE EDISYLATE 10 MG/2ML IJ SOLN
10.0000 mg | Freq: Four times a day (QID) | INTRAMUSCULAR | Status: DC | PRN
  Administered 2022-08-29 – 2022-09-03 (×8): 10 mg via INTRAVENOUS
  Filled 2022-08-28 (×8): qty 2

## 2022-08-28 MED ORDER — LEVOTHYROXINE SODIUM 75 MCG PO TABS
75.0000 ug | ORAL_TABLET | Freq: Every day | ORAL | Status: DC
  Administered 2022-08-30 – 2022-09-05 (×7): 75 ug via ORAL
  Filled 2022-08-28 (×8): qty 1

## 2022-08-28 MED ORDER — VORTIOXETINE HBR 20 MG PO TABS
20.0000 mg | ORAL_TABLET | Freq: Every day | ORAL | Status: DC
  Administered 2022-08-29 – 2022-09-05 (×8): 20 mg via ORAL
  Filled 2022-08-28 (×8): qty 1

## 2022-08-28 MED ORDER — PREGABALIN 75 MG PO CAPS
75.0000 mg | ORAL_CAPSULE | Freq: Every day | ORAL | Status: DC
  Administered 2022-08-29 – 2022-09-05 (×8): 75 mg via ORAL
  Filled 2022-08-28 (×8): qty 1

## 2022-08-28 MED ORDER — SEVELAMER CARBONATE 800 MG PO TABS
1600.0000 mg | ORAL_TABLET | Freq: Three times a day (TID) | ORAL | Status: DC
  Administered 2022-08-29 – 2022-09-05 (×17): 1600 mg via ORAL
  Filled 2022-08-28 (×17): qty 2

## 2022-08-28 NOTE — Progress Notes (Signed)
PROGRESS NOTE    Susan [name]  [id number] DOB: 1950-06-30 DOA: 08/26/2022 PCP: [name], MD  Chief Complaint  Patient presents with   Abdominal Pain    Brief Narrative:   72 yo F with ESRD, diastolic HF, peripheral neuropathy, HTN, hypothyroidism, and T2DM presenting with nausea, vomiting, and abdominal pain. These symptoms developed after eating mac and cheese and ham leftover from Easter. Imaging notable for proctitis with mesenteric congestive changes in the lower abdomen and pelvis. Also with notable RUQ TTP, RUQ US shows distended gallbladder with small stones and trace pericholecystic fluid. Currently on antibiotics to cover proctocolitis.   Assessment & Plan:   Principal Problem:   Abdominal pain  Abdominal pain CT with proctitis with mesenteric congestive changes in lower abdomen and pelvis Continuing abx for now to cover proctitis - notably, hx of bleeding rectal ulcer, oversewn 12/230 by general surgery Flex sig 12/30 with multiple ulcers in rectum  RUQ TTP concerning for cholecystitis, will follow HIDA -> negative GI path panel negative.  C diff PCR negative, antigen positive  Clear liquids for now Will consult gastroenterology due to persistent symptoms which haven't significantly improved with antibiotics Low threshold for repeat CT scan    Volume overload Body wall anasarca increased per imaging Generalized interstitial edema and L>R effusions Ground glass edema Volume per renal -> dialysis per renal    ESRD Per renal    Hypokalemia Improved, follow    Imaging concerning for cystitis Noted, send UA if she makes urine On abx as noted above   Hypodense abnormality in superomedial aspect of spleen Evolving infarct vs burned out infectious process Multiple infarcts per my discussion with rads -> seems like her anticoagulation was stopped due to issues with rectal ulcers, will discuss with surgery whether her anticoagulation can be restarted in the  AM   Hx Afib Hold amiodarone with qtc prolongation Anticoagulation on hold due to hx rectal ulcer --- as above, will discuss whether this can be resumed   T2DM Resume home dose basal SSI Holding tradjenta   Thrombocytopenia Resolved  Prolonged Qtc Repeat EKG Caution with qt prolonging meds Hold amio for now   Hypertension On midodrine with dialysis Has prn hydral? Would stop this at d/c  Hypothyroidism Synthroid    DVT prophylaxis: heparin Code Status: full Family Communication: none Disposition:   Status is: Inpatient Remains inpatient appropriate because: need for inpatient care   Consultants:  GI  Procedures:  none  Antimicrobials:  Anti-infectives (From admission, onward)    Start     Dose/Rate Route Frequency Ordered Stop   08/28/22 0400  metroNIDAZOLE (FLAGYL) IVPB 500 mg        500 mg 100 mL/hr over 60 Minutes Intravenous Every 8 hours 08/27/22 2048     08/27/22 2200  vancomycin (VANCOCIN) 50 mg/mL oral solution SOLN 125 mg  Status:  Discontinued        125 mg Oral Daily at bedtime 08/27/22 1946 08/27/22 2042   08/27/22 2200  vancomycin (VANCOCIN) capsule 125 mg  Status:  Discontinued        125 mg Oral Daily at bedtime 08/27/22 2042 08/27/22 2048   08/27/22 2045  cefTRIAXone (ROCEPHIN) 2 g in sodium chloride 0.9 % 100 mL IVPB        2 g 200 mL/hr over 30 Minutes Intravenous Every 24 hours 08/27/22 1949     08/27/22 1000  metroNIDAZOLE (FLAGYL) IVPB 500 mg  Status:  Discontinued  500 mg 100 mL/hr over 60 Minutes Intravenous Every 12 hours 08/27/22 0848 08/27/22 2048       Subjective: Feels terrible  Objective: Vitals:   08/27/22 2104 08/28/22 0506 08/28/22 0820 08/28/22 1647  BP: 129/66 (!) 143/72 (!) 164/88 (!) 163/64  Pulse: (!) 107 (!) 103 60 (!) 103  Resp: 19 16 16 16   Temp: 98.2 F (36.8 C) 97.7 F (36.5 C) (!) 97.4 F (36.3 C) (!) 97.4 F (36.3 C)  TempSrc:  Oral Oral Oral  SpO2: 100% 100% 100%   Weight:      Height:         Intake/Output Summary (Last 24 hours) at 08/28/2022 1810 Last data filed at 08/28/2022 1700 Gross per 24 hour  Intake 634.96 ml  Output 3400 ml  Net -2765.04 ml   Filed Weights   08/27/22 0714 08/27/22 1448 08/27/22 1835  Weight: 99.8 kg 93.9 kg 90.6 kg    Examination:  General exam: Appears calm and comfortable  Respiratory system: unlabored Cardiovascular system: RRR Gastrointestinal system: TTP in R side Central nervous system: Alert and oriented. No focal neurological deficits. Extremities: R BKA   Data Reviewed: I have personally reviewed following labs and imaging studies  CBC: Recent Labs  Lab 08/26/22 2247 08/27/22 1053 08/27/22 1313 08/28/22 0253  WBC 9.2 7.7 7.5 8.2  NEUTROABS 8.1*  --   --   --   HGB 10.3* 10.6* 9.6* 10.1*  HCT 35.3* 36.4 32.8* 34.8*  MCV 79.1* 80.4 79.4* 78.9*  PLT 100* 200 223 0000000    Basic Metabolic Panel: Recent Labs  Lab 08/26/22 2247 08/27/22 1053 08/27/22 1313 08/28/22 0253  NA 135  --  131* 135  K 2.8*  --  2.9* 3.6  CL 95*  --  93* 95*  CO2 22  --  25 23  GLUCOSE 208*  --  302* 216*  BUN 30*  --  32* 18  CREATININE 3.44* 3.64* 3.52* 2.54*  CALCIUM 9.1  --  8.8* 8.8*  PHOS  --   --  5.5*  --     GFR: Estimated Creatinine Clearance: 22.4 mL/min ([name]) (by C-G formula based on SCr of 2.54 mg/dL (H)).  Liver Function Tests: Recent Labs  Lab 08/26/22 2247 08/27/22 1313 08/28/22 0253  AST 18  --  17  ALT 14  --  15  ALKPHOS 155*  --  144*  BILITOT 0.8  --  0.8  PROT 6.4*  --  6.3*  ALBUMIN 2.8* 2.6* 2.7*    CBG: Recent Labs  Lab 08/27/22 2359 08/28/22 0420 08/28/22 0719 08/28/22 1109 08/28/22 1648  GLUCAP 178* 233* 220* 183* 200*     Recent Results (from the past 240 hour(s))  Gastrointestinal Panel by PCR , Stool     Status: None   Collection Time: 08/27/22  8:45 AM   Specimen: STOOL  Result Value Ref Range Status   Campylobacter species NOT DETECTED NOT DETECTED Final   Plesimonas shigelloides  NOT DETECTED NOT DETECTED Final   Salmonella species NOT DETECTED NOT DETECTED Final   Yersinia enterocolitica NOT DETECTED NOT DETECTED Final   Vibrio species NOT DETECTED NOT DETECTED Final   Vibrio cholerae NOT DETECTED NOT DETECTED Final   Enteroaggregative E coli (EAEC) NOT DETECTED NOT DETECTED Final   Enteropathogenic E coli (EPEC) NOT DETECTED NOT DETECTED Final   Enterotoxigenic E coli (ETEC) NOT DETECTED NOT DETECTED Final   Shiga like toxin producing E coli (STEC) NOT DETECTED NOT DETECTED Final  Shigella/Enteroinvasive E coli (EIEC) NOT DETECTED NOT DETECTED Final   Cryptosporidium NOT DETECTED NOT DETECTED Final   Cyclospora cayetanensis NOT DETECTED NOT DETECTED Final   Entamoeba histolytica NOT DETECTED NOT DETECTED Final   Giardia lamblia NOT DETECTED NOT DETECTED Final   Adenovirus F40/41 NOT DETECTED NOT DETECTED Final   Astrovirus NOT DETECTED NOT DETECTED Final   Norovirus GI/GII NOT DETECTED NOT DETECTED Final   Rotavirus Jeneva Schweizer NOT DETECTED NOT DETECTED Final   Sapovirus (I, II, IV, and V) NOT DETECTED NOT DETECTED Final    Comment: Performed at [facility], [address], [place], [place] [address]  C Difficile Quick Screen w PCR reflex     Status: Abnormal   Collection Time: 08/27/22  8:45 AM   Specimen: STOOL  Result Value Ref Range Status   C Diff antigen POSITIVE (Jaegar Croft) NEGATIVE Final   C Diff toxin NEGATIVE NEGATIVE Final   C Diff interpretation Results are indeterminate. See PCR results.  Final    Comment: Performed at [facility], [place] [address] [place], [place] [address]  C. Diff by PCR, Reflexed     Status: None   Collection Time: 08/27/22  8:45 AM  Result Value Ref Range Status   Toxigenic C. Difficile by PCR NEGATIVE NEGATIVE Final    Comment: Patient is colonized with non toxigenic C. difficile. May not need treatment unless significant symptoms are present. Performed at [facility], [place] [address], [place],  [address]           Radiology Studies: NM Hepatobiliary Liver Func  Result Date: 08/28/2022 CLINICAL DATA:  Right upper quadrant abdominal pain. EXAM: NUCLEAR MEDICINE HEPATOBILIARY IMAGING TECHNIQUE: Sequential images of the abdomen were obtained out to 60 minutes following intravenous administration of radiopharmaceutical. RADIOPHARMACEUTICALS:  5.2 mCi Tc-79m  Choletec IV COMPARISON:  CT scan and ultrasound examinations 08/27/2022 FINDINGS: Symmetric uptake in the liver and fairly prompt excretion into the biliary tree. Activity is seen in the small bowel by 30 minutes. The gallbladder is visualized at 25 minutes. IMPRESSION: No findings for cystic or common bile duct obstruction. Electronically Signed   By: Marijo Sanes M.D.   On: 08/28/2022 16:12   US Abdomen Limited RUQ (LIVER/GB)  Result Date: 08/27/2022 CLINICAL DATA:  151471 RUQ pain 151471 EXAM: ULTRASOUND ABDOMEN LIMITED COMPARISON:  02/01/2022. FINDINGS: The liver demonstrates normal parenchymal echogenicity and homogeneous texture without focal hepatic parenchymal lesions or intrahepatic ductal dilatation. Hepatopetal portal vein. Gallbladder is distended with Benton Tooker couple small shadowing stones. No wall thickening. There is trace pericholecystic fluid identified. Consider HIDA scan to further evaluate for cholecystitis if that is Sonu Kruckenberg concern clinically. CBD measured 0.7cm. Right kidney cyst measuring 1.3 cm was incidentally noted. IMPRESSION: 1. Distended gallbladder with small stones and trace pericholecystic fluid. Consider HIDA scan if there is Jennifer Holland need to further evaluate for cholecystitis. 2. CBD dilatation. MRCP may be helpful to evaluate for choledocholithiasis. Electronically Signed   By: Sammie Bench M.D.   On: 08/27/2022 09:59   CT ANGIO CHEST/ABD/PEL FOR DISSECTION W &/OR WO CONTRAST  Result Date: 08/27/2022 CLINICAL DATA:  Suspected acute aortic syndrome. EXAM: CT ANGIOGRAPHY CHEST, ABDOMEN AND PELVIS TECHNIQUE: Non-contrast CT of the chest  was initially obtained. Multidetector CT imaging through the chest, abdomen and pelvis was performed using the standard protocol during bolus administration of intravenous contrast. Multiplanar reconstructed images and MIPs were obtained and reviewed to evaluate the vascular anatomy. RADIATION DOSE REDUCTION: This exam was performed according  to the departmental dose-optimization program which includes automated exposure control, adjustment of the mA and/or kV according to patient size and/or use of iterative reconstruction technique. CONTRAST:  176mL OMNIPAQUE IOHEXOL 350 MG/ML SOLN COMPARISON:  The chest CT without contrast 06/12/2022, abdomen and pelvis CT no contrast 06/05/2022, and CTA abdomen and pelvis 05/23/2022. FINDINGS: CTA CHEST FINDINGS Cardiovascular: There is mild-to-moderate panchamber cardiomegaly, interval increased. Three-vessel coronary calcifications and likely stents are present. Right IJ double-lumen catheter terminates at the superior cavoatrial junction. Minimal chronic pericardial effusion collecting to the right inferiorly. Pulmonary arteries are normal caliber without visible emboli. The pulmonary veins are distended. There is IVC and hepatic vein contrast reflux which may suggest right heart dysfunction or tricuspid regurgitation. There is moderate to heavy calcification in the aortic arch, mild scattered calcific plaque in the descending segment. There is 80% calcific origin stenosis of the left subclavian artery and at least an 80% calcific origin stenosis of the left vertebral artery. There are innominate and proximal right subclavian artery calcifications which are nonstenosing. Mediastinum/Nodes: Prominent subcarinal lymph nodes are again noted up to 1.4 cm in short axis. Enlarged precarinal lymph node again measures 1.5 cm short axis and is unchanged. Sharay Bellissimo right mid hilar lymph node again measures 1.1 cm in short axis. There are calcified right hilar and subcarinal nodes. There is Brianna Bennett 6  mm calcified nodule in the right lobe of the thyroid gland. No follow-up imaging is recommended. There is no axillary or supraclavicular adenopathy. The thoracic trachea is unremarkable. The thoracic esophagus is normal in thickness. Lungs/Pleura: There is generalized interstitial edema. Small to moderate left and small right layering pleural effusions are present. There is compressive atelectasis or consolidation in the left lower lobe basal segments alongside the effusion. There is ground-glass haziness of the upper to mid lung fields, most likely ground-glass edema, less likely pneumonitis. There is abundant breathing motion artifact. Kaliya Shreiner calcified granuloma noted in the medial right upper lobe anterior segment. No noncalcified nodule is seen through the breathing motion. There is no pneumothorax. There are scattered linear scar-like opacities in the bases. Musculoskeletal: There are degenerative changes of the thoracic spine and multilevel bridging enthesopathy. No primary pathologic process is seen. Debara Kamphuis bone island is again noted in the right humeral head. The ribcage is intact. Review of the MIP images confirms the above findings. CTA ABDOMEN AND PELVIS FINDINGS VASCULAR Aorta: Moderate calcific plaques. No aneurysm, dissection or stenosis. Celiac: Moderate calcific plaques are noted without flow limiting stenosis or branch occlusion. Circumferential calcification in the branch arteries is again noted including extensive small branch arterial calcifications in the spleen and liver. SMA: There are moderate calcifications without flow-limiting stenosis. Calcifications continue into the branch arteries without evidence of branch occlusion. Renals: Ostial calcifications associated with 40-50% arterial origin stenosis on the left and 40% arterial origin stenosis on the right. Calcifications continuing to the hila and parenchymal branch arteries. IMA: There is circumferential calcification and severe origin stenosis.  Flow is otherwise preserved. Inflow: Moderate to heavy calcification both common iliac and internal iliac arteries. Vessel stenosis does not exceed 50% in either vessel, with calcifications continuing into the internal iliac branch arteries. There is only mild calcification in the external iliac arteries, without stenosis. Veins: Unopacified except for hepatic veins, which are patent Review of the MIP images confirms the above findings. NON-VASCULAR Hepatobiliary: Respiratory motion limits evaluation. Scattered calcified granulomas are again noted without appreciable mass. There are gallstones with no obvious wall thickening no biliary dilatation is seen  through the breathing motion. Pancreas: No abnormality is seen through the breathing motion. Spleen: Interval new capsular contraction is seen over Madeline Bebout hypodense abnormality in the superomedial aspect of the spleen, which was previously larger. Findings most likely due to an evolving infarct or Que Meneely "burned out" infectious process. There are scattered calcified granulomas. No other focal abnormality. Adrenals/Urinary Tract: Extensive renovascular calcifications. Chronic nodular thickening both adrenal glands. Mild bilateral renal volume loss is again shown but there is homogeneous cortical enhancement, with scattered scar-like defects. No hydronephrosis or stone disease is seen. No mass enhancement. There is mild bladder thickening and perivesical haziness concerning for cystitis. Stomach/Bowel: The stomach is contracted. The small bowel is normal caliber. Mesenteric congestive changes noted in lower abdomen and pelvis. Appendix is normal. Most of the large bowel is normal thickness. There are diffuse diverticula. There is moderate diffuse thickening of the wall of the most distal sigmoid and continuing through the rectum with perirectal stranding. Findings consistent with proctocolitis which was seen on the prior studies as well. Consider follow-up colonoscopy. No bowel  pneumatosis is seen. Lymphatic: There is no lymphadenopathy. Reproductive: Status post hysterectomy. No adnexal masses. Other: Small volume of pelvic ascites. Minimal ascites in the pericolic gutters. No free air. No abscess or pneumatosis. Musculoskeletal: Osteopenia and degenerative change of the spine. No aggressive focal pathologic process. Mild generalized body wall anasarca has increased since the last CT. Review of the MIP images confirms the above findings. IMPRESSION: 1. Aortic and branch vessel atherosclerosis without aneurysm or dissection. 2. 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 3. Cardiomegaly with pulmonary venous distention, generalized interstitial edema and left greater than right pleural effusions. Findings consistent with CHF or fluid overload. 4. Ground-glass haziness in the upper to mid lung fields, most likely ground-glass edema, less likely pneumonitis. 5. Compressive atelectasis or consolidation in the left lower lobe basal segments alongside the effusion. 6. Stable mediastinal and right hilar adenopathy. 7. Interval new capsular contraction over Zayde Stroupe hypodense abnormality in the superomedial aspect of the spleen, previously larger. Findings most likely due to an evolving infarct or Ranika Mcniel burned out infectious process. 8. Likely cystitis. 9. Cholelithiasis. 10. 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 No bowel pneumatosis. Consider follow-up colonoscopy if not recently performed. This has been seen previously. The etiology could be infectious, inflammatory or ischemic but there is no pneumatosis. 11. Diverticulosis without evidence of diverticulitis. 12. Body wall anasarca increased since the last CT. Aortic Atherosclerosis (ICD10-I70.0). Electronically Signed   By: Telford Nab M.D.   On: 08/27/2022 06:08        Scheduled Meds:  Chlorhexidine Gluconate Cloth  6 each Topical  Q0600   insulin aspart  0-6 Units Subcutaneous Q4H   insulin glargine-yfgn  10 Units Subcutaneous Q supper   midodrine  5 mg Oral 3 times per day on Mon Wed Fri   sodium chloride flush  3 mL Intravenous Q12H   Continuous Infusions:  cefTRIAXone (ROCEPHIN)  IV 2 g (08/27/22 2234)   metronidazole 500 mg (08/28/22 1212)     LOS: 1 day    Time spent: over 30 min    Fayrene Helper, MD [facility]   To contact the attending provider between 7A-7P or the covering provider during after hours 7P-7A, please log into the web site [email or web address] and access using universal [place] password for that web site. If you do not have the password, please call the  hospital operator.  08/28/2022, 6:10 PM

## 2022-08-28 NOTE — Inpatient Diabetes Management (Signed)
Inpatient Diabetes Program Recommendations  AACE/ADA: New Consensus Statement on Inpatient Glycemic Control  Target Ranges:  Prepandial:   less than 140 mg/dL      Peak postprandial:   less than 180 mg/dL (1-2 hours)      Critically ill patients:  140 - 180 mg/dL    Latest Reference Range & Units 08/28/22 04:20 08/28/22 07:19  Glucose-Capillary 70 - 99 mg/dL 233 (H) 220 (H)    Latest Reference Range & Units 08/27/22 08:23 08/27/22 11:16 08/27/22 21:03 08/27/22 23:59  Glucose-Capillary 70 - 99 mg/dL 258 (H) 251 (H) 192 (H) 178 (H)   Review of Glycemic Control  Diabetes history: DM Outpatient Diabetes medications: Lantus 20 units daily, Novolog 12 units TID with meals plus correction, Tradjenta 5 mg daily Current orders for Inpatient glycemic control: Semglee 10 units QPM, Novolog 0-6 units Q4H  Inpatient Diabetes Program Recommendations:    Insulin: Please consider increasing Semgle to 20 units QPM. Once diet advanced, may want to change CBGs to AC&HS and Novolog to 0-9 units AC&HS, and also consider ordering Novolog 3 units TID with meals for meal coverage if patient eats at least 50% of meals.  Thanks, Barnie Alderman, RN, MSN, [place] Diabetes Coordinator Inpatient Diabetes Program [phone] (Team Pager from 8am to [place])

## 2022-08-28 NOTE — Progress Notes (Signed)
Subjective: Seen and examined in room said tolerated dialysis yesterday 3.4 L UF, for HIDA scan today, still reporting some nausea vomiting this a.m.  Objective Vital signs in last 24 hours: Vitals:   08/27/22 1835 08/27/22 2104 08/28/22 0506 08/28/22 0820  BP: (!) 146/74 129/66 (!) 143/72 (!) 164/88  Pulse: (!) 108 (!) 107 (!) 103 60  Resp: 16 19 16 16   Temp: 97.7 F (36.5 C) 98.2 F (36.8 C) 97.7 F (36.5 C) (!) 97.4 F (36.3 C)  TempSrc: Oral  Oral Oral  SpO2: 100% 100% 100% 100%  Weight: 90.6 kg     Height:       Weight change:   Physical Exam: General: Alert elderly female chronically ill-appearing no distress Heart: RRR no MRG Lungs: CTA bilaterally nonlabored breathing Abdomen: NABS, soft, somewhat tender right lower quadrant and upper quadrant, no distention Extremities: 1+ left LE LE, right BKA stump trace edema Dialysis Access: L UA AVF+ bruit,(maturing)  right IJ TDC tach  Home meds include - amiodarone, asa, hydralazine 25 mg qid prn, insulin aspart/ glargine, synthroid, tradjenta, ativan prn, midodrine 10mg  tiw pre hd, MVI, nepro carb steady, percocet prn, O2 2L chronic, protonix , lyrica, renvela 1600 mg ac tid, trintellix , prns/ vits/ supps        OP HD: MWF SW  4h  400/800  93kg  2/2 bath  RIJ TDC    Heparin 4000 + 2057midrun - last HD 4/1, post wt 93kg - venofer 50mg  IV weekly - mircera 225 mcg IV q 2 wks, last 3/26, due 4/09    Problem/Plan:   Abdominal pain / vomiting/ diarrhea - w/u in progress per primary team.  HIDA scan today ESRD - on HD MWF. Has not missed HD.  HD yesterday on schedule HTN/ volume - sig LE edema which is moderate. No resp issures.  HD yesterday/ 4.3  L UF tolerated will need to get bed wts and compare to OP unit numbers. May be losing body wt.  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 Follow.  MBD ckd - CCa and phos are in range. Cont binder (renvela) ac tid. Not getting vdra's.  PAF - per pmd PAD - sp R BKA DM2 - on  insulin  Ernest Haber, PA-C [facility] Beeper [phone] 08/28/2022,11:34 AM  LOS: 1 day   Labs: Basic Metabolic Panel: Recent Labs  Lab 08/26/22 2247 08/27/22 1053 08/27/22 1313 08/28/22 0253  NA 135  --  131* 135  K 2.8*  --  2.9* 3.6  CL 95*  --  93* 95*  CO2 22  --  25 23  GLUCOSE 208*  --  302* 216*  BUN 30*  --  32* 18  CREATININE 3.44* 3.64* 3.52* 2.54*  CALCIUM 9.1  --  8.8* 8.8*  PHOS  --   --  5.5*  --    Liver Function Tests: Recent Labs  Lab 08/26/22 2247 08/27/22 1313 08/28/22 0253  AST 18  --  17  ALT 14  --  15  ALKPHOS 155*  --  144*  BILITOT 0.8  --  0.8  PROT 6.4*  --  6.3*  ALBUMIN 2.8* 2.6* 2.7*   Recent Labs  Lab 08/27/22 0648  LIPASE 22   No results for input(s): "AMMONIA" in the last 168 hours. CBC: Recent Labs  Lab 08/26/22 2247 08/27/22 1053 08/27/22 1313 08/28/22 0253  WBC 9.2 7.7 7.5 8.2  NEUTROABS 8.1*  --   --   --  HGB 10.3* 10.6* 9.6* 10.1*  HCT 35.3* 36.4 32.8* 34.8*  MCV 79.1* 80.4 79.4* 78.9*  PLT 100* 200 223 188   Cardiac Enzymes: No results for input(s): "CKTOTAL", "CKMB", "CKMBINDEX", "TROPONINI" in the last 168 hours. CBG: Recent Labs  Lab 08/27/22 2103 08/27/22 2359 08/28/22 0420 08/28/22 0719 08/28/22 1109  GLUCAP 192* 178* 233* 220* 183*    Studies/Results: US Abdomen Limited RUQ (LIVER/GB)  Result Date: 08/27/2022 CLINICAL DATA:  151471 RUQ pain 151471 EXAM: ULTRASOUND ABDOMEN LIMITED COMPARISON:  02/01/2022. FINDINGS: The liver demonstrates normal parenchymal echogenicity and homogeneous texture without focal hepatic parenchymal lesions or intrahepatic ductal dilatation. Hepatopetal portal vein. Gallbladder is distended with a couple small shadowing stones. No wall thickening. There is trace pericholecystic fluid identified. Consider HIDA scan to further evaluate for cholecystitis if that is a concern clinically. CBD measured 0.7cm. Right kidney cyst measuring 1.3 cm was incidentally  noted. IMPRESSION: 1. Distended gallbladder with small stones and trace pericholecystic fluid. Consider HIDA scan if there is a need to further evaluate for cholecystitis. 2. CBD dilatation. MRCP may be helpful to evaluate for choledocholithiasis. Electronically Signed   By: Sammie Bench M.D.   On: 08/27/2022 09:59   CT ANGIO CHEST/ABD/PEL FOR DISSECTION W &/OR WO CONTRAST  Result Date: 08/27/2022 CLINICAL DATA:  Suspected acute aortic syndrome. EXAM: CT ANGIOGRAPHY CHEST, ABDOMEN AND PELVIS TECHNIQUE: Non-contrast CT of the chest was initially obtained. Multidetector CT imaging through the chest, abdomen and pelvis was performed using the standard protocol during bolus administration of intravenous contrast. Multiplanar reconstructed images and MIPs were obtained and reviewed to evaluate the vascular anatomy. RADIATION DOSE REDUCTION: This exam was performed according to the departmental dose-optimization program which includes automated exposure control, adjustment of the mA and/or kV according to patient size and/or use of iterative reconstruction technique. CONTRAST:  111mL OMNIPAQUE IOHEXOL 350 MG/ML SOLN COMPARISON:  The chest CT without contrast 06/12/2022, abdomen and pelvis CT no contrast 06/05/2022, and CTA abdomen and pelvis 05/23/2022. FINDINGS: CTA CHEST FINDINGS Cardiovascular: There is mild-to-moderate panchamber cardiomegaly, interval increased. Three-vessel coronary calcifications and likely stents are present. Right IJ double-lumen catheter terminates at the superior cavoatrial junction. Minimal chronic pericardial effusion collecting to the right inferiorly. Pulmonary arteries are normal caliber without visible emboli. The pulmonary veins are distended. There is IVC and hepatic vein contrast reflux which may suggest right heart dysfunction or tricuspid regurgitation. There is moderate to heavy calcification in the aortic arch, mild scattered calcific plaque in the descending segment. There  is 80% calcific origin stenosis of the left subclavian artery and at least an 80% calcific origin stenosis of the left vertebral artery. There are innominate and proximal right subclavian artery calcifications which are nonstenosing. Mediastinum/Nodes: Prominent subcarinal lymph nodes are again noted up to 1.4 cm in short axis. Enlarged precarinal lymph node again measures 1.5 cm short axis and is unchanged. A right mid hilar lymph node again measures 1.1 cm in short axis. There are calcified right hilar and subcarinal nodes. There is a 6 mm calcified nodule in the right lobe of the thyroid gland. No follow-up imaging is recommended. There is no axillary or supraclavicular adenopathy. The thoracic trachea is unremarkable. The thoracic esophagus is normal in thickness. Lungs/Pleura: There is generalized interstitial edema. Small to moderate left and small right layering pleural effusions are present. There is compressive atelectasis or consolidation in the left lower lobe basal segments alongside the effusion. There is ground-glass haziness of the upper to mid lung fields, most likely  ground-glass edema, less likely pneumonitis. There is abundant breathing motion artifact. A calcified granuloma noted in the medial right upper lobe anterior segment. No noncalcified nodule is seen through the breathing motion. There is no pneumothorax. There are scattered linear scar-like opacities in the bases. Musculoskeletal: There are degenerative changes of the thoracic spine and multilevel bridging enthesopathy. No primary pathologic process is seen. A bone island is again noted in the right humeral head. The ribcage is intact. Review of the MIP images confirms the above findings. CTA ABDOMEN AND PELVIS FINDINGS VASCULAR Aorta: Moderate calcific plaques. No aneurysm, dissection or stenosis. Celiac: Moderate calcific plaques are noted without flow limiting stenosis or branch occlusion. Circumferential calcification in the branch  arteries is again noted including extensive small branch arterial calcifications in the spleen and liver. SMA: There are moderate calcifications without flow-limiting stenosis. Calcifications continue into the branch arteries without evidence of branch occlusion. Renals: Ostial calcifications associated with 40-50% arterial origin stenosis on the left and 40% arterial origin stenosis on the right. Calcifications continuing to the hila and parenchymal branch arteries. IMA: There is circumferential calcification and severe origin stenosis. Flow is otherwise preserved. Inflow: Moderate to heavy calcification both common iliac and internal iliac arteries. Vessel stenosis does not exceed 50% in either vessel, with calcifications continuing into the internal iliac branch arteries. There is only mild calcification in the external iliac arteries, without stenosis. Veins: Unopacified except for hepatic veins, which are patent Review of the MIP images confirms the above findings. NON-VASCULAR Hepatobiliary: Respiratory motion limits evaluation. Scattered calcified granulomas are again noted without appreciable mass. There are gallstones with no obvious wall thickening no biliary dilatation is seen through the breathing motion. Pancreas: No abnormality is seen through the breathing motion. Spleen: Interval new capsular contraction is seen over a hypodense abnormality in the superomedial aspect of the spleen, which was previously larger. Findings most likely due to an evolving infarct or a "burned out" infectious process. There are scattered calcified granulomas. No other focal abnormality. Adrenals/Urinary Tract: Extensive renovascular calcifications. Chronic nodular thickening both adrenal glands. Mild bilateral renal volume loss is again shown but there is homogeneous cortical enhancement, with scattered scar-like defects. No hydronephrosis or stone disease is seen. No mass enhancement. There is mild bladder thickening and  perivesical haziness concerning for cystitis. Stomach/Bowel: The stomach is contracted. The small bowel is normal caliber. Mesenteric congestive changes noted in lower abdomen and pelvis. Appendix is normal. Most of the large bowel is normal thickness. There are diffuse diverticula. There is moderate diffuse thickening of the wall of the most distal sigmoid and continuing through the rectum with perirectal stranding. Findings consistent with proctocolitis which was seen on the prior studies as well. Consider follow-up colonoscopy. No bowel pneumatosis is seen. Lymphatic: There is no lymphadenopathy. Reproductive: Status post hysterectomy. No adnexal masses. Other: Small volume of pelvic ascites. Minimal ascites in the pericolic gutters. No free air. No abscess or pneumatosis. Musculoskeletal: Osteopenia and degenerative change of the spine. No aggressive focal pathologic process. Mild generalized body wall anasarca has increased since the last CT. Review of the MIP images confirms the above findings. IMPRESSION: 1. Aortic and branch vessel atherosclerosis without aneurysm or dissection. 2. 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 3. Cardiomegaly with pulmonary venous distention, generalized interstitial edema and left greater than right pleural effusions. Findings consistent with CHF or fluid overload. 4. Ground-glass haziness in the upper to mid lung fields, most likely ground-glass edema, less likely pneumonitis.  5. Compressive atelectasis or consolidation in the left lower lobe basal segments alongside the effusion. 6. Stable mediastinal and right hilar adenopathy. 7. Interval new capsular contraction over a hypodense abnormality in the superomedial aspect of the spleen, previously larger. Findings most likely due to an evolving infarct or a burned out infectious process. 8. Likely cystitis. 9. Cholelithiasis. 10. Proctitis with mesenteric congestive changes in the  lower abdomen and pelvis, and mild pelvic ascites. No bowel pneumatosis. Consider follow-up colonoscopy if not recently performed. This has been seen previously. The etiology could be infectious, inflammatory or ischemic but there is no pneumatosis. 11. Diverticulosis without evidence of diverticulitis. 12. Body wall anasarca increased since the last CT. Aortic Atherosclerosis (ICD10-I70.0). Electronically Signed   By: Telford Nab M.D.   On: 08/27/2022 06:08   Medications:  cefTRIAXone (ROCEPHIN)  IV 2 g (08/27/22 2234)   metronidazole 500 mg (08/28/22 0429)    Chlorhexidine Gluconate Cloth  6 each Topical Q0600   insulin aspart  0-6 Units Subcutaneous Q4H   insulin glargine-yfgn  10 Units Subcutaneous Q supper   midodrine  5 mg Oral 3 times per day on Mon Wed Fri   sodium chloride flush  3 mL Intravenous Q12H

## 2022-08-29 ENCOUNTER — Other Ambulatory Visit (HOSPITAL_COMMUNITY): Payer: PPO

## 2022-08-29 DIAGNOSIS — R1013 Epigastric pain: Secondary | ICD-10-CM | POA: Diagnosis not present

## 2022-08-29 LAB — COMPREHENSIVE METABOLIC PANEL
ALT: 14 U/L (ref 0–44)
AST: 14 U/L — ABNORMAL LOW (ref 15–41)
Albumin: 2.6 g/dL — ABNORMAL LOW (ref 3.5–5.0)
Alkaline Phosphatase: 128 U/L — ABNORMAL HIGH (ref 38–126)
Anion gap: 18 — ABNORMAL HIGH (ref 5–15)
BUN: 27 mg/dL — ABNORMAL HIGH (ref 8–23)
CO2: 23 mmol/L (ref 22–32)
Calcium: 8.7 mg/dL — ABNORMAL LOW (ref 8.9–10.3)
Chloride: 93 mmol/L — ABNORMAL LOW (ref 98–111)
Creatinine, Ser: 3.69 mg/dL — ABNORMAL HIGH (ref 0.44–1.00)
GFR, Estimated: 12 mL/min — ABNORMAL LOW (ref >60)
Glucose, Bld: 220 mg/dL — ABNORMAL HIGH (ref 70–99)
Potassium: 3 mmol/L — ABNORMAL LOW (ref 3.5–5.1)
Sodium: 134 mmol/L — ABNORMAL LOW (ref 135–145)
Total Bilirubin: 0.9 mg/dL (ref 0.3–1.2)
Total Protein: 5.9 g/dL — ABNORMAL LOW (ref 6.5–8.1)

## 2022-08-29 LAB — CBC
HCT: 33 % — ABNORMAL LOW (ref 36.0–46.0)
Hemoglobin: 9.4 g/dL — ABNORMAL LOW (ref 12.0–15.0)
MCH: 23 pg — ABNORMAL LOW (ref 26.0–34.0)
MCHC: 28.5 g/dL — ABNORMAL LOW (ref 30.0–36.0)
MCV: 80.9 fL (ref 80.0–100.0)
Platelets: 181 10*3/uL (ref 150–400)
RBC: 4.08 MIL/uL (ref 3.87–5.11)
RDW: 20.3 % — ABNORMAL HIGH (ref 11.5–15.5)
WBC: 5.6 10*3/uL (ref 4.0–10.5)
nRBC: 0 % (ref 0.0–0.2)

## 2022-08-29 LAB — URINALYSIS, W/ REFLEX TO CULTURE (INFECTION SUSPECTED)
Glucose, UA: 150 mg/dL — AB
Ketones, ur: 5 mg/dL — AB
Nitrite: NEGATIVE
Protein, ur: 100 mg/dL — AB
Specific Gravity, Urine: 1.024 (ref 1.005–1.030)
pH: 5 (ref 5.0–8.0)

## 2022-08-29 LAB — GLUCOSE, CAPILLARY
Glucose-Capillary: 198 mg/dL — ABNORMAL HIGH (ref 70–99)
Glucose-Capillary: 166 mg/dL — ABNORMAL HIGH (ref 70–99)
Glucose-Capillary: 144 mg/dL — ABNORMAL HIGH (ref 70–99)
Glucose-Capillary: 157 mg/dL — ABNORMAL HIGH (ref 70–99)

## 2022-08-29 MED ORDER — SODIUM CHLORIDE 0.9 % IV SOLN
6.2500 mg | Freq: Four times a day (QID) | INTRAVENOUS | Status: DC | PRN
  Administered 2022-08-29 – 2022-08-30 (×2): 6.25 mg via INTRAVENOUS
  Filled 2022-08-29 (×4): qty 0.25

## 2022-08-29 MED ORDER — PROMETHAZINE HCL 12.5 MG PO TABS
25.0000 mg | ORAL_TABLET | Freq: Four times a day (QID) | ORAL | Status: DC | PRN
  Administered 2022-08-29: 25 mg via ORAL
  Filled 2022-08-29 (×2): qty 1

## 2022-08-29 MED ORDER — PROMETHAZINE HCL 25 MG RE SUPP: 25.0000 mg | Freq: Four times a day (QID) | RECTAL | Status: DC | PRN

## 2022-08-29 MED ORDER — HEPARIN SODIUM (PORCINE) 1000 UNIT/ML IJ SOLN
2000.0000 [IU] | Freq: Once | INTRAMUSCULAR | Status: AC
  Administered 2022-08-29: 2000 [IU] via INTRAVENOUS

## 2022-08-29 MED ORDER — HEPARIN SODIUM (PORCINE) 1000 UNIT/ML IJ SOLN
3800.0000 [IU] | Freq: Once | INTRAMUSCULAR | Status: AC
  Administered 2022-08-29: 3800 [IU]

## 2022-08-29 MED ORDER — HEPARIN SODIUM (PORCINE) 1000 UNIT/ML IJ SOLN
INTRAMUSCULAR | Status: AC
  Administered 2022-08-29: 4000 [IU]
  Filled 2022-08-29: qty 10

## 2022-08-29 NOTE — Progress Notes (Signed)
Two doses of Dilaudid were wasted by Lawernce Ion RN and witnessed by Louie Bun RN.  The doses were drawn from the pyxis at 0804 and 1505.  After, wasting into stericycle the nurse noticed an additional dose requiring waste.  A third dose had not been drawn at this time.  It was assumed that one of the waste was actually from a dose the night before at 2147.  Pharmacy notified and provided guidance to make note in chart and email appropriate staff.

## 2022-08-29 NOTE — Progress Notes (Signed)
   08/29/22 1345  Vitals  Pulse Rate (!) 107  Resp 12  BP (!) 143/74  SpO2 100 %  O2 Device Nasal Cannula  Type of Weight Post-Dialysis  Oxygen Therapy  O2 Flow Rate (L/min) 3 L/min  Patient Activity (if Appropriate) In bed  Pulse Oximetry Type Continuous  Oximetry Probe Site Changed No  Post Treatment  Dialyzer Clearance Lightly streaked  Duration of HD Treatment -hour(s) 4 hour(s)  Hemodialysis Intake (mL) 0 mL  Liters Processed 88  Fluid Removed (mL) 2000 mL  Tolerated HD Treatment Yes  Post-Hemodialysis Comments Nausea status provided to primary care RN to follow up with   Received patient in bed to unit.  Alert and oriented.  Informed consent signed and in chart.   TX duration:4.0  Patient tolerated well.  Transported back to the room  Alert, without acute distress.  Hand-off given to patient's nurse.   Access used: [email or web address] Access issues:  No complications Total UF removed: 2000 Medication(s) given: ativan 0.25mg  iv x 1   [facility]

## 2022-08-29 NOTE — Progress Notes (Signed)
PROGRESS NOTE    Susan [name]  EVO:[id number] DOB: [birth year] DOA: 08/26/2022 PCP: Donita Brooks, MD  Chief Complaint  Patient presents with   Abdominal Pain    Brief Narrative:   72 yo F with ESRD, diastolic HF, peripheral neuropathy, HTN, hypothyroidism, and T2DM presenting with nausea, vomiting, and abdominal pain. These symptoms developed after eating mac and cheese and ham leftover from Easter. Imaging notable for proctitis with mesenteric congestive changes in the lower abdomen and pelvis. Also with notable RUQ TTP, RUQ US shows distended gallbladder with small stones and trace pericholecystic fluid. Currently on antibiotics to cover proctocolitis.   Assessment & Plan:   Principal Problem:   Abdominal pain  Abdominal pain  Intractable Nausea CT with proctitis with mesenteric congestive changes in lower abdomen and pelvis Continuing abx for now to cover proctitis - notably, hx of bleeding rectal ulcer, oversewn 12/230 by general surgery Flex sig 12/30 with multiple ulcers in rectum  RUQ TTP concerning for cholecystitis, will follow HIDA -> negative GI path panel negative.  C diff PCR negative, antigen positive  Clear liquids for now Will consult gastroenterology due to persistent symptoms which haven't significantly improved with antibiotics, appreciate their assistance Antiemetics prn Low threshold for repeat CT scan    Volume overload Body wall anasarca increased per imaging Generalized interstitial edema and L>R effusions Ground glass edema Volume per renal -> dialysis per renal    ESRD Per renal    Hypokalemia Improved, follow    Imaging concerning for cystitis Noted, send UA if she makes urine On abx as noted above   Hypodense abnormality in superomedial aspect of spleen Evolving infarct vs burned out infectious process Multiple infarcts per my discussion with rads -> seems like her anticoagulation was stopped due to issues with rectal ulcers, will  discuss with surgery whether her anticoagulation can be restarted   Hx Afib Hold amiodarone with qtc prolongation Anticoagulation on hold due to hx rectal ulcer --- as above, will discuss whether this can be resumed   T2DM Resume home dose basal SSI Holding tradjenta   Thrombocytopenia Resolved  Prolonged Qtc Repeat EKG -> improved Qtc 442 Caution with qt prolonging meds Hold amio for now   Hypertension On midodrine with dialysis Has prn hydral? Would stop this at d/c  Hypothyroidism Synthroid    DVT prophylaxis: heparin Code Status: full Family Communication: none Disposition:   Status is: Inpatient Remains inpatient appropriate because: need for inpatient care   Consultants:  GI  Procedures:  none  Antimicrobials:  Anti-infectives (From admission, onward)    Start     Dose/Rate Route Frequency Ordered Stop   08/28/22 0400  metroNIDAZOLE (FLAGYL) IVPB 500 mg        500 mg 100 mL/hr over 60 Minutes Intravenous Every 8 hours 08/27/22 2048     08/27/22 2200  vancomycin (VANCOCIN) 50 mg/mL oral solution SOLN 125 mg  Status:  Discontinued        125 mg Oral Daily at bedtime 08/27/22 1946 08/27/22 2042   08/27/22 2200  vancomycin (VANCOCIN) capsule 125 mg  Status:  Discontinued        125 mg Oral Daily at bedtime 08/27/22 2042 08/27/22 2048   08/27/22 2045  cefTRIAXone (ROCEPHIN) 2 g in sodium chloride 0.9 % 100 mL IVPB        2 g 200 mL/hr over 30 Minutes Intravenous Every 24 hours 08/27/22 1949     08/27/22 1000  metroNIDAZOLE (FLAGYL) IVPB 500 mg  Status:  Discontinued        500 mg 100 mL/hr over 60 Minutes Intravenous Every 12 hours 08/27/22 0848 08/27/22 2048       Subjective: Continued nausea  Objective: Vitals:   08/29/22 1330 08/29/22 1345 08/29/22 1400 08/29/22 1447  BP: (!) 144/78 (!) 143/74 (!) 130/91 (!) 121/99  Pulse: (!) 107 (!) 107 (!) 108 (!) 106  Resp: 14 12 18 17   Temp:    (!) 97.5 F (36.4 C)  TempSrc:    Oral  SpO2: 100% 100%  100% 100%  Weight: 89.1 kg     Height:        Intake/Output Summary (Last 24 hours) at 08/29/2022 1630 Last data filed at 08/29/2022 1345 Gross per 24 hour  Intake 500 ml  Output 2000 ml  Net -1500 ml   Filed Weights   08/29/22 0845 08/29/22 0900 08/29/22 1330  Weight: 90.3 kg 90.3 kg 89.1 kg    Examination:  General: No acute distress. Cardiovascular: RRR Lungs: unlabored Abdomen: TTP on RUQ and RLQ seems slightly improved today, suprapubic TTP as well Neurological: Alert and oriented 3. Moves all extremities 4 with equal strength. Cranial nerves II through XII grossly intact. Extremities: R BKA    Data Reviewed: I have personally reviewed following labs and imaging studies  CBC: Recent Labs  Lab 08/26/22 2247 08/27/22 1053 08/27/22 1313 08/28/22 0253 08/29/22 0900  WBC 9.2 7.7 7.5 8.2 5.6  NEUTROABS 8.1*  --   --   --   --   HGB 10.3* 10.6* 9.6* 10.1* 9.4*  HCT 35.3* 36.4 32.8* 34.8* 33.0*  MCV 79.1* 80.4 79.4* 78.9* 80.9  PLT 100* 200 223 188 181    Basic Metabolic Panel: Recent Labs  Lab 08/26/22 2247 08/27/22 1053 08/27/22 1313 08/28/22 0253 08/29/22 0900  NA 135  --  131* 135 134*  K 2.8*  --  2.9* 3.6 3.0*  CL 95*  --  93* 95* 93*  CO2 22  --  25 23 23   GLUCOSE 208*  --  302* 216* 220*  BUN 30*  --  32* 18 27*  CREATININE 3.44* 3.64* 3.52* 2.54* 3.69*  CALCIUM 9.1  --  8.8* 8.8* 8.7*  PHOS  --   --  5.5*  --   --     GFR: Estimated Creatinine Clearance: 15.3 mL/min (Alec Jaros) (by C-G formula based on SCr of 3.69 mg/dL (H)).  Liver Function Tests: Recent Labs  Lab 08/26/22 2247 08/27/22 1313 08/28/22 0253 08/29/22 0900  AST 18  --  17 14*  ALT 14  --  15 14  ALKPHOS 155*  --  144* 128*  BILITOT 0.8  --  0.8 0.9  PROT 6.4*  --  6.3* 5.9*  ALBUMIN 2.8* 2.6* 2.7* 2.6*    CBG: Recent Labs  Lab 08/28/22 1648 08/28/22 2111 08/29/22 0732 08/29/22 1449 08/29/22 1614  GLUCAP 200* 222* 198* 166* 144*     Recent Results (from the past  240 hour(s))  Gastrointestinal Panel by PCR , Stool     Status: None   Collection Time: 08/27/22  8:45 AM   Specimen: STOOL  Result Value Ref Range Status   Campylobacter species NOT DETECTED NOT DETECTED Final   Plesimonas shigelloides NOT DETECTED NOT DETECTED Final   Salmonella species NOT DETECTED NOT DETECTED Final   Yersinia enterocolitica NOT DETECTED NOT DETECTED Final   Vibrio species NOT DETECTED NOT DETECTED Final   Vibrio cholerae NOT DETECTED NOT DETECTED Final  Enteroaggregative E coli (EAEC) NOT DETECTED NOT DETECTED Final   Enteropathogenic E coli (EPEC) NOT DETECTED NOT DETECTED Final   Enterotoxigenic E coli (ETEC) NOT DETECTED NOT DETECTED Final   Shiga like toxin producing E coli (STEC) NOT DETECTED NOT DETECTED Final   Shigella/Enteroinvasive E coli (EIEC) NOT DETECTED NOT DETECTED Final   Cryptosporidium NOT DETECTED NOT DETECTED Final   Cyclospora cayetanensis NOT DETECTED NOT DETECTED Final   Entamoeba histolytica NOT DETECTED NOT DETECTED Final   Giardia lamblia NOT DETECTED NOT DETECTED Final   Adenovirus F40/41 NOT DETECTED NOT DETECTED Final   Astrovirus NOT DETECTED NOT DETECTED Final   Norovirus GI/GII NOT DETECTED NOT DETECTED Final   Rotavirus Parley Pidcock NOT DETECTED NOT DETECTED Final   Sapovirus (I, II, IV, and V) NOT DETECTED NOT DETECTED Final    Comment: Performed at [facility], [address] [place], [place] [address]  C Difficile Quick Screen w PCR reflex     Status: Abnormal   Collection Time: 08/27/22  8:45 AM   Specimen: STOOL  Result Value Ref Range Status   C Diff antigen POSITIVE ([place]) NEGATIVE Final   C Diff toxin NEGATIVE NEGATIVE Final   C Diff interpretation Results are indeterminate. See PCR results.  Final    Comment: Performed at [facility], [address] [place], [place] [address]  C. Diff by PCR, Reflexed     Status: None   Collection Time: 08/27/22  8:45 AM  Result Value Ref Range Status   Toxigenic C. Difficile  by PCR NEGATIVE NEGATIVE Final    Comment: Patient is colonized with non toxigenic C. difficile. May not need treatment unless significant symptoms are present. Performed at [facility], [address] [place], [place] [address]          Radiology Studies: NM Hepatobiliary Liver Func  Result Date: 08/28/2022 CLINICAL DATA:  Right upper quadrant abdominal pain. EXAM: NUCLEAR MEDICINE HEPATOBILIARY IMAGING TECHNIQUE: Sequential images of the abdomen were obtained out to 60 minutes following intravenous administration of radiopharmaceutical. RADIOPHARMACEUTICALS:  5.2 mCi Tc-4329m  Choletec IV COMPARISON:  CT scan and ultrasound examinations 08/27/2022 FINDINGS: Symmetric uptake in the liver and fairly prompt excretion into the biliary tree. Activity is seen in the small bowel by 30 minutes. The gallbladder is visualized at 25 minutes. IMPRESSION: No findings for cystic or common bile duct obstruction. Electronically Signed   By: Rudie MeyerP.  Gallerani M.D.   On: 08/28/2022 16:12        Scheduled Meds:  Chlorhexidine Gluconate Cloth  6 each Topical Q0600   heparin injection (subcutaneous)  5,000 Units Subcutaneous Q8H   insulin aspart  0-6 Units Subcutaneous Q4H   insulin glargine-yfgn  20 Units Subcutaneous Q supper   levothyroxine  75 mcg Oral QAC breakfast   midodrine  5 mg Oral 3 times per day on Mon Wed Fri   pantoprazole  40 mg Oral Daily   pregabalin  75 mg Oral Daily   sevelamer carbonate  1,600 mg Oral TID WC   sodium chloride flush  3 mL Intravenous Q12H   vortioxetine HBr  20 mg Oral Daily   Continuous Infusions:  cefTRIAXone (ROCEPHIN)  IV 2 g (08/28/22 2210)   metronidazole 500 mg (08/29/22 1512)   promethazine (PHENERGAN) injection (IM or IVPB)       LOS: 2 days    Time spent: over 30 min    Lacretia Nicksaldwell Powell, MD [facility]   To contact the attending  provider between 7A-7P or the covering provider during after hours 7P-7A, please log into the web site  [email or web address] and access using universal [place] password for that web site. If you do not have the password, please call the hospital operator.  08/29/2022, 4:30 PM

## 2022-08-29 NOTE — TOC Progression Note (Signed)
Transition of Care [facility]) - Initial/Assessment Note    Patient Details  Name: Susan [name] MRN: [id number] Date of Birth: 07/16/1950  Transition of Care [facility]) CM/SW Contact:    Ralene Bathe, LCSWA Phone Number: 08/29/2022, 3:51 PM  Clinical Narrative:                 LCSW contacted MD and requested that PT/OT orders be added as insurance authorization approval will be needed prior to the patient's return to facility.  TOC following.    Barriers to Discharge: Continued Medical Work up   Patient Goals and CMS Choice Patient states their goals for this hospitalization and ongoing recovery are:: To return to SNF to receive rehab CMS Medicare.gov Compare Post Acute Care list provided to:: Patient Choice offered to / list presented to : Patient      Expected Discharge Plan and Services       Living arrangements for the past 2 months: Skilled Nursing Facility Expected Discharge Date: 08/30/22                                    Prior Living Arrangements/Services Living arrangements for the past 2 months: Skilled Nursing Facility Lives with:: Facility Resident Patient language and need for interpreter reviewed:: Yes Do you feel safe going back to the place where you live?: Yes      Need for Family Participation in Patient Care: Yes (Comment) Care giver support system in place?: Yes (comment)   Criminal Activity/Legal Involvement Pertinent to Current Situation/Hospitalization: No - Comment as needed  Activities of Daily Living Home Assistive Devices/Equipment: Oxygen, Hoyer Lift ADL Screening (condition at time of admission) Patient's cognitive ability adequate to safely complete daily activities?: Yes Is the patient deaf or have difficulty hearing?: No Does the patient have difficulty seeing, even when wearing glasses/contacts?: No Does the patient have difficulty concentrating, remembering, or making decisions?: Yes Patient able to express need for assistance with  ADLs?: Yes Does the patient have difficulty dressing or bathing?: Yes Independently performs ADLs?: No Communication: Dependent Is this a change from baseline?: Pre-admission baseline Dressing (OT): Dependent Is this a change from baseline?: Pre-admission baseline Grooming: Dependent Is this a change from baseline?: Pre-admission baseline Feeding: Independent Bathing: Dependent Is this a change from baseline?: Pre-admission baseline Toileting: Dependent Is this a change from baseline?: Pre-admission baseline In/Out Bed: Dependent Is this a change from baseline?: Pre-admission baseline Does the patient have difficulty walking or climbing stairs?: Yes Weakness of Legs: Both Weakness of Arms/Hands: Both  Permission Sought/Granted   Permission granted to share information with : Yes, Release of Information Signed     Permission granted to share info w AGENCY: SNF ([facility])        Emotional Assessment Appearance:: Appears stated age Attitude/Demeanor/Rapport: Engaged Affect (typically observed): Accepting, Pleasant Orientation: : Oriented to Situation, Oriented to  Time, Oriented to Place, Oriented to Self Alcohol / Substance Use: Not Applicable Psych Involvement: No (comment)  Admission diagnosis:  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 difficile colitis 06/05/2022   Spleen hematoma 06/05/2022   Dehiscence of amputation stump of right  lower extremity 06/05/2022   Rectal ulcer 05/27/2022   ESRD on dialysis 05/27/2022   GI bleed 05/23/2022   Difficult intravenous access 05/23/2022   Gangrene of right foot 05/02/2022   S/P  BKA (below knee amputation) unilateral, right 05/02/2022   Unspecified protein-calorie malnutrition 04/15/2022   Secondary hyperparathyroidism of renal origin 04/14/2022   Coagulation defect, unspecified 04/09/2022   Acquired absence of other left toe(s) 04/07/2022   Allergy, unspecified, initial encounter 04/07/2022   Dependence on renal dialysis 04/07/2022   Gout due to renal impairment, unspecified site 04/07/2022   Hypertensive heart and chronic kidney disease with heart failure and with stage 5 chronic kidney disease, or end stage renal disease 04/07/2022   Personal history of transient ischemic attack (TIA), and cerebral infarction without residual deficits 04/07/2022   Renal osteodystrophy 04/07/2022   Venous stasis ulcer of right calf 03/31/2022   Fistula, colovaginal 03/26/2022   Diarrhea 03/26/2022   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   Sepsis without acute organ dysfunction    Bacteremia    Acute pancreatitis 02/01/2022   Abdominal pain 02/01/2022   SIRS (systemic inflammatory response syndrome) 02/01/2022   Transaminitis 02/01/2022   History of anemia due to chronic kidney disease 02/01/2022   Paroxysmal atrial fibrillation 02/01/2022   DKA (diabetic ketoacidosis) 01/14/2022   NSTEMI (non-ST elevated myocardial infarction) 03/05/2021   Acute renal failure superimposed on stage 4 chronic kidney disease 08/22/2020   Hypoalbuminemia 05/25/2020   GERD (gastroesophageal reflux disease) 05/25/2020   Pressure injury of skin 05/17/2020   Type 2 diabetes mellitus with diabetic polyneuropathy, with long-term current use of insulin 03/07/2020   Obesity, Class III, BMI 40-49.9 (morbid obesity) 03/07/2020   Common bile duct (CBD) obstruction 05/28/2019   Benign neoplasm of ascending colon    Benign neoplasm of transverse colon    Benign neoplasm of descending colon    Benign neoplasm of sigmoid colon    Gastric polyps    Hyperkalemia 03/11/2019   Prolonged QT interval 03/11/2019    Acute blood loss anemia 03/11/2019   Onychomycosis 06/21/2018   Osteomyelitis of second toe of right foot    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    PVD (peripheral vascular disease) 10/26/2017   E-coli UTI 07/27/2017   Hypothyroidism 07/27/2017   AKI (acute kidney injury)    PAH (pulmonary artery hypertension)    Impaired ambulation 07/19/2017   Nausea & vomiting 07/15/2017   Leg cramps 02/27/2017   Peripheral edema 01/12/2017   Diabetic neuropathy 11/12/2016   CKD (chronic kidney disease), stage IV 10/24/2015   Anemia of chronic disease 10/03/2015   Generalized anxiety disorder 10/03/2015   Insomnia 10/03/2015   Hyperglycemia due to diabetes mellitus 06/07/2015   Chronic diastolic CHF (congestive heart failure) 06/07/2015   Non compliance with medical treatment 04/17/2014   Rotator cuff tear 03/14/2014   Class 3 obesity 09/23/2013   Chronic HFrEF (heart failure with reduced ejection fraction) 06/03/2013   Hypotension 12/25/2012   Urinary incontinence    MDD (major depressive disorder) 11/12/2010   RBBB (right bundle branch block)    Coronary artery disease    Hyperlipemia 01/22/2009   Essential hypertension 01/22/2009   PCP:  Donita Brooks, MD Pharmacy:   [id number] [place], [place] - [address] [place] [address] Phone: [phone] Fax: [phone]     Social Determinants of Health (SDOH) Social History: SDOH Screenings   Food Insecurity: No Food  Insecurity (08/27/2022)  Housing: Low Risk  (08/27/2022)  Transportation Needs: No Transportation Needs (08/27/2022)  Utilities: Not At Risk (08/27/2022)  Alcohol Screen: Low Risk  (08/16/2021)  Depression (PHQ2-9): Low Risk  (04/23/2022)  Financial Resource Strain: Low Risk  (08/16/2021)  Physical Activity: Inactive (08/16/2021)  Social Connections: Moderately Isolated (08/16/2021)  Stress: No Stress Concern Present (08/16/2021)   Tobacco Use: Medium Risk (08/27/2022)   SDOH Interventions:     Readmission Risk Interventions    05/12/2022    5:04 PM 02/07/2022    2:57 PM 01/23/2022   12:38 PM  Readmission Risk Prevention Plan  Transportation Screening Complete Complete Complete  PCP or Specialist Appt within 3-5 Days   Complete  HRI or Home Care Consult   Complete  Social Work Consult for Recovery Care Planning/Counseling   Complete  Palliative Care Screening   Not Applicable  Medication Review Oceanographer)  Complete Complete  PCP or Specialist appointment within 3-5 days of discharge  Complete   HRI or Home Care Consult  Complete   SW Recovery Care/Counseling Consult Complete Complete   Palliative Care Screening  Not Applicable   Skilled Nursing Facility Complete Not Applicable

## 2022-08-29 NOTE — Consult Note (Signed)
Reason for Consult: Abdominal pain, nausea, vomiting, diarrhea, and abnormal CT scan Referring Physician: [facility]  [name] HPI: This is a 71 year old female with a PMH of rectal ulcer with severe bleeding 05/24/2022, CHF with an EF of 30%, CAD, ESRD, and multiple other medical problems admitted for complaints of nausea, vomiting, abdominal pain, and diarrhea.  She experienced an acute onset of her symptoms after eating leftover food from [place].  The onset of her symptoms was 30 minutes after ingestion.  The patient's son and husband ate the same leftovers without any problems.  Her symptoms were unremitting and severe enough for her to present to the ER.  Imaging showed that she had a proctitis with congestion in the mesentery, however, this is not necessarily a new finding.  The congestion was identified with her 06/05/2022 CT scan.  In January she had a large stool ball in the rectum and a bleeding stercoral ulcer occurred.  The bleeding was ultimately arrested by surgical intervention after she failed endoscopic intervention.  Since being in the hospital her diarrhea resolved.  Her C diff shows positive antigen, but the PCR was negative.  The GI pathogen panel was also negative for any infectious issues.  Past Medical History:  Diagnosis Date   Acute MI 1999; 2007; 03/05/21   Anemia    hx   Anginal pain    Anxiety    ARF (acute renal failure) 06/2017   [facility]   Arthritis    "generalized" (03/15/2014)   CAD (coronary artery disease)    MI in 2000 - MI  2007 - treated bare metal stent (no nuclear since then as 9/11)   Carotid artery disease    CHF (congestive heart failure)    HFrEF 03/06/21   Chronic diastolic heart failure    a) ECHO (08/2013) EF 55-60% and RV function nl b) RHC (08/2013) RA 4, RV 30/5/7, PA 25/10 (16), PCWP 7, Fick CO/CI 6.3/2.7, PVR 1.5 WU, PA 61 and 66%   Daily headache    "~ every other day; since I fell in June" (03/15/2014)   Depression     Dyslipidemia    Dyspnea    uses oxygen qhs via [place]   ESRD (end stage renal disease)    Dialysis on Tues Thurs Sat   Exertional shortness of breath    History of kidney stones    HTN (hypertension)    Hypothyroidism    Neuropathy    Obesity    Osteoarthritis    PAF (paroxysmal atrial fibrillation)    Peripheral neuropathy    bilateral feet/hands   PONV (postoperative nausea and vomiting)    RBBB (right bundle branch block)    Old   Stroke    mini strokes   Syncope    likely due to low blood sugar   Tachycardia    Sinus tachycardia   Type II diabetes mellitus    Type II, [name]. patient has omnipod insulin pump with Novolin R Insulin   Urinary incontinence    Venous insufficiency     Past Surgical History:  Procedure Laterality Date   ABDOMINAL HYSTERECTOMY  1980's   AMPUTATION Right 02/24/2018   Procedure: RIGHT FOOT GREAT TOE AND 2ND TOE AMPUTATION;  Surgeon: Nadara Mustard, MD;  Location: [facility] OR;  Service: Orthopedics;  Laterality: Right;   AMPUTATION Right 04/30/2018   Procedure: RIGHT TRANSMETATARSAL AMPUTATION;  Surgeon: Nadara Mustard, MD;  Location: [facility] OR;  Service:  Orthopedics;  Laterality: Right;   AMPUTATION Right 05/02/2022   Procedure: RIGHT BELOW KNEE AMPUTATION;  Surgeon: Nadara Mustard, MD;  Location: [facility] OR;  Service: Orthopedics;  Laterality: Right;   APPLICATION OF WOUND VAC Right 06/13/2022   Procedure: APPLICATION OF WOUND VAC;  Surgeon: Nadara Mustard, MD;  Location: [facility] OR;  Service: Orthopedics;  Laterality: Right;   AV FISTULA PLACEMENT Left 04/02/2022   Procedure: LEFT ARM ARTERIOVENOUS (AV) FISTULA CREATION;  Surgeon: Nada Libman, MD;  Location: [facility] OR;  Service: Vascular;  Laterality: Left;  PERIPHERAL NERVE BLOCK   BASCILIC VEIN TRANSPOSITION Left 07/31/2022   Procedure: LEFT ARM SECOND STAGE BASILIC VEIN TRANSPOSITION;  Surgeon: Nada Libman, MD;  Location: [facility] OR;  Service: Vascular;  Laterality: Left;   BIOPSY  05/27/2020    Procedure: BIOPSY;  Surgeon: Lanelle Bal, DO;  Location: [facility] ENDO SUITE;  Service: Endoscopy;;   CATARACT EXTRACTION, BILATERAL Bilateral ?2013   COLONOSCOPY W/ POLYPECTOMY     COLONOSCOPY WITH PROPOFOL N/A 03/13/2019   Procedure: COLONOSCOPY WITH PROPOFOL;  Surgeon: Beverley Fiedler, MD;  Location: [facility] ENDOSCOPY;  Service: Gastroenterology;  Laterality: N/A;   CORONARY ANGIOPLASTY WITH STENT PLACEMENT  1999; 2007   "1 + 1"   ERCP N/A 02/03/2022   Procedure: ENDOSCOPIC RETROGRADE CHOLANGIOPANCREATOGRAPHY (ERCP);  Surgeon: Jeani Hawking, MD;  Location: [facility] ENDOSCOPY;  Service: Gastroenterology;  Laterality: N/A;   ESOPHAGOGASTRODUODENOSCOPY (EGD) WITH PROPOFOL N/A 03/13/2019   Procedure: ESOPHAGOGASTRODUODENOSCOPY (EGD) WITH PROPOFOL;  Surgeon: Beverley Fiedler, MD;  Location: [facility] ENDOSCOPY;  Service: Gastroenterology;  Laterality: N/A;   ESOPHAGOGASTRODUODENOSCOPY (EGD) WITH PROPOFOL N/A 05/27/2020   Procedure: ESOPHAGOGASTRODUODENOSCOPY (EGD) WITH PROPOFOL;  Surgeon: Lanelle Bal, DO;  [facility];  Service: Endoscopy;  Laterality: N/A;   EYE SURGERY Bilateral    lazer   FLEXIBLE SIGMOIDOSCOPY N/A 05/23/2022   Procedure: FLEXIBLE SIGMOIDOSCOPY;  Surgeon: Jeani Hawking, MD;  Location: [facility] ENDOSCOPY;  Service: Gastroenterology;  Laterality: N/A;   FLEXIBLE SIGMOIDOSCOPY N/A 05/24/2022   Procedure: FLEXIBLE SIGMOIDOSCOPY;  Surgeon: Imogene Burn, MD;  Location: [facility] ENDOSCOPY;  Service: Gastroenterology;  Laterality: N/A;   HEMOSTASIS CLIP PLACEMENT  03/13/2019   Procedure: HEMOSTASIS CLIP PLACEMENT;  Surgeon: Beverley Fiedler, MD;  Location: [facility] ENDOSCOPY;  Service: Gastroenterology;;   HEMOSTASIS CLIP PLACEMENT  05/23/2022   Procedure: HEMOSTASIS CLIP PLACEMENT;  Surgeon: Jeani Hawking, MD;  Location: [facility] ENDOSCOPY;  Service: Gastroenterology;;   HEMOSTASIS CONTROL  05/24/2022   Procedure: HEMOSTASIS CONTROL;  Surgeon: Imogene Burn, MD;  Location: [facility] ENDOSCOPY;  Service:  Gastroenterology;;   HOT HEMOSTASIS N/A 05/23/2022   Procedure: HOT HEMOSTASIS (ARGON PLASMA COAGULATION/BICAP);  Surgeon: Jeani Hawking, MD;  Location: [facility] ENDOSCOPY;  Service: Gastroenterology;  Laterality: N/A;   I & D EXTREMITY Left 05/05/2022   Procedure: IRRIGATION AND DEBRIDEMENT LEFT ARM AV FISTULA;  Surgeon: Cephus Shelling, MD;  Location[facility] OR;  Service: Vascular;  Laterality: Left;   INSERTION OF DIALYSIS CATHETER Right 04/02/2022   Procedure: INSERTION OF TUNNELED DIALYSIS CATHETER;  Surgeon: Nada Libman, MD;  Location: [facility] OR;  Service: Vascular;  Laterality: Right;   KNEE ARTHROSCOPY Left 10/25/2006   POLYPECTOMY  03/13/2019   Procedure: POLYPECTOMY;  Surgeon: Beverley Fiedler, MD;  Location: [facility] ENDOSCOPY;  Service: Gastroenterology;;   REMOVAL OF STONES  02/03/2022   Procedure: REMOVAL OF STONES;  Surgeon: Jeani Hawking, MD;  Location: [facility] ENDOSCOPY;  Service: Gastroenterology;;   REVISON OF ARTERIOVENOUS FISTULA Left 08/20/2022  Procedure: REVISON OF LEFT ARM ARTERIOVENOUS FISTULA;  Surgeon: Nada Libman, MD;  [facility] OR;  Service: Vascular;  Laterality: Left;   RIGHT HEART CATH N/A 07/24/2017   Procedure: RIGHT HEART CATH;  Surgeon: Dolores Patty, MD;  [facility] INVASIVE CV LAB;  Service: Cardiovascular;  Laterality: N/A;   RIGHT HEART CATHETERIZATION N/A 09/22/2013   Procedure: RIGHT HEART CATH;  Surgeon: Dolores Patty, MD;  Location: [facility] CATH LAB;  Service: Cardiovascular;  Laterality: N/A;   SHOULDER ARTHROSCOPY WITH OPEN ROTATOR CUFF REPAIR Right 03/14/2014   Procedure: RIGHT SHOULDER ARTHROSCOPY WITH BICEPS RELEASE, OPEN SUBSCAPULA REPAIR, OPEN SUPRASPINATUS REPAIR.;  Surgeon: Cammy Copa, MD;  Location: [facility] OR;  Service: Orthopedics;  Laterality: Right;   SPHINCTEROTOMY  02/03/2022   Procedure: SPHINCTEROTOMY;  Surgeon: Jeani Hawking, MD;  Location: [facility] ENDOSCOPY;  Service: Gastroenterology;;   STUMP REVISION Right 06/13/2022   Procedure:  REVISION RIGHT BELOW KNEE AMPUTATION;  Surgeon: Nadara Mustard, MD;  Location: [facility] OR;  Service: Orthopedics;  Laterality: Right;   TEE WITHOUT CARDIOVERSION N/A 02/04/2022   Procedure: TRANSESOPHAGEAL ECHOCARDIOGRAM (TEE);  Surgeon: Dolores Patty, MD;  Location: [facility] ENDOSCOPY;  Service: Cardiovascular;  Laterality: N/A;   THROMBECTOMY W/ EMBOLECTOMY Left 08/20/2022   Procedure: THROMBECTOMY OF LEFT ARM ARTERIOVENOUS FISTULA;  Surgeon: Nada Libman, MD;  Location[facility] OR;  Service: Vascular;  Laterality: Left;   TOE AMPUTATION Right 02/24/2018   GREAT TOE AND 2ND TOE AMPUTATION   TUBAL LIGATION  1970's    Family History  Problem Relation Age of Onset   Heart attack Mother 16    Social History:  reports that she quit smoking about 24 years ago. Her smoking use included cigarettes. She has a 96.00 pack-year smoking history. She has never used smokeless tobacco. She reports that she does not currently use alcohol. She reports that she does not use drugs.  Allergies:  Allergies  Allergen Reactions   Cephalexin Diarrhea and Other (See Comments)   Codeine Nausea And Vomiting and Other (See Comments)    Medications: Scheduled:  Chlorhexidine Gluconate Cloth  6 each Topical Q0600   heparin injection (subcutaneous)  5,000 Units Subcutaneous Q8H   insulin aspart  0-6 Units Subcutaneous Q4H   insulin glargine-yfgn  20 Units Subcutaneous Q supper   levothyroxine  75 mcg Oral QAC breakfast   midodrine  5 mg Oral 3 times per day on Mon Wed Fri   pantoprazole  40 mg Oral Daily   pregabalin  75 mg Oral Daily   sevelamer carbonate  1,600 mg Oral TID WC   sodium chloride flush  3 mL Intravenous Q12H   vortioxetine HBr  20 mg Oral Daily   Continuous:  cefTRIAXone (ROCEPHIN)  IV 2 g (08/28/22 2210)   metronidazole 500 mg (08/29/22 5859)    Results for orders placed or performed during the hospital encounter of 08/26/22 (from the past 24 hour(s))  Glucose, capillary     Status: Abnormal    Collection Time: 08/28/22  4:48 PM  Result Value Ref Range   Glucose-Capillary 200 (H) 70 - 99 mg/dL  Glucose, capillary     Status: Abnormal   Collection Time: 08/28/22  9:11 PM  Result Value Ref Range   Glucose-Capillary 222 (H) 70 - 99 mg/dL  Urinalysis, w/ Reflex to Culture (Infection Suspected) -Urine, Clean Catch     Status: Abnormal   Collection Time: 08/29/22  5:28 AM  Result Value Ref Range   Specimen Source URINE,  CLEAN CATCH    Color, Urine AMBER (A) YELLOW   APPearance HAZY (A) CLEAR   Specific Gravity, Urine 1.024 1.005 - 1.030   pH 5.0 5.0 - 8.0   Glucose, UA 150 (A) NEGATIVE mg/dL   Hgb urine dipstick SMALL (A) NEGATIVE   Bilirubin Urine SMALL (A) NEGATIVE   Ketones, ur 5 (A) NEGATIVE mg/dL   Protein, ur 409100 (A) NEGATIVE mg/dL   Nitrite NEGATIVE NEGATIVE   Leukocytes,Ua MODERATE (A) NEGATIVE   RBC / HPF 0-5 0 - 5 RBC/hpf   WBC, UA 21-50 0 - 5 WBC/hpf   Bacteria, UA FEW (A) NONE SEEN   Squamous Epithelial / HPF 0-5 0 - 5 /HPF   Mucus PRESENT    Budding Yeast PRESENT    Hyaline Casts, UA PRESENT   Glucose, capillary     Status: Abnormal   Collection Time: 08/29/22  7:32 AM  Result Value Ref Range   Glucose-Capillary 198 (H) 70 - 99 mg/dL  Comprehensive metabolic panel     Status: Abnormal   Collection Time: 08/29/22  9:00 AM  Result Value Ref Range   Sodium 134 (L) 135 - 145 mmol/L   Potassium 3.0 (L) 3.5 - 5.1 mmol/L   Chloride 93 (L) 98 - 111 mmol/L   CO2 23 22 - 32 mmol/L   Glucose, Bld 220 (H) 70 - 99 mg/dL   BUN 27 (H) 8 - 23 mg/dL   Creatinine, Ser 8.113.69 (H) 0.44 - 1.00 mg/dL   Calcium 8.7 (L) 8.9 - 10.3 mg/dL   Total Protein 5.9 (L) 6.5 - 8.1 g/dL   Albumin 2.6 (L) 3.5 - 5.0 g/dL   AST 14 (L) 15 - 41 U/L   ALT 14 0 - 44 U/L   Alkaline Phosphatase 128 (H) 38 - 126 U/L   Total Bilirubin 0.9 0.3 - 1.2 mg/dL   GFR, Estimated 12 (L) >60 mL/min   Anion gap 18 (H) 5 - 15  CBC     Status: Abnormal   Collection Time: 08/29/22  9:00 AM  Result Value Ref  Range   WBC 5.6 4.0 - 10.5 K/uL   RBC 4.08 3.87 - 5.11 MIL/uL   Hemoglobin 9.4 (L) 12.0 - 15.0 g/dL   HCT 91.433.0 (L) 78.236.0 - 95.646.0 %   MCV 80.9 80.0 - 100.0 fL   MCH 23.0 (L) 26.0 - 34.0 pg   MCHC 28.5 (L) 30.0 - 36.0 g/dL   RDW 21.320.3 (H) 08.611.5 - 57.815.5 %   Platelets 181 150 - 400 K/uL   nRBC 0.0 0.0 - 0.2 %   *Note: Due to a large number of results and/or encounters for the requested time period, some results have not been displayed. A complete set of results can be found in Results Review.     NM Hepatobiliary Liver Func  Result Date: 08/28/2022 CLINICAL DATA:  Right upper quadrant abdominal pain. EXAM: NUCLEAR MEDICINE HEPATOBILIARY IMAGING TECHNIQUE: Sequential images of the abdomen were obtained out to 60 minutes following intravenous administration of radiopharmaceutical. RADIOPHARMACEUTICALS:  5.2 mCi Tc-2851m  Choletec IV COMPARISON:  CT scan and ultrasound examinations 08/27/2022 FINDINGS: Symmetric uptake in the liver and fairly prompt excretion into the biliary tree. Activity is seen in the small bowel by 30 minutes. The gallbladder is visualized at 25 minutes. IMPRESSION: No findings for cystic or common bile duct obstruction. Electronically Signed   By: Rudie MeyerP.  Gallerani M.D.   On: 08/28/2022 16:12    ROS:  As stated above in  the HPI otherwise negative.  Blood pressure (!) 143/74, pulse (!) 107, temperature 98.2 F (36.8 C), resp. rate 12, height 5\' 6"  (1.676 m), weight 89.1 kg, SpO2 100 %.    PE: Gen: NAD, Alert and Oriented HEENT:  [place]/AT, EOMI Neck: Supple, no LAD Lungs: CTA Bilaterally CV: RRR without M/G/R ABD: Soft, diffusely tender, but mostly in the right side and upper abdomen, +BS Ext: No C/C/E  Assessment/Plan: 1) Nausea/vomiting. 2) Diarrhea. 3) Abdominal pain. 4) CHF.   Clinically the patient is stable.  There is no obvious infectious issue.  The findings of the proctitis and the mesenteric congestion are stable findings, essentially.  Her diarrhea resolved.  There is  evidence of cholelithiasis, but no elevations in the liver enzymes to suggested an obstruction. The mild increase in her AP is felt to be from her CHF.  Imaging does not show any dilated ducts.  She did feel better with phenergan.  Plan: 1) Continue with phenergan. 2) Okay with ceftriaxone for now. 3) Continue with supportive care with IV hydration. 4) Dr. Leonides Schanz with [facility] will cover this weekend. Donivan Thammavong D 08/29/2022, 1:51 PM

## 2022-08-29 NOTE — Progress Notes (Signed)
As noted in [place], RN's note, I wasted two doses with him but we recorded three wastes because it was assumed he clicked a prior shift's dose to waste.  After contacting pharmacy they said we could not un-document the assumed night shift dose waste and to put in a note with both of our name's and the nurse responsible for the patient would send an email.

## 2022-08-29 NOTE — Progress Notes (Signed)
[facility] Progress Note   Subjective: Seen in HD unit. Doesn't feel good. Continue nausea/abdominal cramping.   Objective Vitals:   08/28/22 2113 08/29/22 0506 08/29/22 0900 08/29/22 0930  BP: (!) 160/56 (!) 149/74 (!) 124/56 (!) 144/67  Pulse: 68 (!) 102 (!) 106 (!) 105  Resp: 20 18 18 14   Temp: 97.6 F (36.4 C) 97.9 F (36.6 C)    TempSrc: Oral Oral    SpO2: 100% 99% 100%   Weight:  90.3 kg 90.3 kg   Height:        90.3 kg  Additional Objective Labs: Basic Metabolic Panel: Recent Labs  Lab 08/26/22 2247 08/27/22 1053 08/27/22 1313 08/28/22 0253  NA 135  --  131* 135  K 2.8*  --  2.9* 3.6  CL 95*  --  93* 95*  CO2 22  --  25 23  GLUCOSE 208*  --  302* 216*  BUN 30*  --  32* 18  CREATININE 3.44* 3.64* 3.52* 2.54*  CALCIUM 9.1  --  8.8* 8.8*  PHOS  --   --  5.5*  --    CBC: Recent Labs  Lab 08/26/22 2247 08/27/22 1053 08/27/22 1313 08/28/22 0253  WBC 9.2 7.7 7.5 8.2  NEUTROABS 8.1*  --   --   --   HGB 10.3* 10.6* 9.6* 10.1*  HCT 35.3* 36.4 32.8* 34.8*  MCV 79.1* 80.4 79.4* 78.9*  PLT 100* 200 223 188   Blood Culture    Component Value Date/Time   SDES BLOOD RIGHT ARM 06/05/2022 0921   SPECREQUEST  06/05/2022 0921    BOTTLES DRAWN AEROBIC AND ANAEROBIC Blood Culture results may not be optimal due to an inadequate volume of blood received in culture bottles   CULT  06/05/2022 0921    NO GROWTH 5 DAYS Performed at [facility], [address], [place], [place] [address]    REPTSTATUS 06/10/2022 FINAL 06/05/2022 4709     Physical Exam General: Chronically ill appearing  Heart: Tachy, regular Lungs: Clear bilaterally Abdomen: soft non-tender Extremities: Trace LE edema  Dialysis Access: R IJ TDC   Medications:  cefTRIAXone (ROCEPHIN)  IV 2 g (08/28/22 2210)   metronidazole 500 mg (08/29/22 6283)    Chlorhexidine Gluconate Cloth  6 each Topical Q0600   heparin injection (subcutaneous)  5,000 Units Subcutaneous Q8H    heparin sodium (porcine)       insulin aspart  0-6 Units Subcutaneous Q4H   insulin glargine-yfgn  20 Units Subcutaneous Q supper   levothyroxine  75 mcg Oral QAC breakfast   midodrine  5 mg Oral 3 times per day on Mon Wed Fri   pantoprazole  40 mg Oral Daily   pregabalin  75 mg Oral Daily   sevelamer carbonate  1,600 mg Oral TID WC   sodium chloride flush  3 mL Intravenous Q12H   vortioxetine HBr  20 mg Oral Daily    Dialysis Orders:  MWF SW  4h  400/800  93kg  2/2 bath  RIJ TDC    Heparin 4000 + - last HD 4/1, post wt 93kg - venofer 50mg  IV weekly - mircera 225 mcg IV q 2 wks, last 3/26, due 4/09  Assessment/Plan: Abdominal pain / vomiting/ diarrhea - CT with proctitis. HIDA scan negative. On Abx for proctitis. GI consulted.  ESRD - on HD MWF. HD today.  HTN/ volume - BP controlled. sig LE edema which is moderate. Below EDW by weights here. UF as  able.  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 Follow.  MBD ckd - CCa and phos are in range. Cont binder (renvela) ac tid. Not getting vdra's.  PAF - per pmd PAD - sp R BKA DM2 - on insulin  Tomasa Blase PA-C [facility] 08/29/2022,10:05 AM

## 2022-08-30 ENCOUNTER — Inpatient Hospital Stay (HOSPITAL_COMMUNITY): Payer: PPO

## 2022-08-30 DIAGNOSIS — R1013 Epigastric pain: Secondary | ICD-10-CM | POA: Diagnosis not present

## 2022-08-30 DIAGNOSIS — R112 Nausea with vomiting, unspecified: Secondary | ICD-10-CM | POA: Diagnosis not present

## 2022-08-30 DIAGNOSIS — R008 Other abnormalities of heart beat: Secondary | ICD-10-CM

## 2022-08-30 LAB — PHOSPHORUS: Phosphorus: 4.1 mg/dL (ref 2.5–4.6)

## 2022-08-30 LAB — CBC WITH DIFFERENTIAL/PLATELET
Abs Immature Granulocytes: 0.02 10*3/uL (ref 0.00–0.07)
Basophils Absolute: 0.1 10*3/uL (ref 0.0–0.1)
Basophils Relative: 1 %
Eosinophils Absolute: 0.2 10*3/uL (ref 0.0–0.5)
Eosinophils Relative: 4 %
HCT: 32.1 % — ABNORMAL LOW (ref 36.0–46.0)
Hemoglobin: 9.4 g/dL — ABNORMAL LOW (ref 12.0–15.0)
Immature Granulocytes: 0 %
Lymphocytes Relative: 13 %
Lymphs Abs: 0.7 10*3/uL (ref 0.7–4.0)
MCH: 23.3 pg — ABNORMAL LOW (ref 26.0–34.0)
MCHC: 29.3 g/dL — ABNORMAL LOW (ref 30.0–36.0)
MCV: 79.5 fL — ABNORMAL LOW (ref 80.0–100.0)
Monocytes Absolute: 0.6 10*3/uL (ref 0.1–1.0)
Monocytes Relative: 11 %
Neutro Abs: 3.5 10*3/uL (ref 1.7–7.7)
Neutrophils Relative %: 71 %
Platelets: 168 10*3/uL (ref 150–400)
RBC: 4.04 MIL/uL (ref 3.87–5.11)
RDW: 20.3 % — ABNORMAL HIGH (ref 11.5–15.5)
WBC: 5 10*3/uL (ref 4.0–10.5)
nRBC: 0 % (ref 0.0–0.2)

## 2022-08-30 LAB — GLUCOSE, CAPILLARY
Glucose-Capillary: 90 mg/dL (ref 70–99)
Glucose-Capillary: 56 mg/dL — ABNORMAL LOW (ref 70–99)
Glucose-Capillary: 94 mg/dL (ref 70–99)
Glucose-Capillary: 129 mg/dL — ABNORMAL HIGH (ref 70–99)
Glucose-Capillary: 130 mg/dL — ABNORMAL HIGH (ref 70–99)
Glucose-Capillary: 170 mg/dL — ABNORMAL HIGH (ref 70–99)

## 2022-08-30 LAB — COMPREHENSIVE METABOLIC PANEL
ALT: 13 U/L (ref 0–44)
AST: 15 U/L (ref 15–41)
Albumin: 2.4 g/dL — ABNORMAL LOW (ref 3.5–5.0)
Alkaline Phosphatase: 112 U/L (ref 38–126)
Anion gap: 11 (ref 5–15)
BUN: 13 mg/dL (ref 8–23)
CO2: 24 mmol/L (ref 22–32)
Calcium: 8.1 mg/dL — ABNORMAL LOW (ref 8.9–10.3)
Chloride: 97 mmol/L — ABNORMAL LOW (ref 98–111)
Creatinine, Ser: 2.56 mg/dL — ABNORMAL HIGH (ref 0.44–1.00)
GFR, Estimated: 19 mL/min — ABNORMAL LOW (ref >60)
Glucose, Bld: 118 mg/dL — ABNORMAL HIGH (ref 70–99)
Potassium: 2.9 mmol/L — ABNORMAL LOW (ref 3.5–5.1)
Sodium: 132 mmol/L — ABNORMAL LOW (ref 135–145)
Total Bilirubin: 0.7 mg/dL (ref 0.3–1.2)
Total Protein: 5.6 g/dL — ABNORMAL LOW (ref 6.5–8.1)

## 2022-08-30 LAB — URINE CULTURE

## 2022-08-30 LAB — ECHOCARDIOGRAM COMPLETE
Area-P 1/2: 5.62 cm2
Height: 66 in
S' Lateral: 4.1 cm
Weight: 3142.88 oz

## 2022-08-30 LAB — MAGNESIUM: Magnesium: 1.7 mg/dL (ref 1.7–2.4)

## 2022-08-30 MED ORDER — MIDODRINE HCL 5 MG PO TABS
5.0000 mg | ORAL_TABLET | Freq: Three times a day (TID) | ORAL | Status: DC
  Administered 2022-08-30 – 2022-09-05 (×17): 5 mg via ORAL
  Filled 2022-08-30 (×17): qty 1

## 2022-08-30 MED ORDER — POTASSIUM CHLORIDE CRYS ER 20 MEQ PO TBCR
20.0000 meq | EXTENDED_RELEASE_TABLET | Freq: Once | ORAL | Status: AC
  Administered 2022-08-30: 20 meq via ORAL
  Filled 2022-08-30: qty 1

## 2022-08-30 MED ORDER — PERFLUTREN LIPID MICROSPHERE
1.0000 mL | INTRAVENOUS | Status: AC | PRN
  Administered 2022-08-30: 3 mL via INTRAVENOUS

## 2022-08-30 MED ORDER — INSULIN GLARGINE-YFGN 100 UNIT/ML ~~LOC~~ SOLN
10.0000 [IU] | Freq: Every day | SUBCUTANEOUS | Status: DC
  Administered 2022-08-30: 10 [IU] via SUBCUTANEOUS
  Filled 2022-08-30 (×3): qty 0.1

## 2022-08-30 MED ORDER — HEPARIN (PORCINE) 25000 UT/250ML-% IV SOLN
1850.0000 [IU]/h | INTRAVENOUS | Status: DC
  Administered 2022-08-30: 1100 [IU]/h via INTRAVENOUS
  Administered 2022-09-02 (×2): 1650 [IU]/h via INTRAVENOUS
  Filled 2022-08-30 (×7): qty 250

## 2022-08-30 MED ORDER — IOHEXOL 350 MG/ML SOLN
75.0000 mL | Freq: Once | INTRAVENOUS | Status: AC | PRN
  Administered 2022-08-30: 75 mL via INTRAVENOUS

## 2022-08-30 MED ORDER — IOHEXOL 9 MG/ML PO SOLN
500.0000 mL | ORAL | Status: AC
  Administered 2022-08-30 (×2): 500 mL via ORAL

## 2022-08-30 NOTE — Progress Notes (Signed)
Gastroenterology Inpatient Follow Up    Subjective: Patient was eating [place] ice this morning.  She does say that her nausea and vomiting has improved.  She thinks that her diarrhea has improved as well.  Still having some intermittent right-sided abdominal pain.  Objective: Vital signs in last 24 hours: Temp:  [97.5 F (36.4 C)-98.2 F (36.8 C)] 97.5 F (36.4 C) (04/06 0839) Pulse Rate:  [86-105] 101 (04/06 0839) Resp:  [14-18] 15 (04/06 0839) BP: (114-153)/(61-74) 114/61 (04/06 0839) SpO2:  [99 %-100 %] 100 % (04/06 0839) Last BM Date : 08/28/22  Intake/Output from previous day: 04/05 0701 - 04/06 0700 In: 380 [P.O.:30; IV Piggyback:350] Out: 2000  Intake/Output this shift: Total I/O In: 243 [P.O.:240; I.V.:3] Out: -   General appearance: alert and cooperative Resp: no increased WOB Cardio: regular rate GI: mildly tender in the right side of the abdomen  Lab Results: Recent Labs    08/28/22 0253 08/29/22 0900 08/30/22 0158  WBC 8.2 5.6 5.0  HGB 10.1* 9.4* 9.4*  HCT 34.8* 33.0* 32.1*  PLT 188 181 168   BMET Recent Labs    08/28/22 0253 08/29/22 0900 08/30/22 0158  NA 135 134* 132*  K 3.6 3.0* 2.9*  CL 95* 93* 97*  CO2 23 23 24   GLUCOSE 216* 220* 118*  BUN 18 27* 13  CREATININE 2.54* 3.69* 2.56*  CALCIUM 8.8* 8.7* 8.1*   LFT Recent Labs    08/28/22 0253 08/29/22 0900 08/30/22 0158  PROT 6.3*   < > 5.6*  ALBUMIN 2.7*   < > 2.4*  AST 17   < > 15  ALT 15   < > 13  ALKPHOS 144*   < > 112  BILITOT 0.8   < > 0.7  BILIDIR 0.1  --   --   IBILI 0.7  --   --    < > = values in this interval not displayed.   PT/INR No results for input(s): "LABPROT", "INR" in the last 72 hours. Hepatitis Panel No results for input(s): "HEPBSAG", "HCVAB", "HEPAIGM", "HEPBIGM" in the last 72 hours. C-Diff No results for input(s): "CDIFFTOX" in the last 72 hours.  Studies/Results: ECHOCARDIOGRAM COMPLETE  Result Date: 08/30/2022    ECHOCARDIOGRAM REPORT    Patient Name:   RILEI LIGHTCAP Date of Exam: 08/30/2022 Medical Rec #:  [id number]    Height:       66.0 in Accession #:    [id number]   Weight:       196.4 lb Date of Birth:  12/17/1950    BSA:          1.985 m Patient Age:    72 years     BP:           114/61 mmHg Patient Gender: F            HR:           103 bpm. Exam Location:  Inpatient Procedure: 2D Echo, Cardiac Doppler, Color Doppler and Intracardiac            Opacification Agent Indications:    Other abnormalities of the heart R00.8  History:        Patient has prior history of Echocardiogram examinations, most                 recent 02/02/2022. CHF, CAD and Previous Myocardial Infarction,                 Stroke, Arrythmias:Tachycardia and  RBBB, Signs/Symptoms:Syncope                 and Shortness of Breath; Risk Factors:Former Smoker,                 Dyslipidemia and Hypertension.  Sonographer:    Aron Baba Referring Phys: [phone] A CALDWELL POWELL JR  Sonographer Comments: Suboptimal apical window. Image acquisition challenging due to patient body habitus and Image acquisition challenging due to respiratory motion. IMPRESSIONS  1. Global hypokinesis with apical akinesis; overall moderate to severe LV dysfunction.  2. Left ventricular ejection fraction, by estimation, is 30 to 35%. The left ventricle has moderate to severely decreased function. The left ventricle demonstrates regional wall motion abnormalities (see scoring diagram/findings for description). There is mild left ventricular hypertrophy. Left ventricular diastolic parameters are indeterminate. Elevated left atrial pressure.  3. Right ventricular systolic function is mildly reduced. The right ventricular size is moderately enlarged. There is normal pulmonary artery systolic pressure.  4. Left atrial size was moderately dilated.  5. Right atrial size was mildly dilated.  6. Moderate pleural effusion in the left lateral region.  7. The mitral valve is normal in structure. No evidence of mitral  valve regurgitation. No evidence of mitral stenosis.  8. Tricuspid valve regurgitation is moderate.  9. The aortic valve is normal in structure. Aortic valve regurgitation is not visualized. Aortic valve sclerosis/calcification is present, without any evidence of aortic stenosis. 10. The inferior vena cava is normal in size with greater than 50% respiratory variability, suggesting right atrial pressure of 3 mmHg. FINDINGS  Left Ventricle: Left ventricular ejection fraction, by estimation, is 30 to 35%. The left ventricle has moderate to severely decreased function. The left ventricle demonstrates regional wall motion abnormalities. Definity contrast agent was given IV to delineate the left ventricular endocardial borders. The left ventricular internal cavity size was normal in size. There is mild left ventricular hypertrophy. Left ventricular diastolic parameters are indeterminate. Elevated left atrial pressure. Right Ventricle: The right ventricular size is moderately enlarged. Right ventricular systolic function is mildly reduced. There is normal pulmonary artery systolic pressure. The tricuspid regurgitant velocity is 2.58 m/s, and with an assumed right atrial pressure of 8 mmHg, the estimated right ventricular systolic pressure is 34.6 mmHg. Left Atrium: Left atrial size was moderately dilated. Right Atrium: Right atrial size was mildly dilated. Pericardium: There is no evidence of pericardial effusion. Mitral Valve: The mitral valve is normal in structure. Mild mitral annular calcification. No evidence of mitral valve regurgitation. No evidence of mitral valve stenosis. Tricuspid Valve: The tricuspid valve is normal in structure. Tricuspid valve regurgitation is moderate . No evidence of tricuspid stenosis. Aortic Valve: The aortic valve is normal in structure. Aortic valve regurgitation is not visualized. Aortic valve sclerosis/calcification is present, without any evidence of aortic stenosis. Pulmonic Valve:  The pulmonic valve was normal in structure. Pulmonic valve regurgitation is not visualized. No evidence of pulmonic stenosis. Aorta: The aortic root is normal in size and structure. Venous: The inferior vena cava is normal in size with greater than 50% respiratory variability, suggesting right atrial pressure of 3 mmHg. IAS/Shunts: No atrial level shunt detected by color flow Doppler. Additional Comments: Global hypokinesis with apical akinesis; overall moderate to severe LV dysfunction. There is a moderate pleural effusion in the left lateral region.  LEFT VENTRICLE PLAX 2D LVIDd:         5.10 cm   Diastology LVIDs:         4.10 cm  LV e' medial:    2.68 cm/s LV PW:         1.30 cm   LV E/e' medial:  51.9 LV IVS:        1.00 cm   LV e' lateral:   10.80 cm/s LVOT diam:     1.70 cm   LV E/e' lateral: 12.9 LV SV:         39 LV SV Index:   19 LVOT Area:     2.27 cm  RIGHT VENTRICLE RV S prime:     2.92 cm/s TAPSE (M-mode): 1.0 cm LEFT ATRIUM             Index        RIGHT ATRIUM           Index LA diam:        3.60 cm 1.81 cm/m   RA Area:     22.70 cm LA Vol (A2C):   97.5 ml 49.13 ml/m  RA Volume:   66.70 ml  33.61 ml/m LA Vol (A4C):   69.3 ml 34.92 ml/m LA Biplane Vol: 83.3 ml 41.97 ml/m  AORTIC VALVE             PULMONIC VALVE LVOT Vmax:   105.00 cm/s PR End Diast Vel: 3.44 msec LVOT Vmean:  68.700 cm/s LVOT VTI:    0.170 m  AORTA Ao Root diam: 3.40 cm Ao Asc diam:  3.20 cm MITRAL VALVE                TRICUSPID VALVE MV Area (PHT): 5.62 cm     TR Peak grad:   26.6 mmHg MV Decel Time: 135 msec     TR Vmax:        258.00 cm/s MV E velocity: 139.00 cm/s MV A velocity: 36.90 cm/s   SHUNTS MV E/A ratio:  3.77         Systemic VTI:  0.17 m                             Systemic Diam: 1.70 cm Olga Millers MD Electronically signed by Olga Millers MD Signature Date/Time: 08/30/2022/2:36:28 PM    Final    NM Hepatobiliary Liver Func  Result Date: 08/28/2022 CLINICAL DATA:  Right upper quadrant abdominal pain. EXAM:  NUCLEAR MEDICINE HEPATOBILIARY IMAGING TECHNIQUE: Sequential images of the abdomen were obtained out to 60 minutes following intravenous administration of radiopharmaceutical. RADIOPHARMACEUTICALS:  5.2 mCi Tc-11m  Choletec IV COMPARISON:  CT scan and ultrasound examinations 08/27/2022 FINDINGS: Symmetric uptake in the liver and fairly prompt excretion into the biliary tree. Activity is seen in the small bowel by 30 minutes. The gallbladder is visualized at 25 minutes. IMPRESSION: No findings for cystic or common bile duct obstruction. Electronically Signed   By: Rudie Meyer M.D.   On: 08/28/2022 16:12    Medications: I have reviewed the patient's current medications. Scheduled:  Chlorhexidine Gluconate Cloth  6 each Topical Q0600   insulin aspart  0-6 Units Subcutaneous Q4H   insulin glargine-yfgn  10 Units Subcutaneous Q supper   levothyroxine  75 mcg Oral QAC breakfast   midodrine  5 mg Oral TID WC   pantoprazole  40 mg Oral Daily   pregabalin  75 mg Oral Daily   sevelamer carbonate  1,600 mg Oral TID WC   sodium chloride flush  3 mL Intravenous Q12H   vortioxetine HBr  20 mg Oral Daily  Continuous:  cefTRIAXone (ROCEPHIN)  IV 2 g (08/29/22 2100)   metronidazole 500 mg (08/30/22 1122)   promethazine (PHENERGAN) injection (IM or IVPB) 6.25 mg (08/30/22 1128)   ZOX:[id number]:acetaminophen **OR** acetaminophen, HYDROmorphone (DILAUDID) injection, LORazepam, LORazepam, perflutren lipid microspheres (DEFINITY) IV suspension, prochlorperazine, promethazine **OR** promethazine (PHENERGAN) injection (IM or IVPB) **OR** promethazine  Assessment/Plan: 72 year old female with history of rectal ulcer in 04/2022, HFrEF (EF 30-35%), ESRD on HD, CAD, DM, A-fib presented with nausea, vomiting, abdominal pain, and diarrhea.  She had had a negative infectious stool workup.  C dif antigen was positive but PCR and toxin were negative.  Right upper quadrant ultrasound showed a distended gallbladder with small stones  and trace pericholecystic fluid as well as CBD dilatation.  HIDA scan was normal.  LFTs are normal.  CT shows some signs of proctitis, which is favored to represent bowel wall edema related to her severe heart failure.  Patient's symptoms may be due to a gastroenteritis and/or UTI  She has had some improvement in her symptoms.  -Continue supportive care for now -Follow-up repeat CT A/P ordered by hospitalist team -Patient is being restarted on anticoagulation (held due to prior rectal ulcer bleed) due to finding of splenic infarct on recent CTA -Patient is on antibiotics for UTI   LOS: 3 days   Imogene BurnYing C Angelly Spearing 08/30/2022, 2:57 PM

## 2022-08-30 NOTE — Progress Notes (Signed)
[facility] Progress Note   Subjective: Seen in room. No new events. Continued nausea/abdominal pain. Has CT ordered for today.   Objective Vitals:   08/29/22 1655 08/29/22 2144 08/30/22 0501 08/30/22 0839  BP: (!) 153/69 (!) 146/70 125/74 114/61  Pulse: (!) 105 (!) 101 86 (!) 101  Resp: 18 18 14 15   Temp: 98.1 F (36.7 C) 98 F (36.7 C) 98.2 F (36.8 C) (!) 97.5 F (36.4 C)  TempSrc:  Oral Oral Oral  SpO2: 100% 100% 99% 100%  Weight:      Height:        89.1 kg  Additional Objective Labs: Basic Metabolic Panel: Recent Labs  Lab 08/27/22 1313 08/28/22 0253 08/29/22 0900 08/30/22 0158  NA 131* 135 134* 132*  K 2.9* 3.6 3.0* 2.9*  CL 93* 95* 93* 97*  CO2 25 23 23 24   GLUCOSE 302* 216* 220* 118*  BUN 32* 18 27* 13  CREATININE 3.52* 2.54* 3.69* 2.56*  CALCIUM 8.8* 8.8* 8.7* 8.1*  PHOS 5.5*  --   --  4.1    CBC: Recent Labs  Lab 08/26/22 2247 08/27/22 1053 08/27/22 1313 08/28/22 0253 08/29/22 0900 08/30/22 0158  WBC 9.2 7.7 7.5 8.2 5.6 5.0  NEUTROABS 8.1*  --   --   --   --  3.5  HGB 10.3* 10.6* 9.6* 10.1* 9.4* 9.4*  HCT 35.3* 36.4 32.8* 34.8* 33.0* 32.1*  MCV 79.1* 80.4 79.4* 78.9* 80.9 79.5*  PLT 100* 200 223 188 181 168    Blood Culture    Component Value Date/Time   SDES URINE, RANDOM 08/29/2022 0528   SPECREQUEST  08/29/2022 0528    NONE Reflexed from [id number] Performed at [facility], [address], [place], [place] [address]    CULT MULTIPLE SPECIES PRESENT, SUGGEST RECOLLECTION (A) 08/29/2022 0528   REPTSTATUS 08/30/2022 FINAL 08/29/2022 0528     Physical Exam General: Chronically ill appearing  Heart: Tachy, regular Lungs: Clear bilaterally Abdomen: soft non-tender Extremities: R BKA Trace LE edema  Dialysis Access: R IJ TDC   Medications:  cefTRIAXone (ROCEPHIN)  IV 2 g (08/29/22 2100)   metronidazole 500 mg (08/30/22 0430)   promethazine (PHENERGAN) injection (IM or IVPB) 6.25 mg (08/29/22 2239)     Chlorhexidine Gluconate Cloth  6 each Topical Q0600   insulin aspart  0-6 Units Subcutaneous Q4H   insulin glargine-yfgn  10 Units Subcutaneous Q supper   iohexol  500 mL Oral Q1H   levothyroxine  75 mcg Oral QAC breakfast   midodrine  5 mg Oral TID WC   pantoprazole  40 mg Oral Daily   pregabalin  75 mg Oral Daily   sevelamer carbonate  1,600 mg Oral TID WC   sodium chloride flush  3 mL Intravenous Q12H   vortioxetine HBr  20 mg Oral Daily    Dialysis Orders:  MWF SW  4h  400/800  93kg  2/2 bath  RIJ TDC    Heparin 4000 + - last HD 4/1, post wt 93kg - venofer 50mg  IV weekly - mircera 225 mcg IV q 2 wks, last 3/26, due 4/09  Assessment/Plan: Abdominal pain / vomiting/ diarrhea - CT with proctitis. HIDA scan negative. On Abx for proctitis. GI consulted -no further intervention planned. Per primary  ESRD - on HD MWF. HD today.  HTN/ volume - BP controlled. sig LE edema which is moderate. Below EDW by weights here. UF as able.  Anemia esrd -Hgb 10.1 , next  esa due on 4/09. Follow.  MBD ckd - CCa and phos are in range. Cont binder (renvela) ac tid. Not getting vdra's.  PAF - per pmd PAD - sp R BKA DM2 - on insulin  Tomasa Blase PA-C [facility] 08/30/2022,11:20 AM

## 2022-08-30 NOTE — Evaluation (Signed)
Occupational Therapy Evaluation Patient Details Name: Susan [name] MRN: [id number] DOB: 1950/06/08 Today's Date: 08/30/2022   History of Present Illness 72 y.o. female presenting [facility] ED 4/3 from SNF with severe abdominal pain, nausea, and vomiting. Imaging notable for proctitis with mesenteric congestive changes in the lower abdomen and pelvis; also notable RUQ TTP, RUQ US shows distended gall bladder with small stones and trace pericholecystic fluid. Recent thrombectomy of L upper arm arteriovenous fistula/revision of L arm arteriovenous fistula 3/27. PMH significant for MI, CHF, CAD, ESRD, HTN, PAF, DMII, R BKA. (Simultaneous filing. User may not have seen previous data.)   Clinical Impression   PTA, pt recently at SNF receiving assist with UB and LB ADL; pt reports hoyer lifts for transfer to dialysis chair. Upon eval, pt presenting with decreased strength, safety, activity tolerance, and pain. Pt with decresaed knowledge of residual limb healing and post-op instructions for preparation of the leg for potential prosthesis, but very interested in future prosthesis. Bed level eval due to pt reports she is in too much pain to sit EOB. Posterior pericare with total A but pt with good ability to roll. Requires assistance for rolling to R. Patient will benefit from continued inpatient follow up therapy, <3 hours/day      Recommendations for follow up therapy are one component of a multi-disciplinary discharge planning process, led by the attending physician.  Recommendations may be updated based on patient status, additional functional criteria and insurance authorization.   Assistance Recommended at Discharge Frequent or constant Supervision/Assistance  Patient can return home with the following Two people to help with walking and/or transfers;A lot of help with bathing/dressing/bathroom;Assistance with cooking/housework;Assist for transportation;Help with stairs or ramp for entrance    Functional  Status Assessment  Patient has had a recent decline in their functional status and demonstrates the ability to make significant improvements in function in a reasonable and predictable amount of time.  Equipment Recommendations  Other (comment) (defer)    Recommendations for Other Services       Precautions / Restrictions Precautions Precautions: Fall Restrictions Weight Bearing Restrictions: No      Mobility Bed Mobility Overal bed mobility: Needs Assistance Bed Mobility: Rolling Rolling: Min guard, Min assist, Mod assist         General bed mobility comments: Good tolerance of rolling toward L. Min-mod A to roll R    Transfers                   General transfer comment: deferred      Balance                                           ADL either performed or assessed with clinical judgement   ADL Overall ADL's : Needs assistance/impaired Eating/Feeding: Modified independent;Bed level   Grooming: Set up;Bed level   Upper Body Bathing: Moderate assistance;Bed level   Lower Body Bathing: Maximal assistance;Bed level   Upper Body Dressing : Moderate assistance;Bed level   Lower Body Dressing: Maximal assistance;Bed level     Toilet Transfer Details (indicate cue type and reason): Pt refusing EOB today Toileting- Clothing Manipulation and Hygiene: Total assistance;Bed level               Vision Ability to See in Adequate Light: 0 Adequate Patient Visual Report: No change from baseline Vision Assessment?: No apparent visual deficits  Perception     Praxis      Pertinent Vitals/Pain Pain Assessment Pain Assessment: Faces Faces Pain Scale: Hurts even more Pain Location: generalized; bottom Pain Descriptors / Indicators: Aching, Sore Pain Intervention(s): Limited activity within patient's tolerance, Monitored during session, Repositioned     Hand Dominance Right   Extremity/Trunk Assessment Upper Extremity  Assessment Upper Extremity Assessment: Generalized weakness   Lower Extremity Assessment Lower Extremity Assessment: Defer to PT evaluation       Communication Communication Communication: No difficulties   Cognition Arousal/Alertness: Awake/alert Behavior During Therapy: WFL for tasks assessed/performed Overall Cognitive Status: Impaired/Different from baseline Area of Impairment: Safety/judgement                         Safety/Judgement: Decreased awareness of safety     General Comments: Incresaed time for processing. Decreased knowledge of recovery of her mobility and     General Comments  VSS    Exercises     Shoulder Instructions      Home Living Family/patient expects to be discharged to:: Skilled nursing facility                                        Prior Functioning/Environment Prior Level of Function : Needs assist             Mobility Comments: requires lift to transfer OOB since BKA ADLs Comments: requires assist for ADLs bed level, pt reports self feeding, grooming        OT Problem List: Decreased strength;Decreased activity tolerance;Impaired balance (sitting and/or standing);Decreased range of motion;Decreased knowledge of use of DME or AE;Decreased knowledge of precautions;Pain      OT Treatment/Interventions: Self-care/ADL training;Therapeutic exercise;DME and/or AE instruction;Balance training;Patient/family education;Therapeutic activities;Cognitive remediation/compensation    OT Goals(Current goals can be found in the care plan section) Acute Rehab OT Goals Patient Stated Goal: Be able to get a prosthetic OT Goal Formulation: With patient Time For Goal Achievement: 09/13/22 Potential to Achieve Goals: Good  OT Frequency: Min 2X/week    Co-evaluation              AM-PAC OT "6 Clicks" Daily Activity     Outcome Measure Help from another person eating meals?: None Help from another person taking care  of personal grooming?: A Little Help from another person toileting, which includes using toliet, bedpan, or urinal?: Total Help from another person bathing (including washing, rinsing, drying)?: A Lot Help from another person to put on and taking off regular upper body clothing?: A Lot Help from another person to put on and taking off regular lower body clothing?: Total 6 Click Score: 13   End of Session Nurse Communication: Mobility status  Activity Tolerance: Patient tolerated treatment well Patient left: in bed;with call bell/phone within reach;with bed alarm set  OT Visit Diagnosis: Muscle weakness (generalized) (M62.81);Pain                Time: 0300-9233 OT Time Calculation (min): 28 min Charges:  OT General Charges $OT Visit: 1 Visit OT Evaluation $OT Eval Moderate Complexity: 1 Mod  Tyler Deis, OTR/L [facility] Acute Rehabilitation Office: [phone]   Myrla Halsted 08/30/2022, 5:30 PM

## 2022-08-30 NOTE — Progress Notes (Signed)
ANTICOAGULATION CONSULT NOTE - Initial Consult  Pharmacy Consult for Heparin infusion Indication: Afib with new splenic infarct, and potential renal infarct  Allergies  Allergen Reactions   Cephalexin Diarrhea and Other (See Comments)   Codeine Nausea And Vomiting and Other (See Comments)    Patient Measurements: Height: 5\' 6"  (167.6 cm) Weight: 89 kg (196 lb 3.4 oz) IBW/kg (Calculated) : 59.3 Heparin Dosing Weight: 81.8kg  Vital Signs: Temp: 97.8 F (36.6 C) (04/06 2056) Temp Source: Oral (04/06 2056) BP: 124/58 (04/06 2056) Pulse Rate: 97 (04/06 2056)  Labs: Recent Labs    08/28/22 0253 08/29/22 0900 08/30/22 0158  HGB 10.1* 9.4* 9.4*  HCT 34.8* 33.0* 32.1*  PLT 188 181 168  CREATININE 2.54* 3.69* 2.56*    Estimated Creatinine Clearance: 22 mL/min (A) (by C-G formula based on SCr of 2.56 mg/dL (H)).   Medical History: Past Medical History:  Diagnosis Date   Acute MI 1999; 2007; 03/05/21   Anemia    hx   Anginal pain    Anxiety    ARF (acute renal failure) 06/2017   [facility]   Arthritis    "generalized" (03/15/2014)   CAD (coronary artery disease)    MI in 2000 - MI  2007 - treated bare metal stent (no nuclear since then as 9/11)   Carotid artery disease    CHF (congestive heart failure)    HFrEF 03/06/21   Chronic diastolic heart failure    a) ECHO (08/2013) EF 55-60% and RV function nl b) RHC (08/2013) RA 4, RV 30/5/7, PA 25/10 (16), PCWP 7, Fick CO/CI 6.3/2.7, PVR 1.5 WU, PA 61 and 66%   Daily headache    "~ every other day; since I fell in June" (03/15/2014)   Depression    Dyslipidemia    Dyspnea    uses oxygen qhs via [place]   ESRD (end stage renal disease)    Dialysis on Tues Thurs Sat   Exertional shortness of breath    History of kidney stones    HTN (hypertension)    Hypothyroidism    Neuropathy    Obesity    Osteoarthritis    PAF (paroxysmal atrial fibrillation)    Peripheral neuropathy    bilateral feet/hands   PONV  (postoperative nausea and vomiting)    RBBB (right bundle branch block)    Old   Stroke    mini strokes   Syncope    likely due to low blood sugar   Tachycardia    Sinus tachycardia   Type II diabetes mellitus    Type II, [name]. patient has omnipod insulin pump with Novolin R Insulin   Urinary incontinence    Venous insufficiency    Assessment: Patient presenting with abdominal pain, nausea, and vomiting. Patient has Afib without current anticoagulation (on apixaban previously but held) due to rectal ulcers in 12/23. CT abdomen noted new splenic infarct as well as potential renal infarct. GI provider would like to [facility] to prevent further infarcts. Patients renal function variable but trending down Scr 3.69>>2.56 (CrCl 22). Hgb low at 9.4 for past 2 days. Will need close monitoring of labs and signs of bleeding due to patient's risk to bleed.  Goal of Therapy:  Heparin level 0.3-0.7 units/ml Monitor platelets by anticoagulation protocol: Yes   Plan:  Start heparin infusion at 1100units/hr based on Afib dosing No bolus due to patients risk of bleeding with Hx of rectal ulcers Check 8 hour heparin level Continue  to monitor for signs/symptoms of bleeding and renal funx  Loretta Plume 08/30/2022,11:19 PM

## 2022-08-30 NOTE — Progress Notes (Signed)
PROGRESS NOTE    Susan [name]  HOZ:[id number] DOB: January 17, 1951 DOA: 08/26/2022 PCP: Donita Brooks, MD  Chief Complaint  Patient presents with   Abdominal Pain    Brief Narrative:   72 yo F with ESRD, diastolic HF, peripheral neuropathy, HTN, hypothyroidism, and T2DM presenting with nausea, vomiting, and abdominal pain. These symptoms developed after eating mac and cheese and ham leftover from Easter. Imaging notable for proctitis with mesenteric congestive changes in the lower abdomen and pelvis. Also with notable RUQ TTP, RUQ US shows distended gallbladder with small stones and trace pericholecystic fluid. Currently on antibiotics to cover proctocolitis.   Assessment & Plan:   Principal Problem:   Abdominal pain  Abdominal pain  Intractable Nausea CT with proctitis with mesenteric congestive changes in lower abdomen and pelvis Continuing abx for now to cover proctitis - notably, hx of bleeding rectal ulcer, oversewn 12/230 by general surgery Flex sig 12/30 with multiple ulcers in rectum  RUQ TTP concerning for cholecystitis, will follow HIDA -> negative GI path panel negative.  C diff PCR negative, antigen positive  Clear liquids for now - still having issues tolerating PO  Will consult gastroenterology due to persistent symptoms which haven't significantly improved with antibiotics, appreciate their assistance - they suspect proctitis and mesenteric congestion are stable - ap related to volume? Antiemetics prn Low threshold for repeat CT scan, hasn't had significant improvement in her abdominal pain - will repeat today   Volume overload Body wall anasarca increased per imaging Generalized interstitial edema and L>R effusions Ground glass edema Volume per renal -> dialysis per renal    ESRD Per renal    Hypokalemia Replace cautiously, follow   Imaging concerning for cystitis Follow urine culture    Hypodense abnormality in superomedial aspect of spleen  Splenic  Infarct Evolving infarct vs burned out infectious process Multiple infarcts per my discussion with rads -> seems like her anticoagulation was stopped due to issues with rectal ulcers, will discuss with surgery whether her anticoagulation can be restarted   Hx Afib Hold amiodarone with qtc prolongation Resume eliquis today after discussion with GI/surgery yesterday Anticoagulation on hold due to hx rectal ulcer --- as above, will discuss whether this can be resumed (ok per my discussion with surgery on call and GI yesterday)   T2DM  Hypoglycemia Reduce insulin to half dose basal until more reliable PO intake SSI Holding tradjenta   Thrombocytopenia Resolved  Prolonged Qtc Repeat EKG -> improved Qtc 442 Caution with qt prolonging meds Hold amio for now   Hypertension On midodrine with dialysis Has prn hydral? Would stop this at d/c  Hypothyroidism Synthroid    DVT prophylaxis: heparin Code Status: full Family Communication: none Disposition:   Status is: Inpatient Remains inpatient appropriate because: need for inpatient care   Consultants:  GI  Procedures:  none  Antimicrobials:  Anti-infectives (From admission, onward)    Start     Dose/Rate Route Frequency Ordered Stop   08/28/22 0400  metroNIDAZOLE (FLAGYL) IVPB 500 mg        500 mg 100 mL/hr over 60 Minutes Intravenous Every 8 hours 08/27/22 2048     08/27/22 2200  vancomycin (VANCOCIN) 50 mg/mL oral solution SOLN 125 mg  Status:  Discontinued        125 mg Oral Daily at bedtime 08/27/22 1946 08/27/22 2042   08/27/22 2200  vancomycin (VANCOCIN) capsule 125 mg  Status:  Discontinued        125 mg Oral Daily at  bedtime 08/27/22 2042 08/27/22 2048   08/27/22 2045  cefTRIAXone (ROCEPHIN) 2 g in sodium chloride 0.9 % 100 mL IVPB        2 g 200 mL/hr over 30 Minutes Intravenous Every 24 hours 08/27/22 1949     08/27/22 1000  metroNIDAZOLE (FLAGYL) IVPB 500 mg  Status:  Discontinued        500 mg 100 mL/hr  over 60 Minutes Intravenous Every 12 hours 08/27/22 0848 08/27/22 2048       Subjective: Continued inability to tolerate PO   Objective: Vitals:   08/29/22 1655 08/29/22 2144 08/30/22 0501 08/30/22 0839  BP: (!) 153/69 (!) 146/70 125/74 114/61  Pulse: (!) 105 (!) 101 86 (!) 101  Resp: 18 18 14 15   Temp: 98.1 F (36.7 C) 98 F (36.7 C) 98.2 F (36.8 C) (!) 97.5 F (36.4 C)  TempSrc:  Oral Oral Oral  SpO2: 100% 100% 99% 100%  Weight:      Height:        Intake/Output Summary (Last 24 hours) at 08/30/2022 0905 Last data filed at 08/30/2022 [id number] Gross per 24 hour  Intake 380 ml  Output 2000 ml  Net -1620 ml   Filed Weights   08/29/22 0845 08/29/22 0900 08/29/22 1330  Weight: 90.3 kg 90.3 kg 89.1 kg    Examination:  General: No acute distress. Cardiovascular: RRR Lungs: unlabored Abdomen: diffuse abdominal TTP, seems improved from first few days, but still significant Neurological: Alert and oriented 3. Moves all extremities 4 with equal strength. Cranial nerves II through XII grossly intact. Extremities: R BKA     Data Reviewed: I have personally reviewed following labs and imaging studies  CBC: Recent Labs  Lab 08/26/22 2247 08/27/22 1053 08/27/22 1313 08/28/22 0253 08/29/22 0900 08/30/22 0158  WBC 9.2 7.7 7.5 8.2 5.6 5.0  NEUTROABS 8.1*  --   --   --   --  3.5  HGB 10.3* 10.6* 9.6* 10.1* 9.4* 9.4*  HCT 35.3* 36.4 32.8* 34.8* 33.0* 32.1*  MCV 79.1* 80.4 79.4* 78.9* 80.9 79.5*  PLT 100* 200 223 188 181 168    Basic Metabolic Panel: Recent Labs  Lab 08/26/22 2247 08/27/22 1053 08/27/22 1313 08/28/22 0253 08/29/22 0900 08/30/22 0158  NA 135  --  131* 135 134* 132*  K 2.8*  --  2.9* 3.6 3.0* 2.9*  CL 95*  --  93* 95* 93* 97*  CO2 22  --  25 23 23 24   GLUCOSE 208*  --  302* 216* 220* 118*  BUN 30*  --  32* 18 27* 13  CREATININE 3.44* 3.64* 3.52* 2.54* 3.69* 2.56*  CALCIUM 9.1  --  8.8* 8.8* 8.7* 8.1*  MG  --   --   --   --   --  1.7  PHOS  --    --  5.5*  --   --  4.1    GFR: Estimated Creatinine Clearance: 22 mL/min (Evette Diclemente) (by C-G formula based on SCr of 2.56 mg/dL (H)).  Liver Function Tests: Recent Labs  Lab 08/26/22 2247 08/27/22 1313 08/28/22 0253 08/29/22 0900 08/30/22 0158  AST 18  --  17 14* 15  ALT 14  --  15 14 13   ALKPHOS 155*  --  144* 128* 112  BILITOT 0.8  --  0.8 0.9 0.7  PROT 6.4*  --  6.3* 5.9* 5.6*  ALBUMIN 2.8* 2.6* 2.7* 2.6* 2.4*    CBG: Recent Labs  Lab 08/29/22 1614 08/29/22 2246  08/30/22 0357 08/30/22 0719 08/30/22 0824  GLUCAP 144* 157* 90 56* 94     Recent Results (from the past 240 hour(s))  Gastrointestinal Panel by PCR , Stool     Status: None   Collection Time: 08/27/22  8:45 AM   Specimen: STOOL  Result Value Ref Range Status   Campylobacter species NOT DETECTED NOT DETECTED Final   Plesimonas shigelloides NOT DETECTED NOT DETECTED Final   Salmonella species NOT DETECTED NOT DETECTED Final   Yersinia enterocolitica NOT DETECTED NOT DETECTED Final   Vibrio species NOT DETECTED NOT DETECTED Final   Vibrio cholerae NOT DETECTED NOT DETECTED Final   Enteroaggregative E coli (EAEC) NOT DETECTED NOT DETECTED Final   Enteropathogenic E coli (EPEC) NOT DETECTED NOT DETECTED Final   Enterotoxigenic E coli (ETEC) NOT DETECTED NOT DETECTED Final   Shiga like toxin producing E coli (STEC) NOT DETECTED NOT DETECTED Final   Shigella/Enteroinvasive E coli (EIEC) NOT DETECTED NOT DETECTED Final   Cryptosporidium NOT DETECTED NOT DETECTED Final   Cyclospora cayetanensis NOT DETECTED NOT DETECTED Final   Entamoeba histolytica NOT DETECTED NOT DETECTED Final   Giardia lamblia NOT DETECTED NOT DETECTED Final   Adenovirus F40/41 NOT DETECTED NOT DETECTED Final   Astrovirus NOT DETECTED NOT DETECTED Final   Norovirus GI/GII NOT DETECTED NOT DETECTED Final   Rotavirus Corwyn Vora NOT DETECTED NOT DETECTED Final   Sapovirus (I, II, IV, and V) NOT DETECTED NOT DETECTED Final    Comment: Performed at  [facility], [address] [place], [place] [address]  C Difficile Quick Screen w PCR reflex     Status: Abnormal   Collection Time: 08/27/22  8:45 AM   Specimen: STOOL  Result Value Ref Range Status   C Diff antigen POSITIVE (Beautiful Pensyl) NEGATIVE Final   C Diff toxin NEGATIVE NEGATIVE Final   C Diff interpretation Results are indeterminate. See PCR results.  Final    Comment: Performed at [facility], [address] [place], [place] [address]  C. Diff by PCR, Reflexed     Status: None   Collection Time: 08/27/22  8:45 AM  Result Value Ref Range Status   Toxigenic C. Difficile by PCR NEGATIVE NEGATIVE Final    Comment: Patient is colonized with non toxigenic C. difficile. May not need treatment unless significant symptoms are present. Performed at [facility], [address] [place], [place] [address]          Radiology Studies: NM Hepatobiliary Liver Func  Result Date: 08/28/2022 CLINICAL DATA:  Right upper quadrant abdominal pain. EXAM: NUCLEAR MEDICINE HEPATOBILIARY IMAGING TECHNIQUE: Sequential images of the abdomen were obtained out to 60 minutes following intravenous administration of radiopharmaceutical. RADIOPHARMACEUTICALS:  5.2 mCi Tc-58m  Choletec IV COMPARISON:  CT scan and ultrasound examinations 08/27/2022 FINDINGS: Symmetric uptake in the liver and fairly prompt excretion into the biliary tree. Activity is seen in the small bowel by 30 minutes. The gallbladder is visualized at 25 minutes. IMPRESSION: No findings for cystic or common bile duct obstruction. Electronically Signed   By: Rudie Meyer M.D.   On: 08/28/2022 16:12        Scheduled Meds:  Chlorhexidine Gluconate Cloth  6 each Topical Q0600   insulin aspart  0-6 Units Subcutaneous Q4H   insulin glargine-yfgn  20 Units Subcutaneous Q supper   levothyroxine  75 mcg Oral QAC breakfast   midodrine  5 mg Oral 3 times per day on Mon Wed Fri   pantoprazole  40 mg Oral Daily   potassium chloride   20 mEq Oral Once   pregabalin  75 mg Oral Daily   sevelamer carbonate  1,600 mg Oral TID WC   sodium chloride flush  3 mL Intravenous Q12H   vortioxetine HBr  20 mg Oral Daily   Continuous Infusions:  cefTRIAXone (ROCEPHIN)  IV 2 g (08/29/22 2100)   metronidazole 500 mg (08/30/22 0430)   promethazine (PHENERGAN) injection (IM or IVPB) 6.25 mg (08/29/22 2239)     LOS: 3 days    Time spent: over 30 min    Lacretia Nicks, MD [facility]   To contact the attending provider between 7A-7P or the covering provider during after hours 7P-7A, please log into the web site [email or web address] and access using universal [place] password for that web site. If you do not have the password, please call the hospital operator.  08/30/2022, 9:05 AM

## 2022-08-30 NOTE — Evaluation (Signed)
Physical Therapy Evaluation Patient Details Name: Susan [name] MRN: [id number] DOB: 1950-10-06 Today's Date: 08/30/2022  History of Present Illness  72 y.o. female presenting to [facility] ED 4/3 from SNF with severe abdominal pain, nausea, and vomiting. Imaging notable for proctitis with mesenteric congestive changes in the lower abdomen and pelvis; also notable RUQ TTP, RUQ US shows distended gall bladder with small stones and trace pericholecystic fluid. Recent thrombectomy of L upper arm arteriovenous fistula/revision of L arm arteriovenous fistula 3/27. PMH significant for MI, CHF, CAD, ESRD, HTN, PAF, DMII, R BKA.  Clinical Impression  Pt was seen for mobility on bed with OT in co-visit, and pt requires two person skilled help to go through all her mobility choices and to encourage her to sit up.  Pt is declining to sit on side of bed due to leg and peri pain, but have been able to talk about her issues of skin on RLE that require compression to promote healing and prep for prosthesis.  Pt is permitted to use a shrinker but MD has agreed to ace wrap for the mean time since an appropriately fitted shrinker has not been presented at hosp.  Follow for goals of PT and recommending her to be considered for short stay rehab with less than 3 hours a day of tx.  Pt is in agreement.       Recommendations for follow up therapy are one component of a multi-disciplinary discharge planning process, led by the attending physician.  Recommendations may be updated based on patient status, additional functional criteria and insurance authorization.  Follow Up Recommendations       Assistance Recommended at Discharge Frequent or constant Supervision/Assistance  Patient can return home with the following  Two people to help with walking and/or transfers;A lot of help with bathing/dressing/bathroom;Assistance with cooking/housework;Direct supervision/assist for medications management;Direct supervision/assist for  financial management;Assist for transportation;Help with stairs or ramp for entrance    Equipment Recommendations None recommended by PT  Recommendations for Other Services       Functional Status Assessment Patient has had a recent decline in their functional status and demonstrates the ability to make significant improvements in function in a reasonable and predictable amount of time.     Precautions / Restrictions Precautions Precautions: Fall Precaution Comments: pt is demonstrating limtations from skin breakdown on peri area and discomfort of RLE Restrictions Weight Bearing Restrictions: No      Mobility  Bed Mobility Overal bed mobility: Needs Assistance (.) Bed Mobility: Rolling Rolling: Mod assist         General bed mobility comments: requires two person help for bed mob to roll with work on linen of bed and two for pulling up the bed    Transfers                   General transfer comment: pt declined    Ambulation/Gait               General Gait Details: declined  Information systems manager [place] (Stroke Patients Only)       Balance                                             Pertinent Vitals/Pain Pain Assessment Pain Assessment: Faces Faces Pain Scale: Hurts  even more Pain Location: peri area and RLE at surgery Pain Descriptors / Indicators: Grimacing, Guarding, Aching Pain Intervention(s): Monitored during session, Repositioned    Home Living Family/patient expects to be discharged to:: Skilled nursing facility Living Arrangements: Other (Comment)                 Additional Comments: husband is gone driving truck until dinner    Prior Function Prior Level of Function : Needs assist (.husband is driving and only home at dinner)       Physical Assist : Mobility (physical) Mobility (physical): Bed mobility;Transfers   Mobility Comments: lift to get to chair ADLs  Comments: requires assist for ADLs bed level, pt reports self feeding, grooming     Hand Dominance   Dominant Hand: Right    Extremity/Trunk Assessment   Upper Extremity Assessment Upper Extremity Assessment: Defer to OT evaluation    Lower Extremity Assessment Lower Extremity Assessment: Generalized weakness    Cervical / Trunk Assessment Cervical / Trunk Assessment: Kyphotic  Communication   Communication: No difficulties  Cognition Arousal/Alertness: Awake/alert Behavior During Therapy: WFL for tasks assessed/performed Overall Cognitive Status: No family/caregiver present to determine baseline cognitive functioning Area of Impairment: Safety/judgement                         Safety/Judgement: Decreased awareness of safety     General Comments: taking her time but also encouraging her        General Comments General comments (skin integrity, edema, etc.): pt was seen for mobility on bed to roll and reposition, while getting cleaned up. Refuses to move to side of bed due to pain, but is expecting to have a procedure soon and may be impacting as well    Exercises     Assessment/Plan    PT Assessment Patient needs continued PT services  PT Problem List Decreased strength;Decreased range of motion;Decreased activity tolerance;Decreased balance;Decreased mobility;Decreased coordination;Decreased knowledge of use of DME;Decreased safety awareness;Obesity;Decreased skin integrity;Pain       PT Treatment Interventions      PT Goals (Current goals can be found in the Care Plan section)  Acute Rehab PT Goals Patient Stated Goal: to get home again PT Goal Formulation: With patient Time For Goal Achievement: 09/13/22 Potential to Achieve Goals: Good    Frequency Min 3X/week     Co-evaluation               AM-PAC PT "6 Clicks" Mobility  Outcome Measure                  End of Session Equipment Utilized During Treatment: Gait belt Activity  Tolerance: Patient limited by fatigue;Treatment limited secondary to medical complications (Comment);Patient limited by pain Patient left: in bed;with call bell/phone within reach;with bed alarm set;Other (comment) (ECHO tech arrived) Nurse Communication: Need for lift equipment;Mobility status PT Visit Diagnosis: Unsteadiness on feet (R26.81);Other abnormalities of gait and mobility (R26.89);Muscle weakness (generalized) (M62.81);Difficulty in walking, not elsewhere classified (R26.2);Pain Pain - Right/Left: Right Pain - part of body: Leg (perineum)    Time: 3016-0109 PT Time Calculation (min) (ACUTE ONLY): 25 min   Charges:   PT Evaluation $PT Eval Moderate Complexity: 1 Mod         Ivar Drape 08/30/2022, 5:47 PM  Samul Dada, PT PhD Acute Rehab Dept. Number: [facility] [id number] and [facility] [phone]

## 2022-08-31 ENCOUNTER — Encounter: Payer: Self-pay | Admitting: Orthopedic Surgery

## 2022-08-31 DIAGNOSIS — R109 Unspecified abdominal pain: Secondary | ICD-10-CM

## 2022-08-31 DIAGNOSIS — R112 Nausea with vomiting, unspecified: Secondary | ICD-10-CM | POA: Diagnosis not present

## 2022-08-31 DIAGNOSIS — R197 Diarrhea, unspecified: Secondary | ICD-10-CM | POA: Diagnosis not present

## 2022-08-31 DIAGNOSIS — R1013 Epigastric pain: Secondary | ICD-10-CM | POA: Diagnosis not present

## 2022-08-31 LAB — GLUCOSE, CAPILLARY
Glucose-Capillary: 54 mg/dL — ABNORMAL LOW (ref 70–99)
Glucose-Capillary: 118 mg/dL — ABNORMAL HIGH (ref 70–99)
Glucose-Capillary: 99 mg/dL (ref 70–99)
Glucose-Capillary: 188 mg/dL — ABNORMAL HIGH (ref 70–99)
Glucose-Capillary: 189 mg/dL — ABNORMAL HIGH (ref 70–99)
Glucose-Capillary: 292 mg/dL — ABNORMAL HIGH (ref 70–99)

## 2022-08-31 LAB — COMPREHENSIVE METABOLIC PANEL
ALT: 12 U/L (ref 0–44)
AST: 15 U/L (ref 15–41)
Albumin: 2.4 g/dL — ABNORMAL LOW (ref 3.5–5.0)
Alkaline Phosphatase: 111 U/L (ref 38–126)
Anion gap: 6 (ref 5–15)
BUN: 20 mg/dL (ref 8–23)
CO2: 25 mmol/L (ref 22–32)
Calcium: 7.9 mg/dL — ABNORMAL LOW (ref 8.9–10.3)
Chloride: 97 mmol/L — ABNORMAL LOW (ref 98–111)
Creatinine, Ser: 3.27 mg/dL — ABNORMAL HIGH (ref 0.44–1.00)
GFR, Estimated: 14 mL/min — ABNORMAL LOW (ref >60)
Glucose, Bld: 55 mg/dL — ABNORMAL LOW (ref 70–99)
Potassium: 3.2 mmol/L — ABNORMAL LOW (ref 3.5–5.1)
Sodium: 128 mmol/L — ABNORMAL LOW (ref 135–145)
Total Bilirubin: 0.6 mg/dL (ref 0.3–1.2)
Total Protein: 5.2 g/dL — ABNORMAL LOW (ref 6.5–8.1)

## 2022-08-31 LAB — CBC WITH DIFFERENTIAL/PLATELET
Abs Immature Granulocytes: 0.01 10*3/uL (ref 0.00–0.07)
Basophils Absolute: 0 10*3/uL (ref 0.0–0.1)
Basophils Relative: 1 %
Eosinophils Absolute: 0.2 10*3/uL (ref 0.0–0.5)
Eosinophils Relative: 6 %
HCT: 31.3 % — ABNORMAL LOW (ref 36.0–46.0)
Hemoglobin: 9.2 g/dL — ABNORMAL LOW (ref 12.0–15.0)
Immature Granulocytes: 0 %
Lymphocytes Relative: 21 %
Lymphs Abs: 0.8 10*3/uL (ref 0.7–4.0)
MCH: 23.4 pg — ABNORMAL LOW (ref 26.0–34.0)
MCHC: 29.4 g/dL — ABNORMAL LOW (ref 30.0–36.0)
MCV: 79.6 fL — ABNORMAL LOW (ref 80.0–100.0)
Monocytes Absolute: 0.6 10*3/uL (ref 0.1–1.0)
Monocytes Relative: 17 %
Neutro Abs: 2.1 10*3/uL (ref 1.7–7.7)
Neutrophils Relative %: 55 %
Platelets: 170 10*3/uL (ref 150–400)
RBC: 3.93 MIL/uL (ref 3.87–5.11)
RDW: 20 % — ABNORMAL HIGH (ref 11.5–15.5)
WBC: 3.9 10*3/uL — ABNORMAL LOW (ref 4.0–10.5)
nRBC: 0 % (ref 0.0–0.2)

## 2022-08-31 LAB — MAGNESIUM: Magnesium: 1.7 mg/dL (ref 1.7–2.4)

## 2022-08-31 LAB — PHOSPHORUS: Phosphorus: 4.2 mg/dL (ref 2.5–4.6)

## 2022-08-31 LAB — HEPARIN LEVEL (UNFRACTIONATED)
Heparin Unfractionated: <0.1 IU/mL — ABNORMAL LOW (ref 0.30–0.70)
Heparin Unfractionated: <0.1 IU/mL — ABNORMAL LOW (ref 0.30–0.70)

## 2022-08-31 MED ORDER — DEXTROSE 50 % IV SOLN
INTRAVENOUS | Status: AC
  Filled 2022-08-31: qty 50

## 2022-08-31 MED ORDER — PANTOPRAZOLE SODIUM 40 MG PO TBEC
40.0000 mg | DELAYED_RELEASE_TABLET | Freq: Two times a day (BID) | ORAL | Status: DC
  Administered 2022-08-31 – 2022-09-05 (×10): 40 mg via ORAL
  Filled 2022-08-31 (×10): qty 1

## 2022-08-31 MED ORDER — INSULIN ASPART 100 UNIT/ML IJ SOLN
0.0000 [IU] | Freq: Three times a day (TID) | INTRAMUSCULAR | Status: DC
  Administered 2022-08-31 (×2): 1 [IU] via SUBCUTANEOUS
  Administered 2022-09-01: 4 [IU] via SUBCUTANEOUS
  Administered 2022-09-01: 5 [IU] via SUBCUTANEOUS
  Administered 2022-09-01: 4 [IU] via SUBCUTANEOUS
  Administered 2022-09-02 – 2022-09-03 (×4): 3 [IU] via SUBCUTANEOUS
  Administered 2022-09-03: 4 [IU] via SUBCUTANEOUS
  Administered 2022-09-04: 3 [IU] via SUBCUTANEOUS
  Administered 2022-09-04: 2 [IU] via SUBCUTANEOUS
  Administered 2022-09-05: 1 [IU] via SUBCUTANEOUS
  Filled 2022-08-31: qty 1

## 2022-08-31 MED ORDER — INSULIN GLARGINE-YFGN 100 UNIT/ML ~~LOC~~ SOLN
5.0000 [IU] | Freq: Every day | SUBCUTANEOUS | Status: DC
  Administered 2022-08-31: 5 [IU] via SUBCUTANEOUS
  Filled 2022-08-31 (×2): qty 0.05

## 2022-08-31 MED ORDER — INSULIN ASPART 100 UNIT/ML IJ SOLN
0.0000 [IU] | Freq: Every day | INTRAMUSCULAR | Status: DC
  Administered 2022-08-31: 3 [IU] via SUBCUTANEOUS
  Administered 2022-09-02 – 2022-09-03 (×2): 5 [IU] via SUBCUTANEOUS
  Administered 2022-09-04: 3 [IU] via SUBCUTANEOUS

## 2022-08-31 MED ORDER — DEXTROSE 50 % IV SOLN
25.0000 g | INTRAVENOUS | Status: AC
  Administered 2022-08-31: 25 g via INTRAVENOUS

## 2022-08-31 NOTE — Progress Notes (Signed)
PROGRESS NOTE    Susan [name]  ZOX:[id number] DOB: 12/20/1950 DOA: 08/26/2022 PCP: Donita Brooks, MD  Chief Complaint  Patient presents with   Abdominal Pain    Brief Narrative:   72 yo F with ESRD, diastolic HF, peripheral neuropathy, HTN, hypothyroidism, and T2DM presenting with nausea, vomiting, and abdominal pain. These symptoms developed after eating mac and cheese and ham leftover from Easter. Imaging notable for proctitis with mesenteric congestive changes in the lower abdomen and pelvis. Also with notable RUQ TTP, RUQ US shows distended gallbladder with small stones and trace pericholecystic fluid. Currently on antibiotics to cover proctocolitis.   Assessment & Plan:   Principal Problem:   Abdominal pain  Abdominal pain  Intractable Nausea CT with proctitis with mesenteric congestive changes in lower abdomen and pelvis Continuing abx for now to cover proctitis - notably, hx of bleeding rectal ulcer, oversewn 12/230 by general surgery Flex sig 12/30 with multiple ulcers in rectum  RUQ TTP concerning for cholecystitis, will follow HIDA -> negative Repeat CT scan shows suspected renal infarct, gallbladder distension with cholelithiasis, suspected cystitis, cardiomegaly with asymmetric R chamber predominance, small pericardial effusion, moderate L and small R layering effusions, subacute to late stage splenic infarct vs resolving infectious process, severe circumferential rectal thickening with adjacent stranding GI path panel negative.  C diff PCR negative, antigen positive  Clear liquids for now - still having issues tolerating PO  Will consult gastroenterology due to persistent symptoms which haven't significantly improved with antibiotics, appreciate their assistance - they suspect proctitis and mesenteric congestion are stable - ap related to volume? Direct visualization recommended of rectal thickening, will defer decision to GI (she did have flex sig 05/24/22) Antiemetics  prn   Volume overload Body wall anasarca increased per imaging Generalized interstitial edema and L>R effusions Ground glass edema Volume per renal -> dialysis per renal    ESRD Per renal    Hyponatremia Suspect related to hypervolemia  Volume per renal   Hypokalemia Replace cautiously, follow   Imaging concerning for cystitis Follow urine culture -> multiple species    Hypodense abnormality in superomedial aspect of spleen  Splenic Infarct Evolving infarct vs burned out infectious process -> Multiple infarcts per my discussion with radiology -> suspect this is in the setting of atrial fibrillation?  Heparin gtt started  Renal Infarct Wedge shaped hypoenhancement in upper to midpole R kidney, likely renal infarct Anticoagulation as noted above - suspect due to fib  Hx Afib Hold amiodarone with qtc prolongation Anticoagulation on hold due to hx rectal ulcer  Heparin gtt started   T2DM  Hypoglycemia Hypoglycemia again, will cut basal and SSI frequency SSI Holding tradjenta   Thrombocytopenia Resolved  Prolonged Qtc Repeat EKG -> improved Qtc 442 Caution with qt prolonging meds Hold amio for now   Hypertension On midodrine with dialysis Has prn hydral? Would stop this at d/c  Hypothyroidism Synthroid    DVT prophylaxis: heparin Code Status: full Family Communication: none Disposition:   Status is: Inpatient Remains inpatient appropriate because: need for inpatient care   Consultants:  GI  Procedures:  none  Antimicrobials:  Anti-infectives (From admission, onward)    Start     Dose/Rate Route Frequency Ordered Stop   08/28/22 0400  metroNIDAZOLE (FLAGYL) IVPB 500 mg        500 mg 100 mL/hr over 60 Minutes Intravenous Every 8 hours 08/27/22 2048     08/27/22 2200  vancomycin (VANCOCIN) 50 mg/mL oral solution SOLN 125 mg  Status:  Discontinued        125 mg Oral Daily at bedtime 08/27/22 1946 08/27/22 2042   08/27/22 2200  vancomycin  (VANCOCIN) capsule 125 mg  Status:  Discontinued        125 mg Oral Daily at bedtime 08/27/22 2042 08/27/22 2048   08/27/22 2045  cefTRIAXone (ROCEPHIN) 2 g in sodium chloride 0.9 % 100 mL IVPB        2 g 200 mL/hr over 30 Minutes Intravenous Every 24 hours 08/27/22 1949     08/27/22 1000  metroNIDAZOLE (FLAGYL) IVPB 500 mg  Status:  Discontinued        500 mg 100 mL/hr over 60 Minutes Intravenous Every 12 hours 08/27/22 0848 08/27/22 2048       Subjective: Asking to eat something more   Objective: Vitals:   08/30/22 0839 08/30/22 1631 08/30/22 2056 08/31/22 0410  BP: 114/61 (!) 110/59 (!) 124/58 122/60  Pulse: (!) 101 98 97 99  Resp: 15 14 18 16   Temp: (!) 97.5 F (36.4 C) 97.8 F (36.6 C) 97.8 F (36.6 C) 97.9 F (36.6 C)  TempSrc: Oral Oral Oral Oral  SpO2: 100% 100% 100% 100%  Weight:   89 kg   Height:        Intake/Output Summary (Last 24 hours) at 08/31/2022 0852 Last data filed at 08/31/2022 0554 Gross per 24 hour  Intake 4063.69 ml  Output 0 ml  Net 4063.69 ml   Filed Weights   08/29/22 0900 08/29/22 1330 08/30/22 2056  Weight: 90.3 kg 89.1 kg 89 kg    Examination:  General: No acute distress.  Overall, looks better today, more comfortable Cardiovascular: RRR Lungs: unlabored Abdomen: diffuse ttp, still with suprapubic TTP that is impressive Neurological: Alert and oriented 3. Moves all extremities 4 with equal strength. Cranial nerves II through XII grossly intact. Extremities: R BKA   Data Reviewed: I have personally reviewed following labs and imaging studies  CBC: Recent Labs  Lab 08/26/22 2247 08/27/22 1053 08/27/22 1313 08/28/22 0253 08/29/22 0900 08/30/22 0158 08/31/22 0314  WBC 9.2   < > 7.5 8.2 5.6 5.0 3.9*  NEUTROABS 8.1*  --   --   --   --  3.5 2.1  HGB 10.3*   < > 9.6* 10.1* 9.4* 9.4* 9.2*  HCT 35.3*   < > 32.8* 34.8* 33.0* 32.1* 31.3*  MCV 79.1*   < > 79.4* 78.9* 80.9 79.5* 79.6*  PLT 100*   < > 223 188 181 168 170   < > =  values in this interval not displayed.    Basic Metabolic Panel: Recent Labs  Lab 08/27/22 1313 08/28/22 0253 08/29/22 0900 08/30/22 0158 08/31/22 0314  NA 131* 135 134* 132* 128*  K 2.9* 3.6 3.0* 2.9* 3.2*  CL 93* 95* 93* 97* 97*  CO2 25 23 23 24 25   GLUCOSE 302* 216* 220* 118* 55*  BUN 32* 18 27* 13 20  CREATININE 3.52* 2.54* 3.69* 2.56* 3.27*  CALCIUM 8.8* 8.8* 8.7* 8.1* 7.9*  MG  --   --   --  1.7 1.7  PHOS 5.5*  --   --  4.1 4.2    GFR: Estimated Creatinine Clearance: 17.2 mL/min (Susan [name]) (by C-G formula based on SCr of 3.27 mg/dL (H)).  Liver Function Tests: Recent Labs  Lab 08/26/22 2247 08/27/22 1313 08/28/22 0253 08/29/22 0900 08/30/22 0158 08/31/22 0314  AST 18  --  17 14* 15 15  ALT 14  --  15 14 13 12   ALKPHOS 155*  --  144* 128* 112 111  BILITOT 0.8  --  0.8 0.9 0.7 0.6  PROT 6.4*  --  6.3* 5.9* 5.6* 5.2*  ALBUMIN 2.8* 2.6* 2.7* 2.6* 2.4* 2.4*    CBG: Recent Labs  Lab 08/30/22 1638 08/30/22 2059 08/31/22 0525 08/31/22 0630 08/31/22 0744  GLUCAP 130* 170* 54* 118* 99     Recent Results (from the past 240 hour(s))  Gastrointestinal Panel by PCR , Stool     Status: None   Collection Time: 08/27/22  8:45 AM   Specimen: STOOL  Result Value Ref Range Status   Campylobacter species NOT DETECTED NOT DETECTED Final   Plesimonas shigelloides NOT DETECTED NOT DETECTED Final   Salmonella species NOT DETECTED NOT DETECTED Final   Yersinia enterocolitica NOT DETECTED NOT DETECTED Final   Vibrio species NOT DETECTED NOT DETECTED Final   Vibrio cholerae NOT DETECTED NOT DETECTED Final   Enteroaggregative E coli (EAEC) NOT DETECTED NOT DETECTED Final   Enteropathogenic E coli (EPEC) NOT DETECTED NOT DETECTED Final   Enterotoxigenic E coli (ETEC) NOT DETECTED NOT DETECTED Final   Shiga like toxin producing E coli (STEC) NOT DETECTED NOT DETECTED Final   Shigella/Enteroinvasive E coli (EIEC) NOT DETECTED NOT DETECTED Final   Cryptosporidium NOT DETECTED NOT  DETECTED Final   Cyclospora cayetanensis NOT DETECTED NOT DETECTED Final   Entamoeba histolytica NOT DETECTED NOT DETECTED Final   Giardia lamblia NOT DETECTED NOT DETECTED Final   Adenovirus F40/41 NOT DETECTED NOT DETECTED Final   Astrovirus NOT DETECTED NOT DETECTED Final   Norovirus GI/GII NOT DETECTED NOT DETECTED Final   Rotavirus Susan [name] NOT DETECTED NOT DETECTED Final   Sapovirus (I, II, IV, and V) NOT DETECTED NOT DETECTED Final    Comment: Performed at [facility], [address] [place], [place] [address]  C Difficile Quick Screen w PCR reflex     Status: Abnormal   Collection Time: 08/27/22  8:45 AM   Specimen: STOOL  Result Value Ref Range Status   C Diff antigen POSITIVE (Susan [name]) NEGATIVE Final   C Diff toxin NEGATIVE NEGATIVE Final   C Diff interpretation Results are indeterminate. See PCR results.  Final    Comment: Performed at [facility], [address], [place], [place] [address]  C. Diff by PCR, Reflexed     Status: None   Collection Time: 08/27/22  8:45 AM  Result Value Ref Range Status   Toxigenic C. Difficile by PCR NEGATIVE NEGATIVE Final    Comment: Patient is colonized with non toxigenic C. difficile. May not need treatment unless significant symptoms are present. Performed at [facility], [address], [place], [place] [address]   Urine Culture     Status: Abnormal   Collection Time: 08/29/22  5:28 AM   Specimen: Urine, Random  Result Value Ref Range Status   Specimen Description URINE, RANDOM  Final   Special Requests   Final    NONE Reflexed from [id number] Performed at [facility], [address] [place], [place] [address]    Culture MULTIPLE SPECIES PRESENT, SUGGEST RECOLLECTION (Susan [name])  Final   Report Status 08/30/2022 FINAL  Final         Radiology Studies: CT ABDOMEN PELVIS W CONTRAST  Result Date: 08/30/2022 CLINICAL DATA:  Abdominal pain. EXAM: CT ABDOMEN AND PELVIS WITH CONTRAST TECHNIQUE: Multidetector CT imaging of  the abdomen and pelvis was performed using the standard  protocol following bolus administration of intravenous contrast. RADIATION DOSE REDUCTION: This exam was performed according to the departmental dose-optimization program which includes automated exposure control, adjustment of the mA and/or kV according to patient size and/or use of iterative reconstruction technique. CONTRAST:  98mL OMNIPAQUE IOHEXOL 350 MG/ML SOLN COMPARISON:  Recent CTA chest, abdomen and pelvis 08/27/2022, CT chest and upper abdomen without contrast 06/12/2022. FINDINGS: Lower chest: Stable cardiomegaly, today with an asymmetric right chamber predominance. Three-vessel calcific CAD. Infusion catheter again terminates at the superior cavoatrial junction and Morgan Rennert small pericardial effusion again collecting to the right anteriorly. Still seen are Alonso Gapinski moderate sized left and small right layering pleural effusions with compressive atelectasis or consolidation in the basal segments of the left lower lobe. Thin walled posterior basal right lower lobe air cyst is again noted. There previously was interstitial edema in the lungs which is not seen today. The pulmonary veins are no longer dilated. There are calcified right hilar nodes. Hepatobiliary: The gallbladder is increasingly distended up to 12 cm in length. Small stones layer in the proximal lumen. No wall thickening or bile duct dilatation. There are calcified granulomas in the liver but no mass enhancement. Pancreas: No mass, ductal dilatation or inflammatory change. Spleen: Again measuring mildly prominent 14.7 cm AP, capsular contraction again noted over Susan [name] small hypodensity in the superior spleen which most likely either represents Susan [name] subacute to late stage infarct or resolving infectious process, and was considerably larger on 06/12/2022. Scattered calcified granulomas with no other focal abnormality. Adrenals/Urinary Tract: Chronic nodular thickening both adrenal glands is unchanged. There are  renovascular calcifications. Mild global volume loss of both kidneys is again shown. No stone or urinary obstruction is seen. There is Susan [name] new wedge-shaped hypoenhancement in the upper to midpole right kidney laterally (series 4 axial 40 and 41 and coronal reconstruction image 78 of series 7). This is most likely Susan [name] renal infarct, alternatively could be related to pyelonephritis but there are no adjacent inflammatory changes. The remainder of the kidneys enhance homogeneously. There is faint contrast in the bladder. Mild bladder thickening and perivesical haziness compatible with cystitis continues to be seen. There is no bladder mass. Stomach/Bowel: Contracted stomach. No small bowel obstruction or inflammation is seen. The appendix contains contrast and is normal caliber. There or continued mesenteric congestive changes in the mid to lower abdomen and pelvis. There are colonic diverticula greatest in the sigmoid without Susan [name] focal diverticulitis. Severe circumferential rectal thickening continues to be seen with adjacent stranding and has been seen on previous studies as well warranting direct visualization to exclude underlying lesion. Vascular/Lymphatic: Aortoiliac and extensive visceral branch vessel atherosclerosis. No AAA. No adenopathy. Reproductive: Status post hysterectomy. No adnexal masses. Other: Small amount of posterior pelvic ascites is again noted, trace fluid in the pericolic gutters and scattered within the mesenteric folds. Body wall anasarca noted moderately and appears similar. No free air, free hemorrhage or abscess. Musculoskeletal: Osteopenia and degenerative skeletal changes. No primary destructive or pathologic process. IMPRESSION: 1. New wedge-shaped hypoenhancement in the upper to midpole right kidney laterally, most likely Susan [name] renal infarct, less likely pyelonephritis. 2. Increasingly distended gallbladder with cholelithiasis but no wall thickening or bile duct dilatation. 3. Likely cystitis,  seen previously. 4. Cardiomegaly with asymmetric right chamber predominance. Stable small pericardial effusion. 5. Moderate left and small right layering pleural effusions with compressive atelectasis or consolidation left lower lobe. Interstitial edema is no longer seen in the visualized lungs. 6. Stable small pericardial effusion. 7. Stable mild splenomegaly  with capsular contraction over Susan [name] small hypodensity in the superior spleen which most likely either represents Simisola Sandles subacute to late stage infarct or resolving infectious process. 8. Continued severe circumferential rectal thickening with adjacent stranding. This could be due to proctitis or neoplasm. Direct visualization recommended unless already done. 9. Anasarca, mesenteric congestive changes, small amount of ascites similar to 3 days ago. 10. Diverticulosis without diverticulitis. 11. Aortic and coronary artery atherosclerosis. Aortic Atherosclerosis (ICD10-I70.0). Electronically Signed   By: Almira Bar M.D.   On: 08/30/2022 20:52   ECHOCARDIOGRAM COMPLETE  Result Date: 08/30/2022    ECHOCARDIOGRAM REPORT   Patient Name:   Susan [name] Date of Exam: 08/30/2022 Medical Rec #:  [id number]    Height:       66.0 in Accession #:    [id number]   Weight:       196.4 lb Date of Birth:  1950/09/04    BSA:          1.985 m Patient Age:    72 years     BP:           114/61 mmHg Patient Gender: F            HR:           103 bpm. Exam Location:  Inpatient Procedure: 2D Echo, Cardiac Doppler, Color Doppler and Intracardiac            Opacification Agent Indications:    Other abnormalities of the heart R00.8  History:        Patient has prior history of Echocardiogram examinations, most                 recent 02/02/2022. CHF, CAD and Previous Myocardial Infarction,                 Stroke, Arrythmias:Tachycardia and RBBB, Signs/Symptoms:Syncope                 and Shortness of Breath; Risk Factors:Former Smoker,                 Dyslipidemia and Hypertension.   Sonographer:    Susan [name] Baba Referring Phys: [phone] Susan [name]  Sonographer Comments: Suboptimal apical window. Image acquisition challenging due to patient body habitus and Image acquisition challenging due to respiratory motion. IMPRESSIONS  1. Global hypokinesis with apical akinesis; overall moderate to severe LV dysfunction.  2. Left ventricular ejection fraction, by estimation, is 30 to 35%. The left ventricle has moderate to severely decreased function. The left ventricle demonstrates regional wall motion abnormalities (see scoring diagram/findings for description). There is mild left ventricular hypertrophy. Left ventricular diastolic parameters are indeterminate. Elevated left atrial pressure.  3. Right ventricular systolic function is mildly reduced. The right ventricular size is moderately enlarged. There is normal pulmonary artery systolic pressure.  4. Left atrial size was moderately dilated.  5. Right atrial size was mildly dilated.  6. Moderate pleural effusion in the left lateral region.  7. The mitral valve is normal in structure. No evidence of mitral valve regurgitation. No evidence of mitral stenosis.  8. Tricuspid valve regurgitation is moderate.  9. The aortic valve is normal in structure. Aortic valve regurgitation is not visualized. Aortic valve sclerosis/calcification is present, without any evidence of aortic stenosis. 10. The inferior vena cava is normal in size with greater than 50% respiratory variability, suggesting right atrial pressure of 3 mmHg. FINDINGS  Left Ventricle: Left ventricular ejection fraction, by estimation, is 30 to 35%. The  left ventricle has moderate to severely decreased function. The left ventricle demonstrates regional wall motion abnormalities. Definity contrast agent was given IV to delineate the left ventricular endocardial borders. The left ventricular internal cavity size was normal in size. There is mild left ventricular hypertrophy. Left ventricular  diastolic parameters are indeterminate. Elevated left atrial pressure. Right Ventricle: The right ventricular size is moderately enlarged. Right ventricular systolic function is mildly reduced. There is normal pulmonary artery systolic pressure. The tricuspid regurgitant velocity is 2.58 m/s, and with an assumed right atrial pressure of 8 mmHg, the estimated right ventricular systolic pressure is 34.6 mmHg. Left Atrium: Left atrial size was moderately dilated. Right Atrium: Right atrial size was mildly dilated. Pericardium: There is no evidence of pericardial effusion. Mitral Valve: The mitral valve is normal in structure. Mild mitral annular calcification. No evidence of mitral valve regurgitation. No evidence of mitral valve stenosis. Tricuspid Valve: The tricuspid valve is normal in structure. Tricuspid valve regurgitation is moderate . No evidence of tricuspid stenosis. Aortic Valve: The aortic valve is normal in structure. Aortic valve regurgitation is not visualized. Aortic valve sclerosis/calcification is present, without any evidence of aortic stenosis. Pulmonic Valve: The pulmonic valve was normal in structure. Pulmonic valve regurgitation is not visualized. No evidence of pulmonic stenosis. Aorta: The aortic root is normal in size and structure. Venous: The inferior vena cava is normal in size with greater than 50% respiratory variability, suggesting right atrial pressure of 3 mmHg. IAS/Shunts: No atrial level shunt detected by color flow Doppler. Additional Comments: Global hypokinesis with apical akinesis; overall moderate to severe LV dysfunction. There is Susan [name] moderate pleural effusion in the left lateral region.  LEFT VENTRICLE PLAX 2D LVIDd:         5.10 cm   Diastology LVIDs:         4.10 cm   LV e' medial:    2.68 cm/s LV PW:         1.30 cm   LV E/e' medial:  51.9 LV IVS:        1.00 cm   LV e' lateral:   10.80 cm/s LVOT diam:     1.70 cm   LV E/e' lateral: 12.9 LV SV:         39 LV SV Index:   19  LVOT Area:     2.27 cm  RIGHT VENTRICLE RV S prime:     2.92 cm/s TAPSE (M-mode): 1.0 cm LEFT ATRIUM             Index        RIGHT ATRIUM           Index LA diam:        3.60 cm 1.81 cm/m   RA Area:     22.70 cm LA Vol (A2C):   97.5 ml 49.13 ml/m  RA Volume:   66.70 ml  33.61 ml/m LA Vol (A4C):   69.3 ml 34.92 ml/m LA Biplane Vol: 83.3 ml 41.97 ml/m  AORTIC VALVE             PULMONIC VALVE LVOT Vmax:   105.00 cm/s PR End Diast Vel: 3.44 msec LVOT Vmean:  68.700 cm/s LVOT VTI:    0.170 m  AORTA Ao Root diam: 3.40 cm Ao Asc diam:  3.20 cm MITRAL VALVE                TRICUSPID VALVE MV Area (PHT): 5.62 cm     TR Peak grad:  26.6 mmHg MV Decel Time: 135 msec     TR Vmax:        258.00 cm/s MV E velocity: 139.00 cm/s MV Helane Briceno velocity: 36.90 cm/s   SHUNTS MV E/Lynnix Schoneman ratio:  3.77         Systemic VTI:  0.17 m                             Systemic Diam: 1.70 cm Olga Millers MD Electronically signed by Olga Millers MD Signature Date/Time: 08/30/2022/2:36:28 PM    Final         Scheduled Meds:  Chlorhexidine Gluconate Cloth  6 each Topical Q0600   dextrose       insulin aspart  0-6 Units Subcutaneous Q4H   insulin glargine-yfgn  10 Units Subcutaneous Q supper   levothyroxine  75 mcg Oral QAC breakfast   midodrine  5 mg Oral TID WC   pantoprazole  40 mg Oral Daily   pregabalin  75 mg Oral Daily   sevelamer carbonate  1,600 mg Oral TID WC   sodium chloride flush  3 mL Intravenous Q12H   vortioxetine HBr  20 mg Oral Daily   Continuous Infusions:  cefTRIAXone (ROCEPHIN)  IV 2 g (08/30/22 2101)   heparin 1,100 Units/hr (08/30/22 2355)   metronidazole 500 mg (08/31/22 0523)   promethazine (PHENERGAN) injection (IM or IVPB) 6.25 mg (08/30/22 1128)     LOS: 4 days    Time spent: over 30 min    Lacretia Nicks, MD [facility]   To contact the attending provider between 7A-7P or the covering provider during after hours 7P-7A, please log into the web site [email or web address] and access using  universal [place] password for that web site. If you do not have the password, please call the hospital operator.  08/31/2022, 8:52 AM

## 2022-08-31 NOTE — Progress Notes (Signed)
ANTICOAGULATION CONSULT NOTE - Follow Up Consult  Pharmacy Consult for Heparin Indication:  Afib with new splenic infarct, and potential renal infarct  Allergies  Allergen Reactions   Cephalexin Diarrhea and Other (See Comments)   Codeine Nausea And Vomiting and Other (See Comments)    Patient Measurements: Height: 5\' 6"  (167.6 cm) Weight: 89 kg (196 lb 3.4 oz) IBW/kg (Calculated) : 59.3 Heparin Dosing Weight: 80 kg  Vital Signs: Temp: 97.9 F (36.6 C) (04/07 0410) Temp Source: Oral (04/07 0410) BP: 122/60 (04/07 0410) Pulse Rate: 99 (04/07 0410)  Labs: Recent Labs    08/29/22 0900 08/30/22 0158 08/31/22 0314 08/31/22 0739  HGB 9.4* 9.4* 9.2*  --   HCT 33.0* 32.1* 31.3*  --   PLT 181 168 170  --   HEPARINUNFRC  --   --   --  <0.10*  CREATININE 3.69* 2.56* 3.27*  --    ESRD -  usual MWF HD  Assessment: Patient presenting with abdominal pain, nausea, and vomiting. Patient has Afib without current anticoagulation - on apixaban previously but held due to rectal ulcers in 12/23. CT abdomen noted new splenic infarct as well as potential renal infarct. GI provider would like to [facility] to prevent further infarcts. Heparin 5000 units SQ q8h stopped after am dose 4/6.  Will need close monitoring of labs and for signs of bleeding due to risk of bleed.   Heparin drip begun last night at 1100 units/hr. Initial heparin level is undetectable (< 0.10). RN reports bleeding from IV site this am, and new site established. Uncertain whether infusion was infusing properly. Hemoglobin 10.3 on admit, trended down to 9.2. Platelet count 100K on admit > now normal 170.  Goal of Therapy:  Heparin level 0.3-0.7 units/ml, but will target lower end of range Monitor platelets by anticoagulation protocol: Yes   Plan:  Continue heparin drip at 1100 units/hr. Heparin level ~8 hrs after new IV site established. Daily heparin level and CBC. Monitor for signs/symptoms of bleeding.  Dennie Fetters, [place] 08/31/2022,9:45 AM

## 2022-08-31 NOTE — Progress Notes (Signed)
Office Visit Note   Patient: Susan [name]           Date of Birth: 1950/11/25           MRN: [name] Visit Date: 08/26/2022              Requested by: Donita BrooksPickard, Warren T, MD [address] [place] [id number] PCP: Donita BrooksPickard, Warren T, MD  Chief Complaint  Patient presents with   Right Leg - Routine Post Op    06/13/2022 right BKA revision       HPI: Patient is a 72 year old woman who is almost 3 months status post right transtibial amputation revision.  Assessment & Plan: Visit Diagnoses:  1. Right below-knee amputee     Plan: Prescription was provided for [facility] for prosthetic fitting.  Follow-Up Instructions: Return in about 4 weeks (around 09/23/2022).   Ortho Exam  Patient is alert, oriented, no adenopathy, well-dressed, normal affect, normal respiratory effort. Examination the surgical incision is well-healed patient is currently sitting on a Hoyer lift pad.  She is unable to ambulate on the left leg.  Imaging: CT ABDOMEN PELVIS W CONTRAST  Result Date: 08/30/2022 CLINICAL DATA:  Abdominal pain. EXAM: CT ABDOMEN AND PELVIS WITH CONTRAST TECHNIQUE: Multidetector CT imaging of the abdomen and pelvis was performed using the standard protocol following bolus administration of intravenous contrast. RADIATION DOSE REDUCTION: This exam was performed according to the departmental dose-optimization program which includes automated exposure control, adjustment of the mA and/or kV according to patient size and/or use of iterative reconstruction technique. CONTRAST:  75mL OMNIPAQUE IOHEXOL 350 MG/ML SOLN COMPARISON:  Recent CTA chest, abdomen and pelvis 08/27/2022, CT chest and upper abdomen without contrast 06/12/2022. FINDINGS: Lower chest: Stable cardiomegaly, today with an asymmetric right chamber predominance. Three-vessel calcific CAD. Infusion catheter again terminates at the superior cavoatrial junction and a small pericardial effusion again collecting to the right  anteriorly. Still seen are a moderate sized left and small right layering pleural effusions with compressive atelectasis or consolidation in the basal segments of the left lower lobe. Thin walled posterior basal right lower lobe air cyst is again noted. There previously was interstitial edema in the lungs which is not seen today. The pulmonary veins are no longer dilated. There are calcified right hilar nodes. Hepatobiliary: The gallbladder is increasingly distended up to 12 cm in length. Small stones layer in the proximal lumen. No wall thickening or bile duct dilatation. There are calcified granulomas in the liver but no mass enhancement. Pancreas: No mass, ductal dilatation or inflammatory change. Spleen: Again measuring mildly prominent 14.7 cm AP, capsular contraction again noted over a small hypodensity in the superior spleen which most likely either represents a subacute to late stage infarct or resolving infectious process, and was considerably larger on 06/12/2022. Scattered calcified granulomas with no other focal abnormality. Adrenals/Urinary Tract: Chronic nodular thickening both adrenal glands is unchanged. There are renovascular calcifications. Mild global volume loss of both kidneys is again shown. No stone or urinary obstruction is seen. There is a new wedge-shaped hypoenhancement in the upper to midpole right kidney laterally (series 4 axial 40 and 41 and coronal reconstruction image 78 of series 7). This is most likely a renal infarct, alternatively could be related to pyelonephritis but there are no adjacent inflammatory changes. The remainder of the kidneys enhance homogeneously. There is faint contrast in the bladder. Mild bladder thickening and perivesical haziness compatible with cystitis continues to be seen. There is no  bladder mass. Stomach/Bowel: Contracted stomach. No small bowel obstruction or inflammation is seen. The appendix contains contrast and is normal caliber. There or continued  mesenteric congestive changes in the mid to lower abdomen and pelvis. There are colonic diverticula greatest in the sigmoid without a focal diverticulitis. Severe circumferential rectal thickening continues to be seen with adjacent stranding and has been seen on previous studies as well warranting direct visualization to exclude underlying lesion. Vascular/Lymphatic: Aortoiliac and extensive visceral branch vessel atherosclerosis. No AAA. No adenopathy. Reproductive: Status post hysterectomy. No adnexal masses. Other: Small amount of posterior pelvic ascites is again noted, trace fluid in the pericolic gutters and scattered within the mesenteric folds. Body wall anasarca noted moderately and appears similar. No free air, free hemorrhage or abscess. Musculoskeletal: Osteopenia and degenerative skeletal changes. No primary destructive or pathologic process. IMPRESSION: 1. New wedge-shaped hypoenhancement in the upper to midpole right kidney laterally, most likely a renal infarct, less likely pyelonephritis. 2. Increasingly distended gallbladder with cholelithiasis but no wall thickening or bile duct dilatation. 3. Likely cystitis, seen previously. 4. Cardiomegaly with asymmetric right chamber predominance. Stable small pericardial effusion. 5. Moderate left and small right layering pleural effusions with compressive atelectasis or consolidation left lower lobe. Interstitial edema is no longer seen in the visualized lungs. 6. Stable small pericardial effusion. 7. Stable mild splenomegaly with capsular contraction over a small hypodensity in the superior spleen which most likely either represents a subacute to late stage infarct or resolving infectious process. 8. Continued severe circumferential rectal thickening with adjacent stranding. This could be due to proctitis or neoplasm. Direct visualization recommended unless already done. 9. Anasarca, mesenteric congestive changes, small amount of ascites similar to 3 days  ago. 10. Diverticulosis without diverticulitis. 11. Aortic and coronary artery atherosclerosis. Aortic Atherosclerosis (ICD10-I70.0). Electronically Signed   By: Almira Bar M.D.   On: 08/30/2022 20:52     Labs: Lab Results  Component Value Date   HGBA1C 7.9 (H) 08/27/2022   HGBA1C 8.9 (H) 05/02/2022   HGBA1C >15.5 (H) 01/20/2022   CRP 0.7 08/22/2020   CRP 1.5 (H) 03/13/2020   CRP 1.9 (H) 03/12/2020   LABURIC 4.7 08/09/2020   LABURIC 9.1 (H) 10/12/2018   LABURIC 9.0 (H) 03/06/2018   REPTSTATUS 08/30/2022 FINAL 08/29/2022   GRAMSTAIN  05/05/2022    FEW WBC PRESENT, PREDOMINANTLY PMN NO ORGANISMS SEEN    CULT MULTIPLE SPECIES PRESENT, SUGGEST RECOLLECTION (A) 08/29/2022   LABORGA SERRATIA MARCESCENS 05/05/2022     Lab Results  Component Value Date   ALBUMIN 2.4 (L) 08/31/2022   ALBUMIN 2.4 (L) 08/30/2022   ALBUMIN 2.6 (L) 08/29/2022   PREALBUMIN 13 (L) 05/02/2022    Lab Results  Component Value Date   MG 1.7 08/31/2022   MG 1.7 08/30/2022   MG 2.5 (H) 06/06/2022   Lab Results  Component Value Date   VD25OH 42.14 05/02/2022    Lab Results  Component Value Date   PREALBUMIN 13 (L) 05/02/2022      Latest Ref Rng & Units 08/31/2022    3:14 AM 08/30/2022    1:58 AM 08/29/2022    9:00 AM  CBC EXTENDED  WBC 4.0 - 10.5 K/uL 3.9  5.0  5.6   RBC 3.87 - 5.11 MIL/uL 3.93  4.04  4.08   Hemoglobin 12.0 - 15.0 g/dL 9.2  9.4  9.4   HCT 97.8 - 46.0 % 31.3  32.1  33.0   Platelets 150 - 400 K/uL 170  168  181  NEUT# 1.7 - 7.7 K/uL 2.1  3.5    Lymph# 0.7 - 4.0 K/uL 0.8  0.7       There is no height or weight on file to calculate BMI.  Orders:  No orders of the defined types were placed in this encounter.  No orders of the defined types were placed in this encounter.    Procedures: No procedures performed  Clinical Data: No additional findings.  ROS:  All other systems negative, except as noted in the HPI. Review of Systems  Objective: Vital Signs: There  were no vitals taken for this visit.  Specialty Comments:  No specialty comments available.  PMFS History: Patient Active Problem List   Diagnosis Date Noted   Right sided abdominal pain 08/31/2022   Constipation 06/07/2022   History of Clostridioides difficile colitis 06/06/2022   Below-knee amputation of right lower extremity 06/06/2022   Diverticulitis 06/05/2022   Stercoral colitis 06/05/2022   C. difficile colitis 06/05/2022   Spleen hematoma 06/05/2022   Dehiscence of amputation stump of right lower extremity 06/05/2022   Rectal ulcer 05/27/2022   ESRD on dialysis 05/27/2022   GI bleed 05/23/2022   Difficult intravenous access 05/23/2022   Gangrene of right foot 05/02/2022   S/P BKA (below knee amputation) unilateral, right 05/02/2022   Unspecified protein-calorie malnutrition 04/15/2022   Secondary hyperparathyroidism of renal origin 04/14/2022   Coagulation defect, unspecified 04/09/2022   Acquired absence of other left toe(s) 04/07/2022   Allergy, unspecified, initial encounter 04/07/2022   Dependence on renal dialysis 04/07/2022   Gout due to renal impairment, unspecified site 04/07/2022   Hypertensive heart and chronic kidney disease with heart failure and with stage 5 chronic kidney disease, or end stage renal disease 04/07/2022   Personal history of transient ischemic attack (TIA), and cerebral infarction without residual deficits 04/07/2022   Renal osteodystrophy 04/07/2022   Venous stasis ulcer of right calf 03/31/2022   Fistula, colovaginal 03/26/2022   Diarrhea 03/26/2022   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   Sepsis without acute organ dysfunction    Bacteremia    Acute pancreatitis 02/01/2022   Abdominal pain 02/01/2022   SIRS (systemic inflammatory response syndrome) 02/01/2022   Transaminitis 02/01/2022   History of anemia due to chronic kidney disease 02/01/2022   Paroxysmal atrial fibrillation 02/01/2022   DKA (diabetic ketoacidosis) 01/14/2022    NSTEMI (non-ST elevated myocardial infarction) 03/05/2021   Acute renal failure superimposed on stage 4 chronic kidney disease 08/22/2020   Hypoalbuminemia 05/25/2020   GERD (gastroesophageal reflux disease) 05/25/2020   Pressure injury of skin 05/17/2020   Type 2 diabetes mellitus with diabetic polyneuropathy, with long-term current use of insulin 03/07/2020   Obesity, Class III, BMI 40-49.9 (morbid obesity) 03/07/2020   Common bile duct (CBD) obstruction 05/28/2019   Benign neoplasm of ascending colon    Benign neoplasm of transverse colon    Benign neoplasm of descending colon    Benign neoplasm of sigmoid colon    Gastric polyps    Hyperkalemia 03/11/2019   Prolonged QT interval 03/11/2019   Acute blood loss anemia 03/11/2019   Onychomycosis 06/21/2018   Osteomyelitis of second toe of right foot    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    PVD (peripheral vascular disease) 10/26/2017   E-coli UTI 07/27/2017   Hypothyroidism 07/27/2017   AKI (acute kidney injury)    PAH (pulmonary artery hypertension)    Impaired ambulation 07/19/2017   Nausea & vomiting 07/15/2017   Leg cramps 02/27/2017  Peripheral edema 01/12/2017   Diabetic neuropathy 11/12/2016   CKD (chronic kidney disease), stage IV 10/24/2015   Anemia of chronic disease 10/03/2015   Generalized anxiety disorder 10/03/2015   Insomnia 10/03/2015   Hyperglycemia due to diabetes mellitus 06/07/2015   Chronic diastolic CHF (congestive heart failure) 06/07/2015   Non compliance with medical treatment 04/17/2014   Rotator cuff tear 03/14/2014   Class 3 obesity 09/23/2013   Chronic HFrEF (heart failure with reduced ejection fraction) 06/03/2013   Hypotension 12/25/2012   Urinary incontinence    MDD (major depressive disorder) 11/12/2010   RBBB (right bundle branch block)    Coronary artery disease    Hyperlipemia 01/22/2009   Essential hypertension 01/22/2009   Past Medical History:  Diagnosis  Date   Acute MI 1999; 2007; 03/05/21   Anemia    hx   Anginal pain    Anxiety    ARF (acute renal failure) 06/2017   [facility]   Arthritis    "generalized" (03/15/2014)   CAD (coronary artery disease)    MI in 2000 - MI  2007 - treated bare metal stent (no nuclear since then as 9/11)   Carotid artery disease    CHF (congestive heart failure)    HFrEF 03/06/21   Chronic diastolic heart failure    a) ECHO (08/2013) EF 55-60% and RV function nl b) RHC (08/2013) RA 4, RV 30/5/7, PA 25/10 (16), PCWP 7, Fick CO/CI 6.3/2.7, PVR 1.5 WU, PA 61 and 66%   Daily headache    "~ every other day; since I fell in June" (03/15/2014)   Depression    Dyslipidemia    Dyspnea    uses oxygen qhs via [place]   ESRD (end stage renal disease)    Dialysis on Tues Thurs Sat   Exertional shortness of breath    History of kidney stones    HTN (hypertension)    Hypothyroidism    Neuropathy    Obesity    Osteoarthritis    PAF (paroxysmal atrial fibrillation)    Peripheral neuropathy    bilateral feet/hands   PONV (postoperative nausea and vomiting)    RBBB (right bundle branch block)    Old   Stroke    mini strokes   Syncope    likely due to low blood sugar   Tachycardia    Sinus tachycardia   Type II diabetes mellitus    Type II, [name]. patient has omnipod insulin pump with Novolin R Insulin   Urinary incontinence    Venous insufficiency     Family History  Problem Relation Age of Onset   Heart attack Mother 30    Past Surgical History:  Procedure Laterality Date   ABDOMINAL HYSTERECTOMY  1980's   AMPUTATION Right 02/24/2018   Procedure: RIGHT FOOT GREAT TOE AND 2ND TOE AMPUTATION;  Surgeon: Nadara Mustard, MD;  Location: [facility] OR;  Service: Orthopedics;  Laterality: Right;   AMPUTATION Right 04/30/2018   Procedure: RIGHT TRANSMETATARSAL AMPUTATION;  Surgeon: Nadara Mustard, MD;  Location: [facility] OR;  Service: Orthopedics;  Laterality: Right;   AMPUTATION Right  05/02/2022   Procedure: RIGHT BELOW KNEE AMPUTATION;  Surgeon: Nadara Mustard, MD;  Location: [facility] OR;  Service: Orthopedics;  Laterality: Right;   APPLICATION OF WOUND VAC Right 06/13/2022   Procedure: APPLICATION OF WOUND VAC;  Surgeon: Nadara Mustard, MD;  Location: [facility] OR;  Service: Orthopedics;  Laterality: Right;   AV FISTULA PLACEMENT Left 04/02/2022  Procedure: LEFT ARM ARTERIOVENOUS (AV) FISTULA CREATION;  Surgeon: Nada Libman, MD;  Location: [facility] OR;  Service: Vascular;  Laterality: Left;  PERIPHERAL NERVE BLOCK   BASCILIC VEIN TRANSPOSITION Left 07/31/2022   Procedure: LEFT ARM SECOND STAGE BASILIC VEIN TRANSPOSITION;  Surgeon: Nada Libman, MD;  Location: [facility] OR;  Service: Vascular;  Laterality: Left;   BIOPSY  05/27/2020   Procedure: BIOPSY;  Surgeon: Lanelle Bal, DO;  Location: [facility];  Service: Endoscopy;;   CATARACT EXTRACTION, BILATERAL Bilateral ?2013   COLONOSCOPY W/ POLYPECTOMY     COLONOSCOPY WITH PROPOFOL N/A 03/13/2019   Procedure: COLONOSCOPY WITH PROPOFOL;  Surgeon: Beverley Fiedler, MD;  Location: [facility] ENDOSCOPY;  Service: Gastroenterology;  Laterality: N/A;   CORONARY ANGIOPLASTY WITH STENT PLACEMENT  1999; 2007   "1 + 1"   ERCP N/A 02/03/2022   Procedure: ENDOSCOPIC RETROGRADE CHOLANGIOPANCREATOGRAPHY (ERCP);  Surgeon: Jeani Hawking, MD;  Location: [facility] ENDOSCOPY;  Service: Gastroenterology;  Laterality: N/A;   ESOPHAGOGASTRODUODENOSCOPY (EGD) WITH PROPOFOL N/A 03/13/2019   Procedure: ESOPHAGOGASTRODUODENOSCOPY (EGD) WITH PROPOFOL;  Surgeon: Beverley Fiedler, MD;  Location: [facility] ENDOSCOPY;  Service: Gastroenterology;  Laterality: N/A;   ESOPHAGOGASTRODUODENOSCOPY (EGD) WITH PROPOFOL N/A 05/27/2020   Procedure: ESOPHAGOGASTRODUODENOSCOPY (EGD) WITH PROPOFOL;  Surgeon: Lanelle Bal, DO;  [facility];  Service: Endoscopy;  Laterality: N/A;   EYE SURGERY Bilateral    lazer   FLEXIBLE SIGMOIDOSCOPY N/A 05/23/2022   Procedure: FLEXIBLE SIGMOIDOSCOPY;   Surgeon: Jeani Hawking, MD;  Location: [facility] ENDOSCOPY;  Service: Gastroenterology;  Laterality: N/A;   FLEXIBLE SIGMOIDOSCOPY N/A 05/24/2022   Procedure: FLEXIBLE SIGMOIDOSCOPY;  Surgeon: Imogene Burn, MD;  Location: [facility] ENDOSCOPY;  Service: Gastroenterology;  Laterality: N/A;   HEMOSTASIS CLIP PLACEMENT  03/13/2019   Procedure: HEMOSTASIS CLIP PLACEMENT;  Surgeon: Beverley Fiedler, MD;  Location: [facility] ENDOSCOPY;  Service: Gastroenterology;;   HEMOSTASIS CLIP PLACEMENT  05/23/2022   Procedure: HEMOSTASIS CLIP PLACEMENT;  Surgeon: Jeani Hawking, MD;  Location: [facility] ENDOSCOPY;  Service: Gastroenterology;;   HEMOSTASIS CONTROL  05/24/2022   Procedure: HEMOSTASIS CONTROL;  Surgeon: Imogene Burn, MD;  Location: [facility] ENDOSCOPY;  Service: Gastroenterology;;   HOT HEMOSTASIS N/A 05/23/2022   Procedure: HOT HEMOSTASIS (ARGON PLASMA COAGULATION/BICAP);  Surgeon: Jeani Hawking, MD;  Location: [facility] ENDOSCOPY;  Service: Gastroenterology;  Laterality: N/A;   I & D EXTREMITY Left 05/05/2022   Procedure: IRRIGATION AND DEBRIDEMENT LEFT ARM AV FISTULA;  Surgeon: Cephus Shelling, MD;  Location: [facility] OR;  Service: Vascular;  Laterality: Left;   INSERTION OF DIALYSIS CATHETER Right 04/02/2022   Procedure: INSERTION OF TUNNELED DIALYSIS CATHETER;  Surgeon: Nada Libman, MD;  Location: [facility] OR;  Service: Vascular;  Laterality: Right;   KNEE ARTHROSCOPY Left 10/25/2006   POLYPECTOMY  03/13/2019   Procedure: POLYPECTOMY;  Surgeon: Beverley Fiedler, MD;  Location: [facility] ENDOSCOPY;  Service: Gastroenterology;;   REMOVAL OF STONES  02/03/2022   Procedure: REMOVAL OF STONES;  Surgeon: Jeani Hawking, MD;  Location: [facility] ENDOSCOPY;  Service: Gastroenterology;;   REVISON OF ARTERIOVENOUS FISTULA Left 08/20/2022   Procedure: REVISON OF LEFT ARM ARTERIOVENOUS FISTULA;  Surgeon: Nada Libman, MD;  Location: [facility] OR;  Service: Vascular;  Laterality: Left;   RIGHT HEART CATH N/A 07/24/2017   Procedure: RIGHT HEART CATH;  Surgeon: Dolores Patty, MD;  Location: [facility] INVASIVE CV LAB;  Service: Cardiovascular;  Laterality: N/A;   RIGHT HEART CATHETERIZATION N/A 09/22/2013   Procedure: RIGHT HEART CATH;  Surgeon: Bevelyn Buckles Bensimhon,  MD;  Location: [facility] CATH LAB;  Service: Cardiovascular;  Laterality: N/A;   SHOULDER ARTHROSCOPY WITH OPEN ROTATOR CUFF REPAIR Right 03/14/2014   Procedure: RIGHT SHOULDER ARTHROSCOPY WITH BICEPS RELEASE, OPEN SUBSCAPULA REPAIR, OPEN SUPRASPINATUS REPAIR.;  Surgeon: Cammy Copa, MD;  Location: [facility] OR;  Service: Orthopedics;  Laterality: Right;   SPHINCTEROTOMY  02/03/2022   Procedure: SPHINCTEROTOMY;  Surgeon: Jeani Hawking, MD;  Location: [facility] ENDOSCOPY;  Service: Gastroenterology;;   STUMP REVISION Right 06/13/2022   Procedure: REVISION RIGHT BELOW KNEE AMPUTATION;  Surgeon: Nadara Mustard, MD;  Location: [facility] OR;  Service: Orthopedics;  Laterality: Right;   TEE WITHOUT CARDIOVERSION N/A 02/04/2022   Procedure: TRANSESOPHAGEAL ECHOCARDIOGRAM (TEE);  Surgeon: Dolores Patty, MD;  Location: [facility] ENDOSCOPY;  Service: Cardiovascular;  Laterality: N/A;   THROMBECTOMY W/ EMBOLECTOMY Left 08/20/2022   Procedure: THROMBECTOMY OF LEFT ARM ARTERIOVENOUS FISTULA;  Surgeon: Nada Libman, MD;  Location: [facility] OR;  Service: Vascular;  Laterality: Left;   TOE AMPUTATION Right 02/24/2018   GREAT TOE AND 2ND TOE AMPUTATION   TUBAL LIGATION  1970's   Social History   Occupational History    Employer: UNEMPLOYED  Tobacco Use   Smoking status: Former    Packs/day: 3.00    Years: 32.00    Additional pack years: 0.00    Total pack years: 96.00    Types: Cigarettes    Quit date: 10/24/1997    Years since quitting: 24.8   Smokeless tobacco: Never  Vaping Use   Vaping Use: Never used  Substance and Sexual Activity   Alcohol use: Not Currently    Comment: "might have 2-3 daiquiris in the summer"   Drug use: No   Sexual activity: Not Currently    Birth control/protection: Surgical    Comment: Hysterectomy

## 2022-08-31 NOTE — Progress Notes (Addendum)
Gastroenterology Inpatient Follow Up    Subjective: States that her nausea has worsened again.  She has not had any overt vomiting but feels like she could dry heave.  Right-sided abdominal pain is still present.  She is also still having diarrhea  Objective: Vital signs in last 24 hours: Temp:  [97.8 F (36.6 C)-98.3 F (36.8 C)] 98.3 F (36.8 C) (04/07 1000) Pulse Rate:  [97-108] 108 (04/07 1000) Resp:  [14-18] 18 (04/07 1000) BP: (110-141)/(48-60) 141/48 (04/07 1000) SpO2:  [100 %] 100 % (04/07 1000) Weight:  [89 kg] 89 kg (04/06 2056) Last BM Date : 08/28/22  Intake/Output from previous day: 04/06 0701 - 04/07 0700 In: 4303.7 [P.O.:240; I.V.:68.7; IV Piggyback:3995] Out: 0  Intake/Output this shift: Total I/O In: 900 [P.O.:900] Out: 1 [Stool:1]  General appearance: alert and cooperative Resp: no increased WOB Cardio: regular rate GI: mildly tender in the right side of the abdomen  Lab Results: Recent Labs    08/29/22 0900 08/30/22 0158 08/31/22 0314  WBC 5.6 5.0 3.9*  HGB 9.4* 9.4* 9.2*  HCT 33.0* 32.1* 31.3*  PLT 181 168 170   BMET Recent Labs    08/29/22 0900 08/30/22 0158 08/31/22 0314  NA 134* 132* 128*  K 3.0* 2.9* 3.2*  CL 93* 97* 97*  CO2 GLUCOSE 220* 118* 55*  BUN 27* 13 20  CREATININE 3.69* 2.56* 3.27*  CALCIUM 8.7* 8.1* 7.9*   LFT Recent Labs    08/31/22 0314  PROT 5.2*  ALBUMIN 2.4*  AST 15  ALT 12  ALKPHOS 111  BILITOT 0.6   PT/INR No results for input(s): "LABPROT", "INR" in the last 72 hours. Hepatitis Panel No results for input(s): "HEPBSAG", "HCVAB", "HEPAIGM", "HEPBIGM" in the last 72 hours. C-Diff No results for input(s): "CDIFFTOX" in the last 72 hours.  Studies/Results: CT ABDOMEN PELVIS W CONTRAST  Result Date: 08/30/2022 CLINICAL DATA:  Abdominal pain. EXAM: CT ABDOMEN AND PELVIS WITH CONTRAST TECHNIQUE: Multidetector CT imaging of the abdomen and pelvis was performed using the standard protocol  following bolus administration of intravenous contrast. RADIATION DOSE REDUCTION: This exam was performed according to the departmental dose-optimization program which includes automated exposure control, adjustment of the mA and/or kV according to patient size and/or use of iterative reconstruction technique. CONTRAST:  75mL OMNIPAQUE IOHEXOL 350 MG/ML SOLN COMPARISON:  Recent CTA chest, abdomen and pelvis 08/27/2022, CT chest and upper abdomen without contrast 06/12/2022. FINDINGS: Lower chest: Stable cardiomegaly, today with an asymmetric right chamber predominance. Three-vessel calcific CAD. Infusion catheter again terminates at the superior cavoatrial junction and a small pericardial effusion again collecting to the right anteriorly. Still seen are a moderate sized left and small right layering pleural effusions with compressive atelectasis or consolidation in the basal segments of the left lower lobe. Thin walled posterior basal right lower lobe air cyst is again noted. There previously was interstitial edema in the lungs which is not seen today. The pulmonary veins are no longer dilated. There are calcified right hilar nodes. Hepatobiliary: The gallbladder is increasingly distended up to 12 cm in length. Small stones layer in the proximal lumen. No wall thickening or bile duct dilatation. There are calcified granulomas in the liver but no mass enhancement. Pancreas: No mass, ductal dilatation or inflammatory change. Spleen: Again measuring mildly prominent 14.7 cm AP, capsular contraction again noted over a small hypodensity in the superior spleen which most likely either represents a subacute to late stage infarct or resolving infectious  process, and was considerably larger on 06/12/2022. Scattered calcified granulomas with no other focal abnormality. Adrenals/Urinary Tract: Chronic nodular thickening both adrenal glands is unchanged. There are renovascular calcifications. Mild global volume loss of both  kidneys is again shown. No stone or urinary obstruction is seen. There is a new wedge-shaped hypoenhancement in the upper to midpole right kidney laterally (series 4 axial 40 and 41 and coronal reconstruction image 78 of series 7). This is most likely a renal infarct, alternatively could be related to pyelonephritis but there are no adjacent inflammatory changes. The remainder of the kidneys enhance homogeneously. There is faint contrast in the bladder. Mild bladder thickening and perivesical haziness compatible with cystitis continues to be seen. There is no bladder mass. Stomach/Bowel: Contracted stomach. No small bowel obstruction or inflammation is seen. The appendix contains contrast and is normal caliber. There or continued mesenteric congestive changes in the mid to lower abdomen and pelvis. There are colonic diverticula greatest in the sigmoid without a focal diverticulitis. Severe circumferential rectal thickening continues to be seen with adjacent stranding and has been seen on previous studies as well warranting direct visualization to exclude underlying lesion. Vascular/Lymphatic: Aortoiliac and extensive visceral branch vessel atherosclerosis. No AAA. No adenopathy. Reproductive: Status post hysterectomy. No adnexal masses. Other: Small amount of posterior pelvic ascites is again noted, trace fluid in the pericolic gutters and scattered within the mesenteric folds. Body wall anasarca noted moderately and appears similar. No free air, free hemorrhage or abscess. Musculoskeletal: Osteopenia and degenerative skeletal changes. No primary destructive or pathologic process. IMPRESSION: 1. New wedge-shaped hypoenhancement in the upper to midpole right kidney laterally, most likely a renal infarct, less likely pyelonephritis. 2. Increasingly distended gallbladder with cholelithiasis but no wall thickening or bile duct dilatation. 3. Likely cystitis, seen previously. 4. Cardiomegaly with asymmetric right chamber  predominance. Stable small pericardial effusion. 5. Moderate left and small right layering pleural effusions with compressive atelectasis or consolidation left lower lobe. Interstitial edema is no longer seen in the visualized lungs. 6. Stable small pericardial effusion. 7. Stable mild splenomegaly with capsular contraction over a small hypodensity in the superior spleen which most likely either represents a subacute to late stage infarct or resolving infectious process. 8. Continued severe circumferential rectal thickening with adjacent stranding. This could be due to proctitis or neoplasm. Direct visualization recommended unless already done. 9. Anasarca, mesenteric congestive changes, small amount of ascites similar to 3 days ago. 10. Diverticulosis without diverticulitis. 11. Aortic and coronary artery atherosclerosis. Aortic Atherosclerosis (ICD10-I70.0). Electronically Signed   By: Almira Bar M.D.   On: 08/30/2022 20:52   ECHOCARDIOGRAM COMPLETE  Result Date: 08/30/2022    ECHOCARDIOGRAM REPORT   Patient Name:   CESLEY BRACCIA Date of Exam: 08/30/2022 Medical Rec #:  [id number]    Height:       66.0 in Accession #:    [id number]   Weight:       196.4 lb Date of Birth:  17-Nov-1950    BSA:          1.985 m Patient Age:    72 years     BP:           114/61 mmHg Patient Gender: F            HR:           103 bpm. Exam Location:  Inpatient Procedure: 2D Echo, Cardiac Doppler, Color Doppler and Intracardiac            Opacification  Agent Indications:    Other abnormalities of the heart R00.8  History:        Patient has prior history of Echocardiogram examinations, most                 recent 02/02/2022. CHF, CAD and Previous Myocardial Infarction,                 Stroke, Arrythmias:Tachycardia and RBBB, Signs/Symptoms:Syncope                 and Shortness of Breath; Risk Factors:Former Smoker,                 Dyslipidemia and Hypertension.  Sonographer:    Aron Baba Referring Phys: [phone] A CALDWELL POWELL JR   Sonographer Comments: Suboptimal apical window. Image acquisition challenging due to patient body habitus and Image acquisition challenging due to respiratory motion. IMPRESSIONS  1. Global hypokinesis with apical akinesis; overall moderate to severe LV dysfunction.  2. Left ventricular ejection fraction, by estimation, is 30 to 35%. The left ventricle has moderate to severely decreased function. The left ventricle demonstrates regional wall motion abnormalities (see scoring diagram/findings for description). There is mild left ventricular hypertrophy. Left ventricular diastolic parameters are indeterminate. Elevated left atrial pressure.  3. Right ventricular systolic function is mildly reduced. The right ventricular size is moderately enlarged. There is normal pulmonary artery systolic pressure.  4. Left atrial size was moderately dilated.  5. Right atrial size was mildly dilated.  6. Moderate pleural effusion in the left lateral region.  7. The mitral valve is normal in structure. No evidence of mitral valve regurgitation. No evidence of mitral stenosis.  8. Tricuspid valve regurgitation is moderate.  9. The aortic valve is normal in structure. Aortic valve regurgitation is not visualized. Aortic valve sclerosis/calcification is present, without any evidence of aortic stenosis. 10. The inferior vena cava is normal in size with greater than 50% respiratory variability, suggesting right atrial pressure of 3 mmHg. FINDINGS  Left Ventricle: Left ventricular ejection fraction, by estimation, is 30 to 35%. The left ventricle has moderate to severely decreased function. The left ventricle demonstrates regional wall motion abnormalities. Definity contrast agent was given IV to delineate the left ventricular endocardial borders. The left ventricular internal cavity size was normal in size. There is mild left ventricular hypertrophy. Left ventricular diastolic parameters are indeterminate. Elevated left atrial pressure.  Right Ventricle: The right ventricular size is moderately enlarged. Right ventricular systolic function is mildly reduced. There is normal pulmonary artery systolic pressure. The tricuspid regurgitant velocity is 2.58 m/s, and with an assumed right atrial pressure of 8 mmHg, the estimated right ventricular systolic pressure is 34.6 mmHg. Left Atrium: Left atrial size was moderately dilated. Right Atrium: Right atrial size was mildly dilated. Pericardium: There is no evidence of pericardial effusion. Mitral Valve: The mitral valve is normal in structure. Mild mitral annular calcification. No evidence of mitral valve regurgitation. No evidence of mitral valve stenosis. Tricuspid Valve: The tricuspid valve is normal in structure. Tricuspid valve regurgitation is moderate . No evidence of tricuspid stenosis. Aortic Valve: The aortic valve is normal in structure. Aortic valve regurgitation is not visualized. Aortic valve sclerosis/calcification is present, without any evidence of aortic stenosis. Pulmonic Valve: The pulmonic valve was normal in structure. Pulmonic valve regurgitation is not visualized. No evidence of pulmonic stenosis. Aorta: The aortic root is normal in size and structure. Venous: The inferior vena cava is normal in size with greater than 50% respiratory variability,  suggesting right atrial pressure of 3 mmHg. IAS/Shunts: No atrial level shunt detected by color flow Doppler. Additional Comments: Global hypokinesis with apical akinesis; overall moderate to severe LV dysfunction. There is a moderate pleural effusion in the left lateral region.  LEFT VENTRICLE PLAX 2D LVIDd:         5.10 cm   Diastology LVIDs:         4.10 cm   LV e' medial:    2.68 cm/s LV PW:         1.30 cm   LV E/e' medial:  51.9 LV IVS:        1.00 cm   LV e' lateral:   10.80 cm/s LVOT diam:     1.70 cm   LV E/e' lateral: 12.9 LV SV:         39 LV SV Index:   19 LVOT Area:     2.27 cm  RIGHT VENTRICLE RV S prime:     2.92 cm/s TAPSE  (M-mode): 1.0 cm LEFT ATRIUM             Index        RIGHT ATRIUM           Index LA diam:        3.60 cm 1.81 cm/m   RA Area:     22.70 cm LA Vol (A2C):   97.5 ml 49.13 ml/m  RA Volume:   66.70 ml  33.61 ml/m LA Vol (A4C):   69.3 ml 34.92 ml/m LA Biplane Vol: 83.3 ml 41.97 ml/m  AORTIC VALVE             PULMONIC VALVE LVOT Vmax:   105.00 cm/s PR End Diast Vel: 3.44 msec LVOT Vmean:  68.700 cm/s LVOT VTI:    0.170 m  AORTA Ao Root diam: 3.40 cm Ao Asc diam:  3.20 cm MITRAL VALVE                TRICUSPID VALVE MV Area (PHT): 5.62 cm     TR Peak grad:   26.6 mmHg MV Decel Time: 135 msec     TR Vmax:        258.00 cm/s MV E velocity: 139.00 cm/s MV A velocity: 36.90 cm/s   SHUNTS MV E/A ratio:  3.77         Systemic VTI:  0.17 m                             Systemic Diam: 1.70 cm Olga Millers MD Electronically signed by Olga Millers MD Signature Date/Time: 08/30/2022/2:36:28 PM    Final     Medications: I have reviewed the patient's current medications. Scheduled:  Chlorhexidine Gluconate Cloth  6 each Topical Q0600   dextrose       insulin aspart  0-5 Units Subcutaneous QHS   insulin aspart  0-6 Units Subcutaneous TID WC   insulin glargine-yfgn  5 Units Subcutaneous Q supper   levothyroxine  75 mcg Oral QAC breakfast   midodrine  5 mg Oral TID WC   pantoprazole  40 mg Oral Daily   pregabalin  75 mg Oral Daily   sevelamer carbonate  1,600 mg Oral TID WC   sodium chloride flush  3 mL Intravenous Q12H   vortioxetine HBr  20 mg Oral Daily   Continuous:  cefTRIAXone (ROCEPHIN)  IV 2 g (08/30/22 2101)   heparin 1,100 Units/hr (08/30/22 2355)  metronidazole 500 mg (08/31/22 1142)   promethazine (PHENERGAN) injection (IM or IVPB) 6.25 mg (08/30/22 1128)   ZOX:[id number]:acetaminophen **OR** acetaminophen, dextrose, HYDROmorphone (DILAUDID) injection, LORazepam, LORazepam, prochlorperazine, promethazine **OR** promethazine (PHENERGAN) injection (IM or IVPB) **OR**  promethazine  Assessment/Plan: 72 year old female with history of rectal ulcer in 04/2022, HFrEF (EF 30-35%), ESRD on HD, CAD, DM, A-fib presented with nausea, vomiting, abdominal pain, and diarrhea.  She had had a negative infectious stool workup.  C dif antigen was positive but PCR and toxin were negative.  Right upper quadrant ultrasound showed a distended gallbladder with small stones and trace pericholecystic fluid as well as CBD dilatation.  HIDA scan was normal.  LFTs are normal.  CT shows some signs of proctitis, which is favored to represent bowel wall edema related to her severe heart failure.  Patient had a recent flex sig in 04/2022 that showed rectal ulcers.  Repeat CT shows a new wedge-shaped hypoenhancement in the right kidney suspected to be a renal infarct, less likely pyelonephritis.  Perhaps the patient's renal infarct could be a source of her abdominal pain. For further evaluation of the patient's diarrhea, will plan to check a fecal elastase and fecal calprotectin.  Will check a CRP as well.  -Continue supportive care for now -Check fecal elastase and fecal calprotectin -Check CRP -Increased PPI from QD to BID to see if this helps with the patient's nausea -Patient was restarted on anticoagulation with IV heparin gtt (previously held due to prior rectal ulcer bleed) due to finding of splenic infarct and renal infarct on CTs -Patient is on antibiotics for UTI -Dr. Loreta AveMann to take over care of the patient tomorrow. Could consider EGD and/or flex sig in the future.   LOS: 4 days   Imogene BurnYing C Magdelyn Roebuck 08/31/2022, 3:34 PM

## 2022-08-31 NOTE — Progress Notes (Signed)
ANTICOAGULATION CONSULT NOTE - Follow Up Consult  Pharmacy Consult for Heparin Indication:  Afib with new splenic infarct, and potential renal infarct  Allergies  Allergen Reactions   Cephalexin Diarrhea and Other (See Comments)   Codeine Nausea And Vomiting and Other (See Comments)    Patient Measurements: Height: 5\' 6"  (167.6 cm) Weight: 89 kg (196 lb 3.4 oz) IBW/kg (Calculated) : 59.3 Heparin Dosing Weight: 80 kg  Vital Signs: Temp: 98.3 F (36.8 C) (04/07 1649) BP: 102/49 (04/07 1649) Pulse Rate: 94 (04/07 1649)  Labs: Recent Labs    08/29/22 0900 08/30/22 0158 08/31/22 0314 08/31/22 0739 08/31/22 1810  HGB 9.4* 9.4* 9.2*  --   --   HCT 33.0* 32.1* 31.3*  --   --   PLT 181 168 170  --   --   HEPARINUNFRC  --   --   --  <0.10* <0.10*  CREATININE 3.69* 2.56* 3.27*  --   --     ESRD -  usual MWF HD  Assessment: Patient presenting with abdominal pain, nausea, and vomiting. Patient has Afib without current anticoagulation - on apixaban previously but held due to rectal ulcers in 12/23. CT abdomen noted new splenic infarct as well as potential renal infarct. GI provider would like to anticoagulate to prevent further infarcts. Heparin 5000 units SQ q8h stopped after am dose 4/6.  Will need close monitoring of labs and for signs of bleeding due to risk of bleed.   Heparin continues at 1100 units/hr with resulting undetectable level.  No further IV issues noted.   Will increase infusion rate.  Goal of Therapy:  Heparin level 0.3-0.7 units/ml, but will target lower end of range Monitor platelets by anticoagulation protocol: Yes   Plan:  Increase heparin to 1350 units/hr. Heparin level in ~8 hrs. Daily heparin level and CBC. Monitor for signs/symptoms of bleeding.   [employer], Pharm.D., BCPS Clinical Pharmacist  **Pharmacist phone directory can be found on [email or web address] listed under [facility].  08/31/2022 8:06 PM'

## 2022-08-31 NOTE — Progress Notes (Signed)
[facility] Progress Note   Subjective: Seen in room. Continued abd pain in right side, nausea a little better. Repeat CT showed multiple infarcts to spleen, kidney. Heparin gtt started.    Objective Vitals:   08/30/22 0839 08/30/22 1631 08/30/22 2056 08/31/22 0410  BP: 114/61 (!) 110/59 (!) 124/58 122/60  Pulse: (!) 101 98 97 99  Resp: 15 14 18 16   Temp: (!) 97.5 F (36.4 C) 97.8 F (36.6 C) 97.8 F (36.6 C) 97.9 F (36.6 C)  TempSrc: Oral Oral Oral Oral  SpO2: 100% 100% 100% 100%  Weight:   89 kg   Height:        89 kg  Additional Objective Labs: Basic Metabolic Panel: Recent Labs  Lab 08/27/22 1313 08/28/22 0253 08/29/22 0900 08/30/22 0158 08/31/22 0314  NA 131*   < > 134* 132* 128*  K 2.9*   < > 3.0* 2.9* 3.2*  CL 93*   < > 93* 97* 97*  CO2 25   < > 23 24 25   GLUCOSE 302*   < > 220* 118* 55*  BUN 32*   < > 27* 13 20  CREATININE 3.52*   < > 3.69* 2.56* 3.27*  CALCIUM 8.8*   < > 8.7* 8.1* 7.9*  PHOS 5.5*  --   --  4.1 4.2   < > = values in this interval not displayed.    CBC: Recent Labs  Lab 08/26/22 2247 08/27/22 1053 08/27/22 1313 08/28/22 0253 08/29/22 0900 08/30/22 0158 08/31/22 0314  WBC 9.2   < > 7.5 8.2 5.6 5.0 3.9*  NEUTROABS 8.1*  --   --   --   --  3.5 2.1  HGB 10.3*   < > 9.6* 10.1* 9.4* 9.4* 9.2*  HCT 35.3*   < > 32.8* 34.8* 33.0* 32.1* 31.3*  MCV 79.1*   < > 79.4* 78.9* 80.9 79.5* 79.6*  PLT 100*   < > 223 188 181 168 170   < > = values in this interval not displayed.    Blood Culture    Component Value Date/Time   SDES URINE, RANDOM 08/29/2022 0528   SPECREQUEST  08/29/2022 0528    NONE Reflexed from [id number] Performed at [facility], [address] [place], [place] [address]    CULT MULTIPLE SPECIES PRESENT, SUGGEST RECOLLECTION (A) 08/29/2022 0528   REPTSTATUS 08/30/2022 FINAL 08/29/2022 0528     Physical Exam General: Chronically ill appearing  Heart: Tachy, regular Lungs: Clear  bilaterally Abdomen: soft no masses Extremities: R BKA Trace LE edema  Dialysis Access: R IJ TDC ; L AVF maturing  Medications:  cefTRIAXone (ROCEPHIN)  IV 2 g (08/30/22 2101)   heparin 1,100 Units/hr (08/30/22 2355)   metronidazole 500 mg (08/31/22 0523)   promethazine (PHENERGAN) injection (IM or IVPB) 6.25 mg (08/30/22 1128)    Chlorhexidine Gluconate Cloth  6 each Topical Q0600   dextrose       insulin aspart  0-5 Units Subcutaneous QHS   insulin aspart  0-6 Units Subcutaneous TID WC   insulin glargine-yfgn  5 Units Subcutaneous Q supper   levothyroxine  75 mcg Oral QAC breakfast   midodrine  5 mg Oral TID WC   pantoprazole  40 mg Oral Daily   pregabalin  75 mg Oral Daily   sevelamer carbonate  1,600 mg Oral TID WC   sodium chloride flush  3 mL Intravenous Q12H   vortioxetine HBr  20 mg Oral Daily  Dialysis Orders:  MWF SW  4h  400/800  93kg  2/2 bath  RIJ TDC    Heparin 4000 + - last HD 4/1, post wt 93kg - venofer 50mg  IV weekly - mircera 225 mcg IV q 2 wks, last 3/26, due 4/09  Assessment/Plan: Abdominal pain / vomiting/ diarrhea - CT with proctitis. HIDA scan negative. On Abx for proctitis. GI consulted -no further intervention planned. Repeat CT with mulitple infarcts to spleen, kidney. Heparin gtt started. Per primary  ESRD - on HD MWF.  Next HD Monday.  HTN/ volume - BP controlled. sig LE edema which is moderate. Below EDW by weights here. UF as able.  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  next esa due on 4/09. Follow.  MBD ckd - CCa and phos are in range. Cont binder (renvela) ac tid. Not getting vdra's.  PAF - per pmd PAD - sp R BKA DM2 - on insulin  Susan Blase PA-C [facility] 08/31/2022,9:31 AM

## 2022-09-01 ENCOUNTER — Inpatient Hospital Stay (HOSPITAL_COMMUNITY): Payer: PPO

## 2022-09-01 DIAGNOSIS — N186 End stage renal disease: Secondary | ICD-10-CM

## 2022-09-01 DIAGNOSIS — I5022 Chronic systolic (congestive) heart failure: Secondary | ICD-10-CM

## 2022-09-01 DIAGNOSIS — D6869 Other thrombophilia: Secondary | ICD-10-CM | POA: Diagnosis not present

## 2022-09-01 DIAGNOSIS — I484 Atypical atrial flutter: Secondary | ICD-10-CM | POA: Diagnosis not present

## 2022-09-01 DIAGNOSIS — R000 Tachycardia, unspecified: Secondary | ICD-10-CM

## 2022-09-01 DIAGNOSIS — R109 Unspecified abdominal pain: Secondary | ICD-10-CM | POA: Diagnosis not present

## 2022-09-01 DIAGNOSIS — Z992 Dependence on renal dialysis: Secondary | ICD-10-CM

## 2022-09-01 DIAGNOSIS — I2510 Atherosclerotic heart disease of native coronary artery without angina pectoris: Secondary | ICD-10-CM

## 2022-09-01 LAB — COMPREHENSIVE METABOLIC PANEL
ALT: 10 U/L (ref 0–44)
AST: 12 U/L — ABNORMAL LOW (ref 15–41)
Albumin: 2.2 g/dL — ABNORMAL LOW (ref 3.5–5.0)
Alkaline Phosphatase: 102 U/L (ref 38–126)
Anion gap: 7 (ref 5–15)
BUN: 24 mg/dL — ABNORMAL HIGH (ref 8–23)
CO2: 23 mmol/L (ref 22–32)
Calcium: 7.8 mg/dL — ABNORMAL LOW (ref 8.9–10.3)
Chloride: 97 mmol/L — ABNORMAL LOW (ref 98–111)
Creatinine, Ser: 4.59 mg/dL — ABNORMAL HIGH (ref 0.44–1.00)
GFR, Estimated: 10 mL/min — ABNORMAL LOW (ref >60)
Glucose, Bld: 338 mg/dL — ABNORMAL HIGH (ref 70–99)
Potassium: 3.7 mmol/L (ref 3.5–5.1)
Sodium: 127 mmol/L — ABNORMAL LOW (ref 135–145)
Total Bilirubin: 0.5 mg/dL (ref 0.3–1.2)
Total Protein: 5.1 g/dL — ABNORMAL LOW (ref 6.5–8.1)

## 2022-09-01 LAB — CBC WITH DIFFERENTIAL/PLATELET
Abs Immature Granulocytes: 0.01 10*3/uL (ref 0.00–0.07)
Basophils Absolute: 0.1 10*3/uL (ref 0.0–0.1)
Basophils Relative: 1 %
Eosinophils Absolute: 0.2 10*3/uL (ref 0.0–0.5)
Eosinophils Relative: 4 %
HCT: 30.6 % — ABNORMAL LOW (ref 36.0–46.0)
Hemoglobin: 8.7 g/dL — ABNORMAL LOW (ref 12.0–15.0)
Immature Granulocytes: 0 %
Lymphocytes Relative: 21 %
Lymphs Abs: 0.8 10*3/uL (ref 0.7–4.0)
MCH: 23.2 pg — ABNORMAL LOW (ref 26.0–34.0)
MCHC: 28.4 g/dL — ABNORMAL LOW (ref 30.0–36.0)
MCV: 81.6 fL (ref 80.0–100.0)
Monocytes Absolute: 0.6 10*3/uL (ref 0.1–1.0)
Monocytes Relative: 16 %
Neutro Abs: 2.2 10*3/uL (ref 1.7–7.7)
Neutrophils Relative %: 58 %
Platelets: 160 10*3/uL (ref 150–400)
RBC: 3.75 MIL/uL — ABNORMAL LOW (ref 3.87–5.11)
RDW: 19.9 % — ABNORMAL HIGH (ref 11.5–15.5)
WBC: 3.8 10*3/uL — ABNORMAL LOW (ref 4.0–10.5)
nRBC: 0 % (ref 0.0–0.2)

## 2022-09-01 LAB — MAGNESIUM: Magnesium: 1.9 mg/dL (ref 1.7–2.4)

## 2022-09-01 LAB — C-REACTIVE PROTEIN: CRP: 0.6 mg/dL (ref <1.0)

## 2022-09-01 LAB — HEPARIN LEVEL (UNFRACTIONATED)
Heparin Unfractionated: 0.2 IU/mL — ABNORMAL LOW (ref 0.30–0.70)
Heparin Unfractionated: 0.15 IU/mL — ABNORMAL LOW (ref 0.30–0.70)

## 2022-09-01 LAB — GLUCOSE, CAPILLARY
Glucose-Capillary: 301 mg/dL — ABNORMAL HIGH (ref 70–99)
Glucose-Capillary: 325 mg/dL — ABNORMAL HIGH (ref 70–99)
Glucose-Capillary: 325 mg/dL — ABNORMAL HIGH (ref 70–99)
Glucose-Capillary: 371 mg/dL — ABNORMAL HIGH (ref 70–99)
Glucose-Capillary: 382 mg/dL — ABNORMAL HIGH (ref 70–99)

## 2022-09-01 LAB — PHOSPHORUS: Phosphorus: 5.1 mg/dL — ABNORMAL HIGH (ref 2.5–4.6)

## 2022-09-01 MED ORDER — AMIODARONE HCL IN DEXTROSE 360-4.14 MG/200ML-% IV SOLN
30.0000 mg/h | INTRAVENOUS | Status: DC
  Administered 2022-09-02 – 2022-09-04 (×4): 30 mg/h via INTRAVENOUS
  Filled 2022-09-01 (×5): qty 200

## 2022-09-01 MED ORDER — METOPROLOL TARTRATE 25 MG PO TABS: 25.0000 mg | ORAL_TABLET | Freq: Two times a day (BID) | ORAL | Status: DC

## 2022-09-01 MED ORDER — ANTICOAGULANT SODIUM CITRATE 4% (200MG/5ML) IV SOLN: 5.0000 mL | Status: DC | PRN

## 2022-09-01 MED ORDER — ATORVASTATIN CALCIUM 80 MG PO TABS
80.0000 mg | ORAL_TABLET | Freq: Every day | ORAL | Status: DC
  Administered 2022-09-02 – 2022-09-05 (×5): 80 mg via ORAL
  Filled 2022-09-01 (×5): qty 1

## 2022-09-01 MED ORDER — METOPROLOL TARTRATE 5 MG/5ML IV SOLN
5.0000 mg | INTRAVENOUS | Status: DC | PRN
  Filled 2022-09-01: qty 5

## 2022-09-01 MED ORDER — HEPARIN BOLUS VIA INFUSION
2000.0000 [IU] | Freq: Once | INTRAVENOUS | Status: AC
  Administered 2022-09-01: 2000 [IU] via INTRAVENOUS
  Filled 2022-09-01: qty 2000

## 2022-09-01 MED ORDER — AMIODARONE HCL 200 MG PO TABS: 200.0000 mg | ORAL_TABLET | Freq: Every day | ORAL | Status: DC

## 2022-09-01 MED ORDER — HEPARIN SODIUM (PORCINE) 1000 UNIT/ML DIALYSIS: 1000.0000 [IU] | INTRAMUSCULAR | Status: DC | PRN

## 2022-09-01 MED ORDER — LIDOCAINE HCL (PF) 1 % IJ SOLN: 5.0000 mL | INTRAMUSCULAR | Status: DC | PRN

## 2022-09-01 MED ORDER — METOPROLOL TARTRATE 5 MG/5ML IV SOLN
5.0000 mg | INTRAVENOUS | Status: DC | PRN
  Administered 2022-09-01: 5 mg via INTRAVENOUS

## 2022-09-01 MED ORDER — AMIODARONE LOAD VIA INFUSION
150.0000 mg | Freq: Once | INTRAVENOUS | Status: AC
  Administered 2022-09-01: 150 mg via INTRAVENOUS
  Filled 2022-09-01: qty 83.34

## 2022-09-01 MED ORDER — PENTAFLUOROPROP-TETRAFLUOROETH EX AERO: 1.0000 | INHALATION_SPRAY | CUTANEOUS | Status: DC | PRN

## 2022-09-01 MED ORDER — AMIODARONE HCL IN DEXTROSE 360-4.14 MG/200ML-% IV SOLN
60.0000 mg/h | INTRAVENOUS | Status: AC
  Administered 2022-09-01: 60 mg/h via INTRAVENOUS
  Filled 2022-09-01: qty 200

## 2022-09-01 MED ORDER — HEPARIN SODIUM (PORCINE) 1000 UNIT/ML DIALYSIS
4000.0000 [IU] | Freq: Once | INTRAMUSCULAR | Status: AC
  Administered 2022-09-01: 4000 [IU] via INTRAVENOUS_CENTRAL
  Filled 2022-09-01: qty 4

## 2022-09-01 MED ORDER — ALTEPLASE 2 MG IJ SOLR: 2.0000 mg | Freq: Once | INTRAMUSCULAR | Status: DC | PRN

## 2022-09-01 MED ORDER — INSULIN GLARGINE-YFGN 100 UNIT/ML ~~LOC~~ SOLN
15.0000 [IU] | Freq: Every day | SUBCUTANEOUS | Status: DC
  Administered 2022-09-01: 15 [IU] via SUBCUTANEOUS
  Filled 2022-09-01 (×3): qty 0.15

## 2022-09-01 MED ORDER — LIDOCAINE-PRILOCAINE 2.5-2.5 % EX CREA: 1.0000 | TOPICAL_CREAM | CUTANEOUS | Status: DC | PRN

## 2022-09-01 MED ORDER — AMIODARONE IV BOLUS ONLY 150 MG/100ML
150.0000 mg | INTRAVENOUS | Status: DC | PRN
  Filled 2022-09-01: qty 100

## 2022-09-01 NOTE — Progress Notes (Signed)
Pt receives out-pt HD at [facility] on MWF. Will assist as needed.   Danyel Griess Renal Navigator [phone] 

## 2022-09-01 NOTE — Progress Notes (Signed)
ANTICOAGULATION CONSULT NOTE - Follow Up Consult  Pharmacy Consult for Heparin Indication:  Afib with new splenic infarct, and potential renal infarct  Allergies  Allergen Reactions   Cephalexin Diarrhea and Other (See Comments)   Codeine Nausea And Vomiting and Other (See Comments)    Patient Measurements: Height: 5\' 6"  (167.6 cm) Weight: 89 kg (196 lb 3.4 oz) IBW/kg (Calculated) : 59.3 Heparin Dosing Weight: 81.8 kg  Vital Signs:    Labs: Recent Labs    08/30/22 0158 08/31/22 0314 08/31/22 0739 08/31/22 1810 09/01/22 0428  HGB 9.4* 9.2*  --   --  8.7*  HCT 32.1* 31.3*  --   --  30.6*  PLT 168 170  --   --  160  HEPARINUNFRC  --   --  <0.10* <0.10* 0.20*  CREATININE 2.56* 3.27*  --   --  4.59*    ESRD -  usual MWF HD  Assessment: Patient presenting with abdominal pain, nausea, and vomiting. Patient has Afib without current anticoagulation - on apixaban previously but held due to rectal ulcers in 12/23. CT abdomen noted new splenic infarct as well as potential renal infarct. GI provider would like to anticoagulate to prevent further infarcts. Heparin 5000 units SQ q8h stopped after am dose 4/6. Will need close monitoring of labs and for signs of bleeding due to risk of bleed.   Goal of Therapy:  Heparin level 0.3-0.7 units/ml, but will target lower end of range Monitor platelets by anticoagulation protocol: Yes  4/8 AM Update Heparin level subtherapeutic at 0.2 with rate of 1350units/hr Hgb 9.2>>8.7  Plan: Increase heparin infusion rate to 1500units/hr Recheck 8 hour heparin level Continue to monitor for signs/symptoms of bleeding  Fredirick Lathe, Student Pharmacist

## 2022-09-01 NOTE — Progress Notes (Addendum)
Subjective: Patient gives a history of having some nausea while trying to have lunch today and the feeling of being somewhat faint which prompted a rapid report response evaluation when she was found to have V. tach.  She claims she had some pain in her abdomen going down to her groin but that has since resolved. She received some IV metoprolol and her symptoms improved and she converted to sinus rhythm. She denies having any problem with nausea or diarrhea since then.  She has had regular bowel movement today.  She had a CT scan recently that showed renal infarct with gallbladder distention along with cholelithiasis suspected cystitis cardiomegaly with asymmetric right chamber predominance, small pleural effusion along with l moderate left and small right layering effusions subacute to late stage clinic in fact was a resolving infection and severe circumferential rectal tingling lingual adjacent stranding direct visualization was recommended but due to the patient's recent cardiac issues this has been held off.   Objective: Vital signs in last 24 hours: Temp:  [98.3 F (36.8 C)-98.8 F (37.1 C)] 98.3 F (36.8 C) (04/08 1548) Pulse Rate:  [88-95] 88 (04/08 1548) Resp:  [15-21] 17 (04/08 1615) BP: (98-139)/(39-118) 132/70 (04/08 1615) SpO2:  [93 %-100 %] 100 % (04/08 1548) Last BM Date : 09/01/22  Intake/Output from previous day: 04/07 0701 - 04/08 0700 In: 1666.1 [P.O.:1200; I.V.:211.5; IV Piggyback:254.6] Out: 2 [Stool:2] Intake/Output this shift: Total I/O In: 300 [P.O.:300] Out: -   General appearance: alert, cooperative, appears stated age, fatigued, mild distress, and moderately obese Resp: clear to auscultation bilaterally Cardio: regular rate and rhythm, S1, S2 normal, no murmur, click, rub or gallop GI: soft, non-tender; bowel sounds normal; no masses,  no organomegaly  Lab Results: Recent Labs    08/30/22 0158 08/31/22 0314 09/01/22 0428  WBC 5.0 3.9* 3.8*  HGB 9.4* 9.2*  8.7*  HCT 32.1* 31.3* 30.6*  PLT 168 170 160   BMET Recent Labs    08/30/22 0158 08/31/22 0314 09/01/22 0428  NA 132* 128* 127*  K 2.9* 3.2* 3.7  CL 97* 97* 97*  CO2 24 25 23   GLUCOSE 118* 55* 338*  BUN 13 20 24*  CREATININE 2.56* 3.27* 4.59*  CALCIUM 8.1* 7.9* 7.8*   LFT Recent Labs    09/01/22 0428  PROT 5.1*  ALBUMIN 2.2*  AST 12*  ALT 10  ALKPHOS 102  BILITOT 0.5    Medications: I have reviewed the patient's current medications. Prior to Admission:  Medications Prior to Admission  Medication Sig Dispense Refill Last Dose   albuterol (VENTOLIN HFA) 108 (90 Base) MCG/ACT inhaler Inhale 1-2 puffs into the lungs every 6 (six) hours as needed for wheezing or shortness of breath.   UNKNOWN   amiodarone (PACERONE) 200 MG tablet Take 1 tablet (200 mg total) by mouth daily. 90 tablet 3 08/26/2022 at 0900   ascorbic acid (VITAMIN C) 500 MG tablet Take 500 mg by mouth daily.   08/26/2022 at am   aspirin EC 81 MG tablet Take 1 tablet (81 mg total) by mouth daily with breakfast. 30 tablet 11 08/26/2022 at am   diphenoxylate-atropine (LOMOTIL) 2.5-0.025 MG tablet Take 1 tablet by mouth 4 (four) times daily as needed for diarrhea or loose stools. 30 tablet 0 UNKNOWN   ferrous sulfate 325 (65 FE) MG tablet Take 325 mg by mouth 2 (two) times daily. (1000 & 2100)   08/26/2022 at pm   folic acid (FOLVITE) 1 MG tablet Take 1 mg by  mouth daily.   08/26/2022 at am   hydrALAZINE (APRESOLINE) 25 MG tablet Take 1 tablet (25 mg total) by mouth every 6 (six) hours as needed (SBP>150 or DBP>100). 90 tablet 1 UNKNOWN   hydrocortisone (ANUSOL-HC) 2.5 % rectal cream Place 1 Application rectally 2 (two) times daily. (1000 & 2100)   08/26/2022 at pm   insulin aspart (NOVOLOG FLEXPEN) 100 UNIT/ML FlexPen Inject 0-15 Units into the skin See admin instructions. Inject 12 units subcutaneously 3 times daily with meals (0900, 1300 & 1700) & as needed via sliding scale insulin CBG <70=Notify NP/MD 71-149=0  units,150-199=4 units, 200-249=6 units,250- 299=10 units, 300-349=12 units, 350-399=15 units, > 400 call MD ( prime pen with 2 units prior  to set dose)   08/26/2022 at 1630   insulin glargine (LANTUS SOLOSTAR) 100 UNIT/ML Solostar Pen Inject 40 Units into the skin at bedtime. (Patient taking differently: Inject 20 Units into the skin at bedtime.) 15 mL 11 08/25/2022 at pm   levothyroxine (SYNTHROID) 75 MCG tablet Take 75 mcg by mouth daily before breakfast.   08/26/2022 at am   linagliptin (TRADJENTA) 5 MG TABS tablet Take 1 tablet (5 mg total) by mouth daily. 30 tablet 0 08/26/2022 at am   LORazepam (ATIVAN) 0.5 MG tablet Take 1 tablet (0.5 mg total) by mouth every 8 (eight) hours as needed for anxiety. 30 tablet 0 08/25/2022   midodrine (PROAMATINE) 10 MG tablet Take 10 mg by mouth every Monday, Wednesday, and Friday with hemodialysis.   08/25/2022 at 0900   midodrine (PROAMATINE) 5 MG tablet Take 5 mg by mouth See admin instructions. Take 1 tablet (5 mg) by mouth with meals on Tuesdays, Thursdays, Saturdays & Sundays.   08/26/2022 at 1700   Multiple Vitamin (DAILY VITE) TABS Take 1 tablet by mouth daily.   08/26/2022 at am   Nutritional Supplements (FEEDING SUPPLEMENT, NEPRO CARB STEADY,) LIQD Take 237 mLs by mouth daily. (0900)   08/26/2022 at am   ondansetron (ZOFRAN) 4 MG tablet Take 4 mg by mouth every 8 (eight) hours as needed for nausea.   08/26/2022 at 1128   oxyCODONE-acetaminophen (PERCOCET/ROXICET) 5-325 MG tablet Take 1 tablet by mouth every 6 (six) hours as needed. 20 tablet 0 UNKNOWN   OXYGEN Inhale 2 L/min into the lungs at bedtime.   08/25/2022   pantoprazole (PROTONIX) 40 MG tablet Take 1 tablet (40 mg total) by mouth daily. 30 tablet 1 08/26/2022 at am   polyethylene glycol (MIRALAX / GLYCOLAX) 17 g packet Take 17 g by mouth 2 (two) times daily. (Patient taking differently: Take 17 g by mouth daily as needed (constipation.).) 14 each 0 UNKNOWN   pregabalin (LYRICA) 75 MG capsule Take 1 capsule (75 mg  total) by mouth daily. 30 capsule 0 08/26/2022 at 0900   senna-docusate (SENOKOT-S) 8.6-50 MG tablet Take 2 tablets by mouth 2 (two) times daily.   08/26/2022 at pm   sevelamer carbonate (RENVELA) 800 MG tablet Take 1,600 mg by mouth 3 (three) times daily with meals. (0800, 1200 & 1700)   08/26/2022 at 1700   vortioxetine HBr (TRINTELLIX) 20 MG TABS tablet Take 1 tablet (20 mg total) by mouth in the morning. (1000) 30 tablet 5 08/26/2022 at 0900   Amino Acids-Protein Hydrolys (FEEDING SUPPLEMENT, PRO-STAT SUGAR FREE 64,) LIQD Take 30 mLs by mouth in the morning and at bedtime. (1000 & 1400) (Patient not taking: Reported on 08/27/2022)   Not Taking   blood glucose meter kit and supplies Use up to  four times daily as directed. 1 each 0    Continuous Blood Gluc Sensor (FREESTYLE LIBRE 2 SENSOR) MISC       Fingerstix Lancets MISC Use as directed to check blood sugars as need up to 4 times daily 100 each 0    fluconazole (DIFLUCAN) 150 MG tablet Take 150 mg by mouth every 3 (three) days. (Patient not taking: Reported on 08/27/2022)   Completed Course   Insulin Pen Needle 32G X 4 MM MISC Use as directed 200 each 0    Scheduled:  atorvastatin  80 mg Oral Daily   Chlorhexidine Gluconate Cloth  6 each Topical Q0600   heparin  4,000 Units Dialysis Once in dialysis   insulin aspart  0-5 Units Subcutaneous QHS   insulin aspart  0-6 Units Subcutaneous TID WC   insulin glargine-yfgn  15 Units Subcutaneous Q supper   levothyroxine  75 mcg Oral QAC breakfast   midodrine  5 mg Oral TID WC   pantoprazole  40 mg Oral BID   pregabalin  75 mg Oral Daily   sevelamer carbonate  1,600 mg Oral TID WC   sodium chloride flush  3 mL Intravenous Q12H   vortioxetine HBr  20 mg Oral Daily   Continuous:  amiodarone 60 mg/hr (09/01/22 1623)   amiodarone     anticoagulant sodium citrate     cefTRIAXone (ROCEPHIN)  IV 2 g (08/31/22 2041)   heparin 1,650 Units/hr (09/01/22 1557)   metronidazole 500 mg (09/01/22 1455)    promethazine (PHENERGAN) injection (IM or IVPB) 6.25 mg (08/30/22 1128)   ZOX:[id number]:acetaminophen **OR** acetaminophen, alteplase, anticoagulant sodium citrate, heparin, HYDROmorphone (DILAUDID) injection, lidocaine (PF), lidocaine-prilocaine, LORazepam, LORazepam, metoprolol tartrate, pentafluoroprop-tetrafluoroeth, prochlorperazine, promethazine **OR** promethazine (PHENERGAN) injection (IM or IVPB) **OR** promethazine  Assessment/Plan: 1) Abdominal pain with nausea-etiology unclear her symptoms seem to have improved today.  She had some nausea earlier today when she was trying to eat some fish during lunchtime but was diagnosed with V. tach that was treated with IV metoprolol. She had a negative infectious workup with stool studies done that showed C. difficile antigen that was positive for PCR but negative for toxin right upper quadrant showed diet distention of the gallbladder with small small stones and trace perisplenic cholecystic fluid as well as CBD dilatation HIDA was normal LFTs are normal there is some proctitis noted on CT scan due to bowel wall edema due to severe heart failure; a flexible sigmoidoscopy in December 2023 that showed rectal ulcers-stercoral colitis. Fecal elastase and calprotectin are pending.Marland Kitchen.  2) UTI-on antibiotic. 3) AODM. 4) HTN. 5) CAD/wide-complex tachycardia concerning for deep V. tach/acute combined CHF/paroxysmal atrial fibrillation with flutter/PAD/hypertension/dyslipidemia. 6) ESRD-on HD. 7) Acute on chronic anemia.  LOS: 5 days   Charna ElizabethJyothi Kolbi Altadonna 09/01/2022, 4:45 PM

## 2022-09-01 NOTE — Significant Event (Signed)
Rapid Response Event Note   Reason for Call :  "Vtach with pulse"  Initial Focused Assessment:  Patient in bed A&O, c/o pain under right breast radiating down--this pain has been intermittent but worse now. Also c/o being hot. Skin warm and dry to touch. Lungs clear. Heart tones fast and irregular. Heart rhythm on monitor and 12 lead differ--on monitor appear as vtach, on 12 lead wide complex QRS. MD to bedside to evaluate prior to meds given.   HR noted to be 180s prior to arrival. Call from CCMD, thought to initially be artifact as pt eating lunch and RN in room.   VS 120/105 (112) HR 149 RR 20 O2 93% RA  Interventions:  EKG pre and post metoprolol 5mg  IV metoprolol  MD C. Powell to bedside Cards Consult, to bedside Amio bolus 150mg  IV, followed by amio gtt  VS 139/51 (69) HR 91 RR 14 O2 94% RA  Plan of Care:  Transfer to PCU Call back for further needs.   Event Summary:  MD Notified: C. Powell Call Time: 1226 Arrival Time: 1229 End Time: 1400  Truddie Crumble, RN

## 2022-09-01 NOTE — Progress Notes (Signed)
ANTICOAGULATION CONSULT NOTE - Follow Up Consult  Pharmacy Consult for Heparin Indication:  Afib with new splenic infarct, and potential renal infarct  Allergies  Allergen Reactions   Cephalexin Diarrhea and Other (See Comments)   Codeine Nausea And Vomiting and Other (See Comments)    Patient Measurements: Height: 5\' 6"  (167.6 cm) Weight: 89 kg (196 lb 3.4 oz) IBW/kg (Calculated) : 59.3 Heparin Dosing Weight: 81.8 kg  Vital Signs: Temp: 98.8 F (37.1 C) (04/08 1228) Temp Source: Oral (04/08 1228) BP: 98/50 (04/08 1454) Pulse Rate: 90 (04/08 1454)  Labs: Recent Labs    08/30/22 0158 08/31/22 0314 08/31/22 0739 08/31/22 1810 09/01/22 0428 09/01/22 1356  HGB 9.4* 9.2*  --   --  8.7*  --   HCT 32.1* 31.3*  --   --  30.6*  --   PLT 168 170  --   --  160  --   HEPARINUNFRC  --   --    < > <0.10* 0.20* 0.15*  CREATININE 2.56* 3.27*  --   --  4.59*  --    < > = values in this interval not displayed.   ESRD -  usual MWF HD  Assessment: Patient with Afib but not on anticoagulation, now found to have subacute to late stage splenic infarct and potential renal infarct. She was previously on apixaban but held due to rectal ulcers in 12/23. No anticoagulation prior to admission. Pharmacy consulted for heparin.    Heparin level 0.15 is subtherapeutic on 1500 units/hr. Level decreased despite rate increase.  No issues with infusion or bleeding per RN. Will give small bolus and increase rate. Noted patient with Vtach with pulse episode 4/8.    Goal of Therapy:  Heparin level 0.3-0.7 units/ml, but will target lower end of range Monitor platelets by anticoagulation protocol: Yes    Plan: Heparin 2000 units x1 then Increase to 1650 units/hr Recheck 8 hour heparin level Monitor daily heparin level, CBC Monitor for signs/symptoms of bleeding   Alphia Moh, PharmD, BCPS, BCCP Clinical Pharmacist  Please check AMION for all [facility] Pharmacy phone numbers After 10:00 PM, call Main  Pharmacy [phone]

## 2022-09-01 NOTE — Progress Notes (Signed)
Report called to Vernona Rieger, RN on [address].  Waiting for room to be cleaned.

## 2022-09-01 NOTE — Care Management Important Message (Signed)
Important Message  Patient Details  Name: Susan [name] MRN: [id number] Date of Birth: 17-Nov-1950   Medicare Important Message Given:  Yes     Renie Ora 09/01/2022, 3:39 PM

## 2022-09-01 NOTE — Progress Notes (Addendum)
PROGRESS NOTE    Susan [name]  GNF:[id number] DOB: 05/29/1950 DOA: 08/26/2022 PCP: Donita BrooksPickard, Warren T, MD  Chief Complaint  Patient presents with   Abdominal Pain    Brief Narrative:   72 yo F with ESRD, diastolic HF, peripheral neuropathy, HTN, hypothyroidism, and T2DM presenting with nausea, vomiting, and abdominal pain. These symptoms developed after eating mac and cheese and ham leftover from Easter. Imaging notable for proctitis with mesenteric congestive changes in the lower abdomen and pelvis. Also with notable RUQ TTP, RUQ US shows distended gallbladder with small stones and trace pericholecystic fluid. Currently on antibiotics to cover proctocolitis.   Assessment & Plan:   Principal Problem:   Abdominal pain Active Problems:   Right sided abdominal pain  Addendum: Hx Afib  Wide Complex Tachycardia  Hold amiodarone with qtc prolongation Anticoagulation was previously hold due to hx rectal ulcer  Heparin gtt started Addendum: called for wide complex tachycardia.  Alert, no CP, but c/o feeling flush.  In the past has had wide complex rhythm, concern for VT vs aberrancy.  Given metop 5 mg x1, converted back to sinus.  Continue metop.  Start amiodarone bolus/gtt.  Transfer to progressive floor.  Consult cardiology.  Abdominal pain  Intractable Nausea CT with proctitis with mesenteric congestive changes in lower abdomen and pelvis Continuing abx for now to cover proctitis - notably, hx of bleeding rectal ulcer, oversewn 12/230 by general surgery Flex sig 12/30 with multiple ulcers in rectum  RUQ TTP concerning for cholecystitis, will follow HIDA -> negative Repeat CT scan shows suspected renal infarct, gallbladder distension with cholelithiasis, suspected cystitis, cardiomegaly with asymmetric R chamber predominance, small pericardial effusion, moderate L and small R layering effusions, subacute to late stage splenic infarct vs resolving infectious process, severe circumferential  rectal thickening with adjacent stranding GI path panel negative.  C diff PCR negative, antigen positive  Clear liquids for now - still having issues tolerating PO  Will consult gastroenterology due to persistent symptoms which haven't significantly improved with antibiotics, appreciate their assistance - they suspect proctitis and mesenteric congestion are stable - ap related to volume? Fecal calprotectin, pancreatic elastase per GI pending Direct visualization recommended of rectal thickening, will defer decision to GI (she did have flex sig 05/24/22) - still has significant suprapubic TTP, will discuss with GI  Antiemetics prn   Volume overload Body wall anasarca increased per imaging Generalized interstitial edema and L>R effusions Ground glass edema Volume per renal -> dialysis per renal    ESRD Per renal    Hyponatremia Suspect related to hypervolemia  Volume per renal   Hypokalemia Improved, follow   Imaging concerning for cystitis Follow urine culture -> multiple species    Hypodense abnormality in superomedial aspect of spleen  Splenic Infarct Evolving infarct vs burned out infectious process -> Multiple infarcts per my discussion with radiology -> suspect this is in the setting of atrial fibrillation?  Heparin gtt started  Renal Infarct Wedge shaped hypoenhancement in upper to midpole R kidney, likely renal infarct Anticoagulation as noted above - suspect due to fib  T2DM  Hypoglycemia Hyperglycemia, increase back to 15 units basal SSI Holding tradjenta   Thrombocytopenia Resolved  Prolonged Qtc Repeat EKG -> improved Qtc 442 Caution with qt prolonging meds Hold amio for now   Hypertension On midodrine with dialysis Has prn hydral? Would stop this at d/c  Hypothyroidism Synthroid    DVT prophylaxis: heparin Code Status: full Family Communication: none Disposition:   Status is: Inpatient Remains  inpatient appropriate because: need for  inpatient care   Consultants:  GI  Procedures:  none  Antimicrobials:  Anti-infectives (From admission, onward)    Start     Dose/Rate Route Frequency Ordered Stop   08/28/22 0400  metroNIDAZOLE (FLAGYL) IVPB 500 mg        500 mg 100 mL/hr over 60 Minutes Intravenous Every 8 hours 08/27/22 2048     08/27/22 2200  vancomycin (VANCOCIN) 50 mg/mL oral solution SOLN 125 mg  Status:  Discontinued        125 mg Oral Daily at bedtime 08/27/22 1946 08/27/22 2042   08/27/22 2200  vancomycin (VANCOCIN) capsule 125 mg  Status:  Discontinued        125 mg Oral Daily at bedtime 08/27/22 2042 08/27/22 2048   08/27/22 2045  cefTRIAXone (ROCEPHIN) 2 g in sodium chloride 0.9 % 100 mL IVPB        2 g 200 mL/hr over 30 Minutes Intravenous Every 24 hours 08/27/22 1949     08/27/22 1000  metroNIDAZOLE (FLAGYL) IVPB 500 mg  Status:  Discontinued        500 mg 100 mL/hr over 60 Minutes Intravenous Every 12 hours 08/27/22 0848 08/27/22 2048       Subjective: Feels better Still more pain than normal   Objective: Vitals:   08/30/22 2056 08/31/22 0410 08/31/22 1000 08/31/22 1649  BP: (!) 124/58 122/60 (!) 141/48 (!) 102/49  Pulse: 97 99 (!) 108 94  Resp: 18 16 18 17   Temp: 97.8 F (36.6 C) 97.9 F (36.6 C) 98.3 F (36.8 C) 98.3 F (36.8 C)  TempSrc: Oral Oral    SpO2: 100% 100% 100% 100%  Weight: 89 kg     Height:        Intake/Output Summary (Last 24 hours) at 09/01/2022 0827 Last data filed at 09/01/2022 0100 Gross per 24 hour  Intake 1066.06 ml  Output 2 ml  Net 1064.06 ml   Filed Weights   08/29/22 0900 08/29/22 1330 08/30/22 2056  Weight: 90.3 kg 89.1 kg 89 kg    Examination:  General: No acute distress. Eating breakfast. Cardiovascular: RRR Lungs: unlabored Abdomen: overall improved TTP, but still with relatively impressive TTP in suprapubic region Neurological: Alert and oriented 3. Moves all extremities 4 with equal strength. Cranial nerves II through XII grossly  intact. Extremities: R BKA   Data Reviewed: I have personally reviewed following labs and imaging studies  CBC: Recent Labs  Lab 08/26/22 2247 08/27/22 1053 08/28/22 0253 08/29/22 0900 08/30/22 0158 08/31/22 0314 09/01/22 0428  WBC 9.2   < > 8.2 5.6 5.0 3.9* 3.8*  NEUTROABS 8.1*  --   --   --  3.5 2.1 2.2  HGB 10.3*   < > 10.1* 9.4* 9.4* 9.2* 8.7*  HCT 35.3*   < > 34.8* 33.0* 32.1* 31.3* 30.6*  MCV 79.1*   < > 78.9* 80.9 79.5* 79.6* 81.6  PLT 100*   < > 188 181 168 170 160   < > = values in this interval not displayed.    Basic Metabolic Panel: Recent Labs  Lab 08/27/22 1313 08/28/22 0253 08/29/22 0900 08/30/22 0158 08/31/22 0314 09/01/22 0428  NA 131* 135 134* 132* 128* 127*  K 2.9* 3.6 3.0* 2.9* 3.2* 3.7  CL 93* 95* 93* 97* 97* 97*  CO2 25 23 23 24 25 23   GLUCOSE 302* 216* 220* 118* 55* 338*  BUN 32* 18 27* 13 20 24*  CREATININE 3.52* 2.54*  3.69* 2.56* 3.27* 4.59*  CALCIUM 8.8* 8.8* 8.7* 8.1* 7.9* 7.8*  MG  --   --   --  1.7 1.7 1.9  PHOS 5.5*  --   --  4.1 4.2 5.1*    GFR: Estimated Creatinine Clearance: 12.3 mL/min (Cedric Denison) (by C-G formula based on SCr of 4.59 mg/dL (H)).  Liver Function Tests: Recent Labs  Lab 08/28/22 0253 08/29/22 0900 08/30/22 0158 08/31/22 0314 09/01/22 0428  AST 17 14* 15 15 12*  ALT 15 14 13 12 10   ALKPHOS 144* 128* 112 111 102  BILITOT 0.8 0.9 0.7 0.6 0.5  PROT 6.3* 5.9* 5.6* 5.2* 5.1*  ALBUMIN 2.7* 2.6* 2.4* 2.4* 2.2*    CBG: Recent Labs  Lab 08/31/22 1128 08/31/22 1646 08/31/22 2011 09/01/22 0037 09/01/22 0733  GLUCAP 188* 189* 292* 301* 325*     Recent Results (from the past 240 hour(s))  Gastrointestinal Panel by PCR , Stool     Status: None   Collection Time: 08/27/22  8:45 AM   Specimen: STOOL  Result Value Ref Range Status   Campylobacter species NOT DETECTED NOT DETECTED Final   Plesimonas shigelloides NOT DETECTED NOT DETECTED Final   Salmonella species NOT DETECTED NOT DETECTED Final   Yersinia  enterocolitica NOT DETECTED NOT DETECTED Final   Vibrio species NOT DETECTED NOT DETECTED Final   Vibrio cholerae NOT DETECTED NOT DETECTED Final   Enteroaggregative E coli (EAEC) NOT DETECTED NOT DETECTED Final   Enteropathogenic E coli (EPEC) NOT DETECTED NOT DETECTED Final   Enterotoxigenic E coli (ETEC) NOT DETECTED NOT DETECTED Final   Shiga like toxin producing E coli (STEC) NOT DETECTED NOT DETECTED Final   Shigella/Enteroinvasive E coli (EIEC) NOT DETECTED NOT DETECTED Final   Cryptosporidium NOT DETECTED NOT DETECTED Final   Cyclospora cayetanensis NOT DETECTED NOT DETECTED Final   Entamoeba histolytica NOT DETECTED NOT DETECTED Final   Giardia lamblia NOT DETECTED NOT DETECTED Final   Adenovirus F40/41 NOT DETECTED NOT DETECTED Final   Astrovirus NOT DETECTED NOT DETECTED Final   Norovirus GI/GII NOT DETECTED NOT DETECTED Final   Rotavirus Isobelle Tuckett NOT DETECTED NOT DETECTED Final   Sapovirus (I, II, IV, and V) NOT DETECTED NOT DETECTED Final    Comment: Performed at [facility], [address] [place], [place] [address]  C Difficile Quick Screen w PCR reflex     Status: Abnormal   Collection Time: 08/27/22  8:45 AM   Specimen: STOOL  Result Value Ref Range Status   C Diff antigen POSITIVE (Cheril Slattery) NEGATIVE Final   C Diff toxin NEGATIVE NEGATIVE Final   C Diff interpretation Results are indeterminate. See PCR results.  Final    Comment: Performed at [facility], [address], [place], [place] [address]  C. Diff by PCR, Reflexed     Status: None   Collection Time: 08/27/22  8:45 AM  Result Value Ref Range Status   Toxigenic C. Difficile by PCR NEGATIVE NEGATIVE Final    Comment: Patient is colonized with non toxigenic C. difficile. May not need treatment unless significant symptoms are present. Performed at [facility], [address] [place], [place] [address]   Urine Culture     Status: Abnormal   Collection Time: 08/29/22  5:28 AM   Specimen: Urine,  Random  Result Value Ref Range Status   Specimen Description URINE, RANDOM  Final   Special Requests   Final    NONE Reflexed from [id number] Performed at  [facility], [name], [place], [place] [address]    Culture MULTIPLE SPECIES PRESENT, SUGGEST RECOLLECTION (Kwane Rohl)  Final   Report Status 08/30/2022 FINAL  Final         Radiology Studies: CT ABDOMEN PELVIS W CONTRAST  Result Date: 08/30/2022 CLINICAL DATA:  Abdominal pain. EXAM: CT ABDOMEN AND PELVIS WITH CONTRAST TECHNIQUE: Multidetector CT imaging of the abdomen and pelvis was performed using the standard protocol following bolus administration of intravenous contrast. RADIATION DOSE REDUCTION: This exam was performed according to the departmental dose-optimization program which includes automated exposure control, adjustment of the mA and/or kV according to patient size and/or use of iterative reconstruction technique. CONTRAST:  75mL OMNIPAQUE IOHEXOL 350 MG/ML SOLN COMPARISON:  Recent CTA chest, abdomen and pelvis 08/27/2022, CT chest and upper abdomen without contrast 06/12/2022. FINDINGS: Lower chest: Stable cardiomegaly, today with an asymmetric right chamber predominance. Three-vessel calcific CAD. Infusion catheter again terminates at the superior cavoatrial junction and Harvey Lingo small pericardial effusion again collecting to the right anteriorly. Still seen are Catalyna Reilly moderate sized left and small right layering pleural effusions with compressive atelectasis or consolidation in the basal segments of the left lower lobe. Thin walled posterior basal right lower lobe air cyst is again noted. There previously was interstitial edema in the lungs which is not seen today. The pulmonary veins are no longer dilated. There are calcified right hilar nodes. Hepatobiliary: The gallbladder is increasingly distended up to 12 cm in length. Small stones layer in the proximal lumen. No wall thickening or bile duct dilatation. There are calcified granulomas in  the liver but no mass enhancement. Pancreas: No mass, ductal dilatation or inflammatory change. Spleen: Again measuring mildly prominent 14.7 cm AP, capsular contraction again noted over Juma Oxley small hypodensity in the superior spleen which most likely either represents Cleland Simkins subacute to late stage infarct or resolving infectious process, and was considerably larger on 06/12/2022. Scattered calcified granulomas with no other focal abnormality. Adrenals/Urinary Tract: Chronic nodular thickening both adrenal glands is unchanged. There are renovascular calcifications. Mild global volume loss of both kidneys is again shown. No stone or urinary obstruction is seen. There is Yocheved Depner new wedge-shaped hypoenhancement in the upper to midpole right kidney laterally (series 4 axial 40 and 41 and coronal reconstruction image 78 of series 7). This is most likely Benjamen Koelling renal infarct, alternatively could be related to pyelonephritis but there are no adjacent inflammatory changes. The remainder of the kidneys enhance homogeneously. There is faint contrast in the bladder. Mild bladder thickening and perivesical haziness compatible with cystitis continues to be seen. There is no bladder mass. Stomach/Bowel: Contracted stomach. No small bowel obstruction or inflammation is seen. The appendix contains contrast and is normal caliber. There or continued mesenteric congestive changes in the mid to lower abdomen and pelvis. There are colonic diverticula greatest in the sigmoid without Johnanthony Wilden focal diverticulitis. Severe circumferential rectal thickening continues to be seen with adjacent stranding and has been seen on previous studies as well warranting direct visualization to exclude underlying lesion. Vascular/Lymphatic: Aortoiliac and extensive visceral branch vessel atherosclerosis. No AAA. No adenopathy. Reproductive: Status post hysterectomy. No adnexal masses. Other: Small amount of posterior pelvic ascites is again noted, trace fluid in the pericolic  gutters and scattered within the mesenteric folds. Body wall anasarca noted moderately and appears similar. No free air, free hemorrhage or abscess. Musculoskeletal: Osteopenia and degenerative skeletal changes. No primary destructive or pathologic process. IMPRESSION: 1. New wedge-shaped hypoenhancement in the upper to midpole right  kidney laterally, most likely Josalyn Dettmann renal infarct, less likely pyelonephritis. 2. Increasingly distended gallbladder with cholelithiasis but no wall thickening or bile duct dilatation. 3. Likely cystitis, seen previously. 4. Cardiomegaly with asymmetric right chamber predominance. Stable small pericardial effusion. 5. Moderate left and small right layering pleural effusions with compressive atelectasis or consolidation left lower lobe. Interstitial edema is no longer seen in the visualized lungs. 6. Stable small pericardial effusion. 7. Stable mild splenomegaly with capsular contraction over Lonna Rabold small hypodensity in the superior spleen which most likely either represents Cameo Shewell subacute to late stage infarct or resolving infectious process. 8. Continued severe circumferential rectal thickening with adjacent stranding. This could be due to proctitis or neoplasm. Direct visualization recommended unless already done. 9. Anasarca, mesenteric congestive changes, small amount of ascites similar to 3 days ago. 10. Diverticulosis without diverticulitis. 11. Aortic and coronary artery atherosclerosis. Aortic Atherosclerosis (ICD10-I70.0). Electronically Signed   By: Almira Bar M.D.   On: 08/30/2022 20:52   ECHOCARDIOGRAM COMPLETE  Result Date: 08/30/2022    ECHOCARDIOGRAM REPORT   Patient Name:   Susan [name] Date of Exam: 08/30/2022 Medical Rec #:  [id number]    Height:       66.0 in Accession #:    [id number]   Weight:       196.4 lb Date of Birth:  [birth year]    BSA:          1.985 m Patient Age:    72 years     BP:           114/61 mmHg Patient Gender: F            HR:           103 bpm. Exam  Location:  Inpatient Procedure: 2D Echo, Cardiac Doppler, Color Doppler and Intracardiac            Opacification Agent Indications:    Other abnormalities of the heart R00.8  History:        Patient has prior history of Echocardiogram examinations, most                 recent 02/02/2022. CHF, CAD and Previous Myocardial Infarction,                 Stroke, Arrythmias:Tachycardia and RBBB, Signs/Symptoms:Syncope                 and Shortness of Breath; Risk Factors:Former Smoker,                 Dyslipidemia and Hypertension.  Sonographer:    Aron Baba Referring Phys: [phone] Kree Rafter CALDWELL POWELL JR  Sonographer Comments: Suboptimal apical window. Image acquisition challenging due to patient body habitus and Image acquisition challenging due to respiratory motion. IMPRESSIONS  1. Global hypokinesis with apical akinesis; overall moderate to severe LV dysfunction.  2. Left ventricular ejection fraction, by estimation, is 30 to 35%. The left ventricle has moderate to severely decreased function. The left ventricle demonstrates regional wall motion abnormalities (see scoring diagram/findings for description). There is mild left ventricular hypertrophy. Left ventricular diastolic parameters are indeterminate. Elevated left atrial pressure.  3. Right ventricular systolic function is mildly reduced. The right ventricular size is moderately enlarged. There is normal pulmonary artery systolic pressure.  4. Left atrial size was moderately dilated.  5. Right atrial size was mildly dilated.  6. Moderate pleural effusion in the left lateral region.  7. The mitral valve is normal in structure. No evidence of mitral valve  regurgitation. No evidence of mitral stenosis.  8. Tricuspid valve regurgitation is moderate.  9. The aortic valve is normal in structure. Aortic valve regurgitation is not visualized. Aortic valve sclerosis/calcification is present, without any evidence of aortic stenosis. 10. The inferior vena cava is normal in  size with greater than 50% respiratory variability, suggesting right atrial pressure of 3 mmHg. FINDINGS  Left Ventricle: Left ventricular ejection fraction, by estimation, is 30 to 35%. The left ventricle has moderate to severely decreased function. The left ventricle demonstrates regional wall motion abnormalities. Definity contrast agent was given IV to delineate the left ventricular endocardial borders. The left ventricular internal cavity size was normal in size. There is mild left ventricular hypertrophy. Left ventricular diastolic parameters are indeterminate. Elevated left atrial pressure. Right Ventricle: The right ventricular size is moderately enlarged. Right ventricular systolic function is mildly reduced. There is normal pulmonary artery systolic pressure. The tricuspid regurgitant velocity is 2.58 m/s, and with an assumed right atrial pressure of 8 mmHg, the estimated right ventricular systolic pressure is 34.6 mmHg. Left Atrium: Left atrial size was moderately dilated. Right Atrium: Right atrial size was mildly dilated. Pericardium: There is no evidence of pericardial effusion. Mitral Valve: The mitral valve is normal in structure. Mild mitral annular calcification. No evidence of mitral valve regurgitation. No evidence of mitral valve stenosis. Tricuspid Valve: The tricuspid valve is normal in structure. Tricuspid valve regurgitation is moderate . No evidence of tricuspid stenosis. Aortic Valve: The aortic valve is normal in structure. Aortic valve regurgitation is not visualized. Aortic valve sclerosis/calcification is present, without any evidence of aortic stenosis. Pulmonic Valve: The pulmonic valve was normal in structure. Pulmonic valve regurgitation is not visualized. No evidence of pulmonic stenosis. Aorta: The aortic root is normal in size and structure. Venous: The inferior vena cava is normal in size with greater than 50% respiratory variability, suggesting right atrial pressure of 3 mmHg.  IAS/Shunts: No atrial level shunt detected by color flow Doppler. Additional Comments: Global hypokinesis with apical akinesis; overall moderate to severe LV dysfunction. There is Ayo Guarino moderate pleural effusion in the left lateral region.  LEFT VENTRICLE PLAX 2D LVIDd:         5.10 cm   Diastology LVIDs:         4.10 cm   LV e' medial:    2.68 cm/s LV PW:         1.30 cm   LV E/e' medial:  51.9 LV IVS:        1.00 cm   LV e' lateral:   10.80 cm/s LVOT diam:     1.70 cm   LV E/e' lateral: 12.9 LV SV:         39 LV SV Index:   19 LVOT Area:     2.27 cm  RIGHT VENTRICLE RV S prime:     2.92 cm/s TAPSE (M-mode): 1.0 cm LEFT ATRIUM             Index        RIGHT ATRIUM           Index LA diam:        3.60 cm 1.81 cm/m   RA Area:     22.70 cm LA Vol (A2C):   97.5 ml 49.13 ml/m  RA Volume:   66.70 ml  33.61 ml/m LA Vol (A4C):   69.3 ml 34.92 ml/m LA Biplane Vol: 83.3 ml 41.97 ml/m  AORTIC VALVE  PULMONIC VALVE LVOT Vmax:   105.00 cm/s PR End Diast Vel: 3.44 msec LVOT Vmean:  68.700 cm/s LVOT VTI:    0.170 m  AORTA Ao Root diam: 3.40 cm Ao Asc diam:  3.20 cm MITRAL VALVE                TRICUSPID VALVE MV Area (PHT): 5.62 cm     TR Peak grad:   26.6 mmHg MV Decel Time: 135 msec     TR Vmax:        258.00 cm/s MV E velocity: 139.00 cm/s MV Jaelynne Hockley velocity: 36.90 cm/s   SHUNTS MV E/Jehan Ranganathan ratio:  3.77         Systemic VTI:  0.17 m                             Systemic Diam: 1.70 cm Olga Millers MD Electronically signed by Olga Millers MD Signature Date/Time: 08/30/2022/2:36:28 PM    Final         Scheduled Meds:  Chlorhexidine Gluconate Cloth  6 each Topical Q0600   insulin aspart  0-5 Units Subcutaneous QHS   insulin aspart  0-6 Units Subcutaneous TID WC   insulin glargine-yfgn  5 Units Subcutaneous Q supper   levothyroxine  75 mcg Oral QAC breakfast   midodrine  5 mg Oral TID WC   pantoprazole  40 mg Oral BID   pregabalin  75 mg Oral Daily   sevelamer carbonate  1,600 mg Oral TID WC   sodium chloride  flush  3 mL Intravenous Q12H   vortioxetine HBr  20 mg Oral Daily   Continuous Infusions:  cefTRIAXone (ROCEPHIN)  IV 2 g (08/31/22 2041)   heparin 1,500 Units/hr (09/01/22 0811)   metronidazole 500 mg (09/01/22 0319)   promethazine (PHENERGAN) injection (IM or IVPB) 6.25 mg (08/30/22 1128)     LOS: 5 days    Time spent: over 30 min    Lacretia Nicks, MD [facility]   To contact the attending provider between 7A-7P or the covering provider during after hours 7P-7A, please log into the web site [email or web address] and access using universal [place] password for that web site. If you do not have the password, please call the hospital operator.  09/01/2022, 8:27 AM

## 2022-09-01 NOTE — Progress Notes (Signed)
[facility] Progress Note   Subjective:  Seen in room - still with abdominal pain and diarrhea, as well as dyspnea. No CP.  Objective Vitals:   08/31/22 0410 08/31/22 1000 08/31/22 1649 09/01/22 0928  BP: 122/60 (!) 141/48 (!) 102/49 (!) 123/50  Pulse: 99 (!) 108 94 95  Resp: 16 18 17 18   Temp: 97.9 F (36.6 C) 98.3 F (36.8 C) 98.3 F (36.8 C) 98.4 F (36.9 C)  TempSrc: Oral     SpO2: 100% 100% 100% 100%  Weight:      Height:       Physical Exam General: Chronically ill appearing woman, NAD. Nasal O2 in place. Heart: RRR; no murmur Lungs: CTA anteriorly Abdomen: mildly tender, but soft Extremities: R BKA, trace LLE edema; leg tender to palpation Dialysis Access: TDC in R chest, LUE AVF maturing + thrill  Additional Objective Labs: Basic Metabolic Panel: Recent Labs  Lab 08/30/22 0158 08/31/22 0314 09/01/22 0428  NA 132* 128* 127*  K 2.9* 3.2* 3.7  CL 97* 97* 97*  CO2 24 25 23   GLUCOSE 118* 55* 338*  BUN 13 20 24*  CREATININE 2.56* 3.27* 4.59*  CALCIUM 8.1* 7.9* 7.8*  PHOS 4.1 4.2 5.1*   Liver Function Tests: Recent Labs  Lab 08/30/22 0158 08/31/22 0314 09/01/22 0428  AST 15 15 12*  ALT 13 12 10   ALKPHOS 112 111 102  BILITOT 0.7 0.6 0.5  PROT 5.6* 5.2* 5.1*  ALBUMIN 2.4* 2.4* 2.2*   Recent Labs  Lab 08/27/22 0648  LIPASE 22   CBC: Recent Labs  Lab 08/28/22 0253 08/29/22 0900 08/30/22 0158 08/31/22 0314 09/01/22 0428  WBC 8.2 5.6 5.0 3.9* 3.8*  NEUTROABS  --   --  3.5 2.1 2.2  HGB 10.1* 9.4* 9.4* 9.2* 8.7*  HCT 34.8* 33.0* 32.1* 31.3* 30.6*  MCV 78.9* 80.9 79.5* 79.6* 81.6  PLT 188 181 168 170 160   Blood Culture    Component Value Date/Time   SDES URINE, RANDOM 08/29/2022 0528   SPECREQUEST  08/29/2022 0528    NONE Reflexed from [id number] Performed at [facility], [address] [place], [place] [address]    CULT MULTIPLE SPECIES PRESENT, SUGGEST RECOLLECTION (A) 08/29/2022 0528   REPTSTATUS 08/30/2022  FINAL 08/29/2022 0528   Studies/Results: CT ABDOMEN PELVIS W CONTRAST  Result Date: 08/30/2022 CLINICAL DATA:  Abdominal pain. EXAM: CT ABDOMEN AND PELVIS WITH CONTRAST TECHNIQUE: Multidetector CT imaging of the abdomen and pelvis was performed using the standard protocol following bolus administration of intravenous contrast. RADIATION DOSE REDUCTION: This exam was performed according to the departmental dose-optimization program which includes automated exposure control, adjustment of the mA and/or kV according to patient size and/or use of iterative reconstruction technique. CONTRAST:  72mL OMNIPAQUE IOHEXOL 350 MG/ML SOLN COMPARISON:  Recent CTA chest, abdomen and pelvis 08/27/2022, CT chest and upper abdomen without contrast 06/12/2022. FINDINGS: Lower chest: Stable cardiomegaly, today with an asymmetric right chamber predominance. Three-vessel calcific CAD. Infusion catheter again terminates at the superior cavoatrial junction and a small pericardial effusion again collecting to the right anteriorly. Still seen are a moderate sized left and small right layering pleural effusions with compressive atelectasis or consolidation in the basal segments of the left lower lobe. Thin walled posterior basal right lower lobe air cyst is again noted. There previously was interstitial edema in the lungs which is not seen today. The pulmonary veins are no longer dilated. There are calcified right hilar nodes. Hepatobiliary: The gallbladder  is increasingly distended up to 12 cm in length. Small stones layer in the proximal lumen. No wall thickening or bile duct dilatation. There are calcified granulomas in the liver but no mass enhancement. Pancreas: No mass, ductal dilatation or inflammatory change. Spleen: Again measuring mildly prominent 14.7 cm AP, capsular contraction again noted over a small hypodensity in the superior spleen which most likely either represents a subacute to late stage infarct or resolving  infectious process, and was considerably larger on 06/12/2022. Scattered calcified granulomas with no other focal abnormality. Adrenals/Urinary Tract: Chronic nodular thickening both adrenal glands is unchanged. There are renovascular calcifications. Mild global volume loss of both kidneys is again shown. No stone or urinary obstruction is seen. There is a new wedge-shaped hypoenhancement in the upper to midpole right kidney laterally (series 4 axial 40 and 41 and coronal reconstruction image 78 of series 7). This is most likely a renal infarct, alternatively could be related to pyelonephritis but there are no adjacent inflammatory changes. The remainder of the kidneys enhance homogeneously. There is faint contrast in the bladder. Mild bladder thickening and perivesical haziness compatible with cystitis continues to be seen. There is no bladder mass. Stomach/Bowel: Contracted stomach. No small bowel obstruction or inflammation is seen. The appendix contains contrast and is normal caliber. There or continued mesenteric congestive changes in the mid to lower abdomen and pelvis. There are colonic diverticula greatest in the sigmoid without a focal diverticulitis. Severe circumferential rectal thickening continues to be seen with adjacent stranding and has been seen on previous studies as well warranting direct visualization to exclude underlying lesion. Vascular/Lymphatic: Aortoiliac and extensive visceral branch vessel atherosclerosis. No AAA. No adenopathy. Reproductive: Status post hysterectomy. No adnexal masses. Other: Small amount of posterior pelvic ascites is again noted, trace fluid in the pericolic gutters and scattered within the mesenteric folds. Body wall anasarca noted moderately and appears similar. No free air, free hemorrhage or abscess. Musculoskeletal: Osteopenia and degenerative skeletal changes. No primary destructive or pathologic process. IMPRESSION: 1. New wedge-shaped hypoenhancement in the  upper to midpole right kidney laterally, most likely a renal infarct, less likely pyelonephritis. 2. Increasingly distended gallbladder with cholelithiasis but no wall thickening or bile duct dilatation. 3. Likely cystitis, seen previously. 4. Cardiomegaly with asymmetric right chamber predominance. Stable small pericardial effusion. 5. Moderate left and small right layering pleural effusions with compressive atelectasis or consolidation left lower lobe. Interstitial edema is no longer seen in the visualized lungs. 6. Stable small pericardial effusion. 7. Stable mild splenomegaly with capsular contraction over a small hypodensity in the superior spleen which most likely either represents a subacute to late stage infarct or resolving infectious process. 8. Continued severe circumferential rectal thickening with adjacent stranding. This could be due to proctitis or neoplasm. Direct visualization recommended unless already done. 9. Anasarca, mesenteric congestive changes, small amount of ascites similar to 3 days ago. 10. Diverticulosis without diverticulitis. 11. Aortic and coronary artery atherosclerosis. Aortic Atherosclerosis (ICD10-I70.0). Electronically Signed   By: Almira BarKeith  Chesser M.D.   On: 08/30/2022 20:52   ECHOCARDIOGRAM COMPLETE  Result Date: 08/30/2022    ECHOCARDIOGRAM REPORT   Patient Name:   Mellody DanceSANDRA Wallick Date of Exam: 08/30/2022 Medical Rec #:  [id number]    Height:       66.0 in Accession #:    [id number]   Weight:       196.4 lb Date of Birth:  09-May-1951    BSA:          1.985 m  Patient Age:    72 years     BP:           114/61 mmHg Patient Gender: F            HR:           103 bpm. Exam Location:  Inpatient Procedure: 2D Echo, Cardiac Doppler, Color Doppler and Intracardiac            Opacification Agent Indications:    Other abnormalities of the heart R00.8  History:        Patient has prior history of Echocardiogram examinations, most                 recent 02/02/2022. CHF, CAD and Previous  Myocardial Infarction,                 Stroke, Arrythmias:Tachycardia and RBBB, Signs/Symptoms:Syncope                 and Shortness of Breath; Risk Factors:Former Smoker,                 Dyslipidemia and Hypertension.  Sonographer:    Aron Baba Referring Phys: [phone] A CALDWELL POWELL JR  Sonographer Comments: Suboptimal apical window. Image acquisition challenging due to patient body habitus and Image acquisition challenging due to respiratory motion. IMPRESSIONS  1. Global hypokinesis with apical akinesis; overall moderate to severe LV dysfunction.  2. Left ventricular ejection fraction, by estimation, is 30 to 35%. The left ventricle has moderate to severely decreased function. The left ventricle demonstrates regional wall motion abnormalities (see scoring diagram/findings for description). There is mild left ventricular hypertrophy. Left ventricular diastolic parameters are indeterminate. Elevated left atrial pressure.  3. Right ventricular systolic function is mildly reduced. The right ventricular size is moderately enlarged. There is normal pulmonary artery systolic pressure.  4. Left atrial size was moderately dilated.  5. Right atrial size was mildly dilated.  6. Moderate pleural effusion in the left lateral region.  7. The mitral valve is normal in structure. No evidence of mitral valve regurgitation. No evidence of mitral stenosis.  8. Tricuspid valve regurgitation is moderate.  9. The aortic valve is normal in structure. Aortic valve regurgitation is not visualized. Aortic valve sclerosis/calcification is present, without any evidence of aortic stenosis. 10. The inferior vena cava is normal in size with greater than 50% respiratory variability, suggesting right atrial pressure of 3 mmHg. FINDINGS  Left Ventricle: Left ventricular ejection fraction, by estimation, is 30 to 35%. The left ventricle has moderate to severely decreased function. The left ventricle demonstrates regional wall motion  abnormalities. Definity contrast agent was given IV to delineate the left ventricular endocardial borders. The left ventricular internal cavity size was normal in size. There is mild left ventricular hypertrophy. Left ventricular diastolic parameters are indeterminate. Elevated left atrial pressure. Right Ventricle: The right ventricular size is moderately enlarged. Right ventricular systolic function is mildly reduced. There is normal pulmonary artery systolic pressure. The tricuspid regurgitant velocity is 2.58 m/s, and with an assumed right atrial pressure of 8 mmHg, the estimated right ventricular systolic pressure is 34.6 mmHg. Left Atrium: Left atrial size was moderately dilated. Right Atrium: Right atrial size was mildly dilated. Pericardium: There is no evidence of pericardial effusion. Mitral Valve: The mitral valve is normal in structure. Mild mitral annular calcification. No evidence of mitral valve regurgitation. No evidence of mitral valve stenosis. Tricuspid Valve: The tricuspid valve is normal in structure. Tricuspid valve regurgitation is moderate .  No evidence of tricuspid stenosis. Aortic Valve: The aortic valve is normal in structure. Aortic valve regurgitation is not visualized. Aortic valve sclerosis/calcification is present, without any evidence of aortic stenosis. Pulmonic Valve: The pulmonic valve was normal in structure. Pulmonic valve regurgitation is not visualized. No evidence of pulmonic stenosis. Aorta: The aortic root is normal in size and structure. Venous: The inferior vena cava is normal in size with greater than 50% respiratory variability, suggesting right atrial pressure of 3 mmHg. IAS/Shunts: No atrial level shunt detected by color flow Doppler. Additional Comments: Global hypokinesis with apical akinesis; overall moderate to severe LV dysfunction. There is a moderate pleural effusion in the left lateral region.  LEFT VENTRICLE PLAX 2D LVIDd:         5.10 cm   Diastology LVIDs:          4.10 cm   LV e' medial:    2.68 cm/s LV PW:         1.30 cm   LV E/e' medial:  51.9 LV IVS:        1.00 cm   LV e' lateral:   10.80 cm/s LVOT diam:     1.70 cm   LV E/e' lateral: 12.9 LV SV:         39 LV SV Index:   19 LVOT Area:     2.27 cm  RIGHT VENTRICLE RV S prime:     2.92 cm/s TAPSE (M-mode): 1.0 cm LEFT ATRIUM             Index        RIGHT ATRIUM           Index LA diam:        3.60 cm 1.81 cm/m   RA Area:     22.70 cm LA Vol (A2C):   97.5 ml 49.13 ml/m  RA Volume:   66.70 ml  33.61 ml/m LA Vol (A4C):   69.3 ml 34.92 ml/m LA Biplane Vol: 83.3 ml 41.97 ml/m  AORTIC VALVE             PULMONIC VALVE LVOT Vmax:   105.00 cm/s PR End Diast Vel: 3.44 msec LVOT Vmean:  68.700 cm/s LVOT VTI:    0.170 m  AORTA Ao Root diam: 3.40 cm Ao Asc diam:  3.20 cm MITRAL VALVE                TRICUSPID VALVE MV Area (PHT): 5.62 cm     TR Peak grad:   26.6 mmHg MV Decel Time: 135 msec     TR Vmax:        258.00 cm/s MV E velocity: 139.00 cm/s MV A velocity: 36.90 cm/s   SHUNTS MV E/A ratio:  3.77         Systemic VTI:  0.17 m                             Systemic Diam: 1.70 cm Olga Millers MD Electronically signed by Olga Millers MD Signature Date/Time: 08/30/2022/2:36:28 PM    Final    Medications:  cefTRIAXone (ROCEPHIN)  IV 2 g (08/31/22 2041)   heparin 1,500 Units/hr (09/01/22 0811)   metronidazole 500 mg (09/01/22 0319)   promethazine (PHENERGAN) injection (IM or IVPB) 6.25 mg (08/30/22 1128)    Chlorhexidine Gluconate Cloth  6 each Topical Q0600   insulin aspart  0-5 Units Subcutaneous QHS   insulin aspart  0-6 Units  Subcutaneous TID WC   insulin glargine-yfgn  15 Units Subcutaneous Q supper   levothyroxine  75 mcg Oral QAC breakfast   midodrine  5 mg Oral TID WC   pantoprazole  40 mg Oral BID   pregabalin  75 mg Oral Daily   sevelamer carbonate  1,600 mg Oral TID WC   sodium chloride flush  3 mL Intravenous Q12H   vortioxetine HBr  20 mg Oral Daily    Dialysis Orders: MWF SW 4h   400/800  93kg  2/2 bath  RIJ TDC    Heparin 4000 + - last HD 4/1, post wt 93kg - venofer 50mg  IV weekly - mircera 225 mcg IV q 2 wks, last 3/26, due 4/09   Assessment/Plan: Abdominal pain + vomiting + diarrhea: CT 4/3 with proctitis. HIDA scan negative, C.diff negative. GI consulted -> plan for abx only at that time. Repeat CT 4/6 with infarcts to spleen, kidney -> now in IV heparin. ESRD: Continue HD on MWF schedule - for HD today. Body wall edema on imaging - UF as tolerated. HTN/ volume: BP low/stable on midodrine, anasarca on imaging -> UF as tolerated. She is now below prior EDW, will lower on dischagre.   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 Secondary HPTH: CorrCa/Phos ok - continue Renvela as binder, no VRDA for now.   pA-fib: Prev on amiodarone + Eliquis, both on hold - on heparin drip now. PAD s/p R BKA T2DM: On insulin.   Ozzie Hoyle, PA-C 09/01/2022, 9:50 AM  [employer]

## 2022-09-01 NOTE — Progress Notes (Signed)
Tele called and reported pt in v tach.  Pt apears pale, diaphoretic and says "I feel sick".Wide complex tachycardia on monitor.  See vs's.  RRT called.  12 lead EKG done.  CBG 372.  MD paged and came to room.  Metoprolol 5mg  given IVP.  Pt will be moving to PCU bed when available.  HR is now 92.

## 2022-09-01 NOTE — Consult Note (Addendum)
Cardiology Consultation   Patient ID: Susan [name] MRN: [id number]; DOB: 1950/11/25  Admit date: 08/26/2022 Date of Consult: 09/01/2022  PCP:  Donita Brooks, MD   [facility] Providers Cardiologist:  Arvilla Meres, MD   {   Patient Profile:   Susan [name] is a 72 y.o. 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 Lowell Guitar.Marland Kitchen  History of Present Illness:   Susan [name] is a 72 year old female with the above history who is followed by Dr. Gala Romney for the above history. She has known CAD with prior MI in 2000 and again in 2007. She had BMS placed to RCA in 2007. Last ischemic evaluation was a Myoview in 2018 which was low risk with no significant ischemia or scar. She was admitted in 02/2021 with a NSTEMI. Echo showed a decline in EF to 30-35% with new inferior wall motion abnormality. She was suspected to have progression of RCA disease but was managed medically given CKD stage IV. Last Echo in 01/2022 during an admission for pancreatitis with CBD stone and bacteremia showed LVEF of 30-35% with akinesis of the inferior wall and apical septal segment and hypokinesis of the anterior septum and apical inferior segment. He was last seen by Prince Rome, NP, in [facility] in 02/2022 at which time she was feeling poor overall. She was continuing to have weakness and diarrhea. She also reported more shortness of breath and leg swelling. ReDs was elevated at 46%. Torsemide and Metolazone was increased. She was ultimately started on dialysis in 03/2022. 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 She was  admitted in 04/2022 with abdominal pain and diarrhea with bloody bowel movements. She was initially hypotensive requiring IV fluids as well as 4 units of PRBCs. GI was consulted and she underwent exam under anesthesia with oversewing of rectal ulcer. She was readmitted in 05/2022 with abdominal pain with associated nausea/vomiting and work-up showed severe constipation with stercoral colitis. She was also positive for C. Dif. She was treated with antibiotics. She also underwent a right BKA revision during this admission with placement of wound VAC given wound dehiscence.   Patient was admitted on 08/26/2022 for proctitis after presenting with abdominal pain, nausea, and vomiting. HIDA scan was normal. She was treated with antibiotics and PPI was increased. CTA also showed a splenic infarct. Anticoagulation was restarted (had been stopped due to GI bleeding in 05/2022). She doing better until earlier this afternoon when she started having more abdominal pain. She then began to feel like her heart was racing and had some shortness of breath, near syncope, and diaphoresis with this. Telemetry at the time showed a wide complex tachycardia with rates as high as the 180s concerning for VT.  This lasted about 10-15 minutes and then she went into what looks like atrial flutter with known RBBB. BP stable during this time. Rapid Response was called and she was given a bolus of IV Amiodarone. Cardiology consulted for further evaluation.  At the time of this evaluation, patient is resting comfortably in no acute distress. She is somewhat of a difficult historian. She did  admit to feeling poorly at time of even as described above with palpitations (heart racing), shortness of breath, diaphoresis, and near syncope. However, it does not sound like she has had any recent cardiac issues prior to admission. She is still residing at a SNF after recent hospitalizations. She is very sedentary and  not ambulate at all after right below  knee amputation. She has chronic dyspnea on exertion but does not sound like she has had any real orthopnea or PND. She has chronic edema of left lower extremity edema. No significant palpitations or lightheadedness/dizziness outside of tachycardic episode above. No syncope.  Past Medical History:  Diagnosis Date   Acute MI 1999; 2007; 03/05/21   Anemia    hx   Anginal pain    Anxiety    ARF (acute renal failure) 06/2017    Kidney Asso   Arthritis    "generalized" (03/15/2014)   CAD (coronary artery disease)    MI in 2000 - MI  2007 - treated bare metal stent (no nuclear since then as 9/11)   Carotid artery disease    CHF (congestive heart failure)    HFrEF 03/06/21   Chronic diastolic heart failure    a) ECHO (08/2013) EF 55-60% and RV function nl b) RHC (08/2013) RA 4, RV 30/5/7, PA 25/10 (16), PCWP 7, Fick CO/CI 6.3/2.7, PVR 1.5 WU, PA 61 and 66%   Daily headache    "~ every other day; since I fell in June" (03/15/2014)   Depression    Dyslipidemia    Dyspnea    uses oxygen qhs via [address]   ESRD (end stage renal disease)    Dialysis on Tues Thurs Sat   Exertional shortness of breath    History of kidney stones    HTN (hypertension)    Hypothyroidism    Neuropathy    Obesity    Osteoarthritis    PAF (paroxysmal atrial fibrillation)    Peripheral neuropathy    bilateral feet/hands   PONV (postoperative nausea and vomiting)    RBBB (right bundle branch block)    Old   Stroke    mini strokes   Syncope    likely due to low blood sugar   Tachycardia    Sinus tachycardia   Type II diabetes mellitus    Type II, [name]. patient has omnipod insulin pump with Novolin R Insulin   Urinary incontinence    Venous insufficiency     Past Surgical History:  Procedure Laterality Date   ABDOMINAL HYSTERECTOMY  1980's   AMPUTATION Right 02/24/2018   Procedure: RIGHT FOOT GREAT TOE AND 2ND TOE AMPUTATION;  Surgeon: Nadara Mustard, MD;  Location: [facility] OR;   Service: Orthopedics;  Laterality: Right;   AMPUTATION Right 04/30/2018   Procedure: RIGHT TRANSMETATARSAL AMPUTATION;  Surgeon: Nadara Mustard, MD;  Location: [facility] OR;  Service: Orthopedics;  Laterality: Right;   AMPUTATION Right 05/02/2022   Procedure: RIGHT BELOW KNEE AMPUTATION;  Surgeon: Nadara Mustard, MD;  Location: [facility] OR;  Service: Orthopedics;  Laterality: Right;   APPLICATION OF WOUND VAC Right 06/13/2022   Procedure: APPLICATION OF WOUND VAC;  Surgeon: Nadara Mustard, MD;  Location: [facility] OR;  Service: Orthopedics;  Laterality: Right;   AV FISTULA PLACEMENT Left 04/02/2022   Procedure: LEFT ARM ARTERIOVENOUS (AV) FISTULA CREATION;  Surgeon: Nada Libman, MD;  Location: [facility] OR;  Service: Vascular;  Laterality: Left;  PERIPHERAL NERVE BLOCK   BASCILIC VEIN TRANSPOSITION Left 07/31/2022  Procedure: LEFT ARM SECOND STAGE BASILIC VEIN TRANSPOSITION;  Surgeon: Nada Libman, MD;  Location[facility] OR;  Service: Vascular;  Laterality: Left;   BIOPSY  05/27/2020   Procedure: BIOPSY;  Surgeon: Lanelle Bal, DO;  Location: [facility]O SUITE;  Service: Endoscopy;;   CATARACT EXTRACTION, BILATERAL Bilateral ?2013   COLONOSCOPY W/ POLYPECTOMY     COLONOSCOPY WITH PROPOFOL N/A 03/13/2019   Procedure: COLONOSCOPY WITH PROPOFOL;  Surgeon: Beverley Fiedler, MD;  Location: [facility] ENDOSCOPY;  Service: Gastroenterology;  Laterality: N/A;   CORONARY ANGIOPLASTY WITH STENT PLACEMENT  1999; 2007   "1 + 1"   ERCP N/A 02/03/2022   Procedure: ENDOSCOPIC RETROGRADE CHOLANGIOPANCREATOGRAPHY (ERCP);  Surgeon: Jeani Hawking, MD;  Location: [facility] ENDOSCOPY;  Service: Gastroenterology;  Laterality: N/A;   ESOPHAGOGASTRODUODENOSCOPY (EGD) WITH PROPOFOL N/A 03/13/2019   Procedure: ESOPHAGOGASTRODUODENOSCOPY (EGD) WITH PROPOFOL;  Surgeon: Beverley Fiedler, MD;  Location: [facility] ENDOSCOPY;  Service: Gastroenterology;  Laterality: N/A;   ESOPHAGOGASTRODUODENOSCOPY (EGD) WITH PROPOFOL N/A 05/27/2020   Procedure: ESOPHAGOGASTRODUODENOSCOPY (EGD)  WITH PROPOFOL;  Surgeon: Lanelle Bal, DO;  [facility];  Service: Endoscopy;  Laterality: N/A;   EYE SURGERY Bilateral    lazer   FLEXIBLE SIGMOIDOSCOPY N/A 05/23/2022   Procedure: FLEXIBLE SIGMOIDOSCOPY;  Surgeon: Jeani Hawking, MD;  Location: [facility] ENDOSCOPY;  Service: Gastroenterology;  Laterality: N/A;   FLEXIBLE SIGMOIDOSCOPY N/A 05/24/2022   Procedure: FLEXIBLE SIGMOIDOSCOPY;  Surgeon: Imogene Burn, MD;  Location: [facility] ENDOSCOPY;  Service: Gastroenterology;  Laterality: N/A;   HEMOSTASIS CLIP PLACEMENT  03/13/2019   Procedure: HEMOSTASIS CLIP PLACEMENT;  Surgeon: Beverley Fiedler, MD;  Location: [facility] ENDOSCOPY;  Service: Gastroenterology;;   HEMOSTASIS CLIP PLACEMENT  05/23/2022   Procedure: HEMOSTASIS CLIP PLACEMENT;  Surgeon: Jeani Hawking, MD;  Location: [facility] ENDOSCOPY;  Service: Gastroenterology;;   HEMOSTASIS CONTROL  05/24/2022   Procedure: HEMOSTASIS CONTROL;  Surgeon: Imogene Burn, MD;  Location: [facility] ENDOSCOPY;  Service: Gastroenterology;;   HOT HEMOSTASIS N/A 05/23/2022   Procedure: HOT HEMOSTASIS (ARGON PLASMA COAGULATION/BICAP);  Surgeon: Jeani Hawking, MD;  Location: [facility] ENDOSCOPY;  Service: Gastroenterology;  Laterality: N/A;   I & D EXTREMITY Left 05/05/2022   Procedure: IRRIGATION AND DEBRIDEMENT LEFT ARM AV FISTULA;  Surgeon: Cephus Shelling, MD;  Location: [facility] OR;  Service: Vascular;  Laterality: Left;   INSERTION OF DIALYSIS CATHETER Right 04/02/2022   Procedure: INSERTION OF TUNNELED DIALYSIS CATHETER;  Surgeon: Nada Libman, MD;  Location: [facility] OR;  Service: Vascular;  Laterality: Right;   KNEE ARTHROSCOPY Left 10/25/2006   POLYPECTOMY  03/13/2019   Procedure: POLYPECTOMY;  Surgeon: Beverley Fiedler, MD;  Location: [facility] ENDOSCOPY;  Service: Gastroenterology;;   REMOVAL OF STONES  02/03/2022   Procedure: REMOVAL OF STONES;  Surgeon: Jeani Hawking, MD;  Location: [facility] ENDOSCOPY;  Service: Gastroenterology;;   REVISON OF ARTERIOVENOUS FISTULA Left 08/20/2022    Procedure: REVISON OF LEFT ARM ARTERIOVENOUS FISTULA;  Surgeon: Nada Libman, MD;  Location: [facility] OR;  Service: Vascular;  Laterality: Left;   RIGHT HEART CATH N/A 07/24/2017   Procedure: RIGHT HEART CATH;  Surgeon: Dolores Patty, MD;  Location: [facility] INVASIVE CV LAB;  Service: Cardiovascular;  Laterality: N/A;   RIGHT HEART CATHETERIZATION N/A 09/22/2013   Procedure: RIGHT HEART CATH;  Surgeon: Dolores Patty, MD;  Location: [facility] CATH LAB;  Service: Cardiovascular;  Laterality: N/A;   SHOULDER ARTHROSCOPY WITH OPEN ROTATOR CUFF REPAIR Right 03/14/2014   Procedure: RIGHT SHOULDER ARTHROSCOPY WITH BICEPS RELEASE, OPEN SUBSCAPULA REPAIR, OPEN  SUPRASPINATUS REPAIR.;  Surgeon: Cammy Copa, MD;  Location: [facility] OR;  Service: Orthopedics;  Laterality: Right;   SPHINCTEROTOMY  02/03/2022   Procedure: SPHINCTEROTOMY;  Surgeon: Jeani Hawking, MD;  Location: [facility] ENDOSCOPY;  Service: Gastroenterology;;   STUMP REVISION Right 06/13/2022   Procedure: REVISION RIGHT BELOW KNEE AMPUTATION;  Surgeon: Nadara Mustard, MD;  Location: [facility] OR;  Service: Orthopedics;  Laterality: Right;   TEE WITHOUT CARDIOVERSION N/A 02/04/2022   Procedure: TRANSESOPHAGEAL ECHOCARDIOGRAM (TEE);  Surgeon: Dolores Patty, MD;  Location: [facility] ENDOSCOPY;  Service: Cardiovascular;  Laterality: N/A;   THROMBECTOMY W/ EMBOLECTOMY Left 08/20/2022   Procedure: THROMBECTOMY OF LEFT ARM ARTERIOVENOUS FISTULA;  Surgeon: Nada Libman, MD;  Location: [facility] OR;  Service: Vascular;  Laterality: Left;   TOE AMPUTATION Right 02/24/2018   GREAT TOE AND 2ND TOE AMPUTATION   TUBAL LIGATION  1970's     Home Medications:  Prior to Admission medications   Medication Sig Start Date End Date Taking? Authorizing Provider  albuterol (VENTOLIN HFA) 108 (90 Base) MCG/ACT inhaler Inhale 1-2 puffs into the lungs every 6 (six) hours as needed for wheezing or shortness of breath.   Yes [provider]  amiodarone (PACERONE) 200 MG tablet Take 1  tablet (200 mg total) by mouth daily. 01/29/22  Yes Milford, Anderson Malta, FNP  ascorbic acid (VITAMIN C) 500 MG tablet Take 500 mg by mouth daily.   Yes [provider]  aspirin EC 81 MG tablet Take 1 tablet (81 mg total) by mouth daily with breakfast. 06/04/22  Yes Pahwani, Daleen Bo, MD  diphenoxylate-atropine (LOMOTIL) 2.5-0.025 MG tablet Take 1 tablet by mouth 4 (four) times daily as needed for diarrhea or loose stools. 08/19/21  Yes Donita Brooks, MD  ferrous sulfate 325 (65 FE) MG tablet Take 325 mg by mouth 2 (two) times daily. (1000 & 2100)   Yes [provider]  folic acid (FOLVITE) 1 MG tablet Take 1 mg by mouth daily.   Yes [provider]  hydrALAZINE (APRESOLINE) 25 MG tablet Take 1 tablet (25 mg total) by mouth every 6 (six) hours as needed (SBP>150 or DBP>100). 04/06/22  Yes Rhetta Mura, MD  hydrocortisone (ANUSOL-HC) 2.5 % rectal cream Place 1 Application rectally 2 (two) times daily. (1000 & 2100)   Yes [provider]  insulin aspart (NOVOLOG FLEXPEN) 100 UNIT/ML FlexPen Inject 0-15 Units into the skin See admin instructions. Inject 12 units subcutaneously 3 times daily with meals (0900, 1300 & 1700) & as needed via sliding scale insulin CBG <70=Notify NP/MD 71-149=0 units,150-199=4 units, 200-249=6 units,250- 299=10 units, 300-349=12 units, 350-399=15 units, > 400 call MD ( prime pen with 2 units prior  to set dose)   Yes [provider]  insulin glargine (LANTUS SOLOSTAR) 100 UNIT/ML Solostar Pen Inject 40 Units into the skin at bedtime. Patient taking differently: Inject 20 Units into the skin at bedtime. 06/17/22  Yes [place], Eric J, DO  levothyroxine (SYNTHROID) 75 MCG tablet Take 75 mcg by mouth daily before breakfast.   Yes [provider]  linagliptin (TRADJENTA) 5 MG TABS tablet Take 1 tablet (5 mg total) by mouth daily. 04/07/22  Yes Rhetta Mura, MD  LORazepam (ATIVAN) 0.5 MG tablet Take 1 tablet (0.5 mg total)  by mouth every 8 (eight) hours as needed for anxiety. 06/17/22  Yes [place], Alvira Philips, DO  midodrine (PROAMATINE) 10 MG tablet Take 10 mg by mouth every Monday, Wednesday, and Friday with hemodialysis.   Yes [provider]  midodrine (PROAMATINE) 5 MG tablet Take 5 mg by mouth See admin instructions. Take 1 tablet (5 mg) by mouth with meals on Tuesdays, Thursdays, Saturdays & Sundays.   Yes [provider]  Multiple Vitamin (DAILY VITE) TABS Take 1 tablet by mouth daily.   Yes [provider]  Nutritional Supplements (FEEDING SUPPLEMENT, NEPRO CARB STEADY,) LIQD Take 237 mLs by mouth daily. (0900)   Yes [provider]  ondansetron (ZOFRAN) 4 MG tablet Take 4 mg by mouth every 8 (eight) hours as needed for nausea.   Yes [provider]  oxyCODONE-acetaminophen (PERCOCET/ROXICET) 5-325 MG tablet Take 1 tablet by mouth every 6 (six) hours as needed. 08/20/22  Yes Baglia, Corrina, PA-C  OXYGEN Inhale 2 L/min into the lungs at bedtime.   Yes [provider]  pantoprazole (PROTONIX) 40 MG tablet Take 1 tablet (40 mg total) by mouth daily. 05/28/22 05/28/23 Yes Pahwani, Daleen Bo, MD  polyethylene glycol (MIRALAX / GLYCOLAX) 17 g packet Take 17 g by mouth 2 (two) times daily. Patient taking differently: Take 17 g by mouth daily as needed (constipation.). 06/17/22  Yes [place], Alvira Philips, DO  pregabalin (LYRICA) 75 MG capsule Take 1 capsule (75 mg total) by mouth daily. 04/07/22  Yes Rhetta Mura, MD  senna-docusate (SENOKOT-S) 8.6-50 MG tablet Take 2 tablets by mouth 2 (two) times daily. 06/17/22  Yes [place], Alvira Philips, DO  sevelamer carbonate (RENVELA) 800 MG tablet Take 1,600 mg by mouth 3 (three) times daily with meals. (0800, 1200 & 1700)   Yes [provider]  vortioxetine HBr (TRINTELLIX) 20 MG TABS tablet Take 1 tablet (20 mg total) by mouth in the morning. (1000) 08/11/22  Yes [name], Priscille Heidelberg, MD  Amino Acids-Protein Hydrolys (FEEDING SUPPLEMENT,  PRO-STAT SUGAR FREE 64,) LIQD Take 30 mLs by mouth in the morning and at bedtime. (1000 & 1400) Patient not taking: Reported on 08/27/2022    [provider]  blood glucose meter kit and supplies Use up to four times daily as directed. 01/23/22   Ghimire, Werner Lean, MD  Continuous Blood Gluc Sensor (FREESTYLE LIBRE 2 SENSOR) MISC  01/14/22   [provider]  Fingerstix Lancets MISC Use as directed to check blood sugars as need up to 4 times daily 01/23/22   Ghimire, Werner Lean, MD  fluconazole (DIFLUCAN) 150 MG tablet Take 150 mg by mouth every 3 (three) days. Patient not taking: Reported on 08/27/2022    [provider]  Insulin Pen Needle 32G X 4 MM MISC Use as directed 01/23/22   Maretta Bees, MD    Inpatient Medications: Scheduled Meds:  atorvastatin  80 mg Oral Daily   Chlorhexidine Gluconate Cloth  6 each Topical Q0600   heparin  4,000 Units Dialysis Once in dialysis   insulin aspart  0-5 Units Subcutaneous QHS   insulin aspart  0-6 Units Subcutaneous TID WC   insulin glargine-yfgn  15 Units Subcutaneous Q supper   levothyroxine  75 mcg Oral QAC breakfast   midodrine  5 mg Oral TID WC   pantoprazole  40 mg Oral BID   pregabalin  75 mg Oral Daily   sevelamer carbonate  1,600 mg Oral TID WC   sodium chloride flush  3 mL Intravenous Q12H   vortioxetine HBr  20 mg Oral Daily   Continuous Infusions:  amiodarone 60 mg/hr (09/01/22 1623)   amiodarone     anticoagulant sodium citrate     cefTRIAXone (ROCEPHIN)  IV 2 g (  08/31/22 2041)   heparin 1,650 Units/hr (09/01/22 1557)   metronidazole 500 mg (09/01/22 1455)   promethazine (PHENERGAN) injection (IM or IVPB) 6.25 mg (08/30/22 1128)   PRN Meds: acetaminophen **OR** acetaminophen, alteplase, anticoagulant sodium citrate, heparin, HYDROmorphone (DILAUDID) injection, lidocaine (PF), lidocaine-prilocaine, LORazepam, LORazepam, metoprolol tartrate, pentafluoroprop-tetrafluoroeth, prochlorperazine, promethazine  **OR** promethazine (PHENERGAN) injection (IM or IVPB) **OR** promethazine  Allergies:    Allergies  Allergen Reactions   Cephalexin Diarrhea and Other (See Comments)   Codeine Nausea And Vomiting and Other (See Comments)    Social History:   Social History   Socioeconomic History   Marital status: Married    Spouse name: Ramon Dredge   Number of children: 3   Years of education: 12th   Highest education level: Not on file  Occupational History    Employer: UNEMPLOYED  Tobacco Use   Smoking status: Former    Packs/day: 3.00    Years: 32.00    Additional pack years: 0.00    Total pack years: 96.00    Types: Cigarettes    Quit date: 10/24/1997    Years since quitting: 24.8   Smokeless tobacco: Never  Vaping Use   Vaping Use: Never used  Substance and Sexual Activity   Alcohol use: Not Currently    Comment: "might have 2-3 daiquiris in the summer"   Drug use: No   Sexual activity: Not Currently    Birth control/protection: Surgical    Comment: Hysterectomy  Other Topics Concern   Not on file  Social History Narrative   Pt lives at home with her spouse.Caffeine Use- 3 sodas daily.  Update:  now resides [facility]   Social Determinants of Health   Financial Resource Strain: Low Risk  (08/16/2021)   Overall Financial Resource Strain (CARDIA)    Difficulty of Paying Living Expenses: Not hard at all  Food Insecurity: No Food Insecurity (08/27/2022)   Hunger Vital Sign    Worried About Running Out of Food in the Last Year: Never true    Ran Out of Food in the Last Year: Never true  Transportation Needs: No Transportation Needs (08/27/2022)   PRAPARE - Administrator, Civil Service (Medical): No    Lack of Transportation (Non-Medical): No  Physical Activity: Inactive (08/16/2021)   Exercise Vital Sign    Days of Exercise per Week: 0 days    Minutes of Exercise per Session: 0 min  Stress: No Stress Concern Present (08/16/2021)   [employer] of Occupational  Health - Occupational Stress Questionnaire    Feeling of Stress : Not at all  Social Connections: Moderately Isolated (08/16/2021)   Social Connection and Isolation Panel [NHANES]    Frequency of Communication with Friends and Family: More than three times a week    Frequency of Social Gatherings with Friends and Family: More than three times a week    Attends Religious Services: Never    Database administrator or Organizations: No    Attends Banker Meetings: Never    Marital Status: Married  Catering manager Violence: Not At Risk (08/27/2022)   Humiliation, Afraid, Rape, and Kick questionnaire    Fear of Current or Ex-Partner: No    Emotionally Abused: No    Physically Abused: No    Sexually Abused: No    Family History:   Family History  Problem Relation Age of Onset   Heart attack Mother 34     ROS:  Please see the history of present  illness.  All other ROS reviewed and negative.     Physical Exam/Data:   Vitals:   09/01/22 1356 09/01/22 1454 09/01/22 1548 09/01/22 1615  BP: (!) 105/39 (!) 98/50 (!) 136/57 132/70  Pulse:  90 88   Resp: 17 17 16 17   Temp:   98.3 F (36.8 C)   TempSrc:   Oral   SpO2:  100% 100%   Weight:      Height:        Intake/Output Summary (Last 24 hours) at 09/01/2022 1640 Last data filed at 09/01/2022 0800 Gross per 24 hour  Intake 1066.06 ml  Output --  Net 1066.06 ml      08/30/2022    8:56 PM 08/29/2022    1:30 PM 08/29/2022    9:00 AM  Last 3 Weights  Weight (lbs) 196 lb 3.4 oz 196 lb 6.9 oz 199 lb 1.2 oz  Weight (kg) 89 kg 89.1 kg 90.3 kg     Body mass index is 31.67 kg/m.   Physical Exam per MD:  General: 72 y.o. Caucasian female resting comfortably in no acute distress. HEENT: Normocephalic and atraumatic.  Neck: Supple. No JVD. Heart: Mildly tachycardic with normal rhythm. No significant wheezes, rhonchi, or rales. Lungs: No increased work of breathing. Diminished breath sounds in bilateral bases. Otherwise, clear  to auscultation   Abdomen: Soft, non-distended, and non-tender to palpation. Extremities: S/p right below knee amputation. Trace edema of left lower extremity.   Skin: Warm and dry. Neuro: Alert and oriented x3. No focal deficits. Psych: Normal affect. Responds appropriately.  EKG:  The EKG was personally reviewed and demonstrates:  EKG on 08/4022 at 12:33 shows wide complex tachycardia, rate 139 bpm, that looks irregularly (likely atrial flutter).  Telemetry:  Telemetry was personally reviewed and demonstrates:  10-15 minute episode of wide complex tachycardia with rates in the 180s concerning for VT and then transitioned to rapid atrial flutter. Rates currently in the 80s (still appears to be atrial flutter).  Relevant CV Studies:  Echocardiogram 08/30/2022: Impressions:  1. Global hypokinesis with apical akinesis; overall moderate to severe LV  dysfunction.   2. Left ventricular ejection fraction, by estimation, is 30 to 35%. The  left ventricle has moderate to severely decreased function. The left  ventricle demonstrates regional wall motion abnormalities (see scoring  diagram/findings for description). There  is mild left ventricular hypertrophy. Left ventricular diastolic  parameters are indeterminate. Elevated left atrial pressure.   3. Right ventricular systolic function is mildly reduced. The right  ventricular size is moderately enlarged. There is normal pulmonary artery  systolic pressure.   4. Left atrial size was moderately dilated.   5. Right atrial size was mildly dilated.   6. Moderate pleural effusion in the left lateral region.   7. The mitral valve is normal in structure. No evidence of mitral valve  regurgitation. No evidence of mitral stenosis.   8. Tricuspid valve regurgitation is moderate.   9. The aortic valve is normal in structure. Aortic valve regurgitation is  not visualized. Aortic valve sclerosis/calcification is present, without  any evidence of aortic  stenosis.  10. The inferior vena cava is normal in size with greater than 50%  respiratory variability, suggesting right atrial pressure of 3 mmHg.    Laboratory Data:  High Sensitivity Troponin:   Recent Labs  Lab 08/26/22 2247 08/27/22 0115  TROPONINIHS 21* 22*     Chemistry Recent Labs  Lab 08/30/22 0158 08/31/22 0314 09/01/22 0428  NA 132*  128* 127*  K 2.9* 3.2* 3.7  CL 97* 97* 97*  CO2 24 25 23   GLUCOSE 118* 55* 338*  BUN 13 20 24*  CREATININE 2.56* 3.27* 4.59*  CALCIUM 8.1* 7.9* 7.8*  MG 1.7 1.7 1.9  GFRNONAA 19* 14* 10*  ANIONGAP 11 6 7     Recent Labs  Lab 08/30/22 0158 08/31/22 0314 09/01/22 0428  PROT 5.6* 5.2* 5.1*  ALBUMIN 2.4* 2.4* 2.2*  AST 15 15 12*  ALT 13 12 10   ALKPHOS 112 111 102  BILITOT 0.7 0.6 0.5   Lipids No results for input(s): "CHOL", "TRIG", "HDL", "LABVLDL", "LDLCALC", "CHOLHDL" in the last 168 hours.  Hematology Recent Labs  Lab 08/30/22 0158 08/31/22 0314 09/01/22 0428  WBC 5.0 3.9* 3.8*  RBC 4.04 3.93 3.75*  HGB 9.4* 9.2* 8.7*  HCT 32.1* 31.3* 30.6*  MCV 79.5* 79.6* 81.6  MCH 23.3* 23.4* 23.2*  MCHC 29.3* 29.4* 28.4*  RDW 20.3* 20.0* 19.9*  PLT 168 170 160   Thyroid No results for input(s): "TSH", "FREET4" in the last 168 hours.  BNPNo results for input(s): "BNP", "PROBNP" in the last 168 hours.  DDimer No results for input(s): "DDIMER" in the last 168 hours.   Radiology/Studies:  CT ABDOMEN PELVIS W CONTRAST  Result Date: 08/30/2022 CLINICAL DATA:  Abdominal pain. EXAM: CT ABDOMEN AND PELVIS WITH CONTRAST TECHNIQUE: Multidetector CT imaging of the abdomen and pelvis was performed using the standard protocol following bolus administration of intravenous contrast. RADIATION DOSE REDUCTION: This exam was performed according to the departmental dose-optimization program which includes automated exposure control, adjustment of the mA and/or kV according to patient size and/or use of iterative reconstruction technique.  CONTRAST:  75mL OMNIPAQUE IOHEXOL 350 MG/ML SOLN COMPARISON:  Recent CTA chest, abdomen and pelvis 08/27/2022, CT chest and upper abdomen without contrast 06/12/2022. FINDINGS: Lower chest: Stable cardiomegaly, today with an asymmetric right chamber predominance. Three-vessel calcific CAD. Infusion catheter again terminates at the superior cavoatrial junction and a small pericardial effusion again collecting to the right anteriorly. Still seen are a moderate sized left and small right layering pleural effusions with compressive atelectasis or consolidation in the basal segments of the left lower lobe. Thin walled posterior basal right lower lobe air cyst is again noted. There previously was interstitial edema in the lungs which is not seen today. The pulmonary veins are no longer dilated. There are calcified right hilar nodes. Hepatobiliary: The gallbladder is increasingly distended up to 12 cm in length. Small stones layer in the proximal lumen. No wall thickening or bile duct dilatation. There are calcified granulomas in the liver but no mass enhancement. Pancreas: No mass, ductal dilatation or inflammatory change. Spleen: Again measuring mildly prominent 14.7 cm AP, capsular contraction again noted over a small hypodensity in the superior spleen which most likely either represents a subacute to late stage infarct or resolving infectious process, and was considerably larger on 06/12/2022. Scattered calcified granulomas with no other focal abnormality. Adrenals/Urinary Tract: Chronic nodular thickening both adrenal glands is unchanged. There are renovascular calcifications. Mild global volume loss of both kidneys is again shown. No stone or urinary obstruction is seen. There is a new wedge-shaped hypoenhancement in the upper to midpole right kidney laterally (series 4 axial 40 and 41 and coronal reconstruction image 78 of series 7). This is most likely a renal infarct, alternatively could be related to  pyelonephritis but there are no adjacent inflammatory changes. The remainder of the kidneys enhance homogeneously. There is faint contrast in  the bladder. Mild bladder thickening and perivesical haziness compatible with cystitis continues to be seen. There is no bladder mass. Stomach/Bowel: Contracted stomach. No small bowel obstruction or inflammation is seen. The appendix contains contrast and is normal caliber. There or continued mesenteric congestive changes in the mid to lower abdomen and pelvis. There are colonic diverticula greatest in the sigmoid without a focal diverticulitis. Severe circumferential rectal thickening continues to be seen with adjacent stranding and has been seen on previous studies as well warranting direct visualization to exclude underlying lesion. Vascular/Lymphatic: Aortoiliac and extensive visceral branch vessel atherosclerosis. No AAA. No adenopathy. Reproductive: Status post hysterectomy. No adnexal masses. Other: Small amount of posterior pelvic ascites is again noted, trace fluid in the pericolic gutters and scattered within the mesenteric folds. Body wall anasarca noted moderately and appears similar. No free air, free hemorrhage or abscess. Musculoskeletal: Osteopenia and degenerative skeletal changes. No primary destructive or pathologic process. IMPRESSION: 1. New wedge-shaped hypoenhancement in the upper to midpole right kidney laterally, most likely a renal infarct, less likely pyelonephritis. 2. Increasingly distended gallbladder with cholelithiasis but no wall thickening or bile duct dilatation. 3. Likely cystitis, seen previously. 4. Cardiomegaly with asymmetric right chamber predominance. Stable small pericardial effusion. 5. Moderate left and small right layering pleural effusions with compressive atelectasis or consolidation left lower lobe. Interstitial edema is no longer seen in the visualized lungs. 6. Stable small pericardial effusion. 7. Stable mild splenomegaly  with capsular contraction over a small hypodensity in the superior spleen which most likely either represents a subacute to late stage infarct or resolving infectious process. 8. Continued severe circumferential rectal thickening with adjacent stranding. This could be due to proctitis or neoplasm. Direct visualization recommended unless already done. 9. Anasarca, mesenteric congestive changes, small amount of ascites similar to 3 days ago. 10. Diverticulosis without diverticulitis. 11. Aortic and coronary artery atherosclerosis. Aortic Atherosclerosis (ICD10-I70.0). Electronically Signed   By: Almira Bar M.D.   On: 08/30/2022 20:52   ECHOCARDIOGRAM COMPLETE  Result Date: 08/30/2022    ECHOCARDIOGRAM REPORT   Patient Name:   BRITTANNI CARIKER Date of Exam: 08/30/2022 Medical Rec #:  [id number]    Height:       66.0 in Accession #:    [id number]   Weight:       196.4 lb Date of Birth:  07/03/1950    BSA:          1.985 m Patient Age:    72 years     BP:           114/61 mmHg Patient Gender: F            HR:           103 bpm. Exam Location:  Inpatient Procedure: 2D Echo, Cardiac Doppler, Color Doppler and Intracardiac            Opacification Agent Indications:    Other abnormalities of the heart R00.8  History:        Patient has prior history of Echocardiogram examinations, most                 recent 02/02/2022. CHF, CAD and Previous Myocardial Infarction,                 Stroke, Arrythmias:Tachycardia and RBBB, Signs/Symptoms:Syncope                 and Shortness of Breath; Risk Factors:Former Smoker,  Dyslipidemia and Hypertension.  Sonographer:    Aron Baba Referring Phys: [phone] A CALDWELL POWELL JR  Sonographer Comments: Suboptimal apical window. Image acquisition challenging due to patient body habitus and Image acquisition challenging due to respiratory motion. IMPRESSIONS  1. Global hypokinesis with apical akinesis; overall moderate to severe LV dysfunction.  2. Left ventricular ejection  fraction, by estimation, is 30 to 35%. The left ventricle has moderate to severely decreased function. The left ventricle demonstrates regional wall motion abnormalities (see scoring diagram/findings for description). There is mild left ventricular hypertrophy. Left ventricular diastolic parameters are indeterminate. Elevated left atrial pressure.  3. Right ventricular systolic function is mildly reduced. The right ventricular size is moderately enlarged. There is normal pulmonary artery systolic pressure.  4. Left atrial size was moderately dilated.  5. Right atrial size was mildly dilated.  6. Moderate pleural effusion in the left lateral region.  7. The mitral valve is normal in structure. No evidence of mitral valve regurgitation. No evidence of mitral stenosis.  8. Tricuspid valve regurgitation is moderate.  9. The aortic valve is normal in structure. Aortic valve regurgitation is not visualized. Aortic valve sclerosis/calcification is present, without any evidence of aortic stenosis. 10. The inferior vena cava is normal in size with greater than 50% respiratory variability, suggesting right atrial pressure of 3 mmHg. FINDINGS  Left Ventricle: Left ventricular ejection fraction, by estimation, is 30 to 35%. The left ventricle has moderate to severely decreased function. The left ventricle demonstrates regional wall motion abnormalities. Definity contrast agent was given IV to delineate the left ventricular endocardial borders. The left ventricular internal cavity size was normal in size. There is mild left ventricular hypertrophy. Left ventricular diastolic parameters are indeterminate. Elevated left atrial pressure. Right Ventricle: The right ventricular size is moderately enlarged. Right ventricular systolic function is mildly reduced. There is normal pulmonary artery systolic pressure. The tricuspid regurgitant velocity is 2.58 m/s, and with an assumed right atrial pressure of 8 mmHg, the estimated right  ventricular systolic pressure is 34.6 mmHg. Left Atrium: Left atrial size was moderately dilated. Right Atrium: Right atrial size was mildly dilated. Pericardium: There is no evidence of pericardial effusion. Mitral Valve: The mitral valve is normal in structure. Mild mitral annular calcification. No evidence of mitral valve regurgitation. No evidence of mitral valve stenosis. Tricuspid Valve: The tricuspid valve is normal in structure. Tricuspid valve regurgitation is moderate . No evidence of tricuspid stenosis. Aortic Valve: The aortic valve is normal in structure. Aortic valve regurgitation is not visualized. Aortic valve sclerosis/calcification is present, without any evidence of aortic stenosis. Pulmonic Valve: The pulmonic valve was normal in structure. Pulmonic valve regurgitation is not visualized. No evidence of pulmonic stenosis. Aorta: The aortic root is normal in size and structure. Venous: The inferior vena cava is normal in size with greater than 50% respiratory variability, suggesting right atrial pressure of 3 mmHg. IAS/Shunts: No atrial level shunt detected by color flow Doppler. Additional Comments: Global hypokinesis with apical akinesis; overall moderate to severe LV dysfunction. There is a moderate pleural effusion in the left lateral region.  LEFT VENTRICLE PLAX 2D LVIDd:         5.10 cm   Diastology LVIDs:         4.10 cm   LV e' medial:    2.68 cm/s LV PW:         1.30 cm   LV E/e' medial:  51.9 LV IVS:        1.00 cm  LV e' lateral:   10.80 cm/s LVOT diam:     1.70 cm   LV E/e' lateral: 12.9 LV SV:         39 LV SV Index:   19 LVOT Area:     2.27 cm  RIGHT VENTRICLE RV S prime:     2.92 cm/s TAPSE (M-mode): 1.0 cm LEFT ATRIUM             Index        RIGHT ATRIUM           Index LA diam:        3.60 cm 1.81 cm/m   RA Area:     22.70 cm LA Vol (A2C):   97.5 ml 49.13 ml/m  RA Volume:   66.70 ml  33.61 ml/m LA Vol (A4C):   69.3 ml 34.92 ml/m LA Biplane Vol: 83.3 ml 41.97 ml/m  AORTIC  VALVE             PULMONIC VALVE LVOT Vmax:   105.00 cm/s PR End Diast Vel: 3.44 msec LVOT Vmean:  68.700 cm/s LVOT VTI:    0.170 m  AORTA Ao Root diam: 3.40 cm Ao Asc diam:  3.20 cm MITRAL VALVE                TRICUSPID VALVE MV Area (PHT): 5.62 cm     TR Peak grad:   26.6 mmHg MV Decel Time: 135 msec     TR Vmax:        258.00 cm/s MV E velocity: 139.00 cm/s MV A velocity: 36.90 cm/s   SHUNTS MV E/A ratio:  3.77         Systemic VTI:  0.17 m                             Systemic Diam: 1.70 cm Olga Millers MD Electronically signed by Olga Millers MD Signature Date/Time: 08/30/2022/2:36:28 PM    Final      Assessment and Plan:   Wide Complex Tachycardia Concerning for VT Patient was admitted for proctitis and went into a wide complex tachycardia today with rates in the 180s concerning for VT. This last for about 10-15 minutes and then went into what looks like rapid atrial flutter. She was very symptomatic with this with palpitations, shortness of breath, diaphoresis, and near syncope. No overt syncope. Never lost a pulse. Rapid reponse was called and she was treated with IV Amioadrone. She was severely hypokalemic with potassium of 2.9 yesterday but much improved to 3.7 today. Magnesium 1.9 today. Echo this admission showed LVEF of 30-35%.  Reviewed strips with EP and cannot rule out VT. - Continue IV Amiodarone. - She was started on Lopressor but she has history of hypotension and is on chronic midodrine. I don't think she will tolerate this well so will stop it. - Continue to monitor potassium and magnesium closely with goal to keep them above 4.0 and 2.0 respectively. Given ESRD, will defer supplementation to Nephrology or primary team. - Patient has known CAD with prior stenting and no recent ischemic evaluation. However, with recurrent GI issues with recent GI bleeding and current proctitis and no active anginal symptoms, no plans for ischemic evaluation at this time.  - Patient is not a good ICD  candidate with recurrent GI infections and other comorbidities.  CAD History of MI in 2000 and in 2007 s/p BMS to RCA 2007. Last Myoview in  2018 was negative for ischemia. - No chest pain.  - Has not been on Aspirin. Will need DOAC at discharge given history of atrial fibrillation/ flutter and splenic infarct so will not start now. - Previously on Lipitor 80mg  daily but no longer on this (unclear why). Will restart.  Acute Combined CHF Echo this admission showed LVEF of 30-35% with global hypokinesis and apical akinesis. CTA showed evidence anasarca.  - Does not appear significant volume overloaded on exam. - Volume status being managed  - GDMT limited by ESRD and hypotension.  Paroxysmal Atrial Fibrillation/ Flutter History of atrial fibrillation/ flutter.  - Looks like she is currently in atrial flutter with rates in the 80s. - Continue IV Amiodarone as above. - CHA2DS2-VASc = 8 (CAD, CHF, HTN, DM, splenic infarct x2, age, female). She was previously on Eliquis but this was stopped in 05/2022 during admission for GI bleed. However, CTA this admission showed multiple splenic infarcts so she has been restarted on IV Heparin. Will need DOAC at discharge if hemoglobin remains stable.  PAD S/p multiple amputations on the right with ultimately BKA in 04/2022 and then BKA revision due to wound dehiscence in 05/2022. - No aspirin as above. - Restart statin as above.  Hypotension BP soft at times but stable. - On chronic Midodrine (takes 10mg  three times daily on dialysis days and 5mg  on non-dialysis days at home but just on 5mg  three times daily here).  - She was started on Lopressor but she has history of hypotension and is on chronic midodrine. I don't think she will tolerate this well so will stop it.  Dyslipidemia LDL 29 in 01/2022.  - Previously Previously on Lipitor 80mg  daily but no longer on this (unclear why). Will restart given history of CAD and PAD.  ESRD On hemodialysis  M/W/F. - Management per Nephrology.  Otherwise, per primary team: - Proctitis - Splenic infarct - Acute on chronic anemia: hemoglobin 8.7 today - Type 2 diabetes mellitus - Hypothyroidism     Risk Assessment/Risk Scores:    New York Heart Association (NYHA) Functional Class Difficult to assess functional status as patient has not been ambulatory since right BKA in 04/2022.  CHA2DS2-VASc Score = 8  This indicates a 10.8% annual risk of stroke. The patient's score is based upon: CHF History: 1 HTN History: 1 Diabetes History: 1 Stroke History: 2 (splenic infarct) Vascular Disease History: 1 Age Score: 1 Gender Score: 1     For questions or updates, please contact [facility] Please consult [email or web address] for contact info under    Signed, Corrin Parker, PA-C  09/01/2022 4:40 PM  Patient seen and examined and agree with Marjie Skiff, PA-C as detailed above.  In brief, the patient is a  72 y.o. 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  Patient with complex medical history as detailed in HPI above. Presented on this admission for abdominal pain with concern for proctitis. Was overall improving until this afternoon when she developed a wide complex tachycardia with HR sustaining in 180s lasting 10-18min before slowing to 130-140s (Aflutter with RVR at  that point with RBBB). Rapid response team was called and patient was given IV amiodarone.  Overall, given age, comorbid conditions including CAD and HF, will treat wide  complex tachycardia as episode of VT although may have been Aflutter with aberrancy. Patient was symptomatic at time of event with palpitations, SOB, diaphoresis, lightheadedness but no syncope. Agree with initiation of amiodarone and likely this is her best option going forward due to chronic hypotension and ESRD limiting use of nodal agents and alternative antiarrhythmics. She is currently on heparin gtt for [facility] with goal of resuming apixaban if hemoglobin remains stable. She is not a currently a candidate for ICD/device due to active infection and ESRD with HD line in place. Would also defer ischemic testing for now as EF stable, patient is without anginal symptoms, and has significant comorbities including recent GIB making her very high risk.   GEN: Chronically ill appearing Neck: No JVD Cardiac: RRR, no murmurs, rubs, or gallops.  Respiratory: Diminished at the bases GI: Soft, nontender, non-distended  MS: Right BKA, left with trace LE edema Neuro:  Nonfocal  Psych: Normal affect    Plan: -Continue IV amiodarone for possible VT -Holding metop due to hypotension requiring midodrine -Likely PO amio is only good option going forward given hypotension and ESRD limiting use of other agents -On heparin gtt for now with goal to transition to apixaban if hemoglobin stable -No plans for ischemic work-up at this time as patient is very high risk with comorbidities including recent GIB, EF is stable on TTE, and she is not complaining of any anginal symptoms -She is not a ICD candidate at this time due to active infection and ESRD requiring HD line  -GDMT limited due to ESRD and hypotension  Laurance Flatten, MD

## 2022-09-01 NOTE — Progress Notes (Signed)
   09/01/22 1230  Spiritual Encounters  Type of Visit Initial  Care provided to: Patient  Conversation partners present during encounter Nurse;Physician  Referral source Other (comment) (rapid response)  Reason for visit Code (Rapid Response)  OnCall Visit No  Interventions  Spiritual Care Interventions Made Established relationship of care and support;Compassionate presence;Encouragement  Intervention Outcomes  Outcomes Reduced anxiety;Reduced fear  Spiritual Care Plan  Spiritual Care Issues Still Outstanding Chaplain will continue to follow  Follow up plan  Chaplain Tiburcio Pea will check in on patient as time allows.   Chaplain responded to rapid response team page. Medical team was working on the patient. Chaplain spoke to the patient from the doorway. Patient stated she did not need anything at the moment. Chaplain will check in on patient later.   Note Prepared by Arlyce Dice, Chaplain Resident [phone]

## 2022-09-01 NOTE — Consult Note (Signed)
   [facility] Inpatient Consult   09/01/2022  Karmin Colao 1950/08/30 [id number]  [facility] [[name]]  Accountable Care Organization [ACO] Patient: [place] [employer]  Primary Care Provider:  Donita Brooks, MD with [facility].   Patient screened for hospitalization with noted extreme high risk score for unplanned readmission risk to assess for potential [facility]  [[name]] Care Management service needs for post hospital transition for care coordination.  Review of patient's electronic medical record reveals patient is listed with [facility] for CM.  4:04 pm  Patient transitioning to another floor when came by to round.   Plan:  Continue to follow progress and disposition to assess for post hospital community care coordination/management needs.  Referral request for community care coordination:  patient was previously with [facility] for CM  Of note, [facility] Care Management/Population Health does not replace or interfere with any arrangements made by the Inpatient Transition of Care team.  For questions contact:   [name], RN BSN CCM [facility]  [phone] business mobile phone Toll free office [phone]  *Concierge Line  [phone] Fax number: [phone] [email or web address]

## 2022-09-01 NOTE — Progress Notes (Signed)
Pre HD Tx   09/01/22 2009  Vitals  Temp 98.2 F (36.8 C)  Pulse Rate 87  Resp 13  BP 120/63  SpO2 100 %  O2 Device Nasal Cannula  Weight 88.1 kg  Type of Weight Pre-Dialysis  Oxygen Therapy  O2 Flow Rate (L/min) 4 L/min  Patient Activity (if Appropriate) In bed  Pulse Oximetry Type Continuous  Oximetry Probe Site Changed No  Pre Treatment  Is pt a NEW START this admission?  No  HD catheter dressing before treatment WDL  Patient is receiving dialysis in a chair Yes  Hemodialysis Consent Verified Yes  Hemodialysis Standing Orders Initiated Yes  ECG (Telemetry) Monitor On Yes  Prime Ordered Normal Saline  Length of  DialysisTreatment -hour(s) 3.5 Hour(s)  Dialysis mode HD  Dialyzer Revaclear 400  Dialysate 3K  Dialysis Anticoagulation Heparin bolus  Dialysate Flow Ordered 300  Blood Flow Rate Ordered 400 mL/min  Ultrafiltration Goal 3000 Liters  Dialysis Blood Pressure Support Ordered Normal Saline

## 2022-09-02 DIAGNOSIS — R000 Tachycardia, unspecified: Secondary | ICD-10-CM | POA: Diagnosis not present

## 2022-09-02 DIAGNOSIS — R109 Unspecified abdominal pain: Secondary | ICD-10-CM | POA: Diagnosis not present

## 2022-09-02 DIAGNOSIS — I484 Atypical atrial flutter: Secondary | ICD-10-CM | POA: Diagnosis not present

## 2022-09-02 DIAGNOSIS — I2510 Atherosclerotic heart disease of native coronary artery without angina pectoris: Secondary | ICD-10-CM | POA: Diagnosis not present

## 2022-09-02 DIAGNOSIS — N186 End stage renal disease: Secondary | ICD-10-CM | POA: Diagnosis not present

## 2022-09-02 LAB — CBC WITH DIFFERENTIAL/PLATELET
Abs Immature Granulocytes: 0.01 10*3/uL (ref 0.00–0.07)
Basophils Absolute: 0 10*3/uL (ref 0.0–0.1)
Basophils Relative: 1 %
Eosinophils Absolute: 0.2 10*3/uL (ref 0.0–0.5)
Eosinophils Relative: 3 %
HCT: 31.8 % — ABNORMAL LOW (ref 36.0–46.0)
Hemoglobin: 9.5 g/dL — ABNORMAL LOW (ref 12.0–15.0)
Immature Granulocytes: 0 %
Lymphocytes Relative: 18 %
Lymphs Abs: 0.9 10*3/uL (ref 0.7–4.0)
MCH: 23.5 pg — ABNORMAL LOW (ref 26.0–34.0)
MCHC: 29.9 g/dL — ABNORMAL LOW (ref 30.0–36.0)
MCV: 78.7 fL — ABNORMAL LOW (ref 80.0–100.0)
Monocytes Absolute: 0.6 10*3/uL (ref 0.1–1.0)
Monocytes Relative: 11 %
Neutro Abs: 3.3 10*3/uL (ref 1.7–7.7)
Neutrophils Relative %: 67 %
Platelets: 186 10*3/uL (ref 150–400)
RBC: 4.04 MIL/uL (ref 3.87–5.11)
RDW: 19.9 % — ABNORMAL HIGH (ref 11.5–15.5)
WBC: 5 10*3/uL (ref 4.0–10.5)
nRBC: 0 % (ref 0.0–0.2)

## 2022-09-02 LAB — COMPREHENSIVE METABOLIC PANEL
ALT: 12 U/L (ref 0–44)
AST: 10 U/L — ABNORMAL LOW (ref 15–41)
Albumin: 2.4 g/dL — ABNORMAL LOW (ref 3.5–5.0)
Alkaline Phosphatase: 104 U/L (ref 38–126)
Anion gap: 12 (ref 5–15)
BUN: 19 mg/dL (ref 8–23)
CO2: 23 mmol/L (ref 22–32)
Calcium: 8.2 mg/dL — ABNORMAL LOW (ref 8.9–10.3)
Chloride: 94 mmol/L — ABNORMAL LOW (ref 98–111)
Creatinine, Ser: 3.44 mg/dL — ABNORMAL HIGH (ref 0.44–1.00)
GFR, Estimated: 14 mL/min — ABNORMAL LOW (ref >60)
Glucose, Bld: 282 mg/dL — ABNORMAL HIGH (ref 70–99)
Potassium: 3.4 mmol/L — ABNORMAL LOW (ref 3.5–5.1)
Sodium: 129 mmol/L — ABNORMAL LOW (ref 135–145)
Total Bilirubin: 0.6 mg/dL (ref 0.3–1.2)
Total Protein: 5.7 g/dL — ABNORMAL LOW (ref 6.5–8.1)

## 2022-09-02 LAB — GLUCOSE, CAPILLARY
Glucose-Capillary: 251 mg/dL — ABNORMAL HIGH (ref 70–99)
Glucose-Capillary: 286 mg/dL — ABNORMAL HIGH (ref 70–99)
Glucose-Capillary: 278 mg/dL — ABNORMAL HIGH (ref 70–99)
Glucose-Capillary: 274 mg/dL — ABNORMAL HIGH (ref 70–99)
Glucose-Capillary: 367 mg/dL — ABNORMAL HIGH (ref 70–99)

## 2022-09-02 LAB — HEPARIN LEVEL (UNFRACTIONATED): Heparin Unfractionated: 0.41 IU/mL (ref 0.30–0.70)

## 2022-09-02 LAB — PHOSPHORUS: Phosphorus: 3.3 mg/dL (ref 2.5–4.6)

## 2022-09-02 LAB — MAGNESIUM: Magnesium: 1.6 mg/dL — ABNORMAL LOW (ref 1.7–2.4)

## 2022-09-02 MED ORDER — DARBEPOETIN ALFA 100 MCG/0.5ML IJ SOSY
100.0000 ug | PREFILLED_SYRINGE | INTRAMUSCULAR | Status: DC
  Administered 2022-09-03: 100 ug via SUBCUTANEOUS
  Filled 2022-09-02: qty 0.5

## 2022-09-02 NOTE — Progress Notes (Signed)
Physical Therapy Treatment Patient Details Name: Susan [name] MRN: [id number] DOB: 1950/06/15 Today's Date: 09/02/2022   History of Present Illness 72 y.o. female presenting to [facility] ED 4/3 from SNF with severe abdominal pain, nausea, and vomiting. Imaging notable for proctitis with mesenteric congestive changes in the lower abdomen and pelvis; also notable RUQ TTP, RUQ US shows distended gall bladder with small stones and trace pericholecystic fluid. Recent thrombectomy of L upper arm arteriovenous fistula/revision of L arm arteriovenous fistula 3/27. PMH significant for MI, CHF, CAD, ESRD, HTN, PAF, DMII, R BKA.    PT Comments    Pt tolerated today's session well, continuing to be limited by skin breakdown on her bottom, but able to sit EOB for ~8 minutes. Pt requiring modA for all bed mobility today, declining any attempts at transferring to the chair but pt educated on methods to transfer and working towards that goal. Pt participated EOB and supine TherEx, return to supine sooner than anticipated due to hygiene after toileting. Acute PT will continue to follow up with pt to progress mobility, upright tolerance, and balance, discharge recommendations remain appropriate.     Recommendations for follow up therapy are one component of a multi-disciplinary discharge planning process, led by the attending physician.  Recommendations may be updated based on patient status, additional functional criteria and insurance authorization.  Follow Up Recommendations  Can patient physically be transported by private vehicle: No    Assistance Recommended at Discharge Frequent or constant Supervision/Assistance  Patient can return home with the following Two people to help with walking and/or transfers;A lot of help with bathing/dressing/bathroom;Assistance with cooking/housework;Direct supervision/assist for medications management;Direct supervision/assist for financial management;Assist for transportation;Help  with stairs or ramp for entrance   Equipment Recommendations  None recommended by PT    Recommendations for Other Services       Precautions / Restrictions Precautions Precautions: Fall Precaution Comments: pt is demonstrating limtations from skin breakdown on peri area Restrictions Weight Bearing Restrictions: No     Mobility  Bed Mobility Overal bed mobility: Needs Assistance Bed Mobility: Rolling, Supine to Sit, Sit to Supine Rolling: Mod assist   Supine to sit: Mod assist, HOB elevated Sit to supine: Mod assist, HOB elevated   General bed mobility comments: assist for trunk support and pivoting bed pads to prevent friction on pt's bottom when going into sitting, assist for BLE management when returning to supine. Rolling with modA with use of bed rail and bed pads today    Transfers                   General transfer comment: pt declined    Ambulation/Gait               General Gait Details: declined   Social research officer, government Rankin (Stroke Patients Only)       Balance Overall balance assessment: Needs assistance Sitting-balance support: Bilateral upper extremity supported, Feet supported Sitting balance-Leahy Scale: Fair                                      Cognition Arousal/Alertness: Awake/alert Behavior During Therapy: WFL for tasks assessed/performed Overall Cognitive Status: No family/caregiver present to determine baseline cognitive functioning Area of Impairment: Safety/judgement  Safety/Judgement: Decreased awareness of safety     General Comments: pt requires encouragement for mobility, education for offloading bottom and changing position to help with skin breakdown        Exercises General Exercises - Lower Extremity Long Arc Quad: AROM, Both, 10 reps, Seated Heel Slides: AROM, Left, 10 reps, Supine Hip ABduction/ADduction: AROM, Both,  10 reps, Supine    General Comments General comments (skin integrity, edema, etc.): vitals stable on 3L O2 [place], pt with fear of moving due to skin breakdown on her bottom but able to complete with encouragement      Pertinent Vitals/Pain Pain Assessment Pain Assessment: Faces Faces Pain Scale: Hurts even more Pain Location: peri area Pain Descriptors / Indicators: Grimacing, Guarding, Aching Pain Intervention(s): Repositioned, Monitored during session, Limited activity within patient's tolerance    Home Living                          Prior Function            PT Goals (current goals can now be found in the care plan section) Acute Rehab PT Goals Patient Stated Goal: to get home again PT Goal Formulation: With patient Time For Goal Achievement: 09/13/22 Potential to Achieve Goals: Good Progress towards PT goals: Progressing toward goals    Frequency    Min 3X/week      PT Plan Current plan remains appropriate    Co-evaluation              AM-PAC PT "6 Clicks" Mobility   Outcome Measure  Help needed turning from your back to your side while in a flat bed without using bedrails?: A Lot Help needed moving from lying on your back to sitting on the side of a flat bed without using bedrails?: A Lot Help needed moving to and from a bed to a chair (including a wheelchair)?: Total Help needed standing up from a chair using your arms (e.g., wheelchair or bedside chair)?: Total Help needed to walk in hospital room?: Total Help needed climbing 3-5 steps with a railing? : Total 6 Click Score: 8    End of Session Equipment Utilized During Treatment: Oxygen Activity Tolerance: Patient limited by pain;Patient tolerated treatment well Patient left: in bed;with call bell/phone within reach;with bed alarm set Nurse Communication: Need for lift equipment;Mobility status PT Visit Diagnosis: Unsteadiness on feet (R26.81);Other abnormalities of gait and mobility  (R26.89);Muscle weakness (generalized) (M62.81);Difficulty in walking, not elsewhere classified (R26.2);Pain Pain - Right/Left: Right Pain - part of body: Leg (perineum)     Time: 0258-5277 PT Time Calculation (min) (ACUTE ONLY): 26 min  Charges:  $Therapeutic Exercise: 8-22 mins $Therapeutic Activity: 8-22 mins                     Lindalou Hose, PT DPT Acute Rehabilitation Services Office [phone]    Leonie Man 09/02/2022, 1:35 PM

## 2022-09-02 NOTE — Progress Notes (Addendum)
PROGRESS NOTE    Susan [name]  HKV:[id number] DOB: February 13, 1951 DOA: 08/26/2022 PCP: Donita Brooks, MD  Chief Complaint  Patient presents with   Abdominal Pain    Brief Narrative:   72 yo F with ESRD, diastolic HF, peripheral neuropathy, HTN, hypothyroidism, and T2DM presenting with nausea, vomiting, and abdominal pain. These symptoms developed after eating mac and cheese and ham leftover from Easter. Imaging notable for proctitis with mesenteric congestive changes in the lower abdomen and pelvis. Also with notable RUQ TTP, RUQ US shows distended gallbladder with small stones and trace pericholecystic fluid. Currently on antibiotics to cover proctocolitis.   Imaging revealed splenic and renal infarcts.  Anticoagulation has been restarted.    Hospitalization c/b wide complex tachy, cardiology following, on amio drip.  Pain is persistent despite abx, GI planning for EGD/FFS 4/9.  See below for additional details  Assessment & Plan:   Principal Problem:   Abdominal pain Active Problems:   Right sided abdominal pain  Hx Afib  Wide Complex Tachycardia  Hold amiodarone with qtc prolongation Anticoagulation was previously hold due to hx rectal ulcer -> has now been started on heparin. Episode of Vtach 4/8, 10-15 min, then rapid Eldena Dede flutter.  Continue IV amiodarone.  Holding beta block due to hypotension hx and on chronic midodrine.  Defer mag/k supplementation to renal.  No plans for ischemic eval at this time per cards.  Abdominal pain  Intractable Nausea CT with proctitis with mesenteric congestive changes in lower abdomen and pelvis Continuing abx for now to cover proctitis - notably, hx of bleeding rectal ulcer, oversewn 12/230 by general surgery Flex sig 12/30 with multiple ulcers in rectum  RUQ TTP concerning for cholecystitis, will follow HIDA -> negative Repeat CT scan shows suspected renal infarct, gallbladder distension with cholelithiasis, suspected cystitis, cardiomegaly  with asymmetric R chamber predominance, small pericardial effusion, moderate L and small R layering effusions, subacute to late stage splenic infarct vs resolving infectious process, severe circumferential rectal thickening with adjacent stranding GI path panel negative.  C diff PCR negative, antigen positive  Diet as tolerated Will consult gastroenterology due to persistent symptoms which haven't significantly improved with antibiotics, appreciate their assistance - they suspect proctitis and mesenteric congestion are stable - related to volume? Fecal calprotectin, pancreatic elastase per GI pending Direct visualization recommended of rectal thickening, will defer decision to GI (she did have flex sig 05/24/22) - still has significant suprapubic TTP, will discuss with GI -> planning for EGD/FFS at 1230 on 4/10 Antiemetics prn   Volume overload Body wall anasarca increased per imaging Generalized interstitial edema and L>R effusions Ground glass edema Volume per renal -> dialysis per renal    ESRD Per renal    Hyponatremia Suspect related to hypervolemia  Volume per renal   Hypokalemia Improved, follow   Imaging concerning for cystitis Follow urine culture -> multiple species    Hypodense abnormality in superomedial aspect of spleen  Splenic Infarct Evolving infarct vs burned out infectious process -> Multiple infarcts per my discussion with radiology -> suspect this is in the setting of atrial fibrillation?  Heparin gtt started  Renal Infarct Wedge shaped hypoenhancement in upper to midpole R kidney, likely renal infarct Anticoagulation as noted above - suspect due to fib  T2DM  Hypoglycemia Hyperglycemia, increase back to 15 units basal SSI Holding tradjenta   Thrombocytopenia Resolved  Prolonged Qtc Repeat EKG -> improved Qtc 442 Caution with qt prolonging meds Hold amio for now   Hypertension On midodrine  with dialysis Has prn hydral? Would stop this at  d/c  Hypothyroidism Synthroid    DVT prophylaxis: heparin Code Status: full Family Communication: none Disposition:   Status is: Inpatient Remains inpatient appropriate because: need for inpatient care   Consultants:  GI  Procedures:  Echo IMPRESSIONS     1. Global hypokinesis with apical akinesis; overall moderate to severe LV  dysfunction.   2. Left ventricular ejection fraction, by estimation, is 30 to 35%. The  left ventricle has moderate to severely decreased function. The left  ventricle demonstrates regional wall motion abnormalities (see scoring  diagram/findings for description). There  is mild left ventricular hypertrophy. Left ventricular diastolic  parameters are indeterminate. Elevated left atrial pressure.   3. Right ventricular systolic function is mildly reduced. The right  ventricular size is moderately enlarged. There is normal pulmonary artery  systolic pressure.   4. Left atrial size was moderately dilated.   5. Right atrial size was mildly dilated.   6. Moderate pleural effusion in the left lateral region.   7. The mitral valve is normal in structure. No evidence of mitral valve  regurgitation. No evidence of mitral stenosis.   8. Tricuspid valve regurgitation is moderate.   9. The aortic valve is normal in structure. Aortic valve regurgitation is  not visualized. Aortic valve sclerosis/calcification is present, without  any evidence of aortic stenosis.  10. The inferior vena cava is normal in size with greater than 50%  respiratory variability, suggesting right atrial pressure of 3 mmHg.   Antimicrobials:  Anti-infectives (From admission, onward)    Start     Dose/Rate Route Frequency Ordered Stop   08/28/22 0400  metroNIDAZOLE (FLAGYL) IVPB 500 mg        500 mg 100 mL/hr over 60 Minutes Intravenous Every 8 hours 08/27/22 2048 09/02/22 2359   08/27/22 2200  vancomycin (VANCOCIN) 50 mg/mL oral solution SOLN 125 mg  Status:  Discontinued         125 mg Oral Daily at bedtime 08/27/22 1946 08/27/22 2042   08/27/22 2200  vancomycin (VANCOCIN) capsule 125 mg  Status:  Discontinued        125 mg Oral Daily at bedtime 08/27/22 2042 08/27/22 2048   08/27/22 2045  cefTRIAXone (ROCEPHIN) 2 g in sodium chloride 0.9 % 100 mL IVPB        2 g 200 mL/hr over 30 Minutes Intravenous Every 24 hours 08/27/22 1949 09/02/22 2359   08/27/22 1000  metroNIDAZOLE (FLAGYL) IVPB 500 mg  Status:  Discontinued        500 mg 100 mL/hr over 60 Minutes Intravenous Every 12 hours 08/27/22 0848 08/27/22 2048       Subjective: No new complaints  Objective: Vitals:   09/02/22 0500 09/02/22 0752 09/02/22 1057 09/02/22 1500  BP:  120/68 (!) 110/53   Pulse: 93 94 92 92  Resp: 13 16 (!) 9 14  Temp:  98 F (36.7 C) 98.2 F (36.8 C)   TempSrc:  Oral Oral   SpO2: 100% 100% 100% 100%  Weight:      Height:        Intake/Output Summary (Last 24 hours) at 09/02/2022 1856 Last data filed at 09/02/2022 1454 Gross per 24 hour  Intake 480 ml  Output 3000 ml  Net -2520 ml   Filed Weights   08/30/22 2056 09/01/22 2009 09/02/22 0001  Weight: 89 kg 88.1 kg 85.2 kg    Examination:  General: No acute distress. Cardiovascular: RRR Lungs:  unlabored Abdomen: still diffusely TTP on abdominal exam Neurological: Alert and oriented 3. Moves all extremities 4 with equal strength. Cranial nerves II through XII grossly intact. Extremities: R BKA    Data Reviewed: I have personally reviewed following labs and imaging studies  CBC: Recent Labs  Lab 08/26/22 2247 08/27/22 1053 08/29/22 0900 08/30/22 0158 08/31/22 0314 09/01/22 0428 09/02/22 0723  WBC 9.2   < > 5.6 5.0 3.9* 3.8* 5.0  NEUTROABS 8.1*  --   --  3.5 2.1 2.2 3.3  HGB 10.3*   < > 9.4* 9.4* 9.2* 8.7* 9.5*  HCT 35.3*   < > 33.0* 32.1* 31.3* 30.6* 31.8*  MCV 79.1*   < > 80.9 79.5* 79.6* 81.6 78.7*  PLT 100*   < > 181 168 170 160 186   < > = values in this interval not displayed.    Basic  Metabolic Panel: Recent Labs  Lab 08/27/22 1313 08/28/22 0253 08/29/22 0900 08/30/22 0158 08/31/22 0314 09/01/22 0428 09/02/22 0723  NA 131*   < > 134* 132* 128* 127* 129*  K 2.9*   < > 3.0* 2.9* 3.2* 3.7 3.4*  CL 93*   < > 93* 97* 97* 97* 94*  CO2 25   < > GLUCOSE 302*   < > 220* 118* 55* 338* 282*  BUN 32*   < > 27* 13 20 24* 19  CREATININE 3.52*   < > 3.69* 2.56* 3.27* 4.59* 3.44*  CALCIUM 8.8*   < > 8.7* 8.1* 7.9* 7.8* 8.2*  MG  --   --   --  1.7 1.7 1.9 1.6*  PHOS 5.5*  --   --  4.1 4.2 5.1* 3.3   < > = values in this interval not displayed.    GFR: Estimated Creatinine Clearance: 16 mL/min (Alexsys Eskin) (by C-G formula based on SCr of 3.44 mg/dL (H)).  Liver Function Tests: Recent Labs  Lab 08/29/22 0900 08/30/22 0158 08/31/22 0314 09/01/22 0428 09/02/22 0723  AST 14* 15 15 12* 10*  ALT ALKPHOS 128* 112 111 102 104  BILITOT 0.9 0.7 0.6 0.5 0.6  PROT 5.9* 5.6* 5.2* 5.1* 5.7*  ALBUMIN 2.6* 2.4* 2.4* 2.2* 2.4*    CBG: Recent Labs  Lab 09/01/22 1701 09/02/22 0405 09/02/22 0750 09/02/22 1057 09/02/22 1536  GLUCAP 382* 251* 286* 278* 274*     Recent Results (from the past 240 hour(s))  Gastrointestinal Panel by PCR , Stool     Status: None   Collection Time: 08/27/22  8:45 AM   Specimen: STOOL  Result Value Ref Range Status   Campylobacter species NOT DETECTED NOT DETECTED Final   Plesimonas shigelloides NOT DETECTED NOT DETECTED Final   Salmonella species NOT DETECTED NOT DETECTED Final   Yersinia enterocolitica NOT DETECTED NOT DETECTED Final   Vibrio species NOT DETECTED NOT DETECTED Final   Vibrio cholerae NOT DETECTED NOT DETECTED Final   Enteroaggregative E coli (EAEC) NOT DETECTED NOT DETECTED Final   Enteropathogenic E coli (EPEC) NOT DETECTED NOT DETECTED Final   Enterotoxigenic E coli (ETEC) NOT DETECTED NOT DETECTED Final   Shiga like toxin producing E coli (STEC) NOT DETECTED NOT DETECTED Final    Shigella/Enteroinvasive E coli (EIEC) NOT DETECTED NOT DETECTED Final   Cryptosporidium NOT DETECTED NOT DETECTED Final   Cyclospora cayetanensis NOT DETECTED NOT DETECTED Final   Entamoeba histolytica NOT DETECTED NOT DETECTED Final   Giardia lamblia NOT DETECTED  NOT DETECTED Final   Adenovirus F40/41 NOT DETECTED NOT DETECTED Final   Astrovirus NOT DETECTED NOT DETECTED Final   Norovirus GI/GII NOT DETECTED NOT DETECTED Final   Rotavirus Roxene Alviar NOT DETECTED NOT DETECTED Final   Sapovirus (I, II, IV, and V) NOT DETECTED NOT DETECTED Final    Comment: Performed at [facility], [address], [place], [place] [address]  C Difficile Quick Screen w PCR reflex     Status: Abnormal   Collection Time: 08/27/22  8:45 AM   Specimen: STOOL  Result Value Ref Range Status   C Diff antigen POSITIVE (Dutch Ing) NEGATIVE Final   C Diff toxin NEGATIVE NEGATIVE Final   C Diff interpretation Results are indeterminate. See PCR results.  Final    Comment: Performed at [facility], [address] [place], [place] [address]  C. Diff by PCR, Reflexed     Status: None   Collection Time: 08/27/22  8:45 AM  Result Value Ref Range Status   Toxigenic C. Difficile by PCR NEGATIVE NEGATIVE Final    Comment: Patient is colonized with non toxigenic C. difficile. May not need treatment unless significant symptoms are present. Performed at [facility], [address], [place], [place] [address]   Urine Culture     Status: Abnormal   Collection Time: 08/29/22  5:28 AM   Specimen: Urine, Random  Result Value Ref Range Status   Specimen Description URINE, RANDOM  Final   Special Requests   Final    NONE Reflexed from [id number] Performed at [facility], [address], [place], [place] [address]    Culture MULTIPLE SPECIES PRESENT, SUGGEST RECOLLECTION (Amory Simonetti)  Final   Report Status 08/30/2022 FINAL  Final         Radiology Studies: DG CHEST PORT 1 VIEW  Result Date: 09/01/2022 CLINICAL DATA:   Shortness of breath EXAM: PORTABLE CHEST 1 VIEW COMPARISON:  06/14/2022 FINDINGS: Cardiac shadow is stable. Aortic calcifications are seen. Right jugular dialysis catheter is again seen and stable. The lungs are well aerated bilaterally. No focal infiltrate or effusion is seen. No bony abnormality is noted. IMPRESSION: No active disease. Electronically Signed   By: Alcide Clever M.D.   On: 09/01/2022 19:10        Scheduled Meds:  atorvastatin  80 mg Oral Daily   Chlorhexidine Gluconate Cloth  6 each Topical Q0600   [START ON 09/03/2022] darbepoetin (ARANESP) injection - DIALYSIS  100 mcg Subcutaneous Q Wed-1800   insulin aspart  0-5 Units Subcutaneous QHS   insulin aspart  0-6 Units Subcutaneous TID WC   insulin glargine-yfgn  15 Units Subcutaneous Q supper   levothyroxine  75 mcg Oral QAC breakfast   midodrine  5 mg Oral TID WC   pantoprazole  40 mg Oral BID   pregabalin  75 mg Oral Daily   sevelamer carbonate  1,600 mg Oral TID WC   sodium chloride flush  3 mL Intravenous Q12H   vortioxetine HBr  20 mg Oral Daily   Continuous Infusions:  amiodarone 30 mg/hr (09/02/22 1415)   cefTRIAXone (ROCEPHIN)  IV 2 g (09/02/22 0130)   heparin 1,650 Units/hr (09/02/22 0647)   metronidazole 500 mg (09/02/22 1309)   promethazine (PHENERGAN) injection (IM or IVPB) 6.25 mg (08/30/22 1128)     LOS: 6 days    Time spent: over 30 min    Lacretia Nicks, MD [facility]   To contact the attending provider between 7A-7P  or the covering provider during after hours 7P-7A, please log into the web site [email or web address] and access using universal [place] password for that web site. If you do not have the password, please call the hospital operator.  09/02/2022, 6:56 PM

## 2022-09-02 NOTE — Progress Notes (Signed)
Post HD TX  09/02/22 0001  Vitals  Temp 98.3 F (36.8 C)  Pulse Rate 95  Resp 16  BP (!) 124/57  SpO2 100 %  O2 Device Nasal Cannula  Weight 85.2 kg  Type of Weight Post-Dialysis  Oxygen Therapy  O2 Flow Rate (L/min) 4 L/min  Patient Activity (if Appropriate) In bed  Pulse Oximetry Type Continuous  Post Treatment  Dialyzer Clearance Lightly streaked  Duration of HD Treatment -hour(s) 6.5 hour(s)  Liters Processed 81.9  Fluid Removed (mL) 3000 mL  Tolerated HD Treatment Yes

## 2022-09-02 NOTE — Progress Notes (Signed)
[facility] Progress Note   Subjective:  Seen in room - says she feels much better today, only current complaint is jaw pain which has been present for several hours now. Ended up getting HD overnight - had to be postponed after she had episode of VT yesterday afternoon requiring rapid response to be called and being restarted on amio drip. Cardiology is now involved/following her. She reports abd pain and diarrhea are slowly improving.  Objective Vitals:   09/02/22 0300 09/02/22 0400 09/02/22 0500 09/02/22 0752  BP:  (!) 110/59  120/68  Pulse: 95 94 93 94  Resp: (!) 9 (!) 23 13 16   Temp:  97.9 F (36.6 C)  98 F (36.7 C)  TempSrc:  Oral  Oral  SpO2: 100% 99% 100% 100%  Weight:      Height:       Physical Exam General: Chronically ill appearing woman, NAD. Nasal O2 in place. Heart: RRR; no murmur Lungs: CTA anteriorly Abdomen: mildly tender, but soft Extremities: R BKA, trace LLE edema Dialysis Access: TDC in R chest, LUE AVF maturing + thrill  Additional Objective Labs: Basic Metabolic Panel: Recent Labs  Lab 08/31/22 0314 09/01/22 0428 09/02/22 0723  NA 128* 127* 129*  K 3.2* 3.7 3.4*  CL 97* 97* 94*  CO2 25 23 23   GLUCOSE 55* 338* 282*  BUN 20 24* 19  CREATININE 3.27* 4.59* 3.44*  CALCIUM 7.9* 7.8* 8.2*  PHOS 4.2 5.1* 3.3   Liver Function Tests: Recent Labs  Lab 08/31/22 0314 09/01/22 0428 09/02/22 0723  AST 15 12* 10*  ALT 12 10 12   ALKPHOS 111 102 104  BILITOT 0.6 0.5 0.6  PROT 5.2* 5.1* 5.7*  ALBUMIN 2.4* 2.2* 2.4*   Recent Labs  Lab 08/27/22 0648  LIPASE 22   CBC: Recent Labs  Lab 08/29/22 0900 08/30/22 0158 08/31/22 0314 09/01/22 0428 09/02/22 0723  WBC 5.6 5.0 3.9* 3.8* 5.0  NEUTROABS  --  3.5 2.1 2.2 3.3  HGB 9.4* 9.4* 9.2* 8.7* 9.5*  HCT 33.0* 32.1* 31.3* 30.6* 31.8*  MCV 80.9 79.5* 79.6* 81.6 78.7*  PLT 181 168 170 160 186   CBG: Recent Labs  Lab 09/01/22 1150 09/01/22 1230 09/01/22 1701 09/02/22 0405  09/02/22 0750  GLUCAP 325* 371* 382* 251* 286*   Studies/Results: DG CHEST PORT 1 VIEW  Result Date: 09/01/2022 CLINICAL DATA:  Shortness of breath EXAM: PORTABLE CHEST 1 VIEW COMPARISON:  06/14/2022 FINDINGS: Cardiac shadow is stable. Aortic calcifications are seen. Right jugular dialysis catheter is again seen and stable. The lungs are well aerated bilaterally. No focal infiltrate or effusion is seen. No bony abnormality is noted. IMPRESSION: No active disease. Electronically Signed   By: Alcide Clever M.D.   On: 09/01/2022 19:10    Medications:  amiodarone 30 mg/hr (09/02/22 0126)   cefTRIAXone (ROCEPHIN)  IV 2 g (09/02/22 0130)   heparin 1,650 Units/hr (09/02/22 5953)   metronidazole 500 mg (09/02/22 0425)   promethazine (PHENERGAN) injection (IM or IVPB) 6.25 mg (08/30/22 1128)    atorvastatin  80 mg Oral Daily   Chlorhexidine Gluconate Cloth  6 each Topical Q0600   insulin aspart  0-5 Units Subcutaneous QHS   insulin aspart  0-6 Units Subcutaneous TID WC   insulin glargine-yfgn  15 Units Subcutaneous Q supper   levothyroxine  75 mcg Oral QAC breakfast   midodrine  5 mg Oral TID WC   pantoprazole  40 mg Oral BID   pregabalin  75 mg  Oral Daily   sevelamer carbonate  1,600 mg Oral TID WC   sodium chloride flush  3 mL Intravenous Q12H   vortioxetine HBr  20 mg Oral Daily    Dialysis Orders: MWF SW 4hr, 400/800, EDW 93kg, 2K/2Ca bath, RIJ TDC, Heparin 4000 + - venofer 50mg  IV weekly - mircera 225 mcg IV q 2 wks, last 3/26, due 4/09   Assessment/Plan: Abdominal pain + vomiting + diarrhea: CT 4/3 with proctitis. HIDA scan negative, C.diff negative. GI consulted -> plan for IV Ceftriaxone + Flagyl only at that time. Repeat CT 4/6 with infarcts to spleen, kidney -> now on IV heparin. Non-sustained VT episode 4/8: Cardiology following, back on amiodarone drip. ESRD: Continue HD on MWF schedule - next tomorrow. Body wall edema on imaging - UF as tolerated. HTN/ volume: BP  low/stable on midodrine, anasarca on imaging -> UF as tolerated. She is now below prior EDW, will lower on dischagre.   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 Secondary HPTH: CorrCa/Phos ok - continue Renvela as binder, no VRDA for now.   pA-fib: Prev on amiodarone + Eliquis, both held -> now back on amio + heparin drip. PAD s/p R BKA T2DM: On insulin, BS high recently.    Ozzie Hoyle, PA-C 09/02/2022, 10:26 AM  [employer]

## 2022-09-02 NOTE — TOC Progression Note (Signed)
Transition of Care [facility]) - Progression Note    Patient Details  Name: Susan [name] MRN: [id number] Date of Birth: 04/03/1951  [facility] CM/SW Contact  Mearl Latin, LCSW Phone Number: 09/02/2022, 4:20 PM  Clinical Narrative:    Patient transferred to 5W. CSW following for medical progression. [facility] will need insurance approval for patient to return. Patient has used many of her SNF days and does not have Medicaid.    Expected Discharge Plan: Skilled Nursing Facility Barriers to Discharge: Continued Medical Work up, English as a second language teacher  Expected Discharge Plan and Services       Living arrangements for the past 2 months: Skilled Nursing Facility Expected Discharge Date: 08/30/22                                     Social Determinants of Health (SDOH) Interventions SDOH Screenings   Food Insecurity: No Food Insecurity (08/27/2022)  Housing: Low Risk  (08/27/2022)  Transportation Needs: No Transportation Needs (08/27/2022)  Utilities: Not At Risk (08/27/2022)  Alcohol Screen: Low Risk  (08/16/2021)  Depression (PHQ2-9): Low Risk  (04/23/2022)  Financial Resource Strain: Low Risk  (08/16/2021)  Physical Activity: Inactive (08/16/2021)  Social Connections: Moderately Isolated (08/16/2021)  Stress: No Stress Concern Present (08/16/2021)  Tobacco Use: Medium Risk (08/31/2022)    Readmission Risk Interventions    05/12/2022    5:04 PM 02/07/2022    2:57 PM 01/23/2022   12:38 PM  Readmission Risk Prevention Plan  Transportation Screening Complete Complete Complete  PCP or Specialist Appt within 3-5 Days   Complete  HRI or Home Care Consult   Complete  Social Work Consult for Recovery Care Planning/Counseling   Complete  Palliative Care Screening   Not Applicable  Medication Review Oceanographer)  Complete Complete  PCP or Specialist appointment within 3-5 days of discharge  Complete   HRI or Home Care Consult  Complete   SW Recovery Care/Counseling  Consult Complete Complete   Palliative Care Screening  Not Applicable   Skilled Nursing Facility Complete Not Applicable

## 2022-09-02 NOTE — Inpatient Diabetes Management (Signed)
Inpatient Diabetes Program Recommendations  AACE/ADA: New Consensus Statement on Inpatient Glycemic Control (2015)  Target Ranges:  Prepandial:   less than 140 mg/dL      Peak postprandial:   less than 180 mg/dL (1-2 hours)      Critically ill patients:  140 - 180 mg/dL   Lab Results  Component Value Date   GLUCAP 286 (H) 09/02/2022   HGBA1C 7.9 (H) 08/27/2022    Review of Glycemic Control  Latest Reference Range & Units 09/01/22 07:33 09/01/22 11:50 09/01/22 12:30 09/01/22 17:01 09/02/22 04:05 09/02/22 07:50  Glucose-Capillary 70 - 99 mg/dL 601 (H) 093 (H) 235 (H) 382 (H) 251 (H) 286 (H)  (H): Data is abnormally high  Current orders for Inpatient glycemic control: Semglee 15 units QD, Novolog 0-6 units TID and 0-5 units QHS  Inpatient Diabetes Program Recommendations:    Might consider:  Semglee 18 units QD Novolog 3 units TID with meals if eats at least 50%  Will continue to follow while inpatient.  Thank you, Dulce Sellar, MSN, CDCES Diabetes Coordinator Inpatient Diabetes Program [phone] (team pager from 8a-5p)

## 2022-09-02 NOTE — Progress Notes (Signed)
ANTICOAGULATION CONSULT NOTE - Follow Up Consult  Pharmacy Consult for Heparin Indication:  Afib with new splenic infarct, and potential renal infarct  Allergies  Allergen Reactions   Cephalexin Diarrhea and Other (See Comments)   Codeine Nausea And Vomiting and Other (See Comments)    Patient Measurements: Height: 5\' 6"  (167.6 cm) Weight: 85.2 kg (187 lb 13.3 oz) IBW/kg (Calculated) : 59.3 Heparin Dosing Weight: 81.8 kg  Vital Signs: Temp: 98 F (36.7 C) (04/09 0752) Temp Source: Oral (04/09 0752) BP: 120/68 (04/09 0752) Pulse Rate: 94 (04/09 0752)  Labs: Recent Labs    08/31/22 0314 08/31/22 0739 09/01/22 0428 09/01/22 1356 09/02/22 0723  HGB 9.2*  --  8.7*  --  9.5*  HCT 31.3*  --  30.6*  --  31.8*  PLT 170  --  160  --  186  HEPARINUNFRC  --    < > 0.20* 0.15* 0.41  CREATININE 3.27*  --  4.59*  --   --    < > = values in this interval not displayed.    ESRD -  usual MWF HD  Assessment: Patient with Afib but not on anticoagulation, now found to have subacute to late stage splenic infarct and potential renal infarct. She was previously on apixaban but held due to rectal ulcers in 12/23. No anticoagulation prior to admission. Pharmacy consulted for heparin.    Heparin level therapeutic    Goal of Therapy:  Heparin level 0.3-0.7 units/ml, but will target lower end of range Monitor platelets by anticoagulation protocol: Yes    Plan: Continue heparin at 1650 units / hr Monitor daily heparin level, CBC Monitor for signs/symptoms of bleeding   Thank you Okey Regal, PharmD  Please check AMION for all [facility] Pharmacy phone numbers After 10:00 PM, call Main Pharmacy [phone]

## 2022-09-02 NOTE — Progress Notes (Addendum)
Rounding Note    Patient Name: Susan [name] Date of Encounter: 09/02/2022  [facility] Cardiologist: Arvilla Meres, MD   Subjective   She feels poor, had dialysis last night, hardly slept. She denied any chest pain. She feels SOB at baseline. She urinates minimally. She has not followed up with AHF clinic since 01/2022 because she was sick and hospitalized and then rehab at [facility]. She is not sure why her previous amiodarone stopped.    Inpatient Medications    Scheduled Meds:  atorvastatin  80 mg Oral Daily   Chlorhexidine Gluconate Cloth  6 each Topical Q0600   insulin aspart  0-5 Units Subcutaneous QHS   insulin aspart  0-6 Units Subcutaneous TID WC   insulin glargine-yfgn  15 Units Subcutaneous Q supper   levothyroxine  75 mcg Oral QAC breakfast   midodrine  5 mg Oral TID WC   pantoprazole  40 mg Oral BID   pregabalin  75 mg Oral Daily   sevelamer carbonate  1,600 mg Oral TID WC   sodium chloride flush  3 mL Intravenous Q12H   vortioxetine HBr  20 mg Oral Daily   Continuous Infusions:  amiodarone 30 mg/hr (09/02/22 0126)   cefTRIAXone (ROCEPHIN)  IV 2 g (09/02/22 0130)   heparin 1,650 Units/hr (09/02/22 0647)   metronidazole 500 mg (09/02/22 0425)   promethazine (PHENERGAN) injection (IM or IVPB) 6.25 mg (08/30/22 1128)   PRN Meds: acetaminophen **OR** acetaminophen, HYDROmorphone (DILAUDID) injection, LORazepam, LORazepam, metoprolol tartrate, prochlorperazine, promethazine **OR** promethazine (PHENERGAN) injection (IM or IVPB) **OR** promethazine   Vital Signs    Vitals:   09/02/22 0300 09/02/22 0400 09/02/22 0500 09/02/22 0752  BP:  (!) 110/59  120/68  Pulse: 95 94 93 94  Resp: (!) 9 (!) 23 13 16   Temp:  97.9 F (36.6 C)  98 F (36.7 C)  TempSrc:  Oral  Oral  SpO2: 100% 99% 100% 100%  Weight:      Height:        Intake/Output Summary (Last 24 hours) at 09/02/2022 1000 Last data filed at 09/02/2022 0001 Gross per 24 hour  Intake --  Output 3000  ml  Net -3000 ml      09/02/2022   12:01 AM 09/01/2022    8:09 PM 08/30/2022    8:56 PM  Last 3 Weights  Weight (lbs) 187 lb 13.3 oz 194 lb 3.6 oz 196 lb 3.4 oz  Weight (kg) 85.2 kg 88.1 kg 89 kg      Telemetry    Atrial flutter 90s - Personally Reviewed  ECG    N/A today - Personally Reviewed  Physical Exam   GEN: No acute distress.   Neck: JVD mid neck while sitting  Cardiac: RRR, no murmurs, rubs, or gallops.  Respiratory: Fine crackles to auscultation bilaterally. ON [place] OXYGEN GI: mild abdominal tenderness  MS: s/p R BKA and LLE 1+ edema Neuro:  Nonfocal Psych: Normal affect   Labs    High Sensitivity Troponin:   Recent Labs  Lab 08/26/22 2247 08/27/22 0115  TROPONINIHS 21* 22*     Chemistry Recent Labs  Lab 08/31/22 0314 09/01/22 0428 09/02/22 0723  NA 128* 127* 129*  K 3.2* 3.7 3.4*  CL 97* 97* 94*  CO2 25 23 23   GLUCOSE 55* 338* 282*  BUN 20 24* 19  CREATININE 3.27* 4.59* 3.44*  CALCIUM 7.9* 7.8* 8.2*  MG 1.7 1.9 1.6*  PROT 5.2* 5.1* 5.7*  ALBUMIN 2.4* 2.2* 2.4*  AST 15 12* 10*  ALT ALKPHOS 111 102 104  BILITOT 0.6 0.5 0.6  GFRNONAA 14* 10* 14*  ANIONGAP Lipids No results for input(s): "CHOL", "TRIG", "HDL", "LABVLDL", "LDLCALC", "CHOLHDL" in the last 168 hours.  Hematology Recent Labs  Lab 08/31/22 0314 09/01/22 0428 09/02/22 0723  WBC 3.9* 3.8* 5.0  RBC 3.93 3.75* 4.04  HGB 9.2* 8.7* 9.5*  HCT 31.3* 30.6* 31.8*  MCV 79.6* 81.6 78.7*  MCH 23.4* 23.2* 23.5*  MCHC 29.4* 28.4* 29.9*  RDW 20.0* 19.9* 19.9*  PLT 170 160 186   Thyroid No results for input(s): "TSH", "FREET4" in the last 168 hours.  BNPNo results for input(s): "BNP", "PROBNP" in the last 168 hours.  DDimer No results for input(s): "DDIMER" in the last 168 hours.   Radiology    DG CHEST PORT 1 VIEW  Result Date: 09/01/2022 CLINICAL DATA:  Shortness of breath EXAM: PORTABLE CHEST 1 VIEW COMPARISON:  06/14/2022 FINDINGS: Cardiac shadow is stable.  Aortic calcifications are seen. Right jugular dialysis catheter is again seen and stable. The lungs are well aerated bilaterally. No focal infiltrate or effusion is seen. No bony abnormality is noted. IMPRESSION: No active disease. Electronically Signed   By: Alcide Clever M.D.   On: 09/01/2022 19:10    Cardiac Studies   Echo 08/30/22:    1. Global hypokinesis with apical akinesis; overall moderate to severe LV  dysfunction.   2. Left ventricular ejection fraction, by estimation, is 30 to 35%. The  left ventricle has moderate to severely decreased function. The left  ventricle demonstrates regional wall motion abnormalities (see scoring  diagram/findings for description). There  is mild left ventricular hypertrophy. Left ventricular diastolic  parameters are indeterminate. Elevated left atrial pressure.   3. Right ventricular systolic function is mildly reduced. The right  ventricular size is moderately enlarged. There is normal pulmonary artery  systolic pressure.   4. Left atrial size was moderately dilated.   5. Right atrial size was mildly dilated.   6. Moderate pleural effusion in the left lateral region.   7. The mitral valve is normal in structure. No evidence of mitral valve  regurgitation. No evidence of mitral stenosis.   8. Tricuspid valve regurgitation is moderate.   9. The aortic valve is normal in structure. Aortic valve regurgitation is  not visualized. Aortic valve sclerosis/calcification is present, without  any evidence of aortic stenosis.  10. The inferior vena cava is normal in size with greater than 50%  respiratory variability, suggesting right atrial pressure of 3 mmHg.     Patient Profile     72 y.o. 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    Assessment & Plan    Wide Complex Tachycardia  - suspected VT versus A flutter RVR with aberrancy  - had palpitations, SOB, diaphoresis, lightheadedness but no syncope or loss of pulse  - Echo this admission showed LVEF of 30-35%  - started on IV amiodarone gtt, no further recurrence so far, may transition to PO amiodarone tomorrow (no AV  nodal agents given hypotension requiring midodrine) - keep K >4 and Mag >2, trend daily labs  - No plans for ischemic work-up at this time as patient is very high risk with comorbidities including recent GIB, EF is stable on TTE - not ICD candidate due to active infection and ESRD with HD catheter    Paroxysmal Atrial Fibrillation/ Flutter - not new diagnosis  - Continue IV Amiodarone as above. - CHA2DS2-VASc = 8 (CAD, CHF, HTN, DM, splenic infarct x2, age, female). She was previously on Eliquis but this was stopped in 05/2022 during admission for GI bleed. However, CTA this admission showed multiple splenic infarcts and renal infarct so she has been restarted on IV Heparin gtt. Will need Eliquis at discharge if hemoglobin remains stable without bleeding   Chronic combined CHF  - Echo 08/30/22 showed LVEF 30-35%,global hypokinesis and apical akinesis, elevated LA pressure, mildly reduced RV, RV moderately enlarged, mod LAE, mild RAE, moderate pleural effusion, moderate TR - CTAP showed anasarca, mesenteric congestive changes, small ascites on 08/30/22 - clinically mildly hypervolemic today, had HD last night, produce minimal urine output at baseline, would not add diuretic at this point   - continue volume management per hemodialysis per nephrology  - GDMT: beta blocker discontinued due to hypotension requiring midodrine,  limited due to ERSD and hypotension   CAD - History of MI in 2000 and in 2007 s/p BMS to RCA 2007. Last Myoview in 2018 was negative for ischemia. - No chest pain.  - Not on ASA PTA with recent GI bleed, DOAC at discharged planned for A fib/flutter, would not add ASA at this time - Continue lipitor 80mg , stopped taking it PTA for unclear reason   Abdominal pain Renal and splenic infarct  Proctitis  ESRD Type II DM - per primary team       For questions or updates, please contact [facility] Please consult [email or web address] for contact info under        Signed, Cyndi BenderXika Zhao, NP  09/02/2022, 10:00 AM    Patient seen and examined and agree with Cyndi BenderXika Zhao, NP as detailed above.   In brief, the patient is a  72 y.o. 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   Patient with complex medical history and is followed by Dr. Gala RomneyBensimhon as outpatient for chronic systolic HF. 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  Presented on this admission for abdominal pain with concern for proctitis. Course was complicated by a wide complex tachycardia with HR sustaining in 180s lasting 10-3215min before slowing to 130-140s (Aflutter with RVR at that point with RBBB) on 08/31/21 for which Cardiology was consulted.   Overall, given age, comorbid conditions including CAD and HF, will treat wide complex tachycardia as episode of VT although may have been Aflutter with aberrancy. Patient was symptomatic  at time of event with palpitations, SOB, diaphoresis, lightheadedness. No syncope or hypotension. Was started on IV amiodarone with no recurrence on telemetry. Likely amiodarone is her best long-term option going forward due to chronic hypotension and  ESRD limiting use of nodal agents and alternative antiarrhythmics. She is currently on heparin gtt for [facility] with goal of resuming apixaban if hemoglobin remains stable. She is not a currently a candidate for ICD/device due to active infection and ESRD with HD line in place. Would also defer ischemic testing for now as EF stable, patient is without anginal symptoms, and has significant comorbities including recent GIB making her very high risk.    GEN: Chronically ill appearing. Fatigued Neck: No JVD.  Cardiac: RR, 2/6 systolic murmur Respiratory: Diminished at the bases GI: Soft, nontender, non-distended  MS: Right BKA, left with trace LE edema Neuro:  Nonfocal  Psych: Normal affect     Plan: -Continue IV amiodarone today with plans to transition to PO amiodarone tomorrow -Holding metop due to hypotension requiring midodrine -Likely PO amio is only good option going forward given hypotension and ESRD limiting use of other agents -On heparin gtt for now with goal to transition to apixaban if hemoglobin stable -No plans for ischemic work-up at this time as patient is very high risk with comorbidities including recent GIB; also has stable EF on TTE and denies any current anginal symptoms -She is not a ICD candidate due to active infection and ESRD requiring HD line     Laurance Flatten, MD

## 2022-09-02 NOTE — H&P (View-Only) (Signed)
Subjective: No change with her abdominal pain and her nausea.  Objective: Vital signs in last 24 hours: Temp:  [97.9 F (36.6 C)-98.3 F (36.8 C)] 98.2 F (36.8 C) (04/09 1057) Pulse Rate:  [87-97] 92 (04/09 1057) Resp:  [6-24] 9 (04/09 1057) BP: (104-136)/(41-70) 110/53 (04/09 1057) SpO2:  [92 %-100 %] 100 % (04/09 1057) Weight:  [85.2 kg-88.1 kg] 85.2 kg (04/09 0001) Last BM Date : 09/01/22  Intake/Output from previous day: 04/08 0701 - 04/09 0700 In: 300 [P.O.:300] Out: 3000  Intake/Output this shift: Total I/O In: 480 [P.O.:480] Out: -   General appearance: alert and moderate distress GI: tender in the epigastrium and the mid abdomen  Lab Results: Recent Labs    08/31/22 0314 09/01/22 0428 09/02/22 0723  WBC 3.9* 3.8* 5.0  HGB 9.2* 8.7* 9.5*  HCT 31.3* 30.6* 31.8*  PLT 170 160 186   BMET Recent Labs    08/31/22 0314 09/01/22 0428 09/02/22 0723  NA 128* 127* 129*  K 3.2* 3.7 3.4*  CL 97* 97* 94*  CO2 25 23 23   GLUCOSE 55* 338* 282*  BUN 20 24* 19  CREATININE 3.27* 4.59* 3.44*  CALCIUM 7.9* 7.8* 8.2*   LFT Recent Labs    09/02/22 0723  PROT 5.7*  ALBUMIN 2.4*  AST 10*  ALT 12  ALKPHOS 104  BILITOT 0.6   PT/INR No results for input(s): "LABPROT", "INR" in the last 72 hours. Hepatitis Panel No results for input(s): "HEPBSAG", "HCVAB", "HEPAIGM", "HEPBIGM" in the last 72 hours. C-Diff No results for input(s): "CDIFFTOX" in the last 72 hours. Fecal Lactopherrin No results for input(s): "FECLLACTOFRN" in the last 72 hours.  Studies/Results: DG CHEST PORT 1 VIEW  Result Date: 09/01/2022 CLINICAL DATA:  Shortness of breath EXAM: PORTABLE CHEST 1 VIEW COMPARISON:  06/14/2022 FINDINGS: Cardiac shadow is stable. Aortic calcifications are seen. Right jugular dialysis catheter is again seen and stable. The lungs are well aerated bilaterally. No focal infiltrate or effusion is seen. No bony abnormality is noted. IMPRESSION: No active disease.  Electronically Signed   By: Alcide Clever M.D.   On: 09/01/2022 19:10    Medications: Scheduled:  atorvastatin  80 mg Oral Daily   Chlorhexidine Gluconate Cloth  6 each Topical Q0600   [START ON 09/03/2022] darbepoetin (ARANESP) injection - DIALYSIS  100 mcg Subcutaneous Q Wed-1800   insulin aspart  0-5 Units Subcutaneous QHS   insulin aspart  0-6 Units Subcutaneous TID WC   insulin glargine-yfgn  15 Units Subcutaneous Q supper   levothyroxine  75 mcg Oral QAC breakfast   midodrine  5 mg Oral TID WC   pantoprazole  40 mg Oral BID   pregabalin  75 mg Oral Daily   sevelamer carbonate  1,600 mg Oral TID WC   sodium chloride flush  3 mL Intravenous Q12H   vortioxetine HBr  20 mg Oral Daily   Continuous:  amiodarone 30 mg/hr (09/02/22 1415)   cefTRIAXone (ROCEPHIN)  IV 2 g (09/02/22 0130)   heparin 1,650 Units/hr (09/02/22 0647)   metronidazole 500 mg (09/02/22 1309)   promethazine (PHENERGAN) injection (IM or IVPB) 6.25 mg (08/30/22 1128)    Assessment/Plan: 1) Persistent abdominal pain. 2) Abnormal CT scan. 3) CHF. 4) Nausea.   There is no change to her symptoms from the GI standpoint.  She continues to experience epigastric pain with radiation down her midline.  The suspicion is that this is secondary to her CHF, however, it is reasonable to perform further evaluation  with an EGD/FFS.  Plan: 1) EGD/FFS tomorrow at 12:30 PM. 2) Tap water enemas. 3) NPO after midnight.  LOS: 6 days   Susan [name] D 09/02/2022, 3:07 PM

## 2022-09-02 NOTE — Progress Notes (Signed)
Subjective: No change with her abdominal pain and her nausea.  Objective: Vital signs in last 24 hours: Temp:  [97.9 F (36.6 C)-98.3 F (36.8 C)] 98.2 F (36.8 C) (04/09 1057) Pulse Rate:  [87-97] 92 (04/09 1057) Resp:  [6-24] 9 (04/09 1057) BP: (104-136)/(41-70) 110/53 (04/09 1057) SpO2:  [92 %-100 %] 100 % (04/09 1057) Weight:  [85.2 kg-88.1 kg] 85.2 kg (04/09 0001) Last BM Date : 09/01/22  Intake/Output from previous day: 04/08 0701 - 04/09 0700 In: 300 [P.O.:300] Out: 3000  Intake/Output this shift: Total I/O In: 480 [P.O.:480] Out: -   General appearance: alert and moderate distress GI: tender in the epigastrium and the mid abdomen  Lab Results: Recent Labs    08/31/22 0314 09/01/22 0428 09/02/22 0723  WBC 3.9* 3.8* 5.0  HGB 9.2* 8.7* 9.5*  HCT 31.3* 30.6* 31.8*  PLT 170 160 186   BMET Recent Labs    08/31/22 0314 09/01/22 0428 09/02/22 0723  NA 128* 127* 129*  K 3.2* 3.7 3.4*  CL 97* 97* 94*  CO2 25 23 23   GLUCOSE 55* 338* 282*  BUN 20 24* 19  CREATININE 3.27* 4.59* 3.44*  CALCIUM 7.9* 7.8* 8.2*   LFT Recent Labs    09/02/22 0723  PROT 5.7*  ALBUMIN 2.4*  AST 10*  ALT 12  ALKPHOS 104  BILITOT 0.6   PT/INR No results for input(s): "LABPROT", "INR" in the last 72 hours. Hepatitis Panel No results for input(s): "HEPBSAG", "HCVAB", "HEPAIGM", "HEPBIGM" in the last 72 hours. C-Diff No results for input(s): "CDIFFTOX" in the last 72 hours. Fecal Lactopherrin No results for input(s): "FECLLACTOFRN" in the last 72 hours.  Studies/Results: DG CHEST PORT 1 VIEW  Result Date: 09/01/2022 CLINICAL DATA:  Shortness of breath EXAM: PORTABLE CHEST 1 VIEW COMPARISON:  06/14/2022 FINDINGS: Cardiac shadow is stable. Aortic calcifications are seen. Right jugular dialysis catheter is again seen and stable. The lungs are well aerated bilaterally. No focal infiltrate or effusion is seen. No bony abnormality is noted. IMPRESSION: No active disease.  Electronically Signed   By: Alcide Clever M.D.   On: 09/01/2022 19:10    Medications: Scheduled:  atorvastatin  80 mg Oral Daily   Chlorhexidine Gluconate Cloth  6 each Topical Q0600   [START ON 09/03/2022] darbepoetin (ARANESP) injection - DIALYSIS  100 mcg Subcutaneous Q Wed-1800   insulin aspart  0-5 Units Subcutaneous QHS   insulin aspart  0-6 Units Subcutaneous TID WC   insulin glargine-yfgn  15 Units Subcutaneous Q supper   levothyroxine  75 mcg Oral QAC breakfast   midodrine  5 mg Oral TID WC   pantoprazole  40 mg Oral BID   pregabalin  75 mg Oral Daily   sevelamer carbonate  1,600 mg Oral TID WC   sodium chloride flush  3 mL Intravenous Q12H   vortioxetine HBr  20 mg Oral Daily   Continuous:  amiodarone 30 mg/hr (09/02/22 1415)   cefTRIAXone (ROCEPHIN)  IV 2 g (09/02/22 0130)   heparin 1,650 Units/hr (09/02/22 0647)   metronidazole 500 mg (09/02/22 1309)   promethazine (PHENERGAN) injection (IM or IVPB) 6.25 mg (08/30/22 1128)    Assessment/Plan: 1) Persistent abdominal pain. 2) Abnormal CT scan. 3) CHF. 4) Nausea.   There is no change to her symptoms from the GI standpoint.  She continues to experience epigastric pain with radiation down her midline.  The suspicion is that this is secondary to her CHF, however, it is reasonable to perform further evaluation  with an EGD/FFS.  Plan: 1) EGD/FFS tomorrow at 12:30 PM. 2) Tap water enemas. 3) NPO after midnight.  LOS: 6 days   Osinachi Navarrette D 09/02/2022, 3:07 PM

## 2022-09-02 NOTE — Progress Notes (Signed)
Heart Failure Navigator Progress Note  Assessed for Heart & Vascular TOC clinic readiness.  Patient does not meet criteria due to ESRD on hemodialysis.   Navigator will sign of at this time.   Rhae Hammock, BSN, Scientist, clinical (histocompatibility and immunogenetics) Only

## 2022-09-03 ENCOUNTER — Inpatient Hospital Stay (HOSPITAL_COMMUNITY): Payer: PPO | Admitting: Certified Registered Nurse Anesthetist

## 2022-09-03 ENCOUNTER — Encounter (HOSPITAL_COMMUNITY)
Admission: EM | Disposition: A | Discharge status: Skilled Nursing Facility | Payer: Self-pay | Source: Home / Self Care | Attending: Family Medicine

## 2022-09-03 ENCOUNTER — Encounter (HOSPITAL_COMMUNITY): Payer: Self-pay | Admitting: Family Medicine

## 2022-09-03 DIAGNOSIS — I2510 Atherosclerotic heart disease of native coronary artery without angina pectoris: Secondary | ICD-10-CM | POA: Diagnosis not present

## 2022-09-03 DIAGNOSIS — R109 Unspecified abdominal pain: Secondary | ICD-10-CM | POA: Diagnosis not present

## 2022-09-03 DIAGNOSIS — R933 Abnormal findings on diagnostic imaging of other parts of digestive tract: Secondary | ICD-10-CM | POA: Diagnosis not present

## 2022-09-03 DIAGNOSIS — K229 Disease of esophagus, unspecified: Secondary | ICD-10-CM

## 2022-09-03 DIAGNOSIS — I480 Paroxysmal atrial fibrillation: Secondary | ICD-10-CM | POA: Diagnosis not present

## 2022-09-03 DIAGNOSIS — E1122 Type 2 diabetes mellitus with diabetic chronic kidney disease: Secondary | ICD-10-CM

## 2022-09-03 DIAGNOSIS — I120 Hypertensive chronic kidney disease with stage 5 chronic kidney disease or end stage renal disease: Secondary | ICD-10-CM | POA: Diagnosis not present

## 2022-09-03 DIAGNOSIS — N186 End stage renal disease: Secondary | ICD-10-CM

## 2022-09-03 DIAGNOSIS — I509 Heart failure, unspecified: Secondary | ICD-10-CM

## 2022-09-03 DIAGNOSIS — R000 Tachycardia, unspecified: Secondary | ICD-10-CM | POA: Diagnosis not present

## 2022-09-03 DIAGNOSIS — I5043 Acute on chronic combined systolic (congestive) and diastolic (congestive) heart failure: Secondary | ICD-10-CM

## 2022-09-03 DIAGNOSIS — Z794 Long term (current) use of insulin: Secondary | ICD-10-CM

## 2022-09-03 HISTORY — PX: FLEXIBLE SIGMOIDOSCOPY: SHX5431

## 2022-09-03 HISTORY — PX: ESOPHAGOGASTRODUODENOSCOPY (EGD) WITH PROPOFOL: SHX5813

## 2022-09-03 LAB — COMPREHENSIVE METABOLIC PANEL
ALT: 9 U/L (ref 0–44)
AST: 9 U/L — ABNORMAL LOW (ref 15–41)
Albumin: 2.3 g/dL — ABNORMAL LOW (ref 3.5–5.0)
Alkaline Phosphatase: 91 U/L (ref 38–126)
Anion gap: 14 (ref 5–15)
BUN: 23 mg/dL (ref 8–23)
CO2: 23 mmol/L (ref 22–32)
Calcium: 8.7 mg/dL — ABNORMAL LOW (ref 8.9–10.3)
Chloride: 95 mmol/L — ABNORMAL LOW (ref 98–111)
Creatinine, Ser: 4.57 mg/dL — ABNORMAL HIGH (ref 0.44–1.00)
GFR, Estimated: 10 mL/min — ABNORMAL LOW (ref >60)
Glucose, Bld: 309 mg/dL — ABNORMAL HIGH (ref 70–99)
Potassium: 3.8 mmol/L (ref 3.5–5.1)
Sodium: 132 mmol/L — ABNORMAL LOW (ref 135–145)
Total Bilirubin: 0.3 mg/dL (ref 0.3–1.2)
Total Protein: 5.4 g/dL — ABNORMAL LOW (ref 6.5–8.1)

## 2022-09-03 LAB — CBC WITH DIFFERENTIAL/PLATELET
Abs Immature Granulocytes: 0.01 10*3/uL (ref 0.00–0.07)
Basophils Absolute: 0 10*3/uL (ref 0.0–0.1)
Basophils Relative: 1 %
Eosinophils Absolute: 0.2 10*3/uL (ref 0.0–0.5)
Eosinophils Relative: 3 %
HCT: 32 % — ABNORMAL LOW (ref 36.0–46.0)
Hemoglobin: 9 g/dL — ABNORMAL LOW (ref 12.0–15.0)
Immature Granulocytes: 0 %
Lymphocytes Relative: 20 %
Lymphs Abs: 0.9 10*3/uL (ref 0.7–4.0)
MCH: 22.8 pg — ABNORMAL LOW (ref 26.0–34.0)
MCHC: 28.1 g/dL — ABNORMAL LOW (ref 30.0–36.0)
MCV: 81.2 fL (ref 80.0–100.0)
Monocytes Absolute: 0.8 10*3/uL (ref 0.1–1.0)
Monocytes Relative: 16 %
Neutro Abs: 2.9 10*3/uL (ref 1.7–7.7)
Neutrophils Relative %: 60 %
Platelets: 200 10*3/uL (ref 150–400)
RBC: 3.94 MIL/uL (ref 3.87–5.11)
RDW: 20 % — ABNORMAL HIGH (ref 11.5–15.5)
WBC: 4.8 10*3/uL (ref 4.0–10.5)
nRBC: 0 % (ref 0.0–0.2)

## 2022-09-03 LAB — GLUCOSE, CAPILLARY
Glucose-Capillary: 282 mg/dL — ABNORMAL HIGH (ref 70–99)
Glucose-Capillary: 288 mg/dL — ABNORMAL HIGH (ref 70–99)
Glucose-Capillary: 308 mg/dL — ABNORMAL HIGH (ref 70–99)
Glucose-Capillary: 340 mg/dL — ABNORMAL HIGH (ref 70–99)
Glucose-Capillary: 361 mg/dL — ABNORMAL HIGH (ref 70–99)
Glucose-Capillary: 364 mg/dL — ABNORMAL HIGH (ref 70–99)
Glucose-Capillary: 417 mg/dL — ABNORMAL HIGH (ref 70–99)

## 2022-09-03 LAB — MAGNESIUM: Magnesium: 1.8 mg/dL (ref 1.7–2.4)

## 2022-09-03 LAB — HEPARIN LEVEL (UNFRACTIONATED): Heparin Unfractionated: 0.19 IU/mL — ABNORMAL LOW (ref 0.30–0.70)

## 2022-09-03 LAB — PHOSPHORUS: Phosphorus: 3.5 mg/dL (ref 2.5–4.6)

## 2022-09-03 SURGERY — ESOPHAGOGASTRODUODENOSCOPY (EGD) WITH PROPOFOL
Anesthesia: Monitor Anesthesia Care

## 2022-09-03 MED ORDER — PROPOFOL 10 MG/ML IV BOLUS
INTRAVENOUS | Status: DC | PRN
  Administered 2022-09-03 (×2): 25 mg via INTRAVENOUS

## 2022-09-03 MED ORDER — PROPOFOL 500 MG/50ML IV EMUL
INTRAVENOUS | Status: DC | PRN
  Administered 2022-09-03: 150 ug/kg/min via INTRAVENOUS

## 2022-09-03 MED ORDER — LIDOCAINE 2% (20 MG/ML) 5 ML SYRINGE
INTRAMUSCULAR | Status: DC | PRN
  Administered 2022-09-03: 100 mg via INTRAVENOUS

## 2022-09-03 MED ORDER — METRONIDAZOLE 500 MG/100ML IV SOLN
500.0000 mg | Freq: Three times a day (TID) | INTRAVENOUS | Status: DC
  Administered 2022-09-03 – 2022-09-04 (×3): 500 mg via INTRAVENOUS
  Filled 2022-09-03 (×3): qty 100

## 2022-09-03 MED ORDER — SODIUM CHLORIDE 0.9 % IV SOLN
2.0000 g | INTRAVENOUS | Status: DC
  Administered 2022-09-03: 2 g via INTRAVENOUS
  Filled 2022-09-03: qty 20

## 2022-09-03 MED ORDER — INSULIN GLARGINE-YFGN 100 UNIT/ML ~~LOC~~ SOLN
15.0000 [IU] | Freq: Two times a day (BID) | SUBCUTANEOUS | Status: DC
  Administered 2022-09-03 – 2022-09-05 (×5): 15 [IU] via SUBCUTANEOUS
  Filled 2022-09-03 (×8): qty 0.15

## 2022-09-03 MED ORDER — HYDROCODONE-ACETAMINOPHEN 5-325 MG PO TABS
1.0000 | ORAL_TABLET | ORAL | Status: DC | PRN
  Administered 2022-09-03 – 2022-09-04 (×4): 1 via ORAL
  Filled 2022-09-03 (×4): qty 1

## 2022-09-03 MED ORDER — SODIUM CHLORIDE 0.9 % IV SOLN: INTRAVENOUS | Status: DC

## 2022-09-03 SURGICAL SUPPLY — 15 items
BLOCK BITE 60FR ADLT L/F BLUE (MISCELLANEOUS) ×1 IMPLANT
ELECT REM PT RETURN 9FT ADLT (ELECTROSURGICAL)
ELECTRODE REM PT RTRN 9FT ADLT (ELECTROSURGICAL) IMPLANT
FORCEP RJ3 GP 1.8X160 W-NEEDLE (CUTTING FORCEPS) IMPLANT
FORCEPS BIOP RAD 4 LRG CAP 4 (CUTTING FORCEPS) IMPLANT
NDL SCLEROTHERAPY 25GX240 (NEEDLE) IMPLANT
NEEDLE SCLEROTHERAPY 25GX240 (NEEDLE) IMPLANT
PROBE APC STR FIRE (PROBE) IMPLANT
PROBE INJECTION GOLD (MISCELLANEOUS)
PROBE INJECTION GOLD 7FR (MISCELLANEOUS) IMPLANT
SNARE SHORT THROW 13M SML OVAL (MISCELLANEOUS) IMPLANT
SYR 50ML LL SCALE MARK (SYRINGE) IMPLANT
TUBING ENDO SMARTCAP PENTAX (MISCELLANEOUS) ×2 IMPLANT
TUBING IRRIGATION ENDOGATOR (MISCELLANEOUS) ×1 IMPLANT
WATER STERILE IRR 1000ML POUR (IV SOLUTION) IMPLANT

## 2022-09-03 NOTE — Anesthesia Postprocedure Evaluation (Signed)
Anesthesia Post Note  Patient: Susan [name]  Procedure(s) Performed: ESOPHAGOGASTRODUODENOSCOPY (EGD) WITH PROPOFOL FLEXIBLE SIGMOIDOSCOPY     Patient location during evaluation: PACU Anesthesia Type: General Level of consciousness: awake and alert Pain management: pain level controlled Vital Signs Assessment: post-procedure vital signs reviewed and stable Respiratory status: spontaneous breathing, nonlabored ventilation and respiratory function stable Cardiovascular status: blood pressure returned to baseline Postop Assessment: no apparent nausea or vomiting Anesthetic complications: no   No notable events documented.  Last Vitals:  Vitals:   09/03/22 1345 09/03/22 1350  BP:  (!) 111/58  Pulse: 89 89  Resp: 14 15  Temp:    SpO2: 91% 91%    Last Pain:  Vitals:   09/03/22 1335  TempSrc: Temporal  PainSc: 0-No pain                 Shanda Howells

## 2022-09-03 NOTE — Progress Notes (Signed)
PROGRESS NOTE                                                                                                                                                                                                             Patient Demographics:    Susan [name], is a 72 y.o. female, DOB - December 20, 1950, JYN:[id number]  Outpatient Primary [name] for the patient is Susan [name]    LOS - 7  Admit date - 08/26/2022    Chief Complaint  Patient presents with   Abdominal Pain       Brief Narrative (HPI from H&P)   72 yo F with ESRD, diastolic HF, peripheral neuropathy, HTN, hypothyroidism, and T2DM presenting with nausea, vomiting, and abdominal pain. These symptoms developed after eating mac and cheese and ham leftover from Easter. Imaging notable for proctitis with mesenteric congestive changes in the lower abdomen and pelvis. Also with notable RUQ TTP, RUQ US shows distended gallbladder with small stones and trace pericholecystic fluid.  Her workup was consistent with proctocolitis, splenic and right renal infarct of unclear etiology, wide-complex tachycardia for which cardiology was consulted presumed to be A-fib with aberrancy versus nonsustained V. tach.  She is so far been seen by cardiology, GI and renal.  Transferred to my care on 09/03/2022 on day 7 of her hospital stay.    Subjective:    Susan [name] today has, No headache, No chest pain, No abdominal pain - No Nausea, No new weakness tingling or numbness, no SOB.   Assessment  & Plan :   Abdominal pain nausea vomiting imaging consistent of of proctocolitis - GI on board, nonspecific gallbladder changes on CT scan for which HIDA was done and unremarkable.  C. difficile stool study likely suggest colonization, per GI she is going for EGD and flex sig on 09/03/2022.  Completing antibiotics after a total of 10 days. Fecal calprotectin, pancreatic elastase per GI pending.  Paroxysmal  atrial fibrillation.  Wide-complex tachycardia while in the hospital -  Susan [name] vas 2 score of greater than 4.  Seen by cardiology, echo shows a EF less than 35%, currently on IV amiodarone, electrolytes stable, plan is to transition to oral amiodarone soon.  Continue heparin drip.  Once stable from GI standpoint will be transition to a DOAC.  QTc has now improved to 442 ms.  ESRD -  Has right IJ dialysis access.  On HD schedule per nephrology.  Volume removal through dialysis.  Right renal infarct, splenic infarct -  In the setting of underlying A-fib.  Anticoagulation as above.  Hypothyroidism -  On Synthroid.  Hypotension - on midodrine.  DM type II -  Long-acting insulin was doubled, sliding scale.  Lab Results  Component Value Date   HGBA1C 7.9 (H) 08/27/2022   CBG (last 3)  Recent Labs    09/03/22 0024 09/03/22 0404 09/03/22 0830  GLUCAP 361* 288* 340*       Condition - Extremely Guarded  Family Communication  :  None present  Code Status :  Full  Consults  :  Cards, GI, Renal  PUD Prophylaxis : PPI   Procedures  :     CT - 1. New wedge-shaped hypoenhancement in the upper to midpole right kidney laterally, most likely a renal infarct, less likely pyelonephritis. 2. Increasingly distended gallbladder with cholelithiasis but no wall thickening or bile duct dilatation. 3. Likely cystitis, seen previously. 4. Cardiomegaly with asymmetric right chamber predominance. Stable small pericardial effusion. 5. Moderate left and small right layering pleural effusions with compressive atelectasis or consolidation left lower lobe. Interstitial edema is no longer seen in the visualized lungs. 6. Stable small pericardial effusion. 7. Stable mild splenomegaly with capsular contraction over a small hypodensity in the superior spleen which most likely either represents a subacute to late stage infarct or resolving infectious process. 8. Continued severe circumferential rectal thickening with  adjacent stranding. This could be due to proctitis or neoplasm. Direct visualization recommended unless already done. 9. Anasarca, mesenteric congestive changes, small amount of ascites similar to 3 days ago. 10. Diverticulosis without diverticulitis. 11. Aortic and coronary artery atherosclerosis. Aortic Atherosclerosis.  TTE - 1. Global hypokinesis with apical akinesis; overall moderate to severe LV dysfunction.  2. Left ventricular ejection fraction, by estimation, is 30 to 35%. The left ventricle has moderate to severely decreased function. The left ventricle demonstrates regional wall motion abnormalities (see scoring diagram/findings for description). There is mild left ventricular hypertrophy. Left ventricular diastolic parameters are indeterminate. Elevated left atrial pressure.  3. Right ventricular systolic function is mildly reduced. The right ventricular size is moderately enlarged. There is normal pulmonary artery systolic pressure.  4. Left atrial size was moderately dilated.  5. Right atrial size was mildly dilated.  6. Moderate pleural effusion in the left lateral region.  7. The mitral valve is normal in structure. No evidence of mitral valve regurgitation. No evidence of mitral stenosis.  8. Tricuspid valve regurgitation is moderate.  9. The aortic valve is normal in structure. Aortic valve regurgitation is not visualized. Aortic valve sclerosis/calcification is present, without any evidence of aortic stenosis. 10. The inferior vena cava is normal in size with greater than 50% respiratory variability, suggesting right atrial pressure of 3 mmHg.      Disposition Plan  :    Status is: Inpatient   DVT Prophylaxis  :    Place and maintain sequential compression device Start: 08/27/22 1147    Lab Results  Component Value Date   PLT 200 09/03/2022    Diet :  Diet Order             Diet NPO time specified  Diet effective now                    Inpatient  Medications  Scheduled Meds:  atorvastatin  80 mg Oral  Daily   Chlorhexidine Gluconate Cloth  6 each Topical Q0600   darbepoetin (ARANESP) injection - DIALYSIS  100 mcg Subcutaneous Q Wed-1800   insulin aspart  0-5 Units Subcutaneous QHS   insulin aspart  0-6 Units Subcutaneous TID WC   insulin glargine-yfgn  15 Units Subcutaneous BID   levothyroxine  75 mcg Oral QAC breakfast   midodrine  5 mg Oral TID WC   pantoprazole  40 mg Oral BID   pregabalin  75 mg Oral Daily   sevelamer carbonate  1,600 mg Oral TID WC   sodium chloride flush  3 mL Intravenous Q12H   vortioxetine HBr  20 mg Oral Daily   Continuous Infusions:  sodium chloride     amiodarone 30 mg/hr (09/03/22 0123)   heparin 1,650 Units/hr (09/02/22 2206)   promethazine (PHENERGAN) injection (IM or IVPB) 6.25 mg (08/30/22 1128)   PRN Meds:.acetaminophen **OR** acetaminophen, HYDROcodone-acetaminophen, LORazepam, metoprolol tartrate, prochlorperazine, promethazine **OR** promethazine (PHENERGAN) injection (IM or IVPB) **OR** promethazine    Objective:   Vitals:   09/03/22 0300 09/03/22 0400 09/03/22 0500 09/03/22 0829  BP:  (!) 106/46  (!) 116/54  Pulse: 89 90 88 89  Resp: 13 (!) Temp:    97.9 F (36.6 C)  TempSrc:    Oral  SpO2: 100% 100% 95% 100%  Weight:      Height:        Wt Readings from Last 3 Encounters:  09/02/22 85.2 kg  08/20/22 102.1 kg  08/18/22 96.6 kg     Intake/Output Summary (Last 24 hours) at 09/03/2022 1000 Last data filed at 09/02/2022 1454 Gross per 24 hour  Intake 480 ml  Output --  Net 480 ml     Physical Exam  Awake Alert, No new F.N deficits, Normal affect Susan [name],PERRAL Supple Neck, No JVD,   Symmetrical Chest wall movement, Good air movement bilaterally, CTAB RRR,No Gallops,Rubs or new Murmurs,  +ve B.Sounds, Abd Soft, No tenderness,   No Cyanosis, Clubbing or edema     RN pressure injury documentation: Pressure Injury 05/23/22 Buttocks Right Deep Tissue  Pressure Injury - Purple or maroon localized area of discolored intact skin or blood-filled blister due to damage of underlying soft tissue from pressure and/or shear. (Active)  05/23/22 1715  Location: Buttocks  Location Orientation: Right  Staging: Deep Tissue Pressure Injury - Purple or maroon localized area of discolored intact skin or blood-filled blister due to damage of underlying soft tissue from pressure and/or shear.  Wound Description (Comments):   Present on Admission: Yes     Pressure Injury 05/23/22 Right;Left;Posterior Stage 1 -  Intact skin with non-blanchable redness of a localized area usually over a bony prominence. (Active)  05/23/22   Location:   Location Orientation: Right;Left;Posterior  Staging: Stage 1 -  Intact skin with non-blanchable redness of a localized area usually over a bony prominence.  Wound Description (Comments):   Present on Admission: Yes     Pressure Injury 08/27/22 Coccyx Mid Stage 2 -  Partial thickness loss of dermis presenting as a shallow open injury with a red, pink wound bed without slough. 80mmx5mmx0 red center unattached edges (Active)  08/27/22 1000  Location: Coccyx  Location Orientation: Mid  Staging: Stage 2 -  Partial thickness loss of dermis presenting as a shallow open injury with a red, pink wound bed without slough.  Wound Description (Comments): 53mmx5mmx0 red center unattached edges  Present on Admission:   Dressing Type Foam - Lift dressing  to assess site every shift 09/02/22 1952      Data Review:    Recent Labs  Lab 08/30/22 0158 08/31/22 0314 09/01/22 0428 09/02/22 0723 09/03/22 0425  WBC 5.0 3.9* 3.8* 5.0 4.8  HGB 9.4* 9.2* 8.7* 9.5* 9.0*  HCT 32.1* 31.3* 30.6* 31.8* 32.0*  PLT 168 170 160 186 200  MCV 79.5* 79.6* 81.6 78.7* 81.2  MCH 23.3* 23.4* 23.2* 23.5* 22.8*  MCHC 29.3* 29.4* 28.4* 29.9* 28.1*  RDW 20.3* 20.0* 19.9* 19.9* 20.0*  LYMPHSABS 0.7 0.8 0.8 0.9 0.9  MONOABS 0.6 0.6 0.6 0.6 0.8  EOSABS 0.2 0.2  0.2 0.2 0.2  BASOSABS 0.1 0.0 0.1 0.0 0.0    Recent Labs  Lab 08/27/22 1053 08/27/22 1313 08/30/22 0158 08/31/22 0314 09/01/22 0428 09/02/22 0723 09/03/22 0425  NA  --    < > 132* 128* 127* 129* 132*  K  --    < > 2.9* 3.2* 3.7 3.4* 3.8  CL  --    < > 97* 97* 97* 94* 95*  CO2  --    < > 24 25 23 23 23   ANIONGAP  --    < > 11 6 7 12 14   GLUCOSE  --    < > 118* 55* 338* 282* 309*  BUN  --    < > 13 20 24* 19 23  CREATININE 3.64*   < > 2.56* 3.27* 4.59* 3.44* 4.57*  AST  --    < > 15 15 12* 10* 9*  ALT  --    < > 13 12 10 12 9   ALKPHOS  --    < > 112 111 102 104 91  BILITOT  --    < > 0.7 0.6 0.5 0.6 0.3  ALBUMIN  --    < > 2.4* 2.4* 2.2* 2.4* 2.3*  CRP  --   --   --   --  0.6  --   --   HGBA1C 7.9*  --   --   --   --   --   --   MG  --   --  1.7 1.7 1.9 1.6* 1.8  CALCIUM  --    < > 8.1* 7.9* 7.8* 8.2* 8.7*   < > = values in this interval not displayed.   Lab Results  Component Value Date   CHOL 92 02/01/2022   HDL 34 (L) 02/01/2022   LDLCALC 29 02/01/2022   TRIG 143 02/01/2022   CHOLHDL 2.7 02/01/2022    Lab Results  Component Value Date   HGBA1C 7.9 (H) 08/27/2022     Radiology Reports DG CHEST PORT 1 VIEW  Result Date: 09/01/2022 CLINICAL DATA:  Shortness of breath EXAM: PORTABLE CHEST 1 VIEW COMPARISON:  06/14/2022 FINDINGS: Cardiac shadow is stable. Aortic calcifications are seen. Right jugular dialysis catheter is again seen and stable. The lungs are well aerated bilaterally. No focal infiltrate or effusion is seen. No bony abnormality is noted. IMPRESSION: No active disease. Electronically Signed   By: Alcide Clever M.D.   On: 09/01/2022 19:10   CT ABDOMEN PELVIS W CONTRAST  Result Date: 08/30/2022 CLINICAL DATA:  Abdominal pain. EXAM: CT ABDOMEN AND PELVIS WITH CONTRAST TECHNIQUE: Multidetector CT imaging of the abdomen and pelvis was performed using the standard protocol following bolus administration of intravenous contrast. RADIATION DOSE REDUCTION: This  exam was performed according to the departmental dose-optimization program which includes automated exposure control, adjustment of the mA and/or kV according to  patient size and/or use of iterative reconstruction technique. CONTRAST:  75mL OMNIPAQUE IOHEXOL 350 MG/ML SOLN COMPARISON:  Recent CTA chest, abdomen and pelvis 08/27/2022, CT chest and upper abdomen without contrast 06/12/2022. FINDINGS: Lower chest: Stable cardiomegaly, today with an asymmetric right chamber predominance. Three-vessel calcific CAD. Infusion catheter again terminates at the superior cavoatrial junction and a small pericardial effusion again collecting to the right anteriorly. Still seen are a moderate sized left and small right layering pleural effusions with compressive atelectasis or consolidation in the basal segments of the left lower lobe. Thin walled posterior basal right lower lobe air cyst is again noted. There previously was interstitial edema in the lungs which is not seen today. The pulmonary veins are no longer dilated. There are calcified right hilar nodes. Hepatobiliary: The gallbladder is increasingly distended up to 12 cm in length. Small stones layer in the proximal lumen. No wall thickening or bile duct dilatation. There are calcified granulomas in the liver but no mass enhancement. Pancreas: No mass, ductal dilatation or inflammatory change. Spleen: Again measuring mildly prominent 14.7 cm AP, capsular contraction again noted over a small hypodensity in the superior spleen which most likely either represents a subacute to late stage infarct or resolving infectious process, and was considerably larger on 06/12/2022. Scattered calcified granulomas with no other focal abnormality. Adrenals/Urinary Tract: Chronic nodular thickening both adrenal glands is unchanged. There are renovascular calcifications. Mild global volume loss of both kidneys is again shown. No stone or urinary obstruction is seen. There is a new  wedge-shaped hypoenhancement in the upper to midpole right kidney laterally (series 4 axial 40 and 41 and coronal reconstruction image 78 of series 7). This is most likely a renal infarct, alternatively could be related to pyelonephritis but there are no adjacent inflammatory changes. The remainder of the kidneys enhance homogeneously. There is faint contrast in the bladder. Mild bladder thickening and perivesical haziness compatible with cystitis continues to be seen. There is no bladder mass. Stomach/Bowel: Contracted stomach. No small bowel obstruction or inflammation is seen. The appendix contains contrast and is normal caliber. There or continued mesenteric congestive changes in the mid to lower abdomen and pelvis. There are colonic diverticula greatest in the sigmoid without a focal diverticulitis. Severe circumferential rectal thickening continues to be seen with adjacent stranding and has been seen on previous studies as well warranting direct visualization to exclude underlying lesion. Vascular/Lymphatic: Aortoiliac and extensive visceral branch vessel atherosclerosis. No AAA. No adenopathy. Reproductive: Status post hysterectomy. No adnexal masses. Other: Small amount of posterior pelvic ascites is again noted, trace fluid in the pericolic gutters and scattered within the mesenteric folds. Body wall anasarca noted moderately and appears similar. No free air, free hemorrhage or abscess. Musculoskeletal: Osteopenia and degenerative skeletal changes. No primary destructive or pathologic process. IMPRESSION: 1. New wedge-shaped hypoenhancement in the upper to midpole right kidney laterally, most likely a renal infarct, less likely pyelonephritis. 2. Increasingly distended gallbladder with cholelithiasis but no wall thickening or bile duct dilatation. 3. Likely cystitis, seen previously. 4. Cardiomegaly with asymmetric right chamber predominance. Stable small pericardial effusion. 5. Moderate left and small  right layering pleural effusions with compressive atelectasis or consolidation left lower lobe. Interstitial edema is no longer seen in the visualized lungs. 6. Stable small pericardial effusion. 7. Stable mild splenomegaly with capsular contraction over a small hypodensity in the superior spleen which most likely either represents a subacute to late stage infarct or resolving infectious process. 8. Continued severe circumferential rectal thickening with  adjacent stranding. This could be due to proctitis or neoplasm. Direct visualization recommended unless already done. 9. Anasarca, mesenteric congestive changes, small amount of ascites similar to 3 days ago. 10. Diverticulosis without diverticulitis. 11. Aortic and coronary artery atherosclerosis. Aortic Atherosclerosis (ICD10-I70.0). Electronically Signed   By: Almira Bar M.D.   On: 08/30/2022 20:52   ECHOCARDIOGRAM COMPLETE  Result Date: 08/30/2022    ECHOCARDIOGRAM REPORT   Patient Name:   YAVONNE BONA Date of Exam: 08/30/2022 Medical Rec #:  [id number]    Height:       66.0 in Accession #:    [id number]   Weight:       196.4 lb Date of Birth:  1951/05/06    BSA:          1.985 m Patient Age:    72 years     BP:           114/61 mmHg Patient Gender: F            HR:           103 bpm. Exam Location:  Inpatient Procedure: 2D Echo, Cardiac Doppler, Color Doppler and Intracardiac            Opacification Agent Indications:    Other abnormalities of the heart R00.8  History:        Patient has prior history of Echocardiogram examinations, most                 recent 02/02/2022. CHF, CAD and Previous Myocardial Infarction,                 Stroke, Arrythmias:Tachycardia and RBBB, Signs/Symptoms:Syncope                 and Shortness of Breath; Risk Factors:Former Smoker,                 Dyslipidemia and Hypertension.  Sonographer:    Aron Baba Referring Phys: [phone] A CALDWELL POWELL JR  Sonographer Comments: Suboptimal apical window. Image acquisition  challenging due to patient body habitus and Image acquisition challenging due to respiratory motion. IMPRESSIONS  1. Global hypokinesis with apical akinesis; overall moderate to severe LV dysfunction.  2. Left ventricular ejection fraction, by estimation, is 30 to 35%. The left ventricle has moderate to severely decreased function. The left ventricle demonstrates regional wall motion abnormalities (see scoring diagram/findings for description). There is mild left ventricular hypertrophy. Left ventricular diastolic parameters are indeterminate. Elevated left atrial pressure.  3. Right ventricular systolic function is mildly reduced. The right ventricular size is moderately enlarged. There is normal pulmonary artery systolic pressure.  4. Left atrial size was moderately dilated.  5. Right atrial size was mildly dilated.  6. Moderate pleural effusion in the left lateral region.  7. The mitral valve is normal in structure. No evidence of mitral valve regurgitation. No evidence of mitral stenosis.  8. Tricuspid valve regurgitation is moderate.  9. The aortic valve is normal in structure. Aortic valve regurgitation is not visualized. Aortic valve sclerosis/calcification is present, without any evidence of aortic stenosis. 10. The inferior vena cava is normal in size with greater than 50% respiratory variability, suggesting right atrial pressure of 3 mmHg. FINDINGS  Left Ventricle: Left ventricular ejection fraction, by estimation, is 30 to 35%. The left ventricle has moderate to severely decreased function. The left ventricle demonstrates regional wall motion abnormalities. Definity contrast agent was given IV to delineate the left ventricular endocardial borders. The left ventricular internal  cavity size was normal in size. There is mild left ventricular hypertrophy. Left ventricular diastolic parameters are indeterminate. Elevated left atrial pressure. Right Ventricle: The right ventricular size is moderately enlarged.  Right ventricular systolic function is mildly reduced. There is normal pulmonary artery systolic pressure. The tricuspid regurgitant velocity is 2.58 m/s, and with an assumed right atrial pressure of 8 mmHg, the estimated right ventricular systolic pressure is 34.6 mmHg. Left Atrium: Left atrial size was moderately dilated. Right Atrium: Right atrial size was mildly dilated. Pericardium: There is no evidence of pericardial effusion. Mitral Valve: The mitral valve is normal in structure. Mild mitral annular calcification. No evidence of mitral valve regurgitation. No evidence of mitral valve stenosis. Tricuspid Valve: The tricuspid valve is normal in structure. Tricuspid valve regurgitation is moderate . No evidence of tricuspid stenosis. Aortic Valve: The aortic valve is normal in structure. Aortic valve regurgitation is not visualized. Aortic valve sclerosis/calcification is present, without any evidence of aortic stenosis. Pulmonic Valve: The pulmonic valve was normal in structure. Pulmonic valve regurgitation is not visualized. No evidence of pulmonic stenosis. Aorta: The aortic root is normal in size and structure. Venous: The inferior vena cava is normal in size with greater than 50% respiratory variability, suggesting right atrial pressure of 3 mmHg. IAS/Shunts: No atrial level shunt detected by color flow Doppler. Additional Comments: Global hypokinesis with apical akinesis; overall moderate to severe LV dysfunction. There is a moderate pleural effusion in the left lateral region.  LEFT VENTRICLE PLAX 2D LVIDd:         5.10 cm   Diastology LVIDs:         4.10 cm   LV e' medial:    2.68 cm/s LV PW:         1.30 cm   LV E/e' medial:  51.9 LV IVS:        1.00 cm   LV e' lateral:   10.80 cm/s LVOT diam:     1.70 cm   LV E/e' lateral: 12.9 LV SV:         39 LV SV Index:   19 LVOT Area:     2.27 cm  RIGHT VENTRICLE RV S prime:     2.92 cm/s TAPSE (M-mode): 1.0 cm LEFT ATRIUM             Index        RIGHT ATRIUM            Index LA diam:        3.60 cm 1.81 cm/m   RA Area:     22.70 cm LA Vol (A2C):   97.5 ml 49.13 ml/m  RA Volume:   66.70 ml  33.61 ml/m LA Vol (A4C):   69.3 ml 34.92 ml/m LA Biplane Vol: 83.3 ml 41.97 ml/m  AORTIC VALVE             PULMONIC VALVE LVOT Vmax:   105.00 cm/s PR End Diast Vel: 3.44 msec LVOT Vmean:  68.700 cm/s LVOT VTI:    0.170 m  AORTA Ao Root diam: 3.40 cm Ao Asc diam:  3.20 cm MITRAL VALVE                TRICUSPID VALVE MV Area (PHT): 5.62 cm     TR Peak grad:   26.6 mmHg MV Decel Time: 135 msec     TR Vmax:        258.00 cm/s MV E velocity: 139.00 cm/s MV A velocity: 36.90 cm/s  SHUNTS MV E/A ratio:  3.77         Systemic VTI:  0.17 m                             Systemic Diam: 1.70 cm Olga Millers [name] Electronically signed by Olga Millers [name] Signature Date/Time: 08/30/2022/2:36:28 PM    Final       Signature  -   Susa Raring M.D on 09/03/2022 at 10:00 AM   -  To page go to [email or web address]

## 2022-09-03 NOTE — Op Note (Signed)
[facility] Patient Name: Susan [name] Procedure Date : 09/03/2022 MRN: [id number] Attending MD: Jeani Hawking , MD, [id number] Date of Birth: December 09, 1950 CSN: [id number] Age: 72 Admit Type: Inpatient Procedure:                Upper GI endoscopy Indications:              Epigastric abdominal pain, Nausea with vomiting Providers:                Jeani Hawking, MD, Fransisca Connors, Kandice Robinsons, Technician Referring MD:              Medicines:                Propofol per Anesthesia Complications:            No immediate complications. Estimated Blood Loss:     Estimated blood loss: none. Procedure:                Pre-Anesthesia Assessment:                           - Prior to the procedure, a History and Physical                            was performed, and patient medications and                            allergies were reviewed. The patient's tolerance of                            previous anesthesia was also reviewed. The risks                            and benefits of the procedure and the sedation                            options and risks were discussed with the patient.                            All questions were answered, and informed consent                            was obtained. Prior Anticoagulants: The patient has                            taken no anticoagulant or antiplatelet agents. ASA                            Grade Assessment: IV - A patient with severe                            systemic disease that is a constant threat to life.  After reviewing the risks and benefits, the patient                            was deemed in satisfactory condition to undergo the                            procedure.                           - Sedation was administered by an anesthesia                            professional. Deep sedation was attained.                           After obtaining informed  consent, the endoscope was                            passed under direct vision. Throughout the                            procedure, the patient's blood pressure, pulse, and                            oxygen saturations were monitored continuously. The                            [id number]-H190 ([id number]) Olympus endoscope was introduced                            through the mouth, and advanced to the second part                            of duodenum. The upper GI endoscopy was                            accomplished without difficulty. The patient                            tolerated the procedure well. Scope In: Scope Out: Findings:      Diffuse, white plaques were found in the entire esophagus.      The stomach was normal.      The examined duodenum was normal. Impression:               - Esophageal plaques were found, consistent with                            candidiasis.                           - Normal stomach.                           - Normal examined duodenum.                           -  No specimens collected. Recommendation:           - Return patient to hospital ward for ongoing care.                           - Resume regular diet.                           - Continue present medications.                           - Nystatin 5 ml QID. (Fluconazole was not possible                            with her history of QTc prolongation). Procedure Code(s):        --- Professional ---                           [phone], Esophagogastroduodenoscopy, flexible,                            transoral; diagnostic, including collection of                            specimen(s) by brushing or washing, when performed                            (separate procedure) Diagnosis Code(s):        --- Professional ---                           K22.9, Disease of esophagus, unspecified                           R10.13, Epigastric pain                           R11.2, Nausea with vomiting,  unspecified CPT copyright 2022 [employer]. All rights reserved. The codes documented in this report are preliminary and upon coder review may  be revised to meet current compliance requirements. Jeani Hawking, MD Jeani Hawking, MD 09/03/2022 1:31:09 PM This report has been signed electronically. Number of Addenda: 0

## 2022-09-03 NOTE — Progress Notes (Signed)
[facility] Progress Note   Subjective:  Seen in room - feels ok this AM. No CP/dyspnea. Going for EGD + sigmoidoscopy today, and then will be dialyzed afterwards.  Objective Vitals:   09/03/22 0300 09/03/22 0400 09/03/22 0500 09/03/22 0829  BP:  (!) 106/46  (!) 116/54  Pulse: 89 90 88 89  Resp: 13 (!) 9 17 10   Temp:    97.9 F (36.6 C)  TempSrc:    Oral  SpO2: 100% 100% 95% 100%  Weight:      Height:       Physical Exam General: Chronically ill appearing woman, NAD. Nasal O2 in place. Heart: RRR; no murmur Lungs: CTA anteriorly Abdomen: mildly tender, but soft Extremities: R BKA; 1+ thigh edema - less in lower leg Dialysis Access: TDC in R chest, LUE AVF maturing + thrill  Additional Objective Labs: Basic Metabolic Panel: Recent Labs  Lab 09/01/22 0428 09/02/22 0723 09/03/22 0425  NA 127* 129* 132*  K 3.7 3.4* 3.8  CL 97* 94* 95*  CO2 23 23 23   GLUCOSE 338* 282* 309*  BUN 24* 19 23  CREATININE 4.59* 3.44* 4.57*  CALCIUM 7.8* 8.2* 8.7*  PHOS 5.1* 3.3 3.5   Liver Function Tests: Recent Labs  Lab 09/01/22 0428 09/02/22 0723 09/03/22 0425  AST 12* 10* 9*  ALT 10 12 9   ALKPHOS 102 104 91  BILITOT 0.5 0.6 0.3  PROT 5.1* 5.7* 5.4*  ALBUMIN 2.2* 2.4* 2.3*   CBC: Recent Labs  Lab 08/30/22 0158 08/31/22 0314 09/01/22 0428 09/02/22 0723 09/03/22 0425  WBC 5.0 3.9* 3.8* 5.0 4.8  NEUTROABS 3.5 2.1 2.2 3.3 2.9  HGB 9.4* 9.2* 8.7* 9.5* 9.0*  HCT 32.1* 31.3* 30.6* 31.8* 32.0*  MCV 79.5* 79.6* 81.6 78.7* 81.2  PLT 168 170 160 186 200   Studies/Results: DG CHEST PORT 1 VIEW  Result Date: 09/01/2022 CLINICAL DATA:  Shortness of breath EXAM: PORTABLE CHEST 1 VIEW COMPARISON:  06/14/2022 FINDINGS: Cardiac shadow is stable. Aortic calcifications are seen. Right jugular dialysis catheter is again seen and stable. The lungs are well aerated bilaterally. No focal infiltrate or effusion is seen. No bony abnormality is noted. IMPRESSION: No active  disease. Electronically Signed   By: Alcide Clever M.D.   On: 09/01/2022 19:10    Medications:  sodium chloride     amiodarone 30 mg/hr (09/03/22 0123)   cefTRIAXone (ROCEPHIN)  IV     heparin 1,650 Units/hr (09/02/22 2206)   metronidazole     promethazine (PHENERGAN) injection (IM or IVPB) 6.25 mg (08/30/22 1128)    atorvastatin  80 mg Oral Daily   Chlorhexidine Gluconate Cloth  6 each Topical Q0600   darbepoetin (ARANESP) injection - DIALYSIS  100 mcg Subcutaneous Q Wed-1800   insulin aspart  0-5 Units Subcutaneous QHS   insulin aspart  0-6 Units Subcutaneous TID WC   insulin glargine-yfgn  15 Units Subcutaneous BID   levothyroxine  75 mcg Oral QAC breakfast   midodrine  5 mg Oral TID WC   pantoprazole  40 mg Oral BID   pregabalin  75 mg Oral Daily   sevelamer carbonate  1,600 mg Oral TID WC   sodium chloride flush  3 mL Intravenous Q12H   vortioxetine HBr  20 mg Oral Daily    Dialysis Orders: MWF SW 4hr, 400/800, EDW 93kg, 2K/2Ca bath, RIJ TDC, Heparin 4000 + - venofer 50mg  IV weekly - mircera 225 mcg IV q 2 wks, last 3/26, due 4/09  Assessment/Plan: Abdominal pain + vomiting + diarrhea: CT 4/3 with proctitis. HIDA scan negative, C.diff negative. GI consulted -> plan for IV Ceftriaxone + Flagyl only at that time. Repeat CT 4/6 with infarcts to spleen, kidney -> now on IV heparin. Going for EGD + sigmoidoscopy today with GI team. Non-sustained VT episode 4/8: Cardiology following, back on amiodarone drip. ESRD: Continue HD on MWF schedule - HD today. Body wall edema on imaging - UF as tolerated. HTN/ volume: BP low/stable on midodrine, anasarca on imaging -> UF as tolerated. She is now below prior EDW, will lower on dischagre.   Anemia of ESRD: Hgb 9 - starting Aranesp q Wednesday. Secondary HPTH: CorrCa/Phos ok - continue Renvela as binder, no VRDA for now.   pA-fib: Prev on amiodarone + Eliquis, both held -> now back on amio + heparin drip. PAD s/p R  BKA T2DM: On insulin, BS high recently.  Ozzie Hoyle, PA-C 09/03/2022, 10:32 AM  [employer]

## 2022-09-03 NOTE — Progress Notes (Addendum)
Rounding Note    Patient Name: Susan DanceSandra [name] Date of Encounter: 09/03/2022  [facility] Cardiologist: Arvilla Meresaniel Bensimhon, MD   Subjective   No chest pain or SOB unaware of rapid atrial fib, though she felt something woke her from sleep this AM  Inpatient Medications    Scheduled Meds:  atorvastatin  80 mg Oral Daily   Chlorhexidine Gluconate Cloth  6 each Topical Q0600   darbepoetin (ARANESP) injection - DIALYSIS  100 mcg Subcutaneous Q Wed-1800   insulin aspart  0-5 Units Subcutaneous QHS   insulin aspart  0-6 Units Subcutaneous TID WC   insulin glargine-yfgn  15 Units Subcutaneous Q supper   levothyroxine  75 mcg Oral QAC breakfast   midodrine  5 mg Oral TID WC   pantoprazole  40 mg Oral BID   pregabalin  75 mg Oral Daily   sevelamer carbonate  1,600 mg Oral TID WC   sodium chloride flush  3 mL Intravenous Q12H   vortioxetine HBr  20 mg Oral Daily   Continuous Infusions:  amiodarone 30 mg/hr (09/03/22 0123)   heparin 1,650 Units/hr (09/02/22 2206)   promethazine (PHENERGAN) injection (IM or IVPB) 6.25 mg (08/30/22 1128)   PRN Meds: acetaminophen **OR** acetaminophen, HYDROmorphone (DILAUDID) injection, LORazepam, LORazepam, metoprolol tartrate, prochlorperazine, promethazine **OR** promethazine (PHENERGAN) injection (IM or IVPB) **OR** promethazine   Vital Signs    Vitals:   09/03/22 0300 09/03/22 0400 09/03/22 0500 09/03/22 0829  BP:  (!) 106/46  (!) 116/54  Pulse: 89 90 88 89  Resp: 13 (!) 9 17 10   Temp:    97.9 F (36.6 C)  TempSrc:    Oral  SpO2: 100% 100% 95% 100%  Weight:      Height:        Intake/Output Summary (Last 24 hours) at 09/03/2022 0916 Last data filed at 09/02/2022 1454 Gross per 24 hour  Intake 480 ml  Output --  Net 480 ml      09/02/2022   12:01 AM 09/01/2022    8:09 PM 08/30/2022    8:56 PM  Last 3 Weights  Weight (lbs) 187 lb 13.3 oz 194 lb 3.6 oz 196 lb 3.4 oz  Weight (kg) 85.2 kg 88.1 kg 89 kg      Telemetry    PAF  around 0500 with RVR,  now rate controlled - Personally Reviewed  ECG    No new - Personally Reviewed  Physical Exam   GEN: No acute distress.   Neck: No JVD Cardiac: RRR, no murmurs, rubs, or gallops.  Respiratory: Clear to auscultation bilaterally. GI: Soft, nontender, non-distended  MS: No edema; No deformity. Neuro:  Nonfocal  Psych: Normal affect   Labs    High Sensitivity Troponin:   Recent Labs  Lab 08/26/22 2247 08/27/22 0115  TROPONINIHS 21* 22*     Chemistry Recent Labs  Lab 09/01/22 0428 09/02/22 0723 09/03/22 0425  NA 127* 129* 132*  K 3.7 3.4* 3.8  CL 97* 94* 95*  CO2 23 23 23   GLUCOSE 338* 282* 309*  BUN 24* 19 23  CREATININE 4.59* 3.44* 4.57*  CALCIUM 7.8* 8.2* 8.7*  MG 1.9 1.6* 1.8  PROT 5.1* 5.7* 5.4*  ALBUMIN 2.2* 2.4* 2.3*  AST 12* 10* 9*  ALT 10 12 9   ALKPHOS 102 104 91  BILITOT 0.5 0.6 0.3  GFRNONAA 10* 14* 10*  ANIONGAP 7 12 14     Lipids No results for input(s): "CHOL", "TRIG", "HDL", "LABVLDL", "LDLCALC", "CHOLHDL" in the  last 168 hours.  Hematology Recent Labs  Lab 09/01/22 0428 09/02/22 0723 09/03/22 0425  WBC 3.8* 5.0 4.8  RBC 3.75* 4.04 3.94  HGB 8.7* 9.5* 9.0*  HCT 30.6* 31.8* 32.0*  MCV 81.6 78.7* 81.2  MCH 23.2* 23.5* 22.8*  MCHC 28.4* 29.9* 28.1*  RDW 19.9* 19.9* 20.0*  PLT 160 186 200   Thyroid No results for input(s): "TSH", "FREET4" in the last 168 hours.  BNPNo results for input(s): "BNP", "PROBNP" in the last 168 hours.  DDimer No results for input(s): "DDIMER" in the last 168 hours.   Radiology    DG CHEST PORT 1 VIEW  Result Date: 09/01/2022 CLINICAL DATA:  Shortness of breath EXAM: PORTABLE CHEST 1 VIEW COMPARISON:  06/14/2022 FINDINGS: Cardiac shadow is stable. Aortic calcifications are seen. Right jugular dialysis catheter is again seen and stable. The lungs are well aerated bilaterally. No focal infiltrate or effusion is seen. No bony abnormality is noted. IMPRESSION: No active disease. Electronically  Signed   By: Alcide Clever M.D.   On: 09/01/2022 19:10    Cardiac Studies   Echo 08/30/22:      1. Global hypokinesis with apical akinesis; overall moderate to severe LV  dysfunction.   2. Left ventricular ejection fraction, by estimation, is 30 to 35%. The  left ventricle has moderate to severely decreased function. The left  ventricle demonstrates regional wall motion abnormalities (see scoring  diagram/findings for description). There  is mild left ventricular hypertrophy. Left ventricular diastolic  parameters are indeterminate. Elevated left atrial pressure.   3. Right ventricular systolic function is mildly reduced. The right  ventricular size is moderately enlarged. There is normal pulmonary artery  systolic pressure.   4. Left atrial size was moderately dilated.   5. Right atrial size was mildly dilated.   6. Moderate pleural effusion in the left lateral region.   7. The mitral valve is normal in structure. No evidence of mitral valve  regurgitation. No evidence of mitral stenosis.   8. Tricuspid valve regurgitation is moderate.   9. The aortic valve is normal in structure. Aortic valve regurgitation is  not visualized. Aortic valve sclerosis/calcification is present, without  any evidence of aortic stenosis.  10. The inferior vena cava is normal in size with greater than 50%  respiratory variability, suggesting right atrial pressure of 3 mmHg.   Patient Profile     72 y.o. 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   Assessment & Plan     Wide Complex Tachycardia  - suspected VT versus A flutter RVR with aberrancy  - had palpitations, SOB, diaphoresis, lightheadedness but no syncope or loss of pulse  - Echo this admission showed LVEF of 30-35%  - started on IV amiodarone gtt, no further recurrence so far, may transition to PO amiodarone tomorrow (no AV nodal agents given hypotension requiring midodrine) PAF this AM will defer to Dr. Shari Prows. - keep K >4 and Mag >2, trend daily labs  (Mg+ 1.8 today k+ 3.,8  Na 132,  - No plans for ischemic work-up at this time as patient is very high  risk with comorbidities including recent GIB, EF is stable on TTE - not ICD candidate due to active infection and ESRD with HD catheter     Paroxysmal Atrial Fibrillation/ Flutter - not new diagnosis  - Continue IV Amiodarone as above. With PAF this AM will defer to Dr. Shari Prows to switch to po today vs tomorrow - CHA2DS2-VASc = 8 (CAD, CHF, HTN, DM, splenic infarct x2, age, female). She was previously on Eliquis but this was stopped in 05/2022 during admission for GI bleed. However, CTA this admission showed multiple splenic infarcts and renal infarct so she has been restarted on IV Heparin gtt. Will need Eliquis at discharge if hemoglobin remains stable without bleeding  -hold metoprolol with hypotension   Chronic combined CHF  - Echo 08/30/22 showed LVEF 30-35%,global hypokinesis and apical akinesis, elevated LA pressure, mildly reduced RV, RV moderately enlarged, mod LAE, mild RAE, moderate pleural effusion, moderate TR - CTAP showed anasarca, mesenteric congestive changes, small ascites on 08/30/22 - clinically mildly hypervolemic today, had HD last night, produce minimal urine output at baseline, would not add diuretic at this point   - continue volume management per hemodialysis per nephrology  - GDMT: beta blocker discontinued due to hypotension requiring midodrine, limited due to ERSD and hypotension    CAD - History of MI in 2000 and in  2007 s/p BMS to RCA 2007. Last Myoview in 2018 was negative for ischemia. - No chest pain.  - Not on ASA PTA with recent GI bleed, DOAC at discharged planned for A fib/flutter, would not add ASA at this time - Continue lipitor 80mg , stopped taking it PTA for unclear reason    Abdominal pain Renal and splenic infarct  Proctitis  ESRD Anemia  Type II DM - per primary team      For questions or updates, please contact [facility] Please consult [email or web address] for contact info under        Signed, Nada Boozer, NP  09/03/2022, 9:16 AM    Patient seen and examined and agree with Nada Boozer, NP as detailed above.   In brief, the patient is a  72 y.o. 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   Patient with complex medical history and is followed by Dr. Gala Romney as outpatient for chronic systolic HF. 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  Presented on this admission for abdominal pain with concern for proctitis. Course was complicated by a wide complex tachycardia with HR sustaining in 180s lasting 10-53min before slowing to 130-140s (Aflutter with RVR at that point with RBBB) on 08/31/21 for which Cardiology was consulted.   Overall, given age, comorbid conditions including CAD and HF, will treat wide complex tachycardia as episode of VT although may have been Aflutter with aberrancy. Patient was symptomatic at time of event with palpitations, SOB, diaphoresis,  lightheadedness. No syncope or hypotension. Was started on IV amiodarone with no recurrence on telemetry. Will continue IV amiodarone today given Aflutter/fib with RVR overnight with plans to transition to PO amiodarone tomorrow. Will continue  amiodarone long-term as she has limited options in the setting of ESRD and hypotension. She is currently on heparin gtt for [facility] with goal of resuming apixaban if hemoglobin remains stable. She is not a candidate for ICD/device due to active infection and ESRD with HD line in place. Would also defer ischemic testing for now as EF stable, patient is without anginal symptoms, and has significant comorbities including recent GIB making her very high risk.    GEN: Chronically ill appearing Neck: No JVD.  Cardiac: RR, 2/6 systolic murmur Respiratory: Diminished at the bases GI: Obese, soft MS: Right BKA, left with trace LE edema Neuro:  Nonfocal  Psych: Normal affect     Plan: -Continue IV amiodarone today with plans to transition to PO amiodarone tomorrow pending HR -Holding metop due to hypotension requiring midodrine -Likely PO amio is only good option going forward given hypotension and ESRD limiting use of other agents -On heparin gtt for now with goal to transition to apixaban if hemoglobin stable -No plans for ischemic work-up at this time as patient is very high risk with comorbidities including recent GIB; also has stable EF on TTE and denies any current anginal symptoms -She is not a ICD candidate due to active infection and ESRD requiring HD line      Laurance Flatten, MD

## 2022-09-03 NOTE — Interval H&P Note (Signed)
History and Physical Interval Note:  09/03/2022 12:24 PM  Susan [name]  has presented today for surgery, with the diagnosis of Abdominal pain and abnormal CT scan.  The various methods of treatment have been discussed with the patient and family. After consideration of risks, benefits and other options for treatment, the patient has consented to  Procedure(s): ESOPHAGOGASTRODUODENOSCOPY (EGD) WITH PROPOFOL (N/A) FLEXIBLE SIGMOIDOSCOPY (N/A) as a surgical intervention.  The patient's history has been reviewed, patient examined, no change in status, stable for surgery.  I have reviewed the patient's chart and labs.  Questions were answered to the patient's satisfaction.     [name]

## 2022-09-03 NOTE — Transfer of Care (Signed)
Immediate Anesthesia Transfer of Care Note  Patient: Susan [name]  Procedure(s) Performed: ESOPHAGOGASTRODUODENOSCOPY (EGD) WITH PROPOFOL FLEXIBLE SIGMOIDOSCOPY  Patient Location: Endoscopy Unit  Anesthesia Type:MAC  Level of Consciousness: awake, alert , and oriented  Airway & Oxygen Therapy: Patient Spontanous Breathing and Patient connected to nasal cannula oxygen  Post-op Assessment: Report given to RN and Post -op Vital signs reviewed and stable  Post vital signs: Reviewed and stable  Last Vitals:  Vitals Value Taken Time  BP    Temp    Pulse 89 09/03/22 1340  Resp 17 09/03/22 1340  SpO2 93 % 09/03/22 1340  Vitals shown include unvalidated device data.  Last Pain:  Vitals:   09/03/22 1335  TempSrc: Temporal  PainSc: 0-No pain      Patients Stated Pain Goal: 0 (08/31/22 1739)  Complications: No notable events documented.

## 2022-09-03 NOTE — Progress Notes (Signed)
ANTICOAGULATION CONSULT NOTE - Follow Up Consult  Pharmacy Consult for Heparin Indication:  Afib with new splenic infarct, and potential renal infarct  Allergies  Allergen Reactions   Cephalexin Diarrhea and Other (See Comments)   Codeine Nausea And Vomiting and Other (See Comments)    Patient Measurements: Height: 5\' 6"  (167.6 cm) Weight: 85.2 kg (187 lb 13.3 oz) IBW/kg (Calculated) : 59.3 Heparin Dosing Weight: 81.8 kg  Vital Signs: Temp: 97.9 F (36.6 C) (04/10 0829) Temp Source: Oral (04/10 0829) BP: 116/54 (04/10 0829) Pulse Rate: 89 (04/10 0829)  Labs: Recent Labs    09/01/22 0428 09/01/22 1356 09/02/22 0723 09/03/22 0425 09/03/22 0821  HGB 8.7*  --  9.5* 9.0*  --   HCT 30.6*  --  31.8* 32.0*  --   PLT 160  --  186 200  --   HEPARINUNFRC 0.20* 0.15* 0.41  --  0.19*  CREATININE 4.59*  --  3.44* 4.57*  --     ESRD -  usual MWF HD  Assessment: Patient with Afib but not on anticoagulation, now found to have subacute to late stage splenic infarct and potential renal infarct. She was previously on apixaban but held due to rectal ulcers in 12/23. No anticoagulation prior to admission. Pharmacy consulted for heparin.    Heparin level 0.19  Goal of Therapy:  Heparin level 0.3-0.7 units/ml, but will target lower end of range Monitor platelets by anticoagulation protocol: Yes    Plan: Increase heparin to 1850 units / hr Monitor daily heparin level, CBC Monitor for signs/symptoms of bleeding   Thank you Okey Regal, PharmD  Please check AMION for all [facility] phone numbers After 10:00 PM, call Main Pharmacy [phone]

## 2022-09-03 NOTE — Op Note (Signed)
[facility] Patient Name: Susan [name] Procedure Date : 09/03/2022 MRN: [id number] Attending MD: Jeani Hawking , MD, [id number] Date of Birth: [birth year] CSN: [id number] Age: 72 Admit Type: Inpatient Procedure:                Flexible Sigmoidoscopy Indications:              Abnormal CT of the GI tract Providers:                Jeani Hawking, MD, Fransisca Connors, Kandice Robinsons, Technician Referring MD:              Medicines:                Propofol per Anesthesia Complications:            No immediate complications. Estimated Blood Loss:     Estimated blood loss: none. Procedure:                Pre-Anesthesia Assessment:                           - Prior to the procedure, a History and Physical                            was performed, and patient medications and                            allergies were reviewed. The patient's tolerance of                            previous anesthesia was also reviewed. The risks                            and benefits of the procedure and the sedation                            options and risks were discussed with the patient.                            All questions were answered, and informed consent                            was obtained. Prior Anticoagulants: The patient has                            taken no anticoagulant or antiplatelet agents. ASA                            Grade Assessment: IV - A patient with severe                            systemic disease that is a constant threat to life.  After reviewing the risks and benefits, the patient                            was deemed in satisfactory condition to undergo the                            procedure.                           - Sedation was administered by an anesthesia                            professional. Deep sedation was attained.                           After obtaining informed consent, the scope  was                            passed under direct vision. The [id number]-[id number] ([id number]                            Olympus endoscope was introduced through the anus                            and advanced to the the sigmoid colon. The flexible                            sigmoidoscopy was accomplished without difficulty.                            The patient tolerated the procedure well. The                            quality of the bowel preparation was adequate. Scope In: 1:12:12 PM Scope Out: 1:13:38 PM Total Procedure Duration: 0 hours 1 minute 26 seconds  Findings:      The rectum, recto-sigmoid colon and sigmoid colon appeared normal. Impression:               - The rectum, sigmoid colon and recto-sigmoid colon                            are normal.                           - No specimens collected. Recommendation:           - Return patient to hospital ward for ongoing care.                           - Resume regular diet.                           - No further evaluation. Procedure Code(s):        --- Professional ---  52841, Sigmoidoscopy, flexible; diagnostic,                            including collection of specimen(s) by brushing or                            washing, when performed (separate procedure) Diagnosis Code(s):        --- Professional ---                           R93.3, Abnormal findings on diagnostic imaging of                            other parts of digestive tract CPT copyright 2022 American Medical Association. All rights reserved. The codes documented in this report are preliminary and upon coder review may  be revised to meet current compliance requirements. Jeani Hawking, MD Jeani Hawking, MD 09/03/2022 1:33:05 PM This report has been signed electronically. Number of Addenda: 0

## 2022-09-03 NOTE — Anesthesia Preprocedure Evaluation (Signed)
Anesthesia Evaluation  Patient identified by MRN, date of birth, ID band Patient awake    Reviewed: Allergy & Precautions, NPO status , Patient's Chart, lab work & pertinent test results  History of Anesthesia Complications (+) PONV and history of anesthetic complications  Airway Mallampati: II  TM Distance: >3 FB Neck ROM: Full    Dental  (+) Missing,    Pulmonary former smoker   Pulmonary exam normal        Cardiovascular hypertension, + CAD, + Past MI, + Cardiac Stents (2007), + Peripheral Vascular Disease and +CHF  Normal cardiovascular exam+ dysrhythmias Atrial Fibrillation   TTE 08/30/22: EF 30-35%, mild LVH, RV function mildly reduced, moderate LAE, mild RAE, moderate pleural effusion in the left lateral region, moderate TR    Neuro/Psych CVA    GI/Hepatic   Endo/Other  diabetes, Type 2, Insulin DependentHypothyroidism    Renal/GU ESRF and DialysisRenal disease     Musculoskeletal  (+) Arthritis ,    Abdominal   Peds  Hematology  (+) Blood dyscrasia (Hgb 9.0), anemia   Anesthesia Other Findings   Reproductive/Obstetrics                              Anesthesia Physical Anesthesia Plan  ASA: 3  Anesthesia Plan: MAC   Post-op Pain Management: Minimal or no pain anticipated   Induction:   PONV Risk Score and Plan: Treatment may vary due to age or medical condition and Propofol infusion  Airway Management Planned: Natural Airway and Nasal Cannula  Additional Equipment: None  Intra-op Plan:   Post-operative Plan:   Informed Consent: I have reviewed the patients History and Physical, chart, labs and discussed the procedure including the risks, benefits and alternatives for the proposed anesthesia with the patient or authorized representative who has indicated his/her understanding and acceptance.       Plan Discussed with: CRNA  Anesthesia Plan Comments:           Anesthesia Quick Evaluation

## 2022-09-04 DIAGNOSIS — I480 Paroxysmal atrial fibrillation: Secondary | ICD-10-CM | POA: Diagnosis not present

## 2022-09-04 DIAGNOSIS — I5043 Acute on chronic combined systolic (congestive) and diastolic (congestive) heart failure: Secondary | ICD-10-CM | POA: Diagnosis not present

## 2022-09-04 DIAGNOSIS — D6869 Other thrombophilia: Secondary | ICD-10-CM | POA: Diagnosis not present

## 2022-09-04 DIAGNOSIS — R109 Unspecified abdominal pain: Secondary | ICD-10-CM | POA: Diagnosis not present

## 2022-09-04 DIAGNOSIS — R000 Tachycardia, unspecified: Secondary | ICD-10-CM | POA: Diagnosis not present

## 2022-09-04 LAB — BASIC METABOLIC PANEL
Anion gap: 12 (ref 5–15)
BUN: 33 mg/dL — ABNORMAL HIGH (ref 8–23)
CO2: 23 mmol/L (ref 22–32)
Calcium: 8.6 mg/dL — ABNORMAL LOW (ref 8.9–10.3)
Chloride: 91 mmol/L — ABNORMAL LOW (ref 98–111)
Creatinine, Ser: 5.33 mg/dL — ABNORMAL HIGH (ref 0.44–1.00)
GFR, Estimated: 8 mL/min — ABNORMAL LOW (ref >60)
Glucose, Bld: 425 mg/dL — ABNORMAL HIGH (ref 70–99)
Potassium: 4 mmol/L (ref 3.5–5.1)
Sodium: 126 mmol/L — ABNORMAL LOW (ref 135–145)

## 2022-09-04 LAB — HEPARIN LEVEL (UNFRACTIONATED): Heparin Unfractionated: <0.1 IU/mL — ABNORMAL LOW (ref 0.30–0.70)

## 2022-09-04 LAB — CBC
HCT: 32.4 % — ABNORMAL LOW (ref 36.0–46.0)
Hemoglobin: 9.3 g/dL — ABNORMAL LOW (ref 12.0–15.0)
MCH: 23.1 pg — ABNORMAL LOW (ref 26.0–34.0)
MCHC: 28.7 g/dL — ABNORMAL LOW (ref 30.0–36.0)
MCV: 80.4 fL (ref 80.0–100.0)
Platelets: 184 10*3/uL (ref 150–400)
RBC: 4.03 MIL/uL (ref 3.87–5.11)
RDW: 20 % — ABNORMAL HIGH (ref 11.5–15.5)
WBC: 4.7 10*3/uL (ref 4.0–10.5)
nRBC: 0 % (ref 0.0–0.2)

## 2022-09-04 LAB — GLUCOSE, CAPILLARY
Glucose-Capillary: 235 mg/dL — ABNORMAL HIGH (ref 70–99)
Glucose-Capillary: 262 mg/dL — ABNORMAL HIGH (ref 70–99)
Glucose-Capillary: 300 mg/dL — ABNORMAL HIGH (ref 70–99)
Glucose-Capillary: 372 mg/dL — ABNORMAL HIGH (ref 70–99)
Glucose-Capillary: 411 mg/dL — ABNORMAL HIGH (ref 70–99)

## 2022-09-04 LAB — MAGNESIUM: Magnesium: 1.9 mg/dL (ref 1.7–2.4)

## 2022-09-04 MED ORDER — APIXABAN 5 MG PO TABS
5.0000 mg | ORAL_TABLET | Freq: Two times a day (BID) | ORAL | Status: DC
  Administered 2022-09-04 – 2022-09-05 (×3): 5 mg via ORAL
  Filled 2022-09-04 (×3): qty 1

## 2022-09-04 MED ORDER — HEPARIN SODIUM (PORCINE) 1000 UNIT/ML DIALYSIS
4000.0000 [IU] | Freq: Once | INTRAMUSCULAR | Status: AC
  Administered 2022-09-04: 4000 [IU] via INTRAVENOUS_CENTRAL

## 2022-09-04 MED ORDER — SODIUM CHLORIDE 0.9 % IV SOLN: 12.5000 mg | Freq: Three times a day (TID) | INTRAVENOUS | Status: DC | PRN

## 2022-09-04 MED ORDER — AMIODARONE HCL 200 MG PO TABS: 200.0000 mg | ORAL_TABLET | Freq: Every day | ORAL | Status: DC

## 2022-09-04 MED ORDER — HEPARIN SODIUM (PORCINE) 1000 UNIT/ML IJ SOLN
INTRAMUSCULAR | Status: AC
  Administered 2022-09-04: 3800 [IU] via INTRAVENOUS_CENTRAL
  Filled 2022-09-04: qty 4

## 2022-09-04 MED ORDER — AMIODARONE HCL 200 MG PO TABS
200.0000 mg | ORAL_TABLET | Freq: Two times a day (BID) | ORAL | Status: DC
  Administered 2022-09-04 – 2022-09-05 (×3): 200 mg via ORAL
  Filled 2022-09-04 (×3): qty 1

## 2022-09-04 MED ORDER — NYSTATIN 100000 UNIT/ML MT SUSP
5.0000 mL | Freq: Four times a day (QID) | OROMUCOSAL | Status: DC
  Administered 2022-09-04 – 2022-09-05 (×4): 500000 [IU] via OROMUCOSAL
  Filled 2022-09-04 (×4): qty 5

## 2022-09-04 NOTE — TOC Progression Note (Signed)
Transition of Care [facility]) - Progression Note    Patient Details  Name: Susan [name] MRN: [id number] Date of Birth: 1950/09/13  Transition of Care [facility]) CM/SW Contact  Mearl Latin, LCSW Phone Number: 09/04/2022, 10:56 AM  Clinical Narrative:    CSW started insurance authorization for [employer].    Expected Discharge Plan: Skilled Nursing Facility Barriers to Discharge: Continued Medical Work up, English as a second language teacher  Expected Discharge Plan and Services       Living arrangements for the past 2 months: Skilled Nursing Facility Expected Discharge Date: 08/30/22                                     Social Determinants of Health (SDOH) Interventions SDOH Screenings   Food Insecurity: No Food Insecurity (08/27/2022)  Housing: Low Risk  (08/27/2022)  Transportation Needs: No Transportation Needs (08/27/2022)  Utilities: Not At Risk (08/27/2022)  Alcohol Screen: Low Risk  (08/16/2021)  Depression (PHQ2-9): Low Risk  (04/23/2022)  Financial Resource Strain: Low Risk  (08/16/2021)  Physical Activity: Inactive (08/16/2021)  Social Connections: Moderately Isolated (08/16/2021)  Stress: No Stress Concern Present (08/16/2021)  Tobacco Use: Medium Risk (09/03/2022)    Readmission Risk Interventions    05/12/2022    5:04 PM 02/07/2022    2:57 PM 01/23/2022   12:38 PM  Readmission Risk Prevention Plan  Transportation Screening Complete Complete Complete  PCP or Specialist Appt within 3-5 Days   Complete  HRI or Home Care Consult   Complete  Social Work Consult for Recovery Care Planning/Counseling   Complete  Palliative Care Screening   Not Applicable  Medication Review Oceanographer)  Complete Complete  PCP or Specialist appointment within 3-5 days of discharge  Complete   HRI or Home Care Consult  Complete   SW Recovery Care/Counseling Consult Complete Complete   Palliative Care Screening  Not Applicable   Skilled Nursing Facility Complete Not Applicable

## 2022-09-04 NOTE — Progress Notes (Signed)
PROGRESS NOTE                                                                                                                                                                                                             Patient Demographics:    Susan [name], is a 72 y.o. female, DOB - Feb 13, 1951, OIT:[id number]  Outpatient Primary MD for the patient is Donita Brooks, MD    LOS - 8  Admit date - 08/26/2022    Chief Complaint  Patient presents with   Abdominal Pain       Brief Narrative (HPI from H&P)   72 yo F with ESRD, diastolic HF, peripheral neuropathy, HTN, hypothyroidism, and T2DM presenting with nausea, vomiting, and abdominal pain. These symptoms developed after eating mac and cheese and ham leftover from Easter. Imaging notable for proctitis with mesenteric congestive changes in the lower abdomen and pelvis. Also with notable RUQ TTP, RUQ US shows distended gallbladder with small stones and trace pericholecystic fluid.  Her workup was consistent with proctocolitis, splenic and right renal infarct of unclear etiology, wide-complex tachycardia for which cardiology was consulted presumed to be A-fib with aberrancy versus nonsustained V. tach.  She is so far been seen by cardiology, GI and renal.  Transferred to my care on 09/03/2022 on day 7 of her hospital stay.    Subjective:    Patient in bed, appears comfortable, denies any headache, no fever, no chest pain or pressure, no shortness of breath , improved abdominal pain. No new focal weakness.   Assessment  & Plan :   Abdominal pain nausea vomiting imaging consistent of of proctocolitis - GI on board, nonspecific gallbladder changes on CT scan for which HIDA was done and unremarkable.  C. difficile stool study likely suggest colonization, she was seen by GI underwent EGD and flex sig on 09/03/2022 showing mild esophageal candidiasis for which she has been started on a  statin and feeling better, case discussed with GI physician Dr. Elnoria Howard in detail, she has completed antibiotic course for colitis.  Continue to monitor.  Paroxysmal atrial fibrillation.  Wide-complex tachycardia while in the hospital -  [place] vas 2 score of greater than 4.  Seen by cardiology, echo shows a EF less than 35%, and by cardiology now transition from IV amiodarone to oral amiodarone on 09/04/2022 along with  IV heparin to Eliquis. QTc has now improved to 442 ms.  ESRD -  Has right IJ dialysis access.  On HD schedule per nephrology.  Volume removal through dialysis.  Right renal infarct, splenic infarct -  In the setting of underlying A-fib.  Anticoagulation as above.  Hypothyroidism -  On Synthroid.  Hypotension - on midodrine.  DM type II -  Long-acting insulin was doubled, sliding scale.  Lab Results  Component Value Date   HGBA1C 7.9 (H) 08/27/2022   CBG (last 3)  Recent Labs    09/03/22 2050 09/04/22 0014 09/04/22 0359  GLUCAP 417* 411* 372*       Condition - Extremely Guarded  Family Communication  :  None present  Code Status :  Full  Consults  :  Cards, GI, Renal  PUD Prophylaxis : PPI   Procedures  :     EGD and flexible sigmoidoscopy 09/03/2022 by Dr. Elnoria Howard.  Possible esophageal candidiasis otherwise unremarkable studies.    CT - 1. New wedge-shaped hypoenhancement in the upper to midpole right kidney laterally, most likely a renal infarct, less likely pyelonephritis. 2. Increasingly distended gallbladder with cholelithiasis but no wall thickening or bile duct dilatation. 3. Likely cystitis, seen previously. 4. Cardiomegaly with asymmetric right chamber predominance. Stable small pericardial effusion. 5. Moderate left and small right layering pleural effusions with compressive atelectasis or consolidation left lower lobe. Interstitial edema is no longer seen in the visualized lungs. 6. Stable small pericardial effusion. 7. Stable mild splenomegaly with capsular  contraction over a small hypodensity in the superior spleen which most likely either represents a subacute to late stage infarct or resolving infectious process. 8. Continued severe circumferential rectal thickening with adjacent stranding. This could be due to proctitis or neoplasm. Direct visualization recommended unless already done. 9. Anasarca, mesenteric congestive changes, small amount of ascites similar to 3 days ago. 10. Diverticulosis without diverticulitis. 11. Aortic and coronary artery atherosclerosis. Aortic Atherosclerosis.  TTE - 1. Global hypokinesis with apical akinesis; overall moderate to severe LV dysfunction.  2. Left ventricular ejection fraction, by estimation, is 30 to 35%. The left ventricle has moderate to severely decreased function. The left ventricle demonstrates regional wall motion abnormalities (see scoring diagram/findings for description). There is mild left ventricular hypertrophy. Left ventricular diastolic parameters are indeterminate. Elevated left atrial pressure.  3. Right ventricular systolic function is mildly reduced. The right ventricular size is moderately enlarged. There is normal pulmonary artery systolic pressure.  4. Left atrial size was moderately dilated.  5. Right atrial size was mildly dilated.  6. Moderate pleural effusion in the left lateral region.  7. The mitral valve is normal in structure. No evidence of mitral valve regurgitation. No evidence of mitral stenosis.  8. Tricuspid valve regurgitation is moderate.  9. The aortic valve is normal in structure. Aortic valve regurgitation is not visualized. Aortic valve sclerosis/calcification is present, without any evidence of aortic stenosis. 10. The inferior vena cava is normal in size with greater than 50% respiratory variability, suggesting right atrial pressure of 3 mmHg.      Disposition Plan  :    Status is: Inpatient   DVT Prophylaxis  :    Place and maintain sequential compression device  Start: 08/27/22 1147 apixaban (ELIQUIS) tablet 5 mg    Lab Results  Component Value Date   PLT 184 09/04/2022    Diet :  Diet Order             DIET SOFT Room  service appropriate? Yes; Fluid consistency: Thin  Diet effective now                    Inpatient Medications  Scheduled Meds:  amiodarone  200 mg Oral BID   [START ON 09/14/2022] amiodarone  200 mg Oral Daily   apixaban  5 mg Oral BID   atorvastatin  80 mg Oral Daily   Chlorhexidine Gluconate Cloth  6 each Topical Q0600   darbepoetin (ARANESP) injection - DIALYSIS  100 mcg Subcutaneous Q Wed-1800   heparin  4,000 Units Dialysis Once in dialysis   insulin aspart  0-5 Units Subcutaneous QHS   insulin aspart  0-6 Units Subcutaneous TID WC   insulin glargine-yfgn  15 Units Subcutaneous BID   levothyroxine  75 mcg Oral QAC breakfast   midodrine  5 mg Oral TID WC   nystatin  5 mL Mouth/Throat QID   pantoprazole  40 mg Oral BID   pregabalin  75 mg Oral Daily   sevelamer carbonate  1,600 mg Oral TID WC   sodium chloride flush  3 mL Intravenous Q12H   vortioxetine HBr  20 mg Oral Daily   Continuous Infusions:  promethazine (PHENERGAN) injection (IM or IVPB)     PRN Meds:.acetaminophen **OR** acetaminophen, HYDROcodone-acetaminophen, LORazepam, metoprolol tartrate, prochlorperazine, promethazine (PHENERGAN) injection (IM or IVPB)    Objective:   Vitals:   09/04/22 0300 09/04/22 0400 09/04/22 0500 09/04/22 0600  BP:  135/81    Pulse: 90 93 94 90  Resp: 14 14 14  (!) 22  Temp:      TempSrc:      SpO2: 100% 100% 100% 100%  Weight:      Height:        Wt Readings from Last 3 Encounters:  09/03/22 85.2 kg  08/20/22 102.1 kg  08/18/22 96.6 kg     Intake/Output Summary (Last 24 hours) at 09/04/2022 0754 Last data filed at 09/03/2022 1326 Gross per 24 hour  Intake 300 ml  Output --  Net 300 ml     Physical Exam  Awake Alert, No new F.N deficits, right IJ HD catheter in place [place].AT,PERRAL Supple  Neck, No JVD,   Symmetrical Chest wall movement, Good air movement bilaterally, CTAB RRR,No Gallops,Rubs or new Murmurs,  +ve B.Sounds, Abd Soft, No tenderness,   No Cyanosis, Clubbing or edema     RN pressure injury documentation: Pressure Injury 05/23/22 Buttocks Right Deep Tissue Pressure Injury - Purple or maroon localized area of discolored intact skin or blood-filled blister due to damage of underlying soft tissue from pressure and/or shear. (Active)  05/23/22 1715  Location: Buttocks  Location Orientation: Right  Staging: Deep Tissue Pressure Injury - Purple or maroon localized area of discolored intact skin or blood-filled blister due to damage of underlying soft tissue from pressure and/or shear.  Wound Description (Comments):   Present on Admission: Yes     Pressure Injury 05/23/22 Right;Left;Posterior Stage 1 -  Intact skin with non-blanchable redness of a localized area usually over a bony prominence. (Active)  05/23/22   Location:   Location Orientation: Right;Left;Posterior  Staging: Stage 1 -  Intact skin with non-blanchable redness of a localized area usually over a bony prominence.  Wound Description (Comments):   Present on Admission: Yes     Pressure Injury 08/27/22 Coccyx Mid Stage 2 -  Partial thickness loss of dermis presenting as a shallow open injury with a red, pink wound bed without slough. 705mmx5mmx0 red center unattached  edges (Active)  08/27/22 1000  Location: Coccyx  Location Orientation: Mid  Staging: Stage 2 -  Partial thickness loss of dermis presenting as a shallow open injury with a red, pink wound bed without slough.  Wound Description (Comments): 40mmx5mmx0 red center unattached edges  Present on Admission:   Dressing Type Foam - Lift dressing to assess site every shift 09/03/22 1942      Data Review:    Recent Labs  Lab 08/30/22 0158 08/31/22 0314 09/01/22 0428 09/02/22 0723 09/03/22 0425 09/04/22 0531  WBC 5.0 3.9* 3.8* 5.0 4.8 4.7   HGB 9.4* 9.2* 8.7* 9.5* 9.0* 9.3*  HCT 32.1* 31.3* 30.6* 31.8* 32.0* 32.4*  PLT 168 170 160 186 200 184  MCV 79.5* 79.6* 81.6 78.7* 81.2 80.4  MCH 23.3* 23.4* 23.2* 23.5* 22.8* 23.1*  MCHC 29.3* 29.4* 28.4* 29.9* 28.1* 28.7*  RDW 20.3* 20.0* 19.9* 19.9* 20.0* 20.0*  LYMPHSABS 0.7 0.8 0.8 0.9 0.9  --   MONOABS 0.6 0.6 0.6 0.6 0.8  --   EOSABS 0.2 0.2 0.2 0.2 0.2  --   BASOSABS 0.1 0.0 0.1 0.0 0.0  --     Recent Labs  Lab 08/30/22 0158 08/31/22 0314 09/01/22 0428 09/02/22 0723 09/03/22 0425  NA 132* 128* 127* 129* 132*  K 2.9* 3.2* 3.7 3.4* 3.8  CL 97* 97* 97* 94* 95*  CO2 24 25 23 23 23   ANIONGAP 11 6 7 12 14   GLUCOSE 118* 55* 338* 282* 309*  BUN 13 20 24* 19 23  CREATININE 2.56* 3.27* 4.59* 3.44* 4.57*  AST 15 15 12* 10* 9*  ALT 13 12 10 12 9   ALKPHOS 112 111 102 104 91  BILITOT 0.7 0.6 0.5 0.6 0.3  ALBUMIN 2.4* 2.4* 2.2* 2.4* 2.3*  CRP  --   --  0.6  --   --   MG 1.7 1.7 1.9 1.6* 1.8  CALCIUM 8.1* 7.9* 7.8* 8.2* 8.7*    Radiology Reports DG CHEST PORT 1 VIEW  Result Date: 09/01/2022 CLINICAL DATA:  Shortness of breath EXAM: PORTABLE CHEST 1 VIEW COMPARISON:  06/14/2022 FINDINGS: Cardiac shadow is stable. Aortic calcifications are seen. Right jugular dialysis catheter is again seen and stable. The lungs are well aerated bilaterally. No focal infiltrate or effusion is seen. No bony abnormality is noted. IMPRESSION: No active disease. Electronically Signed   By: Alcide Clever M.D.   On: 09/01/2022 19:10      Signature  -   Susa Raring M.D on 09/04/2022 at 7:54 AM   -  To page go to [email or web address]

## 2022-09-04 NOTE — Progress Notes (Signed)
Subjective: Feeling better.  She had some suprapubic abdominal pain with dialysis.  Objective: Vital signs in last 24 hours: Temp:  [97.6 F (36.4 C)-98.2 F (36.8 C)] 98.1 F (36.7 C) (04/11 1126) Pulse Rate:  [57-109] 107 (04/11 1126) Resp:  [8-23] 16 (04/11 1126) BP: (101-135)/(52-92) 119/60 (04/11 1126) SpO2:  [65 %-100 %] 100 % (04/11 1126) Weight:  [84 kg-86 kg] 84 kg (04/11 1130) Last BM Date : 09/01/22  Intake/Output from previous day: 04/10 0701 - 04/11 0700 In: 300 [I.V.:300] Out: -  Intake/Output this shift: Total I/O In: -  Out: 2000 [Other:2000]  General appearance: alert and no distress GI: soft, non-tender; bowel sounds normal; no masses,  no organomegaly  Lab Results: Recent Labs    09/02/22 0723 09/03/22 0425 09/04/22 0531  WBC 5.0 4.8 4.7  HGB 9.5* 9.0* 9.3*  HCT 31.8* 32.0* 32.4*  PLT 186 200 184   BMET Recent Labs    09/02/22 0723 09/03/22 0425 09/04/22 0757  NA 129* 132* 126*  K 3.4* 3.8 4.0  CL 94* 95* 91*  CO2 23 23 23   GLUCOSE 282* 309* 425*  BUN 19 23 33*  CREATININE 3.44* 4.57* 5.33*  CALCIUM 8.2* 8.7* 8.6*   LFT Recent Labs    09/03/22 0425  PROT 5.4*  ALBUMIN 2.3*  AST 9*  ALT 9  ALKPHOS 91  BILITOT 0.3   PT/INR No results for input(s): "LABPROT", "INR" in the last 72 hours. Hepatitis Panel No results for input(s): "HEPBSAG", "HCVAB", "HEPAIGM", "HEPBIGM" in the last 72 hours. C-Diff No results for input(s): "CDIFFTOX" in the last 72 hours. Fecal Lactopherrin No results for input(s): "FECLLACTOFRN" in the last 72 hours.  Studies/Results: No results found.  Medications: Scheduled:  amiodarone  200 mg Oral BID   [START ON 09/14/2022] amiodarone  200 mg Oral Daily   apixaban  5 mg Oral BID   atorvastatin  80 mg Oral Daily   Chlorhexidine Gluconate Cloth  6 each Topical Q0600   darbepoetin (ARANESP) injection - DIALYSIS  100 mcg Subcutaneous Q Wed-1800   insulin aspart  0-5 Units Subcutaneous QHS   insulin  aspart  0-6 Units Subcutaneous TID WC   insulin glargine-yfgn  15 Units Subcutaneous BID   levothyroxine  75 mcg Oral QAC breakfast   midodrine  5 mg Oral TID WC   nystatin  5 mL Mouth/Throat QID   pantoprazole  40 mg Oral BID   pregabalin  75 mg Oral Daily   sevelamer carbonate  1,600 mg Oral TID WC   sodium chloride flush  3 mL Intravenous Q12H   vortioxetine HBr  20 mg Oral Daily   Continuous:  promethazine (PHENERGAN) injection (IM or IVPB)      Assessment/Plan: 1) Candidal esophagitis. 2) Suprapubic abdominal pain.   The patient is well.  It is believed that the Candidal esophagitis was driving the majority of her GI symptoms.  She will remain on Nystatin.  As for the suprapubic abdominal pain, the belief is that this is from passive congestion secondary to her CHF.  Plan: 1) Continue with Nystatin for a total of 14 days. 2) Upon discharge follow up with Dr. Loreta Ave. 3) Signing off.  LOS: 8 days   Susan [name] D 09/04/2022, 3:04 PM

## 2022-09-04 NOTE — Progress Notes (Signed)
ANTICOAGULATION CONSULT NOTE - Follow Up Consult  Pharmacy Consult to transition to Eliquis Indication:  Afib with new splenic infarct, and potential renal infarct  Allergies  Allergen Reactions   Cephalexin Diarrhea and Other (See Comments)   Codeine Nausea And Vomiting and Other (See Comments)    Patient Measurements: Height: 5\' 6"  (167.6 cm) Weight: 85.2 kg (187 lb 13.3 oz) IBW/kg (Calculated) : 59.3 Heparin Dosing Weight: 81.8 kg  Vital Signs: Temp: 98.2 F (36.8 C) (04/11 0000) Temp Source: Oral (04/11 0000) BP: 135/81 (04/11 0400) Pulse Rate: 90 (04/11 0600)  Labs: Recent Labs    09/02/22 0723 09/03/22 0425 09/03/22 0821 09/04/22 0531  HGB 9.5* 9.0*  --  9.3*  HCT 31.8* 32.0*  --  32.4*  PLT 186 200  --  184  HEPARINUNFRC 0.41  --  0.19* <0.10*  CREATININE 3.44* 4.57*  --   --     ESRD -  usual MWF HD  Assessment: Patient with Afib but not on anticoagulation, now found to have subacute to late stage splenic infarct and potential renal infarct. She was previously on apixaban but held due to rectal ulcers in 12/23. No anticoagulation prior to admission. Pharmacy consulted transition from heparin to Eliquis. Patient is ESRD on HD. CBC stable. Cardiology and GI ok to resume DOAC, no antiplatelet therapy at this time per cardiology note.  Plan: D/C heparin gtt Start Eliquis 5mg  PO BID Monitor daily heparin level, CBC Monitor for signs/symptoms of bleeding   Ruben Im, PharmD Clinical Pharmacist 09/04/2022 6:42 AM Please check AMION for all [facility] Pharmacy numbers

## 2022-09-04 NOTE — Progress Notes (Addendum)
Rounding Note    Patient Name: Susan [name] Date of Encounter: 09/04/2022  [facility] Cardiologist: Arvilla Meres, MD   Subjective   No chest pain no SOB, on dialysis, resting comfortable  Inpatient Medications    Scheduled Meds:  amiodarone  200 mg Oral BID   [START ON 09/14/2022] amiodarone  200 mg Oral Daily   apixaban  5 mg Oral BID   atorvastatin  80 mg Oral Daily   Chlorhexidine Gluconate Cloth  6 each Topical Q0600   darbepoetin (ARANESP) injection - DIALYSIS  100 mcg Subcutaneous Q Wed-1800   heparin  4,000 Units Dialysis Once in dialysis   insulin aspart  0-5 Units Subcutaneous QHS   insulin aspart  0-6 Units Subcutaneous TID WC   insulin glargine-yfgn  15 Units Subcutaneous BID   levothyroxine  75 mcg Oral QAC breakfast   midodrine  5 mg Oral TID WC   nystatin  5 mL Mouth/Throat QID   pantoprazole  40 mg Oral BID   pregabalin  75 mg Oral Daily   sevelamer carbonate  1,600 mg Oral TID WC   sodium chloride flush  3 mL Intravenous Q12H   vortioxetine HBr  20 mg Oral Daily   Continuous Infusions:  promethazine (PHENERGAN) injection (IM or IVPB)     PRN Meds: acetaminophen **OR** acetaminophen, HYDROcodone-acetaminophen, LORazepam, metoprolol tartrate, prochlorperazine, promethazine (PHENERGAN) injection (IM or IVPB)   Vital Signs    Vitals:   09/04/22 0300 09/04/22 0400 09/04/22 0500 09/04/22 0600  BP:  135/81    Pulse: 90 93 94 90  Resp: 14 14 14  (!) 22  Temp:      TempSrc:      SpO2: 100% 100% 100% 100%  Weight:      Height:        Intake/Output Summary (Last 24 hours) at 09/04/2022 0812 Last data filed at 09/03/2022 1326 Gross per 24 hour  Intake 300 ml  Output --  Net 300 ml      09/03/2022   12:15 PM 09/02/2022   12:01 AM 09/01/2022    8:09 PM  Last 3 Weights  Weight (lbs) 187 lb 13.3 oz 187 lb 13.3 oz 194 lb 3.6 oz  Weight (kg) 85.2 kg 85.2 kg 88.1 kg      Telemetry    Regular, prob SR difficult to see P waves - Personally  Reviewed  ECG    No new - Personally Reviewed  Physical Exam   GEN: No acute distress.   Neck: No JVD Cardiac: RRR, 2/6 systolic murmur,  no rubs, or gallops.  Respiratory: Clear to auscultation bilaterally. Ant. position GI: Soft, nontender, non-distended  MS: some edema Lt lower leg; Rt BKA. Neuro:  Nonfocal  Psych: Normal affect   Labs    High Sensitivity Troponin:   Recent Labs  Lab 08/26/22 2247 08/27/22 0115  TROPONINIHS 21* 22*     Chemistry Recent Labs  Lab 09/01/22 0428 09/02/22 0723 09/03/22 0425  NA 127* 129* 132*  K 3.7 3.4* 3.8  CL 97* 94* 95*  CO2 23 23 23   GLUCOSE 338* 282* 309*  BUN 24* 19 23  CREATININE 4.59* 3.44* 4.57*  CALCIUM 7.8* 8.2* 8.7*  MG 1.9 1.6* 1.8  PROT 5.1* 5.7* 5.4*  ALBUMIN 2.2* 2.4* 2.3*  AST 12* 10* 9*  ALT 10 12 9   ALKPHOS 102 104 91  BILITOT 0.5 0.6 0.3  GFRNONAA 10* 14* 10*  ANIONGAP 7 12 14     Lipids No  results for input(s): "CHOL", "TRIG", "HDL", "LABVLDL", "LDLCALC", "CHOLHDL" in the last 168 hours.  Hematology Recent Labs  Lab 09/02/22 0723 09/03/22 0425 09/04/22 0531  WBC 5.0 4.8 4.7  RBC 4.04 3.94 4.03  HGB 9.5* 9.0* 9.3*  HCT 31.8* 32.0* 32.4*  MCV 78.7* 81.2 80.4  MCH 23.5* 22.8* 23.1*  MCHC 29.9* 28.1* 28.7*  RDW 19.9* 20.0* 20.0*  PLT 186 200 184   Thyroid No results for input(s): "TSH", "FREET4" in the last 168 hours.  BNPNo results for input(s): "BNP", "PROBNP" in the last 168 hours.  DDimer No results for input(s): "DDIMER" in the last 168 hours.   Radiology    No results found.  Cardiac Studies   Echo 08/30/22:      1. Global hypokinesis with apical akinesis; overall moderate to severe LV  dysfunction.   2. Left ventricular ejection fraction, by estimation, is 30 to 35%. The  left ventricle has moderate to severely decreased function. The left  ventricle demonstrates regional wall motion abnormalities (see scoring  diagram/findings for description). There  is mild left ventricular  hypertrophy. Left ventricular diastolic  parameters are indeterminate. Elevated left atrial pressure.   3. Right ventricular systolic function is mildly reduced. The right  ventricular size is moderately enlarged. There is normal pulmonary artery  systolic pressure.   4. Left atrial size was moderately dilated.   5. Right atrial size was mildly dilated.   6. Moderate pleural effusion in the left lateral region.   7. The mitral valve is normal in structure. No evidence of mitral valve  regurgitation. No evidence of mitral stenosis.   8. Tricuspid valve regurgitation is moderate.   9. The aortic valve is normal in structure. Aortic valve regurgitation is  not visualized. Aortic valve sclerosis/calcification is present, without  any evidence of aortic stenosis.  10. The inferior vena cava is normal in size with greater than 50%  respiratory variability, suggesting right atrial pressure of 3 mmHg.   Patient Profile     72 y.o. 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   Assessment & Plan    Wide Complex Tachycardia  - suspected VT versus A flutter RVR with aberrancy  - had palpitations, SOB, diaphoresis, lightheadedness but no syncope or loss of pulse  - Echo this admission showed LVEF of 30-35%  - started on IV amiodarone gtt, no further recurrence so far, may transition to PO amiodarone tomorrow (no AV nodal agents given hypotension requiring midodrine) PAF this AM will defer to Dr. Shari ProwsPemberton. - keep K >4 and Mag >2, trend daily labs  (K+ 4.0 and Na  126,  Mg+ 1.9)    - No plans for ischemic work-up at this time as patient is very high risk with comorbidities including recent GIB, EF is stable on TTE - not ICD candidate due to active infection and ESRD with HD catheter     Paroxysmal Atrial Fibrillation/ Flutter - not new diagnosis  - Continue IV Amiodarone as above. will defer to Dr. Shari ProwsPemberton to switch to po today after dialysis- CHA2DS2-VASc = 8 (CAD, CHF, HTN, DM, splenic  infarct x2, age, female). She was previously on Eliquis but this was stopped in 05/2022 during admission for GI bleed. However, CTA this admission showed multiple splenic infarcts and renal infarct so she has been restarted on IV Heparin gtt. Will need Eliquis at discharge if hemoglobin remains stable without bleeding  -hold metoprolol with hypotension   Chronic combined CHF  - Echo 08/30/22 showed LVEF 30-35%,global hypokinesis and apical akinesis, elevated LA pressure, mildly reduced RV, RV moderately enlarged, mod LAE, mild RAE, moderate pleural effusion, moderate TR - CTAP showed anasarca, mesenteric congestive changes, small ascites on 08/30/22 - clinically mildly hypervolemic today, had HD last night, produce minimal urine output at baseline, would not add diuretic at this point   - continue volume management per hemodialysis per nephrology  - GDMT: beta blocker discontinued due to hypotension requiring midodrine, limited due to ERSD and hypotension    CAD - History of MI in 2000 and in 2007 s/p BMS to RCA 2007. Last Myoview in 2018 was negative for ischemia. - No chest pain.  - Not on ASA PTA with recent GI bleed, DOAC at discharged planned for A fib/flutter, would not add ASA at this time - Continue lipitor 80mg , stopped taking it PTA for unclear reason    Abdominal pain Renal and splenic infarct  Proctitis  ESRD Anemia  Type II DM - per primary team      For questions or updates, please contact [facility] Please consult [email or web address] for  contact info under        Signed, Nada Boozer, NP  09/04/2022, 8:12 AM    Patient seen and examined and agree with Nada Boozer, NP as detailed above.   In brief, the patient is a  72 y.o. 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   Patient with complex medical history and is followed by Dr. Gala Romney as outpatient for chronic systolic HF. 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  Presented on this admission for abdominal pain with concern for proctitis. Course was complicated by a wide complex tachycardia with HR sustaining in 180s lasting 10-5min before slowing to 130-140s (Aflutter with RVR at that point with RBBB) on 08/31/21 for which Cardiology was consulted.   Overall, given age, comorbid conditions including CAD and HF, will treat wide complex tachycardia as episode of VT although may have been Aflutter with aberrancy. Patient was symptomatic at time of event with palpitations, SOB, diaphoresis, lightheadedness. No syncope or hypotension. Was started on IV amiodarone with no recurrence on telemetry. Will transition to PO amiodarone today and plan to continue amiodarone long-term as she has limited options in the setting of ESRD and hypotension. She is currently on heparin gtt for [facility] with goal of resuming apixaban if hemoglobin remains stable. She is not a candidate for ICD/device due to active infection and ESRD with HD line in place. Would also defer ischemic testing for now as EF  stable, patient is without anginal symptoms, and has significant comorbities including recent GIB making her very high risk.  GEN: In HD, chronically ill appearing Neck: No JVD.  Cardiac: Tachycardic, regular, no murmurs Respiratory: Diminished at the bases GI: Obese, soft MS: Right BKA, left with trace LE edema Neuro:  Nonfocal  Psych: Normal affect     Plan: -Transition to amiodarone 200mg  BID x10 days and then 200mg  daily thereafter -Holding metop due to hypotension requiring midodrine -Likely PO amio is only good option going forward given hypotension and ESRD limiting use of other agents -On heparin gtt for now with goal to transition to apixaban if hemoglobin stable -No plans for ischemic work-up at this time as patient is very high risk with comorbidities including recent GIB; also has stable EF on TTE and denies any current anginal symptoms -She is not a ICD candidate due to active infection and ESRD requiring HD line   Cardiology will sign-off. Please do not hesitate to call with questions or concerns.  Laurance Flatten, MD

## 2022-09-04 NOTE — Progress Notes (Signed)
   09/04/22 1126  Vitals  BP 119/60  MAP (mmHg) 76  BP Location Right Arm  BP Method Automatic  Patient Position (if appropriate) Lying  Pulse Rate (!) 107  Pulse Rate Source Monitor  ECG Heart Rate (!) 107  Resp 16  Oxygen Therapy  SpO2 100 %  Post Treatment  Dialyzer Clearance Heavily streaked  Duration of HD Treatment -hour(s) 3.15 hour(s)  Hemodialysis Intake (mL) 0 mL  Liters Processed 71  Fluid Removed (mL) 2000 mL  Tolerated HD Treatment Yes  Post-Hemodialysis Comments tx ended 15 minutes early due to clotting of system  Hemodialysis Catheter Right Subclavian  Placement Date: 06/06/22   Placed prior to admission: Yes  Orientation: Right  Access Location: Subclavian  Site Condition No complications  Blue Lumen Status Flushed;Heparin locked  Red Lumen Status Flushed;Heparin locked  Purple Lumen Status N/A  Catheter fill solution Heparin 1000 units/ml  Catheter fill volume (Arterial) 1.9 cc  Catheter fill volume (Venous) 1.9  Dressing Type Transparent  Dressing Status Antimicrobial disc in place  Interventions New dressing  Drainage Description None  Post treatment catheter status Capped and Clamped   Received patient in bed to unit.  Alert and oriented.  Informed consent signed and in chart.   TX duration:3:15  Patient tolerated well.  Transported back to the room  Alert, without acute distress.  Hand-off given to patient's nurse.   Access used: Rt Hd Catheter Access issues: none  Total UF removed: 2000 Medication(s) given: 5/325  norco Post HD VS: see above Post HD weight: 187.7kg   [facility]

## 2022-09-04 NOTE — Progress Notes (Signed)
Occupational Therapy Treatment Patient Details Name: Susan [name] MRN: [id number] DOB: 06-Aug-1950 Today's Date: 09/04/2022   History of present illness 72 y.o. female [facility] ED [facility] SNF with severe abdominal pain, nausea, and vomiting. Imaging notable for proctitis with mesenteric congestive changes in the lower abdomen and pelvis; also notable RUQ TTP, RUQ US shows distended gall bladder with small stones and trace pericholecystic fluid. Recent thrombectomy of L upper arm arteriovenous fistula/revision of L arm arteriovenous fistula 3/27. PMH significant for MI, CHF, CAD, ESRD, HTN, PAF, DMII, R BKA.   OT comments  Pt progressing toward established OT goals. Seen in collaboration with PTA to address transfers and optimize pt safety. Pt performing bed mobility with up to mod A +2 for truncal elevation. Pt performing lateral scoot transfers with mod-max A +2 with more difficulty returning back to bed due to bed surface slightly higher than chair surface. Pt initially requiring min A for unsupported sitting but progressing to min guard A EOB. Returned to supine with all needs met, will continue to follow.    Recommendations for follow up therapy are one component of a multi-disciplinary discharge planning process, led by the attending physician.  Recommendations may be updated based on patient status, additional functional criteria and insurance authorization.    Assistance Recommended at Discharge Frequent or constant Supervision/Assistance  Patient can return home with the following  Two people to help with walking and/or transfers;A lot of help with bathing/dressing/bathroom;Assistance with cooking/housework;Assist for transportation;Help with stairs or ramp for entrance   Equipment Recommendations  Other (comment) (defer)    Recommendations for Other Services      Precautions / Restrictions Precautions Precautions: Fall Precaution Comments: pt is demonstrating limtations from  skin breakdown on peri area Restrictions Weight Bearing Restrictions: No       Mobility Bed Mobility Overal bed mobility: Needs Assistance Bed Mobility: Rolling, Supine to Sit, Sit to Supine Rolling: Mod assist, Min assist (mod to R, min to L)   Supine to sit: HOB elevated, Min assist, +2 for physical assistance Sit to supine: Mod assist, +2 for physical assistance   General bed mobility comments: assist for trunk support and pivoting bed pads to prevent friction on pt's bottom when going into sitting, assist for BLE management when returning to supine. Rolling with modA with use of bed rail and bed pads today    Transfers Overall transfer level: Needs assistance Equipment used: None (bed pads) Transfers: Bed to chair/wheelchair/BSC            Lateral/Scoot Transfers: Mod assist, Max assist, +2 physical assistance, +2 safety/equipment General transfer comment: mod A +2 to scoot to R from EOB>chair with pt demonstrating good use of UE, max A +2 to scoot chair>EOB as pt with difficulty sequencing and decreased initiation with LLE to elevate hips. cues for hand placement and sequencing throughout     Balance Overall balance assessment: Needs assistance Sitting-balance support: Bilateral upper extremity supported, Feet supported Sitting balance-Leahy Scale: Poor Sitting balance - Comments: increased time to find midline sitting EOB, able to maintain with close min G                                   ADL either performed or assessed with clinical judgement   ADL Overall ADL's : Needs assistance/impaired     Grooming: Min guard;Sitting  Toilet Transfer: Moderate assistance;Maximal assistance;+2 for safety/equipment;+2 for physical assistance;Squat-pivot (lateral scoot) Toilet Transfer Details (indicate cue type and reason): for lateral scoot to chair and back (back toward L, requiring max A+2)         Functional mobility during ADLs:  Moderate assistance;+2 for physical assistance;+2 for safety/equipment;Maximal assistance      Extremity/Trunk Assessment Upper Extremity Assessment Upper Extremity Assessment: Generalized weakness   Lower Extremity Assessment Lower Extremity Assessment: Defer to PT evaluation        Vision   Vision Assessment?: Vision impaired- to be further tested in functional context Additional Comments: Pt reports that smaller items are harder to see   Perception     Praxis      Cognition Arousal/Alertness: Awake/alert Behavior During Therapy: WFL for tasks assessed/performed Overall Cognitive Status: No family/caregiver present to determine baseline cognitive functioning Area of Impairment: Safety/judgement                         Safety/Judgement: Decreased awareness of safety     General Comments: pt requires encouragement for mobility, education for offloading bottom and changing position to help with skin breakdown        Exercises Exercises: General Lower Extremity General Exercises - Lower Extremity Heel Slides: AROM, Left, Supine, 5 reps    Shoulder Instructions       General Comments VSS on supplemental O2 via [place]    Pertinent Vitals/ Pain       Pain Assessment Pain Assessment: Faces Faces Pain Scale: Hurts little more Pain Location: peri area Pain Descriptors / Indicators: Grimacing, Guarding, Aching Pain Intervention(s): Monitored during session, Repositioned, Limited activity within patient's tolerance  Home Living                                          Prior Functioning/Environment              Frequency  Min 2X/week        Progress Toward Goals  OT Goals(current goals can now be found in the care plan section)  Progress towards OT goals: Progressing toward goals  Acute Rehab OT Goals OT Goal Formulation: With patient Time For Goal Achievement: 09/13/22 Potential to Achieve Goals: Good ADL Goals Pt Will  Perform Grooming: with set-up;sitting Pt Will Perform Upper Body Dressing: with set-up;sitting Pt Will Perform Lower Body Dressing: with min assist;sitting/lateral leans Pt/caregiver will Perform Home Exercise Program: Increased strength;Right Upper extremity;Left upper extremity;Both right and left upper extremity;With written HEP provided  Plan Discharge plan remains appropriate;Frequency remains appropriate    Co-evaluation    PT/OT/SLP Co-Evaluation/Treatment: Yes Reason for Co-Treatment: For patient/therapist safety;Complexity of the patient's impairments (multi-system involvement);To address functional/ADL transfers PT goals addressed during session: Mobility/safety with mobility;Balance OT goals addressed during session: ADL's and self-care      AM-PAC OT "6 Clicks" Daily Activity     Outcome Measure   Help from another person eating meals?: None Help from another person taking care of personal grooming?: A Little Help from another person toileting, which includes using toliet, bedpan, or urinal?: Total Help from another person bathing (including washing, rinsing, drying)?: A Lot Help from another person to put on and taking off regular upper body clothing?: A Lot Help from another person to put on and taking off regular lower body clothing?: Total 6 Click Score: 13  End of Session Equipment Utilized During Treatment: Gait belt  OT Visit Diagnosis: Muscle weakness (generalized) (M62.81);Pain   Activity Tolerance Patient tolerated treatment well   Patient Left in bed;with call bell/phone within reach;with bed alarm set   Nurse Communication Mobility status        Time: 1572-6203 OT Time Calculation (min): 40 min  Charges: OT Treatments $Self Care/Home Management : 8-22 mins  Tyler Deis, OTR/L [facility] Acute Rehabilitation Office: [phone]   Susan [name] 09/04/2022, 5:37 PM

## 2022-09-04 NOTE — TOC Initial Note (Signed)
Transition of Care [facility]) - Initial/Assessment Note    Patient Details  Name: Susan [name] MRN: [id number] Date of Birth: 11/23/1950  Transition of Care [facility]) CM/SW Contact:    Mearl Latin, LCSW Phone Number: 09/04/2022, 5:12 PM  Clinical Narrative:                 CSW met with patient to discuss discharge plan. Patient is requesting to return to [facility]. CSW made patient aware that insurance process is underway but that CSW's concern is patient's Medicare may not approve further rehab stay as patient has used many of her SNF days. CSW inquired as to her plan if SNF is denied. She stated she does not know because she cannot ambulate. CSW encouraged her to speak with her family about option of paying privately to stay at [facility] (~$8000-10000/month) versus returning home with family support. Patient reported she does not qualify for Medicaid due to her husband's income (he is still working) and he did not want to sign away his assets. CSW awaiting insurance determination for SNF.    Expected Discharge Plan: Skilled Nursing Facility Barriers to Discharge: Continued Medical Work up, English as a second language teacher   Patient Goals and CMS Choice Patient states their goals for this hospitalization and ongoing recovery are:: Return to SNF CMS Medicare.gov Compare Post Acute Care list provided to:: Patient Choice offered to / list presented to : Patient [place] ownership interest in [facility].provided to:: Patient    Expected Discharge Plan and Services In-house Referral: Clinical Social Work   Post Acute Care Choice: Skilled Nursing Facility Living arrangements for the past 2 months: Skilled Nursing Facility Expected Discharge Date: 08/30/22                                    Prior Living Arrangements/Services Living arrangements for the past 2 months: Skilled Nursing Facility Lives with:: Spouse Patient language and need for interpreter reviewed:: Yes Do you feel  safe going back to the place where you live?: Yes      Need for Family Participation in Patient Care: Yes (Comment) Care giver support system in place?: Yes (comment)   Criminal Activity/Legal Involvement Pertinent to Current Situation/Hospitalization: No - Comment as needed  Activities of Daily Living Home Assistive Devices/Equipment: Oxygen, Hoyer Lift ADL Screening (condition at time of admission) Patient's cognitive ability adequate to safely complete daily activities?: Yes Is the patient deaf or have difficulty hearing?: No Does the patient have difficulty seeing, even when wearing glasses/contacts?: No Does the patient have difficulty concentrating, remembering, or making decisions?: Yes Patient able to express need for assistance with ADLs?: Yes Does the patient have difficulty dressing or bathing?: Yes Independently performs ADLs?: No Communication: Dependent Is this a change from baseline?: Pre-admission baseline Dressing (OT): Dependent Is this a change from baseline?: Pre-admission baseline Grooming: Dependent Is this a change from baseline?: Pre-admission baseline Feeding: Independent Bathing: Dependent Is this a change from baseline?: Pre-admission baseline Toileting: Dependent Is this a change from baseline?: Pre-admission baseline In/Out Bed: Dependent Is this a change from baseline?: Pre-admission baseline Does the patient have difficulty walking or climbing stairs?: Yes Weakness of Legs: Both Weakness of Arms/Hands: Both  Permission Sought/Granted Permission sought to share information with : Facility Medical sales representative, Family Supports Permission granted to share information with : Yes, Verbal Permission Granted     Permission granted to share info w AGENCY: [facility]        Emotional Assessment Appearance:: Appears stated age Attitude/Demeanor/Rapport: Engaged Affect (typically observed): Accepting, Appropriate, Pleasant Orientation: : Oriented to  Self, Oriented to Place, Oriented to  Time, Oriented to Situation Alcohol / Substance Use: Not Applicable Psych Involvement: No (comment)  Admission diagnosis:  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 difficile colitis 06/05/2022   Spleen hematoma 06/05/2022   Dehiscence of amputation stump of right lower extremity 06/05/2022   Rectal ulcer 05/27/2022   ESRD on dialysis 05/27/2022   GI bleed 05/23/2022   Difficult intravenous access 05/23/2022   Gangrene of right foot 05/02/2022   S/P BKA (below knee amputation) unilateral, right 05/02/2022   Unspecified protein-calorie malnutrition 04/15/2022   Secondary hyperparathyroidism of renal origin 04/14/2022   Coagulation defect, unspecified 04/09/2022   Acquired absence of other left toe(s) 04/07/2022   Allergy, unspecified, initial encounter 04/07/2022   Dependence on renal dialysis 04/07/2022   Gout due to renal impairment, unspecified site 04/07/2022   Hypertensive heart and chronic kidney disease with heart failure and with stage 5 chronic kidney disease, or end stage renal disease 04/07/2022   Personal history of transient ischemic attack (TIA), and cerebral infarction without residual deficits 04/07/2022   Renal osteodystrophy 04/07/2022   Venous stasis ulcer of right calf 03/31/2022   Fistula, colovaginal 03/26/2022   Diarrhea 03/26/2022   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   Sepsis without acute organ dysfunction    Bacteremia    Acute pancreatitis 02/01/2022   Abdominal pain 02/01/2022   SIRS  (systemic inflammatory response syndrome) 02/01/2022   Transaminitis 02/01/2022   History of anemia due to chronic kidney disease 02/01/2022   Paroxysmal atrial fibrillation 02/01/2022   DKA (diabetic ketoacidosis) 01/14/2022   NSTEMI (non-ST elevated myocardial infarction) 03/05/2021   Acute renal failure superimposed on stage 4 chronic kidney disease 08/22/2020   Hypoalbuminemia 05/25/2020   GERD (gastroesophageal reflux disease) 05/25/2020   Pressure injury of skin 05/17/2020   Acute on chronic combined systolic and diastolic congestive heart failure 03/07/2020   Type 2 diabetes mellitus with diabetic polyneuropathy, with long-term current use of insulin 03/07/2020   Obesity, Class III, BMI 40-49.9 (morbid obesity) 03/07/2020   Common bile duct (CBD) obstruction 05/28/2019   Benign neoplasm of ascending colon    Benign neoplasm of transverse colon    Benign neoplasm of descending colon    Benign neoplasm of sigmoid colon    Gastric polyps    Hyperkalemia 03/11/2019   Prolonged QT interval 03/11/2019   Acute blood loss anemia 03/11/2019   Onychomycosis 06/21/2018   Osteomyelitis of second toe of right foot    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    PVD (peripheral vascular disease) 10/26/2017   E-coli UTI 07/27/2017   Hypothyroidism 07/27/2017   AKI (acute kidney injury)    PAH (pulmonary artery hypertension)    Impaired ambulation 07/19/2017   Nausea & vomiting 07/15/2017   Leg cramps 02/27/2017   Peripheral edema 01/12/2017   Diabetic neuropathy 11/12/2016   CKD (chronic kidney disease), stage IV 10/24/2015   Anemia of chronic disease 10/03/2015  Generalized anxiety disorder 10/03/2015   Insomnia 10/03/2015   Hyperglycemia due to diabetes mellitus 06/07/2015   Chronic diastolic CHF (congestive heart failure) 06/07/2015   Non compliance with medical treatment 04/17/2014   Rotator cuff tear 03/14/2014   Class 3 obesity 09/23/2013   Chronic HFrEF  (heart failure with reduced ejection fraction) 06/03/2013   Hypotension 12/25/2012   Urinary incontinence    MDD (major depressive disorder) 11/12/2010   RBBB (right bundle branch block)    Wide-complex tachycardia    Coronary artery disease    Hyperlipemia 01/22/2009   Essential hypertension 01/22/2009   PCP:  Donita Brooks, MD Pharmacy:   [facility] [phone] Ginette Otto, [place] - [address] [place] [address] Phone: [phone] Fax: [phone]     Social Determinants of Health (SDOH) Social History: SDOH Screenings   Food Insecurity: No Food Insecurity (08/27/2022)  Housing: Low Risk  (08/27/2022)  Transportation Needs: No Transportation Needs (08/27/2022)  Utilities: Not At Risk (08/27/2022)  Alcohol Screen: Low Risk  (08/16/2021)  Depression (PHQ2-9): Low Risk  (04/23/2022)  Financial Resource Strain: Low Risk  (08/16/2021)  Physical Activity: Inactive (08/16/2021)  Social Connections: Moderately Isolated (08/16/2021)  Stress: No Stress Concern Present (08/16/2021)  Tobacco Use: Medium Risk (09/03/2022)   SDOH Interventions:     Readmission Risk Interventions    09/04/2022    5:11 PM 05/12/2022    5:04 PM 02/07/2022    2:57 PM  Readmission Risk Prevention Plan  Transportation Screening Complete Complete Complete  Medication Review Oceanographer) Complete  Complete  PCP or Specialist appointment within 3-5 days of discharge Complete  Complete  HRI or Home Care Consult Complete  Complete  SW Recovery Care/Counseling Consult Complete Complete Complete  Palliative Care Screening Not Applicable  Not Applicable  Skilled Nursing Facility Complete Complete Not Applicable

## 2022-09-04 NOTE — Procedures (Signed)
I was present at this dialysis session. I have reviewed the session itself and made appropriate changes.   EGD yesterday with esophageal candidiasis.  On nystatin.  Says feels 'better, maybe'.    AM Labs pending.  Hb 9.3 stable. UF goal of 2.5L.  On 3 K bath.   Came to HD yesterday with [facility] accessed, I don't believe we approved of this plan.    She is roll over from yesterday, usually MWF.  Tentative for next Tx 4/13 Sat but if to DC tomorrow we can provide HD tomorrow.   Filed Weights   09/01/22 2009 09/02/22 0001 09/03/22 1215  Weight: 88.1 kg 85.2 kg 85.2 kg    Recent Labs  Lab 09/03/22 0425  NA 132*  K 3.8  CL 95*  CO2 23  GLUCOSE 309*  BUN 23  CREATININE 4.57*  CALCIUM 8.7*  PHOS 3.5    Recent Labs  Lab 09/01/22 0428 09/02/22 0723 09/03/22 0425 09/04/22 0531  WBC 3.8* 5.0 4.8 4.7  NEUTROABS 2.2 3.3 2.9  --   HGB 8.7* 9.5* 9.0* 9.3*  HCT 30.6* 31.8* 32.0* 32.4*  MCV 81.6 78.7* 81.2 80.4  PLT 160 186 200 184    Scheduled Meds:  amiodarone  200 mg Oral BID   [START ON 09/14/2022] amiodarone  200 mg Oral Daily   apixaban  5 mg Oral BID   atorvastatin  80 mg Oral Daily   Chlorhexidine Gluconate Cloth  6 each Topical Q0600   darbepoetin (ARANESP) injection - DIALYSIS  100 mcg Subcutaneous Q Wed-1800   heparin  4,000 Units Dialysis Once in dialysis   insulin aspart  0-5 Units Subcutaneous QHS   insulin aspart  0-6 Units Subcutaneous TID WC   insulin glargine-yfgn  15 Units Subcutaneous BID   levothyroxine  75 mcg Oral QAC breakfast   midodrine  5 mg Oral TID WC   nystatin  5 mL Mouth/Throat QID   pantoprazole  40 mg Oral BID   pregabalin  75 mg Oral Daily   sevelamer carbonate  1,600 mg Oral TID WC   sodium chloride flush  3 mL Intravenous Q12H   vortioxetine HBr  20 mg Oral Daily   Continuous Infusions:  promethazine (PHENERGAN) injection (IM or IVPB)     PRN Meds:.acetaminophen **OR** acetaminophen, HYDROcodone-acetaminophen, LORazepam, metoprolol  tartrate, prochlorperazine, promethazine (PHENERGAN) injection (IM or IVPB)   Sabra Heck  MD 09/04/2022, 8:25 AM

## 2022-09-04 NOTE — Progress Notes (Signed)
Physical Therapy Treatment Patient Details Name: Susan [name] MRN: [id number] DOB: 12-Jun-1950 Today's Date: 09/04/2022   History of Present Illness 72 y.o. female presenting [facility] ED 4/3 from SNF with severe abdominal pain, nausea, and vomiting. Imaging notable for proctitis with mesenteric congestive changes in the lower abdomen and pelvis; also notable RUQ TTP, RUQ US shows distended gall bladder with small stones and trace pericholecystic fluid. Recent thrombectomy of L upper arm arteriovenous fistula/revision of L arm arteriovenous fistula 3/27. PMH significant for MI, CHF, CAD, ESRD, HTN, PAF, DMII, R BKA.    PT Comments    Pt greeted supine in bed and agreeable to PT/OT session for progression of OOB transfers. Pt able to complete lateral scoot transfers EOB<>chair with up to max A +2 as pt demonstrating impaired coordination and decreased UE strength, however pt with good initiation and following all commands throughout trasnfers and with increased motivation for all mobility this session. Pt able to complete bed mobility, rolling and supine<>sit, with min-mod A +2 with pt limited by pain. Pt continues to benefit from skilled PT services to progress toward functional mobility goals.    Recommendations for follow up therapy are one component of a multi-disciplinary discharge planning process, led by the attending physician.  Recommendations may be updated based on patient status, additional functional criteria and insurance authorization.  Follow Up Recommendations  Can patient physically be transported by private vehicle: No    Assistance Recommended at Discharge Frequent or constant Supervision/Assistance  Patient can return home with the following Two people to help with walking and/or transfers;A lot of help with bathing/dressing/bathroom;Assistance with cooking/housework;Direct supervision/assist for medications management;Direct supervision/assist for financial management;Assist for  transportation;Help with stairs or ramp for entrance   Equipment Recommendations  None recommended by PT    Recommendations for Other Services       Precautions / Restrictions Precautions Precautions: Fall Precaution Comments: pt is demonstrating limtations from skin breakdown on peri area Restrictions Weight Bearing Restrictions: No     Mobility  Bed Mobility Overal bed mobility: Needs Assistance Bed Mobility: Rolling, Supine to Sit, Sit to Supine Rolling: Mod assist, Min assist (mod to R, min to L)   Supine to sit: HOB elevated, Min assist, +2 for physical assistance Sit to supine: Mod assist, +2 for physical assistance   General bed mobility comments: assist for trunk support and pivoting bed pads to prevent friction on pt's bottom when going into sitting, assist for BLE management when returning to supine. Rolling with modA with use of bed rail and bed pads today    Transfers Overall transfer level: Needs assistance Equipment used: None (bed pads) Transfers: Bed to chair/wheelchair/BSC            Lateral/Scoot Transfers: Mod assist, Max assist, +2 physical assistance, +2 safety/equipment General transfer comment: mod A +2 to scoot to R from EOB>chair with pt demonstrating good use of UE, max A +2 to scoot chair>EOB as pt with difficulty sequencing and decreased initiation with LLE to elevate hips    Ambulation/Gait                   Stairs             Wheelchair Mobility    Modified Rankin (Stroke Patients Only)       Balance Overall balance assessment: Needs assistance Sitting-balance support: Bilateral upper extremity supported, Feet supported Sitting balance-Leahy Scale: Fair Sitting balance - Comments: increased time to find midline sitting EOB, able to maintain  with close min G                                    Cognition Arousal/Alertness: Awake/alert Behavior During Therapy: WFL for tasks assessed/performed Overall  Cognitive Status: No family/caregiver present to determine baseline cognitive functioning Area of Impairment: Safety/judgement                         Safety/Judgement: Decreased awareness of safety     General Comments: pt requires encouragement for mobility, education for offloading bottom and changing position to help with skin breakdown        Exercises General Exercises - Lower Extremity Heel Slides: AROM, Left, Supine, 5 reps    General Comments General comments (skin integrity, edema, etc.): VSS on supplemental O2      Pertinent Vitals/Pain Pain Assessment Pain Assessment: Faces Faces Pain Scale: Hurts little more Pain Location: peri area Pain Descriptors / Indicators: Grimacing, Guarding, Aching Pain Intervention(s): Monitored during session, Limited activity within patient's tolerance    Home Living                          Prior Function            PT Goals (current goals can now be found in the care plan section) Acute Rehab PT Goals PT Goal Formulation: With patient Time For Goal Achievement: 09/13/22 Progress towards PT goals: Progressing toward goals    Frequency    Min 3X/week      PT Plan Current plan remains appropriate    Co-evaluation PT/OT/SLP Co-Evaluation/Treatment: Yes Reason for Co-Treatment: For patient/therapist safety;Complexity of the patient's impairments (multi-system involvement);To address functional/ADL transfers PT goals addressed during session: Mobility/safety with mobility;Balance        AM-PAC PT "6 Clicks" Mobility   Outcome Measure  Help needed turning from your back to your side while in a flat bed without using bedrails?: A Lot Help needed moving from lying on your back to sitting on the side of a flat bed without using bedrails?: A Lot Help needed moving to and from a bed to a chair (including a wheelchair)?: Total Help needed standing up from a chair using your arms (e.g., wheelchair or  bedside chair)?: Total Help needed to walk in hospital room?: Total Help needed climbing 3-5 steps with a railing? : Total 6 Click Score: 8    End of Session Equipment Utilized During Treatment: Oxygen Activity Tolerance: Patient tolerated treatment well Patient left: in bed;with call bell/phone within reach Nurse Communication: Mobility status PT Visit Diagnosis: Unsteadiness on feet (R26.81);Other abnormalities of gait and mobility (R26.89);Muscle weakness (generalized) (M62.81);Difficulty in walking, not elsewhere classified (R26.2);Pain Pain - Right/Left: Right Pain - part of body: Leg (perineum)     Time: 3846-6599 PT Time Calculation (min) (ACUTE ONLY): 40 min  Charges:  $Therapeutic Activity: 23-37 mins                    Shigeru Lampert R. PTA Acute Rehabilitation Services Office: [phone]    Catalina Antigua 09/04/2022, 4:39 PM

## 2022-09-04 NOTE — TOC Progression Note (Signed)
Transition of Care [facility]) - Progression Note    Patient Details  Name: Susan [name] MRN: [id number] Date of Birth: 1950-08-29  [facility]) CM/SW Contact  Gordy Clement, RN Phone Number: 09/04/2022, 10:17 AM  Clinical Narrative:      CM acknowledges Consult to [facility] for Medication Management and to please provide 1 Month coupon for Eliquis.    Expected Discharge Plan: Skilled Nursing Facility Barriers to Discharge: Continued Medical Work up, English as a second language teacher  Expected Discharge Plan and Services       Living arrangements for the past 2 months: Skilled Nursing Facility Expected Discharge Date: 08/30/22                                     Social Determinants of Health (SDOH) Interventions SDOH Screenings   Food Insecurity: No Food Insecurity (08/27/2022)  Housing: Low Risk  (08/27/2022)  Transportation Needs: No Transportation Needs (08/27/2022)  Utilities: Not At Risk (08/27/2022)  Alcohol Screen: Low Risk  (08/16/2021)  Depression (PHQ2-9): Low Risk  (04/23/2022)  Financial Resource Strain: Low Risk  (08/16/2021)  Physical Activity: Inactive (08/16/2021)  Social Connections: Moderately Isolated (08/16/2021)  Stress: No Stress Concern Present (08/16/2021)  Tobacco Use: Medium Risk (09/03/2022)    Readmission Risk Interventions    05/12/2022    5:04 PM 02/07/2022    2:57 PM 01/23/2022   12:38 PM  Readmission Risk Prevention Plan  Transportation Screening Complete Complete Complete  PCP or Specialist Appt within 3-5 Days   Complete  HRI or Home Care Consult   Complete  Social Work Consult for Recovery Care Planning/Counseling   Complete  Palliative Care Screening   Not Applicable  Medication Review Oceanographer)  Complete Complete  PCP or Specialist appointment within 3-5 days of discharge  Complete   HRI or Home Care Consult  Complete   SW Recovery Care/Counseling Consult Complete Complete   Palliative Care Screening  Not Applicable   Skilled  Nursing Facility Complete Not Applicable

## 2022-09-05 ENCOUNTER — Other Ambulatory Visit: Payer: Self-pay | Admitting: *Deleted

## 2022-09-05 DIAGNOSIS — R109 Unspecified abdominal pain: Secondary | ICD-10-CM | POA: Diagnosis present

## 2022-09-05 DIAGNOSIS — K8020 Calculus of gallbladder without cholecystitis without obstruction: Secondary | ICD-10-CM | POA: Diagnosis not present

## 2022-09-05 DIAGNOSIS — D631 Anemia in chronic kidney disease: Secondary | ICD-10-CM | POA: Diagnosis not present

## 2022-09-05 DIAGNOSIS — I950 Idiopathic hypotension: Secondary | ICD-10-CM | POA: Diagnosis not present

## 2022-09-05 DIAGNOSIS — R1084 Generalized abdominal pain: Secondary | ICD-10-CM | POA: Diagnosis not present

## 2022-09-05 DIAGNOSIS — I959 Hypotension, unspecified: Secondary | ICD-10-CM | POA: Diagnosis not present

## 2022-09-05 DIAGNOSIS — N186 End stage renal disease: Secondary | ICD-10-CM | POA: Diagnosis present

## 2022-09-05 DIAGNOSIS — D689 Coagulation defect, unspecified: Secondary | ICD-10-CM | POA: Diagnosis not present

## 2022-09-05 DIAGNOSIS — I120 Hypertensive chronic kidney disease with stage 5 chronic kidney disease or end stage renal disease: Secondary | ICD-10-CM | POA: Diagnosis not present

## 2022-09-05 DIAGNOSIS — Z79890 Hormone replacement therapy (postmenopausal): Secondary | ICD-10-CM | POA: Diagnosis not present

## 2022-09-05 DIAGNOSIS — E1122 Type 2 diabetes mellitus with diabetic chronic kidney disease: Secondary | ICD-10-CM | POA: Diagnosis not present

## 2022-09-05 DIAGNOSIS — L89153 Pressure ulcer of sacral region, stage 3: Secondary | ICD-10-CM | POA: Diagnosis not present

## 2022-09-05 DIAGNOSIS — R1312 Dysphagia, oropharyngeal phase: Secondary | ICD-10-CM | POA: Diagnosis not present

## 2022-09-05 DIAGNOSIS — M6281 Muscle weakness (generalized): Secondary | ICD-10-CM | POA: Diagnosis not present

## 2022-09-05 DIAGNOSIS — Z23 Encounter for immunization: Secondary | ICD-10-CM | POA: Diagnosis not present

## 2022-09-05 DIAGNOSIS — R197 Diarrhea, unspecified: Secondary | ICD-10-CM | POA: Diagnosis not present

## 2022-09-05 DIAGNOSIS — K8080 Other cholelithiasis without obstruction: Secondary | ICD-10-CM | POA: Diagnosis not present

## 2022-09-05 DIAGNOSIS — Z89511 Acquired absence of right leg below knee: Secondary | ICD-10-CM | POA: Diagnosis not present

## 2022-09-05 DIAGNOSIS — I132 Hypertensive heart and chronic kidney disease with heart failure and with stage 5 chronic kidney disease, or end stage renal disease: Secondary | ICD-10-CM | POA: Diagnosis not present

## 2022-09-05 DIAGNOSIS — I5042 Chronic combined systolic (congestive) and diastolic (congestive) heart failure: Secondary | ICD-10-CM | POA: Diagnosis not present

## 2022-09-05 DIAGNOSIS — Z79899 Other long term (current) drug therapy: Secondary | ICD-10-CM | POA: Diagnosis not present

## 2022-09-05 DIAGNOSIS — Z7901 Long term (current) use of anticoagulants: Secondary | ICD-10-CM | POA: Diagnosis not present

## 2022-09-05 DIAGNOSIS — R41841 Cognitive communication deficit: Secondary | ICD-10-CM | POA: Diagnosis not present

## 2022-09-05 DIAGNOSIS — I739 Peripheral vascular disease, unspecified: Secondary | ICD-10-CM | POA: Diagnosis not present

## 2022-09-05 DIAGNOSIS — I5043 Acute on chronic combined systolic (congestive) and diastolic (congestive) heart failure: Secondary | ICD-10-CM | POA: Diagnosis not present

## 2022-09-05 DIAGNOSIS — Z7984 Long term (current) use of oral hypoglycemic drugs: Secondary | ICD-10-CM | POA: Diagnosis not present

## 2022-09-05 DIAGNOSIS — D509 Iron deficiency anemia, unspecified: Secondary | ICD-10-CM | POA: Diagnosis not present

## 2022-09-05 DIAGNOSIS — E1142 Type 2 diabetes mellitus with diabetic polyneuropathy: Secondary | ICD-10-CM | POA: Diagnosis not present

## 2022-09-05 DIAGNOSIS — I2510 Atherosclerotic heart disease of native coronary artery without angina pectoris: Secondary | ICD-10-CM | POA: Diagnosis not present

## 2022-09-05 DIAGNOSIS — I5032 Chronic diastolic (congestive) heart failure: Secondary | ICD-10-CM | POA: Diagnosis not present

## 2022-09-05 DIAGNOSIS — R251 Tremor, unspecified: Secondary | ICD-10-CM | POA: Diagnosis not present

## 2022-09-05 DIAGNOSIS — I10 Essential (primary) hypertension: Secondary | ICD-10-CM | POA: Diagnosis not present

## 2022-09-05 DIAGNOSIS — E039 Hypothyroidism, unspecified: Secondary | ICD-10-CM | POA: Diagnosis not present

## 2022-09-05 DIAGNOSIS — E1165 Type 2 diabetes mellitus with hyperglycemia: Secondary | ICD-10-CM | POA: Diagnosis not present

## 2022-09-05 DIAGNOSIS — L89223 Pressure ulcer of left hip, stage 3: Secondary | ICD-10-CM | POA: Diagnosis not present

## 2022-09-05 DIAGNOSIS — E1310 Other specified diabetes mellitus with ketoacidosis without coma: Secondary | ICD-10-CM | POA: Diagnosis not present

## 2022-09-05 DIAGNOSIS — I69828 Other speech and language deficits following other cerebrovascular disease: Secondary | ICD-10-CM | POA: Diagnosis not present

## 2022-09-05 DIAGNOSIS — R609 Edema, unspecified: Secondary | ICD-10-CM | POA: Diagnosis not present

## 2022-09-05 DIAGNOSIS — R110 Nausea: Secondary | ICD-10-CM | POA: Diagnosis not present

## 2022-09-05 DIAGNOSIS — N250 Renal osteodystrophy: Secondary | ICD-10-CM | POA: Diagnosis not present

## 2022-09-05 DIAGNOSIS — N2581 Secondary hyperparathyroidism of renal origin: Secondary | ICD-10-CM | POA: Diagnosis not present

## 2022-09-05 DIAGNOSIS — J9611 Chronic respiratory failure with hypoxia: Secondary | ICD-10-CM | POA: Diagnosis not present

## 2022-09-05 DIAGNOSIS — R531 Weakness: Secondary | ICD-10-CM | POA: Diagnosis not present

## 2022-09-05 DIAGNOSIS — Z7401 Bed confinement status: Secondary | ICD-10-CM | POA: Diagnosis not present

## 2022-09-05 DIAGNOSIS — R112 Nausea with vomiting, unspecified: Secondary | ICD-10-CM | POA: Diagnosis not present

## 2022-09-05 DIAGNOSIS — R2689 Other abnormalities of gait and mobility: Secondary | ICD-10-CM | POA: Diagnosis not present

## 2022-09-05 DIAGNOSIS — I480 Paroxysmal atrial fibrillation: Secondary | ICD-10-CM | POA: Diagnosis not present

## 2022-09-05 DIAGNOSIS — Z992 Dependence on renal dialysis: Secondary | ICD-10-CM | POA: Diagnosis present

## 2022-09-05 DIAGNOSIS — Z794 Long term (current) use of insulin: Secondary | ICD-10-CM | POA: Diagnosis not present

## 2022-09-05 LAB — RENAL FUNCTION PANEL
Albumin: 2.2 g/dL — ABNORMAL LOW (ref 3.5–5.0)
Anion gap: 9 (ref 5–15)
BUN: 26 mg/dL — ABNORMAL HIGH (ref 8–23)
CO2: 26 mmol/L (ref 22–32)
Calcium: 8.5 mg/dL — ABNORMAL LOW (ref 8.9–10.3)
Chloride: 95 mmol/L — ABNORMAL LOW (ref 98–111)
Creatinine, Ser: 4.56 mg/dL — ABNORMAL HIGH (ref 0.44–1.00)
GFR, Estimated: 10 mL/min — ABNORMAL LOW (ref >60)
Glucose, Bld: 270 mg/dL — ABNORMAL HIGH (ref 70–99)
Phosphorus: 2.4 mg/dL — ABNORMAL LOW (ref 2.5–4.6)
Potassium: 3.9 mmol/L (ref 3.5–5.1)
Sodium: 130 mmol/L — ABNORMAL LOW (ref 135–145)

## 2022-09-05 LAB — GLUCOSE, CAPILLARY
Glucose-Capillary: 176 mg/dL — ABNORMAL HIGH (ref 70–99)
Glucose-Capillary: 179 mg/dL — ABNORMAL HIGH (ref 70–99)
Glucose-Capillary: 181 mg/dL — ABNORMAL HIGH (ref 70–99)
Glucose-Capillary: 201 mg/dL — ABNORMAL HIGH (ref 70–99)
Glucose-Capillary: 266 mg/dL — ABNORMAL HIGH (ref 70–99)
Glucose-Capillary: 268 mg/dL — ABNORMAL HIGH (ref 70–99)
Glucose-Capillary: 280 mg/dL — ABNORMAL HIGH (ref 70–99)

## 2022-09-05 LAB — CBC
HCT: 32.7 % — ABNORMAL LOW (ref 36.0–46.0)
HCT: 31.2 % — ABNORMAL LOW (ref 36.0–46.0)
Hemoglobin: 9.8 g/dL — ABNORMAL LOW (ref 12.0–15.0)
Hemoglobin: 9.1 g/dL — ABNORMAL LOW (ref 12.0–15.0)
MCH: 23.7 pg — ABNORMAL LOW (ref 26.0–34.0)
MCH: 23.4 pg — ABNORMAL LOW (ref 26.0–34.0)
MCHC: 30 g/dL (ref 30.0–36.0)
MCHC: 29.2 g/dL — ABNORMAL LOW (ref 30.0–36.0)
MCV: 79 fL — ABNORMAL LOW (ref 80.0–100.0)
MCV: 80.2 fL (ref 80.0–100.0)
Platelets: 184 10*3/uL (ref 150–400)
Platelets: 176 10*3/uL (ref 150–400)
RBC: 4.14 MIL/uL (ref 3.87–5.11)
RBC: 3.89 MIL/uL (ref 3.87–5.11)
RDW: 20.1 % — ABNORMAL HIGH (ref 11.5–15.5)
RDW: 20.2 % — ABNORMAL HIGH (ref 11.5–15.5)
WBC: 5.1 10*3/uL (ref 4.0–10.5)
WBC: 4.5 10*3/uL (ref 4.0–10.5)
nRBC: 0 % (ref 0.0–0.2)
nRBC: 0 % (ref 0.0–0.2)

## 2022-09-05 MED ORDER — NYSTATIN 100000 UNIT/ML MT SUSP: 5.0000 mL | Freq: Four times a day (QID) | OROMUCOSAL | 0 refills | Status: DC

## 2022-09-05 MED ORDER — ALTEPLASE 2 MG IJ SOLR: 2.0000 mg | Freq: Once | INTRAMUSCULAR | Status: DC | PRN

## 2022-09-05 MED ORDER — HEPARIN SODIUM (PORCINE) 1000 UNIT/ML DIALYSIS: 1000.0000 [IU] | INTRAMUSCULAR | Status: DC | PRN

## 2022-09-05 MED ORDER — ATORVASTATIN CALCIUM 80 MG PO TABS: 80.0000 mg | ORAL_TABLET | Freq: Every day | ORAL | Status: DC

## 2022-09-05 MED ORDER — LIDOCAINE-PRILOCAINE 2.5-2.5 % EX CREA: 1.0000 | TOPICAL_CREAM | CUTANEOUS | Status: DC | PRN

## 2022-09-05 MED ORDER — CHLORHEXIDINE GLUCONATE CLOTH 2 % EX PADS
6.0000 | MEDICATED_PAD | Freq: Every day | CUTANEOUS | Status: DC
  Administered 2022-09-05: 6 via TOPICAL

## 2022-09-05 MED ORDER — LANTUS SOLOSTAR 100 UNIT/ML ~~LOC~~ SOPN: 16.0000 [IU] | PEN_INJECTOR | Freq: Two times a day (BID) | SUBCUTANEOUS | 0 refills | Status: DC

## 2022-09-05 MED ORDER — NOVOLOG FLEXPEN 100 UNIT/ML ~~LOC~~ SOPN: 0.0000 [IU] | PEN_INJECTOR | SUBCUTANEOUS | 0 refills | Status: DC

## 2022-09-05 MED ORDER — AMIODARONE HCL 200 MG PO TABS: ORAL_TABLET | ORAL | Status: DC

## 2022-09-05 MED ORDER — LORAZEPAM 0.5 MG PO TABS: 0.5000 mg | ORAL_TABLET | Freq: Three times a day (TID) | ORAL | 0 refills | Status: DC | PRN

## 2022-09-05 MED ORDER — HEPARIN SODIUM (PORCINE) 1000 UNIT/ML DIALYSIS
2000.0000 [IU] | Freq: Once | INTRAMUSCULAR | Status: AC
  Administered 2022-09-05: 2000 [IU] via INTRAVENOUS_CENTRAL

## 2022-09-05 MED ORDER — PREGABALIN 75 MG PO CAPS: 75.0000 mg | ORAL_CAPSULE | Freq: Every day | ORAL | 0 refills | Status: DC

## 2022-09-05 MED ORDER — ANTICOAGULANT SODIUM CITRATE 4% (200MG/5ML) IV SOLN: 5.0000 mL | Status: DC | PRN

## 2022-09-05 MED ORDER — PENTAFLUOROPROP-TETRAFLUOROETH EX AERO: 1.0000 | INHALATION_SPRAY | CUTANEOUS | Status: DC | PRN

## 2022-09-05 MED ORDER — PANTOPRAZOLE SODIUM 40 MG PO TBEC: 40.0000 mg | DELAYED_RELEASE_TABLET | Freq: Two times a day (BID) | ORAL | 0 refills | Status: DC

## 2022-09-05 MED ORDER — APIXABAN 5 MG PO TABS: 5.0000 mg | ORAL_TABLET | Freq: Two times a day (BID) | ORAL | Status: DC

## 2022-09-05 MED ORDER — LIDOCAINE HCL (PF) 1 % IJ SOLN: 5.0000 mL | INTRAMUSCULAR | Status: DC | PRN

## 2022-09-05 NOTE — Progress Notes (Signed)
Pt to d/c back to snf today. Contacted [facility] SW to advise clinic of pt's d/c today and that pt should resume care on Monday.   Olivia Canter Renal Navigator [phone]

## 2022-09-05 NOTE — Plan of Care (Signed)
  Problem: Education: Goal: Ability to describe self-care measures that may prevent or decrease complications (Diabetes Survival Skills Education) will improve Outcome: Progressing Goal: Individualized Educational Video(s) Outcome: Progressing   Problem: Coping: Goal: Ability to adjust to condition or change in health will improve Outcome: Progressing   Problem: Fluid Volume: Goal: Ability to maintain a balanced intake and output will improve Outcome: Progressing   Problem: Health Behavior/Discharge Planning: Goal: Ability to identify and utilize available resources and services will improve Outcome: Progressing Goal: Ability to manage health-related needs will improve Outcome: Progressing   Problem: Metabolic: Goal: Ability to maintain appropriate glucose levels will improve Outcome: Progressing   Problem: Nutritional: Goal: Maintenance of adequate nutrition will improve Outcome: Progressing Goal: Progress toward achieving an optimal weight will improve Outcome: Progressing   Problem: Skin Integrity: Goal: Risk for impaired skin integrity will decrease Outcome: Progressing   Problem: Tissue Perfusion: Goal: Adequacy of tissue perfusion will improve Outcome: Progressing   Problem: Education: Goal: Knowledge of General Education information will improve Description: Including pain rating scale, medication(s)/side effects and non-pharmacologic comfort measures Outcome: Progressing   Problem: Health Behavior/Discharge Planning: Goal: Ability to manage health-related needs will improve Outcome: Progressing   Problem: Clinical Measurements: Goal: Ability to maintain clinical measurements within normal limits will improve Outcome: Progressing Goal: Will remain free from infection Outcome: Progressing Goal: Diagnostic test results will improve Outcome: Progressing Goal: Respiratory complications will improve Outcome: Progressing Goal: Cardiovascular complication will  be avoided Outcome: Progressing   Problem: Activity: Goal: Risk for activity intolerance will decrease Outcome: Progressing   Problem: Nutrition: Goal: Adequate nutrition will be maintained Outcome: Progressing   Problem: Coping: Goal: Level of anxiety will decrease Outcome: Progressing   Problem: Elimination: Goal: Will not experience complications related to bowel motility Outcome: Progressing Goal: Will not experience complications related to urinary retention Outcome: Progressing   Problem: Pain Managment: Goal: General experience of comfort will improve Outcome: Progressing   Problem: Safety: Goal: Ability to remain free from injury will improve Outcome: Progressing   Problem: Skin Integrity: Goal: Risk for impaired skin integrity will decrease Outcome: Progressing   

## 2022-09-05 NOTE — [place] FL2 (Signed)
[place] MEDICAID FL2 LEVEL OF CARE FORM     IDENTIFICATION  Patient Name: Susan [name] Birthdate: [birth year] Sex: female Admission Date (Current Location): 08/26/2022  [facility] and [place] Number:  Producer, television/film/video and Address:  [facility] [place], [address] [place], [place] [address]      Provider Number: [id number]  Attending Physician Name and Address:  Leroy Sea, MD  Relative Name and Phone Number:       Current Level of Care: Hospital Recommended Level of Care: Skilled Nursing Facility Prior Approval Number:    Date Approved/Denied:   PASRR Number:  [id number]   Discharge Plan: SNF    Current Diagnoses: Patient Active Problem List   Diagnosis Date Noted   Secondary hypercoagulable state 09/04/2022   Right sided abdominal pain 08/31/2022   Constipation 06/07/2022   History of Clostridioides difficile colitis 06/06/2022   Below-knee amputation of right lower extremity 06/06/2022   Diverticulitis 06/05/2022   Stercoral colitis 06/05/2022   C. difficile colitis 06/05/2022   Spleen hematoma 06/05/2022   Dehiscence of amputation stump of right lower extremity 06/05/2022   Rectal ulcer 05/27/2022   ESRD on dialysis 05/27/2022   GI bleed 05/23/2022   Difficult intravenous access 05/23/2022   Gangrene of right foot 05/02/2022   S/P BKA (below knee amputation) unilateral, right 05/02/2022   Unspecified protein-calorie malnutrition 04/15/2022   Secondary hyperparathyroidism of renal origin 04/14/2022   Coagulation defect, unspecified 04/09/2022   Acquired absence of other left toe(s) 04/07/2022   Allergy, unspecified, initial encounter 04/07/2022   Dependence on renal dialysis 04/07/2022   Gout due to renal impairment, unspecified site 04/07/2022   Hypertensive heart and chronic kidney disease with heart failure and with stage 5 chronic kidney disease, or end stage renal disease 04/07/2022   Personal history of transient ischemic  attack (TIA), and cerebral infarction without residual deficits 04/07/2022   Renal osteodystrophy 04/07/2022   Venous stasis ulcer of right calf 03/31/2022   Fistula, colovaginal 03/26/2022   Diarrhea 03/26/2022   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   Sepsis without acute organ dysfunction    Bacteremia    Acute pancreatitis 02/01/2022   Abdominal pain 02/01/2022   SIRS (systemic inflammatory response syndrome) 02/01/2022   Transaminitis 02/01/2022   History of anemia due to chronic kidney disease 02/01/2022   Paroxysmal atrial fibrillation 02/01/2022   DKA (diabetic ketoacidosis) 01/14/2022   NSTEMI (non-ST elevated myocardial infarction) 03/05/2021   Acute renal failure superimposed on stage 4 chronic kidney disease 08/22/2020   Hypoalbuminemia 05/25/2020   GERD (gastroesophageal reflux disease) 05/25/2020   Pressure injury of skin 05/17/2020   Acute on chronic combined systolic and diastolic congestive heart failure 03/07/2020   Type 2 diabetes mellitus with diabetic polyneuropathy, with long-term current use of insulin 03/07/2020   Obesity, Class III, BMI 40-49.9 (morbid obesity) 03/07/2020   Common bile duct (CBD) obstruction 05/28/2019   Benign neoplasm of ascending colon    Benign neoplasm of transverse colon    Benign neoplasm of descending colon    Benign neoplasm of sigmoid colon    Gastric polyps    Hyperkalemia 03/11/2019   Prolonged QT interval 03/11/2019   Acute blood loss anemia 03/11/2019   Onychomycosis 06/21/2018   Osteomyelitis of second toe of right foot    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    PVD (peripheral vascular disease) 10/26/2017   E-coli UTI 07/27/2017   Hypothyroidism 07/27/2017   AKI (acute kidney injury)  PAH (pulmonary artery hypertension)    Impaired ambulation 07/19/2017   Nausea & vomiting 07/15/2017   Leg cramps 02/27/2017   Peripheral edema 01/12/2017   Diabetic neuropathy 11/12/2016   CKD (chronic kidney  disease), stage IV 10/24/2015   Anemia of chronic disease 10/03/2015   Generalized anxiety disorder 10/03/2015   Insomnia 10/03/2015   Hyperglycemia due to diabetes mellitus 06/07/2015   Chronic diastolic CHF (congestive heart failure) 06/07/2015   Non compliance with medical treatment 04/17/2014   Rotator cuff tear 03/14/2014   Class 3 obesity 09/23/2013   Chronic HFrEF (heart failure with reduced ejection fraction) 06/03/2013   Hypotension 12/25/2012   Urinary incontinence    MDD (major depressive disorder) 11/12/2010   RBBB (right bundle branch block)    Wide-complex tachycardia    Coronary artery disease    Hyperlipemia 01/22/2009   Essential hypertension 01/22/2009    Orientation RESPIRATION BLADDER Height & Weight     Self, Time, Situation, Place  O2 (2L nasal cannula) Incontinent Weight: 185 lb 3 oz (84 kg) Height:  5\' 6"  (167.6 cm)  BEHAVIORAL SYMPTOMS/MOOD NEUROLOGICAL BOWEL NUTRITION STATUS      Incontinent Diet (See dc summary)  AMBULATORY STATUS COMMUNICATION OF NEEDS Skin   Extensive Assist Verbally PU Stage and Appropriate Care (Stage II on coccyx)                       Personal Care Assistance Level of Assistance  Bathing, Feeding, Dressing Bathing Assistance: Maximum assistance Feeding assistance: Independent Dressing Assistance: Limited assistance     Functional Limitations Info             SPECIAL CARE FACTORS FREQUENCY  PT (By licensed PT), OT (By licensed OT)                    Contractures Contractures Info: Not present    Additional Factors Info  Code Status, Allergies, Insulin Sliding Scale, Psychotropic Code Status Info: Full Allergies Info: Cephalexin, Codeine Psychotropic Info: Ativan Insulin Sliding Scale Info: see dc summary       Current Medications (09/05/2022):  This is the current hospital active medication list Current Facility-Administered Medications  Medication Dose Route Frequency Provider Last Rate Last  Admin   acetaminophen (TYLENOL) tablet 650 mg  650 mg Oral Q6H PRN Russella Dar, NP   650 mg at 09/03/22 2052   Or   acetaminophen (TYLENOL) suppository 650 mg  650 mg Rectal Q6H PRN Russella Dar, NP       amiodarone (PACERONE) tablet 200 mg  200 mg Oral BID Leroy Sea, MD   200 mg at 09/04/22 2319   [START ON 09/14/2022] amiodarone (PACERONE) tablet 200 mg  200 mg Oral Daily Leroy Sea, MD       apixaban (ELIQUIS) tablet 5 mg  5 mg Oral BID Smitty Cords, RPH   5 mg at 09/04/22 2319   atorvastatin (LIPITOR) tablet 80 mg  80 mg Oral Daily Marjie Skiff E, PA-C   80 mg at 09/04/22 1244   Chlorhexidine Gluconate Cloth 2 % PADS 6 each  6 each Topical Q0600 Delano Metz, MD   6 each at 09/05/22 0531   Darbepoetin Alfa (ARANESP) injection 100 mcg  100 mcg Subcutaneous Q Wed-1800 Julien Nordmann, PA-C   100 mcg at 09/03/22 1719   HYDROcodone-acetaminophen (NORCO/VICODIN) 5-325 MG per tablet 1 tablet  1 tablet Oral Q4H PRN Leroy Sea, MD   1  tablet at 09/04/22 1011   insulin aspart (novoLOG) injection 0-5 Units  0-5 Units Subcutaneous QHS Zigmund Daniel., MD   3 Units at 09/04/22 2319   insulin aspart (novoLOG) injection 0-6 Units  0-6 Units Subcutaneous TID WC Zigmund Daniel., MD   3 Units at 09/04/22 1730   insulin glargine-yfgn (SEMGLEE) injection 15 Units  15 Units Subcutaneous BID Leroy Sea, MD   15 Units at 09/04/22 2320   levothyroxine (SYNTHROID) tablet 75 mcg  75 mcg Oral QAC breakfast Zigmund Daniel., MD   75 mcg at 09/05/22 0531   LORazepam (ATIVAN) tablet 0.5 mg  0.5 mg Oral Q8H PRN Zigmund Daniel., MD   0.5 mg at 09/01/22 2059   metoprolol tartrate (LOPRESSOR) injection 5 mg  5 mg Intravenous Q5 min PRN Zigmund Daniel., MD   5 mg at 09/01/22 1244   midodrine (PROAMATINE) tablet 5 mg  5 mg Oral TID WC Zigmund Daniel., MD   5 mg at 09/04/22 1632   nystatin (MYCOSTATIN) 100000 UNIT/ML suspension 500,000  Units  5 mL Mouth/Throat QID Leroy Sea, MD   500,000 Units at 09/04/22 2318   pantoprazole (PROTONIX) EC tablet 40 mg  40 mg Oral BID Imogene Burn, MD   40 mg at 09/04/22 2319   pregabalin (LYRICA) capsule 75 mg  75 mg Oral Daily Zigmund Daniel., MD   75 mg at 09/04/22 1245   prochlorperazine (COMPAZINE) injection 10 mg  10 mg Intravenous Q6H PRN Zigmund Daniel., MD   10 mg at 09/03/22 1723   promethazine (PHENERGAN) 12.5 mg in sodium chloride 0.9 % 50 mL IVPB  12.5 mg Intravenous Q8H PRN Leroy Sea, MD       sevelamer carbonate (RENVELA) tablet 1,600 mg  1,600 mg Oral TID WC Zigmund Daniel., MD   1,600 mg at 09/04/22 1632   sodium chloride flush (NS) 0.9 % injection 3 mL  3 mL Intravenous Q12H Russella Dar, NP   3 mL at 09/04/22 2321   vortioxetine HBr (TRINTELLIX) tablet 20 mg  20 mg Oral Daily Zigmund Daniel., MD   20 mg at 09/04/22 1245     Discharge Medications: Please see discharge summary for a list of discharge medications.  Relevant Imaging Results:  Relevant Lab Results:   Additional Information SSN: [id number]; out-pt HD at [facility] on MWF  [employer], LCSW

## 2022-09-05 NOTE — TOC Transition Note (Signed)
Transition of Care [facility]) - CM/SW Discharge Note   Patient Details  Name: Susan [name] MRN: [id number] Date of Birth: Mar 23, 1951  [facility]) CM/SW Contact:  Mearl Latin, LCSW Phone Number: 09/05/2022, 4:58 PM   Clinical Narrative:    Patient will DC to: [facility] Anticipated DC date: 09/05/22 Family notified: Pt notified spouse Transport by: PTAR 6:30pm (after HD)   Per MD patient ready for DC to [facility]. RN to call report prior to discharge [phone] room [phone]). RN, patient, patient's family, and facility notified of DC. Discharge Summary and FL2 sent to facility. DC packet on chart including signed scripts. Ambulance transport requested for patient.   CSW will sign off for now as social work intervention is no longer needed. Please consult [name] again if new needs arise.     Final next level of care: Skilled Nursing Facility Barriers to Discharge: Barriers Resolved   Patient Goals and CMS Choice CMS Medicare.gov Compare Post Acute Care list provided to:: Patient Choice offered to / list presented to : Patient  Discharge Placement     Existing PASRR number confirmed : 09/05/22          Patient chooses bed at: [facility] Patient to be transferred to facility by: PTAR Name of family member notified: Pt notified spouse Patient and family notified of of transfer: 09/05/22  Discharge Plan and Services Additional resources added to the After Visit Summary for   In-house Referral: Clinical Social Work   Post Acute Care Choice: Skilled Nursing Facility                               Social Determinants of Health (SDOH) Interventions SDOH Screenings   Food Insecurity: No Food Insecurity (08/27/2022)  Housing: Low Risk  (08/27/2022)  Transportation Needs: No Transportation Needs (08/27/2022)  Utilities: Not At Risk (08/27/2022)  Alcohol Screen: Low Risk  (08/16/2021)  Depression (PHQ2-9): Low Risk  (04/23/2022)  Financial  Resource Strain: Low Risk  (08/16/2021)  Physical Activity: Inactive (08/16/2021)  Social Connections: Moderately Isolated (08/16/2021)  Stress: No Stress Concern Present (08/16/2021)  Tobacco Use: Medium Risk (09/03/2022)     Readmission Risk Interventions    09/04/2022    5:11 PM 05/12/2022    5:04 PM 02/07/2022    2:57 PM  Readmission Risk Prevention Plan  Transportation Screening Complete Complete Complete  Medication Review Oceanographer) Complete  Complete  PCP or Specialist appointment within 3-5 days of discharge Complete  Complete  HRI or Home Care Consult Complete  Complete  SW Recovery Care/Counseling Consult Complete Complete Complete  Palliative Care Screening Not Applicable  Not Applicable  Skilled Nursing Facility Complete Complete Not Applicable

## 2022-09-05 NOTE — Progress Notes (Signed)
[facility] Progress Note   Subjective:   Reports ongoing abdominal pain. Says she is slightly SOB, no change over the past month. Denies orthopnea, CP, palpitations, dizziness. Looks like plan for d/c today.   Objective Vitals:   09/04/22 2209 09/05/22 0002 09/05/22 0421 09/05/22 0804  BP: (!) 135/59 123/62 124/63 (!) 112/57  Pulse: (!) 102 99 98 (!) 106  Resp: 16 16 15 14   Temp: 98.6 F (37 C) 98.4 F (36.9 C) 98.3 F (36.8 C)   TempSrc: Oral Oral Oral   SpO2: 100%   100%  Weight:      Height:       Physical Exam General: Alert female in NAD Heart: RRR, no murmurs, rubs or gallops Lungs: CTA anteriorly, On RA Abdomen: Soft, non-distended, +BS Extremities: trace edema b/l lower extremities Dialysis Access: TDC in R chest  Additional Objective Labs: Basic Metabolic Panel: Recent Labs  Lab 09/01/22 0428 09/02/22 0723 09/03/22 0425 09/04/22 0757  NA 127* 129* 132* 126*  K 3.7 3.4* 3.8 4.0  CL 97* 94* 95* 91*  CO2 23 23 23 23   GLUCOSE 338* 282* 309* 425*  BUN 24* 19 23 33*  CREATININE 4.59* 3.44* 4.57* 5.33*  CALCIUM 7.8* 8.2* 8.7* 8.6*  PHOS 5.1* 3.3 3.5  --    Liver Function Tests: Recent Labs  Lab 09/01/22 0428 09/02/22 0723 09/03/22 0425  AST 12* 10* 9*  ALT 10 12 9   ALKPHOS 102 104 91  BILITOT 0.5 0.6 0.3  PROT 5.1* 5.7* 5.4*  ALBUMIN 2.2* 2.4* 2.3*   No results for input(s): "LIPASE", "AMYLASE" in the last 168 hours. CBC: Recent Labs  Lab 09/01/22 0428 09/02/22 0723 09/03/22 0425 09/04/22 0531 09/05/22 0600  WBC 3.8* 5.0 4.8 4.7 5.1  NEUTROABS 2.2 3.3 2.9  --   --   HGB 8.7* 9.5* 9.0* 9.3* 9.8*  HCT 30.6* 31.8* 32.0* 32.4* 32.7*  MCV 81.6 78.7* 81.2 80.4 79.0*  PLT 160 186 200 184 184   Blood Culture    Component Value Date/Time   SDES URINE, RANDOM 08/29/2022 0528   SPECREQUEST  08/29/2022 0528    NONE Reflexed from [id number] Performed at [name], [address], [place], [place] [address]    CULT  MULTIPLE SPECIES PRESENT, SUGGEST RECOLLECTION (A) 08/29/2022 0528   REPTSTATUS 08/30/2022 FINAL 08/29/2022 0528    Cardiac Enzymes: No results for input(s): "CKTOTAL", "CKMB", "CKMBINDEX", "TROPONINI" in the last 168 hours. CBG: Recent Labs  Lab 09/04/22 1715 09/04/22 2206 09/04/22 2359 09/05/22 0423 09/05/22 0810  GLUCAP 262* 300* 266* 176* 181*   Iron Studies: No results for input(s): "IRON", "TIBC", "TRANSFERRIN", "FERRITIN" in the last 72 hours. @lablastinr3 @ Studies/Results: No results found. Medications:  promethazine (PHENERGAN) injection (IM or IVPB)      amiodarone  200 mg Oral BID   [START ON 09/14/2022] amiodarone  200 mg Oral Daily   apixaban  5 mg Oral BID   atorvastatin  80 mg Oral Daily   Chlorhexidine Gluconate Cloth  6 each Topical Q0600   darbepoetin (ARANESP) injection - DIALYSIS  100 mcg Subcutaneous Q Wed-1800   insulin aspart  0-5 Units Subcutaneous QHS   insulin aspart  0-6 Units Subcutaneous TID WC   insulin glargine-yfgn  15 Units Subcutaneous BID   levothyroxine  75 mcg Oral QAC breakfast   midodrine  5 mg Oral TID WC   nystatin  5 mL Mouth/Throat QID   pantoprazole  40 mg Oral BID  pregabalin  75 mg Oral Daily   sevelamer carbonate  1,600 mg Oral TID WC   sodium chloride flush  3 mL Intravenous Q12H   vortioxetine HBr  20 mg Oral Daily    Dialysis Orders: MWF SW 4hr, 400/800, EDW 93kg, 2K/2Ca bath, RIJ TDC, Heparin 4000 + - venofer 50mg  IV weekly - mircera 225 mcg IV q 2 wks, last 3/26, due 4/09  Assessment/Plan: Abdominal pain + vomiting + diarrhea: CT 4/3 with proctitis. HIDA scan negative, C.diff negative. GI consulted -> plan for IV Ceftriaxone + Flagyl only at that time. Repeat CT 4/6 with infarcts to spleen, kidney -> now on IV heparin. EGD showed mild esophageal candidiasis, plan for nystatin at d/c and GI follow up 2. Non-sustained VT episode 4/8: Cardiology following, transitioned to IV amiodarone and eliquis 3. ESRD:  Had HD off schedule yesterday. Continue HD on MWF schedule- HD today. Clotted circuit yesterday, now back on eliquis, will restart low dose heparin bolus with HD 4. HTN/ volume: BP low/stable on midodrine, anasarca on imaging -> UF as tolerated. She is now below prior EDW but still volume overloaded, will lower on dischagre.   5. Anemia of ESRD: Hgb 9.8- continue aranesp 6. Secondary HPTH: CorrCa/Phos ok - continue Renvela as binder, no VRDA for now.   7. pA-fib: Prev on amiodarone + Eliquis, both held -> now back on amio + eliquis 8. T2DM: On insulin, BS high recently.  Rogers Blocker, PA-C 09/05/2022, 9:00 AM  [facility] Pager: [phone]

## 2022-09-05 NOTE — Discharge Instructions (Signed)
Follow with Primary MD Donita Brooks, MD in 7 days   Get CBC, CMP, 2 view Chest X ray -  checked next visit with your primary MD or SNF MD   Activity: As tolerated with Full fall precautions use walker/cane & assistance as needed  Disposition SNF  Diet: Renal-low carbohydrate diet, 1.2 L fluid restriction per day.  Check CBGs q. [facility] S.  Special Instructions: If you have smoked or chewed Tobacco  in the last 2 yrs please stop smoking, stop any regular Alcohol  and or any Recreational drug use.  On your next visit with your primary care physician please Get Medicines reviewed and adjusted.  Please request your Prim.MD to go over all Hospital Tests and Procedure/Radiological results at the follow up, please get all Hospital records sent to your Prim MD by signing hospital release before you go home.  If you experience worsening of your admission symptoms, develop shortness of breath, life threatening emergency, suicidal or homicidal thoughts you must seek medical attention immediately by calling 911 or calling your MD immediately  if symptoms less severe.  You Must read complete instructions/literature along with all the possible adverse reactions/side effects for all the Medicines you take and that have been prescribed to you. Take any new Medicines after you have completely understood and accpet all the possible adverse reactions/side effects.

## 2022-09-05 NOTE — Discharge Summary (Signed)
Susan [name] QMV:[id number] DOB: 04-29-1951 DOA: 08/26/2022  PCP: Donita Brooks, MD  Admit date: 08/26/2022  Discharge date: 09/05/2022  Admitted From: SNF   Disposition:  SNF   Recommendations for Outpatient Follow-up:   Follow up with PCP in 1-2 weeks  PCP Please obtain BMP/CBC, 2 view CXR in 1week,  (see Discharge instructions)   PCP Please follow up on the following pending results:    Home Health: None   Equipment/Devices: None  Consultations: GI, Renal Discharge Condition: Stable    CODE STATUS: Full    Diet Recommendation:-Low carbohydrate diet, 1.2 L fluid restriction per day.  Check CBGs q. ACH S.    Chief Complaint  Patient presents with   Abdominal Pain     Brief history of present illness from the day of admission and additional interim summary    72 yo F with ESRD, diastolic HF, peripheral neuropathy, HTN, hypothyroidism, and T2DM presenting with nausea, vomiting, and abdominal pain. These symptoms developed after eating mac and cheese and ham leftover from Easter. Imaging notable for proctitis with mesenteric congestive changes in the lower abdomen and pelvis. Also with notable RUQ TTP, RUQ US shows distended gallbladder with small stones and trace pericholecystic fluid. Her workup was consistent with proctocolitis, splenic and right renal infarct of unclear etiology, wide-complex tachycardia for which cardiology was consulted presumed to be A-fib with aberrancy versus nonsustained V. tach. She is so far been seen by cardiology, GI and renal. Transferred to my care on 09/03/2022 on day 7 of her hospital stay.                                                                  Hospital Course   Abdominal pain nausea vomiting imaging consistent of of proctocolitis - GI on board, nonspecific gallbladder  changes on CT scan for which HIDA was done and unremarkable.  C. difficile stool study likely suggest colonization, she was seen by GI underwent EGD and flex sig on 09/03/2022 showing mild esophageal candidiasis for which she has been started on a statin and feeling better, case discussed with GI physician Dr. Elnoria Howard in detail, she has completed antibiotic course for colitis.  Nystatin swish and swallow x 10 more days with stop date on 09/15/2022, GI symptoms have improved, stable for discharge with outpatient follow-up with PCP and GI in 1 to 2 weeks postdischarge.      Paroxysmal atrial fibrillation.  Wide-complex tachycardia while in the hospital -  [place] vas 2 score of greater than 4.  Seen by cardiology, echo shows a EF less than 35%, and by cardiology now transition from IV amiodarone to oral amiodarone on 09/04/2022 along with IV heparin to Eliquis. QTc has now improved to 442 ms.  Goal is rate control, outpatient follow-up with cardiology postdischarge  in 1 to 2 weeks.   ESRD -  Has right IJ dialysis access.  On HD schedule per nephrology.  Volume removal through dialysis.   Right renal infarct, splenic infarct -  In the setting of underlying A-fib.  Anticoagulation as above.   Hypothyroidism -  On Synthroid.   Hypotension - on midodrine.   DM type II -  Long-acting insulin + ISS, check CBGs q. ACH S at SNF and adjust as needed      Discharge diagnosis     Principal Problem:   Abdominal pain Active Problems:   Wide-complex tachycardia   Acute on chronic combined systolic and diastolic congestive heart failure   Right sided abdominal pain   Secondary hypercoagulable state    Discharge instructions    Discharge Instructions     Discharge instructions   Complete by: As directed    Follow with Primary MD Donita Brooks, MD in 7 days   Get CBC, CMP, 2 view Chest X ray -  checked next visit with your primary MD or SNF MD   Activity: As tolerated with Full fall precautions use  walker/cane & assistance as needed  Disposition SNF  Diet: Renal-low carbohydrate diet, 1.2 L fluid restriction per day.  Check CBGs q. ACH S.  Special Instructions: If you have smoked or chewed Tobacco  in the last 2 yrs please stop smoking, stop any regular Alcohol  and or any Recreational drug use.  On your next visit with your primary care physician please Get Medicines reviewed and adjusted.  Please request your Prim.MD to go over all Hospital Tests and Procedure/Radiological results at the follow up, please get all Hospital records sent to your Prim MD by signing hospital release before you go home.  If you experience worsening of your admission symptoms, develop shortness of breath, life threatening emergency, suicidal or homicidal thoughts you must seek medical attention immediately by calling 911 or calling your MD immediately  if symptoms less severe.  You Must read complete instructions/literature along with all the possible adverse reactions/side effects for all the Medicines you take and that have been prescribed to you. Take any new Medicines after you have completely understood and accpet all the possible adverse reactions/side effects.   Discharge wound care:   Complete by: As directed    Right;Left;Posterior Stage 1 -  Intact skin with non-blanchable redness of a localized area usually over a bony prominence. 105 days    Pressure Injury Buttocks Right Deep Tissue Pressure Injury - Purple or maroon localized area of discolored intact skin or blood-filled blister due to damage of underlying soft tissue from pressure and/or shear. 104 days   Pressure Injury  Coccyx Mid Stage 2 -  Partial thickness loss of dermis presenting as a shallow open injury with a red, pink wound bed without slough. 72mmx5mmx0 red center unattached edges   Increase activity slowly   Complete by: As directed        Discharge Medications   Allergies as of 09/05/2022       Reactions   Cephalexin  Diarrhea, Other (See Comments)   Codeine Nausea And Vomiting, Other (See Comments)        Medication List     STOP taking these medications    aspirin EC 81 MG tablet   feeding supplement (PRO-STAT SUGAR FREE 64) Liqd   fluconazole 150 MG tablet Commonly known as: DIFLUCAN   hydrALAZINE 25 MG tablet Commonly known as: APRESOLINE  TAKE these medications    albuterol 108 (90 Base) MCG/ACT inhaler Commonly known as: VENTOLIN HFA Inhale 1-2 puffs into the lungs every 6 (six) hours as needed for wheezing or shortness of breath.   amiodarone 200 MG tablet Commonly known as: PACERONE Take 200 mg 1 pill twice a day till 09/13/2022, from 09/14/2022 take 200 mg 1 pill once a day. What changed:  how much to take how to take this when to take this additional instructions   Anusol-HC 2.5 % rectal cream Generic drug: hydrocortisone Place 1 Application rectally 2 (two) times daily. (1000 & 2100)   apixaban 5 MG Tabs tablet Commonly known as: ELIQUIS Take 1 tablet (5 mg total) by mouth 2 (two) times daily.   ascorbic acid 500 MG tablet Commonly known as: VITAMIN C Take 500 mg by mouth daily.   atorvastatin 80 MG tablet Commonly known as: LIPITOR Take 1 tablet (80 mg total) by mouth daily.   Daily Vite Tabs Take 1 tablet by mouth daily.   diphenoxylate-atropine 2.5-0.025 MG tablet Commonly known as: Lomotil Take 1 tablet by mouth 4 (four) times daily as needed for diarrhea or loose stools.   feeding supplement (NEPRO CARB STEADY) Liqd Take 237 mLs by mouth daily. (0900)   ferrous sulfate 325 (65 FE) MG tablet Take 325 mg by mouth 2 (two) times daily. (1000 & 2100)   folic acid 1 MG tablet Commonly known as: FOLVITE Take 1 mg by mouth daily.   FreeStyle Libre 2 Sensor Misc   Lantus SoloStar 100 UNIT/ML Solostar Pen Generic drug: insulin glargine Inject 16 Units into the skin 2 (two) times daily. What changed:  how much to take when to take this    levothyroxine 75 MCG tablet Commonly known as: SYNTHROID Take 75 mcg by mouth daily before breakfast.   linagliptin 5 MG Tabs tablet Commonly known as: TRADJENTA Take 1 tablet (5 mg total) by mouth daily.   LORazepam 0.5 MG tablet Commonly known as: ATIVAN Take 1 tablet (0.5 mg total) by mouth every 8 (eight) hours as needed for anxiety.   midodrine 10 MG tablet Commonly known as: PROAMATINE Take 10 mg by mouth every Monday, Wednesday, and Friday with hemodialysis.   midodrine 5 MG tablet Commonly known as: PROAMATINE Take 5 mg by mouth See admin instructions. Take 1 tablet (5 mg) by mouth with meals on Tuesdays, Thursdays, Saturdays & Sundays.   NovoLOG FlexPen 100 UNIT/ML FlexPen Generic drug: insulin aspart Inject 0-15 Units into the skin See admin instructions. Inject 12 units subcutaneously 3 times daily with meals (0900, 1300 & 1700) & as needed via sliding scale insulin CBG <70=Notify NP/MD 71-149=0 units,150-199=4 units, 200-249=6 units,250- 299=10 units, 300-349=12 units, 350-399=15 units, > 400 call MD ( prime pen with 2 units prior  to set dose)   nystatin 100000 UNIT/ML suspension Commonly known as: MYCOSTATIN Use as directed 5 mLs (500,000 Units total) in the mouth or throat 4 (four) times daily.   ondansetron 4 MG tablet Commonly known as: ZOFRAN Take 4 mg by mouth every 8 (eight) hours as needed for nausea.   OneTouch Delica Plus Lancet30G Misc Use as directed to check blood sugars as need up to 4 times daily   OneTouch Verio Flex System w/Device Kit Use up to four times daily as directed.   oxyCODONE-acetaminophen 5-325 MG tablet Commonly known as: PERCOCET/ROXICET Take 1 tablet by mouth every 6 (six) hours as needed.   OXYGEN Inhale 2 L/min into the lungs at  bedtime.   pantoprazole 40 MG tablet Commonly known as: Protonix Take 1 tablet (40 mg total) by mouth 2 (two) times daily before a meal. What changed: when to take this   Pentips 32G X 4 MM  Misc Generic drug: Insulin Pen Needle Use as directed   polyethylene glycol 17 g packet Commonly known as: MIRALAX / GLYCOLAX Take 17 g by mouth 2 (two) times daily. What changed:  when to take this reasons to take this   pregabalin 75 MG capsule Commonly known as: LYRICA Take 1 capsule (75 mg total) by mouth daily.   senna-docusate 8.6-50 MG tablet Commonly known as: Senokot-S Take 2 tablets by mouth 2 (two) times daily.   sevelamer carbonate 800 MG tablet Commonly known as: RENVELA Take 1,600 mg by mouth 3 (three) times daily with meals. (0800, 1200 & 1700)   vortioxetine HBr 20 MG Tabs tablet Commonly known as: Trintellix Take 1 tablet (20 mg total) by mouth in the morning. (1000)               Discharge Care Instructions  (From admission, onward)           Start     Ordered   09/05/22 0000  Discharge wound care:       Comments: Right;Left;Posterior Stage 1 -  Intact skin with non-blanchable redness of a localized area usually over a bony prominence. 105 days    Pressure Injury Buttocks Right Deep Tissue Pressure Injury - Purple or maroon localized area of discolored intact skin or blood-filled blister due to damage of underlying soft tissue from pressure and/or shear. 104 days   Pressure Injury  Coccyx Mid Stage 2 -  Partial thickness loss of dermis presenting as a shallow open injury with a red, pink wound bed without slough. 58mmx5mmx0 red center unattached edges   09/05/22 0838             Follow-up Information     [facility]. Go on 09/12/2022.   Specialty: Cardiology Why: 1:30 PM, [facility], Entrance C,parking code [id number] Contact information: [address] [id number] [place] [address] [phone]        Donita Brooks, MD. Schedule an appointment as soon as possible for a visit in 1 week(s).   Specialty: Family Medicine Contact information: [address]  [address] [place] [address] [phone]         Jeani Hawking, MD. Schedule an appointment as soon as possible for a visit in 1 week(s).   Specialty: Gastroenterology Contact information: [address] [place] [address] [phone]                 Major procedures and Radiology Reports - PLEASE review detailed and final reports thoroughly  -       DG CHEST PORT 1 VIEW  Result Date: 09/01/2022 CLINICAL DATA:  Shortness of breath EXAM: PORTABLE CHEST 1 VIEW COMPARISON:  06/14/2022 FINDINGS: Cardiac shadow is stable. Aortic calcifications are seen. Right jugular dialysis catheter is again seen and stable. The lungs are well aerated bilaterally. No focal infiltrate or effusion is seen. No bony abnormality is noted. IMPRESSION: No active disease. Electronically Signed   By: Alcide Clever M.D.   On: 09/01/2022 19:10   CT ABDOMEN PELVIS W CONTRAST  Result Date: 08/30/2022 CLINICAL DATA:  Abdominal pain. EXAM: CT ABDOMEN AND PELVIS WITH CONTRAST TECHNIQUE: Multidetector CT imaging of the abdomen and  pelvis was performed using the standard protocol following bolus administration of intravenous contrast. RADIATION DOSE REDUCTION: This exam was performed according to the departmental dose-optimization program which includes automated exposure control, adjustment of the mA and/or kV according to patient size and/or use of iterative reconstruction technique. CONTRAST:  75mL OMNIPAQUE IOHEXOL 350 MG/ML SOLN COMPARISON:  Recent CTA chest, abdomen and pelvis 08/27/2022, CT chest and upper abdomen without contrast 06/12/2022. FINDINGS: Lower chest: Stable cardiomegaly, today with an asymmetric right chamber predominance. Three-vessel calcific CAD. Infusion catheter again terminates at the superior cavoatrial junction and a small pericardial effusion again collecting to the right anteriorly. Still seen are a moderate sized left and small right layering pleural effusions with  compressive atelectasis or consolidation in the basal segments of the left lower lobe. Thin walled posterior basal right lower lobe air cyst is again noted. There previously was interstitial edema in the lungs which is not seen today. The pulmonary veins are no longer dilated. There are calcified right hilar nodes. Hepatobiliary: The gallbladder is increasingly distended up to 12 cm in length. Small stones layer in the proximal lumen. No wall thickening or bile duct dilatation. There are calcified granulomas in the liver but no mass enhancement. Pancreas: No mass, ductal dilatation or inflammatory change. Spleen: Again measuring mildly prominent 14.7 cm AP, capsular contraction again noted over a small hypodensity in the superior spleen which most likely either represents a subacute to late stage infarct or resolving infectious process, and was considerably larger on 06/12/2022. Scattered calcified granulomas with no other focal abnormality. Adrenals/Urinary Tract: Chronic nodular thickening both adrenal glands is unchanged. There are renovascular calcifications. Mild global volume loss of both kidneys is again shown. No stone or urinary obstruction is seen. There is a new wedge-shaped hypoenhancement in the upper to midpole right kidney laterally (series 4 axial 40 and 41 and coronal reconstruction image 78 of series 7). This is most likely a renal infarct, alternatively could be related to pyelonephritis but there are no adjacent inflammatory changes. The remainder of the kidneys enhance homogeneously. There is faint contrast in the bladder. Mild bladder thickening and perivesical haziness compatible with cystitis continues to be seen. There is no bladder mass. Stomach/Bowel: Contracted stomach. No small bowel obstruction or inflammation is seen. The appendix contains contrast and is normal caliber. There or continued mesenteric congestive changes in the mid to lower abdomen and pelvis. There are colonic  diverticula greatest in the sigmoid without a focal diverticulitis. Severe circumferential rectal thickening continues to be seen with adjacent stranding and has been seen on previous studies as well warranting direct visualization to exclude underlying lesion. Vascular/Lymphatic: Aortoiliac and extensive visceral branch vessel atherosclerosis. No AAA. No adenopathy. Reproductive: Status post hysterectomy. No adnexal masses. Other: Small amount of posterior pelvic ascites is again noted, trace fluid in the pericolic gutters and scattered within the mesenteric folds. Body wall anasarca noted moderately and appears similar. No free air, free hemorrhage or abscess. Musculoskeletal: Osteopenia and degenerative skeletal changes. No primary destructive or pathologic process. IMPRESSION: 1. New wedge-shaped hypoenhancement in the upper to midpole right kidney laterally, most likely a renal infarct, less likely pyelonephritis. 2. Increasingly distended gallbladder with cholelithiasis but no wall thickening or bile duct dilatation. 3. Likely cystitis, seen previously. 4. Cardiomegaly with asymmetric right chamber predominance. Stable small pericardial effusion. 5. Moderate left and small right layering pleural effusions with compressive atelectasis or consolidation left lower lobe. Interstitial edema is no longer seen in the visualized lungs. 6. Stable small  pericardial effusion. 7. Stable mild splenomegaly with capsular contraction over a small hypodensity in the superior spleen which most likely either represents a subacute to late stage infarct or resolving infectious process. 8. Continued severe circumferential rectal thickening with adjacent stranding. This could be due to proctitis or neoplasm. Direct visualization recommended unless already done. 9. Anasarca, mesenteric congestive changes, small amount of ascites similar to 3 days ago. 10. Diverticulosis without diverticulitis. 11. Aortic and coronary artery  atherosclerosis. Aortic Atherosclerosis (ICD10-I70.0). Electronically Signed   By: Almira Bar M.D.   On: 08/30/2022 20:52   ECHOCARDIOGRAM COMPLETE  Result Date: 08/30/2022    ECHOCARDIOGRAM REPORT   Patient Name:   JIZZELLE LAPINSKY Date of Exam: 08/30/2022 Medical Rec #:  [id number]    Height:       66.0 in Accession #:    [id number]   Weight:       196.4 lb Date of Birth:  04-16-1951    BSA:          1.985 m Patient Age:    72 years     BP:           114/61 mmHg Patient Gender: F            HR:           103 bpm. Exam Location:  Inpatient Procedure: 2D Echo, Cardiac Doppler, Color Doppler and Intracardiac            Opacification Agent Indications:    Other abnormalities of the heart R00.8  History:        Patient has prior history of Echocardiogram examinations, most                 recent 02/02/2022. CHF, CAD and Previous Myocardial Infarction,                 Stroke, Arrythmias:Tachycardia and RBBB, Signs/Symptoms:Syncope                 and Shortness of Breath; Risk Factors:Former Smoker,                 Dyslipidemia and Hypertension.  Sonographer:    Aron Baba Referring Phys: [phone] A CALDWELL POWELL JR  Sonographer Comments: Suboptimal apical window. Image acquisition challenging due to patient body habitus and Image acquisition challenging due to respiratory motion. IMPRESSIONS  1. Global hypokinesis with apical akinesis; overall moderate to severe LV dysfunction.  2. Left ventricular ejection fraction, by estimation, is 30 to 35%. The left ventricle has moderate to severely decreased function. The left ventricle demonstrates regional wall motion abnormalities (see scoring diagram/findings for description). There is mild left ventricular hypertrophy. Left ventricular diastolic parameters are indeterminate. Elevated left atrial pressure.  3. Right ventricular systolic function is mildly reduced. The right ventricular size is moderately enlarged. There is normal pulmonary artery systolic pressure.  4. Left  atrial size was moderately dilated.  5. Right atrial size was mildly dilated.  6. Moderate pleural effusion in the left lateral region.  7. The mitral valve is normal in structure. No evidence of mitral valve regurgitation. No evidence of mitral stenosis.  8. Tricuspid valve regurgitation is moderate.  9. The aortic valve is normal in structure. Aortic valve regurgitation is not visualized. Aortic valve sclerosis/calcification is present, without any evidence of aortic stenosis. 10. The inferior vena cava is normal in size with greater than 50% respiratory variability, suggesting right atrial pressure of 3 mmHg. FINDINGS  Left Ventricle: Left ventricular ejection fraction,  by estimation, is 30 to 35%. The left ventricle has moderate to severely decreased function. The left ventricle demonstrates regional wall motion abnormalities. Definity contrast agent was given IV to delineate the left ventricular endocardial borders. The left ventricular internal cavity size was normal in size. There is mild left ventricular hypertrophy. Left ventricular diastolic parameters are indeterminate. Elevated left atrial pressure. Right Ventricle: The right ventricular size is moderately enlarged. Right ventricular systolic function is mildly reduced. There is normal pulmonary artery systolic pressure. The tricuspid regurgitant velocity is 2.58 m/s, and with an assumed right atrial pressure of 8 mmHg, the estimated right ventricular systolic pressure is 34.6 mmHg. Left Atrium: Left atrial size was moderately dilated. Right Atrium: Right atrial size was mildly dilated. Pericardium: There is no evidence of pericardial effusion. Mitral Valve: The mitral valve is normal in structure. Mild mitral annular calcification. No evidence of mitral valve regurgitation. No evidence of mitral valve stenosis. Tricuspid Valve: The tricuspid valve is normal in structure. Tricuspid valve regurgitation is moderate . No evidence of tricuspid stenosis.  Aortic Valve: The aortic valve is normal in structure. Aortic valve regurgitation is not visualized. Aortic valve sclerosis/calcification is present, without any evidence of aortic stenosis. Pulmonic Valve: The pulmonic valve was normal in structure. Pulmonic valve regurgitation is not visualized. No evidence of pulmonic stenosis. Aorta: The aortic root is normal in size and structure. Venous: The inferior vena cava is normal in size with greater than 50% respiratory variability, suggesting right atrial pressure of 3 mmHg. IAS/Shunts: No atrial level shunt detected by color flow Doppler. Additional Comments: Global hypokinesis with apical akinesis; overall moderate to severe LV dysfunction. There is a moderate pleural effusion in the left lateral region.  LEFT VENTRICLE PLAX 2D LVIDd:         5.10 cm   Diastology LVIDs:         4.10 cm   LV e' medial:    2.68 cm/s LV PW:         1.30 cm   LV E/e' medial:  51.9 LV IVS:        1.00 cm   LV e' lateral:   10.80 cm/s LVOT diam:     1.70 cm   LV E/e' lateral: 12.9 LV SV:         39 LV SV Index:   19 LVOT Area:     2.27 cm  RIGHT VENTRICLE RV S prime:     2.92 cm/s TAPSE (M-mode): 1.0 cm LEFT ATRIUM             Index        RIGHT ATRIUM           Index LA diam:        3.60 cm 1.81 cm/m   RA Area:     22.70 cm LA Vol (A2C):   97.5 ml 49.13 ml/m  RA Volume:   66.70 ml  33.61 ml/m LA Vol (A4C):   69.3 ml 34.92 ml/m LA Biplane Vol: 83.3 ml 41.97 ml/m  AORTIC VALVE             PULMONIC VALVE LVOT Vmax:   105.00 cm/s PR End Diast Vel: 3.44 msec LVOT Vmean:  68.700 cm/s LVOT VTI:    0.170 m  AORTA Ao Root diam: 3.40 cm Ao Asc diam:  3.20 cm MITRAL VALVE                TRICUSPID VALVE MV Area (PHT): 5.62 cm  TR Peak grad:   26.6 mmHg MV Decel Time: 135 msec     TR Vmax:        258.00 cm/s MV E velocity: 139.00 cm/s MV A velocity: 36.90 cm/s   SHUNTS MV E/A ratio:  3.77         Systemic VTI:  0.17 m                             Systemic Diam: 1.70 cm Olga Millers MD  Electronically signed by Olga Millers MD Signature Date/Time: 08/30/2022/2:36:28 PM    Final    NM Hepatobiliary Liver Func  Result Date: 08/28/2022 CLINICAL DATA:  Right upper quadrant abdominal pain. EXAM: NUCLEAR MEDICINE HEPATOBILIARY IMAGING TECHNIQUE: Sequential images of the abdomen were obtained out to 60 minutes following intravenous administration of radiopharmaceutical. RADIOPHARMACEUTICALS:  5.2 mCi Tc-82m  Choletec IV COMPARISON:  CT scan and ultrasound examinations 08/27/2022 FINDINGS: Symmetric uptake in the liver and fairly prompt excretion into the biliary tree. Activity is seen in the small bowel by 30 minutes. The gallbladder is visualized at 25 minutes. IMPRESSION: No findings for cystic or common bile duct obstruction. Electronically Signed   By: Rudie Meyer M.D.   On: 08/28/2022 16:12   US Abdomen Limited RUQ (LIVER/GB)  Result Date: 08/27/2022 CLINICAL DATA:  151471 RUQ pain 151471 EXAM: ULTRASOUND ABDOMEN LIMITED COMPARISON:  02/01/2022. FINDINGS: The liver demonstrates normal parenchymal echogenicity and homogeneous texture without focal hepatic parenchymal lesions or intrahepatic ductal dilatation. Hepatopetal portal vein. Gallbladder is distended with a couple small shadowing stones. No wall thickening. There is trace pericholecystic fluid identified. Consider HIDA scan to further evaluate for cholecystitis if that is a concern clinically. CBD measured 0.7cm. Right kidney cyst measuring 1.3 cm was incidentally noted. IMPRESSION: 1. Distended gallbladder with small stones and trace pericholecystic fluid. Consider HIDA scan if there is a need to further evaluate for cholecystitis. 2. CBD dilatation. MRCP may be helpful to evaluate for choledocholithiasis. Electronically Signed   By: Layla Maw M.D.   On: 08/27/2022 09:59   CT ANGIO CHEST/ABD/PEL FOR DISSECTION W &/OR WO CONTRAST  Result Date: 08/27/2022 CLINICAL DATA:  Suspected acute aortic syndrome. EXAM: CT ANGIOGRAPHY  CHEST, ABDOMEN AND PELVIS TECHNIQUE: Non-contrast CT of the chest was initially obtained. Multidetector CT imaging through the chest, abdomen and pelvis was performed using the standard protocol during bolus administration of intravenous contrast. Multiplanar reconstructed images and MIPs were obtained and reviewed to evaluate the vascular anatomy. RADIATION DOSE REDUCTION: This exam was performed according to the departmental dose-optimization program which includes automated exposure control, adjustment of the mA and/or kV according to patient size and/or use of iterative reconstruction technique. CONTRAST:  OMNIPAQUE IOHEXOL 350 MG/ML SOLN COMPARISON:  The chest CT without contrast 06/12/2022, abdomen and pelvis CT no contrast 06/05/2022, and CTA abdomen and pelvis 05/23/2022. FINDINGS: CTA CHEST FINDINGS Cardiovascular: There is mild-to-moderate panchamber cardiomegaly, interval increased. Three-vessel coronary calcifications and likely stents are present. Right IJ double-lumen catheter terminates at the superior cavoatrial junction. Minimal chronic pericardial effusion collecting to the right inferiorly. Pulmonary arteries are normal caliber without visible emboli. The pulmonary veins are distended. There is IVC and hepatic vein contrast reflux which may suggest right heart dysfunction or tricuspid regurgitation. There is moderate to heavy calcification in the aortic arch, mild scattered calcific plaque in the descending segment. There is 80% calcific origin stenosis of the left subclavian artery and at least an 80%  calcific origin stenosis of the left vertebral artery. There are innominate and proximal right subclavian artery calcifications which are nonstenosing. Mediastinum/Nodes: Prominent subcarinal lymph nodes are again noted up to 1.4 cm in short axis. Enlarged precarinal lymph node again measures 1.5 cm short axis and is unchanged. A right mid hilar lymph node again measures 1.1 cm in short axis.  There are calcified right hilar and subcarinal nodes. There is a 6 mm calcified nodule in the right lobe of the thyroid gland. No follow-up imaging is recommended. There is no axillary or supraclavicular adenopathy. The thoracic trachea is unremarkable. The thoracic esophagus is normal in thickness. Lungs/Pleura: There is generalized interstitial edema. Small to moderate left and small right layering pleural effusions are present. There is compressive atelectasis or consolidation in the left lower lobe basal segments alongside the effusion. There is ground-glass haziness of the upper to mid lung fields, most likely ground-glass edema, less likely pneumonitis. There is abundant breathing motion artifact. A calcified granuloma noted in the medial right upper lobe anterior segment. No noncalcified nodule is seen through the breathing motion. There is no pneumothorax. There are scattered linear scar-like opacities in the bases. Musculoskeletal: There are degenerative changes of the thoracic spine and multilevel bridging enthesopathy. No primary pathologic process is seen. A bone island is again noted in the right humeral head. The ribcage is intact. Review of the MIP images confirms the above findings. CTA ABDOMEN AND PELVIS FINDINGS VASCULAR Aorta: Moderate calcific plaques. No aneurysm, dissection or stenosis. Celiac: Moderate calcific plaques are noted without flow limiting stenosis or branch occlusion. Circumferential calcification in the branch arteries is again noted including extensive small branch arterial calcifications in the spleen and liver. SMA: There are moderate calcifications without flow-limiting stenosis. Calcifications continue into the branch arteries without evidence of branch occlusion. Renals: Ostial calcifications associated with 40-50% arterial origin stenosis on the left and 40% arterial origin stenosis on the right. Calcifications continuing to the hila and parenchymal branch arteries. IMA:  There is circumferential calcification and severe origin stenosis. Flow is otherwise preserved. Inflow: Moderate to heavy calcification both common iliac and internal iliac arteries. Vessel stenosis does not exceed 50% in either vessel, with calcifications continuing into the internal iliac branch arteries. There is only mild calcification in the external iliac arteries, without stenosis. Veins: Unopacified except for hepatic veins, which are patent Review of the MIP images confirms the above findings. NON-VASCULAR Hepatobiliary: Respiratory motion limits evaluation. Scattered calcified granulomas are again noted without appreciable mass. There are gallstones with no obvious wall thickening no biliary dilatation is seen through the breathing motion. Pancreas: No abnormality is seen through the breathing motion. Spleen: Interval new capsular contraction is seen over a hypodense abnormality in the superomedial aspect of the spleen, which was previously larger. Findings most likely due to an evolving infarct or a "burned out" infectious process. There are scattered calcified granulomas. No other focal abnormality. Adrenals/Urinary Tract: Extensive renovascular calcifications. Chronic nodular thickening both adrenal glands. Mild bilateral renal volume loss is again shown but there is homogeneous cortical enhancement, with scattered scar-like defects. No hydronephrosis or stone disease is seen. No mass enhancement. There is mild bladder thickening and perivesical haziness concerning for cystitis. Stomach/Bowel: The stomach is contracted. The small bowel is normal caliber. Mesenteric congestive changes noted in lower abdomen and pelvis. Appendix is normal. Most of the large bowel is normal thickness. There are diffuse diverticula. There is moderate diffuse thickening of the wall of the most distal sigmoid and continuing  through the rectum with perirectal stranding. Findings consistent with proctocolitis which was seen on  the prior studies as well. Consider follow-up colonoscopy. No bowel pneumatosis is seen. Lymphatic: There is no lymphadenopathy. Reproductive: Status post hysterectomy. No adnexal masses. Other: Small volume of pelvic ascites. Minimal ascites in the pericolic gutters. No free air. No abscess or pneumatosis. Musculoskeletal: Osteopenia and degenerative change of the spine. No aggressive focal pathologic process. Mild generalized body wall anasarca has increased since the last CT. Review of the MIP images confirms the above findings. IMPRESSION: 1. Aortic and branch vessel atherosclerosis without aneurysm or dissection. 2. 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 3. Cardiomegaly with pulmonary venous distention, generalized interstitial edema and left greater than right pleural effusions. Findings consistent with CHF or fluid overload. 4. Ground-glass haziness in the upper to mid lung fields, most likely ground-glass edema, less likely pneumonitis. 5. Compressive atelectasis or consolidation in the left lower lobe basal segments alongside the effusion. 6. Stable mediastinal and right hilar adenopathy. 7. Interval new capsular contraction over a hypodense abnormality in the superomedial aspect of the spleen, previously larger. Findings most likely due to an evolving infarct or a burned out infectious process. 8. Likely cystitis. 9. Cholelithiasis. 10. 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 No bowel pneumatosis. Consider follow-up colonoscopy if not recently performed. This has been seen previously. The etiology could be infectious, inflammatory or ischemic but there is no pneumatosis. 11. Diverticulosis without evidence of diverticulitis. 12. Body wall anasarca increased since the last CT. Aortic Atherosclerosis (ICD10-I70.0). Electronically Signed   By: Almira Bar M.D.   On: 08/27/2022 06:08   HYBRID  OR IMAGING ([facility] ONLY)  Result Date: 08/20/2022 There is no interpretation for this exam.  This order is for images obtained during a surgical procedure.  Please See "Surgeries" Tab for more information regarding the procedure.     Today   Subjective    Anyra Kaufman today has no headache,no chest abdominal pain,no new weakness tingling or numbness, feels much better wants to go home today.    Objective   Blood pressure (!) 112/57, pulse 92, temperature 98.3 F (36.8 C), temperature source Oral, resp. rate 14, height 5\' 6"  (1.676 m), weight 84 kg, SpO2 100 %.   Intake/Output Summary (Last 24 hours) at 09/05/2022 0839 Last data filed at 09/04/2022 2100 Gross per 24 hour  Intake 600 ml  Output 2000 ml  Net -1400 ml    Exam  Awake Alert, No new F.N deficits,    [place].AT,PERRAL Supple Neck,   Symmetrical Chest wall movement, Good air movement bilaterally, CTAB RRR,No Gallops,   +ve B.Sounds, Abd Soft, Non tender,  No Cyanosis, Clubbing or edema    Data Review   Recent Labs  Lab 08/30/22 0158 08/31/22 0314 09/01/22 0428 09/02/22 0723 09/03/22 0425 09/04/22 0531 09/05/22 0600  WBC 5.0 3.9* 3.8* 5.0 4.8 4.7 5.1  HGB 9.4* 9.2* 8.7* 9.5* 9.0* 9.3* 9.8*  HCT 32.1* 31.3* 30.6* 31.8* 32.0* 32.4* 32.7*  PLT 168 170 160 186 200 184 184  MCV 79.5* 79.6* 81.6 78.7* 81.2 80.4 79.0*  MCH 23.3* 23.4* 23.2* 23.5* 22.8* 23.1* 23.7*  MCHC 29.3* 29.4* 28.4* 29.9* 28.1* 28.7* 30.0  RDW 20.3* 20.0* 19.9* 19.9* 20.0* 20.0* 20.1*  LYMPHSABS 0.7 0.8 0.8 0.9 0.9  --   --   MONOABS 0.6 0.6 0.6 0.6 0.8  --   --   EOSABS  0.2 0.2 0.2 0.2 0.2  --   --   BASOSABS 0.1 0.0 0.1 0.0 0.0  --   --     Recent Labs  Lab 08/30/22 0158 08/31/22 0314 09/01/22 0428 09/02/22 0723 09/03/22 0425 09/04/22 0757  NA 132* 128* 127* 129* 132* 126*  K 2.9* 3.2* 3.7 3.4* 3.8 4.0  CL 97* 97* 97* 94* 95* 91*  CO2 ANIONGAP GLUCOSE 118* 55* 338* 282* 309* 425*  BUN 13 20 24*  19 23 33*  CREATININE 2.56* 3.27* 4.59* 3.44* 4.57* 5.33*  AST 15 15 12* 10* 9*  --   ALT --   ALKPHOS 112 111 102 104 91  --   BILITOT 0.7 0.6 0.5 0.6 0.3  --   ALBUMIN 2.4* 2.4* 2.2* 2.4* 2.3*  --   CRP  --   --  0.6  --   --   --   MG 1.7 1.7 1.9 1.6* 1.8 1.9  CALCIUM 8.1* 7.9* 7.8* 8.2* 8.7* 8.6*    Total Time in preparing paper work, data evaluation and todays exam - 35 minutes  Signature  -    Susa Raring M.D on 09/05/2022 at 8:39 AM   -  To page go to [email or web address]

## 2022-09-05 NOTE — Plan of Care (Signed)
  Problem: Coping: Goal: Ability to adjust to condition or change in health will improve Outcome: Progressing   Problem: Fluid Volume: Goal: Ability to maintain a balanced intake and output will improve Outcome: Progressing   Problem: Health Behavior/Discharge Planning: Goal: Ability to identify and utilize available resources and services will improve Outcome: Progressing Goal: Ability to manage health-related needs will improve Outcome: Progressing   

## 2022-09-05 NOTE — TOC Progression Note (Addendum)
Transition of Care [facility]) - Progression Note    Patient Details  Name: Susan [name] MRN: [id number] Date of Birth: 1950-12-12  [facility] CM/SW Contact  Mearl Latin, LCSW Phone Number: 09/05/2022, 8:58 AM  Clinical Narrative:    8:58am-Insurance approval still pending for [facility].   10:33 AM Insurance approval received for [employer], Ref# [id number]. Theodoro Grist # [id number].  CSW updated patient and made her aware that she only has 13 SNF days left. She reported understanding and stated she will have to discharge home after that. She stated prior to snf she used transport to pick her up for dialysis.    Expected Discharge Plan: Skilled Nursing Facility Barriers to Discharge: Insurance Authorization  Expected Discharge Plan and Services In-house Referral: Clinical Social Work   Post Acute Care Choice: Skilled Nursing Facility Living arrangements for the past 2 months: Skilled Nursing Facility Expected Discharge Date: 09/05/22                                     Social Determinants of Health (SDOH) Interventions SDOH Screenings   Food Insecurity: No Food Insecurity (08/27/2022)  Housing: Low Risk  (08/27/2022)  Transportation Needs: No Transportation Needs (08/27/2022)  Utilities: Not At Risk (08/27/2022)  Alcohol Screen: Low Risk  (08/16/2021)  Depression (PHQ2-9): Low Risk  (04/23/2022)  Financial Resource Strain: Low Risk  (08/16/2021)  Physical Activity: Inactive (08/16/2021)  Social Connections: Moderately Isolated (08/16/2021)  Stress: No Stress Concern Present (08/16/2021)  Tobacco Use: Medium Risk (09/03/2022)    Readmission Risk Interventions    09/04/2022    5:11 PM 05/12/2022    5:04 PM 02/07/2022    2:57 PM  Readmission Risk Prevention Plan  Transportation Screening Complete Complete Complete  Medication Review Oceanographer) Complete  Complete  PCP or Specialist appointment within 3-5 days of discharge Complete  Complete  HRI or Home Care  Consult Complete  Complete  SW Recovery Care/Counseling Consult Complete Complete Complete  Palliative Care Screening Not Applicable  Not Applicable  Skilled Nursing Facility Complete Complete Not Applicable

## 2022-09-05 NOTE — Progress Notes (Signed)
Received patient in bed to unit.  Alert and oriented.  Informed consent signed and in chart.   TX duration: 3.5hrs  Patient tolerated well.  Alert, without acute distress.  Hand-off given to patient's nurse.   Access used: R IJ Access issues: None  Total UF removed: Medication(s) given: None  Post HD weight: 93.4kg   09/05/22 1704  Vitals  Temp 98.2 F (36.8 C)  Temp Source Oral  BP 114/73  MAP (mmHg) 86  BP Location Right Arm  BP Method Automatic  Patient Position (if appropriate) Lying  Pulse Rate (!) 116  Pulse Rate Source Monitor  ECG Heart Rate (!) 110  Resp 19  Oxygen Therapy  SpO2 100 %  O2 Device Nasal Cannula  O2 Flow Rate (L/min) 2 L/min  During Treatment Monitoring  Intra-Hemodialysis Comments Tx completed;Tolerated well  Post Treatment  Dialyzer Clearance Lightly streaked  Duration of HD Treatment -hour(s) 3.5 hour(s)  Liters Processed 84  Fluid Removed (mL) 2000 mL  Tolerated HD Treatment Yes  Hemodialysis Catheter Right Subclavian  Placement Date: 06/06/22   Placed prior to admission: Yes  Orientation: Right  Access Location: Subclavian  Site Condition No complications  Blue Lumen Status Dead end cap in place;Heparin locked  Red Lumen Status Dead end cap in place;Heparin locked  Purple Lumen Status N/A  Catheter fill solution Heparin 1000 units/ml  Catheter fill volume (Arterial) 1.9 cc  Catheter fill volume (Venous) 1.9  Dressing Type Transparent  Dressing Status Antimicrobial disc in place;Clean, Dry, Intact  Interventions Other (Comment)  Drainage Description None  Dressing Change Due 09/11/22  Post treatment catheter status Capped and Clamped     Librado Guandique G [facility]

## 2022-09-05 NOTE — Progress Notes (Signed)
OT Cancellation Note  Patient Details Name: Susan [name] MRN: [id number] DOB: Feb 03, 1951   Cancelled Treatment:    Reason Eval/Treat Not Completed: Other (comment). Pt. Politely declined attempted skilled OT treatment session stating she feels nauseous and has vomited when she attempted drinking ginger ale and would like to rest.    Alessandra Bevels Lorraine-COTA/L 09/05/2022, 9:56 AM

## 2022-09-07 ENCOUNTER — Encounter (HOSPITAL_COMMUNITY): Payer: Self-pay | Admitting: Gastroenterology

## 2022-09-08 ENCOUNTER — Emergency Department (HOSPITAL_COMMUNITY): Payer: PPO

## 2022-09-08 ENCOUNTER — Encounter (HOSPITAL_COMMUNITY): Payer: Self-pay

## 2022-09-08 ENCOUNTER — Emergency Department (HOSPITAL_COMMUNITY)
Admission: EM | Admit: 2022-09-08 | Discharge: 2022-09-08 | Disposition: A | Discharge status: Rehabilitation Facility | Payer: PPO | Attending: Emergency Medicine | Admitting: Emergency Medicine

## 2022-09-08 DIAGNOSIS — Z7984 Long term (current) use of oral hypoglycemic drugs: Secondary | ICD-10-CM | POA: Diagnosis not present

## 2022-09-08 DIAGNOSIS — R112 Nausea with vomiting, unspecified: Secondary | ICD-10-CM | POA: Insufficient documentation

## 2022-09-08 DIAGNOSIS — Z79890 Hormone replacement therapy (postmenopausal): Secondary | ICD-10-CM | POA: Insufficient documentation

## 2022-09-08 DIAGNOSIS — Z794 Long term (current) use of insulin: Secondary | ICD-10-CM | POA: Diagnosis not present

## 2022-09-08 DIAGNOSIS — I5032 Chronic diastolic (congestive) heart failure: Secondary | ICD-10-CM | POA: Insufficient documentation

## 2022-09-08 DIAGNOSIS — R197 Diarrhea, unspecified: Secondary | ICD-10-CM | POA: Diagnosis not present

## 2022-09-08 DIAGNOSIS — I2510 Atherosclerotic heart disease of native coronary artery without angina pectoris: Secondary | ICD-10-CM | POA: Insufficient documentation

## 2022-09-08 DIAGNOSIS — N186 End stage renal disease: Secondary | ICD-10-CM | POA: Insufficient documentation

## 2022-09-08 DIAGNOSIS — R109 Unspecified abdominal pain: Secondary | ICD-10-CM | POA: Diagnosis not present

## 2022-09-08 DIAGNOSIS — I132 Hypertensive heart and chronic kidney disease with heart failure and with stage 5 chronic kidney disease, or end stage renal disease: Secondary | ICD-10-CM | POA: Insufficient documentation

## 2022-09-08 DIAGNOSIS — Z7901 Long term (current) use of anticoagulants: Secondary | ICD-10-CM | POA: Insufficient documentation

## 2022-09-08 DIAGNOSIS — M6281 Muscle weakness (generalized): Secondary | ICD-10-CM | POA: Diagnosis not present

## 2022-09-08 DIAGNOSIS — I10 Essential (primary) hypertension: Secondary | ICD-10-CM | POA: Diagnosis not present

## 2022-09-08 DIAGNOSIS — Z992 Dependence on renal dialysis: Secondary | ICD-10-CM | POA: Diagnosis not present

## 2022-09-08 DIAGNOSIS — E1122 Type 2 diabetes mellitus with diabetic chronic kidney disease: Secondary | ICD-10-CM | POA: Insufficient documentation

## 2022-09-08 DIAGNOSIS — E1165 Type 2 diabetes mellitus with hyperglycemia: Secondary | ICD-10-CM | POA: Diagnosis not present

## 2022-09-08 DIAGNOSIS — E039 Hypothyroidism, unspecified: Secondary | ICD-10-CM | POA: Insufficient documentation

## 2022-09-08 DIAGNOSIS — Z79899 Other long term (current) drug therapy: Secondary | ICD-10-CM | POA: Insufficient documentation

## 2022-09-08 DIAGNOSIS — K8020 Calculus of gallbladder without cholecystitis without obstruction: Secondary | ICD-10-CM | POA: Diagnosis not present

## 2022-09-08 DIAGNOSIS — R1084 Generalized abdominal pain: Secondary | ICD-10-CM | POA: Diagnosis not present

## 2022-09-08 LAB — COMPREHENSIVE METABOLIC PANEL
ALT: 12 U/L (ref 0–44)
AST: 14 U/L — ABNORMAL LOW (ref 15–41)
Albumin: 2.9 g/dL — ABNORMAL LOW (ref 3.5–5.0)
Alkaline Phosphatase: 117 U/L (ref 38–126)
Anion gap: 13 (ref 5–15)
BUN: 38 mg/dL — ABNORMAL HIGH (ref 8–23)
CO2: 23 mmol/L (ref 22–32)
Calcium: 9.6 mg/dL (ref 8.9–10.3)
Chloride: 94 mmol/L — ABNORMAL LOW (ref 98–111)
Creatinine, Ser: 4.48 mg/dL — ABNORMAL HIGH (ref 0.44–1.00)
GFR, Estimated: 10 mL/min — ABNORMAL LOW (ref >60)
Glucose, Bld: 383 mg/dL — ABNORMAL HIGH (ref 70–99)
Potassium: 4.5 mmol/L (ref 3.5–5.1)
Sodium: 130 mmol/L — ABNORMAL LOW (ref 135–145)
Total Bilirubin: 0.5 mg/dL (ref 0.3–1.2)
Total Protein: 6.8 g/dL (ref 6.5–8.1)

## 2022-09-08 LAB — LACTIC ACID, PLASMA
Lactic Acid, Venous: 1.6 mmol/L (ref 0.5–1.9)
Lactic Acid, Venous: 1.4 mmol/L (ref 0.5–1.9)

## 2022-09-08 LAB — CBC WITH DIFFERENTIAL/PLATELET
Abs Immature Granulocytes: 0.03 10*3/uL (ref 0.00–0.07)
Basophils Absolute: 0 10*3/uL (ref 0.0–0.1)
Basophils Relative: 1 %
Eosinophils Absolute: 0 10*3/uL (ref 0.0–0.5)
Eosinophils Relative: 0 %
HCT: 38.4 % (ref 36.0–46.0)
Hemoglobin: 11.1 g/dL — ABNORMAL LOW (ref 12.0–15.0)
Immature Granulocytes: 1 %
Lymphocytes Relative: 6 %
Lymphs Abs: 0.3 10*3/uL — ABNORMAL LOW (ref 0.7–4.0)
MCH: 23.2 pg — ABNORMAL LOW (ref 26.0–34.0)
MCHC: 28.9 g/dL — ABNORMAL LOW (ref 30.0–36.0)
MCV: 80.2 fL (ref 80.0–100.0)
Monocytes Absolute: 0.2 10*3/uL (ref 0.1–1.0)
Monocytes Relative: 5 %
Neutro Abs: 4.6 10*3/uL (ref 1.7–7.7)
Neutrophils Relative %: 87 %
Platelets: 228 10*3/uL (ref 150–400)
RBC: 4.79 MIL/uL (ref 3.87–5.11)
RDW: 20.5 % — ABNORMAL HIGH (ref 11.5–15.5)
WBC: 5.2 10*3/uL (ref 4.0–10.5)
nRBC: 0 % (ref 0.0–0.2)

## 2022-09-08 LAB — LIPASE, BLOOD: Lipase: 21 U/L (ref 11–51)

## 2022-09-08 LAB — CBG MONITORING, ED: Glucose-Capillary: 385 mg/dL — ABNORMAL HIGH (ref 70–99)

## 2022-09-08 LAB — TROPONIN I (HIGH SENSITIVITY): Troponin I (High Sensitivity): 20 ng/L — ABNORMAL HIGH (ref <18)

## 2022-09-08 MED ORDER — METOCLOPRAMIDE HCL 10 MG PO TABS: 10.0000 mg | ORAL_TABLET | Freq: Four times a day (QID) | ORAL | 0 refills | Status: DC | PRN

## 2022-09-08 MED ORDER — ONDANSETRON HCL 4 MG/2ML IJ SOLN
4.0000 mg | Freq: Once | INTRAMUSCULAR | Status: AC
  Administered 2022-09-08: 4 mg via INTRAVENOUS
  Filled 2022-09-08: qty 2

## 2022-09-08 MED ORDER — HYDROMORPHONE HCL 1 MG/ML IJ SOLN
0.5000 mg | Freq: Once | INTRAMUSCULAR | Status: AC
  Administered 2022-09-08: 0.5 mg via INTRAVENOUS
  Filled 2022-09-08: qty 1

## 2022-09-08 MED ORDER — IOHEXOL 350 MG/ML SOLN
75.0000 mL | Freq: Once | INTRAVENOUS | Status: AC | PRN
  Administered 2022-09-08: 75 mL via INTRAVENOUS

## 2022-09-08 MED ORDER — METOCLOPRAMIDE HCL 5 MG/ML IJ SOLN
10.0000 mg | Freq: Once | INTRAMUSCULAR | Status: AC
  Administered 2022-09-08: 10 mg via INTRAVENOUS
  Filled 2022-09-08: qty 2

## 2022-09-08 NOTE — ED Notes (Signed)
[facility] called; 6th in line at 16:24.

## 2022-09-08 NOTE — ED Notes (Signed)
Pt transported to CT ?

## 2022-09-08 NOTE — ED Notes (Signed)
Pt transported back to facility by [facility]. No acute distress at d/c, vitals stable.

## 2022-09-08 NOTE — ED Triage Notes (Signed)
BIBA from [facility] for abd pain, N/V, diarrhea.  O2 dependent and dialysis MWF.

## 2022-09-08 NOTE — ED Notes (Signed)
CBG 385 

## 2022-09-08 NOTE — Inpatient Diabetes Management (Signed)
Inpatient Diabetes Program Recommendations  AACE/ADA: New Consensus Statement on Inpatient Glycemic Control   Target Ranges:  Prepandial:   less than 140 mg/dL      Peak postprandial:   less than 180 mg/dL (1-2 hours)      Critically ill patients:  140 - 180 mg/dL    Latest Reference Range & Units 09/08/22 10:34  Glucose-Capillary 70 - 99 mg/dL 182 (H)   Review of Glycemic Control  Diabetes history: DM Outpatient Diabetes medications: Lantus 16 units BID, Tradjenta 5 mg daily Current orders for Inpatient glycemic control: None; in ED  Inpatient Diabetes Program Recommendations:    Insulin: If patient is admitted, please consider ordering Semglee 15 units daily, CBGs AC&HS, Novolog 0-6 units TID with meals, Novolog 0-5 units QHS, and Novolog 2 units TID with meals for meal coverage if patient eats at least 50% of meals.  NOTE: Patient is currently in ED form [facility] for abdominal pain, N/V/D. Patient was recently inpatient 08/27/22-09/05/22.   Thanks, Orlando Penner, RN, MSN, CDCES Diabetes Coordinator Inpatient Diabetes Program [phone] (Team Pager from 8am to 5pm)

## 2022-09-08 NOTE — ED Provider Notes (Signed)
[place] EMERGENCY DEPARTMENT AT [facility] Provider Note   CSN: [id number] Arrival date & time: 09/08/22  3005     History {Add pertinent medical, surgical, social history, OB history to HPI:1} Chief Complaint  Patient presents with   Abdominal Pain    Susan [name] is a 72 y.o. female.   Abdominal Pain   72 year old female presents emergency department with complaints of abdominal pain, nausea, vomiting.  Patient states symptoms began yesterday evening.  Patient reports history of similar symptoms before last hospital admission.  States she took at home Zofran which did not help her symptoms.  Describes abdominal pain as right lower with diffuse radiation.  Denies fever, chills, chest pain, shortness of breath, urinary/vaginal symptoms, hematemesis, hematochezia/melena.  Patient on 2 L of oxygen nasal cannula chronically with no increase recently.  Patient hemodialysis patient receiving dialysis Monday Wednesday Friday with last session this past Friday and patient reportedly lasted duration of session.  Past medical history significant for chronic diastolic CHF with left ventricular ejection fraction of less than 35%, ESRD on hemodialysis, NSTEMI, C. 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 Authorizing Provider  albuterol (VENTOLIN HFA) 108 (90 Base) MCG/ACT inhaler Inhale 1-2 puffs into the lungs every 6 (six) hours as needed for wheezing or shortness of breath.    [provider]  amiodarone (PACERONE) 200 MG tablet Take 200 mg 1 pill twice a day till 09/13/2022, from 09/14/2022 take 200 mg 1 pill once a day. 09/05/22   Leroy Sea, MD  apixaban (ELIQUIS) 5 MG TABS tablet Take 1 tablet (5 mg total) by mouth 2 (two) times daily.  09/05/22   Leroy Sea, MD  ascorbic acid (VITAMIN C) 500 MG tablet Take 500 mg by mouth daily.    [provider]  atorvastatin (LIPITOR) 80 MG tablet Take 1 tablet (80 mg total) by mouth daily. 09/05/22   Leroy Sea, MD  blood glucose meter kit and supplies Use up to four times daily as directed. 01/23/22   Ghimire, Werner Lean, MD  Continuous Blood Gluc Sensor (FREESTYLE LIBRE 2 SENSOR) MISC  01/14/22   [provider]  diphenoxylate-atropine (LOMOTIL) 2.5-0.025 MG tablet Take 1 tablet by mouth 4 (four) times daily as needed for diarrhea or loose stools. 08/19/21   Donita Brooks, MD  ferrous sulfate 325 (65 FE) MG tablet Take 325 mg by mouth 2 (two) times daily. (1000 & 2100)    [provider]  Fingerstix Lancets MISC Use as directed to check blood sugars as need up to 4 times daily 01/23/22   Ghimire, Werner Lean, MD  folic acid (FOLVITE) 1 MG tablet Take 1 mg by mouth daily.    [provider]  hydrocortisone (ANUSOL-HC) 2.5 % rectal cream Place 1 Application rectally 2 (two) times daily. (1000 & 2100)    [provider]  insulin aspart (NOVOLOG FLEXPEN) 100 UNIT/ML FlexPen Inject 0-15 Units into the skin See admin instructions. Inject 12 units subcutaneously 3 times daily with meals (0900, 1300 & 1700) & as needed via sliding scale insulin CBG <70=Notify NP/MD 71-149=0 units,150-199=4 units, 200-249=6 units,250- 299=10 units, 300-349=12 units, 350-399=15 units, > 400 call MD ( prime pen with 2 units prior  to set dose) 09/05/22   Leroy Sea, MD  insulin glargine (LANTUS  SOLOSTAR) 100 UNIT/ML Solostar Pen Inject 16 Units into the skin 2 (two) times daily. 09/05/22   Leroy Sea, MD  Insulin Pen Needle 32G X 4 MM MISC Use as directed 01/23/22   Maretta Bees, MD  levothyroxine (SYNTHROID) 75 MCG tablet Take 75 mcg by mouth daily before breakfast.    [provider]  linagliptin (TRADJENTA) 5 MG TABS tablet Take 1 tablet (5  mg total) by mouth daily. 04/07/22   Rhetta Mura, MD  LORazepam (ATIVAN) 0.5 MG tablet Take 1 tablet (0.5 mg total) by mouth every 8 (eight) hours as needed for anxiety. 09/05/22   Leroy Sea, MD  midodrine (PROAMATINE) 10 MG tablet Take 10 mg by mouth every Monday, Wednesday, and Friday with hemodialysis.    [provider]  midodrine (PROAMATINE) 5 MG tablet Take 5 mg by mouth See admin instructions. Take 1 tablet (5 mg) by mouth with meals on Tuesdays, Thursdays, Saturdays & Sundays.    [provider]  Multiple Vitamin (DAILY VITE) TABS Take 1 tablet by mouth daily.    [provider]  Nutritional Supplements (FEEDING SUPPLEMENT, NEPRO CARB STEADY,) LIQD Take 237 mLs by mouth daily. (0900)    [provider]  nystatin (MYCOSTATIN) 100000 UNIT/ML suspension Use as directed 5 mLs (500,000 Units total) in the mouth or throat 4 (four) times daily. 09/05/22   Leroy Sea, MD  ondansetron (ZOFRAN) 4 MG tablet Take 4 mg by mouth every 8 (eight) hours as needed for nausea.    [provider]  oxyCODONE-acetaminophen (PERCOCET/ROXICET) 5-325 MG tablet Take 1 tablet by mouth every 6 (six) hours as needed. 08/20/22   Baglia, Corrina, PA-C  OXYGEN Inhale 2 L/min into the lungs at bedtime.    [provider]  pantoprazole (PROTONIX) 40 MG tablet Take 1 tablet (40 mg total) by mouth 2 (two) times daily before a meal. 09/05/22 09/05/23  Leroy Sea, MD  polyethylene glycol (MIRALAX / GLYCOLAX) 17 g packet Take 17 g by mouth 2 (two) times daily. Patient taking differently: Take 17 g by mouth daily as needed (constipation.). 06/17/22   [place], Alvira Philips, DO  pregabalin (LYRICA) 75 MG capsule Take 1 capsule (75 mg total) by mouth daily. 09/05/22   Leroy Sea, MD  senna-docusate (SENOKOT-S) 8.6-50 MG tablet Take 2 tablets by mouth 2 (two) times daily. 06/17/22   [place], Alvira Philips, DO  sevelamer carbonate (RENVELA) 800 MG tablet Take 1,600  mg by mouth 3 (three) times daily with meals. (0800, 1200 & 1700)    [provider]  vortioxetine HBr (TRINTELLIX) 20 MG TABS tablet Take 1 tablet (20 mg total) by mouth in the morning. (1000) 08/11/22   Donita Brooks, MD      Allergies    Cephalexin and Codeine    Review of Systems   Review of Systems  Gastrointestinal:  Positive for abdominal pain.  All other systems reviewed and are negative.   Physical Exam Updated Vital Signs BP (!) 152/54   Pulse 64   Temp 98.1 F (36.7 C) (Oral)   Resp 18   Ht 5\' 6"  (1.676 m)   Wt 93 kg   SpO2 100%   BMI 33.09 kg/m  Physical Exam Vitals and nursing note reviewed.  Constitutional:      General: She is not in acute distress.    Appearance: She is well-developed.  HENT:     Head: Normocephalic and atraumatic.  Eyes:  Conjunctiva/sclera: Conjunctivae normal.  Cardiovascular:     Rate and Rhythm: Normal rate and regular rhythm.  Pulmonary:     Effort: Pulmonary effort is normal. No respiratory distress.     Breath sounds: Normal breath sounds.  Abdominal:     Palpations: Abdomen is soft.     Tenderness: There is generalized abdominal tenderness. There is no right CVA tenderness or left CVA tenderness.  Musculoskeletal:        General: No swelling.     Cervical back: Neck supple.     Right lower leg: No edema.     Left lower leg: No edema.     Comments: Right BKA appreciated  Skin:    General: Skin is warm and dry.     Capillary Refill: Capillary refill takes less than 2 seconds.  Neurological:     Mental Status: She is alert.  Psychiatric:        Mood and Affect: Mood normal.     ED Results / Procedures / Treatments   Labs (all labs ordered are listed, but only abnormal results are displayed) Labs Reviewed  COMPREHENSIVE METABOLIC PANEL  CBC WITH DIFFERENTIAL/PLATELET  URINALYSIS, W/ REFLEX TO CULTURE (INFECTION SUSPECTED)  LIPASE, BLOOD  LACTIC ACID, PLASMA  LACTIC ACID, PLASMA     EKG None  Radiology No results found.  Procedures Procedures  {Document cardiac monitor, telemetry assessment procedure when appropriate:1}  Medications Ordered in ED Medications  metoCLOPramide (REGLAN) injection 10 mg (has no administration in time range)  HYDROmorphone (DILAUDID) injection 0.5 mg (has no administration in time range)    ED Course/ Medical Decision Making/ A&P Clinical Course as of 09/08/22 1634  Mon Sep 08, 2022  1113 Sodium(!): 130 Patient with corrected sodium of 137 with blood glucose of 383. [CR]  1424 Consulted [place] [facility] regarding the patient who recommended holding on antibiotics, bland diet and follow-up with GI outpatient for reassessment. [CR]  1443 Reevaluation of the patient showed improvement of symptoms.  Will trial p.o. challenge with expectant discharge if able to tolerate by mouth. [CR]  1631 Reevaluation of the patient showed improvement of symptoms with passage of p.o. trial after second antiemetic administered.  Plan for discharge. [CR]    Clinical Course User Index [CR] Peter Garter, PA   {   Click here for ABCD2, HEART and other calculatorsREFRESH Note before signing :1}                          Medical Decision Making Amount and/or Complexity of Data Reviewed Labs: ordered. Decision-making details documented in ED Course. Radiology: ordered.  Risk Prescription drug management.   This patient presents to the ED for concern of abdominal pain, this involves an extensive number of treatment options, and is a complaint that carries with it a high risk of complications and morbidity.  The differential diagnosis includes gastritis, PUD, CBD pathology, cholecystitis, SBO/LBO, diverticulitis, appendicitis, ovarian torsion, ovarian mass, nephrolithiasis, pyelonephritis, cystitis   Co morbidities that complicate the patient evaluation  See HPI   Additional history obtained:  Additional history obtained from  EMR External records from outside source obtained and reviewed including hospital records   Lab Tests:  I Ordered, and personally interpreted labs.  The pertinent results include: No leukocytosis.  Mild evidence of anemia with a hemoglobin 11.1 of which is improved from prior laboratory studies performed.  Platelets within normal range.  Hyponatremia of which when corrected with patient's hyperglycemia within normal  range.  Patient with baseline renal dysfunction with BUN of 38, creatinine of 4.48 and GFR of 10.  No transaminitis.   Imaging Studies ordered:  I ordered imaging studies including CT abdomen pelvis I independently visualized and interpreted imaging which showed possible mild wall thickening and surrounding inflammation at splenic flexure of colon.  No evidence of bowel perforation or obstruction.  Prominent stool throughout colon.  Anasarca with generalized edema throughout subcutaneous fat.  Cholelithiasis without evidence of cholecystitis or biliary ductal dilation.  Decreased volume of left pleural effusion with persistent dependent atelectasis of left lung base.  Aortic atherosclerosis. I agree with the radiologist interpretation  Cardiac Monitoring: / EKG:  The patient was maintained on a cardiac monitor.  I personally viewed and interpreted the cardiac monitored which showed an underlying rhythm of: ***   Consultations Obtained:  See ED course  Problem List / ED Course / Critical interventions / Medication management  Generalized abdominal pain I ordered medication including hydromorphone, Reglan   Reevaluation of the patient after these medicines showed that the patient improved I have reviewed the patients home medicines and have made adjustments as needed   Social Determinants of Health:  Former cigarette use.  Denies illicit drug use.   Test / Admission - Considered:  Generalized abdominal pain Vitals signs  within normal range and stable throughout  visit. Laboratory/imaging studies significant for: See above *** Worrisome signs and symptoms were discussed with the patient, and the patient acknowledged understanding to return to the ED if noticed. Patient was stable upon discharge.    {Document critical care time when appropriate:1} {Document review of labs and clinical decision tools ie heart score, Chads2Vasc2 etc:1}  {Document your independent review of radiology images, and any outside records:1} {Document your discussion with family members, caretakers, and with consultants:1} {Document social determinants of health affecting pt's care:1} {Document your decision making why or why not admission, treatments were needed:1} Final Clinical Impression(s) / ED Diagnoses Final diagnoses:  None    Rx / DC Orders ED Discharge Orders     None

## 2022-09-08 NOTE — Discharge Instructions (Addendum)
As discussed, we will try different medication to use as needed for nausea/vomiting.  CT imaging did show moderate amount of stool in your colon which could be causing some discomfort; recommend adding fiber supplementation to your diet as well as MiraLAX as needed to help invoked bowel movement.  Recommend bland diet over the next several days until you are able to tolerate more complex foods.  Call to set up appointment with gastroenterology for repeat assessment.  Please do not hesitate to return to emergency department for worrisome signs and symptoms we discussed become apparent.

## 2022-09-09 DIAGNOSIS — N186 End stage renal disease: Secondary | ICD-10-CM | POA: Diagnosis not present

## 2022-09-09 DIAGNOSIS — K8080 Other cholelithiasis without obstruction: Secondary | ICD-10-CM | POA: Diagnosis not present

## 2022-09-09 DIAGNOSIS — R112 Nausea with vomiting, unspecified: Secondary | ICD-10-CM | POA: Diagnosis not present

## 2022-09-09 DIAGNOSIS — E1165 Type 2 diabetes mellitus with hyperglycemia: Secondary | ICD-10-CM | POA: Diagnosis not present

## 2022-09-10 ENCOUNTER — Telehealth: Payer: Self-pay | Admitting: Pharmacist

## 2022-09-10 DIAGNOSIS — N2581 Secondary hyperparathyroidism of renal origin: Secondary | ICD-10-CM | POA: Diagnosis not present

## 2022-09-10 DIAGNOSIS — D509 Iron deficiency anemia, unspecified: Secondary | ICD-10-CM | POA: Diagnosis not present

## 2022-09-10 DIAGNOSIS — Z992 Dependence on renal dialysis: Secondary | ICD-10-CM | POA: Diagnosis not present

## 2022-09-10 DIAGNOSIS — D689 Coagulation defect, unspecified: Secondary | ICD-10-CM | POA: Diagnosis not present

## 2022-09-10 DIAGNOSIS — R110 Nausea: Secondary | ICD-10-CM | POA: Diagnosis not present

## 2022-09-10 DIAGNOSIS — N186 End stage renal disease: Secondary | ICD-10-CM | POA: Diagnosis not present

## 2022-09-10 NOTE — Progress Notes (Signed)
Care Management & Coordination Services Pharmacy Team   Reason for Encounter: Hypertension   Contacted patient to discuss hypertension disease state. Unsuccessful outreach. Left voicemail for patient to return call.    Current antihypertensive regimen:  amiodarone (PACERONE) 200 MG tablet  hydrALAZINE (APRESOLINE) 25 MG tablet  Patient verbally confirms she is taking the above medications as directed.   How often are you checking your Blood Pressure?   she checks her blood pressure   taking her medication.  Current home BP readings:   DATE:             BP               PULSE   Wrist or arm cuff: Caffeine intake: Salt intake: OTC medications including pseudoephedrine or NSAIDs?  Any readings above 180/100?  If yes any symptoms of hypertensive emergency?   What recent interventions/DTPs have been made by any provider to improve Blood Pressure control since last CPP Visit:    Any recent hospitalizations or ED visits since last visit with CPP?   What diet changes have been made to improve Blood Pressure Control?    What exercise is being done to improve your Blood Pressure Control?    Adherence Review: Is the patient currently on ACE/ARB medication? No Does the patient have >5 day gap between last estimated fill dates? No  Star Rating Drugs:  Medication:  Last Fill: Day Supply  No Star Rating Drugs Noted  Chart Updates: Recent office visits:    Recent consult visits:    Hospital visits: 09/08/22 Medication Reconciliation was completed by comparing discharge summary, patient's EMR and Pharmacy list, and upon discussion with patient.  Admitted to the hospital on 09/08/22 due to Abdominal pain. Discharge date was 09/08/22. Discharged from [facility].    New?Medications Started at [facility] Discharge:?? metoCLOPramide  Medication Changes at Hospital Discharge: None noted  Medications Discontinued at Hospital Discharge: None noted  Medications  that remain the same after Hospital Discharge:??  All other medications will remain the same.    Hospital visits: 08/26/22-09/05/22 Medication Reconciliation was completed by comparing discharge summary, patient's EMR and Pharmacy list, and upon discussion with patient.  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 Discharge date was 09/05/22. Discharged from [facility].    New?Medications Started at [facility] Discharge:?? apixaban (ELIQUIS) atorvastatin (LIPITOR) nystatin (MYCOSTATIN)  Medication Changes at Hospital Discharge: amiodarone (PACERONE) Lantus SoloStar (insulin glargine) pantoprazole (Protonix)  Medications Discontinued at Hospital Discharge: aspirin EC 81 MG tablet feeding supplement (PRO-STAT SUGAR FREE 64) Liqd fluconazole 150 MG tablet (DIFLUCAN) hydrALAZINE 25 MG tablet (APRESOLINE)  Medications that remain the same after Hospital Discharge:??  All other medications will remain the same.   Hospital visits: 08/20/22 Medication Reconciliation was completed by comparing discharge summary, patient's EMR and Pharmacy list, and upon discussion with patient.  Admitted to the hospital on 07/26/22 due to ESRD. Discharge date was 07/26/22. Discharged from [facility].    New?Medications Started at [facility] Discharge:?? None noted  Medication Changes at Hospital Discharge: None noted  Medications Discontinued at Hospital Discharge: None noted  Medications that remain the same after Hospital Discharge:??  All other medications will remain the same.   Medications: Outpatient Encounter Medications as of 09/10/2022  Medication Sig   albuterol (VENTOLIN HFA) 108 (90 Base) MCG/ACT inhaler Inhale 1-2 puffs into the lungs every 6 (six) hours as needed for wheezing or shortness of breath.   amiodarone (PACERONE) 200 MG tablet  Take 200 mg 1 pill twice a day till 09/13/2022, from 09/14/2022 take 200 mg 1 pill once a day.   apixaban (ELIQUIS) 5 MG TABS  tablet Take 1 tablet (5 mg total) by mouth 2 (two) times daily.   ascorbic acid (VITAMIN C) 500 MG tablet Take 500 mg by mouth daily.   atorvastatin (LIPITOR) 80 MG tablet Take 1 tablet (80 mg total) by mouth daily.   blood glucose meter kit and supplies Use up to four times daily as directed.   Continuous Blood Gluc Sensor (FREESTYLE LIBRE 2 SENSOR) MISC    diphenoxylate-atropine (LOMOTIL) 2.5-0.025 MG tablet Take 1 tablet by mouth 4 (four) times daily as needed for diarrhea or loose stools.   ferrous sulfate 325 (65 FE) MG tablet Take 325 mg by mouth 2 (two) times daily. (1000 & 2100)   Fingerstix Lancets MISC Use as directed to check blood sugars as need up to 4 times daily   folic acid (FOLVITE) 1 MG tablet Take 1 mg by mouth daily.   hydrocortisone (ANUSOL-HC) 2.5 % rectal cream Place 1 Application rectally 2 (two) times daily. (1000 & 2100)   insulin aspart (NOVOLOG FLEXPEN) 100 UNIT/ML FlexPen Inject 0-15 Units into the skin See admin instructions. Inject 12 units subcutaneously 3 times daily with meals (0900, 1300 & 1700) & as needed via sliding scale insulin CBG <70=Notify NP/MD 71-149=0 units,150-199=4 units, 200-249=6 units,250- 299=10 units, 300-349=12 units, 350-399=15 units, > 400 call MD ( prime pen with 2 units prior  to set dose)   insulin glargine (LANTUS SOLOSTAR) 100 UNIT/ML Solostar Pen Inject 16 Units into the skin 2 (two) times daily.   Insulin Pen Needle 32G X 4 MM MISC Use as directed   levothyroxine (SYNTHROID) 75 MCG tablet Take 75 mcg by mouth daily before breakfast.   linagliptin (TRADJENTA) 5 MG TABS tablet Take 1 tablet (5 mg total) by mouth daily.   LORazepam (ATIVAN) 0.5 MG tablet Take 1 tablet (0.5 mg total) by mouth every 8 (eight) hours as needed for anxiety.   metoCLOPramide (REGLAN) 10 MG tablet Take 1 tablet (10 mg total) by mouth every 6 (six) hours as needed for nausea or vomiting.   midodrine (PROAMATINE) 10 MG tablet Take 10 mg by mouth every Monday,  Wednesday, and Friday with hemodialysis.   midodrine (PROAMATINE) 5 MG tablet Take 5 mg by mouth See admin instructions. Take 1 tablet (5 mg) by mouth with meals on Tuesdays, Thursdays, Saturdays & Sundays.   Multiple Vitamin (DAILY VITE) TABS Take 1 tablet by mouth daily.   Nutritional Supplements (FEEDING SUPPLEMENT, NEPRO CARB STEADY,) LIQD Take 237 mLs by mouth daily. (0900)   nystatin (MYCOSTATIN) 100000 UNIT/ML suspension Use as directed 5 mLs (500,000 Units total) in the mouth or throat 4 (four) times daily.   ondansetron (ZOFRAN) 4 MG tablet Take 4 mg by mouth every 8 (eight) hours as needed for nausea.   oxyCODONE-acetaminophen (PERCOCET/ROXICET) 5-325 MG tablet Take 1 tablet by mouth every 6 (six) hours as needed.   OXYGEN Inhale 2 L/min into the lungs at bedtime.   pantoprazole (PROTONIX) 40 MG tablet Take 1 tablet (40 mg total) by mouth 2 (two) times daily before a meal.   polyethylene glycol (MIRALAX / GLYCOLAX) 17 g packet Take 17 g by mouth 2 (two) times daily. (Patient taking differently: Take 17 g by mouth daily as needed (constipation.).)   pregabalin (LYRICA) 75 MG capsule Take 1 capsule (75 mg total) by mouth daily.  senna-docusate (SENOKOT-S) 8.6-50 MG tablet Take 2 tablets by mouth 2 (two) times daily.   sevelamer carbonate (RENVELA) 800 MG tablet Take 1,600 mg by mouth 3 (three) times daily with meals. (0800, 1200 & 1700)   vortioxetine HBr (TRINTELLIX) 20 MG TABS tablet Take 1 tablet (20 mg total) by mouth in the morning. (1000)   No facility-administered encounter medications on file as of 09/10/2022.    Recent Office Vitals: BP Readings from Last 3 Encounters:  09/08/22 (!) 102/34  09/05/22 115/69  08/20/22 104/63   Pulse Readings from Last 3 Encounters:  09/08/22 60  09/05/22 (!) 109  08/20/22 80    Wt Readings from Last 3 Encounters:  09/08/22 205 lb 0.4 oz (93 kg)  09/05/22 205 lb 14.6 oz (93.4 kg)  08/20/22 225 lb (102.1 kg)     Kidney Function Lab  Results  Component Value Date/Time   CREATININE 4.48 (H) 09/08/2022 09:46 AM   CREATININE 4.56 (H) 09/05/2022 12:51 PM   CREATININE 6.22 (H) 03/26/2022 04:20 PM   CREATININE 5.05 (H) 03/06/2022 03:23 PM   GFR 41.58 (L) 08/17/2013 02:46 PM   GFRNONAA 10 (L) 09/08/2022 09:46 AM   GFRNONAA 11 (L) 11/15/2020 02:52 PM   GFRAA 13 (L) 11/15/2020 02:52 PM       Latest Ref Rng & Units 09/08/2022    9:46 AM 09/05/2022   12:51 PM 09/04/2022    7:57 AM  BMP  Glucose 70 - 99 mg/dL 161  096  045   BUN 8 - 23 mg/dL 38  26  33   Creatinine 0.44 - 1.00 mg/dL 4.09  8.11  9.14   Sodium 135 - 145 mmol/L 130  130  126   Potassium 3.5 - 5.1 mmol/L 4.5  3.9  4.0   Chloride 98 - 111 mmol/L 94  95  91   CO2 22 - 32 mmol/L 23  26  23    Calcium 8.9 - 10.3 mg/dL 9.6  8.5  8.6      Future Appointments  Date Time Provider Department Center  09/12/2022  1:30 PM [facility] PA/NP [facility] None  09/15/2022  1:00 PM [facility] VASC 1 - [facility]  09/15/2022  2:00 PM [facility] PA [facility]  09/30/2022  3:00 PM Nadara Mustard, MD [facility] None    [address] [place]

## 2022-09-11 ENCOUNTER — Telehealth: Payer: Self-pay

## 2022-09-11 DIAGNOSIS — Z992 Dependence on renal dialysis: Secondary | ICD-10-CM | POA: Diagnosis not present

## 2022-09-11 DIAGNOSIS — R2689 Other abnormalities of gait and mobility: Secondary | ICD-10-CM | POA: Diagnosis not present

## 2022-09-11 DIAGNOSIS — R41841 Cognitive communication deficit: Secondary | ICD-10-CM | POA: Diagnosis not present

## 2022-09-11 DIAGNOSIS — M6281 Muscle weakness (generalized): Secondary | ICD-10-CM | POA: Diagnosis not present

## 2022-09-11 DIAGNOSIS — I5042 Chronic combined systolic (congestive) and diastolic (congestive) heart failure: Secondary | ICD-10-CM | POA: Diagnosis not present

## 2022-09-11 DIAGNOSIS — J9611 Chronic respiratory failure with hypoxia: Secondary | ICD-10-CM | POA: Diagnosis not present

## 2022-09-11 NOTE — Telephone Encounter (Signed)
        Patient  visited [facility] on 4/15     Telephone encounter attempt :  1st  A HIPAA compliant voice message was left requesting a return call.  Instructed patient to call back    Lenard Forth [facility] Guide, [facility] [phone] [address] [place], [place], [place] [address] Phone: [phone] Email: [email or web address]

## 2022-09-12 ENCOUNTER — Encounter (HOSPITAL_COMMUNITY): Payer: PPO

## 2022-09-12 ENCOUNTER — Telehealth: Payer: Self-pay

## 2022-09-12 NOTE — Telephone Encounter (Signed)
        Patient  visited [facility] on 4/15    Telephone encounter attempt :  2nd  A HIPAA compliant voice message was left requesting a return call.  Instructed patient to call back.    Lenard Forth [facility] Guide, [facility] [phone] [address] [place], [place], [place] [address] Phone: [phone] Email: [email or web address]

## 2022-09-15 ENCOUNTER — Ambulatory Visit (HOSPITAL_COMMUNITY)
Admission: RE | Admit: 2022-09-15 | Discharge: 2022-09-15 | Disposition: A | Discharge status: Home / Self Care | Payer: PPO | Source: Ambulatory Visit | Attending: Surgery | Admitting: Surgery

## 2022-09-15 ENCOUNTER — Ambulatory Visit (INDEPENDENT_AMBULATORY_CARE_PROVIDER_SITE_OTHER): Payer: PPO | Admitting: Physician Assistant

## 2022-09-15 VITALS — BP 140/78 | HR 99 | Temp 97.6°F

## 2022-09-15 DIAGNOSIS — I480 Paroxysmal atrial fibrillation: Secondary | ICD-10-CM | POA: Diagnosis not present

## 2022-09-15 DIAGNOSIS — M6281 Muscle weakness (generalized): Secondary | ICD-10-CM | POA: Diagnosis not present

## 2022-09-15 DIAGNOSIS — J9611 Chronic respiratory failure with hypoxia: Secondary | ICD-10-CM | POA: Diagnosis not present

## 2022-09-15 DIAGNOSIS — L89223 Pressure ulcer of left hip, stage 3: Secondary | ICD-10-CM | POA: Diagnosis not present

## 2022-09-15 DIAGNOSIS — Z992 Dependence on renal dialysis: Secondary | ICD-10-CM

## 2022-09-15 DIAGNOSIS — R2689 Other abnormalities of gait and mobility: Secondary | ICD-10-CM | POA: Diagnosis not present

## 2022-09-15 DIAGNOSIS — N186 End stage renal disease: Secondary | ICD-10-CM | POA: Diagnosis not present

## 2022-09-15 DIAGNOSIS — E1165 Type 2 diabetes mellitus with hyperglycemia: Secondary | ICD-10-CM | POA: Diagnosis not present

## 2022-09-15 DIAGNOSIS — R41841 Cognitive communication deficit: Secondary | ICD-10-CM | POA: Diagnosis not present

## 2022-09-15 DIAGNOSIS — I5042 Chronic combined systolic (congestive) and diastolic (congestive) heart failure: Secondary | ICD-10-CM | POA: Diagnosis not present

## 2022-09-15 DIAGNOSIS — R109 Unspecified abdominal pain: Secondary | ICD-10-CM | POA: Diagnosis not present

## 2022-09-15 DIAGNOSIS — L89153 Pressure ulcer of sacral region, stage 3: Secondary | ICD-10-CM | POA: Diagnosis not present

## 2022-09-15 NOTE — Progress Notes (Signed)
Postoperative Access Visit   History of Present Illness   Susan [name] is a 72 y.o. year old female who presents for postoperative follow-up for: Revision of left basilic vein fistula and thrombectomy of basilic vein fistula on 08/20/22 by Dr. Myra Gianotti. She had undergone a second stage basilic vein transposition and soon after presented with thrombosed fistula. Dr. Myra Gianotti recommended attempt at thrombectomy vs graft. He able to successfully thrombectomize the fistula but did note a sclerotic portion of the proximal fistula. She presents today and her The wounds are well healed.  The patient notes no steal symptoms. She is currently dialyzing via a right IJ TDC on MWF.   Physical Examination   Vitals:   09/15/22 1353  BP: (!) 140/78  Pulse: 99  Temp: 97.6 F (36.4 C)  TempSrc: Temporal  SpO2: 100%   There is no height or weight on file to calculate BMI.  left arm Incision is well healed, 2+ radial pulse, hand grip is 5/5, sensation in digits is intact, palpable thrill, bruit can be auscultated    Non invasive Vascular lab: Findings:  +--------------------+----------+-----------------+--------+  AVF                PSV (cm/s)Flow Vol (mL/min)Comments  +--------------------+----------+-----------------+--------+  Native artery inflow   237           663                 +--------------------+----------+-----------------+--------+  AVF Anastomosis        323                               +--------------------+----------+-----------------+--------+     +------------+----------+-------------+----------+--------+  OUTFLOW VEINPSV (cm/s)Diameter (cm)Depth (cm)Describe  +------------+----------+-------------+----------+--------+  Prox UA        639        0.31        0.77             +------------+----------+-------------+----------+--------+  Mid UA         283        0.45        0.26              +------------+----------+-------------+----------+--------+  Dist UA        205        0.44        0.38             +------------+----------+-------------+----------+--------+  AC Fossa       221        0.54        0.26             +------------+----------+-------------+----------+--------+     Summary:  Patent left BVT. Velocity ratio of 3.4 in the proximal upper arm outflow vein at narrow section of vein.   Medical Decision Making   Susan [name] is a 72 y.o. year old female who presents s/p Revision of left basilic vein fistula and thrombectomy of basilic vein fistula on 08/20/22 by Dr. Myra Gianotti. Incisions are well healed. Fistula on exam has great thrill and is easily palpable in left upper arm. Her duplex today shows lower volume flow and vein is small. She may require fistulogram in near future if her fistula is malfunctioning but clinically the fistula is patent and is easily palpable in the left upper arm. I have recommended allowing it to be cannulated first to see if it works for dialysis prior  to scheduling fistulogram. If her dialysis center calls with concerns of fistula not working fistulogram can be scheduled over the phone.  Patent is without signs or symptoms of steal syndrome The patient's access will be ready for use after 09/20/22 The patient's tunneled dialysis catheter can be removed when Nephrology is comfortable with the performance of the left AV fistula The patient may follow up on a prn basis   Graceann Congress, PA-C [facility] Office: [phone]  Clinic MD: Myra Gianotti

## 2022-09-16 DIAGNOSIS — D509 Iron deficiency anemia, unspecified: Secondary | ICD-10-CM | POA: Diagnosis not present

## 2022-09-16 DIAGNOSIS — Z992 Dependence on renal dialysis: Secondary | ICD-10-CM | POA: Diagnosis not present

## 2022-09-16 DIAGNOSIS — N186 End stage renal disease: Secondary | ICD-10-CM | POA: Diagnosis not present

## 2022-09-16 DIAGNOSIS — N2581 Secondary hyperparathyroidism of renal origin: Secondary | ICD-10-CM | POA: Diagnosis not present

## 2022-09-16 DIAGNOSIS — Z23 Encounter for immunization: Secondary | ICD-10-CM | POA: Diagnosis not present

## 2022-09-16 DIAGNOSIS — D689 Coagulation defect, unspecified: Secondary | ICD-10-CM | POA: Diagnosis not present

## 2022-09-17 DIAGNOSIS — R41841 Cognitive communication deficit: Secondary | ICD-10-CM | POA: Diagnosis not present

## 2022-09-17 DIAGNOSIS — R251 Tremor, unspecified: Secondary | ICD-10-CM | POA: Diagnosis not present

## 2022-09-17 DIAGNOSIS — R2689 Other abnormalities of gait and mobility: Secondary | ICD-10-CM | POA: Diagnosis not present

## 2022-09-17 DIAGNOSIS — M6281 Muscle weakness (generalized): Secondary | ICD-10-CM | POA: Diagnosis not present

## 2022-09-17 DIAGNOSIS — I69828 Other speech and language deficits following other cerebrovascular disease: Secondary | ICD-10-CM | POA: Diagnosis not present

## 2022-09-17 DIAGNOSIS — R1312 Dysphagia, oropharyngeal phase: Secondary | ICD-10-CM | POA: Diagnosis not present

## 2022-09-17 DIAGNOSIS — I5043 Acute on chronic combined systolic (congestive) and diastolic (congestive) heart failure: Secondary | ICD-10-CM | POA: Diagnosis not present

## 2022-09-17 NOTE — Addendum Note (Signed)
Addendum  created 09/17/22 1334 by Jodell Cipro, CRNA   Intraprocedure Meds edited

## 2022-09-18 DIAGNOSIS — I5042 Chronic combined systolic (congestive) and diastolic (congestive) heart failure: Secondary | ICD-10-CM | POA: Diagnosis not present

## 2022-09-18 DIAGNOSIS — J9611 Chronic respiratory failure with hypoxia: Secondary | ICD-10-CM | POA: Diagnosis not present

## 2022-09-18 DIAGNOSIS — R2689 Other abnormalities of gait and mobility: Secondary | ICD-10-CM | POA: Diagnosis not present

## 2022-09-18 DIAGNOSIS — Z992 Dependence on renal dialysis: Secondary | ICD-10-CM | POA: Diagnosis not present

## 2022-09-18 DIAGNOSIS — M6281 Muscle weakness (generalized): Secondary | ICD-10-CM | POA: Diagnosis not present

## 2022-09-18 DIAGNOSIS — R41841 Cognitive communication deficit: Secondary | ICD-10-CM | POA: Diagnosis not present

## 2022-09-22 DIAGNOSIS — L89153 Pressure ulcer of sacral region, stage 3: Secondary | ICD-10-CM | POA: Diagnosis not present

## 2022-09-22 DIAGNOSIS — R41841 Cognitive communication deficit: Secondary | ICD-10-CM | POA: Diagnosis not present

## 2022-09-22 DIAGNOSIS — J9611 Chronic respiratory failure with hypoxia: Secondary | ICD-10-CM | POA: Diagnosis not present

## 2022-09-22 DIAGNOSIS — D689 Coagulation defect, unspecified: Secondary | ICD-10-CM | POA: Diagnosis not present

## 2022-09-22 DIAGNOSIS — R2689 Other abnormalities of gait and mobility: Secondary | ICD-10-CM | POA: Diagnosis not present

## 2022-09-22 DIAGNOSIS — E1165 Type 2 diabetes mellitus with hyperglycemia: Secondary | ICD-10-CM | POA: Diagnosis not present

## 2022-09-22 DIAGNOSIS — R109 Unspecified abdominal pain: Secondary | ICD-10-CM | POA: Diagnosis not present

## 2022-09-22 DIAGNOSIS — N186 End stage renal disease: Secondary | ICD-10-CM | POA: Diagnosis not present

## 2022-09-22 DIAGNOSIS — I480 Paroxysmal atrial fibrillation: Secondary | ICD-10-CM | POA: Diagnosis not present

## 2022-09-22 DIAGNOSIS — N2581 Secondary hyperparathyroidism of renal origin: Secondary | ICD-10-CM | POA: Diagnosis not present

## 2022-09-22 DIAGNOSIS — Z992 Dependence on renal dialysis: Secondary | ICD-10-CM | POA: Diagnosis not present

## 2022-09-22 DIAGNOSIS — M6281 Muscle weakness (generalized): Secondary | ICD-10-CM | POA: Diagnosis not present

## 2022-09-22 DIAGNOSIS — I5042 Chronic combined systolic (congestive) and diastolic (congestive) heart failure: Secondary | ICD-10-CM | POA: Diagnosis not present

## 2022-09-23 DIAGNOSIS — N179 Acute kidney failure, unspecified: Secondary | ICD-10-CM | POA: Diagnosis not present

## 2022-09-23 DIAGNOSIS — I5043 Acute on chronic combined systolic (congestive) and diastolic (congestive) heart failure: Secondary | ICD-10-CM | POA: Diagnosis not present

## 2022-09-23 DIAGNOSIS — E1165 Type 2 diabetes mellitus with hyperglycemia: Secondary | ICD-10-CM | POA: Diagnosis not present

## 2022-09-23 DIAGNOSIS — J9611 Chronic respiratory failure with hypoxia: Secondary | ICD-10-CM | POA: Diagnosis not present

## 2022-09-23 DIAGNOSIS — I10 Essential (primary) hypertension: Secondary | ICD-10-CM | POA: Diagnosis not present

## 2022-09-23 DIAGNOSIS — E039 Hypothyroidism, unspecified: Secondary | ICD-10-CM | POA: Diagnosis not present

## 2022-09-23 DIAGNOSIS — Z992 Dependence on renal dialysis: Secondary | ICD-10-CM | POA: Diagnosis not present

## 2022-09-23 DIAGNOSIS — K219 Gastro-esophageal reflux disease without esophagitis: Secondary | ICD-10-CM | POA: Diagnosis not present

## 2022-09-23 DIAGNOSIS — F411 Generalized anxiety disorder: Secondary | ICD-10-CM | POA: Diagnosis not present

## 2022-09-23 DIAGNOSIS — K626 Ulcer of anus and rectum: Secondary | ICD-10-CM | POA: Diagnosis not present

## 2022-09-23 DIAGNOSIS — M6281 Muscle weakness (generalized): Secondary | ICD-10-CM | POA: Diagnosis not present

## 2022-09-23 DIAGNOSIS — I5042 Chronic combined systolic (congestive) and diastolic (congestive) heart failure: Secondary | ICD-10-CM | POA: Diagnosis not present

## 2022-09-23 DIAGNOSIS — N186 End stage renal disease: Secondary | ICD-10-CM | POA: Diagnosis not present

## 2022-09-25 DIAGNOSIS — Z992 Dependence on renal dialysis: Secondary | ICD-10-CM | POA: Diagnosis not present

## 2022-09-25 DIAGNOSIS — N186 End stage renal disease: Secondary | ICD-10-CM | POA: Diagnosis not present

## 2022-09-25 DIAGNOSIS — D689 Coagulation defect, unspecified: Secondary | ICD-10-CM | POA: Diagnosis not present

## 2022-09-25 DIAGNOSIS — N2581 Secondary hyperparathyroidism of renal origin: Secondary | ICD-10-CM | POA: Diagnosis not present

## 2022-09-28 DIAGNOSIS — I480 Paroxysmal atrial fibrillation: Secondary | ICD-10-CM | POA: Diagnosis not present

## 2022-09-28 DIAGNOSIS — D631 Anemia in chronic kidney disease: Secondary | ICD-10-CM | POA: Diagnosis not present

## 2022-09-28 DIAGNOSIS — E1122 Type 2 diabetes mellitus with diabetic chronic kidney disease: Secondary | ICD-10-CM | POA: Diagnosis not present

## 2022-09-28 DIAGNOSIS — E669 Obesity, unspecified: Secondary | ICD-10-CM | POA: Diagnosis not present

## 2022-09-28 DIAGNOSIS — L89320 Pressure ulcer of left buttock, unstageable: Secondary | ICD-10-CM | POA: Diagnosis not present

## 2022-09-28 DIAGNOSIS — L89150 Pressure ulcer of sacral region, unstageable: Secondary | ICD-10-CM | POA: Diagnosis not present

## 2022-09-28 DIAGNOSIS — Z992 Dependence on renal dialysis: Secondary | ICD-10-CM | POA: Diagnosis not present

## 2022-09-28 DIAGNOSIS — Z9981 Dependence on supplemental oxygen: Secondary | ICD-10-CM | POA: Diagnosis not present

## 2022-09-28 DIAGNOSIS — Z794 Long term (current) use of insulin: Secondary | ICD-10-CM | POA: Diagnosis not present

## 2022-09-28 DIAGNOSIS — E039 Hypothyroidism, unspecified: Secondary | ICD-10-CM | POA: Diagnosis not present

## 2022-09-28 DIAGNOSIS — E785 Hyperlipidemia, unspecified: Secondary | ICD-10-CM | POA: Diagnosis not present

## 2022-09-28 DIAGNOSIS — I252 Old myocardial infarction: Secondary | ICD-10-CM | POA: Diagnosis not present

## 2022-09-28 DIAGNOSIS — I130 Hypertensive heart and chronic kidney disease with heart failure and stage 1 through stage 4 chronic kidney disease, or unspecified chronic kidney disease: Secondary | ICD-10-CM | POA: Diagnosis not present

## 2022-09-28 DIAGNOSIS — F32A Depression, unspecified: Secondary | ICD-10-CM | POA: Diagnosis not present

## 2022-09-28 DIAGNOSIS — Z7901 Long term (current) use of anticoagulants: Secondary | ICD-10-CM | POA: Diagnosis not present

## 2022-09-28 DIAGNOSIS — I5042 Chronic combined systolic (congestive) and diastolic (congestive) heart failure: Secondary | ICD-10-CM | POA: Diagnosis not present

## 2022-09-28 DIAGNOSIS — I872 Venous insufficiency (chronic) (peripheral): Secondary | ICD-10-CM | POA: Diagnosis not present

## 2022-09-28 DIAGNOSIS — L89310 Pressure ulcer of right buttock, unstageable: Secondary | ICD-10-CM | POA: Diagnosis not present

## 2022-09-28 DIAGNOSIS — I2510 Atherosclerotic heart disease of native coronary artery without angina pectoris: Secondary | ICD-10-CM | POA: Diagnosis not present

## 2022-09-28 DIAGNOSIS — Z89511 Acquired absence of right leg below knee: Secondary | ICD-10-CM | POA: Diagnosis not present

## 2022-09-28 DIAGNOSIS — J9610 Chronic respiratory failure, unspecified whether with hypoxia or hypercapnia: Secondary | ICD-10-CM | POA: Diagnosis not present

## 2022-09-28 DIAGNOSIS — Z6841 Body Mass Index (BMI) 40.0 and over, adult: Secondary | ICD-10-CM | POA: Diagnosis not present

## 2022-09-28 DIAGNOSIS — N186 End stage renal disease: Secondary | ICD-10-CM | POA: Diagnosis not present

## 2022-09-28 DIAGNOSIS — E1151 Type 2 diabetes mellitus with diabetic peripheral angiopathy without gangrene: Secondary | ICD-10-CM | POA: Diagnosis not present

## 2022-09-29 ENCOUNTER — Telehealth: Payer: Self-pay

## 2022-09-29 ENCOUNTER — Ambulatory Visit (INDEPENDENT_AMBULATORY_CARE_PROVIDER_SITE_OTHER): Payer: PPO | Admitting: Family Medicine

## 2022-09-29 ENCOUNTER — Encounter: Payer: Self-pay | Admitting: Family Medicine

## 2022-09-29 VITALS — BP 126/62 | HR 68 | Temp 97.6°F | Ht 66.0 in | Wt 205.0 lb

## 2022-09-29 DIAGNOSIS — E119 Type 2 diabetes mellitus without complications: Secondary | ICD-10-CM

## 2022-09-29 DIAGNOSIS — N186 End stage renal disease: Secondary | ICD-10-CM | POA: Diagnosis not present

## 2022-09-29 DIAGNOSIS — I480 Paroxysmal atrial fibrillation: Secondary | ICD-10-CM | POA: Diagnosis not present

## 2022-09-29 DIAGNOSIS — Z794 Long term (current) use of insulin: Secondary | ICD-10-CM | POA: Diagnosis not present

## 2022-09-29 LAB — GLUCOSE 16585: Glucose: >444 mg/dL — ABNORMAL HIGH (ref 65–99)

## 2022-09-29 MED ORDER — LORAZEPAM 0.5 MG PO TABS: 0.5000 mg | ORAL_TABLET | Freq: Every day | ORAL | 1 refills | Status: DC

## 2022-09-29 NOTE — Telephone Encounter (Signed)
Tonya a [facility] RN called in to get verbal orders for pt:  Nursing for wound care; 2x a week for 1 week 1x a week for 5 weeks  Also nurse wanted to know if dosage for oxygen 2L or 3L, and if pt is to check blood sugars 3x's or 4x's a day?   Please call [facility] on secure line at  [phone]

## 2022-09-29 NOTE — Progress Notes (Signed)
Subjective:   Admit date: 08/26/2022  Discharge date: 09/05/2022   Admitted From: SNF   Disposition:  SNF     Recommendations for Outpatient Follow-up:    Follow up with PCP in 1-2 weeks   PCP Please obtain BMP/CBC, 2 view CXR in 1week,  (see Discharge instructions)    PCP Please follow up on the following pending results:      Home Health: None   Equipment/Devices: None  Consultations: GI, Renal Discharge Condition: Stable    CODE STATUS: Full    Diet Recommendation:-Low carbohydrate diet, 1.2 L fluid restriction per day.  Check CBGs q. ACH S.        Chief Complaint  Patient presents with   Abdominal Pain      Brief history of present illness from the day of admission and additional interim summary     72 yo F with ESRD, diastolic HF, peripheral neuropathy, HTN, hypothyroidism, and T2DM presenting with nausea, vomiting, and abdominal pain. These symptoms developed after eating mac and cheese and ham leftover from Easter. Imaging notable for proctitis with mesenteric congestive changes in the lower abdomen and pelvis. Also with notable RUQ TTP, RUQ US shows distended gallbladder with small stones and trace pericholecystic fluid. Her workup was consistent with proctocolitis, splenic and right renal infarct of unclear etiology, wide-complex tachycardia for which cardiology was consulted presumed to be A-fib with aberrancy versus nonsustained V. tach. She is so far been seen by cardiology, GI and renal. Transferred to my care on 09/03/2022 on day 7 of her hospital stay.                                                                   Hospital Course    Abdominal pain nausea vomiting imaging consistent of of proctocolitis - GI on board, nonspecific gallbladder changes on CT scan for which HIDA was done and unremarkable.  C. difficile stool study likely suggest colonization, she was seen by GI underwent EGD and flex sig on 09/03/2022 showing mild esophageal candidiasis for which she  has been started on a statin and feeling better, case discussed with GI physician Dr. Elnoria Howard in detail, she has completed antibiotic course for colitis.  Nystatin swish and swallow x 10 more days with stop date on 09/15/2022, GI symptoms have improved, stable for discharge with outpatient follow-up with PCP and GI in 1 to 2 weeks postdischarge.      Paroxysmal atrial fibrillation.  Wide-complex tachycardia while in the hospital -  [place] vas 2 score of greater than 4.  Seen by cardiology, echo shows a EF less than 35%, and by cardiology now transition from IV amiodarone to oral amiodarone on 09/04/2022 along with IV heparin to Eliquis. QTc has now improved to 442 ms.  Goal is rate control, outpatient follow-up with cardiology postdischarge in 1 to 2 weeks.   ESRD -  Has right IJ dialysis access.  On HD schedule per nephrology.  Volume removal through dialysis.   Right renal infarct, splenic infarct -  In the setting of underlying A-fib.  Anticoagulation as above.   Hypothyroidism -  On Synthroid.   Hypotension - on midodrine.   DM type II -  Long-acting insulin + ISS, check CBGs q. ACH S at  SNF and adjust as needed    09/29/22 I have not seen the patient since November 2023.  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  Subsequently thereafter, patient developed acute GI bleeding.  She was bleeding from a rectal ulcer.  GI attempted sigmoidoscopy x 2 and was unsuccessful.  Surgery had to perform an exam under anesthesia and suture the lesion to obtain hemostasis.  This was over [place].  She was then readmitted to hospital with stercoral colitis and was treated with Augmentin as well as vancomycin.  Most recently she was at the hospital with abdominal pain and was treated for colitis with antibiotics.  She also developed wide-complex tachycardia that cardiology felt was most likely atrial fibrillation with rapid ventricular response.  She is currently now on Eliquis and is  appropriately anticoagulated.  Here today in the office she is in normal sinus rhythm.  Her heart rate is well-controlled.  She denies any abdominal pain.  She has not seen her endocrinologist in quite some time.  Previously she was on an insulin pump.  Now she is on Lantus 20 units at night coupled with 15 units of aspart insulin with meals.  She now has continued blood close monitoring her sugars are always registering high which is greater than 500.  She has not seen the sugar below 500 since coming home from the rehab facility on April 30.  I checked his sugar with my meter today.  It is unreadable meaning greater than 400 on our meter Past Medical History:  Diagnosis Date   Acute MI (HCC) 1999; 2007; 03/05/21   Anemia    hx   Anginal pain (HCC)    Anxiety    ARF (acute renal failure) (HCC) 06/2017   [facility] Asso   Arthritis    "generalized" (03/15/2014)   CAD (coronary artery disease)    MI in 2000 - MI  2007 - treated bare metal stent (no nuclear since then as 9/11)   Carotid artery disease (HCC)    CHF (congestive heart failure) (HCC)    HFrEF 03/06/21   Chronic diastolic heart failure (HCC)    a) ECHO (08/2013) EF 55-60% and RV function nl b) RHC (08/2013) RA 4, RV 30/5/7, PA 25/10 (16), PCWP 7, Fick CO/CI 6.3/2.7, PVR 1.5 WU, PA 61 and 66%   Daily headache    "~ every other day; since I fell in June" (03/15/2014)   Depression    Dyslipidemia    Dyspnea    uses oxygen qhs via [place]   ESRD (end stage renal disease) (HCC)    Dialysis on Tues Thurs Sat   Exertional shortness of breath    History of kidney stones    HTN (hypertension)    Hypothyroidism    Neuropathy    Obesity    Osteoarthritis    PAF (paroxysmal atrial fibrillation) (HCC)    Peripheral neuropathy    bilateral feet/hands   PONV (postoperative nausea and vomiting)    RBBB (right bundle branch block)    Old   Stroke (HCC)    mini strokes   Syncope    likely due to low blood sugar   Tachycardia     Sinus tachycardia   Type II diabetes mellitus (HCC)    Type II, [name]. patient has omnipod insulin pump with Novolin R Insulin   Urinary incontinence    Venous insufficiency    Past Surgical History:  Procedure Laterality Date   ABDOMINAL HYSTERECTOMY  1980's   AMPUTATION Right 02/24/2018   Procedure: RIGHT FOOT GREAT TOE AND 2ND TOE AMPUTATION;  Surgeon: Nadara Mustard, MD;  Location: [facility] OR;  Service: Orthopedics;  Laterality: Right;   AMPUTATION Right 04/30/2018   Procedure: RIGHT TRANSMETATARSAL AMPUTATION;  Surgeon: Nadara Mustard, MD;  Location: [facility] OR;  Service: Orthopedics;  Laterality: Right;   AMPUTATION Right 05/02/2022   Procedure: RIGHT BELOW KNEE AMPUTATION;  Surgeon: Nadara Mustard, MD;  Location[name] OR;  Service: Orthopedics;  Laterality: Right;   APPLICATION OF WOUND VAC Right 06/13/2022   Procedure: APPLICATION OF WOUND VAC;  Surgeon: Nadara Mustard, MD;  Location: [facility] OR;  Service: Orthopedics;  Laterality: Right;   AV FISTULA PLACEMENT Left 04/02/2022   Procedure: LEFT ARM ARTERIOVENOUS (AV) FISTULA CREATION;  Surgeon: Nada Libman, MD;  Location: [facility] OR;  Service: Vascular;  Laterality: Left;  PERIPHERAL NERVE BLOCK   BASCILIC VEIN TRANSPOSITION Left 07/31/2022   Procedure: LEFT ARM SECOND STAGE BASILIC VEIN TRANSPOSITION;  Surgeon: Nada Libman, MD;  Location: [facility] OR;  Service: Vascular;  Laterality: Left;   BIOPSY  05/27/2020   Procedure: BIOPSY;  Surgeon: Lanelle Bal, DO;  Location: [facility] ENDO SUITE;  Service: Endoscopy;;   CATARACT EXTRACTION, BILATERAL Bilateral ?2013   COLONOSCOPY W/ POLYPECTOMY     COLONOSCOPY WITH PROPOFOL N/A 03/13/2019   Procedure: COLONOSCOPY WITH PROPOFOL;  Surgeon: Beverley Fiedler, MD;  Location: [facility] ENDOSCOPY;  Service: Gastroenterology;  Laterality: N/A;   CORONARY ANGIOPLASTY WITH STENT PLACEMENT  1999; 2007   "1 + 1"   ERCP N/A 02/03/2022   Procedure: ENDOSCOPIC RETROGRADE CHOLANGIOPANCREATOGRAPHY (ERCP);  Surgeon:  Jeani Hawking, MD;  Location: [facility] ENDOSCOPY;  Service: Gastroenterology;  Laterality: N/A;   ESOPHAGOGASTRODUODENOSCOPY (EGD) WITH PROPOFOL N/A 03/13/2019   Procedure: ESOPHAGOGASTRODUODENOSCOPY (EGD) WITH PROPOFOL;  Surgeon: Beverley Fiedler, MD;  Location: [facility] ENDOSCOPY;  Service: Gastroenterology;  Laterality: N/A;   ESOPHAGOGASTRODUODENOSCOPY (EGD) WITH PROPOFOL N/A 05/27/2020   Procedure: ESOPHAGOGASTRODUODENOSCOPY (EGD) WITH PROPOFOL;  Surgeon: Lanelle Bal, DO;  [facility];  Service: Endoscopy;  Laterality: N/A;   ESOPHAGOGASTRODUODENOSCOPY (EGD) WITH PROPOFOL N/A 09/03/2022   Procedure: ESOPHAGOGASTRODUODENOSCOPY (EGD) WITH PROPOFOL;  Surgeon: Jeani Hawking, MD;  Location: [facility] ENDOSCOPY;  Service: Gastroenterology;  Laterality: N/A;   EYE SURGERY Bilateral    lazer   FLEXIBLE SIGMOIDOSCOPY N/A 05/23/2022   Procedure: FLEXIBLE SIGMOIDOSCOPY;  Surgeon: Jeani Hawking, MD;  Location: [facility] ENDOSCOPY;  Service: Gastroenterology;  Laterality: N/A;   FLEXIBLE SIGMOIDOSCOPY N/A 05/24/2022   Procedure: FLEXIBLE SIGMOIDOSCOPY;  Surgeon: Imogene Burn, MD;  Location: [facility] ENDOSCOPY;  Service: Gastroenterology;  Laterality: N/A;   FLEXIBLE SIGMOIDOSCOPY N/A 09/03/2022   Procedure: FLEXIBLE SIGMOIDOSCOPY;  Surgeon: Jeani Hawking, MD;  Location: [facility] ENDOSCOPY;  Service: Gastroenterology;  Laterality: N/A;   HEMOSTASIS CLIP PLACEMENT  03/13/2019   Procedure: HEMOSTASIS CLIP PLACEMENT;  Surgeon: Beverley Fiedler, MD;  Location: [facility] ENDOSCOPY;  Service: Gastroenterology;;   HEMOSTASIS CLIP PLACEMENT  05/23/2022   Procedure: HEMOSTASIS CLIP PLACEMENT;  Surgeon: Jeani Hawking, MD;  Location: [facility] ENDOSCOPY;  Service: Gastroenterology;;   HEMOSTASIS CONTROL  05/24/2022   Procedure: HEMOSTASIS CONTROL;  Surgeon: Imogene Burn, MD;  Location: [facility] ENDOSCOPY;  Service: Gastroenterology;;   HOT HEMOSTASIS N/A 05/23/2022   Procedure: HOT HEMOSTASIS (ARGON PLASMA COAGULATION/BICAP);  Surgeon: Jeani Hawking, MD;   Location: [facility] ENDOSCOPY;  Service: Gastroenterology;  Laterality: N/A;   I & D EXTREMITY Left 05/05/2022  Procedure: IRRIGATION AND DEBRIDEMENT LEFT ARM AV FISTULA;  Surgeon: Cephus Shelling, MD;  Location: [facility] OR;  Service: Vascular;  Laterality: Left;   INSERTION OF DIALYSIS CATHETER Right 04/02/2022   Procedure: INSERTION OF TUNNELED DIALYSIS CATHETER;  Surgeon: Nada Libman, MD;  Location: [facility] OR;  Service: Vascular;  Laterality: Right;   KNEE ARTHROSCOPY Left 10/25/2006   POLYPECTOMY  03/13/2019   Procedure: POLYPECTOMY;  Surgeon: Beverley Fiedler, MD;  Location: [facility] ENDOSCOPY;  Service: Gastroenterology;;   REMOVAL OF STONES  02/03/2022   Procedure: REMOVAL OF STONES;  Surgeon: Jeani Hawking, MD;  Location: [facility] ENDOSCOPY;  Service: Gastroenterology;;   REVISON OF ARTERIOVENOUS FISTULA Left 08/20/2022   Procedure: REVISON OF LEFT ARM ARTERIOVENOUS FISTULA;  Surgeon: Nada Libman, MD;  Location: [facility] OR;  Service: Vascular;  Laterality: Left;   RIGHT HEART CATH N/A 07/24/2017   Procedure: RIGHT HEART CATH;  Surgeon: Dolores Patty, MD;  Location: [facility] INVASIVE CV LAB;  Service: Cardiovascular;  Laterality: N/A;   RIGHT HEART CATHETERIZATION N/A 09/22/2013   Procedure: RIGHT HEART CATH;  Surgeon: Dolores Patty, MD;  Location: [facility] CATH LAB;  Service: Cardiovascular;  Laterality: N/A;   SHOULDER ARTHROSCOPY WITH OPEN ROTATOR CUFF REPAIR Right 03/14/2014   Procedure: RIGHT SHOULDER ARTHROSCOPY WITH BICEPS RELEASE, OPEN SUBSCAPULA REPAIR, OPEN SUPRASPINATUS REPAIR.;  Surgeon: Cammy Copa, MD;  Location: [facility] OR;  Service: Orthopedics;  Laterality: Right;   SPHINCTEROTOMY  02/03/2022   Procedure: SPHINCTEROTOMY;  Surgeon: Jeani Hawking, MD;  Location: [facility] ENDOSCOPY;  Service: Gastroenterology;;   STUMP REVISION Right 06/13/2022   Procedure: REVISION RIGHT BELOW KNEE AMPUTATION;  Surgeon: Nadara Mustard, MD;  Location: [facility] OR;  Service: Orthopedics;  Laterality: Right;   TEE WITHOUT  CARDIOVERSION N/A 02/04/2022   Procedure: TRANSESOPHAGEAL ECHOCARDIOGRAM (TEE);  Surgeon: Dolores Patty, MD;  Location: [facility] ENDOSCOPY;  Service: Cardiovascular;  Laterality: N/A;   THROMBECTOMY W/ EMBOLECTOMY Left 08/20/2022   Procedure: THROMBECTOMY OF LEFT ARM ARTERIOVENOUS FISTULA;  Surgeon: Nada Libman, MD;  Location: [facility] OR;  Service: Vascular;  Laterality: Left;   TOE AMPUTATION Right 02/24/2018   GREAT TOE AND 2ND TOE AMPUTATION   TUBAL LIGATION  1970's   Current Outpatient Medications on File Prior to Visit  Medication Sig Dispense Refill   albuterol (VENTOLIN HFA) 108 (90 Base) MCG/ACT inhaler Inhale 1-2 puffs into the lungs every 6 (six) hours as needed for wheezing or shortness of breath.     amiodarone (PACERONE) 200 MG tablet Take 200 mg 1 pill twice a day till 09/13/2022, from 09/14/2022 take 200 mg 1 pill once a day.     apixaban (ELIQUIS) 5 MG TABS tablet Take 1 tablet (5 mg total) by mouth 2 (two) times daily. 60 tablet    ascorbic acid (VITAMIN C) 500 MG tablet Take 500 mg by mouth daily.     atorvastatin (LIPITOR) 80 MG tablet Take 1 tablet (80 mg total) by mouth daily.     blood glucose meter kit and supplies Use up to four times daily as directed. 1 each 0   Continuous Blood Gluc Sensor (FREESTYLE LIBRE 2 SENSOR) MISC      diphenoxylate-atropine (LOMOTIL) 2.5-0.025 MG tablet Take 1 tablet by mouth 4 (four) times daily as needed for diarrhea or loose stools. 30 tablet 0   ferrous sulfate 325 (65 FE) MG tablet Take 325 mg by mouth 2 (two) times daily. (1000 & 2100)     Fingerstix Lancets MISC Use as directed  to check blood sugars as need up to 4 times daily 100 each 0   folic acid (FOLVITE) 1 MG tablet Take 1 mg by mouth daily.     hydrocortisone (ANUSOL-HC) 2.5 % rectal cream Place 1 Application rectally 2 (two) times daily. (1000 & 2100)     insulin aspart (NOVOLOG FLEXPEN) 100 UNIT/ML FlexPen Inject 0-15 Units into the skin See admin instructions. Inject 12 units  subcutaneously 3 times daily with meals (0900, 1300 & 1700) & as needed via sliding scale insulin CBG <70=Notify NP/MD 71-149=0 units,150-199=4 units, 200-249=6 units,250- 299=10 units, 300-349=12 units, 350-399=15 units, > 400 call MD ( prime pen with 2 units prior  to set dose) 15 mL 0   insulin glargine (LANTUS SOLOSTAR) 100 UNIT/ML Solostar Pen Inject 16 Units into the skin 2 (two) times daily. 15 mL 0   Insulin Pen Needle 32G X 4 MM MISC Use as directed 200 each 0   levothyroxine (SYNTHROID) 75 MCG tablet Take 75 mcg by mouth daily before breakfast.     linagliptin (TRADJENTA) 5 MG TABS tablet Take 1 tablet (5 mg total) by mouth daily. 30 tablet 0   LORazepam (ATIVAN) 0.5 MG tablet Take 1 tablet (0.5 mg total) by mouth every 8 (eight) hours as needed for anxiety. 5 tablet 0   metoCLOPramide (REGLAN) 10 MG tablet Take 1 tablet (10 mg total) by mouth every 6 (six) hours as needed for nausea or vomiting. 30 tablet 0   midodrine (PROAMATINE) 10 MG tablet Take 10 mg by mouth every Monday, Wednesday, and Friday with hemodialysis.     midodrine (PROAMATINE) 5 MG tablet Take 5 mg by mouth See admin instructions. Take 1 tablet (5 mg) by mouth with meals on Tuesdays, Thursdays, Saturdays & Sundays.     Multiple Vitamin (DAILY VITE) TABS Take 1 tablet by mouth daily.     Nutritional Supplements (FEEDING SUPPLEMENT, NEPRO CARB STEADY,) LIQD Take 237 mLs by mouth daily. (0900)     nystatin (MYCOSTATIN) 100000 UNIT/ML suspension Use as directed 5 mLs (500,000 Units total) in the mouth or throat 4 (four) times daily. 60 mL 0   ondansetron (ZOFRAN) 4 MG tablet Take 4 mg by mouth every 8 (eight) hours as needed for nausea.     oxyCODONE-acetaminophen (PERCOCET/ROXICET) 5-325 MG tablet Take 1 tablet by mouth every 6 (six) hours as needed. 20 tablet 0   OXYGEN Inhale 2 L/min into the lungs at bedtime.     pantoprazole (PROTONIX) 40 MG tablet Take 1 tablet (40 mg total) by mouth 2 (two) times daily before a meal. 30  tablet 0   polyethylene glycol (MIRALAX / GLYCOLAX) 17 g packet Take 17 g by mouth 2 (two) times daily. (Patient taking differently: Take 17 g by mouth daily as needed (constipation.).) 14 each 0   pregabalin (LYRICA) 75 MG capsule Take 1 capsule (75 mg total) by mouth daily. 2 capsule 0   senna-docusate (SENOKOT-S) 8.6-50 MG tablet Take 2 tablets by mouth 2 (two) times daily.     sevelamer carbonate (RENVELA) 800 MG tablet Take 1,600 mg by mouth 3 (three) times daily with meals. (0800, 1200 & 1700)     vortioxetine HBr (TRINTELLIX) 20 MG TABS tablet Take 1 tablet (20 mg total) by mouth in the morning. (1000) 30 tablet 5   No current facility-administered medications on file prior to visit.     Allergies  Allergen Reactions   Cephalexin Diarrhea and Other (See Comments)   Codeine Nausea And  Vomiting and Other (See Comments)   Social History   Socioeconomic History   Marital status: Married    Spouse name: Ramon Dredge   Number of children: 3   Years of education: 12th   Highest education level: Not on file  Occupational History    Employer: UNEMPLOYED  Tobacco Use   Smoking status: Former    Packs/day: 3.00    Years: 32.00    Additional pack years: 0.00    Total pack years: 96.00    Types: Cigarettes    Quit date: 10/24/1997    Years since quitting: 24.9   Smokeless tobacco: Never  Vaping Use   Vaping Use: Never used  Substance and Sexual Activity   Alcohol use: Not Currently    Comment: "might have 2-3 daiquiris in the summer"   Drug use: No   Sexual activity: Not Currently    Birth control/protection: Surgical    Comment: Hysterectomy  Other Topics Concern   Not on file  Social History Narrative   Pt lives at home with her spouse.Caffeine Use- 3 sodas daily.  Update:  now resides [facility]   Social Determinants of Health   Financial Resource Strain: Low Risk  (08/16/2021)   Overall Financial Resource Strain (CARDIA)    Difficulty of Paying Living Expenses: Not hard  at all  Food Insecurity: No Food Insecurity (08/27/2022)   Hunger Vital Sign    Worried About Running Out of Food in the Last Year: Never true    Ran Out of Food in the Last Year: Never true  Transportation Needs: No Transportation Needs (08/27/2022)   PRAPARE - Administrator, Civil Service (Medical): No    Lack of Transportation (Non-Medical): No  Physical Activity: Inactive (08/16/2021)   Exercise Vital Sign    Days of Exercise per Week: 0 days    Minutes of Exercise per Session: 0 min  Stress: No Stress Concern Present (08/16/2021)   [employer] of Occupational Health - Occupational Stress Questionnaire    Feeling of Stress : Not at all  Social Connections: Moderately Isolated (08/16/2021)   Social Connection and Isolation Panel [NHANES]    Frequency of Communication with Friends and Family: More than three times a week    Frequency of Social Gatherings with Friends and Family: More than three times a week    Attends Religious Services: Never    Database administrator or Organizations: No    Attends Banker Meetings: Never    Marital Status: Married  Catering manager Violence: Not At Risk (08/27/2022)   Humiliation, Afraid, Rape, and Kick questionnaire    Fear of Current or Ex-Partner: No    Emotionally Abused: No    Physically Abused: No    Sexually Abused: No     Review of Systems  Gastrointestinal:  Positive for diarrhea.       Objective:   Physical Exam Constitutional:      General: She is not in acute distress.    Appearance: Normal appearance. She is obese. She is not ill-appearing or toxic-appearing.  Cardiovascular:     Rate and Rhythm: Normal rate and regular rhythm.     Heart sounds: Normal heart sounds. No murmur heard.    No friction rub. No gallop.  Pulmonary:     Effort: Pulmonary effort is normal. No respiratory distress.     Breath sounds: Normal breath sounds. No stridor. No wheezing, rhonchi or rales.  Abdominal:  General: Bowel sounds are normal.     Palpations: Abdomen is soft.     Tenderness: There is no abdominal tenderness.  Musculoskeletal:     Right lower leg: Edema present.     Left lower leg: Edema present.  Skin:    General: Skin is warm.  Neurological:     Mental Status: She is alert.       Assessment & Plan:  Insulin dependent type 2 diabetes mellitus (HCC) - Plan: GLUCOSE 40981  ESRF (end stage renal failure) (HCC)  Paroxysmal atrial fibrillation (HCC) At the present time, I will defer her dialysis and renal failure management to her nephrologist.  She is receiving Tuesday Thursday Saturday dialysis.  Her blood pressure today is normal.  She seems euvolemic.  However her sugars are out of control.  Patient is very hesitant to increase her insulin however her sugars are greater than 400.  I recommended doubling her Lantus 40 and then rechecking with me in 48 hours to see how her sugars are responding.  The tentative plan to double Lantus until sugars begin to fall in the 200-300 range then likely uptitrate her rapid insulin depending upon her sugar values.  We will do this until she sees her endocrinologist on Friday

## 2022-09-30 ENCOUNTER — Ambulatory Visit: Payer: PPO | Admitting: Orthopedic Surgery

## 2022-09-30 DIAGNOSIS — N2581 Secondary hyperparathyroidism of renal origin: Secondary | ICD-10-CM | POA: Diagnosis not present

## 2022-09-30 DIAGNOSIS — Z992 Dependence on renal dialysis: Secondary | ICD-10-CM | POA: Diagnosis not present

## 2022-09-30 DIAGNOSIS — D689 Coagulation defect, unspecified: Secondary | ICD-10-CM | POA: Diagnosis not present

## 2022-09-30 DIAGNOSIS — N186 End stage renal disease: Secondary | ICD-10-CM | POA: Diagnosis not present

## 2022-10-01 ENCOUNTER — Encounter (HOSPITAL_COMMUNITY): Payer: PPO

## 2022-10-02 ENCOUNTER — Telehealth: Payer: Self-pay

## 2022-10-02 DIAGNOSIS — L89150 Pressure ulcer of sacral region, unstageable: Secondary | ICD-10-CM | POA: Diagnosis not present

## 2022-10-02 DIAGNOSIS — L89320 Pressure ulcer of left buttock, unstageable: Secondary | ICD-10-CM | POA: Diagnosis not present

## 2022-10-02 NOTE — Telephone Encounter (Signed)
Pt's husband called in wanted to give pcp pt's glucose readings:  Mon morning 5/6 - 560 after 20 units Tues morning 5/7 -342    evening 242 Wed morning  5/8 229     evening to high for meter to read (over 500) Thurs morning 5/9 327  Pt's husband stated that he wanted to let pcp know that he thinks that the 20 units is helping some. Please advise.  Cb#: [phone]

## 2022-10-03 DIAGNOSIS — E1165 Type 2 diabetes mellitus with hyperglycemia: Secondary | ICD-10-CM | POA: Diagnosis not present

## 2022-10-03 NOTE — Telephone Encounter (Signed)
Spoke w/pt's husband, Ramon Dredge, per pcp's recommendations,"Increase lantus to 60 units once a days and recheck next week. " Husband voiced understanding and nothing further.

## 2022-10-06 ENCOUNTER — Ambulatory Visit (INDEPENDENT_AMBULATORY_CARE_PROVIDER_SITE_OTHER): Payer: PPO | Admitting: Orthopedic Surgery

## 2022-10-06 ENCOUNTER — Encounter: Payer: Self-pay | Admitting: Orthopedic Surgery

## 2022-10-06 DIAGNOSIS — Z89511 Acquired absence of right leg below knee: Secondary | ICD-10-CM | POA: Diagnosis not present

## 2022-10-06 DIAGNOSIS — I87332 Chronic venous hypertension (idiopathic) with ulcer and inflammation of left lower extremity: Secondary | ICD-10-CM | POA: Diagnosis not present

## 2022-10-06 NOTE — Progress Notes (Signed)
Office Visit Note   Patient: Susan [name]           Date of Birth: 1951/01/19           MRN: [id number] Visit Date: 10/06/2022              Requested by: Donita Brooks, MD [address] [place],  [place] [address] PCP: Donita Brooks, MD  Chief Complaint  Patient presents with   Right Leg - Follow-up    06/13/2022 right BKA revision          HPI: Patient is a 72 year old woman status post revision right transtibial amputation 4 months ago who currently is in a stump shrinker on the right.  She states she developed a new venous ulcer on the lateral aspect of the left leg with weeping edema swelling and blistering.  Assessment & Plan: Visit Diagnoses:  1. Right below-knee amputee (HCC)   2. 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  Follow-up in 1 week.   Follow-Up Instructions: No follow-ups on file.   Ortho Exam  Patient is alert, oriented, no adenopathy, well-dressed, normal affect, normal respiratory effort. Examination patient has a well-healed right transtibial amputation with decreased swelling there is no ulcers.  Tenderness of the left leg the calf is 52 cm in circumference she has a large blistered ulcer over the lateral aspect of the left leg secondary to venous insufficiency.  The wound is 3 x 4 cm with weeping edema.  Previous ulcers have healed.  Imaging: No results found. No images are attached to the encounter.  Labs: Lab Results  Component Value Date   HGBA1C 7.9 (H) 08/27/2022   HGBA1C 8.9 (H) 05/02/2022   HGBA1C >15.5 (H) 01/20/2022   CRP 0.6 09/01/2022   CRP 0.7 08/22/2020   CRP 1.5 (H) 03/13/2020   LABURIC 4.7 08/09/2020   LABURIC 9.1 (H) 10/12/2018   LABURIC 9.0 (H) 03/06/2018   REPTSTATUS 08/30/2022 FINAL 08/29/2022   GRAMSTAIN  05/05/2022    FEW WBC PRESENT, PREDOMINANTLY PMN NO ORGANISMS SEEN    CULT  MULTIPLE SPECIES PRESENT, SUGGEST RECOLLECTION (A) 08/29/2022   LABORGA SERRATIA MARCESCENS 05/05/2022     Lab Results  Component Value Date   ALBUMIN 2.9 (L) 09/08/2022   ALBUMIN 2.2 (L) 09/05/2022   ALBUMIN 2.3 (L) 09/03/2022   PREALBUMIN 13 (L) 05/02/2022    Lab Results  Component Value Date   MG 1.9 09/04/2022   MG 1.8 09/03/2022   MG 1.6 (L) 09/02/2022   Lab Results  Component Value Date   VD25OH 42.14 05/02/2022    Lab Results  Component Value Date   PREALBUMIN 13 (L) 05/02/2022      Latest Ref Rng & Units 09/08/2022    9:46 AM 09/05/2022   12:51 PM 09/05/2022    6:00 AM  CBC EXTENDED  WBC 4.0 - 10.5 K/uL 5.2  4.5  5.1   RBC 3.87 - 5.11 MIL/uL 4.79  3.89  4.14   Hemoglobin 12.0 - 15.0 g/dL 19.1  9.1  9.8   HCT 47.8 - 46.0 % 38.4  31.2  32.7   Platelets 150 - 400 K/uL 228  176  184   NEUT# 1.7 - 7.7 K/uL 4.6     Lymph# 0.7 - 4.0 K/uL 0.3        There  is no height or weight on file to calculate BMI.  Orders:  No orders of the defined types were placed in this encounter.  No orders of the defined types were placed in this encounter.    Procedures: No procedures performed  Clinical Data: No additional findings.  ROS:  All other systems negative, except as noted in the HPI. Review of Systems  Objective: Vital Signs: There were no vitals taken for this visit.  Specialty Comments:  No specialty comments available.  PMFS History: Patient Active Problem List   Diagnosis Date Noted   Secondary hypercoagulable state (HCC) 09/04/2022   Right sided abdominal pain 08/31/2022   Constipation 06/07/2022   History of Clostridioides difficile colitis 06/06/2022   Below-knee amputation of right lower extremity (HCC) 06/06/2022   Diverticulitis 06/05/2022   Stercoral colitis 06/05/2022   C. 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) 04/07/2022   Allergy, unspecified, initial encounter 04/07/2022   Dependence on renal dialysis (HCC) 04/07/2022   Gout due to renal impairment, unspecified site 04/07/2022   Hypertensive heart and chronic kidney disease with heart failure and with stage 5 chronic kidney disease, or end stage renal disease (HCC) 04/07/2022   Personal history of transient ischemic attack (TIA), and cerebral infarction without residual deficits 04/07/2022   Renal osteodystrophy 04/07/2022   Venous stasis ulcer of right calf (HCC) 03/31/2022   Fistula, colovaginal 03/26/2022   Diarrhea 03/26/2022   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   Sepsis without acute organ dysfunction (HCC)    Bacteremia    Acute pancreatitis 02/01/2022   Abdominal pain 02/01/2022   SIRS (systemic inflammatory response syndrome) (HCC) 02/01/2022   Transaminitis 02/01/2022   History of anemia due to chronic kidney disease 02/01/2022   Paroxysmal atrial fibrillation (HCC) 02/01/2022   DKA (diabetic ketoacidosis) (HCC) 01/14/2022   NSTEMI (non-ST elevated myocardial infarction) (HCC) 03/05/2021   Acute renal failure superimposed on stage 4 chronic kidney disease (HCC) 08/22/2020   Hypoalbuminemia 05/25/2020   GERD (gastroesophageal reflux disease) 05/25/2020   Pressure injury of skin 05/17/2020   Acute on chronic combined systolic and diastolic congestive heart failure (HCC) 03/07/2020   Type 2 diabetes mellitus with diabetic polyneuropathy, with long-term current use of insulin (HCC) 03/07/2020   Obesity,  Class III, BMI 40-49.9 (morbid obesity) (HCC) 03/07/2020   Common bile duct (CBD) obstruction 05/28/2019   Benign neoplasm of ascending colon    Benign neoplasm of transverse colon    Benign neoplasm of descending colon    Benign neoplasm of sigmoid colon    Gastric polyps    Hyperkalemia 03/11/2019   Prolonged QT interval 03/11/2019   Acute blood loss anemia 03/11/2019   Onychomycosis 06/21/2018   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)    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 (HCC)    PVD (peripheral vascular disease) (HCC) 10/26/2017   E-coli UTI 07/27/2017   Hypothyroidism 07/27/2017   AKI (acute kidney injury) (HCC)    PAH (pulmonary  artery hypertension) (HCC)    Impaired ambulation 07/19/2017   Nausea & vomiting 07/15/2017   Leg cramps 02/27/2017   Peripheral edema 01/12/2017   Diabetic neuropathy (HCC) 11/12/2016   CKD (chronic kidney disease), stage IV (HCC) 10/24/2015   Anemia of chronic disease 10/03/2015   Generalized anxiety disorder 10/03/2015   Insomnia 10/03/2015   Hyperglycemia due to diabetes mellitus (HCC) 06/07/2015   Chronic diastolic CHF (congestive heart failure) (HCC) 06/07/2015   Non compliance with medical treatment 04/17/2014   Rotator cuff tear 03/14/2014   Class 3 obesity (HCC) 09/23/2013   Chronic HFrEF (heart failure with reduced ejection fraction) (HCC) 06/03/2013   Hypotension 12/25/2012   Urinary incontinence    MDD (major depressive disorder) 11/12/2010   RBBB (right bundle branch block)    Wide-complex tachycardia    Coronary artery disease    Hyperlipemia 01/22/2009   Essential hypertension 01/22/2009   Past Medical History:  Diagnosis Date   Acute MI (HCC) 1999; 2007; 03/05/21   Anemia    hx   Anginal pain (HCC)    Anxiety    ARF (acute renal failure) (HCC) 06/2017   [facility]   Arthritis    "generalized" (03/15/2014)   CAD (coronary artery disease)    MI in 2000 - MI  2007 - treated  bare metal stent (no nuclear since then as 9/11)   Carotid artery disease (HCC)    CHF (congestive heart failure) (HCC)    HFrEF 03/06/21   Chronic diastolic heart failure (HCC)    a) ECHO (08/2013) EF 55-60% and RV function nl b) RHC (08/2013) RA 4, RV 30/5/7, PA 25/10 (16), PCWP 7, Fick CO/CI 6.3/2.7, PVR 1.5 WU, PA 61 and 66%   Daily headache    "~ every other day; since I fell in June" (03/15/2014)   Depression    Dyslipidemia    Dyspnea    uses oxygen qhs via [place]   ESRD (end stage renal disease) (HCC)    Dialysis on Tues Thurs Sat   Exertional shortness of breath    History of kidney stones    HTN (hypertension)    Hypothyroidism    Neuropathy    Obesity    Osteoarthritis    PAF (paroxysmal atrial fibrillation) (HCC)    Peripheral neuropathy    bilateral feet/hands   PONV (postoperative nausea and vomiting)    RBBB (right bundle branch block)    Old   Stroke (HCC)    mini strokes   Syncope    likely due to low blood sugar   Tachycardia    Sinus tachycardia   Type II diabetes mellitus (HCC)    Type II, [name]. patient has omnipod insulin pump with Novolin R Insulin   Urinary incontinence    Venous insufficiency     Family History  Problem Relation Age of Onset   Heart attack Mother 21    Past Surgical History:  Procedure Laterality Date   ABDOMINAL HYSTERECTOMY  1980's   AMPUTATION Right 02/24/2018   Procedure: RIGHT FOOT GREAT TOE AND 2ND TOE AMPUTATION;  Surgeon: Nadara Mustard, MD;  Location: [facility] OR;  Service: Orthopedics;  Laterality: Right;   AMPUTATION Right 04/30/2018   Procedure: RIGHT TRANSMETATARSAL AMPUTATION;  Surgeon: Nadara Mustard, MD;  Location[facility] OR;  Service: Orthopedics;  Laterality: Right;   AMPUTATION Right 05/02/2022   Procedure: RIGHT BELOW KNEE AMPUTATION;  Surgeon: Nadara Mustard, MD;  Location: [facility] OR;  Service:  Orthopedics;  Laterality: Right;   APPLICATION OF WOUND VAC Right 06/13/2022   Procedure: APPLICATION OF WOUND  VAC;  Surgeon: Nadara Mustard, MD;  Location: [facility] OR;  Service: Orthopedics;  Laterality: Right;   AV FISTULA PLACEMENT Left 04/02/2022   Procedure: LEFT ARM ARTERIOVENOUS (AV) FISTULA CREATION;  Surgeon: Nada Libman, MD;  Location: [facility] OR;  Service: Vascular;  Laterality: Left;  PERIPHERAL NERVE BLOCK   BASCILIC VEIN TRANSPOSITION Left 07/31/2022   Procedure: LEFT ARM SECOND STAGE BASILIC VEIN TRANSPOSITION;  Surgeon: Nada Libman, MD;  Location: [facility] OR;  Service: Vascular;  Laterality: Left;   BIOPSY  05/27/2020   Procedure: BIOPSY;  Surgeon: Lanelle Bal, DO;  Location: [facility] END[facility] SUITE;  Service: Endoscopy;;   CATARACT EXTRACTION, BILATERAL Bilateral ?2013   COLONOSCOPY W/ POLYPECTOMY     COLONOSCOPY WITH PROPOFOL N/A 03/13/2019   Procedure: COLONOSCOPY WITH PROPOFOL;  Surgeon: Beverley Fiedler, MD;  Location: [facility] ENDOSCOPY;  Service: Gastroenterology;  Laterality: N/A;   CORONARY ANGIOPLASTY WITH STENT PLACEMENT  1999; 2007   "1 + 1"   ERCP N/A 02/03/2022   Procedure: ENDOSCOPIC RETROGRADE CHOLANGIOPANCREATOGRAPHY (ERCP);  Surgeon: Jeani Hawking, MD;  Location: [facility] ENDOSCOPY;  Service: Gastroenterology;  Laterality: N/A;   ESOPHAGOGASTRODUODENOSCOPY (EGD) WITH PROPOFOL N/A 03/13/2019   Procedure: ESOPHAGOGASTRODUODENOSCOPY (EGD) WITH PROPOFOL;  Surgeon: Beverley Fiedler, MD;  Location: [facility] ENDOSCOPY;  Service: Gastroenterology;  Laterality: N/A;   ESOPHAGOGASTRODUODENOSCOPY (EGD) WITH PROPOFOL N/A 05/27/2020   Procedure: ESOPHAGOGASTRODUODENOSCOPY (EGD) WITH PROPOFOL;  Surgeon: Lanelle Bal, DO;  [facility];  Service: Endoscopy;  Laterality: N/A;   ESOPHAGOGASTRODUODENOSCOPY (EGD) WITH PROPOFOL N/A 09/03/2022   Procedure: ESOPHAGOGASTRODUODENOSCOPY (EGD) WITH PROPOFOL;  Surgeon: Jeani Hawking, MD;  Location: [facility] ENDOSCOPY;  Service: Gastroenterology;  Laterality: N/A;   EYE SURGERY Bilateral    lazer   FLEXIBLE SIGMOIDOSCOPY N/A 05/23/2022   Procedure: FLEXIBLE SIGMOIDOSCOPY;   Surgeon: Jeani Hawking, MD;  Location: [facility] ENDOSCOPY;  Service: Gastroenterology;  Laterality: N/A;   FLEXIBLE SIGMOIDOSCOPY N/A 05/24/2022   Procedure: FLEXIBLE SIGMOIDOSCOPY;  Surgeon: Imogene Burn, MD;  Location: [facility] ENDOSCOPY;  Service: Gastroenterology;  Laterality: N/A;   FLEXIBLE SIGMOIDOSCOPY N/A 09/03/2022   Procedure: FLEXIBLE SIGMOIDOSCOPY;  Surgeon: Jeani Hawking, MD;  Location: [facility] ENDOSCOPY;  Service: Gastroenterology;  Laterality: N/A;   HEMOSTASIS CLIP PLACEMENT  03/13/2019   Procedure: HEMOSTASIS CLIP PLACEMENT;  Surgeon: Beverley Fiedler, MD;  Location: [facility] ENDOSCOPY;  Service: Gastroenterology;;   HEMOSTASIS CLIP PLACEMENT  05/23/2022   Procedure: HEMOSTASIS CLIP PLACEMENT;  Surgeon: Jeani Hawking, MD;  Location: [facility] ENDOSCOPY;  Service: Gastroenterology;;   HEMOSTASIS CONTROL  05/24/2022   Procedure: HEMOSTASIS CONTROL;  Surgeon: Imogene Burn, MD;  Location: [facility] ENDOSCOPY;  Service: Gastroenterology;;   HOT HEMOSTASIS N/A 05/23/2022   Procedure: HOT HEMOSTASIS (ARGON PLASMA COAGULATION/BICAP);  Surgeon: Jeani Hawking, MD;  Location: [facility] ENDOSCOPY;  Service: Gastroenterology;  Laterality: N/A;   I & D EXTREMITY Left 05/05/2022   Procedure: IRRIGATION AND DEBRIDEMENT LEFT ARM AV FISTULA;  Surgeon: Cephus Shelling, MD;  Location: [facility] OR;  Service: Vascular;  Laterality: Left;   INSERTION OF DIALYSIS CATHETER Right 04/02/2022   Procedure: INSERTION OF TUNNELED DIALYSIS CATHETER;  Surgeon: Nada Libman, MD;  Location: [facility] OR;  Service: Vascular;  Laterality: Right;   KNEE ARTHROSCOPY Left 10/25/2006   POLYPECTOMY  03/13/2019   Procedure: POLYPECTOMY;  Surgeon: Beverley Fiedler, MD;  Location: [facility] ENDOSCOPY;  Service: Gastroenterology;;   REMOVAL OF STONES  02/03/2022  Procedure: REMOVAL OF STONES;  Surgeon: Jeani Hawking, MD;  Location: [facility] ENDOSCOPY;  Service: Gastroenterology;;   REVISON OF ARTERIOVENOUS FISTULA Left 08/20/2022   Procedure: REVISON OF LEFT ARM ARTERIOVENOUS FISTULA;   Surgeon: Nada Libman, MD;  Location: [facility] OR;  Service: Vascular;  Laterality: Left;   RIGHT HEART CATH N/A 07/24/2017   Procedure: RIGHT HEART CATH;  Surgeon: Dolores Patty, MD;  Location: [facility] INVASIVE CV LAB;  Service: Cardiovascular;  Laterality: N/A;   RIGHT HEART CATHETERIZATION N/A 09/22/2013   Procedure: RIGHT HEART CATH;  Surgeon: Dolores Patty, MD;  Location: [facility] CATH LAB;  Service: Cardiovascular;  Laterality: N/A;   SHOULDER ARTHROSCOPY WITH OPEN ROTATOR CUFF REPAIR Right 03/14/2014   Procedure: RIGHT SHOULDER ARTHROSCOPY WITH BICEPS RELEASE, OPEN SUBSCAPULA REPAIR, OPEN SUPRASPINATUS REPAIR.;  Surgeon: Cammy Copa, MD;  Location[facility] OR;  Service: Orthopedics;  Laterality: Right;   SPHINCTEROTOMY  02/03/2022   Procedure: SPHINCTEROTOMY;  Surgeon: Jeani Hawking, MD;  Location: [facility] ENDOSCOPY;  Service: Gastroenterology;;   STUMP REVISION Right 06/13/2022   Procedure: REVISION RIGHT BELOW KNEE AMPUTATION;  Surgeon: Nadara Mustard, MD;  Location: [facility] OR;  Service: Orthopedics;  Laterality: Right;   TEE WITHOUT CARDIOVERSION N/A 02/04/2022   Procedure: TRANSESOPHAGEAL ECHOCARDIOGRAM (TEE);  Surgeon: Dolores Patty, MD;  Location: [facility] ENDOSCOPY;  Service: Cardiovascular;  Laterality: N/A;   THROMBECTOMY W/ EMBOLECTOMY Left 08/20/2022   Procedure: THROMBECTOMY OF LEFT ARM ARTERIOVENOUS FISTULA;  Surgeon: Nada Libman, MD;  Location: [facility] OR;  Service: Vascular;  Laterality: Left;   TOE AMPUTATION Right 02/24/2018   GREAT TOE AND 2ND TOE AMPUTATION   TUBAL LIGATION  1970's   Social History   Occupational History    Employer: UNEMPLOYED  Tobacco Use   Smoking status: Former    Packs/day: 3.00    Years: 32.00    Additional pack years: 0.00    Total pack years: 96.00    Types: Cigarettes    Quit date: 10/24/1997    Years since quitting: 24.9   Smokeless tobacco: Never  Vaping Use   Vaping Use: Never used  Substance and Sexual Activity   Alcohol use: Not Currently     Comment: "might have 2-3 daiquiris in the summer"   Drug use: No   Sexual activity: Not Currently    Birth control/protection: Surgical    Comment: Hysterectomy

## 2022-10-07 DIAGNOSIS — N2581 Secondary hyperparathyroidism of renal origin: Secondary | ICD-10-CM | POA: Diagnosis not present

## 2022-10-07 DIAGNOSIS — D689 Coagulation defect, unspecified: Secondary | ICD-10-CM | POA: Diagnosis not present

## 2022-10-07 DIAGNOSIS — Z992 Dependence on renal dialysis: Secondary | ICD-10-CM | POA: Diagnosis not present

## 2022-10-07 DIAGNOSIS — D631 Anemia in chronic kidney disease: Secondary | ICD-10-CM | POA: Diagnosis not present

## 2022-10-07 DIAGNOSIS — N186 End stage renal disease: Secondary | ICD-10-CM | POA: Diagnosis not present

## 2022-10-08 ENCOUNTER — Ambulatory Visit (HOSPITAL_COMMUNITY)
Admission: RE | Admit: 2022-10-08 | Discharge: 2022-10-08 | Disposition: A | Discharge status: Home / Self Care | Payer: PPO | Source: Ambulatory Visit | Attending: Family Medicine | Admitting: Family Medicine

## 2022-10-08 ENCOUNTER — Encounter (HOSPITAL_COMMUNITY): Payer: Self-pay

## 2022-10-08 VITALS — BP 108/56 | HR 76

## 2022-10-08 DIAGNOSIS — F32A Depression, unspecified: Secondary | ICD-10-CM | POA: Diagnosis not present

## 2022-10-08 DIAGNOSIS — E1122 Type 2 diabetes mellitus with diabetic chronic kidney disease: Secondary | ICD-10-CM | POA: Diagnosis not present

## 2022-10-08 DIAGNOSIS — E1110 Type 2 diabetes mellitus with ketoacidosis without coma: Secondary | ICD-10-CM | POA: Insufficient documentation

## 2022-10-08 DIAGNOSIS — I252 Old myocardial infarction: Secondary | ICD-10-CM | POA: Diagnosis not present

## 2022-10-08 DIAGNOSIS — Z87891 Personal history of nicotine dependence: Secondary | ICD-10-CM | POA: Insufficient documentation

## 2022-10-08 DIAGNOSIS — D631 Anemia in chronic kidney disease: Secondary | ICD-10-CM | POA: Diagnosis not present

## 2022-10-08 DIAGNOSIS — I132 Hypertensive heart and chronic kidney disease with heart failure and with stage 5 chronic kidney disease, or end stage renal disease: Secondary | ICD-10-CM | POA: Diagnosis not present

## 2022-10-08 DIAGNOSIS — A0472 Enterocolitis due to Clostridium difficile, not specified as recurrent: Secondary | ICD-10-CM | POA: Diagnosis not present

## 2022-10-08 DIAGNOSIS — Z79890 Hormone replacement therapy (postmenopausal): Secondary | ICD-10-CM | POA: Insufficient documentation

## 2022-10-08 DIAGNOSIS — E039 Hypothyroidism, unspecified: Secondary | ICD-10-CM | POA: Diagnosis not present

## 2022-10-08 DIAGNOSIS — I4510 Unspecified right bundle-branch block: Secondary | ICD-10-CM | POA: Insufficient documentation

## 2022-10-08 DIAGNOSIS — Z992 Dependence on renal dialysis: Secondary | ICD-10-CM | POA: Diagnosis not present

## 2022-10-08 DIAGNOSIS — I480 Paroxysmal atrial fibrillation: Secondary | ICD-10-CM | POA: Diagnosis not present

## 2022-10-08 DIAGNOSIS — N186 End stage renal disease: Secondary | ICD-10-CM | POA: Diagnosis not present

## 2022-10-08 DIAGNOSIS — F419 Anxiety disorder, unspecified: Secondary | ICD-10-CM | POA: Diagnosis not present

## 2022-10-08 DIAGNOSIS — Z955 Presence of coronary angioplasty implant and graft: Secondary | ICD-10-CM | POA: Insufficient documentation

## 2022-10-08 DIAGNOSIS — Z79899 Other long term (current) drug therapy: Secondary | ICD-10-CM | POA: Diagnosis not present

## 2022-10-08 DIAGNOSIS — I2510 Atherosclerotic heart disease of native coronary artery without angina pectoris: Secondary | ICD-10-CM | POA: Diagnosis not present

## 2022-10-08 DIAGNOSIS — Z7901 Long term (current) use of anticoagulants: Secondary | ICD-10-CM | POA: Insufficient documentation

## 2022-10-08 DIAGNOSIS — Z89511 Acquired absence of right leg below knee: Secondary | ICD-10-CM | POA: Diagnosis not present

## 2022-10-08 DIAGNOSIS — Z7984 Long term (current) use of oral hypoglycemic drugs: Secondary | ICD-10-CM | POA: Diagnosis not present

## 2022-10-08 DIAGNOSIS — Z5181 Encounter for therapeutic drug level monitoring: Secondary | ICD-10-CM | POA: Diagnosis not present

## 2022-10-08 DIAGNOSIS — I5082 Biventricular heart failure: Secondary | ICD-10-CM | POA: Diagnosis not present

## 2022-10-08 DIAGNOSIS — Z794 Long term (current) use of insulin: Secondary | ICD-10-CM | POA: Diagnosis not present

## 2022-10-08 DIAGNOSIS — Z8673 Personal history of transient ischemic attack (TIA), and cerebral infarction without residual deficits: Secondary | ICD-10-CM | POA: Insufficient documentation

## 2022-10-08 DIAGNOSIS — I5022 Chronic systolic (congestive) heart failure: Secondary | ICD-10-CM

## 2022-10-08 DIAGNOSIS — K851 Biliary acute pancreatitis without necrosis or infection: Secondary | ICD-10-CM | POA: Diagnosis not present

## 2022-10-08 LAB — HEPATIC FUNCTION PANEL
ALT: 16 U/L (ref 0–44)
AST: 16 U/L (ref 15–41)
Albumin: 2.7 g/dL — ABNORMAL LOW (ref 3.5–5.0)
Alkaline Phosphatase: 146 U/L — ABNORMAL HIGH (ref 38–126)
Bilirubin, Direct: <0.1 mg/dL (ref 0.0–0.2)
Total Bilirubin: 0.3 mg/dL (ref 0.3–1.2)
Total Protein: 5.9 g/dL — ABNORMAL LOW (ref 6.5–8.1)

## 2022-10-08 LAB — TSH: TSH: 5.58 u[IU]/mL — ABNORMAL HIGH (ref 0.350–4.500)

## 2022-10-08 NOTE — Progress Notes (Signed)
[facility] Note   PCP:  Donita Brooks, MD Nephrology: Dr Signe Colt HF Cardiologist: Arvilla Meres, MD   Reason for Visit:  Hospital follow-up  HPI:  Susan [name] is a 72 y.o. female with morbid obesity, RBBB, DM2, Hypothyroidism diastolic heart failure, CAD MI 2000/2007 BMS 2007, TIA, ESRD on HD and PAD.  Had home sleep study 07/25/20 and this was negative for OSA.   Admitted 10/22 with NSTEMI. Echo with decline in EF to 30-35% and new inferior WMA. Suspect progression in RCA disease.Managed medically due to CKD IV (Cr ~3.5).    Echo 7/23 showed EF 25-30%, severe LV dysfunction, grade II DD, RV mildly reduced.  Admitted 8/23 with DKA. Blood glucose > 1200. Ltd Echo showed EF 25-30%, RV moderately down. Hospitalization c/b AFib with RVR. Cards consulted and she was started on amio and Eliquis. PT rec SNF/CIR, she declined. She was discharged home, weight 244 lbs.  Admitted 9/23 with gallstone pancreatitis and CBD stone s/p extraction in addition to Enterococcus faecalis and E. Faecium bacteremia. TEE was negative for endocarditis, LVEF 30%, RV mod HK w/ severe TR. She was improving so planned for conservative management w/o surgery, per general surgery. AHF team consulted for help with volume management. GDMT limited due to low BP and CKD. Received IV abx per ID.  Discharged home, weight 235 lbs. Of note, C-Diff + after discharge, and started on po vanco qid by ID.  Patient started HD for ESRD in November 2023.  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 Readmitted later in the month with diarrhea and bloody bowel movements. She required 4 u RBCS. GI consulted and underwent oversewing of rectal ulcer. Readmitted 01/24 with constipation and stercoral colitis. Also positive for C.diff. During admission also needed R BKA revision and placement of VAC d/t wound dehiscence.  Admitted 4/23 with proctitis and found to have splenic infarct. Anticoagulation  restarted (had been stopped d/t GI bleeding). Later developed WCT concerning for VT versus atrial flutter with aberrancy. Cardiology consulted and stated on amio gtt. She had no further recurrence and was transitioned back to PO. not a candidate for ICD/device due to active infection and ESRD with HD line in place. She was discharged on 4/12.  She presents today for f/u for HF. Here w/ husband. In WC. Saw Dr. Lajoyce Corners yesterday and was noted to have an ulcer on her stump. Diabetes still under poor control but husband notes that home glucose readings remain high but steadily trending down. Compliant w/ iHD. Requires midodrine for BP support. Denies resting dyspnea. No CP. EKG today shows NSR. BP 108/56.    Cardiac Studies:  - TEE (9/23): EF 30%, LV moderately decreased function, RV moderately decreased function, no valvular vegetation, severe TR, moderate plaque in descending aorta.  - Echo (9/23): EF 30%, severe LV dysfunction, RV to mildly reduced, severe TR.  - Ltd Echo (8/23): EF 30-35%, moderately decreased LV, RV moderately reduced, mild to moderate TR  - Echo (7/23): EF 25-30%, severe LV dysfunction, grade II DD, RV mildly reduced.  - Echo (10/22): EF 30-35%  - Echo (9/21): EF 55-60%, RV ok  - Echo (10/19): EF 55-60%, Grade I DD  - RHC (3/19): showed mild PAH with evidence of RV strain likely due to OHS/OSA.   RA = 17 RV = 48/15 PA = 49/11 (30) PCW = 18 Fick cardiac output/index = 7.0/3.0 PVR = 0.5 WU Ao sat = 97% PA sat =  62%, 64%  1. Normal left-sided pressures 2. Mild PAH with evidence of RV strain likely due to OHS/OSA 3. Normal cardiac output  - Lexiscan w/ stress echo (2018): EF 68%, low risk  - LHC (2007): BMS distal RCA  - Cardiolite (03/2002): EF of 51% but no ischemia  - LHC (2000): BMS RCA   ROS: All systems reviewed and negative except as per HPI.   Allergies  Allergen Reactions   Cephalexin Diarrhea and Other (See Comments)   Codeine Nausea And Vomiting  and Other (See Comments)   Past Medical History:  Diagnosis Date   Acute MI (HCC) 1999; 2007; 03/05/21   Anemia    hx   Anginal pain (HCC)    Anxiety    ARF (acute renal failure) (HCC) 06/2017   [facility]   Arthritis    "generalized" (03/15/2014)   CAD (coronary artery disease)    MI in 2000 - MI  2007 - treated bare metal stent (no nuclear since then as 9/11)   Carotid artery disease (HCC)    CHF (congestive heart failure) (HCC)    HFrEF 03/06/21   Chronic diastolic heart failure (HCC)    a) ECHO (08/2013) EF 55-60% and RV function nl b) RHC (08/2013) RA 4, RV 30/5/7, PA 25/10 (16), PCWP 7, Fick CO/CI 6.3/2.7, PVR 1.5 WU, PA 61 and 66%   Daily headache    "~ every other day; since I fell in June" (03/15/2014)   Depression    Dyslipidemia    Dyspnea    uses oxygen qhs via [place]   ESRD (end stage renal disease) (HCC)    Dialysis on Tues Thurs Sat   Exertional shortness of breath    History of kidney stones    HTN (hypertension)    Hypothyroidism    Neuropathy    Obesity    Osteoarthritis    PAF (paroxysmal atrial fibrillation) (HCC)    Peripheral neuropathy    bilateral feet/hands   PONV (postoperative nausea and vomiting)    RBBB (right bundle branch block)    Old   Stroke (HCC)    mini strokes   Syncope    likely due to low blood sugar   Tachycardia    Sinus tachycardia   Type II diabetes mellitus (HCC)    Type II, [name]. patient has omnipod insulin pump with Novolin R Insulin   Urinary incontinence    Venous insufficiency    Family History  Problem Relation Age of Onset   Heart attack Mother 43   Past Surgical History:  Procedure Laterality Date   ABDOMINAL HYSTERECTOMY  1980's   AMPUTATION Right 02/24/2018   Procedure: RIGHT FOOT GREAT TOE AND 2ND TOE AMPUTATION;  Surgeon: Nadara Mustard, MD;  Location: [facility] OR;  Service: Orthopedics;  Laterality: Right;   AMPUTATION Right 04/30/2018   Procedure: RIGHT TRANSMETATARSAL  AMPUTATION;  Surgeon: Nadara Mustard, MD;  Location: [facility] OR;  Service: Orthopedics;  Laterality: Right;   AMPUTATION Right 05/02/2022   Procedure: RIGHT BELOW KNEE AMPUTATION;  Surgeon: Nadara Mustard, MD;  Location: [facility] OR;  Service: Orthopedics;  Laterality: Right;   APPLICATION OF WOUND VAC Right 06/13/2022   Procedure: APPLICATION OF WOUND VAC;  Surgeon: Nadara Mustard, MD;  Location: [facility] OR;  Service: Orthopedics;  Laterality: Right;   AV FISTULA PLACEMENT Left 04/02/2022   Procedure: LEFT ARM ARTERIOVENOUS (AV) FISTULA CREATION;  Surgeon: Nada Libman, MD;  Location: [facility] OR;  Service: Vascular;  Laterality: Left;  PERIPHERAL NERVE BLOCK   BASCILIC VEIN TRANSPOSITION Left 07/31/2022   Procedure: LEFT ARM SECOND STAGE BASILIC VEIN TRANSPOSITION;  Surgeon: Nada Libman, MD;  [facility]: [facility];  Service: Vascular;  Laterality: Left;   BIOPSY  05/27/2020   Procedure: BIOPSY;  Surgeon: Lanelle Bal, DO;  [facility];  Service: Endoscopy;;   CATARACT EXTRACTION, BILATERAL Bilateral ?2013   COLONOSCOPY W/ POLYPECTOMY     COLONOSCOPY WITH PROPOFOL N/A 03/13/2019   Procedure: COLONOSCOPY WITH PROPOFOL;  Surgeon: Beverley Fiedler, MD;  Location: [facility] ENDOSCOPY;  Service: Gastroenterology;  Laterality: N/A;   CORONARY ANGIOPLASTY WITH STENT PLACEMENT  1999; 2007   "1 + 1"   ERCP N/A 02/03/2022   Procedure: ENDOSCOPIC RETROGRADE CHOLANGIOPANCREATOGRAPHY (ERCP);  Surgeon: Jeani Hawking, MD;  Location: [facility] ENDOSCOPY;  Service: Gastroenterology;  Laterality: N/A;   ESOPHAGOGASTRODUODENOSCOPY (EGD) WITH PROPOFOL N/A 03/13/2019   Procedure: ESOPHAGOGASTRODUODENOSCOPY (EGD) WITH PROPOFOL;  Surgeon: Beverley Fiedler, MD;  Location: [facility] ENDOSCOPY;  Service: Gastroenterology;  Laterality: N/A;   ESOPHAGOGASTRODUODENOSCOPY (EGD) WITH PROPOFOL N/A 05/27/2020   Procedure: ESOPHAGOGASTRODUODENOSCOPY (EGD) WITH PROPOFOL;  Surgeon: Lanelle Bal, DO;  [facility];  Service: Endoscopy;  Laterality:  N/A;   ESOPHAGOGASTRODUODENOSCOPY (EGD) WITH PROPOFOL N/A 09/03/2022   Procedure: ESOPHAGOGASTRODUODENOSCOPY (EGD) WITH PROPOFOL;  Surgeon: Jeani Hawking, MD;  Location: [facility] ENDOSCOPY;  Service: Gastroenterology;  Laterality: N/A;   EYE SURGERY Bilateral    lazer   FLEXIBLE SIGMOIDOSCOPY N/A 05/23/2022   Procedure: FLEXIBLE SIGMOIDOSCOPY;  Surgeon: Jeani Hawking, MD;  Location: [facility] ENDOSCOPY;  Service: Gastroenterology;  Laterality: N/A;   FLEXIBLE SIGMOIDOSCOPY N/A 05/24/2022   Procedure: FLEXIBLE SIGMOIDOSCOPY;  Surgeon: Imogene Burn, MD;  Location: [facility] ENDOSCOPY;  Service: Gastroenterology;  Laterality: N/A;   FLEXIBLE SIGMOIDOSCOPY N/A 09/03/2022   Procedure: FLEXIBLE SIGMOIDOSCOPY;  Surgeon: Jeani Hawking, MD;  Location: [facility] ENDOSCOPY;  Service: Gastroenterology;  Laterality: N/A;   HEMOSTASIS CLIP PLACEMENT  03/13/2019   Procedure: HEMOSTASIS CLIP PLACEMENT;  Surgeon: Beverley Fiedler, MD;  Location: [facility] ENDOSCOPY;  Service: Gastroenterology;;   HEMOSTASIS CLIP PLACEMENT  05/23/2022   Procedure: HEMOSTASIS CLIP PLACEMENT;  Surgeon: Jeani Hawking, MD;  Location: [facility] ENDOSCOPY;  Service: Gastroenterology;;   HEMOSTASIS CONTROL  05/24/2022   Procedure: HEMOSTASIS CONTROL;  Surgeon: Imogene Burn, MD;  Location: [facility] ENDOSCOPY;  Service: Gastroenterology;;   HOT HEMOSTASIS N/A 05/23/2022   Procedure: HOT HEMOSTASIS (ARGON PLASMA COAGULATION/BICAP);  Surgeon: Jeani Hawking, MD;  Location: [facility] ENDOSCOPY;  Service: Gastroenterology;  Laterality: N/A;   I & D EXTREMITY Left 05/05/2022   Procedure: IRRIGATION AND DEBRIDEMENT LEFT ARM AV FISTULA;  Surgeon: Cephus Shelling, MD;  [facility]: [facility] OR;  Service: Vascular;  Laterality: Left;   INSERTION OF DIALYSIS CATHETER Right 04/02/2022   Procedure: INSERTION OF TUNNELED DIALYSIS CATHETER;  Surgeon: Nada Libman, MD;  Location: [facility] OR;  Service: Vascular;  Laterality: Right;   KNEE ARTHROSCOPY Left 10/25/2006   POLYPECTOMY  03/13/2019   Procedure:  POLYPECTOMY;  Surgeon: Beverley Fiedler, MD;  Location: [facility] ENDOSCOPY;  Service: Gastroenterology;;   REMOVAL OF STONES  02/03/2022   Procedure: REMOVAL OF STONES;  Surgeon: Jeani Hawking, MD;  Location: [facility] ENDOSCOPY;  Service: Gastroenterology;;   REVISON OF ARTERIOVENOUS FISTULA Left 08/20/2022   Procedure: REVISON OF LEFT ARM ARTERIOVENOUS FISTULA;  Surgeon: Nada Libman, MD;  Location[facility] OR;  Service: Vascular;  Laterality: Left;   RIGHT HEART CATH N/A 07/24/2017   Procedure: RIGHT  HEART CATH;  Surgeon: Dolores Patty, MD;  Location: [facility] INVASIVE CV LAB;  Service: Cardiovascular;  Laterality: N/A;   RIGHT HEART CATHETERIZATION N/A 09/22/2013   Procedure: RIGHT HEART CATH;  Surgeon: Dolores Patty, MD;  Location: [facility] CATH LAB;  Service: Cardiovascular;  Laterality: N/A;   SHOULDER ARTHROSCOPY WITH OPEN ROTATOR CUFF REPAIR Right 03/14/2014   Procedure: RIGHT SHOULDER ARTHROSCOPY WITH BICEPS RELEASE, OPEN SUBSCAPULA REPAIR, OPEN SUPRASPINATUS REPAIR.;  Surgeon: Cammy Copa, MD;  Location: [facility] OR;  Service: Orthopedics;  Laterality: Right;   SPHINCTEROTOMY  02/03/2022   Procedure: SPHINCTEROTOMY;  Surgeon: Jeani Hawking, MD;  Location: [facility] ENDOSCOPY;  Service: Gastroenterology;;   STUMP REVISION Right 06/13/2022   Procedure: REVISION RIGHT BELOW KNEE AMPUTATION;  Surgeon: Nadara Mustard, MD;  Location: [facility] OR;  Service: Orthopedics;  Laterality: Right;   TEE WITHOUT CARDIOVERSION N/A 02/04/2022   Procedure: TRANSESOPHAGEAL ECHOCARDIOGRAM (TEE);  Surgeon: Dolores Patty, MD;  Location: [facility] ENDOSCOPY;  Service: Cardiovascular;  Laterality: N/A;   THROMBECTOMY W/ EMBOLECTOMY Left 08/20/2022   Procedure: THROMBECTOMY OF LEFT ARM ARTERIOVENOUS FISTULA;  Surgeon: Nada Libman, MD;  Location: [facility] OR;  Service: Vascular;  Laterality: Left;   TOE AMPUTATION Right 02/24/2018   GREAT TOE AND 2ND TOE AMPUTATION   TUBAL LIGATION  1970's   Social History   Socioeconomic History   Marital  status: Married    Spouse name: Ramon Dredge   Number of children: 3   Years of education: 12th   Highest education level: Not on file  Occupational History    Employer: UNEMPLOYED  Tobacco Use   Smoking status: Former    Packs/day: 3.00    Years: 32.00    Additional pack years: 0.00    Total pack years: 96.00    Types: Cigarettes    Quit date: 10/24/1997    Years since quitting: 24.9   Smokeless tobacco: Never  Vaping Use   Vaping Use: Never used  Substance and Sexual Activity   Alcohol use: Not Currently    Comment: "might have 2-3 daiquiris in the summer"   Drug use: No   Sexual activity: Not Currently    Birth control/protection: Surgical    Comment: Hysterectomy  Other Topics Concern   Not on file  Social History Narrative   Pt lives at home with her spouse.Caffeine Use- 3 sodas daily.  Update:  now resides [facility]   Social Determinants of Health   Financial Resource Strain: Low Risk  (08/16/2021)   Overall Financial Resource Strain (CARDIA)    Difficulty of Paying Living Expenses: Not hard at all  Food Insecurity: No Food Insecurity (08/27/2022)   Hunger Vital Sign    Worried About Running Out of Food in the Last Year: Never true    Ran Out of Food in the Last Year: Never true  Transportation Needs: No Transportation Needs (08/27/2022)   PRAPARE - Administrator, Civil Service (Medical): No    Lack of Transportation (Non-Medical): No  Physical Activity: Inactive (08/16/2021)   Exercise Vital Sign    Days of Exercise per Week: 0 days    Minutes of Exercise per Session: 0 min  Stress: No Stress Concern Present (08/16/2021)   [employer] of Occupational Health - Occupational Stress Questionnaire    Feeling of Stress : Not at all  Social Connections: Moderately Isolated (08/16/2021)   Social Connection and Isolation Panel [NHANES]    Frequency of Communication with Friends and Family: More than  three times a week    Frequency of Social Gatherings with  Friends and Family: More than three times a week    Attends Religious Services: Never    Database administrator or Organizations: No    Attends Banker Meetings: Never    Marital Status: Married  Catering manager Violence: Not At Risk (08/27/2022)   Humiliation, Afraid, Rape, and Kick questionnaire    Fear of Current or Ex-Partner: No    Emotionally Abused: No    Physically Abused: No    Sexually Abused: No   Lipid Panel     Component Value Date/Time   CHOL 92 02/01/2022 0615   TRIG 143 02/01/2022 0615   HDL 34 (L) 02/01/2022 0615   CHOLHDL 2.7 02/01/2022 0615   VLDL 29 02/01/2022 0615   LDLCALC 29 02/01/2022 0615   LDLCALC 65 01/17/2019 1608   Current Outpatient Medications on File Prior to Encounter  Medication Sig Dispense Refill   albuterol (VENTOLIN HFA) 108 (90 Base) MCG/ACT inhaler Inhale 1-2 puffs into the lungs every 6 (six) hours as needed for wheezing or shortness of breath.     amiodarone (PACERONE) 200 MG tablet Take 200 mg by mouth 2 (two) times daily.     apixaban (ELIQUIS) 5 MG TABS tablet Take 1 tablet (5 mg total) by mouth 2 (two) times daily. 60 tablet    atorvastatin (LIPITOR) 80 MG tablet Take 1 tablet (80 mg total) by mouth daily.     blood glucose meter kit and supplies Use up to four times daily as directed. 1 each 0   Continuous Blood Gluc Sensor (FREESTYLE LIBRE 2 SENSOR) MISC      ferrous sulfate 325 (65 FE) MG tablet Take 325 mg by mouth 2 (two) times daily. (1000 & 2100)     Fingerstix Lancets MISC Use as directed to check blood sugars as need up to 4 times daily 100 each 0   folic acid (FOLVITE) 1 MG tablet Take 1 mg by mouth daily.     hydrocortisone (ANUSOL-HC) 2.5 % rectal cream Place 1 Application rectally 2 (two) times daily. (1000 & 2100)     insulin aspart (NOVOLOG FLEXPEN) 100 UNIT/ML FlexPen Inject 0-15 Units into the skin See admin instructions. Inject 12 units subcutaneously 3 times daily with meals (0900, 1300 & 1700) & as needed  via sliding scale insulin CBG <70=Notify NP/MD 71-149=0 units,150-199=4 units, 200-249=6 units,250- 299=10 units, 300-349=12 units, 350-399=15 units, > 400 call MD ( prime pen with 2 units prior  to set dose) 15 mL 0   insulin glargine (LANTUS SOLOSTAR) 100 UNIT/ML Solostar Pen Inject 16 Units into the skin 2 (two) times daily. 15 mL 0   Insulin Pen Needle 32G X 4 MM MISC Use as directed 200 each 0   levothyroxine (SYNTHROID) 75 MCG tablet Take 75 mcg by mouth daily before breakfast.     linagliptin (TRADJENTA) 5 MG TABS tablet Take 1 tablet (5 mg total) by mouth daily. 30 tablet 0   LORazepam (ATIVAN) 0.5 MG tablet Take 1 tablet (0.5 mg total) by mouth at bedtime. 30 tablet 1   metoCLOPramide (REGLAN) 10 MG tablet Take 1 tablet (10 mg total) by mouth every 6 (six) hours as needed for nausea or vomiting. 30 tablet 0   midodrine (PROAMATINE) 10 MG tablet Take 10 mg by mouth every Monday, Wednesday, and Friday with hemodialysis.     midodrine (PROAMATINE) 5 MG tablet Take 5 mg by mouth See admin  instructions. Take 1 tablet (5 mg) by mouth with meals on Tuesdays, Thursdays, Saturdays & Sundays.     Nutritional Supplements (FEEDING SUPPLEMENT, NEPRO CARB STEADY,) LIQD Take 237 mLs by mouth daily. (0900)     nystatin (MYCOSTATIN) 100000 UNIT/ML suspension Use as directed 5 mLs (500,000 Units total) in the mouth or throat 4 (four) times daily. 60 mL 0   ondansetron (ZOFRAN) 4 MG tablet Take 4 mg by mouth every 8 (eight) hours as needed for nausea.     oxyCODONE-acetaminophen (PERCOCET/ROXICET) 5-325 MG tablet Take 1 tablet by mouth every 6 (six) hours as needed. 20 tablet 0   OXYGEN Inhale 2 L/min into the lungs at bedtime.     pantoprazole (PROTONIX) 40 MG tablet Take 1 tablet (40 mg total) by mouth 2 (two) times daily before a meal. 30 tablet 0   polyethylene glycol (MIRALAX / GLYCOLAX) 17 g packet Take 17 g by mouth 2 (two) times daily. (Patient taking differently: Take 17 g by mouth daily as needed  (constipation.).) 14 each 0   pregabalin (LYRICA) 75 MG capsule Take 1 capsule (75 mg total) by mouth daily. 2 capsule 0   senna-docusate (SENOKOT-S) 8.6-50 MG tablet Take 2 tablets by mouth 2 (two) times daily.     sevelamer carbonate (RENVELA) 800 MG tablet Take 1,600 mg by mouth 3 (three) times daily with meals. (0800, 1200 & 1700)     vortioxetine HBr (TRINTELLIX) 20 MG TABS tablet Take 1 tablet (20 mg total) by mouth in the morning. (1000) 30 tablet 5   diphenoxylate-atropine (LOMOTIL) 2.5-0.025 MG tablet Take 1 tablet by mouth 4 (four) times daily as needed for diarrhea or loose stools. (Patient not taking: Reported on 10/08/2022) 30 tablet 0   No current facility-administered medications on file prior to encounter.   Wt Readings from Last 3 Encounters:  09/29/22 93 kg (205 lb)  09/08/22 93 kg (205 lb 0.4 oz)  09/05/22 93.4 kg (205 lb 14.6 oz)   BP (!) 108/56   Pulse 76   SpO2 97%   PHYSICAL EXAM: General: chronically ill appearing. No respiratory difficulty HEENT: normal Neck: supple. no JVD. Carotids 2+ bilat; no bruits. No lymphadenopathy or thyromegaly appreciated. Cor: PMI nondisplaced. Regular rate & rhythm. No rubs, gallops or murmurs. Lungs: clear Abdomen: soft, nontender, nondistended. No hepatosplenomegaly. No bruits or masses. Good bowel sounds. Extremities: no cyanosis, clubbing, rash, s/p rt BKA  Neuro: alert & oriented x 3, cranial nerves grossly intact. moves all 4 extremities w/o difficulty. Affect pleasant.   ECG (personally reviewed): NSR, RBBB 77 bpm   ASSESSMENT AND PLAN:  1. Chronic Biventricular CHF - Echo (9/21): EF 55-60%, RV normal  - Echo (10/22): EF down to 30-35% in setting of NSTEMI, Grade II DD. - Echo (7/23): EF 25-30%, RV mildly reduced. - Ltd Echo (8/23): EF 30-35%, RV moderately reduced - Echo (9/23): EF 30%, interventricular septum flattened in diastole consistent with RV volume overload, RV low normal, severe TR - Echo 4/24 EF 30-35%, RV  mildly reduced, moderate TR  - unable to ascertain NYHA Functional Class as pt currently WC bound, s/p BKA, awaiting stump to heal before getting a prosthesis. Denies resting dyspnea - Volume now controlled by iHD, volume stable on exam today   - GDMT limited by CKD and hypotension requiring midodrine  - labs followed at HD    2. CAD  - Known CAD w/ h/o remote MI in 2000 s/p BMS to RCA and again in 2007 w/  BMS to RCA  - Admitted 10/22 NSTEM (hstrop 247) Echo with decline in EF to 30-35% and new inferior WMA. Suspect progression in RCA disease. No cath due CKD IV - Rare angina (stable).  - Not candidate for cath with CKD IV unless ACS or persistent, severe angina. - Continue atorvastatin 80 mg daily. - Continue ASA. Now off Plavix due to need for [facility] - off ? blocker and Imdur due to hypotension    3. h/o Gallstone pancreatitis - Lipase 1341>112>60 - s/p ERCP on 02/03/22 with biliary sphincterotomy and balloon extraction. - No evidence of cholecystitis on imaging.    4. h/o Enterococcus faecalis and E faecium bacteremia - Repeat BC X 2 09/11 NGTD - GI source suspected. - No evidence of endocarditis on TEE - ID following and she is now off linezolid.   5 . Paroxysmal AF/AFL  - Currently NSR on EKG  - Continue amiodarone 200 mg daily. Check TSH and HFTs today  - Continue Eliquis 5 mg bid. Will need to watch for recurrent GIB (denies any issues)     6. ESRD - on HD T,TH,Sat  - suspect diabetic nephropathy   7. Uncontrolled DM II w/ Complications  - Hgb A1c 15 in 08/23 - on insulin  - followed by PCP and Endocrinology  - No SGLT2i given poor control.   8. Anemia of CKD - labs followed at HD    9. H/o C-Difficile infection - she reports completing vancomycin  10. S/p Rt BKA - complication of poorly controlled DM  - followed by Dr. Lajoyce Corners   F/u w/ Dr. Gala Romney in 4-6 months    Robbie Lis, PA-C  10/08/22

## 2022-10-08 NOTE — Patient Instructions (Signed)
Medication Changes:  We recommend that you continue on your current medications as directed. Please refer to the Current Medication list given to you today.   *If you need a refill on your cardiac medications before your next appointment, please call your pharmacy*  Lab Work:  Labs done today, your results will be available in MyChart, we will contact you for abnormal readings.   Follow-Up in:   Your physician recommends that you schedule a follow-up appointment in: 4 months with Dr. Gala Romney   Do the following things EVERYDAY: Weigh yourself in the morning before breakfast. Write it down and keep it in a log. Take your medicines as prescribed Eat low salt foods--Limit salt (sodium) to 2000 mg per day.  Stay as active as you can everyday Limit all fluids for the day to less than 2 liters    Need to Contact us:  If you have any questions or concerns before your next appointment please send [name] a message through [place] or call our office at [phone].    TO LEAVE A MESSAGE FOR THE NURSE SELECT OPTION 2, PLEASE LEAVE A MESSAGE INCLUDING: YOUR NAME DATE OF BIRTH CALL BACK NUMBER REASON FOR CALL**this is important as we prioritize the call backs  YOU WILL RECEIVE A CALL BACK THE SAME DAY AS LONG AS YOU CALL BEFORE 4:00 PM   At the [facility], you and your health needs are our priority. As part of our continuing mission to provide you with exceptional heart care, we have created designated Provider Care Teams. These Care Teams include your primary Cardiologist (physician) and Advanced Practice Providers (APPs- Physician Assistants and Nurse Practitioners) who all work together to provide you with the care you need, when you need it.   You may see any of the following providers on your designated Care Team at your next follow up: Dr Arvilla Meres Dr Marca Ancona Dr. Marcos Eke, NP Robbie Lis, [place] [facility] [place],  [place] Brynda Peon, NP Karle Plumber, PharmD   Please be sure to bring in all your medications bottles to every appointment.    Thank you for choosing [facility]

## 2022-10-09 NOTE — Telephone Encounter (Signed)
Received fax from Dr Arrie Aran requesting fistulogram due to inability to cannulate. Will give to [facility].

## 2022-10-10 ENCOUNTER — Other Ambulatory Visit: Payer: Self-pay

## 2022-10-10 ENCOUNTER — Telehealth: Payer: Self-pay

## 2022-10-10 ENCOUNTER — Other Ambulatory Visit (HOSPITAL_COMMUNITY): Payer: Self-pay | Admitting: Internal Medicine

## 2022-10-10 ENCOUNTER — Other Ambulatory Visit: Payer: Self-pay | Admitting: Family Medicine

## 2022-10-10 DIAGNOSIS — E1165 Type 2 diabetes mellitus with hyperglycemia: Secondary | ICD-10-CM

## 2022-10-10 DIAGNOSIS — E119 Type 2 diabetes mellitus without complications: Secondary | ICD-10-CM

## 2022-10-10 MED ORDER — NYSTATIN 100000 UNIT/ML MT SUSP
5.0000 mL | Freq: Four times a day (QID) | OROMUCOSAL | 2 refills | Status: DC
Start: 2022-10-10 — End: 2022-12-12

## 2022-10-10 NOTE — Telephone Encounter (Signed)
Requested Prescriptions  Refused Prescriptions Disp Refills   magnesium oxide (MAG-OX) 400 (240 Mg) MG tablet [Pharmacy Med Name: MAGNESIUM OXIDE 400 MG TABLET] 90 tablet 3    Sig: TAKE 1 TABLET BY MOUTH EVERY DAY     Endocrinology:  Minerals - Magnesium Supplementation Failed - 10/10/2022 11:45 AM      Failed - Cr in normal range and within 360 days    Creat  Date Value Ref Range Status  03/26/2022 6.22 (H) 0.60 - 1.00 mg/dL Final   Creatinine, Ser  Date Value Ref Range Status  09/08/2022 4.48 (H) 0.44 - 1.00 mg/dL Final   Creatinine, Urine  Date Value Ref Range Status  08/14/2020 35.12 mg/dL Final    Comment:    Performed at [facility], [address] [place], [place] [address]         Failed - Valid encounter within last 12 months    Recent Outpatient Visits           1 year ago Cellulitis of lower extremity, unspecified laterality   [facility] [name], Priscille Heidelberg, MD   1 year ago Diarrhea, unspecified type   [facility] [name], Priscille Heidelberg, MD   1 year ago Dysuria   [facility] Tanya Nones, Priscille Heidelberg, MD   1 year ago Stage 4 chronic kidney disease (HCC)   [facility] Donita Brooks, MD   1 year ago Chronic diastolic CHF (congestive heart failure) (HCC)   [facility] [name], Priscille Heidelberg, MD       Future Appointments             In 3 days Nadara Mustard, MD [facility]            Passed - Mg Level in normal range and within 360 days    Magnesium  Date Value Ref Range Status  09/04/2022 1.9 1.7 - 2.4 mg/dL Final    Comment:    Performed at [facility], [address] [place], [place] [address]

## 2022-10-10 NOTE — Telephone Encounter (Signed)
Pt called in asking if pcp would refill this med: Prescription Request  10/10/2022  LOV: 09/29/22  What is the name of the medication or equipment? nystatin (MYCOSTATIN) 100000 UNIT/ML suspension [[id number]]  Have you contacted your pharmacy to request a refill? No   Which pharmacy would you like this sent to?  [id number] [place], [place] - [address] [place] [address] Phone: [phone] Fax: [phone]    Patient notified that their request is being sent to the clinical staff for review and that they should receive a response within 2 business days.   Please advise at [facility] [phone]

## 2022-10-13 ENCOUNTER — Encounter: Payer: Self-pay | Admitting: Orthopedic Surgery

## 2022-10-13 ENCOUNTER — Ambulatory Visit (INDEPENDENT_AMBULATORY_CARE_PROVIDER_SITE_OTHER): Payer: PPO | Admitting: Orthopedic Surgery

## 2022-10-13 DIAGNOSIS — Z89511 Acquired absence of right leg below knee: Secondary | ICD-10-CM | POA: Diagnosis not present

## 2022-10-13 DIAGNOSIS — I87332 Chronic venous hypertension (idiopathic) with ulcer and inflammation of left lower extremity: Secondary | ICD-10-CM | POA: Diagnosis not present

## 2022-10-13 NOTE — Progress Notes (Signed)
Office Visit Note   Patient: Susan [name]           Date of Birth: 01-May-1951           MRN: [id number] Visit Date: 10/13/2022              Requested by: Donita Brooks, MD [address] [place],  [place] [address] PCP: Donita Brooks, MD  Chief Complaint  Patient presents with   Right Leg - Follow-up    06/13/2022 right BKA    Left Leg - Follow-up      HPI: Patient is a 72 year old woman with venous stasis ulcers on the left leg right transtibial amputation.  Patient states she has difficulty trying to balance on her left leg.  Assessment & Plan: Visit Diagnoses:  1. Right below-knee amputee (HCC)   2. 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  Patient has to follow-up with [facility] for prosthetic fitting.  Follow-up in 1 week to evaluate the venous ulcer.  Follow-Up Instructions: Return in about 1 week (around 10/20/2022).   Ortho Exam  Patient is alert, oriented, no adenopathy, well-dressed, normal affect, normal respiratory effort. Examination patient has a well-healed residual limb on the right.  Left leg there is a venous ulcer that is 2 x 2 cm with a scab covering it there is healthy granulation tissue around the wound edges.  Imaging: No results found. No images are attached to the encounter.  Labs: Lab Results  Component Value Date   HGBA1C 7.9 (H) 08/27/2022   HGBA1C 8.9 (H) 05/02/2022   HGBA1C >15.5 (H) 01/20/2022   CRP 0.6 09/01/2022   CRP 0.7 08/22/2020   CRP 1.5 (H) 03/13/2020   LABURIC 4.7 08/09/2020   LABURIC 9.1 (H) 10/12/2018   LABURIC 9.0 (H) 03/06/2018   REPTSTATUS 08/30/2022 FINAL 08/29/2022   GRAMSTAIN  05/05/2022    FEW WBC PRESENT, PREDOMINANTLY PMN NO ORGANISMS SEEN    CULT MULTIPLE SPECIES PRESENT, SUGGEST RECOLLECTION (A) 08/29/2022   LABORGA SERRATIA MARCESCENS 05/05/2022     Lab Results  Component Value Date   ALBUMIN  2.7 (L) 10/08/2022   ALBUMIN 2.9 (L) 09/08/2022   ALBUMIN 2.2 (L) 09/05/2022   PREALBUMIN 13 (L) 05/02/2022    Lab Results  Component Value Date   MG 1.9 09/04/2022   MG 1.8 09/03/2022   MG 1.6 (L) 09/02/2022   Lab Results  Component Value Date   VD25OH 42.14 05/02/2022    Lab Results  Component Value Date   PREALBUMIN 13 (L) 05/02/2022      Latest Ref Rng & Units 09/08/2022    9:46 AM 09/05/2022   12:51 PM 09/05/2022    6:00 AM  CBC EXTENDED  WBC 4.0 - 10.5 K/uL 5.2  4.5  5.1   RBC 3.87 - 5.11 MIL/uL 4.79  3.89  4.14   Hemoglobin 12.0 - 15.0 g/dL 19.1  9.1  9.8   HCT 47.8 - 46.0 % 38.4  31.2  32.7   Platelets 150 - 400 K/uL 228  176  184   NEUT# 1.7 - 7.7 K/uL 4.6     Lymph# 0.7 - 4.0 K/uL 0.3        There is no height or weight on file to calculate BMI.  Orders:  No orders of the defined types were placed in this encounter.  No orders  of the defined types were placed in this encounter.    Procedures: No procedures performed  Clinical Data: No additional findings.  ROS:  All other systems negative, except as noted in the HPI. Review of Systems  Objective: Vital Signs: There were no vitals taken for this visit.  Specialty Comments:  No specialty comments available.  PMFS History: Patient Active Problem List   Diagnosis Date Noted   Secondary hypercoagulable state (HCC) 09/04/2022   Right sided abdominal pain 08/31/2022   Constipation 06/07/2022   History of Clostridioides difficile colitis 06/06/2022   Below-knee amputation of right lower extremity (HCC) 06/06/2022   Diverticulitis 06/05/2022   Stercoral colitis 06/05/2022   C. 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) 04/07/2022   Allergy, unspecified, initial encounter 04/07/2022   Dependence on renal dialysis (HCC) 04/07/2022   Gout due to renal impairment, unspecified site 04/07/2022   Hypertensive heart and chronic kidney disease with heart failure and with stage 5 chronic kidney disease, or end stage renal disease (HCC) 04/07/2022   Personal history of transient ischemic attack (TIA), and cerebral infarction without residual deficits 04/07/2022   Renal osteodystrophy 04/07/2022   Venous stasis ulcer of right calf (HCC) 03/31/2022   Fistula, colovaginal 03/26/2022   Diarrhea 03/26/2022   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   Sepsis without acute organ dysfunction (HCC)    Bacteremia    Acute pancreatitis 02/01/2022   Abdominal pain 02/01/2022   SIRS (systemic inflammatory response syndrome) (HCC) 02/01/2022   Transaminitis 02/01/2022   History of anemia due to chronic kidney disease 02/01/2022   Paroxysmal atrial fibrillation (HCC) 02/01/2022   DKA (diabetic ketoacidosis) (HCC) 01/14/2022   NSTEMI (non-ST elevated myocardial infarction) (HCC) 03/05/2021   Acute renal failure superimposed on stage 4 chronic kidney disease (HCC) 08/22/2020   Hypoalbuminemia 05/25/2020   GERD (gastroesophageal reflux disease) 05/25/2020   Pressure injury of skin 05/17/2020   Acute on chronic combined systolic and diastolic congestive heart failure (HCC) 03/07/2020   Type 2 diabetes mellitus with diabetic polyneuropathy, with long-term current use of insulin (HCC) 03/07/2020   Obesity, Class III, BMI 40-49.9 (morbid obesity) (HCC) 03/07/2020   Common bile duct (CBD) obstruction 05/28/2019   Benign neoplasm of ascending colon    Benign neoplasm  of transverse colon    Benign neoplasm of descending colon    Benign neoplasm of sigmoid colon    Gastric polyps    Hyperkalemia 03/11/2019   Prolonged QT interval 03/11/2019   Acute blood loss anemia 03/11/2019   Onychomycosis 06/21/2018   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)    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 (HCC)    PVD (peripheral vascular disease) (HCC) 10/26/2017   E-coli UTI 07/27/2017   Hypothyroidism 07/27/2017   AKI (acute kidney injury) (HCC)    PAH (pulmonary artery hypertension) (HCC)    Impaired ambulation 07/19/2017   Nausea & vomiting 07/15/2017   Leg cramps 02/27/2017   Peripheral edema 01/12/2017  Diabetic neuropathy (HCC) 11/12/2016   CKD (chronic kidney disease), stage IV (HCC) 10/24/2015   Anemia of chronic disease 10/03/2015   Generalized anxiety disorder 10/03/2015   Insomnia 10/03/2015   Hyperglycemia due to diabetes mellitus (HCC) 06/07/2015   Chronic diastolic CHF (congestive heart failure) (HCC) 06/07/2015   Non compliance with medical treatment 04/17/2014   Rotator cuff tear 03/14/2014   Class 3 obesity (HCC) 09/23/2013   Chronic HFrEF (heart failure with reduced ejection fraction) (HCC) 06/03/2013   Hypotension 12/25/2012   Urinary incontinence    MDD (major depressive disorder) 11/12/2010   RBBB (right bundle branch block)    Wide-complex tachycardia    Coronary artery disease    Hyperlipemia 01/22/2009   Essential hypertension 01/22/2009   Past Medical History:  Diagnosis Date   Acute MI (HCC) 1999; 2007; 03/05/21   Anemia    hx   Anginal pain (HCC)    Anxiety    ARF (acute renal failure) (HCC) 06/2017   [facility]   Arthritis    "generalized" (03/15/2014)   CAD (coronary artery disease)    MI in 2000 - MI  2007 - treated bare metal stent (no nuclear since then as 9/11)   Carotid artery disease (HCC)    CHF (congestive heart failure) (HCC)    HFrEF 03/06/21   Chronic diastolic heart  failure (HCC)    a) ECHO (08/2013) EF 55-60% and RV function nl b) RHC (08/2013) RA 4, RV 30/5/7, PA 25/10 (16), PCWP 7, Fick CO/CI 6.3/2.7, PVR 1.5 WU, PA 61 and 66%   Daily headache    "~ every other day; since I fell in June" (03/15/2014)   Depression    Dyslipidemia    Dyspnea    uses oxygen qhs via [place]   ESRD (end stage renal disease) (HCC)    Dialysis on Tues Thurs Sat   Exertional shortness of breath    History of kidney stones    HTN (hypertension)    Hypothyroidism    Neuropathy    Obesity    Osteoarthritis    PAF (paroxysmal atrial fibrillation) (HCC)    Peripheral neuropathy    bilateral feet/hands   PONV (postoperative nausea and vomiting)    RBBB (right bundle branch block)    Old   Stroke (HCC)    mini strokes   Syncope    likely due to low blood sugar   Tachycardia    Sinus tachycardia   Type II diabetes mellitus (HCC)    Type II, [name]. patient has omnipod insulin pump with Novolin R Insulin   Urinary incontinence    Venous insufficiency     Family History  Problem Relation Age of Onset   Heart attack Mother 44    Past Surgical History:  Procedure Laterality Date   ABDOMINAL HYSTERECTOMY  1980's   AMPUTATION Right 02/24/2018   Procedure: RIGHT FOOT GREAT TOE AND 2ND TOE AMPUTATION;  Surgeon: Nadara Mustard, MD;  Location: [facility] OR;  Service: Orthopedics;  Laterality: Right;   AMPUTATION Right 04/30/2018   Procedure: RIGHT TRANSMETATARSAL AMPUTATION;  Surgeon: Nadara Mustard, MD;  Location[facility] OR;  Service: Orthopedics;  Laterality: Right;   AMPUTATION Right 05/02/2022   Procedure: RIGHT BELOW KNEE AMPUTATION;  Surgeon: Nadara Mustard, MD;  Location: [facility] OR;  Service: Orthopedics;  Laterality: Right;   APPLICATION OF WOUND VAC Right 06/13/2022   Procedure: APPLICATION OF WOUND VAC;  Surgeon: Nadara Mustard, MD;  Location:  [facility] OR;  Service: Orthopedics;  Laterality: Right;   AV FISTULA PLACEMENT Left 04/02/2022   Procedure: LEFT ARM  ARTERIOVENOUS (AV) FISTULA CREATION;  Surgeon: Nada Libman, MD;  Location: [facility] OR;  Service: Vascular;  Laterality: Left;  PERIPHERAL NERVE BLOCK   BASCILIC VEIN TRANSPOSITION Left 07/31/2022   Procedure: LEFT ARM SECOND STAGE BASILIC VEIN TRANSPOSITION;  Surgeon: Nada Libman, MD;  Location: [facility] OR;  Service: Vascular;  Laterality: Left;   BIOPSY  05/27/2020   Procedure: BIOPSY;  Surgeon: Lanelle Bal, DO;  Location: [facility];  Service: Endoscopy;;   CATARACT EXTRACTION, BILATERAL Bilateral ?2013   COLONOSCOPY W/ POLYPECTOMY     COLONOSCOPY WITH PROPOFOL N/A 03/13/2019   Procedure: COLONOSCOPY WITH PROPOFOL;  Surgeon: Beverley Fiedler, MD;  Location: [facility] ENDOSCOPY;  Service: Gastroenterology;  Laterality: N/A;   CORONARY ANGIOPLASTY WITH STENT PLACEMENT  1999; 2007   "1 + 1"   ERCP N/A 02/03/2022   Procedure: ENDOSCOPIC RETROGRADE CHOLANGIOPANCREATOGRAPHY (ERCP);  Surgeon: Jeani Hawking, MD;  Location: [facility] ENDOSCOPY;  Service: Gastroenterology;  Laterality: N/A;   ESOPHAGOGASTRODUODENOSCOPY (EGD) WITH PROPOFOL N/A 03/13/2019   Procedure: ESOPHAGOGASTRODUODENOSCOPY (EGD) WITH PROPOFOL;  Surgeon: Beverley Fiedler, MD;  Location: [facility] ENDOSCOPY;  Service: Gastroenterology;  Laterality: N/A;   ESOPHAGOGASTRODUODENOSCOPY (EGD) WITH PROPOFOL N/A 05/27/2020   Procedure: ESOPHAGOGASTRODUODENOSCOPY (EGD) WITH PROPOFOL;  Surgeon: Lanelle Bal, DO;  [facility];  Service: Endoscopy;  Laterality: N/A;   ESOPHAGOGASTRODUODENOSCOPY (EGD) WITH PROPOFOL N/A 09/03/2022   Procedure: ESOPHAGOGASTRODUODENOSCOPY (EGD) WITH PROPOFOL;  Surgeon: Jeani Hawking, MD;  Location: [facility] ENDOSCOPY;  Service: Gastroenterology;  Laterality: N/A;   EYE SURGERY Bilateral    lazer   FLEXIBLE SIGMOIDOSCOPY N/A 05/23/2022   Procedure: FLEXIBLE SIGMOIDOSCOPY;  Surgeon: Jeani Hawking, MD;  Location: [facility] ENDOSCOPY;  Service: Gastroenterology;  Laterality: N/A;   FLEXIBLE SIGMOIDOSCOPY N/A 05/24/2022   Procedure:  FLEXIBLE SIGMOIDOSCOPY;  Surgeon: Imogene Burn, MD;  Location: [facility] ENDOSCOPY;  Service: Gastroenterology;  Laterality: N/A;   FLEXIBLE SIGMOIDOSCOPY N/A 09/03/2022   Procedure: FLEXIBLE SIGMOIDOSCOPY;  Surgeon: Jeani Hawking, MD;  Location: [facility] ENDOSCOPY;  Service: Gastroenterology;  Laterality: N/A;   HEMOSTASIS CLIP PLACEMENT  03/13/2019   Procedure: HEMOSTASIS CLIP PLACEMENT;  Surgeon: Beverley Fiedler, MD;  Location: [facility] ENDOSCOPY;  Service: Gastroenterology;;   HEMOSTASIS CLIP PLACEMENT  05/23/2022   Procedure: HEMOSTASIS CLIP PLACEMENT;  Surgeon: Jeani Hawking, MD;  Location: [facility] ENDOSCOPY;  Service: Gastroenterology;;   HEMOSTASIS CONTROL  05/24/2022   Procedure: HEMOSTASIS CONTROL;  Surgeon: Imogene Burn, MD;  Location: [facility] ENDOSCOPY;  Service: Gastroenterology;;   HOT HEMOSTASIS N/A 05/23/2022   Procedure: HOT HEMOSTASIS (ARGON PLASMA COAGULATION/BICAP);  Surgeon: Jeani Hawking, MD;  Location: [facility] ENDOSCOPY;  Service: Gastroenterology;  Laterality: N/A;   I & D EXTREMITY Left 05/05/2022   Procedure: IRRIGATION AND DEBRIDEMENT LEFT ARM AV FISTULA;  Surgeon: Cephus Shelling, MD;  [facility]: [facility] OR;  Service: Vascular;  Laterality: Left;   INSERTION OF DIALYSIS CATHETER Right 04/02/2022   Procedure: INSERTION OF TUNNELED DIALYSIS CATHETER;  Surgeon: Nada Libman, MD;  Location: [facility] OR;  Service: Vascular;  Laterality: Right;   KNEE ARTHROSCOPY Left 10/25/2006   POLYPECTOMY  03/13/2019   Procedure: POLYPECTOMY;  Surgeon: Beverley Fiedler, MD;  Location: [facility] ENDOSCOPY;  Service: Gastroenterology;;   REMOVAL OF STONES  02/03/2022   Procedure: REMOVAL OF STONES;  Surgeon: Jeani Hawking, MD;  Location: [facility] ENDOSCOPY;  Service: Gastroenterology;;   REVISON OF ARTERIOVENOUS FISTULA Left 08/20/2022  Procedure: REVISON OF LEFT ARM ARTERIOVENOUS FISTULA;  Surgeon: Nada Libman, MD;  [facility];  Service: Vascular;  Laterality: Left;   RIGHT HEART CATH N/A 07/24/2017   Procedure: RIGHT HEART CATH;   Surgeon: Dolores Patty, MD;  [facility] INVASIVE CV LAB;  Service: Cardiovascular;  Laterality: N/A;   RIGHT HEART CATHETERIZATION N/A 09/22/2013   Procedure: RIGHT HEART CATH;  Surgeon: Dolores Patty, MD;  Location: [facility] CATH LAB;  Service: Cardiovascular;  Laterality: N/A;   SHOULDER ARTHROSCOPY WITH OPEN ROTATOR CUFF REPAIR Right 03/14/2014   Procedure: RIGHT SHOULDER ARTHROSCOPY WITH BICEPS RELEASE, OPEN SUBSCAPULA REPAIR, OPEN SUPRASPINATUS REPAIR.;  Surgeon: Cammy Copa, MD;  Location: [facility] OR;  Service: Orthopedics;  Laterality: Right;   SPHINCTEROTOMY  02/03/2022   Procedure: SPHINCTEROTOMY;  Surgeon: Jeani Hawking, MD;  Location: [facility] ENDOSCOPY;  Service: Gastroenterology;;   STUMP REVISION Right 06/13/2022   Procedure: REVISION RIGHT BELOW KNEE AMPUTATION;  Surgeon: Nadara Mustard, MD;  Location: [facility] OR;  Service: Orthopedics;  Laterality: Right;   TEE WITHOUT CARDIOVERSION N/A 02/04/2022   Procedure: TRANSESOPHAGEAL ECHOCARDIOGRAM (TEE);  Surgeon: Dolores Patty, MD;  Location: [name] ENDOSCOPY;  Service: Cardiovascular;  Laterality: N/A;   THROMBECTOMY W/ EMBOLECTOMY Left 08/20/2022   Procedure: THROMBECTOMY OF LEFT ARM ARTERIOVENOUS FISTULA;  Surgeon: Nada Libman, MD;  Location[facility];  Service: Vascular;  Laterality: Left;   TOE AMPUTATION Right 02/24/2018   GREAT TOE AND 2ND TOE AMPUTATION   TUBAL LIGATION  1970's   Social History   Occupational History    Employer: UNEMPLOYED  Tobacco Use   Smoking status: Former    Packs/day: 3.00    Years: 32.00    Additional pack years: 0.00    Total pack years: 96.00    Types: Cigarettes    Quit date: 10/24/1997    Years since quitting: 24.9   Smokeless tobacco: Never  Vaping Use   Vaping Use: Never used  Substance and Sexual Activity   Alcohol use: Not Currently    Comment: "might have 2-3 daiquiris in the summer"   Drug use: No   Sexual activity: Not Currently    Birth control/protection: Surgical    Comment:  Hysterectomy

## 2022-10-14 ENCOUNTER — Telehealth: Payer: Self-pay | Admitting: Family Medicine

## 2022-10-14 DIAGNOSIS — D631 Anemia in chronic kidney disease: Secondary | ICD-10-CM | POA: Diagnosis not present

## 2022-10-14 DIAGNOSIS — D689 Coagulation defect, unspecified: Secondary | ICD-10-CM | POA: Diagnosis not present

## 2022-10-14 DIAGNOSIS — N2581 Secondary hyperparathyroidism of renal origin: Secondary | ICD-10-CM | POA: Diagnosis not present

## 2022-10-14 DIAGNOSIS — N186 End stage renal disease: Secondary | ICD-10-CM | POA: Diagnosis not present

## 2022-10-14 DIAGNOSIS — Z992 Dependence on renal dialysis: Secondary | ICD-10-CM | POA: Diagnosis not present

## 2022-10-14 NOTE — Telephone Encounter (Signed)
Patient's spouse Ramon Dredge dropped off FMLA paperwork for provider to complete and sign.   Paperwork placed on nurse's desk.   Requesting call when forms ready for pick up at [phone].

## 2022-10-15 ENCOUNTER — Telehealth (HOSPITAL_COMMUNITY): Payer: Self-pay

## 2022-10-15 DIAGNOSIS — R609 Edema, unspecified: Secondary | ICD-10-CM | POA: Diagnosis not present

## 2022-10-15 DIAGNOSIS — Z79899 Other long term (current) drug therapy: Secondary | ICD-10-CM

## 2022-10-15 NOTE — Telephone Encounter (Signed)
-----   Message from Allayne Butcher, [name] sent at 10/08/2022  5:31 PM EDT ----- TSH elevated. This may be due to amiodarone. Will need f/u labs to check free  T3/T4

## 2022-10-15 NOTE — Telephone Encounter (Signed)
Patient advised and verbalized understanding,lab appointment scheduled,lab orders entered, appt pushed out due to patients schedule.   Orders Placed This Encounter  Procedures   T4, free    Standing Status:   Future    Standing Expiration Date:   10/15/2023    Order Specific Question:   Release to patient    Answer:   Immediate [1]   T3, free    Standing Status:   Future    Standing Expiration Date:   10/15/2023    Order Specific Question:   Release to patient    Answer:   Immediate [1]

## 2022-10-16 DIAGNOSIS — I5032 Chronic diastolic (congestive) heart failure: Secondary | ICD-10-CM | POA: Diagnosis not present

## 2022-10-21 DIAGNOSIS — D689 Coagulation defect, unspecified: Secondary | ICD-10-CM | POA: Diagnosis not present

## 2022-10-21 DIAGNOSIS — Z992 Dependence on renal dialysis: Secondary | ICD-10-CM | POA: Diagnosis not present

## 2022-10-21 DIAGNOSIS — N186 End stage renal disease: Secondary | ICD-10-CM | POA: Diagnosis not present

## 2022-10-21 DIAGNOSIS — N2581 Secondary hyperparathyroidism of renal origin: Secondary | ICD-10-CM | POA: Diagnosis not present

## 2022-10-22 ENCOUNTER — Ambulatory Visit (INDEPENDENT_AMBULATORY_CARE_PROVIDER_SITE_OTHER): Payer: PPO | Admitting: Family

## 2022-10-22 ENCOUNTER — Encounter: Payer: Self-pay | Admitting: Family

## 2022-10-22 DIAGNOSIS — I890 Lymphedema, not elsewhere classified: Secondary | ICD-10-CM | POA: Diagnosis not present

## 2022-10-22 DIAGNOSIS — L98491 Non-pressure chronic ulcer of skin of other sites limited to breakdown of skin: Secondary | ICD-10-CM | POA: Diagnosis not present

## 2022-10-22 DIAGNOSIS — I87332 Chronic venous hypertension (idiopathic) with ulcer and inflammation of left lower extremity: Secondary | ICD-10-CM | POA: Diagnosis not present

## 2022-10-22 NOTE — Progress Notes (Signed)
Office Visit Note   Patient: Susan [name]           Date of Birth: 12-10-1950           MRN: [id number] Visit Date: 10/22/2022              Requested by: Donita Brooks, MD [address] [place],  [place] [address] PCP: Donita Brooks, MD  Chief Complaint  Patient presents with   Left Leg - Follow-up      HPI: The patient is a 72 year old woman seen in follow-up for venous insufficiency with ischemic ulcer to the left lower extremity.  She does have a history of right transtibial amputation as well.  Has been in a Dynaflex compression wrap on the left for the last 1 week this has rolled down some she is complaining of left medial calf pain which is new  Assessment & Plan: Visit Diagnoses: No diagnosis found.  Plan: New is ischemic ulcer to the left medial calf.  Discussed using compression garments which can be removed and changed out daily the patient is resistant to use compression garments today she would like to trial an Radio broadcast assistant.  Follow-Up Instructions: No follow-ups on file.   Ortho Exam  Patient is alert, oriented, no adenopathy, well-dressed, normal affect, normal respiratory effort. On examination of the left lower extremity she has wrinkling of the skin up to the layer of the compression wrap.  Laterally there is a 2 x 2 cm in diameter area of ischemic ulceration covered with eschar there is no surrounding erythema no drainage no abscess.  To the proximal medial calf she does have a new area of ischemic pressure injury to her skin.  This is not open there is no drainage or erythema or warmth  Imaging: No results found. No images are attached to the encounter.  Labs: Lab Results  Component Value Date   HGBA1C 7.9 (H) 08/27/2022   HGBA1C 8.9 (H) 05/02/2022   HGBA1C >15.5 (H) 01/20/2022   CRP 0.6 09/01/2022   CRP 0.7 08/22/2020   CRP 1.5 (H) 03/13/2020   LABURIC 4.7 08/09/2020   LABURIC 9.1 (H) 10/12/2018   LABURIC 9.0 (H) 03/06/2018   REPTSTATUS  08/30/2022 FINAL 08/29/2022   GRAMSTAIN  05/05/2022    FEW WBC PRESENT, PREDOMINANTLY PMN NO ORGANISMS SEEN    CULT MULTIPLE SPECIES PRESENT, SUGGEST RECOLLECTION (A) 08/29/2022   LABORGA SERRATIA MARCESCENS 05/05/2022     Lab Results  Component Value Date   ALBUMIN 2.7 (L) 10/08/2022   ALBUMIN 2.9 (L) 09/08/2022   ALBUMIN 2.2 (L) 09/05/2022   PREALBUMIN 13 (L) 05/02/2022    Lab Results  Component Value Date   MG 1.9 09/04/2022   MG 1.8 09/03/2022   MG 1.6 (L) 09/02/2022   Lab Results  Component Value Date   VD25OH 42.14 05/02/2022    Lab Results  Component Value Date   PREALBUMIN 13 (L) 05/02/2022      Latest Ref Rng & Units 09/08/2022    9:46 AM 09/05/2022   12:51 PM 09/05/2022    6:00 AM  CBC EXTENDED  WBC 4.0 - 10.5 K/uL 5.2  4.5  5.1   RBC 3.87 - 5.11 MIL/uL 4.79  3.89  4.14   Hemoglobin 12.0 - 15.0 g/dL 19.1  9.1  9.8   HCT 47.8 - 46.0 % 38.4  31.2  32.7   Platelets 150 - 400 K/uL 228  176  184   NEUT# 1.7 -  7.7 K/uL 4.6     Lymph# 0.7 - 4.0 K/uL 0.3        There is no height or weight on file to calculate BMI.  Orders:  No orders of the defined types were placed in this encounter.  No orders of the defined types were placed in this encounter.    Procedures: No procedures performed  Clinical Data: No additional findings.  ROS:  All other systems negative, except as noted in the HPI. Review of Systems  Objective: Vital Signs: There were no vitals taken for this visit.  Specialty Comments:  No specialty comments available.  PMFS History: Patient Active Problem List   Diagnosis Date Noted   Secondary hypercoagulable state (HCC) 09/04/2022   Right sided abdominal pain 08/31/2022   Constipation 06/07/2022   History of Clostridioides difficile colitis 06/06/2022   Below-knee amputation of right lower extremity (HCC) 06/06/2022   Diverticulitis 06/05/2022   Stercoral colitis 06/05/2022   C. 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) 04/07/2022   Allergy, unspecified, initial encounter 04/07/2022   Dependence on renal dialysis (HCC) 04/07/2022   Gout due to renal impairment, unspecified site 04/07/2022   Hypertensive heart and chronic kidney disease with heart failure and with stage 5 chronic kidney disease, or end stage renal disease (HCC) 04/07/2022   Personal history of transient ischemic attack (TIA), and cerebral infarction without residual deficits 04/07/2022   Renal osteodystrophy 04/07/2022   Venous stasis ulcer of right calf (HCC) 03/31/2022   Fistula, colovaginal 03/26/2022   Diarrhea 03/26/2022   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   Sepsis without acute organ dysfunction (HCC)    Bacteremia    Acute pancreatitis 02/01/2022   Abdominal pain 02/01/2022   SIRS (systemic inflammatory response syndrome) (HCC) 02/01/2022   Transaminitis 02/01/2022   History of anemia due to chronic kidney disease 02/01/2022   Paroxysmal atrial fibrillation (HCC) 02/01/2022   DKA (diabetic ketoacidosis) (HCC) 01/14/2022   NSTEMI (non-ST elevated myocardial infarction) (HCC) 03/05/2021   Acute renal failure superimposed on stage 4 chronic kidney disease (HCC) 08/22/2020   Hypoalbuminemia 05/25/2020   GERD (gastroesophageal reflux disease) 05/25/2020   Pressure injury of skin 05/17/2020   Acute on chronic combined systolic and diastolic congestive heart failure (HCC) 03/07/2020   Type 2  diabetes mellitus with diabetic polyneuropathy, with long-term current use of insulin (HCC) 03/07/2020   Obesity, Class III, BMI 40-49.9 (morbid obesity) (HCC) 03/07/2020   Common bile duct (CBD) obstruction 05/28/2019   Benign neoplasm of ascending colon    Benign neoplasm of transverse colon    Benign neoplasm of descending colon    Benign neoplasm of sigmoid colon    Gastric polyps    Hyperkalemia 03/11/2019   Prolonged QT interval 03/11/2019   Acute blood loss anemia 03/11/2019   Onychomycosis 06/21/2018   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)    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 (HCC)    PVD (peripheral vascular disease) (HCC) 10/26/2017  E-coli UTI 07/27/2017   Hypothyroidism 07/27/2017   AKI (acute kidney injury) (HCC)    PAH (pulmonary artery hypertension) (HCC)    Impaired ambulation 07/19/2017   Nausea & vomiting 07/15/2017   Leg cramps 02/27/2017   Peripheral edema 01/12/2017   Diabetic neuropathy (HCC) 11/12/2016   CKD (chronic kidney disease), stage IV (HCC) 10/24/2015   Anemia of chronic disease 10/03/2015   Generalized anxiety disorder 10/03/2015   Insomnia 10/03/2015   Hyperglycemia due to diabetes mellitus (HCC) 06/07/2015   Chronic diastolic CHF (congestive heart failure) (HCC) 06/07/2015   Non compliance with medical treatment 04/17/2014   Rotator cuff tear 03/14/2014   Class 3 obesity (HCC) 09/23/2013   Chronic HFrEF (heart failure with reduced ejection fraction) (HCC) 06/03/2013   Hypotension 12/25/2012   Urinary incontinence    MDD (major depressive disorder) 11/12/2010   RBBB (right bundle branch block)    Wide-complex tachycardia    Coronary artery disease    Hyperlipemia 01/22/2009   Essential hypertension 01/22/2009   Past Medical History:  Diagnosis Date   Acute MI (HCC) 1999; 2007; 03/05/21   Anemia    hx   Anginal pain (HCC)    Anxiety    ARF (acute renal failure) (HCC) 06/2017   [facility]    Arthritis    "generalized" (03/15/2014)   CAD (coronary artery disease)    MI in 2000 - MI  2007 - treated bare metal stent (no nuclear since then as 9/11)   Carotid artery disease (HCC)    CHF (congestive heart failure) (HCC)    HFrEF 03/06/21   Chronic diastolic heart failure (HCC)    a) ECHO (08/2013) EF 55-60% and RV function nl b) RHC (08/2013) RA 4, RV 30/5/7, PA 25/10 (16), PCWP 7, Fick CO/CI 6.3/2.7, PVR 1.5 WU, PA 61 and 66%   Daily headache    "~ every other day; since I fell in June" (03/15/2014)   Depression    Dyslipidemia    Dyspnea    uses oxygen qhs via [place]   ESRD (end stage renal disease) (HCC)    Dialysis on Tues Thurs Sat   Exertional shortness of breath    History of kidney stones    HTN (hypertension)    Hypothyroidism    Neuropathy    Obesity    Osteoarthritis    PAF (paroxysmal atrial fibrillation) (HCC)    Peripheral neuropathy    bilateral feet/hands   PONV (postoperative nausea and vomiting)    RBBB (right bundle branch block)    Old   Stroke (HCC)    mini strokes   Syncope    likely due to low blood sugar   Tachycardia    Sinus tachycardia   Type II diabetes mellitus (HCC)    Type II, [name]. patient has omnipod insulin pump with Novolin R Insulin   Urinary incontinence    Venous insufficiency     Family History  Problem Relation Age of Onset   Heart attack Mother 10    Past Surgical History:  Procedure Laterality Date   ABDOMINAL HYSTERECTOMY  1980's   AMPUTATION Right 02/24/2018   Procedure: RIGHT FOOT GREAT TOE AND 2ND TOE AMPUTATION;  Surgeon: Nadara Mustard, MD;  Location: [facility] OR;  Service: Orthopedics;  Laterality: Right;   AMPUTATION Right 04/30/2018   Procedure: RIGHT TRANSMETATARSAL AMPUTATION;  Surgeon: Nadara Mustard, MD;  Location[facility] OR;  Service: Orthopedics;  Laterality: Right;   AMPUTATION Right 05/02/2022  Procedure: RIGHT BELOW KNEE AMPUTATION;  Surgeon: Nadara Mustard, MD;  Location: [facility] OR;   Service: Orthopedics;  Laterality: Right;   APPLICATION OF WOUND VAC Right 06/13/2022   Procedure: APPLICATION OF WOUND VAC;  Surgeon: Nadara Mustard, MD;  Location: [facility] OR;  Service: Orthopedics;  Laterality: Right;   AV FISTULA PLACEMENT Left 04/02/2022   Procedure: LEFT ARM ARTERIOVENOUS (AV) FISTULA CREATION;  Surgeon: Nada Libman, MD;  Location: [facility] OR;  Service: Vascular;  Laterality: Left;  PERIPHERAL NERVE BLOCK   BASCILIC VEIN TRANSPOSITION Left 07/31/2022   Procedure: LEFT ARM SECOND STAGE BASILIC VEIN TRANSPOSITION;  Surgeon: Nada Libman, MD;  Location: [facility] OR;  Service: Vascular;  Laterality: Left;   BIOPSY  05/27/2020   Procedure: BIOPSY;  Surgeon: Lanelle Bal, DO;  Location: [facility];  Service: Endoscopy;;   CATARACT EXTRACTION, BILATERAL Bilateral ?2013   COLONOSCOPY W/ POLYPECTOMY     COLONOSCOPY WITH PROPOFOL N/A 03/13/2019   Procedure: COLONOSCOPY WITH PROPOFOL;  Surgeon: Beverley Fiedler, MD;  Location: [facility] ENDOSCOPY;  Service: Gastroenterology;  Laterality: N/A;   CORONARY ANGIOPLASTY WITH STENT PLACEMENT  1999; 2007   "1 + 1"   ERCP N/A 02/03/2022   Procedure: ENDOSCOPIC RETROGRADE CHOLANGIOPANCREATOGRAPHY (ERCP);  Surgeon: Jeani Hawking, MD;  Location: [facility] ENDOSCOPY;  Service: Gastroenterology;  Laterality: N/A;   ESOPHAGOGASTRODUODENOSCOPY (EGD) WITH PROPOFOL N/A 03/13/2019   Procedure: ESOPHAGOGASTRODUODENOSCOPY (EGD) WITH PROPOFOL;  Surgeon: Beverley Fiedler, MD;  Location: [facility] ENDOSCOPY;  Service: Gastroenterology;  Laterality: N/A;   ESOPHAGOGASTRODUODENOSCOPY (EGD) WITH PROPOFOL N/A 05/27/2020   Procedure: ESOPHAGOGASTRODUODENOSCOPY (EGD) WITH PROPOFOL;  Surgeon: Lanelle Bal, DO;  [facility];  Service: Endoscopy;  Laterality: N/A;   ESOPHAGOGASTRODUODENOSCOPY (EGD) WITH PROPOFOL N/A 09/03/2022   Procedure: ESOPHAGOGASTRODUODENOSCOPY (EGD) WITH PROPOFOL;  Surgeon: Jeani Hawking, MD;  Location: [facility] ENDOSCOPY;  Service: Gastroenterology;  Laterality:  N/A;   EYE SURGERY Bilateral    lazer   FLEXIBLE SIGMOIDOSCOPY N/A 05/23/2022   Procedure: FLEXIBLE SIGMOIDOSCOPY;  Surgeon: Jeani Hawking, MD;  Location: [facility] ENDOSCOPY;  Service: Gastroenterology;  Laterality: N/A;   FLEXIBLE SIGMOIDOSCOPY N/A 05/24/2022   Procedure: FLEXIBLE SIGMOIDOSCOPY;  Surgeon: Imogene Burn, MD;  Location: [facility] ENDOSCOPY;  Service: Gastroenterology;  Laterality: N/A;   FLEXIBLE SIGMOIDOSCOPY N/A 09/03/2022   Procedure: FLEXIBLE SIGMOIDOSCOPY;  Surgeon: Jeani Hawking, MD;  Location: [facility] ENDOSCOPY;  Service: Gastroenterology;  Laterality: N/A;   HEMOSTASIS CLIP PLACEMENT  03/13/2019   Procedure: HEMOSTASIS CLIP PLACEMENT;  Surgeon: Beverley Fiedler, MD;  Location: [facility] ENDOSCOPY;  Service: Gastroenterology;;   HEMOSTASIS CLIP PLACEMENT  05/23/2022   Procedure: HEMOSTASIS CLIP PLACEMENT;  Surgeon: Jeani Hawking, MD;  Location: [facility] ENDOSCOPY;  Service: Gastroenterology;;   HEMOSTASIS CONTROL  05/24/2022   Procedure: HEMOSTASIS CONTROL;  Surgeon: Imogene Burn, MD;  Location: [facility] ENDOSCOPY;  Service: Gastroenterology;;   HOT HEMOSTASIS N/A 05/23/2022   Procedure: HOT HEMOSTASIS (ARGON PLASMA COAGULATION/BICAP);  Surgeon: Jeani Hawking, MD;  Location: [facility] ENDOSCOPY;  Service: Gastroenterology;  Laterality: N/A;   I & D EXTREMITY Left 05/05/2022   Procedure: IRRIGATION AND DEBRIDEMENT LEFT ARM AV FISTULA;  Surgeon: Cephus Shelling, MD;  Location: [facility] OR;  Service: Vascular;  Laterality: Left;   INSERTION OF DIALYSIS CATHETER Right 04/02/2022   Procedure: INSERTION OF TUNNELED DIALYSIS CATHETER;  Surgeon: Nada Libman, MD;  Location: [facility] OR;  Service: Vascular;  Laterality: Right;   KNEE ARTHROSCOPY Left 10/25/2006   POLYPECTOMY  03/13/2019   Procedure: POLYPECTOMY;  Surgeon: Rhea Belton,  Carie Caddy, MD;  Location: [facility] ENDOSCOPY;  Service: Gastroenterology;;   REMOVAL OF STONES  02/03/2022   Procedure: REMOVAL OF STONES;  Surgeon: Jeani Hawking, MD;  Location: [facility] ENDOSCOPY;  Service:  Gastroenterology;;   REVISON OF ARTERIOVENOUS FISTULA Left 08/20/2022   Procedure: REVISON OF LEFT ARM ARTERIOVENOUS FISTULA;  Surgeon: Nada Libman, MD;  Location: [facility] OR;  Service: Vascular;  Laterality: Left;   RIGHT HEART CATH N/A 07/24/2017   Procedure: RIGHT HEART CATH;  Surgeon: Dolores Patty, MD;  Location: [facility] INVASIVE CV LAB;  Service: Cardiovascular;  Laterality: N/A;   RIGHT HEART CATHETERIZATION N/A 09/22/2013   Procedure: RIGHT HEART CATH;  Surgeon: Dolores Patty, MD;  Location: [facility] CATH LAB;  Service: Cardiovascular;  Laterality: N/A;   SHOULDER ARTHROSCOPY WITH OPEN ROTATOR CUFF REPAIR Right 03/14/2014   Procedure: RIGHT SHOULDER ARTHROSCOPY WITH BICEPS RELEASE, OPEN SUBSCAPULA REPAIR, OPEN SUPRASPINATUS REPAIR.;  Surgeon: Cammy Copa, MD;  Location: [facility] OR;  Service: Orthopedics;  Laterality: Right;   SPHINCTEROTOMY  02/03/2022   Procedure: SPHINCTEROTOMY;  Surgeon: Jeani Hawking, MD;  Location: [facility] ENDOSCOPY;  Service: Gastroenterology;;   STUMP REVISION Right 06/13/2022   Procedure: REVISION RIGHT BELOW KNEE AMPUTATION;  Surgeon: Nadara Mustard, MD;  Location: [facility] OR;  Service: Orthopedics;  Laterality: Right;   TEE WITHOUT CARDIOVERSION N/A 02/04/2022   Procedure: TRANSESOPHAGEAL ECHOCARDIOGRAM (TEE);  Surgeon: Dolores Patty, MD;  Location: [facility] ENDOSCOPY;  Service: Cardiovascular;  Laterality: N/A;   THROMBECTOMY W/ EMBOLECTOMY Left 08/20/2022   Procedure: THROMBECTOMY OF LEFT ARM ARTERIOVENOUS FISTULA;  Surgeon: Nada Libman, MD;  Location: [facility] OR;  Service: Vascular;  Laterality: Left;   TOE AMPUTATION Right 02/24/2018   GREAT TOE AND 2ND TOE AMPUTATION   TUBAL LIGATION  1970's   Social History   Occupational History    Employer: UNEMPLOYED  Tobacco Use   Smoking status: Former    Packs/day: 3.00    Years: 32.00    Additional pack years: 0.00    Total pack years: 96.00    Types: Cigarettes    Quit date: 10/24/1997    Years since quitting: 25.0    Smokeless tobacco: Never  Vaping Use   Vaping Use: Never used  Substance and Sexual Activity   Alcohol use: Not Currently    Comment: "might have 2-3 daiquiris in the summer"   Drug use: No   Sexual activity: Not Currently    Birth control/protection: Surgical    Comment: Hysterectomy

## 2022-10-23 DIAGNOSIS — I5043 Acute on chronic combined systolic (congestive) and diastolic (congestive) heart failure: Secondary | ICD-10-CM | POA: Diagnosis not present

## 2022-10-23 DIAGNOSIS — I5042 Chronic combined systolic (congestive) and diastolic (congestive) heart failure: Secondary | ICD-10-CM | POA: Diagnosis not present

## 2022-10-23 DIAGNOSIS — K626 Ulcer of anus and rectum: Secondary | ICD-10-CM | POA: Diagnosis not present

## 2022-10-23 DIAGNOSIS — M6281 Muscle weakness (generalized): Secondary | ICD-10-CM | POA: Diagnosis not present

## 2022-10-23 DIAGNOSIS — J9611 Chronic respiratory failure with hypoxia: Secondary | ICD-10-CM | POA: Diagnosis not present

## 2022-10-23 DIAGNOSIS — K219 Gastro-esophageal reflux disease without esophagitis: Secondary | ICD-10-CM | POA: Diagnosis not present

## 2022-10-23 NOTE — Telephone Encounter (Signed)
Completed and signed paperwork faxed to [facility] with a confirmation time stamp of 5.30 10:38. As requested, called patient to inform paperwork ready for pickup. No answer: left message on land line voicemail to request call back. Also called husband's cell; mailbox full.

## 2022-10-24 ENCOUNTER — Other Ambulatory Visit: Payer: Self-pay

## 2022-10-24 DIAGNOSIS — Z992 Dependence on renal dialysis: Secondary | ICD-10-CM | POA: Diagnosis not present

## 2022-10-24 DIAGNOSIS — T82590A Other mechanical complication of surgically created arteriovenous fistula, initial encounter: Secondary | ICD-10-CM

## 2022-10-24 DIAGNOSIS — N186 End stage renal disease: Secondary | ICD-10-CM | POA: Diagnosis not present

## 2022-10-24 DIAGNOSIS — N179 Acute kidney failure, unspecified: Secondary | ICD-10-CM | POA: Diagnosis not present

## 2022-10-24 MED ORDER — SODIUM CHLORIDE 0.9% FLUSH
3.0000 mL | Freq: Two times a day (BID) | INTRAVENOUS | Status: DC
Start: 2022-10-24 — End: 2022-11-10

## 2022-10-24 MED ORDER — SODIUM CHLORIDE 0.9 % IV SOLN: 250.0000 mL | INTRAVENOUS | Status: DC | PRN

## 2022-10-25 DIAGNOSIS — N2581 Secondary hyperparathyroidism of renal origin: Secondary | ICD-10-CM | POA: Diagnosis not present

## 2022-10-25 DIAGNOSIS — D689 Coagulation defect, unspecified: Secondary | ICD-10-CM | POA: Diagnosis not present

## 2022-10-25 DIAGNOSIS — N186 End stage renal disease: Secondary | ICD-10-CM | POA: Diagnosis not present

## 2022-10-25 DIAGNOSIS — Z992 Dependence on renal dialysis: Secondary | ICD-10-CM | POA: Diagnosis not present

## 2022-10-27 ENCOUNTER — Ambulatory Visit (INDEPENDENT_AMBULATORY_CARE_PROVIDER_SITE_OTHER): Payer: PPO | Admitting: Orthopedic Surgery

## 2022-10-27 DIAGNOSIS — Z89511 Acquired absence of right leg below knee: Secondary | ICD-10-CM | POA: Diagnosis not present

## 2022-10-27 DIAGNOSIS — S88111S Complete traumatic amputation at level between knee and ankle, right lower leg, sequela: Secondary | ICD-10-CM

## 2022-10-27 DIAGNOSIS — I87332 Chronic venous hypertension (idiopathic) with ulcer and inflammation of left lower extremity: Secondary | ICD-10-CM | POA: Diagnosis not present

## 2022-10-28 ENCOUNTER — Encounter: Payer: Self-pay | Admitting: Orthopedic Surgery

## 2022-10-28 ENCOUNTER — Telehealth: Payer: Self-pay | Admitting: Family Medicine

## 2022-10-28 DIAGNOSIS — N2581 Secondary hyperparathyroidism of renal origin: Secondary | ICD-10-CM | POA: Diagnosis not present

## 2022-10-28 DIAGNOSIS — Z992 Dependence on renal dialysis: Secondary | ICD-10-CM | POA: Diagnosis not present

## 2022-10-28 DIAGNOSIS — D631 Anemia in chronic kidney disease: Secondary | ICD-10-CM | POA: Diagnosis not present

## 2022-10-28 DIAGNOSIS — D689 Coagulation defect, unspecified: Secondary | ICD-10-CM | POA: Diagnosis not present

## 2022-10-28 DIAGNOSIS — N186 End stage renal disease: Secondary | ICD-10-CM | POA: Diagnosis not present

## 2022-10-28 NOTE — Progress Notes (Signed)
Office Visit Note   Patient: Susan [name]           Date of Birth: [birth year]           MRN: [id number] Visit Date: 10/27/2022              Requested by: Donita Brooks, MD [address] [place],  [place] [address] PCP: Donita Brooks, MD  Chief Complaint  Patient presents with   Left Leg - Wound Check      HPI: Patient is a 72 year old woman who presents for follow-up status post right transtibial amputation and status post compression wraps for venous ulceration left leg.  Patient states that her right leg was casted today for prosthesis.  Assessment & Plan: Visit Diagnoses:  1. 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 Below-knee amputation of right lower extremity, sequela (HCC)     Plan: Continue compression wraps to the left lower extremity.  Will request authorization for Kerecis tissue graft.  Follow-Up Instructions: Return in about 1 week (around 11/03/2022).   Ortho Exam  Patient is alert, oriented, no adenopathy, well-dressed, normal affect, normal respiratory effort. Examination of the left leg the leg is slowly improving there is no wrinkling of the skin the left calf ulcer measures 3 x 5 cm.  There is a superficial eschar.  Will plan on tissue grafting once this eschar demarcates.  Imaging: No results found.    Labs: Lab Results  Component Value Date   HGBA1C 7.9 (H) 08/27/2022   HGBA1C 8.9 (H) 05/02/2022   HGBA1C >15.5 (H) 01/20/2022   CRP 0.6 09/01/2022   CRP 0.7 08/22/2020   CRP 1.5 (H) 03/13/2020   LABURIC 4.7 08/09/2020   LABURIC 9.1 (H) 10/12/2018   LABURIC 9.0 (H) 03/06/2018   REPTSTATUS 08/30/2022 FINAL 08/29/2022   GRAMSTAIN  05/05/2022    FEW WBC PRESENT, PREDOMINANTLY PMN NO ORGANISMS SEEN    CULT MULTIPLE SPECIES PRESENT, SUGGEST RECOLLECTION (A) 08/29/2022   LABORGA SERRATIA MARCESCENS 05/05/2022     Lab Results  Component Value Date   ALBUMIN 2.7 (L) 10/08/2022   ALBUMIN  2.9 (L) 09/08/2022   ALBUMIN 2.2 (L) 09/05/2022   PREALBUMIN 13 (L) 05/02/2022    Lab Results  Component Value Date   MG 1.9 09/04/2022   MG 1.8 09/03/2022   MG 1.6 (L) 09/02/2022   Lab Results  Component Value Date   VD25OH 42.14 05/02/2022    Lab Results  Component Value Date   PREALBUMIN 13 (L) 05/02/2022      Latest Ref Rng & Units 09/08/2022    9:46 AM 09/05/2022   12:51 PM 09/05/2022    6:00 AM  CBC EXTENDED  WBC 4.0 - 10.5 K/uL 5.2  4.5  5.1   RBC 3.87 - 5.11 MIL/uL 4.79  3.89  4.14   Hemoglobin 12.0 - 15.0 g/dL 30.8  9.1  9.8   HCT 65.7 - 46.0 % 38.4  31.2  32.7   Platelets 150 - 400 K/uL 228  176  184   NEUT# 1.7 - 7.7 K/uL 4.6     Lymph# 0.7 - 4.0 K/uL 0.3        There is no height or weight on file to calculate BMI.  Orders:  No orders of the defined types were placed in this encounter.  No orders of the defined types were placed in this encounter.    Procedures: No procedures  performed  Clinical Data: No additional findings.  ROS:  All other systems negative, except as noted in the HPI. Review of Systems  Objective: Vital Signs: There were no vitals taken for this visit.  Specialty Comments:  No specialty comments available.  PMFS History: Patient Active Problem List   Diagnosis Date Noted   Secondary hypercoagulable state (HCC) 09/04/2022   Right sided abdominal pain 08/31/2022   Constipation 06/07/2022   History of Clostridioides difficile colitis 06/06/2022   Below-knee amputation of right lower extremity (HCC) 06/06/2022   Diverticulitis 06/05/2022   Stercoral colitis 06/05/2022   C. 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) 04/07/2022   Allergy, unspecified, initial encounter 04/07/2022   Dependence on renal dialysis (HCC) 04/07/2022   Gout due to renal impairment, unspecified site 04/07/2022   Hypertensive heart and chronic kidney disease with heart failure and with stage 5 chronic kidney disease, or end stage renal disease (HCC) 04/07/2022   Personal history of transient ischemic attack (TIA), and cerebral infarction without residual deficits 04/07/2022   Renal osteodystrophy 04/07/2022   Venous stasis ulcer of right calf (HCC) 03/31/2022   Fistula, colovaginal 03/26/2022   Diarrhea 03/26/2022   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   Sepsis without acute organ dysfunction (HCC)    Bacteremia    Acute pancreatitis 02/01/2022   Abdominal pain 02/01/2022   SIRS (systemic inflammatory response syndrome) (HCC) 02/01/2022   Transaminitis 02/01/2022   History of anemia due to chronic kidney disease 02/01/2022   Paroxysmal atrial fibrillation (HCC) 02/01/2022   DKA (diabetic ketoacidosis) (HCC) 01/14/2022   NSTEMI (non-ST elevated myocardial infarction) (HCC) 03/05/2021   Acute renal failure superimposed on stage 4 chronic kidney disease (HCC) 08/22/2020   Hypoalbuminemia 05/25/2020   GERD (gastroesophageal reflux disease) 05/25/2020   Pressure injury of skin 05/17/2020   Acute on chronic combined systolic and diastolic congestive heart failure (HCC) 03/07/2020   Type 2 diabetes mellitus with diabetic polyneuropathy, with long-term current use of insulin (HCC) 03/07/2020   Obesity, Class III, BMI 40-49.9 (morbid obesity) (HCC) 03/07/2020   Common bile duct (CBD) obstruction 05/28/2019   Benign neoplasm of ascending colon    Benign neoplasm of transverse colon     Benign neoplasm of descending colon    Benign neoplasm of sigmoid colon    Gastric polyps    Hyperkalemia 03/11/2019   Prolonged QT interval 03/11/2019   Acute blood loss anemia 03/11/2019   Onychomycosis 06/21/2018   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)    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 (HCC)    PVD (peripheral vascular disease) (HCC) 10/26/2017   E-coli UTI 07/27/2017   Hypothyroidism 07/27/2017   AKI (acute kidney injury) (HCC)    PAH (pulmonary artery hypertension) (HCC)    Impaired ambulation 07/19/2017   Nausea & vomiting 07/15/2017   Leg cramps 02/27/2017   Peripheral edema 01/12/2017   Diabetic neuropathy (HCC) 11/12/2016   CKD (chronic kidney disease), stage IV (HCC) 10/24/2015  Anemia of chronic disease 10/03/2015   Generalized anxiety disorder 10/03/2015   Insomnia 10/03/2015   Hyperglycemia due to diabetes mellitus (HCC) 06/07/2015   Chronic diastolic CHF (congestive heart failure) (HCC) 06/07/2015   Non compliance with medical treatment 04/17/2014   Rotator cuff tear 03/14/2014   Class 3 obesity (HCC) 09/23/2013   Chronic HFrEF (heart failure with reduced ejection fraction) (HCC) 06/03/2013   Hypotension 12/25/2012   Urinary incontinence    MDD (major depressive disorder) 11/12/2010   RBBB (right bundle branch block)    Wide-complex tachycardia    Coronary artery disease    Hyperlipemia 01/22/2009   Essential hypertension 01/22/2009   Past Medical History:  Diagnosis Date   Acute MI (HCC) 1999; 2007; 03/05/21   Anemia    hx   Anginal pain (HCC)    Anxiety    ARF (acute renal failure) (HCC) 06/2017   [facility]   Arthritis    "generalized" (03/15/2014)   CAD (coronary artery disease)    MI in 2000 - MI  2007 - treated bare metal stent (no nuclear since then as 9/11)   Carotid artery disease (HCC)    CHF (congestive heart failure) (HCC)    HFrEF 03/06/21   Chronic diastolic heart failure (HCC)    a)  ECHO (08/2013) EF 55-60% and RV function nl b) RHC (08/2013) RA 4, RV 30/5/7, PA 25/10 (16), PCWP 7, Fick CO/CI 6.3/2.7, PVR 1.5 WU, PA 61 and 66%   Daily headache    "~ every other day; since I fell in June" (03/15/2014)   Depression    Dyslipidemia    Dyspnea    uses oxygen qhs via [place]   ESRD (end stage renal disease) (HCC)    Dialysis on Tues Thurs Sat   Exertional shortness of breath    History of kidney stones    HTN (hypertension)    Hypothyroidism    Neuropathy    Obesity    Osteoarthritis    PAF (paroxysmal atrial fibrillation) (HCC)    Peripheral neuropathy    bilateral feet/hands   PONV (postoperative nausea and vomiting)    RBBB (right bundle branch block)    Old   Stroke (HCC)    mini strokes   Syncope    likely due to low blood sugar   Tachycardia    Sinus tachycardia   Type II diabetes mellitus (HCC)    Type II, [name]. patient has omnipod insulin pump with Novolin R Insulin   Urinary incontinence    Venous insufficiency     Family History  Problem Relation Age of Onset   Heart attack Mother 43    Past Surgical History:  Procedure Laterality Date   ABDOMINAL HYSTERECTOMY  1980's   AMPUTATION Right 02/24/2018   Procedure: RIGHT FOOT GREAT TOE AND 2ND TOE AMPUTATION;  Surgeon: Nadara Mustard, MD;  Location: [facility] OR;  Service: Orthopedics;  Laterality: Right;   AMPUTATION Right 04/30/2018   Procedure: RIGHT TRANSMETATARSAL AMPUTATION;  Surgeon: Nadara Mustard, MD;  Location: [facility] OR;  Service: Orthopedics;  Laterality: Right;   AMPUTATION Right 05/02/2022   Procedure: RIGHT BELOW KNEE AMPUTATION;  Surgeon: Nadara Mustard, MD;  Location: [facility] OR;  Service: Orthopedics;  Laterality: Right;   APPLICATION OF WOUND VAC Right 06/13/2022   Procedure: APPLICATION OF WOUND VAC;  Surgeon: Nadara Mustard, MD;  Location: [facility] OR;  Service: Orthopedics;  Laterality: Right;   AV FISTULA PLACEMENT Left 04/02/2022  Procedure: LEFT ARM ARTERIOVENOUS (AV) FISTULA  CREATION;  Surgeon: Nada Libman, MD;  Location: [facility] OR;  Service: Vascular;  Laterality: Left;  PERIPHERAL NERVE BLOCK   BASCILIC VEIN TRANSPOSITION Left 07/31/2022   Procedure: LEFT ARM SECOND STAGE BASILIC VEIN TRANSPOSITION;  Surgeon: Nada Libman, MD;  Location: [facility] OR;  Service: Vascular;  Laterality: Left;   BIOPSY  05/27/2020   Procedure: BIOPSY;  Surgeon: Lanelle Bal, DO;  Location: [facility]O [facility]ITE;  Service: Endoscopy;;   CATARACT EXTRACTION, BILATERAL Bilateral ?2013   COLONOSCOPY W/ POLYPECTOMY     COLONOSCOPY WITH PROPOFOL N/A 03/13/2019   Procedure: COLONOSCOPY WITH PROPOFOL;  Surgeon: Beverley Fiedler, MD;  Location: [facility] ENDOSCOPY;  Service: Gastroenterology;  Laterality: N/A;   CORONARY ANGIOPLASTY WITH STENT PLACEMENT  1999; 2007   "1 + 1"   ERCP N/A 02/03/2022   Procedure: ENDOSCOPIC RETROGRADE CHOLANGIOPANCREATOGRAPHY (ERCP);  Surgeon: Jeani Hawking, MD;  Location: [facility] ENDOSCOPY;  Service: Gastroenterology;  Laterality: N/A;   ESOPHAGOGASTRODUODENOSCOPY (EGD) WITH PROPOFOL N/A 03/13/2019   Procedure: ESOPHAGOGASTRODUODENOSCOPY (EGD) WITH PROPOFOL;  Surgeon: Beverley Fiedler, MD;  Location: [facility] ENDOSCOPY;  Service: Gastroenterology;  Laterality: N/A;   ESOPHAGOGASTRODUODENOSCOPY (EGD) WITH PROPOFOL N/A 05/27/2020   Procedure: ESOPHAGOGASTRODUODENOSCOPY (EGD) WITH PROPOFOL;  Surgeon: Lanelle Bal, DO;  [facility];  Service: Endoscopy;  Laterality: N/A;   ESOPHAGOGASTRODUODENOSCOPY (EGD) WITH PROPOFOL N/A 09/03/2022   Procedure: ESOPHAGOGASTRODUODENOSCOPY (EGD) WITH PROPOFOL;  Surgeon: Jeani Hawking, MD;  Location: [facility] ENDOSCOPY;  Service: Gastroenterology;  Laterality: N/A;   EYE SURGERY Bilateral    lazer   FLEXIBLE SIGMOIDOSCOPY N/A 05/23/2022   Procedure: FLEXIBLE SIGMOIDOSCOPY;  Surgeon: Jeani Hawking, MD;  Location: [facility] ENDOSCOPY;  Service: Gastroenterology;  Laterality: N/A;   FLEXIBLE SIGMOIDOSCOPY N/A 05/24/2022   Procedure: FLEXIBLE SIGMOIDOSCOPY;  Surgeon:  Imogene Burn, MD;  Location: [facility] ENDOSCOPY;  Service: Gastroenterology;  Laterality: N/A;   FLEXIBLE SIGMOIDOSCOPY N/A 09/03/2022   Procedure: FLEXIBLE SIGMOIDOSCOPY;  Surgeon: Jeani Hawking, MD;  Location: [facility] ENDOSCOPY;  Service: Gastroenterology;  Laterality: N/A;   HEMOSTASIS CLIP PLACEMENT  03/13/2019   Procedure: HEMOSTASIS CLIP PLACEMENT;  Surgeon: Beverley Fiedler, MD;  Location: [facility] ENDOSCOPY;  Service: Gastroenterology;;   HEMOSTASIS CLIP PLACEMENT  05/23/2022   Procedure: HEMOSTASIS CLIP PLACEMENT;  Surgeon: Jeani Hawking, MD;  Location: [facility] ENDOSCOPY;  Service: Gastroenterology;;   HEMOSTASIS CONTROL  05/24/2022   Procedure: HEMOSTASIS CONTROL;  Surgeon: Imogene Burn, MD;  Location: [facility] ENDOSCOPY;  Service: Gastroenterology;;   HOT HEMOSTASIS N/A 05/23/2022   Procedure: HOT HEMOSTASIS (ARGON PLASMA COAGULATION/BICAP);  Surgeon: Jeani Hawking, MD;  Location: [facility] ENDOSCOPY;  Service: Gastroenterology;  Laterality: N/A;   I & D EXTREMITY Left 05/05/2022   Procedure: IRRIGATION AND DEBRIDEMENT LEFT ARM AV FISTULA;  Surgeon: Cephus Shelling, MD;  Location: [facility] OR;  Service: Vascular;  Laterality: Left;   INSERTION OF DIALYSIS CATHETER Right 04/02/2022   Procedure: INSERTION OF TUNNELED DIALYSIS CATHETER;  Surgeon: Nada Libman, MD;  Location: [facility] OR;  Service: Vascular;  Laterality: Right;   KNEE ARTHROSCOPY Left 10/25/2006   POLYPECTOMY  03/13/2019   Procedure: POLYPECTOMY;  Surgeon: Beverley Fiedler, MD;  Location: [facility] ENDOSCOPY;  Service: Gastroenterology;;   REMOVAL OF STONES  02/03/2022   Procedure: REMOVAL OF STONES;  Surgeon: Jeani Hawking, MD;  Location: [facility] ENDOSCOPY;  Service: Gastroenterology;;   REVISON OF ARTERIOVENOUS FISTULA Left 08/20/2022   Procedure: REVISON OF LEFT ARM ARTERIOVENOUS FISTULA;  Surgeon: Nada Libman, MD;  Location: [facility]  OR;  Service: Vascular;  Laterality: Left;   RIGHT HEART CATH N/A 07/24/2017   Procedure: RIGHT HEART CATH;  Surgeon: Dolores Patty, MD;   Location: [facility] INVASIVE CV LAB;  Service: Cardiovascular;  Laterality: N/A;   RIGHT HEART CATHETERIZATION N/A 09/22/2013   Procedure: RIGHT HEART CATH;  Surgeon: Dolores Patty, MD;  Location: [facility] CATH LAB;  Service: Cardiovascular;  Laterality: N/A;   SHOULDER ARTHROSCOPY WITH OPEN ROTATOR CUFF REPAIR Right 03/14/2014   Procedure: RIGHT SHOULDER ARTHROSCOPY WITH BICEPS RELEASE, OPEN SUBSCAPULA REPAIR, OPEN SUPRASPINATUS REPAIR.;  Surgeon: Cammy Copa, MD;  Location: [facility] OR;  Service: Orthopedics;  Laterality: Right;   SPHINCTEROTOMY  02/03/2022   Procedure: SPHINCTEROTOMY;  Surgeon: Jeani Hawking, MD;  Location: [facility] ENDOSCOPY;  Service: Gastroenterology;;   STUMP REVISION Right 06/13/2022   Procedure: REVISION RIGHT BELOW KNEE AMPUTATION;  Surgeon: Nadara Mustard, MD;  Location: [facility] OR;  Service: Orthopedics;  Laterality: Right;   TEE WITHOUT CARDIOVERSION N/A 02/04/2022   Procedure: TRANSESOPHAGEAL ECHOCARDIOGRAM (TEE);  Surgeon: Dolores Patty, MD;  Location: [facility] ENDOSCOPY;  Service: Cardiovascular;  Laterality: N/A;   THROMBECTOMY W/ EMBOLECTOMY Left 08/20/2022   Procedure: THROMBECTOMY OF LEFT ARM ARTERIOVENOUS FISTULA;  Surgeon: Nada Libman, MD;  Location: [facility] OR;  Service: Vascular;  Laterality: Left;   TOE AMPUTATION Right 02/24/2018   GREAT TOE AND 2ND TOE AMPUTATION   TUBAL LIGATION  1970's   Social History   Occupational History    Employer: UNEMPLOYED  Tobacco Use   Smoking status: Former    Packs/day: 3.00    Years: 32.00    Additional pack years: 0.00    Total pack years: 96.00    Types: Cigarettes    Quit date: 10/24/1997    Years since quitting: 25.0   Smokeless tobacco: Never  Vaping Use   Vaping Use: Never used  Substance and Sexual Activity   Alcohol use: Not Currently    Comment: "might have 2-3 daiquiris in the summer"   Drug use: No   Sexual activity: Not Currently    Birth control/protection: Surgical    Comment: Hysterectomy

## 2022-10-28 NOTE — Telephone Encounter (Signed)
Mr. Wehner brought a form from [facility] [employer]) - Physician's Certification - requiring completion and provider's signature. Form completion fee of $10 paid in full.  Paperwork placed on desk of provider's nurse.  Mr. Genco is requesting for the paperwork to be faxed to the number on the form [phone]), then to be called to pick up the original after the confirmation sheet is received.  Please advise at [phone], or on the land line.

## 2022-10-29 ENCOUNTER — Ambulatory Visit (HOSPITAL_COMMUNITY)
Admission: RE | Admit: 2022-10-29 | Discharge: 2022-10-29 | Disposition: A | Discharge status: Home / Self Care | Payer: PPO | Source: Ambulatory Visit | Attending: Vascular Surgery | Admitting: Vascular Surgery

## 2022-10-29 ENCOUNTER — Other Ambulatory Visit (HOSPITAL_COMMUNITY): Payer: PPO

## 2022-10-29 ENCOUNTER — Other Ambulatory Visit: Payer: Self-pay

## 2022-10-29 ENCOUNTER — Encounter (HOSPITAL_COMMUNITY)
Admission: RE | Disposition: A | Discharge status: Home / Self Care | Payer: Self-pay | Source: Ambulatory Visit | Attending: Vascular Surgery

## 2022-10-29 DIAGNOSIS — Z539 Procedure and treatment not carried out, unspecified reason: Secondary | ICD-10-CM | POA: Insufficient documentation

## 2022-10-29 DIAGNOSIS — L89150 Pressure ulcer of sacral region, unstageable: Secondary | ICD-10-CM | POA: Diagnosis not present

## 2022-10-29 DIAGNOSIS — T82590A Other mechanical complication of surgically created arteriovenous fistula, initial encounter: Secondary | ICD-10-CM

## 2022-10-29 DIAGNOSIS — L89320 Pressure ulcer of left buttock, unstageable: Secondary | ICD-10-CM | POA: Diagnosis not present

## 2022-10-29 LAB — POCT I-STAT, CHEM 8
BUN: 33 mg/dL — ABNORMAL HIGH (ref 8–23)
Calcium, Ion: 1.17 mmol/L (ref 1.15–1.40)
Chloride: 94 mmol/L — ABNORMAL LOW (ref 98–111)
Creatinine, Ser: 3.7 mg/dL — ABNORMAL HIGH (ref 0.44–1.00)
Glucose, Bld: 137 mg/dL — ABNORMAL HIGH (ref 70–99)
HCT: 34 % — ABNORMAL LOW (ref 36.0–46.0)
Hemoglobin: 11.6 g/dL — ABNORMAL LOW (ref 12.0–15.0)
Potassium: 3.5 mmol/L (ref 3.5–5.1)
Sodium: 137 mmol/L (ref 135–145)
TCO2: 35 mmol/L — ABNORMAL HIGH (ref 22–32)

## 2022-10-29 SURGERY — A/V FISTULAGRAM
Anesthesia: LOCAL | Laterality: Left

## 2022-10-29 MED ORDER — SODIUM CHLORIDE 0.9 % IV SOLN
250.0000 mL | INTRAVENOUS | Status: DC | PRN
Start: 2022-10-29 — End: 2022-12-12

## 2022-10-29 MED ORDER — SODIUM CHLORIDE 0.9% FLUSH: 3.0000 mL | INTRAVENOUS | Status: DC | PRN

## 2022-10-29 MED ORDER — SODIUM CHLORIDE 0.9% FLUSH: 3.0000 mL | Freq: Two times a day (BID) | INTRAVENOUS | Status: DC

## 2022-11-03 ENCOUNTER — Ambulatory Visit (INDEPENDENT_AMBULATORY_CARE_PROVIDER_SITE_OTHER): Payer: PPO | Admitting: Orthopedic Surgery

## 2022-11-03 DIAGNOSIS — I87332 Chronic venous hypertension (idiopathic) with ulcer and inflammation of left lower extremity: Secondary | ICD-10-CM | POA: Diagnosis not present

## 2022-11-04 ENCOUNTER — Encounter: Payer: Self-pay | Admitting: Orthopedic Surgery

## 2022-11-04 DIAGNOSIS — Z992 Dependence on renal dialysis: Secondary | ICD-10-CM | POA: Diagnosis not present

## 2022-11-04 DIAGNOSIS — D689 Coagulation defect, unspecified: Secondary | ICD-10-CM | POA: Diagnosis not present

## 2022-11-04 DIAGNOSIS — N186 End stage renal disease: Secondary | ICD-10-CM | POA: Diagnosis not present

## 2022-11-04 DIAGNOSIS — N2581 Secondary hyperparathyroidism of renal origin: Secondary | ICD-10-CM | POA: Diagnosis not present

## 2022-11-04 NOTE — Progress Notes (Signed)
Office Visit Note   Patient: Susan [name]           Date of Birth: 12/30/1950           MRN: [id number] Visit Date: 11/03/2022              Requested by: Donita Brooks, MD [address] [place],  [place] [address] PCP: Donita Brooks, MD  Chief Complaint  Patient presents with   Left Leg - Follow-up      HPI: Patient is a 72 year old woman with venous insufficiency.  Patient has undergone serial compression wraps.  She has a persistent necrotic traumatic venous ulcer of the left lower extremity.  There is a loose black eschar at this time.  Assessment & Plan: Visit Diagnoses:  1. 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  Follow-Up Instructions: Return in about 1 week (around 11/10/2022).   Ortho Exam  Patient is alert, oriented, no adenopathy, well-dressed, normal affect, normal respiratory effort. Examination the wound bed is stabilizing with the compression wrap.  The black eschar is loose.  A 10 blade knife was used to excise the black eschar back to bleeding viable tissue.  The wound healing has stalled, the wound bed has healthy granulation tissue, and patient presents for evaluation and application of Kerecis MariGen Micro graft. After informed consent a 10 blade knife was used to debride the skin and soft tissue to healthy viable bleeding granulation tissue.  Silver nitrate was used for hemostasis. The wound measures: 4 cm in length, 4 cm  in width, 1 mm in depth, wound location lateral left leg Kerecis MariGen micro tissue graft 8 cm2 was applied, and there was no wastage.  Please see the photo below of the Lot number and expiration date. The micro tissue graft was covered with a nonadherent Adaptic dressing, bolstered with 4 x 4 gauze and secured with a compression wrap.     Imaging: No results  found.    Labs: Lab Results  Component Value Date   HGBA1C 7.9 (H) 08/27/2022   HGBA1C 8.9 (H) 05/02/2022   HGBA1C >15.5 (H) 01/20/2022   CRP 0.6 09/01/2022   CRP 0.7 08/22/2020   CRP 1.5 (H) 03/13/2020   LABURIC 4.7 08/09/2020   LABURIC 9.1 (H) 10/12/2018   LABURIC 9.0 (H) 03/06/2018   REPTSTATUS 08/30/2022 FINAL 08/29/2022   GRAMSTAIN  05/05/2022    FEW WBC PRESENT, PREDOMINANTLY PMN NO ORGANISMS SEEN    CULT MULTIPLE SPECIES PRESENT, SUGGEST RECOLLECTION (A) 08/29/2022   LABORGA SERRATIA MARCESCENS 05/05/2022     Lab Results  Component Value Date   ALBUMIN 2.7 (L) 10/08/2022   ALBUMIN 2.9 (L) 09/08/2022   ALBUMIN 2.2 (L) 09/05/2022   PREALBUMIN 13 (L) 05/02/2022    Lab Results  Component Value Date   MG 1.9 09/04/2022   MG 1.8 09/03/2022   MG 1.6 (L) 09/02/2022   Lab Results  Component Value Date   VD25OH 42.14 05/02/2022    Lab Results  Component Value Date   PREALBUMIN 13 (L) 05/02/2022      Latest Ref Rng & Units 10/29/2022    9:39 AM 09/08/2022    9:46 AM 09/05/2022   12:51 PM  CBC EXTENDED  WBC 4.0 - 10.5 K/uL  5.2  4.5   RBC 3.87 - 5.11  MIL/uL  4.79  3.89   Hemoglobin 12.0 - 15.0 g/dL 16.1  09.6  9.1   HCT 04.5 - 46.0 % 34.0  38.4  31.2   Platelets 150 - 400 K/uL  228  176   NEUT# 1.7 - 7.7 K/uL  4.6    Lymph# 0.7 - 4.0 K/uL  0.3       There is no height or weight on file to calculate BMI.  Orders:  No orders of the defined types were placed in this encounter.  No orders of the defined types were placed in this encounter.    Procedures: No procedures performed  Clinical Data: No additional findings.  ROS:  All other systems negative, except as noted in the HPI. Review of Systems  Objective: Vital Signs: There were no vitals taken for this visit.  Specialty Comments:  No specialty comments available.  PMFS History: Patient Active Problem List   Diagnosis Date Noted   Secondary hypercoagulable state (HCC) 09/04/2022    Right sided abdominal pain 08/31/2022   Constipation 06/07/2022   History of Clostridioides difficile colitis 06/06/2022   Below-knee amputation of right lower extremity (HCC) 06/06/2022   Diverticulitis 06/05/2022   Stercoral colitis 06/05/2022   C. 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) 04/07/2022   Allergy, unspecified, initial encounter 04/07/2022   Dependence on renal dialysis (HCC) 04/07/2022   Gout due to renal impairment, unspecified site 04/07/2022   Hypertensive heart and chronic kidney disease with heart failure and with stage 5 chronic kidney disease, or end stage renal disease (HCC) 04/07/2022   Personal history of transient ischemic attack (TIA), and cerebral infarction without residual deficits 04/07/2022   Renal osteodystrophy 04/07/2022   Venous stasis ulcer of right calf (HCC) 03/31/2022   Fistula, colovaginal 03/26/2022   Diarrhea 03/26/2022   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   Sepsis without acute organ dysfunction (HCC)    Bacteremia    Acute pancreatitis 02/01/2022   Abdominal pain 02/01/2022   SIRS (systemic inflammatory response syndrome) (HCC) 02/01/2022   Transaminitis 02/01/2022   History of anemia due to chronic kidney disease 02/01/2022   Paroxysmal atrial fibrillation (HCC) 02/01/2022   DKA (diabetic ketoacidosis) (HCC) 01/14/2022   NSTEMI (non-ST elevated myocardial infarction) (HCC) 03/05/2021   Acute  renal failure superimposed on stage 4 chronic kidney disease (HCC) 08/22/2020   Hypoalbuminemia 05/25/2020   GERD (gastroesophageal reflux disease) 05/25/2020   Pressure injury of skin 05/17/2020   Acute on chronic combined systolic and diastolic congestive heart failure (HCC) 03/07/2020   Type 2 diabetes mellitus with diabetic polyneuropathy, with long-term current use of insulin (HCC) 03/07/2020   Obesity, Class III, BMI 40-49.9 (morbid obesity) (HCC) 03/07/2020   Common bile duct (CBD) obstruction 05/28/2019   Benign neoplasm of ascending colon    Benign neoplasm of transverse colon    Benign neoplasm of descending colon    Benign neoplasm of sigmoid colon    Gastric polyps    Hyperkalemia 03/11/2019   Prolonged QT  interval 03/11/2019   Acute blood loss anemia 03/11/2019   Onychomycosis 06/21/2018   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)    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 (HCC)    PVD (peripheral vascular disease) (HCC) 10/26/2017   E-coli UTI 07/27/2017   Hypothyroidism 07/27/2017   AKI (acute kidney injury) (HCC)    PAH (pulmonary artery hypertension) (HCC)    Impaired ambulation 07/19/2017   Nausea & vomiting 07/15/2017   Leg cramps 02/27/2017   Peripheral edema 01/12/2017   Diabetic neuropathy (HCC) 11/12/2016   CKD (chronic kidney disease), stage IV (HCC) 10/24/2015   Anemia of chronic disease 10/03/2015   Generalized anxiety disorder 10/03/2015   Insomnia 10/03/2015   Hyperglycemia due to diabetes mellitus (HCC) 06/07/2015   Chronic diastolic CHF (congestive heart failure) (HCC) 06/07/2015   Non compliance with medical treatment 04/17/2014   Rotator cuff tear 03/14/2014   Class 3 obesity (HCC) 09/23/2013   Chronic HFrEF (heart failure with reduced ejection fraction) (HCC) 06/03/2013   Hypotension 12/25/2012   Urinary incontinence    MDD (major depressive disorder) 11/12/2010   RBBB (right bundle branch block)    Wide-complex  tachycardia    Coronary artery disease    Hyperlipemia 01/22/2009   Essential hypertension 01/22/2009   Past Medical History:  Diagnosis Date   Acute MI (HCC) 1999; 2007; 03/05/21   Anemia    hx   Anginal pain (HCC)    Anxiety    ARF (acute renal failure) (HCC) 06/2017   [facility]   Arthritis    "generalized" (03/15/2014)   CAD (coronary artery disease)    MI in 2000 - MI  2007 - treated bare metal stent (no nuclear since then as 9/11)   Carotid artery disease (HCC)    CHF (congestive heart failure) (HCC)    HFrEF 03/06/21   Chronic diastolic heart failure (HCC)    a) ECHO (08/2013) EF 55-60% and RV function nl b) RHC (08/2013) RA 4, RV 30/5/7, PA 25/10 (16), PCWP 7, Fick CO/CI 6.3/2.7, PVR 1.5 WU, PA 61 and 66%   Daily headache    "~ every other day; since I fell in June" (03/15/2014)   Depression    Dyslipidemia    Dyspnea    uses oxygen qhs via [place]   ESRD (end stage renal disease) (HCC)    Dialysis on Tues Thurs Sat   Exertional shortness of breath    History of kidney stones    HTN (hypertension)    Hypothyroidism    Neuropathy    Obesity    Osteoarthritis    PAF (paroxysmal atrial fibrillation) (HCC)    Peripheral neuropathy    bilateral feet/hands   PONV (postoperative nausea and vomiting)    RBBB (right bundle branch block)    Old   Stroke (HCC)    mini strokes   Syncope    likely due to low blood sugar   Tachycardia    Sinus tachycardia   Type II diabetes mellitus (HCC)    Type II, [name]. patient has omnipod insulin pump with Novolin R Insulin   Urinary incontinence    Venous insufficiency     Family History  Problem Relation Age of Onset   Heart attack Mother 56    Past Surgical History:  Procedure Laterality Date   ABDOMINAL HYSTERECTOMY  1980's   AMPUTATION Right 02/24/2018   Procedure: RIGHT FOOT GREAT TOE AND 2ND TOE AMPUTATION;  Surgeon: Nadara Mustard, MD;  Location:  [facility] OR;  Service: Orthopedics;  Laterality:  Right;   AMPUTATION Right 04/30/2018   Procedure: RIGHT TRANSMETATARSAL AMPUTATION;  Surgeon: Nadara Mustard, MD;  Location: [name] OR;  Service: Orthopedics;  Laterality: Right;   AMPUTATION Right 05/02/2022   Procedure: RIGHT BELOW KNEE AMPUTATION;  Surgeon: Nadara Mustard, MD;  Location: [facility] OR;  Service: Orthopedics;  Laterality: Right;   APPLICATION OF WOUND VAC Right 06/13/2022   Procedure: APPLICATION OF WOUND VAC;  Surgeon: Nadara Mustard, MD;  Location: [facility] OR;  Service: Orthopedics;  Laterality: Right;   AV FISTULA PLACEMENT Left 04/02/2022   Procedure: LEFT ARM ARTERIOVENOUS (AV) FISTULA CREATION;  Surgeon: Nada Libman, MD;  Location: [facility] OR;  Service: Vascular;  Laterality: Left;  PERIPHERAL NERVE BLOCK   BASCILIC VEIN TRANSPOSITION Left 07/31/2022   Procedure: LEFT ARM SECOND STAGE BASILIC VEIN TRANSPOSITION;  Surgeon: Nada Libman, MD;  Location: [facility] OR;  Service: Vascular;  Laterality: Left;   BIOPSY  05/27/2020   Procedure: BIOPSY;  Surgeon: Lanelle Bal, DO;  Location: [facility]O SUITE;  Service: Endoscopy;;   CATARACT EXTRACTION, BILATERAL Bilateral ?2013   COLONOSCOPY W/ POLYPECTOMY     COLONOSCOPY WITH PROPOFOL N/A 03/13/2019   Procedure: COLONOSCOPY WITH PROPOFOL;  Surgeon: Beverley Fiedler, MD;  Location: [facility] ENDOSCOPY;  Service: Gastroenterology;  Laterality: N/A;   CORONARY ANGIOPLASTY WITH STENT PLACEMENT  1999; 2007   "1 + 1"   ERCP N/A 02/03/2022   Procedure: ENDOSCOPIC RETROGRADE CHOLANGIOPANCREATOGRAPHY (ERCP);  Surgeon: Jeani Hawking, MD;  Location: [facility] ENDOSCOPY;  Service: Gastroenterology;  Laterality: N/A;   ESOPHAGOGASTRODUODENOSCOPY (EGD) WITH PROPOFOL N/A 03/13/2019   Procedure: ESOPHAGOGASTRODUODENOSCOPY (EGD) WITH PROPOFOL;  Surgeon: Beverley Fiedler, MD;  Location: [facility] ENDOSCOPY;  Service: Gastroenterology;  Laterality: N/A;   ESOPHAGOGASTRODUODENOSCOPY (EGD) WITH PROPOFOL N/A 05/27/2020   Procedure: ESOPHAGOGASTRODUODENOSCOPY (EGD) WITH PROPOFOL;  Surgeon: Lanelle Bal, DO;  [facility];  Service: Endoscopy;  Laterality: N/A;   ESOPHAGOGASTRODUODENOSCOPY (EGD) WITH PROPOFOL N/A 09/03/2022   Procedure: ESOPHAGOGASTRODUODENOSCOPY (EGD) WITH PROPOFOL;  Surgeon: Jeani Hawking, MD;  Location: [facility] ENDOSCOPY;  Service: Gastroenterology;  Laterality: N/A;   EYE SURGERY Bilateral    lazer   FLEXIBLE SIGMOIDOSCOPY N/A 05/23/2022   Procedure: FLEXIBLE SIGMOIDOSCOPY;  Surgeon: Jeani Hawking, MD;  Location: [facility] ENDOSCOPY;  Service: Gastroenterology;  Laterality: N/A;   FLEXIBLE SIGMOIDOSCOPY N/A 05/24/2022   Procedure: FLEXIBLE SIGMOIDOSCOPY;  Surgeon: Imogene Burn, MD;  Location: [facility] ENDOSCOPY;  Service: Gastroenterology;  Laterality: N/A;   FLEXIBLE SIGMOIDOSCOPY N/A 09/03/2022   Procedure: FLEXIBLE SIGMOIDOSCOPY;  Surgeon: Jeani Hawking, MD;  Location: [facility] ENDOSCOPY;  Service: Gastroenterology;  Laterality: N/A;   HEMOSTASIS CLIP PLACEMENT  03/13/2019   Procedure: HEMOSTASIS CLIP PLACEMENT;  Surgeon: Beverley Fiedler, MD;  Location: [facility] ENDOSCOPY;  Service: Gastroenterology;;   HEMOSTASIS CLIP PLACEMENT  05/23/2022   Procedure: HEMOSTASIS CLIP PLACEMENT;  Surgeon: Jeani Hawking, MD;  Location: [facility] ENDOSCOPY;  Service: Gastroenterology;;   HEMOSTASIS CONTROL  05/24/2022   Procedure: HEMOSTASIS CONTROL;  Surgeon: Imogene Burn, MD;  Location: [facility] ENDOSCOPY;  Service: Gastroenterology;;   HOT HEMOSTASIS N/A 05/23/2022   Procedure: HOT HEMOSTASIS (ARGON PLASMA COAGULATION/BICAP);  Surgeon: Jeani Hawking, MD;  Location: [facility] ENDOSCOPY;  Service: Gastroenterology;  Laterality: N/A;   I & D EXTREMITY Left 05/05/2022   Procedure: IRRIGATION AND DEBRIDEMENT LEFT ARM AV FISTULA;  Surgeon: Cephus Shelling, MD;  Location: [facility] OR;  Service: Vascular;  Laterality: Left;  INSERTION OF DIALYSIS CATHETER Right 04/02/2022   Procedure: INSERTION OF TUNNELED DIALYSIS CATHETER;  Surgeon: Nada Libman, MD;  Location: [facility] OR;  Service: Vascular;  Laterality: Right;   KNEE  ARTHROSCOPY Left 10/25/2006   POLYPECTOMY  03/13/2019   Procedure: POLYPECTOMY;  Surgeon: Beverley Fiedler, MD;  Location: [facility] ENDOSCOPY;  Service: Gastroenterology;;   REMOVAL OF STONES  02/03/2022   Procedure: REMOVAL OF STONES;  Surgeon: Jeani Hawking, MD;  Location: [facility] ENDOSCOPY;  Service: Gastroenterology;;   REVISON OF ARTERIOVENOUS FISTULA Left 08/20/2022   Procedure: REVISON OF LEFT ARM ARTERIOVENOUS FISTULA;  Surgeon: Nada Libman, MD;  Location: [facility] OR;  Service: Vascular;  Laterality: Left;   RIGHT HEART CATH N/A 07/24/2017   Procedure: RIGHT HEART CATH;  Surgeon: Dolores Patty, MD;  Location: [facility] INVASIVE CV LAB;  Service: Cardiovascular;  Laterality: N/A;   RIGHT HEART CATHETERIZATION N/A 09/22/2013   Procedure: RIGHT HEART CATH;  Surgeon: Dolores Patty, MD;  Location: [facility] CATH LAB;  Service: Cardiovascular;  Laterality: N/A;   SHOULDER ARTHROSCOPY WITH OPEN ROTATOR CUFF REPAIR Right 03/14/2014   Procedure: RIGHT SHOULDER ARTHROSCOPY WITH BICEPS RELEASE, OPEN SUBSCAPULA REPAIR, OPEN SUPRASPINATUS REPAIR.;  Surgeon: Cammy Copa, MD;  Location: [facility] OR;  Service: Orthopedics;  Laterality: Right;   SPHINCTEROTOMY  02/03/2022   Procedure: SPHINCTEROTOMY;  Surgeon: Jeani Hawking, MD;  Location: [facility] ENDOSCOPY;  Service: Gastroenterology;;   STUMP REVISION Right 06/13/2022   Procedure: REVISION RIGHT BELOW KNEE AMPUTATION;  Surgeon: Nadara Mustard, MD;  Location: [facility] OR;  Service: Orthopedics;  Laterality: Right;   TEE WITHOUT CARDIOVERSION N/A 02/04/2022   Procedure: TRANSESOPHAGEAL ECHOCARDIOGRAM (TEE);  Surgeon: Dolores Patty, MD;  Location: [facility] ENDOSCOPY;  Service: Cardiovascular;  Laterality: N/A;   THROMBECTOMY W/ EMBOLECTOMY Left 08/20/2022   Procedure: THROMBECTOMY OF LEFT ARM ARTERIOVENOUS FISTULA;  Surgeon: Nada Libman, MD;  Location: [facility] OR;  Service: Vascular;  Laterality: Left;   TOE AMPUTATION Right 02/24/2018   GREAT TOE AND 2ND TOE AMPUTATION   TUBAL LIGATION   1970's   Social History   Occupational History    Employer: UNEMPLOYED  Tobacco Use   Smoking status: Former    Packs/day: 3.00    Years: 32.00    Additional pack years: 0.00    Total pack years: 96.00    Types: Cigarettes    Quit date: 10/24/1997    Years since quitting: 25.0   Smokeless tobacco: Never  Vaping Use   Vaping Use: Never used  Substance and Sexual Activity   Alcohol use: Not Currently    Comment: "might have 2-3 daiquiris in the summer"   Drug use: No   Sexual activity: Not Currently    Birth control/protection: Surgical    Comment: Hysterectomy

## 2022-11-04 NOTE — Telephone Encounter (Signed)
Patient's husband returned to the office today with a copy of a letter he received from [employer], [employer], stating the [facility] paperwork is incomplete.   They are requesting the following:  - Part A - Medical Facts: Question 5 - Part B - Treatment Needed and Schedule: Question C - Part B - Treatment Needed and Schedule: Question D  An additional Certification of Healthcare Provider form was provided. Form placed on nurse's desk.   Please send to them when completed via fax at [phone], and call patient at [phone] to pick up originals.

## 2022-11-05 ENCOUNTER — Other Ambulatory Visit (HOSPITAL_COMMUNITY): Payer: PPO

## 2022-11-06 DIAGNOSIS — L89320 Pressure ulcer of left buttock, unstageable: Secondary | ICD-10-CM | POA: Diagnosis not present

## 2022-11-06 DIAGNOSIS — L89150 Pressure ulcer of sacral region, unstageable: Secondary | ICD-10-CM | POA: Diagnosis not present

## 2022-11-07 ENCOUNTER — Ambulatory Visit (HOSPITAL_BASED_OUTPATIENT_CLINIC_OR_DEPARTMENT_OTHER)
Admission: RE | Admit: 2022-11-07 | Discharge: 2022-11-07 | Disposition: A | Discharge status: Home / Self Care | Payer: PPO | Source: Ambulatory Visit | Attending: Vascular Surgery | Admitting: Vascular Surgery

## 2022-11-07 ENCOUNTER — Ambulatory Visit (HOSPITAL_COMMUNITY)
Admission: RE | Disposition: A | Discharge status: Home / Self Care | Payer: Self-pay | Source: Ambulatory Visit | Attending: Vascular Surgery

## 2022-11-07 ENCOUNTER — Other Ambulatory Visit (HOSPITAL_COMMUNITY): Payer: PPO

## 2022-11-07 DIAGNOSIS — I5042 Chronic combined systolic (congestive) and diastolic (congestive) heart failure: Secondary | ICD-10-CM | POA: Insufficient documentation

## 2022-11-07 DIAGNOSIS — L03116 Cellulitis of left lower limb: Secondary | ICD-10-CM | POA: Diagnosis not present

## 2022-11-07 DIAGNOSIS — E1122 Type 2 diabetes mellitus with diabetic chronic kidney disease: Secondary | ICD-10-CM | POA: Insufficient documentation

## 2022-11-07 DIAGNOSIS — E11628 Type 2 diabetes mellitus with other skin complications: Secondary | ICD-10-CM | POA: Diagnosis not present

## 2022-11-07 DIAGNOSIS — Z992 Dependence on renal dialysis: Secondary | ICD-10-CM

## 2022-11-07 DIAGNOSIS — Y841 Kidney dialysis as the cause of abnormal reaction of the patient, or of later complication, without mention of misadventure at the time of the procedure: Secondary | ICD-10-CM | POA: Insufficient documentation

## 2022-11-07 DIAGNOSIS — L089 Local infection of the skin and subcutaneous tissue, unspecified: Secondary | ICD-10-CM | POA: Diagnosis not present

## 2022-11-07 DIAGNOSIS — I132 Hypertensive heart and chronic kidney disease with heart failure and with stage 5 chronic kidney disease, or end stage renal disease: Secondary | ICD-10-CM | POA: Insufficient documentation

## 2022-11-07 DIAGNOSIS — E11622 Type 2 diabetes mellitus with other skin ulcer: Secondary | ICD-10-CM | POA: Diagnosis not present

## 2022-11-07 DIAGNOSIS — N186 End stage renal disease: Secondary | ICD-10-CM | POA: Diagnosis not present

## 2022-11-07 DIAGNOSIS — T82868A Thrombosis of vascular prosthetic devices, implants and grafts, initial encounter: Secondary | ICD-10-CM

## 2022-11-07 DIAGNOSIS — Z87891 Personal history of nicotine dependence: Secondary | ICD-10-CM | POA: Insufficient documentation

## 2022-11-07 DIAGNOSIS — I10 Essential (primary) hypertension: Secondary | ICD-10-CM | POA: Diagnosis not present

## 2022-11-07 DIAGNOSIS — T82590A Other mechanical complication of surgically created arteriovenous fistula, initial encounter: Secondary | ICD-10-CM

## 2022-11-07 HISTORY — PX: A/V FISTULAGRAM: CATH118298

## 2022-11-07 LAB — POCT I-STAT, CHEM 8
BUN: 40 mg/dL — ABNORMAL HIGH (ref 8–23)
Calcium, Ion: 1.24 mmol/L (ref 1.15–1.40)
Chloride: 93 mmol/L — ABNORMAL LOW (ref 98–111)
Creatinine, Ser: 3.8 mg/dL — ABNORMAL HIGH (ref 0.44–1.00)
Glucose, Bld: 195 mg/dL — ABNORMAL HIGH (ref 70–99)
HCT: 35 % — ABNORMAL LOW (ref 36.0–46.0)
Hemoglobin: 11.9 g/dL — ABNORMAL LOW (ref 12.0–15.0)
Potassium: 3.3 mmol/L — ABNORMAL LOW (ref 3.5–5.1)
Sodium: 135 mmol/L (ref 135–145)
TCO2: 33 mmol/L — ABNORMAL HIGH (ref 22–32)

## 2022-11-07 LAB — GLUCOSE, CAPILLARY: Glucose-Capillary: 139 mg/dL — ABNORMAL HIGH (ref 70–99)

## 2022-11-07 SURGERY — A/V FISTULAGRAM
Anesthesia: LOCAL | Laterality: Left

## 2022-11-07 MED ORDER — HEPARIN (PORCINE) IN NACL 1000-0.9 UT/500ML-% IV SOLN
INTRAVENOUS | Status: DC | PRN
  Administered 2022-11-07: 500 mL

## 2022-11-07 MED ORDER — SODIUM CHLORIDE 0.9% FLUSH: 3.0000 mL | INTRAVENOUS | Status: DC | PRN

## 2022-11-07 MED ORDER — LIDOCAINE HCL (PF) 1 % IJ SOLN
INTRAMUSCULAR | Status: AC
  Filled 2022-11-07: qty 30

## 2022-11-07 SURGICAL SUPPLY — 9 items
BAG SNAP BAND KOVER 36X36 (MISCELLANEOUS) ×1 IMPLANT
COVER DOME SNAP 22 D (MISCELLANEOUS) ×1 IMPLANT
KIT MICROPUNCTURE NIT STIFF (SHEATH) IMPLANT
PROTECTION STATION PRESSURIZED (MISCELLANEOUS) ×1
SHEATH PROBE COVER 6X72 (BAG) ×1 IMPLANT
STATION PROTECTION PRESSURIZED (MISCELLANEOUS) ×1 IMPLANT
STOPCOCK MORSE 400PSI 3WAY (MISCELLANEOUS) ×1 IMPLANT
TRAY PV CATH (CUSTOM PROCEDURE TRAY) ×1 IMPLANT
TUBING CIL FLEX 10 FLL-RA (TUBING) ×1 IMPLANT

## 2022-11-07 NOTE — Op Note (Signed)
DATE OF SERVICE: 11/07/2022  PATIENT:  Susan [name]  72 y.o. female  PRE-OPERATIVE DIAGNOSIS:  ESRD, malfunctioning fistula  POST-OPERATIVE DIAGNOSIS:  ESRD; thrombosed fistula  PROCEDURE:   1) left arm fistulagram 2) left arm angiogram  SURGEON:  Surgeon(s) and Role:    * Leonie Douglas, MD - Primary  ASSISTANT: none  ANESTHESIA:   local  EBL: minimal  BLOOD ADMINISTERED:none  DRAINS: none   LOCAL MEDICATIONS USED:  LIDOCAINE   SPECIMEN:  none  COUNTS: confirmed correct.  TOURNIQUET:  none  PATIENT DISPOSITION:  PACU - hemodynamically stable.   Delay start of Pharmacological VTE agent (>24hrs) due to surgical blood loss or risk of bleeding: no  INDICATION FOR PROCEDURE: Susan [name] is a 72 y.o. female with ESRD; non-functional left arm fistula. After careful discussion of risks, benefits, and alternatives the patient was offered fistulagram. The patient understood and wished to proceed.  OPERATIVE FINDINGS: Thrombosed fistula. Not salvageable. Left upper extremity angiogram normal.  DESCRIPTION OF PROCEDURE: After identification of the patient in the pre-operative holding area, the patient was transferred to the operating room. The patient was positioned supine on the operating room table. Anesthesia was induced. The left arm was prepped and draped in standard fashion. A surgical pause was performed confirming correct patient, procedure, and operative location.  The open portion of the fistula was cannulated with micropuncture technique.  Fistulogram was performed.  This showed a completely thrombosed fistula and a normal arterial tree.  No options for salvage of the fistula were available.  All access was removed and manual pressure held.  A bandage was applied.  Upon completion of the case instrument and sharps counts were confirmed correct. The patient was transferred to the PACU in good condition. I was present for all portions of the procedure.  FOLLOW UP  PLAN: Patient will need to follow-up with one of the previous surgeons in the next 4 weeks with vein mapping to discuss during dialysis access.  Rande Brunt. Lenell Antu, MD [facility] Phone Number: [phone] 11/07/2022 11:33 AM

## 2022-11-07 NOTE — H&P (Signed)
[facility] [place]  ASSESSMENT / PLAN: 72 y.o. female with ESRD, malfunctioning left arm arteriovenous fistula. Fistulagram was requested to better evaluate.  CHIEF COMPLAINT: non-maturing fistula  HISTORY OF PRESENT ILLNESS: Susan [name] is a 72 y.o. female with non-functional left arm arteriovenous fistula. A fistulagram was requested by her dialysis center because they could not feel a thrill. I could not feel a thrill today in the cath lab. I could feel a pulsatile "thump" in the proximal fistula. I offered a fistulagram to see if this could be salvaged.   Past Medical History:  Diagnosis Date   Acute MI (HCC) 1999; 2007; 03/05/21   Anemia    hx   Anginal pain (HCC)    Anxiety    ARF (acute renal failure) (HCC) 06/2017   [facility]   Arthritis    "generalized" (03/15/2014)   CAD (coronary artery disease)    MI in 2000 - MI  2007 - treated bare metal stent (no nuclear since then as 9/11)   Carotid artery disease (HCC)    CHF (congestive heart failure) (HCC)    HFrEF 03/06/21   Chronic diastolic heart failure (HCC)    a) ECHO (08/2013) EF 55-60% and RV function nl b) RHC (08/2013) RA 4, RV 30/5/7, PA 25/10 (16), PCWP 7, Fick CO/CI 6.3/2.7, PVR 1.5 WU, PA 61 and 66%   Daily headache    "~ every other day; since I fell in June" (03/15/2014)   Depression    Dyslipidemia    Dyspnea    uses oxygen qhs via [place]   ESRD (end stage renal disease) (HCC)    Dialysis on Tues Thurs Sat   Exertional shortness of breath    History of kidney stones    HTN (hypertension)    Hypothyroidism    Neuropathy    Obesity    Osteoarthritis    PAF (paroxysmal atrial fibrillation) (HCC)    Peripheral neuropathy    bilateral feet/hands   PONV (postoperative nausea and vomiting)    RBBB (right bundle branch block)    Old   Stroke (HCC)    mini strokes   Syncope    likely due to low blood sugar   Tachycardia    Sinus tachycardia   Type II diabetes mellitus  (HCC)    Type II, [name]. patient has omnipod insulin pump with Novolin R Insulin   Urinary incontinence    Venous insufficiency     Past Surgical History:  Procedure Laterality Date   ABDOMINAL HYSTERECTOMY  1980's   AMPUTATION Right 02/24/2018   Procedure: RIGHT FOOT GREAT TOE AND 2ND TOE AMPUTATION;  Surgeon: Nadara Mustard, MD;  [facility]: [facility];  Service: Orthopedics;  Laterality: Right;   AMPUTATION Right 04/30/2018   Procedure: RIGHT TRANSMETATARSAL AMPUTATION;  Surgeon: Nadara Mustard, MD;  Location: [facility] OR;  Service: Orthopedics;  Laterality: Right;   AMPUTATION Right 05/02/2022   Procedure: RIGHT BELOW KNEE AMPUTATION;  Surgeon: Nadara Mustard, MD;  Location: [facility] OR;  Service: Orthopedics;  Laterality: Right;   APPLICATION OF WOUND VAC Right 06/13/2022   Procedure: APPLICATION OF WOUND VAC;  Surgeon: Nadara Mustard, MD;  Location: [facility] OR;  Service: Orthopedics;  Laterality: Right;   AV FISTULA PLACEMENT Left 04/02/2022   Procedure: LEFT ARM ARTERIOVENOUS (AV) FISTULA CREATION;  Surgeon: Nada Libman, MD;  Location: [facility] OR;  Service: Vascular;  Laterality: Left;  PERIPHERAL NERVE BLOCK   BASCILIC  VEIN TRANSPOSITION Left 07/31/2022   Procedure: LEFT ARM SECOND STAGE BASILIC VEIN TRANSPOSITION;  Surgeon: Nada Libman, MD;  [facility]: [facility];  Service: Vascular;  Laterality: Left;   BIOPSY  05/27/2020   Procedure: BIOPSY;  Surgeon: Lanelle Bal, DO;  [facility]: [facility];  Service: Endoscopy;;   CATARACT EXTRACTION, BILATERAL Bilateral ?2013   COLONOSCOPY W/ POLYPECTOMY     COLONOSCOPY WITH PROPOFOL N/A 03/13/2019   Procedure: COLONOSCOPY WITH PROPOFOL;  Surgeon: Beverley Fiedler, MD;  Location: [facility] ENDOSCOPY;  Service: Gastroenterology;  Laterality: N/A;   CORONARY ANGIOPLASTY WITH STENT PLACEMENT  1999; 2007   "1 + 1"   ERCP N/A 02/03/2022   Procedure: ENDOSCOPIC RETROGRADE CHOLANGIOPANCREATOGRAPHY (ERCP);  Surgeon: Jeani Hawking, MD;  Location: [facility] ENDOSCOPY;   Service: Gastroenterology;  Laterality: N/A;   ESOPHAGOGASTRODUODENOSCOPY (EGD) WITH PROPOFOL N/A 03/13/2019   Procedure: ESOPHAGOGASTRODUODENOSCOPY (EGD) WITH PROPOFOL;  Surgeon: Beverley Fiedler, MD;  Location: [facility] ENDOSCOPY;  Service: Gastroenterology;  Laterality: N/A;   ESOPHAGOGASTRODUODENOSCOPY (EGD) WITH PROPOFOL N/A 05/27/2020   Procedure: ESOPHAGOGASTRODUODENOSCOPY (EGD) WITH PROPOFOL;  Surgeon: Lanelle Bal, DO;  [facility];  Service: Endoscopy;  Laterality: N/A;   ESOPHAGOGASTRODUODENOSCOPY (EGD) WITH PROPOFOL N/A 09/03/2022   Procedure: ESOPHAGOGASTRODUODENOSCOPY (EGD) WITH PROPOFOL;  Surgeon: Jeani Hawking, MD;  Location: [facility] ENDOSCOPY;  Service: Gastroenterology;  Laterality: N/A;   EYE SURGERY Bilateral    lazer   FLEXIBLE SIGMOIDOSCOPY N/A 05/23/2022   Procedure: FLEXIBLE SIGMOIDOSCOPY;  Surgeon: Jeani Hawking, MD;  Location: [facility] ENDOSCOPY;  Service: Gastroenterology;  Laterality: N/A;   FLEXIBLE SIGMOIDOSCOPY N/A 05/24/2022   Procedure: FLEXIBLE SIGMOIDOSCOPY;  Surgeon: Imogene Burn, MD;  Location: [facility] ENDOSCOPY;  Service: Gastroenterology;  Laterality: N/A;   FLEXIBLE SIGMOIDOSCOPY N/A 09/03/2022   Procedure: FLEXIBLE SIGMOIDOSCOPY;  Surgeon: Jeani Hawking, MD;  Location: [facility] ENDOSCOPY;  Service: Gastroenterology;  Laterality: N/A;   HEMOSTASIS CLIP PLACEMENT  03/13/2019   Procedure: HEMOSTASIS CLIP PLACEMENT;  Surgeon: Beverley Fiedler, MD;  Location: [facility] ENDOSCOPY;  Service: Gastroenterology;;   HEMOSTASIS CLIP PLACEMENT  05/23/2022   Procedure: HEMOSTASIS CLIP PLACEMENT;  Surgeon: Jeani Hawking, MD;  Location: [facility] ENDOSCOPY;  Service: Gastroenterology;;   HEMOSTASIS CONTROL  05/24/2022   Procedure: HEMOSTASIS CONTROL;  Surgeon: Imogene Burn, MD;  Location: [facility] ENDOSCOPY;  Service: Gastroenterology;;   HOT HEMOSTASIS N/A 05/23/2022   Procedure: HOT HEMOSTASIS (ARGON PLASMA COAGULATION/BICAP);  Surgeon: Jeani Hawking, MD;  Location: [facility] ENDOSCOPY;  Service:  Gastroenterology;  Laterality: N/A;   I & D EXTREMITY Left 05/05/2022   Procedure: IRRIGATION AND DEBRIDEMENT LEFT ARM AV FISTULA;  Surgeon: Cephus Shelling, MD;  [facility]: [facility];  Service: Vascular;  Laterality: Left;   INSERTION OF DIALYSIS CATHETER Right 04/02/2022   Procedure: INSERTION OF TUNNELED DIALYSIS CATHETER;  Surgeon: Nada Libman, MD;  Location: [facility] OR;  Service: Vascular;  Laterality: Right;   KNEE ARTHROSCOPY Left 10/25/2006   POLYPECTOMY  03/13/2019   Procedure: POLYPECTOMY;  Surgeon: Beverley Fiedler, MD;  Location: [facility] ENDOSCOPY;  Service: Gastroenterology;;   REMOVAL OF STONES  02/03/2022   Procedure: REMOVAL OF STONES;  Surgeon: Jeani Hawking, MD;  Location: [facility] ENDOSCOPY;  Service: Gastroenterology;;   REVISON OF ARTERIOVENOUS FISTULA Left 08/20/2022   Procedure: REVISON OF LEFT ARM ARTERIOVENOUS FISTULA;  Surgeon: Nada Libman, MD;  Location: [facility] OR;  Service: Vascular;  Laterality: Left;   RIGHT HEART CATH N/A 07/24/2017   Procedure: RIGHT HEART CATH;  Surgeon: Dolores Patty, MD;  Location: [facility] INVASIVE  CV LAB;  Service: Cardiovascular;  Laterality: N/A;   RIGHT HEART CATHETERIZATION N/A 09/22/2013   Procedure: RIGHT HEART CATH;  Surgeon: Dolores Patty, MD;  Location: [facility] CATH LAB;  Service: Cardiovascular;  Laterality: N/A;   SHOULDER ARTHROSCOPY WITH OPEN ROTATOR CUFF REPAIR Right 03/14/2014   Procedure: RIGHT SHOULDER ARTHROSCOPY WITH BICEPS RELEASE, OPEN SUBSCAPULA REPAIR, OPEN SUPRASPINATUS REPAIR.;  Surgeon: Cammy Copa, MD;  Location: [facility] OR;  Service: Orthopedics;  Laterality: Right;   SPHINCTEROTOMY  02/03/2022   Procedure: SPHINCTEROTOMY;  Surgeon: Jeani Hawking, MD;  Location: [facility] ENDOSCOPY;  Service: Gastroenterology;;   STUMP REVISION Right 06/13/2022   Procedure: REVISION RIGHT BELOW KNEE AMPUTATION;  Surgeon: Nadara Mustard, MD;  Location: [facility] OR;  Service: Orthopedics;  Laterality: Right;   TEE WITHOUT CARDIOVERSION N/A 02/04/2022   Procedure:  TRANSESOPHAGEAL ECHOCARDIOGRAM (TEE);  Surgeon: Dolores Patty, MD;  Location: [facility] ENDOSCOPY;  Service: Cardiovascular;  Laterality: N/A;   THROMBECTOMY W/ EMBOLECTOMY Left 08/20/2022   Procedure: THROMBECTOMY OF LEFT ARM ARTERIOVENOUS FISTULA;  Surgeon: Nada Libman, MD;  Location: [facility] OR;  Service: Vascular;  Laterality: Left;   TOE AMPUTATION Right 02/24/2018   GREAT TOE AND 2ND TOE AMPUTATION   TUBAL LIGATION  1970's    Family History  Problem Relation Age of Onset   Heart attack Mother 85    Social History   Socioeconomic History   Marital status: Married    Spouse name: Ramon Dredge   Number of children: 3   Years of education: 12th   Highest education level: Not on file  Occupational History    Employer: UNEMPLOYED  Tobacco Use   Smoking status: Former    Packs/day: 3.00    Years: 32.00    Additional pack years: 0.00    Total pack years: 96.00    Types: Cigarettes    Quit date: 10/24/1997    Years since quitting: 25.0   Smokeless tobacco: Never  Vaping Use   Vaping Use: Never used  Substance and Sexual Activity   Alcohol use: Not Currently    Comment: "might have 2-3 daiquiris in the summer"   Drug use: No   Sexual activity: Not Currently    Birth control/protection: Surgical    Comment: Hysterectomy  Other Topics Concern   Not on file  Social History Narrative   Pt lives at home with her spouse.Caffeine Use- 3 sodas daily.  Update:  now resides [facility]   Social Determinants of Health   Financial Resource Strain: Low Risk  (08/16/2021)   Overall Financial Resource Strain (CARDIA)    Difficulty of Paying Living Expenses: Not hard at all  Food Insecurity: No Food Insecurity (08/27/2022)   Hunger Vital Sign    Worried About Running Out of Food in the Last Year: Never true    Ran Out of Food in the Last Year: Never true  Transportation Needs: No Transportation Needs (08/27/2022)   PRAPARE - Administrator, Civil Service (Medical): No    Lack of  Transportation (Non-Medical): No  Physical Activity: Inactive (08/16/2021)   Exercise Vital Sign    Days of Exercise per Week: 0 days    Minutes of Exercise per Session: 0 min  Stress: No Stress Concern Present (08/16/2021)   [employer] of Occupational Health - Occupational Stress Questionnaire    Feeling of Stress : Not at all  Social Connections: Moderately Isolated (08/16/2021)   Social Connection and Isolation Panel [NHANES]    Frequency of  Communication with Friends and Family: More than three times a week    Frequency of Social Gatherings with Friends and Family: More than three times a week    Attends Religious Services: Never    Database administrator or Organizations: No    Attends Banker Meetings: Never    Marital Status: Married  Catering manager Violence: Not At Risk (08/27/2022)   Humiliation, Afraid, Rape, and Kick questionnaire    Fear of Current or Ex-Partner: No    Emotionally Abused: No    Physically Abused: No    Sexually Abused: No    Allergies  Allergen Reactions   Cephalexin Diarrhea and Other (See Comments)   Codeine Nausea And Vomiting and Other (See Comments)    Current Facility-Administered Medications  Medication Dose Route Frequency Provider Last Rate Last Admin   Heparin (Porcine) in NaCl 1000-0.9 UT/500ML-% SOLN    PRN Leonie Douglas, MD   500 mL at 11/07/22 1113   sodium chloride flush (NS) 0.9 % injection 3 mL  3 mL Intravenous PRN Leonie Douglas, MD        PHYSICAL EXAM Vitals:   11/07/22 0834 11/07/22 1113  BP: 134/66   Pulse: 75   Resp: 16   Temp: 98.1 F (36.7 C)   TempSrc: Oral   SpO2: 95% 97%  Weight: 113.9 kg   Height: 5\' 5"  (1.651 m)    Chronically ill woman in no distress Left arm brachiobasilic fistula without discrete thrill. Pulsatile "thump" noted in proximal fistula.    PERTINENT LABORATORY AND RADIOLOGIC DATA  Most recent CBC    Latest Ref Rng & Units 11/07/2022    8:33 AM 10/29/2022    9:39 AM  09/08/2022    9:46 AM  CBC  WBC 4.0 - 10.5 K/uL   5.2   Hemoglobin 12.0 - 15.0 g/dL 16.1  09.6  04.5   Hematocrit 36.0 - 46.0 % 35.0  34.0  38.4   Platelets 150 - 400 K/uL   228      Most recent CMP    Latest Ref Rng & Units 11/07/2022    8:33 AM 10/29/2022    9:39 AM 10/08/2022    2:30 PM  CMP  Glucose 70 - 99 mg/dL 409  811    BUN 8 - 23 mg/dL 40  33    Creatinine 9.14 - 1.00 mg/dL 7.82  9.56    Sodium 213 - 145 mmol/L 135  137    Potassium 3.5 - 5.1 mmol/L 3.3  3.5    Chloride 98 - 111 mmol/L 93  94    Total Protein 6.5 - 8.1 g/dL   5.9   Total Bilirubin 0.3 - 1.2 mg/dL   0.3   Alkaline Phos 38 - 126 U/L   146   AST 15 - 41 U/L   16   ALT 0 - 44 U/L   16     Renal function Estimated Creatinine Clearance: 16.6 mL/min (A) (by C-G formula based on SCr of 3.8 mg/dL (H)).  Hgb A1c MFr Bld (%)  Date Value  08/27/2022 7.9 (H)    LDL Cholesterol (Calc)  Date Value Ref Range Status  01/17/2019 65 mg/dL (calc) Final    Comment:    Reference range: <100 . Desirable range <100 mg/dL for primary prevention;   <70 mg/dL for patients with CHD or diabetic patients  with > or = 2 CHD risk factors. Marland Kitchen LDL-C is now calculated using the  Martin-Hopkins  calculation, which is a validated novel method providing  better accuracy than the Friedewald equation in the  estimation of LDL-C.  Horald Pollen et al. Lenox Ahr. 1610;960(45): 2061-2068  (http://education.[facility].com/faq/FAQ164)    LDL Cholesterol  Date Value Ref Range Status  02/01/2022 29 0 - 99 mg/dL Final    Comment:           Total Cholesterol/HDL:CHD Risk Coronary Heart Disease Risk Table                     Men   Women  1/2 Average Risk   3.4   3.3  Average Risk       5.0   4.4  2 X Average Risk   9.6   7.1  3 X Average Risk  23.4   11.0        Use the calculated Patient Ratio above and the CHD Risk Table to determine the patient's CHD Risk.        ATP III CLASSIFICATION (LDL):  <100     mg/dL   Optimal   409-811  mg/dL   Near or Above                    Optimal  130-159  mg/dL   Borderline  914-782  mg/dL   High  >956     mg/dL   Very High Performed at [facility], [address] [place], [place] [address]     Rande Brunt. Lenell Antu, MD FACS [facility] Phone Number: [phone] 11/07/2022 11:31 AM   Total time spent on preparing this encounter including chart review, data review, collecting history, examining the patient, coordinating care for this new patient, 45 minutes.  Portions of this report may have been transcribed using voice recognition software.  Every effort has been made to ensure accuracy; however, inadvertent computerized transcription errors may still be present.

## 2022-11-10 ENCOUNTER — Encounter (HOSPITAL_COMMUNITY): Payer: Self-pay | Admitting: Internal Medicine

## 2022-11-10 ENCOUNTER — Encounter (HOSPITAL_COMMUNITY): Payer: Self-pay | Admitting: Vascular Surgery

## 2022-11-10 ENCOUNTER — Ambulatory Visit (INDEPENDENT_AMBULATORY_CARE_PROVIDER_SITE_OTHER): Payer: PPO | Admitting: Orthopedic Surgery

## 2022-11-10 ENCOUNTER — Other Ambulatory Visit: Payer: Self-pay

## 2022-11-10 ENCOUNTER — Encounter: Payer: Self-pay | Admitting: Orthopedic Surgery

## 2022-11-10 ENCOUNTER — Telehealth: Payer: Self-pay | Admitting: Vascular Surgery

## 2022-11-10 ENCOUNTER — Other Ambulatory Visit (HOSPITAL_COMMUNITY): Payer: PPO

## 2022-11-10 ENCOUNTER — Inpatient Hospital Stay (HOSPITAL_COMMUNITY)
Admission: EM | Admit: 2022-11-10 | Discharge: 2022-11-20 | DRG: 623 | Disposition: A | Discharge status: Rehabilitation Facility | Payer: PPO | Attending: Orthopedic Surgery | Admitting: Orthopedic Surgery

## 2022-11-10 DIAGNOSIS — R252 Cramp and spasm: Secondary | ICD-10-CM | POA: Diagnosis not present

## 2022-11-10 DIAGNOSIS — Z6841 Body Mass Index (BMI) 40.0 and over, adult: Secondary | ICD-10-CM | POA: Diagnosis not present

## 2022-11-10 DIAGNOSIS — R253 Fasciculation: Secondary | ICD-10-CM | POA: Diagnosis not present

## 2022-11-10 DIAGNOSIS — Y92239 Unspecified place in hospital as the place of occurrence of the external cause: Secondary | ICD-10-CM | POA: Diagnosis not present

## 2022-11-10 DIAGNOSIS — D638 Anemia in other chronic diseases classified elsewhere: Secondary | ICD-10-CM | POA: Diagnosis not present

## 2022-11-10 DIAGNOSIS — I959 Hypotension, unspecified: Secondary | ICD-10-CM | POA: Diagnosis present

## 2022-11-10 DIAGNOSIS — Z9181 History of falling: Secondary | ICD-10-CM

## 2022-11-10 DIAGNOSIS — R739 Hyperglycemia, unspecified: Secondary | ICD-10-CM | POA: Diagnosis not present

## 2022-11-10 DIAGNOSIS — I480 Paroxysmal atrial fibrillation: Secondary | ICD-10-CM | POA: Diagnosis not present

## 2022-11-10 DIAGNOSIS — B964 Proteus (mirabilis) (morganii) as the cause of diseases classified elsewhere: Secondary | ICD-10-CM | POA: Diagnosis not present

## 2022-11-10 DIAGNOSIS — E1165 Type 2 diabetes mellitus with hyperglycemia: Secondary | ICD-10-CM | POA: Diagnosis not present

## 2022-11-10 DIAGNOSIS — Z955 Presence of coronary angioplasty implant and graft: Secondary | ICD-10-CM

## 2022-11-10 DIAGNOSIS — I509 Heart failure, unspecified: Secondary | ICD-10-CM | POA: Diagnosis not present

## 2022-11-10 DIAGNOSIS — Y832 Surgical operation with anastomosis, bypass or graft as the cause of abnormal reaction of the patient, or of later complication, without mention of misadventure at the time of the procedure: Secondary | ICD-10-CM | POA: Diagnosis present

## 2022-11-10 DIAGNOSIS — Z794 Long term (current) use of insulin: Secondary | ICD-10-CM | POA: Diagnosis not present

## 2022-11-10 DIAGNOSIS — Z7901 Long term (current) use of anticoagulants: Secondary | ICD-10-CM

## 2022-11-10 DIAGNOSIS — M1990 Unspecified osteoarthritis, unspecified site: Secondary | ICD-10-CM | POA: Diagnosis present

## 2022-11-10 DIAGNOSIS — Z885 Allergy status to narcotic agent status: Secondary | ICD-10-CM

## 2022-11-10 DIAGNOSIS — I132 Hypertensive heart and chronic kidney disease with heart failure and with stage 5 chronic kidney disease, or end stage renal disease: Secondary | ICD-10-CM | POA: Diagnosis not present

## 2022-11-10 DIAGNOSIS — Z7984 Long term (current) use of oral hypoglycemic drugs: Secondary | ICD-10-CM

## 2022-11-10 DIAGNOSIS — F411 Generalized anxiety disorder: Secondary | ICD-10-CM | POA: Diagnosis present

## 2022-11-10 DIAGNOSIS — B879 Myiasis, unspecified: Secondary | ICD-10-CM | POA: Diagnosis not present

## 2022-11-10 DIAGNOSIS — K5900 Constipation, unspecified: Secondary | ICD-10-CM | POA: Diagnosis not present

## 2022-11-10 DIAGNOSIS — Z8719 Personal history of other diseases of the digestive system: Secondary | ICD-10-CM | POA: Diagnosis not present

## 2022-11-10 DIAGNOSIS — I739 Peripheral vascular disease, unspecified: Secondary | ICD-10-CM | POA: Diagnosis not present

## 2022-11-10 DIAGNOSIS — N250 Renal osteodystrophy: Secondary | ICD-10-CM | POA: Diagnosis not present

## 2022-11-10 DIAGNOSIS — I5082 Biventricular heart failure: Secondary | ICD-10-CM | POA: Diagnosis not present

## 2022-11-10 DIAGNOSIS — Z89512 Acquired absence of left leg below knee: Secondary | ICD-10-CM | POA: Diagnosis not present

## 2022-11-10 DIAGNOSIS — Z992 Dependence on renal dialysis: Secondary | ICD-10-CM

## 2022-11-10 DIAGNOSIS — I10 Essential (primary) hypertension: Secondary | ICD-10-CM | POA: Diagnosis not present

## 2022-11-10 DIAGNOSIS — E11628 Type 2 diabetes mellitus with other skin complications: Principal | ICD-10-CM | POA: Diagnosis present

## 2022-11-10 DIAGNOSIS — Z87891 Personal history of nicotine dependence: Secondary | ICD-10-CM | POA: Diagnosis not present

## 2022-11-10 DIAGNOSIS — Z881 Allergy status to other antibiotic agents status: Secondary | ICD-10-CM

## 2022-11-10 DIAGNOSIS — G546 Phantom limb syndrome with pain: Secondary | ICD-10-CM | POA: Diagnosis not present

## 2022-11-10 DIAGNOSIS — Z79890 Hormone replacement therapy (postmenopausal): Secondary | ICD-10-CM

## 2022-11-10 DIAGNOSIS — T148XXA Other injury of unspecified body region, initial encounter: Secondary | ICD-10-CM | POA: Diagnosis not present

## 2022-11-10 DIAGNOSIS — S91002D Unspecified open wound, left ankle, subsequent encounter: Secondary | ICD-10-CM | POA: Diagnosis not present

## 2022-11-10 DIAGNOSIS — E66813 Obesity, class 3: Secondary | ICD-10-CM | POA: Diagnosis present

## 2022-11-10 DIAGNOSIS — J9610 Chronic respiratory failure, unspecified whether with hypoxia or hypercapnia: Secondary | ICD-10-CM | POA: Diagnosis not present

## 2022-11-10 DIAGNOSIS — S88111S Complete traumatic amputation at level between knee and ankle, right lower leg, sequela: Secondary | ICD-10-CM

## 2022-11-10 DIAGNOSIS — I872 Venous insufficiency (chronic) (peripheral): Secondary | ICD-10-CM | POA: Diagnosis present

## 2022-11-10 DIAGNOSIS — R251 Tremor, unspecified: Secondary | ICD-10-CM | POA: Diagnosis not present

## 2022-11-10 DIAGNOSIS — Z89511 Acquired absence of right leg below knee: Secondary | ICD-10-CM | POA: Diagnosis not present

## 2022-11-10 DIAGNOSIS — R262 Difficulty in walking, not elsewhere classified: Secondary | ICD-10-CM | POA: Diagnosis present

## 2022-11-10 DIAGNOSIS — E785 Hyperlipidemia, unspecified: Secondary | ICD-10-CM | POA: Diagnosis not present

## 2022-11-10 DIAGNOSIS — Z8249 Family history of ischemic heart disease and other diseases of the circulatory system: Secondary | ICD-10-CM

## 2022-11-10 DIAGNOSIS — I5042 Chronic combined systolic (congestive) and diastolic (congestive) heart failure: Secondary | ICD-10-CM | POA: Diagnosis present

## 2022-11-10 DIAGNOSIS — I5032 Chronic diastolic (congestive) heart failure: Secondary | ICD-10-CM | POA: Diagnosis not present

## 2022-11-10 DIAGNOSIS — E669 Obesity, unspecified: Secondary | ICD-10-CM | POA: Diagnosis not present

## 2022-11-10 DIAGNOSIS — I252 Old myocardial infarction: Secondary | ICD-10-CM

## 2022-11-10 DIAGNOSIS — E039 Hypothyroidism, unspecified: Secondary | ICD-10-CM | POA: Diagnosis not present

## 2022-11-10 DIAGNOSIS — F329 Major depressive disorder, single episode, unspecified: Secondary | ICD-10-CM | POA: Diagnosis not present

## 2022-11-10 DIAGNOSIS — E11649 Type 2 diabetes mellitus with hypoglycemia without coma: Secondary | ICD-10-CM | POA: Diagnosis not present

## 2022-11-10 DIAGNOSIS — B961 Klebsiella pneumoniae [K. pneumoniae] as the cause of diseases classified elsewhere: Secondary | ICD-10-CM | POA: Diagnosis present

## 2022-11-10 DIAGNOSIS — F332 Major depressive disorder, recurrent severe without psychotic features: Secondary | ICD-10-CM | POA: Diagnosis not present

## 2022-11-10 DIAGNOSIS — I2510 Atherosclerotic heart disease of native coronary artery without angina pectoris: Secondary | ICD-10-CM | POA: Diagnosis present

## 2022-11-10 DIAGNOSIS — L97229 Non-pressure chronic ulcer of left calf with unspecified severity: Secondary | ICD-10-CM | POA: Diagnosis present

## 2022-11-10 DIAGNOSIS — M79605 Pain in left leg: Secondary | ICD-10-CM | POA: Diagnosis not present

## 2022-11-10 DIAGNOSIS — I953 Hypotension of hemodialysis: Secondary | ICD-10-CM | POA: Diagnosis not present

## 2022-11-10 DIAGNOSIS — R609 Edema, unspecified: Secondary | ICD-10-CM | POA: Diagnosis not present

## 2022-11-10 DIAGNOSIS — L03116 Cellulitis of left lower limb: Secondary | ICD-10-CM | POA: Diagnosis not present

## 2022-11-10 DIAGNOSIS — R0602 Shortness of breath: Secondary | ICD-10-CM | POA: Diagnosis not present

## 2022-11-10 DIAGNOSIS — N186 End stage renal disease: Secondary | ICD-10-CM | POA: Diagnosis not present

## 2022-11-10 DIAGNOSIS — D631 Anemia in chronic kidney disease: Secondary | ICD-10-CM | POA: Diagnosis present

## 2022-11-10 DIAGNOSIS — R45851 Suicidal ideations: Secondary | ICD-10-CM | POA: Diagnosis not present

## 2022-11-10 DIAGNOSIS — L089 Local infection of the skin and subcutaneous tissue, unspecified: Secondary | ICD-10-CM | POA: Diagnosis present

## 2022-11-10 DIAGNOSIS — N2581 Secondary hyperparathyroidism of renal origin: Secondary | ICD-10-CM | POA: Diagnosis present

## 2022-11-10 DIAGNOSIS — R110 Nausea: Secondary | ICD-10-CM | POA: Diagnosis not present

## 2022-11-10 DIAGNOSIS — Z9981 Dependence on supplemental oxygen: Secondary | ICD-10-CM

## 2022-11-10 DIAGNOSIS — L89152 Pressure ulcer of sacral region, stage 2: Secondary | ICD-10-CM | POA: Diagnosis not present

## 2022-11-10 DIAGNOSIS — F410 Panic disorder [episodic paroxysmal anxiety] without agoraphobia: Secondary | ICD-10-CM | POA: Diagnosis present

## 2022-11-10 DIAGNOSIS — E1122 Type 2 diabetes mellitus with diabetic chronic kidney disease: Secondary | ICD-10-CM | POA: Diagnosis not present

## 2022-11-10 DIAGNOSIS — B871 Wound myiasis: Secondary | ICD-10-CM | POA: Diagnosis present

## 2022-11-10 DIAGNOSIS — F339 Major depressive disorder, recurrent, unspecified: Secondary | ICD-10-CM | POA: Diagnosis present

## 2022-11-10 DIAGNOSIS — T426X1A Poisoning by other antiepileptic and sedative-hypnotic drugs, accidental (unintentional), initial encounter: Secondary | ICD-10-CM | POA: Diagnosis not present

## 2022-11-10 DIAGNOSIS — S81802D Unspecified open wound, left lower leg, subsequent encounter: Secondary | ICD-10-CM | POA: Diagnosis not present

## 2022-11-10 DIAGNOSIS — Z9842 Cataract extraction status, left eye: Secondary | ICD-10-CM

## 2022-11-10 DIAGNOSIS — Z90710 Acquired absence of both cervix and uterus: Secondary | ICD-10-CM

## 2022-11-10 DIAGNOSIS — I4510 Unspecified right bundle-branch block: Secondary | ICD-10-CM | POA: Diagnosis present

## 2022-11-10 DIAGNOSIS — D649 Anemia, unspecified: Secondary | ICD-10-CM | POA: Diagnosis not present

## 2022-11-10 DIAGNOSIS — I952 Hypotension due to drugs: Secondary | ICD-10-CM | POA: Diagnosis not present

## 2022-11-10 DIAGNOSIS — E1152 Type 2 diabetes mellitus with diabetic peripheral angiopathy with gangrene: Secondary | ICD-10-CM | POA: Diagnosis present

## 2022-11-10 DIAGNOSIS — E11622 Type 2 diabetes mellitus with other skin ulcer: Secondary | ICD-10-CM | POA: Diagnosis present

## 2022-11-10 DIAGNOSIS — I87332 Chronic venous hypertension (idiopathic) with ulcer and inflammation of left lower extremity: Secondary | ICD-10-CM

## 2022-11-10 DIAGNOSIS — E1142 Type 2 diabetes mellitus with diabetic polyneuropathy: Secondary | ICD-10-CM

## 2022-11-10 DIAGNOSIS — S81002D Unspecified open wound, left knee, subsequent encounter: Secondary | ICD-10-CM | POA: Diagnosis not present

## 2022-11-10 DIAGNOSIS — L98491 Non-pressure chronic ulcer of skin of other sites limited to breakdown of skin: Secondary | ICD-10-CM

## 2022-11-10 DIAGNOSIS — T82868A Thrombosis of vascular prosthetic devices, implants and grafts, initial encounter: Secondary | ICD-10-CM | POA: Diagnosis present

## 2022-11-10 DIAGNOSIS — Z8673 Personal history of transient ischemic attack (TIA), and cerebral infarction without residual deficits: Secondary | ICD-10-CM

## 2022-11-10 DIAGNOSIS — R4182 Altered mental status, unspecified: Secondary | ICD-10-CM | POA: Diagnosis not present

## 2022-11-10 DIAGNOSIS — Z4781 Encounter for orthopedic aftercare following surgical amputation: Secondary | ICD-10-CM | POA: Diagnosis not present

## 2022-11-10 DIAGNOSIS — R9431 Abnormal electrocardiogram [ECG] [EKG]: Secondary | ICD-10-CM | POA: Diagnosis not present

## 2022-11-10 DIAGNOSIS — Z79899 Other long term (current) drug therapy: Secondary | ICD-10-CM

## 2022-11-10 DIAGNOSIS — R42 Dizziness and giddiness: Secondary | ICD-10-CM | POA: Diagnosis not present

## 2022-11-10 DIAGNOSIS — I120 Hypertensive chronic kidney disease with stage 5 chronic kidney disease or end stage renal disease: Secondary | ICD-10-CM | POA: Diagnosis not present

## 2022-11-10 DIAGNOSIS — S81802A Unspecified open wound, left lower leg, initial encounter: Secondary | ICD-10-CM | POA: Diagnosis not present

## 2022-11-10 DIAGNOSIS — Z9841 Cataract extraction status, right eye: Secondary | ICD-10-CM

## 2022-11-10 DIAGNOSIS — E782 Mixed hyperlipidemia: Secondary | ICD-10-CM | POA: Diagnosis not present

## 2022-11-10 DIAGNOSIS — F32A Depression, unspecified: Secondary | ICD-10-CM | POA: Diagnosis not present

## 2022-11-10 DIAGNOSIS — Z87442 Personal history of urinary calculi: Secondary | ICD-10-CM

## 2022-11-10 HISTORY — DX: Type 2 diabetes mellitus with unspecified diabetic retinopathy without macular edema: E11.319

## 2022-11-10 LAB — CBC WITH DIFFERENTIAL/PLATELET
Abs Immature Granulocytes: 0.03 10*3/uL (ref 0.00–0.07)
Basophils Absolute: 0 10*3/uL (ref 0.0–0.1)
Basophils Relative: 0 %
Eosinophils Absolute: 0.3 10*3/uL (ref 0.0–0.5)
Eosinophils Relative: 4 %
HCT: 36.5 % (ref 36.0–46.0)
Hemoglobin: 11 g/dL — ABNORMAL LOW (ref 12.0–15.0)
Immature Granulocytes: 0 %
Lymphocytes Relative: 10 %
Lymphs Abs: 0.7 10*3/uL (ref 0.7–4.0)
MCH: 24.2 pg — ABNORMAL LOW (ref 26.0–34.0)
MCHC: 30.1 g/dL (ref 30.0–36.0)
MCV: 80.2 fL (ref 80.0–100.0)
Monocytes Absolute: 0.5 10*3/uL (ref 0.1–1.0)
Monocytes Relative: 7 %
Neutro Abs: 5.5 10*3/uL (ref 1.7–7.7)
Neutrophils Relative %: 79 %
Platelets: 125 10*3/uL — ABNORMAL LOW (ref 150–400)
RBC: 4.55 MIL/uL (ref 3.87–5.11)
RDW: 16.8 % — ABNORMAL HIGH (ref 11.5–15.5)
WBC: 7 10*3/uL (ref 4.0–10.5)
nRBC: 0 % (ref 0.0–0.2)

## 2022-11-10 LAB — GLUCOSE, RANDOM: Glucose, Bld: 533 mg/dL (ref 70–99)

## 2022-11-10 LAB — COMPREHENSIVE METABOLIC PANEL
ALT: 19 U/L (ref 0–44)
AST: 30 U/L (ref 15–41)
Albumin: 3 g/dL — ABNORMAL LOW (ref 3.5–5.0)
Alkaline Phosphatase: 201 U/L — ABNORMAL HIGH (ref 38–126)
Anion gap: 16 — ABNORMAL HIGH (ref 5–15)
BUN: 56 mg/dL — ABNORMAL HIGH (ref 8–23)
CO2: 20 mmol/L — ABNORMAL LOW (ref 22–32)
Calcium: 8.9 mg/dL (ref 8.9–10.3)
Chloride: 93 mmol/L — ABNORMAL LOW (ref 98–111)
Creatinine, Ser: 4.03 mg/dL — ABNORMAL HIGH (ref 0.44–1.00)
GFR, Estimated: 11 mL/min — ABNORMAL LOW (ref >60)
Glucose, Bld: 334 mg/dL — ABNORMAL HIGH (ref 70–99)
Potassium: 3.8 mmol/L (ref 3.5–5.1)
Sodium: 129 mmol/L — ABNORMAL LOW (ref 135–145)
Total Bilirubin: 0.9 mg/dL (ref 0.3–1.2)
Total Protein: 6.5 g/dL (ref 6.5–8.1)

## 2022-11-10 LAB — LACTIC ACID, PLASMA: Lactic Acid, Venous: 1.1 mmol/L (ref 0.5–1.9)

## 2022-11-10 LAB — GLUCOSE, CAPILLARY: Glucose-Capillary: 525 mg/dL (ref 70–99)

## 2022-11-10 LAB — I-STAT CHEM 8, ED
BUN: 51 mg/dL — ABNORMAL HIGH (ref 8–23)
Calcium, Ion: 1.1 mmol/L — ABNORMAL LOW (ref 1.15–1.40)
Chloride: 92 mmol/L — ABNORMAL LOW (ref 98–111)
Creatinine, Ser: 4.2 mg/dL — ABNORMAL HIGH (ref 0.44–1.00)
Glucose, Bld: 477 mg/dL — ABNORMAL HIGH (ref 70–99)
HCT: 37 % (ref 36.0–46.0)
Hemoglobin: 12.6 g/dL (ref 12.0–15.0)
Potassium: 3.1 mmol/L — ABNORMAL LOW (ref 3.5–5.1)
Sodium: 129 mmol/L — ABNORMAL LOW (ref 135–145)
TCO2: 27 mmol/L (ref 22–32)

## 2022-11-10 LAB — C-REACTIVE PROTEIN: CRP: 3.6 mg/dL — ABNORMAL HIGH (ref <1.0)

## 2022-11-10 LAB — PREALBUMIN: Prealbumin: 12 mg/dL — ABNORMAL LOW (ref 18–38)

## 2022-11-10 LAB — SEDIMENTATION RATE: Sed Rate: 17 mm/hr (ref 0–22)

## 2022-11-10 MED ORDER — INSULIN ASPART 100 UNIT/ML IJ SOLN
0.0000 [IU] | Freq: Every day | INTRAMUSCULAR | Status: DC
  Administered 2022-11-14: 4 [IU] via SUBCUTANEOUS
  Administered 2022-11-15: 3 [IU] via SUBCUTANEOUS
  Administered 2022-11-16 – 2022-11-18 (×2): 2 [IU] via SUBCUTANEOUS

## 2022-11-10 MED ORDER — INSULIN GLARGINE-YFGN 100 UNIT/ML ~~LOC~~ SOLN
45.0000 [IU] | Freq: Every day | SUBCUTANEOUS | Status: DC
  Administered 2022-11-10: 45 [IU] via SUBCUTANEOUS
  Filled 2022-11-10 (×2): qty 0.45

## 2022-11-10 MED ORDER — ACETAMINOPHEN 650 MG RE SUPP: 650.0000 mg | Freq: Four times a day (QID) | RECTAL | Status: DC | PRN

## 2022-11-10 MED ORDER — DAKINS (1/4 STRENGTH) 0.125 % EX SOLN: Freq: Two times a day (BID) | CUTANEOUS | Status: DC

## 2022-11-10 MED ORDER — SENNOSIDES-DOCUSATE SODIUM 8.6-50 MG PO TABS
2.0000 | ORAL_TABLET | Freq: Two times a day (BID) | ORAL | Status: DC
  Administered 2022-11-11 – 2022-11-18 (×13): 2 via ORAL
  Filled 2022-11-10 (×17): qty 2

## 2022-11-10 MED ORDER — ACETAMINOPHEN 325 MG PO TABS
650.0000 mg | ORAL_TABLET | Freq: Four times a day (QID) | ORAL | Status: DC | PRN
  Administered 2022-11-12 – 2022-11-15 (×2): 650 mg via ORAL
  Filled 2022-11-10 (×2): qty 2

## 2022-11-10 MED ORDER — CAMPHOR-MENTHOL 0.5-0.5 % EX LOTN: 1.0000 | TOPICAL_LOTION | Freq: Three times a day (TID) | CUTANEOUS | Status: DC | PRN

## 2022-11-10 MED ORDER — PREGABALIN 25 MG PO CAPS
75.0000 mg | ORAL_CAPSULE | Freq: Every day | ORAL | Status: DC
  Administered 2022-11-11: 75 mg via ORAL
  Filled 2022-11-10: qty 3

## 2022-11-10 MED ORDER — NEPRO/CARBSTEADY PO LIQD
237.0000 mL | Freq: Every day | ORAL | Status: DC
  Administered 2022-11-11 – 2022-11-17 (×4): 237 mL via ORAL

## 2022-11-10 MED ORDER — FOLIC ACID 1 MG PO TABS
1.0000 mg | ORAL_TABLET | Freq: Every day | ORAL | Status: DC
  Administered 2022-11-11 – 2022-11-17 (×6): 1 mg via ORAL
  Filled 2022-11-10 (×9): qty 1

## 2022-11-10 MED ORDER — VANCOMYCIN HCL 2000 MG/400ML IV SOLN
2000.0000 mg | Freq: Once | INTRAVENOUS | Status: AC
  Administered 2022-11-10: 2000 mg via INTRAVENOUS
  Filled 2022-11-10: qty 400

## 2022-11-10 MED ORDER — VORTIOXETINE HBR 20 MG PO TABS
20.0000 mg | ORAL_TABLET | Freq: Every morning | ORAL | Status: AC
  Administered 2022-11-11 – 2022-11-13 (×3): 20 mg via ORAL
  Filled 2022-11-10 (×3): qty 1

## 2022-11-10 MED ORDER — DAKINS (1/4 STRENGTH) 0.125 % EX SOLN
Freq: Two times a day (BID) | CUTANEOUS | Status: AC
  Administered 2022-11-13: 1
  Filled 2022-11-10: qty 473

## 2022-11-10 MED ORDER — POLYETHYLENE GLYCOL 3350 17 G PO PACK: 17.0000 g | PACK | Freq: Every day | ORAL | Status: DC | PRN

## 2022-11-10 MED ORDER — AMIODARONE HCL 200 MG PO TABS
200.0000 mg | ORAL_TABLET | Freq: Every day | ORAL | Status: DC
  Administered 2022-11-12 – 2022-11-17 (×5): 200 mg via ORAL
  Filled 2022-11-10 (×9): qty 1

## 2022-11-10 MED ORDER — HYDROXYZINE HCL 25 MG PO TABS
25.0000 mg | ORAL_TABLET | Freq: Three times a day (TID) | ORAL | Status: DC | PRN
  Administered 2022-11-19: 25 mg via ORAL
  Filled 2022-11-10: qty 1

## 2022-11-10 MED ORDER — OXYCODONE HCL 5 MG PO TABS
5.0000 mg | ORAL_TABLET | ORAL | Status: DC | PRN
  Administered 2022-11-10 – 2022-11-14 (×3): 5 mg via ORAL
  Filled 2022-11-10 (×3): qty 1

## 2022-11-10 MED ORDER — BISACODYL 5 MG PO TBEC: 5.0000 mg | DELAYED_RELEASE_TABLET | Freq: Every day | ORAL | Status: DC | PRN

## 2022-11-10 MED ORDER — DOCUSATE SODIUM 100 MG PO CAPS
100.0000 mg | ORAL_CAPSULE | Freq: Two times a day (BID) | ORAL | Status: DC
  Administered 2022-11-10 – 2022-11-13 (×7): 100 mg via ORAL
  Filled 2022-11-10 (×7): qty 1

## 2022-11-10 MED ORDER — SORBITOL 70 % SOLN: 30.0000 mL | Status: DC | PRN

## 2022-11-10 MED ORDER — PANTOPRAZOLE SODIUM 40 MG PO TBEC
40.0000 mg | DELAYED_RELEASE_TABLET | Freq: Two times a day (BID) | ORAL | Status: DC
  Administered 2022-11-11 – 2022-11-18 (×15): 40 mg via ORAL
  Filled 2022-11-10 (×16): qty 1

## 2022-11-10 MED ORDER — MIDODRINE HCL 5 MG PO TABS: 5.0000 mg | ORAL_TABLET | ORAL | Status: DC

## 2022-11-10 MED ORDER — ZOLPIDEM TARTRATE 5 MG PO TABS: 5.0000 mg | ORAL_TABLET | Freq: Every evening | ORAL | Status: DC | PRN

## 2022-11-10 MED ORDER — INSULIN ASPART 100 UNIT/ML IJ SOLN
7.0000 [IU] | Freq: Once | INTRAMUSCULAR | Status: AC
  Administered 2022-11-10: 7 [IU] via SUBCUTANEOUS

## 2022-11-10 MED ORDER — HYDRALAZINE HCL 20 MG/ML IJ SOLN: 5.0000 mg | INTRAMUSCULAR | Status: DC | PRN

## 2022-11-10 MED ORDER — LEVOTHYROXINE SODIUM 75 MCG PO TABS
75.0000 ug | ORAL_TABLET | Freq: Every day | ORAL | Status: DC
  Administered 2022-11-11 – 2022-11-19 (×9): 75 ug via ORAL
  Filled 2022-11-10 (×9): qty 1

## 2022-11-10 MED ORDER — APIXABAN 5 MG PO TABS
5.0000 mg | ORAL_TABLET | Freq: Two times a day (BID) | ORAL | Status: DC
  Administered 2022-11-10 – 2022-11-13 (×4): 5 mg via ORAL
  Filled 2022-11-10 (×4): qty 1

## 2022-11-10 MED ORDER — CALCIUM CARBONATE ANTACID 1250 MG/5ML PO SUSP
500.0000 mg | Freq: Four times a day (QID) | ORAL | Status: DC | PRN
  Administered 2022-11-12: 500 mg via ORAL
  Filled 2022-11-10 (×3): qty 5

## 2022-11-10 MED ORDER — DOCUSATE SODIUM 283 MG RE ENEM: 1.0000 | ENEMA | RECTAL | Status: DC | PRN

## 2022-11-10 MED ORDER — PIPERACILLIN-TAZOBACTAM IN DEX 2-0.25 GM/50ML IV SOLN
2.2500 g | Freq: Three times a day (TID) | INTRAVENOUS | Status: DC
  Administered 2022-11-10 – 2022-11-14 (×11): 2.25 g via INTRAVENOUS
  Filled 2022-11-10 (×15): qty 50

## 2022-11-10 MED ORDER — INSULIN ASPART 100 UNIT/ML IJ SOLN
0.0000 [IU] | Freq: Three times a day (TID) | INTRAMUSCULAR | Status: DC
  Administered 2022-11-11: 2 [IU] via SUBCUTANEOUS
  Administered 2022-11-11: 3 [IU] via SUBCUTANEOUS
  Administered 2022-11-12: 4 [IU] via SUBCUTANEOUS
  Administered 2022-11-12: 3 [IU] via SUBCUTANEOUS
  Administered 2022-11-13 (×2): 1 [IU] via SUBCUTANEOUS
  Administered 2022-11-14: 2 [IU] via SUBCUTANEOUS
  Administered 2022-11-15: 3 [IU] via SUBCUTANEOUS
  Administered 2022-11-15: 5 [IU] via SUBCUTANEOUS
  Administered 2022-11-15: 1 [IU] via SUBCUTANEOUS
  Administered 2022-11-16: 3 [IU] via SUBCUTANEOUS
  Administered 2022-11-16: 2 [IU] via SUBCUTANEOUS
  Administered 2022-11-16: 1 [IU] via SUBCUTANEOUS
  Administered 2022-11-18: 2 [IU] via SUBCUTANEOUS
  Administered 2022-11-18: 1 [IU] via SUBCUTANEOUS

## 2022-11-10 MED ORDER — SEVELAMER CARBONATE 800 MG PO TABS
1600.0000 mg | ORAL_TABLET | Freq: Three times a day (TID) | ORAL | Status: DC
  Administered 2022-11-11 – 2022-11-18 (×19): 1600 mg via ORAL
  Filled 2022-11-10 (×21): qty 2

## 2022-11-10 MED ORDER — ATORVASTATIN CALCIUM 80 MG PO TABS
80.0000 mg | ORAL_TABLET | Freq: Every day | ORAL | Status: DC
  Administered 2022-11-11 – 2022-11-17 (×6): 80 mg via ORAL
  Filled 2022-11-10 (×4): qty 1
  Filled 2022-11-10: qty 2
  Filled 2022-11-10 (×4): qty 1

## 2022-11-10 MED ORDER — LORAZEPAM 0.5 MG PO TABS
0.5000 mg | ORAL_TABLET | Freq: Every day | ORAL | Status: DC
  Administered 2022-11-10 – 2022-11-18 (×9): 0.5 mg via ORAL
  Filled 2022-11-10 (×9): qty 1

## 2022-11-10 MED FILL — Lidocaine HCl Local Preservative Free (PF) Inj 1%: INTRAMUSCULAR | Qty: 30 | Status: AC

## 2022-11-10 NOTE — ED Triage Notes (Signed)
Pt came in via POV d/t wound on mid-Lt calf being infected for the past week. Does see PCP for management but was told to come in for eval. Denies fevers, endorses drainage from the area. A/Ox4, rates pain 9/10 & describes it to be radiating up & down her leg.

## 2022-11-10 NOTE — Consult Note (Addendum)
WOC Nurse Consult Note: Reason for Consult:LLE with acute infestation of larvae. Dr. Lajoyce Corners is consulting and plans for surgical procedure on Wednesday or Friday of this week.Photodocumentation of wound is provided to the EMR by Provider and is appreciated. Also noted is a partial thickness Stage 2 pressure injury to the sacrum, 2cm x 3cm and superficial, depth is 0.1cm Wound type:Infectious Pressure Injury POA: N/A Measurement:Last measurement by Dr, Audrie Lia staff is 5cm x 3cm with depth obscured by the presence of nonviable tissue. Wound YQI:[id number] tissue. Drainage (amount, consistency, odor) small serosanguinous Periwound:erythematous, edematous Dressing procedure/placement/frequency:I have provided Nursing with guidance in the care of this wound using sodium hypochlorite (Dakin's) solution to both cleanse and dress the wound. One-quarter strength sodium hypochlorite solution will be used twice daily to moisten gauze and be placed as a wound contact layer. This is to be covered with dry gauze and secured with Kerlix roll gauze/paper tape. Stage 2 PI to the sacrum will be treated with a daily cleanse followed by covering of the lesion with a xeroform gauze. This will be topped with a dry gauze and secured with a silicone foam. The xeroform is to be changed daily, The silicone foam may be reused for up to 3 days and changed PRN soiling.  WOC nursing team will not follow, but will remain available to this patient, the nursing and medical teams.  Please re-consult if needed.  Thank you for inviting [name] to participate in this patient's Plan of Care.  Ladona Mow, MSN, RN, CNS, GNP, Leda Min, [employer], [employer] phone:  [phone]

## 2022-11-10 NOTE — ED Notes (Signed)
ED TO INPATIENT HANDOFF REPORT  ED Nurse Name and Phone #: [phone]  S Name/Age/Gender Susan [name] 72 y.o. female Room/Bed: H012C/H012C  Code Status   Code Status: Full Code  Home/SNF/Other Home Patient oriented to: self, place, time, and situation Is this baseline? Yes   Triage Complete: Triage complete  Chief Complaint Diabetic foot infection (HCC) [J47.829, L08.9]  Triage Note Pt came in via POV d/t wound on mid-Lt calf being infected for the past week. Does see PCP for management but was told to come in for eval. Denies fevers, endorses drainage from the area. A/Ox4, rates pain 9/10 & describes it to be radiating up & down her leg.    Allergies Allergies  Allergen Reactions   Cephalexin Diarrhea and Other (See Comments)   Codeine Nausea And Vomiting and Other (See Comments)    Level of Care/Admitting Diagnosis ED Disposition     ED Disposition  Admit   Condition  --   Comment  Hospital Area: [facility] [[id number]]  Level of Care: Med-Surg [[id number]]  May admit patient to [name] or [facility] if equivalent level of care is available:: No  Covid Evaluation: Asymptomatic - no recent exposure (last 10 days) testing not required  Diagnosis: Diabetic foot infection [facility]) [[id number]]  Admitting Physician: Jonah Blue [[id number]]  Attending Physician: Jonah Blue [[id number]]  Certification:: I certify this patient will need inpatient services for at least 2 midnights  Estimated Length of Stay: 4          B Medical/Surgery History Past Medical History:  Diagnosis Date   Anemia    hx   Anxiety    Arthritis    "generalized" (03/15/2014)   CAD (coronary artery disease)    MI in 2000 - MI  2007 - treated bare metal stent (no nuclear since then as 9/11)   Carotid artery disease (HCC)    Chronic diastolic heart failure (HCC)    a) ECHO (08/2013) EF 55-60% and RV function nl b) RHC (08/2013) RA 4, RV 30/5/7, PA 25/10 (16), PCWP 7, Fick CO/CI 6.3/2.7,  PVR 1.5 WU, PA 61 and 66%   Daily headache    "~ every other day; since I fell in June" (03/15/2014)   Depression    Dyslipidemia    ESRD (end stage renal disease) (HCC)    Dialysis on Tues Thurs Sat   Exertional shortness of breath    History of kidney stones    HTN (hypertension)    Hypothyroidism    Obesity    Osteoarthritis    PAF (paroxysmal atrial fibrillation) (HCC)    Peripheral neuropathy    bilateral feet/hands   PONV (postoperative nausea and vomiting)    RBBB (right bundle branch block)    Old   Stroke (HCC)    mini strokes   Type II diabetes mellitus (HCC)    Type II, [name]. patient has omnipod insulin pump with Novolin R Insulin   Past Surgical History:  Procedure Laterality Date   A/V FISTULAGRAM Left 11/07/2022   Procedure: A/V Fistulagram;  Surgeon: Leonie Douglas, MD;  [facility] IN[facility];  Service: Cardiovascular;  Laterality: Left;   ABDOMINAL HYSTERECTOMY  1980's   AMPUTATION Right 02/24/2018   Procedure: RIGHT FOOT GREAT TOE AND 2ND TOE AMPUTATION;  Surgeon: Nadara Mustard, MD;  Location: [facility] OR;  Service: Orthopedics;  Laterality: Right;   AMPUTATION Right 04/30/2018   Procedure: RIGHT TRANSMETATARSAL AMPUTATION;  Surgeon: Lajoyce Corners,  Randa Evens, MD;  Location: [facility] OR;  Service: Orthopedics;  Laterality: Right;   AMPUTATION Right 05/02/2022   Procedure: RIGHT BELOW KNEE AMPUTATION;  Surgeon: Nadara Mustard, MD;  Location: [facility] OR;  Service: Orthopedics;  Laterality: Right;   APPLICATION OF WOUND VAC Right 06/13/2022   Procedure: APPLICATION OF WOUND VAC;  Surgeon: Nadara Mustard, MD;  [facility]: [facility] OR;  Service: Orthopedics;  Laterality: Right;   AV FISTULA PLACEMENT Left 04/02/2022   Procedure: LEFT ARM ARTERIOVENOUS (AV) FISTULA CREATION;  Surgeon: Nada Libman, MD;  Location: [facility] OR;  Service: Vascular;  Laterality: Left;  PERIPHERAL NERVE BLOCK   BASCILIC VEIN TRANSPOSITION Left 07/31/2022   Procedure: LEFT ARM SECOND STAGE BASILIC  VEIN TRANSPOSITION;  Surgeon: Nada Libman, MD;  [facility]: [facility];  Service: Vascular;  Laterality: Left;   BIOPSY  05/27/2020   Procedure: BIOPSY;  Surgeon: Lanelle Bal, DO;  Location: [facility] ENDO SU[facility];  Service: Endoscopy;;   CATARACT EXTRACTION, BILATERAL Bilateral ?2013   COLONOSCOPY W/ POLYPECTOMY     COLONOSCOPY WITH PROPOFOL N/A 03/13/2019   Procedure: COLONOSCOPY WITH PROPOFOL;  Surgeon: Beverley Fiedler, MD;  Location: [facility] ENDOSCOPY;  Service: Gastroenterology;  Laterality: N/A;   CORONARY ANGIOPLASTY WITH STENT PLACEMENT  1999; 2007   "1 + 1"   ERCP N/A 02/03/2022   Procedure: ENDOSCOPIC RETROGRADE CHOLANGIOPANCREATOGRAPHY (ERCP);  Surgeon: Jeani Hawking, MD;  Location: [facility] ENDOSCOPY;  Service: Gastroenterology;  Laterality: N/A;   ESOPHAGOGASTRODUODENOSCOPY (EGD) WITH PROPOFOL N/A 03/13/2019   Procedure: ESOPHAGOGASTRODUODENOSCOPY (EGD) WITH PROPOFOL;  Surgeon: Beverley Fiedler, MD;  Location: [facility] ENDOSCOPY;  Service: Gastroenterology;  Laterality: N/A;   ESOPHAGOGASTRODUODENOSCOPY (EGD) WITH PROPOFOL N/A 05/27/2020   Procedure: ESOPHAGOGASTRODUODENOSCOPY (EGD) WITH PROPOFOL;  Surgeon: Lanelle Bal, DO;  [facility];  Service: Endoscopy;  Laterality: N/A;   ESOPHAGOGASTRODUODENOSCOPY (EGD) WITH PROPOFOL N/A 09/03/2022   Procedure: ESOPHAGOGASTRODUODENOSCOPY (EGD) WITH PROPOFOL;  Surgeon: Jeani Hawking, MD;  Location: [facility] ENDOSCOPY;  Service: Gastroenterology;  Laterality: N/A;   EYE SURGERY Bilateral    lazer   FLEXIBLE SIGMOIDOSCOPY N/A 05/23/2022   Procedure: FLEXIBLE SIGMOIDOSCOPY;  Surgeon: Jeani Hawking, MD;  Location: [facility] ENDOSCOPY;  Service: Gastroenterology;  Laterality: N/A;   FLEXIBLE SIGMOIDOSCOPY N/A 05/24/2022   Procedure: FLEXIBLE SIGMOIDOSCOPY;  Surgeon: Imogene Burn, MD;  Location: [facility] ENDOSCOPY;  Service: Gastroenterology;  Laterality: N/A;   FLEXIBLE SIGMOIDOSCOPY N/A 09/03/2022   Procedure: FLEXIBLE SIGMOIDOSCOPY;  Surgeon: Jeani Hawking, MD;  Location: [facility]  ENDOSCOPY;  Service: Gastroenterology;  Laterality: N/A;   HEMOSTASIS CLIP PLACEMENT  03/13/2019   Procedure: HEMOSTASIS CLIP PLACEMENT;  Surgeon: Beverley Fiedler, MD;  Location: [facility] ENDOSCOPY;  Service: Gastroenterology;;   HEMOSTASIS CLIP PLACEMENT  05/23/2022   Procedure: HEMOSTASIS CLIP PLACEMENT;  Surgeon: Jeani Hawking, MD;  Location: [facility] ENDOSCOPY;  Service: Gastroenterology;;   HEMOSTASIS CONTROL  05/24/2022   Procedure: HEMOSTASIS CONTROL;  Surgeon: Imogene Burn, MD;  Location: [facility] ENDOSCOPY;  Service: Gastroenterology;;   HOT HEMOSTASIS N/A 05/23/2022   Procedure: HOT HEMOSTASIS (ARGON PLASMA COAGULATION/BICAP);  Surgeon: Jeani Hawking, MD;  Location: [facility] ENDOSCOPY;  Service: Gastroenterology;  Laterality: N/A;   I & D EXTREMITY Left 05/05/2022   Procedure: IRRIGATION AND DEBRIDEMENT LEFT ARM AV FISTULA;  Surgeon: Cephus Shelling, MD;  Location: [facility] OR;  Service: Vascular;  Laterality: Left;   INSERTION OF DIALYSIS CATHETER Right 04/02/2022   Procedure: INSERTION OF TUNNELED DIALYSIS CATHETER;  Surgeon: Nada Libman, MD;  [facility];  Service: Vascular;  Laterality: Right;   KNEE ARTHROSCOPY Left 10/25/2006   POLYPECTOMY  03/13/2019   Procedure: POLYPECTOMY;  Surgeon: Beverley Fiedler, MD;  Location: [facility] ENDOSCOPY;  Service: Gastroenterology;;   REMOVAL OF STONES  02/03/2022   Procedure: REMOVAL OF STONES;  Surgeon: Jeani Hawking, MD;  Location: [facility] ENDOSCOPY;  Service: Gastroenterology;;   REVISON OF ARTERIOVENOUS FISTULA Left 08/20/2022   Procedure: REVISON OF LEFT ARM ARTERIOVENOUS FISTULA;  Surgeon: Nada Libman, MD;  [facility];  Service: Vascular;  Laterality: Left;   RIGHT HEART CATH N/A 07/24/2017   Procedure: RIGHT HEART CATH;  Surgeon: Dolores Patty, MD;  [facility];  Service: Cardiovascular;  Laterality: N/A;   RIGHT HEART CATHETERIZATION N/A 09/22/2013   Procedure: RIGHT HEART CATH;  Surgeon: Dolores Patty, MD;  Location: [facility] CATH LAB;   Service: Cardiovascular;  Laterality: N/A;   SHOULDER ARTHROSCOPY WITH OPEN ROTATOR CUFF REPAIR Right 03/14/2014   Procedure: RIGHT SHOULDER ARTHROSCOPY WITH BICEPS RELEASE, OPEN SUBSCAPULA REPAIR, OPEN SUPRASPINATUS REPAIR.;  Surgeon: Cammy Copa, MD;  Location: [facility] OR;  Service: Orthopedics;  Laterality: Right;   SPHINCTEROTOMY  02/03/2022   Procedure: SPHINCTEROTOMY;  Surgeon: Jeani Hawking, MD;  Location: [facility] ENDOSCOPY;  Service: Gastroenterology;;   STUMP REVISION Right 06/13/2022   Procedure: REVISION RIGHT BELOW KNEE AMPUTATION;  Surgeon: Nadara Mustard, MD;  Location: [facility] OR;  Service: Orthopedics;  Laterality: Right;   TEE WITHOUT CARDIOVERSION N/A 02/04/2022   Procedure: TRANSESOPHAGEAL ECHOCARDIOGRAM (TEE);  Surgeon: Dolores Patty, MD;  Location: [facility] ENDOSCOPY;  Service: Cardiovascular;  Laterality: N/A;   THROMBECTOMY W/ EMBOLECTOMY Left 08/20/2022   Procedure: THROMBECTOMY OF LEFT ARM ARTERIOVENOUS FISTULA;  Surgeon: Nada Libman, MD;  [facility];  Service: Vascular;  Laterality: Left;   TOE AMPUTATION Right 02/24/2018   GREAT TOE AND 2ND TOE AMPUTATION   TUBAL LIGATION  1970's     A IV Location/Drains/Wounds Patient Lines/Drains/Airways Status     Active Line/Drains/Airways     Name Placement date Placement time Site Days   Peripheral IV 11/10/22 20 G Right Antecubital 11/10/22  --  Antecubital  less than 1   Fistula / Graft Left Upper arm --  --  Upper arm  --   Fistula / Graft Left Upper arm Arteriovenous fistula 07/31/22  0942  Upper arm  102   Hemodialysis Catheter Right Subclavian 06/06/22  --  Subclavian  157   Negative Pressure Wound Therapy Leg Right;Circumferential;Lower 06/13/22  1548  --  150   Pressure Injury 05/23/22 Buttocks Right Deep Tissue Pressure Injury - Purple or maroon localized area of discolored intact skin or blood-filled blister due to damage of underlying soft tissue from pressure and/or shear. 05/23/22  1715  -- 171   Pressure  Injury 05/23/22 Right;Left;Posterior Stage 1 -  Intact skin with non-blanchable redness of a localized area usually over a bony prominence. 05/23/22  --  -- 171   Pressure Injury 08/27/22 Coccyx Mid Stage 2 -  Partial thickness loss of dermis presenting as a shallow open injury with a red, pink wound bed without slough. 26mmx5mmx0 red center unattached edges 08/27/22  1000  -- 75   Wound / Incision (Open or Dehisced) 05/02/22 Irritant Dermatitis (Moisture Associated Skin Damage) Thigh Anterior;Distal;Left 05/02/22  1500  Thigh  192   Wound / Incision (Open or Dehisced) 05/23/22 Non-pressure wound Thigh Right;Anterior black scar 05/23/22  1740  Thigh  171   Wound / Incision (Open or Dehisced) 08/27/22 Irritant  Dermatitis (Moisture Associated Skin Damage) Right 08/27/22  1000  --  75   Wound / Incision (Open or Dehisced) 08/27/22 (IAD) Incontinence Associated Dermatitis Buttocks Right 08/27/22  1000  Buttocks  75            Intake/Output Last 24 hours No intake or output data in the 24 hours ending 11/10/22 1756  Labs/Imaging Results for orders placed or performed during the hospital encounter of 11/10/22 (from the past 48 hour(s))  Comprehensive metabolic panel     Status: Abnormal   Collection Time: 11/10/22  1:45 PM  Result Value Ref Range   Sodium 129 (L) 135 - 145 mmol/L   Potassium 3.8 3.5 - 5.1 mmol/L    Comment: HEMOLYSIS AT THIS LEVEL MAY AFFECT RESULT   Chloride 93 (L) 98 - 111 mmol/L   CO2 20 (L) 22 - 32 mmol/L   Glucose, Bld 334 (H) 70 - 99 mg/dL    Comment: Glucose reference range applies only to samples taken after fasting for at least 8 hours.   BUN 56 (H) 8 - 23 mg/dL   Creatinine, Ser 1.61 (H) 0.44 - 1.00 mg/dL   Calcium 8.9 8.9 - 09.6 mg/dL   Total Protein 6.5 6.5 - 8.1 g/dL   Albumin 3.0 (L) 3.5 - 5.0 g/dL   AST 30 15 - 41 U/L    Comment: HEMOLYSIS AT THIS LEVEL MAY AFFECT RESULT   ALT 19 0 - 44 U/L    Comment: HEMOLYSIS AT THIS LEVEL MAY AFFECT RESULT   Alkaline  Phosphatase 201 (H) 38 - 126 U/L   Total Bilirubin 0.9 0.3 - 1.2 mg/dL    Comment: HEMOLYSIS AT THIS LEVEL MAY AFFECT RESULT   GFR, Estimated 11 (L) >60 mL/min    Comment: (NOTE) Calculated using the CKD-EPI Creatinine Equation (2021)    Anion gap 16 (H) 5 - 15    Comment: Performed at [facility], [address] [place], [place] [address]  CBC with Differential     Status: Abnormal   Collection Time: 11/10/22  1:45 PM  Result Value Ref Range   WBC 7.0 4.0 - 10.5 K/uL   RBC 4.55 3.87 - 5.11 MIL/uL   Hemoglobin 11.0 (L) 12.0 - 15.0 g/dL   HCT 98.1 19.1 - 47.8 %   MCV 80.2 80.0 - 100.0 fL   MCH 24.2 (L) 26.0 - 34.0 pg   MCHC 30.1 30.0 - 36.0 g/dL   RDW 29.5 (H) 62.1 - 30.8 %   Platelets 125 (L) 150 - 400 K/uL    Comment: REPEATED TO VERIFY   nRBC 0.0 0.0 - 0.2 %   Neutrophils Relative % 79 %   Neutro Abs 5.5 1.7 - 7.7 K/uL   Lymphocytes Relative 10 %   Lymphs Abs 0.7 0.7 - 4.0 K/uL   Monocytes Relative 7 %   Monocytes Absolute 0.5 0.1 - 1.0 K/uL   Eosinophils Relative 4 %   Eosinophils Absolute 0.3 0.0 - 0.5 K/uL   Basophils Relative 0 %   Basophils Absolute 0.0 0.0 - 0.1 K/uL   Immature Granulocytes 0 %   Abs Immature Granulocytes 0.03 0.00 - 0.07 K/uL    Comment: Performed at [facility], [address], [place], [place] [address]  Lactic acid     Status: None   Collection Time: 11/10/22  2:00 PM  Result Value Ref Range   Lactic Acid, Venous 1.1 0.5 - 1.9 mmol/L    Comment: Performed  at [facility], [address] [place], [place] [address]  I-stat chem 8, ed     Status: Abnormal   Collection Time: 11/10/22  4:49 PM  Result Value Ref Range   Sodium 129 (L) 135 - 145 mmol/L   Potassium 3.1 (L) 3.5 - 5.1 mmol/L   Chloride 92 (L) 98 - 111 mmol/L   BUN 51 (H) 8 - 23 mg/dL   Creatinine, Ser 6.04 (H) 0.44 - 1.00 mg/dL   Glucose, Bld 540 (H) 70 - 99 mg/dL    Comment: Glucose reference range applies only to samples taken after fasting for at least 8  hours.   Calcium, Ion 1.10 (L) 1.15 - 1.40 mmol/L   TCO2 27 22 - 32 mmol/L   Hemoglobin 12.6 12.0 - 15.0 g/dL   HCT 98.1 19.1 - 47.8 %   *Note: Due to a large number of results and/or encounters for the requested time period, some results have not been displayed. A complete set of results can be found in Results Review.   No results found.  Pending Labs Unresulted Labs (From admission, onward)     Start     Ordered   11/11/22 0500  Basic metabolic panel  Tomorrow morning,   R        11/10/22 1754   11/11/22 0500  CBC  Tomorrow morning,   R        11/10/22 1754   11/10/22 1752  Sedimentation rate  (COPD / Pneumonia / Cellulitis / Lower Extremity Wound)  Once,   R        11/10/22 1754   11/10/22 1752  C-reactive protein  (COPD / Pneumonia / Cellulitis / Lower Extremity Wound)  Once,   R        11/10/22 1754   11/10/22 1752  Prealbumin  (COPD / Pneumonia / Cellulitis / Lower Extremity Wound)  Once,   R        11/10/22 1754   11/10/22 1258  Blood Cultures x 2 sites  BLOOD CULTURE X 2,   STAT      11/10/22 1258            Vitals/Pain Today's Vitals   11/10/22 1234 11/10/22 1248  BP: (!) 122/49   Pulse: 70   Resp: 16   Temp: 98 F (36.7 C)   SpO2: 95%   PainSc:  10-Worst pain ever    Isolation Precautions No active isolations  Medications Medications  vancomycin (VANCOREADY) IVPB 2000 mg/400 mL (2,000 mg Intravenous New Bag/Given 11/10/22 1739)  piperacillin-tazobactam (ZOSYN) IVPB 2.25 g (0 g Intravenous Stopped 11/10/22 1755)  amiodarone (PACERONE) tablet 200 mg (has no administration in time range)  atorvastatin (LIPITOR) tablet 80 mg (has no administration in time range)  midodrine (PROAMATINE) tablet 5 mg (has no administration in time range)  LORazepam (ATIVAN) tablet 0.5 mg (has no administration in time range)  vortioxetine HBr (TRINTELLIX) tablet 20 mg (has no administration in time range)  insulin glargine-yfgn (SEMGLEE) injection 16 Units (has no  administration in time range)  levothyroxine (SYNTHROID) tablet 75 mcg (has no administration in time range)  pantoprazole (PROTONIX) EC tablet 40 mg (has no administration in time range)  senna-docusate (Senokot-S) tablet 2 tablet (has no administration in time range)  sevelamer carbonate (RENVELA) tablet 1,600 mg (has no administration in time range)  apixaban (ELIQUIS) tablet 5 mg (has no administration in time range)  folic acid (FOLVITE) tablet 1 mg (has no administration in time  range)  pregabalin (LYRICA) capsule 75 mg (has no administration in time range)  feeding supplement (NEPRO CARB STEADY) liquid 237 mL (has no administration in time range)  acetaminophen (TYLENOL) tablet 650 mg (has no administration in time range)    Or  acetaminophen (TYLENOL) suppository 650 mg (has no administration in time range)  zolpidem (AMBIEN) tablet 5 mg (has no administration in time range)  sorbitol 70 % solution 30 mL (has no administration in time range)  docusate sodium (ENEMEEZ) enema 283 mg (has no administration in time range)  camphor-menthol (SARNA) lotion 1 Application (has no administration in time range)    And  hydrOXYzine (ATARAX) tablet 25 mg (has no administration in time range)  calcium carbonate (dosed in mg elemental calcium) suspension 500 mg of elemental calcium (has no administration in time range)  insulin aspart (novoLOG) injection 0-5 Units (has no administration in time range)  docusate sodium (COLACE) capsule 100 mg (has no administration in time range)  polyethylene glycol (MIRALAX / GLYCOLAX) packet 17 g (has no administration in time range)  bisacodyl (DULCOLAX) EC tablet 5 mg (has no administration in time range)  hydrALAZINE (APRESOLINE) injection 5 mg (has no administration in time range)  insulin aspart (novoLOG) injection 0-6 Units (has no administration in time range)  oxyCODONE (Oxy IR/ROXICODONE) immediate release tablet 5 mg (has no administration in time  range)    Mobility non-ambulatory     Focused Assessments    R Recommendations: See Admitting Provider Note  Report given to:   Additional Notes:

## 2022-11-10 NOTE — ED Notes (Signed)
Unable to collect blood at

## 2022-11-10 NOTE — Progress Notes (Signed)
Pharmacy Antibiotic Note  Susan [name] is a 72 y.o. female admitted on 11/10/2022 with cellulitis. She has wound on mid left calf and has been infected for past week with pain radiating down her leg. Has history of BKA and poorly controlled DM. Pharmacy has been consulted for zosyn and vancomycin dosing. WBC 7, lactate 1.1, afebrile sCr 4.2 (plan to start HD tomorrow per nephro and hospitalist).   Has history of HD, but had fistula issues and most recently underwent fistulogram on 6/14 with thrombosis with plan to f/u with vascular in 4 weeks for access other than TDC.    Plan: Zosyn 2.25g IV every 8 hours  Vancomycin 2g IV x1 Vancomycin 1000mg  IV after HD pending HD schedule Monitor plans for HD and for schedule TTS Monitor for signs of clinical improvement, WBC, fever curve Follow up plans for surgical debridement     Temp (24hrs), Avg:98 F (36.7 C), Min:98 F (36.7 C), Max:98 F (36.7 C)  Recent Labs  Lab 11/07/22 0833 11/10/22 1345 11/10/22 1400  WBC  --  7.0  --   CREATININE 3.80* 4.03*  --   LATICACIDVEN  --   --  1.1    Estimated Creatinine Clearance: 15.7 mL/min (A) (by C-G formula based on SCr of 4.03 mg/dL (H)).    Allergies  Allergen Reactions   Cephalexin Diarrhea and Other (See Comments)   Codeine Nausea And Vomiting and Other (See Comments)    Antimicrobials this admission: Zosyn 6/17 >>  Vancomycin 6/17 >>   Microbiology results: 6/17 BCx:    Thank you for allowing pharmacy to be a part of this patient's care.  Arabella Merles, PharmD. [facility] Acute Care PGY-1 11/10/2022 5:57 PM

## 2022-11-10 NOTE — Telephone Encounter (Signed)
Appt has been scheduled.

## 2022-11-10 NOTE — Progress Notes (Signed)
Office Visit Note   Patient: Susan [name]           Date of Birth: 1951/03/15           MRN: [id number] Visit Date: 11/10/2022              Requested by: Donita Brooks, MD [address] [place],  [place] [address] PCP: Donita Brooks, MD  Chief Complaint  Patient presents with   Left Leg - Wound Check    registry pt #16 visit #2      HPI: Patient is a 72 year old woman who is seen for venous ulcer left leg she is Registry patient 16 visit #2.  Patient presents with acute maggot infestation of her wound.  Assessment & Plan: Visit Diagnoses:  1. Below-knee amputation of right lower extremity, sequela (HCC)   2. Ischemic ulcer, limited to breakdown of skin (HCC)   3. 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  Follow-Up Instructions: Return in about 4 weeks (around 12/08/2022).   Ortho Exam  Patient is alert, oriented, no adenopathy, well-dressed, normal affect, normal respiratory effort. Examination patient has a palpable dorsalis pedis pulse the ulcer is 6 x 7 cm with increased necrotic tissue there is maggots within the wound.  Imaging: No results found.     Labs: Lab Results  Component Value Date   HGBA1C 7.9 (H) 08/27/2022   HGBA1C 8.9 (H) 05/02/2022   HGBA1C >15.5 (H) 01/20/2022   CRP 0.6 09/01/2022   CRP 0.7 08/22/2020   CRP 1.5 (H) 03/13/2020   LABURIC 4.7 08/09/2020   LABURIC 9.1 (H) 10/12/2018   LABURIC 9.0 (H) 03/06/2018   REPTSTATUS 08/30/2022 FINAL 08/29/2022   GRAMSTAIN  05/05/2022    FEW WBC PRESENT, PREDOMINANTLY PMN NO ORGANISMS SEEN    CULT MULTIPLE SPECIES PRESENT, SUGGEST RECOLLECTION (A) 08/29/2022    LABORGA SERRATIA MARCESCENS 05/05/2022     Lab Results  Component Value Date   ALBUMIN 2.7 (L) 10/08/2022   ALBUMIN 2.9 (L) 09/08/2022   ALBUMIN 2.2 (L) 09/05/2022   PREALBUMIN 13 (L) 05/02/2022    Lab Results  Component Value Date   MG 1.9 09/04/2022   MG 1.8 09/03/2022   MG 1.6 (L) 09/02/2022   Lab Results  Component Value Date   VD25OH 42.14 05/02/2022    Lab Results  Component Value Date   PREALBUMIN 13 (L) 05/02/2022      Latest Ref Rng & Units 11/07/2022    8:33 AM 10/29/2022    9:39 AM 09/08/2022    9:46 AM  CBC EXTENDED  WBC 4.0 - 10.5 K/uL   5.2   RBC 3.87 - 5.11 MIL/uL   4.79   Hemoglobin 12.0 - 15.0 g/dL 19.1  47.8  29.5   HCT 36.0 - 46.0 % 35.0  34.0  38.4   Platelets 150 - 400 K/uL   228   NEUT# 1.7 - 7.7 K/uL   4.6   Lymph# 0.7 - 4.0 K/uL   0.3      There is no height or weight on file to calculate BMI.  Orders:  No orders of the defined types were placed  in this encounter.  No orders of the defined types were placed in this encounter.    Procedures: No procedures performed  Clinical Data: No additional findings.  ROS:  All other systems negative, except as noted in the HPI. Review of Systems  Objective: Vital Signs: There were no vitals taken for this visit.  Specialty Comments:  No specialty comments available.  PMFS History: Patient Active Problem List   Diagnosis Date Noted   Secondary hypercoagulable state (HCC) 09/04/2022   Right sided abdominal pain 08/31/2022   Constipation 06/07/2022   History of Clostridioides difficile colitis 06/06/2022   Below-knee amputation of right lower extremity (HCC) 06/06/2022   Diverticulitis 06/05/2022   Stercoral colitis 06/05/2022   C. 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) 04/07/2022   Allergy, unspecified, initial encounter 04/07/2022   Dependence on renal dialysis (HCC) 04/07/2022   Gout due to renal impairment, unspecified site 04/07/2022   Hypertensive heart and chronic kidney disease with heart failure and with stage 5 chronic kidney disease, or end stage renal disease (HCC) 04/07/2022   Personal history of transient ischemic attack (TIA), and cerebral infarction without residual deficits 04/07/2022   Renal osteodystrophy 04/07/2022   Venous stasis ulcer of right calf (HCC) 03/31/2022   Fistula, colovaginal 03/26/2022   Diarrhea 03/26/2022   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   Sepsis without acute organ dysfunction (HCC)    Bacteremia    Acute pancreatitis 02/01/2022   Abdominal pain 02/01/2022   SIRS (systemic inflammatory response syndrome) (HCC) 02/01/2022   Transaminitis 02/01/2022   History of anemia due to chronic kidney disease 02/01/2022   Paroxysmal atrial fibrillation (HCC) 02/01/2022   DKA (diabetic ketoacidosis) (HCC) 01/14/2022   NSTEMI (non-ST elevated myocardial infarction) (HCC) 03/05/2021   Acute renal failure superimposed on stage 4 chronic kidney disease (HCC) 08/22/2020   Hypoalbuminemia 05/25/2020   GERD (gastroesophageal reflux disease) 05/25/2020   Pressure injury of skin 05/17/2020   Acute on chronic combined systolic and diastolic congestive heart failure (HCC) 03/07/2020   Type 2 diabetes mellitus with diabetic polyneuropathy, with long-term current use of insulin (HCC) 03/07/2020   Obesity, Class III, BMI 40-49.9 (morbid obesity) (HCC) 03/07/2020   Common bile duct (CBD)  obstruction 05/28/2019   Benign neoplasm of ascending colon    Benign neoplasm of transverse colon    Benign neoplasm of descending colon    Benign neoplasm of sigmoid colon    Gastric polyps    Hyperkalemia 03/11/2019   Prolonged QT interval 03/11/2019   Acute blood loss anemia 03/11/2019   Onychomycosis 06/21/2018   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)    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 (HCC)    PVD (peripheral vascular disease) (HCC) 10/26/2017   E-coli UTI 07/27/2017   Hypothyroidism 07/27/2017   AKI (acute kidney injury) (HCC)    PAH (pulmonary artery hypertension) (HCC)    Impaired ambulation 07/19/2017   Nausea & vomiting 07/15/2017   Leg cramps 02/27/2017  Peripheral edema 01/12/2017   Diabetic neuropathy (HCC) 11/12/2016   CKD (chronic kidney disease), stage IV (HCC) 10/24/2015   Anemia of chronic disease 10/03/2015   Generalized anxiety disorder 10/03/2015   Insomnia 10/03/2015   Hyperglycemia due to diabetes mellitus (HCC) 06/07/2015   Chronic diastolic CHF (congestive heart failure) (HCC) 06/07/2015   Non compliance with medical treatment 04/17/2014   Rotator cuff tear 03/14/2014   Class 3 obesity (HCC) 09/23/2013   Chronic HFrEF (heart failure with reduced ejection fraction) (HCC) 06/03/2013   Hypotension 12/25/2012   Urinary incontinence    MDD (major depressive disorder) 11/12/2010   RBBB (right bundle branch block)    Wide-complex tachycardia    Coronary artery disease    Hyperlipemia 01/22/2009   Essential hypertension 01/22/2009   Past Medical History:  Diagnosis Date   Acute MI (HCC) 1999; 2007; 03/05/21   Anemia    hx   Anginal pain (HCC)    Anxiety    ARF (acute renal failure) (HCC) 06/2017   [facility]   Arthritis    "generalized" (03/15/2014)   CAD (coronary artery disease)    MI in 2000 - MI  2007 - treated bare metal stent (no nuclear since then as 9/11)   Carotid artery disease (HCC)     CHF (congestive heart failure) (HCC)    HFrEF 03/06/21   Chronic diastolic heart failure (HCC)    a) ECHO (08/2013) EF 55-60% and RV function nl b) RHC (08/2013) RA 4, RV 30/5/7, PA 25/10 (16), PCWP 7, Fick CO/CI 6.3/2.7, PVR 1.5 WU, PA 61 and 66%   Daily headache    "~ every other day; since I fell in June" (03/15/2014)   Depression    Dyslipidemia    Dyspnea    uses oxygen qhs via [place]   ESRD (end stage renal disease) (HCC)    Dialysis on Tues Thurs Sat   Exertional shortness of breath    History of kidney stones    HTN (hypertension)    Hypothyroidism    Neuropathy    Obesity    Osteoarthritis    PAF (paroxysmal atrial fibrillation) (HCC)    Peripheral neuropathy    bilateral feet/hands   PONV (postoperative nausea and vomiting)    RBBB (right bundle branch block)    Old   Stroke (HCC)    mini strokes   Syncope    likely due to low blood sugar   Tachycardia    Sinus tachycardia   Type II diabetes mellitus (HCC)    Type II, [name]. patient has omnipod insulin pump with Novolin R Insulin   Urinary incontinence    Venous insufficiency     Family History  Problem Relation Age of Onset   Heart attack Mother 42    Past Surgical History:  Procedure Laterality Date   A/V FISTULAGRAM Left 11/07/2022   Procedure: A/V Fistulagram;  Surgeon: Leonie Douglas, MD;  Location: [facility];  Service: Cardiovascular;  Laterality: Left;   ABDOMINAL HYSTERECTOMY  1980's   AMPUTATION Right 02/24/2018   Procedure: RIGHT FOOT GREAT TOE AND 2ND TOE AMPUTATION;  Surgeon: Nadara Mustard, MD;  Location: [facility] OR;  Service: Orthopedics;  Laterality: Right;   AMPUTATION Right 04/30/2018   Procedure: RIGHT TRANSMETATARSAL AMPUTATION;  Surgeon: Nadara Mustard, MD;  Location: [facility] OR;  Service: Orthopedics;  Laterality: Right;   AMPUTATION Right 05/02/2022   Procedure: RIGHT BELOW KNEE AMPUTATION;  Surgeon: Nadara Mustard,  MD;  Location: [facility] OR;  Service: Orthopedics;   Laterality: Right;   APPLICATION OF WOUND VAC Right 06/13/2022   Procedure: APPLICATION OF WOUND VAC;  Surgeon: Nadara Mustard, MD;  Location: [facility] OR;  Service: Orthopedics;  Laterality: Right;   AV FISTULA PLACEMENT Left 04/02/2022   Procedure: LEFT ARM ARTERIOVENOUS (AV) FISTULA CREATION;  Surgeon: Nada Libman, MD;  Location: [facility] OR;  Service: Vascular;  Laterality: Left;  PERIPHERAL NERVE BLOCK   BASCILIC VEIN TRANSPOSITION Left 07/31/2022   Procedure: LEFT ARM SECOND STAGE BASILIC VEIN TRANSPOSITION;  Surgeon: Nada Libman, MD;  Location: [facility] OR;  Service: Vascular;  Laterality: Left;   BIOPSY  05/27/2020   Procedure: BIOPSY;  Surgeon: Lanelle Bal, DO;  Location: [facility];  Service: Endoscopy;;   CATARACT EXTRACTION, BILATERAL Bilateral ?2013   COLONOSCOPY W/ POLYPECTOMY     COLONOSCOPY WITH PROPOFOL N/A 03/13/2019   Procedure: COLONOSCOPY WITH PROPOFOL;  Surgeon: Beverley Fiedler, MD;  Location[facility] ENDOSCOPY;  Service: Gastroenterology;  Laterality: N/A;   CORONARY ANGIOPLASTY WITH STENT PLACEMENT  1999; 2007   "1 + 1"   ERCP N/A 02/03/2022   Procedure: ENDOSCOPIC RETROGRADE CHOLANGIOPANCREATOGRAPHY (ERCP);  Surgeon: Jeani Hawking, MD;  Location: [facility] ENDOSCOPY;  Service: Gastroenterology;  Laterality: N/A;   ESOPHAGOGASTRODUODENOSCOPY (EGD) WITH PROPOFOL N/A 03/13/2019   Procedure: ESOPHAGOGASTRODUODENOSCOPY (EGD) WITH PROPOFOL;  Surgeon: Beverley Fiedler, MD;  Location: [facility] ENDOSCOPY;  Service: Gastroenterology;  Laterality: N/A;   ESOPHAGOGASTRODUODENOSCOPY (EGD) WITH PROPOFOL N/A 05/27/2020   Procedure: ESOPHAGOGASTRODUODENOSCOPY (EGD) WITH PROPOFOL;  Surgeon: Lanelle Bal, DO;  [facility];  Service: Endoscopy;  Laterality: N/A;   ESOPHAGOGASTRODUODENOSCOPY (EGD) WITH PROPOFOL N/A 09/03/2022   Procedure: ESOPHAGOGASTRODUODENOSCOPY (EGD) WITH PROPOFOL;  Surgeon: Jeani Hawking, MD;  Location: [facility] ENDOSCOPY;  Service: Gastroenterology;  Laterality: N/A;   EYE SURGERY  Bilateral    lazer   FLEXIBLE SIGMOIDOSCOPY N/A 05/23/2022   Procedure: FLEXIBLE SIGMOIDOSCOPY;  Surgeon: Jeani Hawking, MD;  Location: [facility] ENDOSCOPY;  Service: Gastroenterology;  Laterality: N/A;   FLEXIBLE SIGMOIDOSCOPY N/A 05/24/2022   Procedure: FLEXIBLE SIGMOIDOSCOPY;  Surgeon: Imogene Burn, MD;  Location: [facility] ENDOSCOPY;  Service: Gastroenterology;  Laterality: N/A;   FLEXIBLE SIGMOIDOSCOPY N/A 09/03/2022   Procedure: FLEXIBLE SIGMOIDOSCOPY;  Surgeon: Jeani Hawking, MD;  Location: [facility] ENDOSCOPY;  Service: Gastroenterology;  Laterality: N/A;   HEMOSTASIS CLIP PLACEMENT  03/13/2019   Procedure: HEMOSTASIS CLIP PLACEMENT;  Surgeon: Beverley Fiedler, MD;  Location: [facility] ENDOSCOPY;  Service: Gastroenterology;;   HEMOSTASIS CLIP PLACEMENT  05/23/2022   Procedure: HEMOSTASIS CLIP PLACEMENT;  Surgeon: Jeani Hawking, MD;  Location: [facility] ENDOSCOPY;  Service: Gastroenterology;;   HEMOSTASIS CONTROL  05/24/2022   Procedure: HEMOSTASIS CONTROL;  Surgeon: Imogene Burn, MD;  Location: [facility] ENDOSCOPY;  Service: Gastroenterology;;   HOT HEMOSTASIS N/A 05/23/2022   Procedure: HOT HEMOSTASIS (ARGON PLASMA COAGULATION/BICAP);  Surgeon: Jeani Hawking, MD;  Location: [facility] ENDOSCOPY;  Service: Gastroenterology;  Laterality: N/A;   I & D EXTREMITY Left 05/05/2022   Procedure: IRRIGATION AND DEBRIDEMENT LEFT ARM AV FISTULA;  Surgeon: Cephus Shelling, MD;  Location: [facility] OR;  Service: Vascular;  Laterality: Left;   INSERTION OF DIALYSIS CATHETER Right 04/02/2022   Procedure: INSERTION OF TUNNELED DIALYSIS CATHETER;  Surgeon: Nada Libman, MD;  Location: [facility] OR;  Service: Vascular;  Laterality: Right;   KNEE ARTHROSCOPY Left 10/25/2006   POLYPECTOMY  03/13/2019   Procedure: POLYPECTOMY;  Surgeon: Beverley Fiedler, MD;  Location: [facility] ENDOSCOPY;  Service: Gastroenterology;;  REMOVAL OF STONES  02/03/2022   Procedure: REMOVAL OF STONES;  Surgeon: Jeani Hawking, MD;  Location: [facility] ENDOSCOPY;  Service: Gastroenterology;;    REVISON OF ARTERIOVENOUS FISTULA Left 08/20/2022   Procedure: REVISON OF LEFT ARM ARTERIOVENOUS FISTULA;  Surgeon: Nada Libman, MD;  Location: [facility] OR;  Service: Vascular;  Laterality: Left;   RIGHT HEART CATH N/A 07/24/2017   Procedure: RIGHT HEART CATH;  Surgeon: Dolores Patty, MD;  [facility]: [facility];  Service: Cardiovascular;  Laterality: N/A;   RIGHT HEART CATHETERIZATION N/A 09/22/2013   Procedure: RIGHT HEART CATH;  Surgeon: Dolores Patty, MD;  Location: [name] CATH LAB;  Service: Cardiovascular;  Laterality: N/A;   SHOULDER ARTHROSCOPY WITH OPEN ROTATOR CUFF REPAIR Right 03/14/2014   Procedure: RIGHT SHOULDER ARTHROSCOPY WITH BICEPS RELEASE, OPEN SUBSCAPULA REPAIR, OPEN SUPRASPINATUS REPAIR.;  Surgeon: Cammy Copa, MD;  Location: [facility] OR;  Service: Orthopedics;  Laterality: Right;   SPHINCTEROTOMY  02/03/2022   Procedure: SPHINCTEROTOMY;  Surgeon: Jeani Hawking, MD;  Location: [facility] ENDOSCOPY;  Service: Gastroenterology;;   STUMP REVISION Right 06/13/2022   Procedure: REVISION RIGHT BELOW KNEE AMPUTATION;  Surgeon: Nadara Mustard, MD;  Location: [facility] OR;  Service: Orthopedics;  Laterality: Right;   TEE WITHOUT CARDIOVERSION N/A 02/04/2022   Procedure: TRANSESOPHAGEAL ECHOCARDIOGRAM (TEE);  Surgeon: Dolores Patty, MD;  Location: [facility] ENDOSCOPY;  Service: Cardiovascular;  Laterality: N/A;   THROMBECTOMY W/ EMBOLECTOMY Left 08/20/2022   Procedure: THROMBECTOMY OF LEFT ARM ARTERIOVENOUS FISTULA;  Surgeon: Nada Libman, MD;  Location: [facility] OR;  Service: Vascular;  Laterality: Left;   TOE AMPUTATION Right 02/24/2018   GREAT TOE AND 2ND TOE AMPUTATION   TUBAL LIGATION  1970's   Social History   Occupational History    Employer: UNEMPLOYED  Tobacco Use   Smoking status: Former    Packs/day: 3.00    Years: 32.00    Additional pack years: 0.00    Total pack years: 96.00    Types: Cigarettes    Quit date: 10/24/1997    Years since quitting: 25.0   Smokeless tobacco:  Never  Vaping Use   Vaping Use: Never used  Substance and Sexual Activity   Alcohol use: Not Currently    Comment: "might have 2-3 daiquiris in the summer"   Drug use: No   Sexual activity: Not Currently    Birth control/protection: Surgical    Comment: Hysterectomy

## 2022-11-10 NOTE — ED Provider Triage Note (Signed)
Emergency Medicine Provider Triage Evaluation Note  Susan [name] , a 72 y.o. female  was evaluated in triage.  Pt complains of leg wound. Report having a wound to LLE with worsening pain x 2 weeks.  No fever, no recent trauma.  Was seen by orthopedist Dr. Lajoyce Corners who recommend coming to ER for IV abx and plan for surgical debridement on Wednesday  Review of Systems  Positive: As above Negative: As above  Physical Exam  BP (!) 122/49   Pulse 70   Temp 98 F (36.7 C)   Resp 16   SpO2 95%  Gen:   Awake, no distress   Resp:  Normal effort  MSK:   Moves extremities without difficulty  Other:  LLE wrapped in ACE wrap  Medical Decision Making  Medically screening exam initiated at 12:54 PM.  Appropriate orders placed.  Susan [name] was informed that the remainder of the evaluation will be completed by another provider, this initial triage assessment does not replace that evaluation, and the importance of remaining in the ED until their evaluation is complete.     Fayrene Helper, PA-C 11/10/22 [id number]

## 2022-11-10 NOTE — Telephone Encounter (Signed)
calling to schedule appt. Vm full

## 2022-11-10 NOTE — Telephone Encounter (Signed)
-----   Message from Leonie Douglas, MD sent at 11/07/2022 11:38 AM EDT ----- [name] 11/07/2022 Procedure: 1) left arm fistulagram 2) left arm angiogram  Assistant: none Follow up: 4 weeks with any MD Studies for follow up: vein mapping  Thank you! Susan [name]

## 2022-11-10 NOTE — ED Provider Notes (Signed)
[place] EMERGENCY DEPARTMENT AT [facility] Provider Note   HPI: Susan [name] is a 72 year old female with a past medical history as below notable for ESRD on dialysis Tuesday/Thursday/Saturday presenting today from orthopedic surgery clinic with concerns of an infection.  She has a history of a right BKA.  She reports her left leg started having venous insufficiency ulcer 2 weeks ago that has turned into an infection.  She has not had fevers or chills.  She reports the orthopedics team recommended admission to the hospital for IV antibiotics and possible surgical debridement this week.  The patient reports she has been compliant with dialysis and is next due for it tomorrow.  Past Medical History:  Diagnosis Date   Acute MI (HCC) 1999; 2007; 03/05/21   Anemia    hx   Anginal pain (HCC)    Anxiety    ARF (acute renal failure) (HCC) 06/2017   [facility]   Arthritis    "generalized" (03/15/2014)   CAD (coronary artery disease)    MI in 2000 - MI  2007 - treated bare metal stent (no nuclear since then as 9/11)   Carotid artery disease (HCC)    CHF (congestive heart failure) (HCC)    HFrEF 03/06/21   Chronic diastolic heart failure (HCC)    a) ECHO (08/2013) EF 55-60% and RV function nl b) RHC (08/2013) RA 4, RV 30/5/7, PA 25/10 (16), PCWP 7, Fick CO/CI 6.3/2.7, PVR 1.5 WU, PA 61 and 66%   Daily headache    "~ every other day; since I fell in June" (03/15/2014)   Depression    Dyslipidemia    Dyspnea    uses oxygen qhs via [place]   ESRD (end stage renal disease) (HCC)    Dialysis on Tues Thurs Sat   Exertional shortness of breath    History of kidney stones    HTN (hypertension)    Hypothyroidism    Neuropathy    Obesity    Osteoarthritis    PAF (paroxysmal atrial fibrillation) (HCC)    Peripheral neuropathy    bilateral feet/hands   PONV (postoperative nausea and vomiting)    RBBB (right bundle branch block)    Old   Stroke (HCC)    mini strokes    Syncope    likely due to low blood sugar   Tachycardia    Sinus tachycardia   Type II diabetes mellitus (HCC)    Type II, [name]. patient has omnipod insulin pump with Novolin R Insulin   Urinary incontinence    Venous insufficiency     Past Surgical History:  Procedure Laterality Date   A/V FISTULAGRAM Left 11/07/2022   Procedure: A/V Fistulagram;  Surgeon: Leonie Douglas, MD;  Location: [facility] INVASIVE CV LAB;  Service: Cardiovascular;  Laterality: Left;   ABDOMINAL HYSTERECTOMY  1980's   AMPUTATION Right 02/24/2018   Procedure: RIGHT FOOT GREAT TOE AND 2ND TOE AMPUTATION;  Surgeon: Nadara Mustard, MD;  Location: [facility] OR;  Service: Orthopedics;  Laterality: Right;   AMPUTATION Right 04/30/2018   Procedure: RIGHT TRANSMETATARSAL AMPUTATION;  Surgeon: Nadara Mustard, MD;  Location: [facility] OR;  Service: Orthopedics;  Laterality: Right;   AMPUTATION Right 05/02/2022   Procedure: RIGHT BELOW KNEE AMPUTATION;  Surgeon: Nadara Mustard, MD;  Location: [facility] OR;  Service: Orthopedics;  Laterality: Right;   APPLICATION OF WOUND VAC Right 06/13/2022   Procedure: APPLICATION OF WOUND VAC;  Surgeon: Nadara Mustard,  MD;  Location: [facility] OR;  Service: Orthopedics;  Laterality: Right;   AV FISTULA PLACEMENT Left 04/02/2022   Procedure: LEFT ARM ARTERIOVENOUS (AV) FISTULA CREATION;  Surgeon: Nada Libman, MD;  Location: [facility] OR;  Service: Vascular;  Laterality: Left;  PERIPHERAL NERVE BLOCK   BASCILIC VEIN TRANSPOSITION Left 07/31/2022   Procedure: LEFT ARM SECOND STAGE BASILIC VEIN TRANSPOSITION;  Surgeon: Nada Libman, MD;  Location: [facility] OR;  Service: Vascular;  Laterality: Left;   BIOPSY  05/27/2020   Procedure: BIOPSY;  Surgeon: Lanelle Bal, DO;  Location: [facility] ENDO SUITE;  Service: Endoscopy;;   CATARACT EXTRACTION, BILATERAL Bilateral ?2013   COLONOSCOPY W/ POLYPECTOMY     COLONOSCOPY WITH PROPOFOL N/A 03/13/2019   Procedure: COLONOSCOPY WITH PROPOFOL;  Surgeon: Beverley Fiedler, MD;   Location: [facility] ENDOSCOPY;  Service: Gastroenterology;  Laterality: N/A;   CORONARY ANGIOPLASTY WITH STENT PLACEMENT  1999; 2007   "1 + 1"   ERCP N/A 02/03/2022   Procedure: ENDOSCOPIC RETROGRADE CHOLANGIOPANCREATOGRAPHY (ERCP);  Surgeon: Jeani Hawking, MD;  Location: [facility] ENDOSCOPY;  Service: Gastroenterology;  Laterality: N/A;   ESOPHAGOGASTRODUODENOSCOPY (EGD) WITH PROPOFOL N/A 03/13/2019   Procedure: ESOPHAGOGASTRODUODENOSCOPY (EGD) WITH PROPOFOL;  Surgeon: Beverley Fiedler, MD;  Location: [facility] ENDOSCOPY;  Service: Gastroenterology;  Laterality: N/A;   ESOPHAGOGASTRODUODENOSCOPY (EGD) WITH PROPOFOL N/A 05/27/2020   Procedure: ESOPHAGOGASTRODUODENOSCOPY (EGD) WITH PROPOFOL;  Surgeon: Lanelle Bal, DO;  [facility];  Service: Endoscopy;  Laterality: N/A;   ESOPHAGOGASTRODUODENOSCOPY (EGD) WITH PROPOFOL N/A 09/03/2022   Procedure: ESOPHAGOGASTRODUODENOSCOPY (EGD) WITH PROPOFOL;  Surgeon: Jeani Hawking, MD;  Location: [facility] ENDOSCOPY;  Service: Gastroenterology;  Laterality: N/A;   EYE SURGERY Bilateral    lazer   FLEXIBLE SIGMOIDOSCOPY N/A 05/23/2022   Procedure: FLEXIBLE SIGMOIDOSCOPY;  Surgeon: Jeani Hawking, MD;  Location: [facility] ENDOSCOPY;  Service: Gastroenterology;  Laterality: N/A;   FLEXIBLE SIGMOIDOSCOPY N/A 05/24/2022   Procedure: FLEXIBLE SIGMOIDOSCOPY;  Surgeon: Imogene Burn, MD;  Location: [facility] ENDOSCOPY;  Service: Gastroenterology;  Laterality: N/A;   FLEXIBLE SIGMOIDOSCOPY N/A 09/03/2022   Procedure: FLEXIBLE SIGMOIDOSCOPY;  Surgeon: Jeani Hawking, MD;  Location: [facility] ENDOSCOPY;  Service: Gastroenterology;  Laterality: N/A;   HEMOSTASIS CLIP PLACEMENT  03/13/2019   Procedure: HEMOSTASIS CLIP PLACEMENT;  Surgeon: Beverley Fiedler, MD;  Location: [facility] ENDOSCOPY;  Service: Gastroenterology;;   HEMOSTASIS CLIP PLACEMENT  05/23/2022   Procedure: HEMOSTASIS CLIP PLACEMENT;  Surgeon: Jeani Hawking, MD;  Location: [facility] ENDOSCOPY;  Service: Gastroenterology;;   HEMOSTASIS CONTROL  05/24/2022    Procedure: HEMOSTASIS CONTROL;  Surgeon: Imogene Burn, MD;  Location: [facility] ENDOSCOPY;  Service: Gastroenterology;;   HOT HEMOSTASIS N/A 05/23/2022   Procedure: HOT HEMOSTASIS (ARGON PLASMA COAGULATION/BICAP);  Surgeon: Jeani Hawking, MD;  Location: [facility] ENDOSCOPY;  Service: Gastroenterology;  Laterality: N/A;   I & D EXTREMITY Left 05/05/2022   Procedure: IRRIGATION AND DEBRIDEMENT LEFT ARM AV FISTULA;  Surgeon: Cephus Shelling, MD;  Location: [facility] OR;  Service: Vascular;  Laterality: Left;   INSERTION OF DIALYSIS CATHETER Right 04/02/2022   Procedure: INSERTION OF TUNNELED DIALYSIS CATHETER;  Surgeon: Nada Libman, MD;  Location: [facility] OR;  Service: Vascular;  Laterality: Right;   KNEE ARTHROSCOPY Left 10/25/2006   POLYPECTOMY  03/13/2019   Procedure: POLYPECTOMY;  Surgeon: Beverley Fiedler, MD;  Location: [facility] ENDOSCOPY;  Service: Gastroenterology;;   REMOVAL OF STONES  02/03/2022   Procedure: REMOVAL OF STONES;  Surgeon: Jeani Hawking, MD;  Location: [facility] ENDOSCOPY;  Service: Gastroenterology;;   REVISON OF ARTERIOVENOUS FISTULA  Left 08/20/2022   Procedure: REVISON OF LEFT ARM ARTERIOVENOUS FISTULA;  Surgeon: Nada Libman, MD;  [facility]: [facility] OR;  Service: Vascular;  Laterality: Left;   RIGHT HEART CATH N/A 07/24/2017   Procedure: RIGHT HEART CATH;  Surgeon: Dolores Patty, MD;  [facility]: [facility] INVAS[facility] CV LAB;  Service: Cardiovascular;  Laterality: N/A;   RIGHT HEART CATHETERIZATION N/A 09/22/2013   Procedure: RIGHT HEART CATH;  Surgeon: Dolores Patty, MD;  Location: [facility] CATH LAB;  Service: Cardiovascular;  Laterality: N/A;   SHOULDER ARTHROSCOPY WITH OPEN ROTATOR CUFF REPAIR Right 03/14/2014   Procedure: RIGHT SHOULDER ARTHROSCOPY WITH BICEPS RELEASE, OPEN SUBSCAPULA REPAIR, OPEN SUPRASPINATUS REPAIR.;  Surgeon: Cammy Copa, MD;  Location: [facility] OR;  Service: Orthopedics;  Laterality: Right;   SPHINCTEROTOMY  02/03/2022   Procedure: SPHINCTEROTOMY;  Surgeon: Jeani Hawking, MD;  Location: [facility]  ENDOSCOPY;  Service: Gastroenterology;;   STUMP REVISION Right 06/13/2022   Procedure: REVISION RIGHT BELOW KNEE AMPUTATION;  Surgeon: Nadara Mustard, MD;  Location: [facility] OR;  Service: Orthopedics;  Laterality: Right;   TEE WITHOUT CARDIOVERSION N/A 02/04/2022   Procedure: TRANSESOPHAGEAL ECHOCARDIOGRAM (TEE);  Surgeon: Dolores Patty, MD;  Location: [facility] ENDOSCOPY;  Service: Cardiovascular;  Laterality: N/A;   THROMBECTOMY W/ EMBOLECTOMY Left 08/20/2022   Procedure: THROMBECTOMY OF LEFT ARM ARTERIOVENOUS FISTULA;  Surgeon: Nada Libman, MD;  Location: [facility] OR;  Service: Vascular;  Laterality: Left;   TOE AMPUTATION Right 02/24/2018   GREAT TOE AND 2ND TOE AMPUTATION   TUBAL LIGATION  1970's     Social History   Tobacco Use   Smoking status: Former    Packs/day: 3.00    Years: 32.00    Additional pack years: 0.00    Total pack years: 96.00    Types: Cigarettes    Quit date: 10/24/1997    Years since quitting: 25.0   Smokeless tobacco: Never  Vaping Use   Vaping Use: Never used  Substance Use Topics   Alcohol use: Not Currently    Comment: "might have 2-3 daiquiris in the summer"   Drug use: No      Review of Systems  A complete ROS was performed with pertinent positives/negatives noted in the HPI.   Vitals:   11/10/22 1234  BP: (!) 122/49  Pulse: 70  Resp: 16  Temp: 98 F (36.7 C)  SpO2: 95%    Physical Exam Vitals and nursing note reviewed.  Constitutional:      General: She is not in acute distress.    Appearance: She is well-developed. She is ill-appearing.  HENT:     Head: Normocephalic and atraumatic.  Cardiovascular:     Heart sounds: No murmur heard.    No friction rub. No gallop.  Pulmonary:     Effort: Pulmonary effort is normal. No respiratory distress.  Abdominal:     General: Abdomen is flat. There is no distension.  Musculoskeletal:        General: No swelling.     Cervical back: Neck supple.  Skin:    General: Skin is warm and dry.      Capillary Refill: Capillary refill takes less than 2 seconds.     Comments: Wound as imaged below  Neurological:     Mental Status: She is alert.  Psychiatric:        Mood and Affect: Mood normal.        Procedures  MDM:  Imaging/radiology results:  No results found.    EKG results: ECG  on my interpretation is normal sinus rhythm and rate, without anatomical ischemia representing STEMI, New-onset Arrhythmia, or ischemic equivalent.     Lab results: *** Results for orders placed or performed during the hospital encounter of 11/10/22 (from the past 24 hour(s))  Comprehensive metabolic panel     Status: Abnormal   Collection Time: 11/10/22  1:45 PM  Result Value Ref Range   Sodium 129 (L) 135 - 145 mmol/L   Potassium 3.8 3.5 - 5.1 mmol/L   Chloride 93 (L) 98 - 111 mmol/L   CO2 20 (L) 22 - 32 mmol/L   Glucose, Bld 334 (H) 70 - 99 mg/dL   BUN 56 (H) 8 - 23 mg/dL   Creatinine, Ser 1.61 (H) 0.44 - 1.00 mg/dL   Calcium 8.9 8.9 - 09.6 mg/dL   Total Protein 6.5 6.5 - 8.1 g/dL   Albumin 3.0 (L) 3.5 - 5.0 g/dL   AST 30 15 - 41 U/L   ALT 19 0 - 44 U/L   Alkaline Phosphatase 201 (H) 38 - 126 U/L   Total Bilirubin 0.9 0.3 - 1.2 mg/dL   GFR, Estimated 11 (L) >60 mL/min   Anion gap 16 (H) 5 - 15  CBC with Differential     Status: Abnormal   Collection Time: 11/10/22  1:45 PM  Result Value Ref Range   WBC 7.0 4.0 - 10.5 K/uL   RBC 4.55 3.87 - 5.11 MIL/uL   Hemoglobin 11.0 (L) 12.0 - 15.0 g/dL   HCT 04.5 40.9 - 81.1 %   MCV 80.2 80.0 - 100.0 fL   MCH 24.2 (L) 26.0 - 34.0 pg   MCHC 30.1 30.0 - 36.0 g/dL   RDW 91.4 (H) 78.2 - 95.6 %   Platelets 125 (L) 150 - 400 K/uL   nRBC 0.0 0.0 - 0.2 %   Neutrophils Relative % 79 %   Neutro Abs 5.5 1.7 - 7.7 K/uL   Lymphocytes Relative 10 %   Lymphs Abs 0.7 0.7 - 4.0 K/uL   Monocytes Relative 7 %   Monocytes Absolute 0.5 0.1 - 1.0 K/uL   Eosinophils Relative 4 %   Eosinophils Absolute 0.3 0.0 - 0.5 K/uL   Basophils Relative 0 %    Basophils Absolute 0.0 0.0 - 0.1 K/uL   Immature Granulocytes 0 %   Abs Immature Granulocytes 0.03 0.00 - 0.07 K/uL  Lactic acid     Status: None   Collection Time: 11/10/22  2:00 PM  Result Value Ref Range   Lactic Acid, Venous 1.1 0.5 - 1.9 mmol/L   *Note: Due to a large number of results and/or encounters for the requested time period, some results have not been displayed. A complete set of results can be found in Results Review.     Key medications administered in the ER: *** Medications - No data to display  Consults: ***    Click here for ABCD2, HEART and other calculatorsREFRESH Note before signing   Medical decision making: -Vital signs stable.*** Patient afebrile***, hemodynamically stable***, and non-toxic appearing.*** -Patient's presentation is most consistent with {EM COPA:27473}. -LANEY BOEKER is a 72 y.o. female presenting to the emergency department with ***.  -Additional history obtained from *** -Per chart review, ***   Medical Decision Making Risk Prescription drug management. Decision regarding hospitalization.     The plan for this patient was discussed with Dr. ***, who voiced agreement and who oversaw evaluation and treatment of this patient.  Marta Lamas, MD Emergency Medicine,  PGY-3  Note: Dragon medical dictation software was used in the creation of this note.   Clinical Impression: No diagnosis found.  Rx / DC Orders ED Discharge Orders     None

## 2022-11-10 NOTE — H&P (Signed)
History and Physical    Patient: Susan [name]:[id number] DOB: [birth year] DOA: 11/10/2022 DOS: the patient was seen and examined on 11/10/2022 PCP: Donita Brooks, MD  Patient coming from: Home - lives with husband; NOK: [place], [place], [phone]   Chief Complaint: leg wound  HPI: Susan [name] is a 72 y.o. female with medical history significant of CAD, chronic diastolic CHF, HLD, TTS HD, HTN, PVD s/p R BKA, hypothyroidism, morbid obesity, afib, and DM presenting with maggot infestation of her LLE.  She developed a RLE ulcer in 03/2022 and had BKA in 04/2022 with revision in 05/2022.  She went to SNF rehab after but was unable to bear weight on her L leg the one time they tried (by her description) and so eventually went home without ability to support her weight or transfer at all.  She has had sacral pressure ulcers as a result.  She was started on HD but has had fistula issues and so is still using a TDC.  She most recently underwent fistulogram on 6/14 with thrombosis; she is recommended to f/u with vascular in 4 weeks to discuss access.  Her glucose is very poorly controlled.  Meanwhile, about a month ago she had acute onset of lateral leg ulcer on her L lower leg.  She was seen by Dr. Lajoyce Corners for this on 5/13 and had compression wrap placed.  She was seen again on 5/20 and had 2 x 2 cm ulcer with granulation tissue around the wound edges.  At the next visit on 6/4, the ulcer was 3 x 5 cm with superficial eschar and she was planned for [facility] tissue graft, which was placed on 6/10 after the eschar was removed.  Over the weekend, on 6/15, she noticed maggot infestation of the wound.  No fevers, no systemic symptoms.  She was seen by Dr. Lajoyce Corners today and she was sent to the ER for surgical debridement later this week.    ER Course:  Patient coming from ortho clinic - maggots cleaned, wrapped in bandage.  Likely washout of L leg in a few days, may be nearing need for L amputation  too.  Has ESRD, needs HD tomorrow.     Review of Systems: As mentioned in the history of present illness. All other systems reviewed and are negative. Past Medical History:  Diagnosis Date   Anemia    hx   Anxiety    Arthritis    "generalized" (03/15/2014)   CAD (coronary artery disease)    MI in 2000 - MI  2007 - treated bare metal stent (no nuclear since then as 9/11)   Carotid artery disease (HCC)    Chronic diastolic heart failure (HCC)    a) ECHO (08/2013) EF 55-60% and RV function nl b) RHC (08/2013) RA 4, RV 30/5/7, PA 25/10 (16), PCWP 7, Fick CO/CI 6.3/2.7, PVR 1.5 WU, PA 61 and 66%   Daily headache    "~ every other day; since I fell in June" (03/15/2014)   Depression    Dyslipidemia    ESRD (end stage renal disease) (HCC)    Dialysis on Tues Thurs Sat   Exertional shortness of breath    History of kidney stones    HTN (hypertension)    Hypothyroidism    Obesity    Osteoarthritis    PAF (paroxysmal atrial fibrillation) (HCC)    Peripheral neuropathy    bilateral feet/hands   PONV (postoperative nausea and vomiting)    RBBB (right  bundle branch block)    Old   Stroke (HCC)    mini strokes   Type II diabetes mellitus (HCC)    Type II, Freesyle libre left upper arm. patient has omnipod insulin pump with Novolin R Insulin   Past Surgical History:  Procedure Laterality Date   A/V FISTULAGRAM Left 11/07/2022   Procedure: A/V Fistulagram;  Surgeon: Leonie Douglas, MD;  [facility];  Service: Cardiovascular;  Laterality: Left;   ABDOMINAL HYSTERECTOMY  1980's   AMPUTATION Right 02/24/2018   Procedure: RIGHT FOOT GREAT TOE AND 2ND TOE AMPUTATION;  Surgeon: Nadara Mustard, MD;  Location: [facility] OR;  Service: Orthopedics;  Laterality: Right;   AMPUTATION Right 04/30/2018   Procedure: RIGHT TRANSMETATARSAL AMPUTATION;  Surgeon: Nadara Mustard, MD;  Location: [facility] OR;  Service: Orthopedics;  Laterality: Right;   AMPUTATION Right 05/02/2022   Procedure: RIGHT  BELOW KNEE AMPUTATION;  Surgeon: Nadara Mustard, MD;  Location: [facility] OR;  Service: Orthopedics;  Laterality: Right;   APPLICATION OF WOUND VAC Right 06/13/2022   Procedure: APPLICATION OF WOUND VAC;  Surgeon: Nadara Mustard, MD;  Location: [facility] OR;  Service: Orthopedics;  Laterality: Right;   AV FISTULA PLACEMENT Left 04/02/2022   Procedure: LEFT ARM ARTERIOVENOUS (AV) FISTULA CREATION;  Surgeon: Nada Libman, MD;  Location: [facility] OR;  Service: Vascular;  Laterality: Left;  PERIPHERAL NERVE BLOCK   BASCILIC VEIN TRANSPOSITION Left 07/31/2022   Procedure: LEFT ARM SECOND STAGE BASILIC VEIN TRANSPOSITION;  Surgeon: Nada Libman, MD;  [facility]: [facility] OR;  Service: Vascular;  Laterality: Left;   BIOPSY  05/27/2020   Procedure: BIOPSY;  Surgeon: Lanelle Bal, DO;  [facility]: [facility];  Service: Endoscopy;;   CATARACT EXTRACTION, BILATERAL Bilateral ?2013   COLONOSCOPY W/ POLYPECTOMY     COLONOSCOPY WITH PROPOFOL N/A 03/13/2019   Procedure: COLONOSCOPY WITH PROPOFOL;  Surgeon: Beverley Fiedler, MD;  Location: [facility] ENDOSCOPY;  Service: Gastroenterology;  Laterality: N/A;   CORONARY ANGIOPLASTY WITH STENT PLACEMENT  1999; 2007   "1 + 1"   ERCP N/A 02/03/2022   Procedure: ENDOSCOPIC RETROGRADE CHOLANGIOPANCREATOGRAPHY (ERCP);  Surgeon: Jeani Hawking, MD;  Location: [facility] ENDOSCOPY;  Service: Gastroenterology;  Laterality: N/A;   ESOPHAGOGASTRODUODENOSCOPY (EGD) WITH PROPOFOL N/A 03/13/2019   Procedure: ESOPHAGOGASTRODUODENOSCOPY (EGD) WITH PROPOFOL;  Surgeon: Beverley Fiedler, MD;  Location: [facility] ENDOSCOPY;  Service: Gastroenterology;  Laterality: N/A;   ESOPHAGOGASTRODUODENOSCOPY (EGD) WITH PROPOFOL N/A 05/27/2020   Procedure: ESOPHAGOGASTRODUODENOSCOPY (EGD) WITH PROPOFOL;  Surgeon: Lanelle Bal, DO;  [facility];  Service: Endoscopy;  Laterality: N/A;   ESOPHAGOGASTRODUODENOSCOPY (EGD) WITH PROPOFOL N/A 09/03/2022   Procedure: ESOPHAGOGASTRODUODENOSCOPY (EGD) WITH PROPOFOL;  Surgeon: Jeani Hawking,  MD;  Location: [facility] ENDOSCOPY;  Service: Gastroenterology;  Laterality: N/A;   EYE SURGERY Bilateral    lazer   FLEXIBLE SIGMOIDOSCOPY N/A 05/23/2022   Procedure: FLEXIBLE SIGMOIDOSCOPY;  Surgeon: Jeani Hawking, MD;  Location: [facility] ENDOSCOPY;  Service: Gastroenterology;  Laterality: N/A;   FLEXIBLE SIGMOIDOSCOPY N/A 05/24/2022   Procedure: FLEXIBLE SIGMOIDOSCOPY;  Surgeon: Imogene Burn, MD;  Location: [facility] ENDOSCOPY;  Service: Gastroenterology;  Laterality: N/A;   FLEXIBLE SIGMOIDOSCOPY N/A 09/03/2022   Procedure: FLEXIBLE SIGMOIDOSCOPY;  Surgeon: Jeani Hawking, MD;  Location: [facility] ENDOSCOPY;  Service: Gastroenterology;  Laterality: N/A;   HEMOSTASIS CLIP PLACEMENT  03/13/2019   Procedure: HEMOSTASIS CLIP PLACEMENT;  Surgeon: Beverley Fiedler, MD;  Location: [facility] ENDOSCOPY;  Service: Gastroenterology;;   HEMOSTASIS CLIP PLACEMENT  05/23/2022   Procedure:  HEMOSTASIS CLIP PLACEMENT;  Surgeon: Jeani Hawking, MD;  Location: [facility] ENDOSCOPY;  Service: Gastroenterology;;   HEMOSTASIS CONTROL  05/24/2022   Procedure: HEMOSTASIS CONTROL;  Surgeon: Imogene Burn, MD;  Location: [facility] ENDOSCOPY;  Service: Gastroenterology;;   HOT HEMOSTASIS N/A 05/23/2022   Procedure: HOT HEMOSTASIS (ARGON PLASMA COAGULATION/BICAP);  Surgeon: Jeani Hawking, MD;  Location: [facility] ENDOSCOPY;  Service: Gastroenterology;  Laterality: N/A;   I & D EXTREMITY Left 05/05/2022   Procedure: IRRIGATION AND DEBRIDEMENT LEFT ARM AV FISTULA;  Surgeon: Cephus Shelling, MD;  [facility] OR;  Service: Vascular;  Laterality: Left;   INSERTION OF DIALYSIS CATHETER Right 04/02/2022   Procedure: INSERTION OF TUNNELED DIALYSIS CATHETER;  Surgeon: Nada Libman, MD;  Location: [facility] OR;  Service: Vascular;  Laterality: Right;   KNEE ARTHROSCOPY Left 10/25/2006   POLYPECTOMY  03/13/2019   Procedure: POLYPECTOMY;  Surgeon: Beverley Fiedler, MD;  [facility] ENDOSCOPY;  Service: Gastroenterology;;   REMOVAL OF STONES  02/03/2022   Procedure: REMOVAL OF STONES;   Surgeon: Jeani Hawking, MD;  Location: [facility] ENDOSCOPY;  Service: Gastroenterology;;   REVISON OF ARTERIOVENOUS FISTULA Left 08/20/2022   Procedure: REVISON OF LEFT ARM ARTERIOVENOUS FISTULA;  Surgeon: Nada Libman, MD;  [facility] OR;  Service: Vascular;  Laterality: Left;   RIGHT HEART CATH N/A 07/24/2017   Procedure: RIGHT HEART CATH;  Surgeon: Dolores Patty, MD;  [facility];  Service: Cardiovascular;  Laterality: N/A;   RIGHT HEART CATHETERIZATION N/A 09/22/2013   Procedure: RIGHT HEART CATH;  Surgeon: Dolores Patty, MD;  Location: [facility] CATH LAB;  Service: Cardiovascular;  Laterality: N/A;   SHOULDER ARTHROSCOPY WITH OPEN ROTATOR CUFF REPAIR Right 03/14/2014   Procedure: RIGHT SHOULDER ARTHROSCOPY WITH BICEPS RELEASE, OPEN SUBSCAPULA REPAIR, OPEN SUPRASPINATUS REPAIR.;  Surgeon: Cammy Copa, MD;  Location: [facility] OR;  Service: Orthopedics;  Laterality: Right;   SPHINCTEROTOMY  02/03/2022   Procedure: SPHINCTEROTOMY;  Surgeon: Jeani Hawking, MD;  Location: [facility] ENDOSCOPY;  Service: Gastroenterology;;   STUMP REVISION Right 06/13/2022   Procedure: REVISION RIGHT BELOW KNEE AMPUTATION;  Surgeon: Nadara Mustard, MD;  Location: [facility] OR;  Service: Orthopedics;  Laterality: Right;   TEE WITHOUT CARDIOVERSION N/A 02/04/2022   Procedure: TRANSESOPHAGEAL ECHOCARDIOGRAM (TEE);  Surgeon: Dolores Patty, MD;  Location: [facility] ENDOSCOPY;  Service: Cardiovascular;  Laterality: N/A;   THROMBECTOMY W/ EMBOLECTOMY Left 08/20/2022   Procedure: THROMBECTOMY OF LEFT ARM ARTERIOVENOUS FISTULA;  Surgeon: Nada Libman, MD;  [facility] OR;  Service: Vascular;  Laterality: Left;   TOE AMPUTATION Right 02/24/2018   GREAT TOE AND 2ND TOE AMPUTATION   TUBAL LIGATION  1970's   Social History:  reports that she quit smoking about 25 years ago. Her smoking use included cigarettes. She has a 96.00 pack-year smoking history. She has never used smokeless tobacco. She reports that she does not  currently use alcohol. She reports that she does not use drugs.  Allergies  Allergen Reactions   Cephalexin Diarrhea and Other (See Comments)   Codeine Nausea And Vomiting and Other (See Comments)    Family History  Problem Relation Age of Onset   Heart attack Mother 5    Prior to Admission medications   Medication Sig Start Date End Date Taking? Authorizing Provider  albuterol (VENTOLIN HFA) 108 (90 Base) MCG/ACT inhaler Inhale 1-2 puffs into the lungs every 6 (six) hours as needed for wheezing or shortness of breath.    [provider]  amiodarone (PACERONE)  200 MG tablet Take 200 mg by mouth 2 (two) times daily.    [provider]  apixaban (ELIQUIS) 5 MG TABS tablet Take 1 tablet (5 mg total) by mouth 2 (two) times daily. 09/05/22   Leroy Sea, MD  atorvastatin (LIPITOR) 80 MG tablet Take 1 tablet (80 mg total) by mouth daily. 09/05/22   Leroy Sea, MD  blood glucose meter kit and supplies Use up to four times daily as directed. 01/23/22   Ghimire, Werner Lean, MD  Continuous Blood Gluc Sensor (FREESTYLE LIBRE 2 SENSOR) MISC  01/14/22   [provider]  diphenoxylate-atropine (LOMOTIL) 2.5-0.025 MG tablet Take 1 tablet by mouth 4 (four) times daily as needed for diarrhea or loose stools. 08/19/21   Donita Brooks, MD  ferrous sulfate 325 (65 FE) MG tablet Take 325 mg by mouth 2 (two) times daily. (1000 & 2100)    [provider]  Fingerstix Lancets MISC Use as directed to check blood sugars as need up to 4 times daily 01/23/22   Ghimire, Werner Lean, MD  folic acid (FOLVITE) 1 MG tablet Take 1 mg by mouth daily.    [provider]  hydrocortisone (ANUSOL-HC) 2.5 % rectal cream Place 1 Application rectally 2 (two) times daily. (1000 & 2100)    [provider]  insulin aspart (NOVOLOG FLEXPEN) 100 UNIT/ML FlexPen Inject 0-15 Units into the skin See admin instructions. Inject 12 units subcutaneously 3 times daily with meals  (0900, 1300 & 1700) & as needed via sliding scale insulin CBG <70=Notify NP/MD 71-149=0 units,150-199=4 units, 200-249=6 units,250- 299=10 units, 300-349=12 units, 350-399=15 units, > 400 call MD ( prime pen with 2 units prior  to set dose) 09/05/22   Leroy Sea, MD  insulin glargine (LANTUS SOLOSTAR) 100 UNIT/ML Solostar Pen Inject 16 Units into the skin 2 (two) times daily. 09/05/22   Leroy Sea, MD  Insulin Pen Needle 32G X 4 MM MISC Use as directed 01/23/22   Maretta Bees, MD  levothyroxine (SYNTHROID) 75 MCG tablet Take 75 mcg by mouth daily before breakfast.    [provider]  linagliptin (TRADJENTA) 5 MG TABS tablet Take 1 tablet (5 mg total) by mouth daily. 04/07/22   Rhetta Mura, MD  LORazepam (ATIVAN) 0.5 MG tablet Take 1 tablet (0.5 mg total) by mouth at bedtime. 09/29/22   Donita Brooks, MD  metoCLOPramide (REGLAN) 10 MG tablet Take 1 tablet (10 mg total) by mouth every 6 (six) hours as needed for nausea or vomiting. 09/08/22   Peter Garter, PA  midodrine (PROAMATINE) 10 MG tablet Take 10 mg by mouth every Monday, Wednesday, and Friday with hemodialysis.    [provider]  midodrine (PROAMATINE) 5 MG tablet Take 5 mg by mouth See admin instructions. Take 1 tablet (5 mg) by mouth with meals on Tuesdays, Thursdays, Saturdays & Sundays.    [provider]  Nutritional Supplements (FEEDING SUPPLEMENT, NEPRO CARB STEADY,) LIQD Take 237 mLs by mouth daily. (0900)    [provider]  nystatin (MYCOSTATIN) 100000 UNIT/ML suspension Use as directed 5 mLs (500,000 Units total) in the mouth or throat 4 (four) times daily. 10/10/22   Donita Brooks, MD  ondansetron (ZOFRAN) 4 MG tablet Take 4 mg by mouth every 8 (eight) hours as needed for nausea.    [provider]  oxyCODONE-acetaminophen (PERCOCET/ROXICET) 5-325 MG tablet Take 1 tablet by mouth every 6 (six) hours as needed. 08/20/22   Baglia,  Corrina, PA-C  OXYGEN Inhale  2 L/min into the lungs at bedtime.    [provider]  pantoprazole (PROTONIX) 40 MG tablet Take 1 tablet (40 mg total) by mouth 2 (two) times daily before a meal. 09/05/22 09/05/23  Leroy Sea, MD  polyethylene glycol (MIRALAX / GLYCOLAX) 17 g packet Take 17 g by mouth 2 (two) times daily. Patient taking differently: Take 17 g by mouth daily as needed (constipation.). 06/17/22   [place], Alvira Philips, DO  pregabalin (LYRICA) 75 MG capsule Take 1 capsule (75 mg total) by mouth daily. 09/05/22   Leroy Sea, MD  senna-docusate (SENOKOT-S) 8.6-50 MG tablet Take 2 tablets by mouth 2 (two) times daily. 06/17/22   [place], Alvira Philips, DO  sevelamer carbonate (RENVELA) 800 MG tablet Take 1,600 mg by mouth 3 (three) times daily with meals. (0800, 1200 & 1700)    [provider]  vortioxetine HBr (TRINTELLIX) 20 MG TABS tablet Take 1 tablet (20 mg total) by mouth in the morning. (1000) 08/11/22   Donita Brooks, MD    Physical Exam: Vitals:   11/10/22 1234  BP: (!) 122/49  Pulse: 70  Resp: 16  Temp: 98 F (36.7 C)  SpO2: 95%   General:  Appears calm and comfortable and is in NAD Eyes:   EOMI, normal lids, iris ENT:  grossly normal hearing, lips & tongue, mmm Neck:  no LAD, masses or thyromegaly Cardiovascular:  RRR, no m/r/g. No LE edema.  Respiratory:   CTA bilaterally with no wheezes/rales/rhonchi.  Normal respiratory effort. Abdomen:  soft, NT, ND Skin:  superficial-appearing wound of L lateral lower leg with maggots noted     Musculoskeletal:  s/p R BKA Psychiatric:  grossly normal mood and affect, speech fluent and appropriate, AOx3 Neurologic:  CN 2-12 grossly intact, moves all extremities in coordinated fashion   Radiological Exams on Admission: Independently reviewed - see discussion in A/P where applicable  No results found.  EKG: Independently reviewed.  NSR with rate 78; prolonged QTc 515; RBBB; nonspecific ST changes with no evidence of acute  ischemia   Labs on Admission: I have personally reviewed the available labs and imaging studies at the time of the admission.  Pertinent labs:    Na++ 129 CO2 20 Glucose 334 BUN 56/Creatinine 4.03/GFR 11 AP 201 Albumin 3.1 Lactate 1.1 WBC 7 Hgb 11 Platelets 125 Blood cultures x 2 pending   Assessment and Plan: Principal Problem:   Complicated wound infection Active Problems:   Type 2 diabetes mellitus with diabetic polyneuropathy, with long-term current use of insulin (HCC)   ESRD on dialysis (HCC)   Paroxysmal atrial fibrillation (HCC)   Hyperlipemia   Hypotension   Impaired ambulation   Hypothyroidism   Prolonged QT interval   Obesity, Class III, BMI 40-49.9 (morbid obesity) (HCC)   S/P BKA (below knee amputation) unilateral, right (HCC)    LLE Wound Infection -Patient with prior RLE BKA now presenting with refractory L lateral lower leg ulcer with maggot infestation -Patient has failed outpatient management, seeing Dr. Lajoyce Corners weekly -Negative lactate, no current concerns for sepsis -Will treat with IV antibiotics (Zosyn/Vanc) -Orthopedics will consult -I have ordered ABIs in case revascularization may be indicated -LE wound order set utilized including labs (CRP, ESR, A1c, prealbumin, HIV, and blood cultures) and consults (diabetes coordinator; peripheral vascular navigator; TOC team; wound care; and nutrition)   ESRD on HD -Patient on chronic TTS HD -Nephrology prn order set utilized -She does not appear to  be volume overloaded or otherwise in need of acute HD -Nephrology is notified that patient will need HD  -Still having access issues so has TDC with planned outpatient f/u in 3-4 weeks  DM -A1c 7.9, suboptimal control -Continue glargine -Cover with very sensitive-scale SSI   Afib -Rate controlled with amiodarone -Continue Eilquis for now unless orthopedics requests otherwise  H/o Right renal infarct, splenic infarct  -In the setting of underlying  A-fib.   -Anticoagulation with Eliquis   Hypothyroidism  -Continue Synthroid   Hypotension  -Continue midodrine  PVD -s/p R BKA -ABIs ordered to assess for RLE vascular compromise -Continue atorvastatin  Sacral pressure ulcer -Wound care consulted -Husband reports that she has had multiple ulcers and this has been difficult since she is fully non-ambulatory   Morbid obesity -BMI 41.77 -Weight loss should be encouraged -Outpatient PCP/bariatric medicine f/u encouraged   Ambulatory dysfunction -She has not been able to even bear weight to assist with transfers since BKA -She is being fitted for prosthesis -She is likely to benefit from inpatient rehab if at all possible - 3 hours of rehab daily is her best bet to get more mobile -Suggest CIR consult post-operatively  Prolonged QTc -Will attempt to avoid QT-prolonging medications such as PPI, nausea meds, SSRIs -Repeat EKG in AM    Advance Care Planning:   Code Status: Full Code - Code status was discussed with the patient and her husband at the time of admission.  The patient would want to receive full resuscitative measures at this time.   Consults: Orthopedics; diabetes coordinator; peripheral vascular navigator; TOC team; wound care; and nutrition  DVT Prophylaxis: Eliquis  Family Communication: Husband was present throughout evaluation  Severity of Illness: The appropriate patient status for this patient is INPATIENT. Inpatient status is judged to be reasonable and necessary in order to provide the required intensity of service to ensure the patient's safety. The patient's presenting symptoms, physical exam findings, and initial radiographic and laboratory data in the context of their chronic comorbidities is felt to place them at high risk for further clinical deterioration. Furthermore, it is not anticipated that the patient will be medically stable for discharge from the hospital within 2 midnights of admission.   *  I certify that at the point of admission it is my clinical judgment that the patient will require inpatient hospital care spanning beyond 2 midnights from the point of admission due to high intensity of service, high risk for further deterioration and high frequency of surveillance required.*  Author: Jonah Blue, MD 11/10/2022 6:05 PM  For on call review [email or web address].

## 2022-11-11 ENCOUNTER — Encounter (HOSPITAL_COMMUNITY): Payer: PPO

## 2022-11-11 DIAGNOSIS — E11628 Type 2 diabetes mellitus with other skin complications: Secondary | ICD-10-CM | POA: Diagnosis not present

## 2022-11-11 DIAGNOSIS — L089 Local infection of the skin and subcutaneous tissue, unspecified: Secondary | ICD-10-CM | POA: Diagnosis not present

## 2022-11-11 DIAGNOSIS — T148XXA Other injury of unspecified body region, initial encounter: Secondary | ICD-10-CM | POA: Diagnosis not present

## 2022-11-11 DIAGNOSIS — L97229 Non-pressure chronic ulcer of left calf with unspecified severity: Secondary | ICD-10-CM | POA: Diagnosis not present

## 2022-11-11 DIAGNOSIS — F332 Major depressive disorder, recurrent severe without psychotic features: Secondary | ICD-10-CM | POA: Diagnosis not present

## 2022-11-11 DIAGNOSIS — F411 Generalized anxiety disorder: Secondary | ICD-10-CM

## 2022-11-11 DIAGNOSIS — L89152 Pressure ulcer of sacral region, stage 2: Secondary | ICD-10-CM | POA: Diagnosis not present

## 2022-11-11 DIAGNOSIS — E1152 Type 2 diabetes mellitus with diabetic peripheral angiopathy with gangrene: Secondary | ICD-10-CM | POA: Diagnosis not present

## 2022-11-11 LAB — CBC
HCT: 28.2 % — ABNORMAL LOW (ref 36.0–46.0)
Hemoglobin: 8.6 g/dL — ABNORMAL LOW (ref 12.0–15.0)
MCH: 23.6 pg — ABNORMAL LOW (ref 26.0–34.0)
MCHC: 30.5 g/dL (ref 30.0–36.0)
MCV: 77.5 fL — ABNORMAL LOW (ref 80.0–100.0)
Platelets: 138 10*3/uL — ABNORMAL LOW (ref 150–400)
RBC: 3.64 MIL/uL — ABNORMAL LOW (ref 3.87–5.11)
RDW: 16.4 % — ABNORMAL HIGH (ref 11.5–15.5)
WBC: 6.7 10*3/uL (ref 4.0–10.5)
nRBC: 0 % (ref 0.0–0.2)

## 2022-11-11 LAB — BASIC METABOLIC PANEL
Anion gap: 16 — ABNORMAL HIGH (ref 5–15)
BUN: 65 mg/dL — ABNORMAL HIGH (ref 8–23)
CO2: 21 mmol/L — ABNORMAL LOW (ref 22–32)
Calcium: 8.4 mg/dL — ABNORMAL LOW (ref 8.9–10.3)
Chloride: 93 mmol/L — ABNORMAL LOW (ref 98–111)
Creatinine, Ser: 4.12 mg/dL — ABNORMAL HIGH (ref 0.44–1.00)
GFR, Estimated: 11 mL/min — ABNORMAL LOW (ref >60)
Glucose, Bld: 486 mg/dL — ABNORMAL HIGH (ref 70–99)
Potassium: 3.2 mmol/L — ABNORMAL LOW (ref 3.5–5.1)
Sodium: 130 mmol/L — ABNORMAL LOW (ref 135–145)

## 2022-11-11 LAB — GLUCOSE, CAPILLARY
Glucose-Capillary: 423 mg/dL — ABNORMAL HIGH (ref 70–99)
Glucose-Capillary: 281 mg/dL — ABNORMAL HIGH (ref 70–99)
Glucose-Capillary: 212 mg/dL — ABNORMAL HIGH (ref 70–99)
Glucose-Capillary: 158 mg/dL — ABNORMAL HIGH (ref 70–99)

## 2022-11-11 LAB — CULTURE, BLOOD (ROUTINE X 2)

## 2022-11-11 LAB — HEPATITIS B SURFACE ANTIGEN: Hepatitis B Surface Ag: NONREACTIVE

## 2022-11-11 MED ORDER — INSULIN GLARGINE-YFGN 100 UNIT/ML ~~LOC~~ SOLN
30.0000 [IU] | Freq: Two times a day (BID) | SUBCUTANEOUS | Status: DC
  Administered 2022-11-11 – 2022-11-14 (×7): 30 [IU] via SUBCUTANEOUS
  Filled 2022-11-11 (×10): qty 0.3

## 2022-11-11 MED ORDER — MELATONIN 3 MG PO TABS
3.0000 mg | ORAL_TABLET | Freq: Every day | ORAL | Status: DC
  Administered 2022-11-11 – 2022-11-13 (×3): 3 mg via ORAL
  Filled 2022-11-11 (×3): qty 1

## 2022-11-11 MED ORDER — VORTIOXETINE HBR 5 MG PO TABS
10.0000 mg | ORAL_TABLET | Freq: Every day | ORAL | Status: AC
  Administered 2022-11-15 – 2022-11-16 (×2): 10 mg via ORAL
  Filled 2022-11-11 (×3): qty 2

## 2022-11-11 MED ORDER — VITAMIN C 500 MG PO TABS
500.0000 mg | ORAL_TABLET | Freq: Every day | ORAL | Status: DC
  Administered 2022-11-11 – 2022-11-17 (×6): 500 mg via ORAL
  Filled 2022-11-11 (×9): qty 1

## 2022-11-11 MED ORDER — VENLAFAXINE HCL ER 37.5 MG PO CP24
37.5000 mg | ORAL_CAPSULE | Freq: Every day | ORAL | Status: AC
  Administered 2022-11-11 – 2022-11-13 (×3): 37.5 mg via ORAL
  Filled 2022-11-11 (×3): qty 1

## 2022-11-11 MED ORDER — INSULIN ASPART 100 UNIT/ML IJ SOLN
3.0000 [IU] | Freq: Three times a day (TID) | INTRAMUSCULAR | Status: DC
  Administered 2022-11-11: 3 [IU] via SUBCUTANEOUS

## 2022-11-11 MED ORDER — VENLAFAXINE HCL ER 75 MG PO CP24
75.0000 mg | ORAL_CAPSULE | Freq: Every day | ORAL | Status: AC
  Administered 2022-11-15: 75 mg via ORAL
  Filled 2022-11-11 (×2): qty 1

## 2022-11-11 MED ORDER — PREGABALIN 75 MG PO CAPS
150.0000 mg | ORAL_CAPSULE | Freq: Two times a day (BID) | ORAL | Status: DC
  Administered 2022-11-11 – 2022-11-18 (×14): 150 mg via ORAL
  Filled 2022-11-11 (×16): qty 2

## 2022-11-11 MED ORDER — CHLORHEXIDINE GLUCONATE CLOTH 2 % EX PADS
6.0000 | MEDICATED_PAD | Freq: Every day | CUTANEOUS | Status: DC
  Administered 2022-11-11 – 2022-11-16 (×4): 6 via TOPICAL

## 2022-11-11 MED ORDER — VANCOMYCIN HCL IN DEXTROSE 1-5 GM/200ML-% IV SOLN
1000.0000 mg | INTRAVENOUS | Status: DC
  Administered 2022-11-15: 1000 mg via INTRAVENOUS
  Filled 2022-11-11 (×2): qty 200

## 2022-11-11 MED ORDER — ZINC SULFATE 220 (50 ZN) MG PO CAPS
220.0000 mg | ORAL_CAPSULE | Freq: Every day | ORAL | Status: DC
  Administered 2022-11-11 – 2022-11-17 (×6): 220 mg via ORAL
  Filled 2022-11-11 (×9): qty 1

## 2022-11-11 MED ORDER — ANTICOAGULANT SODIUM CITRATE 4% (200MG/5ML) IV SOLN: 5.0000 mL | Status: DC | PRN

## 2022-11-11 MED ORDER — MIDODRINE HCL 5 MG PO TABS
10.0000 mg | ORAL_TABLET | ORAL | Status: DC
  Administered 2022-11-13 – 2022-11-19 (×5): 10 mg via ORAL
  Filled 2022-11-11 (×5): qty 2

## 2022-11-11 MED ORDER — INSULIN ASPART 100 UNIT/ML IJ SOLN
5.0000 [IU] | Freq: Once | INTRAMUSCULAR | Status: AC
  Administered 2022-11-11: 5 [IU] via SUBCUTANEOUS

## 2022-11-11 MED ORDER — ALTEPLASE 2 MG IJ SOLR: 2.0000 mg | Freq: Once | INTRAMUSCULAR | Status: DC | PRN

## 2022-11-11 MED ORDER — HEPARIN SODIUM (PORCINE) 1000 UNIT/ML IJ SOLN
INTRAMUSCULAR | Status: AC
  Filled 2022-11-11: qty 4

## 2022-11-11 MED ORDER — CHLORHEXIDINE GLUCONATE CLOTH 2 % EX PADS: 6.0000 | MEDICATED_PAD | Freq: Every day | CUTANEOUS | Status: DC

## 2022-11-11 MED ORDER — HEPARIN SODIUM (PORCINE) 1000 UNIT/ML DIALYSIS
1000.0000 [IU] | INTRAMUSCULAR | Status: DC | PRN
  Administered 2022-11-11: 1000 [IU]

## 2022-11-11 MED ORDER — PROSOURCE PLUS PO LIQD
30.0000 mL | Freq: Two times a day (BID) | ORAL | Status: DC
  Administered 2022-11-11 – 2022-11-18 (×12): 30 mL via ORAL
  Filled 2022-11-11 (×13): qty 30

## 2022-11-11 MED ORDER — CHLORHEXIDINE GLUCONATE CLOTH 2 % EX PADS
6.0000 | MEDICATED_PAD | Freq: Every day | CUTANEOUS | Status: DC
  Administered 2022-11-13 – 2022-11-19 (×7): 6 via TOPICAL

## 2022-11-11 NOTE — Progress Notes (Signed)
   11/11/22 1842  Vitals  BP (!) 137/52  MAP (mmHg) 78  BP Location Right Arm  BP Method Automatic  Patient Position (if appropriate) Lying  Pulse Rate 72  Pulse Rate Source Monitor  ECG Heart Rate 71  Resp 12  Oxygen Therapy  SpO2 100 %  O2 Device Nasal Cannula  O2 Flow Rate (L/min) 4 L/min  Pulse Oximetry Type Continuous  During Treatment Monitoring  Intra-Hemodialysis Comments See progress note (post rinse back)  Post Treatment  Dialyzer Clearance Lightly streaked  Duration of HD Treatment -hour(s) 3.5 hour(s)  Hemodialysis Intake (mL) 0 mL  Liters Processed 84  Fluid Removed (mL) 3 mL  Tolerated HD Treatment Yes  Post-Hemodialysis Comments Pt stable  Hemodialysis Catheter Right Subclavian  Placement Date: 06/06/22   Placed prior to admission: Yes  Orientation: Right  Access Location: Subclavian  Site Condition No complications  Blue Lumen Status Saline locked  Red Lumen Status Heparin locked  Catheter fill solution Heparin 1000 units/ml  Catheter fill volume (Arterial) 2 cc  Catheter fill volume (Venous) 2  Dressing Type Transparent  Dressing Status Clean, Dry, Intact  Interventions New dressing  Drainage Description None  Dressing Change Due 11/16/22  Post treatment catheter status Capped and Clamped   Pt treatment uneventful. 3.5 hours. net UF.

## 2022-11-11 NOTE — Consult Note (Signed)
[facility] Renal Consultation Note    Indication for Consultation:  Management of ESRD/hemodialysis; anemia, hypertension/volume and secondary hyperparathyroidism   HPI: Susan [name] is a 72 y.o. female with ESRD on HD, T2DM, HTN, CHF, HLD, PAD s/p R BKA. She is admitted with maggot infestation of her left lower extremity wound. She has been seeing Dr. Lajoyce Corners for her slow healing left leg ulcer - at yesterday's visit wound found to be infested and she was sent to the ED. Plan for IV antibiotics and surgical debridement later this week.  Dialyzing at [facility] TTS. Last dialysis 6/15. Due for dialysis today. Dialysis via [facility]. Thrombosed fistula, not salvageable.   She has been home from SNF for last few weeks. States her husband has been changing wound dressings daily. They noticed maggots on Saturday and were understandably alarmed. Denies fevers, chills, cp, dyspnea, nausea/vomiting.   Past Medical History:  Diagnosis Date   Anemia    hx   Anxiety    Arthritis    "generalized" (03/15/2014)   CAD (coronary artery disease)    MI in 2000 - MI  2007 - treated bare metal stent (no nuclear since then as 9/11)   Carotid artery disease (HCC)    Chronic diastolic heart failure (HCC)    a) ECHO (08/2013) EF 55-60% and RV function nl b) RHC (08/2013) RA 4, RV 30/5/7, PA 25/10 (16), PCWP 7, Fick CO/CI 6.3/2.7, PVR 1.5 WU, PA 61 and 66%   Daily headache    "~ every other day; since I fell in June" (03/15/2014)   Depression    Dyslipidemia    ESRD (end stage renal disease) (HCC)    Dialysis on Tues Thurs Sat   Exertional shortness of breath    History of kidney stones    HTN (hypertension)    Hypothyroidism    Obesity    Osteoarthritis    PAF (paroxysmal atrial fibrillation) (HCC)    Peripheral neuropathy    bilateral feet/hands   PONV (postoperative nausea and vomiting)    RBBB (right bundle branch block)    Old   Stroke (HCC)    mini strokes   Type II  diabetes mellitus (HCC)    Type II, [name]. patient has omnipod insulin pump with Novolin R Insulin   Past Surgical History:  Procedure Laterality Date   A/V FISTULAGRAM Left 11/07/2022   Procedure: A/V Fistulagram;  Surgeon: Leonie Douglas, MD;  [facility]: [facility] INVASIVE CV LAB;  Service: Cardiovascular;  Laterality: Left;   ABDOMINAL HYSTERECTOMY  1980's   AMPUTATION Right 02/24/2018   Procedure: RIGHT FOOT GREAT TOE AND 2ND TOE AMPUTATION;  Surgeon: Nadara Mustard, MD;  Location: [facility] OR;  Service: Orthopedics;  Laterality: Right;   AMPUTATION Right 04/30/2018   Procedure: RIGHT TRANSMETATARSAL AMPUTATION;  Surgeon: Nadara Mustard, MD;  Location[facility] OR;  Service: Orthopedics;  Laterality: Right;   AMPUTATION Right 05/02/2022   Procedure: RIGHT BELOW KNEE AMPUTATION;  Surgeon: Nadara Mustard, MD;  Location: [facility] OR;  Service: Orthopedics;  Laterality: Right;   APPLICATION OF WOUND VAC Right 06/13/2022   Procedure: APPLICATION OF WOUND VAC;  Surgeon: Nadara Mustard, MD;  Location: [facility] OR;  Service: Orthopedics;  Laterality: Right;   AV FISTULA PLACEMENT Left 04/02/2022   Procedure: LEFT ARM ARTERIOVENOUS (AV) FISTULA CREATION;  Surgeon: Nada Libman, MD;  Location: [facility] OR;  Service: Vascular;  Laterality: Left;  PERIPHERAL NERVE BLOCK   BASCILIC VEIN  TRANSPOSITION Left 07/31/2022   Procedure: LEFT ARM SECOND STAGE BASILIC VEIN TRANSPOSITION;  Surgeon: Nada Libman, MD;  Location: [facility] OR;  Service: Vascular;  Laterality: Left;   BIOPSY  05/27/2020   Procedure: BIOPSY;  Surgeon: Lanelle Bal, DO;  Location: [facility] ENDO SUITE;  Service: Endoscopy;;   CATARACT EXTRACTION, BILATERAL Bilateral ?2013   COLONOSCOPY W/ POLYPECTOMY     COLONOSCOPY WITH PROPOFOL N/A 03/13/2019   Procedure: COLONOSCOPY WITH PROPOFOL;  Surgeon: Beverley Fiedler, MD;  Location: [facility] ENDOSCOPY;  Service: Gastroenterology;  Laterality: N/A;   CORONARY ANGIOPLASTY WITH STENT PLACEMENT  1999; 2007   "1 + 1"   ERCP  N/A 02/03/2022   Procedure: ENDOSCOPIC RETROGRADE CHOLANGIOPANCREATOGRAPHY (ERCP);  Surgeon: Jeani Hawking, MD;  Location: [facility] ENDOSCOPY;  Service: Gastroenterology;  Laterality: N/A;   ESOPHAGOGASTRODUODENOSCOPY (EGD) WITH PROPOFOL N/A 03/13/2019   Procedure: ESOPHAGOGASTRODUODENOSCOPY (EGD) WITH PROPOFOL;  Surgeon: Beverley Fiedler, MD;  Location: [facility] ENDOSCOPY;  Service: Gastroenterology;  Laterality: N/A;   ESOPHAGOGASTRODUODENOSCOPY (EGD) WITH PROPOFOL N/A 05/27/2020   Procedure: ESOPHAGOGASTRODUODENOSCOPY (EGD) WITH PROPOFOL;  Surgeon: Lanelle Bal, DO;  [facility];  Service: Endoscopy;  Laterality: N/A;   ESOPHAGOGASTRODUODENOSCOPY (EGD) WITH PROPOFOL N/A 09/03/2022   Procedure: ESOPHAGOGASTRODUODENOSCOPY (EGD) WITH PROPOFOL;  Surgeon: Jeani Hawking, MD;  Location: [facility] ENDOSCOPY;  Service: Gastroenterology;  Laterality: N/A;   EYE SURGERY Bilateral    lazer   FLEXIBLE SIGMOIDOSCOPY N/A 05/23/2022   Procedure: FLEXIBLE SIGMOIDOSCOPY;  Surgeon: Jeani Hawking, MD;  Location: [facility] ENDOSCOPY;  Service: Gastroenterology;  Laterality: N/A;   FLEXIBLE SIGMOIDOSCOPY N/A 05/24/2022   Procedure: FLEXIBLE SIGMOIDOSCOPY;  Surgeon: Imogene Burn, MD;  Location: [facility] ENDOSCOPY;  Service: Gastroenterology;  Laterality: N/A;   FLEXIBLE SIGMOIDOSCOPY N/A 09/03/2022   Procedure: FLEXIBLE SIGMOIDOSCOPY;  Surgeon: Jeani Hawking, MD;  Location: [facility] ENDOSCOPY;  Service: Gastroenterology;  Laterality: N/A;   HEMOSTASIS CLIP PLACEMENT  03/13/2019   Procedure: HEMOSTASIS CLIP PLACEMENT;  Surgeon: Beverley Fiedler, MD;  Location: [facility] ENDOSCOPY;  Service: Gastroenterology;;   HEMOSTASIS CLIP PLACEMENT  05/23/2022   Procedure: HEMOSTASIS CLIP PLACEMENT;  Surgeon: Jeani Hawking, MD;  Location: [facility] ENDOSCOPY;  Service: Gastroenterology;;   HEMOSTASIS CONTROL  05/24/2022   Procedure: HEMOSTASIS CONTROL;  Surgeon: Imogene Burn, MD;  Location: [facility] ENDOSCOPY;  Service: Gastroenterology;;   HOT HEMOSTASIS N/A 05/23/2022    Procedure: HOT HEMOSTASIS (ARGON PLASMA COAGULATION/BICAP);  Surgeon: Jeani Hawking, MD;  Location: [facility] ENDOSCOPY;  Service: Gastroenterology;  Laterality: N/A;   I & D EXTREMITY Left 05/05/2022   Procedure: IRRIGATION AND DEBRIDEMENT LEFT ARM AV FISTULA;  Surgeon: Cephus Shelling, MD;  [facility] OR;  Service: Vascular;  Laterality: Left;   INSERTION OF DIALYSIS CATHETER Right 04/02/2022   Procedure: INSERTION OF TUNNELED DIALYSIS CATHETER;  Surgeon: Nada Libman, MD;  Location: [facility] OR;  Service: Vascular;  Laterality: Right;   KNEE ARTHROSCOPY Left 10/25/2006   POLYPECTOMY  03/13/2019   Procedure: POLYPECTOMY;  Surgeon: Beverley Fiedler, MD;  [facility] ENDOSCOPY;  Service: Gastroenterology;;   REMOVAL OF STONES  02/03/2022   Procedure: REMOVAL OF STONES;  Surgeon: Jeani Hawking, MD;  Location: [facility] ENDOSCOPY;  Service: Gastroenterology;;   REVISON OF ARTERIOVENOUS FISTULA Left 08/20/2022   Procedure: REVISON OF LEFT ARM ARTERIOVENOUS FISTULA;  Surgeon: Nada Libman, MD;  [facility] OR;  Service: Vascular;  Laterality: Left;   RIGHT HEART CATH N/A 07/24/2017   Procedure: RIGHT HEART CATH;  Surgeon: Dolores Patty, MD;  Location: [facility] INVASIVE CV  LAB;  Service: Cardiovascular;  Laterality: N/A;   RIGHT HEART CATHETERIZATION N/A 09/22/2013   Procedure: RIGHT HEART CATH;  Surgeon: Dolores Patty, MD;  Location: [facility] CATH LAB;  Service: Cardiovascular;  Laterality: N/A;   SHOULDER ARTHROSCOPY WITH OPEN ROTATOR CUFF REPAIR Right 03/14/2014   Procedure: RIGHT SHOULDER ARTHROSCOPY WITH BICEPS RELEASE, OPEN SUBSCAPULA REPAIR, OPEN SUPRASPINATUS REPAIR.;  Surgeon: Cammy Copa, MD;  Location: [facility] OR;  Service: Orthopedics;  Laterality: Right;   SPHINCTEROTOMY  02/03/2022   Procedure: SPHINCTEROTOMY;  Surgeon: Jeani Hawking, MD;  Location: [facility] ENDOSCOPY;  Service: Gastroenterology;;   STUMP REVISION Right 06/13/2022   Procedure: REVISION RIGHT BELOW KNEE AMPUTATION;  Surgeon: Nadara Mustard, MD;  Location: [facility] OR;  Service: Orthopedics;  Laterality: Right;   TEE WITHOUT CARDIOVERSION N/A 02/04/2022   Procedure: TRANSESOPHAGEAL ECHOCARDIOGRAM (TEE);  Surgeon: Dolores Patty, MD;  Location: [facility] ENDOSCOPY;  Service: Cardiovascular;  Laterality: N/A;   THROMBECTOMY W/ EMBOLECTOMY Left 08/20/2022   Procedure: THROMBECTOMY OF LEFT ARM ARTERIOVENOUS FISTULA;  Surgeon: Nada Libman, MD;  [facility] OR;  Service: Vascular;  Laterality: Left;   TOE AMPUTATION Right 02/24/2018   GREAT TOE AND 2ND TOE AMPUTATION   TUBAL LIGATION  1970's   Family History  Problem Relation Age of Onset   Heart attack Mother 57   Social History:  reports that she quit smoking about 25 years ago. Her smoking use included cigarettes. She has a 96.00 pack-year smoking history. She has never used smokeless tobacco. She reports that she does not currently use alcohol. She reports that she does not use drugs. Allergies  Allergen Reactions   Cephalexin Diarrhea and Other (See Comments)   Codeine Nausea And Vomiting and Other (See Comments)   Prior to Admission medications   Medication Sig Start Date End Date Taking? Authorizing Provider  albuterol (VENTOLIN HFA) 108 (90 Base) MCG/ACT inhaler Inhale 1-2 puffs into the lungs every 6 (six) hours as needed for wheezing or shortness of breath.   Yes [provider]  amiodarone (PACERONE) 200 MG tablet Take 200 mg by mouth daily.   Yes [provider]  apixaban (ELIQUIS) 5 MG TABS tablet Take 1 tablet (5 mg total) by mouth 2 (two) times daily. 09/05/22  Yes Leroy Sea, MD  atorvastatin (LIPITOR) 80 MG tablet Take 1 tablet (80 mg total) by mouth daily. 09/05/22  Yes Leroy Sea, MD  diphenoxylate-atropine (LOMOTIL) 2.5-0.025 MG tablet Take 1 tablet by mouth 4 (four) times daily as needed for diarrhea or loose stools. 08/19/21  Yes Donita Brooks, MD  ferrous sulfate 325 (65 FE) MG tablet Take 325 mg by mouth 2 (two) times daily  with a meal. (1000 & 2100)   Yes [provider]  hydrocortisone (ANUSOL-HC) 2.5 % rectal cream Place 1 Application rectally daily as needed for hemorrhoids. (1000 & 2100)   Yes [provider]  insulin aspart (NOVOLOG FLEXPEN) 100 UNIT/ML FlexPen Inject 0-15 Units into the skin See admin instructions. Inject 12 units subcutaneously 3 times daily with meals (0900, 1300 & 1700) & as needed via sliding scale insulin CBG <70=Notify NP/MD 71-149=0 units,150-199=4 units, 200-249=6 units,250- 299=10 units, 300-349=12 units, 350-399=15 units, > 400 call MD ( prime pen with 2 units prior  to set dose) Patient taking differently: Inject 0-16 Units into the skin See admin instructions. Inject 12 units subcutaneously 3 times daily with meals (0900, 1300 & 1700) & as needed via sliding scale insulin CBG <70=Notify NP/MD  71-149=0 units,150-199=4 units, 200-249=6 units,250- 299=10 units, 300-349=12 units, 350-399=15 units, > 400 call MD ( prime pen with 2 units prior  to set dose) 09/05/22  Yes Leroy Sea, MD  insulin glargine (LANTUS SOLOSTAR) 100 UNIT/ML Solostar Pen Inject 16 Units into the skin 2 (two) times daily. Patient taking differently: Inject 45 Units into the skin at bedtime. 09/05/22  Yes Leroy Sea, MD  levothyroxine (SYNTHROID) 75 MCG tablet Take 75 mcg by mouth daily before breakfast.   Yes [provider]  linagliptin (TRADJENTA) 5 MG TABS tablet Take 1 tablet (5 mg total) by mouth daily. 04/07/22  Yes Rhetta Mura, MD  LORazepam (ATIVAN) 0.5 MG tablet Take 1 tablet (0.5 mg total) by mouth at bedtime. 09/29/22  Yes Donita Brooks, MD  midodrine (PROAMATINE) 10 MG tablet Take 10 mg by mouth daily.   Yes [provider]  Nutritional Supplements (FEEDING SUPPLEMENT, NEPRO CARB STEADY,) LIQD Take 237 mLs by mouth daily. (0900)   Yes [provider]  ondansetron (ZOFRAN) 4 MG tablet Take 4 mg by mouth every 8 (eight) hours as needed for  nausea.   Yes [provider]  OXYGEN Inhale 2.5 L/min into the lungs at bedtime.   Yes [provider]  polyethylene glycol (MIRALAX / GLYCOLAX) 17 g packet Take 17 g by mouth 2 (two) times daily. Patient taking differently: Take 17 g by mouth daily as needed for moderate constipation. 06/17/22  Yes [place], Eric J, DO  pregabalin (LYRICA) 150 MG capsule Take 150 mg by mouth 2 (two) times daily.   Yes [provider]  sevelamer carbonate (RENVELA) 800 MG tablet Take 1,600 mg by mouth 3 (three) times daily with meals. (0800, 1200 & 1700)   Yes [provider]  vortioxetine HBr (TRINTELLIX) 20 MG TABS tablet Take 1 tablet (20 mg total) by mouth in the morning. (1000) 08/11/22  Yes Donita Brooks, MD  blood glucose meter kit and supplies Use up to four times daily as directed. 01/23/22   Ghimire, Werner Lean, MD  Continuous Blood Gluc Sensor (FREESTYLE LIBRE 2 SENSOR) MISC  01/14/22   [provider]  Fingerstix Lancets MISC Use as directed to check blood sugars as need up to 4 times daily 01/23/22   Ghimire, Werner Lean, MD  Insulin Pen Needle 32G X 4 MM MISC Use as directed 01/23/22   Ghimire, Werner Lean, MD  metoCLOPramide (REGLAN) 10 MG tablet Take 1 tablet (10 mg total) by mouth every 6 (six) hours as needed for nausea or vomiting. Patient not taking: Reported on 11/10/2022 09/08/22   Sherian Maroon A, PA  nystatin (MYCOSTATIN) 100000 UNIT/ML suspension Use as directed 5 mLs (500,000 Units total) in the mouth or throat 4 (four) times daily. Patient not taking: Reported on 11/10/2022 10/10/22   Donita Brooks, MD  oxyCODONE-acetaminophen (PERCOCET/ROXICET) 5-325 MG tablet Take 1 tablet by mouth every 6 (six) hours as needed. Patient not taking: Reported on 11/10/2022 08/20/22   Graceann Congress, PA-C  pantoprazole (PROTONIX) 40 MG tablet Take 1 tablet (40 mg total) by mouth 2 (two) times daily before a meal. Patient not taking: Reported on 11/10/2022 09/05/22  09/05/23  Leroy Sea, MD  pregabalin (LYRICA) 75 MG capsule Take 1 capsule (75 mg total) by mouth daily. Patient not taking: Reported on 11/10/2022 09/05/22   Leroy Sea, MD  senna-docusate (SENOKOT-S) 8.6-50 MG tablet Take 2 tablets by mouth 2 (two) times daily. Patient not taking: Reported on  11/10/2022 06/17/22   [place], Eric J, DO   Current Facility-Administered Medications  Medication Dose Route Frequency Provider Last Rate Last Admin   acetaminophen (TYLENOL) tablet 650 mg  650 mg Oral Q6H PRN Jonah Blue, MD       Or   acetaminophen (TYLENOL) suppository 650 mg  650 mg Rectal Q6H PRN Jonah Blue, MD       amiodarone (PACERONE) tablet 200 mg  200 mg Oral Daily Jonah Blue, MD       apixaban Everlene Balls) tablet 5 mg  5 mg Oral BID Jonah Blue, MD   5 mg at 11/11/22 1012   atorvastatin (LIPITOR) tablet 80 mg  80 mg Oral Daily Jonah Blue, MD   80 mg at 11/11/22 1012   bisacodyl (DULCOLAX) EC tablet 5 mg  5 mg Oral Daily PRN Jonah Blue, MD       calcium carbonate (dosed in mg elemental calcium) suspension 500 mg of elemental calcium  500 mg of elemental calcium Oral Q6H PRN Jonah Blue, MD       camphor-menthol Wynelle Fanny) lotion 1 Application  1 Application Topical Q8H PRN Jonah Blue, MD       And   hydrOXYzine (ATARAX) tablet 25 mg  25 mg Oral Q8H PRN Jonah Blue, MD       Chlorhexidine Gluconate Cloth 2 % PADS 6 each  6 each Topical Q0600 Bufford Buttner, MD       Chlorhexidine Gluconate Cloth 2 % PADS 6 each  6 each Topical Daily Jonah Blue, MD       docusate sodium (COLACE) capsule 100 mg  100 mg Oral BID Jonah Blue, MD   100 mg at 11/11/22 1011   docusate sodium (ENEMEEZ) enema 283 mg  1 enema Rectal PRN Jonah Blue, MD       feeding supplement (NEPRO CARB STEADY) liquid 237 mL  237 mL Oral Daily Jonah Blue, MD   237 mL at 11/11/22 1033   folic acid (FOLVITE) tablet 1 mg  1 mg Oral Daily Jonah Blue, MD   1 mg at  11/11/22 1011   hydrALAZINE (APRESOLINE) injection 5 mg  5 mg Intravenous Q4H PRN Jonah Blue, MD       insulin aspart (novoLOG) injection 0-5 Units  0-5 Units Subcutaneous QHS Jonah Blue, MD       insulin aspart (novoLOG) injection 0-6 Units  0-6 Units Subcutaneous TID WC Jonah Blue, MD   3 Units at 11/11/22 0900   insulin aspart (novoLOG) injection 3 Units  3 Units Subcutaneous TID WC Jonah Blue, MD       insulin glargine-yfgn [facility]) injection 30 Units  30 Units Subcutaneous BID Jonah Blue, MD       levothyroxine (SYNTHROID) tablet 75 mcg  75 mcg Oral QAC breakfast Jonah Blue, MD   75 mcg at 11/11/22 0615   LORazepam (ATIVAN) tablet 0.5 mg  0.5 mg Oral Noemi Chapel, MD   0.5 mg at 11/10/22 2138   midodrine (PROAMATINE) tablet 10 mg  10 mg Oral Q T,Th,Sa-HD Jonah Blue, MD       oxyCODONE (Oxy IR/ROXICODONE) immediate release tablet 5 mg  5 mg Oral Q4H PRN Jonah Blue, MD   5 mg at 11/10/22 2231   pantoprazole (PROTONIX) EC tablet 40 mg  40 mg Oral BID Magda Paganini, MD   40 mg at 11/11/22 1011   piperacillin-tazobactam (ZOSYN) IVPB 2.25 g  2.25 g Intravenous Bobette Mo, MD 100 mL/hr at 11/11/22 [phone]  2.25 g at 11/11/22 0617   polyethylene glycol (MIRALAX / GLYCOLAX) packet 17 g  17 g Oral Daily PRN Jonah Blue, MD       pregabalin (LYRICA) capsule 150 mg  150 mg Oral BID Jonah Blue, MD       senna-docusate (Senokot-S) tablet 2 tablet  2 tablet Oral BID Jonah Blue, MD   2 tablet at 11/11/22 1011   sevelamer carbonate (RENVELA) tablet 1,600 mg  1,600 mg Oral TID WC Jonah Blue, MD   1,600 mg at 11/11/22 1011   sodium hypochlorite (DAKIN'S 1/4 STRENGTH) topical solution   Irrigation BID Jonah Blue, MD   Given at 11/10/22 2234   sorbitol 70 % solution 30 mL  30 mL Oral PRN Jonah Blue, MD       vortioxetine HBr (TRINTELLIX) tablet 20 mg  20 mg Oral q AM Jonah Blue, MD   20 mg at 11/11/22 1012   zolpidem  (AMBIEN) tablet 5 mg  5 mg Oral QHS PRN Jonah Blue, MD         ROS: As per HPI otherwise negative.  Physical Exam: Vitals:   11/10/22 2103 11/11/22 0002 11/11/22 0410 11/11/22 0809  BP: (!) 152/63 (!) 142/62 (!) 116/54 (!) 113/56  Pulse: 80 78 67 62  Resp: 17 17 17 16   Temp: 98.2 F (36.8 C) 99 F (37.2 C) 98.2 F (36.8 C) 98 F (36.7 C)  TempSrc:   Oral Oral  SpO2: 96% 90% 97% 98%     General: Appears comfortable, in no distress  Head: NCAT sclera not icteric MMM Neck: Supple. No masses  Lungs: Clear bilaterally without wheezes, rales, or rhonchi. Heart: RRR, no murmur, rub, or gallop  Abdomen: soft non-tender no masses  Lower extremities: R BKA, L LE wrapped in bandage, 1+ edema  Neuro: A & O X 3. Moves all extremities spontaneously. Psych:  Responds to questions appropriately with a normal affect. Dialysis Access: R TDC; L AVF no bruit   Labs: Basic Metabolic Panel: Recent Labs  Lab 11/10/22 1345 11/10/22 1649 11/10/22 2200 11/11/22 0135  NA 129* 129*  --  130*  K 3.8 3.1*  --  3.2*  CL 93* 92*  --  93*  CO2 20*  --   --  21*  GLUCOSE 334* 477* 533* 486*  BUN 56* 51*  --  65*  CREATININE 4.03* 4.20*  --  4.12*  CALCIUM 8.9  --   --  8.4*   Liver Function Tests: Recent Labs  Lab 11/10/22 1345  AST 30  ALT 19  ALKPHOS 201*  BILITOT 0.9  PROT 6.5  ALBUMIN 3.0*   No results for input(s): "LIPASE", "AMYLASE" in the last 168 hours. No results for input(s): "AMMONIA" in the last 168 hours. CBC: Recent Labs  Lab 11/10/22 1345 11/10/22 1649 11/11/22 0135  WBC 7.0  --  6.7  NEUTROABS 5.5  --   --   HGB 11.0* 12.6 8.6*  HCT 36.5 37.0 28.2*  MCV 80.2  --  77.5*  PLT 125*  --  138*   Cardiac Enzymes: No results for input(s): "CKTOTAL", "CKMB", "CKMBINDEX", "TROPONINI" in the last 168 hours. CBG: Recent Labs  Lab 11/07/22 1211 11/10/22 2104 11/11/22 0304 11/11/22 0757 11/11/22 1141  GLUCAP 139* 525* 423* 281* 212*   Iron Studies: No  results for input(s): "IRON", "TIBC", "TRANSFERRIN", "FERRITIN" in the last 72 hours. Studies/Results: No results found.  Dialysis Orders:  [facility] TTS 4:00 400/800 EDW 91.5kg 2K/2Ca TDC  Heparin 4000 -Mircera 75  - last 6/8 -Calcitriol 0.5 three times per week   Assessment/Plan: Infected left lower extremity wound - maggot infestation on admission. Blood cultures neg to date. IV  Vanco/Zosyn per primary. Ortho following - for surgical debridement this week.  ESRD -  HD TTS. Continue on schedule - HD today. 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 For access surgery next month  Hypertension/volume  - BP controlled. Does have some volume on exam. UF as able.  Anemia  - Hgb 8.6. Next ESA due 6/22  Metabolic bone disease -  Ca ok. Continue home binders with diet.  Nutrition - Renal diet with fluid restriction.  T2DM - poorly controlled. Insulin per primary   Tomasa Blase PA-C [facility] 11/11/2022, 11:57 AM

## 2022-11-11 NOTE — Progress Notes (Signed)
   11/10/22 2108  Provider Notification  Provider Name/Title Garner Nash, NP  Date Provider Notified 11/10/22  Time Provider Notified 2109  Method of Notification Page  Notification Reason Critical Result  Test performed and critical result bg=[id number]  Date Critical Result Received 11/10/22  Provider response See new orders  Date of Provider Response 11/10/22  Time of Provider Response 2119   Pt was given 7 units of novolog and scheduled scheduled

## 2022-11-11 NOTE — Progress Notes (Addendum)
Received patient in bed to unit. Yes Alert and oriented. CAOx3 Informed consent signed and in chart. yes  TX duration:3.5  Patient tolerated well. yes Transported back to the room  Alert, without acute distress. No distress Hand-off given to patient's nurse. Vivi RN  Access used: Right internal jugular catheter Access issues: none  Total UF removed: Medication(s) given: heparin in catheter Post HD VS: 137/52 mp 85hr 71 Post HD weight: 91.5   [name]

## 2022-11-11 NOTE — Consult Note (Signed)
[name] Consult Evaluation  Service Date: November 11, 2022 LOS:  LOS: 1 day    Primary Psychiatric Diagnoses  MDD, recurrent, severe 2.  GAD w/ panic  Assessment  Susan [name] is a 72 y.o. female admitted medically for 11/10/2022 12:29 PM for a maggot infested wound. She carries the psychiatric diagnoses of MDD and anxiety and has a past medical history fully listed below; most significant for uncontrolled diabetes, venous stasis ulcer, and BKA. Psychiatry was consulted for positive [place] by Dr. Ophelia Charter.   Her current presentation of poor sleep, low mood, feelings of worthlessness, anhedonia, low energy is most consistent with known diagnosis of MDD. She meets criteria for GAD with anxious distress based on reports of ruminative thoughts and panic attacks (does not meet avoidant criteria for panic d/o. Her recent decompensation is clearly tied to loss of independence and medical illness (causually and temporally) and, in addition to pharmacotherapy outlined below, she would benefit from aggressive physical therapy. She consented to medication changes as below after discussion of r/b/se. I had wanted to start duloxetine but this is not generally used in people with ESRD. She was engaged with care and is treatment-focused (came here appropriately for venous stasis ulcer). She is currently appropriate for outpt management of MDD.   She did not provide permission to call her husband.   Diagnoses:  Active Hospital problems: Principal Problem:   Complicated wound infection Active Problems:   Hyperlipemia   Hypotension   Impaired ambulation   Hypothyroidism   Prolonged QT interval   Type 2 diabetes mellitus with diabetic polyneuropathy, with long-term current use of insulin (HCC)   Obesity, Class III, BMI 40-49.9 (morbid obesity) (HCC)   Paroxysmal atrial fibrillation (HCC)   S/P BKA (below knee amputation) unilateral, right (HCC)   ESRD on dialysis (HCC)     Plan   ##  Psychiatric Medication Recommendations:  Depression, anxiety -- CROSSTAPER vortioxetine --> venlafaxine as below. May adjust depending on response  -- 3 days of vortioxetine 20 + venlafaxine 37.5  -- 3 days of vortioxetine 10 + venlafaxine 75 -- then venlafaxine 75 mg   Future thoughts: inc venlafaxine, would go to about 150 before deciding ineffective in this ESRD pt  ## Medical Decision Making Capacity:  Not formally assessed   ## Further Work-up:  -- none currently     -- most recent EKG on 6/18 had QtC of 532 in setting of RBBB; JTc corrects to <400  -- Pertinent labwork reviewed earlier this admission includes: generally elevated blood sugar, TSH slightly elevated in 09/2022  ## Disposition:  -- There are no current psychiatric contraindications to discharge -- needs referrals to psych, therapy upon dc.   ## Behavioral / Environmental:  --  Utilize compassion and acknowledge the patient's experiences while setting clear and realistic expectations for care.    ## Safety and Observation Level:  - Based on my clinical evaluation, I estimate the patient to be at low risk of self harm in the current setting - At this time, we recommend a routine level of observation. This decision is based on my review of the chart including patient's history and current presentation, interview of the patient, mental status examination, and consideration of suicide risk including evaluating suicidal ideation, plan, intent, suicidal or self-harm behaviors, risk factors, and protective factors. This judgment is based on our ability to directly address suicide risk, implement suicide prevention strategies and develop a safety plan while the patient is in the  clinical setting. Please contact our team if there is a concern that risk level has changed.  Susan [name] A Stefanie Hodgens  Psychiatric and Social History   Relevant Aspects of Hospital Course:  Admitted on 11/10/2022 for venous stasis ulcer infested  with maggots  Patient Report:  Patient seen in afternoon.  She was quite pleasant throughout interview, and appeared to appreciate having the chance to talk about her issues.  She stated that her mood is "I am just so sad".  She has struggled with depression for most of her adult life.  She used to have periods of remission, but since about October of last year (when she went on dialysis) she feels she has been in a continuous depressive episode.  She experienced some initial relief from SSRIs (see below) which did not last.  She is separately quite concerned about her sleep-got first good night of sleep last night (likely from oxycodone). Usually sleeps about 11-1 and 5-7.   She has had thoughts that she would be better off dead.  She has had some brief thoughts about suicide, but has not really developed a plan or intent.  Although she is not involved in organized religion, she makes several comments such as "God never wants you to give up".  She has seen the effect that a suicide attempt has had on her son's life, and this scares her.  He stays with her for 3 days a week which used to help with her sense of purpose; per her report it sounds like he has pseudobulbar affect and lately him laughing when she is in distress has been hard.  she is future oriented throughout interview, and was really looking forward to her prosthesis fitting (was scheduled for yesterday) before she needed to come to the hospital.  Psych ROS:  Depression: + difficulty sleeping, anhedonia (cut off from most activities d/t mobility), worthlessness, decreased energy. Appetite was good at home but doesn't like hospital food. + passive SI Anxiety:  episodes of panic (can't breathe, HR, sweats). Can go months without in good months - more recently a couple of times a week. Also ruminative thoughts. No real social anxiety.  Mania (lifetime and current): denies Psychosis: (lifetime and current): denies (joked about phantom limb pain  here)  Collateral information:  Was not given permission to contact  Psychiatric History:  Information collected from pt, medical record  Prev Dx/Sx: Depression, anxiety Current Psych Provider: PCP Home Meds (current): trintillex 20 mg, lorazepam 0.5 mg QHS Previous Med Trials: lexapro, zoloft, prozac (briefly effective), wellbutrin (unclear efficacy).  Therapy: no   Prior Psych Hospitalization: no  Prior Self Harm: no Prior Violence: no  Family Psych History: Son attempted suicide via GSW to head, now disabled.    Social History:   Educational Hx: high school Occupational Hx: office Legal Hx: no Living Situation: with husband of 24 years, son 3 days/week Spiritual Hx: yes, but not religious Access to weapons: yes - has guns in bedroom - pt states she cannot reach  Substance History Tobacco use: quit 20+ y ears ago Alcohol use: no Drug use: no   Exam Findings   Psychiatric Specialty Exam:  Presentation  General Appearance: No data recorded Eye Contact:No data recorded Speech:No data recorded Speech Volume:No data recorded Handedness:No data recorded  Mood and Affect  Mood:No data recorded Affect:No data recorded  Thought Process  Thought Processes:No data recorded Descriptions of Associations:No data recorded Orientation:No data recorded Thought Content:No data recorded Hallucinations:No data recorded Ideas of Reference:No  data recorded Suicidal Thoughts:No data recorded Homicidal Thoughts:No data recorded  Sensorium  Memory:No data recorded Judgment:No data recorded Insight:No data recorded  Executive Functions  Concentration:No data recorded Attention Span:No data recorded Recall:No data recorded Fund of Knowledge:No data recorded Language:No data recorded  Psychomotor Activity  Psychomotor Activity:No data recorded  Assets  Assets:No data recorded  Sleep  Sleep:No data recorded   Physical Exam: Vital signs:  Temp:  [97.5 F  (36.4 C)-99 F (37.2 C)] 98 F (36.7 C) (06/18 1449) Pulse Rate:  [62-80] 73 (06/18 1530) Resp:  [11-17] 15 (06/18 1530) BP: (113-152)/(54-73) 132/59 (06/18 1530) SpO2:  [90 %-100 %] 100 % (06/18 1530) Weight:  [94.4 kg] 94.4 kg (06/18 1449) Physical Exam  Blood pressure (!) 132/59, pulse 73, temperature 98 F (36.7 C), temperature source Oral, resp. rate 15, weight 94.4 kg, SpO2 100 %. Body mass index is 34.63 kg/m.   Other History   These have been pulled in through the EMR, reviewed, and updated if appropriate.   Family History:  The patient's family history includes Heart attack (age of onset: 59) in her mother.  Medical History: Past Medical History:  Diagnosis Date   Anemia    hx   Anxiety    Arthritis    "generalized" (03/15/2014)   CAD (coronary artery disease)    MI in 2000 - MI  2007 - treated bare metal stent (no nuclear since then as 9/11)   Carotid artery disease (HCC)    Chronic diastolic heart failure (HCC)    a) ECHO (08/2013) EF 55-60% and RV function nl b) RHC (08/2013) RA 4, RV 30/5/7, PA 25/10 (16), PCWP 7, Fick CO/CI 6.3/2.7, PVR 1.5 WU, PA 61 and 66%   Daily headache    "~ every other day; since I fell in June" (03/15/2014)   Depression    Dyslipidemia    ESRD (end stage renal disease) (HCC)    Dialysis on Tues Thurs Sat   Exertional shortness of breath    History of kidney stones    HTN (hypertension)    Hypothyroidism    Obesity    Osteoarthritis    PAF (paroxysmal atrial fibrillation) (HCC)    Peripheral neuropathy    bilateral feet/hands   PONV (postoperative nausea and vomiting)    RBBB (right bundle branch block)    Old   Stroke (HCC)    mini strokes   Type II diabetes mellitus (HCC)    Type II, [name]. patient has omnipod insulin pump with Novolin R Insulin    Surgical History: Past Surgical History:  Procedure Laterality Date   A/V FISTULAGRAM Left 11/07/2022   Procedure: A/V Fistulagram;  Surgeon:  Leonie Douglas, MD;  [facility]: [facility];  Service: Cardiovascular;  Laterality: Left;   ABDOMINAL HYSTERECTOMY  1980's   AMPUTATION Right 02/24/2018   Procedure: RIGHT FOOT GREAT TOE AND 2ND TOE AMPUTATION;  Surgeon: Nadara Mustard, MD;  Location: [facility] OR;  Service: Orthopedics;  Laterality: Right;   AMPUTATION Right 04/30/2018   Procedure: RIGHT TRANSMETATARSAL AMPUTATION;  Surgeon: Nadara Mustard, MD;  Location: [facility] OR;  Service: Orthopedics;  Laterality: Right;   AMPUTATION Right 05/02/2022   Procedure: RIGHT BELOW KNEE AMPUTATION;  Surgeon: Nadara Mustard, MD;  Location: [facility] OR;  Service: Orthopedics;  Laterality: Right;   APPLICATION OF WOUND VAC Right 06/13/2022   Procedure: APPLICATION OF WOUND VAC;  Surgeon: Nadara Mustard, MD;  Location: [facility] OR;  Service:  Orthopedics;  Laterality: Right;   AV FISTULA PLACEMENT Left 04/02/2022   Procedure: LEFT ARM ARTERIOVENOUS (AV) FISTULA CREATION;  Surgeon: Nada Libman, MD;  [facility]: [facility] OR;  Service: Vascular;  Laterality: Left;  PERIPHERAL NERVE BLOCK   BASCILIC VEIN TRANSPOSITION Left 07/31/2022   Procedure: LEFT ARM SECOND STAGE BASILIC VEIN TRANSPOSITION;  Surgeon: Nada Libman, MD;  [facility]: [facility] OR;  Service: Vascular;  Laterality: Left;   BIOPSY  05/27/2020   Procedure: BIOPSY;  Surgeon: Lanelle Bal, DO;  Location: [facility] ENDO SUITE;  Service: Endoscopy;;   CATARACT EXTRACTION, BILATERAL Bilateral ?2013   COLONOSCOPY W/ POLYPECTOMY     COLONOSCOPY WITH PROPOFOL N/A 03/13/2019   Procedure: COLONOSCOPY WITH PROPOFOL;  Surgeon: Beverley Fiedler, MD;  Location: [facility] ENDOSCOPY;  Service: Gastroenterology;  Laterality: N/A;   CORONARY ANGIOPLASTY WITH STENT PLACEMENT  1999; 2007   "1 + 1"   ERCP N/A 02/03/2022   Procedure: ENDOSCOPIC RETROGRADE CHOLANGIOPANCREATOGRAPHY (ERCP);  Surgeon: Jeani Hawking, MD;  Location: [facility] ENDOSCOPY;  Service: Gastroenterology;  Laterality: N/A;   ESOPHAGOGASTRODUODENOSCOPY (EGD) WITH PROPOFOL N/A 03/13/2019    Procedure: ESOPHAGOGASTRODUODENOSCOPY (EGD) WITH PROPOFOL;  Surgeon: Beverley Fiedler, MD;  Location: [facility] ENDOSCOPY;  Service: Gastroenterology;  Laterality: N/A;   ESOPHAGOGASTRODUODENOSCOPY (EGD) WITH PROPOFOL N/A 05/27/2020   Procedure: ESOPHAGOGASTRODUODENOSCOPY (EGD) WITH PROPOFOL;  Surgeon: Lanelle Bal, DO;  [facility];  Service: Endoscopy;  Laterality: N/A;   ESOPHAGOGASTRODUODENOSCOPY (EGD) WITH PROPOFOL N/A 09/03/2022   Procedure: ESOPHAGOGASTRODUODENOSCOPY (EGD) WITH PROPOFOL;  Surgeon: Jeani Hawking, MD;  Location: [facility] ENDOSCOPY;  Service: Gastroenterology;  Laterality: N/A;   EYE SURGERY Bilateral    lazer   FLEXIBLE SIGMOIDOSCOPY N/A 05/23/2022   Procedure: FLEXIBLE SIGMOIDOSCOPY;  Surgeon: Jeani Hawking, MD;  Location: [facility] ENDOSCOPY;  Service: Gastroenterology;  Laterality: N/A;   FLEXIBLE SIGMOIDOSCOPY N/A 05/24/2022   Procedure: FLEXIBLE SIGMOIDOSCOPY;  Surgeon: Imogene Burn, MD;  Location: [facility] ENDOSCOPY;  Service: Gastroenterology;  Laterality: N/A;   FLEXIBLE SIGMOIDOSCOPY N/A 09/03/2022   Procedure: FLEXIBLE SIGMOIDOSCOPY;  Surgeon: Jeani Hawking, MD;  Location: [facility] ENDOSCOPY;  Service: Gastroenterology;  Laterality: N/A;   HEMOSTASIS CLIP PLACEMENT  03/13/2019   Procedure: HEMOSTASIS CLIP PLACEMENT;  Surgeon: Beverley Fiedler, MD;  Location: [facility] ENDOSCOPY;  Service: Gastroenterology;;   HEMOSTASIS CLIP PLACEMENT  05/23/2022   Procedure: HEMOSTASIS CLIP PLACEMENT;  Surgeon: Jeani Hawking, MD;  Location: [facility] ENDOSCOPY;  Service: Gastroenterology;;   HEMOSTASIS CONTROL  05/24/2022   Procedure: HEMOSTASIS CONTROL;  Surgeon: Imogene Burn, MD;  Location: [facility] ENDOSCOPY;  Service: Gastroenterology;;   HOT HEMOSTASIS N/A 05/23/2022   Procedure: HOT HEMOSTASIS (ARGON PLASMA COAGULATION/BICAP);  Surgeon: Jeani Hawking, MD;  Location: [facility] ENDOSCOPY;  Service: Gastroenterology;  Laterality: N/A;   I & D EXTREMITY Left 05/05/2022   Procedure: IRRIGATION AND DEBRIDEMENT LEFT ARM AV  FISTULA;  Surgeon: Cephus Shelling, MD;  Location: [facility] OR;  Service: Vascular;  Laterality: Left;   INSERTION OF DIALYSIS CATHETER Right 04/02/2022   Procedure: INSERTION OF TUNNELED DIALYSIS CATHETER;  Surgeon: Nada Libman, MD;  Location: [facility] OR;  Service: Vascular;  Laterality: Right;   KNEE ARTHROSCOPY Left 10/25/2006   POLYPECTOMY  03/13/2019   Procedure: POLYPECTOMY;  Surgeon: Beverley Fiedler, MD;  Location: [facility] ENDOSCOPY;  Service: Gastroenterology;;   REMOVAL OF STONES  02/03/2022   Procedure: REMOVAL OF STONES;  Surgeon: Jeani Hawking, MD;  Location: [facility] ENDOSCOPY;  Service: Gastroenterology;;   REVISON OF ARTERIOVENOUS FISTULA Left 08/20/2022   Procedure: REVISON OF  LEFT ARM ARTERIOVENOUS FISTULA;  Surgeon: Nada Libman, MD;  Location: [facility] OR;  Service: Vascular;  Laterality: Left;   RIGHT HEART CATH N/A 07/24/2017   Procedure: RIGHT HEART CATH;  Surgeon: Dolores Patty, MD;  [facility];  Service: Cardiovascular;  Laterality: N/A;   RIGHT HEART CATHETERIZATION N/A 09/22/2013   Procedure: RIGHT HEART CATH;  Surgeon: Dolores Patty, MD;  Location: [facility] CATH LAB;  Service: Cardiovascular;  Laterality: N/A;   SHOULDER ARTHROSCOPY WITH OPEN ROTATOR CUFF REPAIR Right 03/14/2014   Procedure: RIGHT SHOULDER ARTHROSCOPY WITH BICEPS RELEASE, OPEN SUBSCAPULA REPAIR, OPEN SUPRASPINATUS REPAIR.;  Surgeon: Cammy Copa, MD;  Location: [facility] OR;  Service: Orthopedics;  Laterality: Right;   SPHINCTEROTOMY  02/03/2022   Procedure: SPHINCTEROTOMY;  Surgeon: Jeani Hawking, MD;  Location: [facility] ENDOSCOPY;  Service: Gastroenterology;;   STUMP REVISION Right 06/13/2022   Procedure: REVISION RIGHT BELOW KNEE AMPUTATION;  Surgeon: Nadara Mustard, MD;  Location: [facility] OR;  Service: Orthopedics;  Laterality: Right;   TEE WITHOUT CARDIOVERSION N/A 02/04/2022   Procedure: TRANSESOPHAGEAL ECHOCARDIOGRAM (TEE);  Surgeon: Dolores Patty, MD;  Location: [facility] ENDOSCOPY;  Service: Cardiovascular;   Laterality: N/A;   THROMBECTOMY W/ EMBOLECTOMY Left 08/20/2022   Procedure: THROMBECTOMY OF LEFT ARM ARTERIOVENOUS FISTULA;  Surgeon: Nada Libman, MD;  Location: [facility] OR;  Service: Vascular;  Laterality: Left;   TOE AMPUTATION Right 02/24/2018   GREAT TOE AND 2ND TOE AMPUTATION   TUBAL LIGATION  1970's    Medications:   Current Facility-Administered Medications:    (feeding supplement) PROSource Plus liquid 30 mL, 30 mL, Oral, BID BM, Jonah Blue, MD   acetaminophen (TYLENOL) tablet 650 mg, 650 mg, Oral, Q6H PRN **OR** acetaminophen (TYLENOL) suppository 650 mg, 650 mg, Rectal, Q6H PRN, Jonah Blue, MD   alteplase (CATHFLO ACTIVASE) injection 2 mg, 2 mg, Intracatheter, Once PRN, Bufford Buttner, MD   amiodarone (PACERONE) tablet 200 mg, 200 mg, Oral, Daily, Jonah Blue, MD   anticoagulant sodium citrate solution 5 mL, 5 mL, Intracatheter, PRN, Bufford Buttner, MD   apixaban Everlene Balls) tablet 5 mg, 5 mg, Oral, BID, Jonah Blue, MD, 5 mg at 11/11/22 1012   ascorbic acid (VITAMIN C) tablet 500 mg, 500 mg, Oral, Daily, Jonah Blue, MD   atorvastatin (LIPITOR) tablet 80 mg, 80 mg, Oral, Daily, Jonah Blue, MD, 80 mg at 11/11/22 1012   bisacodyl (DULCOLAX) EC tablet 5 mg, 5 mg, Oral, Daily PRN, Jonah Blue, MD   calcium carbonate (dosed in mg elemental calcium) suspension 500 mg of elemental calcium, 500 mg of elemental calcium, Oral, Q6H PRN, Jonah Blue, MD   camphor-menthol [facility]) lotion 1 Application, 1 Application, Topical, Q8H PRN **AND** hydrOXYzine (ATARAX) tablet 25 mg, 25 mg, Oral, Q8H PRN, Jonah Blue, MD   Chlorhexidine Gluconate Cloth 2 % PADS 6 each, 6 each, Topical, Q0600, Bufford Buttner, MD   Chlorhexidine Gluconate Cloth 2 % PADS 6 each, 6 each, Topical, Daily, Jonah Blue, MD, 6 each at 11/11/22 1307   docusate sodium (COLACE) capsule 100 mg, 100 mg, Oral, BID, Jonah Blue, MD, 100 mg at 11/11/22 1011   docusate sodium (ENEMEEZ)  enema 283 mg, 1 enema, Rectal, PRN, Jonah Blue, MD   feeding supplement (NEPRO CARB STEADY) liquid 237 mL, 237 mL, Oral, Daily, Jonah Blue, MD, 237 mL at 11/11/22 1033   folic acid (FOLVITE) tablet 1 mg, 1 mg, Oral, Daily, Jonah Blue, MD, 1 mg at 11/11/22 1011   heparin injection 1,000 Units, 1,000  Units, Intracatheter, PRN, Bufford Buttner, MD   hydrALAZINE (APRESOLINE) injection 5 mg, 5 mg, Intravenous, Q4H PRN, Jonah Blue, MD   insulin aspart (novoLOG) injection 0-5 Units, 0-5 Units, Subcutaneous, QHS, Jonah Blue, MD   insulin aspart (novoLOG) injection 0-6 Units, 0-6 Units, Subcutaneous, TID WC, Jonah Blue, MD, 2 Units at 11/11/22 1200   insulin aspart (novoLOG) injection 3 Units, 3 Units, Subcutaneous, TID WC, Jonah Blue, MD, 3 Units at 11/11/22 1200   insulin glargine-yfgn (SEMGLEE) injection 30 Units, 30 Units, Subcutaneous, BID, Jonah Blue, MD, 30 Units at 11/11/22 1341   levothyroxine (SYNTHROID) tablet 75 mcg, 75 mcg, Oral, QAC breakfast, Jonah Blue, MD, 75 mcg at 11/11/22 0615   LORazepam (ATIVAN) tablet 0.5 mg, 0.5 mg, Oral, QHS, Jonah Blue, MD, 0.5 mg at 11/10/22 2138   melatonin tablet 3 mg, 3 mg, Oral, QHS, Allard Lightsey A   midodrine (PROAMATINE) tablet 10 mg, 10 mg, Oral, Q T,Th,Sa-HD, Jonah Blue, MD   oxyCODONE (Oxy IR/ROXICODONE) immediate release tablet 5 mg, 5 mg, Oral, Q4H PRN, Jonah Blue, MD, 5 mg at 11/10/22 2231   pantoprazole (PROTONIX) EC tablet 40 mg, 40 mg, Oral, BID AC, Jonah Blue, MD, 40 mg at 11/11/22 1011   piperacillin-tazobactam (ZOSYN) IVPB 2.25 g, 2.25 g, Intravenous, Q8H, Jonah Blue, MD, Last Rate: 100 mL/hr at 11/11/22 0617, 2.25 g at 11/11/22 0617   polyethylene glycol (MIRALAX / GLYCOLAX) packet 17 g, 17 g, Oral, Daily PRN, Jonah Blue, MD   pregabalin (LYRICA) capsule 150 mg, 150 mg, Oral, BID, Jonah Blue, MD, 150 mg at 11/11/22 1302   senna-docusate (Senokot-S) tablet 2  tablet, 2 tablet, Oral, BID, Jonah Blue, MD, 2 tablet at 11/11/22 1011   sevelamer carbonate (RENVELA) tablet 1,600 mg, 1,600 mg, Oral, TID WC, Jonah Blue, MD, 1,600 mg at 11/11/22 1302   sodium hypochlorite (DAKIN'S 1/4 STRENGTH) topical solution, , Irrigation, BID, Jonah Blue, MD, Given at 11/11/22 1310   sorbitol 70 % solution 30 mL, 30 mL, Oral, PRN, Jonah Blue, MD   vancomycin (VANCOCIN) IVPB 1000 mg/200 mL premix, 1,000 mg, Intravenous, Q T,Th,Sa-HD, Rexford Maus, RPH   venlafaxine XR (EFFEXOR-XR) 24 hr capsule 37.5 mg, 37.5 mg, Oral, Q breakfast **FOLLOWED BY** [START ON 11/14/2022] venlafaxine XR (EFFEXOR-XR) 24 hr capsule 75 mg, 75 mg, Oral, Q breakfast, Kadyn Guild A   vortioxetine HBr (TRINTELLIX) tablet 20 mg, 20 mg, Oral, q AM, Tayden Duran A, 20 mg at 11/11/22 1012   zinc sulfate capsule 220 mg, 220 mg, Oral, Daily, Jonah Blue, MD   zolpidem (AMBIEN) tablet 5 mg, 5 mg, Oral, QHS PRN, Jonah Blue, MD  Allergies: Allergies  Allergen Reactions   Cephalexin Diarrhea and Other (See Comments)   Codeine Nausea And Vomiting and Other (See Comments)
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
## 2022-11-11 NOTE — Progress Notes (Signed)
Initial Nutrition Assessment  DOCUMENTATION CODES:   Obesity unspecified, Morbid obesity  INTERVENTION:  Liberalize diet to Carb Modified Nepro Shake po once daily, each supplement provides 425 kcal and 19 grams protein 30 ml ProSource Plus BID, each supplement provides 100 kcals and 15 grams protein.  Vitamin C 500mg  daily Zinc 220mg  BID x14 days  NUTRITION DIAGNOSIS:   Increased nutrient needs related to wound healing as evidenced by estimated needs.  GOAL:   Patient will meet greater than or equal to 90% of their needs  MONITOR:   PO intake, Supplement acceptance, Labs, Weight trends, Diet advancement, Skin, I & O's  REASON FOR ASSESSMENT:   Consult Wound healing  ASSESSMENT:   Pt admitted with ongoing L leg wound. PMH significant for CAD, chronic diastolic CHF, HLD, ESRD on HD, HTN, PVD s/p R BKA, hypothyroidism, morbid obesity, afib and DM.   Per Ortho, plans for surgical debridement tomorrow Wednesday or Friday  RD working remotely. Spoke with pt via phone call to room. She recalls eating well at home but during admission has not been able to eat bit a few bites of pot roast. She states that she is in pain and has a "nervous tummy." She reports having diarrhea today, although only 1 type 3 BM documented, which is new but suspects it's 2/2 bowel medications.   Pt states that she was drinking ProSource supplements during prior rehab admission for the added protein and did not prefer them but wishes to continue to receive them to help with protein adequacy and healing. She intermittently would consume Nepro shakes at home but not consistently.    Was residing in a SNF following R BKA however has been home the last few weeks and is primarily wheelchair bound.   Within the last 2 months, pt's weight appears to be +10.5 kg.  EDW 91.5 kg Current weight is 113.9 kg  Medications: colace, folvite, SSI 0-5 units at bedtime, SSI 0-6 units TID, SSI 3 units TID, semglee 30 units  BID, protonix, senna, renvela, IV abx  Labs: sodium 130, potassium 3.2, BUN 65, Cr 4.12, anion gap 16, ionized Ca 1.10 (6/17), GFR 11, HgbA1c 7.9% (08/27/22), CBG's 212-525 x24 hours  NUTRITION - FOCUSED PHYSICAL EXAM: RD working remotely. Deferred to follow up.   Diet Order:   Diet Order             Diet NPO time specified  Diet effective midnight                   EDUCATION NEEDS:   No education needs have been identified at this time  Skin:  Skin Assessment: Skin Integrity Issues: Skin Integrity Issues:: Stage II, Diabetic Ulcer Stage II: Per WOC: partial thickness Stage 2 pressure injury to the sacrum, 2cm x 3cm and superficial, depth is 0.1cm Diabetic Ulcer: LLE  Last BM:  6/18 (type 3 medium)  Height:   Ht Readings from Last 1 Encounters:  11/07/22 5\' 5"  (1.651 m)    Weight:   Wt Readings from Last 1 Encounters:  11/07/22 113.9 kg    Ideal Body Weight:  53.1 kg (adj for R BKA)  BMI:  There is no height or weight on file to calculate BMI.  Estimated Nutritional Needs:   Kcal:  1500-1700  Protein:  80-95g  Fluid:  1L + UOP  Drusilla Kanner, RDN, LDN Clinical Nutrition

## 2022-11-11 NOTE — Inpatient Diabetes Management (Signed)
Inpatient Diabetes Program Recommendations  AACE/ADA: New Consensus Statement on Inpatient Glycemic Control (2015)  Target Ranges:  Prepandial:   less than 140 mg/dL      Peak postprandial:   less than 180 mg/dL (1-2 hours)      Critically ill patients:  140 - 180 mg/dL   Lab Results  Component Value Date   GLUCAP 281 (H) 11/11/2022   HGBA1C 7.9 (H) 08/27/2022    Review of Glycemic Control  Latest Reference Range & Units 11/10/22 21:04 11/11/22 03:04 11/11/22 07:57  Glucose-Capillary 70 - 99 mg/dL 409 (HH) 811 (H) 914 (H)  (HH): Data is critically high (H): Data is abnormally high Diabetes history: Type 2 DM Outpatient Diabetes medications: Novolog 0-15 units TID, Novolog 12 units TID, Lantus 45 units at bedtime, Tradjenta 5 mg QD Current orders for Inpatient glycemic control: Semglee 30 units BID, Novolog 0-6 units TID & HS  Inpatient Diabetes Program Recommendations:    Also consider adding Novolog 3 units TID (Assuming patient to consume >50% of meals).   Thanks, Lujean Rave, MSN, RNC-OB Diabetes Coordinator [phone] (8a-5p)

## 2022-11-11 NOTE — Progress Notes (Signed)
Progress Note   Patient: Susan [name] ZOX:[id number] DOB: 19-Apr-1951 DOA: 11/10/2022     1 DOS: the patient was seen and examined on 11/11/2022   Brief hospital course: 72yo female with h/o CAD, chronic diastolic CHF, HLD, TTS HD, HTN, PVD s/p R BKA, hypothyroidism, morbid obesity, afib, and DM presenting with maggot infestation of her LLE.  Dr. Lajoyce Corners sent her to the ER and plans for surgical debridement later this week.  She was started on Vanc/Zosyn.  Assessment and Plan:  LLE Wound Infection -Patient with prior RLE BKA now presenting with refractory L lateral lower leg ulcer with maggot infestation -Patient has failed outpatient management, seeing Dr. Lajoyce Corners weekly -Negative lactate, no current concerns for sepsis -Will treat with IV antibiotics (Zosyn/Vanc) -Orthopedics is consulting, plans for surgical debridement tomorrow -I have ordered ABIs in case revascularization may be indicated -LE wound order set utilized including labs (CRP, ESR, A1c, prealbumin, HIV, and blood cultures) and consults (diabetes coordinator; peripheral vascular navigator; TOC team; wound care; and nutrition)   Pressure ulcer, POA Pressure Injury 08/27/22 Coccyx Mid Stage 2 -  Partial thickness loss of dermis presenting as a shallow open injury with a red, pink wound bed without slough. 59mmx5mmx0 red center unattached edges (Active)  08/27/22 1000  Location: Coccyx  Location Orientation: Mid  Staging: Stage 2 -  Partial thickness loss of dermis presenting as a shallow open injury with a red, pink wound bed without slough.  Wound Description (Comments): 64mmx5mmx0 red center unattached edges  Present on Admission:     ESRD on HD -Patient on chronic TTS HD -Nephrology prn order set utilized -She does not appear to be volume overloaded or otherwise in need of acute HD -Nephrology is notified that patient will need HD  -Still having access issues so has TDC with planned outpatient f/u in 3-4 weeks   DM -A1c  7.9, suboptimal control -Continue glargine -Cover with very sensitive-scale SSI    Afib -Rate controlled with amiodarone -Continue Eilquis for now unless orthopedics requests otherwise   H/o Right renal infarct, splenic infarct  -In the setting of underlying A-fib.   -Anticoagulation with Eliquis   Hypothyroidism  -Continue Synthroid   Hypotension  -Continue midodrine   PVD -s/p R BKA -ABIs ordered to assess for RLE vascular compromise -Continue atorvastatin   Sacral pressure ulcer -Wound care consulted -Husband reports that she has had multiple ulcers and this has been difficult since she is fully non-ambulatory   Morbid obesity -BMI 41.77 -Weight loss should be encouraged -Outpatient PCP/bariatric medicine f/u encouraged    Ambulatory dysfunction -She has not been able to even bear weight to assist with transfers since BKA -She is being fitted for prosthesis -She is likely to benefit from inpatient rehab if at all possible - 3 hours of rehab daily is her best bet to get more mobile -Suggest CIR consult post-operatively   Prolonged QTc -Will attempt to avoid QT-prolonging medications such as PPI, nausea meds, SSRIs (may need regardless based no mood disturbance) -Repeat EKG in AM   Depressed mood -Patient failed her [place] Screen, acknowledges depressed mood -She contracts for safety at this time -Will consult psychiatry    Subjective: She reports feeling a little better today.  She does acknowledge having depressed mood but contracts for safety.  Physical Exam: Vitals:   11/10/22 2103 11/11/22 0002 11/11/22 0410 11/11/22 0809  BP: (!) 152/63 (!) 142/62 (!) 116/54 (!) 113/56  Pulse: 80 78 67 62  Resp: 17 17  17 16  Temp: 98.2 F (36.8 C) 99 F (37.2 C) 98.2 F (36.8 C) 98 F (36.7 C)  TempSrc:   Oral Oral  SpO2: 96% 90% 97% 98%   General:  Appears calm and comfortable and is in NAD Eyes:  EOMI, normal lids, iris ENT:  grossly normal hearing, lips &  tongue, mmm Neck:  no LAD, masses or thyromegaly Cardiovascular:  RRR, no m/r/g. No LE edema.  Respiratory:   CTA bilaterally with no wheezes/rales/rhonchi.  Normal respiratory effort. Abdomen:  soft, NT, ND Skin:  LLE is wrapped with mild erythema extending beyond the bandage Musculoskeletal:  grossly normal tone BUE/BLE, good ROM, no bony abnormality Psychiatric:  somnolent/blunted mood and affect, speech fluent and appropriate, AOx3 Neurologic:  CN 2-12 grossly intact, moves all extremities in coordinated fashion    EKG:  NSR with rate 64, prolonged QTc 532, RBBB, nonspecific ST changes  Pertinent labs:    Na++ 130 K+ 3.2 Glucose 486 BUN 65/Creatinine 4.12/GFR 11 Anion gap 16 WBC 6.7 Hgb 8.6   Family Communication: None present today; I spoke extensively with patient/husband yesterday afternoon at time of admission  Disposition: Status is: Inpatient Remains inpatient appropriate because: needs surgical debridement  Planned Discharge Destination:  to be determined    Time spent: 35 minutes  Author: Jonah Blue, MD 11/11/2022 8:25 AM  For on call review [email or web address].

## 2022-11-12 ENCOUNTER — Encounter (HOSPITAL_COMMUNITY)
Admission: EM | Disposition: A | Discharge status: Other Acute Inpatient Hospital | Payer: Self-pay | Source: Home / Self Care | Attending: Internal Medicine

## 2022-11-12 ENCOUNTER — Encounter (HOSPITAL_COMMUNITY): Payer: Self-pay | Admitting: Anesthesiology

## 2022-11-12 ENCOUNTER — Encounter (HOSPITAL_COMMUNITY): Payer: Self-pay | Admitting: Internal Medicine

## 2022-11-12 ENCOUNTER — Other Ambulatory Visit: Payer: Self-pay

## 2022-11-12 DIAGNOSIS — I952 Hypotension due to drugs: Secondary | ICD-10-CM

## 2022-11-12 DIAGNOSIS — Z89511 Acquired absence of right leg below knee: Secondary | ICD-10-CM | POA: Diagnosis not present

## 2022-11-12 DIAGNOSIS — E1142 Type 2 diabetes mellitus with diabetic polyneuropathy: Secondary | ICD-10-CM | POA: Diagnosis not present

## 2022-11-12 DIAGNOSIS — I480 Paroxysmal atrial fibrillation: Secondary | ICD-10-CM

## 2022-11-12 DIAGNOSIS — F329 Major depressive disorder, single episode, unspecified: Secondary | ICD-10-CM

## 2022-11-12 DIAGNOSIS — R9431 Abnormal electrocardiogram [ECG] [EKG]: Secondary | ICD-10-CM

## 2022-11-12 DIAGNOSIS — L089 Local infection of the skin and subcutaneous tissue, unspecified: Secondary | ICD-10-CM | POA: Diagnosis not present

## 2022-11-12 DIAGNOSIS — N186 End stage renal disease: Secondary | ICD-10-CM | POA: Diagnosis not present

## 2022-11-12 DIAGNOSIS — E039 Hypothyroidism, unspecified: Secondary | ICD-10-CM

## 2022-11-12 DIAGNOSIS — Z794 Long term (current) use of insulin: Secondary | ICD-10-CM

## 2022-11-12 DIAGNOSIS — Z992 Dependence on renal dialysis: Secondary | ICD-10-CM

## 2022-11-12 DIAGNOSIS — B879 Myiasis, unspecified: Secondary | ICD-10-CM

## 2022-11-12 DIAGNOSIS — E11628 Type 2 diabetes mellitus with other skin complications: Secondary | ICD-10-CM | POA: Diagnosis not present

## 2022-11-12 DIAGNOSIS — L97229 Non-pressure chronic ulcer of left calf with unspecified severity: Secondary | ICD-10-CM | POA: Diagnosis not present

## 2022-11-12 DIAGNOSIS — T148XXA Other injury of unspecified body region, initial encounter: Secondary | ICD-10-CM | POA: Diagnosis not present

## 2022-11-12 DIAGNOSIS — E1152 Type 2 diabetes mellitus with diabetic peripheral angiopathy with gangrene: Secondary | ICD-10-CM | POA: Diagnosis not present

## 2022-11-12 DIAGNOSIS — L89152 Pressure ulcer of sacral region, stage 2: Secondary | ICD-10-CM | POA: Diagnosis not present

## 2022-11-12 DIAGNOSIS — R262 Difficulty in walking, not elsewhere classified: Secondary | ICD-10-CM

## 2022-11-12 LAB — CBC WITH DIFFERENTIAL/PLATELET
Abs Immature Granulocytes: 0.01 10*3/uL (ref 0.00–0.07)
Basophils Absolute: 0 10*3/uL (ref 0.0–0.1)
Basophils Relative: 0 %
Eosinophils Absolute: 0.3 10*3/uL (ref 0.0–0.5)
Eosinophils Relative: 5 %
HCT: 29.8 % — ABNORMAL LOW (ref 36.0–46.0)
Hemoglobin: 9 g/dL — ABNORMAL LOW (ref 12.0–15.0)
Immature Granulocytes: 0 %
Lymphocytes Relative: 13 %
Lymphs Abs: 0.7 10*3/uL (ref 0.7–4.0)
MCH: 23.5 pg — ABNORMAL LOW (ref 26.0–34.0)
MCHC: 30.2 g/dL (ref 30.0–36.0)
MCV: 77.8 fL — ABNORMAL LOW (ref 80.0–100.0)
Monocytes Absolute: 0.6 10*3/uL (ref 0.1–1.0)
Monocytes Relative: 12 %
Neutro Abs: 3.7 10*3/uL (ref 1.7–7.7)
Neutrophils Relative %: 70 %
Platelets: 127 10*3/uL — ABNORMAL LOW (ref 150–400)
RBC: 3.83 MIL/uL — ABNORMAL LOW (ref 3.87–5.11)
RDW: 16.5 % — ABNORMAL HIGH (ref 11.5–15.5)
WBC: 5.3 10*3/uL (ref 4.0–10.5)
nRBC: 0 % (ref 0.0–0.2)

## 2022-11-12 LAB — GLUCOSE, CAPILLARY
Glucose-Capillary: 319 mg/dL — ABNORMAL HIGH (ref 70–99)
Glucose-Capillary: 290 mg/dL — ABNORMAL HIGH (ref 70–99)
Glucose-Capillary: 287 mg/dL — ABNORMAL HIGH (ref 70–99)
Glucose-Capillary: 105 mg/dL — ABNORMAL HIGH (ref 70–99)
Glucose-Capillary: 119 mg/dL — ABNORMAL HIGH (ref 70–99)

## 2022-11-12 LAB — RENAL FUNCTION PANEL
Albumin: 2.2 g/dL — ABNORMAL LOW (ref 3.5–5.0)
Anion gap: 12 (ref 5–15)
BUN: 51 mg/dL — ABNORMAL HIGH (ref 8–23)
CO2: 26 mmol/L (ref 22–32)
Calcium: 8.5 mg/dL — ABNORMAL LOW (ref 8.9–10.3)
Chloride: 96 mmol/L — ABNORMAL LOW (ref 98–111)
Creatinine, Ser: 3.79 mg/dL — ABNORMAL HIGH (ref 0.44–1.00)
GFR, Estimated: 12 mL/min — ABNORMAL LOW (ref >60)
Glucose, Bld: 335 mg/dL — ABNORMAL HIGH (ref 70–99)
Phosphorus: 5.8 mg/dL — ABNORMAL HIGH (ref 2.5–4.6)
Potassium: 3 mmol/L — ABNORMAL LOW (ref 3.5–5.1)
Sodium: 134 mmol/L — ABNORMAL LOW (ref 135–145)

## 2022-11-12 LAB — SURGICAL PCR SCREEN
MRSA, PCR: NEGATIVE
Staphylococcus aureus: POSITIVE — AB

## 2022-11-12 LAB — CULTURE, BLOOD (ROUTINE X 2)

## 2022-11-12 LAB — HEPATITIS B SURFACE ANTIBODY, QUANTITATIVE: Hep B S AB Quant (Post): 2649 m[IU]/mL (ref >9.9)

## 2022-11-12 SURGERY — IRRIGATION AND DEBRIDEMENT EXTREMITY
Anesthesia: Choice | Laterality: Left

## 2022-11-12 MED ORDER — CHLORHEXIDINE GLUCONATE 4 % EX SOLN: 60.0000 mL | Freq: Once | CUTANEOUS | Status: DC

## 2022-11-12 MED ORDER — ORAL CARE MOUTH RINSE: 15.0000 mL | Freq: Once | OROMUCOSAL | Status: AC

## 2022-11-12 MED ORDER — VANCOMYCIN HCL IN DEXTROSE 1-5 GM/200ML-% IV SOLN
1000.0000 mg | Freq: Once | INTRAVENOUS | Status: AC
  Administered 2022-11-12: 1000 mg via INTRAVENOUS
  Filled 2022-11-12: qty 200

## 2022-11-12 MED ORDER — MUPIROCIN 2 % EX OINT
1.0000 | TOPICAL_OINTMENT | Freq: Two times a day (BID) | CUTANEOUS | Status: AC
  Administered 2022-11-12 – 2022-11-16 (×8): 1 via NASAL
  Filled 2022-11-12 (×2): qty 22

## 2022-11-12 MED ORDER — SODIUM CHLORIDE 0.9 % IV SOLN: INTRAVENOUS | Status: DC

## 2022-11-12 MED ORDER — VANCOMYCIN HCL IN DEXTROSE 1-5 GM/200ML-% IV SOLN: 1000.0000 mg | INTRAVENOUS | Status: DC

## 2022-11-12 MED ORDER — POTASSIUM CHLORIDE CRYS ER 20 MEQ PO TBCR
20.0000 meq | EXTENDED_RELEASE_TABLET | Freq: Once | ORAL | Status: AC
  Administered 2022-11-12: 20 meq via ORAL
  Filled 2022-11-12: qty 1

## 2022-11-12 MED ORDER — CHLORHEXIDINE GLUCONATE 0.12 % MT SOLN
15.0000 mL | Freq: Once | OROMUCOSAL | Status: AC
  Administered 2022-11-12: 15 mL via OROMUCOSAL
  Filled 2022-11-12: qty 15

## 2022-11-12 MED ORDER — ENSURE PRE-SURGERY PO LIQD
296.0000 mL | Freq: Once | ORAL | Status: AC
  Administered 2022-11-12: 296 mL via ORAL
  Filled 2022-11-12: qty 296

## 2022-11-12 MED ORDER — VANCOMYCIN HCL IN DEXTROSE 1-5 GM/200ML-% IV SOLN
INTRAVENOUS | Status: AC
  Filled 2022-11-12: qty 200

## 2022-11-12 MED ORDER — INSULIN ASPART 100 UNIT/ML IJ SOLN
0.0000 [IU] | INTRAMUSCULAR | Status: DC | PRN
  Administered 2022-11-12: 4 [IU] via SUBCUTANEOUS

## 2022-11-12 MED ORDER — POVIDONE-IODINE 10 % EX SWAB
2.0000 | Freq: Once | CUTANEOUS | Status: AC
  Administered 2022-11-12: 2 via TOPICAL

## 2022-11-12 MED ORDER — INSULIN ASPART 100 UNIT/ML IJ SOLN
INTRAMUSCULAR | Status: AC
  Filled 2022-11-12: qty 1

## 2022-11-12 MED ORDER — LEVOFLOXACIN IN D5W 500 MG/100ML IV SOLN: 500.0000 mg | INTRAVENOUS | Status: DC

## 2022-11-12 NOTE — Progress Notes (Signed)
Surgery was canceled because patient received at 09:22 o'clock ProSource Plus (feeding supplement) 30 ml. OR was notified.

## 2022-11-12 NOTE — Progress Notes (Signed)
Inpatient Rehabilitation Admissions Coordinator   Rehab consult received. PT and OT not ordered . Noted has used all SNF days. Other rehab venues should be pursued at this time.  Ottie Glazier, RN, MSN Rehab Admissions Coordinator [phone] 11/12/2022 10:08 AM

## 2022-11-12 NOTE — Consult Note (Signed)
ORTHOPAEDIC CONSULTATION  REQUESTING PHYSICIAN: Rodolph Bong, MD  Chief Complaint: Necrotic wound with maggots lateral left leg.  HPI: Susan [name] is a 72 y.o. female who presents with necrotic venous ulcer lateral left leg with maggots.  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  Past Medical History:  Diagnosis Date   Anemia    hx   Anxiety    Arthritis    "generalized" (03/15/2014)   CAD (coronary artery disease)    MI in 2000 - MI  2007 - treated bare metal stent (no nuclear since then as 9/11)   Carotid artery disease (HCC)    Chronic diastolic heart failure (HCC)    a) ECHO (08/2013) EF 55-60% and RV function nl b) RHC (08/2013) RA 4, RV 30/5/7, PA 25/10 (16), PCWP 7, Fick CO/CI 6.3/2.7, PVR 1.5 WU, PA 61 and 66%   Daily headache    "~ every other day; since I fell in June" (03/15/2014)   Depression    Dyslipidemia    ESRD (end stage renal disease) (HCC)    Dialysis on Tues Thurs Sat   Exertional shortness of breath    History of kidney stones    HTN (hypertension)    Hypothyroidism    Obesity    Osteoarthritis    PAF (paroxysmal atrial fibrillation) (HCC)    Peripheral neuropathy    bilateral feet/hands   PONV (postoperative nausea and vomiting)    RBBB (right bundle branch block)    Old   Stroke (HCC)    mini strokes   Type II diabetes mellitus (HCC)    Type II, [name]. patient has omnipod insulin pump with Novolin R Insulin   Past Surgical History:  Procedure Laterality Date   A/V FISTULAGRAM Left 11/07/2022   Procedure: A/V Fistulagram;  Surgeon: Leonie Douglas, MD;  [facility];  Service: Cardiovascular;  Laterality: Left;   ABDOMINAL HYSTERECTOMY  1980's   AMPUTATION Right 02/24/2018   Procedure: RIGHT FOOT GREAT TOE AND 2ND TOE AMPUTATION;  Surgeon: Nadara Mustard, MD;  Location: [facility] OR;  Service: Orthopedics;  Laterality: Right;    AMPUTATION Right 04/30/2018   Procedure: RIGHT TRANSMETATARSAL AMPUTATION;  Surgeon: Nadara Mustard, MD;  Location[facility] OR;  Service: Orthopedics;  Laterality: Right;   AMPUTATION Right 05/02/2022   Procedure: RIGHT BELOW KNEE AMPUTATION;  Surgeon: Nadara Mustard, MD;  Location: [facility] OR;  Service: Orthopedics;  Laterality: Right;   APPLICATION OF WOUND VAC Right 06/13/2022   Procedure: APPLICATION OF WOUND VAC;  Surgeon: Nadara Mustard, MD;  Location: [facility] OR;  Service: Orthopedics;  Laterality: Right;   AV FISTULA PLACEMENT Left 04/02/2022   Procedure: LEFT ARM ARTERIOVENOUS (AV) FISTULA CREATION;  Surgeon: Nada Libman, MD;  Location: [facility] OR;  Service: Vascular;  Laterality: Left;  PERIPHERAL NERVE BLOCK   BASCILIC VEIN TRANSPOSITION Left 07/31/2022   Procedure: LEFT ARM SECOND STAGE BASILIC VEIN TRANSPOSITION;  Surgeon: Nada Libman, MD;  [facility]: [facility] OR;  Service: Vascular;  Laterality: Left;   BIOPSY  05/27/2020   Procedure: BIOPSY;  Surgeon: Lanelle Bal, DO;  Location: [facility] ENDO SUITE;  Service: Endoscopy;;   CATARACT EXTRACTION, BILATERAL Bilateral ?2013   COLONOSCOPY W/ POLYPECTOMY     COLONOSCOPY WITH PROPOFOL N/A 03/13/2019   Procedure: COLONOSCOPY WITH PROPOFOL;  Surgeon: Beverley Fiedler, MD;  Location: [facility] ENDOSCOPY;  Service: Gastroenterology;  Laterality: N/A;  CORONARY ANGIOPLASTY WITH STENT PLACEMENT  1999; 2007   "1 + 1"   ERCP N/A 02/03/2022   Procedure: ENDOSCOPIC RETROGRADE CHOLANGIOPANCREATOGRAPHY (ERCP);  Surgeon: Jeani Hawking, MD;  Location: [facility] ENDOSCOPY;  Service: Gastroenterology;  Laterality: N/A;   ESOPHAGOGASTRODUODENOSCOPY (EGD) WITH PROPOFOL N/A 03/13/2019   Procedure: ESOPHAGOGASTRODUODENOSCOPY (EGD) WITH PROPOFOL;  Surgeon: Beverley Fiedler, MD;  Location: [facility] ENDOSCOPY;  Service: Gastroenterology;  Laterality: N/A;   ESOPHAGOGASTRODUODENOSCOPY (EGD) WITH PROPOFOL N/A 05/27/2020   Procedure: ESOPHAGOGASTRODUODENOSCOPY (EGD) WITH PROPOFOL;  Surgeon: Lanelle Bal, DO;   [facility];  Service: Endoscopy;  Laterality: N/A;   ESOPHAGOGASTRODUODENOSCOPY (EGD) WITH PROPOFOL N/A 09/03/2022   Procedure: ESOPHAGOGASTRODUODENOSCOPY (EGD) WITH PROPOFOL;  Surgeon: Jeani Hawking, MD;  Location: [facility] ENDOSCOPY;  Service: Gastroenterology;  Laterality: N/A;   EYE SURGERY Bilateral    lazer   FLEXIBLE SIGMOIDOSCOPY N/A 05/23/2022   Procedure: FLEXIBLE SIGMOIDOSCOPY;  Surgeon: Jeani Hawking, MD;  Location: [facility] ENDOSCOPY;  Service: Gastroenterology;  Laterality: N/A;   FLEXIBLE SIGMOIDOSCOPY N/A 05/24/2022   Procedure: FLEXIBLE SIGMOIDOSCOPY;  Surgeon: Imogene Burn, MD;  Location: [facility] ENDOSCOPY;  Service: Gastroenterology;  Laterality: N/A;   FLEXIBLE SIGMOIDOSCOPY N/A 09/03/2022   Procedure: FLEXIBLE SIGMOIDOSCOPY;  Surgeon: Jeani Hawking, MD;  Location: [facility] ENDOSCOPY;  Service: Gastroenterology;  Laterality: N/A;   HEMOSTASIS CLIP PLACEMENT  03/13/2019   Procedure: HEMOSTASIS CLIP PLACEMENT;  Surgeon: Beverley Fiedler, MD;  Location: [facility] ENDOSCOPY;  Service: Gastroenterology;;   HEMOSTASIS CLIP PLACEMENT  05/23/2022   Procedure: HEMOSTASIS CLIP PLACEMENT;  Surgeon: Jeani Hawking, MD;  Location: [facility] ENDOSCOPY;  Service: Gastroenterology;;   HEMOSTASIS CONTROL  05/24/2022   Procedure: HEMOSTASIS CONTROL;  Surgeon: Imogene Burn, MD;  Location: [facility] ENDOSCOPY;  Service: Gastroenterology;;   HOT HEMOSTASIS N/A 05/23/2022   Procedure: HOT HEMOSTASIS (ARGON PLASMA COAGULATION/BICAP);  Surgeon: Jeani Hawking, MD;  Location: [facility] ENDOSCOPY;  Service: Gastroenterology;  Laterality: N/A;   I & D EXTREMITY Left 05/05/2022   Procedure: IRRIGATION AND DEBRIDEMENT LEFT ARM AV FISTULA;  Surgeon: Cephus Shelling, MD;  [facility] OR;  Service: Vascular;  Laterality: Left;   INSERTION OF DIALYSIS CATHETER Right 04/02/2022   Procedure: INSERTION OF TUNNELED DIALYSIS CATHETER;  Surgeon: Nada Libman, MD;  Location: [facility] OR;  Service: Vascular;  Laterality: Right;   KNEE ARTHROSCOPY Left  10/25/2006   POLYPECTOMY  03/13/2019   Procedure: POLYPECTOMY;  Surgeon: Beverley Fiedler, MD;  [facility] ENDOSCOPY;  Service: Gastroenterology;;   REMOVAL OF STONES  02/03/2022   Procedure: REMOVAL OF STONES;  Surgeon: Jeani Hawking, MD;  Location: [facility] ENDOSCOPY;  Service: Gastroenterology;;   REVISON OF ARTERIOVENOUS FISTULA Left 08/20/2022   Procedure: REVISON OF LEFT ARM ARTERIOVENOUS FISTULA;  Surgeon: Nada Libman, MD;  [facility] OR;  Service: Vascular;  Laterality: Left;   RIGHT HEART CATH N/A 07/24/2017   Procedure: RIGHT HEART CATH;  Surgeon: Dolores Patty, MD;  [facility];  Service: Cardiovascular;  Laterality: N/A;   RIGHT HEART CATHETERIZATION N/A 09/22/2013   Procedure: RIGHT HEART CATH;  Surgeon: Dolores Patty, MD;  Location: [facility] CATH LAB;  Service: Cardiovascular;  Laterality: N/A;   SHOULDER ARTHROSCOPY WITH OPEN ROTATOR CUFF REPAIR Right 03/14/2014   Procedure: RIGHT SHOULDER ARTHROSCOPY WITH BICEPS RELEASE, OPEN SUBSCAPULA REPAIR, OPEN SUPRASPINATUS REPAIR.;  Surgeon: Cammy Copa, MD;  Location: [facility] OR;  Service: Orthopedics;  Laterality: Right;   SPHINCTEROTOMY  02/03/2022   Procedure: SPHINCTEROTOMY;  Surgeon: Jeani Hawking, MD;  Location: [facility] ENDOSCOPY;  Service: Gastroenterology;;   STUMP REVISION Right 06/13/2022   Procedure: REVISION RIGHT BELOW KNEE AMPUTATION;  Surgeon: Nadara Mustard, MD;  Location: [facility] OR;  Service: Orthopedics;  Laterality: Right;   TEE WITHOUT CARDIOVERSION N/A 02/04/2022   Procedure: TRANSESOPHAGEAL ECHOCARDIOGRAM (TEE);  Surgeon: Dolores Patty, MD;  Location: [facility] ENDOSCOPY;  Service: Cardiovascular;  Laterality: N/A;   THROMBECTOMY W/ EMBOLECTOMY Left 08/20/2022   Procedure: THROMBECTOMY OF LEFT ARM ARTERIOVENOUS FISTULA;  Surgeon: Nada Libman, MD;  [facility];  Service: Vascular;  Laterality: Left;   TOE AMPUTATION Right 02/24/2018   GREAT TOE AND 2ND TOE AMPUTATION   TUBAL LIGATION  1970's   Social  History   Socioeconomic History   Marital status: Married    Spouse name: Ramon Dredge   Number of children: 3   Years of education: 12th   Highest education level: Not on file  Occupational History    Employer: UNEMPLOYED  Tobacco Use   Smoking status: Former    Packs/day: 3.00    Years: 32.00    Additional pack years: 0.00    Total pack years: 96.00    Types: Cigarettes    Quit date: 10/24/1997    Years since quitting: 25.0   Smokeless tobacco: Never  Vaping Use   Vaping Use: Never used  Substance and Sexual Activity   Alcohol use: Not Currently    Comment: "might have 2-3 daiquiris in the summer"   Drug use: No   Sexual activity: Not Currently    Birth control/protection: Surgical    Comment: Hysterectomy  Other Topics Concern   Not on file  Social History Narrative   Pt lives at home with her spouse.Caffeine Use- 3 sodas daily.  Update:  now resides [facility]   Social Determinants of Health   Financial Resource Strain: Low Risk  (08/16/2021)   Overall Financial Resource Strain (CARDIA)    Difficulty of Paying Living Expenses: Not hard at all  Food Insecurity: No Food Insecurity (11/10/2022)   Hunger Vital Sign    Worried About Running Out of Food in the Last Year: Never true    Ran Out of Food in the Last Year: Never true  Transportation Needs: No Transportation Needs (11/10/2022)   PRAPARE - Administrator, Civil Service (Medical): No    Lack of Transportation (Non-Medical): No  Physical Activity: Inactive (08/16/2021)   Exercise Vital Sign    Days of Exercise per Week: 0 days    Minutes of Exercise per Session: 0 min  Stress: No Stress Concern Present (08/16/2021)   [employer] of Occupational Health - Occupational Stress Questionnaire    Feeling of Stress : Not at all  Social Connections: Moderately Isolated (08/16/2021)   Social Connection and Isolation Panel [NHANES]    Frequency of Communication with Friends and Family: More than three times a  week    Frequency of Social Gatherings with Friends and Family: More than three times a week    Attends Religious Services: Never    Database administrator or Organizations: No    Attends Engineer, structural: Never    Marital Status: Married   Family History  Problem Relation Age of Onset   Heart attack Mother 47   - negative except otherwise stated in the family history section Allergies  Allergen Reactions   Cephalexin Diarrhea and Other (See Comments)   Codeine Nausea And Vomiting and Other (See Comments)   Prior to Admission medications  Medication Sig Start Date End Date Taking? Authorizing Provider  albuterol (VENTOLIN HFA) 108 (90 Base) MCG/ACT inhaler Inhale 1-2 puffs into the lungs every 6 (six) hours as needed for wheezing or shortness of breath.   Yes [provider]  amiodarone (PACERONE) 200 MG tablet Take 200 mg by mouth daily.   Yes [provider]  apixaban (ELIQUIS) 5 MG TABS tablet Take 1 tablet (5 mg total) by mouth 2 (two) times daily. 09/05/22  Yes Leroy Sea, MD  atorvastatin (LIPITOR) 80 MG tablet Take 1 tablet (80 mg total) by mouth daily. 09/05/22  Yes Leroy Sea, MD  diphenoxylate-atropine (LOMOTIL) 2.5-0.025 MG tablet Take 1 tablet by mouth 4 (four) times daily as needed for diarrhea or loose stools. 08/19/21  Yes Donita Brooks, MD  ferrous sulfate 325 (65 FE) MG tablet Take 325 mg by mouth 2 (two) times daily with a meal. (1000 & 2100)   Yes [provider]  hydrocortisone (ANUSOL-HC) 2.5 % rectal cream Place 1 Application rectally daily as needed for hemorrhoids. (1000 & 2100)   Yes [provider]  insulin aspart (NOVOLOG FLEXPEN) 100 UNIT/ML FlexPen Inject 0-15 Units into the skin See admin instructions. Inject 12 units subcutaneously 3 times daily with meals (0900, 1300 & 1700) & as needed via sliding scale insulin CBG <70=Notify NP/MD 71-149=0 units,150-199=4 units, 200-249=6 units,250- 299=10  units, 300-349=12 units, 350-399=15 units, > 400 call MD ( prime pen with 2 units prior  to set dose) Patient taking differently: Inject 0-16 Units into the skin See admin instructions. Inject 12 units subcutaneously 3 times daily with meals (0900, 1300 & 1700) & as needed via sliding scale insulin CBG <70=Notify NP/MD 71-149=0 units,150-199=4 units, 200-249=6 units,250- 299=10 units, 300-349=12 units, 350-399=15 units, > 400 call MD ( prime pen with 2 units prior  to set dose) 09/05/22  Yes Leroy Sea, MD  insulin glargine (LANTUS SOLOSTAR) 100 UNIT/ML Solostar Pen Inject 16 Units into the skin 2 (two) times daily. Patient taking differently: Inject 45 Units into the skin at bedtime. 09/05/22  Yes Leroy Sea, MD  levothyroxine (SYNTHROID) 75 MCG tablet Take 75 mcg by mouth daily before breakfast.   Yes [provider]  linagliptin (TRADJENTA) 5 MG TABS tablet Take 1 tablet (5 mg total) by mouth daily. 04/07/22  Yes Rhetta Mura, MD  LORazepam (ATIVAN) 0.5 MG tablet Take 1 tablet (0.5 mg total) by mouth at bedtime. 09/29/22  Yes Donita Brooks, MD  midodrine (PROAMATINE) 10 MG tablet Take 10 mg by mouth daily.   Yes [provider]  Nutritional Supplements (FEEDING SUPPLEMENT, NEPRO CARB STEADY,) LIQD Take 237 mLs by mouth daily. (0900)   Yes [provider]  ondansetron (ZOFRAN) 4 MG tablet Take 4 mg by mouth every 8 (eight) hours as needed for nausea.   Yes [provider]  OXYGEN Inhale 2.5 L/min into the lungs at bedtime.   Yes [provider]  polyethylene glycol (MIRALAX / GLYCOLAX) 17 g packet Take 17 g by mouth 2 (two) times daily. Patient taking differently: Take 17 g by mouth daily as needed for moderate constipation. 06/17/22  Yes [place], Eric J, DO  pregabalin (LYRICA) 150 MG capsule Take 150 mg by mouth 2 (two) times daily.   Yes [provider]  sevelamer carbonate (RENVELA) 800 MG tablet Take 1,600 mg by mouth 3  (three) times daily with meals. (0800, 1200 & 1700)   Yes [provider]  vortioxetine  HBr (TRINTELLIX) 20 MG TABS tablet Take 1 tablet (20 mg total) by mouth in the morning. (1000) 08/11/22  Yes Donita Brooks, MD  blood glucose meter kit and supplies Use up to four times daily as directed. 01/23/22   Ghimire, Werner Lean, MD  Continuous Blood Gluc Sensor (FREESTYLE LIBRE 2 SENSOR) MISC  01/14/22   [provider]  Fingerstix Lancets MISC Use as directed to check blood sugars as need up to 4 times daily 01/23/22   Ghimire, Werner Lean, MD  Insulin Pen Needle 32G X 4 MM MISC Use as directed 01/23/22   Ghimire, Werner Lean, MD  metoCLOPramide (REGLAN) 10 MG tablet Take 1 tablet (10 mg total) by mouth every 6 (six) hours as needed for nausea or vomiting. Patient not taking: Reported on 11/10/2022 09/08/22   Sherian Maroon A, PA  nystatin (MYCOSTATIN) 100000 UNIT/ML suspension Use as directed 5 mLs (500,000 Units total) in the mouth or throat 4 (four) times daily. Patient not taking: Reported on 11/10/2022 10/10/22   Donita Brooks, MD  oxyCODONE-acetaminophen (PERCOCET/ROXICET) 5-325 MG tablet Take 1 tablet by mouth every 6 (six) hours as needed. Patient not taking: Reported on 11/10/2022 08/20/22   Graceann Congress, PA-C  pantoprazole (PROTONIX) 40 MG tablet Take 1 tablet (40 mg total) by mouth 2 (two) times daily before a meal. Patient not taking: Reported on 11/10/2022 09/05/22 09/05/23  Leroy Sea, MD  pregabalin (LYRICA) 75 MG capsule Take 1 capsule (75 mg total) by mouth daily. Patient not taking: Reported on 11/10/2022 09/05/22   Leroy Sea, MD  senna-docusate (SENOKOT-S) 8.6-50 MG tablet Take 2 tablets by mouth 2 (two) times daily. Patient not taking: Reported on 11/10/2022 06/17/22   [place], Eric J, DO   No results found. - pertinent xrays, CT, MRI studies were reviewed and independently interpreted  Positive ROS: All other systems have been reviewed and were otherwise  negative with the exception of those mentioned in the HPI and as above.  Physical Exam: General: Alert, no acute distress Psychiatric: Patient is competent for consent with normal mood and affect Lymphatic: No axillary or cervical lymphadenopathy Cardiovascular: No pedal edema Respiratory: No cyanosis, no use of accessory musculature GI: No organomegaly, abdomen is soft and non-tender    Images:  @ENCIMAGES @  Labs:  Lab Results  Component Value Date   HGBA1C 7.9 (H) 08/27/2022   HGBA1C 8.9 (H) 05/02/2022   HGBA1C >15.5 (H) 01/20/2022   ESRSEDRATE 17 11/10/2022   CRP 3.6 (H) 11/10/2022   CRP 0.6 09/01/2022   CRP 0.7 08/22/2020   LABURIC 4.7 08/09/2020   LABURIC 9.1 (H) 10/12/2018   LABURIC 9.0 (H) 03/06/2018   REPTSTATUS PENDING 11/10/2022   GRAMSTAIN  05/05/2022    FEW WBC PRESENT, PREDOMINANTLY PMN NO ORGANISMS SEEN    CULT  11/10/2022    NO GROWTH < 24 HOURS Performed at [facility], [address], [place], [place] [address]    Felix Pacini MARCESCENS 05/05/2022    Lab Results  Component Value Date   ALBUMIN 3.0 (L) 11/10/2022   ALBUMIN 2.7 (L) 10/08/2022   ALBUMIN 2.9 (L) 09/08/2022   PREALBUMIN 12 (L) 11/10/2022   PREALBUMIN 13 (L) 05/02/2022   LABURIC 4.7 08/09/2020   LABURIC 9.1 (H) 10/12/2018   LABURIC 9.0 (H) 03/06/2018        Latest Ref Rng & Units 11/11/2022    1:35 AM 11/10/2022    4:49 PM 11/10/2022    1:45 PM  CBC EXTENDED  WBC 4.0 - 10.5 K/uL 6.7   7.0   RBC 3.87 - 5.11 MIL/uL 3.64   4.55   Hemoglobin 12.0 - 15.0 g/dL 8.6  16.1  09.6   HCT 04.5 - 46.0 % 28.2  37.0  36.5   Platelets 150 - 400 K/uL 138   125   NEUT# 1.7 - 7.7 K/uL   5.5   Lymph# 0.7 - 4.0 K/uL   0.7     Neurologic: Patient does not have protective sensation bilateral lower extremities.   MUSCULOSKELETAL:   Skin: Examination the wound of the lateral aspect the left calf is necrotic there were maggots within the wound.  There is no purulent  drainage.  Patient has no ascending cellulitis.  Assessment: Assessment: Necrotic wound lateral left leg with maggots.  Plan: Plan: Will plan for debridement the wound and application of Kerecis tissue graft and a cleanse choice Prevena wound VAC dressing.  Patient may require return to the operating room on Friday.  Thank you for the consult and the opportunity to see Ms. Rosalin Hawking, MD [facility] [phone] 6:45 AM

## 2022-11-12 NOTE — Progress Notes (Signed)
[facility] Progress Note   Subjective: Completed dialysis yesterday - net UF 3L. Going to OR for debridement of leg wound   Objective Vitals:   11/12/22 0014 11/12/22 0417 11/12/22 0736 11/12/22 1056  BP: (!) 120/46 (!) 132/58 (!) 121/55 120/60  Pulse: 72 67 62 65  Resp: 17 17 17 17   Temp: 98.6 F (37 C) 98.2 F (36.8 C) 98.1 F (36.7 C) 98.6 F (37 C)  TempSrc:   Oral Oral  SpO2: 99% 100% 99% 97%  Weight:    100 kg  Height:    5\' 5"  (1.651 m)     Additional Objective Labs: Basic Metabolic Panel: Recent Labs  Lab 11/10/22 1345 11/10/22 1649 11/10/22 2200 11/11/22 0135 11/12/22 0905  NA 129* 129*  --  130* 134*  K 3.8 3.1*  --  3.2* 3.0*  CL 93* 92*  --  93* 96*  CO2 20*  --   --  21* 26  GLUCOSE 334* 477* 533* 486* 335*  BUN 56* 51*  --  65* 51*  CREATININE 4.03* 4.20*  --  4.12* 3.79*  CALCIUM 8.9  --   --  8.4* 8.5*  PHOS  --   --   --   --  5.8*   CBC: Recent Labs  Lab 11/10/22 1345 11/10/22 1649 11/11/22 0135 11/12/22 0905  WBC 7.0  --  6.7 5.3  NEUTROABS 5.5  --   --  3.7  HGB 11.0* 12.6 8.6* 9.0*  HCT 36.5 37.0 28.2* 29.8*  MCV 80.2  --  77.5* 77.8*  PLT 125*  --  138* 127*   Blood Culture    Component Value Date/Time   SDES BLOOD RIGHT FOREARM 11/10/2022 1641   SPECREQUEST  11/10/2022 1641    BOTTLES DRAWN AEROBIC AND ANAEROBIC Blood Culture adequate volume   CULT  11/10/2022 1641    NO GROWTH 2 DAYS Performed at [facility], [address] [place], [place] [address]    REPTSTATUS PENDING 11/10/2022 1641     Physical Exam General: Lying in bed, nad  Heart: RRR  Lungs: Clear bilaterally  Abdomen: soft non-tender  Extremities: R BKA, LLE bandaged 1+edema  Dialysis Access: R TDC; L AVF no bruit   Medications:  sodium chloride     [MAR Hold] piperacillin-tazobactam (ZOSYN)  IV 2.25 g (11/12/22 0524)   vancomycin     [MAR Hold] vancomycin      [MAR Hold] (feeding supplement) PROSource Plus  30 mL Oral BID  BM   [MAR Hold] amiodarone  200 mg Oral Daily   [MAR Hold] apixaban  5 mg Oral BID   [MAR Hold] ascorbic acid  500 mg Oral Daily   [MAR Hold] atorvastatin  80 mg Oral Daily   chlorhexidine  60 mL Topical Once   chlorhexidine  15 mL Mouth/Throat Once   Or   mouth rinse  15 mL Mouth Rinse Once   [MAR Hold] Chlorhexidine Gluconate Cloth  6 each Topical Q0600   [MAR Hold] Chlorhexidine Gluconate Cloth  6 each Topical Daily   [MAR Hold] docusate sodium  100 mg Oral BID   [MAR Hold] feeding supplement (NEPRO CARB STEADY)  237 mL Oral Daily   [MAR Hold] folic acid  1 mg Oral Daily   [MAR Hold] insulin aspart  0-5 Units Subcutaneous QHS   [MAR Hold] insulin aspart  0-6 Units Subcutaneous TID WC   [MAR Hold] insulin aspart  3 Units Subcutaneous TID WC   [  MAR Hold] insulin glargine-yfgn  30 Units Subcutaneous BID   [MAR Hold] levothyroxine  75 mcg Oral QAC breakfast   [MAR Hold] LORazepam  0.5 mg Oral QHS   [MAR Hold] melatonin  3 mg Oral QHS   [MAR Hold] midodrine  10 mg Oral Q T,Th,Sa-HD   [MAR Hold] mupirocin ointment  1 Application Nasal BID   [MAR Hold] pantoprazole  40 mg Oral BID AC   povidone-iodine  2 Application Topical Once   [MAR Hold] pregabalin  150 mg Oral BID   [MAR Hold] senna-docusate  2 tablet Oral BID   [MAR Hold] sevelamer carbonate  1,600 mg Oral TID WC   [MAR Hold] sodium hypochlorite   Irrigation BID   [MAR Hold] venlafaxine XR  37.5 mg Oral Q breakfast   Followed by   [ZOX Hold] venlafaxine XR  75 mg Oral Q breakfast   [MAR Hold] vortioxetine HBr  10 mg Oral Daily   [MAR Hold] vortioxetine HBr  20 mg Oral q AM   [MAR Hold] zinc sulfate  220 mg Oral Daily    Dialysis Orders:  [facility] TTS 4:00 400/800 EDW 91.5kg 2K/2Ca TDC Heparin 4000 -Mircera 75  - last 6/8 -Calcitriol 0.5 three times per week     Assessment/Plan: Infected left lower extremity wound - maggot infestation on admission. Blood cultures neg to date. IV  Vanco/Zosyn per primary. Ortho following - for  surgical debridement today ESRD -  HD TTS. Next HD 6/20. 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 For access surgery next month  Hypertension/volume  - BP controlled. Does have some volume on exam. UF as able.  Anemia  - Hgb 9.0 Next ESA due 6/22  Metabolic bone disease -  Ca/Phos ok. Continue home binders with diet.  Nutrition - Renal diet with fluid restriction.  T2DM - poorly controlled. Insulin per primary     Tomasa Blase PA-C [facility] 11/12/2022,11:05 AM

## 2022-11-12 NOTE — Consult Note (Signed)
Susan [name] Psychiatry Consult Evaluation  Service Date: November 12, 2022 LOS:  LOS: 2 days    Primary Psychiatric Diagnoses  MDD, recurrent, severe 2.  GAD w/ panic  Assessment  Susan [name] is a 72 y.o. female admitted medically for 11/10/2022 12:29 PM for a maggot infested wound. She carries the psychiatric diagnoses of MDD and anxiety and has a past medical history fully listed below; most significant for uncontrolled diabetes, venous stasis ulcer, and BKA. Psychiatry was consulted for positive [place] by Dr. Ophelia [name].   Her current presentation of poor sleep, low mood, feelings of worthlessness, anhedonia, low energy is most consistent with known diagnosis of MDD. She meets criteria for GAD with anxious distress based on reports of ruminative thoughts and panic attacks (does not meet avoidant criteria for panic d/o. Her recent decompensation is clearly tied to loss of independence and medical illness (causually and temporally) and, in addition to pharmacotherapy outlined below, she would benefit from aggressive physical therapy. Counseled on gun safety at time of initial consult 6/18. She consented to medication changes as below after discussion of r/b/se. I had wanted to start duloxetine but this is not generally used in people with ESRD. She was engaged with care and is treatment-focused (came here appropriately for venous stasis ulcer). She is currently appropriate for outpt management of MDD.   6/19: No SI; future oriented. No effect or s/e of meds as yet. See x1 on F 6/21 d/t crosstaper and likely s/o.   Diagnoses:  Active Hospital problems: Principal Problem:   Complicated wound infection Active Problems:   Hyperlipemia   Hypotension   Impaired ambulation   Hypothyroidism   Prolonged QT interval   Type 2 diabetes mellitus with diabetic polyneuropathy, with long-term current use of insulin (HCC)   Obesity, Class III, BMI 40-49.9 (morbid obesity) (HCC)   Paroxysmal atrial  fibrillation (HCC)   S/P BKA (below knee amputation) unilateral, right (HCC)   ESRD on dialysis (HCC)   Maggot infestation     Plan   ## Psychiatric Medication Recommendations:  Depression, anxiety -- CROSSTAPER vortioxetine --> venlafaxine as below. May adjust depending on response  -- 3 days of vortioxetine 20 + venlafaxine 37.5  -- 3 days of vortioxetine 10 + venlafaxine 75 -- then venlafaxine 75 mg   Future thoughts: ?inc venlafaxine, would go to about 150 before deciding ineffective in this ESRD pt although may see effect at current target of 75  ## Medical Decision Making Capacity:  Not formally assessed   ## Further Work-up:  -- none currently     -- most recent EKG on 6/18 had QtC of 532 in setting of RBBB; JTc corrects to <400  -- Pertinent labwork reviewed earlier this admission includes: generally elevated blood sugar, TSH slightly elevated in 09/2022  ## Disposition:  -- There are no current psychiatric contraindications to discharge -- needs referrals to psych, therapy upon dc.   ## Behavioral / Environmental:  --  Utilize compassion and acknowledge the patient's experiences while setting clear and realistic expectations for care.    ## Safety and Observation Level:  - Based on my clinical evaluation, I estimate the patient to be at low risk of self harm in the current setting - At this time, we recommend a routine level of observation. This decision is based on my review of the chart including patient's history and current presentation, interview of the patient, mental status examination, and consideration of suicide risk including evaluating suicidal ideation, plan,  intent, suicidal or self-harm behaviors, risk factors, and protective factors. This judgment is based on our ability to directly address suicide risk, implement suicide prevention strategies and develop a safety plan while the patient is in the clinical setting. Please contact our team if there is a  concern that risk level has changed.  Susan [name] A Susan [name]  Psychiatric and Social History   Relevant Aspects of Hospital Course:  Admitted on 11/10/2022 for venous stasis ulcer infested with maggots  Patient Report:  Patient seen in AM. Her husband is at bedside; she consents to have him remain in the room but is clearly hesitant to talk. She is looking forward to getting more information after surgery today about viability of leg. Her main goal is to regain mobility/independence and she is looking forward to getting her prosthesis fitted and potentially walking again. Slept quite well last night but again got oxycodone.  She is hopeful she will qualify for CIR but fairly realistic about insurance issues. Did not ask about appetite (NPO). No SI, HI, not RIS.   Psych ROS:  Depression: + difficulty sleeping, anhedonia (cut off from most activities d/t mobility), worthlessness, decreased energy. Appetite was good at home but doesn't like hospital food. + passive SI Anxiety:  episodes of panic (can't breathe, HR, sweats). Can go months without in good months - more recently a couple of times a week. Also ruminative thoughts. No real social anxiety.  Mania (lifetime and current): denies Psychosis: (lifetime and current): denies (joked about phantom limb pain here)  Collateral information:  Was not given permission to contact  Psychiatric History:  Information collected from pt, medical record  Prev Dx/Sx: Depression, anxiety Current Psych Provider: PCP Home Meds (current): trintillex 20 mg, lorazepam 0.5 mg QHS Previous Med Trials: lexapro, zoloft, prozac (briefly effective), wellbutrin (unclear efficacy).  Therapy: no   Prior Psych Hospitalization: no  Prior Self Harm: no Prior Violence: no  Family Psych History: Son attempted suicide via GSW to head, now disabled.    Social History:   Educational Hx: high school Occupational Hx: office Legal Hx: no Living Situation: with  husband of 24 years, son 3 days/week Spiritual Hx: yes, but not religious Access to weapons: yes - has guns in bedroom - pt states she cannot reach  Substance History Tobacco use: quit 20+ y ears ago Alcohol use: no Drug use: no   Exam Findings   Psychiatric Specialty Exam:  Presentation  General Appearance: Appropriate for Environment; Casual Eye Contact:Good Speech:Clear and Coherent Speech Volume:Normal Handedness:No data recorded  Mood and Affect  Mood:-- (Hopeful) Affect:Appropriate; Full Range  Thought Process  Thought Processes:Coherent; Goal Directed Descriptions of Associations:Intact Orientation:Full (Time, Place and Person) Thought Content:-- (devoid of delusions, paranoia) Hallucinations:Hallucinations: None Ideas of Reference:None Suicidal Thoughts:Suicidal Thoughts: No Homicidal Thoughts:Homicidal Thoughts: No  Sensorium  Memory:Immediate Good; Recent Good; Remote Good Judgment:Fair Insight:Good  Executive Functions  Concentration:Fair Attention Span:Fair Recall:Good Fund of Knowledge:Good Language:Good  Psychomotor Activity  Psychomotor Activity:Psychomotor Activity: Normal  Assets  Assets:Communication Skills; Desire for Improvement; Housing  Sleep  Sleep:Sleep: Fair   Physical Exam: Vital signs:  Temp:  [98 F (36.7 C)-98.6 F (37 C)] 98.6 F (37 C) (06/19 1056) Pulse Rate:  [62-79] 65 (06/19 1056) Resp:  [11-21] 17 (06/19 1056) BP: (108-147)/(45-63) 120/60 (06/19 1056) SpO2:  [90 %-100 %] 97 % (06/19 1056) Weight:  [94.4 kg-100 kg] 100 kg (06/19 1056) Physical Exam HENT:     Head: Normocephalic.  Eyes:     Conjunctiva/sclera: Conjunctivae  normal.  Pulmonary:     Effort: Pulmonary effort is normal.  Neurological:     Mental Status: She is alert.     Blood pressure 120/60, pulse 65, temperature 98.6 F (37 C), temperature source Oral, resp. rate 17, height 5\' 5"  (1.651 m), weight 100 kg, SpO2 97 %. Body mass index is  36.69 kg/m.   Other History   These have been pulled in through the EMR, reviewed, and updated if appropriate.   Family History:  The patient's family history includes Heart attack (age of onset: 9) in her mother.  Medical History: Past Medical History:  Diagnosis Date   Anemia    hx   Anxiety    Arthritis    "generalized" (03/15/2014)   CAD (coronary artery disease)    MI in 2000 - MI  2007 - treated bare metal stent (no nuclear since then as 9/11)   Carotid artery disease (HCC)    Chronic diastolic heart failure (HCC)    a) ECHO (08/2013) EF 55-60% and RV function nl b) RHC (08/2013) RA 4, RV 30/5/7, PA 25/10 (16), PCWP 7, Fick CO/CI 6.3/2.7, PVR 1.5 WU, PA 61 and 66%   Daily headache    "~ every other day; since I fell in June" (03/15/2014)   Depression    Dyslipidemia    ESRD (end stage renal disease) (HCC)    Dialysis on Tues Thurs Sat   Exertional shortness of breath    History of kidney stones    HTN (hypertension)    Hypothyroidism    Obesity    Osteoarthritis    PAF (paroxysmal atrial fibrillation) (HCC)    Peripheral neuropathy    bilateral feet/hands   PONV (postoperative nausea and vomiting)    RBBB (right bundle branch block)    Old   Stroke (HCC)    mini strokes   Type II diabetes mellitus (HCC)    Type II, [name]. patient has omnipod insulin pump with Novolin R Insulin    Surgical History: Past Surgical History:  Procedure Laterality Date   A/V FISTULAGRAM Left 11/07/2022   Procedure: A/V Fistulagram;  Surgeon: Leonie Douglas, MD;  [facility];  Service: Cardiovascular;  Laterality: Left;   ABDOMINAL HYSTERECTOMY  1980's   AMPUTATION Right 02/24/2018   Procedure: RIGHT FOOT GREAT TOE AND 2ND TOE AMPUTATION;  Surgeon: Nadara Mustard, MD;  Location: [facility] OR;  Service: Orthopedics;  Laterality: Right;   AMPUTATION Right 04/30/2018   Procedure: RIGHT TRANSMETATARSAL AMPUTATION;  Surgeon: Nadara Mustard, MD;   Location: [facility] OR;  Service: Orthopedics;  Laterality: Right;   AMPUTATION Right 05/02/2022   Procedure: RIGHT BELOW KNEE AMPUTATION;  Surgeon: Nadara Mustard, MD;  Location: [facility] OR;  Service: Orthopedics;  Laterality: Right;   APPLICATION OF WOUND VAC Right 06/13/2022   Procedure: APPLICATION OF WOUND VAC;  Surgeon: Nadara Mustard, MD;  Location: [facility] OR;  Service: Orthopedics;  Laterality: Right;   AV FISTULA PLACEMENT Left 04/02/2022   Procedure: LEFT ARM ARTERIOVENOUS (AV) FISTULA CREATION;  Surgeon: Nada Libman, MD;  [facility]: [facility] OR;  Service: Vascular;  Laterality: Left;  PERIPHERAL NERVE BLOCK   BASCILIC VEIN TRANSPOSITION Left 07/31/2022   Procedure: LEFT ARM SECOND STAGE BASILIC VEIN TRANSPOSITION;  Surgeon: Nada Libman, MD;  [facility]: [facility] OR;  Service: Vascular;  Laterality: Left;   BIOPSY  05/27/2020   Procedure: BIOPSY;  Surgeon: Lanelle Bal, DO;  Location: [facility] ENDO SU[facility];  Service: Endoscopy;;   CATARACT EXTRACTION, BILATERAL Bilateral ?2013   COLONOSCOPY W/ POLYPECTOMY     COLONOSCOPY WITH PROPOFOL N/A 03/13/2019   Procedure: COLONOSCOPY WITH PROPOFOL;  Surgeon: Beverley Fiedler, MD;  Location: [facility] ENDOSCOPY;  Service: Gastroenterology;  Laterality: N/A;   CORONARY ANGIOPLASTY WITH STENT PLACEMENT  1999; 2007   "1 + 1"   ERCP N/A 02/03/2022   Procedure: ENDOSCOPIC RETROGRADE CHOLANGIOPANCREATOGRAPHY (ERCP);  Surgeon: Jeani Hawking, MD;  Location: [facility] ENDOSCOPY;  Service: Gastroenterology;  Laterality: N/A;   ESOPHAGOGASTRODUODENOSCOPY (EGD) WITH PROPOFOL N/A 03/13/2019   Procedure: ESOPHAGOGASTRODUODENOSCOPY (EGD) WITH PROPOFOL;  Surgeon: Beverley Fiedler, MD;  Location: [facility] ENDOSCOPY;  Service: Gastroenterology;  Laterality: N/A;   ESOPHAGOGASTRODUODENOSCOPY (EGD) WITH PROPOFOL N/A 05/27/2020   Procedure: ESOPHAGOGASTRODUODENOSCOPY (EGD) WITH PROPOFOL;  Surgeon: Lanelle Bal, DO;  [facility];  Service: Endoscopy;  Laterality: N/A;   ESOPHAGOGASTRODUODENOSCOPY (EGD) WITH  PROPOFOL N/A 09/03/2022   Procedure: ESOPHAGOGASTRODUODENOSCOPY (EGD) WITH PROPOFOL;  Surgeon: Jeani Hawking, MD;  Location: [facility] ENDOSCOPY;  Service: Gastroenterology;  Laterality: N/A;   EYE SURGERY Bilateral    lazer   FLEXIBLE SIGMOIDOSCOPY N/A 05/23/2022   Procedure: FLEXIBLE SIGMOIDOSCOPY;  Surgeon: Jeani Hawking, MD;  Location: [facility] ENDOSCOPY;  Service: Gastroenterology;  Laterality: N/A;   FLEXIBLE SIGMOIDOSCOPY N/A 05/24/2022   Procedure: FLEXIBLE SIGMOIDOSCOPY;  Surgeon: Imogene Burn, MD;  Location: [facility] ENDOSCOPY;  Service: Gastroenterology;  Laterality: N/A;   FLEXIBLE SIGMOIDOSCOPY N/A 09/03/2022   Procedure: FLEXIBLE SIGMOIDOSCOPY;  Surgeon: Jeani Hawking, MD;  Location: [facility] ENDOSCOPY;  Service: Gastroenterology;  Laterality: N/A;   HEMOSTASIS CLIP PLACEMENT  03/13/2019   Procedure: HEMOSTASIS CLIP PLACEMENT;  Surgeon: Beverley Fiedler, MD;  Location: [facility] ENDOSCOPY;  Service: Gastroenterology;;   HEMOSTASIS CLIP PLACEMENT  05/23/2022   Procedure: HEMOSTASIS CLIP PLACEMENT;  Surgeon: Jeani Hawking, MD;  Location: [facility] ENDOSCOPY;  Service: Gastroenterology;;   HEMOSTASIS CONTROL  05/24/2022   Procedure: HEMOSTASIS CONTROL;  Surgeon: Imogene Burn, MD;  Location: [facility] ENDOSCOPY;  Service: Gastroenterology;;   HOT HEMOSTASIS N/A 05/23/2022   Procedure: HOT HEMOSTASIS (ARGON PLASMA COAGULATION/BICAP);  Surgeon: Jeani Hawking, MD;  Location: [facility] ENDOSCOPY;  Service: Gastroenterology;  Laterality: N/A;   I & D EXTREMITY Left 05/05/2022   Procedure: IRRIGATION AND DEBRIDEMENT LEFT ARM AV FISTULA;  Surgeon: Cephus Shelling, MD;  [facility]: [facility] OR;  Service: Vascular;  Laterality: Left;   INSERTION OF DIALYSIS CATHETER Right 04/02/2022   Procedure: INSERTION OF TUNNELED DIALYSIS CATHETER;  Surgeon: Nada Libman, MD;  Location: [facility] OR;  Service: Vascular;  Laterality: Right;   KNEE ARTHROSCOPY Left 10/25/2006   POLYPECTOMY  03/13/2019   Procedure: POLYPECTOMY;  Surgeon: Beverley Fiedler, MD;  Location:  [facility] ENDOSCOPY;  Service: Gastroenterology;;   REMOVAL OF STONES  02/03/2022   Procedure: REMOVAL OF STONES;  Surgeon: Jeani Hawking, MD;  Location: [facility] ENDOSCOPY;  Service: Gastroenterology;;   REVISON OF ARTERIOVENOUS FISTULA Left 08/20/2022   Procedure: REVISON OF LEFT ARM ARTERIOVENOUS FISTULA;  Surgeon: Nada Libman, MD;  [facility] OR;  Service: Vascular;  Laterality: Left;   RIGHT HEART CATH N/A 07/24/2017   Procedure: RIGHT HEART CATH;  Surgeon: Dolores Patty, MD;  [facility];  Service: Cardiovascular;  Laterality: N/A;   RIGHT HEART CATHETERIZATION N/A 09/22/2013   Procedure: RIGHT HEART CATH;  Surgeon: Dolores Patty, MD;  Location: [facility] CATH LAB;  Service: Cardiovascular;  Laterality: N/A;   SHOULDER ARTHROSCOPY WITH OPEN ROTATOR CUFF REPAIR Right 03/14/2014  Procedure: RIGHT SHOULDER ARTHROSCOPY WITH BICEPS RELEASE, OPEN SUBSCAPULA REPAIR, OPEN SUPRASPINATUS REPAIR.;  Surgeon: Cammy Copa, MD;  Location: [facility] OR;  Service: Orthopedics;  Laterality: Right;   SPHINCTEROTOMY  02/03/2022   Procedure: SPHINCTEROTOMY;  Surgeon: Jeani Hawking, MD;  Location: [facility] ENDOSCOPY;  Service: Gastroenterology;;   STUMP REVISION Right 06/13/2022   Procedure: REVISION RIGHT BELOW KNEE AMPUTATION;  Surgeon: Nadara Mustard, MD;  Location: [facility] OR;  Service: Orthopedics;  Laterality: Right;   TEE WITHOUT CARDIOVERSION N/A 02/04/2022   Procedure: TRANSESOPHAGEAL ECHOCARDIOGRAM (TEE);  Surgeon: Dolores Patty, MD;  Location: [facility] ENDOSCOPY;  Service: Cardiovascular;  Laterality: N/A;   THROMBECTOMY W/ EMBOLECTOMY Left 08/20/2022   Procedure: THROMBECTOMY OF LEFT ARM ARTERIOVENOUS FISTULA;  Surgeon: Nada Libman, MD;  [facility] OR;  Service: Vascular;  Laterality: Left;   TOE AMPUTATION Right 02/24/2018   GREAT TOE AND 2ND TOE AMPUTATION   TUBAL LIGATION  1970's    Medications:   Current Facility-Administered Medications:    (feeding supplement) PROSource Plus liquid 30  mL, 30 mL, Oral, BID BM, Jonah Blue, MD, 30 mL at 11/12/22 1610   acetaminophen (TYLENOL) tablet 650 mg, 650 mg, Oral, Q6H PRN, 650 mg at 11/12/22 0921 **OR** acetaminophen (TYLENOL) suppository 650 mg, 650 mg, Rectal, Q6H PRN, Jonah Blue, MD   amiodarone (PACERONE) tablet 200 mg, 200 mg, Oral, Daily, Jonah Blue, MD, 200 mg at 11/12/22 9604   apixaban (ELIQUIS) tablet 5 mg, 5 mg, Oral, BID, Jonah Blue, MD, 5 mg at 11/11/22 1012   ascorbic acid (VITAMIN C) tablet 500 mg, 500 mg, Oral, Daily, Jonah Blue, MD, 500 mg at 11/12/22 5409   atorvastatin (LIPITOR) tablet 80 mg, 80 mg, Oral, Daily, Jonah Blue, MD, 80 mg at 11/12/22 8119   bisacodyl (DULCOLAX) EC tablet 5 mg, 5 mg, Oral, Daily PRN, Jonah Blue, MD   calcium carbonate (dosed in mg elemental calcium) suspension 500 mg of elemental calcium, 500 mg of elemental calcium, Oral, Q6H PRN, Jonah Blue, MD   camphor-menthol [facility]) lotion 1 Application, 1 Application, Topical, Q8H PRN **AND** hydrOXYzine (ATARAX) tablet 25 mg, 25 mg, Oral, Q8H PRN, Jonah Blue, MD   Chlorhexidine Gluconate Cloth 2 % PADS 6 each, 6 each, Topical, Q0600, Bufford Buttner, MD   Chlorhexidine Gluconate Cloth 2 % PADS 6 each, 6 each, Topical, Daily, Jonah Blue, MD, 6 each at 11/11/22 1307   docusate sodium (COLACE) capsule 100 mg, 100 mg, Oral, BID, Jonah Blue, MD, 100 mg at 11/12/22 1478   docusate sodium (ENEMEEZ) enema 283 mg, 1 enema, Rectal, PRN, Jonah Blue, MD   feeding supplement (NEPRO CARB STEADY) liquid 237 mL, 237 mL, Oral, Daily, Jonah Blue, MD, 237 mL at 11/11/22 1033   folic acid (FOLVITE) tablet 1 mg, 1 mg, Oral, Daily, Jonah Blue, MD, 1 mg at 11/12/22 2956   hydrALAZINE (APRESOLINE) injection 5 mg, 5 mg, Intravenous, Q4H PRN, Jonah Blue, MD   insulin aspart (novoLOG) 100 UNIT/ML injection, , , ,    insulin aspart (novoLOG) injection 0-5 Units, 0-5 Units, Subcutaneous, QHS, Jonah Blue,  MD   insulin aspart (novoLOG) injection 0-6 Units, 0-6 Units, Subcutaneous, TID WC, Jonah Blue, MD, 4 Units at 11/12/22 0859   insulin aspart (novoLOG) injection 3 Units, 3 Units, Subcutaneous, TID WC, Jonah Blue, MD, 3 Units at 11/11/22 1200   insulin glargine-yfgn (SEMGLEE) injection 30 Units, 30 Units, Subcutaneous, BID, Jonah Blue, MD, 30 Units at 11/12/22 2130   levothyroxine (SYNTHROID) tablet 75 mcg, 75  mcg, Oral, QAC breakfast, Jonah Blue, MD, 75 mcg at 11/12/22 0522   LORazepam (ATIVAN) tablet 0.5 mg, 0.5 mg, Oral, QHS, Jonah Blue, MD, 0.5 mg at 11/11/22 2236   melatonin tablet 3 mg, 3 mg, Oral, QHS, Mckensie Scotti A, 3 mg at 11/11/22 2034   midodrine (PROAMATINE) tablet 10 mg, 10 mg, Oral, Q T,Th,Sa-HD, Jonah Blue, MD   mupirocin ointment (BACTROBAN) 2 % 1 Application, 1 Application, Nasal, BID, Jonah Blue, MD   oxyCODONE (Oxy IR/ROXICODONE) immediate release tablet 5 mg, 5 mg, Oral, Q4H PRN, Jonah Blue, MD, 5 mg at 11/11/22 2243   pantoprazole (PROTONIX) EC tablet 40 mg, 40 mg, Oral, BID Magda Paganini, MD, 40 mg at 11/12/22 0921   piperacillin-tazobactam (ZOSYN) IVPB 2.25 g, 2.25 g, Intravenous, Q8H, Jonah Blue, MD, Last Rate: 100 mL/hr at 11/12/22 0524, 2.25 g at 11/12/22 0524   polyethylene glycol (MIRALAX / GLYCOLAX) packet 17 g, 17 g, Oral, Daily PRN, Jonah Blue, MD   potassium chloride SA (KLOR-CON M) CR tablet 20 mEq, 20 mEq, Oral, Once, Ejigiri, Ogechi Grace, PA-C   pregabalin (LYRICA) capsule 150 mg, 150 mg, Oral, BID, Jonah Blue, MD, 150 mg at 11/12/22 1610   senna-docusate (Senokot-S) tablet 2 tablet, 2 tablet, Oral, BID, Jonah Blue, MD, 2 tablet at 11/12/22 9604   sevelamer carbonate (RENVELA) tablet 1,600 mg, 1,600 mg, Oral, TID WC, Jonah Blue, MD, 1,600 mg at 11/12/22 5409   sodium hypochlorite (DAKIN'S 1/4 STRENGTH) topical solution, , Irrigation, BID, Jonah Blue, MD, Given at 11/11/22 2309    sorbitol 70 % solution 30 mL, 30 mL, Oral, PRN, Jonah Blue, MD   vancomycin (VANCOCIN) IVPB 1000 mg/200 mL premix, 1,000 mg, Intravenous, Q T,Th,Sa-HD, Rexford Maus, RPH   venlafaxine XR (EFFEXOR-XR) 24 hr capsule 37.5 mg, 37.5 mg, Oral, Q breakfast, 37.5 mg at 11/12/22 0921 **FOLLOWED BY** [START ON 11/14/2022] venlafaxine XR (EFFEXOR-XR) 24 hr capsule 75 mg, 75 mg, Oral, Q breakfast, Donia Yokum, Sheela Stack ON 11/14/2022] vortioxetine HBr (TRINTELLIX) tablet 10 mg, 10 mg, Oral, Daily, Loren Vicens A   vortioxetine HBr (TRINTELLIX) tablet 20 mg, 20 mg, Oral, q AM, Anasophia Pecor A, 20 mg at 11/12/22 8119   zinc sulfate capsule 220 mg, 220 mg, Oral, Daily, Jonah Blue, MD, 220 mg at 11/12/22 1478   zolpidem (AMBIEN) tablet 5 mg, 5 mg, Oral, QHS PRN, Jonah Blue, MD  Allergies: Allergies  Allergen Reactions   Cephalexin Diarrhea and Other (See Comments)   Codeine Nausea And Vomiting and Other (See Comments)

## 2022-11-12 NOTE — Progress Notes (Signed)
PROGRESS NOTE    Susan [name]  ZOX:[id number] DOB: February 11, 1951 DOA: 11/10/2022 PCP: Donita Brooks, MD    No chief complaint on file.   Brief Narrative:  72yo female with h/o CAD, chronic diastolic CHF, HLD, TTS HD, HTN, PVD s/p R BKA, hypothyroidism, morbid obesity, afib, and DM presenting with maggot infestation of her LLE. Dr. Lajoyce Corners sent her to the ER and plans for surgical debridement later this week. She was started on Vanc/Zosyn.    Assessment & Plan:   Principal Problem:   Complicated wound infection Active Problems:   Type 2 diabetes mellitus with diabetic polyneuropathy, with long-term current use of insulin (HCC)   ESRD on dialysis (HCC)   Paroxysmal atrial fibrillation (HCC)   Hyperlipemia   Hypotension   Impaired ambulation   Hypothyroidism   Prolonged QT interval   Obesity, Class III, BMI 40-49.9 (morbid obesity) (HCC)   S/P BKA (below knee amputation) unilateral, right (HCC)   Maggot infestation  #1 left lower extremity wound infection/necrotic wound lateral left leg with maggots -Patient noted to have failed conservative outpatient therapy and recommended for admission by orthopedics, Dr. Lajoyce Corners. -Patient seen in consultation by Dr. Lajoyce Corners and plan for debridement of the wound and application of Kerecis tissue graft and a cleanse choice Prevena wound VAC dressing. -Patient was to have debridement done today but postponed till Friday. -Continue empiric IV antibiotics of vancomycin and Zosyn. -Per orthopedics.  2.  ESRD on HD TTS -Patient noted with thrombosed aVF felt not salvageable per nephrology and patient for planned outpatient follow-up in 3 to 4 weeks. -Nephrology consulted and following.  3.  Diabetes mellitus type 2 -Hemoglobin A1c 7.9 (08/27/2022). -CBG 319 this morning. -Patient was n.p.o. in anticipation of possible procedure however procedure canceled and patient placed back on a diet. -Continue current regimen of Semglee 30 units twice  daily. -Discontinue meal coverage NovoLog for now. -SSI.  4.  Atrial fibrillation -Continue amiodarone for rate control. -Eliquis for anticoagulation.  5.  History of right renal infarct, splenic infarct -In the setting of underlying A-fib. -Continue Eliquis for anticoagulation.  6.  Hypothyroidism -Synthroid.  7.  Hypotension -Midodrine.  8.  PVD status post right BKA -Continue statin, Eliquis.  9.  Morbid obesity -BMI 41.77 -Lifestyle modification. -Outpatient follow-up with PCP.  10.  Ambulatory dysfunction -Patient noted unable to bear weight to assist with transfers since BKA. -Being fitted for prosthesis. -PT/OT. -May be on a candidate for CIR postop.  11.  Prolonged QTc -Repeat EKG pending.  12.  MDD/GAD with panic  -Patient seen in consultation by psychiatry -Patient currently on  vortioxetine and been weaned to venlafaxine per psychiatry. -Psychiatry following and appreciate their input and recommendations.  13.  Stage II pressure injury of the sacrum, POA Pressure Injury 08/27/22 Coccyx Mid Stage 2 -  Partial thickness loss of dermis presenting as a shallow open injury with a red, pink wound bed without slough. 20mmx5mmx0 red center unattached edges (Active)  08/27/22 1000  Location: Coccyx  Location Orientation: Mid  Staging: Stage 2 -  Partial thickness loss of dermis presenting as a shallow open injury with a red, pink wound bed without slough.  Wound Description (Comments): 18mmx5mmx0 red center unattached edges  Present on Admission:   WOC RN consulted.    -   DVT prophylaxis: Eliquis Code Status: Full Family Communication: Updated patient and husband at bedside. Disposition: TBD  Status is: Inpatient Remains inpatient appropriate because: Severity of illness   Consultants:  WOC RN: Ria Bush, RN 11/10/2022 Nephrology: Dr. Signe Colt 11/11/2022 Psychiatry: Dr. Gasper Sells 11/11/2022 Orthopedics: Dr. Lajoyce Corners 11/12/2022  Procedures:   None  Antimicrobials:  Anti-infectives (From admission, onward)    Start     Dose/Rate Route Frequency Ordered Stop   11/12/22 1145  vancomycin (VANCOCIN) IVPB 1000 mg/200 mL premix  Status:  Discontinued        1,000 mg 200 mL/hr over 60 Minutes Intravenous On call to O.R. 11/12/22 1052 11/12/22 1055   11/12/22 1145  levofloxacin (LEVAQUIN) IVPB 500 mg  Status:  Discontinued        500 mg 100 mL/hr over 60 Minutes Intravenous On call to O.R. 11/12/22 1052 11/12/22 1055   11/12/22 1054  vancomycin (VANCOCIN) 1-5 GM/200ML-% IVPB  Status:  Discontinued       Note to Pharmacy: [facility], [place]: cabinet override      11/12/22 1054 11/12/22 1114   11/12/22 0815  vancomycin (VANCOCIN) IVPB 1000 mg/200 mL premix        1,000 mg 200 mL/hr over 60 Minutes Intravenous  Once 11/12/22 0718 11/12/22 0958   11/11/22 1354  vancomycin (VANCOCIN) IVPB 1000 mg/200 mL premix        1,000 mg 200 mL/hr over 60 Minutes Intravenous Every T-Th-Sa (Hemodialysis) 11/11/22 1354     11/10/22 1700  piperacillin-tazobactam (ZOSYN) IVPB 2.25 g        2.25 g 100 mL/hr over 30 Minutes Intravenous Every 8 hours 11/10/22 1645     11/10/22 1645  vancomycin (VANCOREADY) IVPB 2000 mg/400 mL        2,000 mg 200 mL/hr over 120 Minutes Intravenous  Once 11/10/22 1632 11/11/22 1930         Subjective: Sleeping but arousable.  Denies any chest pain or shortness of breath.  States was to have debridement early on this morning labs but was brought back up to the room due to an emergency and has been postponed.  Pain currently controlled on current pain regimen.  Objective: Vitals:   11/12/22 0417 11/12/22 0736 11/12/22 1056 11/12/22 1337  BP: (!) 132/58 (!) 121/55 120/60 (!) 118/58  Pulse: 67 62 65 66  Resp: 17 17 17 17   Temp: 98.2 F (36.8 C) 98.1 F (36.7 C) 98.6 F (37 C) 98.3 F (36.8 C)  TempSrc:  Oral Oral Oral  SpO2: 100% 99% 97% 100%  Weight:   100 kg   Height:   5\' 5"  (1.651 m)     Intake/Output  Summary (Last 24 hours) at 11/12/2022 1830 Last data filed at 11/12/2022 1531 Gross per 24 hour  Intake 521.88 ml  Output 3 ml  Net 518.88 ml   Filed Weights   11/11/22 1449 11/12/22 1056  Weight: 94.4 kg 100 kg    Examination:  General exam: Appears calm and comfortable  Respiratory system: Clear to auscultation. Respiratory effort normal. Cardiovascular system: S1 & S2 heard, RRR. No JVD, murmurs, rubs, gallops or clicks. No pedal edema. Gastrointestinal system: Abdomen is nondistended, soft and nontender. No organomegaly or masses felt. Normal bowel sounds heard. Central nervous system: Alert and oriented. No focal neurological deficits. Extremities: Right lower extremity bandaged.  Status post right BKA. Skin: No rashes, lesions or ulcers Psychiatry: Judgement and insight appear normal. Mood & affect appropriate.     Data Reviewed: I have personally reviewed following labs and imaging studies  CBC: Recent Labs  Lab 11/07/22 0833 11/10/22 1345 11/10/22 1649 11/11/22 0135 11/12/22 0905  WBC  --  7.0  --  6.7 5.3  NEUTROABS  --  5.5  --   --  3.7  HGB 11.9* 11.0* 12.6 8.6* 9.0*  HCT 35.0* 36.5 37.0 28.2* 29.8*  MCV  --  80.2  --  77.5* 77.8*  PLT  --  125*  --  138* 127*    Basic Metabolic Panel: Recent Labs  Lab 11/07/22 0833 11/10/22 1345 11/10/22 1649 11/10/22 2200 11/11/22 0135 11/12/22 0905  NA 135 129* 129*  --  130* 134*  K 3.3* 3.8 3.1*  --  3.2* 3.0*  CL 93* 93* 92*  --  93* 96*  CO2  --  20*  --   --  21* 26  GLUCOSE 195* 334* 477* 533* 486* 335*  BUN 40* 56* 51*  --  65* 51*  CREATININE 3.80* 4.03* 4.20*  --  4.12* 3.79*  CALCIUM  --  8.9  --   --  8.4* 8.5*  PHOS  --   --   --   --   --  5.8*    GFR: Estimated Creatinine Clearance: 15.5 mL/min (A) (by C-G formula based on SCr of 3.79 mg/dL (H)).  Liver Function Tests: Recent Labs  Lab 11/10/22 1345 11/12/22 0905  AST 30  --   ALT 19  --   ALKPHOS 201*  --   BILITOT 0.9  --   PROT  6.5  --   ALBUMIN 3.0* 2.2*    CBG: Recent Labs  Lab 11/11/22 1939 11/12/22 0734 11/12/22 1100 11/12/22 1150 11/12/22 1605  GLUCAP 158* 319* 290* 287* 105*     Recent Results (from the past 240 hour(s))  Blood Cultures x 2 sites     Status: None (Preliminary result)   Collection Time: 11/10/22 12:58 PM   Specimen: BLOOD RIGHT ARM  Result Value Ref Range Status   Specimen Description BLOOD RIGHT ARM  Final   Special Requests   Final    BOTTLES DRAWN AEROBIC AND ANAEROBIC Blood Culture results may not be optimal due to an inadequate volume of blood received in culture bottles   Culture   Final    NO GROWTH 2 DAYS Performed at [facility], [address], [place], [place] [address]    Report Status PENDING  Incomplete  Blood Cultures x 2 sites     Status: None (Preliminary result)   Collection Time: 11/10/22  4:41 PM   Specimen: BLOOD RIGHT FOREARM  Result Value Ref Range Status   Specimen Description BLOOD RIGHT FOREARM  Final   Special Requests   Final    BOTTLES DRAWN AEROBIC AND ANAEROBIC Blood Culture adequate volume   Culture   Final    NO GROWTH 2 DAYS Performed at [facility], [address] [place], [place] [address]    Report Status PENDING  Incomplete  Surgical pcr screen     Status: Abnormal   Collection Time: 11/11/22 11:31 PM   Specimen: Nasal Mucosa; Nasal Swab  Result Value Ref Range Status   MRSA, PCR NEGATIVE NEGATIVE Final   Staphylococcus aureus POSITIVE (A) NEGATIVE Final    Comment: (NOTE) The Xpert SA Assay (FDA approved for NASAL specimens in patients 56 years of age and older), is one component of a comprehensive surveillance program. It is not intended to diagnose infection nor to guide or monitor treatment. Performed at [facility], [address], [place], [place] [address]          Radiology Studies: No results found.  Scheduled Meds:  (feeding supplement) PROSource Plus  30 mL Oral BID BM    amiodarone  200 mg Oral Daily   apixaban  5 mg Oral BID   ascorbic acid  500 mg Oral Daily   atorvastatin  80 mg Oral Daily   Chlorhexidine Gluconate Cloth  6 each Topical Q0600   Chlorhexidine Gluconate Cloth  6 each Topical Daily   docusate sodium  100 mg Oral BID   feeding supplement (NEPRO CARB STEADY)  237 mL Oral Daily   folic acid  1 mg Oral Daily   insulin aspart       insulin aspart  0-5 Units Subcutaneous QHS   insulin aspart  0-6 Units Subcutaneous TID WC   insulin glargine-yfgn  30 Units Subcutaneous BID   levothyroxine  75 mcg Oral QAC breakfast   LORazepam  0.5 mg Oral QHS   melatonin  3 mg Oral QHS   midodrine  10 mg Oral Q T,Th,Sa-HD   mupirocin ointment  1 Application Nasal BID   pantoprazole  40 mg Oral BID AC   pregabalin  150 mg Oral BID   senna-docusate  2 tablet Oral BID   sevelamer carbonate  1,600 mg Oral TID WC   sodium hypochlorite   Irrigation BID   venlafaxine XR  37.5 mg Oral Q breakfast   Followed by   Melene Muller ON 11/14/2022] venlafaxine XR  75 mg Oral Q breakfast   [START ON 11/14/2022] vortioxetine HBr  10 mg Oral Daily   vortioxetine HBr  20 mg Oral q AM   zinc sulfate  220 mg Oral Daily   Continuous Infusions:  piperacillin-tazobactam (ZOSYN)  IV 2.25 g (11/12/22 1531)   vancomycin       LOS: 2 days    Time spent: 35 minutes    Ramiro Harvest, MD [facility]   To contact the attending provider between 7A-7P or the covering provider during after hours 7P-7A, please log into the web site [email or web address] and access using universal [employer] password for that web site. If you do not have the password, please call the hospital operator.  11/12/2022, 6:30 PM

## 2022-11-13 DIAGNOSIS — I952 Hypotension due to drugs: Secondary | ICD-10-CM | POA: Diagnosis not present

## 2022-11-13 DIAGNOSIS — T148XXA Other injury of unspecified body region, initial encounter: Secondary | ICD-10-CM | POA: Diagnosis not present

## 2022-11-13 DIAGNOSIS — N186 End stage renal disease: Secondary | ICD-10-CM | POA: Diagnosis not present

## 2022-11-13 DIAGNOSIS — E1142 Type 2 diabetes mellitus with diabetic polyneuropathy: Secondary | ICD-10-CM | POA: Diagnosis not present

## 2022-11-13 LAB — RENAL FUNCTION PANEL
Albumin: 2.2 g/dL — ABNORMAL LOW (ref 3.5–5.0)
Anion gap: 12 (ref 5–15)
BUN: 59 mg/dL — ABNORMAL HIGH (ref 8–23)
CO2: 25 mmol/L (ref 22–32)
Calcium: 8.5 mg/dL — ABNORMAL LOW (ref 8.9–10.3)
Chloride: 96 mmol/L — ABNORMAL LOW (ref 98–111)
Creatinine, Ser: 4.58 mg/dL — ABNORMAL HIGH (ref 0.44–1.00)
GFR, Estimated: 10 mL/min — ABNORMAL LOW (ref >60)
Glucose, Bld: 260 mg/dL — ABNORMAL HIGH (ref 70–99)
Phosphorus: 6.7 mg/dL — ABNORMAL HIGH (ref 2.5–4.6)
Potassium: 3.5 mmol/L (ref 3.5–5.1)
Sodium: 133 mmol/L — ABNORMAL LOW (ref 135–145)

## 2022-11-13 LAB — CBC WITH DIFFERENTIAL/PLATELET
Abs Immature Granulocytes: 0.02 10*3/uL (ref 0.00–0.07)
Basophils Absolute: 0 10*3/uL (ref 0.0–0.1)
Basophils Relative: 1 %
Eosinophils Absolute: 0.2 10*3/uL (ref 0.0–0.5)
Eosinophils Relative: 3 %
HCT: 30.7 % — ABNORMAL LOW (ref 36.0–46.0)
Hemoglobin: 9.5 g/dL — ABNORMAL LOW (ref 12.0–15.0)
Immature Granulocytes: 0 %
Lymphocytes Relative: 11 %
Lymphs Abs: 0.6 10*3/uL — ABNORMAL LOW (ref 0.7–4.0)
MCH: 24.7 pg — ABNORMAL LOW (ref 26.0–34.0)
MCHC: 30.9 g/dL (ref 30.0–36.0)
MCV: 79.9 fL — ABNORMAL LOW (ref 80.0–100.0)
Monocytes Absolute: 0.6 10*3/uL (ref 0.1–1.0)
Monocytes Relative: 10 %
Neutro Abs: 4.4 10*3/uL (ref 1.7–7.7)
Neutrophils Relative %: 75 %
Platelets: 133 10*3/uL — ABNORMAL LOW (ref 150–400)
RBC: 3.84 MIL/uL — ABNORMAL LOW (ref 3.87–5.11)
RDW: 16.5 % — ABNORMAL HIGH (ref 11.5–15.5)
WBC: 5.8 10*3/uL (ref 4.0–10.5)
nRBC: 0 % (ref 0.0–0.2)

## 2022-11-13 LAB — CULTURE, BLOOD (ROUTINE X 2): Culture: NO GROWTH

## 2022-11-13 LAB — GLUCOSE, CAPILLARY
Glucose-Capillary: 179 mg/dL — ABNORMAL HIGH (ref 70–99)
Glucose-Capillary: 118 mg/dL — ABNORMAL HIGH (ref 70–99)
Glucose-Capillary: 175 mg/dL — ABNORMAL HIGH (ref 70–99)
Glucose-Capillary: 191 mg/dL — ABNORMAL HIGH (ref 70–99)
Glucose-Capillary: 168 mg/dL — ABNORMAL HIGH (ref 70–99)

## 2022-11-13 MED ORDER — FLUCONAZOLE 150 MG PO TABS
150.0000 mg | ORAL_TABLET | Freq: Once | ORAL | Status: AC
  Administered 2022-11-13: 150 mg via ORAL
  Filled 2022-11-13: qty 1

## 2022-11-13 MED ORDER — CLOTRIMAZOLE 2 % VA CREA
1.0000 | TOPICAL_CREAM | Freq: Every day | VAGINAL | Status: DC
  Administered 2022-11-13 – 2022-11-16 (×3): 1 via VAGINAL
  Filled 2022-11-13: qty 21

## 2022-11-13 MED ORDER — ACETAMINOPHEN 500 MG PO TABS
1000.0000 mg | ORAL_TABLET | Freq: Three times a day (TID) | ORAL | Status: DC
  Administered 2022-11-13 – 2022-11-18 (×15): 1000 mg via ORAL
  Filled 2022-11-13 (×15): qty 2

## 2022-11-13 NOTE — Progress Notes (Signed)
[facility] Progress Note   Subjective: Seen in room  - surgery cancelled yesterday d/t po intake prior. No new complaints this am. For dialysis today.    Objective  Vitals:   11/12/22 1337 11/12/22 1959 11/13/22 0409 11/13/22 0738  BP: (!) 118/58 (!) 120/56 (!) 121/53 117/80  Pulse: 66 74 70 65  Resp: 17 18 17 16   Temp: 98.3 F (36.8 C) 98 F (36.7 C) 98.9 F (37.2 C) (!) 97.5 F (36.4 C)  TempSrc: Oral Oral  Oral  SpO2: 100% 100% 100% 100%  Weight:      Height:         Additional Objective Labs: Basic Metabolic Panel: Recent Labs  Lab 11/11/22 0135 11/12/22 0905 11/13/22 0052  NA 130* 134* 133*  K 3.2* 3.0* 3.5  CL 93* 96* 96*  CO2 21* 26 25  GLUCOSE 486* 335* 260*  BUN 65* 51* 59*  CREATININE 4.12* 3.79* 4.58*  CALCIUM 8.4* 8.5* 8.5*  PHOS  --  5.8* 6.7*    CBC: Recent Labs  Lab 11/10/22 1345 11/10/22 1649 11/11/22 0135 11/12/22 0905 11/13/22 0052  WBC 7.0  --  6.7 5.3 5.8  NEUTROABS 5.5  --   --  3.7 4.4  HGB 11.0*   < > 8.6* 9.0* 9.5*  HCT 36.5   < > 28.2* 29.8* 30.7*  MCV 80.2  --  77.5* 77.8* 79.9*  PLT 125*  --  138* 127* 133*   < > = values in this interval not displayed.    Blood Culture    Component Value Date/Time   SDES BLOOD RIGHT FOREARM 11/10/2022 1641   SPECREQUEST  11/10/2022 1641    BOTTLES DRAWN AEROBIC AND ANAEROBIC Blood Culture adequate volume   CULT  11/10/2022 1641    NO GROWTH 3 DAYS Performed at [facility], [address] [place], [place] [address]    REPTSTATUS PENDING 11/10/2022 1641     Physical Exam General: Lying in bed, nad  Heart: RRR  Lungs: Clear bilaterally  Abdomen: soft non-tender  Extremities: R BKA, LLE bandaged 1+edema  Dialysis Access: R TDC; L AVF no bruit   Medications:  piperacillin-tazobactam (ZOSYN)  IV 2.25 g (11/13/22 0610)   vancomycin      (feeding supplement) PROSource Plus  30 mL Oral BID BM   amiodarone  200 mg Oral Daily   apixaban  5 mg Oral BID    ascorbic acid  500 mg Oral Daily   atorvastatin  80 mg Oral Daily   Chlorhexidine Gluconate Cloth  6 each Topical Q0600   Chlorhexidine Gluconate Cloth  6 each Topical Daily   docusate sodium  100 mg Oral BID   feeding supplement (NEPRO CARB STEADY)  237 mL Oral Daily   folic acid  1 mg Oral Daily   insulin aspart  0-5 Units Subcutaneous QHS   insulin aspart  0-6 Units Subcutaneous TID WC   insulin glargine-yfgn  30 Units Subcutaneous BID   levothyroxine  75 mcg Oral QAC breakfast   LORazepam  0.5 mg Oral QHS   melatonin  3 mg Oral QHS   midodrine  10 mg Oral Q T,Th,Sa-HD   mupirocin ointment  1 Application Nasal BID   pantoprazole  40 mg Oral BID AC   pregabalin  150 mg Oral BID   senna-docusate  2 tablet Oral BID   sevelamer carbonate  1,600 mg Oral TID WC   sodium hypochlorite   Irrigation BID   [  START ON 11/14/2022] venlafaxine XR  75 mg Oral Q breakfast   [START ON 11/14/2022] vortioxetine HBr  10 mg Oral Daily   vortioxetine HBr  20 mg Oral q AM   zinc sulfate  220 mg Oral Daily    Dialysis Orders:  [facility] TTS 4:00 400/800 EDW 91.5kg 2K/2Ca TDC Heparin 4000 -Mircera 75  - last 6/8 -Calcitriol 0.5 three times per week     Assessment/Plan: Infected left lower extremity wound - maggot infestation on admission. Blood cultures neg to date. IV  Vanco/Zosyn per primary. Ortho following - for surgical debridement Friday ESRD -  HD TTS. Next HD 6/20. 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 For access surgery next month  Hypertension/volume  - BP controlled. Does have some volume on exam. UF as able.  Anemia  - Hgb 9.0 Next ESA due 6/22  Metabolic bone disease -  Ca/Phos ok. Continue home binders with diet.  Nutrition - Renal diet with fluid restriction.  T2DM - poorly controlled. Insulin per primary     Tomasa Blase PA-C [facility] 11/13/2022,9:17 AM

## 2022-11-13 NOTE — Progress Notes (Signed)
Pharmacy Antibiotic Note  Susan [name] is a 72 y.o. female admitted on 11/10/2022 with cellulitis. She has wound on mid left calf and has been infected for past week with pain radiating down her leg. Has history of BKA and poorly controlled DM. Pharmacy has been consulted for zosyn and vancomycin dosing. Patient is ESRD and currently on HD TTS.  WBC wnl, remains afebrile. Awaiting OR  Plan: Continue Zosyn 2.25g IV every 8 hours  Continue Vancomycin 1000mg  IV qHD (TTS) Planning surgical debridement 6/21 F/u culture data from OR to guide therapy  Height: 5\' 5"  (165.1 cm) Weight: 100 kg (220 lb 7.4 oz) IBW/kg (Calculated) : 57  Temp (24hrs), Avg:98.5 F (36.9 C), Min:98 F (36.7 C), Max:98.9 F (37.2 C)  Recent Labs  Lab 11/10/22 1345 11/10/22 1400 11/10/22 1649 11/11/22 0135 11/12/22 0905 11/13/22 0052  WBC 7.0  --   --  6.7 5.3 5.8  CREATININE 4.03*  --  4.20* 4.12* 3.79* 4.58*  LATICACIDVEN  --  1.1  --   --   --   --      Estimated Creatinine Clearance: 12.8 mL/min (A) (by C-G formula based on SCr of 4.58 mg/dL (H)).    Allergies  Allergen Reactions   Cephalexin Diarrhea and Other (See Comments)   Codeine Nausea And Vomiting and Other (See Comments)    Antimicrobials this admission: Zosyn 6/17 >>  Vancomycin 6/17 >>   Microbiology results: 6/17 BCx: ngtd  Thank you for allowing pharmacy to be a part of this patient's care.  Rexford Maus, PharmD, BCPS 11/13/2022 7:38 AM

## 2022-11-13 NOTE — TOC Progression Note (Signed)
Transition of Care [facility]) - Progression Note    Patient Details  Name: Susan [name] MRN: [id number] Date of Birth: 22-Feb-1951  [facility] CM/SW Contact  Epifanio Lesches, RN Phone Number: 11/13/2022, 11:35 AM  Clinical Narrative:       -Necrotic wound lateral left leg with maggots.  NCM spoke with pt @ bedside regarding transition of care needs. Surgery planned for tomorrow, debridement .   Pt out of SNF days. States husband has taken FMLA til September to help assist with her care. PTA active with [facility] and would like to continue their services if needed @ d/c.  TOC team will continue to monitor and assist with needs....  Expected Discharge Plan: Home w Home Health Services Barriers to Discharge: Continued Medical Work up  Expected Discharge Plan and Services   Discharge Planning Services: CM Consult   Living arrangements for the past 2 months: Single Family Home                             HH Agency: [facility]         Social Determinants of Health (SDOH) Interventions SDOH Screenings   Food Insecurity: No Food Insecurity (11/10/2022)  Housing: Low Risk  (11/10/2022)  Transportation Needs: No Transportation Needs (11/10/2022)  Utilities: Not At Risk (11/10/2022)  Alcohol Screen: Low Risk  (08/16/2021)  Depression (PHQ2-9): Low Risk  (09/29/2022)  Financial Resource Strain: Low Risk  (08/16/2021)  Physical Activity: Inactive (08/16/2021)  Social Connections: Moderately Isolated (08/16/2021)  Stress: No Stress Concern Present (08/16/2021)  Tobacco Use: Medium Risk (11/12/2022)    Readmission Risk Interventions    09/04/2022    5:11 PM 05/12/2022    5:04 PM 02/07/2022    2:57 PM  Readmission Risk Prevention Plan  Transportation Screening Complete Complete Complete  Medication Review Oceanographer) Complete  Complete  PCP or Specialist appointment within 3-5 days of discharge Complete  Complete  HRI or Home Care Consult Complete   Complete  SW Recovery Care/Counseling Consult Complete Complete Complete  Palliative Care Screening Not Applicable  Not Applicable  Skilled Nursing Facility Complete Complete Not Applicable

## 2022-11-13 NOTE — Anesthesia Preprocedure Evaluation (Addendum)
Anesthesia Evaluation  Patient identified by MRN, date of birth, ID band Patient awake    Reviewed: Allergy & Precautions, NPO status , Patient's Chart, lab work & pertinent test results  History of Anesthesia Complications (+) PONV and history of anesthetic complications  Airway Mallampati: III  TM Distance: >3 FB Neck ROM: Full    Dental no notable dental hx. (+) Teeth Intact, Dental Advisory Given   Pulmonary former smoker   Pulmonary exam normal breath sounds clear to auscultation       Cardiovascular hypertension, + CAD, + Past MI, + Peripheral Vascular Disease and +CHF  Normal cardiovascular exam+ dysrhythmias (eliquis) Atrial Fibrillation  Rhythm:Regular Rate:Normal  TTE 2024 1. Global hypokinesis with apical akinesis; overall moderate to severe LV  dysfunction.   2. Left ventricular ejection fraction, by estimation, is 30 to 35%. The  left ventricle has moderate to severely decreased function. The left  ventricle demonstrates regional wall motion abnormalities (see scoring  diagram/findings for description). There  is mild left ventricular hypertrophy. Left ventricular diastolic  parameters are indeterminate. Elevated left atrial pressure.   3. Right ventricular systolic function is mildly reduced. The right  ventricular size is moderately enlarged. There is normal pulmonary artery  systolic pressure.   4. Left atrial size was moderately dilated.   5. Right atrial size was mildly dilated.   6. Moderate pleural effusion in the left lateral region.   7. The mitral valve is normal in structure. No evidence of mitral valve  regurgitation. No evidence of mitral stenosis.   8. Tricuspid valve regurgitation is moderate.   9. The aortic valve is normal in structure. Aortic valve regurgitation is  not visualized. Aortic valve sclerosis/calcification is present, without  any evidence of aortic stenosis.  10. The inferior vena  cava is normal in size with greater than 50%  respiratory variability, suggesting right atrial pressure of 3 mmHg.     Neuro/Psych  Headaches PSYCHIATRIC DISORDERS Anxiety Depression    CVA, No Residual Symptoms    GI/Hepatic Neg liver ROS, PUD,GERD  ,,  Endo/Other  diabetes, Type 2, Insulin DependentHypothyroidism    Renal/GU ESRF and DialysisRenal disease (dialysis TTS, last dialysis Tuesday)Lab Results      Component                Value               Date                      CREATININE               6.10 (H)            11/14/2022                BUN                      89 (H)              11/14/2022                NA                       135                 11/14/2022                K  3.6                 11/14/2022                CL                       94 (L)              11/14/2022                CO2                      22                  11/14/2022             negative genitourinary   Musculoskeletal  (+) Arthritis ,    Abdominal   Peds  Hematology  (+) Blood dyscrasia, anemia Lab Results      Component                Value               Date                      WBC                      5.8                 11/14/2022                HGB                      9.0 (L)             11/14/2022                HCT                      29.3 (L)            11/14/2022                MCV                      80.5                11/14/2022                PLT                      151                 11/14/2022              Anesthesia Other Findings   Reproductive/Obstetrics                             Anesthesia Physical Anesthesia Plan  ASA: 4  Anesthesia Plan: General   Post-op Pain Management:    Induction: Intravenous  PONV Risk Score and Plan: 4 or greater and Dexamethasone, Ondansetron and Treatment may vary due to age or medical condition  Airway Management Planned: LMA  Additional Equipment:   Intra-op  Plan:   Post-operative Plan: Extubation in OR  Informed Consent: I have reviewed the patients History and Physical, chart, labs and discussed  the procedure including the risks, benefits and alternatives for the proposed anesthesia with the patient or authorized representative who has indicated his/her understanding and acceptance.     Dental advisory given  Plan Discussed with: CRNA  Anesthesia Plan Comments:        Anesthesia Quick Evaluation

## 2022-11-13 NOTE — Evaluation (Signed)
Physical Therapy Evaluation Patient Details Name: ASHWINI TA MRN: [id number] DOB: 1950-09-18 Today's Date: 11/13/2022  History of Present Illness  72yo female presenting 6/17 with maggot infestation of her LLE. h/o CAD, chronic diastolic CHF, HLD, TTS HD, HTN, PVD s/p R BKA, hypothyroidism, morbid obesity, afib, and DM.  Clinical Impression  Pt admitted with above diagnosis. Was sent home around April from SNF due to inability to cover costs. Pt reports staff did not focus on transfer or gait training after an early near-fall. She was sent home with a hoyer-lift and now requires her husband to lift her out of the recliner into a w/c daily. Mrs. Frizzell has great potential to reach a mod I level with transfers if given adequate rehab. I recall her making good progress with me during her last admission, but does not seem to have made any improvements since.  Today, she was able to sit EOB and perform lateral scoots along the bed. She showed ability to clear buttocks with attempted sit<>stand from bed but not stand upright, fully. She is in the process of getting a RLE prosthesis. Her husband is out of work on [employer], assisting pt at home until September. It has been financially difficult paying for transportation. I believe with a short period of post-acute rehab, Mrs. Petrovski could reach the ability to perform squat pivot or lateral transfers at a mod I level, to and from a wheelchair and private vehicle. This would permit the family to save the money she expresses they are spending on a financially burdensome transportation service. It would also better prepare her for greater independence once she is fitted and trained to use her RLE prosthesis. Pt currently with functional limitations due to the deficits listed below (see PT Problem List). Pt will benefit from acute skilled PT to increase their independence and safety with mobility to allow discharge.  Patient will benefit from intensive inpatient follow up  therapy, >3 hours/day.         Recommendations for follow up therapy are one component of a multi-disciplinary discharge planning process, led by the attending physician.  Recommendations may be updated based on patient status, additional functional criteria and insurance authorization.     Assistance Recommended at Discharge Frequent or constant Supervision/Assistance  Patient can return home with the following  Two people to help with walking and/or transfers;A lot of help with bathing/dressing/bathroom;Assistance with cooking/housework;Assist for transportation;Help with stairs or ramp for entrance    Equipment Recommendations None recommended by PT  Recommendations for Other Services  Rehab consult    Functional Status Assessment Patient has had a recent decline in their functional status and demonstrates the ability to make significant improvements in function in a reasonable and predictable amount of time.     Precautions / Restrictions Precautions Precautions: Fall Precaution Comments: LLE wound      Mobility  Bed Mobility Overal bed mobility: Needs Assistance Bed Mobility: Supine to Sit, Sit to Supine     Supine to sit: Min guard Sit to supine: Min assist   General bed mobility comments: Min guard for safety with rising to EOB, using rail for leverage as needed. Min assist for LLE support back into bed. Able to scoot up in bed using rail and RLE to bridge with min assist on bed pad.    Transfers Overall transfer level: Needs assistance Equipment used: None Transfers: Sit to/from Stand Sit to Stand: Total assist           General transfer  comment: Able to scoot laterally along bed with min assist, cues for LLE use to push. Attempted sit<>stand several time from bed using back of chair to lean forward. Showed good trunk control, rocking ability, and light buttock clearance with attempts but was not able to fully stand. Suspect pt would have success with Stedy  during future appointments.    Ambulation/Gait               General Gait Details: non ambulatory for quite some time.  Stairs            Wheelchair Mobility    Modified Rankin (Stroke Patients Only)       Balance Overall balance assessment: Needs assistance Sitting-balance support: No upper extremity supported, Feet supported Sitting balance-Leahy Scale: Fair                                       Pertinent Vitals/Pain Pain Assessment Pain Assessment: Faces Faces Pain Scale: Hurts even more Pain Location: LLE Pain Descriptors / Indicators: Throbbing Pain Intervention(s): Monitored during session, Repositioned, Limited activity within patient's tolerance    Home Living Family/patient expects to be discharged to:: Private residence Living Arrangements: Spouse/significant other Available Help at Discharge: Family;Available PRN/intermittently Type of Home: House Home Access: Ramped entrance       Home Layout: One level Home Equipment: Grab bars - tub/shower;Rolling Walker (2 wheels);Rollator (4 wheels);BSC/3in1;Cane - quad;Shower seat;Wheelchair - manual Additional Comments: Husband is out on FMLA until Sept. She was scheduled to get a prosthesis for RLE prior to admission    Prior Function Prior Level of Function : Needs assist             Mobility Comments: Husband uses hoyer lift to transfer patient to w/c daily. Sleeps in recliner. Uses transport service to get to MD appointments. ADLs Comments: Requires assist for ADLs bed level, pt reports self feeding, grooming.  Using brief to have BM, husband provides.     Hand Dominance   Dominant Hand: Right    Extremity/Trunk Assessment   Upper Extremity Assessment Upper Extremity Assessment: Defer to OT evaluation    Lower Extremity Assessment Lower Extremity Assessment: RLE deficits/detail;LLE deficits/detail RLE Deficits / Details: hx BKA LLE Deficits / Details: Bandaged, gross  weakness present, able to lift LE agains gravity through partial range. LLE: Unable to fully assess due to pain       Communication   Communication: No difficulties  Cognition Arousal/Alertness: Awake/alert Behavior During Therapy: WFL for tasks assessed/performed Overall Cognitive Status: Within Functional Limits for tasks assessed                                          General Comments General comments (skin integrity, edema, etc.): Discussed importance of regular mobility, floating heel, weight shifting.    Exercises General Exercises - Lower Extremity Ankle Circles/Pumps: AROM, Left, 10 reps, Supine Quad Sets: Strengthening, Both, 10 reps, Supine Gluteal Sets: Strengthening, Both, 10 reps, Supine Short Arc Quad: Strengthening, Both, 10 reps, Supine Long Arc Quad: Strengthening, Both, 10 reps, Seated Hip ABduction/ADduction: Strengthening, Both, 10 reps, Supine Straight Leg Raises: Strengthening, Both, 10 reps, Supine   Assessment/Plan    PT Assessment Patient needs continued PT services  PT Problem List Decreased strength;Decreased range of motion;Decreased activity tolerance;Decreased balance;Decreased mobility;Decreased knowledge of  use of DME;Decreased knowledge of precautions;Cardiopulmonary status limiting activity;Impaired sensation;Pain;Obesity;Decreased skin integrity       PT Treatment Interventions DME instruction;Gait training;Functional mobility training;Therapeutic activities;Therapeutic exercise;Balance training;Neuromuscular re-education;Patient/family education;Wheelchair mobility training;Modalities    PT Goals (Current goals can be found in the Care Plan section)  Acute Rehab PT Goals Patient Stated Goal: Get well, be able to walk again. PT Goal Formulation: With patient Time For Goal Achievement: 11/27/22 Potential to Achieve Goals: Good    Frequency Min 3X/week     Co-evaluation               AM-PAC PT "6 Clicks"  Mobility  Outcome Measure Help needed turning from your back to your side while in a flat bed without using bedrails?: A Little Help needed moving from lying on your back to sitting on the side of a flat bed without using bedrails?: A Little Help needed moving to and from a bed to a chair (including a wheelchair)?: A Lot Help needed standing up from a chair using your arms (e.g., wheelchair or bedside chair)?: Total Help needed to walk in hospital room?: Total Help needed climbing 3-5 steps with a railing? : Total 6 Click Score: 11    End of Session Equipment Utilized During Treatment: Gait belt Activity Tolerance: Patient tolerated treatment well Patient left: in bed;with call bell/phone within reach;with bed alarm set (Lt heel floating, LE elevated) Nurse Communication: Mobility status PT Visit Diagnosis: Muscle weakness (generalized) (M62.81);Difficulty in walking, not elsewhere classified (R26.2);Pain Pain - Right/Left: Left Pain - part of body: Leg    Time: 6045-4098 PT Time Calculation (min) (ACUTE ONLY): 42 min   Charges:   PT Evaluation $PT Eval Moderate Complexity: 1 Mod PT Treatments $Therapeutic Exercise: 8-22 mins $Therapeutic Activity: 8-22 mins        Kathlyn Sacramento, PT, DPT [facility]  Rehabilitation Services Physical Therapist Office: [phone] Website: [email or web address]   Berton Mount 11/13/2022, 3:15 PM

## 2022-11-13 NOTE — Progress Notes (Addendum)
PROGRESS NOTE    Susan [name]  ZOX:[id number] DOB: [birth year] DOA: 11/10/2022 PCP: Donita Brooks, MD    No chief complaint on file.   Brief Narrative:  72yo female with h/o CAD, chronic diastolic CHF, HLD, TTS HD, HTN, PVD s/p R BKA, hypothyroidism, morbid obesity, afib, and DM presenting with maggot infestation of her LLE. Dr. Lajoyce Corners sent her to the ER and plans for surgical debridement later this week. She was started on Vanc/Zosyn.    Assessment & Plan:   Principal Problem:   Complicated wound infection Active Problems:   Type 2 diabetes mellitus with diabetic polyneuropathy, with long-term current use of insulin (HCC)   ESRD on dialysis (HCC)   Paroxysmal atrial fibrillation (HCC)   Hyperlipemia   Hypotension   Impaired ambulation   Hypothyroidism   Prolonged QT interval   Obesity, Class III, BMI 40-49.9 (morbid obesity) (HCC)   S/P BKA (below knee amputation) unilateral, right (HCC)   Maggot infestation  #1 left lower extremity wound infection/necrotic wound lateral left leg with maggots -Patient noted to have failed conservative outpatient therapy and recommended for admission by orthopedics, Dr. Lajoyce Corners. -Patient seen in consultation by Dr. Lajoyce Corners and plan for debridement of the wound and application of Kerecis tissue graft and a cleanse choice Prevena wound VAC dressing. -Patient was to have debridement done on 11/12/2022 however procedure canceled and postponed till tomorrow 11/14/2022. -Patient seems somewhat hesitant to take pain medications at this time and as such we will place on scheduled Tylenol 1000 mg 3 times daily. -Continue empiric IV antibiotics of vancomycin and Zosyn. -Per orthopedics.  2.  ESRD on HD TTS -Patient noted with thrombosed aVF felt not salvageable per nephrology and patient for planned outpatient follow-up in 3 to 4 weeks. -Nephrology consulted and following.  3.  Diabetes mellitus type 2 -Hemoglobin A1c 7.9 (08/27/2022). -CBG 179 this  morning. -Patient was n.p.o. in anticipation of possible procedure on 11/12/2022, however procedure canceled and patient placed back on a diet. -Continue Semglee 30 units twice daily, SSI.   4.  Atrial fibrillation -Continue amiodarone for rate control. -Eliquis for anticoagulation. -May need to hold Eliquis the morning of the procedure will need to discuss with orthopedics.  5.  History of right renal infarct, splenic infarct -In the setting of underlying A-fib. -Continue Eliquis for anticoagulation. -May need to hold Eliquis the morning of surgical procedure, will need to discuss with orthopedics.  6.  Hypothyroidism -Synthroid.  7.  Hypotension -Midodrine.  8.  PVD status post right BKA -Continue statin, Eliquis.    9.  Morbid obesity -BMI 41.77 -Lifestyle modification. -Outpatient follow-up with PCP.  10.  Ambulatory dysfunction -Patient noted unable to bear weight to assist with transfers since BKA. -Being fitted for prosthesis. -PT/OT. -May be on a candidate for CIR postop.  11.  Prolonged QTc -Repeat EKG with resolution of QTc prolongation.   12.  MDD/GAD with panic  -Patient seen in consultation by psychiatry -Patient currently on  vortioxetine and been transitioned to venlafaxine per psychiatry. -Psychiatry following and appreciate their input and recommendations.  13.  Stage II pressure injury of the sacrum, POA Pressure Injury 08/27/22 Coccyx Mid Stage 2 -  Partial thickness loss of dermis presenting as a shallow open injury with a red, pink wound bed without slough. 73mmx5mmx0 red center unattached edges (Active)  08/27/22 1000  Location: Coccyx  Location Orientation: Mid  Staging: Stage 2 -  Partial thickness loss of dermis presenting as a shallow open  injury with a red, pink wound bed without slough.  Wound Description (Comments): 72mmx5mmx0 red center unattached edges  Present on Admission:   WOC RN consulted.    -   DVT prophylaxis: Eliquis Code  Status: Full Family Communication: Updated patient, no family at bedside. Disposition: TBD  Status is: Inpatient Remains inpatient appropriate because: Severity of illness   Consultants:  WOC RN: Ria Bush, RN 11/10/2022 Nephrology: Dr. Signe Colt 11/11/2022 Psychiatry: Dr. Gasper Sells 11/11/2022 Orthopedics: Dr. Lajoyce Corners 11/12/2022  Procedures:  None  Antimicrobials:  Anti-infectives (From admission, onward)    Start     Dose/Rate Route Frequency Ordered Stop   11/12/22 1145  vancomycin (VANCOCIN) IVPB 1000 mg/200 mL premix  Status:  Discontinued        1,000 mg 200 mL/hr over 60 Minutes Intravenous On call to O.R. 11/12/22 1052 11/12/22 1055   11/12/22 1145  levofloxacin (LEVAQUIN) IVPB 500 mg  Status:  Discontinued        500 mg 100 mL/hr over 60 Minutes Intravenous On call to O.R. 11/12/22 1052 11/12/22 1055   11/12/22 1054  vancomycin (VANCOCIN) 1-5 GM/200ML-% IVPB  Status:  Discontinued       Note to Pharmacy: [facility], [place]: cabinet override      11/12/22 1054 11/12/22 1114   11/12/22 0815  vancomycin (VANCOCIN) IVPB 1000 mg/200 mL premix        1,000 mg 200 mL/hr over 60 Minutes Intravenous  Once 11/12/22 0718 11/12/22 2040   11/11/22 1354  vancomycin (VANCOCIN) IVPB 1000 mg/200 mL premix        1,000 mg 200 mL/hr over 60 Minutes Intravenous Every T-Th-Sa (Hemodialysis) 11/11/22 1354     11/10/22 1700  piperacillin-tazobactam (ZOSYN) IVPB 2.25 g        2.25 g 100 mL/hr over 30 Minutes Intravenous Every 8 hours 11/10/22 1645     11/10/22 1645  vancomycin (VANCOREADY) IVPB 2000 mg/400 mL        2,000 mg 200 mL/hr over 120 Minutes Intravenous  Once 11/10/22 1632 11/11/22 1930         Subjective: Laying in bed.  Patient with complaints of left leg pain but hesitant to take any pain medications.  Denies any chest pain.  No shortness of breath.  No abdominal pain.   Objective: Vitals:   11/12/22 1337 11/12/22 1959 11/13/22 0409 11/13/22 0738  BP: (!) 118/58 (!) 120/56 (!)  121/53 117/80  Pulse: 66 74 70 65  Resp: 17 18 17 16   Temp: 98.3 F (36.8 C) 98 F (36.7 C) 98.9 F (37.2 C) (!) 97.5 F (36.4 C)  TempSrc: Oral Oral  Oral  SpO2: 100% 100% 100% 100%  Weight:      Height:        Intake/Output Summary (Last 24 hours) at 11/13/2022 1312 Last data filed at 11/12/2022 1531 Gross per 24 hour  Intake 50 ml  Output --  Net 50 ml    Filed Weights   11/11/22 1449 11/12/22 1056  Weight: 94.4 kg 100 kg    Examination:  General exam: NAD Respiratory system: CTAB.  No wheezes, no crackles, no rhonchi.  Fair air movement.  Speaking in full sentences.  Cardiovascular system: Regular rate and rhythm no murmurs rubs or gallops.  No JVD.  No lower extremity edema. Gastrointestinal system: Abdomen is nondistended, soft and nontender. No organomegaly or masses felt. Normal bowel sounds heard. Central nervous system: Alert and oriented. No focal neurological deficits. Extremities: Left lower extremity bandaged.  Status  post right BKA. Skin: No rashes, lesions or ulcers Psychiatry: Judgement and insight appear normal. Mood & affect appropriate.     Data Reviewed: I have personally reviewed following labs and imaging studies  CBC: Recent Labs  Lab 11/10/22 1345 11/10/22 1649 11/11/22 0135 11/12/22 0905 11/13/22 0052  WBC 7.0  --  6.7 5.3 5.8  NEUTROABS 5.5  --   --  3.7 4.4  HGB 11.0* 12.6 8.6* 9.0* 9.5*  HCT 36.5 37.0 28.2* 29.8* 30.7*  MCV 80.2  --  77.5* 77.8* 79.9*  PLT 125*  --  138* 127* 133*     Basic Metabolic Panel: Recent Labs  Lab 11/10/22 1345 11/10/22 1649 11/10/22 2200 11/11/22 0135 11/12/22 0905 11/13/22 0052  NA 129* 129*  --  130* 134* 133*  K 3.8 3.1*  --  3.2* 3.0* 3.5  CL 93* 92*  --  93* 96* 96*  CO2 20*  --   --  21* 26 25  GLUCOSE 334* 477* 533* 486* 335* 260*  BUN 56* 51*  --  65* 51* 59*  CREATININE 4.03* 4.20*  --  4.12* 3.79* 4.58*  CALCIUM 8.9  --   --  8.4* 8.5* 8.5*  PHOS  --   --   --   --  5.8* 6.7*      GFR: Estimated Creatinine Clearance: 12.8 mL/min (A) (by C-G formula based on SCr of 4.58 mg/dL (H)).  Liver Function Tests: Recent Labs  Lab 11/10/22 1345 11/12/22 0905 11/13/22 0052  AST 30  --   --   ALT 19  --   --   ALKPHOS 201*  --   --   BILITOT 0.9  --   --   PROT 6.5  --   --   ALBUMIN 3.0* 2.2* 2.2*     CBG: Recent Labs  Lab 11/12/22 1605 11/12/22 2000 11/13/22 0327 11/13/22 0734 11/13/22 1113  GLUCAP 105* 119* 179* 118* 175*      Recent Results (from the past 240 hour(s))  Blood Cultures x 2 sites     Status: None (Preliminary result)   Collection Time: 11/10/22 12:58 PM   Specimen: BLOOD RIGHT ARM  Result Value Ref Range Status   Specimen Description BLOOD RIGHT ARM  Final   Special Requests   Final    BOTTLES DRAWN AEROBIC AND ANAEROBIC Blood Culture results may not be optimal due to an inadequate volume of blood received in culture bottles   Culture   Final    NO GROWTH 3 DAYS Performed at [facility], [address] [place], [place] [address]    Report Status PENDING  Incomplete  Blood Cultures x 2 sites     Status: None (Preliminary result)   Collection Time: 11/10/22  4:41 PM   Specimen: BLOOD RIGHT FOREARM  Result Value Ref Range Status   Specimen Description BLOOD RIGHT FOREARM  Final   Special Requests   Final    BOTTLES DRAWN AEROBIC AND ANAEROBIC Blood Culture adequate volume   Culture   Final    NO GROWTH 3 DAYS Performed at [facility], [address], [place], [place] [address]    Report Status PENDING  Incomplete  Surgical pcr screen     Status: Abnormal   Collection Time: 11/11/22 11:31 PM   Specimen: Nasal Mucosa; Nasal Swab  Result Value Ref Range Status   MRSA, PCR NEGATIVE NEGATIVE Final   Staphylococcus aureus POSITIVE (A) NEGATIVE Final  Comment: (NOTE) The Xpert SA Assay (FDA approved for NASAL specimens in patients 32 years of age and older), is one component of a comprehensive surveillance  program. It is not intended to diagnose infection nor to guide or monitor treatment. Performed at [facility], [address] [place], [place] [address]          Radiology Studies: No results found.      Scheduled Meds:  (feeding supplement) PROSource Plus  30 mL Oral BID BM   amiodarone  200 mg Oral Daily   apixaban  5 mg Oral BID   ascorbic acid  500 mg Oral Daily   atorvastatin  80 mg Oral Daily   Chlorhexidine Gluconate Cloth  6 each Topical Q0600   Chlorhexidine Gluconate Cloth  6 each Topical Daily   docusate sodium  100 mg Oral BID   feeding supplement (NEPRO CARB STEADY)  237 mL Oral Daily   folic acid  1 mg Oral Daily   insulin aspart  0-5 Units Subcutaneous QHS   insulin aspart  0-6 Units Subcutaneous TID WC   insulin glargine-yfgn  30 Units Subcutaneous BID   levothyroxine  75 mcg Oral QAC breakfast   LORazepam  0.5 mg Oral QHS   melatonin  3 mg Oral QHS   midodrine  10 mg Oral Q T,Th,Sa-HD   mupirocin ointment  1 Application Nasal BID   pantoprazole  40 mg Oral BID AC   pregabalin  150 mg Oral BID   senna-docusate  2 tablet Oral BID   sevelamer carbonate  1,600 mg Oral TID WC   sodium hypochlorite   Irrigation BID   [START ON 11/14/2022] venlafaxine XR  75 mg Oral Q breakfast   [START ON 11/14/2022] vortioxetine HBr  10 mg Oral Daily   zinc sulfate  220 mg Oral Daily   Continuous Infusions:  piperacillin-tazobactam (ZOSYN)  IV 2.25 g (11/13/22 0610)   vancomycin       LOS: 3 days    Time spent: 35 minutes    Ramiro Harvest, MD [facility]   To contact the attending provider between 7A-7P or the covering provider during after hours 7P-7A, please log into the web site [email or web address] and access using universal [place] password for that web site. If you do not have the password, please call the hospital operator.  11/13/2022, 1:12 PM

## 2022-11-13 NOTE — Progress Notes (Signed)
Patient ID: Susan [name], female   DOB: April 22, 1951, 72 y.o.   MRN: [id number] Surgery was canceled yesterday secondary to patient's drinking a protein drink prior to surgery.  Plan for surgical debridement tomorrow Friday left calf.  Plan for dialysis today.

## 2022-11-13 NOTE — H&P (View-Only) (Signed)
Patient ID: Susan [name], female   DOB: 09/09/1950, 72 y.o.   MRN: [id number] Surgery was canceled yesterday secondary to patient's drinking a protein drink prior to surgery.  Plan for surgical debridement tomorrow Friday left calf.  Plan for dialysis today. 

## 2022-11-13 NOTE — Evaluation (Signed)
Occupational Therapy Evaluation Patient Details Name: Susan [name] MRN: [id number] DOB: April 21, 1951 Today's Date: 11/13/2022   History of Present Illness 72yo female presenting 6/17 with maggot infestation of her LLE. h/o CAD, chronic diastolic CHF, HLD, TTS HD, HTN, PVD s/p R BKA, hypothyroidism, morbid obesity, afib, and DM.   Clinical Impression   Pt seen for above diagnosis. Pt in good spirits, motivated to improve function and strength. Pt has some pain in LLE, states 8/10 pain, did not limit participation in therapy. Pt lives at home with husband who is caregiver, hoyer for transfers, husband helps with all ADLs, Pt able to complete grooming/feeding with set up. Pt has B hand/feet neuropathy, decreased functional grip, instructed on BUE exercises and given foam blocks and theraband for exercise. Pt able to perform bed mobility min A, not able to stand currently with x1 for assistance, would benefit from use of Stedy to attempt transfers in future sessions. Pt would benefit greatly from post acute rehab >3hrs/day of therapy, has potential to quickly improve function prior to DC home. Pt to be seen acutely during stay to maximize functional strength/independence.      Recommendations for follow up therapy are one component of a multi-disciplinary discharge planning process, led by the attending physician.  Recommendations may be updated based on patient status, additional functional criteria and insurance authorization.   Assistance Recommended at Discharge Frequent or constant Supervision/Assistance  Patient can return home with the following A lot of help with walking and/or transfers;A lot of help with bathing/dressing/bathroom;Assist for transportation;Help with stairs or ramp for entrance;Assistance with cooking/housework    Functional Status Assessment  Patient has had a recent decline in their functional status and demonstrates the ability to make significant improvements in function  in a reasonable and predictable amount of time.  Equipment Recommendations  None recommended by OT    Recommendations for Other Services       Precautions / Restrictions Precautions Precautions: Fall Precaution Comments: LLE wound      Mobility Bed Mobility Overal bed mobility: Needs Assistance Bed Mobility: Supine to Sit, Sit to Supine     Supine to sit: HOB elevated, Min assist Sit to supine: Min assist   General bed mobility comments: Pt required min A for supine to sit on EOB HHA and bed rails for assistance. Requires some help scooting back in bed    Transfers Overall transfer level: Needs assistance Equipment used: None Transfers: Sit to/from Stand Sit to Stand: Total assist           General transfer comment: did not attempt, Per PT total A, would recommend Stedy or x2 transfer for future attempts      Balance Overall balance assessment: Needs assistance Sitting-balance support: No upper extremity supported, Feet supported Sitting balance-Leahy Scale: Fair Sitting balance - Comments: able to balance sitting EOB performing grooming/feeding/UB dressing                                   ADL either performed or assessed with clinical judgement   ADL Overall ADL's : Needs assistance/impaired Eating/Feeding: Set up;Bed level Eating/Feeding Details (indicate cue type and reason): difficulty opening packages, drinks, hand weakness/neuropathy Grooming: Set up;Bed level   Upper Body Bathing: Moderate assistance   Lower Body Bathing: Maximal assistance   Upper Body Dressing : Minimal assistance   Lower Body Dressing: Maximal assistance   Toilet Transfer: Total assistance   Toileting-  Clothing Manipulation and Hygiene: Total assistance         General ADL Comments: Pt requires significant assistance for ADLs, able to perform some UB ADLs, hand neuropathy and weak grip limt participation     Vision         Perception     Praxis       Pertinent Vitals/Pain Pain Assessment Pain Assessment: 0-10 Pain Score: 8  Faces Pain Scale: Hurts even more Pain Location: LLE Pain Descriptors / Indicators: Throbbing Pain Intervention(s): Monitored during session     Hand Dominance Right   Extremity/Trunk Assessment Upper Extremity Assessment Upper Extremity Assessment: Generalized weakness;RUE deficits/detail;LUE deficits/detail RUE Deficits / Details: B hand neuropathy, B shoudler pain with AROM, functional ROM RUE: Shoulder pain with ROM RUE Sensation: history of peripheral neuropathy RUE Coordination: decreased fine motor LUE Deficits / Details: B hand neuropathy, B shoudler pain with AROM, functional ROM LUE: Shoulder pain with ROM LUE Sensation: history of peripheral neuropathy LUE Coordination: decreased fine motor   Lower Extremity Assessment Lower Extremity Assessment: Defer to PT evaluation       Communication Communication Communication: No difficulties   Cognition Arousal/Alertness: Awake/alert Behavior During Therapy: WFL for tasks assessed/performed Overall Cognitive Status: Within Functional Limits for tasks assessed                                       General Comments       Exercises     Shoulder Instructions      Home Living Family/patient expects to be discharged to:: Private residence Living Arrangements: Spouse/significant other Available Help at Discharge: Family;Available PRN/intermittently Type of Home: House Home Access: Ramped entrance     Home Layout: One level     Bathroom Shower/Tub: Tub/shower unit;Door   Bathroom Toilet: Handicapped height     Home Equipment: Grab bars - tub/shower;Rolling Environmental consultant (2 wheels);Rollator (4 wheels);BSC/3in1;Cane - quad;Shower seat;Wheelchair - manual   Additional Comments: Husband is out on FMLA until Sept. She was scheduled to get a prosthesis for RLE prior to admission      Prior Functioning/Environment Prior Level  of Function : Needs assist             Mobility Comments: Husband uses hoyer lift to transfer patient to w/c daily. Sleeps in recliner. Uses transport service to get to MD appointments. ADLs Comments: Requires assist for ADLs bed level, pt reports self feeding, grooming, increased difficulty due to B hand neuropathy.  Using brief to have BM, husband provides.        OT Problem List: Decreased strength;Decreased range of motion;Decreased activity tolerance;Impaired balance (sitting and/or standing);Decreased coordination;Impaired sensation;Impaired UE functional use;Pain      OT Treatment/Interventions: Self-care/ADL training;Therapeutic exercise;Neuromuscular education;Energy conservation;DME and/or AE instruction;Therapeutic activities    OT Goals(Current goals can be found in the care plan section) Acute Rehab OT Goals Patient Stated Goal: to improve overall strength and functional independence OT Goal Formulation: With patient Time For Goal Achievement: 11/27/22 Potential to Achieve Goals: Good  OT Frequency: Min 2X/week    Co-evaluation              AM-PAC OT "6 Clicks" Daily Activity     Outcome Measure Help from another person eating meals?: A Little Help from another person taking care of personal grooming?: A Little Help from another person toileting, which includes using toliet, bedpan, or urinal?: Total Help from another person  bathing (including washing, rinsing, drying)?: A Lot Help from another person to put on and taking off regular upper body clothing?: A Little Help from another person to put on and taking off regular lower body clothing?: A Lot 6 Click Score: 14   End of Session Nurse Communication: Mobility status  Activity Tolerance: Patient tolerated treatment well Patient left: in bed;with call bell/phone within reach  OT Visit Diagnosis: Unsteadiness on feet (R26.81);Other abnormalities of gait and mobility (R26.89);Muscle weakness (generalized)  (M62.81);Other symptoms and signs involving the nervous system (R29.898);Pain Pain - Right/Left: Left Pain - part of body: Leg                Time: 1610-9604 OT Time Calculation (min): 25 min Charges:  OT General Charges $OT Visit: 1 Visit OT Evaluation $OT Eval Moderate Complexity: 1 Mod OT Treatments $Self Care/Home Management : 8-22 mins  [place], OTR/L   Alexis Goodell 11/13/2022, 5:00 PM

## 2022-11-13 NOTE — Progress Notes (Signed)
Pt receives out-pt HD at [facility] on TTS. Will assist as needed.   Kalynn Declercq Renal Navigator [phone] 

## 2022-11-14 ENCOUNTER — Encounter (HOSPITAL_COMMUNITY)
Admission: EM | Disposition: A | Discharge status: Other Acute Inpatient Hospital | Payer: Self-pay | Source: Home / Self Care | Attending: Internal Medicine

## 2022-11-14 ENCOUNTER — Other Ambulatory Visit: Payer: Self-pay

## 2022-11-14 ENCOUNTER — Inpatient Hospital Stay (HOSPITAL_COMMUNITY): Payer: PPO | Admitting: Certified Registered Nurse Anesthetist

## 2022-11-14 ENCOUNTER — Encounter (HOSPITAL_COMMUNITY): Payer: Self-pay | Admitting: Internal Medicine

## 2022-11-14 DIAGNOSIS — L03116 Cellulitis of left lower limb: Secondary | ICD-10-CM | POA: Diagnosis not present

## 2022-11-14 DIAGNOSIS — T148XXA Other injury of unspecified body region, initial encounter: Secondary | ICD-10-CM | POA: Diagnosis not present

## 2022-11-14 DIAGNOSIS — Z87891 Personal history of nicotine dependence: Secondary | ICD-10-CM

## 2022-11-14 DIAGNOSIS — I132 Hypertensive heart and chronic kidney disease with heart failure and with stage 5 chronic kidney disease, or end stage renal disease: Secondary | ICD-10-CM

## 2022-11-14 DIAGNOSIS — I252 Old myocardial infarction: Secondary | ICD-10-CM | POA: Diagnosis not present

## 2022-11-14 DIAGNOSIS — N186 End stage renal disease: Secondary | ICD-10-CM

## 2022-11-14 DIAGNOSIS — E1142 Type 2 diabetes mellitus with diabetic polyneuropathy: Secondary | ICD-10-CM | POA: Diagnosis not present

## 2022-11-14 DIAGNOSIS — L089 Local infection of the skin and subcutaneous tissue, unspecified: Secondary | ICD-10-CM | POA: Diagnosis not present

## 2022-11-14 DIAGNOSIS — E782 Mixed hyperlipidemia: Secondary | ICD-10-CM | POA: Diagnosis not present

## 2022-11-14 DIAGNOSIS — I509 Heart failure, unspecified: Secondary | ICD-10-CM

## 2022-11-14 DIAGNOSIS — I2510 Atherosclerotic heart disease of native coronary artery without angina pectoris: Secondary | ICD-10-CM | POA: Diagnosis not present

## 2022-11-14 DIAGNOSIS — Z992 Dependence on renal dialysis: Secondary | ICD-10-CM

## 2022-11-14 HISTORY — PX: APPLICATION OF WOUND VAC: SHX5189

## 2022-11-14 HISTORY — PX: I & D EXTREMITY: SHX5045

## 2022-11-14 LAB — CBC WITH DIFFERENTIAL/PLATELET
Abs Immature Granulocytes: 0.04 10*3/uL (ref 0.00–0.07)
Basophils Absolute: 0.1 10*3/uL (ref 0.0–0.1)
Basophils Relative: 1 %
Eosinophils Absolute: 0.3 10*3/uL (ref 0.0–0.5)
Eosinophils Relative: 6 %
HCT: 29.3 % — ABNORMAL LOW (ref 36.0–46.0)
Hemoglobin: 9 g/dL — ABNORMAL LOW (ref 12.0–15.0)
Immature Granulocytes: 1 %
Lymphocytes Relative: 16 %
Lymphs Abs: 0.9 10*3/uL (ref 0.7–4.0)
MCH: 24.7 pg — ABNORMAL LOW (ref 26.0–34.0)
MCHC: 30.7 g/dL (ref 30.0–36.0)
MCV: 80.5 fL (ref 80.0–100.0)
Monocytes Absolute: 0.7 10*3/uL (ref 0.1–1.0)
Monocytes Relative: 11 %
Neutro Abs: 3.8 10*3/uL (ref 1.7–7.7)
Neutrophils Relative %: 65 %
Platelets: 151 10*3/uL (ref 150–400)
RBC: 3.64 MIL/uL — ABNORMAL LOW (ref 3.87–5.11)
RDW: 16.6 % — ABNORMAL HIGH (ref 11.5–15.5)
WBC: 5.8 10*3/uL (ref 4.0–10.5)
nRBC: 0 % (ref 0.0–0.2)

## 2022-11-14 LAB — RENAL FUNCTION PANEL
Albumin: 2.2 g/dL — ABNORMAL LOW (ref 3.5–5.0)
Anion gap: 19 — ABNORMAL HIGH (ref 5–15)
BUN: 89 mg/dL — ABNORMAL HIGH (ref 8–23)
CO2: 22 mmol/L (ref 22–32)
Calcium: 8.5 mg/dL — ABNORMAL LOW (ref 8.9–10.3)
Chloride: 94 mmol/L — ABNORMAL LOW (ref 98–111)
Creatinine, Ser: 6.1 mg/dL — ABNORMAL HIGH (ref 0.44–1.00)
GFR, Estimated: 7 mL/min — ABNORMAL LOW (ref >60)
Glucose, Bld: 84 mg/dL (ref 70–99)
Phosphorus: 7.8 mg/dL — ABNORMAL HIGH (ref 2.5–4.6)
Potassium: 3.6 mmol/L (ref 3.5–5.1)
Sodium: 135 mmol/L (ref 135–145)

## 2022-11-14 LAB — GLUCOSE, CAPILLARY
Glucose-Capillary: 135 mg/dL — ABNORMAL HIGH (ref 70–99)
Glucose-Capillary: 88 mg/dL (ref 70–99)
Glucose-Capillary: 106 mg/dL — ABNORMAL HIGH (ref 70–99)
Glucose-Capillary: 75 mg/dL (ref 70–99)
Glucose-Capillary: 100 mg/dL — ABNORMAL HIGH (ref 70–99)
Glucose-Capillary: 93 mg/dL (ref 70–99)
Glucose-Capillary: 146 mg/dL — ABNORMAL HIGH (ref 70–99)
Glucose-Capillary: 245 mg/dL — ABNORMAL HIGH (ref 70–99)
Glucose-Capillary: 376 mg/dL — ABNORMAL HIGH (ref 70–99)

## 2022-11-14 LAB — CULTURE, BLOOD (ROUTINE X 2): Special Requests: ADEQUATE

## 2022-11-14 LAB — AEROBIC/ANAEROBIC CULTURE W GRAM STAIN (SURGICAL/DEEP WOUND)

## 2022-11-14 SURGERY — IRRIGATION AND DEBRIDEMENT EXTREMITY
Anesthesia: General

## 2022-11-14 MED ORDER — LIDOCAINE HCL (PF) 1 % IJ SOLN: 5.0000 mL | INTRAMUSCULAR | Status: DC | PRN

## 2022-11-14 MED ORDER — HEPARIN SODIUM (PORCINE) 1000 UNIT/ML DIALYSIS: 4000.0000 [IU] | INTRAMUSCULAR | Status: DC | PRN

## 2022-11-14 MED ORDER — POLYETHYLENE GLYCOL 3350 17 G PO PACK: 17.0000 g | PACK | Freq: Every day | ORAL | Status: DC | PRN

## 2022-11-14 MED ORDER — CHLORHEXIDINE GLUCONATE 0.12 % MT SOLN
OROMUCOSAL | Status: AC
  Administered 2022-11-14: 15 mL via OROMUCOSAL
  Filled 2022-11-14: qty 15

## 2022-11-14 MED ORDER — VANCOMYCIN HCL 1000 MG IV SOLR
INTRAVENOUS | Status: AC
  Filled 2022-11-14: qty 20

## 2022-11-14 MED ORDER — HEPARIN SODIUM (PORCINE) 1000 UNIT/ML DIALYSIS
20.0000 [IU]/kg | INTRAMUSCULAR | Status: DC | PRN
  Administered 2022-11-14: 2000 [IU] via INTRAVENOUS_CENTRAL
  Filled 2022-11-14: qty 2

## 2022-11-14 MED ORDER — ALBUMIN HUMAN 25 % IV SOLN
INTRAVENOUS | Status: AC
  Administered 2022-11-14: 25 g via INTRAVENOUS
  Filled 2022-11-14: qty 100

## 2022-11-14 MED ORDER — CHLORHEXIDINE GLUCONATE 0.12 % MT SOLN: 15.0000 mL | Freq: Once | OROMUCOSAL | Status: AC

## 2022-11-14 MED ORDER — METOCLOPRAMIDE HCL 5 MG/ML IJ SOLN: 5.0000 mg | Freq: Three times a day (TID) | INTRAMUSCULAR | Status: DC | PRN

## 2022-11-14 MED ORDER — VANCOMYCIN HCL 1000 MG IV SOLR
INTRAVENOUS | Status: DC | PRN
  Administered 2022-11-14: 1000 mg via TOPICAL

## 2022-11-14 MED ORDER — ACETAMINOPHEN 500 MG PO TABS
1000.0000 mg | ORAL_TABLET | Freq: Once | ORAL | Status: AC
  Administered 2022-11-14: 1000 mg via ORAL
  Filled 2022-11-14: qty 2

## 2022-11-14 MED ORDER — SODIUM CHLORIDE 0.9 % IV SOLN: INTRAVENOUS | Status: DC

## 2022-11-14 MED ORDER — POVIDONE-IODINE 10 % EX SWAB
2.0000 | Freq: Once | CUTANEOUS | Status: AC
  Administered 2022-11-14: 2 via TOPICAL

## 2022-11-14 MED ORDER — FENTANYL CITRATE (PF) 100 MCG/2ML IJ SOLN
INTRAMUSCULAR | Status: AC
  Filled 2022-11-14: qty 2

## 2022-11-14 MED ORDER — ACETAMINOPHEN 325 MG PO TABS: 325.0000 mg | ORAL_TABLET | Freq: Four times a day (QID) | ORAL | Status: DC | PRN

## 2022-11-14 MED ORDER — PENTAFLUOROPROP-TETRAFLUOROETH EX AERO: 1.0000 | INHALATION_SPRAY | CUTANEOUS | Status: DC | PRN

## 2022-11-14 MED ORDER — CHLORHEXIDINE GLUCONATE 4 % EX SOLN: 60.0000 mL | Freq: Once | CUTANEOUS | Status: DC

## 2022-11-14 MED ORDER — DEXTROSE 50 % IV SOLN
INTRAVENOUS | Status: AC
  Filled 2022-11-14: qty 50

## 2022-11-14 MED ORDER — SORBITOL 70 % SOLN
30.0000 mL | Status: AC
  Filled 2022-11-14 (×2): qty 30

## 2022-11-14 MED ORDER — ANTICOAGULANT SODIUM CITRATE 4% (200MG/5ML) IV SOLN: 5.0000 mL | Status: DC | PRN

## 2022-11-14 MED ORDER — ALTEPLASE 2 MG IJ SOLR: 2.0000 mg | Freq: Once | INTRAMUSCULAR | Status: DC | PRN

## 2022-11-14 MED ORDER — RAMELTEON 8 MG PO TABS
8.0000 mg | ORAL_TABLET | Freq: Every day | ORAL | Status: DC
  Administered 2022-11-14 – 2022-11-18 (×5): 8 mg via ORAL
  Filled 2022-11-14 (×7): qty 1

## 2022-11-14 MED ORDER — HEPARIN SODIUM (PORCINE) 1000 UNIT/ML DIALYSIS: 1000.0000 [IU] | INTRAMUSCULAR | Status: DC | PRN

## 2022-11-14 MED ORDER — BISACODYL 10 MG RE SUPP: 10.0000 mg | Freq: Every day | RECTAL | Status: DC | PRN

## 2022-11-14 MED ORDER — 0.9 % SODIUM CHLORIDE (POUR BTL) OPTIME
TOPICAL | Status: DC | PRN
  Administered 2022-11-14: 1000 mL

## 2022-11-14 MED ORDER — METHOCARBAMOL 1000 MG/10ML IJ SOLN: 500.0000 mg | Freq: Four times a day (QID) | INTRAVENOUS | Status: DC | PRN

## 2022-11-14 MED ORDER — ONDANSETRON HCL 4 MG/2ML IJ SOLN
INTRAMUSCULAR | Status: DC | PRN
  Administered 2022-11-14: 4 mg via INTRAVENOUS

## 2022-11-14 MED ORDER — HYDROMORPHONE HCL 1 MG/ML IJ SOLN
0.5000 mg | INTRAMUSCULAR | Status: DC | PRN
  Administered 2022-11-18 – 2022-11-19 (×2): 1 mg via INTRAVENOUS
  Filled 2022-11-14 (×2): qty 1

## 2022-11-14 MED ORDER — METHOCARBAMOL 500 MG PO TABS
500.0000 mg | ORAL_TABLET | Freq: Four times a day (QID) | ORAL | Status: DC | PRN
  Administered 2022-11-14 – 2022-11-17 (×5): 500 mg via ORAL
  Filled 2022-11-14 (×5): qty 1

## 2022-11-14 MED ORDER — SODIUM CHLORIDE 0.9 % IV SOLN
2.0000 g | INTRAVENOUS | Status: DC
  Administered 2022-11-15: 2 g via INTRAVENOUS
  Filled 2022-11-14: qty 12.5

## 2022-11-14 MED ORDER — LEVOFLOXACIN IN D5W 500 MG/100ML IV SOLN
500.0000 mg | INTRAVENOUS | Status: DC
  Filled 2022-11-14: qty 100

## 2022-11-14 MED ORDER — LIDOCAINE 2% (20 MG/ML) 5 ML SYRINGE
INTRAMUSCULAR | Status: DC | PRN
  Administered 2022-11-14: 60 mg via INTRAVENOUS

## 2022-11-14 MED ORDER — METRONIDAZOLE 500 MG/100ML IV SOLN
500.0000 mg | Freq: Two times a day (BID) | INTRAVENOUS | Status: DC
  Administered 2022-11-15 – 2022-11-17 (×5): 500 mg via INTRAVENOUS
  Filled 2022-11-14 (×6): qty 100

## 2022-11-14 MED ORDER — ALBUMIN HUMAN 25 % IV SOLN: 25.0000 g | Freq: Once | INTRAVENOUS | Status: AC

## 2022-11-14 MED ORDER — METOCLOPRAMIDE HCL 5 MG PO TABS: 5.0000 mg | ORAL_TABLET | Freq: Three times a day (TID) | ORAL | Status: DC | PRN

## 2022-11-14 MED ORDER — PROPOFOL 10 MG/ML IV BOLUS
INTRAVENOUS | Status: AC
  Filled 2022-11-14: qty 20

## 2022-11-14 MED ORDER — PROPOFOL 10 MG/ML IV BOLUS
INTRAVENOUS | Status: DC | PRN
  Administered 2022-11-14: 150 mg via INTRAVENOUS

## 2022-11-14 MED ORDER — OXYCODONE HCL 5 MG PO TABS: 5.0000 mg | ORAL_TABLET | ORAL | Status: DC | PRN

## 2022-11-14 MED ORDER — OXYCODONE HCL 5 MG PO TABS
10.0000 mg | ORAL_TABLET | ORAL | Status: DC | PRN
  Administered 2022-11-14: 10 mg via ORAL
  Administered 2022-11-15: 15 mg via ORAL
  Filled 2022-11-14: qty 3
  Filled 2022-11-14 (×2): qty 2
  Filled 2022-11-14: qty 3

## 2022-11-14 MED ORDER — VANCOMYCIN HCL IN DEXTROSE 1-5 GM/200ML-% IV SOLN: 1000.0000 mg | INTRAVENOUS | Status: DC

## 2022-11-14 MED ORDER — VANCOMYCIN HCL IN DEXTROSE 1-5 GM/200ML-% IV SOLN: 1000.0000 mg | Freq: Once | INTRAVENOUS | Status: DC

## 2022-11-14 MED ORDER — HEPARIN SODIUM (PORCINE) 1000 UNIT/ML IJ SOLN
INTRAMUSCULAR | Status: AC
  Filled 2022-11-14: qty 4

## 2022-11-14 MED ORDER — FENTANYL CITRATE (PF) 250 MCG/5ML IJ SOLN
INTRAMUSCULAR | Status: DC | PRN
  Administered 2022-11-14: 25 ug via INTRAVENOUS

## 2022-11-14 MED ORDER — LIDOCAINE-PRILOCAINE 2.5-2.5 % EX CREA: 1.0000 | TOPICAL_CREAM | CUTANEOUS | Status: DC | PRN

## 2022-11-14 MED ORDER — SODIUM CHLORIDE 0.9 % IV SOLN
2.0000 g | Freq: Once | INTRAVENOUS | Status: AC
  Administered 2022-11-15: 2 g via INTRAVENOUS
  Filled 2022-11-14: qty 12.5

## 2022-11-14 MED ORDER — DEXTROSE 50 % IV SOLN
25.0000 mL | Freq: Once | INTRAVENOUS | Status: AC
  Administered 2022-11-14: 25 mL via INTRAVENOUS

## 2022-11-14 MED ORDER — PHENYLEPHRINE 80 MCG/ML (10ML) SYRINGE FOR IV PUSH (FOR BLOOD PRESSURE SUPPORT)
PREFILLED_SYRINGE | INTRAVENOUS | Status: DC | PRN
  Administered 2022-11-14: 160 ug via INTRAVENOUS
  Administered 2022-11-14: 80 ug via INTRAVENOUS
  Administered 2022-11-14: 160 ug via INTRAVENOUS
  Administered 2022-11-14: 80 ug via INTRAVENOUS
  Administered 2022-11-14 (×2): 160 ug via INTRAVENOUS

## 2022-11-14 MED ORDER — MAGNESIUM CITRATE PO SOLN: 1.0000 | Freq: Once | ORAL | Status: DC | PRN

## 2022-11-14 MED ORDER — VANCOMYCIN HCL IN DEXTROSE 1-5 GM/200ML-% IV SOLN
1000.0000 mg | Freq: Once | INTRAVENOUS | Status: AC
  Administered 2022-11-14: 1000 mg via INTRAVENOUS
  Filled 2022-11-14: qty 200

## 2022-11-14 MED ORDER — FENTANYL CITRATE (PF) 100 MCG/2ML IJ SOLN
25.0000 ug | INTRAMUSCULAR | Status: DC | PRN
  Administered 2022-11-14 (×2): 50 ug via INTRAVENOUS

## 2022-11-14 MED ORDER — MIDODRINE HCL 5 MG PO TABS
ORAL_TABLET | ORAL | Status: AC
  Filled 2022-11-14: qty 2

## 2022-11-14 MED ORDER — DOCUSATE SODIUM 100 MG PO CAPS
100.0000 mg | ORAL_CAPSULE | Freq: Two times a day (BID) | ORAL | Status: DC
  Administered 2022-11-14 – 2022-11-15 (×2): 100 mg via ORAL
  Filled 2022-11-14 (×2): qty 1

## 2022-11-14 MED ORDER — ORAL CARE MOUTH RINSE: 15.0000 mL | Freq: Once | OROMUCOSAL | Status: AC

## 2022-11-14 MED ORDER — FENTANYL CITRATE (PF) 250 MCG/5ML IJ SOLN
INTRAMUSCULAR | Status: AC
  Filled 2022-11-14: qty 5

## 2022-11-14 MED ORDER — DEXAMETHASONE SODIUM PHOSPHATE 10 MG/ML IJ SOLN
INTRAMUSCULAR | Status: DC | PRN
  Administered 2022-11-14: 4 mg via INTRAVENOUS

## 2022-11-14 SURGICAL SUPPLY — 40 items
BAG COUNTER SPONGE SURGICOUNT (BAG) IMPLANT
BAG SPNG CNTER NS LX DISP (BAG)
BLADE SURG 21 STRL SS (BLADE) ×2 IMPLANT
BNDG CMPR 5X6 CHSV STRCH STRL (GAUZE/BANDAGES/DRESSINGS)
BNDG COHESIVE 6X5 TAN ST LF (GAUZE/BANDAGES/DRESSINGS) IMPLANT
BNDG GAUZE DERMACEA FLUFF 4 (GAUZE/BANDAGES/DRESSINGS) ×4 IMPLANT
BNDG GZE DERMACEA 4 6PLY (GAUZE/BANDAGES/DRESSINGS)
CANISTER WOUND CARE 500ML ATS (WOUND CARE) IMPLANT
COVER SURGICAL LIGHT HANDLE (MISCELLANEOUS) ×4 IMPLANT
DRAPE DERMATAC (DRAPES) IMPLANT
DRAPE U-SHAPE 47X51 STRL (DRAPES) ×2 IMPLANT
DRESSING VERAFLO CLEANS CC MED (GAUZE/BANDAGES/DRESSINGS) IMPLANT
DRSG ADAPTIC 3X8 NADH LF (GAUZE/BANDAGES/DRESSINGS) ×2 IMPLANT
DRSG VERAFLO CLEANSE CC MED (GAUZE/BANDAGES/DRESSINGS) ×2
DURAPREP 26ML APPLICATOR (WOUND CARE) ×2 IMPLANT
ELECT REM PT RETURN 9FT ADLT (ELECTROSURGICAL) ×2
ELECTRODE REM PT RTRN 9FT ADLT (ELECTROSURGICAL) IMPLANT
GAUZE SPONGE 4X4 12PLY STRL (GAUZE/BANDAGES/DRESSINGS) ×2 IMPLANT
GLOVE BIOGEL PI IND STRL 9 (GLOVE) ×2 IMPLANT
GLOVE SURG ORTHO 9.0 STRL STRW (GLOVE) ×2 IMPLANT
GOWN STRL REUS W/ TWL XL LVL3 (GOWN DISPOSABLE) ×4 IMPLANT
GOWN STRL REUS W/TWL XL LVL3 (GOWN DISPOSABLE) ×4
GRAFT SKIN WND SURGICLOSE M95 (Tissue) IMPLANT
HANDPIECE INTERPULSE COAX TIP (DISPOSABLE)
KIT BASIN OR (CUSTOM PROCEDURE TRAY) ×2 IMPLANT
KIT DRSG PREVENA PLUS 7DAY 125 (MISCELLANEOUS) IMPLANT
KIT TURNOVER KIT B (KITS) ×2 IMPLANT
MANIFOLD NEPTUNE II (INSTRUMENTS) ×2 IMPLANT
NS IRRIG 1000ML POUR BTL (IV SOLUTION) ×2 IMPLANT
PACK ORTHO EXTREMITY (CUSTOM PROCEDURE TRAY) ×2 IMPLANT
PAD ARMBOARD 7.5X6 YLW CONV (MISCELLANEOUS) ×4 IMPLANT
PAD NEG PRESSURE SENSATRAC (MISCELLANEOUS) IMPLANT
SET HNDPC FAN SPRY TIP SCT (DISPOSABLE) IMPLANT
STOCKINETTE IMPERVIOUS 9X36 MD (GAUZE/BANDAGES/DRESSINGS) IMPLANT
SUT ETHILON 2 0 PSLX (SUTURE) ×2 IMPLANT
SWAB COLLECTION DEVICE MRSA (MISCELLANEOUS) ×2 IMPLANT
SWAB CULTURE ESWAB REG 1ML (MISCELLANEOUS) IMPLANT
TOWEL GREEN STERILE (TOWEL DISPOSABLE) ×2 IMPLANT
TUBE CONNECTING 12X1/4 (SUCTIONS) ×2 IMPLANT
YANKAUER SUCT BULB TIP NO VENT (SUCTIONS) ×2 IMPLANT

## 2022-11-14 NOTE — Progress Notes (Signed)
Pharmacy Antibiotic Note  Susan [name] is a 72 y.o. female admitted on 11/10/2022 with cellulitis. She has wound on mid left calf and has been infected for past week with pain radiating down her leg. Has history of BKA and poorly controlled DM. Pharmacy has been consulted for cefepime and vancomycin dosing; also on metronidazole. Patient is ESRD and on HD TTS, HD off schedule today.  Afebrile, WBC are normal, tissue culture pending.   Plan: Continue Vancomycin 1000mg  IV qHD (TTS) and dose today Cefepime 2 g IV today then 2 g after HD TTS F/u culture data from OR to guide therapy F/u HD schedule  Height: 5\' 5"  (165.1 cm) Weight: 100 kg (220 lb 7.4 oz) IBW/kg (Calculated) : 57  Temp (24hrs), Avg:98 F (36.7 C), Min:97.3 F (36.3 C), Max:98.6 F (37 C)  Recent Labs  Lab 11/10/22 1345 11/10/22 1400 11/10/22 1649 11/11/22 0135 11/12/22 0905 11/13/22 0052 11/14/22 0702  WBC 7.0  --   --  6.7 5.3 5.8 5.8  CREATININE 4.03*  --  4.20* 4.12* 3.79* 4.58* 6.10*  LATICACIDVEN  --  1.1  --   --   --   --   --      Estimated Creatinine Clearance: 9.6 mL/min (A) (by C-G formula based on SCr of 6.1 mg/dL (H)).    Allergies  Allergen Reactions   Cephalexin Diarrhea and Other (See Comments)   Codeine Nausea And Vomiting and Other (See Comments)    Antimicrobials this admission: Zosyn 6/17>>6/21 Vanc 6/17>> Cefepime 6/21>> Metronidazole 6/21>>  Microbiology results: 6/17 Bcx: ngtd 6/21 Tissue:   Thank you for involving pharmacy in this patient's care.  Loura Back, PharmD, BCPS Clinical Pharmacist Clinical phone for 11/14/2022 is [phone] 11/14/2022 12:34 PM

## 2022-11-14 NOTE — Procedures (Signed)
HD Note:  Some information was entered later than the data was gathered due to patient care needs. The stated time with the data is accurate.  Received patient in bed to unit.  Alert and oriented.  Informed consent signed and in chart.   TX duration: 4 hours  Patient BP too low for UF at start of treatment.  Susann Givens updated and new orders given.  See MAR UF was able to be turned back on once the BP recovered.  See flowsheet   Transported back to the room  Alert, without acute distress.  Hand-off given to patient's nurse.   Access used: Right upper chest HD catheter Access issues: None  Total UF removed:1900 ml, 100 ml albumin infusion and 2000 ml UF     [facility]

## 2022-11-14 NOTE — Op Note (Addendum)
11/14/2022  9:29 AM  PATIENT:  Susan [name]    PRE-OPERATIVE DIAGNOSIS: Ulceration and cellulitis of left lower extremity with maggots  POST-OPERATIVE DIAGNOSIS:  Same  PROCEDURE: Excisional DEBRIDEMENT OF LOWER EXTREMITY WOUND, with excision skin and soft tissue muscle and fascia. APPLICATION OF WOUND VAC, cleanse choice. Application Kerecis micro graft 95 cm.  SURGEON:  Nadara Mustard, MD  PHYSICIAN ASSISTANT: Shela Nevin ANESTHESIA:   General  PREOPERATIVE INDICATIONS:  Susan [name] is a  72 y.o. female with a diagnosis of cellulitis of left lower extremity who failed conservative measures and elected for surgical management.    The risks benefits and alternatives were discussed with the patient preoperatively including but not limited to the risks of infection, bleeding, nerve injury, cardiopulmonary complications, the need for revision surgery, among others, and the patient was willing to proceed.  OPERATIVE IMPLANTS:   Implant Name Type Inv. Item Serial No. Manufacturer Lot No. LRB No. Used Action  GRAFT SKIN WND SURGICLOSE M95 - [id number] Tissue GRAFT SKIN WND SURGICLOSE M95  [employer] [id number] Left 1 Implanted    @ENCIMAGES @  OPERATIVE FINDINGS: Deep tissue margins were clear.  Tissue was sent for cultures.  OPERATIVE PROCEDURE: Patient was brought the operating room and underwent a general anesthetic.  After adequate levels anesthesia obtained patient's left lower extremity was prepped using DuraPrep draped into a sterile field a timeout was called.  A 21 blade knife was used to excise skin and soft tissue muscle and fascia sharply.  This left a wound that was 6 x 6 cm and 2 cm deep.  A rondure was used to further debride back to healthy viable margins.  The wound was irrigated with normal saline.  The wound was filled with Kerecis micro graft 95 cm.  This filled a wound surface area of 72 cm.  The wound was then covered with a Prevena cleanse choice wound VAC  sponge covered with derma tack this had a good suction fit this was overwrapped with Coban patient was extubated taken the PACU in stable condition.   DISCHARGE PLANNING:  Antibiotic duration: Continue antibiotics broad-spectrum anticipate changing antibiotics based on tissue cultures.  Weightbearing: Weightbearing as tolerated on the left.  Pain medication: Opioid pathway  Dressing care/ Wound VAC: Wound VAC left lower extremity.  Ambulatory devices: Walker  Discharge to: Anticipate discharge to home.  Patient will discharge with the Praveena plus portable wound VAC pump.  Follow-up: In the office 1 week post operative.

## 2022-11-14 NOTE — Progress Notes (Signed)
  Inpatient Rehabilitation Admissions Coordinator   Met with patient at bedside for rehab assessment. No family present, OR this am.  We discussed goals and expectations of a CIR admit.  Patient recently at SNF [facility] for [facility] days. Discharged home 4/30  with spouse. Has not ambulated in 9 months. Hoyer lift used to get up. RLE prosthesis to be picked up last Monday when admitted. I will discuss with Dr Riley Kill. Please call me with any questions.   Ottie Glazier, RN, MSN Rehab Admissions Coordinator [phone]  I spoke with spouse by phone to clarify goals and expectations after Dr Riley Kill consultation today. He will continue to take FMLA and/or retire as needed to provide ongoing care. He pays privately for transport to and from OP dialysis as well as MD appointments.They are hopeful that she can have the opportunity for  CIR admit, use of her prosthesis as well as better ability to transfer to decrease the burden of care at home. I will follow up next week with her therapy progress.  Ottie Glazier, RN, MSN Rehab Admissions Coordinator [phone] 11/14/2022 3:34 PM

## 2022-11-14 NOTE — Progress Notes (Signed)
PROGRESS NOTE    Susan [name]  ZOX:[id number] DOB: [birth year] DOA: 11/10/2022 PCP: Donita Brooks, MD    No chief complaint on file.   Brief Narrative:  72yo female with h/o CAD, chronic diastolic CHF, HLD, TTS HD, HTN, PVD s/p R BKA, hypothyroidism, morbid obesity, afib, and DM presenting with maggot infestation of her LLE. Dr. Lajoyce Corners sent her to the ER and plans for surgical debridement later this week. She was started on Vanc/Zosyn.    Assessment & Plan:   Principal Problem:   Complicated wound infection Active Problems:   Type 2 diabetes mellitus with diabetic polyneuropathy, with long-term current use of insulin (HCC)   ESRD on dialysis (HCC)   Paroxysmal atrial fibrillation (HCC)   Hyperlipemia   Hypotension   Impaired ambulation   Hypothyroidism   Prolonged QT interval   Obesity, Class III, BMI 40-49.9 (morbid obesity) (HCC)   S/P BKA (below knee amputation) unilateral, right (HCC)   Maggot infestation   Cellulitis of left lower extremity  #1 left lower extremity wound infection/necrotic wound lateral left leg with maggots -Patient noted to have failed conservative outpatient therapy and recommended for admission by orthopedics, Dr. Lajoyce Corners. -Patient seen in consultation by Dr. Lajoyce Corners and plan for debridement of the wound and application of Kerecis tissue graft and a cleanse choice Prevena wound VAC dressing. -Patient was to have debridement done on 11/12/2022 however procedure canceled and postponed till tomorrow 11/14/2022. -Patient seemed somewhat hesitant to take pain medications at this time and as such patient placed on scheduled Tylenol 1000 mg 3 times daily.   -Patient status post excisional debridement of lower extremity with application of wound VAC this morning 11/14/2022, cultures sent and currently pending.   -Continue IV vancomycin.  Discontinue IV Zosyn and placed on IV cefepime and Flagyl.   -Once cultures have resulted will need ID input.  -Per  orthopedics.  2.  ESRD on HD TTS -Patient noted with thrombosed aVF felt not salvageable per nephrology and patient for planned outpatient follow-up in 3 to 4 weeks. -Nephrology consulted and following.  3.  Diabetes mellitus type 2 -Hemoglobin A1c 7.9 (08/27/2022). -CBG 93 this morning. -Continue Semglee 30 units twice daily, SSI.  4.  Atrial fibrillation -Continue amiodarone for rate control. -Eliquis held today for excisional debridement.  -Orthopedics to advise when Eliquis may be resumed.   5.  History of right renal infarct, splenic infarct -In the setting of underlying A-fib. -Eliquis held today in anticipation of excisional debridement and will defer to orthopedics when it may be resumed.    6.  Hypothyroidism -Continue Synthroid.  7.  Hypotension -Continue midodrine.  8.  PVD status post right BKA -Continue statin.  Eliquis held today for excisional debridement, will defer to orthopedics when Eliquis may be resumed.  9.  Morbid obesity -BMI 41.77 -Lifestyle modification. -Outpatient follow-up with PCP.  10.  Ambulatory dysfunction -Patient noted unable to bear weight to assist with transfers since BKA. -Being fitted for prosthesis. -PT/OT. -May be on a candidate for CIR postop.  11.  Prolonged QTc -Repeat EKG with resolution of QTc prolongation.   12.  MDD/GAD with panic  -Patient seems to have a brighter affect today and more interactive.   - Patient seen in consultation by psychiatry -Patient currently on vortioxetine and been transitioned to venlafaxine per psychiatry. -Psychiatry following and appreciate their input and recommendations.  13.  Constipation -Patient with complaints of constipation. -Sorbitol p.o. every 3 hours x 2 doses. -Change Colace  to Senokot-S twice daily.  14.  Stage II pressure injury of the sacrum, POA Pressure Injury 08/27/22 Coccyx Mid Stage 2 -  Partial thickness loss of dermis presenting as a shallow open injury with a red,  pink wound bed without slough. 23mmx5mmx0 red center unattached edges (Active)  08/27/22 1000  Location: Coccyx  Location Orientation: Mid  Staging: Stage 2 -  Partial thickness loss of dermis presenting as a shallow open injury with a red, pink wound bed without slough.  Wound Description (Comments): 3mmx5mmx0 red center unattached edges  Present on Admission:   WOC RN consulted.    -   DVT prophylaxis: No DVT prophylaxis as patient for surgery.   Code Status: Full Family Communication: Updated patient, husband at bedside. Disposition: TBD  Status is: Inpatient Remains inpatient appropriate because: Severity of illness   Consultants:  WOC RN: Ria Bush, RN 11/10/2022 Nephrology: Dr. Signe Colt 11/11/2022 Psychiatry: Dr. Gasper Sells 11/11/2022 Orthopedics: Dr. Lajoyce Corners 11/12/2022  Procedures:  Excisional debridement of lower extremity wound with excision skin and soft tissue muscle and fascia/application of wound VAC/application of Kerecis micro graft 95 cm per orthopedics: Dr. Lajoyce Corners 11/14/2022  Antimicrobials:  Anti-infectives (From admission, onward)    Start     Dose/Rate Route Frequency Ordered Stop   11/14/22 0923  vancomycin (VANCOCIN) powder  Status:  Discontinued          As needed 11/14/22 0924 11/14/22 0930   11/14/22 0745  vancomycin (VANCOCIN) IVPB 1000 mg/200 mL premix  Status:  Discontinued        1,000 mg 200 mL/hr over 60 Minutes Intravenous On call to O.R. 11/14/22 0631 11/14/22 1032   11/14/22 0730  levofloxacin (LEVAQUIN) IVPB 500 mg  Status:  Discontinued        500 mg 100 mL/hr over 60 Minutes Intravenous On call to O.R. 11/14/22 0631 11/14/22 1032   11/13/22 1730  fluconazole (DIFLUCAN) tablet 150 mg        150 mg Oral  Once 11/13/22 1630 11/13/22 1707   11/12/22 1145  vancomycin (VANCOCIN) IVPB 1000 mg/200 mL premix  Status:  Discontinued        1,000 mg 200 mL/hr over 60 Minutes Intravenous On call to O.R. 11/12/22 1052 11/12/22 1055   11/12/22 1145   levofloxacin (LEVAQUIN) IVPB 500 mg  Status:  Discontinued        500 mg 100 mL/hr over 60 Minutes Intravenous On call to O.R. 11/12/22 1052 11/12/22 1055   11/12/22 1054  vancomycin (VANCOCIN) 1-5 GM/200ML-% IVPB  Status:  Discontinued       Note to Pharmacy: [facility], [place]: cabinet override      11/12/22 1054 11/12/22 1114   11/12/22 0815  vancomycin (VANCOCIN) IVPB 1000 mg/200 mL premix        1,000 mg 200 mL/hr over 60 Minutes Intravenous  Once 11/12/22 0718 11/12/22 2040   11/11/22 1354  vancomycin (VANCOCIN) IVPB 1000 mg/200 mL premix        1,000 mg 200 mL/hr over 60 Minutes Intravenous Every T-Th-Sa (Hemodialysis) 11/11/22 1354     11/10/22 1700  piperacillin-tazobactam (ZOSYN) IVPB 2.25 g        2.25 g 100 mL/hr over 30 Minutes Intravenous Every 8 hours 11/10/22 1645     11/10/22 1645  vancomycin (VANCOREADY) IVPB 2000 mg/400 mL        2,000 mg 200 mL/hr over 120 Minutes Intravenous  Once 11/10/22 1632 11/11/22 1930         Subjective: Patient  laying in bed, just returned from the OR postop.  Seems to have a bright affect today.  Denies any chest pain or shortness of breath.  No abdominal pain.  States has not had a bowel movement in about 4 to 5 days.  Husband at bedside.  Awaiting to go to hemodialysis.   Objective: Vitals:   11/14/22 0951 11/14/22 1000 11/14/22 1015 11/14/22 1036  BP: (!) 101/53 (!) 106/50 (!) 111/55 (!) 113/48  Pulse: 61 62 62 62  Resp: 17 (!) 22 16 18   Temp:    98.5 F (36.9 C)  TempSrc:    Oral  SpO2: 92% 96% 96% 98%  Weight:      Height:        Intake/Output Summary (Last 24 hours) at 11/14/2022 1220 Last data filed at 11/14/2022 0930 Gross per 24 hour  Intake 1330 ml  Output 50 ml  Net 1280 ml    Filed Weights   11/11/22 1449 11/12/22 1056  Weight: 94.4 kg 100 kg    Examination:  General exam: NAD Respiratory system: Lungs clear to auscultation bilaterally.  No wheezes, no crackles, no rhonchi.  Fair air movement.  Speaking in  full sentences.   Cardiovascular system: RRR no murmurs rubs or gallops.  No JVD.  No lower extremity edema.  Gastrointestinal system: Abdomen is soft, nontender, nondistended, positive bowel sounds.  No rebound.  No guarding. Central nervous system: Alert and oriented. No focal neurological deficits. Extremities: Left lower extremity bandaged.  Status post right BKA. Skin: No rashes, lesions or ulcers Psychiatry: Judgement and insight appear normal. Mood & affect appropriate.     Data Reviewed: I have personally reviewed following labs and imaging studies  CBC: Recent Labs  Lab 11/10/22 1345 11/10/22 1649 11/11/22 0135 11/12/22 0905 11/13/22 0052 11/14/22 0702  WBC 7.0  --  6.7 5.3 5.8 5.8  NEUTROABS 5.5  --   --  3.7 4.4 3.8  HGB 11.0* 12.6 8.6* 9.0* 9.5* 9.0*  HCT 36.5 37.0 28.2* 29.8* 30.7* 29.3*  MCV 80.2  --  77.5* 77.8* 79.9* 80.5  PLT 125*  --  138* 127* 133* 151     Basic Metabolic Panel: Recent Labs  Lab 11/10/22 1345 11/10/22 1649 11/10/22 2200 11/11/22 0135 11/12/22 0905 11/13/22 0052 11/14/22 0702  NA 129* 129*  --  130* 134* 133* 135  K 3.8 3.1*  --  3.2* 3.0* 3.5 3.6  CL 93* 92*  --  93* 96* 96* 94*  CO2 20*  --   --  21* 26 25 22   GLUCOSE 334* 477* 533* 486* 335* 260* 84  BUN 56* 51*  --  65* 51* 59* 89*  CREATININE 4.03* 4.20*  --  4.12* 3.79* 4.58* 6.10*  CALCIUM 8.9  --   --  8.4* 8.5* 8.5* 8.5*  PHOS  --   --   --   --  5.8* 6.7* 7.8*     GFR: Estimated Creatinine Clearance: 9.6 mL/min (A) (by C-G formula based on SCr of 6.1 mg/dL (H)).  Liver Function Tests: Recent Labs  Lab 11/10/22 1345 11/12/22 0905 11/13/22 0052 11/14/22 0702  AST 30  --   --   --   ALT 19  --   --   --   ALKPHOS 201*  --   --   --   BILITOT 0.9  --   --   --   PROT 6.5  --   --   --   ALBUMIN 3.0*  2.2* 2.2* 2.2*     CBG: Recent Labs  Lab 11/14/22 0735 11/14/22 0803 11/14/22 0924 11/14/22 0936 11/14/22 1038  GLUCAP 75 106* 100* 93 146*       Recent Results (from the past 240 hour(s))  Blood Cultures x 2 sites     Status: None (Preliminary result)   Collection Time: 11/10/22 12:58 PM   Specimen: BLOOD RIGHT ARM  Result Value Ref Range Status   Specimen Description BLOOD RIGHT ARM  Final   Special Requests   Final    BOTTLES DRAWN AEROBIC AND ANAEROBIC Blood Culture results may not be optimal due to an inadequate volume of blood received in culture bottles   Culture   Final    NO GROWTH 4 DAYS Performed at [facility], [address], [place], [place] [address]    Report Status PENDING  Incomplete  Blood Cultures x 2 sites     Status: None (Preliminary result)   Collection Time: 11/10/22  4:41 PM   Specimen: BLOOD RIGHT FOREARM  Result Value Ref Range Status   Specimen Description BLOOD RIGHT FOREARM  Final   Special Requests   Final    BOTTLES DRAWN AEROBIC AND ANAEROBIC Blood Culture adequate volume   Culture   Final    NO GROWTH 4 DAYS Performed at [facility], [address] [place], [place] [address]    Report Status PENDING  Incomplete  Surgical pcr screen     Status: Abnormal   Collection Time: 11/11/22 11:31 PM   Specimen: Nasal Mucosa; Nasal Swab  Result Value Ref Range Status   MRSA, PCR NEGATIVE NEGATIVE Final   Staphylococcus aureus POSITIVE (A) NEGATIVE Final    Comment: (NOTE) The Xpert SA Assay (FDA approved for NASAL specimens in patients 36 years of age and older), is one component of a comprehensive surveillance program. It is not intended to diagnose infection nor to guide or monitor treatment. Performed at [facility], [address], [place], [place] [address]   Aerobic/Anaerobic Culture w Gram Stain (surgical/deep wound)     Status: None (Preliminary result)   Collection Time: 11/14/22  9:19 AM   Specimen: Soft Tissue, Other  Result Value Ref Range Status   Specimen Description TISSUE  Final   Special Requests NONE  Final   Gram Stain   Final    MODERATE  WBC PRESENT,BOTH PMN AND MONONUCLEAR RARE SQUAMOUS EPITHELIAL CELLS PRESENT NO ORGANISMS SEEN Performed at [facility], [address], [place], [place] [address]    Culture PENDING  Incomplete   Report Status PENDING  Incomplete         Radiology Studies: No results found.      Scheduled Meds:  (feeding supplement) PROSource Plus  30 mL Oral BID BM   acetaminophen  1,000 mg Oral TID   amiodarone  200 mg Oral Daily   ascorbic acid  500 mg Oral Daily   atorvastatin  80 mg Oral Daily   Chlorhexidine Gluconate Cloth  6 each Topical Q0600   Chlorhexidine Gluconate Cloth  6 each Topical Daily   clotrimazole  1 Applicatorful Vaginal QHS   dextrose       docusate sodium  100 mg Oral BID   feeding supplement (NEPRO CARB STEADY)  237 mL Oral Daily   fentaNYL       folic acid  1 mg Oral Daily   insulin aspart  0-5 Units Subcutaneous QHS   insulin aspart  0-6 Units Subcutaneous TID WC   insulin glargine-yfgn  30 Units Subcutaneous BID   levothyroxine  75 mcg Oral QAC breakfast   LORazepam  0.5 mg Oral QHS   melatonin  3 mg Oral QHS   midodrine  10 mg Oral Q T,Th,Sa-HD   mupirocin ointment  1 Application Nasal BID   pantoprazole  40 mg Oral BID AC   pregabalin  150 mg Oral BID   senna-docusate  2 tablet Oral BID   sevelamer carbonate  1,600 mg Oral TID WC   sodium hypochlorite   Irrigation BID   venlafaxine XR  75 mg Oral Q breakfast   vortioxetine HBr  10 mg Oral Daily   zinc sulfate  220 mg Oral Daily   Continuous Infusions:  sodium chloride     anticoagulant sodium citrate     methocarbamol (ROBAXIN) IV     piperacillin-tazobactam (ZOSYN)  IV 2.25 g (11/14/22 0530)   vancomycin       LOS: 4 days    Time spent: 35 minutes    Ramiro Harvest, MD [facility]   To contact the attending provider between 7A-7P or the covering provider during after hours 7P-7A, please log into the web site [email or web address] and access using universal [place]  password for that web site. If you do not have the password, please call the hospital operator.  11/14/2022, 12:20 PM

## 2022-11-14 NOTE — Consult Note (Signed)
[name] Consult Evaluation  Service Date: November 14, 2022 LOS:  LOS: 4 days    Primary Psychiatric Diagnoses  MDD, recurrent, severe 2.  GAD w/ panic  Assessment  Susan [name] is a 72 y.o. female admitted medically for 11/10/2022 12:29 PM for a maggot infested wound. She carries the psychiatric diagnoses of MDD and anxiety and has a past medical history fully listed below; most significant for uncontrolled diabetes, venous stasis ulcer, and BKA. Psychiatry was consulted for positive [place] by Dr. Ophelia Charter.  Her current presentation of poor sleep, low mood, feelings of worthlessness, anhedonia, low energy is most consistent with known diagnosis of MDD. She meets criteria for GAD with anxious distress based on reports of ruminative thoughts and panic attacks (does not meet avoidant criteria for panic d/o. Her recent decompensation is clearly tied to loss of independence and medical illness (causually and temporally) and, in addition to pharmacotherapy outlined below, she would benefit from aggressive physical therapy. Counseled on gun safety at time of initial consult 6/18. She consented to medication changes as below after discussion of r/b/se. I had wanted to start duloxetine but this is not generally used in people with ESRD. She was engaged with care and is treatment-focused (came here appropriately for venous stasis ulcer). She is currently appropriate for outpt management of MDD. She remained future oriented and completed a crosstaper of viortioxetine to venlafaxine without adverse effects.   6/20:  Reports her LLE pain is driving her depression and expressed wish to stop her HD sessions and transfer to hospice if her leg cannot be salvaged. Denying SI, but briefly discussed goals of care. Patient would benefit from formal palliative consult. There are no suicidal ideations. She is goal oriented and looking forward to improving mobility with PT/OT. Today is she is reporting  adequate appetite. Her sleep has been poor and agrees to try Ramelteon tonight. We also encouraged improved sleep hygiene, by dimming lights and minimizing noise at the bedtime. She is amenable to all these recommendations. She is being given colace and senokot for constipation. No HI, AVH, paranoia, or delusions.  Husband confirms patient has no access to firearms in the home as they are all locked away in a safe. They have been married for 25 years, both in their second marriage, and he has no concerns about the patient attempting suicide.   Crosstaper has been completed, denying any adverse effects to venlafaxine. She is sleeping poorly and agreed to trying Ramelteon tonight. We will sign off.     Diagnoses:  Active Hospital problems: Principal Problem:   Complicated wound infection Active Problems:   Hyperlipemia   Hypotension   Impaired ambulation   Hypothyroidism   Prolonged QT interval   Type 2 diabetes mellitus with diabetic polyneuropathy, with long-term current use of insulin (HCC)   Obesity, Class III, BMI 40-49.9 (morbid obesity) (HCC)   Paroxysmal atrial fibrillation (HCC)   S/P BKA (below knee amputation) unilateral, right (HCC)   ESRD on dialysis (HCC)   Maggot infestation   Cellulitis of left lower extremity     Plan   ## Psychiatric Medication Recommendations:  Depression, anxiety -- Crosstaper completed, today starting venlafaxine 75 mg daily  -- Discontinued melatonin and changed to Ramelteon for improved sleep  --Also encouraged patient to minimize lighting at bedtime.   ## Medical Decision Making Capacity:  Not formally assessed   ## Further Work-up:  -- none currently  -- most recent EKG on 6/18 had QtC of  532 in setting of RBBB; JTc corrects to <400  -- Pertinent labwork reviewed earlier this admission includes: generally elevated blood sugar, TSH slightly elevated in 09/2022  ## Disposition:  -- There are no current psychiatric contraindications to  discharge -- needs referrals to psych, therapy upon dc.   ## Behavioral / Environmental:  --  Utilize compassion and acknowledge the patient's experiences while setting clear and realistic expectations for care.    ## Safety and Observation Level:  - Based on my clinical evaluation, I estimate the patient to be at low risk of self harm in the current setting - At this time, we recommend a routine level of observation. This decision is based on my review of the chart including patient's history and current presentation, interview of the patient, mental status examination, and consideration of suicide risk including evaluating suicidal ideation, plan, intent, suicidal or self-harm behaviors, risk factors, and protective factors. This judgment is based on our ability to directly address suicide risk, implement suicide prevention strategies and develop a safety plan while the patient is in the clinical setting. Please contact our team if there is a concern that risk level has changed.  Lorri Frederick, MD  Psychiatric and Social History   Relevant Aspects of Hospital Course:  Admitted on 11/10/2022 for venous stasis ulcer infested with maggots  Patient Report:  Patient seen this morning, husband at bedside. Reports her LLE pain is driving her depression and expressed wish to stop her HD sessions and transfer to hospice if her leg cannot be salvaged.  There are no suicidal ideations. She is goal oriented and looking forward to improving mobility with PT/OT. Today is she is reporting adequate appetite. Her sleep has been poor and agrees to try Ramelteon tonight. We also encouraged improved sleep hygiene, by dimming lights and minimizing noise at the bedtime. She is amenable to all these recommendations. She is being given colace and senokot for constipation. No HI, AVH, paranoia, or delusions.  Husband confirms patient has no access to firearms in the home as they are all locked away in a safe.  They have been married for 25 years, both in their second marriage, and he has no concerns about the patient attempting suicide.   Psych ROS:  Depression: + difficulty sleeping, anhedonia (cut off from most activities d/t mobility), worthlessness, decreased energy. Appetite was good at home but doesn't like hospital food. + passive SI Anxiety:  episodes of panic (can't breathe, HR, sweats). Can go months without in good months - more recently a couple of times a week. Also ruminative thoughts. No real social anxiety.  Mania (lifetime and current): denies Psychosis: (lifetime and current): denies (joked about phantom limb pain here)  Collateral information:  Was not given permission to contact  Psychiatric History:  Information collected from pt, medical record  Prev Dx/Sx: Depression, anxiety Current Psych Provider: PCP Home Meds (current): trintillex 20 mg, lorazepam 0.5 mg QHS Previous Med Trials: lexapro, zoloft, prozac (briefly effective), wellbutrin (unclear efficacy).  Therapy: no   Prior Psych Hospitalization: no  Prior Self Harm: no Prior Violence: no  Family Psych History: Son attempted suicide via GSW to head, now disabled.    Social History:   Educational Hx: high school Occupational Hx: office Legal Hx: no Living Situation: with husband of 24 years, son 3 days/week Spiritual Hx: yes, but not religious Access to weapons: yes - has guns in bedroom - pt states she cannot reach  Substance History Tobacco use: quit 20+ y  ears ago Alcohol use: no Drug use: no   Exam Findings   Psychiatric Specialty Exam:  Presentation  General Appearance: Appropriate for Environment; Casual Eye Contact:Good Speech:Clear and Coherent; Normal Rate Speech Volume:Normal Handedness:Right   Mood and Affect  Mood:Dysphoric Affect:Depressed; Constricted  Thought Process  Thought Processes:Linear; Coherent; Goal Directed Descriptions of Associations:Intact Orientation:Full  (Time, Place and Person) Thought Content:Logical Hallucinations:Hallucinations: None  Ideas of Reference:None Suicidal Thoughts:Suicidal Thoughts: No  Homicidal Thoughts:Homicidal Thoughts: No   Sensorium  Memory:Immediate Fair; Recent Fair; Remote Fair Judgment:Fair Insight:Fair  Executive Functions  Concentration:Fair Attention Span:Fair Recall:Fair Fund of Knowledge:Fair Language:Fair  Psychomotor Activity  Psychomotor Activity:Psychomotor Activity: Normal   Assets  Assets:Communication Skills; Desire for Improvement  Sleep  Sleep:Sleep: Poor    Physical Exam: Vital signs:  Temp:  [97.5 F (36.4 C)-98.6 F (37 C)] 98.3 F (36.8 C) (06/21 1355) Pulse Rate:  [56-66] 66 (06/21 1355) Resp:  [12-22] 15 (06/21 1355) BP: (86-135)/(42-85) 86/85 (06/21 1355) SpO2:  [92 %-100 %] 97 % (06/21 1355) Physical Exam HENT:     Head: Normocephalic.  Eyes:     Conjunctiva/sclera: Conjunctivae normal.  Pulmonary:     Effort: Pulmonary effort is normal.  Neurological:     Mental Status: She is alert.     Blood pressure (!) 86/85, pulse 66, temperature 98.3 F (36.8 C), resp. rate 15, height 5\' 5"  (1.651 m), weight 100 kg, SpO2 97 %. Body mass index is 36.69 kg/m.   Other History   These have been pulled in through the EMR, reviewed, and updated if appropriate.   Family History:  The patient's family history includes Heart attack (age of onset: 29) in her mother.  Medical History: Past Medical History:  Diagnosis Date   Anemia    hx   Anxiety    Arthritis    "generalized" (03/15/2014)   CAD (coronary artery disease)    MI in 2000 - MI  2007 - treated bare metal stent (no nuclear since then as 9/11)   Carotid artery disease (HCC)    Chronic diastolic heart failure (HCC)    a) ECHO (08/2013) EF 55-60% and RV function nl b) RHC (08/2013) RA 4, RV 30/5/7, PA 25/10 (16), PCWP 7, Fick CO/CI 6.3/2.7, PVR 1.5 WU, PA 61 and 66%   Daily headache    "~ every other  day; since I fell in June" (03/15/2014)   Depression    Dyslipidemia    ESRD (end stage renal disease) (HCC)    Dialysis on Tues Thurs Sat   Exertional shortness of breath    History of kidney stones    HTN (hypertension)    Hypothyroidism    Obesity    Osteoarthritis    PAF (paroxysmal atrial fibrillation) (HCC)    Peripheral neuropathy    bilateral feet/hands   PONV (postoperative nausea and vomiting)    RBBB (right bundle branch block)    Old   Stroke (HCC)    mini strokes   Type II diabetes mellitus (HCC)    Type II, [name]. patient has omnipod insulin pump with Novolin R Insulin    Surgical History: Past Surgical History:  Procedure Laterality Date   A/V FISTULAGRAM Left 11/07/2022   Procedure: A/V Fistulagram;  Surgeon: Leonie Douglas, MD;  [facility]: [facility] IN[facility];  Service: Cardiovascular;  Laterality: Left;   ABDOMINAL HYSTERECTOMY  1980's   AMPUTATION Right 02/24/2018   Procedure: RIGHT FOOT GREAT TOE AND 2ND TOE AMPUTATION;  Surgeon: Nadara Mustard, MD;  Location: [facility] OR;  Service: Orthopedics;  Laterality: Right;   AMPUTATION Right 04/30/2018   Procedure: RIGHT TRANSMETATARSAL AMPUTATION;  Surgeon: Nadara Mustard, MD;  Location: [facility] OR;  Service: Orthopedics;  Laterality: Right;   AMPUTATION Right 05/02/2022   Procedure: RIGHT BELOW KNEE AMPUTATION;  Surgeon: Nadara Mustard, MD;  Location: [facility] OR;  Service: Orthopedics;  Laterality: Right;   APPLICATION OF WOUND VAC Right 06/13/2022   Procedure: APPLICATION OF WOUND VAC;  Surgeon: Nadara Mustard, MD;  Location: [facility] OR;  Service: Orthopedics;  Laterality: Right;   AV FISTULA PLACEMENT Left 04/02/2022   Procedure: LEFT ARM ARTERIOVENOUS (AV) FISTULA CREATION;  Surgeon: Nada Libman, MD;  Location: [facility] OR;  Service: Vascular;  Laterality: Left;  PERIPHERAL NERVE BLOCK   BASCILIC VEIN TRANSPOSITION Left 07/31/2022   Procedure: LEFT ARM SECOND STAGE BASILIC VEIN TRANSPOSITION;  Surgeon: Nada Libman, MD;  Location: [facility] OR;  Service: Vascular;  Laterality: Left;   BIOPSY  05/27/2020   Procedure: BIOPSY;  Surgeon: Lanelle Bal, DO;  Location: [facility]O SU[facility];  Service: Endoscopy;;   CATARACT EXTRACTION, BILATERAL Bilateral ?2013   COLONOSCOPY W/ POLYPECTOMY     COLONOSCOPY WITH PROPOFOL N/A 03/13/2019   Procedure: COLONOSCOPY WITH PROPOFOL;  Surgeon: Beverley Fiedler, MD;  Location: [facility] ENDOSCOPY;  Service: Gastroenterology;  Laterality: N/A;   CORONARY ANGIOPLASTY WITH STENT PLACEMENT  1999; 2007   "1 + 1"   ERCP N/A 02/03/2022   Procedure: ENDOSCOPIC RETROGRADE CHOLANGIOPANCREATOGRAPHY (ERCP);  Surgeon: Jeani Hawking, MD;  Location: [facility] ENDOSCOPY;  Service: Gastroenterology;  Laterality: N/A;   ESOPHAGOGASTRODUODENOSCOPY (EGD) WITH PROPOFOL N/A 03/13/2019   Procedure: ESOPHAGOGASTRODUODENOSCOPY (EGD) WITH PROPOFOL;  Surgeon: Beverley Fiedler, MD;  Location: [facility] ENDOSCOPY;  Service: Gastroenterology;  Laterality: N/A;   ESOPHAGOGASTRODUODENOSCOPY (EGD) WITH PROPOFOL N/A 05/27/2020   Procedure: ESOPHAGOGASTRODUODENOSCOPY (EGD) WITH PROPOFOL;  Surgeon: Lanelle Bal, DO;  [facility];  Service: Endoscopy;  Laterality: N/A;   ESOPHAGOGASTRODUODENOSCOPY (EGD) WITH PROPOFOL N/A 09/03/2022   Procedure: ESOPHAGOGASTRODUODENOSCOPY (EGD) WITH PROPOFOL;  Surgeon: Jeani Hawking, MD;  Location: [facility] ENDOSCOPY;  Service: Gastroenterology;  Laterality: N/A;   EYE SURGERY Bilateral    lazer   FLEXIBLE SIGMOIDOSCOPY N/A 05/23/2022   Procedure: FLEXIBLE SIGMOIDOSCOPY;  Surgeon: Jeani Hawking, MD;  Location: [facility] ENDOSCOPY;  Service: Gastroenterology;  Laterality: N/A;   FLEXIBLE SIGMOIDOSCOPY N/A 05/24/2022   Procedure: FLEXIBLE SIGMOIDOSCOPY;  Surgeon: Imogene Burn, MD;  Location: [facility] ENDOSCOPY;  Service: Gastroenterology;  Laterality: N/A;   FLEXIBLE SIGMOIDOSCOPY N/A 09/03/2022   Procedure: FLEXIBLE SIGMOIDOSCOPY;  Surgeon: Jeani Hawking, MD;  Location: [facility] ENDOSCOPY;  Service: Gastroenterology;   Laterality: N/A;   HEMOSTASIS CLIP PLACEMENT  03/13/2019   Procedure: HEMOSTASIS CLIP PLACEMENT;  Surgeon: Beverley Fiedler, MD;  Location: [facility] ENDOSCOPY;  Service: Gastroenterology;;   HEMOSTASIS CLIP PLACEMENT  05/23/2022   Procedure: HEMOSTASIS CLIP PLACEMENT;  Surgeon: Jeani Hawking, MD;  Location: [facility] ENDOSCOPY;  Service: Gastroenterology;;   HEMOSTASIS CONTROL  05/24/2022   Procedure: HEMOSTASIS CONTROL;  Surgeon: Imogene Burn, MD;  Location: [facility] ENDOSCOPY;  Service: Gastroenterology;;   HOT HEMOSTASIS N/A 05/23/2022   Procedure: HOT HEMOSTASIS (ARGON PLASMA COAGULATION/BICAP);  Surgeon: Jeani Hawking, MD;  Location: [name] ENDOSCOPY;  Service: Gastroenterology;  Laterality: N/A;   I & D EXTREMITY Left 05/05/2022   Procedure: IRRIGATION AND DEBRIDEMENT LEFT ARM AV FISTULA;  Surgeon: Cephus Shelling, MD;  Location[facility] OR;  Service: Vascular;  Laterality: Left;  INSERTION OF DIALYSIS CATHETER Right 04/02/2022   Procedure: INSERTION OF TUNNELED DIALYSIS CATHETER;  Surgeon: Nada Libman, MD;  Location: [facility] OR;  Service: Vascular;  Laterality: Right;   KNEE ARTHROSCOPY Left 10/25/2006   POLYPECTOMY  03/13/2019   Procedure: POLYPECTOMY;  Surgeon: Beverley Fiedler, MD;  Location: [facility] ENDOSCOPY;  Service: Gastroenterology;;   REMOVAL OF STONES  02/03/2022   Procedure: REMOVAL OF STONES;  Surgeon: Jeani Hawking, MD;  Location: [facility] ENDOSCOPY;  Service: Gastroenterology;;   REVISON OF ARTERIOVENOUS FISTULA Left 08/20/2022   Procedure: REVISON OF LEFT ARM ARTERIOVENOUS FISTULA;  Surgeon: Nada Libman, MD;  [facility] OR;  Service: Vascular;  Laterality: Left;   RIGHT HEART CATH N/A 07/24/2017   Procedure: RIGHT HEART CATH;  Surgeon: Dolores Patty, MD;  [facility];  Service: Cardiovascular;  Laterality: N/A;   RIGHT HEART CATHETERIZATION N/A 09/22/2013   Procedure: RIGHT HEART CATH;  Surgeon: Dolores Patty, MD;  Location: [facility] CATH LAB;  Service: Cardiovascular;  Laterality:  N/A;   SHOULDER ARTHROSCOPY WITH OPEN ROTATOR CUFF REPAIR Right 03/14/2014   Procedure: RIGHT SHOULDER ARTHROSCOPY WITH BICEPS RELEASE, OPEN SUBSCAPULA REPAIR, OPEN SUPRASPINATUS REPAIR.;  Surgeon: Cammy Copa, MD;  Location: [facility] OR;  Service: Orthopedics;  Laterality: Right;   SPHINCTEROTOMY  02/03/2022   Procedure: SPHINCTEROTOMY;  Surgeon: Jeani Hawking, MD;  Location: [facility] ENDOSCOPY;  Service: Gastroenterology;;   STUMP REVISION Right 06/13/2022   Procedure: REVISION RIGHT BELOW KNEE AMPUTATION;  Surgeon: Nadara Mustard, MD;  Location: [facility] OR;  Service: Orthopedics;  Laterality: Right;   TEE WITHOUT CARDIOVERSION N/A 02/04/2022   Procedure: TRANSESOPHAGEAL ECHOCARDIOGRAM (TEE);  Surgeon: Dolores Patty, MD;  Location: [facility] ENDOSCOPY;  Service: Cardiovascular;  Laterality: N/A;   THROMBECTOMY W/ EMBOLECTOMY Left 08/20/2022   Procedure: THROMBECTOMY OF LEFT ARM ARTERIOVENOUS FISTULA;  Surgeon: Nada Libman, MD;  Location: [facility] OR;  Service: Vascular;  Laterality: Left;   TOE AMPUTATION Right 02/24/2018   GREAT TOE AND 2ND TOE AMPUTATION   TUBAL LIGATION  1970's    Medications:   Current Facility-Administered Medications:    (feeding supplement) PROSource Plus liquid 30 mL, 30 mL, Oral, BID BM, Nadara Mustard, MD, 30 mL at 11/13/22 1523   0.9 %  sodium chloride infusion, , Intravenous, Continuous, Nadara Mustard, MD   acetaminophen (TYLENOL) tablet 650 mg, 650 mg, Oral, Q6H PRN, 650 mg at 11/12/22 0921 **OR** acetaminophen (TYLENOL) suppository 650 mg, 650 mg, Rectal, Q6H PRN, Nadara Mustard, MD   acetaminophen (TYLENOL) tablet 1,000 mg, 1,000 mg, Oral, TID, Nadara Mustard, MD, 1,000 mg at 11/14/22 1333   [START ON 11/15/2022] acetaminophen (TYLENOL) tablet 325-650 mg, 325-650 mg, Oral, Q6H PRN, Nadara Mustard, MD   alteplase (CATHFLO ACTIVASE) injection 2 mg, 2 mg, Intracatheter, Once PRN, Ejigiri, Ogechi Grace, PA-C   amiodarone (PACERONE) tablet 200 mg, 200 mg, Oral, Daily, Aldean Baker V, MD, 200 mg at 11/13/22 1053   anticoagulant sodium citrate solution 5 mL, 5 mL, Intracatheter, PRN, Ejigiri, Ogechi Grace, PA-C   ascorbic acid (VITAMIN C) tablet 500 mg, 500 mg, Oral, Daily, Nadara Mustard, MD, 500 mg at 11/13/22 1053   atorvastatin (LIPITOR) tablet 80 mg, 80 mg, Oral, Daily, Nadara Mustard, MD, 80 mg at 11/13/22 1053   bisacodyl (DULCOLAX) EC tablet 5 mg, 5 mg, Oral, Daily PRN, Nadara Mustard, MD   bisacodyl (DULCOLAX) suppository 10 mg, 10 mg, Rectal, Daily PRN, Nadara Mustard, MD  calcium carbonate (dosed in mg elemental calcium) suspension 500 mg of elemental calcium, 500 mg of elemental calcium, Oral, Q6H PRN, Nadara Mustard, MD, 500 mg of elemental calcium at 11/12/22 1929   camphor-menthol (SARNA) lotion 1 Application, 1 Application, Topical, Q8H PRN **AND** hydrOXYzine (ATARAX) tablet 25 mg, 25 mg, Oral, Q8H PRN, Nadara Mustard, MD   [START ON 11/15/2022] ceFEPIme (MAXIPIME) 2 g in sodium chloride 0.9 % 100 mL IVPB, 2 g, Intravenous, Q T,Th,Sat-1800, [place], Jennifer D, RPH   ceFEPIme (MAXIPIME) 2 g in sodium chloride 0.9 % 100 mL IVPB, 2 g, Intravenous, Once, [place], Lynita Lombard, [place]   Chlorhexidine Gluconate Cloth 2 % PADS 6 each, 6 each, Topical, Q0600, Nadara Mustard, MD, 6 each at 11/14/22 0559   Chlorhexidine Gluconate Cloth 2 % PADS 6 each, 6 each, Topical, Daily, Nadara Mustard, MD, 6 each at 11/13/22 1054   clotrimazole (GYNE-LOTRIMIN 3) 2 % vaginal cream 1 Applicatorful, 1 Applicatorful, Vaginal, QHS, Nadara Mustard, MD, 1 Applicatorful at 11/13/22 2214   dextrose 50 % solution, , , ,    docusate sodium (COLACE) capsule 100 mg, 100 mg, Oral, BID, Nadara Mustard, MD   docusate sodium (ENEMEEZ) enema 283 mg, 1 enema, Rectal, PRN, Nadara Mustard, MD   feeding supplement (NEPRO CARB STEADY) liquid 237 mL, 237 mL, Oral, Daily, Nadara Mustard, MD, 237 mL at 11/13/22 1054   fentaNYL (SUBLIMAZE) 100 MCG/2ML injection, , , ,    folic acid (FOLVITE) tablet 1 mg, 1  mg, Oral, Daily, Nadara Mustard, MD, 1 mg at 11/13/22 1053   heparin injection 2,000 Units, 20 Units/kg, Dialysis, PRN, Tomasa Blase, PA-C, 2,000 Units at 11/14/22 1402   heparin injection 4,000 Units, 4,000 Units, Dialysis, PRN, Ejigiri, Megan Mans, PA-C   hydrALAZINE (APRESOLINE) injection 5 mg, 5 mg, Intravenous, Q4H PRN, Nadara Mustard, MD   HYDROmorphone (DILAUDID) injection 0.5-1 mg, 0.5-1 mg, Intravenous, Q4H PRN, Nadara Mustard, MD   insulin aspart (novoLOG) injection 0-5 Units, 0-5 Units, Subcutaneous, QHS, Nadara Mustard, MD   insulin aspart (novoLOG) injection 0-6 Units, 0-6 Units, Subcutaneous, TID WC, Nadara Mustard, MD, 1 Units at 11/13/22 1708   insulin glargine-yfgn (SEMGLEE) injection 30 Units, 30 Units, Subcutaneous, BID, Nadara Mustard, MD, 30 Units at 11/13/22 2212   levothyroxine (SYNTHROID) tablet 75 mcg, 75 mcg, Oral, QAC breakfast, Nadara Mustard, MD, 75 mcg at 11/14/22 0528   lidocaine (PF) (XYLOCAINE) 1 % injection 5 mL, 5 mL, Intradermal, PRN, Ejigiri, Ogechi Grace, PA-C   lidocaine-prilocaine (EMLA) cream 1 Application, 1 Application, Topical, PRN, Ejigiri, Ogechi Grace, PA-C   LORazepam (ATIVAN) tablet 0.5 mg, 0.5 mg, Oral, QHS, Nadara Mustard, MD, 0.5 mg at 11/13/22 2209   magnesium citrate solution 1 Bottle, 1 Bottle, Oral, Once PRN, Nadara Mustard, MD   methocarbamol (ROBAXIN) tablet 500 mg, 500 mg, Oral, Q6H PRN **OR** methocarbamol (ROBAXIN) 500 mg in dextrose 5 % 50 mL IVPB, 500 mg, Intravenous, Q6H PRN, Nadara Mustard, MD   metoCLOPramide (REGLAN) tablet 5-10 mg, 5-10 mg, Oral, Q8H PRN **OR** metoCLOPramide (REGLAN) injection 5-10 mg, 5-10 mg, Intravenous, Q8H PRN, Nadara Mustard, MD   metroNIDAZOLE (FLAGYL) IVPB 500 mg, 500 mg, Intravenous, Q12H, Rodolph Bong, MD   midodrine (PROAMATINE) tablet 10 mg, 10 mg, Oral, Q T,Th,Sa-HD, Nadara Mustard, MD, 10 mg at 11/13/22 1100   mupirocin ointment (BACTROBAN) 2 % 1 Application, 1 Application, Nasal,  BID,  Nadara Mustard, MD, 1 Application at 11/13/22 2209   oxyCODONE (Oxy IR/ROXICODONE) immediate release tablet 10-15 mg, 10-15 mg, Oral, Q4H PRN, Nadara Mustard, MD   oxyCODONE (Oxy IR/ROXICODONE) immediate release tablet 5-10 mg, 5-10 mg, Oral, Q4H PRN, Nadara Mustard, MD   pantoprazole (PROTONIX) EC tablet 40 mg, 40 mg, Oral, BID AC, Nadara Mustard, MD, 40 mg at 11/13/22 1707   pentafluoroprop-tetrafluoroeth (GEBAUERS) aerosol 1 Application, 1 Application, Topical, PRN, Ejigiri, Megan Mans, PA-C   polyethylene glycol (MIRALAX / GLYCOLAX) packet 17 g, 17 g, Oral, Daily PRN, Nadara Mustard, MD   pregabalin (LYRICA) capsule 150 mg, 150 mg, Oral, BID, Nadara Mustard, MD, 150 mg at 11/13/22 2211   ramelteon (ROZEREM) tablet 8 mg, 8 mg, Oral, QHS, Carrion-Carrero, Annaston Upham, MD   senna-docusate (Senokot-S) tablet 2 tablet, 2 tablet, Oral, BID, Nadara Mustard, MD, 2 tablet at 11/13/22 2210   sevelamer carbonate (RENVELA) tablet 1,600 mg, 1,600 mg, Oral, TID WC, Nadara Mustard, MD, 1,600 mg at 11/13/22 1707   sodium hypochlorite (DAKIN'S 1/4 STRENGTH) topical solution, , Irrigation, BID, Nadara Mustard, MD, 1 Application at 11/13/22 1839   sorbitol 70 % solution 30 mL, 30 mL, Oral, PRN, Nadara Mustard, MD   sorbitol 70 % solution 30 mL, 30 mL, Oral, Q3H, Rodolph Bong, MD   vancomycin (VANCOCIN) IVPB 1000 mg/200 mL premix, 1,000 mg, Intravenous, Q T,Th,Sa-HD, Nadara Mustard, MD   [COMPLETED] venlafaxine XR (EFFEXOR-XR) 24 hr capsule 37.5 mg, 37.5 mg, Oral, Q breakfast, 37.5 mg at 11/13/22 0817 **FOLLOWED BY** venlafaxine XR (EFFEXOR-XR) 24 hr capsule 75 mg, 75 mg, Oral, Q breakfast, Nadara Mustard, MD   vortioxetine HBr (TRINTELLIX) tablet 10 mg, 10 mg, Oral, Daily, Nadara Mustard, MD   zinc sulfate capsule 220 mg, 220 mg, Oral, Daily, Nadara Mustard, MD, 220 mg at 11/13/22 1053   zolpidem (AMBIEN) tablet 5 mg, 5 mg, Oral, QHS PRN, Nadara Mustard, MD  Allergies: Allergies  Allergen Reactions    Cephalexin Diarrhea and Other (See Comments)   Codeine Nausea And Vomiting and Other (See Comments)

## 2022-11-14 NOTE — Anesthesia Procedure Notes (Signed)
Procedure Name: LMA Insertion Date/Time: 11/14/2022 9:11 AM  Performed by: Audie Pinto, CRNAPre-anesthesia Checklist: Patient identified, Emergency Drugs available, Suction available and Patient being monitored Patient Re-evaluated:Patient Re-evaluated prior to induction Oxygen Delivery Method: Circle system utilized Preoxygenation: Pre-oxygenation with 100% oxygen Induction Type: IV induction LMA: LMA inserted LMA Size: 4.0 Placement Confirmation: positive ETCO2 Dental Injury: Teeth and Oropharynx as per pre-operative assessment

## 2022-11-14 NOTE — Progress Notes (Signed)
[facility]  CONSULT SERVICE NOTE   Chart reviewed, discussed with admissions coordinator. 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 Pt underwent a debridement of the area with application of wound vac on 6/21 by Dr. Lajoyce Corners. Pt hasn't walked for several months at least and apparently received her right BK prosthesis recently but has yet to use. Pt has been completely sedentary, and her husband has been hoyer lifting her to the recliner each day.   For her to be a candidate for inpatient rehab, she needs to demonstrate more capacity to participate in therapy, and potential functional goals need to be made clear for patient and her husband. I would suspect something such as min to mod assist goals for transfers/wheel chair mobility and self-care might be manageable at home. If patient was able to utilize her new prosthesis somewhat for a pivot-type transfer, this might aid her cause also.   Rehab Admissions Coordinator to follow up   Ranelle Oyster, MD, [facility] Physical [facility] & Rehabilitation Medical Director Rehabilitation Services 11/14/2022

## 2022-11-14 NOTE — Transfer of Care (Signed)
Immediate Anesthesia Transfer of Care Note  Patient: Susan [name]  Procedure(s) Performed: IRRIGATION AND DEBRIDEMENT OF LOWER EXTREMITY WOUND APPLICATION OF WOUND VAC (Left)  Patient Location: PACU  Anesthesia Type:General  Level of Consciousness: drowsy and patient cooperative  Airway & Oxygen Therapy: Patient Spontanous Breathing and Patient connected to face mask oxygen  Post-op Assessment: Report given to RN and Post -op Vital signs reviewed and stable  Post vital signs: Reviewed  Last Vitals:  Vitals Value Taken Time  BP 88/42 11/14/22 0934  Temp    Pulse 57 11/14/22 0936  Resp 11 11/14/22 0936  SpO2 100 % 11/14/22 0936  Vitals shown include unvalidated device data.  Last Pain:  Vitals:   11/14/22 0749  TempSrc: Oral  PainSc: 0-No pain      Patients Stated Pain Goal: 2 (11/10/22 1836)  Complications: No notable events documented.

## 2022-11-14 NOTE — Progress Notes (Signed)
[facility] Progress Note   Subjective: Seen in room. Had debridement of leg wound in OR this am. Wound vac placed. Uncomfortable.  Did not get dialysis yesterday, not fully clear why. Will plan for today    Objective  Vitals:   11/14/22 0951 11/14/22 1000 11/14/22 1015 11/14/22 1036  BP: (!) 101/53 (!) 106/50 (!) 111/55 (!) 113/48  Pulse: 61 62 62 62  Resp: 17 (!) 22 16 18   Temp:    98.5 F (36.9 C)  TempSrc:    Oral  SpO2: 92% 96% 96% 98%  Weight:      Height:         Additional Objective Labs: Basic Metabolic Panel: Recent Labs  Lab 11/12/22 0905 11/13/22 0052 11/14/22 0702  NA 134* 133* 135  K 3.0* 3.5 3.6  CL 96* 96* 94*  CO2 26 25 22   GLUCOSE 335* 260* 84  BUN 51* 59* 89*  CREATININE 3.79* 4.58* 6.10*  CALCIUM 8.5* 8.5* 8.5*  PHOS 5.8* 6.7* 7.8*    CBC: Recent Labs  Lab 11/10/22 1345 11/10/22 1649 11/11/22 0135 11/12/22 0905 11/13/22 0052 11/14/22 0702  WBC 7.0  --  6.7 5.3 5.8 5.8  NEUTROABS 5.5  --   --  3.7 4.4 3.8  HGB 11.0*   < > 8.6* 9.0* 9.5* 9.0*  HCT 36.5   < > 28.2* 29.8* 30.7* 29.3*  MCV 80.2  --  77.5* 77.8* 79.9* 80.5  PLT 125*  --  138* 127* 133* 151   < > = values in this interval not displayed.    Blood Culture    Component Value Date/Time   SDES BLOOD RIGHT FOREARM 11/10/2022 1641   SPECREQUEST  11/10/2022 1641    BOTTLES DRAWN AEROBIC AND ANAEROBIC Blood Culture adequate volume   CULT  11/10/2022 1641    NO GROWTH 4 DAYS Performed at [facility], [address], [place], [place] [address]    REPTSTATUS PENDING 11/10/2022 1641     Physical Exam General: Lying in bed, nad  Heart: RRR  Lungs: Clear bilaterally  Abdomen: soft non-tender  Extremities: R BKA, LLE bandaged, with wound wac in place  Dialysis Access: R TDC; L AVF no bruit   Medications:  sodium chloride     anticoagulant sodium citrate     methocarbamol (ROBAXIN) IV     piperacillin-tazobactam (ZOSYN)  IV 2.25 g (11/14/22 0530)    vancomycin      (feeding supplement) PROSource Plus  30 mL Oral BID BM   acetaminophen  1,000 mg Oral TID   amiodarone  200 mg Oral Daily   ascorbic acid  500 mg Oral Daily   atorvastatin  80 mg Oral Daily   Chlorhexidine Gluconate Cloth  6 each Topical Q0600   Chlorhexidine Gluconate Cloth  6 each Topical Daily   clotrimazole  1 Applicatorful Vaginal QHS   dextrose       docusate sodium  100 mg Oral BID   feeding supplement (NEPRO CARB STEADY)  237 mL Oral Daily   fentaNYL       folic acid  1 mg Oral Daily   insulin aspart  0-5 Units Subcutaneous QHS   insulin aspart  0-6 Units Subcutaneous TID WC   insulin glargine-yfgn  30 Units Subcutaneous BID   levothyroxine  75 mcg Oral QAC breakfast   LORazepam  0.5 mg Oral QHS   melatonin  3 mg Oral QHS   midodrine  10 mg Oral Q T,Th,Sa-HD  mupirocin ointment  1 Application Nasal BID   pantoprazole  40 mg Oral BID AC   pregabalin  150 mg Oral BID   senna-docusate  2 tablet Oral BID   sevelamer carbonate  1,600 mg Oral TID WC   sodium hypochlorite   Irrigation BID   venlafaxine XR  75 mg Oral Q breakfast   vortioxetine HBr  10 mg Oral Daily   zinc sulfate  220 mg Oral Daily    Dialysis Orders:  [facility] TTS 4:00 400/800 EDW 91.5kg 2K/2Ca TDC Heparin 4000 -Mircera 75  - last 6/8 -Calcitriol 0.5 three times per week     Assessment/Plan: Infected left lower extremity wound - maggot infestation on admission. Blood cultures neg to date. IV  Vanco/Zosyn per primary. Ortho following - s/p OR debridement  ESRD -  HD TTS. HD today off schedule, back on schedule tomorrow.  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 For access surgery next month  Hypertension/volume  - BP controlled. Does have some volume on exam. UF as able.  Anemia  - Hgb 9.0 Next ESA due 6/22 -will order   Metabolic bone disease -  Ca acceptable. Phos above goal. Continue home binders with diet.  Nutrition - Renal diet with fluid restriction.  T2DM - poorly controlled. Insulin per  primary     Tomasa Blase PA-C [facility] 11/14/2022,11:15 AM

## 2022-11-14 NOTE — Interval H&P Note (Signed)
History and Physical Interval Note:  11/14/2022 6:28 AM  Susan [name]  has presented today for surgery, with the diagnosis of cellulitis of left lower extremity.  The various methods of treatment have been discussed with the patient and family. After consideration of risks, benefits and other options for treatment, the patient has consented to  Procedure(s): IRRIGATION AND DEBRIDEMENT OF LOWER EXTREMITY WOUND (N/A) as a surgical intervention.  The patient's history has been reviewed, patient examined, no change in status, stable for surgery.  I have reviewed the patient's chart and labs.  Questions were answered to the patient's satisfaction.     Nadara Mustard

## 2022-11-15 DIAGNOSIS — L03116 Cellulitis of left lower limb: Secondary | ICD-10-CM | POA: Diagnosis not present

## 2022-11-15 DIAGNOSIS — E782 Mixed hyperlipidemia: Secondary | ICD-10-CM | POA: Diagnosis not present

## 2022-11-15 DIAGNOSIS — T148XXA Other injury of unspecified body region, initial encounter: Secondary | ICD-10-CM | POA: Diagnosis not present

## 2022-11-15 DIAGNOSIS — E1142 Type 2 diabetes mellitus with diabetic polyneuropathy: Secondary | ICD-10-CM | POA: Diagnosis not present

## 2022-11-15 LAB — CBC WITH DIFFERENTIAL/PLATELET
Abs Immature Granulocytes: 0.01 10*3/uL (ref 0.00–0.07)
Basophils Absolute: 0 10*3/uL (ref 0.0–0.1)
Basophils Relative: 0 %
Eosinophils Absolute: 0 10*3/uL (ref 0.0–0.5)
Eosinophils Relative: 0 %
HCT: 26.9 % — ABNORMAL LOW (ref 36.0–46.0)
Hemoglobin: 8.3 g/dL — ABNORMAL LOW (ref 12.0–15.0)
Immature Granulocytes: 0 %
Lymphocytes Relative: 11 %
Lymphs Abs: 0.5 10*3/uL — ABNORMAL LOW (ref 0.7–4.0)
MCH: 23.9 pg — ABNORMAL LOW (ref 26.0–34.0)
MCHC: 30.9 g/dL (ref 30.0–36.0)
MCV: 77.3 fL — ABNORMAL LOW (ref 80.0–100.0)
Monocytes Absolute: 0.3 10*3/uL (ref 0.1–1.0)
Monocytes Relative: 6 %
Neutro Abs: 3.9 10*3/uL (ref 1.7–7.7)
Neutrophils Relative %: 83 %
Platelets: 157 10*3/uL (ref 150–400)
RBC: 3.48 MIL/uL — ABNORMAL LOW (ref 3.87–5.11)
RDW: 16.2 % — ABNORMAL HIGH (ref 11.5–15.5)
WBC: 4.8 10*3/uL (ref 4.0–10.5)
nRBC: 0 % (ref 0.0–0.2)

## 2022-11-15 LAB — RENAL FUNCTION PANEL
Albumin: 2.5 g/dL — ABNORMAL LOW (ref 3.5–5.0)
Anion gap: 14 (ref 5–15)
BUN: 61 mg/dL — ABNORMAL HIGH (ref 8–23)
CO2: 23 mmol/L (ref 22–32)
Calcium: 8.5 mg/dL — ABNORMAL LOW (ref 8.9–10.3)
Chloride: 94 mmol/L — ABNORMAL LOW (ref 98–111)
Creatinine, Ser: 4.33 mg/dL — ABNORMAL HIGH (ref 0.44–1.00)
GFR, Estimated: 10 mL/min — ABNORMAL LOW (ref >60)
Glucose, Bld: 461 mg/dL — ABNORMAL HIGH (ref 70–99)
Phosphorus: 5.9 mg/dL — ABNORMAL HIGH (ref 2.5–4.6)
Potassium: 3.5 mmol/L (ref 3.5–5.1)
Sodium: 131 mmol/L — ABNORMAL LOW (ref 135–145)

## 2022-11-15 LAB — GLUCOSE, CAPILLARY
Glucose-Capillary: 452 mg/dL — ABNORMAL HIGH (ref 70–99)
Glucose-Capillary: 389 mg/dL — ABNORMAL HIGH (ref 70–99)
Glucose-Capillary: 189 mg/dL — ABNORMAL HIGH (ref 70–99)
Glucose-Capillary: 267 mg/dL — ABNORMAL HIGH (ref 70–99)
Glucose-Capillary: 295 mg/dL — ABNORMAL HIGH (ref 70–99)

## 2022-11-15 LAB — AEROBIC/ANAEROBIC CULTURE W GRAM STAIN (SURGICAL/DEEP WOUND)

## 2022-11-15 LAB — CULTURE, BLOOD (ROUTINE X 2): Culture: NO GROWTH

## 2022-11-15 MED ORDER — HEPARIN SODIUM (PORCINE) 1000 UNIT/ML DIALYSIS
20.0000 [IU]/kg | INTRAMUSCULAR | Status: DC | PRN
  Filled 2022-11-15: qty 2

## 2022-11-15 MED ORDER — ALBUMIN HUMAN 25 % IV SOLN: 12.5000 g | INTRAVENOUS | Status: DC | PRN

## 2022-11-15 MED ORDER — ALBUMIN HUMAN 25 % IV SOLN
25.0000 g | INTRAVENOUS | Status: DC | PRN
  Administered 2022-11-15: 25 g via INTRAVENOUS

## 2022-11-15 MED ORDER — ALUM & MAG HYDROXIDE-SIMETH 200-200-20 MG/5ML PO SUSP
30.0000 mL | Freq: Four times a day (QID) | ORAL | Status: DC | PRN
  Administered 2022-11-19: 30 mL via ORAL
  Filled 2022-11-15 (×2): qty 30

## 2022-11-15 MED ORDER — ALBUMIN HUMAN 25 % IV SOLN
INTRAVENOUS | Status: AC
  Filled 2022-11-15: qty 100

## 2022-11-15 MED ORDER — HEPARIN SODIUM (PORCINE) 1000 UNIT/ML DIALYSIS: 1000.0000 [IU] | INTRAMUSCULAR | Status: DC | PRN

## 2022-11-15 MED ORDER — INSULIN ASPART 100 UNIT/ML IJ SOLN
6.0000 [IU] | Freq: Once | INTRAMUSCULAR | Status: AC
  Administered 2022-11-15: 6 [IU] via SUBCUTANEOUS

## 2022-11-15 MED ORDER — APIXABAN 5 MG PO TABS
5.0000 mg | ORAL_TABLET | Freq: Two times a day (BID) | ORAL | Status: DC
  Administered 2022-11-15 – 2022-11-18 (×7): 5 mg via ORAL
  Filled 2022-11-15 (×9): qty 1

## 2022-11-15 MED ORDER — HEPARIN SODIUM (PORCINE) 1000 UNIT/ML IJ SOLN
INTRAMUSCULAR | Status: AC
  Filled 2022-11-15: qty 4

## 2022-11-15 MED ORDER — VENLAFAXINE HCL ER 75 MG PO CP24
75.0000 mg | ORAL_CAPSULE | Freq: Every day | ORAL | Status: DC
  Administered 2022-11-16 – 2022-11-18 (×3): 75 mg via ORAL
  Filled 2022-11-15 (×3): qty 1

## 2022-11-15 MED ORDER — BISACODYL 10 MG RE SUPP
10.0000 mg | Freq: Once | RECTAL | Status: AC
  Administered 2022-11-15: 10 mg via RECTAL
  Filled 2022-11-15: qty 1

## 2022-11-15 MED ORDER — SORBITOL 70 % SOLN
30.0000 mL | Status: AC
  Filled 2022-11-15 (×2): qty 30

## 2022-11-15 MED ORDER — HEPARIN SODIUM (PORCINE) 1000 UNIT/ML IJ SOLN
2000.0000 [IU] | Freq: Once | INTRAMUSCULAR | Status: AC
  Administered 2022-11-15: 2000 [IU] via INTRAVENOUS

## 2022-11-15 MED ORDER — INSULIN GLARGINE-YFGN 100 UNIT/ML ~~LOC~~ SOLN
35.0000 [IU] | Freq: Two times a day (BID) | SUBCUTANEOUS | Status: DC
  Administered 2022-11-15 – 2022-11-16 (×3): 35 [IU] via SUBCUTANEOUS
  Filled 2022-11-15 (×5): qty 0.35

## 2022-11-15 NOTE — Progress Notes (Signed)
Patient off floor to dialysis

## 2022-11-15 NOTE — Procedures (Signed)
HD Note:  Some information was entered later than the data was gathered due to patient care needs. The stated time with the data is accurate.  Received patient in bed to unit.  Alert and oriented.  Informed consent signed and in chart.   TX duration: 3.5 hours  Patient experienced hypotension during parts of treatment.  Meds given, see MAR  Transported back to the room  Alert, without acute distress.  Hand-off given to patient's nurse.   Access used: Upper right chest HD catheter Access issues: none  Total UF removed: 1200 ml, 1300 UF minus 100 vanc administration    [facility]

## 2022-11-15 NOTE — Progress Notes (Signed)
[facility] Progress Note   Subjective: Seen on dialysis.  Doing OK. L leg feeling better.    Objective  Vitals:   11/15/22 0244 11/15/22 0800 11/15/22 0858 11/15/22 0911  BP: (!) 133/53 (!) 98/39 (!) 113/58 (!) 102/44  Pulse: 71 62 (!) 59 60  Resp: 20 16 (!) 9 11  Temp: 97.6 F (36.4 C) (!) 97.5 F (36.4 C)  97.7 F (36.5 C)  TempSrc: Oral Oral    SpO2: 95% 100% 96% 96%  Weight:   100 kg   Height:         Additional Objective Labs: Basic Metabolic Panel: Recent Labs  Lab 11/13/22 0052 11/14/22 0702 11/15/22 0415  NA 133* 135 131*  K 3.5 3.6 3.5  CL 96* 94* 94*  CO2 25 22 23   GLUCOSE 260* 84 461*  BUN 59* 89* 61*  CREATININE 4.58* 6.10* 4.33*  CALCIUM 8.5* 8.5* 8.5*  PHOS 6.7* 7.8* 5.9*   CBC: Recent Labs  Lab 11/11/22 0135 11/12/22 0905 11/13/22 0052 11/14/22 0702 11/15/22 0415  WBC 6.7 5.3 5.8 5.8 4.8  NEUTROABS  --  3.7 4.4 3.8 3.9  HGB 8.6* 9.0* 9.5* 9.0* 8.3*  HCT 28.2* 29.8* 30.7* 29.3* 26.9*  MCV 77.5* 77.8* 79.9* 80.5 77.3*  PLT 138* 127* 133* 151 157   Blood Culture    Component Value Date/Time   SDES TISSUE 11/14/2022 0919   SPECREQUEST NONE 11/14/2022 0919   CULT PENDING 11/14/2022 0919   REPTSTATUS PENDING 11/14/2022 0919     Physical Exam General: Lying in bed, nad  Heart: RRR  Lungs: Clear bilaterally  Abdomen: soft non-tender  Extremities: R BKA, LLE bandaged, with wound wac in place  Dialysis Access: R TDC; L AVF no bruit   Medications:  sodium chloride     albumin human     ceFEPime (MAXIPIME) IV     ceFEPime (MAXIPIME) IV     methocarbamol (ROBAXIN) IV     metronidazole 500 mg (11/15/22 0201)   vancomycin      (feeding supplement) PROSource Plus  30 mL Oral BID BM   acetaminophen  1,000 mg Oral TID   amiodarone  200 mg Oral Daily   apixaban  5 mg Oral BID   ascorbic acid  500 mg Oral Daily   atorvastatin  80 mg Oral Daily   Chlorhexidine Gluconate Cloth  6 each Topical Q0600   Chlorhexidine  Gluconate Cloth  6 each Topical Daily   clotrimazole  1 Applicatorful Vaginal QHS   docusate sodium  100 mg Oral BID   feeding supplement (NEPRO CARB STEADY)  237 mL Oral Daily   folic acid  1 mg Oral Daily   insulin aspart  0-5 Units Subcutaneous QHS   insulin aspart  0-6 Units Subcutaneous TID WC   insulin glargine-yfgn  35 Units Subcutaneous BID   levothyroxine  75 mcg Oral QAC breakfast   LORazepam  0.5 mg Oral QHS   midodrine  10 mg Oral Q T,Th,Sa-HD   mupirocin ointment  1 Application Nasal BID   pantoprazole  40 mg Oral BID AC   pregabalin  150 mg Oral BID   ramelteon  8 mg Oral QHS   senna-docusate  2 tablet Oral BID   sevelamer carbonate  1,600 mg Oral TID WC   sodium hypochlorite   Irrigation BID   venlafaxine XR  75 mg Oral Q breakfast   vortioxetine HBr  10 mg Oral Daily   zinc sulfate  220  mg Oral Daily    Dialysis Orders:  [facility] TTS 4:00 400/800 EDW 91.5kg 2K/2Ca TDC Heparin 4000 -Mircera 75  - last 6/8 -Calcitriol 0.5 three times per week     Assessment/Plan: Infected left lower extremity wound - maggot infestation on admission. Blood cultures neg to date. IV  Vanco/Zosyn per primary. Ortho following - s/p OR debridement, now with wound vac ESRD -  HD TTS. HD today back on schedule Thrombosed AVF - not salvageable. For access surgery next month  Hypertension/volume  - BP controlled. Does have some volume on exam. UF as able.  Anemia  - Hgb 9.0 Next ESA due 6/22 -will order   Metabolic bone disease -  Ca acceptable. Phos above goal. Continue home binders with diet.  Nutrition - Renal diet with fluid restriction.  T2DM - poorly controlled. Insulin per primary     Bufford Buttner MD [facility] 11/15/2022,9:27 AM

## 2022-11-15 NOTE — Procedures (Signed)
Patient seen and examined on Hemodialysis. The procedure was supervised and I have made appropriate changes. BP (!) 102/44   Pulse 60   Temp 97.7 F (36.5 C)   Resp 11   Ht 5\' 5"  (1.651 m)   Wt 100 kg   SpO2 96%   BMI 36.69 kg/m   QB 400 mL/ min via AVF, UF goal 3L  Tolerating treatment without complaints at this time.   Bufford Buttner MD [facility] [phone] 9:29 AM

## 2022-11-15 NOTE — Progress Notes (Signed)
PROGRESS NOTE    Susan [name]  NWG:[id number] DOB: Aug 20, 1950 DOA: 11/10/2022 PCP: Donita Brooks, MD    No chief complaint on file.   Brief Narrative:  72yo female with h/o CAD, chronic diastolic CHF, HLD, TTS HD, HTN, PVD s/p R BKA, hypothyroidism, morbid obesity, afib, and DM presenting with maggot infestation of her LLE. Dr. Lajoyce Corners sent her to the ER and plans for surgical debridement later this week. She was started on Vanc/Zosyn.    Assessment & Plan:   Principal Problem:   Complicated wound infection Active Problems:   Type 2 diabetes mellitus with diabetic polyneuropathy, with long-term current use of insulin (HCC)   ESRD on dialysis (HCC)   Paroxysmal atrial fibrillation (HCC)   Hyperlipemia   Hypotension   Impaired ambulation   Hypothyroidism   Prolonged QT interval   Obesity, Class III, BMI 40-49.9 (morbid obesity) (HCC)   S/P BKA (below knee amputation) unilateral, right (HCC)   Maggot infestation   Cellulitis of left lower extremity  #1 left lower extremity wound infection/necrotic wound lateral left leg with maggots -Patient noted to have failed conservative outpatient therapy and recommended for admission by orthopedics, Dr. Lajoyce Corners. -Patient seen in consultation by Dr. Lajoyce Corners and plan for debridement of the wound and application of Kerecis tissue graft and a cleanse choice Prevena wound VAC dressing. -Patient was to have debridement done on 11/12/2022 however procedure canceled and postponed till, 11/14/2022. -Patient seemed somewhat hesitant to take pain medications at this time and as such patient placed on scheduled Tylenol 1000 mg 3 times daily.   -Patient status post excisional debridement of lower extremity with application of wound VAC this morning 11/14/2022, cultures sent and currently pending.   -Continue IV vancomycin, IV cefepime, IV Flagyl. -Once cultures have resulted will need ID input.  -Per orthopedics.  2.  ESRD on HD TTS -Patient noted with  thrombosed aVF felt not salvageable per nephrology and patient for planned outpatient follow-up in 3 to 4 weeks. -In hemodialysis. -Nephrology consulted and following.  3.  Diabetes mellitus type 2 -Hemoglobin A1c 7.9 (08/27/2022). -CBG 389 this morning. -Increase Semglee to 35 units twice daily.  SSI.   4.  Atrial fibrillation -Continue amiodarone for rate control. -Eliquis was held for excisional debridement and will be resumed today.    5.  History of right renal infarct, splenic infarct -In the setting of underlying A-fib. -Eliquis held early on during the hospitalization in anticipation of excisional debridement and resumed today 11/15/2022.   6.  Hypothyroidism -Synthroid.    7.  Hypotension -Midodrine.   8.  PVD status post right BKA -Continue statin.   -Eliquis was held for excisional debridement and will be resumed today.   9.  Morbid obesity -BMI 41.77 -Lifestyle modification. -Outpatient follow-up with PCP.  10.  Ambulatory dysfunction -Patient noted unable to bear weight to assist with transfers since BKA. -Being fitted for prosthesis. -PT/OT. -May be a candidate for CIR postop.  11.  Prolonged QTc -Repeat EKG with resolution of QTc prolongation.   12.  MDD/GAD with panic  -Patient seems to have a brighter affect today and more interactive.   - Patient seen in consultation by psychiatry -Patient currently on vortioxetine and been transitioned to venlafaxine per psychiatry. -Psychiatry was following but have signed off as of 11/14/2022.   13.  Constipation -Patient with complaints of constipation. -Sorbitol p.o. every 3 hours x 2 doses was ordered 11/14/2022, no results. -Sorbitol p.o. x 2 doses, Dulcolax suppository. -  Discontinue Colace. -Senokot-S twice daily.  14.  Stage II pressure injury of the sacrum, POA Pressure Injury 08/27/22 Coccyx Mid Stage 2 -  Partial thickness loss of dermis presenting as a shallow open injury with a red, pink wound bed  without slough. 47mmx5mmx0 red center unattached edges (Active)  08/27/22 1000  Location: Coccyx  Location Orientation: Mid  Staging: Stage 2 -  Partial thickness loss of dermis presenting as a shallow open injury with a red, pink wound bed without slough.  Wound Description (Comments): 28mmx5mmx0 red center unattached edges  Present on Admission:   WOC RN consulted.    -   DVT prophylaxis: Eliquis resumed 11/15/2022.   Code Status: Full Family Communication: Updated patient, husband at bedside. Disposition: TBD  Status is: Inpatient Remains inpatient appropriate because: Severity of illness   Consultants:  WOC RN: Ria Bush, RN 11/10/2022 Nephrology: Dr. Signe Colt 11/11/2022 Psychiatry: Dr. Gasper Sells 11/11/2022 Orthopedics: Dr. Lajoyce Corners 11/12/2022  Procedures:  Excisional debridement of lower extremity wound with excision skin and soft tissue muscle and fascia/application of wound VAC/application of Kerecis micro graft 95 cm per orthopedics: Dr. Lajoyce Corners 11/14/2022  Antimicrobials:  Anti-infectives (From admission, onward)    Start     Dose/Rate Route Frequency Ordered Stop   11/15/22 1800  ceFEPIme (MAXIPIME) 2 g in sodium chloride 0.9 % 100 mL IVPB        2 g 200 mL/hr over 30 Minutes Intravenous Every T-Th-Sa (1800) 11/14/22 1233     11/14/22 2300  vancomycin (VANCOCIN) IVPB 1000 mg/200 mL premix        1,000 mg 200 mL/hr over 60 Minutes Intravenous  Once 11/14/22 2207 11/15/22 0021   11/14/22 1500  vancomycin (VANCOCIN) IVPB 1000 mg/200 mL premix  Status:  Discontinued        1,000 mg 200 mL/hr over 60 Minutes Intravenous  Once 11/14/22 1433 11/14/22 2207   11/14/22 1400  metroNIDAZOLE (FLAGYL) IVPB 500 mg        500 mg 100 mL/hr over 60 Minutes Intravenous Every 12 hours 11/14/22 1221     11/14/22 1400  ceFEPIme (MAXIPIME) 2 g in sodium chloride 0.9 % 100 mL IVPB        2 g 200 mL/hr over 30 Minutes Intravenous  Once 11/14/22 1233     11/14/22 0923  vancomycin (VANCOCIN)  powder  Status:  Discontinued          As needed 11/14/22 0924 11/14/22 0930   11/14/22 0745  vancomycin (VANCOCIN) IVPB 1000 mg/200 mL premix  Status:  Discontinued        1,000 mg 200 mL/hr over 60 Minutes Intravenous On call to O.R. 11/14/22 0631 11/14/22 1032   11/14/22 0730  levofloxacin (LEVAQUIN) IVPB 500 mg  Status:  Discontinued        500 mg 100 mL/hr over 60 Minutes Intravenous On call to O.R. 11/14/22 0631 11/14/22 1032   11/13/22 1730  fluconazole (DIFLUCAN) tablet 150 mg        150 mg Oral  Once 11/13/22 1630 11/13/22 1707   11/12/22 1145  vancomycin (VANCOCIN) IVPB 1000 mg/200 mL premix  Status:  Discontinued        1,000 mg 200 mL/hr over 60 Minutes Intravenous On call to O.R. 11/12/22 1052 11/12/22 1055   11/12/22 1145  levofloxacin (LEVAQUIN) IVPB 500 mg  Status:  Discontinued        500 mg 100 mL/hr over 60 Minutes Intravenous On call to O.R. 11/12/22 1052 11/12/22 1055  11/12/22 1054  vancomycin (VANCOCIN) 1-5 GM/200ML-% IVPB  Status:  Discontinued       Note to Pharmacy: [facility], [place]: cabinet override      11/12/22 1054 11/12/22 1114   11/12/22 0815  vancomycin (VANCOCIN) IVPB 1000 mg/200 mL premix        1,000 mg 200 mL/hr over 60 Minutes Intravenous  Once 11/12/22 0718 11/12/22 2040   11/11/22 1354  vancomycin (VANCOCIN) IVPB 1000 mg/200 mL premix        1,000 mg 200 mL/hr over 60 Minutes Intravenous Every T-Th-Sa (Hemodialysis) 11/11/22 1354     11/10/22 1700  piperacillin-tazobactam (ZOSYN) IVPB 2.25 g  Status:  Discontinued        2.25 g 100 mL/hr over 30 Minutes Intravenous Every 8 hours 11/10/22 1645 11/14/22 1221   11/10/22 1645  vancomycin (VANCOREADY) IVPB 2000 mg/400 mL        2,000 mg 200 mL/hr over 120 Minutes Intravenous  Once 11/10/22 1632 11/11/22 1930         Subjective: In hemodialysis.  Denies any chest pain or shortness of breath.  No abdominal pain.  Still with complaints of constipation with no improvement after sorbitol yesterday.   Left lower extremity pain.   Objective: Vitals:   11/15/22 0930 11/15/22 1000 11/15/22 1030 11/15/22 1130  BP: (!) 87/42 (!) 85/42 (!) 109/49 (!) 110/49  Pulse: (!) 57 (!) 57 60 (!) 59  Resp: 10 (!) 9 11 10   Temp:      TempSrc:      SpO2: 93% 91% 98% 95%  Weight:      Height:        Intake/Output Summary (Last 24 hours) at 11/15/2022 1142 Last data filed at 11/15/2022 5643 Gross per 24 hour  Intake 480 ml  Output 2000 ml  Net -1520 ml    Filed Weights   11/11/22 1449 11/12/22 1056 11/15/22 0858  Weight: 94.4 kg 100 kg 100 kg    Examination:  General exam: NAD Respiratory system: CTAB anterior lung fields.  No wheezes, no crackles, no rhonchi.  Fair air movement.  Speaking in full sentences.  Cardiovascular system: Regular rate and rhythm no murmurs rubs or gallops.  No JVD.  No lower extremity edema.  Gastrointestinal system: Abdomen is soft, nontender, nondistended, positive bowel sounds.  No rebound.  No guarding.  Central nervous system: Alert and oriented. No focal neurological deficits. Extremities: Left lower extremity bandaged and wound VAC in place.  Status post right BKA. Skin: No rashes, lesions or ulcers Psychiatry: Judgement and insight appear normal. Mood & affect appropriate.     Data Reviewed: I have personally reviewed following labs and imaging studies  CBC: Recent Labs  Lab 11/10/22 1345 11/10/22 1649 11/11/22 0135 11/12/22 0905 11/13/22 0052 11/14/22 0702 11/15/22 0415  WBC 7.0  --  6.7 5.3 5.8 5.8 4.8  NEUTROABS 5.5  --   --  3.7 4.4 3.8 3.9  HGB 11.0*   < > 8.6* 9.0* 9.5* 9.0* 8.3*  HCT 36.5   < > 28.2* 29.8* 30.7* 29.3* 26.9*  MCV 80.2  --  77.5* 77.8* 79.9* 80.5 77.3*  PLT 125*  --  138* 127* 133* 151 157   < > = values in this interval not displayed.     Basic Metabolic Panel: Recent Labs  Lab 11/11/22 0135 11/12/22 0905 11/13/22 0052 11/14/22 0702 11/15/22 0415  NA 130* 134* 133* 135 131*  K 3.2* 3.0* 3.5 3.6 3.5  CL 93*  96*  96* 94* 94*  CO2 21* 26 25 22 23   GLUCOSE 486* 335* 260* 84 461*  BUN 65* 51* 59* 89* 61*  CREATININE 4.12* 3.79* 4.58* 6.10* 4.33*  CALCIUM 8.4* 8.5* 8.5* 8.5* 8.5*  PHOS  --  5.8* 6.7* 7.8* 5.9*     GFR: Estimated Creatinine Clearance: 13.6 mL/min (A) (by C-G formula based on SCr of 4.33 mg/dL (H)).  Liver Function Tests: Recent Labs  Lab 11/10/22 1345 11/12/22 0905 11/13/22 0052 11/14/22 0702 11/15/22 0415  AST 30  --   --   --   --   ALT 19  --   --   --   --   ALKPHOS 201*  --   --   --   --   BILITOT 0.9  --   --   --   --   PROT 6.5  --   --   --   --   ALBUMIN 3.0* 2.2* 2.2* 2.2* 2.5*     CBG: Recent Labs  Lab 11/14/22 1038 11/14/22 1902 11/14/22 2211 11/15/22 0246 11/15/22 0756  GLUCAP 146* 245* 376* 452* 389*      Recent Results (from the past 240 hour(s))  Blood Cultures x 2 sites     Status: None   Collection Time: 11/10/22 12:58 PM   Specimen: BLOOD RIGHT ARM  Result Value Ref Range Status   Specimen Description BLOOD RIGHT ARM  Final   Special Requests   Final    BOTTLES DRAWN AEROBIC AND ANAEROBIC Blood Culture results may not be optimal due to an inadequate volume of blood received in culture bottles   Culture   Final    NO GROWTH 5 DAYS Performed at [facility], [address] [place], [place] [address]    Report Status 11/15/2022 FINAL  Final  Blood Cultures x 2 sites     Status: None   Collection Time: 11/10/22  4:41 PM   Specimen: BLOOD RIGHT FOREARM  Result Value Ref Range Status   Specimen Description BLOOD RIGHT FOREARM  Final   Special Requests   Final    BOTTLES DRAWN AEROBIC AND ANAEROBIC Blood Culture adequate volume   Culture   Final    NO GROWTH 5 DAYS Performed at [facility], [address], [place], [place] [address]    Report Status 11/15/2022 FINAL  Final  Surgical pcr screen     Status: Abnormal   Collection Time: 11/11/22 11:31 PM   Specimen: Nasal Mucosa; Nasal Swab  Result Value Ref Range  Status   MRSA, PCR NEGATIVE NEGATIVE Final   Staphylococcus aureus POSITIVE (A) NEGATIVE Final    Comment: (NOTE) The Xpert SA Assay (FDA approved for NASAL specimens in patients 3 years of age and older), is one component of a comprehensive surveillance program. It is not intended to diagnose infection nor to guide or monitor treatment. Performed at [facility], [address], [place], [place] [address]   Aerobic/Anaerobic Culture w Gram Stain (surgical/deep wound)     Status: None (Preliminary result)   Collection Time: 11/14/22  9:19 AM   Specimen: Soft Tissue, Other  Result Value Ref Range Status   Specimen Description TISSUE  Final   Special Requests NONE  Final   Gram Stain   Final    MODERATE WBC PRESENT,BOTH PMN AND MONONUCLEAR RARE SQUAMOUS EPITHELIAL CELLS PRESENT NO ORGANISMS SEEN Performed at [facility], [address], [place], [place] [address]  Culture RARE GRAM NEGATIVE RODS  Final   Report Status PENDING  Incomplete         Radiology Studies: No results found.      Scheduled Meds:  (feeding supplement) PROSource Plus  30 mL Oral BID BM   acetaminophen  1,000 mg Oral TID   amiodarone  200 mg Oral Daily   apixaban  5 mg Oral BID   ascorbic acid  500 mg Oral Daily   atorvastatin  80 mg Oral Daily   Chlorhexidine Gluconate Cloth  6 each Topical Q0600   Chlorhexidine Gluconate Cloth  6 each Topical Daily   clotrimazole  1 Applicatorful Vaginal QHS   docusate sodium  100 mg Oral BID   feeding supplement (NEPRO CARB STEADY)  237 mL Oral Daily   folic acid  1 mg Oral Daily   heparin sodium (porcine)       insulin aspart  0-5 Units Subcutaneous QHS   insulin aspart  0-6 Units Subcutaneous TID WC   insulin glargine-yfgn  35 Units Subcutaneous BID   levothyroxine  75 mcg Oral QAC breakfast   LORazepam  0.5 mg Oral QHS   midodrine  10 mg Oral Q T,Th,Sa-HD   mupirocin ointment  1 Application Nasal BID   pantoprazole  40 mg Oral BID AC    pregabalin  150 mg Oral BID   ramelteon  8 mg Oral QHS   senna-docusate  2 tablet Oral BID   sevelamer carbonate  1,600 mg Oral TID WC   sodium hypochlorite   Irrigation BID   venlafaxine XR  75 mg Oral Q breakfast   vortioxetine HBr  10 mg Oral Daily   zinc sulfate  220 mg Oral Daily   Continuous Infusions:  sodium chloride     albumin human 25 g (11/15/22 0940)   albumin human     ceFEPime (MAXIPIME) IV     ceFEPime (MAXIPIME) IV     methocarbamol (ROBAXIN) IV     metronidazole 500 mg (11/15/22 0201)   vancomycin 1,000 mg (11/15/22 1115)     LOS: 5 days    Time spent: 35 minutes    Ramiro Harvest, MD [facility]   To contact the attending provider between 7A-7P or the covering provider during after hours 7P-7A, please log into the web site [email or web address] and access using universal  AFB password for that web site. If you do not have the password, please call the hospital operator.  11/15/2022, 11:42 AM

## 2022-11-15 NOTE — Progress Notes (Signed)
Occupational Therapy Treatment Patient Details Name: Susan [name] MRN: [id number] DOB: [birth year] Today's Date: 11/15/2022   History of present illness 72yo female presenting 6/17 with maggot infestation of her LLE. S/P debridement of necrotic ulcer lateral left calf 6/21. h/o CAD, chronic diastolic CHF, HLD, TTS HD, HTN, PVD s/p R BKA, hypothyroidism, morbid obesity, afib, and DM.   OT comments  Pt progressing toward established OT goals. Pt now s/p I&D. Pt with LE pain limiting tolerance of prolonged weightbearing, however, continuing to make steady progress. Performing STS transfers with max A +2 with use of stedy on 2 attempts and mod A +2 on third attempt. Pt with good motivation and carryover of previously learned strategies to optimize mobility. Pt remains an excellent candidate for intensive rehabilitation. Will continue to follow.    Recommendations for follow up therapy are one component of a multi-disciplinary discharge planning process, led by the attending physician.  Recommendations may be updated based on patient status, additional functional criteria and insurance authorization.    Assistance Recommended at Discharge Frequent or constant Supervision/Assistance  Patient can return home with the following  A lot of help with walking and/or transfers;A lot of help with bathing/dressing/bathroom;Assist for transportation;Help with stairs or ramp for entrance;Assistance with cooking/housework   Equipment Recommendations  None recommended by OT    Recommendations for Other Services Rehab consult    Precautions / Restrictions Precautions Precautions: Fall Precaution Comments: LLE wound Required Braces or Orthoses:  (Shrinker for Rt residual limb (BKA)) Restrictions Weight Bearing Restrictions: Yes LLE Weight Bearing: Weight bearing as tolerated       Mobility Bed Mobility Overal bed mobility: Needs Assistance Bed Mobility: Supine to Sit, Sit to Supine     Supine to  sit: HOB elevated, Min assist Sit to supine: Min guard   General bed mobility comments: Min guard for safety, HOB elevated, required extra time, assist for vac tubing.    Transfers Overall transfer level: Needs assistance Equipment used: Ambulation equipment used Transfers: Sit to/from Stand Sit to Stand: Max assist, +2 physical assistance, From elevated surface           General transfer comment: Performed sit<>stand with Antony Salmon Max A +2 for the first 2 attempts with cues for technique and upright posture for full hip extenstion to neutral. 3rd attempt Mod assist +2, fear of fallnig forward. Cues for UE use to push upright. Tolerated each stand up to 10 seconds. Was not utilizing knee pad on Lt first two attempts, third attempt tolerated better with this block to assist for leverage.     Balance Overall balance assessment: Needs assistance Sitting-balance support: No upper extremity supported, Feet supported Sitting balance-Leahy Scale: Fair Sitting balance - Comments: able to balance sitting EOB   Standing balance support: Bilateral upper extremity supported, Reliant on assistive device for balance Standing balance-Leahy Scale: Zero Standing balance comment: Held rail on stedy, Mod A +2 to stabilize.                           ADL either performed or assessed with clinical judgement   ADL Overall ADL's : Needs assistance/impaired Eating/Feeding: Set up;Bed level Eating/Feeding Details (indicate cue type and reason): difficulty opening packages, drinks, hand weakness/neuropathy                     Toilet Transfer: Maximal assistance;+2 for physical assistance;+2 for safety/equipment Toilet Transfer Details (indicate cue type and reason): with [facility]  Functional mobility during ADLs: Maximal assistance;+2 for physical assistance;+2 for safety/equipment      Extremity/Trunk Assessment Upper Extremity Assessment Upper Extremity Assessment: RUE  deficits/detail;LUE deficits/detail RUE Deficits / Details: B hand neuropathy, B shoudler pain with AROM, functional ROM RUE Sensation: history of peripheral neuropathy RUE Coordination: decreased fine motor LUE Deficits / Details: B hand neuropathy, B shoudler pain with AROM, functional ROM LUE Sensation: history of peripheral neuropathy LUE Coordination: decreased fine motor   Lower Extremity Assessment Lower Extremity Assessment: RLE deficits/detail;LLE deficits/detail RLE Deficits / Details: hx BKA LLE Deficits / Details: Bandaged, gross weakness present, able to lift LE agains gravity through partial range. LLE: Unable to fully assess due to pain        Vision       Perception     Praxis      Cognition Arousal/Alertness: Awake/alert Behavior During Therapy: WFL for tasks assessed/performed Overall Cognitive Status: Within Functional Limits for tasks assessed                                          Exercises      Shoulder Instructions       General Comments Requested air mattress ; RN reports pt on list due to shortage of equipment. Pt encouraged to continue rolling every 30 min for pressure relief. She can perform this on her own and has been doing so.    Pertinent Vitals/ Pain       Pain Assessment Pain Assessment: Faces Faces Pain Scale: Hurts even more Pain Location: LLE Pain Descriptors / Indicators: Throbbing  Home Living Family/patient expects to be discharged to:: Private residence Living Arrangements: Spouse/significant other Available Help at Discharge: Family;Available PRN/intermittently Type of Home: House Home Access: Ramped entrance     Home Layout: One level     Bathroom Shower/Tub: Tub/shower unit;Door   Bathroom Toilet: Handicapped height Bathroom Accessibility:  (uses depends)   Home Equipment: Grab bars - tub/shower;Rolling Walker (2 wheels);Rollator (4 wheels);BSC/3in1;Cane - quad;Shower seat;Wheelchair - manual    Additional Comments: Husband is out on FMLA until Sept. She was scheduled to get a prosthesis for RLE prior to admission      Prior Functioning/Environment              Frequency  Min 2X/week        Progress Toward Goals  OT Goals(current goals can now be found in the care plan section)  Progress towards OT goals: Progressing toward goals  Acute Rehab OT Goals Patient Stated Goal: get to inpatient rehab and be able to transfer OT Goal Formulation: With patient Time For Goal Achievement: 11/27/22 Potential to Achieve Goals: Good ADL Goals Pt Will Perform Lower Body Dressing: with min assist;with adaptive equipment;sitting/lateral leans Pt Will Transfer to Toilet: with min assist;bedside commode Pt Will Perform Toileting - Clothing Manipulation and hygiene: with min assist;sitting/lateral leans;with adaptive equipment Pt/caregiver will Perform Home Exercise Program: Both right and left upper extremity;Increased strength;With theraband;With written HEP provided  Plan Discharge plan remains appropriate;Frequency remains appropriate    Co-evaluation    PT/OT/SLP Co-Evaluation/Treatment: Yes Reason for Co-Treatment: For patient/therapist safety;To address functional/ADL transfers PT goals addressed during session: Mobility/safety with mobility;Balance;Proper use of DME        AM-PAC OT "6 Clicks" Daily Activity     Outcome Measure   Help from another person eating meals?: A Little Help from another person  taking care of personal grooming?: A Little Help from another person toileting, which includes using toliet, bedpan, or urinal?: Total Help from another person bathing (including washing, rinsing, drying)?: A Lot Help from another person to put on and taking off regular upper body clothing?: A Little Help from another person to put on and taking off regular lower body clothing?: A Lot 6 Click Score: 14    End of Session Equipment Utilized During Treatment: Gait  belt;Other (comment) (stedy)  OT Visit Diagnosis: Unsteadiness on feet (R26.81);Other abnormalities of gait and mobility (R26.89);Muscle weakness (generalized) (M62.81);Other symptoms and signs involving the nervous system (R29.898);Pain Pain - Right/Left: Left Pain - part of body: Leg   Activity Tolerance Patient tolerated treatment well   Patient Left in bed;with call bell/phone within reach;with bed alarm set   Nurse Communication Mobility status        Time: 1450-1515 OT Time Calculation (min): 25 min  Charges: OT General Charges $OT Visit: 1 Visit OT Treatments $Self Care/Home Management : 8-22 mins  Tyler Deis, OTR/L [facility] Acute Rehabilitation Office: [phone]   Myrla Halsted 11/15/2022, 5:18 PM

## 2022-11-15 NOTE — Progress Notes (Signed)
Physical Therapy Treatment Patient Details Name: Susan [name] MRN: [id number] DOB: [birth year] Today's Date: 11/15/2022   History of Present Illness 72yo female presenting 6/17 with maggot infestation of her LLE. S/P debridement of necrotic ulcer lateral left calf 6/21. h/o CAD, chronic diastolic CHF, HLD, TTS HD, HTN, PVD s/p R BKA, hypothyroidism, morbid obesity, afib, and DM.    PT Comments    Again showing remarkable effort with therapies, post-op day #1 debridement of Lt lateral calf. Pt stood 3 times with Earnest Conroy assist +2 twice, and progressed to mod assist +2 on final attempt. Recommend continued use of this device to further progress strength and independence for WB through LLE (She has not received her prosthesis yet, it is waiting to be tried on and picked up at orthotist office; finished being made just prior to admission.) We will avoid lateral transfers with sliding board due to pt reporting one open wound on buttocks and 2 recently healed wound. With husband will still require hoyer lift, however she will progress well with adequate rehab focused on increasing independence with transfer training. Will continue to follow and progress during admission.    Recommendations for follow up therapy are one component of a multi-disciplinary discharge planning process, led by the attending physician.  Recommendations may be updated based on patient status, additional functional criteria and insurance authorization.     Assistance Recommended at Discharge Frequent or constant Supervision/Assistance  Patient can return home with the following Two people to help with walking and/or transfers;A lot of help with bathing/dressing/bathroom;Assistance with cooking/housework;Assist for transportation;Help with stairs or ramp for entrance   Equipment Recommendations  None recommended by PT    Recommendations for Other Services Rehab consult     Precautions / Restrictions  Precautions Precautions: Fall Precaution Comments: LLE wound Required Braces or Orthoses:  (Shrinker for Rt residual limb (BKA)) Restrictions Weight Bearing Restrictions: Yes LLE Weight Bearing: Weight bearing as tolerated     Mobility  Bed Mobility Overal bed mobility: Needs Assistance Bed Mobility: Supine to Sit, Sit to Supine     Supine to sit: HOB elevated, Min assist Sit to supine: Min guard   General bed mobility comments: Min guard for safety, HOB elevated, required extra time, assist for vac tubing.    Transfers Overall transfer level: Needs assistance Equipment used: Ambulation equipment used Transfers: Sit to/from Stand Sit to Stand: Max assist, +2 physical assistance, From elevated surface           General transfer comment: Performed sit<>stand with Antony Salmon Max A +2 for the first 2 attempts with cues for technique and upright posture for full hip extenstion to neutral. 3rd attempt Mod assist +2, fear of fallnig forward. Cues for UE use to push upright. Tolerated each stand up to 10 seconds. Was not utilizing knee pad on Lt first two attempts, third attempt tolerated better with this block to assist for leverage.    Ambulation/Gait               General Gait Details: non ambulatory for quite some time.   Stairs             Wheelchair Mobility    Modified Rankin (Stroke Patients Only)       Balance Overall balance assessment: Needs assistance Sitting-balance support: No upper extremity supported, Feet supported Sitting balance-Leahy Scale: Fair Sitting balance - Comments: able to balance sitting EOB   Standing balance support: Bilateral upper extremity supported, Reliant on assistive device for balance Standing  balance-Leahy Scale: Zero Standing balance comment: Held rail on stedy, Mod A +2 to stabilize.                            Cognition Arousal/Alertness: Awake/alert Behavior During Therapy: WFL for tasks  assessed/performed Overall Cognitive Status: Within Functional Limits for tasks assessed                                          Exercises      General Comments General comments (skin integrity, edema, etc.): Requested air mattress ; RN reports pt on list due to shortage of equipment. Pt encouraged to continue rolling every 30 min for pressure relief. She can perform this on her own and has been doing so.      Pertinent Vitals/Pain Pain Assessment Pain Assessment: Faces Faces Pain Scale: Hurts even more Pain Location: LLE Pain Descriptors / Indicators: Throbbing Pain Intervention(s): Monitored during session, Repositioned    Home Living Family/patient expects to be discharged to:: Private residence Living Arrangements: Spouse/significant other Available Help at Discharge: Family;Available PRN/intermittently Type of Home: House Home Access: Ramped entrance       Home Layout: One level Home Equipment: Grab bars - tub/shower;Rolling Walker (2 wheels);Rollator (4 wheels);BSC/3in1;Cane - quad;Shower seat;Wheelchair - manual Additional Comments: Husband is out on FMLA until Sept. She was scheduled to get a prosthesis for RLE prior to admission    Prior Function            PT Goals (current goals can now be found in the care plan section) Acute Rehab PT Goals Patient Stated Goal: Get well, be able to walk again. PT Goal Formulation: With patient Time For Goal Achievement: 11/27/22 Potential to Achieve Goals: Good Progress towards PT goals: Progressing toward goals    Frequency    Min 3X/week      PT Plan Current plan remains appropriate    Co-evaluation PT/OT/SLP Co-Evaluation/Treatment: Yes Reason for Co-Treatment: For patient/therapist safety;To address functional/ADL transfers PT goals addressed during session: Mobility/safety with mobility;Balance;Proper use of DME        AM-PAC PT "6 Clicks" Mobility   Outcome Measure  Help needed  turning from your back to your side while in a flat bed without using bedrails?: A Little Help needed moving from lying on your back to sitting on the side of a flat bed without using bedrails?: A Little Help needed moving to and from a bed to a chair (including a wheelchair)?: A Lot Help needed standing up from a chair using your arms (e.g., wheelchair or bedside chair)?: Total Help needed to walk in hospital room?: Total Help needed climbing 3-5 steps with a railing? : Total 6 Click Score: 11    End of Session Equipment Utilized During Treatment: Gait belt Activity Tolerance: Patient tolerated treatment well Patient left: in bed;with call bell/phone within reach;with bed alarm set (Heels floating, LE elevated) Nurse Communication: Mobility status Contractor for reported wounds) PT Visit Diagnosis: Muscle weakness (generalized) (M62.81);Difficulty in walking, not elsewhere classified (R26.2);Pain Pain - Right/Left: Left Pain - part of body: Leg     Time: 1610-9604 PT Time Calculation (min) (ACUTE ONLY): 29 min  Charges:  $Therapeutic Activity: 8-22 mins                     Kathlyn Sacramento, PT, DPT [facility]  Rehabilitation Services Physical Therapist Office: [phone] Website: [email or web address]    Berton Mount 11/15/2022, 4:12 PM

## 2022-11-15 NOTE — Progress Notes (Signed)
PT Cancellation Note  Patient Details Name: Susan [name] MRN: [id number] DOB: December 01, 1950   Cancelled Treatment:    Reason Eval/Treat Not Completed: Patient at procedure or test/unavailable  Off unit for dialysis this morning. Will check back this afternoon for follow-up with PT.   Kathlyn Sacramento, PT, DPT [facility]  Rehabilitation Services Physical Therapist Office: [phone] Website: [email or web address]   Berton Mount 11/15/2022, 11:01 AM

## 2022-11-15 NOTE — Progress Notes (Signed)
Patient back to room for dialysis.

## 2022-11-15 NOTE — Progress Notes (Signed)
Patient ID: Susan [name], female   DOB: Sep 05, 1950, 72 y.o.   MRN: [id number] Patient is status post debridement necrotic ulcer lateral left calf with maggots in the wound bed.  Patient has good healthy bleeding from the wound VAC showing 200 cc.  Anticipate patient may discharge when cultures are finalized and there is an antibiotic for her to discharge on.  Patient will discharge on the Praveena plus portable wound VAC pump.

## 2022-11-15 NOTE — Progress Notes (Signed)
Air mattress requested for this patient. None are available at this time.

## 2022-11-15 NOTE — Plan of Care (Signed)
Patient was awake the whole night until 4 am. Suggested ways to induce sleep but patient verbalized she is claustrophobic and 'afraid of darkness'. Pt has been up watching television with lights on and door open wide.   Problem: Education: Goal: Knowledge of General Education information will improve Description: Including pain rating scale, medication(s)/side effects and non-pharmacologic comfort measures Outcome: Progressing   Problem: Health Behavior/Discharge Planning: Goal: Ability to manage health-related needs will improve Outcome: Progressing   Problem: Clinical Measurements: Goal: Ability to maintain clinical measurements within normal limits will improve Outcome: Progressing   Problem: Pain Managment: Goal: General experience of comfort will improve Outcome: Progressing   Problem: Safety: Goal: Ability to remain free from injury will improve Outcome: Progressing   Problem: Coping: Goal: Level of anxiety will decrease Outcome: Not Progressing

## 2022-11-16 ENCOUNTER — Encounter (HOSPITAL_COMMUNITY): Payer: Self-pay | Admitting: Orthopedic Surgery

## 2022-11-16 DIAGNOSIS — E1142 Type 2 diabetes mellitus with diabetic polyneuropathy: Secondary | ICD-10-CM | POA: Diagnosis not present

## 2022-11-16 DIAGNOSIS — T148XXA Other injury of unspecified body region, initial encounter: Secondary | ICD-10-CM | POA: Diagnosis not present

## 2022-11-16 DIAGNOSIS — E782 Mixed hyperlipidemia: Secondary | ICD-10-CM | POA: Diagnosis not present

## 2022-11-16 DIAGNOSIS — L03116 Cellulitis of left lower limb: Secondary | ICD-10-CM | POA: Diagnosis not present

## 2022-11-16 LAB — CBC
HCT: 27 % — ABNORMAL LOW (ref 36.0–46.0)
Hemoglobin: 8.5 g/dL — ABNORMAL LOW (ref 12.0–15.0)
MCH: 24.6 pg — ABNORMAL LOW (ref 26.0–34.0)
MCHC: 31.5 g/dL (ref 30.0–36.0)
MCV: 78.3 fL — ABNORMAL LOW (ref 80.0–100.0)
Platelets: 159 10*3/uL (ref 150–400)
RBC: 3.45 MIL/uL — ABNORMAL LOW (ref 3.87–5.11)
RDW: 16.5 % — ABNORMAL HIGH (ref 11.5–15.5)
WBC: 5.3 10*3/uL (ref 4.0–10.5)
nRBC: 0 % (ref 0.0–0.2)

## 2022-11-16 LAB — GLUCOSE, CAPILLARY
Glucose-Capillary: 296 mg/dL — ABNORMAL HIGH (ref 70–99)
Glucose-Capillary: 258 mg/dL — ABNORMAL HIGH (ref 70–99)
Glucose-Capillary: 238 mg/dL — ABNORMAL HIGH (ref 70–99)
Glucose-Capillary: 194 mg/dL — ABNORMAL HIGH (ref 70–99)
Glucose-Capillary: 210 mg/dL — ABNORMAL HIGH (ref 70–99)

## 2022-11-16 LAB — RENAL FUNCTION PANEL
Albumin: 2.6 g/dL — ABNORMAL LOW (ref 3.5–5.0)
Anion gap: 10 (ref 5–15)
BUN: 42 mg/dL — ABNORMAL HIGH (ref 8–23)
CO2: 26 mmol/L (ref 22–32)
Calcium: 8.6 mg/dL — ABNORMAL LOW (ref 8.9–10.3)
Chloride: 96 mmol/L — ABNORMAL LOW (ref 98–111)
Creatinine, Ser: 3.3 mg/dL — ABNORMAL HIGH (ref 0.44–1.00)
GFR, Estimated: 14 mL/min — ABNORMAL LOW (ref >60)
Glucose, Bld: 325 mg/dL — ABNORMAL HIGH (ref 70–99)
Phosphorus: 4.1 mg/dL (ref 2.5–4.6)
Potassium: 3.5 mmol/L (ref 3.5–5.1)
Sodium: 132 mmol/L — ABNORMAL LOW (ref 135–145)

## 2022-11-16 MED ORDER — INSULIN GLARGINE-YFGN 100 UNIT/ML ~~LOC~~ SOLN
40.0000 [IU] | Freq: Two times a day (BID) | SUBCUTANEOUS | Status: DC
  Administered 2022-11-16 – 2022-11-17 (×2): 40 [IU] via SUBCUTANEOUS
  Filled 2022-11-16 (×3): qty 0.4

## 2022-11-16 MED ORDER — OXYCODONE HCL 5 MG PO TABS
5.0000 mg | ORAL_TABLET | ORAL | Status: DC | PRN
  Administered 2022-11-16 – 2022-11-19 (×6): 5 mg via ORAL
  Filled 2022-11-16 (×7): qty 1

## 2022-11-16 MED ORDER — POLYETHYLENE GLYCOL 3350 17 G PO PACK
17.0000 g | PACK | Freq: Every day | ORAL | Status: DC
  Administered 2022-11-17 – 2022-11-18 (×2): 17 g via ORAL
  Filled 2022-11-16 (×4): qty 1

## 2022-11-16 MED ORDER — DARBEPOETIN ALFA 150 MCG/0.3ML IJ SOSY
150.0000 ug | PREFILLED_SYRINGE | Freq: Once | INTRAMUSCULAR | Status: AC
  Administered 2022-11-16: 150 ug via SUBCUTANEOUS
  Filled 2022-11-16: qty 0.3

## 2022-11-16 NOTE — Plan of Care (Signed)
  Problem: Education: Goal: Knowledge of General Education information will improve Description: Including pain rating scale, medication(s)/side effects and non-pharmacologic comfort measures Outcome: Progressing   Problem: Activity: Goal: Risk for activity intolerance will decrease Outcome: Progressing   Problem: Coping: Goal: Level of anxiety will decrease Outcome: Progressing   

## 2022-11-16 NOTE — Progress Notes (Signed)
[facility] Progress Note   Subjective:  Patient seen and examined at bedside.  Reports feeling exhausted after dialysis yesterday due to not sleeping well the night before.  Denies CP, SOB, abdominal pain and n/v/d.  Admits to pain in L leg.   Objective Vitals:   11/15/22 1600 11/15/22 2004 11/16/22 0419 11/16/22 0818  BP: (!) 114/48 (!) 112/50 (!) 84/54 (!) 107/48  Pulse: 61 63 (!) 56 (!) 57  Resp:  17 17 18   Temp: 97.8 F (36.6 C) (!) 97.5 F (36.4 C)  97.7 F (36.5 C)  TempSrc:  Oral  Axillary  SpO2: 99% 99% 100% 100%  Weight:      Height:       Physical Exam General:chronically ill appearing female in NAD Heart:RRR, no mrg Lungs:CTAB, nml WOB on RA Abdomen:soft, NTND Extremities:L leg dressed, wound vac in place, 1+ edema in hips, R BKA Dialysis Access: R TDC, L AVF no bruit   Filed Weights   11/11/22 1449 11/12/22 1056 11/15/22 0858  Weight: 94.4 kg 100 kg 100 kg    Intake/Output Summary (Last 24 hours) at 11/16/2022 0841 Last data filed at 11/16/2022 1610 Gross per 24 hour  Intake 687.42 ml  Output 1350 ml  Net -662.58 ml    Additional Objective Labs: Basic Metabolic Panel: Recent Labs  Lab 11/14/22 0702 11/15/22 0415 11/16/22 0152  NA 135 131* 132*  K 3.6 3.5 3.5  CL 94* 94* 96*  CO2 22 23 26   GLUCOSE 84 461* 325*  BUN 89* 61* 42*  CREATININE 6.10* 4.33* 3.30*  CALCIUM 8.5* 8.5* 8.6*  PHOS 7.8* 5.9* 4.1   Liver Function Tests: Recent Labs  Lab 11/10/22 1345 11/12/22 0905 11/14/22 0702 11/15/22 0415 11/16/22 0152  AST 30  --   --   --   --   ALT 19  --   --   --   --   ALKPHOS 201*  --   --   --   --   BILITOT 0.9  --   --   --   --   PROT 6.5  --   --   --   --   ALBUMIN 3.0*   < > 2.2* 2.5* 2.6*   < > = values in this interval not displayed.    CBC: Recent Labs  Lab 11/12/22 0905 11/13/22 0052 11/14/22 0702 11/15/22 0415 11/16/22 0152  WBC 5.3 5.8 5.8 4.8 5.3  NEUTROABS 3.7 4.4 3.8 3.9  --   HGB 9.0* 9.5*  9.0* 8.3* 8.5*  HCT 29.8* 30.7* 29.3* 26.9* 27.0*  MCV 77.8* 79.9* 80.5 77.3* 78.3*  PLT 127* 133* 151 157 159   CBG: Recent Labs  Lab 11/15/22 1446 11/15/22 1744 11/15/22 2115 11/16/22 0304 11/16/22 0740  GLUCAP 189* 267* 295* 296* 258*    Medications:  sodium chloride     albumin human 25 g (11/15/22 0940)   ceFEPime (MAXIPIME) IV 2 g (11/15/22 2307)   methocarbamol (ROBAXIN) IV     metronidazole 500 mg (11/16/22 0229)   vancomycin Stopped (11/15/22 1336)    (feeding supplement) PROSource Plus  30 mL Oral BID BM   acetaminophen  1,000 mg Oral TID   amiodarone  200 mg Oral Daily   apixaban  5 mg Oral BID   ascorbic acid  500 mg Oral Daily   atorvastatin  80 mg Oral Daily   Chlorhexidine Gluconate Cloth  6 each Topical Q0600   Chlorhexidine Gluconate Cloth  6  each Topical Daily   clotrimazole  1 Applicatorful Vaginal QHS   feeding supplement (NEPRO CARB STEADY)  237 mL Oral Daily   folic acid  1 mg Oral Daily   insulin aspart  0-5 Units Subcutaneous QHS   insulin aspart  0-6 Units Subcutaneous TID WC   insulin glargine-yfgn  35 Units Subcutaneous BID   levothyroxine  75 mcg Oral QAC breakfast   LORazepam  0.5 mg Oral QHS   midodrine  10 mg Oral Q T,Th,Sa-HD   mupirocin ointment  1 Application Nasal BID   pantoprazole  40 mg Oral BID AC   pregabalin  150 mg Oral BID   ramelteon  8 mg Oral QHS   senna-docusate  2 tablet Oral BID   sevelamer carbonate  1,600 mg Oral TID WC   sodium hypochlorite   Irrigation BID   venlafaxine XR  75 mg Oral Q breakfast   vortioxetine HBr  10 mg Oral Daily   zinc sulfate  220 mg Oral Daily    Dialysis Orders: [facility] TTS 4:00 400/800 EDW 91.5kg 2K/2Ca TDC Heparin 4000 -Mircera 75  - last 6/8 -Calcitriol 0.5 three times per week     Assessment/Plan: Infected left lower extremity wound - maggot infestation on admission. Blood cultures neg to date. IV  cefepime/Vanc per primary. Ortho following - s/p OR debridement on 6/21, now with  wound vac ESRD -  HD TTS. Next HD on 6/25. 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 For access surgery next month  Hypertension/volume  - BP controlled. Does have some volume on exam. UF as able.  Anemia  - Hgb 8.5. ESA due - will write order to give today.  Metabolic bone disease -  Ca acceptable. Phos above goal. Continue home binders with diet.  Nutrition - Renal diet with fluid restriction.  T2DM - poorly controlled. Insulin per primary  Virgina Norfolk, PA-C [facility] 11/16/2022,8:41 AM  LOS: 6 days

## 2022-11-16 NOTE — Progress Notes (Signed)
PROGRESS NOTE    Susan [name]  ZHY:[id number] DOB: [birth year] DOA: 11/10/2022 PCP: Donita Brooks, MD    No chief complaint on file.   Brief Narrative:  72yo female with h/o CAD, chronic diastolic CHF, HLD, TTS HD, HTN, PVD s/p R BKA, hypothyroidism, morbid obesity, afib, and DM presenting with maggot infestation of her LLE. Dr. Lajoyce Corners sent her to the ER and plans for surgical debridement later this week. She was started on Vanc/Zosyn.    Assessment & Plan:   Principal Problem:   Complicated wound infection Active Problems:   Type 2 diabetes mellitus with diabetic polyneuropathy, with long-term current use of insulin (HCC)   ESRD on dialysis (HCC)   Paroxysmal atrial fibrillation (HCC)   Hyperlipemia   Hypotension   Impaired ambulation   Hypothyroidism   Prolonged QT interval   Obesity, Class III, BMI 40-49.9 (morbid obesity) (HCC)   S/P BKA (below knee amputation) unilateral, right (HCC)   Maggot infestation   Cellulitis of left lower extremity  #1 left lower extremity wound infection/necrotic wound lateral left leg with maggots -Patient noted to have failed conservative outpatient therapy and recommended for admission by orthopedics, Dr. Lajoyce Corners. -Patient seen in consultation by Dr. Lajoyce Corners and plan for debridement of the wound and application of Kerecis tissue graft and a cleanse choice Prevena wound VAC dressing. -Patient was to have debridement done on 11/12/2022 however procedure canceled and postponed till, 11/14/2022. -Patient seemed somewhat hesitant to take pain medications at this time and as such patient placed on scheduled Tylenol 1000 mg 3 times daily.   -Patient status post excisional debridement of lower extremity with application of wound VAC, 11/14/2022, cultures sent and growing Proteus mirabilis and Klebsiella pneumoniae.  Sensitivities pending.  -Patient with complaints of left lower extremity pain, however hesitant to take oxycodone as she states makes her very  drowsy and sleepy. -Patient noted to have received oxycodone 15 mg yesterday evening. -Change oxycodone to 5 mg every 4 hours as needed pain. -Continue IV cefepime, IV Flagyl. -DC vancomycin. -Will consult with ID tomorrow for antibiotic duration and recommendations. -Per orthopedics.  2.  ESRD on HD TTS -Patient noted with thrombosed aVF felt not salvageable per nephrology and patient for planned outpatient follow-up in 3 to 4 weeks. -Nephrology consulted and following.  3.  Diabetes mellitus type 2 -Hemoglobin A1c 7.9 (08/27/2022). -CBG 296 this morning. -Increase Semglee to 40 units twice daily.  SSI.   4.  Atrial fibrillation -Continue amiodarone for rate control. -Eliquis for anticoagulation.  5.  History of right renal infarct, splenic infarct -In the setting of underlying A-fib. -Eliquis held early on during the hospitalization in anticipation of excisional debridement and resumed, 11/15/2022.   6.  Hypothyroidism -Continue Synthroid.    7.  Hypotension -Midodrine.   8.  PVD status post right BKA -Continue statin.   -Eliquis was held for excisional debridement and resumed 11/15/2022.    9.  Morbid obesity -BMI 41.77 -Lifestyle modification. -Outpatient follow-up with PCP.  10.  Ambulatory dysfunction -Patient noted unable to bear weight to assist with transfers since BKA. -Being fitted for prosthesis. -PT/OT. -May be a candidate for CIR postop.  11.  Prolonged QTc -Repeat EKG with resolution of QTc prolongation.   12.  MDD/GAD with panic  -Patient seems to have a brighter affect today and more interactive.   - Patient seen in consultation by psychiatry -Patient currently on vortioxetine and been transitioned to venlafaxine per psychiatry. -Psychiatry was following but have  signed off as of 11/14/2022.   13.  Constipation -Patient with complaints of constipation on 11/15/2022, was placed on bowel regimen given sorbitol and Dulcolax suppositories and have been  multiple bowel movements. -Continue current bowel regimen of MiraLAX daily, Senokot-S twice daily..  14.  Stage II pressure injury of the sacrum, POA Pressure Injury 08/27/22 Coccyx Mid Stage 2 -  Partial thickness loss of dermis presenting as a shallow open injury with a red, pink wound bed without slough. 56mmx5mmx0 red center unattached edges (Active)  08/27/22 1000  Location: Coccyx  Location Orientation: Mid  Staging: Stage 2 -  Partial thickness loss of dermis presenting as a shallow open injury with a red, pink wound bed without slough.  Wound Description (Comments): 77mmx5mmx0 red center unattached edges  Present on Admission:   WOC RN consulted.    -   DVT prophylaxis: Eliquis resumed 11/15/2022.   Code Status: Full Family Communication: Updated patient, no family at bedside.   Disposition: TBD  Status is: Inpatient Remains inpatient appropriate because: Severity of illness   Consultants:  WOC RN: Ria Bush, RN 11/10/2022 Nephrology: Dr. Signe Colt 11/11/2022 Psychiatry: Dr. Gasper Sells 11/11/2022 Orthopedics: Dr. Lajoyce Corners 11/12/2022  Procedures:  Excisional debridement of lower extremity wound with excision skin and soft tissue muscle and fascia/application of wound VAC/application of Kerecis micro graft 95 cm per orthopedics: Dr. Lajoyce Corners 11/14/2022  Antimicrobials:  Anti-infectives (From admission, onward)    Start     Dose/Rate Route Frequency Ordered Stop   11/15/22 1800  ceFEPIme (MAXIPIME) 2 g in sodium chloride 0.9 % 100 mL IVPB        2 g 200 mL/hr over 30 Minutes Intravenous Every T-Th-Sa (1800) 11/14/22 1233     11/14/22 2300  vancomycin (VANCOCIN) IVPB 1000 mg/200 mL premix        1,000 mg 200 mL/hr over 60 Minutes Intravenous  Once 11/14/22 2207 11/15/22 0021   11/14/22 1500  vancomycin (VANCOCIN) IVPB 1000 mg/200 mL premix  Status:  Discontinued        1,000 mg 200 mL/hr over 60 Minutes Intravenous  Once 11/14/22 1433 11/14/22 2207   11/14/22 1400  metroNIDAZOLE  (FLAGYL) IVPB 500 mg        500 mg 100 mL/hr over 60 Minutes Intravenous Every 12 hours 11/14/22 1221     11/14/22 1400  ceFEPIme (MAXIPIME) 2 g in sodium chloride 0.9 % 100 mL IVPB        2 g 200 mL/hr over 30 Minutes Intravenous  Once 11/14/22 1233 11/15/22 1857   11/14/22 0923  vancomycin (VANCOCIN) powder  Status:  Discontinued          As needed 11/14/22 0924 11/14/22 0930   11/14/22 0745  vancomycin (VANCOCIN) IVPB 1000 mg/200 mL premix  Status:  Discontinued        1,000 mg 200 mL/hr over 60 Minutes Intravenous On call to O.R. 11/14/22 0631 11/14/22 1032   11/14/22 0730  levofloxacin (LEVAQUIN) IVPB 500 mg  Status:  Discontinued        500 mg 100 mL/hr over 60 Minutes Intravenous On call to O.R. 11/14/22 0631 11/14/22 1032   11/13/22 1730  fluconazole (DIFLUCAN) tablet 150 mg        150 mg Oral  Once 11/13/22 1630 11/13/22 1707   11/12/22 1145  vancomycin (VANCOCIN) IVPB 1000 mg/200 mL premix  Status:  Discontinued        1,000 mg 200 mL/hr over 60 Minutes Intravenous On call to O.R. 11/12/22  1052 11/12/22 1055   11/12/22 1145  levofloxacin (LEVAQUIN) IVPB 500 mg  Status:  Discontinued        500 mg 100 mL/hr over 60 Minutes Intravenous On call to O.R. 11/12/22 1052 11/12/22 1055   11/12/22 1054  vancomycin (VANCOCIN) 1-5 GM/200ML-% IVPB  Status:  Discontinued       Note to Pharmacy: [facility], [place]: cabinet override      11/12/22 1054 11/12/22 1114   11/12/22 0815  vancomycin (VANCOCIN) IVPB 1000 mg/200 mL premix        1,000 mg 200 mL/hr over 60 Minutes Intravenous  Once 11/12/22 0718 11/12/22 2040   11/11/22 1354  vancomycin (VANCOCIN) IVPB 1000 mg/200 mL premix        1,000 mg 200 mL/hr over 60 Minutes Intravenous Every T-Th-Sa (Hemodialysis) 11/11/22 1354     11/10/22 1700  piperacillin-tazobactam (ZOSYN) IVPB 2.25 g  Status:  Discontinued        2.25 g 100 mL/hr over 30 Minutes Intravenous Every 8 hours 11/10/22 1645 11/14/22 1221   11/10/22 1645  vancomycin (VANCOREADY)  IVPB 2000 mg/400 mL        2,000 mg 200 mL/hr over 120 Minutes Intravenous  Once 11/10/22 1632 11/11/22 1930         Subjective: Laying in bed.  Complaining of left lower extremity pain.  States was given some oxycodone overnight which made her very sleepy and drowsy with some associated dizziness.  Patient noted to have received 15 mg of oxycodone overnight.  Asking for diet to be liberalized to a regular diet.  Objective: Vitals:   11/15/22 1600 11/15/22 2004 11/16/22 0419 11/16/22 0818  BP: (!) 114/48 (!) 112/50 (!) 84/54 (!) 107/48  Pulse: 61 63 (!) 56 (!) 57  Resp:  17 17 18   Temp: 97.8 F (36.6 C) (!) 97.5 F (36.4 C)  97.7 F (36.5 C)  TempSrc:  Oral  Axillary  SpO2: 99% 99% 100% 100%  Weight:      Height:        Intake/Output Summary (Last 24 hours) at 11/16/2022 1155 Last data filed at 11/16/2022 0707 Gross per 24 hour  Intake 687.42 ml  Output 1350 ml  Net -662.58 ml    Filed Weights   11/11/22 1449 11/12/22 1056 11/15/22 0858  Weight: 94.4 kg 100 kg 100 kg    Examination:  General exam: NAD Respiratory system: CTAB.  No wheezes, no crackles, no rhonchi.  Fair air movement.  Speaking in full sentences.  Cardiovascular system: RRR no murmurs rubs or gallops.  No JVD.  No lower extremity edema. Gastrointestinal system: Abdomen is soft, nontender, nondistended, positive bowel sounds.  No rebound.  No guarding. Central nervous system: Alert and oriented. No focal neurological deficits. Extremities: Left lower extremity bandaged and wound VAC in place.  Status post right BKA. Skin: No rashes, lesions or ulcers Psychiatry: Judgement and insight appear normal. Mood & affect appropriate.     Data Reviewed: I have personally reviewed following labs and imaging studies  CBC: Recent Labs  Lab 11/10/22 1345 11/10/22 1649 11/12/22 0905 11/13/22 0052 11/14/22 0702 11/15/22 0415 11/16/22 0152  WBC 7.0   < > 5.3 5.8 5.8 4.8 5.3  NEUTROABS 5.5  --  3.7 4.4 3.8  3.9  --   HGB 11.0*   < > 9.0* 9.5* 9.0* 8.3* 8.5*  HCT 36.5   < > 29.8* 30.7* 29.3* 26.9* 27.0*  MCV 80.2   < > 77.8* 79.9* 80.5 77.3* 78.3*  PLT 125*   < > 127* 133* 151 157 159   < > = values in this interval not displayed.     Basic Metabolic Panel: Recent Labs  Lab 11/12/22 0905 11/13/22 0052 11/14/22 0702 11/15/22 0415 11/16/22 0152  NA 134* 133* 135 131* 132*  K 3.0* 3.5 3.6 3.5 3.5  CL 96* 96* 94* 94* 96*  CO2 26 25 22 23 26   GLUCOSE 335* 260* 84 461* 325*  BUN 51* 59* 89* 61* 42*  CREATININE 3.79* 4.58* 6.10* 4.33* 3.30*  CALCIUM 8.5* 8.5* 8.5* 8.5* 8.6*  PHOS 5.8* 6.7* 7.8* 5.9* 4.1     GFR: Estimated Creatinine Clearance: 17.8 mL/min (A) (by C-G formula based on SCr of 3.3 mg/dL (H)).  Liver Function Tests: Recent Labs  Lab 11/10/22 1345 11/12/22 0905 11/13/22 0052 11/14/22 0702 11/15/22 0415 11/16/22 0152  AST 30  --   --   --   --   --   ALT 19  --   --   --   --   --   ALKPHOS 201*  --   --   --   --   --   BILITOT 0.9  --   --   --   --   --   PROT 6.5  --   --   --   --   --   ALBUMIN 3.0* 2.2* 2.2* 2.2* 2.5* 2.6*     CBG: Recent Labs  Lab 11/15/22 1744 11/15/22 2115 11/16/22 0304 11/16/22 0740 11/16/22 1139  GLUCAP 267* 295* 296* 258* 238*      Recent Results (from the past 240 hour(s))  Blood Cultures x 2 sites     Status: None   Collection Time: 11/10/22 12:58 PM   Specimen: BLOOD RIGHT ARM  Result Value Ref Range Status   Specimen Description BLOOD RIGHT ARM  Final   Special Requests   Final    BOTTLES DRAWN AEROBIC AND ANAEROBIC Blood Culture results may not be optimal due to an inadequate volume of blood received in culture bottles   Culture   Final    NO GROWTH 5 DAYS Performed at [facility], [address] [place], [place] [address]    Report Status 11/15/2022 FINAL  Final  Blood Cultures x 2 sites     Status: None   Collection Time: 11/10/22  4:41 PM   Specimen: BLOOD RIGHT FOREARM  Result Value Ref Range  Status   Specimen Description BLOOD RIGHT FOREARM  Final   Special Requests   Final    BOTTLES DRAWN AEROBIC AND ANAEROBIC Blood Culture adequate volume   Culture   Final    NO GROWTH 5 DAYS Performed at [facility], [address], [place], [place] [address]    Report Status 11/15/2022 FINAL  Final  Surgical pcr screen     Status: Abnormal   Collection Time: 11/11/22 11:31 PM   Specimen: Nasal Mucosa; Nasal Swab  Result Value Ref Range Status   MRSA, PCR NEGATIVE NEGATIVE Final   Staphylococcus aureus POSITIVE (A) NEGATIVE Final    Comment: (NOTE) The Xpert SA Assay (FDA approved for NASAL specimens in patients 57 years of age and older), is one component of a comprehensive surveillance program. It is not intended to diagnose infection nor to guide or monitor treatment. Performed at [facility], [address], [place], [place] [address]   Aerobic/Anaerobic Culture w Gram Stain (surgical/deep wound)  Status: None (Preliminary result)   Collection Time: 11/14/22  9:19 AM   Specimen: Soft Tissue, Other  Result Value Ref Range Status   Specimen Description TISSUE  Final   Special Requests NONE  Final   Gram Stain   Final    MODERATE WBC PRESENT,BOTH PMN AND MONONUCLEAR RARE SQUAMOUS EPITHELIAL CELLS PRESENT NO ORGANISMS SEEN    Culture   Final    FEW PROTEUS MIRABILIS RARE KLEBSIELLA PNEUMONIAE CULTURE REINCUBATED FOR BETTER GROWTH Performed at [facility], [address], [place], [place] [address]    Report Status PENDING  Incomplete         Radiology Studies: No results found.      Scheduled Meds:  (feeding supplement) PROSource Plus  30 mL Oral BID BM   acetaminophen  1,000 mg Oral TID   amiodarone  200 mg Oral Daily   apixaban  5 mg Oral BID   ascorbic acid  500 mg Oral Daily   atorvastatin  80 mg Oral Daily   Chlorhexidine Gluconate Cloth  6 each Topical Q0600   Chlorhexidine Gluconate Cloth  6 each Topical Daily    clotrimazole  1 Applicatorful Vaginal QHS   feeding supplement (NEPRO CARB STEADY)  237 mL Oral Daily   folic acid  1 mg Oral Daily   insulin aspart  0-5 Units Subcutaneous QHS   insulin aspart  0-6 Units Subcutaneous TID WC   insulin glargine-yfgn  40 Units Subcutaneous BID   levothyroxine  75 mcg Oral QAC breakfast   LORazepam  0.5 mg Oral QHS   midodrine  10 mg Oral Q T,Th,Sa-HD   mupirocin ointment  1 Application Nasal BID   pantoprazole  40 mg Oral BID AC   polyethylene glycol  17 g Oral Daily   pregabalin  150 mg Oral BID   ramelteon  8 mg Oral QHS   senna-docusate  2 tablet Oral BID   sevelamer carbonate  1,600 mg Oral TID WC   sodium hypochlorite   Irrigation BID   venlafaxine XR  75 mg Oral Q breakfast   vortioxetine HBr  10 mg Oral Daily   zinc sulfate  220 mg Oral Daily   Continuous Infusions:  sodium chloride     albumin human 25 g (11/15/22 0940)   ceFEPime (MAXIPIME) IV 2 g (11/15/22 2307)   methocarbamol (ROBAXIN) IV     metronidazole 500 mg (11/16/22 0229)   vancomycin Stopped (11/15/22 1336)     LOS: 6 days    Time spent: 35 minutes    Ramiro Harvest, MD [facility]   To contact the attending provider between 7A-7P or the covering provider during after hours 7P-7A, please log into the web site [email or web address] and access using universal [place] password for that web site. If you do not have the password, please call the hospital operator.  11/16/2022, 11:55 AM

## 2022-11-16 NOTE — Progress Notes (Signed)
Patient's CBG at 0300 is 296 mg/dl. Manuela Schwartz, NP notified

## 2022-11-17 ENCOUNTER — Inpatient Hospital Stay (HOSPITAL_COMMUNITY): Payer: PPO

## 2022-11-17 DIAGNOSIS — B871 Wound myiasis: Secondary | ICD-10-CM | POA: Diagnosis not present

## 2022-11-17 DIAGNOSIS — S81802D Unspecified open wound, left lower leg, subsequent encounter: Secondary | ICD-10-CM | POA: Diagnosis not present

## 2022-11-17 DIAGNOSIS — E1142 Type 2 diabetes mellitus with diabetic polyneuropathy: Secondary | ICD-10-CM | POA: Diagnosis not present

## 2022-11-17 DIAGNOSIS — T148XXA Other injury of unspecified body region, initial encounter: Secondary | ICD-10-CM | POA: Diagnosis not present

## 2022-11-17 DIAGNOSIS — N186 End stage renal disease: Secondary | ICD-10-CM | POA: Diagnosis not present

## 2022-11-17 DIAGNOSIS — I952 Hypotension due to drugs: Secondary | ICD-10-CM | POA: Diagnosis not present

## 2022-11-17 DIAGNOSIS — Z89511 Acquired absence of right leg below knee: Secondary | ICD-10-CM | POA: Diagnosis not present

## 2022-11-17 LAB — RENAL FUNCTION PANEL
Albumin: 2.8 g/dL — ABNORMAL LOW (ref 3.5–5.0)
Anion gap: 14 (ref 5–15)
BUN: 60 mg/dL — ABNORMAL HIGH (ref 8–23)
CO2: 21 mmol/L — ABNORMAL LOW (ref 22–32)
Calcium: 9 mg/dL (ref 8.9–10.3)
Chloride: 98 mmol/L (ref 98–111)
Creatinine, Ser: 4.84 mg/dL — ABNORMAL HIGH (ref 0.44–1.00)
GFR, Estimated: 9 mL/min — ABNORMAL LOW (ref >60)
Glucose, Bld: 80 mg/dL (ref 70–99)
Phosphorus: 6.4 mg/dL — ABNORMAL HIGH (ref 2.5–4.6)
Potassium: 3.9 mmol/L (ref 3.5–5.1)
Sodium: 133 mmol/L — ABNORMAL LOW (ref 135–145)

## 2022-11-17 LAB — CBC WITH DIFFERENTIAL/PLATELET
Abs Immature Granulocytes: 0.02 10*3/uL (ref 0.00–0.07)
Basophils Absolute: 0.1 10*3/uL (ref 0.0–0.1)
Basophils Relative: 1 %
Eosinophils Absolute: 0.3 10*3/uL (ref 0.0–0.5)
Eosinophils Relative: 6 %
HCT: 30.3 % — ABNORMAL LOW (ref 36.0–46.0)
Hemoglobin: 9.1 g/dL — ABNORMAL LOW (ref 12.0–15.0)
Immature Granulocytes: 0 %
Lymphocytes Relative: 15 %
Lymphs Abs: 0.8 10*3/uL (ref 0.7–4.0)
MCH: 24.5 pg — ABNORMAL LOW (ref 26.0–34.0)
MCHC: 30 g/dL (ref 30.0–36.0)
MCV: 81.5 fL (ref 80.0–100.0)
Monocytes Absolute: 0.6 10*3/uL (ref 0.1–1.0)
Monocytes Relative: 11 %
Neutro Abs: 3.7 10*3/uL (ref 1.7–7.7)
Neutrophils Relative %: 67 %
Platelets: 171 10*3/uL (ref 150–400)
RBC: 3.72 MIL/uL — ABNORMAL LOW (ref 3.87–5.11)
RDW: 16.6 % — ABNORMAL HIGH (ref 11.5–15.5)
WBC: 5.5 10*3/uL (ref 4.0–10.5)
nRBC: 0 % (ref 0.0–0.2)

## 2022-11-17 LAB — GLUCOSE, CAPILLARY
Glucose-Capillary: 147 mg/dL — ABNORMAL HIGH (ref 70–99)
Glucose-Capillary: 73 mg/dL (ref 70–99)
Glucose-Capillary: 121 mg/dL — ABNORMAL HIGH (ref 70–99)
Glucose-Capillary: 112 mg/dL — ABNORMAL HIGH (ref 70–99)
Glucose-Capillary: 93 mg/dL (ref 70–99)
Glucose-Capillary: 79 mg/dL (ref 70–99)
Glucose-Capillary: 75 mg/dL (ref 70–99)

## 2022-11-17 LAB — AEROBIC/ANAEROBIC CULTURE W GRAM STAIN (SURGICAL/DEEP WOUND)

## 2022-11-17 MED ORDER — CEFAZOLIN SODIUM-DEXTROSE 2-4 GM/100ML-% IV SOLN
2.0000 g | INTRAVENOUS | Status: DC
  Administered 2022-11-18: 2 g via INTRAVENOUS
  Filled 2022-11-17: qty 100

## 2022-11-17 MED ORDER — CEFAZOLIN IV (FOR PTA / DISCHARGE USE ONLY): 2.0000 g | INTRAVENOUS | 0 refills | Status: DC

## 2022-11-17 MED ORDER — IPRATROPIUM-ALBUTEROL 0.5-2.5 (3) MG/3ML IN SOLN
3.0000 mL | Freq: Three times a day (TID) | RESPIRATORY_TRACT | Status: DC
  Administered 2022-11-17: 3 mL via RESPIRATORY_TRACT
  Filled 2022-11-17 (×2): qty 3

## 2022-11-17 MED ORDER — INSULIN GLARGINE-YFGN 100 UNIT/ML ~~LOC~~ SOLN
40.0000 [IU] | Freq: Every day | SUBCUTANEOUS | Status: DC
  Administered 2022-11-18: 40 [IU] via SUBCUTANEOUS
  Filled 2022-11-17 (×2): qty 0.4

## 2022-11-17 MED ORDER — INSULIN GLARGINE-YFGN 100 UNIT/ML ~~LOC~~ SOLN
30.0000 [IU] | Freq: Every day | SUBCUTANEOUS | Status: DC
  Filled 2022-11-17 (×2): qty 0.3

## 2022-11-17 MED ORDER — ALUM & MAG HYDROXIDE-SIMETH 200-200-20 MG/5ML PO SUSP
30.0000 mL | Freq: Once | ORAL | Status: AC
  Administered 2022-11-17: 30 mL via ORAL
  Filled 2022-11-17: qty 30

## 2022-11-17 MED ORDER — LIDOCAINE VISCOUS HCL 2 % MT SOLN
15.0000 mL | Freq: Once | OROMUCOSAL | Status: AC
  Administered 2022-11-17: 15 mL via ORAL
  Filled 2022-11-17: qty 15

## 2022-11-17 MED ORDER — CEFAZOLIN SODIUM-DEXTROSE 1-4 GM/50ML-% IV SOLN
1.0000 g | Freq: Once | INTRAVENOUS | Status: AC
  Administered 2022-11-17: 1 g via INTRAVENOUS
  Filled 2022-11-17: qty 50

## 2022-11-17 MED ORDER — NEPRO/CARBSTEADY PO LIQD
237.0000 mL | Freq: Two times a day (BID) | ORAL | Status: DC
  Administered 2022-11-18: 237 mL via ORAL

## 2022-11-17 MED ORDER — RENA-VITE PO TABS
1.0000 | ORAL_TABLET | Freq: Every day | ORAL | Status: DC
  Administered 2022-11-17 – 2022-11-18 (×2): 1 via ORAL
  Filled 2022-11-17 (×2): qty 1

## 2022-11-17 NOTE — Progress Notes (Addendum)
Patient ID: Susan [name], female   DOB: July 23, 1950, 72 y.o.   MRN: [id number]   Patient is seen in follow-up status post debridement left leg wound.  There is 300 cc in the wound VAC canister.  Cultures are showing Klebsiella and Proteus.  Klebsiella cultures are pending.  Anticipate patient can discharge on oral antibiotics once the cultures are finalized.  Plan to discharge with the Praveena plus portable wound VAC pump.

## 2022-11-17 NOTE — Progress Notes (Signed)
PHARMACY CONSULT NOTE FOR:  OUTPATIENT  PARENTERAL ANTIBIOTIC THERAPY (OPAT)  Informational as the patient will receive antibiotics outpatient at the HD center  Indication: Proteus mirabilis + Kleb PNA wound infection Regimen: Cefazolin 2g/HD-TTS End date: 11/28/22  IV antibiotic discharge orders are pended. To discharging provider:  please sign these orders via discharge navigator,  Select New Orders & click on the button choice - Manage This Unsigned Work.     Thank you for allowing pharmacy to be a part of this patient's care.  Georgina Pillion, PharmD, BCPS, BCIDP Infectious Diseases Clinical Pharmacist 11/17/2022 11:29 AM   **Pharmacist phone directory can now be found on [email or web address] ([email or web address]).  Listed under [facility].

## 2022-11-17 NOTE — Progress Notes (Signed)
[facility] Progress Note   Subjective:  Seen in room. Not feeling good this morning. BS low for her. Got dizzy after breakfast, having  Objective Vitals:   11/16/22 1345 11/16/22 2009 11/17/22 0444 11/17/22 0736  BP: (!) 107/54 (!) 117/54 (!) 114/53 (!) 111/50  Pulse: 62 63 63 86  Resp: 18 16 17 18   Temp: 98.1 F (36.7 C) 97.6 F (36.4 C)  98.4 F (36.9 C)  TempSrc: Oral Oral  Oral  SpO2: 99% 100% 100% 100%  Weight:      Height:       Physical Exam General: chronically ill appearing female in NAD Heart: RRR, no mrg Lungs: CTAB, nml WOB on RA Abdomen:soft, NTND Extremities: L leg dressed, wound vac in place, 1+ edema in hips, R BKA Dialysis Access: R TDC, L AVF no bruit   Filed Weights   11/11/22 1449 11/12/22 1056 11/15/22 0858  Weight: 94.4 kg 100 kg 100 kg    Intake/Output Summary (Last 24 hours) at 11/17/2022 1035 Last data filed at 11/17/2022 0600 Gross per 24 hour  Intake --  Output 40 ml  Net -40 ml     Additional Objective Labs: Basic Metabolic Panel: Recent Labs  Lab 11/15/22 0415 11/16/22 0152 11/17/22 0739  NA 131* 132* 133*  K 3.5 3.5 3.9  CL 94* 96* 98  CO2 23 26 21*  GLUCOSE 461* 325* 80  BUN 61* 42* 60*  CREATININE 4.33* 3.30* 4.84*  CALCIUM 8.5* 8.6* 9.0  PHOS 5.9* 4.1 6.4*    Liver Function Tests: Recent Labs  Lab 11/10/22 1345 11/12/22 0905 11/15/22 0415 11/16/22 0152 11/17/22 0739  AST 30  --   --   --   --   ALT 19  --   --   --   --   ALKPHOS 201*  --   --   --   --   BILITOT 0.9  --   --   --   --   PROT 6.5  --   --   --   --   ALBUMIN 3.0*   < > 2.5* 2.6* 2.8*   < > = values in this interval not displayed.     CBC: Recent Labs  Lab 11/13/22 0052 11/14/22 0702 11/15/22 0415 11/16/22 0152 11/17/22 0739  WBC 5.8 5.8 4.8 5.3 5.5  NEUTROABS 4.4 3.8 3.9  --  3.7  HGB 9.5* 9.0* 8.3* 8.5* 9.1*  HCT 30.7* 29.3* 26.9* 27.0* 30.3*  MCV 79.9* 80.5 77.3* 78.3* 81.5  PLT 133* 151 157 159 171     CBG: Recent Labs  Lab 11/16/22 1709 11/16/22 2153 11/17/22 0306 11/17/22 0734 11/17/22 0938  GLUCAP 194* 210* 147* 73 121*     Medications:  sodium chloride     albumin human 25 g (11/15/22 0940)   ceFEPime (MAXIPIME) IV 2 g (11/15/22 2307)   methocarbamol (ROBAXIN) IV     metronidazole 500 mg (11/17/22 0445)    (feeding supplement) PROSource Plus  30 mL Oral BID BM   acetaminophen  1,000 mg Oral TID   amiodarone  200 mg Oral Daily   apixaban  5 mg Oral BID   ascorbic acid  500 mg Oral Daily   atorvastatin  80 mg Oral Daily   Chlorhexidine Gluconate Cloth  6 each Topical Q0600   clotrimazole  1 Applicatorful Vaginal QHS   feeding supplement (NEPRO CARB STEADY)  237 mL Oral Daily   folic acid  1 mg Oral  Daily   insulin aspart  0-5 Units Subcutaneous QHS   insulin aspart  0-6 Units Subcutaneous TID WC   insulin glargine-yfgn  40 Units Subcutaneous BID   levothyroxine  75 mcg Oral QAC breakfast   LORazepam  0.5 mg Oral QHS   midodrine  10 mg Oral Q T,Th,Sa-HD   pantoprazole  40 mg Oral BID AC   polyethylene glycol  17 g Oral Daily   pregabalin  150 mg Oral BID   ramelteon  8 mg Oral QHS   senna-docusate  2 tablet Oral BID   sevelamer carbonate  1,600 mg Oral TID WC   sodium hypochlorite   Irrigation BID   venlafaxine XR  75 mg Oral Q breakfast   zinc sulfate  220 mg Oral Daily    Dialysis Orders: [facility] TTS 4:00 400/800 EDW 91.5kg 2K/2Ca TDC Heparin 4000 -Mircera 75  - last 6/8 -Calcitriol 0.5 three times per week     Assessment/Plan: Infected left lower extremity wound - maggot infestation on admission. Blood cultures neg to date. IV  cefepime/Vanc per primary. Ortho following - s/p OR debridement on 6/21, now with wound vac ESRD -  HD TTS. Next HD on 6/25. 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 For access surgery next month  Hypertension/volume  - BP controlled. Does have some volume on exam. UF as able.  Anemia  - Hgb 8.5. ESA due - will write order to give  today.  Metabolic bone disease -  Ca acceptable. Phos above goal. Continue home binders with diet.  Nutrition - Renal diet with fluid restriction.  T2DM - poorly controlled. Insulin per primary  Tomasa Blase PA-C [facility] 11/17/2022,10:36 AM

## 2022-11-17 NOTE — Consult Note (Signed)
[facility]    Date of Admission:  11/10/2022          Reason for Consult: LLE wound    Principal Problem:   Complicated wound infection Active Problems:   Hyperlipemia   Hypotension   Impaired ambulation   Hypothyroidism   Prolonged QT interval   Type 2 diabetes mellitus with diabetic polyneuropathy, with long-term current use of insulin (HCC)   Obesity, Class III, BMI 40-49.9 (morbid obesity) (HCC)   Paroxysmal atrial fibrillation (HCC)   S/P BKA (below knee amputation) unilateral, right (HCC)   ESRD on dialysis (HCC)   Maggot infestation   Cellulitis of left lower extremity   Assessment: 72 year old female admitted with:  #Left leg wound found to have maggots #Hx of PAD c/b RLE ulcer SP BKA - Patient has been seen Dr. Lajoyce Corners for left lower extremity ulcer which she developed in May.  She had undergone conservative wound care for venous insufficiency ulcer secondary to pressure.  She was last seen on 6/17 where maggots were noted in the wound.  No maggots noted on prior visit on 6/10.  She said that/1 on her leg and acutely developed these organisms.  She was taken to the OR on 6/17 with Dr. Lajoyce Corners and underwent soft tissue and fascia debridement. - OR cultures grew Proteus mirabilis Klebsiella pneumoniae  #Cefadroxil intolerance - I discussed with patient that diarrhea is a side effect of antibiotics.  She noted that diarrhea started on Keflex after she had been on it for 2 weeks.  She states she is willing to try cefazolin.  Recommendations:  Start cefazolin to complete 2 weeks of antibiotics in OR(Lab was called and cefazolin sensitive) EOT 7/6.  Can be given with hemodialysis - Follow-up with infectious disease myself on 7/30 - ID will sign off Microbiology:   Antibiotics: Zosyn 6/19 - 6/20 Vancomycin 6/17-present Metronidazole 6/21-23. - Cefepime 6/22  Cultures: Blood 6/17 no growth Urine  Other 6/21 Proteus mirabilis and  Klebsiella pneumo  HPI: Susan [name] is a 72 y.o. female past medical history of CAD's, CHF, HLD, ESRD on HD, hypertension, PVD complicated by right lower extremity ulcer status post right BKA on 04/2022, revision on 06/14/2022, bleeding rectal ulcers postrepair in OR 05/24/2022, diabetes mellitus presented with maggot infestation of her left lower extremity.  She has not had a fistula that she was assessed using TDC.  About a month ago she had acute onset of left leg ulcer.  Seen by Dr. Lajoyce Corners on 5/13 was given compressive wrap.  Noted to have maggots in the left extremity wound on 6/17.  Consulted with the ED for further management.  Taken to the OR on 6/21 and underwent debridement of soft tissue and muscle fascia.  Or cultures polymicrobial.  ID engaged for antibiotic recommendations.   Review of Systems: Review of Systems  All other systems reviewed and are negative.   Past Medical History:  Diagnosis Date   Anemia    hx   Anxiety    Arthritis    "generalized" (03/15/2014)   CAD (coronary artery disease)    MI in 2000 - MI  2007 - treated bare metal stent (no nuclear since then as 9/11)   Carotid artery disease (HCC)    Chronic diastolic heart failure (HCC)    a) ECHO (08/2013) EF 55-60% and RV function nl b) RHC (08/2013) RA 4, RV 30/5/7, PA 25/10 (16), PCWP 7, Fick CO/CI 6.3/2.7,  PVR 1.5 WU, PA 61 and 66%   Daily headache    "~ every other day; since I fell in June" (03/15/2014)   Depression    Dyslipidemia    ESRD (end stage renal disease) (HCC)    Dialysis on Tues Thurs Sat   Exertional shortness of breath    History of kidney stones    HTN (hypertension)    Hypothyroidism    Obesity    Osteoarthritis    PAF (paroxysmal atrial fibrillation) (HCC)    Peripheral neuropathy    bilateral feet/hands   PONV (postoperative nausea and vomiting)    RBBB (right bundle branch block)    Old   Stroke (HCC)    mini strokes   Type II diabetes mellitus (HCC)    Type II, [name]  libre left upper arm. patient has omnipod insulin pump with Novolin R Insulin    Social History   Tobacco Use   Smoking status: Former    Packs/day: 3.00    Years: 32.00    Additional pack years: 0.00    Total pack years: 96.00    Types: Cigarettes    Quit date: 10/24/1997    Years since quitting: 25.0   Smokeless tobacco: Never  Vaping Use   Vaping Use: Never used  Substance Use Topics   Alcohol use: Not Currently    Comment: "might have 2-3 daiquiris in the summer"   Drug use: No    Family History  Problem Relation Age of Onset   Heart attack Mother 66   Scheduled Meds:  (feeding supplement) PROSource Plus  30 mL Oral BID BM   acetaminophen  1,000 mg Oral TID   amiodarone  200 mg Oral Daily   apixaban  5 mg Oral BID   ascorbic acid  500 mg Oral Daily   atorvastatin  80 mg Oral Daily   Chlorhexidine Gluconate Cloth  6 each Topical Q0600   clotrimazole  1 Applicatorful Vaginal QHS   feeding supplement (NEPRO CARB STEADY)  237 mL Oral Daily   folic acid  1 mg Oral Daily   insulin aspart  0-5 Units Subcutaneous QHS   insulin aspart  0-6 Units Subcutaneous TID WC   insulin glargine-yfgn  40 Units Subcutaneous BID   levothyroxine  75 mcg Oral QAC breakfast   LORazepam  0.5 mg Oral QHS   midodrine  10 mg Oral Q T,Th,Sa-HD   pantoprazole  40 mg Oral BID AC   polyethylene glycol  17 g Oral Daily   pregabalin  150 mg Oral BID   ramelteon  8 mg Oral QHS   senna-docusate  2 tablet Oral BID   sevelamer carbonate  1,600 mg Oral TID WC   sodium hypochlorite   Irrigation BID   venlafaxine XR  75 mg Oral Q breakfast   zinc sulfate  220 mg Oral Daily   Continuous Infusions:  sodium chloride     albumin human 25 g (11/15/22 0940)   [START ON 11/18/2022]  ceFAZolin (ANCEF) IV     methocarbamol (ROBAXIN) IV     PRN Meds:.acetaminophen **OR** acetaminophen, acetaminophen, albumin human, alum & mag hydroxide-simeth, bisacodyl, bisacodyl, calcium carbonate (dosed in mg elemental  calcium), camphor-menthol **AND** hydrOXYzine, docusate sodium, hydrALAZINE, HYDROmorphone (DILAUDID) injection, magnesium citrate, methocarbamol **OR** methocarbamol (ROBAXIN) IV, metoCLOPramide **OR** metoCLOPramide (REGLAN) injection, oxyCODONE, polyethylene glycol, sorbitol, zolpidem Allergies  Allergen Reactions   Cephalexin Diarrhea and Other (See Comments)   Codeine Nausea And Vomiting and Other (See Comments)  OBJECTIVE: Blood pressure (!) 119/56, pulse 69, temperature 97.7 F (36.5 C), resp. rate 18, height 5\' 5"  (1.651 m), weight 100 kg, SpO2 99 %.  Physical Exam Constitutional:      Appearance: Normal appearance.  HENT:     Head: Normocephalic and atraumatic.     Right Ear: Tympanic membrane normal.     Left Ear: Tympanic membrane normal.     Nose: Nose normal.     Mouth/Throat:     Mouth: Mucous membranes are moist.  Eyes:     Extraocular Movements: Extraocular movements intact.     Conjunctiva/sclera: Conjunctivae normal.     Pupils: Pupils are equal, round, and reactive to light.  Cardiovascular:     Rate and Rhythm: Normal rate and regular rhythm.     Heart sounds: No murmur heard.    No friction rub. No gallop.  Pulmonary:     Effort: Pulmonary effort is normal.     Breath sounds: Normal breath sounds.  Abdominal:     General: Abdomen is flat.     Palpations: Abdomen is soft.  Musculoskeletal:     Comments: Left leg bandaged with vac  Skin:    General: Skin is warm and dry.  Neurological:     General: No focal deficit present.     Mental Status: She is alert and oriented to person, place, and time.  Psychiatric:        Mood and Affect: Mood normal.     Lab Results Lab Results  Component Value Date   WBC 5.5 11/17/2022   HGB 9.1 (L) 11/17/2022   HCT 30.3 (L) 11/17/2022   MCV 81.5 11/17/2022   PLT 171 11/17/2022    Lab Results  Component Value Date   CREATININE 4.84 (H) 11/17/2022   BUN 60 (H) 11/17/2022   NA 133 (L) 11/17/2022   K 3.9  11/17/2022   CL 98 11/17/2022   CO2 21 (L) 11/17/2022    Lab Results  Component Value Date   ALT 19 11/10/2022   AST 30 11/10/2022   GGT 144 (H) 02/01/2022   ALKPHOS 201 (H) 11/10/2022   BILITOT 0.9 11/10/2022       Danelle Earthly, MD [facility] 11/17/2022, 12:56 PM   I have personally spent 85 minutes involved in face-to-face and non-face-to-face activities for this patient on the day of the visit. Professional time spent includes the following activities: Preparing to see the patient (review of tests), Obtaining and/or reviewing separately obtained history (admission/discharge record), Performing a medically appropriate examination and/or evaluation , Ordering medications/tests/procedures, referring and communicating with other health care professionals, Documenting clinical information in the EMR, Independently interpreting results (not separately reported), Communicating results to the patient/family/caregiver, Counseling and educating the patient/family/caregiver and Care coordination (not separately reported).

## 2022-11-17 NOTE — Progress Notes (Signed)
Nutrition Follow-up  DOCUMENTATION CODES:   Obesity unspecified, Morbid obesity  INTERVENTION:  Increase Nepro Shake po to BID, each supplement provides 425 kcal and 19 grams protein  30 ml ProSource Plus BID, each supplement provides 100 kcals and 15 grams protein.   Renal MVI with minerals daily  Conitnue Vitamin C and Zinc to support wound healing  NUTRITION DIAGNOSIS:   Increased nutrient needs related to wound healing as evidenced by estimated needs. - remains applicable  GOAL:   Patient will meet greater than or equal to 90% of their needs - progressing  MONITOR:   PO intake, Supplement acceptance, Labs, Weight trends, Diet advancement, Skin, I & O's  REASON FOR ASSESSMENT:   Consult Wound healing  ASSESSMENT:   Pt admitted with ongoing L leg wound. PMH significant for CAD, chronic diastolic CHF, HLD, ESRD on HD, HTN, PVD s/p R BKA, hypothyroidism, morbid obesity, afib and DM.  6/21 - s/p debridement of LLE wound + wound VAC  Last HD 6/22 UF 1.2L Next HD tomorrow  Spoke with pt and her Susan [name] at bedside. She has a [employer]fil[employer] sandwich on bedside table however she had not eaten any at time of visit. She plans to but states that she feels hungry and has an appetite but receives food then does not eat it. On average she is eating about 2 meals per day during admission but states that she may only eat about 50% of some meals. She feels that her intake has improved some since her diet has been advanced to regular. She does not recall receiving Nepro during admission but states that she has received some ProSource.   Meal completions: 100% x breakfast and lunch today  Last weight (6/22) 100 kg  Medications: Vitamin C, folvite, SSI 0-5 units at bedtime, SSI 0-6 units TID, semglee 40 units BID, protonix, miralax, senna, renvela, zinc, IV abx  Labs: sodium 133, BUN 60, Cr 4.84, phos 6.4, GFR 9, CBG's 73-238 x24 hours  L leg wound VAC: 90ml x24  hours  NUTRITION - FOCUSED PHYSICAL EXAM:  Flowsheet Row Most Recent Value  Orbital Region No depletion  Upper Arm Region No depletion  Thoracic and Lumbar Region No depletion  Buccal Region No depletion  Temple Region No depletion  Clavicle Bone Region No depletion  Clavicle and Acromion Bone Region No depletion  Scapular Bone Region Moderate depletion  Patellar Region Unable to assess  Anterior Thigh Region Unable to assess  Posterior Calf Region Unable to assess  Edema (RD Assessment) None  Hair Reviewed  Eyes Reviewed  Mouth Reviewed  Skin Reviewed  Nails Other (Comment)  [discoloration]        Diet Order:   Diet Order             Diet regular Room service appropriate? Yes; Fluid consistency: Thin; Fluid restriction: 1500 mL Fluid  Diet effective now                   EDUCATION NEEDS:   No education needs have been identified at this time  Skin:  Skin Assessment: Skin Integrity Issues: Skin Integrity Issues:: Wound VAC Stage II: Per WOC: partial thickness Stage 2 pressure injury to the sacrum, 2cm x 3cm and superficial, depth is 0.1cm Wound Vac: LLE Diabetic Ulcer: LLE  Last BM:  6/22 (type 1 small)  Height:   Ht Readings from Last 1 Encounters:  11/12/22 5\' 5"  (1.651 m)    Weight:   Wt Readings from Last  1 Encounters:  11/15/22 100 kg    Ideal Body Weight:  53.1 kg  BMI:  Body mass index is 36.69 kg/m.  Estimated Nutritional Needs:   Kcal:  1500-1700  Protein:  80-95g  Fluid:  1L + UOP  Drusilla Kanner, RDN, LDN Clinical Nutrition

## 2022-11-17 NOTE — Plan of Care (Signed)
  Problem: Education: Goal: Knowledge of General Education information will improve Description: Including pain rating scale, medication(s)/side effects and non-pharmacologic comfort measures Outcome: Progressing   Problem: Activity: Goal: Risk for activity intolerance will decrease Outcome: Progressing   Problem: Elimination: Goal: Will not experience complications related to bowel motility Outcome: Progressing   Problem: Pain Managment: Goal: General experience of comfort will improve Outcome: Progressing   

## 2022-11-17 NOTE — Progress Notes (Signed)
PROGRESS NOTE    Susan [name]  BMW:[id number] DOB: 1950/07/04 DOA: 11/10/2022 PCP: Donita Brooks, MD    No chief complaint on file.   Brief Narrative:  72yo female with h/o CAD, chronic diastolic CHF, HLD, TTS HD, HTN, PVD s/p R BKA, hypothyroidism, morbid obesity, afib, and DM presenting with maggot infestation of her LLE. Dr. Lajoyce Corners sent her to the ER and plans for surgical debridement later this week. She was started on Vanc/Zosyn.    Assessment & Plan:   Principal Problem:   Complicated wound infection Active Problems:   Type 2 diabetes mellitus with diabetic polyneuropathy, with long-term current use of insulin (HCC)   ESRD on dialysis (HCC)   Paroxysmal atrial fibrillation (HCC)   Hyperlipemia   Hypotension   Impaired ambulation   Hypothyroidism   Prolonged QT interval   Obesity, Class III, BMI 40-49.9 (morbid obesity) (HCC)   S/P BKA (below knee amputation) unilateral, right (HCC)   Maggot infestation   Cellulitis of left lower extremity  #1 left lower extremity wound infection/necrotic wound lateral left leg with maggots -Patient noted to have failed conservative outpatient therapy and recommended for admission by orthopedics, Dr. Lajoyce Corners. -Patient seen in consultation by Dr. Lajoyce Corners and plan for debridement of the wound and application of Kerecis tissue graft and a cleanse choice Prevena wound VAC dressing. -Patient was to have debridement done on 11/12/2022 however procedure canceled and postponed till, 11/14/2022. -Patient seemed somewhat hesitant to take pain medications at this time and as such patient placed on scheduled Tylenol 1000 mg 3 times daily.   -Patient status post excisional debridement of lower extremity with application of wound VAC, 11/14/2022, cultures sent and growing Proteus mirabilis and Klebsiella pneumoniae.  -Patient with complaints of left lower extremity pain, however hesitant to take oxycodone as she states makes her very drowsy and  sleepy. -Patient noted to have received oxycodone 73 mg on 11/15/2018 4 in the evening. -Decreased dose of oxycodone 5 mg every 4 hours as needed more tolerable to patient and patient less drowsy.   -Patient was on IV Vanco, IV cefepime, IV Flagyl.   -IV vancomycin discontinued 11/16/2022.   -Patient seen in consultation by ID and IV antibiotics narrowed to IV cefazolin with end date 11/29/2022.   -ID and orthopedics following and appreciate input and recommendations.   2.  ESRD on HD TTS -Patient noted with thrombosed aVF felt not salvageable per nephrology and patient for planned outpatient follow-up in 3 to 4 weeks. -Nephrology consulted and following.  3.  Diabetes mellitus type 2 -Hemoglobin A1c 7.9 (08/27/2022). -CBG 73 this morning and patient with complaints of dizziness. -Continue Semglee 40 units in the morning decrease nighttime dose to 30 units.  SSI.   4.  Atrial fibrillation -Continue amiodarone.   -Eliquis for anticoagulation.    5.  History of right renal infarct, splenic infarct -In the setting of underlying A-fib. -Eliquis held early on during the hospitalization in anticipation of excisional debridement and resumed, 11/15/2022.   6.  Hypothyroidism -Synthroid.    7.  Hypotension -Continue midodrine.    8.  PVD status post right BKA -Continue statin.   -Eliquis was held for excisional debridement and resumed 11/15/2022.    9.  Morbid obesity -BMI 41.77 -Lifestyle modification. -Outpatient follow-up with PCP.  10.  Ambulatory dysfunction -Patient noted unable to bear weight to assist with transfers since BKA. -Being fitted for prosthesis. -PT/OT has assessed patient recommending CIR. -Patient being assessed by CIR.Marland Kitchen  11.  Prolonged QTc -Repeat EKG with resolution of QTc prolongation.   12.  MDD/GAD with panic  -Patient seems to have a brighter affect today and more interactive.   - Patient seen in consultation by psychiatry -Patient currently on  vortioxetine and been transitioned to venlafaxine per psychiatry. -Patient feels new antidepressant medication working much better than prior one. -Psychiatry was following but have signed off as of 11/14/2022.   13.  Constipation -Patient with complaints of constipation on 11/15/2022, was placed on bowel regimen given sorbitol and Dulcolax suppositories and have been multiple bowel movements. -Continue current bowel regimen of MiraLAX daily, Senokot-S twice daily.  14.  Dizziness -Patient noted to have CBG of 73 this morning. -Low CBG likely etiology of dizziness. -Check orthostatics supine and sitting. -Will likely need to decrease night dose of long-acting insulin.  14.  Stage II pressure injury of the sacrum, POA Pressure Injury 08/27/22 Coccyx Mid Stage 2 -  Partial thickness loss of dermis presenting as a shallow open injury with a red, pink wound bed without slough. 1mmx5mmx0 red center unattached edges (Active)  08/27/22 1000  Location: Coccyx  Location Orientation: Mid  Staging: Stage 2 -  Partial thickness loss of dermis presenting as a shallow open injury with a red, pink wound bed without slough.  Wound Description (Comments): 59mmx5mmx0 red center unattached edges  Present on Admission:   WOC RN consulted.    -   DVT prophylaxis: Eliquis resumed 11/15/2022.   Code Status: Full Family Communication: Updated patient, no family at bedside.   Disposition: TBD  Status is: Inpatient Remains inpatient appropriate because: Severity of illness   Consultants:  WOC RN: Ria Bush, RN 11/10/2022 Nephrology: Dr. Signe Colt 11/11/2022 Psychiatry: Dr. Gasper Sells 11/11/2022 Orthopedics: Dr. Lajoyce Corners 11/12/2022 ID: Dr. Thedore Mins 11/17/2022  Procedures:  Excisional debridement of lower extremity wound with excision skin and soft tissue muscle and fascia/application of wound VAC/application of Kerecis micro graft 95 cm per orthopedics: Dr. Lajoyce Corners 11/14/2022  Antimicrobials:  Anti-infectives (From  admission, onward)    Start     Dose/Rate Route Frequency Ordered Stop   11/15/22 1800  ceFEPIme (MAXIPIME) 2 g in sodium chloride 0.9 % 100 mL IVPB        2 g 200 mL/hr over 30 Minutes Intravenous Every T-Th-Sa (1800) 11/14/22 1233     11/14/22 2300  vancomycin (VANCOCIN) IVPB 1000 mg/200 mL premix        1,000 mg 200 mL/hr over 60 Minutes Intravenous  Once 11/14/22 2207 11/15/22 0021   11/14/22 1500  vancomycin (VANCOCIN) IVPB 1000 mg/200 mL premix  Status:  Discontinued        1,000 mg 200 mL/hr over 60 Minutes Intravenous  Once 11/14/22 1433 11/14/22 2207   11/14/22 1400  metroNIDAZOLE (FLAGYL) IVPB 500 mg        500 mg 100 mL/hr over 60 Minutes Intravenous Every 12 hours 11/14/22 1221     11/14/22 1400  ceFEPIme (MAXIPIME) 2 g in sodium chloride 0.9 % 100 mL IVPB        2 g 200 mL/hr over 30 Minutes Intravenous  Once 11/14/22 1233 11/15/22 1857   11/14/22 0923  vancomycin (VANCOCIN) powder  Status:  Discontinued          As needed 11/14/22 0924 11/14/22 0930   11/14/22 0745  vancomycin (VANCOCIN) IVPB 1000 mg/200 mL premix  Status:  Discontinued        1,000 mg 200 mL/hr over 60 Minutes Intravenous On call to  O.R. 11/14/22 0631 11/14/22 1032   11/14/22 0730  levofloxacin (LEVAQUIN) IVPB 500 mg  Status:  Discontinued        500 mg 100 mL/hr over 60 Minutes Intravenous On call to O.R. 11/14/22 0631 11/14/22 1032   11/13/22 1730  fluconazole (DIFLUCAN) tablet 150 mg        150 mg Oral  Once 11/13/22 1630 11/13/22 1707   11/12/22 1145  vancomycin (VANCOCIN) IVPB 1000 mg/200 mL premix  Status:  Discontinued        1,000 mg 200 mL/hr over 60 Minutes Intravenous On call to O.R. 11/12/22 1052 11/12/22 1055   11/12/22 1145  levofloxacin (LEVAQUIN) IVPB 500 mg  Status:  Discontinued        500 mg 100 mL/hr over 60 Minutes Intravenous On call to O.R. 11/12/22 1052 11/12/22 1055   11/12/22 1054  vancomycin (VANCOCIN) 1-5 GM/200ML-% IVPB  Status:  Discontinued       Note to Pharmacy:  [facility], [place]: cabinet override      11/12/22 1054 11/12/22 1114   11/12/22 0815  vancomycin (VANCOCIN) IVPB 1000 mg/200 mL premix        1,000 mg 200 mL/hr over 60 Minutes Intravenous  Once 11/12/22 0718 11/12/22 2040   11/11/22 1354  vancomycin (VANCOCIN) IVPB 1000 mg/200 mL premix  Status:  Discontinued        1,000 mg 200 mL/hr over 60 Minutes Intravenous Every T-Th-Sa (Hemodialysis) 11/11/22 1354 11/16/22 1645   11/10/22 1700  piperacillin-tazobactam (ZOSYN) IVPB 2.25 g  Status:  Discontinued        2.25 g 100 mL/hr over 30 Minutes Intravenous Every 8 hours 11/10/22 1645 11/14/22 1221   11/10/22 1645  vancomycin (VANCOREADY) IVPB 2000 mg/400 mL        2,000 mg 200 mL/hr over 120 Minutes Intravenous  Once 11/10/22 1632 11/11/22 1930         Subjective: Patient laying in bed.  Complains of dizziness when she lays flat.  Denies any chest pain or shortness of breath.  No abdominal pain.  Feels decreased dose of oxycodone is helping manage her pain and makes her less drowsy.   Objective: Vitals:   11/16/22 1345 11/16/22 2009 11/17/22 0444 11/17/22 0736  BP: (!) 107/54 (!) 117/54 (!) 114/53 (!) 111/50  Pulse: 62 63 63 86  Resp: 18 16 17 18   Temp: 98.1 F (36.7 C) 97.6 F (36.4 C)  98.4 F (36.9 C)  TempSrc: Oral Oral  Oral  SpO2: 99% 100% 100% 100%  Weight:      Height:        Intake/Output Summary (Last 24 hours) at 11/17/2022 1007 Last data filed at 11/17/2022 0600 Gross per 24 hour  Intake --  Output 40 ml  Net -40 ml    Filed Weights   11/11/22 1449 11/12/22 1056 11/15/22 0858  Weight: 94.4 kg 100 kg 100 kg    Examination:  General exam: NAD Respiratory system: Lungs clear to auscultation bilaterally.  No wheezes, no crackles, no rhonchi.  Fair air movement.  Speaking in full sentences.   Cardiovascular system: Regular rate and rhythm no murmurs rubs or gallops.  No JVD.  No lower extremity edema.   Gastrointestinal system: Abdomen is soft, nontender,  nondistended, positive bowel sounds.  No rebound.  No guarding.  Central nervous system: Alert and oriented. No focal neurological deficits. Extremities: Left lower extremity bandaged and wound VAC in place.  Status post right BKA. Skin: No rashes, lesions or  ulcers Psychiatry: Judgement and insight appear normal. Mood & affect appropriate.     Data Reviewed: I have personally reviewed following labs and imaging studies  CBC: Recent Labs  Lab 11/12/22 0905 11/13/22 0052 11/14/22 0702 11/15/22 0415 11/16/22 0152 11/17/22 0739  WBC 5.3 5.8 5.8 4.8 5.3 5.5  NEUTROABS 3.7 4.4 3.8 3.9  --  3.7  HGB 9.0* 9.5* 9.0* 8.3* 8.5* 9.1*  HCT 29.8* 30.7* 29.3* 26.9* 27.0* 30.3*  MCV 77.8* 79.9* 80.5 77.3* 78.3* 81.5  PLT 127* 133* 151 157 159 171     Basic Metabolic Panel: Recent Labs  Lab 11/13/22 0052 11/14/22 0702 11/15/22 0415 11/16/22 0152 11/17/22 0739  NA 133* 135 131* 132* 133*  K 3.5 3.6 3.5 3.5 3.9  CL 96* 94* 94* 96* 98  CO2 25 22 23 26  21*  GLUCOSE 260* 84 461* 325* 80  BUN 59* 89* 61* 42* 60*  CREATININE 4.58* 6.10* 4.33* 3.30* 4.84*  CALCIUM 8.5* 8.5* 8.5* 8.6* 9.0  PHOS 6.7* 7.8* 5.9* 4.1 6.4*     GFR: Estimated Creatinine Clearance: 12.1 mL/min (A) (by C-G formula based on SCr of 4.84 mg/dL (H)).  Liver Function Tests: Recent Labs  Lab 11/10/22 1345 11/12/22 0905 11/13/22 0052 11/14/22 0702 11/15/22 0415 11/16/22 0152 11/17/22 0739  AST 30  --   --   --   --   --   --   ALT 19  --   --   --   --   --   --   ALKPHOS 201*  --   --   --   --   --   --   BILITOT 0.9  --   --   --   --   --   --   PROT 6.5  --   --   --   --   --   --   ALBUMIN 3.0*   < > 2.2* 2.2* 2.5* 2.6* 2.8*   < > = values in this interval not displayed.     CBG: Recent Labs  Lab 11/16/22 1709 11/16/22 2153 11/17/22 0306 11/17/22 0734 11/17/22 0938  GLUCAP 194* 210* 147* 73 121*      Recent Results (from the past 240 hour(s))  Blood Cultures x 2 sites     Status:  None   Collection Time: 11/10/22 12:58 PM   Specimen: BLOOD RIGHT ARM  Result Value Ref Range Status   Specimen Description BLOOD RIGHT ARM  Final   Special Requests   Final    BOTTLES DRAWN AEROBIC AND ANAEROBIC Blood Culture results may not be optimal due to an inadequate volume of blood received in culture bottles   Culture   Final    NO GROWTH 5 DAYS Performed at [facility], [address], [place], [place] [address]    Report Status 11/15/2022 FINAL  Final  Blood Cultures x 2 sites     Status: None   Collection Time: 11/10/22  4:41 PM   Specimen: BLOOD RIGHT FOREARM  Result Value Ref Range Status   Specimen Description BLOOD RIGHT FOREARM  Final   Special Requests   Final    BOTTLES DRAWN AEROBIC AND ANAEROBIC Blood Culture adequate volume   Culture   Final    NO GROWTH 5 DAYS Performed at [facility], [address] [place], [place] [address]    Report Status 11/15/2022 FINAL  Final  Surgical pcr screen     Status:  Abnormal   Collection Time: 11/11/22 11:31 PM   Specimen: Nasal Mucosa; Nasal Swab  Result Value Ref Range Status   MRSA, PCR NEGATIVE NEGATIVE Final   Staphylococcus aureus POSITIVE (A) NEGATIVE Final    Comment: (NOTE) The Xpert SA Assay (FDA approved for NASAL specimens in patients 46 years of age and older), is one component of a comprehensive surveillance program. It is not intended to diagnose infection nor to guide or monitor treatment. Performed at [facility], [address] [place], [place] [address]   Aerobic/Anaerobic Culture w Gram Stain (surgical/deep wound)     Status: None (Preliminary result)   Collection Time: 11/14/22  9:19 AM   Specimen: Soft Tissue, Other  Result Value Ref Range Status   Specimen Description TISSUE  Final   Special Requests NONE  Final   Gram Stain   Final    MODERATE WBC PRESENT,BOTH PMN AND MONONUCLEAR RARE SQUAMOUS EPITHELIAL CELLS PRESENT NO ORGANISMS SEEN Performed at [facility], [address] [place], [place] [address]    Culture   Final    FEW PROTEUS MIRABILIS RARE KLEBSIELLA PNEUMONIAE NO ANAEROBES ISOLATED; CULTURE IN PROGRESS FOR 5 DAYS    Report Status PENDING  Incomplete   Organism ID, Bacteria PROTEUS MIRABILIS  Final   Organism ID, Bacteria KLEBSIELLA PNEUMONIAE  Final      Susceptibility   Klebsiella pneumoniae - MIC*    AMPICILLIN >=32 RESISTANT Resistant     CEFEPIME <=0.12 SENSITIVE Sensitive     CEFTAZIDIME <=1 SENSITIVE Sensitive     CEFTRIAXONE <=0.25 SENSITIVE Sensitive     CIPROFLOXACIN <=0.25 SENSITIVE Sensitive     GENTAMICIN <=1 SENSITIVE Sensitive     IMIPENEM <=0.25 SENSITIVE Sensitive     TRIMETH/SULFA <=20 SENSITIVE Sensitive     AMPICILLIN/SULBACTAM 8 SENSITIVE Sensitive     PIP/TAZO 8 SENSITIVE Sensitive     * RARE KLEBSIELLA PNEUMONIAE   Proteus mirabilis - MIC*    AMPICILLIN <=2 SENSITIVE Sensitive     CEFEPIME <=0.12 SENSITIVE Sensitive     CEFTAZIDIME <=1 SENSITIVE Sensitive     CEFTRIAXONE <=0.25 SENSITIVE Sensitive     CIPROFLOXACIN <=0.25 SENSITIVE Sensitive     GENTAMICIN <=1 SENSITIVE Sensitive     IMIPENEM 2 SENSITIVE Sensitive     TRIMETH/SULFA <=20 SENSITIVE Sensitive     AMPICILLIN/SULBACTAM <=2 SENSITIVE Sensitive     PIP/TAZO <=4 SENSITIVE Sensitive     * FEW PROTEUS MIRABILIS         Radiology Studies: No results found.      Scheduled Meds:  (feeding supplement) PROSource Plus  30 mL Oral BID BM   acetaminophen  1,000 mg Oral TID   amiodarone  200 mg Oral Daily   apixaban  5 mg Oral BID   ascorbic acid  500 mg Oral Daily   atorvastatin  80 mg Oral Daily   Chlorhexidine Gluconate Cloth  6 each Topical Q0600   clotrimazole  1 Applicatorful Vaginal QHS   feeding supplement (NEPRO CARB STEADY)  237 mL Oral Daily   folic acid  1 mg Oral Daily   insulin aspart  0-5 Units Subcutaneous QHS   insulin aspart  0-6 Units Subcutaneous TID WC   insulin glargine-yfgn  40 Units Subcutaneous  BID   levothyroxine  75 mcg Oral QAC breakfast   LORazepam  0.5 mg Oral QHS   midodrine  10 mg Oral Q T,Th,Sa-HD   pantoprazole  40 mg Oral BID AC  polyethylene glycol  17 g Oral Daily   pregabalin  150 mg Oral BID   ramelteon  8 mg Oral QHS   senna-docusate  2 tablet Oral BID   sevelamer carbonate  1,600 mg Oral TID WC   sodium hypochlorite   Irrigation BID   venlafaxine XR  75 mg Oral Q breakfast   zinc sulfate  220 mg Oral Daily   Continuous Infusions:  sodium chloride     albumin human 25 g (11/15/22 0940)   ceFEPime (MAXIPIME) IV 2 g (11/15/22 2307)   methocarbamol (ROBAXIN) IV     metronidazole 500 mg (11/17/22 0445)     LOS: 7 days    Time spent: 35 minutes    Ramiro Harvest, MD [facility]   To contact the attending provider between 7A-7P or the covering provider during after hours 7P-7A, please log into the web site [email or web address] and access using universal [place] password for that web site. If you do not have the password, please call the hospital operator.  11/17/2022, 10:07 AM

## 2022-11-17 NOTE — Anesthesia Postprocedure Evaluation (Signed)
Anesthesia Post Note  Patient: CHASTA DESHPANDE  Procedure(s) Performed: IRRIGATION AND DEBRIDEMENT OF LOWER EXTREMITY WOUND APPLICATION OF WOUND VAC (Left)     Patient location during evaluation: PACU Anesthesia Type: General Level of consciousness: awake and alert Pain management: pain level controlled Vital Signs Assessment: post-procedure vital signs reviewed and stable Respiratory status: spontaneous breathing, nonlabored ventilation, respiratory function stable and patient connected to nasal cannula oxygen Cardiovascular status: blood pressure returned to baseline and stable Postop Assessment: no apparent nausea or vomiting Anesthetic complications: no  No notable events documented.  Last Vitals:  Vitals:   11/17/22 0444 11/17/22 0736  BP: (!) 114/53 (!) 111/50  Pulse: 63 86  Resp: 17 18  Temp:  36.9 C  SpO2: 100% 100%    Last Pain:  Vitals:   11/17/22 0736  TempSrc: Oral  PainSc:                  Elvie Palomo L Jacqualynn Parco

## 2022-11-17 NOTE — Progress Notes (Signed)
Patient complaining of shortness of breath and chest pain. MD notified. EKG completed.

## 2022-11-17 NOTE — Progress Notes (Signed)
Inpatient Rehabilitation Admissions Coordinator   I will begin insurance Auth with [employer] for possible CIR admit. I met with patient and she is aware and in agreement.  Ottie Glazier, RN, MSN Rehab Admissions Coordinator [phone] 11/17/2022 10:48 AM

## 2022-11-18 DIAGNOSIS — T148XXA Other injury of unspecified body region, initial encounter: Secondary | ICD-10-CM | POA: Diagnosis not present

## 2022-11-18 DIAGNOSIS — I952 Hypotension due to drugs: Secondary | ICD-10-CM | POA: Diagnosis not present

## 2022-11-18 DIAGNOSIS — N186 End stage renal disease: Secondary | ICD-10-CM | POA: Diagnosis not present

## 2022-11-18 DIAGNOSIS — E1142 Type 2 diabetes mellitus with diabetic polyneuropathy: Secondary | ICD-10-CM | POA: Diagnosis not present

## 2022-11-18 LAB — RENAL FUNCTION PANEL
Albumin: 2.6 g/dL — ABNORMAL LOW (ref 3.5–5.0)
Anion gap: 14 (ref 5–15)
BUN: 70 mg/dL — ABNORMAL HIGH (ref 8–23)
CO2: 23 mmol/L (ref 22–32)
Calcium: 8.7 mg/dL — ABNORMAL LOW (ref 8.9–10.3)
Chloride: 97 mmol/L — ABNORMAL LOW (ref 98–111)
Creatinine, Ser: 5.43 mg/dL — ABNORMAL HIGH (ref 0.44–1.00)
GFR, Estimated: 8 mL/min — ABNORMAL LOW (ref >60)
Glucose, Bld: 85 mg/dL (ref 70–99)
Phosphorus: 6.5 mg/dL — ABNORMAL HIGH (ref 2.5–4.6)
Potassium: 4.2 mmol/L (ref 3.5–5.1)
Sodium: 134 mmol/L — ABNORMAL LOW (ref 135–145)

## 2022-11-18 LAB — CBC
HCT: 29.2 % — ABNORMAL LOW (ref 36.0–46.0)
Hemoglobin: 8.7 g/dL — ABNORMAL LOW (ref 12.0–15.0)
MCH: 23.6 pg — ABNORMAL LOW (ref 26.0–34.0)
MCHC: 29.8 g/dL — ABNORMAL LOW (ref 30.0–36.0)
MCV: 79.3 fL — ABNORMAL LOW (ref 80.0–100.0)
Platelets: 167 10*3/uL (ref 150–400)
RBC: 3.68 MIL/uL — ABNORMAL LOW (ref 3.87–5.11)
RDW: 16.5 % — ABNORMAL HIGH (ref 11.5–15.5)
WBC: 5.7 10*3/uL (ref 4.0–10.5)
nRBC: 0 % (ref 0.0–0.2)

## 2022-11-18 LAB — GLUCOSE, CAPILLARY
Glucose-Capillary: 85 mg/dL (ref 70–99)
Glucose-Capillary: 87 mg/dL (ref 70–99)
Glucose-Capillary: 196 mg/dL — ABNORMAL HIGH (ref 70–99)
Glucose-Capillary: 147 mg/dL — ABNORMAL HIGH (ref 70–99)
Glucose-Capillary: 223 mg/dL — ABNORMAL HIGH (ref 70–99)
Glucose-Capillary: 228 mg/dL — ABNORMAL HIGH (ref 70–99)

## 2022-11-18 MED ORDER — INSULIN GLARGINE-YFGN 100 UNIT/ML ~~LOC~~ SOLN
20.0000 [IU] | Freq: Every day | SUBCUTANEOUS | Status: DC
  Administered 2022-11-18: 20 [IU] via SUBCUTANEOUS
  Filled 2022-11-18 (×2): qty 0.2

## 2022-11-18 MED ORDER — LIDOCAINE HCL (PF) 1 % IJ SOLN: 5.0000 mL | INTRAMUSCULAR | Status: DC | PRN

## 2022-11-18 MED ORDER — ANTICOAGULANT SODIUM CITRATE 4% (200MG/5ML) IV SOLN: 5.0000 mL | Status: DC | PRN

## 2022-11-18 MED ORDER — ALTEPLASE 2 MG IJ SOLR: 2.0000 mg | Freq: Once | INTRAMUSCULAR | Status: DC | PRN

## 2022-11-18 MED ORDER — LIDOCAINE-PRILOCAINE 2.5-2.5 % EX CREA: 1.0000 | TOPICAL_CREAM | CUTANEOUS | Status: DC | PRN

## 2022-11-18 MED ORDER — PENTAFLUOROPROP-TETRAFLUOROETH EX AERO: 1.0000 | INHALATION_SPRAY | CUTANEOUS | Status: DC | PRN

## 2022-11-18 MED ORDER — IPRATROPIUM-ALBUTEROL 0.5-2.5 (3) MG/3ML IN SOLN: 3.0000 mL | Freq: Four times a day (QID) | RESPIRATORY_TRACT | Status: DC | PRN

## 2022-11-18 MED ORDER — HEPARIN SODIUM (PORCINE) 1000 UNIT/ML DIALYSIS
1000.0000 [IU] | INTRAMUSCULAR | Status: DC | PRN
  Filled 2022-11-18: qty 1

## 2022-11-18 NOTE — Progress Notes (Signed)
Inpatient Rehabilitation Admissions Coordinator   I have received insurance approval for CIR admit and am hopeful for a bed to be available over the next 24 to 48 hrs. I contacted patient and spouse at bedside by phone and they are aware. We will contact [facility] about her Prosthetic leg to have it brought to CIR to begin use.  Ottie Glazier, RN, MSN Rehab Admissions Coordinator [phone] 11/18/2022 6:04 PM

## 2022-11-18 NOTE — Progress Notes (Signed)
   11/18/22 1253  Vitals  Temp (!) 97.4 F (36.3 C)  Pulse Rate 69  Resp 16  BP 106/65  SpO2 99 %  O2 Device Nasal Cannula  Weight 97.2 kg  Oxygen Therapy  O2 Flow Rate (L/min) 3 L/min  Pulse Oximetry Type Continuous  Oximetry Probe Site Changed No  Patient Activity (if Appropriate) In bed  Post Treatment  Dialyzer Clearance Lightly streaked  Duration of HD Treatment -hour(s) 3.45 hour(s)  Hemodialysis Intake (mL) 0 mL  Liters Processed 89.6  Fluid Removed (mL) 1500 mL  Tolerated HD Treatment Yes   Received patient in bed to unit.  Alert and oriented.  Informed consent signed and in chart.   TX duration:3.45  Patient tolerated well.  Transported back to the room  Alert, without acute distress.  Hand-off given to patient's nurse.   Access used: [name] Access issues: no complications---lines reversed  Total UF removed: 1500 Medication(s) given: none   [name]

## 2022-11-18 NOTE — Progress Notes (Addendum)
[facility] Progress Note   Subjective:  Seen in dialysis unit. Got dizzy when she got to the unit.  SOB overnight, increased O2 requirement - attempting 3L UF today.   Objective Vitals:   11/17/22 2100 11/17/22 2250 11/18/22 0505 11/18/22 0802  BP:  (!) 107/48 (!) 122/55 (!) 104/45  Pulse: 69 71 64 69  Resp: 18 18 18 18   Temp:  97.8 F (36.6 C) 98 F (36.7 C) 97.7 F (36.5 C)  TempSrc:  Oral Oral Oral  SpO2: 99% 100% 100%   Weight:      Height:       Physical Exam General: chronically ill appearing female in NAD Heart: RRR, no mrg Lungs: CTAB, nml WOB on RA Abdomen:soft, NTND Extremities: L leg dressed, wound vac in place, 1+ edema in hips, R BKA Dialysis Access: R TDC, L AVF no bruit   Filed Weights   11/11/22 1449 11/12/22 1056 11/15/22 0858  Weight: 94.4 kg 100 kg 100 kg    Intake/Output Summary (Last 24 hours) at 11/18/2022 0936 Last data filed at 11/17/2022 1400 Gross per 24 hour  Intake 240 ml  Output --  Net 240 ml     Additional Objective Labs: Basic Metabolic Panel: Recent Labs  Lab 11/16/22 0152 11/17/22 0739 11/18/22 0151  NA 132* 133* 134*  K 3.5 3.9 4.2  CL 96* 98 97*  CO2 26 21* 23  GLUCOSE 325* 80 85  BUN 42* 60* 70*  CREATININE 3.30* 4.84* 5.43*  CALCIUM 8.6* 9.0 8.7*  PHOS 4.1 6.4* 6.5*    Liver Function Tests: Recent Labs  Lab 11/16/22 0152 11/17/22 0739 11/18/22 0151  ALBUMIN 2.6* 2.8* 2.6*     CBC: Recent Labs  Lab 11/14/22 0702 11/15/22 0415 11/16/22 0152 11/17/22 0739 11/18/22 0151  WBC 5.8 4.8 5.3 5.5 5.7  NEUTROABS 3.8 3.9  --  3.7  --   HGB 9.0* 8.3* 8.5* 9.1* 8.7*  HCT 29.3* 26.9* 27.0* 30.3* 29.2*  MCV 80.5 77.3* 78.3* 81.5 79.3*  PLT 151 157 159 171 167    CBG: Recent Labs  Lab 11/17/22 2159 11/17/22 2247 11/18/22 0146 11/18/22 0256 11/18/22 0756  GLUCAP 79 75 85 87 196*     Medications:  sodium chloride     albumin human 25 g (11/15/22 0940)   anticoagulant sodium citrate       ceFAZolin (ANCEF) IV     methocarbamol (ROBAXIN) IV      (feeding supplement) PROSource Plus  30 mL Oral BID BM   acetaminophen  1,000 mg Oral TID   amiodarone  200 mg Oral Daily   apixaban  5 mg Oral BID   ascorbic acid  500 mg Oral Daily   atorvastatin  80 mg Oral Daily   Chlorhexidine Gluconate Cloth  6 each Topical Q0600   clotrimazole  1 Applicatorful Vaginal QHS   feeding supplement (NEPRO CARB STEADY)  237 mL Oral BID BM   folic acid  1 mg Oral Daily   insulin aspart  0-5 Units Subcutaneous QHS   insulin aspart  0-6 Units Subcutaneous TID WC   insulin glargine-yfgn  30 Units Subcutaneous QHS   insulin glargine-yfgn  40 Units Subcutaneous Daily   levothyroxine  75 mcg Oral QAC breakfast   LORazepam  0.5 mg Oral QHS   midodrine  10 mg Oral Q T,Th,Sa-HD   multivitamin  1 tablet Oral QHS   pantoprazole  40 mg Oral BID AC   polyethylene glycol  17  g Oral Daily   pregabalin  150 mg Oral BID   ramelteon  8 mg Oral QHS   senna-docusate  2 tablet Oral BID   sevelamer carbonate  1,600 mg Oral TID WC   venlafaxine XR  75 mg Oral Q breakfast   zinc sulfate  220 mg Oral Daily    Dialysis Orders: [facility] TTS 4:00 400/800 EDW 91.5kg 2K/2Ca TDC Heparin 4000 -Mircera 75  - last 6/8 -Calcitriol 0.5 three times per week     Assessment/Plan: Infected left lower extremity wound - maggot infestation on admission. Blood cultures neg. Ortho following - s/p OR debridement on 6/21, now with wound vac. Wound cultures+ proteus/klebsiella --Continue cefazolin 2g qHD until 11/28/22 ESRD -  HD TTS. HD today Thrombosed AVF - not salvageable. For access surgery next month  Hypertension/volume  - BP controlled. Does have some volume on exam. UF as able.  Anemia  - Hgb 8.5. Received Aranesp 150 on 6/23. Metabolic bone disease -  Ca acceptable. Phos above goal. Continues sevelamer binders with diet.  Nutrition - Renal diet with fluid restriction.  T2DM - poorly controlled. Insulin per primary Dispo  - possible CIR admit   Tomasa Blase PA-C [facility] 11/18/2022,9:36 AM

## 2022-11-18 NOTE — Progress Notes (Signed)
Inpatient Rehabilitation Admissions Coordinator   I await insurance determination for a possible CIR admit.  Ottie Glazier, RN, MSN Rehab Admissions Coordinator [phone] 11/18/2022 1:05 PM

## 2022-11-18 NOTE — Progress Notes (Signed)
PT Cancellation Note  Patient Details Name: TARRIN MENN MRN: [id number] DOB: 08-22-1950   Cancelled Treatment:    Reason Eval/Treat Not Completed: (P) Patient at procedure or test/unavailable (off unit for HD will continue efforts per POC.)   Myeesha Shane J Aundria Rud 11/18/2022, 11:01 AM  Bonney Leitz , PTA Acute Rehabilitation Services Office [phone]

## 2022-11-18 NOTE — Progress Notes (Signed)
Patient ID: Susan [name], female   DOB: Aug 21, 1950, 72 y.o.   MRN: [id number] Patient is slow to progress.  She is on 5 L of nasal cannula FiO2 and states she still feels out of breath.  There is no new drainage in the wound VAC canister which is 300 cc.  Possible discharge to inpatient rehab.

## 2022-11-18 NOTE — Plan of Care (Signed)
  Problem: Clinical Measurements: Goal: Will remain free from infection Outcome: Progressing   Problem: Activity: Goal: Risk for activity intolerance will decrease Outcome: Progressing   Problem: Coping: Goal: Level of anxiety will decrease Outcome: Progressing   

## 2022-11-18 NOTE — Progress Notes (Signed)
Patient ID: Susan [name], female   DOB: January 09, 1951, 72 y.o.   MRN: [id number] Plan for removal of wound  VAC dressing this afternoon.  I will take photographs of the wound tomorrow morning and evaluate for dressing options.

## 2022-11-18 NOTE — Progress Notes (Signed)
PROGRESS NOTE    Susan [name]  ZOX:[id number] DOB: [birth year] DOA: 11/10/2022 PCP: Donita Brooks, MD    No chief complaint on file.   Brief Narrative:  72yo female with h/o CAD, chronic diastolic CHF, HLD, TTS HD, HTN, PVD s/p R BKA, hypothyroidism, morbid obesity, afib, and DM presenting with maggot infestation of her LLE. Dr. Lajoyce Corners sent her to the ER and plans for surgical debridement later this week. She was started on Vanc/Zosyn.    Assessment & Plan:   Principal Problem:   Complicated wound infection Active Problems:   Type 2 diabetes mellitus with diabetic polyneuropathy, with long-term current use of insulin (HCC)   ESRD on dialysis (HCC)   Paroxysmal atrial fibrillation (HCC)   Hyperlipemia   Hypotension   Impaired ambulation   Hypothyroidism   Prolonged QT interval   Obesity, Class III, BMI 40-49.9 (morbid obesity) (HCC)   S/P BKA (below knee amputation) unilateral, right (HCC)   Maggot infestation   Cellulitis of left lower extremity  #1 left lower extremity wound infection/necrotic wound lateral left leg with maggots -Patient noted to have failed conservative outpatient therapy and recommended for admission by orthopedics, Dr. Lajoyce Corners. -Patient seen in consultation by Dr. Lajoyce Corners and plan for debridement of the wound and application of Kerecis tissue graft and a cleanse choice Prevena wound VAC dressing. -Patient was to have debridement done on 11/12/2022 however procedure canceled and postponed till, 11/14/2022. -Patient seemed somewhat hesitant to take pain medications at this time and as such patient placed on scheduled Tylenol 1000 mg 3 times daily.   -Patient status post excisional debridement of lower extremity with application of wound VAC, 11/14/2022, cultures sent and growing Proteus mirabilis and Klebsiella pneumoniae.  -Patient with complaints of left lower extremity pain, however earlier on in the hospitalization patient was hesitant to take oxycodone as she  stated makes her very drowsy and sleepy. -Patient noted to have received oxycodone 15 mg on 11/15/2022 in the evening. -Decreased dose of oxycodone 5 mg every 4 hours as needed more tolerable to patient and patient less drowsy.   -Patient was on IV Vanco, IV cefepime, IV Flagyl.   -IV vancomycin discontinued 11/16/2022.   -Patient seen in consultation by ID and IV antibiotics narrowed to IV cefazolin with end date 11/29/2022.   -ID and orthopedics following and appreciate input and recommendations.   2.  ESRD on HD TTS -Patient noted with thrombosed aVF felt not salvageable per nephrology and patient for planned outpatient follow-up in 3 to 4 weeks. -Nephrology consulted and following.  3.  Diabetes mellitus type 2 -Hemoglobin A1c 7.9 (08/27/2022). -CBG 87 this morning and patient with complaints of dizziness. -Continue Semglee 40 units in the morning decrease nighttime dose to 20 units.  SSI.   4.  Atrial fibrillation -Continue amiodarone.   -Eliquis for anticoagulation.    5.  History of right renal infarct, splenic infarct -In the setting of underlying A-fib. -Eliquis held early on during the hospitalization in anticipation of excisional debridement and resumed, 11/15/2022.   6.  Hypothyroidism -Synthroid.  7.  Hypotension -Midodrine.   8.  PVD status post right BKA -Continue statin.   -Eliquis was held for excisional debridement and resumed 11/15/2022.    9.  Morbid obesity -BMI 41.77 -Lifestyle modification. -Outpatient follow-up with PCP.  10.  Ambulatory dysfunction -Patient noted unable to bear weight to assist with transfers since BKA. -Being fitted for prosthesis. -PT/OT has assessed patient recommending CIR. -Patient being assessed by  CIR..  11.  Prolonged QTc -Repeat EKG with resolution of QTc prolongation.   12.  MDD/GAD with panic  -Patient seems to have a brighter affect today and more interactive.   - Patient seen in consultation by psychiatry -Patient  currently on vortioxetine and been transitioned to venlafaxine per psychiatry. -Patient feels new antidepressant medication working much better than prior one. -Psychiatry was following but have signed off as of 11/14/2022.   13.  Constipation -Patient with complaints of constipation on 11/15/2022, was placed on bowel regimen given sorbitol and Dulcolax suppositories and have been multiple bowel movements. -Continue current bowel regimen of MiraLAX daily, Senokot-S twice daily.  14.  Dizziness -Patient noted to have CBG of 73 the morning of 11/17/2022. -Low CBG likely etiology of dizziness. -Patient not orthostatic. -CBG 87 this morning. -Nighttime long-acting insulin dose adjusted, will decrease further to 20 units at bedtime.  15.  Shortness of breath -Patient with some complaints of shortness of breath. -Chest x-ray done with no acute abnormalities noted. -Patient ESRD on HD, being followed by nephrology and patient currently in hemodialysis today. -Volume management in hemodialysis.  16.  Stage II pressure injury of the sacrum, POA Pressure Injury 08/27/22 Coccyx Mid Stage 2 -  Partial thickness loss of dermis presenting as a shallow open injury with a red, pink wound bed without slough. 37mmx5mmx0 red center unattached edges (Active)  08/27/22 1000  Location: Coccyx  Location Orientation: Mid  Staging: Stage 2 -  Partial thickness loss of dermis presenting as a shallow open injury with a red, pink wound bed without slough.  Wound Description (Comments): 92mmx5mmx0 red center unattached edges  Present on Admission:   WOC RN consulted.    -   DVT prophylaxis: Eliquis resumed 11/15/2022.   Code Status: Full Family Communication: Updated patient, no family at bedside.   Disposition: CIR when bed available.  Status is: Inpatient Remains inpatient appropriate because: Severity of illness   Consultants:  WOC RN: Ria Bush, RN 11/10/2022 Nephrology: Dr. Signe Colt  11/11/2022 Psychiatry: Dr. Gasper Sells 11/11/2022 Orthopedics: Dr. Lajoyce Corners 11/12/2022 ID: Dr. Thedore Mins 11/17/2022  Procedures:  Excisional debridement of lower extremity wound with excision skin and soft tissue muscle and fascia/application of wound VAC/application of Kerecis micro graft 95 cm per orthopedics: Dr. Lajoyce Corners 11/14/2022  Antimicrobials:  Anti-infectives (From admission, onward)    Start     Dose/Rate Route Frequency Ordered Stop   11/18/22 1800  ceFAZolin (ANCEF) IVPB 2g/100 mL premix        2 g 200 mL/hr over 30 Minutes Intravenous Every T-Th-Sa (1800) 11/17/22 1139     11/18/22 0000  ceFAZolin (ANCEF) IVPB        2 g Intravenous Every T-Th-Sa (Hemodialysis) 11/17/22 1150 11/28/22 2359   11/17/22 1230  ceFAZolin (ANCEF) IVPB 1 g/50 mL premix        1 g 100 mL/hr over 30 Minutes Intravenous  Once 11/17/22 1139 11/17/22 1245   11/15/22 1800  ceFEPIme (MAXIPIME) 2 g in sodium chloride 0.9 % 100 mL IVPB  Status:  Discontinued        2 g 200 mL/hr over 30 Minutes Intravenous Every T-Th-Sa (1800) 11/14/22 1233 11/17/22 1139   11/14/22 2300  vancomycin (VANCOCIN) IVPB 1000 mg/200 mL premix        1,000 mg 200 mL/hr over 60 Minutes Intravenous  Once 11/14/22 2207 11/15/22 0021   11/14/22 1500  vancomycin (VANCOCIN) IVPB 1000 mg/200 mL premix  Status:  Discontinued  1,000 mg 200 mL/hr over 60 Minutes Intravenous  Once 11/14/22 1433 11/14/22 2207   11/14/22 1400  metroNIDAZOLE (FLAGYL) IVPB 500 mg  Status:  Discontinued        500 mg 100 mL/hr over 60 Minutes Intravenous Every 12 hours 11/14/22 1221 11/17/22 1252   11/14/22 1400  ceFEPIme (MAXIPIME) 2 g in sodium chloride 0.9 % 100 mL IVPB        2 g 200 mL/hr over 30 Minutes Intravenous  Once 11/14/22 1233 11/15/22 1857   11/14/22 0923  vancomycin (VANCOCIN) powder  Status:  Discontinued          As needed 11/14/22 0924 11/14/22 0930   11/14/22 0745  vancomycin (VANCOCIN) IVPB 1000 mg/200 mL premix  Status:  Discontinued         1,000 mg 200 mL/hr over 60 Minutes Intravenous On call to O.R. 11/14/22 0631 11/14/22 1032   11/14/22 0730  levofloxacin (LEVAQUIN) IVPB 500 mg  Status:  Discontinued        500 mg 100 mL/hr over 60 Minutes Intravenous On call to O.R. 11/14/22 0631 11/14/22 1032   11/13/22 1730  fluconazole (DIFLUCAN) tablet 150 mg        150 mg Oral  Once 11/13/22 1630 11/13/22 1707   11/12/22 1145  vancomycin (VANCOCIN) IVPB 1000 mg/200 mL premix  Status:  Discontinued        1,000 mg 200 mL/hr over 60 Minutes Intravenous On call to O.R. 11/12/22 1052 11/12/22 1055   11/12/22 1145  levofloxacin (LEVAQUIN) IVPB 500 mg  Status:  Discontinued        500 mg 100 mL/hr over 60 Minutes Intravenous On call to O.R. 11/12/22 1052 11/12/22 1055   11/12/22 1054  vancomycin (VANCOCIN) 1-5 GM/200ML-% IVPB  Status:  Discontinued       Note to Pharmacy: [facility], [place]: cabinet override      11/12/22 1054 11/12/22 1114   11/12/22 0815  vancomycin (VANCOCIN) IVPB 1000 mg/200 mL premix        1,000 mg 200 mL/hr over 60 Minutes Intravenous  Once 11/12/22 0718 11/12/22 2040   11/11/22 1354  vancomycin (VANCOCIN) IVPB 1000 mg/200 mL premix  Status:  Discontinued        1,000 mg 200 mL/hr over 60 Minutes Intravenous Every T-Th-Sa (Hemodialysis) 11/11/22 1354 11/16/22 1645   11/10/22 1700  piperacillin-tazobactam (ZOSYN) IVPB 2.25 g  Status:  Discontinued        2.25 g 100 mL/hr over 30 Minutes Intravenous Every 8 hours 11/10/22 1645 11/14/22 1221   11/10/22 1645  vancomycin (VANCOREADY) IVPB 2000 mg/400 mL        2,000 mg 200 mL/hr over 120 Minutes Intravenous  Once 11/10/22 1632 11/11/22 1930         Subjective: Patient in hemodialysis.  Some complaints of shortness of breath.  Some complaints of dizziness as well.  Denies any chest pain today.  No abdominal pain.  Diffuse pain medication helping with left lower extremity pain.    Objective: Vitals:   11/18/22 1333 11/18/22 1340 11/18/22 1445 11/18/22 1716  BP:  96/77 96/77 (!) 93/34 97/83  Pulse: 97 97 67 66  Resp: 17 17 18 16   Temp: 97.8 F (36.6 C) 97.8 F (36.6 C) 98.2 F (36.8 C)   TempSrc: Oral Oral Oral   SpO2: 100% 100% 100% 100%  Weight:      Height:        Intake/Output Summary (Last 24 hours) at 11/18/2022 1759  Last data filed at 11/18/2022 1253 Gross per 24 hour  Intake --  Output 1500 ml  Net -1500 ml    Filed Weights   11/12/22 1056 11/15/22 0858 11/18/22 1253  Weight: 100 kg 100 kg 97.2 kg    Examination:  General exam: NAD Respiratory system: CTAB.  No wheezes, no crackles, no rhonchi.  Fair air movement.  Speaking in full sentences.  Cardiovascular system: RRR no murmurs rubs or gallops.  No JVD.  No lower extremity edema.   Gastrointestinal system: Abdomen is soft, nontender, nondistended, positive bowel sounds.  No rebound.  No guarding.   Central nervous system: Alert and oriented. No focal neurological deficits. Extremities: Left lower extremity bandaged and wound VAC in place.  Status post right BKA. Skin: No rashes, lesions or ulcers Psychiatry: Judgement and insight appear normal. Mood & affect appropriate.     Data Reviewed: I have personally reviewed following labs and imaging studies  CBC: Recent Labs  Lab 11/12/22 0905 11/13/22 0052 11/14/22 0702 11/15/22 0415 11/16/22 0152 11/17/22 0739 11/18/22 0151  WBC 5.3 5.8 5.8 4.8 5.3 5.5 5.7  NEUTROABS 3.7 4.4 3.8 3.9  --  3.7  --   HGB 9.0* 9.5* 9.0* 8.3* 8.5* 9.1* 8.7*  HCT 29.8* 30.7* 29.3* 26.9* 27.0* 30.3* 29.2*  MCV 77.8* 79.9* 80.5 77.3* 78.3* 81.5 79.3*  PLT 127* 133* 151 157 159 171 167     Basic Metabolic Panel: Recent Labs  Lab 11/14/22 0702 11/15/22 0415 11/16/22 0152 11/17/22 0739 11/18/22 0151  NA 135 131* 132* 133* 134*  K 3.6 3.5 3.5 3.9 4.2  CL 94* 94* 96* 98 97*  CO2 22 23 26  21* 23  GLUCOSE 84 461* 325* 80 85  BUN 89* 61* 42* 60* 70*  CREATININE 6.10* 4.33* 3.30* 4.84* 5.43*  CALCIUM 8.5* 8.5* 8.6* 9.0 8.7*  PHOS  7.8* 5.9* 4.1 6.4* 6.5*     GFR: Estimated Creatinine Clearance: 10.6 mL/min (A) (by C-G formula based on SCr of 5.43 mg/dL (H)).  Liver Function Tests: Recent Labs  Lab 11/14/22 0702 11/15/22 0415 11/16/22 0152 11/17/22 0739 11/18/22 0151  ALBUMIN 2.2* 2.5* 2.6* 2.8* 2.6*     CBG: Recent Labs  Lab 11/17/22 2247 11/18/22 0146 11/18/22 0256 11/18/22 0756 11/18/22 1624  GLUCAP 75 85 87 196* 223*      Recent Results (from the past 240 hour(s))  Blood Cultures x 2 sites     Status: None   Collection Time: 11/10/22 12:58 PM   Specimen: BLOOD RIGHT ARM  Result Value Ref Range Status   Specimen Description BLOOD RIGHT ARM  Final   Special Requests   Final    BOTTLES DRAWN AEROBIC AND ANAEROBIC Blood Culture results may not be optimal due to an inadequate volume of blood received in culture bottles   Culture   Final    NO GROWTH 5 DAYS Performed at [facility], [address], [place], [place] [address]    Report Status 11/15/2022 FINAL  Final  Blood Cultures x 2 sites     Status: None   Collection Time: 11/10/22  4:41 PM   Specimen: BLOOD RIGHT FOREARM  Result Value Ref Range Status   Specimen Description BLOOD RIGHT FOREARM  Final   Special Requests   Final    BOTTLES DRAWN AEROBIC AND ANAEROBIC Blood Culture adequate volume   Culture   Final    NO GROWTH 5 DAYS Performed at [facility], [address]  [place], [place] [address]    Report Status 11/15/2022 FINAL  Final  Surgical pcr screen     Status: Abnormal   Collection Time: 11/11/22 11:31 PM   Specimen: Nasal Mucosa; Nasal Swab  Result Value Ref Range Status   MRSA, PCR NEGATIVE NEGATIVE Final   Staphylococcus aureus POSITIVE (A) NEGATIVE Final    Comment: (NOTE) The Xpert SA Assay (FDA approved for NASAL specimens in patients 77 years of age and older), is one component of a comprehensive surveillance program. It is not intended to diagnose infection nor to guide or monitor  treatment. Performed at [facility], [address], [place], [place] [address]   Aerobic/Anaerobic Culture w Gram Stain (surgical/deep wound)     Status: None (Preliminary result)   Collection Time: 11/14/22  9:19 AM   Specimen: Soft Tissue, Other  Result Value Ref Range Status   Specimen Description TISSUE  Final   Special Requests NONE  Final   Gram Stain   Final    MODERATE WBC PRESENT,BOTH PMN AND MONONUCLEAR RARE SQUAMOUS EPITHELIAL CELLS PRESENT NO ORGANISMS SEEN    Culture   Final    FEW PROTEUS MIRABILIS RARE KLEBSIELLA PNEUMONIAE NO ANAEROBES ISOLATED FEW ENTEROCOCCUS FAECALIS SUSCEPTIBILITIES TO FOLLOW Performed at [facility], [address], [place], [place] [address]    Report Status PENDING  Incomplete   Organism ID, Bacteria PROTEUS MIRABILIS  Final   Organism ID, Bacteria KLEBSIELLA PNEUMONIAE  Final      Susceptibility   Klebsiella pneumoniae - MIC*    AMPICILLIN >=32 RESISTANT Resistant     CEFEPIME <=0.12 SENSITIVE Sensitive     CEFTAZIDIME <=1 SENSITIVE Sensitive     CEFTRIAXONE <=0.25 SENSITIVE Sensitive     CIPROFLOXACIN <=0.25 SENSITIVE Sensitive     GENTAMICIN <=1 SENSITIVE Sensitive     IMIPENEM <=0.25 SENSITIVE Sensitive     TRIMETH/SULFA <=20 SENSITIVE Sensitive     AMPICILLIN/SULBACTAM 8 SENSITIVE Sensitive     PIP/TAZO 8 SENSITIVE Sensitive     * RARE KLEBSIELLA PNEUMONIAE   Proteus mirabilis - MIC*    AMPICILLIN <=2 SENSITIVE Sensitive     CEFEPIME <=0.12 SENSITIVE Sensitive     CEFTAZIDIME <=1 SENSITIVE Sensitive     CEFTRIAXONE <=0.25 SENSITIVE Sensitive     CIPROFLOXACIN <=0.25 SENSITIVE Sensitive     GENTAMICIN <=1 SENSITIVE Sensitive     IMIPENEM 2 SENSITIVE Sensitive     TRIMETH/SULFA <=20 SENSITIVE Sensitive     AMPICILLIN/SULBACTAM <=2 SENSITIVE Sensitive     PIP/TAZO <=4 SENSITIVE Sensitive     * FEW PROTEUS MIRABILIS         Radiology Studies: DG CHEST PORT 1 VIEW  Result Date: 11/17/2022 CLINICAL DATA:   Shortness of breath EXAM: PORTABLE CHEST 1 VIEW COMPARISON:  Radiograph 09/01/2022 FINDINGS: Right IJ CVC tip in the low SVC. Stable cardiomegaly. Coronary stenting. Aortic atherosclerotic calcification. No focal consolidation, pleural effusion, or pneumothorax. No displaced rib fractures. IMPRESSION: No active disease.  Cardiomegaly. Electronically Signed   By: Minerva Fester M.D.   On: 11/17/2022 20:38        Scheduled Meds:  (feeding supplement) PROSource Plus  30 mL Oral BID BM   acetaminophen  1,000 mg Oral TID   amiodarone  200 mg Oral Daily   apixaban  5 mg Oral BID   ascorbic acid  500 mg Oral Daily   atorvastatin  80 mg Oral Daily   Chlorhexidine Gluconate Cloth  6 each Topical Q0600  clotrimazole  1 Applicatorful Vaginal QHS   feeding supplement (NEPRO CARB STEADY)  237 mL Oral BID BM   folic acid  1 mg Oral Daily   insulin aspart  0-5 Units Subcutaneous QHS   insulin aspart  0-6 Units Subcutaneous TID WC   insulin glargine-yfgn  30 Units Subcutaneous QHS   insulin glargine-yfgn  40 Units Subcutaneous Daily   levothyroxine  75 mcg Oral QAC breakfast   LORazepam  0.5 mg Oral QHS   midodrine  10 mg Oral Q T,Th,Sa-HD   multivitamin  1 tablet Oral QHS   pantoprazole  40 mg Oral BID AC   polyethylene glycol  17 g Oral Daily   pregabalin  150 mg Oral BID   ramelteon  8 mg Oral QHS   senna-docusate  2 tablet Oral BID   sevelamer carbonate  1,600 mg Oral TID WC   venlafaxine XR  75 mg Oral Q breakfast   zinc sulfate  220 mg Oral Daily   Continuous Infusions:  sodium chloride     albumin human 25 g (11/15/22 0940)    ceFAZolin (ANCEF) IV 2 g (11/18/22 1715)   methocarbamol (ROBAXIN) IV       LOS: 8 days    Time spent: 35 minutes    Ramiro Harvest, MD [facility]   To contact the attending provider between 7A-7P or the covering provider during after hours 7P-7A, please log into the web site [email or web address] and access using universal [place] password  for that web site. If you do not have the password, please call the hospital operator.  11/18/2022, 5:59 PM

## 2022-11-18 NOTE — Progress Notes (Signed)
Physical Therapy Treatment Patient Details Name: Susan [name] MRN: [id number] DOB: 1950/12/25 Today's Date: 11/18/2022   History of Present Illness 72yo female presenting 6/17 with maggot infestation of her LLE. S/P debridement of necrotic ulcer lateral left calf 6/21. h/o CAD, chronic diastolic CHF, HLD, TTS HD, HTN, PVD s/p R BKA, hypothyroidism, morbid obesity, afib, and DM.    PT Comments    Pt supine in bed reports dizziness and seeing black spots, vitals stable BP is slightly high.  Deferred transfer training due to presentation and focused on LE strengthening this session.  Pt required rolling to R and L side post session to reposition pad and improve placement in bed.  Will plan to coordinate next PT session to an off dialysis day to maximize functional gains.  Continue to recommend rehab in a post acute setting.     Recommendations for follow up therapy are one component of a multi-disciplinary discharge planning process, led by the attending physician.  Recommendations may be updated based on patient status, additional functional criteria and insurance authorization.  Follow Up Recommendations       Assistance Recommended at Discharge Frequent or constant Supervision/Assistance  Patient can return home with the following Two people to help with walking and/or transfers;A lot of help with bathing/dressing/bathroom;Assistance with cooking/housework;Assist for transportation;Help with stairs or ramp for entrance   Equipment Recommendations  None recommended by PT    Recommendations for Other Services Rehab consult     Precautions / Restrictions Precautions Precautions: Fall Precaution Comments: LLE wound Restrictions Weight Bearing Restrictions: Yes LLE Weight Bearing: Weight bearing as tolerated     Mobility  Bed Mobility Overal bed mobility: Needs Assistance Bed Mobility: Rolling Rolling: Supervision, Mod assist         General bed mobility comments: Performed  rolling to L to reposition bed pad in the correct position.  Pt performed to the R with mod assistance.    Transfers Overall transfer level:  (deferred as she felt dizzy after dialysis session.)                      Ambulation/Gait                   Stairs             Wheelchair Mobility    Modified Rankin (Stroke Patients Only)       Balance                                            Cognition Arousal/Alertness: Awake/alert, Lethargic (from dialysis) Behavior During Therapy: WFL for tasks assessed/performed Overall Cognitive Status: Within Functional Limits for tasks assessed                                          Exercises General Exercises - Lower Extremity Ankle Circles/Pumps: AROM, Left, 10 reps, Supine Quad Sets: Strengthening, Both, 10 reps, Supine Heel Slides: AROM, AAROM, Both, 10 reps, Supine, Limitations Heel Slides Limitations: modified knee flexion on R due to BKA Hip ABduction/ADduction: Strengthening, Both, 10 reps, Supine, AAROM, AROM Straight Leg Raises: Strengthening, Both, 10 reps, Supine, AAROM, AROM    General Comments        Pertinent Vitals/Pain Pain Assessment Pain Assessment: Faces Faces  Pain Scale: Hurts even more Pain Location: LLE Pain Descriptors / Indicators: Throbbing Pain Intervention(s): Monitored during session, Repositioned    Home Living                          Prior Function            PT Goals (current goals can now be found in the care plan section) Acute Rehab PT Goals Patient Stated Goal: Get well, be able to walk again. Potential to Achieve Goals: Good Progress towards PT goals: Progressing toward goals    Frequency    Min 3X/week      PT Plan Current plan remains appropriate    Co-evaluation              AM-PAC PT "6 Clicks" Mobility   Outcome Measure  Help needed turning from your back to your side while in a flat bed  without using bedrails?: A Little Help needed moving from lying on your back to sitting on the side of a flat bed without using bedrails?: A Little Help needed moving to and from a bed to a chair (including a wheelchair)?: A Lot Help needed standing up from a chair using your arms (e.g., wheelchair or bedside chair)?: Total Help needed to walk in hospital room?: Total Help needed climbing 3-5 steps with a railing? : Total 6 Click Score: 11    End of Session   Activity Tolerance: Patient tolerated treatment well Patient left: in bed;with call bell/phone within reach;with bed alarm set Nurse Communication: Mobility status PT Visit Diagnosis: Muscle weakness (generalized) (M62.81);Difficulty in walking, not elsewhere classified (R26.2);Pain Pain - Right/Left: Left Pain - part of body: Leg     Time: 1610-9604 PT Time Calculation (min) (ACUTE ONLY): 15 min  Charges:  $Therapeutic Exercise: 8-22 mins                     Bonney Leitz , PTA Acute Rehabilitation Services Office [phone]    Florestine Avers 11/18/2022, 4:54 PM

## 2022-11-19 ENCOUNTER — Encounter (HOSPITAL_COMMUNITY): Payer: Self-pay | Admitting: Internal Medicine

## 2022-11-19 ENCOUNTER — Inpatient Hospital Stay (HOSPITAL_COMMUNITY): Admission: RE | Admit: 2022-11-19 | Payer: PPO | Source: Intra-hospital | Admitting: Physical Medicine & Rehabilitation

## 2022-11-19 DIAGNOSIS — N186 End stage renal disease: Secondary | ICD-10-CM | POA: Diagnosis not present

## 2022-11-19 DIAGNOSIS — T148XXA Other injury of unspecified body region, initial encounter: Secondary | ICD-10-CM | POA: Diagnosis not present

## 2022-11-19 DIAGNOSIS — L03116 Cellulitis of left lower limb: Secondary | ICD-10-CM | POA: Diagnosis not present

## 2022-11-19 DIAGNOSIS — E1142 Type 2 diabetes mellitus with diabetic polyneuropathy: Secondary | ICD-10-CM | POA: Diagnosis not present

## 2022-11-19 LAB — RENAL FUNCTION PANEL
Albumin: 2.5 g/dL — ABNORMAL LOW (ref 3.5–5.0)
Anion gap: 11 (ref 5–15)
BUN: 42 mg/dL — ABNORMAL HIGH (ref 8–23)
CO2: 25 mmol/L (ref 22–32)
Calcium: 8.4 mg/dL — ABNORMAL LOW (ref 8.9–10.3)
Chloride: 95 mmol/L — ABNORMAL LOW (ref 98–111)
Creatinine, Ser: 3.97 mg/dL — ABNORMAL HIGH (ref 0.44–1.00)
GFR, Estimated: 11 mL/min — ABNORMAL LOW (ref >60)
Glucose, Bld: 207 mg/dL — ABNORMAL HIGH (ref 70–99)
Phosphorus: 4.6 mg/dL (ref 2.5–4.6)
Potassium: 3.9 mmol/L (ref 3.5–5.1)
Sodium: 131 mmol/L — ABNORMAL LOW (ref 135–145)

## 2022-11-19 LAB — GLUCOSE, CAPILLARY
Glucose-Capillary: 200 mg/dL — ABNORMAL HIGH (ref 70–99)
Glucose-Capillary: 149 mg/dL — ABNORMAL HIGH (ref 70–99)
Glucose-Capillary: 108 mg/dL — ABNORMAL HIGH (ref 70–99)
Glucose-Capillary: 243 mg/dL — ABNORMAL HIGH (ref 70–99)

## 2022-11-19 LAB — CBC
HCT: 28 % — ABNORMAL LOW (ref 36.0–46.0)
Hemoglobin: 8.4 g/dL — ABNORMAL LOW (ref 12.0–15.0)
MCH: 24.2 pg — ABNORMAL LOW (ref 26.0–34.0)
MCHC: 30 g/dL (ref 30.0–36.0)
MCV: 80.7 fL (ref 80.0–100.0)
Platelets: 174 10*3/uL (ref 150–400)
RBC: 3.47 MIL/uL — ABNORMAL LOW (ref 3.87–5.11)
RDW: 16.8 % — ABNORMAL HIGH (ref 11.5–15.5)
WBC: 4.8 10*3/uL (ref 4.0–10.5)
nRBC: 0 % (ref 0.0–0.2)

## 2022-11-19 LAB — BLOOD GAS, VENOUS
Acid-Base Excess: 7 mmol/L — ABNORMAL HIGH (ref 0.0–2.0)
Bicarbonate: 33.5 mmol/L — ABNORMAL HIGH (ref 20.0–28.0)
Drawn by: 6364
O2 Saturation: 52.1 %
Patient temperature: 37.1
pCO2, Ven: 58 mmHg (ref 44–60)
pH, Ven: 7.37 (ref 7.25–7.43)
pO2, Ven: 31 mmHg — CL (ref 32–45)

## 2022-11-19 LAB — AEROBIC/ANAEROBIC CULTURE W GRAM STAIN (SURGICAL/DEEP WOUND)

## 2022-11-19 MED ORDER — HEPARIN SODIUM (PORCINE) 1000 UNIT/ML IJ SOLN
3800.0000 [IU] | Freq: Once | INTRAMUSCULAR | Status: AC
  Administered 2022-11-19: 3800 [IU]

## 2022-11-19 MED ORDER — LEVOTHYROXINE SODIUM 75 MCG PO TABS
75.0000 ug | ORAL_TABLET | Freq: Every day | ORAL | Status: DC
  Administered 2022-11-20: 75 ug via ORAL
  Filled 2022-11-19: qty 1

## 2022-11-19 MED ORDER — ATORVASTATIN CALCIUM 80 MG PO TABS
80.0000 mg | ORAL_TABLET | Freq: Every day | ORAL | Status: DC
  Administered 2022-11-19 – 2022-11-20 (×2): 80 mg via ORAL
  Filled 2022-11-19: qty 1

## 2022-11-19 MED ORDER — METOCLOPRAMIDE HCL 5 MG PO TABS: 5.0000 mg | ORAL_TABLET | Freq: Four times a day (QID) | ORAL | Status: DC | PRN

## 2022-11-19 MED ORDER — BISACODYL 5 MG PO TBEC
5.0000 mg | DELAYED_RELEASE_TABLET | Freq: Every day | ORAL | Status: DC | PRN
  Administered 2022-11-20: 5 mg via ORAL
  Filled 2022-11-19: qty 1

## 2022-11-19 MED ORDER — RAMELTEON 8 MG PO TABS
8.0000 mg | ORAL_TABLET | Freq: Every day | ORAL | Status: DC
  Administered 2022-11-19: 8 mg via ORAL
  Filled 2022-11-19 (×2): qty 1

## 2022-11-19 MED ORDER — PANTOPRAZOLE SODIUM 40 MG PO TBEC
40.0000 mg | DELAYED_RELEASE_TABLET | Freq: Two times a day (BID) | ORAL | Status: DC
  Administered 2022-11-19: 40 mg via ORAL
  Filled 2022-11-19: qty 1

## 2022-11-19 MED ORDER — SENNOSIDES-DOCUSATE SODIUM 8.6-50 MG PO TABS
2.0000 | ORAL_TABLET | Freq: Two times a day (BID) | ORAL | Status: DC
  Administered 2022-11-19 – 2022-11-20 (×2): 2 via ORAL
  Filled 2022-11-19 (×2): qty 2

## 2022-11-19 MED ORDER — NEPRO/CARBSTEADY PO LIQD
237.0000 mL | Freq: Two times a day (BID) | ORAL | Status: DC
  Administered 2022-11-19: 237 mL via ORAL

## 2022-11-19 MED ORDER — OXYCODONE HCL 5 MG PO TABS: 5.0000 mg | ORAL_TABLET | ORAL | Status: DC | PRN

## 2022-11-19 MED ORDER — HYDRALAZINE HCL 20 MG/ML IJ SOLN: 5.0000 mg | INTRAMUSCULAR | Status: DC | PRN

## 2022-11-19 MED ORDER — VENLAFAXINE HCL ER 75 MG PO CP24: 75.0000 mg | ORAL_CAPSULE | Freq: Every day | ORAL | Status: DC

## 2022-11-19 MED ORDER — FOLIC ACID 1 MG PO TABS
1.0000 mg | ORAL_TABLET | Freq: Every day | ORAL | Status: DC
  Administered 2022-11-19 – 2022-11-20 (×2): 1 mg via ORAL
  Filled 2022-11-19: qty 1

## 2022-11-19 MED ORDER — SORBITOL 70 % SOLN
30.0000 mL | Status: DC | PRN
  Administered 2022-11-20: 30 mL via ORAL
  Filled 2022-11-19 (×3): qty 30

## 2022-11-19 MED ORDER — ZOLPIDEM TARTRATE 5 MG PO TABS: 5.0000 mg | ORAL_TABLET | Freq: Every evening | ORAL | Status: DC | PRN

## 2022-11-19 MED ORDER — HEPARIN SODIUM (PORCINE) 1000 UNIT/ML DIALYSIS: 20.0000 [IU]/kg | INTRAMUSCULAR | Status: DC | PRN

## 2022-11-19 MED ORDER — HYDROMORPHONE HCL 1 MG/ML IJ SOLN: 0.5000 mg | INTRAMUSCULAR | Status: DC | PRN

## 2022-11-19 MED ORDER — HYDROXYZINE HCL 25 MG PO TABS: 25.0000 mg | ORAL_TABLET | Freq: Three times a day (TID) | ORAL | Status: DC | PRN

## 2022-11-19 MED ORDER — HEPARIN SODIUM (PORCINE) 1000 UNIT/ML DIALYSIS: 1000.0000 [IU] | INTRAMUSCULAR | Status: DC | PRN

## 2022-11-19 MED ORDER — ACETAMINOPHEN 325 MG PO TABS
650.0000 mg | ORAL_TABLET | Freq: Four times a day (QID) | ORAL | Status: DC | PRN
  Administered 2022-11-20: 650 mg via ORAL
  Filled 2022-11-19: qty 2

## 2022-11-19 MED ORDER — CEFAZOLIN SODIUM-DEXTROSE 2-4 GM/100ML-% IV SOLN
2.0000 g | Freq: Once | INTRAVENOUS | Status: AC
  Administered 2022-11-19: 2 g via INTRAVENOUS

## 2022-11-19 MED ORDER — LORAZEPAM 0.5 MG PO TABS: 0.5000 mg | ORAL_TABLET | Freq: Every day | ORAL | Status: DC

## 2022-11-19 MED ORDER — POLYETHYLENE GLYCOL 3350 17 G PO PACK
17.0000 g | PACK | Freq: Every day | ORAL | Status: DC
  Administered 2022-11-19 – 2022-11-20 (×2): 17 g via ORAL
  Filled 2022-11-19: qty 1

## 2022-11-19 MED ORDER — CAMPHOR-MENTHOL 0.5-0.5 % EX LOTN
1.0000 | TOPICAL_LOTION | Freq: Three times a day (TID) | CUTANEOUS | Status: DC | PRN
  Filled 2022-11-19: qty 222

## 2022-11-19 MED ORDER — BISACODYL 10 MG RE SUPP: 10.0000 mg | Freq: Every day | RECTAL | Status: DC | PRN

## 2022-11-19 MED ORDER — CEFAZOLIN SODIUM-DEXTROSE 2-4 GM/100ML-% IV SOLN
2.0000 g | Freq: Once | INTRAVENOUS | Status: DC
  Filled 2022-11-19: qty 100

## 2022-11-19 MED ORDER — CALCIUM CARBONATE ANTACID 1250 MG/5ML PO SUSP
500.0000 mg | Freq: Four times a day (QID) | ORAL | Status: DC | PRN
  Administered 2022-11-20: 500 mg via ORAL
  Filled 2022-11-19 (×2): qty 5

## 2022-11-19 MED ORDER — OXYCODONE HCL 5 MG PO TABS: 5.0000 mg | ORAL_TABLET | ORAL | 0 refills | Status: DC | PRN

## 2022-11-19 MED ORDER — ZINC SULFATE 220 (50 ZN) MG PO CAPS: 220.0000 mg | ORAL_CAPSULE | Freq: Every day | ORAL | Status: DC

## 2022-11-19 MED ORDER — RAMELTEON 8 MG PO TABS: 8.0000 mg | ORAL_TABLET | Freq: Every day | ORAL | Status: DC

## 2022-11-19 MED ORDER — SEVELAMER CARBONATE 800 MG PO TABS
1600.0000 mg | ORAL_TABLET | Freq: Three times a day (TID) | ORAL | Status: DC
  Administered 2022-11-19 – 2022-11-20 (×2): 1600 mg via ORAL
  Filled 2022-11-19: qty 2

## 2022-11-19 MED ORDER — DOCUSATE SODIUM 283 MG RE ENEM
1.0000 | ENEMA | RECTAL | Status: DC | PRN
  Filled 2022-11-19: qty 1

## 2022-11-19 MED ORDER — METOCLOPRAMIDE HCL 5 MG PO TABS
5.0000 mg | ORAL_TABLET | Freq: Three times a day (TID) | ORAL | Status: DC | PRN
  Administered 2022-11-20: 5 mg via ORAL
  Filled 2022-11-19: qty 2

## 2022-11-19 MED ORDER — POLYETHYLENE GLYCOL 3350 17 G PO PACK: 17.0000 g | PACK | Freq: Every day | ORAL | Status: DC | PRN

## 2022-11-19 MED ORDER — LIDOCAINE HCL (PF) 1 % IJ SOLN: 5.0000 mL | INTRAMUSCULAR | Status: DC | PRN

## 2022-11-19 MED ORDER — METHOCARBAMOL 500 MG PO TABS: 500.0000 mg | ORAL_TABLET | Freq: Four times a day (QID) | ORAL | Status: DC | PRN

## 2022-11-19 MED ORDER — CHLORHEXIDINE GLUCONATE CLOTH 2 % EX PADS
6.0000 | MEDICATED_PAD | Freq: Every day | CUTANEOUS | Status: DC
  Administered 2022-11-20: 6 via TOPICAL

## 2022-11-19 MED ORDER — VENLAFAXINE HCL ER 75 MG PO CP24
75.0000 mg | ORAL_CAPSULE | Freq: Every day | ORAL | Status: DC
  Administered 2022-11-20: 75 mg via ORAL
  Filled 2022-11-19: qty 1

## 2022-11-19 MED ORDER — VITAMIN C 500 MG PO TABS
500.0000 mg | ORAL_TABLET | Freq: Every day | ORAL | Status: DC
  Administered 2022-11-19 – 2022-11-20 (×2): 500 mg via ORAL
  Filled 2022-11-19: qty 1

## 2022-11-19 MED ORDER — DOCUSATE SODIUM 283 MG RE ENEM: 1.0000 | ENEMA | RECTAL | 0 refills | Status: DC | PRN

## 2022-11-19 MED ORDER — ACETAMINOPHEN 500 MG PO TABS
1000.0000 mg | ORAL_TABLET | Freq: Three times a day (TID) | ORAL | Status: DC
  Administered 2022-11-19 – 2022-11-20 (×2): 1000 mg via ORAL
  Filled 2022-11-19 (×2): qty 2

## 2022-11-19 MED ORDER — INSULIN ASPART 100 UNIT/ML IJ SOLN
0.0000 [IU] | Freq: Three times a day (TID) | INTRAMUSCULAR | Status: DC
  Administered 2022-11-20: 2 [IU] via SUBCUTANEOUS
  Administered 2022-11-20: 1 [IU] via SUBCUTANEOUS

## 2022-11-19 MED ORDER — LIDOCAINE-PRILOCAINE 2.5-2.5 % EX CREA: 1.0000 | TOPICAL_CREAM | CUTANEOUS | Status: DC | PRN

## 2022-11-19 MED ORDER — ALTEPLASE 2 MG IJ SOLR: 2.0000 mg | Freq: Once | INTRAMUSCULAR | Status: DC | PRN

## 2022-11-19 MED ORDER — SODIUM CHLORIDE 0.9 % IV SOLN: INTRAVENOUS | Status: DC

## 2022-11-19 MED ORDER — AMIODARONE HCL 200 MG PO TABS
200.0000 mg | ORAL_TABLET | Freq: Every day | ORAL | Status: DC
  Administered 2022-11-19 – 2022-11-20 (×2): 200 mg via ORAL
  Filled 2022-11-19: qty 1

## 2022-11-19 MED ORDER — APIXABAN 5 MG PO TABS
5.0000 mg | ORAL_TABLET | Freq: Two times a day (BID) | ORAL | Status: DC
  Administered 2022-11-19: 5 mg via ORAL
  Filled 2022-11-19 (×2): qty 1

## 2022-11-19 MED ORDER — CLOTRIMAZOLE 2 % VA CREA
1.0000 | TOPICAL_CREAM | Freq: Every day | VAGINAL | Status: DC
  Administered 2022-11-20: 1 via VAGINAL
  Filled 2022-11-19 (×2): qty 21

## 2022-11-19 MED ORDER — METOCLOPRAMIDE HCL 5 MG/ML IJ SOLN: 5.0000 mg | Freq: Three times a day (TID) | INTRAMUSCULAR | Status: DC | PRN

## 2022-11-19 MED ORDER — INSULIN GLARGINE-YFGN 100 UNIT/ML ~~LOC~~ SOLN
20.0000 [IU] | Freq: Every day | SUBCUTANEOUS | Status: DC
  Administered 2022-11-20: 20 [IU] via SUBCUTANEOUS
  Filled 2022-11-19 (×2): qty 0.2

## 2022-11-19 MED ORDER — ZINC SULFATE 220 (50 ZN) MG PO CAPS
220.0000 mg | ORAL_CAPSULE | Freq: Every day | ORAL | Status: DC
  Administered 2022-11-19 – 2022-11-20 (×2): 220 mg via ORAL
  Filled 2022-11-19: qty 1

## 2022-11-19 MED ORDER — CAMPHOR-MENTHOL 0.5-0.5 % EX LOTN: 1.0000 | TOPICAL_LOTION | Freq: Three times a day (TID) | CUTANEOUS | 0 refills | Status: DC | PRN

## 2022-11-19 MED ORDER — ACETAMINOPHEN 650 MG RE SUPP: 650.0000 mg | Freq: Four times a day (QID) | RECTAL | Status: DC | PRN

## 2022-11-19 MED ORDER — PROSOURCE PLUS PO LIQD
30.0000 mL | Freq: Two times a day (BID) | ORAL | Status: DC
  Administered 2022-11-19 – 2022-11-20 (×2): 30 mL via ORAL
  Filled 2022-11-19 (×2): qty 30

## 2022-11-19 MED ORDER — IPRATROPIUM-ALBUTEROL 0.5-2.5 (3) MG/3ML IN SOLN: 3.0000 mL | Freq: Four times a day (QID) | RESPIRATORY_TRACT | Status: DC | PRN

## 2022-11-19 MED ORDER — INSULIN GLARGINE-YFGN 100 UNIT/ML ~~LOC~~ SOLN
40.0000 [IU] | Freq: Every day | SUBCUTANEOUS | Status: DC
  Administered 2022-11-20: 40 [IU] via SUBCUTANEOUS
  Filled 2022-11-19: qty 0.4

## 2022-11-19 MED ORDER — PENTAFLUOROPROP-TETRAFLUOROETH EX AERO: 1.0000 | INHALATION_SPRAY | CUTANEOUS | Status: DC | PRN

## 2022-11-19 MED ORDER — INSULIN ASPART 100 UNIT/ML IJ SOLN
0.0000 [IU] | Freq: Every day | INTRAMUSCULAR | Status: DC
  Administered 2022-11-19: 2 [IU] via SUBCUTANEOUS

## 2022-11-19 MED ORDER — ANTICOAGULANT SODIUM CITRATE 4% (200MG/5ML) IV SOLN: 5.0000 mL | Status: DC | PRN

## 2022-11-19 MED ORDER — RENA-VITE PO TABS
1.0000 | ORAL_TABLET | Freq: Every day | ORAL | Status: DC
  Administered 2022-11-19: 1 via ORAL
  Filled 2022-11-19: qty 1

## 2022-11-19 MED ORDER — PREGABALIN 75 MG PO CAPS: 150.0000 mg | ORAL_CAPSULE | Freq: Two times a day (BID) | ORAL | Status: DC

## 2022-11-19 MED ORDER — MIDODRINE HCL 5 MG PO TABS
10.0000 mg | ORAL_TABLET | ORAL | Status: DC
  Administered 2022-11-20: 10 mg via ORAL
  Filled 2022-11-19 (×2): qty 2

## 2022-11-19 NOTE — Progress Notes (Signed)
Pt returned from HD with plans to xfr to CIR, CIR nurse and PA present for eval and to xfr when noted a change in mental status and jerking movements to BUE. 1315 BP noted to be low, see flowsheet doc. 1327 MD notified of recent BPs with instruction to give NaCl gentle bolus started at 1340. RRT called for assessment. Pt slowly becoming more alert and BP improving after bolus infusing via right FA PIV. Dr Tyson Babinski assessed patient at bedside, orders rec'd for ABGs.  1611 rec'd call from [facility] with lab of critical pO2 of 31, notification given at 1618 to Dr Tyson Babinski of recent lab results. BP has become more at patients baseline, continuing to monitor and document in flowsheet accordingly.

## 2022-11-19 NOTE — Progress Notes (Signed)
PT Cancellation Note  Patient Details Name: Susan [name] MRN: [id number] DOB: 1950-09-15   Cancelled Treatment:    Reason Eval/Treat Not Completed: Medical issues which prohibited therapy (Pt with medical issues, soft BP and twitching in arm unable to get blood gas draws.  Twitching is new and RN deferred tx at this time.)   Florestine Avers 11/19/2022, 4:15 PM  Bonney Leitz , PTA Acute Rehabilitation Services Office [phone]

## 2022-11-19 NOTE — Consult Note (Signed)
[facility] Customer service manager [facility]) Accountable Care Organization (ACO) [facility] Liaison Note  11/19/2022  Susan [name] 1950/12/14 [id number]  Location: [facility] RN Hospital Liaison screened the patient remotely at [facility].  Insurance: [employer]   Susan [name] is a 72 y.o. female who is a Primary Care Patient of Donita Brooks, MD. The patient was screened for readmission hospitalization with noted extreme risk score for unplanned readmission risk with 4 IP/ 1 ED in 6 months.  The patient was assessed for potential Care Management service needs for post hospital transition for care coordination. Review of patient's electronic medical record reveals patient was admitted for Cellulitis wound infection. Pt will be discharged to CIR for ongoing rehab. [facility] will continue to address pt's ongoing needs. Pt is a pt of [facility] for case management noted in [facility].   Care Management/Population Health does not replace or interfere with any arrangements made by the Inpatient Transition of Care team.   For questions contact:   Elliot Cousin, RN, [facility] Liaison [place]   Population Health Office Hours MTWF  8:00 am-6:00 pm Off on Thursday [phone] mobile [phone] [Office toll free line] Office Hours are M-F 8:30 - 5 pm 24 hour nurse advise line [phone] Concierge  [email or web address]

## 2022-11-19 NOTE — Progress Notes (Signed)
[facility] Progress Note   Subjective:  Seen in dialysis unit. Extra HD today. Remains on supp O2. Intermittent dizzy spells.   Objective Vitals:   11/19/22 0830 11/19/22 0900 11/19/22 0930 11/19/22 1000  BP: (!) 120/48 (!) 129/50 (!) 131/56 (!) 130/40  Pulse: 76 68 74 74  Resp: 14 19 13 20   Temp:      TempSrc:      SpO2: 93% 100% 100% 100%  Weight:      Height:       Physical Exam General: chronically ill appearing, on nasal oxygen Heart: RRR, no mrg Lungs: Clear bilaterally  Abdomen:soft, NTND Extremities: L leg dressed, wound vac in place, 1+ edema in hips, R BKA Dialysis Access: R TDC, L AVF no bruit   Filed Weights   11/15/22 0858 11/18/22 1253 11/19/22 0737  Weight: 100 kg 97.2 kg 100.5 kg    Intake/Output Summary (Last 24 hours) at 11/19/2022 1013 Last data filed at 11/18/2022 1253 Gross per 24 hour  Intake --  Output 1500 ml  Net -1500 ml     Additional Objective Labs: Basic Metabolic Panel: Recent Labs  Lab 11/17/22 0739 11/18/22 0151 11/19/22 0205  NA 133* 134* 131*  K 3.9 4.2 3.9  CL 98 97* 95*  CO2 21* 23 25  GLUCOSE 80 85 207*  BUN 60* 70* 42*  CREATININE 4.84* 5.43* 3.97*  CALCIUM 9.0 8.7* 8.4*  PHOS 6.4* 6.5* 4.6    Liver Function Tests: Recent Labs  Lab 11/17/22 0739 11/18/22 0151 11/19/22 0205  ALBUMIN 2.8* 2.6* 2.5*     CBC: Recent Labs  Lab 11/14/22 0702 11/15/22 0415 11/16/22 0152 11/17/22 0739 11/18/22 0151 11/19/22 0205  WBC 5.8 4.8 5.3 5.5 5.7 4.8  NEUTROABS 3.8 3.9  --  3.7  --   --   HGB 9.0* 8.3* 8.5* 9.1* 8.7* 8.4*  HCT 29.3* 26.9* 27.0* 30.3* 29.2* 28.0*  MCV 80.5 77.3* 78.3* 81.5 79.3* 80.7  PLT 151 157 159 171 167 174    CBG: Recent Labs  Lab 11/18/22 0756 11/18/22 1337 11/18/22 1624 11/18/22 2005 11/19/22 0257  GLUCAP 196* 147* 223* 228* 200*     Medications:  sodium chloride     albumin human 25 g (11/15/22 0940)   anticoagulant sodium citrate      ceFAZolin (ANCEF) IV  2 g (11/18/22 1715)   methocarbamol (ROBAXIN) IV      (feeding supplement) PROSource Plus  30 mL Oral BID BM   acetaminophen  1,000 mg Oral TID   amiodarone  200 mg Oral Daily   apixaban  5 mg Oral BID   ascorbic acid  500 mg Oral Daily   atorvastatin  80 mg Oral Daily   Chlorhexidine Gluconate Cloth  6 each Topical Q0600   clotrimazole  1 Applicatorful Vaginal QHS   feeding supplement (NEPRO CARB STEADY)  237 mL Oral BID BM   folic acid  1 mg Oral Daily   insulin aspart  0-5 Units Subcutaneous QHS   insulin aspart  0-6 Units Subcutaneous TID WC   insulin glargine-yfgn  20 Units Subcutaneous QHS   insulin glargine-yfgn  40 Units Subcutaneous Daily   levothyroxine  75 mcg Oral QAC breakfast   LORazepam  0.5 mg Oral QHS   midodrine  10 mg Oral Q T,Th,Sa-HD   multivitamin  1 tablet Oral QHS   pantoprazole  40 mg Oral BID AC   polyethylene glycol  17 g Oral Daily   pregabalin  150 mg Oral BID   ramelteon  8 mg Oral QHS   senna-docusate  2 tablet Oral BID   sevelamer carbonate  1,600 mg Oral TID WC   venlafaxine XR  75 mg Oral Q breakfast   zinc sulfate  220 mg Oral Daily    Dialysis Orders: [facility] TTS 4:00 400/800 EDW 91.5kg 2K/2Ca TDC Heparin 4000 -Mircera 75  - last 6/8 -Calcitriol 0.5 three times per week     Assessment/Plan: Infected left lower extremity wound - maggot infestation on admission. Blood cultures neg. Ortho following - s/p OR debridement on 6/21, now with wound vac. Wound cultures+ proteus/klebsiella --Continue cefazolin 2g qHD until 11/28/22 ESRD -  HD TTS. Extra HD today for additional volume removal. Back on schedule tomorrow.  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 For access surgery next month  Hypertension/volume  - BP controlled. Does have some volume on exam. UF as able.  Anemia  - Hgb 8.5. Received Aranesp 150 on 6/23.  Metabolic bone disease -  Ca/Phos acceptable. Continue sevelamer binders with diet.  Nutrition - Renal diet with fluid restriction.  T2DM -  historically poorly controlled. Insulin per primary Dispo - For CIR admit   Susan Blase PA-C [facility] 11/19/2022,10:13 AM

## 2022-11-19 NOTE — Progress Notes (Addendum)
Inpatient Rehabilitation Admissions Coordinator   I have insurance approval and CIR bed to admit her to today. Acute team and TOC made aware. I will make the arrangements. I contacted Reita Cliche, at [facility] to follow up with patient at [facility] for fitting of prostheses. She has only had Casting on 6/3 .  Ottie Glazier, RN, MSN Rehab Admissions Coordinator [phone] 11/19/2022 10:18 AM

## 2022-11-19 NOTE — Progress Notes (Signed)
Received patient in bed to unit.  Alert and oriented.  Informed consent signed and in chart.   TX duration:3  Patient tolerated well.  Transported back to the room  Alert, without acute distress.  Hand-off given to patient's nurse.   Access used: right  Access issues: TDC  Total UF removed: 3L Medication(s) given: OXY   11/19/22 1054  Vitals  Temp 98.1 F (36.7 C)  Temp Source Oral  BP (!) 95/45  MAP (mmHg) (!) 58  BP Location Right Arm  BP Method Automatic  Patient Position (if appropriate) Lying  Pulse Rate 65  Pulse Rate Source Monitor  ECG Heart Rate 65  Resp 17  Oxygen Therapy  SpO2 100 %  O2 Device Nasal Cannula  O2 Flow Rate (L/min) 3 L/min  Patient Activity (if Appropriate) In bed  Pulse Oximetry Type Continuous  During Treatment Monitoring  HD Safety Checks Performed Yes  Intra-Hemodialysis Comments Tx completed;Tolerated well  Dialysis Fluid Bolus Normal Saline  Bolus Amount (mL) 300 mL  Post Treatment  Dialyzer Clearance Lightly streaked  Duration of HD Treatment -hour(s) 3 hour(s)  Hemodialysis Intake (mL) 0 mL  Liters Processed 71.7  Fluid Removed (mL) 3000 mL  Tolerated HD Treatment Yes  Post-Hemodialysis Comments Tx completed. Tolerated well. Genice [facility]

## 2022-11-19 NOTE — Progress Notes (Signed)
Pt arrived to HD unit with dressing off of her LLE. Per Pt Dr. Henreitta Cea removed the dressing while being transported to HD. Dressing applied in Hd.

## 2022-11-19 NOTE — Progress Notes (Signed)
PROGRESS NOTE    Susan [name]  WUJ:[id number] DOB: [birth year] DOA: 11/10/2022 PCP: Donita Brooks, MD    No chief complaint on file.   Brief Narrative:  72yo female with h/o CAD, chronic diastolic CHF, HLD, TTS HD, HTN, PVD s/p R BKA, hypothyroidism, morbid obesity, afib, and DM presented with maggot infestation of her LLE. Dr. Lajoyce Corners sent her to the ER for plans of surgical debridement..  Assessment & Plan:   Principal Problem:   Complicated wound infection Active Problems:   Type 2 diabetes mellitus with diabetic polyneuropathy, with long-term current use of insulin (HCC)   ESRD on dialysis (HCC)   Paroxysmal atrial fibrillation (HCC)   Hyperlipemia   Hypotension   Impaired ambulation   Hypothyroidism   Prolonged QT interval   Obesity, Class III, BMI 40-49.9 (morbid obesity) (HCC)   S/P BKA (below knee amputation) unilateral, right (HCC)   Maggot infestation   Cellulitis of left lower extremity  Left lower extremity wound infection/necrotic wound lateral left leg with maggots Had failed outpatient treatment.  Was seen in consultation with orthopedics. status post excisional debridement of lower extremity with application of wound VAC, 11/14/2022, cultures sent and growing Proteus mirabilis and Klebsiella pneumoniae. Patient was on IV Vanco, IV cefepime, IV Flagyl.   -IV vancomycin discontinued 11/16/2022.  ID  narrowed to IV cefazolin with end date 11/29/2022.    ESRD on HD TTS Patient noted with thrombosed aVF felt not salvageable per nephrology and patient for planned outpatient follow-up in 3 to 4 weeks.  Continue hemodialysis as per nephrology.  Diabetes mellitus type 2 Hemoglobin A1c 7.9 (08/27/2022). Continue sliding scale insulin and long-acting insulin.   Atrial fibrillation -Continue amiodarone,Eliquis.  Rate controlled    History of right renal infarct, splenic infarct -In the setting of underlying A-fib Eliquis has been resumed  Hypothyroidism Continue  Synthroid.   Hypotension -Midodrine.   PVD status post right BKA -Continue statin and Eliquis.  Morbid obesity -BMI 41.77.  Would benefit from weight loss as outpatient.  Ambulatory dysfunction -Patient noted unable to bear weight to assist with transfers since BKA. -Being fitted for prosthesis. Plan for CIR at this time.   Prolonged QTc -Repeat EKG with resolution of QTc prolongation.   MDD/GAD with panic disorder. Followed by psychiatry during hospitalization. Was on vortioxetine and was transitioned to venlafaxine per psychiatry. Patient feels new antidepressant medication working much better than prior one. -Psychiatry  signed off as of 11/14/2022.   Constipation Continue bowel regimen on discharge.      Shortness of breath Chest x-ray without acute abnormality.  On hemodialysis for volume management.  On supplemental oxygen.  Stage II pressure injury of the sacrum, POA Pressure Injury 08/27/22 Coccyx Mid Stage 2 -  Partial thickness loss of dermis presenting as a shallow open injury with a red, pink wound bed without slough. 38mmx5mmx0 red center unattached edges (Active)  08/27/22 1000  Location: Coccyx  Location Orientation: Mid  Staging: Stage 2 -  Partial thickness loss of dermis presenting as a shallow open injury with a red, pink wound bed without slough.  Wound Description (Comments): 50mmx5mmx0 red center unattached edges  Present on Admission:     Continue wound care on discharge.    DVT prophylaxis: Eliquis resumed 11/15/2022.    Code Status: Full  Family Communication: None at bedside.  Disposition: CIR when bed available.  Status is: Inpatient Remains inpatient appropriate because: Severity of illness   Consultants:  WOC RN: Ria Bush, RN  11/10/2022 Nephrology: Dr. Signe Colt 11/11/2022 Psychiatry: Dr. Gasper Sells 11/11/2022 Orthopedics: Dr. Lajoyce Corners 11/12/2022 ID: Dr. Thedore Mins 11/17/2022  Procedures:  Excisional debridement of lower extremity wound with  excision skin and soft tissue muscle and fascia/application of wound VAC/application of Kerecis micro graft 95 cm per orthopedics: Dr. Lajoyce Corners 11/14/2022  Antimicrobials:  Anti-infectives (From admission, onward)    Start     Dose/Rate Route Frequency Ordered Stop   11/18/22 1800  ceFAZolin (ANCEF) IVPB 2g/100 mL premix        2 g 200 mL/hr over 30 Minutes Intravenous Every T-Th-Sa (1800) 11/17/22 1139     11/18/22 0000  ceFAZolin (ANCEF) IVPB        2 g Intravenous Every T-Th-Sa (Hemodialysis) 11/17/22 1150 11/28/22 2359   11/17/22 1230  ceFAZolin (ANCEF) IVPB 1 g/50 mL premix        1 g 100 mL/hr over 30 Minutes Intravenous  Once 11/17/22 1139 11/17/22 1245   11/15/22 1800  ceFEPIme (MAXIPIME) 2 g in sodium chloride 0.9 % 100 mL IVPB  Status:  Discontinued        2 g 200 mL/hr over 30 Minutes Intravenous Every T-Th-Sa (1800) 11/14/22 1233 11/17/22 1139   11/14/22 2300  vancomycin (VANCOCIN) IVPB 1000 mg/200 mL premix        1,000 mg 200 mL/hr over 60 Minutes Intravenous  Once 11/14/22 2207 11/15/22 0021   11/14/22 1500  vancomycin (VANCOCIN) IVPB 1000 mg/200 mL premix  Status:  Discontinued        1,000 mg 200 mL/hr over 60 Minutes Intravenous  Once 11/14/22 1433 11/14/22 2207   11/14/22 1400  metroNIDAZOLE (FLAGYL) IVPB 500 mg  Status:  Discontinued        500 mg 100 mL/hr over 60 Minutes Intravenous Every 12 hours 11/14/22 1221 11/17/22 1252   11/14/22 1400  ceFEPIme (MAXIPIME) 2 g in sodium chloride 0.9 % 100 mL IVPB        2 g 200 mL/hr over 30 Minutes Intravenous  Once 11/14/22 1233 11/15/22 1857   11/14/22 0923  vancomycin (VANCOCIN) powder  Status:  Discontinued          As needed 11/14/22 0924 11/14/22 0930   11/14/22 0745  vancomycin (VANCOCIN) IVPB 1000 mg/200 mL premix  Status:  Discontinued        1,000 mg 200 mL/hr over 60 Minutes Intravenous On call to O.R. 11/14/22 0631 11/14/22 1032   11/14/22 0730  levofloxacin (LEVAQUIN) IVPB 500 mg  Status:  Discontinued         500 mg 100 mL/hr over 60 Minutes Intravenous On call to O.R. 11/14/22 0631 11/14/22 1032   11/13/22 1730  fluconazole (DIFLUCAN) tablet 150 mg        150 mg Oral  Once 11/13/22 1630 11/13/22 1707   11/12/22 1145  vancomycin (VANCOCIN) IVPB 1000 mg/200 mL premix  Status:  Discontinued        1,000 mg 200 mL/hr over 60 Minutes Intravenous On call to O.R. 11/12/22 1052 11/12/22 1055   11/12/22 1145  levofloxacin (LEVAQUIN) IVPB 500 mg  Status:  Discontinued        500 mg 100 mL/hr over 60 Minutes Intravenous On call to O.R. 11/12/22 1052 11/12/22 1055   11/12/22 1054  vancomycin (VANCOCIN) 1-5 GM/200ML-% IVPB  Status:  Discontinued       Note to Pharmacy: [facility] [place]: cabinet override      11/12/22 1054 11/12/22 1114   11/12/22 0815  vancomycin (VANCOCIN) IVPB 1000 mg/200  mL premix        1,000 mg 200 mL/hr over 60 Minutes Intravenous  Once 11/12/22 0718 11/12/22 2040   11/11/22 1354  vancomycin (VANCOCIN) IVPB 1000 mg/200 mL premix  Status:  Discontinued        1,000 mg 200 mL/hr over 60 Minutes Intravenous Every T-Th-Sa (Hemodialysis) 11/11/22 1354 11/16/22 1645   11/10/22 1700  piperacillin-tazobactam (ZOSYN) IVPB 2.25 g  Status:  Discontinued        2.25 g 100 mL/hr over 30 Minutes Intravenous Every 8 hours 11/10/22 1645 11/14/22 1221   11/10/22 1645  vancomycin (VANCOREADY) IVPB 2000 mg/400 mL        2,000 mg 200 mL/hr over 120 Minutes Intravenous  Once 11/10/22 1632 11/11/22 1930       Subjective: Today, patient was seen and examined during hemodialysis.  Complains about pain in the leg asking for pain medication.  Denies any nausea vomiting fever chills or rigor.   Objective: Vitals:   11/19/22 0800 11/19/22 0830 11/19/22 0900 11/19/22 0930  BP: (!) 116/53 (!) 120/48 (!) 129/50 (!) 131/56  Pulse: 72 76 68 74  Resp: 15 14 19 13   Temp:      TempSrc:      SpO2: 94% 93% 100% 100%  Weight:      Height:        Intake/Output Summary (Last 24 hours) at 11/19/2022 1011 Last  data filed at 11/18/2022 1253 Gross per 24 hour  Intake --  Output 1500 ml  Net -1500 ml    Filed Weights   11/15/22 0858 11/18/22 1253 11/19/22 0737  Weight: 100 kg 97.2 kg 100.5 kg    Physical examination:  General exam: Alert awake and Communicative, Respiratory system: Decreased breath sounds bilaterally.   Cardiovascular system: RRR no murmur. Gastrointestinal system: Abdomen is soft, nontender, nondistended Central nervous system: Alert and oriented. No focal neurological deficits. Extremities: Left lower extremity bandaged  Status post right BKA. Skin: Left lower extremity wound with dressing. Psychiatry:  mildly anxious.    Data Reviewed: I have personally reviewed following labs and imaging studies  CBC: Recent Labs  Lab 11/13/22 0052 11/14/22 0702 11/15/22 0415 11/16/22 0152 11/17/22 0739 11/18/22 0151 11/19/22 0205  WBC 5.8 5.8 4.8 5.3 5.5 5.7 4.8  NEUTROABS 4.4 3.8 3.9  --  3.7  --   --   HGB 9.5* 9.0* 8.3* 8.5* 9.1* 8.7* 8.4*  HCT 30.7* 29.3* 26.9* 27.0* 30.3* 29.2* 28.0*  MCV 79.9* 80.5 77.3* 78.3* 81.5 79.3* 80.7  PLT 133* 151 157 159 171 167 174     Basic Metabolic Panel: Recent Labs  Lab 11/15/22 0415 11/16/22 0152 11/17/22 0739 11/18/22 0151 11/19/22 0205  NA 131* 132* 133* 134* 131*  K 3.5 3.5 3.9 4.2 3.9  CL 94* 96* 98 97* 95*  CO2 23 26 21* 23 25  GLUCOSE 461* 325* 80 85 207*  BUN 61* 42* 60* 70* 42*  CREATININE 4.33* 3.30* 4.84* 5.43* 3.97*  CALCIUM 8.5* 8.6* 9.0 8.7* 8.4*  PHOS 5.9* 4.1 6.4* 6.5* 4.6     GFR: Estimated Creatinine Clearance: 14.8 mL/min (A) (by C-G formula based on SCr of 3.97 mg/dL (H)).  Liver Function Tests: Recent Labs  Lab 11/15/22 0415 11/16/22 0152 11/17/22 0739 11/18/22 0151 11/19/22 0205  ALBUMIN 2.5* 2.6* 2.8* 2.6* 2.5*     CBG: Recent Labs  Lab 11/18/22 0756 11/18/22 1337 11/18/22 1624 11/18/22 2005 11/19/22 0257  GLUCAP 196* 147* 223* 228* 200*  Recent Results (from the  past 240 hour(s))  Blood Cultures x 2 sites     Status: None   Collection Time: 11/10/22 12:58 PM   Specimen: BLOOD RIGHT ARM  Result Value Ref Range Status   Specimen Description BLOOD RIGHT ARM  Final   Special Requests   Final    BOTTLES DRAWN AEROBIC AND ANAEROBIC Blood Culture results may not be optimal due to an inadequate volume of blood received in culture bottles   Culture   Final    NO GROWTH 5 DAYS Performed at [facility], [address] [place], [place] [address]    Report Status 11/15/2022 FINAL  Final  Blood Cultures x 2 sites     Status: None   Collection Time: 11/10/22  4:41 PM   Specimen: BLOOD RIGHT FOREARM  Result Value Ref Range Status   Specimen Description BLOOD RIGHT FOREARM  Final   Special Requests   Final    BOTTLES DRAWN AEROBIC AND ANAEROBIC Blood Culture adequate volume   Culture   Final    NO GROWTH 5 DAYS Performed at [facility], [address], [place], [place] [address]    Report Status 11/15/2022 FINAL  Final  Surgical pcr screen     Status: Abnormal   Collection Time: 11/11/22 11:31 PM   Specimen: Nasal Mucosa; Nasal Swab  Result Value Ref Range Status   MRSA, PCR NEGATIVE NEGATIVE Final   Staphylococcus aureus POSITIVE (A) NEGATIVE Final    Comment: (NOTE) The Xpert SA Assay (FDA approved for NASAL specimens in patients 31 years of age and older), is one component of a comprehensive surveillance program. It is not intended to diagnose infection nor to guide or monitor treatment. Performed at [facility], [address], [place], [place] [address]   Aerobic/Anaerobic Culture w Gram Stain (surgical/deep wound)     Status: None   Collection Time: 11/14/22  9:19 AM   Specimen: Soft Tissue, Other  Result Value Ref Range Status   Specimen Description TISSUE  Final   Special Requests NONE  Final   Gram Stain   Final    MODERATE WBC PRESENT,BOTH PMN AND MONONUCLEAR RARE SQUAMOUS EPITHELIAL CELLS PRESENT NO ORGANISMS  SEEN Performed at [facility], [address], [place], [place] [address]    Culture   Final    FEW PROTEUS MIRABILIS RARE KLEBSIELLA PNEUMONIAE NO ANAEROBES ISOLATED FEW ENTEROCOCCUS FAECALIS    Report Status 11/19/2022 FINAL  Final   Organism ID, Bacteria PROTEUS MIRABILIS  Final   Organism ID, Bacteria KLEBSIELLA PNEUMONIAE  Final   Organism ID, Bacteria ENTEROCOCCUS FAECALIS  Final      Susceptibility   Enterococcus faecalis - MIC*    AMPICILLIN <=2 SENSITIVE Sensitive     VANCOMYCIN 1 SENSITIVE Sensitive     GENTAMICIN SYNERGY SENSITIVE Sensitive     * FEW ENTEROCOCCUS FAECALIS   Klebsiella pneumoniae - MIC*    AMPICILLIN >=32 RESISTANT Resistant     CEFEPIME <=0.12 SENSITIVE Sensitive     CEFTAZIDIME <=1 SENSITIVE Sensitive     CEFTRIAXONE <=0.25 SENSITIVE Sensitive     CIPROFLOXACIN <=0.25 SENSITIVE Sensitive     GENTAMICIN <=1 SENSITIVE Sensitive     IMIPENEM <=0.25 SENSITIVE Sensitive     TRIMETH/SULFA <=20 SENSITIVE Sensitive     AMPICILLIN/SULBACTAM 8 SENSITIVE Sensitive     PIP/TAZO 8 SENSITIVE Sensitive     * RARE KLEBSIELLA PNEUMONIAE   Proteus mirabilis - MIC*  AMPICILLIN <=2 SENSITIVE Sensitive     CEFEPIME <=0.12 SENSITIVE Sensitive     CEFTAZIDIME <=1 SENSITIVE Sensitive     CEFTRIAXONE <=0.25 SENSITIVE Sensitive     CIPROFLOXACIN <=0.25 SENSITIVE Sensitive     GENTAMICIN <=1 SENSITIVE Sensitive     IMIPENEM 2 SENSITIVE Sensitive     TRIMETH/SULFA <=20 SENSITIVE Sensitive     AMPICILLIN/SULBACTAM <=2 SENSITIVE Sensitive     PIP/TAZO <=4 SENSITIVE Sensitive     * FEW PROTEUS MIRABILIS      Radiology Studies: DG CHEST PORT 1 VIEW  Result Date: 11/17/2022 CLINICAL DATA:  Shortness of breath EXAM: PORTABLE CHEST 1 VIEW COMPARISON:  Radiograph 09/01/2022 FINDINGS: Right IJ CVC tip in the low SVC. Stable cardiomegaly. Coronary stenting. Aortic atherosclerotic calcification. No focal consolidation, pleural effusion, or pneumothorax. No displaced  rib fractures. IMPRESSION: No active disease.  Cardiomegaly. Electronically Signed   By: Minerva Fester M.D.   On: 11/17/2022 20:38        Scheduled Meds:  (feeding supplement) PROSource Plus  30 mL Oral BID BM   acetaminophen  1,000 mg Oral TID   amiodarone  200 mg Oral Daily   apixaban  5 mg Oral BID   ascorbic acid  500 mg Oral Daily   atorvastatin  80 mg Oral Daily   Chlorhexidine Gluconate Cloth  6 each Topical Q0600   clotrimazole  1 Applicatorful Vaginal QHS   feeding supplement (NEPRO CARB STEADY)  237 mL Oral BID BM   folic acid  1 mg Oral Daily   insulin aspart  0-5 Units Subcutaneous QHS   insulin aspart  0-6 Units Subcutaneous TID WC   insulin glargine-yfgn  20 Units Subcutaneous QHS   insulin glargine-yfgn  40 Units Subcutaneous Daily   levothyroxine  75 mcg Oral QAC breakfast   LORazepam  0.5 mg Oral QHS   midodrine  10 mg Oral Q T,Th,Sa-HD   multivitamin  1 tablet Oral QHS   pantoprazole  40 mg Oral BID AC   polyethylene glycol  17 g Oral Daily   pregabalin  150 mg Oral BID   ramelteon  8 mg Oral QHS   senna-docusate  2 tablet Oral BID   sevelamer carbonate  1,600 mg Oral TID WC   venlafaxine XR  75 mg Oral Q breakfast   zinc sulfate  220 mg Oral Daily   Continuous Infusions:  sodium chloride     albumin human 25 g (11/15/22 0940)   anticoagulant sodium citrate      ceFAZolin (ANCEF) IV 2 g (11/18/22 1715)   methocarbamol (ROBAXIN) IV       LOS: 9 days    Joycelyn Das, MD [facility]  11/19/2022, 10:11 AM

## 2022-11-19 NOTE — Progress Notes (Signed)
Patient ID: Susan [name], female   DOB: 1950/12/03, 72 y.o.   MRN: [id number] Patient seen in follow-up status post debridement lateral left calf ulcer.  The wound VAC was removed yesterday.  Photograph shows good granulation tissue.  The wound measures 3 x 5 cm and 5 mm deep.  Patient will undergo serial compression dressing changes.  Plan for discharge to inpatient rehab.

## 2022-11-19 NOTE — PMR Pre-admission (Signed)
PMR Admission Coordinator Pre-Admission Assessment  Patient: Susan [name] is an 72 y.o., female MRN: [id number] DOB: Feb 06, 1951 Height: 5\' 5"  (165.1 cm) Weight: 100.5 kg (bed)  Insurance Information HMO: yes    PPO:      PCP:      IPA:      80/20:      OTHER:  PRIMARY: [employer]      Policy#: [id number]      Subscriber: pt CM Name: Junious Dresser      Phone#: [phone]     Fax#: [phone] [facility] access Pre-Cert#: [id number] approved for 7 days      Employer:  Benefits:  Phone #: [phone]     Name: 6/25 Eff. Date: 05/26/22     Deduct: none      Out of Pocket Max: $3500      Life Max: none CIR: $225 co pay per day days 1 until 6      SNF: no co pay per day days 1 until 20; $203 co pay per day days 21 until 100 Outpatient: $15 per visit     Co-Pay: visits per medical neccesity Home Health: 80%      Co-Pay: 20% DME: 80%     Co-Pay: 20% Providers: in network  Dr Logan Bores approved CIR admit to begin use of prosthetic leg, decrease burden from hoyer to transfers for outpatient hemodialysis, etc.  SECONDARY: none  Financial Counselor:       Phone#:   The Engineer, materials Information Summary" for patients in Inpatient Rehabilitation Facilities with attached "Privacy Act Statement-Health Care Records" was provided and verbally reviewed with: Patient and Family  Emergency Contact Information Contact Information     Name Relation Home Work Mobile   Crozier Spouse [phone]  [phone]   Leanna Battles Daughter [phone]  [phone]      Current Medical History  Patient Admitting Diagnosis: LLE complicated wound, Right BKA  History of Present Illness: 72 year old female with history of CAD, chronic diastolic CHF, HLD, ESRD on hemodialysis, PVD s/p Right BKA, hypothyroidism, afib and DM. Presented on 11/10/22  with maggot infestation of her LLE. 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 She went to SNF but has been unable to bear weight on her  left leg. Discharged home 4/24. Also with a sacral ulcer. Has seen Dr Lajoyce Corners as an outpatient with compression wrapped placed to LLE. Over the weekend of 6/15, she and spouse noticed maggot infestation of the wound and notified clinic. She was admitted 6/17 for surgical debridement of wound.  She was started on Vanc/Zosyn and Flagyl. Wound debridement by Dr Lajoyce Corners on 6/21 with Wound VAC applied. ESRD on hemodialysis managed by nephrology. On Semglee and SSI for DM management. Continue amiodarone for rate control. Eliquis resumed after debridement.Synthroid resumed as well as Midodrine. [facility] to be notified of admission for prosthetic fitting during CIR stay. Psychiatry consulted due to MDD/GAD with panic. On vortioxetine and to be transitioned to venlafaxine. WOC for sacral wound.   Patient's medical record from [facility] has been reviewed by the rehabilitation admission coordinator and physician.  Past Medical History  Past Medical History:  Diagnosis Date   Anemia    hx   Anxiety    Arthritis    "generalized" (03/15/2014)   CAD (coronary artery disease)    MI in 2000 - MI  2007 - treated bare metal stent (no nuclear since then as 9/11)   Carotid artery  disease (HCC)    Chronic diastolic heart failure (HCC)    a) ECHO (08/2013) EF 55-60% and RV function nl b) RHC (08/2013) RA 4, RV 30/5/7, PA 25/10 (16), PCWP 7, Fick CO/CI 6.3/2.7, PVR 1.5 WU, PA 61 and 66%   Daily headache    "~ every other day; since I fell in June" (03/15/2014)   Depression    Dyslipidemia    ESRD (end stage renal disease) (HCC)    Dialysis on Tues Thurs Sat   Exertional shortness of breath    History of kidney stones    HTN (hypertension)    Hypothyroidism    Obesity    Osteoarthritis    PAF (paroxysmal atrial fibrillation) (HCC)    Peripheral neuropathy    bilateral feet/hands   PONV (postoperative nausea and vomiting)    RBBB (right bundle branch block)    Old   Stroke (HCC)    mini  strokes   Type II diabetes mellitus (HCC)    Type II, [name]. patient has omnipod insulin pump with Novolin R Insulin   Has the patient had major surgery during 100 days prior to admission? Yes  Family History   family history includes Heart attack (age of onset: 76) in her mother.  Current Medications  Current Facility-Administered Medications:    (feeding supplement) PROSource Plus liquid 30 mL, 30 mL, Oral, BID BM, Nadara Mustard, MD, 30 mL at 11/18/22 1707   0.9 %  sodium chloride infusion, , Intravenous, Continuous, Nadara Mustard, MD   acetaminophen (TYLENOL) tablet 650 mg, 650 mg, Oral, Q6H PRN, 650 mg at 11/15/22 0319 **OR** acetaminophen (TYLENOL) suppository 650 mg, 650 mg, Rectal, Q6H PRN, Nadara Mustard, MD   acetaminophen (TYLENOL) tablet 1,000 mg, 1,000 mg, Oral, TID, Nadara Mustard, MD, 1,000 mg at 11/18/22 2138   acetaminophen (TYLENOL) tablet 325-650 mg, 325-650 mg, Oral, Q6H PRN, Nadara Mustard, MD   albumin human 25 % solution 25 g, 25 g, Intravenous, PRN, Penninger, Lindsay, PA, Last Rate: 60 mL/hr at 11/15/22 0940, 25 g at 11/15/22 0940   alteplase (CATHFLO ACTIVASE) injection 2 mg, 2 mg, Intracatheter, Once PRN, Ejigiri, Ogechi Grace, PA-C   alum & mag hydroxide-simeth (MAALOX/MYLANTA) 200-200-20 MG/5ML suspension 30 mL, 30 mL, Oral, Q6H PRN, Rodolph Bong, MD, 30 mL at 11/19/22 0514   amiodarone (PACERONE) tablet 200 mg, 200 mg, Oral, Daily, Nadara Mustard, MD, 200 mg at 11/17/22 1610   anticoagulant sodium citrate solution 5 mL, 5 mL, Intracatheter, PRN, Ejigiri, Ogechi Grace, PA-C   apixaban Everlene Balls) tablet 5 mg, 5 mg, Oral, BID, Rodolph Bong, MD, 5 mg at 11/18/22 2138   ascorbic acid (VITAMIN C) tablet 500 mg, 500 mg, Oral, Daily, Nadara Mustard, MD, 500 mg at 11/17/22 0801   atorvastatin (LIPITOR) tablet 80 mg, 80 mg, Oral, Daily, Nadara Mustard, MD, 80 mg at 11/17/22 0801   bisacodyl (DULCOLAX) EC tablet 5 mg, 5 mg, Oral, Daily  PRN, Nadara Mustard, MD   bisacodyl (DULCOLAX) suppository 10 mg, 10 mg, Rectal, Daily PRN, Nadara Mustard, MD   calcium carbonate (dosed in mg elemental calcium) suspension 500 mg of elemental calcium, 500 mg of elemental calcium, Oral, Q6H PRN, Nadara Mustard, MD, 500 mg of elemental calcium at 11/12/22 1929   camphor-menthol (SARNA) lotion 1 Application, 1 Application, Topical, Q8H PRN **AND** hydrOXYzine (ATARAX) tablet 25 mg, 25 mg, Oral, Q8H PRN, Nadara Mustard, MD, 25  mg at 11/19/22 0634   ceFAZolin (ANCEF) IVPB 2g/100 mL premix, 2 g, Intravenous, Q T,Th,Sat-1800, Danelle Earthly, MD, Last Rate: 200 mL/hr at 11/18/22 1715, 2 g at 11/18/22 1715   Chlorhexidine Gluconate Cloth 2 % PADS 6 each, 6 each, Topical, Q0600, Nadara Mustard, MD, 6 each at 11/19/22 0509   clotrimazole (GYNE-LOTRIMIN 3) 2 % vaginal cream 1 Applicatorful, 1 Applicatorful, Vaginal, QHS, Nadara Mustard, MD, 1 Applicatorful at 11/16/22 2245   docusate sodium (ENEMEEZ) enema 283 mg, 1 enema, Rectal, PRN, Nadara Mustard, MD   feeding supplement (NEPRO CARB STEADY) liquid 237 mL, 237 mL, Oral, BID BM, Rodolph Bong, MD, 237 mL at 11/18/22 0815   folic acid (FOLVITE) tablet 1 mg, 1 mg, Oral, Daily, Nadara Mustard, MD, 1 mg at 11/17/22 0801   heparin injection 1,000 Units, 1,000 Units, Intracatheter, PRN, Ejigiri, Megan Mans, PA-C   heparin injection 1,900 Units, 20 Units/kg, Dialysis, PRN, Theodoro Clock, Ogechi Grace, PA-C   hydrALAZINE (APRESOLINE) injection 5 mg, 5 mg, Intravenous, Q4H PRN, Nadara Mustard, MD   HYDROmorphone (DILAUDID) injection 0.5-1 mg, 0.5-1 mg, Intravenous, Q4H PRN, Nadara Mustard, MD, 1 mg at 11/19/22 0636   insulin aspart (novoLOG) injection 0-5 Units, 0-5 Units, Subcutaneous, QHS, Nadara Mustard, MD, 2 Units at 11/18/22 2158   insulin aspart (novoLOG) injection 0-6 Units, 0-6 Units, Subcutaneous, TID WC, Nadara Mustard, MD, 2 Units at 11/18/22 1718   insulin glargine-yfgn (SEMGLEE) injection 20 Units, 20  Units, Subcutaneous, QHS, Rodolph Bong, MD, 20 Units at 11/18/22 2237   insulin glargine-yfgn (SEMGLEE) injection 40 Units, 40 Units, Subcutaneous, Daily, Rodolph Bong, MD, 40 Units at 11/18/22 0814   ipratropium-albuterol (DUONEB) 0.5-2.5 (3) MG/3ML nebulizer solution 3 mL, 3 mL, Nebulization, Q6H PRN, Rodolph Bong, MD   levothyroxine (SYNTHROID) tablet 75 mcg, 75 mcg, Oral, QAC breakfast, Nadara Mustard, MD, 75 mcg at 11/19/22 0513   lidocaine (PF) (XYLOCAINE) 1 % injection 5 mL, 5 mL, Intradermal, PRN, Ejigiri, Megan Mans, PA-C   lidocaine-prilocaine (EMLA) cream 1 Application, 1 Application, Topical, PRN, Ejigiri, Ogechi Grace, PA-C   LORazepam (ATIVAN) tablet 0.5 mg, 0.5 mg, Oral, QHS, Nadara Mustard, MD, 0.5 mg at 11/18/22 2138   magnesium citrate solution 1 Bottle, 1 Bottle, Oral, Once PRN, Nadara Mustard, MD   methocarbamol (ROBAXIN) tablet 500 mg, 500 mg, Oral, Q6H PRN, 500 mg at 11/17/22 2227 **OR** methocarbamol (ROBAXIN) 500 mg in dextrose 5 % 50 mL IVPB, 500 mg, Intravenous, Q6H PRN, Nadara Mustard, MD   metoCLOPramide (REGLAN) tablet 5-10 mg, 5-10 mg, Oral, Q8H PRN **OR** metoCLOPramide (REGLAN) injection 5-10 mg, 5-10 mg, Intravenous, Q8H PRN, Nadara Mustard, MD   midodrine (PROAMATINE) tablet 10 mg, 10 mg, Oral, Q T,Th,Sa-HD, Nadara Mustard, MD, 10 mg at 11/19/22 4034   multivitamin (RENA-VIT) tablet 1 tablet, 1 tablet, Oral, QHS, Rodolph Bong, MD, 1 tablet at 11/18/22 2138   oxyCODONE (Oxy IR/ROXICODONE) immediate release tablet 5 mg, 5 mg, Oral, Q4H PRN, Rodolph Bong, MD, 5 mg at 11/19/22 0513   pantoprazole (PROTONIX) EC tablet 40 mg, 40 mg, Oral, BID AC, Nadara Mustard, MD, 40 mg at 11/18/22 1707   pentafluoroprop-tetrafluoroeth (GEBAUERS) aerosol 1 Application, 1 Application, Topical, PRN, Ejigiri, Megan Mans, PA-C   polyethylene glycol (MIRALAX / GLYCOLAX) packet 17 g, 17 g, Oral, Daily PRN, Nadara Mustard, MD   polyethylene glycol (MIRALAX /  GLYCOLAX) packet 17 g, 17 g,  Oral, Daily, Rodolph Bong, MD, 17 g at 11/18/22 0815   pregabalin (LYRICA) capsule 150 mg, 150 mg, Oral, BID, Nadara Mustard, MD, 150 mg at 11/18/22 2138   ramelteon (ROZEREM) tablet 8 mg, 8 mg, Oral, QHS, Carrion-Carrero, Margely, MD, 8 mg at 11/18/22 2139   senna-docusate (Senokot-S) tablet 2 tablet, 2 tablet, Oral, BID, Nadara Mustard, MD, 2 tablet at 11/18/22 2138   sevelamer carbonate (RENVELA) tablet 1,600 mg, 1,600 mg, Oral, TID WC, Nadara Mustard, MD, 1,600 mg at 11/18/22 1707   sorbitol 70 % solution 30 mL, 30 mL, Oral, PRN, Nadara Mustard, MD   venlafaxine XR (EFFEXOR-XR) 24 hr capsule 75 mg, 75 mg, Oral, Q breakfast, Rodolph Bong, MD, 75 mg at 11/18/22 8119   zinc sulfate capsule 220 mg, 220 mg, Oral, Daily, Nadara Mustard, MD, 220 mg at 11/17/22 0801   zolpidem (AMBIEN) tablet 5 mg, 5 mg, Oral, QHS PRN, Nadara Mustard, MD  Patients Current Diet:  Diet Order             Diet regular Room service appropriate? Yes; Fluid consistency: Thin; Fluid restriction: 1500 mL Fluid  Diet effective now                  Precautions / Restrictions Precautions Precautions: Fall Precaution Comments: LLE wound Restrictions Weight Bearing Restrictions: Yes LLE Weight Bearing: Weight bearing as tolerated   Has the patient had 2 or more falls or a fall with injury in the past year? No  Prior Activity Level Limited Community (1-2x/wk): nonambulatory for 9 months since BKA. hoyer to wheelchair; sleeps in recliner  Prior Functional Level Self Care: Did the patient need help bathing, dressing, using the toilet or eating? Needed some help  Indoor Mobility: Did the patient need assistance with walking from room to room (with or without device)? Dependent  Stairs: Did the patient need assistance with internal or external stairs (with or without device)? Dependent  Functional Cognition: Did the patient need help planning regular tasks such as shopping  or remembering to take medications? Needed some help  Patient Information Are you of Hispanic, Latino/a,or Spanish origin?: A. No, not of Hispanic, Latino/a, or Spanish origin What is your race?: A. White Do you need or want an interpreter to communicate with a doctor or health care staff?: 0. No  Patient's Response To:  Health Literacy and Transportation Is the patient able to respond to health literacy and transportation needs?: Yes Health Literacy - How often do you need to have someone help you when you read instructions, pamphlets, or other written material from your doctor or pharmacy?: Never In the past 12 months, has lack of transportation kept you from medical appointments or from getting medications?: No In the past 12 months, has lack of transportation kept you from meetings, work, or from getting things needed for daily living?: No  Home Assistive Devices / Equipment Home Equipment: Grab bars - tub/shower, Agricultural consultant (2 wheels), Rollator (4 wheels), BSC/3in1, Cane - quad, Shower seat, Wheelchair - manual  Prior Device Use: Indicate devices/aids used by the patient prior to current illness, exacerbation or injury? Manual wheelchair and Mechanical lift  Current Functional Level Cognition  Overall Cognitive Status: Within Functional Limits for tasks assessed Orientation Level: Oriented X4    Extremity Assessment (includes Sensation/Coordination)  Upper Extremity Assessment: RUE deficits/detail, LUE deficits/detail RUE Deficits / Details: B hand neuropathy, B shoudler pain with AROM, functional ROM RUE: Shoulder pain with  ROM RUE Sensation: history of peripheral neuropathy RUE Coordination: decreased fine motor LUE Deficits / Details: B hand neuropathy, B shoudler pain with AROM, functional ROM LUE: Shoulder pain with ROM LUE Sensation: history of peripheral neuropathy LUE Coordination: decreased fine motor  Lower Extremity Assessment: RLE deficits/detail, LLE  deficits/detail RLE Deficits / Details: hx BKA LLE Deficits / Details: Bandaged, gross weakness present, able to lift LE agains gravity through partial range. LLE: Unable to fully assess due to pain    ADLs  Overall ADL's : Needs assistance/impaired Eating/Feeding: Set up, Bed level Eating/Feeding Details (indicate cue type and reason): difficulty opening packages, drinks, hand weakness/neuropathy Grooming: Set up, Bed level Upper Body Bathing: Moderate assistance Lower Body Bathing: Maximal assistance Upper Body Dressing : Minimal assistance Lower Body Dressing: Maximal assistance Toilet Transfer: Maximal assistance, +2 for physical assistance, +2 for safety/equipment Toilet Transfer Details (indicate cue type and reason): with Stedy Toileting- Clothing Manipulation and Hygiene: Total assistance Functional mobility during ADLs: Maximal assistance, +2 for physical assistance, +2 for safety/equipment General ADL Comments: Pt requires significant assistance for ADLs, able to perform some UB ADLs, hand neuropathy and weak grip limt participation    Mobility  Overal bed mobility: Needs Assistance Bed Mobility: Rolling Rolling: Supervision, Mod assist Supine to sit: HOB elevated, Min assist Sit to supine: Min guard General bed mobility comments: Performed rolling to L to reposition bed pad in the correct position.  Pt performed to the R with mod assistance.    Transfers  Overall transfer level:  (deferred as she felt dizzy after dialysis session.) Equipment used: Ambulation equipment used Transfers: Sit to/from Stand Sit to Stand: Max assist, +2 physical assistance, From elevated surface General transfer comment: Performed sit<>stand with Antony Salmon Max A +2 for the first 2 attempts with cues for technique and upright posture for full hip extenstion to neutral. 3rd attempt Mod assist +2, fear of fallnig forward. Cues for UE use to push upright. Tolerated each stand up to 10 seconds. Was not  utilizing knee pad on Lt first two attempts, third attempt tolerated better with this block to assist for leverage.    Ambulation / Gait / Stairs / Wheelchair Mobility  Ambulation/Gait General Gait Details: non ambulatory for quite some time.    Posture / Balance Dynamic Sitting Balance Sitting balance - Comments: able to balance sitting EOB Balance Overall balance assessment: Needs assistance Sitting-balance support: No upper extremity supported, Feet supported Sitting balance-Leahy Scale: Fair Sitting balance - Comments: able to balance sitting EOB Standing balance support: Bilateral upper extremity supported, Reliant on assistive device for balance Standing balance-Leahy Scale: Zero Standing balance comment: Held rail on stedy, Mod A +2 to stabilize.    Special needs/care consideration Date of Service: 11/10/2022  7:21 PM    Show:Clear all [x] Written[x] Templated[] Copied  Added by: [x] McNichol, Bonney Aid, RN  [] Hover for details WOC Nurse Consult Note: Reason for Consult:LLE with acute infestation of larvae. Dr. Lajoyce Corners is consulting and plans for surgical procedure on Wednesday or Friday of this week.Photodocumentation of wound is provided to the EMR by Provider and is appreciated. Also noted is a partial thickness Stage 2 pressure injury to the sacrum, 2cm x 3cm and superficial, depth is 0.1cm Wound type:Infectious Pressure Injury POA: N/A Measurement:Last measurement by Dr, Audrie Lia staff is 5cm x 3cm with depth obscured by the presence of nonviable tissue. Wound WVP:[id number] tissue. Drainage (amount, consistency, odor) small serosanguinous Periwound:erythematous, edematous Dressing procedure/placement/frequency:I have provided Nursing with guidance in the care of this wound using  sodium hypochlorite (Dakin's) solution to both cleanse and dress the wound. One-quarter strength sodium hypochlorite solution will be used twice daily to moisten gauze and be placed as a wound contact  layer. This is to be covered with dry gauze and secured with Kerlix roll gauze/paper tape. Stage 2 PI to the sacrum will be treated with a daily cleanse followed by covering of the lesion with a xeroform gauze. This will be topped with a dry gauze and secured with a silicone foam. The xeroform is to be changed daily, The silicone foam may be reused for up to 3 days and changed PRN soiling.   WOC nursing team will not follow, but will remain available to this patient, the nursing and medical teams.  Please re-consult if needed.   Thank you for inviting [name] to participate in this patient's Plan of Care.   Ladona Mow, MSN, RN, CNS, GNP, CWOCN, CWON-AP, WOCNF, FAAN  Mobile phone:  [phone]       Wound VAC to LLE after surgical debridement Psychiatry consultation for med management [facility] notified to have prosthetic leg brought for fitting and use during CIR admit on 6/26 ( Bobby) Spouse pays privately for wheelchair transport to OP dialysis    Previous Home Environment  Living Arrangements: Spouse/significant other  Lives With: Spouse Available Help at Discharge: Family, Available 24 hours/day (spouse is on FMLA, may retire early when [employer] over; can provide 24/7) Type of Home: House Home Layout: One level Home Access: Ramped entrance Bathroom Shower/Tub: Tub/shower unit, Door [employer] Toilet: Handicapped height Bathroom Accessibility: Yes How Accessible: Accessible via walker Additional Comments: Husband is out on FMLA until Sept. She was scheduled to get a prosthesis for RLE prior to admission  Discharge Living Setting Plans for Discharge Living Setting: Patient's home, Lives with (comment) (spouse) Type of Home at Discharge: House Discharge Home Layout: One level Discharge Home Access: Ramped entrance Discharge Bathroom Shower/Tub: Tub/shower unit Discharge Bathroom Toilet: Handicapped height Discharge Bathroom Accessibility: Yes How Accessible: Accessible via  walker Does the patient have any problems obtaining your medications?: No  Social/Family/Support Systems Patient Roles: Spouse Contact Information: spouse. "Joe" Ramon Dredge Anticipated Caregiver: spouse Anticipated Industrial/product designer Information: see contacts Ability/Limitations of Caregiver: mod assist hoyer level he can provide Caregiver Availability: 24/7 Discharge Plan Discussed with Primary Caregiver: Yes Is Caregiver In Agreement with Plan?: Yes Does Caregiver/Family have Issues with Lodging/Transportation while Pt is in Rehab?: No  Goals Patient/Family Goal for Rehab: min to mod assist with PT and OT at wheelchair level Expected length of stay: ELOS 2 weeks Additional Information: Spouse pays privately for whellchair transport to outpatient hemodialysis Pt/Family Agrees to Admission and willing to participate: Yes Program Orientation Provided & Reviewed with Pt/Caregiver Including Roles  & Responsibilities: Yes Additional Information Needs: [facility] has prosthetic leg for fitting. Needs to be utilized during her rehab stay/ AUth was given to incorporate use of prosthetic leg training  Barriers to Discharge: Insurance for SNF coverage (she has already used all 100 days of SNF coverage. D/c'd home 09/23/22)  Decrease burden of Care through IP rehab admission: Decrease number of caregivers  Possible need for SNF placement upon discharge: no, has used all 100 days of SNF benefits. D/c'd form [facility] 09/23/22. Hoyer lift usage at home.   Patient Condition: I have reviewed medical records from [facility], spoken with  patient and spouse. I met with patient at the bedside for inpatient rehabilitation assessment.  Patient will benefit from ongoing PT and  OT, can actively participate in 3 hours of therapy a day 5 days of the week, and can make measurable gains during the admission.  Patient will also benefit from the coordinated team approach during an Inpatient Acute Rehabilitation  admission.  The patient will receive intensive therapy as well as Rehabilitation physician, nursing, social worker, and care management interventions.  Due to bladder management, bowel management, safety, skin/wound care, disease management, medication administration, pain management, and patient education the patient requires 24 hour a day rehabilitation nursing.  The patient is currently mod assist overall with mobility and basic ADLs.  Discharge setting and therapy post discharge at home with home health is anticipated.  Patient has agreed to participate in the Acute Inpatient Rehabilitation Program and will admit today.  Preadmission Screen Completed By:  Worthy Keeler MSN 11/19/2022 8:32 AM ______________________________________________________________________   Discussed status with Dr. Natale Lay on 11/19/22 at 1038 and received approval for admission today.  Admission Coordinator:  Clois Dupes, RN, MSN time 1038 Date 11/19/22   Assessment/Plan: Diagnosis: Debility due to L lower extremity wound infection/necrotic wound with maggots Does the need for close, 24 hr/day Medical supervision in concert with the patient's rehab needs make it unreasonable for this patient to be served in a less intensive setting? Yes Co-Morbidities requiring supervision/potential complications: CAD, CHF diastolic, ESRD TTS HD, PVD, S/P R BKA, hypothyroidism, obesity, afib, DM2, afib, PVD, morbid obesity, MDD/GAD, dizziness, sacral pressure injury Due to bladder management, bowel management, safety, skin/wound care, disease management, medication administration, pain management, and patient education, does the patient require 24 hr/day rehab nursing? Yes Does the patient require coordinated care of a physician, rehab nurse, PT, OT, and SLP to address physical and functional deficits in the context of the above medical diagnosis(es)? Yes Addressing deficits in the following areas: balance,  endurance, locomotion, strength, transferring, bowel/bladder control, bathing, dressing, feeding, grooming, toileting, and psychosocial support Can the patient actively participate in an intensive therapy program of at least 3 hrs of therapy 5 days a week? Yes The potential for patient to make measurable gains while on inpatient rehab is excellent Anticipated functional outcomes upon discharge from inpatient rehab: min assist and mod assist PT, min assist and mod assist OT, n/a SLP Estimated rehab length of stay to reach the above functional goals is: 14 days Anticipated discharge destination: Home 10. Overall Rehab/Functional Prognosis: good   MD Signature: Fanny Dance

## 2022-11-19 NOTE — Discharge Summary (Signed)
Discharge Diagnoses:  Principal Problem:   Complicated wound infection Active Problems:   Hyperlipemia   Hypotension   Impaired ambulation   Hypothyroidism   Prolonged QT interval   Type 2 diabetes mellitus with diabetic polyneuropathy, with long-term current use of insulin (HCC)   Obesity, Class III, BMI 40-49.9 (morbid obesity) (HCC)   Paroxysmal atrial fibrillation (HCC)   S/P BKA (below knee amputation) unilateral, right (HCC)   ESRD on dialysis (HCC)   Maggot infestation   Cellulitis of left lower extremity   Surgeries: Procedure(s): IRRIGATION AND DEBRIDEMENT OF LOWER EXTREMITY WOUND APPLICATION OF WOUND VAC on 11/14/2022    Consultants: Treatment Team:  Bufford Buttner, MD Nadara Mustard, MD Mariel Craft, MD Delano Metz, MD  Discharged Condition: Improved  Hospital Course: Susan [name] is an 72 y.o. female who was admitted 11/10/2022 with a chief complaint of ulcer left leg, with a final diagnosis of cellulitis of left lower extremity.  Patient was brought to the operating room on 11/14/2022 and underwent Procedure(s): IRRIGATION AND DEBRIDEMENT OF LOWER EXTREMITY WOUND APPLICATION OF WOUND VAC.    Patient was given perioperative antibiotics:  Anti-infectives (From admission, onward)    Start     Dose/Rate Route Frequency Ordered Stop   11/18/22 1800  ceFAZolin (ANCEF) IVPB 2g/100 mL premix        2 g 200 mL/hr over 30 Minutes Intravenous Every T-Th-Sa (1800) 11/17/22 1139     11/18/22 0000  ceFAZolin (ANCEF) IVPB        2 g Intravenous Every T-Th-Sa (Hemodialysis) 11/17/22 1150 11/28/22 2359   11/17/22 1230  ceFAZolin (ANCEF) IVPB 1 g/50 mL premix        1 g 100 mL/hr over 30 Minutes Intravenous  Once 11/17/22 1139 11/17/22 1245   11/15/22 1800  ceFEPIme (MAXIPIME) 2 g in sodium chloride 0.9 % 100 mL IVPB  Status:  Discontinued        2 g 200 mL/hr over 30 Minutes Intravenous Every T-Th-Sa (1800) 11/14/22 1233 11/17/22 1139   11/14/22 2300   vancomycin (VANCOCIN) IVPB 1000 mg/200 mL premix        1,000 mg 200 mL/hr over 60 Minutes Intravenous  Once 11/14/22 2207 11/15/22 0021   11/14/22 1500  vancomycin (VANCOCIN) IVPB 1000 mg/200 mL premix  Status:  Discontinued        1,000 mg 200 mL/hr over 60 Minutes Intravenous  Once 11/14/22 1433 11/14/22 2207   11/14/22 1400  metroNIDAZOLE (FLAGYL) IVPB 500 mg  Status:  Discontinued        500 mg 100 mL/hr over 60 Minutes Intravenous Every 12 hours 11/14/22 1221 11/17/22 1252   11/14/22 1400  ceFEPIme (MAXIPIME) 2 g in sodium chloride 0.9 % 100 mL IVPB        2 g 200 mL/hr over 30 Minutes Intravenous  Once 11/14/22 1233 11/15/22 1857   11/14/22 0923  vancomycin (VANCOCIN) powder  Status:  Discontinued          As needed 11/14/22 0924 11/14/22 0930   11/14/22 0745  vancomycin (VANCOCIN) IVPB 1000 mg/200 mL premix  Status:  Discontinued        1,000 mg 200 mL/hr over 60 Minutes Intravenous On call to O.R. 11/14/22 0631 11/14/22 1032   11/14/22 0730  levofloxacin (LEVAQUIN) IVPB 500 mg  Status:  Discontinued        500 mg 100 mL/hr over 60 Minutes Intravenous On call to O.R. 11/14/22 0631 11/14/22 1032   11/13/22 1730  fluconazole (DIFLUCAN) tablet 150 mg        150 mg Oral  Once 11/13/22 1630 11/13/22 1707   11/12/22 1145  vancomycin (VANCOCIN) IVPB 1000 mg/200 mL premix  Status:  Discontinued        1,000 mg 200 mL/hr over 60 Minutes Intravenous On call to O.R. 11/12/22 1052 11/12/22 1055   11/12/22 1145  levofloxacin (LEVAQUIN) IVPB 500 mg  Status:  Discontinued        500 mg 100 mL/hr over 60 Minutes Intravenous On call to O.R. 11/12/22 1052 11/12/22 1055   11/12/22 1054  vancomycin (VANCOCIN) 1-5 GM/200ML-% IVPB  Status:  Discontinued       Note to Pharmacy: [facility], [place]: cabinet override      11/12/22 1054 11/12/22 1114   11/12/22 0815  vancomycin (VANCOCIN) IVPB 1000 mg/200 mL premix        1,000 mg 200 mL/hr over 60 Minutes Intravenous  Once 11/12/22 0718 11/12/22 2040    11/11/22 1354  vancomycin (VANCOCIN) IVPB 1000 mg/200 mL premix  Status:  Discontinued        1,000 mg 200 mL/hr over 60 Minutes Intravenous Every T-Th-Sa (Hemodialysis) 11/11/22 1354 11/16/22 1645   11/10/22 1700  piperacillin-tazobactam (ZOSYN) IVPB 2.25 g  Status:  Discontinued        2.25 g 100 mL/hr over 30 Minutes Intravenous Every 8 hours 11/10/22 1645 11/14/22 1221   11/10/22 1645  vancomycin (VANCOREADY) IVPB 2000 mg/400 mL        2,000 mg 200 mL/hr over 120 Minutes Intravenous  Once 11/10/22 1632 11/11/22 1930     .  Patient was given sequential compression devices, early ambulation, and aspirin for DVT prophylaxis.  Recent vital signs: Patient Vitals for the past 24 hrs:  BP Temp Temp src Pulse Resp SpO2 Weight  11/19/22 0930 (!) 131/56 -- -- 74 13 100 % --  11/19/22 0900 (!) 129/50 -- -- 68 19 100 % --  11/19/22 0830 (!) 120/48 -- -- 76 14 93 % --  11/19/22 0800 (!) 116/53 -- -- 72 15 94 % --  11/19/22 0746 (!) 110/51 -- -- 69 13 100 % --  11/19/22 0737 (!) 116/53 98.1 F (36.7 C) Oral 71 13 100 % 100.5 kg  11/19/22 0636 110/61 98.3 F (36.8 C) Oral 83 20 100 % --  11/19/22 0453 (!) 113/44 98.2 F (36.8 C) Oral 71 18 97 % --  11/18/22 2002 (!) 106/39 97.8 F (36.6 C) Oral 63 18 100 % --  11/18/22 1716 97/83 -- -- 66 16 100 % --  11/18/22 1445 (!) 93/34 98.2 F (36.8 C) Oral 67 18 100 % --  11/18/22 1340 96/77 97.8 F (36.6 C) Oral 97 17 100 % --  11/18/22 1333 96/77 97.8 F (36.6 C) Oral 97 17 100 % --  11/18/22 1253 106/65 (!) 97.4 F (36.3 C) -- 69 16 99 % 97.2 kg  11/18/22 1250 (!) 110/38 -- -- 68 18 96 % --  11/18/22 1130 (!) 119/59 -- -- 63 12 100 % --  11/18/22 1100 (!) 101/53 -- -- (!) 58 10 100 % --  11/18/22 1030 (!) 108/44 -- -- (!) 58 (!) 8 100 % --  .  Recent laboratory studies: DG CHEST PORT 1 VIEW  Result Date: 11/17/2022 CLINICAL DATA:  Shortness of breath EXAM: PORTABLE CHEST 1 VIEW COMPARISON:  Radiograph 09/01/2022 FINDINGS: Right IJ CVC  tip in the low SVC. Stable cardiomegaly. Coronary stenting. Aortic atherosclerotic calcification.  No focal consolidation, pleural effusion, or pneumothorax. No displaced rib fractures. IMPRESSION: No active disease.  Cardiomegaly. Electronically Signed   By: Minerva Fester M.D.   On: 11/17/2022 20:38    Discharge Medications:   Allergies as of 11/19/2022       Reactions   Cephalexin Diarrhea, Other (See Comments)   Codeine Nausea And Vomiting, Other (See Comments)        Medication List     STOP taking these medications    oxyCODONE-acetaminophen 5-325 MG tablet Commonly known as: PERCOCET/ROXICET       TAKE these medications    albuterol 108 (90 Base) MCG/ACT inhaler Commonly known as: VENTOLIN HFA Inhale 1-2 puffs into the lungs every 6 (six) hours as needed for wheezing or shortness of breath.   amiodarone 200 MG tablet Commonly known as: PACERONE Take 200 mg by mouth daily.   Anusol-HC 2.5 % rectal cream Generic drug: hydrocortisone Place 1 Application rectally daily as needed for hemorrhoids. (1000 & 2100)   apixaban 5 MG Tabs tablet Commonly known as: ELIQUIS Take 1 tablet (5 mg total) by mouth 2 (two) times daily.   atorvastatin 80 MG tablet Commonly known as: LIPITOR Take 1 tablet (80 mg total) by mouth daily.   ceFAZolin  IVPB Commonly known as: ANCEF Inject 2 g into the vein Every Tuesday,Thursday,and Saturday with dialysis for 10 days. Indication:  Wound infection Last Day of Therapy:  11/28/22 Method of administration: Per HD protocol Method of administration may be changed at the discretion of home infusion pharmacist based upon assessment of the patient and/or caregiver's ability to self-administer the medication ordered.   diphenoxylate-atropine 2.5-0.025 MG tablet Commonly known as: Lomotil Take 1 tablet by mouth 4 (four) times daily as needed for diarrhea or loose stools.   feeding supplement (NEPRO CARB STEADY) Liqd Take 237 mLs by mouth daily.  (0900)   ferrous sulfate 325 (65 FE) MG tablet Take 325 mg by mouth 2 (two) times daily with a meal. (1000 & 2100)   FreeStyle Libre 2 Sensor Misc   Lantus SoloStar 100 UNIT/ML Solostar Pen Generic drug: insulin glargine Inject 16 Units into the skin 2 (two) times daily. What changed:  how much to take when to take this   levothyroxine 75 MCG tablet Commonly known as: SYNTHROID Take 75 mcg by mouth daily before breakfast.   linagliptin 5 MG Tabs tablet Commonly known as: TRADJENTA Take 1 tablet (5 mg total) by mouth daily.   LORazepam 0.5 MG tablet Commonly known as: ATIVAN Take 1 tablet (0.5 mg total) by mouth at bedtime.   metoCLOPramide 10 MG tablet Commonly known as: REGLAN Take 1 tablet (10 mg total) by mouth every 6 (six) hours as needed for nausea or vomiting.   midodrine 10 MG tablet Commonly known as: PROAMATINE Take 10 mg by mouth daily.   NovoLOG FlexPen 100 UNIT/ML FlexPen Generic drug: insulin aspart Inject 0-15 Units into the skin See admin instructions. Inject 12 units subcutaneously 3 times daily with meals (0900, 1300 & 1700) & as needed via sliding scale insulin CBG <70=Notify NP/MD 71-149=0 units,150-199=4 units, 200-249=6 units,250- 299=10 units, 300-349=12 units, 350-399=15 units, > 400 call MD ( prime pen with 2 units prior  to set dose) What changed: how much to take   nystatin 100000 UNIT/ML suspension Commonly known as: MYCOSTATIN Use as directed 5 mLs (500,000 Units total) in the mouth or throat 4 (four) times daily.   ondansetron 4 MG tablet Commonly known as: ZOFRAN  Take 4 mg by mouth every 8 (eight) hours as needed for nausea.   OneTouch Delica Plus Lancet30G Misc Use as directed to check blood sugars as need up to 4 times daily   OneTouch Verio Flex System w/Device Kit Use up to four times daily as directed.   OXYGEN Inhale 2.5 L/min into the lungs at bedtime.   pantoprazole 40 MG tablet Commonly known as: Protonix Take 1 tablet  (40 mg total) by mouth 2 (two) times daily before a meal.   Pentips 32G X 4 MM Misc Generic drug: Insulin Pen Needle Use as directed   polyethylene glycol 17 g packet Commonly known as: MIRALAX / GLYCOLAX Take 17 g by mouth 2 (two) times daily. What changed:  when to take this reasons to take this   pregabalin 150 MG capsule Commonly known as: LYRICA Take 150 mg by mouth 2 (two) times daily.   pregabalin 75 MG capsule Commonly known as: LYRICA Take 1 capsule (75 mg total) by mouth daily.   senna-docusate 8.6-50 MG tablet Commonly known as: Senokot-S Take 2 tablets by mouth 2 (two) times daily.   sevelamer carbonate 800 MG tablet Commonly known as: RENVELA Take 1,600 mg by mouth 3 (three) times daily with meals. (0800, 1200 & 1700)   vortioxetine HBr 20 MG Tabs tablet Commonly known as: Trintellix Take 1 tablet (20 mg total) by mouth in the morning. (1000)               Home Infusion Instuctions  (From admission, onward)           Start     Ordered   11/17/22 0000  Home infusion instructions       Question:  Instructions  Answer:  Flushing of vascular access device: 0.9% NaCl pre/post medication administration and prn patency; Heparin 100 u/ml, 5ml for implanted ports and Heparin 10u/ml, 5ml for all other central venous catheters.   11/17/22 1150            Diagnostic Studies: DG CHEST PORT 1 VIEW  Result Date: 11/17/2022 CLINICAL DATA:  Shortness of breath EXAM: PORTABLE CHEST 1 VIEW COMPARISON:  Radiograph 09/01/2022 FINDINGS: Right IJ CVC tip in the low SVC. Stable cardiomegaly. Coronary stenting. Aortic atherosclerotic calcification. No focal consolidation, pleural effusion, or pneumothorax. No displaced rib fractures. IMPRESSION: No active disease.  Cardiomegaly. Electronically Signed   By: Minerva Fester M.D.   On: 11/17/2022 20:38   PERIPHERAL VASCULAR CATHETERIZATION  Result Date: 11/07/2022 DATE OF SERVICE: 11/07/2022  PATIENT:  Susan [name]   72 y.o. female  PRE-OPERATIVE DIAGNOSIS:  ESRD, malfunctioning fistula  POST-OPERATIVE DIAGNOSIS:  ESRD; thrombosed fistula  PROCEDURE:  1) left arm fistulagram 2) left arm angiogram  SURGEON:  Surgeon(s) and Role:    * Leonie Douglas, MD - Primary  ASSISTANT: none  ANESTHESIA:   local  EBL: minimal  BLOOD ADMINISTERED:none  DRAINS: none  LOCAL MEDICATIONS USED:  LIDOCAINE  SPECIMEN:  none  COUNTS: confirmed correct.  TOURNIQUET:  none  PATIENT DISPOSITION:  PACU - hemodynamically stable.  Delay start of Pharmacological VTE agent (>24hrs) due to surgical blood loss or risk of bleeding: no  INDICATION FOR PROCEDURE: Susan [name] is a 72 y.o. female with ESRD; non-functional left arm fistula. After careful discussion of risks, benefits, and alternatives the patient was offered fistulagram. The patient understood and wished to proceed.  OPERATIVE FINDINGS: Thrombosed fistula. Not salvageable. Left upper extremity angiogram normal.  DESCRIPTION OF PROCEDURE: After identification  of the patient in the pre-operative holding area, the patient was transferred to the operating room. The patient was positioned supine on the operating room table. Anesthesia was induced. The left arm was prepped and draped in standard fashion. A surgical pause was performed confirming correct patient, procedure, and operative location.  The open portion of the fistula was cannulated with micropuncture technique.  Fistulogram was performed.  This showed a completely thrombosed fistula and a normal arterial tree.  No options for salvage of the fistula were available.  All access was removed and manual pressure held.  A bandage was applied.  Upon completion of the case instrument and sharps counts were confirmed correct. The patient was transferred to the PACU in good condition. I was present for all portions of the procedure.  FOLLOW UP PLAN: Patient will need to follow-up with one of the previous surgeons in the next 4 weeks with vein  mapping to discuss during dialysis access.  Rande Brunt. Lenell Antu, MD [facility] Phone Number: [phone] 11/07/2022 11:33 AM    Patient benefited maximally from their hospital stay and there were no complications.     Disposition: Discharge disposition: 02-Transferred to [facility]      Discharge Instructions     Call MD / Call 911   Complete by: As directed    If you experience chest pain or shortness of breath, CALL 911 and be transported to the hospital emergency room.  If you develope a fever above 101 F, pus (white drainage) or increased drainage or redness at the wound, or calf pain, call your surgeon's office.   Constipation Prevention   Complete by: As directed    Drink plenty of fluids.  Prune juice may be helpful.  You may use a stool softener, such as Colace (over the counter) 100 mg twice a day.  Use MiraLax (over the counter) for constipation as needed.   Diet - low sodium heart healthy   Complete by: As directed    Home infusion instructions   Complete by: As directed    Instructions: Flushing of vascular access device: 0.9% NaCl pre/post medication administration and prn patency; Heparin 100 u/ml, 5ml for implanted ports and Heparin 10u/ml, 5ml for all other central venous catheters.   Increase activity slowly as tolerated   Complete by: As directed    Negative Pressure Wound Therapy - Incisional   Complete by: As directed    Attached the wound VAC dressing to a Praveena plus portable wound VAC pump for discharge.   Post-operative opioid taper instructions:   Complete by: As directed    POST-OPERATIVE OPIOID TAPER INSTRUCTIONS: It is important to wean off of your opioid medication as soon as possible. If you do not need pain medication after your surgery it is ok to stop day one. Opioids include: Codeine, Hydrocodone(Norco, Vicodin), Oxycodone(Percocet, oxycontin) and hydromorphone amongst others.  Long term and  even short term use of opiods can cause: Increased pain response Dependence Constipation Depression Respiratory depression And more.  Withdrawal symptoms can include Flu like symptoms Nausea, vomiting And more Techniques to manage these symptoms Hydrate well Eat regular healthy meals Stay active Use relaxation techniques(deep breathing, meditating, yoga) Do Not substitute Alcohol to help with tapering If you have been on opioids for less than two weeks and do not have pain than it is ok to stop all together.  Plan to wean off of opioids This plan should start within one week  post op of your joint replacement. Maintain the same interval or time between taking each dose and first decrease the dose.  Cut the total daily intake of opioids by one tablet each day Next start to increase the time between doses. The last dose that should be eliminated is the evening dose.          Follow-up Information     [facility] Follow up.   Specialty: Home Health Services Contact information: [address] [place] [address] [phone]         Nadara Mustard, MD Follow up in 1 week(s).   Specialty: Orthopedic Surgery Contact information: [address] [place] [address] [phone]                  Signed: Nadara Mustard 11/19/2022, 9:58 AM

## 2022-11-19 NOTE — Progress Notes (Addendum)
Inpatient Rehabilitation Admissions Coordinator  Noted BP issues post HD. We await resolution of hypotension before proceeding with CIR admit today.  Ottie Glazier, RN, MSN Rehab Admissions Coordinator [phone] 11/19/2022 1:38 PM  Will hold admit to CIR today. I will follow up tomorrow.  Ottie Glazier, RN, MSN Rehab Admissions Coordinator [phone] 11/19/2022 2:46 PM

## 2022-11-19 NOTE — Significant Event (Signed)
Rapid Response Event Note   Reason for Call :  Hypotension and altered mental status  Initial Focused Assessment:  She is sitting upright in bed she has myoclonus twitches and very weak.  She is tearful.   Lung sounds decreased bases, no respiratory distress. Heart tones regular. She is pale but warm and dry. She is able to answer questions and follow commands but she is weak   BP 89/53  HR 71  RR 16  O2 sat 100% on RA   Interventions:  500 cc NS bolus Unable to draw ABG Venous blood gas sent  Plan of Care:  Monitor for increased somnolence    Event Summary:   MD Notified:  Pokhrel Call Time: 1352 Arrival Time: 1355 End Time: 1425  Marcellina Millin, RN

## 2022-11-19 NOTE — Progress Notes (Signed)
RT attempted ABG X2 with no success. MD notified, vitals stable, SpO2 99% on 2L, VBG ordered, RT will monitor as needed.

## 2022-11-19 NOTE — PMR Pre-admission (Signed)
Fanny Dance, MD  Physician Physical Medicine and Rehabilitation   PMR Pre-admission     Signed   Date of Service: 11/19/2022  8:32 AM   Signed      Show:Clear all [x] Written[x] Templated[x] Copied  Added by: [x] Standley Brooking, RN[x] Fanny Dance, MD  [] Hover for details PMR Admission Coordinator Pre-Admission Assessment   Patient: Susan [name] is an 72 y.o., female MRN: [id number] DOB: 11-May-1951 Height: 5\' 5"  (165.1 cm) Weight: 100.5 kg (bed)   Insurance Information HMO: yes    PPO:      PCP:      IPA:      80/20:      OTHER:  PRIMARY: [employer]      Policy#: [id number]      Subscriber: pt CM Name: Junious Dresser      Phone#: [phone]     Fax#: [phone] [facility] access Pre-Cert#: [id number] approved for 7 days      Employer:  Benefits:  Phone #: [phone]     Name: 6/25 Eff. Date: 05/26/22     Deduct: none      Out of Pocket Max: $3500      Life Max: none CIR: $225 co pay per day days 1 until 6      SNF: no co pay per day days 1 until 20; $203 co pay per day days 21 until 100 Outpatient: $15 per visit     Co-Pay: visits per medical neccesity Home Health: 80%      Co-Pay: 20% DME: 80%     Co-Pay: 20% Providers: in network   Dr Logan Bores approved CIR admit to begin use of prosthetic leg, decrease burden from hoyer to transfers for outpatient hemodialysis, etc.  SECONDARY: none   Financial Counselor:       Phone#:    The Engineer, materials Information Summary" for patients in Inpatient Rehabilitation Facilities with attached "Privacy Act Statement-Health Care Records" was provided and verbally reviewed with: Patient and Family   Emergency Contact Information Contact Information       Name Relation Home Work Mobile    [place] Spouse [phone]   [phone]    Leanna Battles Daughter [phone]   [phone]         Current Medical History  Patient Admitting Diagnosis: LLE complicated wound, Right BKA   History of Present Illness:  72 year old female with history of CAD, chronic diastolic CHF, HLD, ESRD on hemodialysis, PVD s/p Right BKA, hypothyroidism, afib and DM. Presented on 11/10/22  with maggot infestation of her LLE. 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 She went to SNF but has been unable to bear weight on her left leg. Discharged home 4/24. Also with a sacral ulcer. Has seen Dr Lajoyce Corners as an outpatient with compression wrapped placed to LLE. Over the weekend of 6/15, she and spouse noticed maggot infestation of the wound and notified clinic. She was admitted 6/17 for surgical debridement of wound.   She was started on Vanc/Zosyn and Flagyl. Wound debridement by Dr Lajoyce Corners on 6/21 with Wound VAC applied. ESRD on hemodialysis managed by nephrology. On Semglee and SSI for DM management. Continue amiodarone for rate control. Eliquis resumed after debridement.Synthroid resumed as well as Midodrine. [facility] to be notified of admission for prosthetic fitting during CIR stay. Psychiatry consulted due to MDD/GAD with panic. On vortioxetine and to be transitioned to venlafaxine. WOC for sacral wound.    Admissions held on 6/26 due to soft  BP and twitching to upper extremities. Lyrica discontinued and given IV bolus.Received extra hemodialysis today off schedule for volume removal. Hemodialysis regular schedule 6/27 and BP maintained without difficulty. Tremors has lessened today.  Patient's medical record from [facility] has been reviewed by the rehabilitation admission coordinator and physician.   Past Medical History      Past Medical History:  Diagnosis Date   Anemia      hx   Anxiety     Arthritis      "generalized" (03/15/2014)   CAD (coronary artery disease)      MI in 2000 - MI  2007 - treated bare metal stent (no nuclear since then as 9/11)   Carotid artery disease (HCC)     Chronic diastolic heart failure (HCC)      a) ECHO (08/2013) EF 55-60% and RV function nl b) RHC  (08/2013) RA 4, RV 30/5/7, PA 25/10 (16), PCWP 7, Fick CO/CI 6.3/2.7, PVR 1.5 WU, PA 61 and 66%   Daily headache      "~ every other day; since I fell in June" (03/15/2014)   Depression     Dyslipidemia     ESRD (end stage renal disease) (HCC)      Dialysis on Tues Thurs Sat   Exertional shortness of breath     History of kidney stones     HTN (hypertension)     Hypothyroidism     Obesity     Osteoarthritis     PAF (paroxysmal atrial fibrillation) (HCC)     Peripheral neuropathy      bilateral feet/hands   PONV (postoperative nausea and vomiting)     RBBB (right bundle branch block)      Old   Stroke (HCC)      mini strokes   Type II diabetes mellitus (HCC)      Type II, [name]. patient has omnipod insulin pump with Novolin R Insulin    Has the patient had major surgery during 100 days prior to admission? Yes   Family History   family history includes Heart attack (age of onset: 51) in her mother.   Current Medications   Current Facility-Administered Medications:    (feeding supplement) PROSource Plus liquid 30 mL, 30 mL, Oral, BID BM, Nadara Mustard, MD, 30 mL at 11/18/22 1707   0.9 %  sodium chloride infusion, , Intravenous, Continuous, Nadara Mustard, MD   acetaminophen (TYLENOL) tablet 650 mg, 650 mg, Oral, Q6H PRN, 650 mg at 11/15/22 0319 **OR** acetaminophen (TYLENOL) suppository 650 mg, 650 mg, Rectal, Q6H PRN, Nadara Mustard, MD   acetaminophen (TYLENOL) tablet 1,000 mg, 1,000 mg, Oral, TID, Nadara Mustard, MD, 1,000 mg at 11/18/22 2138   acetaminophen (TYLENOL) tablet 325-650 mg, 325-650 mg, Oral, Q6H PRN, Nadara Mustard, MD   albumin human 25 % solution 25 g, 25 g, Intravenous, PRN, Penninger, Lindsay, PA, Last Rate: 60 mL/hr at 11/15/22 0940, 25 g at 11/15/22 0940   alteplase (CATHFLO ACTIVASE) injection 2 mg, 2 mg, Intra catheter, Once PRN, Ejigiri, Megan Mans, PA-C   alum & mag hydroxide-simeth (MAALOX/MYLANTA) 200-200-20 MG/5ML suspension  30 mL, 30 mL, Oral, Q6H PRN, Rodolph Bong, MD, 30 mL at 11/19/22 0514   amiodarone (PACERONE) tablet 200 mg, 200 mg, Oral, Daily, Nadara Mustard, MD, 200 mg at 11/17/22 2956   anticoagulant sodium citrate solution 5 mL, 5 mL, Intracatheter, PRN, Ejigiri, Megan Mans, PA-C   apixaban Everlene Balls)  tablet 5 mg, 5 mg, Oral, BID, Rodolph Bong, MD, 5 mg at 11/18/22 2138   ascorbic acid (VITAMIN C) tablet 500 mg, 500 mg, Oral, Daily, Nadara Mustard, MD, 500 mg at 11/17/22 0801   atorvastatin (LIPITOR) tablet 80 mg, 80 mg, Oral, Daily, Nadara Mustard, MD, 80 mg at 11/17/22 0801   bisacodyl (DULCOLAX) EC tablet 5 mg, 5 mg, Oral, Daily PRN, Nadara Mustard, MD   bisacodyl (DULCOLAX) suppository 10 mg, 10 mg, Rectal, Daily PRN, Nadara Mustard, MD   calcium carbonate (dosed in mg elemental calcium) suspension 500 mg of elemental calcium, 500 mg of elemental calcium, Oral, Q6H PRN, Nadara Mustard, MD, 500 mg of elemental calcium at 11/12/22 1929   camphor-menthol (SARNA) lotion 1 Application, 1 Application, Topical, Q8H PRN **AND** hydrOXYzine (ATARAX) tablet 25 mg, 25 mg, Oral, Q8H PRN, Nadara Mustard, MD, 25 mg at 11/19/22 1610   ceFAZolin (ANCEF) IVPB 2g/100 mL premix, 2 g, Intravenous, Q T,Th,Sat-1800, Singh, Mayanka, MD, Last Rate: 200 mL/hr at 11/18/22 1715, 2 g at 11/18/22 1715   Chlorhexidine Gluconate Cloth 2 % PADS 6 each, 6 each, Topical, Q0600, Nadara Mustard, MD, 6 each at 11/19/22 9604   clotrimazole (GYNE-LOTRIMIN 3) 2 % vaginal cream 1 Applicatorful, 1 Applicatorful, Vaginal, QHS, Nadara Mustard, MD, 1 Applicatorful at 11/16/22 2245   docusate sodium (ENEMEEZ) enema 283 mg, 1 enema, Rectal, PRN, Nadara Mustard, MD   feeding supplement (NEPRO CARB STEADY) liquid 237 mL, 237 mL, Oral, BID BM, Rodolph Bong, MD, 237 mL at 11/18/22 0815   folic acid (FOLVITE) tablet 1 mg, 1 mg, Oral, Daily, Nadara Mustard, MD, 1 mg at 11/17/22 0801   heparin injection 1,000 Units, 1,000 Units,  Intracatheter, PRN, Ejigiri, Ogechi Grace, PA-C   heparin injection 1,900 Units, 20 Units/kg, Dialysis, PRN, Theodoro Clock, Ogechi Grace, PA-C   hydrALAZINE (APRESOLINE) injection 5 mg, 5 mg, Intravenous, Q4H PRN, Nadara Mustard, MD   HYDROmorphone (DILAUDID) injection 0.5-1 mg, 0.5-1 mg, Intravenous, Q4H PRN, Nadara Mustard, MD, 1 mg at 11/19/22 0636   insulin aspart (novoLOG) injection 0-5 Units, 0-5 Units, Subcutaneous, QHS, Nadara Mustard, MD, 2 Units at 11/18/22 2158   insulin aspart (novoLOG) injection 0-6 Units, 0-6 Units, Subcutaneous, TID WC, Nadara Mustard, MD, 2 Units at 11/18/22 1718   insulin glargine-yfgn (SEMGLEE) injection 20 Units, 20 Units, Subcutaneous, QHS, Rodolph Bong, MD, 20 Units at 11/18/22 2237   insulin glargine-yfgn (SEMGLEE) injection 40 Units, 40 Units, Subcutaneous, Daily, Rodolph Bong, MD, 40 Units at 11/18/22 0814   ipratropium-albuterol (DUONEB) 0.5-2.5 (3) MG/3ML nebulizer solution 3 mL, 3 mL, Nebulization, Q6H PRN, Rodolph Bong, MD   levothyroxine (SYNTHROID) tablet 75 mcg, 75 mcg, Oral, QAC breakfast, Nadara Mustard, MD, 75 mcg at 11/19/22 0513   lidocaine (PF) (XYLOCAINE) 1 % injection 5 mL, 5 mL, Intradermal, PRN, Ejigiri, Ogechi Grace, PA-C   lidocaine-prilocaine (EMLA) cream 1 Application, 1 Application, Topical, PRN, Ejigiri, Ogechi Grace, PA-C   LORazepam (ATIVAN) tablet 0.5 mg, 0.5 mg, Oral, QHS, Aldean Baker V, MD, 0.5 mg at 11/18/22 2138   magnesium citrate solution 1 Bottle, 1 Bottle, Oral, Once PRN, Nadara Mustard, MD   methocarbamol (ROBAXIN) tablet 500 mg, 500 mg, Oral, Q6H PRN, 500 mg at 11/17/22 2227 **OR** methocarbamol (ROBAXIN) 500 mg in dextrose 5 % 50 mL IVPB, 500 mg, Intravenous, Q6H PRN, Nadara Mustard, MD   metoCLOPramide (REGLAN) tablet 5-10 mg,  5-10 mg, Oral, Q8H PRN **OR** metoCLOPramide (REGLAN) injection 5-10 mg, 5-10 mg, Intravenous, Q8H PRN, Nadara Mustard, MD   midodrine (PROAMATINE) tablet 10 mg, 10 mg, Oral, Q  T,Th,Sa-HD, Nadara Mustard, MD, 10 mg at 11/19/22 5621   multivitamin (RENA-VIT) tablet 1 tablet, 1 tablet, Oral, QHS, Rodolph Bong, MD, 1 tablet at 11/18/22 2138   oxyCODONE (Oxy IR/ROXICODONE) immediate release tablet 5 mg, 5 mg, Oral, Q4H PRN, Rodolph Bong, MD, 5 mg at 11/19/22 0513   pantoprazole (PROTONIX) EC tablet 40 mg, 40 mg, Oral, BID AC, Nadara Mustard, MD, 40 mg at 11/18/22 1707   pentafluoroprop-tetrafluoroeth (GEBAUERS) aerosol 1 Application, 1 Application, Topical, PRN, Ejigiri, Megan Mans, PA-C   polyethylene glycol (MIRALAX / GLYCOLAX) packet 17 g, 17 g, Oral, Daily PRN, Nadara Mustard, MD   polyethylene glycol (MIRALAX / GLYCOLAX) packet 17 g, 17 g, Oral, Daily, Rodolph Bong, MD, 17 g at 11/18/22 0815   pregabalin (LYRICA) capsule 150 mg, 150 mg, Oral, BID, Nadara Mustard, MD, 150 mg at 11/18/22 2138   ramelteon (ROZEREM) tablet 8 mg, 8 mg, Oral, QHS, Carrion-Carrero, Margely, MD, 8 mg at 11/18/22 2139   senna-docusate (Senokot-S) tablet 2 tablet, 2 tablet, Oral, BID, Nadara Mustard, MD, 2 tablet at 11/18/22 2138   sevelamer carbonate (RENVELA) tablet 1,600 mg, 1,600 mg, Oral, TID WC, Nadara Mustard, MD, 1,600 mg at 11/18/22 1707   sorbitol 70 % solution 30 mL, 30 mL, Oral, PRN, Nadara Mustard, MD   venlafaxine XR (EFFEXOR-XR) 24 hr capsule 75 mg, 75 mg, Oral, Q breakfast, Rodolph Bong, MD, 75 mg at 11/18/22 3086   zinc sulfate capsule 220 mg, 220 mg, Oral, Daily, Nadara Mustard, MD, 220 mg at 11/17/22 0801   zolpidem (AMBIEN) tablet 5 mg, 5 mg, Oral, QHS PRN, Nadara Mustard, MD   Patients Current Diet:  Diet Order                  Diet regular Room service appropriate? Yes; Fluid consistency: Thin; Fluid restriction: 1500 mL Fluid  Diet effective now                       Precautions / Restrictions Precautions Precautions: Fall Precaution Comments: LLE wound Restrictions Weight Bearing Restrictions: Yes LLE Weight Bearing: Weight bearing  as tolerated    Has the patient had 2 or more falls or a fall with injury in the past year? No   Prior Activity Level Limited Community (1-2x/wk): nonambulatory for 9 months since BKA. hoyer to wheelchair; sleeps in recliner   Prior Functional Level Self Care: Did the patient need help bathing, dressing, using the toilet or eating? Needed some help   Indoor Mobility: Did the patient need assistance with walking from room to room (with or without device)? Dependent   Stairs: Did the patient need assistance with internal or external stairs (with or without device)? Dependent   Functional Cognition: Did the patient need help planning regular tasks such as shopping or remembering to take medications? Needed some help   Patient Information Are you of Hispanic, Latino/a,or Spanish origin?: A. No, not of Hispanic, Latino/a, or Spanish origin What is your race?: A. White Do you need or want an interpreter to communicate with a doctor or health care staff?: 0. No   Patient's Response To:  Health Literacy and Transportation Is the patient able to respond to health literacy and transportation needs?:  Yes Health Literacy - How often do you need to have someone help you when you read instructions, pamphlets, or other written material from your doctor or pharmacy?: Never In the past 12 months, has lack of transportation kept you from medical appointments or from getting medications?: No In the past 12 months, has lack of transportation kept you from meetings, work, or from getting things needed for daily living?: No   Home Assistive Devices / Equipment Home Equipment: Grab bars - tub/shower, Agricultural consultant (2 wheels), Rollator (4 wheels), BSC/3in1, Cane - quad, Shower seat, Wheelchair - manual   Prior Device Use: Indicate devices/aids used by the patient prior to current illness, exacerbation or injury? Manual wheelchair and Mechanical lift   Current Functional Level Cognition   Overall  Cognitive Status: Within Functional Limits for tasks assessed Orientation Level: Oriented X4    Extremity Assessment (includes Sensation/Coordination)   Upper Extremity Assessment: RUE deficits/detail, LUE deficits/detail RUE Deficits / Details: B hand neuropathy, B shoudler pain with AROM, functional ROM RUE: Shoulder pain with ROM RUE Sensation: history of peripheral neuropathy RUE Coordination: decreased fine motor LUE Deficits / Details: B hand neuropathy, B shoudler pain with AROM, functional ROM LUE: Shoulder pain with ROM LUE Sensation: history of peripheral neuropathy LUE Coordination: decreased fine motor  Lower Extremity Assessment: RLE deficits/detail, LLE deficits/detail RLE Deficits / Details: hx BKA LLE Deficits / Details: Bandaged, gross weakness present, able to lift LE agains gravity through partial range. LLE: Unable to fully assess due to pain     ADLs   Overall ADL's : Needs assistance/impaired Eating/Feeding: Set up, Bed level Eating/Feeding Details (indicate cue type and reason): difficulty opening packages, drinks, hand weakness/neuropathy Grooming: Set up, Bed level Upper Body Bathing: Moderate assistance Lower Body Bathing: Maximal assistance Upper Body Dressing : Minimal assistance Lower Body Dressing: Maximal assistance Toilet Transfer: Maximal assistance, +2 for physical assistance, +2 for safety/equipment Toilet Transfer Details (indicate cue type and reason): with Stedy Toileting- Clothing Manipulation and Hygiene: Total assistance Functional mobility during ADLs: Maximal assistance, +2 for physical assistance, +2 for safety/equipment General ADL Comments: Pt requires significant assistance for ADLs, able to perform some UB ADLs, hand neuropathy and weak grip limt participation     Mobility   Overal bed mobility: Needs Assistance Bed Mobility: Rolling Rolling: Supervision, Mod assist Supine to sit: HOB elevated, Min assist Sit to supine: Min  guard General bed mobility comments: Performed rolling to L to reposition bed pad in the correct position.  Pt performed to the R with mod assistance.     Transfers   Overall transfer level:  (deferred as she felt dizzy after dialysis session.) Equipment used: Ambulation equipment used Transfers: Sit to/from Stand Sit to Stand: Max assist, +2 physical assistance, From elevated surface General transfer comment: Performed sit<>stand with Antony Salmon Max A +2 for the first 2 attempts with cues for technique and upright posture for full hip extenstion to neutral. 3rd attempt Mod assist +2, fear of fallnig forward. Cues for UE use to push upright. Tolerated each stand up to 10 seconds. Was not utilizing knee pad on Lt first two attempts, third attempt tolerated better with this block to assist for leverage.     Ambulation / Gait / Stairs / Wheelchair Mobility   Ambulation/Gait General Gait Details: non ambulatory for quite some time.     Posture / Balance Dynamic Sitting Balance Sitting balance - Comments: able to balance sitting EOB Balance Overall balance assessment: Needs assistance Sitting-balance support: No upper  extremity supported, Feet supported Sitting balance-Leahy Scale: Fair Sitting balance - Comments: able to balance sitting EOB Standing balance support: Bilateral upper extremity supported, Reliant on assistive device for balance Standing balance-Leahy Scale: Zero Standing balance comment: Held rail on stedy, Mod A +2 to stabilize.     Special needs/care consideration Date of Service: 11/10/2022  7:21 PM     Show:Clear all [x] Written[x] Templated[] Copied   Added by: [x] McNichol, Bonney Aid, RN   [] Hover for details WOC Nurse Consult Note: Reason for Consult:LLE with acute infestation of larvae. Dr. Lajoyce Corners is consulting and plans for surgical procedure on Wednesday or Friday of this week.Photodocumentation of wound is provided to the EMR by Provider and is appreciated. Also noted is  a partial thickness Stage 2 pressure injury to the sacrum, 2cm x 3cm and superficial, depth is 0.1cm Wound type:Infectious Pressure Injury POA: N/A Measurement:Last measurement by Dr, Audrie Lia staff is 5cm x 3cm with depth obscured by the presence of nonviable tissue. Wound RKY:[id number] tissue. Drainage (amount, consistency, odor) small serosanguinous Periwound:erythematous, edematous Dressing procedure/placement/frequency:I have provided Nursing with guidance in the care of this wound using sodium hypochlorite (Dakin's) solution to both cleanse and dress the wound. One-quarter strength sodium hypochlorite solution will be used twice daily to moisten gauze and be placed as a wound contact layer. This is to be covered with dry gauze and secured with Kerlix roll gauze/paper tape. Stage 2 PI to the sacrum will be treated with a daily cleanse followed by covering of the lesion with a xeroform gauze. This will be topped with a dry gauze and secured with a silicone foam. The xeroform is to be changed daily, The silicone foam may be reused for up to 3 days and changed PRN soiling.   WOC nursing team will not follow, but will remain available to this patient, the nursing and medical teams.  Please re-consult if needed.   Thank you for inviting [name] to participate in this patient's Plan of Care.   Ladona Mow, MSN, RN, CNS, GNP, CWOCN, CWON-AP, WOCNF, FAAN  Mobile phone:  [phone]       Wound VAC to LLE after surgical debridement Psychiatry consultation for med management [facility] notified to have prosthetic leg brought for fitting and use during CIR admit on 6/26 ( Bobby) Spouse pays privately for wheelchair transport to OP dialysis      Previous Home Environment  Living Arrangements: Spouse/significant other  Lives With: Spouse Available Help at Discharge: Family, Available 24 hours/day (spouse is on FMLA, may retire early when [employer] over; can provide 24/7) Type of Home:  House Home Layout: One level Home Access: Ramped entrance Bathroom Shower/Tub: Tub/shower unit, Door [employer] Toilet: Handicapped height Bathroom Accessibility: Yes How Accessible: Accessible via walker Additional Comments: Husband is out on FMLA until Sept. She was scheduled to get a prosthesis for RLE prior to admission   Discharge Living Setting Plans for Discharge Living Setting: Patient's home, Lives with (comment) (spouse) Type of Home at Discharge: House Discharge Home Layout: One level Discharge Home Access: Ramped entrance Discharge Bathroom Shower/Tub: Tub/shower unit Discharge Bathroom Toilet: Handicapped height Discharge Bathroom Accessibility: Yes How Accessible: Accessible via walker Does the patient have any problems obtaining your medications?: No   Social/Family/Support Systems Patient Roles: Spouse Contact Information: spouse. "Joe" Ramon Dredge Anticipated Caregiver: spouse Anticipated Industrial/product designer Information: see contacts Ability/Limitations of Caregiver: mod assist hoyer level he can provide Caregiver Availability: 24/7 Discharge Plan Discussed with Primary Caregiver: Yes Is Caregiver In Agreement with Plan?: Yes  Does Caregiver/Family have Issues with Lodging/Transportation while Pt is in Rehab?: No   Goals Patient/Family Goal for Rehab: min to mod assist with PT and OT at wheelchair level Expected length of stay: ELOS 2 weeks Additional Information: Spouse pays privately for whellchair transport to outpatient hemodialysis Pt/Family Agrees to Admission and willing to participate: Yes Program Orientation Provided & Reviewed with Pt/Caregiver Including Roles  & Responsibilities: Yes Additional Information Needs: [facility] has prosthetic leg for fitting. Needs to be utilized during her rehab stay/ AUth was given to incorporate use of prosthetic leg training  Barriers to Discharge: Insurance for SNF coverage (she has already used all 100 days of SNF coverage.  D/c'd home 09/23/22)   Decrease burden of Care through IP rehab admission: Decrease number of caregivers   Possible need for SNF placement upon discharge: no, has used all 100 days of SNF benefits. D/c'd form [facility] 09/23/22. Hoyer lift usage at home.    Patient Condition: I have reviewed medical records from [facility], spoken with  patient and spouse. I met with patient at the bedside for inpatient rehabilitation assessment.  Patient will benefit from ongoing PT and OT, can actively participate in 3 hours of therapy a day 5 days of the week, and can make measurable gains during the admission.  Patient will also benefit from the coordinated team approach during an Inpatient Acute Rehabilitation admission.  The patient will receive intensive therapy as well as Rehabilitation physician, nursing, social worker, and care management interventions.  Due to bladder management, bowel management, safety, skin/wound care, disease management, medication administration, pain management, and patient education the patient requires 24 hour a day rehabilitation nursing.  The patient is currently mod assist overall with mobility and basic ADLs.  Discharge setting and therapy post discharge at home with home health is anticipated.  Patient has agreed to participate in the Acute Inpatient Rehabilitation Program and will admit today.   Preadmission Screen Completed By:  Worthy Keeler MSN 11/19/2022 8:32 AM ______________________________________________________________________   Discussed status with Dr. Natale Lay on 11/19/22 at 1038 and received approval for admission today.   Admission Coordinator:  Clois Dupes, RN, MSN time 1038 Date 11/19/22    Assessment/Plan: Diagnosis: Debility due to L lower extremity wound infection/necrotic wound with maggots Does the need for close, 24 hr/day Medical supervision in concert with the patient's rehab needs make it unreasonable for this patient  to be served in a less intensive setting? Yes Co-Morbidities requiring supervision/potential complications: CAD, CHF diastolic, ESRD TTS HD, PVD, S/P R BKA, hypothyroidism, obesity, afib, DM2, afib, PVD, morbid obesity, MDD/GAD, dizziness, sacral pressure injury Due to bladder management, bowel management, safety, skin/wound care, disease management, medication administration, pain management, and patient education, does the patient require 24 hr/day rehab nursing? Yes Does the patient require coordinated care of a physician, rehab nurse, PT, OT, and SLP to address physical and functional deficits in the context of the above medical diagnosis(es)? Yes Addressing deficits in the following areas: balance, endurance, locomotion, strength, transferring, bowel/bladder control, bathing, dressing, feeding, grooming, toileting, and psychosocial support Can the patient actively participate in an intensive therapy program of at least 3 hrs of therapy 5 days a week? Yes The potential for patient to make measurable gains while on inpatient rehab is excellent Anticipated functional outcomes upon discharge from inpatient rehab: min assist and mod assist PT, min assist and mod assist OT, n/a SLP Estimated rehab length of stay to reach the above functional goals is: 14  days Anticipated discharge destination: Home 10. Overall Rehab/Functional Prognosis: good     MD Signature: Benjie Karvonen Shtridelman  Admitting 11/20/22 due to BP issues 6/26 after hemodialysis. Dr Shearon Stalls notified at 1231 on 11/20/22 of admission.  Ottie Glazier RN MSN         Revision History

## 2022-11-20 ENCOUNTER — Encounter (HOSPITAL_COMMUNITY): Payer: Self-pay | Admitting: Physical Medicine & Rehabilitation

## 2022-11-20 ENCOUNTER — Other Ambulatory Visit: Payer: Self-pay

## 2022-11-20 ENCOUNTER — Inpatient Hospital Stay (HOSPITAL_COMMUNITY)
Admission: RE | Admit: 2022-11-20 | Discharge: 2022-12-12 | DRG: 559 | Disposition: A | Discharge status: Home Health | Payer: PPO | Source: Intra-hospital | Attending: Physical Medicine & Rehabilitation | Admitting: Physical Medicine & Rehabilitation

## 2022-11-20 DIAGNOSIS — E1122 Type 2 diabetes mellitus with diabetic chronic kidney disease: Secondary | ICD-10-CM | POA: Diagnosis present

## 2022-11-20 DIAGNOSIS — I480 Paroxysmal atrial fibrillation: Secondary | ICD-10-CM | POA: Diagnosis present

## 2022-11-20 DIAGNOSIS — I5022 Chronic systolic (congestive) heart failure: Secondary | ICD-10-CM | POA: Diagnosis not present

## 2022-11-20 DIAGNOSIS — K219 Gastro-esophageal reflux disease without esophagitis: Secondary | ICD-10-CM | POA: Diagnosis not present

## 2022-11-20 DIAGNOSIS — E785 Hyperlipidemia, unspecified: Secondary | ICD-10-CM | POA: Diagnosis not present

## 2022-11-20 DIAGNOSIS — I5042 Chronic combined systolic (congestive) and diastolic (congestive) heart failure: Secondary | ICD-10-CM | POA: Diagnosis not present

## 2022-11-20 DIAGNOSIS — I5082 Biventricular heart failure: Secondary | ICD-10-CM | POA: Diagnosis present

## 2022-11-20 DIAGNOSIS — K5900 Constipation, unspecified: Secondary | ICD-10-CM | POA: Diagnosis not present

## 2022-11-20 DIAGNOSIS — I5032 Chronic diastolic (congestive) heart failure: Secondary | ICD-10-CM | POA: Diagnosis not present

## 2022-11-20 DIAGNOSIS — D631 Anemia in chronic kidney disease: Secondary | ICD-10-CM | POA: Diagnosis present

## 2022-11-20 DIAGNOSIS — I2510 Atherosclerotic heart disease of native coronary artery without angina pectoris: Secondary | ICD-10-CM | POA: Diagnosis present

## 2022-11-20 DIAGNOSIS — F411 Generalized anxiety disorder: Secondary | ICD-10-CM | POA: Diagnosis not present

## 2022-11-20 DIAGNOSIS — R253 Fasciculation: Secondary | ICD-10-CM | POA: Diagnosis present

## 2022-11-20 DIAGNOSIS — I953 Hypotension of hemodialysis: Secondary | ICD-10-CM | POA: Diagnosis not present

## 2022-11-20 DIAGNOSIS — Z8249 Family history of ischemic heart disease and other diseases of the circulatory system: Secondary | ICD-10-CM

## 2022-11-20 DIAGNOSIS — N186 End stage renal disease: Secondary | ICD-10-CM | POA: Diagnosis not present

## 2022-11-20 DIAGNOSIS — D638 Anemia in other chronic diseases classified elsewhere: Secondary | ICD-10-CM | POA: Diagnosis not present

## 2022-11-20 DIAGNOSIS — M79605 Pain in left leg: Secondary | ICD-10-CM | POA: Diagnosis not present

## 2022-11-20 DIAGNOSIS — S81802A Unspecified open wound, left lower leg, initial encounter: Secondary | ICD-10-CM

## 2022-11-20 DIAGNOSIS — E669 Obesity, unspecified: Secondary | ICD-10-CM | POA: Diagnosis present

## 2022-11-20 DIAGNOSIS — Z8719 Personal history of other diseases of the digestive system: Secondary | ICD-10-CM | POA: Diagnosis not present

## 2022-11-20 DIAGNOSIS — M898X9 Other specified disorders of bone, unspecified site: Secondary | ICD-10-CM | POA: Diagnosis present

## 2022-11-20 DIAGNOSIS — E1165 Type 2 diabetes mellitus with hyperglycemia: Secondary | ICD-10-CM | POA: Diagnosis not present

## 2022-11-20 DIAGNOSIS — I739 Peripheral vascular disease, unspecified: Secondary | ICD-10-CM | POA: Diagnosis not present

## 2022-11-20 DIAGNOSIS — T148XXA Other injury of unspecified body region, initial encounter: Secondary | ICD-10-CM | POA: Diagnosis not present

## 2022-11-20 DIAGNOSIS — M6281 Muscle weakness (generalized): Secondary | ICD-10-CM | POA: Diagnosis not present

## 2022-11-20 DIAGNOSIS — J9610 Chronic respiratory failure, unspecified whether with hypoxia or hypercapnia: Secondary | ICD-10-CM | POA: Diagnosis present

## 2022-11-20 DIAGNOSIS — Z79890 Hormone replacement therapy (postmenopausal): Secondary | ICD-10-CM

## 2022-11-20 DIAGNOSIS — I959 Hypotension, unspecified: Secondary | ICD-10-CM | POA: Diagnosis present

## 2022-11-20 DIAGNOSIS — X58XXXD Exposure to other specified factors, subsequent encounter: Secondary | ICD-10-CM | POA: Diagnosis present

## 2022-11-20 DIAGNOSIS — S81802D Unspecified open wound, left lower leg, subsequent encounter: Secondary | ICD-10-CM | POA: Diagnosis not present

## 2022-11-20 DIAGNOSIS — Z89511 Acquired absence of right leg below knee: Secondary | ICD-10-CM

## 2022-11-20 DIAGNOSIS — Z992 Dependence on renal dialysis: Secondary | ICD-10-CM

## 2022-11-20 DIAGNOSIS — B964 Proteus (mirabilis) (morganii) as the cause of diseases classified elsewhere: Secondary | ICD-10-CM | POA: Diagnosis present

## 2022-11-20 DIAGNOSIS — E1142 Type 2 diabetes mellitus with diabetic polyneuropathy: Secondary | ICD-10-CM | POA: Diagnosis not present

## 2022-11-20 DIAGNOSIS — F32A Depression, unspecified: Secondary | ICD-10-CM | POA: Diagnosis present

## 2022-11-20 DIAGNOSIS — E119 Type 2 diabetes mellitus without complications: Secondary | ICD-10-CM | POA: Diagnosis not present

## 2022-11-20 DIAGNOSIS — Z79899 Other long term (current) drug therapy: Secondary | ICD-10-CM

## 2022-11-20 DIAGNOSIS — L89152 Pressure ulcer of sacral region, stage 2: Secondary | ICD-10-CM | POA: Diagnosis present

## 2022-11-20 DIAGNOSIS — I132 Hypertensive heart and chronic kidney disease with heart failure and with stage 5 chronic kidney disease, or end stage renal disease: Secondary | ICD-10-CM | POA: Diagnosis present

## 2022-11-20 DIAGNOSIS — I252 Old myocardial infarction: Secondary | ICD-10-CM

## 2022-11-20 DIAGNOSIS — S81002D Unspecified open wound, left knee, subsequent encounter: Secondary | ICD-10-CM | POA: Diagnosis not present

## 2022-11-20 DIAGNOSIS — F5104 Psychophysiologic insomnia: Secondary | ICD-10-CM | POA: Diagnosis present

## 2022-11-20 DIAGNOSIS — Z4781 Encounter for orthopedic aftercare following surgical amputation: Secondary | ICD-10-CM | POA: Diagnosis not present

## 2022-11-20 DIAGNOSIS — F410 Panic disorder [episodic paroxysmal anxiety] without agoraphobia: Secondary | ICD-10-CM | POA: Diagnosis present

## 2022-11-20 DIAGNOSIS — Z794 Long term (current) use of insulin: Secondary | ICD-10-CM

## 2022-11-20 DIAGNOSIS — Z6835 Body mass index (BMI) 35.0-35.9, adult: Secondary | ICD-10-CM

## 2022-11-20 DIAGNOSIS — I5043 Acute on chronic combined systolic (congestive) and diastolic (congestive) heart failure: Secondary | ICD-10-CM | POA: Diagnosis not present

## 2022-11-20 DIAGNOSIS — R252 Cramp and spasm: Secondary | ICD-10-CM | POA: Diagnosis not present

## 2022-11-20 DIAGNOSIS — E11649 Type 2 diabetes mellitus with hypoglycemia without coma: Secondary | ICD-10-CM | POA: Diagnosis not present

## 2022-11-20 DIAGNOSIS — N250 Renal osteodystrophy: Secondary | ICD-10-CM | POA: Diagnosis not present

## 2022-11-20 DIAGNOSIS — Z87442 Personal history of urinary calculi: Secondary | ICD-10-CM

## 2022-11-20 DIAGNOSIS — Z7901 Long term (current) use of anticoagulants: Secondary | ICD-10-CM

## 2022-11-20 DIAGNOSIS — Z87891 Personal history of nicotine dependence: Secondary | ICD-10-CM

## 2022-11-20 DIAGNOSIS — Z8673 Personal history of transient ischemic attack (TIA), and cerebral infarction without residual deficits: Secondary | ICD-10-CM

## 2022-11-20 DIAGNOSIS — Z90710 Acquired absence of both cervix and uterus: Secondary | ICD-10-CM

## 2022-11-20 DIAGNOSIS — M1990 Unspecified osteoarthritis, unspecified site: Secondary | ICD-10-CM | POA: Diagnosis present

## 2022-11-20 DIAGNOSIS — K626 Ulcer of anus and rectum: Secondary | ICD-10-CM | POA: Diagnosis not present

## 2022-11-20 DIAGNOSIS — L089 Local infection of the skin and subcutaneous tissue, unspecified: Secondary | ICD-10-CM | POA: Diagnosis not present

## 2022-11-20 DIAGNOSIS — D649 Anemia, unspecified: Secondary | ICD-10-CM

## 2022-11-20 DIAGNOSIS — Z9981 Dependence on supplemental oxygen: Secondary | ICD-10-CM

## 2022-11-20 DIAGNOSIS — G546 Phantom limb syndrome with pain: Secondary | ICD-10-CM | POA: Diagnosis present

## 2022-11-20 DIAGNOSIS — R110 Nausea: Secondary | ICD-10-CM | POA: Diagnosis not present

## 2022-11-20 DIAGNOSIS — N179 Acute kidney failure, unspecified: Secondary | ICD-10-CM | POA: Diagnosis not present

## 2022-11-20 DIAGNOSIS — R739 Hyperglycemia, unspecified: Secondary | ICD-10-CM | POA: Diagnosis not present

## 2022-11-20 DIAGNOSIS — M869 Osteomyelitis, unspecified: Secondary | ICD-10-CM | POA: Diagnosis not present

## 2022-11-20 DIAGNOSIS — S91002D Unspecified open wound, left ankle, subsequent encounter: Secondary | ICD-10-CM | POA: Diagnosis not present

## 2022-11-20 DIAGNOSIS — I120 Hypertensive chronic kidney disease with stage 5 chronic kidney disease or end stage renal disease: Secondary | ICD-10-CM | POA: Diagnosis not present

## 2022-11-20 DIAGNOSIS — I509 Heart failure, unspecified: Secondary | ICD-10-CM | POA: Diagnosis not present

## 2022-11-20 DIAGNOSIS — J9611 Chronic respiratory failure with hypoxia: Secondary | ICD-10-CM | POA: Diagnosis not present

## 2022-11-20 DIAGNOSIS — E039 Hypothyroidism, unspecified: Secondary | ICD-10-CM | POA: Diagnosis present

## 2022-11-20 DIAGNOSIS — Z713 Dietary counseling and surveillance: Secondary | ICD-10-CM

## 2022-11-20 DIAGNOSIS — Z955 Presence of coronary angioplasty implant and graft: Secondary | ICD-10-CM

## 2022-11-20 LAB — CBC
HCT: 28.2 % — ABNORMAL LOW (ref 36.0–46.0)
Hemoglobin: 8.5 g/dL — ABNORMAL LOW (ref 12.0–15.0)
MCH: 24.6 pg — ABNORMAL LOW (ref 26.0–34.0)
MCHC: 30.1 g/dL (ref 30.0–36.0)
MCV: 81.5 fL (ref 80.0–100.0)
Platelets: 187 10*3/uL (ref 150–400)
RBC: 3.46 MIL/uL — ABNORMAL LOW (ref 3.87–5.11)
RDW: 17.1 % — ABNORMAL HIGH (ref 11.5–15.5)
WBC: 5.9 10*3/uL (ref 4.0–10.5)
nRBC: 0 % (ref 0.0–0.2)

## 2022-11-20 LAB — BASIC METABOLIC PANEL
Anion gap: 11 (ref 5–15)
BUN: 35 mg/dL — ABNORMAL HIGH (ref 8–23)
CO2: 26 mmol/L (ref 22–32)
Calcium: 8.9 mg/dL (ref 8.9–10.3)
Chloride: 95 mmol/L — ABNORMAL LOW (ref 98–111)
Creatinine, Ser: 3.63 mg/dL — ABNORMAL HIGH (ref 0.44–1.00)
GFR, Estimated: 13 mL/min — ABNORMAL LOW (ref >60)
Glucose, Bld: 330 mg/dL — ABNORMAL HIGH (ref 70–99)
Potassium: 4.2 mmol/L (ref 3.5–5.1)
Sodium: 132 mmol/L — ABNORMAL LOW (ref 135–145)

## 2022-11-20 LAB — GLUCOSE, CAPILLARY
Glucose-Capillary: 252 mg/dL — ABNORMAL HIGH (ref 70–99)
Glucose-Capillary: 248 mg/dL — ABNORMAL HIGH (ref 70–99)
Glucose-Capillary: 164 mg/dL — ABNORMAL HIGH (ref 70–99)
Glucose-Capillary: 202 mg/dL — ABNORMAL HIGH (ref 70–99)

## 2022-11-20 LAB — LACTIC ACID, PLASMA: Lactic Acid, Venous: 0.9 mmol/L (ref 0.5–1.9)

## 2022-11-20 LAB — MAGNESIUM: Magnesium: 1.9 mg/dL (ref 1.7–2.4)

## 2022-11-20 MED ORDER — ZINC SULFATE 220 (50 ZN) MG PO CAPS
220.0000 mg | ORAL_CAPSULE | Freq: Every day | ORAL | Status: AC
  Administered 2022-11-21 – 2022-12-02 (×11): 220 mg via ORAL
  Filled 2022-11-20 (×12): qty 1

## 2022-11-20 MED ORDER — ACETAMINOPHEN 325 MG PO TABS
650.0000 mg | ORAL_TABLET | Freq: Four times a day (QID) | ORAL | Status: DC | PRN
  Administered 2022-11-23: 650 mg via ORAL
  Filled 2022-11-20: qty 2

## 2022-11-20 MED ORDER — METOCLOPRAMIDE HCL 5 MG/ML IJ SOLN: 5.0000 mg | Freq: Three times a day (TID) | INTRAMUSCULAR | Status: DC | PRN

## 2022-11-20 MED ORDER — POLYETHYLENE GLYCOL 3350 17 G PO PACK
17.0000 g | PACK | Freq: Every day | ORAL | Status: DC
  Administered 2022-11-21 – 2022-11-24 (×3): 17 g via ORAL
  Filled 2022-11-20 (×4): qty 1

## 2022-11-20 MED ORDER — MIDODRINE HCL 5 MG PO TABS
10.0000 mg | ORAL_TABLET | Freq: Once | ORAL | Status: AC
  Administered 2022-11-20: 10 mg via ORAL
  Filled 2022-11-20: qty 2

## 2022-11-20 MED ORDER — PREGABALIN 75 MG PO CAPS: 75.0000 mg | ORAL_CAPSULE | Freq: Two times a day (BID) | ORAL | Status: DC

## 2022-11-20 MED ORDER — MIDODRINE HCL 5 MG PO TABS
5.0000 mg | ORAL_TABLET | ORAL | Status: AC
  Administered 2022-11-20: 5 mg via ORAL
  Filled 2022-11-20: qty 1

## 2022-11-20 MED ORDER — LEVOTHYROXINE SODIUM 75 MCG PO TABS
75.0000 ug | ORAL_TABLET | Freq: Every day | ORAL | Status: DC
  Administered 2022-11-21 – 2022-12-12 (×22): 75 ug via ORAL
  Filled 2022-11-20 (×22): qty 1

## 2022-11-20 MED ORDER — SODIUM CHLORIDE 0.9 % IV BOLUS
250.0000 mL | Freq: Once | INTRAVENOUS | Status: AC
  Administered 2022-11-20: 250 mL via INTRAVENOUS

## 2022-11-20 MED ORDER — VITAMIN C 500 MG PO TABS
500.0000 mg | ORAL_TABLET | Freq: Every day | ORAL | Status: DC
  Administered 2022-11-21 – 2022-12-12 (×21): 500 mg via ORAL
  Filled 2022-11-20 (×22): qty 1

## 2022-11-20 MED ORDER — VENLAFAXINE HCL ER 75 MG PO CP24
75.0000 mg | ORAL_CAPSULE | Freq: Every day | ORAL | Status: DC
  Administered 2022-11-21 – 2022-12-12 (×21): 75 mg via ORAL
  Filled 2022-11-20 (×22): qty 1

## 2022-11-20 MED ORDER — SEVELAMER CARBONATE 800 MG PO TABS
1600.0000 mg | ORAL_TABLET | Freq: Three times a day (TID) | ORAL | Status: DC
  Administered 2022-11-21 – 2022-12-11 (×50): 1600 mg via ORAL
  Filled 2022-11-20 (×57): qty 2

## 2022-11-20 MED ORDER — HEPARIN SODIUM (PORCINE) 1000 UNIT/ML DIALYSIS
4000.0000 [IU] | INTRAMUSCULAR | Status: DC | PRN
  Administered 2022-11-20: 4000 [IU] via INTRAVENOUS_CENTRAL
  Filled 2022-11-20 (×2): qty 4

## 2022-11-20 MED ORDER — ACETAMINOPHEN 650 MG RE SUPP: 650.0000 mg | Freq: Four times a day (QID) | RECTAL | Status: DC | PRN

## 2022-11-20 MED ORDER — INSULIN GLARGINE-YFGN 100 UNIT/ML ~~LOC~~ SOLN
40.0000 [IU] | Freq: Every day | SUBCUTANEOUS | Status: DC
  Administered 2022-11-21 – 2022-11-27 (×7): 40 [IU] via SUBCUTANEOUS
  Filled 2022-11-20 (×7): qty 0.4

## 2022-11-20 MED ORDER — ALBUMIN HUMAN 25 % IV SOLN
25.0000 g | Freq: Once | INTRAVENOUS | Status: AC
  Administered 2022-11-20: 25 g via INTRAVENOUS
  Filled 2022-11-20: qty 100

## 2022-11-20 MED ORDER — HEPARIN SODIUM (PORCINE) 1000 UNIT/ML DIALYSIS
4000.0000 [IU] | INTRAMUSCULAR | Status: DC | PRN
  Filled 2022-11-20: qty 4

## 2022-11-20 MED ORDER — SENNOSIDES-DOCUSATE SODIUM 8.6-50 MG PO TABS
2.0000 | ORAL_TABLET | Freq: Two times a day (BID) | ORAL | Status: DC
  Administered 2022-11-20 – 2022-12-07 (×29): 2 via ORAL
  Filled 2022-11-20 (×33): qty 2

## 2022-11-20 MED ORDER — MIDODRINE HCL 5 MG PO TABS
10.0000 mg | ORAL_TABLET | ORAL | Status: DC
  Administered 2022-11-25 – 2022-12-11 (×8): 10 mg via ORAL
  Filled 2022-11-20 (×10): qty 2

## 2022-11-20 MED ORDER — RENA-VITE PO TABS
1.0000 | ORAL_TABLET | Freq: Every day | ORAL | Status: DC
  Administered 2022-11-20 – 2022-12-06 (×17): 1 via ORAL
  Filled 2022-11-20 (×17): qty 1

## 2022-11-20 MED ORDER — NEPRO/CARBSTEADY PO LIQD
237.0000 mL | Freq: Two times a day (BID) | ORAL | Status: DC
  Administered 2022-11-21 – 2022-12-11 (×27): 237 mL via ORAL

## 2022-11-20 MED ORDER — OXYCODONE HCL 5 MG PO TABS
5.0000 mg | ORAL_TABLET | ORAL | Status: DC | PRN
  Filled 2022-11-20: qty 1

## 2022-11-20 MED ORDER — FOLIC ACID 1 MG PO TABS
1.0000 mg | ORAL_TABLET | Freq: Every day | ORAL | Status: DC
  Administered 2022-11-21 – 2022-12-12 (×21): 1 mg via ORAL
  Filled 2022-11-20 (×22): qty 1

## 2022-11-20 MED ORDER — HEPARIN SODIUM (PORCINE) 1000 UNIT/ML DIALYSIS: 1000.0000 [IU] | INTRAMUSCULAR | Status: DC | PRN

## 2022-11-20 MED ORDER — ATORVASTATIN CALCIUM 80 MG PO TABS
80.0000 mg | ORAL_TABLET | Freq: Every day | ORAL | Status: DC
  Administered 2022-11-21 – 2022-12-12 (×21): 80 mg via ORAL
  Filled 2022-11-20 (×22): qty 1

## 2022-11-20 MED ORDER — PANTOPRAZOLE SODIUM 40 MG PO TBEC
40.0000 mg | DELAYED_RELEASE_TABLET | Freq: Two times a day (BID) | ORAL | Status: DC
  Administered 2022-11-21 – 2022-12-12 (×38): 40 mg via ORAL
  Filled 2022-11-20 (×41): qty 1

## 2022-11-20 MED ORDER — IPRATROPIUM-ALBUTEROL 0.5-2.5 (3) MG/3ML IN SOLN: 3.0000 mL | Freq: Four times a day (QID) | RESPIRATORY_TRACT | Status: DC | PRN

## 2022-11-20 MED ORDER — INSULIN ASPART 100 UNIT/ML IJ SOLN
0.0000 [IU] | Freq: Three times a day (TID) | INTRAMUSCULAR | Status: DC
  Administered 2022-11-21: 1 [IU] via SUBCUTANEOUS
  Administered 2022-11-21: 2 [IU] via SUBCUTANEOUS
  Administered 2022-11-21: 3 [IU] via SUBCUTANEOUS
  Administered 2022-11-22 (×2): 2 [IU] via SUBCUTANEOUS
  Administered 2022-11-23 – 2022-11-27 (×3): 1 [IU] via SUBCUTANEOUS
  Administered 2022-11-28: 2 [IU] via SUBCUTANEOUS
  Administered 2022-11-28: 1 [IU] via SUBCUTANEOUS
  Administered 2022-11-28: 2 [IU] via SUBCUTANEOUS
  Administered 2022-11-29: 1 [IU] via SUBCUTANEOUS
  Administered 2022-11-30: 4 [IU] via SUBCUTANEOUS
  Administered 2022-11-30: 5 [IU] via SUBCUTANEOUS
  Administered 2022-11-30: 4 [IU] via SUBCUTANEOUS
  Administered 2022-12-01: 3 [IU] via SUBCUTANEOUS
  Administered 2022-12-01: 5 [IU] via SUBCUTANEOUS
  Administered 2022-12-01 – 2022-12-02 (×3): 2 [IU] via SUBCUTANEOUS
  Administered 2022-12-03: 3 [IU] via SUBCUTANEOUS
  Administered 2022-12-03: 2 [IU] via SUBCUTANEOUS
  Administered 2022-12-03: 3 [IU] via SUBCUTANEOUS
  Administered 2022-12-04 – 2022-12-05 (×4): 1 [IU] via SUBCUTANEOUS
  Administered 2022-12-06 (×2): 3 [IU] via SUBCUTANEOUS
  Administered 2022-12-07: 1 [IU] via SUBCUTANEOUS
  Administered 2022-12-07: 3 [IU] via SUBCUTANEOUS
  Administered 2022-12-07: 2 [IU] via SUBCUTANEOUS
  Administered 2022-12-08: 4 [IU] via SUBCUTANEOUS
  Administered 2022-12-08: 2 [IU] via SUBCUTANEOUS
  Administered 2022-12-09: 3 [IU] via SUBCUTANEOUS
  Administered 2022-12-09: 2 [IU] via SUBCUTANEOUS
  Administered 2022-12-11 – 2022-12-12 (×2): 1 [IU] via SUBCUTANEOUS

## 2022-11-20 MED ORDER — HEPARIN SODIUM (PORCINE) 1000 UNIT/ML IJ SOLN
3800.0000 [IU] | Freq: Once | INTRAMUSCULAR | Status: AC
  Administered 2022-11-20: 3800 [IU]
  Filled 2022-11-20: qty 4

## 2022-11-20 MED ORDER — INSULIN GLARGINE-YFGN 100 UNIT/ML ~~LOC~~ SOLN
20.0000 [IU] | Freq: Every day | SUBCUTANEOUS | Status: DC
  Administered 2022-11-20: 20 [IU] via SUBCUTANEOUS
  Filled 2022-11-20 (×2): qty 0.2

## 2022-11-20 MED ORDER — RAMELTEON 8 MG PO TABS
8.0000 mg | ORAL_TABLET | Freq: Every day | ORAL | Status: DC
  Administered 2022-11-20 – 2022-12-11 (×22): 8 mg via ORAL
  Filled 2022-11-20 (×24): qty 1

## 2022-11-20 MED ORDER — CALCIUM CARBONATE ANTACID 1250 MG/5ML PO SUSP: 500.0000 mg | Freq: Four times a day (QID) | ORAL | Status: DC | PRN

## 2022-11-20 MED ORDER — HEPARIN SODIUM (PORCINE) 1000 UNIT/ML DIALYSIS
1000.0000 [IU] | INTRAMUSCULAR | Status: DC | PRN
  Administered 2022-11-24 – 2022-12-09 (×4): 3800 [IU]
  Filled 2022-11-20 (×9): qty 1

## 2022-11-20 MED ORDER — METHOCARBAMOL 500 MG PO TABS
500.0000 mg | ORAL_TABLET | Freq: Four times a day (QID) | ORAL | Status: DC | PRN
  Administered 2022-11-26 – 2022-12-01 (×10): 500 mg via ORAL
  Filled 2022-11-20 (×9): qty 1

## 2022-11-20 MED ORDER — OXYCODONE HCL 5 MG PO TABS: 5.0000 mg | ORAL_TABLET | Freq: Four times a day (QID) | ORAL | 0 refills | Status: DC | PRN

## 2022-11-20 MED ORDER — PROSOURCE PLUS PO LIQD
30.0000 mL | Freq: Two times a day (BID) | ORAL | Status: DC
  Administered 2022-11-21 – 2022-12-07 (×21): 30 mL via ORAL
  Filled 2022-11-20 (×22): qty 30

## 2022-11-20 MED ORDER — SORBITOL 70 % SOLN: 30.0000 mL | Status: DC | PRN

## 2022-11-20 MED ORDER — METOCLOPRAMIDE HCL 5 MG PO TABS
5.0000 mg | ORAL_TABLET | Freq: Three times a day (TID) | ORAL | Status: DC | PRN
  Administered 2022-11-23 – 2022-11-29 (×3): 10 mg via ORAL
  Administered 2022-12-05: 5 mg via ORAL
  Filled 2022-11-20 (×4): qty 2

## 2022-11-20 MED ORDER — POLYETHYLENE GLYCOL 3350 17 G PO PACK: 17.0000 g | PACK | Freq: Every day | ORAL | Status: DC | PRN

## 2022-11-20 MED ORDER — AMIODARONE HCL 200 MG PO TABS
200.0000 mg | ORAL_TABLET | Freq: Every day | ORAL | Status: DC
  Administered 2022-11-21 – 2022-12-12 (×22): 200 mg via ORAL
  Filled 2022-11-20 (×23): qty 1

## 2022-11-20 MED ORDER — APIXABAN 5 MG PO TABS
5.0000 mg | ORAL_TABLET | Freq: Two times a day (BID) | ORAL | Status: DC
  Administered 2022-11-20 – 2022-12-12 (×43): 5 mg via ORAL
  Filled 2022-11-20 (×44): qty 1

## 2022-11-20 NOTE — Discharge Summary (Signed)
Discharge Diagnoses:  Principal Problem:   Complicated wound infection Active Problems:   Hyperlipemia   Hypotension   Impaired ambulation   Hypothyroidism   Prolonged QT interval   Type 2 diabetes mellitus with diabetic polyneuropathy, with long-term current use of insulin (HCC)   Obesity, Class III, BMI 40-49.9 (morbid obesity) (HCC)   Paroxysmal atrial fibrillation (HCC)   S/P BKA (below knee amputation) unilateral, right (HCC)   ESRD on dialysis (HCC)   Maggot infestation   Cellulitis of left lower extremity   Surgeries: Procedure(s): IRRIGATION AND DEBRIDEMENT OF LOWER EXTREMITY WOUND APPLICATION OF WOUND VAC on 11/14/2022    Consultants: Treatment Team:  Bufford Buttner, MD Nadara Mustard, MD Mariel Craft, MD Delano Metz, MD  Discharged Condition: Improved  Hospital Course: Susan [name] is an 72 y.o. female who was admitted 11/10/2022 with a chief complaint of ulceration, cellulitis left leg, with a final diagnosis of cellulitis of left lower extremity.  Patient was brought to the operating room on 11/14/2022 and underwent Procedure(s): IRRIGATION AND DEBRIDEMENT OF LOWER EXTREMITY WOUND APPLICATION OF WOUND VAC.    Patient was given perioperative antibiotics:  Anti-infectives (From admission, onward)    Start     Dose/Rate Route Frequency Ordered Stop   11/19/22 1700  ceFAZolin (ANCEF) IVPB 2g/100 mL premix        2 g 200 mL/hr over 30 Minutes Intravenous  Once 11/19/22 1613 11/19/22 1820   11/19/22 1315  ceFAZolin (ANCEF) IVPB 2g/100 mL premix  Status:  Discontinued        2 g 200 mL/hr over 30 Minutes Intravenous  Once 11/19/22 1223 11/19/22 1259   11/18/22 1800  ceFAZolin (ANCEF) IVPB 2g/100 mL premix  Status:  Discontinued        2 g 200 mL/hr over 30 Minutes Intravenous Every T-Th-Sa (1800) 11/17/22 1139 11/19/22 1259   11/18/22 0000  ceFAZolin (ANCEF) IVPB        2 g Intravenous Every T-Th-Sa (Hemodialysis) 11/17/22 1150 11/28/22 2359   11/17/22  1230  ceFAZolin (ANCEF) IVPB 1 g/50 mL premix        1 g 100 mL/hr over 30 Minutes Intravenous  Once 11/17/22 1139 11/17/22 1245   11/15/22 1800  ceFEPIme (MAXIPIME) 2 g in sodium chloride 0.9 % 100 mL IVPB  Status:  Discontinued        2 g 200 mL/hr over 30 Minutes Intravenous Every T-Th-Sa (1800) 11/14/22 1233 11/17/22 1139   11/14/22 2300  vancomycin (VANCOCIN) IVPB 1000 mg/200 mL premix        1,000 mg 200 mL/hr over 60 Minutes Intravenous  Once 11/14/22 2207 11/15/22 0021   11/14/22 1500  vancomycin (VANCOCIN) IVPB 1000 mg/200 mL premix  Status:  Discontinued        1,000 mg 200 mL/hr over 60 Minutes Intravenous  Once 11/14/22 1433 11/14/22 2207   11/14/22 1400  metroNIDAZOLE (FLAGYL) IVPB 500 mg  Status:  Discontinued        500 mg 100 mL/hr over 60 Minutes Intravenous Every 12 hours 11/14/22 1221 11/17/22 1252   11/14/22 1400  ceFEPIme (MAXIPIME) 2 g in sodium chloride 0.9 % 100 mL IVPB        2 g 200 mL/hr over 30 Minutes Intravenous  Once 11/14/22 1233 11/15/22 1857   11/14/22 0923  vancomycin (VANCOCIN) powder  Status:  Discontinued          As needed 11/14/22 0924 11/14/22 0930   11/14/22 0745  vancomycin (VANCOCIN)  IVPB 1000 mg/200 mL premix  Status:  Discontinued        1,000 mg 200 mL/hr over 60 Minutes Intravenous On call to O.R. 11/14/22 0631 11/14/22 1032   11/14/22 0730  levofloxacin (LEVAQUIN) IVPB 500 mg  Status:  Discontinued        500 mg 100 mL/hr over 60 Minutes Intravenous On call to O.R. 11/14/22 0631 11/14/22 1032   11/13/22 1730  fluconazole (DIFLUCAN) tablet 150 mg        150 mg Oral  Once 11/13/22 1630 11/13/22 1707   11/12/22 1145  vancomycin (VANCOCIN) IVPB 1000 mg/200 mL premix  Status:  Discontinued        1,000 mg 200 mL/hr over 60 Minutes Intravenous On call to O.R. 11/12/22 1052 11/12/22 1055   11/12/22 1145  levofloxacin (LEVAQUIN) IVPB 500 mg  Status:  Discontinued        500 mg 100 mL/hr over 60 Minutes Intravenous On call to O.R. 11/12/22 1052  11/12/22 1055   11/12/22 1054  vancomycin (VANCOCIN) 1-5 GM/200ML-% IVPB  Status:  Discontinued       Note to Pharmacy: [facility], [place]: cabinet override      11/12/22 1054 11/12/22 1114   11/12/22 0815  vancomycin (VANCOCIN) IVPB 1000 mg/200 mL premix        1,000 mg 200 mL/hr over 60 Minutes Intravenous  Once 11/12/22 0718 11/12/22 2040   11/11/22 1354  vancomycin (VANCOCIN) IVPB 1000 mg/200 mL premix  Status:  Discontinued        1,000 mg 200 mL/hr over 60 Minutes Intravenous Every T-Th-Sa (Hemodialysis) 11/11/22 1354 11/16/22 1645   11/10/22 1700  piperacillin-tazobactam (ZOSYN) IVPB 2.25 g  Status:  Discontinued        2.25 g 100 mL/hr over 30 Minutes Intravenous Every 8 hours 11/10/22 1645 11/14/22 1221   11/10/22 1645  vancomycin (VANCOREADY) IVPB 2000 mg/400 mL        2,000 mg 200 mL/hr over 120 Minutes Intravenous  Once 11/10/22 1632 11/11/22 1930     .  Patient was given sequential compression devices, early ambulation, and aspirin for DVT prophylaxis.  Recent vital signs: Patient Vitals for the past 24 hrs:  BP Temp Temp src Pulse Resp SpO2 Weight  11/20/22 0600 (!) 100/41 -- -- -- -- -- --  11/20/22 0350 103/79 97.7 F (36.5 C) Oral 67 16 99 % --  11/20/22 0300 (!) 96/39 -- -- -- -- -- --  11/20/22 0259 (!) 90/48 -- -- -- -- -- --  11/20/22 0250 (!) 86/38 -- -- -- -- -- --  11/20/22 0150 (!) 91/45 -- -- -- -- -- --  11/20/22 0045 (!) 82/48 -- -- -- -- -- --  11/20/22 0040 (!) 80/33 -- -- -- -- -- --  11/20/22 0035 (!) 84/36 -- -- -- -- -- --  11/19/22 2044 (!) 97/35 (!) 97.5 F (36.4 C) Oral 70 14 98 % --  11/19/22 1815 (!) 106/94 -- -- -- -- -- --  11/19/22 1715 (!) 90/51 -- -- 70 14 -- --  11/19/22 1600 111/73 -- -- -- -- -- --  11/19/22 1545 (!) 103/41 -- -- 68 15 -- --  11/19/22 1541 -- 98.7 F (37.1 C) Oral -- -- -- --  11/19/22 1532 121/64 -- -- -- -- -- --  11/19/22 1530 121/64 -- -- 71 -- -- --  11/19/22 1517 (!) 102/40 -- -- 70 17 -- --  11/19/22 1515  (!) 102/40 -- -- 68 --  100 % --  11/19/22 1502 124/60 -- -- -- -- -- --  11/19/22 1500 (!) 139/58 -- -- -- -- -- --  11/19/22 1447 (!) 126/94 -- -- -- -- -- --  11/19/22 1433 (!) 107/55 -- -- -- -- -- --  11/19/22 1427 (!) 124/96 -- -- 74 -- -- --  11/19/22 1419 (!) 121/53 -- -- 69 -- 100 % --  11/19/22 1415 (!) 84/42 -- -- -- -- -- --  11/19/22 1406 (!) 86/74 -- -- 71 -- 100 % --  11/19/22 1401 (!) 94/53 -- -- 70 -- -- --  11/19/22 1357 (!) 89/53 -- -- 68 -- -- --  11/19/22 1340 (!) 81/40 -- -- -- -- -- --  11/19/22 1315 (!) 72/48 -- -- 66 -- 99 % --  11/19/22 1054 (!) 95/45 98.1 F (36.7 C) Oral 65 17 100 % 97.9 kg  11/19/22 1030 (!) 112/47 -- -- 68 18 100 % --  11/19/22 1000 (!) 130/40 -- -- 74 20 100 % --  11/19/22 0930 (!) 131/56 -- -- 74 13 100 % --  11/19/22 0900 (!) 129/50 -- -- 68 19 100 % --  11/19/22 0830 (!) 120/48 -- -- 76 14 93 % --  11/19/22 0800 (!) 116/53 -- -- 72 15 94 % --  11/19/22 0746 (!) 110/51 -- -- 69 13 100 % --  .  Recent laboratory studies: No results found.  Discharge Medications:   Allergies as of 11/20/2022       Reactions   Cephalexin Diarrhea, Other (See Comments)   Codeine Nausea And Vomiting, Other (See Comments)        Medication List     STOP taking these medications    oxyCODONE-acetaminophen 5-325 MG tablet Commonly known as: PERCOCET/ROXICET   senna-docusate 8.6-50 MG tablet Commonly known as: Senokot-S   vortioxetine HBr 20 MG Tabs tablet Commonly known as: Trintellix       TAKE these medications    albuterol 108 (90 Base) MCG/ACT inhaler Commonly known as: VENTOLIN HFA Inhale 1-2 puffs into the lungs every 6 (six) hours as needed for wheezing or shortness of breath.   amiodarone 200 MG tablet Commonly known as: PACERONE Take 200 mg by mouth daily.   Anusol-HC 2.5 % rectal cream Generic drug: hydrocortisone Place 1 Application rectally daily as needed for hemorrhoids. (1000 & 2100)   apixaban 5 MG Tabs  tablet Commonly known as: ELIQUIS Take 1 tablet (5 mg total) by mouth 2 (two) times daily.   atorvastatin 80 MG tablet Commonly known as: LIPITOR Take 1 tablet (80 mg total) by mouth daily.   camphor-menthol lotion Commonly known as: SARNA Apply 1 Application topically every 8 (eight) hours as needed for itching.   ceFAZolin  IVPB Commonly known as: ANCEF Inject 2 g into the vein Every Tuesday,Thursday,and Saturday with dialysis for 10 days. Indication:  Wound infection Last Day of Therapy:  11/28/22 Method of administration: Per HD protocol Method of administration may be changed at the discretion of home infusion pharmacist based upon assessment of the patient and/or caregiver's ability to self-administer the medication ordered.   diphenoxylate-atropine 2.5-0.025 MG tablet Commonly known as: Lomotil Take 1 tablet by mouth 4 (four) times daily as needed for diarrhea or loose stools.   docusate sodium 283 MG enema Commonly known as: ENEMEEZ Place 1 enema (283 mg total) rectally as needed for severe constipation.   feeding supplement (NEPRO CARB STEADY) Liqd Take 237 mLs by mouth daily. (0900)  ferrous sulfate 325 (65 FE) MG tablet Take 325 mg by mouth 2 (two) times daily with a meal. (1000 & 2100)   FreeStyle Libre 2 Sensor Misc   Lantus SoloStar 100 UNIT/ML Solostar Pen Generic drug: insulin glargine Inject 16 Units into the skin 2 (two) times daily. What changed:  how much to take when to take this   levothyroxine 75 MCG tablet Commonly known as: SYNTHROID Take 75 mcg by mouth daily before breakfast.   linagliptin 5 MG Tabs tablet Commonly known as: TRADJENTA Take 1 tablet (5 mg total) by mouth daily.   LORazepam 0.5 MG tablet Commonly known as: ATIVAN Take 1 tablet (0.5 mg total) by mouth at bedtime.   metoCLOPramide 5 MG tablet Commonly known as: REGLAN Take 1 tablet (5 mg total) by mouth every 6 (six) hours as needed for refractory nausea / vomiting. What  changed:  medication strength how much to take reasons to take this   midodrine 10 MG tablet Commonly known as: PROAMATINE Take 10 mg by mouth daily.   NovoLOG FlexPen 100 UNIT/ML FlexPen Generic drug: insulin aspart Inject 0-15 Units into the skin See admin instructions. Inject 12 units subcutaneously 3 times daily with meals (0900, 1300 & 1700) & as needed via sliding scale insulin CBG <70=Notify NP/MD 71-149=0 units,150-199=4 units, 200-249=6 units,250- 299=10 units, 300-349=12 units, 350-399=15 units, > 400 call MD ( prime pen with 2 units prior  to set dose) What changed: how much to take   nystatin 100000 UNIT/ML suspension Commonly known as: MYCOSTATIN Use as directed 5 mLs (500,000 Units total) in the mouth or throat 4 (four) times daily.   ondansetron 4 MG tablet Commonly known as: ZOFRAN Take 4 mg by mouth every 8 (eight) hours as needed for nausea.   OneTouch Delica Plus Lancet30G Misc Use as directed to check blood sugars as need up to 4 times daily   OneTouch Verio Flex System w/Device Kit Use up to four times daily as directed.   oxyCODONE 5 MG immediate release tablet Commonly known as: Oxy IR/ROXICODONE Take 1 tablet (5 mg total) by mouth every 4 (four) hours as needed for moderate pain (pain score 4-6).   OXYGEN Inhale 2.5 L/min into the lungs at bedtime.   pantoprazole 40 MG tablet Commonly known as: Protonix Take 1 tablet (40 mg total) by mouth 2 (two) times daily before a meal.   Pentips 32G X 4 MM Misc Generic drug: Insulin Pen Needle Use as directed   polyethylene glycol 17 g packet Commonly known as: MIRALAX / GLYCOLAX Take 17 g by mouth 2 (two) times daily. What changed:  when to take this reasons to take this   pregabalin 150 MG capsule Commonly known as: LYRICA Take 150 mg by mouth 2 (two) times daily. What changed: Another medication with the same name was removed. Continue taking this medication, and follow the directions you see  here.   ramelteon 8 MG tablet Commonly known as: ROZEREM Take 1 tablet (8 mg total) by mouth at bedtime.   sevelamer carbonate 800 MG tablet Commonly known as: RENVELA Take 1,600 mg by mouth 3 (three) times daily with meals. (0800, 1200 & 1700)   venlafaxine XR 75 MG 24 hr capsule Commonly known as: EFFEXOR-XR Take 1 capsule (75 mg total) by mouth daily with breakfast.   zinc sulfate 220 (50 Zn) MG capsule Take 1 capsule (220 mg total) by mouth daily.  Home Infusion Instuctions  (From admission, onward)           Start     Ordered   11/17/22 0000  Home infusion instructions       Question:  Instructions  Answer:  Flushing of vascular access device: 0.9% NaCl pre/post medication administration and prn patency; Heparin 100 u/ml, 5ml for implanted ports and Heparin 10u/ml, 5ml for all other central venous catheters.   11/17/22 1150            Diagnostic Studies: DG CHEST PORT 1 VIEW  Result Date: 11/17/2022 CLINICAL DATA:  Shortness of breath EXAM: PORTABLE CHEST 1 VIEW COMPARISON:  Radiograph 09/01/2022 FINDINGS: Right IJ CVC tip in the low SVC. Stable cardiomegaly. Coronary stenting. Aortic atherosclerotic calcification. No focal consolidation, pleural effusion, or pneumothorax. No displaced rib fractures. IMPRESSION: No active disease.  Cardiomegaly. Electronically Signed   By: Minerva Fester M.D.   On: 11/17/2022 20:38   PERIPHERAL VASCULAR CATHETERIZATION  Result Date: 11/07/2022 DATE OF SERVICE: 11/07/2022  PATIENT:  Susan [name]  72 y.o. female  PRE-OPERATIVE DIAGNOSIS:  ESRD, malfunctioning fistula  POST-OPERATIVE DIAGNOSIS:  ESRD; thrombosed fistula  PROCEDURE:  1) left arm fistulagram 2) left arm angiogram  SURGEON:  Surgeon(s) and Role:    * Leonie Douglas, MD - Primary  ASSISTANT: none  ANESTHESIA:   local  EBL: minimal  BLOOD ADMINISTERED:none  DRAINS: none  LOCAL MEDICATIONS USED:  LIDOCAINE  SPECIMEN:  none  COUNTS: confirmed correct.   TOURNIQUET:  none  PATIENT DISPOSITION:  PACU - hemodynamically stable.  Delay start of Pharmacological VTE agent (>24hrs) due to surgical blood loss or risk of bleeding: no  INDICATION FOR PROCEDURE: Susan [name] is a 72 y.o. female with ESRD; non-functional left arm fistula. After careful discussion of risks, benefits, and alternatives the patient was offered fistulagram. The patient understood and wished to proceed.  OPERATIVE FINDINGS: Thrombosed fistula. Not salvageable. Left upper extremity angiogram normal.  DESCRIPTION OF PROCEDURE: After identification of the patient in the pre-operative holding area, the patient was transferred to the operating room. The patient was positioned supine on the operating room table. Anesthesia was induced. The left arm was prepped and draped in standard fashion. A surgical pause was performed confirming correct patient, procedure, and operative location.  The open portion of the fistula was cannulated with micropuncture technique.  Fistulogram was performed.  This showed a completely thrombosed fistula and a normal arterial tree.  No options for salvage of the fistula were available.  All access was removed and manual pressure held.  A bandage was applied.  Upon completion of the case instrument and sharps counts were confirmed correct. The patient was transferred to the PACU in good condition. I was present for all portions of the procedure.  FOLLOW UP PLAN: Patient will need to follow-up with one of the previous surgeons in the next 4 weeks with vein mapping to discuss during dialysis access.  Rande Brunt. Lenell Antu, MD [facility] Phone Number: [phone] 11/07/2022 11:33 AM    Patient benefited maximally from their hospital stay and there were no complications.     Disposition: Discharge disposition: 02-Transferred to [facility]      Discharge Instructions     Call MD / Call 911   Complete by: As directed     If you experience chest pain or shortness of breath, CALL 911 and be transported to the hospital emergency room.  If  you develope a fever above 101 F, pus (white drainage) or increased drainage or redness at the wound, or calf pain, call your surgeon's office.   Call MD / Call 911   Complete by: As directed    If you experience chest pain or shortness of breath, CALL 911 and be transported to the hospital emergency room.  If you develope a fever above 101 F, pus (white drainage) or increased drainage or redness at the wound, or calf pain, call your surgeon's office.   Constipation Prevention   Complete by: As directed    Drink plenty of fluids.  Prune juice may be helpful.  You may use a stool softener, such as Colace (over the counter) 100 mg twice a day.  Use MiraLax (over the counter) for constipation as needed.   Constipation Prevention   Complete by: As directed    Drink plenty of fluids.  Prune juice may be helpful.  You may use a stool softener, such as Colace (over the counter) 100 mg twice a day.  Use MiraLax (over the counter) for constipation as needed.   Diet - low sodium heart healthy   Complete by: As directed    Diet - low sodium heart healthy   Complete by: As directed    Home infusion instructions   Complete by: As directed    Instructions: Flushing of vascular access device: 0.9% NaCl pre/post medication administration and prn patency; Heparin 100 u/ml, 5ml for implanted ports and Heparin 10u/ml, 5ml for all other central venous catheters.   Increase activity slowly as tolerated   Complete by: As directed    Increase activity slowly as tolerated   Complete by: As directed    Negative Pressure Wound Therapy - Incisional   Complete by: As directed    Attached the wound VAC dressing to a Praveena plus portable wound VAC pump for discharge.   Post-operative opioid taper instructions:   Complete by: As directed    POST-OPERATIVE OPIOID TAPER INSTRUCTIONS: It is important to  wean off of your opioid medication as soon as possible. If you do not need pain medication after your surgery it is ok to stop day one. Opioids include: Codeine, Hydrocodone(Norco, Vicodin), Oxycodone(Percocet, oxycontin) and hydromorphone amongst others.  Long term and even short term use of opiods can cause: Increased pain response Dependence Constipation Depression Respiratory depression And more.  Withdrawal symptoms can include Flu like symptoms Nausea, vomiting And more Techniques to manage these symptoms Hydrate well Eat regular healthy meals Stay active Use relaxation techniques(deep breathing, meditating, yoga) Do Not substitute Alcohol to help with tapering If you have been on opioids for less than two weeks and do not have pain than it is ok to stop all together.  Plan to wean off of opioids This plan should start within one week post op of your joint replacement. Maintain the same interval or time between taking each dose and first decrease the dose.  Cut the total daily intake of opioids by one tablet each day Next start to increase the time between doses. The last dose that should be eliminated is the evening dose.      Post-operative opioid taper instructions:   Complete by: As directed    POST-OPERATIVE OPIOID TAPER INSTRUCTIONS: It is important to wean off of your opioid medication as soon as possible. If you do not need pain medication after your surgery it is ok to stop day one. Opioids include: Codeine, Hydrocodone(Norco, Vicodin), Oxycodone(Percocet, oxycontin) and hydromorphone  amongst others.  Long term and even short term use of opiods can cause: Increased pain response Dependence Constipation Depression Respiratory depression And more.  Withdrawal symptoms can include Flu like symptoms Nausea, vomiting And more Techniques to manage these symptoms Hydrate well Eat regular healthy meals Stay active Use relaxation techniques(deep breathing,  meditating, yoga) Do Not substitute Alcohol to help with tapering If you have been on opioids for less than two weeks and do not have pain than it is ok to stop all together.  Plan to wean off of opioids This plan should start within one week post op of your joint replacement. Maintain the same interval or time between taking each dose and first decrease the dose.  Cut the total daily intake of opioids by one tablet each day Next start to increase the time between doses. The last dose that should be eliminated is the evening dose.          Follow-up Information     [facility] Follow up.   Specialty: Home Health Services Contact information: [address] [place] [address] [phone]         Nadara Mustard, MD Follow up in 1 week(s).   Specialty: Orthopedic Surgery Contact information: [address] [place] [address] [phone]                  Signed: Nadara Mustard 11/20/2022, 7:38 AM

## 2022-11-20 NOTE — Progress Notes (Addendum)
[facility] Progress Note   Subjective:  Had extra HD yesterday with 3L UF. Post HD--rapid response called for profound hypotension with AMS/jerking movements. Received fluid bolus/albumin. Prev on high dose Lyrica --discontinued.   Seen in dialysis unit this am. Slight twitching movements but better than yesterday. UF as BP allows.   Objective Vitals:   11/20/22 0350 11/20/22 0600 11/20/22 0738 11/20/22 0843  BP: 103/79 (!) 100/41 90/76 (!) 124/40  Pulse: 67  63 (!) 57  Resp: 16  18 (!) 9  Temp: 97.7 F (36.5 C)  97.6 F (36.4 C) 97.9 F (36.6 C)  TempSrc: Oral   Oral  SpO2: 99%   99%  Weight:    99.2 kg  Height:       Physical Exam General: chronically ill appearing, on nasal oxygen Heart: RRR, no mrg Lungs: Clear bilaterally  Abdomen:soft, NTND Extremities: L leg bandaged, 1+ edema in hips, R BKA Dialysis Access: R TDC, L AVF no bruit   Filed Weights   11/19/22 0737 11/19/22 1054 11/20/22 0843  Weight: 100.5 kg 97.9 kg 99.2 kg    Intake/Output Summary (Last 24 hours) at 11/20/2022 0853 Last data filed at 11/19/2022 1054 Gross per 24 hour  Intake --  Output 3000 ml  Net -3000 ml     Additional Objective Labs: Basic Metabolic Panel: Recent Labs  Lab 11/17/22 0739 11/18/22 0151 11/19/22 0205 11/20/22 0116  NA 133* 134* 131* 132*  K 3.9 4.2 3.9 4.2  CL 98 97* 95* 95*  CO2 21* 23 25 26   GLUCOSE 80 85 207* 330*  BUN 60* 70* 42* 35*  CREATININE 4.84* 5.43* 3.97* 3.63*  CALCIUM 9.0 8.7* 8.4* 8.9  PHOS 6.4* 6.5* 4.6  --     Liver Function Tests: Recent Labs  Lab 11/17/22 0739 11/18/22 0151 11/19/22 0205  ALBUMIN 2.8* 2.6* 2.5*     CBC: Recent Labs  Lab 11/14/22 0702 11/15/22 0415 11/16/22 0152 11/17/22 0739 11/18/22 0151 11/19/22 0205 11/20/22 0116  WBC 5.8 4.8 5.3 5.5 5.7 4.8 5.9  NEUTROABS 3.8 3.9  --  3.7  --   --   --   HGB 9.0* 8.3* 8.5* 9.1* 8.7* 8.4* 8.5*  HCT 29.3* 26.9* 27.0* 30.3* 29.2* 28.0* 28.2*  MCV 80.5  77.3* 78.3* 81.5 79.3* 80.7 81.5  PLT 151 157 159 171 167 174 187    CBG: Recent Labs  Lab 11/19/22 1244 11/19/22 1604 11/19/22 2038 11/20/22 0444 11/20/22 0742  GLUCAP 149* 108* 243* 252* 248*     Medications:  sodium chloride Stopped (11/19/22 2251)    (feeding supplement) PROSource Plus  30 mL Oral BID BM   acetaminophen  1,000 mg Oral TID   amiodarone  200 mg Oral Daily   apixaban  5 mg Oral BID   ascorbic acid  500 mg Oral Daily   atorvastatin  80 mg Oral Daily   Chlorhexidine Gluconate Cloth  6 each Topical Q0600   clotrimazole  1 Applicatorful Vaginal QHS   feeding supplement (NEPRO CARB STEADY)  237 mL Oral BID BM   folic acid  1 mg Oral Daily   insulin aspart  0-5 Units Subcutaneous QHS   insulin aspart  0-6 Units Subcutaneous TID WC   insulin glargine-yfgn  20 Units Subcutaneous QHS   insulin glargine-yfgn  40 Units Subcutaneous Daily   levothyroxine  75 mcg Oral QAC breakfast   midodrine  10 mg Oral Q T,Th,Sa-HD   multivitamin  1 tablet Oral QHS  pantoprazole  40 mg Oral BID AC   polyethylene glycol  17 g Oral Daily   ramelteon  8 mg Oral QHS   senna-docusate  2 tablet Oral BID   sevelamer carbonate  1,600 mg Oral TID WC   venlafaxine XR  75 mg Oral Q breakfast   zinc sulfate  220 mg Oral Daily    Dialysis Orders: [facility] TTS 4:00 400/800 EDW 91.5kg 2K/2Ca TDC Heparin 4000 -Mircera 75  - last 6/8 -Calcitriol 0.5 three times per week     Assessment/Plan: Infected left lower extremity wound - maggot infestation on admission. Blood cultures neg. Ortho following - s/p OR debridement/wound vac on 6/21. Wound cultures+ proteus/klebsiella --Continue cefazolin 2g qHD until 11/28/22 ESRD -  HD TTS. Extra HD Wed for volume. 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 For access surgery next month  Hypotension/volume  - BP soft during admission. On midodrine pre HD. Albumin prn. Does have some volume on exam. UF as able.  Anemia  - Hgb 8.5. Received  Aranesp 150 on 6/23.  Metabolic bone disease -  Ca/Phos acceptable. Continue sevelamer binders with diet.  Nutrition - Renal diet with fluid restriction. + Prot supp.  AFib - on amiodarone/Eliquis  T2DM - historically poorly controlled. Insulin per primary AMS/Twitching - felt secondary to Lyrica toxicity in ESRD pt. Discontinued - continue to hold. Dispo - For CIR admit   Waller Marcussen Susann Givens PA-C [facility] 11/20/2022,8:53 AM

## 2022-11-20 NOTE — Progress Notes (Signed)
PROGRESS NOTE    Susan [name]  AVW:[id number] DOB: August 31, 1950 DOA: 11/10/2022 PCP: Donita Brooks, MD    No chief complaint on file.   Brief Narrative:  72yo female with h/o CAD, chronic diastolic CHF, HLD, TTS HD, HTN, PVD s/p R BKA, hypothyroidism, morbid obesity, afib, and DM presented with maggot infestation of her LLE. Dr. Lajoyce Corners sent her to the ER for plans of surgical debridement..  Assessment & Plan:   Principal Problem:   Complicated wound infection Active Problems:   Type 2 diabetes mellitus with diabetic polyneuropathy, with long-term current use of insulin (HCC)   ESRD on dialysis (HCC)   Paroxysmal atrial fibrillation (HCC)   Hyperlipemia   Hypotension   Impaired ambulation   Hypothyroidism   Prolonged QT interval   Obesity, Class III, BMI 40-49.9 (morbid obesity) (HCC)   S/P BKA (below knee amputation) unilateral, right (HCC)   Maggot infestation   Cellulitis of left lower extremity  Left lower extremity wound infection/necrotic wound lateral left leg with maggots Had failed outpatient treatment.  Was seen in consultation with orthopedics. Status post excisional debridement of lower extremity with application of wound VAC, 11/14/2022, cultures sent and growing Proteus mirabilis and Klebsiella pneumoniae.   ID  narrowed to IV cefazolin with end date 11/29/2022.    Tremors with hypotension status post dialysis yesterday.  Appears to be more stable today.  Blood pressure better.  Mentating well.  Less tremulous.  Plan is to decrease the dose of Lyrica on discharge.  Have discontinued IV Dilaudid.  Decreasing oxycodone dose.  Continue midodrine.  ESRD on HD TTS Patient noted with thrombosed aVF felt not salvageable per nephrology and patient for planned outpatient follow-up in 3 to 4 weeks.  Continue hemodialysis as per nephrology.  Diabetes mellitus type 2 Hemoglobin A1c 7.9 (08/27/2022). Continue sliding scale insulin and long-acting insulin.   Atrial  fibrillation -Continue amiodarone,Eliquis.  Rate controlled    History of right renal infarct, splenic infarct -In the setting of underlying A-fib Eliquis has been resumed  Hypothyroidism Continue Synthroid.  PVD status post right BKA -Continue statin and Eliquis.  Morbid obesity -BMI 41.77.  Would benefit from weight loss as outpatient.  Ambulatory dysfunction -Patient noted unable to bear weight to assist with transfers since BKA. Being fitted for prosthesis. Plan for CIR at this time.   Prolonged QTc -Repeat EKG with resolution of QTc prolongation.   MDD/GAD with panic disorder. Followed by psychiatry during hospitalization. Was on vortioxetine and was transitioned to venlafaxine per psychiatry. Patient feels new antidepressant medication working much better than prior one. -Psychiatry  signed off as of 11/14/2022.   Constipation Continue bowel regimen on discharge.      Shortness of breath Chest x-ray without acute abnormality.  On hemodialysis for volume management.  On supplemental oxygen.  Stage II pressure injury of the sacrum, POA Pressure Injury 08/27/22 Coccyx Mid Stage 2 -  Partial thickness loss of dermis presenting as a shallow open injury with a red, pink wound bed without slough. 37mmx5mmx0 red center unattached edges (Active)  08/27/22 1000  Location: Coccyx  Location Orientation: Mid  Staging: Stage 2 -  Partial thickness loss of dermis presenting as a shallow open injury with a red, pink wound bed without slough.  Wound Description (Comments): 35mmx5mmx0 red center unattached edges  Present on Admission:     Continue wound care on discharge.    DVT prophylaxis: Eliquis   Code Status: Full code  Family Communication: None at  bedside.  Disposition: CIR when bed available.  Patient is medically stable for disposition.  Status is: Inpatient Remains inpatient appropriate because: Severity of illness   Consultants:  WOC RN: Ria Bush, RN  11/10/2022 Nephrology: Dr. Signe Colt 11/11/2022 Psychiatry: Dr. Gasper Sells 11/11/2022 Orthopedics: Dr. Lajoyce Corners 11/12/2022 ID: Dr. Thedore Mins 11/17/2022  Procedures:  Excisional debridement of lower extremity wound with excision skin and soft tissue muscle and fascia/application of wound VAC/application of Kerecis micro graft 95 cm per orthopedics: Dr. Lajoyce Corners 11/14/2022  Antimicrobials:  Cefazolin until 11/29/2022.   Subjective: Today, patient was seen and examined during hemodialysis.  Patient states that she feels much better today.  Mild pain but controlled.  No tremors or jerking yesterday.    Objective: Vitals:   11/20/22 0930 11/20/22 1000 11/20/22 1030 11/20/22 1100  BP: (!) 116/43 (!) 114/41 (!) 112/44 (!) 131/56  Pulse: (!) 51 (!) 51 (!) 51 (!) 54  Resp: 10 11 18 17   Temp:      TempSrc:      SpO2: 96% 96% 97% 97%  Weight:      Height:       No intake or output data in the 24 hours ending 11/20/22 1123  Filed Weights   11/19/22 0737 11/19/22 1054 11/20/22 0843  Weight: 100.5 kg 97.9 kg 99.2 kg    Physical examination:  General exam: Alert awake and Communicative, Respiratory system: Decreased breath sounds bilaterally.   Cardiovascular system: RRR no murmur. Gastrointestinal system: Abdomen is soft, nontender, nondistended Central nervous system: Alert and oriented. No focal neurological deficits. Extremities: Left lower extremity bandaged  Status post right BKA. Skin: Left lower extremity wound with dressing. Psychiatry:  mildly anxious.    Data Reviewed: I have personally reviewed following labs and imaging studies  CBC: Recent Labs  Lab 11/14/22 0702 11/15/22 0415 11/16/22 0152 11/17/22 0739 11/18/22 0151 11/19/22 0205 11/20/22 0116  WBC 5.8 4.8 5.3 5.5 5.7 4.8 5.9  NEUTROABS 3.8 3.9  --  3.7  --   --   --   HGB 9.0* 8.3* 8.5* 9.1* 8.7* 8.4* 8.5*  HCT 29.3* 26.9* 27.0* 30.3* 29.2* 28.0* 28.2*  MCV 80.5 77.3* 78.3* 81.5 79.3* 80.7 81.5  PLT 151 157 159 171 167 174  187     Basic Metabolic Panel: Recent Labs  Lab 11/15/22 0415 11/16/22 0152 11/17/22 0739 11/18/22 0151 11/19/22 0205 11/20/22 0116  NA 131* 132* 133* 134* 131* 132*  K 3.5 3.5 3.9 4.2 3.9 4.2  CL 94* 96* 98 97* 95* 95*  CO2 23 26 21* 23 25 26   GLUCOSE 461* 325* 80 85 207* 330*  BUN 61* 42* 60* 70* 42* 35*  CREATININE 4.33* 3.30* 4.84* 5.43* 3.97* 3.63*  CALCIUM 8.5* 8.6* 9.0 8.7* 8.4* 8.9  MG  --   --   --   --   --  1.9  PHOS 5.9* 4.1 6.4* 6.5* 4.6  --      GFR: Estimated Creatinine Clearance: 16.1 mL/min (A) (by C-G formula based on SCr of 3.63 mg/dL (H)).  Liver Function Tests: Recent Labs  Lab 11/15/22 0415 11/16/22 0152 11/17/22 0739 11/18/22 0151 11/19/22 0205  ALBUMIN 2.5* 2.6* 2.8* 2.6* 2.5*     CBG: Recent Labs  Lab 11/19/22 1244 11/19/22 1604 11/19/22 2038 11/20/22 0444 11/20/22 0742  GLUCAP 149* 108* 243* 252* 248*      Recent Results (from the past 240 hour(s))  Blood Cultures x 2 sites     Status: None   Collection Time:  11/10/22 12:58 PM   Specimen: BLOOD RIGHT ARM  Result Value Ref Range Status   Specimen Description BLOOD RIGHT ARM  Final   Special Requests   Final    BOTTLES DRAWN AEROBIC AND ANAEROBIC Blood Culture results may not be optimal due to an inadequate volume of blood received in culture bottles   Culture   Final    NO GROWTH 5 DAYS Performed at [facility], [address], [place], [place] [address]    Report Status 11/15/2022 FINAL  Final  Blood Cultures x 2 sites     Status: None   Collection Time: 11/10/22  4:41 PM   Specimen: BLOOD RIGHT FOREARM  Result Value Ref Range Status   Specimen Description BLOOD RIGHT FOREARM  Final   Special Requests   Final    BOTTLES DRAWN AEROBIC AND ANAEROBIC Blood Culture adequate volume   Culture   Final    NO GROWTH 5 DAYS Performed at [facility], [address], [place], [place] [address]    Report Status 11/15/2022 FINAL  Final  Surgical pcr screen      Status: Abnormal   Collection Time: 11/11/22 11:31 PM   Specimen: Nasal Mucosa; Nasal Swab  Result Value Ref Range Status   MRSA, PCR NEGATIVE NEGATIVE Final   Staphylococcus aureus POSITIVE (A) NEGATIVE Final    Comment: (NOTE) The Xpert SA Assay (FDA approved for NASAL specimens in patients 62 years of age and older), is one component of a comprehensive surveillance program. It is not intended to diagnose infection nor to guide or monitor treatment. Performed at [facility], [address], [place], [place] [address]   Aerobic/Anaerobic Culture w Gram Stain (surgical/deep wound)     Status: None   Collection Time: 11/14/22  9:19 AM   Specimen: Soft Tissue, Other  Result Value Ref Range Status   Specimen Description TISSUE  Final   Special Requests NONE  Final   Gram Stain   Final    MODERATE WBC PRESENT,BOTH PMN AND MONONUCLEAR RARE SQUAMOUS EPITHELIAL CELLS PRESENT NO ORGANISMS SEEN Performed at [facility], [address] [place], [place] [address]    Culture   Final    FEW PROTEUS MIRABILIS RARE KLEBSIELLA PNEUMONIAE NO ANAEROBES ISOLATED FEW ENTEROCOCCUS FAECALIS    Report Status 11/19/2022 FINAL  Final   Organism ID, Bacteria PROTEUS MIRABILIS  Final   Organism ID, Bacteria KLEBSIELLA PNEUMONIAE  Final   Organism ID, Bacteria ENTEROCOCCUS FAECALIS  Final      Susceptibility   Enterococcus faecalis - MIC*    AMPICILLIN <=2 SENSITIVE Sensitive     VANCOMYCIN 1 SENSITIVE Sensitive     GENTAMICIN SYNERGY SENSITIVE Sensitive     * FEW ENTEROCOCCUS FAECALIS   Klebsiella pneumoniae - MIC*    AMPICILLIN >=32 RESISTANT Resistant     CEFEPIME <=0.12 SENSITIVE Sensitive     CEFTAZIDIME <=1 SENSITIVE Sensitive     CEFTRIAXONE <=0.25 SENSITIVE Sensitive     CIPROFLOXACIN <=0.25 SENSITIVE Sensitive     GENTAMICIN <=1 SENSITIVE Sensitive     IMIPENEM <=0.25 SENSITIVE Sensitive     TRIMETH/SULFA <=20 SENSITIVE Sensitive     AMPICILLIN/SULBACTAM 8 SENSITIVE  Sensitive     PIP/TAZO 8 SENSITIVE Sensitive     * RARE KLEBSIELLA PNEUMONIAE   Proteus mirabilis - MIC*    AMPICILLIN <=2 SENSITIVE Sensitive     CEFEPIME <=0.12 SENSITIVE Sensitive     CEFTAZIDIME <=1 SENSITIVE Sensitive  CEFTRIAXONE <=0.25 SENSITIVE Sensitive     CIPROFLOXACIN <=0.25 SENSITIVE Sensitive     GENTAMICIN <=1 SENSITIVE Sensitive     IMIPENEM 2 SENSITIVE Sensitive     TRIMETH/SULFA <=20 SENSITIVE Sensitive     AMPICILLIN/SULBACTAM <=2 SENSITIVE Sensitive     PIP/TAZO <=4 SENSITIVE Sensitive     * FEW PROTEUS MIRABILIS      Radiology Studies: No results found.      Scheduled Meds:  (feeding supplement) PROSource Plus  30 mL Oral BID BM   acetaminophen  1,000 mg Oral TID   amiodarone  200 mg Oral Daily   apixaban  5 mg Oral BID   ascorbic acid  500 mg Oral Daily   atorvastatin  80 mg Oral Daily   Chlorhexidine Gluconate Cloth  6 each Topical Q0600   clotrimazole  1 Applicatorful Vaginal QHS   feeding supplement (NEPRO CARB STEADY)  237 mL Oral BID BM   folic acid  1 mg Oral Daily   heparin sodium (porcine)  3,800 Units Intracatheter Once   insulin aspart  0-5 Units Subcutaneous QHS   insulin aspart  0-6 Units Subcutaneous TID WC   insulin glargine-yfgn  20 Units Subcutaneous QHS   insulin glargine-yfgn  40 Units Subcutaneous Daily   levothyroxine  75 mcg Oral QAC breakfast   midodrine  10 mg Oral Q T,Th,Sa-HD   multivitamin  1 tablet Oral QHS   pantoprazole  40 mg Oral BID AC   polyethylene glycol  17 g Oral Daily   ramelteon  8 mg Oral QHS   senna-docusate  2 tablet Oral BID   sevelamer carbonate  1,600 mg Oral TID WC   venlafaxine XR  75 mg Oral Q breakfast   zinc sulfate  220 mg Oral Daily   Continuous Infusions:  sodium chloride Stopped (11/19/22 2251)     LOS: 10 days    Joycelyn Das, MD [facility]  11/20/2022, 11:23 AM

## 2022-11-20 NOTE — Progress Notes (Signed)
Inpatient Rehabilitation Admissions Coordinator   I met with patient at dialysis and BP is stable. She looks better than yesterday. I will contact Dr Tyson Babinski to request d/c to CIR today  Acute team and TOC made aware.  Ottie Glazier, RN, MSN Rehab Admissions Coordinator [phone] 11/20/2022 12:19 PM

## 2022-11-20 NOTE — Progress Notes (Signed)
Susan Sheriff, DO  Physician Physical Medicine and Rehabilitation   PMR Pre-admission     Signed   Date of Service: 11/19/2022  6:03 PM  Related encounter: ED to Hosp-Admission (Current) from 11/10/2022 in [facility]   Signed      Show:Clear all [x] Written[] Templated[x] Copied  Added by: [x] Standley Brooking, RN  [] Hover for details     Susan Dance, MD  Physician Physical Medicine and Rehabilitation   PMR Pre-admission     Signed   Date of Service: 11/19/2022  8:32 AM    Signed       Show:Clear all [x] Written[x] Templated[x] Copied   Added by: [x] Standley Brooking, RN[x] Susan Dance, MD   [] Hover for details PMR Admission Coordinator Pre-Admission Assessment   Patient: Susan [name] is an 72 y.o., female MRN: [id number] DOB: May 15, 1951 Height: 5\' 5"  (165.1 cm) Weight: 100.5 kg (bed)   Insurance Information HMO: yes    PPO:      PCP:      IPA:      80/20:      OTHER:  PRIMARY: [employer]      Policy#: [id number]      Subscriber: pt CM Name: Junious Dresser      Phone#: [phone]     Fax#: [phone] [facility] access Pre-Cert#: [id number] approved for 7 days      Employer:  Benefits:  Phone #: [phone]     Name: 6/25 Eff. Date: 05/26/22     Deduct: none      Out of Pocket Max: $3500      Life Max: none CIR: $225 co pay per day days 1 until 6      SNF: no co pay per day days 1 until 20; $203 co pay per day days 21 until 100 Outpatient: $15 per visit     Co-Pay: visits per medical neccesity Home Health: 80%      Co-Pay: 20% DME: 80%     Co-Pay: 20% Providers: in network   Dr Logan Bores approved CIR admit to begin use of prosthetic leg, decrease burden from hoyer to transfers for outpatient hemodialysis, etc.  SECONDARY: none   Financial Counselor:       Phone#:    The Engineer, materials Information Summary" for patients in Inpatient Rehabilitation Facilities with attached "Privacy Act Statement-Health Care  Records" was provided and verbally reviewed with: Patient and Family   Emergency Contact Information Contact Information       Name Relation Home Work Mobile    [place] Spouse [phone]   [phone]    Leanna Battles Daughter [phone]   [phone]         Current Medical History  Patient Admitting Diagnosis: LLE complicated wound, Right BKA   History of Present Illness: 72 year old female with history of CAD, chronic diastolic CHF, HLD, ESRD on hemodialysis, PVD s/p Right BKA, hypothyroidism, afib and DM. Presented on 11/10/22  with maggot infestation of her LLE. 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 She went to SNF but has been unable to bear weight on her left leg. Discharged home 4/24. Also with a sacral ulcer. Has seen Dr Lajoyce Corners as an outpatient with compression wrapped placed to LLE. Over the weekend of 6/15, she and spouse noticed maggot infestation of the wound and notified clinic. She was admitted 6/17 for surgical debridement of wound.   She was started on Vanc/Zosyn and Flagyl. Wound debridement by Dr Lajoyce Corners  on 6/21 with Wound VAC applied. ESRD on hemodialysis managed by nephrology. On Semglee and SSI for DM management. Continue amiodarone for rate control. Eliquis resumed after debridement.Synthroid resumed as well as Midodrine. [facility] to be notified of admission for prosthetic fitting during CIR stay. Psychiatry consulted due to MDD/GAD with panic. On vortioxetine and to be transitioned to venlafaxine. WOC for sacral wound.    Admissions held on 6/26 due to soft BP and twitching to upper extremities. Lyrica discontinued and given IV bolus.Received extra hemodialysis today off schedule for volume removal. Hemodialysis regular schedule 6/27 and BP maintained without difficulty. Tremors has lessened today.   Patient's medical record from [facility] has been reviewed by the rehabilitation admission coordinator and physician.    Past Medical History         Past Medical History:  Diagnosis Date   Anemia      hx   Anxiety     Arthritis      "generalized" (03/15/2014)   CAD (coronary artery disease)      MI in 2000 - MI  2007 - treated bare metal stent (no nuclear since then as 9/11)   Carotid artery disease (HCC)     Chronic diastolic heart failure (HCC)      a) ECHO (08/2013) EF 55-60% and RV function nl b) RHC (08/2013) RA 4, RV 30/5/7, PA 25/10 (16), PCWP 7, Fick CO/CI 6.3/2.7, PVR 1.5 WU, PA 61 and 66%   Daily headache      "~ every other day; since I fell in June" (03/15/2014)   Depression     Dyslipidemia     ESRD (end stage renal disease) (HCC)      Dialysis on Tues Thurs Sat   Exertional shortness of breath     History of kidney stones     HTN (hypertension)     Hypothyroidism     Obesity     Osteoarthritis     PAF (paroxysmal atrial fibrillation) (HCC)     Peripheral neuropathy      bilateral feet/hands   PONV (postoperative nausea and vomiting)     RBBB (right bundle branch block)      Old   Stroke (HCC)      mini strokes   Type II diabetes mellitus (HCC)      Type II, [name]. patient has omnipod insulin pump with Novolin R Insulin    Has the patient had major surgery during 100 days prior to admission? Yes   Family History   family history includes Heart attack (age of onset: 42) in her mother.   Current Medications   Current Facility-Administered Medications:    (feeding supplement) PROSource Plus liquid 30 mL, 30 mL, Oral, BID BM, Nadara Mustard, MD, 30 mL at 11/18/22 1707   0.9 %  sodium chloride infusion, , Intravenous, Continuous, Nadara Mustard, MD   acetaminophen (TYLENOL) tablet 650 mg, 650 mg, Oral, Q6H PRN, 650 mg at 11/15/22 0319 **OR** acetaminophen (TYLENOL) suppository 650 mg, 650 mg, Rectal, Q6H PRN, Nadara Mustard, MD   acetaminophen (TYLENOL) tablet 1,000 mg, 1,000 mg, Oral, TID, Nadara Mustard, MD, 1,000 mg at 11/18/22 2138   acetaminophen  (TYLENOL) tablet 325-650 mg, 325-650 mg, Oral, Q6H PRN, Nadara Mustard, MD   albumin human 25 % solution 25 g, 25 g, Intravenous, PRN, Penninger, Lindsay, PA, Last Rate: 60 mL/hr at 11/15/22 0940, 25 g at 11/15/22 0940   alteplase (CATHFLO ACTIVASE)  injection 2 mg, 2 mg, Intra catheter, Once PRN, Ejigiri, Megan Mans, PA-C   alum & mag hydroxide-simeth (MAALOX/MYLANTA) 200-200-20 MG/5ML suspension 30 mL, 30 mL, Oral, Q6H PRN, Rodolph Bong, MD, 30 mL at 11/19/22 0514   amiodarone (PACERONE) tablet 200 mg, 200 mg, Oral, Daily, Nadara Mustard, MD, 200 mg at 11/17/22 1610   anticoagulant sodium citrate solution 5 mL, 5 mL, Intracatheter, PRN, Ejigiri, Ogechi Grace, PA-C   apixaban (ELIQUIS) tablet 5 mg, 5 mg, Oral, BID, Rodolph Bong, MD, 5 mg at 11/18/22 2138   ascorbic acid (VITAMIN C) tablet 500 mg, 500 mg, Oral, Daily, Nadara Mustard, MD, 500 mg at 11/17/22 0801   atorvastatin (LIPITOR) tablet 80 mg, 80 mg, Oral, Daily, Nadara Mustard, MD, 80 mg at 11/17/22 0801   bisacodyl (DULCOLAX) EC tablet 5 mg, 5 mg, Oral, Daily PRN, Nadara Mustard, MD   bisacodyl (DULCOLAX) suppository 10 mg, 10 mg, Rectal, Daily PRN, Nadara Mustard, MD   calcium carbonate (dosed in mg elemental calcium) suspension 500 mg of elemental calcium, 500 mg of elemental calcium, Oral, Q6H PRN, Nadara Mustard, MD, 500 mg of elemental calcium at 11/12/22 1929   camphor-menthol (SARNA) lotion 1 Application, 1 Application, Topical, Q8H PRN **AND** hydrOXYzine (ATARAX) tablet 25 mg, 25 mg, Oral, Q8H PRN, Nadara Mustard, MD, 25 mg at 11/19/22 9604   ceFAZolin (ANCEF) IVPB 2g/100 mL premix, 2 g, Intravenous, Q T,Th,Sat-1800, Singh, Mayanka, MD, Last Rate: 200 mL/hr at 11/18/22 1715, 2 g at 11/18/22 1715   Chlorhexidine Gluconate Cloth 2 % PADS 6 each, 6 each, Topical, Q0600, Nadara Mustard, MD, 6 each at 11/19/22 5409   clotrimazole (GYNE-LOTRIMIN 3) 2 % vaginal cream 1 Applicatorful, 1 Applicatorful, Vaginal, QHS, Nadara Mustard, MD, 1 Applicatorful at 11/16/22 2245   docusate sodium (ENEMEEZ) enema 283 mg, 1 enema, Rectal, PRN, Nadara Mustard, MD   feeding supplement (NEPRO CARB STEADY) liquid 237 mL, 237 mL, Oral, BID BM, Rodolph Bong, MD, 237 mL at 11/18/22 0815   folic acid (FOLVITE) tablet 1 mg, 1 mg, Oral, Daily, Nadara Mustard, MD, 1 mg at 11/17/22 0801   heparin injection 1,000 Units, 1,000 Units, Intracatheter, PRN, Ejigiri, Ogechi Grace, PA-C   heparin injection 1,900 Units, 20 Units/kg, Dialysis, PRN, Theodoro Clock, Ogechi Grace, PA-C   hydrALAZINE (APRESOLINE) injection 5 mg, 5 mg, Intravenous, Q4H PRN, Nadara Mustard, MD   HYDROmorphone (DILAUDID) injection 0.5-1 mg, 0.5-1 mg, Intravenous, Q4H PRN, Nadara Mustard, MD, 1 mg at 11/19/22 0636   insulin aspart (novoLOG) injection 0-5 Units, 0-5 Units, Subcutaneous, QHS, Nadara Mustard, MD, 2 Units at 11/18/22 2158   insulin aspart (novoLOG) injection 0-6 Units, 0-6 Units, Subcutaneous, TID WC, Nadara Mustard, MD, 2 Units at 11/18/22 1718   insulin glargine-yfgn (SEMGLEE) injection 20 Units, 20 Units, Subcutaneous, QHS, Rodolph Bong, MD, 20 Units at 11/18/22 2237   insulin glargine-yfgn (SEMGLEE) injection 40 Units, 40 Units, Subcutaneous, Daily, Rodolph Bong, MD, 40 Units at 11/18/22 0814   ipratropium-albuterol (DUONEB) 0.5-2.5 (3) MG/3ML nebulizer solution 3 mL, 3 mL, Nebulization, Q6H PRN, Rodolph Bong, MD   levothyroxine (SYNTHROID) tablet 75 mcg, 75 mcg, Oral, QAC breakfast, Nadara Mustard, MD, 75 mcg at 11/19/22 0513   lidocaine (PF) (XYLOCAINE) 1 % injection 5 mL, 5 mL, Intradermal, PRN, Ejigiri, Megan Mans, PA-C   lidocaine-prilocaine (EMLA) cream 1 Application, 1 Application, Topical, PRN, Tomasa Blase, PA-C  LORazepam (ATIVAN) tablet 0.5 mg, 0.5 mg, Oral, QHS, Nadara Mustard, MD, 0.5 mg at 11/18/22 2138   magnesium citrate solution 1 Bottle, 1 Bottle, Oral, Once PRN, Nadara Mustard, MD   methocarbamol (ROBAXIN) tablet 500  mg, 500 mg, Oral, Q6H PRN, 500 mg at 11/17/22 2227 **OR** methocarbamol (ROBAXIN) 500 mg in dextrose 5 % 50 mL IVPB, 500 mg, Intravenous, Q6H PRN, Nadara Mustard, MD   metoCLOPramide (REGLAN) tablet 5-10 mg, 5-10 mg, Oral, Q8H PRN **OR** metoCLOPramide (REGLAN) injection 5-10 mg, 5-10 mg, Intravenous, Q8H PRN, Nadara Mustard, MD   midodrine (PROAMATINE) tablet 10 mg, 10 mg, Oral, Q T,Th,Sa-HD, Nadara Mustard, MD, 10 mg at 11/19/22 2595   multivitamin (RENA-VIT) tablet 1 tablet, 1 tablet, Oral, QHS, Rodolph Bong, MD, 1 tablet at 11/18/22 2138   oxyCODONE (Oxy IR/ROXICODONE) immediate release tablet 5 mg, 5 mg, Oral, Q4H PRN, Rodolph Bong, MD, 5 mg at 11/19/22 0513   pantoprazole (PROTONIX) EC tablet 40 mg, 40 mg, Oral, BID AC, Nadara Mustard, MD, 40 mg at 11/18/22 1707   pentafluoroprop-tetrafluoroeth (GEBAUERS) aerosol 1 Application, 1 Application, Topical, PRN, Ejigiri, Ogechi Grace, PA-C   polyethylene glycol (MIRALAX / GLYCOLAX) packet 17 g, 17 g, Oral, Daily PRN, Nadara Mustard, MD   polyethylene glycol (MIRALAX / GLYCOLAX) packet 17 g, 17 g, Oral, Daily, Rodolph Bong, MD, 17 g at 11/18/22 0815   pregabalin (LYRICA) capsule 150 mg, 150 mg, Oral, BID, Nadara Mustard, MD, 150 mg at 11/18/22 2138   ramelteon (ROZEREM) tablet 8 mg, 8 mg, Oral, QHS, Carrion-Carrero, Margely, MD, 8 mg at 11/18/22 2139   senna-docusate (Senokot-S) tablet 2 tablet, 2 tablet, Oral, BID, Nadara Mustard, MD, 2 tablet at 11/18/22 2138   sevelamer carbonate (RENVELA) tablet 1,600 mg, 1,600 mg, Oral, TID WC, Nadara Mustard, MD, 1,600 mg at 11/18/22 1707   sorbitol 70 % solution 30 mL, 30 mL, Oral, PRN, Nadara Mustard, MD   venlafaxine XR (EFFEXOR-XR) 24 hr capsule 75 mg, 75 mg, Oral, Q breakfast, Rodolph Bong, MD, 75 mg at 11/18/22 6387   zinc sulfate capsule 220 mg, 220 mg, Oral, Daily, Nadara Mustard, MD, 220 mg at 11/17/22 0801   zolpidem (AMBIEN) tablet 5 mg, 5 mg, Oral, QHS PRN, Nadara Mustard,  MD   Patients Current Diet:  Diet Order                  Diet regular Room service appropriate? Yes; Fluid consistency: Thin; Fluid restriction: 1500 mL Fluid  Diet effective now                       Precautions / Restrictions Precautions Precautions: Fall Precaution Comments: LLE wound Restrictions Weight Bearing Restrictions: Yes LLE Weight Bearing: Weight bearing as tolerated    Has the patient had 2 or more falls or a fall with injury in the past year? No   Prior Activity Level Limited Community (1-2x/wk): nonambulatory for 9 months since BKA. hoyer to wheelchair; sleeps in recliner   Prior Functional Level Self Care: Did the patient need help bathing, dressing, using the toilet or eating? Needed some help   Indoor Mobility: Did the patient need assistance with walking from room to room (with or without device)? Dependent   Stairs: Did the patient need assistance with internal or external stairs (with or without device)? Dependent   Functional Cognition: Did the patient need help planning  regular tasks such as shopping or remembering to take medications? Needed some help   Patient Information Are you of Hispanic, Latino/a,or Spanish origin?: A. No, not of Hispanic, Latino/a, or Spanish origin What is your race?: A. White Do you need or want an interpreter to communicate with a doctor or health care staff?: 0. No   Patient's Response To:  Health Literacy and Transportation Is the patient able to respond to health literacy and transportation needs?: Yes Health Literacy - How often do you need to have someone help you when you read instructions, pamphlets, or other written material from your doctor or pharmacy?: Never In the past 12 months, has lack of transportation kept you from medical appointments or from getting medications?: No In the past 12 months, has lack of transportation kept you from meetings, work, or from getting things needed for daily living?: No    Home Assistive Devices / Equipment Home Equipment: Grab bars - tub/shower, Agricultural consultant (2 wheels), Rollator (4 wheels), BSC/3in1, Cane - quad, Shower seat, Wheelchair - manual   Prior Device Use: Indicate devices/aids used by the patient prior to current illness, exacerbation or injury? Manual wheelchair and Mechanical lift   Current Functional Level Cognition   Overall Cognitive Status: Within Functional Limits for tasks assessed Orientation Level: Oriented X4    Extremity Assessment (includes Sensation/Coordination)   Upper Extremity Assessment: RUE deficits/detail, LUE deficits/detail RUE Deficits / Details: B hand neuropathy, B shoudler pain with AROM, functional ROM RUE: Shoulder pain with ROM RUE Sensation: history of peripheral neuropathy RUE Coordination: decreased fine motor LUE Deficits / Details: B hand neuropathy, B shoudler pain with AROM, functional ROM LUE: Shoulder pain with ROM LUE Sensation: history of peripheral neuropathy LUE Coordination: decreased fine motor  Lower Extremity Assessment: RLE deficits/detail, LLE deficits/detail RLE Deficits / Details: hx BKA LLE Deficits / Details: Bandaged, gross weakness present, able to lift LE agains gravity through partial range. LLE: Unable to fully assess due to pain     ADLs   Overall ADL's : Needs assistance/impaired Eating/Feeding: Set up, Bed level Eating/Feeding Details (indicate cue type and reason): difficulty opening packages, drinks, hand weakness/neuropathy Grooming: Set up, Bed level Upper Body Bathing: Moderate assistance Lower Body Bathing: Maximal assistance Upper Body Dressing : Minimal assistance Lower Body Dressing: Maximal assistance Toilet Transfer: Maximal assistance, +2 for physical assistance, +2 for safety/equipment Toilet Transfer Details (indicate cue type and reason): with Stedy Toileting- Clothing Manipulation and Hygiene: Total assistance Functional mobility during ADLs: Maximal  assistance, +2 for physical assistance, +2 for safety/equipment General ADL Comments: Pt requires significant assistance for ADLs, able to perform some UB ADLs, hand neuropathy and weak grip limt participation     Mobility   Overal bed mobility: Needs Assistance Bed Mobility: Rolling Rolling: Supervision, Mod assist Supine to sit: HOB elevated, Min assist Sit to supine: Min guard General bed mobility comments: Performed rolling to L to reposition bed pad in the correct position.  Pt performed to the R with mod assistance.     Transfers   Overall transfer level:  (deferred as she felt dizzy after dialysis session.) Equipment used: Ambulation equipment used Transfers: Sit to/from Stand Sit to Stand: Max assist, +2 physical assistance, From elevated surface General transfer comment: Performed sit<>stand with Antony Salmon Max A +2 for the first 2 attempts with cues for technique and upright posture for full hip extenstion to neutral. 3rd attempt Mod assist +2, fear of fallnig forward. Cues for UE use to push upright. Tolerated each  stand up to 10 seconds. Was not utilizing knee pad on Lt first two attempts, third attempt tolerated better with this block to assist for leverage.     Ambulation / Gait / Stairs / Wheelchair Mobility   Ambulation/Gait General Gait Details: non ambulatory for quite some time.     Posture / Balance Dynamic Sitting Balance Sitting balance - Comments: able to balance sitting EOB Balance Overall balance assessment: Needs assistance Sitting-balance support: No upper extremity supported, Feet supported Sitting balance-Leahy Scale: Fair Sitting balance - Comments: able to balance sitting EOB Standing balance support: Bilateral upper extremity supported, Reliant on assistive device for balance Standing balance-Leahy Scale: Zero Standing balance comment: Held rail on stedy, Mod A +2 to stabilize.     Special needs/care consideration Date of Service: 11/10/2022  7:21 PM      Show:Clear all [x] Written[x] Templated[] Copied   Added by: [x] McNichol, Bonney Aid, RN   [] Hover for details WOC Nurse Consult Note: Reason for Consult:LLE with acute infestation of larvae. Dr. Lajoyce Corners is consulting and plans for surgical procedure on Wednesday or Friday of this week.Photodocumentation of wound is provided to the EMR by Provider and is appreciated. Also noted is a partial thickness Stage 2 pressure injury to the sacrum, 2cm x 3cm and superficial, depth is 0.1cm Wound type:Infectious Pressure Injury POA: N/A Measurement:Last measurement by Dr, Audrie Lia staff is 5cm x 3cm with depth obscured by the presence of nonviable tissue. Wound ZOX:[id number] tissue. Drainage (amount, consistency, odor) small serosanguinous Periwound:erythematous, edematous Dressing procedure/placement/frequency:I have provided Nursing with guidance in the care of this wound using sodium hypochlorite (Dakin's) solution to both cleanse and dress the wound. One-quarter strength sodium hypochlorite solution will be used twice daily to moisten gauze and be placed as a wound contact layer. This is to be covered with dry gauze and secured with Kerlix roll gauze/paper tape. Stage 2 PI to the sacrum will be treated with a daily cleanse followed by covering of the lesion with a xeroform gauze. This will be topped with a dry gauze and secured with a silicone foam. The xeroform is to be changed daily, The silicone foam may be reused for up to 3 days and changed PRN soiling.   WOC nursing team will not follow, but will remain available to this patient, the nursing and medical teams.  Please re-consult if needed.   Thank you for inviting [name] to participate in this patient's Plan of Care.   Ladona Mow, MSN, RN, CNS, GNP, CWOCN, CWON-AP, WOCNF, FAAN  Mobile phone:  [phone]       Wound VAC to LLE after surgical debridement Psychiatry consultation for med management [facility] notified to have  prosthetic leg brought for fitting and use during CIR admit on 6/26 ( Bobby) Spouse pays privately for wheelchair transport to OP dialysis      Previous Home Environment  Living Arrangements: Spouse/significant other  Lives With: Spouse Available Help at Discharge: Family, Available 24 hours/day (spouse is on FMLA, may retire early when [employer] over; can provide 24/7) Type of Home: House Home Layout: One level Home Access: Ramped entrance Bathroom Shower/Tub: Tub/shower unit, Door [employer] Toilet: Handicapped height Bathroom Accessibility: Yes How Accessible: Accessible via walker Additional Comments: Husband is out on FMLA until Sept. She was scheduled to get a prosthesis for RLE prior to admission   Discharge Living Setting Plans for Discharge Living Setting: Patient's home, Lives with (comment) (spouse) Type of Home at Discharge: House Discharge Home Layout: One level Discharge Home Access:  Ramped entrance Discharge Bathroom Shower/Tub: Tub/shower unit Discharge Bathroom Toilet: Handicapped height Discharge Bathroom Accessibility: Yes How Accessible: Accessible via walker Does the patient have any problems obtaining your medications?: No   Social/Family/Support Systems Patient Roles: Spouse Contact Information: spouse. "Joe" Ramon Dredge Anticipated Caregiver: spouse Anticipated Industrial/product designer Information: see contacts Ability/Limitations of Caregiver: mod assist hoyer level he can provide Caregiver Availability: 24/7 Discharge Plan Discussed with Primary Caregiver: Yes Is Caregiver In Agreement with Plan?: Yes Does Caregiver/Family have Issues with Lodging/Transportation while Pt is in Rehab?: No   Goals Patient/Family Goal for Rehab: min to mod assist with PT and OT at wheelchair level Expected length of stay: ELOS 2 weeks Additional Information: Spouse pays privately for whellchair transport to outpatient hemodialysis Pt/Family Agrees to Admission and willing to  participate: Yes Program Orientation Provided & Reviewed with Pt/Caregiver Including Roles  & Responsibilities: Yes Additional Information Needs: [facility] has prosthetic leg for fitting. Needs to be utilized during her rehab stay/ AUth was given to incorporate use of prosthetic leg training  Barriers to Discharge: Insurance for SNF coverage (she has already used all 100 days of SNF coverage. D/c'd home 09/23/22)   Decrease burden of Care through IP rehab admission: Decrease number of caregivers   Possible need for SNF placement upon discharge: no, has used all 100 days of SNF benefits. D/c'd form [facility] 09/23/22. Hoyer lift usage at home.    Patient Condition: I have reviewed medical records from [name], spoken with  patient and spouse. I met with patient at the bedside for inpatient rehabilitation assessment.  Patient will benefit from ongoing PT and OT, can actively participate in 3 hours of therapy a day 5 days of the week, and can make measurable gains during the admission.  Patient will also benefit from the coordinated team approach during an Inpatient Acute Rehabilitation admission.  The patient will receive intensive therapy as well as Rehabilitation physician, nursing, social worker, and care management interventions.  Due to bladder management, bowel management, safety, skin/wound care, disease management, medication administration, pain management, and patient education the patient requires 24 hour a day rehabilitation nursing.  The patient is currently mod assist overall with mobility and basic ADLs.  Discharge setting and therapy post discharge at home with home health is anticipated.  Patient has agreed to participate in the Acute Inpatient Rehabilitation Program and will admit today.   Preadmission Screen Completed By:  Worthy Keeler MSN 11/19/2022 8:32 AM ______________________________________________________________________   Discussed status with Dr.  Natale Lay on 11/19/22 at 1038 and received approval for admission today.   Admission Coordinator:  Clois Dupes, RN, MSN time 1038 Date 11/19/22    Assessment/Plan: Diagnosis: Debility due to L lower extremity wound infection/necrotic wound with maggots Does the need for close, 24 hr/day Medical supervision in concert with the patient's rehab needs make it unreasonable for this patient to be served in a less intensive setting? Yes Co-Morbidities requiring supervision/potential complications: CAD, CHF diastolic, ESRD TTS HD, PVD, S/P R BKA, hypothyroidism, obesity, afib, DM2, afib, PVD, morbid obesity, MDD/GAD, dizziness, sacral pressure injury Due to bladder management, bowel management, safety, skin/wound care, disease management, medication administration, pain management, and patient education, does the patient require 24 hr/day rehab nursing? Yes Does the patient require coordinated care of a physician, rehab nurse, PT, OT, and SLP to address physical and functional deficits in the context of the above medical diagnosis(es)? Yes Addressing deficits in the following areas: balance, endurance, locomotion, strength, transferring, bowel/bladder control,  bathing, dressing, feeding, grooming, toileting, and psychosocial support Can the patient actively participate in an intensive therapy program of at least 3 hrs of therapy 5 days a week? Yes The potential for patient to make measurable gains while on inpatient rehab is excellent Anticipated functional outcomes upon discharge from inpatient rehab: min assist and mod assist PT, min assist and mod assist OT, n/a SLP Estimated rehab length of stay to reach the above functional goals is: 14 days Anticipated discharge destination: Home 10. Overall Rehab/Functional Prognosis: good     MD Signature: Benjie Karvonen Shtridelman   Admitting 11/20/22 due to BP issues 6/26 after hemodialysis. Dr Shearon Stalls notified at 1231 on 11/20/22 of admission.  Ottie Glazier RN MSN          Revision History             Revision History

## 2022-11-20 NOTE — Progress Notes (Signed)
PT Cancellation Note  Patient Details Name: Susan [name] MRN: [id number] DOB: 02-07-1951   Cancelled Treatment:    Reason Eval/Treat Not Completed: Patient at procedure or test/unavailable  Off unit for HD, will follow-up this afternoon.  Kathlyn Sacramento, PT, DPT [facility]  Rehabilitation Services Physical Therapist Office: [phone] Website: [email or web address]   Berton Mount 11/20/2022, 10:02 AM

## 2022-11-20 NOTE — Progress Notes (Signed)
OT Cancellation Note  Patient Details Name: DARNELLA ZEITER MRN: [id number] DOB: 07-22-1950   Cancelled Treatment:    Reason Eval/Treat Not Completed: Patient at procedure or test/ unavailable (Pt at HD, OT will f/u with patient as able)  11/20/2022  AB, OTR/L  Acute Rehabilitation Services  Office: [phone]   Tristan Schroeder 11/20/2022, 9:32 AM

## 2022-11-20 NOTE — Progress Notes (Signed)
Received patient in bed to unit.  Alert and oriented.  Informed consent signed and in chart.   TX duration:3.5  Patient tolerated well.  Transported back to the room  Alert, without acute distress.  Hand-off given to patient's nurse.   Access used: right HD catheter Access issues: none  Total UF removed: 2L Medication(s) given: none   11/20/22 1229  Vitals  Temp 98 F (36.7 C)  Temp Source Oral  BP (!) 127/49  MAP (mmHg) 73  BP Location Right Arm  BP Method Automatic  Patient Position (if appropriate) Lying  Pulse Rate (!) 56  Pulse Rate Source Monitor  ECG Heart Rate (!) 57  Resp 14  Oxygen Therapy  SpO2 99 %  O2 Device Nasal Cannula  O2 Flow Rate (L/min) 2 L/min  During Treatment Monitoring  HD Safety Checks Performed Yes  Intra-Hemodialysis Comments Tx completed;Tolerated well  Dialysis Fluid Bolus Normal Saline  Bolus Amount (mL) 300 mL  Fistula / Graft Left Upper arm Arteriovenous fistula  Placement Date/Time: 07/31/22 7846   Orientation: Left  Access Location: Upper arm  Access Type: (c) Arteriovenous fistula  Site Condition No complications  Fistula / Graft Assessment Absent;Thrill;Bruit  Status Deaccessed  Drainage Description None  Hemodialysis Catheter Right Subclavian  Placement Date: 06/06/22   Placed prior to admission: Yes  Orientation: Right  Access Location: Subclavian  Site Condition No complications  Blue Lumen Status Flushed;Heparin locked  Red Lumen Status Flushed;Heparin locked  Purple Lumen Status N/A  Catheter fill solution Heparin 1000 units/ml  Catheter fill volume (Arterial) 1.9 cc  Catheter fill volume (Venous) 1.9  Dressing Type Transparent  Dressing Status Antimicrobial disc in place;Clean, Dry, Intact  Drainage Description None  Dressing Change Due 11/26/22  Post treatment catheter status Capped and Clamped      Susan [name] S [facility]

## 2022-11-20 NOTE — Plan of Care (Addendum)
Wound Plan    Wounds present:#1  LLE with acute infestation of larvae. Dr. Lajoyce Corners is consulting and plans for surgical procedure on Wednesday or Friday of this week.Photodocumentation of wound is provided to the EMR by Provider and is appreciated   Interventions: Dressing procedure/placement/frequency:I have provided Nursing with guidance in the care of this wound using sodium hypochlorite (Dakin's) solution to both cleanse and dress the wound. One-quarter strength sodium hypochlorite solution will be used twice daily to moisten gauze and be placed as a wound contact layer. This is to be covered with dry gauze and secured with Kerlix roll gauze/paper tape.   11/14/22; Wound vac application  11/21/22; Wound vac removed; Left lower extremity wound care is to change daily with Silvadene applied gauze to keep wound moist but not saturated.  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 Wound EXB:[id number] tissue. Drainage (amount, consistency, odor) small serosanguinous Periwound:erythematous, edematous  Interventions:  Stage 2 PI to the sacrum will be treated with a daily cleanse followed by covering of the lesion with a xeroform gauze. This will be topped with a dry gauze and secured with a silicone foam. The xeroform is to be changed daily, The silicone foam may be reused for up to 3 days and changed PRN soiling.  Braden Score: 16 Sensory: 3 Moisture: 4 Activity: 2 Mobility: 3 Nutrition: 3 Friction: 1  Additional interventions: Liberalize diet to Carb Modified Nepro Shake po once daily, each supplement provides 425 kcal and 19 grams protein 30 ml ProSource Plus BID, each supplement provides 100 kcals and 15 grams protein.  Vitamin C 500mg   daily Zinc 220mg  BID x14 days 1500 CC FR  Contributors: Ladona Mow, MSN, RN, CNS, GNP, CWOCN, CWON-AP, WOCNF, FAAN  Drusilla Kanner, RDN, LDN  Chana Bode, RN-BC, CRRN

## 2022-11-20 NOTE — Progress Notes (Signed)
    Patient Name: RAINAH KIRSHNER           DOB: Dec 21, 1950  MRN: [id number]      Admission Date: 11/10/2022  Attending Provider: Joycelyn Das, MD  Primary Diagnosis: Complicated wound infection   Level of care: Med-Surg    CROSS COVER NOTE   Date of Service   11/20/2022   Maryclaire Stoecker [facility], 72 y.o. female, was admitted on 11/10/2022 for Complicated wound infection.    HPI/Events of Note   Hypotension SBP 80s, DBP 30-40s with MAP< 60.  Afebrile and hemodynamically stable.  No signs of infection.  No active blood loss.  Patient had HD 6/26 and had 3L removed.  Shortly after dialysis, patient was hypotensive and required 500 cc NS bolus. No other changes reported.  Mentation at baseline.  All other vital signs stable Will trial fluid bolus, IV albumin, and/or midodrine  Shortly after initial fluid bolus, midodrine, IV albumin, BP 90/48.  Additional fluid and midodrine orders placed.   Interventions/ Plan   Fluid bolus, 500 cc total IV albumin Midodrine p.o., 15 mg total        Anthoney Harada, DNP, [employer]

## 2022-11-20 NOTE — Progress Notes (Signed)
Inpatient Rehabilitation Admission Medication Review by a Pharmacist  A complete drug regimen review was completed for this patient to identify any potential clinically significant medication issues.  High Risk Drug Classes Is patient taking? Indication by Medication  Antipsychotic No   Anticoagulant Yes Apixaban - afib/stroke ppx  Antibiotic Yes, as an intravenous medication Cefazolin - cellulitis tx  Opioid Yes Oxycodone - PRN pain  Antiplatelet No   Hypoglycemics/insulin Yes Insulin aspart, glargine - DM  Vasoactive Medication Yes Amiodarone - afib Midodrine - hypotention  Chemotherapy No   Other Yes Vit C, FA, MVI, Zinc - supplement Atorvastatin - HLD Pantoprazole - GERD Miralax, senokot-S - constipation Ramelteon - sleep Renvela - ESRD hyperphosphate Venlafaxine - mood Duoneb - SOB Methocarbamol - muscle spasms Reglan - PRN nausea/vomiting     Type of Medication Issue Identified Description of Issue Recommendation(s)  Drug Interaction(s) (clinically significant)     Duplicate Therapy     Allergy     No Medication Administration End Date     Incorrect Dose     Additional Drug Therapy Needed     Significant med changes from prior encounter (inform family/care partners about these prior to discharge). Vortioxetine, Percocet stopped at DC Communicate medication changes with patient/family at discharge  Other       Clinically significant medication issues were identified that warrant physician communication and completion of prescribed/recommended actions by midnight of the next day:  No   Pharmacist comments: n/a   Time spent performing this drug regimen review (minutes): 20   Thank you for allowing pharmacy to be a part of this patient's care.  Thelma Barge, PharmD Clinical Pharmacist

## 2022-11-20 NOTE — Progress Notes (Signed)
Patient ID: Susan [name], female   DOB: Dec 30, 1950, 72 y.o.   MRN: [id number] Patient feels overwhelmed with all of her medical conditions.  She states they drew off too much fluid with dialysis yesterday.  The left leg dressing is clean and dry.  This should be changed daily and Silvadene may be applied to the gauze to keep the wound moist but not saturated.  Anticipate discharge to inpatient rehab today.

## 2022-11-20 NOTE — H&P (Addendum)
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 She was at [facility] since BKA revision 01/24 and has multiple admission due to medical issues. She was unable to bear weight on leg and eventually discharged to home 09/23/22 doing hoyer lifts.  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 She was seen in office by Dr. Lajoyce Corners on 11/10/22 and found to have increase in necrotic tissue with maggots in the wound and sent to the hospital for admission and management.       She was started on IV Zosyn/Vanc and patient underwent I and D with placement of wound VAC on 06/21 by Dr. Lajoyce Corners.  She was found to have anhedonia, sadness,acute on chronic insomnia and episodes of panic attacks. Dr. Gasper Sells consulted for input on 06/18 due to long standing hx of MDD/GAD w/panic attacks and patient transitioned from Vortioxetine to Venlafaxine XR. She did express wish to stop HD and transfer to Hospice if leg could not be salvaged and Palliative care consulted recommended to discuss GOC. Her blood cultures negative and wound culture grew out proteus mirabilis, Klebsiella pneumoniae and Enterococcus fecalis. Antibiotics changed to Cefazolin with recommendations to complete 2 week course  treatment with EOT 07/06 per Dr. Danelle Earthly. HD has been ongoing with extra HD sessions help manage fluid overload with SOB. On 06/26 after HD session, she was found to have significant hypotension with SBP in 70's, lethargy and asterixis. She was treated with fluid boluses with improvement. Nephrology felt that abnormal movements due to lyrica which was discontinued.       Review of Systems  Musculoskeletal:  Positive for myalgias.  Neurological:  Positive for headaches (daily).            Past Medical History:  Diagnosis Date   Anemia      hx   Anxiety     Arthritis      "generalized" (03/15/2014)   CAD (coronary artery disease)      MI in 2000 - MI  2007 - treated bare metal stent (no nuclear since then as 9/11)   Carotid artery disease (HCC)     Chronic diastolic heart failure (HCC)      a) ECHO (08/2013) EF 55-60% and RV function nl b) RHC (08/2013) RA 4, RV 30/5/7, PA 25/10 (16), PCWP 7, Fick CO/CI 6.3/2.7, PVR 1.5 WU, PA 61 and 66%   Daily headache      "~ every other day; since I fell in June" (03/15/2014)   Depression     Diabetic retinopathy (HCC)     Dyslipidemia     ESRD (end stage renal disease) (HCC)      Dialysis on Tues Thurs Sat   Exertional shortness of breath     History of kidney stones     HTN (  hypertension)     Hypothyroidism     Obesity     Osteoarthritis     PAF (paroxysmal atrial fibrillation) (HCC)     Peripheral neuropathy      bilateral feet/hands   PONV (postoperative nausea and vomiting)     RBBB (right bundle branch block)      Old   Stroke (HCC)      mini strokes   Type II diabetes mellitus (HCC)      Type II, [name]. patient has omnipod insulin pump with Novolin R Insulin           Past Surgical History:  Procedure Laterality Date   A/V FISTULAGRAM Left 11/07/2022    Procedure: A/V Fistulagram;  Surgeon: Leonie Douglas, MD;  [facility]: [facility];  Service: Cardiovascular;  Laterality: Left;    ABDOMINAL HYSTERECTOMY   1980's   AMPUTATION Right 02/24/2018    Procedure: RIGHT FOOT GREAT TOE AND 2ND TOE AMPUTATION;  Surgeon: Nadara Mustard, MD;  Location: [facility] OR;  Service: Orthopedics;  Laterality: Right;   AMPUTATION Right 04/30/2018    Procedure: RIGHT TRANSMETATARSAL AMPUTATION;  Surgeon: Nadara Mustard, MD;  Location: [facility] OR;  Service: Orthopedics;  Laterality: Right;   AMPUTATION Right 05/02/2022    Procedure: RIGHT BELOW KNEE AMPUTATION;  Surgeon: Nadara Mustard, MD;  Location: [facility] OR;  Service: Orthopedics;  Laterality: Right;   APPLICATION OF WOUND VAC Right 06/13/2022    Procedure: APPLICATION OF WOUND VAC;  Surgeon: Nadara Mustard, MD;  Location: [facility] OR;  Service: Orthopedics;  Laterality: Right;   APPLICATION OF WOUND VAC Left 11/14/2022    Procedure: APPLICATION OF WOUND VAC;  Surgeon: Nadara Mustard, MD;  [facility]: [facility] OR;  Service: Orthopedics;  Laterality: Left;   AV FISTULA PLACEMENT Left 04/02/2022    Procedure: LEFT ARM ARTERIOVENOUS (AV) FISTULA CREATION;  Surgeon: Nada Libman, MD;  Location: [facility] OR;  Service: Vascular;  Laterality: Left;  PERIPHERAL NERVE BLOCK   BASCILIC VEIN TRANSPOSITION Left 07/31/2022    Procedure: LEFT ARM SECOND STAGE BASILIC VEIN TRANSPOSITION;  Surgeon: Nada Libman, MD;  [facility]: [facility] OR;  Service: Vascular;  Laterality: Left;   BIOPSY   05/27/2020    Procedure: BIOPSY;  Surgeon: Lanelle Bal, DO;  [facility]: [facility];  Service: Endoscopy;;   CATARACT EXTRACTION, BILATERAL Bilateral ?2013   COLONOSCOPY W/ POLYPECTOMY       COLONOSCOPY WITH PROPOFOL N/A 03/13/2019    Procedure: COLONOSCOPY WITH PROPOFOL;  Surgeon: Beverley Fiedler, MD;  Location: [facility] ENDOSCOPY;  Service: Gastroenterology;  Laterality: N/A;   CORONARY ANGIOPLASTY WITH STENT PLACEMENT   1999; 2007    "1 + 1"   ERCP N/A 02/03/2022    Procedure: ENDOSCOPIC RETROGRADE CHOLANGIOPANCREATOGRAPHY (ERCP);  Surgeon: Jeani Hawking, MD;  Location: [facility] ENDOSCOPY;  Service: Gastroenterology;   Laterality: N/A;   ESOPHAGOGASTRODUODENOSCOPY (EGD) WITH PROPOFOL N/A 03/13/2019    Procedure: ESOPHAGOGASTRODUODENOSCOPY (EGD) WITH PROPOFOL;  Surgeon: Beverley Fiedler, MD;  Location: [facility] ENDOSCOPY;  Service: Gastroenterology;  Laterality: N/A;   ESOPHAGOGASTRODUODENOSCOPY (EGD) WITH PROPOFOL N/A 05/27/2020    Procedure: ESOPHAGOGASTRODUODENOSCOPY (EGD) WITH PROPOFOL;  Surgeon: Lanelle Bal, DO;  [facility];  Service: Endoscopy;  Laterality: N/A;   ESOPHAGOGASTRODUODENOSCOPY (EGD) WITH PROPOFOL N/A 09/03/2022    Procedure: ESOPHAGOGASTRODUODENOSCOPY (EGD) WITH PROPOFOL;  Surgeon: Jeani Hawking, MD;  Location: [facility] ENDOSCOPY;  Service: Gastroenterology;  Laterality: N/A;   EYE SURGERY Bilateral  lazer   FLEXIBLE SIGMOIDOSCOPY N/A 05/23/2022    Procedure: FLEXIBLE SIGMOIDOSCOPY;  Surgeon: Jeani Hawking, MD;  Location: [facility] ENDOSCOPY;  Service: Gastroenterology;  Laterality: N/A;   FLEXIBLE SIGMOIDOSCOPY N/A 05/24/2022    Procedure: FLEXIBLE SIGMOIDOSCOPY;  Surgeon: Imogene Burn, MD;  Location: [facility] ENDOSCOPY;  Service: Gastroenterology;  Laterality: N/A;   FLEXIBLE SIGMOIDOSCOPY N/A 09/03/2022    Procedure: FLEXIBLE SIGMOIDOSCOPY;  Surgeon: Jeani Hawking, MD;  Location: [facility] ENDOSCOPY;  Service: Gastroenterology;  Laterality: N/A;   HEMOSTASIS CLIP PLACEMENT   03/13/2019    Procedure: HEMOSTASIS CLIP PLACEMENT;  Surgeon: Beverley Fiedler, MD;  Location: [facility] ENDOSCOPY;  Service: Gastroenterology;;   HEMOSTASIS CLIP PLACEMENT   05/23/2022    Procedure: HEMOSTASIS CLIP PLACEMENT;  Surgeon: Jeani Hawking, MD;  Location: [facility] ENDOSCOPY;  Service: Gastroenterology;;   HEMOSTASIS CONTROL   05/24/2022    Procedure: HEMOSTASIS CONTROL;  Surgeon: Imogene Burn, MD;  Location: [facility] ENDOSCOPY;  Service: Gastroenterology;;   HOT HEMOSTASIS N/A 05/23/2022    Procedure: HOT HEMOSTASIS (ARGON PLASMA COAGULATION/BICAP);  Surgeon: Jeani Hawking, MD;  Location: [facility] ENDOSCOPY;  Service: Gastroenterology;   Laterality: N/A;   I & D EXTREMITY Left 05/05/2022    Procedure: IRRIGATION AND DEBRIDEMENT LEFT ARM AV FISTULA;  Surgeon: Cephus Shelling, MD;  Location: [facility] OR;  Service: Vascular;  Laterality: Left;   I & D EXTREMITY N/A 11/14/2022    Procedure: IRRIGATION AND DEBRIDEMENT OF LOWER EXTREMITY WOUND;  Surgeon: Nadara Mustard, MD;  [facility]: [facility] OR;  Service: Orthopedics;  Laterality: N/A;   INSERTION OF DIALYSIS CATHETER Right 04/02/2022    Procedure: INSERTION OF TUNNELED DIALYSIS CATHETER;  Surgeon: Nada Libman, MD;  Location: [facility] OR;  Service: Vascular;  Laterality: Right;   KNEE ARTHROSCOPY Left 10/25/2006   POLYPECTOMY   03/13/2019    Procedure: POLYPECTOMY;  Surgeon: Beverley Fiedler, MD;  Location[facility] ENDOSCOPY;  Service: Gastroenterology;;   REMOVAL OF STONES   02/03/2022    Procedure: REMOVAL OF STONES;  Surgeon: Jeani Hawking, MD;  Location: [facility] ENDOSCOPY;  Service: Gastroenterology;;   REVISON OF ARTERIOVENOUS FISTULA Left 08/20/2022    Procedure: REVISON OF LEFT ARM ARTERIOVENOUS FISTULA;  Surgeon: Nada Libman, MD;  [facility] OR;  Service: Vascular;  Laterality: Left;   RIGHT HEART CATH N/A 07/24/2017    Procedure: RIGHT HEART CATH;  Surgeon: Dolores Patty, MD;  [facility];  Service: Cardiovascular;  Laterality: N/A;   RIGHT HEART CATHETERIZATION N/A 09/22/2013    Procedure: RIGHT HEART CATH;  Surgeon: Dolores Patty, MD;  Location: [facility] CATH LAB;  Service: Cardiovascular;  Laterality: N/A;   SHOULDER ARTHROSCOPY WITH OPEN ROTATOR CUFF REPAIR Right 03/14/2014    Procedure: RIGHT SHOULDER ARTHROSCOPY WITH BICEPS RELEASE, OPEN SUBSCAPULA REPAIR, OPEN SUPRASPINATUS REPAIR.;  Surgeon: Cammy Copa, MD;  Location: [facility] OR;  Service: Orthopedics;  Laterality: Right;   SPHINCTEROTOMY   02/03/2022    Procedure: SPHINCTEROTOMY;  Surgeon: Jeani Hawking, MD;  Location: [facility] ENDOSCOPY;  Service: Gastroenterology;;   STUMP REVISION Right 06/13/2022    Procedure:  REVISION RIGHT BELOW KNEE AMPUTATION;  Surgeon: Nadara Mustard, MD;  Location: [facility] OR;  Service: Orthopedics;  Laterality: Right;   TEE WITHOUT CARDIOVERSION N/A 02/04/2022    Procedure: TRANSESOPHAGEAL ECHOCARDIOGRAM (TEE);  Surgeon: Dolores Patty, MD;  Location: [facility] ENDOSCOPY;  Service: Cardiovascular;  Laterality: N/A;   THROMBECTOMY W/ EMBOLECTOMY Left 08/20/2022    Procedure: THROMBECTOMY OF LEFT ARM ARTERIOVENOUS FISTULA;  Surgeon: Myra Gianotti,  Fran Lowes, MD;  Location: [facility] OR;  Service: Vascular;  Laterality: Left;   TOE AMPUTATION Right 02/24/2018    GREAT TOE AND 2ND TOE AMPUTATION   TUBAL LIGATION   1970's           Family History  Problem Relation Age of Onset   Heart attack Mother 87      Social History: Married. Lives with husband and has been non-ambulatory since amputation. Husband does hoyer lifts and assists with ADLs. She  reports that she quit smoking about 25 years ago. Her smoking use included cigarettes. She has a 96.00 pack-year smoking history. She has never used smokeless tobacco. She reports that she does not currently use alcohol. She reports that she does not use drugs.         Allergies  Allergen Reactions   Cephalexin Diarrhea and Other (See Comments)   Codeine Nausea And Vomiting and Other (See Comments)               Facility-Administered Medications Prior to Admission  Medication Dose Route Frequency Provider Last Rate Last Admin   0.9 %  sodium chloride infusion  250 mL Intravenous PRN Leonie Douglas, MD       [DISCONTINUED] 0.9 %  sodium chloride infusion  250 mL Intravenous PRN Victorino Sparrow, MD       [DISCONTINUED] sodium chloride flush (NS) 0.9 % injection 3 mL  3 mL Intravenous Q12H Victorino Sparrow, MD       [DISCONTINUED] sodium chloride flush (NS) 0.9 % injection 3 mL  3 mL Intravenous Q12H Leonie Douglas, MD              Medications Prior to Admission  Medication Sig Dispense Refill   albuterol (VENTOLIN HFA) 108 (90 Base) MCG/ACT  inhaler Inhale 1-2 puffs into the lungs every 6 (six) hours as needed for wheezing or shortness of breath.       amiodarone (PACERONE) 200 MG tablet Take 200 mg by mouth daily.       apixaban (ELIQUIS) 5 MG TABS tablet Take 1 tablet (5 mg total) by mouth 2 (two) times daily. 60 tablet     atorvastatin (LIPITOR) 80 MG tablet Take 1 tablet (80 mg total) by mouth daily.       diphenoxylate-atropine (LOMOTIL) 2.5-0.025 MG tablet Take 1 tablet by mouth 4 (four) times daily as needed for diarrhea or loose stools. 30 tablet 0   ferrous sulfate 325 (65 FE) MG tablet Take 325 mg by mouth 2 (two) times daily with a meal. (1000 & 2100)       hydrocortisone (ANUSOL-HC) 2.5 % rectal cream Place 1 Application rectally daily as needed for hemorrhoids. (1000 & 2100)       insulin aspart (NOVOLOG FLEXPEN) 100 UNIT/ML FlexPen Inject 0-15 Units into the skin See admin instructions. Inject 12 units subcutaneously 3 times daily with meals (0900, 1300 & 1700) & as needed via sliding scale insulin CBG <70=Notify NP/MD 71-149=0 units,150-199=4 units, 200-249=6 units,250- 299=10 units, 300-349=12 units, 350-399=15 units, > 400 call MD ( prime pen with 2 units prior  to set dose) (Patient taking differently: Inject 0-16 Units into the skin See admin instructions. Inject 12 units subcutaneously 3 times daily with meals (0900, 1300 & 1700) & as needed via sliding scale insulin CBG <70=Notify NP/MD 71-149=0 units,150-199=4 units, 200-249=6 units,250- 299=10 units, 300-349=12 units, 350-399=15 units, > 400 call MD ( prime pen with 2 units prior  to set dose))  15 mL 0   insulin glargine (LANTUS SOLOSTAR) 100 UNIT/ML Solostar Pen Inject 16 Units into the skin 2 (two) times daily. (Patient taking differently: Inject 45 Units into the skin at bedtime.) 15 mL 0   levothyroxine (SYNTHROID) 75 MCG tablet Take 75 mcg by mouth daily before breakfast.       linagliptin (TRADJENTA) 5 MG TABS tablet Take 1 tablet (5 mg total) by mouth daily. 30  tablet 0   LORazepam (ATIVAN) 0.5 MG tablet Take 1 tablet (0.5 mg total) by mouth at bedtime. 30 tablet 1   midodrine (PROAMATINE) 10 MG tablet Take 10 mg by mouth daily.       Nutritional Supplements (FEEDING SUPPLEMENT, NEPRO CARB STEADY,) LIQD Take 237 mLs by mouth daily. (0900)       ondansetron (ZOFRAN) 4 MG tablet Take 4 mg by mouth every 8 (eight) hours as needed for nausea.       OXYGEN Inhale 2.5 L/min into the lungs at bedtime.       polyethylene glycol (MIRALAX / GLYCOLAX) 17 g packet Take 17 g by mouth 2 (two) times daily. (Patient taking differently: Take 17 g by mouth daily as needed for moderate constipation.) 14 each 0   pregabalin (LYRICA) 150 MG capsule Take 150 mg by mouth 2 (two) times daily.       sevelamer carbonate (RENVELA) 800 MG tablet Take 1,600 mg by mouth 3 (three) times daily with meals. (0800, 1200 & 1700)       [DISCONTINUED] vortioxetine HBr (TRINTELLIX) 20 MG TABS tablet Take 1 tablet (20 mg total) by mouth in the morning. (1000) 30 tablet 5   blood glucose meter kit and supplies Use up to four times daily as directed. 1 each 0   Continuous Blood Gluc Sensor (FREESTYLE LIBRE 2 SENSOR) MISC         Fingerstix Lancets MISC Use as directed to check blood sugars as need up to 4 times daily 100 each 0   Insulin Pen Needle 32G X 4 MM MISC Use as directed 200 each 0   nystatin (MYCOSTATIN) 100000 UNIT/ML suspension Use as directed 5 mLs (500,000 Units total) in the mouth or throat 4 (four) times daily. (Patient not taking: Reported on 11/10/2022) 60 mL 2   pantoprazole (PROTONIX) 40 MG tablet Take 1 tablet (40 mg total) by mouth 2 (two) times daily before a meal. (Patient not taking: Reported on 11/10/2022) 30 tablet 0   [DISCONTINUED] metoCLOPramide (REGLAN) 10 MG tablet Take 1 tablet (10 mg total) by mouth every 6 (six) hours as needed for nausea or vomiting. (Patient not taking: Reported on 11/10/2022) 30 tablet 0   [DISCONTINUED] oxyCODONE-acetaminophen (PERCOCET/ROXICET)  5-325 MG tablet Take 1 tablet by mouth every 6 (six) hours as needed. (Patient not taking: Reported on 11/10/2022) 20 tablet 0   [DISCONTINUED] pregabalin (LYRICA) 75 MG capsule Take 1 capsule (75 mg total) by mouth daily. (Patient not taking: Reported on 11/10/2022) 2 capsule 0   [DISCONTINUED] senna-docusate (SENOKOT-S) 8.6-50 MG tablet Take 2 tablets by mouth 2 (two) times daily. (Patient not taking: Reported on 11/10/2022)              Home: Home Living Family/patient expects to be discharged to:: Private residence Living Arrangements: Spouse/significant other Available Help at Discharge: Family, Available 24 hours/day (spouse is on FMLA, may retire early when [employer] over; can provide 24/7) Type of Home: House Home Access: Ramped entrance Home Layout: One level Bathroom Shower/Tub: Tub/shower unit, Door [employer]  Toilet: Handicapped height Bathroom Accessibility: Yes Home Equipment: Grab bars - tub/shower, Agricultural consultant (2 wheels), Rollator (4 wheels), BSC/3in1, Cane - quad, Information systems manager, Wheelchair - manual Additional Comments: Husband is out on FMLA until Sept. She was scheduled to get a prosthesis for RLE prior to admission  Lives With: Spouse   Functional History: Prior Function Prior Level of Function : Needs assist Physical Assist : Mobility (physical) Mobility Comments: Husband uses hoyer lift to transfer patient to w/c daily. Sleeps in recliner. Uses transport service to get to MD appointments. ADLs Comments: Requires assist for ADLs bed level, pt reports self feeding, grooming, increased difficulty due to B hand neuropathy.  Using brief to have BM, husband provides.   Functional Status:  Mobility: Bed Mobility Overal bed mobility: Needs Assistance Bed Mobility: Rolling Rolling: Supervision, Mod assist Supine to sit: HOB elevated, Min assist Sit to supine: Min guard General bed mobility comments: Performed rolling to L to reposition bed pad in the correct position.  Pt  performed to the R with mod assistance. Transfers Overall transfer level:  (deferred as she felt dizzy after dialysis session.) Equipment used: Ambulation equipment used Transfers: Sit to/from Stand Sit to Stand: Max assist, +2 physical assistance, From elevated surface General transfer comment: Performed sit<>stand with Antony Salmon Max A +2 for the first 2 attempts with cues for technique and upright posture for full hip extenstion to neutral. 3rd attempt Mod assist +2, fear of fallnig forward. Cues for UE use to push upright. Tolerated each stand up to 10 seconds. Was not utilizing knee pad on Lt first two attempts, third attempt tolerated better with this block to assist for leverage. Ambulation/Gait General Gait Details: non ambulatory for quite some time.   ADL: ADL Overall ADL's : Needs assistance/impaired Eating/Feeding: Set up, Bed level Eating/Feeding Details (indicate cue type and reason): difficulty opening packages, drinks, hand weakness/neuropathy Grooming: Set up, Bed level Upper Body Bathing: Moderate assistance Lower Body Bathing: Maximal assistance Upper Body Dressing : Minimal assistance Lower Body Dressing: Maximal assistance Toilet Transfer: Maximal assistance, +2 for physical assistance, +2 for safety/equipment Toilet Transfer Details (indicate cue type and reason): with Stedy Toileting- Clothing Manipulation and Hygiene: Total assistance Functional mobility during ADLs: Maximal assistance, +2 for physical assistance, +2 for safety/equipment General ADL Comments: Pt requires significant assistance for ADLs, able to perform some UB ADLs, hand neuropathy and weak grip limt participation   Cognition: Cognition Overall Cognitive Status: Within Functional Limits for tasks assessed Orientation Level: Oriented X4 Cognition Arousal/Alertness: Awake/alert, Lethargic (from dialysis) Behavior During Therapy: WFL for tasks assessed/performed Overall Cognitive Status: Within  Functional Limits for tasks assessed     Blood pressure (!) 100/41, pulse 67, temperature 97.7 F (36.5 C), temperature source Oral, resp. rate 16, height 5\' 5"  (1.651 m), weight 97.9 kg, SpO2 99 %. Physical Exam Constitutional: No apparent distress. Appropriate appearance for age.  Resting in dialysis bed. HENT: No JVD. Neck Supple. Trachea midline. Atraumatic, normocephalic. Eyes: PERRLA. EOMI. Visual fields grossly intact.  Cardiovascular: RRR, no murmurs/rub/gallops.  Trace edema isolated to right hand.,  2+ bilateral lower extremity edema Respiratory: CTAB. No rales, rhonchi, or wheezing. On 1 L [place].  Abdomen: + bowel sounds, normoactive. No distention or tenderness.  Skin:  + Right BKA in shrinker sock, distal limb appears well-healed, intact + Left calf wound wrapped in Ace bandage, without drainage + Left first toe ulcer, unstageable + Dry, flaking skin     MSK:      + R BKA      +  Active range of motion in right shoulder limited in < 90 degrees flexion, abduction due to prior unrepaired shoulder dislocation      Strength:                RUE: 4/5 elbow flexion, extension, wrist, finger flexion, finger abduction                LUE: 4 out of 5 throughout                 LLE: 1/5 HF, 4/5 KE, 3/5 DF, 3/5 EHL, 4/5 PF                 RLE:  4/5 HF, 4/5 KE   Neurologic exam:  Cognition: AAO to person, place, time and event.  + Concentration difficulty + Mild memory deficit  Language: + Perseveration (for year, states "23 23 23 23  20"; then self corrects)  Names 3/3 objects correctly.  Memory: Recalls 0/3 objects at 5 minutes. Insight: Good insight into current condition.  Mood: Flat affect Sensation: Stocking glove neuropathy pattern to distal thigh and proximal forearms bilaterally Reflexes: 2+ in BL UE and LEs. Negative Hoffman's and babinski signs bilaterally.  CN: 2-12 grossly intact.  Coordination: + Ataxia left upper extremity Spasticity: MAS 0 in all extremities.            Lab Results Last 48 Hours        Results for orders placed or performed during the hospital encounter of 11/10/22 (from the past 48 hour(s))  Glucose, capillary     Status: Abnormal    Collection Time: 11/18/22  7:56 AM  Result Value Ref Range    Glucose-Capillary 196 (H) 70 - 99 mg/dL      Comment: Glucose reference range applies only to samples taken after fasting for at least 8 hours.  Glucose, capillary     Status: Abnormal    Collection Time: 11/18/22  1:37 PM  Result Value Ref Range    Glucose-Capillary 147 (H) 70 - 99 mg/dL      Comment: Glucose reference range applies only to samples taken after fasting for at least 8 hours.  Glucose, capillary     Status: Abnormal    Collection Time: 11/18/22  4:24 PM  Result Value Ref Range    Glucose-Capillary 223 (H) 70 - 99 mg/dL      Comment: Glucose reference range applies only to samples taken after fasting for at least 8 hours.  Glucose, capillary     Status: Abnormal    Collection Time: 11/18/22  8:05 PM  Result Value Ref Range    Glucose-Capillary 228 (H) 70 - 99 mg/dL      Comment: Glucose reference range applies only to samples taken after fasting for at least 8 hours.  Renal function panel     Status: Abnormal    Collection Time: 11/19/22  2:05 AM  Result Value Ref Range    Sodium 131 (L) 135 - 145 mmol/L    Potassium 3.9 3.5 - 5.1 mmol/L    Chloride 95 (L) 98 - 111 mmol/L    CO2 25 22 - 32 mmol/L    Glucose, Bld 207 (H) 70 - 99 mg/dL      Comment: Glucose reference range applies only to samples taken after fasting for at least 8 hours.    BUN 42 (H) 8 - 23 mg/dL    Creatinine, Ser 1.61 (H) 0.44 - 1.00 mg/dL    Calcium 8.4 (L)  8.9 - 10.3 mg/dL    Phosphorus 4.6 2.5 - 4.6 mg/dL    Albumin 2.5 (L) 3.5 - 5.0 g/dL    GFR, Estimated 11 (L) >60 mL/min      Comment: (NOTE) Calculated using the CKD-EPI Creatinine Equation (2021)      Anion gap 11 5 - 15      Comment: Performed at [facility], [address] [place], [place] [address]  CBC     Status: Abnormal    Collection Time: 11/19/22  2:05 AM  Result Value Ref Range    WBC 4.8 4.0 - 10.5 K/uL    RBC 3.47 (L) 3.87 - 5.11 MIL/uL    Hemoglobin 8.4 (L) 12.0 - 15.0 g/dL    HCT 66.4 (L) 40.3 - 46.0 %    MCV 80.7 80.0 - 100.0 fL    MCH 24.2 (L) 26.0 - 34.0 pg    MCHC 30.0 30.0 - 36.0 g/dL    RDW 47.4 (H) 25.9 - 15.5 %    Platelets 174 150 - 400 K/uL    nRBC 0.0 0.0 - 0.2 %      Comment: Performed at [facility], [address], [place], [place] [address]  Glucose, capillary     Status: Abnormal    Collection Time: 11/19/22  2:57 AM  Result Value Ref Range    Glucose-Capillary 200 (H) 70 - 99 mg/dL      Comment: Glucose reference range applies only to samples taken after fasting for at least 8 hours.  Glucose, capillary     Status: Abnormal    Collection Time: 11/19/22 12:44 PM  Result Value Ref Range    Glucose-Capillary 149 (H) 70 - 99 mg/dL      Comment: Glucose reference range applies only to samples taken after fasting for at least 8 hours.  Blood gas, venous     Status: Abnormal    Collection Time: 11/19/22  3:47 PM  Result Value Ref Range    pH, Ven 7.37 7.25 - 7.43    pCO2, Ven 58 44 - 60 mmHg    pO2, Ven 31 (LL) 32 - 45 mmHg      Comment: CRITICAL RESULT CALLED TO, READ BACK BY AND VERIFIED WITH: C. SHERRON, RN AT 1612 06.26.24 D. BLU      Bicarbonate 33.5 (H) 20.0 - 28.0 mmol/L    Acid-Base Excess 7.0 (H) 0.0 - 2.0 mmol/L    O2 Saturation 52.1 %    Patient temperature 37.1      Drawn by [id number]        Comment: Performed at [facility], [address], [place], [place] [address]  Glucose, capillary     Status: Abnormal    Collection Time: 11/19/22  4:04 PM  Result Value Ref Range    Glucose-Capillary 108 (H) 70 - 99 mg/dL      Comment: Glucose reference range applies only to samples taken after fasting for at least 8 hours.  Glucose, capillary     Status: Abnormal    Collection Time: 11/19/22  8:38 PM   Result Value Ref Range    Glucose-Capillary 243 (H) 70 - 99 mg/dL      Comment: Glucose reference range applies only to samples taken after fasting for at least 8 hours.  Basic metabolic panel     Status: Abnormal    Collection Time: 11/20/22  1:16 AM  Result Value Ref Range    Sodium  132 (L) 135 - 145 mmol/L    Potassium 4.2 3.5 - 5.1 mmol/L    Chloride 95 (L) 98 - 111 mmol/L    CO2 26 22 - 32 mmol/L    Glucose, Bld 330 (H) 70 - 99 mg/dL      Comment: Glucose reference range applies only to samples taken after fasting for at least 8 hours.    BUN 35 (H) 8 - 23 mg/dL    Creatinine, Ser 9.60 (H) 0.44 - 1.00 mg/dL    Calcium 8.9 8.9 - 45.4 mg/dL    GFR, Estimated 13 (L) >60 mL/min      Comment: (NOTE) Calculated using the CKD-EPI Creatinine Equation (2021)      Anion gap 11 5 - 15      Comment: Performed at [facility], [address], [place], [place] [address]  CBC     Status: Abnormal    Collection Time: 11/20/22  1:16 AM  Result Value Ref Range    WBC 5.9 4.0 - 10.5 K/uL    RBC 3.46 (L) 3.87 - 5.11 MIL/uL    Hemoglobin 8.5 (L) 12.0 - 15.0 g/dL    HCT 91.4 (L) 78.2 - 46.0 %    MCV 81.5 80.0 - 100.0 fL    MCH 24.6 (L) 26.0 - 34.0 pg    MCHC 30.1 30.0 - 36.0 g/dL    RDW 95.6 (H) 21.3 - 15.5 %    Platelets 187 150 - 400 K/uL    nRBC 0.0 0.0 - 0.2 %      Comment: Performed at [facility], [address] [place], [place] [address]  Magnesium     Status: None    Collection Time: 11/20/22  1:16 AM  Result Value Ref Range    Magnesium 1.9 1.7 - 2.4 mg/dL      Comment: Performed at [facility], [address] [place], [place] [address]  Lactic acid, plasma     Status: None    Collection Time: 11/20/22  1:16 AM  Result Value Ref Range    Lactic Acid, Venous 0.9 0.5 - 1.9 mmol/L      Comment: Performed at [facility], [address] [place], [place] [address]  Glucose, capillary     Status: Abnormal    Collection Time: 11/20/22  4:44 AM  Result  Value Ref Range    Glucose-Capillary 252 (H) 70 - 99 mg/dL      Comment: Glucose reference range applies only to samples taken after fasting for at least 8 hours.    *Note: Due to a large number of results and/or encounters for the requested time period, some results have not been displayed. A complete set of results can be found in Results Review.      Imaging Results (Last 48 hours)  No results found.         Blood pressure (!) 100/41, pulse 67, temperature 97.7 F (36.5 C), temperature source Oral, resp. rate 16, height 5\' 5"  (1.651 m), weight 97.9 kg, SpO2 99 %.   Medical Problem List and Plan: 1. Functional deficits secondary to right BKA revision 1/24 and left leg wound             -patient may shower with wound covered             -ELOS/Goals: 10-14 days, min to mod assist PT/OT  -Was preparing for initial prosthetic fitting with [facility] prior to admission, they  are aware she is coming to rehab, would coordinate trial of prosthetic device while at inpatient rehab  2.  Antithrombotics: -DVT/anticoagulation:  Pharmaceutical: Eliquis             -antiplatelet therapy: N/A 3. Pain Management:  Oxycodone prn.  4. Mood/Behavior/Sleep: LCSW to follow for evaluation and support.              --chronic insomnia--has Ambien prn w/Ramelteron and Lorazepam.             -antipsychotic agents: N/A 5. Neuropsych/cognition: This patient is capable of making decisions on her own behalf. 6. Skin/Wound Care: Monitor wound for healing.  --Wound VAC d/c due to concerns of wound getting macerated per Dr. Lajoyce Corners.  --Dry dressing changes and to add silvadene to wound bed if wound starts getting dry.  7. Fluids/Electrolytes/Nutrition:  Strict I/O with 1200 cc FR. Monitor intake 8. Kleb/Proteus/Enterococcus wound infection: Cefazolin for 2 weeks with EOT 11/29/22 per Dr. Thedore Mins 9.  T2DM: Hgb A1c-             --Monitor BS ac/hs and use SSI for elevated BS. Titrate basal insulin as indicated.  10.  CAD/biventricular CHF: On GDMT limited due to hypotension.   --Continue Lipitor.   11. ESRD: HD TTS at the end of the day to help with tolerance of therapy.  Thrombosed AVF not salvageable.                --has been on regular diet-->? Low salt restrictions.  12. Depression with anxiety d/o/panic attacks: Now on Venlafaxine XR with Lorazepam at nighs .  13. PAF: On Eliquis and amiodarone for rate control.  14. Anemia of chronic disease: 15.   H/o GIB due to stercoral ulcer: Avoid constipation.      Mcarthur Rossetti Angiulli, PA-C 11/20/2022  I have examined the patient independently and edited the note for HPI, ROS, exam, assessment, and plan as appropriate. I am in agreement with the above recommendations.   Angelina Sheriff, DO 11/20/2022

## 2022-11-20 NOTE — Progress Notes (Signed)
Transport TJ called and said pt wants to eat I spoke with pt to offer a breakfast sandwhich. Pt declined breakfast sandwich. Tx delayed due to pt wanting to eat breakfast tray.

## 2022-11-20 NOTE — Progress Notes (Signed)
    Ranelle Oyster, MD  Physician Physical Medicine and Rehabilitation   Progress Notes     Signed   Date of Service: 11/14/2022  1:22 PM  Related encounter: ED to Hosp-Admission (Current) from 11/10/2022 in [facility]   Signed      Show:Clear all [x] Written[x] Templated[] Copied  Added by: [x] Ranelle Oyster, MD  [] Hover for details [facility]  CONSULT SERVICE NOTE    Chart reviewed, discussed with admissions coordinator. 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 Pt underwent a debridement of the area with application of wound vac on 6/21 by Dr. Lajoyce Corners. Pt hasn't walked for several months at least and apparently received her right BK prosthesis recently but has yet to use. Pt has been completely sedentary, and her husband has been hoyer lifting her to the recliner each day.    For her to be a candidate for inpatient rehab, she needs to demonstrate more capacity to participate in therapy, and potential functional goals need to be made clear for patient and her husband. I would suspect something such as min to mod assist goals for transfers/wheel chair mobility and self-care might be manageable at home. If patient was able to utilize her new prosthesis somewhat for a pivot-type transfer, this might aid her cause also.    Rehab Admissions Coordinator to follow up    Ranelle Oyster, MD, [facility] Physical Medicine & Rehabilitation Medical Director Rehabilitation Services 11/14/2022

## 2022-11-20 NOTE — Progress Notes (Signed)
OT Cancellation Note  Patient Details Name: Susan [name] MRN: [id number] DOB: 11/09/1950   Cancelled Treatment:    Reason Eval/Treat Not Completed: Patient declined, no reason specified (Pt reporting too much pain in stomach and is having cramps with constipation. She was positioned on bed pan before entry, RN notifed that pt is calling for her.)  11/20/2022  AB, OTR/L  Acute Rehabilitation Services  Office: [phone]   Tristan Schroeder 11/20/2022, 4:22 PM

## 2022-11-21 DIAGNOSIS — I480 Paroxysmal atrial fibrillation: Secondary | ICD-10-CM

## 2022-11-21 DIAGNOSIS — Z8719 Personal history of other diseases of the digestive system: Secondary | ICD-10-CM

## 2022-11-21 DIAGNOSIS — Z992 Dependence on renal dialysis: Secondary | ICD-10-CM

## 2022-11-21 DIAGNOSIS — N186 End stage renal disease: Secondary | ICD-10-CM

## 2022-11-21 DIAGNOSIS — E1165 Type 2 diabetes mellitus with hyperglycemia: Secondary | ICD-10-CM

## 2022-11-21 DIAGNOSIS — Z89511 Acquired absence of right leg below knee: Secondary | ICD-10-CM | POA: Diagnosis not present

## 2022-11-21 DIAGNOSIS — Z794 Long term (current) use of insulin: Secondary | ICD-10-CM

## 2022-11-21 DIAGNOSIS — D638 Anemia in other chronic diseases classified elsewhere: Secondary | ICD-10-CM

## 2022-11-21 LAB — GLUCOSE, CAPILLARY
Glucose-Capillary: 156 mg/dL — ABNORMAL HIGH (ref 70–99)
Glucose-Capillary: 212 mg/dL — ABNORMAL HIGH (ref 70–99)
Glucose-Capillary: 267 mg/dL — ABNORMAL HIGH (ref 70–99)
Glucose-Capillary: 250 mg/dL — ABNORMAL HIGH (ref 70–99)

## 2022-11-21 LAB — HEPATITIS B SURFACE ANTIGEN: Hepatitis B Surface Ag: NONREACTIVE

## 2022-11-21 MED ORDER — CEFAZOLIN SODIUM-DEXTROSE 2-4 GM/100ML-% IV SOLN
2.0000 g | INTRAVENOUS | Status: AC
  Administered 2022-11-22 – 2022-11-27 (×3): 2 g via INTRAVENOUS
  Filled 2022-11-21 (×3): qty 100

## 2022-11-21 MED ORDER — INSULIN GLARGINE-YFGN 100 UNIT/ML ~~LOC~~ SOLN
22.0000 [IU] | Freq: Every day | SUBCUTANEOUS | Status: DC
  Administered 2022-11-21 – 2022-11-26 (×6): 22 [IU] via SUBCUTANEOUS
  Filled 2022-11-21 (×7): qty 0.22

## 2022-11-21 MED ORDER — CHLORHEXIDINE GLUCONATE CLOTH 2 % EX PADS: 6.0000 | MEDICATED_PAD | Freq: Every day | CUTANEOUS | Status: DC

## 2022-11-21 MED ORDER — CHLORHEXIDINE GLUCONATE CLOTH 2 % EX PADS
6.0000 | MEDICATED_PAD | Freq: Two times a day (BID) | CUTANEOUS | Status: DC
  Administered 2022-11-21 – 2022-12-12 (×40): 6 via TOPICAL

## 2022-11-21 MED ORDER — SILVER SULFADIAZINE 1 % EX CREA
TOPICAL_CREAM | Freq: Every day | CUTANEOUS | Status: DC
  Filled 2022-11-21: qty 85

## 2022-11-21 NOTE — Discharge Instructions (Addendum)
Inpatient Rehab Discharge Instructions  Rachell Rund [facility] Discharge date and time: No discharge date for patient encounter.   Activities/Precautions/ Functional Status: Activity: activity as tolerated Diet: 1200 mL fluid restriction Wound Care: Routine skin checks Functional status:  ___ No restrictions     ___ Walk up steps independently ___ 24/7 supervision/assistance   ___ Walk up steps with assistance ___ Intermittent supervision/assistance  ___ Bathe/dress independently ___ Walk with walker     _x__ Bathe/dress with assistance ___ Walk Independently    ___ Shower independently ___ Walk with assistance    ___ Shower with assistance ___ No alcohol     ___ Return to work/school ________  Special Instructions: No driving smoking or alcohol  Continue hemodialysis as directed  Wound care to sacrum.  Cleanse with normal saline and pat dry.  Apply Xeroform gauze to open areas, cover with dry gauze and secure with silicone foam dressing.  Change Xeroform daily, may reuse silicone foam for up to 3 days.  Change as needed soiling     COMMUNITY REFERRALS UPON DISCHARGE:    Home Health:   PT   OT   RN                 Agency:[facility] Phone: [phone]   Medical Equipment/Items Ordered: DROP-ARM BED SIDE COMMODE AND 30 TRANSFER BOARD-HAS ALL OTHER NEEDED EQUIPMENT FROM PAST ADMISSIONS                                                 Agency/Supplier:[facility]   [phone]    My questions have been answered and I understand these instructions. I will adhere to these goals and the provided educational materials after my discharge from the hospital.  Patient/Caregiver Signature _______________________________ Date __________  Clinician Signature _______________________________________ Date __________  Please bring this form and your medication list with you to all your follow-up doctor's appointments.

## 2022-11-21 NOTE — Progress Notes (Signed)
Inpatient Rehabilitation Care Coordinator Assessment and Plan Patient Details  Name: Susan [name] MRN: [id number] Date of Birth: [birth year]  Today's Date: 11/21/2022  Hospital Problems: Principal Problem:   Right below-knee amputee [facility])  Past Medical History:  Past Medical History:  Diagnosis Date   Anemia    hx   Anxiety    Arthritis    "generalized" (03/15/2014)   CAD (coronary artery disease)    MI in 2000 - MI  2007 - treated bare metal stent (no nuclear since then as 9/11)   Carotid artery disease (HCC)    Chronic diastolic heart failure (HCC)    a) ECHO (08/2013) EF 55-60% and RV function nl b) RHC (08/2013) RA 4, RV 30/5/7, PA 25/10 (16), PCWP 7, Fick CO/CI 6.3/2.7, PVR 1.5 WU, PA 61 and 66%   Daily headache    "~ every other day; since I fell in June" (03/15/2014)   Depression    Diabetic retinopathy (HCC)    Dyslipidemia    ESRD (end stage renal disease) (HCC)    Dialysis on Tues Thurs Sat   Exertional shortness of breath    History of kidney stones    HTN (hypertension)    Hypothyroidism    Obesity    Osteoarthritis    PAF (paroxysmal atrial fibrillation) (HCC)    Peripheral neuropathy    bilateral feet/hands   PONV (postoperative nausea and vomiting)    RBBB (right bundle branch block)    Old   Stroke (HCC)    mini strokes   Type II diabetes mellitus (HCC)    Type II, [name]. patient has omnipod insulin pump with Novolin R Insulin   Past Surgical History:  Past Surgical History:  Procedure Laterality Date   A/V FISTULAGRAM Left 11/07/2022   Procedure: A/V Fistulagram;  Surgeon: Leonie Douglas, MD;  [facility] INV[facility];  Service: Cardiovascular;  Laterality: Left;   ABDOMINAL HYSTERECTOMY  1980's   AMPUTATION Right 02/24/2018   Procedure: RIGHT FOOT GREAT TOE AND 2ND TOE AMPUTATION;  Surgeon: Nadara Mustard, MD;  Location: [facility] OR;  Service: Orthopedics;  Laterality: Right;   AMPUTATION Right 04/30/2018   Procedure: RIGHT  TRANSMETATARSAL AMPUTATION;  Surgeon: Nadara Mustard, MD;  Location: [facility] OR;  Service: Orthopedics;  Laterality: Right;   AMPUTATION Right 05/02/2022   Procedure: RIGHT BELOW KNEE AMPUTATION;  Surgeon: Nadara Mustard, MD;  Location: [facility] OR;  Service: Orthopedics;  Laterality: Right;   APPLICATION OF WOUND VAC Right 06/13/2022   Procedure: APPLICATION OF WOUND VAC;  Surgeon: Nadara Mustard, MD;  [facility]: [facility] OR;  Service: Orthopedics;  Laterality: Right;   APPLICATION OF WOUND VAC Left 11/14/2022   Procedure: APPLICATION OF WOUND VAC;  Surgeon: Nadara Mustard, MD;  [facility] OR;  Service: Orthopedics;  Laterality: Left;   AV FISTULA PLACEMENT Left 04/02/2022   Procedure: LEFT ARM ARTERIOVENOUS (AV) FISTULA CREATION;  Surgeon: Nada Libman, MD;  [facility]: [facility] OR;  Service: Vascular;  Laterality: Left;  PERIPHERAL NERVE BLOCK   BASCILIC VEIN TRANSPOSITION Left 07/31/2022   Procedure: LEFT ARM SECOND STAGE BASILIC VEIN TRANSPOSITION;  Surgeon: Nada Libman, MD;  [facility] OR;  Service: Vascular;  Laterality: Left;   BIOPSY  05/27/2020   Procedure: BIOPSY;  Surgeon: Lanelle Bal, DO;  [facility]: [facility]O [facility];  Service: Endoscopy;;   CATARACT EXTRACTION, BILATERAL Bilateral ?2013   COLONOSCOPY W/ POLYPECTOMY     COLONOSCOPY WITH PROPOFOL N/A 03/13/2019  Procedure: COLONOSCOPY WITH PROPOFOL;  Surgeon: Beverley Fiedler, MD;  Location: [facility] ENDOSCOPY;  Service: Gastroenterology;  Laterality: N/A;   CORONARY ANGIOPLASTY WITH STENT PLACEMENT  1999; 2007   "1 + 1"   ERCP N/A 02/03/2022   Procedure: ENDOSCOPIC RETROGRADE CHOLANGIOPANCREATOGRAPHY (ERCP);  Surgeon: Jeani Hawking, MD;  Location: [facility] ENDOSCOPY;  Service: Gastroenterology;  Laterality: N/A;   ESOPHAGOGASTRODUODENOSCOPY (EGD) WITH PROPOFOL N/A 03/13/2019   Procedure: ESOPHAGOGASTRODUODENOSCOPY (EGD) WITH PROPOFOL;  Surgeon: Beverley Fiedler, MD;  Location: [facility] ENDOSCOPY;  Service: Gastroenterology;  Laterality: N/A;   ESOPHAGOGASTRODUODENOSCOPY  (EGD) WITH PROPOFOL N/A 05/27/2020   Procedure: ESOPHAGOGASTRODUODENOSCOPY (EGD) WITH PROPOFOL;  Surgeon: Lanelle Bal, DO;  [facility];  Service: Endoscopy;  Laterality: N/A;   ESOPHAGOGASTRODUODENOSCOPY (EGD) WITH PROPOFOL N/A 09/03/2022   Procedure: ESOPHAGOGASTRODUODENOSCOPY (EGD) WITH PROPOFOL;  Surgeon: Jeani Hawking, MD;  Location: [facility] ENDOSCOPY;  Service: Gastroenterology;  Laterality: N/A;   EYE SURGERY Bilateral    lazer   FLEXIBLE SIGMOIDOSCOPY N/A 05/23/2022   Procedure: FLEXIBLE SIGMOIDOSCOPY;  Surgeon: Jeani Hawking, MD;  Location: [facility] ENDOSCOPY;  Service: Gastroenterology;  Laterality: N/A;   FLEXIBLE SIGMOIDOSCOPY N/A 05/24/2022   Procedure: FLEXIBLE SIGMOIDOSCOPY;  Surgeon: Imogene Burn, MD;  Location: [facility] ENDOSCOPY;  Service: Gastroenterology;  Laterality: N/A;   FLEXIBLE SIGMOIDOSCOPY N/A 09/03/2022   Procedure: FLEXIBLE SIGMOIDOSCOPY;  Surgeon: Jeani Hawking, MD;  Location: [facility] ENDOSCOPY;  Service: Gastroenterology;  Laterality: N/A;   HEMOSTASIS CLIP PLACEMENT  03/13/2019   Procedure: HEMOSTASIS CLIP PLACEMENT;  Surgeon: Beverley Fiedler, MD;  Location: [facility] ENDOSCOPY;  Service: Gastroenterology;;   HEMOSTASIS CLIP PLACEMENT  05/23/2022   Procedure: HEMOSTASIS CLIP PLACEMENT;  Surgeon: Jeani Hawking, MD;  Location: [facility] ENDOSCOPY;  Service: Gastroenterology;;   HEMOSTASIS CONTROL  05/24/2022   Procedure: HEMOSTASIS CONTROL;  Surgeon: Imogene Burn, MD;  Location: [facility] ENDOSCOPY;  Service: Gastroenterology;;   HOT HEMOSTASIS N/A 05/23/2022   Procedure: HOT HEMOSTASIS (ARGON PLASMA COAGULATION/BICAP);  Surgeon: Jeani Hawking, MD;  Location: [facility] ENDOSCOPY;  Service: Gastroenterology;  Laterality: N/A;   I & D EXTREMITY Left 05/05/2022   Procedure: IRRIGATION AND DEBRIDEMENT LEFT ARM AV FISTULA;  Surgeon: Cephus Shelling, MD;  Location: [facility] OR;  Service: Vascular;  Laterality: Left;   I & D EXTREMITY N/A 11/14/2022   Procedure: IRRIGATION AND DEBRIDEMENT OF LOWER  EXTREMITY WOUND;  Surgeon: Nadara Mustard, MD;  [facility] OR;  Service: Orthopedics;  Laterality: N/A;   INSERTION OF DIALYSIS CATHETER Right 04/02/2022   Procedure: INSERTION OF TUNNELED DIALYSIS CATHETER;  Surgeon: Nada Libman, MD;  Location: [facility] OR;  Service: Vascular;  Laterality: Right;   KNEE ARTHROSCOPY Left 10/25/2006   POLYPECTOMY  03/13/2019   Procedure: POLYPECTOMY;  Surgeon: Beverley Fiedler, MD;  [facility] ENDOS[facility];  Service: Gastroenterology;;   REMOVAL OF STONES  02/03/2022   Procedure: REMOVAL OF STONES;  Surgeon: Jeani Hawking, MD;  Location: [facility] ENDOSCOPY;  Service: Gastroenterology;;   REVISON OF ARTERIOVENOUS FISTULA Left 08/20/2022   Procedure: REVISON OF LEFT ARM ARTERIOVENOUS FISTULA;  Surgeon: Nada Libman, MD;  [facility];  Service: Vascular;  Laterality: Left;   RIGHT HEART CATH N/A 07/24/2017   Procedure: RIGHT HEART CATH;  Surgeon: Dolores Patty, MD;  [facility];  Service: Cardiovascular;  Laterality: N/A;   RIGHT HEART CATHETERIZATION N/A 09/22/2013   Procedure: RIGHT HEART CATH;  Surgeon: Dolores Patty, MD;  Location: [facility] CATH LAB;  Service: Cardiovascular;  Laterality: N/A;   SHOULDER ARTHROSCOPY  WITH OPEN ROTATOR CUFF REPAIR Right 03/14/2014   Procedure: RIGHT SHOULDER ARTHROSCOPY WITH BICEPS RELEASE, OPEN SUBSCAPULA REPAIR, OPEN SUPRASPINATUS REPAIR.;  Surgeon: Cammy Copa, MD;  Location: [facility] OR;  Service: Orthopedics;  Laterality: Right;   SPHINCTEROTOMY  02/03/2022   Procedure: SPHINCTEROTOMY;  Surgeon: Jeani Hawking, MD;  Location: [facility] ENDOSCOPY;  Service: Gastroenterology;;   STUMP REVISION Right 06/13/2022   Procedure: REVISION RIGHT BELOW KNEE AMPUTATION;  Surgeon: Nadara Mustard, MD;  Location: [facility] OR;  Service: Orthopedics;  Laterality: Right;   TEE WITHOUT CARDIOVERSION N/A 02/04/2022   Procedure: TRANSESOPHAGEAL ECHOCARDIOGRAM (TEE);  Surgeon: Dolores Patty, MD;  Location: [facility] ENDOSCOPY;  Service:  Cardiovascular;  Laterality: N/A;   THROMBECTOMY W/ EMBOLECTOMY Left 08/20/2022   Procedure: THROMBECTOMY OF LEFT ARM ARTERIOVENOUS FISTULA;  Surgeon: Nada Libman, MD;  [facility];  Service: Vascular;  Laterality: Left;   TOE AMPUTATION Right 02/24/2018   GREAT TOE AND 2ND TOE AMPUTATION   TUBAL LIGATION  1970's   Social History:  reports that she quit smoking about 25 years ago. Her smoking use included cigarettes. She has a 96.00 pack-year smoking history. She has never used smokeless tobacco. She reports that she does not currently use alcohol. She reports that she does not use drugs.  Family / Support Systems Marital Status: Married Patient Roles: Spouse, Parent Spouse/Significant Other: Edward-Joe-[phone] Children: [name]-daughter [phone]  Disabled son stays between pt and husband and his sister Other Supports: friends Anticipated Caregiver: Joe Ability/Limitations of Caregiver: Was doing mod assist via hoyer since being dischsrged from the SNF on 4/30. Joe on FMLA to be there with her ends 01/2023 Caregiver Availability: 24/7 Family Dynamics: Pt reports her daughter is too busy with trapping cats to come and help her, her husband is unrealisitc and thinks will be able to walk once has prothesis on and their son has been disabled since failed suicide attempt 10 years ago and goes between sister and their home. He needs supervision due to memory issues.  Social History Preferred language: English Religion: Non-Denominational Cultural Background: No issues Education: HS Health Literacy - How often do you need to have someone help you when you read instructions, pamphlets, or other written material from your doctor or pharmacy?: Never Writes: Yes Employment Status: Retired Marine scientist Issues: No issues Guardian/Conservator: None-according to MD pt is capable of making her own decisions while here. Husband has gone back to work and is working now    Abuse/Neglect Abuse/Neglect Assessment Can Be Completed: Yes Physical Abuse: Denies Verbal Abuse: Denies Sexual Abuse: Denies Exploitation of patient/patient's resources: Denies Self-Neglect: Denies  Patient response to: Social Isolation - How often do you feel lonely or isolated from those around you?: Never  Emotional Status Pt's affect, behavior and adjustment status: Pt is motivared to do waht she can but has pain issues and swelling in her amputation and leg with wounds. She wants to be able to transfer in and out of a car since they are spending most of their money on private [name] transport to appointments and OP-HD. has been though a lot since all started in jan 2024 Recent Psychosocial Issues: other health issues-financial issues, care needs etc Psychiatric History: History of anxiety trakes medications for this but would benefit from seeing neuro-psych for coping has been through a great deal snce start of the year. Substance Abuse History: No issues  Patient / Family Perceptions, Expectations & Goals Pt/Family understanding of illness & functional limitations: Pt is able to explain  her issues-leg wounds, amputation and multiple medical issues. She does talk with the MD and feels she understands the plan now. Feels she needs time to heal and get her muscle strength back since it has been a long time in a bed. Premorbid pt/family roles/activities: wife, mom, retire, hemodialysis pt, etc Anticipated changes in roles/activities/participation: resume Pt/family expectations/goals: Pt states: " I want to do more but my husband thinks once this leg is placed on I will be able to walk."  [employer]: Other (Comment) (was at [facility] until 4/30-100 days) Premorbid Home Care/DME Agencies: Other (Comment) (has hoyer lift manual hospital bed, wc,) Transportation available at discharge: Private [name] transport has not ben able to get in and out of a car Is the  patient able to respond to transportation needs?: Yes In the past 12 months, has lack of transportation kept you from medical appointments or from getting medications?: No In the past 12 months, has lack of transportation kept you from meetings, work, or from getting things needed for daily living?: No Resource referrals recommended: Neuropsychology  Discharge Planning Living Arrangements: Spouse/significant other Support Systems: Spouse/significant other, Children, Friends/neighbors Type of Residence: Private residence Community education officer Resources: Media planner (specify) ([employer]) Financial Resources: Social Security, Family Support Financial Screen Referred: No Living Expenses: Own Money Management: Patient, Spouse Does the patient have any problems obtaining your medications?: No Home Management: husband Patient/Family Preliminary Plans: Return home with husband who will be ther with his FMLA went back to work now due to pt is in the hospital. Aware being evaluated today and goals being set for stay here. Care Coordinator Barriers to Discharge: Insurance for SNF coverage, Decreased caregiver support Care Coordinator Anticipated Follow Up Needs: HH/OP  Clinical Impression Pleasant female who has been through much since 05/2022 she is willing to try but is very deconditioned from being in a SNF and home in a wheelchair for months. Her husband is her main caregiver and has gone back to work due to limited finances. His FMLA ends 01/2023 and may have to retire after that. Their daughter does not assist and their son who stays with them two days a week needs to be cued due to memory issues. Have placed on the neuro-psych list to be seen for coping and will work on discharge needs.   Lucy Chris 11/21/2022, 10:54 AM

## 2022-11-21 NOTE — Progress Notes (Signed)
PROGRESS NOTE   Subjective/Complaints: Patient seen at bedside this morning.  She reports she has pain in her right leg, however it is tolerable and she does not want any pain medications at this time.  ROS: Denies fever or chills, chest pain, shortness of breath, nausea, vomiting, new changes in motor or sensory function  Objective:   No results found. Recent Labs    11/19/22 0205 11/20/22 0116  WBC 4.8 5.9  HGB 8.4* 8.5*  HCT 28.0* 28.2*  PLT 174 187   Recent Labs    11/19/22 0205 11/20/22 0116  NA 131* 132*  K 3.9 4.2  CL 95* 95*  CO2 25 26  GLUCOSE 207* 330*  BUN 42* 35*  CREATININE 3.97* 3.63*  CALCIUM 8.4* 8.9    Intake/Output Summary (Last 24 hours) at 11/21/2022 1610 Last data filed at 11/21/2022 0825 Gross per 24 hour  Intake 480 ml  Output --  Net 480 ml     Pressure Injury 05/23/22 Buttocks Right Deep Tissue Pressure Injury - Purple or maroon localized area of discolored intact skin or blood-filled blister due to damage of underlying soft tissue from pressure and/or shear. (Active)  05/23/22 1715  Location: Buttocks  Location Orientation: Right  Staging: Deep Tissue Pressure Injury - Purple or maroon localized area of discolored intact skin or blood-filled blister due to damage of underlying soft tissue from pressure and/or shear.  Wound Description (Comments):   Present on Admission: Yes     Pressure Injury 08/27/22 Coccyx Mid Stage 2 -  Partial thickness loss of dermis presenting as a shallow open injury with a red, pink wound bed without slough. 63mmx5mmx0 red center unattached edges (Active)  08/27/22 1000  Location: Coccyx  Location Orientation: Mid  Staging: Stage 2 -  Partial thickness loss of dermis presenting as a shallow open injury with a red, pink wound bed without slough.  Wound Description (Comments): 60mmx5mmx0 red center unattached edges  Present on Admission:     Physical  Exam: Vital Signs Blood pressure (!) 110/57, pulse 62, temperature 98 F (36.7 C), temperature source Oral, resp. rate 16, height 5\' 5"  (1.651 m), weight 99.2 kg, SpO2 100 %.  Physical Exam Constitutional: No apparent distress.  Sitting in bed, obese HENT: [place], AT, MMM Eyes: PERRLA. EOMI. Visual fields grossly intact.  Cardiovascular: RRR,   2+ bilateral lower extremity edema Respiratory: CTAB.  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 + Left calf wound wrapped in Ace bandage, without drainage   MSK: No abnormal tone noted      + Active range of motion in right shoulder limited in < 90 degrees flexion, abduction due to prior unrepaired shoulder dislocation      Strength:                RUE: 4/5 elbow flexion, extension, wrist, finger flexion, finger abduction                LUE: 4 out of 5 throughout                  LLE: 1/5 HF, 4/5  KE, 3/5 DF, 3/5 EHL, 4/5 PF                 RLE:  4/5 HF, 4/5 KE    Neurologic exam: Alert and awake, decreased sensation below distal thighs, cranial nerves II through XII grossly intact Right IJ TDC in place  Assessment/Plan: 1. Functional deficits which require 3+ hours per day of interdisciplinary therapy in a comprehensive inpatient rehab setting. Physiatrist is providing close team supervision and 24 hour management of active medical problems listed below. Physiatrist and rehab team continue to assess barriers to discharge/monitor patient progress toward functional and medical goals  Care Tool:  Bathing              Bathing assist       Upper Body Dressing/Undressing Upper body dressing        Upper body assist      Lower Body Dressing/Undressing Lower body dressing            Lower body assist       Toileting Toileting    Toileting assist       Transfers Chair/bed transfer  Transfers assist            Locomotion Ambulation   Ambulation assist              Walk 10 feet activity   Assist           Walk 50 feet activity   Assist           Walk 150 feet activity   Assist           Walk 10 feet on uneven surface  activity   Assist           Wheelchair     Assist               Wheelchair 50 feet with 2 turns activity    Assist            Wheelchair 150 feet activity     Assist          Blood pressure (!) 110/57, pulse 62, temperature 98 F (36.7 C), temperature source Oral, resp. rate 16, height 5\' 5"  (1.651 m), weight 99.2 kg, SpO2 100 %.  Medical Problem List and Plan: 1. Functional deficits secondary to right BKA revision 1/24 and left leg wound             -patient may shower with wound covered             -ELOS/Goals: 10-14 days, min to mod assist PT/OT             -Was preparing for initial prosthetic fitting with [facility] prior to admission, they are aware she is coming to rehab, would coordinate trial of prosthetic device while at inpatient rehab   -Continue CIR 2.  Antithrombotics: -DVT/anticoagulation:  Pharmaceutical: Eliquis             -antiplatelet therapy: N/A 3. Pain Management:  Oxycodone prn.   -Patient had altered mental status and twitching suspected due to Lyrica toxicity prior to admission to CIR.  Lyrica was discontinued. 4. Mood/Behavior/Sleep: LCSW to follow for evaluation and support.              --chronic insomnia--has Ambien prn w/Ramelteron and Lorazepam.             -antipsychotic agents: N/A 5. Neuropsych/cognition: This patient is capable of making decisions on her own behalf. 6.  Skin/Wound Care: Monitor wound for healing.  --Wound VAC d/c due to concerns of wound getting macerated per Dr. Lajoyce Corners.  --Dry dressing changes and to add silvadene to wound bed if wound starts getting dry.  -6/28 VAC was removed last night Dr. Lajoyce Corners advises to "Apply small amount of silvadine pulse gauze and  ace wrap. Change daily " 7. Fluids/Electrolytes/Nutrition:  Strict I/O with 1200 cc FR. Monitor intake 8. Kleb/Proteus/Enterococcus wound infection: Cefazolin for 2 weeks with EOT 11/29/22 per Dr. Thedore Mins 9.  T2DM: Hgb A1c-7.9 08/27/22             --Monitor BS ac/hs and use SSI for elevated BS. Titrate basal insulin as indicated.   -6/28 she is on Semglee 40 units in the morning and 20 units at bedtime.  Increase bedtime Semglee to 22 units  -CBG (last 3)  Recent Labs    11/21/22 0621 11/21/22 1132 11/21/22 1631  GLUCAP 156* 212* 267*    10. CAD/biventricular CHF: On GDMT limited due to hypotension.   --Continue Lipitor.   11. ESRD: HD TTS at the end of the day to help with tolerance of therapy.  Thrombosed AVF not salvageable.                --has been on regular diet-->? Low salt restrictions.   -6/28 neurology following appreciate assistance, next HD tomorrow 12. Depression with anxiety d/o/panic attacks: Now on Venlafaxine XR with Lorazepam at nighs .  13. PAF: On Eliquis and amiodarone for rate control.   -6/28 heart rate controlled     11/21/2022    1:15 PM 11/21/2022    5:14 AM 11/20/2022    6:28 PM  Vitals with BMI  Height   5\' 5"   Weight   218 lbs 11 oz  BMI   36.39  Systolic 129 110 161  Diastolic 44 57 45  Pulse 69 62 64    14. Anemia of chronic disease:  -6/28 Hgb stable at 8.5 yesterday 15.   H/o GIB due to stercoral ulcer: Avoid constipation.   -6/28 last BM today large  16. Morbid Obesity. Dietary education  -Body mass index is 36.39 kg/m. 17.  Hypotension  -Taking midodrine prior to dialysis    LOS: 1 days A FACE TO FACE EVALUATION WAS PERFORMED  Fanny Dance 11/21/2022, 8:29 AM

## 2022-11-21 NOTE — Progress Notes (Signed)
[facility] Progress Note   Subjective: Up in WC eating lunch. Actually looks better than I've ever seen her look. No C/Os. HD tomorrow on schedule.   Objective Vitals:   11/20/22 1700 11/20/22 1732 11/20/22 1828 11/21/22 0514  BP: (!) 136/45  (!) 136/45 (!) 110/57  Pulse:   64 62  Resp: 16  16 16   Temp: 98 F (36.7 C)  98 F (36.7 C) 98 F (36.7 C)  TempSrc: Oral  Oral Oral  SpO2: 100%   100%  Weight:  99.2 kg 99.2 kg   Height:   5\' 5"  (1.651 m)    Physical Exam General: Elderly female in NAD Heart: S1,S2 RRR No M/R Lungs: CTAB Abdomen: obese, NABS Extremities: LLE with dressing in place. Edema in hips. R BKA.  Dialysis Access: [facility] drsg intact    Additional Objective Labs: Basic Metabolic Panel: Recent Labs  Lab 11/17/22 0739 11/18/22 0151 11/19/22 0205 11/20/22 0116  NA 133* 134* 131* 132*  K 3.9 4.2 3.9 4.2  CL 98 97* 95* 95*  CO2 21* 23 25 26   GLUCOSE 80 85 207* 330*  BUN 60* 70* 42* 35*  CREATININE 4.84* 5.43* 3.97* 3.63*  CALCIUM 9.0 8.7* 8.4* 8.9  PHOS 6.4* 6.5* 4.6  --    Liver Function Tests: Recent Labs  Lab 11/17/22 0739 11/18/22 0151 11/19/22 0205  ALBUMIN 2.8* 2.6* 2.5*   No results for input(s): "LIPASE", "AMYLASE" in the last 168 hours. CBC: Recent Labs  Lab 11/15/22 0415 11/16/22 0152 11/17/22 0739 11/18/22 0151 11/19/22 0205 11/20/22 0116  WBC 4.8 5.3 5.5 5.7 4.8 5.9  NEUTROABS 3.9  --  3.7  --   --   --   HGB 8.3* 8.5* 9.1* 8.7* 8.4* 8.5*  HCT 26.9* 27.0* 30.3* 29.2* 28.0* 28.2*  MCV 77.3* 78.3* 81.5 79.3* 80.7 81.5  PLT 157 159 171 167 174 187   Blood Culture    Component Value Date/Time   SDES TISSUE 11/14/2022 0919   SPECREQUEST NONE 11/14/2022 0919   CULT  11/14/2022 0919    FEW PROTEUS MIRABILIS RARE KLEBSIELLA PNEUMONIAE NO ANAEROBES ISOLATED FEW ENTEROCOCCUS FAECALIS    REPTSTATUS 11/19/2022 FINAL 11/14/2022 0919    Cardiac Enzymes: No results for input(s): "CKTOTAL", "CKMB",  "CKMBINDEX", "TROPONINI" in the last 168 hours. CBG: Recent Labs  Lab 11/20/22 0742 11/20/22 1338 11/20/22 2156 11/21/22 0621 11/21/22 1132  GLUCAP 248* 164* 202* 156* 212*   Iron Studies: No results for input(s): "IRON", "TIBC", "TRANSFERRIN", "FERRITIN" in the last 72 hours. @lablastinr3 @ Studies/Results: No results found. Medications:  [START ON 11/22/2022]  ceFAZolin (ANCEF) IV      (feeding supplement) PROSource Plus  30 mL Oral BID BM   amiodarone  200 mg Oral Daily   apixaban  5 mg Oral BID   ascorbic acid  500 mg Oral Daily   atorvastatin  80 mg Oral Daily   Chlorhexidine Gluconate Cloth  6 each Topical BID   feeding supplement (NEPRO CARB STEADY)  237 mL Oral BID BM   folic acid  1 mg Oral Daily   insulin aspart  0-6 Units Subcutaneous TID WC   insulin glargine-yfgn  20 Units Subcutaneous QHS   insulin glargine-yfgn  40 Units Subcutaneous Daily   levothyroxine  75 mcg Oral QAC breakfast   [START ON 11/22/2022] midodrine  10 mg Oral Q T,Th,Sa-HD   multivitamin  1 tablet Oral QHS   pantoprazole  40 mg Oral BID AC   polyethylene glycol  17 g Oral Daily   ramelteon  8 mg Oral QHS   senna-docusate  2 tablet Oral BID   sevelamer carbonate  1,600 mg Oral TID WC   silver sulfADIAZINE   Topical Daily   venlafaxine XR  75 mg Oral Q breakfast   zinc sulfate  220 mg Oral Daily     Dialysis Orders: [facility] TTS 4:00 400/800 EDW 91.5kg 2K/2Ca TDC  -Heparin 4000 units IV TIW -Mircera 75 mcg IV q 2 weeks  - last 6/8 -Calcitriol 0.5 three times per week     Assessment/Plan: Infected left lower extremity wound - maggot infestation on admission. Blood cultures neg. Ortho following - s/p OR debridement/wound vac on 6/21. Wound cultures+ proteus/klebsiella --Continue cefazolin 2g qHD until 11/28/22 ESRD -  HD TTS. Next HD 11/22/2022 Thrombosed AVF - not salvageable. For access surgery next month  Hypotension/volume  - BP soft during admission. On midodrine pre HD. Albumin prn. Does  have some volume on exam. UF as able.  Anemia  - Hgb 8.5. Received Aranesp 150 on 6/23.  Metabolic bone disease -  Ca/Phos acceptable. Continue sevelamer binders with diet.  Nutrition - Renal diet with fluid restriction. + Prot supp.  AFib - on amiodarone/Eliquis  T2DM - historically poorly controlled. Insulin per primary AMS/Twitching - felt secondary to Lyrica toxicity in ESRD pt. Discontinued - continue to hold. Dispo - For CIR admit   [employer]. Wilian Kwong NP-C 11/21/2022, 12:18 PM  [employer] [phone]

## 2022-11-21 NOTE — Progress Notes (Signed)
Inpatient Rehabilitation  Patient information reviewed and entered into eRehab system by Jaidin Richison Nyshawn Gowdy, OTR/L, Rehab Quality Coordinator.   Information including medical coding, functional ability and quality indicators will be reviewed and updated through discharge.   

## 2022-11-21 NOTE — Progress Notes (Signed)
Patient ID: Susan [name], female   DOB: 12-Nov-1950, 72 y.o.   MRN: [id number] Met with the patient to review current situation, rehab process, team conference and plan of care. Discussed new order for incision care and sacral wound care. Reviewed medications for secondary risks including A-fib, HTN, HLD and DM. Patient reported she has Libre system and was using the Omnipod PTA; MD had taken her off of this for now; reports spouse is able to administer insulin.  Wears oxygen at HS at home. Continue to follow along to address educational issues to facilitate preparation for discharge. Pamelia Hoit

## 2022-11-21 NOTE — Evaluation (Signed)
Occupational Therapy Assessment and Plan  Patient Details  Name: Susan [name] MRN: [id number] Date of Birth: [birth year]  OT Diagnosis: abnormal posture, acute pain, muscle weakness (generalized), other lymphedema, pain in joint, and swelling of limb Rehab Potential: Rehab Potential (ACUTE ONLY): Fair ELOS: 3 weeks   Today's Date: 11/21/2022 OT Individual Time: 1100-1200 OT Individual Time Calculation (min): 60 min     Hospital Problem: Principal Problem:   Right below-knee amputee [facility])   Past Medical History:  Past Medical History:  Diagnosis Date   Anemia    hx   Anxiety    Arthritis    "generalized" (03/15/2014)   CAD (coronary artery disease)    MI in 2000 - MI  2007 - treated bare metal stent (no nuclear since then as 9/11)   Carotid artery disease (HCC)    Chronic diastolic heart failure (HCC)    a) ECHO (08/2013) EF 55-60% and RV function nl b) RHC (08/2013) RA 4, RV 30/5/7, PA 25/10 (16), PCWP 7, Fick CO/CI 6.3/2.7, PVR 1.5 WU, PA 61 and 66%   Daily headache    "~ every other day; since I fell in June" (03/15/2014)   Depression    Diabetic retinopathy (HCC)    Dyslipidemia    ESRD (end stage renal disease) (HCC)    Dialysis on Tues Thurs Sat   Exertional shortness of breath    History of kidney stones    HTN (hypertension)    Hypothyroidism    Obesity    Osteoarthritis    PAF (paroxysmal atrial fibrillation) (HCC)    Peripheral neuropathy    bilateral feet/hands   PONV (postoperative nausea and vomiting)    RBBB (right bundle branch block)    Old   Stroke (HCC)    mini strokes   Type II diabetes mellitus (HCC)    Type II, [name]. patient has omnipod insulin pump with Novolin R Insulin   Past Surgical History:  Past Surgical History:  Procedure Laterality Date   A/V FISTULAGRAM Left 11/07/2022   Procedure: A/V Fistulagram;  Surgeon: Leonie Douglas, MD;  [facility] INV[facility] CV LAB;  Service: Cardiovascular;  Laterality: Left;    ABDOMINAL HYSTERECTOMY  1980's   AMPUTATION Right 02/24/2018   Procedure: RIGHT FOOT GREAT TOE AND 2ND TOE AMPUTATION;  Surgeon: Nadara Mustard, MD;  Location: [facility] OR;  Service: Orthopedics;  Laterality: Right;   AMPUTATION Right 04/30/2018   Procedure: RIGHT TRANSMETATARSAL AMPUTATION;  Surgeon: Nadara Mustard, MD;  Location: [facility] OR;  Service: Orthopedics;  Laterality: Right;   AMPUTATION Right 05/02/2022   Procedure: RIGHT BELOW KNEE AMPUTATION;  Surgeon: Nadara Mustard, MD;  Location: [facility] OR;  Service: Orthopedics;  Laterality: Right;   APPLICATION OF WOUND VAC Right 06/13/2022   Procedure: APPLICATION OF WOUND VAC;  Surgeon: Nadara Mustard, MD;  Location: [facility] OR;  Service: Orthopedics;  Laterality: Right;   APPLICATION OF WOUND VAC Left 11/14/2022   Procedure: APPLICATION OF WOUND VAC;  Surgeon: Nadara Mustard, MD;  Location: [facility] OR;  Service: Orthopedics;  Laterality: Left;   AV FISTULA PLACEMENT Left 04/02/2022   Procedure: LEFT ARM ARTERIOVENOUS (AV) FISTULA CREATION;  Surgeon: Nada Libman, MD;  [facility]: [facility] OR;  Service: Vascular;  Laterality: Left;  PERIPHERAL NERVE BLOCK   BASCILIC VEIN TRANSPOSITION Left 07/31/2022   Procedure: LEFT ARM SECOND STAGE BASILIC VEIN TRANSPOSITION;  Surgeon: Nada Libman, MD;  [facility]: [facility] OR;  Service: Vascular;  Laterality: Left;  BIOPSY  05/27/2020   Procedure: BIOPSY;  Surgeon: Lanelle Bal, DO;  [facility]: [facility] ENDO SU[facility];  Service: Endoscopy;;   CATARACT EXTRACTION, BILATERAL Bilateral ?2013   COLONOSCOPY W/ POLYPECTOMY     COLONOSCOPY WITH PROPOFOL N/A 03/13/2019   Procedure: COLONOSCOPY WITH PROPOFOL;  Surgeon: Beverley Fiedler, MD;  Location: [facility] ENDOSCOPY;  Service: Gastroenterology;  Laterality: N/A;   CORONARY ANGIOPLASTY WITH STENT PLACEMENT  1999; 2007   "1 + 1"   ERCP N/A 02/03/2022   Procedure: ENDOSCOPIC RETROGRADE CHOLANGIOPANCREATOGRAPHY (ERCP);  Surgeon: Jeani Hawking, MD;  Location: [facility] ENDOSCOPY;  Service: Gastroenterology;  Laterality:  N/A;   ESOPHAGOGASTRODUODENOSCOPY (EGD) WITH PROPOFOL N/A 03/13/2019   Procedure: ESOPHAGOGASTRODUODENOSCOPY (EGD) WITH PROPOFOL;  Surgeon: Beverley Fiedler, MD;  Location: [facility] ENDOSCOPY;  Service: Gastroenterology;  Laterality: N/A;   ESOPHAGOGASTRODUODENOSCOPY (EGD) WITH PROPOFOL N/A 05/27/2020   Procedure: ESOPHAGOGASTRODUODENOSCOPY (EGD) WITH PROPOFOL;  Surgeon: Lanelle Bal, DO;  [facility];  Service: Endoscopy;  Laterality: N/A;   ESOPHAGOGASTRODUODENOSCOPY (EGD) WITH PROPOFOL N/A 09/03/2022   Procedure: ESOPHAGOGASTRODUODENOSCOPY (EGD) WITH PROPOFOL;  Surgeon: Jeani Hawking, MD;  Location: [facility] ENDOSCOPY;  Service: Gastroenterology;  Laterality: N/A;   EYE SURGERY Bilateral    lazer   FLEXIBLE SIGMOIDOSCOPY N/A 05/23/2022   Procedure: FLEXIBLE SIGMOIDOSCOPY;  Surgeon: Jeani Hawking, MD;  Location: [facility] ENDOSCOPY;  Service: Gastroenterology;  Laterality: N/A;   FLEXIBLE SIGMOIDOSCOPY N/A 05/24/2022   Procedure: FLEXIBLE SIGMOIDOSCOPY;  Surgeon: Imogene Burn, MD;  Location: [facility] ENDOSCOPY;  Service: Gastroenterology;  Laterality: N/A;   FLEXIBLE SIGMOIDOSCOPY N/A 09/03/2022   Procedure: FLEXIBLE SIGMOIDOSCOPY;  Surgeon: Jeani Hawking, MD;  Location: [facility] ENDOSCOPY;  Service: Gastroenterology;  Laterality: N/A;   HEMOSTASIS CLIP PLACEMENT  03/13/2019   Procedure: HEMOSTASIS CLIP PLACEMENT;  Surgeon: Beverley Fiedler, MD;  Location: [facility] ENDOSCOPY;  Service: Gastroenterology;;   HEMOSTASIS CLIP PLACEMENT  05/23/2022   Procedure: HEMOSTASIS CLIP PLACEMENT;  Surgeon: Jeani Hawking, MD;  Location: [facility] ENDOSCOPY;  Service: Gastroenterology;;   HEMOSTASIS CONTROL  05/24/2022   Procedure: HEMOSTASIS CONTROL;  Surgeon: Imogene Burn, MD;  Location: [facility] ENDOSCOPY;  Service: Gastroenterology;;   HOT HEMOSTASIS N/A 05/23/2022   Procedure: HOT HEMOSTASIS (ARGON PLASMA COAGULATION/BICAP);  Surgeon: Jeani Hawking, MD;  Location: [facility] ENDOSCOPY;  Service: Gastroenterology;  Laterality: N/A;   I & D EXTREMITY  Left 05/05/2022   Procedure: IRRIGATION AND DEBRIDEMENT LEFT ARM AV FISTULA;  Surgeon: Cephus Shelling, MD;  Location: [facility] OR;  Service: Vascular;  Laterality: Left;   I & D EXTREMITY N/A 11/14/2022   Procedure: IRRIGATION AND DEBRIDEMENT OF LOWER EXTREMITY WOUND;  Surgeon: Nadara Mustard, MD;  [facility] OR;  Service: Orthopedics;  Laterality: N/A;   INSERTION OF DIALYSIS CATHETER Right 04/02/2022   Procedure: INSERTION OF TUNNELED DIALYSIS CATHETER;  Surgeon: Nada Libman, MD;  Location: [facility] OR;  Service: Vascular;  Laterality: Right;   KNEE ARTHROSCOPY Left 10/25/2006   POLYPECTOMY  03/13/2019   Procedure: POLYPECTOMY;  Surgeon: Beverley Fiedler, MD;  Location: [facility] ENDOSCOPY;  Service: Gastroenterology;;   REMOVAL OF STONES  02/03/2022   Procedure: REMOVAL OF STONES;  Surgeon: Jeani Hawking, MD;  Location: [facility] ENDOSCOPY;  Service: Gastroenterology;;   REVISON OF ARTERIOVENOUS FISTULA Left 08/20/2022   Procedure: REVISON OF LEFT ARM ARTERIOVENOUS FISTULA;  Surgeon: Nada Libman, MD;  [facility] OR;  Service: Vascular;  Laterality: Left;   RIGHT HEART CATH N/A 07/24/2017   Procedure: RIGHT HEART CATH;  Surgeon: Dolores Patty, MD;  [facility];  Service: Cardiovascular;  Laterality: N/A;   RIGHT HEART CATHETERIZATION N/A 09/22/2013   Procedure: RIGHT HEART CATH;  Surgeon: Dolores Patty, MD;  Location: [facility] CATH LAB;  Service: Cardiovascular;  Laterality: N/A;   SHOULDER ARTHROSCOPY WITH OPEN ROTATOR CUFF REPAIR Right 03/14/2014   Procedure: RIGHT SHOULDER ARTHROSCOPY WITH BICEPS RELEASE, OPEN SUBSCAPULA REPAIR, OPEN SUPRASPINATUS REPAIR.;  Surgeon: Cammy Copa, MD;  Location: [facility] OR;  Service: Orthopedics;  Laterality: Right;   SPHINCTEROTOMY  02/03/2022   Procedure: SPHINCTEROTOMY;  Surgeon: Jeani Hawking, MD;  Location: [facility] ENDOSCOPY;  Service: Gastroenterology;;   STUMP REVISION Right 06/13/2022   Procedure: REVISION RIGHT BELOW KNEE AMPUTATION;  Surgeon:  Nadara Mustard, MD;  Location: [facility] OR;  Service: Orthopedics;  Laterality: Right;   TEE WITHOUT CARDIOVERSION N/A 02/04/2022   Procedure: TRANSESOPHAGEAL ECHOCARDIOGRAM (TEE);  Surgeon: Dolores Patty, MD;  Location: [facility] ENDOSCOPY;  Service: Cardiovascular;  Laterality: N/A;   THROMBECTOMY W/ EMBOLECTOMY Left 08/20/2022   Procedure: THROMBECTOMY OF LEFT ARM ARTERIOVENOUS FISTULA;  Surgeon: Nada Libman, MD;  [facility] OR;  Service: Vascular;  Laterality: Left;   TOE AMPUTATION Right 02/24/2018   GREAT TOE AND 2ND TOE AMPUTATION   TUBAL LIGATION  1970's    Assessment & Plan Clinical Impression: Susan [name] is a 72 year old 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 She was at [facility] since BKA revision 01/24 and has multiple admission due to medical issues. She was unable to bear weight on leg and eventually discharged to home 09/23/22 doing hoyer lifts.  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 She was seen in office by Dr. Lajoyce Corners on 11/10/22 and found to have increase in necrotic tissue with maggots in the wound and sent to the hospital for admission and management.       She was started on IV Zosyn/Vanc and patient underwent I and D with placement of wound VAC on 06/21 by Dr. Lajoyce Corners.  She was found to have anhedonia, sadness,acute on chronic insomnia and episodes of panic attacks. Dr. Gasper Sells  consulted for input on 06/18 due to long standing hx of MDD/GAD w/panic attacks and patient transitioned from Vortioxetine to Venlafaxine XR. She did express wish to stop HD and transfer to Hospice if leg could not be salvaged and Palliative care consulted recommended to discuss GOC. Her blood cultures negative and wound culture grew out proteus mirabilis, Klebsiella pneumoniae and Enterococcus fecalis. Antibiotics changed to Cefazolin with recommendations to complete 2 week course treatment with EOT 07/06 per Dr. Danelle Earthly. HD has been ongoing with extra HD sessions help manage fluid overload with SOB. On 06/26 after HD session, she was found to have significant hypotension with SBP in 70's, lethargy and asterixis. She was treated with fluid boluses with improvement. Nephrology felt that abnormal movements due to lyrica which was discontinued.    Patient currently requires total with basic self-care skills and IADL secondary to muscle weakness and muscle joint tightness,  decreased cardiorespiratoy endurance, unbalanced muscle activation, decreased coordination, and decreased motor planning, and decreased sitting balance, decreased standing balance, decreased postural control, and decreased balance strategies.  Prior to hospitalization, patient could complete bathing and transfers with husband providing with max.  Patient will benefit from skilled intervention to decrease level of assist with basic self-care skills, increase independence with basic self-care skills, and increase level of independence with iADL prior to discharge home with care partner.  Anticipate patient will require 24 hour supervision and moderate physical assestance and follow up home health.  OT - End of Session Activity Tolerance: Decreased this session Endurance Deficit: Yes Endurance Deficit Description: frequent rests required OT Assessment Rehab Potential (ACUTE ONLY): Fair OT Barriers to Discharge: Home environment  access/layout;Incontinence;Weight;Wound Care OT Barriers to Discharge Comments: wound, body habitus, bowel and bladder incontinence, pain OT Patient demonstrates impairments in the following area(s): Balance;Safety;Sensory;Edema;Skin Integrity;Endurance;Motor;Pain OT Basic ADL's Functional Problem(s): Grooming;Bathing;Dressing;Toileting OT Advanced ADL's Functional Problem(s): Simple Meal Preparation;Laundry;Light Housekeeping OT Transfers Functional Problem(s): Toilet;Tub/Shower OT Additional Impairment(s): Fuctional Use of Upper Extremity OT Plan OT Intensity: Minimum of 1-2 x/day, 45 to 90 minutes OT Frequency: 5 out of 7 days OT Duration/Estimated Length of Stay: 3 weeks OT Treatment/Interventions: Balance/vestibular training;Discharge planning;Functional electrical stimulation;Pain management;Self Care/advanced ADL retraining;Therapeutic Activities;UE/LE Coordination activities;Disease mangement/prevention;Functional mobility training;Cognitive remediation/compensation;Patient/family education;Therapeutic Exercise;Skin care/wound managment;Visual/perceptual remediation/compensation;Neuromuscular re-education;Splinting/orthotics;UE/LE Strength taining/ROM;Psychosocial support;Wheelchair propulsion/positioning OT Self Feeding Anticipated Outcome(s): indep OT Basic Self-Care Anticipated Outcome(s): mod I UB, mod A LB OT Toileting Anticipated Outcome(s): mod A OT Bathroom Transfers Anticipated Outcome(s): mod A OT Recommendation Recommendations for Other Services: Neuropsych consult;Therapeutic Recreation consult Therapeutic Recreation Interventions: Pet therapy;Kitchen group;Stress management;Outing/community reintergration Patient destination: Home Follow Up Recommendations: Home health OT Equipment Recommended: 3 in 1 bedside comode;Sliding board Equipment Details: DABSC, transfer board (may need)   OT Evaluation Precautions/Restrictions  Precautions Precautions: Fall Precaution  Comments: LLE wound Restrictions Weight Bearing Restrictions: Yes LLE Weight Bearing: Weight bearing as tolerated General Chart Reviewed: Yes Family/Caregiver Present: No Vital Signs Therapy Vitals Temp: 98.1 F (36.7 C) Temp Source: Oral Pulse Rate: 69 Resp: 17 BP: (!) 129/44 Patient Position (if appropriate): Sitting Oxygen Therapy SpO2: 95 % O2 Device: Room Air Pain Pain Assessment Pain Scale: 0-10 Pain Score: 2  Pain Type: Acute pain Pain Location: Leg Pain Orientation: Left Pain Descriptors / Indicators: Aching Pain Onset: On-going Patients Stated Pain Goal: 0 Pain Intervention(s): Emotional support;Elevated extremity;Repositioned Multiple Pain Sites: No Home Living/Prior Functioning Home Living Living Arrangements: Spouse/significant other Available Help at Discharge: Family, Available 24 hours/day Type of Home: Mobile home Home Access: Ramped entrance Home Layout: One level Bathroom Shower/Tub: Tub/shower unit, Door [employer] Toilet: Handicapped height Bathroom Accessibility: Yes Additional Comments: Husband is out on FMLA until Sept. She was scheduled to get a prosthesis for RLE prior to admission, sponge bathed and used incontinence briefs with husband assist with hoyer prior to admission since Dec  Lives With: Spouse IADL History Homemaking Responsibilities: No Current License: No Occupation: Retired Type of Occupation: factory work Leisure and Hobbies: spending time with family and 2 dogs Prior Function Level of Independence: Needs assistance with ADLs Vision Baseline Vision/History: 1 Wears glasses Ability to See in Adequate Light: 0 Adequate Patient Visual Report: Blurring of vision Vision Assessment?: Yes Perception  Perception: Within Functional Limits Praxis Praxis: Intact Cognition Cognition Overall Cognitive Status: Within Functional Limits for tasks assessed Arousal/Alertness: Awake/alert Memory: Appears intact Awareness: Appears  intact Problem Solving: Appears intact Safety/Judgment: Appears intact Brief Interview for Mental Status (BIMS) Repetition of  Three Words (First Attempt): 3 Temporal Orientation: Year: Correct Temporal Orientation: Month: Accurate within 5 days Temporal Orientation: Day: Correct Recall: "Sock": Yes, no cue required Recall: "Blue": Yes, no cue required Recall: "Bed": Yes, no cue required BIMS Summary Score: 15 Sensation Sensation Light Touch: Impaired by gross assessment Hot/Cold: Impaired by gross assessment Proprioception: Impaired by gross assessment Stereognosis: Impaired by gross assessment Coordination Gross Motor Movements are Fluid and Coordinated: No Fine Motor Movements are Fluid and Coordinated: No Coordination and Movement Description: effects of pain, amputation and B sensory deficits as well as prolonged bed rest and debility Finger Nose Finger Test: impaired 9 Hole Peg Test: TBA Motor  Motor Motor: Abnormal postural alignment and control Motor - Skilled Clinical Observations: significant deficits due to effects of prolonged debility and pain  Trunk/Postural Assessment  Cervical Assessment Cervical Assessment: Within Functional Limits Lumbar Assessment Lumbar Assessment: Exceptions to [facility] Postural Control Postural Control: Deficits on evaluation Protective Responses: delayed Postural Limitations: due to trunk and hip weakness  Balance Dynamic Sitting Balance Sitting balance - Comments: able to balance sitting EOB only with support Extremity/Trunk Assessment RUE Assessment RUE Assessment: Exceptions to [facility] Passive Range of Motion (PROM) Comments: 0-65 Active Range of Motion (AROM) Comments: 0-50 General Strength Comments: 2/5 prox, 3/5 distal LUE Assessment LUE Assessment: Exceptions to [facility] Passive Range of Motion (PROM) Comments: 0-70 Active Range of Motion (AROM) Comments: 0-55 General Strength Comments: grossly 2+/5 prox, 3-/5 distal  Care Tool Care  Tool Self Care Eating   Eating Assist Level: Set up assist    Oral Care    Oral Care Assist Level: Minimal Assistance - Patient > 75%    Bathing   Body parts bathed by patient: Right arm;Left arm;Chest;Face Body parts bathed by helper: Buttocks;Front perineal area;Abdomen;Right upper leg;Left upper leg Body parts n/a: Right lower leg;Left lower leg Assist Level: Dependent - Patient 0%    Upper Body Dressing(including orthotics)   What is the patient wearing?: Hospital gown only   Assist Level: Maximal Assistance - Patient 25 - 49%    Lower Body Dressing (excluding footwear)   What is the patient wearing?: Incontinence brief;Hospital gown only;Ace wrap/stump shrinker Assist for lower body dressing: Dependent - Patient 0%    Putting on/Taking off footwear   What is the patient wearing?: Orthosis Assist for footwear: Dependent - Patient 0%       Care Tool Toileting Toileting activity   Assist for toileting: Dependent - Patient 0%     Care Tool Bed Mobility Roll left and right activity   Roll left and right assist level: Maximal Assistance - Patient 25 - 49%    Sit to lying activity   Sit to lying assist level: 2 Helpers    Lying to sitting on side of bed activity   Lying to sitting on side of bed assist level: the ability to move from lying on the back to sitting on the side of the bed with no back support.: 2 Helpers     Care Tool Transfers Sit to stand transfer Sit to stand activity did not occur: Safety/medical concerns      Chair/bed transfer   Chair/bed transfer assist level: Dependent - mechanical lift     Toilet transfer Toilet transfer activity did not occur: Safety/medical concerns       Care Tool Cognition  Expression of Ideas and Wants Expression of Ideas and Wants: 4. Without difficulty (complex and basic) - expresses complex messages without difficulty and with speech that is  clear and easy to understand  Understanding Verbal and Non-Verbal Content  Understanding Verbal and Non-Verbal Content: 4. Understands (complex and basic) - clear comprehension without cues or repetitions   Memory/Recall Ability Memory/Recall Ability : Current season;Location of own room;That he or she is in a hospital/hospital unit   Refer to Care Plan for Long Term Goals  SHORT TERM GOAL WEEK 1 OT Short Term Goal 1 (Week 1): Pt will complete UB seflcare EOB unsupported with CGA OT Short Term Goal 2 (Week 1): Pt will access DABSC via TB with 2 helpers OT Short Term Goal 3 (Week 1): Pt will trial sit to stand in STEDY frame with 2 helper assist  Recommendations for other services: Neuropsych and Therapeutic Recreation  Pet therapy, Kitchen group, and Stress management   Skilled Therapeutic Intervention ADL ADL Eating: Set up Where Assessed-Eating: Wheelchair Grooming: Minimal assistance Where Assessed-Grooming: Sitting at sink;Wheelchair Upper Body Bathing: Moderate assistance Where Assessed-Upper Body Bathing: Sitting at sink;Wheelchair Lower Body Bathing: Dependent Where Assessed-Lower Body Bathing: Bed level Upper Body Dressing: Moderate assistance Where Assessed-Upper Body Dressing: Wheelchair Lower Body Dressing: Dependent Where Assessed-Lower Body Dressing: Bed level Toileting: Dependent Where Assessed-Toileting: Bed level Toilet Transfer: Unable to assess ADL Comments: pt completing all toileting bed level as pt is incontinent of bowel and bladder and was at home, will trial DABSC when able, bed level LB self care, sink side w/c level UB self care Mobility  Bed Mobility Bed Mobility: Rolling Right;Rolling Left Rolling Right: Maximal Assistance - Patient 25-49% Rolling Left: Maximal Assistance - Patient 25-49%  OT Treatment/Intervention:  Pt seen for full initial OT evaluation and training session this am. Care coordination with team PT before and after session for care coordination. Pt in bed upon OT arrival. OT introduced role of therapy and  purpose of session. Pt open to all presented assessment and training this visit. OT assisted and assessed ADL's, mobility, vision, sensation. cognition/lang, G/FMC, strength and balance throughout session. See above for levels. Pt tolerated Maxi move transfer to recliner manual chair and then full sink side ADL. Educ on pain mngt, positioning, skin protection and safety. Pt will benefit from skilled OT services at CIR to maximize function and safety with recommendation to return home with  husband assist and HHOT services upon d/c home. Pt left at end of session in recliner w/c with LE's elevated- R on amputee pad and chair alarm set, tray table and nurse call bell within reach.    Discharge Criteria: Patient will be discharged from OT if patient refuses treatment 3 consecutive times without medical reason, if treatment goals not met, if there is a change in medical status, if patient makes no progress towards goals or if patient is discharged from hospital.  The above assessment, treatment plan, treatment alternatives and goals were discussed and mutually agreed upon: by patient  Vicenta Dunning 11/21/2022, 2:14 PM

## 2022-11-21 NOTE — Evaluation (Addendum)
Physical Therapy Assessment and Plan  Patient Details  Name: Susan [name] MRN: [id number] Date of Birth: 11/04/1950  PT Diagnosis: Cognitive deficits, Difficulty walking, Impaired sensation, Muscle weakness, and Pain in leg wound Rehab Potential: Fair ELOS: 21 days   Today's Date: 11/21/2022 PT Individual Time: 0845-1000 PT Individual Time Calculation (min): 75 min    Hospital Problem: Principal Problem:   Right below-knee amputee [employer])   Past Medical History:  Past Medical History:  Diagnosis Date   Anemia    hx   Anxiety    Arthritis    "generalized" (03/15/2014)   CAD (coronary artery disease)    MI in 2000 - MI  2007 - treated bare metal stent (no nuclear since then as 9/11)   Carotid artery disease (HCC)    Chronic diastolic heart failure (HCC)    a) ECHO (08/2013) EF 55-60% and RV function nl b) RHC (08/2013) RA 4, RV 30/5/7, PA 25/10 (16), PCWP 7, Fick CO/CI 6.3/2.7, PVR 1.5 WU, PA 61 and 66%   Daily headache    "~ every other day; since I fell in June" (03/15/2014)   Depression    Diabetic retinopathy (HCC)    Dyslipidemia    ESRD (end stage renal disease) (HCC)    Dialysis on Tues Thurs Sat   Exertional shortness of breath    History of kidney stones    HTN (hypertension)    Hypothyroidism    Obesity    Osteoarthritis    PAF (paroxysmal atrial fibrillation) (HCC)    Peripheral neuropathy    bilateral feet/hands   PONV (postoperative nausea and vomiting)    RBBB (right bundle branch block)    Old   Stroke (HCC)    mini strokes   Type II diabetes mellitus (HCC)    Type II, [name]. patient has omnipod insulin pump with Novolin R Insulin   Past Surgical History:  Past Surgical History:  Procedure Laterality Date   A/V FISTULAGRAM Left 11/07/2022   Procedure: A/V Fistulagram;  Surgeon: Leonie Douglas, MD;  [facility];  Service: Cardiovascular;  Laterality: Left;   ABDOMINAL HYSTERECTOMY  1980's   AMPUTATION Right  02/24/2018   Procedure: RIGHT FOOT GREAT TOE AND 2ND TOE AMPUTATION;  Surgeon: Nadara Mustard, MD;  Location: [facility] OR;  Service: Orthopedics;  Laterality: Right;   AMPUTATION Right 04/30/2018   Procedure: RIGHT TRANSMETATARSAL AMPUTATION;  Surgeon: Nadara Mustard, MD;  Location: [facility] OR;  Service: Orthopedics;  Laterality: Right;   AMPUTATION Right 05/02/2022   Procedure: RIGHT BELOW KNEE AMPUTATION;  Surgeon: Nadara Mustard, MD;  Location: [facility] OR;  Service: Orthopedics;  Laterality: Right;   APPLICATION OF WOUND VAC Right 06/13/2022   Procedure: APPLICATION OF WOUND VAC;  Surgeon: Nadara Mustard, MD;  [facility]: [facility] OR;  Service: Orthopedics;  Laterality: Right;   APPLICATION OF WOUND VAC Left 11/14/2022   Procedure: APPLICATION OF WOUND VAC;  Surgeon: Nadara Mustard, MD;  [facility]: [facility] OR;  Service: Orthopedics;  Laterality: Left;   AV FISTULA PLACEMENT Left 04/02/2022   Procedure: LEFT ARM ARTERIOVENOUS (AV) FISTULA CREATION;  Surgeon: Nada Libman, MD;  [facility];  Service: Vascular;  Laterality: Left;  PERIPHERAL NERVE BLOCK   BASCILIC VEIN TRANSPOSITION Left 07/31/2022   Procedure: LEFT ARM SECOND STAGE BASILIC VEIN TRANSPOSITION;  Surgeon: Nada Libman, MD;  [facility];  Service: Vascular;  Laterality: Left;   BIOPSY  05/27/2020   Procedure:  BIOPSY;  Surgeon: Lanelle Bal, DO;  [facility]: [facility];  Service: Endoscopy;;   CATARACT EXTRACTION, BILATERAL Bilateral ?2013   COLONOSCOPY W/ POLYPECTOMY     COLONOSCOPY WITH PROPOFOL N/A 03/13/2019   Procedure: COLONOSCOPY WITH PROPOFOL;  Surgeon: Beverley Fiedler, MD;  Location: [facility] ENDOSCOPY;  Service: Gastroenterology;  Laterality: N/A;   CORONARY ANGIOPLASTY WITH STENT PLACEMENT  1999; 2007   "1 + 1"   ERCP N/A 02/03/2022   Procedure: ENDOSCOPIC RETROGRADE CHOLANGIOPANCREATOGRAPHY (ERCP);  Surgeon: Jeani Hawking, MD;  Location: [facility] ENDOSCOPY;  Service: Gastroenterology;  Laterality: N/A;   ESOPHAGOGASTRODUODENOSCOPY (EGD) WITH PROPOFOL  N/A 03/13/2019   Procedure: ESOPHAGOGASTRODUODENOSCOPY (EGD) WITH PROPOFOL;  Surgeon: Beverley Fiedler, MD;  Location: [facility] ENDOSCOPY;  Service: Gastroenterology;  Laterality: N/A;   ESOPHAGOGASTRODUODENOSCOPY (EGD) WITH PROPOFOL N/A 05/27/2020   Procedure: ESOPHAGOGASTRODUODENOSCOPY (EGD) WITH PROPOFOL;  Surgeon: Lanelle Bal, DO;  [facility];  Service: Endoscopy;  Laterality: N/A;   ESOPHAGOGASTRODUODENOSCOPY (EGD) WITH PROPOFOL N/A 09/03/2022   Procedure: ESOPHAGOGASTRODUODENOSCOPY (EGD) WITH PROPOFOL;  Surgeon: Jeani Hawking, MD;  Location: [facility] ENDOSCOPY;  Service: Gastroenterology;  Laterality: N/A;   EYE SURGERY Bilateral    lazer   FLEXIBLE SIGMOIDOSCOPY N/A 05/23/2022   Procedure: FLEXIBLE SIGMOIDOSCOPY;  Surgeon: Jeani Hawking, MD;  Location: [facility] ENDOSCOPY;  Service: Gastroenterology;  Laterality: N/A;   FLEXIBLE SIGMOIDOSCOPY N/A 05/24/2022   Procedure: FLEXIBLE SIGMOIDOSCOPY;  Surgeon: Imogene Burn, MD;  Location: [facility] ENDOSCOPY;  Service: Gastroenterology;  Laterality: N/A;   FLEXIBLE SIGMOIDOSCOPY N/A 09/03/2022   Procedure: FLEXIBLE SIGMOIDOSCOPY;  Surgeon: Jeani Hawking, MD;  Location: [facility] ENDOSCOPY;  Service: Gastroenterology;  Laterality: N/A;   HEMOSTASIS CLIP PLACEMENT  03/13/2019   Procedure: HEMOSTASIS CLIP PLACEMENT;  Surgeon: Beverley Fiedler, MD;  Location: [facility] ENDOSCOPY;  Service: Gastroenterology;;   HEMOSTASIS CLIP PLACEMENT  05/23/2022   Procedure: HEMOSTASIS CLIP PLACEMENT;  Surgeon: Jeani Hawking, MD;  Location: [facility] ENDOSCOPY;  Service: Gastroenterology;;   HEMOSTASIS CONTROL  05/24/2022   Procedure: HEMOSTASIS CONTROL;  Surgeon: Imogene Burn, MD;  Location: [facility] ENDOSCOPY;  Service: Gastroenterology;;   HOT HEMOSTASIS N/A 05/23/2022   Procedure: HOT HEMOSTASIS (ARGON PLASMA COAGULATION/BICAP);  Surgeon: Jeani Hawking, MD;  Location: [facility] ENDOSCOPY;  Service: Gastroenterology;  Laterality: N/A;   I & D EXTREMITY Left 05/05/2022   Procedure: IRRIGATION AND  DEBRIDEMENT LEFT ARM AV FISTULA;  Surgeon: Cephus Shelling, MD;  Location: [facility] OR;  Service: Vascular;  Laterality: Left;   I & D EXTREMITY N/A 11/14/2022   Procedure: IRRIGATION AND DEBRIDEMENT OF LOWER EXTREMITY WOUND;  Surgeon: Nadara Mustard, MD;  [facility];  Service: Orthopedics;  Laterality: N/A;   INSERTION OF DIALYSIS CATHETER Right 04/02/2022   Procedure: INSERTION OF TUNNELED DIALYSIS CATHETER;  Surgeon: Nada Libman, MD;  Location: [facility] OR;  Service: Vascular;  Laterality: Right;   KNEE ARTHROSCOPY Left 10/25/2006   POLYPECTOMY  03/13/2019   Procedure: POLYPECTOMY;  Surgeon: Beverley Fiedler, MD;  [facility] ENDOS[facility];  Service: Gastroenterology;;   REMOVAL OF STONES  02/03/2022   Procedure: REMOVAL OF STONES;  Surgeon: Jeani Hawking, MD;  Location: [facility] ENDOSCOPY;  Service: Gastroenterology;;   REVISON OF ARTERIOVENOUS FISTULA Left 08/20/2022   Procedure: REVISON OF LEFT ARM ARTERIOVENOUS FISTULA;  Surgeon: Nada Libman, MD;  [facility];  Service: Vascular;  Laterality: Left;   RIGHT HEART CATH N/A 07/24/2017   Procedure: RIGHT HEART CATH;  Surgeon: Dolores Patty, MD;  [facility];  Service:  Cardiovascular;  Laterality: N/A;   RIGHT HEART CATHETERIZATION N/A 09/22/2013   Procedure: RIGHT HEART CATH;  Surgeon: Dolores Patty, MD;  Location: [facility] CATH LAB;  Service: Cardiovascular;  Laterality: N/A;   SHOULDER ARTHROSCOPY WITH OPEN ROTATOR CUFF REPAIR Right 03/14/2014   Procedure: RIGHT SHOULDER ARTHROSCOPY WITH BICEPS RELEASE, OPEN SUBSCAPULA REPAIR, OPEN SUPRASPINATUS REPAIR.;  Surgeon: Cammy Copa, MD;  Location: [facility] OR;  Service: Orthopedics;  Laterality: Right;   SPHINCTEROTOMY  02/03/2022   Procedure: SPHINCTEROTOMY;  Surgeon: Jeani Hawking, MD;  Location: [facility] ENDOSCOPY;  Service: Gastroenterology;;   STUMP REVISION Right 06/13/2022   Procedure: REVISION RIGHT BELOW KNEE AMPUTATION;  Surgeon: Nadara Mustard, MD;  Location: [facility] OR;  Service:  Orthopedics;  Laterality: Right;   TEE WITHOUT CARDIOVERSION N/A 02/04/2022   Procedure: TRANSESOPHAGEAL ECHOCARDIOGRAM (TEE);  Surgeon: Dolores Patty, MD;  Location: [facility] ENDOSCOPY;  Service: Cardiovascular;  Laterality: N/A;   THROMBECTOMY W/ EMBOLECTOMY Left 08/20/2022   Procedure: THROMBECTOMY OF LEFT ARM ARTERIOVENOUS FISTULA;  Surgeon: Nada Libman, MD;  [facility];  Service: Vascular;  Laterality: Left;   TOE AMPUTATION Right 02/24/2018   GREAT TOE AND 2ND TOE AMPUTATION   TUBAL LIGATION  1970's    Assessment & Plan Clinical Impression: Patient is a 72 y.o.  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 She was at [facility] since BKA revision 01/24 and has multiple admission due to medical issues. She was unable to bear weight on leg and eventually discharged to home 09/23/22 doing hoyer lifts.  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 She was seen in office by Dr. Lajoyce Corners on 11/10/22 and found to have increase in necrotic tissue with maggots in the wound and sent to the hospital for admission and management.     She was started on IV Zosyn/Vanc and patient underwent I and D with placement of wound VAC on 06/21 by Dr. Lajoyce Corners.  She was found to have anhedonia, sadness,acute on chronic insomnia and episodes of panic attacks. Dr. Gasper Sells consulted for input on 06/18 due to long standing hx of MDD/GAD  w/panic attacks and patient transitioned from Vortioxetine to Venlafaxine XR. She did express wish to stop HD and transfer to Hospice if leg could not be salvaged and Palliative care consulted recommended to discuss GOC. Her blood cultures negative and wound culture grew out proteus mirabilis, Klebsiella pneumoniae and Enterococcus fecalis. Antibiotics changed to Cefazolin with recommendations to complete 2 week course treatment with EOT 07/06 per Dr. Danelle Earthly. HD has been ongoing with extra HD sessions help manage fluid overload with SOB. On 06/26 after HD session, she was found to have significant hypotension with SBP in 70's, lethargy and asterixis. She was treated with fluid boluses with improvement. Nephrology felt that abnormal movements due to lyrica which was discontinued.  Patient transferred to CIR on 11/20/2022 .   Patient currently requires total assist with mobility secondary to muscle weakness, decreased cardiorespiratoy endurance, decreased motor planning, decreased memory, and decreased  balance strategies.  Prior to hospitalization, patient was total assist with mobility and lived with Spouse in a Mobile home home.  Home access is  Ramped entrance (not ADA compliant and has too steep of an incline).  Patient will benefit from skilled PT intervention to maximize safe functional mobility, minimize fall risk, and decrease caregiver burden for planned discharge home with 24 hour assist.  Anticipate patient will benefit from follow up [facility] at discharge.  PT - End of Session Activity Tolerance: Tolerates 10 - 20 min activity with multiple rests Endurance Deficit: Yes PT Assessment Rehab Potential (ACUTE/IP ONLY): Fair PT Barriers to Discharge: Inaccessible home environment;Decreased caregiver support;Home environment access/layout;IV antibiotics;Incontinence;Wound Care;Lack of/limited family support;Insurance for SNF coverage;Weight;Hemodialysis;Medication compliance;Nutrition means PT  Patient demonstrates impairments in the following area(s): Balance;Edema;Endurance;Motor;Nutrition;Pain;Perception;Safety;Sensory;Skin Integrity PT Transfers Functional Problem(s): Bed Mobility;Bed to Chair;Car;Furniture PT Locomotion Functional Problem(s): Wheelchair Mobility;Ambulation PT Plan PT Intensity: Minimum of 1-2 x/day ,45 to 90 minutes PT Frequency: 5 out of 7 days PT Duration Estimated Length of Stay: 21 days PT Treatment/Interventions: Ambulation/gait training;Community reintegration;DME/adaptive equipment instruction;Neuromuscular re-education;Psychosocial support;UE/LE Strength taining/ROM;Wheelchair propulsion/positioning;Balance/vestibular training;Discharge planning;Functional electrical stimulation;Pain management;Skin care/wound management;Therapeutic Activities;UE/LE Coordination activities;Cognitive remediation/compensation;Disease management/prevention;Functional mobility training;Patient/family education;Splinting/orthotics;Therapeutic Exercise;Visual/perceptual remediation/compensation PT Transfers Anticipated Outcome(s): MaxA PT Locomotion Anticipated Outcome(s): MaxA for w/c mobility PT Recommendation Recommendations for Other Services: Neuropsych consult Follow Up Recommendations: Home health PT;24 hour supervision/assistance;Outpatient PT Patient destination: Home Equipment Recommended: To be determined   PT Evaluation Precautions/Restrictions Precautions Precautions: Fall Precaution Comments: L lateral lower leg open wound, R rotator cuff tears, General   Vital Signs Pain Pain Assessment Pain Scale: 0-10 Pain Score: 4  Pain Type: Acute pain Pain Location: Leg Pain Orientation: Left Pain Descriptors / Indicators: Aching;Discomfort Pain Onset: On-going Patients Stated Pain Goal: 0 Pain Intervention(s): Medication (See eMAR);Repositioned Multiple Pain Sites: No Pain Interference Pain Interference Pain Effect on Sleep: 2. Occasionally Pain  Interference with Therapy Activities: 3. Frequently Pain Interference with Day-to-Day Activities: 2. Occasionally Home Living/Prior Functioning Home Living Available Help at Discharge: Family;Available 24 hours/day Type of Home: Mobile home Home Access: Ramped entrance (not ADA compliant and has too steep of an incline) Home Layout: One level Bathroom Shower/Tub: Tub/shower unit;Door Bathroom Toilet: Handicapped height Bathroom Accessibility: Yes Additional Comments: Husband is out on FMLA until Sept. She was scheduled to get a prosthesis for RLE prior to admission, sponge bathed and used incontinence briefs with husband assist with hoyer prior to admission since Dec  Lives With: Spouse Prior Function Level of Independence: Needs assistance with ADLs;Needs assistance with homemaking;Needs assistance with tranfers  Able to Take Stairs?: No Driving: No Vision/Perception  Vision - History Ability to See in Adequate Light: 0 Adequate Vision - Assessment Eye Alignment: Within Functional Limits Ocular Range of Motion: Within Functional Limits Tracking/Visual Pursuits: Able to track stimulus in all quads without difficulty Perception Perception: Within Functional Limits Praxis Praxis: Intact  Cognition Overall Cognitive Status: Within Functional Limits for tasks assessed Arousal/Alertness: Awake/alert Memory: Impaired Awareness: Appears intact Problem Solving: Appears intact Safety/Judgment: Appears intact Sensation Sensation Light Touch: Impaired by gross assessment Hot/Cold: Impaired by gross assessment Proprioception: Impaired by gross assessment Stereognosis: Impaired by gross assessment Coordination Gross Motor Movements are Fluid and Coordinated: No Fine Motor Movements are Fluid and Coordinated: No Coordination and Movement Description: effects of pain, amputation and B sensory deficits as well as prolonged bed rest and debility Heel Shin Test: unable d/t BKA and overall  debility/ weakness Motor  Motor Motor: Other (comment) Motor - Skilled Clinical Observations:  significant deficits due to effects of prolonged debility and pain   Trunk/Postural Assessment  Thoracic Assessment Thoracic Assessment: Exceptions to [facility] Lumbar Assessment Lumbar Assessment: Within Functional Limits Lumbar Strength Overall Lumbar Strength: Deficits Overall Lumbar Strength Comments: reduced d/t debility and generalized weakness Postural Control Protective Responses: delayed Postural Limitations: due to trunk and hip weakness  Balance Balance Balance Assessed: Yes Static Sitting Balance Static Sitting - Balance Support: Bilateral upper extremity supported;Feet supported Static Sitting - Level of Assistance: 5: Stand by assistance Dynamic Sitting Balance Dynamic Sitting - Balance Support: Bilateral upper extremity supported;Feet supported;During functional activity Dynamic Sitting - Level of Assistance: 4: Min assist;Other (comment) (CGA) Sitting balance - Comments: able to balance sitting EOB only with support Extremity Assessment      RLE Assessment RLE Assessment: Exceptions to [facility] General Strength Comments: grossly 3+ to 4-/ 5 LLE Assessment LLE Assessment: Exceptions to [facility] General Strength Comments: grossly 3-/ 5  Care Tool Care Tool Bed Mobility Roll left and right activity   Roll left and right assist level: Moderate Assistance - Patient 50 - 74%    Sit to lying activity   Sit to lying assist level: Maximal Assistance - Patient 25 - 49%    Lying to sitting on side of bed activity   Lying to sitting on side of bed assist level: the ability to move from lying on the back to sitting on the side of the bed with no back support.: Maximal Assistance - Patient 25 - 49%     Care Tool Transfers Sit to stand transfer   Sit to stand assist level: Dependent - Patient 0%    Chair/bed transfer   Chair/bed transfer assist level: Dependent - mechanical lift      Toilet transfer Toilet transfer activity did not occur: Safety/medical concerns      Licensed conveyancer transfer activity did not occur: Safety/medical concerns        Care Tool Locomotion Ambulation Ambulation activity did not occur: Safety/medical concerns        Walk 10 feet activity Walk 10 feet activity did not occur: Safety/medical concerns       Walk 50 feet with 2 turns activity Walk 50 feet with 2 turns activity did not occur: Safety/medical concerns      Walk 150 feet activity Walk 150 feet activity did not occur: Safety/medical concerns      Walk 10 feet on uneven surfaces activity Walk 10 feet on uneven surfaces activity did not occur: Safety/medical concerns      Stairs Stair activity did not occur: Safety/medical concerns        Walk up/down 1 step activity Walk up/down 1 step or curb (drop down) activity did not occur: Safety/medical concerns      Walk up/down 4 steps activity Walk up/down 4 steps activity did not occur: Safety/medical concerns      Walk up/down 12 steps activity Walk up/down 12 steps activity did not occur: Safety/medical concerns      Pick up small objects from floor Pick up small object from the floor (from standing position) activity did not occur: Safety/medical concerns      Wheelchair Is the patient using a wheelchair?: Yes Type of Wheelchair: Manual   Wheelchair assist level: Dependent - Patient 0% Max wheelchair distance: 10 ft  Wheel 50 feet with 2 turns activity   Assist Level: Dependent - Patient 0%  Wheel 150 feet activity   Assist Level: Dependent - Patient 0%    Refer  to Care Plan for Long Term Goals  SHORT TERM GOAL WEEK 1 PT Short Term Goal 1 (Week 1): Pt will perform rolling in bed to either side with MinA. PT Short Term Goal 2 (Week 1): Pt will perform supine <> sit with overall ModA. PT Short Term Goal 3 (Week 1): Pt will perform sit<>stand with MaxA +2 and tolerate stance for >20sec. PT Short Term Goal 4  (Week 1): Pt will initiate slide board transfer training. PT Short Term Goal 5 (Week 1): Pt will complete FIST for outcome measure.  Recommendations for other services: Neuropsych and Therapeutic Recreation  Pet therapy and Stress management  Skilled Therapeutic Intervention Mobility Bed Mobility Bed Mobility: Rolling Right;Rolling Left;Left Sidelying to Sit;Sit to Sidelying Left Rolling Right: Maximal Assistance - Patient 25-49%;Moderate Assistance - Patient 50-74% Rolling Left: Minimal Assistance - Patient > 75% Left Sidelying to Sit: Maximal Assistance - Patient 25-49%;Moderate Assistance - Patient 50-74% Sit to Sidelying Left: Maximal Assistance - Patient 25-49% Transfers Transfers: Transfer via Financial trader;Sit to Stand;Stand to Sit Sit to Stand: Dependent - Patient 0% Stand to Sit: Dependent - Patient equal 0% Transfer via Lift Equipment: Maximove;Stedy Locomotion  Gait Ambulation: No Gait Gait: No Stairs / Additional Locomotion Stairs: No Wheelchair Mobility Wheelchair Mobility: No  Skilled Intervention: PT Evaluation completed; see above for results. PT educated patient in roles of PT vs OT, PT POC, rehab potential, rehab goals, and discharge recommendations along with recommendation for follow-up rehabilitation services. Individual treatment initiated:  Patient supine in bed upon PT arrival. Patient alert and agreeable to PT session. Min/ moderate pain complaint at start of session at wound site on leg.   Therapeutic Activity: Bed Mobility: Patient performed supine --> sit with vc for log roll to sidelying then push up to seated position on EOB. She requires MinA for roll to L side with RLE assisting with anterior positioning to LLE to initiate hip roll. IS able to pull at bedrail. Increased effort given to push UB from bed surface with CGA to elbow, then requiring ModA to reach upright seated position EOB. Is able to scoot forward with CGA then needs MinA to complete d/t  fatigue. L foot on floor. BUE to bed surface for managing balance. Return to supine with sidelying technique and requires Mod/ MaxA for bringing BLE to bed surface and roll to back. Is able to utilize LUE to assist move toward [facility] using bed features for boost assist. Completes with MinA.  Transfers: Attempted sit <> stand transfers using STEDY from elevated bed height and pt unable to rise from bed with MaxA +1. Provided vc/tc for forward lean after education with verbal instructions and visual demonstration re: biomechanics of rising to stand and need for anterior weight shift. Four attempts with pt scared to lean over bar, unable to utilize LLE for push into extension d/t debility/ weakness.   Appropriately sized w/c acquired for pt, high back with recline in order to adjust pressure to sacrum while seated upright.   Patient supine  in bed at end of session with brakes locked, bed alarm set, and all needs within reach. Pt oriented to time and time of next therapy session with OT in one hour.   Discharge Criteria: Patient will be discharged from PT if patient refuses treatment 3 consecutive times without medical reason, if treatment goals not met, if there is a change in medical status, if patient makes no progress towards goals or if patient is discharged from hospital.  The above  assessment, treatment plan, treatment alternatives and goals were discussed and mutually agreed upon: by patient  Loel Dubonnet PT, DPT, CSRS 11/21/2022, 6:44 PM

## 2022-11-21 NOTE — Progress Notes (Signed)
Physical Therapy Session Note  Patient Details  Name: Susan [name] MRN: [id number] Date of Birth: 03-14-1951  Today's Date: 11/21/2022 PT Individual Time: 1332-1445 PT Individual Time Calculation (min): 73 min   Short Term Goals: Week 1:  PT Short Term Goal 1 (Week 1): Pt will perform rolling in bed to either side with MinA. PT Short Term Goal 2 (Week 1): Pt will perform supine <> sit with overall ModA. PT Short Term Goal 3 (Week 1): Pt will perform sit<>stand with MaxA +2 and tolerate stance for >20sec. PT Short Term Goal 4 (Week 1): Pt will initiate slide board transfer training. PT Short Term Goal 5 (Week 1): Pt will complete FIST for outcome measure.  Skilled Therapeutic Interventions/Progress Updates:  Patient seated upright in w/c on entrance to room. Patient alert and agreeable to PT session. Is adamant that she needs to get back in bed as she can no longer tolerate sitting upright in w/c.   Patient with moderate pain complaint at start of session d/t time spent seated upright in w/c and increasing pain in low back and sacrum.   Therapeutic Activity: Nurse in room to perform wound care to LLE. Pt requires PT assist in order to complete while seated in w/c. For appropriate access to wound, pt encouraged to hold L knee in full extension throughout treatment. With fatigue, pt requires physical assist to hold LE in appropriate position to complete wound care.  Following wound care, pt performs anterior lean in w/c in order to promote increased stength in abdominals as well as to increase pt's ability to tolerate more forward UB positioning to prep for future technique to improve rise to stand. During activity, lab tech arrives for blood draw for testing. Pt does not tolerate well and requires assist from PT for distraction and calming.  Transfers:  [employer] transfer completed with TotA from w/c to supine in bed. Pt tolerates well.  Bed Mobility: Bed roll to L with MinA and to R with  ModA for bringing LLE over RLE.VC with tc for technique. Maxi Move sling removed. Pt performed move toward [facility] for improved positioning in bed. As in previous session, she is able to use LUE to pull at bedrail to assist and completes with boost assist bed feature as well as MinA from this therapist. VC/ tc required for technique, effort, and initiation.  Patient supine in bed at end of session with brakes locked, bed alarm set, and all needs within reach.   Therapy Documentation Precautions:  Restrictions Weight Bearing Restrictions: Yes LLE Weight Bearing: Weight bearing as tolerated General:   Vital Signs: Therapy Vitals Temp: 98 F (36.7 C) Temp Source: Oral Pulse Rate: 62 Resp: 16 BP: (!) 110/57 Patient Position (if appropriate): Lying Oxygen Therapy SpO2: 100 % O2 Device: Room Air Pain: Pt relates 5/10 pain during wound dressing change. Then no complaint of pain during session.   Therapy/Group: Individual Therapy  Loel Dubonnet PT, DPT, CSRS 11/21/2022, 8:30 AM

## 2022-11-21 NOTE — Progress Notes (Signed)
[facility] Individual Statement of Services  Patient Name:  Susan [name]  Date:  11/21/2022  Welcome to the [facility]patient [facility].  Our goal is to provide you with an individualized program based on your diagnosis and situation, designed to meet your specific needs.  With this comprehensive rehabilitation program, you will be expected to participate in at least 3 hours of rehabilitation therapies Monday-Friday, with modified therapy programming on the weekends.  Your rehabilitation program will include the following services:  Physical Therapy (PT), Occupational Therapy (OT), 24 hour per day rehabilitation nursing, Therapeutic Recreaction (TR), Neuropsychology, Care Coordinator, Rehabilitation Medicine, Nutrition Services, and Pharmacy Services  Weekly team conferences will be held on Wednesday to discuss your progress.  Your Inpatient Rehabilitation Care Coordinator will talk with you frequently to get your input and to update you on team discussions.  Team conferences with you and your family in attendance may also be held.  Expected length of stay: 3 weeks  Overall anticipated outcome: min/mod level  Depending on your progress and recovery, your program may change. Your Inpatient Rehabilitation Care Coordinator will coordinate services and will keep you informed of any changes. Your Inpatient Rehabilitation Care Coordinator's name and contact numbers are listed  below.  The following services may also be recommended but are not provided by the Inpatient Rehabilitation Center:   Home Health Rehabiltiation Services Outpatient Rehabilitation Services    Arrangements will be made to provide these services after discharge if needed.  Arrangements include referral to agencies that provide these services.  Your insurance has been verified to be:  [employer] Your primary doctor is:  Lynnea Ferrier  Pertinent information will be shared with your doctor  and your insurance company.  Inpatient Rehabilitation Care Coordinator:  Dossie Der, [place] [phone] or Luna Glasgow  Information discussed with and copy given to patient by: Lucy Chris, 11/21/2022, 10:57 AM

## 2022-11-21 NOTE — Plan of Care (Signed)
  Problem: RH Balance Goal: LTG: Patient will maintain dynamic sitting balance (OT) Description: LTG:  Patient will maintain dynamic sitting balance with assistance during activities of daily living (OT) Flowsheets (Taken 11/21/2022 1234) LTG: Pt will maintain dynamic sitting balance during ADLs with: Supervision/Verbal cueing   Problem: Sit to Stand Goal: LTG:  Patient will perform sit to stand in prep for activites of daily living with assistance level (OT) Description: LTG:  Patient will perform sit to stand in prep for activites of daily living with assistance level (OT) Flowsheets (Taken 11/21/2022 1236) LTG: PT will perform sit to stand in prep for activites of daily living with assistance level: Moderate Assistance - Patient 50 - 74%   Problem: RH Grooming Goal: LTG Patient will perform grooming w/assist,cues/equip (OT) Description: LTG: Patient will perform grooming with assist, with/without cues using equipment (OT) Flowsheets (Taken 11/21/2022 1236) LTG: Pt will perform grooming with assistance level of: Supervision/Verbal cueing   Problem: RH Bathing Goal: LTG Patient will bathe all body parts with assist levels (OT) Description: LTG: Patient will bathe all body parts with assist levels (OT) Flowsheets (Taken 11/21/2022 1236) LTG: Pt will perform bathing with assistance level/cueing: Moderate Assistance - Patient 50 - 74%   Problem: RH Dressing Goal: LTG Patient will perform upper body dressing (OT) Description: LTG Patient will perform upper body dressing with assist, with/without cues (OT). Flowsheets (Taken 11/21/2022 1236) LTG: Pt will perform upper body dressing with assistance level of: Supervision/Verbal cueing   Problem: RH Dressing Goal: LTG Patient will perform lower body dressing w/assist (OT) Description: LTG: Patient will perform lower body dressing with assist, with/without cues in positioning using equipment (OT) Flowsheets (Taken 11/21/2022 1236) LTG: Pt will  perform lower body dressing with assistance level of: Minimal Assistance - Patient > 75%   Problem: RH Toileting Goal: LTG Patient will perform toileting task (3/3 steps) with assistance level (OT) Description: LTG: Patient will perform toileting task (3/3 steps) with assistance level (OT)  Flowsheets (Taken 11/21/2022 1236) LTG: Pt will perform toileting task (3/3 steps) with assistance level: Moderate Assistance - Patient 50 - 74%   Problem: RH Toilet Transfers Goal: LTG Patient will perform toilet transfers w/assist (OT) Description: LTG: Patient will perform toilet transfers with assist, with/without cues using equipment (OT) Flowsheets (Taken 11/21/2022 1236) LTG: Pt will perform toilet transfers with assistance level of: Moderate Assistance - Patient 50 - 74%   Problem: RH Tub/Shower Transfers Goal: LTG Patient will perform tub/shower transfers w/assist (OT) Description: LTG: Patient will perform tub/shower transfers with assist, with/without cues using equipment (OT) Flowsheets (Taken 11/21/2022 1236) LTG: Pt will perform tub/shower stall transfers with assistance level of: Maximal Assistance - Patient 25 - 49%

## 2022-11-22 DIAGNOSIS — S81802D Unspecified open wound, left lower leg, subsequent encounter: Secondary | ICD-10-CM

## 2022-11-22 DIAGNOSIS — Z89511 Acquired absence of right leg below knee: Secondary | ICD-10-CM | POA: Diagnosis not present

## 2022-11-22 LAB — GLUCOSE, CAPILLARY
Glucose-Capillary: 222 mg/dL — ABNORMAL HIGH (ref 70–99)
Glucose-Capillary: 131 mg/dL — ABNORMAL HIGH (ref 70–99)
Glucose-Capillary: 183 mg/dL — ABNORMAL HIGH (ref 70–99)
Glucose-Capillary: 224 mg/dL — ABNORMAL HIGH (ref 70–99)

## 2022-11-22 LAB — CBC
HCT: 27.8 % — ABNORMAL LOW (ref 36.0–46.0)
Hemoglobin: 8.2 g/dL — ABNORMAL LOW (ref 12.0–15.0)
MCH: 24.1 pg — ABNORMAL LOW (ref 26.0–34.0)
MCHC: 29.5 g/dL — ABNORMAL LOW (ref 30.0–36.0)
MCV: 81.8 fL (ref 80.0–100.0)
Platelets: 177 10*3/uL (ref 150–400)
RBC: 3.4 MIL/uL — ABNORMAL LOW (ref 3.87–5.11)
RDW: 17.5 % — ABNORMAL HIGH (ref 11.5–15.5)
WBC: 6.7 10*3/uL (ref 4.0–10.5)
nRBC: 0 % (ref 0.0–0.2)

## 2022-11-22 LAB — RENAL FUNCTION PANEL
Albumin: 2.6 g/dL — ABNORMAL LOW (ref 3.5–5.0)
Anion gap: 10 (ref 5–15)
BUN: 49 mg/dL — ABNORMAL HIGH (ref 8–23)
CO2: 27 mmol/L (ref 22–32)
Calcium: 9.2 mg/dL (ref 8.9–10.3)
Chloride: 96 mmol/L — ABNORMAL LOW (ref 98–111)
Creatinine, Ser: 4.34 mg/dL — ABNORMAL HIGH (ref 0.44–1.00)
GFR, Estimated: 10 mL/min — ABNORMAL LOW (ref >60)
Glucose, Bld: 273 mg/dL — ABNORMAL HIGH (ref 70–99)
Phosphorus: 4.3 mg/dL (ref 2.5–4.6)
Potassium: 4.1 mmol/L (ref 3.5–5.1)
Sodium: 133 mmol/L — ABNORMAL LOW (ref 135–145)

## 2022-11-22 LAB — HEPATITIS B SURFACE ANTIBODY, QUANTITATIVE: Hep B S AB Quant (Post): 2434 m[IU]/mL (ref >9.9)

## 2022-11-22 MED ORDER — MELATONIN 5 MG PO TABS: 5.0000 mg | ORAL_TABLET | Freq: Every evening | ORAL | Status: DC | PRN

## 2022-11-22 MED ORDER — LIDOCAINE HCL (PF) 1 % IJ SOLN: 5.0000 mL | INTRAMUSCULAR | Status: DC | PRN

## 2022-11-22 MED ORDER — HEPARIN SODIUM (PORCINE) 1000 UNIT/ML DIALYSIS
1000.0000 [IU] | INTRAMUSCULAR | Status: DC | PRN
  Administered 2022-11-22: 3800 [IU]
  Filled 2022-11-22: qty 1

## 2022-11-22 MED ORDER — PENTAFLUOROPROP-TETRAFLUOROETH EX AERO: 1.0000 | INHALATION_SPRAY | CUTANEOUS | Status: DC | PRN

## 2022-11-22 MED ORDER — HEPARIN SODIUM (PORCINE) 1000 UNIT/ML DIALYSIS
4000.0000 [IU] | Freq: Once | INTRAMUSCULAR | Status: AC
  Administered 2022-11-22: 4000 [IU] via INTRAVENOUS_CENTRAL
  Filled 2022-11-22: qty 4

## 2022-11-22 MED ORDER — LIDOCAINE-PRILOCAINE 2.5-2.5 % EX CREA: 1.0000 | TOPICAL_CREAM | CUTANEOUS | Status: DC | PRN

## 2022-11-22 MED ORDER — ANTICOAGULANT SODIUM CITRATE 4% (200MG/5ML) IV SOLN: 5.0000 mL | Status: DC | PRN

## 2022-11-22 MED ORDER — ALTEPLASE 2 MG IJ SOLR: 2.0000 mg | Freq: Once | INTRAMUSCULAR | Status: DC | PRN

## 2022-11-22 MED ORDER — LORAZEPAM 0.5 MG PO TABS
0.5000 mg | ORAL_TABLET | Freq: Every evening | ORAL | Status: DC | PRN
  Administered 2022-11-22: 0.5 mg via ORAL
  Filled 2022-11-22: qty 1

## 2022-11-22 NOTE — Progress Notes (Signed)
PROGRESS NOTE   Subjective/Complaints:  Pt doing well, slept better overnight. Supposed to have lorazepam 0.5mg  at PRN but doesn't appear to have been ordered. Has trouble getting back to sleep when she gets awoken.  Pain overall well controlled.  LBM yesterday, soft.  Still makes a little urine, last time yesterday. Just got dialyzed today.  Denies any other complaints or concerns today.  Nursing reporting that LLE wound with more drainage/slough today.   ROS: Denies fever or chills, chest pain, shortness of breath, abd pain, nausea, vomiting, diarrhea, constipation, new changes in motor or sensory function  Objective:   No results found. Recent Labs    11/20/22 0116 11/22/22 0920  WBC 5.9 6.7  HGB 8.5* 8.2*  HCT 28.2* 27.8*  PLT 187 177   Recent Labs    11/20/22 0116 11/22/22 0920  NA 132* 133*  K 4.2 4.1  CL 95* 96*  CO2 26 27  GLUCOSE 330* 273*  BUN 35* 49*  CREATININE 3.63* 4.34*  CALCIUM 8.9 9.2    Intake/Output Summary (Last 24 hours) at 11/22/2022 1421 Last data filed at 11/22/2022 1335 Gross per 24 hour  Intake 120 ml  Output 2500 ml  Net -2380 ml     Pressure Injury 05/23/22 Buttocks Right Deep Tissue Pressure Injury - Purple or maroon localized area of discolored intact skin or blood-filled blister due to damage of underlying soft tissue from pressure and/or shear. (Active)  05/23/22 1715  Location: Buttocks  Location Orientation: Right  Staging: Deep Tissue Pressure Injury - Purple or maroon localized area of discolored intact skin or blood-filled blister due to damage of underlying soft tissue from pressure and/or shear.  Wound Description (Comments):   Present on Admission: Yes     Pressure Injury 08/27/22 Coccyx Mid Stage 2 -  Partial thickness loss of dermis presenting as a shallow open injury with a red, pink wound bed without slough. 53mmx5mmx0 red center unattached edges (Active)   08/27/22 1000  Location: Coccyx  Location Orientation: Mid  Staging: Stage 2 -  Partial thickness loss of dermis presenting as a shallow open injury with a red, pink wound bed without slough.  Wound Description (Comments): 63mmx5mmx0 red center unattached edges  Present on Admission:     Physical Exam: Vital Signs Blood pressure (!) 119/54, pulse 66, temperature 98 F (36.7 C), resp. rate 15, height 5\' 5"  (1.651 m), weight 96.2 kg, SpO2 100 %.  Physical Exam Constitutional: No apparent distress.  Laying in bed, obese HENT: [place], AT, MMM Eyes: PERRLA. EOMI. Visual fields grossly intact.  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  On room air, no increased WOB Abdomen: Soft, nontender, nondistended, +bowel sounds Skin:  + Right BKA dry dressing in place + Left calf wound wrapped in Ace bandage, dressing removed, some yellowish slough on dressing and wound bed, minimal erythema surrounding wound but no red streaking, no significant warmth. TTP around the wound, but seems appropriate. SEE PICTURE:     PRIOR EXAMS: MSK: No abnormal tone noted      + Active range of motion in right shoulder limited in < 90 degrees flexion,  abduction due to prior unrepaired shoulder dislocation      Strength:                RUE: 4/5 elbow flexion, extension, wrist, finger flexion, finger abduction                LUE: 4 out of 5 throughout                  LLE: 1/5 HF, 4/5 KE, 3/5 DF, 3/5 EHL, 4/5 PF                 RLE:  4/5 HF, 4/5 KE    Neurologic exam: Alert and awake, decreased sensation below distal thighs, cranial nerves II through XII grossly intact Right IJ TDC in place  Assessment/Plan: 1. Functional deficits which require 3+ hours per day of interdisciplinary therapy in a comprehensive inpatient rehab setting. Physiatrist is providing close team supervision and 24 hour management of active medical problems listed below. Physiatrist and rehab  team continue to assess barriers to discharge/monitor patient progress toward functional and medical goals  Care Tool:  Bathing    Body parts bathed by patient: Right arm, Left arm, Chest, Face   Body parts bathed by helper: Buttocks, Front perineal area, Abdomen, Right upper leg, Left upper leg Body parts n/a: Right lower leg, Left lower leg   Bathing assist Assist Level: Dependent - Patient 0%     Upper Body Dressing/Undressing Upper body dressing   What is the patient wearing?: Hospital gown only    Upper body assist Assist Level: Maximal Assistance - Patient 25 - 49%    Lower Body Dressing/Undressing Lower body dressing      What is the patient wearing?: Incontinence brief, Hospital gown only, Ace wrap/stump shrinker     Lower body assist Assist for lower body dressing: Dependent - Patient 0%     Toileting Toileting    Toileting assist Assist for toileting: Dependent - Patient 0%     Transfers Chair/bed transfer  Transfers assist     Chair/bed transfer assist level: Dependent - mechanical lift     Locomotion Ambulation   Ambulation assist   Ambulation activity did not occur: Safety/medical concerns          Walk 10 feet activity   Assist  Walk 10 feet activity did not occur: Safety/medical concerns        Walk 50 feet activity   Assist Walk 50 feet with 2 turns activity did not occur: Safety/medical concerns         Walk 150 feet activity   Assist Walk 150 feet activity did not occur: Safety/medical concerns         Walk 10 feet on uneven surface  activity   Assist Walk 10 feet on uneven surfaces activity did not occur: Safety/medical concerns         Wheelchair     Assist Is the patient using a wheelchair?: Yes Type of Wheelchair: Manual    Wheelchair assist level: Dependent - Patient 0% Max wheelchair distance: 10 ft    Wheelchair 50 feet with 2 turns activity    Assist        Assist Level:  Dependent - Patient 0%   Wheelchair 150 feet activity     Assist      Assist Level: Dependent - Patient 0%   Blood pressure (!) 119/54, pulse 66, temperature 98 F (36.7 C), resp. rate 15, height 5\' 5"  (1.651 m), weight  96.2 kg, SpO2 100 %.  Medical Problem List and Plan: 1. Functional deficits secondary to right BKA revision 1/24 and left leg wound             -patient may shower with wound covered             -ELOS/Goals: 10-14 days, min to mod assist PT/OT -Was preparing for initial prosthetic fitting with [employer] prior to admission, they are aware she is coming to rehab, would coordinate trial of prosthetic device while at inpatient rehab   -Continue CIR 2.  Antithrombotics: -DVT/anticoagulation:  Pharmaceutical: Eliquis 5mg  BID             -antiplatelet therapy: N/A 3. Pain Management:  Tylenol, Robaxin, and Oxycodone prn.  -Patient had altered mental status and twitching suspected due to Lyrica toxicity prior to admission to CIR.  Lyrica was discontinued. 4. Mood/Behavior/Sleep: LCSW to follow for evaluation and support.  --chronic insomnia--has Ambien prn w/ Ramelteron 8mg  QHS, and Lorazepam. -11/22/22 ambien not on orders; Lorazepam also not on orders, but will reorder 0.5mg  QHS PRN, also melatonin 5mg  QHS PRN, will hold off on ambien for now.              -antipsychotic agents: N/A 5. Neuropsych/cognition: This patient is capable of making decisions on her own behalf. 6. Skin/Wound Care: Monitor wound for healing.  --Wound VAC d/c due to concerns of wound getting macerated per Dr. Lajoyce Corners.  --Dry dressing changes and to add silvadene to wound bed if wound starts getting dry.  -6/28 VAC was removed last night Dr. Lajoyce Corners advises to "Apply small amount of silvadine pulse gauze and ace wrap. Change daily " -11/22/22 yellowish slough/drainage from wound bed, contacted Dr. Lajoyce Corners regarding this, will see if he wants BID dressing changes-- still pending response. Will f/up tomorrow. For  now, doesn't look like worsening infection, can monitor closely. Will undress wound and re-photograph tomorrow 7. Fluids/Electrolytes/Nutrition:  Strict I/O with 1200 cc FR. Monitor intake. Continue supplements/vitamins.  8. Kleb/Proteus/Enterococcus wound infection: Cefazolin for 2 weeks with EOT 11/29/22 per Dr. Thedore Mins 9.  T2DM: Hgb A1c-7.9 08/27/22 -Monitor BS ac/hs and use SSI for elevated BS. Titrate basal insulin as indicated.  -6/28 she is on Semglee 40 units in the morning and 20 units at bedtime.  Increase bedtime Semglee to 22 units -11/22/22 CBGs downtrending, monitor for now with recent changes.  CBG (last 3)  Recent Labs    11/21/22 2056 11/22/22 0607 11/22/22 1407  GLUCAP 250* 222* 131*    10. CAD/biventricular CHF: On GDMT limited due to hypotension.   --Continue Lipitor 80mg  QD   11. ESRD: HD TTS at the end of the day to help with tolerance of therapy.  Thrombosed AVF not salvageable.                --has been on regular diet-->? Low salt restrictions.   -6/28 nephrology following appreciate assistance, next HD tomorrow 12. Depression with anxiety d/o, panic attacks: Now on Venlafaxine XR 75mg  QD with Lorazepam at night.--lorazepam not ordered, will reorder 0.5mg  QHS PRN  13. PAF: On Eliquis and amiodarone 200mg  QD for rate control.   -6/28-29 heart rate controlled     11/22/2022    2:12 PM 11/22/2022    1:35 PM 11/22/2022    1:24 PM  Vitals with BMI  Weight  212 lbs 1 oz   BMI  35.29   Systolic 119 113 161  Diastolic 54 85 28  Pulse 66 62  57    14. Anemia of chronic disease:  -11/22/22 Hgb stable at 8.2 today, monitor labs 15.   H/o GIB due to stercoral ulcer: Avoid constipation. Continue Protonix 40mg  BID, miralax QD , Senokot 2 tabs BID, and PRNs.   -11/22/22 last BM yesterday, soft, monitor 16. Morbid Obesity. Dietary education  -Body mass index is 35.29 kg/m. 17.  Hypotension  -Taking midodrine 10mg  prior to dialysis  -11/22/22 BPs stable, monitor Vitals:    11/22/22 0924 11/22/22 0930 11/22/22 1000 11/22/22 1030  BP: (!) 108/49 (!) 108/49 (!) 116/48 136/76   11/22/22 1100 11/22/22 1130 11/22/22 1200 11/22/22 1230  BP: (!) 107/34 (!) 112/36 (!) 113/29 (!) 108/28   11/22/22 1300 11/22/22 1324 11/22/22 1335 11/22/22 1412  BP: (!) 109/31 (!) 103/28 113/85 (!) 119/54    18. Hypothyroidism: continue synthroid qAM    LOS: 2 days A FACE TO FACE EVALUATION WAS PERFORMED  [address] 11/22/2022, 2:21 PM

## 2022-11-22 NOTE — Progress Notes (Signed)
[facility] Progress Note   Subjective: Seen prior to HD. No C/Os. No issues reported overnight. UF as tolerated.     Objective Vitals:   11/21/22 0514 11/21/22 1315 11/21/22 1940 11/22/22 0343  BP: (!) 110/57 (!) 129/44 (!) 103/42 (!) 117/52  Pulse: 62 69 64 (!) 59  Resp: 16 17 18 18   Temp: 98 F (36.7 C) 98.1 F (36.7 C) 97.6 F (36.4 C) 98.3 F (36.8 C)  TempSrc: Oral Oral  Oral  SpO2: 100% 95% 100% 100%  Weight:      Height:       Physical Exam General: Elderly female in NAD Heart: S1,S2 RRR No M/R Lungs: CTAB Abdomen: obese, NABS Extremities: LLE with dressing in place. Edema in hips. R BKA.  Dialysis Access: RIJ [facility] drsg intact    Additional Objective Labs: Basic Metabolic Panel: Recent Labs  Lab 11/17/22 0739 11/18/22 0151 11/19/22 0205 11/20/22 0116  NA 133* 134* 131* 132*  K 3.9 4.2 3.9 4.2  CL 98 97* 95* 95*  CO2 21* 23 25 26   GLUCOSE 80 85 207* 330*  BUN 60* 70* 42* 35*  CREATININE 4.84* 5.43* 3.97* 3.63*  CALCIUM 9.0 8.7* 8.4* 8.9  PHOS 6.4* 6.5* 4.6  --    Liver Function Tests: Recent Labs  Lab 11/17/22 0739 11/18/22 0151 11/19/22 0205  ALBUMIN 2.8* 2.6* 2.5*   No results for input(s): "LIPASE", "AMYLASE" in the last 168 hours. CBC: Recent Labs  Lab 11/16/22 0152 11/17/22 0739 11/18/22 0151 11/19/22 0205 11/20/22 0116  WBC 5.3 5.5 5.7 4.8 5.9  NEUTROABS  --  3.7  --   --   --   HGB 8.5* 9.1* 8.7* 8.4* 8.5*  HCT 27.0* 30.3* 29.2* 28.0* 28.2*  MCV 78.3* 81.5 79.3* 80.7 81.5  PLT 159 171 167 174 187   Blood Culture    Component Value Date/Time   SDES TISSUE 11/14/2022 0919   SPECREQUEST NONE 11/14/2022 0919   CULT  11/14/2022 0919    FEW PROTEUS MIRABILIS RARE KLEBSIELLA PNEUMONIAE NO ANAEROBES ISOLATED FEW ENTEROCOCCUS FAECALIS    REPTSTATUS 11/19/2022 FINAL 11/14/2022 0919    Cardiac Enzymes: No results for input(s): "CKTOTAL", "CKMB", "CKMBINDEX", "TROPONINI" in the last 168 hours. CBG: Recent Labs   Lab 11/21/22 0621 11/21/22 1132 11/21/22 1631 11/21/22 2056 11/22/22 0607  GLUCAP 156* 212* 267* 250* 222*   Iron Studies: No results for input(s): "IRON", "TIBC", "TRANSFERRIN", "FERRITIN" in the last 72 hours. @lablastinr3 @ Studies/Results: No results found. Medications:  anticoagulant sodium citrate      ceFAZolin (ANCEF) IV      (feeding supplement) PROSource Plus  30 mL Oral BID BM   amiodarone  200 mg Oral Daily   apixaban  5 mg Oral BID   ascorbic acid  500 mg Oral Daily   atorvastatin  80 mg Oral Daily   Chlorhexidine Gluconate Cloth  6 each Topical BID   feeding supplement (NEPRO CARB STEADY)  237 mL Oral BID BM   folic acid  1 mg Oral Daily   heparin  4,000 Units Dialysis Once in dialysis   insulin aspart  0-6 Units Subcutaneous TID WC   insulin glargine-yfgn  22 Units Subcutaneous QHS   insulin glargine-yfgn  40 Units Subcutaneous Daily   levothyroxine  75 mcg Oral QAC breakfast   midodrine  10 mg Oral Q T,Th,Sa-HD   multivitamin  1 tablet Oral QHS   pantoprazole  40 mg Oral BID AC   polyethylene glycol  17  g Oral Daily   ramelteon  8 mg Oral QHS   senna-docusate  2 tablet Oral BID   sevelamer carbonate  1,600 mg Oral TID WC   silver sulfADIAZINE   Topical Daily   venlafaxine XR  75 mg Oral Q breakfast   zinc sulfate  220 mg Oral Daily     Dialysis Orders: [facility] TTS 4:00 400/800 EDW 91.5kg 2K/2Ca TDC  -Heparin 4000 units IV TIW -Mircera 75 mcg IV q 2 weeks  - last 6/8 -Calcitriol 0.5 three times per week     Assessment/Plan: Infected left lower extremity wound - maggot infestation on admission. Blood cultures neg. Ortho following - s/p OR debridement/wound vac on 6/21. Wound cultures+ proteus/klebsiella --Continue cefazolin 2g qHD until 11/28/22 ESRD -  HD TTS. Next HD 11/25/2022 Thrombosed AVF - not salvageable. For access surgery next month  Hypotension/volume  - BP soft during admission. On midodrine pre HD. Albumin prn. Does have some volume on exam.  UF as able.  Anemia  - Hgb 8.5. Received Aranesp 150 on 6/23.  Metabolic bone disease -  Ca/Phos acceptable. Continue sevelamer binders with diet.  Nutrition - Renal diet with fluid restriction. + Prot supp.  AFib - on amiodarone/Eliquis  T2DM - historically poorly controlled. Insulin per primary AMS/Twitching - felt secondary to Lyrica toxicity in ESRD pt. Discontinued - continue to hold. Dispo - For CIR admit   [employer]. Sharrod Achille NP-C 11/22/2022, 8:50 AM  [employer] [phone]

## 2022-11-22 NOTE — Progress Notes (Signed)
Occupational Therapy Session Note  Patient Details  Name: Susan [name] MRN: [id number] Date of Birth: November 11, 1950  {CHL IP REHAB OT TIME CALCULATIONS:[id number]}   Short Term Goals: Week 1:  OT Short Term Goal 1 (Week 1): Pt will complete UB seflcare EOB unsupported with CGA OT Short Term Goal 2 (Week 1): Pt will access DABSC via TB with 2 helpers OT Short Term Goal 3 (Week 1): Pt will trial sit to stand in STEDY frame with 2 helper assist  Skilled Therapeutic Interventions/Progress Updates:  Pt received *** for skilled OT session with focus on ***. Pt agreeable to interventions, demonstrating overall *** mood. Pt reported ***/10 pain, stating "***" in reference to ***. OT offering intermediate rest breaks and positioning suggestions throughout session to address pain/fatigue and maximize participation/safety in session.    Pt remained *** with all immediate needs met at end of session. Pt continues to be appropriate for skilled OT intervention to promote further functional independence.   Therapy Documentation Precautions:  Precautions Precautions: Fall Precaution Comments: L lateral lower leg open wound, R rotator cuff tears, Restrictions Weight Bearing Restrictions: Yes LLE Weight Bearing: Weight bearing as tolerated   Therapy/Group: Individual Therapy  Lou Cal, OTR/L, MSOT  11/22/2022, 8:47 PM

## 2022-11-23 DIAGNOSIS — N179 Acute kidney failure, unspecified: Secondary | ICD-10-CM | POA: Diagnosis not present

## 2022-11-23 DIAGNOSIS — S81802D Unspecified open wound, left lower leg, subsequent encounter: Secondary | ICD-10-CM | POA: Diagnosis not present

## 2022-11-23 DIAGNOSIS — N186 End stage renal disease: Secondary | ICD-10-CM | POA: Diagnosis not present

## 2022-11-23 DIAGNOSIS — Z992 Dependence on renal dialysis: Secondary | ICD-10-CM | POA: Diagnosis not present

## 2022-11-23 DIAGNOSIS — Z89511 Acquired absence of right leg below knee: Secondary | ICD-10-CM | POA: Diagnosis not present

## 2022-11-23 LAB — GLUCOSE, CAPILLARY
Glucose-Capillary: 102 mg/dL — ABNORMAL HIGH (ref 70–99)
Glucose-Capillary: 137 mg/dL — ABNORMAL HIGH (ref 70–99)
Glucose-Capillary: 197 mg/dL — ABNORMAL HIGH (ref 70–99)
Glucose-Capillary: 225 mg/dL — ABNORMAL HIGH (ref 70–99)

## 2022-11-23 MED ORDER — LORAZEPAM 0.5 MG PO TABS
0.5000 mg | ORAL_TABLET | Freq: Every day | ORAL | Status: DC
  Administered 2022-11-23 – 2022-12-01 (×9): 0.5 mg via ORAL
  Filled 2022-11-23 (×9): qty 1

## 2022-11-23 MED ORDER — SILVER SULFADIAZINE 1 % EX CREA
TOPICAL_CREAM | Freq: Two times a day (BID) | CUTANEOUS | Status: DC
  Filled 2022-11-23: qty 85

## 2022-11-23 NOTE — Progress Notes (Signed)
PROGRESS NOTE   Subjective/Complaints:  Pt doing alright, slept poorly overnight but isn't sure what meds she was given; states she takes lorazepam every night at home so would prefer it be scheduled.  Pain overall well controlled.  LBM yesterday.  Still makes a little urine, but hasn't made any today which is normal for her after dialysis.   Denies any other complaints or concerns today.    ROS: Denies fever or chills, chest pain, shortness of breath, abd pain, nausea, vomiting, diarrhea, constipation, new changes in motor or sensory function  Objective:   No results found. Recent Labs    11/22/22 0920  WBC 6.7  HGB 8.2*  HCT 27.8*  PLT 177   Recent Labs    11/22/22 0920  NA 133*  K 4.1  CL 96*  CO2 27  GLUCOSE 273*  BUN 49*  CREATININE 4.34*  CALCIUM 9.2    Intake/Output Summary (Last 24 hours) at 11/23/2022 1105 Last data filed at 11/22/2022 1842 Gross per 24 hour  Intake 337 ml  Output 2500 ml  Net -2163 ml     Pressure Injury 05/23/22 Buttocks Right Deep Tissue Pressure Injury - Purple or maroon localized area of discolored intact skin or blood-filled blister due to damage of underlying soft tissue from pressure and/or shear. (Active)  05/23/22 1715  Location: Buttocks  Location Orientation: Right  Staging: Deep Tissue Pressure Injury - Purple or maroon localized area of discolored intact skin or blood-filled blister due to damage of underlying soft tissue from pressure and/or shear.  Wound Description (Comments):   Present on Admission: Yes     Pressure Injury 08/27/22 Coccyx Mid Stage 2 -  Partial thickness loss of dermis presenting as a shallow open injury with a red, pink wound bed without slough. 38mmx5mmx0 red center unattached edges (Active)  08/27/22 1000  Location: Coccyx  Location Orientation: Mid  Staging: Stage 2 -  Partial thickness loss of dermis presenting as a shallow open injury with a  red, pink wound bed without slough.  Wound Description (Comments): 67mmx5mmx0 red center unattached edges  Present on Admission:     Physical Exam: Vital Signs Blood pressure (!) 117/55, pulse 61, temperature 98 F (36.7 C), temperature source Oral, resp. rate 18, height 5\' 5"  (1.651 m), weight 96.2 kg, SpO2 100 %.  Physical Exam Constitutional: No apparent distress.  Laying in bed, obese HENT: [place], AT, MMM Eyes: PERRLA. EOMI. Visual fields grossly intact.  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  On 2L via [place], no increased WOB Abdomen: Soft, nontender, nondistended, +bowel sounds Skin:  + Right BKA dry dressing in place + Left calf wound wrapped in Ace bandage, dressing removed, some slight amount of yellowish slough on dressing and wound bed but less than yesterday, minimal erythema surrounding wound but no red streaking, no significant warmth. TTP around the wound, but seems appropriate and less than yesterday. Wound bed a little friable around the edges.     PRIOR EXAMS: MSK: No abnormal tone noted      + Active range of motion in right shoulder limited in < 90 degrees flexion, abduction  due to prior unrepaired shoulder dislocation      Strength:                RUE: 4/5 elbow flexion, extension, wrist, finger flexion, finger abduction                LUE: 4 out of 5 throughout                  LLE: 1/5 HF, 4/5 KE, 3/5 DF, 3/5 EHL, 4/5 PF                 RLE:  4/5 HF, 4/5 KE    Neurologic exam: Alert and awake, decreased sensation below distal thighs, cranial nerves II through XII grossly intact Right IJ TDC in place  Assessment/Plan: 1. Functional deficits which require 3+ hours per day of interdisciplinary therapy in a comprehensive inpatient rehab setting. Physiatrist is providing close team supervision and 24 hour management of active medical problems listed below. Physiatrist and rehab team continue to assess barriers to  discharge/monitor patient progress toward functional and medical goals  Care Tool:  Bathing    Body parts bathed by patient: Right arm, Left arm, Chest, Face   Body parts bathed by helper: Buttocks, Front perineal area, Abdomen, Right upper leg, Left upper leg Body parts n/a: Right lower leg, Left lower leg   Bathing assist Assist Level: Dependent - Patient 0%     Upper Body Dressing/Undressing Upper body dressing   What is the patient wearing?: Hospital gown only    Upper body assist Assist Level: Maximal Assistance - Patient 25 - 49%    Lower Body Dressing/Undressing Lower body dressing      What is the patient wearing?: Incontinence brief, Hospital gown only, Ace wrap/stump shrinker     Lower body assist Assist for lower body dressing: Dependent - Patient 0%     Toileting Toileting    Toileting assist Assist for toileting: Dependent - Patient 0%     Transfers Chair/bed transfer  Transfers assist     Chair/bed transfer assist level: Dependent - mechanical lift     Locomotion Ambulation   Ambulation assist   Ambulation activity did not occur: Safety/medical concerns          Walk 10 feet activity   Assist  Walk 10 feet activity did not occur: Safety/medical concerns        Walk 50 feet activity   Assist Walk 50 feet with 2 turns activity did not occur: Safety/medical concerns         Walk 150 feet activity   Assist Walk 150 feet activity did not occur: Safety/medical concerns         Walk 10 feet on uneven surface  activity   Assist Walk 10 feet on uneven surfaces activity did not occur: Safety/medical concerns         Wheelchair     Assist Is the patient using a wheelchair?: Yes Type of Wheelchair: Manual    Wheelchair assist level: Dependent - Patient 0% Max wheelchair distance: 10 ft    Wheelchair 50 feet with 2 turns activity    Assist        Assist Level: Dependent - Patient 0%   Wheelchair  150 feet activity     Assist      Assist Level: Dependent - Patient 0%   Blood pressure (!) 117/55, pulse 61, temperature 98 F (36.7 C), temperature source Oral, resp. rate 18, height 5\' 5"  (1.651  m), weight 96.2 kg, SpO2 100 %.  Medical Problem List and Plan: 1. Functional deficits secondary to right BKA revision 1/24 and left leg wound             -patient may shower with wound covered             -ELOS/Goals: 10-14 days, min to mod assist PT/OT -Was preparing for initial prosthetic fitting with [employer] prior to admission, they are aware she is coming to rehab, would coordinate trial of prosthetic device while at inpatient rehab   -Continue CIR 2.  Antithrombotics: -DVT/anticoagulation:  Pharmaceutical: Eliquis 5mg  BID             -antiplatelet therapy: N/A 3. Pain Management:  Tylenol, Robaxin, and Oxycodone prn.  -Patient had altered mental status and twitching suspected due to Lyrica toxicity prior to admission to CIR.  Lyrica was discontinued. 4. Mood/Behavior/Sleep: LCSW to follow for evaluation and support.  --chronic insomnia--has Ambien prn w/ Ramelteron 8mg  QHS, and Lorazepam. -11/22/22 ambien not on orders; Lorazepam also not on orders, but will reorder 0.5mg  QHS PRN, also melatonin 5mg  QHS PRN, will hold off on ambien for now.  -11/23/22 poor sleep again, will schedule lorazepam for at bedtime, leave melatonin PRN to try, cont holding off on ambien for now but may want to consider restarting if sleep continues to be an issue.              -antipsychotic agents: N/A 5. Neuropsych/cognition: This patient is capable of making decisions on her own behalf. -11/23/22 pt requesting spiritual care/chaplain, ordered for this; also may benefit from neuropsych consult this week-- defer to weekday team 6. Skin/Wound Care: Monitor wound for healing.  --Wound VAC d/c due to concerns of wound getting macerated per Dr. Lajoyce Corners.  --Dry dressing changes and to add silvadene to wound bed if  wound starts getting dry.  -6/28 VAC was removed last night Dr. Lajoyce Corners advises to "Apply small amount of silvadine pulse gauze and ace wrap. Change daily " -11/22/22 yellowish slough/drainage from wound bed, contacted Dr. Lajoyce Corners regarding this, will see if he wants BID dressing changes-- still pending response. Will f/up tomorrow. For now, doesn't look like worsening infection, can monitor closely. Will undress wound and re-photograph tomorrow -11/23/22 wound looks about the same or maybe a little better today, Dr. Lajoyce Corners apparently out of town per patient, will change to BID silvadene and dressing changes for now, to have closer eyes on wound, but may want to touch base with Dr. Lajoyce Corners this week to check if he would like to adjust anything.  7. Fluids/Electrolytes/Nutrition:  Strict I/O with 1200 cc FR. Monitor intake. Continue supplements/vitamins.  8. Kleb/Proteus/Enterococcus wound infection: Cefazolin for 2 weeks with EOT 11/29/22 per Dr. Thedore Mins 9.  T2DM: Hgb A1c-7.9 08/27/22 -Monitor BS ac/hs and use SSI for elevated BS. Titrate basal insulin as indicated.  -6/28 she is on Semglee 40 units in the morning and 20 units at bedtime.  Increase bedtime Semglee to 22 units -6/29-30/24 CBGs downtrending, monitor for now with recent changes.  CBG (last 3)  Recent Labs    11/22/22 1636 11/22/22 2152 11/23/22 0613  GLUCAP 183* 224* 102*    10. CAD/biventricular CHF: On GDMT limited due to hypotension.   --Continue Lipitor 80mg  QD   11. ESRD: HD TTS at the end of the day to help with tolerance of therapy.  Thrombosed AVF not salvageable.                --  has been on regular diet-->? Low salt restrictions.   -6/28 nephrology following appreciate assistance, next HD tomorrow 12. Depression with anxiety d/o, panic attacks: Now on Venlafaxine XR 75mg  QD with Lorazepam at night.--lorazepam not ordered, will reorder 0.5mg  QHS PRN, changed to scheduled starting 11/23/22 as mentioned above.   13. PAF: On Eliquis and  amiodarone 200mg  QD for rate control.   -6/28-30 heart rate controlled     11/23/2022    5:26 AM 11/22/2022    8:21 PM 11/22/2022    2:12 PM  Vitals with BMI  Systolic 117 119 161  Diastolic 55 53 54  Pulse 61 64 66    14. Anemia of chronic disease:  -11/22/22 Hgb stable at 8.2 today, monitor labs 15.   H/o GIB due to stercoral ulcer: Avoid constipation. Continue Protonix 40mg  BID, miralax QD , Senokot 2 tabs BID, and PRNs.   -11/23/22 last BM yesterday, monitor 16. Morbid Obesity. Dietary education  -Body mass index is 35.29 kg/m. 17.  Hypotension  -Taking midodrine 10mg  prior to dialysis  -6/29-30/24 BPs stable, monitor Vitals:   11/22/22 1000 11/22/22 1030 11/22/22 1100 11/22/22 1130  BP: (!) 116/48 136/76 (!) 107/34 (!) 112/36   11/22/22 1200 11/22/22 1230 11/22/22 1300 11/22/22 1324  BP: (!) 113/29 (!) 108/28 (!) 109/31 (!) 103/28   11/22/22 1335 11/22/22 1412 11/22/22 2021 11/23/22 0526  BP: 113/85 (!) 119/54 (!) 119/53 (!) 117/55    18. Hypothyroidism: continue synthroid qAM 19. Oxygen dependence: pt states she uses PRN O2 at home, mostly related to anxiety; ordered O2 2L via [place] to maintain SpO2 >90%. Pt with clear lung sounds, doubt need for emergent work up today. Monitor    LOS: 3 days A FACE TO FACE EVALUATION WAS PERFORMED  [address] 11/23/2022, 11:05 AM

## 2022-11-23 NOTE — Progress Notes (Signed)
Physical Therapy Session Note  Patient Details  Name: Susan [name] MRN: [id number] Date of Birth: [birth year]  Today's Date: 11/23/2022 PT Individual Time: 1431-1501 PT Individual Time Calculation (min): 30 min   Short Term Goals: Week 1:  PT Short Term Goal 1 (Week 1): Pt will perform rolling in bed to either side with MinA. PT Short Term Goal 2 (Week 1): Pt will perform supine <> sit with overall ModA. PT Short Term Goal 3 (Week 1): Pt will perform sit<>stand with MaxA +2 and tolerate stance for >20sec. PT Short Term Goal 4 (Week 1): Pt will initiate slide board transfer training. PT Short Term Goal 5 (Week 1): Pt will complete FIST for outcome measure.  Skilled Therapeutic Interventions/Progress Updates:  Pt was seen bedside in the pm. Pt transferred supine to edge of bed with mod A and verbal cues with side rail. Pt tolerated edge of bed about 20 minutes with c/s to CGA and verbal cues. Pt transferred edge of bed to supine with +2 assist. Pt moved up in bed with +2 assist. Pt left sitting up in bed with all needs within reach and bed alarm on.   Therapy Documentation Precautions:  Precautions Precautions: Fall Precaution Comments: L lateral lower leg open wound, R rotator cuff tears, Restrictions Weight Bearing Restrictions: Yes LLE Weight Bearing: Weight bearing as tolerated General: PT Amount of Missed Time (min): 15 Minutes PT Missed Treatment Reason: Patient fatigue;Patient unwilling to participate  Pain: No c/o pain.     Therapy/Group: Individual Therapy  Rayford Halsted 11/23/2022, 3:15 PM

## 2022-11-23 NOTE — Progress Notes (Signed)
Physical Therapy Session Note  Patient Details  Name: Susan [name] MRN: [id number] Date of Birth: 30-May-1950  Today's Date: 11/23/2022 PT Individual Time: 1100-1105, 4098-1191 PT Individual Time Calculation (min): 5 min, 48 min  and Today's Date: 11/23/2022 PT Missed Time: 45 Minutes Missed Time Reason: Patient unwilling to participate;Other (Comment) (pt refused, reports she didn't get much sleep and is fatigued from dialysis)  Short Term Goals: Week 1:  PT Short Term Goal 1 (Week 1): Pt will perform rolling in bed to either side with MinA. PT Short Term Goal 2 (Week 1): Pt will perform supine <> sit with overall ModA. PT Short Term Goal 3 (Week 1): Pt will perform sit<>stand with MaxA +2 and tolerate stance for >20sec. PT Short Term Goal 4 (Week 1): Pt will initiate slide board transfer training. PT Short Term Goal 5 (Week 1): Pt will complete FIST for outcome measure.  Skilled Therapeutic Interventions/Progress Updates:      Treatment Session 1  Pt supine in bed upon arrival. Pt reports she did not get much sleep last night and is fatigued from dialysis. Pt refusing to participate in therapy this session. Discussed taking a nap and participating in session this afternoon. Pt reports she will take a nap and perform bed level activity during afternoon session.   Treatment Session 2   Pt supine in bed upon arrival. Pt agreeable to therapy. Pt reports R shoulder pain, not quantified. Therapist provided rest breaks and repositioning as needed. Pt reports nausea, nurse present to administer medication.   Therapist provided education regarding pressure ulcers: what they are, how they are caused, who is at risk, and how to prevent/treat them. Pt verbalized understanding and provided teach back. Emphasized importance of pt changing positions every 2 hours and getting out of bed to reduce risk of pressure ulcers, facilitate blood flow, healing, increase strength, activity tolerance, etc. Pt  verbalized understanding.   Education provided for positioning of R LE in bed to reduce knee/hip flexion contracture   Therapist provided education regarding cellulitis, what it is, how they are caused, who is at risk, how to prevent/treat it. Emphasized importance of blood flow to facilitate healing.   Pt refusing to get out of bed during session but agreeable to bed level exercise. Pt performed the following therex to increase B LE/core strength:   1x10 quad sets bilaterally   1x10 L LE ankle pumps  1x10 SLR bilaterally  1x10 hip abduction bilaterally  1x10 glute sets  1x10 R LE SAQ    Pt performed rolling bilaterally x2 to each side, with min-mod A to L and max A to R, verbal cues provided for use of outer UE and LE for momentum prior to grabbing onto grab bar. rest breaks provided between trials, education provided for diaphragmatic breathing for SOB.  Pt attempted supine<>sit with HOB elevated and max A, however discontinued 2/2 L LE pain/discomfort and pt laying back down.   Discussed tomorrow getting out of bed and going to gym. Pt agreeable.   Pt supine in bed with HOB elevated, L LE heel floating, needs within reach and bed alarm on.   Therapy Documentation Precautions:  Precautions Precautions: Fall Precaution Comments: L lateral lower leg open wound, R rotator cuff tears, Restrictions Weight Bearing Restrictions: Yes LLE Weight Bearing: Weight bearing as tolerated  Therapy/Group: Individual Therapy  [facility] [place], [place], DPT  11/23/2022, 7:53 AM

## 2022-11-23 NOTE — IPOC Note (Signed)
Overall Plan of Care [facility]) Patient Details Name: Susan [name] MRN: [id number] DOB: [birth year]  Admitting Diagnosis: Right below-knee amputee [facility])  Hospital Problems: Principal Problem:   Right below-knee amputee [facility])     Functional Problem List: Nursing Bowel, Pain, Endurance, Medication Management, Safety, Skin Integrity  PT Balance, Edema, Endurance, Motor, Nutrition, Pain, Perception, Safety, Sensory, Skin Integrity  OT Balance, Safety, Sensory, Edema, Skin Integrity, Endurance, Motor, Pain  SLP    TR         Basic ADL's: OT Grooming, Bathing, Dressing, Toileting     Advanced  ADL's: OT Simple Meal Preparation, Laundry, Light Housekeeping     Transfers: PT Bed Mobility, Bed to Chair, Car, Occupational psychologist, Research scientist (life sciences): PT Psychologist, prison and probation services, Ambulation     Additional Impairments: OT Fuctional Use of Upper Extremity  SLP        TR      Anticipated Outcomes Item Anticipated Outcome  Self Feeding indep  Swallowing      Basic self-care  mod I UB, mod A LB  Toileting  mod A   Bathroom Transfers mod A  Bowel/Bladder  manage bowel w mod I  Transfers  MaxA  Locomotion  MaxA for w/c mobility  Communication     Cognition     Pain  < 4 with prns  Safety/Judgment  manage w cues   Therapy Plan: PT Intensity: Minimum of 1-2 x/day ,45 to 90 minutes PT Frequency: 5 out of 7 days PT Duration Estimated Length of Stay: 21 days OT Intensity: Minimum of 1-2 x/day, 45 to 90 minutes OT Frequency: 5 out of 7 days OT Duration/Estimated Length of Stay: 3 weeks     Team Interventions: Nursing Interventions Medication Management, Disease Management/Prevention, Discharge Planning, Pain Management, Bowel Management, Patient/Family Education, Skin Care/Wound Management  PT interventions Ambulation/gait training, Community reintegration, DME/adaptive equipment instruction, Neuromuscular re-education, Psychosocial support, UE/LE Strength  taining/ROM, Wheelchair propulsion/positioning, Warden/ranger, Discharge planning, Functional electrical stimulation, Pain management, Skin care/wound management, Therapeutic Activities, UE/LE Coordination activities, Cognitive remediation/compensation, Disease management/prevention, Functional mobility training, Patient/family education, Splinting/orthotics, Therapeutic Exercise, Visual/perceptual remediation/compensation  OT Interventions Balance/vestibular training, Discharge planning, Functional electrical stimulation, Pain management, Self Care/advanced ADL retraining, Therapeutic Activities, UE/LE Coordination activities, Disease mangement/prevention, Functional mobility training, Cognitive remediation/compensation, Patient/family education, Therapeutic Exercise, Skin care/wound managment, Visual/perceptual remediation/compensation, Neuromuscular re-education, Splinting/orthotics, UE/LE Strength taining/ROM, Psychosocial support, Wheelchair propulsion/positioning  SLP Interventions    TR Interventions    SW/CM Interventions Discharge Planning, Psychosocial Support, Patient/Family Education   Barriers to Discharge MD  Medical stability, Home enviroment access/loayout, Incontinence, Neurogenic bowel and bladder, Wound care, Weight, Weight bearing restrictions, and and old BKA and LLE wound  Nursing Home environment access/layout, Lack of/limited family support, Hemodialysis, Wound Care 1 level ramped entry, sleeps in reclinerm non ambulatory for 9 mths, w/c transport to HD, hoyer lift transfers w spouse  PT Inaccessible home environment, Decreased caregiver support, Home environment access/layout, IV antibiotics, Incontinence, Wound Care, Lack of/limited family support, Insurance for SNF coverage, Weight, Hemodialysis, Medication compliance, Nutrition means    OT Home environment access/layout, Incontinence, Weight, Wound Care wound, body habitus, bowel and bladder incontinence, pain   SLP      SW Insurance for SNF coverage, Decreased caregiver support     Team Discharge Planning: Destination: PT-Home ,OT- Home , SLP-  Projected Follow-up: PT-Home health PT, 24 hour supervision/assistance, Outpatient PT, OT-  Home health OT, SLP-  Projected Equipment Needs: PT-To be determined, OT- 3 in  1 bedside comode, Sliding board, SLP-  Equipment Details: PT- , OT-DABSC, transfer board (may need) Patient/family involved in discharge planning: PT- Patient,  OT-Patient, SLP-   MD ELOS: 3 weeks Medical Rehab Prognosis:  Fair Assessment: The patient has been admitted for CIR therapies with the diagnosis of old BKA and LLE wounds- debility. The team will be addressing functional mobility, strength, stamina, balance, safety, adaptive techniques and equipment, self-care, bowel and bladder mgt, patient and caregiver education, prosthetic training. Goals have been set at mod-max A. Anticipated discharge destination is home with family.        See Team Conference Notes for weekly updates to the plan of care

## 2022-11-23 NOTE — Progress Notes (Signed)
[facility] Progress Note   Subjective: Seen lying in bed, eyes closed RR unlabored. HD 11/22/2022 Net UF 2.5 liters   Objective Vitals:   11/22/22 1335 11/22/22 1412 11/22/22 2021 11/23/22 0526  BP: 113/85 (!) 119/54 (!) 119/53 (!) 117/55  Pulse: 62 66 64 61  Resp: 15 15 18 18   Temp: 97.8 F (36.6 C) 98 F (36.7 C) 97.7 F (36.5 C) 98 F (36.7 C)  TempSrc:    Oral  SpO2: 95% 100% 100% 100%  Weight: 96.2 kg     Height:       Physical Exam General: Elderly female in NAD Heart: S1,S2 RRR No M/R Lungs: CTAB Abdomen: obese, NABS Extremities: LLE with dressing in place. Edema in hips. R BKA.  Dialysis Access: [facility] drsg intact    Additional Objective Labs: Basic Metabolic Panel: Recent Labs  Lab 11/18/22 0151 11/19/22 0205 11/20/22 0116 11/22/22 0920  NA 134* 131* 132* 133*  K 4.2 3.9 4.2 4.1  CL 97* 95* 95* 96*  CO2 23 25 26 27   GLUCOSE 85 207* 330* 273*  BUN 70* 42* 35* 49*  CREATININE 5.43* 3.97* 3.63* 4.34*  CALCIUM 8.7* 8.4* 8.9 9.2  PHOS 6.5* 4.6  --  4.3   Liver Function Tests: Recent Labs  Lab 11/18/22 0151 11/19/22 0205 11/22/22 0920  ALBUMIN 2.6* 2.5* 2.6*   No results for input(s): "LIPASE", "AMYLASE" in the last 168 hours. CBC: Recent Labs  Lab 11/17/22 0739 11/18/22 0151 11/19/22 0205 11/20/22 0116 11/22/22 0920  WBC 5.5 5.7 4.8 5.9 6.7  NEUTROABS 3.7  --   --   --   --   HGB 9.1* 8.7* 8.4* 8.5* 8.2*  HCT 30.3* 29.2* 28.0* 28.2* 27.8*  MCV 81.5 79.3* 80.7 81.5 81.8  PLT 171 167 174 187 177   Blood Culture    Component Value Date/Time   SDES TISSUE 11/14/2022 0919   SPECREQUEST NONE 11/14/2022 0919   CULT  11/14/2022 0919    FEW PROTEUS MIRABILIS RARE KLEBSIELLA PNEUMONIAE NO ANAEROBES ISOLATED FEW ENTEROCOCCUS FAECALIS    REPTSTATUS 11/19/2022 FINAL 11/14/2022 0919    Cardiac Enzymes: No results for input(s): "CKTOTAL", "CKMB", "CKMBINDEX", "TROPONINI" in the last 168 hours. CBG: Recent Labs  Lab  11/22/22 0607 11/22/22 1407 11/22/22 1636 11/22/22 2152 11/23/22 0613  GLUCAP 222* 131* 183* 224* 102*   Iron Studies: No results for input(s): "IRON", "TIBC", "TRANSFERRIN", "FERRITIN" in the last 72 hours. @lablastinr3 @ Studies/Results: No results found. Medications:   ceFAZolin (ANCEF) IV 2 g (11/22/22 1802)    (feeding supplement) PROSource Plus  30 mL Oral BID BM   amiodarone  200 mg Oral Daily   apixaban  5 mg Oral BID   ascorbic acid  500 mg Oral Daily   atorvastatin  80 mg Oral Daily   Chlorhexidine Gluconate Cloth  6 each Topical BID   feeding supplement (NEPRO CARB STEADY)  237 mL Oral BID BM   folic acid  1 mg Oral Daily   insulin aspart  0-6 Units Subcutaneous TID WC   insulin glargine-yfgn  22 Units Subcutaneous QHS   insulin glargine-yfgn  40 Units Subcutaneous Daily   levothyroxine  75 mcg Oral QAC breakfast   LORazepam  0.5 mg Oral QHS   midodrine  10 mg Oral Q T,Th,Sa-HD   multivitamin  1 tablet Oral QHS   pantoprazole  40 mg Oral BID AC   polyethylene glycol  17 g Oral Daily   ramelteon  8 mg  Oral QHS   senna-docusate  2 tablet Oral BID   sevelamer carbonate  1,600 mg Oral TID WC   silver sulfADIAZINE   Topical BID   venlafaxine XR  75 mg Oral Q breakfast   zinc sulfate  220 mg Oral Daily     ialysis Orders: [facility] TTS 4:00 400/800 EDW 91.5kg 2K/2Ca TDC  -Heparin 4000 units IV TIW -Mircera 75 mcg IV q 2 weeks  - last 6/8 -Calcitriol 0.5 three times per week     Assessment/Plan: Infected left lower extremity wound - maggot infestation on admission. Blood cultures neg. Ortho following - s/p OR debridement/wound vac on 6/21. Wound cultures+ proteus/klebsiella --Continue cefazolin 2g qHD until 11/28/22 ESRD -  HD TTS. Next HD 11/25/2022 Thrombosed AVF - not salvageable. For access surgery next month  Hypotension/volume  - BP soft during admission. On midodrine pre HD. Albumin prn. Does have some volume on exam. UF as able.  Anemia  - Hgb 8.5. Received  Aranesp 150 on 6/23.  Metabolic bone disease -  Ca/Phos acceptable. Continue sevelamer binders with diet.  Nutrition - Renal diet with fluid restriction. + Prot supp.  AFib - on amiodarone/Eliquis  T2DM - historically poorly controlled. Insulin per primary AMS/Twitching - felt secondary to Lyrica toxicity in ESRD pt. Discontinued - continue to hold. Dispo - For CIR admit   [employer]. Susan Keiffer NP-C 11/23/2022, 11:13 AM  [employer] [phone]

## 2022-11-24 DIAGNOSIS — Z89511 Acquired absence of right leg below knee: Secondary | ICD-10-CM | POA: Diagnosis not present

## 2022-11-24 DIAGNOSIS — I480 Paroxysmal atrial fibrillation: Secondary | ICD-10-CM | POA: Diagnosis not present

## 2022-11-24 DIAGNOSIS — N186 End stage renal disease: Secondary | ICD-10-CM | POA: Diagnosis not present

## 2022-11-24 DIAGNOSIS — E1165 Type 2 diabetes mellitus with hyperglycemia: Secondary | ICD-10-CM | POA: Diagnosis not present

## 2022-11-24 DIAGNOSIS — K5900 Constipation, unspecified: Secondary | ICD-10-CM

## 2022-11-24 LAB — CBC
HCT: 29.6 % — ABNORMAL LOW (ref 36.0–46.0)
Hemoglobin: 8.7 g/dL — ABNORMAL LOW (ref 12.0–15.0)
MCH: 23.5 pg — ABNORMAL LOW (ref 26.0–34.0)
MCHC: 29.4 g/dL — ABNORMAL LOW (ref 30.0–36.0)
MCV: 80 fL (ref 80.0–100.0)
Platelets: 208 10*3/uL (ref 150–400)
RBC: 3.7 MIL/uL — ABNORMAL LOW (ref 3.87–5.11)
RDW: 17.7 % — ABNORMAL HIGH (ref 11.5–15.5)
WBC: 5.6 10*3/uL (ref 4.0–10.5)
nRBC: 0 % (ref 0.0–0.2)

## 2022-11-24 LAB — RENAL FUNCTION PANEL
Albumin: 2.7 g/dL — ABNORMAL LOW (ref 3.5–5.0)
Anion gap: 12 (ref 5–15)
BUN: 68 mg/dL — ABNORMAL HIGH (ref 8–23)
CO2: 26 mmol/L (ref 22–32)
Calcium: 9 mg/dL (ref 8.9–10.3)
Chloride: 96 mmol/L — ABNORMAL LOW (ref 98–111)
Creatinine, Ser: 5.12 mg/dL — ABNORMAL HIGH (ref 0.44–1.00)
GFR, Estimated: 8 mL/min — ABNORMAL LOW (ref >60)
Glucose, Bld: 229 mg/dL — ABNORMAL HIGH (ref 70–99)
Phosphorus: 5 mg/dL — ABNORMAL HIGH (ref 2.5–4.6)
Potassium: 3.9 mmol/L (ref 3.5–5.1)
Sodium: 134 mmol/L — ABNORMAL LOW (ref 135–145)

## 2022-11-24 LAB — GLUCOSE, CAPILLARY
Glucose-Capillary: 106 mg/dL — ABNORMAL HIGH (ref 70–99)
Glucose-Capillary: 142 mg/dL — ABNORMAL HIGH (ref 70–99)

## 2022-11-24 MED ORDER — DARBEPOETIN ALFA 150 MCG/0.3ML IJ SOSY
150.0000 ug | PREFILLED_SYRINGE | INTRAMUSCULAR | Status: DC
  Filled 2022-11-24: qty 0.3

## 2022-11-24 MED ORDER — HEPARIN SODIUM (PORCINE) 1000 UNIT/ML DIALYSIS
2000.0000 [IU] | Freq: Once | INTRAMUSCULAR | Status: AC
  Administered 2022-11-24: 2000 [IU] via INTRAVENOUS_CENTRAL

## 2022-11-24 MED ORDER — HEPARIN SODIUM (PORCINE) 1000 UNIT/ML IJ SOLN
INTRAMUSCULAR | Status: AC
  Filled 2022-11-24: qty 2

## 2022-11-24 MED ORDER — HEPARIN SODIUM (PORCINE) 1000 UNIT/ML IJ SOLN
INTRAMUSCULAR | Status: AC
  Filled 2022-11-24: qty 4

## 2022-11-24 MED ORDER — POLYETHYLENE GLYCOL 3350 17 G PO PACK
17.0000 g | PACK | Freq: Two times a day (BID) | ORAL | Status: DC
  Administered 2022-11-25 – 2022-11-27 (×4): 17 g via ORAL
  Filled 2022-11-24 (×7): qty 1

## 2022-11-24 NOTE — Progress Notes (Signed)
Physical Therapy Session Note  Patient Details  Name: Susan [name] MRN: [id number] Date of Birth: March 14, 1951  Today's Date: 11/24/2022 PT Individual Time: 0800-0912, [id number] PT Individual Time Calculation (min): 72 min, 56 min   Short Term Goals: Week 1:  PT Short Term Goal 1 (Week 1): Pt will perform rolling in bed to either side with MinA. PT Short Term Goal 2 (Week 1): Pt will perform supine <> sit with overall ModA. PT Short Term Goal 3 (Week 1): Pt will perform sit<>stand with MaxA +2 and tolerate stance for >20sec. PT Short Term Goal 4 (Week 1): Pt will initiate slide board transfer training. PT Short Term Goal 5 (Week 1): Pt will complete FIST for outcome measure.  Skilled Therapeutic Interventions/Progress Updates:      Treatment Session 1  Pt supine in bed upon arrival. Pt agreeable to therapy. Pt denies any pain. Pt reports urinary incontinence. Pt on 2L of O2 with O2 saturation 97 and above throughout session.   Pt performed rolling bilaterally with use of handrails and verbal cues provided for technique, with min A to L and mod A to R, while therapist changed diaper and performed pericare.   Pt performed supine to sit with HOB elevated and mod A, verbal cues provided for technique, verbal and tactile cues provided for reciprocal scoot.Pt attempted to donn pants while sitting EOB with max A with  lateral trunk lean technique but unable to get enough clearance. Pt attempted to donn pants with sit<>stand with steady and +2 max A but unable to tolerate 2/2 pain, and unable to get enough clearance 2/2 weakness. Pt performed sit<>supine with supervision, pt performed bridging technique with minimal clearance and rolling R and L with min-mod A with therapist donning pants with max A.  Pt initiated slide board transfer training bed to [facility] to R with +2 max A, verbal cues provided for technique. Pt transported dependent in WC to main gym. Pt performed 2x6 wheelchair pushups with verbal  cues provided for technique. Pt performed reached forward to grab swiss ball out of techs  hands and return it to techs hands with CGA-min A, to facilitate forward trunk lean for transfers, discontinued as pt reports R UE pain. Modified activity to pt performing forward trunk lean with verbal cues for nose to swiss ball positioned in front of patient.   Pt performed the following therex for B LE strength, verbal cues provided to reduce compensation and performance within pt available range.    1x10 SLR bilaterally  1x10 LAQ bilaterally  1x10 L LE ankle pumps  1x10 quad sets bilaterally  1x10 glute sets  Pt agreeable to stay in WC. Pt seated in [facility] with all needs within reach and seatbelt alarm on.   Treatment Session 2   Pt seated in WC upon arrival. Pt agreeable to therapy. Pt reports 8/10 L LE pain, pt reports she does not want any medication. Therapist provided seated rest breaks and repositioning as needed for pain.   Pt transported dependent in [facility] for time and energy conservation. Pt attempted sit<>stand x5 in // bars with +2 A for safety with therapist stabilizing L LE,  verbal cues provided for technique, with emphasis on leaning towards L LE versus R for stability. Pt able to clear buttocks, but unable to reach full standing with each trial.   Pt performed slide board transfer WC to mat table to R with +2 A, pt demos recall of steps from morning session but requires verbal  and tactile cues for implementation of forward trunk lean, head hip ratio, and buttock clearance. Pt demos poor buttock clearance. Therapist switched out wheelchair cushion as current cushion impeding placement of board under L LE. While sitting on EOM pt placed B UE support on blue bench in front of pt to facilitate core activiation, forward trunk lean, L LE and B UE activation, and buttock clearance, pt performed x5 with min A with therapist for returning to upright posture and to prevent anterior slippage on mat, verbal  cues provided for technique, tactile cues provided for appoximation to L LE. Pt demos mild buttock clearance.   Pt performed slide board transfer mat table to WC to L with +2 max A mat table to WC and WC to bed. Pt performed sit <>supine with supervision. Pt supine in bed with HOB elevated with L LE heel floating, needs within reach and bed alarm on.       Therapy Documentation Precautions:  Precautions Precautions: Fall Precaution Comments: L lateral lower leg open wound, R rotator cuff tears, Restrictions Weight Bearing Restrictions: Yes LLE Weight Bearing: Weight bearing as tolerated  Therapy/Group: Individual Therapy  [facility] [place], [place], DPT  11/24/2022, 7:23 AM

## 2022-11-24 NOTE — Progress Notes (Signed)
   11/24/22 1000  Spiritual Encounters  Type of Visit Initial  Care provided to: Patient  Referral source Nurse (RN/NT/LPN)  Reason for visit Routine spiritual support  OnCall Visit No  Spiritual Framework  Presenting Themes Meaning/purpose/sources of inspiration  Interventions  Spiritual Care Interventions Made Compassionate presence;Reflective listening  Intervention Outcomes  Outcomes Reduced isolation   Ch responded to request for emotional and spiritual support. There was no family at bedside. Pt said there is nothing she can do about her situation. Ch listened asked guided question that brought forth feelings. No follow-up needed at this time.

## 2022-11-24 NOTE — Progress Notes (Signed)
PROGRESS NOTE   Subjective/Complaints: Reports she continues to have pain but overall tolerable, she declines any medications.  Reports she feels mildly constipated. LBM yesterday small.   She reported orthopnea to nephrology- HD moved to today.   ROS: Denies fever or chills, chest pain,  abd pain, nausea, vomiting, diarrhea,  new changes in motor or sensory function +SOB, + constipation  Objective:   No results found. Recent Labs    11/22/22 0920  WBC 6.7  HGB 8.2*  HCT 27.8*  PLT 177    Recent Labs    11/22/22 0920  NA 133*  K 4.1  CL 96*  CO2 27  GLUCOSE 273*  BUN 49*  CREATININE 4.34*  CALCIUM 9.2     Intake/Output Summary (Last 24 hours) at 11/24/2022 0845 Last data filed at 11/24/2022 1610 Gross per 24 hour  Intake 318 ml  Output --  Net 318 ml      Pressure Injury 05/23/22 Buttocks Right Deep Tissue Pressure Injury - Purple or maroon localized area of discolored intact skin or blood-filled blister due to damage of underlying soft tissue from pressure and/or shear. (Active)  05/23/22 1715  Location: Buttocks  Location Orientation: Right  Staging: Deep Tissue Pressure Injury - Purple or maroon localized area of discolored intact skin or blood-filled blister due to damage of underlying soft tissue from pressure and/or shear.  Wound Description (Comments):   Present on Admission: Yes     Pressure Injury 08/27/22 Coccyx Mid Stage 2 -  Partial thickness loss of dermis presenting as a shallow open injury with a red, pink wound bed without slough. 53mmx5mmx0 red center unattached edges (Active)  08/27/22 1000  Location: Coccyx  Location Orientation: Mid  Staging: Stage 2 -  Partial thickness loss of dermis presenting as a shallow open injury with a red, pink wound bed without slough.  Wound Description (Comments): 52mmx5mmx0 red center unattached edges  Present on Admission:     Physical Exam: Vital  Signs Blood pressure (!) 97/50, pulse 63, temperature 97.9 F (36.6 C), resp. rate 18, height 5\' 5"  (1.651 m), weight 97.9 kg, SpO2 100 %.  Physical Exam Constitutional: No apparent distress.  Sitting in chair HENT: [place], AT, MMM Eyes: PERRLA. EOMI. Visual fields grossly intact.  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  On 2L via [place], no increased WOB Abdomen: Soft, nontender, nondistended, +bowel sounds Skin:  + Right BKA dry dressing in place + Left calf wound wrapped in Ace bandage-clean and dry  Neurologic exam: Alert and awake, decreased sensation below distal thighs, cranial nerves II through XII grossly intact Right IJ TDC in place MSK: No abnormal tone noted      + Active range of motion in right shoulder limited in < 90 degrees flexion, abduction due to prior unrepaired shoulder dislocation      Strength:                RUE: 4/5 elbow flexion, extension, wrist, finger flexion, finger abduction                LUE: 4 out of 5 throughout  LLE: 1/5 HF, 4/5 KE, 3/5 DF, 3/5 EHL, 4/5 PF                 RLE:  4/5 HF, 4/5 KE    Assessment/Plan: 1. Functional deficits which require 3+ hours per day of interdisciplinary therapy in a comprehensive inpatient rehab setting. Physiatrist is providing close team supervision and 24 hour management of active medical problems listed below. Physiatrist and rehab team continue to assess barriers to discharge/monitor patient progress toward functional and medical goals  Care Tool:  Bathing    Body parts bathed by patient: Right arm, Left arm, Chest, Face   Body parts bathed by helper: Buttocks, Front perineal area, Abdomen, Right upper leg, Left upper leg Body parts n/a: Right lower leg, Left lower leg   Bathing assist Assist Level: Dependent - Patient 0%     Upper Body Dressing/Undressing Upper body dressing   What is the patient wearing?: Hospital gown only    Upper body  assist Assist Level: Maximal Assistance - Patient 25 - 49%    Lower Body Dressing/Undressing Lower body dressing      What is the patient wearing?: Incontinence brief, Hospital gown only, Ace wrap/stump shrinker     Lower body assist Assist for lower body dressing: Dependent - Patient 0%     Toileting Toileting    Toileting assist Assist for toileting: Dependent - Patient 0%     Transfers Chair/bed transfer  Transfers assist     Chair/bed transfer assist level: Dependent - mechanical lift     Locomotion Ambulation   Ambulation assist   Ambulation activity did not occur: Safety/medical concerns          Walk 10 feet activity   Assist  Walk 10 feet activity did not occur: Safety/medical concerns        Walk 50 feet activity   Assist Walk 50 feet with 2 turns activity did not occur: Safety/medical concerns         Walk 150 feet activity   Assist Walk 150 feet activity did not occur: Safety/medical concerns         Walk 10 feet on uneven surface  activity   Assist Walk 10 feet on uneven surfaces activity did not occur: Safety/medical concerns         Wheelchair     Assist Is the patient using a wheelchair?: Yes Type of Wheelchair: Manual    Wheelchair assist level: Dependent - Patient 0% Max wheelchair distance: 10 ft    Wheelchair 50 feet with 2 turns activity    Assist        Assist Level: Dependent - Patient 0%   Wheelchair 150 feet activity     Assist      Assist Level: Dependent - Patient 0%   Blood pressure (!) 97/50, pulse 63, temperature 97.9 F (36.6 C), resp. rate 18, height 5\' 5"  (1.651 m), weight 97.9 kg, SpO2 100 %.  Medical Problem List and Plan: 1. Functional deficits secondary to right BKA revision 1/24 and left leg wound             -patient may shower with wound covered             -ELOS/Goals: 10-14 days, min to mod assist PT/OT -Was preparing for initial prosthetic fitting with  [employer] prior to admission, they are aware she is coming to rehab, would coordinate trial of prosthetic device while at inpatient rehab   -Continue CIR 2.  Antithrombotics: -DVT/anticoagulation:  Pharmaceutical: Eliquis 5mg  BID             -antiplatelet therapy: N/A 3. Pain Management:  Tylenol, Robaxin, and Oxycodone prn.  -Patient had altered mental status and twitching suspected due to Lyrica toxicity prior to admission to CIR.  Lyrica was discontinued. 4. Mood/Behavior/Sleep: LCSW to follow for evaluation and support.  --chronic insomnia--has Ambien prn w/ Ramelteron 8mg  QHS, and Lorazepam. -11/22/22 ambien not on orders; Lorazepam also not on orders, but will reorder 0.5mg  QHS PRN, also melatonin 5mg  QHS PRN, will hold off on ambien for now.  -11/23/22 poor sleep again, will schedule lorazepam for at bedtime, leave melatonin PRN to try, cont holding off on ambien for now but may want to consider restarting if sleep continues to be an issue.              -antipsychotic agents: N/A 5. Neuropsych/cognition: This patient is capable of making decisions on her own behalf. -11/23/22 pt requesting spiritual care/chaplain, ordered for this; also may benefit from neuropsych consult this week-- defer to weekday team -7/1 Seen by chaplain today 6. Skin/Wound Care: Monitor wound for healing.  --Wound VAC d/c due to concerns of wound getting macerated per Dr. Lajoyce Corners.  --Dry dressing changes and to add silvadene to wound bed if wound starts getting dry.  -6/28 VAC was removed last night Dr. Lajoyce Corners advises to "Apply small amount of silvadine pulse gauze and ace wrap. Change daily " -11/22/22 yellowish slough/drainage from wound bed, contacted Dr. Lajoyce Corners regarding this, will see if he wants BID dressing changes-- still pending response. Will f/up tomorrow. For now, doesn't look like worsening infection, can monitor closely. Will undress wound and re-photograph tomorrow -11/23/22 wound looks about the same or maybe a  little better today, Dr. Lajoyce Corners apparently out of town per patient, will change to BID silvadene and dressing changes for now, to have closer eyes on wound, but may want to touch base with Dr. Lajoyce Corners this week to check if he would like to adjust anything.  7. Fluids/Electrolytes/Nutrition:  Strict I/O with 1200 cc FR. Monitor intake. Continue supplements/vitamins.  8. Kleb/Proteus/Enterococcus wound infection: Cefazolin for 2 weeks with EOT 11/29/22 per Dr. Thedore Mins 9.  T2DM: Hgb A1c-7.9 08/27/22 -Monitor BS ac/hs and use SSI for elevated BS. Titrate basal insulin as indicated.  -6/28 she is on Semglee 40 units in the morning and 20 units at bedtime.  Increase bedtime Semglee to 22 units -7/1 fair CBG control, continue current regimen an dmonitor CBG (last 3)  Recent Labs    11/23/22 1648 11/23/22 2040 11/24/22 0608  GLUCAP 197* 225* 106*     10. CAD/biventricular CHF: On GDMT limited due to hypotension.   --Continue Lipitor 80mg  QD   11. ESRD: HD TTS at the end of the day to help with tolerance of therapy.  Thrombosed AVF not salvageable.                --has been on regular diet-->? Low salt restrictions.   -6/28 nephrology following appreciate assistance, next HD tomorrow  -7/1 HD plans HD today due to orthopnea + SOB 12. Depression with anxiety d/o, panic attacks: Now on Venlafaxine XR 75mg  QD with Lorazepam at night.--lorazepam not ordered, will reorder 0.5mg  QHS PRN, changed to scheduled starting 11/23/22 as mentioned above.   13. PAF: On Eliquis and amiodarone 200mg  QD for rate control.   -7/1 HR stable in 60s, continue current     11/24/2022    5:11 AM 11/23/2022  7:35 PM 11/23/2022    2:24 PM  Vitals with BMI  Weight 215 lbs 13 oz    BMI 35.92    Systolic 97 124 114  Diastolic 50 50 37  Pulse 63 62 65    14. Anemia of chronic disease:  -11/22/22 Hgb stable at 8.2 today, monitor labs 15.   H/o GIB due to stercoral ulcer: Avoid constipation. Continue Protonix 40mg  BID, miralax QD  , Senokot 2 tabs BID, and PRNs.   -11/23/22 last BM yesterday, monitor  -7/1 Small BM yesterday, increase miralax to BID for constipation  16. Morbid Obesity. Dietary education  -Body mass index is 35.92 kg/m. 17.  Hypotension  -Taking midodrine 10mg  prior to dialysis  -6/29-30/24 BPs stable, monitor Vitals:   11/22/22 1130 11/22/22 1200 11/22/22 1230 11/22/22 1300  BP: (!) 112/36 (!) 113/29 (!) 108/28 (!) 109/31   11/22/22 1324 11/22/22 1335 11/22/22 1412 11/22/22 2021  BP: (!) 103/28 113/85 (!) 119/54 (!) 119/53   11/23/22 0526 11/23/22 1424 11/23/22 1935 11/24/22 0511  BP: (!) 117/55 (!) 114/37 (!) 124/50 (!) 97/50    18. Hypothyroidism: continue synthroid qAM 19. Oxygen dependence: pt states she uses PRN O2 at home, mostly related to anxiety; ordered O2 2L via [place] to maintain SpO2 >90%. Pt with clear lung sounds, doubt need for emergent work up today. Monitor    LOS: 4 days A FACE TO FACE EVALUATION WAS PERFORMED  Fanny Dance 11/24/2022, 8:45 AM

## 2022-11-24 NOTE — Plan of Care (Signed)
  Problem: RH Balance Goal: LTG Patient will maintain dynamic sitting balance (PT) Description: LTG:  Patient will maintain dynamic sitting balance with assistance during mobility activities (PT) Flowsheets (Taken 11/21/2022 1738) LTG: Pt will maintain dynamic sitting balance during mobility activities with:: Independent Goal: LTG Patient will maintain dynamic standing balance (PT) Description: LTG:  Patient will maintain dynamic standing balance with assistance during mobility activities (PT) Flowsheets (Taken 11/21/2022 1738) LTG: Pt will maintain dynamic standing balance during mobility activities with:: Maximal Assistance - Patient 25 - 49%   Problem: Sit to Stand Goal: LTG:  Patient will perform sit to stand with assistance level (PT) Description: LTG:  Patient will perform sit to stand with assistance level (PT) Flowsheets (Taken 11/21/2022 1738) LTG: PT will perform sit to stand in preparation for functional mobility with assistance level: Maximal Assistance - Patient 25 - 49%   Problem: RH Bed Mobility Goal: LTG Patient will perform bed mobility with assist (PT) Description: LTG: Patient will perform bed mobility with assistance, with/without cues (PT). Flowsheets (Taken 11/21/2022 1738) LTG: Pt will perform bed mobility with assistance level of: Supervision/Verbal cueing   Problem: RH Bed to Chair Transfers Goal: LTG Patient will perform bed/chair transfers w/assist (PT) Description: LTG: Patient will perform bed to chair transfers with assistance (PT). Flowsheets (Taken 11/21/2022 1738) LTG: Pt will perform Bed to Chair Transfers with assistance level: Moderate Assistance - Patient 50 - 74%   Problem: RH Car Transfers Goal: LTG Patient will perform car transfers with assist (PT) Description: LTG: Patient will perform car transfers with assistance (PT). Flowsheets (Taken 11/21/2022 1738) LTG: Pt will perform car transfers with assist:: Moderate Assistance - Patient 50 - 74%    Problem: RH Furniture Transfers Goal: LTG Patient will perform furniture transfers w/assist (OT/PT) Description: LTG: Patient will perform furniture transfers  with assistance (OT/PT). Flowsheets (Taken 11/21/2022 1738) LTG: Pt will perform furniture transfers with assist:: Moderate Assistance - Patient 50 - 74%   Problem: RH Wheelchair Mobility Goal: LTG Patient will propel w/c in controlled environment (PT) Description: LTG: Patient will propel wheelchair in controlled environment, # of feet with assist (PT) Flowsheets (Taken 11/21/2022 1738) LTG: Pt will propel w/c in controlled environ  assist needed:: Moderate Assistance - Patient 50 - 74% LTG: Propel w/c distance in controlled environment: 60 ft Goal: LTG Patient will propel w/c in home environment (PT) Description: LTG: Patient will propel wheelchair in home environment, # of feet with assistance (PT). Flowsheets (Taken 11/21/2022 1738) LTG: Pt will propel w/c in home environ  assist needed:: Moderate Assistance - Patient 50 - 74% Distance: wheelchair distance in controlled environment: 60 LTG: Propel w/c distance in home environment: up to 40 ft

## 2022-11-24 NOTE — Progress Notes (Signed)
Occupational Therapy Session Note  Patient Details  Name: Susan [name] MRN: [id number] Date of Birth: 09-24-1950  Today's Date: 11/24/2022 OT Individual Time: 4098-1191 OT Individual Time Calculation (min): 29 min    Short Term Goals: Week 1:  OT Short Term Goal 1 (Week 1): Pt will complete UB seflcare EOB unsupported with CGA OT Short Term Goal 2 (Week 1): Pt will access DABSC via TB with 2 helpers OT Short Term Goal 3 (Week 1): Pt will trial sit to stand in STEDY frame with 2 helper assist  Skilled Therapeutic Interventions/Progress Updates:  Pt received sitting in [facility] for skilled OT session with focus on general conditioning. Pt agreeable to interventions, demonstrating overall pleasant mood. Pt reported 8/10 pain in LLE. OT offering intermediate rest breaks and positioning suggestions throughout session to address pain/fatigue and maximize participation/safety in session.   Pt dependent for WC transport from room <> main therapy gym for time management. In main therapy gym, pt participates in series of LLE strengthening exercises, detailed below: -Seated toe taps -Leg raises w/ 1.5# ankle weight -Hip adduction holds  Pt performs 3x10 reps (10 sec holds) of each exercise with verbal cuing for correct form.   Pt received and maintained on 2L of [place] throughout session.   Pt remained sitting in [facility] with all immediate needs met at end of session. Pt continues to be appropriate for skilled OT intervention to promote further functional independence.   Therapy Documentation Precautions:  Precautions Precautions: Fall Precaution Comments: L lateral lower leg open wound, R rotator cuff tears, Restrictions Weight Bearing Restrictions: Yes LLE Weight Bearing: Weight bearing as tolerated   Therapy/Group: Individual Therapy  Lou Cal, OTR/L, MSOT  11/24/2022, 6:02 AM

## 2022-11-24 NOTE — Progress Notes (Addendum)
[facility] Progress Note   Subjective:  Seen in room - working with PT. Reports breathing was rough last night, + orthopnea and needing O2 today. Will move her next HD to today for symptoms.  Objective Vitals:   11/23/22 0526 11/23/22 1424 11/23/22 1935 11/24/22 0511  BP: (!) 117/55 (!) 114/37 (!) 124/50 (!) 97/50  Pulse: 61 65 62 63  Resp: 18 15 18 18   Temp: 98 F (36.7 C) 97.7 F (36.5 C) 98 F (36.7 C) 97.9 F (36.6 C)  TempSrc: Oral Oral    SpO2: 100% 100% 100% 100%  Weight:    97.9 kg  Height:       Physical Exam General: Chronically ill appearing woman, NAD. Nasal O2 in place Heart: RRR; no murmur Lungs: CTAB; no rales Abdomen: soft Extremities: R BKA, LLE bandaged Dialysis Access: [facility]  Additional Objective Labs: Basic Metabolic Panel: Recent Labs  Lab 11/18/22 0151 11/19/22 0205 11/20/22 0116 11/22/22 0920  NA 134* 131* 132* 133*  K 4.2 3.9 4.2 4.1  CL 97* 95* 95* 96*  CO2 23 25 26 27   GLUCOSE 85 207* 330* 273*  BUN 70* 42* 35* 49*  CREATININE 5.43* 3.97* 3.63* 4.34*  CALCIUM 8.7* 8.4* 8.9 9.2  PHOS 6.5* 4.6  --  4.3   Liver Function Tests: Recent Labs  Lab 11/18/22 0151 11/19/22 0205 11/22/22 0920  ALBUMIN 2.6* 2.5* 2.6*   CBC: Recent Labs  Lab 11/18/22 0151 11/19/22 0205 11/20/22 0116 11/22/22 0920  WBC 5.7 4.8 5.9 6.7  HGB 8.7* 8.4* 8.5* 8.2*  HCT 29.2* 28.0* 28.2* 27.8*  MCV 79.3* 80.7 81.5 81.8  PLT 167 174 187 177   Blood Culture    Component Value Date/Time   SDES TISSUE 11/14/2022 0919   SPECREQUEST NONE 11/14/2022 0919   CULT  11/14/2022 0919    FEW PROTEUS MIRABILIS RARE KLEBSIELLA PNEUMONIAE NO ANAEROBES ISOLATED FEW ENTEROCOCCUS FAECALIS    REPTSTATUS 11/19/2022 FINAL 11/14/2022 0919   Medications:   ceFAZolin (ANCEF) IV 2 g (11/22/22 1802)    (feeding supplement) PROSource Plus  30 mL Oral BID BM   amiodarone  200 mg Oral Daily   apixaban  5 mg Oral BID   ascorbic acid  500 mg Oral Daily    atorvastatin  80 mg Oral Daily   Chlorhexidine Gluconate Cloth  6 each Topical BID   feeding supplement (NEPRO CARB STEADY)  237 mL Oral BID BM   folic acid  1 mg Oral Daily   insulin aspart  0-6 Units Subcutaneous TID WC   insulin glargine-yfgn  22 Units Subcutaneous QHS   insulin glargine-yfgn  40 Units Subcutaneous Daily   levothyroxine  75 mcg Oral QAC breakfast   LORazepam  0.5 mg Oral QHS   midodrine  10 mg Oral Q T,Th,Sa-HD   multivitamin  1 tablet Oral QHS   pantoprazole  40 mg Oral BID AC   polyethylene glycol  17 g Oral Daily   ramelteon  8 mg Oral QHS   senna-docusate  2 tablet Oral BID   sevelamer carbonate  1,600 mg Oral TID WC   silver sulfADIAZINE   Topical BID   venlafaxine XR  75 mg Oral Q breakfast   zinc sulfate  220 mg Oral Daily    Dialysis Orders: [facility] TTS  4hr, 400/800, EDW 91.5kg, 2K/2Ca, TDC, heparin 4000U bolus  - Mircera 75 mcg IV q 2 weeks  - last 6/8 - Calcitriol 0.5 three times per week  Assessment/Plan: LLE wound infection: With maggot infestation on admission. Blood Cx negative. S/p OR debridement/wound vac on 6/21. Wound Cx grew Proteus + Klebsiella + Enterococcus: Plan is for Cefazolin 2g q HD until 11/28/22. ESRD: Usual TTS schedule. Having orthopnea + SOB requiring O2 today -> will plan HD today. Thrombosed AVF - not salvageable, TDC in use. For new access in near future. Hypotension/volume: BP low - gets midodrine pre-HD. + SOB/orthopnea and 6kg up per scale  - will ^ UFG.   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   Metabolic bone disease: CorrCa up slightly, continue Renvela as binder. VDRA on hold.   Nutrition: Alb low, continue supps. AFib - on amiodarone/Eliquis  T2DM - historically poorly controlled. Insulin per primary AMS/Twitching - felt secondary to Lyrica toxicity in ESRD -> discontinued. Dispo: In CIR.  Ozzie Hoyle, PA-C 11/24/2022, 11:08 AM  [facility]  Nephrology attending: I  have personally seen and examined the patient.  Chart reviewed I agree with the note above.  ESRD on HD, undergoing inpatient rehab.  Increased work of breathing and short of breath today therefore agree with HD today.  Eddie North, [facility].

## 2022-11-24 NOTE — Progress Notes (Signed)
Received patient in bed.Awake ,alert and oriented x 4.  Access used : Right Hd catheter that worked well.Dressing on date.  Medicine given: Heparin 2,000 units ,pre-run dose.  Duration of treatment :2.36  UF GOAL : 2.5 liters.  Hemo comment: Treatment reduced as per order of Renal MD on call due to emergent case.  Hand off to the patient's nurse.

## 2022-11-24 NOTE — Progress Notes (Signed)
Occupational Therapy Session Note  Patient Details  Name: Susan [name] MRN: [id number] Date of Birth: September 17, 1950  Today's Date: 11/24/2022 OT Individual Time: 1100-1200 OT Individual Time Calculation (min): 60 min    Short Term Goals: Week 1:  OT Short Term Goal 1 (Week 1): Pt will complete UB seflcare EOB unsupported with CGA OT Short Term Goal 2 (Week 1): Pt will access DABSC via TB with 2 helpers OT Short Term Goal 3 (Week 1): Pt will trial sit to stand in STEDY frame with 2 helper assist  Skilled Therapeutic Interventions/Progress Updates:   Pt up in w/c upon OT arrival. Pt reported info below re: pain and feeling cold in room. OT offered outdoor therapy and pt eager. OT applied O2 from wall to portable 2 ltrs via [place] and transported to courtyard for energy conservation for treatment. Pt able to self propel 25 ft with decreased rate due to B shoulder weakness and need for protection x 2 sets with rest break between. Educated on pressure reliefs laterally for skin protection of limbs and buttocks/sacral region. Pt issued and trained in theraputty (beige- extra soft level) for hand skills as pt reports numbness and sensory loss as well as coordinator deficits demonstrated. Pt able to complete 3 sets of grasp, rolling, pinching while education conducted re: mirror therapy via video and demonstration. Once back in room, pt changed back to wall O2 and sats 98% post therapy. Left pt w/c level with needs, safety measures and nurse call button in reach.   Pain: 8/10 L and R LE's (wound area and residual limb and phantom limb pain) outdoor therapy for well-being, sunshine as pt reported being cold in room, with breathing, repositioning, distraction and B hand therapy exercises with report of 6/10 R LE and 7/10 L LE wound. Educated on mirror therapy and demonstrated with video in prep for next session trial   Therapy Documentation Precautions:  Precautions Precautions: Fall Precaution Comments: L  lateral lower leg open wound, R rotator cuff tears, Restrictions Weight Bearing Restrictions: Yes LLE Weight Bearing: Weight bearing as tolerated    Therapy/Group: Individual Therapy  Vicenta Dunning 11/24/2022, 10:19 AM

## 2022-11-25 ENCOUNTER — Other Ambulatory Visit: Payer: Self-pay | Admitting: *Deleted

## 2022-11-25 DIAGNOSIS — I480 Paroxysmal atrial fibrillation: Secondary | ICD-10-CM | POA: Diagnosis not present

## 2022-11-25 DIAGNOSIS — J9610 Chronic respiratory failure, unspecified whether with hypoxia or hypercapnia: Secondary | ICD-10-CM

## 2022-11-25 DIAGNOSIS — Z89511 Acquired absence of right leg below knee: Secondary | ICD-10-CM | POA: Diagnosis not present

## 2022-11-25 DIAGNOSIS — N186 End stage renal disease: Secondary | ICD-10-CM | POA: Diagnosis not present

## 2022-11-25 DIAGNOSIS — E1165 Type 2 diabetes mellitus with hyperglycemia: Secondary | ICD-10-CM | POA: Diagnosis not present

## 2022-11-25 DIAGNOSIS — D649 Anemia, unspecified: Secondary | ICD-10-CM

## 2022-11-25 LAB — RENAL FUNCTION PANEL
Albumin: 2.6 g/dL — ABNORMAL LOW (ref 3.5–5.0)
Anion gap: 11 (ref 5–15)
BUN: 52 mg/dL — ABNORMAL HIGH (ref 8–23)
CO2: 26 mmol/L (ref 22–32)
Calcium: 8.9 mg/dL (ref 8.9–10.3)
Chloride: 97 mmol/L — ABNORMAL LOW (ref 98–111)
Creatinine, Ser: 4.3 mg/dL — ABNORMAL HIGH (ref 0.44–1.00)
GFR, Estimated: 10 mL/min — ABNORMAL LOW (ref >60)
Glucose, Bld: 187 mg/dL — ABNORMAL HIGH (ref 70–99)
Phosphorus: 4.4 mg/dL (ref 2.5–4.6)
Potassium: 4 mmol/L (ref 3.5–5.1)
Sodium: 134 mmol/L — ABNORMAL LOW (ref 135–145)

## 2022-11-25 LAB — CBC
HCT: 28.7 % — ABNORMAL LOW (ref 36.0–46.0)
Hemoglobin: 8.6 g/dL — ABNORMAL LOW (ref 12.0–15.0)
MCH: 23.8 pg — ABNORMAL LOW (ref 26.0–34.0)
MCHC: 30 g/dL (ref 30.0–36.0)
MCV: 79.5 fL — ABNORMAL LOW (ref 80.0–100.0)
Platelets: 213 10*3/uL (ref 150–400)
RBC: 3.61 MIL/uL — ABNORMAL LOW (ref 3.87–5.11)
RDW: 17.6 % — ABNORMAL HIGH (ref 11.5–15.5)
WBC: 8 10*3/uL (ref 4.0–10.5)
nRBC: 0 % (ref 0.0–0.2)

## 2022-11-25 LAB — GLUCOSE, CAPILLARY
Glucose-Capillary: 138 mg/dL — ABNORMAL HIGH (ref 70–99)
Glucose-Capillary: 130 mg/dL — ABNORMAL HIGH (ref 70–99)
Glucose-Capillary: 115 mg/dL — ABNORMAL HIGH (ref 70–99)
Glucose-Capillary: 118 mg/dL — ABNORMAL HIGH (ref 70–99)

## 2022-11-25 MED ORDER — DARBEPOETIN ALFA 150 MCG/0.3ML IJ SOSY
150.0000 ug | PREFILLED_SYRINGE | INTRAMUSCULAR | Status: DC
  Filled 2022-11-25: qty 0.3

## 2022-11-25 MED ORDER — CEFAZOLIN SODIUM-DEXTROSE 2-4 GM/100ML-% IV SOLN
2.0000 g | Freq: Once | INTRAVENOUS | Status: AC
  Administered 2022-11-25: 2 g via INTRAVENOUS
  Filled 2022-11-25: qty 100

## 2022-11-25 MED ORDER — HEPARIN SODIUM (PORCINE) 1000 UNIT/ML IJ SOLN
INTRAMUSCULAR | Status: AC
  Filled 2022-11-25: qty 4

## 2022-11-25 MED ORDER — CEFAZOLIN SODIUM-DEXTROSE 1-4 GM/50ML-% IV SOLN
1.0000 g | Freq: Once | INTRAVENOUS | Status: DC
  Filled 2022-11-25: qty 50

## 2022-11-25 MED ORDER — HEPARIN SODIUM (PORCINE) 1000 UNIT/ML DIALYSIS
2000.0000 [IU] | Freq: Once | INTRAMUSCULAR | Status: AC
  Administered 2022-11-25: 2000 [IU] via INTRAVENOUS_CENTRAL
  Filled 2022-11-25: qty 2

## 2022-11-25 NOTE — Procedures (Signed)
HD Note:  Some information was entered later than the data was gathered due to patient care needs. The stated time with the data is accurate.  Received patient in bed to unit.  Alert and oriented.  Informed consent signed and in chart.   TX duration: 4 hours  Patient tolerated well.  Transported back to the room  Alert, without acute distress.  Hand-off given to patient's nurse.   Access used: right upper chest HD catheter Access issues: None  Total UF removed: 4000 ml   [facility]

## 2022-11-25 NOTE — Progress Notes (Signed)
Occupational Therapy Session Note  Patient Details  Name: Susan [name] MRN: [id number] Date of Birth: December 14, 1950  Today's Date: 11/25/2022 OT Individual Time: 2951-8841 OT Individual Time Calculation (min): 72 min   Short Term Goals: Week 1:  OT Short Term Goal 1 (Week 1): Pt will complete UB seflcare EOB unsupported with CGA OT Short Term Goal 2 (Week 1): Pt will access DABSC via TB with 2 helpers OT Short Term Goal 3 (Week 1): Pt will trial sit to stand in STEDY frame with 2 helper assist  Skilled Therapeutic Interventions/Progress Updates:  Pt received resting in bed for skilled OT session with focus on BADL retraining. Pt agreeable to interventions, demonstrating overall pleasant mood. Pt reported 8/10 LLE pain, expressing improvement in pain with re-wrapping done by night-shift nursing. OT offering intermediate rest breaks and positioning suggestions throughout session to address pain/fatigue and maximize participation/safety in session.   Pt received and maintained on 2L O2 [place]. Pt requires A for threading pants, able to roll towards L with supervision, letting go of bed rail to bring pants over R hip, pt requires Min A to reach side-lying onto L side, unable to let go of bed rail to assist with donning. Pt comes to EOB with cuing for log-roll technique, requiring overall Mod A for sup>sit and bringing R-hip towards EOB.   Pt with increased dizziness upon sitting EOB for ~5 mins, requesting to return to supine for symptom alleviation. Vitals measured, noted below:  In supine: BP=124/46 HR=69 O2=100  Back seated EOB: BP=123/54 HR=72 O2=100  Seated EOB, pt re-educated on slide-board use/technique. Pt performs slide board transfer from elevated EOB (for even surface) >WC with overall heavy Mod A x2 for initiating transfer, fading to Min-Mod x2. Pt able to position hips in [employer] with cuing for use of arm-rests to push with BUE.  Pt rolled to sink-side, completing oral care and grooming  with retrieval of needed items.   Pt remained sitting in [facility] with all immediate needs met at end of session. Pt continues to be appropriate for skilled OT intervention to promote further functional independence.   Therapy Documentation Precautions:  Precautions Precautions: Fall Precaution Comments: L lateral lower leg open wound, R rotator cuff tears, Restrictions Weight Bearing Restrictions: Yes LLE Weight Bearing: Non weight bearing   Therapy/Group: Individual Therapy  Lou Cal, OTR/L, MSOT  11/25/2022, 6:00 AM

## 2022-11-25 NOTE — Progress Notes (Signed)
Physical Therapy Session Note  Patient Details  Name: Susan [name] MRN: [id number] Date of Birth: [birth year]  Today's Date: 11/25/2022 PT Individual Time: 6962-9528 PT Individual Time Calculation (min): 57 min   Short Term Goals: Week 1:  PT Short Term Goal 1 (Week 1): Pt will perform rolling in bed to either side with MinA. PT Short Term Goal 2 (Week 1): Pt will perform supine <> sit with overall ModA. PT Short Term Goal 3 (Week 1): Pt will perform sit<>stand with MaxA +2 and tolerate stance for >20sec. PT Short Term Goal 4 (Week 1): Pt will initiate slide board transfer training. PT Short Term Goal 5 (Week 1): Pt will complete FIST for outcome measure.  Skilled Therapeutic Interventions/Progress Updates:      Pt seated in WC upon arrival. Pt agreeable to therapy. Pt reports R LE 8/10 pain today, pt refused ice, pt agreeable to heat, pt reports pain reduced to 7/10 with heat.   Therapist assessed oxygen during session, pt 100% saturation on 2L o2, and 99% on RA. Continued session on RA with therapist frequently monitoring. Pt decreased to as low as 90, but between 93 and 99 with diaphragmatic breathing even during slideboard transfer   While hot pack on R UE, pt performed the following therex for B LE strength and ROM.   1x10 LAQ Bilaterally  1x10 marching bilaterally active assisted with L LE   L LE ankle pumps  B LE quad sets  Pt performed [place] while seated in WC at level 90 1x20 with L LE to facilitate active assisted hip flexion ROM and activation of hip extensors, with therapist utilizing self to push R side of kinetron down. Pt reports knee pain with pushing down.   Pt performed slide board transfer WC level mat table x2 in each direction with +1 min-mod A, with +2 stabilizing WC for safety.   Pt seated in WC at end of session with all needs within reach on RA.   Therapy Documentation Precautions:  Precautions Precautions: Fall Precaution Comments: L lateral lower  leg open wound, R rotator cuff tears, Restrictions Weight Bearing Restrictions: Yes LLE Weight Bearing: Non weight bearing   Therapy/Group: Individual Therapy  [facility] Ambrose [place], , DPT  11/25/2022, 7:38 AM

## 2022-11-25 NOTE — Progress Notes (Signed)
Occupational Therapy Session Note  Patient Details  Name: Susan [name] MRN: [id number] Date of Birth: 09/13/1950  Today's Date: 11/25/2022 OT Individual Time: 1045-1200 OT Individual Time Calculation (min): 75 min    Short Term Goals: Week 1:  OT Short Term Goal 1 (Week 1): Pt will complete UB seflcare EOB unsupported with CGA OT Short Term Goal 2 (Week 1): Pt will access DABSC via TB with 2 helpers OT Short Term Goal 3 (Week 1): Pt will trial sit to stand in STEDY frame with 2 helper assist  Skilled Therapeutic Interventions/Progress Updates:   Pt up in w/c upon OT arrival. Pt needed back to bed to attend HD after lunch. OT worked with + 2 TA to assist pt with set up of w/c and TB. OT instructed in safe strategy toward L side and provided increased time for pt to utilize maximal body alignment and weight shifts. Overall mod a performance. Pt then performed 4 scoots up the bed laterally with mod A. Mod a sit to supine. Pillows added for skin protection. OT instructed in weighted ball core exercises 10 reps x 3 sets. Then oblique core strength with lateral reaches for rings x 10 each side 3 sets. Cues for breathing strategies. Pt on RA this session with sats 90-99% depending on task. Encouraged to remain off but pt reports she feels "anxious and panicked" in HD without it. Team incl MD notified. Left with bd exit engaged, needs and nurse call button in reach.   Pain: 8/10 L LE upon OT arrival to pt up in w/c, repositioning, bed level in supine with pillow props with pain reduced to 6/10   Therapy Documentation Precautions:  Precautions Precautions: Fall Precaution Comments: L lateral lower leg open wound, R rotator cuff tears, Restrictions Weight Bearing Restrictions: Yes LLE Weight Bearing: Non weight bearing   Therapy/Group: Individual Therapy  Vicenta Dunning 11/25/2022, 7:41 AM

## 2022-11-25 NOTE — Progress Notes (Signed)
[facility] Progress Note   Subjective:   Seen in room - up in wheelchair in regular clothes. Breathing a little better - still a problem with minimal exertion. S/p "extra" HD yesterday, will be dialyzed again per usual schedule.  Objective Vitals:   11/24/22 1904 11/24/22 1947 11/24/22 1948 11/25/22 0552  BP: (!) 116/46 (!) 99/35 (!) 97/40 95/79  Pulse: 65 65  65  Resp: (!) 23 17  18   Temp: 97.7 F (36.5 C) 97.9 F (36.6 C)  98.2 F (36.8 C)  TempSrc:  Oral    SpO2: 98% 100% 100% 100%  Weight:      Height:       Physical Exam General: Chronically ill appearing woman, NAD. Nasal O2 in place Heart: RRR; no murmur Lungs: Faint bibasilar rales Abdomen: soft Extremities: Dense thigh/hip edema. R BKA, LLE bandaged Dialysis Access: [facility]  Additional Objective Labs: Basic Metabolic Panel: Recent Labs  Lab 11/19/22 0205 11/20/22 0116 11/22/22 0920 11/24/22 1413  NA 131* 132* 133* 134*  K 3.9 4.2 4.1 3.9  CL 95* 95* 96* 96*  CO2 25 26 27 26   GLUCOSE 207* 330* 273* 229*  BUN 42* 35* 49* 68*  CREATININE 3.97* 3.63* 4.34* 5.12*  CALCIUM 8.4* 8.9 9.2 9.0  PHOS 4.6  --  4.3 5.0*   Liver Function Tests: Recent Labs  Lab 11/19/22 0205 11/22/22 0920 11/24/22 1413  ALBUMIN 2.5* 2.6* 2.7*   CBC: Recent Labs  Lab 11/19/22 0205 11/20/22 0116 11/22/22 0920 11/24/22 1430  WBC 4.8 5.9 6.7 5.6  HGB 8.4* 8.5* 8.2* 8.7*  HCT 28.0* 28.2* 27.8* 29.6*  MCV 80.7 81.5 81.8 80.0  PLT 174 187 177 208   Medications:   ceFAZolin (ANCEF) IV 2 g (11/22/22 1802)    ceFAZolin (ANCEF) IV      (feeding supplement) PROSource Plus  30 mL Oral BID BM   amiodarone  200 mg Oral Daily   apixaban  5 mg Oral BID   ascorbic acid  500 mg Oral Daily   atorvastatin  80 mg Oral Daily   Chlorhexidine Gluconate Cloth  6 each Topical BID   darbepoetin (ARANESP) injection - DIALYSIS  150 mcg Subcutaneous Q Mon-1800   feeding supplement (NEPRO CARB STEADY)  237 mL Oral BID BM    folic acid  1 mg Oral Daily   insulin aspart  0-6 Units Subcutaneous TID WC   insulin glargine-yfgn  22 Units Subcutaneous QHS   insulin glargine-yfgn  40 Units Subcutaneous Daily   levothyroxine  75 mcg Oral QAC breakfast   LORazepam  0.5 mg Oral QHS   midodrine  10 mg Oral Q T,Th,Sa-HD   multivitamin  1 tablet Oral QHS   pantoprazole  40 mg Oral BID AC   polyethylene glycol  17 g Oral BID   ramelteon  8 mg Oral QHS   senna-docusate  2 tablet Oral BID   sevelamer carbonate  1,600 mg Oral TID WC   silver sulfADIAZINE   Topical BID   venlafaxine XR  75 mg Oral Q breakfast   zinc sulfate  220 mg Oral Daily    Dialysis Orders: [facility] TTS  4hr, 400/800, EDW 91.5kg, 2K/2Ca, TDC, heparin 4000U bolus  - Mircera 75 mcg IV q 2 weeks  - last 6/8 - Calcitriol 0.5 three times per week    Assessment/Plan: LLE wound infection: With maggot infestation on admission. Blood Cx negative. S/p OR debridement/wound vac on 6/21. Wound Cx grew Proteus + Klebsiella +  Enterococcus: Plan is for Cefazolin 2g q HD until 11/28/22. ESRD: Usual TTS schedule. "Extra" HD yesterday due to dyspnea/orthopnea - dialyzing again today per usual schedule.  Thrombosed AVF - not salvageable, TDC in use. For new access in near future. Hypotension/volume: BP low - gets midodrine pre-HD. + SOB/orthopnea and 4kg up per scale, has dense thigh edema - likely needs lower EDW on discharge. Goal 3.5-4L today. 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 Metabolic bone disease: CorrCa up slightly, continue Renvela as binder. VDRA on hold.   Nutrition: Alb low, continue supps. AFib - on amiodarone/Eliquis  T2DM - historically poorly controlled. Insulin per primary AMS/Twitching - felt secondary to Lyrica toxicity in ESRD -> discontinued. Dispo: In CIR.  Ozzie Hoyle, PA-C 11/25/2022, 10:54 AM  [employer]

## 2022-11-25 NOTE — Progress Notes (Signed)
Foam dressing and xeroform changed HS as well as  L shin wound per MD order. Pt complained of throbbing pain during HS, nurse re-wrapped ace wrap, suspected it was too tight earlier. Pt stated throbbing stopped later. No other needs at this time, call light within reach.

## 2022-11-25 NOTE — Progress Notes (Addendum)
PROGRESS NOTE   Subjective/Complaints: Patient had additional dialysis treatment yesterday.  Continues to report some orthopnea.  She has additional HD treatment scheduled for today.  She has been tolerating some time of nasal cannula O2 with therapy.  Reports she continues to have some pain at her left ankle wound.  ROS: Denies fever or chills, chest pain,  abd pain, nausea, vomiting, diarrhea,  new changes in motor or sensory function +SOB, + constipation  Objective:   No results found. Recent Labs    11/22/22 0920 11/24/22 1430  WBC 6.7 5.6  HGB 8.2* 8.7*  HCT 27.8* 29.6*  PLT 177 208    Recent Labs    11/22/22 0920 11/24/22 1413  NA 133* 134*  K 4.1 3.9  CL 96* 96*  CO2 27 26  GLUCOSE 273* 229*  BUN 49* 68*  CREATININE 4.34* 5.12*  CALCIUM 9.2 9.0     Intake/Output Summary (Last 24 hours) at 11/25/2022 0825 Last data filed at 11/24/2022 2025 Gross per 24 hour  Intake 472 ml  Output 2500 ml  Net -2028 ml      Pressure Injury 05/23/22 Buttocks Right Deep Tissue Pressure Injury - Purple or maroon localized area of discolored intact skin or blood-filled blister due to damage of underlying soft tissue from pressure and/or shear. (Active)  05/23/22 1715  Location: Buttocks  Location Orientation: Right  Staging: Deep Tissue Pressure Injury - Purple or maroon localized area of discolored intact skin or blood-filled blister due to damage of underlying soft tissue from pressure and/or shear.  Wound Description (Comments):   Present on Admission: Yes     Pressure Injury 08/27/22 Coccyx Mid Stage 2 -  Partial thickness loss of dermis presenting as a shallow open injury with a red, pink wound bed without slough. 60mmx5mmx0 red center unattached edges (Active)  08/27/22 1000  Location: Coccyx  Location Orientation: Mid  Staging: Stage 2 -  Partial thickness loss of dermis presenting as a shallow open injury with a  red, pink wound bed without slough.  Wound Description (Comments): 41mmx5mmx0 red center unattached edges  Present on Admission:     Physical Exam: Vital Signs Blood pressure 95/79, pulse 65, temperature 98.2 F (36.8 C), resp. rate 18, height 5\' 5"  (1.651 m), weight 95.5 kg, SpO2 100 %.  Physical Exam Constitutional: No apparent distress.  Laying in bed HENT: [place], AT, MMM Eyes: PERRLA. EOMI. Visual fields grossly intact.  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  Slight bibasilar Rales noted.  On 2L via [place], no increased WOB Abdomen: Soft, nontender, nondistended, +bowel sounds Skin:  + Right BKA dry dressing in place + Left calf wound wrapped in Ace bandage-clean and dry  Neurologic exam: Alert and awake, decreased sensation below distal thighs, cranial nerves II through XII grossly intact Right IJ TDC in place MSK: No abnormal tone noted      + Active range of motion in right shoulder limited in < 90 degrees flexion, abduction due to prior unrepaired shoulder dislocation      Strength:  RUE: 4/5 elbow flexion, extension, wrist, finger flexion, finger abduction                LUE: 4 out of 5 throughout                  LLE: 1/5 HF, 4/5 KE, 3/5 DF, 3/5 EHL, 4/5 PF                 RLE:  4/5 HF, 4/5 KE    Assessment/Plan: 1. Functional deficits which require 3+ hours per day of interdisciplinary therapy in a comprehensive inpatient rehab setting. Physiatrist is providing close team supervision and 24 hour management of active medical problems listed below. Physiatrist and rehab team continue to assess barriers to discharge/monitor patient progress toward functional and medical goals  Care Tool:  Bathing    Body parts bathed by patient: Right arm, Left arm, Chest, Face   Body parts bathed by helper: Buttocks, Front perineal area, Abdomen, Right upper leg, Left upper leg Body parts n/a: Right lower leg, Left lower leg    Bathing assist Assist Level: Dependent - Patient 0%     Upper Body Dressing/Undressing Upper body dressing   What is the patient wearing?: Hospital gown only    Upper body assist Assist Level: Maximal Assistance - Patient 25 - 49%    Lower Body Dressing/Undressing Lower body dressing      What is the patient wearing?: Incontinence brief, Hospital gown only, Ace wrap/stump shrinker     Lower body assist Assist for lower body dressing: Dependent - Patient 0%     Toileting Toileting    Toileting assist Assist for toileting: Dependent - Patient 0%     Transfers Chair/bed transfer  Transfers assist  Chair/bed transfer activity did not occur: Safety/medical concerns  Chair/bed transfer assist level: Dependent - mechanical lift     Locomotion Ambulation   Ambulation assist   Ambulation activity did not occur: Safety/medical concerns          Walk 10 feet activity   Assist  Walk 10 feet activity did not occur: Safety/medical concerns        Walk 50 feet activity   Assist Walk 50 feet with 2 turns activity did not occur: Safety/medical concerns         Walk 150 feet activity   Assist Walk 150 feet activity did not occur: Safety/medical concerns         Walk 10 feet on uneven surface  activity   Assist Walk 10 feet on uneven surfaces activity did not occur: Safety/medical concerns         Wheelchair     Assist Is the patient using a wheelchair?: Yes Type of Wheelchair: Manual    Wheelchair assist level: Dependent - Patient 0% Max wheelchair distance: 10 ft    Wheelchair 50 feet with 2 turns activity    Assist        Assist Level: Dependent - Patient 0%   Wheelchair 150 feet activity     Assist      Assist Level: Dependent - Patient 0%   Blood pressure 95/79, pulse 65, temperature 98.2 F (36.8 C), resp. rate 18, height 5\' 5"  (1.651 m), weight 95.5 kg, SpO2 100 %.  Medical Problem List and Plan: 1.  Functional deficits secondary to right BKA revision 1/24 and left leg wound             -patient may shower with wound covered             -  ELOS/Goals: 10-14 days, min to mod assist PT/OT -Was preparing for initial prosthetic fitting with [employer] prior to admission, they are aware she is coming to rehab, would coordinate trial of prosthetic device while at inpatient rehab   -Continue CIR 2.  Antithrombotics: -DVT/anticoagulation:  Pharmaceutical: Eliquis 5mg  BID             -antiplatelet therapy: N/A 3. Pain Management:  Tylenol, Robaxin, and Oxycodone prn.  -Patient had altered mental status and twitching suspected due to Lyrica toxicity prior to admission to CIR.  Lyrica was discontinued. -7/2 patient has been avoiding trying oxycodone due to constipation, discussed that we can give additional laxatives with this if needed.  We could try low-dose of Lyrica for phantom pain.  She is currently on Effexor. 4. Mood/Behavior/Sleep: LCSW to follow for evaluation and support.  --chronic insomnia--has Ambien prn w/ Ramelteron 8mg  QHS, and Lorazepam. -11/22/22 ambien not on orders; Lorazepam also not on orders, but will reorder 0.5mg  QHS PRN, also melatonin 5mg  QHS PRN, will hold off on ambien for now.  -11/23/22 poor sleep again, will schedule lorazepam for at bedtime, leave melatonin PRN to try, cont holding off on ambien for now but may want to consider restarting if sleep continues to be an issue.              -antipsychotic agents: N/A 5. Neuropsych/cognition: This patient is capable of making decisions on her own behalf. -11/23/22 pt requesting spiritual care/chaplain, ordered for this; also may benefit from neuropsych consult this week-- defer to weekday team -7/1 Seen by chaplain today 6. Skin/Wound Care: Monitor wound for healing.  --Wound VAC d/c due to concerns of wound getting macerated per Dr. Lajoyce Corners.  --Dry dressing changes and to add silvadene to wound bed if wound starts getting dry.  -6/28  VAC was removed last night Dr. Lajoyce Corners advises to "Apply small amount of silvadine pulse gauze and ace wrap. Change daily " -11/22/22 yellowish slough/drainage from wound bed, contacted Dr. Lajoyce Corners regarding this, will see if he wants BID dressing changes-- still pending response. Will f/up tomorrow. For now, doesn't look like worsening infection, can monitor closely. Will undress wound and re-photograph tomorrow -11/23/22 wound looks about the same or maybe a little better today, Dr. Lajoyce Corners apparently out of town per patient, will change to BID silvadene and dressing changes for now, to have closer eyes on wound, but may want to touch base with Dr. Lajoyce Corners this week to check if he would like to adjust anything.  7. Fluids/Electrolytes/Nutrition:  Strict I/O with 1200 cc FR. Monitor intake. Continue supplements/vitamins.  8. Kleb/Proteus/Enterococcus wound infection: Cefazolin for 2 weeks with EOT 11/29/22 per Dr. Thedore Mins 9.  T2DM: Hgb A1c-7.9 08/27/22 -Monitor BS ac/hs and use SSI for elevated BS. Titrate basal insulin as indicated.  -6/28 she is on Semglee 40 units in the morning and 20 units at bedtime.  Increase bedtime Semglee to 22 units -7/1 fair CBG control, continue current regimen an dmonitor --2 controlled continue current regimen CBG (last 3)  Recent Labs    11/24/22 0608 11/24/22 1159 11/25/22 0556  GLUCAP 106* 142* 138*     10. CAD/biventricular CHF: On GDMT limited due to hypotension.   --Continue Lipitor 80mg  QD   11. ESRD: HD TTS at the end of the day to help with tolerance of therapy.  Thrombosed AVF not salvageable.                --has been on regular diet-->? Low salt  restrictions.   -6/28 nephrology following appreciate assistance, next HD tomorrow  -7/1 HD plans HD today due to orthopnea + SOB  7/2 patient has scheduled HD session again today, hopefully will have benefit to orthopnea although this may be anxiety related also 12. Depression with anxiety d/o, panic attacks: Now on  Venlafaxine XR 75mg  QD with Lorazepam at night.--lorazepam not ordered, will reorder 0.5mg  QHS PRN, changed to scheduled starting 11/23/22 as mentioned above.   13. PAF: On Eliquis and amiodarone 200mg  QD for rate control.   -7/1-2 HR stable in 60s, continue current     11/25/2022    5:52 AM 11/24/2022    7:48 PM 11/24/2022    7:47 PM  Vitals with BMI  Systolic 95 97 99  Diastolic 79 40 35  Pulse 65  65    14. Anemia of chronic disease:  -11/22/22 Hgb stable at 8.2 today, monitor labs  7/2 nephrology ordering Aranesp 15.   H/o GIB due to stercoral ulcer: Avoid constipation. Continue Protonix 40mg  BID, miralax QD , Senokot 2 tabs BID, and PRNs.   -11/23/22 last BM yesterday, monitor  -7/1 Small BM yesterday, increase miralax to BID for constipation   -7/2 last BM yesterday although small, continue sorbitol as needed 16. Morbid Obesity. Dietary education  -Body mass index is 35.04 kg/m. 17.  Hypotension  -Taking midodrine 10mg  prior to dialysis  -6/29-30/24 BPs stable, monitor  -7/2 consider more regular treatment of midodrine if orthostatic symptoms become limiting Vitals:   11/24/22 1423 11/24/22 1559 11/24/22 1630 11/24/22 1700  BP: (!) 125/52 (!) 124/47 (!) 152/67 (!) 138/52   11/24/22 1733 11/24/22 1802 11/24/22 1838 11/24/22 1854  BP: (!) 122/47 (!) 111/45 (!) 116/48 (!) 116/46   11/24/22 1904 11/24/22 1947 11/24/22 1948 11/25/22 0552  BP: (!) 116/46 (!) 99/35 (!) 97/40 95/79    18. Hypothyroidism: continue synthroid qAM 19. Oxygen dependence/chronic respiratory failure: pt states she uses PRN O2 at home, mostly related to anxiety; ordered O2 2L via [place] to maintain SpO2 >90%. Pt with clear lung sounds, doubt need for emergent work up today. Monitor  -Okay to trial increasing time off nasal cannula O2, provide reassurance    LOS: 5 days A FACE TO FACE EVALUATION WAS PERFORMED  Fanny Dance 11/25/2022, 8:25 AM

## 2022-11-26 DIAGNOSIS — Z89511 Acquired absence of right leg below knee: Secondary | ICD-10-CM | POA: Diagnosis not present

## 2022-11-26 DIAGNOSIS — D649 Anemia, unspecified: Secondary | ICD-10-CM | POA: Diagnosis not present

## 2022-11-26 DIAGNOSIS — I959 Hypotension, unspecified: Secondary | ICD-10-CM

## 2022-11-26 DIAGNOSIS — E1165 Type 2 diabetes mellitus with hyperglycemia: Secondary | ICD-10-CM | POA: Diagnosis not present

## 2022-11-26 DIAGNOSIS — I480 Paroxysmal atrial fibrillation: Secondary | ICD-10-CM | POA: Diagnosis not present

## 2022-11-26 LAB — GLUCOSE, CAPILLARY
Glucose-Capillary: 82 mg/dL (ref 70–99)
Glucose-Capillary: 176 mg/dL — ABNORMAL HIGH (ref 70–99)
Glucose-Capillary: 158 mg/dL — ABNORMAL HIGH (ref 70–99)
Glucose-Capillary: 121 mg/dL — ABNORMAL HIGH (ref 70–99)

## 2022-11-26 MED ORDER — SORBITOL 70 % SOLN: 45.0000 mL | Freq: Once | Status: DC

## 2022-11-26 MED ORDER — OXYCODONE HCL 5 MG PO TABS
2.5000 mg | ORAL_TABLET | ORAL | Status: DC | PRN
  Administered 2022-11-26 (×2): 2.5 mg via ORAL
  Filled 2022-11-26 (×2): qty 1

## 2022-11-26 MED ORDER — PROCHLORPERAZINE MALEATE 5 MG PO TABS
5.0000 mg | ORAL_TABLET | Freq: Four times a day (QID) | ORAL | Status: DC | PRN
  Administered 2022-12-05 – 2022-12-07 (×2): 5 mg via ORAL
  Filled 2022-11-26 (×3): qty 1

## 2022-11-26 MED ORDER — DARBEPOETIN ALFA 150 MCG/0.3ML IJ SOSY
150.0000 ug | PREFILLED_SYRINGE | INTRAMUSCULAR | Status: DC
  Administered 2022-11-26 – 2022-12-10 (×3): 150 ug via SUBCUTANEOUS
  Filled 2022-11-26 (×3): qty 0.3

## 2022-11-26 NOTE — Patient Care Conference (Signed)
Inpatient RehabilitationTeam Conference and Plan of Care Update Date: 11/26/2022   Time: 12:27 PM    Patient Name: Susan [name] [facility]      Medical Record Number: [id number]  Date of Birth: 09/22/1950 Sex: Female         Room/Bed: 4M08C/4M08C-01 Payor Info: Payor: Arna [employer] ADVANTAGE / Plan: [employer] PPO / Product Type: *No Product type* /    Admit Date/Time:  11/20/2022  5:28 PM  Primary Diagnosis:  Right below-knee amputee [facility])  Hospital Problems: Principal Problem:   Right below-knee amputee [facility]) Active Problems:   Acute on chronic anemia    Expected Discharge Date: Expected Discharge Date: 12/12/22  Team Members Present: Physician leading conference: Dr. Fanny Dance Social Worker Present: Dossie Der, LCSW Nurse Present: Chana Bode, RN PT Present: Ambrose Finland, PT OT Present: Lou Cal, OT PPS Coordinator present : Fae Pippin, SLP     Current Status/Progress Goal Weekly Team Focus  Bowel/Bladder   Pt i oliguric and incontinent of B/B LBM: 11/24/22   pt regain continence of bowel   timed toileting qshift and PRN    Swallow/Nutrition/ Hydration               ADL's   Setup for UB dressing & Min A for UB bathing, Max A for LB dressing at bed-level (able to roll to assist with Min-Mod A), bed-level toileting   S UB, min A LB, mod A slide board transfers to [facility]   Slide board technique and practice for carryover into DABSC transfer.    Mobility   rolling min A to L-mod A to R, supine to sit with mod A, sit<>supine supervision, slide board transfer with +1 min-mod A, unable to fully stand with RW or // bars with +2 max A   mod to max A  barriers to discharge: anxiety, fear of falling,  B UE, L LE pain pain, measurements for pt car, increase strength, balance, postural control    Communication                Safety/Cognition/ Behavioral Observations               Pain   pt states she has pain of 4-7/10 but denies any pain  medications   pt remain free of pain   pain assessment qshift and PRN    Skin   pt has stage 2 on sacrum/coccyx, DTI on sacrum,MASD, IASD, Closed incision L leg, Open L shin diabetic ulcer, R BKA   Promote healing with current wounds and prevent new ones from forming  skin assessment qshift and PRN      Discharge Planning:  Home with husband who was on FMLA but has returned to work until pt is discharged from CIR. Has all DME from past admissons. Main goal is transfers and car transfers so will not need to use private transport for MD appointments-due to cost   Team Discussion: Patient with hypotension post right BKA, stage 2 on sacrum and leg wound. Reporting pain but refusing medication. Constipation addressed but limited by bad shoulders (fistula in one and RTC in the other) with fear of falling.  Patient on target to meet rehab goals: Currently needs min assist for use of slide-board to the right and mod assist for transfers to the left. Needs set up for upper body care but max assist for lower body at the bed level.   *See Care Plan and progress notes for long and short-term goals.   Revisions  to Treatment Plan:  N/a   Teaching Needs: Safety, medications, skin care, dietary modifications, transfers, etc.   Current Barriers to Discharge: Decreased caregiver support, Home enviroment access/layout, and Hemodialysis  Possible Resolutions to Barriers: Family education     Medical Summary Current Status: R BKA, L leg wound, anxiety, pain, ESRD  Barriers to Discharge: Complicated Wound;Medical stability;Renal Insufficiency/Failure;Uncontrolled Pain;Incontinence  Barriers to Discharge Comments: R BKA, L leg wound, anxiety, pain, ESRD Possible Resolutions to [employer] Focus: Pain medications if she is agreeable, monitor CBG, continue HD per renal, O2 PRN, sorbitol today   Continued Need for Acute Rehabilitation Level of Care: The patient requires daily medical management  by a physician with specialized training in physical medicine and rehabilitation for the following reasons: Direction of a multidisciplinary physical rehabilitation program to maximize functional independence : Yes Medical management of patient stability for increased activity during participation in an intensive rehabilitation regime.: Yes Analysis of laboratory values and/or radiology reports with any subsequent need for medication adjustment and/or medical intervention. : Yes   I attest that I was present, lead the team conference, and concur with the assessment and plan of the team.   Chana Bode B 11/26/2022, 3:27 PM

## 2022-11-26 NOTE — Progress Notes (Signed)
Patient ID: Susan [name], female   DOB: Nov 28, 1950, 72 y.o.   MRN: [id number]  Met with pt and spoke with husband via telephone to discuss team conference goals of min-mod level and the goal to use a slide board to be able to get in and out of the car, so private [name] transport will not be needed and will be able to use their car to get to follow up appointments. Husband was very appreciative of the care and rehab she is getting here. He states: "She did not do much at the nursing home stayed in the bed most of the time." Will continue to work on discharge needs. Husband to bring her wheelchair from home in in case we need to get her a speciality chair and to see what she has at home.

## 2022-11-26 NOTE — Progress Notes (Signed)
Occupational Therapy Weekly Progress Note  Patient Details  Name: Susan [name] MRN: [id number] Date of Birth: 08-02-1950  Beginning of progress report period: November 21, 2022 End of progress report period: November 26, 2022  Today's Date: 11/26/2022 OT Individual Time: 0715-0815 OT Individual Time Calculation (min): 60 min  and Today's Date: 11/26/2022 OT Missed Time: 10 Minutes Missed Time Reason:  (Eating)   Patient has met 3 of 3 short term goals this reporting period. Pt currently functioning at Min A for UB ADLs at EOB, and closer to Max A for LB ADLs at bed-level. Pt able to roll towards R-side with cuing, requiring Mod A to roll towards L-side all to assist with bed-level ADLs. Pt progressing towards slide-board transfers with overall Mod-Max A + 2 (for initiation of transfer). Caregiver present intermediately, official training to be scheduled closer to discharge.   Patient continues to demonstrate the following deficits: muscle weakness, decreased cardiorespiratoy endurance, and decreased sitting balance, decreased postural control, and decreased balance strategies and therefore will continue to benefit from skilled OT intervention to enhance overall performance with BADL and Reduce care partner burden.  Patient progressing toward long term goals..  Continue plan of care.  OT Short Term Goals Week 1:  OT Short Term Goal 1 (Week 1): Pt will complete UB seflcare EOB unsupported with CGA OT Short Term Goal 1 - Progress (Week 1): Met OT Short Term Goal 2 (Week 1): Pt will access DABSC via TB with 2 helpers OT Short Term Goal 2 - Progress (Week 1): Met OT Short Term Goal 3 (Week 1): Pt will trial sit to stand in STEDY frame with 2 helper assist OT Short Term Goal 3 - Progress (Week 1): Met Week 2:  OT Short Term Goal 1 (Week 2): Pt will don LB garments with Mod A. OT Short Term Goal 2 (Week 2): Pt will perform UB grooming/hygiene at sink-side with setup A. OT Short Term Goal 3 (Week 2): Pt  will perform slide board transfer with +1 Mod A.  Skilled Therapeutic Interventions/Progress Updates:   Pt politely requesting time to eat breakfast, missing ~10 mins of skilled OT intervention.   Pt received resting in bed for skilled OT session with focus on BADL participation. Pt agreeable to interventions, despite demonstrating overall anxious mood/affect. Pt with un-rated pain in LLE, stating "it's throbbing", but refusing pain medication. OT offering intermediate rest breaks and positioning suggestions throughout session to address pain/fatigue and maximize participation/safety in session.   Pt rolls in bed with overall cuing for technique and intermediate Min A, all to assist with donning pants, requiring overall Max A. Pt comes to EOB with cuing for log-roll technique and Mod A for BLE management and trunk elevation. Pt requires Min A to bring R-hip towards EOB. Pt dons shirt sitting EOB with item-retrieval. O2 removed at this point in preparation for functional transfers. +2 present transfers. Pt requires A for placement of board and overall heavy Mod A for EOB>BSC transfer, with extra time provided for anxiety management and consistent cuing for safe hand placement. Similar approach provided for BSC>WC.   Pt remained sitting in [facility] with all immediate needs met at end of session. Pt continues to be appropriate for skilled OT intervention to promote further functional independence.   Therapy Documentation Precautions:  Precautions Precautions: Fall Precaution Comments: L lateral lower leg open wound, R rotator cuff tears, Restrictions Weight Bearing Restrictions: Yes LLE Weight Bearing: Non weight bearing   Therapy/Group: Individual Therapy  Lou Cal, OTR/L, MSOT  11/26/2022, 5:10 AM

## 2022-11-26 NOTE — Progress Notes (Signed)
Occupational Therapy Session Note  Patient Details  Name: Susan [name] MRN: [id number] Date of Birth: January 24, 1951  Today's Date: 11/26/2022 OT Individual Time: 4098-1191 OT Individual Time Calculation (min): 44 min    Short Term Goals: Week 1:  OT Short Term Goal 1 (Week 1): Pt will complete UB seflcare EOB unsupported with CGA OT Short Term Goal 1 - Progress (Week 1): Met OT Short Term Goal 2 (Week 1): Pt will access DABSC via TB with 2 helpers OT Short Term Goal 2 - Progress (Week 1): Met OT Short Term Goal 3 (Week 1): Pt will trial sit to stand in STEDY frame with 2 helper assist OT Short Term Goal 3 - Progress (Week 1): Met  Skilled Therapeutic Interventions/Progress Updates:   Pt seen for skilled OT session with pt on EOM completing PT session upon OT arrival to gym. OT addressed lateral scoots to L side which is intact LE and more difficult according to pt and PT. Pt provided ample time and space to motor plan body with weight shift for board placement and scoots to L side. Required close S for 2 scoots then mod A for the remained with 2nd hand assist to steady board and chair as pt did not clear and moved board out of position. OT transported pt back to room as per her request due to c/o stomach "queasiness". Pt requested balc to bed for rest. OT called for +2 NT S but not required only min A fading to CGA to R residual limb side. Left pt bed level with pillow props under LE's for skin protection, needs and nurse call button in reach.   Pain: 7/10 report of pain in L LE while on EOM, back to chair then to bed with pillow props for elevation relief down to 6/10 L LE  Therapy Documentation Precautions:  Precautions Precautions: Fall Precaution Comments: L lateral lower leg open wound, R rotator cuff tears, Restrictions Weight Bearing Restrictions: Yes LLE Weight Bearing: Non weight bearing    Therapy/Group: Individual Therapy  Vicenta Dunning 11/26/2022, 7:41 AM

## 2022-11-26 NOTE — Progress Notes (Addendum)
[facility] Progress Note   Subjective:  Seen in room - off [place] oxygen now. S/p HD yesterday with 4L off. Breathing improved but not to baseline. No other concerns.  Objective Vitals:   11/25/22 1802 11/25/22 1913 11/25/22 2022 11/26/22 0547  BP: (!) 118/48 (!) 125/49 (!) 130/49 (!) 96/36  Pulse: 62 64 63 65  Resp: 12 17 16 18   Temp: (!) 97.4 F (36.3 C) 98 F (36.7 C) 98.3 F (36.8 C) 97.7 F (36.5 C)  TempSrc:  Oral Oral   SpO2: 100% 100% 100% 100%  Weight: 92.7 kg     Height:       Physical Exam General: Chronically ill appearing woman, NAD. Room air. Heart: RRR; no murmur Lungs: CTAB; no rales Abdomen: soft Extremities: Dense thigh/hip edema. R BKA, LLE bandaged Dialysis Access: [facility]  Additional Objective Labs: Basic Metabolic Panel: Recent Labs  Lab 11/22/22 0920 11/24/22 1413 11/25/22 1330  NA 133* 134* 134*  K 4.1 3.9 4.0  CL 96* 96* 97*  CO2 27 26 26   GLUCOSE 273* 229* 187*  BUN 49* 68* 52*  CREATININE 4.34* 5.12* 4.30*  CALCIUM 9.2 9.0 8.9  PHOS 4.3 5.0* 4.4   Liver Function Tests: Recent Labs  Lab 11/22/22 0920 11/24/22 1413 11/25/22 1330  ALBUMIN 2.6* 2.7* 2.6*   CBC: Recent Labs  Lab 11/20/22 0116 11/22/22 0920 11/24/22 1430 11/25/22 1330  WBC 5.9 6.7 5.6 8.0  HGB 8.5* 8.2* 8.7* 8.6*  HCT 28.2* 27.8* 29.6* 28.7*  MCV 81.5 81.8 80.0 79.5*  PLT 187 177 208 213   Medications:   ceFAZolin (ANCEF) IV 2 g (11/25/22 2155)    (feeding supplement) PROSource Plus  30 mL Oral BID BM   amiodarone  200 mg Oral Daily   apixaban  5 mg Oral BID   ascorbic acid  500 mg Oral Daily   atorvastatin  80 mg Oral Daily   Chlorhexidine Gluconate Cloth  6 each Topical BID   darbepoetin (ARANESP) injection - DIALYSIS  150 mcg Subcutaneous Q Tue-1800   feeding supplement (NEPRO CARB STEADY)  237 mL Oral BID BM   folic acid  1 mg Oral Daily   insulin aspart  0-6 Units Subcutaneous TID WC   insulin glargine-yfgn  22 Units Subcutaneous QHS    insulin glargine-yfgn  40 Units Subcutaneous Daily   levothyroxine  75 mcg Oral QAC breakfast   LORazepam  0.5 mg Oral QHS   midodrine  10 mg Oral Q T,Th,Sa-HD   multivitamin  1 tablet Oral QHS   pantoprazole  40 mg Oral BID AC   polyethylene glycol  17 g Oral BID   ramelteon  8 mg Oral QHS   senna-docusate  2 tablet Oral BID   sevelamer carbonate  1,600 mg Oral TID WC   silver sulfADIAZINE   Topical BID   venlafaxine XR  75 mg Oral Q breakfast   zinc sulfate  220 mg Oral Daily    Dialysis Orders: [facility] TTS  4hr, 400/800, EDW 91.5kg, 2K/2Ca, TDC, heparin 4000U bolus  - Mircera 75 mcg IV q 2 weeks  - last 6/8 - Calcitriol 0.5 three times per week    Assessment/Plan: LLE wound infection: With maggot infestation on admission. Blood Cx negative. S/p OR debridement/wound vac on 6/21. Wound Cx grew Proteus + Klebsiella + Enterococcus: Plan is for Cefazolin 2g q HD until 11/28/22. ESRD: Usual TTS schedule. "Extra" HD 7/1 due to dyspnea/orthopnea, then back to usual schedule - next  tomorrow (7/4). Thrombosed AVF - not salvageable, TDC in use. For new access in near future. Hypotension/volume: BP low - gets midodrine pre-HD. + SOB/orthopnea earlier this week - some improvement. Has dense thigh edema. Keep challenging, needs lower EDW on discharge.  Anemia of ESRD: Hgb 8.2 - overdue for Aranesp. Has been ordered and missed twice now - re-order. Metabolic bone disease: CorrCa up slightly, continue Renvela as binder. VDRA on hold.   Nutrition: Alb low, continue supps. AFib - on amiodarone/Eliquis  T2DM - historically poorly controlled. Insulin per primary AMS/Twitching - felt secondary to Lyrica toxicity in ESRD -> discontinued. Dispo: In CIR.    Susan Hoyle, PA-C 11/26/2022, 10:01 AM  [facility]  Nephrology attending I have personally seen and examined the patient BHR B.  I agree with the assessment and plan as above.  The breathing is much better now.  4 L UF yesterday.   Plan for next dialysis tomorrow.  Discussed about salt and fluid restriction.  Continue inpatient rehab.  Eddie North, [facility].

## 2022-11-26 NOTE — Progress Notes (Addendum)
PROGRESS NOTE   Subjective/Complaints: Pt seen with orthopaedic PA, wound was evaluated. She reports she feels fatigued from dialysis yesterday.  LBM small on 7/1.   ROS: Denies fever or chills, chest pain,  abd pain, nausea, vomiting, diarrhea,  new changes in motor or sensory function +SOB- a little improved, + constipation  Objective:   No results found. Recent Labs    11/24/22 1430 11/25/22 1330  WBC 5.6 8.0  HGB 8.7* 8.6*  HCT 29.6* 28.7*  PLT 208 213    Recent Labs    11/24/22 1413 11/25/22 1330  NA 134* 134*  K 3.9 4.0  CL 96* 97*  CO2 26 26  GLUCOSE 229* 187*  BUN 68* 52*  CREATININE 5.12* 4.30*  CALCIUM 9.0 8.9     Intake/Output Summary (Last 24 hours) at 11/26/2022 1059 Last data filed at 11/25/2022 2030 Gross per 24 hour  Intake 120 ml  Output 4000 ml  Net -3880 ml      Pressure Injury 05/23/22 Buttocks Right Deep Tissue Pressure Injury - Purple or maroon localized area of discolored intact skin or blood-filled blister due to damage of underlying soft tissue from pressure and/or shear. (Active)  05/23/22 1715  Location: Buttocks  Location Orientation: Right  Staging: Deep Tissue Pressure Injury - Purple or maroon localized area of discolored intact skin or blood-filled blister due to damage of underlying soft tissue from pressure and/or shear.  Wound Description (Comments):   Present on Admission: Yes     Pressure Injury 08/27/22 Coccyx Mid Stage 2 -  Partial thickness loss of dermis presenting as a shallow open injury with a red, pink wound bed without slough. 55mmx5mmx0 red center unattached edges (Active)  08/27/22 1000  Location: Coccyx  Location Orientation: Mid  Staging: Stage 2 -  Partial thickness loss of dermis presenting as a shallow open injury with a red, pink wound bed without slough.  Wound Description (Comments): 78mmx5mmx0 red center unattached edges  Present on Admission:      Physical Exam: Vital Signs Blood pressure (!) 96/36, pulse 65, temperature 97.7 F (36.5 C), resp. rate 18, height 5\' 5"  (1.651 m), weight 92.7 kg, SpO2 100 %.  Physical Exam Constitutional: No apparent distress.  Sitting in chair HENT: [place], AT, MMM Eyes: PERRLA. EOMI. Visual fields grossly intact.  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  Non-labored  On room air this AM. Abdomen: Soft, nontender, nondistended, +bowel sounds Skin:  + Right BKA dry dressing in place + Left calf wound - see image  Neurologic exam: Alert and awake, decreased sensation below distal thighs, cranial nerves II through XII grossly intact Right IJ TDC in place MSK: No abnormal tone noted      + Active range of motion in right shoulder limited in < 90 degrees flexion, abduction due to prior unrepaired shoulder dislocation      Strength:                RUE: 4/5 elbow flexion, extension, wrist, finger flexion, finger abduction                LUE: 4 out of  5 throughout                  LLE: 1/5 HF, 4/5 KE, 3/5 DF, 3/5 EHL, 4/5 PF                 RLE:  4/5 HF, 4/5 KE      Assessment/Plan: 1. Functional deficits which require 3+ hours per day of interdisciplinary therapy in a comprehensive inpatient rehab setting. Physiatrist is providing close team supervision and 24 hour management of active medical problems listed below. Physiatrist and rehab team continue to assess barriers to discharge/monitor patient progress toward functional and medical goals  Care Tool:  Bathing    Body parts bathed by patient: Right arm, Left arm, Chest, Face   Body parts bathed by helper: Buttocks, Front perineal area, Abdomen, Right upper leg, Left upper leg Body parts n/a: Right lower leg, Left lower leg   Bathing assist Assist Level: Dependent - Patient 0%     Upper Body Dressing/Undressing Upper body dressing   What is the patient wearing?: Hospital gown only     Upper body assist Assist Level: Maximal Assistance - Patient 25 - 49%    Lower Body Dressing/Undressing Lower body dressing      What is the patient wearing?: Incontinence brief, Hospital gown only, Ace wrap/stump shrinker     Lower body assist Assist for lower body dressing: Dependent - Patient 0%     Toileting Toileting    Toileting assist Assist for toileting: Dependent - Patient 0%     Transfers Chair/bed transfer  Transfers assist  Chair/bed transfer activity did not occur: Safety/medical concerns  Chair/bed transfer assist level: Dependent - mechanical lift     Locomotion Ambulation   Ambulation assist   Ambulation activity did not occur: Safety/medical concerns          Walk 10 feet activity   Assist  Walk 10 feet activity did not occur: Safety/medical concerns        Walk 50 feet activity   Assist Walk 50 feet with 2 turns activity did not occur: Safety/medical concerns         Walk 150 feet activity   Assist Walk 150 feet activity did not occur: Safety/medical concerns         Walk 10 feet on uneven surface  activity   Assist Walk 10 feet on uneven surfaces activity did not occur: Safety/medical concerns         Wheelchair     Assist Is the patient using a wheelchair?: Yes Type of Wheelchair: Manual    Wheelchair assist level: Dependent - Patient 0% Max wheelchair distance: 10 ft    Wheelchair 50 feet with 2 turns activity    Assist        Assist Level: Dependent - Patient 0%   Wheelchair 150 feet activity     Assist      Assist Level: Dependent - Patient 0%   Blood pressure (!) 96/36, pulse 65, temperature 97.7 F (36.5 C), resp. rate 18, height 5\' 5"  (1.651 m), weight 92.7 kg, SpO2 100 %.  Medical Problem List and Plan: 1. Functional deficits secondary to right BKA revision 1/24 and left leg wound             -patient may shower with wound covered             -ELOS/Goals: 10-14 days, min  to mod assist PT/OT -Was preparing for initial prosthetic fitting with [employer] prior to  admission, they are aware she is coming to rehab, would coordinate trial of prosthetic device while at inpatient rehab   -Continue CIR 2.  Antithrombotics: -DVT/anticoagulation:  Pharmaceutical: Eliquis 5mg  BID             -antiplatelet therapy: N/A 3. Pain Management:  Tylenol, Robaxin, and Oxycodone prn.  -Patient had altered mental status and twitching suspected due to Lyrica toxicity prior to admission to CIR.  Lyrica was discontinued. -7/2 patient has been avoiding trying oxycodone due to constipation, discussed that we can give additional laxatives with this if needed.  We could try low-dose of Lyrica for phantom pain.  She is currently on Effexor. -7/3 will adjust oxycodone to 2.5 to 1 mg PRN 4. Mood/Behavior/Sleep: LCSW to follow for evaluation and support.  --chronic insomnia--has Ambien prn w/ Ramelteron 8mg  QHS, and Lorazepam. -11/22/22 ambien not on orders; Lorazepam also not on orders, but will reorder 0.5mg  QHS PRN, also melatonin 5mg  QHS PRN, will hold off on ambien for now.  -11/23/22 poor sleep again, will schedule lorazepam for at bedtime, leave melatonin PRN to try, cont holding off on ambien for now but may want to consider restarting if sleep continues to be an issue.              -antipsychotic agents: N/A 5. Neuropsych/cognition: This patient is capable of making decisions on her own behalf. -11/23/22 pt requesting spiritual care/chaplain, ordered for this; also may benefit from neuropsych consult this week-- defer to weekday team -7/1 Seen by chaplain today 6. Skin/Wound Care: Monitor wound for healing.  --Wound VAC d/c due to concerns of wound getting macerated per Dr. Lajoyce Corners.  --Dry dressing changes and to add silvadene to wound bed if wound starts getting dry.  -6/28 VAC was removed last night Dr. Lajoyce Corners advises to "Apply small amount of silvadine pulse gauze and ace wrap. Change daily  " -11/22/22 yellowish slough/drainage from wound bed, contacted Dr. Lajoyce Corners regarding this, will see if he wants BID dressing changes-- still pending response. Will f/up tomorrow. For now, doesn't look like worsening infection, can monitor closely. Will undress wound and re-photograph tomorrow -11/23/22 wound looks about the same or maybe a little better today, Dr. Lajoyce Corners apparently out of town per patient, will change to BID silvadene and dressing changes for now, to have closer eyes on wound, but may want to touch base with Dr. Lajoyce Corners this week to check if he would like to adjust anything.  -7/3 PA with ortho evaluated her wound today, recommended just dry dressings for now, appreciate assistance 7. Fluids/Electrolytes/Nutrition:  Strict I/O with 1200 cc FR. Monitor intake. Continue supplements/vitamins.  8. Kleb/Proteus/Enterococcus wound infection: Cefazolin for 2 weeks with EOT 11/29/22 per Dr. Thedore Mins 9.  T2DM: Hgb A1c-7.9 08/27/22 -Monitor BS ac/hs and use SSI for elevated BS. Titrate basal insulin as indicated.  -6/28 she is on Semglee 40 units in the morning and 20 units at bedtime.  Increase bedtime Semglee to 22 units -7/1 fair CBG control, continue current regimen an dmonitor --7/3 controlled continue current regimen CBG (last 3)  Recent Labs    11/25/22 1914 11/25/22 2112 11/26/22 0624  GLUCAP 115* 118* 82     10. CAD/biventricular CHF: On GDMT limited due to hypotension.   --Continue Lipitor 80mg  QD   11. ESRD: HD TTS at the end of the day to help with tolerance of therapy.  Thrombosed AVF not salvageable.                --  has been on regular diet-->? Low salt restrictions.   -6/28 nephrology following appreciate assistance, next HD tomorrow  -7/1 HD plans HD today due to orthopnea + SOB  7/2 patient has scheduled HD session again today, hopefully will have benefit to orthopnea although this may be anxiety related also  7/3 SOB a little improved today- she reports it is at baseline 12.  Depression with anxiety d/o, panic attacks: Now on Venlafaxine XR 75mg  QD with Lorazepam at night.--lorazepam not ordered, will reorder 0.5mg  QHS PRN, changed to scheduled starting 11/23/22 as mentioned above.   13. PAF: On Eliquis and amiodarone 200mg  QD for rate control.   -7/1-3 HR stable in 60s, continue current     11/26/2022    5:47 AM 11/25/2022    8:22 PM 11/25/2022    7:13 PM  Vitals with BMI  Systolic 96 130 125  Diastolic 36 49 49  Pulse 65 63 64    14. Anemia of chronic disease:  -11/22/22 Hgb stable at 8.2 today, monitor labs  7/2 nephrology ordering Aranesp 15.   H/o GIB due to stercoral ulcer: Avoid constipation. Continue Protonix 40mg  BID, miralax QD , Senokot 2 tabs BID, and PRNs.   -11/23/22 last BM yesterday, monitor  -7/1 Small BM yesterday, increase miralax to BID for constipation   -7/2 last BM yesterday although small, continue sorbitol as needed  -7/3 sorbitol 45ml ordered 16. Morbid Obesity. Dietary education  -Body mass index is 34.01 kg/m. 17.  Hypotension  -Taking midodrine 10mg  prior to dialysis  -6/29-30/24 BPs stable, monitor  -7/2 consider more regular treatment of midodrine if orthostatic symptoms become limiting  -7/3 132/98 BP with PT today, continue to monitor Vitals:   11/25/22 1430 11/25/22 1500 11/25/22 1530 11/25/22 1600  BP: (!) 113/47 (!) 108/47 (!) 112/49 (!) 132/53   11/25/22 1630 11/25/22 1700 11/25/22 1730 11/25/22 1759  BP: (!) 130/56 (!) 127/52 (!) 129/51 (!) 119/49   11/25/22 1802 11/25/22 1913 11/25/22 2022 11/26/22 0547  BP: (!) 118/48 (!) 125/49 (!) 130/49 (!) 96/36    18. Hypothyroidism: continue synthroid qAM 19. Oxygen dependence/chronic respiratory failure: pt states she uses PRN O2 at home, mostly related to anxiety; ordered O2 2L via [place] to maintain SpO2 >90%. Pt with clear lung sounds, doubt need for emergent work up today. Monitor  -Okay to trial increasing time off nasal cannula O2, provide reassurance  -Tolerating  Off O2 this AM    LOS: 6 days A FACE TO FACE EVALUATION WAS PERFORMED  Fanny Dance 11/26/2022, 10:59 AM

## 2022-11-26 NOTE — Progress Notes (Signed)
Patient ID: Susan [name], female   DOB: [birth year], 72 y.o.   MRN: [id number] Patient up in the bedside chair this morning dressing from the left lower extremity ulcer removed.  Ulcer is 7.8 cm x 6.5 cm today with 5 mm of depth.  There is significant drainage and Silvadene burden.  There is no surrounding maceration or further skin breakdown.  Please see attached image.  Will hold off on Silvadene.  Order placed.  To have daily Dial soap cleansing may use normal saline for dressing changes and cleansing.  Dry dressings with 4 x 4's and Ace wrap.

## 2022-11-26 NOTE — Progress Notes (Addendum)
Physical Therapy Session Note  Patient Details  Name: Susan [name] MRN: [id number] Date of Birth: 1950/07/26  Today's Date: 11/26/2022 PT Individual Time: 7829-5621, 1330-1430 PT Individual Time Calculation (min): 44 min, 60 min   Short Term Goals: Week 1:  PT Short Term Goal 1 (Week 1): Pt will perform rolling in bed to either side with MinA. PT Short Term Goal 2 (Week 1): Pt will perform supine <> sit with overall ModA. PT Short Term Goal 3 (Week 1): Pt will perform sit<>stand with MaxA +2 and tolerate stance for >20sec. PT Short Term Goal 4 (Week 1): Pt will initiate slide board transfer training. PT Short Term Goal 5 (Week 1): Pt will complete FIST for outcome measure.  Skilled Therapeutic Interventions/Progress Updates:     Treatment Session 1  Pt seated in WC upon arrival. Pt agreeable to therapy. Pt reports Pt transported [facility] in [facility] for time and energy conservation. Pt reportsL LE 8/10 throbbing midway between leg where the wound is and tingling in L foot, pt reports she used to have it every once in a while but more frequent, notified MD, pt refusing pain medicine. Therapist opted to focus session on slide board transfers and activiites from seated versus working on standing. Pt reports stomach discomfort, notified nursing, nursing administered medication.   Pt performed slide board transfer to R WC to mat table, with min A and verbal and tactile cues provided for head hip ratio, UE positoining and technique for improved efficiency.   Pt performed components of FIST while seated EOM. Pt demos significant anxiety and fear of falling with components, and reports lightheadedness during assessment. Vitals assessed BP 132/98, HR 72, O2 97. Subsided with rest, but increased with continuance of FIST assessment. Pt reports likely due to anxiety. Pt requesting to lay down, pt performed sit<>supine with supervision and supine<>sit with mod A, verbal cues provided for technique. Pt not able  to tolerate lying on back on mat table 2/2 "table is hard"   Pt seated EOM in main gym at end of session. Passed off to OT for next session. Education provided to OT on symptoms pt reporting.   Treatment Session 2   Pt supine in bed upon arrival. Pt agreeable to therapy. Pt reports her stomach still isn't feeling well. Asked if pt needs to have a BM, pt unsure. Discussed doing slide board transfer to [facility], but therapist check brief first and pt incontinent.   Pt performed mass practice of rolling bilaterally for therapist to perform pericare and change diaper, progressed to supervision to R  with bed rail and supervision to min A for rolling L with bed rail. Nursing notified of need to change dressing as stool inside of dressing. Nursing present to change dressing. Pt donned pants with max A while rolling, pt unable to get adequate clearance for bridge technique 2/2 L LE pain. Pt requires max A to pull up pants with verbal cues for use of R UE on rail and L UE to pull up pants as pt has difficulty performing L UE shoulder extension 2/2 pain.   Pt performed slide board transfer to L x2 with verbal cues provided for technique, min-mod A with getting over tire and increased time, pt demos increased difficulty transfering to L in comparison to R and maintaining L LE on floor, verbal and tactile cues provided for forward trunk lean and head hip ratio and approximation provided to L LE.   Pt performed supine <>sit with mod A,  and sit<>supine with supervision, verbal cues provided for technique with empahsis on use of UE to push from bed/bed rail versus pulling on therapist.   Pt performed lateral trunk lean to L elbow x5 and R elbow x5 with use of elbow to push up from bed for core strength.   Pt scooted up in bed with use of B UE on bed rails and therapist providing approximation at L LE and verbal cues for scooting technique.   Pt supine in bed with bed alarm on, L LE elevated and needs within reach at  end of session.    Therapy Documentation Precautions:  Precautions Precautions: Fall Precaution Comments: L lateral lower leg open wound, R rotator cuff tears, Restrictions Weight Bearing Restrictions: Yes LLE Weight Bearing: Non weight bearing  Balance: Standardized Balance Assessment Standardized Balance Assessment: FIST Function in Sitting Test = FIST Anterior Nudge: superior sternum: Upper extremity support (performed on mat table) Posterior Nudge: between scapular spines: Upper extremity support Lateral Nudge: to dominant side at acromion: Upper extremity support Static Sitting: 30 seconds: Independent Sitting, Shake "No": left and right: Independent Sitting, Eyes Closed: 30 seconds: Independent Sitting, Lift Foot: dominant side, lift foot 1 inch twice: Upper extremity support Pick Up Object From Behind: object at midline, hands breadth posterior: Verbal cues/increased time Forward Reach: use dominant arm, must complete full motion: Verbal cues/increased time Lateral Reach: use dominant arm, clear opposite ischial tuberosity: Upper extremity support Pick Up Object From Floor: from between feet: Dependent Posterior Scooting: move backwards 2 inches: Verbal cues/increased time Anterior Scooting: move forward 2 inches: Verbal cues/increased time Lateral Scooting: move to dominent side 2 inches: Verbal cues/increased time FIST TOTAL SCORE: 37   Therapy/Group: Individual Therapy  [facility] [place], [place], DPT  11/26/2022, 11:07 AM

## 2022-11-27 DIAGNOSIS — M79605 Pain in left leg: Secondary | ICD-10-CM

## 2022-11-27 LAB — GLUCOSE, CAPILLARY
Glucose-Capillary: 45 mg/dL — ABNORMAL LOW (ref 70–99)
Glucose-Capillary: 83 mg/dL (ref 70–99)
Glucose-Capillary: 133 mg/dL — ABNORMAL HIGH (ref 70–99)
Glucose-Capillary: 83 mg/dL (ref 70–99)
Glucose-Capillary: 160 mg/dL — ABNORMAL HIGH (ref 70–99)
Glucose-Capillary: 134 mg/dL — ABNORMAL HIGH (ref 70–99)
Glucose-Capillary: 201 mg/dL — ABNORMAL HIGH (ref 70–99)

## 2022-11-27 MED ORDER — INSULIN GLARGINE-YFGN 100 UNIT/ML ~~LOC~~ SOLN
38.0000 [IU] | Freq: Every day | SUBCUTANEOUS | Status: DC
  Administered 2022-11-28: 38 [IU] via SUBCUTANEOUS
  Filled 2022-11-27 (×2): qty 0.38

## 2022-11-27 MED ORDER — INSULIN GLARGINE-YFGN 100 UNIT/ML ~~LOC~~ SOLN
16.0000 [IU] | Freq: Every day | SUBCUTANEOUS | Status: DC
  Administered 2022-11-27 – 2022-11-28 (×2): 16 [IU] via SUBCUTANEOUS
  Filled 2022-11-27 (×3): qty 0.16

## 2022-11-27 MED ORDER — HEPARIN SODIUM (PORCINE) 1000 UNIT/ML DIALYSIS
2000.0000 [IU] | Freq: Once | INTRAMUSCULAR | Status: AC
  Administered 2022-11-27: 2000 [IU] via INTRAVENOUS_CENTRAL
  Filled 2022-11-27: qty 2

## 2022-11-27 MED ORDER — GLUCOSE 40 % PO GEL: 2.0000 | ORAL | Status: AC

## 2022-11-27 MED ORDER — INSULIN GLARGINE-YFGN 100 UNIT/ML ~~LOC~~ SOLN: 36.0000 [IU] | Freq: Every day | SUBCUTANEOUS | Status: DC

## 2022-11-27 MED ORDER — INSULIN GLARGINE-YFGN 100 UNIT/ML ~~LOC~~ SOLN: 18.0000 [IU] | Freq: Every day | SUBCUTANEOUS | Status: DC

## 2022-11-27 NOTE — Progress Notes (Signed)
PROGRESS NOTE   Subjective/Complaints: Hypoglycemia this AM.  Reports pain doing better with use of 2.5mg  oxycodone and robaxin PRN.   ROS: Denies fever or chills, chest pain,  abd pain, nausea, vomiting, diarrhea,  new changes in motor or sensory function +SOB- a little improved, + constipation +left leg pain and phantom pain  Objective:   No results found. Recent Labs    11/24/22 1430 11/25/22 1330  WBC 5.6 8.0  HGB 8.7* 8.6*  HCT 29.6* 28.7*  PLT 208 213    Recent Labs    11/24/22 1413 11/25/22 1330  NA 134* 134*  K 3.9 4.0  CL 96* 97*  CO2 26 26  GLUCOSE 229* 187*  BUN 68* 52*  CREATININE 5.12* 4.30*  CALCIUM 9.0 8.9     Intake/Output Summary (Last 24 hours) at 11/27/2022 0849 Last data filed at 11/27/2022 0600 Gross per 24 hour  Intake 880 ml  Output 100 ml  Net 780 ml      Pressure Injury 05/23/22 Buttocks Right Deep Tissue Pressure Injury - Purple or maroon localized area of discolored intact skin or blood-filled blister due to damage of underlying soft tissue from pressure and/or shear. (Active)  05/23/22 1715  Location: Buttocks  Location Orientation: Right  Staging: Deep Tissue Pressure Injury - Purple or maroon localized area of discolored intact skin or blood-filled blister due to damage of underlying soft tissue from pressure and/or shear.  Wound Description (Comments):   Present on Admission: Yes     Pressure Injury 08/27/22 Coccyx Mid Stage 2 -  Partial thickness loss of dermis presenting as a shallow open injury with a red, pink wound bed without slough. 1mmx5mmx0 red center unattached edges (Active)  08/27/22 1000  Location: Coccyx  Location Orientation: Mid  Staging: Stage 2 -  Partial thickness loss of dermis presenting as a shallow open injury with a red, pink wound bed without slough.  Wound Description (Comments): 4mmx5mmx0 red center unattached edges  Present on Admission:      Physical Exam: Vital Signs Blood pressure (!) 140/63, pulse 69, temperature 97.9 F (36.6 C), temperature source Oral, resp. rate 17, height 5\' 5"  (1.651 m), weight 92.5 kg, SpO2 100 %.  Physical Exam Constitutional: No apparent distress.  Sitting in chair. Chronically ill appearing HENT: [place], AT, MMM Eyes: PERRLA. EOMI. Visual fields grossly intact.  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  Non-labored  On room air this AM. Abdomen: Soft, nontender, nondistended, +bowel sounds Skin:  + Right BKA dry dressing in place + Left calf wound - dry dressing in place  Neurologic exam: Alert and awake, decreased sensation below distal thighs, cranial nerves II through XII grossly intact Right IJ TDC in place MSK: No abnormal tone noted      + Active range of motion in right shoulder limited in < 90 degrees flexion, abduction due to prior unrepaired shoulder dislocation      Strength:               Moving all 4 extremities      Assessment/Plan: 1. Functional deficits which require 3+ hours per day of interdisciplinary  therapy in a comprehensive inpatient rehab setting. Physiatrist is providing close team supervision and 24 hour management of active medical problems listed below. Physiatrist and rehab team continue to assess barriers to discharge/monitor patient progress toward functional and medical goals  Care Tool:  Bathing    Body parts bathed by patient: Right arm, Left arm, Chest, Face   Body parts bathed by helper: Buttocks, Front perineal area, Abdomen, Right upper leg, Left upper leg Body parts n/a: Right lower leg, Left lower leg   Bathing assist Assist Level: Dependent - Patient 0%     Upper Body Dressing/Undressing Upper body dressing   What is the patient wearing?: Hospital gown only    Upper body assist Assist Level: Maximal Assistance - Patient 25 - 49%    Lower Body Dressing/Undressing Lower body dressing       What is the patient wearing?: Incontinence brief, Hospital gown only, Ace wrap/stump shrinker     Lower body assist Assist for lower body dressing: Dependent - Patient 0%     Toileting Toileting    Toileting assist Assist for toileting: Dependent - Patient 0%     Transfers Chair/bed transfer  Transfers assist  Chair/bed transfer activity did not occur: Safety/medical concerns  Chair/bed transfer assist level: Dependent - mechanical lift     Locomotion Ambulation   Ambulation assist   Ambulation activity did not occur: Safety/medical concerns          Walk 10 feet activity   Assist  Walk 10 feet activity did not occur: Safety/medical concerns        Walk 50 feet activity   Assist Walk 50 feet with 2 turns activity did not occur: Safety/medical concerns         Walk 150 feet activity   Assist Walk 150 feet activity did not occur: Safety/medical concerns         Walk 10 feet on uneven surface  activity   Assist Walk 10 feet on uneven surfaces activity did not occur: Safety/medical concerns         Wheelchair     Assist Is the patient using a wheelchair?: Yes Type of Wheelchair: Manual    Wheelchair assist level: Dependent - Patient 0% Max wheelchair distance: 10 ft    Wheelchair 50 feet with 2 turns activity    Assist        Assist Level: Dependent - Patient 0%   Wheelchair 150 feet activity     Assist      Assist Level: Dependent - Patient 0%   Blood pressure (!) 140/63, pulse 69, temperature 97.9 F (36.6 C), temperature source Oral, resp. rate 17, height 5\' 5"  (1.651 m), weight 92.5 kg, SpO2 100 %.  Medical Problem List and Plan: 1. Functional deficits secondary to right BKA revision 1/24 and left leg wound             -patient may shower with wound covered             -ELOS/Goals: 10-14 days, min to mod assist PT/OT -Was preparing for initial prosthetic fitting with [employer] prior to admission, they are  aware she is coming to rehab, would coordinate trial of prosthetic device while at inpatient rehab   -7/19 expected DC date 2.  Antithrombotics: -DVT/anticoagulation:  Pharmaceutical: Eliquis 5mg  BID             -antiplatelet therapy: N/A 3. Pain Management:  Tylenol, Robaxin, and Oxycodone prn.  -Patient had altered mental status and twitching  suspected due to Lyrica toxicity prior to admission to CIR.  Lyrica was discontinued. -7/2 patient has been avoiding trying oxycodone due to constipation, discussed that we can give additional laxatives with this if needed.  We could try low-dose of Lyrica for phantom pain.  She is currently on Effexor. -7/3 will adjust oxycodone to 2.5 to 5 mg PRN -7/4 doing better with 2.5 oxycodone and robaxin PRN, she reports minimal benefit to phantom pain with gabapentin/lyrica in the past 4. Mood/Behavior/Sleep: LCSW to follow for evaluation and support.  --chronic insomnia--has Ambien prn w/ Ramelteron 8mg  QHS, and Lorazepam. -11/22/22 ambien not on orders; Lorazepam also not on orders, but will reorder 0.5mg  QHS PRN, also melatonin 5mg  QHS PRN, will hold off on ambien for now.  -11/23/22 poor sleep again, will schedule lorazepam for at bedtime, leave melatonin PRN to try, cont holding off on ambien for now but may want to consider restarting if sleep continues to be an issue.              -antipsychotic agents: N/A 5. Neuropsych/cognition: This patient is capable of making decisions on her own behalf. -11/23/22 pt requesting spiritual care/chaplain, ordered for this; also may benefit from neuropsych consult this week-- defer to weekday team -7/1 Seen by chaplain today 6. Skin/Wound Care: Monitor wound for healing.  --Wound VAC d/c due to concerns of wound getting macerated per Dr. Lajoyce Corners.  --Dry dressing changes and to add silvadene to wound bed if wound starts getting dry.  -6/28 VAC was removed last night Dr. Lajoyce Corners advises to "Apply small amount of silvadine pulse  gauze and ace wrap. Change daily " -11/22/22 yellowish slough/drainage from wound bed, contacted Dr. Lajoyce Corners regarding this, will see if he wants BID dressing changes-- still pending response. Will f/up tomorrow. For now, doesn't look like worsening infection, can monitor closely. Will undress wound and re-photograph tomorrow -11/23/22 wound looks about the same or maybe a little better today, Dr. Lajoyce Corners apparently out of town per patient, will change to BID silvadene and dressing changes for now, to have closer eyes on wound, but may want to touch base with Dr. Lajoyce Corners this week to check if he would like to adjust anything.  -7/3 PA with ortho evaluated her wound today, recommended just dry dressings for now, appreciate assistance 7. Fluids/Electrolytes/Nutrition:  Strict I/O with 1200 cc FR. Monitor intake. Continue supplements/vitamins.  8. Kleb/Proteus/Enterococcus wound infection: Cefazolin for 2 weeks with EOT 11/29/22 per Dr. Thedore Mins 9.  T2DM: Hgb A1c-7.9 08/27/22 -Monitor BS ac/hs and use SSI for elevated BS. Titrate basal insulin as indicated.  -6/28 she is on Semglee 40 units in the morning and 20 units at bedtime.  Increase bedtime Semglee to 22 units --7/4 decrease Semglee to 38 units in AM, 16 in afternoon due to hypoglycemia event  CBG (last 3)  Recent Labs    11/27/22 0622 11/27/22 0636 11/27/22 0710  GLUCAP 83 83 133*    10. CAD/biventricular CHF: On GDMT limited due to hypotension.   --Continue Lipitor 80mg  QD   11. ESRD: HD TTS at the end of the day to help with tolerance of therapy.  Thrombosed AVF not salvageable.                --has been on regular diet-->? Low salt restrictions.   -6/28 nephrology following appreciate assistance, next HD tomorrow  -7/1 HD plans HD today due to orthopnea + SOB  7/2 patient has scheduled HD session again today, hopefully will have  benefit to orthopnea although this may be anxiety related also  7/3 SOB a little improved today- she reports it is at  baseline 12. Depression with anxiety d/o, panic attacks: Now on Venlafaxine XR 75mg  QD with Lorazepam at night.--lorazepam not ordered, will reorder 0.5mg  QHS PRN, changed to scheduled starting 11/23/22 as mentioned above.   13. PAF: On Eliquis and amiodarone 200mg  QD for rate control.   -7/1-4 HR stable in 60s, continue current     11/27/2022    7:00 AM 11/26/2022    9:27 PM 11/26/2022    2:35 PM  Vitals with BMI  Weight 203 lbs 15 oz  203 lbs 15 oz  BMI 33.93  33.93  Systolic  140 131  Diastolic  63 61  Pulse  69 73    14. Anemia of chronic disease:  -11/22/22 Hgb stable at 8.2 today, monitor labs  7/2 nephrology ordering Aranesp 15.   H/o GIB due to stercoral ulcer: Avoid constipation. Continue Protonix 40mg  BID, miralax QD , Senokot 2 tabs BID, and PRNs.   -11/23/22 last BM yesterday, monitor  -7/1 Small BM yesterday, increase miralax to BID for constipation   -7/2 last BM yesterday although small, continue sorbitol as needed  -7/3 sorbitol 45ml ordered  -7/4 large BM yesterday 16. Morbid Obesity. Dietary education  -Body mass index is 33.93 kg/m. 17.  Hypotension  -Taking midodrine 10mg  prior to dialysis  -6/29-30/24 BPs stable, monitor  -7/2 consider more regular treatment of midodrine if orthostatic symptoms become limiting  -7/4 stable today, continue to monitor Vitals:   11/25/22 1530 11/25/22 1600 11/25/22 1630 11/25/22 1700  BP: (!) 112/49 (!) 132/53 (!) 130/56 (!) 127/52   11/25/22 1730 11/25/22 1759 11/25/22 1802 11/25/22 1913  BP: (!) 129/51 (!) 119/49 (!) 118/48 (!) 125/49   11/25/22 2022 11/26/22 0547 11/26/22 1435 11/26/22 2127  BP: (!) 130/49 (!) 96/36 131/61 (!) 140/63    18. Hypothyroidism: continue synthroid qAM 19. Oxygen dependence/chronic respiratory failure: pt states she uses PRN O2 at home, mostly related to anxiety; ordered O2 2L via [place] to maintain SpO2 >90%. Pt with clear lung sounds, doubt need for emergent work up today. Monitor  -Okay to  trial increasing time off nasal cannula O2, provide reassurance  -Tolerating Off O2 this AM    LOS: 7 days A FACE TO FACE EVALUATION WAS PERFORMED  Fanny Dance 11/27/2022, 8:49 AM

## 2022-11-27 NOTE — Progress Notes (Signed)
   11/27/22 1817  Vitals  Temp 98 F (36.7 C)  Pulse Rate 67  Resp 13  BP (!) 142/65  SpO2 100 %  O2 Device Nasal Cannula  Weight 91 kg  Type of Weight Post-Dialysis  Oxygen Therapy  O2 Flow Rate (L/min) 2 L/min  Patient Activity (if Appropriate) In bed  Pulse Oximetry Type Continuous  Oximetry Probe Site Changed No  Post Treatment  Dialyzer Clearance Lightly streaked  Duration of HD Treatment -hour(s) 4 hour(s)  Hemodialysis Intake (mL) 0 mL  Liters Processed 96  Fluid Removed (mL) 4000 mL  Tolerated HD Treatment Yes   Received patient in bed to unit.  Alert and oriented.  Informed consent signed and in chart.   TX duration:4.0  Patient tolerated well.  Transported back to the room  Alert, without acute distress.  Hand-off given to patient's nurse.   Access used: LDLC Access issues: no complications  Total UF removed: 4000 Medication(s) given: none   [facility]

## 2022-11-27 NOTE — Progress Notes (Signed)
Occupational Therapy Note  Patient Details  Name: Susan [name] MRN: [id number] Date of Birth: 28-Apr-1951  Today's Date: 11/27/2022 OT Missed Time: 60 Minutes Missed Time Reason: Patient unwilling/refused to participate without medical reason  Pt resting in bed upon arrival. Pt declined therapy at this time, stating that her CBG was 43 earlier in the morning and she was still not feeling good. Will attempt to see pt as schedule allows.   Lavone Neri [facility] 11/27/2022, 10:18 AM

## 2022-11-27 NOTE — Progress Notes (Signed)
Physical Therapy Session Note  Patient Details  Name: Susan [name] MRN: [id number] Date of Birth: Aug 20, 1950  Today's Date: 11/27/2022 PT Missed Time: 60 Minutes Missed Time Reason: Patient fatigue;Patient unwilling to participate;Patient ill (Comment) (Pt reports lightheadedness)  Short Term Goals: Week 1:  PT Short Term Goal 1 (Week 1): Pt will perform rolling in bed to either side with MinA. PT Short Term Goal 2 (Week 1): Pt will perform supine <> sit with overall ModA. PT Short Term Goal 3 (Week 1): Pt will perform sit<>stand with MaxA +2 and tolerate stance for >20sec. PT Short Term Goal 4 (Week 1): Pt will initiate slide board transfer training. PT Short Term Goal 5 (Week 1): Pt will complete FIST for outcome measure.  Skilled Therapeutic Interventions/Progress Updates:      Pt supine in bed with HOB elevated upon arrivla. Pt refusing therapy 2/2 not feeling well. Therapist attempted bed level exercise, and participation in therapeutic holdiay celebration. Pt refused. Notified nurse. Pt in bed with bed alarm on and needs within reach.   Therapy Documentation Precautions:  Precautions Precautions: Fall Precaution Comments: L lateral lower leg open wound, R rotator cuff tears, Restrictions Weight Bearing Restrictions: Yes LLE Weight Bearing: Non weight bearing Therapy/Group: Individual Therapy  [facility] [place], [place], DPT  11/27/2022, 1:00 PM

## 2022-11-27 NOTE — Progress Notes (Addendum)
[facility] Progress Note   Subjective:  Seen in room - for PT soon. Wearing O2 this AM and reports had hypoglycemic episode this AM - BS ws 45, woke up drenched in sweat -- feels much better now. For HD later today.  Objective Vitals:   11/26/22 0547 11/26/22 1435 11/26/22 2127 11/27/22 0700  BP: (!) 96/36 131/61 (!) 140/63   Pulse: 65 73 69   Resp: 18 18 17    Temp: 97.7 F (36.5 C) 97.8 F (36.6 C) 97.9 F (36.6 C)   TempSrc:  Oral Oral   SpO2: 100% 98% 100%   Weight:  92.5 kg  92.5 kg  Height:       Physical Exam General: Chronically ill appearing woman, NAD. Nasal O2 in place Heart: RRR; no murmur Lungs: CTAB; no rales Abdomen: soft Extremities: Dense thigh/hip edema. R BKA, LLE bandaged Dialysis Access: [facility]  Additional Objective Labs: Basic Metabolic Panel: Recent Labs  Lab 11/22/22 0920 11/24/22 1413 11/25/22 1330  NA 133* 134* 134*  K 4.1 3.9 4.0  CL 96* 96* 97*  CO2 27 26 26   GLUCOSE 273* 229* 187*  BUN 49* 68* 52*  CREATININE 4.34* 5.12* 4.30*  CALCIUM 9.2 9.0 8.9  PHOS 4.3 5.0* 4.4   Liver Function Tests: Recent Labs  Lab 11/22/22 0920 11/24/22 1413 11/25/22 1330  ALBUMIN 2.6* 2.7* 2.6*   CBC: Recent Labs  Lab 11/22/22 0920 11/24/22 1430 11/25/22 1330  WBC 6.7 5.6 8.0  HGB 8.2* 8.7* 8.6*  HCT 27.8* 29.6* 28.7*  MCV 81.8 80.0 79.5*  PLT 177 208 213   Medications:   ceFAZolin (ANCEF) IV 2 g (11/25/22 2155)    (feeding supplement) PROSource Plus  30 mL Oral BID BM   amiodarone  200 mg Oral Daily   apixaban  5 mg Oral BID   ascorbic acid  500 mg Oral Daily   atorvastatin  80 mg Oral Daily   Chlorhexidine Gluconate Cloth  6 each Topical BID   darbepoetin (ARANESP) injection - DIALYSIS  150 mcg Subcutaneous Q Wed-1800   dextrose  2 Tube Oral STAT   feeding supplement (NEPRO CARB STEADY)  237 mL Oral BID BM   folic acid  1 mg Oral Daily   insulin aspart  0-6 Units Subcutaneous TID WC   insulin glargine-yfgn  16 Units  Subcutaneous QHS   [START ON 11/28/2022] insulin glargine-yfgn  38 Units Subcutaneous Daily   levothyroxine  75 mcg Oral QAC breakfast   LORazepam  0.5 mg Oral QHS   midodrine  10 mg Oral Q T,Th,Sa-HD   multivitamin  1 tablet Oral QHS   pantoprazole  40 mg Oral BID AC   polyethylene glycol  17 g Oral BID   ramelteon  8 mg Oral QHS   senna-docusate  2 tablet Oral BID   sevelamer carbonate  1,600 mg Oral TID WC   silver sulfADIAZINE   Topical BID   sorbitol  45 mL Oral Once   venlafaxine XR  75 mg Oral Q breakfast   zinc sulfate  220 mg Oral Daily    Dialysis Orders: [facility] TTS  4hr, 400/800, EDW 91.5kg, 2K/2Ca, TDC, heparin 4000U bolus  - Mircera 75 mcg IV q 2 weeks  - last 6/8 - Calcitriol 0.5 three times per week    Assessment/Plan: LLE wound infection: With maggot infestation on admission. Blood Cx negative. S/p OR debridement/wound vac on 6/21. Wound Cx grew Proteus + Klebsiella + Enterococcus: Plan is for Cefazolin  2g q HD until 11/28/22. ESRD: Usual TTS schedule. "Extra" HD 7/1 due to dyspnea/orthopnea, then back to usual schedule - next today (7/4). Thrombosed AVF - not salvageable, TDC in use. For new access in near future. Hypotension/volume: BP low - gets midodrine pre-HD. + SOB/orthopnea earlier this week - some improvement. Has dense thigh edema. Keep challenging, needs lower EDW on discharge.  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  Metabolic bone disease: CorrCa up slightly, continue Renvela as binder. VDRA on hold.   Nutrition: Alb low, continue supps. AFib - on amiodarone/Eliquis  T2DM - historically poorly controlled. Insulin per primary AMS/Twitching - felt secondary to Lyrica toxicity in ESRD -> discontinued. Dispo: In CIR.  Ozzie Hoyle, PA-C 11/27/2022, 9:00 AM  [facility]  Nephrology attending: I have personally seen and examined the patient.  Chart reviewed.  I agree with assessment plan as outlined  above.  Noted hypoglycemia this morning, may need to adjust insulin defer to primary team.  Plan for regular dialysis today.  She is undergoing inpatient rehab.  Eddie North,  [facility].

## 2022-11-27 NOTE — Progress Notes (Signed)
Hypoglycemic Event  CBG: 45  Treatment: 8 oz juice/soda  Symptoms: Sweaty, shaking  Follow-up CBG: [id number] CBG Result:83  Possible Reasons for Event: Unknown  Comments/MD notified:yes    Susan [name]

## 2022-11-27 NOTE — Progress Notes (Signed)
Physical Therapy Session Note  Patient Details  Name: Susan [name] MRN: [id number] Date of Birth: September 18, 1950  Today's Date: 11/27/2022 PT Individual Time: 0731-0828 PT Individual Time Calculation (min): 57 min   Short Term Goals: Week 1:  PT Short Term Goal 1 (Week 1): Pt will perform rolling in bed to either side with MinA. PT Short Term Goal 2 (Week 1): Pt will perform supine <> sit with overall ModA. PT Short Term Goal 3 (Week 1): Pt will perform sit<>stand with MaxA +2 and tolerate stance for >20sec. PT Short Term Goal 4 (Week 1): Pt will initiate slide board transfer training. PT Short Term Goal 5 (Week 1): Pt will complete FIST for outcome measure.  Skilled Therapeutic Interventions/Progress Updates:   Received pt semi-reclined in bed with RN present at bedside administering medication. Pt reported feeling "horrible" due to blood sugar dropping to 45 earlier this morning (up to 130 currently). Pt on 2.5L O2 with SPO2 >93%, but pt reporting feeling SOB - encouraged pursed lip breathing. Encouraged OOB mobility, however pt hesitant wanting to "take it easy" while recovering from blood sugar drop and agreeable to led level exercises only. Provided pt with scrub pants/top and donned pants in supine with max A to thread LEs through. Pt rolled L/R with mod A using bedrails and required max A to pull pants over hips. Pt declined sitting EOB to change shirts; therefore performed x 2 modified crunches pulling up on bedrails and therapist removed dirty shirt and donned clean scrub top - pt then reported scrub top was uncomfortable; therefore removed scrub top and donned clean gown using same technique. Pt scooted to [facility] with supervision using Trendelenburg bed position and pulling on headboard, then performed the following exercises with emphasis on UE/LE strength/ROM:   -L ankle DF with red TB 2x12 -L ankle circles x20 clockwise/counterclockwise -SLR 2x10 bilaterally -hip abduction 2x10  bilaterally -hip adduction pillow squeezes 2x10 with 5 second isometric hold -bicep curls with red TB 2x12 bilaterally  -horizontal ER with red TB 2x10 Concluded session with pt semi-reclined in bed with BLEs elevated, needs within reach, and bed alarm on.   Therapy Documentation Precautions:  Precautions Precautions: Fall Precaution Comments: L lateral lower leg open wound, R rotator cuff tears, Restrictions Weight Bearing Restrictions: Yes LLE Weight Bearing: Non weight bearing  Therapy/Group: Individual Therapy Marlana Salvage Zaunegger Blima Rich PT, DPT 11/27/2022, 7:10 AM

## 2022-11-28 DIAGNOSIS — S91002D Unspecified open wound, left ankle, subsequent encounter: Secondary | ICD-10-CM

## 2022-11-28 DIAGNOSIS — S81002D Unspecified open wound, left knee, subsequent encounter: Secondary | ICD-10-CM

## 2022-11-28 LAB — GLUCOSE, CAPILLARY
Glucose-Capillary: 164 mg/dL — ABNORMAL HIGH (ref 70–99)
Glucose-Capillary: 205 mg/dL — ABNORMAL HIGH (ref 70–99)
Glucose-Capillary: 221 mg/dL — ABNORMAL HIGH (ref 70–99)
Glucose-Capillary: 171 mg/dL — ABNORMAL HIGH (ref 70–99)
Glucose-Capillary: 200 mg/dL — ABNORMAL HIGH (ref 70–99)

## 2022-11-28 MED ORDER — POLYETHYLENE GLYCOL 3350 17 G PO PACK
17.0000 g | PACK | Freq: Every day | ORAL | Status: DC
  Administered 2022-11-29 – 2022-12-09 (×5): 17 g via ORAL
  Filled 2022-11-28 (×11): qty 1

## 2022-11-28 MED ORDER — MELATONIN 5 MG PO TABS
5.0000 mg | ORAL_TABLET | Freq: Every day | ORAL | Status: DC
  Administered 2022-11-28 – 2022-12-11 (×14): 5 mg via ORAL
  Filled 2022-11-28 (×14): qty 1

## 2022-11-28 NOTE — Progress Notes (Signed)
Physical Therapy Session Note  Patient Details  Name: Susan [name] MRN: [id number] Date of Birth: 1950-07-25  Today's Date: 11/28/2022 PT Individual Time: 1300-1400 PT Individual Time Calculation (min): 60 min   Short Term Goals: Week 1:  PT Short Term Goal 1 (Week 1): Pt will perform rolling in bed to either side with MinA. PT Short Term Goal 2 (Week 1): Pt will perform supine <> sit with overall ModA. PT Short Term Goal 3 (Week 1): Pt will perform sit<>stand with MaxA +2 and tolerate stance for >20sec. PT Short Term Goal 4 (Week 1): Pt will initiate slide board transfer training. PT Short Term Goal 5 (Week 1): Pt will complete FIST for outcome measure.  Skilled Therapeutic Interventions/Progress Updates: Pt presents supine in bed and requires increased encouragement to participate.  Pt skeptical 2/2 female therapist so increased time for rapport building.  Pt transfers sup to sit w/ mod A and use of siderail, elevated HOB and then scoot to EOB.  Pt agreeable to LE there ex including LAQ, abd/add 3 x10 w/ rest breaks required.  Pt stating SOB although O2 sats on 2 LPM only decreased to 99%.  Pt performing all seated activities w/o UE support, L foot supported although pt w/o sensation from L knee and distal.  Pt performed seated reaches forward, forward and down and then rotational tapping on bed to B sides w/ 1# weighted ball 3 x 10 w/ rest breaks between sets.  Pt transferred sit to supine w/ supervision and use of siderail.  Pt performed hip/knee flexion in supine w/ AAROM to LLE, c/o pain to posterior knee.  Pt able to scoot to [facility] using siderails.  Bed alarm on and all needs in reach.  Missed tim of 15' 2/2 fatigue.     Therapy Documentation Precautions:  Precautions Precautions: Fall Precaution Comments: L lateral lower leg open wound, R rotator cuff tears, Restrictions Weight Bearing Restrictions: Yes LLE Weight Bearing: Non weight bearing General: PT Amount of Missed Time (min): 15  Minutes PT Missed Treatment Reason: Patient fatigue;Patient unwilling to participate (pt unable to participate further.) Vital Signs: Therapy Vitals Temp: 98.3 F (36.8 C) Temp Source: Oral Pulse Rate: 71 Resp: (!) 21 BP: (!) 137/58 Patient Position (if appropriate): Lying Oxygen Therapy SpO2: 100 % O2 Device: Nasal Cannula Pain:8/10 LLE       Therapy/Group: Individual Therapy  Lucio Edward 11/28/2022, 2:01 PM

## 2022-11-28 NOTE — Progress Notes (Signed)
Occupational Therapy Session Note  Patient Details  Name: Susan [name] MRN: [id number] Date of Birth: 03-05-1951  Today's Date: 11/28/2022 OT Individual Time: 1045-1200 OT Individual Time Calculation (min): 75 min    Short Term Goals: Week 2:  OT Short Term Goal 1 (Week 2): Pt will don LB garments with Mod A. OT Short Term Goal 2 (Week 2): Pt will perform UB grooming/hygiene at sink-side with setup A. OT Short Term Goal 3 (Week 2): Pt will perform slide board transfer with +1 Mod A.  Skilled Therapeutic Interventions/Progress Updates:    Pt bed level upon OT arrival. Open to therapy but deferred OOB this session due to feeling nauseous. Nursing aware and diet ginger ale offered but declined. Pt set up EOB for oral care with excursions facilitating core and oblique strengthening for item placement. Pt then instructed in use of personal adapted hand held skin inspection mirror. 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 OT instructed in use during R and L LE skin inspection during LB sponge bathing. Pt able to remove and reapply R shrinker sleeve with mod A as pt reports she is still sensitive to manage that limb. OT assisted pt back to bed level and completed B UE beach ball cane ex 10 reps for scap, sh, elbow x 3 sets. Left pt with all needs, safety measures and call button in reach.   Therapy Documentation Precautions:  Precautions Precautions: Fall Precaution Comments: L lateral lower leg open wound, R rotator cuff tears, Restrictions Weight Bearing Restrictions: Yes LLE Weight Bearing: Non weight bearing  Therapy/Group: Individual Therapy  Vicenta Dunning 11/28/2022, 7:34 AM

## 2022-11-28 NOTE — Progress Notes (Signed)
[facility] Progress Note    Dialysis Orders: [facility] TTS  4hr, 400/800, EDW 91.5kg, 2K/2Ca, TDC, heparin 4000U bolus  - Mircera 75 mcg IV q 2 weeks  - last 6/8 - Calcitriol 0.5 three times per week    Assessment/Plan: LLE wound infection: With maggot infestation on admission. Blood Cx negative. S/p OR debridement/wound vac on 6/21. Wound Cx grew Proteus + Klebsiella + Enterococcus: Plan is for Cefazolin 2g q HD until 11/28/22, last given 7/4. ESRD: Usual TTS schedule. "Extra" HD 7/1 due to dyspnea/orthopnea, then back to usual schedule. Tolerated HD Thur with 4L net UF w/o cramping. Plan next HD tomorrow. Thrombosed AVF - not salvageable, TDC in use. For new access in near future, already has appt. Hypotension/volume: BP low - gets midodrine pre-HD. + SOB/orthopnea earlier this week - some improvement. Has dense thigh edema. Keep challenging, needs lower EDW on discharge.  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  Metabolic bone disease: CorrCa up slightly, continue Renvela as binder. VDRA on hold.   Nutrition: Alb low, continue supps. AFib - on amiodarone/Eliquis  T2DM - historically poorly controlled. Insulin per primary AMS/Twitching - felt secondary to Lyrica toxicity in ESRD -> discontinued. Dispo: In CIR.  Subjective:  Seen in room. Tolerated HD yest without any cramping.    Objective Vitals:   11/27/22 1853 11/27/22 1943 11/28/22 0317 11/28/22 0523  BP: (!) 119/48 (!) 133/52 (!) 119/48 (!) 141/61  Pulse: 68 72 66 69  Resp: 17 18 18    Temp: 98.6 F (37 C) 98.5 F (36.9 C) 97.9 F (36.6 C) 98.1 F (36.7 C)  TempSrc: Oral Oral Oral Oral  SpO2: 99% 100% 100%   Weight:      Height:       Physical Exam General: Chronically ill appearing woman, NAD. Nasal O2 in place Heart: RRR; no murmur Lungs: CTAB; no rales Abdomen: soft Extremities: Dense thigh/hip edema. R BKA, LLE bandaged Dialysis Access: [facility]  Additional  Objective Labs: Basic Metabolic Panel: Recent Labs  Lab 11/22/22 0920 11/24/22 1413 11/25/22 1330  NA 133* 134* 134*  K 4.1 3.9 4.0  CL 96* 96* 97*  CO2 27 26 26   GLUCOSE 273* 229* 187*  BUN 49* 68* 52*  CREATININE 4.34* 5.12* 4.30*  CALCIUM 9.2 9.0 8.9  PHOS 4.3 5.0* 4.4   Liver Function Tests: Recent Labs  Lab 11/22/22 0920 11/24/22 1413 11/25/22 1330  ALBUMIN 2.6* 2.7* 2.6*   CBC: Recent Labs  Lab 11/22/22 0920 11/24/22 1430 11/25/22 1330  WBC 6.7 5.6 8.0  HGB 8.2* 8.7* 8.6*  HCT 27.8* 29.6* 28.7*  MCV 81.8 80.0 79.5*  PLT 177 208 213   Medications:    (feeding supplement) PROSource Plus  30 mL Oral BID BM   amiodarone  200 mg Oral Daily   apixaban  5 mg Oral BID   ascorbic acid  500 mg Oral Daily   atorvastatin  80 mg Oral Daily   Chlorhexidine Gluconate Cloth  6 each Topical BID   darbepoetin (ARANESP) injection - DIALYSIS  150 mcg Subcutaneous Q Wed-1800   feeding supplement (NEPRO CARB STEADY)  237 mL Oral BID BM   folic acid  1 mg Oral Daily   insulin aspart  0-6 Units Subcutaneous TID WC   insulin glargine-yfgn  16 Units Subcutaneous QHS   insulin glargine-yfgn  38 Units Subcutaneous Daily   levothyroxine  75 mcg Oral QAC breakfast   LORazepam  0.5  mg Oral QHS   midodrine  10 mg Oral Q T,Th,Sa-HD   multivitamin  1 tablet Oral QHS   pantoprazole  40 mg Oral BID AC   polyethylene glycol  17 g Oral BID   ramelteon  8 mg Oral QHS   senna-docusate  2 tablet Oral BID   sevelamer carbonate  1,600 mg Oral TID WC   sorbitol  45 mL Oral Once   venlafaxine XR  75 mg Oral Q breakfast   zinc sulfate  220 mg Oral Daily

## 2022-11-28 NOTE — Progress Notes (Signed)
Occupational Therapy Session Note  Patient Details  Name: Susan [name] MRN: [id number] Date of Birth: 06/03/1950  Today's Date: 11/28/2022 OT Individual Time: 2952-8413 OT Individual Time Calculation (min): 71 min    Short Term Goals: Week 2:  OT Short Term Goal 1 (Week 2): Pt will don LB garments with Mod A. OT Short Term Goal 2 (Week 2): Pt will perform UB grooming/hygiene at sink-side with setup A. OT Short Term Goal 3 (Week 2): Pt will perform slide board transfer with +1 Mod A.  Skilled Therapeutic Interventions/Progress Updates:  Pt received resting in bed for skilled OT session with focus on BADL participation. Pt agreeable to interventions, demonstrating overall pleasant mood. Pt with un-rated pain in LLE, stating "I got a muscle relaxer for it because of spams. . . ". OT offering intermediate rest breaks and positioning suggestions throughout session to address pain/fatigue and maximize participation/safety in session.   Pt comes to EOB with HOB elevated, requiring Mod A for trunk elevation. Sitting EOB, pt found to be incontinent of BM, returning to bed for assistance with peri-care. Pt continuing to empty during care, bed pan placed, and patient provided with privacy to attempt continent void x2 successful trials. See flowsheets.   OOB activity deferred this session due to time constraints.  Direct handoff to RN for wound assessment and care.   Pt remained resting in bed with all immediate needs met at end of session. Pt continues to be appropriate for skilled OT intervention to promote further functional independence.   Therapy Documentation Precautions:  Precautions Precautions: Fall Precaution Comments: L lateral lower leg open wound, R rotator cuff tears, Restrictions Weight Bearing Restrictions: Yes LLE Weight Bearing: Non weight bearing   Therapy/Group: Individual Therapy  Lou Cal, OTR/L, MSOT  11/28/2022, 5:23 AM

## 2022-11-28 NOTE — Progress Notes (Signed)
PROGRESS NOTE   Subjective/Complaints: She reports poor sleep last night.  She did not use any oxycodone yesterday, continues to decline.  She has tried the muscle relaxer which is helping her pain.  LBM 7/5  ROS: Denies fever or chills, chest pain,  abd pain, nausea, vomiting, diarrhea,  new changes in motor or sensory function + constipation-improved +left leg pain and phantom pain  Objective:   No results found. Recent Labs    11/25/22 1330  WBC 8.0  HGB 8.6*  HCT 28.7*  PLT 213    Recent Labs    11/25/22 1330  NA 134*  K 4.0  CL 97*  CO2 26  GLUCOSE 187*  BUN 52*  CREATININE 4.30*  CALCIUM 8.9     Intake/Output Summary (Last 24 hours) at 11/28/2022 1610 Last data filed at 11/27/2022 2130 Gross per 24 hour  Intake 720 ml  Output 4000 ml  Net -3280 ml      Pressure Injury 05/23/22 Buttocks Right Deep Tissue Pressure Injury - Purple or maroon localized area of discolored intact skin or blood-filled blister due to damage of underlying soft tissue from pressure and/or shear. (Active)  05/23/22 1715  Location: Buttocks  Location Orientation: Right  Staging: Deep Tissue Pressure Injury - Purple or maroon localized area of discolored intact skin or blood-filled blister due to damage of underlying soft tissue from pressure and/or shear.  Wound Description (Comments):   Present on Admission: Yes     Pressure Injury 08/27/22 Coccyx Mid Stage 2 -  Partial thickness loss of dermis presenting as a shallow open injury with a red, pink wound bed without slough. 60mmx5mmx0 red center unattached edges (Active)  08/27/22 1000  Location: Coccyx  Location Orientation: Mid  Staging: Stage 2 -  Partial thickness loss of dermis presenting as a shallow open injury with a red, pink wound bed without slough.  Wound Description (Comments): 58mmx5mmx0 red center unattached edges  Present on Admission:     Physical Exam: Vital  Signs Blood pressure (!) 141/61, pulse 69, temperature 98.1 F (36.7 C), temperature source Oral, resp. rate 18, height 5\' 5"  (1.651 m), weight 91 kg, SpO2 100 %.  Physical Exam Constitutional: No apparent distress.  Sitting in chair. Chronically ill appearing HENT: [place], AT, MMM Eyes: PERRLA. EOMI. Visual fields grossly intact.  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  Non-labored  On room air this AM. Abdomen: Soft, nontender, nondistended, +bowel sounds Skin:  + Right BKA dry dressing in place + Left calf wound - please see image, appears similar to prior eval  Neurologic exam: Alert and awake, decreased sensation below distal thighs, cranial nerves II through XII grossly intact Right IJ TDC in place MSK: No abnormal tone noted      + Active range of motion in right shoulder limited in < 90 degrees flexion, abduction due to prior unrepaired shoulder dislocation      Strength:               Moving all 4 extremities      Assessment/Plan: 1. Functional deficits which require 3+ hours per day of interdisciplinary therapy  in a comprehensive inpatient rehab setting. Physiatrist is providing close team supervision and 24 hour management of active medical problems listed below. Physiatrist and rehab team continue to assess barriers to discharge/monitor patient progress toward functional and medical goals  Care Tool:  Bathing    Body parts bathed by patient: Right arm, Left arm, Chest, Face   Body parts bathed by helper: Buttocks, Front perineal area, Abdomen, Right upper leg, Left upper leg Body parts n/a: Right lower leg, Left lower leg   Bathing assist Assist Level: Dependent - Patient 0%     Upper Body Dressing/Undressing Upper body dressing   What is the patient wearing?: Hospital gown only    Upper body assist Assist Level: Maximal Assistance - Patient 25 - 49%    Lower Body Dressing/Undressing Lower body dressing       What is the patient wearing?: Incontinence brief, Hospital gown only, Ace wrap/stump shrinker     Lower body assist Assist for lower body dressing: Dependent - Patient 0%     Toileting Toileting    Toileting assist Assist for toileting: Dependent - Patient 0%     Transfers Chair/bed transfer  Transfers assist  Chair/bed transfer activity did not occur: Safety/medical concerns  Chair/bed transfer assist level: Dependent - mechanical lift     Locomotion Ambulation   Ambulation assist   Ambulation activity did not occur: Safety/medical concerns          Walk 10 feet activity   Assist  Walk 10 feet activity did not occur: Safety/medical concerns        Walk 50 feet activity   Assist Walk 50 feet with 2 turns activity did not occur: Safety/medical concerns         Walk 150 feet activity   Assist Walk 150 feet activity did not occur: Safety/medical concerns         Walk 10 feet on uneven surface  activity   Assist Walk 10 feet on uneven surfaces activity did not occur: Safety/medical concerns         Wheelchair     Assist Is the patient using a wheelchair?: Yes Type of Wheelchair: Manual    Wheelchair assist level: Dependent - Patient 0% Max wheelchair distance: 10 ft    Wheelchair 50 feet with 2 turns activity    Assist        Assist Level: Dependent - Patient 0%   Wheelchair 150 feet activity     Assist      Assist Level: Dependent - Patient 0%   Blood pressure (!) 141/61, pulse 69, temperature 98.1 F (36.7 C), temperature source Oral, resp. rate 18, height 5\' 5"  (1.651 m), weight 91 kg, SpO2 100 %.  Medical Problem List and Plan: 1. Functional deficits secondary to right BKA revision 1/24 and left leg wound             -patient may shower with wound covered             -ELOS/Goals: 10-14 days, min to mod assist PT/OT -Was preparing for initial prosthetic fitting with [employer] prior to admission, they are  aware she is coming to rehab, would coordinate trial of prosthetic device while at inpatient rehab   -7/19 expected DC date 2.  Antithrombotics: -DVT/anticoagulation:  Pharmaceutical: Eliquis 5mg  BID             -antiplatelet therapy: N/A 3. Pain Management:  Tylenol, Robaxin, and Oxycodone prn.  -Patient had altered mental status and twitching suspected  due to Lyrica toxicity prior to admission to CIR.  Lyrica was discontinued. -7/2 patient has been avoiding trying oxycodone due to constipation, discussed that we can give additional laxatives with this if needed.  We could try low-dose of Lyrica for phantom pain.  She is currently on Effexor. -7/3 will adjust oxycodone to 2.5 to 5 mg PRN -7/4 doing better with 2.5 oxycodone and robaxin PRN, she reports minimal benefit to phantom pain with gabapentin/lyrica in the past -7/5 continue oxycodone 2.5 mg as needed and Robaxin as needed, may be beneficial to try before therapy if needed 4. Mood/Behavior/Sleep: LCSW to follow for evaluation and support.  --chronic insomnia--has Ambien prn w/ Ramelteron 8mg  QHS, and Lorazepam. -11/22/22 ambien not on orders; Lorazepam also not on orders, but will reorder 0.5mg  QHS PRN, also melatonin 5mg  QHS PRN, will hold off on ambien for now.  -11/23/22 poor sleep again, will schedule lorazepam for at bedtime, leave melatonin PRN to try, cont holding off on ambien for now but may want to consider restarting if sleep continues to be an issue.  7/5 will schedule melatonin for insomnia, does not appear she has tried this yet while it was as needed             -antipsychotic agents: N/A 5. Neuropsych/cognition: This patient is capable of making decisions on her own behalf. -11/23/22 pt requesting spiritual care/chaplain, ordered for this; also may benefit from neuropsych consult this week-- defer to weekday team -7/1 Seen by chaplain today 6. Skin/Wound Care: Monitor wound for healing.  --Wound VAC d/c due to concerns of  wound getting macerated per Dr. Lajoyce Corners.  --Dry dressing changes and to add silvadene to wound bed if wound starts getting dry.  -6/28 VAC was removed last night Dr. Lajoyce Corners advises to "Apply small amount of silvadine pulse gauze and ace wrap. Change daily " -11/22/22 yellowish slough/drainage from wound bed, contacted Dr. Lajoyce Corners regarding this, will see if he wants BID dressing changes-- still pending response. Will f/up tomorrow. For now, doesn't look like worsening infection, can monitor closely. Will undress wound and re-photograph tomorrow -11/23/22 wound looks about the same or maybe a little better today, Dr. Lajoyce Corners apparently out of town per patient, will change to BID silvadene and dressing changes for now, to have closer eyes on wound, but may want to touch base with Dr. Lajoyce Corners this week to check if he would like to adjust anything.  -7/3 PA with ortho evaluated her wound today, recommended just dry dressings for now, appreciate assistance -7/5 will continue current wound care orders per orthopedics, patient was seen by Barnie Del NP, Will alert Dr. Lajoyce Corners new image of wound is current as of today  7. Fluids/Electrolytes/Nutrition:  Strict I/O with 1200 cc FR. Monitor intake. Continue supplements/vitamins.  8. Kleb/Proteus/Enterococcus wound infection: Cefazolin for 2 weeks with EOT 11/29/22 per Dr. Thedore Mins 9.  T2DM: Hgb A1c-7.9 08/27/22 -Monitor BS ac/hs and use SSI for elevated BS. Titrate basal insulin as indicated.  -6/28 she is on Semglee 40 units in the morning and 20 units at bedtime.  Increase bedtime Semglee to 22 units --7/4 decrease Semglee to 38 units in AM, 16 in afternoon due to hypoglycemia event -7/5 Cbgs controlled, continue to monitor   CBG (last 3)  Recent Labs    11/27/22 2127 11/28/22 0255 11/28/22 0605  GLUCAP 201* 164* 205*     10. CAD/biventricular CHF: On GDMT limited due to hypotension.   --Continue Lipitor 80mg  QD   11.  ESRD: HD TTS at the end of the day to help with  tolerance of therapy.  Thrombosed AVF not salvageable.                --has been on regular diet-->? Low salt restrictions.   -6/28 nephrology following appreciate assistance, next HD tomorrow  -7/1 HD plans HD today due to orthopnea + SOB  7/2 patient has scheduled HD session again today, hopefully will have benefit to orthopnea although this may be anxiety related also  7/3 SOB a little improved today- she reports it is at baseline 12. Depression with anxiety d/o, panic attacks: Now on Venlafaxine XR 75mg  QD with Lorazepam at night.--lorazepam not ordered, will reorder 0.5mg  QHS PRN, changed to scheduled starting 11/23/22 as mentioned above.   13. PAF: On Eliquis and amiodarone 200mg  QD for rate control.   -7/1-5 HR stable in 60s, continue current     11/28/2022    5:23 AM 11/28/2022    3:17 AM 11/27/2022    7:43 PM  Vitals with BMI  Systolic 141 119 161  Diastolic 61 48 52  Pulse 69 66 72    14. Anemia of chronic disease:  -11/22/22 Hgb stable at 8.2 today, monitor labs  7/2 nephrology ordering Aranesp 15.   H/o GIB due to stercoral ulcer: Avoid constipation. Continue Protonix 40mg  BID, miralax QD , Senokot 2 tabs BID, and PRNs.   -11/23/22 last BM yesterday, monitor  -7/1 Small BM yesterday, increase miralax to BID for constipation   -7/2 last BM yesterday although small, continue sorbitol as needed  -7/3 sorbitol 45ml ordered  -7/5 LBM today, brown, changed miralax from BID to daily 16. Morbid Obesity. Dietary education  -Body mass index is 33.38 kg/m. 17.  Hypotension  -Taking midodrine 10mg  prior to dialysis  -6/29-30/24 BPs stable, monitor  -7/2 consider more regular treatment of midodrine if orthostatic symptoms become limiting  -7/4-5 stable today, continue to monitor Vitals:   11/27/22 1500 11/27/22 1530 11/27/22 1600 11/27/22 1630  BP: 133/60 (!) 133/59 (!) 135/43 (!) 115/95   11/27/22 1700 11/27/22 1730 11/27/22 1812 11/27/22 1817  BP: (!) 114/101 (!) 128/47 134/68 (!)  142/65   11/27/22 1853 11/27/22 1943 11/28/22 0317 11/28/22 0523  BP: (!) 119/48 (!) 133/52 (!) 119/48 (!) 141/61    18. Hypothyroidism: continue synthroid qAM 19. Oxygen dependence/chronic respiratory failure: pt states she uses PRN O2 at home, mostly related to anxiety; ordered O2 2L via [place] to maintain SpO2 >90%. Pt with clear lung sounds, doubt need for emergent work up today. Monitor  -Okay to trial increasing time off nasal cannula O2, provide reassurance  -Tolerating Off O2 this AM    LOS: 8 days A FACE TO FACE EVALUATION WAS PERFORMED  Fanny Dance 11/28/2022, 8:28 AM

## 2022-11-29 LAB — CBC WITH DIFFERENTIAL/PLATELET
Abs Immature Granulocytes: 0.01 10*3/uL (ref 0.00–0.07)
Basophils Absolute: 0.1 10*3/uL (ref 0.0–0.1)
Basophils Relative: 1 %
Eosinophils Absolute: 0.3 10*3/uL (ref 0.0–0.5)
Eosinophils Relative: 6 %
HCT: 31.4 % — ABNORMAL LOW (ref 36.0–46.0)
Hemoglobin: 9.6 g/dL — ABNORMAL LOW (ref 12.0–15.0)
Immature Granulocytes: 0 %
Lymphocytes Relative: 13 %
Lymphs Abs: 0.7 10*3/uL (ref 0.7–4.0)
MCH: 23.9 pg — ABNORMAL LOW (ref 26.0–34.0)
MCHC: 30.6 g/dL (ref 30.0–36.0)
MCV: 78.1 fL — ABNORMAL LOW (ref 80.0–100.0)
Monocytes Absolute: 0.4 10*3/uL (ref 0.1–1.0)
Monocytes Relative: 8 %
Neutro Abs: 3.8 10*3/uL (ref 1.7–7.7)
Neutrophils Relative %: 72 %
Platelets: 255 10*3/uL (ref 150–400)
RBC: 4.02 MIL/uL (ref 3.87–5.11)
RDW: 17.2 % — ABNORMAL HIGH (ref 11.5–15.5)
WBC: 5.3 10*3/uL (ref 4.0–10.5)
nRBC: 0 % (ref 0.0–0.2)

## 2022-11-29 LAB — BASIC METABOLIC PANEL
Anion gap: 11 (ref 5–15)
BUN: 54 mg/dL — ABNORMAL HIGH (ref 8–23)
CO2: 25 mmol/L (ref 22–32)
Calcium: 9 mg/dL (ref 8.9–10.3)
Chloride: 96 mmol/L — ABNORMAL LOW (ref 98–111)
Creatinine, Ser: 4.43 mg/dL — ABNORMAL HIGH (ref 0.44–1.00)
GFR, Estimated: 10 mL/min — ABNORMAL LOW (ref >60)
Glucose, Bld: 196 mg/dL — ABNORMAL HIGH (ref 70–99)
Potassium: 3.9 mmol/L (ref 3.5–5.1)
Sodium: 132 mmol/L — ABNORMAL LOW (ref 135–145)

## 2022-11-29 LAB — GLUCOSE, CAPILLARY
Glucose-Capillary: 109 mg/dL — ABNORMAL HIGH (ref 70–99)
Glucose-Capillary: 59 mg/dL — ABNORMAL LOW (ref 70–99)
Glucose-Capillary: 181 mg/dL — ABNORMAL HIGH (ref 70–99)
Glucose-Capillary: 155 mg/dL — ABNORMAL HIGH (ref 70–99)

## 2022-11-29 MED ORDER — INSULIN GLARGINE-YFGN 100 UNIT/ML ~~LOC~~ SOLN
14.0000 [IU] | Freq: Every day | SUBCUTANEOUS | Status: DC
  Administered 2022-11-29 – 2022-12-01 (×3): 14 [IU] via SUBCUTANEOUS
  Filled 2022-11-29 (×4): qty 0.14

## 2022-11-29 MED ORDER — INSULIN GLARGINE-YFGN 100 UNIT/ML ~~LOC~~ SOLN
36.0000 [IU] | Freq: Every day | SUBCUTANEOUS | Status: DC
  Administered 2022-11-30 – 2022-12-02 (×3): 36 [IU] via SUBCUTANEOUS
  Filled 2022-11-29 (×3): qty 0.36

## 2022-11-29 MED ORDER — HEPARIN SODIUM (PORCINE) 1000 UNIT/ML IJ SOLN
INTRAMUSCULAR | Status: AC
  Filled 2022-11-29: qty 4

## 2022-11-29 NOTE — Progress Notes (Signed)
[facility] Progress Note    Dialysis Orders: [facility] TTS  4hr, 400/800, EDW 91.5kg, 2K/2Ca, TDC, heparin 4000U bolus  - Mircera 75 mcg IV q 2 weeks  - last 6/8 - Calcitriol 0.5 three times per week    Assessment/Plan: LLE wound infection: With maggot infestation on admission. Blood Cx negative. S/p OR debridement/wound vac on 6/21. Wound Cx grew Proteus + Klebsiella + Enterococcus: Plan is for Cefazolin 2g q HD until 11/28/22, last given 7/4. ESRD: Usual TTS schedule. "Extra" HD 7/1 due to dyspnea/orthopnea, then back to usual schedule. Tolerated HD Thur with 4L net UF w/o cramping. PD today  Thrombosed AVF - not salvageable, TDC in use. For new access in near future, already has appt. Hypotension/volume: BP low - gets midodrine pre-HD. + SOB/orthopnea earlier this week - some improvement. Has dense thigh edema. Keep challenging, needs lower EDW on discharge.  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  Metabolic bone disease: CorrCa up slightly, continue Renvela as binder. VDRA on hold.   Nutrition: Alb low, continue supps. AFib - on amiodarone/Eliquis  T2DM - historically poorly controlled. Insulin per primary AMS/Twitching - felt secondary to Lyrica toxicity in ESRD -> discontinued. Dispo: In CIR.  Subjective:  Seen in room. Looks tired, feeling down today. No particular complaints. Dialysis today.    Objective Vitals:   11/28/22 0523 11/28/22 1358 11/28/22 1935 11/29/22 0519  BP: (!) 141/61 (!) 137/58 (!) 127/59 (!) 127/54  Pulse: 69 71 73 67  Resp:  (!) 21 18 18   Temp: 98.1 F (36.7 C) 98.3 F (36.8 C) 98.2 F (36.8 C) 98.5 F (36.9 C)  TempSrc: Oral Oral Oral Oral  SpO2:  100% 100% 97%  Weight:      Height:       Physical Exam General: Chronically ill appearing woman, NAD. Nasal O2 in place Heart: RRR; no murmur Lungs: CTAB; no rales Abdomen: soft Extremities: Dense thigh/hip edema. R BKA, LLE bandaged Dialysis  Access: [facility]  Additional Objective Labs: Basic Metabolic Panel: Recent Labs  Lab 11/24/22 1413 11/25/22 1330 11/29/22 1101  NA 134* 134* 132*  K 3.9 4.0 3.9  CL 96* 97* 96*  CO2 26 26 25   GLUCOSE 229* 187* 196*  BUN 68* 52* 54*  CREATININE 5.12* 4.30* 4.43*  CALCIUM 9.0 8.9 9.0  PHOS 5.0* 4.4  --     Liver Function Tests: Recent Labs  Lab 11/24/22 1413 11/25/22 1330  ALBUMIN 2.7* 2.6*    CBC: Recent Labs  Lab 11/24/22 1430 11/25/22 1330 11/29/22 1101  WBC 5.6 8.0 5.3  NEUTROABS  --   --  3.8  HGB 8.7* 8.6* 9.6*  HCT 29.6* 28.7* 31.4*  MCV 80.0 79.5* 78.1*  PLT 208 213 255    Medications:    (feeding supplement) PROSource Plus  30 mL Oral BID BM   amiodarone  200 mg Oral Daily   apixaban  5 mg Oral BID   ascorbic acid  500 mg Oral Daily   atorvastatin  80 mg Oral Daily   Chlorhexidine Gluconate Cloth  6 each Topical BID   darbepoetin (ARANESP) injection - DIALYSIS  150 mcg Subcutaneous Q Wed-1800   feeding supplement (NEPRO CARB STEADY)  237 mL Oral BID BM   folic acid  1 mg Oral Daily   insulin aspart  0-6 Units Subcutaneous TID WC   insulin glargine-yfgn  14 Units Subcutaneous QHS   [START ON 11/30/2022] insulin glargine-yfgn  36 Units Subcutaneous Daily   levothyroxine  75 mcg Oral QAC breakfast   LORazepam  0.5 mg Oral QHS   melatonin  5 mg Oral QHS   midodrine  10 mg Oral Q T,Th,Sa-HD   multivitamin  1 tablet Oral QHS   pantoprazole  40 mg Oral BID AC   polyethylene glycol  17 g Oral Daily   ramelteon  8 mg Oral QHS   senna-docusate  2 tablet Oral BID   sevelamer carbonate  1,600 mg Oral TID WC   sorbitol  45 mL Oral Once   venlafaxine XR  75 mg Oral Q breakfast   zinc sulfate  220 mg Oral Daily    Tomasa Blase PA-C [facility] 11/29/2022,12:15 PM

## 2022-11-29 NOTE — Progress Notes (Signed)
Hypoglycemic Event  CBG: 59  Treatment: 8 oz juice/soda  Symptoms: Sweaty  Follow-up CBG: Time:656 CBG Result:109  Possible Reasons for Event: Unknown  Comments/MD notified:Dr Kirsten notified.     Susan [name]

## 2022-11-29 NOTE — Progress Notes (Signed)
PROGRESS NOTE   Subjective/Complaints:  Doing alright today, slept ok overnight. Pain fairly well controlled when she takes the meds. LBM yesterday. Urinates a little bit, normal for her. Denies any other complaints or concerns. Hypoglycemic to 59 this morning, per nursing.   ROS: Denies fever or chills, chest pain,  abd pain, nausea, vomiting, diarrhea,  new changes in motor or sensory function + constipation-improved +left leg pain and phantom pain  Objective:   No results found. No results for input(s): "WBC", "HGB", "HCT", "PLT" in the last 72 hours.  No results for input(s): "NA", "K", "CL", "CO2", "GLUCOSE", "BUN", "CREATININE", "CALCIUM" in the last 72 hours.   Intake/Output Summary (Last 24 hours) at 11/29/2022 1039 Last data filed at 11/29/2022 0814 Gross per 24 hour  Intake 592 ml  Output --  Net 592 ml     Pressure Injury 05/23/22 Buttocks Right Deep Tissue Pressure Injury - Purple or maroon localized area of discolored intact skin or blood-filled blister due to damage of underlying soft tissue from pressure and/or shear. (Active)  05/23/22 1715  Location: Buttocks  Location Orientation: Right  Staging: Deep Tissue Pressure Injury - Purple or maroon localized area of discolored intact skin or blood-filled blister due to damage of underlying soft tissue from pressure and/or shear.  Wound Description (Comments):   Present on Admission: Yes     Pressure Injury 08/27/22 Coccyx Mid Stage 2 -  Partial thickness loss of dermis presenting as a shallow open injury with a red, pink wound bed without slough. 43mmx5mmx0 red center unattached edges (Active)  08/27/22 1000  Location: Coccyx  Location Orientation: Mid  Staging: Stage 2 -  Partial thickness loss of dermis presenting as a shallow open injury with a red, pink wound bed without slough.  Wound Description (Comments): 46mmx5mmx0 red center unattached edges  Present on  Admission:     Physical Exam: Vital Signs Blood pressure (!) 127/54, pulse 67, temperature 98.5 F (36.9 C), temperature source Oral, resp. rate 18, height 5\' 5"  (1.651 m), weight 91 kg, SpO2 97 %.  Physical Exam Constitutional: No apparent distress.  Laying in bed. Chronically ill appearing HENT: [place], AT, MMM Eyes: PERRLA. EOMI. Visual fields grossly intact.  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  Non-labored  On 2L via [place]. Abdomen: Soft, nontender, nondistended, +bowel sounds Skin:  + Right BKA dry dressing in place + Left calf wound dressed, no drainage through dressings  PRIOR EXAMS:  Neurologic exam: Alert and awake, decreased sensation below distal thighs, cranial nerves II through XII grossly intact Right IJ TDC in place MSK: No abnormal tone noted      + Active range of motion in right shoulder limited in < 90 degrees flexion, abduction due to prior unrepaired shoulder dislocation      Strength:               Moving all 4 extremities      Assessment/Plan: 1. Functional deficits which require 3+ hours per day of interdisciplinary therapy in a comprehensive inpatient rehab setting. Physiatrist is providing close team supervision and 24 hour management of active medical problems listed  below. Physiatrist and rehab team continue to assess barriers to discharge/monitor patient progress toward functional and medical goals  Care Tool:  Bathing    Body parts bathed by patient: Right arm, Left arm, Chest, Face   Body parts bathed by helper: Buttocks, Front perineal area, Abdomen, Right upper leg, Left upper leg Body parts n/a: Right lower leg, Left lower leg   Bathing assist Assist Level: Dependent - Patient 0%     Upper Body Dressing/Undressing Upper body dressing   What is the patient wearing?: Hospital gown only    Upper body assist Assist Level: Maximal Assistance - Patient 25 - 49%    Lower Body  Dressing/Undressing Lower body dressing      What is the patient wearing?: Incontinence brief, Hospital gown only, Ace wrap/stump shrinker     Lower body assist Assist for lower body dressing: Dependent - Patient 0%     Toileting Toileting    Toileting assist Assist for toileting: Total Assistance - Patient < 25%     Transfers Chair/bed transfer  Transfers assist  Chair/bed transfer activity did not occur: Safety/medical concerns  Chair/bed transfer assist level: Dependent - mechanical lift     Locomotion Ambulation   Ambulation assist   Ambulation activity did not occur: Safety/medical concerns          Walk 10 feet activity   Assist  Walk 10 feet activity did not occur: Safety/medical concerns        Walk 50 feet activity   Assist Walk 50 feet with 2 turns activity did not occur: Safety/medical concerns         Walk 150 feet activity   Assist Walk 150 feet activity did not occur: Safety/medical concerns         Walk 10 feet on uneven surface  activity   Assist Walk 10 feet on uneven surfaces activity did not occur: Safety/medical concerns         Wheelchair     Assist Is the patient using a wheelchair?: Yes Type of Wheelchair: Manual    Wheelchair assist level: Dependent - Patient 0% Max wheelchair distance: 10 ft    Wheelchair 50 feet with 2 turns activity    Assist        Assist Level: Dependent - Patient 0%   Wheelchair 150 feet activity     Assist      Assist Level: Dependent - Patient 0%   Blood pressure (!) 127/54, pulse 67, temperature 98.5 F (36.9 C), temperature source Oral, resp. rate 18, height 5\' 5"  (1.651 m), weight 91 kg, SpO2 97 %.  Medical Problem List and Plan: 1. Functional deficits secondary to right BKA revision 1/24 and left leg wound             -patient may shower with wound covered             -ELOS/Goals: 10-14 days, min to mod assist PT/OT -Was preparing for initial  prosthetic fitting with [facility] prior to admission, they are aware she is coming to rehab, would coordinate trial of prosthetic device while at inpatient rehab   -7/19 expected DC date 2.  Antithrombotics: -DVT/anticoagulation:  Pharmaceutical: Eliquis 5mg  BID             -antiplatelet therapy: N/A 3. Pain Management:  Tylenol, Robaxin, and Oxycodone prn.  -Patient had altered mental status and twitching suspected due to Lyrica toxicity prior to admission to CIR.  Lyrica was discontinued. -7/2 patient has been avoiding trying  oxycodone due to constipation, discussed that we can give additional laxatives with this if needed.  We could try low-dose of Lyrica for phantom pain.  She is currently on Effexor. -7/3 will adjust oxycodone to 2.5 to 5 mg PRN -7/4 doing better with 2.5 oxycodone and robaxin PRN, she reports minimal benefit to phantom pain with gabapentin/lyrica in the past -7/5 continue oxycodone 2.5 mg as needed and Robaxin as needed, may be beneficial to try before therapy if needed 4. Mood/Behavior/Sleep: LCSW to follow for evaluation and support.  --chronic insomnia--has Ambien prn w/ Ramelteron 8mg  QHS, and Lorazepam. -11/22/22 ambien not on orders; Lorazepam also not on orders, but will reorder 0.5mg  QHS PRN, also melatonin 5mg  QHS PRN, will hold off on ambien for now.  -11/23/22 poor sleep again, will schedule lorazepam for at bedtime, leave melatonin PRN to try, cont holding off on ambien for now but may want to consider restarting if sleep continues to be an issue.  -7/5 will schedule melatonin for insomnia, does not appear she has tried this yet while it was as needed -11/29/22 better sleep last night, monitor             -antipsychotic agents: N/A 5. Neuropsych/cognition: This patient is capable of making decisions on her own behalf. -11/23/22 pt requesting spiritual care/chaplain, ordered for this; also may benefit from neuropsych consult this week-- defer to weekday team -7/1 Seen by  chaplain today 6. Skin/Wound Care: Monitor wound for healing.  --Wound VAC d/c due to concerns of wound getting macerated per Dr. Lajoyce Corners.  --Dry dressing changes and to add silvadene to wound bed if wound starts getting dry.  -6/28 VAC was removed last night Dr. Lajoyce Corners advises to "Apply small amount of silvadine pulse gauze and ace wrap. Change daily " -11/22/22 yellowish slough/drainage from wound bed, contacted Dr. Lajoyce Corners regarding this, will see if he wants BID dressing changes-- still pending response. Will f/up tomorrow. For now, doesn't look like worsening infection, can monitor closely. Will undress wound and re-photograph tomorrow -11/23/22 wound looks about the same or maybe a little better today, Dr. Lajoyce Corners apparently out of town per patient, will change to BID silvadene and dressing changes for now, to have closer eyes on wound, but may want to touch base with Dr. Lajoyce Corners this week to check if he would like to adjust anything.  -7/3 PA with ortho evaluated her wound today, recommended just dry dressings for now, appreciate assistance -7/5 will continue current wound care orders per orthopedics, patient was seen by Barnie Del NP, Will alert Dr. Lajoyce Corners new image of wound is current as of today  7. Fluids/Electrolytes/Nutrition:  Strict I/O with 1200 cc FR. Monitor intake. Continue supplements/vitamins.  8. Kleb/Proteus/Enterococcus wound infection: Cefazolin for 2 weeks with EOT 11/29/22 per Dr. Thedore Mins 9.  T2DM: Hgb A1c-7.9 08/27/22 -Monitor BS ac/hs and use SSI for elevated BS. Titrate basal insulin as indicated.  -6/28 she is on Semglee 40 units in the morning and 20 units at bedtime.  Increase bedtime Semglee to 22 units --7/4 decrease Semglee to 38 units in AM, 16 in afternoon due to hypoglycemia event -7/5 Cbgs controlled, continue to monitor  -11/29/22 another hypoglycemic event this morning, mostly <200 during day, will further decrease Semglee to 36U qAM + 14U qPM to see if we have fewer morning  lows-- monitor  CBG (last 3)  Recent Labs    11/28/22 2130 11/29/22 0633 11/29/22 0656  GLUCAP 200* 59* 109*    10. CAD/biventricular  CHF: On GDMT limited due to hypotension.   --Continue Lipitor 80mg  QD   11. ESRD: HD TTS at the end of the day to help with tolerance of therapy.  Thrombosed AVF not salvageable.                --has been on regular diet-->? Low salt restrictions.   -6/28 nephrology following appreciate assistance, next HD tomorrow  -7/1 HD plans HD today due to orthopnea + SOB -7/2 patient has scheduled HD session again today, hopefully will have benefit to orthopnea although this may be anxiety related also  -7/3 SOB a little improved today- she reports it is at baseline 12. Depression with anxiety d/o, panic attacks: Now on Venlafaxine XR 75mg  QD with Lorazepam at night.--lorazepam not ordered, will reorder 0.5mg  QHS PRN, changed to scheduled starting 11/23/22 as mentioned above.   13. PAF: On Eliquis and amiodarone 200mg  QD for rate control.   -7/1-6 HR stable in 50-60s, continue current     11/29/2022    5:19 AM 11/28/2022    7:35 PM 11/28/2022    1:58 PM  Vitals with BMI  Systolic 127 127 161  Diastolic 54 59 58  Pulse 67 73 71    14. Anemia of chronic disease:  -11/22/22 Hgb stable at 8.2 today, monitor labs  -7/2 nephrology ordering Aranesp 15.   H/o GIB due to stercoral ulcer: Avoid constipation. Continue Protonix 40mg  BID, miralax QD , Senokot 2 tabs BID, and PRNs.   -11/23/22 last BM yesterday, monitor  -7/1 Small BM yesterday, increase miralax to BID for constipation   -7/2 last BM yesterday although small, continue sorbitol as needed  -7/3 sorbitol 45ml ordered  -7/5 LBM today, brown, changed miralax from BID to daily 16. Morbid Obesity. Dietary education  -Body mass index is 33.38 kg/m. 17.  Hypotension  -Taking midodrine 10mg  prior to dialysis  -6/29-30/24 BPs stable, monitor -7/2 consider more regular treatment of midodrine if orthostatic  symptoms become limiting  -7/4-6 stable today, continue to monitor Vitals:   11/27/22 1630 11/27/22 1700 11/27/22 1730 11/27/22 1812  BP: (!) 115/95 (!) 114/101 (!) 128/47 134/68   11/27/22 1817 11/27/22 1853 11/27/22 1943 11/28/22 0317  BP: (!) 142/65 (!) 119/48 (!) 133/52 (!) 119/48   11/28/22 0523 11/28/22 1358 11/28/22 1935 11/29/22 0519  BP: (!) 141/61 (!) 137/58 (!) 127/59 (!) 127/54    18. Hypothyroidism: continue synthroid qAM 19. Oxygen dependence/chronic respiratory failure: pt states she uses PRN O2 at home, mostly related to anxiety; ordered O2 2L via [place] to maintain SpO2 >90%. Pt with clear lung sounds, doubt need for emergent work up today. Monitor  -Okay to trial increasing time off nasal cannula O2, provide reassurance  -Tolerating Off O2 this AM    LOS: 9 days A FACE TO FACE EVALUATION WAS PERFORMED  [address] 11/29/2022, 10:39 AM

## 2022-11-29 NOTE — Progress Notes (Signed)
Occupational Therapy Session Note  Patient Details  Name: Susan [name] MRN: [id number] Date of Birth: 1950-12-16  {CHL IP REHAB OT TIME CALCULATIONS:[id number]}   Short Term Goals: Week 2:  OT Short Term Goal 1 (Week 2): Pt will don LB garments with Mod A. OT Short Term Goal 2 (Week 2): Pt will perform UB grooming/hygiene at sink-side with setup A. OT Short Term Goal 3 (Week 2): Pt will perform slide board transfer with +1 Mod A.  Skilled Therapeutic Interventions/Progress Updates:  Pt received *** for skilled OT session with focus on ***. Pt agreeable to interventions, demonstrating overall *** mood. Pt reported ***/10 pain, stating "***" in reference to ***. OT offering intermediate rest breaks and positioning suggestions throughout session to address pain/fatigue and maximize participation/safety in session.    Pt remained *** with all immediate needs met at end of session. Pt continues to be appropriate for skilled OT intervention to promote further functional independence.   Therapy Documentation Precautions:  Precautions Precautions: Fall Precaution Comments: L lateral lower leg open wound, R rotator cuff tears, Restrictions Weight Bearing Restrictions: Yes LLE Weight Bearing: Non weight bearing   Therapy/Group: Individual Therapy  Lou Cal, OTR/L, MSOT  11/29/2022, 2:50 PM

## 2022-11-30 DIAGNOSIS — R739 Hyperglycemia, unspecified: Secondary | ICD-10-CM

## 2022-11-30 LAB — GLUCOSE, CAPILLARY
Glucose-Capillary: 395 mg/dL — ABNORMAL HIGH (ref 70–99)
Glucose-Capillary: 345 mg/dL — ABNORMAL HIGH (ref 70–99)
Glucose-Capillary: 323 mg/dL — ABNORMAL HIGH (ref 70–99)
Glucose-Capillary: 277 mg/dL — ABNORMAL HIGH (ref 70–99)

## 2022-11-30 NOTE — Progress Notes (Signed)
Physical Therapy Weekly Progress Note  Patient Details  Name: Susan [name] MRN: [id number] Date of Birth: 02/04/1951  Beginning of progress report period: November 21, 2022 End of progress report period: November 30, 2022  Today's Date: 11/30/2022 PT Individual Time: 0800-0900 PT Individual Time Calculation (min): 60 min   Patient has met 4 of 5 short term goals.  Pt is making great progress towards her goals. Pt is performing rolling R and L with supervisoin with use of bed rails and supine to sit with min A, level slideboard transfer to R with supervision/min A and to L with min-mod A with increased time, verbal cues provided for technique, pt attempting standing from elevated surface with steady and +2 max A but unable 2/2 weakness and pain. Pt very anxious about trailing L LE prosthetic.   Patient continues to demonstrate the following deficits muscle weakness, decreased cardiorespiratoy endurance, and decreased sitting balance, decreased standing balance, decreased postural control, and decreased balance strategies and therefore will continue to benefit from skilled PT intervention to increase functional independence with mobility.  Patient progressing toward long term goals..  Continue plan of care.  PT Short Term Goals Week 1:  PT Short Term Goal 1 (Week 1): Pt will perform rolling in bed to either side with MinA. PT Short Term Goal 1 - Progress (Week 1): Met PT Short Term Goal 2 (Week 1): Pt will perform supine <> sit with overall ModA. PT Short Term Goal 2 - Progress (Week 1): Met PT Short Term Goal 3 (Week 1): Pt will perform sit<>stand with MaxA +2 and tolerate stance for >20sec. PT Short Term Goal 3 - Progress (Week 1): Progressing toward goal PT Short Term Goal 4 (Week 1): Pt will initiate slide board transfer training. PT Short Term Goal 4 - Progress (Week 1): Met PT Short Term Goal 5 (Week 1): Pt will complete FIST for outcome measure. PT Short Term Goal 5 - Progress (Week 1):  Met Week 2:  PT Short Term Goal 1 (Week 2): Pt will initiate sit<>stand with R LE prosthetic with LRAD and +2 max A PT Short Term Goal 2 (Week 2): Pt will initiate squat pivot transfer  Skilled Therapeutic Interventions/Progress Updates:      Pt supine in bed upon arrival. Pt agreeable to therapy. Pt reports L LE 8/10 pain at site of wound up to knee. Notified nursing. Pt reports she hasn't gotten out of bed in last 2 days, likely because she is depressed. Pt overall more emotionally labile.   Pt assessed bed mobility. Pt performed rolling R and L with supervision with use of bed rails. Pt donned pants with max A to feed through legs, and pull up, while pt performed rolling with supervision. Pt performed supine<>sit with min A for trunk, with verbal and tactile cues provided for technique.   Pt performed WC<> slideboard transfer to R with supervision/min A, verbal cues provided for head hip ratio and technique.   Pt R LE prosthetic arrived to room, pt has it hidden in a corner so she doesn't see it. Pt fearful of trialing it 2/2 fear of falling. Pt emtionally labile. Utilized therapeutic use of self to offer pt support, encouragement and reassurance. Pt agreeable to putting donning it tomorrow.   Pt performed slideboard transfer WC to/from  mat table with supervision/min A to R and min-mod A to L, verbal cues provided for technique, with therapist faciltiating head hip ratio. Pt attempted sit<>stand from elevated mat table with steady  and +2 max A, unable to clear bottom 2/2 pain and weakness. Discontinued activity.   Pt propelled WC from room to nursing station with min A for steering as pt has tendency to veer to R, verbal cues provided for technique. Pt transported pt remainder distance to main gym 2/2 pt fatigue.   Pt seated in WC at end of session with all needs within reach and seatbelt alarm on.   Therapy Documentation Precautions:  Precautions Precautions: Fall Precaution Comments: L  lateral lower leg open wound, R rotator cuff tears, Restrictions Weight Bearing Restrictions: Yes LLE Weight Bearing: Weight bearing as tolerated  Therapy/Group: Individual Therapy  [facility] [place], [place], DPT  11/30/2022, 7:31 AM

## 2022-11-30 NOTE — Progress Notes (Signed)
Received patient in bed to unit.  Alert and oriented.  Informed consent signed and in chart.   TX duration:4 hours  Patient tolerated well.  Transported back to the room  Alert, without acute distress.  Hand-off given to patient's nurse.   Access used: catheter Access issues: none  Total UF removed: 3000 mls Medication(s) given: none Post HD VS: 150/54 Post HD weight: 88 kg     11/29/22 2030  Vitals  Temp 97.8 F (36.6 C)  Temp Source Oral  BP (!) 155/62  MAP (mmHg) 90  BP Location Right Arm  BP Method Automatic  Patient Position (if appropriate) Lying  Pulse Rate 63  Pulse Rate Source Monitor  ECG Heart Rate 65  Resp 13  Oxygen Therapy  SpO2 100 %  O2 Device Nasal Cannula  During Treatment Monitoring  Intra-Hemodialysis Comments Tx completed  Post Treatment  Dialyzer Clearance Lightly streaked  Duration of HD Treatment -hour(s) 4 hour(s)  Hemodialysis Intake (mL) 0 mL  Liters Processed 96  Fluid Removed (mL) 3000 mL  Tolerated HD Treatment Yes  Post-Hemodialysis Comments HD tx achieved as expected, tolerated well, no complaints  AVG/AVF Arterial Site Held (minutes) 0 minutes  AVG/AVF Venous Site Held (minutes) 0 minutes  Note  Observations pt is in bed resting, alert, oriented, vrbally responsive  Hemodialysis Catheter Right Subclavian  Placement Date: 06/06/22   Placed prior to admission: Yes  Orientation: Right  Access Location: Subclavian  Site Condition No complications  Blue Lumen Status Flushed;Heparin locked;Dead end cap in place  Red Lumen Status Flushed;Heparin locked;Dead end cap in place  Purple Lumen Status N/A  Catheter fill solution 4% Sodium Citrate  Catheter fill volume (Arterial) 1.9 cc  Catheter fill volume (Venous) 1.9  Dressing Type Transparent  Dressing Status Antimicrobial disc in place  Drainage Description None  Post treatment catheter status Capped and Clamped

## 2022-11-30 NOTE — Progress Notes (Signed)
PROGRESS NOTE   Subjective/Complaints:  Doing alright today, slept ok overnight but it was late because dialysis went late. Pain well controlled. LBM yesterday per patient, but last documented one was 2 days ago. Denies any other complaints or concerns. High CBG this morning, but apparently didn't get her AM dose of insulin yesterday, so that would explain it.    ROS: Denies fever or chills, chest pain,  abd pain, nausea, vomiting, diarrhea,  new changes in motor or sensory function + constipation-improved +left leg pain and phantom pain  Objective:   No results found. Recent Labs    11/29/22 1101  WBC 5.3  HGB 9.6*  HCT 31.4*  PLT 255    Recent Labs    11/29/22 1101  NA 132*  K 3.9  CL 96*  CO2 25  GLUCOSE 196*  BUN 54*  CREATININE 4.43*  CALCIUM 9.0     Intake/Output Summary (Last 24 hours) at 11/30/2022 1111 Last data filed at 11/30/2022 1610 Gross per 24 hour  Intake 240 ml  Output 3000 ml  Net -2760 ml     Pressure Injury 05/23/22 Buttocks Right Deep Tissue Pressure Injury - Purple or maroon localized area of discolored intact skin or blood-filled blister due to damage of underlying soft tissue from pressure and/or shear. (Active)  05/23/22 1715  Location: Buttocks  Location Orientation: Right  Staging: Deep Tissue Pressure Injury - Purple or maroon localized area of discolored intact skin or blood-filled blister due to damage of underlying soft tissue from pressure and/or shear.  Wound Description (Comments):   Present on Admission: Yes     Pressure Injury 08/27/22 Coccyx Mid Stage 2 -  Partial thickness loss of dermis presenting as a shallow open injury with a red, pink wound bed without slough. 46mmx5mmx0 red center unattached edges (Active)  08/27/22 1000  Location: Coccyx  Location Orientation: Mid  Staging: Stage 2 -  Partial thickness loss of dermis presenting as a shallow open injury with a red,  pink wound bed without slough.  Wound Description (Comments): 12mmx5mmx0 red center unattached edges  Present on Admission:     Physical Exam: Vital Signs Blood pressure (!) 111/46, pulse 72, temperature 97.9 F (36.6 C), temperature source Oral, resp. rate 18, height 5\' 5"  (1.651 m), weight 88.1 kg, SpO2 100 %.  Physical Exam Constitutional: No apparent distress.  Sitting in w/c. Chronically ill appearing HENT: [place], AT, MMM Eyes: PERRLA. EOMI. Visual fields grossly intact.  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  Non-labored  off O2 Abdomen: Soft, nontender, nondistended, +bowel sounds Skin:  + Right BKA dry dressing in place + Left calf wound dressed, no drainage through dressings  PRIOR EXAMS:  Neurologic exam: Alert and awake, decreased sensation below distal thighs, cranial nerves II through XII grossly intact Right IJ TDC in place MSK: No abnormal tone noted      + Active range of motion in right shoulder limited in < 90 degrees flexion, abduction due to prior unrepaired shoulder dislocation      Strength:               Moving all  4 extremities      Assessment/Plan: 1. Functional deficits which require 3+ hours per day of interdisciplinary therapy in a comprehensive inpatient rehab setting. Physiatrist is providing close team supervision and 24 hour management of active medical problems listed below. Physiatrist and rehab team continue to assess barriers to discharge/monitor patient progress toward functional and medical goals  Care Tool:  Bathing    Body parts bathed by patient: Right arm, Left arm, Chest, Face   Body parts bathed by helper: Buttocks, Front perineal area, Abdomen, Right upper leg, Left upper leg Body parts n/a: Right lower leg, Left lower leg   Bathing assist Assist Level: Dependent - Patient 0%     Upper Body Dressing/Undressing Upper body dressing   What is the patient wearing?: Hospital gown  only    Upper body assist Assist Level: Maximal Assistance - Patient 25 - 49%    Lower Body Dressing/Undressing Lower body dressing      What is the patient wearing?: Incontinence brief, Hospital gown only, Ace wrap/stump shrinker     Lower body assist Assist for lower body dressing: Dependent - Patient 0%     Toileting Toileting    Toileting assist Assist for toileting: Total Assistance - Patient < 25%     Transfers Chair/bed transfer  Transfers assist  Chair/bed transfer activity did not occur: Safety/medical concerns  Chair/bed transfer assist level: Dependent - mechanical lift     Locomotion Ambulation   Ambulation assist   Ambulation activity did not occur: Safety/medical concerns          Walk 10 feet activity   Assist  Walk 10 feet activity did not occur: Safety/medical concerns        Walk 50 feet activity   Assist Walk 50 feet with 2 turns activity did not occur: Safety/medical concerns         Walk 150 feet activity   Assist Walk 150 feet activity did not occur: Safety/medical concerns         Walk 10 feet on uneven surface  activity   Assist Walk 10 feet on uneven surfaces activity did not occur: Safety/medical concerns         Wheelchair     Assist Is the patient using a wheelchair?: Yes Type of Wheelchair: Manual    Wheelchair assist level: Dependent - Patient 0% Max wheelchair distance: 10 ft    Wheelchair 50 feet with 2 turns activity    Assist        Assist Level: Dependent - Patient 0%   Wheelchair 150 feet activity     Assist      Assist Level: Dependent - Patient 0%   Blood pressure (!) 111/46, pulse 72, temperature 97.9 F (36.6 C), temperature source Oral, resp. rate 18, height 5\' 5"  (1.651 m), weight 88.1 kg, SpO2 100 %.  Medical Problem List and Plan: 1. Functional deficits secondary to right BKA revision 1/24 and left leg wound             -patient may shower with wound  covered             -ELOS/Goals: 10-14 days, min to mod assist PT/OT -Was preparing for initial prosthetic fitting with [employer] prior to admission, they are aware she is coming to rehab, would coordinate trial of prosthetic device while at inpatient rehab   -7/19 expected DC date 2.  Antithrombotics: -DVT/anticoagulation:  Pharmaceutical: Eliquis 5mg  BID             -  antiplatelet therapy: N/A 3. Pain Management:  Tylenol, Robaxin, and Oxycodone prn.  -Patient had altered mental status and twitching suspected due to Lyrica toxicity prior to admission to CIR.  Lyrica was discontinued. -7/2 patient has been avoiding trying oxycodone due to constipation, discussed that we can give additional laxatives with this if needed.  We could try low-dose of Lyrica for phantom pain.  She is currently on Effexor. -7/3 will adjust oxycodone to 2.5 to 5 mg PRN -7/4 doing better with 2.5 oxycodone and robaxin PRN, she reports minimal benefit to phantom pain with gabapentin/lyrica in the past -7/5 continue oxycodone 2.5 mg as needed and Robaxin as needed, may be beneficial to try before therapy if needed 4. Mood/Behavior/Sleep: LCSW to follow for evaluation and support.  --chronic insomnia--has Ambien prn w/ Ramelteron 8mg  QHS, and Lorazepam. -11/22/22 ambien not on orders; Lorazepam also not on orders, but will reorder 0.5mg  QHS PRN, also melatonin 5mg  QHS PRN, will hold off on ambien for now.  -11/23/22 poor sleep again, will schedule lorazepam for at bedtime, leave melatonin PRN to try, cont holding off on ambien for now but may want to consider restarting if sleep continues to be an issue.  -7/5 will schedule melatonin for insomnia, does not appear she has tried this yet while it was as needed -7/6-7/24 better sleep last night, monitor             -antipsychotic agents: N/A 5. Neuropsych/cognition: This patient is capable of making decisions on her own behalf. -11/23/22 pt requesting spiritual care/chaplain,  ordered for this; also may benefit from neuropsych consult this week-- defer to weekday team -7/1 Seen by chaplain today 6. Skin/Wound Care: Monitor wound for healing.  --Wound VAC d/c due to concerns of wound getting macerated per Dr. Lajoyce Corners.  --Dry dressing changes and to add silvadene to wound bed if wound starts getting dry.  -6/28 VAC was removed last night Dr. Lajoyce Corners advises to "Apply small amount of silvadine pulse gauze and ace wrap. Change daily " -11/22/22 yellowish slough/drainage from wound bed, contacted Dr. Lajoyce Corners regarding this, will see if he wants BID dressing changes-- still pending response. Will f/up tomorrow. For now, doesn't look like worsening infection, can monitor closely. Will undress wound and re-photograph tomorrow -11/23/22 wound looks about the same or maybe a little better today, Dr. Lajoyce Corners apparently out of town per patient, will change to BID silvadene and dressing changes for now, to have closer eyes on wound, but may want to touch base with Dr. Lajoyce Corners this week to check if he would like to adjust anything.  -7/3 PA with ortho evaluated her wound today, recommended just dry dressings for now, appreciate assistance -7/5 will continue current wound care orders per orthopedics, patient was seen by Barnie Del NP, Will alert Dr. Lajoyce Corners new image of wound is current as of today  7. Fluids/Electrolytes/Nutrition:  Strict I/O with 1200 cc FR. Monitor intake. Continue supplements/vitamins.  8. Kleb/Proteus/Enterococcus wound infection: Cefazolin for 2 weeks with EOT 11/29/22 per Dr. Thedore Mins 9.  T2DM: Hgb A1c-7.9 08/27/22 -Monitor BS ac/hs and use SSI for elevated BS. Titrate basal insulin as indicated.  -6/28 she is on Semglee 40 units in the morning and 20 units at bedtime.  Increase bedtime Semglee to 22 units --7/4 decrease Semglee to 38 units in AM, 16 in afternoon due to hypoglycemia event -7/5 Cbgs controlled, continue to monitor  -11/29/22 another hypoglycemic event this morning,  mostly <200 during day, will further decrease Semglee to  36U qAM + 14U qPM to see if we have fewer morning lows-- monitor -11/30/22 CBG high this morning but apparently didn't get her AM dose of insulin yesterday-- so today was the first day of the reduced dose. Will have to monitor trend. Of note, pharmacy states her dose PTA was 45U daily, not BID dosing CBG (last 3)  Recent Labs    11/29/22 1107 11/29/22 2248 11/30/22 0602  GLUCAP 181* 155* 395*    10. CAD/biventricular CHF: On GDMT limited due to hypotension.   --Continue Lipitor 80mg  QD   -11/30/22 wt stable, monitor Filed Weights   11/29/22 1619 11/29/22 2040 11/30/22 0529  Weight: (S) 90.7 kg 88 kg 88.1 kg    11. ESRD: HD TTS at the end of the day to help with tolerance of therapy.  Thrombosed AVF not salvageable.                --has been on regular diet-->? Low salt restrictions.   -6/28 nephrology following appreciate assistance, next HD tomorrow  -7/1 HD plans HD today due to orthopnea + SOB -7/2 patient has scheduled HD session again today, hopefully will have benefit to orthopnea although this may be anxiety related also  -7/3 SOB a little improved today- she reports it is at baseline 12. Depression with anxiety d/o, panic attacks: Now on Venlafaxine XR 75mg  QD with Lorazepam at night.--lorazepam not ordered, will reorder 0.5mg  QHS PRN, changed to scheduled starting 11/23/22 as mentioned above.   13. PAF: On Eliquis and amiodarone 200mg  QD for rate control.   -7/1-7 HR stable in 60-70s, continue current     11/30/2022    5:29 AM 11/30/2022    5:06 AM 11/29/2022   10:48 PM  Vitals with BMI  Weight 194 lbs 4 oz    BMI 32.32    Systolic  111 134  Diastolic  46 45  Pulse  72 67    14. Anemia of chronic disease:  -11/22/22 Hgb stable at 8.2 today, monitor labs  -7/2 nephrology ordering Aranesp 15.   H/o GIB due to stercoral ulcer: Avoid constipation. Continue Protonix 40mg  BID, miralax QD , Senokot 2 tabs BID, and PRNs.    -11/23/22 last BM yesterday, monitor  -7/1 Small BM yesterday, increase miralax to BID for constipation   -7/2 last BM yesterday although small, continue sorbitol as needed  -7/3 sorbitol 45ml ordered  -7/5 LBM today, brown, changed miralax from BID to daily  -11/30/22 pt states LBM yesterday but not documented; monitor closely 16. Morbid Obesity. Dietary education  -Body mass index is 32.32 kg/m. 17.  Hypotension  -Taking midodrine 10mg  prior to dialysis  -6/29-30/24 BPs stable, monitor -7/2 consider more regular treatment of midodrine if orthostatic symptoms become limiting  -7/4-6 stable today, continue to monitor  -11/30/22 BP a little lower today but dialysis yesterday; monitor for trend Vitals:   11/29/22 1707 11/29/22 1730 11/29/22 1814 11/29/22 1826  BP: (!) 157/71 (!) 150/78 (!) 155/65 (!) 155/57   11/29/22 1840 11/29/22 1910 11/29/22 1940 11/29/22 2010  BP: (!) 149/56 (!) 148/59 (!) 149/61 (!) 152/64   11/29/22 2030 11/29/22 2040 11/29/22 2248 11/30/22 0506  BP: (!) 155/62 (!) 150/54 (!) 134/45 (!) 111/46    18. Hypothyroidism: continue synthroid qAM 19. Oxygen dependence/chronic respiratory failure: pt states she uses PRN O2 at home, mostly related to anxiety; ordered O2 2L via [place] to maintain SpO2 >90%. Pt with clear lung sounds, doubt need for emergent work up today.  Monitor  -Okay to trial increasing time off nasal cannula O2, provide reassurance  -11/30/22 Tolerating Off O2 this AM    LOS: 10 days A FACE TO FACE EVALUATION WAS PERFORMED  [address] 11/30/2022, 11:11 AM

## 2022-11-30 NOTE — Progress Notes (Signed)
[facility] Progress Note    Dialysis Orders: [facility] TTS  4hr, 400/800, EDW 91.5kg, 2K/2Ca, TDC, heparin 4000U bolus  - Mircera 75 mcg IV q 2 weeks  - last 6/8 - Calcitriol 0.5 three times per week    Assessment/Plan: LLE wound infection: With maggot infestation on admission. Blood Cx negative. S/p OR debridement/wound vac on 6/21. Wound Cx grew Proteus + Klebsiella + Enterococcus: Plan is for Cefazolin 2g q HD until 11/28/22 (not given on 7/5 as she received a dose on 7/4 post dialysis), final dose  given 7/4. ESRD: Usual TTS schedule. "Extra" HD 7/1 due to dyspnea/orthopnea, then back to usual schedule. Tolerated HD Thur with 4L net UF w/o cramping but on 7/6 3L net UF with cramping. She is likely at her EDW. Next HD on Tuesday.  Thrombosed AVF - not salvageable, TDC in use. For new access in near future, already has appt. Hypotension/volume: BP low - gets midodrine pre-HD. + SOB/orthopnea earlier this week - some improvement. Has dense thigh edema. Keep challenging, needs lower EDW on discharge.  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  Metabolic bone disease: CorrCa up slightly, continue Renvela as binder. VDRA on hold.   Nutrition: Alb low, continue supps. AFib - on amiodarone/Eliquis  T2DM - historically poorly controlled. Insulin per primary AMS/Twitching - felt secondary to Lyrica toxicity in ESRD -> discontinued. Dispo: In CIR.  Subjective:  Seen in room about to go to therapy and try new prosthetic. Some cramping w/ HD on 7/6.    Objective Vitals:   11/29/22 2040 11/29/22 2248 11/30/22 0506 11/30/22 0529  BP: (!) 150/54 (!) 134/45 (!) 111/46   Pulse: 66 67 72   Resp: 13 16 18    Temp:  98.1 F (36.7 C) 97.9 F (36.6 C)   TempSrc:  Oral Oral   SpO2: 100% 100% 100%   Weight: 88 kg   88.1 kg  Height:       Physical Exam General: Chronically ill appearing woman, NAD. Nasal O2 in place Heart: RRR; no murmur Lungs:  CTAB; no rales Abdomen: soft Extremities: Dense thigh/hip edema. R BKA, LLE bandaged Dialysis Access: [facility]  Additional Objective Labs: Basic Metabolic Panel: Recent Labs  Lab 11/24/22 1413 11/25/22 1330 11/29/22 1101  NA 134* 134* 132*  K 3.9 4.0 3.9  CL 96* 97* 96*  CO2 26 26 25   GLUCOSE 229* 187* 196*  BUN 68* 52* 54*  CREATININE 5.12* 4.30* 4.43*  CALCIUM 9.0 8.9 9.0  PHOS 5.0* 4.4  --    Liver Function Tests: Recent Labs  Lab 11/24/22 1413 11/25/22 1330  ALBUMIN 2.7* 2.6*   CBC: Recent Labs  Lab 11/24/22 1430 11/25/22 1330 11/29/22 1101  WBC 5.6 8.0 5.3  NEUTROABS  --   --  3.8  HGB 8.7* 8.6* 9.6*  HCT 29.6* 28.7* 31.4*  MCV 80.0 79.5* 78.1*  PLT 208 213 255   Medications:    (feeding supplement) PROSource Plus  30 mL Oral BID BM   amiodarone  200 mg Oral Daily   apixaban  5 mg Oral BID   ascorbic acid  500 mg Oral Daily   atorvastatin  80 mg Oral Daily   Chlorhexidine Gluconate Cloth  6 each Topical BID   darbepoetin (ARANESP) injection - DIALYSIS  150 mcg Subcutaneous Q Wed-1800   feeding supplement (NEPRO CARB STEADY)  237 mL Oral BID BM   folic acid  1 mg Oral Daily  insulin aspart  0-6 Units Subcutaneous TID WC   insulin glargine-yfgn  14 Units Subcutaneous QHS   insulin glargine-yfgn  36 Units Subcutaneous Daily   levothyroxine  75 mcg Oral QAC breakfast   LORazepam  0.5 mg Oral QHS   melatonin  5 mg Oral QHS   midodrine  10 mg Oral Q T,Th,Sa-HD   multivitamin  1 tablet Oral QHS   pantoprazole  40 mg Oral BID AC   polyethylene glycol  17 g Oral Daily   ramelteon  8 mg Oral QHS   senna-docusate  2 tablet Oral BID   sevelamer carbonate  1,600 mg Oral TID WC   sorbitol  45 mL Oral Once   venlafaxine XR  75 mg Oral Q breakfast   zinc sulfate  220 mg Oral Daily

## 2022-11-30 NOTE — Progress Notes (Addendum)
Patient arrived back on unit about 2245. Vitals and blood sugar done. Was able to retrieve a sandwich meal kit for patient to have-ate about 75%. Robaxin given for left leg cramping.

## 2022-12-01 DIAGNOSIS — I953 Hypotension of hemodialysis: Secondary | ICD-10-CM

## 2022-12-01 DIAGNOSIS — I739 Peripheral vascular disease, unspecified: Secondary | ICD-10-CM

## 2022-12-01 LAB — GLUCOSE, CAPILLARY
Glucose-Capillary: 200 mg/dL — ABNORMAL HIGH (ref 70–99)
Glucose-Capillary: 217 mg/dL — ABNORMAL HIGH (ref 70–99)
Glucose-Capillary: 296 mg/dL — ABNORMAL HIGH (ref 70–99)
Glucose-Capillary: 376 mg/dL — ABNORMAL HIGH (ref 70–99)
Glucose-Capillary: 417 mg/dL — ABNORMAL HIGH (ref 70–99)
Glucose-Capillary: 374 mg/dL — ABNORMAL HIGH (ref 70–99)

## 2022-12-01 LAB — GLUCOSE, RANDOM: Glucose, Bld: 417 mg/dL — ABNORMAL HIGH (ref 70–99)

## 2022-12-01 MED ORDER — CHLORHEXIDINE GLUCONATE CLOTH 2 % EX PADS: 6.0000 | MEDICATED_PAD | Freq: Every day | CUTANEOUS | Status: DC

## 2022-12-01 MED ORDER — INSULIN ASPART 100 UNIT/ML IJ SOLN
3.0000 [IU] | Freq: Once | INTRAMUSCULAR | Status: AC
  Administered 2022-12-01: 3 [IU] via SUBCUTANEOUS

## 2022-12-01 MED ORDER — METHOCARBAMOL 750 MG PO TABS
750.0000 mg | ORAL_TABLET | Freq: Four times a day (QID) | ORAL | Status: DC | PRN
  Administered 2022-12-01 – 2022-12-10 (×10): 750 mg via ORAL
  Filled 2022-12-01 (×9): qty 1

## 2022-12-01 NOTE — Progress Notes (Addendum)
[facility] Progress Note   Subjective: Up in WC. Denies SOB sitting up but had orthopnea last PM. Have been serially lowering EDW. Educated pt to keep HOB ^ 30 degrees at night until volume is optimized.   Objective Vitals:   11/30/22 0529 11/30/22 1318 11/30/22 1827 12/01/22 0413  BP:  (!) 124/45 (!) 106/53 (!) 127/55  Pulse:  70 71 67  Resp:  16 18 18   Temp:  97.8 F (36.6 C) 97.8 F (36.6 C) 98.1 F (36.7 C)  TempSrc:  Oral Oral Oral  SpO2:  100% 100% 100%  Weight: 88.1 kg     Height:       Physical Exam General: chronically ill appearing older elderly female in NAD Heart: s1,S2 no M/R/G Lungs: CTAB A/P Abdomen: soft, NABS Extremities:R BKA LLE with dressings in place. Edema in hips Dialysis Access: [facility] TDC drsg intact    Additional Objective Labs: Basic Metabolic Panel: Recent Labs  Lab 11/24/22 1413 11/25/22 1330 11/29/22 1101  NA 134* 134* 132*  K 3.9 4.0 3.9  CL 96* 97* 96*  CO2 26 26 25   GLUCOSE 229* 187* 196*  BUN 68* 52* 54*  CREATININE 5.12* 4.30* 4.43*  CALCIUM 9.0 8.9 9.0  PHOS 5.0* 4.4  --    Liver Function Tests: Recent Labs  Lab 11/24/22 1413 11/25/22 1330  ALBUMIN 2.7* 2.6*   No results for input(s): "LIPASE", "AMYLASE" in the last 168 hours. CBC: Recent Labs  Lab 11/24/22 1430 11/25/22 1330 11/29/22 1101  WBC 5.6 8.0 5.3  NEUTROABS  --   --  3.8  HGB 8.7* 8.6* 9.6*  HCT 29.6* 28.7* 31.4*  MCV 80.0 79.5* 78.1*  PLT 208 213 255   Blood Culture    Component Value Date/Time   SDES TISSUE 11/14/2022 0919   SPECREQUEST NONE 11/14/2022 0919   CULT  11/14/2022 0919    FEW PROTEUS MIRABILIS RARE KLEBSIELLA PNEUMONIAE NO ANAEROBES ISOLATED FEW ENTEROCOCCUS FAECALIS    REPTSTATUS 11/19/2022 FINAL 11/14/2022 0919    Cardiac Enzymes: No results for input(s): "CKTOTAL", "CKMB", "CKMBINDEX", "TROPONINI" in the last 168 hours. CBG: Recent Labs  Lab 11/30/22 0602 11/30/22 1143 11/30/22 1643 11/30/22 2118  12/01/22 0636  GLUCAP 395* 345* 323* 277* 217*   Iron Studies: No results for input(s): "IRON", "TIBC", "TRANSFERRIN", "FERRITIN" in the last 72 hours. @lablastinr3 @ Studies/Results: No results found. Medications:   (feeding supplement) PROSource Plus  30 mL Oral BID BM   amiodarone  200 mg Oral Daily   apixaban  5 mg Oral BID   ascorbic acid  500 mg Oral Daily   atorvastatin  80 mg Oral Daily   Chlorhexidine Gluconate Cloth  6 each Topical BID   darbepoetin (ARANESP) injection - DIALYSIS  150 mcg Subcutaneous Q Wed-1800   feeding supplement (NEPRO CARB STEADY)  237 mL Oral BID BM   folic acid  1 mg Oral Daily   insulin aspart  0-6 Units Subcutaneous TID WC   insulin glargine-yfgn  14 Units Subcutaneous QHS   insulin glargine-yfgn  36 Units Subcutaneous Daily   levothyroxine  75 mcg Oral QAC breakfast   LORazepam  0.5 mg Oral QHS   melatonin  5 mg Oral QHS   midodrine  10 mg Oral Q T,Th,Sa-HD   multivitamin  1 tablet Oral QHS   pantoprazole  40 mg Oral BID AC   polyethylene glycol  17 g Oral Daily   ramelteon  8 mg Oral QHS   senna-docusate  2  tablet Oral BID   sevelamer carbonate  1,600 mg Oral TID WC   sorbitol  45 mL Oral Once   venlafaxine XR  75 mg Oral Q breakfast   zinc sulfate  220 mg Oral Daily     [facility] TTS  4hr, 400/800, EDW 91.5kg, 2K/2Ca, TDC, heparin 4000U bolus  - Mircera 75 mcg IV q 2 weeks  - last 6/8 - Calcitriol 0.5 three times per week    Assessment/Plan: LLE wound infection: With maggot infestation on admission. Blood Cx negative. S/p OR debridement/wound vac on 6/21. Wound Cx grew Proteus + Klebsiella + Enterococcus: Plan is for Cefazolin 2g q HD until 11/28/22 (not given on 7/5 as she received a dose on 7/4 post dialysis), final dose  given 7/4. ESRD: Usual TTS schedule. "Extra" HD 7/1 due to dyspnea/orthopnea, then back to usual schedule. Tolerated HD Thur with 4L net UF w/o cramping but on 7/6 3L net UF with cramping. She is likely at her EDW. Next HD  on 12/02/2022 Thrombosed AVF - not salvageable, TDC in use. For new access in near future, already has appt. Hypotension/volume: BP low - gets midodrine pre-HD. Has dense thigh edema. Keep challenging, needs lower EDW on discharge.  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  Metabolic bone disease: CorrCa up slightly, continue Renvela as binder. VDRA on hold.   Nutrition: Alb low, continue supps. AFib - on amiodarone/Eliquis  T2DM - historically poorly controlled. Insulin per primary AMS/Twitching - felt secondary to Lyrica toxicity in ESRD -> discontinued. Dispo: In CIR.  Nonnie Pickney H. Ewan Grau NP-C 12/01/2022, 11:20 AM  [employer] [phone]

## 2022-12-01 NOTE — Progress Notes (Signed)
Occupational Therapy Session Note  Patient Details  Name: Susan [name] MRN: [id number] Date of Birth: 1951/04/30  Today's Date: 12/01/2022 OT Individual Time: 0800-0845 1st Session, 1430-1525 2nd Session  OT Individual Time Calculation (min): 45 min, 55 min    Short Term Goals: Week 2:  OT Short Term Goal 1 (Week 2): Pt will don LB garments with Mod A. OT Short Term Goal 2 (Week 2): Pt will perform UB grooming/hygiene at sink-side with setup A. OT Short Term Goal 3 (Week 2): Pt will perform slide board transfer with +1 Mod A.  Skilled Therapeutic Interventions/Progress Updates:    Session 1:  Pt seen for am session for OT. Pt bed level and open to all therapy with reports of feeling better than Friday's fatigue but still with L LE pain. Pt reports already been bathed but unaware of BM in brief when assessed. Bed level peri hygiene and bathing buttocks conducted with max A. Reapplied dry brief. OT focus on LE dressing with donning sequence and rolling for pulling up elastic waisted pants with overall max fading to mod A. Supine to sit to EOB with bed features and rails with min A.  TB placement with mod A to R side and transfer  to pt's own w/c brought in by husband with min A and min cues. Needs reinforcement to move to center and align in w/c. Sink side oral care and pull over shirt with set up only. Pt left w/c level with LE's on proper rests with pillows for comfort. Chair alarm, needs and nurse call button in reach.    Pain: L LE cramping with pillow props and mm relaxer 8/10 down to 7/10 after session from bed to w/c.    Session 2:  Pt seen for skilled OT session with pt bed level upon OT arrival on bedpan. Allowed time for BM and voiding (see Flowsheet) while OT set up bathing items. Pt with significant increased bed mobility for bedpan use, peri and buttocks hygiene for rolling, some bridging and LE abduction to access skin folds for drying and skin protection. OT applied sacral  and inner buttock fold pads above skin barrier cream. Pt moved to EOB with min A. Doffed pull over shirt with S and donned hospital gown with set up. Scooted up the bed laterally with min A. Sit to supine with CGA. Pt left with nursing with bed exit engaged, needs and nurse call button in reach.   Pain: 5/10 L LE in bed resting   Therapy Documentation Precautions:  Precautions Precautions: Fall Precaution Comments: L lateral lower leg open wound, R rotator cuff tears, Restrictions Weight Bearing Restrictions: Yes LLE Weight Bearing: Weight bearing as tolerated    Therapy/Group: Individual Therapy  Vicenta Dunning 12/01/2022, 7:32 AM

## 2022-12-01 NOTE — Progress Notes (Addendum)
PROGRESS NOTE   Subjective/Complaints:  C/o lot of spasms in left leg. Medication helps somewhat. Sugars up and down. Apprehensive about trying right BK prosthesis. Worried about falling.   ROS: Patient denies fever, rash, sore throat, blurred vision, dizziness, nausea, vomiting, diarrhea, cough, shortness of breath or chest pain,  headache, or mood change.     Objective:   No results found. Recent Labs    11/29/22 1101  WBC 5.3  HGB 9.6*  HCT 31.4*  PLT 255    Recent Labs    11/29/22 1101  NA 132*  K 3.9  CL 96*  CO2 25  GLUCOSE 196*  BUN 54*  CREATININE 4.43*  CALCIUM 9.0     Intake/Output Summary (Last 24 hours) at 12/01/2022 1023 Last data filed at 12/01/2022 0755 Gross per 24 hour  Intake 780 ml  Output 101 ml  Net 679 ml     Pressure Injury 05/23/22 Buttocks Right Deep Tissue Pressure Injury - Purple or maroon localized area of discolored intact skin or blood-filled blister due to damage of underlying soft tissue from pressure and/or shear. (Active)  05/23/22 1715  Location: Buttocks  Location Orientation: Right  Staging: Deep Tissue Pressure Injury - Purple or maroon localized area of discolored intact skin or blood-filled blister due to damage of underlying soft tissue from pressure and/or shear.  Wound Description (Comments):   Present on Admission: Yes     Pressure Injury 08/27/22 Coccyx Mid Stage 2 -  Partial thickness loss of dermis presenting as a shallow open injury with a red, pink wound bed without slough. 67mmx5mmx0 red center unattached edges (Active)  08/27/22 1000  Location: Coccyx  Location Orientation: Mid  Staging: Stage 2 -  Partial thickness loss of dermis presenting as a shallow open injury with a red, pink wound bed without slough.  Wound Description (Comments): 109mmx5mmx0 red center unattached edges  Present on Admission:     Physical Exam: Vital Signs Blood pressure (!)  127/55, pulse 67, temperature 98.1 F (36.7 C), temperature source Oral, resp. rate 18, height 5\' 5"  (1.651 m), weight 88.1 kg, SpO2 100 %.  Constitutional: No distress . Vital signs reviewed. HEENT: NCAT, EOMI, oral membranes moist Neck: supple Cardiovascular: RRR without murmur. No JVD    Respiratory/Chest: CTA Bilaterally without wheezes or rales. Normal effort    GI/Abdomen: BS +, non-tender, non-distended Ext: no clubbing, cyanosis, trace RLE edema Psych: pleasant and cooperative, anxious at times Skin:  + Right BKA wound essentially healed.  + Left calf wound dressed, no drainage through dressings Coccyx, buttock wounds dressed    Neurologic exam: Alert and awake, decreased sensation below distal thighs, cranial nerves II through XII grossly intact Right IJ TDC in place MSK: No abnormal tone noted      + Active range of motion in right shoulder limited in < 90 degrees flexion, abduction due to prior unrepaired shoulder dislocation            Assessment/Plan: 1. Functional deficits which require 3+ hours per day of interdisciplinary therapy in a comprehensive inpatient rehab setting. Physiatrist is providing close team supervision and 24 hour management of active medical problems listed below. Physiatrist  and rehab team continue to assess barriers to discharge/monitor patient progress toward functional and medical goals  Care Tool:  Bathing    Body parts bathed by patient: Right arm, Left arm, Chest, Face   Body parts bathed by helper: Buttocks, Front perineal area, Abdomen, Right upper leg, Left upper leg Body parts n/a: Right lower leg, Left lower leg   Bathing assist Assist Level: Dependent - Patient 0%     Upper Body Dressing/Undressing Upper body dressing   What is the patient wearing?: Hospital gown only    Upper body assist Assist Level: Maximal Assistance - Patient 25 - 49%    Lower Body Dressing/Undressing Lower body dressing      What is the patient  wearing?: Incontinence brief, Hospital gown only, Ace wrap/stump shrinker     Lower body assist Assist for lower body dressing: Dependent - Patient 0%     Toileting Toileting    Toileting assist Assist for toileting: Total Assistance - Patient < 25%     Transfers Chair/bed transfer  Transfers assist  Chair/bed transfer activity did not occur: Safety/medical concerns  Chair/bed transfer assist level: Dependent - mechanical lift     Locomotion Ambulation   Ambulation assist   Ambulation activity did not occur: Safety/medical concerns          Walk 10 feet activity   Assist  Walk 10 feet activity did not occur: Safety/medical concerns        Walk 50 feet activity   Assist Walk 50 feet with 2 turns activity did not occur: Safety/medical concerns         Walk 150 feet activity   Assist Walk 150 feet activity did not occur: Safety/medical concerns         Walk 10 feet on uneven surface  activity   Assist Walk 10 feet on uneven surfaces activity did not occur: Safety/medical concerns         Wheelchair     Assist Is the patient using a wheelchair?: Yes Type of Wheelchair: Manual    Wheelchair assist level: Dependent - Patient 0% Max wheelchair distance: 10 ft    Wheelchair 50 feet with 2 turns activity    Assist        Assist Level: Dependent - Patient 0%   Wheelchair 150 feet activity     Assist      Assist Level: Dependent - Patient 0%   Blood pressure (!) 127/55, pulse 67, temperature 98.1 F (36.7 C), temperature source Oral, resp. rate 18, height 5\' 5"  (1.651 m), weight 88.1 kg, SpO2 100 %.  Medical Problem List and Plan: 1. 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 I discussed and encouraged trials with new  prosthesis as inpatient rehab is one of the best places to problem solve through issues. She indicated that she will try.    -7/19 expected DC date 2.  Antithrombotics: -DVT/anticoagulation:  Pharmaceutical: Eliquis 5mg  BID             -antiplatelet therapy: N/A 3. Pain Management:  Tylenol, Robaxin, and Oxycodone prn.  -Patient had altered mental status and twitching suspected due to Lyrica toxicity prior to admission to CIR.  Lyrica was discontinued. -  7/2 patient has been avoiding trying oxycodone due to constipation, discussed that we can give additional laxatives with this if needed.  We could try low-dose of Lyrica for phantom pain.  She is currently on Effexor. -7/3 will adjust oxycodone to 2.5 to 5 mg PRN -7/4 doing better with 2.5 oxycodone and robaxin PRN, she reports minimal benefit to phantom pain with gabapentin/lyrica in the past -7/5 continue oxycodone 2.5 mg as needed and Robaxin as needed 7/8 having a lot of spasms. Robaxin is not covering them--increase to 750mg  q6 prn 4. Mood/Behavior/Sleep: LCSW to follow for evaluation and support.  --chronic insomnia--has Ambien prn w/ Ramelteron 8mg  QHS, and Lorazepam. -11/22/22 ambien not on orders; Lorazepam also not on orders, but will reorder 0.5mg  QHS PRN, also melatonin 5mg  QHS PRN, will hold off on ambien for now.  -11/23/22 poor sleep again, will schedule lorazepam for at bedtime, leave melatonin PRN to try, cont holding off on ambien for now but may want to consider restarting if sleep continues to be an issue.  -7/5 will schedule melatonin for insomnia, does not appear she has tried this yet while it was as needed -sleep better?             -antipsychotic agents: N/A 5. Neuropsych/cognition: This patient is capable of making decisions on her own behalf. -11/23/22 pt requesting spiritual care/chaplain, ordered for this; also may benefit from neuropsych consult this week-- defer to weekday team -7/1 Seen by chaplain today 6.  Skin/Wound Care: Monitor wound for healing.  --Wound VAC d/c due to concerns of wound getting macerated per Dr. Lajoyce Corners.  --Dry dressing changes and to add silvadene to wound bed if wound starts getting dry.  -6/28 VAC was removed last night Dr. Lajoyce Corners advises to "Apply small amount of silvadine pulse gauze and ace wrap. Change daily " -11/22/22 yellowish slough/drainage from wound bed, contacted Dr. Lajoyce Corners regarding this, will see if he wants BID dressing changes-- still pending response. Will f/up tomorrow. For now, doesn't look like worsening infection, can monitor closely. Will undress wound and re-photograph tomorrow -11/23/22 wound looks about the same or maybe a little better today, Dr. Lajoyce Corners apparently out of town per patient, will change to BID silvadene and dressing changes for now, to have closer eyes on wound, but may want to touch base with Dr. Lajoyce Corners this week to check if he would like to adjust anything.  -7/3 PA with ortho evaluated her wound today, recommended just dry dressings for now, appreciate assistance -7/8 will continue current wound care orders per orthopedics. WBAT LLE 7. Fluids/Electrolytes/Nutrition:  Strict I/O with 1200 cc FR. Monitor intake. Continue supplements/vitamins.  8. Kleb/Proteus/Enterococcus wound infection: Cefazolin for 2 weeks with EOT 11/29/22 per Dr. Thedore Mins 9.  T2DM: Hgb A1c-7.9 08/27/22 -Monitor BS ac/hs and use SSI for elevated BS. Titrate basal insulin as indicated.  -6/28 she is on Semglee 40 units in the morning and 20 units at bedtime.  Increase bedtime Semglee to 22 units --7/4 decrease Semglee to 38 units in AM, 16 in afternoon due to hypoglycemia event -7/5 Cbgs controlled, continue to monitor  -11/29/22 another hypoglycemic event this morning, mostly <200 during day, will further decrease Semglee to 36U qAM + 14U qPM to see if we have fewer morning lows-- monitor -11/30/22 CBG high this morning but apparently didn't get her AM dose of insulin yesterday-- so today  was the first day of the reduced dose. Will have to monitor trend. Of note, pharmacy states her dose PTA  was 45U daily, not BID dosing 7/8 will adjust each dose of semglee up by 2 units today CBG (last 3)  Recent Labs    11/30/22 1643 11/30/22 2118 12/01/22 0636  GLUCAP 323* 277* 217*    10. CAD/biventricular CHF: On GDMT limited due to hypotension.   --Continue Lipitor 80mg  QD   -11/30/22 wt stable, monitor Filed Weights   11/29/22 1619 11/29/22 2040 11/30/22 0529  Weight: (S) 90.7 kg 88 kg 88.1 kg    11. ESRD: HD TTS at the end of the day to help with tolerance of therapy.  Thrombosed AVF not salvageable.                --has been on regular diet-->? Low salt restrictions.   -6/28 nephrology following appreciate assistance, next HD tomorrow  -7/1 HD plans HD today due to orthopnea + SOB -7/2 patient has scheduled HD session again today, hopefully will have benefit to orthopnea although this may be anxiety related also  -7/8 weights sl down 12. Depression with anxiety d/o, panic attacks: Now on Venlafaxine XR 75mg  QD with Lorazepam at night.--lorazepam not ordered, will reorder 0.5mg  QHS PRN, changed to scheduled starting 11/23/22 as mentioned above.   13. PAF: On Eliquis and amiodarone 200mg  QD for rate control.   -7/1-7 HR stable in 60-70s, continue current     12/01/2022    4:13 AM 11/30/2022    6:27 PM 11/30/2022    1:18 PM  Vitals with BMI  Systolic 127 106 161  Diastolic 55 53 45  Pulse 67 71 70    14. Anemia of chronic disease:  -11/22/22 Hgb stable at 8.2 today, monitor labs  -7/2 nephrology ordering Aranesp 15.   H/o GIB due to stercoral ulcer: Avoid constipation. Continue Protonix 40mg  BID, miralax QD , Senokot 2 tabs BID, and PRNs.   -11/23/22 last BM yesterday, monitor  -7/1 Small BM yesterday, increase miralax to BID for constipation   -7/2 last BM yesterday although small, continue sorbitol as needed  -7/3 sorbitol 45ml ordered  -7/5 LBM today, brown, changed  miralax from BID to daily  -11/30/22   LBM   16. Morbid Obesity. Dietary education  -Body mass index is 32.32 kg/m. 17.  Hypotension  -Taking midodrine 10mg  prior to dialysis  -6/29-30/24 BPs stable, monitor -7/2 consider more regular treatment of midodrine if orthostatic symptoms become limiting  -7/4-6 stable today, continue to monitor  -7/8--continue midodrine, volume mgt per renal Vitals:   11/29/22 1826 11/29/22 1840 11/29/22 1910 11/29/22 1940  BP: (!) 155/57 (!) 149/56 (!) 148/59 (!) 149/61   11/29/22 2010 11/29/22 2030 11/29/22 2040 11/29/22 2248  BP: (!) 152/64 (!) 155/62 (!) 150/54 (!) 134/45   11/30/22 0506 11/30/22 1318 11/30/22 1827 12/01/22 0413  BP: (!) 111/46 (!) 124/45 (!) 106/53 (!) 127/55    18. Hypothyroidism: continue synthroid qAM 19. Oxygen dependence/chronic respiratory failure: pt states she uses PRN O2 at home, mostly related to anxiety; ordered O2 2L via [place] to maintain SpO2 >90%. Pt with clear lung sounds, doubt need for emergent work up today. Monitor  -Okay to trial increasing time off nasal cannula O2, provide reassurance  -12/01/22 Tolerating Off O2---anxiety factor I suspect    LOS: 11 days A FACE TO FACE EVALUATION WAS PERFORMED  Ranelle Oyster 12/01/2022, 10:23 AM

## 2022-12-01 NOTE — Progress Notes (Signed)
Physical Therapy Session Note  Patient Details  Name: Susan [name] MRN: [id number] Date of Birth: 11-22-1950  Today's Date: 12/01/2022 PT Individual Time: 4098-1191, 1300-1346 PT Individual Time Calculation (min): 75 min, 46 min   Short Term Goals: Week 2:  PT Short Term Goal 1 (Week 2): Pt will initiate sit<>stand with R LE prosthetic with LRAD and +2 max A PT Short Term Goal 2 (Week 2): Pt will initiate squat pivot transfer  Skilled Therapeutic Interventions/Progress Updates:     Treatment Session 1  Pt seated in WC upon arrival. Pt agreeable to therapy. Pt reports 8/10 L LE pain.PT offered to notify nursing for medication, pt refused muscle relaxer and pain medication.  Discussed trialing sldieboard transfer with use of R LE prosthetic. Pt emotionally labile, crying and very fearful. PT utilized therapeutic use of self to offer support and encouragement, and reassurance; pt ultimately agreeable.   Pt donned silicone liner, sock and prosthetic with max A, Education provided for technique and sequence of donning/ Plan to continue to provide education with this as pt very emotional, likely not fully processing all information given.   Pt transported dependent in WC to main gym. Pt performed slide board transfer WC to mat table x2 in each direction with +1 min A progressing to close supervision, with +2 there for pt reassurance, verbal cues provided for technique, therapist repositioning R LE throughout transfer and provided approximation to R LE. Pt reports pain/discomfort of medial aspect of R knee with socket, doffed prosthetic during last trial.   Kept prosthetic off at end of session, no evidence of any redness/laceration.   Pt seated in WC at end of session with all needs within reach and seatbelt alarm on.   Treatment Session 2   Pt seated in WC upon arrival. Pt agreeable to therapy. Pt reports 8/10 L LE pain, PT offered to notify nursing for medication, pt refused muscle relaxer  and pain medication. Therapist provided rest breaks and repositioning.   Pt agreeable to donning prosthetic with max A, still emotionally labile, but quicker to redirect.   Pt transported to gym dependent in [facility] for time/energy conservation.   Discussed trial of squat pivot transfer as pt has toyota 4 runner and unsafe to perform slide board transfer WC to toyota 4 runner. Pt hesitant but agreeable. Discussed keeping the slide board as bridge for safety, and reassurance.   Pt provided education on technique, pt utilzied B UE/LE to perform forward trunk lean and buttock clearance, with therapist providing +1 min A, with verbal cues for UE/LE positoning and technique.   Pt performed slide board transfer WC <> mat table with +2 mod A in each direction, with therapist repositioning B LE, and providing verbal and tactile cues for technique.   Pt reports need to use bathroom. Pt refusing to use BSC despite encouragement. Pt performed slide board transfer WC<>bed with +1 mod A, with verbal and tactile cues provided for head hip ratio. Pt incontinent of bowel but reports she is not done. Pt performed rolling bilaterally to doff diaper and place bed pan. PT notified tech of pt positoin and need for assist.  Pt supine in bed on bed pan with bed alarm on, call bell and needs within reach.      Therapy Documentation Precautions:  Precautions Precautions: Fall Precaution Comments: L lateral lower leg open wound, R rotator cuff tears, Restrictions Weight Bearing Restrictions: Yes LLE Weight Bearing: Weight bearing as tolerated  Therapy/Group: Individual Therapy  Frankey Poot  Rolly Pancake, [place], DPT  12/01/2022, 7:42 AM

## 2022-12-02 LAB — CBC
HCT: 31.2 % — ABNORMAL LOW (ref 36.0–46.0)
Hemoglobin: 9.7 g/dL — ABNORMAL LOW (ref 12.0–15.0)
MCH: 24.3 pg — ABNORMAL LOW (ref 26.0–34.0)
MCHC: 31.1 g/dL (ref 30.0–36.0)
MCV: 78.2 fL — ABNORMAL LOW (ref 80.0–100.0)
Platelets: 250 10*3/uL (ref 150–400)
RBC: 3.99 MIL/uL (ref 3.87–5.11)
RDW: 17.1 % — ABNORMAL HIGH (ref 11.5–15.5)
WBC: 7 10*3/uL (ref 4.0–10.5)
nRBC: 0 % (ref 0.0–0.2)

## 2022-12-02 LAB — GLUCOSE, CAPILLARY
Glucose-Capillary: 291 mg/dL — ABNORMAL HIGH (ref 70–99)
Glucose-Capillary: 250 mg/dL — ABNORMAL HIGH (ref 70–99)
Glucose-Capillary: 240 mg/dL — ABNORMAL HIGH (ref 70–99)
Glucose-Capillary: 118 mg/dL — ABNORMAL HIGH (ref 70–99)
Glucose-Capillary: 155 mg/dL — ABNORMAL HIGH (ref 70–99)

## 2022-12-02 LAB — RENAL FUNCTION PANEL
Albumin: 3.1 g/dL — ABNORMAL LOW (ref 3.5–5.0)
Anion gap: 12 (ref 5–15)
BUN: 83 mg/dL — ABNORMAL HIGH (ref 8–23)
CO2: 24 mmol/L (ref 22–32)
Calcium: 8.7 mg/dL — ABNORMAL LOW (ref 8.9–10.3)
Chloride: 93 mmol/L — ABNORMAL LOW (ref 98–111)
Creatinine, Ser: 5.54 mg/dL — ABNORMAL HIGH (ref 0.44–1.00)
GFR, Estimated: 8 mL/min — ABNORMAL LOW (ref >60)
Glucose, Bld: 290 mg/dL — ABNORMAL HIGH (ref 70–99)
Phosphorus: 4.7 mg/dL — ABNORMAL HIGH (ref 2.5–4.6)
Potassium: 4.2 mmol/L (ref 3.5–5.1)
Sodium: 129 mmol/L — ABNORMAL LOW (ref 135–145)

## 2022-12-02 MED ORDER — HEPARIN SODIUM (PORCINE) 1000 UNIT/ML IJ SOLN
INTRAMUSCULAR | Status: AC
  Filled 2022-12-02: qty 4

## 2022-12-02 MED ORDER — CLONAZEPAM 0.25 MG PO TBDP
0.2500 mg | ORAL_TABLET | Freq: Two times a day (BID) | ORAL | Status: DC
  Administered 2022-12-02 – 2022-12-03 (×3): 0.25 mg via ORAL
  Filled 2022-12-02 (×3): qty 1

## 2022-12-02 MED ORDER — INSULIN GLARGINE-YFGN 100 UNIT/ML ~~LOC~~ SOLN
16.0000 [IU] | Freq: Every day | SUBCUTANEOUS | Status: DC
  Administered 2022-12-02: 16 [IU] via SUBCUTANEOUS
  Filled 2022-12-02 (×2): qty 0.16

## 2022-12-02 MED ORDER — ALTEPLASE 2 MG IJ SOLR: 2.0000 mg | Freq: Once | INTRAMUSCULAR | Status: DC | PRN

## 2022-12-02 MED ORDER — LIDOCAINE-PRILOCAINE 2.5-2.5 % EX CREA: 1.0000 | TOPICAL_CREAM | CUTANEOUS | Status: DC | PRN

## 2022-12-02 MED ORDER — LIDOCAINE HCL (PF) 1 % IJ SOLN: 5.0000 mL | INTRAMUSCULAR | Status: DC | PRN

## 2022-12-02 MED ORDER — PENTAFLUOROPROP-TETRAFLUOROETH EX AERO: 1.0000 | INHALATION_SPRAY | CUTANEOUS | Status: DC | PRN

## 2022-12-02 MED ORDER — HEPARIN SODIUM (PORCINE) 1000 UNIT/ML DIALYSIS: 4000.0000 [IU] | Freq: Once | INTRAMUSCULAR | Status: DC

## 2022-12-02 MED ORDER — INSULIN GLARGINE-YFGN 100 UNIT/ML ~~LOC~~ SOLN
38.0000 [IU] | Freq: Every day | SUBCUTANEOUS | Status: DC
  Administered 2022-12-03 – 2022-12-05 (×3): 38 [IU] via SUBCUTANEOUS
  Filled 2022-12-02 (×3): qty 0.38

## 2022-12-02 MED ORDER — HEPARIN SODIUM (PORCINE) 1000 UNIT/ML DIALYSIS
4000.0000 [IU] | Freq: Once | INTRAMUSCULAR | Status: AC
  Administered 2022-12-02: 4000 [IU] via INTRAVENOUS_CENTRAL
  Filled 2022-12-02: qty 4

## 2022-12-02 MED ORDER — ANTICOAGULANT SODIUM CITRATE 4% (200MG/5ML) IV SOLN: 5.0000 mL | Status: DC | PRN

## 2022-12-02 MED ORDER — CLONAZEPAM 0.5 MG PO TABS: 0.5000 mg | ORAL_TABLET | Freq: Two times a day (BID) | ORAL | Status: DC

## 2022-12-02 NOTE — Progress Notes (Signed)
Occupational Therapy Session Note  Patient Details  Name: Susan [name] MRN: [id number] Date of Birth: June 25, 1950  {CHL IP REHAB OT TIME CALCULATIONS:[id number]}   Short Term Goals: Week 2:  OT Short Term Goal 1 (Week 2): Pt will don LB garments with Mod A. OT Short Term Goal 2 (Week 2): Pt will perform UB grooming/hygiene at sink-side with setup A. OT Short Term Goal 3 (Week 2): Pt will perform slide board transfer with +1 Mod A.  Skilled Therapeutic Interventions/Progress Updates:    Patient agreeable to participate in OT session. Reports *** pain level.   Patient participated in skilled OT session focusing on ***. Therapist facilitated/assessed/developed/educated/integrated/elicited *** in order to improve/facilitate/promote    Therapy Documentation Precautions:  Precautions Precautions: Fall Precaution Comments: L lateral lower leg open wound, R rotator cuff tears, Restrictions Weight Bearing Restrictions: Yes LLE Weight Bearing: Weight bearing as tolerated  Therapy/Group: Individual Therapy  Limmie Patricia, OTR/L,CBIS  Supplemental OT - [facility] and [facility] Secure Chat Preferred   12/02/2022, 10:56 PM

## 2022-12-02 NOTE — Progress Notes (Signed)
PROGRESS NOTE   Subjective/Complaints:  Didn't sleep well. Admits that anxiety kept her awake despite taking 0.5mg  ativan. Spasms present left thigh but perhaps a little better. Did try using prosthesis yesterday!  ROS: Patient denies fever, rash, sore throat, blurred vision, dizziness, nausea, vomiting, diarrhea, cough, shortness of breath or chest pain,   headache    Objective:   No results found. Recent Labs    11/29/22 1101  WBC 5.3  HGB 9.6*  HCT 31.4*  PLT 255    Recent Labs    11/29/22 1101 12/01/22 2248  NA 132*  --   K 3.9  --   CL 96*  --   CO2 25  --   GLUCOSE 196* 417*  BUN 54*  --   CREATININE 4.43*  --   CALCIUM 9.0  --      Intake/Output Summary (Last 24 hours) at 12/02/2022 1610 Last data filed at 12/02/2022 0810 Gross per 24 hour  Intake 828 ml  Output --  Net 828 ml     Pressure Injury 05/23/22 Buttocks Right Deep Tissue Pressure Injury - Purple or maroon localized area of discolored intact skin or blood-filled blister due to damage of underlying soft tissue from pressure and/or shear. (Active)  05/23/22 1715  Location: Buttocks  Location Orientation: Right  Staging: Deep Tissue Pressure Injury - Purple or maroon localized area of discolored intact skin or blood-filled blister due to damage of underlying soft tissue from pressure and/or shear.  Wound Description (Comments):   Present on Admission: Yes     Pressure Injury 08/27/22 Coccyx Mid Stage 2 -  Partial thickness loss of dermis presenting as a shallow open injury with a red, pink wound bed without slough. 34mmx5mmx0 red center unattached edges (Active)  08/27/22 1000  Location: Coccyx  Location Orientation: Mid  Staging: Stage 2 -  Partial thickness loss of dermis presenting as a shallow open injury with a red, pink wound bed without slough.  Wound Description (Comments): 28mmx5mmx0 red center unattached edges  Present on Admission:      Physical Exam: Vital Signs Blood pressure (!) 126/54, pulse 68, temperature 98 F (36.7 C), temperature source Oral, resp. rate 18, height 5\' 5"  (1.651 m), weight 88.1 kg, SpO2 100 %.  Constitutional: No distress . Vital signs reviewed. HEENT: NCAT, EOMI, oral membranes moist Neck: supple Cardiovascular: RRR without murmur. No JVD    Respiratory/Chest: CTA Bilaterally without wheezes or rales. Normal effort    GI/Abdomen: BS +, non-tender, non-distended Ext: no clubbing, cyanosis, or edema Psych: sl anxious but pleasant and cooperative  Skin:  + Right BKA wound essentially healed.  + Left calf wound clean with pink granulation tissue--about 3" in diameter Coccyx, buttock wounds dressed    Neurologic exam: Alert and awake, decreased sensation below distal thighs, cranial nerves II through XII grossly intact Right IJ TDC in place MSK: No abnormal tone noted      + Active range of motion in right shoulder limited in < 90 degrees flexion, abduction due to prior unrepaired shoulder dislocation--no change            Assessment/Plan: 1. Functional deficits which require 3+ hours per  day of interdisciplinary therapy in a comprehensive inpatient rehab setting. Physiatrist is providing close team supervision and 24 hour management of active medical problems listed below. Physiatrist and rehab team continue to assess barriers to discharge/monitor patient progress toward functional and medical goals  Care Tool:  Bathing    Body parts bathed by patient: Right arm, Left arm, Chest, Face   Body parts bathed by helper: Buttocks, Front perineal area, Abdomen, Right upper leg, Left upper leg Body parts n/a: Right lower leg, Left lower leg   Bathing assist Assist Level: Dependent - Patient 0%     Upper Body Dressing/Undressing Upper body dressing   What is the patient wearing?: Hospital gown only    Upper body assist Assist Level: Maximal Assistance - Patient 25 - 49%    Lower Body  Dressing/Undressing Lower body dressing      What is the patient wearing?: Incontinence brief, Hospital gown only, Ace wrap/stump shrinker     Lower body assist Assist for lower body dressing: Dependent - Patient 0%     Toileting Toileting    Toileting assist Assist for toileting: Total Assistance - Patient < 25%     Transfers Chair/bed transfer  Transfers assist  Chair/bed transfer activity did not occur: Safety/medical concerns  Chair/bed transfer assist level: Dependent - mechanical lift     Locomotion Ambulation   Ambulation assist   Ambulation activity did not occur: Safety/medical concerns          Walk 10 feet activity   Assist  Walk 10 feet activity did not occur: Safety/medical concerns        Walk 50 feet activity   Assist Walk 50 feet with 2 turns activity did not occur: Safety/medical concerns         Walk 150 feet activity   Assist Walk 150 feet activity did not occur: Safety/medical concerns         Walk 10 feet on uneven surface  activity   Assist Walk 10 feet on uneven surfaces activity did not occur: Safety/medical concerns         Wheelchair     Assist Is the patient using a wheelchair?: Yes Type of Wheelchair: Manual    Wheelchair assist level: Dependent - Patient 0% Max wheelchair distance: 10 ft    Wheelchair 50 feet with 2 turns activity    Assist        Assist Level: Dependent - Patient 0%   Wheelchair 150 feet activity     Assist      Assist Level: Dependent - Patient 0%   Blood pressure (!) 126/54, pulse 68, temperature 98 F (36.7 C), temperature source Oral, resp. rate 18, height 5\' 5"  (1.651 m), weight 88.1 kg, SpO2 100 %.  Medical Problem List and Plan: 1. Functional deficits secondary to right BKA revision 1/24 and left leg wound             -patient may shower with wound covered             -ELOS/Goals: 10-14 days, min to mod assist PT/OT 7/9 continue to encourage pt  regarding use of prosthesis. Does have some left leg pain (d/t wound) with WB LLE.   -7/19 expected DC date 2.  Antithrombotics: -DVT/anticoagulation:  Pharmaceutical: Eliquis 5mg  BID             -antiplatelet therapy: N/A 3. Pain Management:  Tylenol, Robaxin, and Oxycodone prn.  -Patient had altered mental status and twitching suspected due to Lyrica  toxicity prior to admission to CIR.  Lyrica was discontinued. -7/2 patient has been avoiding trying oxycodone due to constipation, discussed that we can give additional laxatives with this if needed.  We could try low-dose of Lyrica for phantom pain.  She is currently on Effexor. -7/3 will adjust oxycodone to 2.5 to 5 mg PRN -7/4 doing better with 2.5 oxycodone and robaxin PRN, she reports minimal benefit to phantom pain with gabapentin/lyrica in the past -7/5 continue oxycodone 2.5 mg as needed and Robaxin as needed 7/8  Robaxin --increased to 750mg  q6 prn 4. Mood/Behavior/Sleep/Anxiety disorder: LCSW to follow for evaluation and support.  --chronic insomnia--has Ambien prn w/ Ramelteron 8mg  QHS, and Lorazepam. -continue melatonin -dc ativan and try klonopin 0.25mg  bid to start as having anxiety during day too which interferes with therapy.             -antipsychotic agents: N/A 5. Neuropsych/cognition: This patient is capable of making decisions on her own behalf. -11/23/22 pt requesting spiritual care/chaplain, ordered for this; also may benefit from neuropsych consult this week-- defer to weekday team -7/1 Seen by chaplain today 6. Skin/Wound Care: Monitor wound for healing.  --Wound VAC d/c due to concerns of wound getting macerated per Dr. Lajoyce Corners.  --Dry dressing changes and to add silvadene to wound bed if wound starts getting dry.  -6/28 VAC was removed last night Dr. Lajoyce Corners advises to "Apply small amount of silvadine pulse gauze and ace wrap. Change daily " -11/22/22 yellowish slough/drainage from wound bed, contacted Dr. Lajoyce Corners regarding  this, will see if he wants BID dressing changes-- still pending response. Will f/up tomorrow. For now, doesn't look like worsening infection, can monitor closely. Will undress wound and re-photograph tomorrow -11/23/22 wound looks about the same or maybe a little better today, Dr. Lajoyce Corners apparently out of town per patient, will change to BID silvadene and dressing changes for now, to have closer eyes on wound, but may want to touch base with Dr. Lajoyce Corners this week to check if he would like to adjust anything.  -7/3 PA with ortho evaluated her wound today, recommended just dry dressings for now, appreciate assistance -7/9 left leg wound looks great. Continue current dressing. WBAT LLE 7. Fluids/Electrolytes/Nutrition:  Strict I/O with 1200 cc FR. Monitor intake. Continue supplements/vitamins.  8. Kleb/Proteus/Enterococcus wound infection: Cefazolin for 2 weeks with EOT 11/29/22 per Dr. Thedore Mins 9.  T2DM: Hgb A1c-7.9 08/27/22 -Monitor BS ac/hs and use SSI for elevated BS. Titrate basal insulin as indicated.  -6/28 she is on Semglee 40 units in the morning and 20 units at bedtime.  Increase bedtime Semglee to 22 units --7/4 decrease Semglee to 38 units in AM, 16 in afternoon due to hypoglycemia event -7/5 Cbgs controlled, continue to monitor  -11/29/22 another hypoglycemic event this morning, mostly <200 during day, will further decrease Semglee to 36U qAM + 14U qPM to see if we have fewer morning lows-- monitor -11/30/22 CBG high this morning but apparently didn't get her AM dose of insulin yesterday-- so today was the first day of the reduced dose. Will have to monitor trend. Of note, pharmacy states her dose PTA was 45U daily, not BID dosing 7/8 adjsted each dose of semglee up by 2 units ---will adjust 2 more today CBG (last 3)  Recent Labs    12/01/22 2146 12/02/22 0300 12/02/22 0639  GLUCAP 374* 291* 250*    10. CAD/biventricular CHF: On GDMT limited due to hypotension.   --Continue Lipitor 80mg  QD    -  11/30/22 wt stable, monitor Filed Weights   11/29/22 1619 11/29/22 2040 11/30/22 0529  Weight: (S) 90.7 kg 88 kg 88.1 kg    11. ESRD: HD TTS at the end of the day to help with tolerance of therapy.  Thrombosed AVF not salvageable.                --has been on regular diet-->? Low salt restrictions.   -6/28 nephrology following appreciate assistance, next HD tomorrow  -7/1 HD plans HD today due to orthopnea + SOB -7/2 patient has scheduled HD session again today, hopefully will have benefit to orthopnea although this may be anxiety related also  -7/9 volume per nephrololgy 12. Depression with anxiety d/o, panic attacks: Now on Venlafaxine XR 75mg  QD with Lorazepam at night.--lorazepam not ordered, will reorder 0.5mg  QHS PRN, changed to scheduled starting 11/23/22 as mentioned above.   13. PAF: On Eliquis and amiodarone 200mg  QD for rate control.   -7/1-7 HR stable in 60-70s, continue current     12/02/2022    3:02 AM 12/01/2022    7:38 PM 12/01/2022   12:53 PM  Vitals with BMI  Systolic 126 142 161  Diastolic 54 46 67  Pulse 68 72 72    14. Anemia of chronic disease:  -11/22/22 Hgb stable at 8.2 today, monitor labs  -7/2 nephrology ordering Aranesp 15.   H/o GIB due to stercoral ulcer: Avoid constipation. Continue Protonix 40mg  BID, miralax QD , Senokot 2 tabs BID, and PRNs.   -11/23/22 last BM yesterday, monitor  -7/1 Small BM yesterday, increase miralax to BID for constipation   -7/2 last BM yesterday although small, continue sorbitol as needed  -7/3 sorbitol 45ml ordered  -7/5 LBM today, brown, changed miralax from BID to daily  -11/30/22   LBM   16. Morbid Obesity. Dietary education  -Body mass index is 32.32 kg/m. 17.  Hypotension  -Taking midodrine 10mg  prior to dialysis  -6/29-30/24 BPs stable, monitor -7/2 consider more regular treatment of midodrine if orthostatic symptoms become limiting  -7/4-6 stable today, continue to monitor  -7/8--continue midodrine, volume mgt per  renal Vitals:   11/29/22 1940 11/29/22 2010 11/29/22 2030 11/29/22 2040  BP: (!) 149/61 (!) 152/64 (!) 155/62 (!) 150/54   11/29/22 2248 11/30/22 0506 11/30/22 1318 11/30/22 1827  BP: (!) 134/45 (!) 111/46 (!) 124/45 (!) 106/53   12/01/22 0413 12/01/22 1253 12/01/22 1938 12/02/22 0302  BP: (!) 127/55 (!) 164/67 (!) 142/46 (!) 126/54    18. Hypothyroidism: continue synthroid qAM 19. Oxygen dependence/chronic respiratory failure: pt states she uses PRN O2 at home, mostly related to anxiety; ordered O2 2L via [place] to maintain SpO2 >90%. Pt with clear lung sounds, doubt need for emergent work up today. Monitor  -Okay to trial increasing time off nasal cannula O2, provide reassurance  -12/01/22 Tolerating Off O2---anxiety factor I suspect    LOS: 12 days A FACE TO FACE EVALUATION WAS PERFORMED  Ranelle Oyster 12/02/2022, 9:18 AM

## 2022-12-02 NOTE — Plan of Care (Signed)
  Problem: RH Bed to Chair Transfers Goal: LTG Patient will perform bed/chair transfers w/assist (PT) Description: LTG: Patient will perform bed to chair transfers with assistance (PT). Flowsheets (Taken 12/02/2022 0744) LTG: Pt will perform Bed to Chair Transfers with assistance level: (upgraded to min A 2/2 pt progressing quicker than anticipated) Minimal Assistance - Patient > 75%

## 2022-12-02 NOTE — Progress Notes (Signed)
Occupational Therapy Session Note  Patient Details  Name: PHALON KATSAROS MRN: [id number] Date of Birth: 09/26/1950  Today's Date: 12/02/2022 OT Individual Time: 802-900 1st Session, 1110-1205 2nd Session  OT Individual Time Calculation (min):58 min,  55 min   Short Term Goals: Week 2:  OT Short Term Goal 1 (Week 2): Pt will don LB garments with Mod A. OT Short Term Goal 2 (Week 2): Pt will perform UB grooming/hygiene at sink-side with setup A. OT Short Term Goal 3 (Week 2): Pt will perform slide board transfer with +1 Mod A.  Skilled Therapeutic Interventions/Progress Updates:    Session 1:  Pt seen for am self care retraining this am. Pt bed level upon OT arrival. Strong focus on facilitating bed level indep with bathing, dressing and mobility. Pt able to incorporate reacher for donning pants and doffing soiled sock, rolling for pulling up and sock aide for L clean sock donning. Overall max fading to mod A after set up. EOB for UB bathing, dressing and grooming with improved supine to sit with bed features and sitting EOB tolerance. Pt returned to supine for rest prior to w/c OOB transfer next session. Bed exit engaged, needs and nurse call button in reach.    Pain: reported R residual limb flakiness and itchy skin, applied lotion    Session 2:  Pt returned from PT session and requesting bed pan use. OT encouraged DABSC trial but pt deferring until tomorrow due to fatigue and desire to return to bed level for HD. TB lateral scoot from w/c to bed with mod fading to min a to R side. Once at EOB pt required min a sit to supine. Rolling to doff elastic pants to R side with min A, placement on bed pan with mod A. During bed pan time, OT provided privacy and B UE 1 lb weighted balls for bicep curls. Pt able to complete 3 sets of 10 reps. Resistive clips light and medium placement and removal B ly 10 each hand with min support due to hand weakness and sensory loss. Pt left in care of NT to  complete peri hygiene and set up for lunch meal.   Pain: L LE 5/10 resting on pillow prop and assisting with bridging    Therapy Documentation Precautions:  Precautions Precautions: Fall Precaution Comments: L lateral lower leg open wound, R rotator cuff tears, Restrictions Weight Bearing Restrictions: Yes LLE Weight Bearing: Weight bearing as tolerated    Therapy/Group: Individual Therapy  Vicenta Dunning 12/02/2022, 7:50 AM

## 2022-12-02 NOTE — Progress Notes (Signed)
Received patient in bed.Awake,alert and oriented x 4.Consent verified.  Access used : Right HD catheter that worked well. Dressing change done today.  Medicine given: Heparin 4,000 units pre-run bolus.  Duration of treatment: 4 hours.  Fluid removed : 3.8 out of 4 liters UF goal prescribed.  Hemo comment:Severe cramping on her last 10 minutes of legs,thus 3.8 liters.  Hand off to the patient's nurse.

## 2022-12-02 NOTE — Progress Notes (Signed)
Physical Therapy Session Note  Patient Details  Name: Susan [name] MRN: [id number] Date of Birth: 08-Jan-1951  Today's Date: 12/02/2022 PT Individual Time: 0915-1035 PT Individual Time Calculation (min): 80 min   Short Term Goals: Week 2:  PT Short Term Goal 1 (Week 2): Pt will initiate sit<>stand with R LE prosthetic with LRAD and +2 max A PT Short Term Goal 2 (Week 2): Pt will initiate squat pivot transfer for pt transfer into [employer] 4 runner  Skilled Therapeutic Interventions/Progress Updates:     Pt supine in bed upon arrival. Pt agreeable to therapy. Pt reports 8/10 L LE pain 2/2 muscle spasms. PT discussed with nurse to determine if pt has received muscle relaxer this AM. Nurse administered during session. Education provided to pt that medication is PRN, and pt needs to request the medication as it is not scheduled routinely. Pt verbalized understanding. Pt unwilling to take pain medicine.   Pt reports dry skin and flakiness on R residual limb, therapist donned lotion. Pt complains of discomfort with socket during functional transfers, therapist reached out to hanger to schedule follow up with pt to check fitting of prosthetic.   Pt performed L sidelying to sit with mod A, with use of bed rails, verbal cues provided for technique tactile cues provided to facilitate trunk activation at ribs.   Pt donned L LE prosthetic with max A, pt demos emotional lability, pt reports, "looking at my limb makes me sick to my stomach". Pt performed squat pivot transfer bed to [facility] with slide board under pt for pt comfort/reassurance with +1 mod A with +2 present to stabilize WC for safety, verbal and tactile cues provided for technique, UE and LE positioning, therapist repositioning L LE and prosthetic throughout transfer.   Pt self propelled WC from room to nursing station with min A for steering, as pt demos tendency to veer to L. Verbal cues provided for technique and pushing equally bilaterally to  reduce veering, verbal cues provided to correct veering.  While in gym, pt with prosthetic in gym and offered peer support on strategies to assist with coping with the amputation, reducing phantom pain, and moving forward with recovery. Pt feeling extremely appreciative of the interaction and insight and appeared to be in better spirits after talking with pt, therapist provided further education education regarding importance of looking at residual limb daily and performing desensitization techniques to assist with coping and pain management. practiced donning and doffing silicone sleeve, socks and socket with min-mod A while seated in WC, verbal cues provided for scoot forward and raising leg to and pull up pants so patient is able to pull the components up posteriorly on thigh, therapist ensuring no wrinkles on distal aspect of residual limb with all layers; pt demos difficulty hearing the clicks and putting pressure on knee to insert sleeve into socket 2/2 knee pain, pt unable to tolerate therapist pushing down on knee, therapist has to push up through shoe for pt tolerance.  Plan to follow up with prosthetist on Thursday  Pt seated in [facility] at end of session with all needs within reach and seatbelt alarm on.      Therapy Documentation Precautions:  Precautions Precautions: Fall Precaution Comments: L lateral lower leg open wound, R rotator cuff tears, Restrictions Weight Bearing Restrictions: Yes LLE Weight Bearing: Weight bearing as tolerated  Therapy/Group: Individual Therapy  [facility] Ambrose Finland, [place], DPT  12/02/2022, 7:46 AM

## 2022-12-03 LAB — GLUCOSE, CAPILLARY
Glucose-Capillary: 263 mg/dL — ABNORMAL HIGH (ref 70–99)
Glucose-Capillary: 257 mg/dL — ABNORMAL HIGH (ref 70–99)
Glucose-Capillary: 250 mg/dL — ABNORMAL HIGH (ref 70–99)
Glucose-Capillary: 261 mg/dL — ABNORMAL HIGH (ref 70–99)

## 2022-12-03 MED ORDER — HYDROCERIN EX CREA
TOPICAL_CREAM | Freq: Two times a day (BID) | CUTANEOUS | Status: DC
  Filled 2022-12-03 (×2): qty 113

## 2022-12-03 MED ORDER — INSULIN GLARGINE-YFGN 100 UNIT/ML ~~LOC~~ SOLN
18.0000 [IU] | Freq: Every day | SUBCUTANEOUS | Status: DC
  Administered 2022-12-03 – 2022-12-04 (×2): 18 [IU] via SUBCUTANEOUS
  Filled 2022-12-03 (×3): qty 0.18

## 2022-12-03 MED ORDER — CLONAZEPAM 0.25 MG PO TBDP
0.5000 mg | ORAL_TABLET | Freq: Two times a day (BID) | ORAL | Status: DC
  Administered 2022-12-03 – 2022-12-12 (×18): 0.5 mg via ORAL
  Filled 2022-12-03 (×18): qty 2

## 2022-12-03 NOTE — Progress Notes (Signed)
Physical Therapy Session Note  Patient Details  Name: Susan [name] MRN: [id number] Date of Birth: 1950-09-24  Today's Date: 12/03/2022 PT Individual Time: 0800-0846, 1000-1100 PT Individual Time Calculation (min): 46 min, 60 min   Short Term Goals: Week 2:  PT Short Term Goal 1 (Week 2): Pt will initiate sit<>stand with R LE prosthetic with LRAD and +2 max A PT Short Term Goal 2 (Week 2): Pt will initiate squat pivot transfer for pt transfer into [employer] 4 runner  Skilled Therapeutic Interventions/Progress Updates:     Treatment Session 1  Pt supine in bed upon arrival. Pt agreeable to therapy. Pt reports muscle spasms in L LE requesting muscle relaxer, notified nursing, nursing present to administer medication.   Pt reports brief is wet. Pt performed rolling bilaterally with supervision to doff and donn brief.   Pt donned pants with mod A with pt initiating feeding through legs as pt unable even with HOB elevated, pt donned pants over buttocks with mod A with pt rolling bilaterally. Pt performed L sidelying to sit with min A for initiation and CGA for stabilization, verbal cues provided for UE positioning and looking at UE when pushing up from bed.  Pt scooted to EOB with use of bed rails and verbal cues provided for reciprocal scoot technique.   Pt doffed/donned shirt with set up assist while sitting EOB. Pt donned prosthetic while sitting EOB with min-mod A for sleeve and socks, and mod-max A for socket, verbal cues provided for technique with emphasis on ensuring there are no wrinkles in sock or sleeve, verbal cues provided to lift LE and scoot forward decrease in wrinkles. Pt less emotional today when donning prosthetic.   Pt performed squat pivot transfer with +1 max A bed <> WC to R with +2 stabilizing WC, verbal cues provided for head hip ratio, forward trunk lean and pushing through B UE/LE.   Therapist removed and washed prosthetic, education provided on how to wash prosthetic,  therapist donned lotion to R LE as pt skin dry and flaky.   Pt seated in WC at end of session with all needs within reach.   Treatment Session 2   Pt seated in [facility] upon arrival with nursing in room changing L LE dressing. Pt agreeable to therapy. Pt denies any pain.   Pt R LE prosthetic is dry, pt donned sleeve and socks with very light min A, with verbal cues reinforcing rolling technique and pulling up around entire residual limb to reduce wrinkles; pt donned socket with mod-max A to align sleeve into socket, pt better able to tolerate pushing R LE down for clicks however pt unable to hear the clicks.   Pt transported dependent in WC to main gym, pt performed +1 mod A uphill slide board transfer to R. Pt performed the following exercises to increase B LE strength and increase independence with functional transfers and donning/doffing R LE prosthetic, and bed mobilty  1x10 LAQ bilaterally   1x10 alternating hip flexion bilaterally (active assisted for completion within full range)   1x10 L LE heel toe raises  1x10 use of hip flexion and knee flexion to position R LE prosthetic on floor x10, verbal cues provided for technique, pt initially needing to use B UE for assist but progressed to unilateral UE.   1x10 bilaterally performing lateral trunk lean until forearm reaches mat table, and using elbow and opposite UE to push up to upright position, verbal cues provided for UE positining and techniqe  1x15 pt seated EOB while throwing and catching ball to assist pt with anticipated postural control and reduce fear of falling, pt picked up ball from floor with unilateral UE support on mat table and supervision.   Pt performed slide board transfer mat table to WC to slightly down hill surface with supervision/CGA, verbal cues provided for repositioning LE throughout transfer.   Pt seated in WC and handed off to OT at end of session.      Therapy Documentation Precautions:   Precautions Precautions: Fall Precaution Comments: L lateral lower leg open wound, R rotator cuff tears, Restrictions Weight Bearing Restrictions: Yes LLE Weight Bearing: Weight bearing as tolerated  Therapy/Group: Individual Therapy  [facility] Ambrose Finland, [place], DPT  12/03/2022, 7:34 AM

## 2022-12-03 NOTE — Progress Notes (Addendum)
[facility] Progress Note   Subjective: Seen, she looks much better. 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 Edema in hips Dialysis Access: RIJ TDC drsg intact    Additional Objective Labs: Basic Metabolic Panel: Recent Labs  Lab 11/29/22 1101 12/01/22 2248 12/02/22 1358  NA 132*  --  129*  K 3.9  --  4.2  CL 96*  --  93*  CO2 25  --  24  GLUCOSE 196* 417* 290*  BUN 54*  --  83*  CREATININE 4.43*  --  5.54*  CALCIUM 9.0  --  8.7*  PHOS  --   --  4.7*   Liver Function Tests: Recent Labs  Lab 12/02/22 1358  ALBUMIN 3.1*   No results for input(s): "LIPASE", "AMYLASE" in the last 168 hours. CBC: Recent Labs  Lab 11/29/22 1101 12/02/22 1358  WBC 5.3 7.0  NEUTROABS 3.8  --   HGB 9.6* 9.7*  HCT 31.4* 31.2*  MCV 78.1* 78.2*  PLT 255 250   Blood Culture    Component Value Date/Time   SDES TISSUE 11/14/2022 0919   SPECREQUEST NONE 11/14/2022 0919   CULT  11/14/2022 0919    FEW PROTEUS MIRABILIS RARE KLEBSIELLA PNEUMONIAE NO ANAEROBES ISOLATED FEW ENTEROCOCCUS FAECALIS    REPTSTATUS 11/19/2022 FINAL 11/14/2022 0919    Cardiac Enzymes: No results for input(s): "CKTOTAL", "CKMB", "CKMBINDEX", "TROPONINI" in the last 168 hours. CBG: Recent Labs  Lab 12/02/22 1125 12/02/22 1904 12/02/22 2204 12/03/22 0605 12/03/22 1210  GLUCAP 240* 118* 155* 263* 257*   Iron Studies: No results for input(s): "IRON", "TIBC", "TRANSFERRIN", "FERRITIN" in the last 72  hours. @lablastinr3 @ Studies/Results: No results found. Medications:   (feeding supplement) PROSource Plus  30 mL Oral BID BM   amiodarone  200 mg Oral Daily   apixaban  5 mg Oral BID   ascorbic acid  500 mg Oral Daily   atorvastatin  80 mg Oral Daily   Chlorhexidine Gluconate Cloth  6 each Topical BID   clonazepam  0.5 mg Oral BID   darbepoetin (ARANESP) injection - DIALYSIS  150 mcg Subcutaneous Q Wed-1800   feeding supplement (NEPRO CARB STEADY)  237 mL Oral BID BM   folic acid  1 mg Oral Daily   hydrocerin   Topical BID   insulin aspart  0-6 Units Subcutaneous TID WC   insulin glargine-yfgn  18 Units Subcutaneous QHS   insulin glargine-yfgn  38 Units Subcutaneous Daily   levothyroxine  75 mcg Oral QAC breakfast   melatonin  5 mg Oral QHS   midodrine  10 mg Oral Q T,Th,Sa-HD   multivitamin  1 tablet Oral QHS   pantoprazole  40 mg Oral BID AC   polyethylene glycol  17 g Oral Daily   ramelteon  8 mg Oral QHS   senna-docusate  2 tablet Oral BID   sevelamer carbonate  1,600 mg Oral TID WC   sorbitol  45 mL Oral Once   venlafaxine XR  75 mg Oral Q breakfast     [facility] TTS  4hr, 400/800, EDW 91.5kg, 2K/2Ca, TDC, heparin 4000U bolus  - Mircera 75 mcg IV q 2 weeks  - last 6/8 - Calcitriol 0.5 three times per week    Assessment/Plan: LLE wound infection: With maggot infestation on admission. Blood Cx negative. S/p OR debridement/wound vac on 6/21. Wound Cx grew Proteus + Klebsiella + Enterococcus: Plan is for Cefazolin 2g q HD until 11/28/22 (not given on 7/5 as she received a dose on 7/4 post dialysis), final dose  given 7/4. ESRD: Usual TTS schedule. "Extra" HD 7/1 due to dyspnea/orthopnea, then back to usual schedule. Tolerated HD Thur with 4L net UF w/o cramping but on 7/6 3L net UF with cramping. She is likely at her EDW. Next HD on 12/04/2022 Thrombosed AVF - not salvageable, TDC in use. For new access in near future, already has appt. Hypotension/volume: BP low - gets midodrine  pre-HD. Has dense thigh edema. Keep challenging, needs lower EDW on discharge.  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  Metabolic bone disease: CorrCa up slightly, continue Renvela as binder. VDRA on hold.   Nutrition: Alb low, continue supps. AFib - on amiodarone/Eliquis  T2DM - historically poorly controlled. Insulin per primary AMS/Twitching - felt secondary to Lyrica toxicity in ESRD -> discontinued. Dispo: In CIR.  Aubrii Sharpless H. Malachi Kinzler NP-C 12/03/2022, 1:58 PM  [employer] [phone]

## 2022-12-03 NOTE — Patient Care Conference (Signed)
Inpatient RehabilitationTeam Conference and Plan of Care Update Date: 12/03/2022   Time: 12:17 PM    Patient Name: Susan [name]      Medical Record Number: [id number]  Date of Birth: February 17, 1951 Sex: Female         Room/Bed: 4M08C/4M08C-01 Payor Info: Payor: Arna Medici ADVANTAGE / Plan: [employer] PPO / Product Type: *No Product type* /    Admit Date/Time:  11/20/2022  5:28 PM  Primary Diagnosis:  Right below-knee amputee [facility])  Hospital Problems: Principal Problem:   Right below-knee amputee [facility]) Active Problems:   Acute on chronic anemia    Expected Discharge Date: Expected Discharge Date: 12/12/22  Team Members Present: Physician leading conference: Dr. Sula Soda Social Worker Present: Dossie Der, LCSW Nurse Present: Chana Bode, RN PT Present: Ambrose Finland, PT OT Present: Valetta [name], OT PPS Coordinator present : Fae Pippin, SLP     Current Status/Progress Goal Weekly Team Focus  Bowel/Bladder   active bowels, last BM on 07/09, not constipated, current BM regimen working   daily bowel movement   prevention of constipation    Swallow/Nutrition/ Hydration               ADL's   Set up UB self care, max fading to mod A LB self care, progressing with DABSC toileting with TB- mod-max A   S UB, min A LB, mod A slide board transfers to [facility]   fear is a barrier with R BKA site mamangement and prosthetic use, responded well to peer connection    Mobility   rolling supervision bilaterally with bed rails, level slide board transfer +1 supervision-mod A varies with pt fatigue, progressed away from needing +2 for reassurance, squat pivot transfer with +2 mod A and R LE prosthetic, WC mobility mod-max A   min-mod A chair level, sit to stand with max A  barriers to discharge/meting goals: anxiety, fear of falling, pt very apprehensive about prosthetic, B UE/L LE pain/spasms, pt car is a toyota 4 runner    Communication                 Safety/Cognition/ Behavioral Observations               Pain   pain is controlled, current regimen is compatible with patient   be pain free,   increase activity tolerance by being pain free    Skin   BID wound care, granulating wound, xeroform and mepilex on butt   turning q2, to have no infection on wound  make sure wound care order is being followed on schedule      Discharge Planning:  Hoping to get pt to be able to transfer without the hoyer and can do a car transfer to get away from private transport which is expensive. Will need husband to come in for education prior to DC home. Has HH in place and DME from past admissions   Team Discussion: Patient post right BKA with leg wound; bed bound for months PTA and fear of falling. Now WBAT on new left prothesis and spasms/pain addressed.  Patient on target to meet rehab goals: yes, currently needs set up for upper body care and mod assist for lower body care.  Able to use a drop arm BSC with mod -max assist. Able to complete squat pivots with +2 mod assist wearing the right prosthesis. Manages a wheelchair with min assist.  Goals for discharge set for min - mod assist overall.  *See Care Plan  and progress notes for long and short-term goals.   Revisions to Treatment Plan:  N/a   Teaching Needs: Safety, medications, skin care, wound care, prosthetic application/removal, transfers, toileting, etc.   Current Barriers to Discharge: Decreased caregiver support, Home enviroment access/layout, and Wound care  Possible Resolutions to Barriers: Family education     Medical Summary Current Status: pts left leg is healing with granulation. stil a lot of pain with weight bearing LLE, anxiety problems, insomnia  Barriers to Discharge: Medical stability;Behavior/Mood;Complicated Wound   Possible Resolutions to [employer] Focus: ego support by team, added klononpin to regimen to help control day/night anxiety, stopped  lorazepam. continue local wound care, encouraged use of new prosthesis   Continued Need for Acute Rehabilitation Level of Care: The patient requires daily medical management by a physician with specialized training in physical medicine and rehabilitation for the following reasons: Direction of a multidisciplinary physical rehabilitation program to maximize functional independence : Yes Medical management of patient stability for increased activity during participation in an intensive rehabilitation regime.: Yes Analysis of laboratory values and/or radiology reports with any subsequent need for medication adjustment and/or medical intervention. : Yes   I attest that I was present, lead the team conference, and concur with the assessment and plan of the team.   Chana Bode B 12/03/2022, 2:27 PM

## 2022-12-03 NOTE — Progress Notes (Signed)
Patient ID: Susan [name], female   DOB: 1950/10/30, 72 y.o.   MRN: [id number]  Spoke with kay parker-SW at [facility] who voiced they had concerns regarding pt and husband's care of her. Wanted this worker to address with pt while here. Discussed with pt and she reports her husband does the best he can and states: " You know men just think I can get up and walk once I put the prothesis on." She feels she is better now and more prepared to go home. Contacted husband via telephone to update him regarding team conference and progress wife has made this week. Also scheduled him for family education on Thursday from 8-10 am. Aware discharge set for 7/19. Will work toward discharge.

## 2022-12-03 NOTE — Progress Notes (Signed)
PROGRESS NOTE   Subjective/Complaints:  Still didn't sleep that well. Tolerated klonopin without any problems. Still anxious with activity too, but perhaps a little better.   ROS: Patient denies fever, rash, sore throat, blurred vision, dizziness, nausea, vomiting, diarrhea, cough, shortness of breath or chest pain,  headache    Objective:   No results found. Recent Labs    12/02/22 1358  WBC 7.0  HGB 9.7*  HCT 31.2*  PLT 250    Recent Labs    12/01/22 2248 12/02/22 1358  NA  --  129*  K  --  4.2  CL  --  93*  CO2  --  24  GLUCOSE 417* 290*  BUN  --  83*  CREATININE  --  5.54*  CALCIUM  --  8.7*     Intake/Output Summary (Last 24 hours) at 12/03/2022 0815 Last data filed at 12/03/2022 0715 Gross per 24 hour  Intake 576 ml  Output 3800 ml  Net -3224 ml     Pressure Injury 05/23/22 Buttocks Right Deep Tissue Pressure Injury - Purple or maroon localized area of discolored intact skin or blood-filled blister due to damage of underlying soft tissue from pressure and/or shear. (Active)  05/23/22 1715  Location: Buttocks  Location Orientation: Right  Staging: Deep Tissue Pressure Injury - Purple or maroon localized area of discolored intact skin or blood-filled blister due to damage of underlying soft tissue from pressure and/or shear.  Wound Description (Comments):   Present on Admission: Yes     Pressure Injury 08/27/22 Coccyx Mid Stage 2 -  Partial thickness loss of dermis presenting as a shallow open injury with a red, pink wound bed without slough. 85mmx5mmx0 red center unattached edges (Active)  08/27/22 1000  Location: Coccyx  Location Orientation: Mid  Staging: Stage 2 -  Partial thickness loss of dermis presenting as a shallow open injury with a red, pink wound bed without slough.  Wound Description (Comments): 54mmx5mmx0 red center unattached edges  Present on Admission:     Physical Exam: Vital  Signs Blood pressure (!) 140/57, pulse 70, temperature 98.3 F (36.8 C), resp. rate 16, height 5\' 5"  (1.651 m), weight 86.6 kg, SpO2 100 %.  Constitutional: No distress . Vital signs reviewed. HEENT: NCAT, EOMI, oral membranes moist Neck: supple Cardiovascular: RRR without murmur. No JVD    Respiratory/Chest: CTA Bilaterally without wheezes or rales. Normal effort    GI/Abdomen: BS +, non-tender, non-distended Ext: no clubbing, cyanosis, or edema Psych: pleasant and cooperative Skin:  + Right BKA wound essentially healed.   Left calf wound clean has pink granulation tissue--about 3" in diameter Coccyx, buttock wounds dressed    Neurologic exam: Alert and awake, decreased sensation below distal thighs, cranial nerves II through XII grossly intact Right IJ TDC in place MSK: No abnormal tone noted      + Active range of motion in right shoulder limited in < 90 degrees flexion, abduction due to prior unrepaired shoulder dislocation--no change            Assessment/Plan: 1. Functional deficits which require 3+ hours per day of interdisciplinary therapy in a comprehensive inpatient rehab setting. Physiatrist is providing  close team supervision and 24 hour management of active medical problems listed below. Physiatrist and rehab team continue to assess barriers to discharge/monitor patient progress toward functional and medical goals  Care Tool:  Bathing    Body parts bathed by patient: Right arm, Left arm, Chest, Face   Body parts bathed by helper: Buttocks, Front perineal area, Abdomen, Right upper leg, Left upper leg Body parts n/a: Right lower leg, Left lower leg   Bathing assist Assist Level: Dependent - Patient 0%     Upper Body Dressing/Undressing Upper body dressing   What is the patient wearing?: Hospital gown only    Upper body assist Assist Level: Maximal Assistance - Patient 25 - 49%    Lower Body Dressing/Undressing Lower body dressing      What is the  patient wearing?: Incontinence brief, Hospital gown only, Ace wrap/stump shrinker     Lower body assist Assist for lower body dressing: Dependent - Patient 0%     Toileting Toileting    Toileting assist Assist for toileting: Total Assistance - Patient < 25%     Transfers Chair/bed transfer  Transfers assist  Chair/bed transfer activity did not occur: Safety/medical concerns  Chair/bed transfer assist level: Dependent - mechanical lift     Locomotion Ambulation   Ambulation assist   Ambulation activity did not occur: Safety/medical concerns          Walk 10 feet activity   Assist  Walk 10 feet activity did not occur: Safety/medical concerns        Walk 50 feet activity   Assist Walk 50 feet with 2 turns activity did not occur: Safety/medical concerns         Walk 150 feet activity   Assist Walk 150 feet activity did not occur: Safety/medical concerns         Walk 10 feet on uneven surface  activity   Assist Walk 10 feet on uneven surfaces activity did not occur: Safety/medical concerns         Wheelchair     Assist Is the patient using a wheelchair?: Yes Type of Wheelchair: Manual    Wheelchair assist level: Dependent - Patient 0% Max wheelchair distance: 10 ft    Wheelchair 50 feet with 2 turns activity    Assist        Assist Level: Dependent - Patient 0%   Wheelchair 150 feet activity     Assist      Assist Level: Dependent - Patient 0%   Blood pressure (!) 140/57, pulse 70, temperature 98.3 F (36.8 C), resp. rate 16, height 5\' 5"  (1.651 m), weight 86.6 kg, SpO2 100 %.  Medical Problem List and Plan: 1. Functional deficits secondary to right BKA revision 1/24 and left leg wound             -patient may shower with wound covered             -ELOS/Goals: 10-14 days, min to mod assist PT/OT --Continue CIR therapies including PT, OT, and SLP. Interdisciplinary team conference today to discuss goals,  barriers to discharge, and dc planning.   2.  Antithrombotics: -DVT/anticoagulation:  Pharmaceutical: Eliquis 5mg  BID             -antiplatelet therapy: N/A 3. Pain Management:  Tylenol, Robaxin, and Oxycodone prn.  -Patient had altered mental status and twitching suspected due to Lyrica toxicity prior to admission to CIR.  Lyrica was discontinued. -7/2 patient has been avoiding trying oxycodone due to  constipation, discussed that we can give additional laxatives with this if needed.  We could try low-dose of Lyrica for phantom pain.  She is currently on Effexor. -7/3 will adjust oxycodone to 2.5 to 5 mg PRN -7/4 doing better with 2.5 oxycodone and robaxin PRN, she reports minimal benefit to phantom pain with gabapentin/lyrica in the past -7/5 continue oxycodone 2.5 mg as needed and Robaxin as needed 7/8  Robaxin --increased to 750mg  q6 prn---continue 4. Mood/Behavior/Sleep/Anxiety disorder: LCSW to follow for evaluation and support.  --chronic insomnia--has Ambien prn w/ Ramelteron 8mg  QHS, and Lorazepam. -continue melatonin -7/10 adjust klonopin to 0.5mg  bid             -antipsychotic agents: N/A 5. Neuropsych/cognition: This patient is capable of making decisions on her own behalf.   6. Skin/Wound Care: Monitor wound for healing.  --Wound VAC d/c due to concerns of wound getting macerated per Dr. Lajoyce Corners.  --Dry dressing changes and to add silvadene to wound bed if wound starts getting dry.  -6/28 VAC was removed last night Dr. Lajoyce Corners advises to "Apply small amount of silvadine pulse gauze and ace wrap. Change daily " -11/22/22 yellowish slough/drainage from wound bed, contacted Dr. Lajoyce Corners regarding this, will see if he wants BID dressing changes-- still pending response. Will f/up tomorrow. For now, doesn't look like worsening infection, can monitor closely. Will undress wound and re-photograph tomorrow -11/23/22 wound looks about the same or maybe a little better today, Dr. Lajoyce Corners apparently out of  town per patient, will change to BID silvadene and dressing changes for now, to have closer eyes on wound, but may want to touch base with Dr. Lajoyce Corners this week to check if he would like to adjust anything.  -7/3 PA with ortho evaluated her wound today, recommended just dry dressings for now, appreciate assistance -7/9 left leg wound looks great. Continue current dressing. WBAT LLE 7. Fluids/Electrolytes/Nutrition:  Strict I/O with 1200 cc FR. Monitor intake. Continue supplements/vitamins.  8. Kleb/Proteus/Enterococcus wound infection: Cefazolin for 2 weeks with EOT 11/29/22 per Dr. Thedore Mins 9.  T2DM: Hgb A1c-7.9 08/27/22 -Monitor BS ac/hs and use SSI for elevated BS. Titrate basal insulin as indicated.  -6/28 she is on Semglee 40 units in the morning and 20 units at bedtime.  Increase bedtime Semglee to 22 units --7/4 decrease Semglee to 38 units in AM, 16 in afternoon due to hypoglycemia event -7/5 Cbgs controlled, continue to monitor  -11/29/22 another hypoglycemic event this morning, mostly <200 during day, will further decrease Semglee to 36U qAM + 14U qPM to see if we have fewer morning lows-- monitor -11/30/22 CBG high this morning but apparently didn't get her AM dose of insulin yesterday-- so today was the first day of the reduced dose. Will have to monitor trend. Of note, pharmacy states her dose PTA was 45U daily, not BID dosing 7/10 adjusted to 36u and 18u---some improvement so far CBG (last 3)  Recent Labs    12/02/22 1904 12/02/22 2204 12/03/22 0605  GLUCAP 118* 155* 263*    10. CAD/biventricular CHF: On GDMT limited due to hypotension.   --Continue Lipitor 80mg  QD   -11/30/22 wt stable, monitor Filed Weights   11/30/22 0529 12/02/22 1337 12/02/22 1805  Weight: 88.1 kg 91.3 kg 86.6 kg    11. ESRD: HD TTS at the end of the day to help with tolerance of therapy.  Thrombosed AVF not salvageable.                --has  been on regular diet-->? Low salt restrictions.   -6/28 nephrology  following appreciate assistance, next HD tomorrow  -7/1 HD plans HD today due to orthopnea + SOB -7/2 patient has scheduled HD session again today, hopefully will have benefit to orthopnea although this may be anxiety related also  -7/9 volume per nephrololgy 12. Depression with anxiety d/o, panic attacks: Now on Venlafaxine XR 75mg  QD with Lorazepam at night.--lorazepam not ordered, will reorder 0.5mg  QHS PRN, changed to scheduled starting 11/23/22 as mentioned above.   13. PAF: On Eliquis and amiodarone 200mg  QD for rate control.   - HR stable in 60-70s, continue current     12/03/2022    4:45 AM 12/02/2022    7:42 PM 12/02/2022    7:07 PM  Vitals with BMI  Systolic 140 148 034  Diastolic 57 60 61  Pulse 70 66 67    14. Anemia of chronic disease:  -11/22/22 Hgb stable at 8.2 today, monitor labs  -7/2 nephrology ordering Aranesp 15.   H/o GIB due to stercoral ulcer: Avoid constipation. Continue Protonix 40mg  BID, miralax QD , Senokot 2 tabs BID, and PRNs.   -11/23/22 last BM yesterday, monitor  -7/1 Small BM yesterday, increase miralax to BID for constipation   -7/2 last BM yesterday although small, continue sorbitol as needed  -7/3 sorbitol 45ml ordered  -7/5 LBM today, brown, changed miralax from BID to daily  -12/02/22   LBM   16. Morbid Obesity. Dietary education  -Body mass index is 31.77 kg/m. 17.  Hypotension  -Taking midodrine 10mg  prior to dialysis  -6/29-30/24 BPs stable, monitor -7/2 consider more regular treatment of midodrine if orthostatic symptoms become limiting  -7/4-6 stable today, continue to monitor  -7/8--continue midodrine, volume mgt per renal Vitals:   12/02/22 1430 12/02/22 1501 12/02/22 1530 12/02/22 1606  BP: (!) 142/64 139/60 (!) 135/59 (!) 142/62   12/02/22 1630 12/02/22 1700 12/02/22 1732 12/02/22 1754  BP: (!) 141/59 (!) 150/63 (!) 155/66 (!) 149/64   12/02/22 1805 12/02/22 1907 12/02/22 1942 12/03/22 0445  BP: (!) 158/61 (!) 149/61 (!) 148/60 (!)  140/57    18. Hypothyroidism: continue synthroid qAM 19. Oxygen dependence/chronic respiratory failure: pt states she uses PRN O2 at home, mostly related to anxiety; ordered O2 2L via [place] to maintain SpO2 >90%. Pt with clear lung sounds, doubt need for emergent work up today. Monitor  -Okay to trial increasing time off nasal cannula O2, provide reassurance  -12/01/22 Tolerating Off O2---anxiety factor I suspect    LOS: 13 days A FACE TO FACE EVALUATION WAS PERFORMED  Ranelle Oyster 12/03/2022, 8:15 AM

## 2022-12-03 NOTE — Progress Notes (Signed)
Noted CSW note regarding d/c date of 7/19. Contacted [facility] to advise clinic of planned d/c date and that pt should resume care on Sat, July 20. Will assist as needed.   Olivia Canter Renal Navigator [phone]

## 2022-12-03 NOTE — Progress Notes (Signed)
Occupational Therapy Weekly Progress Note  Patient Details  Name: Susan [name] MRN: [id number] Date of Birth: 1950/09/15  Beginning of progress report period: November 26, 2022 End of progress report period: December 03, 2022  Today's Date: 12/03/2022 OT Individual Time: 1100-1155 OT Individual Time Calculation (min): 55 min    Patient has met 4 of 4 short term goals.  Pt now tolerating visualization and handling of R residual limb, prosthetic and skin inspection. Moving from surface to surface exclusively with TB and now progressing with lateral squat pivot transfers. Pt interested in shower next visits and agreeable to progressing with DABSC. MSW planning Family educ with husband asap in preparation for d/c home next week.   Patient continues to demonstrate the following deficits: muscle weakness and muscle joint tightness, decreased cardiorespiratoy endurance and decreased oxygen support, impaired timing and sequencing, unbalanced muscle activation, decreased coordination, and decreased motor planning, and decreased sitting balance, decreased standing balance, decreased postural control, and decreased balance strategies and therefore will continue to benefit from skilled OT intervention to enhance overall performance with BADL, iADL, and Reduce care partner burden.  Patient progressing toward long term goals..  Continue plan of care.  OT Short Term Goals Week 2:  OT Short Term Goal 1 (Week 2): Pt will don LB garments with Mod A. OT Short Term Goal 1 - Progress (Week 2): Progressing toward goal OT Short Term Goal 2 (Week 2): Pt will perform UB grooming/hygiene at sink-side with setup A. OT Short Term Goal 3 (Week 2): Pt will perform slide board transfer with +1 Mod A. OT Short Term Goal 3 - Progress (Week 2): Progressing toward goal Week 3:  OT Short Term Goal 1 (Week 3): STG=LTG based on LOS  Skilled Therapeutic Interventions/Progress Updates:   Pt seen for skilled OT session with another  peer support interaction simultaneously as was successful yesterday with similar. Pt worked with OT on lateral squat pivot transfers from w/c to mat. Weight shifting step by step, lateral scoots across mat overall max fading to mod a. Less fear with R prosthesis and L weight bearing on LE. Trunk therex with peer for lateral weight shifts and core strength then 4 way reaching activity for core and UE strength. OT transported pt back to room, assisted with doffing R prosthesis and liner and donning shrinker. Left w/c level with all needs, safety measures and nurse call button in reach.   Pain: 5/10 L LE with distraction, elevation and repositioning for relief    Therapy Documentation Precautions:  Precautions Precautions: Fall Precaution Comments: L lateral lower leg open wound, R rotator cuff tears, Restrictions Weight Bearing Restrictions: Yes LLE Weight Bearing: Weight bearing as tolerated   Therapy/Group: Individual Therapy  Vicenta Dunning 12/03/2022, 7:43 AM

## 2022-12-04 LAB — GLUCOSE, CAPILLARY
Glucose-Capillary: 131 mg/dL — ABNORMAL HIGH (ref 70–99)
Glucose-Capillary: 209 mg/dL — ABNORMAL HIGH (ref 70–99)
Glucose-Capillary: 156 mg/dL — ABNORMAL HIGH (ref 70–99)
Glucose-Capillary: 269 mg/dL — ABNORMAL HIGH (ref 70–99)

## 2022-12-04 NOTE — Progress Notes (Signed)
Physical Therapy Session Note  Patient Details  Name: Susan [name] MRN: [id number] Date of Birth: [birth year]  Today's Date: 12/04/2022 PT Individual Time: 7829-5621, 3086-5784 PT Individual Time Calculation (min): 36 min, 71 min   Short Term Goals: Week 2:  PT Short Term Goal 1 (Week 2): Pt will initiate sit<>stand with R LE prosthetic with LRAD and +2 max A PT Short Term Goal 2 (Week 2): Pt will initiate squat pivot transfer for pt transfer into toyota 4 runner  Skilled Therapeutic Interventions/Progress Updates:      Treatment Session 1  Pt supine in bed on bed pan. Pt agreeable to therapy. Pt reports 8/10 L LE calf pain 2/2 wound, pt provided rest breaks and repositioning as needed.  Pt denies any spasms today.   Pt on bed pan for increased time, therapist utilized time to follow up regarding home set up, and provide education.   Discussed pt ramp at home. Upon questioning, Pt reports it is not ADA compliant, pt reports her husband built it for her dog, pt reports she was using it at PLOF with her husband pushing her up and down the ramp. Pt reports there are no stairs but it is very steep and goes over threshold. Recommended installing an ADA compliant ramp for safety, handout provided on how to build a ramp. Reached out to Child psychotherapist to verify logistics of entryway to determine if it is safe, or if pt family needs to build a ramp or if they can install a temporary ramp.   Discussed pt current WC, discussed that pt current WC is narrow and pt could benefit from a wider frame WC, pt verbalized understanding but ultimately opts to stay with current WC as pt made adjustments to width of door frames with current WC, and pt has concerns a wider WC will not fit.   PT followed up with pt regarding height of to[place]ota 4 runner from ground as this impacts level of assist needed for transfer. Pt husband brought home measurement sheet provided to pt during previous session, pt awaiting  measurements.   Pt performed rolling bilaterally with supervision, verbal cues provided for lifting leg to assist with pericare cleaning. Upon therapist finishing cleaning, pt reports need to have an additional BM, therapist placed bed pan. Pt on bed pan at end of session with all needs within reach and bed alarm on. Nurse tech notified.   Treatment Session 2   Pt seated in WC upon arrival. Pt agreeable to therapy. Pt reports 8/10 L LE pain at site of wound.   Pt donned R LE silicone sleeve with min A, and sock with supervision, and prosthetic socket with mod A. Gen Team Captain present during session to ensure proper technique with donning as pt complains of discomfort on medial aspect of R LE with slide board and squat pivot transfers. Gen Team Captain provided reassurance that donning technique is correct given temporary prosthetic not custom fit for patient.   Pt initially very fearful and anxious about standing, but agreeable with increased time after PT provided therapeutic use of self to offer support, encouragement and reassurance. Pt attempted sit<>stand in // bars with +1 max A, with +2 A for safety, pt demos significantly more buttock clearance compared to sit<>stand without prosthetic, however pt demos significant genu valgus, likely causing the pain on medial aspect of knee. Therapist opted to discontinue standing until Monday after visit with Susan [name] from [facility] to determine if modifications need to be made to temporary  prosthetic, if another temporary prosthetic will be provided or if pt should wait until OPPT for prosthetic training.   Education provided to pt regarding importance of daily skin inspection, and to alert MD if pt notices anything out of the ordinary on pt R or L LE to reduce risk of infection and additional amputation. Pt verbalized understanding. Pt performed skin inspection on anterior, posterior, medial, lateral, and inferior R LE with skin inspection mirror with min A for  technique. Pt noticed red spot on distal end at incision site. Therapist has not seen it prior to today. Notified nursing.   Pt seated in WC at end of session with all needs within reach and seatbelt alarm on.    Therapy Documentation Precautions:  Precautions Precautions: Fall Precaution Comments: L lateral lower leg open wound, R rotator cuff tears, Restrictions Weight Bearing Restrictions: Yes LLE Weight Bearing: Weight bearing as tolerated  Therapy/Group: Individual Therapy  [facility], Wilkinson, DPT  12/04/2022, 8:04 AM

## 2022-12-04 NOTE — Progress Notes (Signed)
Patient ID: Susan [name], female   DOB: [birth year], 72 y.o.   MRN: [id number]  This SW covering for primary SW Becky Dupree.   SW discussed concerns in relation due to current safety of ramp and explained ADA regulations about ramp. Pt husband indicates that there are hand railings that he installed. SW asked him to take pictures, and send to pt so therapy can review. He intends to bring in home measurement form later on today.   Cecile Sheerer, MSW, LCSWA Office: [phone] Cell: [phone] Fax: [phone]

## 2022-12-04 NOTE — Progress Notes (Signed)
   12/04/22 1750  Vitals  Temp 97.8 F (36.6 C)  Pulse Rate 67  Resp 12  BP (!) 130/56  SpO2 100 %  O2 Device Nasal Cannula  Weight 85.3 kg  Type of Weight Post-Dialysis  Oxygen Therapy  O2 Flow Rate (L/min) 2 L/min  Patient Activity (if Appropriate) In bed  Pulse Oximetry Type Continuous  Oximetry Probe Site Changed No  Post Treatment  Dialyzer Clearance Lightly streaked  Duration of HD Treatment -hour(s) 4 hour(s)  Hemodialysis Intake (mL) 0 mL  Liters Processed 84  Fluid Removed (mL) 3000 mL  Tolerated HD Treatment Yes   Received patient in bed to unit.  Alert and oriented.  Informed consent signed and in chart.   TX duration:4hrs  Patient tolerated well.  Transported back to the room  Alert, without acute distress.  Hand-off given to patient's nurse.   Access used: [facility] Access issues: none  Total UF removed: 3L Medication(s) given: none    [facility]

## 2022-12-04 NOTE — Progress Notes (Signed)
[facility] Progress Note   Subjective:  No new issues. HD today on schedule. UF as tolerated.    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 Edema in hips Dialysis Access: RIJ TDC drsg intact    Additional Objective Labs: Basic Metabolic Panel: Recent Labs  Lab 11/29/22 1101 12/01/22 2248 12/02/22 1358  NA 132*  --  129*  K 3.9  --  4.2  CL 96*  --  93*  CO2 25  --  24  GLUCOSE 196* 417* 290*  BUN 54*  --  83*  CREATININE 4.43*  --  5.54*  CALCIUM 9.0  --  8.7*  PHOS  --   --  4.7*   Liver Function Tests: Recent Labs  Lab 12/02/22 1358  ALBUMIN 3.1*   No results for input(s): "LIPASE", "AMYLASE" in the last 168 hours. CBC: Recent Labs  Lab 11/29/22 1101 12/02/22 1358  WBC 5.3 7.0  NEUTROABS 3.8  --   HGB 9.6* 9.7*  HCT 31.4* 31.2*  MCV 78.1* 78.2*  PLT 255 250   Blood Culture    Component Value Date/Time   SDES TISSUE 11/14/2022 0919   SPECREQUEST NONE 11/14/2022 0919   CULT  11/14/2022 0919    FEW PROTEUS MIRABILIS RARE KLEBSIELLA PNEUMONIAE NO ANAEROBES ISOLATED FEW ENTEROCOCCUS FAECALIS    REPTSTATUS 11/19/2022 FINAL 11/14/2022 0919    Cardiac Enzymes: No results for input(s): "CKTOTAL", "CKMB", "CKMBINDEX", "TROPONINI" in the last 168 hours. CBG: Recent Labs  Lab 12/03/22 1210 12/03/22 1645 12/03/22 2032 12/04/22 0651 12/04/22 1158  GLUCAP 257* 250* 261* 131* 209*   Iron Studies: No results for input(s): "IRON", "TIBC", "TRANSFERRIN", "FERRITIN" in the last  72 hours. @lablastinr3 @ Studies/Results: No results found. Medications:   (feeding supplement) PROSource Plus  30 mL Oral BID BM   amiodarone  200 mg Oral Daily   apixaban  5 mg Oral BID   ascorbic acid  500 mg Oral Daily   atorvastatin  80 mg Oral Daily   Chlorhexidine Gluconate Cloth  6 each Topical BID   clonazepam  0.5 mg Oral BID   darbepoetin (ARANESP) injection - DIALYSIS  150 mcg Subcutaneous Q Wed-1800   feeding supplement (NEPRO CARB STEADY)  237 mL Oral BID BM   folic acid  1 mg Oral Daily   hydrocerin   Topical BID   insulin aspart  0-6 Units Subcutaneous TID WC   insulin glargine-yfgn  18 Units Subcutaneous QHS   insulin glargine-yfgn  38 Units Subcutaneous Daily   levothyroxine  75 mcg Oral QAC breakfast   melatonin  5 mg Oral QHS   midodrine  10 mg Oral Q T,Th,Sa-HD   multivitamin  1 tablet Oral QHS   pantoprazole  40 mg Oral BID AC   polyethylene glycol  17 g Oral Daily   ramelteon  8 mg Oral QHS   senna-docusate  2 tablet Oral BID   sevelamer  carbonate  1,600 mg Oral TID WC   sorbitol  45 mL Oral Once   venlafaxine XR  75 mg Oral Q breakfast     [facility] TTS  4hr, 400/800, EDW 91.5kg, 2K/2Ca, TDC, heparin 4000U bolus  - Mircera 75 mcg IV q 2 weeks  - last 6/8 - Calcitriol 0.5 three times per week    Assessment/Plan: LLE wound infection: With maggot infestation on admission. Blood Cx negative. S/p OR debridement/wound vac on 6/21. Wound Cx grew Proteus + Klebsiella + Enterococcus: Plan is for Cefazolin 2g q HD until 11/28/22 (not given on 7/5 as she received a dose on 7/4 post dialysis), final dose  given 7/4. ESRD: Usual TTS schedule. "Extra" HD 7/1 due to dyspnea/orthopnea, then back to usual schedule. Tolerated HD Thur with 4L net UF w/o cramping but on 7/6 3L net UF with cramping. She is likely at her EDW. Next HD on 12/04/2022 Thrombosed AVF - not salvageable, TDC in use. For new access in near future, already has appt. Hypotension/volume: BP low - gets  midodrine pre-HD. Has dense thigh edema. Keep challenging, needs lower EDW on discharge.  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  Metabolic bone disease: CorrCa up slightly, continue Renvela as binder. VDRA on hold.   Nutrition: Alb low, continue supps. AFib - on amiodarone/Eliquis  T2DM - historically poorly controlled. Insulin per primary AMS/Twitching - felt secondary to Lyrica toxicity in ESRD. Resolved after discontinuing medication.  Dispo: In CIR.    Susan Caris H. Nadia Viar NP-C 12/04/2022, 1:01 PM  [employer] [phone]

## 2022-12-04 NOTE — Plan of Care (Signed)
Educated patient

## 2022-12-04 NOTE — Progress Notes (Signed)
PROGRESS NOTE   Subjective/Complaints:  Pt felt that she didn't have much energy yesterday but says she slept much better last night and feels good this morning. Left leg is sore, and she is hesitant to stand on it, use it as a result.   ROS: Patient denies fever, rash, sore throat, blurred vision, dizziness, nausea, vomiting, diarrhea, cough, shortness of breath or chest pain, joint or back/neck pain, headache    Objective:   No results found. Recent Labs    12/02/22 1358  WBC 7.0  HGB 9.7*  HCT 31.2*  PLT 250    Recent Labs    12/01/22 2248 12/02/22 1358  NA  --  129*  K  --  4.2  CL  --  93*  CO2  --  24  GLUCOSE 417* 290*  BUN  --  83*  CREATININE  --  5.54*  CALCIUM  --  8.7*     Intake/Output Summary (Last 24 hours) at 12/04/2022 0916 Last data filed at 12/04/2022 0847 Gross per 24 hour  Intake 652 ml  Output --  Net 652 ml     Pressure Injury 05/23/22 Buttocks Right Deep Tissue Pressure Injury - Purple or maroon localized area of discolored intact skin or blood-filled blister due to damage of underlying soft tissue from pressure and/or shear. (Active)  05/23/22 1715  Location: Buttocks  Location Orientation: Right  Staging: Deep Tissue Pressure Injury - Purple or maroon localized area of discolored intact skin or blood-filled blister due to damage of underlying soft tissue from pressure and/or shear.  Wound Description (Comments):   Present on Admission: Yes     Pressure Injury 08/27/22 Coccyx Mid Stage 2 -  Partial thickness loss of dermis presenting as a shallow open injury with a red, pink wound bed without slough. 49mmx5mmx0 red center unattached edges (Active)  08/27/22 1000  Location: Coccyx  Location Orientation: Mid  Staging: Stage 2 -  Partial thickness loss of dermis presenting as a shallow open injury with a red, pink wound bed without slough.  Wound Description (Comments): 30mmx5mmx0 red  center unattached edges  Present on Admission:     Physical Exam: Vital Signs Blood pressure (!) 142/66, pulse 66, temperature 97.7 F (36.5 C), temperature source Oral, resp. rate 16, height 5\' 5"  (1.651 m), weight 86.6 kg, SpO2 100%.  Constitutional: No distress . Vital signs reviewed. HEENT: NCAT, EOMI, oral membranes moist Neck: supple Cardiovascular: RRR without murmur. No JVD    Respiratory/Chest: CTA Bilaterally without wheezes or rales. Normal effort    GI/Abdomen: BS +, non-tender, non-distended Ext: no clubbing, cyanosis, or edema Psych: pleasant and cooperative  Skin:  + Right BKA wound essentially healed.   Left calf wound  is pink, but is very dry with 4x4 gauze dried and stuck to wound.  Coccyx, buttock wounds dressed    Neurologic exam: Alert and awake, decreased sensation below distal thighs, cranial nerves II through XII grossly intact Right IJ TDC in place MSK: No abnormal tone noted      + right shoulder rom still somewhat limited            Assessment/Plan: 1. Functional deficits which  require 3+ hours per day of interdisciplinary therapy in a comprehensive inpatient rehab setting. Physiatrist is providing close team supervision and 24 hour management of active medical problems listed below. Physiatrist and rehab team continue to assess barriers to discharge/monitor patient progress toward functional and medical goals  Care Tool:  Bathing    Body parts bathed by patient: Right arm, Left arm, Chest, Face   Body parts bathed by helper: Buttocks, Front perineal area, Abdomen, Right upper leg, Left upper leg Body parts n/a: Right lower leg, Left lower leg   Bathing assist Assist Level: Dependent - Patient 0%     Upper Body Dressing/Undressing Upper body dressing   What is the patient wearing?: Hospital gown only    Upper body assist Assist Level: Maximal Assistance - Patient 25 - 49%    Lower Body Dressing/Undressing Lower body dressing       What is the patient wearing?: Incontinence brief, Hospital gown only, Ace wrap/stump shrinker     Lower body assist Assist for lower body dressing: Dependent - Patient 0%     Toileting Toileting    Toileting assist Assist for toileting: Total Assistance - Patient < 25%     Transfers Chair/bed transfer  Transfers assist  Chair/bed transfer activity did not occur: Safety/medical concerns  Chair/bed transfer assist level: Dependent - mechanical lift     Locomotion Ambulation   Ambulation assist   Ambulation activity did not occur: Safety/medical concerns          Walk 10 feet activity   Assist  Walk 10 feet activity did not occur: Safety/medical concerns        Walk 50 feet activity   Assist Walk 50 feet with 2 turns activity did not occur: Safety/medical concerns         Walk 150 feet activity   Assist Walk 150 feet activity did not occur: Safety/medical concerns         Walk 10 feet on uneven surface  activity   Assist Walk 10 feet on uneven surfaces activity did not occur: Safety/medical concerns         Wheelchair     Assist Is the patient using a wheelchair?: Yes Type of Wheelchair: Manual    Wheelchair assist level: Dependent - Patient 0% Max wheelchair distance: 10 ft    Wheelchair 50 feet with 2 turns activity    Assist        Assist Level: Dependent - Patient 0%   Wheelchair 150 feet activity     Assist      Assist Level: Dependent - Patient 0%   Blood pressure (!) 142/66, pulse 66, temperature 97.7 F (36.5 C), temperature source Oral, resp. rate 16, height 5\' 5"  (1.651 m), weight 86.6 kg, SpO2 100%.  Medical Problem List and Plan: 1. 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   Antithrombotics: -DVT/anticoagulation:  Pharmaceutical: Eliquis 5mg  BID             -antiplatelet therapy: N/A 3. Pain Management:  Tylenol, Robaxin, and Oxycodone prn.  -Patient had altered mental status and twitching suspected due to Lyrica toxicity prior to admission to CIR.  Lyrica was discontinued. -7/2 patient has been avoiding trying oxycodone  due to constipation, discussed that we can give additional laxatives with this if needed.  We could try low-dose of Lyrica for phantom pain.  She is currently on Effexor. -7/3 will adjust oxycodone to 2.5 to 5 mg PRN -7/4 doing better with 2.5 oxycodone and robaxin PRN, she reports minimal benefit to phantom pain with gabapentin/lyrica in the past -7/5 continue oxycodone 2.5 mg as needed and Robaxin as needed 7/8  Robaxin --increased to 750mg  q6 prn---continue 4. Mood/Behavior/Sleep/Anxiety disorder: LCSW to follow for evaluation and support.  --chronic insomnia--has Ambien prn w/ Ramelteron 8mg  QHS, and Lorazepam. -continue melatonin -7/10 adjusted klonopin to 0.5mg  bid. Slept better--obsv 7/11             -antipsychotic agents: N/A 5. Neuropsych/cognition: This patient is capable of making decisions on her own behalf.   6. Skin/Wound Care: Monitor wound for healing.  --Wound VAC d/c due to concerns of wound getting macerated per Dr. Lajoyce Corners.  --Dry dressing changes and to add silvadene to wound bed if wound starts getting dry.  -6/28 VAC was removed last night Dr. Lajoyce Corners advises to "Apply small amount of silvadine pulse gauze and ace wrap. Change daily " -11/22/22 yellowish slough/drainage from wound bed, contacted Dr. Lajoyce Corners regarding this, will see if he wants BID dressing changes-- still pending response. Will f/up tomorrow. For now, doesn't look like worsening infection, can monitor closely. Will undress wound and re-photograph tomorrow -11/23/22 wound looks about the same or maybe a little better today, Dr. Lajoyce Corners apparently out of town per patient,  will change to BID silvadene and dressing changes for now, to have closer eyes on wound, but may want to touch base with Dr. Lajoyce Corners this week to check if he would like to adjust anything.  -7/3 PA with ortho evaluated her wound today, recommended just dry dressings for now, appreciate assistance -7/11 left leg wound is too dry. Will use aquacel with 4x4, change daily. WBAT LLE 7. Fluids/Electrolytes/Nutrition:  Strict I/O with 1200 cc FR. Monitor intake. Continue supplements/vitamins.  8. Kleb/Proteus/Enterococcus wound infection: Cefazolin for 2 weeks completed 9.  T2DM: Hgb A1c-7.9 08/27/22 -Monitor BS ac/hs and use SSI for elevated BS. Titrate basal insulin as indicated.  -6/28 she is on Semglee 40 units in the morning and 20 units at bedtime.  Increase bedtime Semglee to 22 units --7/4 decrease Semglee to 38 units in AM, 16 in afternoon due to hypoglycemia event -7/5 Cbgs controlled, continue to monitor  -11/29/22 another hypoglycemic event this morning, mostly <200 during day, will further decrease Semglee to 36U qAM + 14U qPM to see if we have fewer morning lows-- monitor -11/30/22 CBG high this morning but apparently didn't get her AM dose of insulin yesterday-- so today was the first day of the reduced dose. Will have to monitor trend. Of note, pharmacy states her dose PTA was 45U daily, not BID dosing 7/11 semglee is 36u and 18u---some improvement so far but inconsistent. Will hold on standing changes today.. SSI CBG (last 3)  Recent Labs    12/03/22 1645 12/03/22 2032 12/04/22 0651  GLUCAP 250* 261* 131*    10. CAD/biventricular CHF: On GDMT limited due to hypotension.   --Continue Lipitor 80mg  QD   -11/30/22 wt stable, monitor Filed Weights   11/30/22 0529 12/02/22 1337 12/02/22 1805  Weight: 88.1 kg 91.3 kg 86.6 kg    11. ESRD: HD TTS at the end of the day to help with tolerance of therapy.  Thrombosed AVF not salvageable.                --  has been on regular diet-->? Low salt  restrictions.   -6/28 nephrology following appreciate assistance, next HD tomorrow  -7/1 HD plans HD today due to orthopnea + SOB -7/2 patient has scheduled HD session again today, hopefully will have benefit to orthopnea although this may be anxiety related also  -7/9 volume per nephrololgy 12. Depression with anxiety d/o, panic attacks: Now on Venlafaxine XR 75mg  QD with Lorazepam at night.--lorazepam not ordered, will reorder 0.5mg  QHS PRN, changed to scheduled starting 11/23/22 as mentioned above.   13. PAF: On Eliquis and amiodarone 200mg  QD for rate control.   - HR stable in 60-70s, continue current     12/04/2022    4:06 AM 12/03/2022    7:39 PM 12/03/2022    1:09 PM  Vitals with BMI  Systolic 142 128 161  Diastolic 66 50 68  Pulse 66 69 70    14. Anemia of chronic disease:  -11/22/22 Hgb stable at 8.2 today, monitor labs  -7/2 nephrology ordering Aranesp 15.   H/o GIB due to stercoral ulcer: Avoid constipation. Continue Protonix 40mg  BID, miralax QD , Senokot 2 tabs BID, and PRNs.   -11/23/22 last BM yesterday, monitor  -7/1 Small BM yesterday, increase miralax to BID for constipation   -7/2 last BM yesterday although small, continue sorbitol as needed  -7/3 sorbitol 45ml ordered  -7/5 LBM today, brown, changed miralax from BID to daily  -12/03/22   LBM   16. Morbid Obesity. Dietary education  -Body mass index is 31.77 kg/m. 17.  Hypotension  -Taking midodrine 10mg  prior to dialysis  -6/29-30/24 BPs stable, monitor -7/2 consider more regular treatment of midodrine if orthostatic symptoms become limiting  -7/4-6 stable today, continue to monitor  -7/8--continue midodrine, volume mgt per renal Vitals:   12/02/22 1606 12/02/22 1630 12/02/22 1700 12/02/22 1732  BP: (!) 142/62 (!) 141/59 (!) 150/63 (!) 155/66   12/02/22 1754 12/02/22 1805 12/02/22 1907 12/02/22 1942  BP: (!) 149/64 (!) 158/61 (!) 149/61 (!) 148/60   12/03/22 0445 12/03/22 1309 12/03/22 1939 12/04/22 0406  BP:  (!) 140/57 (!) 158/68 (!) 128/50 (!) 142/66    18. Hypothyroidism: continue synthroid qAM 19. Oxygen dependence/chronic respiratory failure: pt states she uses PRN O2 at home, mostly related to anxiety; ordered O2 2L via [place] to maintain SpO2 >90%. Pt with clear lung sounds, doubt need for emergent work up today. Monitor  -Okay to trial increasing time off nasal cannula O2, provide reassurance  -off O2 as tolerated, anxiety component   LOS: 14 days A FACE TO FACE EVALUATION WAS PERFORMED  Ranelle Oyster 12/04/2022, 9:16 AM

## 2022-12-04 NOTE — Progress Notes (Signed)
Patient arrieved from dialysis at 1840. Denies pain or discomfort at this time. Dietary called, food ordered. Food to arrive  at [employer]. Medication times changed to reflect when meal is to arrive.

## 2022-12-04 NOTE — Progress Notes (Signed)
Occupational Therapy Session Note  Patient Details  Name: Susan [name] MRN: [id number] Date of Birth: 1951/03/18  Today's Date: 12/04/2022 OT Individual Time: 0900-1015 OT Individual Time Calculation (min): 75 min    Short Term Goals: Week 2:  OT Short Term Goal 1 (Week 2): Pt will don LB garments with Mod A. OT Short Term Goal 1 - Progress (Week 2): Progressing toward goal OT Short Term Goal 2 (Week 2): Pt will perform UB grooming/hygiene at sink-side with setup A. OT Short Term Goal 3 (Week 2): Pt will perform slide board transfer with +1 Mod A. OT Short Term Goal 3 - Progress (Week 2): Progressing toward goal  Skilled Therapeutic Interventions/Progress Updates:   Pt seen for skilled OT session this am. Pt bed level upon OT arrival. Pt open to self care and hygiene, including R residual limb with new lotion application, handling for shrinker donning and R prosthetic training. Bed level LH sponge and reacher training for bathing and dressing LB bed level. Doffed and donned R shrinker with min A and increased time and effort. Once EOB, pt open to donning R prosthesis components with cues and max A. Lateral sqat pivot transfer with max-mod A to R side to [facility] for dry run and then to w/c. Pt then doffed R prosthetic with mod A. Sink side UB self care and grooming with set up. OT demonstrated steps of shower training and DME options as well as TTB method for home via stall shower visual demo and tub room visual demo. Pt considering agreeing for shower while here on CIR and will update OT tomorrow with decision. Pt left w/c level with all needs, nurse call button and safety measures in place.    Pain: 5/10 L LE with gentle lotion application to L foot and training with LH sponge for reach for bathing   Therapy Documentation Precautions:  Precautions Precautions: Fall Precaution Comments: L lateral lower leg open wound, R rotator cuff tears, Restrictions Weight Bearing Restrictions:  Yes LLE Weight Bearing: Weight bearing as tolerated    Therapy/Group: Individual Therapy  Vicenta Dunning 12/04/2022, 4:15 PM

## 2022-12-05 LAB — GLUCOSE, CAPILLARY
Glucose-Capillary: 195 mg/dL — ABNORMAL HIGH (ref 70–99)
Glucose-Capillary: 79 mg/dL (ref 70–99)
Glucose-Capillary: 162 mg/dL — ABNORMAL HIGH (ref 70–99)
Glucose-Capillary: 361 mg/dL — ABNORMAL HIGH (ref 70–99)

## 2022-12-05 MED ORDER — INSULIN GLARGINE-YFGN 100 UNIT/ML ~~LOC~~ SOLN
20.0000 [IU] | Freq: Every day | SUBCUTANEOUS | Status: DC
  Administered 2022-12-05 – 2022-12-07 (×3): 20 [IU] via SUBCUTANEOUS
  Filled 2022-12-05 (×4): qty 0.2

## 2022-12-05 MED ORDER — INSULIN GLARGINE-YFGN 100 UNIT/ML ~~LOC~~ SOLN
40.0000 [IU] | Freq: Every day | SUBCUTANEOUS | Status: DC
  Administered 2022-12-06 – 2022-12-12 (×7): 40 [IU] via SUBCUTANEOUS
  Filled 2022-12-05 (×7): qty 0.4

## 2022-12-05 NOTE — Progress Notes (Signed)
PROGRESS NOTE   Subjective/Complaints:  Pt slept better last night. Woke up with nausea however, doesn't feel well. Denies fever/chills.   ROS: Patient denies fever, rash, sore throat, blurred vision, dizziness,  diarrhea, cough, shortness of breath or chest pain, joint or back/neck pain, headache, or mood change.    Objective:   No results found. Recent Labs    12/02/22 1358  WBC 7.0  HGB 9.7*  HCT 31.2*  PLT 250    Recent Labs    12/02/22 1358  NA 129*  K 4.2  CL 93*  CO2 24  GLUCOSE 290*  BUN 83*  CREATININE 5.54*  CALCIUM 8.7*     Intake/Output Summary (Last 24 hours) at 12/05/2022 0914 Last data filed at 12/05/2022 0804 Gross per 24 hour  Intake 315 ml  Output 3000 ml  Net -2685 ml     Pressure Injury 08/27/22 Coccyx Mid Stage 2 -  Partial thickness loss of dermis presenting as a shallow open injury with a red, pink wound bed without slough. 53mmx5mmx0 red center unattached edges (Active)  08/27/22 1000  Location: Coccyx  Location Orientation: Mid  Staging: Stage 2 -  Partial thickness loss of dermis presenting as a shallow open injury with a red, pink wound bed without slough.  Wound Description (Comments): 55mmx5mmx0 red center unattached edges  Present on Admission:     Physical Exam: Vital Signs Blood pressure (!) 132/55, pulse 73, temperature 97.8 F (36.6 C), resp. rate 18, height 5\' 5"  (1.651 m), weight 85.3 kg, SpO2 99%.  Constitutional: No distress . Vital signs reviewed. HEENT: NCAT, EOMI, oral membranes moist Neck: supple Cardiovascular: RRR without murmur. No JVD    Respiratory/Chest: CTA Bilaterally without wheezes or rales. Normal effort    GI/Abdomen: BS scant but present, non-tender, non-distended Ext: no clubbing, cyanosis, or edema Psych: anxious but pleasant and cooperative  Skin:  + Right BKA wound essentially healed.   Left calf wound  is pink and granulating Coccyx,  buttock wounds dressed    Neurologic exam: Alert and awake, decreased sensation below distal thighs, cranial nerves II through XII grossly intact Right IJ TDC in place MSK: No abnormal tone noted      + right shoulder rom still somewhat limited            Assessment/Plan: 1. Functional deficits which require 3+ hours per day of interdisciplinary therapy in a comprehensive inpatient rehab setting. Physiatrist is providing close team supervision and 24 hour management of active medical problems listed below. Physiatrist and rehab team continue to assess barriers to discharge/monitor patient progress toward functional and medical goals  Care Tool:  Bathing    Body parts bathed by patient: Right arm, Left arm, Chest, Face   Body parts bathed by helper: Buttocks, Front perineal area, Abdomen, Right upper leg, Left upper leg Body parts n/a: Right lower leg, Left lower leg   Bathing assist Assist Level: Dependent - Patient 0%     Upper Body Dressing/Undressing Upper body dressing   What is the patient wearing?: Hospital gown only    Upper body assist Assist Level: Maximal Assistance - Patient 25 - 49%    Lower Body  Dressing/Undressing Lower body dressing      What is the patient wearing?: Incontinence brief, Hospital gown only, Ace wrap/stump shrinker     Lower body assist Assist for lower body dressing: Dependent - Patient 0%     Toileting Toileting    Toileting assist Assist for toileting: Total Assistance - Patient < 25%     Transfers Chair/bed transfer  Transfers assist  Chair/bed transfer activity did not occur: Safety/medical concerns  Chair/bed transfer assist level: Dependent - mechanical lift     Locomotion Ambulation   Ambulation assist   Ambulation activity did not occur: Safety/medical concerns          Walk 10 feet activity   Assist  Walk 10 feet activity did not occur: Safety/medical concerns        Walk 50 feet  activity   Assist Walk 50 feet with 2 turns activity did not occur: Safety/medical concerns         Walk 150 feet activity   Assist Walk 150 feet activity did not occur: Safety/medical concerns         Walk 10 feet on uneven surface  activity   Assist Walk 10 feet on uneven surfaces activity did not occur: Safety/medical concerns         Wheelchair     Assist Is the patient using a wheelchair?: Yes Type of Wheelchair: Manual    Wheelchair assist level: Dependent - Patient 0% Max wheelchair distance: 10 ft    Wheelchair 50 feet with 2 turns activity    Assist        Assist Level: Dependent - Patient 0%   Wheelchair 150 feet activity     Assist      Assist Level: Dependent - Patient 0%   Blood pressure (!) 132/55, pulse 73, temperature 97.8 F (36.6 C), resp. rate 18, height 5\' 5"  (1.651 m), weight 85.3 kg, SpO2 99%.  Medical Problem List and Plan: 1. 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  Antithrombotics: -DVT/anticoagulation:  Pharmaceutical: Eliquis 5mg  BID             -antiplatelet therapy: N/A 3. Pain Management:  Tylenol, Robaxin, and Oxycodone prn.  -Patient had altered mental status and twitching suspected due to Lyrica toxicity prior to admission to CIR.  Lyrica was discontinued. -7/2 patient has been avoiding trying oxycodone due to constipation, discussed that we can give additional laxatives with this if needed.  We could try low-dose of Lyrica for phantom pain.  She is currently on Effexor. -7/3 will adjust oxycodone to 2.5 to 5 mg PRN -7/4 doing better with 2.5 oxycodone and robaxin PRN, she reports minimal benefit to phantom pain with gabapentin/lyrica in the past -7/5 continue oxycodone 2.5 mg as needed and Robaxin as needed 7/8   Robaxin --increased to 750mg  q6 prn---continue 4. Mood/Behavior/Sleep/Anxiety disorder: LCSW to follow for evaluation and support.  --chronic insomnia--has Ambien prn w/ Ramelteron 8mg  QHS, and Lorazepam. -continue melatonin 7/12-continue klonopin 0.5mg  bid. Slept better last night             -antipsychotic agents: N/A 5. Neuropsych/cognition: This patient is capable of making decisions on her own behalf.   6. Skin/Wound Care: Monitor  wound for healing.  --Wound VAC d/c due to concerns of wound getting macerated per Dr. Lajoyce Corners.  --Dry dressing changes and to add silvadene to wound bed if wound starts getting dry.  -6/28 VAC was removed last night Dr. Lajoyce Corners advises to "Apply small amount of silvadine pulse gauze and ace wrap. Change daily " -11/22/22 yellowish slough/drainage from wound bed, contacted Dr. Lajoyce Corners regarding this, will see if he wants BID dressing changes-- still pending response. Will f/up tomorrow. For now, doesn't look like worsening infection, can monitor closely. Will undress wound and re-photograph tomorrow -11/23/22 wound looks about the same or maybe a little better today, Dr. Lajoyce Corners apparently out of town per patient, will change to BID silvadene and dressing changes for now, to have closer eyes on wound, but may want to touch base with Dr. Lajoyce Corners this week to check if he would like to adjust anything.  -7/3 PA with ortho evaluated her wound today, recommended just dry dressings for now, appreciate assistance -7/12 changed left leg wound dressing to daily aquacel with 4x4 on 7/11.  WBAT LLE 7. Fluids/Electrolytes/Nutrition:  Strict I/O with 1200 cc FR. Monitor intake. Continue supplements/vitamins.  8. Kleb/Proteus/Enterococcus wound infection: Cefazolin for 2 weeks completed 9.  T2DM: Hgb A1c-7.9 08/27/22 -Monitor BS ac/hs and use SSI for elevated BS. Titrate basal insulin as indicated.  -6/28 she is on Semglee 40 units in the morning and 20 units at bedtime.  Increase bedtime Semglee  to 22 units --7/4 decrease Semglee to 38 units in AM, 16 in afternoon due to hypoglycemia event -7/5 Cbgs controlled, continue to monitor  -11/29/22 another hypoglycemic event this morning, mostly <200 during day, will further decrease Semglee to 36U qAM + 14U qPM to see if we have fewer morning lows-- monitor -11/30/22 CBG high this morning but apparently didn't get her AM dose of insulin yesterday-- so today was the first day of the reduced dose. Will have to monitor trend. Of note, pharmacy states her dose PTA was 45U daily, not BID dosing 7/12 semglee is 38u and 18u---persistent elevation  -adjust to 40 and 20 u today CBG (last 3)  Recent Labs    12/04/22 1837 12/04/22 2108 12/05/22 0621  GLUCAP 156* 269* 195*    10. CAD/biventricular CHF: On GDMT limited due to hypotension.   --Continue Lipitor 80mg  QD   -11/30/22 wt stable, monitor Filed Weights   12/02/22 1805 12/04/22 1335 12/04/22 1750  Weight: 86.6 kg 88.7 kg 85.3 kg    11. ESRD: HD TTS at the end of the day to help with tolerance of therapy.  Thrombosed AVF not salvageable.                --has been on regular diet-->? Low salt restrictions.   -6/28 nephrology following appreciate assistance, next HD tomorrow  -7/1 HD plans HD today due to orthopnea + SOB -7/2 patient has scheduled HD session again today, hopefully will have benefit to orthopnea although this may be anxiety related also  -7/12 volume mgt per nephrology  12. Depression with anxiety d/o, panic attacks: Now on Venlafaxine XR 75mg  QD with Lorazepam at night.--lorazepam not ordered, will reorder 0.5mg  QHS PRN, changed to scheduled starting 11/23/22 as mentioned above.   13. PAF: On Eliquis and amiodarone 200mg  QD for rate control.   - HR stable in 60-70s, continue current     12/05/2022    5:37 AM 12/04/2022    8:26 PM 12/04/2022    6:42 PM  Vitals with  BMI  Systolic 132 135 161  Diastolic 55 53 61  Pulse 73 73 64    14. Anemia of chronic disease:  -11/22/22  Hgb stable at 8.2 today, monitor labs  -7/2 nephrology ordering Aranesp 15.   H/o GIB due to stercoral ulcer: Avoid constipation. Continue Protonix 40mg  BID, miralax QD , Senokot 2 tabs BID, and PRNs.   -11/23/22 last BM yesterday, monitor  -7/1 Small BM yesterday, increase miralax to BID for constipation   -7/2 last BM yesterday although small, continue sorbitol as needed  -7/3 sorbitol 45ml ordered  -7/5 LBM today, brown, changed miralax from BID to daily  -12/04/22   LBM   16. Morbid Obesity. Dietary education  -Body mass index is 31.29 kg/m. 17.  Hypotension  -Taking midodrine 10mg  prior to dialysis  -6/29-30/24 BPs stable, monitor -7/2 consider more regular treatment of midodrine if orthostatic symptoms become limiting  -7/4-6 stable today, continue to monitor  -7/12--continue midodrine, volume mgt per renal Vitals:   12/04/22 1430 12/04/22 1500 12/04/22 1530 12/04/22 1600  BP: (!) 138/53 (!) 141/52 133/68 (!) 142/60   12/04/22 1630 12/04/22 1700 12/04/22 1730 12/04/22 1749  BP: 138/63 130/65 135/70 (!) 122/54   12/04/22 1750 12/04/22 1842 12/04/22 2026 12/05/22 0537  BP: (!) 130/56 132/61 (!) 135/53 (!) 132/55    18. Hypothyroidism: continue synthroid qAM 19. Oxygen dependence/chronic respiratory failure: pt states she uses PRN O2 at home, mostly related to anxiety; ordered O2 2L via [place] to maintain SpO2 >90%. Pt with clear lung sounds, doubt need for emergent work up today. Monitor  -Okay to trial increasing time off nasal cannula O2, provide reassurance  -off O2 as tolerated, anxiety component  20. Nausea: unclear etiology -7/12  -has reglan and compazine prn  -moved bowels yesterday  -abdomen benign appearing  -amiodarone effect?  -anxiety related?  -monitor today  LOS: 15 days A FACE TO FACE EVALUATION WAS PERFORMED  Ranelle Oyster 12/05/2022, 9:14 AM

## 2022-12-05 NOTE — Progress Notes (Signed)
Physical Therapy Session Note  Patient Details  Name: Susan [name] MRN: [id number] Date of Birth: Mar 03, 1951  Today's Date: 12/05/2022 PT Missed Time: 60 Minutes Missed Time Reason: Patient ill (Comment) (pt feeling queasy and weak after dialysis)  Short Term Goals: Week 2:  PT Short Term Goal 1 (Week 2): Pt will initiate sit<>stand with R LE prosthetic with LRAD and +2 max A PT Short Term Goal 2 (Week 2): Pt will initiate squat pivot transfer for pt transfer into [employer] 4 runner  Skilled Therapeutic Interventions/Progress Updates:    Pt recd in bed, reporting that she feels ill and queasy after her dialysis yesterday. Reports this has happened before, but much worse this time. Pt states she is unable to participate in therapy at this time. Denies toileting, repositioning, or any mobility d/t feeling ill. Pt missed x60 min of scheduled therapy d/t illness, will make up as able.   Therapy Documentation Precautions:  Precautions Precautions: Fall Precaution Comments: L lateral lower leg open wound, R rotator cuff tears, Restrictions Weight Bearing Restrictions: Yes LLE Weight Bearing: Weight bearing as tolerated General: PT Amount of Missed Time (min): 60 Minutes PT Missed Treatment Reason: Patient ill (Comment) (pt feeling queasy and weak after dialysis)     Therapy/Group: Individual Therapy  Juluis Rainier 12/05/2022, 8:26 AM

## 2022-12-05 NOTE — Plan of Care (Signed)
Progressing towards goals

## 2022-12-05 NOTE — Progress Notes (Signed)
Occupational Therapy Session Note  Patient Details  Name: Susan [name] MRN: [id number] Date of Birth: 05/27/1950  Today's Date: 12/05/2022 OT Missed Time: 70 Minutes Missed Time Reason: Patient ill (comment) (Nausea and fatigue)  Today's Date: 12/05/2022 OT Missed Time: 60 Minutes Missed Time Reason: Patient ill (comment)  Short Term Goals: Week 3:  OT Short Term Goal 1 (Week 3): STG=LTG based on LOS  Skilled Therapeutic Interventions/Progress Updates:  Session 1: Pt received resting in bed for skilled OT session. Pt with reports of nausea and generalized fatigue, politely declining participation in session despite encouragement provided by OT. Pt misses ~70 minutes of skilled OT intervention to be made up as appropriate.    Session 2: Pt received resting in bed for skilled OT session. Pt with reports of nausea and generalized fatigue, politely declining participation in session despite encouragement provided by OT. Pt misses ~60 minutes of skilled OT intervention to be made up as appropriate.    Therapy Documentation Precautions:  Precautions Precautions: Fall Precaution Comments: L lateral lower leg open wound, R rotator cuff tears, Restrictions Weight Bearing Restrictions: Yes LLE Weight Bearing: Weight bearing as tolerated   Therapy/Group: Individual Therapy  Lou Cal, OTR/L, MSOT  12/05/2022, 5:22 AM

## 2022-12-05 NOTE — Progress Notes (Signed)
Patient continues to feel nauseous. Encouraged crackers and diet gingerale, declines at this time. Wants to wait a bit longer and try a few bites off her lunch tray. Dan A. PA notified of patient not feeling well today, now new orders at this time.

## 2022-12-06 LAB — RENAL FUNCTION PANEL
Albumin: 2.9 g/dL — ABNORMAL LOW (ref 3.5–5.0)
Anion gap: 11 (ref 5–15)
BUN: 70 mg/dL — ABNORMAL HIGH (ref 8–23)
CO2: 26 mmol/L (ref 22–32)
Calcium: 9.1 mg/dL (ref 8.9–10.3)
Chloride: 95 mmol/L — ABNORMAL LOW (ref 98–111)
Creatinine, Ser: 4.85 mg/dL — ABNORMAL HIGH (ref 0.44–1.00)
GFR, Estimated: 9 mL/min — ABNORMAL LOW (ref >60)
Glucose, Bld: 273 mg/dL — ABNORMAL HIGH (ref 70–99)
Phosphorus: 4.1 mg/dL (ref 2.5–4.6)
Potassium: 3.7 mmol/L (ref 3.5–5.1)
Sodium: 132 mmol/L — ABNORMAL LOW (ref 135–145)

## 2022-12-06 LAB — CBC
HCT: 34.7 % — ABNORMAL LOW (ref 36.0–46.0)
Hemoglobin: 10.4 g/dL — ABNORMAL LOW (ref 12.0–15.0)
MCH: 23.7 pg — ABNORMAL LOW (ref 26.0–34.0)
MCHC: 30 g/dL (ref 30.0–36.0)
MCV: 79.2 fL — ABNORMAL LOW (ref 80.0–100.0)
Platelets: 275 10*3/uL (ref 150–400)
RBC: 4.38 MIL/uL (ref 3.87–5.11)
RDW: 17.4 % — ABNORMAL HIGH (ref 11.5–15.5)
WBC: 6.8 10*3/uL (ref 4.0–10.5)
nRBC: 0 % (ref 0.0–0.2)

## 2022-12-06 LAB — GLUCOSE, CAPILLARY
Glucose-Capillary: 252 mg/dL — ABNORMAL HIGH (ref 70–99)
Glucose-Capillary: 229 mg/dL — ABNORMAL HIGH (ref 70–99)
Glucose-Capillary: 177 mg/dL — ABNORMAL HIGH (ref 70–99)
Glucose-Capillary: 175 mg/dL — ABNORMAL HIGH (ref 70–99)

## 2022-12-06 NOTE — Progress Notes (Signed)
   12/06/22 1915  Vitals  Temp 98.6 F (37 C)  Temp Source Oral  BP (!) 161/61  BP Location Right Arm  BP Method Automatic  During Treatment Monitoring  Intra-Hemodialysis Comments Tx completed  Post Treatment  Dialyzer Clearance Lightly streaked  Duration of HD Treatment -hour(s) 4 hour(s)  Hemodialysis Intake (mL) 0 mL  Liters Processed 78  Fluid Removed (mL) 3000 mL  Tolerated HD Treatment Yes  Post-Hemodialysis Comments Pt goal met.  Hemodialysis Catheter Right Subclavian  Placement Date: 06/06/22   Placed prior to admission: Yes  Orientation: Right  Access Location: Subclavian  Site Condition No complications  Blue Lumen Status Heparin locked  Red Lumen Status Heparin locked  Purple Lumen Status N/A  Catheter fill solution Heparin 1000 units/ml  Catheter fill volume (Arterial) 1.9 cc  Catheter fill volume (Venous) 1.9  Dressing Type Transparent  Dressing Status Antimicrobial disc in place  Interventions New dressing  Drainage Description None  Post treatment catheter status Capped and Clamped

## 2022-12-06 NOTE — Progress Notes (Signed)
PROGRESS NOTE   Subjective/Complaints:  Nausea finally resolved by late in day yesterday. Slept better and feels pretty well today.   ROS: Patient denies fever, rash, sore throat, blurred vision, dizziness, nausea, vomiting, diarrhea, cough, shortness of breath or chest pain,   headache    Objective:   No results found. No results for input(s): "WBC", "HGB", "HCT", "PLT" in the last 72 hours.   No results for input(s): "NA", "K", "CL", "CO2", "GLUCOSE", "BUN", "CREATININE", "CALCIUM" in the last 72 hours.    Intake/Output Summary (Last 24 hours) at 12/06/2022 1157 Last data filed at 12/06/2022 0757 Gross per 24 hour  Intake 360 ml  Output --  Net 360 ml     Pressure Injury 08/27/22 Coccyx Mid Stage 2 -  Partial thickness loss of dermis presenting as a shallow open injury with a red, pink wound bed without slough. 61mmx5mmx0 red center unattached edges (Active)  08/27/22 1000  Location: Coccyx  Location Orientation: Mid  Staging: Stage 2 -  Partial thickness loss of dermis presenting as a shallow open injury with a red, pink wound bed without slough.  Wound Description (Comments): 5mmx5mmx0 red center unattached edges  Present on Admission:     Physical Exam: Vital Signs Blood pressure (!) 146/61, pulse 70, temperature 97.7 F (36.5 C), temperature source Oral, resp. rate 18, height 5\' 5"  (1.651 m), weight 85.3 kg, SpO2 100%.  Constitutional: No distress . Vital signs reviewed. HEENT: NCAT, EOMI, oral membranes moist Neck: supple Cardiovascular: RRR without murmur. No JVD    Respiratory/Chest: CTA Bilaterally without wheezes or rales. Normal effort    GI/Abdomen: BS +, non-tender, non-distended Ext: no clubbing, cyanosis, or edema Psych: anxious but pleasant and cooperative  Skin:  + Right BKA wound intact.   Left calf wound  is pink and granulating Coccyx, buttock wounds dressed    Neurologic exam: Alert and  awake, decreased sensation below distal thighs, cranial nerves II through XII grossly intact Right IJ TDC in place MSK: No abnormal tone noted      + right shoulder rom still somewhat limited            Assessment/Plan: 1. Functional deficits which require 3+ hours per day of interdisciplinary therapy in a comprehensive inpatient rehab setting. Physiatrist is providing close team supervision and 24 hour management of active medical problems listed below. Physiatrist and rehab team continue to assess barriers to discharge/monitor patient progress toward functional and medical goals  Care Tool:  Bathing    Body parts bathed by patient: Right arm, Left arm, Chest, Face   Body parts bathed by helper: Buttocks, Front perineal area, Abdomen, Right upper leg, Left upper leg Body parts n/a: Right lower leg, Left lower leg   Bathing assist Assist Level: Dependent - Patient 0%     Upper Body Dressing/Undressing Upper body dressing   What is the patient wearing?: Hospital gown only    Upper body assist Assist Level: Maximal Assistance - Patient 25 - 49%    Lower Body Dressing/Undressing Lower body dressing      What is the patient wearing?: Incontinence brief, Hospital gown only, Ace wrap/stump shrinker     Lower  body assist Assist for lower body dressing: Dependent - Patient 0%     Toileting Toileting    Toileting assist Assist for toileting: Total Assistance - Patient < 25%     Transfers Chair/bed transfer  Transfers assist  Chair/bed transfer activity did not occur: Safety/medical concerns  Chair/bed transfer assist level: Dependent - mechanical lift     Locomotion Ambulation   Ambulation assist   Ambulation activity did not occur: Safety/medical concerns          Walk 10 feet activity   Assist  Walk 10 feet activity did not occur: Safety/medical concerns        Walk 50 feet activity   Assist Walk 50 feet with 2 turns activity did not occur:  Safety/medical concerns         Walk 150 feet activity   Assist Walk 150 feet activity did not occur: Safety/medical concerns         Walk 10 feet on uneven surface  activity   Assist Walk 10 feet on uneven surfaces activity did not occur: Safety/medical concerns         Wheelchair     Assist Is the patient using a wheelchair?: Yes Type of Wheelchair: Manual    Wheelchair assist level: Dependent - Patient 0% Max wheelchair distance: 10 ft    Wheelchair 50 feet with 2 turns activity    Assist        Assist Level: Dependent - Patient 0%   Wheelchair 150 feet activity     Assist      Assist Level: Dependent - Patient 0%   Blood pressure (!) 146/61, pulse 70, temperature 97.7 F (36.5 C), temperature source Oral, resp. rate 18, height 5\' 5"  (1.651 m), weight 85.3 kg, SpO2 100%.  Medical Problem List and Plan: 1. 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  Antithrombotics: -DVT/anticoagulation:  Pharmaceutical: Eliquis 5mg  BID             -antiplatelet therapy: N/A 3. Pain Management:  Tylenol, Robaxin, and Oxycodone prn.  -Patient had altered mental status and twitching suspected due to Lyrica toxicity prior to admission to CIR.  Lyrica was discontinued. -7/2 patient has been avoiding trying oxycodone due to constipation, discussed that we can give additional laxatives with this if needed.  We could try low-dose of Lyrica for phantom pain.  She is currently on Effexor. -7/3 will adjust oxycodone to 2.5 to 5 mg PRN -7/4 doing better with 2.5 oxycodone and robaxin PRN, she reports minimal benefit to phantom pain with gabapentin/lyrica in the past -7/5 continue oxycodone 2.5 mg as needed and Robaxin as needed 7/8  Robaxin --increased to 750mg  q6 prn---continue 4.  Mood/Behavior/Sleep/Anxiety disorder: LCSW to follow for evaluation and support.  --chronic insomnia--has Ambien prn w/ Ramelteron 8mg  QHS, and Lorazepam. -continue melatonin 7/13-continue klonopin 0.5mg  bid. Seems to be sleeping better, anxiety during the day a bit better as well             -antipsychotic agents: N/A 5. Neuropsych/cognition: This patient is capable of making decisions on her own behalf.   6. Skin/Wound Care: Monitor wound for healing.  --Wound VAC d/c due to concerns of wound getting macerated per  Dr. Lajoyce Corners.  --Dry dressing changes and to add silvadene to wound bed if wound starts getting dry.  -6/28 VAC was removed last night Dr. Lajoyce Corners advises to "Apply small amount of silvadine pulse gauze and ace wrap. Change daily " -11/22/22 yellowish slough/drainage from wound bed, contacted Dr. Lajoyce Corners regarding this, will see if he wants BID dressing changes-- still pending response. Will f/up tomorrow. For now, doesn't look like worsening infection, can monitor closely. Will undress wound and re-photograph tomorrow -11/23/22 wound looks about the same or maybe a little better today, Dr. Lajoyce Corners apparently out of town per patient, will change to BID silvadene and dressing changes for now, to have closer eyes on wound, but may want to touch base with Dr. Lajoyce Corners this week to check if he would like to adjust anything.  -7/3 PA with ortho evaluated her wound today, recommended just dry dressings for now, appreciate assistance -7/13 continue left leg wound dressing with aquacel with 4x4 on 7/11.  WBAT LLE 7. Fluids/Electrolytes/Nutrition:  Strict I/O with 1200 cc FR. Monitor intake. Continue supplements/vitamins.  8. Kleb/Proteus/Enterococcus wound infection: Cefazolin for 2 weeks completed 9.  T2DM: Hgb A1c-7.9 08/27/22 -Monitor BS ac/hs and use SSI for elevated BS. Titrate basal insulin as indicated.  -6/28 she is on Semglee 40 units in the morning and 20 units at bedtime.  Increase bedtime Semglee to 22  units --7/4 decrease Semglee to 38 units in AM, 16 in afternoon due to hypoglycemia event -7/5 Cbgs controlled, continue to monitor  -11/29/22 another hypoglycemic event this morning, mostly <200 during day, will further decrease Semglee to 36U qAM + 14U qPM to see if we have fewer morning lows-- monitor -11/30/22 CBG high this morning but apparently didn't get her AM dose of insulin yesterday-- so today was the first day of the reduced dose. Will have to monitor trend. Of note, pharmacy states her dose PTA was 45U daily, not BID dosing 7/12 semglee is 38u and 18u---persistent elevation  -adjust to 40 and 20 u today CBG (last 3)  Recent Labs    12/05/22 1621 12/05/22 2112 12/06/22 0611  GLUCAP 162* 361* 252*   7/13--observe numbers today  10. CAD/biventricular CHF: On GDMT limited due to hypotension.   --Continue Lipitor 80mg  QD   -11/30/22 wt stable, monitor Filed Weights   12/02/22 1805 12/04/22 1335 12/04/22 1750  Weight: 86.6 kg 88.7 kg 85.3 kg    11. ESRD: HD TTS at the end of the day to help with tolerance of therapy.  Thrombosed AVF not salvageable.                --has been on regular diet-->? Low salt restrictions.   -6/28 nephrology following appreciate assistance, next HD tomorrow  -7/1 HD plans HD today due to orthopnea + SOB -7/2 patient has scheduled HD session again today, hopefully will have benefit to orthopnea although this may be anxiety related also  -7/12 volume mgt per nephrology  12. Depression with anxiety d/o, panic attacks: Now on Venlafaxine XR 75mg  QD with Lorazepam at night.--lorazepam not ordered, will reorder 0.5mg  QHS PRN, changed to scheduled starting 11/23/22 as mentioned above.   13. PAF: On Eliquis and amiodarone 200mg  QD for rate control.   - HR stable in 60-70s, continue current     12/06/2022    6:18 AM 12/05/2022    7:31 PM 12/05/2022    2:36 PM  Vitals with BMI  Systolic 146 136 161  Diastolic 61 46 47  Pulse  70 70 74    14. Anemia of  chronic disease:  -11/22/22 Hgb stable at 8.2 today, monitor labs  -7/2 nephrology ordering Aranesp 15.   H/o GIB due to stercoral ulcer: Avoid constipation. Continue Protonix 40mg  BID, miralax QD , Senokot 2 tabs BID, and PRNs.   -11/23/22 last BM yesterday, monitor  -7/1 Small BM yesterday, increase miralax to BID for constipation   -7/2 last BM yesterday although small, continue sorbitol as needed  -7/3 sorbitol 45ml ordered  -7/5 LBM today, brown, changed miralax from BID to daily  -12/04/22   LBM   16. Morbid Obesity. Dietary education  -Body mass index is 31.29 kg/m. 17.  Hypotension  -Taking midodrine 10mg  prior to dialysis  -6/29-30/24 BPs stable, monitor -7/2 consider more regular treatment of midodrine if orthostatic symptoms become limiting  -7/4-6 stable today, continue to monitor  -7/12--continue midodrine, volume mgt per renal Vitals:   12/04/22 1600 12/04/22 1630 12/04/22 1700 12/04/22 1730  BP: (!) 142/60 138/63 130/65 135/70   12/04/22 1749 12/04/22 1750 12/04/22 1842 12/04/22 2026  BP: (!) 122/54 (!) 130/56 132/61 (!) 135/53   12/05/22 0537 12/05/22 1436 12/05/22 1931 12/06/22 0618  BP: (!) 132/55 (!) 120/47 (!) 136/46 (!) 146/61    18. Hypothyroidism: continue synthroid qAM 19. Oxygen dependence/chronic respiratory failure: pt states she uses PRN O2 at home, mostly related to anxiety; ordered O2 2L via [place] to maintain SpO2 >90%. Pt with clear lung sounds, doubt need for emergent work up today. Monitor  -Okay to trial increasing time off nasal cannula O2, provide reassurance  -off O2 as tolerated, anxiety component  20. Nausea: unclear etiology -resolved as of 7/13  -has reglan and compazine prn  -moving bowel fairly regularly  -abdomen benign appearing  -amiodarone effect?  -anxiety related?  -continue to follow  LOS: 16 days A FACE TO FACE EVALUATION WAS PERFORMED  Ranelle Oyster 12/06/2022, 11:57 AM

## 2022-12-06 NOTE — Progress Notes (Signed)
Subjective:  no co , for hd today on schedule.  Objective Vital signs in last 24 hours: Vitals:   12/05/22 0537 12/05/22 1436 12/05/22 1931 12/06/22 0618  BP: (!) 132/55 (!) 120/47 (!) 136/46 (!) 146/61  Pulse: 73 74 70 70  Resp: 18 18 17 18   Temp: 97.8 F (36.6 C) 98.3 F (36.8 C) 97.7 F (36.5 C) 97.7 F (36.5 C)  TempSrc:    Oral  SpO2: 99% 99% 100% 100%  Weight:      Height:       Weight change:   Physical Exam: General: alert , obese chronically illl elderly female NAD Heart: RRR, no mrg Lungs: CTA  ant. [place] O2 ,non labored breathing  Abdomen: Obese NABS, soft , nt, nd Extremities: R BKA  , Left L extem dressing dry Brigid Re /bilat hip edema Access: RIJ tdc  dressing dry clean     OP HD=[facility] TTS  4hr, 400/800, EDW 91.5kg, 2K/2Ca, TDC, heparin 4000U bolus  - Mircera 75 mcg IV q 2 weeks  - last 6/8 - Calcitriol 0.5 three times per week    Problem/Plan: LLE wound infection: B Cx neg. S/p OR debridement/wound vac on 6/21. Wound Cx grew Proteus + Klebsiella + Enterococcus:  IVCefazolin q HD dose has been completed . ESRD: Usual TTS schedule. "Extra" HD 7/1 due to dyspnea/orthopnea, then back to usual schedule. Tolerated HD Thur with 4L net UF w/o cramping but on 7/6 3L net UF with cramping. She is likely at her EDW. Next HD today  Thrombosed AVF - not salvageable, TDC in use. For new access in near future, already has appt. Hypotension/volume: BP 133/51 - gets midodrine pre-HD. Has dense thigh edema. Keep challenging, needs lower EDW on discharge.  Anemia of ESRD: Hgb 9.7 - continue Aranesp weekly while here ( given 7/3).  Metabolic bone disease: CorrCa up slightly, continue Renvela as binder. VDRA on hold.   Nutrition: Alb low, continue supps. AFib - on amiodarone/Eliquis  T2DM - historically poorly controlled. Insulin per primary AMS/Twitching - felt secondary to Lyrica toxicity in ESRD. Resolved after discontinuing medication.  Dispo: In CIR.  Lenny Pastel,  PA-C [facility] Beeper [phone] 12/06/2022,12:49 PM  LOS: 16 days   Labs: Basic Metabolic Panel: Recent Labs  Lab 12/01/22 2248 12/02/22 1358  NA  --  129*  K  --  4.2  CL  --  93*  CO2  --  24  GLUCOSE 417* 290*  BUN  --  83*  CREATININE  --  5.54*  CALCIUM  --  8.7*  PHOS  --  4.7*   Liver Function Tests: Recent Labs  Lab 12/02/22 1358  ALBUMIN 3.1*   No results for input(s): "LIPASE", "AMYLASE" in the last 168 hours. No results for input(s): "AMMONIA" in the last 168 hours. CBC: Recent Labs  Lab 12/02/22 1358  WBC 7.0  HGB 9.7*  HCT 31.2*  MCV 78.2*  PLT 250   Cardiac Enzymes: No results for input(s): "CKTOTAL", "CKMB", "CKMBINDEX", "TROPONINI" in the last 168 hours. CBG: Recent Labs  Lab 12/05/22 1142 12/05/22 1621 12/05/22 2112 12/06/22 0611 12/06/22 1200  GLUCAP 79 162* 361* 252* 229*    Studies/Results: No results found. Medications:   (feeding supplement) PROSource Plus  30 mL Oral BID BM   amiodarone  200 mg Oral Daily   apixaban  5 mg Oral BID   ascorbic acid  500 mg Oral Daily   atorvastatin  80 mg Oral Daily  Chlorhexidine Gluconate Cloth  6 each Topical BID   clonazepam  0.5 mg Oral BID   darbepoetin (ARANESP) injection - DIALYSIS  150 mcg Subcutaneous Q Wed-1800   feeding supplement (NEPRO CARB STEADY)  237 mL Oral BID BM   folic acid  1 mg Oral Daily   hydrocerin   Topical BID   insulin aspart  0-6 Units Subcutaneous TID WC   insulin glargine-yfgn  20 Units Subcutaneous QHS   insulin glargine-yfgn  40 Units Subcutaneous Daily   levothyroxine  75 mcg Oral QAC breakfast   melatonin  5 mg Oral QHS   midodrine  10 mg Oral Q T,Th,Sa-HD   multivitamin  1 tablet Oral QHS   pantoprazole  40 mg Oral BID AC   polyethylene glycol  17 g Oral Daily   ramelteon  8 mg Oral QHS   senna-docusate  2 tablet Oral BID   sevelamer carbonate  1,600 mg Oral TID WC   venlafaxine XR  75 mg Oral Q breakfast

## 2022-12-06 NOTE — Progress Notes (Signed)
Occupational Therapy Session Note  Patient Details  Name: Susan [name] MRN: [id number] Date of Birth: 03-27-1951  Today's Date: 12/06/2022 OT Individual Time: 0915-1000 OT Individual Time Calculation (min): 45 min    Short Term Goals: Week 3:  OT Short Term Goal 1 (Week 3): STG=LTG based on LOS  Skilled Therapeutic Interventions/Progress Updates:   Pt refusing OOB this am feeling nauseous and given anti-nausea earlier. Pt open to bed level therex and R LE skin inspection, R LE residual limb care and handling to desensitize fear and overall skin/site in prep for progressing prosthetic training. Pt able to doff R shrinker and perform skin inspection with hand held skin inspection mirror. Noted 3-4 small reddened regions right at shrinker seam. OT encouraged pt to apply dermaskin lotion to limb. Pt able to participate in core and B UE light therex for abdominal forward and lateral as well as B scap retraction/protraction, elbow flex/ex and forearm pronation./supination 10 reps of each x 2 sets. Cues for breathing integration with pt continually isnsiting to wear O2 via [place] despite sats trending in high 90"s on RA. This session 98% on RA when O2 removed for therex. Left pt bed level with nurse call button and needs in reach and bed exit engaged.    Pain: 7/10 L LE with repositioning and pillow prop for relief to 6/10   Therapy Documentation Precautions:  Precautions Precautions: Fall Precaution Comments: L lateral lower leg open wound, R rotator cuff tears, Restrictions Weight Bearing Restrictions: Yes LLE Weight Bearing: Weight bearing as tolerated    Therapy/Group: Individual Therapy  Vicenta Dunning 12/06/2022, 7:39 AM

## 2022-12-07 LAB — GLUCOSE, CAPILLARY
Glucose-Capillary: 234 mg/dL — ABNORMAL HIGH (ref 70–99)
Glucose-Capillary: 183 mg/dL — ABNORMAL HIGH (ref 70–99)
Glucose-Capillary: 277 mg/dL — ABNORMAL HIGH (ref 70–99)
Glucose-Capillary: 260 mg/dL — ABNORMAL HIGH (ref 70–99)

## 2022-12-07 MED ORDER — SENNOSIDES-DOCUSATE SODIUM 8.6-50 MG PO TABS
2.0000 | ORAL_TABLET | Freq: Every day | ORAL | Status: DC
  Administered 2022-12-08 – 2022-12-11 (×4): 2 via ORAL
  Filled 2022-12-07 (×4): qty 2

## 2022-12-07 MED ORDER — METOCLOPRAMIDE HCL 5 MG PO TABS
5.0000 mg | ORAL_TABLET | Freq: Three times a day (TID) | ORAL | Status: DC
  Administered 2022-12-07 – 2022-12-12 (×14): 5 mg via ORAL
  Filled 2022-12-07 (×14): qty 1

## 2022-12-07 MED ORDER — SORBITOL 70 % SOLN
30.0000 mL | Freq: Three times a day (TID) | Status: DC | PRN
  Filled 2022-12-07: qty 30

## 2022-12-07 MED ORDER — SORBITOL 70 % SOLN
30.0000 mL | Freq: Once | Status: AC
  Administered 2022-12-07: 30 mL via ORAL

## 2022-12-07 NOTE — Discharge Summary (Signed)
Physician Discharge Summary  Patient ID: Susan [name] MRN: [id number] DOB/AGE: [birth year] 72 y.o.  Admit date: 11/20/2022 Discharge date: 12/12/2022  Discharge Diagnoses:  Principal Problem:   Right below-knee amputee [facility]) Active Problems:   Acute on chronic anemia DVT prophylaxis Pain management Mood stabilization Klebsiella/Proteus/Enterococcus wound infection Type 2 diabetes mellitus CAD/biventricular CHF End-stage renal disease with hemodialysis Morbid obesity Hypotension Hypothyroidism Chronic respiratory failure  Discharged Condition: Stable  Significant Diagnostic Studies: DG Abd 2 Views  Result Date: 12/08/2022 CLINICAL DATA:  Constipation. EXAM: ABDOMEN - 2 VIEW COMPARISON:  06/12/2022. FINDINGS: The bowel gas pattern is normal. There is no evidence of free air. A moderate amount of retained stool is present in the colon and rectum. No radio-opaque calculi. Vascular calcifications are noted in the pelvis and bilateral lower extremities. IMPRESSION: Moderate amount of retained stool in the colon and rectum. Electronically Signed   By: Thornell Sartorius M.D.   On: 12/08/2022 21:13   DG CHEST PORT 1 VIEW  Result Date: 11/17/2022 CLINICAL DATA:  Shortness of breath EXAM: PORTABLE CHEST 1 VIEW COMPARISON:  Radiograph 09/01/2022 FINDINGS: Right IJ CVC tip in the low SVC. Stable cardiomegaly. Coronary stenting. Aortic atherosclerotic calcification. No focal consolidation, pleural effusion, or pneumothorax. No displaced rib fractures. IMPRESSION: No active disease.  Cardiomegaly. Electronically Signed   By: Minerva Fester M.D.   On: 11/17/2022 20:38    Labs:  Basic Metabolic Panel: Recent Labs  Lab 12/06/22 1539  NA 132*  K 3.7  CL 95*  CO2 26  GLUCOSE 273*  BUN 70*  CREATININE 4.85*  CALCIUM 9.1  PHOS 4.1    CBC: Recent Labs  Lab 12/06/22 1538  WBC 6.8  HGB 10.4*  HCT 34.7*  MCV 79.2*  PLT 275    CBG: Recent Labs  Lab 12/09/22 2054 12/09/22 2211  12/10/22 0620 12/10/22 1143 12/10/22 1630  GLUCAP 127* 195* 125* 120* 126*   Family history.  Mother with myocardial infarction.  Denies any colon cancer esophageal cancer or rectal cancer  Brief HPI:   Susan [name] is a 72 y.o. 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 difficile colitis, PAF on Eliquis history of tobacco use in the past with chronic respiratory failure oxygen dependent, end-stage renal disease with hemodialysis, bilateral lower extremity ulcers status post right BKA 12/23 complicated by wound dehiscence status post revision and left shin venous ulcer.  She was at a skilled nursing facility since BKA revision 1/24 with multiple admissions due to medical issues.  She was unable to bear weight on the leg and eventually discharged to home 09/23/2022 doing a [employer] lift.  She developed sacral decubitus had issues with volume overload issues with fistula status post thrombectomy 08/20/2022 and developed new traumatic ischemic ulcer on the left medial calf 5/24 treated with Unna boots followed by Kerecis micro graft.  She was seen in the office by Dr. Lajoyce Corners 11/10/2022 found to have increasing necrotic tissue with maggots in the wound and sent to the hospital for admission and management.  She was started on IV Zosyn and vancomycin underwent irrigation debridement placement of wound VAC 6/21 by Dr. Lajoyce Corners.  She was found to have anhedonia, acute on chronic insomnia episodes of panic attacks.  Psychiatry services consulted for input 6/18 due to longstanding history of MDD/GAD with panic attacks and patient transition to  venlafaxine XR.  She did express wishes to stop HD transfer to hospice of leg cannot be salvaged.  Palliative care consulted recommended to discuss goals of care.  Her  blood cultures were negative wound culture grew out Proteus Mirabella's Klebsiella Enterococcus.  Antibiotics changed to cefazolin recommendations to complete a 2-week course per infectious disease.  Hemodialysis ongoing.  Therapy evaluations completed and recommendations of physical medicine inpatient rehab services due to debility   Hospital Course: AYAME KOVALCIK was admitted to rehab 11/20/2022 for inpatient therapies to consist of PT, ST and OT at least three hours five days a week. Past admission physiatrist, therapy team and rehab RN have worked together to provide customized collaborative inpatient rehab.  Pertaining to patient's right BKA revision 1/24 and left leg wound followed by orthopedic services and wound care as directed.  She remained on chronic Eliquis/amiodarone for PAF cardiac rate controlled monitoring for any bleeding episodes..  Pain management with the use of Robaxin as well as oxycodone.  Mood stabilization with history of anxiety depression emotional support provided maintained on Rozerem at bedtime as well as scheduled low-dose Klonopin.  Lipitor ongoing for hyperlipidemia.  CAD with biventricular CHF monitoring for any signs of fluid overload she did have some hypotension maintained on ProAmatine.  End-stage renal disease with hemodialysis as directed.  History of GI bleed with Protonix twice daily.  Chronic anemia latest hemoglobin 8.2.  Morbid obesity BMI 30.7 for dietary follow-up.  Oxygen dependent for chronic respiratory failure monitoring of oxygen saturations.  Blood sugars overall controlled on insulin therapy.   Blood pressures were monitored on TID basis and soft and monitored  Diabetes has been monitored with ac/hs CBG checks and SSI was use prn for tighter BS control.    Rehab course: During patient's stay in rehab weekly team conferences were held to monitor patient's progress, set goals and discuss barriers to discharge. At admission, patient required max assist  sit to stand minimal guard sit to supine  Physical exam.  Blood pressure 100/41 pulse 67 temperature 97.7 respirations 16 oxygen saturation is 99% room air Constitutional.  No acute distress HEENT Head.  Normocephalic and atraumatic Eyes.  Pupils round and reactive to light no discharge without nystagmus Neck.  Supple nontender no JVD without thyromegaly Cardiac regular rate and rhythm without any extra sounds or murmur heard Abdomen.  Soft nontender positive bowel sounds without rebound Respiratory effort normal no respiratory distress without wheeze Skin.  Right BKA and shrinker sock distal limit.  Well-healed Left calf wound wrapped in Ace bandage without drainage Left first toe ulcer unstageable Dry flaking skin Musculoskeletal Right upper extremity 4/5 elbow flexion extension wrist flexion finger abduction Left upper extremity 4/5 throughout Left lower extremity 1/5 HF 4/5 KE 3/5 DF 3/5 EHL 4/5 PF Right lower extremity 4/5 HF 4/5 KE     Media Information   Document Information  Photos  Left lower butt  11/20/2022 18:03  Attached To:  Hospital Encounter on 11/20/22  Source Information  Pierre Bali, RN  [facility]  Document History    Media Information   Document Information  Photos    11/26/2022 09:04  Attached To:  Hospital Encounter on 11/20/22  Source Information  Fanny Dance, MD  [facility]  Document History    Media Information   Document Information  Photos    11/28/2022 09:36  Attached To:  Hospital Encounter on 11/20/22  Source Information  Fanny Dance, MD  [facility]  Document History    He/She  has had improvement in activity tolerance, balance, postural control as well as ability to compensate for deficits. He/She has had improvement in functional use RUE/LUE  and RLE/LLE as well as improvement in awareness.  Perform rolling bilaterally with supervision.  Perform left side-lying to sit with  minimal assist.  Family was assisting on building a ramp.  Education provided to patient current ramp with steep and not ADA compliant discussed 4 every inch of height there would be a 12 inch of run to decrease to stiffness of the slope and allow for safe independent wheelchair mobility.  Patient donned right lower extremity silicone sleeve with minimal assist and soft with supervision and prosthetic socket with moderate assist.  Patient had some anxiety about standing but agreeable needing some encouragement attempted sit to stand parallel bars max assist +2 assist for safety.  Full family teaching completed plan discharge to home       Disposition: Discharge to home    Diet: 1200 mL fluid restriction  Special Instructions: No driving smoking or alcohol  Continue hemodialysis as directed  Wound care to sacrum.  Cleanse with normal saline pat dry.  Apply Xeroform gauze to open areas, cover with dry gauze and secure with silicone foam dressing.  Change Xeroform daily, may reuse silicone foam for up to 3 days.  Change as needed soiling.  May cleanse with normal saline apply Aquacel to wound and cover with 4 x 4 gauze and Ace wrap.  Change twice daily if to moist  Medications at discharge 1.  Tylenol as needed 2.  Amiodarone 200 mg p.o. daily 3.  Eliquis 5 mg p.o. twice daily 4.  Vitamin C 500 mg p.o. daily 5.  Lipitor 80 mg p.o. daily 6.  Klonopin 0.5 mg p.o. twice daily 7.  Aranesp weekly with dialysis 8.  Folic acid 1 mg p.o. daily 9.  Synthroid 75 mcg p.o. daily 10.  Robaxin 750 mg p.o. every 6 hours as needed for muscle spasms 11.  Reglan 5 mg p.o. 3 times daily daily before meals 12.  ProAmatine 10 mg p.o. every Tuesday Thursday Saturday hemodialysis 13.  Oxycodone 2.5-5 mg every 4 hours as needed pain 14.  MiraLAX daily hold for loose stools 15.  Rozerem 8 mg p.o. nightly 16.  Senokot S2 tablets bedtime 17.  Renvela 1600 mg p.o. 3 times daily daily with meals 18.  Effexor  75 mg p.o. daily 19.  Lantus insulin 40 units daily and 12 units at bedtime 20.  Albuterol inhaler 1 to 2 puffs every 6 hours as needed 21.  Tums tablets 1 tablet every 6 hours as needed indigestion 22.  Zinc sulfate 220 mg daily 23.  Protonix 40 mg twice daily Discharge Instructions     Ambulatory referral to Physical Medicine Rehab   Complete by: As directed    Moderate complexity follow-up 1 to 2 weeks right BKA        Follow-up Information     Fanny Dance, MD Follow up.   Specialty: Physical Medicine and Rehabilitation Why: Office to call for appointment Contact information: [name] [place] [address] [phone]         Nadara Mustard, MD Follow up.   Specialty: Orthopedic Surgery Why: Call for appointment Contact information: [address] [place] [address] [phone]         Bufford Buttner, MD Follow up.   Specialty: Nephrology Why: Call  for appointment Contact information: [address] [place] [address] [phone]                 Signed: Mcarthur Rossetti Katrice Goel 12/11/2022, 5:11 AM

## 2022-12-07 NOTE — Progress Notes (Signed)
Physical Therapy Session Note  Patient Details  Name: Susan [name] MRN: [id number] Date of Birth: [birth year]  Today's Date: 12/07/2022 PT Individual Time: 1300-1345 PT Individual Time Calculation (min): 45 min   Short Term Goals: Week 2:  PT Short Term Goal 1 (Week 2): Pt will initiate sit<>stand with R LE prosthetic with LRAD and +2 max A PT Short Term Goal 2 (Week 2): Pt will initiate squat pivot transfer for pt transfer into toyota 4 runner  Skilled Therapeutic Interventions/Progress Updates:      Pt supine in bed upon arrival. Pt hesitant but agreeable to therapy. Pt reports her stomach is not feeling well.   Pt incontinent of bladder. Pt performed rolling bilaterally with supervision, to donn and doff brief and pants. Pt performed L sidelying to sit with min A with use of bedrails and verbal cues provided for technique with empahsis on pushing through L elbow and R UE.   Pt doffed gown and donned shirt while sitting EOB with supervision.   Pt initiated putting on R LE prosthetic however pt has red spot on distal end and rash on inner thigh, notified nursing. Therapist opted to perform slide board transfer without prosthetic today versus squat pivot transfer. Pt performed slide board transfer with +1 mod A and +2 to stabilize WC, verbal and tactile cues provided for head hip ratio and pushing through LE UE and B UE.   Pt followed up with pt regarding home ramp. Pt reports pt husband received the instructions on how to build a ramp but not needed as pt has built ramp already at home. Pt provided picture of home ramp. Education provided that pt current ramp is steep and not ADA compliant, discussed for every inch of height there has to be 12 inches of run to decrease the steepness of the slope and allow for safe independent wheelchair mobility. Recommended pt husband rebuild ramp to ensure ADA compliance, but informed pt it is her and her husbands choice to follow or not follow therapist  recommendation. Pt verbalized understanding.   Discussed pt multiple refusal of therapy. Therapist reports she does not feel comfortable with other therapist as she is very anxious and feels more comfortable with me. Education provided that ultimately we try to have consistency with therapist but ultimately not always able to have same therapist but important to continue to participate to progress strength, activity tolerance and independence with transfers, as I will not be going home with the pt. Pt verbalized understanding.   Pt seated in [facility] with all needs within reach and seatbelt alarm on. Notified nurse to check rash and red spots on distal residual limb.    Therapy Documentation Precautions:  Precautions Precautions: Fall Precaution Comments: L lateral lower leg open wound, R rotator cuff tears, Restrictions Weight Bearing Restrictions: Yes LLE Weight Bearing: Weight bearing as tolerated  Therapy/Group: Individual Therapy  [facility] [place], [place], DPT  12/07/2022, 4:09 PM

## 2022-12-07 NOTE — Progress Notes (Signed)
Occupational Therapy Session Note  Patient Details  Name: Susan [name] MRN: [id number] Date of Birth: 04-Aug-1950  Today's Date: 12/07/2022 OT Individual Time: 0865-7846 OT Individual Time Calculation (min): 56 min  and Today's Date: 12/07/2022 OT Missed Time: 19 Minutes Missed Time Reason: Patient ill (comment);Patient fatigue;Patient unwilling/refused to participate without medical reason   Short Term Goals: Week 3:  OT Short Term Goal 1 (Week 3): STG=LTG based on LOS  Skilled Therapeutic Interventions/Progress Updates:  Pt received resting in bed for skilled OT session with focus on BADL participation. Pt agreeable to interventions at bed-level, demonstrating overall pleasant mood. Pt with un-rated pain,  OT offering intermediate rest breaks and positioning suggestions throughout session to address pain/fatigue and maximize participation/safety in session.   Pt reports improvement in overall fatigue/nausea since last session with this clinician, but continues to decline OOB activity, stating "dialysis just drains me." Of note, this patient has declined OOB activity for the last three therapy days. MD covering care messaged, anticipate refusal is related to behavioral components.   Pt found incontinent of bladder, pt encouraged to sit on bed-pan for ~10 minutes to promote continent B/B void. No success. Pt assists with anterior peri-care, heavy Mod A for posterior care. Pt able to roll R/L with cuing, reporting increased fatigue, requiring some rest breaks in between.   Pt remained resting in bed with all immediate needs met at end of session. Pt continues to be appropriate for skilled OT intervention to promote further functional independence.   Therapy Documentation Precautions:  Precautions Precautions: Fall Precaution Comments: L lateral lower leg open wound, R rotator cuff tears, Restrictions Weight Bearing Restrictions: Yes LLE Weight Bearing: Weight bearing as  tolerated   Therapy/Group: Individual Therapy  Lou Cal, OTR/L, MSOT  12/07/2022, 6:27 AM

## 2022-12-07 NOTE — Progress Notes (Signed)
Subjective: Examined in room complains of some constipation with nausea /HD yesterday on schedule  3 liters UF  Objective Vital signs in last 24 hours: Vitals:   12/06/22 1915 12/06/22 1919 12/06/22 2040 12/07/22 0545  BP: (!) 161/61  112/78 (!) 135/51  Pulse:   70 71  Resp:   17 17  Temp: 98.6 F (37 C)  97.7 F (36.5 C) 97.9 F (36.6 C)  TempSrc: Oral     SpO2:   100% 99%  Weight:  83.8 kg    Height:       Weight change:    Physical Exam: General: alert , obese chronically illl elderly female NAD Heart: RRR, no mrg Lungs: CTA  ant. [place] O2 ,non labored breathing  Abdomen: Obese NABS, soft , nt, nd/some abdominal wall edema Extremities: R BKA  , Left L extem dressing dry Susan [name] /bilat hip edema Access: RIJ tdc  dressing dry clean       OP HD=[facility] TTS  4hr, 400/800, EDW 91.5kg, 2K/2Ca, TDC, heparin 4000U bolus  - Mircera 75 mcg IV q 2 weeks  - last 6/8 - Calcitriol 0.5 three times per week    Problem/Plan: LLE wound infection: B Cx neg. S/p OR debridement/wound vac on 6/21. Wound Cx grew Proteus + Klebsiella + Enterococcus:  IV antibiotics dose has been completed . ESRD: Usual TTS schedule. "Extra" HD 7/1 due to dyspnea/orthopnea, then back to usual schedule.   UF 3 L yesterday tolerated.Next HD Tuesday 7/16 /is below her dry weight Thrombosed AVF - not salvageable, TDC in use. For new access in near future, already has appt. Hypotension/volume: BP stable- gets midodrine pre-HD. Has dense thigh edema. Keep challenging, needs lower EDW on discharge.  Anemia of ESRD: Hgb 10.4- continue Aranesp weekly while here ( given 7/3).  Metabolic bone disease: CorrCa up slightly, continue Renvela as binder. VDRA on hold.   Nutrition: Alb low, continue supps. AFib - on amiodarone/Eliquis  T2DM - historically poorly controlled. Insulin per primary AMS/Twitching - felt secondary to Lyrica toxicity in ESRD. Resolved after discontinuing medication.  Dispo: In CIR. Constipation nausea=  add as needed sorbitol to MiraLAX and stool softener   Lenny Pastel, PA-C [facility] [phone] 12/07/2022,12:21 PM  LOS: 17 days   Labs: Basic Metabolic Panel: Recent Labs  Lab 12/01/22 2248 12/02/22 1358 12/06/22 1539  NA  --  129* 132*  K  --  4.2 3.7  CL  --  93* 95*  CO2  --  24 26  GLUCOSE 417* 290* 273*  BUN  --  83* 70*  CREATININE  --  5.54* 4.85*  CALCIUM  --  8.7* 9.1  PHOS  --  4.7* 4.1   Liver Function Tests: Recent Labs  Lab 12/02/22 1358 12/06/22 1539  ALBUMIN 3.1* 2.9*   No results for input(s): "LIPASE", "AMYLASE" in the last 168 hours. No results for input(s): "AMMONIA" in the last 168 hours. CBC: Recent Labs  Lab 12/02/22 1358 12/06/22 1538  WBC 7.0 6.8  HGB 9.7* 10.4*  HCT 31.2* 34.7*  MCV 78.2* 79.2*  PLT 250 275   Cardiac Enzymes: No results for input(s): "CKTOTAL", "CKMB", "CKMBINDEX", "TROPONINI" in the last 168 hours. CBG: Recent Labs  Lab 12/06/22 1200 12/06/22 1858 12/06/22 1943 12/07/22 0637 12/07/22 1154  GLUCAP 229* 177* 175* 234* 183*    Studies/Results: No results found. Medications:   (feeding supplement) PROSource Plus  30 mL Oral BID BM   amiodarone  200 mg Oral  Daily   apixaban  5 mg Oral BID   ascorbic acid  500 mg Oral Daily   atorvastatin  80 mg Oral Daily   Chlorhexidine Gluconate Cloth  6 each Topical BID   clonazepam  0.5 mg Oral BID   darbepoetin (ARANESP) injection - DIALYSIS  150 mcg Subcutaneous Q Wed-1800   feeding supplement (NEPRO CARB STEADY)  237 mL Oral BID BM   folic acid  1 mg Oral Daily   hydrocerin   Topical BID   insulin aspart  0-6 Units Subcutaneous TID WC   insulin glargine-yfgn  20 Units Subcutaneous QHS   insulin glargine-yfgn  40 Units Subcutaneous Daily   levothyroxine  75 mcg Oral QAC breakfast   melatonin  5 mg Oral QHS   metoCLOPramide  5 mg Oral TID AC   midodrine  10 mg Oral Q T,Th,Sa-HD   pantoprazole  40 mg Oral BID AC   polyethylene glycol  17  g Oral Daily   ramelteon  8 mg Oral QHS   [START ON 12/08/2022] senna-docusate  2 tablet Oral QHS   sevelamer carbonate  1,600 mg Oral TID WC   venlafaxine XR  75 mg Oral Q breakfast

## 2022-12-07 NOTE — Progress Notes (Signed)
PROGRESS NOTE   Subjective/Complaints:  Pt c/o nausea again today. Therapy states it's affecting therapies. Hasn't really done much of any therapy in 3 days. Pt stated she was waiting on modified prosthesis.   ROS: Patient denies fever, rash, sore throat, blurred vision, dizziness, nausea, vomiting, diarrhea, cough, shortness of breath or chest pain,  headache     Objective:   No results found. Recent Labs    12/06/22 1538  WBC 6.8  HGB 10.4*  HCT 34.7*  PLT 275     Recent Labs    12/06/22 1539  NA 132*  K 3.7  CL 95*  CO2 26  GLUCOSE 273*  BUN 70*  CREATININE 4.85*  CALCIUM 9.1      Intake/Output Summary (Last 24 hours) at 12/07/2022 0807 Last data filed at 12/06/2022 2038 Gross per 24 hour  Intake 357 ml  Output 3000 ml  Net -2643 ml     Pressure Injury 08/27/22 Coccyx Mid Stage 2 -  Partial thickness loss of dermis presenting as a shallow open injury with a red, pink wound bed without slough. 39mmx5mmx0 red center unattached edges (Active)  08/27/22 1000  Location: Coccyx  Location Orientation: Mid  Staging: Stage 2 -  Partial thickness loss of dermis presenting as a shallow open injury with a red, pink wound bed without slough.  Wound Description (Comments): 41mmx5mmx0 red center unattached edges  Present on Admission:     Physical Exam: Vital Signs Blood pressure (!) 135/51, pulse 71, temperature 97.9 F (36.6 C), resp. rate 17, height 5\' 5"  (1.651 m), weight 83.8 kg, SpO2 99%.  Constitutional: No distress . Vital signs reviewed. HEENT: NCAT, EOMI, oral membranes moist Neck: supple Cardiovascular: RRR without murmur. No JVD    Respiratory/Chest: CTA Bilaterally without wheezes or rales. Normal effort    GI/Abdomen: BS +, non-tender, non-distended Ext: no clubbing, cyanosis, or edema Psych: remains anxious but cooperative  Skin:  + Right BKA wound intact.   Left calf wound  is pink and  granulating with aquacel---dressing itself is a bit soupy but wound looks good.  Coccyx, buttock wounds dressed    Neurologic exam: Alert and awake, decreased sensation below distal thighs, cranial nerves II through XII grossly intact Right IJ TDC in place MSK: No abnormal tone noted      + right shoulder rom still somewhat limited            Assessment/Plan: 1. Functional deficits which require 3+ hours per day of interdisciplinary therapy in a comprehensive inpatient rehab setting. Physiatrist is providing close team supervision and 24 hour management of active medical problems listed below. Physiatrist and rehab team continue to assess barriers to discharge/monitor patient progress toward functional and medical goals  Care Tool:  Bathing    Body parts bathed by patient: Right arm, Left arm, Chest, Face   Body parts bathed by helper: Buttocks, Front perineal area, Abdomen, Right upper leg, Left upper leg Body parts n/a: Right lower leg, Left lower leg   Bathing assist Assist Level: Dependent - Patient 0%     Upper Body Dressing/Undressing Upper body dressing   What is the patient wearing?: Hospital gown only  Upper body assist Assist Level: Maximal Assistance - Patient 25 - 49%    Lower Body Dressing/Undressing Lower body dressing      What is the patient wearing?: Incontinence brief, Hospital gown only, Ace wrap/stump shrinker     Lower body assist Assist for lower body dressing: Dependent - Patient 0%     Toileting Toileting    Toileting assist Assist for toileting: Total Assistance - Patient < 25%     Transfers Chair/bed transfer  Transfers assist  Chair/bed transfer activity did not occur: Safety/medical concerns  Chair/bed transfer assist level: Dependent - mechanical lift     Locomotion Ambulation   Ambulation assist   Ambulation activity did not occur: Safety/medical concerns          Walk 10 feet activity   Assist  Walk 10 feet  activity did not occur: Safety/medical concerns        Walk 50 feet activity   Assist Walk 50 feet with 2 turns activity did not occur: Safety/medical concerns         Walk 150 feet activity   Assist Walk 150 feet activity did not occur: Safety/medical concerns         Walk 10 feet on uneven surface  activity   Assist Walk 10 feet on uneven surfaces activity did not occur: Safety/medical concerns         Wheelchair     Assist Is the patient using a wheelchair?: Yes Type of Wheelchair: Manual    Wheelchair assist level: Dependent - Patient 0% Max wheelchair distance: 10 ft    Wheelchair 50 feet with 2 turns activity    Assist        Assist Level: Dependent - Patient 0%   Wheelchair 150 feet activity     Assist      Assist Level: Dependent - Patient 0%   Blood pressure (!) 135/51, pulse 71, temperature 97.9 F (36.6 C), resp. rate 17, height 5\' 5"  (1.651 m), weight 83.8 kg, SpO2 99%.  Medical Problem List and Plan: 1. 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 2.  Antithrombotics: -DVT/anticoagulation:  Pharmaceutical: Eliquis 5mg  BID             -antiplatelet therapy: N/A 3. Pain Management:  Tylenol, Robaxin, and Oxycodone prn.  -Patient had altered mental status and twitching suspected due to Lyrica toxicity prior to admission to CIR.  Lyrica was discontinued. -7/2 patient has been avoiding trying oxycodone due to constipation, discussed that we can give additional laxatives with this if needed.  We could try low-dose of Lyrica for phantom pain.  She is currently on Effexor. -7/3 will adjust oxycodone to 2.5 to 5 mg PRN -7/4 doing better with 2.5  oxycodone and robaxin PRN, she reports minimal benefit to phantom pain with gabapentin/lyrica in the past -7/5 continue oxycodone 2.5 mg as needed and Robaxin as needed 7/8  Robaxin --increased to 750mg  q6 prn---continue 4. Mood/Behavior/Sleep/Anxiety disorder: LCSW to follow for evaluation and support.  --chronic insomnia--has Ambien prn w/ Ramelteron 8mg  QHS, and Lorazepam. -continue melatonin 7/14 anxiety still an issue.  Is sleeping better, however. Hesitate to increase klonopin further given fatigue             -antipsychotic agents: N/A 5. Neuropsych/cognition: This patient is capable of making decisions on her own behalf.   6. Skin/Wound Care: Monitor wound for healing.  --Wound VAC d/c due to concerns of wound getting macerated per Dr. Lajoyce Corners.  --Dry dressing changes and to add silvadene to wound bed if wound starts getting dry.  -6/28 VAC was removed last night Dr. Lajoyce Corners advises to "Apply small amount of silvadine pulse gauze and ace wrap. Change daily " -11/22/22 yellowish slough/drainage from wound bed, contacted Dr. Lajoyce Corners regarding this, will see if he wants BID dressing changes-- still pending response. Will f/up tomorrow. For now, doesn't look like worsening infection, can monitor closely. Will undress wound and re-photograph tomorrow -11/23/22 wound looks about the same or maybe a little better today, Dr. Lajoyce Corners apparently out of town per patient, will change to BID silvadene and dressing changes for now, to have closer eyes on wound, but may want to touch base with Dr. Lajoyce Corners this week to check if he would like to adjust anything.  -7/3 PA with ortho evaluated her wound today, recommended just dry dressings for now, appreciate assistance -7/13 continue left leg wound dressing with aquacel with 4x4 on 7/11.  WBAT LLE 7/14 aquacel dressing needs to be changed daily! Twice daily if needed 7. Fluids/Electrolytes/Nutrition:  Strict I/O with 1200 cc FR. Monitor intake. Continue  supplements/vitamins.  8. Kleb/Proteus/Enterococcus wound infection: Cefazolin for 2 weeks completed 9.  T2DM: Hgb A1c-7.9 08/27/22 -Monitor BS ac/hs and use SSI for elevated BS. Titrate basal insulin as indicated.  -6/28 she is on Semglee 40 units in the morning and 20 units at bedtime.  Increase bedtime Semglee to 22 units --7/4 decrease Semglee to 38 units in AM, 16 in afternoon due to hypoglycemia event -7/5 Cbgs controlled, continue to monitor  -11/29/22 another hypoglycemic event this morning, mostly <200 during day, will further decrease Semglee to 36U qAM + 14U qPM to see if we have fewer morning lows-- monitor -11/30/22 CBG high this morning but apparently didn't get her AM dose of insulin yesterday-- so today was the first day of the reduced dose. Will have to monitor trend. Of note, pharmacy states her dose PTA was 45U daily, not BID dosing 7/12 semglee is 38u and 18u---persistent elevation  -adjust to 40 and 20 u today CBG (last 3)  Recent Labs    12/06/22 1858 12/06/22 1943 12/07/22 0637  GLUCAP 177* 175* 234*   7/14--continue current insulin given her reports of nausea, cover with SSI  10. CAD/biventricular CHF: On GDMT limited due to hypotension.   --Continue Lipitor 80mg  QD   -11/30/22 wt stable, monitor Filed Weights   12/04/22 1750 12/06/22 1518 12/06/22 1919  Weight: 85.3 kg 86.8 kg 83.8 kg    11. ESRD: HD TTS at the end of the day to help with tolerance of therapy.  Thrombosed AVF not salvageable.                --has been on regular diet-->? Low salt restrictions.   -6/28 nephrology following appreciate assistance, next HD tomorrow  -7/1 HD plans HD today due to orthopnea + SOB -7/2 patient has scheduled HD session again today, hopefully will have benefit to orthopnea although this may be anxiety related also  -7/12 volume mgt per nephrology  12. Depression with anxiety d/o, panic attacks: Now on Venlafaxine XR 75mg   QD with Lorazepam at night.--lorazepam not ordered,  will reorder 0.5mg  QHS PRN, changed to scheduled starting 11/23/22 as mentioned above.   13. PAF: On Eliquis and amiodarone 200mg  QD for rate control.   - HR stable in 60-70s, continue current     12/07/2022    5:45 AM 12/06/2022    8:40 PM 12/06/2022    7:19 PM  Vitals with BMI  Weight   184 lbs 12 oz  BMI   30.74  Systolic 135 112   Diastolic 51 78   Pulse 71 70     14. Anemia of chronic disease:  -11/22/22 Hgb stable at 8.2 today, monitor labs  -7/2 nephrology ordering Aranesp 15.   H/o GIB due to stercoral ulcer: Avoid constipation. Continue Protonix 40mg  BID, miralax QD , Senokot 2 tabs BID, and PRNs.   -11/23/22 last BM yesterday, monitor  -7/1 Small BM yesterday, increase miralax to BID for constipation   -7/2 last BM yesterday although small, continue sorbitol as needed  -7/3 sorbitol 45ml ordered  -7/5 LBM today, brown, changed miralax from BID to daily  -12/04/22   LBM  --needs bm today 16. Morbid Obesity. Dietary education  -Body mass index is 30.74 kg/m. 17.  Hypotension  -Taking midodrine 10mg  prior to dialysis  -6/29-30/24 BPs stable, monitor -7/2 consider more regular treatment of midodrine if orthostatic symptoms become limiting  -7/4-6 stable today, continue to monitor  -7/12--continue midodrine, volume mgt per renal Vitals:   12/06/22 1254 12/06/22 1516 12/06/22 1530 12/06/22 1600  BP: (!) 133/51 (!) 154/71 (!) 167/70 (!) 165/67   12/06/22 1630 12/06/22 1700 12/06/22 1730 12/06/22 1800  BP: (!) 161/67 (!) 157/63 (!) 161/64 (!) 156/61   12/06/22 1830 12/06/22 1915 12/06/22 2040 12/07/22 0545  BP: (!) 158/65 (!) 161/61 112/78 (!) 135/51    18. Hypothyroidism: continue synthroid qAM 19. Oxygen dependence/chronic respiratory failure: pt states she uses PRN O2 at home, mostly related to anxiety; ordered O2 2L via [place] to maintain SpO2 >90%. Pt with clear lung sounds, doubt need for emergent work up today. Monitor  -Okay to trial increasing time off nasal  cannula O2, provide reassurance  -off O2 as tolerated, anxiety component  20. Nausea: unclear etiology -recurrent  -has reglan and compazine prn  -abdomen benign appearing  -amiodarone effect but this is chronic med  -suspect anxiety component  -7/14 will change reglan to 5mg  tid scheduled   -hold rena-vit for now   -request input from nephrology regarding potential renal source   -ego support/manage anxiety  LOS: 17 days A FACE TO FACE EVALUATION WAS PERFORMED  Ranelle Oyster 12/07/2022, 8:07 AM

## 2022-12-08 ENCOUNTER — Inpatient Hospital Stay (HOSPITAL_COMMUNITY): Payer: PPO

## 2022-12-08 DIAGNOSIS — R110 Nausea: Secondary | ICD-10-CM

## 2022-12-08 LAB — GLUCOSE, CAPILLARY
Glucose-Capillary: 83 mg/dL (ref 70–99)
Glucose-Capillary: 208 mg/dL — ABNORMAL HIGH (ref 70–99)
Glucose-Capillary: 325 mg/dL — ABNORMAL HIGH (ref 70–99)
Glucose-Capillary: 341 mg/dL — ABNORMAL HIGH (ref 70–99)

## 2022-12-08 MED ORDER — INSULIN ASPART 100 UNIT/ML IJ SOLN
3.0000 [IU] | Freq: Three times a day (TID) | INTRAMUSCULAR | Status: DC
  Administered 2022-12-09 – 2022-12-11 (×7): 3 [IU] via SUBCUTANEOUS

## 2022-12-08 MED ORDER — SORBITOL 70 % SOLN
45.0000 mL | Freq: Once | Status: AC
  Administered 2022-12-08: 45 mL via ORAL
  Filled 2022-12-08: qty 60

## 2022-12-08 MED ORDER — INSULIN GLARGINE-YFGN 100 UNIT/ML ~~LOC~~ SOLN
12.0000 [IU] | Freq: Every day | SUBCUTANEOUS | Status: DC
  Administered 2022-12-08 – 2022-12-11 (×4): 12 [IU] via SUBCUTANEOUS
  Filled 2022-12-08 (×5): qty 0.12

## 2022-12-08 NOTE — Progress Notes (Signed)
PROGRESS NOTE   Subjective/Complaints:  Nausea has been a little bit improved today.  She has not had a good bowel movement in several days.  She needs to get fatigued after dialysis.  ROS: Patient denies fever, rash, sore throat, blurred vision, dizziness, vomiting, diarrhea, cough, shortness of breath or chest pain,  headache   + Nausea-improved, constipation  Objective:   No results found. Recent Labs    12/06/22 1538  WBC 6.8  HGB 10.4*  HCT 34.7*  PLT 275     Recent Labs    12/06/22 1539  NA 132*  K 3.7  CL 95*  CO2 26  GLUCOSE 273*  BUN 70*  CREATININE 4.85*  CALCIUM 9.1      Intake/Output Summary (Last 24 hours) at 12/08/2022 1478 Last data filed at 12/07/2022 1835 Gross per 24 hour  Intake 237 ml  Output --  Net 237 ml     Pressure Injury 08/27/22 Coccyx Mid Stage 2 -  Partial thickness loss of dermis presenting as a shallow open injury with a red, pink wound bed without slough. 35mmx5mmx0 red center unattached edges (Active)  08/27/22 1000  Location: Coccyx  Location Orientation: Mid  Staging: Stage 2 -  Partial thickness loss of dermis presenting as a shallow open injury with a red, pink wound bed without slough.  Wound Description (Comments): 19mmx5mmx0 red center unattached edges  Present on Admission:     Physical Exam: Vital Signs Blood pressure (!) 145/76, pulse 75, temperature 98 F (36.7 C), temperature source Oral, resp. rate 18, height 5\' 5"  (1.651 m), weight 83.8 kg, SpO2 100%.  Constitutional: No distress . Vital signs reviewed.  Lying in bed HEENT: NCAT, EOMI, oral membranes moist Neck: supple Cardiovascular: RRR without murmur. No JVD    Respiratory/Chest: CTA Bilaterally without wheezes or rales. Normal effort    GI/Abdomen: BS +, non-tender, non-distended, soft Ext: no clubbing, cyanosis, or edema Psych: remains anxious but cooperative  Skin:  + Right BKA wound intact.   Wearing limb protector  Left calf wound  is pink and granulating with aquacel---dressing itself is a bit soupy but wound looks good.  Coccyx, buttock wounds dressed    Neurologic exam: Alert and awake, decreased sensation below distal thighs, cranial nerves II through XII grossly intact Right IJ TDC in place MSK: No abnormal tone noted      + right shoulder rom still somewhat limited            Assessment/Plan: 1. Functional deficits which require 3+ hours per day of interdisciplinary therapy in a comprehensive inpatient rehab setting. Physiatrist is providing close team supervision and 24 hour management of active medical problems listed below. Physiatrist and rehab team continue to assess barriers to discharge/monitor patient progress toward functional and medical goals  Care Tool:  Bathing    Body parts bathed by patient: Right arm, Left arm, Chest, Face   Body parts bathed by helper: Buttocks, Front perineal area, Abdomen, Right upper leg, Left upper leg Body parts n/a: Right lower leg, Left lower leg   Bathing assist Assist Level: Dependent - Patient 0%     Upper Body Dressing/Undressing Upper body dressing  What is the patient wearing?: Hospital gown only    Upper body assist Assist Level: Maximal Assistance - Patient 25 - 49%    Lower Body Dressing/Undressing Lower body dressing      What is the patient wearing?: Incontinence brief, Hospital gown only, Ace wrap/stump shrinker     Lower body assist Assist for lower body dressing: Dependent - Patient 0%     Toileting Toileting    Toileting assist Assist for toileting: Total Assistance - Patient < 25%     Transfers Chair/bed transfer  Transfers assist  Chair/bed transfer activity did not occur: Safety/medical concerns  Chair/bed transfer assist level: Dependent - mechanical lift     Locomotion Ambulation   Ambulation assist   Ambulation activity did not occur: Safety/medical concerns           Walk 10 feet activity   Assist  Walk 10 feet activity did not occur: Safety/medical concerns        Walk 50 feet activity   Assist Walk 50 feet with 2 turns activity did not occur: Safety/medical concerns         Walk 150 feet activity   Assist Walk 150 feet activity did not occur: Safety/medical concerns         Walk 10 feet on uneven surface  activity   Assist Walk 10 feet on uneven surfaces activity did not occur: Safety/medical concerns         Wheelchair     Assist Is the patient using a wheelchair?: Yes Type of Wheelchair: Manual    Wheelchair assist level: Dependent - Patient 0% Max wheelchair distance: 10 ft    Wheelchair 50 feet with 2 turns activity    Assist        Assist Level: Dependent - Patient 0%   Wheelchair 150 feet activity     Assist      Assist Level: Dependent - Patient 0%   Blood pressure (!) 145/76, pulse 75, temperature 98 F (36.7 C), temperature source Oral, resp. rate 18, height 5\' 5"  (1.651 m), weight 83.8 kg, SpO2 100%.  Medical Problem List and Plan: 1. 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 2.  Antithrombotics: -DVT/anticoagulation:  Pharmaceutical: Eliquis 5mg  BID             -antiplatelet therapy: N/A 3. Pain Management:  Tylenol, Robaxin, and Oxycodone prn.  -Patient had altered mental status and twitching suspected due to Lyrica toxicity prior to admission to CIR.  Lyrica was discontinued. -7/2 patient has been avoiding trying oxycodone due to constipation, discussed that we can give additional laxatives with this if needed.  We could try low-dose of Lyrica for phantom pain.  She is currently on  Effexor. -7/3 will adjust oxycodone to 2.5 to 5 mg PRN -7/4 doing better with 2.5 oxycodone and robaxin PRN, she reports minimal benefit to phantom pain with gabapentin/lyrica in the past -7/5 continue oxycodone 2.5 mg as needed and Robaxin as needed 7/8  Robaxin --increased to 750mg  q6 prn---continue 4. Mood/Behavior/Sleep/Anxiety disorder: LCSW to follow for evaluation and support.  --chronic insomnia--has Ambien  prn w/ Ramelteron 8mg  QHS, and Lorazepam. -continue melatonin 7/14 anxiety still an issue. Is sleeping better, however. Hesitate to increase klonopin further given fatigue             -antipsychotic agents: N/A 5. Neuropsych/cognition: This patient is capable of making decisions on her own behalf.   6. Skin/Wound Care: Monitor wound for healing.  --Wound VAC d/c due to concerns of wound getting macerated per Dr. Lajoyce Corners.  --Dry dressing changes and to add silvadene to wound bed if wound starts getting dry.  -6/28 VAC was removed last night Dr. Lajoyce Corners advises to "Apply small amount of silvadine pulse gauze and ace wrap. Change daily " -11/22/22 yellowish slough/drainage from wound bed, contacted Dr. Lajoyce Corners regarding this, will see if he wants BID dressing changes-- still pending response. Will f/up tomorrow. For now, doesn't look like worsening infection, can monitor closely. Will undress wound and re-photograph tomorrow -11/23/22 wound looks about the same or maybe a little better today, Dr. Lajoyce Corners apparently out of town per patient, will change to BID silvadene and dressing changes for now, to have closer eyes on wound, but may want to touch base with Dr. Lajoyce Corners this week to check if he would like to adjust anything.  -7/3 PA with ortho evaluated her wound today, recommended just dry dressings for now, appreciate assistance -7/13 continue left leg wound dressing with aquacel with 4x4 on 7/11.  WBAT LLE 7/14 aquacel dressing needs to be changed daily! Twice daily if needed 7.  Fluids/Electrolytes/Nutrition:  Strict I/O with 1200 cc FR. Monitor intake. Continue supplements/vitamins.  8. Kleb/Proteus/Enterococcus wound infection: Cefazolin for 2 weeks completed 9.  T2DM: Hgb A1c-7.9 08/27/22 -Monitor BS ac/hs and use SSI for elevated BS. Titrate basal insulin as indicated.  -6/28 she is on Semglee 40 units in the morning and 20 units at bedtime.  Increase bedtime Semglee to 22 units --7/4 decrease Semglee to 38 units in AM, 16 in afternoon due to hypoglycemia event -7/5 Cbgs controlled, continue to monitor  -11/29/22 another hypoglycemic event this morning, mostly <200 during day, will further decrease Semglee to 36U qAM + 14U qPM to see if we have fewer morning lows-- monitor -11/30/22 CBG high this morning but apparently didn't get her AM dose of insulin yesterday-- so today was the first day of the reduced dose. Will have to monitor trend. Of note, pharmacy states her dose PTA was 45U daily, not BID dosing 7/12 semglee is 38u and 18u---persistent elevation  -adjust to 40 and 20 u today 7/15 will consult DM coordinator, Glucose readings have been labile  CBG (last 3)  Recent Labs    12/07/22 2113 12/08/22 0604 12/08/22 1220  GLUCAP 260* 83 208*   7/14--continue current insulin given her reports of nausea, cover with SSI  10. CAD/biventricular CHF: On GDMT limited due to hypotension.   --Continue Lipitor 80mg  QD   -11/30/22 wt stable, monitor Filed Weights   12/04/22 1750 12/06/22 1518 12/06/22 1919  Weight: 85.3 kg 86.8 kg 83.8 kg    11. ESRD: HD TTS at the end of the day to help with tolerance of therapy.  Thrombosed AVF not salvageable.                --has been on regular diet-->? Low salt restrictions.   -6/28 nephrology following appreciate assistance, next HD tomorrow  -7/1 HD plans HD today due to orthopnea + SOB -7/2 patient has scheduled HD session again today, hopefully will have benefit to orthopnea although  this may be anxiety related also  -7/12  volume mgt per nephrology  12. Depression with anxiety d/o, panic attacks: Now on Venlafaxine XR 75mg  QD with Lorazepam at night.--lorazepam not ordered, will reorder 0.5mg  QHS PRN, changed to scheduled starting 11/23/22 as mentioned above.   13. PAF: On Eliquis and amiodarone 200mg  QD for rate control.   - HR remains controled in 70s     12/08/2022    6:01 AM 12/07/2022    7:22 PM 12/07/2022    2:16 PM  Vitals with BMI  Systolic 145 132 161  Diastolic 76 51 61  Pulse 75 76 73    14. Anemia of chronic disease:  -11/22/22 Hgb stable at 8.2 today, monitor labs  -7/2 nephrology ordering Aranesp 15.   H/o GIB due to stercoral ulcer: Avoid constipation. Continue Protonix 40mg  BID, miralax QD , Senokot 2 tabs BID, and PRNs.   -11/23/22 last BM yesterday, monitor  -7/1 Small BM yesterday, increase miralax to BID for constipation   -7/2 last BM yesterday although small, continue sorbitol as needed  -7/3 sorbitol 45ml ordered  -7/5 LBM today, brown, changed miralax from BID to daily  -12/04/22   LBM  --needs bm today  -will order sorbitol 45ml, check Xray abdomen, PRN enema  16. Morbid Obesity. Dietary education  -Body mass index is 30.74 kg/m. 17.  Hypotension  -Taking midodrine 10mg  prior to dialysis  -6/29-30/24 BPs stable, monitor -7/2 consider more regular treatment of midodrine if orthostatic symptoms become limiting  -7/4-6 stable today, continue to monitor  -7/12--continue midodrine, volume mgt per renal  7/15 BP appears to be overall controlled Vitals:   12/06/22 1600 12/06/22 1630 12/06/22 1700 12/06/22 1730  BP: (!) 165/67 (!) 161/67 (!) 157/63 (!) 161/64   12/06/22 1800 12/06/22 1830 12/06/22 1915 12/06/22 2040  BP: (!) 156/61 (!) 158/65 (!) 161/61 112/78   12/07/22 0545 12/07/22 1416 12/07/22 1922 12/08/22 0601  BP: (!) 135/51 (!) 144/61 (!) 132/51 (!) 145/76    18. Hypothyroidism: continue synthroid qAM 19. Oxygen dependence/chronic respiratory failure: pt states  she uses PRN O2 at home, mostly related to anxiety; ordered O2 2L via [place] to maintain SpO2 >90%. Pt with clear lung sounds, doubt need for emergent work up today. Monitor  -Okay to trial increasing time off nasal cannula O2, provide reassurance  -off O2 as tolerated, anxiety component  20. Nausea: unclear etiology -recurrent  -has reglan and compazine prn  -abdomen benign appearing  -amiodarone effect but this is chronic med  -suspect anxiety component  -7/14 will change reglan to 5mg  tid scheduled   -hold rena-vit for now   -request input from nephrology regarding potential renal source   -ego support/manage anxiety  -check abdominal xray  LOS: 18 days A FACE TO FACE EVALUATION WAS PERFORMED  Fanny Dance 12/08/2022, 8:21 AM

## 2022-12-08 NOTE — Progress Notes (Signed)
Occupational Therapy Session Note  Patient Details  Name: Susan [name] MRN: [id number] Date of Birth: 02/25/1951  Today's Date: 12/08/2022 OT Individual Time: 9562-1308 OT Individual Time Calculation (min): 71 min   Today's Date: 12/08/2022 OT Individual Time: 1505-1530 OT Individual Time Calculation (min): 25 min   Short Term Goals: Week 3:  OT Short Term Goal 1 (Week 3): STG=LTG based on LOS  Skilled Therapeutic Interventions/Progress Updates:   Session 1: Pt received resting in bed for skilled OT session with focus on BADL participation. Pt agreeable to interventions, demonstrating overall pleasant mood. Pt with un-rated pain in BLE, but stating "it's doing okay. . . ". OT offering intermediate rest breaks and positioning suggestions throughout session to address pain/fatigue and maximize participation/safety in session.  Pt found to be incontinent of urine, Max A for brief change, pt able to assist with R/L rolling (supervision) and anterior pericare. Pt comes to EOB with Mod A + able to scoot towards edge with supervision. At EOB, pt requires A to thread LLE into pants, able to bring pants towards thighs and over residual limb. Pt requires Max A for donning pants over bottom/hips, assisting with lateral leans. Pt attempts to do the above, but is limited by sensation in her hands, stating "it would be easier if my fingers weren't numb." Pt slide board transfers from EOB>WC with overall Min A (going towards R-side). Pt continues to require increased cuing for technique and anxiety management across slide board.  Sitting at sink-side, pt completes grooming with distant supervision. Pt dons shirt with item-retrieval. Pt propels WC from sink-side towards nurses station, ~45ft with assistance for steering, pt dependent for remainder of transport to main therapy gym. In main therapy gym, pt uses arm circle at lowest resistance 2x2 min intervals for general strengthening required for functional  transfers.   Pt remained sitting in [facility] with all immediate needs met at end of session. Pt continues to be appropriate for skilled OT intervention to promote further functional independence.   Session 2: Pt received resting in bed for skilled OT session with focus on isometric strengthening. Pt agreeable to interventions, demonstrating overall pleasant mood. Pt reported 7/10 in LLE, ". . . I had that shoe on all morning." OT offering intermediate rest breaks and positioning suggestions throughout session to address pain/fatigue and maximize participation/safety in session.   Pt defers OOB activity this session, requesting UE strengthening. Pt participates in exercises noted below: Chest press hold Overhead press hold (no weight due to decreased ROM/pain) Shoulder Extension against bed Core flexion  Pt performs 5x10 sec holds with multimodal cuing for ideal form.  Pt remained resting in bed with all immediate needs met at end of session. Pt continues to be appropriate for skilled OT intervention to promote further functional independence.     Therapy Documentation Precautions:  Precautions Precautions: Fall Precaution Comments: L lateral lower leg open wound, R rotator cuff tears, Restrictions Weight Bearing Restrictions: Yes LLE Weight Bearing: Weight bearing as tolerated   Therapy/Group: Individual Therapy  Lou Cal, OTR/L, MSOT  12/08/2022, 6:38 AM

## 2022-12-08 NOTE — Inpatient Diabetes Management (Signed)
Inpatient Diabetes Program Recommendations  AACE/ADA: New Consensus Statement on Inpatient Glycemic Control (2015)  Target Ranges:  Prepandial:   less than 140 mg/dL      Peak postprandial:   less than 180 mg/dL (1-2 hours)      Critically ill patients:  140 - 180 mg/dL   Lab Results  Component Value Date   GLUCAP 208 (H) 12/08/2022   HGBA1C 7.9 (H) 08/27/2022    Review of Glycemic Control  Latest Reference Range & Units 12/07/22 06:37 12/07/22 11:54 12/07/22 16:38 12/07/22 21:13 12/08/22 06:04 12/08/22 12:20  Glucose-Capillary 70 - 99 mg/dL 951 (H) 884 (H) 166 (H) 260 (H) 83 208 (H)  Diabetes history:  DM 2 Outpatient Diabetes medications:  Novolog 12 units tid with meals plus SSI Lantus 45 units q HS Current orders for Inpatient glycemic control:  Novolog 0-6 units tid with meals Semglee 40 units q AM and Semglee 20 units q PM Inpatient Diabetes Program Recommendations:    Consider reducing PM Semglee to 12 units q PM.  Also consider adding Novolog meal coverage 3 units tid with meals (hold if patient eats less than 50% or NPO).   Thanks,  Beryl Meager, RN, BC-ADM Inpatient Diabetes Coordinator Pager [phone]  (8a-5p)

## 2022-12-08 NOTE — Progress Notes (Signed)
[name] Progress Note    Assessment/ Plan:   LLE wound infection: B Cx neg. S/p OR debridement/wound vac on 6/21. Wound Cx grew Proteus + Klebsiella + Enterococcus:  IV antibiotics dose has been completed . ESRD: Usual TTS schedule. "Extra" HD 7/1 due to dyspnea/orthopnea, then back to usual schedule.   Next HD Tuesday 7/16 /is below her dry weight Thrombosed AVF - not salvageable, TDC in use. For new access in near future, already has appt. Hypotension/volume: BP stable- gets midodrine pre-HD. Keep challenging, needs lower EDW on discharge.  Anemia of ESRD: Hgb 10.4- continue Aranesp weekly while here ( given 7/3).  Metabolic bone disease: CorrCa up slightly, continue Renvela as binder. VDRA on hold.   Nutrition: Alb low, continue supps. AFib - on amiodarone/Eliquis  T2DM - historically poorly controlled. Insulin per primary AMS/Twitching - felt secondary to Lyrica toxicity in ESRD. Resolved after discontinuing medication.  Dispo: In CIR. Constipation nausea= add as needed sorbitol to MiraLAX and stool softener   OP HD=[facility] TTS  4hr, 400/800, EDW 91.5kg, 2K/2Ca, TDC, heparin 4000U bolus  - Mircera 75 mcg IV q 2 weeks  - last 6/8 - Calcitriol 0.5 three times per week  Anthony Sar, MD [facility]   Subjective:   Patient seen and examined bedside. No acute events. Typically feels sick the day after HD   Objective:   BP (!) 145/76 (BP Location: Right Arm)   Pulse 75   Temp 98 F (36.7 C) (Oral)   Resp 18   Ht 5\' 5"  (1.651 m)   Wt 83.8 kg   SpO2 100%   BMI 30.74 kg/m   Intake/Output Summary (Last 24 hours) at 12/08/2022 0951 Last data filed at 12/08/2022 0844 Gross per 24 hour  Intake 357 ml  Output --  Net 357 ml   Weight change:   Physical Exam: Gen: NAD CVS: RRR Resp: normal WOB Abd: ND Ext: rt bka, LLE dressing in place. trace thigh edema b/l Neuro: awake/alert Dialysis access: RIJ TDC c/d/i  Imaging: No results  found.  Labs: BMET Recent Labs  Lab 12/01/22 2248 12/02/22 1358 12/06/22 1539  NA  --  129* 132*  K  --  4.2 3.7  CL  --  93* 95*  CO2  --  24 26  GLUCOSE 417* 290* 273*  BUN  --  83* 70*  CREATININE  --  5.54* 4.85*  CALCIUM  --  8.7* 9.1  PHOS  --  4.7* 4.1   CBC Recent Labs  Lab 12/02/22 1358 12/06/22 1538  WBC 7.0 6.8  HGB 9.7* 10.4*  HCT 31.2* 34.7*  MCV 78.2* 79.2*  PLT 250 275    Medications:     (feeding supplement) PROSource Plus  30 mL Oral BID BM   amiodarone  200 mg Oral Daily   apixaban  5 mg Oral BID   ascorbic acid  500 mg Oral Daily   atorvastatin  80 mg Oral Daily   Chlorhexidine Gluconate Cloth  6 each Topical BID   clonazepam  0.5 mg Oral BID   darbepoetin (ARANESP) injection - DIALYSIS  150 mcg Subcutaneous Q Wed-1800   feeding supplement (NEPRO CARB STEADY)  237 mL Oral BID BM   folic acid  1 mg Oral Daily   hydrocerin   Topical BID   insulin aspart  0-6 Units Subcutaneous TID WC   insulin glargine-yfgn  20 Units Subcutaneous QHS   insulin glargine-yfgn  40 Units Subcutaneous Daily  levothyroxine  75 mcg Oral QAC breakfast   melatonin  5 mg Oral QHS   metoCLOPramide  5 mg Oral TID AC   midodrine  10 mg Oral Q T,Th,Sa-HD   pantoprazole  40 mg Oral BID AC   polyethylene glycol  17 g Oral Daily   ramelteon  8 mg Oral QHS   senna-docusate  2 tablet Oral QHS   sevelamer carbonate  1,600 mg Oral TID WC   venlafaxine XR  75 mg Oral Q breakfast      Anthony Sar, MD [facility] 12/08/2022, 9:51 AM

## 2022-12-08 NOTE — Progress Notes (Addendum)
Physical Therapy Session Note  Patient Details  Name: Susan [name] MRN: [id number] Date of Birth: 07/22/1950  Today's Date: 12/08/2022 PT Individual Time: 1105-1200, 4098-1191 PT Individual Time Calculation (min): 55 min, 58 min   Short Term Goals: Week 2:  PT Short Term Goal 1 (Week 2): Pt will initiate sit<>stand with R LE prosthetic with LRAD and +2 max A PT Short Term Goal 2 (Week 2): Pt will initiate squat pivot transfer for pt transfer into [employer] 4 runner  Skilled Therapeutic Interventions/Progress Updates:    Treatment Session 1   Pt seated in WC upon arrival. Pt agreeable to therapy. Pt denies any pain. Therapist opted not to use prosthetic during session until follow up with prosthetic this afternoon 2/2 rash on thigh, nurse has covered red spot on distal residual limb.  Pt transported dependent in [facility] for time and energy conservation. Pt performed slide board transfer WC to mat table with +1 heavy min A, verbal cues provided for head hip ratio.   Pt performed scooting along mat table to R and to L with supervision, verbal cues provided for technique and to use L LE on floor to assist pt.   Pt performed sit<>supine on mat table with supervision. Pt performed 2x10 glute bridges with B UE on bolster, verbal cues provided for glute activation. Pt performed 2x10 glute bridges with R UE only on bolster, with use of L LE, pt positioned L LE into knee flexion with mod A. 1x10 L LE active assisted SLR, 1x10 B LE supine hip abduction.   Supine <> sit with min A, verbal cues provided for technique.  Pt performed slide board transfer mat table to [facility] with light min A, with therapist repositioning L LE as needed to facilitate weight bearing.   Pt self propelled 100 feet with min A for steering as pt continues to demo tendency to veer to L, verbal cues provided for technique.   Pt seated in WC at end of session with all needs within reach.    Treatment Session 2   Pt seated in WC upon  arrival. Pt agreeable to therapy. Pt reports knee pain bilaterally with standing.   Pt transported dependent in WC to main gym. Prosthetists present during session to assess pt fit. Therapist discussed pt reporting pain on medial aspect of knee, noted knee valgus with standing, and noted rash on inner thigh, red spot on distal residual limb.   Donned prosthetic with black sock (prior to silicone sleeve) to add further protection to distal residual limb. Education provided to ensure line of prosthetic aligns with inferior aspect of patella. Prosthetist reiterated alignment of silicone sleeve to ensure positioned in middle and pointing directly downward not an angle. Donned blue sock and green sock.   Pt performed sit<>stand at // bars with max A and pt maintaining full erect position, with therapist providing verbal and tactile cues provided for technique and stabilizing L LE to prevent buckling, while prosthetist stabilized R LE prosthetic in neutral/slightly abducted position to reduce knee valus and and pronation. Pt took 1 step forward and 1 step backwards with R LE prosthetic. Pt attempted 2nd trial of sit<>stand and pt unable to reach erect position, pt very fearful of falling, and pt adducting R LE. Donned matching shoe on L LE  (removed insole 2/2 L LE swelling) and performed repeated trials of sit<>stand, pt overall demos improved mechanics but very fearful of falling, and reports arms giving out. Therapist utilized therapeutic use of self  to offer support, encouragement and reassurance.   Discussed trialing sit<>stand and possible gait with R LE prosthetic with RW in maxi sky tomorrow for patient reassurance.   Pt reports need to use bathroom, therapist planned to do squat pivot transfer to [facility] however no bucket for BSC in room. Pt performed squat pivot transfer WC to bed with +2 max A and verbal/tactile cues provided for technique. Pt performed sit<>supine with supervision. Nurse in room at end  of session to put pt on bed pan.       Therapy Documentation Precautions:  Precautions Precautions: Fall Precaution Comments: L lateral lower leg open wound, R rotator cuff tears, Restrictions Weight Bearing Restrictions: Yes LLE Weight Bearing: Weight bearing as tolerated  Therapy/Group: Individual Therapy  [facility], , DPT  12/08/2022, 7:49 AM

## 2022-12-09 LAB — GLUCOSE, CAPILLARY
Glucose-Capillary: 223 mg/dL — ABNORMAL HIGH (ref 70–99)
Glucose-Capillary: 274 mg/dL — ABNORMAL HIGH (ref 70–99)
Glucose-Capillary: 127 mg/dL — ABNORMAL HIGH (ref 70–99)
Glucose-Capillary: 195 mg/dL — ABNORMAL HIGH (ref 70–99)

## 2022-12-09 MED ORDER — HEPARIN SODIUM (PORCINE) 1000 UNIT/ML IJ SOLN
4000.0000 [IU] | Freq: Once | INTRAMUSCULAR | Status: AC
  Administered 2022-12-09: 4000 [IU] via INTRAVENOUS

## 2022-12-09 MED ORDER — POLYETHYLENE GLYCOL 3350 17 G PO PACK
17.0000 g | PACK | Freq: Two times a day (BID) | ORAL | Status: DC
  Administered 2022-12-09 – 2022-12-11 (×3): 17 g via ORAL
  Filled 2022-12-09 (×6): qty 1

## 2022-12-09 NOTE — Progress Notes (Signed)
PROGRESS NOTE   Subjective/Complaints:  Patient reports she feels constipated although.  She has had a few small bowel movements recently.  ROS: Patient denies fever, rash, sore throat, blurred vision, dizziness, vomiting, diarrhea, cough, Abdominal pain, shortness of breath or chest pain,  headache   + Nausea-improved, constipation  Objective:   DG Abd 2 Views  Result Date: 12/08/2022 CLINICAL DATA:  Constipation. EXAM: ABDOMEN - 2 VIEW COMPARISON:  06/12/2022. FINDINGS: The bowel gas pattern is normal. There is no evidence of free air. A moderate amount of retained stool is present in the colon and rectum. No radio-opaque calculi. Vascular calcifications are noted in the pelvis and bilateral lower extremities. IMPRESSION: Moderate amount of retained stool in the colon and rectum. Electronically Signed   By: Thornell Sartorius M.D.   On: 12/08/2022 21:13   Recent Labs    12/06/22 1538  WBC 6.8  HGB 10.4*  HCT 34.7*  PLT 275     Recent Labs    12/06/22 1539  NA 132*  K 3.7  CL 95*  CO2 26  GLUCOSE 273*  BUN 70*  CREATININE 4.85*  CALCIUM 9.1      Intake/Output Summary (Last 24 hours) at 12/09/2022 0834 Last data filed at 12/09/2022 0737 Gross per 24 hour  Intake 600 ml  Output --  Net 600 ml     Pressure Injury 08/27/22 Coccyx Mid Stage 2 -  Partial thickness loss of dermis presenting as a shallow open injury with a red, pink wound bed without slough. 41mmx5mmx0 red center unattached edges (Active)  08/27/22 1000  Location: Coccyx  Location Orientation: Mid  Staging: Stage 2 -  Partial thickness loss of dermis presenting as a shallow open injury with a red, pink wound bed without slough.  Wound Description (Comments): 22mmx5mmx0 red center unattached edges  Present on Admission:     Physical Exam: Vital Signs Blood pressure (!) 133/59, pulse 73, temperature 98 F (36.7 C), temperature source Oral, resp. rate  16, height 5\' 5"  (1.651 m), weight 83.8 kg, SpO2 99%.  Constitutional: No distress . Vital signs reviewed.  Working with therapy in the gym HEENT: NCAT, EOMI, oral membranes moist Neck: supple Cardiovascular: RRR without murmur. No JVD    Respiratory/Chest: CTA Bilaterally without wheezes or rales. Normal effort    GI/Abdomen: BS +, non-tender, mildly-distended, soft Ext: no clubbing, cyanosis, or edema Psych: remains anxious but cooperative  Skin:  + Right BKA wound intact.  Wearing limb protector  Left calf wound  is pink and granulating with aquacel---dressing itself is a bit soupy but wound looks good.  Coccyx, buttock wounds dressed    Neurologic exam: Alert and awake, decreased sensation below distal thighs, cranial nerves II through XII grossly intact Right IJ TDC in place MSK: No abnormal tone noted      + right shoulder rom still somewhat limited        Working on standing with therapy-makes her feel very anxious     Assessment/Plan: 1. Functional deficits which require 3+ hours per day of interdisciplinary therapy in a comprehensive inpatient rehab setting. Physiatrist is providing close team supervision and 24 hour management of active medical  problems listed below. Physiatrist and rehab team continue to assess barriers to discharge/monitor patient progress toward functional and medical goals  Care Tool:  Bathing    Body parts bathed by patient: Right arm, Left arm, Chest, Face   Body parts bathed by helper: Buttocks, Front perineal area, Abdomen, Right upper leg, Left upper leg Body parts n/a: Right lower leg, Left lower leg   Bathing assist Assist Level: Dependent - Patient 0%     Upper Body Dressing/Undressing Upper body dressing   What is the patient wearing?: Hospital gown only    Upper body assist Assist Level: Maximal Assistance - Patient 25 - 49%    Lower Body Dressing/Undressing Lower body dressing      What is the patient wearing?: Incontinence  brief, Hospital gown only, Ace wrap/stump shrinker     Lower body assist Assist for lower body dressing: Dependent - Patient 0%     Toileting Toileting    Toileting assist Assist for toileting: Total Assistance - Patient < 25%     Transfers Chair/bed transfer  Transfers assist  Chair/bed transfer activity did not occur: Safety/medical concerns  Chair/bed transfer assist level: Supervision/Verbal cueing (slide board transfer)     Locomotion Ambulation   Ambulation assist   Ambulation activity did not occur: Safety/medical concerns          Walk 10 feet activity   Assist  Walk 10 feet activity did not occur: Safety/medical concerns        Walk 50 feet activity   Assist Walk 50 feet with 2 turns activity did not occur: Safety/medical concerns         Walk 150 feet activity   Assist Walk 150 feet activity did not occur: Safety/medical concerns         Walk 10 feet on uneven surface  activity   Assist Walk 10 feet on uneven surfaces activity did not occur: Safety/medical concerns         Wheelchair     Assist Is the patient using a wheelchair?: Yes Type of Wheelchair: Manual    Wheelchair assist level: Minimal Assistance - Patient > 75% Max wheelchair distance: 70    Wheelchair 50 feet with 2 turns activity    Assist        Assist Level: Minimal Assistance - Patient > 75%   Wheelchair 150 feet activity     Assist      Assist Level: Total Assistance - Patient < 25%   Blood pressure (!) 133/59, pulse 73, temperature 98 F (36.7 C), temperature source Oral, resp. rate 16, height 5\' 5"  (1.651 m), weight 83.8 kg, SpO2 99%.  Medical Problem List and Plan: 1. 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 -Team conference tomorrow 2.  Antithrombotics: -DVT/anticoagulation:  Pharmaceutical: Eliquis 5mg  BID             -antiplatelet therapy: N/A 3. Pain Management:  Tylenol, Robaxin, and Oxycodone prn.  -Patient had altered mental status and twitching suspected due to Lyrica toxicity prior to admission to CIR.  Lyrica was discontinued. -7/2 patient has been  avoiding trying oxycodone due to constipation, discussed that we can give additional laxatives with this if needed.  We could try low-dose of Lyrica for phantom pain.  She is currently on Effexor. -7/3 will adjust oxycodone to 2.5 to 5 mg PRN -7/4 doing better with 2.5 oxycodone and robaxin PRN, she reports minimal benefit to phantom pain with gabapentin/lyrica in the past -7/5 continue oxycodone 2.5 mg as needed and Robaxin as needed 7/8  Robaxin --increased to 750mg  q6 prn---continue 4. Mood/Behavior/Sleep/Anxiety disorder: LCSW to follow for evaluation and support.  --chronic insomnia--has Ambien prn w/ Ramelteron 8mg  QHS, and Lorazepam. -continue melatonin 7/14 anxiety still an issue. Is sleeping better, however. Hesitate to increase klonopin further given fatigue             -antipsychotic agents: N/A 5. Neuropsych/cognition: This patient is capable of making decisions on her own behalf.   6. Skin/Wound Care: Monitor wound for healing.  --Wound VAC d/c due to concerns of wound getting macerated per Dr. Lajoyce Corners.  --Dry dressing changes and to add silvadene to wound bed if wound starts getting dry.  -6/28 VAC was removed last night Dr. Lajoyce Corners advises to "Apply small amount of silvadine pulse gauze and ace wrap. Change daily " -11/22/22 yellowish slough/drainage from wound bed, contacted Dr. Lajoyce Corners regarding this, will see if he wants BID dressing changes-- still pending response. Will f/up tomorrow. For now, doesn't look like worsening infection, can monitor closely. Will  undress wound and re-photograph tomorrow -11/23/22 wound looks about the same or maybe a little better today, Dr. Lajoyce Corners apparently out of town per patient, will change to BID silvadene and dressing changes for now, to have closer eyes on wound, but may want to touch base with Dr. Lajoyce Corners this week to check if he would like to adjust anything.  -7/3 PA with ortho evaluated her wound today, recommended just dry dressings for now, appreciate assistance -7/13 continue left leg wound dressing with aquacel with 4x4 on 7/11.  WBAT LLE 7/14 aquacel dressing needs to be changed daily! Twice daily if needed 7. Fluids/Electrolytes/Nutrition:  Strict I/O with 1200 cc FR. Monitor intake. Continue supplements/vitamins.  8. Kleb/Proteus/Enterococcus wound infection: Cefazolin for 2 weeks completed 9.  T2DM: Hgb A1c-7.9 08/27/22 -Monitor BS ac/hs and use SSI for elevated BS. Titrate basal insulin as indicated.  -6/28 she is on Semglee 40 units in the morning and 20 units at bedtime.  Increase bedtime Semglee to 22 units --7/4 decrease Semglee to 38 units in AM, 16 in afternoon due to hypoglycemia event -7/5 Cbgs controlled, continue to monitor  -11/29/22 another hypoglycemic event this morning, mostly <200 during day, will further decrease Semglee to 36U qAM + 14U qPM to see if we have fewer morning lows-- monitor -11/30/22 CBG high this morning but apparently didn't get her AM dose of insulin yesterday-- so today was the first day of the reduced dose. Will have to monitor trend. Of note, pharmacy states her dose PTA was 45U daily, not BID dosing 7/12 semglee is 38u and 18u---persistent elevation  -adjust to 40 and 20 u today 7/15 will consult DM coordinator, Glucose readings have been labile 7/16 DM coordinator advised adding NovoLog meal coverage 3 units 3 times daily with meals and reducing p.m. Semglee to 12 units every afternoon, orders placed made will monitor for response  CBG (last 3)  Recent Labs     12/08/22 1640 12/08/22 2107 12/09/22 0608  GLUCAP 325* 341* 223*   7/14--continue current insulin given her  reports of nausea, cover with SSI  10. CAD/biventricular CHF: On GDMT limited due to hypotension.   --Continue Lipitor 80mg  QD   -11/30/22 wt stable, monitor Filed Weights   12/04/22 1750 12/06/22 1518 12/06/22 1919  Weight: 85.3 kg 86.8 kg 83.8 kg    11. ESRD: HD TTS at the end of the day to help with tolerance of therapy.  Thrombosed AVF not salvageable.                --has been on regular diet-->? Low salt restrictions.   -6/28 nephrology following appreciate assistance, next HD tomorrow  -7/1 HD plans HD today due to orthopnea + SOB -7/2 patient has scheduled HD session again today, hopefully will have benefit to orthopnea although this may be anxiety related also  -7/12 volume mgt per nephrology  12. Depression with anxiety d/o, panic attacks: Now on Venlafaxine XR 75mg  QD with Lorazepam at night.--lorazepam not ordered, will reorder 0.5mg  QHS PRN, changed to scheduled starting 11/23/22 as mentioned above.   13. PAF: On Eliquis and amiodarone 200mg  QD for rate control.   -7/16 heart rate stable in the 70s and 80s     12/09/2022    5:17 AM 12/08/2022    6:16 PM 12/08/2022    2:36 PM  Vitals with BMI  Systolic 133 142 161  Diastolic 59 50 76  Pulse 73 78 81    14. Anemia of chronic disease:  -11/22/22 Hgb stable at 8.2 today, monitor labs  -7/2 nephrology ordering Aranesp 15.   H/o GIB due to stercoral ulcer: Avoid constipation. Continue Protonix 40mg  BID, miralax QD , Senokot 2 tabs BID, and PRNs.   -11/23/22 last BM yesterday, monitor  -7/1 Small BM yesterday, increase miralax to BID for constipation   -7/2 last BM yesterday although small, continue sorbitol as needed  -7/3 sorbitol 45ml ordered  -7/5 LBM today, brown, changed miralax from BID to daily  -12/04/22   LBM  --needs bm today  -will order sorbitol 45ml, check Xray abdomen, PRN enema   -7/16 x-ray yesterday  with moderate stool, she has had a few small bowel movements, will order enema for today after dialysis, increase miralax to BID 16. Morbid Obesity. Dietary education  -Body mass index is 30.74 kg/m. 17.  Hypotension  -Taking midodrine 10mg  prior to dialysis  -6/29-30/24 BPs stable, monitor -7/2 consider more regular treatment of midodrine if orthostatic symptoms become limiting  -7/4-6 stable today, continue to monitor  -7/12--continue midodrine, volume mgt per renal  7/16 controlled overall Vitals:   12/06/22 1730 12/06/22 1800 12/06/22 1830 12/06/22 1915  BP: (!) 161/64 (!) 156/61 (!) 158/65 (!) 161/61   12/06/22 2040 12/07/22 0545 12/07/22 1416 12/07/22 1922  BP: 112/78 (!) 135/51 (!) 144/61 (!) 132/51   12/08/22 0601 12/08/22 1436 12/08/22 1816 12/09/22 0517  BP: (!) 145/76 (!) 150/76 (!) 142/50 (!) 133/59    18. Hypothyroidism: continue synthroid qAM 19. Oxygen dependence/chronic respiratory failure: pt states she uses PRN O2 at home, mostly related to anxiety; ordered O2 2L via [place] to maintain SpO2 >90%. Pt with clear lung sounds, doubt need for emergent work up today. Monitor  -Okay to trial increasing time off nasal cannula O2, provide reassurance  -off O2 as tolerated, anxiety component  20. Nausea: unclear etiology -recurrent  -has reglan and compazine prn  -abdomen benign appearing  -amiodarone effect but this is chronic med  -suspect anxiety component  -7/14 will change reglan to 5mg  tid scheduled   -  hold rena-vit for now   -request input from nephrology regarding potential renal source   -ego support/manage anxiety  -check abdominal xray- increase stool  LOS: 19 days A FACE TO FACE EVALUATION WAS PERFORMED  Fanny Dance 12/09/2022, 8:34 AM

## 2022-12-09 NOTE — Progress Notes (Signed)
   12/09/22 1931  Vitals  Temp 98.2 F (36.8 C)  Pulse Rate 74  Resp 15  BP (!) 123/45  SpO2 100 %  O2 Device Nasal Cannula  Oxygen Therapy  O2 Flow Rate (L/min) 2 L/min  Patient Activity (if Appropriate) In bed  Pulse Oximetry Type Continuous  Oximetry Probe Site Changed No  Post Treatment  Dialyzer Clearance Lightly streaked  Duration of HD Treatment -hour(s) 4 hour(s)  Hemodialysis Intake (mL) 0 mL  Fluid Removed (mL) 3000 mL  Tolerated HD Treatment Yes   Received patient in bed to unit.  Alert and oriented.  Informed consent signed and in chart.   TX duration:4.0  Patient tolerated well.  Transported back to the room  Alert, without acute distress.  Hand-off given to patient's nurse.   Access used: [facility] Access issues: no complications  Total UF removed: 3000 Medication(s) given: none   [facility]

## 2022-12-09 NOTE — Procedures (Signed)
I was present at this dialysis session. I have reviewed the session itself and made appropriate changes.   Filed Weights   12/06/22 1518 12/06/22 1919 12/09/22 1507  Weight: 86.8 kg 83.8 kg 86.6 kg    Recent Labs  Lab 12/06/22 1539  NA 132*  K 3.7  CL 95*  CO2 26  GLUCOSE 273*  BUN 70*  CREATININE 4.85*  CALCIUM 9.1  PHOS 4.1    Recent Labs  Lab 12/06/22 1538  WBC 6.8  HGB 10.4*  HCT 34.7*  MCV 79.2*  PLT 275    Scheduled Meds:  (feeding supplement) PROSource Plus  30 mL Oral BID BM   amiodarone  200 mg Oral Daily   apixaban  5 mg Oral BID   ascorbic acid  500 mg Oral Daily   atorvastatin  80 mg Oral Daily   Chlorhexidine Gluconate Cloth  6 each Topical BID   clonazepam  0.5 mg Oral BID   darbepoetin (ARANESP) injection - DIALYSIS  150 mcg Subcutaneous Q Wed-1800   feeding supplement (NEPRO CARB STEADY)  237 mL Oral BID BM   folic acid  1 mg Oral Daily   heparin sodium (porcine)  4,000 Units Intravenous Once   hydrocerin   Topical BID   insulin aspart  0-6 Units Subcutaneous TID WC   insulin aspart  3 Units Subcutaneous TID WC   insulin glargine-yfgn  12 Units Subcutaneous QHS   insulin glargine-yfgn  40 Units Subcutaneous Daily   levothyroxine  75 mcg Oral QAC breakfast   melatonin  5 mg Oral QHS   metoCLOPramide  5 mg Oral TID AC   midodrine  10 mg Oral Q T,Th,Sa-HD   pantoprazole  40 mg Oral BID AC   polyethylene glycol  17 g Oral BID   ramelteon  8 mg Oral QHS   senna-docusate  2 tablet Oral QHS   sevelamer carbonate  1,600 mg Oral TID WC   venlafaxine XR  75 mg Oral Q breakfast   Continuous Infusions: PRN Meds:.acetaminophen **OR** acetaminophen, calcium carbonate (dosed in mg elemental calcium), heparin, ipratropium-albuterol, methocarbamol, oxyCODONE, polyethylene glycol, prochlorperazine, sorbitol   Anthony Sar, MD [facility] 12/09/2022, 4:25 PM

## 2022-12-09 NOTE — Progress Notes (Signed)
Occupational Therapy Session Note  Patient Details  Name: Susan [name] MRN: [id number] Date of Birth: [birth year]  Today's Date: 12/09/2022 OT Individual Time: 4098-1191 OT Individual Time Calculation (min): 60 min    Short Term Goals: Week 3:  OT Short Term Goal 1 (Week 3): STG=LTG based on LOS  Skilled Therapeutic Interventions/Progress Updates:   Pt seen or skilled OT session with focus on TTB training in prep for shower prior to d/c. OT set up TTB facing out to access laterally toward R residual limb. OT managed w/c in and out of bathroom to stall shower. Pt required mod A on TTB and mod-max A out toward L LE. Pt fearful initially and fear eased with mastery. OT training with L LE sock aide with device issued for home carryover. Left pt w/c level with all safety needs and nurse call button in reach.    Pain: reports 8/10 L LE but denies need for pain meds but elevation and repositioning provided some relief as well as distraction with therapy   Therapy Documentation Precautions:  Precautions Precautions: Fall Precaution Comments: L lateral lower leg open wound, R rotator cuff tears, Restrictions Weight Bearing Restrictions: Yes LLE Weight Bearing: Weight bearing as tolerated    Therapy/Group: Individual Therapy  Vicenta Dunning 12/09/2022, 7:52 AM

## 2022-12-09 NOTE — Progress Notes (Signed)
Physical Therapy Weekly Progress Note  Patient Details  Name: Susan [name] MRN: [id number] Date of Birth: March 14, 1951  Beginning of progress report period: November 30, 2022 End of progress report period: December 09, 2022  Today's Date: 12/09/2022 PT Individual Time: 0800-0915, 1102-1200 PT Individual Time Calculation (min): 75 min , 58 min   Patient has met 2 of 2 short term goals.  Pt has made good progress towards STG's with significant encouragement from therapist as pt has anxiety and FOF. Pt is standing with +2 max A in // bars and with no AD and maxi sky with therapist positioned in front of pt with R LE prosthetic and +2 A stabilizing prosthetic prosthetic in abducted position to reduce pain. Pt is performing slide board transfer on level surfaces with +1 supervision/min A. Pt is performing squat pivot transfer with +1 max A. Pt is propelling WC with +1 min A x100 feet with therapist assisting with steering to reduce veering to L. Family training scheduled with pt husband on 7/18, and D/C on 7/19.   Patient continues to demonstrate the following deficits muscle weakness, decreased cardiorespiratoy endurance, and decreased sitting balance, decreased standing balance, and decreased balance strategies and therefore will continue to benefit from skilled PT intervention to increase functional independence with mobility.  Patient progressing toward long term goals..  Continue plan of care.  PT Short Term Goals Week 2:  PT Short Term Goal 1 (Week 2): Pt will initiate sit<>stand with R LE prosthetic with LRAD and +2 max A PT Short Term Goal 1 - Progress (Week 2): Met PT Short Term Goal 2 (Week 2): Pt will initiate squat pivot transfer for pt transfer into [employer]ota 4 runner PT Short Term Goal 2 - Progress (Week 2): Met Week 3:  PT Short Term Goal 1 (Week 3): STG=LTG 2/2 ELOS  Skilled Therapeutic Interventions/Progress Updates:      Treatment Session 1   Pt supine in bed upon arrival. Pt agreeable  to therapy. Pt denies any pain. Pt incontinent of bladder. Pt performed rolling bilaterally to donn and doff diaper with max A , perform peri care with total A. Pt performed supine to sit with CGA with HOB elevated, verbal cues provided for technique. Pt donned shirt while sitting EOB with supervision. Pt donned R LE prosthetic with max A for time and energy conservation with therapist emphasizing sequence, plan to assess pt recall of sequence next session. Slide board transfer with +1 supervision/min A.   Pt transported dependent in WC to main gym. Squat pivot transfer WC<>mat table +2 mod A. Pt scooting to L with verbal cues provided for head hip ratio for postioning directly below maxisky. Pt performed lateral trunk lean for placement of maxi sky harness. Pt attempted sit to stand with RW, however pt very fearful, transitioned to performing sit<>stand with therapist positioned in front of pt with tech stabilizing R LE into neutral/slight abducted position.   Pt performed sit to stand x3 x30 seconds-1 min with max A with therapist facilitating glute activation and trunk extension, verbal cues provided for upright posture.   Squat pivot mat table to WC with +2 mod A. Doffed prosthetic with total A.   Pt seated in WC at end of session with all needs within reach and seatbelt alarm on.   Treatment Session 2   Pt seated in WC upon arrival. Pt agreeable to therapy. Pt denies any pain other than cramps in her stomach, pt able to have BM during session, pt  reports BM provided some relief.   Pt Donned prosthetis with questioning cues for therapist for sequence with supervision for black sock, light min A for silicone sleeve to roll evenly on posterior thigh, supervision for blue and green ply socks, and max A for prosthetic, verbal cues provided to pull up bottom of black sock and stockings prior to pulling up over thigh to reduce wrinkles on distal end. Verbal cues provided to roll pants up posterior thigh  and raise leg to assist with donning silicone sleeve and socks.   Pt performed sldie board transfer WC to recliner with +1 min A to get over wheel and supervision for reaminder, verbal cues provided for head hip ratio. SBT recliner to [facility] with supervision with dycem under slide board, attempted lateral trunk lean to doff pants but ultimately opted not to 2/2 pt requiring max A and significnatly increased time and pt needing to return to bed prior to dialysis.   Pt performed squat pivot transfer with +1 mod A BSC to recliner, verbal/tactile cues provided for technique. Slide board transfer with supervisoin recliner to WC, verbal cues proivded for head hip ratio. Squat pivot transfer WC to hospital bed with +1 max A, with +2 there for safety to stabilize WC,  verbal and tactile cues provided for technique. Pt doffed prosthetic with mod A for socket as pt has difficulty pulling prosthetic off while simultaneously pressing release button. Pt doffed socks and silicone sleeve with supervision. Pt donned R LE shrinker with supervision/min A, verbal cues provided to pull it up from bottom like stocking to reduce wrinkles then pull up over thigh.   Pt performed rolling R and L to doff diaper and place bed pan. Pt slightly incontinent of bowel, pt reports she isn't done, therapist placed bed pan, and notified tech of pt position. Pt supine in bed on bed pan with all needs within reach and bed alarm on.    Therapy Documentation Precautions:  Precautions Precautions: Fall Precaution Comments: L lateral lower leg open wound, R rotator cuff tears, Restrictions Weight Bearing Restrictions: Yes LLE Weight Bearing: Weight bearing as tolerated  Therapy/Group: Individual Therapy  [facility], [place], DPT  12/09/2022, 11:21 AM

## 2022-12-09 NOTE — Progress Notes (Signed)
[facility] Progress Note   Subjective:   Patient seen and examined at bedside.  Reports feeling nauseated for a entire day following dialysis for the last week.  No issue prior to this.  Otherwise she is doing ok.  Nervous using the walker, feels like she may fall.  Denies CP, SOB, abdominal pain and n/v/d.    Objective Vitals:   12/08/22 0601 12/08/22 1436 12/08/22 1816 12/09/22 0517  BP: (!) 145/76 (!) 150/76 (!) 142/50 (!) 133/59  Pulse: 75 81 78 73  Resp: 18 18 17 16   Temp: 98 F (36.7 C) 98 F (36.7 C) 98.1 F (36.7 C) 98 F (36.7 C)  TempSrc: Oral Oral Oral Oral  SpO2: 100% 94% 100% 99%  Weight:      Height:       Physical Exam General:chronically ill appearing female in NAD Heart:RRR, no mrg Lungs:CTAB, nml WOB on RA Abdomen:soft, NTND Extremities:trace edema, R BKA Dialysis Access: [facility]   [facility] Weights   12/04/22 1750 12/06/22 1518 12/06/22 1919  Weight: 85.3 kg 86.8 kg 83.8 kg    Intake/Output Summary (Last 24 hours) at 12/09/2022 1216 Last data filed at 12/09/2022 0727 Gross per 24 hour  Intake 480 ml  Output --  Net 480 ml    Additional Objective Labs: Basic Metabolic Panel: Recent Labs  Lab 12/02/22 1358 12/06/22 1539  NA 129* 132*  K 4.2 3.7  CL 93* 95*  CO2 24 26  GLUCOSE 290* 273*  BUN 83* 70*  CREATININE 5.54* 4.85*  CALCIUM 8.7* 9.1  PHOS 4.7* 4.1   Liver Function Tests: Recent Labs  Lab 12/02/22 1358 12/06/22 1539  ALBUMIN 3.1* 2.9*   Recent Labs  Lab 12/02/22 1358 12/06/22 1538  WBC 7.0 6.8  HGB 9.7* 10.4*  HCT 31.2* 34.7*  MCV 78.2* 79.2*  PLT 250 275   CBG: Recent Labs  Lab 12/08/22 1220 12/08/22 1640 12/08/22 2107 12/09/22 0608 12/09/22 1128  GLUCAP 208* 325* 341* 223* 274*    Studies/Results: DG Abd 2 Views  Result Date: 12/08/2022 CLINICAL DATA:  Constipation. EXAM: ABDOMEN - 2 VIEW COMPARISON:  06/12/2022. FINDINGS: The bowel gas pattern is normal. There is no evidence of free air. A  moderate amount of retained stool is present in the colon and rectum. No radio-opaque calculi. Vascular calcifications are noted in the pelvis and bilateral lower extremities. IMPRESSION: Moderate amount of retained stool in the colon and rectum. Electronically Signed   By: Thornell Sartorius M.D.   On: 12/08/2022 21:13    Medications:   (feeding supplement) PROSource Plus  30 mL Oral BID BM   amiodarone  200 mg Oral Daily   apixaban  5 mg Oral BID   ascorbic acid  500 mg Oral Daily   atorvastatin  80 mg Oral Daily   Chlorhexidine Gluconate Cloth  6 each Topical BID   clonazepam  0.5 mg Oral BID   darbepoetin (ARANESP) injection - DIALYSIS  150 mcg Subcutaneous Q Wed-1800   feeding supplement (NEPRO CARB STEADY)  237 mL Oral BID BM   folic acid  1 mg Oral Daily   heparin sodium (porcine)  4,000 Units Intravenous Once   hydrocerin   Topical BID   insulin aspart  0-6 Units Subcutaneous TID WC   insulin aspart  3 Units Subcutaneous TID WC   insulin glargine-yfgn  12 Units Subcutaneous QHS   insulin glargine-yfgn  40 Units Subcutaneous Daily   levothyroxine  75 mcg Oral QAC breakfast  melatonin  5 mg Oral QHS   metoCLOPramide  5 mg Oral TID AC   midodrine  10 mg Oral Q T,Th,Sa-HD   pantoprazole  40 mg Oral BID AC   polyethylene glycol  17 g Oral Daily   ramelteon  8 mg Oral QHS   senna-docusate  2 tablet Oral QHS   sevelamer carbonate  1,600 mg Oral TID WC   venlafaxine XR  75 mg Oral Q breakfast    Dialysis Orders: [facility] TTS  4hr, 400/800, EDW 91.5kg, 2K/2Ca, TDC, heparin 4000U bolus  - Mircera 75 mcg IV q 2 weeks  - last 6/8 - Calcitriol 0.5 three times per week  Assessment/Plan: LLE wound infection: B Cx neg. S/p OR debridement/wound vac on 6/21. Wound Cx grew Proteus + Klebsiella + Enterococcus:  IV antibiotics dose has been completed . ESRD: Usual TTS schedule. "Extra" HD 7/1 due to dyspnea/orthopnea, then back to usual schedule.   Next HD today Thrombosed AVF - not salvageable,  TDC in use. For new access in near future, already has appt. Hypotension/volume: BP stable- gets midodrine pre-HD. 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  Metabolic bone disease: CorrCa up slightly, phos in goal. continue Renvela as binder. VDRA on hold.   Nutrition: Alb low, continue supps. AFib - on amiodarone/Eliquis  T2DM - historically poorly controlled. Insulin per primary AMS/Twitching - felt secondary to Lyrica toxicity in ESRD. Resolved after discontinuing medication.  Dispo: In CIR. Constipation nausea-add as needed sorbitol to MiraLAX and stool softener  Virgina Norfolk, PA-C [facility] 12/09/2022,12:16 PM  LOS: 19 days

## 2022-12-10 DIAGNOSIS — I509 Heart failure, unspecified: Secondary | ICD-10-CM

## 2022-12-10 LAB — GLUCOSE, CAPILLARY
Glucose-Capillary: 125 mg/dL — ABNORMAL HIGH (ref 70–99)
Glucose-Capillary: 120 mg/dL — ABNORMAL HIGH (ref 70–99)
Glucose-Capillary: 126 mg/dL — ABNORMAL HIGH (ref 70–99)

## 2022-12-10 MED ORDER — CALCIUM CARBONATE ANTACID 1250 MG/5ML PO SUSP: 500.0000 mg | Freq: Four times a day (QID) | ORAL | Status: DC | PRN

## 2022-12-10 MED ORDER — ACETAMINOPHEN 325 MG PO TABS: 650.0000 mg | ORAL_TABLET | Freq: Four times a day (QID) | ORAL | Status: DC | PRN

## 2022-12-10 MED ORDER — POLYETHYLENE GLYCOL 3350 17 G PO PACK: 17.0000 g | PACK | Freq: Every day | ORAL | Status: DC

## 2022-12-10 MED ORDER — SENNOSIDES-DOCUSATE SODIUM 8.6-50 MG PO TABS: 2.0000 | ORAL_TABLET | Freq: Every day | ORAL | Status: DC

## 2022-12-10 NOTE — Progress Notes (Signed)
Occupational Therapy Session Note  Patient Details  Name: Susan [name] MRN: [id number] Date of Birth: 1951/04/04  Today's Date: 12/10/2022 OT Individual Time: 1020-1130 OT Individual Time Calculation (min): 70 min    Short Term Goals: Week 3:  OT Short Term Goal 1 (Week 3): STG=LTG based on LOS  Skilled Therapeutic Interventions/Progress Updates:  Pt received sitting in [facility] for skilled OT session with focus on discharge planning, grooming, and general strengthening. Pt agreeable to interventions, demonstrating overall pleasant mood. Pt reported 8/10 pain in bilateral shoulders and L-thigh. OT offering intermediate rest breaks and positioning suggestions throughout session to address pain/fatigue and maximize participation/safety in session.   Time dedicated to coordinating caregiver education with therapy scheduler to assure patient comfortability and therefore successful education. Pt completes oral care and grooming with distant supervision. Pt dependent for WC transport to main therapy gym form energy conservation.  Pt requesting to use arm-bike for UE ROM/strengthening. Pt tolerates 2x2 mins, but then reports increased pain in L-shoulder. Moist heat applied to bilateral shoulders, pt reporting decrease in pain with a total of 20 mins application and rest. Meanwhile, pt participates in series of BLE strengthening exercises, noted below: Forward/backward heel slides Side to side heel slides  Pt completes 3x10 reps with assistance provided for R-prosthetic stabilization.   Pt remained sitting in [facility] with all immediate needs met at end of session. Pt continues to be appropriate for skilled OT intervention to promote further functional independence.   Therapy Documentation Precautions:  Precautions Precautions: Fall Precaution Comments: L lateral lower leg open wound, R rotator cuff tears, Restrictions Weight Bearing Restrictions: Yes LLE Weight Bearing: Weight bearing as  tolerated   Therapy/Group: Individual Therapy  Lou Cal, OTR/L, MSOT  12/10/2022, 6:18 AM

## 2022-12-10 NOTE — Progress Notes (Signed)
Patient ID: Susan [name], female   DOB: 1951/02/12, 72 y.o.   MRN: [id number]  Met with pt to discuss team conference progress and still plan for discharge 7/19. Her husband is coming in for family education tomorrow and will have team discuss realistic plan for prothesis and car transfers. Pt can not get up in a SUV and they have two of them, needs a regular car. Pt will arrange for transport home on Friday. Home health will resume once discharged. Husband is aware of need for 24/7 care at discharge. Pt's anxiety limits her in therapies. Husband to be here tomorrow for family education will see him them.

## 2022-12-10 NOTE — Progress Notes (Signed)
[facility] Progress Note   Subjective:   Patient seen and examined while she was doing PT. Tolerated HD yesterday with net UF 2L. No complaints.  Objective Vitals:   12/09/22 1931 12/09/22 2001 12/10/22 0430 12/10/22 1250  BP: (!) 123/45 135/64 (!) 153/71 (!) 152/65  Pulse: 74 74 78 84  Resp: 15 16 16 19   Temp: 98.2 F (36.8 C) (!) 97.5 F (36.4 C) 97.7 F (36.5 C) 98.8 F (37.1 C)  TempSrc:  Oral Oral Oral  SpO2: 100% 100% 100% 94%  Weight:      Height:       Physical Exam General:chronically ill appearing female in NAD, in wheelchair doing PT Heart:RRR, no mrg Lungs:CTAB, nml WOB on RA Abdomen:soft, NTND Extremities:trace edema LLE, R BKA Dialysis Access: [facility]   Filed Weights   12/06/22 1518 12/06/22 1919 12/09/22 1507  Weight: 86.8 kg 83.8 kg 86.6 kg    Intake/Output Summary (Last 24 hours) at 12/10/2022 1411 Last data filed at 12/10/2022 1303 Gross per 24 hour  Intake 480 ml  Output 3000 ml  Net -2520 ml    Additional Objective Labs: Basic Metabolic Panel: Recent Labs  Lab 12/06/22 1539  NA 132*  K 3.7  CL 95*  CO2 26  GLUCOSE 273*  BUN 70*  CREATININE 4.85*  CALCIUM 9.1  PHOS 4.1   Liver Function Tests: Recent Labs  Lab 12/06/22 1539  ALBUMIN 2.9*   Recent Labs  Lab 12/06/22 1538  WBC 6.8  HGB 10.4*  HCT 34.7*  MCV 79.2*  PLT 275   CBG: Recent Labs  Lab 12/09/22 1128 12/09/22 2054 12/09/22 2211 12/10/22 0620 12/10/22 1143  GLUCAP 274* 127* 195* 125* 120*    Studies/Results: DG Abd 2 Views  Result Date: 12/08/2022 CLINICAL DATA:  Constipation. EXAM: ABDOMEN - 2 VIEW COMPARISON:  06/12/2022. FINDINGS: The bowel gas pattern is normal. There is no evidence of free air. A moderate amount of retained stool is present in the colon and rectum. No radio-opaque calculi. Vascular calcifications are noted in the pelvis and bilateral lower extremities. IMPRESSION: Moderate amount of retained stool in the colon and rectum.  Electronically Signed   By: Thornell Sartorius M.D.   On: 12/08/2022 21:13    Medications:   (feeding supplement) PROSource Plus  30 mL Oral BID BM   amiodarone  200 mg Oral Daily   apixaban  5 mg Oral BID   ascorbic acid  500 mg Oral Daily   atorvastatin  80 mg Oral Daily   Chlorhexidine Gluconate Cloth  6 each Topical BID   clonazepam  0.5 mg Oral BID   darbepoetin (ARANESP) injection - DIALYSIS  150 mcg Subcutaneous Q Wed-1800   feeding supplement (NEPRO CARB STEADY)  237 mL Oral BID BM   folic acid  1 mg Oral Daily   hydrocerin   Topical BID   insulin aspart  0-6 Units Subcutaneous TID WC   insulin aspart  3 Units Subcutaneous TID WC   insulin glargine-yfgn  12 Units Subcutaneous QHS   insulin glargine-yfgn  40 Units Subcutaneous Daily   levothyroxine  75 mcg Oral QAC breakfast   melatonin  5 mg Oral QHS   metoCLOPramide  5 mg Oral TID AC   midodrine  10 mg Oral Q T,Th,Sa-HD   pantoprazole  40 mg Oral BID AC   polyethylene glycol  17 g Oral BID   ramelteon  8 mg Oral QHS   senna-docusate  2 tablet Oral QHS  sevelamer carbonate  1,600 mg Oral TID WC   venlafaxine XR  75 mg Oral Q breakfast    Dialysis Orders: [facility] TTS  4hr, 400/800, EDW 91.5kg, 2K/2Ca, TDC, heparin 4000U bolus  - Mircera 75 mcg IV q 2 weeks  - last 6/8 - Calcitriol 0.5 three times per week  Assessment/Plan: LLE wound infection: B Cx neg. S/p OR debridement/wound vac on 6/21. Wound Cx grew Proteus + Klebsiella + Enterococcus:  IV antibiotics dose has been completed . ESRD: Usual TTS schedule. "Extra" HD 7/1 due to dyspnea/orthopnea, then back to usual schedule.   Next HD tomorrow Thrombosed AVF - not salvageable, TDC in use. For new access in near future, already has appt. Hypotension/volume: BP stable- gets midodrine pre-HD. 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  Metabolic bone disease: CorrCa up slightly, phos in  goal. continue Renvela as binder. VDRA on hold.   Nutrition: Alb low, continue supps. AFib - on amiodarone/Eliquis  T2DM - historically poorly controlled. Insulin per primary AMS/Twitching - felt secondary to Lyrica toxicity in ESRD. Resolved after discontinuing medication.  Dispo: In CIR. Constipation nausea-add as needed sorbitol to MiraLAX and stool softener  Anthony Sar, MD [facility]

## 2022-12-10 NOTE — Patient Care Conference (Signed)
Inpatient RehabilitationTeam Conference and Plan of Care Update Date: 12/10/2022   Time: 12:23 PM    Patient Name: Susan [name] [facility]      Medical Record Number: [id number]  Date of Birth: 09/04/1950 Sex: Female         Room/Bed: 4M08C/4M08C-01 Payor Info: Payor: [employer] ADVANTAGE / Plan: [employer] PPO / Product Type: *No Product type* /    Admit Date/Time:  11/20/2022  5:28 PM  Primary Diagnosis:  Right below-knee amputee [facility])  Hospital Problems: Principal Problem:   Right below-knee amputee [facility]) Active Problems:   Acute on chronic anemia    Expected Discharge Date: Expected Discharge Date: 12/12/22  Team Members Present: Physician leading conference: Dr. Fanny Dance Social Worker Present: Dossie Der, LCSW Nurse Present: Chana Bode, RN PT Present: Raechel Chute, PT OT Present: Lou Cal, OT PPS Coordinator present : Fae Pippin, SLP     Current Status/Progress Goal Weekly Team Focus  Bowel/Bladder   Continenet to b/B [facility] 7/16 xray shows moderate amounts of stool in colon, pt having small bm refused Enema 7/16 due to dailysis schedule   large bowel movements   education on bowel management    Swallow/Nutrition/ Hydration               ADL's   Supervision for grooming at sink-side, setup for UB dressing, Max-Mod A for LB self care, CGA-Min A for SB transfers towards R-side   S UB, min A LB, mod A slide board transfers to [facility]   Caregiver education in preparation for discharge 7/19    Mobility   rolling mod I, supine<>sit min A, sit<>supine supervision, slideboard transfers min A, squat<>pivot mod +2/max A of 1, sit<>stands in // bars dependent, WC mobility 112ft min A   min-mod A chair level, sit to stand with max A  barriers: car transfers/transportation, pain    Communication                Safety/Cognition/ Behavioral Observations               Pain   pt denies pain   remain free from pain   assess every shift and  prn    Skin   left leg wound   wound to show signs of healing  assess every shift and prn      Discharge Planning:  Family education scheduled with husand for tomorrow aware can not get into a SUV would need car. Pt anxious regarding prothetic and using. Made progress with transfers while here. Husband aware will still require 24/7 care at discharge. DME arranged and HH in place   Team Discussion: Patient with right BKA and wound on left LE; limited by muscle cramps, anxiety and self limiting behaviors. Muscle cramps difficult to treat with ESRD.  Patient on target to meet rehab goals: yes, currently needs supervision for upper body ADLs and dressing edge of the bed. Able to complete lateral leans with mod assist for pericare/hygiene but limited transfers to [facility] due to fear.  Able to complete bed mobility with mod I assist and sit to supine, supine to sit with min mod assist but is dependent for sit - stand  due to ill fitting prosthesis.  Propels the w/c up to 100' with min assist. Goals for discharge set for min assist overall.   *See Care Plan and progress notes for long and short-term goals.   Revisions to Treatment Plan:  Car transfer practice but has a high SUV Prosthetist referral  for right BKA   Teaching Needs: Safety, wound care/skin care, medications, dietary modifications, insulin administration, transfers, toileting, etc.  Current Barriers to Discharge: Decreased caregiver support, Home enviroment access/layout, Hemodialysis, and Behavior  Possible Resolutions to Barriers: Family education scheduled 12/11/22 HH follow up including nursing for skin care DME: slide-board     Medical Summary Current Status: right BKA revision 1/24 and left leg wound, Muscle cramps, ESRD, DM2, PAF, constipation    Barriers to Discharge Comments: wound care, poor car transfers, DM medication adjustments, constipation Possible Resolutions to [employer]: work on Programme researcher, broadcasting/film/video  transportation options, enema today, consider increase robaxin, adjust insulin dose as needed, wound care   Continued Need for Acute Rehabilitation Level of Care: The patient requires daily medical management by a physician with specialized training in physical medicine and rehabilitation for the following reasons: Direction of a multidisciplinary physical rehabilitation program to maximize functional independence : Yes Medical management of patient stability for increased activity during participation in an intensive rehabilitation regime.: Yes Analysis of laboratory values and/or radiology reports with any subsequent need for medication adjustment and/or medical intervention. : Yes   I attest that I was present, lead the team conference, and concur with the assessment and plan of the team.   Chana Bode B 12/10/2022, 1:33 PM

## 2022-12-10 NOTE — Progress Notes (Signed)
Physical Therapy Session Note  Patient Details  Name: Susan [name] MRN: [id number] Date of Birth: 1950-10-31  Today's Date: 12/10/2022 PT Individual Time: 4098-1191 PT Individual Time Calculation (min): 57 min   Short Term Goals: Week 1:  PT Short Term Goal 1 (Week 1): Pt will perform rolling in bed to either side with MinA. PT Short Term Goal 1 - Progress (Week 1): Met PT Short Term Goal 2 (Week 1): Pt will perform supine <> sit with overall ModA. PT Short Term Goal 2 - Progress (Week 1): Met PT Short Term Goal 3 (Week 1): Pt will perform sit<>stand with MaxA +2 and tolerate stance for >20sec. PT Short Term Goal 3 - Progress (Week 1): Progressing toward goal PT Short Term Goal 4 (Week 1): Pt will initiate slide board transfer training. PT Short Term Goal 4 - Progress (Week 1): Met PT Short Term Goal 5 (Week 1): Pt will complete FIST for outcome measure. PT Short Term Goal 5 - Progress (Week 1): Met Week 2:  PT Short Term Goal 1 (Week 2): Pt will initiate sit<>stand with R LE prosthetic with LRAD and +2 max A PT Short Term Goal 1 - Progress (Week 2): Met PT Short Term Goal 2 (Week 2): Pt will initiate squat pivot transfer for pt transfer into [employer] 4 runner PT Short Term Goal 2 - Progress (Week 2): Met Week 3:  PT Short Term Goal 1 (Week 3): STG=LTG 2/2 ELOS  Skilled Therapeutic Interventions/Progress Updates:  Patient supine in bed on entrance to room. Patient alert and agreeable to PT session. She is having low level pain in LLE and is very nervous re: return home. Pt provided with therapeutic listening and support. Addressed concerns with slide board and need for longer board for eventual slide board into car. Addressed need for lower car.   Discussed need for continued therapy and pushing self to continue progressing toward walking with prosthetic. Takes time to fit socket to pt especially when she has not put pressure on R residual limb in many months. Will need to leave  prosthetic on daily for 1-2 hrs at a time to become accustomed to fit and progress pressure to standing.   Assessed BLE strength, sensation and provided recommendations for supine therex to continue to stretch and strengthen to continue to progress toward stance and ambulation with prosthetic.   Manual therapy: Pt demos adhesions and trigger points along L thigh and provided with STM, CFM, and light pressure holds for relief.   Patient supine at end of session with brakes locked, bed alarm set, and all needs within reach.   Therapy Documentation Precautions:  Precautions Precautions: Fall Precaution Comments: L lateral lower leg wound, R rotator cuff tear. Demo prosthetic for RLE Restrictions Weight Bearing Restrictions: Yes LLE Weight Bearing: Weight bearing as tolerated General:   Pain:  Low level pain addressed throughout session with mild manual therapy.   Therapy/Group: Individual Therapy  Loel Dubonnet PT, DPT, CSRS 12/10/2022, 6:15 PM

## 2022-12-10 NOTE — Progress Notes (Addendum)
Physical Therapy Discharge Summary  Patient Details  Name: Susan [name] MRN: [id number] Date of Birth: 1951-05-26  Date of Discharge from PT service:December 11, 2022  Patient has met 4 of 9 long term goals due to improved activity tolerance, improved balance, improved postural control, increased strength, increased range of motion, decreased pain, ability to compensate for deficits, improved attention, improved awareness, and improved coordination. Patient to discharge at a wheelchair level Supervision- min A. Patient's care partner is independent to provide the necessary physical assistance at discharge. Pt's husband attended family education training on 7/18 and verbalized and demonstrated confidence with slideboard transfers to ensure safe discharge home. Plan to use private transportation initially with ultimate goal of getting WC accessible van.   Reasons goals not met: Pt did not meet dynamic sitting goal of independent, as pt current requires supervision due to generalize weakness. Pt did not meet sit<>stand or dynamic standing goal of max A as pt is currently dependent to stand due to pain, weakness/deconditioning, and decreased balance proprioception. Pt did not meet bed mobility goal of supervision, as pt currently requires min A. Pt did not meet car transfer goal of mod A due to safety concerns using slideboard to enter 30in high car seat.   Recommendation:  Patient will benefit from ongoing skilled PT services in home health setting to continue to advance safe functional mobility, address ongoing impairments in transfers, generalized strengthening and endurance, prosthetic management, dynamic standing balance/coordination, WC mobility, and to minimize fall risk.  Equipment: Slideboard, pt already has WC  Reasons for discharge: treatment goals met and discharge from hospital  Patient/family agrees with progress made and goals achieved: Yes  PT  Discharge Precautions/Restrictions Precautions Precautions: Fall Precaution Comments: L lateral lower leg wound, R rotator cuff tear. Demo prosthetic for RLE Restrictions Weight Bearing Restrictions: Yes LLE Weight Bearing: Weight bearing as tolerated Pain Interference Pain Interference Pain Effect on Sleep: 2. Occasionally Pain Interference with Therapy Activities: 2. Occasionally Pain Interference with Day-to-Day Activities: 2. Occasionally Cognition Overall Cognitive Status: Within Functional Limits for tasks assessed Arousal/Alertness: Awake/alert Orientation Level: Oriented X4 Memory: Impaired Awareness: Appears intact Problem Solving: Impaired Safety/Judgment: Appears intact Sensation Sensation Light Touch: Impaired by gross assessment (stocking/ glove neuropathy) Hot/Cold: Impaired by gross assessment Proprioception: Impaired by gross assessment Coordination Gross Motor Movements are Fluid and Coordinated: No Fine Motor Movements are Fluid and Coordinated: No Coordination and Movement Description: Improved but continuing pain following prolonged bed rest and debility, Bil hand/foot neuropathy, large wound on LLE affecting overall pain 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/ weakness Motor  Motor Motor: Other (comment) Motor - Discharge Observations: continued deficits 2/2 prolonged debility and pain, difficulty with new prosthetic  Mobility Bed Mobility Bed Mobility: Rolling Right;Rolling Left;Sit to Supine;Supine to Sit Rolling Right: Independent with assistive device Rolling Left: Independent with assistive device Supine to Sit: Minimal Assistance - Patient > 75% (using bedrails) Sit to Supine: Supervision/Verbal cueing Transfers Transfers: Sit to [employer];Lateral/Scoot Transfers Sit to Stand: Dependent - Patient 0% Squat Pivot Transfers: Moderate Assistance - Patient 50-74% Lateral/Scoot Transfers: Supervision/Verbal  cueing Transfer (Assistive device): Other (Comment) (slideboard) Locomotion  Gait Ambulation: No Gait Gait: No Stairs / Additional Locomotion Stairs: No Wheelchair Mobility Wheelchair Mobility: Yes Wheelchair Assistance: Minimal assistance - Patient >75% Wheelchair Propulsion: Both upper extremities Wheelchair Parts Management: Needs assistance Distance: 160ft  Trunk/Postural Assessment  Cervical Assessment Cervical Assessment: Within Functional Limits Thoracic Assessment Thoracic Assessment: Exceptions to [facility] (thoracic rounding) Lumbar Assessment Lumbar  Assessment: Exceptions to [facility] (posterior pelvic tilt) Postural Control Postural Control: Deficits on evaluation Protective Responses: delayed  Balance Balance Balance Assessed: Yes Static Sitting Balance Static Sitting - Balance Support: Feet supported;Bilateral upper extremity supported Static Sitting - Level of Assistance: 6: Modified independent (Device/Increase time) Dynamic Sitting Balance Dynamic Sitting - Balance Support: Feet supported;During functional activity;No upper extremity supported Dynamic Sitting - Level of Assistance: 5: Stand by assistance (supervision) Static Standing Balance Static Standing - Balance Support: Bilateral upper extremity supported;During functional activity (// bars) Static Standing - Level of Assistance: 1: +2 Total assist Extremity Assessment  RLE Assessment RLE Assessment: Exceptions to [facility] RLE Strength Right Hip Flexion: 4/5 Right Hip Extension: 4-/5 Right Hip ABduction: 4/5 Right Hip ADduction: 4/5 Right Knee Flexion: 4/5 Right Knee Extension: 3+/5 LLE Assessment LLE Assessment: Exceptions to [facility] LLE Strength Left Hip Flexion: 3-/5 Left Hip Extension: 3/5 Left Hip ABduction: 3+/5 Left Hip ADduction: 4-/5 Left Knee Flexion: 3+/5 Left Knee Extension: 4-/5 Left Ankle Dorsiflexion: 4-/5 Left Ankle Plantar Flexion: 4-/5  Susan [name] PT, DPT 12/10/2022,  11:14 AM

## 2022-12-10 NOTE — Progress Notes (Signed)
Physical Therapy Session Note  Patient Details  Name: Susan [name] MRN: [id number] Date of Birth: Feb 24, 1951  Today's Date: 12/10/2022 PT Individual Time: 4166-0630 PT Individual Time Calculation (min): 56 min   Short Term Goals: Week 2:  PT Short Term Goal 1 (Week 2): Pt will initiate sit<>stand with R LE prosthetic with LRAD and +2 max A PT Short Term Goal 1 - Progress (Week 2): Met PT Short Term Goal 2 (Week 2): Pt will initiate squat pivot transfer for pt transfer into [employer] 4 runner PT Short Term Goal 2 - Progress (Week 2): Met Week 3:  PT Short Term Goal 1 (Week 3): STG=LTG 2/2 ELOS  Skilled Therapeutic Interventions/Progress Updates:   Received pt semi-reclined in bed with MD present for morning rounds. Pt agreeable to PT treatment and reported pain and cramping rated 8/10 in LLE (premedicated). Session with emphasis on discharge planning, functional mobility/transfers, generalized strengthening and endurance, and dressing. Pt on 2L O2 with SPO2 96% - decreased to RA and SPO2 remained WFL. Pt rolled L/R with supervision and use of bedrails and removed soiled brief and donned clean one with max A. Donned pants with max A and rolled L/R and dependent to pull pants over hips. Pt transferred semi-reclined<>sitting EOB with HOB elevated and use of bedrails with mod A for trunk control due to pain. Pt required increased time, but able to scoot to EOB without assist. Pt reports having manual hand-crank adjustable bed at home.   Discussed primary concern for discharge - car transfers. Pt's car seat height is 30in which does not allow for slideboard transfers and pt currently requires mod of 2/max of 1 for squat<>pivot transfers - therefore do not recommend pt/husband use this transfer method. Recommended trying to borrow/use a lower seat height car or use private transportation services (pt was using prior to admission). Pt reports her husband is hard headed and more concerned about financial  requirements of using private transportation and thinks he can do more than he actually is able to. Pt expressed concerns about her safety and her husband's ability to provide max A to transfer her in/out of car - will continue to address with husband in family ed tomorrow.   Doffed gown and donned pull over shirt with supervision. Pt required mod A overall when donning prosthetic - able to don black residual limb sock without assist, gel liner with min A, blue and green prosthetic socks with min A, and required max A to align prosthetic with pin and click in - able to recall correct sequence. Donned L sock and shoe with max A. Pt transferred bed<>WC to L with light mod A and total A to place slideboard. Pt required cues for head/hips relationship, foot placement, and anterior weight shifting with transfer. Discussed that slideboard transfers are currently the safest transfer method at this time and pt in agreement. Concluded session with pt sitting in WC, needs within reach, and seatbelt alarm on.   Therapy Documentation Precautions:  Precautions Precautions: Fall Precaution Comments: L lateral lower leg open wound, R rotator cuff tears, Restrictions Weight Bearing Restrictions: Yes LLE Weight Bearing: Weight bearing as tolerated  Therapy/Group: Individual Therapy [employer] Zaunegger Blima Rich PT, DPT 12/10/2022, 7:13 AM

## 2022-12-10 NOTE — Progress Notes (Addendum)
PROGRESS NOTE   Subjective/Complaints:  Pt reports some muscle cramps in her thighs this AM. She reports she had some small Bms yesterday so decided not to do enama. Still feels constipated today.  Pt reports Dr. Lajoyce Corners evaluated wound this AM.   ROS: Patient denies fever, rash, sore throat, blurred vision, dizziness, vomiting, diarrhea, cough, Abdominal pain, shortness of breath or chest pain,  headache + Nausea-improved, + constipation, + msucle cramps  Objective:   DG Abd 2 Views  Result Date: 12/08/2022 CLINICAL DATA:  Constipation. EXAM: ABDOMEN - 2 VIEW COMPARISON:  06/12/2022. FINDINGS: The bowel gas pattern is normal. There is no evidence of free air. A moderate amount of retained stool is present in the colon and rectum. No radio-opaque calculi. Vascular calcifications are noted in the pelvis and bilateral lower extremities. IMPRESSION: Moderate amount of retained stool in the colon and rectum. Electronically Signed   By: Thornell Sartorius M.D.   On: 12/08/2022 21:13   No results for input(s): "WBC", "HGB", "HCT", "PLT" in the last 72 hours.    No results for input(s): "NA", "K", "CL", "CO2", "GLUCOSE", "BUN", "CREATININE", "CALCIUM" in the last 72 hours.     Intake/Output Summary (Last 24 hours) at 12/10/2022 0827 Last data filed at 12/10/2022 0716 Gross per 24 hour  Intake 476 ml  Output 3000 ml  Net -2524 ml     Pressure Injury 08/27/22 Coccyx Mid Stage 2 -  Partial thickness loss of dermis presenting as a shallow open injury with a red, pink wound bed without slough. 56mmx5mmx0 red center unattached edges (Active)  08/27/22 1000  Location: Coccyx  Location Orientation: Mid  Staging: Stage 2 -  Partial thickness loss of dermis presenting as a shallow open injury with a red, pink wound bed without slough.  Wound Description (Comments): 4mmx5mmx0 red center unattached edges  Present on Admission:     Physical  Exam: Vital Signs Blood pressure (!) 153/71, pulse 78, temperature 97.7 F (36.5 C), temperature source Oral, resp. rate 16, height 5\' 5"  (1.651 m), weight 86.6 kg, SpO2 100%.  Constitutional: No distress . Vital signs reviewed. Lying in bed, therapy in room HEENT: NCAT, EOMI, oral membranes moist Neck: supple Cardiovascular: RRR without murmur. No JVD    Respiratory/Chest: CTA Bilaterally without wheezes or rales. Normal effort    GI/Abdomen: BS +, non-tender, mildly-distended, soft Ext: no clubbing, cyanosis, or edema Psych: remains anxious but cooperative  Skin:  + Right BKA wound intact.  Wearing limb protector  Left calf wound  covered in dry dressings.    Neurologic exam: Alert and awake, decreased sensation below distal thighs, cranial nerves II through XII grossly intact Right IJ TDC in place MSK: No abnormal tone noted      + right shoulder rom still somewhat limited Pt reports soreness generalized with palpation and ROM LLE     Assessment/Plan: 1. Functional deficits which require 3+ hours per day of interdisciplinary therapy in a comprehensive inpatient rehab setting. Physiatrist is providing close team supervision and 24 hour management of active medical problems listed below. Physiatrist and rehab team continue to assess barriers to discharge/monitor patient progress toward functional and medical goals  Care Tool:  Bathing    Body parts bathed by patient: Right arm, Left arm, Chest, Face   Body parts bathed by helper: Buttocks, Front perineal area, Abdomen, Right upper leg, Left upper leg Body parts n/a: Right lower leg, Left lower leg   Bathing assist Assist Level: Dependent - Patient 0%     Upper Body Dressing/Undressing Upper body dressing   What is the patient wearing?: Hospital gown only    Upper body assist Assist Level: Maximal Assistance - Patient 25 - 49%    Lower Body Dressing/Undressing Lower body dressing      What is the patient wearing?:  Incontinence brief, Hospital gown only, Ace wrap/stump shrinker     Lower body assist Assist for lower body dressing: Dependent - Patient 0%     Toileting Toileting    Toileting assist Assist for toileting: Total Assistance - Patient < 25%     Transfers Chair/bed transfer  Transfers assist  Chair/bed transfer activity did not occur: Safety/medical concerns  Chair/bed transfer assist level: Supervision/Verbal cueing (slide board transfer)     Locomotion Ambulation   Ambulation assist   Ambulation activity did not occur: Safety/medical concerns          Walk 10 feet activity   Assist  Walk 10 feet activity did not occur: Safety/medical concerns        Walk 50 feet activity   Assist Walk 50 feet with 2 turns activity did not occur: Safety/medical concerns         Walk 150 feet activity   Assist Walk 150 feet activity did not occur: Safety/medical concerns         Walk 10 feet on uneven surface  activity   Assist Walk 10 feet on uneven surfaces activity did not occur: Safety/medical concerns         Wheelchair     Assist Is the patient using a wheelchair?: Yes Type of Wheelchair: Manual    Wheelchair assist level: Minimal Assistance - Patient > 75% Max wheelchair distance: 70    Wheelchair 50 feet with 2 turns activity    Assist        Assist Level: Minimal Assistance - Patient > 75%   Wheelchair 150 feet activity     Assist      Assist Level: Total Assistance - Patient < 25%   Blood pressure (!) 153/71, pulse 78, temperature 97.7 F (36.5 C), temperature source Oral, resp. rate 16, height 5\' 5"  (1.651 m), weight 86.6 kg, SpO2 100%.  Medical Problem List and Plan: 1. Functional deficits secondary to right BKA revision 1/24 and left leg wound             -patient may shower with wound covered             -ELOS/Goals: 12/12/22, min to mod assist PT/OT -Continue CIR therapies including PT, OT  -had discussion  with pt that she has to try to work through anxiety and nausea if she wants to get home. -Team conference today please see physician documentation under team conference tab, met with team  to discuss problems,progress, and goals. Formulized individual treatment plan based on medical history, underlying problem and comorbidities.   2.  Antithrombotics: -DVT/anticoagulation:  Pharmaceutical: Eliquis 5mg  BID             -antiplatelet therapy: N/A 3. Pain Management:  Tylenol, Robaxin, and Oxycodone prn.  -Patient had altered mental status and twitching suspected due to Lyrica toxicity prior to admission  to CIR.  Lyrica was discontinued. -7/2 patient has been avoiding trying oxycodone due to constipation, discussed that we can give additional laxatives with this if needed.  We could try low-dose of Lyrica for phantom pain.  She is currently on Effexor. -7/3 will adjust oxycodone to 2.5 to 5 mg PRN -7/4 doing better with 2.5 oxycodone and robaxin PRN, she reports minimal benefit to phantom pain with gabapentin/lyrica in the past -7/5 continue oxycodone 2.5 mg as needed and Robaxin as needed 7/8  Robaxin --increased to 750mg  q6 prn---continue -7/17 consider increase robaxin dose if muscle cramps continue to be issue, caution with new muscle relaxers due to ESRD 4. Mood/Behavior/Sleep/Anxiety disorder: LCSW to follow for evaluation and support.  --chronic insomnia--has Ambien prn w/ Ramelteron 8mg  QHS, and Lorazepam. -continue melatonin 7/14 anxiety still an issue. Is sleeping better, however. Hesitate to increase klonopin further given fatigue             -antipsychotic agents: N/A 5. Neuropsych/cognition: This patient is capable of making decisions on her own behalf.   6. Skin/Wound Care: Monitor wound for healing.  --Wound VAC d/c due to concerns of wound getting macerated per Dr. Lajoyce Corners.  --Dry dressing changes and to add silvadene to wound bed if wound starts getting dry.  -6/28 VAC was removed  last night Dr. Lajoyce Corners advises to "Apply small amount of silvadine pulse gauze and ace wrap. Change daily " -11/22/22 yellowish slough/drainage from wound bed, contacted Dr. Lajoyce Corners regarding this, will see if he wants BID dressing changes-- still pending response. Will f/up tomorrow. For now, doesn't look like worsening infection, can monitor closely. Will undress wound and re-photograph tomorrow -11/23/22 wound looks about the same or maybe a little better today, Dr. Lajoyce Corners apparently out of town per patient, will change to BID silvadene and dressing changes for now, to have closer eyes on wound, but may want to touch base with Dr. Lajoyce Corners this week to check if he would like to adjust anything.  -7/3 PA with ortho evaluated her wound today, recommended just dry dressings for now, appreciate assistance -7/13 continue left leg wound dressing with aquacel with 4x4 on 7/11.  WBAT LLE 7/14 aquacel dressing needs to be changed daily! Twice daily if needed 7. Fluids/Electrolytes/Nutrition:  Strict I/O with 1200 cc FR. Monitor intake. Continue supplements/vitamins.  8. Kleb/Proteus/Enterococcus wound infection: Cefazolin for 2 weeks completed 9.  T2DM: Hgb A1c-7.9 08/27/22 -Monitor BS ac/hs and use SSI for elevated BS. Titrate basal insulin as indicated.  -6/28 she is on Semglee 40 units in the morning and 20 units at bedtime.  Increase bedtime Semglee to 22 units --7/4 decrease Semglee to 38 units in AM, 16 in afternoon due to hypoglycemia event -7/5 Cbgs controlled, continue to monitor  -11/29/22 another hypoglycemic event this morning, mostly <200 during day, will further decrease Semglee to 36U qAM + 14U qPM to see if we have fewer morning lows-- monitor -11/30/22 CBG high this morning but apparently didn't get her AM dose of insulin yesterday-- so today was the first day of the reduced dose. Will have to monitor trend. Of note, pharmacy states her dose PTA was 45U daily, not BID dosing 7/12 semglee is 38u and  18u---persistent elevation  -adjust to 40 and 20 u today 7/15 will consult DM coordinator, Glucose readings have been labile 7/16 DM coordinator advised adding NovoLog meal coverage 3 units 3 times daily with meals and reducing p.m. Semglee to 12 units every afternoon, orders placed made will monitor  for response 7/17 CBGs doing much better today, appreciate DM coordinator assistance  CBG (last 3)  Recent Labs    12/09/22 2054 12/09/22 2211 12/10/22 0620  GLUCAP 127* 195* 125*   7/14--continue current insulin given her reports of nausea, cover with SSI  10. CAD/biventricular CHF: On GDMT limited due to hypotension.   --Continue Lipitor 80mg  QD   -7/17 wts stable, monitor Filed Weights   12/06/22 1518 12/06/22 1919 12/09/22 1507  Weight: 86.8 kg 83.8 kg 86.6 kg    11. ESRD: HD TTS at the end of the day to help with tolerance of therapy.  Thrombosed AVF not salvageable.                --has been on regular diet-->? Low salt restrictions.   -6/28 nephrology following appreciate assistance, next HD tomorrow  -7/1 HD plans HD today due to orthopnea + SOB -7/2 patient has scheduled HD session again today, hopefully will have benefit to orthopnea although this may be anxiety related also  -7/12 volume mgt per nephrology  12. Depression with anxiety d/o, panic attacks: Now on Venlafaxine XR 75mg  QD with Lorazepam at night.--lorazepam not ordered, will reorder 0.5mg  QHS PRN, changed to scheduled starting 11/23/22 as mentioned above.   13. PAF: On Eliquis and amiodarone 200mg  QD for rate control.   -7/16 heart rate stable in the 70s and 80s     12/10/2022    4:30 AM 12/09/2022    8:01 PM 12/09/2022    7:31 PM  Vitals with BMI  Systolic 153 135 253  Diastolic 71 64 45  Pulse 78 74 74    14. Anemia of chronic disease:  -11/22/22 Hgb stable at 8.2 today, monitor labs  -7/2 nephrology ordering Aranesp 15.   H/o GIB due to stercoral ulcer: Avoid constipation. Continue Protonix 40mg  BID,  miralax QD , Senokot 2 tabs BID, and PRNs.   -11/23/22 last BM yesterday, monitor  -7/1 Small BM yesterday, increase miralax to BID for constipation   -7/2 last BM yesterday although small, continue sorbitol as needed  -7/3 sorbitol 45ml ordered  -7/5 LBM today, brown, changed miralax from BID to daily  -12/04/22   LBM  --needs bm today  -will order sorbitol 45ml, check Xray abdomen, PRN enema   -7/16 x-ray yesterday with moderate stool, she has had a few small bowel movements, will order enema for today after dialysis, increase miralax to BID'  -7/17 pt declines enema yesterday but plans to do today for constipation 16. Morbid Obesity. Dietary education  -Body mass index is 31.77 kg/m. 17.  Hypotension  -Taking midodrine 10mg  prior to dialysis  -6/29-30/24 BPs stable, monitor -7/2 consider more regular treatment of midodrine if orthostatic symptoms become limiting  -7/4-6 stable today, continue to monitor  -7/12--continue midodrine, volume mgt per renal  7/17 stable overall, continue current medications Vitals:   12/09/22 1530 12/09/22 1600 12/09/22 1630 12/09/22 1700  BP: (!) 118/90 124/73 (!) 132/55 128/61   12/09/22 1730 12/09/22 1800 12/09/22 1830 12/09/22 1900  BP: (!) 135/56 134/62 (!) 114/53 (!) 123/36   12/09/22 1921 12/09/22 1931 12/09/22 2001 12/10/22 0430  BP: (!) 93/50 (!) 123/45 135/64 (!) 153/71    18. Hypothyroidism: continue synthroid qAM 19. Oxygen dependence/chronic respiratory failure: pt states she uses PRN O2 at home, mostly related to anxiety; ordered O2 2L via [place] to maintain SpO2 >90%. Pt with clear lung sounds, doubt need for emergent work up today. Monitor  -Okay to trial  increasing time off nasal cannula O2, provide reassurance  -off O2 as tolerated, anxiety component  20. Nausea: unclear etiology -recurrent  -has reglan and compazine prn  -abdomen benign appearing  -amiodarone effect but this is chronic med  -suspect anxiety component  -7/14 will  change reglan to 5mg  tid scheduled   -hold rena-vit for now   -request input from nephrology regarding potential renal source   -ego support/manage anxiety  -check abdominal xray- increase stool   LOS: 20 days A FACE TO FACE EVALUATION WAS PERFORMED  Fanny Dance 12/10/2022, 8:27 AM

## 2022-12-11 ENCOUNTER — Telehealth: Payer: Self-pay | Admitting: Orthopedic Surgery

## 2022-12-11 ENCOUNTER — Other Ambulatory Visit (HOSPITAL_COMMUNITY): Payer: Self-pay

## 2022-12-11 ENCOUNTER — Other Ambulatory Visit: Payer: Self-pay | Admitting: *Deleted

## 2022-12-11 DIAGNOSIS — N186 End stage renal disease: Secondary | ICD-10-CM

## 2022-12-11 DIAGNOSIS — R252 Cramp and spasm: Secondary | ICD-10-CM

## 2022-12-11 DIAGNOSIS — E119 Type 2 diabetes mellitus without complications: Secondary | ICD-10-CM

## 2022-12-11 LAB — CBC
HCT: 39.2 % (ref 36.0–46.0)
Hemoglobin: 11.9 g/dL — ABNORMAL LOW (ref 12.0–15.0)
MCH: 23.7 pg — ABNORMAL LOW (ref 26.0–34.0)
MCHC: 30.4 g/dL (ref 30.0–36.0)
MCV: 78.1 fL — ABNORMAL LOW (ref 80.0–100.0)
Platelets: 255 10*3/uL (ref 150–400)
RBC: 5.02 MIL/uL (ref 3.87–5.11)
RDW: 17.8 % — ABNORMAL HIGH (ref 11.5–15.5)
WBC: 5.9 10*3/uL (ref 4.0–10.5)
nRBC: 0 % (ref 0.0–0.2)

## 2022-12-11 LAB — RENAL FUNCTION PANEL
Albumin: 3.2 g/dL — ABNORMAL LOW (ref 3.5–5.0)
Anion gap: 13 (ref 5–15)
BUN: 69 mg/dL — ABNORMAL HIGH (ref 8–23)
CO2: 24 mmol/L (ref 22–32)
Calcium: 9.6 mg/dL (ref 8.9–10.3)
Chloride: 96 mmol/L — ABNORMAL LOW (ref 98–111)
Creatinine, Ser: 4.98 mg/dL — ABNORMAL HIGH (ref 0.44–1.00)
GFR, Estimated: 9 mL/min — ABNORMAL LOW (ref >60)
Glucose, Bld: 83 mg/dL (ref 70–99)
Phosphorus: 3.8 mg/dL (ref 2.5–4.6)
Potassium: 4.2 mmol/L (ref 3.5–5.1)
Sodium: 133 mmol/L — ABNORMAL LOW (ref 135–145)

## 2022-12-11 LAB — GLUCOSE, CAPILLARY
Glucose-Capillary: 83 mg/dL (ref 70–99)
Glucose-Capillary: 181 mg/dL — ABNORMAL HIGH (ref 70–99)

## 2022-12-11 MED ORDER — APIXABAN 5 MG PO TABS
5.0000 mg | ORAL_TABLET | Freq: Two times a day (BID) | ORAL | 0 refills | Status: DC
Start: 2022-12-11 — End: 2023-03-27
  Filled 2022-12-11: qty 60, 30d supply, fill #0

## 2022-12-11 MED ORDER — AMIODARONE HCL 200 MG PO TABS
200.0000 mg | ORAL_TABLET | Freq: Every day | ORAL | 0 refills | Status: DC
  Filled 2022-12-11: qty 30, 30d supply, fill #0

## 2022-12-11 MED ORDER — SEVELAMER CARBONATE 800 MG PO TABS
1600.0000 mg | ORAL_TABLET | Freq: Three times a day (TID) | ORAL | 0 refills | Status: DC
  Filled 2022-12-11: qty 180, 30d supply, fill #0

## 2022-12-11 MED ORDER — RAMELTEON 8 MG PO TABS
8.0000 mg | ORAL_TABLET | Freq: Every day | ORAL | 0 refills | Status: DC
Start: 2022-12-11 — End: 2023-01-19
  Filled 2022-12-11: qty 30, 30d supply, fill #0

## 2022-12-11 MED ORDER — METOCLOPRAMIDE HCL 5 MG PO TABS
5.0000 mg | ORAL_TABLET | Freq: Three times a day (TID) | ORAL | 0 refills | Status: DC
  Filled 2022-12-11: qty 90, 30d supply, fill #0

## 2022-12-11 MED ORDER — ALBUTEROL SULFATE HFA 108 (90 BASE) MCG/ACT IN AERS
1.0000 | INHALATION_SPRAY | Freq: Four times a day (QID) | RESPIRATORY_TRACT | 0 refills | Status: DC | PRN
  Filled 2022-12-11: qty 6.7, 30d supply, fill #0

## 2022-12-11 MED ORDER — PANTOPRAZOLE SODIUM 40 MG PO TBEC
40.0000 mg | DELAYED_RELEASE_TABLET | Freq: Two times a day (BID) | ORAL | 0 refills | Status: DC
  Filled 2022-12-11: qty 60, 30d supply, fill #0

## 2022-12-11 MED ORDER — MIDODRINE HCL 10 MG PO TABS
10.0000 mg | ORAL_TABLET | ORAL | 0 refills | Status: DC
  Filled 2022-12-11: qty 30, 70d supply, fill #0

## 2022-12-11 MED ORDER — ASCORBIC ACID 500 MG PO TABS
500.0000 mg | ORAL_TABLET | Freq: Every day | ORAL | 0 refills | Status: DC
  Filled 2022-12-11: qty 100, 100d supply, fill #0

## 2022-12-11 MED ORDER — HEPARIN SODIUM (PORCINE) 1000 UNIT/ML DIALYSIS
3800.0000 [IU] | INTRAMUSCULAR | Status: DC | PRN
  Administered 2022-12-11: 3800 [IU]

## 2022-12-11 MED ORDER — VENLAFAXINE HCL ER 75 MG PO CP24
75.0000 mg | ORAL_CAPSULE | Freq: Every day | ORAL | 0 refills | Status: DC
  Filled 2022-12-11: qty 30, 30d supply, fill #0

## 2022-12-11 MED ORDER — INSULIN GLARGINE 100 UNIT/ML SOLOSTAR PEN
12.0000 [IU] | PEN_INJECTOR | Freq: Every day | SUBCUTANEOUS | 11 refills | Status: DC
Start: 2022-12-11 — End: 2023-01-17
  Filled 2022-12-11: qty 15, 125d supply, fill #0

## 2022-12-11 MED ORDER — METHOCARBAMOL 500 MG PO TABS: 1000.0000 mg | ORAL_TABLET | Freq: Four times a day (QID) | ORAL | Status: DC | PRN

## 2022-12-11 MED ORDER — CLONAZEPAM 0.5 MG PO TBDP
0.5000 mg | ORAL_TABLET | Freq: Two times a day (BID) | ORAL | 0 refills | Status: DC
  Filled 2022-12-11: qty 60, 30d supply, fill #0

## 2022-12-11 MED ORDER — LEVOTHYROXINE SODIUM 75 MCG PO TABS
75.0000 ug | ORAL_TABLET | Freq: Every day | ORAL | 0 refills | Status: DC
  Filled 2022-12-11: qty 30, 30d supply, fill #0

## 2022-12-11 MED ORDER — INSULIN GLARGINE 100 UNIT/ML SOLOSTAR PEN
40.0000 [IU] | PEN_INJECTOR | Freq: Every day | SUBCUTANEOUS | 11 refills | Status: DC
  Filled 2022-12-11: qty 30, 75d supply, fill #0

## 2022-12-11 MED ORDER — ATORVASTATIN CALCIUM 80 MG PO TABS
80.0000 mg | ORAL_TABLET | Freq: Every day | ORAL | 0 refills | Status: DC
  Filled 2022-12-11: qty 30, 30d supply, fill #0

## 2022-12-11 MED ORDER — ZINC SULFATE 220 (50 ZN) MG PO TABS
220.0000 mg | ORAL_TABLET | Freq: Every day | ORAL | 0 refills | Status: DC
  Filled 2022-12-11: qty 100, 100d supply, fill #0

## 2022-12-11 MED ORDER — METHOCARBAMOL 750 MG PO TABS
750.0000 mg | ORAL_TABLET | Freq: Four times a day (QID) | ORAL | 0 refills | Status: DC | PRN
  Filled 2022-12-11: qty 60, 15d supply, fill #0

## 2022-12-11 MED ORDER — OXYCODONE HCL 5 MG PO TABS
2.5000 mg | ORAL_TABLET | ORAL | 0 refills | Status: DC | PRN
  Filled 2022-12-11: qty 30, 5d supply, fill #0

## 2022-12-11 MED ORDER — FOLIC ACID 1 MG PO TABS
1.0000 mg | ORAL_TABLET | Freq: Every day | ORAL | 0 refills | Status: DC
  Filled 2022-12-11: qty 30, 30d supply, fill #0

## 2022-12-11 NOTE — Telephone Encounter (Signed)
Pt called in stating Susan [name] was at the hospital with her yesterday and wanted her to come in Monday to look at her leg pt is avail anytime in the afternoon, please advise around 3 works for them if possible

## 2022-12-11 NOTE — Progress Notes (Signed)
Occupational Therapy Note  Patient Details  Name: Susan [name] MRN: [id number] Date of Birth: December 21, 1950  DME Justification: The following the equipment is recommended by occupational therapy to increase patient's ability to perform ADLs, decrease burden of care, prioritize safety, and promote overall quality of life:   Drop arm Bariatric Bedside commode   This piece of equipment is warranted as the patient has had a significant change in status, now a R LE BKA with L LE wounds.  Patient is unable to access a standard toilet, nor a standard DABSC due to need for the above features due to her physical status, body habitus, and need for increased surface area to place a slide board and perform lateral leans for clothing management.  Vicenta Dunning 12/11/2022, 12:04 PM

## 2022-12-11 NOTE — Progress Notes (Signed)
Contacted [facility] to advise clinic that pt should d/c tomorrow as planned and pt should resume care on Saturday.   Olivia Canter Renal Navigator [phone]

## 2022-12-11 NOTE — Progress Notes (Signed)
PROGRESS NOTE   Subjective/Complaints:  No acute events overnight noted.  Has been is here for family training.  They plan to hire someone to help with transportation home due to them having very tall SUVs that make vehicle transverse difficult for her.  Patient reports continued muscle cramps although Robaxin is helping.   ROS: Patient denies fever, rash, sore throat, blurred vision, dizziness, vomiting, diarrhea, cough, Abdominal pain, shortness of breath or chest pain,  headache + constipation-improved, + msucle cramps  Objective:   No results found. No results for input(s): "WBC", "HGB", "HCT", "PLT" in the last 72 hours.    No results for input(s): "NA", "K", "CL", "CO2", "GLUCOSE", "BUN", "CREATININE", "CALCIUM" in the last 72 hours.     Intake/Output Summary (Last 24 hours) at 12/11/2022 1047 Last data filed at 12/11/2022 0742 Gross per 24 hour  Intake 660 ml  Output --  Net 660 ml     Pressure Injury 08/27/22 Coccyx Mid Stage 2 -  Partial thickness loss of dermis presenting as a shallow open injury with a red, pink wound bed without slough. 58mmx5mmx0 red center unattached edges (Active)  08/27/22 1000  Location: Coccyx  Location Orientation: Mid  Staging: Stage 2 -  Partial thickness loss of dermis presenting as a shallow open injury with a red, pink wound bed without slough.  Wound Description (Comments): 63mmx5mmx0 red center unattached edges  Present on Admission:     Physical Exam: Vital Signs Blood pressure (!) 150/72, pulse 76, temperature 98.1 F (36.7 C), temperature source Oral, resp. rate 16, height 5\' 5"  (1.651 m), weight 86.6 kg, SpO2 99%.  Constitutional: No distress . Vital signs reviewed. Lying in bed, therapy in room HEENT: NCAT, EOMI, oral membranes moist Neck: supple Cardiovascular: RRR without murmur. No JVD    Respiratory/Chest: CTA Bilaterally without wheezes or rales. Normal effort     GI/Abdomen: BS +, non-tender, non-distended, soft Ext: no clubbing, cyanosis, or edema Psych: remains anxious but cooperative  Skin:  + Right BKA wound intact.  Wearing limb protector  Left calf wound  covered in dry dressings and Ace wrapped.  Dry dressing removed today and noted Aquacel in place over wound, drainage appears to be decreased from prior.    Neurologic exam: Alert and awake, decreased sensation below distal thighs, cranial nerves II through XII grossly intact Right IJ TDC in place MSK: No abnormal tone noted      + right shoulder rom still somewhat limited Pt reports soreness generalized with palpation and ROM LLE     Assessment/Plan: 1. Functional deficits which require 3+ hours per day of interdisciplinary therapy in a comprehensive inpatient rehab setting. Physiatrist is providing close team supervision and 24 hour management of active medical problems listed below. Physiatrist and rehab team continue to assess barriers to discharge/monitor patient progress toward functional and medical goals  Care Tool:  Bathing    Body parts bathed by patient: Right arm, Left arm, Chest, Face   Body parts bathed by helper: Buttocks, Front perineal area, Abdomen, Right upper leg, Left upper leg Body parts n/a: Right lower leg, Left lower leg   Bathing assist Assist Level: Dependent - Patient 0%  Upper Body Dressing/Undressing Upper body dressing   What is the patient wearing?: Hospital gown only    Upper body assist Assist Level: Maximal Assistance - Patient 25 - 49%    Lower Body Dressing/Undressing Lower body dressing      What is the patient wearing?: Incontinence brief, Hospital gown only, Ace wrap/stump shrinker     Lower body assist Assist for lower body dressing: Dependent - Patient 0%     Toileting Toileting    Toileting assist Assist for toileting: Total Assistance - Patient < 25%     Transfers Chair/bed transfer  Transfers assist  Chair/bed  transfer activity did not occur: Safety/medical concerns  Chair/bed transfer assist level: Supervision/Verbal cueing (slideboard)     Locomotion Ambulation   Ambulation assist   Ambulation activity did not occur: Safety/medical concerns (weakness, decreased balance, fear, pain)          Walk 10 feet activity   Assist  Walk 10 feet activity did not occur: Safety/medical concerns (weakness, decreased balance, fear, pain)        Walk 50 feet activity   Assist Walk 50 feet with 2 turns activity did not occur: Safety/medical concerns (weakness, decreased balance, fear, pain)         Walk 150 feet activity   Assist Walk 150 feet activity did not occur: Safety/medical concerns (weakness, decreased balance, fear, pain)         Walk 10 feet on uneven surface  activity   Assist Walk 10 feet on uneven surfaces activity did not occur: Safety/medical concerns (weakness, decreased balance, fear, pain)         Wheelchair     Assist Is the patient using a wheelchair?: Yes Type of Wheelchair: Manual    Wheelchair assist level: Minimal Assistance - Patient > 75% Max wheelchair distance: 100    Wheelchair 50 feet with 2 turns activity    Assist        Assist Level: Minimal Assistance - Patient > 75%   Wheelchair 150 feet activity     Assist      Assist Level: Moderate Assistance - Patient 50 - 74%   Blood pressure (!) 150/72, pulse 76, temperature 98.1 F (36.7 C), temperature source Oral, resp. rate 16, height 5\' 5"  (1.651 m), weight 86.6 kg, SpO2 99%.  Medical Problem List and Plan: 1. 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 -Family training today,  plan for discharge tomorrow  2.  Antithrombotics: -DVT/anticoagulation:  Pharmaceutical: Eliquis 5mg  BID             -antiplatelet therapy: N/A 3. Pain Management:  Tylenol, Robaxin, and Oxycodone prn.  -Patient had altered mental status and twitching suspected due to Lyrica toxicity prior to admission to CIR.  Lyrica was discontinued. -7/2 patient has been avoiding trying oxycodone due to constipation, discussed that we can give additional laxatives with this if needed.  We could try low-dose of Lyrica for phantom pain.  She is currently on Effexor. -7/3 will adjust oxycodone to 2.5 to 5 mg PRN -7/4 doing better with 2.5 oxycodone and robaxin  PRN, she reports minimal benefit to phantom pain with gabapentin/lyrica in the past -7/5 continue oxycodone 2.5 mg as needed and Robaxin as needed 7/8  Robaxin --increased to 750mg  q6 prn---continue -7/17 consider increase robaxin dose if muscle cramps continue to be issue, caution with new muscle relaxers due to ESRD -7/18 increase Robaxin dose to 1000 mg every 6 hours as needed, suspect soreness may be coming from increased activity with therapy.  Discussed she also could try oxycodone if pain becomes too severe 4. Mood/Behavior/Sleep/Anxiety disorder: LCSW to follow for evaluation and support.  --chronic insomnia--has Ambien prn w/ Ramelteron 8mg  QHS, and Lorazepam. -continue melatonin 7/14 anxiety still an issue. Is sleeping better, however. Hesitate to increase klonopin further given fatigue             -antipsychotic agents: N/A 5. Neuropsych/cognition: This patient is capable of making decisions on her own behalf.   6. Skin/Wound Care: Monitor wound for healing.  --Wound VAC d/c due to concerns of wound getting macerated per Dr. Lajoyce Corners.  --Dry dressing changes and to add silvadene to wound bed if wound starts getting dry.  -6/28 VAC was removed last night Dr. Lajoyce Corners advises to "Apply small amount of silvadine pulse gauze and ace wrap. Change daily  " -11/22/22 yellowish slough/drainage from wound bed, contacted Dr. Lajoyce Corners regarding this, will see if he wants BID dressing changes-- still pending response. Will f/up tomorrow. For now, doesn't look like worsening infection, can monitor closely. Will undress wound and re-photograph tomorrow -11/23/22 wound looks about the same or maybe a little better today, Dr. Lajoyce Corners apparently out of town per patient, will change to BID silvadene and dressing changes for now, to have closer eyes on wound, but may want to touch base with Dr. Lajoyce Corners this week to check if he would like to adjust anything.  -7/3 PA with ortho evaluated her wound today, recommended just dry dressings for now, appreciate assistance -7/13 continue left leg wound dressing with aquacel with 4x4 on 7/11.  WBAT LLE 7/14 aquacel dressing needs to be changed daily! Twice daily if needed 7/18 patient reports Dr. Lajoyce Corners came to see wound yesterday, she reports plan to continue Aquacel and daily dressings, drainage appears to be improved 7. Fluids/Electrolytes/Nutrition:  Strict I/O with 1200 cc FR. Monitor intake. Continue supplements/vitamins.  8. Kleb/Proteus/Enterococcus wound infection: Cefazolin for 2 weeks completed 9.  T2DM: Hgb A1c-7.9 08/27/22 -Monitor BS ac/hs and use SSI for elevated BS. Titrate basal insulin as indicated.  -6/28 she is on Semglee 40 units in the morning and 20 units at bedtime.  Increase bedtime Semglee to 22 units --7/4 decrease Semglee to 38 units in AM, 16 in afternoon due to hypoglycemia event -7/5 Cbgs controlled, continue to monitor  -11/29/22 another hypoglycemic event this morning, mostly <200 during day, will further decrease Semglee to 36U qAM + 14U qPM to see if we have fewer morning lows-- monitor -11/30/22 CBG high this morning but apparently didn't get her AM dose of insulin yesterday-- so today was the first day of the reduced dose. Will have to monitor trend. Of note, pharmacy states her dose PTA was 45U daily, not  BID dosing 7/12 semglee is 38u and 18u---persistent elevation  -adjust to 40 and 20 u today 7/14--continue current insulin given her reports of nausea, cover with SSI 7/15 will consult DM coordinator, Glucose readings have been labile 7/16 DM coordinator advised adding NovoLog meal coverage 3 units 3 times daily with meals and reducing p.m. Semglee to 12  units every afternoon, orders placed made will monitor for response 7/17 CBGs doing much better today, appreciate DM coordinator assistance 7/18 CBGs well-controlled continue current regimen CBG (last 3)  Recent Labs    12/10/22 0620 12/10/22 1143 12/10/22 1630  GLUCAP 125* 120* 126*    10. CAD/biventricular CHF: On GDMT limited due to hypotension.   --Continue Lipitor 80mg  QD   -7/17 wts stable, monitor Filed Weights   12/06/22 1518 12/06/22 1919 12/09/22 1507  Weight: 86.8 kg 83.8 kg 86.6 kg    11. ESRD: HD TTS at the end of the day to help with tolerance of therapy.  Thrombosed AVF not salvageable.                --has been on regular diet-->? Low salt restrictions.   -6/28 nephrology following appreciate assistance, next HD tomorrow  -7/1 HD plans HD today due to orthopnea + SOB -7/2 patient has scheduled HD session again today, hopefully will have benefit to orthopnea although this may be anxiety related also  -7/12 volume mgt per nephrology  12. Depression with anxiety d/o, panic attacks: Now on Venlafaxine XR 75mg  QD with Lorazepam at night.--lorazepam not ordered, will reorder 0.5mg  QHS PRN, changed to scheduled starting 11/23/22 as mentioned above.   13. PAF: On Eliquis and amiodarone 200mg  QD for rate control.   -7/18 heart rate controlled in the 70s and 80s, continue current regimen     12/11/2022    5:36 AM 12/10/2022    7:33 PM 12/10/2022   12:50 PM  Vitals with BMI  Systolic 150 150 244  Diastolic 72 64 65  Pulse 76 78 84    14. Anemia of chronic disease:  -11/22/22 Hgb stable at 8.2 today, monitor labs  -7/2  nephrology ordering Aranesp 15.   H/o GIB due to stercoral ulcer: Avoid constipation. Continue Protonix 40mg  BID, miralax QD , Senokot 2 tabs BID, and PRNs.   -11/23/22 last BM yesterday, monitor  -7/1 Small BM yesterday, increase miralax to BID for constipation   -7/2 last BM yesterday although small, continue sorbitol as needed  -7/3 sorbitol 45ml ordered  -7/5 LBM today, brown, changed miralax from BID to daily  -12/04/22   LBM  --needs bm today  -will order sorbitol 45ml, check Xray abdomen, PRN enema   -7/16 x-ray yesterday with moderate stool, she has had a few small bowel movements, will order enema for today after dialysis, increase miralax to BID'  -7/17 pt declines enema yesterday but plans to do today for constipation  -7/18 patient had 2 bowel movements yesterday, continue MiraLAX twice a day as she probably still has additional stool in her colon 16. Morbid Obesity. Dietary education  -Body mass index is 31.77 kg/m. 17.  Hypotension  -Taking midodrine 10mg  prior to dialysis  -6/29-30/24 BPs stable, monitor -7/2 consider more regular treatment of midodrine if orthostatic symptoms become limiting  -7/4-6 stable today, continue to monitor  -7/12--continue midodrine, volume mgt per renal  7/18 BP is a little higher last 24 hours with SBP's in 150s continue to monitor Vitals:   12/09/22 1700 12/09/22 1730 12/09/22 1800 12/09/22 1830  BP: 128/61 (!) 135/56 134/62 (!) 114/53   12/09/22 1900 12/09/22 1921 12/09/22 1931 12/09/22 2001  BP: (!) 123/36 (!) 93/50 (!) 123/45 135/64   12/10/22 0430 12/10/22 1250 12/10/22 1933 12/11/22 0536  BP: (!) 153/71 (!) 152/65 (!) 150/64 (!) 150/72    18. Hypothyroidism: continue synthroid qAM 19. Oxygen dependence/chronic respiratory failure:  pt states she uses PRN O2 at home, mostly related to anxiety; ordered O2 2L via [employer] to maintain SpO2 >90%. Pt with clear lung sounds, doubt need for emergent work up today. Monitor  -Okay to trial  increasing time off nasal cannula O2, provide reassurance  -off O2 as tolerated, anxiety component  20. Nausea: unclear etiology -recurrent  -has reglan and compazine prn  -abdomen benign appearing  -amiodarone effect but this is chronic med  -suspect anxiety component  -7/14 will change reglan to 5mg  tid scheduled   -hold rena-vit for now   -request input from nephrology regarding potential renal source   -ego support/manage anxiety  -check abdominal xray- increase stool   -Improved   LOS: 21 days A FACE TO FACE EVALUATION WAS PERFORMED  Fanny Dance 12/11/2022, 10:47 AM

## 2022-12-11 NOTE — Progress Notes (Signed)
Inpatient Rehabilitation Discharge Medication Review by a Pharmacist  A complete drug regimen review was completed for this patient to identify any potential clinically significant medication issues.  High Risk Drug Classes Is patient taking? Indication by Medication  Antipsychotic No   Anticoagulant Yes Apixaban - Afib  Antibiotic No   Opioid Yes Oxycodone- prn pain  Antiplatelet No   Hypoglycemics/insulin Yes Insulin - DM  Vasoactive Medication Yes Amiodarone - Afib Midodrine- low BP  Chemotherapy No   Other Yes Albuterol prn SOB  Atorvastatin - HLD Folic acid - supplement Levothyroxine - low thyroid  Methocarbamol - prn spasms  Ramelteon, Effexor, clonazepam  - mood  Sevelamer - phos binder, ESRD  Metoclopramide, pantoprazole - reflux      Type of Medication Issue Identified Description of Issue Recommendation(s)  Drug Interaction(s) (clinically significant)     Duplicate Therapy     Allergy     No Medication Administration End Date     Incorrect Dose     Additional Drug Therapy Needed     Significant med changes from prior encounter (inform family/care partners about these prior to discharge).    Other       Clinically significant medication issues were identified that warrant physician communication and completion of prescribed/recommended actions by midnight of the next day:  No  Time spent performing this drug regimen review (minutes):  20 minutes  Thank you Okey Regal, PharmD

## 2022-12-11 NOTE — Progress Notes (Signed)
Physical Therapy Session Note  Patient Details  Name: Susan [name] MRN: [id number] Date of Birth: 1950-10-22  Today's Date: 12/11/2022 PT Individual Time: 6644-0347 and 1000-1113 PT Individual Time Calculation (min): 26 min and 73 min  Short Term Goals: Week 2:  PT Short Term Goal 1 (Week 2): Pt will initiate sit<>stand with R LE prosthetic with LRAD and +2 max A PT Short Term Goal 1 - Progress (Week 2): Met PT Short Term Goal 2 (Week 2): Pt will initiate squat pivot transfer for pt transfer into [employer]ota 4 runner PT Short Term Goal 2 - Progress (Week 2): Met Week 3:  PT Short Term Goal 1 (Week 3): STG=LTG 2/2 ELOS  Skilled Therapeutic Interventions/Progress Updates:   Treatment Session 1 Received pt semi-reclined in bed with husband, Gabriel Rung, present at bedside. Pt reported pain 8/10 in LLE (declined pain medications) and requested to remain in bed and talk through education since session only 30 minutes and this therapist returning for a longer session later.   Discussed the following: - pt's extreme difficulty with standing and pivoting, requiring max/mod A +2 due to pain and weakness - recommended primary transfer method to be using the slideboard - inability to use slideboard for car transfer into 30in high car seat. After extensive discussion, pt and husband in agreement to use private transportation service to get home and potentially for a few upcoming visits while looking to purchase a WC accessible van or trade in their current car for one with a lower seat height. - current prosthetic is a demo and it will take time and multiple visits with prosthetist before pt will have a leg that is custom fit for her - after being fit for custom prosthetic, pt will then have to learn how to stand/walk with it Concluded session with pt semi-reclined in bed, needs within reach, and Joe present at bedside.   Treatment Session 2 Received pt in hallway with husband - hand off from OT family education  session. Pt agreeable to PT treatment and reported pain 8/10 in LLE. Session with emphasis on discharge planning, functional mobility/transfers, and prosthetic care and management.  Returned to room and had pt practice directing Joe in donning/doffing prosthetic (see further details below). Pt requires most assist when donning gel liner, aligning pin into hole, and when removing leg. Pt's equipment delievered but drop arm commode not bariatric - notified CSW. Pt and Joe then instructed in blocked practice slideboard transfers to/from bed. Pt has 24in high hospital bed that she plans to use initially with goal of eventually getting back into her regular bed. Therapist assisted with 1st transfer from WC<>bed via slideboard with mod A fading to min A due to pt's initial fear of leaning forward. Pt then performed x 3 additional slideboard transfers with Joe providing mod A - however noted increased difficulty for pt to anteriorly weight shift due to Joe's height. Demonstrated "over the back" technique for slideboard transfers for comfort/safety of Joe's back and to allow pt to get more forward - Joe reported that over the back technique was more comfortable for him. Therefore performed additional transfer using slideboard with mod A using "over the back" technique - total of 5 transfers. Pt requested to return to bed due to fatigue and transferred sit<>supine with assist provided by Joe for BLE management. Pt able to scoot to [facility] pulling on headboard with supervision.  Educated pt's husband on the following: - steps to don demo prosthetic (black sock<>gel liner<>2 socks (1  blue and 1 green)<>aligning pin into hole. Pt required min A from Joe to position gel liner and total A to get prosthetic pin to align in hole. - how to doff prosthetic  - placing and removing slideboard during transfers - head/hips relationship during transfer - hand placement and body mechanics/positioning during transfers - adjusting pt's  legs (mainly prosthetic) mid-transfer - allowing pt to do as much as possible to limit pain and encourage independence  Concluded session with pt semi-reclined in bed, needs within reach, and bed alarm on. Joe present at bedside.   Therapy Documentation Precautions:  Precautions Precautions: Fall Precaution Comments: L lateral lower leg wound, R rotator cuff tear. Demo prosthetic for RLE Restrictions Weight Bearing Restrictions: Yes LLE Weight Bearing: Weight bearing as tolerated  Therapy/Group: Individual Therapy Marlana Salvage Zaunegger Blima Rich PT, DPT 12/11/2022, 6:53 AM

## 2022-12-11 NOTE — Plan of Care (Signed)
  Problem: RH Balance Goal: LTG Patient will maintain dynamic standing with ADLs (OT) Description: LTG:  Patient will maintain dynamic standing balance with assist during activities of daily living (OT)  Outcome: Adequate for Discharge   Problem: Sit to Stand Goal: LTG:  Patient will perform sit to stand in prep for activites of daily living with assistance level (OT) Description: LTG:  Patient will perform sit to stand in prep for activites of daily living with assistance level (OT) Outcome: Adequate for Discharge   Problem: RH Balance Goal: LTG Patient will maintain dynamic standing with ADLs (OT) Description: LTG:  Patient will maintain dynamic standing balance with assist during activities of daily living (OT)  Outcome: Adequate for Discharge   Problem: Sit to Stand Goal: LTG:  Patient will perform sit to stand in prep for activites of daily living with assistance level (OT) Description: LTG:  Patient will perform sit to stand in prep for activites of daily living with assistance level (OT) Outcome: Adequate for Discharge   Problem: RH Balance Goal: LTG: Patient will maintain dynamic sitting balance (OT) Description: LTG:  Patient will maintain dynamic sitting balance with assistance during activities of daily living (OT) Outcome: Completed/Met   Problem: RH Grooming Goal: LTG Patient will perform grooming w/assist,cues/equip (OT) Description: LTG: Patient will perform grooming with assist, with/without cues using equipment (OT) Outcome: Completed/Met   Problem: RH Bathing Goal: LTG Patient will bathe all body parts with assist levels (OT) Description: LTG: Patient will bathe all body parts with assist levels (OT) Outcome: Completed/Met   Problem: RH Dressing Goal: LTG Patient will perform upper body dressing (OT) Description: LTG Patient will perform upper body dressing with assist, with/without cues (OT). Outcome: Completed/Met Goal: LTG Patient will perform lower body  dressing w/assist (OT) Description: LTG: Patient will perform lower body dressing with assist, with/without cues in positioning using equipment (OT) Outcome: Completed/Met   Problem: RH Toileting Goal: LTG Patient will perform toileting task (3/3 steps) with assistance level (OT) Description: LTG: Patient will perform toileting task (3/3 steps) with assistance level (OT)  Outcome: Completed/Met   Problem: RH Toilet Transfers Goal: LTG Patient will perform toilet transfers w/assist (OT) Description: LTG: Patient will perform toilet transfers with assist, with/without cues using equipment (OT) Outcome: Completed/Met   Problem: RH Tub/Shower Transfers Goal: LTG Patient will perform tub/shower transfers w/assist (OT) Description: LTG: Patient will perform tub/shower transfers with assist, with/without cues using equipment (OT) Outcome: Completed/Met

## 2022-12-11 NOTE — Progress Notes (Signed)
Physical Therapy Session Note   Patient Details  Name: Susan [name] MRN: [id number] Date of Birth: 1951/05/02   Today's Date: 12/11/2022 PT Individual Time: 1135-1207 PT Individual Time Calculation (min): 32 min   Short Term Goals: Week 3:  PT Short Term Goal 1 (Week 3): STG=LTG 2/2 ELOS   Skilled Therapeutic Interventions/Progress Updates:  Pt presents in room in bed, reporting fatigue but agreeable to PT with encouragement. Pt does not report pain at this time. Session focused on continued family training for transfers and bed mobility with pt husband as well as supine therex and HEP prescription.  Pt completes bed mobility with mod assist from husband using hospital bed features. Pt husband places slideboard with total assist, and reaching over back of pt to grab onto pants for initiating scoot, therapist with hands on CGA for safety. With initial scoot pt slides forward on slideboard with pt demonstrating reflexive leaning back, therapist provides mod assist for scooting hips posteriorly on board. Therapist prompts pt husband for blocking pt L knee to prevent hips sliding forward during transfer with pt husband demonstrating understanding and with verbal cues for leaning forward pt completes transfer to [facility] with mod assist x2. Pt and pt husband provided with extensive education with knowledge of results cues to assist with pt husband body mechanics, BLE leg placement, blocking pt knees for transfer. Pt educated on leaning forward during transfer to prevent hips sliding forward on board with pt verbalizing understanding. Pt and pt husband complete transfer back to bed with significant improvement in technique requiring mod assist only. Pt completes sit to supine with max assist from husband for managing BLEs. Pt scooted towards [facility] with assist x2.  Pt then provided with HEP for BLE strengthening to be completed at DC in supine and sitting with education on frequency and desired technique  including: Supine hip flexion x10 BLE Supine glute set x20 Supine SAQ x10 BLE (towel roll under knee)  Pt remains supine with all needs within reach, call light in place, and pt husband at bedside at end of session.   Therapy Documentation Precautions:  Precautions Precautions: Fall Precaution Comments: L lateral lower leg wound, R rotator cuff tear. Demo prosthetic for RLE Restrictions Weight Bearing Restrictions: Yes LLE Weight Bearing: Weight bearing as tolerated     Therapy/Group: Individual Therapy Edwin Cap PT, DPT

## 2022-12-11 NOTE — Telephone Encounter (Signed)
I sch pt at 3 pm on Monday can you please call to advise.

## 2022-12-11 NOTE — Progress Notes (Signed)
Occupational Therapy Discharge Summary  Patient Details  Name: Susan [name] MRN: [id number] Date of Birth: 08/19/1950  Date of Discharge from OT service:December 11, 2022  Today's Date: 12/11/2022 OT Individual Time: 0900-1000 OT Individual Time Calculation (min): 60 min    Patient has met 9 of 9 long term goals due to improved activity tolerance, improved balance, postural control, ability to compensate for deficits, functional use of  RIGHT upper, RIGHT lower, LEFT upper, and LEFT lower extremity, and improved coordination.  Patient to discharge at overall Mod Assist level.  Patient's care partner is independent to provide the necessary physical and cognitive assistance at discharge.    Reasons goals not met: n/a   Recommendation:  Patient will benefit from ongoing skilled OT services in home health setting to continue to advance functional skills in the area of BADL, iADL, and Reduce care partner burden.  Equipment: DABSC- HD, LH sponge, sock aide   Reasons for discharge: treatment goals met  Patient/family agrees with progress made and goals achieved: Yes  OT Discharge Precautions/Restrictions  Precautions Precautions: Fall Precaution Comments: L lateral lower leg wound, R rotator cuff tear. Demo prosthetic for RLE Restrictions Weight Bearing Restrictions: Yes LLE Weight Bearing: Weight bearing as tolerated  Vital Signs Therapy Vitals Temp: 97.8 F (36.6 C) Pulse Rate: 73 Resp: 17 BP: (!) 151/62 Patient Position (if appropriate): Lying Oxygen Therapy SpO2: 99 % O2 Device: Nasal Cannula O2 Flow Rate (L/min): 2 L/min Patient Activity (if Appropriate): In bed Pulse Oximetry Type: Continuous Oximetry Probe Site Changed: No Pain Pain Assessment Pain Scale: 0-10 Pain Score: 0-No pain Pain Type: Acute pain Pain Location: Leg Pain Orientation: Left Pain Descriptors / Indicators: Aching Pain Onset: On-going Patients Stated Pain Goal: 1 Pain Intervention(s): Pain  med given for lower pain score than stated, per patient request;Elevated extremity;Relaxation;Rest;Emotional support Multiple Pain Sites: No ADL ADL Equipment Provided: Long-handled sponge, Sock aid Eating: Independent Where Assessed-Eating: Wheelchair Grooming: Modified independent Where Assessed-Grooming: Sitting at sink Upper Body Bathing: Modified independent Where Assessed-Upper Body Bathing: Sitting at sink, Wheelchair Lower Body Bathing: Moderate assistance Where Assessed-Lower Body Bathing: Sitting at sink, Bed level Upper Body Dressing: Modified independent (Device) Where Assessed-Upper Body Dressing: Wheelchair Lower Body Dressing: Maximal assistance Where Assessed-Lower Body Dressing: Bed level Toileting: Moderate assistance Where Assessed-Toileting: Bedside Commode, Bed level Toilet Transfer: Moderate assistance Toilet Transfer Method: Transfer board, Squat pivot Toilet Transfer Equipment: Extra wide drop arm bedside commode Tub/Shower Transfer: Moderate assistance Tub/Shower Transfer Method: Psychologist, prison and probation services: Insurance underwriter: Moderate assistance Film/video editor Method: Fish farm manager, Hotel manager ADL Comments: husband trained on all aspects of bed, commode, w/c, sink side and TTB training for ADL's with AD and DME Vision Baseline Vision/History: 1 Wears glasses Patient Visual Report: No change from baseline Vision Assessment?: Yes Eye Alignment: Within Functional Limits Perception  Perception: Within Functional Limits Praxis Praxis: Intact Cognition Cognition Overall Cognitive Status: Within Functional Limits for tasks assessed Arousal/Alertness: Awake/alert Memory: Impaired Awareness: Appears intact Problem Solving: Impaired Safety/Judgment: Appears intact Brief Interview for Mental Status (BIMS) Repetition of Three Words (First Attempt): 3 Temporal  Orientation: Year: Correct Temporal Orientation: Month: Accurate within 5 days Temporal Orientation: Day: Correct Recall: "Sock": Yes, no cue required Recall: "Blue": Yes, no cue required Recall: "Bed": Yes, no cue required BIMS Summary Score: 15 Sensation Sensation Light Touch: Impaired by gross assessment Hot/Cold: Impaired by gross assessment Proprioception: Impaired by gross assessment Stereognosis: Impaired by gross  assessment Coordination Gross Motor Movements are Fluid and Coordinated: No Fine Motor Movements are Fluid and Coordinated: No Coordination and Movement Description: Improved but continuing pain following prolonged bed rest and debility, Bil hand/foot neuropathy, large wound on LLE affecting overall pain Finger Nose Finger Test: grossly intact 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/ weakness Motor  Motor Motor - Skilled Clinical Observations: significant deficits due to effects of prolonged debility and pain Motor - Discharge Observations: continued deficits 2/2 prolonged debility and pain, difficulty with new prosthetic Mobility  Bed Mobility Bed Mobility: Rolling Right;Rolling Left;Sit to Supine;Supine to Sit Rolling Right: Independent with assistive device Rolling Left: Independent with assistive device Supine to Sit: Minimal Assistance - Patient > 75% Sit to Supine: Supervision/Verbal cueing  Trunk/Postural Assessment  Cervical Assessment Cervical Assessment: Within Functional Limits Thoracic Assessment Thoracic Assessment: Exceptions to [facility] Lumbar Assessment Lumbar Assessment: Exceptions to [facility]  Balance Balance Balance Assessed: Yes Static Sitting Balance Static Sitting - Balance Support: Feet supported;Bilateral upper extremity supported Static Sitting - Level of Assistance: 6: Modified independent (Device/Increase time) Dynamic Sitting Balance Dynamic Sitting - Balance Support: Feet supported;During functional activity;No upper  extremity supported Dynamic Sitting - Level of Assistance: 5: Stand by assistance Extremity/Trunk Assessment RUE Assessment RUE Assessment: Exceptions to [facility] General Strength Comments: grossly 3/5 LUE Assessment LUE Assessment: Exceptions to [facility] General Strength Comments: grossly 3/5  OT Treatment and Interventions:   Pt seen for final skilled OT sesison this am. Focus of session on Family Education with pt's husband and pt. Comprehensive bed level, EOB, DABSC, w/c, TTB level training conducted. Husband Joe able to demonstrate competency with all aspects of ADL's and basic mobility including TB use. Pt able to use LH sponge, reacher, sock aide and TB during ADL and transfer routine. Utilized and ddemo lateral transfers this sesison. OT trained in tub room to mirror home set up and promoted UE HEP with w/c skills as well as light UE tputty, foam grasp cubes and seated mini push ups. Husband verbalized understanding with + teach back. No further CIR needs at this time with OT d/c completed and rec for [facility] services. Hand off to PT for final session.    Vicenta Dunning 12/11/2022, 4:45 PM

## 2022-12-11 NOTE — Progress Notes (Signed)
[facility] Progress Note   Subjective:   Patient seen in hallway, about to start therapy. No complaints.  Objective Vitals:   12/10/22 0430 12/10/22 1250 12/10/22 1933 12/11/22 0536  BP: (!) 153/71 (!) 152/65 (!) 150/64 (!) 150/72  Pulse: 78 84 78 76  Resp: 16 19 16 16   Temp: 97.7 F (36.5 C) 98.8 F (37.1 C) 98.2 F (36.8 C) 98.1 F (36.7 C)  TempSrc: Oral Oral Oral Oral  SpO2: 100% 94% 100% 99%  Weight:      Height:       Physical Exam General:chronically ill appearing female in NAD, in wheelchair doing PT Heart:RRR, no mrg Lungs:CTAB, nml WOB on RA Abdomen:soft, NTND Extremities:trace edema LLE, R BKA Dialysis Access: [facility]   Filed Weights   12/06/22 1518 12/06/22 1919 12/09/22 1507  Weight: 86.8 kg 83.8 kg 86.6 kg    Intake/Output Summary (Last 24 hours) at 12/11/2022 1027 Last data filed at 12/11/2022 0742 Gross per 24 hour  Intake 660 ml  Output --  Net 660 ml    Additional Objective Labs: Basic Metabolic Panel: Recent Labs  Lab 12/06/22 1539  NA 132*  K 3.7  CL 95*  CO2 26  GLUCOSE 273*  BUN 70*  CREATININE 4.85*  CALCIUM 9.1  PHOS 4.1   Liver Function Tests: Recent Labs  Lab 12/06/22 1539  ALBUMIN 2.9*   Recent Labs  Lab 12/06/22 1538  WBC 6.8  HGB 10.4*  HCT 34.7*  MCV 79.2*  PLT 275   CBG: Recent Labs  Lab 12/09/22 2054 12/09/22 2211 12/10/22 0620 12/10/22 1143 12/10/22 1630  GLUCAP 127* 195* 125* 120* 126*    Studies/Results: No results found.  Medications:   (feeding supplement) PROSource Plus  30 mL Oral BID BM   amiodarone  200 mg Oral Daily   apixaban  5 mg Oral BID   ascorbic acid  500 mg Oral Daily   atorvastatin  80 mg Oral Daily   Chlorhexidine Gluconate Cloth  6 each Topical BID   clonazepam  0.5 mg Oral BID   darbepoetin (ARANESP) injection - DIALYSIS  150 mcg Subcutaneous Q Wed-1800   feeding supplement (NEPRO CARB STEADY)  237 mL Oral BID BM   folic acid  1 mg Oral Daily    hydrocerin   Topical BID   insulin aspart  0-6 Units Subcutaneous TID WC   insulin aspart  3 Units Subcutaneous TID WC   insulin glargine-yfgn  12 Units Subcutaneous QHS   insulin glargine-yfgn  40 Units Subcutaneous Daily   levothyroxine  75 mcg Oral QAC breakfast   melatonin  5 mg Oral QHS   metoCLOPramide  5 mg Oral TID AC   midodrine  10 mg Oral Q T,Th,Sa-HD   pantoprazole  40 mg Oral BID AC   polyethylene glycol  17 g Oral BID   ramelteon  8 mg Oral QHS   senna-docusate  2 tablet Oral QHS   sevelamer carbonate  1,600 mg Oral TID WC   venlafaxine XR  75 mg Oral Q breakfast    Dialysis Orders: [facility] TTS  4hr, 400/800, EDW 91.5kg, 2K/2Ca, TDC, heparin 4000U bolus  - Mircera 75 mcg IV q 2 weeks  - last 6/8 - Calcitriol 0.5 three times per week  Assessment/Plan: LLE wound infection: B Cx neg. S/p OR debridement/wound vac on 6/21. Wound Cx grew Proteus + Klebsiella + Enterococcus:  IV antibiotics dose has been completed . ESRD: Usual TTS schedule. "Extra" HD 7/1  due to dyspnea/orthopnea, then back to usual schedule.   Next HD today, labs requested Thrombosed AVF - not salvageable, TDC in use. For new access in near future, already has appt. Hypotension/volume: BP stable- gets midodrine pre-HD. 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  Metabolic bone disease: CorrCa up slightly, phos in goal. continue Renvela as binder. VDRA on hold.   Nutrition: Alb low, continue supps. AFib - on amiodarone/Eliquis  T2DM - historically poorly controlled. Insulin per primary AMS/Twitching - felt secondary to Lyrica toxicity in ESRD. Resolved after discontinuing medication.  Dispo: In CIR. Constipation nausea-add as needed sorbitol to MiraLAX and stool softener  Anthony Sar, MD [place]

## 2022-12-11 NOTE — Progress Notes (Signed)
   12/11/22 1715  Vitals  Temp (!) 97.4 F (36.3 C)  BP (!) 126/99  Pulse Rate 73  Resp 16  Oxygen Therapy  SpO2 100 %  O2 Device Nasal Cannula  O2 Flow Rate (L/min) 3 L/min  Patient Activity (if Appropriate) In bed  Pulse Oximetry Type Continuous  Oximetry Probe Site Changed No  During Treatment Monitoring  Intra-Hemodialysis Comments Tx completed  Post Treatment  Dialyzer Clearance Lightly streaked  Duration of HD Treatment -hour(s) 4 hour(s)  Hemodialysis Intake (mL) 0 mL  Liters Processed 96  Fluid Removed (mL) 2000 mL  Tolerated HD Treatment Yes  Post-Hemodialysis Comments HD completed and tolerated very well. Denies any symptoms. No cramping, vitals WNL.

## 2022-12-11 NOTE — Procedures (Signed)
I was present at this dialysis session. I have reviewed the session itself and made appropriate changes.   Filed Weights   12/06/22 1919 12/09/22 1507 12/11/22 1305  Weight: 83.8 kg 86.6 kg 84.2 kg    Recent Labs  Lab 12/11/22 1129  NA 133*  K 4.2  CL 96*  CO2 24  GLUCOSE 83  BUN 69*  CREATININE 4.98*  CALCIUM 9.6  PHOS 3.8    Recent Labs  Lab 12/06/22 1538 12/11/22 1129  WBC 6.8 5.9  HGB 10.4* 11.9*  HCT 34.7* 39.2  MCV 79.2* 78.1*  PLT 275 255    Scheduled Meds:  (feeding supplement) PROSource Plus  30 mL Oral BID BM   amiodarone  200 mg Oral Daily   apixaban  5 mg Oral BID   ascorbic acid  500 mg Oral Daily   atorvastatin  80 mg Oral Daily   Chlorhexidine Gluconate Cloth  6 each Topical BID   clonazepam  0.5 mg Oral BID   darbepoetin (ARANESP) injection - DIALYSIS  150 mcg Subcutaneous Q Wed-1800   feeding supplement (NEPRO CARB STEADY)  237 mL Oral BID BM   folic acid  1 mg Oral Daily   hydrocerin   Topical BID   insulin aspart  0-6 Units Subcutaneous TID WC   insulin aspart  3 Units Subcutaneous TID WC   insulin glargine-yfgn  12 Units Subcutaneous QHS   insulin glargine-yfgn  40 Units Subcutaneous Daily   levothyroxine  75 mcg Oral QAC breakfast   melatonin  5 mg Oral QHS   metoCLOPramide  5 mg Oral TID AC   midodrine  10 mg Oral Q T,Th,Sa-HD   pantoprazole  40 mg Oral BID AC   polyethylene glycol  17 g Oral BID   ramelteon  8 mg Oral QHS   senna-docusate  2 tablet Oral QHS   sevelamer carbonate  1,600 mg Oral TID WC   venlafaxine XR  75 mg Oral Q breakfast   Continuous Infusions: PRN Meds:.acetaminophen **OR** acetaminophen, calcium carbonate (dosed in mg elemental calcium), heparin, ipratropium-albuterol, methocarbamol, oxyCODONE, polyethylene glycol, prochlorperazine, sorbitol   Anthony Sar, MD [facility] 12/11/2022, 1:32 PM

## 2022-12-11 NOTE — Progress Notes (Addendum)
Patient ID: Susan [name], female   DOB: [birth year], 72 y.o.   MRN: [id number]  Met with pt and husband who is here for family education it went well according to the both of them Aware trying to get bariatric drop-arm bedside commode covered with justification note from OT. Husband planning on getting a wheelchair accessible [name] due to OP-HD and MD appointments. Will use private transport to get home tomorrow. Pt getting ready to go to HD.  2;21 PM The OT note was enough to justify getting a bariatric drop-arm bedside commode and [employer] did switch them out. Currently in he room along with the transfer board.

## 2022-12-11 NOTE — Progress Notes (Signed)
Inpatient Rehabilitation Care Coordinator Discharge Note   Patient Details  Name: Susan [name] MRN: [id number] Date of Birth: [birth year]   Discharge location: HOME WITH HUSBAND WHO IS ABLE TO PROVIDE 24/7 CARE  Length of Stay: 22 DAYS  Discharge activity level: MIN ASSIST WHEELCHAIR LEVEL  Home/community participation: HOME BOUND  Patient response WG:NFAOZH Literacy - How often do you need to have someone help you when you read instructions, pamphlets, or other written material from your doctor or pharmacy?: Never  Patient response YQ:MVHQIO Isolation - How often do you feel lonely or isolated from those around you?: Never  Services provided included: MD, RD, PT, OT, RN, CM, TR, Pharmacy, Neuropsych, SW  Financial Services:  Field seismologist Utilized: [employer] HEALTH TEAM ADVANTAGE  Choices offered to/list presented to: PT AND HUSBAND  Follow-up services arranged:  Home Health, DME, Patient/Family request agency HH/DME Home Health Agency: [facility]  PT  OT  RN    DME : [facility]-30 TRASNFER BOARD AND DROP-ARM BEDSIDE COMMODE HH/DME Requested Agency: [facility] HAS ALL OTHER EQUIPMENT FROM PAST ADMISSIONS TO HOSPITAL AND SNF  Patient response to transportation need: Is the patient able to respond to transportation needs?: Yes In the past 12 months, has lack of transportation kept you from medical appointments or from getting medications?: No In the past 12 months, has lack of transportation kept you from meetings, work, or from getting things needed for daily living?: No   Patient/Family verbalized understanding of follow-up arrangements:  Yes  Individual responsible for coordination of the follow-up plan: SELF AND Susan [name] [phone]  Confirmed correct DME delivered: Lucy Chris 12/11/2022    Comments (or additional information):HUSBAND WAS IN FOR HANDS ON EDUCATION AND IT WENT WELL. SHE IS DOING BETTER THAN SHE WAS AT HOME  PRIOR TO ADMISSION. BOTH FEEL COMFORTABLE WITH DC HOME TOMORROW. HUSBAND THINKING ABOUT PURCHASING A WHEELCHAIR ACCESSIBLE VAN DUE TO PT CAN NOT GET INTO EITHER OF THEIR VEHICLES. SHE HAS MANY FOLLOW UP APPOINTMENTS WITH OP-HD AND MD APPOINTMENTS  Summary of Stay    Date/Time Discharge Planning CSW  12/10/22 [phone] Family education scheduled with husand for tomorrow aware can not get into a SUV would need car. Pt anxious regarding prothetic and using. Made progress with transfers while here. Husband aware will still require 24/7 care at discharge. DME arranged and HH in place RGD  12/03/22 0846 Hoping to get pt to be able to transfer without the hoyer and can do a car transfer to get away from private transport which is expensive. Will need husband to come in for education prior to DC home. Has HH in place and DME from past admissions RGD  11/26/22 0858 Home with husband who was on FMLA but has returned to work until pt is discharged from CIR. Has all DME from past admissons. Main goal is transfers and car transfers so will not need to use private transport for MD appointments-due to cost RGD       Lucillie Kiesel, Lemar Livings

## 2022-12-12 ENCOUNTER — Other Ambulatory Visit (HOSPITAL_COMMUNITY): Payer: Self-pay

## 2022-12-12 LAB — GLUCOSE, CAPILLARY: Glucose-Capillary: 164 mg/dL — ABNORMAL HIGH (ref 70–99)

## 2022-12-12 NOTE — Progress Notes (Signed)
Patient ID: Susan [name], female   DOB: 06/29/1950, 72 y.o.   MRN: [id number] Supplies for dressing changes at home delivered to the patient.  Pamelia Hoit

## 2022-12-12 NOTE — Progress Notes (Signed)
PA Jesusita Oka discussed D/C instructions with pt/family. Pt/family in agreement. Belongings were gathered by family and taken off unit. Pt family verbalized that he will complete pt LLE dressing change when they arrive home. Pt family verbalized that they are comfortable with dressing changes and have no further questions.Meds returned to pt and family. Pt left per wheelchair to private vehicle Mylo Red, LPN

## 2022-12-12 NOTE — Progress Notes (Signed)
PROGRESS NOTE   Subjective/Complaints:  Patient reports her muscle cramps have improved.  She is excited about going home today.  No additional concerns this morning.   ROS: Patient denies fever, rash, sore throat, blurred vision, dizziness, vomiting, diarrhea, cough, Abdominal pain, shortness of breath or chest pain,  headache + constipation-improved, + msucle cramps-Improved  Objective:   No results found. Recent Labs    12/11/22 1129  WBC 5.9  HGB 11.9*  HCT 39.2  PLT 255      Recent Labs    12/11/22 1129  NA 133*  K 4.2  CL 96*  CO2 24  GLUCOSE 83  BUN 69*  CREATININE 4.98*  CALCIUM 9.6       Intake/Output Summary (Last 24 hours) at 12/12/2022 0837 Last data filed at 12/11/2022 1858 Gross per 24 hour  Intake 480 ml  Output 2000 ml  Net -1520 ml     Pressure Injury 08/27/22 Coccyx Mid Stage 2 -  Partial thickness loss of dermis presenting as a shallow open injury with a red, pink wound bed without slough. 51mmx5mmx0 red center unattached edges (Active)  08/27/22 1000  Location: Coccyx  Location Orientation: Mid  Staging: Stage 2 -  Partial thickness loss of dermis presenting as a shallow open injury with a red, pink wound bed without slough.  Wound Description (Comments): 79mmx5mmx0 red center unattached edges  Present on Admission:     Physical Exam: Vital Signs Blood pressure (!) 140/58, pulse 74, temperature 98.5 F (36.9 C), temperature source Oral, resp. rate 17, height 5\' 5"  (1.651 m), weight 82.2 kg, SpO2 100%.  Constitutional: No distress . Vital signs reviewed. Lying in bed, therapy in room HEENT: NCAT, EOMI, oral membranes moist Neck: supple Cardiovascular: RRR without murmur. No JVD    Respiratory/Chest: CTA Bilaterally without wheezes or rales. Normal effort    GI/Abdomen: BS +, non-tender, non-distended, soft Ext: no clubbing, cyanosis, or edema Psych: remains anxious but  cooperative  Skin:  + Right BKA wound intact.   Left calf wound  covered in dry dressings and Ace wrapped.      Neurologic exam: Alert and awake, decreased sensation below distal thighs, cranial nerves II through XII grossly intact MSK: No abnormal tone noted      + right shoulder rom still somewhat limited Decreased tenderness in bilateral lower extremities today     Assessment/Plan: 1. Functional deficits which require 3+ hours per day of interdisciplinary therapy in a comprehensive inpatient rehab setting. Physiatrist is providing close team supervision and 24 hour management of active medical problems listed below. Physiatrist and rehab team continue to assess barriers to discharge/monitor patient progress toward functional and medical goals  Care Tool:  Bathing    Body parts bathed by patient: Right arm, Left arm, Chest, Face, Abdomen, Front perineal area, Right upper leg, Left upper leg, Left lower leg   Body parts bathed by helper: Buttocks Body parts n/a: Right lower leg   Bathing assist Assist Level: Moderate Assistance - Patient 50 - 74%     Upper Body Dressing/Undressing Upper body dressing   What is the patient wearing?: Pull over shirt    Upper body assist  Assist Level: Independent with assistive device    Lower Body Dressing/Undressing Lower body dressing      What is the patient wearing?: Pants, Incontinence brief, Ace wrap/stump shrinker     Lower body assist Assist for lower body dressing: Moderate Assistance - Patient 50 - 74%     Toileting Toileting    Toileting assist Assist for toileting: Moderate Assistance - Patient 50 - 74%     Transfers Chair/bed transfer  Transfers assist  Chair/bed transfer activity did not occur: Safety/medical concerns  Chair/bed transfer assist level: Supervision/Verbal cueing     Locomotion Ambulation   Ambulation assist   Ambulation activity did not occur: Safety/medical concerns (weakness, decreased  balance, fear, pain)          Walk 10 feet activity   Assist  Walk 10 feet activity did not occur: Safety/medical concerns (weakness, decreased balance, fear, pain)        Walk 50 feet activity   Assist Walk 50 feet with 2 turns activity did not occur: Safety/medical concerns (weakness, decreased balance, fear, pain)         Walk 150 feet activity   Assist Walk 150 feet activity did not occur: Safety/medical concerns (weakness, decreased balance, fear, pain)         Walk 10 feet on uneven surface  activity   Assist Walk 10 feet on uneven surfaces activity did not occur: Safety/medical concerns (weakness, decreased balance, fear, pain)         Wheelchair     Assist Is the patient using a wheelchair?: Yes Type of Wheelchair: Manual    Wheelchair assist level: Minimal Assistance - Patient > 75% Max wheelchair distance: 100    Wheelchair 50 feet with 2 turns activity    Assist        Assist Level: Minimal Assistance - Patient > 75%   Wheelchair 150 feet activity     Assist      Assist Level: Moderate Assistance - Patient 50 - 74%   Blood pressure (!) 140/58, pulse 74, temperature 98.5 F (36.9 C), temperature source Oral, resp. rate 17, height 5\' 5"  (1.651 m), weight 82.2 kg, SpO2 100%.  Medical Problem List and Plan: 1. 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 -DC home today  2.  Antithrombotics: -DVT/anticoagulation:  Pharmaceutical: Eliquis 5mg  BID             -antiplatelet therapy: N/A 3. Pain Management:  Tylenol, Robaxin, and Oxycodone prn.  -Patient had altered mental status and twitching suspected due to Lyrica toxicity prior to  admission to CIR.  Lyrica was discontinued. -7/2 patient has been avoiding trying oxycodone due to constipation, discussed that we can give additional laxatives with this if needed.  We could try low-dose of Lyrica for phantom pain.  She is currently on Effexor. -7/3 will adjust oxycodone to 2.5 to 5 mg PRN -7/4 doing better with 2.5 oxycodone and robaxin PRN, she reports minimal benefit to phantom pain with gabapentin/lyrica in the past -7/5 continue oxycodone 2.5 mg as needed and Robaxin as needed 7/8  Robaxin --increased  to 750mg  q6 prn---continue -7/17 consider increase robaxin dose if muscle cramps continue to be issue, caution with new muscle relaxers due to ESRD -7/18 increase Robaxin dose to 1000 mg every 6 hours as needed, suspect soreness may be coming from increased activity with therapy.  Discussed she also could try oxycodone if pain becomes too severe 7/19 muscle cramps improved, continue Robaxin as needed 4. Mood/Behavior/Sleep/Anxiety disorder: LCSW to follow for evaluation and support.  --chronic insomnia--has Ambien prn w/ Ramelteron 8mg  QHS, and Lorazepam. -continue melatonin 7/14 anxiety still an issue. Is sleeping better, however. Hesitate to increase klonopin further given fatigue             -antipsychotic agents: N/A 5. Neuropsych/cognition: This patient is capable of making decisions on her own behalf.   6. Skin/Wound Care: Monitor wound for healing.  --Wound VAC d/c due to concerns of wound getting macerated per Dr. Lajoyce Corners.  --Dry dressing changes and to add silvadene to wound bed if wound starts getting dry.  -6/28 VAC was removed last night Dr. Lajoyce Corners advises to "Apply small amount of silvadine pulse gauze and ace wrap. Change daily " -11/22/22 yellowish slough/drainage from wound bed, contacted Dr. Lajoyce Corners regarding this, will see if he wants BID dressing changes-- still pending response. Will f/up tomorrow. For now, doesn't look like worsening infection, can monitor closely.  Will undress wound and re-photograph tomorrow -11/23/22 wound looks about the same or maybe a little better today, Dr. Lajoyce Corners apparently out of town per patient, will change to BID silvadene and dressing changes for now, to have closer eyes on wound, but may want to touch base with Dr. Lajoyce Corners this week to check if he would like to adjust anything.  -7/3 PA with ortho evaluated her wound today, recommended just dry dressings for now, appreciate assistance -7/13 continue left leg wound dressing with aquacel with 4x4 on 7/11.  WBAT LLE 7/14 aquacel dressing needs to be changed daily! Twice daily if needed 7/18 patient reports Dr. Lajoyce Corners came to see wound yesterday, she reports plan to continue Aquacel and daily dressings, drainage appears to be improved 7/19 continue follow-up with Dr. Lajoyce Corners outpatient clinic 7. Fluids/Electrolytes/Nutrition:  Strict I/O with 1200 cc FR. Monitor intake. Continue supplements/vitamins.  8. Kleb/Proteus/Enterococcus wound infection: Cefazolin for 2 weeks completed 9.  T2DM: Hgb A1c-7.9 08/27/22 -Monitor BS ac/hs and use SSI for elevated BS. Titrate basal insulin as indicated.  -6/28 she is on Semglee 40 units in the morning and 20 units at bedtime.  Increase bedtime Semglee to 22 units --7/4 decrease Semglee to 38 units in AM, 16 in afternoon due to hypoglycemia event -7/5 Cbgs controlled, continue to monitor  -11/29/22 another hypoglycemic event this morning, mostly <200 during day, will further decrease Semglee to 36U qAM + 14U qPM to see if we have fewer morning lows-- monitor -11/30/22 CBG high this morning but apparently didn't get her AM dose of insulin yesterday-- so today was the first day of the reduced dose. Will have to monitor trend. Of note, pharmacy states her dose PTA was 45U daily, not BID dosing 7/12 semglee is 38u and 18u---persistent elevation  -adjust to 40 and 20 u today 7/14--continue current insulin given her reports of nausea, cover with SSI 7/15 will consult  DM coordinator, Glucose readings have been labile 7/16 DM coordinator advised adding NovoLog meal coverage 3 units 3 times daily with meals and reducing p.m. Semglee to 12 units every afternoon, orders placed made will monitor for response 7/17 CBGs  doing much better today, appreciate DM coordinator assistance 7/18 CBGs well-controlled continue current regimen  F/u with pcp for further monitoring CBG (last 3)  Recent Labs    12/11/22 1215 12/11/22 1751 12/12/22 0621  GLUCAP 83 181* 164*    10. CAD/biventricular CHF: On GDMT limited due to hypotension.   --Continue Lipitor 80mg  QD   -7/17 wts stable, monitor Filed Weights   12/09/22 1507 12/11/22 1305 12/11/22 1728  Weight: 86.6 kg 84.2 kg 82.2 kg    11. ESRD: HD TTS at the end of the day to help with tolerance of therapy.  Thrombosed AVF not salvageable.                --has been on regular diet-->? Low salt restrictions.   -6/28 nephrology following appreciate assistance, next HD tomorrow  -7/1 HD plans HD today due to orthopnea + SOB -7/2 patient has scheduled HD session again today, hopefully will have benefit to orthopnea although this may be anxiety related also  -7/12 volume mgt per nephrology  12. Depression with anxiety d/o, panic attacks: Now on Venlafaxine XR 75mg  QD with Lorazepam at night.--lorazepam not ordered, will reorder 0.5mg  QHS PRN, changed to scheduled starting 11/23/22 as mentioned above.   13. PAF: On Eliquis and amiodarone 200mg  QD for rate control.   -7/18-9 heart rate controlled in the 70s and 80s, continue current regimen     12/12/2022    4:20 AM 12/11/2022    7:22 PM 12/11/2022    5:53 PM  Vitals with BMI  Systolic 140 132 161  Diastolic 58 44 49  Pulse 74 73 76    14. Anemia of chronic disease:  -11/22/22 Hgb stable at 8.2 today, monitor labs  -7/2 nephrology ordering Aranesp 15.   H/o GIB due to stercoral ulcer: Avoid constipation. Continue Protonix 40mg  BID, miralax QD , Senokot 2 tabs BID, and  PRNs.   -11/23/22 last BM yesterday, monitor  -7/1 Small BM yesterday, increase miralax to BID for constipation   -7/2 last BM yesterday although small, continue sorbitol as needed  -7/3 sorbitol 45ml ordered  -7/5 LBM today, brown, changed miralax from BID to daily  -12/04/22   LBM  --needs bm today  -will order sorbitol 45ml, check Xray abdomen, PRN enema   -7/16 x-ray yesterday with moderate stool, she has had a few small bowel movements, will order enema for today after dialysis, increase miralax to BID'  -7/17 pt declines enema yesterday but plans to do today for constipation  -7/18 patient had 2 bowel movements yesterday, continue MiraLAX twice a day as she probably still has additional stool in her colon 16. Morbid Obesity. Dietary education  -Body mass index is 30.16 kg/m. 17.  Hypotension  -Taking midodrine 10mg  prior to dialysis  -6/29-30/24 BPs stable, monitor -7/2 consider more regular treatment of midodrine if orthostatic symptoms become limiting  -7/4-6 stable today, continue to monitor  -7/12--continue midodrine, volume mgt per renal  7/18 BP is a little higher last 24 hours with SBP's in 150s continue  to monitor 7/19 BP improved today, f/u PCP Vitals:   12/11/22 1330 12/11/22 1400 12/11/22 1430 12/11/22 1500  BP: (!) 153/95 (!) 171/117 131/60 (!) 146/62   12/11/22 1530 12/11/22 1600 12/11/22 1630 12/11/22 1700  BP: (!) 132/97 135/73 (!) 151/62 (!) 117/106   12/11/22 1715 12/11/22 1753 12/11/22 1922 12/12/22 0420  BP: (!) 126/99 (!) 141/49 (!) 132/44 (!) 140/58    18. Hypothyroidism: continue synthroid qAM 19.  Oxygen dependence/chronic respiratory failure: pt states she uses PRN O2 at home, mostly related to anxiety; ordered O2 2L via [place] to maintain SpO2 >90%. Pt with clear lung sounds, doubt need for emergent work up today. Monitor  -Okay to trial increasing time off nasal cannula O2, provide reassurance  -off O2 as tolerated, anxiety component  20. Nausea:  unclear etiology -recurrent  -has reglan and compazine prn  -abdomen benign appearing  -amiodarone effect but this is chronic med  -suspect anxiety component  -7/14 will change reglan to 5mg  tid scheduled   -hold rena-vit for now   -request input from nephrology regarding potential renal source   -ego support/manage anxiety  -check abdominal xray- increase stool   -Improved   LOS: 22 days A FACE TO FACE EVALUATION WAS PERFORMED  Fanny Dance 12/12/2022, 8:37 AM

## 2022-12-12 NOTE — Discharge Planning (Signed)
[facility] Patient Discharge Orders- [facility] CLINIC: [facility]  Patient's name: Susan [name] Admit/DC Dates: 11/10/22 - 12/12/2022  Discharge Diagnoses: Right BKA   Debility s/p CIR Acute on chronic anemia MDD/GAD  Aranesp: Given: Yes   Date and amount of last dose: 150 mcg on  11/10/22 Last Hgb: 11.9 PRBC's Given: No Date/# of units: None ESA dose for discharge: mircera 200 mcg IV q 2 weeks  IV Iron dose at discharge: None  Heparin change: No  EDW Change: Yes New EDW: 82.5kg  [place] Change: No  Access intervention/Change: No Details: Using [facility]  Hectorol/Calcitriol change: Stop VDRA (hypercalcemia)  Discharge Labs: Calcium 9.6 Phosphorus 3.8 Albumin 3.2 K+ 4.2  IV Antibiotics: No Details:  On Coumadin?: No Last INR: Next INR: Managed By:   OTHER/APPTS/LAB ORDERS:    D/C Meds to be reconciled by nurse after every discharge.  Completed By: Rogers Blocker, PA-C 12/12/2022, 1:51 PM  [facility] Pager: [phone]    Reviewed by: MD:______ RN_______

## 2022-12-13 DIAGNOSIS — N2581 Secondary hyperparathyroidism of renal origin: Secondary | ICD-10-CM | POA: Diagnosis not present

## 2022-12-13 DIAGNOSIS — N186 End stage renal disease: Secondary | ICD-10-CM | POA: Diagnosis not present

## 2022-12-13 DIAGNOSIS — Z992 Dependence on renal dialysis: Secondary | ICD-10-CM | POA: Diagnosis not present

## 2022-12-13 DIAGNOSIS — D631 Anemia in chronic kidney disease: Secondary | ICD-10-CM | POA: Diagnosis not present

## 2022-12-13 DIAGNOSIS — D689 Coagulation defect, unspecified: Secondary | ICD-10-CM | POA: Diagnosis not present

## 2022-12-14 ENCOUNTER — Telehealth: Payer: Self-pay | Admitting: Physician Assistant

## 2022-12-14 NOTE — Telephone Encounter (Signed)
Transition of Care - Initial Contact from Inpatient Facility  Date of discharge: 12/12/22 Date of contact:  12/14/22  Method: Phone Spoke to: Patient  Patient contacted to discuss transition of care from recent inpatient hospitalization. Patient was admitted to [facility] with discharge diagnosis of right BKA, acute on chronic anemia, and ESRD  The discharge medication list was reviewed. Patient understands the changes but reports she does not have renvela at home, will send in new Rx.  Patient reports she is not independent of ADLs but husband is helping her until home health is arranged, reports they called to set up an appt already. Confirmed she has DME as ordered.   F/u with Dr. Lajoyce Corners tomorrow, pt is aware  She does reports she felt sick at dialysis, reports he blood sugar dropped but she is feeling well now.   Patient will return to his/her outpatient HD unit on: 12/13/22  No other concerns at this time.  Rogers Blocker, PA-C 12/14/2022, 11:16 AM  [facility] Pager: [phone]

## 2022-12-15 ENCOUNTER — Encounter: Payer: Self-pay | Admitting: Orthopedic Surgery

## 2022-12-15 ENCOUNTER — Ambulatory Visit (INDEPENDENT_AMBULATORY_CARE_PROVIDER_SITE_OTHER): Payer: PPO | Admitting: Orthopedic Surgery

## 2022-12-15 DIAGNOSIS — Z89512 Acquired absence of left leg below knee: Secondary | ICD-10-CM | POA: Diagnosis not present

## 2022-12-15 DIAGNOSIS — R609 Edema, unspecified: Secondary | ICD-10-CM | POA: Diagnosis not present

## 2022-12-15 DIAGNOSIS — I87332 Chronic venous hypertension (idiopathic) with ulcer and inflammation of left lower extremity: Secondary | ICD-10-CM

## 2022-12-15 DIAGNOSIS — L98491 Non-pressure chronic ulcer of skin of other sites limited to breakdown of skin: Secondary | ICD-10-CM

## 2022-12-15 DIAGNOSIS — S88111S Complete traumatic amputation at level between knee and ankle, right lower leg, sequela: Secondary | ICD-10-CM

## 2022-12-15 NOTE — Progress Notes (Signed)
Office Visit Note   Patient: Susan [name]           Date of Birth: [birth year]           MRN: [id number] Visit Date: 12/15/2022              Requested by: Donita Brooks, MD [address] [place],  [place] [address] PCP: Donita Brooks, MD  Chief Complaint  Patient presents with   Left Leg - Follow-up    Registry pt #16 visit #16      HPI: Patient is a 72 year old woman Registry patient 16 visit #6 who is seen in follow-up for venous ulcer lateral left leg.  Patient recently has been discharged from inpatient rehab.  Most of the time she can transfer now without the [facility] lift.  Assessment & Plan: Visit Diagnoses:  1. Below-knee amputation of right lower extremity, sequela (HCC)   2. Ischemic ulcer, limited to breakdown of skin (HCC)   3. 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  They will leave the Adaptic in place change the 4 x 4 gauze and an Ace wrap daily.  Follow-Up Instructions: Return in about 1 week (around 12/22/2022).   Ortho Exam  Patient is alert, oriented, no adenopathy, well-dressed, normal affect, normal respiratory effort.  The wound healing has stalled, the wound bed has healthy granulation tissue, and patient presents for evaluation and application of Kerecis MariGen Micro graft. After informed consent a 10 blade knife was used to debride the skin and soft tissue to healthy viable bleeding granulation tissue.  Silver nitrate was used for hemostasis. The wound measures: 6 cm in length, 5cm  in width, 1 mm in depth, wound location lateral left calf Kerecis MariGen micro tissue graft 38 cm2 was applied, and there was no wastage.  Please see the photo below of the Lot number and expiration date. The micro tissue graft was covered with a nonadherent Adaptic dressing, bolstered with 4 x 4 gauze and secured with a compression  wrap.     Imaging: No results found.      Labs: Lab Results  Component Value Date   HGBA1C 7.9 (H) 08/27/2022   HGBA1C 8.9 (H) 05/02/2022   HGBA1C >15.5 (H) 01/20/2022   ESRSEDRATE 17 11/10/2022   CRP 3.6 (H) 11/10/2022   CRP 0.6 09/01/2022   CRP 0.7 08/22/2020   LABURIC 4.7 08/09/2020   LABURIC 9.1 (H) 10/12/2018   LABURIC 9.0 (H) 03/06/2018   REPTSTATUS 11/19/2022 FINAL 11/14/2022   GRAMSTAIN  11/14/2022    MODERATE WBC PRESENT,BOTH PMN AND MONONUCLEAR RARE SQUAMOUS EPITHELIAL CELLS PRESENT NO ORGANISMS SEEN Performed at [facility], [address] [place], [place] [address]    CULT  11/14/2022    FEW PROTEUS MIRABILIS RARE KLEBSIELLA PNEUMONIAE NO ANAEROBES ISOLATED FEW ENTEROCOCCUS FAECALIS    LABORGA PROTEUS MIRABILIS 11/14/2022   LABORGA KLEBSIELLA PNEUMONIAE 11/14/2022   LABORGA ENTEROCOCCUS FAECALIS 11/14/2022     Lab Results  Component Value Date   ALBUMIN 3.2 (L) 12/11/2022   ALBUMIN 2.9 (L) 12/06/2022   ALBUMIN 3.1 (L) 12/02/2022   PREALBUMIN 12 (L) 11/10/2022   PREALBUMIN 13 (L) 05/02/2022    Lab Results  Component Value Date   MG 1.9 11/20/2022   MG 1.9 09/04/2022   MG 1.8 09/03/2022   Lab Results  Component Value Date  VD25OH 42.14 05/02/2022    Lab Results  Component Value Date   PREALBUMIN 12 (L) 11/10/2022   PREALBUMIN 13 (L) 05/02/2022      Latest Ref Rng & Units 12/11/2022   11:29 AM 12/06/2022    3:38 PM 12/02/2022    1:58 PM  CBC EXTENDED  WBC 4.0 - 10.5 K/uL 5.9  6.8  7.0   RBC 3.87 - 5.11 MIL/uL 5.02  4.38  3.99   Hemoglobin 12.0 - 15.0 g/dL 46.9  62.9  9.7   HCT 52.8 - 46.0 % 39.2  34.7  31.2   Platelets 150 - 400 K/uL 255  275  250      There is no height or weight on file to calculate BMI.  Orders:  No orders of the defined types were placed in this encounter.  No orders of the defined types were placed in this encounter.    Procedures: No procedures performed  Clinical Data: No additional  findings.  ROS:  All other systems negative, except as noted in the HPI. Review of Systems  Objective: Vital Signs: There were no vitals taken for this visit.  Specialty Comments:  No specialty comments available.  PMFS History: Patient Active Problem List   Diagnosis Date Noted   Acute on chronic anemia 11/25/2022   Right below-knee amputee (HCC) 11/20/2022   Cellulitis of left lower extremity 11/14/2022   Maggot infestation 11/12/2022   Complicated wound infection 11/10/2022   Secondary hypercoagulable state (HCC) 09/04/2022   Right sided abdominal pain 08/31/2022   Constipation 06/07/2022   History of Clostridioides difficile colitis 06/06/2022   Below-knee amputation of right lower extremity (HCC) 06/06/2022   Diverticulitis 06/05/2022   Stercoral colitis 06/05/2022   C. 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) 04/07/2022   Allergy, unspecified, initial encounter 04/07/2022   Dependence on renal dialysis (HCC) 04/07/2022   Gout due to renal impairment, unspecified site 04/07/2022   Hypertensive heart and chronic kidney disease with heart failure and with stage 5 chronic kidney disease, or end stage renal disease (HCC) 04/07/2022   Personal history of transient ischemic attack (TIA), and cerebral infarction without residual deficits 04/07/2022   Renal osteodystrophy 04/07/2022   Venous stasis ulcer of right calf (HCC)  03/31/2022   Fistula, colovaginal 03/26/2022   Diarrhea 03/26/2022   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   Sepsis without acute organ dysfunction (HCC)    Bacteremia    Acute pancreatitis 02/01/2022   Abdominal pain 02/01/2022   SIRS (systemic inflammatory response syndrome) (HCC) 02/01/2022   Transaminitis 02/01/2022   History of anemia due to chronic kidney disease 02/01/2022   Paroxysmal atrial fibrillation (HCC) 02/01/2022   DKA (diabetic ketoacidosis) (HCC) 01/14/2022   NSTEMI (non-ST elevated myocardial infarction) (HCC) 03/05/2021   Acute renal failure superimposed on stage 4 chronic kidney disease (HCC) 08/22/2020   Hypoalbuminemia 05/25/2020   GERD (gastroesophageal reflux disease) 05/25/2020   Pressure injury of skin 05/17/2020   Acute on chronic combined systolic and diastolic  congestive heart failure (HCC) 03/07/2020   Type 2 diabetes mellitus with diabetic polyneuropathy, with long-term current use of insulin (HCC) 03/07/2020   Obesity, Class III, BMI 40-49.9 (morbid obesity) (HCC) 03/07/2020   Common bile duct (CBD) obstruction 05/28/2019   Benign neoplasm of ascending colon    Benign neoplasm of transverse colon    Benign neoplasm of descending colon    Benign neoplasm of sigmoid colon    Gastric polyps    Hyperkalemia 03/11/2019   Prolonged QT interval 03/11/2019   Acute blood loss anemia 03/11/2019   Onychomycosis 06/21/2018   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)    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 (HCC)    PVD (peripheral vascular disease) (HCC) 10/26/2017   E-coli UTI 07/27/2017   Hypothyroidism 07/27/2017   AKI (acute kidney injury) (HCC)    PAH (pulmonary artery hypertension) (HCC)    Impaired ambulation 07/19/2017   Nausea & vomiting 07/15/2017   Leg cramps 02/27/2017   Peripheral edema 01/12/2017   Diabetic neuropathy (HCC) 11/12/2016   CKD (chronic kidney disease), stage IV (HCC) 10/24/2015   Anemia of chronic  disease 10/03/2015   Generalized anxiety disorder 10/03/2015   Insomnia 10/03/2015   Hyperglycemia due to diabetes mellitus (HCC) 06/07/2015   Chronic diastolic CHF (congestive heart failure) (HCC) 06/07/2015   Non compliance with medical treatment 04/17/2014   Rotator cuff tear 03/14/2014   Class 3 obesity (HCC) 09/23/2013   Chronic HFrEF (heart failure with reduced ejection fraction) (HCC) 06/03/2013   Hypotension 12/25/2012   Urinary incontinence    MDD (major depressive disorder) 11/12/2010   RBBB (right bundle branch block)    Wide-complex tachycardia    Coronary artery disease    Hyperlipemia 01/22/2009   Essential hypertension 01/22/2009   Past Medical History:  Diagnosis Date   Anemia    hx   Anxiety    Arthritis    "generalized" (03/15/2014)   CAD (coronary artery disease)    MI in 2000 - MI  2007 - treated bare metal stent (no nuclear since then as 9/11)   Carotid artery disease (HCC)    Chronic diastolic heart failure (HCC)    a) ECHO (08/2013) EF 55-60% and RV function nl b) RHC (08/2013) RA 4, RV 30/5/7, PA 25/10 (16), PCWP 7, Fick CO/CI 6.3/2.7, PVR 1.5 WU, PA 61 and 66%   Daily headache    "~ every other day; since I fell in June" (03/15/2014)   Depression    Diabetic retinopathy (HCC)    Dyslipidemia    ESRD (end stage renal disease) (HCC)    Dialysis on Tues Thurs Sat   Exertional shortness of breath    History of kidney stones    HTN (hypertension)    Hypothyroidism    Obesity    Osteoarthritis    PAF (paroxysmal atrial fibrillation) (HCC)    Peripheral neuropathy    bilateral feet/hands   PONV (postoperative nausea and vomiting)    RBBB (right bundle branch block)    Old   Stroke (HCC)    mini strokes   Type II diabetes mellitus (HCC)    Type II, [name]. patient has omnipod insulin pump with Novolin R Insulin    Family History  Problem Relation Age of Onset   Heart attack Mother 30    Past Surgical History:  Procedure  Laterality Date   A/V FISTULAGRAM Left 11/07/2022   Procedure: A/V Fistulagram;  Surgeon: Lenell Antu,  Rande Brunt, MD;  Location: [facility];  Service: Cardiovascular;  Laterality: Left;   ABDOMINAL HYSTERECTOMY  1980's   AMPUTATION Right 02/24/2018   Procedure: RIGHT FOOT GREAT TOE AND 2ND TOE AMPUTATION;  Surgeon: Nadara Mustard, MD;  Location: [facility] OR;  Service: Orthopedics;  Laterality: Right;   AMPUTATION Right 04/30/2018   Procedure: RIGHT TRANSMETATARSAL AMPUTATION;  Surgeon: Nadara Mustard, MD;  Location: [facility] OR;  Service: Orthopedics;  Laterality: Right;   AMPUTATION Right 05/02/2022   Procedure: RIGHT BELOW KNEE AMPUTATION;  Surgeon: Nadara Mustard, MD;  Location: [facility] OR;  Service: Orthopedics;  Laterality: Right;   APPLICATION OF WOUND VAC Right 06/13/2022   Procedure: APPLICATION OF WOUND VAC;  Surgeon: Nadara Mustard, MD;  Location: [facility] OR;  Service: Orthopedics;  Laterality: Right;   APPLICATION OF WOUND VAC Left 11/14/2022   Procedure: APPLICATION OF WOUND VAC;  Surgeon: Nadara Mustard, MD;  Location: [facility] OR;  Service: Orthopedics;  Laterality: Left;   AV FISTULA PLACEMENT Left 04/02/2022   Procedure: LEFT ARM ARTERIOVENOUS (AV) FISTULA CREATION;  Surgeon: Nada Libman, MD;  Location: [facility] OR;  Service: Vascular;  Laterality: Left;  PERIPHERAL NERVE BLOCK   BASCILIC VEIN TRANSPOSITION Left 07/31/2022   Procedure: LEFT ARM SECOND STAGE BASILIC VEIN TRANSPOSITION;  Surgeon: Nada Libman, MD;  Location: [facility] OR;  Service: Vascular;  Laterality: Left;   BIOPSY  05/27/2020   Procedure: BIOPSY;  Surgeon: Lanelle Bal, DO;  Location: [facility];  Service: Endoscopy;;   CATARACT EXTRACTION, BILATERAL Bilateral ?2013   COLONOSCOPY W/ POLYPECTOMY     COLONOSCOPY WITH PROPOFOL N/A 03/13/2019   Procedure: COLONOSCOPY WITH PROPOFOL;  Surgeon: Beverley Fiedler, MD;  Location: [facility] ENDOSCOPY;  Service: Gastroenterology;  Laterality: N/A;   CORONARY ANGIOPLASTY WITH STENT PLACEMENT  1999; 2007   "1 +  1"   ERCP N/A 02/03/2022   Procedure: ENDOSCOPIC RETROGRADE CHOLANGIOPANCREATOGRAPHY (ERCP);  Surgeon: Jeani Hawking, MD;  Location: [facility] ENDOSCOPY;  Service: Gastroenterology;  Laterality: N/A;   ESOPHAGOGASTRODUODENOSCOPY (EGD) WITH PROPOFOL N/A 03/13/2019   Procedure: ESOPHAGOGASTRODUODENOSCOPY (EGD) WITH PROPOFOL;  Surgeon: Beverley Fiedler, MD;  Location: [facility] ENDOSCOPY;  Service: Gastroenterology;  Laterality: N/A;   ESOPHAGOGASTRODUODENOSCOPY (EGD) WITH PROPOFOL N/A 05/27/2020   Procedure: ESOPHAGOGASTRODUODENOSCOPY (EGD) WITH PROPOFOL;  Surgeon: Lanelle Bal, DO;  [facility];  Service: Endoscopy;  Laterality: N/A;   ESOPHAGOGASTRODUODENOSCOPY (EGD) WITH PROPOFOL N/A 09/03/2022   Procedure: ESOPHAGOGASTRODUODENOSCOPY (EGD) WITH PROPOFOL;  Surgeon: Jeani Hawking, MD;  Location: [facility] ENDOSCOPY;  Service: Gastroenterology;  Laterality: N/A;   EYE SURGERY Bilateral    lazer   FLEXIBLE SIGMOIDOSCOPY N/A 05/23/2022   Procedure: FLEXIBLE SIGMOIDOSCOPY;  Surgeon: Jeani Hawking, MD;  Location: [facility] ENDOSCOPY;  Service: Gastroenterology;  Laterality: N/A;   FLEXIBLE SIGMOIDOSCOPY N/A 05/24/2022   Procedure: FLEXIBLE SIGMOIDOSCOPY;  Surgeon: Imogene Burn, MD;  Location: [facility] ENDOSCOPY;  Service: Gastroenterology;  Laterality: N/A;   FLEXIBLE SIGMOIDOSCOPY N/A 09/03/2022   Procedure: FLEXIBLE SIGMOIDOSCOPY;  Surgeon: Jeani Hawking, MD;  Location: [facility] ENDOSCOPY;  Service: Gastroenterology;  Laterality: N/A;   HEMOSTASIS CLIP PLACEMENT  03/13/2019   Procedure: HEMOSTASIS CLIP PLACEMENT;  Surgeon: Beverley Fiedler, MD;  Location: [facility] ENDOSCOPY;  Service: Gastroenterology;;   HEMOSTASIS CLIP PLACEMENT  05/23/2022   Procedure: HEMOSTASIS CLIP PLACEMENT;  Surgeon: Jeani Hawking, MD;  Location: [facility] ENDOSCOPY;  Service: Gastroenterology;;   HEMOSTASIS CONTROL  05/24/2022   Procedure: HEMOSTASIS CONTROL;  Surgeon: Imogene Burn, MD;  Location: [facility]  ENDOSCOPY;  Service: Gastroenterology;;   HOT HEMOSTASIS N/A  05/23/2022   Procedure: HOT HEMOSTASIS (ARGON PLASMA COAGULATION/BICAP);  Surgeon: Jeani Hawking, MD;  Location: [facility] ENDOSCOPY;  Service: Gastroenterology;  Laterality: N/A;   I & D EXTREMITY Left 05/05/2022   Procedure: IRRIGATION AND DEBRIDEMENT LEFT ARM AV FISTULA;  Surgeon: Cephus Shelling, MD;  Location: [facility] OR;  Service: Vascular;  Laterality: Left;   I & D EXTREMITY N/A 11/14/2022   Procedure: IRRIGATION AND DEBRIDEMENT OF LOWER EXTREMITY WOUND;  Surgeon: Nadara Mustard, MD;  Location: [facility] OR;  Service: Orthopedics;  Laterality: N/A;   INSERTION OF DIALYSIS CATHETER Right 04/02/2022   Procedure: INSERTION OF TUNNELED DIALYSIS CATHETER;  Surgeon: Nada Libman, MD;  Location: [facility] OR;  Service: Vascular;  Laterality: Right;   KNEE ARTHROSCOPY Left 10/25/2006   POLYPECTOMY  03/13/2019   Procedure: POLYPECTOMY;  Surgeon: Beverley Fiedler, MD;  Location: [facility]OS[facility];  Service: Gastroenterology;;   REMOVAL OF STONES  02/03/2022   Procedure: REMOVAL OF STONES;  Surgeon: Jeani Hawking, MD;  Location: [facility] ENDOSCOPY;  Service: Gastroenterology;;   REVISON OF ARTERIOVENOUS FISTULA Left 08/20/2022   Procedure: REVISON OF LEFT ARM ARTERIOVENOUS FISTULA;  Surgeon: Nada Libman, MD;  Location: [facility];  Service: Vascular;  Laterality: Left;   RIGHT HEART CATH N/A 07/24/2017   Procedure: RIGHT HEART CATH;  Surgeon: Dolores Patty, MD;  Location: [facility];  Service: Cardiovascular;  Laterality: N/A;   RIGHT HEART CATHETERIZATION N/A 09/22/2013   Procedure: RIGHT HEART CATH;  Surgeon: Dolores Patty, MD;  Location: [facility] CATH LAB;  Service: Cardiovascular;  Laterality: N/A;   SHOULDER ARTHROSCOPY WITH OPEN ROTATOR CUFF REPAIR Right 03/14/2014   Procedure: RIGHT SHOULDER ARTHROSCOPY WITH BICEPS RELEASE, OPEN SUBSCAPULA REPAIR, OPEN SUPRASPINATUS REPAIR.;  Surgeon: Cammy Copa, MD;  Location: [facility] OR;  Service: Orthopedics;  Laterality: Right;   SPHINCTEROTOMY  02/03/2022   Procedure:  SPHINCTEROTOMY;  Surgeon: Jeani Hawking, MD;  Location: [facility] ENDOSCOPY;  Service: Gastroenterology;;   STUMP REVISION Right 06/13/2022   Procedure: REVISION RIGHT BELOW KNEE AMPUTATION;  Surgeon: Nadara Mustard, MD;  Location: [facility] OR;  Service: Orthopedics;  Laterality: Right;   TEE WITHOUT CARDIOVERSION N/A 02/04/2022   Procedure: TRANSESOPHAGEAL ECHOCARDIOGRAM (TEE);  Surgeon: Dolores Patty, MD;  Location: [facility] ENDOSCOPY;  Service: Cardiovascular;  Laterality: N/A;   THROMBECTOMY W/ EMBOLECTOMY Left 08/20/2022   Procedure: THROMBECTOMY OF LEFT ARM ARTERIOVENOUS FISTULA;  Surgeon: Nada Libman, MD;  Location: [facility] OR;  Service: Vascular;  Laterality: Left;   TOE AMPUTATION Right 02/24/2018   GREAT TOE AND 2ND TOE AMPUTATION   TUBAL LIGATION  1970's   Social History   Occupational History    Employer: UNEMPLOYED  Tobacco Use   Smoking status: Former    Current packs/day: 0.00    Average packs/day: 3.0 packs/day for 32.0 years (96.0 ttl pk-yrs)    Types: Cigarettes    Start date: 10/24/1965    Quit date: 10/24/1997    Years since quitting: 25.1   Smokeless tobacco: Never  Vaping Use   Vaping status: Never Used  Substance and Sexual Activity   Alcohol use: Not Currently    Comment: "might have 2-3 daiquiris in the summer"   Drug use: No   Sexual activity: Not Currently    Birth control/protection: Surgical    Comment: Hysterectomy

## 2022-12-16 ENCOUNTER — Telehealth: Payer: Self-pay | Admitting: Family Medicine

## 2022-12-16 DIAGNOSIS — N2581 Secondary hyperparathyroidism of renal origin: Secondary | ICD-10-CM | POA: Diagnosis not present

## 2022-12-16 DIAGNOSIS — N186 End stage renal disease: Secondary | ICD-10-CM | POA: Diagnosis not present

## 2022-12-16 DIAGNOSIS — Z992 Dependence on renal dialysis: Secondary | ICD-10-CM | POA: Diagnosis not present

## 2022-12-16 DIAGNOSIS — D689 Coagulation defect, unspecified: Secondary | ICD-10-CM | POA: Diagnosis not present

## 2022-12-16 DIAGNOSIS — I5032 Chronic diastolic (congestive) heart failure: Secondary | ICD-10-CM | POA: Diagnosis not present

## 2022-12-16 NOTE — Telephone Encounter (Signed)
Received call from [place] with [facility] to notify provider of a delay in starter care until July 26,2024.  Please advise with questions or concerns at [phone].

## 2022-12-19 ENCOUNTER — Encounter (HOSPITAL_COMMUNITY): Payer: PPO

## 2022-12-19 ENCOUNTER — Encounter: Payer: PPO | Admitting: Vascular Surgery

## 2022-12-19 DIAGNOSIS — I132 Hypertensive heart and chronic kidney disease with heart failure and with stage 5 chronic kidney disease, or end stage renal disease: Secondary | ICD-10-CM | POA: Diagnosis not present

## 2022-12-19 DIAGNOSIS — E11319 Type 2 diabetes mellitus with unspecified diabetic retinopathy without macular edema: Secondary | ICD-10-CM | POA: Diagnosis not present

## 2022-12-19 DIAGNOSIS — E1151 Type 2 diabetes mellitus with diabetic peripheral angiopathy without gangrene: Secondary | ICD-10-CM | POA: Diagnosis not present

## 2022-12-19 DIAGNOSIS — I83222 Varicose veins of left lower extremity with both ulcer of calf and inflammation: Secondary | ICD-10-CM | POA: Diagnosis not present

## 2022-12-19 DIAGNOSIS — I252 Old myocardial infarction: Secondary | ICD-10-CM | POA: Diagnosis not present

## 2022-12-19 DIAGNOSIS — N186 End stage renal disease: Secondary | ICD-10-CM | POA: Diagnosis not present

## 2022-12-19 DIAGNOSIS — I5042 Chronic combined systolic (congestive) and diastolic (congestive) heart failure: Secondary | ICD-10-CM | POA: Diagnosis not present

## 2022-12-19 DIAGNOSIS — F411 Generalized anxiety disorder: Secondary | ICD-10-CM | POA: Diagnosis not present

## 2022-12-19 DIAGNOSIS — I480 Paroxysmal atrial fibrillation: Secondary | ICD-10-CM | POA: Diagnosis not present

## 2022-12-19 DIAGNOSIS — I2721 Secondary pulmonary arterial hypertension: Secondary | ICD-10-CM | POA: Diagnosis not present

## 2022-12-19 DIAGNOSIS — M1030 Gout due to renal impairment, unspecified site: Secondary | ICD-10-CM | POA: Diagnosis not present

## 2022-12-19 DIAGNOSIS — I5082 Biventricular heart failure: Secondary | ICD-10-CM | POA: Diagnosis not present

## 2022-12-19 DIAGNOSIS — J9610 Chronic respiratory failure, unspecified whether with hypoxia or hypercapnia: Secondary | ICD-10-CM | POA: Diagnosis not present

## 2022-12-19 DIAGNOSIS — L89322 Pressure ulcer of left buttock, stage 2: Secondary | ICD-10-CM | POA: Diagnosis not present

## 2022-12-19 DIAGNOSIS — F329 Major depressive disorder, single episode, unspecified: Secondary | ICD-10-CM | POA: Diagnosis not present

## 2022-12-19 DIAGNOSIS — D631 Anemia in chronic kidney disease: Secondary | ICD-10-CM | POA: Diagnosis not present

## 2022-12-19 DIAGNOSIS — E1142 Type 2 diabetes mellitus with diabetic polyneuropathy: Secondary | ICD-10-CM | POA: Diagnosis not present

## 2022-12-19 DIAGNOSIS — I4510 Unspecified right bundle-branch block: Secondary | ICD-10-CM | POA: Diagnosis not present

## 2022-12-19 DIAGNOSIS — F5104 Psychophysiologic insomnia: Secondary | ICD-10-CM | POA: Diagnosis not present

## 2022-12-19 DIAGNOSIS — L97822 Non-pressure chronic ulcer of other part of left lower leg with fat layer exposed: Secondary | ICD-10-CM | POA: Diagnosis not present

## 2022-12-19 DIAGNOSIS — I2510 Atherosclerotic heart disease of native coronary artery without angina pectoris: Secondary | ICD-10-CM | POA: Diagnosis not present

## 2022-12-19 DIAGNOSIS — N2581 Secondary hyperparathyroidism of renal origin: Secondary | ICD-10-CM | POA: Diagnosis not present

## 2022-12-19 DIAGNOSIS — M159 Polyosteoarthritis, unspecified: Secondary | ICD-10-CM | POA: Diagnosis not present

## 2022-12-19 DIAGNOSIS — F410 Panic disorder [episodic paroxysmal anxiety] without agoraphobia: Secondary | ICD-10-CM | POA: Diagnosis not present

## 2022-12-22 ENCOUNTER — Ambulatory Visit (INDEPENDENT_AMBULATORY_CARE_PROVIDER_SITE_OTHER): Payer: PPO | Admitting: Orthopedic Surgery

## 2022-12-22 DIAGNOSIS — Z89511 Acquired absence of right leg below knee: Secondary | ICD-10-CM

## 2022-12-22 DIAGNOSIS — S88111S Complete traumatic amputation at level between knee and ankle, right lower leg, sequela: Secondary | ICD-10-CM

## 2022-12-22 DIAGNOSIS — I87332 Chronic venous hypertension (idiopathic) with ulcer and inflammation of left lower extremity: Secondary | ICD-10-CM

## 2022-12-23 ENCOUNTER — Inpatient Hospital Stay: Payer: PPO | Admitting: Internal Medicine

## 2022-12-23 DIAGNOSIS — M6281 Muscle weakness (generalized): Secondary | ICD-10-CM | POA: Diagnosis not present

## 2022-12-23 DIAGNOSIS — Z992 Dependence on renal dialysis: Secondary | ICD-10-CM | POA: Diagnosis not present

## 2022-12-23 DIAGNOSIS — N186 End stage renal disease: Secondary | ICD-10-CM | POA: Diagnosis not present

## 2022-12-23 DIAGNOSIS — D689 Coagulation defect, unspecified: Secondary | ICD-10-CM | POA: Diagnosis not present

## 2022-12-23 DIAGNOSIS — N2581 Secondary hyperparathyroidism of renal origin: Secondary | ICD-10-CM | POA: Diagnosis not present

## 2022-12-23 DIAGNOSIS — K219 Gastro-esophageal reflux disease without esophagitis: Secondary | ICD-10-CM | POA: Diagnosis not present

## 2022-12-23 DIAGNOSIS — K626 Ulcer of anus and rectum: Secondary | ICD-10-CM | POA: Diagnosis not present

## 2022-12-23 DIAGNOSIS — I5042 Chronic combined systolic (congestive) and diastolic (congestive) heart failure: Secondary | ICD-10-CM | POA: Diagnosis not present

## 2022-12-23 DIAGNOSIS — I5043 Acute on chronic combined systolic (congestive) and diastolic (congestive) heart failure: Secondary | ICD-10-CM | POA: Diagnosis not present

## 2022-12-23 DIAGNOSIS — J9611 Chronic respiratory failure with hypoxia: Secondary | ICD-10-CM | POA: Diagnosis not present

## 2022-12-24 DIAGNOSIS — R748 Abnormal levels of other serum enzymes: Secondary | ICD-10-CM | POA: Diagnosis not present

## 2022-12-24 DIAGNOSIS — G609 Hereditary and idiopathic neuropathy, unspecified: Secondary | ICD-10-CM | POA: Diagnosis not present

## 2022-12-24 DIAGNOSIS — Z9641 Presence of insulin pump (external) (internal): Secondary | ICD-10-CM | POA: Diagnosis not present

## 2022-12-24 DIAGNOSIS — I120 Hypertensive chronic kidney disease with stage 5 chronic kidney disease or end stage renal disease: Secondary | ICD-10-CM | POA: Diagnosis not present

## 2022-12-24 DIAGNOSIS — Z992 Dependence on renal dialysis: Secondary | ICD-10-CM | POA: Diagnosis not present

## 2022-12-24 DIAGNOSIS — N186 End stage renal disease: Secondary | ICD-10-CM | POA: Diagnosis not present

## 2022-12-24 DIAGNOSIS — E11319 Type 2 diabetes mellitus with unspecified diabetic retinopathy without macular edema: Secondary | ICD-10-CM | POA: Diagnosis not present

## 2022-12-24 DIAGNOSIS — E11621 Type 2 diabetes mellitus with foot ulcer: Secondary | ICD-10-CM | POA: Diagnosis not present

## 2022-12-24 DIAGNOSIS — E1165 Type 2 diabetes mellitus with hyperglycemia: Secondary | ICD-10-CM | POA: Diagnosis not present

## 2022-12-24 DIAGNOSIS — R809 Proteinuria, unspecified: Secondary | ICD-10-CM | POA: Diagnosis not present

## 2022-12-24 DIAGNOSIS — I2510 Atherosclerotic heart disease of native coronary artery without angina pectoris: Secondary | ICD-10-CM | POA: Diagnosis not present

## 2022-12-24 DIAGNOSIS — E7800 Pure hypercholesterolemia, unspecified: Secondary | ICD-10-CM | POA: Diagnosis not present

## 2022-12-24 DIAGNOSIS — E039 Hypothyroidism, unspecified: Secondary | ICD-10-CM | POA: Diagnosis not present

## 2022-12-24 DIAGNOSIS — I10 Essential (primary) hypertension: Secondary | ICD-10-CM | POA: Diagnosis not present

## 2022-12-25 DIAGNOSIS — N186 End stage renal disease: Secondary | ICD-10-CM | POA: Diagnosis not present

## 2022-12-25 DIAGNOSIS — D689 Coagulation defect, unspecified: Secondary | ICD-10-CM | POA: Diagnosis not present

## 2022-12-25 DIAGNOSIS — N2581 Secondary hyperparathyroidism of renal origin: Secondary | ICD-10-CM | POA: Diagnosis not present

## 2022-12-25 DIAGNOSIS — Z992 Dependence on renal dialysis: Secondary | ICD-10-CM | POA: Diagnosis not present

## 2022-12-26 ENCOUNTER — Other Ambulatory Visit (HOSPITAL_COMMUNITY): Payer: Self-pay | Admitting: Family Medicine

## 2022-12-26 ENCOUNTER — Telehealth: Payer: Self-pay | Admitting: Infectious Diseases

## 2022-12-26 ENCOUNTER — Other Ambulatory Visit: Payer: Self-pay

## 2022-12-26 ENCOUNTER — Encounter: Payer: Self-pay | Admitting: Family Medicine

## 2022-12-26 ENCOUNTER — Ambulatory Visit (INDEPENDENT_AMBULATORY_CARE_PROVIDER_SITE_OTHER): Payer: PPO | Admitting: Family Medicine

## 2022-12-26 ENCOUNTER — Telehealth: Payer: Self-pay | Admitting: Family Medicine

## 2022-12-26 ENCOUNTER — Telehealth: Payer: Self-pay | Admitting: Internal Medicine

## 2022-12-26 VITALS — BP 122/68 | HR 75 | Temp 97.6°F | Ht 65.0 in | Wt 181.0 lb

## 2022-12-26 DIAGNOSIS — I480 Paroxysmal atrial fibrillation: Secondary | ICD-10-CM | POA: Diagnosis not present

## 2022-12-26 DIAGNOSIS — Z794 Long term (current) use of insulin: Secondary | ICD-10-CM | POA: Diagnosis not present

## 2022-12-26 DIAGNOSIS — E119 Type 2 diabetes mellitus without complications: Secondary | ICD-10-CM

## 2022-12-26 DIAGNOSIS — I83029 Varicose veins of left lower extremity with ulcer of unspecified site: Secondary | ICD-10-CM

## 2022-12-26 DIAGNOSIS — F339 Major depressive disorder, recurrent, unspecified: Secondary | ICD-10-CM

## 2022-12-26 DIAGNOSIS — N186 End stage renal disease: Secondary | ICD-10-CM | POA: Diagnosis not present

## 2022-12-26 DIAGNOSIS — L97929 Non-pressure chronic ulcer of unspecified part of left lower leg with unspecified severity: Secondary | ICD-10-CM

## 2022-12-26 MED ORDER — VENLAFAXINE HCL ER 75 MG PO CP24: 75.0000 mg | ORAL_CAPSULE | Freq: Every day | ORAL | 3 refills | Status: DC

## 2022-12-26 MED ORDER — CLONAZEPAM 0.5 MG PO TBDP
0.5000 mg | ORAL_TABLET | Freq: Two times a day (BID) | ORAL | 1 refills | Status: DC
Start: 2022-12-26 — End: 2022-12-29

## 2022-12-26 NOTE — Telephone Encounter (Signed)
error 

## 2022-12-26 NOTE — Telephone Encounter (Signed)
Patient called to follow up on today's appointment; requesting call back with instructions of how provider wants her to take clonazePAM (KLONOPIN) 0.5 MG disintegrating tablet .  Please advise at [phone].

## 2022-12-26 NOTE — Telephone Encounter (Signed)
Susan [name] called to check the importance of her upcoming appointment and what it was for since she does not like our parking lot. I informed her it was a hospital follow-up related to her recent hospital visit, confirmed with triage the importance of this appointment, and let her know the parking deck is complete. After informing her Dr. Thedore Mins would like her to keep the appointment she decided to cancel for now and declined rescheduling.

## 2022-12-26 NOTE — Progress Notes (Signed)
Subjective:   Admit date: 11/20/2022 Discharge date: 12/12/2022   Discharge Diagnoses:  Principal Problem:   Right below-knee amputee [facility]) Active Problems:   Acute on chronic anemia DVT prophylaxis Pain management Mood stabilization Klebsiella/Proteus/Enterococcus wound infection Type 2 diabetes mellitus CAD/biventricular CHF End-stage renal disease with hemodialysis Morbid obesity Hypotension Hypothyroidism Chronic respiratory failure   Discharged Condition: Stable   Significant Diagnostic Studies:  Imaging Results  DG Abd 2 Views   Result Date: 12/08/2022 CLINICAL DATA:  Constipation. EXAM: ABDOMEN - 2 VIEW COMPARISON:  06/12/2022. FINDINGS: The bowel gas pattern is normal. There is no evidence of free air. A moderate amount of retained stool is present in the colon and rectum. No radio-opaque calculi. Vascular calcifications are noted in the pelvis and bilateral lower extremities. IMPRESSION: Moderate amount of retained stool in the colon and rectum. Electronically Signed   By: Thornell Sartorius M.D.   On: 12/08/2022 21:13    DG CHEST PORT 1 VIEW   Result Date: 11/17/2022 CLINICAL DATA:  Shortness of breath EXAM: PORTABLE CHEST 1 VIEW COMPARISON:  Radiograph 09/01/2022 FINDINGS: Right IJ CVC tip in the low SVC. Stable cardiomegaly. Coronary stenting. Aortic atherosclerotic calcification. No focal consolidation, pleural effusion, or pneumothorax. No displaced rib fractures. IMPRESSION: No active disease.  Cardiomegaly. Electronically Signed   By: Minerva Fester M.D.   On: 11/17/2022 20:38       Labs:  Basic Metabolic Panel: Last Labs     Recent Labs  Lab 12/06/22 1539  NA 132*  K 3.7  CL 95*  CO2 26  GLUCOSE 273*  BUN 70*  CREATININE 4.85*  CALCIUM 9.1  PHOS 4.1        CBC: Last Labs     Recent Labs  Lab 12/06/22 1538  WBC 6.8  HGB 10.4*  HCT 34.7*  MCV 79.2*  PLT 275        CBG: Last Labs         Recent Labs  Lab 12/09/22 2054  12/09/22 2211 12/10/22 0620 12/10/22 1143 12/10/22 1630  GLUCAP 127* 195* 125* 120* 126*      Family history.  Mother with myocardial infarction.  Denies any colon cancer esophageal cancer or rectal cancer   Brief HPI:   Susan [name] is a 72 y.o. 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 difficile colitis, PAF on Eliquis history of tobacco use in the past with chronic respiratory failure oxygen dependent, end-stage renal disease with hemodialysis, bilateral lower extremity ulcers status post right BKA 12/23 complicated by wound dehiscence status post revision and left shin venous ulcer.  She was at a skilled nursing facility since BKA revision 1/24 with multiple admissions due to medical issues.  She was unable to bear weight on the leg and eventually discharged to home 09/23/2022 doing a [employer] lift.  She developed sacral decubitus had issues with volume overload issues with fistula status post thrombectomy 08/20/2022 and developed new traumatic ischemic ulcer on the left medial calf 5/24 treated with Unna boots followed by Kerecis micro graft.  She was seen in the office by Dr. Lajoyce Corners 11/10/2022 found to have increasing necrotic tissue with maggots in the wound and sent to the hospital for admission and management.   She was started on IV Zosyn and vancomycin underwent irrigation debridement placement of wound VAC 6/21 by Dr.  Lajoyce Corners.  She was found to have anhedonia, acute on chronic insomnia episodes of panic attacks.  Psychiatry services consulted for input 6/18 due to longstanding history of MDD/GAD with panic attacks and patient transition to venlafaxine XR.  She did express wishes to stop HD transfer to hospice of leg cannot be salvaged.  Palliative care consulted recommended to  discuss goals of care.  Her blood cultures were negative wound culture grew out Proteus Mirabella's Klebsiella Enterococcus.  Antibiotics changed to cefazolin recommendations to complete a 2-week course per infectious disease.  Hemodialysis ongoing.  Therapy evaluations completed and recommendations of physical medicine inpatient rehab services due to debility     Hospital Course: [name] was admitted to rehab 11/20/2022 for inpatient therapies to consist of PT, ST and OT at least three hours five days a week. Past admission physiatrist, therapy team and rehab RN have worked together to provide customized collaborative inpatient rehab.  Pertaining to patient's right BKA revision 1/24 and left leg wound followed by orthopedic services and wound care as directed.  She remained on chronic Eliquis/amiodarone for PAF cardiac rate controlled monitoring for any bleeding episodes..  Pain management with the use of Robaxin as well as oxycodone.  Mood stabilization with history of anxiety depression emotional support provided maintained on Rozerem at bedtime as well as scheduled low-dose Klonopin.  Lipitor ongoing for hyperlipidemia.  CAD with biventricular CHF monitoring for any signs of fluid overload she did have some hypotension maintained on ProAmatine.  End-stage renal disease with hemodialysis as directed.  History of GI bleed with Protonix twice daily.  Chronic anemia latest hemoglobin 8.2.  Morbid obesity BMI 30.7 for dietary follow-up.  Oxygen dependent for chronic respiratory failure monitoring of oxygen saturations.  Blood sugars overall controlled on insulin therapy.     Blood pressures were monitored on TID basis and soft and monitored   Diabetes has been monitored with ac/hs CBG checks and SSI was use prn for tighter BS control.   12/26/22 Patient is here today for follow-up.  She saw her endocrinologist on July 31 and at that appointment her A1c was 7 per her report.  This is remarkable.  I  believe this is likely due to her frequent hospitalizations and parenteral control of her insulin administration.  She was discharged home on Lantus 12 units once daily and sliding scale insulin with NovoLog q. ACH S.  Husband has been doing his home however he been administering 16 to 40 units Lantus pending with sugars up and seem little bit confused on the sliding scale how this works.  I do not believe they have been using the sliding scale 4 times a day based on his report.  They have also stopped venlafaxine which was started in the hospital for depression.  They are confused as to whether they should resume Trintellix which she was previously taking.  She is no longer suicidal.  She is back to her baseline and wants to continue dialysis.  She is receiving dialysis 3 days a week.  She is following up with Dr. Lajoyce Corners for wound care once a week.  I removed the dressing on her leg today.  The skin shows no erythema no exudate and there is no evidence of any necrotic debris. Past Medical History:  Diagnosis Date   Anemia    hx   Anxiety    Arthritis    "generalized" (03/15/2014)   CAD (coronary artery disease)    MI in 2000 - MI  2007 - treated bare metal stent (no nuclear since then as 9/11)   Carotid artery disease (HCC)    Chronic diastolic heart failure (HCC)    a) ECHO (08/2013) EF 55-60% and RV function nl b) RHC (08/2013) RA 4, RV 30/5/7, PA 25/10 (16), PCWP 7, Fick CO/CI 6.3/2.7, PVR 1.5 WU, PA 61 and 66%   Daily headache    "~ every other day; since I fell in June" (03/15/2014)   Depression    Diabetic retinopathy (HCC)    Dyslipidemia    ESRD (end stage renal disease) (HCC)    Dialysis on Tues Thurs Sat   Exertional shortness of breath    History of kidney stones    HTN (hypertension)    Hypothyroidism    Obesity    Osteoarthritis    PAF (paroxysmal atrial fibrillation) (HCC)    Peripheral neuropathy    bilateral feet/hands   PONV (postoperative nausea and vomiting)    RBBB  (right bundle branch block)    Old   Stroke (HCC)    mini strokes   Type II diabetes mellitus (HCC)    Type II, [name]. patient has omnipod insulin pump with Novolin R Insulin   Past Surgical History:  Procedure Laterality Date   A/V FISTULAGRAM Left 11/07/2022   Procedure: A/V Fistulagram;  Surgeon: Leonie Douglas, MD;  [facility]: [facility];  Service: Cardiovascular;  Laterality: Left;   ABDOMINAL HYSTERECTOMY  1980's   AMPUTATION Right 02/24/2018   Procedure: RIGHT FOOT GREAT TOE AND 2ND TOE AMPUTATION;  Surgeon: Nadara Mustard, MD;  Location: [facility] OR;  Service: Orthopedics;  Laterality: Right;   AMPUTATION Right 04/30/2018   Procedure: RIGHT TRANSMETATARSAL AMPUTATION;  Surgeon: Nadara Mustard, MD;  Location: [facility] OR;  Service: Orthopedics;  Laterality: Right;   AMPUTATION Right 05/02/2022   Procedure: RIGHT BELOW KNEE AMPUTATION;  Surgeon: Nadara Mustard, MD;  Location: [facility] OR;  Service: Orthopedics;  Laterality: Right;   APPLICATION OF WOUND VAC Right 06/13/2022   Procedure: APPLICATION OF WOUND VAC;  Surgeon: Nadara Mustard, MD;  Location: [facility] OR;  Service: Orthopedics;  Laterality: Right;   APPLICATION OF WOUND VAC Left 11/14/2022   Procedure: APPLICATION OF WOUND VAC;  Surgeon: Nadara Mustard, MD;  Location: [facility] OR;  Service: Orthopedics;  Laterality: Left;   AV FISTULA PLACEMENT Left 04/02/2022   Procedure: LEFT ARM ARTERIOVENOUS (AV) FISTULA CREATION;  Surgeon: Nada Libman, MD;  Location: [facility] OR;  Service: Vascular;  Laterality: Left;  PERIPHERAL NERVE BLOCK   BASCILIC VEIN TRANSPOSITION Left 07/31/2022   Procedure: LEFT ARM SECOND STAGE BASILIC VEIN TRANSPOSITION;  Surgeon: Nada Libman, MD;  Location: [facility];  Service: Vascular;  Laterality: Left;   BIOPSY  05/27/2020   Procedure: BIOPSY;  Surgeon: Lanelle Bal, DO;  [facility]: [facility];  Service: Endoscopy;;   CATARACT EXTRACTION, BILATERAL Bilateral ?2013   COLONOSCOPY W/ POLYPECTOMY      COLONOSCOPY WITH PROPOFOL N/A 03/13/2019   Procedure: COLONOSCOPY WITH PROPOFOL;  Surgeon: Beverley Fiedler, MD;  Location[facility] ENDOSCOPY;  Service: Gastroenterology;  Laterality: N/A;   CORONARY ANGIOPLASTY WITH STENT PLACEMENT  1999; 2007   "1 + 1"   ERCP N/A 02/03/2022   Procedure: ENDOSCOPIC RETROGRADE CHOLANGIOPANCREATOGRAPHY (ERCP);  Surgeon: Jeani Hawking, MD;  Location: [facility] ENDOSCOPY;  Service: Gastroenterology;  Laterality: N/A;   ESOPHAGOGASTRODUODENOSCOPY (EGD) WITH PROPOFOL N/A 03/13/2019   Procedure: ESOPHAGOGASTRODUODENOSCOPY (EGD) WITH PROPOFOL;  Surgeon: Erick Blinks  M, MD;  [facility];  Service: Gastroenterology;  Laterality: N/A;   ESOPHAGOGASTRODUODENOSCOPY (EGD) WITH PROPOFOL N/A 05/27/2020   Procedure: ESOPHAGOGASTRODUODENOSCOPY (EGD) WITH PROPOFOL;  Surgeon: Lanelle Bal, DO;  [facility];  Service: Endoscopy;  Laterality: N/A;   ESOPHAGOGASTRODUODENOSCOPY (EGD) WITH PROPOFOL N/A 09/03/2022   Procedure: ESOPHAGOGASTRODUODENOSCOPY (EGD) WITH PROPOFOL;  Surgeon: Jeani Hawking, MD;  Location: [facility] ENDOSCOPY;  Service: Gastroenterology;  Laterality: N/A;   EYE SURGERY Bilateral    lazer   FLEXIBLE SIGMOIDOSCOPY N/A 05/23/2022   Procedure: FLEXIBLE SIGMOIDOSCOPY;  Surgeon: Jeani Hawking, MD;  Location: [facility] ENDOSCOPY;  Service: Gastroenterology;  Laterality: N/A;   FLEXIBLE SIGMOIDOSCOPY N/A 05/24/2022   Procedure: FLEXIBLE SIGMOIDOSCOPY;  Surgeon: Imogene Burn, MD;  Location: [facility] ENDOSCOPY;  Service: Gastroenterology;  Laterality: N/A;   FLEXIBLE SIGMOIDOSCOPY N/A 09/03/2022   Procedure: FLEXIBLE SIGMOIDOSCOPY;  Surgeon: Jeani Hawking, MD;  Location: [facility] ENDOSCOPY;  Service: Gastroenterology;  Laterality: N/A;   HEMOSTASIS CLIP PLACEMENT  03/13/2019   Procedure: HEMOSTASIS CLIP PLACEMENT;  Surgeon: Beverley Fiedler, MD;  Location: [facility] ENDOSCOPY;  Service: Gastroenterology;;   HEMOSTASIS CLIP PLACEMENT  05/23/2022   Procedure: HEMOSTASIS CLIP PLACEMENT;  Surgeon:  Jeani Hawking, MD;  Location: [facility] ENDOSCOPY;  Service: Gastroenterology;;   HEMOSTASIS CONTROL  05/24/2022   Procedure: HEMOSTASIS CONTROL;  Surgeon: Imogene Burn, MD;  Location: [facility] ENDOSCOPY;  Service: Gastroenterology;;   HOT HEMOSTASIS N/A 05/23/2022   Procedure: HOT HEMOSTASIS (ARGON PLASMA COAGULATION/BICAP);  Surgeon: Jeani Hawking, MD;  Location: [facility] ENDOSCOPY;  Service: Gastroenterology;  Laterality: N/A;   I & D EXTREMITY Left 05/05/2022   Procedure: IRRIGATION AND DEBRIDEMENT LEFT ARM AV FISTULA;  Surgeon: Cephus Shelling, MD;  Location: [facility] OR;  Service: Vascular;  Laterality: Left;   I & D EXTREMITY N/A 11/14/2022   Procedure: IRRIGATION AND DEBRIDEMENT OF LOWER EXTREMITY WOUND;  Surgeon: Nadara Mustard, MD;  Location[facility] OR;  Service: Orthopedics;  Laterality: N/A;   INSERTION OF DIALYSIS CATHETER Right 04/02/2022   Procedure: INSERTION OF TUNNELED DIALYSIS CATHETER;  Surgeon: Nada Libman, MD;  Location: [facility] OR;  Service: Vascular;  Laterality: Right;   KNEE ARTHROSCOPY Left 10/25/2006   POLYPECTOMY  03/13/2019   Procedure: POLYPECTOMY;  Surgeon: Beverley Fiedler, MD;  Location[facility];  Service: Gastroenterology;;   REMOVAL OF STONES  02/03/2022   Procedure: REMOVAL OF STONES;  Surgeon: Jeani Hawking, MD;  Location: [facility] ENDOSCOPY;  Service: Gastroenterology;;   REVISON OF ARTERIOVENOUS FISTULA Left 08/20/2022   Procedure: REVISON OF LEFT ARM ARTERIOVENOUS FISTULA;  Surgeon: Nada Libman, MD;  Location[facility] OR;  Service: Vascular;  Laterality: Left;   RIGHT HEART CATH N/A 07/24/2017   Procedure: RIGHT HEART CATH;  Surgeon: Dolores Patty, MD;  [facility];  Service: Cardiovascular;  Laterality: N/A;   RIGHT HEART CATHETERIZATION N/A 09/22/2013   Procedure: RIGHT HEART CATH;  Surgeon: Dolores Patty, MD;  Location: [facility] CATH LAB;  Service: Cardiovascular;  Laterality: N/A;   SHOULDER ARTHROSCOPY WITH OPEN ROTATOR CUFF REPAIR Right 03/14/2014    Procedure: RIGHT SHOULDER ARTHROSCOPY WITH BICEPS RELEASE, OPEN SUBSCAPULA REPAIR, OPEN SUPRASPINATUS REPAIR.;  Surgeon: Cammy Copa, MD;  Location: [facility] OR;  Service: Orthopedics;  Laterality: Right;   SPHINCTEROTOMY  02/03/2022   Procedure: SPHINCTEROTOMY;  Surgeon: Jeani Hawking, MD;  Location: [facility] ENDOSCOPY;  Service: Gastroenterology;;   STUMP REVISION Right 06/13/2022   Procedure: REVISION RIGHT BELOW KNEE AMPUTATION;  Surgeon: Nadara Mustard, MD;  Location:  [facility] OR;  Service: Orthopedics;  Laterality: Right;   TEE WITHOUT CARDIOVERSION N/A 02/04/2022   Procedure: TRANSESOPHAGEAL ECHOCARDIOGRAM (TEE);  Surgeon: Dolores Patty, MD;  Location: [facility] ENDOSCOPY;  Service: Cardiovascular;  Laterality: N/A;   THROMBECTOMY W/ EMBOLECTOMY Left 08/20/2022   Procedure: THROMBECTOMY OF LEFT ARM ARTERIOVENOUS FISTULA;  Surgeon: Nada Libman, MD;  Location: [facility] OR;  Service: Vascular;  Laterality: Left;   TOE AMPUTATION Right 02/24/2018   GREAT TOE AND 2ND TOE AMPUTATION   TUBAL LIGATION  1970's   Current Outpatient Medications on File Prior to Visit  Medication Sig Dispense Refill   acetaminophen (TYLENOL) 325 MG tablet Take 2 tablets (650 mg total) by mouth every 6 (six) hours as needed for mild pain (or Fever >/= 101).     albuterol (VENTOLIN HFA) 108 (90 Base) MCG/ACT inhaler Inhale 1-2 puffs into the lungs every 6 (six) hours as needed for wheezing or shortness of breath. 6.7 g 0   amiodarone (PACERONE) 200 MG tablet Take 1 tablet (200 mg total) by mouth daily. 30 tablet 0   apixaban (ELIQUIS) 5 MG TABS tablet Take 1 tablet (5 mg total) by mouth 2 (two) times daily. 60 tablet 0   ascorbic acid (VITAMIN C) 500 MG tablet Take 1 tablet (500 mg total) by mouth daily. 100 tablet 0   Calcium Carbonate Antacid (CALCIUM CARBONATE, DOSED IN MG ELEMENTAL CALCIUM,) 1250 MG/5ML SUSP Take 5 mLs (500 mg of elemental calcium total) by mouth every 6 (six) hours as needed for indigestion.     clonazePAM  (KLONOPIN) 0.5 MG disintegrating tablet Take 1 tablet (0.5 mg total) by mouth 2 (two) times daily. 60 tablet 0   folic acid (FOLVITE) 1 MG tablet Take 1 tablet (1 mg total) by mouth daily. 30 tablet 0   insulin aspart (NOVOLOG FLEXPEN) 100 UNIT/ML FlexPen Inject 0-15 Units into the skin See admin instructions. Inject 12 units subcutaneously 3 times daily with meals (0900, 1300 & 1700) & as needed via sliding scale insulin CBG <70=Notify NP/MD 71-149=0 units,150-199=4 units, 200-249=6 units,250- 299=10 units, 300-349=12 units, 350-399=15 units, > 400 call MD ( prime pen with 2 units prior  to set dose) (Patient taking differently: Inject 0-16 Units into the skin See admin instructions. Inject 12 units subcutaneously 3 times daily with meals (0900, 1300 & 1700) & as needed via sliding scale insulin CBG <70=Notify NP/MD 71-149=0 units,150-199=4 units, 200-249=6 units,250- 299=10 units, 300-349=12 units, 350-399=15 units, > 400 call MD ( prime pen with 2 units prior  to set dose)) 15 mL 0   insulin glargine (LANTUS) 100 UNIT/ML Solostar Pen Inject 12 units into the skin at bedtime (Max 40 units) 30 mL 11   insulin glargine (LANTUS) 100 UNIT/ML Solostar Pen Inject 12 Units into the skin at bedtime. 15 mL 11   levothyroxine (SYNTHROID) 75 MCG tablet Take 1 tablet (75 mcg total) by mouth daily before breakfast. 30 tablet 0   methocarbamol (ROBAXIN) 750 MG tablet Take 1 tablet (750 mg total) by mouth every 6 (six) hours as needed for muscle spasms. 60 tablet 0   metoCLOPramide (REGLAN) 5 MG tablet Take 1 tablet (5 mg total) by mouth 3 (three) times daily before meals. 90 tablet 0   midodrine (PROAMATINE) 10 MG tablet Take 1 tablet (10 mg total) by mouth Every Tuesday,Thursday,and Saturday with dialysis. 30 tablet 0   oxyCODONE (OXY IR/ROXICODONE) 5 MG immediate release tablet Take 0.5-1 tablets (2.5-5 mg total) by mouth every 4 (four) hours as  needed for moderate pain (pain score 4-6). 30 tablet 0   pantoprazole  (PROTONIX) 40 MG tablet Take 1 tablet (40 mg total) by mouth 2 (two) times daily before a meal. 60 tablet 0   polyethylene glycol (MIRALAX / GLYCOLAX) 17 g packet Take 17 g by mouth daily.     ramelteon (ROZEREM) 8 MG tablet Take 1 tablet (8 mg total) by mouth at bedtime. 30 tablet 0   senna-docusate (SENOKOT-S) 8.6-50 MG tablet Take 2 tablets by mouth at bedtime.     sevelamer carbonate (RENVELA) 800 MG tablet Take 2 tablets (1,600 mg total) by mouth 3 (three) times daily with meals. (0800, 1200 & 1700) 180 tablet 0   Zinc Sulfate 220 (50 Zn) MG TABS Take 1 tablet (220 mg total) by mouth daily. 100 tablet 0   No current facility-administered medications on file prior to visit.     Allergies  Allergen Reactions   Cephalexin Diarrhea and Other (See Comments)   Codeine Nausea And Vomiting and Other (See Comments)   Social History   Socioeconomic History   Marital status: Married    Spouse name: Ramon Dredge   Number of children: 3   Years of education: 12th   Highest education level: Not on file  Occupational History    Employer: UNEMPLOYED  Tobacco Use   Smoking status: Former    Current packs/day: 0.00    Average packs/day: 3.0 packs/day for 32.0 years (96.0 ttl pk-yrs)    Types: Cigarettes    Start date: 10/24/1965    Quit date: 10/24/1997    Years since quitting: 25.1   Smokeless tobacco: Never  Vaping Use   Vaping status: Never Used  Substance and Sexual Activity   Alcohol use: Not Currently    Comment: "might have 2-3 daiquiris in the summer"   Drug use: No   Sexual activity: Not Currently    Birth control/protection: Surgical    Comment: Hysterectomy  Other Topics Concern   Not on file  Social History Narrative   Pt lives at home with her spouse.Caffeine Use- 3 sodas daily.  Update:  now resides [facility]   Social Determinants of Health   Financial Resource Strain: Low Risk  (08/16/2021)   Overall Financial Resource Strain (CARDIA)    Difficulty of Paying Living  Expenses: Not hard at all  Food Insecurity: No Food Insecurity (11/10/2022)   Hunger Vital Sign    Worried About Running Out of Food in the Last Year: Never true    Ran Out of Food in the Last Year: Never true  Transportation Needs: No Transportation Needs (11/10/2022)   PRAPARE - Administrator, Civil Service (Medical): No    Lack of Transportation (Non-Medical): No  Physical Activity: Inactive (08/16/2021)   Exercise Vital Sign    Days of Exercise per Week: 0 days    Minutes of Exercise per Session: 0 min  Stress: No Stress Concern Present (08/16/2021)   [employer] of Occupational Health - Occupational Stress Questionnaire    Feeling of Stress : Not at all  Social Connections: Moderately Isolated (08/16/2021)   Social Connection and Isolation Panel [NHANES]    Frequency of Communication with Friends and Family: More than three times a week    Frequency of Social Gatherings with Friends and Family: More than three times a week    Attends Religious Services: Never    Database administrator or Organizations: No    Attends Banker Meetings:  Never    Marital Status: Married  Catering manager Violence: Not At Risk (11/10/2022)   Humiliation, Afraid, Rape, and Kick questionnaire    Fear of Current or Ex-Partner: No    Emotionally Abused: No    Physically Abused: No    Sexually Abused: No     Review of Systems  Gastrointestinal:  Positive for diarrhea.       Objective:   Physical Exam Constitutional:      General: She is not in acute distress.    Appearance: Normal appearance. She is obese. She is not ill-appearing or toxic-appearing.  Cardiovascular:     Rate and Rhythm: Normal rate and regular rhythm.     Heart sounds: Normal heart sounds. No murmur heard.    No friction rub. No gallop.  Pulmonary:     Effort: Pulmonary effort is normal. No respiratory distress.     Breath sounds: Normal breath sounds. No stridor. No wheezing, rhonchi or rales.   Abdominal:     General: Bowel sounds are normal.     Palpations: Abdomen is soft.     Tenderness: There is no abdominal tenderness.  Musculoskeletal:     Left lower leg: No edema.  Skin:    General: Skin is warm.  Neurological:     Mental Status: She is alert.   Patient has an ulcer on her left lateral lower leg just below the knee.  This is covered with special gauze, then wrapped with an Ace bandage.  I removed the Ace bandage and evaluated the wound.  There is no necrotic debris.  There is no evidence of cellulitis.  There is no drainage.  The wound appears warm and well-vascularized with healthy granulation tissue.  Patient has a right lower limb amputation    Assessment & Plan:  Insulin dependent type 2 diabetes mellitus (HCC)  ESRF (end stage renal failure) (HCC)  Paroxysmal atrial fibrillation (HCC)  Venous ulcer of left leg (HCC) Spent more than 30 minutes today with the patient reviewing her medication list.  First I explained to the patient that she should be on Lantus 12 units daily.  I recommended that they not randomly change the dose based on isolated blood sugars because of her dialysis dependent renal failure.  Instead they need to be consistent using sliding scale.  The sliding scale has been printed out for the patient's husband.  Second I explained that they want the patient on venlafaxine for depression.  Therefore they are to stop the Trintellix and resume vaccine and I gave the patient a prescription for this.  I will defer wound care to Dr. Lajoyce Corners.  However there is no evidence of any necrotic debris or secondary cellulitis.

## 2022-12-29 ENCOUNTER — Other Ambulatory Visit: Payer: Self-pay | Admitting: Family Medicine

## 2022-12-29 ENCOUNTER — Ambulatory Visit (INDEPENDENT_AMBULATORY_CARE_PROVIDER_SITE_OTHER): Payer: PPO | Admitting: Orthopedic Surgery

## 2022-12-29 DIAGNOSIS — Z89511 Acquired absence of right leg below knee: Secondary | ICD-10-CM

## 2022-12-29 DIAGNOSIS — I890 Lymphedema, not elsewhere classified: Secondary | ICD-10-CM

## 2022-12-29 DIAGNOSIS — L98491 Non-pressure chronic ulcer of skin of other sites limited to breakdown of skin: Secondary | ICD-10-CM

## 2022-12-29 DIAGNOSIS — I87332 Chronic venous hypertension (idiopathic) with ulcer and inflammation of left lower extremity: Secondary | ICD-10-CM

## 2022-12-29 DIAGNOSIS — F339 Major depressive disorder, recurrent, unspecified: Secondary | ICD-10-CM

## 2022-12-29 MED ORDER — CLONAZEPAM 0.5 MG PO TBDP
0.5000 mg | ORAL_TABLET | Freq: Two times a day (BID) | ORAL | 1 refills | Status: DC
Start: 2022-12-29 — End: 2023-01-19

## 2022-12-30 ENCOUNTER — Encounter: Payer: Self-pay | Admitting: Orthopedic Surgery

## 2022-12-30 DIAGNOSIS — D631 Anemia in chronic kidney disease: Secondary | ICD-10-CM | POA: Diagnosis not present

## 2022-12-30 DIAGNOSIS — D689 Coagulation defect, unspecified: Secondary | ICD-10-CM | POA: Diagnosis not present

## 2022-12-30 DIAGNOSIS — N2581 Secondary hyperparathyroidism of renal origin: Secondary | ICD-10-CM | POA: Diagnosis not present

## 2022-12-30 DIAGNOSIS — Z992 Dependence on renal dialysis: Secondary | ICD-10-CM | POA: Diagnosis not present

## 2022-12-30 DIAGNOSIS — N186 End stage renal disease: Secondary | ICD-10-CM | POA: Diagnosis not present

## 2022-12-30 NOTE — Progress Notes (Signed)
Office Visit Note   Patient: Susan [name]           Date of Birth: 1950-07-21           MRN: [id number] Visit Date: 12/29/2022              Requested by: Donita Brooks, MD [address] [place],  [place] [address] PCP: Donita Brooks, MD  Chief Complaint  Patient presents with   Left Leg - Follow-up      HPI: Patient is a 72 year old woman with chronic venous insufficiency ulcer lateral left leg.  Patient had Kerecis micro applied last office visit on July 29.  Assessment & Plan: Visit Diagnoses:  1. Ischemic ulcer, limited to breakdown of skin (HCC)   2. 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 Lymphedema   4. Right below-knee amputee (HCC)     Plan: 4 cm of Kerecis was applied to the wound plus a compression dressing.  Follow-Up Instructions: Return in about 1 week (around 01/05/2023).   Ortho Exam  Patient is alert, oriented, no adenopathy, well-dressed, normal affect, normal respiratory effort. Examination the wound bed has healthy granulation tissue.  After debridement the wound measures 3 x 5 cm.  The wound healing has stalled, the wound bed has healthy granulation tissue, and patient presents for evaluation and application of Kerecis MariGen Micro graft. After informed consent a 10 blade knife was used to debride the skin and soft tissue to healthy viable bleeding granulation tissue.  Silver nitrate was used for hemostasis. The wound measures: 5 cm in length, 3cm  in width, 1 mm in depth, wound location lateral left calf Kerecis MariGen micro tissue graft 4 cm2 was applied, and there was no wastage.  Please see the photo below of the Lot number and expiration date. The micro tissue graft was covered with a nonadherent Adaptic dressing, bolstered with 4 x 4 gauze and secured with a compression wrap.     Imaging: No results found.      Labs: Lab Results  Component Value Date   HGBA1C 7.9 (H)  08/27/2022   HGBA1C 8.9 (H) 05/02/2022   HGBA1C >15.5 (H) 01/20/2022   ESRSEDRATE 17 11/10/2022   CRP 3.6 (H) 11/10/2022   CRP 0.6 09/01/2022   CRP 0.7 08/22/2020   LABURIC 4.7 08/09/2020   LABURIC 9.1 (H) 10/12/2018   LABURIC 9.0 (H) 03/06/2018   REPTSTATUS 11/19/2022 FINAL 11/14/2022   GRAMSTAIN  11/14/2022    MODERATE WBC PRESENT,BOTH PMN AND MONONUCLEAR RARE SQUAMOUS EPITHELIAL CELLS PRESENT NO ORGANISMS SEEN Performed at [name], [address], [place], [place] [address]    CULT  11/14/2022    FEW PROTEUS MIRABILIS RARE KLEBSIELLA PNEUMONIAE NO ANAEROBES ISOLATED FEW ENTEROCOCCUS FAECALIS    LABORGA PROTEUS MIRABILIS 11/14/2022   LABORGA KLEBSIELLA PNEUMONIAE 11/14/2022   LABORGA ENTEROCOCCUS FAECALIS 11/14/2022     Lab Results  Component Value Date   ALBUMIN 3.2 (L) 12/11/2022   ALBUMIN 2.9 (L) 12/06/2022   ALBUMIN 3.1 (L) 12/02/2022   PREALBUMIN 12 (L) 11/10/2022   PREALBUMIN 13 (L) 05/02/2022    Lab Results  Component Value Date   MG 1.9 11/20/2022   MG 1.9 09/04/2022   MG 1.8 09/03/2022   Lab Results  Component Value Date   VD25OH 42.14 05/02/2022    Lab Results  Component Value Date   PREALBUMIN 12 (L) 11/10/2022   PREALBUMIN 13 (  L) 05/02/2022      Latest Ref Rng & Units 12/11/2022   11:29 AM 12/06/2022    3:38 PM 12/02/2022    1:58 PM  CBC EXTENDED  WBC 4.0 - 10.5 K/uL 5.9  6.8  7.0   RBC 3.87 - 5.11 MIL/uL 5.02  4.38  3.99   Hemoglobin 12.0 - 15.0 g/dL 16.1  09.6  9.7   HCT 04.5 - 46.0 % 39.2  34.7  31.2   Platelets 150 - 400 K/uL 255  275  250      There is no height or weight on file to calculate BMI.  Orders:  No orders of the defined types were placed in this encounter.  No orders of the defined types were placed in this encounter.    Procedures: No procedures performed  Clinical Data: No additional findings.  ROS:  All other systems negative, except as noted in the HPI. Review of Systems  Objective: Vital  Signs: There were no vitals taken for this visit.  Specialty Comments:  No specialty comments available.  PMFS History: Patient Active Problem List   Diagnosis Date Noted   Acute on chronic anemia 11/25/2022   Right below-knee amputee (HCC) 11/20/2022   Cellulitis of left lower extremity 11/14/2022   Maggot infestation 11/12/2022   Complicated wound infection 11/10/2022   Secondary hypercoagulable state (HCC) 09/04/2022   Right sided abdominal pain 08/31/2022   Constipation 06/07/2022   History of Clostridioides difficile colitis 06/06/2022   Below-knee amputation of right lower extremity (HCC) 06/06/2022   Diverticulitis 06/05/2022   Stercoral colitis 06/05/2022   C. 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) 04/07/2022   Allergy, unspecified, initial encounter 04/07/2022   Dependence on renal dialysis (HCC) 04/07/2022   Gout due to renal impairment, unspecified site 04/07/2022   Hypertensive heart and chronic kidney disease with heart failure and with stage 5 chronic kidney disease, or end stage renal disease (HCC) 04/07/2022   Personal history of transient ischemic attack (TIA), and cerebral infarction without residual deficits 04/07/2022   Renal osteodystrophy 04/07/2022   Venous stasis ulcer of right calf (HCC) 03/31/2022   Fistula, colovaginal 03/26/2022   Diarrhea 03/26/2022   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   Sepsis without  acute organ dysfunction (HCC)    Bacteremia    Acute pancreatitis 02/01/2022   Abdominal pain 02/01/2022   SIRS (systemic inflammatory response syndrome) (HCC) 02/01/2022   Transaminitis 02/01/2022   History of anemia due to chronic kidney disease 02/01/2022   Paroxysmal atrial fibrillation (HCC) 02/01/2022   DKA (diabetic ketoacidosis) (HCC) 01/14/2022   NSTEMI (non-ST elevated myocardial infarction) (HCC) 03/05/2021   Acute renal failure superimposed on stage 4 chronic kidney disease (HCC) 08/22/2020   Hypoalbuminemia 05/25/2020   GERD (gastroesophageal reflux disease) 05/25/2020   Pressure injury of skin 05/17/2020   Acute on chronic combined systolic and diastolic congestive heart failure (HCC) 03/07/2020   Type 2 diabetes mellitus with diabetic polyneuropathy, with long-term current use of insulin (HCC) 03/07/2020  Obesity, Class III, BMI 40-49.9 (morbid obesity) (HCC) 03/07/2020   Common bile duct (CBD) obstruction 05/28/2019   Benign neoplasm of ascending colon    Benign neoplasm of transverse colon    Benign neoplasm of descending colon    Benign neoplasm of sigmoid colon    Gastric polyps    Hyperkalemia 03/11/2019   Prolonged QT interval 03/11/2019   Acute blood loss anemia 03/11/2019   Onychomycosis 06/21/2018   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)    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 (HCC)    PVD (peripheral vascular disease) (HCC) 10/26/2017   E-coli UTI 07/27/2017   Hypothyroidism 07/27/2017   AKI (acute kidney injury) (HCC)    PAH (pulmonary artery hypertension) (HCC)    Impaired ambulation 07/19/2017   Nausea & vomiting 07/15/2017   Leg cramps 02/27/2017   Peripheral edema 01/12/2017   Diabetic neuropathy (HCC) 11/12/2016   CKD (chronic kidney disease), stage IV (HCC) 10/24/2015   Anemia of chronic disease 10/03/2015   Generalized anxiety disorder 10/03/2015   Insomnia 10/03/2015   Hyperglycemia due to diabetes mellitus  (HCC) 06/07/2015   Chronic diastolic CHF (congestive heart failure) (HCC) 06/07/2015   Non compliance with medical treatment 04/17/2014   Rotator cuff tear 03/14/2014   Class 3 obesity (HCC) 09/23/2013   Chronic HFrEF (heart failure with reduced ejection fraction) (HCC) 06/03/2013   Hypotension 12/25/2012   Urinary incontinence    MDD (major depressive disorder) 11/12/2010   RBBB (right bundle branch block)    Wide-complex tachycardia    Coronary artery disease    Hyperlipemia 01/22/2009   Essential hypertension 01/22/2009   Past Medical History:  Diagnosis Date   Anemia    hx   Anxiety    Arthritis    "generalized" (03/15/2014)   CAD (coronary artery disease)    MI in 2000 - MI  2007 - treated bare metal stent (no nuclear since then as 9/11)   Carotid artery disease (HCC)    Chronic diastolic heart failure (HCC)    a) ECHO (08/2013) EF 55-60% and RV function nl b) RHC (08/2013) RA 4, RV 30/5/7, PA 25/10 (16), PCWP 7, Fick CO/CI 6.3/2.7, PVR 1.5 WU, PA 61 and 66%   Daily headache    "~ every other day; since I fell in June" (03/15/2014)   Depression    Diabetic retinopathy (HCC)    Dyslipidemia    ESRD (end stage renal disease) (HCC)    Dialysis on Tues Thurs Sat   Exertional shortness of breath    History of kidney stones    HTN (hypertension)    Hypothyroidism    Obesity    Osteoarthritis    PAF (paroxysmal atrial fibrillation) (HCC)    Peripheral neuropathy    bilateral feet/hands   PONV (postoperative nausea and vomiting)    RBBB (right bundle branch block)    Old   Stroke (HCC)    mini strokes   Type II diabetes mellitus (HCC)    Type II, [name]. patient has omnipod insulin pump with Novolin R Insulin    Family History  Problem Relation Age of Onset   Heart attack Mother 93    Past Surgical History:  Procedure Laterality Date   A/V FISTULAGRAM Left 11/07/2022   Procedure: A/V Fistulagram;  Surgeon: Leonie Douglas, MD;  Location: [facility]  INV[facility]IVE [facility] LAB;  Service: Cardiovascular;  Laterality: Left;   ABDOMINAL HYSTERECTOMY  1980's   AMPUTATION  Right 02/24/2018   Procedure: RIGHT FOOT GREAT TOE AND 2ND TOE AMPUTATION;  Surgeon: Nadara Mustard, MD;  Location: [facility] OR;  Service: Orthopedics;  Laterality: Right;   AMPUTATION Right 04/30/2018   Procedure: RIGHT TRANSMETATARSAL AMPUTATION;  Surgeon: Nadara Mustard, MD;  Location: [facility] OR;  Service: Orthopedics;  Laterality: Right;   AMPUTATION Right 05/02/2022   Procedure: RIGHT BELOW KNEE AMPUTATION;  Surgeon: Nadara Mustard, MD;  Location: [facility] OR;  Service: Orthopedics;  Laterality: Right;   APPLICATION OF WOUND VAC Right 06/13/2022   Procedure: APPLICATION OF WOUND VAC;  Surgeon: Nadara Mustard, MD;  Location: [facility] OR;  Service: Orthopedics;  Laterality: Right;   APPLICATION OF WOUND VAC Left 11/14/2022   Procedure: APPLICATION OF WOUND VAC;  Surgeon: Nadara Mustard, MD;  Location: [facility] OR;  Service: Orthopedics;  Laterality: Left;   AV FISTULA PLACEMENT Left 04/02/2022   Procedure: LEFT ARM ARTERIOVENOUS (AV) FISTULA CREATION;  Surgeon: Nada Libman, MD;  Location: [facility] OR;  Service: Vascular;  Laterality: Left;  PERIPHERAL NERVE BLOCK   BASCILIC VEIN TRANSPOSITION Left 07/31/2022   Procedure: LEFT ARM SECOND STAGE BASILIC VEIN TRANSPOSITION;  Surgeon: Nada Libman, MD;  Location: [facility] OR;  Service: Vascular;  Laterality: Left;   BIOPSY  05/27/2020   Procedure: BIOPSY;  Surgeon: Lanelle Bal, DO;  Location: [facility];  Service: Endoscopy;;   CATARACT EXTRACTION, BILATERAL Bilateral ?2013   COLONOSCOPY W/ POLYPECTOMY     COLONOSCOPY WITH PROPOFOL N/A 03/13/2019   Procedure: COLONOSCOPY WITH PROPOFOL;  Surgeon: Beverley Fiedler, MD;  Location: [facility] ENDOSCOPY;  Service: Gastroenterology;  Laterality: N/A;   CORONARY ANGIOPLASTY WITH STENT PLACEMENT  1999; 2007   "1 + 1"   ERCP N/A 02/03/2022   Procedure: ENDOSCOPIC RETROGRADE CHOLANGIOPANCREATOGRAPHY (ERCP);  Surgeon: Jeani Hawking, MD;   Location: [facility] ENDOSCOPY;  Service: Gastroenterology;  Laterality: N/A;   ESOPHAGOGASTRODUODENOSCOPY (EGD) WITH PROPOFOL N/A 03/13/2019   Procedure: ESOPHAGOGASTRODUODENOSCOPY (EGD) WITH PROPOFOL;  Surgeon: Beverley Fiedler, MD;  Location: [facility] ENDOSCOPY;  Service: Gastroenterology;  Laterality: N/A;   ESOPHAGOGASTRODUODENOSCOPY (EGD) WITH PROPOFOL N/A 05/27/2020   Procedure: ESOPHAGOGASTRODUODENOSCOPY (EGD) WITH PROPOFOL;  Surgeon: Lanelle Bal, DO;  [facility];  Service: Endoscopy;  Laterality: N/A;   ESOPHAGOGASTRODUODENOSCOPY (EGD) WITH PROPOFOL N/A 09/03/2022   Procedure: ESOPHAGOGASTRODUODENOSCOPY (EGD) WITH PROPOFOL;  Surgeon: Jeani Hawking, MD;  Location: [facility] ENDOSCOPY;  Service: Gastroenterology;  Laterality: N/A;   EYE SURGERY Bilateral    lazer   FLEXIBLE SIGMOIDOSCOPY N/A 05/23/2022   Procedure: FLEXIBLE SIGMOIDOSCOPY;  Surgeon: Jeani Hawking, MD;  Location: [facility] ENDOSCOPY;  Service: Gastroenterology;  Laterality: N/A;   FLEXIBLE SIGMOIDOSCOPY N/A 05/24/2022   Procedure: FLEXIBLE SIGMOIDOSCOPY;  Surgeon: Imogene Burn, MD;  Location: [facility] ENDOSCOPY;  Service: Gastroenterology;  Laterality: N/A;   FLEXIBLE SIGMOIDOSCOPY N/A 09/03/2022   Procedure: FLEXIBLE SIGMOIDOSCOPY;  Surgeon: Jeani Hawking, MD;  Location: [facility] ENDOSCOPY;  Service: Gastroenterology;  Laterality: N/A;   HEMOSTASIS CLIP PLACEMENT  03/13/2019   Procedure: HEMOSTASIS CLIP PLACEMENT;  Surgeon: Beverley Fiedler, MD;  Location: [facility] ENDOSCOPY;  Service: Gastroenterology;;   HEMOSTASIS CLIP PLACEMENT  05/23/2022   Procedure: HEMOSTASIS CLIP PLACEMENT;  Surgeon: Jeani Hawking, MD;  Location: [facility] ENDOSCOPY;  Service: Gastroenterology;;   HEMOSTASIS CONTROL  05/24/2022   Procedure: HEMOSTASIS CONTROL;  Surgeon: Imogene Burn, MD;  Location: [facility] ENDOSCOPY;  Service: Gastroenterology;;   HOT HEMOSTASIS N/A 05/23/2022   Procedure: HOT HEMOSTASIS (ARGON PLASMA COAGULATION/BICAP);  Surgeon: Jeani Hawking, MD;  Location[facility] ENDOSCOPY;   Service: Gastroenterology;  Laterality: N/A;   I & D EXTREMITY Left 05/05/2022   Procedure: IRRIGATION AND DEBRIDEMENT LEFT ARM AV FISTULA;  Surgeon: Cephus Shelling, MD;  Location: [facility] OR;  Service: Vascular;  Laterality: Left;   I & D EXTREMITY N/A 11/14/2022   Procedure: IRRIGATION AND DEBRIDEMENT OF LOWER EXTREMITY WOUND;  Surgeon: Nadara Mustard, MD;  Location: [facility] OR;  Service: Orthopedics;  Laterality: N/A;   INSERTION OF DIALYSIS CATHETER Right 04/02/2022   Procedure: INSERTION OF TUNNELED DIALYSIS CATHETER;  Surgeon: Nada Libman, MD;  Location: [facility] OR;  Service: Vascular;  Laterality: Right;   KNEE ARTHROSCOPY Left 10/25/2006   POLYPECTOMY  03/13/2019   Procedure: POLYPECTOMY;  Surgeon: Beverley Fiedler, MD;  Location: [facility] ENDOSCOPY;  Service: Gastroenterology;;   REMOVAL OF STONES  02/03/2022   Procedure: REMOVAL OF STONES;  Surgeon: Jeani Hawking, MD;  Location: [facility] ENDOSCOPY;  Service: Gastroenterology;;   REVISON OF ARTERIOVENOUS FISTULA Left 08/20/2022   Procedure: REVISON OF LEFT ARM ARTERIOVENOUS FISTULA;  Surgeon: Nada Libman, MD;  Location: [facility] OR;  Service: Vascular;  Laterality: Left;   RIGHT HEART CATH N/A 07/24/2017   Procedure: RIGHT HEART CATH;  Surgeon: Dolores Patty, MD;  Location: [facility] LAB;  Service: Cardiovascular;  Laterality: N/A;   RIGHT HEART CATHETERIZATION N/A 09/22/2013   Procedure: RIGHT HEART CATH;  Surgeon: Dolores Patty, MD;  Location: [facility] CATH LAB;  Service: Cardiovascular;  Laterality: N/A;   SHOULDER ARTHROSCOPY WITH OPEN ROTATOR CUFF REPAIR Right 03/14/2014   Procedure: RIGHT SHOULDER ARTHROSCOPY WITH BICEPS RELEASE, OPEN SUBSCAPULA REPAIR, OPEN SUPRASPINATUS REPAIR.;  Surgeon: Cammy Copa, MD;  Location: [facility] OR;  Service: Orthopedics;  Laterality: Right;   SPHINCTEROTOMY  02/03/2022   Procedure: SPHINCTEROTOMY;  Surgeon: Jeani Hawking, MD;  Location: [facility] ENDOSCOPY;  Service: Gastroenterology;;   STUMP REVISION Right 06/13/2022    Procedure: REVISION RIGHT BELOW KNEE AMPUTATION;  Surgeon: Nadara Mustard, MD;  Location: [facility] OR;  Service: Orthopedics;  Laterality: Right;   TEE WITHOUT CARDIOVERSION N/A 02/04/2022   Procedure: TRANSESOPHAGEAL ECHOCARDIOGRAM (TEE);  Surgeon: Dolores Patty, MD;  Location: [facility] ENDOSCOPY;  Service: Cardiovascular;  Laterality: N/A;   THROMBECTOMY W/ EMBOLECTOMY Left 08/20/2022   Procedure: THROMBECTOMY OF LEFT ARM ARTERIOVENOUS FISTULA;  Surgeon: Nada Libman, MD;  Location: [facility] OR;  Service: Vascular;  Laterality: Left;   TOE AMPUTATION Right 02/24/2018   GREAT TOE AND 2ND TOE AMPUTATION   TUBAL LIGATION  1970's   Social History   Occupational History    Employer: UNEMPLOYED  Tobacco Use   Smoking status: Former    Current packs/day: 0.00    Average packs/day: 3.0 packs/day for 32.0 years (96.0 ttl pk-yrs)    Types: Cigarettes    Start date: 10/24/1965    Quit date: 10/24/1997    Years since quitting: 25.2   Smokeless tobacco: Never  Vaping Use   Vaping status: Never Used  Substance and Sexual Activity   Alcohol use: Not Currently    Comment: "might have 2-3 daiquiris in the summer"   Drug use: No   Sexual activity: Not Currently    Birth control/protection: Surgical    Comment: Hysterectomy

## 2022-12-31 DIAGNOSIS — L89152 Pressure ulcer of sacral region, stage 2: Secondary | ICD-10-CM | POA: Diagnosis not present

## 2022-12-31 DIAGNOSIS — S81812A Laceration without foreign body, left lower leg, initial encounter: Secondary | ICD-10-CM | POA: Diagnosis not present

## 2023-01-01 ENCOUNTER — Ambulatory Visit (HOSPITAL_COMMUNITY): Payer: PPO

## 2023-01-01 ENCOUNTER — Ambulatory Visit: Payer: PPO

## 2023-01-05 ENCOUNTER — Ambulatory Visit (INDEPENDENT_AMBULATORY_CARE_PROVIDER_SITE_OTHER): Payer: PPO | Admitting: Orthopedic Surgery

## 2023-01-05 ENCOUNTER — Telehealth: Payer: Self-pay

## 2023-01-05 ENCOUNTER — Other Ambulatory Visit: Payer: Self-pay | Admitting: Family Medicine

## 2023-01-05 ENCOUNTER — Encounter: Payer: Self-pay | Admitting: Orthopedic Surgery

## 2023-01-05 DIAGNOSIS — I87332 Chronic venous hypertension (idiopathic) with ulcer and inflammation of left lower extremity: Secondary | ICD-10-CM

## 2023-01-05 MED ORDER — VENLAFAXINE HCL ER 75 MG PO CP24: 75.0000 mg | ORAL_CAPSULE | Freq: Every day | ORAL | 3 refills | Status: DC

## 2023-01-05 NOTE — Telephone Encounter (Signed)
Pt called in to request a refill of this med venlafaxine XR (EFFEXOR-XR) 75 MG 24 hr capsule [[id number]]. Pt states that this was a med that was prescribed for upon d/c from hospital. Pt states that she no longer has any refills on this med and would like to know if pcp would refill this? Please advise.  LOV: 12/26/22  PHARMACY: [id number] [place], [place] - [address],  CB#: [phone]

## 2023-01-05 NOTE — Progress Notes (Signed)
Office Visit Note   Patient: Susan [name]           Date of Birth: Oct 13, 1950           MRN: [id number] Visit Date: 01/05/2023              Requested by: Donita Brooks, MD [address] [place],  [place] [address] PCP: Donita Brooks, MD  Chief Complaint  Patient presents with   Left Leg - Routine Post Op    11/14/2022 LLE debridement       HPI: Patient is a 72 year old woman with venous ulceration lateral left lower extremity.  4 cm of Kerecis was applied on August 5.  Assessment & Plan: Visit Diagnoses:  1. 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  Recommended protein supplement twice a day.  Plan for repeat tissue graft at follow-up.    Follow-Up Instructions: Return in about 1 week (around 01/12/2023).   Ortho Exam  Patient is alert, oriented, no adenopathy, well-dressed, normal affect, normal respiratory effort. Examination there is healthy granulation tissue on the venous ulcer left leg it measures 2.5 x 4.5 cm with healthy granulation tissue.  Imaging: No results found.    Labs: Lab Results  Component Value Date   HGBA1C 7.9 (H) 08/27/2022   HGBA1C 8.9 (H) 05/02/2022   HGBA1C >15.5 (H) 01/20/2022   ESRSEDRATE 17 11/10/2022   CRP 3.6 (H) 11/10/2022   CRP 0.6 09/01/2022   CRP 0.7 08/22/2020   LABURIC 4.7 08/09/2020   LABURIC 9.1 (H) 10/12/2018   LABURIC 9.0 (H) 03/06/2018   REPTSTATUS 11/19/2022 FINAL 11/14/2022   GRAMSTAIN  11/14/2022    MODERATE WBC PRESENT,BOTH PMN AND MONONUCLEAR RARE SQUAMOUS EPITHELIAL CELLS PRESENT NO ORGANISMS SEEN Performed at  [facility], [address], [place], [place] [address]    CULT  11/14/2022    FEW PROTEUS MIRABILIS RARE KLEBSIELLA PNEUMONIAE NO ANAEROBES ISOLATED FEW ENTEROCOCCUS FAECALIS    LABORGA PROTEUS MIRABILIS 11/14/2022   LABORGA KLEBSIELLA PNEUMONIAE 11/14/2022    LABORGA ENTEROCOCCUS FAECALIS 11/14/2022     Lab Results  Component Value Date   ALBUMIN 3.2 (L) 12/11/2022   ALBUMIN 2.9 (L) 12/06/2022   ALBUMIN 3.1 (L) 12/02/2022   PREALBUMIN 12 (L) 11/10/2022   PREALBUMIN 13 (L) 05/02/2022    Lab Results  Component Value Date   MG 1.9 11/20/2022   MG 1.9 09/04/2022   MG 1.8 09/03/2022   Lab Results  Component Value Date   VD25OH 42.14 05/02/2022    Lab Results  Component Value Date   PREALBUMIN 12 (L) 11/10/2022   PREALBUMIN 13 (L) 05/02/2022      Latest Ref Rng & Units 12/11/2022   11:29 AM 12/06/2022    3:38 PM 12/02/2022    1:58 PM  CBC EXTENDED  WBC 4.0 - 10.5 K/uL 5.9  6.8  7.0   RBC 3.87 - 5.11 MIL/uL 5.02  4.38  3.99   Hemoglobin 12.0 - 15.0 g/dL 10.2  72.5  9.7   HCT 36.6 - 46.0 % 39.2  34.7  31.2   Platelets 150 - 400 K/uL 255  275  250      There is no height or weight on file to calculate BMI.  Orders:  No orders of the defined types were placed in this encounter.  No orders  of the defined types were placed in this encounter.    Procedures: No procedures performed  Clinical Data: No additional findings.  ROS:  All other systems negative, except as noted in the HPI. Review of Systems  Objective: Vital Signs: There were no vitals taken for this visit.  Specialty Comments:  No specialty comments available.  PMFS History: Patient Active Problem List   Diagnosis Date Noted   Acute on chronic anemia 11/25/2022   Right below-knee amputee (HCC) 11/20/2022   Cellulitis of left lower extremity 11/14/2022   Maggot infestation 11/12/2022   Complicated wound infection 11/10/2022   Secondary hypercoagulable state (HCC) 09/04/2022   Right sided abdominal pain 08/31/2022   Constipation 06/07/2022   History of Clostridioides difficile colitis 06/06/2022   Below-knee amputation of right lower extremity (HCC) 06/06/2022   Diverticulitis 06/05/2022   Stercoral colitis 06/05/2022   C. 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) 04/07/2022   Allergy, unspecified, initial encounter 04/07/2022   Dependence on renal dialysis (HCC) 04/07/2022   Gout due to renal impairment, unspecified site 04/07/2022   Hypertensive heart and chronic kidney disease with heart failure and with stage 5 chronic kidney disease, or end stage renal disease (HCC) 04/07/2022   Personal history of transient ischemic attack (TIA), and cerebral infarction without residual deficits 04/07/2022   Renal osteodystrophy 04/07/2022   Venous stasis ulcer of right calf (HCC) 03/31/2022   Fistula, colovaginal 03/26/2022   Diarrhea 03/26/2022   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   Sepsis without acute organ dysfunction (HCC)    Bacteremia    Acute pancreatitis 02/01/2022   Abdominal pain 02/01/2022   SIRS (systemic inflammatory response syndrome) (HCC) 02/01/2022   Transaminitis 02/01/2022   History of anemia due to chronic kidney disease 02/01/2022   Paroxysmal atrial fibrillation (HCC) 02/01/2022   DKA (diabetic ketoacidosis) (HCC) 01/14/2022   NSTEMI (non-ST elevated myocardial infarction) (HCC) 03/05/2021   Acute renal failure superimposed on stage 4 chronic kidney disease (HCC) 08/22/2020   Hypoalbuminemia 05/25/2020   GERD (gastroesophageal reflux disease) 05/25/2020   Pressure injury of skin 05/17/2020   Acute on chronic combined systolic and diastolic congestive heart  failure (HCC) 03/07/2020   Type 2 diabetes mellitus with diabetic polyneuropathy, with long-term current use of insulin (HCC) 03/07/2020   Obesity, Class III, BMI 40-49.9 (morbid obesity) (HCC) 03/07/2020   Common bile duct (CBD) obstruction 05/28/2019   Benign neoplasm of ascending colon    Benign neoplasm of transverse colon    Benign neoplasm of descending colon    Benign neoplasm of sigmoid colon    Gastric polyps    Hyperkalemia 03/11/2019   Prolonged QT interval 03/11/2019   Acute blood loss anemia 03/11/2019   Onychomycosis 06/21/2018   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)    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 (HCC)    PVD (peripheral vascular disease) (HCC) 10/26/2017   E-coli UTI 07/27/2017   Hypothyroidism 07/27/2017   AKI (acute kidney injury) (  HCC)    PAH (pulmonary artery hypertension) (HCC)    Impaired ambulation 07/19/2017   Nausea & vomiting 07/15/2017   Leg cramps 02/27/2017   Peripheral edema 01/12/2017   Diabetic neuropathy (HCC) 11/12/2016   CKD (chronic kidney disease), stage IV (HCC) 10/24/2015   Anemia of chronic disease 10/03/2015   Generalized anxiety disorder 10/03/2015   Insomnia 10/03/2015   Hyperglycemia due to diabetes mellitus (HCC) 06/07/2015   Chronic diastolic CHF (congestive heart failure) (HCC) 06/07/2015   Non compliance with medical treatment 04/17/2014   Rotator cuff tear 03/14/2014   Class 3 obesity (HCC) 09/23/2013   Chronic HFrEF (heart failure with reduced ejection fraction) (HCC) 06/03/2013   Hypotension 12/25/2012   Urinary incontinence    MDD (major depressive disorder) 11/12/2010   RBBB (right bundle branch block)    Wide-complex tachycardia    Coronary artery disease    Hyperlipemia 01/22/2009   Essential hypertension 01/22/2009   Past Medical History:  Diagnosis Date   Anemia    hx   Anxiety    Arthritis    "generalized" (03/15/2014)   CAD (coronary artery disease)    MI in 2000 -  MI  2007 - treated bare metal stent (no nuclear since then as 9/11)   Carotid artery disease (HCC)    Chronic diastolic heart failure (HCC)    a) ECHO (08/2013) EF 55-60% and RV function nl b) RHC (08/2013) RA 4, RV 30/5/7, PA 25/10 (16), PCWP 7, Fick CO/CI 6.3/2.7, PVR 1.5 WU, PA 61 and 66%   Daily headache    "~ every other day; since I fell in June" (03/15/2014)   Depression    Diabetic retinopathy (HCC)    Dyslipidemia    ESRD (end stage renal disease) (HCC)    Dialysis on Tues Thurs Sat   Exertional shortness of breath    History of kidney stones    HTN (hypertension)    Hypothyroidism    Obesity    Osteoarthritis    PAF (paroxysmal atrial fibrillation) (HCC)    Peripheral neuropathy    bilateral feet/hands   PONV (postoperative nausea and vomiting)    RBBB (right bundle branch block)    Old   Stroke (HCC)    mini strokes   Type II diabetes mellitus (HCC)    Type II, [name]. patient has omnipod insulin pump with Novolin R Insulin    Family History  Problem Relation Age of Onset   Heart attack Mother 58    Past Surgical History:  Procedure Laterality Date   A/V FISTULAGRAM Left 11/07/2022   Procedure: A/V Fistulagram;  Surgeon: Leonie Douglas, MD;  Location: [facility];  Service: Cardiovascular;  Laterality: Left;   ABDOMINAL HYSTERECTOMY  1980's   AMPUTATION Right 02/24/2018   Procedure: RIGHT FOOT GREAT TOE AND 2ND TOE AMPUTATION;  Surgeon: Nadara Mustard, MD;  Location: [facility] OR;  Service: Orthopedics;  Laterality: Right;   AMPUTATION Right 04/30/2018   Procedure: RIGHT TRANSMETATARSAL AMPUTATION;  Surgeon: Nadara Mustard, MD;  Location: [facility] OR;  Service: Orthopedics;  Laterality: Right;   AMPUTATION Right 05/02/2022   Procedure: RIGHT BELOW KNEE AMPUTATION;  Surgeon: Nadara Mustard, MD;  Location: [facility] OR;  Service: Orthopedics;  Laterality: Right;   APPLICATION OF WOUND VAC Right 06/13/2022   Procedure: APPLICATION OF WOUND VAC;  Surgeon: Nadara Mustard, MD;  Location: [facility] OR;  Service: Orthopedics;  Laterality: Right;   APPLICATION OF WOUND VAC Left  11/14/2022   Procedure: APPLICATION OF WOUND VAC;  Surgeon: Nadara Mustard, MD;  Location: [facility] OR;  Service: Orthopedics;  Laterality: Left;   AV FISTULA PLACEMENT Left 04/02/2022   Procedure: LEFT ARM ARTERIOVENOUS (AV) FISTULA CREATION;  Surgeon: Nada Libman, MD;  Location: [facility] OR;  Service: Vascular;  Laterality: Left;  PERIPHERAL NERVE BLOCK   BASCILIC VEIN TRANSPOSITION Left 07/31/2022   Procedure: LEFT ARM SECOND STAGE BASILIC VEIN TRANSPOSITION;  Surgeon: Nada Libman, MD;  Location: [facility] OR;  Service: Vascular;  Laterality: Left;   BIOPSY  05/27/2020   Procedure: BIOPSY;  Surgeon: Lanelle Bal, DO;  Location: [facility];  Service: Endoscopy;;   CATARACT EXTRACTION, BILATERAL Bilateral ?2013   COLONOSCOPY W/ POLYPECTOMY     COLONOSCOPY WITH PROPOFOL N/A 03/13/2019   Procedure: COLONOSCOPY WITH PROPOFOL;  Surgeon: Beverley Fiedler, MD;  Location: [facility] ENDOSCOPY;  Service: Gastroenterology;  Laterality: N/A;   CORONARY ANGIOPLASTY WITH STENT PLACEMENT  1999; 2007   "1 + 1"   ERCP N/A 02/03/2022   Procedure: ENDOSCOPIC RETROGRADE CHOLANGIOPANCREATOGRAPHY (ERCP);  Surgeon: Jeani Hawking, MD;  Location: [facility] ENDOSCOPY;  Service: Gastroenterology;  Laterality: N/A;   ESOPHAGOGASTRODUODENOSCOPY (EGD) WITH PROPOFOL N/A 03/13/2019   Procedure: ESOPHAGOGASTRODUODENOSCOPY (EGD) WITH PROPOFOL;  Surgeon: Beverley Fiedler, MD;  Location: [facility] ENDOSCOPY;  Service: Gastroenterology;  Laterality: N/A;   ESOPHAGOGASTRODUODENOSCOPY (EGD) WITH PROPOFOL N/A 05/27/2020   Procedure: ESOPHAGOGASTRODUODENOSCOPY (EGD) WITH PROPOFOL;  Surgeon: Lanelle Bal, DO;  [facility]: [facility];  Service: Endoscopy;  Laterality: N/A;   ESOPHAGOGASTRODUODENOSCOPY (EGD) WITH PROPOFOL N/A 09/03/2022   Procedure: ESOPHAGOGASTRODUODENOSCOPY (EGD) WITH PROPOFOL;  Surgeon: Jeani Hawking, MD;  Location: [facility] ENDOSCOPY;  Service:  Gastroenterology;  Laterality: N/A;   EYE SURGERY Bilateral    lazer   FLEXIBLE SIGMOIDOSCOPY N/A 05/23/2022   Procedure: FLEXIBLE SIGMOIDOSCOPY;  Surgeon: Jeani Hawking, MD;  Location: [facility] ENDOSCOPY;  Service: Gastroenterology;  Laterality: N/A;   FLEXIBLE SIGMOIDOSCOPY N/A 05/24/2022   Procedure: FLEXIBLE SIGMOIDOSCOPY;  Surgeon: Imogene Burn, MD;  Location: [facility] ENDOSCOPY;  Service: Gastroenterology;  Laterality: N/A;   FLEXIBLE SIGMOIDOSCOPY N/A 09/03/2022   Procedure: FLEXIBLE SIGMOIDOSCOPY;  Surgeon: Jeani Hawking, MD;  Location: [facility] ENDOSCOPY;  Service: Gastroenterology;  Laterality: N/A;   HEMOSTASIS CLIP PLACEMENT  03/13/2019   Procedure: HEMOSTASIS CLIP PLACEMENT;  Surgeon: Beverley Fiedler, MD;  Location: [facility] ENDOSCOPY;  Service: Gastroenterology;;   HEMOSTASIS CLIP PLACEMENT  05/23/2022   Procedure: HEMOSTASIS CLIP PLACEMENT;  Surgeon: Jeani Hawking, MD;  Location: [facility] ENDOSCOPY;  Service: Gastroenterology;;   HEMOSTASIS CONTROL  05/24/2022   Procedure: HEMOSTASIS CONTROL;  Surgeon: Imogene Burn, MD;  Location: [facility] ENDOSCOPY;  Service: Gastroenterology;;   HOT HEMOSTASIS N/A 05/23/2022   Procedure: HOT HEMOSTASIS (ARGON PLASMA COAGULATION/BICAP);  Surgeon: Jeani Hawking, MD;  Location: [facility] ENDOSCOPY;  Service: Gastroenterology;  Laterality: N/A;   I & D EXTREMITY Left 05/05/2022   Procedure: IRRIGATION AND DEBRIDEMENT LEFT ARM AV FISTULA;  Surgeon: Cephus Shelling, MD;  Location: [facility] OR;  Service: Vascular;  Laterality: Left;   I & D EXTREMITY N/A 11/14/2022   Procedure: IRRIGATION AND DEBRIDEMENT OF LOWER EXTREMITY WOUND;  Surgeon: Nadara Mustard, MD;  Location: [facility] OR;  Service: Orthopedics;  Laterality: N/A;   INSERTION OF DIALYSIS CATHETER Right 04/02/2022   Procedure: INSERTION OF TUNNELED DIALYSIS CATHETER;  Surgeon: Nada Libman, MD;  Location: [facility] OR;  Service: Vascular;  Laterality: Right;   KNEE ARTHROSCOPY Left 10/25/2006   POLYPECTOMY  03/13/2019  Procedure: POLYPECTOMY;   Surgeon: Beverley Fiedler, MD;  Location: [facility] ENDOSCOPY;  Service: Gastroenterology;;   REMOVAL OF STONES  02/03/2022   Procedure: REMOVAL OF STONES;  Surgeon: Jeani Hawking, MD;  Location: [facility] ENDOSCOPY;  Service: Gastroenterology;;   REVISON OF ARTERIOVENOUS FISTULA Left 08/20/2022   Procedure: REVISON OF LEFT ARM ARTERIOVENOUS FISTULA;  Surgeon: Nada Libman, MD;  Location: [facility] OR;  Service: Vascular;  Laterality: Left;   RIGHT HEART CATH N/A 07/24/2017   Procedure: RIGHT HEART CATH;  Surgeon: Dolores Patty, MD;  Location: [facility];  Service: Cardiovascular;  Laterality: N/A;   RIGHT HEART CATHETERIZATION N/A 09/22/2013   Procedure: RIGHT HEART CATH;  Surgeon: Dolores Patty, MD;  Location: [facility] CATH LAB;  Service: Cardiovascular;  Laterality: N/A;   SHOULDER ARTHROSCOPY WITH OPEN ROTATOR CUFF REPAIR Right 03/14/2014   Procedure: RIGHT SHOULDER ARTHROSCOPY WITH BICEPS RELEASE, OPEN SUBSCAPULA REPAIR, OPEN SUPRASPINATUS REPAIR.;  Surgeon: Cammy Copa, MD;  Location: [facility] OR;  Service: Orthopedics;  Laterality: Right;   SPHINCTEROTOMY  02/03/2022   Procedure: SPHINCTEROTOMY;  Surgeon: Jeani Hawking, MD;  Location: [facility] ENDOSCOPY;  Service: Gastroenterology;;   STUMP REVISION Right 06/13/2022   Procedure: REVISION RIGHT BELOW KNEE AMPUTATION;  Surgeon: Nadara Mustard, MD;  Location[facility] OR;  Service: Orthopedics;  Laterality: Right;   TEE WITHOUT CARDIOVERSION N/A 02/04/2022   Procedure: TRANSESOPHAGEAL ECHOCARDIOGRAM (TEE);  Surgeon: Dolores Patty, MD;  Location: [facility] ENDOSCOPY;  Service: Cardiovascular;  Laterality: N/A;   THROMBECTOMY W/ EMBOLECTOMY Left 08/20/2022   Procedure: THROMBECTOMY OF LEFT ARM ARTERIOVENOUS FISTULA;  Surgeon: Nada Libman, MD;  Location: [facility] OR;  Service: Vascular;  Laterality: Left;   TOE AMPUTATION Right 02/24/2018   GREAT TOE AND 2ND TOE AMPUTATION   TUBAL LIGATION  1970's   Social History   Occupational History    Employer: UNEMPLOYED   Tobacco Use   Smoking status: Former    Current packs/day: 0.00    Average packs/day: 3.0 packs/day for 32.0 years (96.0 ttl pk-yrs)    Types: Cigarettes    Start date: 10/24/1965    Quit date: 10/24/1997    Years since quitting: 25.2   Smokeless tobacco: Never  Vaping Use   Vaping status: Never Used  Substance and Sexual Activity   Alcohol use: Not Currently    Comment: "might have 2-3 daiquiris in the summer"   Drug use: No   Sexual activity: Not Currently    Birth control/protection: Surgical    Comment: Hysterectomy

## 2023-01-06 ENCOUNTER — Encounter: Payer: Self-pay | Admitting: Orthopedic Surgery

## 2023-01-06 DIAGNOSIS — N2581 Secondary hyperparathyroidism of renal origin: Secondary | ICD-10-CM | POA: Diagnosis not present

## 2023-01-06 DIAGNOSIS — D689 Coagulation defect, unspecified: Secondary | ICD-10-CM | POA: Diagnosis not present

## 2023-01-06 DIAGNOSIS — N186 End stage renal disease: Secondary | ICD-10-CM | POA: Diagnosis not present

## 2023-01-06 DIAGNOSIS — Z992 Dependence on renal dialysis: Secondary | ICD-10-CM | POA: Diagnosis not present

## 2023-01-06 NOTE — Progress Notes (Signed)
Office Visit Note   Patient: Susan [name]           Date of Birth: 05-22-1951           MRN: [id number] Visit Date: 12/22/2022              Requested by: Donita Brooks, MD [address] [place],  [place] [address] PCP: Donita Brooks, MD  Chief Complaint  Patient presents with   Left Leg - Routine Post Op    Registry pt #16 visit #17 11/14/22 I&D left leg      HPI: Patient is a 72 year old woman presents in follow-up for chronic venous insufficiency left lower extremity.  Assessment & Plan: Visit Diagnoses:  1. Below-knee amputation of right lower extremity, sequela (HCC)   2. 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  Follow-Up Instructions: Return in about 1 week (around 12/29/2022).   Ortho Exam  Patient is alert, oriented, no adenopathy, well-dressed, normal affect, normal respiratory effort. Examination the wound bed was debrided back to healthy viable granulation tissue.  Measures 4 x 5 cm.  8 cm of Kerecis graft was applied.  The wound healing has stalled, the wound bed has healthy granulation tissue, and patient presents for evaluation and application of Kerecis MariGen Micro graft. After informed consent a 10 blade knife was used to debride the skin and soft tissue to healthy viable bleeding granulation tissue.  Silver nitrate was used for hemostasis. The wound measures: 5 cm in length, 4cm  in width, 1 mm in depth, wound location left leg Kerecis MariGen micro tissue graft 8 cm2 was applied, and there was no wastage.  Please see the photo below of the Lot number and expiration date. The micro tissue graft was covered with a nonadherent Adaptic dressing, bolstered with 4 x 4 gauze and secured with a compression wrap.     Imaging: No results found.     Labs: Lab Results  Component Value Date   HGBA1C 7.9 (H) 08/27/2022   HGBA1C 8.9 (H) 05/02/2022   HGBA1C >15.5  (H) 01/20/2022   ESRSEDRATE 17 11/10/2022   CRP 3.6 (H) 11/10/2022   CRP 0.6 09/01/2022   CRP 0.7 08/22/2020   LABURIC 4.7 08/09/2020   LABURIC 9.1 (H) 10/12/2018   LABURIC 9.0 (H) 03/06/2018   REPTSTATUS 11/19/2022 FINAL 11/14/2022   GRAMSTAIN  11/14/2022    MODERATE WBC PRESENT,BOTH PMN AND MONONUCLEAR RARE SQUAMOUS EPITHELIAL CELLS PRESENT NO ORGANISMS SEEN Performed at [facility], [address], [place], [place] [address]    CULT  11/14/2022    FEW PROTEUS MIRABILIS RARE KLEBSIELLA PNEUMONIAE NO ANAEROBES ISOLATED FEW ENTEROCOCCUS FAECALIS    LABORGA PROTEUS MIRABILIS 11/14/2022   LABORGA KLEBSIELLA PNEUMONIAE 11/14/2022   LABORGA ENTEROCOCCUS FAECALIS 11/14/2022     Lab Results  Component Value Date   ALBUMIN 3.2 (L) 12/11/2022   ALBUMIN 2.9 (L) 12/06/2022   ALBUMIN 3.1 (L) 12/02/2022   PREALBUMIN 12 (L) 11/10/2022   PREALBUMIN 13 (L) 05/02/2022    Lab Results  Component Value Date   MG 1.9 11/20/2022   MG 1.9 09/04/2022   MG 1.8 09/03/2022   Lab Results  Component Value Date   VD25OH 42.14 05/02/2022    Lab Results  Component Value Date   PREALBUMIN 12 (L) 11/10/2022   PREALBUMIN 13 (L) 05/02/2022      Latest Ref  Rng & Units 12/11/2022   11:29 AM 12/06/2022    3:38 PM 12/02/2022    1:58 PM  CBC EXTENDED  WBC 4.0 - 10.5 K/uL 5.9  6.8  7.0   RBC 3.87 - 5.11 MIL/uL 5.02  4.38  3.99   Hemoglobin 12.0 - 15.0 g/dL 16.1  09.6  9.7   HCT 04.5 - 46.0 % 39.2  34.7  31.2   Platelets 150 - 400 K/uL 255  275  250      There is no height or weight on file to calculate BMI.  Orders:  No orders of the defined types were placed in this encounter.  No orders of the defined types were placed in this encounter.    Procedures: No procedures performed  Clinical Data: No additional findings.  ROS:  All other systems negative, except as noted in the HPI. Review of Systems  Objective: Vital Signs: There were no vitals taken for this  visit.  Specialty Comments:  No specialty comments available.  PMFS History: Patient Active Problem List   Diagnosis Date Noted   Acute on chronic anemia 11/25/2022   Right below-knee amputee (HCC) 11/20/2022   Cellulitis of left lower extremity 11/14/2022   Maggot infestation 11/12/2022   Complicated wound infection 11/10/2022   Secondary hypercoagulable state (HCC) 09/04/2022   Right sided abdominal pain 08/31/2022   Constipation 06/07/2022   History of Clostridioides difficile colitis 06/06/2022   Below-knee amputation of right lower extremity (HCC) 06/06/2022   Diverticulitis 06/05/2022   Stercoral colitis 06/05/2022   C. 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) 04/07/2022   Allergy, unspecified, initial encounter 04/07/2022   Dependence on renal dialysis (HCC) 04/07/2022   Gout due to renal impairment, unspecified site 04/07/2022   Hypertensive heart and chronic kidney disease with heart failure and with stage 5 chronic kidney disease, or end stage renal disease (HCC) 04/07/2022   Personal history of transient ischemic attack (TIA), and cerebral infarction without residual deficits 04/07/2022   Renal osteodystrophy 04/07/2022   Venous stasis ulcer of right calf (HCC) 03/31/2022   Fistula, colovaginal 03/26/2022   Diarrhea 03/26/2022   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   Sepsis without acute organ dysfunction (HCC)     Bacteremia    Acute pancreatitis 02/01/2022   Abdominal pain 02/01/2022   SIRS (systemic inflammatory response syndrome) (HCC) 02/01/2022   Transaminitis 02/01/2022   History of anemia due to chronic kidney disease 02/01/2022   Paroxysmal atrial fibrillation (HCC) 02/01/2022   DKA (diabetic ketoacidosis) (HCC) 01/14/2022   NSTEMI (non-ST elevated myocardial infarction) (HCC) 03/05/2021   Acute renal failure superimposed on stage 4 chronic kidney disease (HCC) 08/22/2020   Hypoalbuminemia 05/25/2020   GERD (gastroesophageal reflux disease) 05/25/2020   Pressure injury of skin 05/17/2020   Acute on chronic combined systolic and diastolic congestive heart failure (HCC) 03/07/2020   Type 2 diabetes mellitus with diabetic polyneuropathy, with long-term current use of insulin (HCC) 03/07/2020   Obesity, Class III, BMI 40-49.9 (morbid obesity) (  HCC) 03/07/2020   Common bile duct (CBD) obstruction 05/28/2019   Benign neoplasm of ascending colon    Benign neoplasm of transverse colon    Benign neoplasm of descending colon    Benign neoplasm of sigmoid colon    Gastric polyps    Hyperkalemia 03/11/2019   Prolonged QT interval 03/11/2019   Acute blood loss anemia 03/11/2019   Onychomycosis 06/21/2018   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)    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 (HCC)    PVD (peripheral vascular disease) (HCC) 10/26/2017   E-coli UTI 07/27/2017   Hypothyroidism 07/27/2017   AKI (acute kidney injury) (HCC)    PAH (pulmonary artery hypertension) (HCC)    Impaired ambulation 07/19/2017   Nausea & vomiting 07/15/2017   Leg cramps 02/27/2017   Peripheral edema 01/12/2017   Diabetic neuropathy (HCC) 11/12/2016   CKD (chronic kidney disease), stage IV (HCC) 10/24/2015   Anemia of chronic disease 10/03/2015   Generalized anxiety disorder 10/03/2015   Insomnia 10/03/2015   Hyperglycemia due to diabetes mellitus (HCC) 06/07/2015   Chronic  diastolic CHF (congestive heart failure) (HCC) 06/07/2015   Non compliance with medical treatment 04/17/2014   Rotator cuff tear 03/14/2014   Class 3 obesity (HCC) 09/23/2013   Chronic HFrEF (heart failure with reduced ejection fraction) (HCC) 06/03/2013   Hypotension 12/25/2012   Urinary incontinence    MDD (major depressive disorder) 11/12/2010   RBBB (right bundle branch block)    Wide-complex tachycardia    Coronary artery disease    Hyperlipemia 01/22/2009   Essential hypertension 01/22/2009   Past Medical History:  Diagnosis Date   Anemia    hx   Anxiety    Arthritis    "generalized" (03/15/2014)   CAD (coronary artery disease)    MI in 2000 - MI  2007 - treated bare metal stent (no nuclear since then as 9/11)   Carotid artery disease (HCC)    Chronic diastolic heart failure (HCC)    a) ECHO (08/2013) EF 55-60% and RV function nl b) RHC (08/2013) RA 4, RV 30/5/7, PA 25/10 (16), PCWP 7, Fick CO/CI 6.3/2.7, PVR 1.5 WU, PA 61 and 66%   Daily headache    "~ every other day; since I fell in June" (03/15/2014)   Depression    Diabetic retinopathy (HCC)    Dyslipidemia    ESRD (end stage renal disease) (HCC)    Dialysis on Tues Thurs Sat   Exertional shortness of breath    History of kidney stones    HTN (hypertension)    Hypothyroidism    Obesity    Osteoarthritis    PAF (paroxysmal atrial fibrillation) (HCC)    Peripheral neuropathy    bilateral feet/hands   PONV (postoperative nausea and vomiting)    RBBB (right bundle branch block)    Old   Stroke (HCC)    mini strokes   Type II diabetes mellitus (HCC)    Type II, [name]. patient has omnipod insulin pump with Novolin R Insulin    Family History  Problem Relation Age of Onset   Heart attack Mother 58    Past Surgical History:  Procedure Laterality Date   A/V FISTULAGRAM Left 11/07/2022   Procedure: A/V Fistulagram;  Surgeon: Leonie Douglas, MD;  Location: [facility];  Service:  Cardiovascular;  Laterality: Left;   ABDOMINAL HYSTERECTOMY  1980's   AMPUTATION Right 02/24/2018   Procedure: RIGHT FOOT  GREAT TOE AND 2ND TOE AMPUTATION;  Surgeon: Nadara Mustard, MD;  Location: [facility] OR;  Service: Orthopedics;  Laterality: Right;   AMPUTATION Right 04/30/2018   Procedure: RIGHT TRANSMETATARSAL AMPUTATION;  Surgeon: Nadara Mustard, MD;  Location: [facility] OR;  Service: Orthopedics;  Laterality: Right;   AMPUTATION Right 05/02/2022   Procedure: RIGHT BELOW KNEE AMPUTATION;  Surgeon: Nadara Mustard, MD;  Location: [facility] OR;  Service: Orthopedics;  Laterality: Right;   APPLICATION OF WOUND VAC Right 06/13/2022   Procedure: APPLICATION OF WOUND VAC;  Surgeon: Nadara Mustard, MD;  Location: [facility] OR;  Service: Orthopedics;  Laterality: Right;   APPLICATION OF WOUND VAC Left 11/14/2022   Procedure: APPLICATION OF WOUND VAC;  Surgeon: Nadara Mustard, MD;  Location: [facility] OR;  Service: Orthopedics;  Laterality: Left;   AV FISTULA PLACEMENT Left 04/02/2022   Procedure: LEFT ARM ARTERIOVENOUS (AV) FISTULA CREATION;  Surgeon: Nada Libman, MD;  Location: [facility] OR;  Service: Vascular;  Laterality: Left;  PERIPHERAL NERVE BLOCK   BASCILIC VEIN TRANSPOSITION Left 07/31/2022   Procedure: LEFT ARM SECOND STAGE BASILIC VEIN TRANSPOSITION;  Surgeon: Nada Libman, MD;  Location: [facility] OR;  Service: Vascular;  Laterality: Left;   BIOPSY  05/27/2020   Procedure: BIOPSY;  Surgeon: Lanelle Bal, DO;  Location: [facility];  Service: Endoscopy;;   CATARACT EXTRACTION, BILATERAL Bilateral ?2013   COLONOSCOPY W/ POLYPECTOMY     COLONOSCOPY WITH PROPOFOL N/A 03/13/2019   Procedure: COLONOSCOPY WITH PROPOFOL;  Surgeon: Beverley Fiedler, MD;  Location: [facility] ENDOSCOPY;  Service: Gastroenterology;  Laterality: N/A;   CORONARY ANGIOPLASTY WITH STENT PLACEMENT  1999; 2007   "1 + 1"   ERCP N/A 02/03/2022   Procedure: ENDOSCOPIC RETROGRADE CHOLANGIOPANCREATOGRAPHY (ERCP);  Surgeon: Jeani Hawking, MD;  Location: [facility] ENDOSCOPY;   Service: Gastroenterology;  Laterality: N/A;   ESOPHAGOGASTRODUODENOSCOPY (EGD) WITH PROPOFOL N/A 03/13/2019   Procedure: ESOPHAGOGASTRODUODENOSCOPY (EGD) WITH PROPOFOL;  Surgeon: Beverley Fiedler, MD;  Location: [facility] ENDOSCOPY;  Service: Gastroenterology;  Laterality: N/A;   ESOPHAGOGASTRODUODENOSCOPY (EGD) WITH PROPOFOL N/A 05/27/2020   Procedure: ESOPHAGOGASTRODUODENOSCOPY (EGD) WITH PROPOFOL;  Surgeon: Lanelle Bal, DO;  [facility];  Service: Endoscopy;  Laterality: N/A;   ESOPHAGOGASTRODUODENOSCOPY (EGD) WITH PROPOFOL N/A 09/03/2022   Procedure: ESOPHAGOGASTRODUODENOSCOPY (EGD) WITH PROPOFOL;  Surgeon: Jeani Hawking, MD;  Location: [facility] ENDOSCOPY;  Service: Gastroenterology;  Laterality: N/A;   EYE SURGERY Bilateral    lazer   FLEXIBLE SIGMOIDOSCOPY N/A 05/23/2022   Procedure: FLEXIBLE SIGMOIDOSCOPY;  Surgeon: Jeani Hawking, MD;  Location: [facility] ENDOSCOPY;  Service: Gastroenterology;  Laterality: N/A;   FLEXIBLE SIGMOIDOSCOPY N/A 05/24/2022   Procedure: FLEXIBLE SIGMOIDOSCOPY;  Surgeon: Imogene Burn, MD;  Location: [facility] ENDOSCOPY;  Service: Gastroenterology;  Laterality: N/A;   FLEXIBLE SIGMOIDOSCOPY N/A 09/03/2022   Procedure: FLEXIBLE SIGMOIDOSCOPY;  Surgeon: Jeani Hawking, MD;  Location: [facility] ENDOSCOPY;  Service: Gastroenterology;  Laterality: N/A;   HEMOSTASIS CLIP PLACEMENT  03/13/2019   Procedure: HEMOSTASIS CLIP PLACEMENT;  Surgeon: Beverley Fiedler, MD;  Location: [facility] ENDOSCOPY;  Service: Gastroenterology;;   HEMOSTASIS CLIP PLACEMENT  05/23/2022   Procedure: HEMOSTASIS CLIP PLACEMENT;  Surgeon: Jeani Hawking, MD;  Location[facility] ENDOSCOPY;  Service: Gastroenterology;;   HEMOSTASIS CONTROL  05/24/2022   Procedure: HEMOSTASIS CONTROL;  Surgeon: Imogene Burn, MD;  Location: [facility] ENDOSCOPY;  Service: Gastroenterology;;   HOT HEMOSTASIS N/A 05/23/2022   Procedure: HOT HEMOSTASIS (ARGON PLASMA COAGULATION/BICAP);  Surgeon: Jeani Hawking, MD;  Location: [facility] ENDOSCOPY;  Service:  Gastroenterology;  Laterality: N/A;   I & D EXTREMITY Left 05/05/2022   Procedure: IRRIGATION AND DEBRIDEMENT LEFT ARM AV FISTULA;  Surgeon: Cephus Shelling, MD;  Location: [facility] OR;  Service: Vascular;  Laterality: Left;   I & D EXTREMITY N/A 11/14/2022   Procedure: IRRIGATION AND DEBRIDEMENT OF LOWER EXTREMITY WOUND;  Surgeon: Nadara Mustard, MD;  Location: [facility] OR;  Service: Orthopedics;  Laterality: N/A;   INSERTION OF DIALYSIS CATHETER Right 04/02/2022   Procedure: INSERTION OF TUNNELED DIALYSIS CATHETER;  Surgeon: Nada Libman, MD;  Location: [facility] OR;  Service: Vascular;  Laterality: Right;   KNEE ARTHROSCOPY Left 10/25/2006   POLYPECTOMY  03/13/2019   Procedure: POLYPECTOMY;  Surgeon: Beverley Fiedler, MD;  Location: [facility];  Service: Gastroenterology;;   REMOVAL OF STONES  02/03/2022   Procedure: REMOVAL OF STONES;  Surgeon: Jeani Hawking, MD;  Location: [facility] ENDOSCOPY;  Service: Gastroenterology;;   REVISON OF ARTERIOVENOUS FISTULA Left 08/20/2022   Procedure: REVISON OF LEFT ARM ARTERIOVENOUS FISTULA;  Surgeon: Nada Libman, MD;  Location: [facility] OR;  Service: Vascular;  Laterality: Left;   RIGHT HEART CATH N/A 07/24/2017   Procedure: RIGHT HEART CATH;  Surgeon: Dolores Patty, MD;  Location: [facility];  Service: Cardiovascular;  Laterality: N/A;   RIGHT HEART CATHETERIZATION N/A 09/22/2013   Procedure: RIGHT HEART CATH;  Surgeon: Dolores Patty, MD;  Location: [facility] CATH LAB;  Service: Cardiovascular;  Laterality: N/A;   SHOULDER ARTHROSCOPY WITH OPEN ROTATOR CUFF REPAIR Right 03/14/2014   Procedure: RIGHT SHOULDER ARTHROSCOPY WITH BICEPS RELEASE, OPEN SUBSCAPULA REPAIR, OPEN SUPRASPINATUS REPAIR.;  Surgeon: Cammy Copa, MD;  Location: [facility] OR;  Service: Orthopedics;  Laterality: Right;   SPHINCTEROTOMY  02/03/2022   Procedure: SPHINCTEROTOMY;  Surgeon: Jeani Hawking, MD;  Location: [facility] ENDOSCOPY;  Service: Gastroenterology;;   STUMP REVISION Right 06/13/2022    Procedure: REVISION RIGHT BELOW KNEE AMPUTATION;  Surgeon: Nadara Mustard, MD;  Location: [facility] OR;  Service: Orthopedics;  Laterality: Right;   TEE WITHOUT CARDIOVERSION N/A 02/04/2022   Procedure: TRANSESOPHAGEAL ECHOCARDIOGRAM (TEE);  Surgeon: Dolores Patty, MD;  Location: [facility] ENDOSCOPY;  Service: Cardiovascular;  Laterality: N/A;   THROMBECTOMY W/ EMBOLECTOMY Left 08/20/2022   Procedure: THROMBECTOMY OF LEFT ARM ARTERIOVENOUS FISTULA;  Surgeon: Nada Libman, MD;  Location: [facility] OR;  Service: Vascular;  Laterality: Left;   TOE AMPUTATION Right 02/24/2018   GREAT TOE AND 2ND TOE AMPUTATION   TUBAL LIGATION  1970's   Social History   Occupational History    Employer: UNEMPLOYED  Tobacco Use   Smoking status: Former    Current packs/day: 0.00    Average packs/day: 3.0 packs/day for 32.0 years (96.0 ttl pk-yrs)    Types: Cigarettes    Start date: 10/24/1965    Quit date: 10/24/1997    Years since quitting: 25.2   Smokeless tobacco: Never  Vaping Use   Vaping status: Never Used  Substance and Sexual Activity   Alcohol use: Not Currently    Comment: "might have 2-3 daiquiris in the summer"   Drug use: No   Sexual activity: Not Currently    Birth control/protection: Surgical    Comment: Hysterectomy

## 2023-01-09 ENCOUNTER — Other Ambulatory Visit: Payer: Self-pay

## 2023-01-09 ENCOUNTER — Other Ambulatory Visit (HOSPITAL_COMMUNITY): Payer: Self-pay | Admitting: Internal Medicine

## 2023-01-09 DIAGNOSIS — N184 Chronic kidney disease, stage 4 (severe): Secondary | ICD-10-CM

## 2023-01-09 DIAGNOSIS — N189 Chronic kidney disease, unspecified: Secondary | ICD-10-CM

## 2023-01-09 DIAGNOSIS — R600 Localized edema: Secondary | ICD-10-CM

## 2023-01-09 DIAGNOSIS — K219 Gastro-esophageal reflux disease without esophagitis: Secondary | ICD-10-CM

## 2023-01-09 DIAGNOSIS — E1142 Type 2 diabetes mellitus with diabetic polyneuropathy: Secondary | ICD-10-CM

## 2023-01-09 MED ORDER — PANTOPRAZOLE SODIUM 40 MG PO TBEC
40.0000 mg | DELAYED_RELEASE_TABLET | Freq: Two times a day (BID) | ORAL | 1 refills | Status: DC
Start: 2023-01-09 — End: 2023-07-06

## 2023-01-09 MED ORDER — METOCLOPRAMIDE HCL 5 MG PO TABS: 5.0000 mg | ORAL_TABLET | Freq: Three times a day (TID) | ORAL | 3 refills | Status: DC

## 2023-01-09 MED ORDER — FOLIC ACID 1 MG PO TABS
1.0000 mg | ORAL_TABLET | Freq: Every day | ORAL | 1 refills | Status: DC
Start: 2023-01-09 — End: 2023-02-02

## 2023-01-11 DIAGNOSIS — Z89511 Acquired absence of right leg below knee: Secondary | ICD-10-CM | POA: Diagnosis not present

## 2023-01-11 DIAGNOSIS — I5022 Chronic systolic (congestive) heart failure: Secondary | ICD-10-CM | POA: Diagnosis not present

## 2023-01-11 DIAGNOSIS — M869 Osteomyelitis, unspecified: Secondary | ICD-10-CM | POA: Diagnosis not present

## 2023-01-11 DIAGNOSIS — N186 End stage renal disease: Secondary | ICD-10-CM | POA: Diagnosis not present

## 2023-01-12 ENCOUNTER — Ambulatory Visit (INDEPENDENT_AMBULATORY_CARE_PROVIDER_SITE_OTHER): Payer: PPO | Admitting: Orthopedic Surgery

## 2023-01-12 DIAGNOSIS — I87332 Chronic venous hypertension (idiopathic) with ulcer and inflammation of left lower extremity: Secondary | ICD-10-CM

## 2023-01-13 ENCOUNTER — Encounter: Payer: Self-pay | Admitting: Orthopedic Surgery

## 2023-01-13 DIAGNOSIS — D689 Coagulation defect, unspecified: Secondary | ICD-10-CM | POA: Diagnosis not present

## 2023-01-13 DIAGNOSIS — N2581 Secondary hyperparathyroidism of renal origin: Secondary | ICD-10-CM | POA: Diagnosis not present

## 2023-01-13 DIAGNOSIS — Z992 Dependence on renal dialysis: Secondary | ICD-10-CM | POA: Diagnosis not present

## 2023-01-13 DIAGNOSIS — N186 End stage renal disease: Secondary | ICD-10-CM | POA: Diagnosis not present

## 2023-01-13 NOTE — Progress Notes (Signed)
Office Visit Note   Patient: Susan [name]           Date of Birth: [birth year]           MRN: [id number] Visit Date: 01/12/2023              Requested by: Donita Brooks, MD [address] [place],  [place] [address] PCP: Donita Brooks, MD  Chief Complaint  Patient presents with   Left Leg - Follow-up    Registry pt #16      HPI: Patient is a 72 year old woman who is seen in follow-up for venous ulcer left leg.  She is status post right transtibial amputation.  Patient states the dialysis nurse was concerned about the color of the right residual limb.  Assessment & Plan: Visit Diagnoses:  1. 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  The brawny edema seems most consistent with her venous insufficiency that is present in the left leg as well.  Donated Kerecis graft applied to the left leg.  Follow-Up Instructions: Return in about 1 week (around 01/19/2023).   Ortho Exam  Patient is alert, oriented, no adenopathy, well-dressed, normal affect, normal respiratory effort. Examination of the right leg she has a stable transtibial amputation no redness cellulitis no ulcers no drainage there is some brawny skin color changes from the venous insufficiency.  No temperature changes.  The left leg venous ulcer has hypergranulation tissue this was touched with silver nitrate.  The wound measures 4 x 2 cm.  Donated Kerecis tissue graft was applied followed by dry compressive dressing.  The wound was debrided back to bleeding viable granulation tissue prior to application of the tissue graft.  Imaging: No results found.    Labs: Lab Results  Component Value Date   HGBA1C 7.9 (H) 08/27/2022   HGBA1C 8.9 (H) 05/02/2022   HGBA1C >15.5 (H) 01/20/2022   ESRSEDRATE 17 11/10/2022   CRP 3.6 (H) 11/10/2022   CRP 0.6 09/01/2022   CRP 0.7 08/22/2020   LABURIC 4.7 08/09/2020    LABURIC 9.1 (H) 10/12/2018   LABURIC 9.0 (H) 03/06/2018   REPTSTATUS 11/19/2022 FINAL 11/14/2022   GRAMSTAIN  11/14/2022    MODERATE WBC PRESENT,BOTH PMN AND MONONUCLEAR RARE SQUAMOUS EPITHELIAL CELLS PRESENT NO ORGANISMS SEEN Performed at [facility], [address] [place], [place] [address]    CULT  11/14/2022    FEW PROTEUS MIRABILIS RARE KLEBSIELLA PNEUMONIAE NO ANAEROBES ISOLATED FEW ENTEROCOCCUS FAECALIS    LABORGA PROTEUS MIRABILIS 11/14/2022   LABORGA KLEBSIELLA PNEUMONIAE 11/14/2022   LABORGA ENTEROCOCCUS FAECALIS 11/14/2022     Lab Results  Component Value Date   ALBUMIN 3.2 (L) 12/11/2022   ALBUMIN 2.9 (L) 12/06/2022   ALBUMIN 3.1 (L) 12/02/2022   PREALBUMIN 12 (L) 11/10/2022   PREALBUMIN 13 (L) 05/02/2022    Lab Results  Component Value Date   MG 1.9 11/20/2022   MG 1.9 09/04/2022   MG 1.8 09/03/2022   Lab Results  Component Value Date   VD25OH 42.14 05/02/2022    Lab Results  Component Value Date   PREALBUMIN 12 (L) 11/10/2022   PREALBUMIN 13 (L) 05/02/2022      Latest Ref Rng & Units 12/11/2022   11:29 AM 12/06/2022    3:38 PM 12/02/2022    1:58 PM  CBC EXTENDED  WBC 4.0 - 10.5  K/uL 5.9  6.8  7.0   RBC 3.87 - 5.11 MIL/uL 5.02  4.38  3.99   Hemoglobin 12.0 - 15.0 g/dL 40.9  81.1  9.7   HCT 91.4 - 46.0 % 39.2  34.7  31.2   Platelets 150 - 400 K/uL 255  275  250      There is no height or weight on file to calculate BMI.  Orders:  No orders of the defined types were placed in this encounter.  No orders of the defined types were placed in this encounter.    Procedures: No procedures performed  Clinical Data: No additional findings.  ROS:  All other systems negative, except as noted in the HPI. Review of Systems  Objective: Vital Signs: There were no vitals taken for this visit.  Specialty Comments:  No specialty comments available.  PMFS History: Patient Active Problem List   Diagnosis Date Noted   Acute on  chronic anemia 11/25/2022   Right below-knee amputee (HCC) 11/20/2022   Cellulitis of left lower extremity 11/14/2022   Maggot infestation 11/12/2022   Complicated wound infection 11/10/2022   Secondary hypercoagulable state (HCC) 09/04/2022   Right sided abdominal pain 08/31/2022   Constipation 06/07/2022   History of Clostridioides difficile colitis 06/06/2022   Below-knee amputation of right lower extremity (HCC) 06/06/2022   Diverticulitis 06/05/2022   Stercoral colitis 06/05/2022   C. 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) 04/07/2022   Allergy, unspecified, initial encounter 04/07/2022   Dependence on renal dialysis (HCC) 04/07/2022   Gout due to renal impairment, unspecified site 04/07/2022   Hypertensive heart and chronic kidney disease with heart failure and with stage 5 chronic kidney disease, or end stage renal disease (HCC) 04/07/2022   Personal history of transient ischemic attack (TIA), and cerebral infarction without residual deficits 04/07/2022   Renal osteodystrophy 04/07/2022   Venous stasis ulcer of right calf (HCC) 03/31/2022   Fistula, colovaginal 03/26/2022   Diarrhea 03/26/2022   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   Sepsis without acute organ dysfunction (HCC)    Bacteremia    Acute pancreatitis 02/01/2022   Abdominal pain 02/01/2022   SIRS (systemic inflammatory response syndrome) (HCC) 02/01/2022    Transaminitis 02/01/2022   History of anemia due to chronic kidney disease 02/01/2022   Paroxysmal atrial fibrillation (HCC) 02/01/2022   DKA (diabetic ketoacidosis) (HCC) 01/14/2022   NSTEMI (non-ST elevated myocardial infarction) (HCC) 03/05/2021   Acute renal failure superimposed on stage 4 chronic kidney disease (HCC) 08/22/2020   Hypoalbuminemia 05/25/2020   GERD (gastroesophageal reflux disease) 05/25/2020   Pressure injury of skin 05/17/2020   Acute on chronic combined systolic and diastolic congestive heart failure (HCC) 03/07/2020   Type 2 diabetes mellitus with diabetic polyneuropathy, with long-term current use of insulin (HCC) 03/07/2020   Obesity, Class III, BMI 40-49.9 (morbid obesity) (HCC) 03/07/2020   Common bile duct (CBD) obstruction 05/28/2019   Benign neoplasm of ascending colon    Benign neoplasm of transverse colon  Benign neoplasm of descending colon    Benign neoplasm of sigmoid colon    Gastric polyps    Hyperkalemia 03/11/2019   Prolonged QT interval 03/11/2019   Acute blood loss anemia 03/11/2019   Onychomycosis 06/21/2018   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)    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 (HCC)    PVD (peripheral vascular disease) (HCC) 10/26/2017   E-coli UTI 07/27/2017   Hypothyroidism 07/27/2017   AKI (acute kidney injury) (HCC)    PAH (pulmonary artery hypertension) (HCC)    Impaired ambulation 07/19/2017   Nausea & vomiting 07/15/2017   Leg cramps 02/27/2017   Peripheral edema 01/12/2017   Diabetic neuropathy (HCC) 11/12/2016   CKD (chronic kidney disease), stage IV (HCC) 10/24/2015   Anemia of chronic disease 10/03/2015   Generalized anxiety disorder 10/03/2015   Insomnia 10/03/2015   Hyperglycemia due to diabetes mellitus (HCC) 06/07/2015   Chronic diastolic CHF (congestive heart failure) (HCC) 06/07/2015   Non compliance with medical treatment 04/17/2014   Rotator cuff tear 03/14/2014   Class  3 obesity (HCC) 09/23/2013   Chronic HFrEF (heart failure with reduced ejection fraction) (HCC) 06/03/2013   Hypotension 12/25/2012   Urinary incontinence    MDD (major depressive disorder) 11/12/2010   RBBB (right bundle branch block)    Wide-complex tachycardia    Coronary artery disease    Hyperlipemia 01/22/2009   Essential hypertension 01/22/2009   Past Medical History:  Diagnosis Date   Anemia    hx   Anxiety    Arthritis    "generalized" (03/15/2014)   CAD (coronary artery disease)    MI in 2000 - MI  2007 - treated bare metal stent (no nuclear since then as 9/11)   Carotid artery disease (HCC)    Chronic diastolic heart failure (HCC)    a) ECHO (08/2013) EF 55-60% and RV function nl b) RHC (08/2013) RA 4, RV 30/5/7, PA 25/10 (16), PCWP 7, Fick CO/CI 6.3/2.7, PVR 1.5 WU, PA 61 and 66%   Daily headache    "~ every other day; since I fell in June" (03/15/2014)   Depression    Diabetic retinopathy (HCC)    Dyslipidemia    ESRD (end stage renal disease) (HCC)    Dialysis on Tues Thurs Sat   Exertional shortness of breath    History of kidney stones    HTN (hypertension)    Hypothyroidism    Obesity    Osteoarthritis    PAF (paroxysmal atrial fibrillation) (HCC)    Peripheral neuropathy    bilateral feet/hands   PONV (postoperative nausea and vomiting)    RBBB (right bundle branch block)    Old   Stroke (HCC)    mini strokes   Type II diabetes mellitus (HCC)    Type II, [name]. patient has omnipod insulin pump with Novolin R Insulin    Family History  Problem Relation Age of Onset   Heart attack Mother 60    Past Surgical History:  Procedure Laterality Date   A/V FISTULAGRAM Left 11/07/2022   Procedure: A/V Fistulagram;  Surgeon: Leonie Douglas, MD;  Location: [facility];  Service: Cardiovascular;  Laterality: Left;   ABDOMINAL HYSTERECTOMY  1980's   AMPUTATION Right 02/24/2018   Procedure: RIGHT FOOT GREAT TOE AND 2ND TOE  AMPUTATION;  Surgeon: Nadara Mustard, MD;  Location: [facility] OR;  Service: Orthopedics;  Laterality: Right;   AMPUTATION Right 04/30/2018  Procedure: RIGHT TRANSMETATARSAL AMPUTATION;  Surgeon: Nadara Mustard, MD;  Location: [facility] OR;  Service: Orthopedics;  Laterality: Right;   AMPUTATION Right 05/02/2022   Procedure: RIGHT BELOW KNEE AMPUTATION;  Surgeon: Nadara Mustard, MD;  Location: [facility] OR;  Service: Orthopedics;  Laterality: Right;   APPLICATION OF WOUND VAC Right 06/13/2022   Procedure: APPLICATION OF WOUND VAC;  Surgeon: Nadara Mustard, MD;  Location: [facility] OR;  Service: Orthopedics;  Laterality: Right;   APPLICATION OF WOUND VAC Left 11/14/2022   Procedure: APPLICATION OF WOUND VAC;  Surgeon: Nadara Mustard, MD;  Location: [facility] OR;  Service: Orthopedics;  Laterality: Left;   AV FISTULA PLACEMENT Left 04/02/2022   Procedure: LEFT ARM ARTERIOVENOUS (AV) FISTULA CREATION;  Surgeon: Nada Libman, MD;  Location: [facility] OR;  Service: Vascular;  Laterality: Left;  PERIPHERAL NERVE BLOCK   BASCILIC VEIN TRANSPOSITION Left 07/31/2022   Procedure: LEFT ARM SECOND STAGE BASILIC VEIN TRANSPOSITION;  Surgeon: Nada Libman, MD;  Location: [facility] OR;  Service: Vascular;  Laterality: Left;   BIOPSY  05/27/2020   Procedure: BIOPSY;  Surgeon: Lanelle Bal, DO;  Location: [facility];  Service: Endoscopy;;   CATARACT EXTRACTION, BILATERAL Bilateral ?2013   COLONOSCOPY W/ POLYPECTOMY     COLONOSCOPY WITH PROPOFOL N/A 03/13/2019   Procedure: COLONOSCOPY WITH PROPOFOL;  Surgeon: Beverley Fiedler, MD;  Location: [facility] ENDOSCOPY;  Service: Gastroenterology;  Laterality: N/A;   CORONARY ANGIOPLASTY WITH STENT PLACEMENT  1999; 2007   "1 + 1"   ERCP N/A 02/03/2022   Procedure: ENDOSCOPIC RETROGRADE CHOLANGIOPANCREATOGRAPHY (ERCP);  Surgeon: Jeani Hawking, MD;  Location: [facility] ENDOSCOPY;  Service: Gastroenterology;  Laterality: N/A;   ESOPHAGOGASTRODUODENOSCOPY (EGD) WITH PROPOFOL N/A 03/13/2019   Procedure: ESOPHAGOGASTRODUODENOSCOPY  (EGD) WITH PROPOFOL;  Surgeon: Beverley Fiedler, MD;  Location: [facility] ENDOSCOPY;  Service: Gastroenterology;  Laterality: N/A;   ESOPHAGOGASTRODUODENOSCOPY (EGD) WITH PROPOFOL N/A 05/27/2020   Procedure: ESOPHAGOGASTRODUODENOSCOPY (EGD) WITH PROPOFOL;  Surgeon: Lanelle Bal, DO;  [facility];  Service: Endoscopy;  Laterality: N/A;   ESOPHAGOGASTRODUODENOSCOPY (EGD) WITH PROPOFOL N/A 09/03/2022   Procedure: ESOPHAGOGASTRODUODENOSCOPY (EGD) WITH PROPOFOL;  Surgeon: Jeani Hawking, MD;  Location: [facility] ENDOSCOPY;  Service: Gastroenterology;  Laterality: N/A;   EYE SURGERY Bilateral    lazer   FLEXIBLE SIGMOIDOSCOPY N/A 05/23/2022   Procedure: FLEXIBLE SIGMOIDOSCOPY;  Surgeon: Jeani Hawking, MD;  Location: [facility] ENDOSCOPY;  Service: Gastroenterology;  Laterality: N/A;   FLEXIBLE SIGMOIDOSCOPY N/A 05/24/2022   Procedure: FLEXIBLE SIGMOIDOSCOPY;  Surgeon: Imogene Burn, MD;  Location: [facility] ENDOSCOPY;  Service: Gastroenterology;  Laterality: N/A;   FLEXIBLE SIGMOIDOSCOPY N/A 09/03/2022   Procedure: FLEXIBLE SIGMOIDOSCOPY;  Surgeon: Jeani Hawking, MD;  Location: [facility] ENDOSCOPY;  Service: Gastroenterology;  Laterality: N/A;   HEMOSTASIS CLIP PLACEMENT  03/13/2019   Procedure: HEMOSTASIS CLIP PLACEMENT;  Surgeon: Beverley Fiedler, MD;  Location: [facility] ENDOSCOPY;  Service: Gastroenterology;;   HEMOSTASIS CLIP PLACEMENT  05/23/2022   Procedure: HEMOSTASIS CLIP PLACEMENT;  Surgeon: Jeani Hawking, MD;  Location: [facility] ENDOSCOPY;  Service: Gastroenterology;;   HEMOSTASIS CONTROL  05/24/2022   Procedure: HEMOSTASIS CONTROL;  Surgeon: Imogene Burn, MD;  Location: [facility] ENDOSCOPY;  Service: Gastroenterology;;   HOT HEMOSTASIS N/A 05/23/2022   Procedure: HOT HEMOSTASIS (ARGON PLASMA COAGULATION/BICAP);  Surgeon: Jeani Hawking, MD;  Location: [facility] ENDOSCOPY;  Service: Gastroenterology;  Laterality: N/A;   I & D EXTREMITY Left 05/05/2022   Procedure: IRRIGATION AND DEBRIDEMENT LEFT ARM AV FISTULA;  Surgeon: Cephus Shelling,  MD;  Location:  [facility] OR;  Service: Vascular;  Laterality: Left;   I & D EXTREMITY N/A 11/14/2022   Procedure: IRRIGATION AND DEBRIDEMENT OF LOWER EXTREMITY WOUND;  Surgeon: Nadara Mustard, MD;  Location: [facility] OR;  Service: Orthopedics;  Laterality: N/A;   INSERTION OF DIALYSIS CATHETER Right 04/02/2022   Procedure: INSERTION OF TUNNELED DIALYSIS CATHETER;  Surgeon: Nada Libman, MD;  Location: [facility] OR;  Service: Vascular;  Laterality: Right;   KNEE ARTHROSCOPY Left 10/25/2006   POLYPECTOMY  03/13/2019   Procedure: POLYPECTOMY;  Surgeon: Beverley Fiedler, MD;  Location: [facility];  Service: Gastroenterology;;   REMOVAL OF STONES  02/03/2022   Procedure: REMOVAL OF STONES;  Surgeon: Jeani Hawking, MD;  Location: [facility] ENDOSCOPY;  Service: Gastroenterology;;   REVISON OF ARTERIOVENOUS FISTULA Left 08/20/2022   Procedure: REVISON OF LEFT ARM ARTERIOVENOUS FISTULA;  Surgeon: Nada Libman, MD;  Location: [facility] OR;  Service: Vascular;  Laterality: Left;   RIGHT HEART CATH N/A 07/24/2017   Procedure: RIGHT HEART CATH;  Surgeon: Dolores Patty, MD;  Location: [facility];  Service: Cardiovascular;  Laterality: N/A;   RIGHT HEART CATHETERIZATION N/A 09/22/2013   Procedure: RIGHT HEART CATH;  Surgeon: Dolores Patty, MD;  Location: [facility] CATH LAB;  Service: Cardiovascular;  Laterality: N/A;   SHOULDER ARTHROSCOPY WITH OPEN ROTATOR CUFF REPAIR Right 03/14/2014   Procedure: RIGHT SHOULDER ARTHROSCOPY WITH BICEPS RELEASE, OPEN SUBSCAPULA REPAIR, OPEN SUPRASPINATUS REPAIR.;  Surgeon: Cammy Copa, MD;  Location: [facility] OR;  Service: Orthopedics;  Laterality: Right;   SPHINCTEROTOMY  02/03/2022   Procedure: SPHINCTEROTOMY;  Surgeon: Jeani Hawking, MD;  Location: [facility] ENDOSCOPY;  Service: Gastroenterology;;   STUMP REVISION Right 06/13/2022   Procedure: REVISION RIGHT BELOW KNEE AMPUTATION;  Surgeon: Nadara Mustard, MD;  Location: [facility] OR;  Service: Orthopedics;  Laterality: Right;   TEE WITHOUT CARDIOVERSION N/A  02/04/2022   Procedure: TRANSESOPHAGEAL ECHOCARDIOGRAM (TEE);  Surgeon: Dolores Patty, MD;  Location: [facility] ENDOSCOPY;  Service: Cardiovascular;  Laterality: N/A;   THROMBECTOMY W/ EMBOLECTOMY Left 08/20/2022   Procedure: THROMBECTOMY OF LEFT ARM ARTERIOVENOUS FISTULA;  Surgeon: Nada Libman, MD;  Location: [facility] OR;  Service: Vascular;  Laterality: Left;   TOE AMPUTATION Right 02/24/2018   GREAT TOE AND 2ND TOE AMPUTATION   TUBAL LIGATION  1970's   Social History   Occupational History    Employer: UNEMPLOYED  Tobacco Use   Smoking status: Former    Current packs/day: 0.00    Average packs/day: 3.0 packs/day for 32.0 years (96.0 ttl pk-yrs)    Types: Cigarettes    Start date: 10/24/1965    Quit date: 10/24/1997    Years since quitting: 25.2   Smokeless tobacco: Never  Vaping Use   Vaping status: Never Used  Substance and Sexual Activity   Alcohol use: Not Currently    Comment: "might have 2-3 daiquiris in the summer"   Drug use: No   Sexual activity: Not Currently    Birth control/protection: Surgical    Comment: Hysterectomy

## 2023-01-15 ENCOUNTER — Other Ambulatory Visit: Payer: Self-pay

## 2023-01-15 ENCOUNTER — Encounter (HOSPITAL_COMMUNITY): Payer: Self-pay | Admitting: Internal Medicine

## 2023-01-15 ENCOUNTER — Inpatient Hospital Stay (HOSPITAL_COMMUNITY)
Admission: EM | Admit: 2023-01-15 | Discharge: 2023-01-17 | DRG: 637 | Disposition: A | Discharge status: Home / Self Care | Payer: PPO | Attending: Family Medicine | Admitting: Family Medicine

## 2023-01-15 ENCOUNTER — Emergency Department (HOSPITAL_COMMUNITY): Payer: PPO

## 2023-01-15 ENCOUNTER — Inpatient Hospital Stay (HOSPITAL_COMMUNITY): Payer: PPO

## 2023-01-15 DIAGNOSIS — R109 Unspecified abdominal pain: Secondary | ICD-10-CM | POA: Diagnosis not present

## 2023-01-15 DIAGNOSIS — Z6830 Body mass index (BMI) 30.0-30.9, adult: Secondary | ICD-10-CM

## 2023-01-15 DIAGNOSIS — K219 Gastro-esophageal reflux disease without esophagitis: Secondary | ICD-10-CM | POA: Diagnosis not present

## 2023-01-15 DIAGNOSIS — I959 Hypotension, unspecified: Secondary | ICD-10-CM | POA: Diagnosis not present

## 2023-01-15 DIAGNOSIS — I5042 Chronic combined systolic (congestive) and diastolic (congestive) heart failure: Secondary | ICD-10-CM | POA: Diagnosis not present

## 2023-01-15 DIAGNOSIS — Z992 Dependence on renal dialysis: Secondary | ICD-10-CM | POA: Diagnosis not present

## 2023-01-15 DIAGNOSIS — K6289 Other specified diseases of anus and rectum: Secondary | ICD-10-CM | POA: Diagnosis not present

## 2023-01-15 DIAGNOSIS — R112 Nausea with vomiting, unspecified: Secondary | ICD-10-CM | POA: Diagnosis not present

## 2023-01-15 DIAGNOSIS — R519 Headache, unspecified: Secondary | ICD-10-CM | POA: Diagnosis not present

## 2023-01-15 DIAGNOSIS — Z8673 Personal history of transient ischemic attack (TIA), and cerebral infarction without residual deficits: Secondary | ICD-10-CM

## 2023-01-15 DIAGNOSIS — Z89511 Acquired absence of right leg below knee: Secondary | ICD-10-CM | POA: Diagnosis not present

## 2023-01-15 DIAGNOSIS — E1165 Type 2 diabetes mellitus with hyperglycemia: Secondary | ICD-10-CM | POA: Diagnosis not present

## 2023-01-15 DIAGNOSIS — N186 End stage renal disease: Secondary | ICD-10-CM

## 2023-01-15 DIAGNOSIS — Z7901 Long term (current) use of anticoagulants: Secondary | ICD-10-CM

## 2023-01-15 DIAGNOSIS — I2510 Atherosclerotic heart disease of native coronary artery without angina pectoris: Secondary | ICD-10-CM | POA: Diagnosis present

## 2023-01-15 DIAGNOSIS — D631 Anemia in chronic kidney disease: Secondary | ICD-10-CM | POA: Diagnosis present

## 2023-01-15 DIAGNOSIS — E1142 Type 2 diabetes mellitus with diabetic polyneuropathy: Secondary | ICD-10-CM | POA: Diagnosis not present

## 2023-01-15 DIAGNOSIS — N2889 Other specified disorders of kidney and ureter: Secondary | ICD-10-CM | POA: Diagnosis not present

## 2023-01-15 DIAGNOSIS — E8770 Fluid overload, unspecified: Secondary | ICD-10-CM | POA: Diagnosis not present

## 2023-01-15 DIAGNOSIS — I9589 Other hypotension: Secondary | ICD-10-CM | POA: Diagnosis present

## 2023-01-15 DIAGNOSIS — E039 Hypothyroidism, unspecified: Secondary | ICD-10-CM | POA: Diagnosis not present

## 2023-01-15 DIAGNOSIS — Z888 Allergy status to other drugs, medicaments and biological substances status: Secondary | ICD-10-CM

## 2023-01-15 DIAGNOSIS — I5023 Acute on chronic systolic (congestive) heart failure: Secondary | ICD-10-CM | POA: Diagnosis present

## 2023-01-15 DIAGNOSIS — E1110 Type 2 diabetes mellitus with ketoacidosis without coma: Secondary | ICD-10-CM | POA: Diagnosis not present

## 2023-01-15 DIAGNOSIS — D638 Anemia in other chronic diseases classified elsewhere: Secondary | ICD-10-CM

## 2023-01-15 DIAGNOSIS — I480 Paroxysmal atrial fibrillation: Secondary | ICD-10-CM | POA: Diagnosis not present

## 2023-01-15 DIAGNOSIS — R0902 Hypoxemia: Secondary | ICD-10-CM | POA: Diagnosis not present

## 2023-01-15 DIAGNOSIS — M898X9 Other specified disorders of bone, unspecified site: Secondary | ICD-10-CM | POA: Diagnosis present

## 2023-01-15 DIAGNOSIS — R0989 Other specified symptoms and signs involving the circulatory and respiratory systems: Secondary | ICD-10-CM | POA: Diagnosis not present

## 2023-01-15 DIAGNOSIS — F32A Depression, unspecified: Secondary | ICD-10-CM | POA: Diagnosis not present

## 2023-01-15 DIAGNOSIS — Z794 Long term (current) use of insulin: Secondary | ICD-10-CM

## 2023-01-15 DIAGNOSIS — Z8249 Family history of ischemic heart disease and other diseases of the circulatory system: Secondary | ICD-10-CM

## 2023-01-15 DIAGNOSIS — E1122 Type 2 diabetes mellitus with diabetic chronic kidney disease: Secondary | ICD-10-CM | POA: Diagnosis not present

## 2023-01-15 DIAGNOSIS — D735 Infarction of spleen: Secondary | ICD-10-CM | POA: Diagnosis not present

## 2023-01-15 DIAGNOSIS — Z993 Dependence on wheelchair: Secondary | ICD-10-CM | POA: Diagnosis not present

## 2023-01-15 DIAGNOSIS — E66813 Obesity, class 3: Secondary | ICD-10-CM | POA: Diagnosis present

## 2023-01-15 DIAGNOSIS — E11319 Type 2 diabetes mellitus with unspecified diabetic retinopathy without macular edema: Secondary | ICD-10-CM | POA: Diagnosis present

## 2023-01-15 DIAGNOSIS — K8020 Calculus of gallbladder without cholecystitis without obstruction: Secondary | ICD-10-CM | POA: Diagnosis not present

## 2023-01-15 DIAGNOSIS — Z79890 Hormone replacement therapy (postmenopausal): Secondary | ICD-10-CM

## 2023-01-15 DIAGNOSIS — Z87891 Personal history of nicotine dependence: Secondary | ICD-10-CM

## 2023-01-15 DIAGNOSIS — E1151 Type 2 diabetes mellitus with diabetic peripheral angiopathy without gangrene: Secondary | ICD-10-CM | POA: Diagnosis not present

## 2023-01-15 DIAGNOSIS — I132 Hypertensive heart and chronic kidney disease with heart failure and with stage 5 chronic kidney disease, or end stage renal disease: Secondary | ICD-10-CM | POA: Diagnosis not present

## 2023-01-15 DIAGNOSIS — R739 Hyperglycemia, unspecified: Secondary | ICD-10-CM

## 2023-01-15 DIAGNOSIS — Z955 Presence of coronary angioplasty implant and graft: Secondary | ICD-10-CM

## 2023-01-15 DIAGNOSIS — N250 Renal osteodystrophy: Secondary | ICD-10-CM | POA: Diagnosis not present

## 2023-01-15 DIAGNOSIS — I252 Old myocardial infarction: Secondary | ICD-10-CM

## 2023-01-15 DIAGNOSIS — F419 Anxiety disorder, unspecified: Secondary | ICD-10-CM | POA: Diagnosis present

## 2023-01-15 DIAGNOSIS — E785 Hyperlipidemia, unspecified: Secondary | ICD-10-CM | POA: Diagnosis present

## 2023-01-15 DIAGNOSIS — R609 Edema, unspecified: Secondary | ICD-10-CM | POA: Diagnosis not present

## 2023-01-15 DIAGNOSIS — Z885 Allergy status to narcotic agent status: Secondary | ICD-10-CM

## 2023-01-15 DIAGNOSIS — I7389 Other specified peripheral vascular diseases: Principal | ICD-10-CM

## 2023-01-15 DIAGNOSIS — E871 Hypo-osmolality and hyponatremia: Secondary | ICD-10-CM | POA: Diagnosis present

## 2023-01-15 DIAGNOSIS — Z79899 Other long term (current) drug therapy: Secondary | ICD-10-CM

## 2023-01-15 DIAGNOSIS — I10 Essential (primary) hypertension: Secondary | ICD-10-CM | POA: Diagnosis present

## 2023-01-15 DIAGNOSIS — I739 Peripheral vascular disease, unspecified: Secondary | ICD-10-CM | POA: Diagnosis present

## 2023-01-15 DIAGNOSIS — I5032 Chronic diastolic (congestive) heart failure: Secondary | ICD-10-CM | POA: Diagnosis not present

## 2023-01-15 DIAGNOSIS — I5022 Chronic systolic (congestive) heart failure: Secondary | ICD-10-CM | POA: Diagnosis present

## 2023-01-15 DIAGNOSIS — R1031 Right lower quadrant pain: Secondary | ICD-10-CM | POA: Diagnosis not present

## 2023-01-15 DIAGNOSIS — Z9842 Cataract extraction status, left eye: Secondary | ICD-10-CM

## 2023-01-15 DIAGNOSIS — R197 Diarrhea, unspecified: Secondary | ICD-10-CM | POA: Diagnosis not present

## 2023-01-15 DIAGNOSIS — Z9841 Cataract extraction status, right eye: Secondary | ICD-10-CM

## 2023-01-15 LAB — COMPREHENSIVE METABOLIC PANEL
ALT: 23 U/L (ref 0–44)
AST: 19 U/L (ref 15–41)
Albumin: 3 g/dL — ABNORMAL LOW (ref 3.5–5.0)
Alkaline Phosphatase: 300 U/L — ABNORMAL HIGH (ref 38–126)
Anion gap: 19 — ABNORMAL HIGH (ref 5–15)
BUN: 52 mg/dL — ABNORMAL HIGH (ref 8–23)
CO2: 20 mmol/L — ABNORMAL LOW (ref 22–32)
Calcium: 9 mg/dL (ref 8.9–10.3)
Chloride: 85 mmol/L — ABNORMAL LOW (ref 98–111)
Creatinine, Ser: 4.27 mg/dL — ABNORMAL HIGH (ref 0.44–1.00)
GFR, Estimated: 10 mL/min — ABNORMAL LOW (ref >60)
Glucose, Bld: 687 mg/dL (ref 70–99)
Potassium: 4 mmol/L (ref 3.5–5.1)
Sodium: 124 mmol/L — ABNORMAL LOW (ref 135–145)
Total Bilirubin: 0.8 mg/dL (ref 0.3–1.2)
Total Protein: 7.4 g/dL (ref 6.5–8.1)

## 2023-01-15 LAB — BASIC METABOLIC PANEL
Anion gap: 15 (ref 5–15)
Anion gap: 18 — ABNORMAL HIGH (ref 5–15)
BUN: 54 mg/dL — ABNORMAL HIGH (ref 8–23)
BUN: 57 mg/dL — ABNORMAL HIGH (ref 8–23)
CO2: 23 mmol/L (ref 22–32)
CO2: 19 mmol/L — ABNORMAL LOW (ref 22–32)
Calcium: 8.7 mg/dL — ABNORMAL LOW (ref 8.9–10.3)
Calcium: 8.9 mg/dL (ref 8.9–10.3)
Chloride: 86 mmol/L — ABNORMAL LOW (ref 98–111)
Chloride: 90 mmol/L — ABNORMAL LOW (ref 98–111)
Creatinine, Ser: 4.34 mg/dL — ABNORMAL HIGH (ref 0.44–1.00)
Creatinine, Ser: 4.3 mg/dL — ABNORMAL HIGH (ref 0.44–1.00)
GFR, Estimated: 10 mL/min — ABNORMAL LOW (ref >60)
GFR, Estimated: 10 mL/min — ABNORMAL LOW (ref >60)
Glucose, Bld: 692 mg/dL (ref 70–99)
Glucose, Bld: 407 mg/dL — ABNORMAL HIGH (ref 70–99)
Potassium: 4 mmol/L (ref 3.5–5.1)
Potassium: 3.7 mmol/L (ref 3.5–5.1)
Sodium: 124 mmol/L — ABNORMAL LOW (ref 135–145)
Sodium: 127 mmol/L — ABNORMAL LOW (ref 135–145)

## 2023-01-15 LAB — GLUCOSE, CAPILLARY
Glucose-Capillary: 539 mg/dL (ref 70–99)
Glucose-Capillary: 397 mg/dL — ABNORMAL HIGH (ref 70–99)
Glucose-Capillary: 228 mg/dL — ABNORMAL HIGH (ref 70–99)
Glucose-Capillary: 250 mg/dL — ABNORMAL HIGH (ref 70–99)
Glucose-Capillary: 191 mg/dL — ABNORMAL HIGH (ref 70–99)
Glucose-Capillary: 151 mg/dL — ABNORMAL HIGH (ref 70–99)
Glucose-Capillary: 107 mg/dL — ABNORMAL HIGH (ref 70–99)
Glucose-Capillary: 107 mg/dL — ABNORMAL HIGH (ref 70–99)
Glucose-Capillary: 138 mg/dL — ABNORMAL HIGH (ref 70–99)

## 2023-01-15 LAB — BLOOD GAS, VENOUS
Acid-base deficit: 0.5 mmol/L (ref 0.0–2.0)
Bicarbonate: 26.3 mmol/L (ref 20.0–28.0)
Drawn by: 3369
O2 Saturation: 42.8 %
Patient temperature: 37
pCO2, Ven: 51 mmHg (ref 44–60)
pH, Ven: 7.32 (ref 7.25–7.43)
pO2, Ven: <31 mmHg — CL (ref 32–45)

## 2023-01-15 LAB — PHOSPHORUS: Phosphorus: 5.2 mg/dL — ABNORMAL HIGH (ref 2.5–4.6)

## 2023-01-15 LAB — CBG MONITORING, ED
Glucose-Capillary: >600 mg/dL (ref 70–99)
Glucose-Capillary: >600 mg/dL (ref 70–99)
Glucose-Capillary: 576 mg/dL (ref 70–99)

## 2023-01-15 LAB — CBC
HCT: 35.9 % — ABNORMAL LOW (ref 36.0–46.0)
Hemoglobin: 10.6 g/dL — ABNORMAL LOW (ref 12.0–15.0)
MCH: 22.3 pg — ABNORMAL LOW (ref 26.0–34.0)
MCHC: 29.5 g/dL — ABNORMAL LOW (ref 30.0–36.0)
MCV: 75.6 fL — ABNORMAL LOW (ref 80.0–100.0)
Platelets: 277 10*3/uL (ref 150–400)
RBC: 4.75 MIL/uL (ref 3.87–5.11)
RDW: 17.5 % — ABNORMAL HIGH (ref 11.5–15.5)
WBC: 8.6 10*3/uL (ref 4.0–10.5)
nRBC: 0 % (ref 0.0–0.2)

## 2023-01-15 LAB — BETA-HYDROXYBUTYRIC ACID: Beta-Hydroxybutyric Acid: 1.85 mmol/L — ABNORMAL HIGH (ref 0.05–0.27)

## 2023-01-15 LAB — LIPASE, BLOOD: Lipase: 29 U/L (ref 11–51)

## 2023-01-15 LAB — TSH: TSH: 4.435 u[IU]/mL (ref 0.350–4.500)

## 2023-01-15 LAB — HEPATITIS B SURFACE ANTIGEN: Hepatitis B Surface Ag: NONREACTIVE

## 2023-01-15 MED ORDER — INSULIN ASPART 100 UNIT/ML IJ SOLN
10.0000 [IU] | Freq: Once | INTRAMUSCULAR | Status: AC
  Administered 2023-01-15: 10 [IU] via SUBCUTANEOUS

## 2023-01-15 MED ORDER — PANTOPRAZOLE SODIUM 40 MG PO TBEC
40.0000 mg | DELAYED_RELEASE_TABLET | Freq: Two times a day (BID) | ORAL | Status: DC
  Administered 2023-01-16 – 2023-01-17 (×3): 40 mg via ORAL
  Filled 2023-01-15 (×3): qty 1

## 2023-01-15 MED ORDER — VENLAFAXINE HCL ER 75 MG PO CP24
75.0000 mg | ORAL_CAPSULE | Freq: Every day | ORAL | Status: DC
  Administered 2023-01-16 – 2023-01-17 (×2): 75 mg via ORAL
  Filled 2023-01-15 (×2): qty 1

## 2023-01-15 MED ORDER — ATORVASTATIN CALCIUM 80 MG PO TABS
80.0000 mg | ORAL_TABLET | Freq: Every day | ORAL | Status: DC
  Administered 2023-01-16 (×2): 80 mg via ORAL
  Filled 2023-01-15 (×2): qty 1

## 2023-01-15 MED ORDER — LACTATED RINGERS IV BOLUS
1000.0000 mL | Freq: Once | INTRAVENOUS | Status: AC
  Administered 2023-01-15: 1000 mL via INTRAVENOUS

## 2023-01-15 MED ORDER — HEPARIN SODIUM (PORCINE) 1000 UNIT/ML DIALYSIS
2000.0000 [IU] | INTRAMUSCULAR | Status: AC | PRN
  Administered 2023-01-17: 4000 [IU] via INTRAVENOUS_CENTRAL
  Filled 2023-01-15 (×2): qty 2

## 2023-01-15 MED ORDER — CLONAZEPAM 0.125 MG PO TBDP
0.5000 mg | ORAL_TABLET | Freq: Two times a day (BID) | ORAL | Status: DC
  Administered 2023-01-16 – 2023-01-17 (×3): 0.5 mg via ORAL
  Filled 2023-01-15 (×4): qty 4

## 2023-01-15 MED ORDER — AMIODARONE HCL 200 MG PO TABS
200.0000 mg | ORAL_TABLET | Freq: Every day | ORAL | Status: DC
  Administered 2023-01-15 – 2023-01-17 (×3): 200 mg via ORAL
  Filled 2023-01-15 (×3): qty 1

## 2023-01-15 MED ORDER — IOHEXOL 350 MG/ML SOLN
60.0000 mL | Freq: Once | INTRAVENOUS | Status: AC | PRN
  Administered 2023-01-15: 60 mL via INTRAVENOUS

## 2023-01-15 MED ORDER — CARVEDILOL 6.25 MG PO TABS
6.2500 mg | ORAL_TABLET | Freq: Two times a day (BID) | ORAL | Status: DC
  Administered 2023-01-15 – 2023-01-16 (×3): 6.25 mg via ORAL
  Filled 2023-01-15 (×3): qty 1

## 2023-01-15 MED ORDER — INSULIN REGULAR(HUMAN) IN NACL 100-0.9 UT/100ML-% IV SOLN
INTRAVENOUS | Status: DC
  Administered 2023-01-15: 10.5 [IU]/h via INTRAVENOUS
  Filled 2023-01-15: qty 100

## 2023-01-15 MED ORDER — INSULIN ASPART 100 UNIT/ML IJ SOLN
0.0000 [IU] | Freq: Three times a day (TID) | INTRAMUSCULAR | Status: DC
  Administered 2023-01-16: 5 [IU] via SUBCUTANEOUS
  Administered 2023-01-16: 1 [IU] via SUBCUTANEOUS
  Administered 2023-01-16: 4 [IU] via SUBCUTANEOUS

## 2023-01-15 MED ORDER — SODIUM CHLORIDE 0.9 % IV SOLN
2.0000 g | INTRAVENOUS | Status: DC
  Administered 2023-01-15 – 2023-01-16 (×2): 2 g via INTRAVENOUS
  Filled 2023-01-15 (×2): qty 20

## 2023-01-15 MED ORDER — HEPARIN SODIUM (PORCINE) 1000 UNIT/ML IJ SOLN
1000.0000 [IU] | INTRAMUSCULAR | Status: DC | PRN
  Administered 2023-01-16: 1000 [IU]
  Administered 2023-01-17: 3800 [IU]
  Filled 2023-01-15 (×5): qty 1

## 2023-01-15 MED ORDER — PROCHLORPERAZINE EDISYLATE 10 MG/2ML IJ SOLN
10.0000 mg | Freq: Once | INTRAMUSCULAR | Status: AC
  Administered 2023-01-15: 10 mg via INTRAVENOUS
  Filled 2023-01-15: qty 2

## 2023-01-15 MED ORDER — CHLORHEXIDINE GLUCONATE CLOTH 2 % EX PADS
6.0000 | MEDICATED_PAD | Freq: Every day | CUTANEOUS | Status: DC
  Administered 2023-01-16 – 2023-01-17 (×2): 6 via TOPICAL

## 2023-01-15 MED ORDER — DIPHENHYDRAMINE HCL 50 MG/ML IJ SOLN
25.0000 mg | Freq: Once | INTRAMUSCULAR | Status: AC
  Administered 2023-01-15: 25 mg via INTRAVENOUS
  Filled 2023-01-15: qty 1

## 2023-01-15 MED ORDER — MIDODRINE HCL 5 MG PO TABS
10.0000 mg | ORAL_TABLET | ORAL | Status: DC
  Administered 2023-01-17: 10 mg via ORAL
  Filled 2023-01-15: qty 2

## 2023-01-15 MED ORDER — APIXABAN 5 MG PO TABS
5.0000 mg | ORAL_TABLET | Freq: Two times a day (BID) | ORAL | Status: DC
  Administered 2023-01-15 – 2023-01-17 (×5): 5 mg via ORAL
  Filled 2023-01-15 (×5): qty 1

## 2023-01-15 MED ORDER — METOCLOPRAMIDE HCL 5 MG PO TABS
5.0000 mg | ORAL_TABLET | Freq: Three times a day (TID) | ORAL | Status: DC
  Administered 2023-01-16 – 2023-01-17 (×5): 5 mg via ORAL
  Filled 2023-01-15 (×5): qty 1

## 2023-01-15 MED ORDER — HEPARIN SODIUM (PORCINE) 1000 UNIT/ML DIALYSIS
4000.0000 [IU] | Freq: Once | INTRAMUSCULAR | Status: AC
  Administered 2023-01-15: 4000 [IU] via INTRAVENOUS_CENTRAL
  Filled 2023-01-15: qty 4

## 2023-01-15 MED ORDER — METHOCARBAMOL 500 MG PO TABS: 750.0000 mg | ORAL_TABLET | Freq: Four times a day (QID) | ORAL | Status: DC | PRN

## 2023-01-15 MED ORDER — CALCITRIOL 0.5 MCG PO CAPS
0.7500 ug | ORAL_CAPSULE | ORAL | Status: DC
  Administered 2023-01-15 – 2023-01-17 (×2): 0.75 ug via ORAL
  Filled 2023-01-15 (×2): qty 1

## 2023-01-15 MED ORDER — DEXTROSE 50 % IV SOLN: 0.0000 mL | INTRAVENOUS | Status: DC | PRN

## 2023-01-15 MED ORDER — HYDRALAZINE HCL 20 MG/ML IJ SOLN: 10.0000 mg | INTRAMUSCULAR | Status: DC | PRN

## 2023-01-15 MED ORDER — INSULIN ASPART 100 UNIT/ML IJ SOLN
0.0000 [IU] | Freq: Every day | INTRAMUSCULAR | Status: DC
  Administered 2023-01-16: 4 [IU] via SUBCUTANEOUS

## 2023-01-15 MED ORDER — FOLIC ACID 1 MG PO TABS
1.0000 mg | ORAL_TABLET | Freq: Every day | ORAL | Status: DC
  Administered 2023-01-15 – 2023-01-17 (×3): 1 mg via ORAL
  Filled 2023-01-15 (×3): qty 1

## 2023-01-15 MED ORDER — ONDANSETRON HCL 4 MG/2ML IJ SOLN: 4.0000 mg | Freq: Once | INTRAMUSCULAR | Status: DC

## 2023-01-15 MED ORDER — SEVELAMER CARBONATE 800 MG PO TABS
1600.0000 mg | ORAL_TABLET | Freq: Three times a day (TID) | ORAL | Status: DC
  Administered 2023-01-16 – 2023-01-17 (×5): 1600 mg via ORAL
  Filled 2023-01-15 (×5): qty 2

## 2023-01-15 MED ORDER — DARBEPOETIN ALFA 100 MCG/0.5ML IJ SOSY
100.0000 ug | PREFILLED_SYRINGE | INTRAMUSCULAR | Status: DC
  Filled 2023-01-15: qty 0.5

## 2023-01-15 MED ORDER — LEVOTHYROXINE SODIUM 75 MCG PO TABS
75.0000 ug | ORAL_TABLET | Freq: Every day | ORAL | Status: DC
  Administered 2023-01-15 – 2023-01-17 (×3): 75 ug via ORAL
  Filled 2023-01-15 (×3): qty 1

## 2023-01-15 MED ORDER — ONDANSETRON HCL 4 MG/2ML IJ SOLN
INTRAMUSCULAR | Status: AC
  Filled 2023-01-15: qty 2

## 2023-01-15 MED ORDER — INSULIN GLARGINE-YFGN 100 UNIT/ML ~~LOC~~ SOLN
8.0000 [IU] | SUBCUTANEOUS | Status: DC
  Administered 2023-01-15: 8 [IU] via SUBCUTANEOUS
  Filled 2023-01-15 (×2): qty 0.08

## 2023-01-15 MED ORDER — RAMELTEON 8 MG PO TABS
8.0000 mg | ORAL_TABLET | Freq: Every day | ORAL | Status: DC
  Administered 2023-01-16: 8 mg via ORAL
  Filled 2023-01-15 (×3): qty 1

## 2023-01-15 MED ORDER — TRIMETHOBENZAMIDE HCL 100 MG/ML IM SOLN: 200.0000 mg | Freq: Four times a day (QID) | INTRAMUSCULAR | Status: DC | PRN

## 2023-01-15 MED ORDER — METRONIDAZOLE 500 MG/100ML IV SOLN
500.0000 mg | Freq: Two times a day (BID) | INTRAVENOUS | Status: DC
  Administered 2023-01-15 – 2023-01-17 (×4): 500 mg via INTRAVENOUS
  Filled 2023-01-15 (×4): qty 100

## 2023-01-15 NOTE — ED Triage Notes (Signed)
Patient reports pain across lower abdomen with emesis and diarrhea onset yesterday afternoon .

## 2023-01-15 NOTE — Progress Notes (Signed)
Pt receives out-pt HD at [facility] on TTS 11:30 am chair time. Will assist as needed.   Olivia Canter Renal Navigator [phone]

## 2023-01-15 NOTE — ED Notes (Signed)
ED TO INPATIENT HANDOFF REPORT  ED Nurse Name and Phone #:  [name] [phone]  S Name/Age/Gender Susan [name] 72 y.o. female Room/Bed: 020C/020C  Code Status   Code Status: Prior  Home/SNF/Other Home Patient oriented to: self, place, time, and situation Is this baseline? Yes   Triage Complete: Triage complete  Chief Complaint Emesis; diarrhea  Triage Note Patient reports pain across lower abdomen with emesis and diarrhea onset yesterday afternoon .    Allergies Allergies  Allergen Reactions   Cephalexin Diarrhea and Other (See Comments)   Codeine Nausea And Vomiting and Other (See Comments)    Level of Care/Admitting Diagnosis ED Disposition     ED Disposition  Admit   Condition  --   Comment  The patient appears reasonably stabilized for admission considering the current resources, flow, and capabilities available in the ED at this time, and I doubt any other [facility] requiring further screening and/or treatment in the ED prior to admission is  present.          B Medical/Surgery History Past Medical History:  Diagnosis Date   Anemia    hx   Anxiety    Arthritis    "generalized" (03/15/2014)   CAD (coronary artery disease)    MI in 2000 - MI  2007 - treated bare metal stent (no nuclear since then as 9/11)   Carotid artery disease (HCC)    Chronic diastolic heart failure (HCC)    a) ECHO (08/2013) EF 55-60% and RV function nl b) RHC (08/2013) RA 4, RV 30/5/7, PA 25/10 (16), PCWP 7, Fick CO/CI 6.3/2.7, PVR 1.5 WU, PA 61 and 66%   Daily headache    "~ every other day; since I fell in June" (03/15/2014)   Depression    Diabetic retinopathy (HCC)    Dyslipidemia    ESRD (end stage renal disease) (HCC)    Dialysis on Tues Thurs Sat   Exertional shortness of breath    History of kidney stones    HTN (hypertension)    Hypothyroidism    Obesity    Osteoarthritis    PAF (paroxysmal atrial fibrillation) (HCC)    Peripheral neuropathy    bilateral feet/hands    PONV (postoperative nausea and vomiting)    RBBB (right bundle branch block)    Old   Stroke (HCC)    mini strokes   Type II diabetes mellitus (HCC)    Type II, [name]. patient has omnipod insulin pump with Novolin R Insulin   Past Surgical History:  Procedure Laterality Date   A/V FISTULAGRAM Left 11/07/2022   Procedure: A/V Fistulagram;  Surgeon: Leonie Douglas, MD;  [facility] IN[facility] LAB;  Service: Cardiovascular;  Laterality: Left;   ABDOMINAL HYSTERECTOMY  1980's   AMPUTATION Right 02/24/2018   Procedure: RIGHT FOOT GREAT TOE AND 2ND TOE AMPUTATION;  Surgeon: Nadara Mustard, MD;  Location: [facility] OR;  Service: Orthopedics;  Laterality: Right;   AMPUTATION Right 04/30/2018   Procedure: RIGHT TRANSMETATARSAL AMPUTATION;  Surgeon: Nadara Mustard, MD;  Location: [facility] OR;  Service: Orthopedics;  Laterality: Right;   AMPUTATION Right 05/02/2022   Procedure: RIGHT BELOW KNEE AMPUTATION;  Surgeon: Nadara Mustard, MD;  Location: [facility] OR;  Service: Orthopedics;  Laterality: Right;   APPLICATION OF WOUND VAC Right 06/13/2022   Procedure: APPLICATION OF WOUND VAC;  Surgeon: Nadara Mustard, MD;  Location: [facility] OR;  Service: Orthopedics;  Laterality: Right;   APPLICATION OF WOUND VAC  Left 11/14/2022   Procedure: APPLICATION OF WOUND VAC;  Surgeon: Nadara Mustard, MD;  Location: [facility] OR;  Service: Orthopedics;  Laterality: Left;   AV FISTULA PLACEMENT Left 04/02/2022   Procedure: LEFT ARM ARTERIOVENOUS (AV) FISTULA CREATION;  Surgeon: Nada Libman, MD;  Location: [facility] OR;  Service: Vascular;  Laterality: Left;  PERIPHERAL NERVE BLOCK   BASCILIC VEIN TRANSPOSITION Left 07/31/2022   Procedure: LEFT ARM SECOND STAGE BASILIC VEIN TRANSPOSITION;  Surgeon: Nada Libman, MD;  Location[facility] OR;  Service: Vascular;  Laterality: Left;   BIOPSY  05/27/2020   Procedure: BIOPSY;  Surgeon: Lanelle Bal, DO;  Location: [facility] ENDO SUITE;  Service: Endoscopy;;   CATARACT EXTRACTION, BILATERAL  Bilateral ?2013   COLONOSCOPY W/ POLYPECTOMY     COLONOSCOPY WITH PROPOFOL N/A 03/13/2019   Procedure: COLONOSCOPY WITH PROPOFOL;  Surgeon: Beverley Fiedler, MD;  Location: [facility] ENDOSCOPY;  Service: Gastroenterology;  Laterality: N/A;   CORONARY ANGIOPLASTY WITH STENT PLACEMENT  1999; 2007   "1 + 1"   ERCP N/A 02/03/2022   Procedure: ENDOSCOPIC RETROGRADE CHOLANGIOPANCREATOGRAPHY (ERCP);  Surgeon: Jeani Hawking, MD;  Location: [facility] ENDOSCOPY;  Service: Gastroenterology;  Laterality: N/A;   ESOPHAGOGASTRODUODENOSCOPY (EGD) WITH PROPOFOL N/A 03/13/2019   Procedure: ESOPHAGOGASTRODUODENOSCOPY (EGD) WITH PROPOFOL;  Surgeon: Beverley Fiedler, MD;  Location: [facility] ENDOSCOPY;  Service: Gastroenterology;  Laterality: N/A;   ESOPHAGOGASTRODUODENOSCOPY (EGD) WITH PROPOFOL N/A 05/27/2020   Procedure: ESOPHAGOGASTRODUODENOSCOPY (EGD) WITH PROPOFOL;  Surgeon: Lanelle Bal, DO;  [facility];  Service: Endoscopy;  Laterality: N/A;   ESOPHAGOGASTRODUODENOSCOPY (EGD) WITH PROPOFOL N/A 09/03/2022   Procedure: ESOPHAGOGASTRODUODENOSCOPY (EGD) WITH PROPOFOL;  Surgeon: Jeani Hawking, MD;  Location: [facility] ENDOSCOPY;  Service: Gastroenterology;  Laterality: N/A;   EYE SURGERY Bilateral    lazer   FLEXIBLE SIGMOIDOSCOPY N/A 05/23/2022   Procedure: FLEXIBLE SIGMOIDOSCOPY;  Surgeon: Jeani Hawking, MD;  Location: [facility] ENDOSCOPY;  Service: Gastroenterology;  Laterality: N/A;   FLEXIBLE SIGMOIDOSCOPY N/A 05/24/2022   Procedure: FLEXIBLE SIGMOIDOSCOPY;  Surgeon: Imogene Burn, MD;  Location: [name] ENDOSCOPY;  Service: Gastroenterology;  Laterality: N/A;   FLEXIBLE SIGMOIDOSCOPY N/A 09/03/2022   Procedure: FLEXIBLE SIGMOIDOSCOPY;  Surgeon: Jeani Hawking, MD;  Location: [facility] ENDOSCOPY;  Service: Gastroenterology;  Laterality: N/A;   HEMOSTASIS CLIP PLACEMENT  03/13/2019   Procedure: HEMOSTASIS CLIP PLACEMENT;  Surgeon: Beverley Fiedler, MD;  Location: [facility] ENDOSCOPY;  Service: Gastroenterology;;   HEMOSTASIS CLIP PLACEMENT  05/23/2022    Procedure: HEMOSTASIS CLIP PLACEMENT;  Surgeon: Jeani Hawking, MD;  Location: [facility] ENDOSCOPY;  Service: Gastroenterology;;   HEMOSTASIS CONTROL  05/24/2022   Procedure: HEMOSTASIS CONTROL;  Surgeon: Imogene Burn, MD;  Location: [facility] ENDOSCOPY;  Service: Gastroenterology;;   HOT HEMOSTASIS N/A 05/23/2022   Procedure: HOT HEMOSTASIS (ARGON PLASMA COAGULATION/BICAP);  Surgeon: Jeani Hawking, MD;  Location: [facility] ENDOSCOPY;  Service: Gastroenterology;  Laterality: N/A;   I & D EXTREMITY Left 05/05/2022   Procedure: IRRIGATION AND DEBRIDEMENT LEFT ARM AV FISTULA;  Surgeon: Cephus Shelling, MD;  Location: [facility] OR;  Service: Vascular;  Laterality: Left;   I & D EXTREMITY N/A 11/14/2022   Procedure: IRRIGATION AND DEBRIDEMENT OF LOWER EXTREMITY WOUND;  Surgeon: Nadara Mustard, MD;  [facility] OR;  Service: Orthopedics;  Laterality: N/A;   INSERTION OF DIALYSIS CATHETER Right 04/02/2022   Procedure: INSERTION OF TUNNELED DIALYSIS CATHETER;  Surgeon: Nada Libman, MD;  Location: [facility] OR;  Service: Vascular;  Laterality: Right;   KNEE ARTHROSCOPY Left 10/25/2006   POLYPECTOMY  03/13/2019  Procedure: POLYPECTOMY;  Surgeon: Beverley Fiedler, MD;  Location: [facility] ENDOSCOPY;  Service: Gastroenterology;;   REMOVAL OF STONES  02/03/2022   Procedure: REMOVAL OF STONES;  Surgeon: Jeani Hawking, MD;  Location: [facility] ENDOSCOPY;  Service: Gastroenterology;;   REVISON OF ARTERIOVENOUS FISTULA Left 08/20/2022   Procedure: REVISON OF LEFT ARM ARTERIOVENOUS FISTULA;  Surgeon: Nada Libman, MD;  [facility];  Service: Vascular;  Laterality: Left;   RIGHT HEART CATH N/A 07/24/2017   Procedure: RIGHT HEART CATH;  Surgeon: Dolores Patty, MD;  [facility] IN[facility];  Service: Cardiovascular;  Laterality: N/A;   RIGHT HEART CATHETERIZATION N/A 09/22/2013   Procedure: RIGHT HEART CATH;  Surgeon: Dolores Patty, MD;  Location: [facility] CATH LAB;  Service: Cardiovascular;  Laterality: N/A;   SHOULDER ARTHROSCOPY WITH OPEN  ROTATOR CUFF REPAIR Right 03/14/2014   Procedure: RIGHT SHOULDER ARTHROSCOPY WITH BICEPS RELEASE, OPEN SUBSCAPULA REPAIR, OPEN SUPRASPINATUS REPAIR.;  Surgeon: Cammy Copa, MD;  Location: [facility] OR;  Service: Orthopedics;  Laterality: Right;   SPHINCTEROTOMY  02/03/2022   Procedure: SPHINCTEROTOMY;  Surgeon: Jeani Hawking, MD;  Location: [facility] ENDOSCOPY;  Service: Gastroenterology;;   STUMP REVISION Right 06/13/2022   Procedure: REVISION RIGHT BELOW KNEE AMPUTATION;  Surgeon: Nadara Mustard, MD;  Location: [facility] OR;  Service: Orthopedics;  Laterality: Right;   TEE WITHOUT CARDIOVERSION N/A 02/04/2022   Procedure: TRANSESOPHAGEAL ECHOCARDIOGRAM (TEE);  Surgeon: Dolores Patty, MD;  Location: [facility] ENDOSCOPY;  Service: Cardiovascular;  Laterality: N/A;   THROMBECTOMY W/ EMBOLECTOMY Left 08/20/2022   Procedure: THROMBECTOMY OF LEFT ARM ARTERIOVENOUS FISTULA;  Surgeon: Nada Libman, MD;  [facility] OR;  Service: Vascular;  Laterality: Left;   TOE AMPUTATION Right 02/24/2018   GREAT TOE AND 2ND TOE AMPUTATION   TUBAL LIGATION  1970's     A IV Location/Drains/Wounds Patient Lines/Drains/Airways Status     Active Line/Drains/Airways     Name Placement date Placement time Site Days   Peripheral IV 01/15/23 20 G 1" Right Antecubital 01/15/23  0607  Antecubital  less than 1   Fistula / Graft Left Upper arm Arteriovenous fistula 07/31/22  0942  Upper arm  168   Hemodialysis Catheter Right Subclavian 06/06/22  --  Subclavian  223   Pressure Injury 08/27/22 Coccyx Mid Stage 2 -  Partial thickness loss of dermis presenting as a shallow open injury with a red, pink wound bed without slough. 73mmx5mmx0 red center unattached edges 08/27/22  1000  -- 141   Wound / Incision (Open or Dehisced) 08/27/22 Irritant Dermatitis (Moisture Associated Skin Damage) Right 08/27/22  1000  --  141   Wound / Incision (Open or Dehisced) 11/10/22 Diabetic ulcer Pretibial Left;Lateral;Lower 11/10/22  2300  Pretibial  66             Intake/Output Last 24 hours No intake or output data in the 24 hours ending 01/15/23 0941  Labs/Imaging Results for orders placed or performed during the hospital encounter of 01/15/23 (from the past 48 hour(s))  Lipase, blood     Status: None   Collection Time: 01/15/23  5:13 AM  Result Value Ref Range   Lipase 29 11 - 51 U/L    Comment: Performed at [facility], [address] [place], [place] [address]  Comprehensive metabolic panel     Status: Abnormal   Collection Time: 01/15/23  5:13 AM  Result Value Ref Range   Sodium 124 (L) 135 - 145 mmol/L   Potassium 4.0  3.5 - 5.1 mmol/L   Chloride 85 (L) 98 - 111 mmol/L   CO2 20 (L) 22 - 32 mmol/L   Glucose, Bld 687 (HH) 70 - 99 mg/dL    Comment: CRITICAL RESULT CALLED TO, READ BACK BY AND VERIFIED WITH Benjaman Lobe, RN. [phone] 01/15/23. LPAIT Glucose reference range applies only to samples taken after fasting for at least 8 hours.    BUN 52 (H) 8 - 23 mg/dL   Creatinine, Ser 6.21 (H) 0.44 - 1.00 mg/dL   Calcium 9.0 8.9 - 30.8 mg/dL   Total Protein 7.4 6.5 - 8.1 g/dL   Albumin 3.0 (L) 3.5 - 5.0 g/dL   AST 19 15 - 41 U/L   ALT 23 0 - 44 U/L   Alkaline Phosphatase 300 (H) 38 - 126 U/L   Total Bilirubin 0.8 0.3 - 1.2 mg/dL   GFR, Estimated 10 (L) >60 mL/min    Comment: (NOTE) Calculated using the CKD-EPI Creatinine Equation (2021)    Anion gap 19 (H) 5 - 15    Comment: Performed at [facility], [address] [place], [place] [address]  CBC     Status: Abnormal   Collection Time: 01/15/23  5:13 AM  Result Value Ref Range   WBC 8.6 4.0 - 10.5 K/uL   RBC 4.75 3.87 - 5.11 MIL/uL   Hemoglobin 10.6 (L) 12.0 - 15.0 g/dL   HCT 69.6 (L) 29.5 - 28.4 %   MCV 75.6 (L) 80.0 - 100.0 fL   MCH 22.3 (L) 26.0 - 34.0 pg   MCHC 29.5 (L) 30.0 - 36.0 g/dL   RDW 13.2 (H) 44.0 - 10.2 %   Platelets 277 150 - 400 K/uL   nRBC 0.0 0.0 - 0.2 %    Comment: Performed at [facility], [address] [place], [place]  [address]  Blood gas, venous (at [facility] and [facility])     Status: Abnormal   Collection Time: 01/15/23  6:00 AM  Result Value Ref Range   pH, Ven 7.32 7.25 - 7.43   pCO2, Ven 51 44 - 60 mmHg   pO2, Ven <31 (LL) 32 - 45 mmHg    Comment: CRITICAL RESULT CALLED TO, READ BACK BY AND VERIFIED WITH: TUCKER,S. RN @0626  01/15/23 SATRAINR    Bicarbonate 26.3 20.0 - 28.0 mmol/L   Acid-base deficit 0.5 0.0 - 2.0 mmol/L   O2 Saturation 42.8 %   Patient temperature 37.0    Drawn by 6440     Comment: Performed at [facility], [address], [place], [place] [address]  Beta-hydroxybutyric acid     Status: Abnormal   Collection Time: 01/15/23  6:01 AM  Result Value Ref Range   Beta-Hydroxybutyric Acid 1.85 (H) 0.05 - 0.27 mmol/L    Comment: Performed at [facility], [address], [place], [place] [address]  POC CBG, ED     Status: Abnormal   Collection Time: 01/15/23  7:46 AM  Result Value Ref Range   Glucose-Capillary >600 (HH) 70 - 99 mg/dL    Comment: Glucose reference range applies only to samples taken after fasting for at least 8 hours.  Basic metabolic panel     Status: Abnormal   Collection Time: 01/15/23  8:13 AM  Result Value Ref Range   Sodium 124 (L) 135 - 145 mmol/L   Potassium 4.0 3.5 - 5.1 mmol/L   Chloride 86 (L) 98 - 111 mmol/L   CO2 23 22 -  32 mmol/L   Glucose, Bld 692 (HH) 70 - 99 mg/dL    Comment: CRITICAL RESULT CALLED TO, READ BACK BY AND VERIFIED WITH Rexene Edison MCNEIL, RN [phone] 01/15/23 L. KLAR Glucose reference range applies only to samples taken after fasting for at least 8 hours.    BUN 54 (H) 8 - 23 mg/dL   Creatinine, Ser 1.91 (H) 0.44 - 1.00 mg/dL   Calcium 8.7 (L) 8.9 - 10.3 mg/dL   GFR, Estimated 10 (L) >60 mL/min    Comment: (NOTE) Calculated using the CKD-EPI Creatinine Equation (2021)    Anion gap 15 5 - 15    Comment: Performed at [facility], [address], [place], [place] [address]   *Note: Due to a large number of results and/or encounters for  the requested time period, some results have not been displayed. A complete set of results can be found in Results Review.   CT ABDOMEN PELVIS W CONTRAST  Result Date: 01/15/2023 CLINICAL DATA:  72 year old female with lower abdominal pain, left lower quadrant pain. Vomiting and diarrhea onset yesterday. EXAM: CT ABDOMEN AND PELVIS WITH CONTRAST TECHNIQUE: Multidetector CT imaging of the abdomen and pelvis was performed using the standard protocol following bolus administration of intravenous contrast. RADIATION DOSE REDUCTION: This exam was performed according to the departmental dose-optimization program which includes automated exposure control, adjustment of the mA and/or kV according to patient size and/or use of iterative reconstruction technique. CONTRAST:  60mL OMNIPAQUE IOHEXOL 350 MG/ML SOLN COMPARISON:  CT Abdomen and Pelvis 10/07/2022. FINDINGS: Lower chest: Layering small left pleural effusion has decreased since April but not resolved. And there is a symmetric new layering small right pleural effusion. Cardiomegaly. No pericardial effusion. Lung base atelectasis and septal thickening. No lung base consolidation. Hepatobiliary: Hepatic steatosis with fatty sparing at the gallbladder fossa. Cholelithiasis. No gallbladder inflammation by CT. No bile duct enlargement. Pancreas: Negative. Spleen: Chronic small calcified splenic granulomas. 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 But there is a new posterosuperior spur small splenic pole infarct on series 6, image 59. No perisplenic fluid or inflammation. Chronic splenic scarring at the anterior superior pole is also stable such as from another prior infarct. Adrenals/Urinary Tract: Stable adrenal gland thickening such as due to hyperplasia. Nonobstructed kidneys with renal vascular calcifications and some cortical atrophy. No renal mass. Little renal contrast excretion on the delayed images. Nondilated ureters. Unremarkable  bladder. Stomach/Bowel: Rectum is more decompressed compared to April but there is circumferential and somewhat irregular rectal wall thickening up to 2 cm on series 3, image 72. See also coronal image 27. The distal sigmoid is decompressed, redundant but otherwise normal. Proximal sigmoid with retained stool and diverticulosis. Similar stool and diverticulosis continuing into the distal descending colon. No active inflammation in those segments. Diverticulosis at the splenic flexure and in the transverse colon with no definite inflammation. More decompressed right colon today. Fecalith versus contrast containing appendix remains diminutive as seen on coronal images 51 through 58 and does not appear inflamed although there is regional mesenteric edema which appears dependent, generalized. No dilated small bowel. Stomach and duodenum appear negative. No free air. No free fluid. Vascular/Lymphatic: Diffuse severe calcified atherosclerosis. Aortoiliac calcified atherosclerosis. Normal caliber abdominal aorta. Major arterial structures remain patent. Grossly patent main portal vein. No lymphadenopathy identified. Reproductive: Absent uterus.  Diminutive or absent ovaries. Other: No free fluid in the pelvis. Musculoskeletal: Lower lumbar advanced disc and endplate degeneration with vacuum disc. No acute osseous abnormality identified.  Body wall edema, regressed along the left lateral body wall since April and similar elsewhere. IMPRESSION: 1. Anasarca with small layering pleural effusions and evidence of lung base pulmonary interstitial edema. Body wall edema and left pleural effusion both regressed compared to April. 2. 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 No perisplenic fluid or inflammation. No thrombus identified within the visible aorta but there is severe diffuse calcified atherosclerosis. Aortic Atherosclerosis (ICD10-I70.0). 3. Nonspecific circumferential rectal wall thickening. This most  resembles Proctitis. Artifact due to under-distension or bowel edema due to #1 felt less likely. 4. Some generalized mesenteric edema suspected and related to #1. Some of this in the region of the appendix, but the appendix appears to remain normal. 5. Cholelithiasis without CT evidence of acute cholecystitis. Hepatic steatosis. 6. Evidence of renal insufficiency. Electronically Signed   By: Odessa Fleming M.D.   On: 01/15/2023 07:50    Pending Labs Unresulted Labs (From admission, onward)     Start     Ordered   01/15/23 0529  C Difficile Quick Screen w PCR reflex  (C Difficile quick screen w PCR reflex panel )  Once, for 24 hours,   URGENT       References:    CDiff Information Tool   01/15/23 0528   01/15/23 0456  Urinalysis, Routine w reflex microscopic -Urine, Clean Catch  Once,   URGENT       Question:  Specimen Source  Answer:  Urine, Clean Catch   01/15/23 0455            Vitals/Pain Today's Vitals   01/15/23 0600 01/15/23 0615 01/15/23 0745 01/15/23 0745  BP: (!) 124/91 (!) 192/95 (!) 172/90   Pulse: 80 81 80   Resp:   20   Temp:   97.7 F (36.5 C)   TempSrc:   Oral   SpO2: 100% 96% 97%   PainSc:    8     Isolation Precautions Enteric precautions (UV disinfection)  Medications Medications  lactated ringers bolus 1,000 mL (0 mLs Intravenous Stopped 01/15/23 0941)  prochlorperazine (COMPAZINE) injection 10 mg (10 mg Intravenous Given 01/15/23 0608)  diphenhydrAMINE (BENADRYL) injection 25 mg (25 mg Intravenous Given 01/15/23 0607)  insulin aspart (novoLOG) injection 10 Units (10 Units Subcutaneous Given 01/15/23 0615)  iohexol (OMNIPAQUE) 350 MG/ML injection 60 mL (60 mLs Intravenous Contrast Given 01/15/23 0731)    Mobility manual wheelchair.     Focused Assessments    R Recommendations: See Admitting Provider Note  Report given to:   Additional Notes:

## 2023-01-15 NOTE — ED Provider Notes (Signed)
[facility] Provider Note  CSN: [id number] Arrival date & time: 01/15/23 0440  Chief Complaint(s) Abdominal Pain  HPI Susan [name] is a 72 y.o. female with PMH CAD status post MI, CHF, ESRD on hemodialysis Tuesday Thursday Saturday, HTN, hypothyroidism, paroxysmal A-fib, right BKA who presents Emergency Department for evaluation of abdominal pain nausea and vomiting and headache.  Patient states that yesterday afternoon she ate a large quantity of watermelon and had symptoms of began approximately 30 minutes afterwards with frequent diarrhea, right lower quadrant pain and vomiting.  She denies associated chest pain, shortness of breath, headache, fever or other systemic symptoms.  Of note, her husband is in the room and also ate the same watermelon and has not had any similar symptoms.   Past Medical History Past Medical History:  Diagnosis Date   Anemia    hx   Anxiety    Arthritis    "generalized" (03/15/2014)   CAD (coronary artery disease)    MI in 2000 - MI  2007 - treated bare metal stent (no nuclear since then as 9/11)   Carotid artery disease (HCC)    Chronic diastolic heart failure (HCC)    a) ECHO (08/2013) EF 55-60% and RV function nl b) RHC (08/2013) RA 4, RV 30/5/7, PA 25/10 (16), PCWP 7, Fick CO/CI 6.3/2.7, PVR 1.5 WU, PA 61 and 66%   Daily headache    "~ every other day; since I fell in June" (03/15/2014)   Depression    Diabetic retinopathy (HCC)    Dyslipidemia    ESRD (end stage renal disease) (HCC)    Dialysis on Tues Thurs Sat   Exertional shortness of breath    History of kidney stones    HTN (hypertension)    Hypothyroidism    Obesity    Osteoarthritis    PAF (paroxysmal atrial fibrillation) (HCC)    Peripheral neuropathy    bilateral feet/hands   PONV (postoperative nausea and vomiting)    RBBB (right bundle branch block)    Old   Stroke (HCC)    mini strokes   Type II diabetes mellitus (HCC)    Type  II, [name]. patient has omnipod insulin pump with Novolin R Insulin   Patient Active Problem List   Diagnosis Date Noted   Acute on chronic anemia 11/25/2022   Right below-knee amputee (HCC) 11/20/2022   Cellulitis of left lower extremity 11/14/2022   Maggot infestation 11/12/2022   Complicated wound infection 11/10/2022   Secondary hypercoagulable state (HCC) 09/04/2022   Right sided abdominal pain 08/31/2022   Constipation 06/07/2022   History of Clostridioides difficile colitis 06/06/2022   Below-knee amputation of right lower extremity (HCC) 06/06/2022   Diverticulitis 06/05/2022   Stercoral colitis 06/05/2022   C. 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) 04/07/2022   Allergy, unspecified, initial encounter 04/07/2022   Dependence on renal dialysis (HCC) 04/07/2022   Gout due to renal impairment, unspecified  site 04/07/2022   Hypertensive heart and chronic kidney disease with heart failure and with stage 5 chronic kidney disease, or end stage renal disease (HCC) 04/07/2022   Personal history of transient ischemic attack (TIA), and cerebral infarction without residual deficits 04/07/2022   Renal osteodystrophy 04/07/2022   Venous stasis ulcer of right calf (HCC) 03/31/2022   Fistula, colovaginal 03/26/2022   Diarrhea 03/26/2022   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   Sepsis without acute organ dysfunction (HCC)     Bacteremia    Acute pancreatitis 02/01/2022   Abdominal pain 02/01/2022   SIRS (systemic inflammatory response syndrome) (HCC) 02/01/2022   Transaminitis 02/01/2022   History of anemia due to chronic kidney disease 02/01/2022   Paroxysmal atrial fibrillation (HCC) 02/01/2022   DKA (diabetic ketoacidosis) (HCC) 01/14/2022   NSTEMI (non-ST elevated myocardial infarction) (HCC) 03/05/2021   Acute renal failure superimposed on stage 4 chronic kidney disease (HCC) 08/22/2020   Hypoalbuminemia 05/25/2020   GERD (gastroesophageal reflux disease) 05/25/2020   Pressure injury of skin 05/17/2020   Acute on chronic combined systolic and diastolic congestive heart failure (HCC) 03/07/2020   Type 2 diabetes mellitus with diabetic polyneuropathy, with long-term current use of insulin (HCC) 03/07/2020   Obesity, Class III, BMI 40-49.9 (morbid obesity) (HCC) 03/07/2020   Common bile duct (CBD) obstruction 05/28/2019   Benign neoplasm of ascending colon    Benign neoplasm of transverse colon    Benign neoplasm of descending colon    Benign neoplasm of sigmoid colon    Gastric polyps    Hyperkalemia 03/11/2019   Prolonged QT interval 03/11/2019   Acute blood loss anemia 03/11/2019   Onychomycosis 06/21/2018   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)    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 (HCC)    PVD (peripheral vascular disease) (HCC) 10/26/2017   E-coli UTI 07/27/2017   Hypothyroidism 07/27/2017   AKI (acute kidney injury) (HCC)    PAH (pulmonary artery hypertension) (HCC)    Impaired ambulation 07/19/2017   Nausea & vomiting 07/15/2017   Leg cramps 02/27/2017   Peripheral edema 01/12/2017   Diabetic neuropathy (HCC) 11/12/2016   CKD (chronic kidney disease), stage IV (HCC) 10/24/2015   Anemia of chronic disease 10/03/2015   Generalized anxiety disorder 10/03/2015   Insomnia 10/03/2015   Hyperglycemia due to diabetes mellitus (HCC) 06/07/2015   Chronic  diastolic CHF (congestive heart failure) (HCC) 06/07/2015   Non compliance with medical treatment 04/17/2014   Rotator cuff tear 03/14/2014   Class 3 obesity (HCC) 09/23/2013   Chronic HFrEF (heart failure with reduced ejection fraction) (HCC) 06/03/2013   Hypotension 12/25/2012   Urinary incontinence    MDD (major depressive disorder) 11/12/2010   RBBB (right bundle branch block)    Wide-complex tachycardia    Coronary artery disease    Hyperlipemia 01/22/2009   Essential hypertension 01/22/2009   Home Medication(s) Prior to Admission medications   Medication Sig Start Date End Date Taking? Authorizing Provider  acetaminophen (TYLENOL) 325 MG tablet Take 2 tablets (650 mg total) by mouth every 6 (six) hours as needed for mild pain (or Fever >/= 101). 12/10/22   Angiulli, Mcarthur Rossetti, PA-C  albuterol (VENTOLIN HFA) 108 (90 Base) MCG/ACT inhaler Inhale 1-2 puffs into the lungs every 6 (six) hours as needed for wheezing or shortness of breath. 12/11/22   Angiulli, Mcarthur Rossetti, PA-C  amiodarone (PACERONE) 200 MG tablet Take 1 tablet (200 mg total) by mouth daily. 12/11/22   Angiulli, Mcarthur Rossetti, PA-C  apixaban (ELIQUIS) 5 MG TABS tablet Take 1 tablet (5 mg total) by mouth 2 (two) times daily. 12/11/22   Angiulli, Mcarthur Rossetti, PA-C  ascorbic acid (VITAMIN C) 500 MG tablet Take 1 tablet (500 mg total) by mouth daily. 12/11/22   Angiulli, Mcarthur Rossetti, PA-C  atorvastatin (LIPITOR) 80 MG tablet TAKE 1 TABLET BY MOUTH EVERY DAY 12/26/22   Bensimhon, Bevelyn Buckles, MD  Calcium Carbonate Antacid (CALCIUM CARBONATE, DOSED IN MG ELEMENTAL CALCIUM,) 1250 MG/5ML SUSP Take 5 mLs (500 mg of elemental calcium total) by mouth every 6 (six) hours as needed for indigestion. 12/10/22   Angiulli, Mcarthur Rossetti, PA-C  clonazePAM (KLONOPIN) 0.5 MG disintegrating tablet Take 1 tablet (0.5 mg total) by mouth 2 (two) times daily. 12/29/22   Donita Brooks, MD  folic acid (FOLVITE) 1 MG tablet Take 1 tablet (1 mg total) by mouth daily. 01/09/23    Donita Brooks, MD  insulin aspart (NOVOLOG FLEXPEN) 100 UNIT/ML FlexPen Inject 0-15 Units into the skin See admin instructions. Inject 12 units subcutaneously 3 times daily with meals (0900, 1300 & 1700) & as needed via sliding scale insulin CBG <70=Notify NP/MD 71-149=0 units,150-199=4 units, 200-249=6 units,250- 299=10 units, 300-349=12 units, 350-399=15 units, > 400 call MD ( prime pen with 2 units prior  to set dose) Patient taking differently: Inject 0-16 Units into the skin See admin instructions. Inject 12 units subcutaneously 3 times daily with meals (0900, 1300 & 1700) & as needed via sliding scale insulin CBG <70=Notify NP/MD 71-149=0 units,150-199=4 units, 200-249=6 units,250- 299=10 units, 300-349=12 units, 350-399=15 units, > 400 call MD ( prime pen with 2 units prior  to set dose) 09/05/22   Leroy Sea, MD  insulin glargine (LANTUS) 100 UNIT/ML Solostar Pen Inject 12 units into the skin at bedtime (Max 40 units) 12/11/22   Angiulli, Mcarthur Rossetti, PA-C  insulin glargine (LANTUS) 100 UNIT/ML Solostar Pen Inject 12 Units into the skin at bedtime. 12/11/22   Angiulli, Mcarthur Rossetti, PA-C  levothyroxine (SYNTHROID) 75 MCG tablet Take 1 tablet (75 mcg total) by mouth daily before breakfast. 12/11/22   Angiulli, Mcarthur Rossetti, PA-C  methocarbamol (ROBAXIN) 750 MG tablet Take 1 tablet (750 mg total) by mouth every 6 (six) hours as needed for muscle spasms. 12/11/22   Angiulli, Mcarthur Rossetti, PA-C  metoCLOPramide (REGLAN) 5 MG tablet Take 1 tablet (5 mg total) by mouth 3 (three) times daily before meals. 01/09/23   Donita Brooks, MD  midodrine (PROAMATINE) 10 MG tablet Take 1 tablet (10 mg total) by mouth Every Tuesday,Thursday,and Saturday with dialysis. 12/11/22   Angiulli, Mcarthur Rossetti, PA-C  oxyCODONE (OXY IR/ROXICODONE) 5 MG immediate release tablet Take 0.5-1 tablets (2.5-5 mg total) by mouth every 4 (four) hours as needed for moderate pain (pain score 4-6). 12/11/22   Angiulli, Mcarthur Rossetti, PA-C  pantoprazole  (PROTONIX) 40 MG tablet Take 1 tablet (40 mg total) by mouth 2 (two) times daily before a meal. 01/09/23 01/09/24  Donita Brooks, MD  polyethylene glycol (MIRALAX / GLYCOLAX) 17 g packet Take 17 g by mouth daily. 12/10/22   Angiulli, Mcarthur Rossetti, PA-C  ramelteon (ROZEREM) 8 MG tablet Take 1 tablet (8 mg total) by mouth at bedtime. 12/11/22   Angiulli, Mcarthur Rossetti, PA-C  senna-docusate (SENOKOT-S) 8.6-50 MG tablet Take 2 tablets by mouth at bedtime. 12/10/22   Angiulli, Mcarthur Rossetti, PA-C  sevelamer carbonate (RENVELA) 800 MG tablet Take 2 tablets (1,600 mg total) by mouth 3 (three) times daily with meals. (  0800, 1200 & 1700) 12/11/22   Angiulli, Mcarthur Rossetti, PA-C  venlafaxine XR (EFFEXOR-XR) 75 MG 24 hr capsule Take 1 capsule (75 mg total) by mouth daily with breakfast. Stop trintellix 01/05/23   Donita Brooks, MD  Zinc Sulfate 220 (50 Zn) MG TABS Take 1 tablet (220 mg total) by mouth daily. 12/11/22   Angiulli, Mcarthur Rossetti, PA-C                                                                                                                                    Past Surgical History Past Surgical History:  Procedure Laterality Date   A/V FISTULAGRAM Left 11/07/2022   Procedure: A/V Fistulagram;  Surgeon: Leonie Douglas, MD;  Location: [facility] INVASIVE CV LAB;  Service: Cardiovascular;  Laterality: Left;   ABDOMINAL HYSTERECTOMY  1980's   AMPUTATION Right 02/24/2018   Procedure: RIGHT FOOT GREAT TOE AND 2ND TOE AMPUTATION;  Surgeon: Nadara Mustard, MD;  Location: [facility] OR;  Service: Orthopedics;  Laterality: Right;   AMPUTATION Right 04/30/2018   Procedure: RIGHT TRANSMETATARSAL AMPUTATION;  Surgeon: Nadara Mustard, MD;  Location: [facility] OR;  Service: Orthopedics;  Laterality: Right;   AMPUTATION Right 05/02/2022   Procedure: RIGHT BELOW KNEE AMPUTATION;  Surgeon: Nadara Mustard, MD;  Location: [facility] OR;  Service: Orthopedics;  Laterality: Right;   APPLICATION OF WOUND VAC Right 06/13/2022   Procedure: APPLICATION OF WOUND VAC;   Surgeon: Nadara Mustard, MD;  Location: [facility] OR;  Service: Orthopedics;  Laterality: Right;   APPLICATION OF WOUND VAC Left 11/14/2022   Procedure: APPLICATION OF WOUND VAC;  Surgeon: Nadara Mustard, MD;  Location: [facility] OR;  Service: Orthopedics;  Laterality: Left;   AV FISTULA PLACEMENT Left 04/02/2022   Procedure: LEFT ARM ARTERIOVENOUS (AV) FISTULA CREATION;  Surgeon: Nada Libman, MD;  Location: [facility] OR;  Service: Vascular;  Laterality: Left;  PERIPHERAL NERVE BLOCK   BASCILIC VEIN TRANSPOSITION Left 07/31/2022   Procedure: LEFT ARM SECOND STAGE BASILIC VEIN TRANSPOSITION;  Surgeon: Nada Libman, MD;  Location: [facility] OR;  Service: Vascular;  Laterality: Left;   BIOPSY  05/27/2020   Procedure: BIOPSY;  Surgeon: Lanelle Bal, DO;  Location: [facility] ENDO SUITE;  Service: Endoscopy;;   CATARACT EXTRACTION, BILATERAL Bilateral ?2013   COLONOSCOPY W/ POLYPECTOMY     COLONOSCOPY WITH PROPOFOL N/A 03/13/2019   Procedure: COLONOSCOPY WITH PROPOFOL;  Surgeon: Beverley Fiedler, MD;  Location: [facility] ENDOSCOPY;  Service: Gastroenterology;  Laterality: N/A;   CORONARY ANGIOPLASTY WITH STENT PLACEMENT  1999; 2007   "1 + 1"   ERCP N/A 02/03/2022   Procedure: ENDOSCOPIC RETROGRADE CHOLANGIOPANCREATOGRAPHY (ERCP);  Surgeon: Jeani Hawking, MD;  Location: [facility] ENDOSCOPY;  Service: Gastroenterology;  Laterality: N/A;   ESOPHAGOGASTRODUODENOSCOPY (EGD) WITH PROPOFOL N/A 03/13/2019   Procedure: ESOPHAGOGASTRODUODENOSCOPY (EGD) WITH PROPOFOL;  Surgeon: Beverley Fiedler, MD;  Location: [facility] ENDOSCOPY;  Service: Gastroenterology;  Laterality: N/A;  ESOPHAGOGASTRODUODENOSCOPY (EGD) WITH PROPOFOL N/A 05/27/2020   Procedure: ESOPHAGOGASTRODUODENOSCOPY (EGD) WITH PROPOFOL;  Surgeon: Lanelle Bal, DO;  [facility];  Service: Endoscopy;  Laterality: N/A;   ESOPHAGOGASTRODUODENOSCOPY (EGD) WITH PROPOFOL N/A 09/03/2022   Procedure: ESOPHAGOGASTRODUODENOSCOPY (EGD) WITH PROPOFOL;  Surgeon: Jeani Hawking, MD;  Location: [facility]  ENDOSCOPY;  Service: Gastroenterology;  Laterality: N/A;   EYE SURGERY Bilateral    lazer   FLEXIBLE SIGMOIDOSCOPY N/A 05/23/2022   Procedure: FLEXIBLE SIGMOIDOSCOPY;  Surgeon: Jeani Hawking, MD;  Location: [facility] ENDOSCOPY;  Service: Gastroenterology;  Laterality: N/A;   FLEXIBLE SIGMOIDOSCOPY N/A 05/24/2022   Procedure: FLEXIBLE SIGMOIDOSCOPY;  Surgeon: Imogene Burn, MD;  Location: [facility] ENDOSCOPY;  Service: Gastroenterology;  Laterality: N/A;   FLEXIBLE SIGMOIDOSCOPY N/A 09/03/2022   Procedure: FLEXIBLE SIGMOIDOSCOPY;  Surgeon: Jeani Hawking, MD;  Location: [facility] ENDOSCOPY;  Service: Gastroenterology;  Laterality: N/A;   HEMOSTASIS CLIP PLACEMENT  03/13/2019   Procedure: HEMOSTASIS CLIP PLACEMENT;  Surgeon: Beverley Fiedler, MD;  Location: [facility] ENDOSCOPY;  Service: Gastroenterology;;   HEMOSTASIS CLIP PLACEMENT  05/23/2022   Procedure: HEMOSTASIS CLIP PLACEMENT;  Surgeon: Jeani Hawking, MD;  Location: [facility] ENDOSCOPY;  Service: Gastroenterology;;   HEMOSTASIS CONTROL  05/24/2022   Procedure: HEMOSTASIS CONTROL;  Surgeon: Imogene Burn, MD;  Location: [facility] ENDOSCOPY;  Service: Gastroenterology;;   HOT HEMOSTASIS N/A 05/23/2022   Procedure: HOT HEMOSTASIS (ARGON PLASMA COAGULATION/BICAP);  Surgeon: Jeani Hawking, MD;  Location: [facility] ENDOSCOPY;  Service: Gastroenterology;  Laterality: N/A;   I & D EXTREMITY Left 05/05/2022   Procedure: IRRIGATION AND DEBRIDEMENT LEFT ARM AV FISTULA;  Surgeon: Cephus Shelling, MD;  Location: [facility] OR;  Service: Vascular;  Laterality: Left;   I & D EXTREMITY N/A 11/14/2022   Procedure: IRRIGATION AND DEBRIDEMENT OF LOWER EXTREMITY WOUND;  Surgeon: Nadara Mustard, MD;  Location: [facility] OR;  Service: Orthopedics;  Laterality: N/A;   INSERTION OF DIALYSIS CATHETER Right 04/02/2022   Procedure: INSERTION OF TUNNELED DIALYSIS CATHETER;  Surgeon: Nada Libman, MD;  Location: [facility] OR;  Service: Vascular;  Laterality: Right;   KNEE ARTHROSCOPY Left 10/25/2006   POLYPECTOMY  03/13/2019    Procedure: POLYPECTOMY;  Surgeon: Beverley Fiedler, MD;  Location: [facility] ENDOSCOPY;  Service: Gastroenterology;;   REMOVAL OF STONES  02/03/2022   Procedure: REMOVAL OF STONES;  Surgeon: Jeani Hawking, MD;  Location: [facility] ENDOSCOPY;  Service: Gastroenterology;;   REVISON OF ARTERIOVENOUS FISTULA Left 08/20/2022   Procedure: REVISON OF LEFT ARM ARTERIOVENOUS FISTULA;  Surgeon: Nada Libman, MD;  Location: [facility] OR;  Service: Vascular;  Laterality: Left;   RIGHT HEART CATH N/A 07/24/2017   Procedure: RIGHT HEART CATH;  Surgeon: Dolores Patty, MD;  Location: [facility] INVASIVE CV LAB;  Service: Cardiovascular;  Laterality: N/A;   RIGHT HEART CATHETERIZATION N/A 09/22/2013   Procedure: RIGHT HEART CATH;  Surgeon: Dolores Patty, MD;  Location: [facility] CATH LAB;  Service: Cardiovascular;  Laterality: N/A;   SHOULDER ARTHROSCOPY WITH OPEN ROTATOR CUFF REPAIR Right 03/14/2014   Procedure: RIGHT SHOULDER ARTHROSCOPY WITH BICEPS RELEASE, OPEN SUBSCAPULA REPAIR, OPEN SUPRASPINATUS REPAIR.;  Surgeon: Cammy Copa, MD;  Location: [facility] OR;  Service: Orthopedics;  Laterality: Right;   SPHINCTEROTOMY  02/03/2022   Procedure: SPHINCTEROTOMY;  Surgeon: Jeani Hawking, MD;  Location: [facility] ENDOSCOPY;  Service: Gastroenterology;;   STUMP REVISION Right 06/13/2022   Procedure: REVISION RIGHT BELOW KNEE AMPUTATION;  Surgeon: Nadara Mustard, MD;  Location: [facility] OR;  Service: Orthopedics;  Laterality: Right;   TEE WITHOUT CARDIOVERSION N/A  02/04/2022   Procedure: TRANSESOPHAGEAL ECHOCARDIOGRAM (TEE);  Surgeon: Dolores Patty, MD;  Location: [facility] ENDOSCOPY;  Service: Cardiovascular;  Laterality: N/A;   THROMBECTOMY W/ EMBOLECTOMY Left 08/20/2022   Procedure: THROMBECTOMY OF LEFT ARM ARTERIOVENOUS FISTULA;  Surgeon: Nada Libman, MD;  Location: [facility] OR;  Service: Vascular;  Laterality: Left;   TOE AMPUTATION Right 02/24/2018   GREAT TOE AND 2ND TOE AMPUTATION   TUBAL LIGATION  1970's   Family History Family History  Problem  Relation Age of Onset   Heart attack Mother 52    Social History Social History   Tobacco Use   Smoking status: Former    Current packs/day: 0.00    Average packs/day: 3.0 packs/day for 32.0 years (96.0 ttl pk-yrs)    Types: Cigarettes    Start date: 10/24/1965    Quit date: 10/24/1997    Years since quitting: 25.2   Smokeless tobacco: Never  Vaping Use   Vaping status: Never Used  Substance Use Topics   Alcohol use: Not Currently    Comment: "might have 2-3 daiquiris in the summer"   Drug use: No   Allergies Cephalexin and Codeine  Review of Systems Review of Systems  Gastrointestinal:  Positive for abdominal pain, diarrhea, nausea and vomiting.    Physical Exam Vital Signs  I have reviewed the triage vital signs BP (!) 182/85 (BP Location: Right Arm)   Pulse 81   Temp 98 F (36.7 C)   Resp 18   SpO2 94%   Physical Exam Vitals and nursing note reviewed.  Constitutional:      General: She is not in acute distress.    Appearance: She is well-developed.  HENT:     Head: Normocephalic and atraumatic.  Eyes:     Conjunctiva/sclera: Conjunctivae normal.  Cardiovascular:     Rate and Rhythm: Normal rate and regular rhythm.     Heart sounds: No murmur heard. Pulmonary:     Effort: Pulmonary effort is normal. No respiratory distress.     Breath sounds: Normal breath sounds.  Abdominal:     Palpations: Abdomen is soft.     Tenderness: There is abdominal tenderness in the right lower quadrant.  Musculoskeletal:        General: No swelling.     Cervical back: Neck supple.  Skin:    General: Skin is warm and dry.     Capillary Refill: Capillary refill takes less than 2 seconds.  Neurological:     Mental Status: She is alert.  Psychiatric:        Mood and Affect: Mood normal.     ED Results and Treatments Labs (all labs ordered are listed, but only abnormal results are displayed) Labs Reviewed  COMPREHENSIVE METABOLIC PANEL - Abnormal; Notable for the  following components:      Result Value   Sodium 124 (*)    Chloride 85 (*)    CO2 20 (*)    Glucose, Bld 687 (*)    BUN 52 (*)    Creatinine, Ser 4.27 (*)    Albumin 3.0 (*)    Alkaline Phosphatase 300 (*)    GFR, Estimated 10 (*)    Anion gap 19 (*)    All other components within normal limits  CBC - Abnormal; Notable for the following components:   Hemoglobin 10.6 (*)    HCT 35.9 (*)    MCV 75.6 (*)    MCH 22.3 (*)    MCHC 29.5 (*)  RDW 17.5 (*)    All other components within normal limits  BLOOD GAS, VENOUS - Abnormal; Notable for the following components:   pO2, Ven <31 (*)    All other components within normal limits  BETA-HYDROXYBUTYRIC ACID - Abnormal; Notable for the following components:   Beta-Hydroxybutyric Acid 1.85 (*)    All other components within normal limits  C DIFFICILE QUICK SCREEN W PCR REFLEX    LIPASE, BLOOD  URINALYSIS, ROUTINE W REFLEX MICROSCOPIC                                                                                                                          Radiology No results found.  Pertinent labs & imaging results that were available during my care of the patient were reviewed by me and considered in my medical decision making (see MDM for details).  Medications Ordered in ED Medications  lactated ringers bolus 1,000 mL (1,000 mLs Intravenous New Bag/Given 01/15/23 0611)  prochlorperazine (COMPAZINE) injection 10 mg (10 mg Intravenous Given 01/15/23 0608)  diphenhydrAMINE (BENADRYL) injection 25 mg (25 mg Intravenous Given 01/15/23 0607)  insulin aspart (novoLOG) injection 10 Units (10 Units Subcutaneous Given 01/15/23 0615)                                                                                                                                     Procedures .Critical Care  Performed by: Glendora Score, MD Authorized by: Glendora Score, MD   Critical care provider statement:    Critical care time (minutes):  30    Critical care was necessary to treat or prevent imminent or life-threatening deterioration of the following conditions:  Endocrine crisis   Critical care was time spent personally by me on the following activities:  Development of treatment plan with patient or surrogate, discussions with consultants, evaluation of patient's response to treatment, examination of patient, ordering and review of laboratory studies, ordering and review of radiographic studies, ordering and performing treatments and interventions, pulse oximetry, re-evaluation of patient's condition and review of old charts   (including critical care time)  Medical Decision Making / ED Course   This patient presents to the ED for concern of abdominal pain nausea, vomiting, headache, this involves an extensive number of treatment options, and is a complaint that carries with it a high risk of complications and morbidity.  The differential diagnosis includes appendicitis, nephrolithiasis, diverticulitis,  epiploic appendagitis, inflammatory bowel disease, constipation, gastroenteritis, C. difficile colitis, DKA, HHS  MDM: Patient seen in the emergency room for evaluation of abdominal pain nausea and vomiting.  Physical exam with tenderness in the right lower quadrant but is otherwise unremarkable.  Laboratory evaluation with no significant leukocytosis but does have a significant elevated glucose at 687 with pseudohyponatremia to 124 that corrects to 133.  BUN 52, creatinine 4.27 consistent with her history of ESRD and patient is due for dialysis today.  Anion gap is 19 but pH is 7.32 and beta hydroxybutyrate is only minimally elevated at 1.85.  Headache cocktail given for patient's headache and patient given 10 units of subcu insulin and 1 L lactated Ringer's for elevated blood glucose.  At time of signout, patient pending CT abdomen pelvis.  Please see provider signout for continuation of workup.   Additional history obtained: -Additional  history obtained from husband -External records from outside source obtained and reviewed including: Chart review including previous notes, labs, imaging, consultation notes   Lab Tests: -I ordered, reviewed, and interpreted labs.   The pertinent results include:   Labs Reviewed  COMPREHENSIVE METABOLIC PANEL - Abnormal; Notable for the following components:      Result Value   Sodium 124 (*)    Chloride 85 (*)    CO2 20 (*)    Glucose, Bld 687 (*)    BUN 52 (*)    Creatinine, Ser 4.27 (*)    Albumin 3.0 (*)    Alkaline Phosphatase 300 (*)    GFR, Estimated 10 (*)    Anion gap 19 (*)    All other components within normal limits  CBC - Abnormal; Notable for the following components:   Hemoglobin 10.6 (*)    HCT 35.9 (*)    MCV 75.6 (*)    MCH 22.3 (*)    MCHC 29.5 (*)    RDW 17.5 (*)    All other components within normal limits  BLOOD GAS, VENOUS - Abnormal; Notable for the following components:   pO2, Ven <31 (*)    All other components within normal limits  BETA-HYDROXYBUTYRIC ACID - Abnormal; Notable for the following components:   Beta-Hydroxybutyric Acid 1.85 (*)    All other components within normal limits  C DIFFICILE QUICK SCREEN W PCR REFLEX    LIPASE, BLOOD  URINALYSIS, ROUTINE W REFLEX MICROSCOPIC        Imaging Studies ordered: I ordered imaging studies including CTAP and this is pending   Medicines ordered and prescription drug management: Meds ordered this encounter  Medications   DISCONTD: ondansetron (ZOFRAN) injection 4 mg   lactated ringers bolus 1,000 mL   prochlorperazine (COMPAZINE) injection 10 mg   diphenhydrAMINE (BENADRYL) injection 25 mg   insulin aspart (novoLOG) injection 10 Units    -I have reviewed the patients home medicines and have made adjustments as needed  Critical interventions none    Cardiac Monitoring: The patient was maintained on a cardiac monitor.  I personally viewed and interpreted the cardiac monitored which  showed an underlying rhythm of: NSR  Social Determinants of Health:  Factors impacting patients care include: none   Reevaluation: After the interventions noted above, I reevaluated the patient and found that they have :improved  Co morbidities that complicate the patient evaluation  Past Medical History:  Diagnosis Date   Anemia    hx   Anxiety    Arthritis    "generalized" (03/15/2014)   CAD (coronary artery disease)  MI in 2000 - MI  2007 - treated bare metal stent (no nuclear since then as 9/11)   Carotid artery disease (HCC)    Chronic diastolic heart failure (HCC)    a) ECHO (08/2013) EF 55-60% and RV function nl b) RHC (08/2013) RA 4, RV 30/5/7, PA 25/10 (16), PCWP 7, Fick CO/CI 6.3/2.7, PVR 1.5 WU, PA 61 and 66%   Daily headache    "~ every other day; since I fell in June" (03/15/2014)   Depression    Diabetic retinopathy (HCC)    Dyslipidemia    ESRD (end stage renal disease) (HCC)    Dialysis on Tues Thurs Sat   Exertional shortness of breath    History of kidney stones    HTN (hypertension)    Hypothyroidism    Obesity    Osteoarthritis    PAF (paroxysmal atrial fibrillation) (HCC)    Peripheral neuropathy    bilateral feet/hands   PONV (postoperative nausea and vomiting)    RBBB (right bundle branch block)    Old   Stroke (HCC)    mini strokes   Type II diabetes mellitus (HCC)    Type II, [name]. patient has omnipod insulin pump with Novolin R Insulin      Dispostion: I considered admission for this patient, and disposition pending completion of CT imaging.  Please see provider signout for continuation of workup.     Final Clinical Impression(s) / ED Diagnoses Final diagnoses:  None     @PCDICTATION @    Kerrie Timm, Wyn Forster, MD 01/15/23 0700

## 2023-01-15 NOTE — ED Provider Notes (Addendum)
  Physical Exam  BP (!) 172/90 (BP Location: Right Arm)   Pulse 80   Temp 97.7 F (36.5 C) (Oral)   Resp 20   SpO2 97%   Physical Exam  Procedures  .Critical Care  Performed by: Derwood Kaplan, MD Authorized by: Derwood Kaplan, MD   Critical care provider statement:    Critical care time (minutes):  32   Critical care was necessary to treat or prevent imminent or life-threatening deterioration of the following conditions:  Endocrine crisis   Critical care was time spent personally by me on the following activities:  Development of treatment plan with patient or surrogate, discussions with consultants, evaluation of patient's response to treatment, examination of patient, ordering and review of laboratory studies, ordering and review of radiographic studies, ordering and performing treatments and interventions, pulse oximetry, re-evaluation of patient's condition, review of old charts and obtaining history from patient or surrogate   ED Course / MDM    Medical Decision Making Amount and/or Complexity of Data Reviewed Labs: ordered. Radiology: ordered.  Risk Prescription drug management. Decision regarding hospitalization.   Assumed care of this patient at 7:15 AM.  Patient has history of ESRD on hemodialysis.  Next dialysis session is today. 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 Lajoyce Corners.  According to the patient, she had her blood sugar in the normal range yesterday afternoon.  She had quarter of a small melon and her blood sugar spiked.  Workup in the emergency room reveals no acidosis but patient has mildly elevated beta hydroxybutyrate acid and she has bicarb of 20 and anion gap of 19.  Blood sugar is over 600.  She has received 10 units of subcu insulin and 1 L of IV fluid. I have requested repeat CBG.  Reassessment at 8 AM: Patient's blood sugars  over 600.  She received the insulin at 630. I think patient will need admission to the hospital. I reassessed the patient and she denies any fevers, chills and confirms that she was doing well during the daytime until she had melon.  I have still do not think quarter of a melon made her sugar go to 600. She also informs me that Dr. Lajoyce Corners just saw her and said that her wound is healing well in her leg.  I reviewed Dr. Audrie Lia note myself.  Her white count is 8.6.  I do not think she is septic right now and there is no clear indication that hyperglycemia is because of infection.  She does have a right sided tunneled cath -no signs of infection around it.  Will get repeat BMP and then proceed with admission.  Will defer blood cultures to admitting team.  9:53 AM Blood sugar now even higher.  Anion gap has closed.  We will put her on insulin drip and admit.  Nephrology consulted.  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    ICD-10-CM   1. 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

## 2023-01-15 NOTE — ED Notes (Signed)
Family called out asking for pt to be placed on 3L of oxygen. Stated she wears 3L at night or as needed for SOB.

## 2023-01-15 NOTE — Progress Notes (Signed)
Patient SBAR has been reviewed and nurse has been notified that it is proper to send patient up now.

## 2023-01-15 NOTE — Inpatient Diabetes Management (Signed)
Inpatient Diabetes Program Recommendations  AACE/ADA: New Consensus Statement on Inpatient Glycemic Control (2015)  Target Ranges:  Prepandial:   less than 140 mg/dL      Peak postprandial:   less than 180 mg/dL (1-2 hours)      Critically ill patients:  140 - 180 mg/dL   Lab Results  Component Value Date   GLUCAP 539 (HH) 01/15/2023   HGBA1C 7.9 (H) 08/27/2022    Review of Glycemic Control  Latest Reference Range & Units 01/15/23 10:58 01/15/23 11:34 01/15/23 12:09  Glucose-Capillary 70 - 99 mg/dL >782 (HH) 956 (HH) 213 (HH)   Diabetes history: DM 2 Outpatient Diabetes medications: Novolog sliding scale: <100 - 0 units, 100-150 - give 5 units, 151-200 -  give 10 units, 201-150 -  give 15 units, >250 -give 18 units, <300  Lantus 13 units, >300 give Lantus 26 units Current orders for Inpatient glycemic control:  IV insulin Endotool Note elevated renal function  Endocrinologist: Dr. Talmage Nap last visit less than 1 month ago Sees Endocrinology every 3 months   Inpatient Diabetes Program Recommendations:    At time of transition consider: -   Semglee 20 units -   Novolog 0-6 units tid + hs -   Novolog 3 units tid meal coverage if eating >50% of meals  Spoke with husband over the phone to verify insulin. Per the husband, pt is not always compliant with her beverage or food choices. Reinforced basic diet education to pt. Will follow glucose trends and adjust as needed while here.  Thanks,  Christena Deem RN, MSN, BC-ADM Inpatient Diabetes Coordinator Team Pager [phone] (8a-5p)

## 2023-01-15 NOTE — Consult Note (Signed)
Renal Service Consult Note [facility]  Susan [name] 01/15/2023 Maree Krabbe, MD Requesting Physician: Dr. Arlyss Queen  Reason for Consult: ESRD pt w/ abd pain, N/V HPI: The patient is a 72 y.o. year-old w/ PMH as below who presented to ED for abd pain w/ N/V and headaches, onset early same day after eating watermelon. In ED BS's were up at 700 and pt to be admitted for [facility].  We are asked to see for dialysis.   Pt seen in room, feeling a little better now. No HD issues. She is wondering if they are still going to work on her L upper arm to get a AVF that works.  She says they have done 3 surgeries already and haven't had much luck.  Denies any SOB, swelling or orthopnea or fevers or chills.    ROS - denies CP, no joint pain, no HA, no blurry vision, no rash, no dysuria, no difficulty voiding   Past Medical History  Past Medical History:  Diagnosis Date   Anemia    hx   Anxiety    Arthritis    "generalized" (03/15/2014)   CAD (coronary artery disease)    MI in 2000 - MI  2007 - treated bare metal stent (no nuclear since then as 9/11)   Carotid artery disease (HCC)    Chronic diastolic heart failure (HCC)    a) ECHO (08/2013) EF 55-60% and RV function nl b) RHC (08/2013) RA 4, RV 30/5/7, PA 25/10 (16), PCWP 7, Fick CO/CI 6.3/2.7, PVR 1.5 WU, PA 61 and 66%   Daily headache    "~ every other day; since I fell in June" (03/15/2014)   Depression    Diabetic retinopathy (HCC)    Dyslipidemia    ESRD (end stage renal disease) (HCC)    Dialysis on Tues Thurs Sat   Exertional shortness of breath    History of kidney stones    HTN (hypertension)    Hypothyroidism    Obesity    Osteoarthritis    PAF (paroxysmal atrial fibrillation) (HCC)    Peripheral neuropathy    bilateral feet/hands   PONV (postoperative nausea and vomiting)    RBBB (right bundle branch block)    Old   Stroke (HCC)    mini strokes   Type II diabetes mellitus (HCC)    Type II, [name]  left upper arm. patient has omnipod insulin pump with Novolin R Insulin   Past Surgical History  Past Surgical History:  Procedure Laterality Date   A/V FISTULAGRAM Left 11/07/2022   Procedure: A/V Fistulagram;  Surgeon: Leonie Douglas, MD;  [facility];  Service: Cardiovascular;  Laterality: Left;   ABDOMINAL HYSTERECTOMY  1980's   AMPUTATION Right 02/24/2018   Procedure: RIGHT FOOT GREAT TOE AND 2ND TOE AMPUTATION;  Surgeon: Nadara Mustard, MD;  Location: [facility] OR;  Service: Orthopedics;  Laterality: Right;   AMPUTATION Right 04/30/2018   Procedure: RIGHT TRANSMETATARSAL AMPUTATION;  Surgeon: Nadara Mustard, MD;  Location: [facility] OR;  Service: Orthopedics;  Laterality: Right;   AMPUTATION Right 05/02/2022   Procedure: RIGHT BELOW KNEE AMPUTATION;  Surgeon: Nadara Mustard, MD;  Location: [facility] OR;  Service: Orthopedics;  Laterality: Right;   APPLICATION OF WOUND VAC Right 06/13/2022   Procedure: APPLICATION OF WOUND VAC;  Surgeon: Nadara Mustard, MD;  Location: [facility] OR;  Service: Orthopedics;  Laterality: Right;   APPLICATION OF WOUND VAC Left 11/14/2022   Procedure: APPLICATION OF  WOUND VAC;  Surgeon: Nadara Mustard, MD;  Location: [facility] OR;  Service: Orthopedics;  Laterality: Left;   AV FISTULA PLACEMENT Left 04/02/2022   Procedure: LEFT ARM ARTERIOVENOUS (AV) FISTULA CREATION;  Surgeon: Nada Libman, MD;  [facility]: [facility] OR;  Service: Vascular;  Laterality: Left;  PERIPHERAL NERVE BLOCK   BASCILIC VEIN TRANSPOSITION Left 07/31/2022   Procedure: LEFT ARM SECOND STAGE BASILIC VEIN TRANSPOSITION;  Surgeon: Nada Libman, MD;  [facility]: [facility] OR;  Service: Vascular;  Laterality: Left;   BIOPSY  05/27/2020   Procedure: BIOPSY;  Surgeon: Lanelle Bal, DO;  [facility]: [facility];  Service: Endoscopy;;   CATARACT EXTRACTION, BILATERAL Bilateral ?2013   COLONOSCOPY W/ POLYPECTOMY     COLONOSCOPY WITH PROPOFOL N/A 03/13/2019   Procedure: COLONOSCOPY WITH PROPOFOL;  Surgeon: Beverley Fiedler, MD;   Location: [facility] ENDOSCOPY;  Service: Gastroenterology;  Laterality: N/A;   CORONARY ANGIOPLASTY WITH STENT PLACEMENT  1999; 2007   "1 + 1"   ERCP N/A 02/03/2022   Procedure: ENDOSCOPIC RETROGRADE CHOLANGIOPANCREATOGRAPHY (ERCP);  Surgeon: Jeani Hawking, MD;  Location: [facility] ENDOSCOPY;  Service: Gastroenterology;  Laterality: N/A;   ESOPHAGOGASTRODUODENOSCOPY (EGD) WITH PROPOFOL N/A 03/13/2019   Procedure: ESOPHAGOGASTRODUODENOSCOPY (EGD) WITH PROPOFOL;  Surgeon: Beverley Fiedler, MD;  Location: [facility] ENDOSCOPY;  Service: Gastroenterology;  Laterality: N/A;   ESOPHAGOGASTRODUODENOSCOPY (EGD) WITH PROPOFOL N/A 05/27/2020   Procedure: ESOPHAGOGASTRODUODENOSCOPY (EGD) WITH PROPOFOL;  Surgeon: Lanelle Bal, DO;  [facility];  Service: Endoscopy;  Laterality: N/A;   ESOPHAGOGASTRODUODENOSCOPY (EGD) WITH PROPOFOL N/A 09/03/2022   Procedure: ESOPHAGOGASTRODUODENOSCOPY (EGD) WITH PROPOFOL;  Surgeon: Jeani Hawking, MD;  Location: [facility] ENDOSCOPY;  Service: Gastroenterology;  Laterality: N/A;   EYE SURGERY Bilateral    lazer   FLEXIBLE SIGMOIDOSCOPY N/A 05/23/2022   Procedure: FLEXIBLE SIGMOIDOSCOPY;  Surgeon: Jeani Hawking, MD;  Location: [facility] ENDOSCOPY;  Service: Gastroenterology;  Laterality: N/A;   FLEXIBLE SIGMOIDOSCOPY N/A 05/24/2022   Procedure: FLEXIBLE SIGMOIDOSCOPY;  Surgeon: Imogene Burn, MD;  Location: [facility] ENDOSCOPY;  Service: Gastroenterology;  Laterality: N/A;   FLEXIBLE SIGMOIDOSCOPY N/A 09/03/2022   Procedure: FLEXIBLE SIGMOIDOSCOPY;  Surgeon: Jeani Hawking, MD;  Location: [facility] ENDOSCOPY;  Service: Gastroenterology;  Laterality: N/A;   HEMOSTASIS CLIP PLACEMENT  03/13/2019   Procedure: HEMOSTASIS CLIP PLACEMENT;  Surgeon: Beverley Fiedler, MD;  Location: [facility] ENDOSCOPY;  Service: Gastroenterology;;   HEMOSTASIS CLIP PLACEMENT  05/23/2022   Procedure: HEMOSTASIS CLIP PLACEMENT;  Surgeon: Jeani Hawking, MD;  Location: [facility] ENDOSCOPY;  Service: Gastroenterology;;   HEMOSTASIS CONTROL  05/24/2022    Procedure: HEMOSTASIS CONTROL;  Surgeon: Imogene Burn, MD;  Location: [facility] ENDOSCOPY;  Service: Gastroenterology;;   HOT HEMOSTASIS N/A 05/23/2022   Procedure: HOT HEMOSTASIS (ARGON PLASMA COAGULATION/BICAP);  Surgeon: Jeani Hawking, MD;  Location: [facility] ENDOSCOPY;  Service: Gastroenterology;  Laterality: N/A;   I & D EXTREMITY Left 05/05/2022   Procedure: IRRIGATION AND DEBRIDEMENT LEFT ARM AV FISTULA;  Surgeon: Cephus Shelling, MD;  Location: [facility] OR;  Service: Vascular;  Laterality: Left;   I & D EXTREMITY N/A 11/14/2022   Procedure: IRRIGATION AND DEBRIDEMENT OF LOWER EXTREMITY WOUND;  Surgeon: Nadara Mustard, MD;  [facility];  Service: Orthopedics;  Laterality: N/A;   INSERTION OF DIALYSIS CATHETER Right 04/02/2022   Procedure: INSERTION OF TUNNELED DIALYSIS CATHETER;  Surgeon: Nada Libman, MD;  Location: [facility] OR;  Service: Vascular;  Laterality: Right;   KNEE ARTHROSCOPY Left 10/25/2006   POLYPECTOMY  03/13/2019   Procedure: POLYPECTOMY;  Surgeon: Rhea Belton,  Carie Caddy, MD;  Location: [facility] ENDOSCOPY;  Service: Gastroenterology;;   REMOVAL OF STONES  02/03/2022   Procedure: REMOVAL OF STONES;  Surgeon: Jeani Hawking, MD;  Location: [facility] ENDOSCOPY;  Service: Gastroenterology;;   REVISON OF ARTERIOVENOUS FISTULA Left 08/20/2022   Procedure: REVISON OF LEFT ARM ARTERIOVENOUS FISTULA;  Surgeon: Nada Libman, MD;  [facility];  Service: Vascular;  Laterality: Left;   RIGHT HEART CATH N/A 07/24/2017   Procedure: RIGHT HEART CATH;  Surgeon: Dolores Patty, MD;  [facility];  Service: Cardiovascular;  Laterality: N/A;   RIGHT HEART CATHETERIZATION N/A 09/22/2013   Procedure: RIGHT HEART CATH;  Surgeon: Dolores Patty, MD;  Location: [facility] CATH LAB;  Service: Cardiovascular;  Laterality: N/A;   SHOULDER ARTHROSCOPY WITH OPEN ROTATOR CUFF REPAIR Right 03/14/2014   Procedure: RIGHT SHOULDER ARTHROSCOPY WITH BICEPS RELEASE, OPEN SUBSCAPULA REPAIR, OPEN SUPRASPINATUS REPAIR.;   Surgeon: Cammy Copa, MD;  Location: [facility] OR;  Service: Orthopedics;  Laterality: Right;   SPHINCTEROTOMY  02/03/2022   Procedure: SPHINCTEROTOMY;  Surgeon: Jeani Hawking, MD;  Location: [facility] ENDOSCOPY;  Service: Gastroenterology;;   STUMP REVISION Right 06/13/2022   Procedure: REVISION RIGHT BELOW KNEE AMPUTATION;  Surgeon: Nadara Mustard, MD;  Location: [facility] OR;  Service: Orthopedics;  Laterality: Right;   TEE WITHOUT CARDIOVERSION N/A 02/04/2022   Procedure: TRANSESOPHAGEAL ECHOCARDIOGRAM (TEE);  Surgeon: Dolores Patty, MD;  Location: [facility] ENDOSCOPY;  Service: Cardiovascular;  Laterality: N/A;   THROMBECTOMY W/ EMBOLECTOMY Left 08/20/2022   Procedure: THROMBECTOMY OF LEFT ARM ARTERIOVENOUS FISTULA;  Surgeon: Nada Libman, MD;  [facility];  Service: Vascular;  Laterality: Left;   TOE AMPUTATION Right 02/24/2018   GREAT TOE AND 2ND TOE AMPUTATION   TUBAL LIGATION  1970's   Family History  Family History  Problem Relation Age of Onset   Heart attack Mother 23   Social History  reports that she quit smoking about 25 years ago. Her smoking use included cigarettes. She started smoking about 57 years ago. She has a 96 pack-year smoking history. She has never used smokeless tobacco. She reports that she does not currently use alcohol. She reports that she does not use drugs. Allergies  Allergies  Allergen Reactions   Cephalexin Diarrhea and Other (See Comments)   Codeine Nausea And Vomiting and Other (See Comments)   Home medications Prior to Admission medications   Medication Sig Start Date End Date Taking? Authorizing Provider  acetaminophen (TYLENOL) 325 MG tablet Take 2 tablets (650 mg total) by mouth every 6 (six) hours as needed for mild pain (or Fever >/= 101). 12/10/22  Yes Angiulli, Mcarthur Rossetti, PA-C  albuterol (VENTOLIN HFA) 108 (90 Base) MCG/ACT inhaler Inhale 1-2 puffs into the lungs every 6 (six) hours as needed for wheezing or shortness of breath. 12/11/22  Yes Angiulli,  Mcarthur Rossetti, PA-C  amiodarone (PACERONE) 200 MG tablet Take 1 tablet (200 mg total) by mouth daily. 12/11/22  Yes Angiulli, Mcarthur Rossetti, PA-C  apixaban (ELIQUIS) 5 MG TABS tablet Take 1 tablet (5 mg total) by mouth 2 (two) times daily. 12/11/22  Yes Angiulli, Mcarthur Rossetti, PA-C  ascorbic acid (VITAMIN C) 500 MG tablet Take 1 tablet (500 mg total) by mouth daily. 12/11/22  Yes Angiulli, Mcarthur Rossetti, PA-C  atorvastatin (LIPITOR) 80 MG tablet TAKE 1 TABLET BY MOUTH EVERY DAY 12/26/22  Yes Bensimhon, Bevelyn Buckles, MD  Calcium Carbonate Antacid (CALCIUM CARBONATE, DOSED IN MG ELEMENTAL CALCIUM,) 1250 MG/5ML SUSP Take 5 mLs (  500 mg of elemental calcium total) by mouth every 6 (six) hours as needed for indigestion. 12/10/22  Yes Angiulli, Mcarthur Rossetti, PA-C  carvedilol (COREG) 6.25 MG tablet Take 6.25 mg by mouth 2 (two) times daily with a meal. 08/11/22  Yes [provider]  clonazePAM (KLONOPIN) 0.5 MG disintegrating tablet Take 1 tablet (0.5 mg total) by mouth 2 (two) times daily. 12/29/22  Yes Donita Brooks, MD  folic acid (FOLVITE) 1 MG tablet Take 1 tablet (1 mg total) by mouth daily. 01/09/23  Yes Donita Brooks, MD  insulin aspart (NOVOLOG FLEXPEN) 100 UNIT/ML FlexPen Inject 0-15 Units into the skin See admin instructions. Inject 12 units subcutaneously 3 times daily with meals (0900, 1300 & 1700) & as needed via sliding scale insulin CBG <70=Notify NP/MD 71-149=0 units,150-199=4 units, 200-249=6 units,250- 299=10 units, 300-349=12 units, 350-399=15 units, > 400 call MD ( prime pen with 2 units prior  to set dose) Patient taking differently: Inject 0-16 Units into the skin 3 (three) times daily with meals. 09/05/22  Yes Leroy Sea, MD  insulin glargine (LANTUS) 100 UNIT/ML Solostar Pen Inject 12 units into the skin at bedtime (Max 40 units) 12/11/22  Yes Angiulli, Mcarthur Rossetti, PA-C  levothyroxine (SYNTHROID) 75 MCG tablet Take 1 tablet (75 mcg total) by mouth daily before breakfast. 12/11/22  Yes Angiulli, Mcarthur Rossetti,  PA-C  methocarbamol (ROBAXIN) 750 MG tablet Take 1 tablet (750 mg total) by mouth every 6 (six) hours as needed for muscle spasms. 12/11/22  Yes Angiulli, Mcarthur Rossetti, PA-C  metoCLOPramide (REGLAN) 5 MG tablet Take 1 tablet (5 mg total) by mouth 3 (three) times daily before meals. 01/09/23  Yes Donita Brooks, MD  midodrine (PROAMATINE) 10 MG tablet Take 1 tablet (10 mg total) by mouth Every Tuesday,Thursday,and Saturday with dialysis. 12/11/22  Yes Angiulli, Mcarthur Rossetti, PA-C  pantoprazole (PROTONIX) 40 MG tablet Take 1 tablet (40 mg total) by mouth 2 (two) times daily before a meal. 01/09/23 01/09/24 Yes [name], Priscille Heidelberg, MD  polyethylene glycol (MIRALAX / GLYCOLAX) 17 g packet Take 17 g by mouth daily. 12/10/22  Yes Angiulli, Mcarthur Rossetti, PA-C  ramelteon (ROZEREM) 8 MG tablet Take 1 tablet (8 mg total) by mouth at bedtime. 12/11/22  Yes Angiulli, Mcarthur Rossetti, PA-C  senna-docusate (SENOKOT-S) 8.6-50 MG tablet Take 2 tablets by mouth at bedtime. Patient taking differently: Take 2 tablets by mouth at bedtime as needed for mild constipation. 12/10/22  Yes Angiulli, Mcarthur Rossetti, PA-C  sevelamer carbonate (RENVELA) 800 MG tablet Take 2 tablets (1,600 mg total) by mouth 3 (three) times daily with meals. (0800, 1200 & 1700) 12/11/22  Yes Angiulli, Mcarthur Rossetti, PA-C  venlafaxine XR (EFFEXOR-XR) 75 MG 24 hr capsule Take 1 capsule (75 mg total) by mouth daily with breakfast. Stop trintellix 01/05/23  Yes Donita Brooks, MD  Zinc Sulfate 220 (50 Zn) MG TABS Take 1 tablet (220 mg total) by mouth daily. 12/11/22  Yes Angiulli, Mcarthur Rossetti, PA-C  insulin glargine (LANTUS) 100 UNIT/ML Solostar Pen Inject 12 Units into the skin at bedtime. Patient not taking: Reported on 01/15/2023 12/11/22   Charlton Amor, PA-C     Vitals:   01/15/23 0745 01/15/23 1030 01/15/23 1130 01/15/23 1146  BP: (!) 172/90 (!) 171/86 (!) 160/83   Pulse: 80 83 85   Resp: 20 20  18   Temp: 97.7 F (36.5 C) 97.7 F (36.5 C)  97.6 F (36.4 C)  TempSrc: Oral    Oral  SpO2:  97% 100% 100%    Exam Gen alert, no distress No rash, cyanosis or gangrene Sclera anicteric, throat clear  No jvd or bruits Chest bilat crackles 1/3 up, did not clear w/ breathing RRR no RG Abd soft ntnd no mass or ascites +bs GU defer Ext L leg wrapped, 1+ hip edema bilat  RLE BKA no edema.  Neuro is alert, Ox 3 , nf    RIJ TDC in place/ old AVF LUA no bruit     VS today --> BP 171/86, HR 85, RR 18- 22, afeb   3L [place]    CT abd / pelvis w/ contrast --> IMPRESSION: Anasarca with small layering pleural effusions and evidence of lung base pulmonary interstitial edema. Body wall edema and left pleural effusion both regressed compared to April. Nonspecific circumferential rectal wall thickening. This most resembles Proctitis. Artifact due to under-distension or bowel edema due to #1 felt less likely.  Some generalized mesenteric edema suspected and related to #1. Some of this in the region of the appendix, but the appendix appears to remain normal.  Cholelithiasis without CT evidence of acute cholecystitis. Hepatic steatosis.  Evidence of renal insufficiency.   Home meds include - albuterol, klonopin, insulin aspart/ glargine, reglan 5 tid, protonix, ramelteon, renvela 2 ac tid, effexor xr, prns     OP HD: TTS [facility]  4h  400/800   83kg  2/2 bath   RIJ TDC   Heparin 4000 + 2000 midrun - last OP HD 8/20, post wt 83.0kg   - good compliance overall - rocaltrol 0.75 mcg po three times per week - mircera 120 mcg  IV q 4 wks, last 8/10, due 8/24   Assessment/ Plan: Abdominal pain - w/ N/V.  CT done, w/u in progress. Per pmd.  DM2/ HHS - per pmd, please do not give IVF"s with the protocol, only IV insulin and if needs BS support use D5W 50- 75 cc/hr (not LR or NS).  ESRD - on HD TTS. Has not missed OP HD. HD today/ tonight.  HTN - is not on BP lowering meds at home. Try to lower w/ large UF on hd today Hypotension - is on midodrine at home presumably for low/ soft BP's at Op  HD Volume - mild-mod edema on exam, possibly pulm edema (crackles on exam, GG changes on CT scan). Get CXR. Max UF 3 L as tol w/ HD today/ tonight.  Anemia esrd - Hb 10.6 here, next esa due on 8/24 which is Sat. Will order 120 mcg darbe weekly on Sat while here.  MBD ckd - CCa in range, add on phos. Cont po vdra three times per week.  DM2 - on insulin      Vinson Moselle  MD [facility] 01/15/2023, 1:26 PM  Recent Labs  Lab 01/15/23 0513 01/15/23 0813  HGB 10.6*  --   ALBUMIN 3.0*  --   CALCIUM 9.0 8.7*  CREATININE 4.27* 4.34*  K 4.0 4.0   Inpatient medications:  amiodarone  200 mg Oral Daily   apixaban  5 mg Oral BID   atorvastatin  80 mg Oral QHS   carvedilol  6.25 mg Oral BID WC   folic acid  1 mg Oral Daily   levothyroxine  75 mcg Oral QAC breakfast   midodrine  10 mg Oral Q T,Th,Sa-HD    insulin 10.5 Units/hr (01/15/23 1135)   dextrose, trimethobenzamide

## 2023-01-15 NOTE — H&P (Addendum)
History and Physical    Patient: Susan [name] VWU:[id number] DOB: 19-Apr-1951 DOA: 01/15/2023 DOS: the patient was seen and examined on 01/15/2023 PCP: Donita Brooks, MD  Patient coming from: Home  Chief Complaint:  Chief Complaint  Patient presents with   Abdominal Pain   HPI: Susan [name] is a 72 y.o. female with medical history significant of hypertension, paroxysmal atrial fibrillation, ESRD on HD (TTS), and hypothyroid who presents with complaints of abdominal pain with nausea, vomiting, and diarrhea.  Symptoms started yesterday afternoon after she had eaten some watermelon.  She complained of having crampy lower abdominal pain that would come and go.  Noted associated symptoms of feeling hot and urinary frequency.  At baseline patient has been wheelchair-bound, but reports having a prosthetic limb that is coming.  She is not sure of her medication regimen and notes that her husband gives her her insulin.  He current regimen includes 26 units of long-acting insulin if blood sugars are greater than 313 units of blood sugars are less than 300.  She is on a sliding scale of short acting insulin that ranges from 5 to 18 units.  She normally is on 2 L of oxygen just at night.  In the emergency department patient was noted to be afebrile with blood pressures elevated up to 192/95, and placed on 2 L of nasal cannula oxygen seems for comfort and was not noted to be hypoxic.  Initial labs significant for glucose 687, anion gap 19, sodium 124, CO2 to 20, BUN 52,  creatinine 4.27, and beta hydroxybutyrate 1.85.  Venous pH was within normal limits at 7.32.  CT scan of the abdomen pelvis noted anasarca with layering pleural effusions and evidence of interstitial pulmonary edema and body wall edema.  Nephrology have been consulted.  Initially patient had been given 1 L of lactated Ringer's, NovoLog 10 units, Compazine 10 units, and Benadryl 10 units.  Review of Systems: As mentioned in the history of  present illness. All other systems reviewed and are negative. Past Medical History:  Diagnosis Date   Anemia    hx   Anxiety    Arthritis    "generalized" (03/15/2014)   CAD (coronary artery disease)    MI in 2000 - MI  2007 - treated bare metal stent (no nuclear since then as 9/11)   Carotid artery disease (HCC)    Chronic diastolic heart failure (HCC)    a) ECHO (08/2013) EF 55-60% and RV function nl b) RHC (08/2013) RA 4, RV 30/5/7, PA 25/10 (16), PCWP 7, Fick CO/CI 6.3/2.7, PVR 1.5 WU, PA 61 and 66%   Daily headache    "~ every other day; since I fell in June" (03/15/2014)   Depression    Diabetic retinopathy (HCC)    Dyslipidemia    ESRD (end stage renal disease) (HCC)    Dialysis on Tues Thurs Sat   Exertional shortness of breath    History of kidney stones    HTN (hypertension)    Hypothyroidism    Obesity    Osteoarthritis    PAF (paroxysmal atrial fibrillation) (HCC)    Peripheral neuropathy    bilateral feet/hands   PONV (postoperative nausea and vomiting)    RBBB (right bundle branch block)    Old   Stroke (HCC)    mini strokes   Type II diabetes mellitus (HCC)    Type II, [name]. patient has omnipod insulin pump with Novolin R Insulin  Past Surgical History:  Procedure Laterality Date   A/V FISTULAGRAM Left 11/07/2022   Procedure: A/V Fistulagram;  Surgeon: Leonie Douglas, MD;  Location: [facility] INVASIVE CV LAB;  Service: Cardiovascular;  Laterality: Left;   ABDOMINAL HYSTERECTOMY  1980's   AMPUTATION Right 02/24/2018   Procedure: RIGHT FOOT GREAT TOE AND 2ND TOE AMPUTATION;  Surgeon: Nadara Mustard, MD;  Location: [facility] OR;  Service: Orthopedics;  Laterality: Right;   AMPUTATION Right 04/30/2018   Procedure: RIGHT TRANSMETATARSAL AMPUTATION;  Surgeon: Nadara Mustard, MD;  Location: [facility] OR;  Service: Orthopedics;  Laterality: Right;   AMPUTATION Right 05/02/2022   Procedure: RIGHT BELOW KNEE AMPUTATION;  Surgeon: Nadara Mustard, MD;  Location: [facility]  OR;  Service: Orthopedics;  Laterality: Right;   APPLICATION OF WOUND VAC Right 06/13/2022   Procedure: APPLICATION OF WOUND VAC;  Surgeon: Nadara Mustard, MD;  Location: [facility] OR;  Service: Orthopedics;  Laterality: Right;   APPLICATION OF WOUND VAC Left 11/14/2022   Procedure: APPLICATION OF WOUND VAC;  Surgeon: Nadara Mustard, MD;  Location: [facility] OR;  Service: Orthopedics;  Laterality: Left;   AV FISTULA PLACEMENT Left 04/02/2022   Procedure: LEFT ARM ARTERIOVENOUS (AV) FISTULA CREATION;  Surgeon: Nada Libman, MD;  Location: [facility] OR;  Service: Vascular;  Laterality: Left;  PERIPHERAL NERVE BLOCK   BASCILIC VEIN TRANSPOSITION Left 07/31/2022   Procedure: LEFT ARM SECOND STAGE BASILIC VEIN TRANSPOSITION;  Surgeon: Nada Libman, MD;  Location: [facility] OR;  Service: Vascular;  Laterality: Left;   BIOPSY  05/27/2020   Procedure: BIOPSY;  Surgeon: Lanelle Bal, DO;  [facility]: [facility] ENDO SUITE;  Service: Endoscopy;;   CATARACT EXTRACTION, BILATERAL Bilateral ?2013   COLONOSCOPY W/ POLYPECTOMY     COLONOSCOPY WITH PROPOFOL N/A 03/13/2019   Procedure: COLONOSCOPY WITH PROPOFOL;  Surgeon: Beverley Fiedler, MD;  Location: [facility] ENDOSCOPY;  Service: Gastroenterology;  Laterality: N/A;   CORONARY ANGIOPLASTY WITH STENT PLACEMENT  1999; 2007   "1 + 1"   ERCP N/A 02/03/2022   Procedure: ENDOSCOPIC RETROGRADE CHOLANGIOPANCREATOGRAPHY (ERCP);  Surgeon: Jeani Hawking, MD;  Location: [facility] ENDOSCOPY;  Service: Gastroenterology;  Laterality: N/A;   ESOPHAGOGASTRODUODENOSCOPY (EGD) WITH PROPOFOL N/A 03/13/2019   Procedure: ESOPHAGOGASTRODUODENOSCOPY (EGD) WITH PROPOFOL;  Surgeon: Beverley Fiedler, MD;  Location: [facility] ENDOSCOPY;  Service: Gastroenterology;  Laterality: N/A;   ESOPHAGOGASTRODUODENOSCOPY (EGD) WITH PROPOFOL N/A 05/27/2020   Procedure: ESOPHAGOGASTRODUODENOSCOPY (EGD) WITH PROPOFOL;  Surgeon: Lanelle Bal, DO;  [facility];  Service: Endoscopy;  Laterality: N/A;   ESOPHAGOGASTRODUODENOSCOPY (EGD) WITH  PROPOFOL N/A 09/03/2022   Procedure: ESOPHAGOGASTRODUODENOSCOPY (EGD) WITH PROPOFOL;  Surgeon: Jeani Hawking, MD;  Location: [facility] ENDOSCOPY;  Service: Gastroenterology;  Laterality: N/A;   EYE SURGERY Bilateral    lazer   FLEXIBLE SIGMOIDOSCOPY N/A 05/23/2022   Procedure: FLEXIBLE SIGMOIDOSCOPY;  Surgeon: Jeani Hawking, MD;  Location: [facility] ENDOSCOPY;  Service: Gastroenterology;  Laterality: N/A;   FLEXIBLE SIGMOIDOSCOPY N/A 05/24/2022   Procedure: FLEXIBLE SIGMOIDOSCOPY;  Surgeon: Imogene Burn, MD;  Location: [facility] ENDOSCOPY;  Service: Gastroenterology;  Laterality: N/A;   FLEXIBLE SIGMOIDOSCOPY N/A 09/03/2022   Procedure: FLEXIBLE SIGMOIDOSCOPY;  Surgeon: Jeani Hawking, MD;  Location: [facility] ENDOSCOPY;  Service: Gastroenterology;  Laterality: N/A;   HEMOSTASIS CLIP PLACEMENT  03/13/2019   Procedure: HEMOSTASIS CLIP PLACEMENT;  Surgeon: Beverley Fiedler, MD;  Location: [facility] ENDOSCOPY;  Service: Gastroenterology;;   HEMOSTASIS CLIP PLACEMENT  05/23/2022   Procedure: HEMOSTASIS CLIP PLACEMENT;  Surgeon: Jeani Hawking, MD;  Location: [facility] ENDOSCOPY;  Service: Gastroenterology;;   HEMOSTASIS CONTROL  05/24/2022   Procedure: HEMOSTASIS CONTROL;  Surgeon: Imogene Burn, MD;  Location: [facility] ENDOSCOPY;  Service: Gastroenterology;;   HOT HEMOSTASIS N/A 05/23/2022   Procedure: HOT HEMOSTASIS (ARGON PLASMA COAGULATION/BICAP);  Surgeon: Jeani Hawking, MD;  Location: [facility] ENDOSCOPY;  Service: Gastroenterology;  Laterality: N/A;   I & D EXTREMITY Left 05/05/2022   Procedure: IRRIGATION AND DEBRIDEMENT LEFT ARM AV FISTULA;  Surgeon: Cephus Shelling, MD;  Location: [facility] OR;  Service: Vascular;  Laterality: Left;   I & D EXTREMITY N/A 11/14/2022   Procedure: IRRIGATION AND DEBRIDEMENT OF LOWER EXTREMITY WOUND;  Surgeon: Nadara Mustard, MD;  Location: [facility] OR;  Service: Orthopedics;  Laterality: N/A;   INSERTION OF DIALYSIS CATHETER Right 04/02/2022   Procedure: INSERTION OF TUNNELED DIALYSIS CATHETER;  Surgeon: Nada Libman,  MD;  Location: [facility] OR;  Service: Vascular;  Laterality: Right;   KNEE ARTHROSCOPY Left 10/25/2006   POLYPECTOMY  03/13/2019   Procedure: POLYPECTOMY;  Surgeon: Beverley Fiedler, MD;  Location: [facility] ENDOSCOPY;  Service: Gastroenterology;;   REMOVAL OF STONES  02/03/2022   Procedure: REMOVAL OF STONES;  Surgeon: Jeani Hawking, MD;  Location: [facility] ENDOSCOPY;  Service: Gastroenterology;;   REVISON OF ARTERIOVENOUS FISTULA Left 08/20/2022   Procedure: REVISON OF LEFT ARM ARTERIOVENOUS FISTULA;  Surgeon: Nada Libman, MD;  Location: [facility] OR;  Service: Vascular;  Laterality: Left;   RIGHT HEART CATH N/A 07/24/2017   Procedure: RIGHT HEART CATH;  Surgeon: Dolores Patty, MD;  [facility] INVASIVE CV LAB;  Service: Cardiovascular;  Laterality: N/A;   RIGHT HEART CATHETERIZATION N/A 09/22/2013   Procedure: RIGHT HEART CATH;  Surgeon: Dolores Patty, MD;  Location: [facility] CATH LAB;  Service: Cardiovascular;  Laterality: N/A;   SHOULDER ARTHROSCOPY WITH OPEN ROTATOR CUFF REPAIR Right 03/14/2014   Procedure: RIGHT SHOULDER ARTHROSCOPY WITH BICEPS RELEASE, OPEN SUBSCAPULA REPAIR, OPEN SUPRASPINATUS REPAIR.;  Surgeon: Cammy Copa, MD;  Location: [facility] OR;  Service: Orthopedics;  Laterality: Right;   SPHINCTEROTOMY  02/03/2022   Procedure: SPHINCTEROTOMY;  Surgeon: Jeani Hawking, MD;  Location[facility] ENDOSCOPY;  Service: Gastroenterology;;   STUMP REVISION Right 06/13/2022   Procedure: REVISION RIGHT BELOW KNEE AMPUTATION;  Surgeon: Nadara Mustard, MD;  Location: [facility] OR;  Service: Orthopedics;  Laterality: Right;   TEE WITHOUT CARDIOVERSION N/A 02/04/2022   Procedure: TRANSESOPHAGEAL ECHOCARDIOGRAM (TEE);  Surgeon: Dolores Patty, MD;  Location: [facility] ENDOSCOPY;  Service: Cardiovascular;  Laterality: N/A;   THROMBECTOMY W/ EMBOLECTOMY Left 08/20/2022   Procedure: THROMBECTOMY OF LEFT ARM ARTERIOVENOUS FISTULA;  Surgeon: Nada Libman, MD;  Location: [facility] OR;  Service: Vascular;  Laterality: Left;   TOE AMPUTATION  Right 02/24/2018   GREAT TOE AND 2ND TOE AMPUTATION   TUBAL LIGATION  1970's   Social History:  reports that she quit smoking about 25 years ago. Her smoking use included cigarettes. She started smoking about 57 years ago. She has a 96 pack-year smoking history. She has never used smokeless tobacco. She reports that she does not currently use alcohol. She reports that she does not use drugs.  Allergies  Allergen Reactions   Cephalexin Diarrhea and Other (See Comments)   Codeine Nausea And Vomiting and Other (See Comments)    Family History  Problem Relation Age of Onset   Heart attack Mother 64    Prior to Admission medications   Medication Sig Start Date End Date Taking? Authorizing Provider  acetaminophen (TYLENOL) 325 MG tablet Take 2  tablets (650 mg total) by mouth every 6 (six) hours as needed for mild pain (or Fever >/= 101). 12/10/22  Yes Angiulli, Mcarthur Rossetti, PA-C  albuterol (VENTOLIN HFA) 108 (90 Base) MCG/ACT inhaler Inhale 1-2 puffs into the lungs every 6 (six) hours as needed for wheezing or shortness of breath. 12/11/22  Yes Angiulli, Mcarthur Rossetti, PA-C  amiodarone (PACERONE) 200 MG tablet Take 1 tablet (200 mg total) by mouth daily. 12/11/22  Yes Angiulli, Mcarthur Rossetti, PA-C  apixaban (ELIQUIS) 5 MG TABS tablet Take 1 tablet (5 mg total) by mouth 2 (two) times daily. 12/11/22  Yes Angiulli, Mcarthur Rossetti, PA-C  ascorbic acid (VITAMIN C) 500 MG tablet Take 1 tablet (500 mg total) by mouth daily. 12/11/22  Yes Angiulli, Mcarthur Rossetti, PA-C  atorvastatin (LIPITOR) 80 MG tablet TAKE 1 TABLET BY MOUTH EVERY DAY 12/26/22  Yes Bensimhon, Bevelyn Buckles, MD  Calcium Carbonate Antacid (CALCIUM CARBONATE, DOSED IN MG ELEMENTAL CALCIUM,) 1250 MG/5ML SUSP Take 5 mLs (500 mg of elemental calcium total) by mouth every 6 (six) hours as needed for indigestion. 12/10/22  Yes Angiulli, Mcarthur Rossetti, PA-C  carvedilol (COREG) 6.25 MG tablet Take 6.25 mg by mouth 2 (two) times daily with a meal. 08/11/22  Yes [provider]   clonazePAM (KLONOPIN) 0.5 MG disintegrating tablet Take 1 tablet (0.5 mg total) by mouth 2 (two) times daily. 12/29/22  Yes Donita Brooks, MD  folic acid (FOLVITE) 1 MG tablet Take 1 tablet (1 mg total) by mouth daily. 01/09/23  Yes Donita Brooks, MD  insulin aspart (NOVOLOG FLEXPEN) 100 UNIT/ML FlexPen Inject 0-15 Units into the skin See admin instructions. Inject 12 units subcutaneously 3 times daily with meals (0900, 1300 & 1700) & as needed via sliding scale insulin CBG <70=Notify NP/MD 71-149=0 units,150-199=4 units, 200-249=6 units,250- 299=10 units, 300-349=12 units, 350-399=15 units, > 400 call MD ( prime pen with 2 units prior  to set dose) Patient taking differently: Inject 0-16 Units into the skin 3 (three) times daily with meals. 09/05/22  Yes Leroy Sea, MD  insulin glargine (LANTUS) 100 UNIT/ML Solostar Pen Inject 12 units into the skin at bedtime (Max 40 units) 12/11/22  Yes Angiulli, Mcarthur Rossetti, PA-C  levothyroxine (SYNTHROID) 75 MCG tablet Take 1 tablet (75 mcg total) by mouth daily before breakfast. 12/11/22  Yes Angiulli, Mcarthur Rossetti, PA-C  methocarbamol (ROBAXIN) 750 MG tablet Take 1 tablet (750 mg total) by mouth every 6 (six) hours as needed for muscle spasms. 12/11/22  Yes Angiulli, Mcarthur Rossetti, PA-C  metoCLOPramide (REGLAN) 5 MG tablet Take 1 tablet (5 mg total) by mouth 3 (three) times daily before meals. 01/09/23  Yes Donita Brooks, MD  midodrine (PROAMATINE) 10 MG tablet Take 1 tablet (10 mg total) by mouth Every Tuesday,Thursday,and Saturday with dialysis. 12/11/22  Yes Angiulli, Mcarthur Rossetti, PA-C  pantoprazole (PROTONIX) 40 MG tablet Take 1 tablet (40 mg total) by mouth 2 (two) times daily before a meal. 01/09/23 01/09/24 Yes [name], Priscille Heidelberg, MD  polyethylene glycol (MIRALAX / GLYCOLAX) 17 g packet Take 17 g by mouth daily. 12/10/22  Yes Angiulli, Mcarthur Rossetti, PA-C  ramelteon (ROZEREM) 8 MG tablet Take 1 tablet (8 mg total) by mouth at bedtime. 12/11/22  Yes Angiulli, Mcarthur Rossetti,  PA-C  senna-docusate (SENOKOT-S) 8.6-50 MG tablet Take 2 tablets by mouth at bedtime. Patient taking differently: Take 2 tablets by mouth at bedtime as needed for mild constipation. 12/10/22  Yes Angiulli, Mcarthur Rossetti, PA-C  sevelamer carbonate (RENVELA)  800 MG tablet Take 2 tablets (1,600 mg total) by mouth 3 (three) times daily with meals. (0800, 1200 & 1700) 12/11/22  Yes Angiulli, Mcarthur Rossetti, PA-C  venlafaxine XR (EFFEXOR-XR) 75 MG 24 hr capsule Take 1 capsule (75 mg total) by mouth daily with breakfast. Stop trintellix 01/05/23  Yes Donita Brooks, MD  Zinc Sulfate 220 (50 Zn) MG TABS Take 1 tablet (220 mg total) by mouth daily. 12/11/22  Yes Angiulli, Mcarthur Rossetti, PA-C  insulin glargine (LANTUS) 100 UNIT/ML Solostar Pen Inject 12 Units into the skin at bedtime. Patient not taking: Reported on 01/15/2023 12/11/22   Charlton Amor, PA-C    Physical Exam: Vitals:   01/15/23 0545 01/15/23 0600 01/15/23 0615 01/15/23 0745  BP: (!) 159/94 (!) 124/91 (!) 192/95 (!) 172/90  Pulse: 79 80 81 80  Resp:    20  Temp:    97.7 F (36.5 C)  TempSrc:    Oral  SpO2: 100% 100% 96% 97%    Constitutional: Elderly female who appears to be in no acute distress Eyes: PERRL, lids and conjunctivae normal ENMT: Mucous membranes are moist. Posterior pharynx clear of any exudate or lesions.  Neck: normal, supple Respiratory: Normal respiratory effort with crackles heard in the lower lung fields.  Patient on 2 L of nasal cannula oxygen currently with O2 saturations maintained. Cardiovascular: Regular rate and rhythm, no murmurs / rubs / gallops.  Lower extremity edema present. Abdomen: no tenderness, no masses palpated. No hepatosplenomegaly. Bowel sounds positive.  Musculoskeletal: no clubbing / cyanosis.  Below-knee amputation of the right leg. Skin: no rashes, lesions, ulcers. No induration Neurologic: CN 2-12 grossly intact.   Strength 5/5 in all 4.  Psychiatric: Normal judgment and insight. Alert and oriented  x 3. Normal mood.    Data Reviewed:   EKG revealed normal sinus rhythm at 66 bpm with first-degree heart block and RBBB  Assessment and Plan:  Uncontrolled diabetes mellitus type 2 with hyperglycemia, with long-term use of insulin On admission patient's blood sugar was noted to be elevated at 692 with CO2 of 20 and anion gap of 19.  Initial venous blood gas was within normal limits at 7.32.  Last hemoglobin A1c was 7.9 back in 08/27/2022.  Patient had initially been given 1 L of IV fluids and 10 units of insulin.  Patient had given concern for initial signs of early DKA.  Repeat anion gap had closed on repeat check -Admit to a progressive bed -Hyperglycemia order set utilized with insulin drip -IV fluids were held as patient appeared to be fluid overloaded and is on dialysis -Check hemoglobin A1c -Check urinalysis -Serial BMPs -Plan to transition to subcu insulin once anion gap corrected for 2 separate checks  Nausea, vomiting, diarrhea Question proctitis Acute.  Patient reported having acute onset of lower abdominal pain with nausea, vomiting, and diarrhea starting yesterday.  CT scan of the abdomen pelvis did make note of nonspecific circumferential rectal wall thickening concerning for proctitis.  Patient has history of proctocolitis that was treated during hospitalization in April of this year.  Records also note prior history of C. difficile, but was thought to be likely colonized during last hospitalization.  Unclear if at this time if nausea, vomiting, and diarrhea symptoms are secondary to uncontrolled diabetes versus infection. -Strict intake and output -Check C. difficile if diarrhea persist -Follow-up urinalysis, if able to be obtained -May warrant further investigation if symptoms persist.  Fluid overload in setting of ESRD on HD Patient is  due for hemodialysis today.  Follows a TTS schedule.  Potassium was noted to be within normal limits at 3.7.  CT imaging noted generalized  anasarca. -Nephrology consulted for need of HD.  Paroxysmal atrial fibrillation on chronic anticoagulation Patient currently appears to be in a sinus rhythm. -Continue Coreg and Eliquis  Essential hypertension On admission blood pressures elevated up to 192/95. Patient on blood pressure medications at home. -Continue to monitor    Anemia of chronic disease Hemoglobin CT scan of the with low MCV and MCH.  No reports of bleeding.  Hypothyroidism TSH was elevated at 5.58 on 10/08/2022. -Recheck TSH (4.435) -Continue levothyroxine  Hyperlipidemia -Continue atorvastatin  GERD -Continue Protonix and Reglan  DVT prophylaxis: Eliquis Advance Care Planning:   Code Status: Full Code    Consults: Nephrology  Family Communication: Husband updated over the phone Severity of Illness: The appropriate patient status for this patient is INPATIENT. Inpatient status is judged to be reasonable and necessary in order to provide the required intensity of service to ensure the patient's safety. The patient's presenting symptoms, physical exam findings, and initial radiographic and laboratory data in the context of their chronic comorbidities is felt to place them at high risk for further clinical deterioration. Furthermore, it is not anticipated that the patient will be medically stable for discharge from the hospital within 2 midnights of admission.   * I certify that at the point of admission it is my clinical judgment that the patient will require inpatient hospital care spanning beyond 2 midnights from the point of admission due to high intensity of service, high risk for further deterioration and high frequency of surveillance required.*  Author: Clydie Braun, MD 01/15/2023 9:45 AM  For on call review [email or web address].

## 2023-01-15 NOTE — ED Notes (Signed)
Patient transported to CT 

## 2023-01-16 ENCOUNTER — Ambulatory Visit (HOSPITAL_COMMUNITY): Payer: PPO

## 2023-01-16 DIAGNOSIS — E1165 Type 2 diabetes mellitus with hyperglycemia: Secondary | ICD-10-CM | POA: Diagnosis not present

## 2023-01-16 DIAGNOSIS — Z794 Long term (current) use of insulin: Secondary | ICD-10-CM | POA: Diagnosis not present

## 2023-01-16 LAB — RENAL FUNCTION PANEL
Albumin: 2.4 g/dL — ABNORMAL LOW (ref 3.5–5.0)
Anion gap: 10 (ref 5–15)
BUN: 28 mg/dL — ABNORMAL HIGH (ref 8–23)
CO2: 28 mmol/L (ref 22–32)
Calcium: 8.7 mg/dL — ABNORMAL LOW (ref 8.9–10.3)
Chloride: 95 mmol/L — ABNORMAL LOW (ref 98–111)
Creatinine, Ser: 2.7 mg/dL — ABNORMAL HIGH (ref 0.44–1.00)
GFR, Estimated: 18 mL/min — ABNORMAL LOW (ref >60)
Glucose, Bld: 170 mg/dL — ABNORMAL HIGH (ref 70–99)
Phosphorus: 3.4 mg/dL (ref 2.5–4.6)
Potassium: 3.4 mmol/L — ABNORMAL LOW (ref 3.5–5.1)
Sodium: 133 mmol/L — ABNORMAL LOW (ref 135–145)

## 2023-01-16 LAB — GLUCOSE, CAPILLARY
Glucose-Capillary: 132 mg/dL — ABNORMAL HIGH (ref 70–99)
Glucose-Capillary: 139 mg/dL — ABNORMAL HIGH (ref 70–99)
Glucose-Capillary: 196 mg/dL — ABNORMAL HIGH (ref 70–99)
Glucose-Capillary: 303 mg/dL — ABNORMAL HIGH (ref 70–99)
Glucose-Capillary: 328 mg/dL — ABNORMAL HIGH (ref 70–99)
Glucose-Capillary: 398 mg/dL — ABNORMAL HIGH (ref 70–99)

## 2023-01-16 LAB — CBC
HCT: 33.1 % — ABNORMAL LOW (ref 36.0–46.0)
Hemoglobin: 10.1 g/dL — ABNORMAL LOW (ref 12.0–15.0)
MCH: 23.1 pg — ABNORMAL LOW (ref 26.0–34.0)
MCHC: 30.5 g/dL (ref 30.0–36.0)
MCV: 75.6 fL — ABNORMAL LOW (ref 80.0–100.0)
Platelets: 252 10*3/uL (ref 150–400)
RBC: 4.38 MIL/uL (ref 3.87–5.11)
RDW: 17.5 % — ABNORMAL HIGH (ref 11.5–15.5)
WBC: 10.4 10*3/uL (ref 4.0–10.5)
nRBC: 0 % (ref 0.0–0.2)

## 2023-01-16 LAB — HEPATITIS B SURFACE ANTIBODY, QUANTITATIVE: Hep B S AB Quant (Post): 2488 m[IU]/mL

## 2023-01-16 MED ORDER — INSULIN ASPART 100 UNIT/ML IJ SOLN
15.0000 [IU] | Freq: Once | INTRAMUSCULAR | Status: AC
  Administered 2023-01-16: 15 [IU] via SUBCUTANEOUS

## 2023-01-16 MED ORDER — INSULIN ASPART 100 UNIT/ML IJ SOLN: 4.0000 [IU] | Freq: Three times a day (TID) | INTRAMUSCULAR | Status: DC

## 2023-01-16 MED ORDER — INSULIN GLARGINE-YFGN 100 UNIT/ML ~~LOC~~ SOLN
12.0000 [IU] | Freq: Every day | SUBCUTANEOUS | Status: DC
  Administered 2023-01-16: 12 [IU] via SUBCUTANEOUS
  Filled 2023-01-16 (×2): qty 0.12

## 2023-01-16 MED ORDER — INSULIN ASPART 100 UNIT/ML IJ SOLN: 0.0000 [IU] | Freq: Three times a day (TID) | INTRAMUSCULAR | Status: DC

## 2023-01-16 MED ORDER — INSULIN ASPART 100 UNIT/ML IJ SOLN
0.0000 [IU] | Freq: Three times a day (TID) | INTRAMUSCULAR | Status: DC
  Administered 2023-01-17: 2 [IU] via SUBCUTANEOUS
  Administered 2023-01-17: 5 [IU] via SUBCUTANEOUS

## 2023-01-16 MED ORDER — INSULIN ASPART 100 UNIT/ML IJ SOLN
4.0000 [IU] | Freq: Three times a day (TID) | INTRAMUSCULAR | Status: DC
  Administered 2023-01-17: 4 [IU] via SUBCUTANEOUS

## 2023-01-16 NOTE — Assessment & Plan Note (Signed)
BP soft - Continue home midodrine

## 2023-01-16 NOTE — Hospital Course (Addendum)
Susan [name] is a 72 y.o. F with sCHF EF 30-35%, ESRD on HD TThS, PVD s/p BKA, wheelchair limited mostly, hypothyroidism, morbid obesity, CAD DM and Afib on Eliquis who presented with N/V/D.  CT abdomen unremarkable.  Glucose >600 and anion gap elevated.  Admitted on insulin drip.

## 2023-01-16 NOTE — Assessment & Plan Note (Signed)
Hgb stable - Aranesp per Nephrology

## 2023-01-16 NOTE — Assessment & Plan Note (Signed)
-   Continue Coreg, midodrine - Fluid status per HD

## 2023-01-16 NOTE — Assessment & Plan Note (Signed)
Given insulin drip during day yesterday, glucoses controlled  Transitioned to subCu last night, but glucoses trending up today. - Continue glargine, increase dose - Continue SS corrections and mealtime, increase dose

## 2023-01-16 NOTE — Assessment & Plan Note (Signed)
Continue levothyroxine 

## 2023-01-16 NOTE — Progress Notes (Signed)
  Progress Note   Patient: Susan ORRIS [name]:[id number] DOB: November 06, 1950 DOA: 01/15/2023     1 DOS: the patient was seen and examined on 01/16/2023 at 11:47AM      Brief hospital course: Mrs. Poage is a 72 y.o. F with sCHF EF 30-35%, ESRD on HD TThS, PVD s/p BKA, wheelchair limited mostly, hypothyroidism, morbid obesity, CAD DM and Afib on Eliquis who presented with N/V/D.  CT abdomen unremarkable.  Glucose >600 and anion gap elevated.  Admitted on insulin drip.     Assessment and Plan: * Uncontrolled type 2 diabetes mellitus with hyperglycemia, with long-term current use of insulin (HCC) Given insulin drip during day yesterday, glucoses controlled  Transitioned to subCu last night, but glucoses trending up today. - Continue glargine, increase dose - Continue SS corrections and mealtime, increase dose    Proctitis Diarrhea improving on antibiotics.  CT showed proctitis.  No further stools, doubt Cdiff. - Continue Rocephin and Flagyl  ESRD on dialysis [facility]) - Consult Nephrology for routine HD  Paroxysmal atrial fibrillation (HCC) - Continue Coreg, amiodarone - Continue apixaban  Chronic hypotension BP soft - Continue home midodrine   Anemia of chronic disease Hgb stable - Aranesp per Nephrology  Hypothyroidism - Continue levothyroxine  Right below-knee amputee (HCC)    PVD (peripheral vascular disease) (HCC)    Obesity BMI 30  Chronic HFrEF (heart failure with reduced ejection fraction) (HCC) - Continue Coreg, midodrine - Fluid status per HD  Hyponatremia Mild, asymptoamtic          Subjective: Feeling better.  Stools stopped.  No bleeding.  Nasuea, resolved.     Physical Exam: BP (!) 95/39 (BP Location: Right Arm)   Pulse 77   Temp (!) 100.5 F (38.1 C) (Axillary)   Resp 15   Wt 83.9 kg   SpO2 96%   BMI 30.78 kg/m   Elderly adult female, lying in bed, interactive and appropriate RRR, no murmurs, no peripheral edema Respiratory rate  normal, lungs clear without rales or wheezes Abdomen soft no tenderness palpation Right BKA, attention normal, affect normal, judgment insight appear normal    Data Reviewed: CBC and BMP reviewed  Family Communication: At the bedside    Disposition: Status is: Inpatient The patient was admitted for hyperglycemic crisis and proctitis  She will continue antibiotics for the proctitis, can probably be switched to a short course of oral or even stopped at discharge, likely tomorrow        Author: Alberteen Sam, MD 01/16/2023 6:21 PM  For on call review [email or web address].

## 2023-01-16 NOTE — Assessment & Plan Note (Signed)
BMI >30 

## 2023-01-16 NOTE — Progress Notes (Signed)
Received patient at bedside.  Alert and oriented.  Informed consent signed and in chart.   TX duration: 3.5 hours  Patient tolerated well.  Patient remains stable post treatment.  Alert, without acute distress.  Hand-off given to patient's nurse.   Access used: RIJ TDC Access issues: None  Total UF removed: 3500 mL Medication(s) given: Heparin bolus 4000 units Post HD VS: please see Data insert Post HD weight: Unable to obtain    01/16/23 0115  Vitals  Temp 97.9 F (36.6 C)  Temp Source Axillary  BP (!) 111/58  MAP (mmHg) 73  BP Location Right Wrist  BP Method Automatic  Patient Position (if appropriate) Lying  Pulse Rate 72  Pulse Rate Source Monitor  ECG Heart Rate 72  Resp 15  Oxygen Therapy  SpO2 100 %  O2 Device Nasal Cannula  O2 Flow Rate (L/min) 2 L/min  Patient Activity (if Appropriate) In bed  Pulse Oximetry Type Continuous  During Treatment Monitoring  Blood Flow Rate (mL/min) 0 mL/min  Arterial Pressure (mmHg) 0 mmHg  Venous Pressure (mmHg) 0 mmHg  TMP (mmHg) 15.55 mmHg  Ultrafiltration Rate (mL/min) 1194 mL/min  Dialysate Flow Rate (mL/min) 300 ml/min  Dialysate Potassium Concentration 3  Dialysate Calcium Concentration 2.5  Duration of HD Treatment -hour(s) 3.5 hour(s)  Cumulative Fluid Removed (mL) per Treatment  3499.38  Intra-Hemodialysis Comments Tolerated well  Post Treatment  Dialyzer Clearance Lightly streaked  Hemodialysis Intake (mL) 0 mL  Liters Processed 84  Fluid Removed (mL) 3500 mL  Tolerated HD Treatment Yes  Post-Hemodialysis Comments Treatment completed without issue.  Note  Patient Observations Patient resting, easily startles from sleep, no c/o voiced, no acute distress noted, VSS, condition stable for this treatment d/c.  Hemodialysis Catheter Right Subclavian  Placement Date: 06/06/22   Placed prior to admission: Yes  Orientation: Right  Access Location: Subclavian  Site Condition No complications  Blue Lumen Status  Flushed;Heparin locked;Dead end cap in place  Red Lumen Status Flushed;Heparin locked;Dead end cap in place  Purple Lumen Status N/A  Catheter fill solution Heparin 1000 units/ml  Catheter fill volume (Arterial) 1.9 cc  Catheter fill volume (Venous) 1.9  Dressing Type Transparent  Dressing Status Antimicrobial disc in place;Clean, Dry, Intact  Drainage Description None  Post treatment catheter status Capped and Clamped      [facility]

## 2023-01-16 NOTE — Plan of Care (Signed)
  Problem: Pain Managment: Goal: General experience of comfort will improve Outcome: Completed/Met

## 2023-01-16 NOTE — Progress Notes (Signed)
Subjective: Patient seen in room = feels better tolerated lunch,, abdominal pain resolving, said tolerated dialysis yesterday evening scheduled 3.5 L UF  Objective Vital signs in last 24 hours: Vitals:   01/16/23 0106 01/16/23 0115 01/16/23 0147 01/16/23 0507  BP:  (!) 111/58 (!) 115/37 (!) 119/43  Pulse: 66 72 67 73  Resp: 14 15 16    Temp:  97.9 F (36.6 C)  99.2 F (37.3 C)  TempSrc:  Axillary  Axillary  SpO2: 100% 100% 100%   Weight:    83.9 kg   Weight change:   Physical Exam: General: Alert obese elderly female NAD currently room air oxygen Heart: RRR no MRG Lungs: CTA bilaterally nonlabored breathing Abdomen: Obese, NABS, soft NTND Extremities: Bilateral hip edema 1+, right lower leg Unna boot wrapped clean dry, 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  CT abdomen= anasarca, moderate global effusions with pulmonary social edema, wall edema left pleural effusion, mesenteric edema, cholelithiasis without acute cholecystitis hepatic steatosis , w/u  Per pmd.,  Today symptomatic improving DM2/ HHS - per pmd, please do not give IVF"s with the protocol, only IV insulin and if needs BS support use D5W 50- 75 cc/hr (not LR or NS).  ESRD - on HD TTS.  She was scheduled yesterday Thursday 822 has not missed OP HD.  Volume overload- mild-mod edema /and noted significant findings on CT scan pulmonary edema, anasarca, tolerated yesterday HD UF 3 L /[place] max UF as able  HTN - is not on BP lowering meds at home. 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 has ordered 120 mcg darbe weekly on Sat while here.  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 Cont po vdra three times per week.  DM2 - on insulin   Lenny Pastel, PA-C [facility] Beeper [phone] 01/16/2023,2:23 PM  LOS: 1 day   Labs: Basic Metabolic Panel: Recent Labs  Lab 01/15/23 0813 01/15/23 1514 01/16/23 0404  NA 124* 127* 133*  K 4.0 3.7 3.4*  CL 86* 90* 95*  CO2 23 19* 28  GLUCOSE 692* 407* 170*  BUN 54* 57* 28*  CREATININE 4.34* 4.30* 2.70*  CALCIUM 8.7* 8.9 8.7*  PHOS  --  5.2* 3.4   Liver Function Tests: Recent Labs  Lab 01/15/23 0513 01/16/23 0404  AST 19  --   ALT 23  --   ALKPHOS 300*  --   BILITOT 0.8  --   PROT 7.4  --   ALBUMIN 3.0* 2.4*   Recent Labs  Lab 01/15/23 0513  LIPASE 29   No results for input(s): "AMMONIA" in the last 168 hours. CBC: Recent Labs  Lab 01/15/23 0513 01/16/23 0404  WBC 8.6 10.4  HGB 10.6* 10.1*  HCT 35.9* 33.1*  MCV 75.6* 75.6*  PLT 277 252   Cardiac Enzymes: No results for input(s): "CKTOTAL", "CKMB", "CKMBINDEX", "TROPONINI" in  the last 168 hours. CBG: Recent Labs  Lab 01/15/23 2309 01/16/23 0004 01/16/23 0039 01/16/23 0517 01/16/23 1223  GLUCAP 138* 132* 139* 196* 303*    Studies/Results: DG CHEST PORT 1 VIEW  Result Date: 01/15/2023 CLINICAL DATA:  Hypoxia, end-stage renal disease EXAM: PORTABLE CHEST 1 VIEW COMPARISON:  11/17/2022 FINDINGS: Single frontal view of the chest demonstrates stable right internal jugular dialysis catheter. The cardiac silhouette remains enlarged. Increased pulmonary vascular congestion, with developing interstitial and ground-glass opacities greatest at the lung bases consistent with volume overload and developing edema. Trace left effusion. No pneumothorax. IMPRESSION: 1. Volume overload, with developing bibasilar pulmonary edema. Electronically Signed   By: Sharlet Salina M.D.   On: 01/15/2023 17:40   CT ABDOMEN PELVIS W CONTRAST  Result Date:  01/15/2023 CLINICAL DATA:  72 year old female with lower abdominal pain, left lower quadrant pain. Vomiting and diarrhea onset yesterday. EXAM: CT ABDOMEN AND PELVIS WITH CONTRAST TECHNIQUE: Multidetector CT imaging of the abdomen and pelvis was performed using the standard protocol following bolus administration of intravenous contrast. RADIATION DOSE REDUCTION: This exam was performed according to the departmental dose-optimization program which includes automated exposure control, adjustment of the mA and/or kV according to patient size and/or use of iterative reconstruction technique. CONTRAST:  60mL OMNIPAQUE IOHEXOL 350 MG/ML SOLN COMPARISON:  CT Abdomen and Pelvis 10/07/2022. FINDINGS: Lower chest: Layering small left pleural effusion has decreased since April but not resolved. And there is a symmetric new layering small right pleural effusion. Cardiomegaly. No pericardial effusion. Lung base atelectasis and septal thickening. No lung base consolidation. Hepatobiliary: Hepatic steatosis with fatty sparing at the gallbladder fossa. Cholelithiasis. No gallbladder inflammation by CT. No bile duct enlargement. Pancreas: Negative. Spleen: Chronic small calcified splenic granulomas. 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 But there is a new posterosuperior spur small splenic pole infarct on series 6, image 59. No perisplenic fluid or inflammation. Chronic splenic scarring at the anterior superior pole is also stable such as from another prior infarct. Adrenals/Urinary Tract: Stable adrenal gland thickening such as due to hyperplasia. Nonobstructed kidneys with renal vascular calcifications and some cortical atrophy. No renal mass. Little renal contrast excretion on the delayed images. Nondilated ureters. Unremarkable bladder. Stomach/Bowel: Rectum is more decompressed compared to April but there is circumferential and somewhat irregular rectal wall thickening up to 2 cm on series 3, image  72. See also coronal image 27. The distal sigmoid is decompressed, redundant but otherwise normal. Proximal sigmoid with retained stool and diverticulosis. Similar stool and diverticulosis continuing into the distal descending colon. No active inflammation in those segments. Diverticulosis at the splenic flexure and in the transverse colon with no definite inflammation. More decompressed right colon today. Fecalith versus contrast containing appendix remains diminutive as seen on coronal images 51 through 58 and does not appear inflamed although there is regional mesenteric edema which appears dependent, generalized. No dilated small bowel. Stomach and duodenum appear negative. No free air. No free fluid. Vascular/Lymphatic: Diffuse severe calcified atherosclerosis. Aortoiliac calcified atherosclerosis. Normal caliber abdominal aorta. Major arterial structures remain patent. Grossly patent main portal vein. No lymphadenopathy identified. Reproductive: Absent uterus.  Diminutive or absent ovaries. Other: No free fluid in the pelvis. Musculoskeletal: Lower lumbar advanced disc and endplate degeneration with vacuum disc. No acute osseous abnormality identified. Body wall edema, regressed along the left lateral body wall since April and similar elsewhere. IMPRESSION: 1. Anasarca with small layering pleural effusions and evidence of lung base pulmonary interstitial edema. Body wall edema  and left pleural effusion both regressed compared to April. 2. 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 No perisplenic fluid or inflammation. No thrombus identified within the visible aorta but there is severe diffuse calcified atherosclerosis. Aortic Atherosclerosis (ICD10-I70.0). 3. Nonspecific circumferential rectal wall thickening. This most resembles Proctitis. Artifact due to under-distension or bowel edema due to #1 felt less likely. 4. Some generalized mesenteric edema suspected and related to #1. Some of this in the  region of the appendix, but the appendix appears to remain normal. 5. Cholelithiasis without CT evidence of acute cholecystitis. Hepatic steatosis. 6. Evidence of renal insufficiency. Electronically Signed   By: Odessa Fleming M.D.   On: 01/15/2023 07:50   Medications:  cefTRIAXone (ROCEPHIN)  IV 2 g (01/15/23 1859)   metronidazole 500 mg (01/16/23 0529)    amiodarone  200 mg Oral Daily   apixaban  5 mg Oral BID   atorvastatin  80 mg Oral QHS   calcitRIOL  0.75 mcg Oral Q T,Th,Sat-1800   carvedilol  6.25 mg Oral BID WC   Chlorhexidine Gluconate Cloth  6 each Topical Q0600   clonazePAM  0.5 mg Oral BID   [START ON 01/17/2023] darbepoetin (ARANESP) injection - DIALYSIS  100 mcg Subcutaneous Q Sat-1800   folic acid  1 mg Oral Daily   insulin aspart  0-5 Units Subcutaneous QHS   insulin aspart  0-6 Units Subcutaneous TID WC   insulin glargine-yfgn  8 Units Subcutaneous Q24H   levothyroxine  75 mcg Oral QAC breakfast   metoCLOPramide  5 mg Oral TID AC   midodrine  10 mg Oral Q T,Th,Sa-HD   pantoprazole  40 mg Oral BID AC   ramelteon  8 mg Oral QHS   sevelamer carbonate  1,600 mg Oral TID WC   venlafaxine XR  75 mg Oral Q breakfast

## 2023-01-16 NOTE — Assessment & Plan Note (Signed)
Diarrhea improving on antibiotics.  CT showed proctitis.  No further stools, doubt Cdiff. - Continue Rocephin and Flagyl

## 2023-01-16 NOTE — Assessment & Plan Note (Signed)
Mild, asymptoamtic

## 2023-01-16 NOTE — Assessment & Plan Note (Signed)
-   Continue Coreg, amiodarone - Continue apixaban

## 2023-01-16 NOTE — Assessment & Plan Note (Signed)
-   Consult Nephrology for routine HD 

## 2023-01-17 ENCOUNTER — Other Ambulatory Visit (HOSPITAL_COMMUNITY): Payer: Self-pay

## 2023-01-17 DIAGNOSIS — Z794 Long term (current) use of insulin: Secondary | ICD-10-CM | POA: Diagnosis not present

## 2023-01-17 DIAGNOSIS — E1165 Type 2 diabetes mellitus with hyperglycemia: Secondary | ICD-10-CM | POA: Diagnosis not present

## 2023-01-17 LAB — RENAL FUNCTION PANEL
Albumin: 2.4 g/dL — ABNORMAL LOW (ref 3.5–5.0)
Anion gap: 15 (ref 5–15)
BUN: 52 mg/dL — ABNORMAL HIGH (ref 8–23)
CO2: 21 mmol/L — ABNORMAL LOW (ref 22–32)
Calcium: 8.3 mg/dL — ABNORMAL LOW (ref 8.9–10.3)
Chloride: 91 mmol/L — ABNORMAL LOW (ref 98–111)
Creatinine, Ser: 4.08 mg/dL — ABNORMAL HIGH (ref 0.44–1.00)
GFR, Estimated: 11 mL/min — ABNORMAL LOW (ref >60)
Glucose, Bld: 177 mg/dL — ABNORMAL HIGH (ref 70–99)
Phosphorus: 4.8 mg/dL — ABNORMAL HIGH (ref 2.5–4.6)
Potassium: 3.9 mmol/L (ref 3.5–5.1)
Sodium: 127 mmol/L — ABNORMAL LOW (ref 135–145)

## 2023-01-17 LAB — CBC
HCT: 29.2 % — ABNORMAL LOW (ref 36.0–46.0)
Hemoglobin: 9.2 g/dL — ABNORMAL LOW (ref 12.0–15.0)
MCH: 23.4 pg — ABNORMAL LOW (ref 26.0–34.0)
MCHC: 31.5 g/dL (ref 30.0–36.0)
MCV: 74.1 fL — ABNORMAL LOW (ref 80.0–100.0)
Platelets: 221 10*3/uL (ref 150–400)
RBC: 3.94 MIL/uL (ref 3.87–5.11)
RDW: 17.4 % — ABNORMAL HIGH (ref 11.5–15.5)
WBC: 7 10*3/uL (ref 4.0–10.5)
nRBC: 0 % (ref 0.0–0.2)

## 2023-01-17 LAB — GLUCOSE, CAPILLARY
Glucose-Capillary: 214 mg/dL — ABNORMAL HIGH (ref 70–99)
Glucose-Capillary: 139 mg/dL — ABNORMAL HIGH (ref 70–99)

## 2023-01-17 MED ORDER — HEPARIN SODIUM (PORCINE) 1000 UNIT/ML IJ SOLN
INTRAMUSCULAR | Status: AC
  Filled 2023-01-17: qty 2

## 2023-01-17 MED ORDER — METRONIDAZOLE 500 MG PO TABS
500.0000 mg | ORAL_TABLET | Freq: Two times a day (BID) | ORAL | 0 refills | Status: AC
  Filled 2023-01-17: qty 4, 2d supply, fill #0

## 2023-01-17 MED ORDER — CEFDINIR 300 MG PO CAPS
300.0000 mg | ORAL_CAPSULE | ORAL | 0 refills | Status: DC
  Filled 2023-01-17: qty 2, 4d supply, fill #0

## 2023-01-17 MED ORDER — HEPARIN BOLUS VIA INFUSION
2000.0000 [IU] | Freq: Once | INTRAVENOUS | Status: AC
  Administered 2023-01-17: 2000 [IU] via INTRAVENOUS
  Filled 2023-01-17: qty 2000

## 2023-01-17 NOTE — TOC Transition Note (Signed)
Discharge medications are filled and stored in the [facility] awaiting patient discharge.  ** WILL NEED TO BE PICKED UP **

## 2023-01-17 NOTE — Progress Notes (Signed)
Received patient in bed to unit.  Alert and oriented.  Informed consent signed and in chart.   TX duration: 4 hours  Patient tolerated well.  Transported back to the room  Alert, without acute distress.  Hand-off given to patient's nurse.   Access used: R Chest HD Cath Access issues: none  Total UF removed: 4000 Medication(s) given: Midodrine, see MAR   01/17/23 1216  Vitals  Temp 97.7 F (36.5 C)  Temp Source Oral  BP (!) 123/47  MAP (mmHg) 69  BP Location Right Wrist  BP Method Automatic  Patient Position (if appropriate) Lying  Pulse Rate 67  Pulse Rate Source Monitor  ECG Heart Rate 67  Resp 16  MEWS COLOR  MEWS Score Color Green  Oxygen Therapy  SpO2 99 %  O2 Device Nasal Cannula  O2 Flow Rate (L/min) 2 L/min  MEWS Score  MEWS Temp 0  MEWS Systolic 0  MEWS Pulse 0  MEWS RR 0  MEWS LOC 0  MEWS Score 0     Stacie Glaze LPN [facility]

## 2023-01-17 NOTE — Progress Notes (Addendum)
Subjective: Seen on hemodialysis tolerating UF currently /reports soft stool yesterday /no complaints currently  Objective Vital signs in last 24 hours: Vitals:   01/17/23 0438 01/17/23 0755 01/17/23 0800 01/17/23 0808  BP: (!) 98/50  (!) 100/44 (!) 107/46  Pulse: 64  64 65  Resp: 16  15 17   Temp: 97.6 F (36.4 C)  97.6 F (36.4 C)   TempSrc: Oral     SpO2: 99%  99% 99%  Weight: 84.2 kg 83.4 kg 83.4 kg    Weight change: 0.3 kg   Physical Exam: General: Alert  NAD [place] O2 2 L Heart: RRR no MRG Lungs: CTA bilaterally nonlabored breathing Abdomen: Obese, NABS, soft NTND abd.wall edema Extremities: Bilat. hip edema 1+, R lower leg Unna boot wrapped clean dry, R BKA stump dressing in place not addressed Dialysis Access: R IJ TDC patent on HD     OP HD: TTS [facility]  4h  400/800   83kg  2/2 bath   RIJ TDC   Heparin 4000 + 2000 midrun - last OP HD 8/20, post wt 83.0kg   - good compliance overall - rocaltrol 0.75 mcg po three times per week - mircera 120 mcg  IV q 4 wks, last 8/10, due 8/24   Problem/Plan: Abdominal pain/proctitis- w/ N/V.  CT abdomen= anasarca, moderate global effusions with pulmonary  edema, wall edema left pleural effusion, mesenteric edema, cholelithiasis without acute cholecystitis hepatic steatosis , w/u  Per pmd.,  Symptoms resolving now denies pain DM2/ HHS -  RX per pmd, please do not give IVF"s with the protocol, only IV insulin and if needs BS support use D5W 50- 75 cc/hr (not LR or NS).  ESRD - on HD TTS.  She was scheduled yesterday Thursday 822 has not missed OP HD.  Volume overload-anasarca, pulmonary edema =CXR and CT scan ., tolerated 8/22 HD UF 3 L //UF as able today //attempt to lower EDW/current bed weight same as EDW Hyponatremia= NA 127<133<127 , with anasarca / follow-up  Na trend post UF HTN -  On midodrine 10 mg preHD /current BP stable for her  Anemia esrd - Hb 9.2 <10.1  next esa due 8/24 Sat. ordered 120 mcg darbe weekly on Sat while here.   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 Cont po vdra three times per week.  DM2 - on insulin  Lenny Pastel, PA-C [facility] [phone] 01/17/2023,8:17 AM  LOS: 2 days   Labs: Basic Metabolic Panel: Recent Labs  Lab 01/15/23 1514 01/16/23 0404 01/17/23 0427  NA 127* 133* 127*  K 3.7 3.4* 3.9  CL 90* 95* 91*  CO2 19* 28 21*  GLUCOSE 407* 170* 177*  BUN 57* 28* 52*  CREATININE 4.30* 2.70* 4.08*  CALCIUM 8.9 8.7* 8.3*  PHOS 5.2* 3.4 4.8*   Liver Function Tests: Recent Labs  Lab 01/15/23 0513 01/16/23 0404 01/17/23 0427  AST 19  --   --   ALT 23  --   --   ALKPHOS 300*  --   --   BILITOT 0.8  --   --   PROT 7.4  --   --   ALBUMIN 3.0* 2.4* 2.4*   Recent Labs  Lab 01/15/23 0513  LIPASE 29   No results for input(s): "AMMONIA" in the last 168 hours. CBC: Recent Labs  Lab 01/15/23 0513 01/16/23 0404 01/17/23 0427  WBC 8.6 10.4 7.0  HGB 10.6* 10.1* 9.2*  HCT 35.9* 33.1* 29.2*  MCV 75.6*  75.6* 74.1*  PLT 277 252 221   Cardiac Enzymes: No results for input(s): "CKTOTAL", "CKMB", "CKMBINDEX", "TROPONINI" in the last 168 hours. CBG: Recent Labs  Lab 01/16/23 0517 01/16/23 1223 01/16/23 1707 01/16/23 2128 01/17/23 0623  GLUCAP 196* 303* 398* 328* 214*    Studies/Results: DG CHEST PORT 1 VIEW  Result Date: 01/15/2023 CLINICAL DATA:  Hypoxia, end-stage renal disease EXAM: PORTABLE CHEST 1 VIEW COMPARISON:  11/17/2022 FINDINGS: Single frontal view of the chest demonstrates stable right internal jugular dialysis catheter. The cardiac silhouette remains enlarged. Increased pulmonary vascular congestion, with developing interstitial and ground-glass opacities greatest at the lung bases consistent with volume overload and developing edema. Trace left effusion. No pneumothorax. IMPRESSION: 1. Volume overload, with developing bibasilar pulmonary edema. Electronically Signed   By: Sharlet Salina M.D.   On: 01/15/2023 17:40   Medications:  cefTRIAXone  (ROCEPHIN)  IV 2 g (01/16/23 1842)   metronidazole 500 mg (01/17/23 1610)    amiodarone  200 mg Oral Daily   apixaban  5 mg Oral BID   atorvastatin  80 mg Oral QHS   calcitRIOL  0.75 mcg Oral Q T,Th,Sat-1800   carvedilol  6.25 mg Oral BID WC   Chlorhexidine Gluconate Cloth  6 each Topical Q0600   clonazePAM  0.5 mg Oral BID   darbepoetin (ARANESP) injection - DIALYSIS  100 mcg Subcutaneous Q Sat-1800   folic acid  1 mg Oral Daily   insulin aspart  0-15 Units Subcutaneous TID WC   insulin aspart  0-5 Units Subcutaneous QHS   insulin aspart  4 Units Subcutaneous TID WC   insulin glargine-yfgn  12 Units Subcutaneous QHS   levothyroxine  75 mcg Oral QAC breakfast   metoCLOPramide  5 mg Oral TID AC   midodrine  10 mg Oral Q T,Th,Sa-HD   pantoprazole  40 mg Oral BID AC   ramelteon  8 mg Oral QHS   sevelamer carbonate  1,600 mg Oral TID WC   venlafaxine XR  75 mg Oral Q breakfast

## 2023-01-17 NOTE — Discharge Summary (Signed)
Physician Discharge Summary   Patient: Susan [name] MRN: [id number] DOB: 1950-06-17  Admit date:     01/15/2023  Discharge date: 01/17/23  Discharge Physician: Alberteen Sam   PCP: Donita Brooks, MD     Recommendations at discharge:  Follow up with PCP Dr. Tanya Nones in 1 week for proctitis and diabetic crisis      Discharge Diagnoses: Principal Problem:   Uncontrolled type 2 diabetes mellitus with hyperglycemia, with long-term current use of insulin (HCC) Active Problems:   Proctitis   ESRD on dialysis (HCC)   Paroxysmal atrial fibrillation (HCC)   Chronic hypotension   Anemia of chronic disease   Hypothyroidism   Hyponatremia   Chronic HFrEF (heart failure with reduced ejection fraction) (HCC)   Obesity   PVD (peripheral vascular disease) (HCC)   Right below-knee amputee [facility])      Hospital Course: Susan [name] is a 72 y.o. F with sCHF EF 30-35%, ESRD on HD TThS, PVD s/p BKA, wheelchair limited mostly, hypothyroidism, morbid obesity, CAD DM and Afib on Eliquis who presented with N/V/D.  CT abdomen showed proctocolitis only.  Glucose >600 and anion gap elevated.  Admitted on insulin drip.      * Uncontrolled type 2 diabetes mellitus with hyperglycemia, with long-term current use of insulin (HCC) No strict acidosis on VBG, and so we are calling this diabetic crisis of hyperglycemia and elevated anion gap.  Given insulin drip and glucoses normalized, switched to sliding scale and symptoms resolved.  BMP this morning normal.  Patient tolerating diet well and asymptomatic.  Crisis likely triggered by proctocolitis.  Discharged on home insulin regimen.   Proctitis Admitted on antibiotics.  Diarrhea resolved.  Discharged to complete 5 days total cephalosporin/Flagyl.     Recommend PCP follow up.               The [employer] [facility] Controlled Substances Registry was reviewed for this patient prior to discharge.  Consultants: Nephrology    Disposition: Home Diet recommendation:  Discharge Diet Orders (From admission, onward)     Start     Ordered   01/17/23 0000  Diet - low sodium heart healthy        01/17/23 1143             DISCHARGE MEDICATION: Allergies as of 01/17/2023       Reactions   Cephalexin Diarrhea, Other (See Comments)   Codeine Nausea And Vomiting, Other (See Comments)        Medication List     TAKE these medications    acetaminophen 325 MG tablet Commonly known as: TYLENOL Take 2 tablets (650 mg total) by mouth every 6 (six) hours as needed for mild pain (or Fever >/= 101).   albuterol 108 (90 Base) MCG/ACT inhaler Commonly known as: VENTOLIN HFA Inhale 1-2 puffs into the lungs every 6 (six) hours as needed for wheezing or shortness of breath.   amiodarone 200 MG tablet Commonly known as: PACERONE Take 1 tablet (200 mg total) by mouth daily.   ascorbic acid 500 MG tablet Commonly known as: VITAMIN C Take 1 tablet (500 mg total) by mouth daily.   atorvastatin 80 MG tablet Commonly known as: LIPITOR TAKE 1 TABLET BY MOUTH EVERY DAY   calcium carbonate (dosed in mg elemental calcium) 1250 MG/5ML Susp Take 5 mLs (500 mg of elemental calcium total) by mouth every 6 (six) hours as needed for indigestion.   carvedilol 6.25 MG tablet Commonly known as: COREG Take  6.25 mg by mouth 2 (two) times daily with a meal.   cefdinir 300 MG capsule Commonly known as: OMNICEF Take 1 capsule (300 mg total) by mouth every other day.   clonazePAM 0.5 MG disintegrating tablet Commonly known as: KLONOPIN Take 1 tablet (0.5 mg total) by mouth 2 (two) times daily.   Eliquis 5 MG Tabs tablet Generic drug: apixaban Take 1 tablet (5 mg total) by mouth 2 (two) times daily.   folic acid 1 MG tablet Commonly known as: FOLVITE Take 1 tablet (1 mg total) by mouth daily.   Lantus SoloStar 100 UNIT/ML Solostar Pen Generic drug: insulin glargine Inject 12 units into the skin at bedtime (Max 40  units) What changed: Another medication with the same name was removed. Continue taking this medication, and follow the directions you see here.   levothyroxine 75 MCG tablet Commonly known as: SYNTHROID Take 1 tablet (75 mcg total) by mouth daily before breakfast.   methocarbamol 750 MG tablet Commonly known as: ROBAXIN Take 1 tablet (750 mg total) by mouth every 6 (six) hours as needed for muscle spasms.   metoCLOPramide 5 MG tablet Commonly known as: REGLAN Take 1 tablet (5 mg total) by mouth 3 (three) times daily before meals.   metroNIDAZOLE 500 MG tablet Commonly known as: Flagyl Take 1 tablet (500 mg total) by mouth 2 (two) times daily for 2 days.   midodrine 10 MG tablet Commonly known as: PROAMATINE Take 1 tablet (10 mg total) by mouth Every Tuesday,Thursday,and Saturday with dialysis.   NovoLOG FlexPen 100 UNIT/ML FlexPen Generic drug: insulin aspart Inject 0-15 Units into the skin See admin instructions. Inject 12 units subcutaneously 3 times daily with meals (0900, 1300 & 1700) & as needed via sliding scale insulin CBG <70=Notify NP/MD 71-149=0 units,150-199=4 units, 200-249=6 units,250- 299=10 units, 300-349=12 units, 350-399=15 units, > 400 call MD ( prime pen with 2 units prior  to set dose) What changed:  how much to take when to take this additional instructions   pantoprazole 40 MG tablet Commonly known as: Protonix Take 1 tablet (40 mg total) by mouth 2 (two) times daily before a meal.   polyethylene glycol 17 g packet Commonly known as: MIRALAX / GLYCOLAX Take 17 g by mouth daily.   ramelteon 8 MG tablet Commonly known as: ROZEREM Take 1 tablet (8 mg total) by mouth at bedtime.   senna-docusate 8.6-50 MG tablet Commonly known as: Senokot-S Take 2 tablets by mouth at bedtime. What changed:  when to take this reasons to take this   sevelamer carbonate 800 MG tablet Commonly known as: RENVELA Take 2 tablets (1,600 mg total) by mouth 3 (three) times  daily with meals. (0800, 1200 & 1700)   venlafaxine XR 75 MG 24 hr capsule Commonly known as: EFFEXOR-XR Take 1 capsule (75 mg total) by mouth daily with breakfast. Stop trintellix   Zinc Sulfate 220 (50 Zn) MG Tabs Take 1 tablet (220 mg total) by mouth daily.        Follow-up Information     Donita Brooks, MD. Schedule an appointment as soon as possible for a visit in 1 week(s).   Specialty: Family Medicine Contact information: [address] [place] [address] [phone]                 Discharge Instructions     Diet - low sodium heart healthy   Complete by: As directed    Discharge instructions   Complete by:  As directed    **IMPORTANT DISCHARGE INSTRUCTIONS**   From Dr. Maryfrances Bunnell: You were admitted for nausea, vomiting, and diarrhea Here, we found that you had diabetic crisis (high blood sugar with acid building up in the blood) and also a mild proctocolitis  The diabetic crisis was treated with insulin, and you should resume your home insulin regimen and diet   The proctocolitis may have been bacterial (ie a type of food poisoning)  You got better with antibiotics, you should finish a very short course with metronidazole/Flagyl and cefdinir  Take metronidazole twice daily for 2 more days starting tonight  Take cefdinir every other day starting tonight (in other words, take cefdinir tonight Saturday night and also Monday night then stop)  Go see your primary doctor in 1 week Resume your normal home medicines   Increase activity slowly   Complete by: As directed    No wound care   Complete by: As directed        Discharge Exam: Filed Weights   01/17/23 0755 01/17/23 0800 01/17/23 1221  Weight: 83.4 kg 83.4 kg 79.3 kg    General: Pt is alert, awake, not in acute distress Cardiovascular: RRR, nl S1-S2, no murmurs appreciated.   No LE edema.   Respiratory: Normal respiratory rate and rhythm.  CTAB without rales or  wheezes. Abdominal: Abdomen soft and non-tender.  No distension or HSM.   MSK/Neuro/Psych: Right BKA.  Otherwise strength symmetric in upper and lower extremities.  Judgment and insight appear normal.   Condition at discharge: good  The results of significant diagnostics from this hospitalization (including imaging, microbiology, ancillary and laboratory) are listed below for reference.   Imaging Studies: DG CHEST PORT 1 VIEW  Result Date: 01/15/2023 CLINICAL DATA:  Hypoxia, end-stage renal disease EXAM: PORTABLE CHEST 1 VIEW COMPARISON:  11/17/2022 FINDINGS: Single frontal view of the chest demonstrates stable right internal jugular dialysis catheter. The cardiac silhouette remains enlarged. Increased pulmonary vascular congestion, with developing interstitial and ground-glass opacities greatest at the lung bases consistent with volume overload and developing edema. Trace left effusion. No pneumothorax. IMPRESSION: 1. Volume overload, with developing bibasilar pulmonary edema. Electronically Signed   By: Sharlet Salina M.D.   On: 01/15/2023 17:40   CT ABDOMEN PELVIS W CONTRAST  Result Date: 01/15/2023 CLINICAL DATA:  71 year old female with lower abdominal pain, left lower quadrant pain. Vomiting and diarrhea onset yesterday. EXAM: CT ABDOMEN AND PELVIS WITH CONTRAST TECHNIQUE: Multidetector CT imaging of the abdomen and pelvis was performed using the standard protocol following bolus administration of intravenous contrast. RADIATION DOSE REDUCTION: This exam was performed according to the departmental dose-optimization program which includes automated exposure control, adjustment of the mA and/or kV according to patient size and/or use of iterative reconstruction technique. CONTRAST:  60mL OMNIPAQUE IOHEXOL 350 MG/ML SOLN COMPARISON:  CT Abdomen and Pelvis 10/07/2022. FINDINGS: Lower chest: Layering small left pleural effusion has decreased since April but not resolved. And there is a symmetric new  layering small right pleural effusion. Cardiomegaly. No pericardial effusion. Lung base atelectasis and septal thickening. No lung base consolidation. Hepatobiliary: Hepatic steatosis with fatty sparing at the gallbladder fossa. Cholelithiasis. No gallbladder inflammation by CT. No bile duct enlargement. Pancreas: Negative. Spleen: Chronic small calcified splenic granulomas. 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 But there is a new posterosuperior spur small splenic pole infarct on series 6, image 59. No perisplenic fluid or inflammation. Chronic splenic scarring at the anterior superior pole is also stable such as  from another prior infarct. Adrenals/Urinary Tract: Stable adrenal gland thickening such as due to hyperplasia. Nonobstructed kidneys with renal vascular calcifications and some cortical atrophy. No renal mass. Little renal contrast excretion on the delayed images. Nondilated ureters. Unremarkable bladder. Stomach/Bowel: Rectum is more decompressed compared to April but there is circumferential and somewhat irregular rectal wall thickening up to 2 cm on series 3, image 72. See also coronal image 27. The distal sigmoid is decompressed, redundant but otherwise normal. Proximal sigmoid with retained stool and diverticulosis. Similar stool and diverticulosis continuing into the distal descending colon. No active inflammation in those segments. Diverticulosis at the splenic flexure and in the transverse colon with no definite inflammation. More decompressed right colon today. Fecalith versus contrast containing appendix remains diminutive as seen on coronal images 51 through 58 and does not appear inflamed although there is regional mesenteric edema which appears dependent, generalized. No dilated small bowel. Stomach and duodenum appear negative. No free air. No free fluid. Vascular/Lymphatic: Diffuse severe calcified atherosclerosis. Aortoiliac calcified atherosclerosis. Normal  caliber abdominal aorta. Major arterial structures remain patent. Grossly patent main portal vein. No lymphadenopathy identified. Reproductive: Absent uterus.  Diminutive or absent ovaries. Other: No free fluid in the pelvis. Musculoskeletal: Lower lumbar advanced disc and endplate degeneration with vacuum disc. No acute osseous abnormality identified. Body wall edema, regressed along the left lateral body wall since April and similar elsewhere. IMPRESSION: 1. Anasarca with small layering pleural effusions and evidence of lung base pulmonary interstitial edema. Body wall edema and left pleural effusion both regressed compared to April. 2. 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 No perisplenic fluid or inflammation. No thrombus identified within the visible aorta but there is severe diffuse calcified atherosclerosis. Aortic Atherosclerosis (ICD10-I70.0). 3. Nonspecific circumferential rectal wall thickening. This most resembles Proctitis. Artifact due to under-distension or bowel edema due to #1 felt less likely. 4. Some generalized mesenteric edema suspected and related to #1. Some of this in the region of the appendix, but the appendix appears to remain normal. 5. Cholelithiasis without CT evidence of acute cholecystitis. Hepatic steatosis. 6. Evidence of renal insufficiency. Electronically Signed   By: Odessa Fleming M.D.   On: 01/15/2023 07:50    Microbiology: Results for orders placed or performed during the hospital encounter of 11/10/22  Blood Cultures x 2 sites     Status: None   Collection Time: 11/10/22 12:58 PM   Specimen: BLOOD RIGHT ARM  Result Value Ref Range Status   Specimen Description BLOOD RIGHT ARM  Final   Special Requests   Final    BOTTLES DRAWN AEROBIC AND ANAEROBIC Blood Culture results may not be optimal due to an inadequate volume of blood received in culture bottles   Culture   Final    NO GROWTH 5 DAYS Performed at [facility], [address], [place],  [place] [address]    Report Status 11/15/2022 FINAL  Final  Blood Cultures x 2 sites     Status: None   Collection Time: 11/10/22  4:41 PM   Specimen: BLOOD RIGHT FOREARM  Result Value Ref Range Status   Specimen Description BLOOD RIGHT FOREARM  Final   Special Requests   Final    BOTTLES DRAWN AEROBIC AND ANAEROBIC Blood Culture adequate volume   Culture   Final    NO GROWTH 5 DAYS Performed at [facility], [address], [place], [place] [address]    Report Status 11/15/2022 FINAL  Final  Surgical  pcr screen     Status: Abnormal   Collection Time: 11/11/22 11:31 PM   Specimen: Nasal Mucosa; Nasal Swab  Result Value Ref Range Status   MRSA, PCR NEGATIVE NEGATIVE Final   Staphylococcus aureus POSITIVE (A) NEGATIVE Final    Comment: (NOTE) The Xpert SA Assay (FDA approved for NASAL specimens in patients 40 years of age and older), is one component of a comprehensive surveillance program. It is not intended to diagnose infection nor to guide or monitor treatment. Performed at [facility], [address], [place], [place] [address]   Aerobic/Anaerobic Culture w Gram Stain (surgical/deep wound)     Status: None   Collection Time: 11/14/22  9:19 AM   Specimen: Soft Tissue, Other  Result Value Ref Range Status   Specimen Description TISSUE  Final   Special Requests NONE  Final   Gram Stain   Final    MODERATE WBC PRESENT,BOTH PMN AND MONONUCLEAR RARE SQUAMOUS EPITHELIAL CELLS PRESENT NO ORGANISMS SEEN Performed at [facility], [address] [place], [place] [address]    Culture   Final    FEW PROTEUS MIRABILIS RARE KLEBSIELLA PNEUMONIAE NO ANAEROBES ISOLATED FEW ENTEROCOCCUS FAECALIS    Report Status 11/19/2022 FINAL  Final   Organism ID, Bacteria PROTEUS MIRABILIS  Final   Organism ID, Bacteria KLEBSIELLA PNEUMONIAE  Final   Organism ID, Bacteria ENTEROCOCCUS FAECALIS  Final      Susceptibility   Enterococcus faecalis - MIC*    AMPICILLIN <=2 SENSITIVE  Sensitive     VANCOMYCIN 1 SENSITIVE Sensitive     GENTAMICIN SYNERGY SENSITIVE Sensitive     * FEW ENTEROCOCCUS FAECALIS   Klebsiella pneumoniae - MIC*    AMPICILLIN >=32 RESISTANT Resistant     CEFEPIME <=0.12 SENSITIVE Sensitive     CEFTAZIDIME <=1 SENSITIVE Sensitive     CEFTRIAXONE <=0.25 SENSITIVE Sensitive     CIPROFLOXACIN <=0.25 SENSITIVE Sensitive     GENTAMICIN <=1 SENSITIVE Sensitive     IMIPENEM <=0.25 SENSITIVE Sensitive     TRIMETH/SULFA <=20 SENSITIVE Sensitive     AMPICILLIN/SULBACTAM 8 SENSITIVE Sensitive     PIP/TAZO 8 SENSITIVE Sensitive     * RARE KLEBSIELLA PNEUMONIAE   Proteus mirabilis - MIC*    AMPICILLIN <=2 SENSITIVE Sensitive     CEFEPIME <=0.12 SENSITIVE Sensitive     CEFTAZIDIME <=1 SENSITIVE Sensitive     CEFTRIAXONE <=0.25 SENSITIVE Sensitive     CIPROFLOXACIN <=0.25 SENSITIVE Sensitive     GENTAMICIN <=1 SENSITIVE Sensitive     IMIPENEM 2 SENSITIVE Sensitive     TRIMETH/SULFA <=20 SENSITIVE Sensitive     AMPICILLIN/SULBACTAM <=2 SENSITIVE Sensitive     PIP/TAZO <=4 SENSITIVE Sensitive     * FEW PROTEUS MIRABILIS   *Note: Due to a large number of results and/or encounters for the requested time period, some results have not been displayed. A complete set of results can be found in Results Review.    Labs: CBC: Recent Labs  Lab 01/15/23 0513 01/16/23 0404 01/17/23 0427  WBC 8.6 10.4 7.0  HGB 10.6* 10.1* 9.2*  HCT 35.9* 33.1* 29.2*  MCV 75.6* 75.6* 74.1*  PLT 277 252 221   Basic Metabolic Panel: Recent Labs  Lab 01/15/23 0513 01/15/23 0813 01/15/23 1514 01/16/23 0404 01/17/23 0427  NA 124* 124* 127* 133* 127*  K 4.0 4.0 3.7 3.4* 3.9  CL 85* 86* 90* 95* 91*  CO2 20* 23 19* 28 21*  GLUCOSE 687* 692* 407* 170*  177*  BUN 52* 54* 57* 28* 52*  CREATININE 4.27* 4.34* 4.30* 2.70* 4.08*  CALCIUM 9.0 8.7* 8.9 8.7* 8.3*  PHOS  --   --  5.2* 3.4 4.8*   Liver Function Tests: Recent Labs  Lab 01/15/23 0513 01/16/23 0404 01/17/23 0427   AST 19  --   --   ALT 23  --   --   ALKPHOS 300*  --   --   BILITOT 0.8  --   --   PROT 7.4  --   --   ALBUMIN 3.0* 2.4* 2.4*   CBG: Recent Labs  Lab 01/16/23 1223 01/16/23 1707 01/16/23 2128 01/17/23 0623 01/17/23 1300  GLUCAP 303* 398* 328* 214* 139*    Discharge time spent: approximately 35 minutes spent on discharge counseling, evaluation of patient on day of discharge, and coordination of discharge planning with nursing, social work, pharmacy and case management  Signed: Alberteen Sam, MD [facility] 01/17/2023

## 2023-01-17 NOTE — Progress Notes (Signed)
Susan [name] to be D/C'd home per MD order. Discussed with the patient and all questions fully answered.  Skin clean and dry.  IV catheter discontinued intact. Site without signs and symptoms of complications. Dressing and pressure applied. Right subclavian HD catheter clean and intact.  An After Visit Summary was printed and given to the patient.  Patient escorted via WC, and D/C home via private auto.  [name]  01/17/2023

## 2023-01-18 NOTE — Progress Notes (Signed)
[facility] Patient Discharge Orders- [facility]: [facility].  Patient's name: CORRINA DICKERMAN Admit/DC Dates: 01/15/2023 - 01/17/2023  Discharge Diagnoses: Uncontrolled type 2 diabetes mellitus with hyperglycemia, with long-term current use of insulin   Proctitis  Anasarca ( on ct scan abd )    Aranesp: Given: no   Date and amount of last dose: 0  Last Hgb: 9.2 PRBC's Given: 0 Date/# of units: 0 ESA dose for discharge: mircera 120 mcg IV q 4 weeks  give next hd  IV Iron dose at discharge: 0  Heparin change: 0  EDW Change: lower New EDW: 79   ( Anasarca) on ct  and exam  lower edw at center as able   Bath Change: no  Access intervention/Change: no Details:  Hectorol/Calcitriol change: 00  Discharge Labs: Calcium8.3 Phosphorus 3.4 Albumin 2.4 K+ 3.9  IV Antibiotics: no Details:  On Coumadin?: no Last INR: Next INR: Managed By:   OTHER/APPTS/LAB ORDERS:    D/C Meds to be reconciled by nurse after every discharge.  Completed By:   Reviewed by: MD:______ RN_______

## 2023-01-19 ENCOUNTER — Telehealth: Payer: Self-pay | Admitting: Family Medicine

## 2023-01-19 ENCOUNTER — Other Ambulatory Visit: Payer: Self-pay | Admitting: Family Medicine

## 2023-01-19 ENCOUNTER — Telehealth (HOSPITAL_COMMUNITY): Payer: Self-pay | Admitting: Nephrology

## 2023-01-19 ENCOUNTER — Encounter: Payer: PPO | Admitting: Physical Medicine & Rehabilitation

## 2023-01-19 ENCOUNTER — Telehealth: Payer: Self-pay | Admitting: Orthopedic Surgery

## 2023-01-19 DIAGNOSIS — F339 Major depressive disorder, recurrent, unspecified: Secondary | ICD-10-CM

## 2023-01-19 DIAGNOSIS — K5120 Ulcerative (chronic) proctitis without complications: Secondary | ICD-10-CM | POA: Insufficient documentation

## 2023-01-19 MED ORDER — CLONAZEPAM 0.5 MG PO TBDP
0.5000 mg | ORAL_TABLET | Freq: Two times a day (BID) | ORAL | 1 refills | Status: DC
Start: 2023-01-19 — End: 2023-06-30

## 2023-01-19 MED ORDER — RAMELTEON 8 MG PO TABS: 8.0000 mg | ORAL_TABLET | Freq: Every day | ORAL | 0 refills | Status: DC

## 2023-01-19 NOTE — Progress Notes (Signed)
Late Note Entry- January 19, 2023  Pt was D/C on Saturday. Contacted [facility] this morning to advise clinic of pt's d/c date and that pt should resume care tomorrow.   Olivia Canter Renal Navigator [phone]

## 2023-01-19 NOTE — Telephone Encounter (Signed)
Patient called advised she can not come to her appointment scheduled for 01/27/2023 due to having Dialysis. Patient said she can come in on Monday, Wednesday.   Patient said she do not wish to see Erin. The number to contact patient is [phone]

## 2023-01-19 NOTE — Telephone Encounter (Signed)
Transition of Care - Initial Contact from Inpatient Facility  Date of discharge: 01/17/23 Date of contact: 01/19/23  Method: Phone Spoke to: Patient  Patient contacted to discuss transition of care from recent inpatient hospitalization. Patient was admitted to [name] from 8/22 - 01/17/23 with discharge diagnosis of proctitis and HHK (uncontrolled DM).  The discharge medication list was reviewed. Patient understands the changes and has no concerns. Finishing last 2 days of abx.  Patient will return to his/her outpatient HD unit on: TTS schedule -> tomorrow.  No other concerns at this time.  Ozzie Hoyle, PA-C [employer] Pager [phone]

## 2023-01-19 NOTE — Telephone Encounter (Signed)
Prescription Request  01/19/2023  LOV: 12/26/2022  What is the name of the medication or equipment?   clonazePAM (KLONOPIN) 0.5 MG disintegrating tablet   ramelteon (ROZEREM) 8 MG tablet [[id number]]  **Patient out of meds. Has HFU on Friday, 01/23/23**  Have you contacted your pharmacy to request a refill? Yes   Which pharmacy would you like this sent to?    [id number] [place], [place] - [address] [place] [address] Phone: [phone] Fax: [phone]   Patient notified that their request is being sent to the clinical staff for review and that they should receive a response within 2 business days.   Please advise patient at [phone].

## 2023-01-20 ENCOUNTER — Emergency Department (HOSPITAL_COMMUNITY): Payer: PPO

## 2023-01-20 ENCOUNTER — Other Ambulatory Visit: Payer: Self-pay

## 2023-01-20 ENCOUNTER — Encounter (HOSPITAL_COMMUNITY): Payer: Self-pay | Admitting: Emergency Medicine

## 2023-01-20 ENCOUNTER — Inpatient Hospital Stay (HOSPITAL_COMMUNITY)
Admission: EM | Admit: 2023-01-20 | Discharge: 2023-02-02 | DRG: 391 | Disposition: A | Discharge status: Home Health | Payer: PPO | Attending: Family Medicine | Admitting: Family Medicine

## 2023-01-20 DIAGNOSIS — I739 Peripheral vascular disease, unspecified: Secondary | ICD-10-CM | POA: Diagnosis not present

## 2023-01-20 DIAGNOSIS — Z8673 Personal history of transient ischemic attack (TIA), and cerebral infarction without residual deficits: Secondary | ICD-10-CM

## 2023-01-20 DIAGNOSIS — Z992 Dependence on renal dialysis: Secondary | ICD-10-CM | POA: Diagnosis not present

## 2023-01-20 DIAGNOSIS — D631 Anemia in chronic kidney disease: Secondary | ICD-10-CM | POA: Diagnosis not present

## 2023-01-20 DIAGNOSIS — R0602 Shortness of breath: Secondary | ICD-10-CM | POA: Diagnosis not present

## 2023-01-20 DIAGNOSIS — I4510 Unspecified right bundle-branch block: Secondary | ICD-10-CM | POA: Diagnosis present

## 2023-01-20 DIAGNOSIS — E1143 Type 2 diabetes mellitus with diabetic autonomic (poly)neuropathy: Secondary | ICD-10-CM | POA: Diagnosis present

## 2023-01-20 DIAGNOSIS — G4700 Insomnia, unspecified: Secondary | ICD-10-CM | POA: Diagnosis present

## 2023-01-20 DIAGNOSIS — F410 Panic disorder [episodic paroxysmal anxiety] without agoraphobia: Secondary | ICD-10-CM | POA: Diagnosis present

## 2023-01-20 DIAGNOSIS — Z90710 Acquired absence of both cervix and uterus: Secondary | ICD-10-CM

## 2023-01-20 DIAGNOSIS — I5022 Chronic systolic (congestive) heart failure: Secondary | ICD-10-CM | POA: Diagnosis present

## 2023-01-20 DIAGNOSIS — E11319 Type 2 diabetes mellitus with unspecified diabetic retinopathy without macular edema: Secondary | ICD-10-CM | POA: Diagnosis not present

## 2023-01-20 DIAGNOSIS — R519 Headache, unspecified: Secondary | ICD-10-CM | POA: Diagnosis present

## 2023-01-20 DIAGNOSIS — I120 Hypertensive chronic kidney disease with stage 5 chronic kidney disease or end stage renal disease: Secondary | ICD-10-CM | POA: Diagnosis not present

## 2023-01-20 DIAGNOSIS — E8889 Other specified metabolic disorders: Secondary | ICD-10-CM | POA: Diagnosis present

## 2023-01-20 DIAGNOSIS — R600 Localized edema: Secondary | ICD-10-CM

## 2023-01-20 DIAGNOSIS — R54 Age-related physical debility: Secondary | ICD-10-CM | POA: Diagnosis present

## 2023-01-20 DIAGNOSIS — F331 Major depressive disorder, recurrent, moderate: Secondary | ICD-10-CM | POA: Diagnosis not present

## 2023-01-20 DIAGNOSIS — R1011 Right upper quadrant pain: Secondary | ICD-10-CM | POA: Diagnosis not present

## 2023-01-20 DIAGNOSIS — J9611 Chronic respiratory failure with hypoxia: Secondary | ICD-10-CM | POA: Diagnosis not present

## 2023-01-20 DIAGNOSIS — Z794 Long term (current) use of insulin: Secondary | ICD-10-CM

## 2023-01-20 DIAGNOSIS — E860 Dehydration: Secondary | ICD-10-CM | POA: Diagnosis not present

## 2023-01-20 DIAGNOSIS — R112 Nausea with vomiting, unspecified: Secondary | ICD-10-CM | POA: Diagnosis present

## 2023-01-20 DIAGNOSIS — R110 Nausea: Secondary | ICD-10-CM | POA: Diagnosis not present

## 2023-01-20 DIAGNOSIS — Z885 Allergy status to narcotic agent status: Secondary | ICD-10-CM

## 2023-01-20 DIAGNOSIS — E039 Hypothyroidism, unspecified: Secondary | ICD-10-CM | POA: Diagnosis not present

## 2023-01-20 DIAGNOSIS — E1142 Type 2 diabetes mellitus with diabetic polyneuropathy: Secondary | ICD-10-CM | POA: Diagnosis not present

## 2023-01-20 DIAGNOSIS — I132 Hypertensive heart and chronic kidney disease with heart failure and with stage 5 chronic kidney disease, or end stage renal disease: Secondary | ICD-10-CM | POA: Diagnosis not present

## 2023-01-20 DIAGNOSIS — D735 Infarction of spleen: Secondary | ICD-10-CM | POA: Diagnosis not present

## 2023-01-20 DIAGNOSIS — D638 Anemia in other chronic diseases classified elsewhere: Secondary | ICD-10-CM | POA: Diagnosis not present

## 2023-01-20 DIAGNOSIS — Z79890 Hormone replacement therapy (postmenopausal): Secondary | ICD-10-CM

## 2023-01-20 DIAGNOSIS — E871 Hypo-osmolality and hyponatremia: Secondary | ICD-10-CM | POA: Diagnosis present

## 2023-01-20 DIAGNOSIS — I480 Paroxysmal atrial fibrillation: Secondary | ICD-10-CM | POA: Diagnosis present

## 2023-01-20 DIAGNOSIS — E1151 Type 2 diabetes mellitus with diabetic peripheral angiopathy without gangrene: Secondary | ICD-10-CM | POA: Diagnosis present

## 2023-01-20 DIAGNOSIS — F32A Depression, unspecified: Secondary | ICD-10-CM | POA: Diagnosis not present

## 2023-01-20 DIAGNOSIS — B3781 Candidal esophagitis: Secondary | ICD-10-CM | POA: Diagnosis present

## 2023-01-20 DIAGNOSIS — M6281 Muscle weakness (generalized): Secondary | ICD-10-CM | POA: Diagnosis not present

## 2023-01-20 DIAGNOSIS — F411 Generalized anxiety disorder: Secondary | ICD-10-CM | POA: Diagnosis present

## 2023-01-20 DIAGNOSIS — Z87442 Personal history of urinary calculi: Secondary | ICD-10-CM

## 2023-01-20 DIAGNOSIS — I252 Old myocardial infarction: Secondary | ICD-10-CM

## 2023-01-20 DIAGNOSIS — R0989 Other specified symptoms and signs involving the circulatory and respiratory systems: Secondary | ICD-10-CM | POA: Diagnosis not present

## 2023-01-20 DIAGNOSIS — I2510 Atherosclerotic heart disease of native coronary artery without angina pectoris: Secondary | ICD-10-CM | POA: Diagnosis present

## 2023-01-20 DIAGNOSIS — E1122 Type 2 diabetes mellitus with diabetic chronic kidney disease: Secondary | ICD-10-CM | POA: Diagnosis present

## 2023-01-20 DIAGNOSIS — K219 Gastro-esophageal reflux disease without esophagitis: Secondary | ICD-10-CM | POA: Diagnosis present

## 2023-01-20 DIAGNOSIS — N250 Renal osteodystrophy: Secondary | ICD-10-CM | POA: Diagnosis not present

## 2023-01-20 DIAGNOSIS — K5730 Diverticulosis of large intestine without perforation or abscess without bleeding: Secondary | ICD-10-CM | POA: Diagnosis present

## 2023-01-20 DIAGNOSIS — M159 Polyosteoarthritis, unspecified: Secondary | ICD-10-CM | POA: Diagnosis present

## 2023-01-20 DIAGNOSIS — Z9842 Cataract extraction status, left eye: Secondary | ICD-10-CM

## 2023-01-20 DIAGNOSIS — N184 Chronic kidney disease, stage 4 (severe): Secondary | ICD-10-CM

## 2023-01-20 DIAGNOSIS — R1031 Right lower quadrant pain: Secondary | ICD-10-CM | POA: Diagnosis not present

## 2023-01-20 DIAGNOSIS — Z7901 Long term (current) use of anticoagulants: Secondary | ICD-10-CM

## 2023-01-20 DIAGNOSIS — Z87891 Personal history of nicotine dependence: Secondary | ICD-10-CM

## 2023-01-20 DIAGNOSIS — R739 Hyperglycemia, unspecified: Secondary | ICD-10-CM

## 2023-01-20 DIAGNOSIS — Z89511 Acquired absence of right leg below knee: Secondary | ICD-10-CM | POA: Diagnosis not present

## 2023-01-20 DIAGNOSIS — E785 Hyperlipidemia, unspecified: Secondary | ICD-10-CM | POA: Diagnosis present

## 2023-01-20 DIAGNOSIS — K3184 Gastroparesis: Secondary | ICD-10-CM | POA: Diagnosis present

## 2023-01-20 DIAGNOSIS — Z8249 Family history of ischemic heart disease and other diseases of the circulatory system: Secondary | ICD-10-CM

## 2023-01-20 DIAGNOSIS — Z9181 History of falling: Secondary | ICD-10-CM

## 2023-01-20 DIAGNOSIS — K529 Noninfective gastroenteritis and colitis, unspecified: Secondary | ICD-10-CM | POA: Diagnosis not present

## 2023-01-20 DIAGNOSIS — I959 Hypotension, unspecified: Secondary | ICD-10-CM | POA: Diagnosis not present

## 2023-01-20 DIAGNOSIS — Z79899 Other long term (current) drug therapy: Secondary | ICD-10-CM

## 2023-01-20 DIAGNOSIS — G4709 Other insomnia: Secondary | ICD-10-CM | POA: Diagnosis present

## 2023-01-20 DIAGNOSIS — N186 End stage renal disease: Secondary | ICD-10-CM | POA: Diagnosis present

## 2023-01-20 DIAGNOSIS — J90 Pleural effusion, not elsewhere classified: Secondary | ICD-10-CM | POA: Diagnosis not present

## 2023-01-20 DIAGNOSIS — I5043 Acute on chronic combined systolic (congestive) and diastolic (congestive) heart failure: Secondary | ICD-10-CM | POA: Diagnosis not present

## 2023-01-20 DIAGNOSIS — Z993 Dependence on wheelchair: Secondary | ICD-10-CM

## 2023-01-20 DIAGNOSIS — R066 Hiccough: Secondary | ICD-10-CM | POA: Diagnosis present

## 2023-01-20 DIAGNOSIS — K626 Ulcer of anus and rectum: Secondary | ICD-10-CM | POA: Diagnosis not present

## 2023-01-20 DIAGNOSIS — K8020 Calculus of gallbladder without cholecystitis without obstruction: Secondary | ICD-10-CM | POA: Diagnosis present

## 2023-01-20 DIAGNOSIS — I5042 Chronic combined systolic (congestive) and diastolic (congestive) heart failure: Secondary | ICD-10-CM | POA: Diagnosis not present

## 2023-01-20 DIAGNOSIS — Z881 Allergy status to other antibiotic agents status: Secondary | ICD-10-CM

## 2023-01-20 DIAGNOSIS — Z9841 Cataract extraction status, right eye: Secondary | ICD-10-CM

## 2023-01-20 DIAGNOSIS — Z955 Presence of coronary angioplasty implant and graft: Secondary | ICD-10-CM

## 2023-01-20 LAB — COMPREHENSIVE METABOLIC PANEL
ALT: 18 U/L (ref 0–44)
AST: 15 U/L (ref 15–41)
Albumin: 3.2 g/dL — ABNORMAL LOW (ref 3.5–5.0)
Alkaline Phosphatase: 243 U/L — ABNORMAL HIGH (ref 38–126)
Anion gap: 19 — ABNORMAL HIGH (ref 5–15)
BUN: 60 mg/dL — ABNORMAL HIGH (ref 8–23)
CO2: 21 mmol/L — ABNORMAL LOW (ref 22–32)
Calcium: 9.8 mg/dL (ref 8.9–10.3)
Chloride: 92 mmol/L — ABNORMAL LOW (ref 98–111)
Creatinine, Ser: 5.12 mg/dL — ABNORMAL HIGH (ref 0.44–1.00)
GFR, Estimated: 8 mL/min — ABNORMAL LOW (ref >60)
Glucose, Bld: 309 mg/dL — ABNORMAL HIGH (ref 70–99)
Potassium: 4.1 mmol/L (ref 3.5–5.1)
Sodium: 132 mmol/L — ABNORMAL LOW (ref 135–145)
Total Bilirubin: 0.5 mg/dL (ref 0.3–1.2)
Total Protein: 7.4 g/dL (ref 6.5–8.1)

## 2023-01-20 LAB — GLUCOSE, CAPILLARY
Glucose-Capillary: 272 mg/dL — ABNORMAL HIGH (ref 70–99)
Glucose-Capillary: 107 mg/dL — ABNORMAL HIGH (ref 70–99)
Glucose-Capillary: 96 mg/dL (ref 70–99)

## 2023-01-20 LAB — CBC
HCT: 37.4 % (ref 36.0–46.0)
Hemoglobin: 11.2 g/dL — ABNORMAL LOW (ref 12.0–15.0)
MCH: 22.2 pg — ABNORMAL LOW (ref 26.0–34.0)
MCHC: 29.9 g/dL — ABNORMAL LOW (ref 30.0–36.0)
MCV: 74.2 fL — ABNORMAL LOW (ref 80.0–100.0)
Platelets: 338 10*3/uL (ref 150–400)
RBC: 5.04 MIL/uL (ref 3.87–5.11)
RDW: 17.8 % — ABNORMAL HIGH (ref 11.5–15.5)
WBC: 10.5 10*3/uL (ref 4.0–10.5)
nRBC: 0 % (ref 0.0–0.2)

## 2023-01-20 LAB — MRSA NEXT GEN BY PCR, NASAL: MRSA by PCR Next Gen: NOT DETECTED

## 2023-01-20 LAB — CBG MONITORING, ED: Glucose-Capillary: 280 mg/dL — ABNORMAL HIGH (ref 70–99)

## 2023-01-20 LAB — HEPATITIS B SURFACE ANTIGEN: Hepatitis B Surface Ag: NONREACTIVE

## 2023-01-20 LAB — LIPASE, BLOOD: Lipase: 31 U/L (ref 11–51)

## 2023-01-20 MED ORDER — HEPARIN SODIUM (PORCINE) 1000 UNIT/ML IJ SOLN
INTRAMUSCULAR | Status: AC
  Administered 2023-01-20: 3800 [IU]
  Filled 2023-01-20: qty 4

## 2023-01-20 MED ORDER — FOLIC ACID 1 MG PO TABS
1.0000 mg | ORAL_TABLET | Freq: Every day | ORAL | Status: DC
  Administered 2023-01-21 – 2023-02-02 (×12): 1 mg via ORAL
  Filled 2023-01-20 (×15): qty 1

## 2023-01-20 MED ORDER — MIDODRINE HCL 5 MG PO TABS: 10.0000 mg | ORAL_TABLET | Freq: Once | ORAL | Status: DC

## 2023-01-20 MED ORDER — HEPARIN SODIUM (PORCINE) 1000 UNIT/ML DIALYSIS: 1000.0000 [IU] | INTRAMUSCULAR | Status: DC | PRN

## 2023-01-20 MED ORDER — IOHEXOL 350 MG/ML SOLN
75.0000 mL | Freq: Once | INTRAVENOUS | Status: AC | PRN
  Administered 2023-01-20: 75 mL via INTRAVENOUS

## 2023-01-20 MED ORDER — ONDANSETRON HCL 4 MG/2ML IJ SOLN
4.0000 mg | Freq: Once | INTRAMUSCULAR | Status: AC
  Administered 2023-01-20: 4 mg via INTRAVENOUS
  Filled 2023-01-20: qty 2

## 2023-01-20 MED ORDER — ACETAMINOPHEN 325 MG PO TABS
650.0000 mg | ORAL_TABLET | Freq: Four times a day (QID) | ORAL | Status: DC | PRN
  Administered 2023-01-31: 650 mg via ORAL
  Filled 2023-01-20: qty 2

## 2023-01-20 MED ORDER — CHLORHEXIDINE GLUCONATE CLOTH 2 % EX PADS
6.0000 | MEDICATED_PAD | Freq: Every day | CUTANEOUS | Status: DC
  Administered 2023-01-21 – 2023-01-24 (×4): 6 via TOPICAL

## 2023-01-20 MED ORDER — LIDOCAINE HCL (PF) 1 % IJ SOLN: 5.0000 mL | INTRAMUSCULAR | Status: DC | PRN

## 2023-01-20 MED ORDER — SODIUM CHLORIDE 0.9 % IV SOLN
12.5000 mg | Freq: Once | INTRAVENOUS | Status: AC
  Administered 2023-01-20: 12.5 mg via INTRAVENOUS
  Filled 2023-01-20: qty 0.5

## 2023-01-20 MED ORDER — LIDOCAINE-PRILOCAINE 2.5-2.5 % EX CREA: 1.0000 | TOPICAL_CREAM | CUTANEOUS | Status: DC | PRN

## 2023-01-20 MED ORDER — PENTAFLUOROPROP-TETRAFLUOROETH EX AERO: 1.0000 | INHALATION_SPRAY | CUTANEOUS | Status: DC | PRN

## 2023-01-20 MED ORDER — ANTICOAGULANT SODIUM CITRATE 4% (200MG/5ML) IV SOLN: 5.0000 mL | Status: DC | PRN

## 2023-01-20 MED ORDER — ACETAMINOPHEN 650 MG RE SUPP
650.0000 mg | Freq: Four times a day (QID) | RECTAL | Status: DC | PRN
  Administered 2023-01-30: 650 mg via RECTAL
  Filled 2023-01-20: qty 1

## 2023-01-20 MED ORDER — ATORVASTATIN CALCIUM 80 MG PO TABS
80.0000 mg | ORAL_TABLET | Freq: Every day | ORAL | Status: DC
  Administered 2023-01-21 – 2023-02-02 (×11): 80 mg via ORAL
  Filled 2023-01-20 (×13): qty 1

## 2023-01-20 MED ORDER — LORAZEPAM 2 MG/ML IJ SOLN
0.5000 mg | Freq: Four times a day (QID) | INTRAMUSCULAR | Status: DC | PRN
  Administered 2023-01-21 – 2023-01-24 (×8): 0.5 mg via INTRAVENOUS
  Filled 2023-01-20 (×9): qty 1

## 2023-01-20 MED ORDER — PANTOPRAZOLE SODIUM 40 MG IV SOLR
40.0000 mg | INTRAVENOUS | Status: DC
  Administered 2023-01-20 – 2023-01-24 (×5): 40 mg via INTRAVENOUS
  Filled 2023-01-20 (×5): qty 10

## 2023-01-20 MED ORDER — INSULIN GLARGINE-YFGN 100 UNIT/ML ~~LOC~~ SOLN
12.0000 [IU] | Freq: Every day | SUBCUTANEOUS | Status: DC
  Filled 2023-01-20 (×2): qty 0.12

## 2023-01-20 MED ORDER — INSULIN ASPART 100 UNIT/ML IJ SOLN
0.0000 [IU] | Freq: Three times a day (TID) | INTRAMUSCULAR | Status: DC
  Administered 2023-01-20: 5 [IU] via SUBCUTANEOUS
  Administered 2023-01-21: 3 [IU] via SUBCUTANEOUS
  Administered 2023-01-21: 2 [IU] via SUBCUTANEOUS
  Administered 2023-01-22 – 2023-01-23 (×3): 3 [IU] via SUBCUTANEOUS
  Administered 2023-01-23 (×2): 2 [IU] via SUBCUTANEOUS
  Administered 2023-01-23: 3 [IU] via SUBCUTANEOUS

## 2023-01-20 MED ORDER — SUCRALFATE 1 GM/10ML PO SUSP
1.0000 g | Freq: Three times a day (TID) | ORAL | Status: DC
  Administered 2023-01-20 – 2023-02-02 (×37): 1 g via ORAL
  Filled 2023-01-20 (×43): qty 10

## 2023-01-20 MED ORDER — ALBUTEROL SULFATE (2.5 MG/3ML) 0.083% IN NEBU: 2.5000 mg | INHALATION_SOLUTION | RESPIRATORY_TRACT | Status: DC | PRN

## 2023-01-20 MED ORDER — LEVOTHYROXINE SODIUM 75 MCG PO TABS
75.0000 ug | ORAL_TABLET | Freq: Every day | ORAL | Status: DC
  Administered 2023-01-21 – 2023-02-02 (×13): 75 ug via ORAL
  Filled 2023-01-20 (×13): qty 1

## 2023-01-20 MED ORDER — APIXABAN 5 MG PO TABS
5.0000 mg | ORAL_TABLET | Freq: Two times a day (BID) | ORAL | Status: DC
  Administered 2023-01-21 – 2023-01-24 (×8): 5 mg via ORAL
  Filled 2023-01-20 (×10): qty 1

## 2023-01-20 MED ORDER — CLONAZEPAM 0.5 MG PO TBDP
0.5000 mg | ORAL_TABLET | Freq: Two times a day (BID) | ORAL | Status: DC
  Administered 2023-01-20 – 2023-02-02 (×24): 0.5 mg via ORAL
  Filled 2023-01-20 (×26): qty 1

## 2023-01-20 MED ORDER — SEVELAMER CARBONATE 800 MG PO TABS
1600.0000 mg | ORAL_TABLET | Freq: Three times a day (TID) | ORAL | Status: DC
  Administered 2023-01-21 – 2023-01-24 (×4): 1600 mg via ORAL
  Filled 2023-01-20 (×10): qty 2

## 2023-01-20 MED ORDER — CALCITRIOL 0.5 MCG PO CAPS
0.7500 ug | ORAL_CAPSULE | Freq: Every day | ORAL | Status: DC
  Administered 2023-01-20 – 2023-02-02 (×13): 0.75 ug via ORAL
  Filled 2023-01-20 (×14): qty 1

## 2023-01-20 MED ORDER — LORAZEPAM 2 MG/ML IJ SOLN
0.5000 mg | Freq: Once | INTRAMUSCULAR | Status: AC
  Administered 2023-01-20: 0.5 mg via INTRAVENOUS
  Filled 2023-01-20: qty 1

## 2023-01-20 MED ORDER — ALTEPLASE 2 MG IJ SOLR: 2.0000 mg | Freq: Once | INTRAMUSCULAR | Status: DC | PRN

## 2023-01-20 MED ORDER — CARVEDILOL 6.25 MG PO TABS
6.2500 mg | ORAL_TABLET | Freq: Two times a day (BID) | ORAL | Status: DC
  Administered 2023-01-21 – 2023-02-02 (×17): 6.25 mg via ORAL
  Filled 2023-01-20 (×23): qty 1

## 2023-01-20 NOTE — Procedures (Signed)
Seen and examined on dialysis.  Procedure supervised.  Blood pressure 160/79 and HR 85.  Tolerating goal.  RIJ tunn catheter   She is hypertensive here.  States that she takes midodrine before HD and then brings it to HD in case needs again.  Per HD RN her pressures do drop during tx.  Will order midodrine once for use if needed per her usual regimen   Estanislado Emms, MD 01/20/2023 3:19 PM

## 2023-01-20 NOTE — Progress Notes (Signed)
Patient informed that not to unwrap left lower drsg while trying to do skin check with 2 nurses.  She told that Dr. Lajoyce Corners told her that the drsg should not be removed.

## 2023-01-20 NOTE — H&P (Signed)
History and Physical    Susan [name] WUJ:[id number] DOB: 09-Feb-1951 DOA: 01/20/2023  PCP: Donita Brooks, MD  Patient coming from: home  I have personally briefly reviewed patient's old medical records in [facility]  Chief Complaint: n/v  HPI: Susan [name] is a 72 y.o. female with medical history significant of  sCHF EF 30-35%, ESRD on HD TThS, PVD s/p BKA, wheelchair limited mostly, hypothyroidism, morbid obesity, CAD DM and Afib on Eliquis who has interim history of admission  8/22-8/24  at which time she presented with  N/V/D and was found to have proctocolitis and well as early DKA. Patient was started on insulin drip and iv antibiotics. Patient stabilized and was discharged on cephalexin to complete course. Patient now returns to ED with recurrent n/v and inability to take po.  Patient notes no chest pain no fever/ no chills , does have lower abdominal pain.   ED Course:  Vitals ,afeb, bp 164/80hr 83, sat 100% on 3L  rr 26  Labs: Wbc 10.5, hgb 11.2, platelet 338,  Lipase 31  Na 132, K 4.1, cl 92, bicarb 21, glu 309, cr 5.12, alkphos 243 AG 19  U/S IMPRESSION: Cholelithiasis. No secondary signs of acute cholecystitis.   CT IMPRESSION: 1. Cardiomegaly with pulmonary vascular congestion. 2. Interstitial prominence bilaterally, likely edema, not significantly changed from the prior exam. 3. Small bilateral pleural effusions.  EKG: Nsr  , RBBB Tx ativan 0.5 mg  iv, zofran 4mg , promethazine 12.5 Review of Systems: As per HPI otherwise 10 point review of systems negative.   Past Medical History:  Diagnosis Date   Anemia    hx   Anxiety    Arthritis    "generalized" (03/15/2014)   CAD (coronary artery disease)    MI in 2000 - MI  2007 - treated bare metal stent (no nuclear since then as 9/11)   Carotid artery disease (HCC)    Chronic diastolic heart failure (HCC)    a) ECHO (08/2013) EF 55-60% and RV function nl b) RHC (08/2013) RA 4, RV 30/5/7, PA 25/10 (16),  PCWP 7, Fick CO/CI 6.3/2.7, PVR 1.5 WU, PA 61 and 66%   Daily headache    "~ every other day; since I fell in June" (03/15/2014)   Depression    Diabetic retinopathy (HCC)    Dyslipidemia    ESRD (end stage renal disease) (HCC)    Dialysis on Tues Thurs Sat   Exertional shortness of breath    History of kidney stones    HTN (hypertension)    Hypothyroidism    Obesity    Osteoarthritis    PAF (paroxysmal atrial fibrillation) (HCC)    Peripheral neuropathy    bilateral feet/hands   PONV (postoperative nausea and vomiting)    RBBB (right bundle branch block)    Old   Stroke (HCC)    mini strokes   Type II diabetes mellitus (HCC)    Type II, [name]. patient has omnipod insulin pump with Novolin R Insulin    Past Surgical History:  Procedure Laterality Date   A/V FISTULAGRAM Left 11/07/2022   Procedure: A/V Fistulagram;  Surgeon: Leonie Douglas, MD;  [facility]: [facility];  Service: Cardiovascular;  Laterality: Left;   ABDOMINAL HYSTERECTOMY  1980's   AMPUTATION Right 02/24/2018   Procedure: RIGHT FOOT GREAT TOE AND 2ND TOE AMPUTATION;  Surgeon: Nadara Mustard, MD;  Location: [facility] OR;  Service: Orthopedics;  Laterality: Right;  AMPUTATION Right 04/30/2018   Procedure: RIGHT TRANSMETATARSAL AMPUTATION;  Surgeon: Nadara Mustard, MD;  Location: [facility] OR;  Service: Orthopedics;  Laterality: Right;   AMPUTATION Right 05/02/2022   Procedure: RIGHT BELOW KNEE AMPUTATION;  Surgeon: Nadara Mustard, MD;  Location: [facility] OR;  Service: Orthopedics;  Laterality: Right;   APPLICATION OF WOUND VAC Right 06/13/2022   Procedure: APPLICATION OF WOUND VAC;  Surgeon: Nadara Mustard, MD;  Location: [facility] OR;  Service: Orthopedics;  Laterality: Right;   APPLICATION OF WOUND VAC Left 11/14/2022   Procedure: APPLICATION OF WOUND VAC;  Surgeon: Nadara Mustard, MD;  Location: [facility] OR;  Service: Orthopedics;  Laterality: Left;   AV FISTULA PLACEMENT Left 04/02/2022   Procedure: LEFT ARM  ARTERIOVENOUS (AV) FISTULA CREATION;  Surgeon: Nada Libman, MD;  Location: [facility] OR;  Service: Vascular;  Laterality: Left;  PERIPHERAL NERVE BLOCK   BASCILIC VEIN TRANSPOSITION Left 07/31/2022   Procedure: LEFT ARM SECOND STAGE BASILIC VEIN TRANSPOSITION;  Surgeon: Nada Libman, MD;  [facility]: [facility];  Service: Vascular;  Laterality: Left;   BIOPSY  05/27/2020   Procedure: BIOPSY;  Surgeon: Lanelle Bal, DO;  [facility]: [facility];  Service: Endoscopy;;   CATARACT EXTRACTION, BILATERAL Bilateral ?2013   COLONOSCOPY W/ POLYPECTOMY     COLONOSCOPY WITH PROPOFOL N/A 03/13/2019   Procedure: COLONOSCOPY WITH PROPOFOL;  Surgeon: Beverley Fiedler, MD;  Location: [facility] ENDOSCOPY;  Service: Gastroenterology;  Laterality: N/A;   CORONARY ANGIOPLASTY WITH STENT PLACEMENT  1999; 2007   "1 + 1"   ERCP N/A 02/03/2022   Procedure: ENDOSCOPIC RETROGRADE CHOLANGIOPANCREATOGRAPHY (ERCP);  Surgeon: Jeani Hawking, MD;  Location: [facility] ENDOSCOPY;  Service: Gastroenterology;  Laterality: N/A;   ESOPHAGOGASTRODUODENOSCOPY (EGD) WITH PROPOFOL N/A 03/13/2019   Procedure: ESOPHAGOGASTRODUODENOSCOPY (EGD) WITH PROPOFOL;  Surgeon: Beverley Fiedler, MD;  Location: [facility] ENDOSCOPY;  Service: Gastroenterology;  Laterality: N/A;   ESOPHAGOGASTRODUODENOSCOPY (EGD) WITH PROPOFOL N/A 05/27/2020   Procedure: ESOPHAGOGASTRODUODENOSCOPY (EGD) WITH PROPOFOL;  Surgeon: Lanelle Bal, DO;  [facility];  Service: Endoscopy;  Laterality: N/A;   ESOPHAGOGASTRODUODENOSCOPY (EGD) WITH PROPOFOL N/A 09/03/2022   Procedure: ESOPHAGOGASTRODUODENOSCOPY (EGD) WITH PROPOFOL;  Surgeon: Jeani Hawking, MD;  Location: [facility] ENDOSCOPY;  Service: Gastroenterology;  Laterality: N/A;   EYE SURGERY Bilateral    lazer   FLEXIBLE SIGMOIDOSCOPY N/A 05/23/2022   Procedure: FLEXIBLE SIGMOIDOSCOPY;  Surgeon: Jeani Hawking, MD;  Location: [facility] ENDOSCOPY;  Service: Gastroenterology;  Laterality: N/A;   FLEXIBLE SIGMOIDOSCOPY N/A 05/24/2022   Procedure:  FLEXIBLE SIGMOIDOSCOPY;  Surgeon: Imogene Burn, MD;  Location: [facility] ENDOSCOPY;  Service: Gastroenterology;  Laterality: N/A;   FLEXIBLE SIGMOIDOSCOPY N/A 09/03/2022   Procedure: FLEXIBLE SIGMOIDOSCOPY;  Surgeon: Jeani Hawking, MD;  Location: [facility] ENDOSCOPY;  Service: Gastroenterology;  Laterality: N/A;   HEMOSTASIS CLIP PLACEMENT  03/13/2019   Procedure: HEMOSTASIS CLIP PLACEMENT;  Surgeon: Beverley Fiedler, MD;  Location: [facility] ENDOSCOPY;  Service: Gastroenterology;;   HEMOSTASIS CLIP PLACEMENT  05/23/2022   Procedure: HEMOSTASIS CLIP PLACEMENT;  Surgeon: Jeani Hawking, MD;  Location: [facility] ENDOSCOPY;  Service: Gastroenterology;;   HEMOSTASIS CONTROL  05/24/2022   Procedure: HEMOSTASIS CONTROL;  Surgeon: Imogene Burn, MD;  Location: [facility] ENDOSCOPY;  Service: Gastroenterology;;   HOT HEMOSTASIS N/A 05/23/2022   Procedure: HOT HEMOSTASIS (ARGON PLASMA COAGULATION/BICAP);  Surgeon: Jeani Hawking, MD;  Location: [facility] ENDOSCOPY;  Service: Gastroenterology;  Laterality: N/A;   I & D EXTREMITY Left 05/05/2022   Procedure: IRRIGATION AND DEBRIDEMENT LEFT ARM AV FISTULA;  Surgeon: Chestine Spore,  Canary Brim, MD;  Location: [facility] OR;  Service: Vascular;  Laterality: Left;   I & D EXTREMITY N/A 11/14/2022   Procedure: IRRIGATION AND DEBRIDEMENT OF LOWER EXTREMITY WOUND;  Surgeon: Nadara Mustard, MD;  Location: [facility] OR;  Service: Orthopedics;  Laterality: N/A;   INSERTION OF DIALYSIS CATHETER Right 04/02/2022   Procedure: INSERTION OF TUNNELED DIALYSIS CATHETER;  Surgeon: Nada Libman, MD;  Location: [facility] OR;  Service: Vascular;  Laterality: Right;   KNEE ARTHROSCOPY Left 10/25/2006   POLYPECTOMY  03/13/2019   Procedure: POLYPECTOMY;  Surgeon: Beverley Fiedler, MD;  [facility] ENDOS[facility];  Service: Gastroenterology;;   REMOVAL OF STONES  02/03/2022   Procedure: REMOVAL OF STONES;  Surgeon: Jeani Hawking, MD;  Location: [facility] ENDOSCOPY;  Service: Gastroenterology;;   REVISON OF ARTERIOVENOUS FISTULA Left 08/20/2022   Procedure: REVISON  OF LEFT ARM ARTERIOVENOUS FISTULA;  Surgeon: Nada Libman, MD;  [facility];  Service: Vascular;  Laterality: Left;   RIGHT HEART CATH N/A 07/24/2017   Procedure: RIGHT HEART CATH;  Surgeon: Dolores Patty, MD;  [facility];  Service: Cardiovascular;  Laterality: N/A;   RIGHT HEART CATHETERIZATION N/A 09/22/2013   Procedure: RIGHT HEART CATH;  Surgeon: Dolores Patty, MD;  Location: [facility] CATH LAB;  Service: Cardiovascular;  Laterality: N/A;   SHOULDER ARTHROSCOPY WITH OPEN ROTATOR CUFF REPAIR Right 03/14/2014   Procedure: RIGHT SHOULDER ARTHROSCOPY WITH BICEPS RELEASE, OPEN SUBSCAPULA REPAIR, OPEN SUPRASPINATUS REPAIR.;  Surgeon: Cammy Copa, MD;  Location: [facility] OR;  Service: Orthopedics;  Laterality: Right;   SPHINCTEROTOMY  02/03/2022   Procedure: SPHINCTEROTOMY;  Surgeon: Jeani Hawking, MD;  Location: [facility] ENDOSCOPY;  Service: Gastroenterology;;   STUMP REVISION Right 06/13/2022   Procedure: REVISION RIGHT BELOW KNEE AMPUTATION;  Surgeon: Nadara Mustard, MD;  Location: [facility] OR;  Service: Orthopedics;  Laterality: Right;   TEE WITHOUT CARDIOVERSION N/A 02/04/2022   Procedure: TRANSESOPHAGEAL ECHOCARDIOGRAM (TEE);  Surgeon: Dolores Patty, MD;  Location: [facility] ENDOSCOPY;  Service: Cardiovascular;  Laterality: N/A;   THROMBECTOMY W/ EMBOLECTOMY Left 08/20/2022   Procedure: THROMBECTOMY OF LEFT ARM ARTERIOVENOUS FISTULA;  Surgeon: Nada Libman, MD;  Location[facility] OR;  Service: Vascular;  Laterality: Left;   TOE AMPUTATION Right 02/24/2018   GREAT TOE AND 2ND TOE AMPUTATION   TUBAL LIGATION  1970's     reports that she quit smoking about 25 years ago. Her smoking use included cigarettes. She started smoking about 57 years ago. She has a 96 pack-year smoking history. She has never used smokeless tobacco. She reports that she does not currently use alcohol. She reports that she does not use drugs.  Allergies  Allergen Reactions   Cephalexin Diarrhea and Other (See  Comments)   Codeine Nausea And Vomiting and Other (See Comments)    Family History  Problem Relation Age of Onset   Heart attack Mother 13    Prior to Admission medications   Medication Sig Start Date End Date Taking? Authorizing Provider  acetaminophen (TYLENOL) 325 MG tablet Take 2 tablets (650 mg total) by mouth every 6 (six) hours as needed for mild pain (or Fever >/= 101). 12/10/22   Angiulli, Mcarthur Rossetti, PA-C  albuterol (VENTOLIN HFA) 108 (90 Base) MCG/ACT inhaler Inhale 1-2 puffs into the lungs every 6 (six) hours as needed for wheezing or shortness of breath. 12/11/22   Angiulli, Mcarthur Rossetti, PA-C  amiodarone (PACERONE) 200 MG tablet Take 1 tablet (200 mg total) by mouth daily. 12/11/22  Angiulli, Mcarthur Rossetti, PA-C  apixaban (ELIQUIS) 5 MG TABS tablet Take 1 tablet (5 mg total) by mouth 2 (two) times daily. 12/11/22   Angiulli, Mcarthur Rossetti, PA-C  ascorbic acid (VITAMIN C) 500 MG tablet Take 1 tablet (500 mg total) by mouth daily. 12/11/22   Angiulli, Mcarthur Rossetti, PA-C  atorvastatin (LIPITOR) 80 MG tablet TAKE 1 TABLET BY MOUTH EVERY DAY 12/26/22   Bensimhon, Bevelyn Buckles, MD  Calcium Carbonate Antacid (CALCIUM CARBONATE, DOSED IN MG ELEMENTAL CALCIUM,) 1250 MG/5ML SUSP Take 5 mLs (500 mg of elemental calcium total) by mouth every 6 (six) hours as needed for indigestion. 12/10/22   Angiulli, Mcarthur Rossetti, PA-C  carvedilol (COREG) 6.25 MG tablet Take 6.25 mg by mouth 2 (two) times daily with a meal. 08/11/22   [provider]  cefdinir (OMNICEF) 300 MG capsule Take 1 capsule (300 mg total) by mouth every other day. 01/17/23   Danford, Earl Lites, MD  clonazePAM (KLONOPIN) 0.5 MG disintegrating tablet Take 1 tablet (0.5 mg total) by mouth 2 (two) times daily. 01/19/23   Donita Brooks, MD  folic acid (FOLVITE) 1 MG tablet Take 1 tablet (1 mg total) by mouth daily. 01/09/23   Donita Brooks, MD  insulin aspart (NOVOLOG FLEXPEN) 100 UNIT/ML FlexPen Inject 0-15 Units into the skin See admin instructions.  Inject 12 units subcutaneously 3 times daily with meals (0900, 1300 & 1700) & as needed via sliding scale insulin CBG <70=Notify NP/MD 71-149=0 units,150-199=4 units, 200-249=6 units,250- 299=10 units, 300-349=12 units, 350-399=15 units, > 400 call MD ( prime pen with 2 units prior  to set dose) Patient taking differently: Inject 0-16 Units into the skin 3 (three) times daily with meals. 09/05/22   Leroy Sea, MD  insulin glargine (LANTUS) 100 UNIT/ML Solostar Pen Inject 12 units into the skin at bedtime (Max 40 units) 12/11/22   Angiulli, Mcarthur Rossetti, PA-C  levothyroxine (SYNTHROID) 75 MCG tablet Take 1 tablet (75 mcg total) by mouth daily before breakfast. 12/11/22   Angiulli, Mcarthur Rossetti, PA-C  methocarbamol (ROBAXIN) 750 MG tablet Take 1 tablet (750 mg total) by mouth every 6 (six) hours as needed for muscle spasms. 12/11/22   Angiulli, Mcarthur Rossetti, PA-C  metoCLOPramide (REGLAN) 5 MG tablet Take 1 tablet (5 mg total) by mouth 3 (three) times daily before meals. 01/09/23   Donita Brooks, MD  midodrine (PROAMATINE) 10 MG tablet Take 1 tablet (10 mg total) by mouth Every Tuesday,Thursday,and Saturday with dialysis. 12/11/22   Angiulli, Mcarthur Rossetti, PA-C  pantoprazole (PROTONIX) 40 MG tablet Take 1 tablet (40 mg total) by mouth 2 (two) times daily before a meal. 01/09/23 01/09/24  Donita Brooks, MD  polyethylene glycol (MIRALAX / GLYCOLAX) 17 g packet Take 17 g by mouth daily. 12/10/22   Angiulli, Mcarthur Rossetti, PA-C  ramelteon (ROZEREM) 8 MG tablet Take 1 tablet (8 mg total) by mouth at bedtime. 01/19/23   Donita Brooks, MD  senna-docusate (SENOKOT-S) 8.6-50 MG tablet Take 2 tablets by mouth at bedtime. Patient taking differently: Take 2 tablets by mouth at bedtime as needed for mild constipation. 12/10/22   Angiulli, Mcarthur Rossetti, PA-C  sevelamer carbonate (RENVELA) 800 MG tablet Take 2 tablets (1,600 mg total) by mouth 3 (three) times daily with meals. (0800, 1200 & 1700) 12/11/22   Angiulli, Mcarthur Rossetti, PA-C   venlafaxine XR (EFFEXOR-XR) 75 MG 24 hr capsule Take 1 capsule (75 mg total) by mouth daily with breakfast. Stop trintellix 01/05/23   [name],  Priscille Heidelberg, MD  Zinc Sulfate 220 (50 Zn) MG TABS Take 1 tablet (220 mg total) by mouth daily. 12/11/22   Charlton Amor, PA-C    Physical Exam: Vitals:   01/20/23 0624 01/20/23 0630 01/20/23 0645 01/20/23 0719  BP:  (!) 170/87 (!) 176/84 (!) 164/80  Pulse:  81 83 89  Resp:   12 18  Temp:    (!) 97.5 F (36.4 C)  TempSrc:    Axillary  SpO2: 100% 100% 100% 97%  Weight:        Constitutional: NAD, calm, comfortable Vitals:   01/20/23 0624 01/20/23 0630 01/20/23 0645 01/20/23 0719  BP:  (!) 170/87 (!) 176/84 (!) 164/80  Pulse:  81 83 89  Resp:   12 18  Temp:    (!) 97.5 F (36.4 C)  TempSrc:    Axillary  SpO2: 100% 100% 100% 97%  Weight:       Eyes: PERRL, lids and conjunctivae normal ENMT: Mucous membranes are moist. Posterior pharynx clear of any exudate or lesions.Normal dentition.  Neck: normal, supple, no masses, no thyromegaly Respiratory: clear to auscultation bilaterally, no wheezing, no crackles. Normal respiratory effort. No accessory muscle use.  Cardiovascular: Regular rate and rhythm, no murmurs / rubs / gallops. No extremity edema. 2+ pedal pulses. Abdomen: no tenderness, no masses palpated. No hepatosplenomegaly. Bowel sounds positive.  Musculoskeletal: no clubbing / cyanosis. Right bka,  Normal muscle tone.  Skin: no rashes, lesions, ulcers. No induration Neurologic: CN 2-12 grossly intact. Sensation intact,  Strength 5/5 in all 4.  Psychiatric: Normal judgment and insight. Alert and oriented x 3. Normal mood.    Labs on Admission: I have personally reviewed following labs and imaging studies  CBC: Recent Labs  Lab 01/15/23 0513 01/16/23 0404 01/17/23 0427 01/20/23 0302  WBC 8.6 10.4 7.0 10.5  HGB 10.6* 10.1* 9.2* 11.2*  HCT 35.9* 33.1* 29.2* 37.4  MCV 75.6* 75.6* 74.1* 74.2*  PLT 277 252 221 338   Basic  Metabolic Panel: Recent Labs  Lab 01/15/23 0813 01/15/23 1514 01/16/23 0404 01/17/23 0427 01/20/23 0302  NA 124* 127* 133* 127* 132*  K 4.0 3.7 3.4* 3.9 4.1  CL 86* 90* 95* 91* 92*  CO2 23 19* 28 21* 21*  GLUCOSE 692* 407* 170* 177* 309*  BUN 54* 57* 28* 52* 60*  CREATININE 4.34* 4.30* 2.70* 4.08* 5.12*  CALCIUM 8.7* 8.9 8.7* 8.3* 9.8  PHOS  --  5.2* 3.4 4.8*  --    GFR: Estimated Creatinine Clearance: 10.2 mL/min (A) (by C-G formula based on SCr of 5.12 mg/dL (H)). Liver Function Tests: Recent Labs  Lab 01/15/23 0513 01/16/23 0404 01/17/23 0427 01/20/23 0302  AST 19  --   --  15  ALT 23  --   --  18  ALKPHOS 300*  --   --  243*  BILITOT 0.8  --   --  0.5  PROT 7.4  --   --  7.4  ALBUMIN 3.0* 2.4* 2.4* 3.2*   Recent Labs  Lab 01/15/23 0513 01/20/23 0302  LIPASE 29 31   No results for input(s): "AMMONIA" in the last 168 hours. Coagulation Profile: No results for input(s): "INR", "PROTIME" in the last 168 hours. Cardiac Enzymes: No results for input(s): "CKTOTAL", "CKMB", "CKMBINDEX", "TROPONINI" in the last 168 hours. BNP (last 3 results) No results for input(s): "PROBNP" in the last 8760 hours. HbA1C: No results for input(s): "HGBA1C" in the last 72 hours. CBG: Recent Labs  Lab  01/16/23 1707 01/16/23 2128 01/17/23 0623 01/17/23 1300 01/20/23 0354  GLUCAP 398* 328* 214* 139* 280*   Lipid Profile: No results for input(s): "CHOL", "HDL", "LDLCALC", "TRIG", "CHOLHDL", "LDLDIRECT" in the last 72 hours. Thyroid Function Tests: No results for input(s): "TSH", "T4TOTAL", "FREET4", "T3FREE", "THYROIDAB" in the last 72 hours. Anemia Panel: No results for input(s): "VITAMINB12", "FOLATE", "FERRITIN", "TIBC", "IRON", "RETICCTPCT" in the last 72 hours. Urine analysis:    Component Value Date/Time   COLORURINE AMBER (A) 08/29/2022 0528   APPEARANCEUR HAZY (A) 08/29/2022 0528   LABSPEC 1.024 08/29/2022 0528   PHURINE 5.0 08/29/2022 0528   GLUCOSEU 150 (A)  08/29/2022 0528   HGBUR SMALL (A) 08/29/2022 0528   BILIRUBINUR SMALL (A) 08/29/2022 0528   BILIRUBINUR negative 06/05/2020 1334   KETONESUR 5 (A) 08/29/2022 0528   PROTEINUR 100 (A) 08/29/2022 0528   UROBILINOGEN 0.2 06/05/2020 1334   UROBILINOGEN 0.2 04/01/2014 1659   NITRITE NEGATIVE 08/29/2022 0528   LEUKOCYTESUR MODERATE (A) 08/29/2022 0528    Radiological Exams on Admission: CT ABDOMEN PELVIS W CONTRAST  Result Date: 01/20/2023 CLINICAL DATA:  Right lower quadrant abdominal pain EXAM: CT ABDOMEN AND PELVIS WITH CONTRAST TECHNIQUE: Multidetector CT imaging of the abdomen and pelvis was performed using the standard protocol following bolus administration of intravenous contrast. RADIATION DOSE REDUCTION: This exam was performed according to the departmental dose-optimization program which includes automated exposure control, adjustment of the mA and/or kV according to patient size and/or use of iterative reconstruction technique. CONTRAST:  75mL OMNIPAQUE IOHEXOL 350 MG/ML SOLN COMPARISON:  01/15/2023 FINDINGS: Lower chest: Interstitial lobular septal thickening and small pleural effusions at the lung bases. Coronary atherosclerosis. Hepatobiliary: Lobulated appearance of the inferior lobe right liver but no generalized surface lobulation or other typical morphologic changes of cirrhosis.Layering gallstones. No evidence of biliary inflammation or ductal dilatation. Pancreas: Unremarkable. Spleen: Hypoenhancing areas in the anterior spleen are stable and most consistent with infarct. Scarring in the subcapsular high left spleen Adrenals/Urinary Tract: Negative adrenals. No hydronephrosis or stone. Renal cortical lobulation likely from scarring, especially on the right. Unremarkable bladder. Stomach/Bowel: No evidence of bowel obstruction or inflammation. No convincing bowel wall thickening aside from areas of luminal collapse. No appendicitis. Numerous distal colonic diverticula.  Vascular/Lymphatic: No acute vascular abnormality. Extensive atheromatous calcification of the aorta and branch vessels. No mass or adenopathy. Reproductive:Hysterectomy Other: No ascites or pneumoperitoneum. Musculoskeletal: No acute abnormalities. IMPRESSION: 1. No specific cause for right lower quadrant pain. No appendicitis. 2. Pulmonary edema and small pleural effusions seen at the lung bases 3. No progression of small splenic infarcts since prior. 4. Cholelithiasis, colonic diverticulosis, and extensive atheromatous calcification. Electronically Signed   By: Tiburcio Pea M.D.   On: 01/20/2023 07:16   US Abdomen Limited RUQ (LIVER/GB)  Result Date: 01/20/2023 CLINICAL DATA:  Right upper quadrant abdominal pain EXAM: ULTRASOUND ABDOMEN LIMITED RIGHT UPPER QUADRANT COMPARISON:  01/15/2023 FINDINGS: Gallbladder: No gallbladder wall thickening, sludge or pericholecystic fluid. Gallstones are identified which measure up to 1.5 cm. Negative sonographic Murphy's sign. Common bile duct: Diameter: 5.1 mm Liver: No focal lesion identified. Within normal limits in parenchymal echogenicity. Portal vein is patent on color Doppler imaging with normal direction of blood flow towards the liver. Other: None. IMPRESSION: Cholelithiasis. No secondary signs of acute cholecystitis. Electronically Signed   By: Signa Kell M.D.   On: 01/20/2023 05:46   DG Chest Portable 1 View  Result Date: 01/20/2023 CLINICAL DATA:  Shortness of breath.  Nausea and vomiting. EXAM: PORTABLE  CHEST 1 VIEW COMPARISON:  01/15/2023. FINDINGS: Heart is enlarged and the mediastinal contour is within normal limits. There is atherosclerotic calcification of the aorta. The pulmonary vasculature is distended. Interstitial prominence is present bilaterally. There are small bilateral pleural effusions. No pneumothorax is seen. A right internal jugular central venous catheter is stable. No acute osseous abnormality. IMPRESSION: 1. Cardiomegaly with  pulmonary vascular congestion. 2. Interstitial prominence bilaterally, likely edema, not significantly changed from the prior exam. 3. Small bilateral pleural effusions. Electronically Signed   By: Thornell Sartorius M.D.   On: 01/20/2023 04:18    EKG: Independently reviewed. See above  Assessment/Plan  Intractable nausea and vomiting -insetting of recent treatment for colitis  - CT abd/pelvis notes total resolution of colitis -concern for possible gastroparesis  - supportive care  - PPI iv q24h -ativan prn -reglan once QT less than 480 - GI consult for further assistance    Gallstone -no findings of acute cholecystitis  - HIDA scan pending due to recurrent symptomatology    sCHF EF 30-35% -fluid management with HD  -patient has TTS - patient will undergo HD today   Paroxysmal Atrial fib  -continue OAC  -resume coreg   CAD s/p MI s/p stent -no acute issues  -resume home regimen   Hypertension -borderline control  --resume home regimen  -prn    Anemia of Chronic disease  -h/h currently stable   Hypothyroidism - resume synthroid   Hyperlipidemia -continue atorvastatin    GERD -continue protonix   PVD s/p BKA   Chronic debility  -wheelchair bound   Morbid obesity  Complicates overall prognosis and care Lifestyle modification and exercise has been discussed with patient in detail    CAD -no active issue -resume home regimen  DMII Iss/fs Resume long acting   DVT prophylaxis: OAC Code Status: full/ as discussed per patient wishes in event of cardiac arrest  Family Communication: none at bedside Disposition Plan: patient  expected to be admitted greater than 2 midnights  Consults called: GI , Renal  Admission status: med tele   Lurline Del MD [facility]   If 7PM-7AM, please contact night-coverage [email or web address] Password [facility]  01/20/2023, 9:24 AM

## 2023-01-20 NOTE — ED Notes (Signed)
Pt reports feeling "hotter" than she did previously. Visible Increased diaphoresis.  Unable to obtain oral temp d/t thermometers not reading x2, Axillary temp repeated 97.3, Pt refuses rectal temp. Provider aware.

## 2023-01-20 NOTE — ED Provider Notes (Signed)
[place] EMERGENCY DEPARTMENT AT [facility] Provider Note   CSN: [id number] Arrival date & time: 01/20/23  0251     History  Chief Complaint  Patient presents with   Nausea   Emesis    Susan [name] is a 71 y.o. female.  The history is provided by the patient.  Patient with extensive history including end-stage renal disease, diabetes, paroxysmal atrial fibrillation, right BKA, previous maggot infestation presents with nausea vomiting and abdominal pain.  Reports several hours ago she began having generalized abdominal pain with associated nonbloody emesis.  No fevers.  No recent diarrhea.  No chest pain but she does report shortness of breath.  Reports her last dialysis session was on August 24 and she is due later today. She reports over the past month she has had these episodes of abdominal pain and vomiting frequently.  She has had multiple workups recently for the same issue.  Patient was just recently admitted this month for uncontrolled diabetes with hyperglycemia     Home Medications Prior to Admission medications   Medication Sig Start Date End Date Taking? Authorizing Provider  acetaminophen (TYLENOL) 325 MG tablet Take 2 tablets (650 mg total) by mouth every 6 (six) hours as needed for mild pain (or Fever >/= 101). 12/10/22   Angiulli, Mcarthur Rossetti, PA-C  albuterol (VENTOLIN HFA) 108 (90 Base) MCG/ACT inhaler Inhale 1-2 puffs into the lungs every 6 (six) hours as needed for wheezing or shortness of breath. 12/11/22   Angiulli, Mcarthur Rossetti, PA-C  amiodarone (PACERONE) 200 MG tablet Take 1 tablet (200 mg total) by mouth daily. 12/11/22   Angiulli, Mcarthur Rossetti, PA-C  apixaban (ELIQUIS) 5 MG TABS tablet Take 1 tablet (5 mg total) by mouth 2 (two) times daily. 12/11/22   Angiulli, Mcarthur Rossetti, PA-C  ascorbic acid (VITAMIN C) 500 MG tablet Take 1 tablet (500 mg total) by mouth daily. 12/11/22   Angiulli, Mcarthur Rossetti, PA-C  atorvastatin (LIPITOR) 80 MG tablet TAKE 1 TABLET BY MOUTH EVERY  DAY 12/26/22   Bensimhon, Bevelyn Buckles, MD  Calcium Carbonate Antacid (CALCIUM CARBONATE, DOSED IN MG ELEMENTAL CALCIUM,) 1250 MG/5ML SUSP Take 5 mLs (500 mg of elemental calcium total) by mouth every 6 (six) hours as needed for indigestion. 12/10/22   Angiulli, Mcarthur Rossetti, PA-C  carvedilol (COREG) 6.25 MG tablet Take 6.25 mg by mouth 2 (two) times daily with a meal. 08/11/22   [provider]  cefdinir (OMNICEF) 300 MG capsule Take 1 capsule (300 mg total) by mouth every other day. 01/17/23   Danford, Earl Lites, MD  clonazePAM (KLONOPIN) 0.5 MG disintegrating tablet Take 1 tablet (0.5 mg total) by mouth 2 (two) times daily. 01/19/23   Donita Brooks, MD  folic acid (FOLVITE) 1 MG tablet Take 1 tablet (1 mg total) by mouth daily. 01/09/23   Donita Brooks, MD  insulin aspart (NOVOLOG FLEXPEN) 100 UNIT/ML FlexPen Inject 0-15 Units into the skin See admin instructions. Inject 12 units subcutaneously 3 times daily with meals (0900, 1300 & 1700) & as needed via sliding scale insulin CBG <70=Notify NP/MD 71-149=0 units,150-199=4 units, 200-249=6 units,250- 299=10 units, 300-349=12 units, 350-399=15 units, > 400 call MD ( prime pen with 2 units prior  to set dose) Patient taking differently: Inject 0-16 Units into the skin 3 (three) times daily with meals. 09/05/22   Leroy Sea, MD  insulin glargine (LANTUS) 100 UNIT/ML Solostar Pen Inject 12 units into the skin at bedtime (Max 40 units) 12/11/22  Angiulli, Mcarthur Rossetti, PA-C  levothyroxine (SYNTHROID) 75 MCG tablet Take 1 tablet (75 mcg total) by mouth daily before breakfast. 12/11/22   Angiulli, Mcarthur Rossetti, PA-C  methocarbamol (ROBAXIN) 750 MG tablet Take 1 tablet (750 mg total) by mouth every 6 (six) hours as needed for muscle spasms. 12/11/22   Angiulli, Mcarthur Rossetti, PA-C  metoCLOPramide (REGLAN) 5 MG tablet Take 1 tablet (5 mg total) by mouth 3 (three) times daily before meals. 01/09/23   Donita Brooks, MD  midodrine (PROAMATINE) 10 MG tablet Take 1  tablet (10 mg total) by mouth Every Tuesday,Thursday,and Saturday with dialysis. 12/11/22   Angiulli, Mcarthur Rossetti, PA-C  pantoprazole (PROTONIX) 40 MG tablet Take 1 tablet (40 mg total) by mouth 2 (two) times daily before a meal. 01/09/23 01/09/24  Donita Brooks, MD  polyethylene glycol (MIRALAX / GLYCOLAX) 17 g packet Take 17 g by mouth daily. 12/10/22   Angiulli, Mcarthur Rossetti, PA-C  ramelteon (ROZEREM) 8 MG tablet Take 1 tablet (8 mg total) by mouth at bedtime. 01/19/23   Donita Brooks, MD  senna-docusate (SENOKOT-S) 8.6-50 MG tablet Take 2 tablets by mouth at bedtime. Patient taking differently: Take 2 tablets by mouth at bedtime as needed for mild constipation. 12/10/22   Angiulli, Mcarthur Rossetti, PA-C  sevelamer carbonate (RENVELA) 800 MG tablet Take 2 tablets (1,600 mg total) by mouth 3 (three) times daily with meals. (0800, 1200 & 1700) 12/11/22   Angiulli, Mcarthur Rossetti, PA-C  venlafaxine XR (EFFEXOR-XR) 75 MG 24 hr capsule Take 1 capsule (75 mg total) by mouth daily with breakfast. Stop trintellix 01/05/23   Donita Brooks, MD  Zinc Sulfate 220 (50 Zn) MG TABS Take 1 tablet (220 mg total) by mouth daily. 12/11/22   Angiulli, Mcarthur Rossetti, PA-C      Allergies    Cephalexin and Codeine    Review of Systems   Review of Systems  Constitutional:  Negative for fever.  Respiratory:  Positive for shortness of breath.   Cardiovascular:  Negative for chest pain.  Gastrointestinal:  Positive for abdominal pain, nausea and vomiting. Negative for blood in stool, constipation and diarrhea.  Genitourinary:  Negative for dysuria.    Physical Exam Updated Vital Signs BP (!) 176/84   Pulse 83   Temp (!) 97.3 F (36.3 C) (Axillary)   Resp 12   Wt 80 kg   SpO2 100%   BMI 29.35 kg/m  Physical Exam CONSTITUTIONAL: Chronically ill-appearing, appears uncomfortable HEAD: Normocephalic/atraumatic EYES: EOMI/PERRL ENMT: Mucous membranes moist NECK: supple no meningeal signs CV: S1/S2 noted, soft murmur  noted LUNGS: Lungs are clear to auscultation bilaterally, no apparent distress ABDOMEN: soft, obese, diffuse abdominal tenderness, bowel sounds present GU:no cva tenderness NEURO: Pt is awake/alert/appropriate EXTREMITIES: Pulses are equal in both upper extremities.  Patient with previous right BKA.  Left lower extremity is wrapped with gause SKIN: warm, color normal PSYCH: anxious ED Results / Procedures / Treatments   Labs (all labs ordered are listed, but only abnormal results are displayed) Labs Reviewed  COMPREHENSIVE METABOLIC PANEL - Abnormal; Notable for the following components:      Result Value   Sodium 132 (*)    Chloride 92 (*)    CO2 21 (*)    Glucose, Bld 309 (*)    BUN 60 (*)    Creatinine, Ser 5.12 (*)    Albumin 3.2 (*)    Alkaline Phosphatase 243 (*)    GFR, Estimated 8 (*)    Anion gap  19 (*)    All other components within normal limits  CBC - Abnormal; Notable for the following components:   Hemoglobin 11.2 (*)    MCV 74.2 (*)    MCH 22.2 (*)    MCHC 29.9 (*)    RDW 17.8 (*)    All other components within normal limits  CBG MONITORING, ED - Abnormal; Notable for the following components:   Glucose-Capillary 280 (*)    All other components within normal limits  LIPASE, BLOOD    EKG ED ECG REPORT   Date: 01/20/2023 0317  Rate: 81  Rhythm: normal sinus rhythm  QRS Axis: left  Intervals: normal  ST/T Wave abnormalities: nonspecific ST changes  Conduction Disutrbances:nonspecific intraventricular conduction delay  Narrative Interpretation:   Old EKG Reviewed: unchanged  I have personally reviewed the EKG tracing and agree with the computerized printout as noted.  Radiology US Abdomen Limited RUQ (LIVER/GB)  Result Date: 01/20/2023 CLINICAL DATA:  Right upper quadrant abdominal pain EXAM: ULTRASOUND ABDOMEN LIMITED RIGHT UPPER QUADRANT COMPARISON:  01/15/2023 FINDINGS: Gallbladder: No gallbladder wall thickening, sludge or pericholecystic fluid.  Gallstones are identified which measure up to 1.5 cm. Negative sonographic Murphy's sign. Common bile duct: Diameter: 5.1 mm Liver: No focal lesion identified. Within normal limits in parenchymal echogenicity. Portal vein is patent on color Doppler imaging with normal direction of blood flow towards the liver. Other: None. IMPRESSION: Cholelithiasis. No secondary signs of acute cholecystitis. Electronically Signed   By: Signa Kell M.D.   On: 01/20/2023 05:46   DG Chest Portable 1 View  Result Date: 01/20/2023 CLINICAL DATA:  Shortness of breath.  Nausea and vomiting. EXAM: PORTABLE CHEST 1 VIEW COMPARISON:  01/15/2023. FINDINGS: Heart is enlarged and the mediastinal contour is within normal limits. There is atherosclerotic calcification of the aorta. The pulmonary vasculature is distended. Interstitial prominence is present bilaterally. There are small bilateral pleural effusions. No pneumothorax is seen. A right internal jugular central venous catheter is stable. No acute osseous abnormality. IMPRESSION: 1. Cardiomegaly with pulmonary vascular congestion. 2. Interstitial prominence bilaterally, likely edema, not significantly changed from the prior exam. 3. Small bilateral pleural effusions. Electronically Signed   By: Thornell Sartorius M.D.   On: 01/20/2023 04:18    Procedures Procedures    Medications Ordered in ED Medications  LORazepam (ATIVAN) injection 0.5 mg (0.5 mg Intravenous Given 01/20/23 0409)  ondansetron (ZOFRAN) injection 4 mg (4 mg Intravenous Given 01/20/23 0615)    ED Course/ Medical Decision Making/ A&P Clinical Course as of 01/20/23 0704  Tue Jan 20, 2023  0345 Patient with plethora of medical conditions presents with nausea vomiting abdominal pain.  Patient was just recently in the hospital for uncontrolled hyperglycemia.  Now having recurrent abdominal pain and nausea vomiting, but she reports she has had this pain previously.  Patient had extensive workup including CT  imaging, ultrasounds and gastroenterology evaluation.  Recently found to have proctocolitis [DW]  0346 Patient requesting medicines for anxiety [DW]  0429 Glucose(!): 309 Hyperglycemia [DW]  0429 Creatinine(!): 5.12 Chronic renal failure [DW]  0449 Patient reports she still continues to feel terrible, but is declining any new medicines.  On repeat exam most of her pain is in the right upper quadrant.  Otherwise her abdomen is soft and nontender.  Patient has known cholelithiasis, will obtain ultrasound to evaluate for cholecystitis [DW]  0607 Patient reports she still feels nauseous and having abdominal pain.  Ultrasound reveals cholelithiasis without complicating features.  Patient reports she cannot go  home.  She is now requesting medicines for nausea.  Will proceed with CT imaging due to abdominal pain of unclear etiology [DW]  0703 At signout to Dr. Karene Fry, f/u on CT imaging and likely admit for intractable nausea and dialysis [DW]    Clinical Course User Index [DW] Zadie Rhine, MD                                 Medical Decision Making Amount and/or Complexity of Data Reviewed Labs: ordered. Decision-making details documented in ED Course. Radiology: ordered. ECG/medicine tests: ordered.  Risk Prescription drug management.   This patient presents to the ED for concern of abdominal pain and vomiting, this involves an extensive number of treatment options, and is a complaint that carries with it a high risk of complications and morbidity.  The differential diagnosis includes but is not limited to cholecystitis, cholelithiasis, pancreatitis, gastritis, peptic ulcer disease, appendicitis, bowel obstruction, bowel perforation, diverticulitis, AAA, ischemic bowel, ACS    Comorbidities that complicate the patient evaluation: Patient's presentation is complicated by their history of ESRD, diabetes, atrial fibrillation, CHF  Social Determinants of Health: Patient's  poor mobility  and frequent ED visits   increases the complexity of managing their presentation  Additional history obtained: Additional history obtained from spouse Records reviewed previous admission documents  Lab Tests: I Ordered, and personally interpreted labs.  The pertinent results include: Chronic renal failure  Imaging Studies ordered: I ordered imaging studies including X-ray chest   I independently visualized and interpreted imaging which showed cardiomegaly and congestion I agree with the radiologist interpretation  Abdominal ultrasound reveals cholelithiasis without cholecystitis  Cardiac Monitoring: The patient was maintained on a cardiac monitor.  I personally viewed and interpreted the cardiac monitor which showed an underlying rhythm of:    Medicines ordered and prescription drug management: I ordered medication including Ativan for nausea Reevaluation of the patient after these medicines showed that the patient    improved  Reevaluation: After the interventions noted above, I reevaluated the patient and found that they have :stayed the same  Complexity of problems addressed: Patient's presentation is most consistent with  acute presentation with potential threat to life or bodily function           Final Clinical Impression(s) / ED Diagnoses Final diagnoses:  Nausea  ESRD (end stage renal disease) (HCC)  Hyperglycemia  Calculus of gallbladder without cholecystitis without obstruction    Rx / DC Orders ED Discharge Orders     None         Zadie Rhine, MD 01/20/23 [phone]

## 2023-01-20 NOTE — Progress Notes (Signed)
Pt receives out-pt HD at [facility] TTS. Will assist as needed.   Olivia Canter Renal Navigator [phone]

## 2023-01-20 NOTE — Inpatient Diabetes Management (Signed)
Inpatient Diabetes Program Recommendations  AACE/ADA: New Consensus Statement on Inpatient Glycemic Control (2015)  Target Ranges:  Prepandial:   less than 140 mg/dL      Peak postprandial:   less than 180 mg/dL (1-2 hours)      Critically ill patients:  140 - 180 mg/dL   Lab Results  Component Value Date   GLUCAP 280 (H) 01/20/2023   HGBA1C 7.9 (H) 08/27/2022    Review of Glycemic Control  Latest Reference Range & Units 01/20/23 03:54  Glucose-Capillary 70 - 99 mg/dL 161 (H)    Diabetes history: DM 2 Outpatient Diabetes medications: Novolog sliding scale: <100 - 0 units, 100-150 - give 5 units, 151-200 -  give 10 units, 201-150 -  give 15 units, >250 -give 18 units, <300  Lantus 13 units, >300 give Lantus 26 units Current orders for Inpatient glycemic control:  IV insulin Endotool Note elevated renal function/ESRD dialysis TTS  Endocrinologist: Dr. Talmage Nap last visit less than 1 month ago Sees Endocrinology every 3 months   Inpatient Diabetes Program Recommendations:    -   Start Semglee 20 units -   Novolog 0-6 units tid + hs  -   Novolog 5 units tid meal coverage if eating >50% of meals (when eating)   Thanks,  Christena Deem RN, MSN, BC-ADM Inpatient Diabetes Coordinator Team Pager [phone] (8a-5p)

## 2023-01-20 NOTE — ED Triage Notes (Signed)
Patient recently admitted for n/v and reports that she started having n/v again today.  Patient believes she's having a reaction to the insulin she's on.

## 2023-01-20 NOTE — Consult Note (Signed)
[facility] Renal Consultation Note  Requesting MD: Ernie Avena, MD and Skip Mayer, MD Indication for Consultation:  ESRD  Chief complaint: n/v  HPI:  Susan [name] is a 72 y.o. female with a history of ESRD on hemodialysis Tuesday Thursday Saturday, coronary artery disease, chronic diastolic CHF, hypertension, paroxysmal atrial fibrillation, and type 2 diabetes mellitus who presented to the hospital with uncontrolled nausea and vomiting.  The evaluating provider in the ER is concerned about gastroparesis or possible cholecystitis.  She had gallstones on imaging and was tender right upper quadrant.  Nephrology is consulted for assistance with management of hemodialysis.  Note that she had a recent admission on 8/22 - 8/24 for proctitis and uncontrolled diabetes.  At that time her dry weight was lowered to 79 kg from 83 kg.  She states that she went to HD outpatient on Saturday - cannot open on two browsers.  She states blood sugars are 200-300 but states that this is better than the "700's before".     PMHx:   Past Medical History:  Diagnosis Date   Anemia    hx   Anxiety    Arthritis    "generalized" (03/15/2014)   CAD (coronary artery disease)    MI in 2000 - MI  2007 - treated bare metal stent (no nuclear since then as 9/11)   Carotid artery disease (HCC)    Chronic diastolic heart failure (HCC)    a) ECHO (08/2013) EF 55-60% and RV function nl b) RHC (08/2013) RA 4, RV 30/5/7, PA 25/10 (16), PCWP 7, Fick CO/CI 6.3/2.7, PVR 1.5 WU, PA 61 and 66%   Daily headache    "~ every other day; since I fell in June" (03/15/2014)   Depression    Diabetic retinopathy (HCC)    Dyslipidemia    ESRD (end stage renal disease) (HCC)    Dialysis on Tues Thurs Sat   Exertional shortness of breath    History of kidney stones    HTN (hypertension)    Hypothyroidism    Obesity    Osteoarthritis    PAF (paroxysmal atrial fibrillation) (HCC)    Peripheral neuropathy     bilateral feet/hands   PONV (postoperative nausea and vomiting)    RBBB (right bundle branch block)    Old   Stroke (HCC)    mini strokes   Type II diabetes mellitus (HCC)    Type II, [name]. patient has omnipod insulin pump with Novolin R Insulin    Past Surgical History:  Procedure Laterality Date   A/V FISTULAGRAM Left 11/07/2022   Procedure: A/V Fistulagram;  Surgeon: Leonie Douglas, MD;  [facility]: [facility];  Service: Cardiovascular;  Laterality: Left;   ABDOMINAL HYSTERECTOMY  1980's   AMPUTATION Right 02/24/2018   Procedure: RIGHT FOOT GREAT TOE AND 2ND TOE AMPUTATION;  Surgeon: Nadara Mustard, MD;  Location: [facility] OR;  Service: Orthopedics;  Laterality: Right;   AMPUTATION Right 04/30/2018   Procedure: RIGHT TRANSMETATARSAL AMPUTATION;  Surgeon: Nadara Mustard, MD;  Location: [facility] OR;  Service: Orthopedics;  Laterality: Right;   AMPUTATION Right 05/02/2022   Procedure: RIGHT BELOW KNEE AMPUTATION;  Surgeon: Nadara Mustard, MD;  Location: [facility] OR;  Service: Orthopedics;  Laterality: Right;   APPLICATION OF WOUND VAC Right 06/13/2022   Procedure: APPLICATION OF WOUND VAC;  Surgeon: Nadara Mustard, MD;  Location: [facility] OR;  Service: Orthopedics;  Laterality: Right;   APPLICATION OF WOUND VAC Left  11/14/2022   Procedure: APPLICATION OF WOUND VAC;  Surgeon: Nadara Mustard, MD;  Location: [facility] OR;  Service: Orthopedics;  Laterality: Left;   AV FISTULA PLACEMENT Left 04/02/2022   Procedure: LEFT ARM ARTERIOVENOUS (AV) FISTULA CREATION;  Surgeon: Nada Libman, MD;  Location: [facility] OR;  Service: Vascular;  Laterality: Left;  PERIPHERAL NERVE BLOCK   BASCILIC VEIN TRANSPOSITION Left 07/31/2022   Procedure: LEFT ARM SECOND STAGE BASILIC VEIN TRANSPOSITION;  Surgeon: Nada Libman, MD;  Location: [facility] OR;  Service: Vascular;  Laterality: Left;   BIOPSY  05/27/2020   Procedure: BIOPSY;  Surgeon: Lanelle Bal, DO;  Location: [facility];  Service: Endoscopy;;   CATARACT  EXTRACTION, BILATERAL Bilateral ?2013   COLONOSCOPY W/ POLYPECTOMY     COLONOSCOPY WITH PROPOFOL N/A 03/13/2019   Procedure: COLONOSCOPY WITH PROPOFOL;  Surgeon: Beverley Fiedler, MD;  Location: [facility] ENDOSCOPY;  Service: Gastroenterology;  Laterality: N/A;   CORONARY ANGIOPLASTY WITH STENT PLACEMENT  1999; 2007   "1 + 1"   ERCP N/A 02/03/2022   Procedure: ENDOSCOPIC RETROGRADE CHOLANGIOPANCREATOGRAPHY (ERCP);  Surgeon: Jeani Hawking, MD;  Location: [facility] ENDOSCOPY;  Service: Gastroenterology;  Laterality: N/A;   ESOPHAGOGASTRODUODENOSCOPY (EGD) WITH PROPOFOL N/A 03/13/2019   Procedure: ESOPHAGOGASTRODUODENOSCOPY (EGD) WITH PROPOFOL;  Surgeon: Beverley Fiedler, MD;  Location: [facility] ENDOSCOPY;  Service: Gastroenterology;  Laterality: N/A;   ESOPHAGOGASTRODUODENOSCOPY (EGD) WITH PROPOFOL N/A 05/27/2020   Procedure: ESOPHAGOGASTRODUODENOSCOPY (EGD) WITH PROPOFOL;  Surgeon: Lanelle Bal, DO;  [facility];  Service: Endoscopy;  Laterality: N/A;   ESOPHAGOGASTRODUODENOSCOPY (EGD) WITH PROPOFOL N/A 09/03/2022   Procedure: ESOPHAGOGASTRODUODENOSCOPY (EGD) WITH PROPOFOL;  Surgeon: Jeani Hawking, MD;  Location: [facility] ENDOSCOPY;  Service: Gastroenterology;  Laterality: N/A;   EYE SURGERY Bilateral    lazer   FLEXIBLE SIGMOIDOSCOPY N/A 05/23/2022   Procedure: FLEXIBLE SIGMOIDOSCOPY;  Surgeon: Jeani Hawking, MD;  Location: [facility] ENDOSCOPY;  Service: Gastroenterology;  Laterality: N/A;   FLEXIBLE SIGMOIDOSCOPY N/A 05/24/2022   Procedure: FLEXIBLE SIGMOIDOSCOPY;  Surgeon: Imogene Burn, MD;  Location: [facility] ENDOSCOPY;  Service: Gastroenterology;  Laterality: N/A;   FLEXIBLE SIGMOIDOSCOPY N/A 09/03/2022   Procedure: FLEXIBLE SIGMOIDOSCOPY;  Surgeon: Jeani Hawking, MD;  Location: [facility] ENDOSCOPY;  Service: Gastroenterology;  Laterality: N/A;   HEMOSTASIS CLIP PLACEMENT  03/13/2019   Procedure: HEMOSTASIS CLIP PLACEMENT;  Surgeon: Beverley Fiedler, MD;  Location: [facility] ENDOSCOPY;  Service: Gastroenterology;;   HEMOSTASIS CLIP  PLACEMENT  05/23/2022   Procedure: HEMOSTASIS CLIP PLACEMENT;  Surgeon: Jeani Hawking, MD;  Location: [facility] ENDOSCOPY;  Service: Gastroenterology;;   HEMOSTASIS CONTROL  05/24/2022   Procedure: HEMOSTASIS CONTROL;  Surgeon: Imogene Burn, MD;  Location: [facility] ENDOSCOPY;  Service: Gastroenterology;;   HOT HEMOSTASIS N/A 05/23/2022   Procedure: HOT HEMOSTASIS (ARGON PLASMA COAGULATION/BICAP);  Surgeon: Jeani Hawking, MD;  Location: [facility] ENDOSCOPY;  Service: Gastroenterology;  Laterality: N/A;   I & D EXTREMITY Left 05/05/2022   Procedure: IRRIGATION AND DEBRIDEMENT LEFT ARM AV FISTULA;  Surgeon: Cephus Shelling, MD;  Location: [facility] OR;  Service: Vascular;  Laterality: Left;   I & D EXTREMITY N/A 11/14/2022   Procedure: IRRIGATION AND DEBRIDEMENT OF LOWER EXTREMITY WOUND;  Surgeon: Nadara Mustard, MD;  [facility] OR;  Service: Orthopedics;  Laterality: N/A;   INSERTION OF DIALYSIS CATHETER Right 04/02/2022   Procedure: INSERTION OF TUNNELED DIALYSIS CATHETER;  Surgeon: Nada Libman, MD;  Location: [facility] OR;  Service: Vascular;  Laterality: Right;   KNEE ARTHROSCOPY Left 10/25/2006   POLYPECTOMY  03/13/2019  Procedure: POLYPECTOMY;  Surgeon: Beverley Fiedler, MD;  Location: [facility] ENDOSCOPY;  Service: Gastroenterology;;   REMOVAL OF STONES  02/03/2022   Procedure: REMOVAL OF STONES;  Surgeon: Jeani Hawking, MD;  Location: [facility] ENDOSCOPY;  Service: Gastroenterology;;   REVISON OF ARTERIOVENOUS FISTULA Left 08/20/2022   Procedure: REVISON OF LEFT ARM ARTERIOVENOUS FISTULA;  Surgeon: Nada Libman, MD;  [facility];  Service: Vascular;  Laterality: Left;   RIGHT HEART CATH N/A 07/24/2017   Procedure: RIGHT HEART CATH;  Surgeon: Dolores Patty, MD;  [facility];  Service: Cardiovascular;  Laterality: N/A;   RIGHT HEART CATHETERIZATION N/A 09/22/2013   Procedure: RIGHT HEART CATH;  Surgeon: Dolores Patty, MD;  Location: [facility] CATH LAB;  Service: Cardiovascular;  Laterality: N/A;    SHOULDER ARTHROSCOPY WITH OPEN ROTATOR CUFF REPAIR Right 03/14/2014   Procedure: RIGHT SHOULDER ARTHROSCOPY WITH BICEPS RELEASE, OPEN SUBSCAPULA REPAIR, OPEN SUPRASPINATUS REPAIR.;  Surgeon: Cammy Copa, MD;  Location: [facility] OR;  Service: Orthopedics;  Laterality: Right;   SPHINCTEROTOMY  02/03/2022   Procedure: SPHINCTEROTOMY;  Surgeon: Jeani Hawking, MD;  Location: [facility] ENDOSCOPY;  Service: Gastroenterology;;   STUMP REVISION Right 06/13/2022   Procedure: REVISION RIGHT BELOW KNEE AMPUTATION;  Surgeon: Nadara Mustard, MD;  Location: [facility] OR;  Service: Orthopedics;  Laterality: Right;   TEE WITHOUT CARDIOVERSION N/A 02/04/2022   Procedure: TRANSESOPHAGEAL ECHOCARDIOGRAM (TEE);  Surgeon: Dolores Patty, MD;  Location: [facility] ENDOSCOPY;  Service: Cardiovascular;  Laterality: N/A;   THROMBECTOMY W/ EMBOLECTOMY Left 08/20/2022   Procedure: THROMBECTOMY OF LEFT ARM ARTERIOVENOUS FISTULA;  Surgeon: Nada Libman, MD;  [facility] OR;  Service: Vascular;  Laterality: Left;   TOE AMPUTATION Right 02/24/2018   GREAT TOE AND 2ND TOE AMPUTATION   TUBAL LIGATION  1970's    Family Hx:  Family History  Problem Relation Age of Onset   Heart attack Mother 8  Denies any family history of ESRD    Social History:  reports that she quit smoking about 25 years ago. Her smoking use included cigarettes. She started smoking about 57 years ago. She has a 96 pack-year smoking history. She has never used smokeless tobacco. She reports that she does not currently use alcohol. She reports that she does not use drugs.  Allergies:  Allergies  Allergen Reactions   Cephalexin Diarrhea and Other (See Comments)   Codeine Nausea And Vomiting and Other (See Comments)    Medications: Prior to Admission medications   Medication Sig Start Date End Date Taking? Authorizing Provider  acetaminophen (TYLENOL) 325 MG tablet Take 2 tablets (650 mg total) by mouth every 6 (six) hours as needed for mild pain (or Fever >/=  101). 12/10/22   Angiulli, Mcarthur Rossetti, PA-C  albuterol (VENTOLIN HFA) 108 (90 Base) MCG/ACT inhaler Inhale 1-2 puffs into the lungs every 6 (six) hours as needed for wheezing or shortness of breath. 12/11/22   Angiulli, Mcarthur Rossetti, PA-C  amiodarone (PACERONE) 200 MG tablet Take 1 tablet (200 mg total) by mouth daily. 12/11/22   Angiulli, Mcarthur Rossetti, PA-C  apixaban (ELIQUIS) 5 MG TABS tablet Take 1 tablet (5 mg total) by mouth 2 (two) times daily. 12/11/22   Angiulli, Mcarthur Rossetti, PA-C  ascorbic acid (VITAMIN C) 500 MG tablet Take 1 tablet (500 mg total) by mouth daily. 12/11/22   Angiulli, Mcarthur Rossetti, PA-C  atorvastatin (LIPITOR) 80 MG tablet TAKE 1 TABLET BY MOUTH EVERY DAY 12/26/22   Bensimhon, Bevelyn Buckles, MD  Calcium  Carbonate Antacid (CALCIUM CARBONATE, DOSED IN MG ELEMENTAL CALCIUM,) 1250 MG/5ML SUSP Take 5 mLs (500 mg of elemental calcium total) by mouth every 6 (six) hours as needed for indigestion. 12/10/22   Angiulli, Mcarthur Rossetti, PA-C  carvedilol (COREG) 6.25 MG tablet Take 6.25 mg by mouth 2 (two) times daily with a meal. 08/11/22   [provider]  cefdinir (OMNICEF) 300 MG capsule Take 1 capsule (300 mg total) by mouth every other day. 01/17/23   Danford, Earl Lites, MD  clonazePAM (KLONOPIN) 0.5 MG disintegrating tablet Take 1 tablet (0.5 mg total) by mouth 2 (two) times daily. 01/19/23   Donita Brooks, MD  folic acid (FOLVITE) 1 MG tablet Take 1 tablet (1 mg total) by mouth daily. 01/09/23   Donita Brooks, MD  insulin aspart (NOVOLOG FLEXPEN) 100 UNIT/ML FlexPen Inject 0-15 Units into the skin See admin instructions. Inject 12 units subcutaneously 3 times daily with meals (0900, 1300 & 1700) & as needed via sliding scale insulin CBG <70=Notify NP/MD 71-149=0 units,150-199=4 units, 200-249=6 units,250- 299=10 units, 300-349=12 units, 350-399=15 units, > 400 call MD ( prime pen with 2 units prior  to set dose) Patient taking differently: Inject 0-16 Units into the skin 3 (three) times daily with  meals. 09/05/22   Leroy Sea, MD  insulin glargine (LANTUS) 100 UNIT/ML Solostar Pen Inject 12 units into the skin at bedtime (Max 40 units) 12/11/22   Angiulli, Mcarthur Rossetti, PA-C  levothyroxine (SYNTHROID) 75 MCG tablet Take 1 tablet (75 mcg total) by mouth daily before breakfast. 12/11/22   Angiulli, Mcarthur Rossetti, PA-C  methocarbamol (ROBAXIN) 750 MG tablet Take 1 tablet (750 mg total) by mouth every 6 (six) hours as needed for muscle spasms. 12/11/22   Angiulli, Mcarthur Rossetti, PA-C  metoCLOPramide (REGLAN) 5 MG tablet Take 1 tablet (5 mg total) by mouth 3 (three) times daily before meals. 01/09/23   Donita Brooks, MD  midodrine (PROAMATINE) 10 MG tablet Take 1 tablet (10 mg total) by mouth Every Tuesday,Thursday,and Saturday with dialysis. 12/11/22   Angiulli, Mcarthur Rossetti, PA-C  pantoprazole (PROTONIX) 40 MG tablet Take 1 tablet (40 mg total) by mouth 2 (two) times daily before a meal. 01/09/23 01/09/24  Donita Brooks, MD  polyethylene glycol (MIRALAX / GLYCOLAX) 17 g packet Take 17 g by mouth daily. 12/10/22   Angiulli, Mcarthur Rossetti, PA-C  ramelteon (ROZEREM) 8 MG tablet Take 1 tablet (8 mg total) by mouth at bedtime. 01/19/23   Donita Brooks, MD  senna-docusate (SENOKOT-S) 8.6-50 MG tablet Take 2 tablets by mouth at bedtime. Patient taking differently: Take 2 tablets by mouth at bedtime as needed for mild constipation. 12/10/22   Angiulli, Mcarthur Rossetti, PA-C  sevelamer carbonate (RENVELA) 800 MG tablet Take 2 tablets (1,600 mg total) by mouth 3 (three) times daily with meals. (0800, 1200 & 1700) 12/11/22   Angiulli, Mcarthur Rossetti, PA-C  venlafaxine XR (EFFEXOR-XR) 75 MG 24 hr capsule Take 1 capsule (75 mg total) by mouth daily with breakfast. Stop trintellix 01/05/23   Donita Brooks, MD  Zinc Sulfate 220 (50 Zn) MG TABS Take 1 tablet (220 mg total) by mouth daily. 12/11/22   Angiulli, Mcarthur Rossetti, PA-C   I have reviewed the patient's current and reported prior to admission medications.  Labs:     Latest Ref Rng &  Units 01/20/2023    3:02 AM 01/17/2023    4:27 AM 01/16/2023    4:04 AM  BMP  Glucose 70 -  99 mg/dL 865  784  696   BUN 8 - 23 mg/dL 60  52  28   Creatinine 0.44 - 1.00 mg/dL 2.95  2.84  1.32   Sodium 135 - 145 mmol/L 132  127  133   Potassium 3.5 - 5.1 mmol/L 4.1  3.9  3.4   Chloride 98 - 111 mmol/L 92  91  95   CO2 22 - 32 mmol/L 21  21  28    Calcium 8.9 - 10.3 mg/dL 9.8  8.3  8.7      ROS:  Pertinent items noted in HPI and remainder of comprehensive ROS otherwise negative.  Physical Exam: Vitals:   01/20/23 0915 01/20/23 1018  BP: (!) 175/92 (!) 170/83  Pulse: 87 87  Resp: 16 17  Temp:  98.4 F (36.9 C)  SpO2: 100% 100%     General:  adult female in bed in NAD   HEENT: NCAT  Eyes: EOMI sclera anicteric Neck: Supple trachea midline  Heart: S1S2 no rub Lungs: on 2.5 liters oxygen  Abdomen: tenderness RUQ; soft/distended abdomen - obese habitus  Extremities:  right BKA; 1+ edema residual limb and left leg Skin: no rash on extremities exposed Neuro: alert and oriented x 3 provides hx and follows commands  Access RIJ tunn catheter   Outpatient HD unit:  TTS [facility]  4h  400/800   2/2 bath   RIJ TDC   Heparin 4000 + 2000 midrun  EDW was just lowered to 79 kg  - rocaltrol 0.75 mcg po three times per week - mircera 120 mcg  IV q 4 wks  Just discharged 01/17/23  Assessment/Plan:  # Intractable Nausea/vomiting  - per primary team   # ESRD - on HD per TTS schedule   # Anasarca  - noted prior admission  - optimize volume status with HD   # HTN  - optimize volume with HD   # Hyponatremia  - Improved   # Anemia of CKD  - No current ESA needs - Hb 11.2   # Metabolic bone disease  - Continue outpatient regimen when tolerating PO.  Appears to be on renvela  - continue calcitriol 0.75 mcg three times a week  Thank you for the consult.  Please do not hesitate to contact me with any questions regarding our patient   Estanislado Emms 01/20/2023, 1:35 PM

## 2023-01-20 NOTE — Progress Notes (Signed)
NEW ADMISSION NOTE New Admission Note:   Arrival Method: Patient arrived from ED on a stretcher accompanied by the staff. Mental Orientation: alert and oriented x 4. Telemetry: 32M-04, NSR Assessment: Completed Skin: R BKA, Left leg with compression wrap noted from foot to below knee.  Pt informed us not to remove the drsg per Dr. Lajoyce Corners. IV: R FA, SL Pain: Denies any pain. Tubes: NOne. Safety Measures: Safety Fall Prevention Plan has been given, discussed and signed Admission: in progress [facility] Orientation: Patient has been orientated to the room, unit and staff.  Family: NOne  Orders have been reviewed and implemented. Will continue to monitor the patient. Call light has been placed within reach and bed alarm has been activated.   Arvilla Meres, RN

## 2023-01-20 NOTE — ED Provider Notes (Signed)
  Physical Exam  BP (!) 176/84   Pulse 83   Temp (!) 97.3 F (36.3 C) (Axillary)   Resp 12   Wt 80 kg   SpO2 100%   BMI 29.35 kg/m     Procedures  Procedures  ED Course / MDM   Clinical Course as of 01/20/23 0709  Tue Jan 20, 2023  0345 Patient with plethora of medical conditions presents with nausea vomiting abdominal pain.  Patient was just recently in the hospital for uncontrolled hyperglycemia.  Now having recurrent abdominal pain and nausea vomiting, but she reports she has had this pain previously.  Patient had extensive workup including CT imaging, ultrasounds and gastroenterology evaluation.  Recently found to have proctocolitis [DW]  0346 Patient requesting medicines for anxiety [DW]  0429 Glucose(!): 309 Hyperglycemia [DW]  0429 Creatinine(!): 5.12 Chronic renal failure [DW]  0449 Patient reports she still continues to feel terrible, but is declining any new medicines.  On repeat exam most of her pain is in the right upper quadrant.  Otherwise her abdomen is soft and nontender.  Patient has known cholelithiasis, will obtain ultrasound to evaluate for cholecystitis [DW]  0607 Patient reports she still feels nauseous and having abdominal pain.  Ultrasound reveals cholelithiasis without complicating features.  Patient reports she cannot go home.  She is now requesting medicines for nausea.  Will proceed with CT imaging due to abdominal pain of unclear etiology [DW]  0703 At signout to Dr. Karene Fry, f/u on CT imaging and likely admit for intractable nausea and dialysis [DW]    Clinical Course User Index [DW] Zadie Rhine, MD   Medical Decision Making Amount and/or Complexity of Data Reviewed Labs: ordered. Decision-making details documented in ED Course. Radiology: ordered. ECG/medicine tests: ordered.  Risk Prescription drug management. Decision regarding hospitalization.   80F, presenting with n/v, CT pending, will need likely admission for intractable pain,  nausea, inability to tolerate PO. Needs dialysis today, will need nephro consult inpatient.   CT Abdomen Pelvis: IMPRESSION:  1. No specific cause for right lower quadrant pain. No appendicitis.  2. Pulmonary edema and small pleural effusions seen at the lung  bases  3. No progression of small splenic infarcts since prior.  4. Cholelithiasis, colonic diverticulosis, and extensive  atheromatous calcification.   On my evaluation, the patient stated that she was completely unable to tolerate oral intake. She continued to endorse generalized abdominal discomfort. On my exam she is tender in both the RUQ and LUQ. She was administered Phenergan due to persistent nausea. She is due to dialysis today, currently not hyperkalemic. CT Abd Pel revealed evidence of pulmonary edema. RUQ [name] with cholelithiasis without cholecystitis.  LFTs and T. bili are normal.  She is stable saturating 100% on 3L O2 which she states is her baseline.   Nephrology consulted due to need for non-emergent dialysis today. Hospitalist medicine consulted due to persistent nausea and vomiting and inability to tolerate oral intake. Suspect gastroparesis in the setting of her diabetes vs symptomatic cholelithiasis as the etiology of her presentation.   Nephrology: Spoke with Dr. Malen Gauze who will add the patient to the list for dialysis today.  General Surgery: Spoke with Carlena Bjornstad, PA of general surgery. She agreed likely not immediate need for surgical intervention. Recommended non-emergent GI consult and if enough suspicion for biliary dyskinesia, could get HIDA scan inpatient.  Hospitalist medicine accepted the patient in admission.      Ernie Avena, MD 01/20/23 [phone]

## 2023-01-20 NOTE — ED Notes (Signed)
ED TO INPATIENT HANDOFF REPORT  ED Nurse Name and Phone #: Dot Lanes, paramedic [phone]  S Name/Age/Gender Susan [name] 72 y.o. female Room/Bed: 030C/030C  Code Status   Code Status: Prior  Home/SNF/Other Home Patient oriented to: self, place, time, and situation Is this baseline? Yes   Triage Complete: Triage complete  Chief Complaint Intractable nausea and vomiting [R11.2]  Triage Note Patient recently admitted for n/v and reports that she started having n/v again today.  Patient believes she's having a reaction to the insulin she's on.   Allergies Allergies  Allergen Reactions   Cephalexin Diarrhea and Other (See Comments)   Codeine Nausea And Vomiting and Other (See Comments)    Level of Care/Admitting Diagnosis ED Disposition     ED Disposition  Admit   Condition  --   Comment  Hospital Area: [facility] [[id number]]  Level of Care: Telemetry Medical [[id number]]  May admit patient to [name] or [facility] if equivalent level of care is available:: No  Covid Evaluation: Asymptomatic - no recent exposure (last 10 days) testing not required  Diagnosis: Intractable nausea and vomiting [720114]  Admitting Physician: Lurline Del [[id number]]  Attending Physician: Lurline Del [[id number]]  Certification:: I certify this patient will need inpatient services for at least 2 midnights  Expected Medical Readiness: 01/23/2023          B Medical/Surgery History Past Medical History:  Diagnosis Date   Anemia    hx   Anxiety    Arthritis    "generalized" (03/15/2014)   CAD (coronary artery disease)    MI in 2000 - MI  2007 - treated bare metal stent (no nuclear since then as 9/11)   Carotid artery disease (HCC)    Chronic diastolic heart failure (HCC)    a) ECHO (08/2013) EF 55-60% and RV function nl b) RHC (08/2013) RA 4, RV 30/5/7, PA 25/10 (16), PCWP 7, Fick CO/CI 6.3/2.7, PVR 1.5 WU, PA 61 and 66%   Daily headache    "~ every other  day; since I fell in June" (03/15/2014)   Depression    Diabetic retinopathy (HCC)    Dyslipidemia    ESRD (end stage renal disease) (HCC)    Dialysis on Tues Thurs Sat   Exertional shortness of breath    History of kidney stones    HTN (hypertension)    Hypothyroidism    Obesity    Osteoarthritis    PAF (paroxysmal atrial fibrillation) (HCC)    Peripheral neuropathy    bilateral feet/hands   PONV (postoperative nausea and vomiting)    RBBB (right bundle branch block)    Old   Stroke (HCC)    mini strokes   Type II diabetes mellitus (HCC)    Type II, [name]. patient has omnipod insulin pump with Novolin R Insulin   Past Surgical History:  Procedure Laterality Date   A/V FISTULAGRAM Left 11/07/2022   Procedure: A/V Fistulagram;  Surgeon: Leonie Douglas, MD;  [facility] INV[facility] CV LAB;  Service: Cardiovascular;  Laterality: Left;   ABDOMINAL HYSTERECTOMY  1980's   AMPUTATION Right 02/24/2018   Procedure: RIGHT FOOT GREAT TOE AND 2ND TOE AMPUTATION;  Surgeon: Nadara Mustard, MD;  Location: [facility] OR;  Service: Orthopedics;  Laterality: Right;   AMPUTATION Right 04/30/2018   Procedure: RIGHT TRANSMETATARSAL AMPUTATION;  Surgeon: Nadara Mustard, MD;  [facility]: [facility] OR;  Service: Orthopedics;  Laterality: Right;   AMPUTATION Right 05/02/2022  Procedure: RIGHT BELOW KNEE AMPUTATION;  Surgeon: Nadara Mustard, MD;  Location: [facility] OR;  Service: Orthopedics;  Laterality: Right;   APPLICATION OF WOUND VAC Right 06/13/2022   Procedure: APPLICATION OF WOUND VAC;  Surgeon: Nadara Mustard, MD;  Location: [facility] OR;  Service: Orthopedics;  Laterality: Right;   APPLICATION OF WOUND VAC Left 11/14/2022   Procedure: APPLICATION OF WOUND VAC;  Surgeon: Nadara Mustard, MD;  Location: [facility] OR;  Service: Orthopedics;  Laterality: Left;   AV FISTULA PLACEMENT Left 04/02/2022   Procedure: LEFT ARM ARTERIOVENOUS (AV) FISTULA CREATION;  Surgeon: Nada Libman, MD;  [facility]: [facility] OR;  Service:  Vascular;  Laterality: Left;  PERIPHERAL NERVE BLOCK   BASCILIC VEIN TRANSPOSITION Left 07/31/2022   Procedure: LEFT ARM SECOND STAGE BASILIC VEIN TRANSPOSITION;  Surgeon: Nada Libman, MD;  [facility]: [facility] OR;  Service: Vascular;  Laterality: Left;   BIOPSY  05/27/2020   Procedure: BIOPSY;  Surgeon: Lanelle Bal, DO;  [facility]: [facility] ENDO SUITE;  Service: Endoscopy;;   CATARACT EXTRACTION, BILATERAL Bilateral ?2013   COLONOSCOPY W/ POLYPECTOMY     COLONOSCOPY WITH PROPOFOL N/A 03/13/2019   Procedure: COLONOSCOPY WITH PROPOFOL;  Surgeon: Beverley Fiedler, MD;  Location: [facility] ENDOSCOPY;  Service: Gastroenterology;  Laterality: N/A;   CORONARY ANGIOPLASTY WITH STENT PLACEMENT  1999; 2007   "1 + 1"   ERCP N/A 02/03/2022   Procedure: ENDOSCOPIC RETROGRADE CHOLANGIOPANCREATOGRAPHY (ERCP);  Surgeon: Jeani Hawking, MD;  Location: [facility] ENDOSCOPY;  Service: Gastroenterology;  Laterality: N/A;   ESOPHAGOGASTRODUODENOSCOPY (EGD) WITH PROPOFOL N/A 03/13/2019   Procedure: ESOPHAGOGASTRODUODENOSCOPY (EGD) WITH PROPOFOL;  Surgeon: Beverley Fiedler, MD;  Location: [facility] ENDOSCOPY;  Service: Gastroenterology;  Laterality: N/A;   ESOPHAGOGASTRODUODENOSCOPY (EGD) WITH PROPOFOL N/A 05/27/2020   Procedure: ESOPHAGOGASTRODUODENOSCOPY (EGD) WITH PROPOFOL;  Surgeon: Lanelle Bal, DO;  [facility];  Service: Endoscopy;  Laterality: N/A;   ESOPHAGOGASTRODUODENOSCOPY (EGD) WITH PROPOFOL N/A 09/03/2022   Procedure: ESOPHAGOGASTRODUODENOSCOPY (EGD) WITH PROPOFOL;  Surgeon: Jeani Hawking, MD;  Location: [facility] ENDOSCOPY;  Service: Gastroenterology;  Laterality: N/A;   EYE SURGERY Bilateral    lazer   FLEXIBLE SIGMOIDOSCOPY N/A 05/23/2022   Procedure: FLEXIBLE SIGMOIDOSCOPY;  Surgeon: Jeani Hawking, MD;  Location: [facility] ENDOSCOPY;  Service: Gastroenterology;  Laterality: N/A;   FLEXIBLE SIGMOIDOSCOPY N/A 05/24/2022   Procedure: FLEXIBLE SIGMOIDOSCOPY;  Surgeon: Imogene Burn, MD;  Location: [facility] ENDOSCOPY;  Service:  Gastroenterology;  Laterality: N/A;   FLEXIBLE SIGMOIDOSCOPY N/A 09/03/2022   Procedure: FLEXIBLE SIGMOIDOSCOPY;  Surgeon: Jeani Hawking, MD;  Location: [facility] ENDOSCOPY;  Service: Gastroenterology;  Laterality: N/A;   HEMOSTASIS CLIP PLACEMENT  03/13/2019   Procedure: HEMOSTASIS CLIP PLACEMENT;  Surgeon: Beverley Fiedler, MD;  Location: [facility] ENDOSCOPY;  Service: Gastroenterology;;   HEMOSTASIS CLIP PLACEMENT  05/23/2022   Procedure: HEMOSTASIS CLIP PLACEMENT;  Surgeon: Jeani Hawking, MD;  Location: [facility] ENDOSCOPY;  Service: Gastroenterology;;   HEMOSTASIS CONTROL  05/24/2022   Procedure: HEMOSTASIS CONTROL;  Surgeon: Imogene Burn, MD;  Location: [facility] ENDOSCOPY;  Service: Gastroenterology;;   HOT HEMOSTASIS N/A 05/23/2022   Procedure: HOT HEMOSTASIS (ARGON PLASMA COAGULATION/BICAP);  Surgeon: Jeani Hawking, MD;  Location: [facility] ENDOSCOPY;  Service: Gastroenterology;  Laterality: N/A;   I & D EXTREMITY Left 05/05/2022   Procedure: IRRIGATION AND DEBRIDEMENT LEFT ARM AV FISTULA;  Surgeon: Cephus Shelling, MD;  [facility] OR;  Service: Vascular;  Laterality: Left;   I & D EXTREMITY N/A 11/14/2022   Procedure: IRRIGATION AND DEBRIDEMENT OF LOWER EXTREMITY WOUND;  Surgeon: Nadara Mustard, MD;  Location: [facility] OR;  Service: Orthopedics;  Laterality: N/A;   INSERTION OF DIALYSIS CATHETER Right 04/02/2022   Procedure: INSERTION OF TUNNELED DIALYSIS CATHETER;  Surgeon: Nada Libman, MD;  Location: [name] OR;  Service: Vascular;  Laterality: Right;   KNEE ARTHROSCOPY Left 10/25/2006   POLYPECTOMY  03/13/2019   Procedure: POLYPECTOMY;  Surgeon: Beverley Fiedler, MD;  [facility] ENDOS[facility];  Service: Gastroenterology;;   REMOVAL OF STONES  02/03/2022   Procedure: REMOVAL OF STONES;  Surgeon: Jeani Hawking, MD;  Location: [facility] ENDOSCOPY;  Service: Gastroenterology;;   REVISON OF ARTERIOVENOUS FISTULA Left 08/20/2022   Procedure: REVISON OF LEFT ARM ARTERIOVENOUS FISTULA;  Surgeon: Nada Libman, MD;  [facility];   Service: Vascular;  Laterality: Left;   RIGHT HEART CATH N/A 07/24/2017   Procedure: RIGHT HEART CATH;  Surgeon: Dolores Patty, MD;  [facility];  Service: Cardiovascular;  Laterality: N/A;   RIGHT HEART CATHETERIZATION N/A 09/22/2013   Procedure: RIGHT HEART CATH;  Surgeon: Dolores Patty, MD;  Location: [facility] CATH LAB;  Service: Cardiovascular;  Laterality: N/A;   SHOULDER ARTHROSCOPY WITH OPEN ROTATOR CUFF REPAIR Right 03/14/2014   Procedure: RIGHT SHOULDER ARTHROSCOPY WITH BICEPS RELEASE, OPEN SUBSCAPULA REPAIR, OPEN SUPRASPINATUS REPAIR.;  Surgeon: Cammy Copa, MD;  Location: [facility] OR;  Service: Orthopedics;  Laterality: Right;   SPHINCTEROTOMY  02/03/2022   Procedure: SPHINCTEROTOMY;  Surgeon: Jeani Hawking, MD;  Location: [facility] ENDOSCOPY;  Service: Gastroenterology;;   STUMP REVISION Right 06/13/2022   Procedure: REVISION RIGHT BELOW KNEE AMPUTATION;  Surgeon: Nadara Mustard, MD;  Location: [facility] OR;  Service: Orthopedics;  Laterality: Right;   TEE WITHOUT CARDIOVERSION N/A 02/04/2022   Procedure: TRANSESOPHAGEAL ECHOCARDIOGRAM (TEE);  Surgeon: Dolores Patty, MD;  Location: [facility] ENDOSCOPY;  Service: Cardiovascular;  Laterality: N/A;   THROMBECTOMY W/ EMBOLECTOMY Left 08/20/2022   Procedure: THROMBECTOMY OF LEFT ARM ARTERIOVENOUS FISTULA;  Surgeon: Nada Libman, MD;  [facility] OR;  Service: Vascular;  Laterality: Left;   TOE AMPUTATION Right 02/24/2018   GREAT TOE AND 2ND TOE AMPUTATION   TUBAL LIGATION  1970's     A IV Location/Drains/Wounds Patient Lines/Drains/Airways Status     Active Line/Drains/Airways     Name Placement date Placement time Site Days   Peripheral IV 01/20/23 20 G 1.88" Right Forearm 01/20/23  0350  Forearm  less than 1   Fistula / Graft Left Upper arm Arteriovenous fistula 07/31/22  0942  Upper arm  173   Hemodialysis Catheter Right Subclavian 06/06/22  --  Subclavian  228   Pressure Injury 08/27/22 Coccyx Mid Stage 2 -  Partial  thickness loss of dermis presenting as a shallow open injury with a red, pink wound bed without slough. 64mmx5mmx0 red center unattached edges 08/27/22  1000  -- 146   Wound / Incision (Open or Dehisced) 08/27/22 Irritant Dermatitis (Moisture Associated Skin Damage) Right 08/27/22  1000  --  146   Wound / Incision (Open or Dehisced) 11/10/22 Diabetic ulcer Pretibial Left;Lateral;Lower 11/10/22  2300  Pretibial  71            Intake/Output Last 24 hours No intake or output data in the 24 hours ending 01/20/23 5621  Labs/Imaging Results for orders placed or performed during the hospital encounter of 01/20/23 (from the past 48 hour(s))  Lipase, blood     Status: None   Collection Time: 01/20/23  3:02 AM  Result Value Ref Range  Lipase 31 11 - 51 U/L    Comment: Performed at [facility], [address], [place], [place] [address]  Comprehensive metabolic panel     Status: Abnormal   Collection Time: 01/20/23  3:02 AM  Result Value Ref Range   Sodium 132 (L) 135 - 145 mmol/L   Potassium 4.1 3.5 - 5.1 mmol/L   Chloride 92 (L) 98 - 111 mmol/L   CO2 21 (L) 22 - 32 mmol/L   Glucose, Bld 309 (H) 70 - 99 mg/dL    Comment: Glucose reference range applies only to samples taken after fasting for at least 8 hours.   BUN 60 (H) 8 - 23 mg/dL   Creatinine, Ser 2.13 (H) 0.44 - 1.00 mg/dL   Calcium 9.8 8.9 - 08.6 mg/dL   Total Protein 7.4 6.5 - 8.1 g/dL   Albumin 3.2 (L) 3.5 - 5.0 g/dL   AST 15 15 - 41 U/L   ALT 18 0 - 44 U/L   Alkaline Phosphatase 243 (H) 38 - 126 U/L   Total Bilirubin 0.5 0.3 - 1.2 mg/dL   GFR, Estimated 8 (L) >60 mL/min    Comment: (NOTE) Calculated using the CKD-EPI Creatinine Equation (2021)    Anion gap 19 (H) 5 - 15    Comment: Performed at [facility], [address], [place], [place] [address]  CBC     Status: Abnormal   Collection Time: 01/20/23  3:02 AM  Result Value Ref Range   WBC 10.5 4.0 - 10.5 K/uL   RBC 5.04 3.87 - 5.11 MIL/uL   Hemoglobin  11.2 (L) 12.0 - 15.0 g/dL   HCT 96.2 95.2 - 84.1 %   MCV 74.2 (L) 80.0 - 100.0 fL   MCH 22.2 (L) 26.0 - 34.0 pg   MCHC 29.9 (L) 30.0 - 36.0 g/dL   RDW 32.4 (H) 40.1 - 02.7 %   Platelets 338 150 - 400 K/uL   nRBC 0.0 0.0 - 0.2 %    Comment: Performed at [facility], [address], [place], [place] [address]  CBG monitoring, ED     Status: Abnormal   Collection Time: 01/20/23  3:54 AM  Result Value Ref Range   Glucose-Capillary 280 (H) 70 - 99 mg/dL    Comment: Glucose reference range applies only to samples taken after fasting for at least 8 hours.   *Note: Due to a large number of results and/or encounters for the requested time period, some results have not been displayed. A complete set of results can be found in Results Review.   CT ABDOMEN PELVIS W CONTRAST  Result Date: 01/20/2023 CLINICAL DATA:  Right lower quadrant abdominal pain EXAM: CT ABDOMEN AND PELVIS WITH CONTRAST TECHNIQUE: Multidetector CT imaging of the abdomen and pelvis was performed using the standard protocol following bolus administration of intravenous contrast. RADIATION DOSE REDUCTION: This exam was performed according to the departmental dose-optimization program which includes automated exposure control, adjustment of the mA and/or kV according to patient size and/or use of iterative reconstruction technique. CONTRAST:  75mL OMNIPAQUE IOHEXOL 350 MG/ML SOLN COMPARISON:  01/15/2023 FINDINGS: Lower chest: Interstitial lobular septal thickening and small pleural effusions at the lung bases. Coronary atherosclerosis. Hepatobiliary: Lobulated appearance of the inferior lobe right liver but no generalized surface lobulation or other typical morphologic changes of cirrhosis.Layering gallstones. No evidence of biliary inflammation or ductal dilatation. Pancreas: Unremarkable. Spleen: Hypoenhancing areas in the anterior spleen are stable and most consistent with  infarct. Scarring in the subcapsular high left spleen  Adrenals/Urinary Tract: Negative adrenals. No hydronephrosis or stone. Renal cortical lobulation likely from scarring, especially on the right. Unremarkable bladder. Stomach/Bowel: No evidence of bowel obstruction or inflammation. No convincing bowel wall thickening aside from areas of luminal collapse. No appendicitis. Numerous distal colonic diverticula. Vascular/Lymphatic: No acute vascular abnormality. Extensive atheromatous calcification of the aorta and branch vessels. No mass or adenopathy. Reproductive:Hysterectomy Other: No ascites or pneumoperitoneum. Musculoskeletal: No acute abnormalities. IMPRESSION: 1. No specific cause for right lower quadrant pain. No appendicitis. 2. Pulmonary edema and small pleural effusions seen at the lung bases 3. No progression of small splenic infarcts since prior. 4. Cholelithiasis, colonic diverticulosis, and extensive atheromatous calcification. Electronically Signed   By: Tiburcio Pea M.D.   On: 01/20/2023 07:16   US Abdomen Limited RUQ (LIVER/GB)  Result Date: 01/20/2023 CLINICAL DATA:  Right upper quadrant abdominal pain EXAM: ULTRASOUND ABDOMEN LIMITED RIGHT UPPER QUADRANT COMPARISON:  01/15/2023 FINDINGS: Gallbladder: No gallbladder wall thickening, sludge or pericholecystic fluid. Gallstones are identified which measure up to 1.5 cm. Negative sonographic Murphy's sign. Common bile duct: Diameter: 5.1 mm Liver: No focal lesion identified. Within normal limits in parenchymal echogenicity. Portal vein is patent on color Doppler imaging with normal direction of blood flow towards the liver. Other: None. IMPRESSION: Cholelithiasis. No secondary signs of acute cholecystitis. Electronically Signed   By: Signa Kell M.D.   On: 01/20/2023 05:46   DG Chest Portable 1 View  Result Date: 01/20/2023 CLINICAL DATA:  Shortness of breath.  Nausea and vomiting. EXAM: PORTABLE CHEST 1 VIEW COMPARISON:  01/15/2023. FINDINGS: Heart is enlarged and the mediastinal contour  is within normal limits. There is atherosclerotic calcification of the aorta. The pulmonary vasculature is distended. Interstitial prominence is present bilaterally. There are small bilateral pleural effusions. No pneumothorax is seen. A right internal jugular central venous catheter is stable. No acute osseous abnormality. IMPRESSION: 1. Cardiomegaly with pulmonary vascular congestion. 2. Interstitial prominence bilaterally, likely edema, not significantly changed from the prior exam. 3. Small bilateral pleural effusions. Electronically Signed   By: Thornell Sartorius M.D.   On: 01/20/2023 04:18    Pending Labs Unresulted Labs (From admission, onward)     Start     Ordered   01/20/23 0853  Hepatitis B surface antigen  (New Admission Hemo Labs (Hepatitis B))  Once,   URGENT        01/20/23 0854   01/20/23 0853  Hepatitis B surface antibody,quantitative  (New Admission Hemo Labs (Hepatitis B))  Once,   URGENT        01/20/23 0854            Vitals/Pain Today's Vitals   01/20/23 0630 01/20/23 0645 01/20/23 0719 01/20/23 0915  BP: (!) 170/87 (!) 176/84 (!) 164/80 (!) 175/92  Pulse: 81 83 89 87  Resp:  12 18 16   Temp:   (!) 97.5 F (36.4 C)   TempSrc:   Axillary   SpO2: 100% 100% 97% 100%  Weight:      PainSc:   6      Isolation Precautions No active isolations  Medications Medications  Chlorhexidine Gluconate Cloth 2 % PADS 6 each (has no administration in time range)  LORazepam (ATIVAN) injection 0.5 mg (0.5 mg Intravenous Given 01/20/23 0409)  ondansetron (ZOFRAN) injection 4 mg (4 mg Intravenous Given 01/20/23 0615)  iohexol (OMNIPAQUE) 350 MG/ML injection 75 mL (75 mLs Intravenous Contrast Given 01/20/23 0706)  promethazine (PHENERGAN) 12.5 mg in  sodium chloride 0.9 % 50 mL IVPB (0 mg Intravenous Stopped 01/20/23 0827)    Mobility manual wheelchair     Focused Assessments     R Recommendations: See Admitting Provider Note  Report given to:   Additional Notes:

## 2023-01-20 NOTE — ED Notes (Signed)
Patient transported to CT 

## 2023-01-20 NOTE — Progress Notes (Signed)
Received patient in bed to unit.  Alert and oriented.  Informed consent signed and in chart.   TX duration:  Patient tolerated well.  Transported back to the room  Alert, without acute distress.  Hand-off given to patient's nurse.   Access used: R [facility] Access issues:NA  Total UF removed: 3L   01/20/23 1745  Pain Assessment  Pain Scale 0-10  Pain Score 0  Hemodialysis Catheter Right Subclavian  Placement Date: 06/06/22   Placed prior to admission: Yes  Orientation: Right  Access Location: Subclavian  Site Condition No complications  Blue Lumen Status Flushed;Dead end cap in place;Heparin locked  Red Lumen Status Flushed;Dead end cap in place;Heparin locked  Purple Lumen Status N/A  Catheter fill solution Heparin 1000 units/ml  Catheter fill volume (Arterial) 1.9 cc  Catheter fill volume (Venous) 1.9  Dressing Type Transparent  Dressing Status Clean, Dry, Intact  Drainage Description None  Dressing Change Due 01/22/23  Post treatment catheter status Capped and Clamped  Neurological  Level of Consciousness Alert  Orientation Level Oriented X4  Respiratory  Respiratory Pattern Regular;Unlabored  Chest Assessment Chest expansion symmetrical  Bilateral Breath Sounds Clear;Diminished  R Upper  Breath Sounds Clear  L Upper Breath Sounds Clear  R Lower Breath Sounds Diminished  L Lower Breath Sounds Diminished  Cough None  Cardiac  Pulse Regular  Heart Sounds S1, S2  Jugular Venous Distention (JVD) No  ECG Monitor Yes  Cardiac Rhythm NSR  Antiarrhythmic device No  Vascular  R Radial Pulse +2  L Radial Pulse +2  GU Assessment  Genitourinary (WDL) X  Genitourinary Symptoms Oliguria  Psychosocial  Psychosocial (WDL) WDL  Needs Expressed Denies      Lerry Liner LPN [facility]

## 2023-01-20 NOTE — Telephone Encounter (Signed)
SW pt's husband. He stated pt is admitted to [facility] right now. They will call once she is discharged and we can set up a follow up apt here in office with Dr. Lajoyce Corners.

## 2023-01-21 ENCOUNTER — Inpatient Hospital Stay: Payer: PPO | Admitting: Internal Medicine

## 2023-01-21 ENCOUNTER — Encounter: Payer: PPO | Admitting: Family

## 2023-01-21 DIAGNOSIS — R112 Nausea with vomiting, unspecified: Secondary | ICD-10-CM | POA: Diagnosis not present

## 2023-01-21 LAB — COMPREHENSIVE METABOLIC PANEL
ALT: 14 U/L (ref 0–44)
AST: 13 U/L — ABNORMAL LOW (ref 15–41)
Albumin: 2.5 g/dL — ABNORMAL LOW (ref 3.5–5.0)
Alkaline Phosphatase: 177 U/L — ABNORMAL HIGH (ref 38–126)
Anion gap: 9 (ref 5–15)
BUN: 28 mg/dL — ABNORMAL HIGH (ref 8–23)
CO2: 27 mmol/L (ref 22–32)
Calcium: 8.5 mg/dL — ABNORMAL LOW (ref 8.9–10.3)
Chloride: 96 mmol/L — ABNORMAL LOW (ref 98–111)
Creatinine, Ser: 3.4 mg/dL — ABNORMAL HIGH (ref 0.44–1.00)
GFR, Estimated: 14 mL/min — ABNORMAL LOW (ref >60)
Glucose, Bld: 58 mg/dL — ABNORMAL LOW (ref 70–99)
Potassium: 3.3 mmol/L — ABNORMAL LOW (ref 3.5–5.1)
Sodium: 132 mmol/L — ABNORMAL LOW (ref 135–145)
Total Bilirubin: 0.4 mg/dL (ref 0.3–1.2)
Total Protein: 5.9 g/dL — ABNORMAL LOW (ref 6.5–8.1)

## 2023-01-21 LAB — CBC
HCT: 34.6 % — ABNORMAL LOW (ref 36.0–46.0)
Hemoglobin: 10.3 g/dL — ABNORMAL LOW (ref 12.0–15.0)
MCH: 22.2 pg — ABNORMAL LOW (ref 26.0–34.0)
MCHC: 29.8 g/dL — ABNORMAL LOW (ref 30.0–36.0)
MCV: 74.4 fL — ABNORMAL LOW (ref 80.0–100.0)
Platelets: 285 10*3/uL (ref 150–400)
RBC: 4.65 MIL/uL (ref 3.87–5.11)
RDW: 18.2 % — ABNORMAL HIGH (ref 11.5–15.5)
WBC: 7.3 10*3/uL (ref 4.0–10.5)
nRBC: 0 % (ref 0.0–0.2)

## 2023-01-21 LAB — GLUCOSE, CAPILLARY
Glucose-Capillary: 82 mg/dL (ref 70–99)
Glucose-Capillary: 200 mg/dL — ABNORMAL HIGH (ref 70–99)
Glucose-Capillary: 203 mg/dL — ABNORMAL HIGH (ref 70–99)
Glucose-Capillary: 217 mg/dL — ABNORMAL HIGH (ref 70–99)

## 2023-01-21 LAB — HEPATITIS B SURFACE ANTIBODY, QUANTITATIVE: Hep B S AB Quant (Post): 2491 m[IU]/mL

## 2023-01-21 MED ORDER — PROCHLORPERAZINE EDISYLATE 10 MG/2ML IJ SOLN: 5.0000 mg | Freq: Four times a day (QID) | INTRAMUSCULAR | Status: DC | PRN

## 2023-01-21 MED ORDER — INSULIN GLARGINE-YFGN 100 UNIT/ML ~~LOC~~ SOLN
6.0000 [IU] | Freq: Every day | SUBCUTANEOUS | Status: DC
  Administered 2023-01-22 – 2023-01-23 (×3): 6 [IU] via SUBCUTANEOUS
  Filled 2023-01-21 (×4): qty 0.06

## 2023-01-21 MED ORDER — SODIUM CHLORIDE 0.9 % IV SOLN
12.5000 mg | Freq: Once | INTRAVENOUS | Status: AC
  Administered 2023-01-21: 12.5 mg via INTRAVENOUS
  Filled 2023-01-21: qty 12.5

## 2023-01-21 MED ORDER — SODIUM CHLORIDE 0.9 % IV SOLN: INTRAVENOUS | Status: DC

## 2023-01-21 MED ORDER — CHLORHEXIDINE GLUCONATE CLOTH 2 % EX PADS
6.0000 | MEDICATED_PAD | Freq: Every day | CUTANEOUS | Status: DC
  Administered 2023-01-22 – 2023-01-24 (×4): 6 via TOPICAL

## 2023-01-21 MED ORDER — PROCHLORPERAZINE EDISYLATE 10 MG/2ML IJ SOLN
5.0000 mg | Freq: Four times a day (QID) | INTRAMUSCULAR | Status: AC | PRN
  Administered 2023-01-21 – 2023-01-22 (×4): 5 mg via INTRAVENOUS
  Filled 2023-01-21 (×4): qty 2

## 2023-01-21 NOTE — Progress Notes (Signed)
[facility] Progress Note  Name: Susan [name] MRN: [id number] DOB: [birth year]  Chief Complaint:  N/v  Subjective:  She had HD on 8/27 with 3 kg UF. Last outpatient HD charted as 8/20.  However she states she went Sat.  She feels poorly today.  States that she normally wears oxygen at night at home and when laying down - doesn't wear at rest when she's up in her wheelchair.  She states that she doesn't think she could take another HD treatment today - she declines extra treatment  Review of systems:  Nausea and vomiting this morning  She reports shortness of breath; denies chest pain  --------------  Background on consult:   Susan [name] is a 72 y.o. female with a history of ESRD on hemodialysis Tuesday Thursday Saturday, coronary artery disease, chronic diastolic CHF, hypertension, paroxysmal atrial fibrillation, and type 2 diabetes mellitus who presented to the hospital with uncontrolled nausea and vomiting.  The evaluating provider in the ER is concerned about gastroparesis or possible cholecystitis.  She had gallstones on imaging and was tender right upper quadrant.  Nephrology is consulted for assistance with management of hemodialysis.  Note that she had a recent admission on 8/22 - 8/24 for proctitis and uncontrolled diabetes.  At that time her dry weight was lowered to 79 kg from 83 kg.  She states that she went to HD outpatient on Saturday - cannot open on two browsers.  She states blood sugars are 200-300 but states that this is better than the "700's before".      Intake/Output Summary (Last 24 hours) at 01/21/2023 0820 Last data filed at 01/20/2023 1749 Gross per 24 hour  Intake --  Output 3000 ml  Net -3000 ml    Vitals:  Vitals:   01/20/23 1833 01/20/23 2107 01/21/23 0504 01/21/23 0807  BP: 134/72 (!) 116/54 (!) 121/50 (!) 159/73  Pulse: 76 78 65 81  Resp: 16 13 16 16   Temp:  97.6 F (36.4 C) (!) 97.3 F (36.3 C) 97.6 F (36.4 C)  TempSrc:       SpO2: 100% 100% 100% 100%  Weight:         Physical Exam:  General:  adult female in bed in NAD, with nausea HEENT: NCAT  Eyes: EOMI sclera anicteric Neck: Supple trachea midline  Heart: S1S2 no rub Lungs: on 3 liters oxygen  Abdomen: tenderness RLQ; soft/distended abdomen - obese habitus  Extremities:  right BKA; 1+ edema left leg Neuro: alert and oriented x 3 provides hx and follows commands  Access RIJ tunn catheter   Medications reviewed   Labs:     Latest Ref Rng & Units 01/21/2023    4:56 AM 01/20/2023    3:02 AM 01/17/2023    4:27 AM  BMP  Glucose 70 - 99 mg/dL 58  284  132   BUN 8 - 23 mg/dL 28  60  52   Creatinine 0.44 - 1.00 mg/dL 4.40  1.02  7.25   Sodium 135 - 145 mmol/L 132  132  127   Potassium 3.5 - 5.1 mmol/L 3.3  4.1  3.9   Chloride 98 - 111 mmol/L 96  92  91   CO2 22 - 32 mmol/L 27  21  21    Calcium 8.9 - 10.3 mg/dL 8.5  9.8  8.3    Outpatient HD unit:  TTS [facility]  4h  400/800   2/2 bath   [facility] TDC   Heparin  4000 + 2000 midrun  EDW was just lowered to 79 kg  - rocaltrol 0.75 mcg po three times per week - mircera 120 mcg  IV q 4 wks - last mircera given 8/10 (not given after discharge)  Just discharged 01/17/23   Assessment/Plan:   # Intractable Nausea/vomiting  - per primary team    # ESRD - HD per TTS schedule    # HTN and volume   - Anasarca noted on prior admission, improving per exam  - optimize volume status with HD  - note that she reports that she takes midodrine prior to outpatient HD and then was instructed to bring a dose to the clinic in case she needs another dose    # Hyponatremia  - Improved with HD/UF   # Anemia of CKD  - Watch for need for ESA - was not given outpatient (last rec'd 8/10)   # Metabolic bone disease  - Continue outpatient regimen when tolerating PO.  Appears to be on renvela  - continue calcitriol 0.75 mcg three times a week  Disposition - per primary team    Estanislado Emms, MD 01/21/2023 9:05 AM

## 2023-01-21 NOTE — Consult Note (Signed)
Reason for Consult: Intractable nausea and vomiting. Referring Physician: [facility] hospital[facility].  Susan [name] is an 72 y.o. female.  HPI: Susan [name] is a 72 year old white female well-known to our practice.  He was admitted briefly last week for presumed colitis which has resolved apparently she was consuming regular meals at that time and started developing nausea and vomiting postdischarge prompting her to come back to the emergency room yesterday.  She has had uncontrolled blood sugars. Due to a prolonged QT C interval she is not able to receive any antiemetics.  1 dose of Phenergan was given to her 12.5 g earlier today which did not seem to help much.  She has some abdominal cramping and averages 1 BM per day.  There is no history of blood or mucus in the stool.  She has been on dialysis 3 times a week. She has been tried on Ativan to see if this will help nausea and vomiting.  In the emergency room her glucose was 309 the right upper quadrant ultrasound showed cholelithiasis without any evidence of cholecystitis and a CT scan showed pulmonary vascular congestion with interstitial prominence and small bilateral effusions EKG revealed right bundle branch block with sinus rhythm and a QTc of 540.  Past Medical History:  Diagnosis Date   Anemia    hx   Anxiety    Arthritis    "generalized" (03/15/2014)   CAD (coronary artery disease)    MI in 2000 - MI  2007 - treated bare metal stent (no nuclear since then as 9/11)   Carotid artery disease (HCC)    Chronic diastolic heart failure (HCC)    a) ECHO (08/2013) EF 55-60% and RV function nl b) RHC (08/2013) RA 4, RV 30/5/7, PA 25/10 (16), PCWP 7, Fick CO/CI 6.3/2.7, PVR 1.5 WU, PA 61 and 66%   Daily headache    "~ every other day; since I fell in June" (03/15/2014)   Depression    Diabetic retinopathy (HCC)    Dyslipidemia    ESRD (end stage renal disease) (HCC)    Dialysis on Tues Thurs Sat   Exertional shortness of breath    History of kidney  stones    HTN (hypertension)    Hypothyroidism    Obesity    Osteoarthritis    PAF (paroxysmal atrial fibrillation) (HCC)    Peripheral neuropathy    bilateral feet/hands   PONV (postoperative nausea and vomiting)    RBBB (right bundle branch block)    Old   Stroke (HCC)    mini strokes   Type II diabetes mellitus (HCC)    Type II, [name]. patient has omnipod insulin pump with Novolin R Insulin   Past Surgical History:  Procedure Laterality Date   A/V FISTULAGRAM Left 11/07/2022   Procedure: A/V Fistulagram;  Surgeon: Leonie Douglas, MD;  [facility];  Service: Cardiovascular;  Laterality: Left;   ABDOMINAL HYSTERECTOMY  1980's   AMPUTATION Right 02/24/2018   Procedure: RIGHT FOOT GREAT TOE AND 2ND TOE AMPUTATION;  Surgeon: Nadara Mustard, MD;  Location: [facility] OR;  Service: Orthopedics;  Laterality: Right;   AMPUTATION Right 04/30/2018   Procedure: RIGHT TRANSMETATARSAL AMPUTATION;  Surgeon: Nadara Mustard, MD;  Location: [facility] OR;  Service: Orthopedics;  Laterality: Right;   AMPUTATION Right 05/02/2022   Procedure: RIGHT BELOW KNEE AMPUTATION;  Surgeon: Nadara Mustard, MD;  Location: [facility] OR;  Service: Orthopedics;  Laterality: Right;   APPLICATION OF WOUND  VAC Right 06/13/2022   Procedure: APPLICATION OF WOUND VAC;  Surgeon: Nadara Mustard, MD;  Location: [facility] OR;  Service: Orthopedics;  Laterality: Right;   APPLICATION OF WOUND VAC Left 11/14/2022   Procedure: APPLICATION OF WOUND VAC;  Surgeon: Nadara Mustard, MD;  [facility] OR;  Service: Orthopedics;  Laterality: Left;   AV FISTULA PLACEMENT Left 04/02/2022   Procedure: LEFT ARM ARTERIOVENOUS (AV) FISTULA CREATION;  Surgeon: Nada Libman, MD;  [facility]: [facility] OR;  Service: Vascular;  Laterality: Left;  PERIPHERAL NERVE BLOCK   BASCILIC VEIN TRANSPOSITION Left 07/31/2022   Procedure: LEFT ARM SECOND STAGE BASILIC VEIN TRANSPOSITION;  Surgeon: Nada Libman, MD;  [facility] OR;  Service: Vascular;   Laterality: Left;   BIOPSY  05/27/2020   Procedure: BIOPSY;  Surgeon: Lanelle Bal, DO;  [facility];  Service: Endoscopy;;   CATARACT EXTRACTION, BILATERAL Bilateral ?2013   COLONOSCOPY W/ POLYPECTOMY     COLONOSCOPY WITH PROPOFOL N/A 03/13/2019   Procedure: COLONOSCOPY WITH PROPOFOL;  Surgeon: Beverley Fiedler, MD;  Location: [facility] ENDOSCOPY;  Service: Gastroenterology;  Laterality: N/A;   CORONARY ANGIOPLASTY WITH STENT PLACEMENT  1999; 2007   "1 + 1"   ERCP N/A 02/03/2022   Procedure: ENDOSCOPIC RETROGRADE CHOLANGIOPANCREATOGRAPHY (ERCP);  Surgeon: Jeani Hawking, MD;  Location: [facility] ENDOSCOPY;  Service: Gastroenterology;  Laterality: N/A;   ESOPHAGOGASTRODUODENOSCOPY (EGD) WITH PROPOFOL N/A 03/13/2019   Procedure: ESOPHAGOGASTRODUODENOSCOPY (EGD) WITH PROPOFOL;  Surgeon: Beverley Fiedler, MD;  Location: [facility] ENDOSCOPY;  Service: Gastroenterology;  Laterality: N/A;   ESOPHAGOGASTRODUODENOSCOPY (EGD) WITH PROPOFOL N/A 05/27/2020   Procedure: ESOPHAGOGASTRODUODENOSCOPY (EGD) WITH PROPOFOL;  Surgeon: Lanelle Bal, DO;  [facility];  Service: Endoscopy;  Laterality: N/A;   ESOPHAGOGASTRODUODENOSCOPY (EGD) WITH PROPOFOL N/A 09/03/2022   Procedure: ESOPHAGOGASTRODUODENOSCOPY (EGD) WITH PROPOFOL;  Surgeon: Jeani Hawking, MD;  Location: [facility] ENDOSCOPY;  Service: Gastroenterology;  Laterality: N/A;   EYE SURGERY Bilateral    lazer   FLEXIBLE SIGMOIDOSCOPY N/A 05/23/2022   Procedure: FLEXIBLE SIGMOIDOSCOPY;  Surgeon: Jeani Hawking, MD;  Location: [facility] ENDOSCOPY;  Service: Gastroenterology;  Laterality: N/A;   FLEXIBLE SIGMOIDOSCOPY N/A 05/24/2022   Procedure: FLEXIBLE SIGMOIDOSCOPY;  Surgeon: Imogene Burn, MD;  Location: [facility] ENDOSCOPY;  Service: Gastroenterology;  Laterality: N/A;   FLEXIBLE SIGMOIDOSCOPY N/A 09/03/2022   Procedure: FLEXIBLE SIGMOIDOSCOPY;  Surgeon: Jeani Hawking, MD;  Location: [facility] ENDOSCOPY;  Service: Gastroenterology;  Laterality: N/A;   HEMOSTASIS CLIP PLACEMENT   03/13/2019   Procedure: HEMOSTASIS CLIP PLACEMENT;  Surgeon: Beverley Fiedler, MD;  Location: [facility] ENDOSCOPY;  Service: Gastroenterology;;   HEMOSTASIS CLIP PLACEMENT  05/23/2022   Procedure: HEMOSTASIS CLIP PLACEMENT;  Surgeon: Jeani Hawking, MD;  Location: [facility] ENDOSCOPY;  Service: Gastroenterology;;   HEMOSTASIS CONTROL  05/24/2022   Procedure: HEMOSTASIS CONTROL;  Surgeon: Imogene Burn, MD;  Location: [facility] ENDOSCOPY;  Service: Gastroenterology;;   HOT HEMOSTASIS N/A 05/23/2022   Procedure: HOT HEMOSTASIS (ARGON PLASMA COAGULATION/BICAP);  Surgeon: Jeani Hawking, MD;  Location: [facility] ENDOSCOPY;  Service: Gastroenterology;  Laterality: N/A;   I & D EXTREMITY Left 05/05/2022   Procedure: IRRIGATION AND DEBRIDEMENT LEFT ARM AV FISTULA;  Surgeon: Cephus Shelling, MD;  Location: [facility] OR;  Service: Vascular;  Laterality: Left;   I & D EXTREMITY N/A 11/14/2022   Procedure: IRRIGATION AND DEBRIDEMENT OF LOWER EXTREMITY WOUND;  Surgeon: Nadara Mustard, MD;  [facility];  Service: Orthopedics;  Laterality: N/A;   INSERTION OF DIALYSIS CATHETER Right 04/02/2022   Procedure:  INSERTION OF TUNNELED DIALYSIS CATHETER;  Surgeon: Nada Libman, MD;  Location: [facility] OR;  Service: Vascular;  Laterality: Right;   KNEE ARTHROSCOPY Left 10/25/2006   POLYPECTOMY  03/13/2019   Procedure: POLYPECTOMY;  Surgeon: Beverley Fiedler, MD;  Location: [facility] ENDOSCOPY;  Service: Gastroenterology;;   REMOVAL OF STONES  02/03/2022   Procedure: REMOVAL OF STONES;  Surgeon: Jeani Hawking, MD;  Location: [facility] ENDOSCOPY;  Service: Gastroenterology;;   REVISON OF ARTERIOVENOUS FISTULA Left 08/20/2022   Procedure: REVISON OF LEFT ARM ARTERIOVENOUS FISTULA;  Surgeon: Nada Libman, MD;  [facility];  Service: Vascular;  Laterality: Left;   RIGHT HEART CATH N/A 07/24/2017   Procedure: RIGHT HEART CATH;  Surgeon: Dolores Patty, MD;  [facility];  Service: Cardiovascular;  Laterality: N/A;   RIGHT HEART CATHETERIZATION N/A  09/22/2013   Procedure: RIGHT HEART CATH;  Surgeon: Dolores Patty, MD;  Location: [facility] CATH LAB;  Service: Cardiovascular;  Laterality: N/A;   SHOULDER ARTHROSCOPY WITH OPEN ROTATOR CUFF REPAIR Right 03/14/2014   Procedure: RIGHT SHOULDER ARTHROSCOPY WITH BICEPS RELEASE, OPEN SUBSCAPULA REPAIR, OPEN SUPRASPINATUS REPAIR.;  Surgeon: Cammy Copa, MD;  Location: [facility] OR;  Service: Orthopedics;  Laterality: Right;   SPHINCTEROTOMY  02/03/2022   Procedure: SPHINCTEROTOMY;  Surgeon: Jeani Hawking, MD;  Location: [facility] ENDOSCOPY;  Service: Gastroenterology;;   STUMP REVISION Right 06/13/2022   Procedure: REVISION RIGHT BELOW KNEE AMPUTATION;  Surgeon: Nadara Mustard, MD;  Location: [facility] OR;  Service: Orthopedics;  Laterality: Right;   TEE WITHOUT CARDIOVERSION N/A 02/04/2022   Procedure: TRANSESOPHAGEAL ECHOCARDIOGRAM (TEE);  Surgeon: Dolores Patty, MD;  Location: [facility] ENDOSCOPY;  Service: Cardiovascular;  Laterality: N/A;   THROMBECTOMY W/ EMBOLECTOMY Left 08/20/2022   Procedure: THROMBECTOMY OF LEFT ARM ARTERIOVENOUS FISTULA;  Surgeon: Nada Libman, MD;  [facility];  Service: Vascular;  Laterality: Left;   TOE AMPUTATION Right 02/24/2018   GREAT TOE AND 2ND TOE AMPUTATION   TUBAL LIGATION  1970's   Family History  Problem Relation Age of Onset   Heart attack Mother 43   Social History:  reports that she quit smoking about 25 years ago. Her smoking use included cigarettes. She started smoking about 57 years ago. She has a 96 pack-year smoking history. She has never used smokeless tobacco. She reports that she does not currently use alcohol. She reports that she does not use drugs.  Allergies:  Allergies  Allergen Reactions   Cephalexin Diarrhea and Other (See Comments)   Codeine Nausea And Vomiting and Other (See Comments)   Medications: I have reviewed the patient's current medications. Prior to Admission:  Medications Prior to Admission  Medication Sig Dispense Refill Last Dose    acetaminophen (TYLENOL) 325 MG tablet Take 2 tablets (650 mg total) by mouth every 6 (six) hours as needed for mild pain (or Fever >/= 101).   Past Week   albuterol (VENTOLIN HFA) 108 (90 Base) MCG/ACT inhaler Inhale 1-2 puffs into the lungs every 6 (six) hours as needed for wheezing or shortness of breath. 6.7 g 0 Past Week   amiodarone (PACERONE) 200 MG tablet Take 1 tablet (200 mg total) by mouth daily. 30 tablet 0 01/21/2023   apixaban (ELIQUIS) 5 MG TABS tablet Take 1 tablet (5 mg total) by mouth 2 (two) times daily. 60 tablet 0 01/21/2023 at 0900-1000   ascorbic acid (VITAMIN C) 500 MG tablet Take 1 tablet (500 mg total) by mouth daily. 100 tablet 0 01/17/2023  atorvastatin (LIPITOR) 80 MG tablet TAKE 1 TABLET BY MOUTH EVERY DAY 90 tablet 0 01/21/2023   carvedilol (COREG) 6.25 MG tablet Take 6.25 mg by mouth 2 (two) times daily with a meal.   01/21/2023   cefdinir (OMNICEF) 300 MG capsule Take 1 capsule (300 mg total) by mouth every other day. 2 capsule 0 01/19/2023   clonazePAM (KLONOPIN) 0.5 MG disintegrating tablet Take 1 tablet (0.5 mg total) by mouth 2 (two) times daily. 60 tablet 1 Past Week   folic acid (FOLVITE) 1 MG tablet Take 1 tablet (1 mg total) by mouth daily. 90 tablet 1 01/19/2023   insulin aspart (NOVOLOG FLEXPEN) 100 UNIT/ML FlexPen Inject 0-15 Units into the skin See admin instructions. Inject 12 units subcutaneously 3 times daily with meals (0900, 1300 & 1700) & as needed via sliding scale insulin CBG <70=Notify NP/MD 71-149=0 units,150-199=4 units, 200-249=6 units,250- 299=10 units, 300-349=12 units, 350-399=15 units, > 400 call MD ( prime pen with 2 units prior  to set dose) (Patient taking differently: Inject 0-16 Units into the skin 3 (three) times daily with meals.) 15 mL 0 01/20/2023   insulin glargine (LANTUS) 100 UNIT/ML Solostar Pen Inject 12 units into the skin at bedtime (Max 40 units) 30 mL 11 01/18/2023   levothyroxine (SYNTHROID) 75 MCG tablet Take 1 tablet (75 mcg  total) by mouth daily before breakfast. 30 tablet 0 01/21/2023   methocarbamol (ROBAXIN) 750 MG tablet Take 1 tablet (750 mg total) by mouth every 6 (six) hours as needed for muscle spasms. 60 tablet 0 01/17/2023   midodrine (PROAMATINE) 10 MG tablet Take 1 tablet (10 mg total) by mouth Every Tuesday,Thursday,and Saturday with dialysis. 30 tablet 0 01/17/2023   pantoprazole (PROTONIX) 40 MG tablet Take 1 tablet (40 mg total) by mouth 2 (two) times daily before a meal. 180 tablet 1 01/17/2023   sevelamer carbonate (RENVELA) 800 MG tablet Take 2 tablets (1,600 mg total) by mouth 3 (three) times daily with meals. (0800, 1200 & 1700) 180 tablet 0 unknown   venlafaxine XR (EFFEXOR-XR) 75 MG 24 hr capsule Take 1 capsule (75 mg total) by mouth daily with breakfast. Stop trintellix 30 capsule 3 Past Week   Zinc Sulfate 220 (50 Zn) MG TABS Take 1 tablet (220 mg total) by mouth daily. 100 tablet 0 01/19/2023   metoCLOPramide (REGLAN) 5 MG tablet Take 1 tablet (5 mg total) by mouth 3 (three) times daily before meals. (Patient not taking: Reported on 01/21/2023) 90 tablet 3 Not Taking   polyethylene glycol (MIRALAX / GLYCOLAX) 17 g packet Take 17 g by mouth daily. (Patient taking differently: Take 17 g by mouth See admin instructions. Every other day)   01/18/2023   ramelteon (ROZEREM) 8 MG tablet Take 1 tablet (8 mg total) by mouth at bedtime. (Patient not taking: Reported on 01/21/2023) 30 tablet 0 Not Taking   senna-docusate (SENOKOT-S) 8.6-50 MG tablet Take 2 tablets by mouth at bedtime. (Patient not taking: Reported on 01/21/2023)   Not Taking   Scheduled:  apixaban  5 mg Oral BID   atorvastatin  80 mg Oral Daily   calcitRIOL  0.75 mcg Oral Daily   carvedilol  6.25 mg Oral BID WC   Chlorhexidine Gluconate Cloth  6 each Topical Q0600   clonazePAM  0.5 mg Oral BID   folic acid  1 mg Oral Daily   insulin aspart  0-9 Units Subcutaneous TID WC   insulin glargine-yfgn  6 Units Subcutaneous QHS   levothyroxine  75  mcg Oral QAC breakfast   midodrine  10 mg Oral Once in dialysis   pantoprazole (PROTONIX) IV  40 mg Intravenous Q24H   sevelamer carbonate  1,600 mg Oral TID WC   sucralfate  1 g Oral TID WC & HS   Continuous:  sodium chloride 50 mL/hr at 01/21/23 1532   [id number] **OR** acetaminophen, albuterol, LORazepam, prochlorperazine  Results for orders placed or performed during the hospital encounter of 01/20/23 (from the past 48 hour(s))  Lipase, blood     Status: None   Collection Time: 01/20/23  3:02 AM  Result Value Ref Range   Lipase 31 11 - 51 U/L    Comment: Performed at [facility], [address] [place], [place] [address]  Comprehensive metabolic panel     Status: Abnormal   Collection Time: 01/20/23  3:02 AM  Result Value Ref Range   Sodium 132 (L) 135 - 145 mmol/L   Potassium 4.1 3.5 - 5.1 mmol/L   Chloride 92 (L) 98 - 111 mmol/L   CO2 21 (L) 22 - 32 mmol/L   Glucose, Bld 309 (H) 70 - 99 mg/dL    Comment: Glucose reference range applies only to samples taken after fasting for at least 8 hours.   BUN 60 (H) 8 - 23 mg/dL   Creatinine, Ser 6.57 (H) 0.44 - 1.00 mg/dL   Calcium 9.8 8.9 - 84.6 mg/dL   Total Protein 7.4 6.5 - 8.1 g/dL   Albumin 3.2 (L) 3.5 - 5.0 g/dL   AST 15 15 - 41 U/L   ALT 18 0 - 44 U/L   Alkaline Phosphatase 243 (H) 38 - 126 U/L   Total Bilirubin 0.5 0.3 - 1.2 mg/dL   GFR, Estimated 8 (L) >60 mL/min    Comment: (NOTE) Calculated using the CKD-EPI Creatinine Equation (2021)    Anion gap 19 (H) 5 - 15    Comment: Performed at [facility], [address] [place], [place] [address]  CBC     Status: Abnormal   Collection Time: 01/20/23  3:02 AM  Result Value Ref Range   WBC 10.5 4.0 - 10.5 K/uL   RBC 5.04 3.87 - 5.11 MIL/uL   Hemoglobin 11.2 (L) 12.0 - 15.0 g/dL   HCT 28.4 13.2 - 44.0 %   MCV 74.2 (L) 80.0 - 100.0 fL   MCH 22.2 (L) 26.0 - 34.0 pg   MCHC 29.9 (L) 30.0 - 36.0 g/dL   RDW 10.2 (H) 72.5 - 36.6 %   Platelets 338 150  - 400 K/uL   nRBC 0.0 0.0 - 0.2 %    Comment: Performed at [facility], [address], [place], [place] [address]  Hepatitis B surface antigen     Status: None   Collection Time: 01/20/23  3:02 AM  Result Value Ref Range   Hepatitis B Surface Ag NON REACTIVE NON REACTIVE    Comment: Performed at [facility], [address] [place], [place] [address]  Hepatitis B surface antibody,quantitative     Status: None   Collection Time: 01/20/23  3:02 AM  Result Value Ref Range   Hep B S AB Quant (Post) 2,491.0 Immunity>10 mIU/mL    Comment: (NOTE) Results confirmed on dilution.  Status of Immunity                     Anti-HBs Level  ------------------                     --------------  Inconsistent with Immunity                  0.0 - 10.0 Consistent with Immunity                         >10.0 Performed At: [facility] [address] [place], [place] [id number] Jolene Schimke MD WU:[id number]   CBG monitoring, ED     Status: Abnormal   Collection Time: 01/20/23  3:54 AM  Result Value Ref Range   Glucose-Capillary 280 (H) 70 - 99 mg/dL    Comment: Glucose reference range applies only to samples taken after fasting for at least 8 hours.  Glucose, capillary     Status: Abnormal   Collection Time: 01/20/23 10:32 AM  Result Value Ref Range   Glucose-Capillary 272 (H) 70 - 99 mg/dL    Comment: Glucose reference range applies only to samples taken after fasting for at least 8 hours.  Glucose, capillary     Status: Abnormal   Collection Time: 01/20/23  6:33 PM  Result Value Ref Range   Glucose-Capillary 107 (H) 70 - 99 mg/dL    Comment: Glucose reference range applies only to samples taken after fasting for at least 8 hours.  MRSA Next Gen by PCR, Nasal     Status: None   Collection Time: 01/20/23  6:56 PM   Specimen: Nasal Mucosa; Nasal Swab  Result Value Ref Range   MRSA by PCR Next Gen NOT DETECTED NOT DETECTED    Comment: (NOTE) The GeneXpert MRSA Assay (FDA  approved for NASAL specimens only), is one component of a comprehensive MRSA colonization surveillance program. It is not intended to diagnose MRSA infection nor to guide or monitor treatment for MRSA infections. Test performance is not FDA approved in patients less than 42 years old. Performed at [facility], [address] [place], [place] [address]   Glucose, capillary     Status: None   Collection Time: 01/20/23  9:09 PM  Result Value Ref Range   Glucose-Capillary 96 70 - 99 mg/dL    Comment: Glucose reference range applies only to samples taken after fasting for at least 8 hours.  CBC     Status: Abnormal   Collection Time: 01/21/23  4:56 AM  Result Value Ref Range   WBC 7.3 4.0 - 10.5 K/uL   RBC 4.65 3.87 - 5.11 MIL/uL   Hemoglobin 10.3 (L) 12.0 - 15.0 g/dL   HCT 30.8 (L) 65.7 - 84.6 %   MCV 74.4 (L) 80.0 - 100.0 fL   MCH 22.2 (L) 26.0 - 34.0 pg   MCHC 29.8 (L) 30.0 - 36.0 g/dL   RDW 96.2 (H) 95.2 - 84.1 %   Platelets 285 150 - 400 K/uL   nRBC 0.0 0.0 - 0.2 %    Comment: Performed at [facility], [address] [place], [place] [address]  Comprehensive metabolic panel     Status: Abnormal   Collection Time: 01/21/23  4:56 AM  Result Value Ref Range   Sodium 132 (L) 135 - 145 mmol/L   Potassium 3.3 (L) 3.5 - 5.1 mmol/L   Chloride 96 (L) 98 - 111 mmol/L   CO2 27 22 - 32 mmol/L   Glucose, Bld 58 (L) 70 - 99 mg/dL    Comment: Glucose reference range applies only to samples taken after fasting for at least 8 hours.   BUN 28 (H) 8 - 23 mg/dL  Creatinine, Ser 3.40 (H) 0.44 - 1.00 mg/dL   Calcium 8.5 (L) 8.9 - 10.3 mg/dL   Total Protein 5.9 (L) 6.5 - 8.1 g/dL   Albumin 2.5 (L) 3.5 - 5.0 g/dL   AST 13 (L) 15 - 41 U/L   ALT 14 0 - 44 U/L   Alkaline Phosphatase 177 (H) 38 - 126 U/L   Total Bilirubin 0.4 0.3 - 1.2 mg/dL   GFR, Estimated 14 (L) >60 mL/min    Comment: (NOTE) Calculated using the CKD-EPI Creatinine Equation (2021)    Anion gap 9 5 - 15     Comment: Performed at [facility], [address] [place], [place] [address]  Glucose, capillary     Status: None   Collection Time: 01/21/23  7:32 AM  Result Value Ref Range   Glucose-Capillary 82 70 - 99 mg/dL    Comment: Glucose reference range applies only to samples taken after fasting for at least 8 hours.  Glucose, capillary     Status: Abnormal   Collection Time: 01/21/23 11:31 AM  Result Value Ref Range   Glucose-Capillary 200 (H) 70 - 99 mg/dL    Comment: Glucose reference range applies only to samples taken after fasting for at least 8 hours.   *Note: Due to a large number of results and/or encounters for the requested time period, some results have not been displayed. A complete set of results can be found in Results Review.    CT ABDOMEN PELVIS W CONTRAST  Result Date: 01/20/2023 CLINICAL DATA:  Right lower quadrant abdominal pain EXAM: CT ABDOMEN AND PELVIS WITH CONTRAST TECHNIQUE: Multidetector CT imaging of the abdomen and pelvis was performed using the standard protocol following bolus administration of intravenous contrast. RADIATION DOSE REDUCTION: This exam was performed according to the departmental dose-optimization program which includes automated exposure control, adjustment of the mA and/or kV according to patient size and/or use of iterative reconstruction technique. CONTRAST:  75mL OMNIPAQUE IOHEXOL 350 MG/ML SOLN COMPARISON:  01/15/2023 FINDINGS: Lower chest: Interstitial lobular septal thickening and small pleural effusions at the lung bases. Coronary atherosclerosis. Hepatobiliary: Lobulated appearance of the inferior lobe right liver but no generalized surface lobulation or other typical morphologic changes of cirrhosis.Layering gallstones. No evidence of biliary inflammation or ductal dilatation. Pancreas: Unremarkable. Spleen: Hypoenhancing areas in the anterior spleen are stable and most consistent with infarct. Scarring in the subcapsular high left spleen  Adrenals/Urinary Tract: Negative adrenals. No hydronephrosis or stone. Renal cortical lobulation likely from scarring, especially on the right. Unremarkable bladder. Stomach/Bowel: No evidence of bowel obstruction or inflammation. No convincing bowel wall thickening aside from areas of luminal collapse. No appendicitis. Numerous distal colonic diverticula. Vascular/Lymphatic: No acute vascular abnormality. Extensive atheromatous calcification of the aorta and branch vessels. No mass or adenopathy. Reproductive:Hysterectomy Other: No ascites or pneumoperitoneum. Musculoskeletal: No acute abnormalities. IMPRESSION: 1. No specific cause for right lower quadrant pain. No appendicitis. 2. Pulmonary edema and small pleural effusions seen at the lung bases 3. No progression of small splenic infarcts since prior. 4. Cholelithiasis, colonic diverticulosis, and extensive atheromatous calcification. Electronically Signed   By: Tiburcio Pea M.D.   On: 01/20/2023 07:16   US Abdomen Limited RUQ (LIVER/GB)  Result Date: 01/20/2023 CLINICAL DATA:  Right upper quadrant abdominal pain EXAM: ULTRASOUND ABDOMEN LIMITED RIGHT UPPER QUADRANT COMPARISON:  01/15/2023 FINDINGS: Gallbladder: No gallbladder wall thickening, sludge or pericholecystic fluid. Gallstones are identified which measure up to 1.5 cm. Negative sonographic Murphy's sign. Common bile duct: Diameter: 5.1  mm Liver: No focal lesion identified. Within normal limits in parenchymal echogenicity. Portal vein is patent on color Doppler imaging with normal direction of blood flow towards the liver. Other: None. IMPRESSION: Cholelithiasis. No secondary signs of acute cholecystitis. Electronically Signed   By: Signa Kell M.D.   On: 01/20/2023 05:46   DG Chest Portable 1 View  Result Date: 01/20/2023 CLINICAL DATA:  Shortness of breath.  Nausea and vomiting. EXAM: PORTABLE CHEST 1 VIEW COMPARISON:  01/15/2023. FINDINGS: Heart is enlarged and the mediastinal contour  is within normal limits. There is atherosclerotic calcification of the aorta. The pulmonary vasculature is distended. Interstitial prominence is present bilaterally. There are small bilateral pleural effusions. No pneumothorax is seen. A right internal jugular central venous catheter is stable. No acute osseous abnormality. IMPRESSION: 1. Cardiomegaly with pulmonary vascular congestion. 2. Interstitial prominence bilaterally, likely edema, not significantly changed from the prior exam. 3. Small bilateral pleural effusions. Electronically Signed   By: Thornell Sartorius M.D.   On: 01/20/2023 04:18    Review of Systems  Constitutional:  Positive for appetite change and fatigue. Negative for activity change, chills and diaphoresis.  HENT: Negative.    Eyes: Negative.   Respiratory: Negative.    Cardiovascular: Negative.   Gastrointestinal:  Positive for nausea and vomiting. Negative for abdominal distention, abdominal pain, anal bleeding, blood in stool, constipation, diarrhea and rectal pain.  Endocrine: Negative.   Genitourinary: Negative.   Musculoskeletal:  Positive for arthralgias.  Allergic/Immunologic: Negative.   Neurological:  Positive for weakness.  Hematological: Negative.   Psychiatric/Behavioral:  The patient is not nervous/anxious.    Blood pressure (!) 159/73, pulse 81, temperature 97.6 F (36.4 C), resp. rate 16, weight 81.7 kg, SpO2 100%. Physical Exam Constitutional:      Appearance: She is ill-appearing and diaphoretic.  HENT:     Head: Normocephalic and atraumatic.     Mouth/Throat:     Mouth: Mucous membranes are dry.  Eyes:     Extraocular Movements: Extraocular movements intact.     Pupils: Pupils are equal, round, and reactive to light.  Cardiovascular:     Rate and Rhythm: Normal rate and regular rhythm.  Pulmonary:     Effort: Pulmonary effort is normal.     Breath sounds: Normal breath sounds.  Abdominal:     General: Bowel sounds are normal.     Palpations:  Abdomen is soft.     Tenderness: There is no abdominal tenderness. There is no guarding.  Musculoskeletal:     Cervical back: Neck supple.  Skin:    General: Skin is warm.  Neurological:     Mental Status: She is alert and oriented to person, place, and time.   Assessment and plan 1) Intractable nausea and vomiting of unclear etiology possibly due to gastroparesis; I think we can try Phenergan 25 mg 3 to 2-3 times a day along with Ativan to see if this helps her symptoms I do not feel a gastric emptying study will be helpful at this time as we cannot give her Reglan either; Agree with gentle IV hydration with monitoring of electrolytes and correction of electrolytes as needed. 2) Anemia of chronic disease currently stable. 3) Chronic systolic congestive heart failure. 4) History of paroxysmal atrial fibrillation currently in sinus rhythm and on Coreg and Eliquis. 5) CAD. 6) Hypothyroidism on thyroxine supplements. 7) AODM on sliding scale coverage at home/history of diabetic retinopathy. 8) PVD-s/p BKA on thr right side; non-healing ulcer on left lower leg-s/p debridement  with skin grafting of the left lower leg. 9) GERD-on PPI's 10) Cholelithiasis without any evidence of acute cholecystitis. 11) Colonic diverticulosis. 12) Prolonged QTc interval. 13) ESRD-on hemodialysis 3 times a week. 14) HTN. 15) Hyperlipidemia.   Susan [name] 01/21/2023, 12:28 PM

## 2023-01-21 NOTE — TOC CM/SW Note (Addendum)
Transition of Care [facility]) - Inpatient Brief Assessment   Patient Details  Name: Susan [name] MRN: [id number] Date of Birth: [birth year]  Transition of Care [facility]) CM/SW Contact:    Tom-Johnson, Hershal Coria, RN Phone Number: 01/21/2023, 3:11 PM   Clinical Narrative:  Patient presented to the ED with N/V and Abdominal pains. Recently admitted with Colitis. Recent wound debridement with grafting to Lt lower leg. Has hx of ESRD, on TTS outpatient Dialysis schedule. Nephrology following.   From home with husband, has two supportive children. Has Rt BKA. Has a cane, walker and wheelchair at home. States she is scheduled to pick up Rt Prosthesis 02/04/23.   PCP is [name], Priscille Heidelberg, MD and uses [facility] on [address]   No TOC needs or recommendations noted at this time.  Patient not Medically ready for discharge.  CM will continue to follow as patient progresses with care towards discharge.         Transition of Care Asessment: Insurance and Status: Insurance coverage has been reviewed Patient has primary care physician: Yes Home environment has been reviewed: Yes Prior level of function:: Modified independent. Rt BKA Prior/Current Home Services: No current home services Social Determinants of Health Reivew: SDOH reviewed no interventions necessary Readmission risk has been reviewed: Yes Transition of care needs: transition of care needs identified, TOC will continue to follow

## 2023-01-21 NOTE — Progress Notes (Signed)
PROGRESS NOTE    Susan [name]  MWN:[id number] DOB: Aug 05, 1950 DOA: 01/20/2023 PCP: Donita Brooks, MD    Brief Narrative:  72 year old chronically sick lady with history of ESRD on hemodialysis TTS schedule, peripheral vascular disease status post right BKA, recent left wound debridement and skin grafting by Dr. Lajoyce Corners, chronic combined heart failure with ejection fraction 30%, wheelchair-bound, hypothyroidism, A-fib on Eliquis who was recently admitted with nausea vomiting and colitis presents back to the hospital with recurrent nausea vomiting and unable to eat anything.  In the emergency room afebrile.  WC count normal.  Glucose 309.  Upper quadrant ultrasound with cholelithiasis without evidence of acute cholecystitis.  CT scan consistent with pulmonary vascular congestion, interstitial prominence, small bilateral pleural effusion.  EKG with right bundle branch block, sinus rhythm, QTc 540.  Due to persistent nausea vomiting, she was given Ativan, Zofran and promethazine and admission requested.   Assessment & Plan:   Intractable nausea and vomiting with recent episode of colitis: Persistent symptoms.  CT scan with evidence of improving colitis.  Possible gastroparesis or remaining symptoms from previous episode. Patient with prolonged QTc, limitation on using antiemetics. Currently on PPI, Ativan as needed, occasional use of promethazine.  GI consulted, recommendations pending.  Continue supportive care. Patient is clinically dehydrated with persistent symptoms, will start patient on low-dose IV fluids. Not tolerating solid food, will scale back to clear liquid diet until clinical improvement. CT scan, right upper quadrant ultrasound without any acute findings.  Chronic systolic congestive heart failure: Managed with hemodialysis.  Nephrology following.  Treating with low dose of IV fluids overnight. For dialysis tomorrow.  Chronic medical issues including Paroxysmal A-fib, currently  in sinus rhythm.  On Coreg and Eliquis. Coronary artery disease, status post stent.  Stable. Patient is hypertensive, blood pressure stable. Anemia of chronic disease, hemoglobin stable Hypothyroidism, on thyroxine and is stable. Type 2 diabetes, well-controlled.  Currently with poor appetite.  On long-acting insulin and low-dose sliding scale insulin.  Peripheral vascular disease Status post right BKA.  Stable. Status post debridement and skin grafting left leg, I am trying to reach Dr. Lajoyce Corners or his office for instructions as this is due for dressing changes.     DVT prophylaxis:  apixaban (ELIQUIS) tablet 5 mg   Code Status: Full code Family Communication: None at bedside in the morning rounds Disposition Plan: Status is: Inpatient Remains inpatient appropriate because: Unable to eat, persistent nausea.     Consultants:  Nephrology GI  Procedures:  None  Antimicrobials:  None   Subjective: Patient seen in the morning rounds.  Retching and nauseated.  Some abdominal discomfort but denies any obvious pain.  She vomited all her breakfast.  Early morning blood sugars were low.  Objective: Vitals:   01/20/23 1833 01/20/23 2107 01/21/23 0504 01/21/23 0807  BP: 134/72 (!) 116/54 (!) 121/50 (!) 159/73  Pulse: 76 78 65 81  Resp: 16 13 16 16   Temp:  97.6 F (36.4 C) (!) 97.3 F (36.3 C) 97.6 F (36.4 C)  TempSrc:      SpO2: 100% 100% 100% 100%  Weight:        Intake/Output Summary (Last 24 hours) at 01/21/2023 1348 Last data filed at 01/21/2023 1251 Gross per 24 hour  Intake 120 ml  Output 3200 ml  Net -3080 ml   Filed Weights   01/20/23 0300 01/20/23 1352 01/20/23 1749  Weight: 80 kg 84.7 kg 81.7 kg    Examination:  General exam: Appears sick.  Anxious.  On room air. Respiratory system: No added sounds. Cardiovascular system: S1 & S2 heard, RRR.  Gastrointestinal system: Abdomen is nondistended, soft and nontender. No organomegaly or masses felt. Normal  bowel sounds heard. Central nervous system: Alert and oriented. No focal neurological deficits. Extremities: Symmetric 5 x 5 power. Skin:  Right BKA stump clean and dry. Left leg on postsurgical dressing, poor peripheral vascular status.  Normal color.    Data Reviewed: I have personally reviewed following labs and imaging studies  CBC: Recent Labs  Lab 01/15/23 0513 01/16/23 0404 01/17/23 0427 01/20/23 0302 01/21/23 0456  WBC 8.6 10.4 7.0 10.5 7.3  HGB 10.6* 10.1* 9.2* 11.2* 10.3*  HCT 35.9* 33.1* 29.2* 37.4 34.6*  MCV 75.6* 75.6* 74.1* 74.2* 74.4*  PLT 277 252 221 338 285   Basic Metabolic Panel: Recent Labs  Lab 01/15/23 1514 01/16/23 0404 01/17/23 0427 01/20/23 0302 01/21/23 0456  NA 127* 133* 127* 132* 132*  K 3.7 3.4* 3.9 4.1 3.3*  CL 90* 95* 91* 92* 96*  CO2 19* 28 21* 21* 27  GLUCOSE 407* 170* 177* 309* 58*  BUN 57* 28* 52* 60* 28*  CREATININE 4.30* 2.70* 4.08* 5.12* 3.40*  CALCIUM 8.9 8.7* 8.3* 9.8 8.5*  PHOS 5.2* 3.4 4.8*  --   --    GFR: Estimated Creatinine Clearance: 15.6 mL/min (A) (by C-G formula based on SCr of 3.4 mg/dL (H)). Liver Function Tests: Recent Labs  Lab 01/15/23 0513 01/16/23 0404 01/17/23 0427 01/20/23 0302 01/21/23 0456  AST 19  --   --  15 13*  ALT 23  --   --  18 14  ALKPHOS 300*  --   --  243* 177*  BILITOT 0.8  --   --  0.5 0.4  PROT 7.4  --   --  7.4 5.9*  ALBUMIN 3.0* 2.4* 2.4* 3.2* 2.5*   Recent Labs  Lab 01/15/23 0513 01/20/23 0302  LIPASE 29 31   No results for input(s): "AMMONIA" in the last 168 hours. Coagulation Profile: No results for input(s): "INR", "PROTIME" in the last 168 hours. Cardiac Enzymes: No results for input(s): "CKTOTAL", "CKMB", "CKMBINDEX", "TROPONINI" in the last 168 hours. BNP (last 3 results) No results for input(s): "PROBNP" in the last 8760 hours. HbA1C: No results for input(s): "HGBA1C" in the last 72 hours. CBG: Recent Labs  Lab 01/20/23 1032 01/20/23 1833 01/20/23 2109  01/21/23 0732 01/21/23 1131  GLUCAP 272* 107* 96 82 200*   Lipid Profile: No results for input(s): "CHOL", "HDL", "LDLCALC", "TRIG", "CHOLHDL", "LDLDIRECT" in the last 72 hours. Thyroid Function Tests: No results for input(s): "TSH", "T4TOTAL", "FREET4", "T3FREE", "THYROIDAB" in the last 72 hours. Anemia Panel: No results for input(s): "VITAMINB12", "FOLATE", "FERRITIN", "TIBC", "IRON", "RETICCTPCT" in the last 72 hours. Sepsis Labs: No results for input(s): "PROCALCITON", "LATICACIDVEN" in the last 168 hours.  Recent Results (from the past 240 hour(s))  MRSA Next Gen by PCR, Nasal     Status: None   Collection Time: 01/20/23  6:56 PM   Specimen: Nasal Mucosa; Nasal Swab  Result Value Ref Range Status   MRSA by PCR Next Gen NOT DETECTED NOT DETECTED Final    Comment: (NOTE) The GeneXpert MRSA Assay (FDA approved for NASAL specimens only), is one component of a comprehensive MRSA colonization surveillance program. It is not intended to diagnose MRSA infection nor to guide or monitor treatment for MRSA infections. Test performance is not FDA approved in patients less than 52 years old. Performed at  [facility], [name], [place], [place] [address]          Radiology Studies: CT ABDOMEN PELVIS W CONTRAST  Result Date: 01/20/2023 CLINICAL DATA:  Right lower quadrant abdominal pain EXAM: CT ABDOMEN AND PELVIS WITH CONTRAST TECHNIQUE: Multidetector CT imaging of the abdomen and pelvis was performed using the standard protocol following bolus administration of intravenous contrast. RADIATION DOSE REDUCTION: This exam was performed according to the departmental dose-optimization program which includes automated exposure control, adjustment of the mA and/or kV according to patient size and/or use of iterative reconstruction technique. CONTRAST:  75mL OMNIPAQUE IOHEXOL 350 MG/ML SOLN COMPARISON:  01/15/2023 FINDINGS: Lower chest: Interstitial lobular septal thickening and  small pleural effusions at the lung bases. Coronary atherosclerosis. Hepatobiliary: Lobulated appearance of the inferior lobe right liver but no generalized surface lobulation or other typical morphologic changes of cirrhosis.Layering gallstones. No evidence of biliary inflammation or ductal dilatation. Pancreas: Unremarkable. Spleen: Hypoenhancing areas in the anterior spleen are stable and most consistent with infarct. Scarring in the subcapsular high left spleen Adrenals/Urinary Tract: Negative adrenals. No hydronephrosis or stone. Renal cortical lobulation likely from scarring, especially on the right. Unremarkable bladder. Stomach/Bowel: No evidence of bowel obstruction or inflammation. No convincing bowel wall thickening aside from areas of luminal collapse. No appendicitis. Numerous distal colonic diverticula. Vascular/Lymphatic: No acute vascular abnormality. Extensive atheromatous calcification of the aorta and branch vessels. No mass or adenopathy. Reproductive:Hysterectomy Other: No ascites or pneumoperitoneum. Musculoskeletal: No acute abnormalities. IMPRESSION: 1. No specific cause for right lower quadrant pain. No appendicitis. 2. Pulmonary edema and small pleural effusions seen at the lung bases 3. No progression of small splenic infarcts since prior. 4. Cholelithiasis, colonic diverticulosis, and extensive atheromatous calcification. Electronically Signed   By: Tiburcio Pea M.D.   On: 01/20/2023 07:16   US Abdomen Limited RUQ (LIVER/GB)  Result Date: 01/20/2023 CLINICAL DATA:  Right upper quadrant abdominal pain EXAM: ULTRASOUND ABDOMEN LIMITED RIGHT UPPER QUADRANT COMPARISON:  01/15/2023 FINDINGS: Gallbladder: No gallbladder wall thickening, sludge or pericholecystic fluid. Gallstones are identified which measure up to 1.5 cm. Negative sonographic Murphy's sign. Common bile duct: Diameter: 5.1 mm Liver: No focal lesion identified. Within normal limits in parenchymal echogenicity. Portal vein  is patent on color Doppler imaging with normal direction of blood flow towards the liver. Other: None. IMPRESSION: Cholelithiasis. No secondary signs of acute cholecystitis. Electronically Signed   By: Signa Kell M.D.   On: 01/20/2023 05:46   DG Chest Portable 1 View  Result Date: 01/20/2023 CLINICAL DATA:  Shortness of breath.  Nausea and vomiting. EXAM: PORTABLE CHEST 1 VIEW COMPARISON:  01/15/2023. FINDINGS: Heart is enlarged and the mediastinal contour is within normal limits. There is atherosclerotic calcification of the aorta. The pulmonary vasculature is distended. Interstitial prominence is present bilaterally. There are small bilateral pleural effusions. No pneumothorax is seen. A right internal jugular central venous catheter is stable. No acute osseous abnormality. IMPRESSION: 1. Cardiomegaly with pulmonary vascular congestion. 2. Interstitial prominence bilaterally, likely edema, not significantly changed from the prior exam. 3. Small bilateral pleural effusions. Electronically Signed   By: Thornell Sartorius M.D.   On: 01/20/2023 04:18        Scheduled Meds:  apixaban  5 mg Oral BID   atorvastatin  80 mg Oral Daily   calcitRIOL  0.75 mcg Oral Daily   carvedilol  6.25 mg Oral BID WC   Chlorhexidine Gluconate Cloth  6 each Topical Q0600   clonazePAM  0.5 mg Oral  BID   folic acid  1 mg Oral Daily   insulin aspart  0-9 Units Subcutaneous TID WC   insulin glargine-yfgn  6 Units Subcutaneous QHS   levothyroxine  75 mcg Oral QAC breakfast   midodrine  10 mg Oral Once in dialysis   pantoprazole (PROTONIX) IV  40 mg Intravenous Q24H   sevelamer carbonate  1,600 mg Oral TID WC   sucralfate  1 g Oral TID WC & HS   Continuous Infusions:  sodium chloride 50 mL/hr at 01/21/23 1200     LOS: 1 day    Time spent: 35 minutes    [name], MD [facility]

## 2023-01-21 NOTE — Consult Note (Signed)
[facility] Customer service manager [facility]) Accountable Care Organization (ACO) [facility] Liaison Note  01/21/2023  Susan [name] April 25, 1951 [id number]  Location: [facility] Liaison met patient at bedside at [facility]. Covering [name], RN as hospital liaison.  Insurance: [employer]   Susan [name] is a 72 y.o. female who is a Primary Care Patient of [name], Priscille Heidelberg, MD with [facility]. The patient was screened for 7 and 30 day readmission hospitalization with noted extreme risk score for unplanned readmission risk with 5 IP/1 ED in 6 months.  The patient was assessed for potential [facility]) Care Management service needs for post hospital transition for care coordination. Review of patient's electronic medical record reveals patient was admitted with intractable nausea and vomiting. Liaison met with pt and spouse at bedside and introduced the purpose for today. PING indicates pt active with [facility]. Liaison inquired as pt aware of [facility] service however indicated she has not seen them in awhile. Liaison contacted agency spoke with Vonica,RN ([facility]) who indicated history that the pt and daughter have declined [facility] so services have been out on suspension. States based upon pt's level of care with [facility] she would be able to obtain a home visit every 3 months. Pt and spouse have declined all services with [facility] at this time. Liaison further offered care management services for a nurse care coordinator for a post hospital prevention readmission follow up call however pt and spouse have declined this services.  Spouse feels his can manage pt's care at this time with the involved [facility] agency. Spouse aware [facility] is for short term care in the home. Based upon the declined for [facility] and community care management post discharged informed primary caregiver that pt can reach out to the primary provider for a referral for community care  management services if needed in the further (receptive to the information provided). No other request or inquires at this time.    [facility] Care Management/Population Health does not replace or interfere with any arrangements made by the Inpatient Transition of Care team.   For questions contact:   Elliot Cousin, RN, [facility] Liaison [place]   Population Health Office Hours MTWF  8:00 am-6:00 pm [phone] mobile [phone] [Office toll free line] Office Hours are M-F 8:30 - 5 pm [email or web address]

## 2023-01-22 DIAGNOSIS — R112 Nausea with vomiting, unspecified: Secondary | ICD-10-CM | POA: Diagnosis not present

## 2023-01-22 LAB — RENAL FUNCTION PANEL
Albumin: 2.5 g/dL — ABNORMAL LOW (ref 3.5–5.0)
Anion gap: 13 (ref 5–15)
BUN: 45 mg/dL — ABNORMAL HIGH (ref 8–23)
CO2: 22 mmol/L (ref 22–32)
Calcium: 8.6 mg/dL — ABNORMAL LOW (ref 8.9–10.3)
Chloride: 98 mmol/L (ref 98–111)
Creatinine, Ser: 4.42 mg/dL — ABNORMAL HIGH (ref 0.44–1.00)
GFR, Estimated: 10 mL/min — ABNORMAL LOW (ref >60)
Glucose, Bld: 248 mg/dL — ABNORMAL HIGH (ref 70–99)
Phosphorus: 4.6 mg/dL (ref 2.5–4.6)
Potassium: 4.1 mmol/L (ref 3.5–5.1)
Sodium: 133 mmol/L — ABNORMAL LOW (ref 135–145)

## 2023-01-22 LAB — GLUCOSE, CAPILLARY
Glucose-Capillary: 228 mg/dL — ABNORMAL HIGH (ref 70–99)
Glucose-Capillary: 234 mg/dL — ABNORMAL HIGH (ref 70–99)
Glucose-Capillary: 220 mg/dL — ABNORMAL HIGH (ref 70–99)
Glucose-Capillary: 173 mg/dL — ABNORMAL HIGH (ref 70–99)

## 2023-01-22 LAB — MAGNESIUM: Magnesium: 1.6 mg/dL — ABNORMAL LOW (ref 1.7–2.4)

## 2023-01-22 MED ORDER — HEPARIN SODIUM (PORCINE) 1000 UNIT/ML IJ SOLN
3800.0000 [IU] | Freq: Once | INTRAMUSCULAR | Status: AC
  Administered 2023-01-22: 3800 [IU] via INTRAVENOUS
  Filled 2023-01-22: qty 4

## 2023-01-22 MED ORDER — ALTEPLASE 2 MG IJ SOLR: 2.0000 mg | Freq: Once | INTRAMUSCULAR | Status: DC | PRN

## 2023-01-22 MED ORDER — HEPARIN SODIUM (PORCINE) 1000 UNIT/ML DIALYSIS: 1000.0000 [IU] | INTRAMUSCULAR | Status: DC | PRN

## 2023-01-22 MED ORDER — LIDOCAINE-PRILOCAINE 2.5-2.5 % EX CREA: 1.0000 | TOPICAL_CREAM | CUTANEOUS | Status: DC | PRN

## 2023-01-22 MED ORDER — NYSTATIN 100000 UNIT/ML MT SUSP
5.0000 mL | Freq: Four times a day (QID) | OROMUCOSAL | Status: DC
  Administered 2023-01-23 – 2023-02-02 (×26): 500000 [IU] via ORAL
  Filled 2023-01-22 (×32): qty 5

## 2023-01-22 MED ORDER — PENTAFLUOROPROP-TETRAFLUOROETH EX AERO: 1.0000 | INHALATION_SPRAY | CUTANEOUS | Status: DC | PRN

## 2023-01-22 MED ORDER — LIDOCAINE HCL (PF) 1 % IJ SOLN: 5.0000 mL | INTRAMUSCULAR | Status: DC | PRN

## 2023-01-22 MED ORDER — MIDODRINE HCL 5 MG PO TABS
10.0000 mg | ORAL_TABLET | Freq: Once | ORAL | Status: AC
  Administered 2023-01-22: 10 mg via ORAL
  Filled 2023-01-22: qty 2

## 2023-01-22 MED ORDER — ANTICOAGULANT SODIUM CITRATE 4% (200MG/5ML) IV SOLN: 5.0000 mL | Status: DC | PRN

## 2023-01-22 NOTE — Care Management Important Message (Signed)
Important Message  Patient Details  Name: Susan [name] MRN: [id number] Date of Birth: 02-28-1951   Medicare Important Message Given:  Yes     Rukia Mcgillivray 01/22/2023, 3:01 PM

## 2023-01-22 NOTE — Progress Notes (Signed)
Subjective: Persistent nausea and vomiting.  Objective: Vital signs in last 24 hours: Temp:  [97.5 F (36.4 C)-98.2 F (36.8 C)] 98.2 F (36.8 C) (08/29 1434) Pulse Rate:  [70-94] 74 (08/29 1730) Resp:  [10-18] 10 (08/29 1730) BP: (99-177)/(58-86) 99/61 (08/29 1730) SpO2:  [99 %-100 %] 100 % (08/29 1730) Weight:  [78.7 kg] 78.7 kg (08/29 1434) Last BM Date : 01/22/23  Intake/Output from previous day: 08/28 0701 - 08/29 0700 In: 573.9 [P.O.:360; I.V.:163.9; IV Piggyback:50] Out: 650 [Urine:300; Emesis/NG output:350] Intake/Output this shift: No intake/output data recorded.  General appearance: alert and moderate distress GI: soft, non-tender; bowel sounds normal; no masses,  no organomegaly  Lab Results: Recent Labs    01/20/23 0302 01/21/23 0456  WBC 10.5 7.3  HGB 11.2* 10.3*  HCT 37.4 34.6*  PLT 338 285   BMET Recent Labs    01/20/23 0302 01/21/23 0456 01/22/23 0850  NA 132* 132* 133*  K 4.1 3.3* 4.1  CL 92* 96* 98  CO2 21* 27 22  GLUCOSE 309* 58* 248*  BUN 60* 28* 45*  CREATININE 5.12* 3.40* 4.42*  CALCIUM 9.8 8.5* 8.6*   LFT Recent Labs    01/21/23 0456 01/22/23 0850  PROT 5.9*  --   ALBUMIN 2.5* 2.5*  AST 13*  --   ALT 14  --   ALKPHOS 177*  --   BILITOT 0.4  --    PT/INR No results for input(s): "LABPROT", "INR" in the last 72 hours. Hepatitis Panel Recent Labs    01/20/23 0302  HEPBSAG NON REACTIVE   C-Diff No results for input(s): "CDIFFTOX" in the last 72 hours. Fecal Lactopherrin No results for input(s): "FECLLACTOFRN" in the last 72 hours.  Studies/Results: No results found.  Medications: Scheduled:  apixaban  5 mg Oral BID   atorvastatin  80 mg Oral Daily   calcitRIOL  0.75 mcg Oral Daily   carvedilol  6.25 mg Oral BID WC   Chlorhexidine Gluconate Cloth  6 each Topical Q0600   Chlorhexidine Gluconate Cloth  6 each Topical Q0600   clonazePAM  0.5 mg Oral BID   folic acid  1 mg Oral Daily   insulin aspart  0-9 Units  Subcutaneous TID WC   insulin glargine-yfgn  6 Units Subcutaneous QHS   levothyroxine  75 mcg Oral QAC breakfast   nystatin  5 mL Oral QID   pantoprazole (PROTONIX) IV  40 mg Intravenous Q24H   sevelamer carbonate  1,600 mg Oral TID WC   sucralfate  1 g Oral TID WC & HS   Continuous:  anticoagulant sodium citrate      Assessment/Plan: 1) Persistent nausea and vomiting. 2) History of a Candidal esophagitis. 3) QTc prolongation.   The patient had a Candidal esophagitis in April this year.  Her symptoms at that time were nausea, vomiting, and abdominal pain.  After starting Nystatin it appears that she improved.  It is prudent to retry her on Nystatin.  Plan: 1) Nystatin. 2) Continue with phenergan for now.  LOS: 2 days   Osbaldo Mark D 01/22/2023, 5:31 PM

## 2023-01-22 NOTE — Progress Notes (Signed)
Pt returned to room 5C-07 from Dialysis.

## 2023-01-22 NOTE — Plan of Care (Signed)
  Problem: Fluid Volume: Goal: Ability to achieve a balanced intake and output will improve Outcome: Progressing   Problem: Education: Goal: Ability to describe self-care measures that may prevent or decrease complications (Diabetes Survival Skills Education) will improve Outcome: Progressing Goal: Individualized Educational Video(s) Outcome: Progressing

## 2023-01-22 NOTE — Progress Notes (Signed)
[facility] Progress Note  Name: Susan [name] MRN: [id number] DOB: 1951/03/05  Chief Complaint:  N/v  Subjective:  Last HD on 8/27 with 3 kg UF.  Spoke with her primary team.  She has been on NS at 50 ml/hr yesterday afternoon and overnight for clinical concern for fluid depletion in the interim.  She has been on ativan for nausea due to prolonged QTc.  She is sleepy but does answer questions this AM.  Primary team has just left.   Review of systems:  Nausea is a little better today  Sleepy - on ativan  She states shortness of breath is better today; denies chest pain  --------------  Background on consult:   Susan [name] is a 72 y.o. female with a history of ESRD on hemodialysis Tuesday Thursday Saturday, coronary artery disease, chronic diastolic CHF, hypertension, paroxysmal atrial fibrillation, and type 2 diabetes mellitus who presented to the hospital with uncontrolled nausea and vomiting.  The evaluating provider in the ER is concerned about gastroparesis or possible cholecystitis.  She had gallstones on imaging and was tender right upper quadrant.  Nephrology is consulted for assistance with management of hemodialysis.  Note that she had a recent admission on 8/22 - 8/24 for proctitis and uncontrolled diabetes.  At that time her dry weight was lowered to 79 kg from 83 kg.  She states that she went to HD outpatient on Saturday - cannot open on two browsers.  She states blood sugars are 200-300 but states that this is better than the "700's before".      Intake/Output Summary (Last 24 hours) at 01/22/2023 0830 Last data filed at 01/22/2023 0454 Gross per 24 hour  Intake 573.91 ml  Output 650 ml  Net -76.09 ml    Vitals:  Vitals:   01/21/23 1633 01/21/23 1651 01/21/23 2051 01/22/23 0454  BP: (!) 163/78  (!) 177/86 138/62  Pulse: 88 80 94 84  Resp: 18  16 16   Temp: 97.7 F (36.5 C)  (!) 97.5 F (36.4 C)   TempSrc: Oral  Oral   SpO2: 100% 99% 100% 99%   Weight:         Physical Exam:   General:  adult female in bed in NAD HEENT: NCAT  Eyes: EOMI sclera anicteric Neck: Supple trachea midline  Heart: S1S2 no rub Lungs: clear but reduced on 3 liters oxygen  Abdomen: soft/distended abdomen - obese habitus  Extremities:  right BKA; no pitting edema left leg. Poor skin turgor left leg Neuro: sleepy but answers questions appropriately; follows commands  Access RIJ tunn catheter   Medications reviewed   Labs:     Latest Ref Rng & Units 01/21/2023    4:56 AM 01/20/2023    3:02 AM 01/17/2023    4:27 AM  BMP  Glucose 70 - 99 mg/dL 58  962  952   BUN 8 - 23 mg/dL 28  60  52   Creatinine 0.44 - 1.00 mg/dL 8.41  3.24  4.01   Sodium 135 - 145 mmol/L 132  132  127   Potassium 3.5 - 5.1 mmol/L 3.3  4.1  3.9   Chloride 98 - 111 mmol/L 96  92  91   CO2 22 - 32 mmol/L 27  21  21    Calcium 8.9 - 10.3 mg/dL 8.5  9.8  8.3    Outpatient HD unit:  TTS [facility]  4h  400/800   2/2 bath   [facility] TDC  Heparin 4000 + 2000 midrun  EDW was just lowered to 79 kg  - rocaltrol 0.75 mcg po three times per week - mircera 120 mcg  IV q 4 wks - last mircera given 8/10 (not given after discharge)  Just discharged 01/17/23   Assessment/Plan:   # Intractable Nausea/vomiting  - per primary team    # ESRD - HD per TTS schedule  - will follow up today's labs  - discontinued fluids for now  - order is in for daily weights   # HTN and volume   - Anasarca noted on prior admission, improving per exam  - optimize volume status with HD  - note that she reports that she takes midodrine prior to outpatient HD and then was instructed to bring a dose to the clinic in case she needs another dose - she has a PRN dose available here but we are not scheduling it right now      # Hyponatremia  - resolving with HD/UF   # Anemia of CKD  - Watch for need for ESA - was not given outpatient (last rec'd 8/10)   # Metabolic bone disease  - Continue outpatient regimen when  tolerating PO.  Appears to be on renvela  - continue calcitriol 0.75 mcg three times a week  Disposition - per primary team    Estanislado Emms, MD 01/22/2023 8:49 AM

## 2023-01-22 NOTE — Progress Notes (Signed)
Received patient in bed. Awake,alert and oriented x 2,persons and place.Consent verified.  Accessed used: Right HD catheter that worked well.Dressing change done today.  Medicines given: Midodrine 10 mg.                             Compazine 5 mg.  Duration of treatment : 3.5 hours.  Fluid removed : 3.5 out of 4 liters prescribed.  Hemo comment: At 4 liters uf goal,her blood pressures were started dropping on her last hour of treatment ,plus patient at this time was complaining of difficulty of breathing ,though her oxygen saturation and breathing pattern were normal,no chest pain.hold UF at this time.Last 10 minutes of her treatment ,she was having moderate cramping on both legs.  Send back to her room with with good vitals signs. No complain at this time.                               Hand off to the patient's nurse.

## 2023-01-22 NOTE — Progress Notes (Signed)
PROGRESS NOTE    Susan [name]  NAT:[id number] DOB: 06/08/1950 DOA: 01/20/2023 PCP: Donita Brooks, MD    Brief Narrative:  72 year old chronically sick lady with history of ESRD on hemodialysis TTS schedule, peripheral vascular disease status post right BKA, recent left wound debridement and skin grafting by Dr. 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  In the emergency room afebrile.  WBC count normal.  Glucose 309.  Upper quadrant ultrasound with cholelithiasis without evidence of acute cholecystitis.  CT scan consistent with pulmonary vascular congestion, interstitial prominence, small bilateral pleural effusion.  EKG with right bundle branch block, sinus rhythm, QTc 540.  Due to persistent nausea vomiting, she was given Ativan, Zofran and promethazine.  Patient remains persistently nauseated and intolerance to oral intake hence remains in the hospital.  Followed by GI.   Assessment & Plan:   Intractable nausea and vomiting with recent episode of colitis: Persistent symptoms.  CT scan with evidence of improving colitis.  Possible gastroparesis or remaining symptoms from previous episode.  Patient with prolonged QTc, limitation on using antiemetics. Currently on PPI, Ativan as needed, occasional use of Compazine.  Followed by GI.  Recommended conservative management.  Clinically dehydrated, overnight on low-dose IV fluids.  Will discontinue. Continue clear liquid diet until clinical improvement and tolerance. CT scan, right upper quadrant ultrasound without any acute findings.  Chronic systolic congestive heart failure: Managed with hemodialysis.  Nephrology following.  Will stop further fluids.  For hemodialysis today.  Chronic medical issues  including Paroxysmal A-fib, currently in sinus rhythm.  On Coreg and Eliquis. Coronary artery disease, status post stent.  Stable. Patient is hypertensive, blood pressure stable. Anemia of chronic disease, hemoglobin stable Hypothyroidism, on thyroxine and is stable.  Type 2 diabetes, well-controlled.  Currently with poor appetite.  On low-dose long-acting insulin and low-dose sliding scale insulin.  Sugars are elevated but she has no reliable intake.  Will allow hyperglycemia to avoid dropping blood sugars.  Peripheral vascular disease Status post right BKA.  Stable. Status post debridement and fish skin grafting left leg: Local wound care.  Dry dressing.  Prolonged QTc: Unable to use multiple nausea medications.  Chronic stable prolonged QTc.  Among medications, Compazine reported to be least problematic with prolonged QTc.  Will use very limited quantities.  Recheck EKG today.  Keep on telemetry monitor.  DVT prophylaxis:  apixaban (ELIQUIS) tablet 5 mg   Code Status: Full code Family Communication: None at bedside in the morning rounds Disposition Plan: Status is: Inpatient Remains inpatient appropriate because: Unable to eat, persistent nausea.     Consultants:  Nephrology GI  Procedures:  None  Antimicrobials:  None   Subjective:  Patient seen and examined.  Patient tells me her nausea is slightly better today.  Sleepy and tired.  She did use Compazine overnight.  Objective: Vitals:   01/21/23 1651 01/21/23 2051 01/22/23 0454 01/22/23 0911  BP:  (!) 177/86 138/62 (!) 123/59  Pulse: 80 94 84 71  Resp:  16 16 18   Temp:  (!) 97.5 F (36.4 C)  97.7 F (36.5 C)  TempSrc:  Oral  Oral  SpO2: 99% 100% 99% 100%  Weight:        Intake/Output Summary (Last 24 hours) at 01/22/2023 1141 Last data filed at 01/22/2023 0454 Gross per 24 hour  Intake 453.91 ml  Output 550 ml  Net -96.09 ml   Filed Weights   01/20/23 0300 01/20/23 1352 01/20/23 1749  Weight: 80 kg  84.7 kg 81.7 kg    Examination:  General exam: Chronically sick looking.  Very frail and debilitated.  On room air.  Not in any distress. Respiratory system: No added sounds. Cardiovascular system: S1 & S2 heard, RRR.  Gastrointestinal system: Abdomen is nondistended, soft and nontender. No organomegaly or masses felt. Normal bowel sounds heard. Central nervous system: Alert and oriented. No focal neurological deficits. Extremities: Symmetric 5 x 5 power. Skin:  Right BKA stump clean and dry. Left leg poor peripheral vascular status.  Dry dressing present.    Data Reviewed: I have personally reviewed following labs and imaging studies  CBC: Recent Labs  Lab 01/16/23 0404 01/17/23 0427 01/20/23 0302 01/21/23 0456  WBC 10.4 7.0 10.5 7.3  HGB 10.1* 9.2* 11.2* 10.3*  HCT 33.1* 29.2* 37.4 34.6*  MCV 75.6* 74.1* 74.2* 74.4*  PLT 252 221 338 285   Basic Metabolic Panel: Recent Labs  Lab 01/15/23 1514 01/16/23 0404 01/17/23 0427 01/20/23 0302 01/21/23 0456 01/22/23 0850  NA 127* 133* 127* 132* 132* 133*  K 3.7 3.4* 3.9 4.1 3.3* 4.1  CL 90* 95* 91* 92* 96* 98  CO2 19* 28 21* 21* 27 22  GLUCOSE 407* 170* 177* 309* 58* 248*  BUN 57* 28* 52* 60* 28* 45*  CREATININE 4.30* 2.70* 4.08* 5.12* 3.40* 4.42*  CALCIUM 8.9 8.7* 8.3* 9.8 8.5* 8.6*  PHOS 5.2* 3.4 4.8*  --   --  4.6   GFR: Estimated Creatinine Clearance: 12 mL/min (A) (by C-G formula based on SCr of 4.42 mg/dL (H)). Liver Function Tests: Recent Labs  Lab 01/16/23 0404 01/17/23 0427 01/20/23 0302 01/21/23 0456 01/22/23 0850  AST  --   --  15 13*  --   ALT  --   --  18 14  --   ALKPHOS  --   --  243* 177*  --   BILITOT  --   --  0.5 0.4  --   PROT  --   --  7.4 5.9*  --   ALBUMIN 2.4* 2.4* 3.2* 2.5* 2.5*   Recent Labs  Lab 01/20/23 0302  LIPASE 31   No results for input(s): "AMMONIA" in the last 168 hours. Coagulation Profile: No results for input(s): "INR", "PROTIME" in the last 168 hours. Cardiac  Enzymes: No results for input(s): "CKTOTAL", "CKMB", "CKMBINDEX", "TROPONINI" in the last 168 hours. BNP (last 3 results) No results for input(s): "PROBNP" in the last 8760 hours. HbA1C: No results for input(s): "HGBA1C" in the last 72 hours. CBG: Recent Labs  Lab 01/21/23 1623 01/21/23 2052 01/22/23 0144 01/22/23 0713 01/22/23 1122  GLUCAP 203* 217* 228* 234* 220*   Lipid Profile: No results for input(s): "CHOL", "HDL", "LDLCALC", "TRIG", "CHOLHDL", "LDLDIRECT" in the last 72 hours. Thyroid Function Tests: No results for input(s): "TSH", "T4TOTAL", "FREET4", "T3FREE", "THYROIDAB" in the last 72 hours. Anemia Panel: No results for input(s): "VITAMINB12", "FOLATE", "FERRITIN", "TIBC", "IRON", "RETICCTPCT" in the last 72 hours. Sepsis Labs: No results for input(s): "PROCALCITON", "LATICACIDVEN" in the last 168 hours.  Recent Results (from the past 240 hour(s))  MRSA Next Gen by PCR, Nasal     Status: None   Collection Time: 01/20/23  6:56 PM   Specimen: Nasal Mucosa; Nasal Swab  Result Value Ref Range Status   MRSA by PCR Next Gen NOT DETECTED NOT  DETECTED Final    Comment: (NOTE) The GeneXpert MRSA Assay (FDA approved for NASAL specimens only), is one component of a comprehensive MRSA colonization surveillance program. It is not intended to diagnose MRSA infection nor to guide or monitor treatment for MRSA infections. Test performance is not FDA approved in patients less than 91 years old. Performed at [facility], [address], [place], [place] [address]          Radiology Studies: No results found.      Scheduled Meds:  apixaban  5 mg Oral BID   atorvastatin  80 mg Oral Daily   calcitRIOL  0.75 mcg Oral Daily   carvedilol  6.25 mg Oral BID WC   Chlorhexidine Gluconate Cloth  6 each Topical Q0600   Chlorhexidine Gluconate Cloth  6 each Topical Q0600   clonazePAM  0.5 mg Oral BID   folic acid  1 mg Oral Daily   insulin aspart  0-9 Units  Subcutaneous TID WC   insulin glargine-yfgn  6 Units Subcutaneous QHS   levothyroxine  75 mcg Oral QAC breakfast   midodrine  10 mg Oral Once in dialysis   pantoprazole (PROTONIX) IV  40 mg Intravenous Q24H   sevelamer carbonate  1,600 mg Oral TID WC   sucralfate  1 g Oral TID WC & HS   Continuous Infusions:     LOS: 2 days    Time spent: 35 minutes    [name], MD [facility]

## 2023-01-23 ENCOUNTER — Inpatient Hospital Stay: Payer: PPO | Admitting: Family Medicine

## 2023-01-23 DIAGNOSIS — R112 Nausea with vomiting, unspecified: Secondary | ICD-10-CM | POA: Diagnosis not present

## 2023-01-23 LAB — RENAL FUNCTION PANEL
Albumin: 2.6 g/dL — ABNORMAL LOW (ref 3.5–5.0)
Anion gap: 12 (ref 5–15)
BUN: 30 mg/dL — ABNORMAL HIGH (ref 8–23)
CO2: 23 mmol/L (ref 22–32)
Calcium: 8.4 mg/dL — ABNORMAL LOW (ref 8.9–10.3)
Chloride: 96 mmol/L — ABNORMAL LOW (ref 98–111)
Creatinine, Ser: 3.71 mg/dL — ABNORMAL HIGH (ref 0.44–1.00)
GFR, Estimated: 12 mL/min — ABNORMAL LOW (ref >60)
Glucose, Bld: 244 mg/dL — ABNORMAL HIGH (ref 70–99)
Phosphorus: 4.6 mg/dL (ref 2.5–4.6)
Potassium: 3.6 mmol/L (ref 3.5–5.1)
Sodium: 131 mmol/L — ABNORMAL LOW (ref 135–145)

## 2023-01-23 LAB — GLUCOSE, CAPILLARY
Glucose-Capillary: 160 mg/dL — ABNORMAL HIGH (ref 70–99)
Glucose-Capillary: 226 mg/dL — ABNORMAL HIGH (ref 70–99)
Glucose-Capillary: 244 mg/dL — ABNORMAL HIGH (ref 70–99)
Glucose-Capillary: 354 mg/dL — ABNORMAL HIGH (ref 70–99)

## 2023-01-23 MED ORDER — PROCHLORPERAZINE EDISYLATE 10 MG/2ML IJ SOLN
5.0000 mg | Freq: Once | INTRAMUSCULAR | Status: AC
  Administered 2023-01-24: 5 mg via INTRAVENOUS
  Filled 2023-01-23: qty 2

## 2023-01-23 MED ORDER — SODIUM CHLORIDE 0.9 % IV BOLUS
250.0000 mL | Freq: Once | INTRAVENOUS | Status: AC
  Administered 2023-01-23: 250 mL via INTRAVENOUS

## 2023-01-23 MED ORDER — INSULIN ASPART 100 UNIT/ML IJ SOLN
0.0000 [IU] | Freq: Three times a day (TID) | INTRAMUSCULAR | Status: DC
  Administered 2023-01-24: 9 [IU] via SUBCUTANEOUS
  Administered 2023-01-24: 2 [IU] via SUBCUTANEOUS
  Administered 2023-01-25: 9 [IU] via SUBCUTANEOUS
  Administered 2023-01-25: 7 [IU] via SUBCUTANEOUS
  Administered 2023-01-25: 5 [IU] via SUBCUTANEOUS
  Administered 2023-01-26: 2 [IU] via SUBCUTANEOUS
  Administered 2023-01-26: 7 [IU] via SUBCUTANEOUS
  Administered 2023-01-26: 2 [IU] via SUBCUTANEOUS
  Administered 2023-01-27: 1 [IU] via SUBCUTANEOUS
  Administered 2023-01-27: 3 [IU] via SUBCUTANEOUS
  Administered 2023-01-27: 5 [IU] via SUBCUTANEOUS
  Administered 2023-01-28 (×2): 7 [IU] via SUBCUTANEOUS
  Administered 2023-01-28 – 2023-01-29 (×2): 9 [IU] via SUBCUTANEOUS

## 2023-01-23 MED ORDER — MAGNESIUM SULFATE IN D5W 1-5 GM/100ML-% IV SOLN
1.0000 g | Freq: Once | INTRAVENOUS | Status: AC
  Administered 2023-01-24: 1 g via INTRAVENOUS
  Filled 2023-01-23: qty 100

## 2023-01-23 MED ORDER — INSULIN ASPART 100 UNIT/ML IJ SOLN
0.0000 [IU] | Freq: Every day | INTRAMUSCULAR | Status: DC
  Administered 2023-01-23: 5 [IU] via SUBCUTANEOUS
  Administered 2023-01-24: 4 [IU] via SUBCUTANEOUS
  Administered 2023-01-25: 2 [IU] via SUBCUTANEOUS
  Administered 2023-01-26: 5 [IU] via SUBCUTANEOUS
  Administered 2023-01-27: 3 [IU] via SUBCUTANEOUS
  Administered 2023-01-28: 5 [IU] via SUBCUTANEOUS

## 2023-01-23 MED ORDER — CHLORHEXIDINE GLUCONATE CLOTH 2 % EX PADS
6.0000 | MEDICATED_PAD | Freq: Every day | CUTANEOUS | Status: DC
  Administered 2023-01-24 – 2023-01-31 (×8): 6 via TOPICAL

## 2023-01-23 MED ORDER — MAGNESIUM SULFATE 2 GM/50ML IV SOLN: 2.0000 g | Freq: Once | INTRAVENOUS | Status: DC

## 2023-01-23 NOTE — Progress Notes (Signed)
[facility] Progress Note  Name: Susan [name] MRN: [id number] DOB: 09-Feb-1951  Chief Complaint:  N/v  Subjective:  Last HD on 8/29 with 3.5 kg UF.  She feels dizzy.  She states that she was supposed to have a fistulogram today outpatient but was admitted.  States that her access has been blocked for several weeks and she has been admitted so hasn't been able to be seen for the same.      Review of systems:    Nausea again today  She just had diarrhea and states that just got her cleaned up  She states a little shortness of breath; denies chest pain  --------------  Background on consult:   Susan [name] is a 72 y.o. female with a history of ESRD on hemodialysis Tuesday Thursday Saturday, coronary artery disease, chronic diastolic CHF, hypertension, paroxysmal atrial fibrillation, and type 2 diabetes mellitus who presented to the hospital with uncontrolled nausea and vomiting.  The evaluating provider in the ER is concerned about gastroparesis or possible cholecystitis.  She had gallstones on imaging and was tender right upper quadrant.  Nephrology is consulted for assistance with management of hemodialysis.  Note that she had a recent admission on 8/22 - 8/24 for proctitis and uncontrolled diabetes.  At that time her dry weight was lowered to 79 kg from 83 kg.  She states that she went to HD outpatient on Saturday - cannot open on two browsers.  She states blood sugars are 200-300 but states that this is better than the "700's before".      Intake/Output Summary (Last 24 hours) at 01/23/2023 1103 Last data filed at 01/23/2023 1056 Gross per 24 hour  Intake 480 ml  Output 3.5 ml  Net 476.5 ml    Vitals:  Vitals:   01/22/23 1833 01/22/23 2047 01/23/23 0516 01/23/23 0847  BP: 128/67 (!) 156/65 (!) 99/45 (!) 118/59  Pulse: 77 77 66 69  Resp: 14 16 16 15   Temp: 98 F (36.7 C) 97.9 F (36.6 C)  97.8 F (36.6 C)  TempSrc: Oral Oral  Oral  SpO2: 100% 100% 100% 100%   Weight: 75.3 kg        Physical Exam:    General:  adult female in bed in NAD HEENT: NCAT  Eyes: EOMI sclera anicteric Neck: Supple trachea midline  Heart: S1S2 no rub Lungs: clear but reduced on 3 liters oxygen  Abdomen: soft/distended abdomen - obese habitus  Extremities:  right BKA; no pitting edema left leg.  Neuro: alert and oriented x 3 provides a history and follows commands Access RIJ tunn catheter; she has a left arm access with no thrill or bruit  Medications reviewed   Labs:     Latest Ref Rng & Units 01/22/2023    8:50 AM 01/21/2023    4:56 AM 01/20/2023    3:02 AM  BMP  Glucose 70 - 99 mg/dL 010  58  272   BUN 8 - 23 mg/dL 45  28  60   Creatinine 0.44 - 1.00 mg/dL 5.36  6.44  0.34   Sodium 135 - 145 mmol/L 133  132  132   Potassium 3.5 - 5.1 mmol/L 4.1  3.3  4.1   Chloride 98 - 111 mmol/L 98  96  92   CO2 22 - 32 mmol/L 22  27  21    Calcium 8.9 - 10.3 mg/dL 8.6  8.5  9.8    Outpatient HD unit:  TTS [facility]  4h  400/800   2/2 bath   [facility] TDC   Heparin 4000 + 2000 midrun  EDW was just lowered to 79 kg  - rocaltrol 0.75 mcg po three times per week - mircera 120 mcg  IV q 4 wks - last mircera given 8/10 (not given after discharge)  Just discharged 01/17/23   Assessment/Plan:   # Intractable Nausea/vomiting  - per primary team    # ESRD - HD per TTS schedule  - order is in for daily weights - will follow AM labs - if she remains early next week we will plan for an inpatient fistulogram for her left arm AVF    # HTN and volume   - Anasarca noted on prior admission, improving per exam  - optimize volume status with HD  - note that she reports that she takes midodrine prior to outpatient HD and then was instructed to bring a dose to the clinic in case she needs another dose - we have given midodrine PRN here  - she is dizzy and has had n/v worse after HD - will give NS 250 mL IV once today   # Hyponatremia  - resolving with HD/UF   # Anemia of CKD  -  Watch for need for ESA - last dose rec'd 8/10   # Metabolic bone disease  - Continue outpatient regimen when tolerating PO.  Appears to be on renvela  - continue calcitriol 0.75 mcg three times a week  Disposition - per primary team    Estanislado Emms, MD 01/23/2023 11:24 AM

## 2023-01-23 NOTE — Progress Notes (Signed)
Subjective: No vomiting, but she is still nauseous.  Objective: Vital signs in last 24 hours: Temp:  [97.8 F (36.6 C)-98.2 F (36.8 C)] 97.8 F (36.6 C) (08/30 0847) Pulse Rate:  [66-79] 69 (08/30 0847) Resp:  [10-18] 15 (08/30 0847) BP: (98-156)/(45-76) 118/59 (08/30 0847) SpO2:  [100 %] 100 % (08/30 0847) Weight:  [75.3 kg-78.7 kg] 75.3 kg (08/29 1833) Last BM Date : 01/22/23  Intake/Output from previous day: 08/29 0701 - 08/30 0700 In: 0  Out: 3.5  Intake/Output this shift: Total I/O In: 480 [P.O.:480] Out: 2 [Urine:1; Stool:1]  General appearance: alert and no distress GI: soft, non-tender; bowel sounds normal; no masses,  no organomegaly  Lab Results: Recent Labs    01/21/23 0456  WBC 7.3  HGB 10.3*  HCT 34.6*  PLT 285   BMET Recent Labs    01/21/23 0456 01/22/23 0850  NA 132* 133*  K 3.3* 4.1  CL 96* 98  CO2 27 22  GLUCOSE 58* 248*  BUN 28* 45*  CREATININE 3.40* 4.42*  CALCIUM 8.5* 8.6*   LFT Recent Labs    01/21/23 0456 01/22/23 0850  PROT 5.9*  --   ALBUMIN 2.5* 2.5*  AST 13*  --   ALT 14  --   ALKPHOS 177*  --   BILITOT 0.4  --    PT/INR No results for input(s): "LABPROT", "INR" in the last 72 hours. Hepatitis Panel No results for input(s): "HEPBSAG", "HCVAB", "HEPAIGM", "HEPBIGM" in the last 72 hours. C-Diff No results for input(s): "CDIFFTOX" in the last 72 hours. Fecal Lactopherrin No results for input(s): "FECLLACTOFRN" in the last 72 hours.  Studies/Results: No results found.  Medications: Scheduled:  apixaban  5 mg Oral BID   atorvastatin  80 mg Oral Daily   calcitRIOL  0.75 mcg Oral Daily   carvedilol  6.25 mg Oral BID WC   Chlorhexidine Gluconate Cloth  6 each Topical Q0600   Chlorhexidine Gluconate Cloth  6 each Topical Q0600   clonazePAM  0.5 mg Oral BID   folic acid  1 mg Oral Daily   insulin aspart  0-9 Units Subcutaneous TID WC   insulin glargine-yfgn  6 Units Subcutaneous QHS   levothyroxine  75 mcg Oral QAC  breakfast   nystatin  5 mL Oral QID   pantoprazole (PROTONIX) IV  40 mg Intravenous Q24H   sevelamer carbonate  1,600 mg Oral TID WC   sucralfate  1 g Oral TID WC & HS   Continuous:  Assessment/Plan: 1) Nausea. 2) History of Candidal esophagitis with resultant nausea and vomiting. 3) ESRD. 4) CHF.   Clinically she appears better.  It may be that the nystatin is helping.  Plan: 1) Maintain nystatin for a total of 14 days. 2) [facility] will follow up this weekend.  LOS: 3 days   Yanis Larin D 01/23/2023, 12:36 PM

## 2023-01-23 NOTE — Progress Notes (Signed)
PROGRESS NOTE    Susan [name]  ZOX:[id number] DOB: 1950/09/20 DOA: 01/20/2023 PCP: Donita Brooks, MD    Brief Narrative:  72 year old chronically sick lady with history of ESRD on hemodialysis TTS schedule, peripheral vascular disease status post right BKA, recent left wound debridement and skin grafting by Dr. 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  In the emergency room afebrile.  WBC count normal.  Glucose 309.  Upper quadrant ultrasound with cholelithiasis without evidence of acute cholecystitis.  CT scan consistent with pulmonary vascular congestion, interstitial prominence, small bilateral pleural effusion.  EKG with right bundle branch block, sinus rhythm, QTc 540.  Due to persistent nausea vomiting, she was given Ativan, Zofran and promethazine.  Patient remains persistently nauseated and intolerance to oral intake hence remains in the hospital.  Followed by GI.   Assessment & Plan:   Intractable nausea and vomiting with recent episode of colitis: Persistent symptoms.  CT scan with evidence of improving colitis.  Possible gastroparesis or remaining symptoms from previous episode.  Patient with prolonged QTc, limitation on using antiemetics. Currently on PPI, Ativan as needed, occasional use of Compazine.  Followed by GI.  Recommended conservative management.  250 mL fluid today. Continue clear liquid diet until clinical improvement and tolerance. CT scan, right upper quadrant ultrasound without any acute findings.  Chronic systolic congestive heart failure: Managed with hemodialysis.  Nephrology following.  Chronic medical issues including Paroxysmal A-fib, currently in sinus rhythm.  On Coreg and Eliquis. Coronary artery disease, status post stent.   Stable. Hypertension, blood pressure stable. Anemia of chronic disease, hemoglobin stable Hypothyroidism, on thyroxine and is stable.  Type 2 diabetes, well-controlled.  Currently with poor appetite.  On low-dose long-acting insulin and low-dose sliding scale insulin.  Sugars are elevated but she has no reliable intake.  Will allow hyperglycemia to avoid dropping blood sugars.  Peripheral vascular disease Status post right BKA.  Stable. Status post debridement and fish skin grafting left leg: Local wound care.  Dry dressing.  Prolonged QTc: Unable to use multiple nausea medications.  Chronic stable prolonged QTc.  Among medications, Compazine reported to be least problematic with prolonged QTc.  Will use very limited quantities. Keep on telemetry monitor. Repeat EKG with stable QTc at 530.  DVT prophylaxis:  apixaban (ELIQUIS) tablet 5 mg   Code Status: Full code Family Communication: None at bedside in the morning rounds Disposition Plan: Status is: Inpatient Remains inpatient appropriate because: Unable to eat, persistent nausea.     Consultants:  Nephrology GI  Procedures:  None  Antimicrobials:  None   Subjective: Patient seen and examined.  She tells me she is still nauseated.  Denies any vomiting.  Hardly able to keep any liquids down.  Mild abdominal pain.  Objective: Vitals:   01/22/23 1833 01/22/23 2047 01/23/23 0516 01/23/23 0847  BP: 128/67 (!) 156/65 (!) 99/45 (!) 118/59  Pulse: 77 77 66 69  Resp: 14 16 16 15   Temp: 98 F (36.7 C) 97.9 F (36.6 C)  97.8 F (36.6 C)  TempSrc: Oral Oral  Oral  SpO2: 100% 100% 100% 100%  Weight: 75.3 kg       Intake/Output Summary (Last 24 hours) at 01/23/2023 1248 Last data filed at 01/23/2023 1156 Gross per 24 hour  Intake 480 ml  Output 5.5 ml  Net 474.5 ml   Filed Weights   01/20/23 1749 01/22/23 1434 01/22/23 1833  Weight: 81.7 kg 78.7 kg 75.3 kg    Examination:  General exam: Chronically sick looking.   Very frail and debilitated.  On room air.  Not in any distress. Mucous membranes are dry. Respiratory system: No added sounds. Cardiovascular system: S1 & S2 heard, RRR.  Gastrointestinal system: Abdomen is nondistended, soft and nontender. No organomegaly or masses felt. Normal bowel sounds heard. Central nervous system: Alert and oriented. No focal neurological deficits. Extremities: Symmetric 5 x 5 power. Skin:  Right BKA stump clean and dry. Left leg poor peripheral vascular status.  Dry dressing present.    Data Reviewed: I have personally reviewed following labs and imaging studies  CBC: Recent Labs  Lab 01/17/23 0427 01/20/23 0302 01/21/23 0456  WBC 7.0 10.5 7.3  HGB 9.2* 11.2* 10.3*  HCT 29.2* 37.4 34.6*  MCV 74.1* 74.2* 74.4*  PLT 221 338 285   Basic Metabolic Panel: Recent Labs  Lab 01/17/23 0427 01/20/23 0302 01/21/23 0456 01/22/23 0850 01/22/23 2126 01/23/23 1150  NA 127* 132* 132* 133*  --  131*  K 3.9 4.1 3.3* 4.1  --  3.6  CL 91* 92* 96* 98  --  96*  CO2 21* 21* 27 22  --  23  GLUCOSE 177* 309* 58* 248*  --  244*  BUN 52* 60* 28* 45*  --  30*  CREATININE 4.08* 5.12* 3.40* 4.42*  --  3.71*  CALCIUM 8.3* 9.8 8.5* 8.6*  --  8.4*  MG  --   --   --   --  1.6*  --   PHOS 4.8*  --   --  4.6  --  4.6   GFR: Estimated Creatinine Clearance: 13.7 mL/min (A) (by C-G formula based on SCr of 3.71 mg/dL (H)). Liver Function Tests: Recent Labs  Lab 01/17/23 0427 01/20/23 0302 01/21/23 0456 01/22/23 0850 01/23/23 1150  AST  --  15 13*  --   --   ALT  --  18 14  --   --   ALKPHOS  --  243* 177*  --   --   BILITOT  --  0.5 0.4  --   --   PROT  --  7.4 5.9*  --   --   ALBUMIN 2.4* 3.2* 2.5* 2.5* 2.6*   Recent Labs  Lab 01/20/23 0302  LIPASE 31   No results for input(s): "AMMONIA" in the last 168 hours. Coagulation Profile: No results for input(s): "INR", "PROTIME" in the last 168 hours. Cardiac Enzymes: No results for input(s): "CKTOTAL", "CKMB",  "CKMBINDEX", "TROPONINI" in the last 168 hours. BNP (last 3 results) No results for input(s): "PROBNP" in the last 8760 hours. HbA1C: No results for input(s): "HGBA1C" in the last 72 hours. CBG: Recent Labs  Lab 01/22/23 0713 01/22/23 1122 01/22/23 2048 01/23/23 0751 01/23/23 1149  GLUCAP 234* 220* 173* 160* 226*   Lipid Profile: No results for input(s): "CHOL", "HDL", "LDLCALC", "TRIG", "CHOLHDL", "LDLDIRECT" in the last 72 hours. Thyroid Function Tests: No results for input(s): "TSH", "T4TOTAL", "FREET4", "T3FREE", "THYROIDAB" in the last 72 hours. Anemia Panel: No results for input(s): "VITAMINB12", "FOLATE", "FERRITIN", "TIBC", "IRON", "RETICCTPCT" in the last 72 hours. Sepsis Labs: No results for input(s): "PROCALCITON", "LATICACIDVEN" in the last 168 hours.  Recent Results (from the past 240 hour(s))  MRSA Next Gen by PCR, Nasal     Status: None   Collection Time: 01/20/23  6:56 PM   Specimen: Nasal Mucosa; Nasal Swab  Result Value Ref Range Status   MRSA by PCR Next Gen NOT DETECTED NOT DETECTED Final    Comment: (NOTE) The GeneXpert MRSA Assay (FDA approved for NASAL specimens only), is one component of a comprehensive MRSA colonization surveillance program. It is not intended to diagnose MRSA infection nor to guide or monitor treatment for MRSA infections. Test performance is not FDA approved in patients less than 34 years old. Performed at [facility], [address], [place], [place] [address]          Radiology Studies: No results found.      Scheduled Meds:  apixaban  5 mg Oral BID   atorvastatin  80 mg Oral Daily   calcitRIOL  0.75 mcg Oral Daily   carvedilol  6.25 mg Oral BID WC   Chlorhexidine Gluconate Cloth  6 each Topical Q0600   Chlorhexidine Gluconate Cloth  6 each Topical Q0600   clonazePAM  0.5 mg Oral BID   folic acid  1 mg Oral Daily   insulin aspart  0-9 Units Subcutaneous TID WC   insulin glargine-yfgn  6 Units  Subcutaneous QHS   levothyroxine  75 mcg Oral QAC breakfast   nystatin  5 mL Oral QID   pantoprazole (PROTONIX) IV  40 mg Intravenous Q24H   sevelamer carbonate  1,600 mg Oral TID WC   sucralfate  1 g Oral TID WC & HS   Continuous Infusions:     LOS: 3 days    Time spent: 35 minutes    [name], MD [facility]

## 2023-01-24 DIAGNOSIS — N186 End stage renal disease: Secondary | ICD-10-CM | POA: Diagnosis not present

## 2023-01-24 DIAGNOSIS — R110 Nausea: Secondary | ICD-10-CM | POA: Diagnosis not present

## 2023-01-24 DIAGNOSIS — I120 Hypertensive chronic kidney disease with stage 5 chronic kidney disease or end stage renal disease: Secondary | ICD-10-CM | POA: Diagnosis not present

## 2023-01-24 LAB — CBC
HCT: 35.8 % — ABNORMAL LOW (ref 36.0–46.0)
Hemoglobin: 10.6 g/dL — ABNORMAL LOW (ref 12.0–15.0)
MCH: 23 pg — ABNORMAL LOW (ref 26.0–34.0)
MCHC: 29.6 g/dL — ABNORMAL LOW (ref 30.0–36.0)
MCV: 77.7 fL — ABNORMAL LOW (ref 80.0–100.0)
Platelets: 271 10*3/uL (ref 150–400)
RBC: 4.61 MIL/uL (ref 3.87–5.11)
RDW: 18.1 % — ABNORMAL HIGH (ref 11.5–15.5)
WBC: 7.6 10*3/uL (ref 4.0–10.5)
nRBC: 0 % (ref 0.0–0.2)

## 2023-01-24 LAB — BASIC METABOLIC PANEL
Anion gap: 17 — ABNORMAL HIGH (ref 5–15)
BUN: 36 mg/dL — ABNORMAL HIGH (ref 8–23)
CO2: 21 mmol/L — ABNORMAL LOW (ref 22–32)
Calcium: 8.9 mg/dL (ref 8.9–10.3)
Chloride: 91 mmol/L — ABNORMAL LOW (ref 98–111)
Creatinine, Ser: 4.57 mg/dL — ABNORMAL HIGH (ref 0.44–1.00)
GFR, Estimated: 10 mL/min — ABNORMAL LOW (ref >60)
Glucose, Bld: 360 mg/dL — ABNORMAL HIGH (ref 70–99)
Potassium: 4.2 mmol/L (ref 3.5–5.1)
Sodium: 129 mmol/L — ABNORMAL LOW (ref 135–145)

## 2023-01-24 LAB — GLUCOSE, CAPILLARY
Glucose-Capillary: 358 mg/dL — ABNORMAL HIGH (ref 70–99)
Glucose-Capillary: 110 mg/dL — ABNORMAL HIGH (ref 70–99)
Glucose-Capillary: 197 mg/dL — ABNORMAL HIGH (ref 70–99)
Glucose-Capillary: 326 mg/dL — ABNORMAL HIGH (ref 70–99)

## 2023-01-24 LAB — MAGNESIUM: Magnesium: 1.9 mg/dL (ref 1.7–2.4)

## 2023-01-24 MED ORDER — ANTICOAGULANT SODIUM CITRATE 4% (200MG/5ML) IV SOLN: 5.0000 mL | Status: DC | PRN

## 2023-01-24 MED ORDER — ALBUMIN HUMAN 25 % IV SOLN
25.0000 g | Freq: Once | INTRAVENOUS | Status: AC
  Administered 2023-01-24: 25 g via INTRAVENOUS
  Filled 2023-01-24: qty 100

## 2023-01-24 MED ORDER — HEPARIN SODIUM (PORCINE) 1000 UNIT/ML DIALYSIS: 1000.0000 [IU] | INTRAMUSCULAR | Status: DC | PRN

## 2023-01-24 MED ORDER — MIDODRINE HCL 5 MG PO TABS
10.0000 mg | ORAL_TABLET | Freq: Once | ORAL | Status: AC | PRN
  Administered 2023-01-24: 10 mg via ORAL
  Filled 2023-01-24: qty 2

## 2023-01-24 MED ORDER — LORAZEPAM 2 MG/ML IJ SOLN
0.5000 mg | Freq: Four times a day (QID) | INTRAMUSCULAR | Status: DC | PRN
  Administered 2023-01-24 – 2023-01-31 (×6): 0.5 mg via INTRAVENOUS
  Filled 2023-01-24 (×7): qty 1

## 2023-01-24 MED ORDER — PROCHLORPERAZINE EDISYLATE 10 MG/2ML IJ SOLN
5.0000 mg | Freq: Once | INTRAMUSCULAR | Status: AC
  Administered 2023-01-24: 5 mg via INTRAVENOUS
  Filled 2023-01-24: qty 2

## 2023-01-24 MED ORDER — ALTEPLASE 2 MG IJ SOLR: 2.0000 mg | Freq: Once | INTRAMUSCULAR | Status: DC | PRN

## 2023-01-24 MED ORDER — HEPARIN SODIUM (PORCINE) 1000 UNIT/ML IJ SOLN
INTRAMUSCULAR | Status: AC
  Administered 2023-01-24: 3800 [IU]
  Filled 2023-01-24: qty 4

## 2023-01-24 MED ORDER — SODIUM CHLORIDE 0.9 % IV BOLUS
250.0000 mL | Freq: Once | INTRAVENOUS | Status: AC
  Administered 2023-01-24: 250 mL via INTRAVENOUS

## 2023-01-24 MED ORDER — INSULIN GLARGINE-YFGN 100 UNIT/ML ~~LOC~~ SOLN
12.0000 [IU] | Freq: Every day | SUBCUTANEOUS | Status: DC
  Administered 2023-01-24: 12 [IU] via SUBCUTANEOUS
  Filled 2023-01-24 (×2): qty 0.12

## 2023-01-24 MED ORDER — SODIUM CHLORIDE 0.9 % IV SOLN
12.5000 mg | Freq: Four times a day (QID) | INTRAVENOUS | Status: AC | PRN
  Administered 2023-01-24 – 2023-01-29 (×4): 12.5 mg via INTRAVENOUS
  Filled 2023-01-24: qty 0.5
  Filled 2023-01-24: qty 12.5
  Filled 2023-01-24: qty 0.5
  Filled 2023-01-24: qty 12.5
  Filled 2023-01-24: qty 0.5

## 2023-01-24 MED ORDER — AMITRIPTYLINE HCL 50 MG PO TABS
25.0000 mg | ORAL_TABLET | Freq: Every day | ORAL | Status: DC
  Administered 2023-01-24 – 2023-01-27 (×4): 25 mg via ORAL
  Filled 2023-01-24 (×4): qty 1

## 2023-01-24 MED ORDER — VENLAFAXINE HCL ER 75 MG PO CP24
75.0000 mg | ORAL_CAPSULE | Freq: Every day | ORAL | Status: DC
  Administered 2023-01-24 – 2023-02-02 (×9): 75 mg via ORAL
  Filled 2023-01-24 (×11): qty 1

## 2023-01-24 NOTE — Procedures (Signed)
HD Note:  Some information was entered later than the data was gathered due to patient care needs. The stated time with the data is accurate.  Received patient in bed to unit.   Alert and oriented.   Informed consent signed and in chart.   Access used: Upper right chest HD catheter Access issues: None  Patient received medication for nausea upon arrival, see MAR.  Patient was slower to respond about 15 min later. Patient had hypotensive episode during the first hour of treatment.  200 ml NS bolus given.  Dr. Malen Gauze in person, ordered albumin See flowsheet.    TX duration: 3.5 hours  Alert, without acute distress.  Total UF removed: 800 ml  Hand-off given to patient's nurse.   Transported back to the room   Genavie Boettger L. Dareen Piano, RN [facility].

## 2023-01-24 NOTE — Plan of Care (Signed)
  Problem: Health Behavior/Discharge Planning: Goal: Ability to identify and utilize available resources and services will improve Outcome: Progressing Goal: Ability to manage health-related needs will improve Outcome: Progressing

## 2023-01-24 NOTE — Progress Notes (Addendum)
Attending physician's note   I have taken a history, reviewed the chart, and examined the patient. I performed a substantive portion of this encounter, including complete performance of at least one of the key components, in conjunction with the APP. I agree with the APP's note, impression, and recommendations with my edits.  Reports overall improving on current therapy.  Still with some nausea, and seems more so like reluctance to try advancing diet rather than intolerance.  Encouraged her to try advancing slowly as tolerated.  We are somewhat limited in antiemetics due to prolonged QTc.  Will continue with current therapy.  Will continue following periodically through the holiday weekend.  Johnetta Sloniker, DO, FACG [phone] office          Progress Note  Primary GI: Dr. Elnoria Howard  LOS: 4 days   Chief Complaint: Candida esophagitis   Subjective   Patient somnolent in HD.  Reports she tried some broth yesterday without difficulty.  She is nervous to try anything further than that as she is scared she will have nausea and vomiting and won't be able to keep it down.  Feels somewhat better compared to yesterday.   Objective   Vital signs in last 24 hours: Temp:  [97.5 F (36.4 C)-98.2 F (36.8 C)] 98 F (36.7 C) (08/31 0434) Pulse Rate:  [25-89] 25 (08/31 1000) Resp:  [9-23] 20 (08/31 1000) BP: (79-167)/(46-96) 114/51 (08/31 1000) SpO2:  [96 %-100 %] 99 % (08/31 1000) Last BM Date : 01/22/23 Last BM recorded by nurses in past 5 days Stool Type: Type 6 (Mushy consistency with ragged edges) (01/23/2023 11:24 PM)  General:   female in no acute distress  Heart:  Regular rate and rhythm; no murmurs Pulm: Clear anteriorly; no wheezing Abdomen: soft, nondistended, normal bowel sounds in all quadrants. Nontender without guarding. No organomegaly appreciated. Neurologic:  Alert and  oriented x4;  No focal deficits.  Psych:  Cooperative. Normal mood and affect.  Intake/Output  from previous day: 08/30 0701 - 08/31 0700 In: 1020.3 [P.O.:920; IV Piggyback:100.3] Out: 106 [Urine:103; Stool:3] Intake/Output this shift: No intake/output data recorded.  Studies/Results: No results found.  Lab Results: Recent Labs    01/24/23 0850  WBC 7.6  HGB 10.6*  HCT 35.8*  PLT 271   BMET Recent Labs    01/22/23 0850 01/23/23 1150 01/24/23 0559  NA 133* 131* 129*  K 4.1 3.6 4.2  CL 98 96* 91*  CO2 22 23 21*  GLUCOSE 248* 244* 360*  BUN 45* 30* 36*  CREATININE 4.42* 3.71* 4.57*  CALCIUM 8.6* 8.4* 8.9   LFT Recent Labs    01/23/23 1150  ALBUMIN 2.6*   PT/INR No results for input(s): "LABPROT", "INR" in the last 72 hours.   Scheduled Meds:  apixaban  5 mg Oral BID   atorvastatin  80 mg Oral Daily   calcitRIOL  0.75 mcg Oral Daily   carvedilol  6.25 mg Oral BID WC   Chlorhexidine Gluconate Cloth  6 each Topical Q0600   Chlorhexidine Gluconate Cloth  6 each Topical Q0600   Chlorhexidine Gluconate Cloth  6 each Topical Q0600   clonazePAM  0.5 mg Oral BID   folic acid  1 mg Oral Daily   heparin sodium (porcine)       insulin aspart  0-5 Units Subcutaneous QHS   insulin aspart  0-9 Units Subcutaneous TID WC   insulin glargine-yfgn  12 Units Subcutaneous QHS   levothyroxine  75 mcg  Oral QAC breakfast   nystatin  5 mL Oral QID   pantoprazole (PROTONIX) IV  40 mg Intravenous Q24H   sevelamer carbonate  1,600 mg Oral TID WC   sucralfate  1 g Oral TID WC & HS   Continuous Infusions:  anticoagulant sodium citrate       Impression/Plan:   Intractable nausea and vomiting History of candidiasis esophagitis April 2024 with similar symptoms and improved on nystatin - Continue nystatin for 14 days as advised by Dr. Elnoria Howard - Continue diet as tolerated - Continue supportive care  Susan [name]  01/24/2023, 10:19 AM

## 2023-01-24 NOTE — Progress Notes (Signed)
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  In the emergency room afebrile.  WBC count normal.  Glucose 309.  Upper quadrant ultrasound with cholelithiasis without evidence of acute cholecystitis.  CT scan consistent with pulmonary vascular congestion, interstitial prominence, small bilateral pleural effusion.  EKG with right bundle branch block, sinus rhythm, QTc 540.  Due to persistent nausea vomiting, she was given Ativan, Zofran and promethazine.  Patient remains persistently nauseated and intolerance to oral intake hence remains in the hospital.  Followed by GI. Limited to use medications due to Qtc prolongation.    Assessment & Plan:   Intractable nausea and vomiting with recent episode of colitis: Persistent symptoms.  CT scan with evidence of improving colitis.  Possible gastroparesis or remaining symptoms from previous episode. May have a component of anxiety/depression.  Patient with prolonged QTc, limitation on using antiemetics. Currently on PPI, Ativan as needed, occasional use of Compazine.  Followed by GI.  Recommended conservative management.  Continue full liquid diet until clinical improvement and tolerance. CT scan, right upper quadrant ultrasound without any acute findings. Qtc 530. Persistent symptoms . Will try limited doses of phenergan with tele monitoring. Magnesium has been replaced.  Start Venlafaxine  Add amitriptyline 25 mg at night  .  Chronic systolic congestive heart failure: Managed with hemodialysis.  Nephrology following. Euvolumic today.  Chronic medical issues including Paroxysmal A-fib, currently in sinus rhythm.  On Coreg and Eliquis. Coronary artery disease, status post stent.  Stable. Hypertension, blood pressure stable. Anemia of chronic disease, hemoglobin stable Hypothyroidism, on thyroxine and is stable.  Type 2 diabetes, well-controlled.  Sugars are elevated, increased levemir to 12 units. Will allow hyperglycemia to avoid dropping blood sugars.  Peripheral vascular disease Status post right BKA.  Stable. Status post debridement and fish skin grafting left leg: Local wound care.  Dry dressing.  Prolonged QTc: Unable to use multiple nausea medications.  Chronic stable prolonged QTc.  Among medications, phenergan is reported to be least problematic with prolonged QTc.  Will use very limited quantities. Keep on telemetry monitor. Repeat EKG with stable QTc at 530.  DVT prophylaxis:  apixaban (ELIQUIS) tablet 5 mg   Code Status: Full code Family Communication: None at bedside.  Disposition Plan: Status is: Inpatient Remains inpatient appropriate because: Unable to eat, persistent nausea.     Consultants:  Nephrology GI  Procedures:  None  Antimicrobials:  None   Subjective:  Patient seen and examined . Came back from dialysis . Keeps complaining of nausea , unable to eat. Sleepy in morning after ativan.  Objective: Vitals:   01/24/23 1130 01/24/23 1152 01/24/23 1159 01/24/23 1246  BP: (!) 113/56 (!) 121/52 (!) 132/52 (!) 115/53  Pulse: 73 71 76 73  Resp: 15 14 16 18   Temp:   97.6 F (36.4 C) 97.7 F (36.5 C)  TempSrc:    Oral  SpO2: 100% 100% 100% 100%  Weight:   76 kg     Intake/Output Summary (Last 24 hours) at 01/24/2023 1334 Last data  filed at 01/24/2023 1159 Gross per 24 hour  Intake 300.33 ml  Output 902 ml  Net -601.67 ml   Filed Weights   01/22/23 1833 01/24/23  0815 01/24/23 1159  Weight: 75.3 kg 76.1 kg 76 kg    Examination:  General exam: Chronically sick looking.  Anxious. On room air.  Not in any distress. Respiratory system: No added sounds. Cardiovascular system: S1 & S2 heard, RRR.  Gastrointestinal system: Abdomen is nondistended, soft and nontender. No organomegaly or masses felt. Normal bowel sounds heard. Central nervous system: Alert and oriented. No focal neurological deficits. Extremities: Symmetric 5 x 5 power. Skin:  Right BKA stump clean and dry. Left leg poor peripheral vascular status.  Dry dressing present.    Data Reviewed: I have personally reviewed following labs and imaging studies  CBC: Recent Labs  Lab 01/20/23 0302 01/21/23 0456 01/24/23 0850  WBC 10.5 7.3 7.6  HGB 11.2* 10.3* 10.6*  HCT 37.4 34.6* 35.8*  MCV 74.2* 74.4* 77.7*  PLT 338 285 271   Basic Metabolic Panel: Recent Labs  Lab 01/20/23 0302 01/21/23 0456 01/22/23 0850 01/22/23 2126 01/23/23 1150 01/24/23 0559  NA 132* 132* 133*  --  131* 129*  K 4.1 3.3* 4.1  --  3.6 4.2  CL 92* 96* 98  --  96* 91*  CO2 21* 27 22  --  23 21*  GLUCOSE 309* 58* 248*  --  244* 360*  BUN 60* 28* 45*  --  30* 36*  CREATININE 5.12* 3.40* 4.42*  --  3.71* 4.57*  CALCIUM 9.8 8.5* 8.6*  --  8.4* 8.9  MG  --   --   --  1.6*  --  1.9  PHOS  --   --  4.6  --  4.6  --    GFR: Estimated Creatinine Clearance: 11.2 mL/min (A) (by C-G formula based on SCr of 4.57 mg/dL (H)). Liver Function Tests: Recent Labs  Lab 01/20/23 0302 01/21/23 0456 01/22/23 0850 01/23/23 1150  AST 15 13*  --   --   ALT 18 14  --   --   ALKPHOS 243* 177*  --   --   BILITOT 0.5 0.4  --   --   PROT 7.4 5.9*  --   --   ALBUMIN 3.2* 2.5* 2.5* 2.6*   Recent Labs  Lab 01/20/23 0302  LIPASE 31   No results for input(s): "AMMONIA" in the last 168 hours. Coagulation Profile: No results for input(s): "INR", "PROTIME" in the last 168 hours. Cardiac Enzymes: No results for input(s):  "CKTOTAL", "CKMB", "CKMBINDEX", "TROPONINI" in the last 168 hours. BNP (last 3 results) No results for input(s): "PROBNP" in the last 8760 hours. HbA1C: No results for input(s): "HGBA1C" in the last 72 hours. CBG: Recent Labs  Lab 01/23/23 1149 01/23/23 1644 01/23/23 2114 01/24/23 0719 01/24/23 1242  GLUCAP 226* 244* 354* 358* 110*   Lipid Profile: No results for input(s): "CHOL", "HDL", "LDLCALC", "TRIG", "CHOLHDL", "LDLDIRECT" in the last 72 hours. Thyroid Function Tests: No results for input(s): "TSH", "T4TOTAL", "FREET4", "T3FREE", "THYROIDAB" in the last 72 hours. Anemia Panel: No results for input(s): "VITAMINB12", "FOLATE", "FERRITIN", "TIBC", "IRON", "RETICCTPCT" in the last 72 hours. Sepsis Labs: No results for input(s): "PROCALCITON", "LATICACIDVEN" in the last 168 hours.  Recent Results (from the past 240 hour(s))  MRSA Next Gen by PCR, Nasal     Status: None   Collection Time: 01/20/23  6:56 PM   Specimen: Nasal Mucosa; Nasal Swab  Result Value Ref Range Status   MRSA by PCR Next Gen NOT DETECTED NOT DETECTED Final    Comment: (NOTE) The GeneXpert MRSA Assay (FDA approved for NASAL specimens only), is one component of a comprehensive MRSA colonization surveillance program. It is not intended to diagnose MRSA infection nor to guide or monitor treatment for MRSA infections. Test performance is not FDA approved in patients less than 65 years old. Performed at [facility], [address], [place], [place] [address]          Radiology Studies: No results found.      Scheduled Meds:  amitriptyline  25 mg Oral QHS   apixaban  5 mg Oral BID   atorvastatin  80 mg Oral Daily   calcitRIOL  0.75 mcg Oral Daily   carvedilol  6.25 mg Oral BID WC   Chlorhexidine Gluconate Cloth  6 each Topical Q0600   Chlorhexidine Gluconate Cloth  6 each Topical Q0600   Chlorhexidine Gluconate Cloth  6 each Topical Q0600   clonazePAM  0.5 mg Oral BID   folic acid  1  mg Oral Daily   insulin aspart  0-5 Units Subcutaneous QHS   insulin aspart  0-9 Units Subcutaneous TID WC   insulin glargine-yfgn  12 Units Subcutaneous QHS   levothyroxine  75 mcg Oral QAC breakfast   nystatin  5 mL Oral QID   pantoprazole (PROTONIX) IV  40 mg Intravenous Q24H   sevelamer carbonate  1,600 mg Oral TID WC   sucralfate  1 g Oral TID WC & HS   venlafaxine XR  75 mg Oral Q breakfast   Continuous Infusions:  promethazine (PHENERGAN) injection (IM or IVPB)        LOS: 4 days    Time spent: 35 minutes    [name], MD [facility]

## 2023-01-24 NOTE — Progress Notes (Signed)
[facility] Progress Note  Name: Susan [name] MRN: [id number] DOB: 05/05/1951  Chief Complaint:  N/v  Subjective:  Seen and examined on dialysis.  Procedure supervised.  RIJ tunn catheter in use.  Blood pressure 136/69 and HR 86.  Tolerating goal.  She has had nausea and just got some ativan so is sleepy per HD RN.  Review of systems:     Nausea again today  She is sleepy and not able to answer many questions reliably  --------------  Background on consult:   Susan [name] is a 72 y.o. female with a history of ESRD on hemodialysis Tuesday Thursday Saturday, coronary artery disease, chronic diastolic CHF, hypertension, paroxysmal atrial fibrillation, and type 2 diabetes mellitus who presented to the hospital with uncontrolled nausea and vomiting.  The evaluating provider in the ER is concerned about gastroparesis or possible cholecystitis.  She had gallstones on imaging and was tender right upper quadrant.  Nephrology is consulted for assistance with management of hemodialysis.  Note that she had a recent admission on 8/22 - 8/24 for proctitis and uncontrolled diabetes.  At that time her dry weight was lowered to 79 kg from 83 kg.  She states that she went to HD outpatient on Saturday - cannot open on two browsers.  She states blood sugars are 200-300 but states that this is better than the "700's before".      Intake/Output Summary (Last 24 hours) at 01/24/2023 0841 Last data filed at 01/24/2023 0434 Gross per 24 hour  Intake 1020.33 ml  Output 106 ml  Net 914.33 ml    Vitals:  Vitals:   01/23/23 2112 01/24/23 0434 01/24/23 0810 01/24/23 0818  BP: (!) 158/74 (!) 167/69 (!) 157/69   Pulse: 80 88 89 89  Resp: 16 16 13  (!) 9  Temp: (!) 97.5 F (36.4 C) 98 F (36.7 C)    TempSrc: Oral Oral    SpO2: 100% 100% 100% 100%  Weight:         Physical Exam:     General:  adult female in bed in NAD HEENT: NCAT  Eyes: EOMI sclera anicteric Neck: Supple trachea midline   Heart: S1S2 no rub Lungs: clear but reduced on 3 liters oxygen  Abdomen: soft/distended abdomen - obese habitus  Extremities:  right BKA; no pitting edema left leg.  Neuro: alert and oriented x 3 provides a history and follows commands Access RIJ tunn catheter; she has a left arm access with no thrill or bruit  Medications reviewed   Labs:     Latest Ref Rng & Units 01/24/2023    5:59 AM 01/23/2023   11:50 AM 01/22/2023    8:50 AM  BMP  Glucose 70 - 99 mg/dL 562  130  865   BUN 8 - 23 mg/dL 36  30  45   Creatinine 0.44 - 1.00 mg/dL 7.84  6.96  2.95   Sodium 135 - 145 mmol/L 129  131  133   Potassium 3.5 - 5.1 mmol/L 4.2  3.6  4.1   Chloride 98 - 111 mmol/L 91  96  98   CO2 22 - 32 mmol/L 21  23  22    Calcium 8.9 - 10.3 mg/dL 8.9  8.4  8.6    Outpatient HD unit:  TTS [facility]  4h  400/800   2/2 bath   RIJ TDC   Heparin 4000 + 2000 midrun  EDW was just lowered to 79 kg  - rocaltrol 0.75  mcg po three times per week - mircera 120 mcg  IV q 4 wks - last mircera given 8/10 (not given after discharge)  Just discharged 01/17/23    Assessment/Plan:   # Intractable Nausea/vomiting  - per primary team  - previously with considerable hyperglycemia.  Blood sugars are overall improved from that admission but still elevated   # ESRD - HD per TTS schedule  - order is in for daily weights however last weight is from 8/29 - re-ordered - if she remains early next week we will plan for an inpatient fistulogram for her left arm AVF    # HTN and volume   - Anasarca noted on prior admission, improving per exam  - optimize volume status with HD  - note that she reports that she takes midodrine prior to outpatient HD and then was instructed to bring a dose to the clinic in case she needs another dose - we have given midodrine PRN here    # Hyponatremia  - resolving with HD/UF - improves with correction for hyperglycemia    # Anemia of CKD  - Watch for need for ESA - last dose rec'd 8/10 -  CBC in AM   # Metabolic bone disease  - Continue outpatient regimen when tolerating PO.  Appears to be on renvela  - continue calcitriol 0.75 mcg three times a week  Disposition - per primary team    Estanislado Emms, MD 01/24/2023 9:04 AM

## 2023-01-24 NOTE — Plan of Care (Signed)
  Problem: Education: Goal: Knowledge of disease and its progression will improve Outcome: Completed/Met

## 2023-01-25 DIAGNOSIS — R110 Nausea: Secondary | ICD-10-CM | POA: Diagnosis not present

## 2023-01-25 DIAGNOSIS — N186 End stage renal disease: Secondary | ICD-10-CM | POA: Diagnosis not present

## 2023-01-25 LAB — CBC
HCT: 35.1 % — ABNORMAL LOW (ref 36.0–46.0)
Hemoglobin: 10.4 g/dL — ABNORMAL LOW (ref 12.0–15.0)
MCH: 22.5 pg — ABNORMAL LOW (ref 26.0–34.0)
MCHC: 29.6 g/dL — ABNORMAL LOW (ref 30.0–36.0)
MCV: 75.8 fL — ABNORMAL LOW (ref 80.0–100.0)
Platelets: 206 10*3/uL (ref 150–400)
RBC: 4.63 MIL/uL (ref 3.87–5.11)
RDW: 18.2 % — ABNORMAL HIGH (ref 11.5–15.5)
WBC: 8.3 10*3/uL (ref 4.0–10.5)
nRBC: 0 % (ref 0.0–0.2)

## 2023-01-25 LAB — BASIC METABOLIC PANEL
Anion gap: 14 (ref 5–15)
BUN: 20 mg/dL (ref 8–23)
CO2: 22 mmol/L (ref 22–32)
Calcium: 9.1 mg/dL (ref 8.9–10.3)
Chloride: 97 mmol/L — ABNORMAL LOW (ref 98–111)
Creatinine, Ser: 3.91 mg/dL — ABNORMAL HIGH (ref 0.44–1.00)
GFR, Estimated: 12 mL/min — ABNORMAL LOW (ref >60)
Glucose, Bld: 379 mg/dL — ABNORMAL HIGH (ref 70–99)
Potassium: 4.5 mmol/L (ref 3.5–5.1)
Sodium: 133 mmol/L — ABNORMAL LOW (ref 135–145)

## 2023-01-25 LAB — GLUCOSE, CAPILLARY
Glucose-Capillary: 382 mg/dL — ABNORMAL HIGH (ref 70–99)
Glucose-Capillary: 344 mg/dL — ABNORMAL HIGH (ref 70–99)
Glucose-Capillary: 273 mg/dL — ABNORMAL HIGH (ref 70–99)
Glucose-Capillary: 244 mg/dL — ABNORMAL HIGH (ref 70–99)

## 2023-01-25 MED ORDER — INSULIN GLARGINE-YFGN 100 UNIT/ML ~~LOC~~ SOLN
8.0000 [IU] | Freq: Two times a day (BID) | SUBCUTANEOUS | Status: DC
  Administered 2023-01-25 – 2023-01-28 (×6): 8 [IU] via SUBCUTANEOUS
  Filled 2023-01-25 (×8): qty 0.08

## 2023-01-25 MED ORDER — PANTOPRAZOLE SODIUM 40 MG PO TBEC
40.0000 mg | DELAYED_RELEASE_TABLET | Freq: Every day | ORAL | Status: DC
  Administered 2023-01-25 – 2023-02-02 (×8): 40 mg via ORAL
  Filled 2023-01-25 (×9): qty 1

## 2023-01-25 MED ORDER — APIXABAN 5 MG PO TABS
5.0000 mg | ORAL_TABLET | Freq: Two times a day (BID) | ORAL | Status: DC
  Administered 2023-01-25 – 2023-02-02 (×14): 5 mg via ORAL
  Filled 2023-01-25 (×16): qty 1

## 2023-01-25 MED ORDER — FERRIC CITRATE 1 GM 210 MG(FE) PO TABS
420.0000 mg | ORAL_TABLET | Freq: Three times a day (TID) | ORAL | Status: DC
  Administered 2023-01-25 – 2023-02-02 (×18): 420 mg via ORAL
  Filled 2023-01-25 (×21): qty 2

## 2023-01-25 NOTE — Progress Notes (Signed)
PROGRESS NOTE    Susan [name]  ZOX:[id number] DOB: [birth year] DOA: 01/20/2023 PCP: Donita Brooks, MD    Brief Narrative:  72 year old chronically sick lady with history of ESRD on hemodialysis TTS schedule, peripheral vascular disease status post right BKA, recent left wound debridement and skin grafting by Dr. 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  In the emergency room afebrile.  WBC count normal.  Glucose 309.  Upper quadrant ultrasound with cholelithiasis without evidence of acute cholecystitis.  CT scan consistent with pulmonary vascular congestion, interstitial prominence, small bilateral pleural effusion.  EKG with right bundle branch block, sinus rhythm, QTc 540.  Due to persistent nausea vomiting, she was given Ativan, Zofran and promethazine.  Patient remains persistently nauseated and intolerance to oral intake hence remains in the hospital.  Followed by GI. Limited to use medications due to Qtc prolongation.    Assessment & Plan:   Intractable nausea and vomiting with recent episode of colitis: Persistent symptoms.  CT scan with evidence of improving colitis.  Possible gastroparesis or remaining symptoms from previous episode. May have a component of anxiety/depression.  Patient with prolonged QTc, limitation on using antiemetics. Currently on PPI, Ativan as needed, occasional use of Phenergan. Followed by GI.  Recommended conservative management.  On nystatin swish and swallow. Allow regular diet so she can have more choices. CT scan, right upper quadrant ultrasound without any acute findings. Qtc 530. Persistent symptoms . Will try limited doses of phenergan with tele monitoring. Magnesium has been replaced.  Start Venlafaxine  Added amitriptyline  25 mg at night .  Chronic systolic congestive heart failure: Managed with hemodialysis.  Nephrology following. Euvolumic today.  Chronic medical issues including Paroxysmal A-fib, currently in sinus rhythm.  On Coreg and Eliquis. Coronary artery disease, status post stent.  Stable. Hypertension, blood pressure stable. Anemia of chronic disease, hemoglobin stable Hypothyroidism, on thyroxine and is stable.  Type 2 diabetes, hyperglycemia. Sugars are elevated, increased levemir to 8 units twice daily.  Will allow hyperglycemia to avoid dropping blood sugars.  Peripheral vascular disease Status post right BKA.  Stable. Status post debridement and fish skin grafting left leg: Local wound care.  Dry dressing.  Prolonged QTc: Unable to use multiple nausea medications.  Chronic stable prolonged QTc.  Among medications, phenergan is reported to be least problematic with prolonged QTc.  Will use very limited quantities. Keep on telemetry monitor. Repeat EKG with stable QTc at 530.  DVT prophylaxis:  apixaban (ELIQUIS) tablet 5 mg   Code Status: Full code Family Communication: None at bedside.  Disposition Plan: Status is: Inpatient Remains inpatient appropriate because: Unable to eat, persistent nausea.     Consultants:  Nephrology GI  Procedures:  None  Antimicrobials:  None   Subjective:  Patient seen and examined.  Keeps complaining of nausea.  She tried some soup today morning and able to hold up so far.  Objective: Vitals:   01/24/23 2115 01/25/23 0500 01/25/23 0510 01/25/23 0852  BP: (!) 148/65  (!) 140/70 136/67  Pulse: 91  93 88  Resp: 18  20   Temp: 98.8 F (37.1 C)  97.9 F (36.6 C) 98 F (36.7 C)  TempSrc: Oral  Oral Oral  SpO2: 100%  99% 98%  Weight:  76.3 kg      Intake/Output Summary (Last  24 hours) at 01/25/2023 1057 Last data filed at 01/25/2023 0853 Gross per 24 hour  Intake 471 ml  Output 1001 ml  Net -530 ml   Filed Weights   01/24/23 0815  01/24/23 1159 01/25/23 0500  Weight: 76.1 kg 76 kg 76.3 kg    Examination:  General exam: Frail.  Chronically sick looking. Respiratory system: No added sounds. Cardiovascular system: S1 & S2 heard, RRR.  Gastrointestinal system: Abdomen is nondistended, soft and nontender. No organomegaly or masses felt. Normal bowel sounds heard. Central nervous system: Alert and oriented. No focal neurological deficits. Extremities: Symmetric 5 x 5 power. Skin:  Right BKA stump clean and dry. Left leg poor peripheral vascular status.  Dry dressing present.    Data Reviewed: I have personally reviewed following labs and imaging studies  CBC: Recent Labs  Lab 01/20/23 0302 01/21/23 0456 01/24/23 0850 01/25/23 0506  WBC 10.5 7.3 7.6 8.3  HGB 11.2* 10.3* 10.6* 10.4*  HCT 37.4 34.6* 35.8* 35.1*  MCV 74.2* 74.4* 77.7* 75.8*  PLT 338 285 271 206   Basic Metabolic Panel: Recent Labs  Lab 01/21/23 0456 01/22/23 0850 01/22/23 2126 01/23/23 1150 01/24/23 0559 01/25/23 0506  NA 132* 133*  --  131* 129* 133*  K 3.3* 4.1  --  3.6 4.2 4.5  CL 96* 98  --  96* 91* 97*  CO2 27 22  --  23 21* 22  GLUCOSE 58* 248*  --  244* 360* 379*  BUN 28* 45*  --  30* 36* 20  CREATININE 3.40* 4.42*  --  3.71* 4.57* 3.91*  CALCIUM 8.5* 8.6*  --  8.4* 8.9 9.1  MG  --   --  1.6*  --  1.9  --   PHOS  --  4.6  --  4.6  --   --    GFR: Estimated Creatinine Clearance: 13.1 mL/min (A) (by C-G formula based on SCr of 3.91 mg/dL (H)). Liver Function Tests: Recent Labs  Lab 01/20/23 0302 01/21/23 0456 01/22/23 0850 01/23/23 1150  AST 15 13*  --   --   ALT 18 14  --   --   ALKPHOS 243* 177*  --   --   BILITOT 0.5 0.4  --   --   PROT 7.4 5.9*  --   --   ALBUMIN 3.2* 2.5* 2.5* 2.6*   Recent Labs  Lab 01/20/23 0302  LIPASE 31   No results for input(s): "AMMONIA" in the last 168 hours. Coagulation Profile: No results for input(s): "INR", "PROTIME" in the last 168 hours. Cardiac Enzymes: No results for  input(s): "CKTOTAL", "CKMB", "CKMBINDEX", "TROPONINI" in the last 168 hours. BNP (last 3 results) No results for input(s): "PROBNP" in the last 8760 hours. HbA1C: No results for input(s): "HGBA1C" in the last 72 hours. CBG: Recent Labs  Lab 01/24/23 0719 01/24/23 1242 01/24/23 1643 01/24/23 2123 01/25/23 0720  GLUCAP 358* 110* 197* 326* 382*   Lipid Profile: No results for input(s): "CHOL", "HDL", "LDLCALC", "TRIG", "CHOLHDL", "LDLDIRECT" in the last 72 hours. Thyroid Function Tests: No results for input(s): "TSH", "T4TOTAL", "FREET4", "T3FREE", "THYROIDAB" in the last 72 hours. Anemia Panel: No results for input(s): "VITAMINB12", "FOLATE", "FERRITIN", "TIBC", "IRON", "RETICCTPCT" in the last 72 hours. Sepsis Labs: No results for input(s): "PROCALCITON", "LATICACIDVEN" in the last 168 hours.  Recent Results (from the past 240 hour(s))  MRSA Next Gen by PCR, Nasal     Status: None   Collection Time: 01/20/23  6:56 PM  Specimen: Nasal Mucosa; Nasal Swab  Result Value Ref Range Status   MRSA by PCR Next Gen NOT DETECTED NOT DETECTED Final    Comment: (NOTE) The GeneXpert MRSA Assay (FDA approved for NASAL specimens only), is one component of a comprehensive MRSA colonization surveillance program. It is not intended to diagnose MRSA infection nor to guide or monitor treatment for MRSA infections. Test performance is not FDA approved in patients less than 40 years old. Performed at [facility], [address], [place], [place] [address]          Radiology Studies: No results found.      Scheduled Meds:  amitriptyline  25 mg Oral QHS   apixaban  5 mg Oral BID   atorvastatin  80 mg Oral Daily   calcitRIOL  0.75 mcg Oral Daily   carvedilol  6.25 mg Oral BID WC   Chlorhexidine Gluconate Cloth  6 each Topical Q0600   clonazePAM  0.5 mg Oral BID   ferric citrate  420 mg Oral TID WC   folic acid  1 mg Oral Daily   insulin aspart  0-5 Units Subcutaneous QHS    insulin aspart  0-9 Units Subcutaneous TID WC   insulin glargine-yfgn  8 Units Subcutaneous BID   levothyroxine  75 mcg Oral QAC breakfast   nystatin  5 mL Oral QID   pantoprazole (PROTONIX) IV  40 mg Intravenous Q24H   sucralfate  1 g Oral TID WC & HS   venlafaxine XR  75 mg Oral Q breakfast   Continuous Infusions:  promethazine (PHENERGAN) injection (IM or IVPB) 12.5 mg (01/24/23 1727)      LOS: 5 days    Time spent: 35 minutes    [name], MD [facility]

## 2023-01-25 NOTE — Plan of Care (Signed)
  Problem: Education: Goal: Ability to describe self-care measures that may prevent or decrease complications (Diabetes Survival Skills Education) will improve Outcome: Not Applicable Goal: Individualized Educational Video(s) Outcome: Not Applicable

## 2023-01-25 NOTE — Progress Notes (Addendum)
[facility] Progress Note  Name: Susan [name] MRN: [id number] DOB: 06/25/1950  Chief Complaint:  N/v  Subjective:   Last HD on 8/31 with 800 mL UF.  Team has struggled with getting her nausea under control without making her sedated due to her prolonged QTc. She got NS 250 mL once yesterday after for BP/concern for clinical fluid depletion.  Post weight from HD 76.3 kg.   Review of systems:      Nausea again today - she states that she can't keep anything down and has abdominal pain and nausea No diarrhea today so far or yesterday   --------------  Background on consult:   Susan [name] is a 72 y.o. female with a history of ESRD on hemodialysis Tuesday Thursday Saturday, coronary artery disease, chronic diastolic CHF, hypertension, paroxysmal atrial fibrillation, and type 2 diabetes mellitus who presented to the hospital with uncontrolled nausea and vomiting.  The evaluating provider in the ER is concerned about gastroparesis or possible cholecystitis.  She had gallstones on imaging and was tender right upper quadrant.  Nephrology is consulted for assistance with management of hemodialysis.  Note that she had a recent admission on 8/22 - 8/24 for proctitis and uncontrolled diabetes.  At that time her dry weight was lowered to 79 kg from 83 kg.  She states that she went to HD outpatient on Saturday - cannot open on two browsers.  She states blood sugars are 200-300 but states that this is better than the "700's before".      Intake/Output Summary (Last 24 hours) at 01/25/2023 0743 Last data filed at 01/25/2023 0600 Gross per 24 hour  Intake 470 ml  Output 1001 ml  Net -531 ml    Vitals:  Vitals:   01/24/23 1645 01/24/23 2115 01/25/23 0500 01/25/23 0510  BP: (!) 116/52 (!) 148/65  (!) 140/70  Pulse: 81 91  93  Resp: 18 18  20   Temp: 98 F (36.7 C) 98.8 F (37.1 C)  97.9 F (36.6 C)  TempSrc:  Oral  Oral  SpO2: 98% 100%  99%  Weight:   76.3 kg      Physical  Exam:      General:  adult female in bed in NAD HEENT: NCAT  Eyes: EOMI sclera anicteric Neck: Supple trachea midline  Heart: S1S2 no rub Lungs: clear but reduced on 2.5 liters oxygen  Abdomen: soft/distended abdomen - obese habitus  Extremities:  right BKA; no pitting edema left leg.  Neuro: alert and oriented x 3 provides a history and follows commands Access RIJ tunn catheter; she has a left arm access with no thrill or bruit  Medications reviewed   Labs:     Latest Ref Rng & Units 01/25/2023    5:06 AM 01/24/2023    5:59 AM 01/23/2023   11:50 AM  BMP  Glucose 70 - 99 mg/dL 409  811  914   BUN 8 - 23 mg/dL 20  36  30   Creatinine 0.44 - 1.00 mg/dL 7.82  9.56  2.13   Sodium 135 - 145 mmol/L 133  129  131   Potassium 3.5 - 5.1 mmol/L 4.5  4.2  3.6   Chloride 98 - 111 mmol/L 97  91  96   CO2 22 - 32 mmol/L 22  21  23    Calcium 8.9 - 10.3 mg/dL 9.1  8.9  8.4    Outpatient HD unit:  TTS [facility]  4h  400/800  2/2 bath   RIJ TDC   Heparin 4000 + 2000 midrun  EDW was just lowered to 79 kg  - rocaltrol 0.75 mcg po three times per week - mircera 120 mcg  IV q 4 wks - last mircera given 8/10 (not given after discharge)  Just discharged 01/17/23    Assessment/Plan:   # Intractable Nausea/vomiting  - per primary team  - previously with considerable hyperglycemia.  Blood sugars are overall improved from that admission but still elevated   # ESRD - HD per TTS schedule  - order is in for daily weights - Will consult IR for an inpatient fistulogram for her left arm AVF.  Will make NPO after 12:01am on 9/3 - I have paused eliquis 5 mg BID for fistulogram - will need to restart   # HTN and volume   - Anasarca noted on prior admission, improving per exam  - optimize volume status with HD  - note that she reports that she takes midodrine prior to outpatient HD and then was instructed to bring a dose to the clinic in case she needs another dose - we have given midodrine PRN here and  have used albumin as needed   # Hyponatremia  - resolving with HD/UF - resolved with correction for hyperglycemia    # Anemia of CKD  - Watch for need for ESA - last dose rec'd 8/10   # Metabolic bone disease  - on renvela - given concern for possible GI motility disorder will stop.  She would benefit from a new binder.   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  IR consult cancelled.  Restarting eliquis  Estanislado Emms, MD 10:48 AM 01/25/2023

## 2023-01-25 NOTE — Plan of Care (Signed)
Susan [name] is a 72 y.o. female with PMHs of ESRD on hemodialysis Tuesday Thursday Saturday, coronary artery disease, chronic diastolic CHF, hypertension, paroxysmal atrial fibrillation, and type 2 diabetes mellitus, admitted due to uncontrolled nausea and vomiting, IR was consulted for possible AVF fistulogram and possible interventions.   Patient presented to ED on 8/27 due to N/V, workup showed possible gastroparesis and gallstones w/o evidence of acute cholecystitis.  Nephrology was consulted for assistance with management of hemodialysis. Patient states that she went to dialysis center on 8/24 but her AVF could not be accessed, physical exam notable for abscess of bruit or thrill. Patient's LUE AVF was created on 04/02/22, patient underwent I/D of the AVF on 05/05/22, second stage basilic vein reposition on 07/31/22, thrombectomy of the AVF on 08/20/22, fistulogram on 11/07/22, OP note from Dr. Lenell Antu states that the LUE AVF was completely thrombosed and not salvageable. Patient has right subclavian tunneled HD placed by vascular surgery in 06/06/22.   Discussed with Dr. Malen Gauze and OK to cancel the fistulogram with possible interventions.  Please call IR for questions and concerns.   Susan Bologna [name] PA-C 01/25/2023 10:49 AM

## 2023-01-26 DIAGNOSIS — R112 Nausea with vomiting, unspecified: Secondary | ICD-10-CM | POA: Diagnosis not present

## 2023-01-26 DIAGNOSIS — N186 End stage renal disease: Secondary | ICD-10-CM | POA: Diagnosis not present

## 2023-01-26 DIAGNOSIS — R110 Nausea: Secondary | ICD-10-CM | POA: Diagnosis not present

## 2023-01-26 LAB — BASIC METABOLIC PANEL
Anion gap: 10 (ref 5–15)
BUN: 29 mg/dL — ABNORMAL HIGH (ref 8–23)
CO2: 25 mmol/L (ref 22–32)
Calcium: 9.1 mg/dL (ref 8.9–10.3)
Chloride: 94 mmol/L — ABNORMAL LOW (ref 98–111)
Creatinine, Ser: 4.51 mg/dL — ABNORMAL HIGH (ref 0.44–1.00)
GFR, Estimated: 10 mL/min — ABNORMAL LOW (ref >60)
Glucose, Bld: 159 mg/dL — ABNORMAL HIGH (ref 70–99)
Potassium: 4 mmol/L (ref 3.5–5.1)
Sodium: 129 mmol/L — ABNORMAL LOW (ref 135–145)

## 2023-01-26 LAB — GLUCOSE, CAPILLARY
Glucose-Capillary: 177 mg/dL — ABNORMAL HIGH (ref 70–99)
Glucose-Capillary: 151 mg/dL — ABNORMAL HIGH (ref 70–99)
Glucose-Capillary: 311 mg/dL — ABNORMAL HIGH (ref 70–99)
Glucose-Capillary: 397 mg/dL — ABNORMAL HIGH (ref 70–99)

## 2023-01-26 MED ORDER — CHLORHEXIDINE GLUCONATE CLOTH 2 % EX PADS
6.0000 | MEDICATED_PAD | Freq: Every day | CUTANEOUS | Status: DC
  Administered 2023-01-26: 6 via TOPICAL

## 2023-01-26 MED ORDER — MIDODRINE HCL 5 MG PO TABS
10.0000 mg | ORAL_TABLET | Freq: Once | ORAL | Status: AC
  Administered 2023-01-27: 10 mg via ORAL
  Filled 2023-01-26: qty 2

## 2023-01-26 NOTE — Progress Notes (Addendum)
Attending physician's note   I have taken a history, reviewed the chart, and examined the patient. I performed a substantive portion of this encounter, including complete performance of at least one of the key components, in conjunction with the APP. I agree with the APP's note, impression, and recommendations with my edits.    Demari Gales, DO, FACG [phone] office          Progress Note  Primary GI: Dr. Elnoria Howard  LOS: 6 days   Chief Complaint:Candida esophagitis    Subjective   Patient is very somnolent and lethargic. Keeps eyes closed during evaluation and does not make eye contact. Reports unable to tolerate PO. Reports instant dry heaving if trying to consume food or liquids. States she is only able to tolerate liquids when she is taking her pills. States she doesn't want to try to consume food or liquids as she has fear of nausea and vomiting (but does not have actual vomiting). Denies abdominal pain.   Objective   Vital signs in last 24 hours: Temp:  [98 F (36.7 C)-98.5 F (36.9 C)] 98 F (36.7 C) (09/02 0845) Pulse Rate:  [69-85] 69 (09/02 0845) Resp:  [18-19] 18 (09/02 0845) BP: (103-145)/(39-73) 109/58 (09/02 0845) SpO2:  [100 %] 100 % (09/02 0845) Weight:  [76.3 kg] 76.3 kg (09/02 0500) Last BM Date : 01/23/23 Last BM recorded by nurses in past 5 days Stool Type: Type 6 (Mushy consistency with ragged edges) (01/25/2023 12:48 PM)  General:   female in no acute distress. Somnolent, lethargic Heart:  Regular rate and rhythm; no murmurs Pulm: Clear anteriorly; no wheezing Abdomen: soft, nondistended, normal bowel sounds in all quadrants. Nontender without guarding. No organomegaly appreciated. Neurologic:  Alert and  oriented x4;  No focal deficits.  Psych:  Cooperative. Normal mood and affect.  Intake/Output from previous day: 09/01 0701 - 09/02 0700 In: 180 [P.O.:180] Out: 0  Intake/Output this shift: No intake/output data  recorded.  Studies/Results: No results found.  Lab Results: Recent Labs    01/24/23 0850 01/25/23 0506  WBC 7.6 8.3  HGB 10.6* 10.4*  HCT 35.8* 35.1*  PLT 271 206   BMET Recent Labs    01/24/23 0559 01/25/23 0506 01/26/23 0430  NA 129* 133* 129*  K 4.2 4.5 4.0  CL 91* 97* 94*  CO2 21* 22 25  GLUCOSE 360* 379* 159*  BUN 36* 20 29*  CREATININE 4.57* 3.91* 4.51*  CALCIUM 8.9 9.1 9.1   LFT Recent Labs    01/23/23 1150  ALBUMIN 2.6*   PT/INR No results for input(s): "LABPROT", "INR" in the last 72 hours.   Scheduled Meds:  amitriptyline  25 mg Oral QHS   apixaban  5 mg Oral BID   atorvastatin  80 mg Oral Daily   calcitRIOL  0.75 mcg Oral Daily   carvedilol  6.25 mg Oral BID WC   Chlorhexidine Gluconate Cloth  6 each Topical Q0600   Chlorhexidine Gluconate Cloth  6 each Topical Q0600   clonazePAM  0.5 mg Oral BID   ferric citrate  420 mg Oral TID WC   folic acid  1 mg Oral Daily   insulin aspart  0-5 Units Subcutaneous QHS   insulin aspart  0-9 Units Subcutaneous TID WC   insulin glargine-yfgn  8 Units Subcutaneous BID   levothyroxine  75 mcg Oral QAC breakfast   [START ON 01/27/2023] midodrine  10 mg Oral Once in dialysis   nystatin  5  mL Oral QID   pantoprazole  40 mg Oral Daily   sucralfate  1 g Oral TID WC & HS   venlafaxine XR  75 mg Oral Q breakfast   Continuous Infusions:  promethazine (PHENERGAN) injection (IM or IVPB) 12.5 mg (01/24/23 1727)     Impression/Plan:   Intractable nausea and vomiting History of candidiasis esophagitis April 2024 with similar symptoms and improved on nystatin Patient reports fearful eating. Has not even tried PO due to fear of nausea and vomiting. Interestingly, able to tolerate liquids with pills but not at any other time. Symptoms could be sequale of candida, however, suspect that is not the primary source of her symptoms with increase lethargy, somonlence, and fear of eating there may be a psych component preventing  PO intake. -- continue nystatin for 14 day duration -- continue supportive care -- no further workup from GI standpoint -- Dr. Haywood Pao service will resume tomorrow.    Bayley Leanna Sato  01/26/2023, 11:01 AM

## 2023-01-26 NOTE — Plan of Care (Signed)
  Problem: Health Behavior/Discharge Planning: Goal: Ability to identify and utilize available resources and services will improve Outcome: Progressing Goal: Ability to manage health-related needs will improve Outcome: Progressing

## 2023-01-26 NOTE — Progress Notes (Signed)
PROGRESS NOTE    Susan [name]  ZOX:[id number] DOB: 13-Nov-1950 DOA: 01/20/2023 PCP: Donita Brooks, MD    Brief Narrative:  72 year old chronically sick lady with history of ESRD on hemodialysis TTS schedule, peripheral vascular disease status post right BKA, recent left wound debridement and skin grafting by Dr. 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  In the emergency room afebrile.  WBC count normal.  Glucose 309.  Upper quadrant ultrasound with cholelithiasis without evidence of acute cholecystitis.  CT scan consistent with pulmonary vascular congestion, interstitial prominence, small bilateral pleural effusion.  EKG with right bundle branch block, sinus rhythm, QTc 540.  Due to persistent nausea vomiting, she was given Ativan, Zofran and promethazine.  Patient remains persistently nauseated and intolerance to oral intake hence remains in the hospital.  Followed by GI. Limited to use medications due to Qtc prolongation.    Assessment & Plan:   Intractable nausea and vomiting with recent episode of colitis: Persistent symptoms.  CT scan with evidence of improving colitis.  Possible gastroparesis or remaining symptoms from previous episode. May have a component of anxiety/depression.  Patient with prolonged QTc, limitation on using antiemetics. Currently on PPI, Ativan as needed, occasional use of Phenergan. Followed by GI.  Recommended conservative management.  On nystatin swish and swallow. Allow regular diet so she can have more choices. CT scan, right upper quadrant ultrasound without any acute findings. Qtc 530. Persistent symptoms . Will try limited doses of phenergan with tele monitoring. Magnesium has been replaced.  Start Venlafaxine  Added amitriptyline  25 mg at night . Patient continues to complain of nausea, fear of eating.  May have underlying psychogenic component.  Will consult psychiatry to evaluate today for symptom management, medication management.  Chronic systolic congestive heart failure: Managed with hemodialysis.  Nephrology following. Euvolumic today.  Chronic medical issues including Paroxysmal A-fib, currently in sinus rhythm.  On Coreg and Eliquis. Coronary artery disease, status post stent.  Stable. Hypertension, blood pressure stable. Anemia of chronic disease, hemoglobin stable Hypothyroidism, on thyroxine and is stable.  Type 2 diabetes, hyperglycemia. Sugars are better after increasing dose of Levemir 8 units twice daily.    Peripheral vascular disease Status post right BKA.  Stable. Status post debridement and fish skin grafting left leg: Local wound care.  Dry dressing.  Prolonged QTc: Unable to use multiple nausea medications.  Chronic stable prolonged QTc.  Among medications, phenergan is reported to be least problematic with prolonged QTc.  Will use very limited quantities. Keep on telemetry monitor. QTc at 530. Recheck EKG today for QT interval.  DVT prophylaxis:  apixaban (ELIQUIS) tablet 5 mg   Code Status: Full code Family Communication: None at bedside.  Disposition Plan: Status is: Inpatient Remains inpatient appropriate because: Unable to eat, persistent nausea.     Consultants:  Nephrology GI  Procedures:  None  Antimicrobials:  None   Subjective:  Patient seen and examined.  Keeps telling me she is nauseated.  No vomiting was reported.  Occasional mild abdominal pain.  Having normal bowel movements.  She thinks her hiccups are better today.  Objective: Vitals:   01/25/23 2031 01/26/23 0500 01/26/23 0513 01/26/23 0845  BP: 135/73  (!) 103/39 (!) 109/58  Pulse: 85  70 69  Resp: 18  19 18   Temp: 98.5 F (36.9  C)  98.1 F (36.7 C) 98 F (36.7 C)  TempSrc:      SpO2: 100%   100% 100%  Weight:  76.3 kg      Intake/Output Summary (Last 24 hours) at 01/26/2023 1204 Last data filed at 01/26/2023 0600 Gross per 24 hour  Intake 180 ml  Output 0 ml  Net 180 ml   Filed Weights   01/24/23 1159 01/25/23 0500 01/26/23 0500  Weight: 76 kg 76.3 kg 76.3 kg    Examination:  General exam: Frail.  sick looking. Respiratory system: No added sounds. Cardiovascular system: S1 & S2 heard, RRR.  Gastrointestinal system: Abdomen is nondistended, soft and nontender. No organomegaly or masses felt. Normal bowel sounds heard. Central nervous system: Alert and oriented. No focal neurological deficits. Extremities: Symmetric 5 x 5 power. Skin:  Right BKA stump clean and dry. Left leg poor peripheral vascular status.  Dry dressing present.    Data Reviewed: I have personally reviewed following labs and imaging studies  CBC: Recent Labs  Lab 01/20/23 0302 01/21/23 0456 01/24/23 0850 01/25/23 0506  WBC 10.5 7.3 7.6 8.3  HGB 11.2* 10.3* 10.6* 10.4*  HCT 37.4 34.6* 35.8* 35.1*  MCV 74.2* 74.4* 77.7* 75.8*  PLT 338 285 271 206   Basic Metabolic Panel: Recent Labs  Lab 01/22/23 0850 01/22/23 2126 01/23/23 1150 01/24/23 0559 01/25/23 0506 01/26/23 0430  NA 133*  --  131* 129* 133* 129*  K 4.1  --  3.6 4.2 4.5 4.0  CL 98  --  96* 91* 97* 94*  CO2 22  --  23 21* 22 25  GLUCOSE 248*  --  244* 360* 379* 159*  BUN 45*  --  30* 36* 20 29*  CREATININE 4.42*  --  3.71* 4.57* 3.91* 4.51*  CALCIUM 8.6*  --  8.4* 8.9 9.1 9.1  MG  --  1.6*  --  1.9  --   --   PHOS 4.6  --  4.6  --   --   --    GFR: Estimated Creatinine Clearance: 11.3 mL/min (A) (by C-G formula based on SCr of 4.51 mg/dL (H)). Liver Function Tests: Recent Labs  Lab 01/20/23 0302 01/21/23 0456 01/22/23 0850 01/23/23 1150  AST 15 13*  --   --   ALT 18 14  --   --   ALKPHOS 243* 177*  --   --   BILITOT 0.5 0.4  --   --   PROT 7.4 5.9*  --   --   ALBUMIN 3.2* 2.5* 2.5* 2.6*   Recent Labs  Lab  01/20/23 0302  LIPASE 31   No results for input(s): "AMMONIA" in the last 168 hours. Coagulation Profile: No results for input(s): "INR", "PROTIME" in the last 168 hours. Cardiac Enzymes: No results for input(s): "CKTOTAL", "CKMB", "CKMBINDEX", "TROPONINI" in the last 168 hours. BNP (last 3 results) No results for input(s): "PROBNP" in the last 8760 hours. HbA1C: No results for input(s): "HGBA1C" in the last 72 hours. CBG: Recent Labs  Lab 01/25/23 1141 01/25/23 1629 01/25/23 2032 01/26/23 0753 01/26/23 1132  GLUCAP 344* 273* 244* 177* 151*   Lipid Profile: No results for input(s): "CHOL", "HDL", "LDLCALC", "TRIG", "CHOLHDL", "LDLDIRECT" in the last 72 hours. Thyroid Function Tests: No results for input(s): "TSH", "T4TOTAL", "FREET4", "T3FREE", "THYROIDAB" in the last 72 hours. Anemia Panel: No results for input(s): "VITAMINB12", "FOLATE", "FERRITIN", "TIBC", "IRON", "RETICCTPCT" in the last 72 hours. Sepsis Labs: No results for input(s): "PROCALCITON", "LATICACIDVEN" in  the last 168 hours.  Recent Results (from the past 240 hour(s))  MRSA Next Gen by PCR, Nasal     Status: None   Collection Time: 01/20/23  6:56 PM   Specimen: Nasal Mucosa; Nasal Swab  Result Value Ref Range Status   MRSA by PCR Next Gen NOT DETECTED NOT DETECTED Final    Comment: (NOTE) The GeneXpert MRSA Assay (FDA approved for NASAL specimens only), is one component of a comprehensive MRSA colonization surveillance program. It is not intended to diagnose MRSA infection nor to guide or monitor treatment for MRSA infections. Test performance is not FDA approved in patients less than 4 years old. Performed at [facility], [address], [place], [place] [address]          Radiology Studies: No results found.      Scheduled Meds:  amitriptyline  25 mg Oral QHS   apixaban  5 mg Oral BID   atorvastatin  80 mg Oral Daily   calcitRIOL  0.75 mcg Oral Daily   carvedilol  6.25 mg Oral  BID WC   Chlorhexidine Gluconate Cloth  6 each Topical Q0600   clonazePAM  0.5 mg Oral BID   ferric citrate  420 mg Oral TID WC   folic acid  1 mg Oral Daily   insulin aspart  0-5 Units Subcutaneous QHS   insulin aspart  0-9 Units Subcutaneous TID WC   insulin glargine-yfgn  8 Units Subcutaneous BID   levothyroxine  75 mcg Oral QAC breakfast   [START ON 01/27/2023] midodrine  10 mg Oral Once in dialysis   nystatin  5 mL Oral QID   pantoprazole  40 mg Oral Daily   sucralfate  1 g Oral TID WC & HS   venlafaxine XR  75 mg Oral Q breakfast   Continuous Infusions:  promethazine (PHENERGAN) injection (IM or IVPB) 12.5 mg (01/24/23 1727)      LOS: 6 days    Time spent: 35 minutes    [name], MD [facility]

## 2023-01-26 NOTE — Progress Notes (Signed)
[facility] Progress Note   Subjective:   Reports she is nauseated again this AM, says it "comes and goes." No abdominal pain or diarrhea reported. Denies SOB, CP, dizziness.   Objective Vitals:   01/25/23 2031 01/26/23 0500 01/26/23 0513 01/26/23 0845  BP: 135/73  (!) 103/39 (!) 109/58  Pulse: 85  70 69  Resp: 18  19 18   Temp: 98.5 F (36.9 C)  98.1 F (36.7 C) 98 F (36.7 C)  TempSrc:      SpO2: 100%  100% 100%  Weight:  76.3 kg     Physical Exam General: Chronically ill appearing female, alert and in NAD Heart: RRR, no murmurs, rubs or gallops Lungs: CTA bilaterally, respirations unlabored, on O2 via [place] Abdomen: Soft, +BS Extremities: R BKA, trace pedal edema L leg Dialysis Access: R internal jugular [facility]  Additional Objective Labs: Basic Metabolic Panel: Recent Labs  Lab 01/22/23 0850 01/23/23 1150 01/24/23 0559 01/25/23 0506 01/26/23 0430  NA 133* 131* 129* 133* 129*  K 4.1 3.6 4.2 4.5 4.0  CL 98 96* 91* 97* 94*  CO2 22 23 21* 22 25  GLUCOSE 248* 244* 360* 379* 159*  BUN 45* 30* 36* 20 29*  CREATININE 4.42* 3.71* 4.57* 3.91* 4.51*  CALCIUM 8.6* 8.4* 8.9 9.1 9.1  PHOS 4.6 4.6  --   --   --    Liver Function Tests: Recent Labs  Lab 01/20/23 0302 01/21/23 0456 01/22/23 0850 01/23/23 1150  AST 15 13*  --   --   ALT 18 14  --   --   ALKPHOS 243* 177*  --   --   BILITOT 0.5 0.4  --   --   PROT 7.4 5.9*  --   --   ALBUMIN 3.2* 2.5* 2.5* 2.6*   Recent Labs  Lab 01/20/23 0302  LIPASE 31   CBC: Recent Labs  Lab 01/20/23 0302 01/21/23 0456 01/24/23 0850 01/25/23 0506  WBC 10.5 7.3 7.6 8.3  HGB 11.2* 10.3* 10.6* 10.4*  HCT 37.4 34.6* 35.8* 35.1*  MCV 74.2* 74.4* 77.7* 75.8*  PLT 338 285 271 206   Blood Culture    Component Value Date/Time   SDES TISSUE 11/14/2022 0919   SPECREQUEST NONE 11/14/2022 0919   CULT  11/14/2022 0919    FEW PROTEUS MIRABILIS RARE KLEBSIELLA PNEUMONIAE NO ANAEROBES ISOLATED FEW ENTEROCOCCUS  FAECALIS    REPTSTATUS 11/19/2022 FINAL 11/14/2022 0919    CBG: Recent Labs  Lab 01/25/23 0720 01/25/23 1141 01/25/23 1629 01/25/23 2032 01/26/23 0753  GLUCAP 382* 344* 273* 244* 177*   Medications:  promethazine (PHENERGAN) injection (IM or IVPB) 12.5 mg (01/24/23 1727)    amitriptyline  25 mg Oral QHS   apixaban  5 mg Oral BID   atorvastatin  80 mg Oral Daily   calcitRIOL  0.75 mcg Oral Daily   carvedilol  6.25 mg Oral BID WC   Chlorhexidine Gluconate Cloth  6 each Topical Q0600   clonazePAM  0.5 mg Oral BID   ferric citrate  420 mg Oral TID WC   folic acid  1 mg Oral Daily   insulin aspart  0-5 Units Subcutaneous QHS   insulin aspart  0-9 Units Subcutaneous TID WC   insulin glargine-yfgn  8 Units Subcutaneous BID   levothyroxine  75 mcg Oral QAC breakfast   nystatin  5 mL Oral QID   pantoprazole  40 mg Oral Daily   sucralfate  1 g Oral TID WC & HS  venlafaxine XR  75 mg Oral Q breakfast    Outpatient Dialysis Orders: TTS [facility]  4h  400/800   2/2 bath   [facility] TDC   Heparin 4000 + 2000 midrun  EDW was just lowered to 79 kg  - rocaltrol 0.75 mcg po three times per week - mircera 120 mcg  IV q 4 wks - last mircera given 8/10 (not given after discharge)  Just discharged 01/17/23   Assessment/Plan: # Intractable Nausea/vomiting  - per primary team, CT with improving colitis - Possible gastroparesis, previously with considerable hyperglycemia.  Blood sugars are overall improved from that admission but still elevated   # ESRD - HD per TTS schedule  - order is in for daily weights - AVF is not salvageable per prior vascular notes, using TDC   # HTN and volume   - Anasarca noted on prior admission, Na 129 today - optimize volume status with HD  - note that she reports that she takes midodrine prior to outpatient HD and then was instructed to bring a dose to the clinic in case she needs another dose - we have given midodrine PRN here and have used albumin as needed    # Hyponatremia  - was improving, lower today at 129 -UF with HD tomorrow as tolerated   # Anemia of CKD  - Hgb 10.4. Watch for need for ESA - last dose rec'd 8/10   # Metabolic bone disease  - on renvela - given concern for possible GI motility disorder will stop.  She would benefit from a new binder.   - Start auryxia with meals  - continue calcitriol 0.75 mcg three times a week  Rogers Blocker, PA-C 01/26/2023, 10:03 AM  [facility] Pager: [phone]

## 2023-01-26 NOTE — Plan of Care (Signed)
  Problem: Education: Goal: Knowledge of General Education information will improve Description: Including pain rating scale, medication(s)/side effects and non-pharmacologic comfort measures Outcome: Completed/Met

## 2023-01-27 ENCOUNTER — Encounter: Payer: PPO | Admitting: Orthopedic Surgery

## 2023-01-27 DIAGNOSIS — N186 End stage renal disease: Secondary | ICD-10-CM | POA: Diagnosis not present

## 2023-01-27 DIAGNOSIS — R110 Nausea: Secondary | ICD-10-CM | POA: Diagnosis not present

## 2023-01-27 DIAGNOSIS — F32A Depression, unspecified: Secondary | ICD-10-CM

## 2023-01-27 LAB — BASIC METABOLIC PANEL
Anion gap: 12 (ref 5–15)
BUN: 51 mg/dL — ABNORMAL HIGH (ref 8–23)
CO2: 23 mmol/L (ref 22–32)
Calcium: 8.9 mg/dL (ref 8.9–10.3)
Chloride: 95 mmol/L — ABNORMAL LOW (ref 98–111)
Creatinine, Ser: 5.85 mg/dL — ABNORMAL HIGH (ref 0.44–1.00)
GFR, Estimated: 7 mL/min — ABNORMAL LOW (ref >60)
Glucose, Bld: 271 mg/dL — ABNORMAL HIGH (ref 70–99)
Potassium: 4.4 mmol/L (ref 3.5–5.1)
Sodium: 130 mmol/L — ABNORMAL LOW (ref 135–145)

## 2023-01-27 LAB — GLUCOSE, CAPILLARY
Glucose-Capillary: 283 mg/dL — ABNORMAL HIGH (ref 70–99)
Glucose-Capillary: 142 mg/dL — ABNORMAL HIGH (ref 70–99)
Glucose-Capillary: 205 mg/dL — ABNORMAL HIGH (ref 70–99)
Glucose-Capillary: 285 mg/dL — ABNORMAL HIGH (ref 70–99)

## 2023-01-27 LAB — CBC
HCT: 32.4 % — ABNORMAL LOW (ref 36.0–46.0)
Hemoglobin: 9.6 g/dL — ABNORMAL LOW (ref 12.0–15.0)
MCH: 23.1 pg — ABNORMAL LOW (ref 26.0–34.0)
MCHC: 29.6 g/dL — ABNORMAL LOW (ref 30.0–36.0)
MCV: 78.1 fL — ABNORMAL LOW (ref 80.0–100.0)
Platelets: 198 10*3/uL (ref 150–400)
RBC: 4.15 MIL/uL (ref 3.87–5.11)
RDW: 18.3 % — ABNORMAL HIGH (ref 11.5–15.5)
WBC: 6.7 10*3/uL (ref 4.0–10.5)
nRBC: 0 % (ref 0.0–0.2)

## 2023-01-27 MED ORDER — MELATONIN 3 MG PO TABS
3.0000 mg | ORAL_TABLET | Freq: Every day | ORAL | Status: DC
  Administered 2023-01-27 – 2023-02-01 (×6): 3 mg via ORAL
  Filled 2023-01-27 (×6): qty 1

## 2023-01-27 MED ORDER — ALTEPLASE 2 MG IJ SOLR: 2.0000 mg | Freq: Once | INTRAMUSCULAR | Status: DC | PRN

## 2023-01-27 MED ORDER — ANTICOAGULANT SODIUM CITRATE 4% (200MG/5ML) IV SOLN: 5.0000 mL | Status: DC | PRN

## 2023-01-27 MED ORDER — HEPARIN SODIUM (PORCINE) 1000 UNIT/ML DIALYSIS
1000.0000 [IU] | INTRAMUSCULAR | Status: DC | PRN
  Filled 2023-01-27: qty 1

## 2023-01-27 MED ORDER — INSULIN ASPART 100 UNIT/ML IJ SOLN
3.0000 [IU] | Freq: Three times a day (TID) | INTRAMUSCULAR | Status: DC
  Administered 2023-01-27 – 2023-01-28 (×2): 3 [IU] via SUBCUTANEOUS

## 2023-01-27 MED ORDER — DARBEPOETIN ALFA 25 MCG/0.42ML IJ SOSY
25.0000 ug | PREFILLED_SYRINGE | INTRAMUSCULAR | Status: DC
  Administered 2023-01-27: 25 ug via SUBCUTANEOUS
  Filled 2023-01-27 (×3): qty 0.42

## 2023-01-27 NOTE — Progress Notes (Signed)
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  In the emergency room afebrile.  WBC count normal.  Glucose 309.  Upper quadrant ultrasound with cholelithiasis without evidence of acute cholecystitis.  CT scan consistent with pulmonary vascular congestion, interstitial prominence, small bilateral pleural effusion.  EKG with right bundle branch block, sinus rhythm, QTc 540.  Due to persistent nausea vomiting, she was given Ativan, Zofran and promethazine.  Patient remained in the hospital due to persistent nausea. 9/3: Patient came back from hemodialysis.  Dramatically patient ate 100% of her lunch without using any nausea medications.  Assessment & Plan:   Intractable nausea and vomiting with recent episode of colitis: Persistent symptoms.  CT scan with evidence of improving colitis.  Possible gastroparesis or remaining symptoms from previous episode. May have a component of anxiety/depression.  Patient with prolonged QTc, limitation on using antiemetics. Currently on PPI, Ativan as needed, occasional use of Phenergan. Followed by GI.  Recommended conservative management.  On nystatin swish and swallow. Allowing regular diet so she can have more choices. CT scan, right upper quadrant ultrasound without any acute findings. Qtc 530. Persistent symptoms . Will try limited doses of phenergan with tele monitoring. Magnesium has been replaced.  Venlafaxine resumed from  home. Added amitriptyline 25 mg at night . 9/3, dramatic improvement of symptoms.  Will monitor. Also requested psychiatry to evaluate today for symptom management, medication management and evaluate for any underlying eating disorder.  Chronic systolic congestive heart failure: Managed with hemodialysis.  Nephrology following. Euvolumic today.  Chronic medical issues including Paroxysmal A-fib, currently in sinus rhythm.  On Coreg and Eliquis. Coronary artery disease, status post stent.  Stable. Hypertension, blood pressure stable. Anemia of chronic disease, hemoglobin stable Hypothyroidism, on thyroxine and is stable.  Type 2 diabetes, hyperglycemia. Sugars are better after increasing dose of Levemir 8 units twice daily.   Since patient is eating, will add prandial insulin 3 units 3 times daily.  Peripheral vascular disease Status post right BKA.  Stable. Status post debridement and fish skin grafting left leg: Local wound care.  Dry dressing.  Prolonged QTc: Unable to use multiple nausea medications.  Chronic stable prolonged QTc.  Among medications, phenergan is reported to be least problematic with prolonged QTc.  Will use very limited quantities. Keep on telemetry monitor. QTc at 530. Repeat EKG 9/2, QTc 521.  DVT prophylaxis:  apixaban (ELIQUIS) tablet 5 mg   Code Status: Full code Family Communication: None at bedside.  Disposition Plan: Status is: Inpatient Remains inpatient appropriate because: Unable to eat, persistent nausea.     Consultants:  Nephrology GI  Procedures:  None  Antimicrobials:  None   Subjective:  Patient seen and examined.  She came back from hemodialysis.  She tells me she has better symptoms today.  She has not used any nausea medicine for last 24 hours.  Patient just finished on her lunch and somehow apprehensive whether she will have recurrent nausea.  No family at the bedside.  Objective: Vitals:   01/27/23 1030  01/27/23 1051  01/27/23 1144 01/27/23 1157  BP: (!) 103/57 (!) 111/52 (!) 95/50 (!) 104/52  Pulse: 68 74 74 77  Resp: 15 14 17 17   Temp:      TempSrc:      SpO2: 100% 100% 100% 100%  Weight:        Intake/Output Summary (Last 24 hours) at 01/27/2023 1431 Last data filed at 01/27/2023 1157 Gross per 24 hour  Intake 0 ml  Output 1400 ml  Net -1400 ml   Filed Weights   01/25/23 0500 01/26/23 0500 01/27/23 0500  Weight: 76.3 kg 76.3 kg 78 kg    Examination:  General exam: Frail.  sick looking.  Comfortable today.  Pleasant to conversation. Respiratory system: No added sounds. Cardiovascular system: S1 & S2 heard, RRR.  Gastrointestinal system: Abdomen is nondistended, soft and nontender. No organomegaly or masses felt. Normal bowel sounds heard. Central nervous system: Alert and oriented. No focal neurological deficits. Extremities: Symmetric 5 x 5 power. Skin:  Right BKA stump clean and dry. Left leg poor peripheral vascular status.  Dry dressing present.    Data Reviewed: I have personally reviewed following labs and imaging studies  CBC: Recent Labs  Lab 01/21/23 0456 01/24/23 0850 01/25/23 0506 01/27/23 0730  WBC 7.3 7.6 8.3 6.7  HGB 10.3* 10.6* 10.4* 9.6*  HCT 34.6* 35.8* 35.1* 32.4*  MCV 74.4* 77.7* 75.8* 78.1*  PLT 285 271 206 198   Basic Metabolic Panel: Recent Labs  Lab 01/22/23 0850 01/22/23 2126 01/23/23 1150 01/24/23 0559 01/25/23 0506 01/26/23 0430 01/27/23 0554  NA 133*  --  131* 129* 133* 129* 130*  K 4.1  --  3.6 4.2 4.5 4.0 4.4  CL 98  --  96* 91* 97* 94* 95*  CO2 22  --  23 21* 22 25 23   GLUCOSE 248*  --  244* 360* 379* 159* 271*  BUN 45*  --  30* 36* 20 29* 51*  CREATININE 4.42*  --  3.71* 4.57* 3.91* 4.51* 5.85*  CALCIUM 8.6*  --  8.4* 8.9 9.1 9.1 8.9  MG  --  1.6*  --  1.9  --   --   --   PHOS 4.6  --  4.6  --   --   --   --    GFR: Estimated Creatinine Clearance: 8.8 mL/min (A) (by C-G formula based on SCr of 5.85 mg/dL (H)). Liver Function  Tests: Recent Labs  Lab 01/21/23 0456 01/22/23 0850 01/23/23 1150  AST 13*  --   --   ALT 14  --   --   ALKPHOS 177*  --   --   BILITOT 0.4  --   --   PROT 5.9*  --   --   ALBUMIN 2.5* 2.5* 2.6*   No results for input(s): "LIPASE", "AMYLASE" in the last 168 hours.  No results for input(s): "AMMONIA" in the last 168 hours. Coagulation Profile: No results for input(s): "INR", "PROTIME" in the last 168 hours. Cardiac Enzymes: No results for input(s): "CKTOTAL", "CKMB", "CKMBINDEX", "TROPONINI" in the last 168 hours. BNP (last 3 results) No results for input(s): "PROBNP" in the last 8760 hours. HbA1C: No results for input(s): "HGBA1C" in the last 72 hours. CBG: Recent Labs  Lab 01/26/23 1132 01/26/23 1636 01/26/23 2020 01/27/23 0720 01/27/23 1322  GLUCAP 151* 311* 397* 283* 142*   Lipid Profile: No results for input(s): "CHOL", "HDL", "LDLCALC", "TRIG", "CHOLHDL", "LDLDIRECT" in the last 72 hours. Thyroid Function Tests: No  results for input(s): "TSH", "T4TOTAL", "FREET4", "T3FREE", "THYROIDAB" in the last 72 hours. Anemia Panel: No results for input(s): "VITAMINB12", "FOLATE", "FERRITIN", "TIBC", "IRON", "RETICCTPCT" in the last 72 hours. Sepsis Labs: No results for input(s): "PROCALCITON", "LATICACIDVEN" in the last 168 hours.  Recent Results (from the past 240 hour(s))  MRSA Next Gen by PCR, Nasal     Status: None   Collection Time: 01/20/23  6:56 PM   Specimen: Nasal Mucosa; Nasal Swab  Result Value Ref Range Status   MRSA by PCR Next Gen NOT DETECTED NOT DETECTED Final    Comment: (NOTE) The GeneXpert MRSA Assay (FDA approved for NASAL specimens only), is one component of a comprehensive MRSA colonization surveillance program. It is not intended to diagnose MRSA infection nor to guide or monitor treatment for MRSA infections. Test performance is not FDA approved in patients less than 23 years old. Performed at [facility], [address]  [place], [place] [address]          Radiology Studies: No results found.      Scheduled Meds:  amitriptyline  25 mg Oral QHS   apixaban  5 mg Oral BID   atorvastatin  80 mg Oral Daily   calcitRIOL  0.75 mcg Oral Daily   carvedilol  6.25 mg Oral BID WC   Chlorhexidine Gluconate Cloth  6 each Topical Q0600   clonazePAM  0.5 mg Oral BID   darbepoetin (ARANESP) injection - DIALYSIS  25 mcg Subcutaneous Q Tue-1800   ferric citrate  420 mg Oral TID WC   folic acid  1 mg Oral Daily   insulin aspart  0-5 Units Subcutaneous QHS   insulin aspart  0-9 Units Subcutaneous TID WC   insulin aspart  3 Units Subcutaneous TID WC   insulin glargine-yfgn  8 Units Subcutaneous BID   levothyroxine  75 mcg Oral QAC breakfast   nystatin  5 mL Oral QID   pantoprazole  40 mg Oral Daily   sucralfate  1 g Oral TID WC & HS   venlafaxine XR  75 mg Oral Q breakfast   Continuous Infusions:  promethazine (PHENERGAN) injection (IM or IVPB) 12.5 mg (01/24/23 1727)      LOS: 7 days    Time spent: 35 minutes    [name], MD [facility]

## 2023-01-27 NOTE — Inpatient Diabetes Management (Signed)
Inpatient Diabetes Program Recommendations  AACE/ADA: New Consensus Statement on Inpatient Glycemic Control (2015)  Target Ranges:  Prepandial:   less than 140 mg/dL      Peak postprandial:   less than 180 mg/dL (1-2 hours)      Critically ill patients:  140 - 180 mg/dL   Lab Results  Component Value Date   GLUCAP 283 (H) 01/27/2023   HGBA1C 7.9 (H) 08/27/2022    Review of Glycemic Control  Latest Reference Range & Units 01/26/23 07:53 01/26/23 11:32 01/26/23 16:36 01/26/23 20:20 01/27/23 07:20  Glucose-Capillary 70 - 99 mg/dL 086 (H) 578 (H) 469 (H) 397 (H) 283 (H)   Diabetes history: DM 2 Outpatient Diabetes medications: Novolog sliding scale: <100 - 0 units, 100-150 - give 5 units, 151-200 -  give 10 units, 201-150 -  give 15 units, >250 -give 18 units, <300  Lantus 13 units, >300 give Lantus 26 units Current orders for Inpatient glycemic control:  Semglee 8 units bid Novolog 0-9 units tid elevated renal function/ESRD dialysis TTS  Endocrinologist: Dr. Talmage Nap last visit less than 1 month ago Sees Endocrinology every 3 months  Note: Postprandial glucose trends increasing  Inpatient Diabetes Program Recommendations:    -   Start Novolog 3 units tid meal coverage if eating >50% of meals (when eating)   Thanks,  Christena Deem RN, MSN, BC-ADM Inpatient Diabetes Coordinator Team Pager [phone] (8a-5p)

## 2023-01-27 NOTE — Consult Note (Signed)
[name] Psychiatry Consult Note   Service Date: January 27, 2023 LOS:  LOS: 7 days  MRN: [id number] Type of consult:  New   Primary Psychiatric Diagnoses  Historical diagnosis of MDD Historical diagnosis of generalized anxiety disorder with panic attacks  Assessment  [name] is a 72 y.o. female with a past psychiatric history of MDD and GAD with panic attacks and significant PMHx of  ESRD on HD, hypothyroidism, morbid obesity, CAD, and a-fib . Psychiatry was consulted for "persistent nausea, no organic cause. psychogenic symptoms?" by [name], MD.   Patient was seen and interviewed while undergoing dialysis treatment. She was profoundly somnolent likely from the physical toll of dialysis treatment but did rouse to voice and answered a few questions. Bedside dialysis nurse confirms patient has been less nauseous and has not received nausea medication lately. Suspect patient's persistent nausea was organic in nature and improved with time and medical treatment.  Patient nodded in the affirmative to questions about depression and anxiety but was too somnolent to respond further to other questions.  Please see plan below for detailed recommendations.   Diagnoses:  Active Hospital problems: Active Problems:   ESRD (end stage renal disease) (HCC)   Intractable nausea and vomiting   Nausea  Plan and Recommendations  ## Interventions (medications, psychoeducation, etc):  -- continue venlafaxine XR 24-hr capsule 75 mg daily for depressive symptoms -- continue clonazepam dissolvable tablet 0.5 mg twice daily for anxiety -- continue amitriptyline 25 mg at bedtime for insomnia -- recommend outpatient psychiatry follow-up  ## Further Work-up:  -- none  -- most recent EKG on 521 had QtC of 01/26/2023 -- Pertinent labwork reviewed earlier this admission includes: CBC, CMP  ## Medical Decision Making Capacity:  - Not formally assessed during this encounter  ##  Disposition:  - No indication for inpatient psychiatric admission. Recommending outpatient psychiatry / psychology follow-up. Defer immediate disposition plan to primary team.  ## Behavioral / Environmental:  -- Standard delirium precautions and Fall precautions  ##Legal Status -- Patient voluntary  Thank you for this consult request. Our recommendations are listed above. We will continue to follow patient's hospital course  ## Safety and Observation Level:  - Based on my clinical evaluation, I estimate the patient to be at low risk of self harm in the current setting - At this time, we recommend a no additional level of observation. This decision is based on my review of the chart including patient's history and current presentation, interview of the patient, mental status examination, and consideration of suicide risk including evaluating suicidal ideation, plan, intent, suicidal or self-harm behaviors, risk factors, and protective factors. This judgment is based on our ability to directly address suicide risk, implement suicide prevention strategies and develop a safety plan while the patient is in the clinical setting. Please contact our team if there is a concern that risk level has changed.  [name], MD  History Obtained on Initial Interview  Relevant Aspects of Hospital Course:  Admitted on 01/20/2023 for recurrent n/v and inability to take PO.  Patient Report:  Patient was seen in dialysis unit receiving HD. She was profoundly somnolent on interview but did rouse to voice after I called her name a third time. I asked patient if she was still vomiting or feeling nauseous, to which she responded, "no I haven't been feeling nauseous anymore." I informed patient we were consulted by her primary team to investigate her nausea to which she nodded her head.  I asked patient if she had any other psychiatric needs but she did not respond and dozed off to sleep. I asked patient if she felt  depressed and she nodded her head. I also asked if she felt anxious and she also nodded her head. I asked patient to tell me more about her depression and anxiety but she did not respond, dozing off to sleep once more.  I terminated the interview at this point as patient was too somnolent to participate further.  Collateral information obtained Scherrie Bateman, patient's bedside dialysis nurse) [name] confirms patient has no longer been vomiting nor requiring as-needed medications for nausea. She shares she is familiar with patient as she has taken care of her multiples times in the past, adding that during this admission she feels patient's anxiety appears much better controlled but does note patient appears more tired and withdrawn compared to prior encounters.  Psychiatric ROS:  Depression: nodded in affirmative, could not elaborate Anxiety: nodded in affirmative, could not elaborate  Psychiatric and Social History   Psychiatric History  Information collected from patient, collateral contact/s, and available chart review  Prev Dx/Sx: Depression, anxiety Current Psych Provider: PCP Home Meds (current): trintillex 20 mg, lorazepam 0.5 mg QHS Previous Med Trials: lexapro, zoloft, prozac (briefly effective), wellbutrin (unclear efficacy).  Therapy: no     Prior Psych Hospitalization: no  Prior Self Harm: no Prior Violence: no   Family Psych History: Son attempted suicide via GSW to head, now disabled.      Social History:   Educational Hx: high school Occupational Hx: office Legal Hx: no Living Situation: with husband of 24 years, son 3 days/week Spiritual Hx: yes, but not religious Access to weapons: yes - has guns in bedroom   Substance History Tobacco use: quit 20+ y ears ago Alcohol use: no Drug use: no   Other Histories  These are pulled from EMR and updated if appropriate.   Family History:  The patient's family history includes Heart attack (age of onset: 89)  in her mother.  Medical History: Past Medical History:  Diagnosis Date   Anemia    hx   Anxiety    Arthritis    "generalized" (03/15/2014)   CAD (coronary artery disease)    MI in 2000 - MI  2007 - treated bare metal stent (no nuclear since then as 9/11)   Carotid artery disease (HCC)    Chronic diastolic heart failure (HCC)    a) ECHO (08/2013) EF 55-60% and RV function nl b) RHC (08/2013) RA 4, RV 30/5/7, PA 25/10 (16), PCWP 7, Fick CO/CI 6.3/2.7, PVR 1.5 WU, PA 61 and 66%   Daily headache    "~ every other day; since I fell in June" (03/15/2014)   Depression    Diabetic retinopathy (HCC)    Dyslipidemia    ESRD (end stage renal disease) (HCC)    Dialysis on Tues Thurs Sat   Exertional shortness of breath    History of kidney stones    HTN (hypertension)    Hypothyroidism    Obesity    Osteoarthritis    PAF (paroxysmal atrial fibrillation) (HCC)    Peripheral neuropathy    bilateral feet/hands   PONV (postoperative nausea and vomiting)    RBBB (right bundle branch block)    Old   Stroke (HCC)    mini strokes   Type II diabetes mellitus (HCC)    Type II, [name]. patient has omnipod insulin pump with Novolin  R Insulin    Surgical History: Past Surgical History:  Procedure Laterality Date   A/V FISTULAGRAM Left 11/07/2022   Procedure: A/V Fistulagram;  Surgeon: Leonie Douglas, MD;  [facility]: [facility];  Service: Cardiovascular;  Laterality: Left;   ABDOMINAL HYSTERECTOMY  1980's   AMPUTATION Right 02/24/2018   Procedure: RIGHT FOOT GREAT TOE AND 2ND TOE AMPUTATION;  Surgeon: Nadara Mustard, MD;  Location: [facility] OR;  Service: Orthopedics;  Laterality: Right;   AMPUTATION Right 04/30/2018   Procedure: RIGHT TRANSMETATARSAL AMPUTATION;  Surgeon: Nadara Mustard, MD;  Location: [facility] OR;  Service: Orthopedics;  Laterality: Right;   AMPUTATION Right 05/02/2022   Procedure: RIGHT BELOW KNEE AMPUTATION;  Surgeon: Nadara Mustard, MD;  Location: [facility] OR;   Service: Orthopedics;  Laterality: Right;   APPLICATION OF WOUND VAC Right 06/13/2022   Procedure: APPLICATION OF WOUND VAC;  Surgeon: Nadara Mustard, MD;  Location: [facility] OR;  Service: Orthopedics;  Laterality: Right;   APPLICATION OF WOUND VAC Left 11/14/2022   Procedure: APPLICATION OF WOUND VAC;  Surgeon: Nadara Mustard, MD;  Location: [facility] OR;  Service: Orthopedics;  Laterality: Left;   AV FISTULA PLACEMENT Left 04/02/2022   Procedure: LEFT ARM ARTERIOVENOUS (AV) FISTULA CREATION;  Surgeon: Nada Libman, MD;  Location: [facility] OR;  Service: Vascular;  Laterality: Left;  PERIPHERAL NERVE BLOCK   BASCILIC VEIN TRANSPOSITION Left 07/31/2022   Procedure: LEFT ARM SECOND STAGE BASILIC VEIN TRANSPOSITION;  Surgeon: Nada Libman, MD;  [facility]: [facility];  Service: Vascular;  Laterality: Left;   BIOPSY  05/27/2020   Procedure: BIOPSY;  Surgeon: Lanelle Bal, DO;  [facility]: [facility];  Service: Endoscopy;;   CATARACT EXTRACTION, BILATERAL Bilateral ?2013   COLONOSCOPY W/ POLYPECTOMY     COLONOSCOPY WITH PROPOFOL N/A 03/13/2019   Procedure: COLONOSCOPY WITH PROPOFOL;  Surgeon: Beverley Fiedler, MD;  Location: [facility] ENDOSCOPY;  Service: Gastroenterology;  Laterality: N/A;   CORONARY ANGIOPLASTY WITH STENT PLACEMENT  1999; 2007   "1 + 1"   ERCP N/A 02/03/2022   Procedure: ENDOSCOPIC RETROGRADE CHOLANGIOPANCREATOGRAPHY (ERCP);  Surgeon: Jeani Hawking, MD;  Location: [facility] ENDOSCOPY;  Service: Gastroenterology;  Laterality: N/A;   ESOPHAGOGASTRODUODENOSCOPY (EGD) WITH PROPOFOL N/A 03/13/2019   Procedure: ESOPHAGOGASTRODUODENOSCOPY (EGD) WITH PROPOFOL;  Surgeon: Beverley Fiedler, MD;  Location: [facility] ENDOSCOPY;  Service: Gastroenterology;  Laterality: N/A;   ESOPHAGOGASTRODUODENOSCOPY (EGD) WITH PROPOFOL N/A 05/27/2020   Procedure: ESOPHAGOGASTRODUODENOSCOPY (EGD) WITH PROPOFOL;  Surgeon: Lanelle Bal, DO;  [facility];  Service: Endoscopy;  Laterality: N/A;   ESOPHAGOGASTRODUODENOSCOPY (EGD) WITH PROPOFOL  N/A 09/03/2022   Procedure: ESOPHAGOGASTRODUODENOSCOPY (EGD) WITH PROPOFOL;  Surgeon: Jeani Hawking, MD;  Location: [facility] ENDOSCOPY;  Service: Gastroenterology;  Laterality: N/A;   EYE SURGERY Bilateral    lazer   FLEXIBLE SIGMOIDOSCOPY N/A 05/23/2022   Procedure: FLEXIBLE SIGMOIDOSCOPY;  Surgeon: Jeani Hawking, MD;  Location: [facility] ENDOSCOPY;  Service: Gastroenterology;  Laterality: N/A;   FLEXIBLE SIGMOIDOSCOPY N/A 05/24/2022   Procedure: FLEXIBLE SIGMOIDOSCOPY;  Surgeon: Imogene Burn, MD;  Location: [facility] ENDOSCOPY;  Service: Gastroenterology;  Laterality: N/A;   FLEXIBLE SIGMOIDOSCOPY N/A 09/03/2022   Procedure: FLEXIBLE SIGMOIDOSCOPY;  Surgeon: Jeani Hawking, MD;  Location: [facility] ENDOSCOPY;  Service: Gastroenterology;  Laterality: N/A;   HEMOSTASIS CLIP PLACEMENT  03/13/2019   Procedure: HEMOSTASIS CLIP PLACEMENT;  Surgeon: Beverley Fiedler, MD;  Location: [facility] ENDOSCOPY;  Service: Gastroenterology;;   HEMOSTASIS CLIP PLACEMENT  05/23/2022   Procedure: HEMOSTASIS CLIP PLACEMENT;  Surgeon: Elnoria Howard,  Luisa Hart, MD;  Location: [facility] ENDOSCOPY;  Service: Gastroenterology;;   HEMOSTASIS CONTROL  05/24/2022   Procedure: HEMOSTASIS CONTROL;  Surgeon: Imogene Burn, MD;  Location: [facility] ENDOSCOPY;  Service: Gastroenterology;;   HOT HEMOSTASIS N/A 05/23/2022   Procedure: HOT HEMOSTASIS (ARGON PLASMA COAGULATION/BICAP);  Surgeon: Jeani Hawking, MD;  Location: [facility] ENDOSCOPY;  Service: Gastroenterology;  Laterality: N/A;   I & D EXTREMITY Left 05/05/2022   Procedure: IRRIGATION AND DEBRIDEMENT LEFT ARM AV FISTULA;  Surgeon: Cephus Shelling, MD;  Location: [facility] OR;  Service: Vascular;  Laterality: Left;   I & D EXTREMITY N/A 11/14/2022   Procedure: IRRIGATION AND DEBRIDEMENT OF LOWER EXTREMITY WOUND;  Surgeon: Nadara Mustard, MD;  [facility];  Service: Orthopedics;  Laterality: N/A;   INSERTION OF DIALYSIS CATHETER Right 04/02/2022   Procedure: INSERTION OF TUNNELED DIALYSIS CATHETER;  Surgeon: Nada Libman, MD;   Location: [facility] OR;  Service: Vascular;  Laterality: Right;   KNEE ARTHROSCOPY Left 10/25/2006   POLYPECTOMY  03/13/2019   Procedure: POLYPECTOMY;  Surgeon: Beverley Fiedler, MD;  [facility] ENDOS[facility];  Service: Gastroenterology;;   REMOVAL OF STONES  02/03/2022   Procedure: REMOVAL OF STONES;  Surgeon: Jeani Hawking, MD;  Location: [facility] ENDOSCOPY;  Service: Gastroenterology;;   REVISON OF ARTERIOVENOUS FISTULA Left 08/20/2022   Procedure: REVISON OF LEFT ARM ARTERIOVENOUS FISTULA;  Surgeon: Nada Libman, MD;  [facility];  Service: Vascular;  Laterality: Left;   RIGHT HEART CATH N/A 07/24/2017   Procedure: RIGHT HEART CATH;  Surgeon: Dolores Patty, MD;  [facility];  Service: Cardiovascular;  Laterality: N/A;   RIGHT HEART CATHETERIZATION N/A 09/22/2013   Procedure: RIGHT HEART CATH;  Surgeon: Dolores Patty, MD;  Location: [facility] CATH LAB;  Service: Cardiovascular;  Laterality: N/A;   SHOULDER ARTHROSCOPY WITH OPEN ROTATOR CUFF REPAIR Right 03/14/2014   Procedure: RIGHT SHOULDER ARTHROSCOPY WITH BICEPS RELEASE, OPEN SUBSCAPULA REPAIR, OPEN SUPRASPINATUS REPAIR.;  Surgeon: Cammy Copa, MD;  Location: [facility] OR;  Service: Orthopedics;  Laterality: Right;   SPHINCTEROTOMY  02/03/2022   Procedure: SPHINCTEROTOMY;  Surgeon: Jeani Hawking, MD;  Location: [facility] ENDOSCOPY;  Service: Gastroenterology;;   STUMP REVISION Right 06/13/2022   Procedure: REVISION RIGHT BELOW KNEE AMPUTATION;  Surgeon: Nadara Mustard, MD;  Location: [facility] OR;  Service: Orthopedics;  Laterality: Right;   TEE WITHOUT CARDIOVERSION N/A 02/04/2022   Procedure: TRANSESOPHAGEAL ECHOCARDIOGRAM (TEE);  Surgeon: Dolores Patty, MD;  Location: [facility] ENDOSCOPY;  Service: Cardiovascular;  Laterality: N/A;   THROMBECTOMY W/ EMBOLECTOMY Left 08/20/2022   Procedure: THROMBECTOMY OF LEFT ARM ARTERIOVENOUS FISTULA;  Surgeon: Nada Libman, MD;  [facility] OR;  Service: Vascular;  Laterality: Left;   TOE AMPUTATION Right  02/24/2018   GREAT TOE AND 2ND TOE AMPUTATION   TUBAL LIGATION  1970's    Medications:   Current Facility-Administered Medications:    acetaminophen (TYLENOL) tablet 650 mg, 650 mg, Oral, Q6H PRN **OR** acetaminophen (TYLENOL) suppository 650 mg, 650 mg, Rectal, Q6H PRN, Lurline Del, MD   albuterol (PROVENTIL) (2.5 MG/3ML) 0.083% nebulizer solution 2.5 mg, 2.5 mg, Nebulization, Q2H PRN, Skip Mayer A, MD   alteplase (CATHFLO ACTIVASE) injection 2 mg, 2 mg, Intracatheter, Once PRN, Thomasena Edis, Samantha G, PA-C   amitriptyline (ELAVIL) tablet 25 mg, 25 mg, Oral, QHS, Ghimire, Kuber, MD, 25 mg at 01/26/23 2126   anticoagulant sodium citrate solution 5 mL, 5 mL, Intracatheter, PRN, Thomasena Edis, Samantha G, PA-C   apixaban (ELIQUIS) tablet 5  mg, 5 mg, Oral, BID, Vallery Sa C, MD, 5 mg at 01/26/23 2126   atorvastatin (LIPITOR) tablet 80 mg, 80 mg, Oral, Daily, Skip Mayer A, MD, 80 mg at 01/26/23 9833   calcitRIOL (ROCALTROL) capsule 0.75 mcg, 0.75 mcg, Oral, Daily, Vallery Sa C, MD, 0.75 mcg at 01/26/23 0934   carvedilol (COREG) tablet 6.25 mg, 6.25 mg, Oral, BID WC, Skip Mayer A, MD, 6.25 mg at 01/26/23 1633   Chlorhexidine Gluconate Cloth 2 % PADS 6 each, 6 each, Topical, Q0600, Estanislado Emms, MD, 6 each at 01/26/23 0934   clonazePAM (KLONOPIN) disintegrating tablet 0.5 mg, 0.5 mg, Oral, BID, Skip Mayer A, MD, 0.5 mg at 01/26/23 2126   Darbepoetin Alfa (ARANESP) injection 25 mcg, 25 mcg, Subcutaneous, Q Tue-1800, Collins, Samantha G, PA-C   ferric citrate (AURYXIA) tablet 420 mg, 420 mg, Oral, TID WC, Estanislado Emms, MD, 420 mg at 01/26/23 1632   folic acid (FOLVITE) tablet 1 mg, 1 mg, Oral, Daily, Skip Mayer A, MD, 1 mg at 01/26/23 1237   heparin injection 1,000 Units, 1,000 Units, Intracatheter, PRN, Thomasena Edis, Samantha G, PA-C   insulin aspart (novoLOG) injection 0-5 Units, 0-5 Units, Subcutaneous, QHS, Gardner, Jared M, DO, 5 Units at 01/26/23 2126    insulin aspart (novoLOG) injection 0-9 Units, 0-9 Units, Subcutaneous, TID WC, Hillary Bow, DO, 5 Units at 01/27/23 0742   insulin glargine-yfgn [facility]) injection 8 Units, 8 Units, Subcutaneous, BID, [name], MD, 8 Units at 01/26/23 2127   levothyroxine (SYNTHROID) tablet 75 mcg, 75 mcg, Oral, QAC breakfast, Skip Mayer A, MD, 75 mcg at 01/27/23 0531   LORazepam (ATIVAN) injection 0.5 mg, 0.5 mg, Intravenous, Q6H PRN, Julian Reil, Jared M, DO, 0.5 mg at 01/25/23 1600   nystatin (MYCOSTATIN) 100000 UNIT/ML suspension 500,000 Units, 5 mL, Oral, QID, Jeani Hawking, MD, 500,000 Units at 01/26/23 2126   pantoprazole (PROTONIX) EC tablet 40 mg, 40 mg, Oral, Daily, Paytes, Austin A, RPH, 40 mg at 01/26/23 0933   promethazine (PHENERGAN) 12.5 mg in sodium chloride 0.9 % 50 mL IVPB, 12.5 mg, Intravenous, Q6H PRN, [name], MD, Last Rate: 200 mL/hr at 01/24/23 1727, 12.5 mg at 01/24/23 1727   sucralfate (CARAFATE) 1 GM/10ML suspension 1 g, 1 g, Oral, TID WC & HS, Skip Mayer A, MD, 1 g at 01/26/23 2126   venlafaxine XR (EFFEXOR-XR) 24 hr capsule 75 mg, 75 mg, Oral, Q breakfast, [name], MD, 75 mg at 01/26/23 8250  Allergies: Allergies  Allergen Reactions   Cephalexin Diarrhea and Other (See Comments)   Codeine Nausea And Vomiting and Other (See Comments)    Exam Findings  Vital signs:  Temp:  [97.3 F (36.3 C)-98.1 F (36.7 C)] 97.3 F (36.3 C) (09/03 0807) Pulse Rate:  [66-74] 69 (09/03 0930) Resp:  [17-20] 18 (09/03 0930) BP: (90-118)/(36-61) 94/50 (09/03 0930) SpO2:  [99 %-100 %] 99 % (09/03 0930) Weight:  [78 kg] 78 kg (09/03 0500)  Psychiatric Specialty Exam:  Presentation  General Appearance: -- (profoundly somnolent)  Eye Contact:Absent  Speech:Clear and Coherent; Normal Rate  Speech Volume:Normal  Handedness:Right   Mood and Affect  Mood:-- (unable to assess)  Affect:-- (unable to assess)   Thought Process  Thought Processes:--  (unable to assess)  Descriptions of Associations:-- (unable to assess)  Orientation:-- (not formally assessed)  Thought Content:-- (unable to assess)  History of Schizophrenia/Schizoaffective disorder:No data recorded Duration of Psychotic Symptoms:No data recorded Hallucinations:Hallucinations: -- (unable to assess)  Ideas of Reference:-- (unable to  assess)  Suicidal Thoughts:Suicidal Thoughts: -- (unable to assess)  Homicidal Thoughts:Homicidal Thoughts: -- (unable to assess)   Sensorium  Memory:-- (unable to assess)  Judgment:-- (unable to assess)  Insight:-- (unable to assess)   Executive Functions  Concentration:Poor  Attention Span:-- (unable to assess)  Recall:-- (unable to assess)  Fund of Knowledge:-- (unable to assess)  Language:Good   Psychomotor Activity  Psychomotor Activity:Psychomotor Activity: -- (unable to assess)   Assets  Assets:Resilience   Sleep  Sleep:Sleep: -- (unable to assess)   Physical Exam: Physical Exam Vitals and nursing note reviewed.  Constitutional:      Comments: Profoundly somnolent  HENT:     Head: Normocephalic and atraumatic.  Pulmonary:     Effort: Pulmonary effort is normal.    Blood pressure (!) 94/50, pulse 69, temperature (!) 97.3 F (36.3 C), resp. rate 18, weight 78 kg, SpO2 99%. Body mass index is 28.62 kg/m.

## 2023-01-27 NOTE — Progress Notes (Signed)
Subjective: Vomiting is minimal, but nausea persists.  Overall these symptoms improved.  Objective: Vital signs in last 24 hours: Temp:  [97.3 F (36.3 C)-98.1 F (36.7 C)] 97.3 F (36.3 C) (09/03 0807) Pulse Rate:  [66-77] 77 (09/03 1157) Resp:  [14-20] 17 (09/03 1157) BP: (84-118)/(36-61) 104/52 (09/03 1157) SpO2:  [99 %-100 %] 100 % (09/03 1157) Weight:  [78 kg] 78 kg (09/03 0500) Last BM Date : 01/25/23  Intake/Output from previous day: 09/02 0701 - 09/03 0700 In: 0  Out: 300 [Urine:300] Intake/Output this shift: Total I/O In: -  Out: 1100 [Other:1100]  General appearance: alert and no distress GI: soft, non-tender; bowel sounds normal; no masses,  no organomegaly  Lab Results: Recent Labs    01/25/23 0506 01/27/23 0730  WBC 8.3 6.7  HGB 10.4* 9.6*  HCT 35.1* 32.4*  PLT 206 198   BMET Recent Labs    01/25/23 0506 01/26/23 0430 01/27/23 0554  NA 133* 129* 130*  K 4.5 4.0 4.4  CL 97* 94* 95*  CO2 22 25 23   GLUCOSE 379* 159* 271*  BUN 20 29* 51*  CREATININE 3.91* 4.51* 5.85*  CALCIUM 9.1 9.1 8.9   LFT No results for input(s): "PROT", "ALBUMIN", "AST", "ALT", "ALKPHOS", "BILITOT", "BILIDIR", "IBILI" in the last 72 hours. PT/INR No results for input(s): "LABPROT", "INR" in the last 72 hours. Hepatitis Panel No results for input(s): "HEPBSAG", "HCVAB", "HEPAIGM", "HEPBIGM" in the last 72 hours. C-Diff No results for input(s): "CDIFFTOX" in the last 72 hours. Fecal Lactopherrin No results for input(s): "FECLLACTOFRN" in the last 72 hours.  Studies/Results: No results found.  Medications: Scheduled:  amitriptyline  25 mg Oral QHS   apixaban  5 mg Oral BID   atorvastatin  80 mg Oral Daily   calcitRIOL  0.75 mcg Oral Daily   carvedilol  6.25 mg Oral BID WC   Chlorhexidine Gluconate Cloth  6 each Topical Q0600   clonazePAM  0.5 mg Oral BID   darbepoetin (ARANESP) injection - DIALYSIS  25 mcg Subcutaneous Q Tue-1800   ferric citrate  420 mg Oral TID  WC   folic acid  1 mg Oral Daily   insulin aspart  0-5 Units Subcutaneous QHS   insulin aspart  0-9 Units Subcutaneous TID WC   insulin aspart  3 Units Subcutaneous TID WC   insulin glargine-yfgn  8 Units Subcutaneous BID   levothyroxine  75 mcg Oral QAC breakfast   melatonin  3 mg Oral QHS   nystatin  5 mL Oral QID   pantoprazole  40 mg Oral Daily   sucralfate  1 g Oral TID WC & HS   venlafaxine XR  75 mg Oral Q breakfast   Continuous:  promethazine (PHENERGAN) injection (IM or IVPB) 12.5 mg (01/24/23 1727)    Assessment/Plan: 1) Chronic nausea. 2) Vomiting - markedly improved. 3) ESRD.   Her nausea is chronic.  It does tend to wax an wane and this may be with her ESRD.  At this time she is stable from the GI standpoint and it does not appear that any other work up is required at this time.  Plan: 1) Supportive care. 2) Signing off.  LOS: 7 days   Ferguson Gertner D 01/27/2023, 3:29 PM

## 2023-01-27 NOTE — Procedures (Signed)
HD Note:  Some information was entered later than the data was gathered due to patient care needs. The stated time with the data is accurate.  Received patient in bed to unit.   Alert and oriented.   Informed consent signed and in chart.   Access used: Upper right chest HD catheter Access issues: None  Patient had episode of SBP of 84 and UF was turned off.  Patient stated that she felt fine. Patient continued to have hypotensive issues resulting in the UF goal not being met.  TX duration: 3.25  Alert, without acute distress.  Total UF removed: 1100 ml  Hand-off given to patient's nurse.   Transported back to the room   Xuan Mateus L. Dareen Piano, RN Kidney Dialysis Unit.

## 2023-01-27 NOTE — Evaluation (Addendum)
Occupational Therapy Evaluation Patient Details Name: Susan [name] MRN: [id number] DOB: [birth year] Today's Date: 01/27/2023   History of Present Illness Susan [name] is a 72 y.o. female who now returns to ED with recurrent n/v and inability to take anything po. Possible gastroparesis or remaining symptoms from previous episode. May have a component of anxiety/depression. PHMx: sCHF EF 30-35%, ESRD on HD TThS, PVD s/p BKA, wheelchair limited mostly, hypothyroidism, morbid obesity, CAD DM and Afib on Eliquis who has interim history of admission  8/22-8/24  at which time she presented with  N/V/D and was found to have proctocolitis and well as early DKA. Patient was started on insulin drip and iv antibiotics. Patient stabilized and was discharged on cephalexin to complete course.   Clinical Impression   This 72 yo female admitted with above presents to acute OT with PLOF of being able Mod A to get up to EOB, min A to do lateral transfer with board, setup in wheelchair for UBB/D and Mod-max A for LBB/D from wheelchair. Today she was Mod A to get up to EOB with HOB up and use of rail and CGA to sit EOB and maintain balance. She will continue to benefit from acute OT with follow up HHOT and 24 hour S with prn A.      If plan is discharge home, recommend the following: A lot of help with walking and/or transfers;A lot of help with bathing/dressing/bathroom;Assistance with cooking/housework;Help with stairs or ramp for entrance;Assist for transportation    Functional Status Assessment  Patient has had a recent decline in their functional status and demonstrates the ability to make significant improvements in function in a reasonable and predictable amount of time.  Equipment Recommendations  None recommended by OT       Precautions / Restrictions Precautions Precautions: Fall Restrictions Weight Bearing Restrictions: Yes LLE Weight Bearing: Non weight bearing Other Position/Activity  Restrictions: per pt she reports Dr. Lajoyce Corners does not want her to put any weight on her LLE due to skin graft      Mobility Bed Mobility Overal bed mobility: Needs Assistance Bed Mobility: Supine to Sit, Sit to Supine     Supine to sit: Mod assist, HOB elevated, Used rails Sit to supine: Min assist, Used rails            Balance Overall balance assessment: Needs assistance Sitting-balance support: Bilateral upper extremity supported, No upper extremity supported, Feet supported Sitting balance-Leahy Scale: Fair                                     ADL either performed or assessed with clinical judgement   ADL Overall ADL's : Needs assistance/impaired Eating/Feeding: Independent Eating/Feeding Details (indicate cue type and reason): supported sitting Grooming: Set up Grooming Details (indicate cue type and reason): supported sitting Upper Body Bathing: Set up Upper Body Bathing Details (indicate cue type and reason): supported sitting Lower Body Bathing: Moderate assistance;Sitting/lateral leans   Upper Body Dressing : Set up Upper Body Dressing Details (indicate cue type and reason): supported sitting Lower Body Dressing: Maximal assistance;Sitting/lateral leans     Toilet Transfer Details (indicate cue type and reason): uses briefs Toileting- Clothing Manipulation and Hygiene: Total assistance;Bed level               Vision Patient Visual Report: No change from baseline  Pertinent Vitals/Pain Pain Assessment Pain Assessment: 0-10 Pain Score: 10-Worst pain ever Pain Location: left foot when it touches the ground as she sits at EOB Pain Descriptors / Indicators: Pins and needles Pain Intervention(s): Limited activity within patient's tolerance, Monitored during session, Repositioned     Extremity/Trunk Assessment Upper Extremity Assessment Upper Extremity Assessment: Overall WFL for tasks assessed, pt does have old issues with  right shoulder           Communication Communication Communication: No apparent difficulties   Cognition Arousal: Alert Behavior During Therapy: WFL for tasks assessed/performed Overall Cognitive Status: Within Functional Limits for tasks assessed                                                  Home Living Family/patient expects to be discharged to:: Private residence Living Arrangements: Spouse/significant other Available Help at Discharge: Family;Available 24 hours/day Type of Home: Mobile home Home Access: Ramped entrance (not ADA compliant, steep)     Home Layout: One level     Bathroom Shower/Tub: Tub/shower unit;Door     Bathroom Accessibility: Yes How Accessible: Accessible via walker Home Equipment: Grab bars - tub/shower;Rolling Walker (2 wheels);Rollator (4 wheels);BSC/3in1;Cane - quad;Shower seat;Wheelchair - manual   Additional Comments: Husband is out on FMLA until Sept (pt reports maybe longer).She is scheduled to get RLE prosthesis 02/04/2023. 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  Using brief to have BM and husband A with cleaning        OT Problem List: Impaired balance (sitting and/or standing);Impaired sensation;Pain      OT Treatment/Interventions: Self-care/ADL training;DME and/or AE instruction;Balance training;Patient/family education    OT Goals(Current goals can be found in the care plan section) Acute Rehab OT Goals Patient Stated Goal: to go home tomorrow OT Goal Formulation: With patient Time For Goal Achievement: 02/10/23 Potential to Achieve Goals: Good  OT Frequency: Min 1X/week        AM-PAC OT "6 Clicks" Daily Activity     Outcome Measure Help from another person eating meals?: None Help from another person taking care of personal grooming?: A Little Help from another person toileting, which includes using toliet, bedpan, or urinal?: Total Help from another person bathing (including washing, rinsing, drying)?: A Lot Help from another person to put on and taking off regular upper body clothing?: A Little Help from another person to put on and taking off regular lower body clothing?: Total 6 Click Score: 14   End of Session    Activity Tolerance: Patient tolerated treatment well Patient left: in bed;with call bell/phone within reach;with bed alarm set  OT Visit Diagnosis: Other abnormalities of gait and mobility (R26.89);Muscle weakness (generalized) (M62.81);Pain Pain - Right/Left: Left Pain - part of body: Ankle and joints of foot                Time: 1530-1549 OT Time Calculation (min): 19 min Charges:  OT General Charges $OT Visit: 1 Visit OT Evaluation $OT Eval  Moderate Complexity: 1 Mod  Cathy L. OT Acute Rehabilitation Services Office [phone]    Evette Georges 01/27/2023, 4:56 PM

## 2023-01-27 NOTE — TOC Progression Note (Signed)
Transition of Care [facility]) - Progression Note    Patient Details  Name: Susan [name] MRN: [id number] Date of Birth: Apr 18, 1951  [facility] CM/SW Contact  Tom-Johnson, Hershal Coria, RN Phone Number: 01/27/2023, 2:31 PM  Clinical Narrative:     Patient continues to be Lethargic with poor Oral intake. States Nausea improving today. Nephrology following for inpatient HD. GI following for Candida Esophagitis.   Patient not Medically ready for discharge.  CM will continue to follow as patient progresses with care towards discharge.       Expected Discharge Plan and Services                                               Social Determinants of Health (SDOH) Interventions SDOH Screenings   Food Insecurity: No Food Insecurity (01/15/2023)  Housing: Low Risk  (01/15/2023)  Transportation Needs: No Transportation Needs (01/15/2023)  Utilities: Not At Risk (01/15/2023)  Alcohol Screen: Low Risk  (08/16/2021)  Depression (PHQ2-9): Low Risk  (09/29/2022)  Financial Resource Strain: Low Risk  (08/16/2021)  Physical Activity: Inactive (08/16/2021)  Social Connections: Moderately Isolated (08/16/2021)  Stress: No Stress Concern Present (08/16/2021)  Tobacco Use: Medium Risk (01/20/2023)    Readmission Risk Interventions    01/21/2023    3:09 PM 09/04/2022    5:11 PM 05/12/2022    5:04 PM  Readmission Risk Prevention Plan  Transportation Screening Complete Complete Complete  Medication Review (RN Care Manager) Referral to Pharmacy Complete   PCP or Specialist appointment within 3-5 days of discharge Complete Complete   HRI or Home Care Consult Complete Complete   SW Recovery Care/Counseling Consult Complete Complete Complete  Palliative Care Screening Not Applicable Not Applicable   Skilled Nursing Facility Not Applicable Complete Complete

## 2023-01-27 NOTE — Progress Notes (Signed)
[facility] Progress Note   Subjective:   Seen on HD. Reports her nausea is better this AM. Denies SOB, CP and dizziness.   Objective Vitals:   01/27/23 0500 01/27/23 0719 01/27/23 0807 01/27/23 0812  BP:  (!) 116/55 103/61 (!) 104/58  Pulse:  74 73 72  Resp:   17 17  Temp:  98.1 F (36.7 C) (!) 97.3 F (36.3 C)   TempSrc:  Oral    SpO2:  100% 100% 100%  Weight: 78 kg      Physical Exam General: Chronically ill appearing female, alert and in NAD Heart: RRR, no murmurs, rubs or gallops Lungs: CTA bilaterally, respirations unlabored, on O2 via [place] Abdomen: Soft, +BS Extremities: R BKA, trace pedal edema L leg Dialysis Access: R internal jugular TDC accessed  Additional Objective Labs: Basic Metabolic Panel: Recent Labs  Lab 01/22/23 0850 01/23/23 1150 01/24/23 0559 01/25/23 0506 01/26/23 0430 01/27/23 0554  NA 133* 131*   < > 133* 129* 130*  K 4.1 3.6   < > 4.5 4.0 4.4  CL 98 96*   < > 97* 94* 95*  CO2 22 23   < > 22 25 23   GLUCOSE 248* 244*   < > 379* 159* 271*  BUN 45* 30*   < > 20 29* 51*  CREATININE 4.42* 3.71*   < > 3.91* 4.51* 5.85*  CALCIUM 8.6* 8.4*   < > 9.1 9.1 8.9  PHOS 4.6 4.6  --   --   --   --    < > = values in this interval not displayed.   Liver Function Tests: Recent Labs  Lab 01/21/23 0456 01/22/23 0850 01/23/23 1150  AST 13*  --   --   ALT 14  --   --   ALKPHOS 177*  --   --   BILITOT 0.4  --   --   PROT 5.9*  --   --   ALBUMIN 2.5* 2.5* 2.6*   No results for input(s): "LIPASE", "AMYLASE" in the last 168 hours. CBC: Recent Labs  Lab 01/21/23 0456 01/24/23 0850 01/25/23 0506  WBC 7.3 7.6 8.3  HGB 10.3* 10.6* 10.4*  HCT 34.6* 35.8* 35.1*  MCV 74.4* 77.7* 75.8*  PLT 285 271 206   Blood Culture    Component Value Date/Time   SDES TISSUE 11/14/2022 0919   SPECREQUEST NONE 11/14/2022 0919   CULT  11/14/2022 0919    FEW PROTEUS MIRABILIS RARE KLEBSIELLA PNEUMONIAE NO ANAEROBES ISOLATED FEW ENTEROCOCCUS  FAECALIS    REPTSTATUS 11/19/2022 FINAL 11/14/2022 0919    Cardiac Enzymes: No results for input(s): "CKTOTAL", "CKMB", "CKMBINDEX", "TROPONINI" in the last 168 hours. CBG: Recent Labs  Lab 01/26/23 0753 01/26/23 1132 01/26/23 1636 01/26/23 2020 01/27/23 0720  GLUCAP 177* 151* 311* 397* 283*   Iron Studies: No results for input(s): "IRON", "TIBC", "TRANSFERRIN", "FERRITIN" in the last 72 hours. @lablastinr3 @ Studies/Results: No results found. Medications:  anticoagulant sodium citrate     promethazine (PHENERGAN) injection (IM or IVPB) 12.5 mg (01/24/23 1727)    amitriptyline  25 mg Oral QHS   apixaban  5 mg Oral BID   atorvastatin  80 mg Oral Daily   calcitRIOL  0.75 mcg Oral Daily   carvedilol  6.25 mg Oral BID WC   Chlorhexidine Gluconate Cloth  6 each Topical Q0600   clonazePAM  0.5 mg Oral BID   ferric citrate  420 mg Oral TID WC   folic acid  1 mg  Oral Daily   insulin aspart  0-5 Units Subcutaneous QHS   insulin aspart  0-9 Units Subcutaneous TID WC   insulin glargine-yfgn  8 Units Subcutaneous BID   levothyroxine  75 mcg Oral QAC breakfast   nystatin  5 mL Oral QID   pantoprazole  40 mg Oral Daily   sucralfate  1 g Oral TID WC & HS   venlafaxine XR  75 mg Oral Q breakfast    Outpatient Dialysis Orders: TTS [facility]  4h  400/800   2/2 bath   RIJ TDC   Heparin 4000 + 2000 midrun  EDW was just lowered to 79 kg  - rocaltrol 0.75 mcg po three times per week - mircera 120 mcg  IV q 4 wks - last mircera given 8/10 (not given after discharge)  Just discharged 01/17/23   Assessment/Plan: # Intractable Nausea/vomiting  - per primary team, CT with improving colitis - Possible gastroparesis, previously with considerable hyperglycemia.  Blood sugars are overall improved from that admission but still elevated   # ESRD - HD per TTS schedule  - order is in for daily weights - AVF is not salvageable per prior vascular notes, using TDC   # HTN and volume   - Anasarca  noted on prior admission - optimize volume status with HD  - note that she reports that she takes midodrine prior to outpatient HD and then was instructed to bring a dose to the clinic in case she needs another dose - we have given midodrine PRN here and have used albumin as needed   # Hyponatremia  - was improving, lower for the past few days -UF with HD  as tolerated   # Anemia of CKD  - Hgb 9.6. Will start low dose aranesp   # Metabolic bone disease  - on renvela - given concern for possible GI motility disorder will stop.  She would benefit from a new binder.   - Start auryxia with meals  - continue calcitriol 0.75 mcg three times a week  Rogers Blocker, PA-C 01/27/2023, 8:36 AM  [facility] Pager: [phone]

## 2023-01-28 DIAGNOSIS — R112 Nausea with vomiting, unspecified: Secondary | ICD-10-CM | POA: Diagnosis not present

## 2023-01-28 DIAGNOSIS — F32A Depression, unspecified: Secondary | ICD-10-CM | POA: Diagnosis not present

## 2023-01-28 LAB — BASIC METABOLIC PANEL
Anion gap: 11 (ref 5–15)
BUN: 37 mg/dL — ABNORMAL HIGH (ref 8–23)
CO2: 25 mmol/L (ref 22–32)
Calcium: 8.6 mg/dL — ABNORMAL LOW (ref 8.9–10.3)
Chloride: 94 mmol/L — ABNORMAL LOW (ref 98–111)
Creatinine, Ser: 4.59 mg/dL — ABNORMAL HIGH (ref 0.44–1.00)
GFR, Estimated: 10 mL/min — ABNORMAL LOW (ref >60)
Glucose, Bld: 369 mg/dL — ABNORMAL HIGH (ref 70–99)
Potassium: 4.2 mmol/L (ref 3.5–5.1)
Sodium: 130 mmol/L — ABNORMAL LOW (ref 135–145)

## 2023-01-28 LAB — GLUCOSE, CAPILLARY
Glucose-Capillary: 297 mg/dL — ABNORMAL HIGH (ref 70–99)
Glucose-Capillary: 387 mg/dL — ABNORMAL HIGH (ref 70–99)
Glucose-Capillary: 321 mg/dL — ABNORMAL HIGH (ref 70–99)
Glucose-Capillary: 349 mg/dL — ABNORMAL HIGH (ref 70–99)
Glucose-Capillary: 379 mg/dL — ABNORMAL HIGH (ref 70–99)

## 2023-01-28 MED ORDER — ALBUMIN HUMAN 25 % IV SOLN
25.0000 g | Freq: Once | INTRAVENOUS | Status: AC
  Administered 2023-01-29: 25 g via INTRAVENOUS
  Filled 2023-01-28: qty 100

## 2023-01-28 MED ORDER — MIRTAZAPINE 15 MG PO TABS
7.5000 mg | ORAL_TABLET | Freq: Every day | ORAL | Status: DC
  Administered 2023-01-28 – 2023-02-01 (×5): 7.5 mg via ORAL
  Filled 2023-01-28 (×5): qty 1

## 2023-01-28 MED ORDER — CHLORHEXIDINE GLUCONATE CLOTH 2 % EX PADS
6.0000 | MEDICATED_PAD | Freq: Every day | CUTANEOUS | Status: DC
  Administered 2023-01-28 – 2023-02-02 (×5): 6 via TOPICAL

## 2023-01-28 MED ORDER — INSULIN GLARGINE-YFGN 100 UNIT/ML ~~LOC~~ SOLN
12.0000 [IU] | Freq: Two times a day (BID) | SUBCUTANEOUS | Status: DC
  Administered 2023-01-28 – 2023-01-29 (×2): 12 [IU] via SUBCUTANEOUS
  Filled 2023-01-28 (×3): qty 0.12

## 2023-01-28 MED ORDER — INSULIN ASPART 100 UNIT/ML IJ SOLN
5.0000 [IU] | Freq: Three times a day (TID) | INTRAMUSCULAR | Status: DC
  Administered 2023-01-28 – 2023-01-29 (×4): 5 [IU] via SUBCUTANEOUS

## 2023-01-28 MED ORDER — MIDODRINE HCL 5 MG PO TABS
5.0000 mg | ORAL_TABLET | ORAL | Status: DC
  Administered 2023-01-29 – 2023-01-31 (×3): 5 mg via ORAL
  Filled 2023-01-28 (×4): qty 1

## 2023-01-28 NOTE — Progress Notes (Signed)
PROGRESS NOTE    Susan [name]  WUJ:[id number] DOB: Apr 24, 1951 DOA: 01/20/2023 PCP: Susan Brooks, MD  Chief Complaint  Patient presents with   Nausea   Emesis    Brief Narrative:   72-year-old chronically sick lady with history of ESRD on hemodialysis TTS schedule, peripheral vascular disease status post right BKA, recent left wound debridement and skin grafting by Dr. 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  In the emergency room afebrile.  WBC count normal.  Glucose 309.  Upper quadrant ultrasound with cholelithiasis without evidence of acute cholecystitis.  CT scan consistent with pulmonary vascular congestion, interstitial prominence, small bilateral pleural effusion.  EKG with right bundle branch block, sinus rhythm, QTc 540.  Due to persistent nausea vomiting, she was given Ativan, Zofran and promethazine.  Patient remained in the hospital due to persistent nausea. 9/3: Patient came back from hemodialysis.  Dramatically patient ate 100% of her lunch without using any nausea medications.  Assessment & Plan:   Active Problems:   ESRD (end stage renal disease) (HCC)   Depression   Intractable nausea and vomiting   Nausea  Intractable nausea and vomiting with recent episode of colitis: Persistent symptoms.  CT scan with evidence of improving colitis.   Unclear cause Patient with prolonged QTc, limitation on using antiemetics. Currently on PPI, ativan prn, occasional use of Phenergan. Followed by GI.  Recommended conservative management.  On nystatin swish and swallow. Allowing regular diet so she can have more choices. CT scan, right upper quadrant ultrasound without any acute findings. Appreciate psychiatry assistance   ESRD HD per renal  Chronic  systolic congestive heart failure: volume per renal with HD Paroxysmal Susan [name]-fib, currently in sinus rhythm.  On Coreg and Eliquis. Coronary artery disease, status post stent.  Stable. Hypertension, coreg, has midodrine on TTS with dialysis Anemia of chronic disease, hemoglobin stable Hypothyroidism, on synthroid Depression  Anxiety continue venlafaxine, clonazepam - Remeron added   Type 2 diabetes, hyperglycemia.  Basal, bolus, and SSI - adjust prn    Peripheral vascular disease Status post right BKA.   Status post debridement and fish skin grafting left leg: Local wound care.  Dry dressing. Dopplerable PT pulse on left, needs outpatient vascular follow up Eliquis, lipitor    Prolonged QTc: Limits our ability to use nausea medicines.  Chronic stable prolonged QTc.  Currently using prn phenergan. Will repeat EKG    Repeat EKG 9/2, QTc 521.     DVT prophylaxis: eliquis Code Status: full Family Communication: none Disposition:   Status is: Inpatient Remains inpatient appropriate because: continued need for inpatient care   Consultants:  Psychiatry nephrology  Procedures:  none  Antimicrobials:  Anti-infectives (From admission, onward)    None       Subjective: C/o nausea, poor PO intake  Objective: Vitals:   01/28/23 0531 01/28/23 0713 01/28/23 0733 01/28/23 1611  BP: (!) 100/35  129/63 123/63  Pulse: 81  82 76  Resp: 16  18 18   Temp: (!) 97.5 F (36.4 C)  98 F (36.7 C) 97.9 F (36.6 C)  TempSrc: Oral  Oral   SpO2: 100%  100% 97%  Weight:  78.2 kg      Intake/Output Summary (Last 24 hours) at 01/28/2023 1635 Last data filed at 01/28/2023 1300 Gross per 24 hour  Intake 120  ml  Output 0 ml  Net 120 ml   Filed Weights   01/26/23 0500 01/27/23 0500 01/28/23 0713  Weight: 76.3 kg 78 kg 78.2 kg    Examination:  General exam: Appears calm and comfortable  Respiratory system: unlabored Cardiovascular system: RRR Gastrointestinal system: Abdomen is  nondistended, soft and nontender Central nervous system: Alert and oriented. No focal neurological deficits. Extremities: no [name]  Data Reviewed: I have personally reviewed following labs and imaging studies  CBC: Recent Labs  Lab 01/24/23 0850 01/25/23 0506 01/27/23 0730  WBC 7.6 8.3 6.7  HGB 10.6* 10.4* 9.6*  HCT 35.8* 35.1* 32.4*  MCV 77.7* 75.8* 78.1*  PLT 271 206 198    Basic Metabolic Panel: Recent Labs  Lab 01/22/23 0850 01/22/23 2126 01/23/23 1150 01/24/23 0559 01/25/23 0506 01/26/23 0430 01/27/23 0554 01/28/23 0611  NA 133*  --  131* 129* 133* 129* 130* 130*  K 4.1  --  3.6 4.2 4.5 4.0 4.4 4.2  CL 98  --  96* 91* 97* 94* 95* 94*  CO2 22  --  23 21* 22 25 23 25   GLUCOSE 248*  --  244* 360* 379* 159* 271* 369*  BUN 45*  --  30* 36* 20 29* 51* 37*  CREATININE 4.42*  --  3.71* 4.57* 3.91* 4.51* 5.85* 4.59*  CALCIUM 8.6*  --  8.4* 8.9 9.1 9.1 8.9 8.6*  MG  --  1.6*  --  1.9  --   --   --   --   PHOS 4.6  --  4.6  --   --   --   --   --     GFR: Estimated Creatinine Clearance: 11.3 mL/min (Susan [name]) (by C-G formula based on SCr of 4.59 mg/dL (H)).  Liver Function Tests: Recent Labs  Lab 01/22/23 0850 01/23/23 1150  ALBUMIN 2.5* 2.6*    CBG: Recent Labs  Lab 01/27/23 2037 01/27/23 2231 01/28/23 0724 01/28/23 1110 01/28/23 1613  GLUCAP 285* 297* 387* 321* 349*     Recent Results (from the past 240 hour(s))  MRSA Next Gen by PCR, Nasal     Status: None   Collection Time: 01/20/23  6:56 PM   Specimen: Nasal Mucosa; Nasal Swab  Result Value Ref Range Status   MRSA by PCR Next Gen NOT DETECTED NOT DETECTED Final    Comment: (NOTE) The GeneXpert MRSA Assay (FDA approved for NASAL specimens only), is one component of Susan [name] comprehensive MRSA colonization surveillance program. It is not intended to diagnose MRSA infection nor to guide or monitor treatment for MRSA infections. Test performance is not FDA approved in patients less than 39 years old. Performed  at [facility], [address], [place], [place] [address]          Radiology Studies: No results found.      Scheduled Meds:  apixaban  5 mg Oral BID   atorvastatin  80 mg Oral Daily   calcitRIOL  0.75 mcg Oral Daily   carvedilol  6.25 mg Oral BID WC   Chlorhexidine Gluconate Cloth  6 each Topical Q0600   Chlorhexidine Gluconate Cloth  6 each Topical Q0600   clonazePAM  0.5 mg Oral BID   darbepoetin (ARANESP) injection - DIALYSIS  25 mcg Subcutaneous Q Tue-1800   ferric citrate  420 mg Oral TID WC   folic acid  1 mg Oral Daily   insulin aspart  0-5 Units Subcutaneous QHS   insulin aspart  0-9 Units  Subcutaneous TID WC   insulin aspart  3 Units Subcutaneous TID WC   insulin glargine-yfgn  8 Units Subcutaneous BID   levothyroxine  75 mcg Oral QAC breakfast   melatonin  3 mg Oral QHS   [START ON 01/29/2023] midodrine  5 mg Oral Q T,Th,Sa-HD   mirtazapine  7.5 mg Oral QHS   nystatin  5 mL Oral QID   pantoprazole  40 mg Oral Daily   sucralfate  1 g Oral TID WC & HS   venlafaxine XR  75 mg Oral Q breakfast   Continuous Infusions:  [START ON 01/29/2023] albumin human     promethazine (PHENERGAN) injection (IM or IVPB) 12.5 mg (01/28/23 0837)     LOS: 8 days    Time spent: over 30 min    Lacretia Nicks, MD [facility]   To contact the attending provider between 7A-7P or the covering provider during after hours 7P-7A, please log into the web site [email or web address] and access using universal [place] password for that web site. If you do not have the password, please call the hospital operator.  01/28/2023, 4:35 PM

## 2023-01-28 NOTE — Consult Note (Signed)
[name] Psychiatry Consult Note   Service Date: January 28, 2023 LOS:  LOS: 8 days  MRN: [id number] Type of consult:  Followup   Primary Psychiatric Diagnoses  MDD, recurrent, moderate Historical diagnosis of generalized anxiety disorder with panic attacks  Assessment  [name] is a 72 y.o. female with a past psychiatric history of MDD and GAD with panic attacks and significant PMHx of  ESRD on HD, hypothyroidism, morbid obesity, CAD, and a-fib . Psychiatry was consulted for "persistent nausea, no organic cause. psychogenic symptoms?" by Susan Carrow, MD.   Patient feels nauseous again and received anti-nausea/emetic medication. She meets criteria for MDD with prominent symptomatology of insomnia and loss of appetite. Patient amenable to adjustment of psychotropic medications, specifically, addition of mirtazapine to antidepressant regimen. Risks, benefits, and common side-effects explained.  Please see plan below for detailed recommendations.   Diagnoses:  Active Hospital problems: Active Problems:   Depression   ESRD (end stage renal disease) (HCC)   Intractable nausea and vomiting   Nausea  Plan and Recommendations  ## Interventions (medications, psychoeducation, etc):  -- start mirtazapine 7.5 mg daily at bedtime for antidepressant augmentation with favorable side-effects of sedation and appetite stimulation -- continue melatonin 3 mg at bedtime for insomnia -- continue venlafaxine XR 24-hr capsule 75 mg daily for depressive symptoms -- continue clonazepam dissolvable tablet 0.5 mg twice daily for anxiety -- discontinue primary team prescribed amitriptyline for treatment of insomnia -- recommend outpatient psychiatry follow-up  ## Further Work-up:  -- none  -- most recent EKG on 521 had QtC of 01/26/2023 -- Pertinent labwork reviewed earlier this admission includes: CBC, CMP  ## Medical Decision Making Capacity:  - Not formally assessed during this  encounter  ## Disposition:  - No indication for inpatient psychiatric admission. Recommending outpatient psychiatry / psychology follow-up. Defer immediate disposition plan to primary team.  ## Behavioral / Environmental:  -- Standard delirium precautions and Fall precautions  ##Legal Status -- Patient voluntary  Thank you for this consult request. Our recommendations are listed above. We will continue to follow patient's hospital course  ## Safety and Observation Level:  - Based on my clinical evaluation, I estimate the patient to be at low risk of self harm in the current setting - At this time, we recommend a no additional level of observation. This decision is based on my review of the chart including patient's history and current presentation, interview of the patient, mental status examination, and consideration of suicide risk including evaluating suicidal ideation, plan, intent, suicidal or self-harm behaviors, risk factors, and protective factors. This judgment is based on our ability to directly address suicide risk, implement suicide prevention strategies and develop a safety plan while the patient is in the clinical setting. Please contact our team if there is a concern that risk level has changed.  Susan Gamble, MD  History Obtained on Initial Interview  Relevant Aspects of Hospital Course:  Admitted on 01/20/2023 for recurrent n/v and inability to take PO.  Patient Report:  Patient says she felt nauseous again last night but does not know what's causing it. She says she's anxious sometimes, but reports she's more depressed than anxious. She endorses "months" of feeling persistently low mood, insomnia, poor appetite, lack of energy, poor concentration, and having no motivation to get out of bed. She denies any suicidal thoughts.  She reports going to sleep at 2:30am and waking up at 4:30am this morning.  She says she does not know if  she has tried trazodone before but is quite  certain she has not tried mirtazapine. I discussed the risks, benefits, and common side effects of mirtazapine with patient - she was amenable to trying it to treat her depression, insomnia, and lack of appetite.  Psychiatric ROS:  Depression: meets criteria for MDD (see above patient report) Mania: denies history of persistently elevated mood Anxiety: "sometimes" Psychosis: denies AVH, no delusions noted  Psychiatric and Social History   Psychiatric History  Information collected from patient, collateral contact/s, and available chart review  Prev Dx/Sx: Depression, anxiety Current Psych Provider: PCP Home Meds (current): trintillex 20 mg, lorazepam 0.5 mg QHS Previous Med Trials: lexapro, zoloft, prozac (briefly effective), wellbutrin (unclear efficacy).  Therapy: no     Prior Psych Hospitalization: no  Prior Self Harm: no Prior Violence: no   Family Psych History: Son attempted suicide via GSW to head, now disabled.      Social History:   Educational Hx: high school Occupational Hx: office Legal Hx: no Living Situation: with husband of 24 years, son 3 days/week Spiritual Hx: yes, but not religious Access to weapons: yes - has guns in bedroom   Substance History Tobacco use: quit 20+ y ears ago Alcohol use: no Drug use: no   Other Histories  These are pulled from EMR and updated if appropriate.   Family History:  The patient's family history includes Heart attack (age of onset: 22) in her mother.  Medical History: Past Medical History:  Diagnosis Date   Anemia    hx   Anxiety    Arthritis    "generalized" (03/15/2014)   CAD (coronary artery disease)    MI in 2000 - MI  2007 - treated bare metal stent (no nuclear since then as 9/11)   Carotid artery disease (HCC)    Chronic diastolic heart failure (HCC)    a) ECHO (08/2013) EF 55-60% and RV function nl b) RHC (08/2013) RA 4, RV 30/5/7, PA 25/10 (16), PCWP 7, Fick CO/CI 6.3/2.7, PVR 1.5 WU, PA 61 and 66%    Daily headache    "~ every other day; since I fell in June" (03/15/2014)   Depression    Diabetic retinopathy (HCC)    Dyslipidemia    ESRD (end stage renal disease) (HCC)    Dialysis on Tues Thurs Sat   Exertional shortness of breath    History of kidney stones    HTN (hypertension)    Hypothyroidism    Obesity    Osteoarthritis    PAF (paroxysmal atrial fibrillation) (HCC)    Peripheral neuropathy    bilateral feet/hands   PONV (postoperative nausea and vomiting)    RBBB (right bundle branch block)    Old   Stroke (HCC)    mini strokes   Type II diabetes mellitus (HCC)    Type II, [name]. patient has omnipod insulin pump with Novolin R Insulin    Surgical History: Past Surgical History:  Procedure Laterality Date   A/V FISTULAGRAM Left 11/07/2022   Procedure: A/V Fistulagram;  Surgeon: Leonie Douglas, MD;  [facility]: [facility];  Service: Cardiovascular;  Laterality: Left;   ABDOMINAL HYSTERECTOMY  1980's   AMPUTATION Right 02/24/2018   Procedure: RIGHT FOOT GREAT TOE AND 2ND TOE AMPUTATION;  Surgeon: Nadara Mustard, MD;  Location: [facility] OR;  Service: Orthopedics;  Laterality: Right;   AMPUTATION Right 04/30/2018   Procedure: RIGHT TRANSMETATARSAL AMPUTATION;  Surgeon: Nadara Mustard, MD;  Location:  [facility] OR;  Service: Orthopedics;  Laterality: Right;   AMPUTATION Right 05/02/2022   Procedure: RIGHT BELOW KNEE AMPUTATION;  Surgeon: Nadara Mustard, MD;  Location: [facility] OR;  Service: Orthopedics;  Laterality: Right;   APPLICATION OF WOUND VAC Right 06/13/2022   Procedure: APPLICATION OF WOUND VAC;  Surgeon: Nadara Mustard, MD;  Location: [facility] OR;  Service: Orthopedics;  Laterality: Right;   APPLICATION OF WOUND VAC Left 11/14/2022   Procedure: APPLICATION OF WOUND VAC;  Surgeon: Nadara Mustard, MD;  [facility]: [facility] OR;  Service: Orthopedics;  Laterality: Left;   AV FISTULA PLACEMENT Left 04/02/2022   Procedure: LEFT ARM ARTERIOVENOUS (AV) FISTULA CREATION;   Surgeon: Nada Libman, MD;  Location: [facility] OR;  Service: Vascular;  Laterality: Left;  PERIPHERAL NERVE BLOCK   BASCILIC VEIN TRANSPOSITION Left 07/31/2022   Procedure: LEFT ARM SECOND STAGE BASILIC VEIN TRANSPOSITION;  Surgeon: Nada Libman, MD;  Location: [facility] OR;  Service: Vascular;  Laterality: Left;   BIOPSY  05/27/2020   Procedure: BIOPSY;  Surgeon: Lanelle Bal, DO;  Location: [facility];  Service: Endoscopy;;   CATARACT EXTRACTION, BILATERAL Bilateral ?2013   COLONOSCOPY W/ POLYPECTOMY     COLONOSCOPY WITH PROPOFOL N/A 03/13/2019   Procedure: COLONOSCOPY WITH PROPOFOL;  Surgeon: Beverley Fiedler, MD;  Location: [facility] ENDOSCOPY;  Service: Gastroenterology;  Laterality: N/A;   CORONARY ANGIOPLASTY WITH STENT PLACEMENT  1999; 2007   "1 + 1"   ERCP N/A 02/03/2022   Procedure: ENDOSCOPIC RETROGRADE CHOLANGIOPANCREATOGRAPHY (ERCP);  Surgeon: Jeani Hawking, MD;  Location: [facility] ENDOSCOPY;  Service: Gastroenterology;  Laterality: N/A;   ESOPHAGOGASTRODUODENOSCOPY (EGD) WITH PROPOFOL N/A 03/13/2019   Procedure: ESOPHAGOGASTRODUODENOSCOPY (EGD) WITH PROPOFOL;  Surgeon: Beverley Fiedler, MD;  Location: [facility] ENDOSCOPY;  Service: Gastroenterology;  Laterality: N/A;   ESOPHAGOGASTRODUODENOSCOPY (EGD) WITH PROPOFOL N/A 05/27/2020   Procedure: ESOPHAGOGASTRODUODENOSCOPY (EGD) WITH PROPOFOL;  Surgeon: Lanelle Bal, DO;  [facility];  Service: Endoscopy;  Laterality: N/A;   ESOPHAGOGASTRODUODENOSCOPY (EGD) WITH PROPOFOL N/A 09/03/2022   Procedure: ESOPHAGOGASTRODUODENOSCOPY (EGD) WITH PROPOFOL;  Surgeon: Jeani Hawking, MD;  Location: [facility] ENDOSCOPY;  Service: Gastroenterology;  Laterality: N/A;   EYE SURGERY Bilateral    lazer   FLEXIBLE SIGMOIDOSCOPY N/A 05/23/2022   Procedure: FLEXIBLE SIGMOIDOSCOPY;  Surgeon: Jeani Hawking, MD;  Location: [facility] ENDOSCOPY;  Service: Gastroenterology;  Laterality: N/A;   FLEXIBLE SIGMOIDOSCOPY N/A 05/24/2022   Procedure: FLEXIBLE SIGMOIDOSCOPY;  Surgeon: Imogene Burn, MD;  Location: [facility] ENDOSCOPY;  Service: Gastroenterology;  Laterality: N/A;   FLEXIBLE SIGMOIDOSCOPY N/A 09/03/2022   Procedure: FLEXIBLE SIGMOIDOSCOPY;  Surgeon: Jeani Hawking, MD;  Location: [facility] ENDOSCOPY;  Service: Gastroenterology;  Laterality: N/A;   HEMOSTASIS CLIP PLACEMENT  03/13/2019   Procedure: HEMOSTASIS CLIP PLACEMENT;  Surgeon: Beverley Fiedler, MD;  Location: [facility] ENDOSCOPY;  Service: Gastroenterology;;   HEMOSTASIS CLIP PLACEMENT  05/23/2022   Procedure: HEMOSTASIS CLIP PLACEMENT;  Surgeon: Jeani Hawking, MD;  Location: [facility] ENDOSCOPY;  Service: Gastroenterology;;   HEMOSTASIS CONTROL  05/24/2022   Procedure: HEMOSTASIS CONTROL;  Surgeon: Imogene Burn, MD;  Location: [facility] ENDOSCOPY;  Service: Gastroenterology;;   HOT HEMOSTASIS N/A 05/23/2022   Procedure: HOT HEMOSTASIS (ARGON PLASMA COAGULATION/BICAP);  Surgeon: Jeani Hawking, MD;  Location: [facility] ENDOSCOPY;  Service: Gastroenterology;  Laterality: N/A;   I & D EXTREMITY Left 05/05/2022   Procedure: IRRIGATION AND DEBRIDEMENT LEFT ARM AV FISTULA;  Surgeon: Cephus Shelling, MD;  Location: [facility] OR;  Service: Vascular;  Laterality: Left;   I &  D EXTREMITY N/A 11/14/2022   Procedure: IRRIGATION AND DEBRIDEMENT OF LOWER EXTREMITY WOUND;  Surgeon: Nadara Mustard, MD;  Location: [facility] OR;  Service: Orthopedics;  Laterality: N/A;   INSERTION OF DIALYSIS CATHETER Right 04/02/2022   Procedure: INSERTION OF TUNNELED DIALYSIS CATHETER;  Surgeon: Nada Libman, MD;  Location: [facility] OR;  Service: Vascular;  Laterality: Right;   KNEE ARTHROSCOPY Left 10/25/2006   POLYPECTOMY  03/13/2019   Procedure: POLYPECTOMY;  Surgeon: Beverley Fiedler, MD;  Location[facility] ENDOSCOPY;  Service: Gastroenterology;;   REMOVAL OF STONES  02/03/2022   Procedure: REMOVAL OF STONES;  Surgeon: Jeani Hawking, MD;  Location: [facility] ENDOSCOPY;  Service: Gastroenterology;;   REVISON OF ARTERIOVENOUS FISTULA Left 08/20/2022   Procedure: REVISON OF LEFT ARM ARTERIOVENOUS FISTULA;   Surgeon: Nada Libman, MD;  [facility] OR;  Service: Vascular;  Laterality: Left;   RIGHT HEART CATH N/A 07/24/2017   Procedure: RIGHT HEART CATH;  Surgeon: Dolores Patty, MD;  [facility];  Service: Cardiovascular;  Laterality: N/A;   RIGHT HEART CATHETERIZATION N/A 09/22/2013   Procedure: RIGHT HEART CATH;  Surgeon: Dolores Patty, MD;  Location: [facility] CATH LAB;  Service: Cardiovascular;  Laterality: N/A;   SHOULDER ARTHROSCOPY WITH OPEN ROTATOR CUFF REPAIR Right 03/14/2014   Procedure: RIGHT SHOULDER ARTHROSCOPY WITH BICEPS RELEASE, OPEN SUBSCAPULA REPAIR, OPEN SUPRASPINATUS REPAIR.;  Surgeon: Cammy Copa, MD;  Location: [facility] OR;  Service: Orthopedics;  Laterality: Right;   SPHINCTEROTOMY  02/03/2022   Procedure: SPHINCTEROTOMY;  Surgeon: Jeani Hawking, MD;  Location: [facility] ENDOSCOPY;  Service: Gastroenterology;;   STUMP REVISION Right 06/13/2022   Procedure: REVISION RIGHT BELOW KNEE AMPUTATION;  Surgeon: Nadara Mustard, MD;  Location: [facility] OR;  Service: Orthopedics;  Laterality: Right;   TEE WITHOUT CARDIOVERSION N/A 02/04/2022   Procedure: TRANSESOPHAGEAL ECHOCARDIOGRAM (TEE);  Surgeon: Dolores Patty, MD;  Location: [facility] ENDOSCOPY;  Service: Cardiovascular;  Laterality: N/A;   THROMBECTOMY W/ EMBOLECTOMY Left 08/20/2022   Procedure: THROMBECTOMY OF LEFT ARM ARTERIOVENOUS FISTULA;  Surgeon: Nada Libman, MD;  Location: [facility] OR;  Service: Vascular;  Laterality: Left;   TOE AMPUTATION Right 02/24/2018   GREAT TOE AND 2ND TOE AMPUTATION   TUBAL LIGATION  1970's    Medications:   Current Facility-Administered Medications:    acetaminophen (TYLENOL) tablet 650 mg, 650 mg, Oral, Q6H PRN **OR** acetaminophen (TYLENOL) suppository 650 mg, 650 mg, Rectal, Q6H PRN, Lurline Del, MD   [START ON 01/29/2023] albumin human 25 % solution 25 g, 25 g, Intravenous, Once in dialysis, Collins, Samantha G, PA-C   albuterol (PROVENTIL) (2.5 MG/3ML) 0.083% nebulizer solution  2.5 mg, 2.5 mg, Nebulization, Q2H PRN, Lurline Del, MD   apixaban (ELIQUIS) tablet 5 mg, 5 mg, Oral, BID, Vallery Sa C, MD, 5 mg at 01/28/23 0981   atorvastatin (LIPITOR) tablet 80 mg, 80 mg, Oral, Daily, Skip Mayer A, MD, 80 mg at 01/28/23 1914   calcitRIOL (ROCALTROL) capsule 0.75 mcg, 0.75 mcg, Oral, Daily, Vallery Sa C, MD, 0.75 mcg at 01/28/23 0837   carvedilol (COREG) tablet 6.25 mg, 6.25 mg, Oral, BID WC, Skip Mayer A, MD, 6.25 mg at 01/28/23 0735   Chlorhexidine Gluconate Cloth 2 % PADS 6 each, 6 each, Topical, Q0600, Estanislado Emms, MD, 6 each at 01/28/23 0524   Chlorhexidine Gluconate Cloth 2 % PADS 6 each, 6 each, Topical, Q0600, Collins, Samantha G, PA-C   clonazePAM (KLONOPIN) disintegrating tablet 0.5 mg, 0.5 mg, Oral, BID, Skip Mayer  A, MD, 0.5 mg at 01/28/23 0272   Darbepoetin Alfa (ARANESP) injection 25 mcg, 25 mcg, Subcutaneous, Q Tue-1800, Collins, Samantha G, PA-C, 25 mcg at 01/27/23 1701   ferric citrate (AURYXIA) tablet 420 mg, 420 mg, Oral, TID WC, Estanislado Emms, MD, 420 mg at 01/28/23 5366   folic acid (FOLVITE) tablet 1 mg, 1 mg, Oral, Daily, Skip Mayer A, MD, 1 mg at 01/28/23 4403   insulin aspart (novoLOG) injection 0-5 Units, 0-5 Units, Subcutaneous, QHS, Julian Reil, Jared M, DO, 3 Units at 01/27/23 2236   insulin aspart (novoLOG) injection 0-9 Units, 0-9 Units, Subcutaneous, TID WC, Hillary Bow, DO, 9 Units at 01/28/23 0735   insulin aspart (novoLOG) injection 3 Units, 3 Units, Subcutaneous, TID WC, Susan Carrow, MD, 3 Units at 01/27/23 1703   insulin glargine-yfgn (SEMGLEE) injection 8 Units, 8 Units, Subcutaneous, BID, Susan Carrow, MD, 8 Units at 01/28/23 0841   levothyroxine (SYNTHROID) tablet 75 mcg, 75 mcg, Oral, QAC breakfast, Lurline Del, MD, 75 mcg at 01/28/23 0523   LORazepam (ATIVAN) injection 0.5 mg, 0.5 mg, Intravenous, Q6H PRN, Julian Reil, Jared M, DO, 0.5 mg at 01/25/23 1600   melatonin tablet 3 mg, 3 mg,  Oral, QHS, Cinderella, Margaret A, 3 mg at 01/27/23 2219   [START ON 01/29/2023] midodrine (PROAMATINE) tablet 5 mg, 5 mg, Oral, Q T,Th,Sa-HD, Collins, Samantha G, PA-C   mirtazapine (REMERON) tablet 7.5 mg, 7.5 mg, Oral, QHS, Susan Gamble, MD   nystatin (MYCOSTATIN) 100000 UNIT/ML suspension 500,000 Units, 5 mL, Oral, QID, Jeani Hawking, MD, 500,000 Units at 01/28/23 0838   pantoprazole (PROTONIX) EC tablet 40 mg, 40 mg, Oral, Daily, Paytes, Austin A, RPH, 40 mg at 01/28/23 0838   promethazine (PHENERGAN) 12.5 mg in sodium chloride 0.9 % 50 mL IVPB, 12.5 mg, Intravenous, Q6H PRN, Susan Carrow, MD, Last Rate: 200 mL/hr at 01/28/23 0837, 12.5 mg at 01/28/23 0837   sucralfate (CARAFATE) 1 GM/10ML suspension 1 g, 1 g, Oral, TID WC & HS, Skip Mayer A, MD, 1 g at 01/28/23 0736   venlafaxine XR (EFFEXOR-XR) 24 hr capsule 75 mg, 75 mg, Oral, Q breakfast, Susan Carrow, MD, 75 mg at 01/28/23 0735  Allergies: Allergies  Allergen Reactions   Cephalexin Diarrhea and Other (See Comments)   Codeine Nausea And Vomiting and Other (See Comments)    Exam Findings  Vital signs:  Temp:  [97.5 F (36.4 C)-98.1 F (36.7 C)] 98 F (36.7 C) (09/04 0733) Pulse Rate:  [74-82] 82 (09/04 0733) Resp:  [16-18] 18 (09/04 0733) BP: (95-129)/(35-63) 129/63 (09/04 0733) SpO2:  [100 %] 100 % (09/04 0733) Weight:  [78.2 kg] 78.2 kg (09/04 0713)  Psychiatric Specialty Exam:  Presentation  General Appearance: Casual  Eye Contact:Good  Speech:Clear and Coherent; Normal Rate  Speech Volume:Normal  Handedness:Right   Mood and Affect  Mood:-- ("alright")  Affect:Non-Congruent; Appropriate; Depressed; Restricted   Thought Process  Thought Processes:Linear; Goal Directed; Coherent  Descriptions of Associations:Intact  Orientation:Full (Time, Place and Person)  Thought Content:WDL  History of Schizophrenia/Schizoaffective disorder:No data recorded Duration of Psychotic Symptoms:No data  recorded Hallucinations:Hallucinations: None  Ideas of Reference:None  Suicidal Thoughts:Suicidal Thoughts: No  Homicidal Thoughts:Homicidal Thoughts: No   Sensorium  Memory:Immediate Good; Remote Good  Judgment:Good  Insight:Fair   Executive Functions  Concentration:Good  Attention Span:Good  Recall:Fair  Fund of Knowledge:Good  Language:Good   Psychomotor Activity  Psychomotor Activity:Psychomotor Activity: Psychomotor Retardation   Assets  Assets:Resilience   Sleep  Sleep:Sleep: Poor   Physical Exam:  Physical Exam Vitals and nursing note reviewed.  HENT:     Head: Normocephalic and atraumatic.  Pulmonary:     Effort: Pulmonary effort is normal.    Blood pressure 129/63, pulse 82, temperature 98 F (36.7 C), temperature source Oral, resp. rate 18, weight 78.2 kg, SpO2 100%. Body mass index is 28.69 kg/m.

## 2023-01-28 NOTE — Inpatient Diabetes Management (Signed)
Inpatient Diabetes Program Recommendations  AACE/ADA: New Consensus Statement on Inpatient Glycemic Control (2015)  Target Ranges:  Prepandial:   less than 140 mg/dL      Peak postprandial:   less than 180 mg/dL (1-2 hours)      Critically ill patients:  140 - 180 mg/dL   Lab Results  Component Value Date   GLUCAP 387 (H) 01/28/2023   HGBA1C 7.9 (H) 08/27/2022    Review of Glycemic Control  Latest Reference Range & Units 01/27/23 07:20 01/27/23 13:22 01/27/23 16:33 01/27/23 20:37 01/27/23 22:31 01/28/23 07:24  Glucose-Capillary 70 - 99 mg/dL 161 (H) 096 (H) 045 (H) 285 (H) 297 (H) 387 (H)    Diabetes history: DM 2 Outpatient Diabetes medications: Novolog sliding scale: <100 - 0 units, 100-150 - give 5 units, 151-200 -  give 10 units, 201-150 -  give 15 units, >250 -give 18 units, <300  Lantus 13 units, >300 give Lantus 26 units Current orders for Inpatient glycemic control:  Semglee 8 units bid Novolog 0-9 units tid Novolog 3 units tid meal coverage elevated renal function/ESRD dialysis TTS  Endocrinologist: Dr. Talmage Nap last visit less than 1 month ago Sees Endocrinology every 3 months  Note: Postprandial glucose trends increasing still  Inpatient Diabetes Program Recommendations:    -   Increase Semglee to 10 units bid -   Increase Novolog meal coverage to 4 units tid meal coverage if eating >50% of meals (when eating)   Thanks,  Christena Deem RN, MSN, BC-ADM Inpatient Diabetes Coordinator Team Pager [phone] (8a-5p)

## 2023-01-28 NOTE — TOC Progression Note (Addendum)
Transition of Care [facility]) - Progression Note    Patient Details  Name: Susan [name] MRN: [id number] Date of Birth: 1951-04-21  Transition of Care [facility] CM/SW Contact  Tom-Johnson, Hershal Coria, RN Phone Number: 01/28/2023, 11:42 AM  Clinical Narrative:     CM spoke with patient about discharge disposition. Patient states she does not want to go to rehab but wants to return home with Home Health disciplines. Patient active with [facility] and would like to continue with them.  CM sent resumption of care referral to [facility] with acceptance noted, info on AVS.   CM will continue to follow as patient progresses with care towards discharge.       Expected Discharge Plan and Services                                               Social Determinants of Health (SDOH) Interventions SDOH Screenings   Food Insecurity: No Food Insecurity (01/15/2023)  Housing: Low Risk  (01/15/2023)  Transportation Needs: No Transportation Needs (01/15/2023)  Utilities: Not At Risk (01/15/2023)  Alcohol Screen: Low Risk  (08/16/2021)  Depression (PHQ2-9): Low Risk  (09/29/2022)  Financial Resource Strain: Low Risk  (08/16/2021)  Physical Activity: Inactive (08/16/2021)  Social Connections: Moderately Isolated (08/16/2021)  Stress: No Stress Concern Present (08/16/2021)  Tobacco Use: Medium Risk (01/20/2023)    Readmission Risk Interventions    01/21/2023    3:09 PM 09/04/2022    5:11 PM 05/12/2022    5:04 PM  Readmission Risk Prevention Plan  Transportation Screening Complete Complete Complete  Medication Review (RN Care Manager) Referral to Pharmacy Complete   PCP or Specialist appointment within 3-5 days of discharge Complete Complete   HRI or Home Care Consult Complete Complete   SW Recovery Care/Counseling Consult Complete Complete Complete  Palliative Care Screening Not Applicable Not Applicable   Skilled Nursing Facility Not Applicable Complete Complete

## 2023-01-28 NOTE — Evaluation (Signed)
Physical Therapy Evaluation Patient Details Name: Susan [name] MRN: [id number] DOB: 1951-05-23 Today's Date: 01/28/2023  History of Present Illness  Susan [name] is a 72 y.o. female who now returns to ED with recurrent n/v and inability to take anything po. Possible gastroparesis or remaining symptoms from previous episode. May have a component of anxiety/depression. PHMx: sCHF EF 30-35%, ESRD on HD TThS, PVD s/p BKA, wheelchair limited mostly, hypothyroidism, morbid obesity, CAD DM and Afib on Eliquis who has interim history of admission  8/22-8/24  at which time she presented with  N/V/D and was found to have proctocolitis and well as early DKA. Patient was started on insulin drip and iv antibiotics. Patient stabilized and was discharged on cephalexin to complete course.  Clinical Impression  Pt presents to PT with deficits in strength, power, endurance, and functional mobility. Pt requires significant assistance to perform lateral scoot transfers with sliding board, although pt reports requiring physical assistance to transfer at baseline. Pt will benefit from continued functional mobility training in an effort to improve activity tolerance and to reduce caregiver burden. PT recommends discharge home with HHPT.        If plan is discharge home, recommend the following: A lot of help with bathing/dressing/bathroom;A lot of help with walking and/or transfers;Assistance with cooking/housework;Assist for transportation;Help with stairs or ramp for entrance   Can travel by private vehicle        Equipment Recommendations None recommended by PT  Recommendations for Other Services       Functional Status Assessment Patient has had a recent decline in their functional status and demonstrates the ability to make significant improvements in function in a reasonable and predictable amount of time.     Precautions / Restrictions Precautions Precautions: Fall Precaution Comments: R BKA, LLE  wound Restrictions Weight Bearing Restrictions: Yes LLE Weight Bearing: Non weight bearing Other Position/Activity Restrictions: per pt she reports Dr. Lajoyce Corners does not want her to put any weight on her LLE due to skin graft      Mobility  Bed Mobility Overal bed mobility: Needs Assistance Bed Mobility: Sit to Supine       Sit to supine: Min assist        Transfers Overall transfer level: Needs assistance Equipment used: Sliding board Transfers: Bed to chair/wheelchair/BSC            Lateral/Scoot Transfers: Mod assist      Ambulation/Gait                  Stairs            Wheelchair Mobility     Tilt Bed    Modified Rankin (Stroke Patients Only)       Balance Overall balance assessment: Needs assistance Sitting-balance support: No upper extremity supported, Feet supported Sitting balance-Leahy Scale: Fair Sitting balance - Comments: CGA                                     Pertinent Vitals/Pain Pain Assessment Pain Assessment: No/denies pain    Home Living Family/patient expects to be discharged to:: Private residence Living Arrangements: Spouse/significant other Available Help at Discharge: Family;Available 24 hours/day Type of Home: Mobile home Home Access: Ramped entrance       Home Layout: One level Home Equipment: Grab bars - tub/shower;Rolling Walker (2 wheels);Rollator (4 wheels);BSC/3in1;Cane - quad;Shower seat;Wheelchair - manual (hoyer lift) Additional Comments: Husband is  out on FMLA until Sept (pt reports maybe longer).She is scheduled to get RLE prosthesis 02/04/2023. 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  Using brief to have BM and husband A with  cleaning     Extremity/Trunk Assessment   Upper Extremity Assessment Upper Extremity Assessment: Overall WFL for tasks assessed    Lower Extremity Assessment Lower Extremity Assessment: Generalized weakness;RLE deficits/detail;LLE deficits/detail RLE Deficits / Details: knee ROM WFL, strength not formally assessed but at least 3/5 based on observed mobility LLE Deficits / Details: knee and ankle ROM WFL, strength not formally assessed but at least 3/5 based on observed mobility. PT notes weeping from dorsal L foot    Cervical / Trunk Assessment Cervical / Trunk Assessment: Kyphotic  Communication   Communication Communication: No apparent difficulties Cueing Techniques: Verbal cues  Cognition Arousal: Alert Behavior During Therapy: WFL for tasks assessed/performed Overall Cognitive Status: Within Functional Limits for tasks assessed                                          General Comments General comments (skin integrity, edema, etc.): VSS on RA, dressing intact on LLE, PT notes weeping on L foot. R shrinker donned    Exercises     Assessment/Plan    PT Assessment Patient needs continued PT services  PT Problem List Decreased strength;Decreased activity tolerance;Decreased balance;Decreased mobility       PT Treatment Interventions DME instruction;Functional mobility training;Therapeutic activities;Therapeutic exercise;Balance training;Neuromuscular re-education;Patient/family education;Wheelchair mobility training    PT Goals (Current goals can be found in the Care Plan section)  Acute Rehab PT Goals Patient Stated Goal: to go home PT Goal Formulation: With patient Time For Goal Achievement: 02/11/23 Potential to Achieve Goals: Good Additional Goals Additional Goal #1: Pt will mobilize in manual wheelchair for 50' at a supervision level to demonstrate improved activity tolerance and ability to mobilize within the home    Frequency Min 1X/week      Co-evaluation               AM-PAC PT "6 Clicks" Mobility  Outcome Measure Help needed turning from your back to your side while in a flat bed without using bedrails?: A Little Help needed moving from lying on your back to sitting on the side of a flat bed without using bedrails?: A Lot Help needed moving to and from a bed to a chair (including a wheelchair)?: A Lot Help needed standing up from a chair using your arms (e.g., wheelchair or bedside chair)?: A Lot Help needed to walk in hospital room?: Total Help needed climbing 3-5 steps with a railing? : Total 6 Click Score: 11    End of Session Equipment Utilized During Treatment: Gait belt Activity Tolerance: Patient tolerated treatment well Patient left: in bed;with call bell/phone within reach;with bed alarm set Nurse Communication: Mobility status;Need for lift equipment PT Visit Diagnosis: Other abnormalities of gait and mobility (R26.89);Muscle weakness (generalized) (M62.81)    Time: 1610-9604 PT Time Calculation (min) (ACUTE ONLY): 16 min   Charges:   PT Evaluation $PT Eval Low Complexity: 1 Low  PT General Charges $$ ACUTE PT VISIT: 1 Visit         Arlyss Gandy, PT, DPT Acute Rehabilitation Office [phone]   Arlyss Gandy 01/28/2023, 2:23 PM

## 2023-01-28 NOTE — Plan of Care (Signed)
  Problem: Cardiac: Goal: Ability to maintain an adequate cardiac output will improve Outcome: Progressing   Problem: Health Behavior/Discharge Planning: Goal: Ability to identify and utilize available resources and services will improve Outcome: Progressing Goal: Ability to manage health-related needs will improve Outcome: Progressing   Problem: Fluid Volume: Goal: Ability to achieve a balanced intake and output will improve Outcome: Progressing   Problem: Metabolic: Goal: Ability to maintain appropriate glucose levels will improve Outcome: Progressing   Problem: Nutritional: Goal: Maintenance of adequate nutrition will improve Outcome: Progressing Goal: Maintenance of adequate weight for body size and type will improve Outcome: Progressing   Problem: Respiratory: Goal: Will regain and/or maintain adequate ventilation Outcome: Progressing   Problem: Urinary Elimination: Goal: Ability to achieve and maintain adequate renal perfusion and functioning will improve Outcome: Progressing   Problem: Health Behavior/Discharge Planning: Goal: Ability to manage health-related needs will improve Outcome: Progressing   Problem: Clinical Measurements: Goal: Ability to maintain clinical measurements within normal limits will improve Outcome: Progressing Goal: Cardiovascular complication will be avoided Outcome: Progressing   Problem: Activity: Goal: Risk for activity intolerance will decrease Outcome: Progressing   Problem: Nutrition: Goal: Adequate nutrition will be maintained Outcome: Progressing   Problem: Coping: Goal: Level of anxiety will decrease Outcome: Progressing   Problem: Elimination: Goal: Will not experience complications related to bowel motility Outcome: Progressing Goal: Will not experience complications related to urinary retention Outcome: Progressing   Problem: Pain Managment: Goal: General experience of comfort will improve Outcome: Progressing    Problem: Safety: Goal: Ability to remain free from injury will improve Outcome: Progressing   Problem: Skin Integrity: Goal: Risk for impaired skin integrity will decrease Outcome: Progressing   Problem: Education: Goal: Ability to describe self-care measures that may prevent or decrease complications (Diabetes Survival Skills Education) will improve Outcome: Progressing Goal: Individualized Educational Video(s) Outcome: Progressing   Problem: Coping: Goal: Ability to adjust to condition or change in health will improve Outcome: Progressing   Problem: Fluid Volume: Goal: Ability to maintain a balanced intake and output will improve Outcome: Progressing   Problem: Health Behavior/Discharge Planning: Goal: Ability to identify and utilize available resources and services will improve Outcome: Progressing Goal: Ability to manage health-related needs will improve Outcome: Progressing   Problem: Metabolic: Goal: Ability to maintain appropriate glucose levels will improve Outcome: Progressing   Problem: Nutritional: Goal: Maintenance of adequate nutrition will improve Outcome: Progressing Goal: Progress toward achieving an optimal weight will improve Outcome: Progressing   Problem: Skin Integrity: Goal: Risk for impaired skin integrity will decrease Outcome: Progressing   Problem: Tissue Perfusion: Goal: Adequacy of tissue perfusion will improve Outcome: Progressing   Problem: Education: Goal: Individualized Educational Video(s) Outcome: Progressing   Problem: Fluid Volume: Goal: Compliance with measures to maintain balanced fluid volume will improve Outcome: Progressing   Problem: Health Behavior/Discharge Planning: Goal: Ability to manage health-related needs will improve Outcome: Progressing   Problem: Nutritional: Goal: Ability to make healthy dietary choices will improve Outcome: Progressing   Problem: Clinical Measurements: Goal: Complications related  to the disease process, condition or treatment will be avoided or minimized Outcome: Progressing

## 2023-01-28 NOTE — Progress Notes (Signed)
[facility] Progress Note   Subjective:   Tolerated HD yesterday with UF limited by hypotension, net UF 1.1L. Pt reports she is still nauseated. Reports she has been having some SOB today. Denies dizziness, CP, palpitations.    Objective Vitals:   01/27/23 2036 01/28/23 0531 01/28/23 0713 01/28/23 0733  BP: 121/62 (!) 100/35  129/63  Pulse: 80 81  82  Resp: 16 16  18   Temp: 98.1 F (36.7 C) (!) 97.5 F (36.4 C)  98 F (36.7 C)  TempSrc: Oral Oral  Oral  SpO2: 100% 100%  100%  Weight:   78.2 kg    Physical Exam General: Chronically ill appearing female, alert and in NRA Heart: RRR, no murmurs, rubs or gallops Lungs: Diminished in bases but no rhonchi/rales auscultated, on O2 via [place], normal WOB Abdomen: Soft, non-distended, +BS Extremities: Trace edema L leg, R BKA Dialysis Access:  [facility]  Additional Objective Labs: Basic Metabolic Panel: Recent Labs  Lab 01/22/23 0850 01/23/23 1150 01/24/23 0559 01/26/23 0430 01/27/23 0554 01/28/23 0611  NA 133* 131*   < > 129* 130* 130*  K 4.1 3.6   < > 4.0 4.4 4.2  CL 98 96*   < > 94* 95* 94*  CO2 22 23   < > 25 23 25   GLUCOSE 248* 244*   < > 159* 271* 369*  BUN 45* 30*   < > 29* 51* 37*  CREATININE 4.42* 3.71*   < > 4.51* 5.85* 4.59*  CALCIUM 8.6* 8.4*   < > 9.1 8.9 8.6*  PHOS 4.6 4.6  --   --   --   --    < > = values in this interval not displayed.   Liver Function Tests: Recent Labs  Lab 01/22/23 0850 01/23/23 1150  ALBUMIN 2.5* 2.6*   No results for input(s): "LIPASE", "AMYLASE" in the last 168 hours. CBC: Recent Labs  Lab 01/24/23 0850 01/25/23 0506 01/27/23 0730  WBC 7.6 8.3 6.7  HGB 10.6* 10.4* 9.6*  HCT 35.8* 35.1* 32.4*  MCV 77.7* 75.8* 78.1*  PLT 271 206 198   Blood Culture    Component Value Date/Time   SDES TISSUE 11/14/2022 0919   SPECREQUEST NONE 11/14/2022 0919   CULT  11/14/2022 0919    FEW PROTEUS MIRABILIS RARE KLEBSIELLA PNEUMONIAE NO ANAEROBES ISOLATED FEW ENTEROCOCCUS  FAECALIS    REPTSTATUS 11/19/2022 FINAL 11/14/2022 0919    Cardiac Enzymes: No results for input(s): "CKTOTAL", "CKMB", "CKMBINDEX", "TROPONINI" in the last 168 hours. CBG: Recent Labs  Lab 01/27/23 1322 01/27/23 1633 01/27/23 2037 01/27/23 2231 01/28/23 0724  GLUCAP 142* 205* 285* 297* 387*   Iron Studies: No results for input(s): "IRON", "TIBC", "TRANSFERRIN", "FERRITIN" in the last 72 hours. @lablastinr3 @ Studies/Results: No results found. Medications:  promethazine (PHENERGAN) injection (IM or IVPB) 12.5 mg (01/28/23 0837)    amitriptyline  25 mg Oral QHS   apixaban  5 mg Oral BID   atorvastatin  80 mg Oral Daily   calcitRIOL  0.75 mcg Oral Daily   carvedilol  6.25 mg Oral BID WC   Chlorhexidine Gluconate Cloth  6 each Topical Q0600   clonazePAM  0.5 mg Oral BID   darbepoetin (ARANESP) injection - DIALYSIS  25 mcg Subcutaneous Q Tue-1800   ferric citrate  420 mg Oral TID WC   folic acid  1 mg Oral Daily   insulin aspart  0-5 Units Subcutaneous QHS   insulin aspart  0-9 Units Subcutaneous TID WC  insulin aspart  3 Units Subcutaneous TID WC   insulin glargine-yfgn  8 Units Subcutaneous BID   levothyroxine  75 mcg Oral QAC breakfast   melatonin  3 mg Oral QHS   nystatin  5 mL Oral QID   pantoprazole  40 mg Oral Daily   sucralfate  1 g Oral TID WC & HS   venlafaxine XR  75 mg Oral Q breakfast    Outpatient Dialysis Orders: TTS [facility]  4h  400/800   2/2 bath   [facility] TDC   Heparin 4000 + 2000 midrun  EDW was just lowered to 79 kg  - rocaltrol 0.75 mcg po three times per week - mircera 120 mcg  IV q 4 wks - last mircera given 8/10 (not given after discharge)  Just discharged 01/17/23   Assessment/Plan: # Intractable Nausea/vomiting  - per primary team/GI, CT with improving colitis - Possible gastroparesis, previously with considerable hyperglycemia.  Blood sugars are overall improved from that admission but still elevated   # ESRD - HD per TTS schedule  -  order is in for daily weights - AVF is not salvageable per prior vascular notes, using TDC   # HTN and volume   - Anasarca noted on prior admission, weight trending back up a bit over the past two days - optimize volume status with HD, however UF is limited by hypotension - note that she reports that she takes midodrine prior to outpatient HD and then was instructed to bring a dose to the clinic in case she needs another dose - we have given midodrine PRN here and have used albumin as needed. Will schedule midodrine pre-HD and order albumin PRN    # Hyponatremia  - currently 130 -optimizing volume with HD, see above   # Anemia of CKD  - Hgb 9.6. started aranesp on 01/27/23   # Metabolic bone disease  - on renvela - given concern for possible GI motility disorder will stop.  She would benefit from a new binder.   - Start auryxia with meals  - continue calcitriol 0.75 mcg three times a week  Rogers Blocker, PA-C 01/28/2023, 10:46 AM  [facility] Pager: [phone]

## 2023-01-29 DIAGNOSIS — F32A Depression, unspecified: Secondary | ICD-10-CM

## 2023-01-29 DIAGNOSIS — N186 End stage renal disease: Secondary | ICD-10-CM | POA: Diagnosis not present

## 2023-01-29 LAB — GLUCOSE, CAPILLARY
Glucose-Capillary: 435 mg/dL — ABNORMAL HIGH (ref 70–99)
Glucose-Capillary: 181 mg/dL — ABNORMAL HIGH (ref 70–99)
Glucose-Capillary: 204 mg/dL — ABNORMAL HIGH (ref 70–99)
Glucose-Capillary: 244 mg/dL — ABNORMAL HIGH (ref 70–99)

## 2023-01-29 LAB — COMPREHENSIVE METABOLIC PANEL
ALT: 13 U/L (ref 0–44)
AST: 14 U/L — ABNORMAL LOW (ref 15–41)
Albumin: 2.9 g/dL — ABNORMAL LOW (ref 3.5–5.0)
Alkaline Phosphatase: 137 U/L — ABNORMAL HIGH (ref 38–126)
Anion gap: 8 (ref 5–15)
BUN: 60 mg/dL — ABNORMAL HIGH (ref 8–23)
CO2: 25 mmol/L (ref 22–32)
Calcium: 8.8 mg/dL — ABNORMAL LOW (ref 8.9–10.3)
Chloride: 94 mmol/L — ABNORMAL LOW (ref 98–111)
Creatinine, Ser: 5.82 mg/dL — ABNORMAL HIGH (ref 0.44–1.00)
GFR, Estimated: 7 mL/min — ABNORMAL LOW (ref >60)
Glucose, Bld: 490 mg/dL — ABNORMAL HIGH (ref 70–99)
Potassium: 4 mmol/L (ref 3.5–5.1)
Sodium: 127 mmol/L — ABNORMAL LOW (ref 135–145)
Total Bilirubin: 0.4 mg/dL (ref 0.3–1.2)
Total Protein: 5.8 g/dL — ABNORMAL LOW (ref 6.5–8.1)

## 2023-01-29 LAB — CBC WITH DIFFERENTIAL/PLATELET
Abs Immature Granulocytes: 0.01 10*3/uL (ref 0.00–0.07)
Basophils Absolute: 0.1 10*3/uL (ref 0.0–0.1)
Basophils Relative: 1 %
Eosinophils Absolute: 0.3 10*3/uL (ref 0.0–0.5)
Eosinophils Relative: 6 %
HCT: 30.8 % — ABNORMAL LOW (ref 36.0–46.0)
Hemoglobin: 9.1 g/dL — ABNORMAL LOW (ref 12.0–15.0)
Immature Granulocytes: 0 %
Lymphocytes Relative: 15 %
Lymphs Abs: 0.7 10*3/uL (ref 0.7–4.0)
MCH: 22.9 pg — ABNORMAL LOW (ref 26.0–34.0)
MCHC: 29.5 g/dL — ABNORMAL LOW (ref 30.0–36.0)
MCV: 77.4 fL — ABNORMAL LOW (ref 80.0–100.0)
Monocytes Absolute: 0.4 10*3/uL (ref 0.1–1.0)
Monocytes Relative: 8 %
Neutro Abs: 3.2 10*3/uL (ref 1.7–7.7)
Neutrophils Relative %: 70 %
Platelets: 166 10*3/uL (ref 150–400)
RBC: 3.98 MIL/uL (ref 3.87–5.11)
RDW: 18.4 % — ABNORMAL HIGH (ref 11.5–15.5)
WBC: 4.5 10*3/uL (ref 4.0–10.5)
nRBC: 0 % (ref 0.0–0.2)

## 2023-01-29 LAB — RENAL FUNCTION PANEL
Albumin: 2.9 g/dL — ABNORMAL LOW (ref 3.5–5.0)
Anion gap: 9 (ref 5–15)
BUN: 60 mg/dL — ABNORMAL HIGH (ref 8–23)
CO2: 24 mmol/L (ref 22–32)
Calcium: 8.8 mg/dL — ABNORMAL LOW (ref 8.9–10.3)
Chloride: 93 mmol/L — ABNORMAL LOW (ref 98–111)
Creatinine, Ser: 5.74 mg/dL — ABNORMAL HIGH (ref 0.44–1.00)
GFR, Estimated: 7 mL/min — ABNORMAL LOW (ref >60)
Glucose, Bld: 490 mg/dL — ABNORMAL HIGH (ref 70–99)
Phosphorus: 2.5 mg/dL (ref 2.5–4.6)
Potassium: 4 mmol/L (ref 3.5–5.1)
Sodium: 126 mmol/L — ABNORMAL LOW (ref 135–145)

## 2023-01-29 LAB — PHOSPHORUS: Phosphorus: 2.5 mg/dL (ref 2.5–4.6)

## 2023-01-29 LAB — MAGNESIUM: Magnesium: 1.7 mg/dL (ref 1.7–2.4)

## 2023-01-29 MED ORDER — INSULIN ASPART 100 UNIT/ML IJ SOLN
0.0000 [IU] | Freq: Every day | INTRAMUSCULAR | Status: DC
  Administered 2023-01-29: 2 [IU] via SUBCUTANEOUS

## 2023-01-29 MED ORDER — HEPARIN SODIUM (PORCINE) 1000 UNIT/ML DIALYSIS
1000.0000 [IU] | INTRAMUSCULAR | Status: DC | PRN
  Administered 2023-01-29: 1000 [IU]
  Filled 2023-01-29 (×2): qty 1

## 2023-01-29 MED ORDER — INSULIN ASPART 100 UNIT/ML IJ SOLN
5.0000 [IU] | Freq: Once | INTRAMUSCULAR | Status: AC
  Administered 2023-01-29: 5 [IU] via SUBCUTANEOUS

## 2023-01-29 MED ORDER — INSULIN GLARGINE-YFGN 100 UNIT/ML ~~LOC~~ SOLN
15.0000 [IU] | Freq: Two times a day (BID) | SUBCUTANEOUS | Status: DC
  Administered 2023-01-29: 15 [IU] via SUBCUTANEOUS
  Filled 2023-01-29 (×3): qty 0.15

## 2023-01-29 MED ORDER — POLYETHYLENE GLYCOL 3350 17 G PO PACK
17.0000 g | PACK | Freq: Every day | ORAL | Status: DC
  Administered 2023-02-02: 17 g via ORAL
  Filled 2023-01-29 (×3): qty 1

## 2023-01-29 MED ORDER — LIDOCAINE-PRILOCAINE 2.5-2.5 % EX CREA: 1.0000 | TOPICAL_CREAM | CUTANEOUS | Status: DC | PRN

## 2023-01-29 MED ORDER — ALTEPLASE 2 MG IJ SOLR: 2.0000 mg | Freq: Once | INTRAMUSCULAR | Status: DC | PRN

## 2023-01-29 MED ORDER — GERHARDT'S BUTT CREAM
TOPICAL_CREAM | Freq: Two times a day (BID) | CUTANEOUS | Status: DC
  Filled 2023-01-29: qty 1

## 2023-01-29 MED ORDER — ANTICOAGULANT SODIUM CITRATE 4% (200MG/5ML) IV SOLN: 5.0000 mL | Status: DC | PRN

## 2023-01-29 MED ORDER — BISACODYL 10 MG RE SUPP
10.0000 mg | Freq: Once | RECTAL | Status: AC | PRN
  Administered 2023-01-30: 10 mg via RECTAL
  Filled 2023-01-29: qty 1

## 2023-01-29 MED ORDER — PENTAFLUOROPROP-TETRAFLUOROETH EX AERO: 1.0000 | INHALATION_SPRAY | CUTANEOUS | Status: DC | PRN

## 2023-01-29 MED ORDER — INSULIN ASPART 100 UNIT/ML IJ SOLN
0.0000 [IU] | Freq: Three times a day (TID) | INTRAMUSCULAR | Status: DC
  Administered 2023-01-29: 3 [IU] via SUBCUTANEOUS
  Administered 2023-01-29: 5 [IU] via SUBCUTANEOUS
  Administered 2023-01-30: 11 [IU] via SUBCUTANEOUS
  Administered 2023-01-30: 15 [IU] via SUBCUTANEOUS
  Administered 2023-01-31: 5 [IU] via SUBCUTANEOUS

## 2023-01-29 MED ORDER — LIDOCAINE HCL (PF) 1 % IJ SOLN: 5.0000 mL | INTRAMUSCULAR | Status: DC | PRN

## 2023-01-29 NOTE — Progress Notes (Signed)
Initial Nutrition Assessment  DOCUMENTATION CODES:   Severe malnutrition in context of chronic illness  INTERVENTION:  Nepro Shake po BID, each supplement provides 425 kcal and 19 grams protein  Continue to manage patient's nausea with phenergan   Continue managing CBG   NUTRITION DIAGNOSIS:   Severe Malnutrition related to chronic illness as evidenced by energy intake < or equal to 75% for > or equal to 1 month, moderate muscle depletion, moderate fat depletion, percent weight loss.   GOAL:   Patient will meet greater than or equal to 90% of their needs   MONITOR:   Supplement acceptance, Weight trends, PO intake, Skin, I & O's, Labs  REASON FOR ASSESSMENT:   Consult Assessment of nutrition requirement/status  ASSESSMENT:   72 y.o. female with PMHx including ESRD on HD, PVD s/p BKA, WC bound, hypothyroidism, morbid obesity, CAD, T2DM, and Afib who presents with N/V and inability to tolerate po along with lower abdominal pain  Patient recently admitted for similar symptoms-- N/V/D and early DKA. Patient was found to have proctocolitis   Patient's glucose was almost 500 this morning   Visited patient at bedside who reports she eats well when her nausea is controlled. Patient's nausea reported to be best controlled with phenergan. She is also constipated. Patient denies vomiting recently but reports dry heaving. RN reports she is to start patient on a bowel regimen.   She lives at home with her husband and neither of them cook. Patient reports her husband does the grocery shopping and they eat a lot of TV dinners which she knows are not good for her. Etiology of nausea is multifactorial.    Labs: CBG 181, Na 127, BUN 60, Cr 5.82, alk phos 137 Meds: lipitor, rocaltrol, klonopin, ferric citrate,l folvite, insulin, synthroid, remeron, mycostain, protonix, carafate, effexor-XR   Wt: 30.3 kg (28%) wt loss x 3 months, some wt loss could be fluid related  01/29/23 78.6 kg   01/17/23 79.3 kg  12/26/22 82.1 kg  12/11/22 82.2 kg  11/20/22 98.5 kg  11/07/22 113.9 kg  10/29/22 108.9 kg   PO: 38% avg meal intake x last 6 documented meals   I/O's:  -6.1 L (net cumulative)   NUTRITION - FOCUSED PHYSICAL EXAM:  Flowsheet Row Most Recent Value  Orbital Region Moderate depletion  Buccal Region Moderate depletion  Temple Region Moderate depletion  Clavicle Bone Region Moderate depletion  Dorsal Hand Severe depletion  Patellar Region Moderate depletion  Anterior Thigh Region No depletion  Posterior Calf Region Moderate depletion  Edema (RD Assessment) None  Hair Reviewed  Eyes Reviewed  Mouth Reviewed  Skin Reviewed  Nails Reviewed       Diet Order:   Diet Order             Diet Carb Modified Fluid consistency: Thin  Diet effective now                   EDUCATION NEEDS:   Education needs have been addressed  Skin:  Skin Assessment: Skin Integrity Issues: Skin Integrity Issues:: Stage II, Diabetic Ulcer Stage II: coccyx, sacrum Diabetic Ulcer: L pretibial  Last BM:  9/1 type 6  Height:   Ht Readings from Last 1 Encounters:  12/26/22 5\' 5"  (1.651 m)    Weight:   Wt Readings from Last 1 Encounters:  01/29/23 78.6 kg    Ideal Body Weight:     BMI:  Body mass index is 28.84 kg/m.  Estimated Nutritional Needs:  Kcal:  1950-2300  Protein:  90-120  Fluid:  1L + UOP    Leodis Rains, RDN, LDN  Clinical Nutrition

## 2023-01-29 NOTE — Progress Notes (Signed)
   01/29/23 1207  Vitals  Temp 97.7 F (36.5 C)  Pulse Rate 76  Resp 18  BP 116/85  SpO2 100 %  Weight 78.6 kg  Type of Weight Post-Dialysis  Oxygen Therapy  O2 Flow Rate (L/min) 3 L/min  Patient Activity (if Appropriate) In bed  Pulse Oximetry Type Continuous  Post Treatment  Dialyzer Clearance Lightly streaked  Hemodialysis Intake (mL) 0 mL  Liters Processed 84  Fluid Removed (mL) 2500 mL  Tolerated HD Treatment Yes   Received patient in bed to unit.  Alert and oriented.  Informed consent signed and in chart.   TX duration:3.5hrs  Patient tolerated well.  Transported back to the room  Alert, without acute distress.  Hand-off given to patient's nurse.   Access used: [facility]  Access issues: none  Total UF removed: 2.5L Medication(s) given: none    [facility]

## 2023-01-29 NOTE — Progress Notes (Signed)
PROGRESS NOTE    Susan [name]  ZOX:[id number] DOB: 12-30-1950 DOA: 01/20/2023 PCP: Donita Brooks, MD  Chief Complaint  Patient presents with   Nausea   Emesis    Brief Narrative:   72-year-old chronically sick lady with history of ESRD on hemodialysis TTS schedule, peripheral vascular disease status post right BKA, recent left wound debridement and skin grafting by Dr. 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  In the emergency room afebrile.  WBC count normal.  Glucose 309.  Upper quadrant ultrasound with cholelithiasis without evidence of acute cholecystitis.  CT scan consistent with pulmonary vascular congestion, interstitial prominence, small bilateral pleural effusion.  EKG with right bundle branch block, sinus rhythm, QTc 540.  Due to persistent nausea vomiting, she was given Ativan, Zofran and promethazine.  Patient remained in the hospital due to persistent nausea. 9/3: Patient came back from hemodialysis.  Dramatically patient ate 100% of her lunch without using any nausea medications.  Assessment & Plan:   Principal Problem:   ESRD (end stage renal disease) (HCC) Active Problems:   MDD (major depressive disorder), recurrent episode, moderate (HCC)   Historical diagnosis of generalized anxiety disorder   Secondary insomnia   Intractable nausea and vomiting   Nausea   Depression  Intractable nausea and vomiting with recent episode of colitis: Persistent symptoms.  CT scan with evidence of improving colitis.   Unclear cause Patient with prolonged QTc, limitation on using antiemetics. Currently on PPI, ativan prn, occasional use of Phenergan. Followed by GI.  Recommended conservative management.  On nystatin swish and swallow. Allowing regular diet so  she can have more choices. Seems to be improving CT scan, right upper quadrant ultrasound without any acute findings. Appreciate psychiatry assistance (remeron)   ESRD HD per renal  Chronic systolic congestive heart failure: volume per renal with HD Paroxysmal Savina Olshefski-fib, currently in sinus rhythm.  On Coreg and Eliquis. Coronary artery disease, status post stent.  Stable. Hypertension, coreg, has midodrine on TTS with dialysis Anemia of chronic disease, hemoglobin stable Hypothyroidism, on synthroid Depression  Anxiety continue venlafaxine, clonazepam - Remeron added by psychiatry.  Psych recommending remeron, melatonin, venlafaxine, clonazepam. They've now signed off.    Type 2 diabetes, hyperglycemia.  Basal, bolus, and SSI - adjust prn  Significantly hyperglycemic this AM   Peripheral vascular disease Status post right BKA.   Status post debridement and fish skin grafting left leg: Local wound care.  Dry dressing. Dopplerable PT pulse on left, needs outpatient vascular follow up Eliquis, lipitor    Prolonged Qtc  RBBB: Limits our ability to use nausea medicines.  Chronic stable prolonged QTc.  Currently using prn phenergan. Will repeat EKG  -> 516 (with prolonged QRS, this likely overestimates) Repeat EKG 9/2, QTc 521.     DVT prophylaxis: eliquis Code Status: full Family Communication: none Disposition:   Status is: Inpatient Remains inpatient appropriate because: continued need for inpatient care   Consultants:  Psychiatry nephrology  Procedures:  none  Antimicrobials:  Anti-infectives (From admission, onward)    None       Subjective: Seems to be doing better today, tolerating more PO  Objective: Vitals:   01/29/23 1157 01/29/23 1207 01/29/23 1242 01/29/23 1547  BP: 119/85 116/85 (!) 109/95 (!) 117/53  Pulse: 73 76 79 80  Resp: 18 18 18  Temp:  97.7 F (36.5 C) 98.4 F (36.9 C) (!) 97 F (36.1 C)  TempSrc:   Oral Oral  SpO2: 100% 100% 100% 100%   Weight:  78.6 kg      Intake/Output Summary (Last 24 hours) at 01/29/2023 1652 Last data filed at 01/29/2023 1207 Gross per 24 hour  Intake 120 ml  Output 2500 ml  Net -2380 ml   Filed Weights   01/29/23 0500 01/29/23 0758 01/29/23 1207  Weight: 78.4 kg 80.6 kg 78.6 kg    Examination:  General: No acute distress. Seen in transfer from dialysis. Lungs: unlabroed Abdomen: Soft, nontender, nondistended  Neurological: Alert and oriented 3. Moves all extremities 4 with equal strength. Cranial nerves II through XII grossly intact. Extremities: No clubbing or cyanosis. No edema  Data Reviewed: I have personally reviewed following labs and imaging studies  CBC: Recent Labs  Lab 01/24/23 0850 01/25/23 0506 01/27/23 0730 01/29/23 0820  WBC 7.6 8.3 6.7 4.5  NEUTROABS  --   --   --  3.2  HGB 10.6* 10.4* 9.6* 9.1*  HCT 35.8* 35.1* 32.4* 30.8*  MCV 77.7* 75.8* 78.1* 77.4*  PLT 271 206 198 166    Basic Metabolic Panel: Recent Labs  Lab 01/22/23 2126 01/23/23 1150 01/23/23 1150 01/24/23 0559 01/25/23 0506 01/26/23 0430 01/27/23 0554 01/28/23 0611 01/29/23 0820  NA  --  131*   < > 129* 133* 129* 130* 130* 127*  126*  K  --  3.6   < > 4.2 4.5 4.0 4.4 4.2 4.0  4.0  CL  --  96*   < > 91* 97* 94* 95* 94* 94*  93*  CO2  --  23   < > 21* 22 25 23 25 25  24   GLUCOSE  --  244*   < > 360* 379* 159* 271* 369* 490*  490*  BUN  --  30*   < > 36* 20 29* 51* 37* 60*  60*  CREATININE  --  3.71*   < > 4.57* 3.91* 4.51* 5.85* 4.59* 5.82*  5.74*  CALCIUM  --  8.4*   < > 8.9 9.1 9.1 8.9 8.6* 8.8*  8.8*  MG 1.6*  --   --  1.9  --   --   --   --  1.7  PHOS  --  4.6  --   --   --   --   --   --  2.5  2.5   < > = values in this interval not displayed.    GFR: Estimated Creatinine Clearance: 8.9 mL/min (Daveyon Kitchings) (by C-G formula based on SCr of 5.82 mg/dL (H)).  Liver Function Tests: Recent Labs  Lab 01/23/23 1150 01/29/23 0820  AST  --  14*  ALT  --  13  ALKPHOS  --  137*   BILITOT  --  0.4  PROT  --  5.8*  ALBUMIN 2.6* 2.9*  2.9*    CBG: Recent Labs  Lab 01/28/23 1613 01/28/23 2037 01/29/23 0714 01/29/23 1240 01/29/23 1626  GLUCAP 349* 379* 435* 181* 204*     Recent Results (from the past 240 hour(s))  MRSA Next Gen by PCR, Nasal     Status: None   Collection Time: 01/20/23  6:56 PM   Specimen: Nasal Mucosa; Nasal Swab  Result Value Ref Range Status   MRSA by PCR Next Gen NOT DETECTED NOT DETECTED Final    Comment: (NOTE) The GeneXpert MRSA Assay (FDA approved for NASAL  specimens only), is one component of [facility] comprehensive MRSA colonization surveillance program. It is not intended to diagnose MRSA infection nor to guide or monitor treatment for MRSA infections. Test performance is not FDA approved in patients less than 82 years old. Performed at [facility], [address] [place], [place] [address]          Radiology Studies: No results found.      Scheduled Meds:  apixaban  5 mg Oral BID   atorvastatin  80 mg Oral Daily   calcitRIOL  0.75 mcg Oral Daily   carvedilol  6.25 mg Oral BID WC   Chlorhexidine Gluconate Cloth  6 each Topical Q0600   Chlorhexidine Gluconate Cloth  6 each Topical Q0600   clonazePAM  0.5 mg Oral BID   darbepoetin (ARANESP) injection - DIALYSIS  25 mcg Subcutaneous Q Tue-1800   ferric citrate  420 mg Oral TID WC   folic acid  1 mg Oral Daily   insulin aspart  0-15 Units Subcutaneous TID WC   insulin aspart  0-5 Units Subcutaneous QHS   insulin aspart  5 Units Subcutaneous TID WC   insulin glargine-yfgn  15 Units Subcutaneous BID   levothyroxine  75 mcg Oral QAC breakfast   melatonin  3 mg Oral QHS   midodrine  5 mg Oral Q T,Th,Sa-HD   mirtazapine  7.5 mg Oral QHS   nystatin  5 mL Oral QID   pantoprazole  40 mg Oral Daily   sucralfate  1 g Oral TID WC & HS   venlafaxine XR  75 mg Oral Q breakfast   Continuous Infusions:  promethazine (PHENERGAN) injection (IM or IVPB) 12.5 mg  (01/29/23 1258)     LOS: 9 days    Time spent: over 30 min    Lacretia Nicks, MD [facility]   To contact the attending provider between 7A-7P or the covering provider during after hours 7P-7A, please log into the web site [email or web address] and access using universal [place] password for that web site. If you do not have the password, please call the hospital operator.  01/29/2023, 4:52 PM

## 2023-01-29 NOTE — Progress Notes (Signed)
OT Cancellation Note  Patient Details Name: Susan [name] MRN: [id number] DOB: September 14, 1950   Cancelled Treatment:    Reason Eval/Treat Not Completed: (P) Fatigue/lethargy limiting ability to participate, just got back from HD, too tired to participate, will re-attempt later.  Alexis Goodell 01/29/2023, 1:06 PM

## 2023-01-29 NOTE — Plan of Care (Signed)
  Problem: Cardiac: Goal: Ability to maintain an adequate cardiac output will improve Outcome: Progressing   Problem: Health Behavior/Discharge Planning: Goal: Ability to identify and utilize available resources and services will improve Outcome: Progressing Goal: Ability to manage health-related needs will improve Outcome: Progressing   Problem: Fluid Volume: Goal: Ability to achieve a balanced intake and output will improve Outcome: Progressing   Problem: Metabolic: Goal: Ability to maintain appropriate glucose levels will improve Outcome: Progressing   Problem: Nutritional: Goal: Maintenance of adequate nutrition will improve Outcome: Progressing Goal: Maintenance of adequate weight for body size and type will improve Outcome: Progressing   Problem: Respiratory: Goal: Will regain and/or maintain adequate ventilation Outcome: Progressing   Problem: Urinary Elimination: Goal: Ability to achieve and maintain adequate renal perfusion and functioning will improve Outcome: Progressing   Problem: Health Behavior/Discharge Planning: Goal: Ability to manage health-related needs will improve Outcome: Progressing   Problem: Clinical Measurements: Goal: Ability to maintain clinical measurements within normal limits will improve Outcome: Progressing Goal: Cardiovascular complication will be avoided Outcome: Progressing   Problem: Activity: Goal: Risk for activity intolerance will decrease Outcome: Progressing   Problem: Nutrition: Goal: Adequate nutrition will be maintained Outcome: Progressing   Problem: Coping: Goal: Level of anxiety will decrease Outcome: Progressing   Problem: Elimination: Goal: Will not experience complications related to bowel motility Outcome: Progressing Goal: Will not experience complications related to urinary retention Outcome: Progressing   Problem: Pain Managment: Goal: General experience of comfort will improve Outcome: Progressing    Problem: Safety: Goal: Ability to remain free from injury will improve Outcome: Progressing   Problem: Skin Integrity: Goal: Risk for impaired skin integrity will decrease Outcome: Progressing   Problem: Education: Goal: Ability to describe self-care measures that may prevent or decrease complications (Diabetes Survival Skills Education) will improve Outcome: Progressing Goal: Individualized Educational Video(s) Outcome: Progressing   Problem: Coping: Goal: Ability to adjust to condition or change in health will improve Outcome: Progressing   Problem: Fluid Volume: Goal: Ability to maintain a balanced intake and output will improve Outcome: Progressing   Problem: Health Behavior/Discharge Planning: Goal: Ability to identify and utilize available resources and services will improve Outcome: Progressing Goal: Ability to manage health-related needs will improve Outcome: Progressing   Problem: Metabolic: Goal: Ability to maintain appropriate glucose levels will improve Outcome: Progressing   Problem: Nutritional: Goal: Maintenance of adequate nutrition will improve Outcome: Progressing Goal: Progress toward achieving an optimal weight will improve Outcome: Progressing   Problem: Skin Integrity: Goal: Risk for impaired skin integrity will decrease Outcome: Progressing   Problem: Tissue Perfusion: Goal: Adequacy of tissue perfusion will improve Outcome: Progressing   Problem: Education: Goal: Individualized Educational Video(s) Outcome: Progressing   Problem: Fluid Volume: Goal: Compliance with measures to maintain balanced fluid volume will improve Outcome: Progressing   Problem: Health Behavior/Discharge Planning: Goal: Ability to manage health-related needs will improve Outcome: Progressing   Problem: Nutritional: Goal: Ability to make healthy dietary choices will improve Outcome: Progressing   Problem: Clinical Measurements: Goal: Complications related  to the disease process, condition or treatment will be avoided or minimized Outcome: Progressing

## 2023-01-29 NOTE — Progress Notes (Signed)
Blood glucose 435, Dr. Lowell Guitar made aware, regular insulin and meal coverage given as per order, long acting insulin given prior to dialysis, pt. Eating breakfast before leaving for dialysis  Margarita Grizzle

## 2023-01-29 NOTE — Consult Note (Signed)
[name] Psychiatry Consult Note   Service Date: January 29, 2023 LOS:  LOS: 9 days  MRN: [id number] Type of consult:  Followup   Primary Psychiatric Diagnoses  MDD, recurrent, moderate Historical diagnosis of generalized anxiety disorder with panic attacks Secondary insomnia  Assessment  Susan [name] is a 72 y.o. female with a past psychiatric history of MDD and GAD with panic attacks and significant PMHx of  ESRD on HD, hypothyroidism, morbid obesity, CAD, and a-fib . Psychiatry was consulted for "persistent nausea, no organic cause. psychogenic symptoms?" by [name], MD.   Patient was seen and interviewed while undergoing dialysis treatment. She was profoundly somnolent likely from the physical toll of dialysis treatment but did rouse to voice and answered a few questions. Bedside dialysis nurse confirms patient has been less nauseous and has not received nausea medication lately. Suspect patient's persistent nausea was organic in nature and improved with time and medical treatment. Psychiatry team added mirtazapine to help patient sleep better. Patient can follow-up with outpatient psychiatry for further mental health needs - no further inpatient psychiatric needs at this time.  Please see plan below for detailed recommendations.   Diagnoses:  Active Hospital problems: Principal Problem:   ESRD (end stage renal disease) (HCC) Active Problems:   MDD (major depressive disorder), recurrent episode, moderate (HCC)   Historical diagnosis of generalized anxiety disorder   Secondary insomnia   Intractable nausea and vomiting   Nausea  Plan and Recommendations  ## Interventions (medications, psychoeducation, etc):  -- continue mirtazapine 7.5 mg daily at bedtime for antidepressant augmentation with favorable side-effects of sedation and appetite stimulation -- continue melatonin 3 mg at bedtime for insomnia -- continue venlafaxine XR 24-hr capsule 75 mg daily for  depressive symptoms -- continue clonazepam dissolvable tablet 0.5 mg twice daily for anxiety -- discontinue primary team prescribed amitriptyline for treatment of insomnia -- recommend outpatient psychiatry follow-up  ## Further Work-up:  -- none  -- most recent EKG on 521 had QtC of 01/26/2023 -- Pertinent labwork reviewed earlier this admission includes: CBC, CMP  ## Medical Decision Making Capacity:  - Not formally assessed during this encounter  ## Disposition:  - No indication for inpatient psychiatric admission. Recommending outpatient psychiatry / psychology follow-up. Defer immediate disposition plan to primary team.  ## Behavioral / Environmental:  -- Standard delirium precautions and Fall precautions  ##Legal Status -- Patient voluntary  Thank you for this consult request. Our recommendations are listed above. We will sign-off at this time  ## Safety and Observation Level:  - Based on my clinical evaluation, I estimate the patient to be at low risk of self harm in the current setting - At this time, we recommend a no additional level of observation. This decision is based on my review of the chart including patient's history and current presentation, interview of the patient, mental status examination, and consideration of suicide risk including evaluating suicidal ideation, plan, intent, suicidal or self-harm behaviors, risk factors, and protective factors. This judgment is based on our ability to directly address suicide risk, implement suicide prevention strategies and develop a safety plan while the patient is in the clinical setting. Please contact our team if there is a concern that risk level has changed.  [name], MD  History Obtained on Initial Interview  Relevant Aspects of Hospital Course:  Admitted on 01/20/2023 for recurrent n/v and inability to take PO.  Patient Report:  9/4 Patient says she felt nauseous again last night but  does not know what's causing  it. She says she's anxious sometimes, but reports she's more depressed than anxious. She endorses "months" of feeling persistently low mood, insomnia, poor appetite, lack of energy, poor concentration, and having no motivation to get out of bed. She denies any suicidal thoughts.  She reports going to sleep at 2:30am and waking up at 4:30am this morning.  She says she does not know if she has tried trazodone before but is quite certain she has not tried mirtazapine. I discussed the risks, benefits, and common side effects of mirtazapine with patient - she was amenable to trying it to treat her depression, insomnia, and lack of appetite.  9/5 Patient says mirtazapine has been helpful for her sleep. She says she has not felt nauseous since yesterday. She says she feels a little less depressed today.  Psychiatric ROS:  Depression: meets criteria for MDD (see above patient report) Mania: denies history of persistently elevated mood Anxiety: "sometimes" Psychosis: denies AVH, no delusions noted  Psychiatric and Social History   Psychiatric History  Information collected from patient, collateral contact/s, and available chart review  Prev Dx/Sx: Depression, anxiety Current Psych Provider: PCP Home Meds (current): trintillex 20 mg, lorazepam 0.5 mg QHS Previous Med Trials: lexapro, zoloft, prozac (briefly effective), wellbutrin (unclear efficacy).  Therapy: no     Prior Psych Hospitalization: no  Prior Self Harm: no Prior Violence: no   Family Psych History: Son attempted suicide via GSW to head, now disabled.      Social History:   Educational Hx: high school Occupational Hx: office Legal Hx: no Living Situation: with husband of 24 years, son 3 days/week Spiritual Hx: yes, but not religious Access to weapons: yes - has guns in bedroom   Substance History Tobacco use: quit 20+ y ears ago Alcohol use: no Drug use: no   Other Histories  These are pulled from EMR and updated if  appropriate.   Family History:  The patient's family history includes Heart attack (age of onset: 60) in her mother.  Medical History: Past Medical History:  Diagnosis Date   Anemia    hx   Anxiety    Arthritis    "generalized" (03/15/2014)   CAD (coronary artery disease)    MI in 2000 - MI  2007 - treated bare metal stent (no nuclear since then as 9/11)   Carotid artery disease (HCC)    Chronic diastolic heart failure (HCC)    a) ECHO (08/2013) EF 55-60% and RV function nl b) RHC (08/2013) RA 4, RV 30/5/7, PA 25/10 (16), PCWP 7, Fick CO/CI 6.3/2.7, PVR 1.5 WU, PA 61 and 66%   Daily headache    "~ every other day; since I fell in June" (03/15/2014)   Depression    Diabetic retinopathy (HCC)    Dyslipidemia    ESRD (end stage renal disease) (HCC)    Dialysis on Tues Thurs Sat   Exertional shortness of breath    History of kidney stones    HTN (hypertension)    Hypothyroidism    Obesity    Osteoarthritis    PAF (paroxysmal atrial fibrillation) (HCC)    Peripheral neuropathy    bilateral feet/hands   PONV (postoperative nausea and vomiting)    RBBB (right bundle branch block)    Old   Stroke (HCC)    mini strokes   Type II diabetes mellitus (HCC)    Type II, [name]. patient has omnipod insulin pump with Novolin  R Insulin    Surgical History: Past Surgical History:  Procedure Laterality Date   A/V FISTULAGRAM Left 11/07/2022   Procedure: A/V Fistulagram;  Surgeon: Leonie Douglas, MD;  Location: [facility];  Service: Cardiovascular;  Laterality: Left;   ABDOMINAL HYSTERECTOMY  1980's   AMPUTATION Right 02/24/2018   Procedure: RIGHT FOOT GREAT TOE AND 2ND TOE AMPUTATION;  Surgeon: Nadara Mustard, MD;  Location: [facility] OR;  Service: Orthopedics;  Laterality: Right;   AMPUTATION Right 04/30/2018   Procedure: RIGHT TRANSMETATARSAL AMPUTATION;  Surgeon: Nadara Mustard, MD;  Location: [facility] OR;  Service: Orthopedics;  Laterality: Right;   AMPUTATION  Right 05/02/2022   Procedure: RIGHT BELOW KNEE AMPUTATION;  Surgeon: Nadara Mustard, MD;  Location: [facility] OR;  Service: Orthopedics;  Laterality: Right;   APPLICATION OF WOUND VAC Right 06/13/2022   Procedure: APPLICATION OF WOUND VAC;  Surgeon: Nadara Mustard, MD;  Location: [facility] OR;  Service: Orthopedics;  Laterality: Right;   APPLICATION OF WOUND VAC Left 11/14/2022   Procedure: APPLICATION OF WOUND VAC;  Surgeon: Nadara Mustard, MD;  Location: [facility] OR;  Service: Orthopedics;  Laterality: Left;   AV FISTULA PLACEMENT Left 04/02/2022   Procedure: LEFT ARM ARTERIOVENOUS (AV) FISTULA CREATION;  Surgeon: Nada Libman, MD;  Location: [facility] OR;  Service: Vascular;  Laterality: Left;  PERIPHERAL NERVE BLOCK   BASCILIC VEIN TRANSPOSITION Left 07/31/2022   Procedure: LEFT ARM SECOND STAGE BASILIC VEIN TRANSPOSITION;  Surgeon: Nada Libman, MD;  Location: [facility] OR;  Service: Vascular;  Laterality: Left;   BIOPSY  05/27/2020   Procedure: BIOPSY;  Surgeon: Lanelle Bal, DO;  Location: [facility];  Service: Endoscopy;;   CATARACT EXTRACTION, BILATERAL Bilateral ?2013   COLONOSCOPY W/ POLYPECTOMY     COLONOSCOPY WITH PROPOFOL N/A 03/13/2019   Procedure: COLONOSCOPY WITH PROPOFOL;  Surgeon: Beverley Fiedler, MD;  Location: [facility] ENDOSCOPY;  Service: Gastroenterology;  Laterality: N/A;   CORONARY ANGIOPLASTY WITH STENT PLACEMENT  1999; 2007   "1 + 1"   ERCP N/A 02/03/2022   Procedure: ENDOSCOPIC RETROGRADE CHOLANGIOPANCREATOGRAPHY (ERCP);  Surgeon: Jeani Hawking, MD;  Location: [facility] ENDOSCOPY;  Service: Gastroenterology;  Laterality: N/A;   ESOPHAGOGASTRODUODENOSCOPY (EGD) WITH PROPOFOL N/A 03/13/2019   Procedure: ESOPHAGOGASTRODUODENOSCOPY (EGD) WITH PROPOFOL;  Surgeon: Beverley Fiedler, MD;  Location: [facility] ENDOSCOPY;  Service: Gastroenterology;  Laterality: N/A;   ESOPHAGOGASTRODUODENOSCOPY (EGD) WITH PROPOFOL N/A 05/27/2020   Procedure: ESOPHAGOGASTRODUODENOSCOPY (EGD) WITH PROPOFOL;  Surgeon: Lanelle Bal, DO;   [facility]: [facility];  Service: Endoscopy;  Laterality: N/A;   ESOPHAGOGASTRODUODENOSCOPY (EGD) WITH PROPOFOL N/A 09/03/2022   Procedure: ESOPHAGOGASTRODUODENOSCOPY (EGD) WITH PROPOFOL;  Surgeon: Jeani Hawking, MD;  Location: [facility] ENDOSCOPY;  Service: Gastroenterology;  Laterality: N/A;   EYE SURGERY Bilateral    lazer   FLEXIBLE SIGMOIDOSCOPY N/A 05/23/2022   Procedure: FLEXIBLE SIGMOIDOSCOPY;  Surgeon: Jeani Hawking, MD;  Location: [facility] ENDOSCOPY;  Service: Gastroenterology;  Laterality: N/A;   FLEXIBLE SIGMOIDOSCOPY N/A 05/24/2022   Procedure: FLEXIBLE SIGMOIDOSCOPY;  Surgeon: Imogene Burn, MD;  Location: [facility] ENDOSCOPY;  Service: Gastroenterology;  Laterality: N/A;   FLEXIBLE SIGMOIDOSCOPY N/A 09/03/2022   Procedure: FLEXIBLE SIGMOIDOSCOPY;  Surgeon: Jeani Hawking, MD;  Location: [facility] ENDOSCOPY;  Service: Gastroenterology;  Laterality: N/A;   HEMOSTASIS CLIP PLACEMENT  03/13/2019   Procedure: HEMOSTASIS CLIP PLACEMENT;  Surgeon: Beverley Fiedler, MD;  Location: [facility] ENDOSCOPY;  Service: Gastroenterology;;   HEMOSTASIS CLIP PLACEMENT  05/23/2022   Procedure: HEMOSTASIS CLIP PLACEMENT;  Surgeon: Elnoria Howard,  Luisa Hart, MD;  Location: [facility] ENDOSCOPY;  Service: Gastroenterology;;   HEMOSTASIS CONTROL  05/24/2022   Procedure: HEMOSTASIS CONTROL;  Surgeon: Imogene Burn, MD;  Location: [facility] ENDOSCOPY;  Service: Gastroenterology;;   HOT HEMOSTASIS N/A 05/23/2022   Procedure: HOT HEMOSTASIS (ARGON PLASMA COAGULATION/BICAP);  Surgeon: Jeani Hawking, MD;  Location: [facility] ENDOSCOPY;  Service: Gastroenterology;  Laterality: N/A;   I & D EXTREMITY Left 05/05/2022   Procedure: IRRIGATION AND DEBRIDEMENT LEFT ARM AV FISTULA;  Surgeon: Cephus Shelling, MD;  Location: [facility] OR;  Service: Vascular;  Laterality: Left;   I & D EXTREMITY N/A 11/14/2022   Procedure: IRRIGATION AND DEBRIDEMENT OF LOWER EXTREMITY WOUND;  Surgeon: Nadara Mustard, MD;  Location: [facility] OR;  Service: Orthopedics;  Laterality: N/A;   INSERTION OF DIALYSIS  CATHETER Right 04/02/2022   Procedure: INSERTION OF TUNNELED DIALYSIS CATHETER;  Surgeon: Nada Libman, MD;  Location: [facility] OR;  Service: Vascular;  Laterality: Right;   KNEE ARTHROSCOPY Left 10/25/2006   POLYPECTOMY  03/13/2019   Procedure: POLYPECTOMY;  Surgeon: Beverley Fiedler, MD;  Location: [facility] ENDOSCOPY;  Service: Gastroenterology;;   REMOVAL OF STONES  02/03/2022   Procedure: REMOVAL OF STONES;  Surgeon: Jeani Hawking, MD;  Location: [facility] ENDOSCOPY;  Service: Gastroenterology;;   REVISON OF ARTERIOVENOUS FISTULA Left 08/20/2022   Procedure: REVISON OF LEFT ARM ARTERIOVENOUS FISTULA;  Surgeon: Nada Libman, MD;  Location: [facility] OR;  Service: Vascular;  Laterality: Left;   RIGHT HEART CATH N/A 07/24/2017   Procedure: RIGHT HEART CATH;  Surgeon: Dolores Patty, MD;  [facility];  Service: Cardiovascular;  Laterality: N/A;   RIGHT HEART CATHETERIZATION N/A 09/22/2013   Procedure: RIGHT HEART CATH;  Surgeon: Dolores Patty, MD;  Location: [facility] CATH LAB;  Service: Cardiovascular;  Laterality: N/A;   SHOULDER ARTHROSCOPY WITH OPEN ROTATOR CUFF REPAIR Right 03/14/2014   Procedure: RIGHT SHOULDER ARTHROSCOPY WITH BICEPS RELEASE, OPEN SUBSCAPULA REPAIR, OPEN SUPRASPINATUS REPAIR.;  Surgeon: Cammy Copa, MD;  Location: [facility] OR;  Service: Orthopedics;  Laterality: Right;   SPHINCTEROTOMY  02/03/2022   Procedure: SPHINCTEROTOMY;  Surgeon: Jeani Hawking, MD;  Location: [facility] ENDOSCOPY;  Service: Gastroenterology;;   STUMP REVISION Right 06/13/2022   Procedure: REVISION RIGHT BELOW KNEE AMPUTATION;  Surgeon: Nadara Mustard, MD;  Location: [facility] OR;  Service: Orthopedics;  Laterality: Right;   TEE WITHOUT CARDIOVERSION N/A 02/04/2022   Procedure: TRANSESOPHAGEAL ECHOCARDIOGRAM (TEE);  Surgeon: Dolores Patty, MD;  Location: [facility] ENDOSCOPY;  Service: Cardiovascular;  Laterality: N/A;   THROMBECTOMY W/ EMBOLECTOMY Left 08/20/2022   Procedure: THROMBECTOMY OF LEFT ARM ARTERIOVENOUS FISTULA;   Surgeon: Nada Libman, MD;  Location: [facility] OR;  Service: Vascular;  Laterality: Left;   TOE AMPUTATION Right 02/24/2018   GREAT TOE AND 2ND TOE AMPUTATION   TUBAL LIGATION  1970's    Medications:   Current Facility-Administered Medications:    acetaminophen (TYLENOL) tablet 650 mg, 650 mg, Oral, Q6H PRN **OR** acetaminophen (TYLENOL) suppository 650 mg, 650 mg, Rectal, Q6H PRN, Lurline Del, MD   albuterol (PROVENTIL) (2.5 MG/3ML) 0.083% nebulizer solution 2.5 mg, 2.5 mg, Nebulization, Q2H PRN, Skip Mayer A, MD   alteplase (CATHFLO ACTIVASE) injection 2 mg, 2 mg, Intracatheter, Once PRN, Oretha Milch, PA-C   anticoagulant sodium citrate solution 5 mL, 5 mL, Intracatheter, PRN, Thomasena Edis, Samantha G, PA-C   apixaban (ELIQUIS) tablet 5 mg, 5 mg, Oral, BID, Estanislado Emms, MD, 5 mg at 01/28/23 2113   atorvastatin (LIPITOR) tablet  80 mg, 80 mg, Oral, Daily, Lurline Del, MD, 80 mg at 01/28/23 1610   calcitRIOL (ROCALTROL) capsule 0.75 mcg, 0.75 mcg, Oral, Daily, Estanislado Emms, MD, 0.75 mcg at 01/28/23 0837   carvedilol (COREG) tablet 6.25 mg, 6.25 mg, Oral, BID WC, Skip Mayer A, MD, 6.25 mg at 01/28/23 1713   Chlorhexidine Gluconate Cloth 2 % PADS 6 each, 6 each, Topical, Q0600, Estanislado Emms, MD, 6 each at 01/28/23 0524   Chlorhexidine Gluconate Cloth 2 % PADS 6 each, 6 each, Topical, Q0600, Oretha Milch, PA-C, 6 each at 01/28/23 1144   clonazePAM (KLONOPIN) disintegrating tablet 0.5 mg, 0.5 mg, Oral, BID, Skip Mayer A, MD, 0.5 mg at 01/28/23 2112   Darbepoetin Alfa (ARANESP) injection 25 mcg, 25 mcg, Subcutaneous, Q Tue-1800, Collins, Samantha G, PA-C, 25 mcg at 01/27/23 1701   ferric citrate (AURYXIA) tablet 420 mg, 420 mg, Oral, TID WC, Estanislado Emms, MD, 420 mg at 01/29/23 9604   folic acid (FOLVITE) tablet 1 mg, 1 mg, Oral, Daily, Skip Mayer A, MD, 1 mg at 01/28/23 5409   heparin injection 1,000 Units, 1,000 Units, Intracatheter,  PRN, Collins, Samantha G, PA-C   insulin aspart (novoLOG) injection 0-15 Units, 0-15 Units, Subcutaneous, TID WC, Zigmund Makai Dumond., MD   insulin aspart (novoLOG) injection 0-5 Units, 0-5 Units, Subcutaneous, QHS, Lowell Guitar, A Charlyn Minerva., MD   insulin aspart (novoLOG) injection 5 Units, 5 Units, Subcutaneous, TID WC, Zigmund Darcee Dekker., MD, 5 Units at 01/29/23 8119   insulin glargine-yfgn [facility]) injection 12 Units, 12 Units, Subcutaneous, BID, Zigmund Ignatius Kloos., MD, 12 Units at 01/29/23 1478   levothyroxine (SYNTHROID) tablet 75 mcg, 75 mcg, Oral, QAC breakfast, Lurline Del, MD, 75 mcg at 01/29/23 0650   lidocaine (PF) (XYLOCAINE) 1 % injection 5 mL, 5 mL, Intradermal, PRN, Thomasena Edis, Samantha G, PA-C   lidocaine-prilocaine (EMLA) cream 1 Application, 1 Application, Topical, PRN, Thomasena Edis, Samantha G, PA-C   LORazepam (ATIVAN) injection 0.5 mg, 0.5 mg, Intravenous, Q6H PRN, Julian Reil, Jared M, DO, 0.5 mg at 01/25/23 1600   melatonin tablet 3 mg, 3 mg, Oral, QHS, Cinderella, Margaret A, 3 mg at 01/28/23 2112   midodrine (PROAMATINE) tablet 5 mg, 5 mg, Oral, Q T,Th,Sa-HD, Collins, Samantha G, PA-C, 5 mg at 01/29/23 2956   mirtazapine (REMERON) tablet 7.5 mg, 7.5 mg, Oral, QHS, [name], MD, 7.5 mg at 01/28/23 2111   nystatin (MYCOSTATIN) 100000 UNIT/ML suspension 500,000 Units, 5 mL, Oral, QID, Jeani Hawking, MD, 500,000 Units at 01/28/23 2113   pantoprazole (PROTONIX) EC tablet 40 mg, 40 mg, Oral, Daily, Paytes, Austin A, RPH, 40 mg at 01/28/23 2130   pentafluoroprop-tetrafluoroeth (GEBAUERS) aerosol 1 Application, 1 Application, Topical, PRN, Oretha Milch, PA-C   promethazine (PHENERGAN) 12.5 mg in sodium chloride 0.9 % 50 mL IVPB, 12.5 mg, Intravenous, Q6H PRN, [name], MD, Last Rate: 200 mL/hr at 01/28/23 0837, 12.5 mg at 01/28/23 0837   sucralfate (CARAFATE) 1 GM/10ML suspension 1 g, 1 g, Oral, TID WC & HS, Skip Mayer A, MD, 1 g at 01/29/23 0722    venlafaxine XR (EFFEXOR-XR) 24 hr capsule 75 mg, 75 mg, Oral, Q breakfast, [name], MD, 75 mg at 01/28/23 0735  Allergies: Allergies  Allergen Reactions   Cephalexin Diarrhea and Other (See Comments)   Codeine Nausea And Vomiting and Other (See Comments)    Exam Findings  Vital signs:  Temp:  [97.5 F (36.4 C)-98.4 F (36.9 C)] 98.3 F (  36.8 C) (09/05 0758) Pulse Rate:  [71-86] 71 (09/05 1030) Resp:  [14-21] 18 (09/05 1030) BP: (96-190)/(32-166) 119/52 (09/05 1030) SpO2:  [97 %-100 %] 100 % (09/05 1030) Weight:  [78.4 kg-80.6 kg] 80.6 kg (09/05 0758)  Psychiatric Specialty Exam:  Presentation  General Appearance: Casual  Eye Contact:Good  Speech:Clear and Coherent; Normal Rate  Speech Volume:Normal  Handedness:Right   Mood and Affect  Mood:-- ("good")  Affect:Appropriate; Restricted; Congruent; Full Range   Thought Process  Thought Processes:Linear; Coherent  Descriptions of Associations:Intact  Orientation:Full (Time, Place and Person)  Thought Content:WDL  History of Schizophrenia/Schizoaffective disorder:No data recorded Duration of Psychotic Symptoms:No data recorded Hallucinations:Hallucinations: None  Ideas of Reference:None  Suicidal Thoughts:Suicidal Thoughts: No  Homicidal Thoughts:Homicidal Thoughts: No   Sensorium  Memory:Immediate Good; Recent Good  Judgment:Good  Insight:Fair   Executive Functions  Concentration:Good  Attention Span:Good  Recall:Good  Fund of Knowledge:Good  Language:Good   Psychomotor Activity  Psychomotor Activity:Psychomotor Activity: Psychomotor Retardation   Assets  Assets:Resilience   Sleep  Sleep:Sleep: Good   Physical Exam: Physical Exam Vitals and nursing note reviewed.  HENT:     Head: Normocephalic and atraumatic.  Pulmonary:     Effort: Pulmonary effort is normal.    Blood pressure (!) 119/52, pulse 71, temperature 98.3 F (36.8 C), resp. rate 18, weight 80.6 kg,  SpO2 100%. Body mass index is 29.57 kg/m.

## 2023-01-29 NOTE — Progress Notes (Signed)
OT Cancellation Note  Patient Details Name: Susan [name] MRN: [id number] DOB: [birth year]   Cancelled Treatment:    Reason Eval/Treat Not Completed: (P) Fatigue/lethargy limiting ability to participate, will reattempt tomorrow.  Alexis Goodell 01/29/2023, 3:51 PM

## 2023-01-29 NOTE — Progress Notes (Signed)
[name] Progress Note   Subjective:   Pt seen on HD. Reports nausea is better today. Denies SOB. Denies CP and dizziness.   Objective Vitals:   01/29/23 0016 01/29/23 0500 01/29/23 0616 01/29/23 0758  BP: 104/60  (!) 106/32 137/73  Pulse:   76 81  Resp:   19 14  Temp:   98.4 F (36.9 C) 98.3 F (36.8 C)  TempSrc:      SpO2:   100% 100%  Weight:  78.4 kg  80.6 kg   Physical Exam General: Chronically ill appearing female, alert and in NRA Heart: RRR, no murmurs, rubs or gallops Lungs: Diminished in bases but no rhonchi/rales auscultated, on O2 via Brooten, normal WOB Abdomen: Soft, non-distended, +BS Extremities: Trace edema L leg, R BKA Dialysis Access:  [facility]  Additional Objective Labs: Basic Metabolic Panel: Recent Labs  Lab 01/22/23 0850 01/23/23 1150 01/24/23 0559 01/26/23 0430 01/27/23 0554 01/28/23 0611  NA 133* 131*   < > 129* 130* 130*  K 4.1 3.6   < > 4.0 4.4 4.2  CL 98 96*   < > 94* 95* 94*  CO2 22 23   < > 25 23 25   GLUCOSE 248* 244*   < > 159* 271* 369*  BUN 45* 30*   < > 29* 51* 37*  CREATININE 4.42* 3.71*   < > 4.51* 5.85* 4.59*  CALCIUM 8.6* 8.4*   < > 9.1 8.9 8.6*  PHOS 4.6 4.6  --   --   --   --    < > = values in this interval not displayed.   Liver Function Tests: Recent Labs  Lab 01/22/23 0850 01/23/23 1150  ALBUMIN 2.5* 2.6*   No results for input(s): "LIPASE", "AMYLASE" in the last 168 hours. CBC: Recent Labs  Lab 01/24/23 0850 01/25/23 0506 01/27/23 0730  WBC 7.6 8.3 6.7  HGB 10.6* 10.4* 9.6*  HCT 35.8* 35.1* 32.4*  MCV 77.7* 75.8* 78.1*  PLT 271 206 198   Blood Culture    Component Value Date/Time   SDES TISSUE 11/14/2022 0919   SPECREQUEST NONE 11/14/2022 0919   CULT  11/14/2022 0919    FEW PROTEUS MIRABILIS RARE KLEBSIELLA PNEUMONIAE NO ANAEROBES ISOLATED FEW ENTEROCOCCUS FAECALIS    REPTSTATUS 11/19/2022 FINAL 11/14/2022 0919    Cardiac Enzymes: No results for input(s): "CKTOTAL", "CKMB",  "CKMBINDEX", "TROPONINI" in the last 168 hours. CBG: Recent Labs  Lab 01/28/23 0724 01/28/23 1110 01/28/23 1613 01/28/23 2037 01/29/23 0714  GLUCAP 387* 321* 349* 379* 435*   Iron Studies: No results for input(s): "IRON", "TIBC", "TRANSFERRIN", "FERRITIN" in the last 72 hours. @lablastinr3 @ Studies/Results: No results found. Medications:  anticoagulant sodium citrate     promethazine (PHENERGAN) injection (IM or IVPB) 12.5 mg (01/28/23 0837)    apixaban  5 mg Oral BID   atorvastatin  80 mg Oral Daily   calcitRIOL  0.75 mcg Oral Daily   carvedilol  6.25 mg Oral BID WC   Chlorhexidine Gluconate Cloth  6 each Topical Q0600   Chlorhexidine Gluconate Cloth  6 each Topical Q0600   clonazePAM  0.5 mg Oral BID   darbepoetin (ARANESP) injection - DIALYSIS  25 mcg Subcutaneous Q Tue-1800   ferric citrate  420 mg Oral TID WC   folic acid  1 mg Oral Daily   insulin aspart  0-15 Units Subcutaneous TID WC   insulin aspart  0-5 Units Subcutaneous QHS   insulin aspart  5 Units Subcutaneous TID WC  insulin glargine-yfgn  12 Units Subcutaneous BID   levothyroxine  75 mcg Oral QAC breakfast   melatonin  3 mg Oral QHS   midodrine  5 mg Oral Q T,Th,Sa-HD   mirtazapine  7.5 mg Oral QHS   nystatin  5 mL Oral QID   pantoprazole  40 mg Oral Daily   sucralfate  1 g Oral TID WC & HS   venlafaxine XR  75 mg Oral Q breakfast    Outpatient Dialysis Orders: TTS [facility]  4h  400/800   2/2 bath   [facility] TDC   Heparin 4000 + 2000 midrun  EDW was just lowered to 79 kg  - rocaltrol 0.75 mcg po three times per week - mircera 120 mcg  IV q 4 wks - last mircera given 8/10 (not given after discharge)  Just discharged 01/17/23   Assessment/Plan: # Intractable Nausea/vomiting  - per primary team/GI, CT with improving colitis - Possible gastroparesis, previously with considerable hyperglycemia.  Blood sugars are overall improved from that admission but still elevated -noted she is on carafate- please avoid  long term use in ESRD patients   # ESRD - HD per TTS schedule  - order is in for daily weights - AVF is not salvageable per prior vascular notes, using TDC   # HTN and volume   - Anasarca noted on prior admission, weight trending back up a bit  - optimize volume status with HD, however UF is limited by hypotension - note that she reports that she takes midodrine prior to outpatient HD and then was instructed to bring a dose to the clinic in case she needs another dose - we have given midodrine PRN here and have used albumin as needed. Will schedule midodrine pre-HD and order albumin PRN    # Hyponatremia  - currently 127 -optimizing volume with HD, see above   # Anemia of CKD  - Hgb 9.1. started aranesp on 01/27/23   # Metabolic bone disease  - on renvela - given concern for possible GI motility disorder will stop.  She would benefit from a new binder.   - Start auryxia with meals  - continue calcitriol 0.75 mcg three times a week     Rogers Blocker, PA-C 01/29/2023, 8:27 AM  [facility] Pager: [phone]

## 2023-01-30 ENCOUNTER — Ambulatory Visit (HOSPITAL_COMMUNITY): Payer: PPO

## 2023-01-30 ENCOUNTER — Other Ambulatory Visit: Payer: Self-pay | Admitting: Family Medicine

## 2023-01-30 DIAGNOSIS — N186 End stage renal disease: Secondary | ICD-10-CM | POA: Diagnosis not present

## 2023-01-30 LAB — CBC WITH DIFFERENTIAL/PLATELET
Abs Immature Granulocytes: 0.03 10*3/uL (ref 0.00–0.07)
Basophils Absolute: 0 10*3/uL (ref 0.0–0.1)
Basophils Relative: 1 %
Eosinophils Absolute: 0.1 10*3/uL (ref 0.0–0.5)
Eosinophils Relative: 2 %
HCT: 39.3 % (ref 36.0–46.0)
Hemoglobin: 11.7 g/dL — ABNORMAL LOW (ref 12.0–15.0)
Immature Granulocytes: 1 %
Lymphocytes Relative: 9 %
Lymphs Abs: 0.5 10*3/uL — ABNORMAL LOW (ref 0.7–4.0)
MCH: 23 pg — ABNORMAL LOW (ref 26.0–34.0)
MCHC: 29.8 g/dL — ABNORMAL LOW (ref 30.0–36.0)
MCV: 77.4 fL — ABNORMAL LOW (ref 80.0–100.0)
Monocytes Absolute: 0.4 10*3/uL (ref 0.1–1.0)
Monocytes Relative: 6 %
Neutro Abs: 5.1 10*3/uL (ref 1.7–7.7)
Neutrophils Relative %: 81 %
Platelets: 211 10*3/uL (ref 150–400)
RBC: 5.08 MIL/uL (ref 3.87–5.11)
RDW: 18.8 % — ABNORMAL HIGH (ref 11.5–15.5)
WBC: 6.1 10*3/uL (ref 4.0–10.5)
nRBC: 0 % (ref 0.0–0.2)

## 2023-01-30 LAB — COMPREHENSIVE METABOLIC PANEL
ALT: 13 U/L (ref 0–44)
AST: 18 U/L (ref 15–41)
Albumin: 3.1 g/dL — ABNORMAL LOW (ref 3.5–5.0)
Alkaline Phosphatase: 165 U/L — ABNORMAL HIGH (ref 38–126)
Anion gap: 11 (ref 5–15)
BUN: 35 mg/dL — ABNORMAL HIGH (ref 8–23)
CO2: 26 mmol/L (ref 22–32)
Calcium: 9.6 mg/dL (ref 8.9–10.3)
Chloride: 96 mmol/L — ABNORMAL LOW (ref 98–111)
Creatinine, Ser: 3.65 mg/dL — ABNORMAL HIGH (ref 0.44–1.00)
GFR, Estimated: 13 mL/min — ABNORMAL LOW (ref >60)
Glucose, Bld: 359 mg/dL — ABNORMAL HIGH (ref 70–99)
Potassium: 3.9 mmol/L (ref 3.5–5.1)
Sodium: 133 mmol/L — ABNORMAL LOW (ref 135–145)
Total Bilirubin: 0.7 mg/dL (ref 0.3–1.2)
Total Protein: 6.6 g/dL (ref 6.5–8.1)

## 2023-01-30 LAB — GLUCOSE, CAPILLARY
Glucose-Capillary: 354 mg/dL — ABNORMAL HIGH (ref 70–99)
Glucose-Capillary: 346 mg/dL — ABNORMAL HIGH (ref 70–99)
Glucose-Capillary: 89 mg/dL (ref 70–99)
Glucose-Capillary: 55 mg/dL — ABNORMAL LOW (ref 70–99)
Glucose-Capillary: 58 mg/dL — ABNORMAL LOW (ref 70–99)
Glucose-Capillary: 131 mg/dL — ABNORMAL HIGH (ref 70–99)

## 2023-01-30 LAB — PHOSPHORUS: Phosphorus: 2.2 mg/dL — ABNORMAL LOW (ref 2.5–4.6)

## 2023-01-30 LAB — MAGNESIUM: Magnesium: 1.6 mg/dL — ABNORMAL LOW (ref 1.7–2.4)

## 2023-01-30 MED ORDER — SORBITOL 70 % SOLN
300.0000 mL | TOPICAL_OIL | Freq: Once | ORAL | Status: DC
  Filled 2023-01-30: qty 90

## 2023-01-30 MED ORDER — INSULIN ASPART 100 UNIT/ML IJ SOLN
8.0000 [IU] | Freq: Three times a day (TID) | INTRAMUSCULAR | Status: DC
  Administered 2023-01-30 (×2): 8 [IU] via SUBCUTANEOUS

## 2023-01-30 MED ORDER — INSULIN GLARGINE-YFGN 100 UNIT/ML ~~LOC~~ SOLN
18.0000 [IU] | Freq: Two times a day (BID) | SUBCUTANEOUS | Status: DC
  Administered 2023-01-30 – 2023-02-01 (×6): 18 [IU] via SUBCUTANEOUS
  Filled 2023-01-30 (×8): qty 0.18

## 2023-01-30 MED ORDER — HYDROMORPHONE HCL 1 MG/ML IJ SOLN
0.5000 mg | Freq: Once | INTRAMUSCULAR | Status: AC
  Administered 2023-01-30: 0.5 mg via INTRAVENOUS
  Filled 2023-01-30: qty 1

## 2023-01-30 NOTE — Progress Notes (Signed)
[facility] Progress Note   Subjective:    Seen and examined patient at bed. Tolerated yesterday's HD with UF 2.5L. She is c/o ongoing N/V, says hard to keep stuff down. Next HD 9/7.  Objective Vitals:   01/29/23 2145 01/30/23 0514 01/30/23 0701 01/30/23 0911  BP: (!) 149/67 131/71  (!) 157/94  Pulse: 96 96  95  Resp: 18 18  16   Temp: 98.4 F (36.9 C) 97.8 F (36.6 C)  97.7 F (36.5 C)  TempSrc: Oral Oral    SpO2: 100% 100%  100%  Weight:   78.7 kg    Physical Exam General: Chronically ill appearing female, alert and in NRA Heart: RRR, no murmurs, rubs or gallops Lungs: Clear anteriorly, dimimshed laterally, on O2 via [place], normal WOB Abdomen: Soft, non-distended, +BS Extremities: Trace edema L leg, R BKA Dialysis Access:  [facility]  Filed Weights   01/29/23 0758 01/29/23 1207 01/30/23 0701  Weight: 80.6 kg 78.6 kg 78.7 kg    Intake/Output Summary (Last 24 hours) at 01/30/2023 1603 Last data filed at 01/30/2023 1330 Gross per 24 hour  Intake 360 ml  Output --  Net 360 ml    Additional Objective Labs: Basic Metabolic Panel: Recent Labs  Lab 01/28/23 0611 01/29/23 0820 01/30/23 0659  NA 130* 127*  126* 133*  K 4.2 4.0  4.0 3.9  CL 94* 94*  93* 96*  CO2 25 25  24 26   GLUCOSE 369* 490*  490* 359*  BUN 37* 60*  60* 35*  CREATININE 4.59* 5.82*  5.74* 3.65*  CALCIUM 8.6* 8.8*  8.8* 9.6  PHOS  --  2.5  2.5 2.2*   Liver Function Tests: Recent Labs  Lab 01/29/23 0820 01/30/23 0659  AST 14* 18  ALT 13 13  ALKPHOS 137* 165*  BILITOT 0.4 0.7  PROT 5.8* 6.6  ALBUMIN 2.9*  2.9* 3.1*   No results for input(s): "LIPASE", "AMYLASE" in the last 168 hours. CBC: Recent Labs  Lab 01/24/23 0850 01/25/23 0506 01/27/23 0730 01/29/23 0820 01/30/23 0659  WBC 7.6 8.3 6.7 4.5 6.1  NEUTROABS  --   --   --  3.2 5.1  HGB 10.6* 10.4* 9.6* 9.1* 11.7*  HCT 35.8* 35.1* 32.4* 30.8* 39.3  MCV 77.7* 75.8* 78.1* 77.4* 77.4*  PLT 271 206 198 166 211    Blood Culture    Component Value Date/Time   SDES TISSUE 11/14/2022 0919   SPECREQUEST NONE 11/14/2022 0919   CULT  11/14/2022 0919    FEW PROTEUS MIRABILIS RARE KLEBSIELLA PNEUMONIAE NO ANAEROBES ISOLATED FEW ENTEROCOCCUS FAECALIS    REPTSTATUS 11/19/2022 FINAL 11/14/2022 0919    Cardiac Enzymes: No results for input(s): "CKTOTAL", "CKMB", "CKMBINDEX", "TROPONINI" in the last 168 hours. CBG: Recent Labs  Lab 01/29/23 1240 01/29/23 1626 01/29/23 2148 01/30/23 0729 01/30/23 1118  GLUCAP 181* 204* 244* 354* 346*   Iron Studies: No results for input(s): "IRON", "TIBC", "TRANSFERRIN", "FERRITIN" in the last 72 hours. Lab Results  Component Value Date   INR 1.4 (H) 05/22/2022   INR 1.5 (H) 02/01/2022   INR 1.3 (H) 01/14/2022   Studies/Results: No results found.  Medications:   apixaban  5 mg Oral BID   atorvastatin  80 mg Oral Daily   calcitRIOL  0.75 mcg Oral Daily   carvedilol  6.25 mg Oral BID WC   Chlorhexidine Gluconate Cloth  6 each Topical Q0600   Chlorhexidine Gluconate Cloth  6 each Topical Q0600   clonazePAM  0.5 mg  Oral BID   darbepoetin (ARANESP) injection - DIALYSIS  25 mcg Subcutaneous Q Tue-1800   ferric citrate  420 mg Oral TID WC   folic acid  1 mg Oral Daily   Gerhardt's butt cream   Topical BID   insulin aspart  0-15 Units Subcutaneous TID WC   insulin aspart  0-5 Units Subcutaneous QHS   insulin aspart  8 Units Subcutaneous TID WC   insulin glargine-yfgn  18 Units Subcutaneous BID   levothyroxine  75 mcg Oral QAC breakfast   melatonin  3 mg Oral QHS   midodrine  5 mg Oral Q T,Th,Sa-HD   mirtazapine  7.5 mg Oral QHS   nystatin  5 mL Oral QID   pantoprazole  40 mg Oral Daily   polyethylene glycol  17 g Oral Daily   sorbitol, milk of mag, mineral oil, glycerin (SMOG) enema  300 mL Rectal Once   sucralfate  1 g Oral TID WC & HS   venlafaxine XR  75 mg Oral Q breakfast    Dialysis Orders: TTS [facility]  4h  400/800   2/2 bath   [facility] TDC    Heparin 4000 + 2000 midrun  EDW was just lowered to 79 kg  - rocaltrol 0.75 mcg po three times per week - mircera 120 mcg  IV q 4 wks - last mircera given 8/10 (not given after discharge)  Just discharged 01/17/23   Assessment/Plan: # Intractable Nausea/vomiting  - per primary team/GI, CT with improving colitis - Possible gastroparesis, previously with considerable hyperglycemia.  Blood sugars are overall improved from that admission but still elevated -noted she is on carafate- please avoid long term use in ESRD patients   # ESRD - HD per TTS schedule  - order is in for daily weights - AVF is not salvageable per prior vascular notes, using TDC   # HTN and volume   - Anasarca noted on prior admission, weight trending back up a bit  - optimize volume status with HD, however UF is limited by hypotension - note that she reports that she takes midodrine prior to outpatient HD and then was instructed to bring a dose to the clinic in case she needs another dose - we have given midodrine PRN here and have used albumin as needed. Will schedule midodrine pre-HD and order albumin PRN    # Hyponatremia  - currently 127 -optimizing volume with HD, see above   # Anemia of CKD  - Hgb 9.1. started aranesp on 01/27/23   # Metabolic bone disease  - on renvela - given concern for possible GI motility disorder will stop.  She would benefit from a new binder.   - Start auryxia with meals  - continue calcitriol 0.75 mcg three times a week  Salome Holmes, NP [employer] 01/30/2023,4:03 PM  LOS: 10 days

## 2023-01-30 NOTE — Telephone Encounter (Signed)
Prescription Request  01/30/2023  LOV: 12/26/2022  What is the name of the medication or equipment? venlafaxine XR (EFFEXOR-XR) 75 MG 24 hr capsul   Have you contacted your pharmacy to request a refill? Yes   Which pharmacy would you like this sent to?  [id number] [place], [place] - [address] [place] [address] Phone: [phone] Fax: [phone]    Patient notified that their request is being sent to the clinical staff for review and that they should receive a response within 2 business days.   Please advise at [facility] [phone]

## 2023-01-30 NOTE — Progress Notes (Signed)
Hypoglycemic Event  CBG: 55  Treatment: 4 oz juice  Symptoms: None  Follow-up CBG: Time:1852 CBG Result:58  Possible Reasons for Event: Inadequate meal intake  Comments/MD notified:Patient given juice and crackers with peanut butter. Patient given additional OJ after second check.    Susan [name]

## 2023-01-30 NOTE — Progress Notes (Signed)
PROGRESS NOTE    Susan [name]  BJY:[id number] DOB: Oct 07, 1950 DOA: 01/20/2023 PCP: Susan Brooks, MD  Chief Complaint  Patient presents with   Nausea   Emesis    Brief Narrative:   72-year-old chronically sick lady with history of ESRD on hemodialysis TTS schedule, peripheral vascular disease status post right BKA, recent left wound debridement and skin grafting by Dr. 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  In the emergency room afebrile.  WBC count normal.  Glucose 309.  Upper quadrant ultrasound with cholelithiasis without evidence of acute cholecystitis.  CT scan consistent with pulmonary vascular congestion, interstitial prominence, small bilateral pleural effusion.  EKG with right bundle branch block, sinus rhythm, QTc 540.  Due to persistent nausea vomiting, she was given Ativan, Zofran and promethazine.  Patient remained in the hospital due to persistent nausea. 9/3: Patient came back from hemodialysis.  Dramatically patient ate 100% of her lunch without using any nausea medications.  Assessment & Plan:   Principal Problem:   ESRD (end stage renal disease) (HCC) Active Problems:   MDD (major depressive disorder), recurrent episode, moderate (HCC)   Historical diagnosis of generalized anxiety disorder   Secondary insomnia   Intractable nausea and vomiting   Nausea   Depression  Intractable nausea and vomiting with recent episode of colitis: Persistent symptoms.  CT scan with evidence of improving colitis.   Unclear cause Patient with prolonged QTc, limitation on using antiemetics. Currently on PPI, ativan prn, occasional use of Phenergan. Followed by GI.  Recommended conservative management.  On nystatin swish and swallow. Allowing regular diet so  she can have more choices. Complaints of N/V/D overnight after dialysis (per RN, only nausea noted overnight in her report - will follow) CT scan, right upper quadrant ultrasound without any acute findings. Appreciate psychiatry assistance (remeron)   ESRD HD per renal  Chronic systolic congestive heart failure: volume per renal with HD Paroxysmal Alphus Zeck-fib, currently in sinus rhythm.  On Coreg and Eliquis. Coronary artery disease, status post stent.  Stable. Hypertension, coreg, has midodrine on TTS with dialysis Anemia of chronic disease, hemoglobin stable Hypothyroidism, on synthroid Depression  Anxiety continue venlafaxine, clonazepam - Remeron added by psychiatry.  Psych recommending remeron, melatonin, venlafaxine, clonazepam. They've now signed off.    Type 2 diabetes, hyperglycemia.  Basal, bolus, and SSI - adjust prn  hyperglycemic this AM   Peripheral vascular disease Status post right BKA.   Status post debridement and fish skin grafting left leg: Local wound care.  Dry dressing. Dopplerable PT pulse on left, needs outpatient vascular follow up ABI for LLE Eliquis, lipitor    Prolonged Qtc  RBBB: Limits our ability to use nausea medicines.  Chronic stable prolonged QTc.  Currently using prn phenergan. Will repeat EKG  -> 516 (with prolonged QRS, this likely overestimates) Repeat EKG 9/2, QTc 521.     DVT prophylaxis: eliquis Code Status: full Family Communication: none Disposition:   Status is: Inpatient Remains inpatient appropriate because: continued need for inpatient care   Consultants:  Psychiatry nephrology  Procedures:  none  Antimicrobials:  Anti-infectives (From admission, onward)    None       Subjective: C/o N/V/D overnight (nothing charted - will discuss with RN)  Objective: Vitals:   01/29/23 2145 01/30/23 0514 01/30/23 0701 01/30/23 0911  BP: Marland Kitchen)  149/67 131/71  (!) 157/94  Pulse: 96 96  95  Resp: 18 18  16   Temp: 98.4 F (36.9 C)  97.8 F (36.6 C)  97.7 F (36.5 C)  TempSrc: Oral Oral    SpO2: 100% 100%  100%  Weight:   78.7 kg     Intake/Output Summary (Last 24 hours) at 01/30/2023 0957 Last data filed at 01/30/2023 0900 Gross per 24 hour  Intake 120 ml  Output 2500 ml  Net -2380 ml   Filed Weights   01/29/23 0758 01/29/23 1207 01/30/23 0701  Weight: 80.6 kg 78.6 kg 78.7 kg    Examination:  General: No acute distress. Cardiovascular: RRR Lungs: unlabored Abdomen: mildly TTP, soft, nondistended Neurological: Alert and oriented 3. Moves all extremities 4 with equal strength. Cranial nerves II through XII grossly intact. Extremities: No clubbing or cyanosis. No edema.  Data Reviewed: I have personally reviewed following labs and imaging studies  CBC: Recent Labs  Lab 01/24/23 0850 01/25/23 0506 01/27/23 0730 01/29/23 0820 01/30/23 0659  WBC 7.6 8.3 6.7 4.5 6.1  NEUTROABS  --   --   --  3.2 5.1  HGB 10.6* 10.4* 9.6* 9.1* 11.7*  HCT 35.8* 35.1* 32.4* 30.8* 39.3  MCV 77.7* 75.8* 78.1* 77.4* 77.4*  PLT 271 206 198 166 211    Basic Metabolic Panel: Recent Labs  Lab 01/23/23 1150 01/24/23 0559 01/25/23 0506 01/26/23 0430 01/27/23 0554 01/28/23 0611 01/29/23 0820 01/30/23 0659  NA 131* 129*   < > 129* 130* 130* 127*  126* 133*  K 3.6 4.2   < > 4.0 4.4 4.2 4.0  4.0 3.9  CL 96* 91*   < > 94* 95* 94* 94*  93* 96*  CO2 23 21*   < > 25 23 25 25  24 26   GLUCOSE 244* 360*   < > 159* 271* 369* 490*  490* 359*  BUN 30* 36*   < > 29* 51* 37* 60*  60* 35*  CREATININE 3.71* 4.57*   < > 4.51* 5.85* 4.59* 5.82*  5.74* 3.65*  CALCIUM 8.4* 8.9   < > 9.1 8.9 8.6* 8.8*  8.8* 9.6  MG  --  1.9  --   --   --   --  1.7 1.6*  PHOS 4.6  --   --   --   --   --  2.5  2.5 2.2*   < > = values in this interval not displayed.    GFR: Estimated Creatinine Clearance: 14.2 mL/min (Jules Vidovich) (by C-G formula based on SCr of 3.65 mg/dL (H)).  Liver Function Tests: Recent Labs  Lab 01/23/23 1150 01/29/23 0820  01/30/23 0659  AST  --  14* 18  ALT  --  13 13  ALKPHOS  --  137* 165*  BILITOT  --  0.4 0.7  PROT  --  5.8* 6.6  ALBUMIN 2.6* 2.9*  2.9* 3.1*    CBG: Recent Labs  Lab 01/29/23 0714 01/29/23 1240 01/29/23 1626 01/29/23 2148 01/30/23 0729  GLUCAP 435* 181* 204* 244* 354*     Recent Results (from the past 240 hour(s))  MRSA Next Gen by PCR, Nasal     Status: None   Collection Time: 01/20/23  6:56 PM   Specimen: Nasal Mucosa; Nasal Swab  Result Value Ref Range Status   MRSA by PCR Next Gen NOT DETECTED NOT DETECTED Final    Comment: (NOTE) The GeneXpert MRSA Assay (FDA approved for NASAL specimens only), is one  component of Teletha Petrea comprehensive MRSA colonization surveillance program. It is not intended to diagnose MRSA infection nor to guide or monitor treatment for MRSA infections. Test performance is not FDA approved in patients less than 33 years old. Performed at [facility], [address], [place], [place] [address]          Radiology Studies: No results found.      Scheduled Meds:  apixaban  5 mg Oral BID   atorvastatin  80 mg Oral Daily   calcitRIOL  0.75 mcg Oral Daily   carvedilol  6.25 mg Oral BID WC   Chlorhexidine Gluconate Cloth  6 each Topical Q0600   Chlorhexidine Gluconate Cloth  6 each Topical Q0600   clonazePAM  0.5 mg Oral BID   darbepoetin (ARANESP) injection - DIALYSIS  25 mcg Subcutaneous Q Tue-1800   ferric citrate  420 mg Oral TID WC   folic acid  1 mg Oral Daily   Gerhardt's butt cream   Topical BID   insulin aspart  0-15 Units Subcutaneous TID WC   insulin aspart  0-5 Units Subcutaneous QHS   insulin aspart  8 Units Subcutaneous TID WC   insulin glargine-yfgn  18 Units Subcutaneous BID   levothyroxine  75 mcg Oral QAC breakfast   melatonin  3 mg Oral QHS   midodrine  5 mg Oral Q T,Th,Sa-HD   mirtazapine  7.5 mg Oral QHS   nystatin  5 mL Oral QID   pantoprazole  40 mg Oral Daily   polyethylene glycol  17 g Oral Daily    sorbitol, milk of mag, mineral oil, glycerin (SMOG) enema  300 mL Rectal Once   sucralfate  1 g Oral TID WC & HS   venlafaxine XR  75 mg Oral Q breakfast   Continuous Infusions:     LOS: 10 days    Time spent: over 30 min    Lacretia Nicks, MD [facility]   To contact the attending provider between 7A-7P or the covering provider during after hours 7P-7A, please log into the web site [email or web address] and access using universal [place] password for that web site. If you do not have the password, please call the hospital operator.  01/30/2023, 9:57 AM

## 2023-01-31 DIAGNOSIS — N186 End stage renal disease: Secondary | ICD-10-CM | POA: Diagnosis not present

## 2023-01-31 LAB — CBC WITH DIFFERENTIAL/PLATELET
Abs Immature Granulocytes: 0.03 10*3/uL (ref 0.00–0.07)
Basophils Absolute: 0.1 10*3/uL (ref 0.0–0.1)
Basophils Relative: 1 %
Eosinophils Absolute: 0.3 10*3/uL (ref 0.0–0.5)
Eosinophils Relative: 5 %
HCT: 40.5 % (ref 36.0–46.0)
Hemoglobin: 12.1 g/dL (ref 12.0–15.0)
Immature Granulocytes: 0 %
Lymphocytes Relative: 8 %
Lymphs Abs: 0.6 10*3/uL — ABNORMAL LOW (ref 0.7–4.0)
MCH: 23.3 pg — ABNORMAL LOW (ref 26.0–34.0)
MCHC: 29.9 g/dL — ABNORMAL LOW (ref 30.0–36.0)
MCV: 77.9 fL — ABNORMAL LOW (ref 80.0–100.0)
Monocytes Absolute: 0.4 10*3/uL (ref 0.1–1.0)
Monocytes Relative: 6 %
Neutro Abs: 5.4 10*3/uL (ref 1.7–7.7)
Neutrophils Relative %: 80 %
Platelets: 238 10*3/uL (ref 150–400)
RBC: 5.2 MIL/uL — ABNORMAL HIGH (ref 3.87–5.11)
RDW: 19.4 % — ABNORMAL HIGH (ref 11.5–15.5)
WBC: 6.8 10*3/uL (ref 4.0–10.5)
nRBC: 0 % (ref 0.0–0.2)

## 2023-01-31 LAB — RENAL FUNCTION PANEL
Albumin: 3.2 g/dL — ABNORMAL LOW (ref 3.5–5.0)
Anion gap: 12 (ref 5–15)
BUN: 45 mg/dL — ABNORMAL HIGH (ref 8–23)
CO2: 25 mmol/L (ref 22–32)
Calcium: 9.5 mg/dL (ref 8.9–10.3)
Chloride: 95 mmol/L — ABNORMAL LOW (ref 98–111)
Creatinine, Ser: 4.3 mg/dL — ABNORMAL HIGH (ref 0.44–1.00)
GFR, Estimated: 10 mL/min — ABNORMAL LOW (ref >60)
Glucose, Bld: 247 mg/dL — ABNORMAL HIGH (ref 70–99)
Phosphorus: 2 mg/dL — ABNORMAL LOW (ref 2.5–4.6)
Potassium: 3.7 mmol/L (ref 3.5–5.1)
Sodium: 132 mmol/L — ABNORMAL LOW (ref 135–145)

## 2023-01-31 LAB — GLUCOSE, CAPILLARY
Glucose-Capillary: 230 mg/dL — ABNORMAL HIGH (ref 70–99)
Glucose-Capillary: 234 mg/dL — ABNORMAL HIGH (ref 70–99)
Glucose-Capillary: 169 mg/dL — ABNORMAL HIGH (ref 70–99)
Glucose-Capillary: 225 mg/dL — ABNORMAL HIGH (ref 70–99)
Glucose-Capillary: 314 mg/dL — ABNORMAL HIGH (ref 70–99)

## 2023-01-31 MED ORDER — INSULIN ASPART 100 UNIT/ML IJ SOLN
0.0000 [IU] | Freq: Three times a day (TID) | INTRAMUSCULAR | Status: DC
  Administered 2023-01-31: 3 [IU] via SUBCUTANEOUS
  Administered 2023-02-01: 1 [IU] via SUBCUTANEOUS
  Administered 2023-02-01: 2 [IU] via SUBCUTANEOUS
  Administered 2023-02-01: 9 [IU] via SUBCUTANEOUS
  Administered 2023-02-02 (×2): 2 [IU] via SUBCUTANEOUS

## 2023-01-31 MED ORDER — INSULIN ASPART 100 UNIT/ML IJ SOLN
3.0000 [IU] | Freq: Three times a day (TID) | INTRAMUSCULAR | Status: DC
  Administered 2023-01-31 – 2023-02-02 (×6): 3 [IU] via SUBCUTANEOUS

## 2023-01-31 MED ORDER — HEPARIN SODIUM (PORCINE) 1000 UNIT/ML IJ SOLN
3200.0000 [IU] | Freq: Once | INTRAMUSCULAR | Status: AC
  Administered 2023-01-31: 3200 [IU]

## 2023-01-31 MED ORDER — INSULIN ASPART 100 UNIT/ML IJ SOLN
0.0000 [IU] | Freq: Every day | INTRAMUSCULAR | Status: DC
  Administered 2023-01-31: 4 [IU] via SUBCUTANEOUS
  Administered 2023-02-01: 5 [IU] via SUBCUTANEOUS

## 2023-01-31 MED ORDER — PROCHLORPERAZINE EDISYLATE 10 MG/2ML IJ SOLN
5.0000 mg | Freq: Four times a day (QID) | INTRAMUSCULAR | Status: DC | PRN
  Administered 2023-01-31 – 2023-02-01 (×3): 5 mg via INTRAVENOUS
  Filled 2023-01-31 (×3): qty 2

## 2023-01-31 NOTE — Progress Notes (Signed)
[facility] Progress Note   Subjective:    Seen and examined patient on HD. She reports feeling unwell overnight 2nd ongoing N/V. On PRN Phenergan. Tolerating UFG 2L. BP is 129/89.  Objective Vitals:   01/31/23 0815 01/31/23 0830 01/31/23 0900 01/31/23 1000  BP: (!) 155/69 133/74 (!) 143/76 (!) 142/42  Pulse: 93 92 91 89  Resp: 11 12 (!) 9 20  Temp:      TempSrc:      SpO2: 100% 100% 100% 100%  Weight:       Physical Exam General: Chronically ill appearing female, NAD Heart: RRR, no murmurs, rubs or gallops Lungs: Clear anteriorly, dimimshed laterally, on  3L O2 via Nunez, normal WOB Abdomen: Soft, non-distended, +BS Extremities: Trace edema L leg, R BKA Dialysis Access:  [facility]  Filed Weights   01/30/23 0701 01/31/23 0705 01/31/23 0803  Weight: 78.7 kg 78.4 kg 77.9 kg    Intake/Output Summary (Last 24 hours) at 01/31/2023 1016 Last data filed at 01/31/2023 0900 Gross per 24 hour  Intake 240 ml  Output 0 ml  Net 240 ml    Additional Objective Labs: Basic Metabolic Panel: Recent Labs  Lab 01/29/23 0820 01/30/23 0659 01/31/23 0711  NA 127*  126* 133* 132*  K 4.0  4.0 3.9 3.7  CL 94*  93* 96* 95*  CO2 25  24 26 25   GLUCOSE 490*  490* 359* 247*  BUN 60*  60* 35* 45*  CREATININE 5.82*  5.74* 3.65* 4.30*  CALCIUM 8.8*  8.8* 9.6 9.5  PHOS 2.5  2.5 2.2* 2.0*   Liver Function Tests: Recent Labs  Lab 01/29/23 0820 01/30/23 0659 01/31/23 0711  AST 14* 18  --   ALT 13 13  --   ALKPHOS 137* 165*  --   BILITOT 0.4 0.7  --   PROT 5.8* 6.6  --   ALBUMIN 2.9*  2.9* 3.1* 3.2*   No results for input(s): "LIPASE", "AMYLASE" in the last 168 hours. CBC: Recent Labs  Lab 01/25/23 0506 01/27/23 0730 01/29/23 0820 01/30/23 0659 01/31/23 0711  WBC 8.3 6.7 4.5 6.1 6.8  NEUTROABS  --   --  3.2 5.1 5.4  HGB 10.4* 9.6* 9.1* 11.7* 12.1  HCT 35.1* 32.4* 30.8* 39.3 40.5  MCV 75.8* 78.1* 77.4* 77.4* 77.9*  PLT 206 198 166 211 238   Blood Culture     Component Value Date/Time   SDES TISSUE 11/14/2022 0919   SPECREQUEST NONE 11/14/2022 0919   CULT  11/14/2022 0919    FEW PROTEUS MIRABILIS RARE KLEBSIELLA PNEUMONIAE NO ANAEROBES ISOLATED FEW ENTEROCOCCUS FAECALIS    REPTSTATUS 11/19/2022 FINAL 11/14/2022 0919    Cardiac Enzymes: No results for input(s): "CKTOTAL", "CKMB", "CKMBINDEX", "TROPONINI" in the last 168 hours. CBG: Recent Labs  Lab 01/30/23 1825 01/30/23 1849 01/30/23 1956 01/31/23 0519 01/31/23 0703  GLUCAP 55* 58* 131* 230* 234*   Iron Studies: No results for input(s): "IRON", "TIBC", "TRANSFERRIN", "FERRITIN" in the last 72 hours. Lab Results  Component Value Date   INR 1.4 (H) 05/22/2022   INR 1.5 (H) 02/01/2022   INR 1.3 (H) 01/14/2022   Studies/Results: No results found.  Medications:   apixaban  5 mg Oral BID   atorvastatin  80 mg Oral Daily   calcitRIOL  0.75 mcg Oral Daily   carvedilol  6.25 mg Oral BID WC   Chlorhexidine Gluconate Cloth  6 each Topical Q0600   Chlorhexidine Gluconate Cloth  6 each Topical Q0600   clonazePAM  0.5 mg Oral BID   darbepoetin (ARANESP) injection - DIALYSIS  25 mcg Subcutaneous Q Tue-1800   ferric citrate  420 mg Oral TID WC   folic acid  1 mg Oral Daily   Gerhardt's butt cream   Topical BID   insulin aspart  0-5 Units Subcutaneous QHS   insulin aspart  0-9 Units Subcutaneous TID WC   insulin aspart  3 Units Subcutaneous TID WC   insulin glargine-yfgn  18 Units Subcutaneous BID   levothyroxine  75 mcg Oral QAC breakfast   melatonin  3 mg Oral QHS   midodrine  5 mg Oral Q T,Th,Sa-HD   mirtazapine  7.5 mg Oral QHS   nystatin  5 mL Oral QID   pantoprazole  40 mg Oral Daily   polyethylene glycol  17 g Oral Daily   sorbitol, milk of mag, mineral oil, glycerin (SMOG) enema  300 mL Rectal Once   sucralfate  1 g Oral TID WC & HS   venlafaxine XR  75 mg Oral Q breakfast    Dialysis Orders: TTS [facility]  4h  400/800   2/2 bath   [facility] TDC   Heparin 4000 + 2000  midrun  EDW was just lowered to 79 kg  - rocaltrol 0.75 mcg po three times per week - mircera 120 mcg  IV q 4 wks - last mircera given 8/10 (not given after discharge)  Just discharged 01/17/23  Assessment/Plan: # Intractable Nausea/vomiting  - per primary team/GI, CT with improving colitis. Per GI, recommend conservative management - Possible gastroparesis, previously with considerable hyperglycemia.  Blood sugars are overall improved from that admission but still elevated -noted she is on carafate- please avoid long term use in ESRD patients -with prolong QTC which limits some antiemetics, on PRN Phenergan,    # ESRD - HD per TTS schedule  - order is in for daily weights - AVF is not salvageable per prior vascular notes, using TDC   # HTN and volume   - Anasarca noted on prior admission, weight trending back up a bit  - optimize volume status with HD, however UF is limited by hypotension - note that she reports that she takes midodrine prior to outpatient HD and then was instructed to bring a dose to the clinic in case she needs another dose - we have given midodrine PRN here and have used albumin as needed. Will schedule midodrine pre-HD and order albumin PRN    # Hyponatremia  - currently 132 -optimizing volume with HD, see above   # Anemia of CKD  - Last ESA given on 01/27/23. Hgb now at goal   # Metabolic bone disease  - Renvela stopped given concern for possible GI motility disorder. She would benefit from a new binder so Auryxia started with meals.  - continue calcitriol 0.75 mcg three times a week  Salome Holmes, NP [facility] 01/31/2023,10:16 AM  LOS: 11 days

## 2023-01-31 NOTE — Plan of Care (Signed)
  Problem: Cardiac: Goal: Ability to maintain an adequate cardiac output will improve Outcome: Progressing   Problem: Health Behavior/Discharge Planning: Goal: Ability to identify and utilize available resources and services will improve Outcome: Progressing Goal: Ability to manage health-related needs will improve Outcome: Progressing   Problem: Fluid Volume: Goal: Ability to achieve a balanced intake and output will improve Outcome: Progressing   Problem: Metabolic: Goal: Ability to maintain appropriate glucose levels will improve Outcome: Progressing   Problem: Nutritional: Goal: Maintenance of adequate nutrition will improve Outcome: Progressing Goal: Maintenance of adequate weight for body size and type will improve Outcome: Progressing   Problem: Respiratory: Goal: Will regain and/or maintain adequate ventilation Outcome: Progressing   Problem: Urinary Elimination: Goal: Ability to achieve and maintain adequate renal perfusion and functioning will improve Outcome: Progressing   Problem: Health Behavior/Discharge Planning: Goal: Ability to manage health-related needs will improve Outcome: Progressing   Problem: Clinical Measurements: Goal: Ability to maintain clinical measurements within normal limits will improve Outcome: Progressing Goal: Cardiovascular complication will be avoided Outcome: Progressing   Problem: Activity: Goal: Risk for activity intolerance will decrease Outcome: Progressing   Problem: Nutrition: Goal: Adequate nutrition will be maintained Outcome: Progressing   Problem: Coping: Goal: Level of anxiety will decrease Outcome: Progressing   Problem: Elimination: Goal: Will not experience complications related to bowel motility Outcome: Progressing Goal: Will not experience complications related to urinary retention Outcome: Progressing   Problem: Pain Managment: Goal: General experience of comfort will improve Outcome: Progressing    Problem: Safety: Goal: Ability to remain free from injury will improve Outcome: Progressing   Problem: Skin Integrity: Goal: Risk for impaired skin integrity will decrease Outcome: Progressing   Problem: Education: Goal: Ability to describe self-care measures that may prevent or decrease complications (Diabetes Survival Skills Education) will improve Outcome: Progressing Goal: Individualized Educational Video(s) Outcome: Progressing   Problem: Coping: Goal: Ability to adjust to condition or change in health will improve Outcome: Progressing   Problem: Fluid Volume: Goal: Ability to maintain a balanced intake and output will improve Outcome: Progressing   Problem: Health Behavior/Discharge Planning: Goal: Ability to identify and utilize available resources and services will improve Outcome: Progressing Goal: Ability to manage health-related needs will improve Outcome: Progressing   Problem: Metabolic: Goal: Ability to maintain appropriate glucose levels will improve Outcome: Progressing   Problem: Nutritional: Goal: Maintenance of adequate nutrition will improve Outcome: Progressing Goal: Progress toward achieving an optimal weight will improve Outcome: Progressing   Problem: Skin Integrity: Goal: Risk for impaired skin integrity will decrease Outcome: Progressing   Problem: Tissue Perfusion: Goal: Adequacy of tissue perfusion will improve Outcome: Progressing   Problem: Education: Goal: Individualized Educational Video(s) Outcome: Progressing   Problem: Fluid Volume: Goal: Compliance with measures to maintain balanced fluid volume will improve Outcome: Progressing   Problem: Health Behavior/Discharge Planning: Goal: Ability to manage health-related needs will improve Outcome: Progressing   Problem: Nutritional: Goal: Ability to make healthy dietary choices will improve Outcome: Progressing   Problem: Clinical Measurements: Goal: Complications related  to the disease process, condition or treatment will be avoided or minimized Outcome: Progressing

## 2023-01-31 NOTE — Progress Notes (Signed)
OT Cancellation Note  Patient Details Name: Susan [name] MRN: [id number] DOB: 15-Feb-1951   Cancelled Treatment:    Reason Eval/Treat Not Completed: Patient at procedure or test/ unavailable (Pt at HD, OT will follow-up with patient as schedule permits)  01/31/2023  AB, OTR/L  Acute Rehabilitation Services  Office: [phone]   Tristan Schroeder 01/31/2023, 8:38 AM

## 2023-01-31 NOTE — Progress Notes (Signed)
OT Cancellation Note  Patient Details Name: Susan [name] MRN: [id number] DOB: 1950/11/12   Cancelled Treatment:    Reason Eval/Treat Not Completed: Fatigue/lethargy limiting ability to participate (Attempted to see pt in the afternoon, at HD earlier. Pt fast asleep and awoken 3x during interaction but went back to sleep each time. OT to follow-up with patient as able)  01/31/2023  AB, OTR/L  Acute Rehabilitation Services  Office: [phone]   Tristan Schroeder 01/31/2023, 4:58 PM

## 2023-01-31 NOTE — Progress Notes (Addendum)
Received patient in bed to unit.  Alert and oriented.  Informed consent signed and in chart.   TX duration:3.5  Patient tolerated well.   Checked patient to see if she had urinated on herself:  she did not. Transported back to the room  Alert, without acute distress.  Hand-off given to patient's nurse.   Access used: R HD Cath Access issues: none  Total UF removed: Medication(s) given: Ativan, see MAR   01/31/23 1150  Vitals  Temp 98.2 F (36.8 C)  Temp Source Oral  BP (!) 176/164  MAP (mmHg) 170  Pulse Rate 98  ECG Heart Rate 95  Resp 17  Oxygen Therapy  SpO2 93 %  O2 Device Nasal Cannula  O2 Flow Rate (L/min) 3 L/min  During Treatment Monitoring  Dialysate Calcium Concentration 2.5  Duration of HD Treatment -hour(s) 3.49 hour(s)  HD Safety Checks Performed Yes  Intra-Hemodialysis Comments Tx completed;Tolerated well  Dialysis Fluid Bolus Normal Saline  Bolus Amount (mL) 300 mL     Stacie Glaze LPN Kid[facility] D[facility]

## 2023-01-31 NOTE — Plan of Care (Signed)
  Problem: Health Behavior/Discharge Planning: Goal: Ability to manage health-related needs will improve Outcome: Progressing   

## 2023-01-31 NOTE — Progress Notes (Signed)
PROGRESS NOTE    Susan [name]  MVH:[id number] DOB: January 06, 1951 DOA: 01/20/2023 PCP: Donita Brooks, MD  Chief Complaint  Patient presents with   Nausea   Emesis    Brief Narrative:   72-year-old chronically sick lady with history of ESRD on hemodialysis TTS schedule, peripheral vascular disease status post right BKA, recent left wound debridement and skin grafting by Dr. 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  In the emergency room afebrile.  WBC count normal.  Glucose 309.  Upper quadrant ultrasound with cholelithiasis without evidence of acute cholecystitis.  CT scan consistent with pulmonary vascular congestion, interstitial prominence, small bilateral pleural effusion.  EKG with right bundle branch block, sinus rhythm, QTc 540.  Due to persistent nausea vomiting, she was given Ativan, Zofran and promethazine.  Patient remained in the hospital due to persistent nausea. 9/3: Patient came back from hemodialysis.  Dramatically patient ate 100% of her lunch without using any nausea medications.  Assessment & Plan:   Principal Problem:   ESRD (end stage renal disease) (HCC) Active Problems:   MDD (major depressive disorder), recurrent episode, moderate (HCC)   Historical diagnosis of generalized anxiety disorder   Secondary insomnia   Intractable nausea and vomiting   Nausea   Depression  Intractable nausea and vomiting with recent episode of colitis: Persistent symptoms.  CT scan with evidence of improving colitis.   Unclear cause Patient with prolonged QTc, limitation on using antiemetics. Currently on PPI, ativan prn Followed by GI.  Recommended conservative management.  On nystatin swish and swallow. Allowing regular diet so she can have more choices.  Complaints of N/V/D again today CT scan, right upper quadrant ultrasound without any acute findings. Appreciate psychiatry assistance (remeron)   ESRD HD per renal  Chronic systolic congestive heart failure: volume per renal with HD Paroxysmal Maili Shutters-fib, currently in sinus rhythm.  On Coreg and Eliquis. Coronary artery disease, status post stent.  Stable. Hypertension, coreg, has midodrine on TTS with dialysis Anemia of chronic disease, hemoglobin stable Hypothyroidism, on synthroid Depression  Anxiety continue venlafaxine, clonazepam - Remeron added by psychiatry.  Psych recommending remeron, melatonin, venlafaxine, clonazepam. They've now signed off.    Type 2 diabetes, hyperglycemia.  Basal, bolus, and SSI - adjust prn  Hypoglycemia yesterday afternoon (reduced SSI and mealtime) Remains hyperglycemic this AM   Peripheral vascular disease Status post right BKA.   Status post debridement and fish skin grafting left leg: Local wound care.  Dry dressing. Dopplerable PT pulse on left, needs outpatient vascular follow up ABI for LLE Eliquis, lipitor    Prolonged Qtc  RBBB: Limits our ability to use nausea medicines.  Chronic stable prolonged QTc.  Currently using prn phenergan. Will repeat EKG  -> 519 (with prolonged QRS, this likely overestimates)     DVT prophylaxis: eliquis Code Status: full Family Communication: none Disposition:   Status is: Inpatient Remains inpatient appropriate because: continued need for inpatient care   Consultants:  Psychiatry nephrology  Procedures:  none  Antimicrobials:  Anti-infectives (From admission, onward)    None       Subjective: Continues to complain of N/V/D  Objective: Vitals:   01/31/23 0803 01/31/23 0815 01/31/23 0830 01/31/23 0900  BP: (!) 145/82 (!) 155/69 133/74 (!) 143/76  Pulse: 94 93 92 91  Resp: 14 11 12  (!)  9  Temp: 98.2 F (36.8 C)     TempSrc:      SpO2: 100% 100% 100% 100%  Weight: 77.9 kg        Intake/Output Summary (Last 24 hours) at 01/31/2023 0946 Last data filed at 01/31/2023 0900 Gross per 24 hour  Intake 240 ml  Output 0 ml  Net 240 ml   Filed Weights   01/30/23 0701 01/31/23 0705 01/31/23 0803  Weight: 78.7 kg 78.4 kg 77.9 kg    Examination:  General: No acute distress. Cardiovascular: RRR Lungs: unlabored Abdomen: Soft, nontender, nondistended  Neurological: Alert and oriented 3. Moves all extremities 4 with equal strength. Cranial nerves II through XII grossly intact. Extremities: RLE BKA  Data Reviewed: I have personally reviewed following labs and imaging studies  CBC: Recent Labs  Lab 01/25/23 0506 01/27/23 0730 01/29/23 0820 01/30/23 0659 01/31/23 0711  WBC 8.3 6.7 4.5 6.1 6.8  NEUTROABS  --   --  3.2 5.1 5.4  HGB 10.4* 9.6* 9.1* 11.7* 12.1  HCT 35.1* 32.4* 30.8* 39.3 40.5  MCV 75.8* 78.1* 77.4* 77.4* 77.9*  PLT 206 198 166 211 238    Basic Metabolic Panel: Recent Labs  Lab 01/27/23 0554 01/28/23 0611 01/29/23 0820 01/30/23 0659 01/31/23 0711  NA 130* 130* 127*  126* 133* 132*  K 4.4 4.2 4.0  4.0 3.9 3.7  CL 95* 94* 94*  93* 96* 95*  CO2 23 25 25  24 26 25   GLUCOSE 271* 369* 490*  490* 359* 247*  BUN 51* 37* 60*  60* 35* 45*  CREATININE 5.85* 4.59* 5.82*  5.74* 3.65* 4.30*  CALCIUM 8.9 8.6* 8.8*  8.8* 9.6 9.5  MG  --   --  1.7 1.6*  --   PHOS  --   --  2.5  2.5 2.2* 2.0*    GFR: Estimated Creatinine Clearance: 12 mL/min (Susan [name]) (by C-G formula based on SCr of 4.3 mg/dL (H)).  Liver Function Tests: Recent Labs  Lab 01/29/23 0820 01/30/23 0659 01/31/23 0711  AST 14* 18  --   ALT 13 13  --   ALKPHOS 137* 165*  --   BILITOT 0.4 0.7  --   PROT 5.8* 6.6  --   ALBUMIN 2.9*  2.9* 3.1* 3.2*    CBG: Recent Labs  Lab 01/30/23 1825 01/30/23 1849 01/30/23 1956 01/31/23 0519 01/31/23 0703  GLUCAP 55* 58* 131* 230* 234*     No results found for this or any previous visit (from the past 240 hour(s)).         Radiology Studies: No results found.      Scheduled Meds:  apixaban  5 mg Oral BID   atorvastatin  80 mg Oral Daily   calcitRIOL  0.75 mcg Oral Daily   carvedilol  6.25 mg Oral BID WC   Chlorhexidine Gluconate Cloth  6 each Topical Q0600   Chlorhexidine Gluconate Cloth  6 each Topical Q0600   clonazePAM  0.5 mg Oral BID   darbepoetin (ARANESP) injection - DIALYSIS  25 mcg Subcutaneous Q Tue-1800   ferric citrate  420 mg Oral TID WC   folic acid  1 mg Oral Daily   Gerhardt's butt cream   Topical BID   insulin aspart  0-15 Units Subcutaneous TID WC   insulin aspart  0-5 Units Subcutaneous QHS   insulin aspart  8 Units Subcutaneous TID WC   insulin glargine-yfgn  18 Units Subcutaneous BID   levothyroxine  75 mcg Oral  QAC breakfast   melatonin  3 mg Oral QHS   midodrine  5 mg Oral Q T,Th,Sa-HD   mirtazapine  7.5 mg Oral QHS   nystatin  5 mL Oral QID   pantoprazole  40 mg Oral Daily   polyethylene glycol  17 g Oral Daily   sorbitol, milk of mag, mineral oil, glycerin (SMOG) enema  300 mL Rectal Once   sucralfate  1 g Oral TID WC & HS   venlafaxine XR  75 mg Oral Q breakfast   Continuous Infusions:     LOS: 11 days    Time spent: over 30 min    Lacretia Nicks, MD [facility]   To contact the attending provider between 7A-7P or the covering provider during after hours 7P-7A, please log into the web site [email or web address] and access using universal [place] password for that web site. If you do not have the password, please call the hospital operator.  01/31/2023, 9:46 AM

## 2023-01-31 NOTE — Progress Notes (Signed)
Pt change for incont of a large BM and full bed change and gown change.

## 2023-02-01 ENCOUNTER — Encounter (HOSPITAL_COMMUNITY): Payer: PPO

## 2023-02-01 DIAGNOSIS — N186 End stage renal disease: Secondary | ICD-10-CM | POA: Diagnosis not present

## 2023-02-01 LAB — CBC WITH DIFFERENTIAL/PLATELET
Abs Immature Granulocytes: 0.02 10*3/uL (ref 0.00–0.07)
Basophils Absolute: 0.1 10*3/uL (ref 0.0–0.1)
Basophils Relative: 2 %
Eosinophils Absolute: 0.4 10*3/uL (ref 0.0–0.5)
Eosinophils Relative: 5 %
HCT: 38.5 % (ref 36.0–46.0)
Hemoglobin: 11.4 g/dL — ABNORMAL LOW (ref 12.0–15.0)
Immature Granulocytes: 0 %
Lymphocytes Relative: 15 %
Lymphs Abs: 1.1 10*3/uL (ref 0.7–4.0)
MCH: 23.4 pg — ABNORMAL LOW (ref 26.0–34.0)
MCHC: 29.6 g/dL — ABNORMAL LOW (ref 30.0–36.0)
MCV: 79.1 fL — ABNORMAL LOW (ref 80.0–100.0)
Monocytes Absolute: 1.1 10*3/uL — ABNORMAL HIGH (ref 0.1–1.0)
Monocytes Relative: 15 %
Neutro Abs: 4.8 10*3/uL (ref 1.7–7.7)
Neutrophils Relative %: 63 %
Platelets: 270 10*3/uL (ref 150–400)
RBC: 4.87 MIL/uL (ref 3.87–5.11)
RDW: 20.1 % — ABNORMAL HIGH (ref 11.5–15.5)
WBC: 7.5 10*3/uL (ref 4.0–10.5)
nRBC: 0 % (ref 0.0–0.2)

## 2023-02-01 LAB — COMPREHENSIVE METABOLIC PANEL
ALT: 14 U/L (ref 0–44)
AST: 18 U/L (ref 15–41)
Albumin: 3 g/dL — ABNORMAL LOW (ref 3.5–5.0)
Alkaline Phosphatase: 148 U/L — ABNORMAL HIGH (ref 38–126)
Anion gap: 12 (ref 5–15)
BUN: 28 mg/dL — ABNORMAL HIGH (ref 8–23)
CO2: 25 mmol/L (ref 22–32)
Calcium: 9.3 mg/dL (ref 8.9–10.3)
Chloride: 97 mmol/L — ABNORMAL LOW (ref 98–111)
Creatinine, Ser: 3.71 mg/dL — ABNORMAL HIGH (ref 0.44–1.00)
GFR, Estimated: 12 mL/min — ABNORMAL LOW (ref >60)
Glucose, Bld: 160 mg/dL — ABNORMAL HIGH (ref 70–99)
Potassium: 3.5 mmol/L (ref 3.5–5.1)
Sodium: 134 mmol/L — ABNORMAL LOW (ref 135–145)
Total Bilirubin: 0.3 mg/dL (ref 0.3–1.2)
Total Protein: 6.3 g/dL — ABNORMAL LOW (ref 6.5–8.1)

## 2023-02-01 LAB — GLUCOSE, CAPILLARY
Glucose-Capillary: 148 mg/dL — ABNORMAL HIGH (ref 70–99)
Glucose-Capillary: 172 mg/dL — ABNORMAL HIGH (ref 70–99)
Glucose-Capillary: 354 mg/dL — ABNORMAL HIGH (ref 70–99)
Glucose-Capillary: 414 mg/dL — ABNORMAL HIGH (ref 70–99)

## 2023-02-01 LAB — MAGNESIUM: Magnesium: 1.7 mg/dL (ref 1.7–2.4)

## 2023-02-01 LAB — PHOSPHORUS: Phosphorus: 2.5 mg/dL (ref 2.5–4.6)

## 2023-02-01 MED ORDER — INSULIN ASPART 100 UNIT/ML IJ SOLN
6.0000 [IU] | Freq: Once | INTRAMUSCULAR | Status: AC
  Administered 2023-02-01: 6 [IU] via SUBCUTANEOUS

## 2023-02-01 NOTE — Progress Notes (Signed)
Occupational Therapy Treatment Patient Details Name: Susan [name] MRN: [id number] DOB: 08/18/1950 Today's Date: 02/01/2023   History of present illness Susan [name] is a 72 y.o. female who now returns to ED with recurrent n/v and inability to take anything po. Possible gastroparesis or remaining symptoms from previous episode. May have a component of anxiety/depression. PHMx: sCHF EF 30-35%, ESRD on HD TThS, PVD s/p BKA, wheelchair limited mostly, hypothyroidism, morbid obesity, CAD DM and Afib on Eliquis who has interim history of admission  8/22-8/24  at which time she presented with  N/V/D and was found to have proctocolitis and well as early DKA. Patient was started on insulin drip and iv antibiotics. Patient stabilized and was discharged on cephalexin to complete course.   OT comments  Pt. Seen for skilled OT treatment.  Able to perform bed mobility to eob with light CGA.  Back to bed with S and some A to pull up in bed.  Tolerated eob for un supported grooming task with no lob noted.  Reviewed benefits of eob/oob for increasing activity tolerance and strength in conjunction with assisting with all adls that she is able to.  Cont. With current acute OT poc, progressing adls next session as pt. Able.       If plan is discharge home, recommend the following:  A lot of help with walking and/or transfers;A lot of help with bathing/dressing/bathroom;Assistance with cooking/housework;Help with stairs or ramp for entrance;Assist for transportation   Equipment Recommendations  None recommended by OT    Recommendations for Other Services      Precautions / Restrictions Precautions Precautions: Fall Precaution Comments: R BKA, LLE wound Restrictions LLE Weight Bearing: Non weight bearing Other Position/Activity Restrictions: per pt she reports Dr. Lajoyce Corners does not want her to put any weight on her LLE due to skin graft       Mobility Bed Mobility Overal bed mobility: Needs Assistance Bed  Mobility: Rolling, Sidelying to Sit, Sit to Supine Rolling: Used rails, Supervision Sidelying to sit: Contact guard assist, Used rails, HOB elevated   Sit to supine: Supervision   General bed mobility comments: cues for hand placement and use of rails, pt. with notable difficulty using r hand seconary to pain from iv.  light back support for final "push" upright but no other physical assistance required.  able to scoot hips/bles towards eob without assistance.  back to bed able to lay back and bring bles in without asssitance. did require use of boost function and pads to pull pt. up in bed    Transfers                         Balance                                           ADL either performed or assessed with clinical judgement   ADL Overall ADL's : Needs assistance/impaired Eating/Feeding: Minimal assistance Eating/Feeding Details (indicate cue type and reason): supported sitting/in bed hob elevated-required assistance opening packages secondary to sig. R hand pain associated with iv placement/bruising Grooming: Oral care;Set up;Sitting Grooming Details (indicate cue type and reason): un supported sitting eob, some light post. lean/swaying when using B hands at the same time but able to self correct and maintain un supported sitting  General ADL Comments: able to tolerate un supported sitting eob for seated grooming task.  reviewed benefits of sitting eob when able and to engage and assist with all adls that she is able to promote and increase strength and activity tolerance. pt. was receptive and agreed to the recommendations    Extremity/Trunk Assessment              Vision       Perception     Praxis      Cognition Arousal: Alert Behavior During Therapy: [facility] for tasks assessed/performed Overall Cognitive Status: Within Functional Limits for tasks assessed                                           Exercises      Shoulder Instructions       General Comments      Pertinent Vitals/ Pain       Pain Assessment Pain Assessment: Faces Faces Pain Scale: Hurts even more Pain Location: R hand, iv site Pain Descriptors / Indicators: Aching, Guarding, Grimacing Pain Intervention(s): Limited activity within patient's tolerance, Repositioned, Monitored during session  Home Living                                          Prior Functioning/Environment              Frequency  Min 1X/week        Progress Toward Goals  OT Goals(current goals can now be found in the care plan section)  Progress towards OT goals: Progressing toward goals     Plan      Co-evaluation                 AM-PAC OT "6 Clicks" Daily Activity     Outcome Measure   Help from another person eating meals?: None Help from another person taking care of personal grooming?: A Little Help from another person toileting, which includes using toliet, bedpan, or urinal?: Total Help from another person bathing (including washing, rinsing, drying)?: A Lot Help from another person to put on and taking off regular upper body clothing?: A Little Help from another person to put on and taking off regular lower body clothing?: Total 6 Click Score: 14    End of Session    OT Visit Diagnosis: Other abnormalities of gait and mobility (R26.89);Muscle weakness (generalized) (M62.81);Pain Pain - Right/Left: Left Pain - part of body: Ankle and joints of foot   Activity Tolerance Patient tolerated treatment well   Patient Left in bed;with call bell/phone within reach;with bed alarm set   Nurse Communication Other (comment) (rn states ok to work with pt. today, states ok to provide ice water and additional cup of ice pt. was requesting)        Time: 4098-1191 OT Time Calculation (min): 18 min  Charges: OT General Charges $OT Visit: 1 Visit OT Treatments $Self  Care/Home Management : 8-22 mins  Boneta Lucks, COTA/L Acute Rehabilitation [phone]   Alessandra Bevels Lorraine-COTA/L 02/01/2023, 12:19 PM

## 2023-02-01 NOTE — Progress Notes (Signed)
PROGRESS NOTE    Susan [name]  GLO:[id number] DOB: 20-Feb-1951 DOA: 01/20/2023 PCP: Donita Brooks, MD  Chief Complaint  Patient presents with   Nausea   Emesis    Brief Narrative:   72-year-old chronically sick lady with history of ESRD on hemodialysis TTS schedule, peripheral vascular disease status post right BKA, recent left wound debridement and skin grafting by Dr. 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  In the emergency room afebrile.  WBC count normal.  Glucose 309.  Upper quadrant ultrasound with cholelithiasis without evidence of acute cholecystitis.  CT scan consistent with pulmonary vascular congestion, interstitial prominence, small bilateral pleural effusion.  EKG with right bundle branch block, sinus rhythm, QTc 540.  Due to persistent nausea vomiting, she was given Ativan, Zofran and promethazine.  Patient remained in the hospital due to persistent nausea. 9/3: Patient came back from hemodialysis.  Dramatically patient ate 100% of her lunch without using any nausea medications.  Assessment & Plan:   Principal Problem:   ESRD (end stage renal disease) (HCC) Active Problems:   MDD (major depressive disorder), recurrent episode, moderate (HCC)   Historical diagnosis of generalized anxiety disorder   Secondary insomnia   Intractable nausea and vomiting   Nausea   Depression  Intractable nausea and vomiting with recent episode of colitis: Persistent symptoms.  CT scan with evidence of improving colitis.   Unclear cause Patient with prolonged QTc, limitation on using antiemetics. Currently on PPI, ativan prn Followed by GI.  Recommended conservative management.  On nystatin swish and swallow. Allowing regular diet so she can have more choices.  Complaints of N/V/D again today (improved with compazine, follow PO intake) CT scan, right upper quadrant ultrasound without any acute findings. Appreciate psychiatry assistance (remeron)   ESRD HD per renal  Chronic systolic congestive heart failure: volume per renal with HD Paroxysmal Jazzma Neidhardt-fib, currently in sinus rhythm.  On Coreg and Eliquis. Coronary artery disease, status post stent.  Stable. Hypertension, coreg, has midodrine on TTS with dialysis Anemia of chronic disease, hemoglobin stable Hypothyroidism, on synthroid Depression  Anxiety continue venlafaxine, clonazepam - Remeron added by psychiatry.  Psych recommending remeron, melatonin, venlafaxine, clonazepam. They've now signed off.    Type 2 diabetes, hyperglycemia.  Basal, bolus, and SSI - adjust prn    Peripheral vascular disease Status post right BKA.   Status post debridement and fish skin grafting left leg: Local wound care.  Dry dressing. Dopplerable PT pulse on left, needs outpatient vascular follow up ABI for LLE Eliquis, lipitor    Prolonged Qtc  RBBB: Limits our ability to use nausea medicines.  Chronic stable prolonged QTc.  Currently using prn phenergan. Will repeat EKG  -> corrected Qtc 497, continue to use compazine cautiously Repeat EKG in am     DVT prophylaxis: eliquis Code Status: full Family Communication: none Disposition:   Status is: Inpatient Remains inpatient appropriate because: continued need for inpatient care   Consultants:  Psychiatry nephrology  Procedures:  none  Antimicrobials:  Anti-infectives (From admission, onward)    None       Subjective: Compazine helping, still reports nausea/vomiting overnight  Objective: Vitals:   01/31/23 2040 02/01/23 0502 02/01/23 0511 02/01/23 0947  BP: (!) 92/45 (!) 82/39 (!) 102/47 114/60  Pulse: 82 71 73 79  Resp: 16  18  Temp: 97.7 F (36.5 C) (!) 97.5 F (36.4 C) (!) 97.5 F (36.4 C) 98 F (36.7 C)  TempSrc: Oral  Oral    SpO2: 100% 100% 100% 100%  Weight:        Intake/Output Summary (Last 24 hours) at 02/01/2023 1341 Last data filed at 02/01/2023 1243 Gross per 24 hour  Intake 840 ml  Output 0 ml  Net 840 ml   Filed Weights   01/31/23 0705 01/31/23 0803 01/31/23 1204  Weight: 78.4 kg 77.9 kg 76.2 kg    Examination:  General: No acute distress. Cardiovascular: RRR Lungs: unlabored Abdomen: Soft, nontender, nondistended Neurological: Alert and oriented 3. Moves all extremities 4 with equal strength. Cranial nerves II through XII grossly intact. Extremities: RLE BKA   Data Reviewed: I have personally reviewed following labs and imaging studies  CBC: Recent Labs  Lab 01/27/23 0730 01/29/23 0820 01/30/23 0659 01/31/23 0711 02/01/23 0701  WBC 6.7 4.5 6.1 6.8 7.5  NEUTROABS  --  3.2 5.1 5.4 4.8  HGB 9.6* 9.1* 11.7* 12.1 11.4*  HCT 32.4* 30.8* 39.3 40.5 38.5  MCV 78.1* 77.4* 77.4* 77.9* 79.1*  PLT 198 166 211 238 270    Basic Metabolic Panel: Recent Labs  Lab 01/28/23 0611 01/29/23 0820 01/30/23 0659 01/31/23 0711 02/01/23 0701  NA 130* 127*  126* 133* 132* 134*  K 4.2 4.0  4.0 3.9 3.7 3.5  CL 94* 94*  93* 96* 95* 97*  CO2 25 25  24 26 25 25   GLUCOSE 369* 490*  490* 359* 247* 160*  BUN 37* 60*  60* 35* 45* 28*  CREATININE 4.59* 5.82*  5.74* 3.65* 4.30* 3.71*  CALCIUM 8.6* 8.8*  8.8* 9.6 9.5 9.3  MG  --  1.7 1.6*  --  1.7  PHOS  --  2.5  2.5 2.2* 2.0* 2.5    GFR: Estimated Creatinine Clearance: 13.8 mL/min (Jakyron Fabro) (by C-G formula based on SCr of 3.71 mg/dL (H)).  Liver Function Tests: Recent Labs  Lab 01/29/23 0820 01/30/23 0659 01/31/23 0711 02/01/23 0701  AST 14* 18  --  18  ALT 13 13  --  14  ALKPHOS 137* 165*  --  148*  BILITOT 0.4 0.7  --  0.3  PROT 5.8* 6.6  --  6.3*  ALBUMIN 2.9*  2.9* 3.1* 3.2* 3.0*    CBG: Recent Labs  Lab 01/31/23 1234 01/31/23 1647 01/31/23 2035 02/01/23 0712 02/01/23 1144  GLUCAP 169* 225* 314* 148* 172*     No  results found for this or any previous visit (from the past 240 hour(s)).        Radiology Studies: No results found.      Scheduled Meds:  apixaban  5 mg Oral BID   atorvastatin  80 mg Oral Daily   calcitRIOL  0.75 mcg Oral Daily   carvedilol  6.25 mg Oral BID WC   Chlorhexidine Gluconate Cloth  6 each Topical Q0600   Chlorhexidine Gluconate Cloth  6 each Topical Q0600   clonazePAM  0.5 mg Oral BID   darbepoetin (ARANESP) injection - DIALYSIS  25 mcg Subcutaneous Q Tue-1800   ferric citrate  420 mg Oral TID WC   folic acid  1 mg Oral Daily   Gerhardt's butt cream   Topical BID   insulin aspart  0-5 Units Subcutaneous QHS   insulin aspart  0-9 Units Subcutaneous TID WC   insulin aspart  3 Units Subcutaneous TID WC   insulin glargine-yfgn  18 Units Subcutaneous BID   levothyroxine  75 mcg Oral QAC breakfast   melatonin  3 mg Oral QHS   midodrine  5 mg Oral Q T,Th,Sa-HD   mirtazapine  7.5 mg Oral QHS   nystatin  5 mL Oral QID   pantoprazole  40 mg Oral Daily   polyethylene glycol  17 g Oral Daily   sorbitol, milk of mag, mineral oil, glycerin (SMOG) enema  300 mL Rectal Once   sucralfate  1 g Oral TID WC & HS   venlafaxine XR  75 mg Oral Q breakfast   Continuous Infusions:     LOS: 12 days    Time spent: over 30 min    Lacretia Nicks, MD [facility]   To contact the attending provider between 7A-7P or the covering provider during after hours 7P-7A, please log into the web site [email or web address] and access using universal [place] password for that web site. If you do not have the password, please call the hospital operator.  02/01/2023, 1:41 PM

## 2023-02-01 NOTE — Progress Notes (Signed)
[facility] Progress Note   Subjective:    Seen and examined patient at bedside. She reports N/V has improved today. Tolerated yesterday's HD with net UF 2L. Weaned down to RA. Next HD 9/10.  Objective Vitals:   01/31/23 2040 02/01/23 0502 02/01/23 0511 02/01/23 0947  BP: (!) 92/45 (!) 82/39 (!) 102/47 114/60  Pulse: 82 71 73 79  Resp: 16   18  Temp: 97.7 F (36.5 C) (!) 97.5 F (36.4 C) (!) 97.5 F (36.4 C) 98 F (36.7 C)  TempSrc: Oral  Oral   SpO2: 100% 100% 100% 100%  Weight:       Physical Exam General: Chronically ill appearing female, NAD, on RA Heart: RRR, no murmurs, rubs or gallops Lungs: Clear bilaterally, normal WOB Abdomen: Soft, non-distended, +BS Extremities: Trace edema L leg, R BKA Dialysis Access:  [facility]  Filed Weights   01/31/23 0705 01/31/23 0803 01/31/23 1204  Weight: 78.4 kg 77.9 kg 76.2 kg    Intake/Output Summary (Last 24 hours) at 02/01/2023 1615 Last data filed at 02/01/2023 1243 Gross per 24 hour  Intake 840 ml  Output 0 ml  Net 840 ml    Additional Objective Labs: Basic Metabolic Panel: Recent Labs  Lab 01/30/23 0659 01/31/23 0711 02/01/23 0701  NA 133* 132* 134*  K 3.9 3.7 3.5  CL 96* 95* 97*  CO2 26 25 25   GLUCOSE 359* 247* 160*  BUN 35* 45* 28*  CREATININE 3.65* 4.30* 3.71*  CALCIUM 9.6 9.5 9.3  PHOS 2.2* 2.0* 2.5   Liver Function Tests: Recent Labs  Lab 01/29/23 0820 01/30/23 0659 01/31/23 0711 02/01/23 0701  AST 14* 18  --  18  ALT 13 13  --  14  ALKPHOS 137* 165*  --  148*  BILITOT 0.4 0.7  --  0.3  PROT 5.8* 6.6  --  6.3*  ALBUMIN 2.9*  2.9* 3.1* 3.2* 3.0*   No results for input(s): "LIPASE", "AMYLASE" in the last 168 hours. CBC: Recent Labs  Lab 01/27/23 0730 01/27/23 0730 01/29/23 0820 01/30/23 0659 01/31/23 0711 02/01/23 0701  WBC 6.7  --  4.5 6.1 6.8 7.5  NEUTROABS  --    < > 3.2 5.1 5.4 4.8  HGB 9.6*  --  9.1* 11.7* 12.1 11.4*  HCT 32.4*  --  30.8* 39.3 40.5 38.5  MCV 78.1*  --   77.4* 77.4* 77.9* 79.1*  PLT 198  --  166 211 238 270   < > = values in this interval not displayed.   Blood Culture    Component Value Date/Time   SDES TISSUE 11/14/2022 0919   SPECREQUEST NONE 11/14/2022 0919   CULT  11/14/2022 0919    FEW PROTEUS MIRABILIS RARE KLEBSIELLA PNEUMONIAE NO ANAEROBES ISOLATED FEW ENTEROCOCCUS FAECALIS    REPTSTATUS 11/19/2022 FINAL 11/14/2022 0919    Cardiac Enzymes: No results for input(s): "CKTOTAL", "CKMB", "CKMBINDEX", "TROPONINI" in the last 168 hours. CBG: Recent Labs  Lab 01/31/23 1234 01/31/23 1647 01/31/23 2035 02/01/23 0712 02/01/23 1144  GLUCAP 169* 225* 314* 148* 172*   Iron Studies: No results for input(s): "IRON", "TIBC", "TRANSFERRIN", "FERRITIN" in the last 72 hours. Lab Results  Component Value Date   INR 1.4 (H) 05/22/2022   INR 1.5 (H) 02/01/2022   INR 1.3 (H) 01/14/2022   Studies/Results: No results found.  Medications:   apixaban  5 mg Oral BID   atorvastatin  80 mg Oral Daily   calcitRIOL  0.75 mcg Oral Daily  carvedilol  6.25 mg Oral BID WC   Chlorhexidine Gluconate Cloth  6 each Topical Q0600   Chlorhexidine Gluconate Cloth  6 each Topical Q0600   clonazePAM  0.5 mg Oral BID   darbepoetin (ARANESP) injection - DIALYSIS  25 mcg Subcutaneous Q Tue-1800   ferric citrate  420 mg Oral TID WC   folic acid  1 mg Oral Daily   Gerhardt's butt cream   Topical BID   insulin aspart  0-5 Units Subcutaneous QHS   insulin aspart  0-9 Units Subcutaneous TID WC   insulin aspart  3 Units Subcutaneous TID WC   insulin glargine-yfgn  18 Units Subcutaneous BID   levothyroxine  75 mcg Oral QAC breakfast   melatonin  3 mg Oral QHS   midodrine  5 mg Oral Q T,Th,Sa-HD   mirtazapine  7.5 mg Oral QHS   nystatin  5 mL Oral QID   pantoprazole  40 mg Oral Daily   polyethylene glycol  17 g Oral Daily   sorbitol, milk of mag, mineral oil, glycerin (SMOG) enema  300 mL Rectal Once   sucralfate  1 g Oral TID WC & HS    venlafaxine XR  75 mg Oral Q breakfast    Dialysis Orders: TTS [facility]  4h  400/800   2/2 bath   [facility] TDC   Heparin 4000 + 2000 midrun  EDW was just lowered to 79 kg  - rocaltrol 0.75 mcg po three times per week - mircera 120 mcg  IV q 4 wks - last mircera given 8/10 (not given after discharge)  Just discharged 01/17/23  Assessment/Plan: # Intractable Nausea/vomiting  - per primary team/GI, CT with improving colitis. Per GI, recommend conservative management - Possible gastroparesis, previously with considerable hyperglycemia.  Blood sugars are overall improved from that admission but still elevated -noted she is on carafate- please avoid long term use in ESRD patients -with prolong QTC which limits some antiemetics, on PRN Phenergan,    # ESRD - HD per TTS schedule  - order is in for daily weights - AVF is not salvageable per prior vascular notes, using TDC   # HTN and volume   - Anasarca noted on prior admission, weight trending back up a bit  - optimize volume status with HD, however UF is limited by hypotension - note that she reports that she takes midodrine prior to outpatient HD and then was instructed to bring a dose to the clinic in case she needs another dose - we have given midodrine PRN here and have used albumin as needed. Will schedule midodrine pre-HD and order albumin PRN    # Hyponatremia  - currently 134 -optimizing volume with HD, see above   # Anemia of CKD  - Last ESA given on 01/27/23. Hgb now at goal   # Metabolic bone disease  - Renvela stopped given concern for possible GI motility disorder. She would benefit from a new binder so Auryxia started with meals.  - continue calcitriol 0.75 mcg three times a week  Salome Holmes, NP [facility] 02/01/2023,4:15 PM  LOS: 12 days

## 2023-02-02 ENCOUNTER — Other Ambulatory Visit (HOSPITAL_COMMUNITY): Payer: Self-pay

## 2023-02-02 ENCOUNTER — Telehealth (HOSPITAL_COMMUNITY): Payer: Self-pay | Admitting: Pharmacy Technician

## 2023-02-02 ENCOUNTER — Inpatient Hospital Stay (HOSPITAL_COMMUNITY): Payer: PPO

## 2023-02-02 DIAGNOSIS — I739 Peripheral vascular disease, unspecified: Secondary | ICD-10-CM | POA: Diagnosis not present

## 2023-02-02 DIAGNOSIS — N186 End stage renal disease: Secondary | ICD-10-CM | POA: Diagnosis not present

## 2023-02-02 LAB — GLUCOSE, CAPILLARY
Glucose-Capillary: 79 mg/dL (ref 70–99)
Glucose-Capillary: 84 mg/dL (ref 70–99)
Glucose-Capillary: 93 mg/dL (ref 70–99)
Glucose-Capillary: 192 mg/dL — ABNORMAL HIGH (ref 70–99)
Glucose-Capillary: 192 mg/dL — ABNORMAL HIGH (ref 70–99)

## 2023-02-02 LAB — BASIC METABOLIC PANEL
Anion gap: 14 (ref 5–15)
BUN: 43 mg/dL — ABNORMAL HIGH (ref 8–23)
CO2: 25 mmol/L (ref 22–32)
Calcium: 9.4 mg/dL (ref 8.9–10.3)
Chloride: 93 mmol/L — ABNORMAL LOW (ref 98–111)
Creatinine, Ser: 5.03 mg/dL — ABNORMAL HIGH (ref 0.44–1.00)
GFR, Estimated: 9 mL/min — ABNORMAL LOW (ref >60)
Glucose, Bld: 106 mg/dL — ABNORMAL HIGH (ref 70–99)
Potassium: 3.7 mmol/L (ref 3.5–5.1)
Sodium: 132 mmol/L — ABNORMAL LOW (ref 135–145)

## 2023-02-02 LAB — CBC
HCT: 36.5 % (ref 36.0–46.0)
Hemoglobin: 11.1 g/dL — ABNORMAL LOW (ref 12.0–15.0)
MCH: 23.6 pg — ABNORMAL LOW (ref 26.0–34.0)
MCHC: 30.4 g/dL (ref 30.0–36.0)
MCV: 77.5 fL — ABNORMAL LOW (ref 80.0–100.0)
Platelets: 207 10*3/uL (ref 150–400)
RBC: 4.71 MIL/uL (ref 3.87–5.11)
RDW: 19.6 % — ABNORMAL HIGH (ref 11.5–15.5)
WBC: 5.8 10*3/uL (ref 4.0–10.5)
nRBC: 0 % (ref 0.0–0.2)

## 2023-02-02 LAB — VAS US ABI WITH/WO TBI: Left ABI: 1.94

## 2023-02-02 LAB — MAGNESIUM: Magnesium: 1.7 mg/dL (ref 1.7–2.4)

## 2023-02-02 LAB — PHOSPHORUS: Phosphorus: 2.5 mg/dL (ref 2.5–4.6)

## 2023-02-02 MED ORDER — FERRIC CITRATE 1 GM 210 MG(FE) PO TABS
420.0000 mg | ORAL_TABLET | Freq: Three times a day (TID) | ORAL | 0 refills | Status: AC
Start: 2023-02-02 — End: 2023-03-04
  Filled 2023-02-02: qty 180, 30d supply, fill #0

## 2023-02-02 MED ORDER — INSULIN GLARGINE-YFGN 100 UNIT/ML ~~LOC~~ SOLN
15.0000 [IU] | Freq: Two times a day (BID) | SUBCUTANEOUS | Status: DC
  Administered 2023-02-02: 15 [IU] via SUBCUTANEOUS
  Filled 2023-02-02 (×2): qty 0.15

## 2023-02-02 MED ORDER — MIRTAZAPINE 7.5 MG PO TABS
7.5000 mg | ORAL_TABLET | Freq: Every day | ORAL | 0 refills | Status: DC
  Filled 2023-02-02: qty 30, 30d supply, fill #0

## 2023-02-02 MED ORDER — INSULIN GLARGINE 100 UNIT/ML SOLOSTAR PEN: 26.0000 [IU] | PEN_INJECTOR | Freq: Every day | SUBCUTANEOUS | Status: DC

## 2023-02-02 MED ORDER — NYSTATIN 100000 UNIT/ML MT SUSP
5.0000 mL | Freq: Four times a day (QID) | OROMUCOSAL | 0 refills | Status: DC
Start: 2023-02-02 — End: 2023-02-17
  Filled 2023-02-02: qty 60, 3d supply, fill #0

## 2023-02-02 MED ORDER — FOLIC ACID 1 MG PO TABS
1.0000 mg | ORAL_TABLET | Freq: Every day | ORAL | 1 refills | Status: AC
Start: 2023-02-02 — End: 2023-04-03
  Filled 2023-02-02: qty 30, 30d supply, fill #0

## 2023-02-02 MED ORDER — CALCITRIOL 0.25 MCG PO CAPS
0.7500 ug | ORAL_CAPSULE | Freq: Every day | ORAL | 0 refills | Status: DC
  Filled 2023-02-02: qty 90, 30d supply, fill #0

## 2023-02-02 MED ORDER — CHLORHEXIDINE GLUCONATE CLOTH 2 % EX PADS
6.0000 | MEDICATED_PAD | Freq: Every day | CUTANEOUS | Status: DC
  Administered 2023-02-02: 6 via TOPICAL

## 2023-02-02 MED ORDER — MELATONIN 3 MG PO TABS
3.0000 mg | ORAL_TABLET | Freq: Every day | ORAL | 0 refills | Status: AC
  Filled 2023-02-02: qty 30, 30d supply, fill #0

## 2023-02-02 MED ORDER — PROCHLORPERAZINE MALEATE 5 MG PO TABS
5.0000 mg | ORAL_TABLET | Freq: Three times a day (TID) | ORAL | 0 refills | Status: DC | PRN
  Filled 2023-02-02: qty 15, 5d supply, fill #0

## 2023-02-02 MED ORDER — INSULIN ASPART 100 UNIT/ML IJ SOLN: 0.0000 [IU] | Freq: Three times a day (TID) | INTRAMUSCULAR | Status: DC

## 2023-02-02 NOTE — Discharge Planning (Signed)
[facility] Patient Discharge Orders- [facility] CLINIC: [facility]  Patient's name: Susan [name] Admit/DC Dates: 01/20/2023 - 02/02/23  Discharge Diagnoses: Intractable nausea and vomiting   ESRD  Aranesp: Given: Yes   Date and amount of last dose: on 9/3  Last Hgb: 11.1 PRBC's Given: No Date/# of units: N/A ESA dose for discharge: mircera 30 mcg IV q 2 weeks  IV Iron dose at discharge: none  Heparin change: no  EDW Change: No New EDW: N/A  Bath Change: No  Access intervention/Change: No Details:  Hectorol/Calcitriol change: No  Discharge Labs: Calcium 9.4 Phosphorus 2.5 Albumin 3.0 K+ 3.7  IV Antibiotics: No Details:  On Coumadin?: No Last INR: Next INR: Managed By:   OTHER/APPTS/LAB ORDERS: binder changed to [place] due to possible GI motility disorder    D/C Meds to be reconciled by nurse after every discharge.  Completed By: Rogers Blocker, PA-C 02/02/2023, 4:05 PM  [facility] Pager: [phone]    Reviewed by: MD:______ RN_______

## 2023-02-02 NOTE — Plan of Care (Signed)
  Problem: Cardiac: Goal: Ability to maintain an adequate cardiac output will improve Outcome: Progressing   Problem: Health Behavior/Discharge Planning: Goal: Ability to identify and utilize available resources and services will improve Outcome: Progressing Goal: Ability to manage health-related needs will improve Outcome: Progressing   Problem: Fluid Volume: Goal: Ability to achieve a balanced intake and output will improve Outcome: Progressing   Problem: Metabolic: Goal: Ability to maintain appropriate glucose levels will improve Outcome: Progressing   Problem: Nutritional: Goal: Maintenance of adequate nutrition will improve Outcome: Progressing Goal: Maintenance of adequate weight for body size and type will improve Outcome: Progressing   Problem: Respiratory: Goal: Will regain and/or maintain adequate ventilation Outcome: Progressing   Problem: Urinary Elimination: Goal: Ability to achieve and maintain adequate renal perfusion and functioning will improve Outcome: Progressing   Problem: Health Behavior/Discharge Planning: Goal: Ability to manage health-related needs will improve Outcome: Progressing   Problem: Clinical Measurements: Goal: Ability to maintain clinical measurements within normal limits will improve Outcome: Progressing Goal: Cardiovascular complication will be avoided Outcome: Progressing   Problem: Activity: Goal: Risk for activity intolerance will decrease Outcome: Progressing   Problem: Nutrition: Goal: Adequate nutrition will be maintained Outcome: Progressing   Problem: Coping: Goal: Level of anxiety will decrease Outcome: Progressing   Problem: Elimination: Goal: Will not experience complications related to bowel motility Outcome: Progressing Goal: Will not experience complications related to urinary retention Outcome: Progressing   Problem: Pain Managment: Goal: General experience of comfort will improve Outcome: Progressing    Problem: Safety: Goal: Ability to remain free from injury will improve Outcome: Progressing   Problem: Skin Integrity: Goal: Risk for impaired skin integrity will decrease Outcome: Progressing   Problem: Education: Goal: Ability to describe self-care measures that may prevent or decrease complications (Diabetes Survival Skills Education) will improve Outcome: Progressing Goal: Individualized Educational Video(s) Outcome: Progressing   Problem: Coping: Goal: Ability to adjust to condition or change in health will improve Outcome: Progressing   Problem: Fluid Volume: Goal: Ability to maintain a balanced intake and output will improve Outcome: Progressing   Problem: Health Behavior/Discharge Planning: Goal: Ability to identify and utilize available resources and services will improve Outcome: Progressing Goal: Ability to manage health-related needs will improve Outcome: Progressing   Problem: Metabolic: Goal: Ability to maintain appropriate glucose levels will improve Outcome: Progressing   Problem: Nutritional: Goal: Maintenance of adequate nutrition will improve Outcome: Progressing Goal: Progress toward achieving an optimal weight will improve Outcome: Progressing   Problem: Skin Integrity: Goal: Risk for impaired skin integrity will decrease Outcome: Progressing   Problem: Tissue Perfusion: Goal: Adequacy of tissue perfusion will improve Outcome: Progressing   Problem: Education: Goal: Individualized Educational Video(s) Outcome: Progressing   Problem: Fluid Volume: Goal: Compliance with measures to maintain balanced fluid volume will improve Outcome: Progressing   Problem: Health Behavior/Discharge Planning: Goal: Ability to manage health-related needs will improve Outcome: Progressing   Problem: Nutritional: Goal: Ability to make healthy dietary choices will improve Outcome: Progressing   Problem: Clinical Measurements: Goal: Complications related  to the disease process, condition or treatment will be avoided or minimized Outcome: Progressing

## 2023-02-02 NOTE — Inpatient Diabetes Management (Signed)
Inpatient Diabetes Program Recommendations  AACE/ADA: New Consensus Statement on Inpatient Glycemic Control (2015)  Target Ranges:  Prepandial:   less than 140 mg/dL      Peak postprandial:   less than 180 mg/dL (1-2 hours)      Critically ill patients:  140 - 180 mg/dL   Lab Results  Component Value Date   GLUCAP 93 02/02/2023   HGBA1C 7.9 (H) 08/27/2022    Review of Glycemic Control  Latest Reference Range & Units 02/01/23 07:12 02/01/23 11:44 02/01/23 16:28 02/01/23 20:50 02/02/23 07:32 02/02/23 07:54 02/02/23 08:27  Glucose-Capillary 70 - 99 mg/dL 244 (H)  Novolog 4 units 172 (H)  Novolog 5 units 354 (H)  Novolog 12 untis 414 (H)  Novolog 11 units 79 84 93   Diabetes history: DM 2 Outpatient Diabetes medications: Novolog sliding scale: <100 - 0 units, 100-150 - give 5 units, 151-200 -  give 10 units, 201-150 -  give 15 units, >250 -give 18 units, <300  Lantus 13 units, >300 give Lantus 26 units Current orders for Inpatient glycemic control:  Semglee 18 units bid Novolog 0-9 units tid + hs Novolog 3 units tid meal coverage elevated renal function/ESRD dialysis TTS  Endocrinologist: Dr. Talmage Nap last visit less than 1 month ago Sees Endocrinology every 3 months  Note: Postprandial glucose trends increasing still into the 400's last night, however large boluses of Novolog drops glucose. Increase Novolog meal coverage slightly.  Inpatient Diabetes Program Recommendations:    -   Increase Novolog meal coverage to 5 units tid meal coverage if eating >50% of meals    Thanks,  Christena Deem RN, MSN, BC-ADM Inpatient Diabetes Coordinator Team Pager [phone] (8a-5p)

## 2023-02-02 NOTE — Progress Notes (Signed)
D/C order noted. Contacted [facility] to advise clinic of pt's d/c today and that pt should resume care tomorrow.   Olivia Canter Renal Navigator [phone]

## 2023-02-02 NOTE — Progress Notes (Signed)
[facility] Progress Note   Subjective:   Pt reports she is feeling better today. She reports nausea is improved and she is hungry today. Denies SOB, CP, dizziness, abdominal pain, vomiting.   Objective Vitals:   02/01/23 2048 02/02/23 0439 02/02/23 0704 02/02/23 0729  BP: (!) 100/44 (!) 93/38  117/64  Pulse: 77 72  77  Resp: 16 16  18   Temp: (!) 97.5 F (36.4 C) (!) 97.5 F (36.4 C)  97.6 F (36.4 C)  TempSrc: Oral   Oral  SpO2: 100% 100%  100%  Weight:   81.2 kg    Physical Exam General: Chronically ill appearing female, NAD, on RA Heart: RRR, no murmurs, rubs or gallops Lungs: Clear bilaterally, normal WOB Abdomen: Soft, non-distended, +BS Extremities: Trace edema L leg, R BKA Dialysis Access:  [facility]  Additional Objective Labs: Basic Metabolic Panel: Recent Labs  Lab 01/31/23 0711 02/01/23 0701 02/02/23 0516  NA 132* 134* 132*  K 3.7 3.5 3.7  CL 95* 97* 93*  CO2 25 25 25   GLUCOSE 247* 160* 106*  BUN 45* 28* 43*  CREATININE 4.30* 3.71* 5.03*  CALCIUM 9.5 9.3 9.4  PHOS 2.0* 2.5 2.5   Liver Function Tests: Recent Labs  Lab 01/29/23 0820 01/30/23 0659 01/31/23 0711 02/01/23 0701  AST 14* 18  --  18  ALT 13 13  --  14  ALKPHOS 137* 165*  --  148*  BILITOT 0.4 0.7  --  0.3  PROT 5.8* 6.6  --  6.3*  ALBUMIN 2.9*  2.9* 3.1* 3.2* 3.0*   No results for input(s): "LIPASE", "AMYLASE" in the last 168 hours. CBC: Recent Labs  Lab 01/29/23 0820 01/30/23 0659 01/31/23 0711 02/01/23 0701 02/02/23 0516  WBC 4.5 6.1 6.8 7.5 5.8  NEUTROABS 3.2 5.1 5.4 4.8  --   HGB 9.1* 11.7* 12.1 11.4* 11.1*  HCT 30.8* 39.3 40.5 38.5 36.5  MCV 77.4* 77.4* 77.9* 79.1* 77.5*  PLT 166 211 238 270 207   Blood Culture    Component Value Date/Time   SDES TISSUE 11/14/2022 0919   SPECREQUEST NONE 11/14/2022 0919   CULT  11/14/2022 0919    FEW PROTEUS MIRABILIS RARE KLEBSIELLA PNEUMONIAE NO ANAEROBES ISOLATED FEW ENTEROCOCCUS FAECALIS    REPTSTATUS  11/19/2022 FINAL 11/14/2022 0919    Cardiac Enzymes: No results for input(s): "CKTOTAL", "CKMB", "CKMBINDEX", "TROPONINI" in the last 168 hours. CBG: Recent Labs  Lab 02/01/23 1628 02/01/23 2050 02/02/23 0732 02/02/23 0754 02/02/23 0827  GLUCAP 354* 414* 79 84 93   Iron Studies: No results for input(s): "IRON", "TIBC", "TRANSFERRIN", "FERRITIN" in the last 72 hours. @lablastinr3 @ Studies/Results: No results found. Medications:   apixaban  5 mg Oral BID   atorvastatin  80 mg Oral Daily   calcitRIOL  0.75 mcg Oral Daily   carvedilol  6.25 mg Oral BID WC   Chlorhexidine Gluconate Cloth  6 each Topical Q0600   clonazePAM  0.5 mg Oral BID   darbepoetin (ARANESP) injection - DIALYSIS  25 mcg Subcutaneous Q Tue-1800   ferric citrate  420 mg Oral TID WC   folic acid  1 mg Oral Daily   Gerhardt's butt cream   Topical BID   insulin aspart  0-5 Units Subcutaneous QHS   insulin aspart  0-9 Units Subcutaneous TID WC   insulin aspart  3 Units Subcutaneous TID WC   insulin glargine-yfgn  15 Units Subcutaneous BID   levothyroxine  75 mcg Oral QAC breakfast   melatonin  3 mg Oral QHS   midodrine  5 mg Oral Q T,Th,Sa-HD   mirtazapine  7.5 mg Oral QHS   nystatin  5 mL Oral QID   pantoprazole  40 mg Oral Daily   polyethylene glycol  17 g Oral Daily   sorbitol, milk of mag, mineral oil, glycerin (SMOG) enema  300 mL Rectal Once   sucralfate  1 g Oral TID WC & HS   venlafaxine XR  75 mg Oral Q breakfast    Outpatient Dialysis Orders: TTS [facility]  4h  400/800   2/2 bath   [facility] TDC   Heparin 4000 + 2000 midrun  EDW was just lowered to 79 kg  - rocaltrol 0.75 mcg po three times per week - mircera 120 mcg  IV q 4 wks - last mircera given 8/10 (not given after discharge)  Just discharged 01/17/23  Assessment/Plan: # Intractable Nausea/vomiting  - per primary team/GI, CT with improving colitis. Per GI, recommend conservative management - Possible gastroparesis, previously with  considerable hyperglycemia.  Blood sugars are overall improved from that admission but still elevated -noted she is on carafate- please avoid long term use in ESRD patients -with prolong QTC which limits some antiemetics, on PRN Phenergan   # ESRD - HD per TTS schedule  - order is in for daily weights - AVF is not salvageable per prior vascular notes, using TDC   # HTN and volume   - Anasarca noted on prior admission, weight trending back up a bit  - optimize volume status with HD, however UF is limited by hypotension - note that she reports that she takes midodrine prior to outpatient HD and then was instructed to bring a dose to the clinic in case she needs another dose. Will schedule midodrine pre-HD and order albumin PRN    # Hyponatremia  - currently 132 -optimizing volume with HD, see above   # Anemia of CKD  - Last ESA given on 01/27/23. Hgb now at goal   # Metabolic bone disease  - Renvela stopped given concern for possible GI motility disorder. She would benefit from a new binder so Auryxia started with meals.  - continue calcitriol 0.75 mcg three times a week  Rogers Blocker, PA-C 02/02/2023, 9:00 AM  [facility] Pager: [phone]

## 2023-02-02 NOTE — Progress Notes (Signed)
DISCHARGE NOTE HOME Susan [name] [facility] to be discharged Home per MD order. Discussed prescriptions and follow up appointments with the patient. Prescriptions given to patient; medication list explained in detail. Patient verbalized understanding.  Skin clean, dry and intact without evidence of skin break down, no evidence of skin tears noted. IV catheter discontinued intact. Site without signs and symptoms of complications. Dressing and pressure applied. Pt denies pain at the site currently. No complaints noted.  Patient free of lines, drains, and wounds.   An After Visit Summary (AVS) was printed and given to the patient. Patient escorted via wheelchair, and discharged home via private auto with husband Susan Clent Jacks, RN

## 2023-02-02 NOTE — TOC Transition Note (Signed)
Transition of Care [facility]) - CM/SW Discharge Note   Patient Details  Name: Susan [name] MRN: [id number] Date of Birth: 09/23/1950  Transition of Care [facility]) CM/SW Contact:  Tom-Johnson, Hershal Coria, RN Phone Number: 02/02/2023, 1:03 PM   Clinical Narrative:     Patient is scheduled for discharge today.  Readmission Risk Assessment done. Home Health info, Outpatient referral, hospital f/u and discharge instructions on AVS. Prescriptions sent to [facility] pharmacy and meds will be delivered to patient at bedside prior discharge. Husband, Ramon Dredge to transport at discharge.  No further TOC needs noted.         Final next level of care: Home w Home Health Services Barriers to Discharge: Barriers Resolved   Patient Goals and CMS Choice CMS Medicare.gov Compare Post Acute Care list provided to:: Patient Choice offered to / list presented to : Patient, Spouse  Discharge Placement                  Patient to be transferred to facility by: Husband Name of family member notified: [facility] Additional resources added to the After Visit Summary for                  DME Arranged: N/A DME Agency: NA       HH Arranged: PT, OT, RN, Nurse's Aide, Social Work [employer] Agency: [facility] Date [facility] Agency Contacted: 01/28/23 Time HH Agency Contacted: 1140 Representative spoke with at [facility] Agency: Kandee Keen  Social Determinants of Health (SDOH) Interventions SDOH Screenings   Food Insecurity: No Food Insecurity (01/15/2023)  Housing: Low Risk  (01/15/2023)  Transportation Needs: No Transportation Needs (01/15/2023)  Utilities: Not At Risk (01/15/2023)  Alcohol Screen: Low Risk  (08/16/2021)  Depression (PHQ2-9): Low Risk  (09/29/2022)  Financial Resource Strain: Low Risk  (08/16/2021)  Physical Activity: Inactive (08/16/2021)  Social Connections: Moderately Isolated (08/16/2021)  Stress: No Stress Concern Present (08/16/2021)  Tobacco Use: Medium Risk  (01/20/2023)     Readmission Risk Interventions    01/21/2023    3:09 PM 09/04/2022    5:11 PM 05/12/2022    5:04 PM  Readmission Risk Prevention Plan  Transportation Screening Complete Complete Complete  Medication Review (RN Care Manager) Referral to Pharmacy Complete   PCP or Specialist appointment within 3-5 days of discharge Complete Complete   HRI or Home Care Consult Complete Complete   SW Recovery Care/Counseling Consult Complete Complete Complete  Palliative Care Screening Not Applicable Not Applicable   Skilled Nursing Facility Not Applicable Complete Complete

## 2023-02-02 NOTE — Plan of Care (Signed)
Patient without complaints of nausea this shift. Slept well. Will continue to monitor.   Problem: Health Behavior/Discharge Planning: Goal: Ability to identify and utilize available resources and services will improve Outcome: Progressing   Problem: Nutritional: Goal: Maintenance of adequate nutrition will improve Outcome: Progressing   Problem: Clinical Measurements: Goal: Ability to maintain clinical measurements within normal limits will improve Outcome: Progressing

## 2023-02-02 NOTE — Telephone Encounter (Signed)
Request is too soon for refill, last refill 01/05/23 for 30 and 3 refills, patient should check with pharmacy.  Requested Prescriptions  Pending Prescriptions Disp Refills   venlafaxine XR (EFFEXOR-XR) 75 MG 24 hr capsule 30 capsule 3    Sig: Take 1 capsule (75 mg total) by mouth daily with breakfast. Stop trintellix     Psychiatry: Antidepressants - SNRI - desvenlafaxine & venlafaxine Failed - 01/30/2023  3:00 PM      Failed - Cr in normal range and within 360 days    Creat  Date Value Ref Range Status  03/26/2022 6.22 (H) 0.60 - 1.00 mg/dL Final   Creatinine, Ser  Date Value Ref Range Status  02/02/2023 5.03 (H) 0.44 - 1.00 mg/dL Final   Creatinine, Urine  Date Value Ref Range Status  08/14/2020 35.12 mg/dL Final    Comment:    Performed at [facility], [address] [place], [place] [address]         Failed - Last BP in normal range    BP Readings from Last 1 Encounters:  02/02/23 117/64         Failed - Valid encounter within last 6 months    Recent Outpatient Visits           1 year ago Cellulitis of lower extremity, unspecified laterality   [facility] Tanya Nones Priscille Heidelberg, MD   1 year ago Diarrhea, unspecified type   [facility] [name], Priscille Heidelberg, MD   1 year ago Dysuria   [facility] Donita Brooks, MD   1 year ago Stage 4 chronic kidney disease (HCC)   [facility] Donita Brooks, MD   1 year ago Chronic diastolic CHF (congestive heart failure) (HCC)   [facility] [name], Priscille Heidelberg, MD       Future Appointments             In 3 weeks Fanny Dance, MD [facility], [facility]            Failed - Lipid Panel in normal range within the last 12 months    Cholesterol  Date Value Ref Range Status  02/01/2022 92 0 - 200 mg/dL Final   LDL Cholesterol (Calc)  Date Value Ref Range Status  01/17/2019 65 mg/dL (calc) Final     Comment:    Reference range: <100 . Desirable range <100 mg/dL for primary prevention;   <70 mg/dL for patients with CHD or diabetic patients  with > or = 2 CHD risk factors. Marland Kitchen LDL-C is now calculated using the Martin-Hopkins  calculation, which is a validated novel method providing  better accuracy than the Friedewald equation in the  estimation of LDL-C.  Horald Pollen et al. Lenox Ahr. 6045;409(81): 2061-2068  (http://education.[employer].com/faq/FAQ164)    LDL Cholesterol  Date Value Ref Range Status  02/01/2022 29 0 - 99 mg/dL Final    Comment:           Total Cholesterol/HDL:CHD Risk Coronary Heart Disease Risk Table                     Men   Women  1/2 Average Risk   3.4   3.3  Average Risk       5.0   4.4  2 X Average Risk   9.6   7.1  3 X Average Risk  23.4   11.0  Use the calculated Patient Ratio above and the CHD Risk Table to determine the patient's CHD Risk.        ATP III CLASSIFICATION (LDL):  <100     mg/dL   Optimal  623-762  mg/dL   Near or Above                    Optimal  130-159  mg/dL   Borderline  831-517  mg/dL   High  >616     mg/dL   Very High Performed at [facility], [address] [place], [place] [address]    HDL  Date Value Ref Range Status  02/01/2022 34 (L) >40 mg/dL Final   Triglycerides  Date Value Ref Range Status  02/01/2022 143 <150 mg/dL Final         Passed - Completed PHQ-2 or PHQ-9 in the last 360 days

## 2023-02-02 NOTE — Discharge Summary (Addendum)
Physician Discharge Summary  Susan [name] WNU:[id number] DOB: 05/21/1951 DOA: 01/20/2023  PCP: Susan Brooks, MD  Admit date: 01/20/2023 Discharge date: 02/02/2023  Time spent: 40 minutes  Recommendations for Outpatient Follow-up:  Follow outpatient CBC/CMP  Follow nausea/vomiting symptoms outpatient Follow ABI LLE, may need vascular referral Follow  blood sugar outpatient, adjust diabetes regimen as needed Follow Qtc outpatient Follow hx splenic infarct outpatient  Prior auth pending for auryxia, follow with renal outpatient regarding this  Discharge Diagnoses:  Principal Problem:   ESRD (end stage renal disease) (HCC) Active Problems:   MDD (major depressive disorder), recurrent episode, moderate (HCC)   Historical diagnosis of generalized anxiety disorder   Secondary insomnia   Intractable nausea and vomiting   Nausea   Depression   Discharge Condition: stable  Diet recommendation: diabetic  Filed Weights   01/31/23 0803 01/31/23 1204 02/02/23 0704  Weight: 77.9 kg 76.2 kg 81.2 kg    History of present illness:   72 year old chronically sick lady with history of ESRD on hemodialysis TTS schedule, peripheral vascular disease status post right BKA, recent left wound debridement and skin grafting by Dr. 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   Workup unrevealing for cause.   CT and RUQ [name] not suggestive of cause.  Seen by GI, recommended 14 day course of nystatin with prior hx candidal esophagitis.    She's gradually improved.  Stable for discharge 9/9.    Hospital Course:  Assessment and Plan:  Intractable nausea and vomiting with recent episode of colitis: Persistent symptoms.  CT scan with evidence of improving colitis.   Unclear  cause Patient with prolonged QTc, limitation on using antiemetics. Currently on PPI, ativan prn Followed by GI.  Recommended conservative management. Nystatin x14 days. Allowing regular diet so she can have more choices. Overall improved, will discharge with prn compazine.  Follow with GI outpatient.  CT scan, right upper quadrant ultrasound without any acute findings. Appreciate psychiatry assistance (remeron)   ESRD HD per renal  Chronic systolic congestive heart failure: volume per renal with HD Paroxysmal Susan [name]-fib, currently in sinus rhythm.  On Coreg and Eliquis.  Resume amiodarone at discharge Coronary artery disease, status post stent.  Stable. Hypertension, coreg, has midodrine on TTS with dialysis Anemia of chronic disease, hemoglobin stable Hypothyroidism, on synthroid Depression  Anxiety continue venlafaxine, clonazepam - Remeron added by psychiatry.  Psych recommending remeron, melatonin, venlafaxine, clonazepam. They've now signed off.    Splenic Infarcts Noted, she's on eliquis Follow outpatient.  Type 2 diabetes, hyperglycemia.  Basal, bolus, and SSI - adjust prn  Continue prior to admission regimen at discharge, follow closely with wide swings noted here - discussed with husband   Peripheral vascular disease Status post right BKA.   Status post debridement and fish skin grafting left leg: Local wound care.  Dry dressing. Dopplerable PT pulse on left, needs outpatient vascular follow up ABI for LLE - pending at time for discharge, will see if we can get it today, otherwise, needs outpatient Eliquis, lipitor    Prolonged Qtc  RBBB: Limits our ability to use nausea medicines.  Chronic stable prolonged QTc.  Currently using prn phenergan. Will repeat EKG  -> corrected Qtc 483, continue to use compazine cautiously Follow qt outpatient      Procedures: none   Consultations: GI  Psychiatry renal  Discharge Exam: Vitals:   02/02/23 0439 02/02/23 0729  BP: (!)  93/38 117/64  Pulse: 72 77  Resp: 16 18  Temp: (!) 97.5 F (36.4 C) 97.6 F (36.4 C)  SpO2: 100% 100%   Feels better, tolerating PO Discussed d/c plan with husband  General: No acute distress. Cardiovascular: RRR Lungs: unlabored Neurological: Alert and oriented 3. Moves all extremities 4 with equal strength. Cranial nerves II through XII grossly intact. Extremities: RLE BKA, LLE with intact dressing  Discharge Instructions   Discharge Instructions     Call MD for:  difficulty breathing, headache or visual disturbances   Complete by: As directed    Call MD for:  extreme fatigue   Complete by: As directed    Call MD for:  hives   Complete by: As directed    Call MD for:  persistant dizziness or light-headedness   Complete by: As directed    Call MD for:  persistant nausea and vomiting   Complete by: As directed    Call MD for:  redness, tenderness, or signs of infection (pain, swelling, redness, odor or green/yellow discharge around incision site)   Complete by: As directed    Call MD for:  severe uncontrolled pain   Complete by: As directed    Call MD for:  temperature >100.4   Complete by: As directed    Diet - low sodium heart healthy   Complete by: As directed    Discharge instructions   Complete by: As directed    You were seen for nausea, vomiting, and poor oral intake.  You've had Susan [name] prolonged hospitalization, but things have finally begun to improve.  Ultimately the cause is unclear.  We'll send you home with compazine to use as needed.  You can follow with gastroenterology as an outpatient to continue to work this up and think about other strategies for your symptoms.  You should reschedule your appointment with Dr. Lajoyce [name] for your left leg.  Your left leg has poor pulses, you need ankle brachial indices as an outpatient and possibly vascular follow up.   Discharge wound care:   Complete by: As directed    Change positions frequently, monitor the sacral wound  closely and consider outpatient follow up with wound care   Increase activity slowly   Complete by: As directed       Allergies as of 02/02/2023       Reactions   Cephalexin Diarrhea, Other (See Comments)   Codeine Nausea And Vomiting, Other (See Comments)        Medication List     STOP taking these medications    cefdinir 300 MG capsule Commonly known as: OMNICEF   metoCLOPramide 5 MG tablet Commonly known as: REGLAN   NovoLOG FlexPen 100 UNIT/ML FlexPen Generic drug: insulin aspart Replaced by: insulin aspart 100 UNIT/ML injection   senna-docusate 8.6-50 MG tablet Commonly known as: Senokot-S   sevelamer carbonate 800 MG tablet Commonly known as: RENVELA       TAKE these medications    acetaminophen 325 MG tablet Commonly known as: TYLENOL Take 2 tablets (650 mg total) by mouth every 6 (six) hours as needed for mild pain (or Fever >/= 101).   albuterol 108 (90 Base) MCG/ACT inhaler Commonly known as: VENTOLIN HFA Inhale 1-2 puffs into the lungs every 6 (six) hours as needed for wheezing or shortness of breath.   amiodarone 200 MG tablet Commonly known as: PACERONE Take 1 tablet (200  mg total) by mouth daily.   ascorbic acid 500 MG tablet Commonly known as: VITAMIN C Take 1 tablet (500 mg total) by mouth daily.   atorvastatin 80 MG tablet Commonly known as: LIPITOR TAKE 1 TABLET BY MOUTH EVERY DAY   carvedilol 6.25 MG tablet Commonly known as: COREG Take 6.25 mg by mouth 2 (two) times daily with Trevin Gartrell meal.   clonazePAM 0.5 MG disintegrating tablet Commonly known as: KLONOPIN Take 1 tablet (0.5 mg total) by mouth 2 (two) times daily.   Eliquis 5 MG Tabs tablet Generic drug: apixaban Take 1 tablet (5 mg total) by mouth 2 (two) times daily.   ferric citrate 1 GM 210 MG(Fe) tablet Commonly known as: AURYXIA Take 2 tablets (420 mg total) by mouth 3 (three) times daily with meals.   folic acid 1 MG tablet Commonly known as: FOLVITE Take 1 tablet  (1 mg total) by mouth daily.   insulin aspart 100 UNIT/ML injection Commonly known as: novoLOG Inject 0-15 Units into the skin 3 (three) times daily with meals. CBG < 70: treat low blood sugar CBG 70 - 120: 0 units CBG 121 - 150: 2 units CBG 151 - 200: 3 units CBG 201 - 250: 5 units CBG 251 - 300: 8 units CBG 301 - 350: 11 units CBG 351 - 400: 15 units CBG > 400: call MD Replaces: NovoLOG FlexPen 100 UNIT/ML FlexPen   insulin glargine 100 UNIT/ML Solostar Pen Commonly known as: LANTUS Inject 26 Units into the skin daily. What changed: how much to take   levothyroxine 75 MCG tablet Commonly known as: SYNTHROID Take 1 tablet (75 mcg total) by mouth daily before breakfast.   methocarbamol 750 MG tablet Commonly known as: ROBAXIN Take 1 tablet (750 mg total) by mouth every 6 (six) hours as needed for muscle spasms.   midodrine 10 MG tablet Commonly known as: PROAMATINE Take 1 tablet (10 mg total) by mouth Every Tuesday,Thursday,and Saturday with dialysis.   mirtazapine 7.5 MG tablet Commonly known as: REMERON Take 1 tablet (7.5 mg total) by mouth at bedtime.   pantoprazole 40 MG tablet Commonly known as: Protonix Take 1 tablet (40 mg total) by mouth 2 (two) times daily before Marcelis Wissner meal.   polyethylene glycol 17 g packet Commonly known as: MIRALAX / GLYCOLAX Take 17 g by mouth daily. What changed:  when to take this additional instructions   prochlorperazine 5 MG tablet Commonly known as: COMPAZINE Take 1 tablet (5 mg total) by mouth every 8 (eight) hours as needed for up to 5 days for nausea or vomiting.   ramelteon 8 MG tablet Commonly known as: ROZEREM Take 1 tablet (8 mg total) by mouth at bedtime.   venlafaxine XR 75 MG 24 hr capsule Commonly known as: EFFEXOR-XR Take 1 capsule (75 mg total) by mouth daily with breakfast. Stop trintellix   Zinc Sulfate 220 (50 Zn) MG Tabs Take 1 tablet (220 mg total) by mouth daily.               Discharge Care  Instructions  (From admission, onward)           Start     Ordered   02/02/23 0000  Discharge wound care:       Comments: Change positions frequently, monitor the sacral wound closely and consider outpatient follow up with wound care   02/02/23 1225           Allergies  Allergen Reactions   Cephalexin Diarrhea and Other (  See Comments)   Codeine Nausea And Vomiting and Other (See Comments)    Follow-up Information     [facility] Follow up.   Specialty: Home Health Services Why: Someone will call you to schedule first home visit. Contact information: [address] [place] [address] [phone]         Susan Brooks, MD Follow up.   Specialty: Family Medicine Contact information: [address] [place] [address] [phone]         Nadara Mustard, MD Follow up.   Specialty: Orthopedic Surgery Contact information: [address] [place] [address] [phone]         Bensimhon, Bevelyn Buckles, MD Follow up.   Specialty: Cardiology Contact information: [address] [place] [address] [phone]                  The results of significant diagnostics from this hospitalization (including imaging, microbiology, ancillary and laboratory) are listed below for reference.    Significant Diagnostic Studies: CT ABDOMEN PELVIS W CONTRAST  Result Date: 01/20/2023 CLINICAL DATA:  Right lower quadrant abdominal pain EXAM: CT ABDOMEN AND PELVIS WITH CONTRAST TECHNIQUE: Multidetector CT imaging of the abdomen and pelvis was performed using the standard protocol following bolus administration of intravenous contrast. RADIATION DOSE REDUCTION: This exam was performed according to the departmental dose-optimization program which includes automated exposure control, adjustment of the mA and/or kV according to patient size and/or use of iterative reconstruction technique. CONTRAST:  75mL  OMNIPAQUE IOHEXOL 350 MG/ML SOLN COMPARISON:  01/15/2023 FINDINGS: Lower chest: Interstitial lobular septal thickening and small pleural effusions at the lung bases. Coronary atherosclerosis. Hepatobiliary: Lobulated appearance of the inferior lobe right liver but no generalized surface lobulation or other typical morphologic changes of cirrhosis.Layering gallstones. No evidence of biliary inflammation or ductal dilatation. Pancreas: Unremarkable. Spleen: Hypoenhancing areas in the anterior spleen are stable and most consistent with infarct. Scarring in the subcapsular high left spleen Adrenals/Urinary Tract: Negative adrenals. No hydronephrosis or stone. Renal cortical lobulation likely from scarring, especially on the right. Unremarkable bladder. Stomach/Bowel: No evidence of bowel obstruction or inflammation. No convincing bowel wall thickening aside from areas of luminal collapse. No appendicitis. Numerous distal colonic diverticula. Vascular/Lymphatic: No acute vascular abnormality. Extensive atheromatous calcification of the aorta and branch vessels. No mass or adenopathy. Reproductive:Hysterectomy Other: No ascites or pneumoperitoneum. Musculoskeletal: No acute abnormalities. IMPRESSION: 1. No specific cause for right lower quadrant pain. No appendicitis. 2. Pulmonary edema and small pleural effusions seen at the lung bases 3. No progression of small splenic infarcts since prior. 4. Cholelithiasis, colonic diverticulosis, and extensive atheromatous calcification. Electronically Signed   By: Tiburcio Pea M.D.   On: 01/20/2023 07:16   US Abdomen Limited RUQ (LIVER/GB)  Result Date: 01/20/2023 CLINICAL DATA:  Right upper quadrant abdominal pain EXAM: ULTRASOUND ABDOMEN LIMITED RIGHT UPPER QUADRANT COMPARISON:  01/15/2023 FINDINGS: Gallbladder: No gallbladder wall thickening, sludge or pericholecystic fluid. Gallstones are identified which measure up to 1.5 cm. Negative sonographic Murphy's sign. Common  bile duct: Diameter: 5.1 mm Liver: No focal lesion identified. Within normal limits in parenchymal echogenicity. Portal vein is patent on color Doppler imaging with normal direction of blood flow towards the liver. Other: None. IMPRESSION: Cholelithiasis. No secondary signs of acute cholecystitis. Electronically Signed   By: Signa Kell M.D.   On: 01/20/2023 05:46   DG Chest Portable 1 View  Result Date: 01/20/2023 CLINICAL DATA:  Shortness  of breath.  Nausea and vomiting. EXAM: PORTABLE CHEST 1 VIEW COMPARISON:  01/15/2023. FINDINGS: Heart is enlarged and the mediastinal contour is within normal limits. There is atherosclerotic calcification of the aorta. The pulmonary vasculature is distended. Interstitial prominence is present bilaterally. There are small bilateral pleural effusions. No pneumothorax is seen. Raejean Swinford right internal jugular central venous catheter is stable. No acute osseous abnormality. IMPRESSION: 1. Cardiomegaly with pulmonary vascular congestion. 2. Interstitial prominence bilaterally, likely edema, not significantly changed from the prior exam. 3. Small bilateral pleural effusions. Electronically Signed   By: Thornell Sartorius M.D.   On: 01/20/2023 04:18   DG CHEST PORT 1 VIEW  Result Date: 01/15/2023 CLINICAL DATA:  Hypoxia, end-stage renal disease EXAM: PORTABLE CHEST 1 VIEW COMPARISON:  11/17/2022 FINDINGS: Single frontal view of the chest demonstrates stable right internal jugular dialysis catheter. The cardiac silhouette remains enlarged. Increased pulmonary vascular congestion, with developing interstitial and ground-glass opacities greatest at the lung bases consistent with volume overload and developing edema. Trace left effusion. No pneumothorax. IMPRESSION: 1. Volume overload, with developing bibasilar pulmonary edema. Electronically Signed   By: Sharlet Salina M.D.   On: 01/15/2023 17:40   CT ABDOMEN PELVIS W CONTRAST  Result Date: 01/15/2023 CLINICAL DATA:  72 year old female  with lower abdominal pain, left lower quadrant pain. Vomiting and diarrhea onset yesterday. EXAM: CT ABDOMEN AND PELVIS WITH CONTRAST TECHNIQUE: Multidetector CT imaging of the abdomen and pelvis was performed using the standard protocol following bolus administration of intravenous contrast. RADIATION DOSE REDUCTION: This exam was performed according to the departmental dose-optimization program which includes automated exposure control, adjustment of the mA and/or kV according to patient size and/or use of iterative reconstruction technique. CONTRAST:  60mL OMNIPAQUE IOHEXOL 350 MG/ML SOLN COMPARISON:  CT Abdomen and Pelvis 10/07/2022. FINDINGS: Lower chest: Layering small left pleural effusion has decreased since April but not resolved. And there is Harlene Petralia symmetric new layering small right pleural effusion. Cardiomegaly. No pericardial effusion. Lung base atelectasis and septal thickening. No lung base consolidation. Hepatobiliary: Hepatic steatosis with fatty sparing at the gallbladder fossa. Cholelithiasis. No gallbladder inflammation by CT. No bile duct enlargement. Pancreas: Negative. Spleen: Chronic small calcified splenic granulomas. 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 But there is Betsi Crespi new posterosuperior spur small splenic pole infarct on series 6, image 59. No perisplenic fluid or inflammation. Chronic splenic scarring at the anterior superior pole is also stable such as from another prior infarct. Adrenals/Urinary Tract: Stable adrenal gland thickening such as due to hyperplasia. Nonobstructed kidneys with renal vascular calcifications and some cortical atrophy. No renal mass. Little renal contrast excretion on the delayed images. Nondilated ureters. Unremarkable bladder. Stomach/Bowel: Rectum is more decompressed compared to April but there is circumferential and somewhat irregular rectal wall thickening up to 2 cm on series 3, image 72. See also coronal image 27. The distal  sigmoid is decompressed, redundant but otherwise normal. Proximal sigmoid with retained stool and diverticulosis. Similar stool and diverticulosis continuing into the distal descending colon. No active inflammation in those segments. Diverticulosis at the splenic flexure and in the transverse colon with no definite inflammation. More decompressed right colon today. Fecalith versus contrast containing appendix remains diminutive as seen on coronal images 51 through 58 and does not appear inflamed although there is regional mesenteric edema which appears dependent, generalized. No dilated small bowel. Stomach and duodenum appear negative. No free air. No free fluid. Vascular/Lymphatic: Diffuse severe calcified atherosclerosis. Aortoiliac calcified atherosclerosis. Normal caliber abdominal aorta. Major  arterial structures remain patent. Grossly patent main portal vein. No lymphadenopathy identified. Reproductive: Absent uterus.  Diminutive or absent ovaries. Other: No free fluid in the pelvis. Musculoskeletal: Lower lumbar advanced disc and endplate degeneration with vacuum disc. No acute osseous abnormality identified. Body wall edema, regressed along the left lateral body wall since April and similar elsewhere. IMPRESSION: 1. Anasarca with small layering pleural effusions and evidence of lung base pulmonary interstitial edema. Body wall edema and left pleural effusion both regressed compared to April. 2. Evidence of Cortlin Marano small acute on chronic Splenic Infarct. No perisplenic fluid or inflammation. No thrombus identified within the visible aorta but there is severe diffuse calcified atherosclerosis. Aortic Atherosclerosis (ICD10-I70.0). 3. Nonspecific circumferential rectal wall thickening. This most resembles Proctitis. Artifact due to under-distension or bowel edema due to #1 felt less likely. 4. Some generalized mesenteric edema suspected and related to #1. Some of this in the region of the appendix, but the appendix  appears to remain normal. 5. Cholelithiasis without CT evidence of acute cholecystitis. Hepatic steatosis. 6. Evidence of renal insufficiency. Electronically Signed   By: Odessa Fleming M.D.   On: 01/15/2023 07:50    Microbiology: No results found for this or any previous visit (from the past 240 hour(s)).   Labs: Basic Metabolic Panel: Recent Labs  Lab 01/29/23 0820 01/30/23 0659 01/31/23 0711 02/01/23 0701 02/02/23 0516  NA 127*  126* 133* 132* 134* 132*  K 4.0  4.0 3.9 3.7 3.5 3.7  CL 94*  93* 96* 95* 97* 93*  CO2 25  24 26 25 25 25   GLUCOSE 490*  490* 359* 247* 160* 106*  BUN 60*  60* 35* 45* 28* 43*  CREATININE 5.82*  5.74* 3.65* 4.30* 3.71* 5.03*  CALCIUM 8.8*  8.8* 9.6 9.5 9.3 9.4  MG 1.7 1.6*  --  1.7 1.7  PHOS 2.5  2.5 2.2* 2.0* 2.5 2.5   Liver Function Tests: Recent Labs  Lab 01/29/23 0820 01/30/23 0659 01/31/23 0711 02/01/23 0701  AST 14* 18  --  18  ALT 13 13  --  14  ALKPHOS 137* 165*  --  148*  BILITOT 0.4 0.7  --  0.3  PROT 5.8* 6.6  --  6.3*  ALBUMIN 2.9*  2.9* 3.1* 3.2* 3.0*   No results for input(s): "LIPASE", "AMYLASE" in the last 168 hours. No results for input(s): "AMMONIA" in the last 168 hours. CBC: Recent Labs  Lab 01/29/23 0820 01/30/23 0659 01/31/23 0711 02/01/23 0701 02/02/23 0516  WBC 4.5 6.1 6.8 7.5 5.8  NEUTROABS 3.2 5.1 5.4 4.8  --   HGB 9.1* 11.7* 12.1 11.4* 11.1*  HCT 30.8* 39.3 40.5 38.5 36.5  MCV 77.4* 77.4* 77.9* 79.1* 77.5*  PLT 166 211 238 270 207   Cardiac Enzymes: No results for input(s): "CKTOTAL", "CKMB", "CKMBINDEX", "TROPONINI" in the last 168 hours. BNP: BNP (last 3 results) Recent Labs    02/25/22 1051  BNP 1,142.6*    ProBNP (last 3 results) No results for input(s): "PROBNP" in the last 8760 hours.  CBG: Recent Labs  Lab 02/01/23 2050 02/02/23 0732 02/02/23 0754 02/02/23 0827 02/02/23 1118  GLUCAP 414* 79 84 93 192*       Signed:  Lacretia Nicks MD.  [facility] 02/02/2023,  12:37 PM

## 2023-02-02 NOTE — Telephone Encounter (Signed)
Pharmacy Patient Advocate Encounter   Received notification from Patient Pharmacy that prior authorization for Auryxia 1 GM210 MG(Fe) tablets is required/requested.   Insurance verification completed.   The patient is insured through [employer]/ [employer] .   Per test claim: PA required; PA submitted to [employer]/ [employer] via CoverMyMeds Key/confirmation #/EOC [id number] Status is pending

## 2023-02-03 ENCOUNTER — Telehealth: Payer: Self-pay | Admitting: Physician Assistant

## 2023-02-03 ENCOUNTER — Other Ambulatory Visit (HOSPITAL_COMMUNITY): Payer: Self-pay

## 2023-02-03 DIAGNOSIS — N186 End stage renal disease: Secondary | ICD-10-CM | POA: Diagnosis not present

## 2023-02-03 DIAGNOSIS — Z992 Dependence on renal dialysis: Secondary | ICD-10-CM | POA: Diagnosis not present

## 2023-02-03 DIAGNOSIS — E1122 Type 2 diabetes mellitus with diabetic chronic kidney disease: Secondary | ICD-10-CM | POA: Diagnosis not present

## 2023-02-03 DIAGNOSIS — N2581 Secondary hyperparathyroidism of renal origin: Secondary | ICD-10-CM | POA: Diagnosis not present

## 2023-02-03 DIAGNOSIS — D689 Coagulation defect, unspecified: Secondary | ICD-10-CM | POA: Diagnosis not present

## 2023-02-03 DIAGNOSIS — E039 Hypothyroidism, unspecified: Secondary | ICD-10-CM | POA: Diagnosis not present

## 2023-02-03 NOTE — Telephone Encounter (Signed)
Transition of Care - Initial Contact from Inpatient Facility  Date of discharge: 02/02/23 Date of contact: 02/03/23  Method: Phone Spoke to: Patient's husband, Susan [name]  Patient contacted to discuss transition of care from recent inpatient hospitalization. Patient was admitted to [facility] from 01/20/23-02/02/23 with discharge diagnosis of intractable nausea and vomiting. Also had recent hospitalization for PVD/recent right BKA.   The discharge medication list was reviewed. Patient's husband expresses understands the changes. Renvela was stopped and [place] was ordered but not approved. We will follow up at her outpatient dialysis unit to see if she qualifies for patient assistance.  Patient is already set up for home health, husband will call to follow up on changes after hospitalization. Discussed follow up appointments needed and he expresses understanding.   Patient will return to his/her outpatient HD unit on: 02/03/23 (there now)  No other concerns at this time.  Rogers Blocker, PA-C 02/03/2023, 2:39 PM  [employer]

## 2023-02-03 NOTE — Telephone Encounter (Signed)
Pharmacy Patient Advocate Encounter  Received notification from [employer]/ [employer] that Prior Authorization for Auryxia 1 GM210 MG(Fe) tablets  has been DENIED.  Full denial letter will be uploaded to the media tab. See denial reason below.   PA #/Case ID/Reference #: [id number]

## 2023-02-04 ENCOUNTER — Encounter (HOSPITAL_COMMUNITY): Payer: PPO | Admitting: Internal Medicine

## 2023-02-04 ENCOUNTER — Other Ambulatory Visit (HOSPITAL_COMMUNITY): Payer: Self-pay

## 2023-02-04 DIAGNOSIS — Z89511 Acquired absence of right leg below knee: Secondary | ICD-10-CM | POA: Diagnosis not present

## 2023-02-05 ENCOUNTER — Inpatient Hospital Stay (HOSPITAL_COMMUNITY)
Admission: EM | Admit: 2023-02-05 | Discharge: 2023-02-17 | DRG: 637 | Disposition: A | Discharge status: Home / Self Care | Payer: PPO | Attending: Internal Medicine | Admitting: Internal Medicine

## 2023-02-05 ENCOUNTER — Other Ambulatory Visit: Payer: Self-pay

## 2023-02-05 ENCOUNTER — Encounter (HOSPITAL_COMMUNITY): Payer: Self-pay

## 2023-02-05 DIAGNOSIS — E1122 Type 2 diabetes mellitus with diabetic chronic kidney disease: Secondary | ICD-10-CM | POA: Diagnosis not present

## 2023-02-05 DIAGNOSIS — R269 Unspecified abnormalities of gait and mobility: Secondary | ICD-10-CM | POA: Diagnosis not present

## 2023-02-05 DIAGNOSIS — K3184 Gastroparesis: Secondary | ICD-10-CM | POA: Insufficient documentation

## 2023-02-05 DIAGNOSIS — K8020 Calculus of gallbladder without cholecystitis without obstruction: Secondary | ICD-10-CM | POA: Diagnosis not present

## 2023-02-05 DIAGNOSIS — Z9842 Cataract extraction status, left eye: Secondary | ICD-10-CM

## 2023-02-05 DIAGNOSIS — Z7901 Long term (current) use of anticoagulants: Secondary | ICD-10-CM

## 2023-02-05 DIAGNOSIS — E1143 Type 2 diabetes mellitus with diabetic autonomic (poly)neuropathy: Secondary | ICD-10-CM | POA: Diagnosis present

## 2023-02-05 DIAGNOSIS — Z87891 Personal history of nicotine dependence: Secondary | ICD-10-CM

## 2023-02-05 DIAGNOSIS — N390 Urinary tract infection, site not specified: Secondary | ICD-10-CM

## 2023-02-05 DIAGNOSIS — E11649 Type 2 diabetes mellitus with hypoglycemia without coma: Secondary | ICD-10-CM | POA: Diagnosis not present

## 2023-02-05 DIAGNOSIS — E876 Hypokalemia: Secondary | ICD-10-CM | POA: Diagnosis present

## 2023-02-05 DIAGNOSIS — R739 Hyperglycemia, unspecified: Secondary | ICD-10-CM | POA: Diagnosis present

## 2023-02-05 DIAGNOSIS — E039 Hypothyroidism, unspecified: Secondary | ICD-10-CM | POA: Diagnosis not present

## 2023-02-05 DIAGNOSIS — I10 Essential (primary) hypertension: Secondary | ICD-10-CM | POA: Diagnosis not present

## 2023-02-05 DIAGNOSIS — N186 End stage renal disease: Secondary | ICD-10-CM | POA: Diagnosis not present

## 2023-02-05 DIAGNOSIS — Z8249 Family history of ischemic heart disease and other diseases of the circulatory system: Secondary | ICD-10-CM

## 2023-02-05 DIAGNOSIS — E1165 Type 2 diabetes mellitus with hyperglycemia: Secondary | ICD-10-CM | POA: Diagnosis not present

## 2023-02-05 DIAGNOSIS — Z8673 Personal history of transient ischemic attack (TIA), and cerebral infarction without residual deficits: Secondary | ICD-10-CM

## 2023-02-05 DIAGNOSIS — Z90710 Acquired absence of both cervix and uterus: Secondary | ICD-10-CM

## 2023-02-05 DIAGNOSIS — I5032 Chronic diastolic (congestive) heart failure: Secondary | ICD-10-CM | POA: Diagnosis not present

## 2023-02-05 DIAGNOSIS — I5020 Unspecified systolic (congestive) heart failure: Secondary | ICD-10-CM | POA: Diagnosis not present

## 2023-02-05 DIAGNOSIS — Z794 Long term (current) use of insulin: Secondary | ICD-10-CM

## 2023-02-05 DIAGNOSIS — E11319 Type 2 diabetes mellitus with unspecified diabetic retinopathy without macular edema: Secondary | ICD-10-CM | POA: Diagnosis not present

## 2023-02-05 DIAGNOSIS — I5022 Chronic systolic (congestive) heart failure: Secondary | ICD-10-CM | POA: Diagnosis not present

## 2023-02-05 DIAGNOSIS — S98131A Complete traumatic amputation of one right lesser toe, initial encounter: Secondary | ICD-10-CM | POA: Diagnosis not present

## 2023-02-05 DIAGNOSIS — I132 Hypertensive heart and chronic kidney disease with heart failure and with stage 5 chronic kidney disease, or end stage renal disease: Secondary | ICD-10-CM | POA: Diagnosis not present

## 2023-02-05 DIAGNOSIS — R609 Edema, unspecified: Secondary | ICD-10-CM | POA: Diagnosis not present

## 2023-02-05 DIAGNOSIS — I480 Paroxysmal atrial fibrillation: Secondary | ICD-10-CM | POA: Diagnosis present

## 2023-02-05 DIAGNOSIS — I5042 Chronic combined systolic (congestive) and diastolic (congestive) heart failure: Secondary | ICD-10-CM | POA: Diagnosis present

## 2023-02-05 DIAGNOSIS — R110 Nausea: Secondary | ICD-10-CM | POA: Diagnosis not present

## 2023-02-05 DIAGNOSIS — E1110 Type 2 diabetes mellitus with ketoacidosis without coma: Secondary | ICD-10-CM | POA: Diagnosis not present

## 2023-02-05 DIAGNOSIS — Z79890 Hormone replacement therapy (postmenopausal): Secondary | ICD-10-CM

## 2023-02-05 DIAGNOSIS — K5904 Chronic idiopathic constipation: Secondary | ICD-10-CM | POA: Diagnosis not present

## 2023-02-05 DIAGNOSIS — Z992 Dependence on renal dialysis: Secondary | ICD-10-CM

## 2023-02-05 DIAGNOSIS — E871 Hypo-osmolality and hyponatremia: Secondary | ICD-10-CM | POA: Diagnosis present

## 2023-02-05 DIAGNOSIS — Z885 Allergy status to narcotic agent status: Secondary | ICD-10-CM

## 2023-02-05 DIAGNOSIS — F419 Anxiety disorder, unspecified: Secondary | ICD-10-CM | POA: Diagnosis not present

## 2023-02-05 DIAGNOSIS — M898X9 Other specified disorders of bone, unspecified site: Secondary | ICD-10-CM | POA: Diagnosis present

## 2023-02-05 DIAGNOSIS — I120 Hypertensive chronic kidney disease with stage 5 chronic kidney disease or end stage renal disease: Secondary | ICD-10-CM | POA: Diagnosis not present

## 2023-02-05 DIAGNOSIS — I252 Old myocardial infarction: Secondary | ICD-10-CM

## 2023-02-05 DIAGNOSIS — Z79899 Other long term (current) drug therapy: Secondary | ICD-10-CM

## 2023-02-05 DIAGNOSIS — N2581 Secondary hyperparathyroidism of renal origin: Secondary | ICD-10-CM | POA: Diagnosis present

## 2023-02-05 DIAGNOSIS — D735 Infarction of spleen: Secondary | ICD-10-CM | POA: Diagnosis not present

## 2023-02-05 DIAGNOSIS — I2510 Atherosclerotic heart disease of native coronary artery without angina pectoris: Secondary | ICD-10-CM | POA: Diagnosis not present

## 2023-02-05 DIAGNOSIS — E785 Hyperlipidemia, unspecified: Secondary | ICD-10-CM | POA: Diagnosis not present

## 2023-02-05 DIAGNOSIS — Z955 Presence of coronary angioplasty implant and graft: Secondary | ICD-10-CM

## 2023-02-05 DIAGNOSIS — Z6829 Body mass index (BMI) 29.0-29.9, adult: Secondary | ICD-10-CM

## 2023-02-05 DIAGNOSIS — N250 Renal osteodystrophy: Secondary | ICD-10-CM | POA: Diagnosis not present

## 2023-02-05 DIAGNOSIS — E1100 Type 2 diabetes mellitus with hyperosmolarity without nonketotic hyperglycemic-hyperosmolar coma (NKHHC): Secondary | ICD-10-CM | POA: Diagnosis not present

## 2023-02-05 DIAGNOSIS — Z9841 Cataract extraction status, right eye: Secondary | ICD-10-CM

## 2023-02-05 DIAGNOSIS — Z89511 Acquired absence of right leg below knee: Secondary | ICD-10-CM

## 2023-02-05 DIAGNOSIS — E1151 Type 2 diabetes mellitus with diabetic peripheral angiopathy without gangrene: Secondary | ICD-10-CM | POA: Diagnosis not present

## 2023-02-05 DIAGNOSIS — F32A Depression, unspecified: Secondary | ICD-10-CM | POA: Diagnosis not present

## 2023-02-05 DIAGNOSIS — R062 Wheezing: Secondary | ICD-10-CM | POA: Diagnosis not present

## 2023-02-05 DIAGNOSIS — R112 Nausea with vomiting, unspecified: Secondary | ICD-10-CM | POA: Diagnosis not present

## 2023-02-05 DIAGNOSIS — Z87442 Personal history of urinary calculi: Secondary | ICD-10-CM

## 2023-02-05 DIAGNOSIS — Z881 Allergy status to other antibiotic agents status: Secondary | ICD-10-CM

## 2023-02-05 DIAGNOSIS — K219 Gastro-esophageal reflux disease without esophagitis: Secondary | ICD-10-CM | POA: Diagnosis present

## 2023-02-05 DIAGNOSIS — K5730 Diverticulosis of large intestine without perforation or abscess without bleeding: Secondary | ICD-10-CM | POA: Diagnosis not present

## 2023-02-05 DIAGNOSIS — M869 Osteomyelitis, unspecified: Secondary | ICD-10-CM | POA: Diagnosis not present

## 2023-02-05 DIAGNOSIS — E669 Obesity, unspecified: Secondary | ICD-10-CM | POA: Diagnosis present

## 2023-02-05 DIAGNOSIS — Z9181 History of falling: Secondary | ICD-10-CM

## 2023-02-05 DIAGNOSIS — D631 Anemia in chronic kidney disease: Secondary | ICD-10-CM | POA: Diagnosis present

## 2023-02-05 LAB — BASIC METABOLIC PANEL
Anion gap: 16 — ABNORMAL HIGH (ref 5–15)
Anion gap: 14 (ref 5–15)
BUN: 45 mg/dL — ABNORMAL HIGH (ref 8–23)
BUN: 49 mg/dL — ABNORMAL HIGH (ref 8–23)
CO2: 22 mmol/L (ref 22–32)
CO2: 22 mmol/L (ref 22–32)
Calcium: 9.5 mg/dL (ref 8.9–10.3)
Calcium: 9.2 mg/dL (ref 8.9–10.3)
Chloride: 89 mmol/L — ABNORMAL LOW (ref 98–111)
Chloride: 92 mmol/L — ABNORMAL LOW (ref 98–111)
Creatinine, Ser: 4.34 mg/dL — ABNORMAL HIGH (ref 0.44–1.00)
Creatinine, Ser: 4.39 mg/dL — ABNORMAL HIGH (ref 0.44–1.00)
GFR, Estimated: 10 mL/min — ABNORMAL LOW (ref >60)
GFR, Estimated: 10 mL/min — ABNORMAL LOW (ref >60)
Glucose, Bld: 593 mg/dL (ref 70–99)
Glucose, Bld: 228 mg/dL — ABNORMAL HIGH (ref 70–99)
Potassium: 4.1 mmol/L (ref 3.5–5.1)
Potassium: 3.4 mmol/L — ABNORMAL LOW (ref 3.5–5.1)
Sodium: 127 mmol/L — ABNORMAL LOW (ref 135–145)
Sodium: 128 mmol/L — ABNORMAL LOW (ref 135–145)

## 2023-02-05 LAB — CBC
HCT: 40.4 % (ref 36.0–46.0)
Hemoglobin: 11.7 g/dL — ABNORMAL LOW (ref 12.0–15.0)
MCH: 22.5 pg — ABNORMAL LOW (ref 26.0–34.0)
MCHC: 29 g/dL — ABNORMAL LOW (ref 30.0–36.0)
MCV: 77.7 fL — ABNORMAL LOW (ref 80.0–100.0)
Platelets: 237 10*3/uL (ref 150–400)
RBC: 5.2 MIL/uL — ABNORMAL HIGH (ref 3.87–5.11)
RDW: 20.5 % — ABNORMAL HIGH (ref 11.5–15.5)
WBC: 6.9 10*3/uL (ref 4.0–10.5)
nRBC: 0 % (ref 0.0–0.2)

## 2023-02-05 LAB — COMPREHENSIVE METABOLIC PANEL
ALT: 21 U/L (ref 0–44)
AST: 21 U/L (ref 15–41)
Albumin: 3.5 g/dL (ref 3.5–5.0)
Alkaline Phosphatase: 222 U/L — ABNORMAL HIGH (ref 38–126)
Anion gap: 15 (ref 5–15)
BUN: 48 mg/dL — ABNORMAL HIGH (ref 8–23)
CO2: 23 mmol/L (ref 22–32)
Calcium: 9.8 mg/dL (ref 8.9–10.3)
Chloride: 89 mmol/L — ABNORMAL LOW (ref 98–111)
Creatinine, Ser: 4.56 mg/dL — ABNORMAL HIGH (ref 0.44–1.00)
GFR, Estimated: 10 mL/min — ABNORMAL LOW (ref >60)
Glucose, Bld: 688 mg/dL (ref 70–99)
Potassium: 4.3 mmol/L (ref 3.5–5.1)
Sodium: 127 mmol/L — ABNORMAL LOW (ref 135–145)
Total Bilirubin: 0.7 mg/dL (ref 0.3–1.2)
Total Protein: 7.4 g/dL (ref 6.5–8.1)

## 2023-02-05 LAB — I-STAT VENOUS BLOOD GAS, ED
Acid-Base Excess: 0 mmol/L (ref 0.0–2.0)
Bicarbonate: 25.8 mmol/L (ref 20.0–28.0)
Calcium, Ion: 1.17 mmol/L (ref 1.15–1.40)
HCT: 41 % (ref 36.0–46.0)
Hemoglobin: 13.9 g/dL (ref 12.0–15.0)
O2 Saturation: 59 %
Potassium: 4.1 mmol/L (ref 3.5–5.1)
Sodium: 127 mmol/L — ABNORMAL LOW (ref 135–145)
TCO2: 27 mmol/L (ref 22–32)
pCO2, Ven: 44.7 mmHg (ref 44–60)
pH, Ven: 7.369 (ref 7.25–7.43)
pO2, Ven: 32 mmHg (ref 32–45)

## 2023-02-05 LAB — GLUCOSE, CAPILLARY
Glucose-Capillary: 125 mg/dL — ABNORMAL HIGH (ref 70–99)
Glucose-Capillary: 165 mg/dL — ABNORMAL HIGH (ref 70–99)
Glucose-Capillary: 180 mg/dL — ABNORMAL HIGH (ref 70–99)
Glucose-Capillary: 314 mg/dL — ABNORMAL HIGH (ref 70–99)
Glucose-Capillary: 394 mg/dL — ABNORMAL HIGH (ref 70–99)
Glucose-Capillary: 472 mg/dL — ABNORMAL HIGH (ref 70–99)

## 2023-02-05 LAB — URINALYSIS, ROUTINE W REFLEX MICROSCOPIC
Bilirubin Urine: NEGATIVE
Glucose, UA: >=500 mg/dL — AB
Ketones, ur: 5 mg/dL — AB
Nitrite: NEGATIVE
Protein, ur: 100 mg/dL — AB
Specific Gravity, Urine: 1.015 (ref 1.005–1.030)
WBC, UA: >50 WBC/hpf (ref 0–5)
pH: 6 (ref 5.0–8.0)

## 2023-02-05 LAB — CBG MONITORING, ED
Glucose-Capillary: >600 mg/dL (ref 70–99)
Glucose-Capillary: >600 mg/dL (ref 70–99)
Glucose-Capillary: 582 mg/dL (ref 70–99)

## 2023-02-05 LAB — LIPASE, BLOOD: Lipase: 23 U/L (ref 11–51)

## 2023-02-05 LAB — CREATININE, SERUM
Creatinine, Ser: 4.54 mg/dL — ABNORMAL HIGH (ref 0.44–1.00)
GFR, Estimated: 10 mL/min — ABNORMAL LOW (ref >60)

## 2023-02-05 LAB — BETA-HYDROXYBUTYRIC ACID
Beta-Hydroxybutyric Acid: 1.06 mmol/L — ABNORMAL HIGH (ref 0.05–0.27)
Beta-Hydroxybutyric Acid: 0.06 mmol/L (ref 0.05–0.27)

## 2023-02-05 LAB — PHOSPHORUS: Phosphorus: 3.3 mg/dL (ref 2.5–4.6)

## 2023-02-05 LAB — OSMOLALITY: Osmolality: 332 mosm/kg (ref 275–295)

## 2023-02-05 MED ORDER — CHLORHEXIDINE GLUCONATE CLOTH 2 % EX PADS
6.0000 | MEDICATED_PAD | Freq: Every day | CUTANEOUS | Status: DC
  Administered 2023-02-06 – 2023-02-17 (×12): 6 via TOPICAL

## 2023-02-05 MED ORDER — LACTATED RINGERS IV SOLN: INTRAVENOUS | Status: DC

## 2023-02-05 MED ORDER — INSULIN GLARGINE-YFGN 100 UNIT/ML ~~LOC~~ SOLN
26.0000 [IU] | SUBCUTANEOUS | Status: DC
  Administered 2023-02-05: 26 [IU] via SUBCUTANEOUS
  Filled 2023-02-05: qty 0.26

## 2023-02-05 MED ORDER — ATORVASTATIN CALCIUM 80 MG PO TABS
80.0000 mg | ORAL_TABLET | Freq: Every day | ORAL | Status: DC
  Administered 2023-02-05 – 2023-02-17 (×9): 80 mg via ORAL
  Filled 2023-02-05: qty 1
  Filled 2023-02-05: qty 2
  Filled 2023-02-05 (×7): qty 1
  Filled 2023-02-05: qty 2

## 2023-02-05 MED ORDER — ONDANSETRON HCL 4 MG/2ML IJ SOLN
4.0000 mg | Freq: Four times a day (QID) | INTRAMUSCULAR | Status: DC | PRN
  Administered 2023-02-06 – 2023-02-09 (×5): 4 mg via INTRAVENOUS
  Filled 2023-02-05 (×4): qty 2

## 2023-02-05 MED ORDER — DEXTROSE IN LACTATED RINGERS 5 % IV SOLN: INTRAVENOUS | Status: DC

## 2023-02-05 MED ORDER — METHOCARBAMOL 500 MG PO TABS: 750.0000 mg | ORAL_TABLET | Freq: Four times a day (QID) | ORAL | Status: DC | PRN

## 2023-02-05 MED ORDER — ACETAMINOPHEN 325 MG PO TABS
650.0000 mg | ORAL_TABLET | Freq: Four times a day (QID) | ORAL | Status: DC | PRN
  Administered 2023-02-06: 650 mg via ORAL
  Filled 2023-02-05: qty 2

## 2023-02-05 MED ORDER — FERRIC CITRATE 1 GM 210 MG(FE) PO TABS
420.0000 mg | ORAL_TABLET | Freq: Three times a day (TID) | ORAL | Status: DC
  Administered 2023-02-06 (×3): 420 mg via ORAL
  Filled 2023-02-05 (×4): qty 2

## 2023-02-05 MED ORDER — INSULIN REGULAR(HUMAN) IN NACL 100-0.9 UT/100ML-% IV SOLN
INTRAVENOUS | Status: DC
  Administered 2023-02-05: 10.5 [IU]/h via INTRAVENOUS
  Filled 2023-02-05: qty 100

## 2023-02-05 MED ORDER — INSULIN ASPART 100 UNIT/ML IJ SOLN
2.0000 [IU] | Freq: Three times a day (TID) | INTRAMUSCULAR | Status: DC
  Administered 2023-02-08 – 2023-02-13 (×10): 2 [IU] via SUBCUTANEOUS

## 2023-02-05 MED ORDER — VENLAFAXINE HCL ER 75 MG PO CP24
75.0000 mg | ORAL_CAPSULE | Freq: Every day | ORAL | Status: DC
  Administered 2023-02-06 – 2023-02-17 (×7): 75 mg via ORAL
  Filled 2023-02-05 (×8): qty 1

## 2023-02-05 MED ORDER — SODIUM CHLORIDE 0.9 % IV SOLN
2.0000 g | Freq: Once | INTRAVENOUS | Status: AC
  Administered 2023-02-05: 2 g via INTRAVENOUS
  Filled 2023-02-05: qty 12.5

## 2023-02-05 MED ORDER — ACETAMINOPHEN 650 MG RE SUPP: 650.0000 mg | Freq: Four times a day (QID) | RECTAL | Status: DC | PRN

## 2023-02-05 MED ORDER — AMIODARONE HCL 200 MG PO TABS
200.0000 mg | ORAL_TABLET | Freq: Every day | ORAL | Status: DC
  Administered 2023-02-05 – 2023-02-17 (×11): 200 mg via ORAL
  Filled 2023-02-05 (×11): qty 1

## 2023-02-05 MED ORDER — LEVOTHYROXINE SODIUM 75 MCG PO TABS
75.0000 ug | ORAL_TABLET | Freq: Every day | ORAL | Status: DC
  Administered 2023-02-06 – 2023-02-17 (×11): 75 ug via ORAL
  Filled 2023-02-05 (×13): qty 1

## 2023-02-05 MED ORDER — SODIUM CHLORIDE 0.9 % IV SOLN
1.0000 g | INTRAVENOUS | Status: DC
  Filled 2023-02-05: qty 10

## 2023-02-05 MED ORDER — CARVEDILOL 6.25 MG PO TABS
6.2500 mg | ORAL_TABLET | Freq: Two times a day (BID) | ORAL | Status: DC
  Administered 2023-02-05 – 2023-02-13 (×11): 6.25 mg via ORAL
  Filled 2023-02-05: qty 2
  Filled 2023-02-05: qty 1
  Filled 2023-02-05: qty 2
  Filled 2023-02-05 (×9): qty 1

## 2023-02-05 MED ORDER — APIXABAN 5 MG PO TABS
5.0000 mg | ORAL_TABLET | Freq: Two times a day (BID) | ORAL | Status: DC
  Administered 2023-02-05 – 2023-02-11 (×9): 5 mg via ORAL
  Filled 2023-02-05 (×11): qty 1

## 2023-02-05 MED ORDER — INSULIN ASPART 100 UNIT/ML IJ SOLN
0.0000 [IU] | Freq: Three times a day (TID) | INTRAMUSCULAR | Status: DC
  Administered 2023-02-06: 1 [IU] via SUBCUTANEOUS

## 2023-02-05 MED ORDER — OXYCODONE HCL 5 MG PO TABS: 5.0000 mg | ORAL_TABLET | ORAL | Status: DC | PRN

## 2023-02-05 MED ORDER — DEXTROSE 50 % IV SOLN: 0.0000 mL | INTRAVENOUS | Status: DC | PRN

## 2023-02-05 MED ORDER — ONDANSETRON HCL 4 MG PO TABS: 4.0000 mg | ORAL_TABLET | Freq: Four times a day (QID) | ORAL | Status: DC | PRN

## 2023-02-05 MED ORDER — CLONAZEPAM 0.25 MG PO TBDP
0.5000 mg | ORAL_TABLET | Freq: Two times a day (BID) | ORAL | Status: DC
  Administered 2023-02-05 – 2023-02-17 (×17): 0.5 mg via ORAL
  Filled 2023-02-05 (×20): qty 2

## 2023-02-05 MED ORDER — MIRTAZAPINE 15 MG PO TABS
7.5000 mg | ORAL_TABLET | Freq: Every day | ORAL | Status: DC
  Administered 2023-02-05 – 2023-02-17 (×9): 7.5 mg via ORAL
  Filled 2023-02-05 (×11): qty 1

## 2023-02-05 MED ORDER — LACTATED RINGERS IV BOLUS
1000.0000 mL | INTRAVENOUS | Status: AC
  Administered 2023-02-05: 1000 mL via INTRAVENOUS

## 2023-02-05 MED ORDER — POTASSIUM CHLORIDE 10 MEQ/100ML IV SOLN
10.0000 meq | INTRAVENOUS | Status: AC
  Administered 2023-02-05 (×2): 10 meq via INTRAVENOUS
  Filled 2023-02-05 (×2): qty 100

## 2023-02-05 MED ORDER — MIDODRINE HCL 5 MG PO TABS
10.0000 mg | ORAL_TABLET | Freq: Every day | ORAL | Status: DC | PRN
  Administered 2023-02-06 – 2023-02-15 (×6): 10 mg via ORAL
  Filled 2023-02-05 (×6): qty 2

## 2023-02-05 MED ORDER — CALCITRIOL 0.25 MCG PO CAPS
0.7500 ug | ORAL_CAPSULE | ORAL | Status: DC
  Administered 2023-02-07 – 2023-02-17 (×5): 0.75 ug via ORAL
  Filled 2023-02-05 (×8): qty 3

## 2023-02-05 MED ORDER — PANTOPRAZOLE SODIUM 40 MG PO TBEC
40.0000 mg | DELAYED_RELEASE_TABLET | Freq: Two times a day (BID) | ORAL | Status: DC
  Administered 2023-02-05 – 2023-02-06 (×3): 40 mg via ORAL
  Filled 2023-02-05 (×3): qty 1

## 2023-02-05 NOTE — Progress Notes (Signed)
   02/05/23 1912  Wound / Incision (Open or Dehisced) 11/10/22 Diabetic ulcer Pretibial Left;Lateral;Lower  Date First Assessed/Time First Assessed: 11/10/22 2300   Wound Type: Diabetic ulcer  Location: Pretibial  Location Orientation: Left;Lateral;Lower  Present on Admission: Yes  Dressing Type Gauze (Comment) (Gauze dsg from home)  Dressing Changed Changed  Dressing Status Intact  Site / Wound Assessment Red  % Wound base Red or Granulating 100%  Peri-wound Assessment Intact  Wound Length (cm) 1.75 cm  Wound Width (cm) 1.2 cm  Wound Surface Area (cm^2) 2.1 cm^2  Margins Attached edges (approximated)  Drainage Amount Minimal  Drainage Description Serosanguineous  Treatment Other (Comment) (dsg changed)

## 2023-02-05 NOTE — H&P (Signed)
History and Physical    Susan [name] LOV:[id number] DOB: 02-21-1951 DOA: 02/05/2023  PCP: Donita Brooks, MD   Chief Complaint: n/v  HPI: Susan [name] is a 72 y.o. female with medical history significant of ESRD on hemodialysis, CAD, CHF, hyperlipidemia, depression, hypothyroidism, obesity, prior CVA who presented to the emergency department due to nausea vomiting poor p.o. intake.  Patient states that she has been unable to keep food down over the last several days.  She recently was discharged on 9/9 after being admitted for nausea vomiting found to have hyperglycemia.  During this hospitalization her sugars were corrected and her symptoms improved.  She was eventually discharged with outpatient follow-up.  On this presentation to the ER she was afebrile hemodynamically stable.  Labs were obtained which revealed sodium 127, glucose 688, creatinine 4.6, WBC 6.9, hemoglobin 11.7, osmolality 332, beta hydroxybutyrate 1.06, pH 7.37, urinalysis possible infection.  Due to her profound hypoglycemia she was started on insulin drip and admitted for further workup.  Nephrology was consulted with plans for dialysis in the morning.  Due to her scant urinary output will not be aggressive with IV fluids.   Review of Systems: Review of Systems  Constitutional: Negative.   HENT: Negative.    Eyes: Negative.   Respiratory: Negative.    Cardiovascular: Negative.   Gastrointestinal: Negative.   Genitourinary: Negative.   Musculoskeletal: Negative.   Skin: Negative.   Neurological: Negative.   Endo/Heme/Allergies: Negative.   Psychiatric/Behavioral: Negative.    All other systems reviewed and are negative.    As per HPI otherwise 10 point review of systems negative.   Allergies  Allergen Reactions   Cephalexin Diarrhea and Other (See Comments)   Codeine Nausea And Vomiting and Other (See Comments)    Past Medical History:  Diagnosis Date   Anemia    hx   Anxiety    Arthritis     "generalized" (03/15/2014)   CAD (coronary artery disease)    MI in 2000 - MI  2007 - treated bare metal stent (no nuclear since then as 9/11)   Carotid artery disease (HCC)    Chronic diastolic heart failure (HCC)    a) ECHO (08/2013) EF 55-60% and RV function nl b) RHC (08/2013) RA 4, RV 30/5/7, PA 25/10 (16), PCWP 7, Fick CO/CI 6.3/2.7, PVR 1.5 WU, PA 61 and 66%   Daily headache    "~ every other day; since I fell in June" (03/15/2014)   Depression    Diabetic retinopathy (HCC)    Dyslipidemia    ESRD (end stage renal disease) (HCC)    Dialysis on Tues Thurs Sat   Exertional shortness of breath    History of kidney stones    HTN (hypertension)    Hypothyroidism    Obesity    Osteoarthritis    PAF (paroxysmal atrial fibrillation) (HCC)    Peripheral neuropathy    bilateral feet/hands   PONV (postoperative nausea and vomiting)    RBBB (right bundle branch block)    Old   Stroke (HCC)    mini strokes   Type II diabetes mellitus (HCC)    Type II, [name]. patient has omnipod insulin pump with Novolin R Insulin    Past Surgical History:  Procedure Laterality Date   A/V FISTULAGRAM Left 11/07/2022   Procedure: A/V Fistulagram;  Surgeon: Leonie Douglas, MD;  [facility];  Service: Cardiovascular;  Laterality: Left;   ABDOMINAL HYSTERECTOMY  1980's  AMPUTATION Right 02/24/2018   Procedure: RIGHT FOOT GREAT TOE AND 2ND TOE AMPUTATION;  Surgeon: Nadara Mustard, MD;  Location: [facility] OR;  Service: Orthopedics;  Laterality: Right;   AMPUTATION Right 04/30/2018   Procedure: RIGHT TRANSMETATARSAL AMPUTATION;  Surgeon: Nadara Mustard, MD;  Location: [facility] OR;  Service: Orthopedics;  Laterality: Right;   AMPUTATION Right 05/02/2022   Procedure: RIGHT BELOW KNEE AMPUTATION;  Surgeon: Nadara Mustard, MD;  Location: [facility] OR;  Service: Orthopedics;  Laterality: Right;   APPLICATION OF WOUND VAC Right 06/13/2022   Procedure: APPLICATION OF WOUND VAC;  Surgeon: Nadara Mustard, MD;  [facility]: [facility] OR;  Service: Orthopedics;  Laterality: Right;   APPLICATION OF WOUND VAC Left 11/14/2022   Procedure: APPLICATION OF WOUND VAC;  Surgeon: Nadara Mustard, MD;  Location: [facility] OR;  Service: Orthopedics;  Laterality: Left;   AV FISTULA PLACEMENT Left 04/02/2022   Procedure: LEFT ARM ARTERIOVENOUS (AV) FISTULA CREATION;  Surgeon: Nada Libman, MD;  Location: [facility] OR;  Service: Vascular;  Laterality: Left;  PERIPHERAL NERVE BLOCK   BASCILIC VEIN TRANSPOSITION Left 07/31/2022   Procedure: LEFT ARM SECOND STAGE BASILIC VEIN TRANSPOSITION;  Surgeon: Nada Libman, MD;  [facility]: [facility] OR;  Service: Vascular;  Laterality: Left;   BIOPSY  05/27/2020   Procedure: BIOPSY;  Surgeon: Lanelle Bal, DO;  [facility]: [facility];  Service: Endoscopy;;   CATARACT EXTRACTION, BILATERAL Bilateral ?2013   COLONOSCOPY W/ POLYPECTOMY     COLONOSCOPY WITH PROPOFOL N/A 03/13/2019   Procedure: COLONOSCOPY WITH PROPOFOL;  Surgeon: Beverley Fiedler, MD;  Location: [facility] ENDOSCOPY;  Service: Gastroenterology;  Laterality: N/A;   CORONARY ANGIOPLASTY WITH STENT PLACEMENT  1999; 2007   "1 + 1"   ERCP N/A 02/03/2022   Procedure: ENDOSCOPIC RETROGRADE CHOLANGIOPANCREATOGRAPHY (ERCP);  Surgeon: Jeani Hawking, MD;  Location: [facility] ENDOSCOPY;  Service: Gastroenterology;  Laterality: N/A;   ESOPHAGOGASTRODUODENOSCOPY (EGD) WITH PROPOFOL N/A 03/13/2019   Procedure: ESOPHAGOGASTRODUODENOSCOPY (EGD) WITH PROPOFOL;  Surgeon: Beverley Fiedler, MD;  Location: [facility] ENDOSCOPY;  Service: Gastroenterology;  Laterality: N/A;   ESOPHAGOGASTRODUODENOSCOPY (EGD) WITH PROPOFOL N/A 05/27/2020   Procedure: ESOPHAGOGASTRODUODENOSCOPY (EGD) WITH PROPOFOL;  Surgeon: Lanelle Bal, DO;  [facility];  Service: Endoscopy;  Laterality: N/A;   ESOPHAGOGASTRODUODENOSCOPY (EGD) WITH PROPOFOL N/A 09/03/2022   Procedure: ESOPHAGOGASTRODUODENOSCOPY (EGD) WITH PROPOFOL;  Surgeon: Jeani Hawking, MD;  Location: [facility] ENDOSCOPY;  Service:  Gastroenterology;  Laterality: N/A;   EYE SURGERY Bilateral    lazer   FLEXIBLE SIGMOIDOSCOPY N/A 05/23/2022   Procedure: FLEXIBLE SIGMOIDOSCOPY;  Surgeon: Jeani Hawking, MD;  Location: [facility] ENDOSCOPY;  Service: Gastroenterology;  Laterality: N/A;   FLEXIBLE SIGMOIDOSCOPY N/A 05/24/2022   Procedure: FLEXIBLE SIGMOIDOSCOPY;  Surgeon: Imogene Burn, MD;  Location: [facility] ENDOSCOPY;  Service: Gastroenterology;  Laterality: N/A;   FLEXIBLE SIGMOIDOSCOPY N/A 09/03/2022   Procedure: FLEXIBLE SIGMOIDOSCOPY;  Surgeon: Jeani Hawking, MD;  Location: [facility] ENDOSCOPY;  Service: Gastroenterology;  Laterality: N/A;   HEMOSTASIS CLIP PLACEMENT  03/13/2019   Procedure: HEMOSTASIS CLIP PLACEMENT;  Surgeon: Beverley Fiedler, MD;  Location: [facility] ENDOSCOPY;  Service: Gastroenterology;;   HEMOSTASIS CLIP PLACEMENT  05/23/2022   Procedure: HEMOSTASIS CLIP PLACEMENT;  Surgeon: Jeani Hawking, MD;  Location: [facility] ENDOSCOPY;  Service: Gastroenterology;;   HEMOSTASIS CONTROL  05/24/2022   Procedure: HEMOSTASIS CONTROL;  Surgeon: Imogene Burn, MD;  Location: [facility] ENDOSCOPY;  Service: Gastroenterology;;   HOT HEMOSTASIS N/A 05/23/2022   Procedure: HOT HEMOSTASIS (ARGON PLASMA COAGULATION/BICAP);  Surgeon: Jeani Hawking,  MD;  [facility]OS[facility];  Service: Gastroenterology;  Laterality: N/A;   I & D EXTREMITY Left 05/05/2022   Procedure: IRRIGATION AND DEBRIDEMENT LEFT ARM AV FISTULA;  Surgeon: Cephus Shelling, MD;  Location: [facility] OR;  Service: Vascular;  Laterality: Left;   I & D EXTREMITY N/A 11/14/2022   Procedure: IRRIGATION AND DEBRIDEMENT OF LOWER EXTREMITY WOUND;  Surgeon: Nadara Mustard, MD;  Location: [facility] OR;  Service: Orthopedics;  Laterality: N/A;   INSERTION OF DIALYSIS CATHETER Right 04/02/2022   Procedure: INSERTION OF TUNNELED DIALYSIS CATHETER;  Surgeon: Nada Libman, MD;  Location: [facility] OR;  Service: Vascular;  Laterality: Right;   KNEE ARTHROSCOPY Left 10/25/2006   POLYPECTOMY  03/13/2019   Procedure: POLYPECTOMY;   Surgeon: Beverley Fiedler, MD;  [facility] ENDOS[facility];  Service: Gastroenterology;;   REMOVAL OF STONES  02/03/2022   Procedure: REMOVAL OF STONES;  Surgeon: Jeani Hawking, MD;  Location: [facility] ENDOSCOPY;  Service: Gastroenterology;;   REVISON OF ARTERIOVENOUS FISTULA Left 08/20/2022   Procedure: REVISON OF LEFT ARM ARTERIOVENOUS FISTULA;  Surgeon: Nada Libman, MD;  [facility] OR;  Service: Vascular;  Laterality: Left;   RIGHT HEART CATH N/A 07/24/2017   Procedure: RIGHT HEART CATH;  Surgeon: Dolores Patty, MD;  [facility];  Service: Cardiovascular;  Laterality: N/A;   RIGHT HEART CATHETERIZATION N/A 09/22/2013   Procedure: RIGHT HEART CATH;  Surgeon: Dolores Patty, MD;  Location: [facility] CATH LAB;  Service: Cardiovascular;  Laterality: N/A;   SHOULDER ARTHROSCOPY WITH OPEN ROTATOR CUFF REPAIR Right 03/14/2014   Procedure: RIGHT SHOULDER ARTHROSCOPY WITH BICEPS RELEASE, OPEN SUBSCAPULA REPAIR, OPEN SUPRASPINATUS REPAIR.;  Surgeon: Cammy Copa, MD;  Location: [facility] OR;  Service: Orthopedics;  Laterality: Right;   SPHINCTEROTOMY  02/03/2022   Procedure: SPHINCTEROTOMY;  Surgeon: Jeani Hawking, MD;  Location: [facility] ENDOSCOPY;  Service: Gastroenterology;;   STUMP REVISION Right 06/13/2022   Procedure: REVISION RIGHT BELOW KNEE AMPUTATION;  Surgeon: Nadara Mustard, MD;  Location: [facility] OR;  Service: Orthopedics;  Laterality: Right;   TEE WITHOUT CARDIOVERSION N/A 02/04/2022   Procedure: TRANSESOPHAGEAL ECHOCARDIOGRAM (TEE);  Surgeon: Dolores Patty, MD;  Location: [facility] ENDOSCOPY;  Service: Cardiovascular;  Laterality: N/A;   THROMBECTOMY W/ EMBOLECTOMY Left 08/20/2022   Procedure: THROMBECTOMY OF LEFT ARM ARTERIOVENOUS FISTULA;  Surgeon: Nada Libman, MD;  Location[facility] OR;  Service: Vascular;  Laterality: Left;   TOE AMPUTATION Right 02/24/2018   GREAT TOE AND 2ND TOE AMPUTATION   TUBAL LIGATION  1970's     reports that she quit smoking about 25 years ago. Her smoking use  included cigarettes. She started smoking about 57 years ago. She has a 96 pack-year smoking history. She has never used smokeless tobacco. She reports that she does not currently use alcohol. She reports that she does not use drugs.  Family History  Problem Relation Age of Onset   Heart attack Mother 28    Prior to Admission medications   Medication Sig Start Date End Date Taking? Authorizing Provider  acetaminophen (TYLENOL) 325 MG tablet Take 2 tablets (650 mg total) by mouth every 6 (six) hours as needed for mild pain (or Fever >/= 101). 12/10/22  Yes Angiulli, Mcarthur Rossetti, PA-C  amiodarone (PACERONE) 200 MG tablet Take 1 tablet (200 mg total) by mouth daily. 12/11/22  Yes Angiulli, Mcarthur Rossetti, PA-C  apixaban (ELIQUIS) 5 MG TABS tablet Take 1 tablet (5 mg total) by mouth 2 (two) times daily. 12/11/22  Yes  Angiulli, Mcarthur Rossetti, PA-C  ascorbic acid (VITAMIN C) 500 MG tablet Take 1 tablet (500 mg total) by mouth daily. 12/11/22  Yes Angiulli, Mcarthur Rossetti, PA-C  atorvastatin (LIPITOR) 80 MG tablet TAKE 1 TABLET BY MOUTH EVERY DAY 12/26/22  Yes Bensimhon, Bevelyn Buckles, MD  carvedilol (COREG) 6.25 MG tablet Take 6.25 mg by mouth 2 (two) times daily with a meal. 08/11/22  Yes [provider]  clonazePAM (KLONOPIN) 0.5 MG disintegrating tablet Take 1 tablet (0.5 mg total) by mouth 2 (two) times daily. 01/19/23  Yes Donita Brooks, MD  ferric citrate (AURYXIA) 1 GM 210 MG(Fe) tablet Take 2 tablets (420 mg total) by mouth 3 (three) times daily with meals. 02/02/23 03/04/23 Yes Zigmund Daniel., MD  folic acid (FOLVITE) 1 MG tablet Take 1 tablet (1 mg total) by mouth daily. 02/02/23 04/03/23 Yes Zigmund Daniel., MD  insulin aspart (NOVOLOG) 100 UNIT/ML injection Inject 0-15 Units into the skin 3 (three) times daily with meals. CBG < 70: treat low blood sugar CBG 70 - 120: 0 units CBG 121 - 150: 2 units CBG 151 - 200: 3 units CBG 201 - 250: 5 units CBG 251 - 300: 8 units CBG 301 - 350: 11 units CBG 351  - 400: 15 units CBG > 400: call MD 02/02/23  Yes Zigmund Daniel., MD  insulin glargine (LANTUS) 100 UNIT/ML Solostar Pen Inject 26 Units into the skin daily. 02/02/23  Yes Zigmund Daniel., MD  levothyroxine (SYNTHROID) 75 MCG tablet Take 1 tablet (75 mcg total) by mouth daily before breakfast. 12/11/22  Yes Angiulli, Mcarthur Rossetti, PA-C  LORazepam (ATIVAN) 0.5 MG tablet Take 0.5 mg by mouth at bedtime.   Yes [provider]  melatonin 3 MG TABS tablet Take 1 tablet (3 mg total) by mouth at bedtime. 02/02/23 03/04/23 Yes Zigmund Daniel., MD  methocarbamol (ROBAXIN) 750 MG tablet Take 1 tablet (750 mg total) by mouth every 6 (six) hours as needed for muscle spasms. 12/11/22  Yes Angiulli, Mcarthur Rossetti, PA-C  midodrine (PROAMATINE) 10 MG tablet Take 1 tablet (10 mg total) by mouth Every Tuesday,Thursday,and Saturday with dialysis. 12/11/22  Yes Angiulli, Mcarthur Rossetti, PA-C  mirtazapine (REMERON) 7.5 MG tablet Take 1 tablet (7.5 mg total) by mouth at bedtime. 02/02/23 03/04/23 Yes Zigmund Daniel., MD  nystatin (MYCOSTATIN) 100000 UNIT/ML suspension Take 5 mLs (500,000 Units total) by mouth 4 (four) times daily for 4 days. 02/02/23 02/06/23 Yes Zigmund Daniel., MD  pantoprazole (PROTONIX) 40 MG tablet Take 1 tablet (40 mg total) by mouth 2 (two) times daily before a meal. 01/09/23 01/09/24 Yes [name], Priscille Heidelberg, MD  polyethylene glycol (MIRALAX / GLYCOLAX) 17 g packet Take 17 g by mouth daily. Patient taking differently: Take 17 g by mouth See admin instructions. Every other day 12/10/22  Yes Angiulli, Mcarthur Rossetti, PA-C  prochlorperazine (COMPAZINE) 5 MG tablet Take 1 tablet (5 mg total) by mouth every 8 (eight) hours as needed for up to 5 days for nausea or vomiting. 02/02/23 02/07/23 Yes Zigmund Daniel., MD  venlafaxine XR (EFFEXOR-XR) 75 MG 24 hr capsule Take 1 capsule (75 mg total) by mouth daily with breakfast. Stop trintellix 01/05/23  Yes Donita Brooks, MD  Zinc Sulfate 220 (50 Zn)  MG TABS Take 1 tablet (220 mg total) by mouth daily. 12/11/22  Yes Charlton Amor, PA-C    Physical Exam: Vitals:   02/05/23 1715 02/05/23  1800 02/05/23 1830 02/05/23 1921  BP:  (!) 180/97 (!) 161/79 (!) 163/77  Pulse:  92 86 86  Resp:  11 14 17   Temp: 97.9 F (36.6 C)     TempSrc: Oral     SpO2:  100% 100% 100%  Weight:      Height:       Physical Exam Vitals reviewed.  Constitutional:      Appearance: She is normal weight.  HENT:     Head: Normocephalic.     Right Ear: Tympanic membrane normal.     Nose: Nose normal.     Mouth/Throat:     Mouth: Mucous membranes are moist.  Eyes:     Pupils: Pupils are equal, round, and reactive to light.  Cardiovascular:     Rate and Rhythm: Normal rate.  Pulmonary:     Effort: Pulmonary effort is normal.  Abdominal:     General: Abdomen is flat. Bowel sounds are normal.  Musculoskeletal:        General: Normal range of motion.     Cervical back: Normal range of motion.  Skin:    General: Skin is warm.     Capillary Refill: Capillary refill takes less than 2 seconds.  Neurological:     General: No focal deficit present.     Mental Status: She is alert.        Labs on Admission: I have personally reviewed the patients's labs and imaging studies.  Assessment/Plan Principal Problem:   Hyperosmolar hyperglycemic state (HHS) (HCC)   # HHS - Patient has profound hyperglycemia in setting of nausea and vomiting - Endorses adherence to insulin regimen at home - No acidosis to suggest DKA  Plan: Placed on insulin drip/Endo tool Conservative IV fluid resuscitate him due to dialysis  # ESRD hemodialysis-nephrology consulted  # Urinary tract infection-continue cefepime  # Intractable nausea and vomiting-patient has recent colitis.  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  Will plan to correct hyperglycemia and determine at that point whether patient  needs further workup of her symptoms  # Paroxysmal A-fib-continue home Coreg, Eliquis, amiodarone  # CAD-status post stent.  Will continue to monitor  # Hypertension-continue Coreg  # Anemia of chronic disease-trend hemoglobin  # Hypothyroidism-continue Synthroid  # Depression-continue venlafaxine, Klonopin, Remeron  # Peripheral vascular disease status post right BKA-continue to monitor   Admission status: Inpatient Progressive  Certification: The appropriate patient status for this patient is INPATIENT. Inpatient status is judged to be reasonable and necessary in order to provide the required intensity of service to ensure the patient's safety. The patient's presenting symptoms, physical exam findings, and initial radiographic and laboratory data in the context of their chronic comorbidities is felt to place them at high risk for further clinical deterioration. Furthermore, it is not anticipated that the patient will be medically stable for discharge from the hospital within 2 midnights of admission.   * I certify that at the point of admission it is my clinical judgment that the patient will require inpatient hospital care spanning beyond 2 midnights from the point of admission due to high intensity of service, high risk for further deterioration and high frequency of surveillance required.Alan Mulder MD [facility] If 7PM-7AM, please contact night-coverage [email or web address]  02/05/2023, 8:26 PM

## 2023-02-05 NOTE — Progress Notes (Signed)
[facility] Progress Note   Subjective:   Patient Susan [name] presented today with nausea and vomiting at home for several days that worsened since discharge.  She also has had some shortness of breath today she relates.  Nausea and vomiting a little bit better now when I talked to her.  Denies fevers or chills.  Last had dialysis on Tuesday  Objective Vitals:   02/05/23 1715 02/05/23 1800 02/05/23 1830 02/05/23 1921  BP:  (!) 180/97 (!) 161/79 (!) 163/77  Pulse:  92 86 86  Resp:  11 14 17   Temp: 97.9 F (36.6 C)     TempSrc: Oral     SpO2:  100% 100% 100%  Weight:      Height:       Physical Exam General: Chronically ill appearing female, NAD Heart: normal rate, no murmurs Lungs: Clear bilaterally, normal WOB Abdomen: Soft, non-distended, +BS Extremities: no Left [name], R BKA Skin: no obvious rashes or jaundice Psych: appropriate mood and affect Neuro: no obvious deficits, alert and oriented Dialysis Access:  [facility]  Additional Objective Labs: Basic Metabolic Panel: Recent Labs  Lab 01/31/23 0711 02/01/23 0701 02/02/23 0516 02/05/23 1114 02/05/23 1342 02/05/23 1351 02/05/23 1543  NA 132* 134* 132* 127*  --  127* 127*  K 3.7 3.5 3.7 4.3  --  4.1 4.1  CL 95* 97* 93* 89*  --   --  89*  CO2 25 25 25 23   --   --  22  GLUCOSE 247* 160* 106* 688*  --   --  593*  BUN 45* 28* 43* 48*  --   --  45*  CREATININE 4.30* 3.71* 5.03* 4.56* 4.54*  --  4.34*  CALCIUM 9.5 9.3 9.4 9.8  --   --  9.5  PHOS 2.0* 2.5 2.5  --   --   --   --    Liver Function Tests: Recent Labs  Lab 01/30/23 0659 01/31/23 0711 02/01/23 0701 02/05/23 1114  AST 18  --  18 21  ALT 13  --  14 21  ALKPHOS 165*  --  148* 222*  BILITOT 0.7  --  0.3 0.7  PROT 6.6  --  6.3* 7.4  ALBUMIN 3.1* 3.2* 3.0* 3.5   Recent Labs  Lab 02/05/23 1114  LIPASE 23   CBC: Recent Labs  Lab 01/30/23 0659 01/31/23 0711 02/01/23 0701 02/02/23 0516 02/05/23 1114 02/05/23 1351  WBC 6.1 6.8 7.5 5.8 6.9  --    NEUTROABS 5.1 5.4 4.8  --   --   --   HGB 11.7* 12.1 11.4* 11.1* 11.7* 13.9  HCT 39.3 40.5 38.5 36.5 40.4 41.0  MCV 77.4* 77.9* 79.1* 77.5* 77.7*  --   PLT 211 238 270 207 237  --    Blood Culture    Component Value Date/Time   SDES TISSUE 11/14/2022 0919   SPECREQUEST NONE 11/14/2022 0919   CULT  11/14/2022 0919    FEW PROTEUS MIRABILIS RARE KLEBSIELLA PNEUMONIAE NO ANAEROBES ISOLATED FEW ENTEROCOCCUS FAECALIS    REPTSTATUS 11/19/2022 FINAL 11/14/2022 0919    Cardiac Enzymes: No results for input(s): "CKTOTAL", "CKMB", "CKMBINDEX", "TROPONINI" in the last 168 hours. CBG: Recent Labs  Lab 02/05/23 1354 02/05/23 1545 02/05/23 1857 02/05/23 1925 02/05/23 2034  GLUCAP >600* 582* 472* 394* 314*   Iron Studies: No results for input(s): "IRON", "TIBC", "TRANSFERRIN", "FERRITIN" in the last 72 hours. @lablastinr3 @ Studies/Results: No results found. Medications:  [START ON 02/06/2023] ceFEPime (MAXIPIME) IV  insulin 9.5 Units/hr (02/05/23 2035)    amiodarone  200 mg Oral Daily   apixaban  5 mg Oral BID   atorvastatin  80 mg Oral Daily   carvedilol  6.25 mg Oral BID WC   clonazePAM  0.5 mg Oral BID   [START ON 02/06/2023] levothyroxine  75 mcg Oral Q0600   mirtazapine  7.5 mg Oral QHS   pantoprazole  40 mg Oral BID AC   [START ON 02/06/2023] venlafaxine XR  75 mg Oral Q breakfast    Outpatient Dialysis Orders: TTS [facility]  4h  400/800   2/2 bath   [facility] TDC   Heparin 4000 + 2000 midrun  EDW was just lowered to 79 kg  - rocaltrol 0.75 mcg po three times per week - mircera 120 mcg  IV q 4 wks - last mircera given 8/10 (not given after discharge)  Just discharged 01/17/23  Assessment/Plan: # Intractable Nausea/vomiting: recent admission with the same felt to be colitis. GI saw her. Possibly some gastroparesis. Likely now made worse by HHS -mgmt per primary team -consider GI   # ESRD: TTS schedule. HD tomorrow off schedule - AVF is not salvageable per prior vascular  notes, using TDC   # HTN and volume   -Uses midodrine pre HD. Will order. Some volume excess issues in the past.   # Hyponatremia  - currently 127 -optimizing volume with HD, see above   # Anemia of CKD  - Last ESA given on 01/27/23. Hgb now at goal. No needs   # Metabolic bone disease  - Renvela stopped given concern for possible GI motility disorder. Cont home [place] - continue calcitriol 0.75 mcg three times a week  # HHS: stop the fluids. Insulin per primary

## 2023-02-05 NOTE — ED Notes (Signed)
FSBG 482

## 2023-02-05 NOTE — TOC Initial Note (Signed)
Transition of Care [facility]) - Initial/Assessment Note    Patient Details  Name: Susan [name] MRN: [id number] Date of Birth: 29-Jan-1951  Transition of Care [facility]) CM/SW Contact:    Lockie Pares, RN Phone Number: 02/05/2023, 8:54 PM  Clinical Narrative:                 Patient just discharged, readmitted with hyperglycemia. Has [facility] for full [facility] services. Has dialysis at [facility].  Will need to continue HH post DC TOC iwll follow for needs, recommendations and transitions of care    Barriers to Discharge: Continued Medical Work up   Patient Goals and CMS Choice            Expected Discharge Plan and Services   Discharge Planning Services: CM Consult   Living arrangements for the past 2 months: Single Family Home                                      Prior Living Arrangements/Services Living arrangements for the past 2 months: Single Family Home Lives with:: Spouse Patient language and need for interpreter reviewed:: Yes        Need for Family Participation in Patient Care: Yes (Comment) Care giver support system in place?: Yes (comment)   Criminal Activity/Legal Involvement Pertinent to Current Situation/Hospitalization: No - Comment as needed  Activities of Daily Living      Permission Sought/Granted                  Emotional Assessment         Alcohol / Substance Use: Not Applicable    Admission diagnosis:  Urinary tract infection without hematuria, site unspecified [N39.0] Hyperglycemia due to diabetes mellitus (HCC) [E11.65] Hyperosmolar hyperglycemic state (HHS) (HCC) [E11.00] Patient Active Problem List   Diagnosis Date Noted   Hyperosmolar hyperglycemic state (HHS) (HCC) 02/05/2023   Depression 01/29/2023   Nausea 01/24/2023   Intractable nausea and vomiting 01/20/2023   Proctitis 01/15/2023   Acute on chronic anemia 11/25/2022   Right below-knee amputee (HCC) 11/20/2022   Cellulitis of left lower extremity  11/14/2022   Maggot infestation 11/12/2022   Complicated wound infection 11/10/2022   Secondary hypercoagulable state (HCC) 09/04/2022   Right sided abdominal pain 08/31/2022   Constipation 06/07/2022   History of Clostridioides difficile colitis 06/06/2022   Below-knee amputation of right lower extremity (HCC) 06/06/2022   Diverticulitis 06/05/2022   Stercoral colitis 06/05/2022   C. 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) 04/07/2022   Allergy, unspecified, initial encounter 04/07/2022   Dependence on renal dialysis (HCC) 04/07/2022   Gout due to renal impairment, unspecified site 04/07/2022   Hypertensive heart and chronic kidney disease with heart failure and with stage 5 chronic kidney disease, or end stage renal disease (HCC) 04/07/2022   Personal history of transient ischemic attack (TIA), and cerebral infarction without residual deficits 04/07/2022  Renal osteodystrophy 04/07/2022   Venous stasis ulcer of right calf (HCC) 03/31/2022   Fistula, colovaginal 03/26/2022   Diarrhea 03/26/2022   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   Sepsis without acute organ dysfunction (HCC)    Bacteremia    Acute pancreatitis 02/01/2022   Abdominal pain 02/01/2022   SIRS (systemic inflammatory response syndrome) (HCC) 02/01/2022   Transaminitis 02/01/2022   History of anemia due to chronic kidney disease 02/01/2022   Paroxysmal  atrial fibrillation (HCC) 02/01/2022   Uncontrolled type 2 diabetes mellitus with hyperglycemia, with long-term current use of insulin (HCC) 01/14/2022   NSTEMI (non-ST elevated myocardial infarction) (HCC) 03/05/2021   Acute renal failure superimposed on stage 4 chronic kidney disease (HCC) 08/22/2020   Hypoalbuminemia 05/25/2020   GERD (gastroesophageal reflux disease) 05/25/2020   Pressure injury of skin 05/17/2020   Acute on chronic combined systolic and diastolic congestive heart failure (HCC) 03/07/2020   Type 2 diabetes mellitus with diabetic polyneuropathy, with long-term current use of insulin (HCC) 03/07/2020   Obesity, Class III, BMI 40-49.9 (morbid obesity) (HCC) 03/07/2020   Common bile duct (CBD) obstruction 05/28/2019   Benign neoplasm of ascending colon    Benign neoplasm of transverse colon    Benign neoplasm of descending colon    Benign neoplasm of sigmoid colon    Gastric polyps    Hyperkalemia 03/11/2019   Prolonged QT interval 03/11/2019   Acute blood loss anemia 03/11/2019   Onychomycosis 06/21/2018   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)    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 (HCC)    PVD (peripheral vascular disease) (HCC) 10/26/2017   E-coli UTI 07/27/2017   Hypothyroidism 07/27/2017   AKI (acute kidney injury) (HCC)    PAH (pulmonary artery hypertension) (HCC)    Impaired ambulation 07/19/2017   Nausea & vomiting 07/15/2017   Leg cramps 02/27/2017   Peripheral edema 01/12/2017   Diabetic neuropathy (HCC) 11/12/2016   CKD (chronic kidney disease), stage IV (HCC) 10/24/2015   Anemia of chronic disease 10/03/2015   Historical diagnosis of generalized anxiety disorder 10/03/2015   Secondary insomnia 10/03/2015   Hyperglycemia due to diabetes mellitus (HCC) 06/07/2015   Non compliance with medical treatment 04/17/2014   Rotator cuff tear 03/14/2014   Obesity 09/23/2013   Chronic HFrEF (heart failure with reduced ejection  fraction) (HCC) 06/03/2013   Hypotension 12/25/2012   Hyponatremia 12/25/2012   Urinary incontinence    MDD (major depressive disorder), recurrent episode, moderate (HCC) 11/12/2010   RBBB (right bundle branch block)    Wide-complex tachycardia    Coronary artery disease    Hyperlipemia 01/22/2009   Chronic hypotension 01/22/2009   PCP:  Donita Brooks, MD Pharmacy:   [facility] [phone] [place] Otto, [place] - [address] [place] [address] Phone: [phone] Fax: [phone]     Social Determinants of Health (SDOH) Social History: SDOH Screenings   Food Insecurity: No Food Insecurity (01/15/2023)  Housing: Low Risk  (01/15/2023)  Transportation Needs: No Transportation Needs (01/15/2023)  Utilities: Not At Risk (01/15/2023)  Alcohol Screen: Low Risk  (08/16/2021)  Depression (PHQ2-9): Low Risk  (09/29/2022)  Financial Resource Strain: Low Risk  (08/16/2021)  Physical Activity: Inactive (08/16/2021)  Social Connections: Moderately Isolated (08/16/2021)  Stress: No Stress Concern Present (08/16/2021)  Tobacco Use: Medium Risk (02/05/2023)   SDOH Interventions:     Readmission Risk Interventions    01/21/2023    3:09 PM 09/04/2022  5:11 PM 05/12/2022    5:04 PM  Readmission Risk Prevention Plan  Transportation Screening Complete Complete Complete  Medication Review (RN Care Manager) Referral to Pharmacy Complete   PCP or Specialist appointment within 3-5 days of discharge Complete Complete   HRI or Home Care Consult Complete Complete   SW Recovery Care/Counseling Consult Complete Complete Complete  Palliative Care Screening Not Applicable Not Applicable   Skilled Nursing Facility Not Applicable Complete Complete

## 2023-02-05 NOTE — ED Notes (Signed)
RN spoke with lab who stated that serum creatinine will be added on to previously drawn labs.

## 2023-02-05 NOTE — ED Triage Notes (Signed)
Pt c/o vomiting and nausea, abd pain, diarrhea all day yesterday; denies fevers; pt hospitalized recently for same, denies urinary issues; dialysis T, TH, Sa, last dialyzed Tuesday; hx DM, CAD, CHF, ESRD

## 2023-02-05 NOTE — Progress Notes (Signed)
Pt  received in room 5W20. Alert and oriented. Vitals obtained. Telemetry applied and called in to CCMD. Skin assessment completed. See wound flowsheet for details. Pt made comfortable and door left opened. CBG completed by Gilmer Mor RN as pt on insulins drip.

## 2023-02-05 NOTE — Progress Notes (Signed)
ED Pharmacy Antibiotic Sign Off An antibiotic consult was received from an ED provider for cefepime per pharmacy dosing for UTI. A chart review was completed to assess appropriateness.   The following one time order(s) were placed:  Cefepime 2g IV x 1  Further antibiotic and/or antibiotic pharmacy consults should be ordered by the admitting provider if indicated.   Thank you for allowing pharmacy to be a part of this patient's care.   Daylene Posey, [facility]  Clinical Pharmacist 02/05/23 4:35 PM

## 2023-02-05 NOTE — ED Provider Notes (Signed)
Susan [name] EMERGENCY DEPARTMENT AT [facility] Provider Note   CSN: [id number] Arrival date & time: 02/05/23  1048     History  Chief Complaint  Patient presents with   Emesis   Hyperglycemia    Susan [name] is a 72 y.o. female.  With a past medical history of ESRD on dialysis, peripheral vascular disease status post right BKA, A-fib on Eliquis and type 2 diabetes who presents to the ED for hyperglycemia.  She was recently admitted to this hospital and discharged back home under the care of her husband on September 9.  Since that time she has had difficulty controlling her blood glucose at home and began to have nausea and vomiting today.  No chest pain, shortness of breath, fevers or chills   Emesis Hyperglycemia Associated symptoms: nausea and vomiting        Home Medications Prior to Admission medications   Medication Sig Start Date End Date Taking? Authorizing Provider  acetaminophen (TYLENOL) 325 MG tablet Take 2 tablets (650 mg total) by mouth every 6 (six) hours as needed for mild pain (or Fever >/= 101). 12/10/22   Angiulli, Mcarthur Rossetti, PA-C  albuterol (VENTOLIN HFA) 108 (90 Base) MCG/ACT inhaler Inhale 1-2 puffs into the lungs every 6 (six) hours as needed for wheezing or shortness of breath. 12/11/22   Angiulli, Mcarthur Rossetti, PA-C  amiodarone (PACERONE) 200 MG tablet Take 1 tablet (200 mg total) by mouth daily. 12/11/22   Angiulli, Mcarthur Rossetti, PA-C  apixaban (ELIQUIS) 5 MG TABS tablet Take 1 tablet (5 mg total) by mouth 2 (two) times daily. 12/11/22   Angiulli, Mcarthur Rossetti, PA-C  ascorbic acid (VITAMIN C) 500 MG tablet Take 1 tablet (500 mg total) by mouth daily. 12/11/22   Angiulli, Mcarthur Rossetti, PA-C  atorvastatin (LIPITOR) 80 MG tablet TAKE 1 TABLET BY MOUTH EVERY DAY 12/26/22   Bensimhon, Bevelyn Buckles, MD  carvedilol (COREG) 6.25 MG tablet Take 6.25 mg by mouth 2 (two) times daily with a meal. 08/11/22   [provider]  clonazePAM (KLONOPIN) 0.5 MG disintegrating tablet  Take 1 tablet (0.5 mg total) by mouth 2 (two) times daily. 01/19/23   Donita Brooks, MD  ferric citrate (AURYXIA) 1 GM 210 MG(Fe) tablet Take 2 tablets (420 mg total) by mouth 3 (three) times daily with meals. 02/02/23 03/04/23  Zigmund Daniel., MD  folic acid (FOLVITE) 1 MG tablet Take 1 tablet (1 mg total) by mouth daily. 02/02/23 04/03/23  Zigmund Daniel., MD  insulin aspart (NOVOLOG) 100 UNIT/ML injection Inject 0-15 Units into the skin 3 (three) times daily with meals. CBG < 70: treat low blood sugar CBG 70 - 120: 0 units CBG 121 - 150: 2 units CBG 151 - 200: 3 units CBG 201 - 250: 5 units CBG 251 - 300: 8 units CBG 301 - 350: 11 units CBG 351 - 400: 15 units CBG > 400: call MD 02/02/23   Zigmund Daniel., MD  insulin glargine (LANTUS) 100 UNIT/ML Solostar Pen Inject 26 Units into the skin daily. 02/02/23   Zigmund Daniel., MD  levothyroxine (SYNTHROID) 75 MCG tablet Take 1 tablet (75 mcg total) by mouth daily before breakfast. 12/11/22   Angiulli, Mcarthur Rossetti, PA-C  melatonin 3 MG TABS tablet Take 1 tablet (3 mg total) by mouth at bedtime. 02/02/23 03/04/23  Zigmund Daniel., MD  methocarbamol (ROBAXIN) 750 MG tablet Take 1 tablet (750 mg total) by mouth every  6 (six) hours as needed for muscle spasms. 12/11/22   Angiulli, Mcarthur Rossetti, PA-C  midodrine (PROAMATINE) 10 MG tablet Take 1 tablet (10 mg total) by mouth Every Tuesday,Thursday,and Saturday with dialysis. 12/11/22   Angiulli, Mcarthur Rossetti, PA-C  mirtazapine (REMERON) 7.5 MG tablet Take 1 tablet (7.5 mg total) by mouth at bedtime. 02/02/23 03/04/23  Zigmund Daniel., MD  nystatin (MYCOSTATIN) 100000 UNIT/ML suspension Take 5 mLs (500,000 Units total) by mouth 4 (four) times daily for 4 days. 02/02/23 02/06/23  Zigmund Daniel., MD  pantoprazole (PROTONIX) 40 MG tablet Take 1 tablet (40 mg total) by mouth 2 (two) times daily before a meal. 01/09/23 01/09/24  Donita Brooks, MD  polyethylene glycol (MIRALAX / GLYCOLAX)  17 g packet Take 17 g by mouth daily. Patient taking differently: Take 17 g by mouth See admin instructions. Every other day 12/10/22   Angiulli, Mcarthur Rossetti, PA-C  prochlorperazine (COMPAZINE) 5 MG tablet Take 1 tablet (5 mg total) by mouth every 8 (eight) hours as needed for up to 5 days for nausea or vomiting. 02/02/23 02/07/23  Zigmund Daniel., MD  venlafaxine XR (EFFEXOR-XR) 75 MG 24 hr capsule Take 1 capsule (75 mg total) by mouth daily with breakfast. Stop trintellix 01/05/23   Donita Brooks, MD  Zinc Sulfate 220 (50 Zn) MG TABS Take 1 tablet (220 mg total) by mouth daily. 12/11/22   Angiulli, Mcarthur Rossetti, PA-C      Allergies    Cephalexin and Codeine    Review of Systems   Review of Systems  Gastrointestinal:  Positive for nausea and vomiting.  All other systems reviewed and are negative.   Physical Exam Updated Vital Signs BP (!) 177/93   Pulse 94   Temp 97.7 F (36.5 C) (Oral)   Resp 14   Ht 5\' 5"  (1.651 m)   Wt 81 kg   SpO2 100%   BMI 29.72 kg/m  Physical Exam Vitals and nursing note reviewed.  HENT:     Head: Normocephalic and atraumatic.  Eyes:     Pupils: Pupils are equal, round, and reactive to light.  Cardiovascular:     Rate and Rhythm: Normal rate and regular rhythm.  Pulmonary:     Effort: Pulmonary effort is normal.     Breath sounds: Normal breath sounds.  Abdominal:     Palpations: Abdomen is soft.     Tenderness: There is no abdominal tenderness.  Musculoskeletal:     Comments: Status post right BKA  Skin:    General: Skin is warm and dry.  Neurological:     Mental Status: She is alert.  Psychiatric:        Mood and Affect: Mood normal.     ED Results / Procedures / Treatments   Labs (all labs ordered are listed, but only abnormal results are displayed) Labs Reviewed  COMPREHENSIVE METABOLIC PANEL - Abnormal; Notable for the following components:      Result Value   Sodium 127 (*)    Chloride 89 (*)    Glucose, Bld 688 (*)    BUN 48  (*)    Creatinine, Ser 4.56 (*)    Alkaline Phosphatase 222 (*)    GFR, Estimated 10 (*)    All other components within normal limits  CBC - Abnormal; Notable for the following components:   RBC 5.20 (*)    Hemoglobin 11.7 (*)    MCV 77.7 (*)    MCH 22.5 (*)  MCHC 29.0 (*)    RDW 20.5 (*)    All other components within normal limits  URINALYSIS, ROUTINE W REFLEX MICROSCOPIC - Abnormal; Notable for the following components:   Color, Urine AMBER (*)    APPearance TURBID (*)    Glucose, UA >=500 (*)    Hgb urine dipstick MODERATE (*)    Ketones, ur 5 (*)    Protein, ur 100 (*)    Leukocytes,Ua LARGE (*)    Bacteria, UA RARE (*)    All other components within normal limits  OSMOLALITY - Abnormal; Notable for the following components:   Osmolality 332 (*)    All other components within normal limits  BETA-HYDROXYBUTYRIC ACID - Abnormal; Notable for the following components:   Beta-Hydroxybutyric Acid 1.06 (*)    All other components within normal limits  BASIC METABOLIC PANEL - Abnormal; Notable for the following components:   Sodium 127 (*)    Chloride 89 (*)    Glucose, Bld 593 (*)    BUN 45 (*)    Creatinine, Ser 4.34 (*)    GFR, Estimated 10 (*)    Anion gap 16 (*)    All other components within normal limits  CBG MONITORING, ED - Abnormal; Notable for the following components:   Glucose-Capillary >600 (*)    All other components within normal limits  CBG MONITORING, ED - Abnormal; Notable for the following components:   Glucose-Capillary >600 (*)    All other components within normal limits  I-STAT VENOUS BLOOD GAS, ED - Abnormal; Notable for the following components:   Sodium 127 (*)    All other components within normal limits  CBG MONITORING, ED - Abnormal; Notable for the following components:   Glucose-Capillary 582 (*)    All other components within normal limits  LIPASE, BLOOD  BETA-HYDROXYBUTYRIC ACID    EKG None  Radiology No results  found.  Procedures Procedures    Medications Ordered in ED Medications  insulin regular, human (MYXREDLIN) 100 units/ 100 mL infusion (has no administration in time range)  lactated ringers infusion (has no administration in time range)  dextrose 5 % in lactated ringers infusion (0 mLs Intravenous Hold 02/05/23 1656)  dextrose 50 % solution 0-50 mL (has no administration in time range)  potassium chloride 10 mEq in 100 mL IVPB (has no administration in time range)  ceFEPIme (MAXIPIME) 2 g in sodium chloride 0.9 % 100 mL IVPB (has no administration in time range)  lactated ringers bolus 1,000 mL (0 mLs Intravenous Stopped 02/05/23 1541)    ED Course/ Medical Decision Making/ A&P Clinical Course as of 02/05/23 1701  Thu Feb 05, 2023  1617 Anion gap: 15 [MP]  1659 Reviewed labs.  Potassium of 4.1.  Not in DKA based on VBG.  UA shows question of UTI.  Will cover with antibiotics and sent for culture.  Blood glucose downtrending.  Will obtain repeat labs, initiate potassium repletion and insulin drip and admit to medicine [MP]    Clinical Course User Index [MP] Royanne Foots, DO                                 Medical Decision Making 72 year old female with history as above including recent admission for nausea vomiting and poorly controlled diabetes presenting for hyperglycemia.  Hypertensive on my initial assessment.  Afebrile.  Benign physical exam.  Will evaluate for DKA/HHS and provide fluids upfront.  Will use fluids judiciously given her history of ESRD on dialysis so as not to fluid overload.  Will look for any evidence of infection with urinalysis and CBC.  No respiratory symptoms that would be concerning for pneumonia.  Will provide insulin and potassium repletion based on our HHS protocol after initial fluid bolus  Amount and/or Complexity of Data Reviewed Labs: ordered. Decision-making details documented in ED Course.  Risk Prescription drug management. Decision regarding  hospitalization.           Final Clinical Impression(s) / ED Diagnoses Final diagnoses:  Hyperglycemia due to diabetes mellitus (HCC)  Urinary tract infection without hematuria, site unspecified    Rx / DC Orders ED Discharge Orders     None         Royanne Foots, DO 02/05/23 1701

## 2023-02-05 NOTE — ED Notes (Signed)
ED TO INPATIENT HANDOFF REPORT  ED Nurse Name and Phone #:[phone]  S Name/Age/Gender Susan [name] 72 y.o. female Room/Bed: 007C/007C  Code Status   Code Status: Full Code  Home/SNF/Other Home Patient oriented to: self, place, time, and situation Is this baseline? Yes   Triage Complete: Triage complete  Chief Complaint Hyperosmolar hyperglycemic state (HHS) (HCC) [E11.00]  Triage Note Pt c/o vomiting and nausea, abd pain, diarrhea all day yesterday; denies fevers; pt hospitalized recently for same, denies urinary issues; dialysis T, TH, Sa, last dialyzed Tuesday; hx DM, CAD, CHF, ESRD   Allergies Allergies  Allergen Reactions   Cephalexin Diarrhea and Other (See Comments)   Codeine Nausea And Vomiting and Other (See Comments)    Level of Care/Admitting Diagnosis ED Disposition     ED Disposition  Admit   Condition  --   Comment  Hospital Area: [facility] [[id number]]  Level of Care: Progressive [102]  Admit to Progressive based on following criteria: GI, ENDOCRINE disease patients with GI bleeding, acute liver failure or pancreatitis, stable with diabetic ketoacidosis or thyrotoxicosis (hypothyroid) state.  May admit patient to [name] or [facility] if equivalent level of care is available:: Yes  Covid Evaluation: Asymptomatic - no recent exposure (last 10 days) testing not required  Diagnosis: Hyperosmolar hyperglycemic state (HHS) [facility]) [[id number]]  Admitting Physician: Alan Mulder [[id number]]  Attending Physician: Alan Mulder [[id number]]  Certification:: I certify this patient will need inpatient services for at least 2 midnights  Expected Medical Readiness: 02/07/2023          B Medical/Surgery History Past Medical History:  Diagnosis Date   Anemia    hx   Anxiety    Arthritis    "generalized" (03/15/2014)   CAD (coronary artery disease)    MI in 2000 - MI  2007 - treated bare metal stent (no nuclear since then as 9/11)    Carotid artery disease (HCC)    Chronic diastolic heart failure (HCC)    a) ECHO (08/2013) EF 55-60% and RV function nl b) RHC (08/2013) RA 4, RV 30/5/7, PA 25/10 (16), PCWP 7, Fick CO/CI 6.3/2.7, PVR 1.5 WU, PA 61 and 66%   Daily headache    "~ every other day; since I fell in June" (03/15/2014)   Depression    Diabetic retinopathy (HCC)    Dyslipidemia    ESRD (end stage renal disease) (HCC)    Dialysis on Tues Thurs Sat   Exertional shortness of breath    History of kidney stones    HTN (hypertension)    Hypothyroidism    Obesity    Osteoarthritis    PAF (paroxysmal atrial fibrillation) (HCC)    Peripheral neuropathy    bilateral feet/hands   PONV (postoperative nausea and vomiting)    RBBB (right bundle branch block)    Old   Stroke (HCC)    mini strokes   Type II diabetes mellitus (HCC)    Type II, [name]. patient has omnipod insulin pump with Novolin R Insulin   Past Surgical History:  Procedure Laterality Date   A/V FISTULAGRAM Left 11/07/2022   Procedure: A/V Fistulagram;  Surgeon: Leonie Douglas, MD;  [facility] INV[facility]IVE CV [facility];  Service: Cardiovascular;  Laterality: Left;   ABDOMINAL HYSTERECTOMY  1980's   AMPUTATION Right 02/24/2018   Procedure: RIGHT FOOT GREAT TOE AND 2ND TOE AMPUTATION;  Surgeon: Nadara Mustard, MD;  Location: [facility] OR;  Service: Orthopedics;  Laterality: Right;  AMPUTATION Right 04/30/2018   Procedure: RIGHT TRANSMETATARSAL AMPUTATION;  Surgeon: Nadara Mustard, MD;  Location: [facility] OR;  Service: Orthopedics;  Laterality: Right;   AMPUTATION Right 05/02/2022   Procedure: RIGHT BELOW KNEE AMPUTATION;  Surgeon: Nadara Mustard, MD;  Location: [facility] OR;  Service: Orthopedics;  Laterality: Right;   APPLICATION OF WOUND VAC Right 06/13/2022   Procedure: APPLICATION OF WOUND VAC;  Surgeon: Nadara Mustard, MD;  Location: [facility] OR;  Service: Orthopedics;  Laterality: Right;   APPLICATION OF WOUND VAC Left 11/14/2022   Procedure: APPLICATION OF  WOUND VAC;  Surgeon: Nadara Mustard, MD;  [facility]: [facility] OR;  Service: Orthopedics;  Laterality: Left;   AV FISTULA PLACEMENT Left 04/02/2022   Procedure: LEFT ARM ARTERIOVENOUS (AV) FISTULA CREATION;  Surgeon: Nada Libman, MD;  [facility]: [facility] OR;  Service: Vascular;  Laterality: Left;  PERIPHERAL NERVE BLOCK   BASCILIC VEIN TRANSPOSITION Left 07/31/2022   Procedure: LEFT ARM SECOND STAGE BASILIC VEIN TRANSPOSITION;  Surgeon: Nada Libman, MD;  [facility];  Service: Vascular;  Laterality: Left;   BIOPSY  05/27/2020   Procedure: BIOPSY;  Surgeon: Lanelle Bal, DO;  [facility] ENDO [facility];  Service: Endoscopy;;   CATARACT EXTRACTION, BILATERAL Bilateral ?2013   COLONOSCOPY W/ POLYPECTOMY     COLONOSCOPY WITH PROPOFOL N/A 03/13/2019   Procedure: COLONOSCOPY WITH PROPOFOL;  Surgeon: Beverley Fiedler, MD;  Location: [name] ENDOSCOPY;  Service: Gastroenterology;  Laterality: N/A;   CORONARY ANGIOPLASTY WITH STENT PLACEMENT  1999; 2007   "1 + 1"   ERCP N/A 02/03/2022   Procedure: ENDOSCOPIC RETROGRADE CHOLANGIOPANCREATOGRAPHY (ERCP);  Surgeon: Jeani Hawking, MD;  Location: [facility] ENDOSCOPY;  Service: Gastroenterology;  Laterality: N/A;   ESOPHAGOGASTRODUODENOSCOPY (EGD) WITH PROPOFOL N/A 03/13/2019   Procedure: ESOPHAGOGASTRODUODENOSCOPY (EGD) WITH PROPOFOL;  Surgeon: Beverley Fiedler, MD;  Location: [facility] ENDOSCOPY;  Service: Gastroenterology;  Laterality: N/A;   ESOPHAGOGASTRODUODENOSCOPY (EGD) WITH PROPOFOL N/A 05/27/2020   Procedure: ESOPHAGOGASTRODUODENOSCOPY (EGD) WITH PROPOFOL;  Surgeon: Lanelle Bal, DO;  [facility];  Service: Endoscopy;  Laterality: N/A;   ESOPHAGOGASTRODUODENOSCOPY (EGD) WITH PROPOFOL N/A 09/03/2022   Procedure: ESOPHAGOGASTRODUODENOSCOPY (EGD) WITH PROPOFOL;  Surgeon: Jeani Hawking, MD;  Location: [facility] ENDOSCOPY;  Service: Gastroenterology;  Laterality: N/A;   EYE SURGERY Bilateral    lazer   FLEXIBLE SIGMOIDOSCOPY N/A 05/23/2022   Procedure: FLEXIBLE SIGMOIDOSCOPY;   Surgeon: Jeani Hawking, MD;  Location: [facility] ENDOSCOPY;  Service: Gastroenterology;  Laterality: N/A;   FLEXIBLE SIGMOIDOSCOPY N/A 05/24/2022   Procedure: FLEXIBLE SIGMOIDOSCOPY;  Surgeon: Imogene Burn, MD;  Location: [facility] ENDOSCOPY;  Service: Gastroenterology;  Laterality: N/A;   FLEXIBLE SIGMOIDOSCOPY N/A 09/03/2022   Procedure: FLEXIBLE SIGMOIDOSCOPY;  Surgeon: Jeani Hawking, MD;  Location: [facility] ENDOSCOPY;  Service: Gastroenterology;  Laterality: N/A;   HEMOSTASIS CLIP PLACEMENT  03/13/2019   Procedure: HEMOSTASIS CLIP PLACEMENT;  Surgeon: Beverley Fiedler, MD;  Location: [facility] ENDOSCOPY;  Service: Gastroenterology;;   HEMOSTASIS CLIP PLACEMENT  05/23/2022   Procedure: HEMOSTASIS CLIP PLACEMENT;  Surgeon: Jeani Hawking, MD;  Location: [facility] ENDOSCOPY;  Service: Gastroenterology;;   HEMOSTASIS CONTROL  05/24/2022   Procedure: HEMOSTASIS CONTROL;  Surgeon: Imogene Burn, MD;  Location: [facility] ENDOSCOPY;  Service: Gastroenterology;;   HOT HEMOSTASIS N/A 05/23/2022   Procedure: HOT HEMOSTASIS (ARGON PLASMA COAGULATION/BICAP);  Surgeon: Jeani Hawking, MD;  [facility]  Service: Gastroenterology;  Laterality: N/A;   I & D EXTREMITY Left 05/05/2022   Procedure: IRRIGATION AND DEBRIDEMENT LEFT ARM AV FISTULA;  Surgeon: Chestine Spore,  Canary Brim, MD;  Location: [facility] OR;  Service: Vascular;  Laterality: Left;   I & D EXTREMITY N/A 11/14/2022   Procedure: IRRIGATION AND DEBRIDEMENT OF LOWER EXTREMITY WOUND;  Surgeon: Nadara Mustard, MD;  [facility];  Service: Orthopedics;  Laterality: N/A;   INSERTION OF DIALYSIS CATHETER Right 04/02/2022   Procedure: INSERTION OF TUNNELED DIALYSIS CATHETER;  Surgeon: Nada Libman, MD;  Location: [facility] OR;  Service: Vascular;  Laterality: Right;   KNEE ARTHROSCOPY Left 10/25/2006   POLYPECTOMY  03/13/2019   Procedure: POLYPECTOMY;  Surgeon: Beverley Fiedler, MD;  [facility];  Service: Gastroenterology;;   REMOVAL OF STONES  02/03/2022   Procedure: REMOVAL OF STONES;   Surgeon: Jeani Hawking, MD;  Location: [facility] ENDOSCOPY;  Service: Gastroenterology;;   REVISON OF ARTERIOVENOUS FISTULA Left 08/20/2022   Procedure: REVISON OF LEFT ARM ARTERIOVENOUS FISTULA;  Surgeon: Nada Libman, MD;  [facility];  Service: Vascular;  Laterality: Left;   RIGHT HEART CATH N/A 07/24/2017   Procedure: RIGHT HEART CATH;  Surgeon: Dolores Patty, MD;  [facility];  Service: Cardiovascular;  Laterality: N/A;   RIGHT HEART CATHETERIZATION N/A 09/22/2013   Procedure: RIGHT HEART CATH;  Surgeon: Dolores Patty, MD;  Location: [facility] CATH LAB;  Service: Cardiovascular;  Laterality: N/A;   SHOULDER ARTHROSCOPY WITH OPEN ROTATOR CUFF REPAIR Right 03/14/2014   Procedure: RIGHT SHOULDER ARTHROSCOPY WITH BICEPS RELEASE, OPEN SUBSCAPULA REPAIR, OPEN SUPRASPINATUS REPAIR.;  Surgeon: Cammy Copa, MD;  Location: [facility] OR;  Service: Orthopedics;  Laterality: Right;   SPHINCTEROTOMY  02/03/2022   Procedure: SPHINCTEROTOMY;  Surgeon: Jeani Hawking, MD;  Location: [facility] ENDOSCOPY;  Service: Gastroenterology;;   STUMP REVISION Right 06/13/2022   Procedure: REVISION RIGHT BELOW KNEE AMPUTATION;  Surgeon: Nadara Mustard, MD;  Location: [facility] OR;  Service: Orthopedics;  Laterality: Right;   TEE WITHOUT CARDIOVERSION N/A 02/04/2022   Procedure: TRANSESOPHAGEAL ECHOCARDIOGRAM (TEE);  Surgeon: Dolores Patty, MD;  Location: [facility] ENDOSCOPY;  Service: Cardiovascular;  Laterality: N/A;   THROMBECTOMY W/ EMBOLECTOMY Left 08/20/2022   Procedure: THROMBECTOMY OF LEFT ARM ARTERIOVENOUS FISTULA;  Surgeon: Nada Libman, MD;  [facility];  Service: Vascular;  Laterality: Left;   TOE AMPUTATION Right 02/24/2018   GREAT TOE AND 2ND TOE AMPUTATION   TUBAL LIGATION  1970's     A IV Location/Drains/Wounds Patient Lines/Drains/Airways Status     Active Line/Drains/Airways     Name Placement date Placement time Site Days   Peripheral IV 02/05/23 22 G 1.75" Right;Lateral Forearm 02/05/23   1334  Forearm  less than 1   Peripheral IV 02/05/23 22 G 2.5" Anterior;Right;Upper;Lateral Arm 02/05/23  1405  Arm  less than 1   Fistula / Graft Left Upper arm Arteriovenous fistula 07/31/22  0942  Upper arm  189   Hemodialysis Catheter Right Subclavian 06/06/22  --  Subclavian  244   Pressure Injury 01/29/23 Sacrum Stage 2 -  Partial thickness loss of dermis presenting as a shallow open injury with a red, pink wound bed without slough. 01/29/23  1550  -- 7   Wound / Incision (Open or Dehisced) 08/27/22 Irritant Dermatitis (Moisture Associated Skin Damage) Right 08/27/22  1000  --  162   Wound / Incision (Open or Dehisced) 11/10/22 Diabetic ulcer Pretibial Left;Lateral;Lower 11/10/22  2300  Pretibial  87            Intake/Output Last 24 hours  Intake/Output Summary (Last 24 hours) at 02/05/2023 1740  Last data filed at 02/05/2023 1737 Gross per 24 hour  Intake 1100.11 ml  Output --  Net 1100.11 ml    Labs/Imaging Results for orders placed or performed during the hospital encounter of 02/05/23 (from the past 48 hour(s))  Lipase, blood     Status: None   Collection Time: 02/05/23 11:14 AM  Result Value Ref Range   Lipase 23 11 - 51 U/L    Comment: Performed at [facility], [address] [place], [place] [address]  Comprehensive metabolic panel     Status: Abnormal   Collection Time: 02/05/23 11:14 AM  Result Value Ref Range   Sodium 127 (L) 135 - 145 mmol/L   Potassium 4.3 3.5 - 5.1 mmol/L   Chloride 89 (L) 98 - 111 mmol/L   CO2 23 22 - 32 mmol/L   Glucose, Bld 688 (HH) 70 - 99 mg/dL    Comment: CRITICAL RESULT CALLED TO, READ BACK BY AND VERIFIED WITH GRISWOLD,H RN @ 1244 02/05/23 LEONARD,A Glucose reference range applies only to samples taken after fasting for at least 8 hours.    BUN 48 (H) 8 - 23 mg/dL   Creatinine, Ser 8.11 (H) 0.44 - 1.00 mg/dL   Calcium 9.8 8.9 - 91.4 mg/dL   Total Protein 7.4 6.5 - 8.1 g/dL   Albumin 3.5 3.5 - 5.0 g/dL   AST 21 15 - 41 U/L    ALT 21 0 - 44 U/L   Alkaline Phosphatase 222 (H) 38 - 126 U/L   Total Bilirubin 0.7 0.3 - 1.2 mg/dL   GFR, Estimated 10 (L) >60 mL/min    Comment: (NOTE) Calculated using the CKD-EPI Creatinine Equation (2021)    Anion gap 15 5 - 15    Comment: Performed at [facility], [address] [place], [place] [address]  CBC     Status: Abnormal   Collection Time: 02/05/23 11:14 AM  Result Value Ref Range   WBC 6.9 4.0 - 10.5 K/uL   RBC 5.20 (H) 3.87 - 5.11 MIL/uL   Hemoglobin 11.7 (L) 12.0 - 15.0 g/dL   HCT 62.1 30.8 - 65.7 %   MCV 77.7 (L) 80.0 - 100.0 fL   MCH 22.5 (L) 26.0 - 34.0 pg   MCHC 29.0 (L) 30.0 - 36.0 g/dL   RDW 84.6 (H) 96.2 - 95.2 %   Platelets 237 150 - 400 K/uL   nRBC 0.0 0.0 - 0.2 %    Comment: Performed at [facility], [address] [place], [place] [address]  POC CBG, ED     Status: Abnormal   Collection Time: 02/05/23 11:28 AM  Result Value Ref Range   Glucose-Capillary >600 (HH) 70 - 99 mg/dL    Comment: Glucose reference range applies only to samples taken after fasting for at least 8 hours.  Osmolality     Status: Abnormal   Collection Time: 02/05/23  1:42 PM  Result Value Ref Range   Osmolality 332 (HH) 275 - 295 mOsm/kg    Comment: REPEATED TO VERIFY CRITICAL RESULT CALLED TO, READ BACK BY AND VERIFIED WITH: Ginger Carne RN, 1510 02/05/2023 SANDOVAL K  Performed at [facility], [address] [place], [place] [address]   Beta-hydroxybutyric acid     Status: Abnormal   Collection Time: 02/05/23  1:42 PM  Result Value Ref Range   Beta-Hydroxybutyric Acid 1.06 (H) 0.05 - 0.27 mmol/L    Comment: Performed at [facility],  [address], [place], [place] [address]  I-Stat venous blood gas, ED     Status: Abnormal   Collection Time: 02/05/23  1:51 PM  Result Value Ref Range   pH, Ven 7.369 7.25 - 7.43   pCO2, Ven 44.7 44 - 60 mmHg   pO2, Ven 32 32 - 45 mmHg   Bicarbonate 25.8 20.0 - 28.0 mmol/L   TCO2 27 22 - 32 mmol/L    O2 Saturation 59 %   Acid-Base Excess 0.0 0.0 - 2.0 mmol/L   Sodium 127 (L) 135 - 145 mmol/L   Potassium 4.1 3.5 - 5.1 mmol/L   Calcium, Ion 1.17 1.15 - 1.40 mmol/L   HCT 41.0 36.0 - 46.0 %   Hemoglobin 13.9 12.0 - 15.0 g/dL   Sample type VENOUS    Comment NOTIFIED PHYSICIAN   CBG monitoring, ED     Status: Abnormal   Collection Time: 02/05/23  1:54 PM  Result Value Ref Range   Glucose-Capillary >600 (HH) 70 - 99 mg/dL    Comment: Glucose reference range applies only to samples taken after fasting for at least 8 hours.  Urinalysis, Routine w reflex microscopic -Urine, Clean Catch     Status: Abnormal   Collection Time: 02/05/23  2:18 PM  Result Value Ref Range   Color, Urine AMBER (A) YELLOW    Comment: BIOCHEMICALS MAY BE AFFECTED BY COLOR   APPearance TURBID (A) CLEAR   Specific Gravity, Urine 1.015 1.005 - 1.030   pH 6.0 5.0 - 8.0   Glucose, UA >=500 (A) NEGATIVE mg/dL   Hgb urine dipstick MODERATE (A) NEGATIVE   Bilirubin Urine NEGATIVE NEGATIVE   Ketones, ur 5 (A) NEGATIVE mg/dL   Protein, ur 213 (A) NEGATIVE mg/dL   Nitrite NEGATIVE NEGATIVE   Leukocytes,Ua LARGE (A) NEGATIVE   RBC / HPF 21-50 0 - 5 RBC/hpf   WBC, UA >50 0 - 5 WBC/hpf   Bacteria, UA RARE (A) NONE SEEN   Squamous Epithelial / HPF 0-5 0 - 5 /HPF   WBC Clumps PRESENT     Comment: Performed at [facility], [address], [place], [place] [address]  Basic metabolic panel     Status: Abnormal   Collection Time: 02/05/23  3:43 PM  Result Value Ref Range   Sodium 127 (L) 135 - 145 mmol/L   Potassium 4.1 3.5 - 5.1 mmol/L   Chloride 89 (L) 98 - 111 mmol/L   CO2 22 22 - 32 mmol/L   Glucose, Bld 593 (HH) 70 - 99 mg/dL    Comment: CRITICAL RESULT CALLED TO, READ BACK BY AND VERIFIED WITH GRISWOLD, H. RN @1627  ON 02/05/23 BY MAB MT Glucose reference range applies only to samples taken after fasting for at least 8 hours.    BUN 45 (H) 8 - 23 mg/dL   Creatinine, Ser 8.46 (H) 0.44 - 1.00 mg/dL   Calcium  9.5 8.9 - 96.2 mg/dL   GFR, Estimated 10 (L) >60 mL/min    Comment: (NOTE) Calculated using the CKD-EPI Creatinine Equation (2021)    Anion gap 16 (H) 5 - 15    Comment: Performed at [facility], [address], [place], [place] [address]  CBG monitoring, ED     Status: Abnormal   Collection Time: 02/05/23  3:45 PM  Result Value Ref Range   Glucose-Capillary 582 (HH) 70 - 99 mg/dL    Comment: Glucose reference range applies only to samples taken after fasting for  at least 8 hours.   Comment 1 Call MD NNP PA CNM    *Note: Due to a large number of results and/or encounters for the requested time period, some results have not been displayed. A complete set of results can be found in Results Review.   No results found.  Pending Labs Unresulted Labs (From admission, onward)     Start     Ordered   02/12/23 0500  Creatinine, serum  (enoxaparin (LOVENOX)    CrCl >/= 30 ml/min)  Weekly,   R     Comments: while on enoxaparin therapy   Question:  Specimen collection method  Answer:  IV Team=IV Team collect   02/05/23 1720   02/05/23 1719  CBC  (enoxaparin (LOVENOX)    CrCl >/= 30 ml/min)  Once,   R       Comments: Baseline for enoxaparin therapy IF NOT ALREADY DRAWN.  Notify MD if PLT < 100 K.   Question:  Specimen collection method  Answer:  IV Team=IV Team collect   02/05/23 1720   02/05/23 1719  Creatinine, serum  (enoxaparin (LOVENOX)    CrCl >/= 30 ml/min)  Once,   R       Comments: Baseline for enoxaparin therapy IF NOT ALREADY DRAWN.   Question:  Specimen collection method  Answer:  IV Team=IV Team collect   02/05/23 1720   02/05/23 1327  Beta-hydroxybutyric acid  (Hyperglycemic Hyperosmolar State (HHS))  Now then every 8 hours,   STAT (with TIMED, URGENT occurrences)      02/05/23 1327            Vitals/Pain Today's Vitals   02/05/23 1423 02/05/23 1530 02/05/23 1700 02/05/23 1715  BP: (!) 187/86 (!) 177/93 (!) 157/64   Pulse: 92 94 86   Resp: (!) 24 14 15     Temp: 97.7 F (36.5 C)   97.9 F (36.6 C)  TempSrc: Oral   Oral  SpO2: 100% 100% 100%   Weight:      Height:      PainSc:        Isolation Precautions No active isolations  Medications Medications  insulin regular, human (MYXREDLIN) 100 units/ 100 mL infusion (10.5 Units/hr Intravenous New Bag/Given 02/05/23 1713)  lactated ringers infusion ( Intravenous New Bag/Given 02/05/23 1703)  dextrose 5 % in lactated ringers infusion (0 mLs Intravenous Hold 02/05/23 1656)  dextrose 50 % solution 0-50 mL (has no administration in time range)  potassium chloride 10 mEq in 100 mL IVPB (10 mEq Intravenous New Bag/Given 02/05/23 1705)  amiodarone (PACERONE) tablet 200 mg (has no administration in time range)  atorvastatin (LIPITOR) tablet 80 mg (has no administration in time range)  carvedilol (COREG) tablet 6.25 mg (has no administration in time range)  mirtazapine (REMERON) tablet 7.5 mg (has no administration in time range)  venlafaxine XR (EFFEXOR-XR) 24 hr capsule 75 mg (has no administration in time range)  levothyroxine (SYNTHROID) tablet 75 mcg (has no administration in time range)  pantoprazole (PROTONIX) EC tablet 40 mg (has no administration in time range)  apixaban (ELIQUIS) tablet 5 mg (has no administration in time range)  clonazepam (KLONOPIN) disintegrating tablet 0.5 mg (has no administration in time range)  methocarbamol (ROBAXIN) tablet 750 mg (has no administration in time range)  acetaminophen (TYLENOL) tablet 650 mg (has no administration in time range)    Or  acetaminophen (TYLENOL) suppository 650 mg (has no administration in time range)  oxyCODONE (Oxy IR/ROXICODONE) immediate release tablet 5  mg (has no administration in time range)  ondansetron (ZOFRAN) tablet 4 mg (has no administration in time range)    Or  ondansetron (ZOFRAN) injection 4 mg (has no administration in time range)  lactated ringers bolus 1,000 mL (0 mLs Intravenous Stopped 02/05/23 1541)  ceFEPIme  (MAXIPIME) 2 g in sodium chloride 0.9 % 100 mL IVPB (0 g Intravenous Stopped 02/05/23 1737)    Mobility manual wheelchair     Focused Assessments Renal Assessment Handoff:  Hemodialysis Schedule: Hemodialysis Schedule: Tuesday/Thursday/Saturday Last Hemodialysis date and time: Tuesday 02/03/23 at 1100   Restricted appendage: left arm   R Recommendations: See Admitting Provider Note  Report given to:   Additional Notes:

## 2023-02-05 NOTE — ED Notes (Signed)
FSBG [id number]

## 2023-02-05 NOTE — Progress Notes (Signed)
Pharmacy Antibiotic Note  Susan [name] is a 71 y.o. female admitted on 02/05/2023 with UTI.  Pharmacy has been consulted for cefepime dosing.  Plan: Cefepime 1g q 24 hrs.  Height: 5\' 5"  (165.1 cm) Weight: 81 kg (178 lb 9.2 oz) IBW/kg (Calculated) : 57  Temp (24hrs), Avg:97.8 F (36.6 C), Min:97.7 F (36.5 C), Max:97.9 F (36.6 C)  Recent Labs  Lab 01/30/23 0659 01/31/23 0711 02/01/23 0701 02/02/23 0516 02/05/23 1114 02/05/23 1342 02/05/23 1543  WBC 6.1 6.8 7.5 5.8 6.9  --   --   CREATININE 3.65* 4.30* 3.71* 5.03* 4.56* 4.54* 4.34*    Estimated Creatinine Clearance: 12.1 mL/min (A) (by C-G formula based on SCr of 4.34 mg/dL (H)).    Allergies  Allergen Reactions   Cephalexin Diarrhea and Other (See Comments)   Codeine Nausea And Vomiting and Other (See Comments)      Thank you for allowing pharmacy to be a part of this patient's care.   Reece Leader, Colon Flattery, BCCP Clinical Pharmacist  02/05/2023 8:43 PM   [facility] pharmacy phone numbers are listed on [email or web address]

## 2023-02-05 NOTE — ED Notes (Signed)
FSBG [id number]

## 2023-02-05 NOTE — Plan of Care (Signed)
  Problem: Education: Goal: Knowledge of the prescribed therapeutic regimen will improve Outcome: Progressing Goal: Ability to verbalize activity precautions or restrictions will improve Outcome: Progressing Goal: Understanding of discharge needs will improve Outcome: Progressing   Problem: Activity: Goal: Ability to perform//tolerate increased activity and mobilize with assistive devices will improve Outcome: Progressing   Problem: Clinical Measurements: Goal: Postoperative complications will be avoided or minimized Outcome: Progressing   Problem: Self-Care: Goal: Ability to meet self-care needs will improve Outcome: Progressing   Problem: Self-Concept: Goal: Ability to maintain and perform role responsibilities to the fullest extent possible will improve Outcome: Progressing   Problem: Pain Management: Goal: Pain level will decrease with appropriate interventions Outcome: Progressing   Problem: Skin Integrity: Goal: Demonstration of wound healing without infection will improve Outcome: Progressing   Problem: Education: Goal: Understanding of CV disease, CV risk reduction, and recovery process will improve Outcome: Progressing   Problem: Activity: Goal: Ability to return to baseline activity level will improve Outcome: Progressing   Problem: Health Behavior/Discharge Planning: Goal: Ability to identify and utilize available resources and services will improve Outcome: Progressing   Problem: Fluid Volume: Goal: Ability to achieve a balanced intake and output will improve Outcome: Progressing

## 2023-02-06 DIAGNOSIS — N186 End stage renal disease: Secondary | ICD-10-CM | POA: Diagnosis not present

## 2023-02-06 DIAGNOSIS — I480 Paroxysmal atrial fibrillation: Secondary | ICD-10-CM

## 2023-02-06 DIAGNOSIS — E1100 Type 2 diabetes mellitus with hyperosmolarity without nonketotic hyperglycemic-hyperosmolar coma (NKHHC): Secondary | ICD-10-CM | POA: Diagnosis not present

## 2023-02-06 DIAGNOSIS — I5020 Unspecified systolic (congestive) heart failure: Secondary | ICD-10-CM

## 2023-02-06 LAB — RENAL FUNCTION PANEL
Albumin: 3 g/dL — ABNORMAL LOW (ref 3.5–5.0)
Anion gap: 15 (ref 5–15)
BUN: 53 mg/dL — ABNORMAL HIGH (ref 8–23)
CO2: 21 mmol/L — ABNORMAL LOW (ref 22–32)
Calcium: 9.3 mg/dL (ref 8.9–10.3)
Chloride: 95 mmol/L — ABNORMAL LOW (ref 98–111)
Creatinine, Ser: 4.62 mg/dL — ABNORMAL HIGH (ref 0.44–1.00)
GFR, Estimated: 9 mL/min — ABNORMAL LOW (ref >60)
Glucose, Bld: 78 mg/dL (ref 70–99)
Phosphorus: 3.7 mg/dL (ref 2.5–4.6)
Potassium: 3.6 mmol/L (ref 3.5–5.1)
Sodium: 131 mmol/L — ABNORMAL LOW (ref 135–145)

## 2023-02-06 LAB — CBC
HCT: 35.9 % — ABNORMAL LOW (ref 36.0–46.0)
Hemoglobin: 10.6 g/dL — ABNORMAL LOW (ref 12.0–15.0)
MCH: 22.7 pg — ABNORMAL LOW (ref 26.0–34.0)
MCHC: 29.5 g/dL — ABNORMAL LOW (ref 30.0–36.0)
MCV: 76.9 fL — ABNORMAL LOW (ref 80.0–100.0)
Platelets: 215 10*3/uL (ref 150–400)
RBC: 4.67 MIL/uL (ref 3.87–5.11)
RDW: 20.5 % — ABNORMAL HIGH (ref 11.5–15.5)
WBC: 6.6 10*3/uL (ref 4.0–10.5)
nRBC: 0 % (ref 0.0–0.2)

## 2023-02-06 LAB — MAGNESIUM: Magnesium: 1.7 mg/dL (ref 1.7–2.4)

## 2023-02-06 LAB — GLUCOSE, CAPILLARY
Glucose-Capillary: 123 mg/dL — ABNORMAL HIGH (ref 70–99)
Glucose-Capillary: 138 mg/dL — ABNORMAL HIGH (ref 70–99)
Glucose-Capillary: 152 mg/dL — ABNORMAL HIGH (ref 70–99)
Glucose-Capillary: 162 mg/dL — ABNORMAL HIGH (ref 70–99)
Glucose-Capillary: 281 mg/dL — ABNORMAL HIGH (ref 70–99)
Glucose-Capillary: 474 mg/dL — ABNORMAL HIGH (ref 70–99)
Glucose-Capillary: 482 mg/dL — ABNORMAL HIGH (ref 70–99)
Glucose-Capillary: 541 mg/dL (ref 70–99)
Glucose-Capillary: 61 mg/dL — ABNORMAL LOW (ref 70–99)
Glucose-Capillary: 62 mg/dL — ABNORMAL LOW (ref 70–99)
Glucose-Capillary: 82 mg/dL (ref 70–99)
Glucose-Capillary: 90 mg/dL (ref 70–99)

## 2023-02-06 LAB — HEPATITIS B SURFACE ANTIGEN: Hepatitis B Surface Ag: NONREACTIVE

## 2023-02-06 MED ORDER — ANTICOAGULANT SODIUM CITRATE 4% (200MG/5ML) IV SOLN: 5.0000 mL | Status: DC | PRN

## 2023-02-06 MED ORDER — HEPARIN SODIUM (PORCINE) 1000 UNIT/ML DIALYSIS: 1000.0000 [IU] | INTRAMUSCULAR | Status: DC | PRN

## 2023-02-06 MED ORDER — LIDOCAINE-PRILOCAINE 2.5-2.5 % EX CREA: 1.0000 | TOPICAL_CREAM | CUTANEOUS | Status: DC | PRN

## 2023-02-06 MED ORDER — ALTEPLASE 2 MG IJ SOLR: 2.0000 mg | Freq: Once | INTRAMUSCULAR | Status: DC | PRN

## 2023-02-06 MED ORDER — HEPARIN SODIUM (PORCINE) 1000 UNIT/ML DIALYSIS: 2000.0000 [IU] | Freq: Once | INTRAMUSCULAR | Status: DC

## 2023-02-06 MED ORDER — NEPRO/CARBSTEADY PO LIQD: 237.0000 mL | ORAL | Status: DC | PRN

## 2023-02-06 MED ORDER — LIDOCAINE HCL (PF) 1 % IJ SOLN: 5.0000 mL | INTRAMUSCULAR | Status: DC | PRN

## 2023-02-06 MED ORDER — PENTAFLUOROPROP-TETRAFLUOROETH EX AERO: 1.0000 | INHALATION_SPRAY | CUTANEOUS | Status: DC | PRN

## 2023-02-06 MED ORDER — DEXTROSE 50 % IV SOLN
25.0000 mL | INTRAVENOUS | Status: DC | PRN
  Administered 2023-02-08: 50 mL via INTRAVENOUS
  Filled 2023-02-06: qty 50

## 2023-02-06 MED ORDER — HEPARIN SODIUM (PORCINE) 1000 UNIT/ML IJ SOLN
3800.0000 [IU] | Freq: Once | INTRAMUSCULAR | Status: AC
  Administered 2023-02-06: 3800 [IU]
  Filled 2023-02-06: qty 4

## 2023-02-06 MED ORDER — GLUCOSE 40 % PO GEL
ORAL | Status: AC
  Administered 2023-02-06: 37.5 g
  Filled 2023-02-06: qty 1.21

## 2023-02-06 MED ORDER — INSULIN GLARGINE-YFGN 100 UNIT/ML ~~LOC~~ SOLN
18.0000 [IU] | SUBCUTANEOUS | Status: DC
  Administered 2023-02-06 – 2023-02-07 (×2): 18 [IU] via SUBCUTANEOUS
  Filled 2023-02-06 (×2): qty 0.18

## 2023-02-06 MED ORDER — DEXTROSE 50 % IV SOLN
INTRAVENOUS | Status: AC
  Administered 2023-02-06: 50 mL
  Filled 2023-02-06: qty 50

## 2023-02-06 MED ORDER — GLUCOSE 40 % PO GEL: 1.0000 | ORAL | Status: AC

## 2023-02-06 MED ORDER — MAGNESIUM SULFATE 2 GM/50ML IV SOLN
2.0000 g | Freq: Once | INTRAVENOUS | Status: AC
  Administered 2023-02-06: 2 g via INTRAVENOUS
  Filled 2023-02-06: qty 50

## 2023-02-06 NOTE — Evaluation (Signed)
Occupational Therapy Evaluation Patient Details Name: Susan [name] MRN: [id number] DOB: Sep 02, 1950 Today's Date: 02/06/2023   History of Present Illness 72 y.o. female presents to [facility] hospital on 01/20/2023 with nausea and vomiting. Pt was recently admitted 8/22-8/24 for similar symptoms, found to have proctocolitis as well as early DKA. PMH includes OA, CAD, chronic diastolic HF, depression, ESRD, HTN, HLD, CVA, DMII, R BKA.   Clinical Impression   PTA, pt recently back home and husband assisting with ADL and slide board transfers. Pt presenting below functional baseline; per prior notes min A for lateral scoot and requiring +2 max A this session. Pt requiring up to mod A for bed mobility as well. Pt limited by weakness, decreased balance, and poor endurance. Pt reports she has been unable to engage in [facility] therapies recently due to multiple hospital admissions, so hopeful that we can optimize services during this admission and recommending HHOT upon discharge.       If plan is discharge home, recommend the following: A lot of help with walking and/or transfers;A lot of help with bathing/dressing/bathroom;Assistance with cooking/housework;Help with stairs or ramp for entrance;Assist for transportation    Functional Status Assessment  Patient has had a recent decline in their functional status and demonstrates the ability to make significant improvements in function in a reasonable and predictable amount of time.  Equipment Recommendations  None recommended by OT    Recommendations for Other Services       Precautions / Restrictions Precautions Precautions: Fall Precaution Comments: R BKA, LLE wound Restrictions Weight Bearing Restrictions: No RLE Weight Bearing:  (BKA on R) Other Position/Activity Restrictions: per pt chart she reports Dr. Lajoyce Corners does not want her to put any weight on her LLE due to skin graft - sent message to Dr. Lajoyce Corners to clarify on 02/06/23 at 10:27 AM on 02/06/23 Dr.  Lajoyce Corners states pt is full WB      Mobility Bed Mobility Overal bed mobility: Needs Assistance Bed Mobility: Rolling, Sidelying to Sit, Sit to Supine Rolling: Mod assist, Max assist   Supine to sit: Mod assist Sit to supine: Mod assist   General bed mobility comments: Pt was Mod A for the trunk for supine to sitting and Mod A for LE and trunk stability for sitting to supine. Pt was total assist to scoot up in the bed.    Transfers Overall transfer level: Needs assistance Equipment used: None              Lateral/Scoot Transfers: Total assist, +2 physical assistance General transfer comment: Attempted to scoot laterally up the bed with Total assist. Pt initially demonstrates very little initiation and participation on 2nd and 3rd attempt improved body mechanics continues at total assist.      Balance Overall balance assessment: Needs assistance Sitting-balance support: No upper extremity supported, Feet supported, Bilateral upper extremity supported Sitting balance-Leahy Scale: Fair Sitting balance - Comments: CGA intermittently due to dizziness in sitting after sitting ~5 min                                   ADL either performed or assessed with clinical judgement   ADL Overall ADL's : Needs assistance/impaired Eating/Feeding: Modified independent   Grooming: Set up;Sitting   Upper Body Bathing: Set up   Lower Body Bathing: Total assistance   Upper Body Dressing : Set up   Lower Body Dressing: Total assistance   Toilet Transfer:  Total assistance;+2 for physical assistance Toilet Transfer Details (indicate cue type and reason): uses briefs           General ADL Comments: able to tolerate unsupported sitting EOB for ~8 minutes this session     Vision Patient Visual Report: No change from baseline Additional Comments: WFL for tasks assessed     Perception         Praxis         Pertinent Vitals/Pain Pain Assessment Pain Assessment:  Faces Faces Pain Scale: Hurts little more Pain Location: abdomen pt was nauseous Pain Descriptors / Indicators: Guarding, Grimacing Pain Intervention(s): Limited activity within patient's tolerance, Monitored during session     Extremity/Trunk Assessment Upper Extremity Assessment Upper Extremity Assessment: Generalized weakness (not formally assessed this session)   Lower Extremity Assessment Lower Extremity Assessment: Defer to PT evaluation   Cervical / Trunk Assessment Cervical / Trunk Assessment: Kyphotic   Communication Communication Communication: No apparent difficulties Cueing Techniques: Verbal cues   Cognition Arousal: Alert Behavior During Therapy: WFL for tasks assessed/performed Overall Cognitive Status: Within Functional Limits for tasks assessed                                       General Comments  HR and O2 WFL during session. R residual limb shrinker donned    Exercises     Shoulder Instructions      Home Living Family/patient expects to be discharged to:: Private residence Living Arrangements: Spouse/significant other Available Help at Discharge: Family;Available 24 hours/day Type of Home: Mobile home Home Access: Ramped entrance     Home Layout: One level     Bathroom Shower/Tub: Tub/shower unit;Door   Bathroom Toilet: Handicapped height Bathroom Accessibility: Yes   Home Equipment: Grab bars - tub/shower;Rolling Environmental consultant (2 wheels);Rollator (4 wheels);BSC/3in1;Cane - quad;Shower seat;Wheelchair - manual   Additional Comments: Husband was out on FMLA.Pt has recieved her RLE prosthesis.      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  Using brief to have BM and husband A with cleaning. Pt was asking about using BSC today but is unsure if her BSC is a drop arm BSC as she has been predominantly scooting with transfer board        OT Problem List: Impaired balance (sitting and/or standing);Impaired sensation;Pain      OT Treatment/Interventions: Self-care/ADL training;DME and/or AE instruction;Balance training;Patient/family education    OT Goals(Current goals can be found in the care plan section) Acute Rehab OT Goals Patient Stated Goal: get better OT Goal Formulation: With patient Time For Goal Achievement: 02/20/23 Potential to Achieve Goals: Good  OT Frequency: Min 1X/week    Co-evaluation   Reason for Co-Treatment: For patient/therapist safety;To address functional/ADL transfers;Other (comment) (pt was fatigued and nauseous unable to tolerate 2 sessions today) PT goals addressed during session: Mobility/safety with mobility        AM-PAC OT "6 Clicks" Daily Activity     Outcome Measure Help from another person eating meals?: None Help from another person taking care of personal grooming?: A Little Help from another person toileting, which includes using toliet, bedpan, or urinal?:  Total Help from another person bathing (including washing, rinsing, drying)?: A Lot Help from another person to put on and taking off regular upper body clothing?: A Little Help from another person to put on and taking off regular lower body clothing?: Total 6 Click Score: 14   End of Session Equipment Utilized During Treatment: Gait belt Nurse Communication: Mobility status  Activity Tolerance: Patient tolerated treatment well Patient left: in bed;with call bell/phone within reach;with bed alarm set  OT Visit Diagnosis: Other abnormalities of gait and mobility (R26.89);Muscle weakness (generalized) (M62.81);Pain Pain - part of body:  (stomach)                Time: 0932-1000 OT Time  Calculation (min): 28 min Charges:  OT General Charges $OT Visit: 1 Visit OT Evaluation $OT Eval Moderate Complexity: 1 Mod  Tyler Deis, OTR/L [facility] Office: [phone]   Susan [name] 02/06/2023, 10:41 AM

## 2023-02-06 NOTE — Progress Notes (Signed)
PROGRESS NOTE        PATIENT DETAILS Name: Susan [name] Age: 72 y.o. Sex: female Date of Birth: Nov 21, 1950 Admit Date: 02/05/2023 Admitting Physician Alan Mulder, MD [email or web address], Priscille Heidelberg, MD  Brief Summary: Patient is a 72 y.o.  female with history of ESRD on HD TTS, DM-2, PAF, CAD, HFrEF, HTN, PAD s/p right BKA-presented with nausea/vomiting in the setting of hyperosmolar hyperglycemic state.  Significant events: 9/12>> [facility]  Significant studies: None  Significant microbiology data: None  Procedures: None  Consults: Nephrology  Subjective: No vomiting-feels better but had episode of hypoglycemia earlier this morning.  Denies any abdominal pain.  Objective: Vitals: Blood pressure 130/72, pulse 76, temperature 98.1 F (36.7 C), temperature source Oral, resp. rate 18, height 5\' 5"  (1.651 m), weight 81 kg, SpO2 93%.   Exam: Gen Exam:Alert awake-not in any distress HEENT:atraumatic, normocephalic Chest: B/L clear to auscultation anteriorly CVS:S1S2 regular Abdomen:soft non tender, non distended Extremities:no edema-s/p right BKA Neurology: Non focal Skin: no rash  Pertinent Labs/Radiology:    Latest Ref Rng & Units 02/06/2023    7:26 AM 02/05/2023    1:51 PM 02/05/2023   11:14 AM  CBC  WBC 4.0 - 10.5 K/uL 6.6   6.9   Hemoglobin 12.0 - 15.0 g/dL 84.1  32.4  40.1   Hematocrit 36.0 - 46.0 % 35.9  41.0  40.4   Platelets 150 - 400 K/uL 215   237     Lab Results  Component Value Date   NA 131 (L) 02/06/2023   K 3.6 02/06/2023   CL 95 (L) 02/06/2023   CO2 21 (L) 02/06/2023      Assessment/Plan: Hyperosmolar hyperglycemic state Required insulin infusion-has been transitioned to SQ insulin over night  DM-2 with uncontrolled hyperglycemia (A1c 7.9 on 4/3) Hypoglycemic event earlier this morning Semglee reduced to 18 units (received 26 units last night) Continue SSI Follow and adjust  Nausea/vomiting Unclear  etiology-recently had similar issues but that was attributed to colitis Currently without any abdominal pain Possible underlying gastroparesis-starting scheduled Reglan for now and seeing how she does.  Recommended small portion meals. If vomiting persist-May need repeat abdominal imaging.  Asymptomatic bacteriuria UA dirty but no symptoms-makes very little urine Stop Cefepime-UTI ruled out.  Chronic HFrEF Volume removal with HD  CAD No anginal symptoms Blocker/statin Suspect not on ASA due to being on Eliquis.  PAF Telemetry monitoring Coreg/amiodarone Eliquis  ESRD on HD TTS Nephrology following  Normocytic anemia Secondary to CKD Follow CBC and transfuse if significant drop  PAD-s/p right BKA  Depression/anxiety Relatively stable Effexor/Remeron/Klonopin  Hypothyroidism Synthroid TSH on 8/22 stable  Debility/deconditioning PT/OT eval  BMI: Estimated body mass index is 29.72 kg/m as calculated from the following:   Height as of this encounter: 5\' 5"  (1.651 m).   Weight as of this encounter: 81 kg.   Code status:   Code Status: Full Code   DVT Prophylaxis: SCDs Start: 02/05/23 1719 apixaban (ELIQUIS) tablet 5 mg    Family Communication: None at bedside   Disposition Plan: Status is: Inpatient Remains inpatient appropriate because:    Planned Discharge Destination:Home   Diet: Diet Order             Diet renal/carb modified with fluid restriction Diet-HS Snack? Nothing; Fluid restriction: 1200 mL Fluid; Room service appropriate? Yes; Fluid consistency: Thin  Diet effective now                     Antimicrobial agents: Anti-infectives (From admission, onward)    Start     Dose/Rate Route Frequency Ordered Stop   02/06/23 1600  ceFEPIme (MAXIPIME) 1 g in sodium chloride 0.9 % 100 mL IVPB        1 g 200 mL/hr over 30 Minutes Intravenous Every 24 hours 02/05/23 2044     02/05/23 2130  ceFEPIme (MAXIPIME) 1 g in sodium chloride 0.9 %  100 mL IVPB  Status:  Discontinued        1 g 200 mL/hr over 30 Minutes Intravenous Every 24 hours 02/05/23 2041 02/05/23 2044   02/05/23 1630  ceFEPIme (MAXIPIME) 2 g in sodium chloride 0.9 % 100 mL IVPB        2 g 200 mL/hr over 30 Minutes Intravenous  Once 02/05/23 1625 02/05/23 1737        MEDICATIONS: Scheduled Meds:  amiodarone  200 mg Oral Daily   apixaban  5 mg Oral BID   atorvastatin  80 mg Oral Daily   [START ON 02/07/2023] calcitRIOL  0.75 mcg Oral Q T,Th,Sa-HD   carvedilol  6.25 mg Oral BID WC   Chlorhexidine Gluconate Cloth  6 each Topical Q0600   clonazePAM  0.5 mg Oral BID   dextrose  1 Tube Oral STAT   ferric citrate  420 mg Oral TID WC   insulin aspart  0-9 Units Subcutaneous TID WC   insulin aspart  2 Units Subcutaneous TID WC   insulin glargine-yfgn  18 Units Subcutaneous Q24H   levothyroxine  75 mcg Oral Q0600   mirtazapine  7.5 mg Oral QHS   pantoprazole  40 mg Oral BID AC   venlafaxine XR  75 mg Oral Q breakfast   Continuous Infusions:  ceFEPime (MAXIPIME) IV     PRN Meds:.acetaminophen **OR** acetaminophen, dextrose, methocarbamol, midodrine, ondansetron **OR** ondansetron (ZOFRAN) IV, oxyCODONE   I have personally reviewed following labs and imaging studies  LABORATORY DATA: CBC: Recent Labs  Lab 01/31/23 0711 02/01/23 0701 02/02/23 0516 02/05/23 1114 02/05/23 1351 02/06/23 0726  WBC 6.8 7.5 5.8 6.9  --  6.6  NEUTROABS 5.4 4.8  --   --   --   --   HGB 12.1 11.4* 11.1* 11.7* 13.9 10.6*  HCT 40.5 38.5 36.5 40.4 41.0 35.9*  MCV 77.9* 79.1* 77.5* 77.7*  --  76.9*  PLT 238 270 207 237  --  215    Basic Metabolic Panel: Recent Labs  Lab 01/31/23 0711 02/01/23 0701 02/02/23 0516 02/05/23 1114 02/05/23 1342 02/05/23 1351 02/05/23 1543 02/05/23 2151 02/06/23 0726  NA 132* 134* 132* 127*  --  127* 127* 128* 131*  K 3.7 3.5 3.7 4.3  --  4.1 4.1 3.4* 3.6  CL 95* 97* 93* 89*  --   --  89* 92* 95*  CO2 25 25 25 23   --   --  22 22 21*   GLUCOSE 247* 160* 106* 688*  --   --  593* 228* 78  BUN 45* 28* 43* 48*  --   --  45* 49* 53*  CREATININE 4.30* 3.71* 5.03* 4.56* 4.54*  --  4.34* 4.39* 4.62*  CALCIUM 9.5 9.3 9.4 9.8  --   --  9.5 9.2 9.3  MG  --  1.7 1.7  --   --   --   --   --  1.7  PHOS 2.0* 2.5 2.5  --   --   --  3.3  --  3.7    GFR: Estimated Creatinine Clearance: 11.4 mL/min (A) (by C-G formula based on SCr of 4.62 mg/dL (H)).  Liver Function Tests: Recent Labs  Lab 01/31/23 0711 02/01/23 0701 02/05/23 1114 02/06/23 0726  AST  --  18 21  --   ALT  --  14 21  --   ALKPHOS  --  148* 222*  --   BILITOT  --  0.3 0.7  --   PROT  --  6.3* 7.4  --   ALBUMIN 3.2* 3.0* 3.5 3.0*   Recent Labs  Lab 02/05/23 1114  LIPASE 23   No results for input(s): "AMMONIA" in the last 168 hours.  Coagulation Profile: No results for input(s): "INR", "PROTIME" in the last 168 hours.  Cardiac Enzymes: No results for input(s): "CKTOTAL", "CKMB", "CKMBINDEX", "TROPONINI" in the last 168 hours.  BNP (last 3 results) No results for input(s): "PROBNP" in the last 8760 hours.  Lipid Profile: No results for input(s): "CHOL", "HDL", "LDLCALC", "TRIG", "CHOLHDL", "LDLDIRECT" in the last 72 hours.  Thyroid Function Tests: No results for input(s): "TSH", "T4TOTAL", "FREET4", "T3FREE", "THYROIDAB" in the last 72 hours.  Anemia Panel: No results for input(s): "VITAMINB12", "FOLATE", "FERRITIN", "TIBC", "IRON", "RETICCTPCT" in the last 72 hours.  Urine analysis:    Component Value Date/Time   COLORURINE AMBER (A) 02/05/2023 1418   APPEARANCEUR TURBID (A) 02/05/2023 1418   LABSPEC 1.015 02/05/2023 1418   PHURINE 6.0 02/05/2023 1418   GLUCOSEU >=500 (A) 02/05/2023 1418   HGBUR MODERATE (A) 02/05/2023 1418   BILIRUBINUR NEGATIVE 02/05/2023 1418   BILIRUBINUR negative 06/05/2020 1334   KETONESUR 5 (A) 02/05/2023 1418   PROTEINUR 100 (A) 02/05/2023 1418   UROBILINOGEN 0.2 06/05/2020 1334   UROBILINOGEN 0.2 04/01/2014 1659    NITRITE NEGATIVE 02/05/2023 1418   LEUKOCYTESUR LARGE (A) 02/05/2023 1418    Sepsis Labs: Lactic Acid, Venous    Component Value Date/Time   LATICACIDVEN 0.9 11/20/2022 0116    MICROBIOLOGY: No results found for this or any previous visit (from the past 240 hour(s)).  RADIOLOGY STUDIES/RESULTS: No results found.   LOS: 1 day   Jeoffrey Massed, MD  [facility]    To contact the attending provider between 7A-7P or the covering provider during after hours 7P-7A, please log into the web site [email or web address] and access using universal [place] password for that web site. If you do not have the password, please call the hospital operator.  02/06/2023, 9:14 AM

## 2023-02-06 NOTE — Progress Notes (Signed)
Received patient in bed to unit.  Alert and oriented.  Informed consent signed and in chart.   TX duration:4 hours  Patient tolerated well.  Transported back to the room  Alert, without acute distress.  Hand-off given to patient's nurse.   Access used: R HD Cath Access issues: none  Total UF removed: Medication(s) given: Zofran and Tylenol   02/06/23 1718  Vitals  Temp 97.7 F (36.5 C)  Temp Source Oral  BP (!) 127/107  MAP (mmHg) 115  BP Location Right Wrist  BP Method Automatic  Patient Position (if appropriate) Lying  Pulse Rate 81  Pulse Rate Source Monitor  ECG Heart Rate 82  Resp 16  Level of Consciousness  Level of Consciousness Alert  MEWS COLOR  MEWS Score Color Green  Oxygen Therapy  SpO2 100 %  O2 Device Nasal Cannula  O2 Flow Rate (L/min) 2 L/min  Pain Assessment  Pain Scale 0-10  Pain Score 8  Pain Type Acute pain  Pain Location Abdomen  Pain Descriptors / Indicators Aching  Pain Frequency Constant  Pain Onset Gradual  Pain Intervention(s) Refused  PCA/Epidural/Spinal Assessment  Respiratory Pattern Regular  ECG Monitoring  Cardiac Rhythm NSR  MEWS Score  MEWS Temp 0  MEWS Systolic 0  MEWS Pulse 0  MEWS RR 0  MEWS LOC 0  MEWS Score 0     Stacie Glaze LPN [facility]

## 2023-02-06 NOTE — Evaluation (Signed)
Physical Therapy Evaluation Patient Details Name: Susan [name] MRN: [id number] DOB: Jul 03, 1950 Today's Date: 02/06/2023  History of Present Illness  72 y.o. female presents to [facility] hospital on 01/20/2023 with nausea and vomiting. Pt was recently admitted 8/22-8/24 for similar symptoms, found to have proctocolitis as well as early DKA. PMH includes OA, CAD, chronic diastolic HF, depression, ESRD, HTN, HLD, CVA, DMII, R BKA.  Clinical Impression  Pt is presenting below baseline level of function. Per last physical therapy note pt was Min A for lateral transfers and no physical assist required for bed mobility. Currently pt is Mod a for bed mobility and Total assist for lateral scooting. Due to pt current functional status, home set up and available assistance at home recommending skilled physical therapy services 3x/weekly on discharge from acute care hospital setting in order to address strength, balance and overall functional mobility to decrease risk for falls, injury and re-hospitalization.          If plan is discharge home, recommend the following: A lot of help with walking and/or transfers;Assistance with cooking/housework;Assist for transportation;Help with stairs or ramp for entrance     Equipment Recommendations None recommended by PT     Functional Status Assessment Patient has had a recent decline in their functional status and demonstrates the ability to make significant improvements in function in a reasonable and predictable amount of time.     Precautions / Restrictions Precautions Precautions: Fall Precaution Comments: R BKA, LLE wound Restrictions Weight Bearing Restrictions: No RLE Weight Bearing:  (BKA) Other Position/Activity Restrictions: per pt chart she reports Dr. Lajoyce Corners does not want her to put any weight on her LLE due to skin graft - sent message to Dr. Lajoyce Corners to clarify on 02/06/23 at 10:27 AM on 02/06/23 Dr. Lajoyce Corners states pt is full WB      Mobility  Bed  Mobility Overal bed mobility: Needs Assistance Bed Mobility: Rolling, Sidelying to Sit, Sit to Supine Rolling: Mod assist, Max assist   Supine to sit: Mod assist Sit to supine: Mod assist   General bed mobility comments: Pt was Mod A for the trunk for supine to sitting and Mod A for LE and trunk stability for sitting to supine. Pt was total assist to scoot up in the bed.    Transfers Overall transfer level: Needs assistance Equipment used: None              Lateral/Scoot Transfers: Total assist, +2 physical assistance General transfer comment: Attempted to scoot laterally up the bed with Total assist. Pt initially demonstrates very little initiation and participation on 2nd and 3rd attempt improved body mechanics continues at total assist.    Ambulation/Gait     General Gait Details: unable at this time.      Balance Overall balance assessment: Needs assistance Sitting-balance support: No upper extremity supported, Feet supported, Bilateral upper extremity supported Sitting balance-Leahy Scale: Fair Sitting balance - Comments: CGA intermittently due to dizziness in sitting after sitting ~5 min         Pertinent Vitals/Pain Pain Assessment Pain Assessment: Faces Faces Pain Scale: Hurts little more Pain Location: abdomen pt was nauseous Pain Descriptors / Indicators: Guarding, Grimacing Pain Intervention(s): Monitored during session    Home Living Family/patient expects to be discharged to:: Private residence Living Arrangements: Spouse/significant other Available Help at Discharge: Family;Available 24 hours/day Type of Home: Mobile home Home Access: Ramped entrance       Home Layout: One level Home Equipment: Grab bars - tub/shower;Rolling  Walker (2 wheels);Rollator (4 wheels);BSC/3in1;Cane - quad;Shower seat;Wheelchair - manual Additional Comments: Husband was out on FMLA.Pt has recieved her RLE prosthesis.    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  Using brief to have BM and husband A with cleaning. Pt was asking about using BSC today     Extremity/Trunk Assessment   Upper Extremity Assessment Upper Extremity Assessment: Defer to OT evaluation    Lower Extremity Assessment Lower Extremity Assessment: Generalized weakness    Cervical / Trunk Assessment Cervical / Trunk Assessment: Kyphotic  Communication   Communication Communication: No apparent difficulties Cueing Techniques: Verbal cues  Cognition Arousal: Alert Behavior During Therapy: WFL for tasks assessed/performed Overall Cognitive Status: Within Functional Limits for tasks assessed        General Comments General comments (skin integrity, edema, etc.): Hr and O2 sats remained WNL throughout session. R shrinker was donned        Assessment/Plan    PT Assessment Patient needs continued PT services  PT Problem List Decreased strength;Decreased activity tolerance;Decreased balance;Decreased mobility       PT Treatment Interventions DME instruction;Functional mobility training;Therapeutic activities;Therapeutic exercise;Balance training;Neuromuscular re-education;Patient/family education;Wheelchair mobility training    PT Goals (Current goals can be found in the Care Plan section)  Acute Rehab PT Goals Patient Stated Goal: Return home with HHPT PT Goal Formulation: With patient Time For Goal Achievement: 02/20/23 Potential to Achieve Goals: Good Additional Goals Additional Goal #1: Pt will use W/C with bil UE/bil LE technique or a variety for home distances ~50 ft at Mod I with assistance managing W/C parts    Frequency Min 1X/week     Co-evaluation  PT/OT/SLP Co-Evaluation/Treatment: Yes Reason for Co-Treatment: For patient/therapist safety;To address functional/ADL transfers;Other (comment) (pt was fatigued and nauseous unable to tolerate 2 sessions today) PT goals addressed during session: Mobility/safety with mobility         AM-PAC PT "6 Clicks" Mobility  Outcome Measure Help needed turning from your back to your side while in a flat bed without using bedrails?: A Lot Help needed moving from lying on your back to sitting on the side of a flat bed without using bedrails?: A Lot Help needed moving to and from a bed to a chair (including a wheelchair)?: Total Help needed standing up from a chair using your arms (e.g., wheelchair or bedside chair)?: Total Help needed to walk in hospital room?: Total Help needed climbing 3-5 steps with a railing? : Total 6 Click Score: 8    End of Session Equipment Utilized During Treatment: Gait belt Activity Tolerance: Patient tolerated treatment well Patient left: in bed;with call bell/phone within reach;with bed alarm set Nurse Communication: Mobility status PT Visit Diagnosis: Other abnormalities of gait and mobility (R26.89);Muscle weakness (generalized) (M62.81)    Time: 0931-1000 PT Time Calculation (min) (ACUTE ONLY): 29 min   Charges:   PT Evaluation $PT Eval Low Complexity: 1 Low   PT General Charges $$ ACUTE PT VISIT: 1 Visit        Harrel Carina, DPT, CLT  Acute Rehabilitation Services Office: [phone] (Secure chat preferred)   Claudia Desanctis 02/06/2023, 10:34 AM

## 2023-02-06 NOTE — Progress Notes (Signed)
Hypoglycemic Event  CBG: 61mg /dl at 41:32 am   Treatment: 1 tube glucose gel,15 min CBG 62mg /dl.Then did 50% dextrose 25g since patient is not willing to eat.  Symptoms: Pale,drowsey  Follow-up CBG: Time:03:57 am CBG Result:152 mg/dl  Possible Reasons for Event: Inadequate meal intake,medicine regimen  Comments/MD notified:Dr.Gardner    Tonna Boehringer

## 2023-02-06 NOTE — Progress Notes (Signed)
Pt receives out-pt HD at [facility] on TTS. Will assist as needed.   Olivia Canter Renal Navigator [phone]

## 2023-02-06 NOTE — Progress Notes (Signed)
[facility] Progress Note   Subjective:  Seen in room - emesis bag in hand, ongoing nausea and vomiting today. Breakfast tray at bedside, looks like ate about half of it. BS better today. No CP/dyspnea at the moment. Last HD Tuesday - she is on our schedule for HD today.  Objective Vitals:   02/05/23 1921 02/06/23 0038 02/06/23 0630 02/06/23 0800  BP: (!) 163/77 (!) 110/51 130/72   Pulse: 86 71 76   Resp: 17 15 18    Temp:  98.3 F (36.8 C) 98.1 F (36.7 C) 98 F (36.7 C)  TempSrc:  Oral Oral Oral  SpO2: 100% 100% 93%   Weight:      Height:       Physical Exam General: Chronically ill appearing woman, NAD. Nasal O2 in place. Heart: RRR; no murmur Lungs: CTA anteriorly Abdomen: soft, non-tender Extremities: R BKA, trace LLE edema Dialysis Access: [facility]  Additional Objective Labs: Basic Metabolic Panel: Recent Labs  Lab 02/02/23 0516 02/05/23 1114 02/05/23 1543 02/05/23 2151 02/06/23 0726  NA 132*   < > 127* 128* 131*  K 3.7   < > 4.1 3.4* 3.6  CL 93*   < > 89* 92* 95*  CO2 25   < > 22 22 21*  GLUCOSE 106*   < > 593* 228* 78  BUN 43*   < > 45* 49* 53*  CREATININE 5.03*   < > 4.34* 4.39* 4.62*  CALCIUM 9.4   < > 9.5 9.2 9.3  PHOS 2.5  --  3.3  --  3.7   < > = values in this interval not displayed.   Liver Function Tests: Recent Labs  Lab 02/01/23 0701 02/05/23 1114 02/06/23 0726  AST 18 21  --   ALT 14 21  --   ALKPHOS 148* 222*  --   BILITOT 0.3 0.7  --   PROT 6.3* 7.4  --   ALBUMIN 3.0* 3.5 3.0*   Recent Labs  Lab 02/05/23 1114  LIPASE 23   CBC: Recent Labs  Lab 01/31/23 0711 02/01/23 0701 02/02/23 0516 02/05/23 1114 02/05/23 1351 02/06/23 0726  WBC 6.8 7.5 5.8 6.9  --  6.6  NEUTROABS 5.4 4.8  --   --   --   --   HGB 12.1 11.4* 11.1* 11.7* 13.9 10.6*  HCT 40.5 38.5 36.5 40.4 41.0 35.9*  MCV 77.9* 79.1* 77.5* 77.7*  --  76.9*  PLT 238 270 207 237  --  215   CBG: Recent Labs  Lab 02/06/23 0202 02/06/23 0255  02/06/23 0322 02/06/23 0357 02/06/23 0818  GLUCAP 90 61* 62* 152* 82   Medications:  magnesium sulfate bolus IVPB      amiodarone  200 mg Oral Daily   apixaban  5 mg Oral BID   atorvastatin  80 mg Oral Daily   [START ON 02/07/2023] calcitRIOL  0.75 mcg Oral Q T,Th,Sa-HD   carvedilol  6.25 mg Oral BID WC   Chlorhexidine Gluconate Cloth  6 each Topical Q0600   clonazePAM  0.5 mg Oral BID   dextrose  1 Tube Oral STAT   ferric citrate  420 mg Oral TID WC   insulin aspart  0-9 Units Subcutaneous TID WC   insulin aspart  2 Units Subcutaneous TID WC   insulin glargine-yfgn  18 Units Subcutaneous Q24H   levothyroxine  75 mcg Oral Q0600   mirtazapine  7.5 mg Oral QHS   pantoprazole  40 mg Oral BID AC  venlafaxine XR  75 mg Oral Q breakfast    Dialysis Orders: TTS [facility] 4hr, 400/800, EDW 79kg, 2K/2Ca bath, TDC, Heparin 4000 + 2000 midrun - Aranesp given 9/3 during recent admit - Calcitriol 0.50mcg PO q HD - Binder recently changed from renvela to [place] Just discharged 02/02/2023  Assessment/Plan: 1. Intractable nausea/vomiting: Just discharged with same, readmitted 9/12. Ongoing symptoms. ?Felt due to colitis and/or gastroparesis, uncontrolled BS on admit - improved. Looks like plan is to start metoclopramide. Consider GI input if doesn't resolve. 2. ESRD: Usual TTS schedule - missed yesterday's HD -> HD today (Friday), then again Sat to get back on her usual days. 3. HTN/volume: BP variable, does have edema on exam as well as hypoNa. UF as tolerated. 4. Anemia: Hgb 10.6 - follow without ESA for now. 5. Secondary hyperparathyroidism: CorrCa a little high, Phos fine - continue binders and VDRA - follow labs, may need to hold. 6. Nutrition: Alb low, will add supps once able to take PO consistently. 7. T2DM: BS > 600 on admit - better with insulin. 8. CAD 9. pAF: On Eliquis   Lily Peer 02/06/2023, 11:01 AM  [employer]

## 2023-02-07 ENCOUNTER — Inpatient Hospital Stay (HOSPITAL_COMMUNITY): Payer: PPO

## 2023-02-07 DIAGNOSIS — I5020 Unspecified systolic (congestive) heart failure: Secondary | ICD-10-CM | POA: Diagnosis not present

## 2023-02-07 DIAGNOSIS — N186 End stage renal disease: Secondary | ICD-10-CM | POA: Diagnosis not present

## 2023-02-07 DIAGNOSIS — I480 Paroxysmal atrial fibrillation: Secondary | ICD-10-CM | POA: Diagnosis not present

## 2023-02-07 DIAGNOSIS — E1100 Type 2 diabetes mellitus with hyperosmolarity without nonketotic hyperglycemic-hyperosmolar coma (NKHHC): Secondary | ICD-10-CM | POA: Diagnosis not present

## 2023-02-07 LAB — CBC WITH DIFFERENTIAL/PLATELET
Abs Immature Granulocytes: 0.02 10*3/uL (ref 0.00–0.07)
Basophils Absolute: 0.1 10*3/uL (ref 0.0–0.1)
Basophils Relative: 1 %
Eosinophils Absolute: 0.3 10*3/uL (ref 0.0–0.5)
Eosinophils Relative: 5 %
HCT: 35 % — ABNORMAL LOW (ref 36.0–46.0)
Hemoglobin: 10.8 g/dL — ABNORMAL LOW (ref 12.0–15.0)
Immature Granulocytes: 0 %
Lymphocytes Relative: 11 %
Lymphs Abs: 0.6 10*3/uL — ABNORMAL LOW (ref 0.7–4.0)
MCH: 23.8 pg — ABNORMAL LOW (ref 26.0–34.0)
MCHC: 30.9 g/dL (ref 30.0–36.0)
MCV: 77.3 fL — ABNORMAL LOW (ref 80.0–100.0)
Monocytes Absolute: 0.6 10*3/uL (ref 0.1–1.0)
Monocytes Relative: 10 %
Neutro Abs: 4.2 10*3/uL (ref 1.7–7.7)
Neutrophils Relative %: 73 %
Platelets: 221 10*3/uL (ref 150–400)
RBC: 4.53 MIL/uL (ref 3.87–5.11)
RDW: 20.3 % — ABNORMAL HIGH (ref 11.5–15.5)
WBC: 5.8 10*3/uL (ref 4.0–10.5)
nRBC: 0 % (ref 0.0–0.2)

## 2023-02-07 LAB — GLUCOSE, CAPILLARY
Glucose-Capillary: 194 mg/dL — ABNORMAL HIGH (ref 70–99)
Glucose-Capillary: 178 mg/dL — ABNORMAL HIGH (ref 70–99)
Glucose-Capillary: 121 mg/dL — ABNORMAL HIGH (ref 70–99)

## 2023-02-07 LAB — HEPATITIS B SURFACE ANTIBODY, QUANTITATIVE: Hep B S AB Quant (Post): 1959 m[IU]/mL

## 2023-02-07 LAB — MAGNESIUM: Magnesium: 1.9 mg/dL (ref 1.7–2.4)

## 2023-02-07 MED ORDER — POLYETHYLENE GLYCOL 3350 17 G PO PACK
17.0000 g | PACK | Freq: Every day | ORAL | Status: DC
  Administered 2023-02-15 – 2023-02-16 (×2): 17 g via ORAL
  Filled 2023-02-07 (×5): qty 1

## 2023-02-07 MED ORDER — PANTOPRAZOLE SODIUM 40 MG IV SOLR
40.0000 mg | Freq: Two times a day (BID) | INTRAVENOUS | Status: DC
  Administered 2023-02-07 – 2023-02-09 (×4): 40 mg via INTRAVENOUS
  Filled 2023-02-07 (×4): qty 10

## 2023-02-07 MED ORDER — METOCLOPRAMIDE HCL 5 MG/ML IJ SOLN
5.0000 mg | Freq: Three times a day (TID) | INTRAMUSCULAR | Status: AC
  Administered 2023-02-07 – 2023-02-08 (×3): 5 mg via INTRAVENOUS
  Filled 2023-02-07: qty 1
  Filled 2023-02-07 (×2): qty 2

## 2023-02-07 MED ORDER — POTASSIUM CHLORIDE CRYS ER 20 MEQ PO TBCR: 40.0000 meq | EXTENDED_RELEASE_TABLET | Freq: Once | ORAL | Status: DC

## 2023-02-07 MED ORDER — TRIMETHOBENZAMIDE HCL 100 MG/ML IM SOLN
200.0000 mg | Freq: Three times a day (TID) | INTRAMUSCULAR | Status: DC | PRN
  Administered 2023-02-07 – 2023-02-16 (×7): 200 mg via INTRAMUSCULAR
  Filled 2023-02-07 (×9): qty 2

## 2023-02-07 MED ORDER — HEPARIN SODIUM (PORCINE) 1000 UNIT/ML IJ SOLN
2000.0000 [IU] | Freq: Once | INTRAMUSCULAR | Status: AC
  Administered 2023-02-07: 2000 [IU] via INTRAVENOUS

## 2023-02-07 MED ORDER — IOHEXOL 350 MG/ML SOLN
75.0000 mL | Freq: Once | INTRAVENOUS | Status: AC | PRN
  Administered 2023-02-07: 75 mL via INTRAVENOUS

## 2023-02-07 MED ORDER — HEPARIN SODIUM (PORCINE) 1000 UNIT/ML DIALYSIS
1000.0000 [IU] | INTRAMUSCULAR | Status: DC | PRN
  Administered 2023-02-07 (×2): 1900 [IU]
  Filled 2023-02-07: qty 1

## 2023-02-07 MED ORDER — BISACODYL 10 MG RE SUPP: 10.0000 mg | Freq: Every day | RECTAL | Status: DC | PRN

## 2023-02-07 MED ORDER — MAGNESIUM SULFATE 2 GM/50ML IV SOLN
2.0000 g | Freq: Once | INTRAVENOUS | Status: AC
  Administered 2023-02-07: 2 g via INTRAVENOUS
  Filled 2023-02-07: qty 50

## 2023-02-07 NOTE — Plan of Care (Signed)

## 2023-02-07 NOTE — Plan of Care (Signed)
  Problem: Activity: Goal: Ability to return to baseline activity level will improve Outcome: Progressing   Problem: Education: Goal: Knowledge of General Education information will improve Description: Including pain rating scale, medication(s)/side effects and non-pharmacologic comfort measures Outcome: Progressing   Problem: Clinical Measurements: Goal: Ability to maintain clinical measurements within normal limits will improve Outcome: Progressing   Problem: Safety: Goal: Ability to remain free from injury will improve Outcome: Progressing   Problem: Skin Integrity: Goal: Risk for impaired skin integrity will decrease Outcome: Progressing

## 2023-02-07 NOTE — Procedures (Signed)
I was present at this dialysis session. I have reviewed the session itself and made appropriate changes.   Still with N/V.  Seen on HD.  1.5L UF goa, 3K bath.  Move to 4K given poor PO.    Filed Weights   02/05/23 1302 02/06/23 1258 02/06/23 1736  Weight: 81 kg 80.7 kg 78.1 kg    Recent Labs  Lab 02/07/23 0417  NA 132*  K 3.1*  CL 95*  CO2 27  GLUCOSE 259*  BUN 20  CREATININE 2.90*  CALCIUM 8.4*  PHOS 2.4*    Recent Labs  Lab 02/01/23 0701 02/02/23 0516 02/05/23 1114 02/05/23 1351 02/06/23 0726 02/07/23 0810  WBC 7.5   < > 6.9  --  6.6 5.8  NEUTROABS 4.8  --   --   --   --  4.2  HGB 11.4*   < > 11.7* 13.9 10.6* 10.8*  HCT 38.5   < > 40.4 41.0 35.9* 35.0*  MCV 79.1*   < > 77.7*  --  76.9* 77.3*  PLT 270   < > 237  --  215 221   < > = values in this interval not displayed.    Scheduled Meds:  amiodarone  200 mg Oral Daily   apixaban  5 mg Oral BID   atorvastatin  80 mg Oral Daily   calcitRIOL  0.75 mcg Oral Q T,Th,Sa-HD   carvedilol  6.25 mg Oral BID WC   Chlorhexidine Gluconate Cloth  6 each Topical Q0600   clonazePAM  0.5 mg Oral BID   ferric citrate  420 mg Oral TID WC   insulin aspart  0-9 Units Subcutaneous TID WC   insulin aspart  2 Units Subcutaneous TID WC   insulin glargine-yfgn  18 Units Subcutaneous Q24H   levothyroxine  75 mcg Oral Q0600   metoCLOPramide (REGLAN) injection  5 mg Intravenous TID AC   mirtazapine  7.5 mg Oral QHS   pantoprazole  40 mg Oral BID AC   potassium chloride  40 mEq Oral Once   venlafaxine XR  75 mg Oral Q breakfast   Continuous Infusions:  magnesium sulfate bolus IVPB     PRN Meds:.acetaminophen **OR** acetaminophen, dextrose, heparin, methocarbamol, midodrine, ondansetron **OR** ondansetron (ZOFRAN) IV, oxyCODONE   Sabra Heck  MD 02/07/2023, 8:50 AM

## 2023-02-07 NOTE — Progress Notes (Signed)
HD Progress Note:   02/07/23 1145  Vitals  Temp 97.6 F (36.4 C)  Temp Source Oral  BP (!) 159/87  MAP (mmHg) 108  BP Location Right Wrist  BP Method Automatic  Patient Position (if appropriate) Lying  Pulse Rate 82  Pulse Rate Source Monitor  Resp (!) 22  Oxygen Therapy  SpO2 100 %  O2 Device Nasal Cannula  During Treatment Monitoring  Blood Flow Rate (mL/min) 399 mL/min  Arterial Pressure (mmHg) -236.75 mmHg  Venous Pressure (mmHg) 182.01 mmHg  TMP (mmHg) -0.8 mmHg  Ultrafiltration Rate (mL/min) 685 mL/min  Dialysate Flow Rate (mL/min) 300 ml/min  Dialysate Potassium Concentration 4  Dialysate Calcium Concentration 2.25  Duration of HD Treatment -hour(s) 3.49 hour(s)  Cumulative Fluid Removed (mL) per Treatment  1495.27  HD Safety Checks Performed Yes  Intra-Hemodialysis Comments Tx completed  Post Treatment  Dialyzer Clearance Lightly streaked  Hemodialysis Intake (mL) 0 mL  Liters Processed 84  Fluid Removed (mL) 1500 mL  Tolerated HD Treatment Yes  Post-Hemodialysis Comments No issues with her BP today, it's on the higher side.  Hemodialysis Catheter Right Subclavian  Placement Date: 06/06/22   Placed prior to admission: Yes  Orientation: Right  Access Location: Subclavian  Site Condition No complications  Blue Lumen Status Flushed;Heparin locked;Dead end cap in place  Red Lumen Status Flushed;Heparin locked;Dead end cap in place  Purple Lumen Status N/A  Catheter fill solution Heparin 1000 units/ml  Catheter fill volume (Arterial) 1.9 cc  Catheter fill volume (Venous) 1.9  Dressing Type Transparent  Dressing Status Antimicrobial disc in place  Interventions Dressing reinforced  Drainage Description None  Dressing Change Due 02/13/23  Post treatment catheter status Capped and Clamped

## 2023-02-07 NOTE — Progress Notes (Signed)
PROGRESS NOTE        PATIENT DETAILS Name: Susan [name] Age: 72 y.o. Sex: female Date of Birth: [birth year] Admit Date: 02/05/2023 Admitting Physician Alan Mulder, MD RUE:[id number], Priscille Heidelberg, MD  Brief Summary: Patient is a 72 y.o.  female with history of ESRD on HD TTS, DM-2, PAF, CAD, HFrEF, HTN, PAD s/p right BKA-presented with nausea/vomiting in the setting of hyperosmolar hyperglycemic state.  Significant events: 9/12>> admit to [facility]  Significant studies: None  Significant microbiology data: None  Procedures: None  Consults: Nephrology  Subjective: Had some vomiting last night-still nauseous.  Objective: Vitals: Blood pressure (!) 147/117, pulse 81, temperature 97.8 F (36.6 C), temperature source Oral, resp. rate 14, height 5\' 5"  (1.651 m), weight 78.1 kg, SpO2 100%.   Exam: Gen Exam:Alert awake-not in any distress HEENT:atraumatic, normocephalic Chest: B/L clear to auscultation anteriorly CVS:S1S2 regular Abdomen:soft non tender, non distended Extremities:no edema-s/p right BKA Neurology: Non focal Skin: no rash  Pertinent Labs/Radiology:    Latest Ref Rng & Units 02/07/2023    8:10 AM 02/06/2023    7:26 AM 02/05/2023    1:51 PM  CBC  WBC 4.0 - 10.5 K/uL 5.8  6.6    Hemoglobin 12.0 - 15.0 g/dL 19.1  47.8  29.5   Hematocrit 36.0 - 46.0 % 35.0  35.9  41.0   Platelets 150 - 400 K/uL 221  215      Lab Results  Component Value Date   NA 132 (L) 02/07/2023   K 3.1 (L) 02/07/2023   CL 95 (L) 02/07/2023   CO2 27 02/07/2023      Assessment/Plan: Hyperosmolar hyperglycemic state Required insulin infusion-has been transitioned to SQ insulin over night  DM-2 with uncontrolled hyperglycemia (A1c 7.9 on 4/3) CBGs relatively stable-continue 18 units of Semglee/SSI  Continues to have some vomiting-hence will allow some amount of permissive hyperglycemia.    Nausea/vomiting Possible gastroparesis-recent hospitalization for  similar but had colitis then.   No significant abdominal pain this morning  Will start on low-dose IV Reglan-encourage small portion meals and see how she does.  If she continues to have persistent vomiting-needs CT imaging.   Asymptomatic bacteriuria UA dirty but no symptoms-makes very little urine No longer on Cefepime-UTI ruled out.  Chronic HFrEF Volume removal with HD  CAD No anginal symptoms Blocker/statin Suspect not on ASA due to being on Eliquis.  PAF Telemetry monitoring Coreg/amiodarone Eliquis  ESRD on HD TTS Nephrology following  Hypokalemia Replete/recheck  Normocytic anemia Secondary to CKD Follow CBC and transfuse if significant drop  PAD-s/p right BKA  Depression/anxiety Relatively stable Effexor/Remeron/Klonopin  Hypothyroidism Synthroid TSH on 8/22 stable  Debility/deconditioning PT/OT eval  BMI: Estimated body mass index is 28.65 kg/m as calculated from the following:   Height as of this encounter: 5\' 5"  (1.651 m).   Weight as of this encounter: 78.1 kg.   Code status:   Code Status: Full Code   DVT Prophylaxis: SCDs Start: 02/05/23 1719 apixaban (ELIQUIS) tablet 5 mg    Family Communication: None at bedside   Disposition Plan: Status is: Inpatient Remains inpatient appropriate because:    Planned Discharge Destination:Home   Diet: Diet Order             Diet renal/carb modified with fluid restriction Diet-HS Snack? Nothing; Fluid restriction: 1200 mL Fluid; Room service appropriate? Yes; Fluid consistency:  Thin  Diet effective now                     Antimicrobial agents: Anti-infectives (From admission, onward)    Start     Dose/Rate Route Frequency Ordered Stop   02/06/23 1600  ceFEPIme (MAXIPIME) 1 g in sodium chloride 0.9 % 100 mL IVPB  Status:  Discontinued        1 g 200 mL/hr over 30 Minutes Intravenous Every 24 hours 02/05/23 2044 02/06/23 0935   02/05/23 2130  ceFEPIme (MAXIPIME) 1 g in sodium  chloride 0.9 % 100 mL IVPB  Status:  Discontinued        1 g 200 mL/hr over 30 Minutes Intravenous Every 24 hours 02/05/23 2041 02/05/23 2044   02/05/23 1630  ceFEPIme (MAXIPIME) 2 g in sodium chloride 0.9 % 100 mL IVPB        2 g 200 mL/hr over 30 Minutes Intravenous  Once 02/05/23 1625 02/05/23 1737        MEDICATIONS: Scheduled Meds:  amiodarone  200 mg Oral Daily   apixaban  5 mg Oral BID   atorvastatin  80 mg Oral Daily   calcitRIOL  0.75 mcg Oral Q T,Th,Sa-HD   carvedilol  6.25 mg Oral BID WC   Chlorhexidine Gluconate Cloth  6 each Topical Q0600   clonazePAM  0.5 mg Oral BID   ferric citrate  420 mg Oral TID WC   insulin aspart  0-9 Units Subcutaneous TID WC   insulin aspart  2 Units Subcutaneous TID WC   insulin glargine-yfgn  18 Units Subcutaneous Q24H   levothyroxine  75 mcg Oral Q0600   metoCLOPramide (REGLAN) injection  5 mg Intravenous TID AC   mirtazapine  7.5 mg Oral QHS   pantoprazole  40 mg Oral BID AC   potassium chloride  40 mEq Oral Once   venlafaxine XR  75 mg Oral Q breakfast   Continuous Infusions:  magnesium sulfate bolus IVPB     PRN Meds:.acetaminophen **OR** acetaminophen, dextrose, heparin, methocarbamol, midodrine, ondansetron **OR** ondansetron (ZOFRAN) IV, oxyCODONE   I have personally reviewed following labs and imaging studies  LABORATORY DATA: CBC: Recent Labs  Lab 02/01/23 0701 02/02/23 0516 02/05/23 1114 02/05/23 1351 02/06/23 0726 02/07/23 0810  WBC 7.5 5.8 6.9  --  6.6 5.8  NEUTROABS 4.8  --   --   --   --  4.2  HGB 11.4* 11.1* 11.7* 13.9 10.6* 10.8*  HCT 38.5 36.5 40.4 41.0 35.9* 35.0*  MCV 79.1* 77.5* 77.7*  --  76.9* 77.3*  PLT 270 207 237  --  215 221    Basic Metabolic Panel: Recent Labs  Lab 02/01/23 0701 02/02/23 0516 02/05/23 1114 02/05/23 1342 02/05/23 1351 02/05/23 1543 02/05/23 2151 02/06/23 0726 02/07/23 0417  NA 134* 132* 127*  --  127* 127* 128* 131* 132*  K 3.5 3.7 4.3  --  4.1 4.1 3.4* 3.6 3.1*   CL 97* 93* 89*  --   --  89* 92* 95* 95*  CO2 25 25 23   --   --  22 22 21* 27  GLUCOSE 160* 106* 688*  --   --  593* 228* 78 259*  BUN 28* 43* 48*  --   --  45* 49* 53* 20  CREATININE 3.71* 5.03* 4.56* 4.54*  --  4.34* 4.39* 4.62* 2.90*  CALCIUM 9.3 9.4 9.8  --   --  9.5 9.2 9.3 8.4*  MG 1.7 1.7  --   --   --   --   --  1.7 1.9  PHOS 2.5 2.5  --   --   --  3.3  --  3.7 2.4*    GFR: Estimated Creatinine Clearance: 17.8 mL/min (A) (by C-G formula based on SCr of 2.9 mg/dL (H)).  Liver Function Tests: Recent Labs  Lab 02/01/23 0701 02/05/23 1114 02/06/23 0726 02/07/23 0417  AST 18 21  --   --   ALT 14 21  --   --   ALKPHOS 148* 222*  --   --   BILITOT 0.3 0.7  --   --   PROT 6.3* 7.4  --   --   ALBUMIN 3.0* 3.5 3.0* 2.5*   Recent Labs  Lab 02/05/23 1114  LIPASE 23   No results for input(s): "AMMONIA" in the last 168 hours.  Coagulation Profile: No results for input(s): "INR", "PROTIME" in the last 168 hours.  Cardiac Enzymes: No results for input(s): "CKTOTAL", "CKMB", "CKMBINDEX", "TROPONINI" in the last 168 hours.  BNP (last 3 results) No results for input(s): "PROBNP" in the last 8760 hours.  Lipid Profile: No results for input(s): "CHOL", "HDL", "LDLCALC", "TRIG", "CHOLHDL", "LDLDIRECT" in the last 72 hours.  Thyroid Function Tests: No results for input(s): "TSH", "T4TOTAL", "FREET4", "T3FREE", "THYROIDAB" in the last 72 hours.  Anemia Panel: No results for input(s): "VITAMINB12", "FOLATE", "FERRITIN", "TIBC", "IRON", "RETICCTPCT" in the last 72 hours.  Urine analysis:    Component Value Date/Time   COLORURINE AMBER (A) 02/05/2023 1418   APPEARANCEUR TURBID (A) 02/05/2023 1418   LABSPEC 1.015 02/05/2023 1418   PHURINE 6.0 02/05/2023 1418   GLUCOSEU >=500 (A) 02/05/2023 1418   HGBUR MODERATE (A) 02/05/2023 1418   BILIRUBINUR NEGATIVE 02/05/2023 1418   BILIRUBINUR negative 06/05/2020 1334   KETONESUR 5 (A) 02/05/2023 1418   PROTEINUR 100 (A) 02/05/2023  1418   UROBILINOGEN 0.2 06/05/2020 1334   UROBILINOGEN 0.2 04/01/2014 1659   NITRITE NEGATIVE 02/05/2023 1418   LEUKOCYTESUR LARGE (A) 02/05/2023 1418    Sepsis Labs: Lactic Acid, Venous    Component Value Date/Time   LATICACIDVEN 0.9 11/20/2022 0116    MICROBIOLOGY: No results found for this or any previous visit (from the past 240 hour(s)).  RADIOLOGY STUDIES/RESULTS: No results found.   LOS: 2 days   Jeoffrey Massed, MD  [facility]    To contact the attending provider between 7A-7P or the covering provider during after hours 7P-7A, please log into the web site [email or web address] and access using universal [place] password for that web site. If you do not have the password, please call the hospital operator.  02/07/2023, 10:18 AM

## 2023-02-08 DIAGNOSIS — E1100 Type 2 diabetes mellitus with hyperosmolarity without nonketotic hyperglycemic-hyperosmolar coma (NKHHC): Secondary | ICD-10-CM | POA: Diagnosis not present

## 2023-02-08 DIAGNOSIS — N186 End stage renal disease: Secondary | ICD-10-CM | POA: Diagnosis not present

## 2023-02-08 DIAGNOSIS — I480 Paroxysmal atrial fibrillation: Secondary | ICD-10-CM | POA: Diagnosis not present

## 2023-02-08 DIAGNOSIS — I5020 Unspecified systolic (congestive) heart failure: Secondary | ICD-10-CM | POA: Diagnosis not present

## 2023-02-08 LAB — RENAL FUNCTION PANEL
Albumin: 2.5 g/dL — ABNORMAL LOW (ref 3.5–5.0)
Albumin: 3 g/dL — ABNORMAL LOW (ref 3.5–5.0)
Anion gap: 10 (ref 5–15)
Anion gap: 10 (ref 5–15)
BUN: 20 mg/dL (ref 8–23)
BUN: 16 mg/dL (ref 8–23)
CO2: 27 mmol/L (ref 22–32)
CO2: 27 mmol/L (ref 22–32)
Calcium: 8.4 mg/dL — ABNORMAL LOW (ref 8.9–10.3)
Calcium: 9.2 mg/dL (ref 8.9–10.3)
Chloride: 95 mmol/L — ABNORMAL LOW (ref 98–111)
Chloride: 96 mmol/L — ABNORMAL LOW (ref 98–111)
Creatinine, Ser: 2.9 mg/dL — ABNORMAL HIGH (ref 0.44–1.00)
Creatinine, Ser: 2.84 mg/dL — ABNORMAL HIGH (ref 0.44–1.00)
GFR, Estimated: 17 mL/min — ABNORMAL LOW (ref >60)
GFR, Estimated: 17 mL/min — ABNORMAL LOW (ref >60)
Glucose, Bld: 259 mg/dL — ABNORMAL HIGH (ref 70–99)
Glucose, Bld: 171 mg/dL — ABNORMAL HIGH (ref 70–99)
Phosphorus: 2.4 mg/dL — ABNORMAL LOW (ref 2.5–4.6)
Phosphorus: 4 mg/dL (ref 2.5–4.6)
Potassium: 3.1 mmol/L — ABNORMAL LOW (ref 3.5–5.1)
Potassium: 3.7 mmol/L (ref 3.5–5.1)
Sodium: 132 mmol/L — ABNORMAL LOW (ref 135–145)
Sodium: 133 mmol/L — ABNORMAL LOW (ref 135–145)

## 2023-02-08 LAB — MAGNESIUM: Magnesium: 2.5 mg/dL — ABNORMAL HIGH (ref 1.7–2.4)

## 2023-02-08 LAB — GLUCOSE, CAPILLARY
Glucose-Capillary: 123 mg/dL — ABNORMAL HIGH (ref 70–99)
Glucose-Capillary: 40 mg/dL — CL (ref 70–99)
Glucose-Capillary: 152 mg/dL — ABNORMAL HIGH (ref 70–99)
Glucose-Capillary: 226 mg/dL — ABNORMAL HIGH (ref 70–99)
Glucose-Capillary: 230 mg/dL — ABNORMAL HIGH (ref 70–99)
Glucose-Capillary: 143 mg/dL — ABNORMAL HIGH (ref 70–99)

## 2023-02-08 MED ORDER — PROSOURCE PLUS PO LIQD
30.0000 mL | Freq: Two times a day (BID) | ORAL | Status: DC
  Administered 2023-02-09 – 2023-02-17 (×5): 30 mL via ORAL
  Filled 2023-02-08 (×6): qty 30

## 2023-02-08 MED ORDER — INSULIN ASPART 100 UNIT/ML IJ SOLN
0.0000 [IU] | Freq: Three times a day (TID) | INTRAMUSCULAR | Status: DC
  Administered 2023-02-08 (×2): 2 [IU] via SUBCUTANEOUS
  Administered 2023-02-09 (×2): 1 [IU] via SUBCUTANEOUS
  Administered 2023-02-10: 4 [IU] via SUBCUTANEOUS
  Administered 2023-02-10 – 2023-02-11 (×2): 1 [IU] via SUBCUTANEOUS
  Administered 2023-02-13 (×2): 5 [IU] via SUBCUTANEOUS
  Administered 2023-02-13: 4 [IU] via SUBCUTANEOUS
  Administered 2023-02-14: 1 [IU] via SUBCUTANEOUS
  Administered 2023-02-14: 15 [IU] via SUBCUTANEOUS
  Administered 2023-02-15 (×2): 2 [IU] via SUBCUTANEOUS
  Administered 2023-02-16: 5 [IU] via SUBCUTANEOUS
  Administered 2023-02-16: 1 [IU] via SUBCUTANEOUS

## 2023-02-08 MED ORDER — INSULIN GLARGINE-YFGN 100 UNIT/ML ~~LOC~~ SOLN: 15.0000 [IU] | SUBCUTANEOUS | Status: DC

## 2023-02-08 MED ORDER — FERRIC CITRATE 1 GM 210 MG(FE) PO TABS
210.0000 mg | ORAL_TABLET | Freq: Three times a day (TID) | ORAL | Status: DC
  Administered 2023-02-08 – 2023-02-17 (×20): 210 mg via ORAL
  Filled 2023-02-08 (×20): qty 1

## 2023-02-08 MED ORDER — INSULIN GLARGINE-YFGN 100 UNIT/ML ~~LOC~~ SOLN
12.0000 [IU] | SUBCUTANEOUS | Status: DC
  Administered 2023-02-08: 12 [IU] via SUBCUTANEOUS
  Filled 2023-02-08: qty 0.12

## 2023-02-08 NOTE — Progress Notes (Signed)
PROGRESS NOTE        PATIENT DETAILS Name: Susan [name] Age: 72 y.o. Sex: female Date of Birth: 18-Aug-1950 Admit Date: 02/05/2023 Admitting Physician Alan Mulder, MD [id number], Priscille Heidelberg, MD  Brief Summary: Patient is a 72 y.o.  female with history of ESRD on HD TTS, DM-2, PAF, CAD, HFrEF, HTN, PAD s/p right BKA-presented with nausea/vomiting in the setting of hyperosmolar hyperglycemic state.  Significant events: 9/12>> admit to [facility]  Significant studies: 9/14>> CT abdomen: No SBO  Significant microbiology data: None  Procedures: None  Consults: Nephrology  Subjective: No vomiting since yesterday afternoon-was n.p.o.  Denies any abdominal pain.  Objective: Vitals: Blood pressure (!) 125/43, pulse 68, temperature 98.3 F (36.8 C), temperature source Axillary, resp. rate 16, height 5\' 5"  (1.651 m), weight 76.8 kg, SpO2 98%.   Exam: Gen Exam:Alert awake-not in any distress HEENT:atraumatic, normocephalic Chest: B/L clear to auscultation anteriorly CVS:S1S2 regular Abdomen:soft non tender, non distended Extremities:no edema Neurology: Non focal Skin: no rash  Pertinent Labs/Radiology:    Latest Ref Rng & Units 02/07/2023    8:10 AM 02/06/2023    7:26 AM 02/05/2023    1:51 PM  CBC  WBC 4.0 - 10.5 K/uL 5.8  6.6    Hemoglobin 12.0 - 15.0 g/dL 86.5  78.4  69.6   Hematocrit 36.0 - 46.0 % 35.0  35.9  41.0   Platelets 150 - 400 K/uL 221  215      Lab Results  Component Value Date   NA 132 (L) 02/07/2023   K 3.1 (L) 02/07/2023   CL 95 (L) 02/07/2023   CO2 27 02/07/2023      Assessment/Plan: Hyperosmolar hyperglycemic state Required insulin infusion-has been transitioned to SQ insulin over night  DM-2 with uncontrolled hyperglycemia (A1c 7.9 on 4/3) Hypoglycemic event earlier this morning-likely due to n.p.o. status  Semglee reduced to 12 units  Follow and adjust. Allow some amount of permissive hyperglycemia.  Recent Labs     02/08/23 0357 02/08/23 0429 02/08/23 0735  GLUCAP 40* 123* 152*     Nausea/vomiting likely due to gastroparesis flare Kept n.p.o. on 9/14-CT abdomen without SBO Given scheduled Reglan x 3 doses with clinical improvement Advance to clear liquids Encouraged small portion meals If no improvement-will require erythromycin infusion. Follow for now.  Asymptomatic bacteriuria UA dirty but no symptoms-makes very little urine No longer on Cefepime-UTI ruled out.  Chronic HFrEF Volume removal with HD  CAD No anginal symptoms Blocker/statin Suspect not on ASA due to being on Eliquis.  PAF Telemetry monitoring Coreg/amiodarone Eliquis  ESRD on HD TTS Nephrology following  Hypokalemia Replete/recheck  Normocytic anemia Secondary to CKD Follow CBC and transfuse if significant drop  PAD-s/p right BKA  Depression/anxiety Relatively stable Effexor/Remeron/Klonopin  Hypothyroidism Synthroid TSH on 8/22 stable  Debility/deconditioning PT/OT eval  BMI: Estimated body mass index is 28.18 kg/m as calculated from the following:   Height as of this encounter: 5\' 5"  (1.651 m).   Weight as of this encounter: 76.8 kg.   Code status:   Code Status: Full Code   DVT Prophylaxis: SCDs Start: 02/05/23 1719 apixaban (ELIQUIS) tablet 5 mg    Family Communication: None at bedside   Disposition Plan: Status is: Inpatient Remains inpatient appropriate because:    Planned Discharge Destination:Home   Diet: Diet Order  Diet clear liquid Fluid consistency: Thin  Diet effective now                     Antimicrobial agents: Anti-infectives (From admission, onward)    Start     Dose/Rate Route Frequency Ordered Stop   02/06/23 1600  ceFEPIme (MAXIPIME) 1 g in sodium chloride 0.9 % 100 mL IVPB  Status:  Discontinued        1 g 200 mL/hr over 30 Minutes Intravenous Every 24 hours 02/05/23 2044 02/06/23 0935   02/05/23 2130  ceFEPIme (MAXIPIME) 1 g  in sodium chloride 0.9 % 100 mL IVPB  Status:  Discontinued        1 g 200 mL/hr over 30 Minutes Intravenous Every 24 hours 02/05/23 2041 02/05/23 2044   02/05/23 1630  ceFEPIme (MAXIPIME) 2 g in sodium chloride 0.9 % 100 mL IVPB        2 g 200 mL/hr over 30 Minutes Intravenous  Once 02/05/23 1625 02/05/23 1737        MEDICATIONS: Scheduled Meds:  (feeding supplement) PROSource Plus  30 mL Oral BID BM   amiodarone  200 mg Oral Daily   apixaban  5 mg Oral BID   atorvastatin  80 mg Oral Daily   calcitRIOL  0.75 mcg Oral Q T,Th,Sa-HD   carvedilol  6.25 mg Oral BID WC   Chlorhexidine Gluconate Cloth  6 each Topical Q0600   clonazePAM  0.5 mg Oral BID   ferric citrate  210 mg Oral TID WC   insulin aspart  0-6 Units Subcutaneous TID WC   insulin aspart  2 Units Subcutaneous TID WC   insulin glargine-yfgn  12 Units Subcutaneous Q24H   levothyroxine  75 mcg Oral Q0600   mirtazapine  7.5 mg Oral QHS   pantoprazole (PROTONIX) IV  40 mg Intravenous Q12H   polyethylene glycol  17 g Oral Daily   potassium chloride  40 mEq Oral Once   venlafaxine XR  75 mg Oral Q breakfast   Continuous Infusions:   PRN Meds:.acetaminophen **OR** acetaminophen, bisacodyl, dextrose, methocarbamol, midodrine, ondansetron **OR** ondansetron (ZOFRAN) IV, trimethobenzamide   I have personally reviewed following labs and imaging studies  LABORATORY DATA: CBC: Recent Labs  Lab 02/02/23 0516 02/05/23 1114 02/05/23 1351 02/06/23 0726 02/07/23 0810  WBC 5.8 6.9  --  6.6 5.8  NEUTROABS  --   --   --   --  4.2  HGB 11.1* 11.7* 13.9 10.6* 10.8*  HCT 36.5 40.4 41.0 35.9* 35.0*  MCV 77.5* 77.7*  --  76.9* 77.3*  PLT 207 237  --  215 221    Basic Metabolic Panel: Recent Labs  Lab 02/02/23 0516 02/05/23 1114 02/05/23 1342 02/05/23 1351 02/05/23 1543 02/05/23 2151 02/06/23 0726 02/07/23 0417  NA 132* 127*  --  127* 127* 128* 131* 132*  K 3.7 4.3  --  4.1 4.1 3.4* 3.6 3.1*  CL 93* 89*  --   --   89* 92* 95* 95*  CO2 25 23  --   --  22 22 21* 27  GLUCOSE 106* 688*  --   --  593* 228* 78 259*  BUN 43* 48*  --   --  45* 49* 53* 20  CREATININE 5.03* 4.56* 4.54*  --  4.34* 4.39* 4.62* 2.90*  CALCIUM 9.4 9.8  --   --  9.5 9.2 9.3 8.4*  MG 1.7  --   --   --   --   --  1.7 1.9  PHOS 2.5  --   --   --  3.3  --  3.7 2.4*    GFR: Estimated Creatinine Clearance: 17.7 mL/min (A) (by C-G formula based on SCr of 2.9 mg/dL (H)).  Liver Function Tests: Recent Labs  Lab 02/05/23 1114 02/06/23 0726 02/07/23 0417  AST 21  --   --   ALT 21  --   --   ALKPHOS 222*  --   --   BILITOT 0.7  --   --   PROT 7.4  --   --   ALBUMIN 3.5 3.0* 2.5*   Recent Labs  Lab 02/05/23 1114  LIPASE 23   No results for input(s): "AMMONIA" in the last 168 hours.  Coagulation Profile: No results for input(s): "INR", "PROTIME" in the last 168 hours.  Cardiac Enzymes: No results for input(s): "CKTOTAL", "CKMB", "CKMBINDEX", "TROPONINI" in the last 168 hours.  BNP (last 3 results) No results for input(s): "PROBNP" in the last 8760 hours.  Lipid Profile: No results for input(s): "CHOL", "HDL", "LDLCALC", "TRIG", "CHOLHDL", "LDLDIRECT" in the last 72 hours.  Thyroid Function Tests: No results for input(s): "TSH", "T4TOTAL", "FREET4", "T3FREE", "THYROIDAB" in the last 72 hours.  Anemia Panel: No results for input(s): "VITAMINB12", "FOLATE", "FERRITIN", "TIBC", "IRON", "RETICCTPCT" in the last 72 hours.  Urine analysis:    Component Value Date/Time   COLORURINE AMBER (A) 02/05/2023 1418   APPEARANCEUR TURBID (A) 02/05/2023 1418   LABSPEC 1.015 02/05/2023 1418   PHURINE 6.0 02/05/2023 1418   GLUCOSEU >=500 (A) 02/05/2023 1418   HGBUR MODERATE (A) 02/05/2023 1418   BILIRUBINUR NEGATIVE 02/05/2023 1418   BILIRUBINUR negative 06/05/2020 1334   KETONESUR 5 (A) 02/05/2023 1418   PROTEINUR 100 (A) 02/05/2023 1418   UROBILINOGEN 0.2 06/05/2020 1334   UROBILINOGEN 0.2 04/01/2014 1659   NITRITE NEGATIVE  02/05/2023 1418   LEUKOCYTESUR LARGE (A) 02/05/2023 1418    Sepsis Labs: Lactic Acid, Venous    Component Value Date/Time   LATICACIDVEN 0.9 11/20/2022 0116    MICROBIOLOGY: No results found for this or any previous visit (from the past 240 hour(s)).  RADIOLOGY STUDIES/RESULTS: CT ABDOMEN PELVIS W CONTRAST  Result Date: 02/07/2023 CLINICAL DATA:  Persistent vomiting, evaluate for small bowel obstruction EXAM: CT ABDOMEN AND PELVIS WITH CONTRAST TECHNIQUE: Multidetector CT imaging of the abdomen and pelvis was performed using the standard protocol following bolus administration of intravenous contrast. RADIATION DOSE REDUCTION: This exam was performed according to the departmental dose-optimization program which includes automated exposure control, adjustment of the mA and/or kV according to patient size and/or use of iterative reconstruction technique. CONTRAST:  75mL OMNIPAQUE IOHEXOL 350 MG/ML SOLN COMPARISON:  01/20/2023 FINDINGS: Lower chest: Small bilateral pleural effusions associated bilateral lower lobe atelectasis. Hepatobiliary: Calcified hepatic granulomata. Liver is otherwise within normal limits. Layering small gallstones (series 3/image 28), without associated inflammatory changes. No intrahepatic or extrahepatic ductal dilatation. Pancreas: Within normal limits Spleen: Sequela of prior splenic infarcts. Adrenals/Urinary Tract: Adrenal glands are within normal limits. 3.1 cm right lower pole renal sinus cyst, measuring simple fluid density, benign (Bosniak I). No follow-up is recommended. Left kidney is within normal limits.  No hydronephrosis. Bladder is within normal limits. Stomach/Bowel: Stomach is underdistended but grossly unremarkable. No evidence of bowel obstruction. Normal appendix (series 3/image 58). Mild sigmoid diverticulosis, without evidence of diverticulitis. Vascular/Lymphatic: No evidence of abdominal aortic aneurysm. Atherosclerotic calcifications of the abdominal  aorta and branch vessels, although vessels remain patent. No suspicious abdominopelvic lymphadenopathy. Reproductive: Status  post hysterectomy. No adnexal masses. Other: No abdominopelvic ascites. Musculoskeletal: Degenerative changes of the visualized thoracolumbar spine. IMPRESSION: No evidence of bowel obstruction. Cholelithiasis, without associated inflammatory changes. Small bilateral pleural effusions associated bilateral lower lobe atelectasis. Electronically Signed   By: Charline Bills M.D.   On: 02/07/2023 18:25     LOS: 3 days   Jeoffrey Massed, MD  [facility]    To contact the attending provider between 7A-7P or the covering provider during after hours 7P-7A, please log into the web site [email or web address] and access using universal [place] password for that web site. If you do not have the password, please call the hospital operator.  02/08/2023, 10:19 AM

## 2023-02-08 NOTE — Plan of Care (Signed)
  Problem: Activity: Goal: Ability to return to baseline activity level will improve Outcome: Progressing   Problem: Respiratory: Goal: Will regain and/or maintain adequate ventilation Outcome: Progressing   Problem: Education: Goal: Knowledge of General Education information will improve Description: Including pain rating scale, medication(s)/side effects and non-pharmacologic comfort measures Outcome: Progressing   Problem: Clinical Measurements: Goal: Ability to maintain clinical measurements within normal limits will improve Outcome: Progressing Goal: Will remain free from infection Outcome: Progressing   Problem: Safety: Goal: Ability to remain free from injury will improve Outcome: Progressing   Problem: Skin Integrity: Goal: Risk for impaired skin integrity will decrease Outcome: Progressing

## 2023-02-08 NOTE — Progress Notes (Signed)
Hypoglycemic Event  CBG: 40mg /dl @ 9562  Treatment: Z30 50 mL (25 gm)  Symptoms: Sweaty  Follow-up CBG: Time:0430 CBG Result:123  Possible Reasons for Event: Other: NPO  Comments/MD notified: MD Ovidio Kin

## 2023-02-08 NOTE — Plan of Care (Signed)
Problem: Education: Goal: Knowledge of the prescribed therapeutic regimen will improve Outcome: Progressing Goal: Ability to verbalize activity precautions or restrictions will improve Outcome: Progressing Goal: Understanding of discharge needs will improve Outcome: Progressing   Problem: Activity: Goal: Ability to perform//tolerate increased activity and mobilize with assistive devices will improve Outcome: Progressing   Problem: Clinical Measurements: Goal: Postoperative complications will be avoided or minimized Outcome: Progressing   Problem: Self-Care: Goal: Ability to meet self-care needs will improve Outcome: Progressing   Problem: Self-Concept: Goal: Ability to maintain and perform role responsibilities to the fullest extent possible will improve Outcome: Progressing   Problem: Pain Management: Goal: Pain level will decrease with appropriate interventions Outcome: Progressing   Problem: Skin Integrity: Goal: Demonstration of wound healing without infection will improve Outcome: Progressing   Problem: Education: Goal: Understanding of CV disease, CV risk reduction, and recovery process will improve Outcome: Progressing Goal: Individualized Educational Video(s) Outcome: Progressing   Problem: Activity: Goal: Ability to return to baseline activity level will improve Outcome: Progressing   Problem: Cardiovascular: Goal: Ability to achieve and maintain adequate cardiovascular perfusion will improve Outcome: Progressing Goal: Vascular access site(s) Level 0-1 will be maintained Outcome: Progressing   Problem: Health Behavior/Discharge Planning: Goal: Ability to safely manage health-related needs after discharge will improve Outcome: Progressing   Problem: Education: Goal: Ability to describe self-care measures that may prevent or decrease complications (Diabetes Survival Skills Education) will improve Outcome: Progressing Goal: Individualized Educational  Video(s) Outcome: Progressing   Problem: Cardiac: Goal: Ability to maintain an adequate cardiac output will improve Outcome: Progressing   Problem: Health Behavior/Discharge Planning: Goal: Ability to identify and utilize available resources and services will improve Outcome: Progressing Goal: Ability to manage health-related needs will improve Outcome: Progressing   Problem: Fluid Volume: Goal: Ability to achieve a balanced intake and output will improve Outcome: Progressing   Problem: Metabolic: Goal: Ability to maintain appropriate glucose levels will improve Outcome: Progressing   Problem: Nutritional: Goal: Maintenance of adequate nutrition will improve Outcome: Progressing Goal: Maintenance of adequate weight for body size and type will improve Outcome: Progressing   Problem: Respiratory: Goal: Will regain and/or maintain adequate ventilation Outcome: Progressing   Problem: Urinary Elimination: Goal: Ability to achieve and maintain adequate renal perfusion and functioning will improve Outcome: Progressing   Problem: Education: Goal: Knowledge of General Education information will improve Description: Including pain rating scale, medication(s)/side effects and non-pharmacologic comfort measures Outcome: Progressing   Problem: Health Behavior/Discharge Planning: Goal: Ability to manage health-related needs will improve Outcome: Progressing   Problem: Clinical Measurements: Goal: Ability to maintain clinical measurements within normal limits will improve Outcome: Progressing Goal: Will remain free from infection Outcome: Progressing Goal: Diagnostic test results will improve Outcome: Progressing Goal: Respiratory complications will improve Outcome: Progressing Goal: Cardiovascular complication will be avoided Outcome: Progressing   Problem: Activity: Goal: Risk for activity intolerance will decrease Outcome: Progressing   Problem: Nutrition: Goal:  Adequate nutrition will be maintained Outcome: Progressing   Problem: Coping: Goal: Level of anxiety will decrease Outcome: Progressing   Problem: Elimination: Goal: Will not experience complications related to bowel motility Outcome: Progressing Goal: Will not experience complications related to urinary retention Outcome: Progressing   Problem: Pain Managment: Goal: General experience of comfort will improve Outcome: Progressing   Problem: Safety: Goal: Ability to remain free from injury will improve Outcome: Progressing   Problem: Skin Integrity: Goal: Risk for impaired skin integrity will decrease Outcome: Progressing   Problem: Education: Goal: Ability to describe self-care measures  that may prevent or decrease complications (Diabetes Survival Skills Education) will improve Outcome: Progressing Goal: Individualized Educational Video(s) Outcome: Progressing   Problem: Coping: Goal: Ability to adjust to condition or change in health will improve Outcome: Progressing

## 2023-02-08 NOTE — Progress Notes (Signed)
[facility] Progress Note   Subjective:  Seen in room - no overnight issues reported. Dialyzed yesterday - 1.5L off.  Objective Vitals:   02/07/23 2009 02/08/23 0002 02/08/23 0520 02/08/23 0801  BP: 123/67 (!) 119/46 95/75 (!) 125/43  Pulse: 78 67 62 68  Resp: 12 (!) 21 16 16   Temp: 98 F (36.7 C) 98.3 F (36.8 C) 97.8 F (36.6 C) 98.3 F (36.8 C)  TempSrc: Oral Oral Axillary Axillary  SpO2: 98% 99% 100% 98%  Weight:      Height:       Physical Exam General: Chronically ill appearing woman, NAD. Nasal O2 in place. Heart: RRR; no murmur Lungs: CTA anteriorly Abdomen: soft, non-tender Extremities: R BKA, 1+ LLE edema Dialysis Access: [facility]  Additional Objective Labs: Basic Metabolic Panel: Recent Labs  Lab 02/05/23 1543 02/05/23 2151 02/06/23 0726 02/07/23 0417  NA 127* 128* 131* 132*  K 4.1 3.4* 3.6 3.1*  CL 89* 92* 95* 95*  CO2 22 22 21* 27  GLUCOSE 593* 228* 78 259*  BUN 45* 49* 53* 20  CREATININE 4.34* 4.39* 4.62* 2.90*  CALCIUM 9.5 9.2 9.3 8.4*  PHOS 3.3  --  3.7 2.4*   Liver Function Tests: Recent Labs  Lab 02/05/23 1114 02/06/23 0726 02/07/23 0417  AST 21  --   --   ALT 21  --   --   ALKPHOS 222*  --   --   BILITOT 0.7  --   --   PROT 7.4  --   --   ALBUMIN 3.5 3.0* 2.5*   Recent Labs  Lab 02/05/23 1114  LIPASE 23   CBC: Recent Labs  Lab 02/02/23 0516 02/05/23 1114 02/05/23 1351 02/06/23 0726 02/07/23 0810  WBC 5.8 6.9  --  6.6 5.8  NEUTROABS  --   --   --   --  4.2  HGB 11.1* 11.7* 13.9 10.6* 10.8*  HCT 36.5 40.4 41.0 35.9* 35.0*  MCV 77.5* 77.7*  --  76.9* 77.3*  PLT 207 237  --  215 221   Studies/Results: CT ABDOMEN PELVIS W CONTRAST  Result Date: 02/07/2023 CLINICAL DATA:  Persistent vomiting, evaluate for small bowel obstruction EXAM: CT ABDOMEN AND PELVIS WITH CONTRAST TECHNIQUE: Multidetector CT imaging of the abdomen and pelvis was performed using the standard protocol following bolus administration of  intravenous contrast. RADIATION DOSE REDUCTION: This exam was performed according to the departmental dose-optimization program which includes automated exposure control, adjustment of the mA and/or kV according to patient size and/or use of iterative reconstruction technique. CONTRAST:  75mL OMNIPAQUE IOHEXOL 350 MG/ML SOLN COMPARISON:  01/20/2023 FINDINGS: Lower chest: Small bilateral pleural effusions associated bilateral lower lobe atelectasis. Hepatobiliary: Calcified hepatic granulomata. Liver is otherwise within normal limits. Layering small gallstones (series 3/image 28), without associated inflammatory changes. No intrahepatic or extrahepatic ductal dilatation. Pancreas: Within normal limits Spleen: Sequela of prior splenic infarcts. Adrenals/Urinary Tract: Adrenal glands are within normal limits. 3.1 cm right lower pole renal sinus cyst, measuring simple fluid density, benign (Bosniak I). No follow-up is recommended. Left kidney is within normal limits.  No hydronephrosis. Bladder is within normal limits. Stomach/Bowel: Stomach is underdistended but grossly unremarkable. No evidence of bowel obstruction. Normal appendix (series 3/image 58). Mild sigmoid diverticulosis, without evidence of diverticulitis. Vascular/Lymphatic: No evidence of abdominal aortic aneurysm. Atherosclerotic calcifications of the abdominal aorta and branch vessels, although vessels remain patent. No suspicious abdominopelvic lymphadenopathy. Reproductive: Status post hysterectomy. No adnexal masses. Other: No abdominopelvic ascites. Musculoskeletal:  Degenerative changes of the visualized thoracolumbar spine. IMPRESSION: No evidence of bowel obstruction. Cholelithiasis, without associated inflammatory changes. Small bilateral pleural effusions associated bilateral lower lobe atelectasis. Electronically Signed   By: Charline Bills M.D.   On: 02/07/2023 18:25    Medications:   amiodarone  200 mg Oral Daily   apixaban  5 mg Oral  BID   atorvastatin  80 mg Oral Daily   calcitRIOL  0.75 mcg Oral Q T,Th,Sa-HD   carvedilol  6.25 mg Oral BID WC   Chlorhexidine Gluconate Cloth  6 each Topical Q0600   clonazePAM  0.5 mg Oral BID   ferric citrate  420 mg Oral TID WC   insulin aspart  0-6 Units Subcutaneous TID WC   insulin aspart  2 Units Subcutaneous TID WC   insulin glargine-yfgn  12 Units Subcutaneous Q24H   levothyroxine  75 mcg Oral Q0600   mirtazapine  7.5 mg Oral QHS   pantoprazole (PROTONIX) IV  40 mg Intravenous Q12H   polyethylene glycol  17 g Oral Daily   potassium chloride  40 mEq Oral Once   venlafaxine XR  75 mg Oral Q breakfast    Dialysis Orders: TTS [facility] 4hr, 400/800, EDW 79kg, 2K/2Ca bath, TDC, Heparin 4000 + 2000 midrun - Aranesp given 9/3 during recent admit - Calcitriol 0.34mcg PO q HD - Binder recently changed from renvela to [place] Just discharged 02/02/2023   Assessment/Plan: 1. Intractable nausea/vomiting: Just discharged with same, readmitted 9/12. ?Felt due to colitis and/or gastroparesis, uncontrolled BS on admit - improved. Got 3 doses of Reglan yesterday - seems possibly better today - just waking up so hard to tell. 2. ESRD: Usual TTS schedule - back on usual schedule now - next HD 9/17. 3. HTN/volume: BP variable, does have edema on exam as well as hypoNa. UF as tolerated. 4. Anemia: Hgb 10.8 - follow without ESA for now. 5. Secondary hyperparathyroidism: CorrCa ok, Phos low. Reducing Auryxia to 1/meals. 6. Nutrition: Alb low, adding supps. 7. T2DM: BS > 600 on admit - better with insulin. 8. CAD 9. pAF: On Eliquis    Lily Peer 02/08/2023, 9:31 AM  [employer]

## 2023-02-09 DIAGNOSIS — N186 End stage renal disease: Secondary | ICD-10-CM | POA: Diagnosis not present

## 2023-02-09 DIAGNOSIS — E1100 Type 2 diabetes mellitus with hyperosmolarity without nonketotic hyperglycemic-hyperosmolar coma (NKHHC): Secondary | ICD-10-CM | POA: Diagnosis not present

## 2023-02-09 DIAGNOSIS — I480 Paroxysmal atrial fibrillation: Secondary | ICD-10-CM | POA: Diagnosis not present

## 2023-02-09 DIAGNOSIS — I5020 Unspecified systolic (congestive) heart failure: Secondary | ICD-10-CM | POA: Diagnosis not present

## 2023-02-09 LAB — GLUCOSE, CAPILLARY
Glucose-Capillary: 58 mg/dL — ABNORMAL LOW (ref 70–99)
Glucose-Capillary: 72 mg/dL (ref 70–99)
Glucose-Capillary: 182 mg/dL — ABNORMAL HIGH (ref 70–99)
Glucose-Capillary: 186 mg/dL — ABNORMAL HIGH (ref 70–99)
Glucose-Capillary: 236 mg/dL — ABNORMAL HIGH (ref 70–99)
Glucose-Capillary: 304 mg/dL — ABNORMAL HIGH (ref 70–99)

## 2023-02-09 MED ORDER — INSULIN GLARGINE-YFGN 100 UNIT/ML ~~LOC~~ SOLN
8.0000 [IU] | SUBCUTANEOUS | Status: DC
  Administered 2023-02-09 – 2023-02-12 (×4): 8 [IU] via SUBCUTANEOUS
  Filled 2023-02-09 (×5): qty 0.08

## 2023-02-09 MED ORDER — PANTOPRAZOLE SODIUM 40 MG PO TBEC
40.0000 mg | DELAYED_RELEASE_TABLET | Freq: Two times a day (BID) | ORAL | Status: DC
  Administered 2023-02-10 – 2023-02-17 (×12): 40 mg via ORAL
  Filled 2023-02-09 (×13): qty 1

## 2023-02-09 MED ORDER — METOCLOPRAMIDE HCL 5 MG/ML IJ SOLN
10.0000 mg | Freq: Three times a day (TID) | INTRAMUSCULAR | Status: DC
  Administered 2023-02-09 – 2023-02-10 (×2): 10 mg via INTRAVENOUS
  Filled 2023-02-09 (×2): qty 2

## 2023-02-09 NOTE — Progress Notes (Signed)
Physical Therapy Treatment Patient Details Name: Susan [name] MRN: [id number] DOB: March 29, 1951 Today's Date: 02/09/2023   History of Present Illness 72 y.o. female presents to [facility] hospital on 01/20/2023 with nausea and vomiting. Pt was recently admitted 8/22-8/24 for similar symptoms, found to have proctocolitis as well as early DKA. PMH includes OA, CAD, chronic diastolic HF, depression, ESRD, HTN, HLD, CVA, DMII, R BKA.    PT Comments  Progressing steadily.  Emphasis on  strengthening and range for both amputee and L LE's, transition to EOB, sit to stand and transfer via squat pivot in lieu of no sliding board.     If plan is discharge home, recommend the following: A lot of help with walking and/or transfers;Assistance with cooking/housework;Assist for transportation;Help with stairs or ramp for entrance   Can travel by private vehicle        Equipment Recommendations  None recommended by PT    Recommendations for Other Services       Precautions / Restrictions Precautions Precautions: Fall Precaution Comments: R BKA, LLE wound Restrictions Weight Bearing Restrictions: No RLE Weight Bearing: Weight bearing as tolerated     Mobility  Bed Mobility Overal bed mobility: Needs Assistance     Sidelying to sit: Contact guard assist, Used rails, HOB elevated       General bed mobility comments: no truncal assist, but cues to come up via R UE, pt using rail to assist.  pt scooting to EOB effortfully  with UE assist    Transfers Overall transfer level: Needs assistance Equipment used: None Transfers: Sit to/from Stand, Bed to chair/wheelchair/BSC Sit to Stand: Mod assist, +2 physical assistance (R LE support for w/bearing)     Squat pivot transfers: Mod assist, +2 physical assistance (R LE support for w/bearing assist)     General transfer comment: no sliding board available, assist with squat pivot.    Ambulation/Gait                   Stairs              Wheelchair Mobility     Tilt Bed    Modified Rankin (Stroke Patients Only)       Balance Overall balance assessment: Needs assistance Sitting-balance support: No upper extremity supported, Feet supported, Bilateral upper extremity supported Sitting balance-Leahy Scale: Good       Standing balance-Leahy Scale: Poor                              Cognition Arousal: Alert Behavior During Therapy: WFL for tasks assessed/performed Overall Cognitive Status: Within Functional Limits for tasks assessed                                          Exercises Amputee Exercises Quad Sets: AROM, Right, 10 reps Hip Extension: AROM, Right, 10 reps Hip Flexion/Marching: AROM, Right, 10 reps Knee Flexion: AROM, Right, 10 reps Knee Extension: AROM, Right, 10 reps    General Comments        Pertinent Vitals/Pain Pain Assessment Pain Assessment: Faces Faces Pain Scale: Hurts a little bit Pain Location: general joint stiffness with movement Pain Intervention(s): Monitored during session    Home Living  Prior Function            PT Goals (current goals can now be found in the care plan section) Acute Rehab PT Goals Patient Stated Goal: Return home with HHPT PT Goal Formulation: With patient Time For Goal Achievement: 02/20/23 Potential to Achieve Goals: Good Progress towards PT goals: Progressing toward goals    Frequency    Min 1X/week      PT Plan      Co-evaluation              AM-PAC PT "6 Clicks" Mobility   Outcome Measure  Help needed turning from your back to your side while in a flat bed without using bedrails?: A Little Help needed moving from lying on your back to sitting on the side of a flat bed without using bedrails?: A Little Help needed moving to and from a bed to a chair (including a wheelchair)?: Total Help needed standing up from a chair using your arms (e.g., wheelchair or  bedside chair)?: Total Help needed to walk in hospital room?: Total Help needed climbing 3-5 steps with a railing? : Total 6 Click Score: 10    End of Session Equipment Utilized During Treatment: Gait belt Activity Tolerance: Patient tolerated treatment well Patient left: in chair;with call bell/phone within reach;with chair alarm set Nurse Communication: Mobility status PT Visit Diagnosis: Other abnormalities of gait and mobility (R26.89);Muscle weakness (generalized) (M62.81)     Time: 1610-9604 PT Time Calculation (min) (ACUTE ONLY): 34 min  Charges:    $Therapeutic Exercise: 8-22 mins $Therapeutic Activity: 8-22 mins PT General Charges $$ ACUTE PT VISIT: 1 Visit                     02/09/2023  Jacinto Halim., PT Acute Rehabilitation Services [phone]  (office)   Susan [name] 02/09/2023, 5:48 PM

## 2023-02-09 NOTE — Discharge Instructions (Addendum)
Information on my medicine - ELIQUIS (apixaban)  This medication education was reviewed with me or my healthcare representative as part of my discharge preparation.    Why was Eliquis prescribed for you? Eliquis was prescribed for you to reduce the risk of a blood clot forming that can cause a stroke if you have a medical condition called atrial fibrillation (a type of irregular heartbeat).  What do You need to know about Eliquis ? Take your Eliquis TWICE DAILY - one tablet in the morning and one tablet in the evening with or without food. If you have difficulty swallowing the tablet whole please discuss with your pharmacist how to take the medication safely.  Take Eliquis exactly as prescribed by your doctor and DO NOT stop taking Eliquis without talking to the doctor who prescribed the medication.  Stopping may increase your risk of developing a stroke.  Refill your prescription before you run out.  After discharge, you should have regular check-up appointments with your healthcare provider that is prescribing your Eliquis.  In the future your dose may need to be changed if your kidney function or weight changes by a significant amount or as you get older.  What do you do if you miss a dose? If you miss a dose, take it as soon as you remember on the same day and resume taking twice daily.  Do not take more than one dose of ELIQUIS at the same time to make up a missed dose.  Important Safety Information A possible side effect of Eliquis is bleeding. You should call your healthcare provider right away if you experience any of the following: Bleeding from an injury or your nose that does not stop. Unusual colored urine (red or dark brown) or unusual colored stools (red or black). Unusual bruising for unknown reasons. A serious fall or if you hit your head (even if there is no bleeding).  Some medicines may interact with Eliquis and might increase your risk of bleeding or clotting  while on Eliquis. To help avoid this, consult your healthcare provider or pharmacist prior to using any new prescription or non-prescription medications, including herbals, vitamins, non-steroidal anti-inflammatory drugs (NSAIDs) and supplements.  This website has more information on Eliquis (apixaban): http://www.eliquis.com/eliquis/home     Follow with Primary MD Donita Brooks, MD in 7 days   Get CBC, CMP, 2 view Chest X ray -  checked next visit with your primary MD   Activity: As tolerated with Full fall precautions use walker/cane & assistance as needed  Disposition Home    Diet: Renal-low carbohydrate diet, 1.2 L fluid restriction per day.  Check CBGs q. [facility] S.  Special Instructions: If you have smoked or chewed Tobacco  in the last 2 yrs please stop smoking, stop any regular Alcohol  and or any Recreational drug use.  On your next visit with your primary care physician please Get Medicines reviewed and adjusted.  Please request your Prim.MD to go over all Hospital Tests and Procedure/Radiological results at the follow up, please get all Hospital records sent to your Prim MD by signing hospital release before you go home.  If you experience worsening of your admission symptoms, develop shortness of breath, life threatening emergency, suicidal or homicidal thoughts you must seek medical attention immediately by calling 911 or calling your MD immediately  if symptoms less severe.  You Must read complete instructions/literature along with all the possible adverse reactions/side effects for all the Medicines you take and that have  been prescribed to you. Take any new Medicines after you have completely understood and accpet all the possible adverse reactions/side effects.   Do not drive when taking Pain medications.  Do not take more than prescribed Pain, Sleep and Anxiety Medications

## 2023-02-09 NOTE — Progress Notes (Signed)
PHARMACIST - PHYSICIAN COMMUNICATION  DR:   Jerral Ralph   CONCERNING: IV to Oral Route Change Policy  RECOMMENDATION: This patient is receiving protonix by the intravenous route.  Based on criteria approved by the Pharmacy and Therapeutics Committee, the intravenous medication(s) is/are being converted to the equivalent oral dose form(s).   DESCRIPTION: These criteria include: The patient is eating (either orally or via tube) and/or has been taking other orally administered medications for a least 24 hours The patient has no evidence of active gastrointestinal bleeding or impaired GI absorption (gastrectomy, short bowel, patient on TNA or NPO).  If you have questions about this conversion, please contact the Pharmacy Department  []   [phone] )  [facility] []   [phone] )  [facility] [x]   [phone] )  [name] []   [phone] )  [facility] []   [phone] )  [facility]

## 2023-02-09 NOTE — Progress Notes (Signed)
PROGRESS NOTE        PATIENT DETAILS Name: Susan [name] Age: 72 y.o. Sex: female Date of Birth: [birth year] Admit Date: 02/05/2023 Admitting Physician Alan Mulder, MD EXB:[id number], Priscille Heidelberg, MD  Brief Summary: Patient is a 72 y.o.  female with history of ESRD on HD TTS, DM-2, PAF, CAD, HFrEF, HTN, PAD s/p right BKA-presented with nausea/vomiting in the setting of hyperosmolar hyperglycemic state.  Significant events: 9/12>> admit to [facility]  Significant studies: 9/14>> CT abdomen: No SBO  Significant microbiology data: None  Procedures: None  Consults: Nephrology  Subjective: Better-no vomiting x 2 days.  Asking diet to be advanced.  Objective: Vitals: Blood pressure 123/63, pulse 75, temperature (!) 97.3 F (36.3 C), temperature source Oral, resp. rate 20, height 5\' 5"  (1.651 m), weight 76.8 kg, SpO2 99%.   Exam: Gen Exam:Alert awake-not in any distress HEENT:atraumatic, normocephalic Chest: B/L clear to auscultation anteriorly CVS:S1S2 regular Abdomen:soft non tender, non distended Extremities:no edema-s/p right BKA Neurology: Non focal Skin: no rash  Pertinent Labs/Radiology:    Latest Ref Rng & Units 02/07/2023    8:10 AM 02/06/2023    7:26 AM 02/05/2023    1:51 PM  CBC  WBC 4.0 - 10.5 K/uL 5.8  6.6    Hemoglobin 12.0 - 15.0 g/dL 40.1  02.7  25.3   Hematocrit 36.0 - 46.0 % 35.0  35.9  41.0   Platelets 150 - 400 K/uL 221  215      Lab Results  Component Value Date   NA 133 (L) 02/08/2023   K 3.7 02/08/2023   CL 96 (L) 02/08/2023   CO2 27 02/08/2023      Assessment/Plan: Hyperosmolar hyperglycemic state Required insulin infusion-has been transitioned to SQ insulin over night  DM-2 with uncontrolled hyperglycemia (A1c 7.9 on 4/3) Mild hypoglycemic event earlier this morning -diet being advanced to full liquids Decrease Semglee to 8 units daily  Continue extra sensitive SSI Allow some amount of permissive  hyperglycemia  Recent Labs    02/08/23 2137 02/09/23 0815 02/09/23 0833  GLUCAP 143* 58* 72     Nausea/vomiting likely due to gastroparesis flare CT abdomen on 9/14 negative for SBO Vomiting improved after keeping n.p.o. and 3 doses of Reglan-no vomiting x 2 days Tolerating clears-advance to full liquids Counseled extensively regarding importance of small portion meals  Slowly advance diet.    Asymptomatic bacteriuria UA dirty but no symptoms-makes very little urine No longer on Cefepime-UTI ruled out.  Chronic HFrEF Volume removal with HD  CAD No anginal symptoms Blocker/statin Suspect not on ASA due to being on Eliquis.  PAF Telemetry monitoring Coreg/amiodarone Eliquis  ESRD on HD TTS Nephrology following  Hypokalemia Replete/recheck  Normocytic anemia Secondary to CKD Follow CBC and transfuse if significant drop  PAD-s/p right BKA  Depression/anxiety Relatively stable Effexor/Remeron/Klonopin  Hypothyroidism Synthroid TSH on 8/22 stable  Debility/deconditioning PT/OT eval  Chronic venous ulcer-left lateral/posterior leg Present prior to admission Follows with Dr. Lajoyce Corners Continue dressing changes per wound care team.  BMI: Estimated body mass index is 28.18 kg/m as calculated from the following:   Height as of this encounter: 5\' 5"  (1.651 m).   Weight as of this encounter: 76.8 kg.   Code status:   Code Status: Full Code   DVT Prophylaxis: SCDs Start: 02/05/23 1719 apixaban (ELIQUIS) tablet 5 mg    Family  Communication: None at bedside   Disposition Plan: Status is: Inpatient Remains inpatient appropriate because:    Planned Discharge Destination:Home   Diet: Diet Order             Diet full liquid Fluid consistency: Thin  Diet effective now                     Antimicrobial agents: Anti-infectives (From admission, onward)    Start     Dose/Rate Route Frequency Ordered Stop   02/06/23 1600  ceFEPIme (MAXIPIME) 1 g  in sodium chloride 0.9 % 100 mL IVPB  Status:  Discontinued        1 g 200 mL/hr over 30 Minutes Intravenous Every 24 hours 02/05/23 2044 02/06/23 0935   02/05/23 2130  ceFEPIme (MAXIPIME) 1 g in sodium chloride 0.9 % 100 mL IVPB  Status:  Discontinued        1 g 200 mL/hr over 30 Minutes Intravenous Every 24 hours 02/05/23 2041 02/05/23 2044   02/05/23 1630  ceFEPIme (MAXIPIME) 2 g in sodium chloride 0.9 % 100 mL IVPB        2 g 200 mL/hr over 30 Minutes Intravenous  Once 02/05/23 1625 02/05/23 1737        MEDICATIONS: Scheduled Meds:  (feeding supplement) PROSource Plus  30 mL Oral BID BM   amiodarone  200 mg Oral Daily   apixaban  5 mg Oral BID   atorvastatin  80 mg Oral Daily   calcitRIOL  0.75 mcg Oral Q T,Th,Sa-HD   carvedilol  6.25 mg Oral BID WC   Chlorhexidine Gluconate Cloth  6 each Topical Q0600   clonazePAM  0.5 mg Oral BID   ferric citrate  210 mg Oral TID WC   insulin aspart  0-6 Units Subcutaneous TID WC   insulin aspart  2 Units Subcutaneous TID WC   insulin glargine-yfgn  12 Units Subcutaneous Q24H   levothyroxine  75 mcg Oral Q0600   mirtazapine  7.5 mg Oral QHS   pantoprazole (PROTONIX) IV  40 mg Intravenous Q12H   polyethylene glycol  17 g Oral Daily   potassium chloride  40 mEq Oral Once   venlafaxine XR  75 mg Oral Q breakfast   Continuous Infusions:   PRN Meds:.acetaminophen **OR** acetaminophen, bisacodyl, dextrose, methocarbamol, midodrine, ondansetron **OR** ondansetron (ZOFRAN) IV, trimethobenzamide   I have personally reviewed following labs and imaging studies  LABORATORY DATA: CBC: Recent Labs  Lab 02/05/23 1114 02/05/23 1351 02/06/23 0726 02/07/23 0810  WBC 6.9  --  6.6 5.8  NEUTROABS  --   --   --  4.2  HGB 11.7* 13.9 10.6* 10.8*  HCT 40.4 41.0 35.9* 35.0*  MCV 77.7*  --  76.9* 77.3*  PLT 237  --  215 221    Basic Metabolic Panel: Recent Labs  Lab 02/05/23 1543 02/05/23 2151 02/06/23 0726 02/07/23 0417 02/08/23 1020   NA 127* 128* 131* 132* 133*  K 4.1 3.4* 3.6 3.1* 3.7  CL 89* 92* 95* 95* 96*  CO2 22 22 21* 27 27  GLUCOSE 593* 228* 78 259* 171*  BUN 45* 49* 53* 20 16  CREATININE 4.34* 4.39* 4.62* 2.90* 2.84*  CALCIUM 9.5 9.2 9.3 8.4* 9.2  MG  --   --  1.7 1.9 2.5*  PHOS 3.3  --  3.7 2.4* 4.0    GFR: Estimated Creatinine Clearance: 18.1 mL/min (A) (by C-G formula based on SCr of 2.84 mg/dL (H)).  Liver  Function Tests: Recent Labs  Lab 02/05/23 1114 02/06/23 0726 02/07/23 0417 02/08/23 1020  AST 21  --   --   --   ALT 21  --   --   --   ALKPHOS 222*  --   --   --   BILITOT 0.7  --   --   --   PROT 7.4  --   --   --   ALBUMIN 3.5 3.0* 2.5* 3.0*   Recent Labs  Lab 02/05/23 1114  LIPASE 23   No results for input(s): "AMMONIA" in the last 168 hours.  Coagulation Profile: No results for input(s): "INR", "PROTIME" in the last 168 hours.  Cardiac Enzymes: No results for input(s): "CKTOTAL", "CKMB", "CKMBINDEX", "TROPONINI" in the last 168 hours.  BNP (last 3 results) No results for input(s): "PROBNP" in the last 8760 hours.  Lipid Profile: No results for input(s): "CHOL", "HDL", "LDLCALC", "TRIG", "CHOLHDL", "LDLDIRECT" in the last 72 hours.  Thyroid Function Tests: No results for input(s): "TSH", "T4TOTAL", "FREET4", "T3FREE", "THYROIDAB" in the last 72 hours.  Anemia Panel: No results for input(s): "VITAMINB12", "FOLATE", "FERRITIN", "TIBC", "IRON", "RETICCTPCT" in the last 72 hours.  Urine analysis:    Component Value Date/Time   COLORURINE AMBER (A) 02/05/2023 1418   APPEARANCEUR TURBID (A) 02/05/2023 1418   LABSPEC 1.015 02/05/2023 1418   PHURINE 6.0 02/05/2023 1418   GLUCOSEU >=500 (A) 02/05/2023 1418   HGBUR MODERATE (A) 02/05/2023 1418   BILIRUBINUR NEGATIVE 02/05/2023 1418   BILIRUBINUR negative 06/05/2020 1334   KETONESUR 5 (A) 02/05/2023 1418   PROTEINUR 100 (A) 02/05/2023 1418   UROBILINOGEN 0.2 06/05/2020 1334   UROBILINOGEN 0.2 04/01/2014 1659   NITRITE  NEGATIVE 02/05/2023 1418   LEUKOCYTESUR LARGE (A) 02/05/2023 1418    Sepsis Labs: Lactic Acid, Venous    Component Value Date/Time   LATICACIDVEN 0.9 11/20/2022 0116    MICROBIOLOGY: No results found for this or any previous visit (from the past 240 hour(s)).  RADIOLOGY STUDIES/RESULTS: CT ABDOMEN PELVIS W CONTRAST  Result Date: 02/07/2023 CLINICAL DATA:  Persistent vomiting, evaluate for small bowel obstruction EXAM: CT ABDOMEN AND PELVIS WITH CONTRAST TECHNIQUE: Multidetector CT imaging of the abdomen and pelvis was performed using the standard protocol following bolus administration of intravenous contrast. RADIATION DOSE REDUCTION: This exam was performed according to the departmental dose-optimization program which includes automated exposure control, adjustment of the mA and/or kV according to patient size and/or use of iterative reconstruction technique. CONTRAST:  75mL OMNIPAQUE IOHEXOL 350 MG/ML SOLN COMPARISON:  01/20/2023 FINDINGS: Lower chest: Small bilateral pleural effusions associated bilateral lower lobe atelectasis. Hepatobiliary: Calcified hepatic granulomata. Liver is otherwise within normal limits. Layering small gallstones (series 3/image 28), without associated inflammatory changes. No intrahepatic or extrahepatic ductal dilatation. Pancreas: Within normal limits Spleen: Sequela of prior splenic infarcts. Adrenals/Urinary Tract: Adrenal glands are within normal limits. 3.1 cm right lower pole renal sinus cyst, measuring simple fluid density, benign (Bosniak I). No follow-up is recommended. Left kidney is within normal limits.  No hydronephrosis. Bladder is within normal limits. Stomach/Bowel: Stomach is underdistended but grossly unremarkable. No evidence of bowel obstruction. Normal appendix (series 3/image 58). Mild sigmoid diverticulosis, without evidence of diverticulitis. Vascular/Lymphatic: No evidence of abdominal aortic aneurysm. Atherosclerotic calcifications of the  abdominal aorta and branch vessels, although vessels remain patent. No suspicious abdominopelvic lymphadenopathy. Reproductive: Status post hysterectomy. No adnexal masses. Other: No abdominopelvic ascites. Musculoskeletal: Degenerative changes of the visualized thoracolumbar spine. IMPRESSION: No evidence of bowel obstruction. Cholelithiasis,  without associated inflammatory changes. Small bilateral pleural effusions associated bilateral lower lobe atelectasis. Electronically Signed   By: Charline Bills M.D.   On: 02/07/2023 18:25     LOS: 4 days   Jeoffrey Massed, MD  [facility]    To contact the attending provider between 7A-7P or the covering provider during after hours 7P-7A, please log into the web site [email or web address] and access using universal [place] password for that web site. If you do not have the password, please call the hospital operator.  02/09/2023, 10:01 AM

## 2023-02-09 NOTE — Care Management Important Message (Signed)
Important Message  Patient Details  Name: Susan [name] MRN: [id number] Date of Birth: [birth year]   Medicare Important Message Given:  Yes     [name] 02/09/2023, 4:40 PM

## 2023-02-09 NOTE — Consult Note (Signed)
WOC Nurse Consult Note: patient with chronic venous ulcer L lateral/posterior leg followed by Dr. Lajoyce Corners.  Last seen by Dr. Lajoyce Corners 8/19 at which time ulcer was debrided and Kerecis skin graft placed; his last note states dry dressing only  Reason for Consult: L posterior leg wound  Wound type: full thickness post debridement and application of skin graft  Pressure Injury POA: NA  Measurement: 3 cm x 1 cm x 0.1 cm  Wound bed: 100% pink moist  Drainage (amount, consistency, odor) minimal serosanguinous  Periwound: dry scaly skin  Dressing procedure/placement/frequency: Clean with L lateral leg wound with NS, apply a dry gauze dressing  daily and secure with silicone foam.  May lift foam daily to replace gauze.  Change foam dressing q3 days and prn soiling.  POC discussed with bedside nurse. WOC team will not follow at this time. Re-consult if further needs arise.   Thank you,    Priscella Mann MSN, RN-BC, [employer] [phone]

## 2023-02-09 NOTE — Plan of Care (Signed)
Problem: Education: Goal: Knowledge of General Education information will improve Description: Including pain rating scale, medication(s)/side effects and non-pharmacologic comfort measures Outcome: Progressing   Problem: Clinical Measurements: Goal: Ability to maintain clinical measurements within normal limits will improve Outcome: Progressing Goal: Will remain free from infection Outcome: Progressing   Problem: Activity: Goal: Risk for activity intolerance will decrease Outcome: Progressing   Problem: Safety: Goal: Ability to remain free from injury will improve Outcome: Progressing   Problem: Skin Integrity: Goal: Risk for impaired skin integrity will decrease Outcome: Progressing

## 2023-02-09 NOTE — Progress Notes (Signed)
[facility] Progress Note   Subjective:  Seen in room -didn't sleep well. Denies cp/dyspnea. Breathing ok. Trying liquids today   Objective Vitals:   02/09/23 0000 02/09/23 0400 02/09/23 0538 02/09/23 0800  BP: (!) 121/48 (!) 112/93 123/63   Pulse: 72 72 75   Resp: 20 16 18 20   Temp: 98 F (36.7 C) 98 F (36.7 C) 97.7 F (36.5 C) (!) 97.3 F (36.3 C)  TempSrc: Oral Oral Oral Oral  SpO2: 100% 98% 99%   Weight:      Height:       Physical Exam General: Chronically ill appearing woman, NAD. Nasal O2 in place. Heart: RRR; no murmur Lungs: CTA anteriorly Abdomen: soft, non-tender Extremities: R BKA, 1+ LLE edema Dialysis Access: [facility]  Additional Objective Labs: Basic Metabolic Panel: Recent Labs  Lab 02/06/23 0726 02/07/23 0417 02/08/23 1020  NA 131* 132* 133*  K 3.6 3.1* 3.7  CL 95* 95* 96*  CO2 21* 27 27  GLUCOSE 78 259* 171*  BUN 53* 20 16  CREATININE 4.62* 2.90* 2.84*  CALCIUM 9.3 8.4* 9.2  PHOS 3.7 2.4* 4.0   Liver Function Tests: Recent Labs  Lab 02/05/23 1114 02/06/23 0726 02/07/23 0417 02/08/23 1020  AST 21  --   --   --   ALT 21  --   --   --   ALKPHOS 222*  --   --   --   BILITOT 0.7  --   --   --   PROT 7.4  --   --   --   ALBUMIN 3.5 3.0* 2.5* 3.0*   Recent Labs  Lab 02/05/23 1114  LIPASE 23   CBC: Recent Labs  Lab 02/05/23 1114 02/05/23 1351 02/06/23 0726 02/07/23 0810  WBC 6.9  --  6.6 5.8  NEUTROABS  --   --   --  4.2  HGB 11.7* 13.9 10.6* 10.8*  HCT 40.4 41.0 35.9* 35.0*  MCV 77.7*  --  76.9* 77.3*  PLT 237  --  215 221   Studies/Results: CT ABDOMEN PELVIS W CONTRAST  Result Date: 02/07/2023 CLINICAL DATA:  Persistent vomiting, evaluate for small bowel obstruction EXAM: CT ABDOMEN AND PELVIS WITH CONTRAST TECHNIQUE: Multidetector CT imaging of the abdomen and pelvis was performed using the standard protocol following bolus administration of intravenous contrast. RADIATION DOSE REDUCTION: This exam was  performed according to the departmental dose-optimization program which includes automated exposure control, adjustment of the mA and/or kV according to patient size and/or use of iterative reconstruction technique. CONTRAST:  75mL OMNIPAQUE IOHEXOL 350 MG/ML SOLN COMPARISON:  01/20/2023 FINDINGS: Lower chest: Small bilateral pleural effusions associated bilateral lower lobe atelectasis. Hepatobiliary: Calcified hepatic granulomata. Liver is otherwise within normal limits. Layering small gallstones (series 3/image 28), without associated inflammatory changes. No intrahepatic or extrahepatic ductal dilatation. Pancreas: Within normal limits Spleen: Sequela of prior splenic infarcts. Adrenals/Urinary Tract: Adrenal glands are within normal limits. 3.1 cm right lower pole renal sinus cyst, measuring simple fluid density, benign (Bosniak I). No follow-up is recommended. Left kidney is within normal limits.  No hydronephrosis. Bladder is within normal limits. Stomach/Bowel: Stomach is underdistended but grossly unremarkable. No evidence of bowel obstruction. Normal appendix (series 3/image 58). Mild sigmoid diverticulosis, without evidence of diverticulitis. Vascular/Lymphatic: No evidence of abdominal aortic aneurysm. Atherosclerotic calcifications of the abdominal aorta and branch vessels, although vessels remain patent. No suspicious abdominopelvic lymphadenopathy. Reproductive: Status post hysterectomy. No adnexal masses. Other: No abdominopelvic ascites. Musculoskeletal: Degenerative changes of the  visualized thoracolumbar spine. IMPRESSION: No evidence of bowel obstruction. Cholelithiasis, without associated inflammatory changes. Small bilateral pleural effusions associated bilateral lower lobe atelectasis. Electronically Signed   By: Charline Bills M.D.   On: 02/07/2023 18:25    Medications:   (feeding supplement) PROSource Plus  30 mL Oral BID BM   amiodarone  200 mg Oral Daily   apixaban  5 mg Oral BID    atorvastatin  80 mg Oral Daily   calcitRIOL  0.75 mcg Oral Q T,Th,Sa-HD   carvedilol  6.25 mg Oral BID WC   Chlorhexidine Gluconate Cloth  6 each Topical Q0600   clonazePAM  0.5 mg Oral BID   ferric citrate  210 mg Oral TID WC   insulin aspart  0-6 Units Subcutaneous TID WC   insulin aspart  2 Units Subcutaneous TID WC   insulin glargine-yfgn  12 Units Subcutaneous Q24H   levothyroxine  75 mcg Oral Q0600   mirtazapine  7.5 mg Oral QHS   pantoprazole (PROTONIX) IV  40 mg Intravenous Q12H   polyethylene glycol  17 g Oral Daily   potassium chloride  40 mEq Oral Once   venlafaxine XR  75 mg Oral Q breakfast    Dialysis Orders: TTS [facility] 4hr, 400/800, EDW 79kg, 2K/2Ca bath, TDC, Heparin 4000 + 2000 midrun - Aranesp given 9/3 during recent admit - Calcitriol 0.60mcg PO q HD - Binder recently changed from renvela to [place] Just discharged 02/02/2023   Assessment/Plan: 1. Intractable nausea/vomiting: Just discharged with same, readmitted 9/12. ?Felt due to colitis and/or gastroparesis, uncontrolled BS on admit - improved. S/p 3 doses of Reglan  -advancing to liquid diet  2. ESRD: Usual TTS schedule - back on usual schedule now - next HD 9/17. 3. HTN/volume: BP variable, does have edema on exam as well as hypoNa. UF as tolerated. 4. Anemia: Hgb 10.8 - follow without ESA for now. 5. Secondary hyperparathyroidism: CorrCa ok, Phos low. Reducing Auryxia to 1/meals. 6. Nutrition: Alb low, adding supps. 7. T2DM: BS > 600 on admit - better with insulin. 8. CAD 9. pAF: On Eliquis    Kenzli Barritt Susann Givens PA-C [facility] 02/09/2023,9:26 AM

## 2023-02-10 DIAGNOSIS — I480 Paroxysmal atrial fibrillation: Secondary | ICD-10-CM | POA: Diagnosis not present

## 2023-02-10 DIAGNOSIS — N186 End stage renal disease: Secondary | ICD-10-CM | POA: Diagnosis not present

## 2023-02-10 DIAGNOSIS — E1100 Type 2 diabetes mellitus with hyperosmolarity without nonketotic hyperglycemic-hyperosmolar coma (NKHHC): Secondary | ICD-10-CM | POA: Diagnosis not present

## 2023-02-10 DIAGNOSIS — I5020 Unspecified systolic (congestive) heart failure: Secondary | ICD-10-CM | POA: Diagnosis not present

## 2023-02-10 LAB — RENAL FUNCTION PANEL
Albumin: 2.6 g/dL — ABNORMAL LOW (ref 3.5–5.0)
Anion gap: 8 (ref 5–15)
BUN: 35 mg/dL — ABNORMAL HIGH (ref 8–23)
CO2: 26 mmol/L (ref 22–32)
Calcium: 8.2 mg/dL — ABNORMAL LOW (ref 8.9–10.3)
Chloride: 93 mmol/L — ABNORMAL LOW (ref 98–111)
Creatinine, Ser: 5.07 mg/dL — ABNORMAL HIGH (ref 0.44–1.00)
GFR, Estimated: 8 mL/min — ABNORMAL LOW (ref >60)
Glucose, Bld: 326 mg/dL — ABNORMAL HIGH (ref 70–99)
Phosphorus: 4.9 mg/dL — ABNORMAL HIGH (ref 2.5–4.6)
Potassium: 3.3 mmol/L — ABNORMAL LOW (ref 3.5–5.1)
Sodium: 127 mmol/L — ABNORMAL LOW (ref 135–145)

## 2023-02-10 LAB — CBC
HCT: 34.8 % — ABNORMAL LOW (ref 36.0–46.0)
Hemoglobin: 10.3 g/dL — ABNORMAL LOW (ref 12.0–15.0)
MCH: 22.6 pg — ABNORMAL LOW (ref 26.0–34.0)
MCHC: 29.6 g/dL — ABNORMAL LOW (ref 30.0–36.0)
MCV: 76.5 fL — ABNORMAL LOW (ref 80.0–100.0)
Platelets: 198 10*3/uL (ref 150–400)
RBC: 4.55 MIL/uL (ref 3.87–5.11)
RDW: 19.3 % — ABNORMAL HIGH (ref 11.5–15.5)
WBC: 6.1 10*3/uL (ref 4.0–10.5)
nRBC: 0 % (ref 0.0–0.2)

## 2023-02-10 LAB — GLUCOSE, CAPILLARY
Glucose-Capillary: 308 mg/dL — ABNORMAL HIGH (ref 70–99)
Glucose-Capillary: 191 mg/dL — ABNORMAL HIGH (ref 70–99)
Glucose-Capillary: 184 mg/dL — ABNORMAL HIGH (ref 70–99)

## 2023-02-10 MED ORDER — FLUCONAZOLE IN SODIUM CHLORIDE 200-0.9 MG/100ML-% IV SOLN
200.0000 mg | INTRAVENOUS | Status: DC
  Administered 2023-02-10 – 2023-02-11 (×2): 200 mg via INTRAVENOUS
  Filled 2023-02-10 (×3): qty 100

## 2023-02-10 MED ORDER — ALTEPLASE 2 MG IJ SOLR: 2.0000 mg | Freq: Once | INTRAMUSCULAR | Status: DC | PRN

## 2023-02-10 MED ORDER — LIDOCAINE-PRILOCAINE 2.5-2.5 % EX CREA: 1.0000 | TOPICAL_CREAM | CUTANEOUS | Status: DC | PRN

## 2023-02-10 MED ORDER — HEPARIN SODIUM (PORCINE) 1000 UNIT/ML DIALYSIS: 1000.0000 [IU] | INTRAMUSCULAR | Status: DC | PRN

## 2023-02-10 MED ORDER — ANTICOAGULANT SODIUM CITRATE 4% (200MG/5ML) IV SOLN: 5.0000 mL | Status: DC | PRN

## 2023-02-10 MED ORDER — LIDOCAINE HCL (PF) 1 % IJ SOLN: 5.0000 mL | INTRAMUSCULAR | Status: DC | PRN

## 2023-02-10 MED ORDER — PENTAFLUOROPROP-TETRAFLUOROETH EX AERO: 1.0000 | INHALATION_SPRAY | CUTANEOUS | Status: DC | PRN

## 2023-02-10 MED ORDER — HEPARIN SODIUM (PORCINE) 1000 UNIT/ML DIALYSIS
4000.0000 [IU] | Freq: Once | INTRAMUSCULAR | Status: AC
  Administered 2023-02-10: 4000 [IU] via INTRAVENOUS_CENTRAL
  Filled 2023-02-10: qty 4

## 2023-02-10 MED ORDER — HEPARIN SODIUM (PORCINE) 1000 UNIT/ML IJ SOLN
3800.0000 [IU] | Freq: Once | INTRAMUSCULAR | Status: AC
  Administered 2023-02-10: 3800 [IU]
  Filled 2023-02-10: qty 4

## 2023-02-10 MED ORDER — METOCLOPRAMIDE HCL 5 MG/ML IJ SOLN
10.0000 mg | Freq: Three times a day (TID) | INTRAMUSCULAR | Status: AC
  Administered 2023-02-10 – 2023-02-11 (×2): 10 mg via INTRAVENOUS
  Filled 2023-02-10 (×3): qty 2

## 2023-02-10 MED ORDER — HEPARIN SODIUM (PORCINE) 1000 UNIT/ML IJ SOLN
2000.0000 [IU] | Freq: Once | INTRAMUSCULAR | Status: AC
  Administered 2023-02-12: 4000 [IU] via INTRAVENOUS
  Filled 2023-02-10: qty 2

## 2023-02-10 NOTE — Progress Notes (Signed)
Received  patient in bed.Awake alert and oriented x 4.Consent verified.  Access used : Right HD catheter that worked well.Dressing on date.  Medications given.Heparin 3800 units pre-run bolus.                               Midodrine 10 mg.  Duration of treatment : 3.5 hours.  Fluid removed. Achieved 2 liters goal.  Hemo comment :Tolerated treatment.  Hand off to the patient's nurse.

## 2023-02-10 NOTE — Plan of Care (Signed)
Patient has some nausea but feels some better.

## 2023-02-10 NOTE — Progress Notes (Signed)
PT Cancellation Note  Patient Details Name: Susan [name] MRN: [id number] DOB: February 09, 1951   Cancelled Treatment:    Reason Eval/Treat Not Completed: Patient at procedure or test/unavailable  Currently in HD;  Will follow up later today as time allows;  Otherwise, will follow up for PT tomorrow;   Thank you,  Van Clines, PT  Acute Rehabilitation Services Office [phone]     Levi Aland 02/10/2023, 8:48 AM

## 2023-02-10 NOTE — Progress Notes (Signed)
Report given to hemo for patient going to hemo this am.

## 2023-02-10 NOTE — Plan of Care (Signed)

## 2023-02-10 NOTE — Progress Notes (Signed)
Patient to hemo via bed.

## 2023-02-10 NOTE — Progress Notes (Signed)
OT Cancellation Note  Patient Details Name: Susan [name] MRN: [id number] DOB: Jul 05, 1950   Cancelled Treatment:    Reason Eval/Treat Not Completed: Patient at procedure or test/ unavailable (Patient off unit in HD. OT to attempt later today as schedule permits) Alfonse Flavors, OTA Acute Rehabilitation Services  Office [phone]  Dewain Penning 02/10/2023, 8:36 AM

## 2023-02-10 NOTE — Plan of Care (Signed)
  Problem: Activity: Goal: Ability to return to baseline activity level will improve Outcome: Progressing   Problem: Respiratory: Goal: Will regain and/or maintain adequate ventilation Outcome: Progressing   Problem: Education: Goal: Knowledge of General Education information will improve Description: Including pain rating scale, medication(s)/side effects and non-pharmacologic comfort measures Outcome: Progressing   Problem: Clinical Measurements: Goal: Will remain free from infection Outcome: Progressing   Problem: Safety: Goal: Ability to remain free from injury will improve Outcome: Progressing   Problem: Skin Integrity: Goal: Risk for impaired skin integrity will decrease Outcome: Progressing

## 2023-02-10 NOTE — Progress Notes (Signed)
PROGRESS NOTE        PATIENT DETAILS Name: Susan [name] Age: 72 y.o. Sex: female Date of Birth: [birth year] Admit Date: 02/05/2023 Admitting Physician Alan Mulder, MD [id number], Priscille Heidelberg, MD  Brief Summary: Patient is a 72 y.o.  female with history of ESRD on HD TTS, DM-2, PAF, CAD, HFrEF, HTN, PAD s/p right BKA-presented with nausea/vomiting in the setting of hyperosmolar hyperglycemic state.  Significant events: 9/12>> admit to [facility]  Significant studies: 9/14>> CT abdomen: No SBO  Significant microbiology data: None  Procedures: None  Consults: Nephrology  Subjective: Vomited x 2 last night.  She has not vomited in 2 days.  Last was yesterday.  No abdominal pain  Objective: Vitals: Blood pressure (!) 133/46, pulse 75, temperature 98.2 F (36.8 C), temperature source Oral, resp. rate 11, height 5\' 5"  (1.651 m), weight 76.9 kg, SpO2 99%.   Exam: Gen Exam:Alert awake-not in any distress HEENT:atraumatic, normocephalic Chest: B/L clear to auscultation anteriorly CVS:S1S2 regular Abdomen:soft non tender, non distended Extremities:no edema Neurology: Non focal-s/p right BKA Skin: no rash  Pertinent Labs/Radiology:    Latest Ref Rng & Units 02/10/2023   10:02 AM 02/07/2023    8:10 AM 02/06/2023    7:26 AM  CBC  WBC 4.0 - 10.5 K/uL 6.1  5.8  6.6   Hemoglobin 12.0 - 15.0 g/dL 29.5  18.8  41.6   Hematocrit 36.0 - 46.0 % 34.8  35.0  35.9   Platelets 150 - 400 K/uL 198  221  215     Lab Results  Component Value Date   NA 127 (L) 02/10/2023   K 3.3 (L) 02/10/2023   CL 93 (L) 02/10/2023   CO2 26 02/10/2023      Assessment/Plan: Hyperosmolar hyperglycemic state Required insulin infusion-has been transitioned to SQ insulin over night  DM-2 with uncontrolled hyperglycemia (A1c 7.9 on 4/3) No further hypoglycemic episodes However vomiting again  CBGs now on the higher side-plans are to allow some amount of permissive  hyperglycemia-will increase Semglee back to 12 units Continue with sensitive SSI   Recent Labs    02/09/23 1932 02/09/23 2209 02/10/23 0806  GLUCAP 236* 304* 308*     Nausea/vomiting likely due to gastroparesis flare CT abdomen on 9/14 negative for SBO Vomiting had resolved-however reoccurred after 2 days last night Some nausea is chronic per patient Discussed with primary gastroenterologist-Dr. Elnoria Howard over the phone-patient recently had esophageal candidiasis-recommends retreating with IV Diflucan.  May need suppressive therapy. Will restart IV Reglan x 3 doses-will have to be cautious as QTc prolonged. Continue to emphasize small portion meals.  Asymptomatic bacteriuria UA dirty but no symptoms-makes very little urine No longer on Cefepime-UTI ruled out.  Chronic HFrEF Volume removal with HD  CAD No anginal symptoms Blocker/statin Suspect not on ASA due to being on Eliquis.  PAF Telemetry monitoring Coreg/amiodarone Eliquis  ESRD on HD TTS Nephrology following  Hypokalemia Replete/recheck  Hyponatremia Due to excess volume-should improve with HD Follow electrolytes periodically  Normocytic anemia Secondary to CKD Follow CBC and transfuse if significant drop  PAD-s/p right BKA  Depression/anxiety Relatively stable Effexor/Remeron/Klonopin  Hypothyroidism Synthroid TSH on 8/22 stable  Debility/deconditioning PT/OT eval  Chronic venous ulcer-left lateral/posterior leg Present prior to admission Follows with Dr. Lajoyce Corners Continue dressing changes per wound care team.  BMI: Estimated body mass index is 28.21 kg/m as  calculated from the following:   Height as of this encounter: 5\' 5"  (1.651 m).   Weight as of this encounter: 76.9 kg.   Code status:   Code Status: Full Code   DVT Prophylaxis: SCDs Start: 02/05/23 1719 apixaban (ELIQUIS) tablet 5 mg    Family Communication: None at bedside   Disposition Plan: Status is: Inpatient Remains  inpatient appropriate because:    Planned Discharge Destination:Home   Diet: Diet Order             Diet full liquid Fluid consistency: Thin  Diet effective now                     Antimicrobial agents: Anti-infectives (From admission, onward)    Start     Dose/Rate Route Frequency Ordered Stop   02/10/23 1100  fluconazole (DIFLUCAN) IVPB 200 mg        200 mg 100 mL/hr over 60 Minutes Intravenous Every 24 hours 02/10/23 1045     02/06/23 1600  ceFEPIme (MAXIPIME) 1 g in sodium chloride 0.9 % 100 mL IVPB  Status:  Discontinued        1 g 200 mL/hr over 30 Minutes Intravenous Every 24 hours 02/05/23 2044 02/06/23 0935   02/05/23 2130  ceFEPIme (MAXIPIME) 1 g in sodium chloride 0.9 % 100 mL IVPB  Status:  Discontinued        1 g 200 mL/hr over 30 Minutes Intravenous Every 24 hours 02/05/23 2041 02/05/23 2044   02/05/23 1630  ceFEPIme (MAXIPIME) 2 g in sodium chloride 0.9 % 100 mL IVPB        2 g 200 mL/hr over 30 Minutes Intravenous  Once 02/05/23 1625 02/05/23 1737        MEDICATIONS: Scheduled Meds:  (feeding supplement) PROSource Plus  30 mL Oral BID BM   amiodarone  200 mg Oral Daily   apixaban  5 mg Oral BID   atorvastatin  80 mg Oral Daily   calcitRIOL  0.75 mcg Oral Q T,Th,Sa-HD   carvedilol  6.25 mg Oral BID WC   Chlorhexidine Gluconate Cloth  6 each Topical Q0600   clonazePAM  0.5 mg Oral BID   ferric citrate  210 mg Oral TID WC   heparin sodium (porcine)  2,000 Units Intravenous Once   heparin sodium (porcine)  3,800 Units Intracatheter Once   insulin aspart  0-6 Units Subcutaneous TID WC   insulin aspart  2 Units Subcutaneous TID WC   insulin glargine-yfgn  8 Units Subcutaneous Q24H   levothyroxine  75 mcg Oral Q0600   metoCLOPramide (REGLAN) injection  10 mg Intravenous TID AC   mirtazapine  7.5 mg Oral QHS   pantoprazole  40 mg Oral BID   polyethylene glycol  17 g Oral Daily   potassium chloride  40 mEq Oral Once   venlafaxine XR  75 mg Oral Q  breakfast   Continuous Infusions:  anticoagulant sodium citrate     fluconazole (DIFLUCAN) IV      PRN Meds:.acetaminophen **OR** acetaminophen, alteplase, anticoagulant sodium citrate, bisacodyl, dextrose, heparin, heparin, lidocaine (PF), lidocaine-prilocaine, methocarbamol, midodrine, pentafluoroprop-tetrafluoroeth, trimethobenzamide   I have personally reviewed following labs and imaging studies  LABORATORY DATA: CBC: Recent Labs  Lab 02/05/23 1114 02/05/23 1351 02/06/23 0726 02/07/23 0810 02/10/23 1002  WBC 6.9  --  6.6 5.8 6.1  NEUTROABS  --   --   --  4.2  --   HGB 11.7* 13.9 10.6* 10.8* 10.3*  HCT  40.4 41.0 35.9* 35.0* 34.8*  MCV 77.7*  --  76.9* 77.3* 76.5*  PLT 237  --  215 221 198    Basic Metabolic Panel: Recent Labs  Lab 02/05/23 1543 02/05/23 2151 02/06/23 0726 02/07/23 0417 02/08/23 1020 02/10/23 1003  NA 127* 128* 131* 132* 133* 127*  K 4.1 3.4* 3.6 3.1* 3.7 3.3*  CL 89* 92* 95* 95* 96* 93*  CO2 22 22 21* 27 27 26   GLUCOSE 593* 228* 78 259* 171* 326*  BUN 45* 49* 53* 20 16 35*  CREATININE 4.34* 4.39* 4.62* 2.90* 2.84* 5.07*  CALCIUM 9.5 9.2 9.3 8.4* 9.2 8.2*  MG  --   --  1.7 1.9 2.5*  --   PHOS 3.3  --  3.7 2.4* 4.0 4.9*    GFR: Estimated Creatinine Clearance: 10.1 mL/min (A) (by C-G formula based on SCr of 5.07 mg/dL (H)).  Liver Function Tests: Recent Labs  Lab 02/05/23 1114 02/06/23 0726 02/07/23 0417 02/08/23 1020 02/10/23 1003  AST 21  --   --   --   --   ALT 21  --   --   --   --   ALKPHOS 222*  --   --   --   --   BILITOT 0.7  --   --   --   --   PROT 7.4  --   --   --   --   ALBUMIN 3.5 3.0* 2.5* 3.0* 2.6*   Recent Labs  Lab 02/05/23 1114  LIPASE 23   No results for input(s): "AMMONIA" in the last 168 hours.  Coagulation Profile: No results for input(s): "INR", "PROTIME" in the last 168 hours.  Cardiac Enzymes: No results for input(s): "CKTOTAL", "CKMB", "CKMBINDEX", "TROPONINI" in the last 168 hours.  BNP (last  3 results) No results for input(s): "PROBNP" in the last 8760 hours.  Lipid Profile: No results for input(s): "CHOL", "HDL", "LDLCALC", "TRIG", "CHOLHDL", "LDLDIRECT" in the last 72 hours.  Thyroid Function Tests: No results for input(s): "TSH", "T4TOTAL", "FREET4", "T3FREE", "THYROIDAB" in the last 72 hours.  Anemia Panel: No results for input(s): "VITAMINB12", "FOLATE", "FERRITIN", "TIBC", "IRON", "RETICCTPCT" in the last 72 hours.  Urine analysis:    Component Value Date/Time   COLORURINE AMBER (A) 02/05/2023 1418   APPEARANCEUR TURBID (A) 02/05/2023 1418   LABSPEC 1.015 02/05/2023 1418   PHURINE 6.0 02/05/2023 1418   GLUCOSEU >=500 (A) 02/05/2023 1418   HGBUR MODERATE (A) 02/05/2023 1418   BILIRUBINUR NEGATIVE 02/05/2023 1418   BILIRUBINUR negative 06/05/2020 1334   KETONESUR 5 (A) 02/05/2023 1418   PROTEINUR 100 (A) 02/05/2023 1418   UROBILINOGEN 0.2 06/05/2020 1334   UROBILINOGEN 0.2 04/01/2014 1659   NITRITE NEGATIVE 02/05/2023 1418   LEUKOCYTESUR LARGE (A) 02/05/2023 1418    Sepsis Labs: Lactic Acid, Venous    Component Value Date/Time   LATICACIDVEN 0.9 11/20/2022 0116    MICROBIOLOGY: No results found for this or any previous visit (from the past 240 hour(s)).  RADIOLOGY STUDIES/RESULTS: No results found.   LOS: 5 days   Jeoffrey Massed, MD  [facility]    To contact the attending provider between 7A-7P or the covering provider during after hours 7P-7A, please log into the web site [email or web address] and access using universal [place] password for that web site. If you do not have the password, please call the hospital operator.  02/10/2023, 11:53 AM

## 2023-02-10 NOTE — Progress Notes (Signed)
[facility] Progress Note   Subjective:  Seen in [facility]. Still not tolerating much PO.   Objective Vitals:   02/10/23 1030 02/10/23 1100 02/10/23 1130 02/10/23 1201  BP: (!) 133/46 (!) 134/119 (!) 254/234 (!) 119/49  Pulse: 75 78 79 80  Resp: 11 13 12  (!) 9  Temp:      TempSrc:      SpO2: 99% 99% 100% 100%  Weight:      Height:       Physical Exam General: Chronically ill appearing woman, NAD. Nasal O2 in place. Heart: RRR; no murmur Lungs: CTA anteriorly Abdomen: soft, non-tender Extremities: R BKA, 1+ LLE edema Dialysis Access: [facility]  Additional Objective Labs: Basic Metabolic Panel: Recent Labs  Lab 02/07/23 0417 02/08/23 1020 02/10/23 1003  NA 132* 133* 127*  K 3.1* 3.7 3.3*  CL 95* 96* 93*  CO2 27 27 26   GLUCOSE 259* 171* 326*  BUN 20 16 35*  CREATININE 2.90* 2.84* 5.07*  CALCIUM 8.4* 9.2 8.2*  PHOS 2.4* 4.0 4.9*   Liver Function Tests: Recent Labs  Lab 02/05/23 1114 02/06/23 0726 02/07/23 0417 02/08/23 1020 02/10/23 1003  AST 21  --   --   --   --   ALT 21  --   --   --   --   ALKPHOS 222*  --   --   --   --   BILITOT 0.7  --   --   --   --   PROT 7.4  --   --   --   --   ALBUMIN 3.5   < > 2.5* 3.0* 2.6*   < > = values in this interval not displayed.   Recent Labs  Lab 02/05/23 1114  LIPASE 23   CBC: Recent Labs  Lab 02/05/23 1114 02/05/23 1351 02/06/23 0726 02/07/23 0810 02/10/23 1002  WBC 6.9  --  6.6 5.8 6.1  NEUTROABS  --   --   --  4.2  --   HGB 11.7*   < > 10.6* 10.8* 10.3*  HCT 40.4   < > 35.9* 35.0* 34.8*  MCV 77.7*  --  76.9* 77.3* 76.5*  PLT 237  --  215 221 198   < > = values in this interval not displayed.   Studies/Results: No results found.  Medications:  anticoagulant sodium citrate     fluconazole (DIFLUCAN) IV      (feeding supplement) PROSource Plus  30 mL Oral BID BM   amiodarone  200 mg Oral Daily   apixaban  5 mg Oral BID   atorvastatin  80 mg Oral Daily   calcitRIOL  0.75 mcg Oral Q  T,Th,Sa-HD   carvedilol  6.25 mg Oral BID WC   Chlorhexidine Gluconate Cloth  6 each Topical Q0600   clonazePAM  0.5 mg Oral BID   ferric citrate  210 mg Oral TID WC   heparin sodium (porcine)  2,000 Units Intravenous Once   heparin sodium (porcine)  3,800 Units Intracatheter Once   insulin aspart  0-6 Units Subcutaneous TID WC   insulin aspart  2 Units Subcutaneous TID WC   insulin glargine-yfgn  8 Units Subcutaneous Q24H   levothyroxine  75 mcg Oral Q0600   metoCLOPramide (REGLAN) injection  10 mg Intravenous TID AC   mirtazapine  7.5 mg Oral QHS   pantoprazole  40 mg Oral BID   polyethylene glycol  17 g Oral Daily   potassium chloride  40 mEq  Oral Once   venlafaxine XR  75 mg Oral Q breakfast    Dialysis Orders: TTS [facility] 4hr, 400/800, EDW 79kg, 2K/2Ca bath, TDC, Heparin 4000 + 2000 midrun - Aranesp given 9/3 during recent admit - Calcitriol 0.58mcg PO q HD - Binder recently changed from renvela to [place] Just discharged 02/02/2023   Assessment/Plan: 1. Intractable nausea/vomiting: Just discharged with same, readmitted 9/12. ?Felt due to colitis and/or gastroparesis, uncontrolled BS on admit - improved. S/p 3 doses of Reglan  -advancing to liquid diet  2. ESRD: Usual TTS schedule - back on usual schedule now - HD today 3. HTN/volume: BP variable, does have edema on exam as well as hypoNa. UF as tolerated. 4. Anemia: Hgb 10.8 - follow without ESA for now. 5. Secondary hyperparathyroidism: CorrCa ok, Phos low. Reducing Auryxia to 1/meals. 6. Nutrition: Alb low, adding supps. 7. T2DM: BS > 600 on admit - better with insulin. 8. CAD 9. pAF: On Eliquis    Katrice Goel Susann Givens PA-C [facility] 02/10/2023,12:30 PM

## 2023-02-11 DIAGNOSIS — N186 End stage renal disease: Secondary | ICD-10-CM | POA: Diagnosis not present

## 2023-02-11 DIAGNOSIS — I480 Paroxysmal atrial fibrillation: Secondary | ICD-10-CM | POA: Diagnosis not present

## 2023-02-11 DIAGNOSIS — E1100 Type 2 diabetes mellitus with hyperosmolarity without nonketotic hyperglycemic-hyperosmolar coma (NKHHC): Secondary | ICD-10-CM | POA: Diagnosis not present

## 2023-02-11 DIAGNOSIS — I5020 Unspecified systolic (congestive) heart failure: Secondary | ICD-10-CM | POA: Diagnosis not present

## 2023-02-11 LAB — GLUCOSE, CAPILLARY
Glucose-Capillary: 119 mg/dL — ABNORMAL HIGH (ref 70–99)
Glucose-Capillary: 141 mg/dL — ABNORMAL HIGH (ref 70–99)
Glucose-Capillary: 164 mg/dL — ABNORMAL HIGH (ref 70–99)
Glucose-Capillary: 161 mg/dL — ABNORMAL HIGH (ref 70–99)

## 2023-02-11 MED ORDER — ONDANSETRON HCL 4 MG/2ML IJ SOLN
4.0000 mg | Freq: Once | INTRAMUSCULAR | Status: AC
  Administered 2023-02-11: 4 mg via INTRAVENOUS
  Filled 2023-02-11: qty 2

## 2023-02-11 MED ORDER — MAGNESIUM SULFATE IN D5W 1-5 GM/100ML-% IV SOLN
1.0000 g | Freq: Once | INTRAVENOUS | Status: AC
  Administered 2023-02-11: 1 g via INTRAVENOUS
  Filled 2023-02-11: qty 100

## 2023-02-11 MED ORDER — POTASSIUM CHLORIDE CRYS ER 20 MEQ PO TBCR
40.0000 meq | EXTENDED_RELEASE_TABLET | Freq: Once | ORAL | Status: AC
  Administered 2023-02-11: 40 meq via ORAL
  Filled 2023-02-11: qty 2

## 2023-02-11 MED ORDER — METOCLOPRAMIDE HCL 5 MG/ML IJ SOLN
5.0000 mg | Freq: Three times a day (TID) | INTRAMUSCULAR | Status: DC
  Administered 2023-02-11: 5 mg via INTRAVENOUS
  Filled 2023-02-11: qty 2

## 2023-02-11 NOTE — Progress Notes (Signed)
PROGRESS NOTE        PATIENT DETAILS Name: Susan [name] Age: 72 y.o. Sex: female Date of Birth: 1950-07-01 Admit Date: 02/05/2023 Admitting Physician Alan Mulder, MD [id number], Priscille Heidelberg, MD  Brief Summary: Patient is a 72 y.o.  female with history of ESRD on HD TTS, DM-2, PAF, CAD, HFrEF, HTN, PAD s/p right BKA-presented with nausea/vomiting in the setting of hyperosmolar hyperglycemic state.  Significant events: 9/12>> admit to [facility]  Significant studies: 9/14>> CT abdomen: No SBO  Significant microbiology data: None  Procedures: None  Consults: Nephrology  Subjective: No vomiting overnight  Objective: Vitals: Blood pressure (!) 111/36, pulse 69, temperature 98.3 F (36.8 C), temperature source Oral, resp. rate 12, height 5\' 5"  (1.651 m), weight 74.9 kg, SpO2 100%.   Exam: Gen Exam:Alert awake-not in any distress HEENT:atraumatic, normocephalic Chest: B/L clear to auscultation anteriorly CVS:S1S2 regular Abdomen:soft non tender, non distended Extremities: Right BKA Neurology: Non focal Skin: no rash  Pertinent Labs/Radiology:    Latest Ref Rng & Units 02/11/2023    4:09 AM 02/10/2023   10:02 AM 02/07/2023    8:10 AM  CBC  WBC 4.0 - 10.5 K/uL 6.2  6.1  5.8   Hemoglobin 12.0 - 15.0 g/dL 56.2  13.0  86.5   Hematocrit 36.0 - 46.0 % 37.7  34.8  35.0   Platelets 150 - 400 K/uL 199  198  221     Lab Results  Component Value Date   NA 133 (L) 02/11/2023   K 3.4 (L) 02/11/2023   CL 94 (L) 02/11/2023   CO2 27 02/11/2023      Assessment/Plan: Hyperosmolar hyperglycemic state Required insulin infusion-has been transitioned to SQ insulin over night  DM-2 with uncontrolled hyperglycemia (A1c 7.9 on 4/3) CBG stable-Semglee 12 units daily + SSI   Recent Labs    02/10/23 2058 02/11/23 0758 02/11/23 1152  GLUCAP 184* 119* 141*     Nausea/vomiting likely due to gastroparesis flare CT abdomen on 9/14 negative for  SBO Continues to have intermittent nausea and some vomiting-last episode of vomiting on 9/16 Discussed with primary gastroenterologist Dr. Azzie Glatter prior EGD-had significant esophageal candidiasis-recommends retreating with IV Diflucan empirically IV Reglan x 3 doses-has prolonged QTc-will need to be cautious Small portion meals Advance to regular diet  Asymptomatic bacteriuria UA dirty but no symptoms-makes very little urine No longer on Cefepime-UTI ruled out.  Chronic HFrEF Volume removal with HD  CAD No anginal symptoms Blocker/statin Suspect not on ASA due to being on Eliquis.  PAF Telemetry monitoring Coreg/amiodarone Eliquis  ESRD on HD TTS Nephrology following  Hypokalemia Replete/recheck  Hyponatremia Due to excess volume-should improve with HD Follow electrolytes periodically  Normocytic anemia Secondary to CKD Follow CBC and transfuse if significant drop  PAD-s/p right BKA  Depression/anxiety Relatively stable Effexor/Remeron/Klonopin  Hypothyroidism Synthroid TSH on 8/22 stable  Debility/deconditioning PT/OT eval-Home health recommended  Chronic venous ulcer-left lateral/posterior leg Present prior to admission Follows with Dr. Lajoyce Corners Continue dressing changes per wound care team.  BMI: Estimated body mass index is 27.48 kg/m as calculated from the following:   Height as of this encounter: 5\' 5"  (1.651 m).   Weight as of this encounter: 74.9 kg.   Code status:   Code Status: Full Code   DVT Prophylaxis: SCDs Start: 02/05/23 1719 apixaban (ELIQUIS) tablet 5 mg    Family  Communication: None at bedside   Disposition Plan: Status is: Inpatient Remains inpatient appropriate because:    Planned Discharge Destination:Home   Diet: Diet Order             Diet heart healthy/carb modified Fluid consistency: Thin; Fluid restriction: 1200 mL Fluid  Diet effective now                     Antimicrobial  agents: Anti-infectives (From admission, onward)    Start     Dose/Rate Route Frequency Ordered Stop   02/10/23 1100  fluconazole (DIFLUCAN) IVPB 200 mg        200 mg 100 mL/hr over 60 Minutes Intravenous Every 24 hours 02/10/23 1045     02/06/23 1600  ceFEPIme (MAXIPIME) 1 g in sodium chloride 0.9 % 100 mL IVPB  Status:  Discontinued        1 g 200 mL/hr over 30 Minutes Intravenous Every 24 hours 02/05/23 2044 02/06/23 0935   02/05/23 2130  ceFEPIme (MAXIPIME) 1 g in sodium chloride 0.9 % 100 mL IVPB  Status:  Discontinued        1 g 200 mL/hr over 30 Minutes Intravenous Every 24 hours 02/05/23 2041 02/05/23 2044   02/05/23 1630  ceFEPIme (MAXIPIME) 2 g in sodium chloride 0.9 % 100 mL IVPB        2 g 200 mL/hr over 30 Minutes Intravenous  Once 02/05/23 1625 02/05/23 1737        MEDICATIONS: Scheduled Meds:  (feeding supplement) PROSource Plus  30 mL Oral BID BM   amiodarone  200 mg Oral Daily   apixaban  5 mg Oral BID   atorvastatin  80 mg Oral Daily   calcitRIOL  0.75 mcg Oral Q T,Th,Sa-HD   carvedilol  6.25 mg Oral BID WC   Chlorhexidine Gluconate Cloth  6 each Topical Q0600   clonazePAM  0.5 mg Oral BID   ferric citrate  210 mg Oral TID WC   heparin sodium (porcine)  2,000 Units Intravenous Once   insulin aspart  0-6 Units Subcutaneous TID WC   insulin aspart  2 Units Subcutaneous TID WC   insulin glargine-yfgn  8 Units Subcutaneous Q24H   levothyroxine  75 mcg Oral Q0600   metoCLOPramide (REGLAN) injection  10 mg Intravenous TID AC   mirtazapine  7.5 mg Oral QHS   pantoprazole  40 mg Oral BID   polyethylene glycol  17 g Oral Daily   venlafaxine XR  75 mg Oral Q breakfast   Continuous Infusions:  fluconazole (DIFLUCAN) IV 200 mg (02/10/23 1633)    PRN Meds:.acetaminophen **OR** acetaminophen, bisacodyl, dextrose, methocarbamol, midodrine, trimethobenzamide   I have personally reviewed following labs and imaging studies  LABORATORY DATA: CBC: Recent Labs  Lab  02/05/23 1114 02/05/23 1351 02/06/23 0726 02/07/23 0810 02/10/23 1002 02/11/23 0409  WBC 6.9  --  6.6 5.8 6.1 6.2  NEUTROABS  --   --   --  4.2  --   --   HGB 11.7* 13.9 10.6* 10.8* 10.3* 11.2*  HCT 40.4 41.0 35.9* 35.0* 34.8* 37.7  MCV 77.7*  --  76.9* 77.3* 76.5* 77.1*  PLT 237  --  215 221 198 199    Basic Metabolic Panel: Recent Labs  Lab 02/06/23 0726 02/07/23 0417 02/08/23 1020 02/10/23 1003 02/11/23 0409  NA 131* 132* 133* 127* 133*  K 3.6 3.1* 3.7 3.3* 3.4*  CL 95* 95* 96* 93* 94*  CO2 21* 27 27  26 27  GLUCOSE 78 259* 171* 326* 157*  BUN 53* 20 16 35* 16  CREATININE 4.62* 2.90* 2.84* 5.07* 3.35*  CALCIUM 9.3 8.4* 9.2 8.2* 8.8*  MG 1.7 1.9 2.5*  --  1.9  PHOS 3.7 2.4* 4.0 4.9* 3.5    GFR: Estimated Creatinine Clearance: 15.2 mL/min (A) (by C-G formula based on SCr of 3.35 mg/dL (H)).  Liver Function Tests: Recent Labs  Lab 02/05/23 1114 02/06/23 0726 02/07/23 0417 02/08/23 1020 02/10/23 1003 02/11/23 0409  AST 21  --   --   --   --   --   ALT 21  --   --   --   --   --   ALKPHOS 222*  --   --   --   --   --   BILITOT 0.7  --   --   --   --   --   PROT 7.4  --   --   --   --   --   ALBUMIN 3.5 3.0* 2.5* 3.0* 2.6* 2.8*   Recent Labs  Lab 02/05/23 1114  LIPASE 23   No results for input(s): "AMMONIA" in the last 168 hours.  Coagulation Profile: No results for input(s): "INR", "PROTIME" in the last 168 hours.  Cardiac Enzymes: No results for input(s): "CKTOTAL", "CKMB", "CKMBINDEX", "TROPONINI" in the last 168 hours.  BNP (last 3 results) No results for input(s): "PROBNP" in the last 8760 hours.  Lipid Profile: No results for input(s): "CHOL", "HDL", "LDLCALC", "TRIG", "CHOLHDL", "LDLDIRECT" in the last 72 hours.  Thyroid Function Tests: No results for input(s): "TSH", "T4TOTAL", "FREET4", "T3FREE", "THYROIDAB" in the last 72 hours.  Anemia Panel: No results for input(s): "VITAMINB12", "FOLATE", "FERRITIN", "TIBC", "IRON", "RETICCTPCT" in  the last 72 hours.  Urine analysis:    Component Value Date/Time   COLORURINE AMBER (A) 02/05/2023 1418   APPEARANCEUR TURBID (A) 02/05/2023 1418   LABSPEC 1.015 02/05/2023 1418   PHURINE 6.0 02/05/2023 1418   GLUCOSEU >=500 (A) 02/05/2023 1418   HGBUR MODERATE (A) 02/05/2023 1418   BILIRUBINUR NEGATIVE 02/05/2023 1418   BILIRUBINUR negative 06/05/2020 1334   KETONESUR 5 (A) 02/05/2023 1418   PROTEINUR 100 (A) 02/05/2023 1418   UROBILINOGEN 0.2 06/05/2020 1334   UROBILINOGEN 0.2 04/01/2014 1659   NITRITE NEGATIVE 02/05/2023 1418   LEUKOCYTESUR LARGE (A) 02/05/2023 1418    Sepsis Labs: Lactic Acid, Venous    Component Value Date/Time   LATICACIDVEN 0.9 11/20/2022 0116    MICROBIOLOGY: No results found for this or any previous visit (from the past 240 hour(s)).  RADIOLOGY STUDIES/RESULTS: No results found.   LOS: 6 days   Jeoffrey Massed, MD  [facility]    To contact the attending provider between 7A-7P or the covering provider during after hours 7P-7A, please log into the web site [email or web address] and access using universal [place] password for that web site. If you do not have the password, please call the hospital operator.  02/11/2023, 11:56 AM

## 2023-02-11 NOTE — Plan of Care (Signed)

## 2023-02-11 NOTE — Progress Notes (Signed)
OT Cancellation Note  Patient Details Name: Susan [name] MRN: [id number] DOB: Jan 10, 1951   Cancelled Treatment:    Reason Eval/Treat Not Completed: Patient declined, no reason specified (Patient declined OT stating she felt "Queasy". OT to attempt again later today as schedule permits) Alfonse Flavors, OTA Acute Rehabilitation Services  Office [phone]  Dewain Penning 02/11/2023, 8:55 AM

## 2023-02-11 NOTE — Progress Notes (Signed)
Contacted attending to inquire about possible d/c date for pt in an attempt to avoid HD on day of d/c. Pt's d/c date is dependant upon pt being able to tolerate oral intake per attending. Will assist as needed.   Olivia Canter Renal Navigator [phone]

## 2023-02-11 NOTE — TOC Progression Note (Signed)
Transition of Care [facility]) - Progression Note    Patient Details  Name: Susan [name] MRN: [id number] Date of Birth: 09-18-1950  Transition of Care [facility]) CM/SW Contact  Gordy Clement, RN Phone Number: 02/11/2023, 9:37 AM  Clinical Narrative:     Chart review- Continuing to follow for any additional DC needs Will resume w/[facility] at DC  SN/PT/OT /CSW/AIDE . OPHD in [place] . Will continue to follow for any TOC needs       Barriers to Discharge: Continued Medical Work up  Expected Discharge Plan and Services   Discharge Planning Services: CM Consult   Living arrangements for the past 2 months: Single Family Home                                       Social Determinants of Health (SDOH) Interventions SDOH Screenings   Food Insecurity: No Food Insecurity (02/11/2023)  Housing: Low Risk  (02/11/2023)  Transportation Needs: No Transportation Needs (02/11/2023)  Utilities: Not At Risk (02/11/2023)  Alcohol Screen: Low Risk  (08/16/2021)  Depression (PHQ2-9): Low Risk  (09/29/2022)  Financial Resource Strain: Low Risk  (08/16/2021)  Physical Activity: Inactive (08/16/2021)  Social Connections: Moderately Isolated (08/16/2021)  Stress: No Stress Concern Present (08/16/2021)  Tobacco Use: Medium Risk (02/05/2023)    Readmission Risk Interventions    01/21/2023    3:09 PM 09/04/2022    5:11 PM 05/12/2022    5:04 PM  Readmission Risk Prevention Plan  Transportation Screening Complete Complete Complete  Medication Review (RN Care Manager) Referral to Pharmacy Complete   PCP or Specialist appointment within 3-5 days of discharge Complete Complete   HRI or Home Care Consult Complete Complete   SW Recovery Care/Counseling Consult Complete Complete Complete  Palliative Care Screening Not Applicable Not Applicable   Skilled Nursing Facility Not Applicable Complete Complete

## 2023-02-11 NOTE — H&P (View-Only) (Signed)
PROGRESS NOTE        PATIENT DETAILS Name: Susan [name] Age: 72 y.o. Sex: female Date of Birth: 1950-07-01 Admit Date: 02/05/2023 Admitting Physician Alan Mulder, MD [id number], Priscille Heidelberg, MD  Brief Summary: Patient is a 72 y.o.  female with history of ESRD on HD TTS, DM-2, PAF, CAD, HFrEF, HTN, PAD s/p right BKA-presented with nausea/vomiting in the setting of hyperosmolar hyperglycemic state.  Significant events: 9/12>> admit to [facility]  Significant studies: 9/14>> CT abdomen: No SBO  Significant microbiology data: None  Procedures: None  Consults: Nephrology  Subjective: No vomiting overnight  Objective: Vitals: Blood pressure (!) 111/36, pulse 69, temperature 98.3 F (36.8 C), temperature source Oral, resp. rate 12, height 5\' 5"  (1.651 m), weight 74.9 kg, SpO2 100%.   Exam: Gen Exam:Alert awake-not in any distress HEENT:atraumatic, normocephalic Chest: B/L clear to auscultation anteriorly CVS:S1S2 regular Abdomen:soft non tender, non distended Extremities: Right BKA Neurology: Non focal Skin: no rash  Pertinent Labs/Radiology:    Latest Ref Rng & Units 02/11/2023    4:09 AM 02/10/2023   10:02 AM 02/07/2023    8:10 AM  CBC  WBC 4.0 - 10.5 K/uL 6.2  6.1  5.8   Hemoglobin 12.0 - 15.0 g/dL 56.2  13.0  86.5   Hematocrit 36.0 - 46.0 % 37.7  34.8  35.0   Platelets 150 - 400 K/uL 199  198  221     Lab Results  Component Value Date   NA 133 (L) 02/11/2023   K 3.4 (L) 02/11/2023   CL 94 (L) 02/11/2023   CO2 27 02/11/2023      Assessment/Plan: Hyperosmolar hyperglycemic state Required insulin infusion-has been transitioned to SQ insulin over night  DM-2 with uncontrolled hyperglycemia (A1c 7.9 on 4/3) CBG stable-Semglee 12 units daily + SSI   Recent Labs    02/10/23 2058 02/11/23 0758 02/11/23 1152  GLUCAP 184* 119* 141*     Nausea/vomiting likely due to gastroparesis flare CT abdomen on 9/14 negative for  SBO Continues to have intermittent nausea and some vomiting-last episode of vomiting on 9/16 Discussed with primary gastroenterologist Dr. Azzie Glatter prior EGD-had significant esophageal candidiasis-recommends retreating with IV Diflucan empirically IV Reglan x 3 doses-has prolonged QTc-will need to be cautious Small portion meals Advance to regular diet  Asymptomatic bacteriuria UA dirty but no symptoms-makes very little urine No longer on Cefepime-UTI ruled out.  Chronic HFrEF Volume removal with HD  CAD No anginal symptoms Blocker/statin Suspect not on ASA due to being on Eliquis.  PAF Telemetry monitoring Coreg/amiodarone Eliquis  ESRD on HD TTS Nephrology following  Hypokalemia Replete/recheck  Hyponatremia Due to excess volume-should improve with HD Follow electrolytes periodically  Normocytic anemia Secondary to CKD Follow CBC and transfuse if significant drop  PAD-s/p right BKA  Depression/anxiety Relatively stable Effexor/Remeron/Klonopin  Hypothyroidism Synthroid TSH on 8/22 stable  Debility/deconditioning PT/OT eval-Home health recommended  Chronic venous ulcer-left lateral/posterior leg Present prior to admission Follows with Dr. Lajoyce Corners Continue dressing changes per wound care team.  BMI: Estimated body mass index is 27.48 kg/m as calculated from the following:   Height as of this encounter: 5\' 5"  (1.651 m).   Weight as of this encounter: 74.9 kg.   Code status:   Code Status: Full Code   DVT Prophylaxis: SCDs Start: 02/05/23 1719 apixaban (ELIQUIS) tablet 5 mg    Family  Communication: None at bedside   Disposition Plan: Status is: Inpatient Remains inpatient appropriate because:    Planned Discharge Destination:Home   Diet: Diet Order             Diet heart healthy/carb modified Fluid consistency: Thin; Fluid restriction: 1200 mL Fluid  Diet effective now                     Antimicrobial  agents: Anti-infectives (From admission, onward)    Start     Dose/Rate Route Frequency Ordered Stop   02/10/23 1100  fluconazole (DIFLUCAN) IVPB 200 mg        200 mg 100 mL/hr over 60 Minutes Intravenous Every 24 hours 02/10/23 1045     02/06/23 1600  ceFEPIme (MAXIPIME) 1 g in sodium chloride 0.9 % 100 mL IVPB  Status:  Discontinued        1 g 200 mL/hr over 30 Minutes Intravenous Every 24 hours 02/05/23 2044 02/06/23 0935   02/05/23 2130  ceFEPIme (MAXIPIME) 1 g in sodium chloride 0.9 % 100 mL IVPB  Status:  Discontinued        1 g 200 mL/hr over 30 Minutes Intravenous Every 24 hours 02/05/23 2041 02/05/23 2044   02/05/23 1630  ceFEPIme (MAXIPIME) 2 g in sodium chloride 0.9 % 100 mL IVPB        2 g 200 mL/hr over 30 Minutes Intravenous  Once 02/05/23 1625 02/05/23 1737        MEDICATIONS: Scheduled Meds:  (feeding supplement) PROSource Plus  30 mL Oral BID BM   amiodarone  200 mg Oral Daily   apixaban  5 mg Oral BID   atorvastatin  80 mg Oral Daily   calcitRIOL  0.75 mcg Oral Q T,Th,Sa-HD   carvedilol  6.25 mg Oral BID WC   Chlorhexidine Gluconate Cloth  6 each Topical Q0600   clonazePAM  0.5 mg Oral BID   ferric citrate  210 mg Oral TID WC   heparin sodium (porcine)  2,000 Units Intravenous Once   insulin aspart  0-6 Units Subcutaneous TID WC   insulin aspart  2 Units Subcutaneous TID WC   insulin glargine-yfgn  8 Units Subcutaneous Q24H   levothyroxine  75 mcg Oral Q0600   metoCLOPramide (REGLAN) injection  10 mg Intravenous TID AC   mirtazapine  7.5 mg Oral QHS   pantoprazole  40 mg Oral BID   polyethylene glycol  17 g Oral Daily   venlafaxine XR  75 mg Oral Q breakfast   Continuous Infusions:  fluconazole (DIFLUCAN) IV 200 mg (02/10/23 1633)    PRN Meds:.acetaminophen **OR** acetaminophen, bisacodyl, dextrose, methocarbamol, midodrine, trimethobenzamide   I have personally reviewed following labs and imaging studies  LABORATORY DATA: CBC: Recent Labs  Lab  02/05/23 1114 02/05/23 1351 02/06/23 0726 02/07/23 0810 02/10/23 1002 02/11/23 0409  WBC 6.9  --  6.6 5.8 6.1 6.2  NEUTROABS  --   --   --  4.2  --   --   HGB 11.7* 13.9 10.6* 10.8* 10.3* 11.2*  HCT 40.4 41.0 35.9* 35.0* 34.8* 37.7  MCV 77.7*  --  76.9* 77.3* 76.5* 77.1*  PLT 237  --  215 221 198 199    Basic Metabolic Panel: Recent Labs  Lab 02/06/23 0726 02/07/23 0417 02/08/23 1020 02/10/23 1003 02/11/23 0409  NA 131* 132* 133* 127* 133*  K 3.6 3.1* 3.7 3.3* 3.4*  CL 95* 95* 96* 93* 94*  CO2 21* 27 27  26 27  GLUCOSE 78 259* 171* 326* 157*  BUN 53* 20 16 35* 16  CREATININE 4.62* 2.90* 2.84* 5.07* 3.35*  CALCIUM 9.3 8.4* 9.2 8.2* 8.8*  MG 1.7 1.9 2.5*  --  1.9  PHOS 3.7 2.4* 4.0 4.9* 3.5    GFR: Estimated Creatinine Clearance: 15.2 mL/min (A) (by C-G formula based on SCr of 3.35 mg/dL (H)).  Liver Function Tests: Recent Labs  Lab 02/05/23 1114 02/06/23 0726 02/07/23 0417 02/08/23 1020 02/10/23 1003 02/11/23 0409  AST 21  --   --   --   --   --   ALT 21  --   --   --   --   --   ALKPHOS 222*  --   --   --   --   --   BILITOT 0.7  --   --   --   --   --   PROT 7.4  --   --   --   --   --   ALBUMIN 3.5 3.0* 2.5* 3.0* 2.6* 2.8*   Recent Labs  Lab 02/05/23 1114  LIPASE 23   No results for input(s): "AMMONIA" in the last 168 hours.  Coagulation Profile: No results for input(s): "INR", "PROTIME" in the last 168 hours.  Cardiac Enzymes: No results for input(s): "CKTOTAL", "CKMB", "CKMBINDEX", "TROPONINI" in the last 168 hours.  BNP (last 3 results) No results for input(s): "PROBNP" in the last 8760 hours.  Lipid Profile: No results for input(s): "CHOL", "HDL", "LDLCALC", "TRIG", "CHOLHDL", "LDLDIRECT" in the last 72 hours.  Thyroid Function Tests: No results for input(s): "TSH", "T4TOTAL", "FREET4", "T3FREE", "THYROIDAB" in the last 72 hours.  Anemia Panel: No results for input(s): "VITAMINB12", "FOLATE", "FERRITIN", "TIBC", "IRON", "RETICCTPCT" in  the last 72 hours.  Urine analysis:    Component Value Date/Time   COLORURINE AMBER (A) 02/05/2023 1418   APPEARANCEUR TURBID (A) 02/05/2023 1418   LABSPEC 1.015 02/05/2023 1418   PHURINE 6.0 02/05/2023 1418   GLUCOSEU >=500 (A) 02/05/2023 1418   HGBUR MODERATE (A) 02/05/2023 1418   BILIRUBINUR NEGATIVE 02/05/2023 1418   BILIRUBINUR negative 06/05/2020 1334   KETONESUR 5 (A) 02/05/2023 1418   PROTEINUR 100 (A) 02/05/2023 1418   UROBILINOGEN 0.2 06/05/2020 1334   UROBILINOGEN 0.2 04/01/2014 1659   NITRITE NEGATIVE 02/05/2023 1418   LEUKOCYTESUR LARGE (A) 02/05/2023 1418    Sepsis Labs: Lactic Acid, Venous    Component Value Date/Time   LATICACIDVEN 0.9 11/20/2022 0116    MICROBIOLOGY: No results found for this or any previous visit (from the past 240 hour(s)).  RADIOLOGY STUDIES/RESULTS: No results found.   LOS: 6 days   Jeoffrey Massed, MD  [facility]    To contact the attending provider between 7A-7P or the covering provider during after hours 7P-7A, please log into the web site [email or web address] and access using universal [place] password for that web site. If you do not have the password, please call the hospital operator.  02/11/2023, 11:56 AM

## 2023-02-11 NOTE — Progress Notes (Signed)
[facility] Progress Note   Subjective:  Seen in room. No new events. Still nauseated.   Objective Vitals:   02/10/23 2000 02/10/23 2317 02/11/23 0400 02/11/23 0800  BP: (!) 113/47 (!) 117/45 (!) 123/46 (!) 111/36  Pulse: 70 74 73 69  Resp: 15 15 16 12   Temp: 98 F (36.7 C) 98.1 F (36.7 C) 98 F (36.7 C) 98.3 F (36.8 C)  TempSrc: Oral Oral Oral Oral  SpO2: 99% 99% 100%   Weight:      Height:       Physical Exam General: Chronically ill appearing woman, NAD. Nasal O2 in place. Heart: RRR; no murmur Lungs: CTA anteriorly Abdomen: soft, non-tender Extremities: R BKA, 1+ LLE edema Dialysis Access: [facility]  Additional Objective Labs: Basic Metabolic Panel: Recent Labs  Lab 02/08/23 1020 02/10/23 1003 02/11/23 0409  NA 133* 127* 133*  K 3.7 3.3* 3.4*  CL 96* 93* 94*  CO2 27 26 27   GLUCOSE 171* 326* 157*  BUN 16 35* 16  CREATININE 2.84* 5.07* 3.35*  CALCIUM 9.2 8.2* 8.8*  PHOS 4.0 4.9* 3.5   Liver Function Tests: Recent Labs  Lab 02/05/23 1114 02/06/23 0726 02/08/23 1020 02/10/23 1003 02/11/23 0409  AST 21  --   --   --   --   ALT 21  --   --   --   --   ALKPHOS 222*  --   --   --   --   BILITOT 0.7  --   --   --   --   PROT 7.4  --   --   --   --   ALBUMIN 3.5   < > 3.0* 2.6* 2.8*   < > = values in this interval not displayed.   Recent Labs  Lab 02/05/23 1114  LIPASE 23   CBC: Recent Labs  Lab 02/05/23 1114 02/05/23 1351 02/06/23 0726 02/07/23 0810 02/10/23 1002 02/11/23 0409  WBC 6.9  --  6.6 5.8 6.1 6.2  NEUTROABS  --   --   --  4.2  --   --   HGB 11.7*   < > 10.6* 10.8* 10.3* 11.2*  HCT 40.4   < > 35.9* 35.0* 34.8* 37.7  MCV 77.7*  --  76.9* 77.3* 76.5* 77.1*  PLT 237  --  215 221 198 199   < > = values in this interval not displayed.   Studies/Results: No results found.  Medications:  fluconazole (DIFLUCAN) IV 200 mg (02/10/23 1633)    (feeding supplement) PROSource Plus  30 mL Oral BID BM   amiodarone  200 mg  Oral Daily   apixaban  5 mg Oral BID   atorvastatin  80 mg Oral Daily   calcitRIOL  0.75 mcg Oral Q T,Th,Sa-HD   carvedilol  6.25 mg Oral BID WC   Chlorhexidine Gluconate Cloth  6 each Topical Q0600   clonazePAM  0.5 mg Oral BID   ferric citrate  210 mg Oral TID WC   heparin sodium (porcine)  2,000 Units Intravenous Once   insulin aspart  0-6 Units Subcutaneous TID WC   insulin aspart  2 Units Subcutaneous TID WC   insulin glargine-yfgn  8 Units Subcutaneous Q24H   levothyroxine  75 mcg Oral Q0600   metoCLOPramide (REGLAN) injection  10 mg Intravenous TID AC   mirtazapine  7.5 mg Oral QHS   pantoprazole  40 mg Oral BID   polyethylene glycol  17 g Oral Daily  venlafaxine XR  75 mg Oral Q breakfast    Dialysis Orders: TTS [facility] 4hr, 400/800, EDW 79kg, 2K/2Ca bath, TDC, Heparin 4000 + 2000 midrun - Aranesp given 9/3 during recent admit - Calcitriol 0.87mcg PO q HD - Binder recently changed from renvela to [place] Just discharged 02/02/2023   Assessment/Plan: 1. Intractable nausea/vomiting: Just discharged with same, readmitted 9/12. ?Felt due to colitis and/or gastroparesis, uncontrolled BS on admit - improved. S/p 3 doses of Reglan  -advancing to liquid diet  2. ESRD: Usual TTS schedule - back on usual schedule now - Next HD 9/19 3. HTN/volume: BP variable, does have edema on exam as well as hypoNa. UF as tolerated. 4. Anemia: Hgb 10-11 - follow without ESA for now. 5. Secondary hyperparathyroidism: CorrCa ok, Phos low. Reducing Auryxia to 1/meals. 6. Nutrition: Alb low, adding supps. 7. T2DM: BS > 600 on admit - better with insulin. 8. CAD 9. pAF: On Eliquis    Madelon Welsch Susann Givens PA-C [facility] 02/11/2023,11:53 AM

## 2023-02-12 ENCOUNTER — Encounter (HOSPITAL_COMMUNITY): Payer: Self-pay | Admitting: Internal Medicine

## 2023-02-12 ENCOUNTER — Encounter (HOSPITAL_COMMUNITY)
Admission: EM | Disposition: A | Discharge status: Home / Self Care | Payer: Self-pay | Source: Home / Self Care | Attending: Internal Medicine

## 2023-02-12 ENCOUNTER — Inpatient Hospital Stay (HOSPITAL_COMMUNITY): Payer: PPO | Admitting: Anesthesiology

## 2023-02-12 DIAGNOSIS — I5020 Unspecified systolic (congestive) heart failure: Secondary | ICD-10-CM | POA: Diagnosis not present

## 2023-02-12 DIAGNOSIS — N186 End stage renal disease: Secondary | ICD-10-CM | POA: Diagnosis not present

## 2023-02-12 DIAGNOSIS — I480 Paroxysmal atrial fibrillation: Secondary | ICD-10-CM | POA: Diagnosis not present

## 2023-02-12 DIAGNOSIS — I5022 Chronic systolic (congestive) heart failure: Secondary | ICD-10-CM

## 2023-02-12 DIAGNOSIS — R112 Nausea with vomiting, unspecified: Secondary | ICD-10-CM

## 2023-02-12 DIAGNOSIS — I132 Hypertensive heart and chronic kidney disease with heart failure and with stage 5 chronic kidney disease, or end stage renal disease: Secondary | ICD-10-CM

## 2023-02-12 DIAGNOSIS — E1100 Type 2 diabetes mellitus with hyperosmolarity without nonketotic hyperglycemic-hyperosmolar coma (NKHHC): Secondary | ICD-10-CM | POA: Diagnosis not present

## 2023-02-12 HISTORY — PX: ESOPHAGOGASTRODUODENOSCOPY: SHX5428

## 2023-02-12 LAB — RENAL FUNCTION PANEL
Albumin: 2.9 g/dL — ABNORMAL LOW (ref 3.5–5.0)
Anion gap: 16 — ABNORMAL HIGH (ref 5–15)
BUN: 25 mg/dL — ABNORMAL HIGH (ref 8–23)
CO2: 23 mmol/L (ref 22–32)
Calcium: 9 mg/dL (ref 8.9–10.3)
Chloride: 95 mmol/L — ABNORMAL LOW (ref 98–111)
Creatinine, Ser: 4.68 mg/dL — ABNORMAL HIGH (ref 0.44–1.00)
GFR, Estimated: 9 mL/min — ABNORMAL LOW (ref >60)
Glucose, Bld: 150 mg/dL — ABNORMAL HIGH (ref 70–99)
Phosphorus: 4.2 mg/dL (ref 2.5–4.6)
Potassium: 3.9 mmol/L (ref 3.5–5.1)
Sodium: 134 mmol/L — ABNORMAL LOW (ref 135–145)

## 2023-02-12 LAB — GLUCOSE, CAPILLARY
Glucose-Capillary: 107 mg/dL — ABNORMAL HIGH (ref 70–99)
Glucose-Capillary: 318 mg/dL — ABNORMAL HIGH (ref 70–99)

## 2023-02-12 LAB — MAGNESIUM: Magnesium: 2.3 mg/dL (ref 1.7–2.4)

## 2023-02-12 LAB — CBC
HCT: 38.3 % (ref 36.0–46.0)
Hemoglobin: 11.3 g/dL — ABNORMAL LOW (ref 12.0–15.0)
MCH: 23 pg — ABNORMAL LOW (ref 26.0–34.0)
MCHC: 29.5 g/dL — ABNORMAL LOW (ref 30.0–36.0)
MCV: 77.8 fL — ABNORMAL LOW (ref 80.0–100.0)
Platelets: 211 10*3/uL (ref 150–400)
RBC: 4.92 MIL/uL (ref 3.87–5.11)
RDW: 20.1 % — ABNORMAL HIGH (ref 11.5–15.5)
WBC: 6.4 10*3/uL (ref 4.0–10.5)
nRBC: 0 % (ref 0.0–0.2)

## 2023-02-12 SURGERY — EGD (ESOPHAGOGASTRODUODENOSCOPY)
Anesthesia: Monitor Anesthesia Care

## 2023-02-12 MED ORDER — METOCLOPRAMIDE HCL 5 MG/ML IJ SOLN
5.0000 mg | Freq: Three times a day (TID) | INTRAMUSCULAR | Status: AC
  Administered 2023-02-12 – 2023-02-13 (×2): 5 mg via INTRAVENOUS
  Filled 2023-02-12 (×2): qty 2

## 2023-02-12 MED ORDER — PROPOFOL 10 MG/ML IV BOLUS
INTRAVENOUS | Status: DC | PRN
  Administered 2023-02-12: 30 mg via INTRAVENOUS
  Administered 2023-02-12: 20 mg via INTRAVENOUS
  Administered 2023-02-12: 70 mg via INTRAVENOUS
  Administered 2023-02-12: 20 mg via INTRAVENOUS

## 2023-02-12 MED ORDER — PHENYLEPHRINE HCL (PRESSORS) 10 MG/ML IV SOLN
INTRAVENOUS | Status: DC | PRN
Start: 2023-02-12 — End: 2023-02-12
  Administered 2023-02-12 (×2): 100 ug via INTRAVENOUS

## 2023-02-12 MED ORDER — CHOLESTYRAMINE 4 G PO PACK
4.0000 g | PACK | Freq: Three times a day (TID) | ORAL | Status: DC
  Administered 2023-02-13 – 2023-02-17 (×12): 4 g via ORAL
  Filled 2023-02-12 (×16): qty 1

## 2023-02-12 MED ORDER — LIDOCAINE 2% (20 MG/ML) 5 ML SYRINGE
INTRAMUSCULAR | Status: DC | PRN
  Administered 2023-02-12: 50 mg via INTRAVENOUS

## 2023-02-12 MED ORDER — SODIUM CHLORIDE 0.9 % IV SOLN: INTRAVENOUS | Status: DC

## 2023-02-12 MED ORDER — APIXABAN 5 MG PO TABS
5.0000 mg | ORAL_TABLET | Freq: Two times a day (BID) | ORAL | Status: DC
  Administered 2023-02-13 – 2023-02-17 (×9): 5 mg via ORAL
  Filled 2023-02-12 (×9): qty 1

## 2023-02-12 NOTE — Progress Notes (Signed)
PROGRESS NOTE        PATIENT DETAILS Name: Susan [name] Age: 72 y.o. Sex: female Date of Birth: [birth year] Admit Date: 02/05/2023 Admitting Physician Alan Mulder, MD ONG:[id number], Priscille Heidelberg, MD  Brief Summary: Patient is a 72 y.o.  female with history of ESRD on HD TTS, DM-2, PAF, CAD, HFrEF, HTN, PAD s/p right BKA-presented with nausea/vomiting in the setting of hyperosmolar hyperglycemic state.  Significant events: 9/12>> admit to [facility]  Significant studies: 9/14>> CT abdomen: No SBO  Significant microbiology data: None  Procedures: None  Consults: Nephrology  Subjective: Had numerous episodes of vomiting yesterday and through the night.  Downgraded to clear liquids-kept n.p.o. this morning.  Had BM yesterday.  No abdominal pain.  Objective: Vitals: Blood pressure (!) 146/59, pulse 74, temperature 97.9 F (36.6 C), temperature source Oral, resp. rate 14, height 5\' 5"  (1.651 m), weight 80 kg, SpO2 100%.   Exam: Gen Exam:Alert awake-not in any distress HEENT:atraumatic, normocephalic Chest: B/L clear to auscultation anteriorly CVS:S1S2 regular Abdomen:soft non tender, non distended Extremities:no edema -right BKA. Neurology: Non focal Skin: no rash  Pertinent Labs/Radiology:    Latest Ref Rng & Units 02/12/2023    6:52 AM 02/11/2023    4:09 AM 02/10/2023   10:02 AM  CBC  WBC 4.0 - 10.5 K/uL 6.4  6.2  6.1   Hemoglobin 12.0 - 15.0 g/dL 32.4  40.1  02.7   Hematocrit 36.0 - 46.0 % 38.3  37.7  34.8   Platelets 150 - 400 K/uL 211  199  198     Lab Results  Component Value Date   NA 134 (L) 02/12/2023   K 3.9 02/12/2023   CL 95 (L) 02/12/2023   CO2 23 02/12/2023      Assessment/Plan: Hyperosmolar hyperglycemic state Required insulin infusion-has been transitioned to SQ insulin over night  DM-2 with uncontrolled hyperglycemia (A1c 7.9 on 4/3) CBG stable-Semglee 12 units daily + SSI   Recent Labs    02/11/23 1152  02/11/23 1800 02/11/23 2017  GLUCAP 141* 164* 161*     Nausea/vomiting likely due to gastroparesis flare CT abdomen on 9/14 negative for SBO Unfortunately has had numerous recurrent issues with nausea and vomiting in spite of being on antiemetics and scheduled Reglan for several days. Had discussed with Dr. Elnoria Howard on 9/17-prior EGD in April that showed significant candidiasis-and hence placed on IV Diflucan Will keep n.p.o. today-hold Eliquis-and formally consult gastroenterology Continue scheduled IV Reglan Has prolonged QTc-will need to be watched closely on telemetry.  Asymptomatic bacteriuria UA dirty but no symptoms-makes very little urine No longer on Cefepime-UTI ruled out.  Chronic HFrEF Volume removal with HD  CAD No anginal symptoms Blocker/statin Suspect not on ASA due to being on Eliquis.  PAF Telemetry monitoring Coreg/amiodarone Holding Eliquis in case patient requires EGD-see above  ESRD on HD TTS Nephrology following  Hypokalemia Repleted  Hyponatremia Due to excess volume Mild-asymptomatic Should improved with HD.  Normocytic anemia Secondary to CKD Follow CBC and transfuse if significant drop  PAD-s/p right BKA  Depression/anxiety Relatively stable Effexor/Remeron/Klonopin  Hypothyroidism Synthroid TSH on 8/22 stable  Debility/deconditioning PT/OT eval-Home health recommended  Chronic venous ulcer-left lateral/posterior leg Present prior to admission Follows with Dr. Lajoyce Corners Continue dressing changes per wound care team.  BMI: Estimated body mass index is 29.35 kg/m as calculated from the following:  Height as of this encounter: 5\' 5"  (1.651 m).   Weight as of this encounter: 80 kg.   Code status:   Code Status: Full Code   DVT Prophylaxis: SCDs Start: 02/05/23 1719    Family Communication: None at bedside   Disposition Plan: Status is: Inpatient Remains inpatient appropriate because:    Planned Discharge  Destination:Home   Diet: Diet Order             Diet NPO time specified  Diet effective now                     Antimicrobial agents: Anti-infectives (From admission, onward)    Start     Dose/Rate Route Frequency Ordered Stop   02/10/23 1100  fluconazole (DIFLUCAN) IVPB 200 mg        200 mg 100 mL/hr over 60 Minutes Intravenous Every 24 hours 02/10/23 1045     02/06/23 1600  ceFEPIme (MAXIPIME) 1 g in sodium chloride 0.9 % 100 mL IVPB  Status:  Discontinued        1 g 200 mL/hr over 30 Minutes Intravenous Every 24 hours 02/05/23 2044 02/06/23 0935   02/05/23 2130  ceFEPIme (MAXIPIME) 1 g in sodium chloride 0.9 % 100 mL IVPB  Status:  Discontinued        1 g 200 mL/hr over 30 Minutes Intravenous Every 24 hours 02/05/23 2041 02/05/23 2044   02/05/23 1630  ceFEPIme (MAXIPIME) 2 g in sodium chloride 0.9 % 100 mL IVPB        2 g 200 mL/hr over 30 Minutes Intravenous  Once 02/05/23 1625 02/05/23 1737        MEDICATIONS: Scheduled Meds:  (feeding supplement) PROSource Plus  30 mL Oral BID BM   amiodarone  200 mg Oral Daily   atorvastatin  80 mg Oral Daily   calcitRIOL  0.75 mcg Oral Q T,Th,Sa-HD   carvedilol  6.25 mg Oral BID WC   Chlorhexidine Gluconate Cloth  6 each Topical Q0600   clonazePAM  0.5 mg Oral BID   ferric citrate  210 mg Oral TID WC   insulin aspart  0-6 Units Subcutaneous TID WC   insulin aspart  2 Units Subcutaneous TID WC   insulin glargine-yfgn  8 Units Subcutaneous Q24H   levothyroxine  75 mcg Oral Q0600   metoCLOPramide (REGLAN) injection  5 mg Intravenous TID AC   mirtazapine  7.5 mg Oral QHS   pantoprazole  40 mg Oral BID   polyethylene glycol  17 g Oral Daily   venlafaxine XR  75 mg Oral Q breakfast   Continuous Infusions:  fluconazole (DIFLUCAN) IV Stopped (02/11/23 1315)    PRN Meds:.acetaminophen **OR** acetaminophen, bisacodyl, dextrose, methocarbamol, midodrine, trimethobenzamide   I have personally reviewed following labs and  imaging studies  LABORATORY DATA: CBC: Recent Labs  Lab 02/06/23 0726 02/07/23 0810 02/10/23 1002 02/11/23 0409 02/12/23 0652  WBC 6.6 5.8 6.1 6.2 6.4  NEUTROABS  --  4.2  --   --   --   HGB 10.6* 10.8* 10.3* 11.2* 11.3*  HCT 35.9* 35.0* 34.8* 37.7 38.3  MCV 76.9* 77.3* 76.5* 77.1* 77.8*  PLT 215 221 198 199 211    Basic Metabolic Panel: Recent Labs  Lab 02/06/23 0726 02/07/23 0417 02/08/23 1020 02/10/23 1003 02/11/23 0409 02/12/23 0648 02/12/23 0652  NA 131* 132* 133* 127* 133* 134*  --   K 3.6 3.1* 3.7 3.3* 3.4* 3.9  --   CL 95*  95* 96* 93* 94* 95*  --   CO2 21* 27 27 26 27 23   --   GLUCOSE 78 259* 171* 326* 157* 150*  --   BUN 53* 20 16 35* 16 25*  --   CREATININE 4.62* 2.90* 2.84* 5.07* 3.35* 4.68*  --   CALCIUM 9.3 8.4* 9.2 8.2* 8.8* 9.0  --   MG 1.7 1.9 2.5*  --  1.9  --  2.3  PHOS 3.7 2.4* 4.0 4.9* 3.5 4.2  --     GFR: Estimated Creatinine Clearance: 11.2 mL/min (A) (by C-G formula based on SCr of 4.68 mg/dL (H)).  Liver Function Tests: Recent Labs  Lab 02/05/23 1114 02/06/23 0726 02/07/23 0417 02/08/23 1020 02/10/23 1003 02/11/23 0409 02/12/23 0648  AST 21  --   --   --   --   --   --   ALT 21  --   --   --   --   --   --   ALKPHOS 222*  --   --   --   --   --   --   BILITOT 0.7  --   --   --   --   --   --   PROT 7.4  --   --   --   --   --   --   ALBUMIN 3.5   < > 2.5* 3.0* 2.6* 2.8* 2.9*   < > = values in this interval not displayed.   Recent Labs  Lab 02/05/23 1114  LIPASE 23   No results for input(s): "AMMONIA" in the last 168 hours.  Coagulation Profile: No results for input(s): "INR", "PROTIME" in the last 168 hours.  Cardiac Enzymes: No results for input(s): "CKTOTAL", "CKMB", "CKMBINDEX", "TROPONINI" in the last 168 hours.  BNP (last 3 results) No results for input(s): "PROBNP" in the last 8760 hours.  Lipid Profile: No results for input(s): "CHOL", "HDL", "LDLCALC", "TRIG", "CHOLHDL", "LDLDIRECT" in the last 72  hours.  Thyroid Function Tests: No results for input(s): "TSH", "T4TOTAL", "FREET4", "T3FREE", "THYROIDAB" in the last 72 hours.  Anemia Panel: No results for input(s): "VITAMINB12", "FOLATE", "FERRITIN", "TIBC", "IRON", "RETICCTPCT" in the last 72 hours.  Urine analysis:    Component Value Date/Time   COLORURINE AMBER (A) 02/05/2023 1418   APPEARANCEUR TURBID (A) 02/05/2023 1418   LABSPEC 1.015 02/05/2023 1418   PHURINE 6.0 02/05/2023 1418   GLUCOSEU >=500 (A) 02/05/2023 1418   HGBUR MODERATE (A) 02/05/2023 1418   BILIRUBINUR NEGATIVE 02/05/2023 1418   BILIRUBINUR negative 06/05/2020 1334   KETONESUR 5 (A) 02/05/2023 1418   PROTEINUR 100 (A) 02/05/2023 1418   UROBILINOGEN 0.2 06/05/2020 1334   UROBILINOGEN 0.2 04/01/2014 1659   NITRITE NEGATIVE 02/05/2023 1418   LEUKOCYTESUR LARGE (A) 02/05/2023 1418    Sepsis Labs: Lactic Acid, Venous    Component Value Date/Time   LATICACIDVEN 0.9 11/20/2022 0116    MICROBIOLOGY: No results found for this or any previous visit (from the past 240 hour(s)).  RADIOLOGY STUDIES/RESULTS: No results found.   LOS: 7 days   Jeoffrey Massed, MD  [facility]    To contact the attending provider between 7A-7P or the covering provider during after hours 7P-7A, please log into the web site [email or web address] and access using universal [employer] password for that web site. If you do not have the password, please call the hospital operator.  02/12/2023, 9:40 AM

## 2023-02-12 NOTE — Plan of Care (Signed)

## 2023-02-12 NOTE — Op Note (Signed)
[facility] Patient Name: Susan [name] Procedure Date : 02/12/2023 MRN: [id number] Attending MD: Jeani Hawking , MD, [id number] Date of Birth: [birth year] CSN: [id number] Age: 72 Admit Type: Inpatient Procedure:                Upper GI endoscopy Indications:              Nausea with vomiting Providers:                Jeani Hawking, MD, Stephens Shire RN, RN, Salley Scarlet, Technician Referring MD:              Medicines:                Propofol per Anesthesia Complications:            No immediate complications. Estimated Blood Loss:     Estimated blood loss: none. Procedure:                Pre-Anesthesia Assessment:                           - Prior to the procedure, a History and Physical                            was performed, and patient medications and                            allergies were reviewed. The patient's tolerance of                            previous anesthesia was also reviewed. The risks                            and benefits of the procedure and the sedation                            options and risks were discussed with the patient.                            All questions were answered, and informed consent                            was obtained. Prior Anticoagulants: The patient has                            taken no anticoagulant or antiplatelet agents. ASA                            Grade Assessment: III - A patient with severe                            systemic disease. After reviewing the risks and  benefits, the patient was deemed in satisfactory                            condition to undergo the procedure.                           - The heart rate, respiratory rate, oxygen                            saturations, blood pressure, adequacy of pulmonary                            ventilation, and response to care were monitored                            throughout the procedure.                            After obtaining informed consent, the endoscope was                            passed under direct vision. Throughout the                            procedure, the patient's blood pressure, pulse, and                            oxygen saturations were monitored continuously. The                            [id number]-[id number] ([id number]) Olympus endoscope was introduced                            through the mouth, and advanced to the second part                            of duodenum. The upper GI endoscopy was                            accomplished without difficulty. The patient                            tolerated the procedure well. Scope In: Scope Out: Findings:      The esophagus was normal.      Bilious fluid was found in the gastric body and in the gastric antrum.      The examined duodenum was normal.      There was a significant amount of bile in the gastric lumen. She may be       experiencing bile acid reflux. Impression:               - Normal esophagus.                           - Bilious gastric fluid.                           -  Normal examined duodenum.                           - No specimens collected. Recommendation:           - Return patient to hospital ward for ongoing care.                           - Resume regular diet.                           - Continue present medications.                           - Start cholestyramine.                           - If there is no benefit, a cardiology consultation                            may be required to discern if there is a component                            of CHF causing/contributing to the nausea and                            vomiting. Procedure Code(s):        --- Professional ---                           [phone], Esophagogastroduodenoscopy, flexible,                            transoral; diagnostic, including collection of                            specimen(s) by brushing or washing, when  performed                            (separate procedure) Diagnosis Code(s):        --- Professional ---                           R11.2, Nausea with vomiting, unspecified CPT copyright 2022 American Medical Association. All rights reserved. The codes documented in this report are preliminary and upon coder review may  be revised to meet current compliance requirements. Jeani Hawking, MD Jeani Hawking, MD 02/12/2023 4:12:40 PM This report has been signed electronically. Number of Addenda: 0

## 2023-02-12 NOTE — Anesthesia Preprocedure Evaluation (Signed)
Anesthesia Evaluation  Patient identified by MRN, date of birth, ID band Patient awake    Reviewed: Allergy & Precautions, H&P , NPO status , Patient's Chart, lab work & pertinent test results  Airway Mallampati: II  TM Distance: >3 FB Neck ROM: Full    Dental no notable dental hx.    Pulmonary neg pulmonary ROS, former smoker   Pulmonary exam normal breath sounds clear to auscultation       Cardiovascular hypertension, + CAD, + Past MI, + Cardiac Stents and + Peripheral Vascular Disease  Normal cardiovascular exam Rhythm:Regular Rate:Normal     Neuro/Psych   Anxiety Depression    CVA    GI/Hepatic Neg liver ROS, PUD,GERD  ,,  Endo/Other  diabetes, Poorly Controlled, Insulin DependentHypothyroidism    Renal/GU DialysisRenal disease  negative genitourinary   Musculoskeletal negative musculoskeletal ROS (+)    Abdominal   Peds negative pediatric ROS (+)  Hematology negative hematology ROS (+)   Anesthesia Other Findings   Reproductive/Obstetrics negative OB ROS                             Anesthesia Physical Anesthesia Plan  ASA: 4  Anesthesia Plan: MAC   Post-op Pain Management:    Induction: Intravenous  PONV Risk Score and Plan: 2 and Propofol infusion and Treatment may vary due to age or medical condition  Airway Management Planned: Nasal Cannula  Additional Equipment:   Intra-op Plan:   Post-operative Plan:   Informed Consent: I have reviewed the patients History and Physical, chart, labs and discussed the procedure including the risks, benefits and alternatives for the proposed anesthesia with the patient or authorized representative who has indicated his/her understanding and acceptance.     Dental advisory given  Plan Discussed with: CRNA and Surgeon  Anesthesia Plan Comments:        Anesthesia Quick Evaluation

## 2023-02-12 NOTE — Progress Notes (Signed)
PT Cancellation Note  Patient Details Name: Susan [name] MRN: [id number] DOB: [birth year]   Cancelled Treatment:    Reason Eval/Treat Not Completed: Patient at procedure or test/unavailable HD   [name] B [name] 02/12/2023, 8:45 AM Merryl Hacker, PT Acute Rehabilitation Services Office: [phone]

## 2023-02-12 NOTE — Progress Notes (Signed)
[facility] Progress Note   Subjective:  Seen in [facility]. No new events overnight. Still with nausea and not tolerating much PO.  Objective Vitals:   02/12/23 0900 02/12/23 0930 02/12/23 1000 02/12/23 1030  BP: (!) 155/106 (!) 146/59 (!) 112/44 (!) 118/45  Pulse: 77 74 76 76  Resp: 10 14 14 15   Temp:      TempSrc:      SpO2: 100% 100% 99% 100%  Weight:      Height:       Physical Exam General: Chronically ill appearing woman, NAD. Nasal O2 in place. Heart: RRR; no murmur Lungs: CTA anteriorly Abdomen: soft, non-tender Extremities: R BKA, 1+ LLE edema Dialysis Access: [facility]  Additional Objective Labs: Basic Metabolic Panel: Recent Labs  Lab 02/10/23 1003 02/11/23 0409 02/12/23 0648  NA 127* 133* 134*  K 3.3* 3.4* 3.9  CL 93* 94* 95*  CO2 26 27 23   GLUCOSE 326* 157* 150*  BUN 35* 16 25*  CREATININE 5.07* 3.35* 4.68*  CALCIUM 8.2* 8.8* 9.0  PHOS 4.9* 3.5 4.2   Liver Function Tests: Recent Labs  Lab 02/05/23 1114 02/06/23 0726 02/10/23 1003 02/11/23 0409 02/12/23 0648  AST 21  --   --   --   --   ALT 21  --   --   --   --   ALKPHOS 222*  --   --   --   --   BILITOT 0.7  --   --   --   --   PROT 7.4  --   --   --   --   ALBUMIN 3.5   < > 2.6* 2.8* 2.9*   < > = values in this interval not displayed.   Recent Labs  Lab 02/05/23 1114  LIPASE 23   CBC: Recent Labs  Lab 02/06/23 0726 02/07/23 0810 02/10/23 1002 02/11/23 0409 02/12/23 0652  WBC 6.6 5.8 6.1 6.2 6.4  NEUTROABS  --  4.2  --   --   --   HGB 10.6* 10.8* 10.3* 11.2* 11.3*  HCT 35.9* 35.0* 34.8* 37.7 38.3  MCV 76.9* 77.3* 76.5* 77.1* 77.8*  PLT 215 221 198 199 211   Studies/Results: No results found.  Medications:  fluconazole (DIFLUCAN) IV Stopped (02/11/23 1315)    (feeding supplement) PROSource Plus  30 mL Oral BID BM   amiodarone  200 mg Oral Daily   atorvastatin  80 mg Oral Daily   calcitRIOL  0.75 mcg Oral Q T,Th,Sa-HD   carvedilol  6.25 mg Oral BID WC    Chlorhexidine Gluconate Cloth  6 each Topical Q0600   clonazePAM  0.5 mg Oral BID   ferric citrate  210 mg Oral TID WC   insulin aspart  0-6 Units Subcutaneous TID WC   insulin aspart  2 Units Subcutaneous TID WC   insulin glargine-yfgn  8 Units Subcutaneous Q24H   levothyroxine  75 mcg Oral Q0600   metoCLOPramide (REGLAN) injection  5 mg Intravenous TID AC   mirtazapine  7.5 mg Oral QHS   pantoprazole  40 mg Oral BID   polyethylene glycol  17 g Oral Daily   venlafaxine XR  75 mg Oral Q breakfast    Dialysis Orders: TTS [facility] 4hr, 400/800, EDW 79kg, 2K/2Ca bath, TDC, Heparin 4000 + 2000 midrun - Aranesp given 9/3 during recent admit - Calcitriol 0.44mcg PO q HD - Binder recently changed from renvela to [place] Just discharged 02/02/2023   Assessment/Plan: 1. Intractable  nausea/vomiting: Just discharged with same, readmitted 9/12. ?Felt due to colitis and/or gastroparesis, uncontrolled BS on admit. S/p 3 doses of Reglan. Per primary.  2. ESRD: Usual TTS schedule - HD today  3. HTN/volume: BP variable, does have edema on exam as well as hypoNa. UF as tolerated. 4. Anemia: Hgb 10-11 - follow without ESA for now. 5. Secondary hyperparathyroidism: CorrCa ok, Phos low. Reducing Auryxia to 1/meals. 6. Nutrition: Alb low, adding supps. 7. T2DM: BS > 600 on admit - better with insulin. 8. CAD 9. pAF: On Eliquis    Susan [name] Susan Givens PA-C [facility] 02/12/2023,11:01 AM

## 2023-02-12 NOTE — Interval H&P Note (Signed)
History and Physical Interval Note:  02/12/2023 3:40 PM  Susan [name]  has presented today for surgery, with the diagnosis of Nausea and vomiting.  The various methods of treatment have been discussed with the patient and family. After consideration of risks, benefits and other options for treatment, the patient has consented to  Procedure(s): ESOPHAGOGASTRODUODENOSCOPY (EGD) (N/A) as a surgical intervention.  The patient's history has been reviewed, patient examined, no change in status, stable for surgery.  I have reviewed the patient's chart and labs.  Questions were answered to the patient's satisfaction.     [name]

## 2023-02-12 NOTE — Anesthesia Postprocedure Evaluation (Signed)
Anesthesia Post Note  Patient: Susan [name]  Procedure(s) Performed: ESOPHAGOGASTRODUODENOSCOPY (EGD)     Patient location during evaluation: PACU Anesthesia Type: MAC Level of consciousness: awake and alert Pain management: pain level controlled Vital Signs Assessment: post-procedure vital signs reviewed and stable Respiratory status: spontaneous breathing, nonlabored ventilation, respiratory function stable and patient connected to nasal cannula oxygen Cardiovascular status: stable and blood pressure returned to baseline Postop Assessment: no apparent nausea or vomiting Anesthetic complications: no  No notable events documented.  Last Vitals:  Vitals:   02/12/23 1600 02/12/23 1615  BP: (!) 120/42 (!) 126/43  Pulse: 75 81  Resp: 12 15  Temp: 36.6 C   SpO2: 98% 97%    Last Pain:  Vitals:   02/12/23 1615  TempSrc:   PainSc: 0-No pain                 Jonnette Nuon S

## 2023-02-12 NOTE — Progress Notes (Signed)
Occupational Therapy Treatment Patient Details Name: Susan [name] MRN: [id number] DOB: [birth year] Today's Date: 02/12/2023   History of present illness 72 y.o. female admitted 9/12 with nausea and vomiting in setting of hyperosmolar hyperglycemic state. PMHx: Pt recently admitted 8/22-8/24 and 8/27-9/9 for similar symptoms, found to have proctocolitis as well as early DKA. OA, CAD, chronic diastolic HF, depression, ESRD on HD TTS, HTN, HLD, CVA, T2DM, Rt BKA.   OT comments  Limited session this am due to patient's continued complaints of nausea. Patient declined getting to EOB or performing grooming or other self care tasks. Patient agreeable to ROM exercises for BUE shoulders and strengthening for elbow flexion/extension with level one therapy band. Patient encouraged to continue to participate with therapy and progress to OOB. Acute OT to continue to follow.       If plan is discharge home, recommend the following:  A lot of help with walking and/or transfers;A lot of help with bathing/dressing/bathroom;Assistance with cooking/housework;Help with stairs or ramp for entrance;Assist for transportation   Equipment Recommendations  Other (comment) (drop arm BSC if current BSC does not have drop arm)    Recommendations for Other Services      Precautions / Restrictions Precautions Precautions: Fall Precaution Comments: R BKA, LLE wound Restrictions Weight Bearing Restrictions: No RLE Weight Bearing: Weight bearing as tolerated (BKA)       Mobility Bed Mobility Overal bed mobility: Needs Assistance Bed Mobility: Rolling Rolling: Mod assist         General bed mobility comments: rolling for positioning in bed    Transfers Overall transfer level: Needs assistance                 General transfer comment: patient declined OOB     Balance Overall balance assessment: Needs assistance     Sitting balance - Comments: Patient declined getting to EOB due to complaints of  nausea                                   ADL either performed or assessed with clinical judgement   ADL Overall ADL's : Needs assistance/impaired                                       General ADL Comments: declined self care tasks    Extremity/Trunk Assessment              Vision       Perception     Praxis      Cognition Arousal: Alert Behavior During Therapy: WFL for tasks assessed/performed Overall Cognitive Status: Within Functional Limits for tasks assessed                                 General Comments: continues with complaints of nausea        Exercises Exercises: General Upper Extremity General Exercises - Upper Extremity Shoulder Flexion: AAROM, Right, AROM, Left, 10 reps, Supine Shoulder ABduction: AAROM, Right, AROM, Left, 10 reps, Supine Shoulder Horizontal ABduction: AAROM, Right, AROM, Left, Supine Elbow Flexion: Strengthening, Both, Supine, 10 reps Elbow Extension: Strengthening, Both, 10 reps, Supine    Shoulder Instructions       General Comments      Pertinent Vitals/ Pain  Pain Assessment Pain Assessment: Faces Faces Pain Scale: Hurts little more Pain Location: RUE shoulder with ROM Pain Descriptors / Indicators: Guarding, Grimacing Pain Intervention(s): Limited activity within patient's tolerance, Monitored during session, Repositioned  Home Living                                          Prior Functioning/Environment              Frequency  Min 1X/week        Progress Toward Goals  OT Goals(current goals can now be found in the care plan section)  Progress towards OT goals: Not progressing toward goals - comment (limited due to nausea)  Acute Rehab OT Goals Patient Stated Goal: get better OT Goal Formulation: With patient Time For Goal Achievement: 02/20/23 Potential to Achieve Goals: Good ADL Goals Pt Will Perform Grooming: with modified  independence;sitting Pt Will Perform Lower Body Dressing: with mod assist;sitting/lateral leans Pt Will Transfer to Toilet: with mod assist;with transfer board;bedside commode Pt/caregiver will Perform Home Exercise Program: Increased strength;Right Upper extremity;Left upper extremity;Both right and left upper extremity;With written HEP provided Additional ADL Goal #1: PT will be S in and OOB for basic ADLs Additional ADL Goal #2: Pt will be able sit EOB unsupported and do 3 grooming activities without LB at CGA level  Plan      Co-evaluation                 AM-PAC OT "6 Clicks" Daily Activity     Outcome Measure   Help from another person eating meals?: None Help from another person taking care of personal grooming?: A Little Help from another person toileting, which includes using toliet, bedpan, or urinal?: Total Help from another person bathing (including washing, rinsing, drying)?: A Lot Help from another person to put on and taking off regular upper body clothing?: A Little Help from another person to put on and taking off regular lower body clothing?: Total 6 Click Score: 14    End of Session    OT Visit Diagnosis: Other abnormalities of gait and mobility (R26.89);Muscle weakness (generalized) (M62.81);Pain Pain - Right/Left: Right Pain - part of body: Shoulder   Activity Tolerance Other (comment) (limited due to complaints of nausea)   Patient Left in bed;with call bell/phone within reach;with bed alarm set   Nurse Communication Other (comment) (continued complaints of nausea)        Time: 5784-6962 OT Time Calculation (min): 17 min  Charges: OT General Charges $OT Visit: 1 Visit OT Treatments $Therapeutic Exercise: 8-22 mins  Alfonse Flavors, OTA Acute Rehabilitation Services  Office [phone]   Dewain Penning 02/12/2023, 9:18 AM

## 2023-02-12 NOTE — Progress Notes (Signed)
   02/12/23 1229  Vitals  Temp (!) 97.3 F (36.3 C)  Pulse Rate 84  Resp 12  BP (!) 115/47  SpO2 100 %  O2 Device Nasal Cannula  Oxygen Therapy  O2 Flow Rate (L/min) 2 L/min  Patient Activity (if Appropriate) In bed  Pulse Oximetry Type Continuous  Oximetry Probe Site Changed No  Post Treatment  Dialyzer Clearance Lightly streaked  Hemodialysis Intake (mL) 0 mL  Liters Processed 81.8  Fluid Removed (mL) 1500 mL  Tolerated HD Treatment Yes   Received patient in bed to unit.  Alert and oriented.  Informed consent signed and in chart.   TX duration:3.5hrs  Patient tolerated well.  Transported back to the room  Alert, without acute distress.  Hand-off given to patient's nurse.   Access used: [facility] Access issues: none  Total UF removed: 1.5L Medication(s) given: none    Susan [name]

## 2023-02-12 NOTE — Transfer of Care (Signed)
Immediate Anesthesia Transfer of Care Note  Patient: Susan [name]  Procedure(s) Performed: ESOPHAGOGASTRODUODENOSCOPY (EGD)  Patient Location: PACU  Anesthesia Type:MAC  Level of Consciousness: sedated and drowsy  Airway & Oxygen Therapy: Patient Spontanous Breathing and Patient connected to nasal cannula oxygen  Post-op Assessment: Report given to RN and Post -op Vital signs reviewed and stable  Post vital signs: Reviewed and stable  Last Vitals:  Vitals Value Taken Time  BP 123/76   Temp 97.7   Pulse 82   Resp 14   SpO2 98     Last Pain:  Vitals:   02/12/23 1431  TempSrc: Tympanic  PainSc: 0-No pain      Patients Stated Pain Goal: 2 (02/05/23 1921)  Complications: No notable events documented.

## 2023-02-13 ENCOUNTER — Inpatient Hospital Stay: Payer: PPO | Admitting: Family Medicine

## 2023-02-13 DIAGNOSIS — N186 End stage renal disease: Secondary | ICD-10-CM | POA: Diagnosis not present

## 2023-02-13 DIAGNOSIS — E1100 Type 2 diabetes mellitus with hyperosmolarity without nonketotic hyperglycemic-hyperosmolar coma (NKHHC): Secondary | ICD-10-CM | POA: Diagnosis not present

## 2023-02-13 DIAGNOSIS — I5020 Unspecified systolic (congestive) heart failure: Secondary | ICD-10-CM | POA: Diagnosis not present

## 2023-02-13 DIAGNOSIS — I480 Paroxysmal atrial fibrillation: Secondary | ICD-10-CM | POA: Diagnosis not present

## 2023-02-13 LAB — GLUCOSE, CAPILLARY
Glucose-Capillary: 376 mg/dL — ABNORMAL HIGH (ref 70–99)
Glucose-Capillary: 335 mg/dL — ABNORMAL HIGH (ref 70–99)
Glucose-Capillary: 391 mg/dL — ABNORMAL HIGH (ref 70–99)
Glucose-Capillary: 345 mg/dL — ABNORMAL HIGH (ref 70–99)

## 2023-02-13 MED ORDER — PANTOPRAZOLE SODIUM 40 MG IV SOLR
40.0000 mg | Freq: Once | INTRAVENOUS | Status: AC
  Administered 2023-02-13: 40 mg via INTRAVENOUS
  Filled 2023-02-13: qty 10

## 2023-02-13 MED ORDER — PROCHLORPERAZINE EDISYLATE 10 MG/2ML IJ SOLN
5.0000 mg | INTRAMUSCULAR | Status: AC
  Administered 2023-02-13: 5 mg via INTRAVENOUS
  Filled 2023-02-13: qty 2

## 2023-02-13 MED ORDER — SODIUM CHLORIDE 0.9% FLUSH
10.0000 mL | Freq: Two times a day (BID) | INTRAVENOUS | Status: DC
  Administered 2023-02-14 – 2023-02-17 (×5): 10 mL

## 2023-02-13 MED ORDER — SODIUM CHLORIDE 0.9% FLUSH: 10.0000 mL | INTRAVENOUS | Status: DC | PRN

## 2023-02-13 MED ORDER — FAMOTIDINE 20 MG PO TABS
20.0000 mg | ORAL_TABLET | Freq: Every day | ORAL | Status: DC
  Administered 2023-02-13 – 2023-02-17 (×5): 20 mg via ORAL
  Filled 2023-02-13 (×5): qty 1

## 2023-02-13 MED ORDER — INSULIN GLARGINE-YFGN 100 UNIT/ML ~~LOC~~ SOLN
12.0000 [IU] | SUBCUTANEOUS | Status: DC
  Administered 2023-02-13: 12 [IU] via SUBCUTANEOUS
  Filled 2023-02-13: qty 0.12

## 2023-02-13 NOTE — Progress Notes (Signed)
PROGRESS NOTE        PATIENT DETAILS Name: Susan [name] Age: 72 y.o. Sex: female Date of Birth: 05/24/1951 Admit Date: 02/05/2023 Admitting Physician Alan Mulder, MD WUJ:[id number], Priscille Heidelberg, MD  Brief Summary: Patient is a 72 y.o.  female with history of ESRD on HD TTS, DM-2, PAF, CAD, HFrEF, HTN, PAD s/p right BKA-presented with nausea/vomiting in the setting of hyperosmolar hyperglycemic state.  Hospital course complicated by intractable nausea and vomiting-see below for further details.  Significant events: 9/12>> admit to [name]  Significant studies: 9/14>> CT abdomen: No SBO  Significant microbiology data: None  Procedures: 9/19>> EGD: Normal esophagus, bilious gastric fluid  Consults: Nephrology  Subjective: 1 episode of vomiting last night-some nausea-claims that vomiting was because she ate all the food in her tray.  Objective: Vitals: Blood pressure (!) 132/59, pulse 97, temperature 97.9 F (36.6 C), temperature source Oral, resp. rate 15, height 5\' 5"  (1.651 m), weight 80 kg, SpO2 100%.   Exam: Gen Exam:Alert awake-not in any distress HEENT:atraumatic, normocephalic Chest: B/L clear to auscultation anteriorly CVS:S1S2 regular Abdomen:soft non tender, non distended Extremities:no edema-right BKA. Neurology: Non focal Skin: no rash  Pertinent Labs/Radiology:    Latest Ref Rng & Units 02/12/2023    6:52 AM 02/11/2023    4:09 AM 02/10/2023   10:02 AM  CBC  WBC 4.0 - 10.5 K/uL 6.4  6.2  6.1   Hemoglobin 12.0 - 15.0 g/dL 82.9  56.2  13.0   Hematocrit 36.0 - 46.0 % 38.3  37.7  34.8   Platelets 150 - 400 K/uL 211  199  198     Lab Results  Component Value Date   NA 134 (L) 02/12/2023   K 3.9 02/12/2023   CL 95 (L) 02/12/2023   CO2 23 02/12/2023      Assessment/Plan: Hyperosmolar hyperglycemic state Required insulin infusion-has been transitioned to SQ insulin over night  DM-2 with uncontrolled hyperglycemia (A1c 7.9 on  4/3) CBG is slowly increasing-still somewhat nauseous-occasionally vomits  Allow permissive hyperglycemia-not a candidate for strict glycemic control in the situation Increase Semglee to 12 units-continue SSI and 2 units of NovoLog with meals.    Recent Labs    02/12/23 1734 02/12/23 2333 02/13/23 0812  GLUCAP 107* 318* 376*     Nausea/vomiting likely due to gastroparesis flare CT abdomen on 9/14 negative for SBO Continues to have intermittent nausea and vomiting-in spite of being on numerous antiemetics  Evaluated by GI on 9/19-endoscopy without any major findings apart from bilious gastric secretions.   Continue PPI twice daily dosing Add Pepcid Add Questran  Stop IV Diflucan as no evidence of candidiasis (this was seen on prior EGD earlier this year)  Continue to educate/emphasize on small portion meals  Her QTc is somewhat prolonged-and will need to be watched closely.  This also limits use of some of the antiemetics.  Asymptomatic bacteriuria UA dirty but no symptoms-makes very little urine No longer on Cefepime-UTI ruled out.  Chronic HFrEF Volume removal with HD  CAD No anginal symptoms Blocker/statin Suspect not on ASA due to being on Eliquis.  PAF Telemetry monitoring Coreg/amiodarone Eliquis briefly held for EGD but has since been resumed.   ESRD on HD TTS Nephrology following  Hypokalemia Repleted  Hyponatremia Due to excess volume Mild-asymptomatic Should improved with HD.  Normocytic anemia Secondary to CKD Follow  CBC and transfuse if significant drop  PAD-s/p right BKA  Depression/anxiety Relatively stable Effexor/Remeron/Klonopin  Hypothyroidism Synthroid TSH on 8/22 stable  Debility/deconditioning PT/OT eval-Home health recommended  Chronic venous ulcer-left lateral/posterior leg Present prior to admission Follows with Dr. Lajoyce Corners Continue dressing changes per wound care team.  BMI: Estimated body mass index is 29.35 kg/m as  calculated from the following:   Height as of this encounter: 5\' 5"  (1.651 m).   Weight as of this encounter: 80 kg.   Code status:   Code Status: Full Code   DVT Prophylaxis: SCDs Start: 02/05/23 1719 apixaban (ELIQUIS) tablet 5 mg    Family Communication: None at bedside   Disposition Plan: Status is: Inpatient Remains inpatient appropriate because:    Planned Discharge Destination:Home   Diet: Diet Order             Diet full liquid Fluid consistency: Thin  Diet effective now                     Antimicrobial agents: Anti-infectives (From admission, onward)    Start     Dose/Rate Route Frequency Ordered Stop   02/10/23 1100  fluconazole (DIFLUCAN) IVPB 200 mg  Status:  Discontinued        200 mg 100 mL/hr over 60 Minutes Intravenous Every 24 hours 02/10/23 1045 02/12/23 1714   02/06/23 1600  ceFEPIme (MAXIPIME) 1 g in sodium chloride 0.9 % 100 mL IVPB  Status:  Discontinued        1 g 200 mL/hr over 30 Minutes Intravenous Every 24 hours 02/05/23 2044 02/06/23 0935   02/05/23 2130  ceFEPIme (MAXIPIME) 1 g in sodium chloride 0.9 % 100 mL IVPB  Status:  Discontinued        1 g 200 mL/hr over 30 Minutes Intravenous Every 24 hours 02/05/23 2041 02/05/23 2044   02/05/23 1630  ceFEPIme (MAXIPIME) 2 g in sodium chloride 0.9 % 100 mL IVPB        2 g 200 mL/hr over 30 Minutes Intravenous  Once 02/05/23 1625 02/05/23 1737        MEDICATIONS: Scheduled Meds:  (feeding supplement) PROSource Plus  30 mL Oral BID BM   amiodarone  200 mg Oral Daily   apixaban  5 mg Oral BID   atorvastatin  80 mg Oral Daily   calcitRIOL  0.75 mcg Oral Q T,Th,Sa-HD   carvedilol  6.25 mg Oral BID WC   Chlorhexidine Gluconate Cloth  6 each Topical Q0600   cholestyramine  4 g Oral TID   clonazePAM  0.5 mg Oral BID   famotidine  20 mg Oral Daily   ferric citrate  210 mg Oral TID WC   insulin aspart  0-6 Units Subcutaneous TID WC   insulin aspart  2 Units Subcutaneous TID WC    insulin glargine-yfgn  8 Units Subcutaneous Q24H   levothyroxine  75 mcg Oral Q0600   metoCLOPramide (REGLAN) injection  5 mg Intravenous TID AC   mirtazapine  7.5 mg Oral QHS   pantoprazole  40 mg Oral BID   polyethylene glycol  17 g Oral Daily   venlafaxine XR  75 mg Oral Q breakfast   Continuous Infusions:    PRN Meds:.acetaminophen **OR** acetaminophen, bisacodyl, dextrose, methocarbamol, midodrine, trimethobenzamide   I have personally reviewed following labs and imaging studies  LABORATORY DATA: CBC: Recent Labs  Lab 02/07/23 0810 02/10/23 1002 02/11/23 0409 02/12/23 0652  WBC 5.8 6.1 6.2 6.4  NEUTROABS  4.2  --   --   --   HGB 10.8* 10.3* 11.2* 11.3*  HCT 35.0* 34.8* 37.7 38.3  MCV 77.3* 76.5* 77.1* 77.8*  PLT 221 198 199 211    Basic Metabolic Panel: Recent Labs  Lab 02/07/23 0417 02/08/23 1020 02/10/23 1003 02/11/23 0409 02/12/23 0648 02/12/23 0652  NA 132* 133* 127* 133* 134*  --   K 3.1* 3.7 3.3* 3.4* 3.9  --   CL 95* 96* 93* 94* 95*  --   CO2 27 27 26 27 23   --   GLUCOSE 259* 171* 326* 157* 150*  --   BUN 20 16 35* 16 25*  --   CREATININE 2.90* 2.84* 5.07* 3.35* 4.68*  --   CALCIUM 8.4* 9.2 8.2* 8.8* 9.0  --   MG 1.9 2.5*  --  1.9  --  2.3  PHOS 2.4* 4.0 4.9* 3.5 4.2  --     GFR: Estimated Creatinine Clearance: 11.2 mL/min (A) (by C-G formula based on SCr of 4.68 mg/dL (H)).  Liver Function Tests: Recent Labs  Lab 02/07/23 0417 02/08/23 1020 02/10/23 1003 02/11/23 0409 02/12/23 0648  ALBUMIN 2.5* 3.0* 2.6* 2.8* 2.9*   No results for input(s): "LIPASE", "AMYLASE" in the last 168 hours.  No results for input(s): "AMMONIA" in the last 168 hours.  Coagulation Profile: No results for input(s): "INR", "PROTIME" in the last 168 hours.  Cardiac Enzymes: No results for input(s): "CKTOTAL", "CKMB", "CKMBINDEX", "TROPONINI" in the last 168 hours.  BNP (last 3 results) No results for input(s): "PROBNP" in the last 8760 hours.  Lipid  Profile: No results for input(s): "CHOL", "HDL", "LDLCALC", "TRIG", "CHOLHDL", "LDLDIRECT" in the last 72 hours.  Thyroid Function Tests: No results for input(s): "TSH", "T4TOTAL", "FREET4", "T3FREE", "THYROIDAB" in the last 72 hours.  Anemia Panel: No results for input(s): "VITAMINB12", "FOLATE", "FERRITIN", "TIBC", "IRON", "RETICCTPCT" in the last 72 hours.  Urine analysis:    Component Value Date/Time   COLORURINE AMBER (A) 02/05/2023 1418   APPEARANCEUR TURBID (A) 02/05/2023 1418   LABSPEC 1.015 02/05/2023 1418   PHURINE 6.0 02/05/2023 1418   GLUCOSEU >=500 (A) 02/05/2023 1418   HGBUR MODERATE (A) 02/05/2023 1418   BILIRUBINUR NEGATIVE 02/05/2023 1418   BILIRUBINUR negative 06/05/2020 1334   KETONESUR 5 (A) 02/05/2023 1418   PROTEINUR 100 (A) 02/05/2023 1418   UROBILINOGEN 0.2 06/05/2020 1334   UROBILINOGEN 0.2 04/01/2014 1659   NITRITE NEGATIVE 02/05/2023 1418   LEUKOCYTESUR LARGE (A) 02/05/2023 1418    Sepsis Labs: Lactic Acid, Venous    Component Value Date/Time   LATICACIDVEN 0.9 11/20/2022 0116    MICROBIOLOGY: No results found for this or any previous visit (from the past 240 hour(s)).  RADIOLOGY STUDIES/RESULTS: No results found.   LOS: 8 days   Jeoffrey Massed, MD  [facility]    To contact the attending provider between 7A-7P or the covering provider during after hours 7P-7A, please log into the web site [email or web address] and access using universal [place] password for that web site. If you do not have the password, please call the hospital operator.  02/13/2023, 10:53 AM

## 2023-02-13 NOTE — TOC Progression Note (Signed)
Transition of Care [facility]) - Progression Note    Patient Details  Name: Susan [name] MRN: [id number] Date of Birth: 1951-01-07  Transition of Care [facility] CM/SW Contact  Gordy Clement, RN Phone Number: 02/13/2023, 3:26 PM  Clinical Narrative:     Patient is being recommended a drop arm Bedside Com[employer]. CM has ordered one and [employer] will deliver bedside prior to dc.  [facility] will provide SN/PT/OT/ Aide and SW ([facility]).  AVS has been updated.  Family will transport at DC.   TOC will continue to follow patient for any additional discharge needs          Barriers to Discharge: Continued Medical Work up  Expected Discharge Plan and Services   Discharge Planning Services: CM Consult   Living arrangements for the past 2 months: Single Family Home                                       Social Determinants of Health (SDOH) Interventions SDOH Screenings   Food Insecurity: No Food Insecurity (02/11/2023)  Housing: Low Risk  (02/11/2023)  Transportation Needs: No Transportation Needs (02/11/2023)  Utilities: Not At Risk (02/11/2023)  Alcohol Screen: Low Risk  (08/16/2021)  Depression (PHQ2-9): Low Risk  (09/29/2022)  Financial Resource Strain: Low Risk  (08/16/2021)  Physical Activity: Inactive (08/16/2021)  Social Connections: Moderately Isolated (08/16/2021)  Stress: No Stress Concern Present (08/16/2021)  Tobacco Use: Medium Risk (02/12/2023)    Readmission Risk Interventions    01/21/2023    3:09 PM 09/04/2022    5:11 PM 05/12/2022    5:04 PM  Readmission Risk Prevention Plan  Transportation Screening Complete Complete Complete  Medication Review (RN Care Manager) Referral to Pharmacy Complete   PCP or Specialist appointment within 3-5 days of discharge Complete Complete   HRI or Home Care Consult Complete Complete   SW Recovery Care/Counseling Consult Complete Complete Complete  Palliative Care Screening Not Applicable Not Applicable   Skilled Nursing Facility  Not Applicable Complete Complete

## 2023-02-13 NOTE — Plan of Care (Signed)
  Problem: Education: Goal: Knowledge of the prescribed therapeutic regimen will improve Outcome: Progressing Goal: Ability to verbalize activity precautions or restrictions will improve Outcome: Progressing   Problem: Activity: Goal: Ability to perform//tolerate increased activity and mobilize with assistive devices will improve Outcome: Progressing   Problem: Self-Concept: Goal: Ability to maintain and perform role responsibilities to the fullest extent possible will improve Outcome: Progressing   Problem: Pain Management: Goal: Pain level will decrease with appropriate interventions Outcome: Progressing   Problem: Skin Integrity: Goal: Demonstration of wound healing without infection will improve Outcome: Progressing   Problem: Activity: Goal: Ability to return to baseline activity level will improve Outcome: Progressing   Problem: Education: Goal: Ability to describe self-care measures that may prevent or decrease complications (Diabetes Survival Skills Education) will improve Outcome: Progressing   Problem: Health Behavior/Discharge Planning: Goal: Ability to manage health-related needs will improve Outcome: Progressing   Problem: Fluid Volume: Goal: Ability to achieve a balanced intake and output will improve Outcome: Progressing

## 2023-02-13 NOTE — Progress Notes (Signed)
Physical Therapy Treatment Patient Details Name: Susan [name] MRN: [id number] DOB: [birth year] Today's Date: 02/13/2023   History of Present Illness 72 y.o. female admitted 9/12 with nausea and vomiting in setting of hyperosmolar hyperglycemic state. PMHx: Pt recently admitted 8/22-8/24 and 8/27-9/9 for similar symptoms, found to have proctocolitis as well as early DKA. OA, CAD, chronic diastolic HF, depression, ESRD on HD TTS, HTN, HLD, CVA, T2DM, Rt BKA.    PT Comments  Pt received in supine, reluctant to participate in therapies due to nausea/fatigue and reporting she had not yet eaten and was not interested in food. With max encouragement, pt agreeable to participate in collaborative PT/OT session due to significant fatigue for energy conservation, pt needing up to +2 maxA for bed<>wheelchair slide board transfers. Pt instructed on wheelchair parts/safety and needing min to maxA variable assist to propel wheelchair with increased assist needed to turn and for locking/unlocking brakes. Pt not yet progressed to transfer with +1 assist so currently recommending short term lower intensity post-acute therapies <3 hours/day upon DC but if pt able to demonstrate ability to transfer OOB with +1 assist, could consider change back to [facility] pending progress.   If plan is discharge home, recommend the following: Assistance with cooking/housework;Assist for transportation;Help with stairs or ramp for entrance;Two people to help with walking and/or transfers;A lot of help with bathing/dressing/bathroom;Supervision due to cognitive status   Can travel by private vehicle     No  Equipment Recommendations  None recommended by PT (hoyer lift if she does not have one)    Recommendations for Other Services       Precautions / Restrictions Precautions Precautions: Fall Precaution Comments: R BKA, LLE wound Restrictions Weight Bearing Restrictions: No RLE Weight Bearing: Weight bearing as tolerated (BKA) LLE  Weight Bearing: Weight bearing as tolerated Other Position/Activity Restrictions: per pt chart she reports Dr. Lajoyce Corners does not want her to put any weight on her LLE due to skin graft - sent message to Dr. Lajoyce Corners to clarify on 02/06/23 at 10:27 AM on 02/06/23 Dr. Lajoyce Corners states pt is full WB     Mobility  Bed Mobility Overal bed mobility: Needs Assistance Bed Mobility: Rolling, Sidelying to Sit, Sit to Sidelying Rolling: Mod assist Sidelying to sit: Used rails, Mod assist     Sit to sidelying: Mod assist, +2 for physical assistance, Used rails General bed mobility comments: Cues for self-assist/sequencing, pt needing more assist today due to c/o nausea and reluctance to initiate.    Transfers Overall transfer level: Needs assistance Equipment used: Sliding board Transfers: Bed to chair/wheelchair/BSC            Lateral/Scoot Transfers: Max assist, +2 physical assistance, With slide board, From elevated surface General transfer comment: EOB<>wheelchair, +2 therapist assist via bed pad and dense cues for improved body mechanics, pt using LLE and BUE for stability.    Ambulation/Gait                   Psychologist, counselling mobility: Yes Wheelchair propulsion: Both upper extremities Wheelchair parts: Needs assistance Distance: 20 Wheelchair Assistance Details (indicate cue type and reason): min to maxA, variable assist as pt fatigued and needs heavy assist while turning due to BUE fatigue and difficulty sequencing extremities while turning; wheelchair too wide for her which likely increased difficulty. Pt c/o severe fatigue after ~89ft and turning wheelchair to go back into room. Dense multimodal  cues for wheelchair parts and how to propel.   Tilt Bed    Modified Rankin (Stroke Patients Only)       Balance Overall balance assessment: Needs assistance Sitting-balance support: No upper extremity supported, Feet  supported, Bilateral upper extremity supported Sitting balance-Leahy Scale: Good Sitting balance - Comments: static sitting good with 1-2 UE support, fair unsupported.       Standing balance comment: defer, pt too nauseated/fatigued to perform so instead worked on lateral scooting                            Cognition Arousal: Alert Behavior During Therapy: WFL for tasks assessed/performed Overall Cognitive Status: Within Functional Limits for tasks assessed                                 General Comments: Continues with complaints of nausea and self-limiting but with max encouragement pt participates and following simple commands well with some delay.        Exercises General Exercises - Upper Extremity Chair Push Up: AROM, Both, Seated (x1 rep to lift hips off wheelchair seat for removal of wet chair/bed pad; c/o fatigue after one rep) Amputee Exercises Quad Sets: AROM, Both, 5 reps, Supine (encouraged her to perform t/o day) Hip Flexion/Marching: AROM, Both, 5 reps, Seated    General Comments General comments (skin integrity, edema, etc.): BP 106/74 (82) sitting EOB HR 93 bpm sitting and to 102 bpm with exertion. SpO2 98% on      Pertinent Vitals/Pain Pain Assessment Pain Assessment: Faces Faces Pain Scale: Hurts little more Pain Location: generalized Pain Descriptors / Indicators: Grimacing Pain Intervention(s): Monitored during session, Limited activity within patient's tolerance, Repositioned    Home Living                          Prior Function            PT Goals (current goals can now be found in the care plan section) Acute Rehab PT Goals Patient Stated Goal: Return home with HHPT PT Goal Formulation: With patient Time For Goal Achievement: 02/20/23 Progress towards PT goals: Progressing toward goals    Frequency    Min 1X/week      PT Plan      Co-evaluation PT/OT/SLP Co-Evaluation/Treatment: Yes Reason  for Co-Treatment: For patient/therapist safety;To address functional/ADL transfers (too nauseated/fatigued for x2 sessions today) PT goals addressed during session: Mobility/safety with mobility;Balance;Proper use of DME;Strengthening/ROM        AM-PAC PT "6 Clicks" Mobility   Outcome Measure  Help needed turning from your back to your side while in a flat bed without using bedrails?: A Lot Help needed moving from lying on your back to sitting on the side of a flat bed without using bedrails?: A Lot Help needed moving to and from a bed to a chair (including a wheelchair)?: Total Help needed standing up from a chair using your arms (e.g., wheelchair or bedside chair)?: Total Help needed to walk in hospital room?: Total Help needed climbing 3-5 steps with a railing? : Total 6 Click Score: 8    End of Session Equipment Utilized During Treatment: Gait belt;Oxygen Activity Tolerance: Patient tolerated treatment well;Patient limited by fatigue Patient left: in bed;with call bell/phone within reach;with bed alarm set;Other (comment) (HOB 30* for reduced risk of aspiration, LLE elevated to  float heel) Nurse Communication: Mobility status PT Visit Diagnosis: Other abnormalities of gait and mobility (R26.89);Muscle weakness (generalized) (M62.81)     Time: 2956-2130 PT Time Calculation (min) (ACUTE ONLY): 30 min  Charges:    $Wheel Chair Management: 8-22 mins PT General Charges $$ ACUTE PT VISIT: 1 Visit                     Jessee Newnam P., PTA Acute Rehabilitation Services Secure Chat Preferred 9a-5:30pm Office: [phone]    Dorathy Kinsman [facility] 02/13/2023, 12:47 PM

## 2023-02-13 NOTE — Progress Notes (Signed)
[name] Progress Note   Subjective: Awake alert, says her abdomen still hurts. SR on monitor. HD 02/12/2023 Net UF 1.5 L     Objective Vitals:   02/12/23 1615 02/13/23 0000 02/13/23 0400 02/13/23 0800  BP: (!) 126/43 131/61 (!) 124/45 (!) 132/59  Pulse: 81 93 91 97  Resp: 15 10 16 15   Temp:  97.7 F (36.5 C) 97.9 F (36.6 C)   TempSrc:  Oral Oral   SpO2: 97% 100% 100%   Weight:      Height:       Physical Exam General: Chronically ill appearing female in NAD Heart: S1,S2 RRR No M/RG SR on monitor Lungs: CTAB Abdomen: NABS, NT, ND Extremities: R BKA LLE with edema on LU thigh Dialysis Access: RIJ TDC drsg intact    Additional Objective Labs: Basic Metabolic Panel: Recent Labs  Lab 02/10/23 1003 02/11/23 0409 02/12/23 0648  NA 127* 133* 134*  K 3.3* 3.4* 3.9  CL 93* 94* 95*  CO2 26 27 23   GLUCOSE 326* 157* 150*  BUN 35* 16 25*  CREATININE 5.07* 3.35* 4.68*  CALCIUM 8.2* 8.8* 9.0  PHOS 4.9* 3.5 4.2   Liver Function Tests: Recent Labs  Lab 02/10/23 1003 02/11/23 0409 02/12/23 0648  ALBUMIN 2.6* 2.8* 2.9*   No results for input(s): "LIPASE", "AMYLASE" in the last 168 hours. CBC: Recent Labs  Lab 02/07/23 0810 02/10/23 1002 02/11/23 0409 02/12/23 0652  WBC 5.8 6.1 6.2 6.4  NEUTROABS 4.2  --   --   --   HGB 10.8* 10.3* 11.2* 11.3*  HCT 35.0* 34.8* 37.7 38.3  MCV 77.3* 76.5* 77.1* 77.8*  PLT 221 198 199 211   Blood Culture    Component Value Date/Time   SDES TISSUE 11/14/2022 0919   SPECREQUEST NONE 11/14/2022 0919   CULT  11/14/2022 0919    FEW PROTEUS MIRABILIS RARE KLEBSIELLA PNEUMONIAE NO ANAEROBES ISOLATED FEW ENTEROCOCCUS FAECALIS    REPTSTATUS 11/19/2022 FINAL 11/14/2022 0919    Cardiac Enzymes: No results for input(s): "CKTOTAL", "CKMB", "CKMBINDEX", "TROPONINI" in the last 168 hours. CBG: Recent Labs  Lab 02/11/23 1800 02/11/23 2017 02/12/23 1734 02/12/23 2333 02/13/23 0812  GLUCAP 164* 161* 107* 318*  376*   Iron Studies: No results for input(s): "IRON", "TIBC", "TRANSFERRIN", "FERRITIN" in the last 72 hours. @lablastinr3 @ Studies/Results: No results found. Medications:   (feeding supplement) PROSource Plus  30 mL Oral BID BM   amiodarone  200 mg Oral Daily   apixaban  5 mg Oral BID   atorvastatin  80 mg Oral Daily   calcitRIOL  0.75 mcg Oral Q T,Th,Sa-HD   carvedilol  6.25 mg Oral BID WC   Chlorhexidine Gluconate Cloth  6 each Topical Q0600   cholestyramine  4 g Oral TID   clonazePAM  0.5 mg Oral BID   ferric citrate  210 mg Oral TID WC   insulin aspart  0-6 Units Subcutaneous TID WC   insulin aspart  2 Units Subcutaneous TID WC   insulin glargine-yfgn  8 Units Subcutaneous Q24H   levothyroxine  75 mcg Oral Q0600   metoCLOPramide (REGLAN) injection  5 mg Intravenous TID AC   mirtazapine  7.5 mg Oral QHS   pantoprazole  40 mg Oral BID   polyethylene glycol  17 g Oral Daily   venlafaxine XR  75 mg Oral Q breakfast      Dialysis Orders: TTS [facility] 4hr, 400/800, EDW 79kg, 2K/2Ca bath, TDC,  - Heparin 4000 units IV initial  bolus + 2000 units IV midrun - Aranesp given 9/3 during recent admit - Calcitriol 0.16mcg PO q HD - Binder recently changed from renvela to [place] Just discharged 02/02/2023   Assessment/Plan: 1. Intractable nausea/vomiting: Just discharged with same, readmitted 9/12. ?Felt due to colitis and/or gastroparesis, uncontrolled BS on admit. S/p 3 doses of Reglan. Seen by GI went for upper endo. Normal esophagus, no intervention. Per primary. .  2. ESRD: Usual TTS schedule -HD 02/14/2023 3. HTN/volume: BP variable, does have edema on exam as well as hypoNa. UF as tolerated. 4. Anemia: Hgb 10-11 - follow without ESA for now. 5. Secondary hyperparathyroidism: CorrCa ok, Phos low. Reducing Auryxia to 1/meals. 6. Nutrition: Alb low, adding supps. 7. T2DM: BS > 600 on admit - better with insulin. 8. CAD 9. pAF: On Eliquis  Susan [name] H. Susan Fotheringham NP-C 02/13/2023,  9:34 AM  [employer] [phone]

## 2023-02-13 NOTE — Progress Notes (Signed)
Occupational Therapy Treatment Patient Details Name: Susan [name] MRN: [id number] DOB: 24-Feb-1951 Today's Date: 02/13/2023   History of present illness 72 y.o. female admitted 9/12 with nausea and vomiting in setting of hyperosmolar hyperglycemic state. PMHx: Pt recently admitted 8/22-8/24 and 8/27-9/9 for similar symptoms, found to have proctocolitis as well as early DKA. OA, CAD, chronic diastolic HF, depression, ESRD on HD TTS, HTN, HLD, CVA, T2DM, Rt BKA.   OT comments  Patient seen with PT to address bed mobility, sitting balance, and transfers. Patient received in supine and required max encouragement due to complaints of nausea. Patient was explained the importance of continued mobility to increase strength. Patient able to get to EOB with mod assist and was assisted into wheelchair with slide board transfer with max assist +2 and education. Patient performed wheelchair mobility in room and returned to supine at end of session due to fatigue. Patient would benefit from continued OT to increase independence and safety with self care and functional transfers.       If plan is discharge home, recommend the following:  A lot of help with walking and/or transfers;A lot of help with bathing/dressing/bathroom;Assistance with cooking/housework;Help with stairs or ramp for entrance;Assist for transportation   Equipment Recommendations  Other (comment) (drop arm BSC if current BSC does not have drop arm)    Recommendations for Other Services      Precautions / Restrictions Precautions Precautions: Fall Precaution Comments: R BKA, LLE wound Restrictions Weight Bearing Restrictions: No RLE Weight Bearing: Weight bearing as tolerated (BKA) LLE Weight Bearing: Weight bearing as tolerated Other Position/Activity Restrictions: per pt chart she reports Dr. Lajoyce Corners does not want her to put any weight on her LLE due to skin graft - sent message to Dr. Lajoyce Corners to clarify on 02/06/23 at 10:27 AM on 02/06/23  Dr. Lajoyce Corners states pt is full WB       Mobility Bed Mobility Overal bed mobility: Needs Assistance Bed Mobility: Rolling, Sidelying to Sit, Sit to Sidelying Rolling: Mod assist Sidelying to sit: Used rails, Mod assist     Sit to sidelying: Mod assist, +2 for physical assistance, Used rails General bed mobility comments: increased time to perform with cues for rail use and to initiate    Transfers Overall transfer level: Needs assistance Equipment used: Sliding board Transfers: Bed to chair/wheelchair/BSC            Lateral/Scoot Transfers: Max assist, +2 physical assistance, With slide board, From elevated surface General transfer comment: Patient assisting with hand placement and leaning for transfers EOB<>wheelchair     Balance Overall balance assessment: Needs assistance Sitting-balance support: No upper extremity supported, Feet supported, Bilateral upper extremity supported Sitting balance-Leahy Scale: Good Sitting balance - Comments: sitting EOB                                   ADL either performed or assessed with clinical judgement   ADL Overall ADL's : Needs assistance/impaired     Grooming: Wash/dry hands;Wash/dry face;Supervision/safety;Sitting Grooming Details (indicate cue type and reason): EOB     Lower Body Bathing: Maximal assistance;Moderate assistance;Bed level Lower Body Bathing Details (indicate cue type and reason): patient able to bathe peri area in supine Upper Body Dressing : Minimal assistance;Bed level Upper Body Dressing Details (indicate cue type and reason): gown change  Extremity/Trunk Assessment              Vision       Perception     Praxis      Cognition Arousal: Alert Behavior During Therapy: WFL for tasks assessed/performed Overall Cognitive Status: Within Functional Limits for tasks assessed                                 General Comments: required  encouragement to participate due to complaints of nausea        Exercises      Shoulder Instructions       General Comments BP 106/74, HR 93, SpO2 98 on RA    Pertinent Vitals/ Pain       Pain Assessment Pain Assessment: Faces Faces Pain Scale: Hurts little more Pain Location: generalized Pain Descriptors / Indicators: Grimacing Pain Intervention(s): Monitored during session, Repositioned  Home Living                                          Prior Functioning/Environment              Frequency  Min 1X/week        Progress Toward Goals  OT Goals(current goals can now be found in the care plan section)  Progress towards OT goals: Progressing toward goals  Acute Rehab OT Goals Patient Stated Goal: get better OT Goal Formulation: With patient Time For Goal Achievement: 02/20/23 Potential to Achieve Goals: Good ADL Goals Pt Will Perform Grooming: with modified independence;sitting Pt Will Perform Lower Body Dressing: with mod assist;sitting/lateral leans Pt Will Transfer to Toilet: with mod assist;with transfer board;bedside commode Pt/caregiver will Perform Home Exercise Program: Increased strength;Right Upper extremity;Left upper extremity;Both right and left upper extremity;With written HEP provided Additional ADL Goal #1: PT will be S in and OOB for basic ADLs Additional ADL Goal #2: Pt will be able sit EOB unsupported and do 3 grooming activities without LB at CGA level  Plan      Co-evaluation    PT/OT/SLP Co-Evaluation/Treatment: Yes Reason for Co-Treatment: For patient/therapist safety;To address functional/ADL transfers (too nauseated/fatigued to tolerate x2 sessions today) PT goals addressed during session: Mobility/safety with mobility;Balance;Proper use of DME;Strengthening/ROM OT goals addressed during session: ADL's and self-care      AM-PAC OT "6 Clicks" Daily Activity     Outcome Measure   Help from another person  eating meals?: None Help from another person taking care of personal grooming?: A Little Help from another person toileting, which includes using toliet, bedpan, or urinal?: Total Help from another person bathing (including washing, rinsing, drying)?: A Lot Help from another person to put on and taking off regular upper body clothing?: A Little Help from another person to put on and taking off regular lower body clothing?: Total 6 Click Score: 14    End of Session Equipment Utilized During Treatment: Gait belt;Other (comment) (slide board and wheelchair)  OT Visit Diagnosis: Other abnormalities of gait and mobility (R26.89);Muscle weakness (generalized) (M62.81);Pain   Activity Tolerance Patient tolerated treatment well   Patient Left in bed;with call bell/phone within reach;with bed alarm set   Nurse Communication Mobility status        Time: 0865-7846 OT Time Calculation (min): 35 min  Charges: OT General Charges $OT Visit: 1 Visit OT Treatments $Self Care/Home  Management : 8-22 mins  Alfonse Flavors, OTA Acute Rehabilitation Services  Office [phone]   Dewain Penning 02/13/2023, 2:05 PM

## 2023-02-14 DIAGNOSIS — N186 End stage renal disease: Secondary | ICD-10-CM | POA: Diagnosis not present

## 2023-02-14 DIAGNOSIS — I480 Paroxysmal atrial fibrillation: Secondary | ICD-10-CM | POA: Diagnosis not present

## 2023-02-14 DIAGNOSIS — R112 Nausea with vomiting, unspecified: Secondary | ICD-10-CM | POA: Diagnosis not present

## 2023-02-14 DIAGNOSIS — I5020 Unspecified systolic (congestive) heart failure: Secondary | ICD-10-CM | POA: Diagnosis not present

## 2023-02-14 DIAGNOSIS — E1100 Type 2 diabetes mellitus with hyperosmolarity without nonketotic hyperglycemic-hyperosmolar coma (NKHHC): Secondary | ICD-10-CM | POA: Diagnosis not present

## 2023-02-14 LAB — COMPREHENSIVE METABOLIC PANEL
ALT: 20 U/L (ref 0–44)
ALT: 18 U/L (ref 0–44)
AST: 22 U/L (ref 15–41)
AST: 19 U/L (ref 15–41)
Albumin: 3.1 g/dL — ABNORMAL LOW (ref 3.5–5.0)
Albumin: 2.9 g/dL — ABNORMAL LOW (ref 3.5–5.0)
Alkaline Phosphatase: 173 U/L — ABNORMAL HIGH (ref 38–126)
Alkaline Phosphatase: 152 U/L — ABNORMAL HIGH (ref 38–126)
Anion gap: 10 (ref 5–15)
Anion gap: 9 (ref 5–15)
BUN: 9 mg/dL (ref 8–23)
BUN: 9 mg/dL (ref 8–23)
CO2: 27 mmol/L (ref 22–32)
CO2: 27 mmol/L (ref 22–32)
Calcium: 8.4 mg/dL — ABNORMAL LOW (ref 8.9–10.3)
Calcium: 8.6 mg/dL — ABNORMAL LOW (ref 8.9–10.3)
Chloride: 96 mmol/L — ABNORMAL LOW (ref 98–111)
Chloride: 98 mmol/L (ref 98–111)
Creatinine, Ser: 1.86 mg/dL — ABNORMAL HIGH (ref 0.44–1.00)
Creatinine, Ser: 1.98 mg/dL — ABNORMAL HIGH (ref 0.44–1.00)
GFR, Estimated: 28 mL/min — ABNORMAL LOW (ref >60)
GFR, Estimated: 26 mL/min — ABNORMAL LOW (ref >60)
Glucose, Bld: 115 mg/dL — ABNORMAL HIGH (ref 70–99)
Glucose, Bld: 118 mg/dL — ABNORMAL HIGH (ref 70–99)
Potassium: 3.2 mmol/L — ABNORMAL LOW (ref 3.5–5.1)
Potassium: 3.4 mmol/L — ABNORMAL LOW (ref 3.5–5.1)
Sodium: 133 mmol/L — ABNORMAL LOW (ref 135–145)
Sodium: 134 mmol/L — ABNORMAL LOW (ref 135–145)
Total Bilirubin: 0.6 mg/dL (ref 0.3–1.2)
Total Bilirubin: 0.7 mg/dL (ref 0.3–1.2)
Total Protein: 6.4 g/dL — ABNORMAL LOW (ref 6.5–8.1)
Total Protein: 6.2 g/dL — ABNORMAL LOW (ref 6.5–8.1)

## 2023-02-14 LAB — RENAL FUNCTION PANEL
Albumin: 2.8 g/dL — ABNORMAL LOW (ref 3.5–5.0)
Anion gap: 19 — ABNORMAL HIGH (ref 5–15)
BUN: 37 mg/dL — ABNORMAL HIGH (ref 8–23)
CO2: 16 mmol/L — ABNORMAL LOW (ref 22–32)
Calcium: 8.8 mg/dL — ABNORMAL LOW (ref 8.9–10.3)
Chloride: 90 mmol/L — ABNORMAL LOW (ref 98–111)
Creatinine, Ser: 5.16 mg/dL — ABNORMAL HIGH (ref 0.44–1.00)
GFR, Estimated: 8 mL/min — ABNORMAL LOW (ref >60)
Glucose, Bld: 537 mg/dL (ref 70–99)
Phosphorus: 4.5 mg/dL (ref 2.5–4.6)
Potassium: 4.6 mmol/L (ref 3.5–5.1)
Sodium: 125 mmol/L — ABNORMAL LOW (ref 135–145)

## 2023-02-14 LAB — CBC
HCT: 33 % — ABNORMAL LOW (ref 36.0–46.0)
Hemoglobin: 9.7 g/dL — ABNORMAL LOW (ref 12.0–15.0)
MCH: 23.2 pg — ABNORMAL LOW (ref 26.0–34.0)
MCHC: 29.4 g/dL — ABNORMAL LOW (ref 30.0–36.0)
MCV: 78.9 fL — ABNORMAL LOW (ref 80.0–100.0)
Platelets: 191 10*3/uL (ref 150–400)
RBC: 4.18 MIL/uL (ref 3.87–5.11)
RDW: 19.4 % — ABNORMAL HIGH (ref 11.5–15.5)
WBC: 7.1 10*3/uL (ref 4.0–10.5)
nRBC: 0 % (ref 0.0–0.2)

## 2023-02-14 LAB — GLUCOSE, CAPILLARY
Glucose-Capillary: 468 mg/dL — ABNORMAL HIGH (ref 70–99)
Glucose-Capillary: 120 mg/dL — ABNORMAL HIGH (ref 70–99)
Glucose-Capillary: 153 mg/dL — ABNORMAL HIGH (ref 70–99)
Glucose-Capillary: 257 mg/dL — ABNORMAL HIGH (ref 70–99)

## 2023-02-14 LAB — MAGNESIUM: Magnesium: 1.9 mg/dL (ref 1.7–2.4)

## 2023-02-14 LAB — BETA-HYDROXYBUTYRIC ACID: Beta-Hydroxybutyric Acid: 0.76 mmol/L — ABNORMAL HIGH (ref 0.05–0.27)

## 2023-02-14 MED ORDER — HEPARIN SODIUM (PORCINE) 1000 UNIT/ML DIALYSIS: 4000.0000 [IU] | Freq: Once | INTRAMUSCULAR | Status: AC

## 2023-02-14 MED ORDER — LACTATED RINGERS IV SOLN: INTRAVENOUS | Status: DC

## 2023-02-14 MED ORDER — ANTICOAGULANT SODIUM CITRATE 4% (200MG/5ML) IV SOLN
5.0000 mL | Status: DC | PRN
  Filled 2023-02-14: qty 5

## 2023-02-14 MED ORDER — PROCHLORPERAZINE EDISYLATE 10 MG/2ML IJ SOLN
10.0000 mg | Freq: Three times a day (TID) | INTRAMUSCULAR | Status: DC
  Administered 2023-02-14 – 2023-02-17 (×9): 10 mg via INTRAVENOUS
  Filled 2023-02-14 (×9): qty 2

## 2023-02-14 MED ORDER — HEPARIN SODIUM (PORCINE) 1000 UNIT/ML IJ SOLN
INTRAMUSCULAR | Status: AC
  Administered 2023-02-14: 4000 [IU] via INTRAVENOUS_CENTRAL
  Filled 2023-02-14: qty 4

## 2023-02-14 MED ORDER — INSULIN GLARGINE-YFGN 100 UNIT/ML ~~LOC~~ SOLN
16.0000 [IU] | Freq: Every day | SUBCUTANEOUS | Status: DC
  Filled 2023-02-14: qty 0.16

## 2023-02-14 MED ORDER — INSULIN ASPART 100 UNIT/ML IJ SOLN
4.0000 [IU] | Freq: Three times a day (TID) | INTRAMUSCULAR | Status: DC
  Administered 2023-02-14 – 2023-02-17 (×6): 4 [IU] via SUBCUTANEOUS

## 2023-02-14 MED ORDER — METOCLOPRAMIDE HCL 5 MG/ML IJ SOLN: 10.0000 mg | Freq: Three times a day (TID) | INTRAMUSCULAR | Status: DC

## 2023-02-14 MED ORDER — METOCLOPRAMIDE HCL 5 MG/ML IJ SOLN: 5.0000 mg | Freq: Three times a day (TID) | INTRAMUSCULAR | Status: DC

## 2023-02-14 MED ORDER — INSULIN GLARGINE-YFGN 100 UNIT/ML ~~LOC~~ SOLN
18.0000 [IU] | Freq: Every day | SUBCUTANEOUS | Status: DC
  Administered 2023-02-14 – 2023-02-17 (×3): 18 [IU] via SUBCUTANEOUS
  Filled 2023-02-14 (×4): qty 0.18

## 2023-02-14 MED ORDER — ALTEPLASE 2 MG IJ SOLR: 2.0000 mg | Freq: Once | INTRAMUSCULAR | Status: DC | PRN

## 2023-02-14 MED ORDER — HEPARIN SODIUM (PORCINE) 1000 UNIT/ML IJ SOLN
INTRAMUSCULAR | Status: AC
  Administered 2023-02-14: 3800 [IU]
  Filled 2023-02-14: qty 4

## 2023-02-14 MED ORDER — DEXTROSE IN LACTATED RINGERS 5 % IV SOLN: INTRAVENOUS | Status: DC

## 2023-02-14 MED ORDER — CARVEDILOL 3.125 MG PO TABS
3.1250 mg | ORAL_TABLET | Freq: Two times a day (BID) | ORAL | Status: DC
  Administered 2023-02-14 – 2023-02-16 (×3): 3.125 mg via ORAL
  Filled 2023-02-14 (×4): qty 1

## 2023-02-14 MED ORDER — INSULIN ASPART 100 UNIT/ML IJ SOLN: 15.0000 [IU] | Freq: Once | INTRAMUSCULAR | Status: DC

## 2023-02-14 MED ORDER — DEXTROSE 50 % IV SOLN: 0.0000 mL | INTRAVENOUS | Status: DC | PRN

## 2023-02-14 MED ORDER — HEPARIN SODIUM (PORCINE) 1000 UNIT/ML DIALYSIS: 1000.0000 [IU] | INTRAMUSCULAR | Status: DC | PRN

## 2023-02-14 MED ORDER — INSULIN REGULAR(HUMAN) IN NACL 100-0.9 UT/100ML-% IV SOLN: INTRAVENOUS | Status: DC

## 2023-02-14 MED ORDER — DARBEPOETIN ALFA 25 MCG/0.42ML IJ SOSY
25.0000 ug | PREFILLED_SYRINGE | INTRAMUSCULAR | Status: DC
  Administered 2023-02-14: 25 ug via SUBCUTANEOUS
  Filled 2023-02-14: qty 0.42

## 2023-02-14 NOTE — Progress Notes (Addendum)
[facility] Progress Note   Subjective: Seen prior to HD. Denies N,V, abdominal pain.     Objective Vitals:   02/13/23 2000 02/13/23 2346 02/14/23 0400 02/14/23 0827  BP:  (!) 147/51 (!) 144/61 (!) 93/39  Pulse: 90 93 94 82  Resp: 12 17 19 10   Temp: 97.6 F (36.4 C) 97.9 F (36.6 C) 97.7 F (36.5 C)   TempSrc:  Oral Oral   SpO2:  100% 100% 100%  Weight:      Height:       Physical Exam General: Chronically ill appearing female in NAD Heart: S1,S2 RRR No M/RG SR on monitor Lungs: CTAB Abdomen: NABS, NT, ND Extremities: R BKA LLE with edema on LU thigh Dialysis Access: RIJ TDC drsg intact  Additional Objective Labs: Basic Metabolic Panel: Recent Labs  Lab 02/11/23 0409 02/12/23 0648 02/14/23 0702  NA 133* 134* 125*  K 3.4* 3.9 4.6  CL 94* 95* 90*  CO2 27 23 16*  GLUCOSE 157* 150* 537*  BUN 16 25* 37*  CREATININE 3.35* 4.68* 5.16*  CALCIUM 8.8* 9.0 8.8*  PHOS 3.5 4.2 4.5   Liver Function Tests: Recent Labs  Lab 02/11/23 0409 02/12/23 0648 02/14/23 0702  ALBUMIN 2.8* 2.9* 2.8*   No results for input(s): "LIPASE", "AMYLASE" in the last 168 hours. CBC: Recent Labs  Lab 02/10/23 1002 02/11/23 0409 02/12/23 0652 02/14/23 0702  WBC 6.1 6.2 6.4 7.1  HGB 10.3* 11.2* 11.3* 9.7*  HCT 34.8* 37.7 38.3 33.0*  MCV 76.5* 77.1* 77.8* 78.9*  PLT 198 199 211 191   Blood Culture    Component Value Date/Time   SDES TISSUE 11/14/2022 0919   SPECREQUEST NONE 11/14/2022 0919   CULT  11/14/2022 0919    FEW PROTEUS MIRABILIS RARE KLEBSIELLA PNEUMONIAE NO ANAEROBES ISOLATED FEW ENTEROCOCCUS FAECALIS    REPTSTATUS 11/19/2022 FINAL 11/14/2022 0919    Cardiac Enzymes: No results for input(s): "CKTOTAL", "CKMB", "CKMBINDEX", "TROPONINI" in the last 168 hours. CBG: Recent Labs  Lab 02/13/23 0812 02/13/23 1234 02/13/23 1606 02/13/23 2106 02/14/23 0728  GLUCAP 376* 335* 391* 345* 468*   Iron Studies: No results for input(s): "IRON", "TIBC",  "TRANSFERRIN", "FERRITIN" in the last 72 hours. @lablastinr3 @ Studies/Results: No results found. Medications:  anticoagulant sodium citrate      (feeding supplement) PROSource Plus  30 mL Oral BID BM   amiodarone  200 mg Oral Daily   apixaban  5 mg Oral BID   atorvastatin  80 mg Oral Daily   calcitRIOL  0.75 mcg Oral Q T,Th,Sa-HD   carvedilol  6.25 mg Oral BID WC   Chlorhexidine Gluconate Cloth  6 each Topical Q0600   cholestyramine  4 g Oral TID   clonazePAM  0.5 mg Oral BID   famotidine  20 mg Oral Daily   ferric citrate  210 mg Oral TID WC   insulin aspart  0-6 Units Subcutaneous TID WC   insulin aspart  15 Units Subcutaneous Once   insulin aspart  4 Units Subcutaneous TID WC   insulin glargine-yfgn  16 Units Subcutaneous QHS   levothyroxine  75 mcg Oral Q0600   metoCLOPramide (REGLAN) injection  5 mg Intravenous TID AC   mirtazapine  7.5 mg Oral QHS   pantoprazole  40 mg Oral BID   polyethylene glycol  17 g Oral Daily   sodium chloride flush  10-40 mL Intracatheter Q12H   venlafaxine XR  75 mg Oral Q breakfast     Dialysis Orders: TTS [facility]  4hr, 400/800, EDW 79kg, 2K/2Ca bath, TDC,  - Heparin 4000 units IV initial bolus + 2000 units IV midrun - Aranesp given 9/3 during recent admit - Calcitriol 0.68mcg PO q HD - Binder recently changed from renvela to [place] Just discharged 02/02/2023   Assessment/Plan: 1. Intractable nausea/vomiting: Just discharged with same, readmitted 9/12. ?Felt due to colitis and/or gastroparesis, uncontrolled BS on admit. S/p 3 doses of Reglan. Seen by GI went for upper endo. Normal esophagus, no intervention. Per primary. .  2. ESRD: Usual TTS schedule -Next HD 02/17/2023 3. HTN/volume: Edema in hips which is somewhat her norm. Na 125. Increase UF with HD as tolerated. Use midodrine and albumin if needed for BP support. Decrease carvedilol to 3.25 mg PO BID.  4. Anemia: Hgb HGB 9.7. Give Aranesp 25 mcg SQ today.  5. Secondary  hyperparathyroidism: CorrCa ok, Phos low. Reducing Auryxia to 1/meals. 6. Nutrition: Alb low, adding supps. 7. T2DM: BS > 600 on admit - better with insulin. 8. CAD 9. pAF: On Eliquis  Disposition: Stable from renal prospective for discharge.   Susan [name] [place] NP-C 02/14/2023, 8:56 AM  [employer] [phone]

## 2023-02-14 NOTE — Procedures (Signed)
HD Note:  Some information was entered later than the data was gathered due to patient care needs. The stated time with the data is accurate.  Received patient in bed to unit.   Alert and oriented.   Informed consent signed and in chart.   Access used: Upper right chest HD catheter Access issues: None  Patient BP was below desired limits at times during treatment resulting in the UF being turned off until the BP recovered.  See flowsheet for details.  TX duration: 4 hours  Alert, without acute distress.  Total UF removed: 2000 ml  Hand-off given to patient's nurse.   Transported back to the room   Keiona Jenison L. Dareen Piano, RN [facility].

## 2023-02-14 NOTE — Plan of Care (Signed)
  Problem: Education: Goal: Knowledge of the prescribed therapeutic regimen will improve Outcome: Progressing Goal: Ability to verbalize activity precautions or restrictions will improve Outcome: Progressing   Problem: Activity: Goal: Ability to perform//tolerate increased activity and mobilize with assistive devices will improve Outcome: Progressing   Problem: Clinical Measurements: Goal: Postoperative complications will be avoided or minimized Outcome: Progressing   Problem: Self-Care: Goal: Ability to meet self-care needs will improve Outcome: Progressing   Problem: Self-Concept: Goal: Ability to maintain and perform role responsibilities to the fullest extent possible will improve Outcome: Progressing   Problem: Pain Management: Goal: Pain level will decrease with appropriate interventions Outcome: Progressing   Problem: Education: Goal: Individualized Educational Video(s) Outcome: Progressing   Problem: Activity: Goal: Ability to return to baseline activity level will improve Outcome: Progressing   Problem: Cardiac: Goal: Ability to maintain an adequate cardiac output will improve Outcome: Progressing   Problem: Health Behavior/Discharge Planning: Goal: Ability to manage health-related needs will improve Outcome: Progressing

## 2023-02-14 NOTE — Progress Notes (Addendum)
  GI Progress Note Covering for Drs. Mann & Hung   Assessment    Persistent nausea, vomiting - suspected gastroparesis d/t hyperglycemia DM-2 with hyperosmolar hyperglycemia ESRD CAD HFrEF PAD S/P right BKA Prolonged QTc   Recommendations   DC metoclopramide given prolonged QTc  Start compazine 10 mg IV tid ac  Appropriate glucose control should help nausea, vomiting   Chief Complaint   Persistent nausea, vomiting - unchanged. EGD unremarkable except bilious gastric fluid  Vital signs in last 24 hours: Temp:  [97.5 F (36.4 C)-97.9 F (36.6 C)] 97.5 F (36.4 C) (09/21 1236) Pulse Rate:  [73-94] 74 (09/21 1236) Resp:  [7-19] 15 (09/21 1236) BP: (85-147)/(27-91) 116/54 (09/21 1236) SpO2:  [99 %-100 %] 100 % (09/21 1236) Weight:  [78 kg] 78 kg (09/21 1236) Last BM Date : 02/09/23  General: Alert, well-developed, in NAD Heart:  Regular rate and rhythm; no murmurs Chest: Clear to ascultation bilaterally Abdomen:  Soft, nontender and nondistended. Normal bowel sounds, without guarding, and without rebound.   Extremities:  Without edema. Neurologic:  Alert and  oriented x4; grossly normal neurologically. Psych:  Alert and cooperative. Normal mood and affect.  Intake/Output from previous day: 09/20 0701 - 09/21 0700 In: -  Out: 100 [Emesis/NG output:100] Intake/Output this shift: Total I/O In: -  Out: 96 [Other:96]  Lab Results: Recent Labs    02/12/23 0652 02/14/23 0702  WBC 6.4 7.1  HGB 11.3* 9.7*  HCT 38.3 33.0*  PLT 211 191   BMET Recent Labs    02/12/23 0648 02/14/23 0702  NA 134* 125*  K 3.9 4.6  CL 95* 90*  CO2 23 16*  GLUCOSE 150* 537*  BUN 25* 37*  CREATININE 4.68* 5.16*  CALCIUM 9.0 8.8*   LFT Recent Labs    02/14/23 0702  ALBUMIN 2.8*     LOS: 9 days   Susan Hallmon T. Russella Dar, MD 02/14/2023, 1:10 PM See Loretha Stapler, [facility], to contact our on call provider

## 2023-02-14 NOTE — Progress Notes (Addendum)
PROGRESS NOTE        PATIENT DETAILS Name: Susan [name] Age: 72 y.o. Sex: female Date of Birth: [birth year] Admit Date: 02/05/2023 Admitting Physician Alan Mulder, MD ZOX:[id number], Priscille Heidelberg, MD  Brief Summary: Patient is a 72 y.o.  female with history of ESRD on HD TTS, DM-2, PAF, CAD, HFrEF, HTN, PAD s/p right BKA-presented with nausea/vomiting in the setting of hyperosmolar hyperglycemic state.  Hospital course complicated by intractable nausea and vomiting-see below for further details.  Significant events: 9/12>> admit to [facility]  Significant studies: 9/14>> CT abdomen: No SBO  Significant microbiology data: None  Procedures: 9/19>> EGD: Normal esophagus, bilious gastric fluid  Consults: Nephrology  Subjective: Vomited x 1 last night-nauseous this morning.  Last BM was yesterday.  Objective: Vitals: Blood pressure (!) 116/54, pulse 74, temperature (!) 97.5 F (36.4 C), resp. rate 15, height 5\' 5"  (1.651 m), weight 78 kg, SpO2 100%.   Exam: Gen Exam:Alert awake-not in any distress HEENT:atraumatic, normocephalic Chest: B/L clear to auscultation anteriorly CVS:S1S2 regular Abdomen:soft non tender, non distended Extremities:no edema-right BKA Neurology: Non focal Skin: no rash  Pertinent Labs/Radiology:    Latest Ref Rng & Units 02/14/2023    7:02 AM 02/12/2023    6:52 AM 02/11/2023    4:09 AM  CBC  WBC 4.0 - 10.5 K/uL 7.1  6.4  6.2   Hemoglobin 12.0 - 15.0 g/dL 9.7  09.8  11.9   Hematocrit 36.0 - 46.0 % 33.0  38.3  37.7   Platelets 150 - 400 K/uL 191  211  199     Lab Results  Component Value Date   NA 133 (L) 02/14/2023   K 3.2 (L) 02/14/2023   CL 96 (L) 02/14/2023   CO2 27 02/14/2023      Assessment/Plan: Hyperosmolar hyperglycemic state Required insulin infusion-has been transitioned to SQ insulin-however CBGs significantly elevated this morning-has developed anion gap metabolic acidosis-likely in DKA-will start insulin  infusion.  DM-2 with uncontrolled hyperglycemia (A1c 7.9 on 4/3) with diabetic ketoacidosis Has had issues with hypoglycemia-since oral intake is erratic with vomiting-amount of permissive hyperglycemia was allowed-unfortunately overnight-CBGs significantly elevated-appears to be in DKA this morning.  Starting insulin infusion.    Addendum: Repeat labs not consistent w DKA-hold insulin gtt-continue Semglee-dosage increase to 18 units, increase pre-meal novolog to 4 units. Sugars hard to control 2/2 to erratic absorption from gastroparesis  Recent Labs    02/13/23 2106 02/14/23 0728 02/14/23 1316  GLUCAP 345* 468* 120*      Nausea/vomiting likely due to gastroparesis flare Unfortunately continues to have intermittent nausea and vomiting-in spite of being on scheduled antiemetics. CT abdomen on 9/14 negative for SBO Since intermittent/intractable nausea/vomiting continued-GI was consulted on 9/19-endoscopy with no major findings-apart from bilious gastric secretions Has a wide QRS and prolonged QT as baseline-this limits a lot of treatment options Will restart Reglan x 3 doses again today On PPI/Pepcid On Questran-to see if this helps with bile reflux Small portion meals again reemphasized GI following.  Asymptomatic bacteriuria UA dirty but no symptoms-makes very little urine No longer on Cefepime-UTI ruled out.  Chronic HFrEF Volume removal with HD  CAD No anginal symptoms Blocker/statin Suspect not on ASA due to being on Eliquis.  PAF Telemetry monitoring Coreg/amiodarone Eliquis briefly held for EGD but has since been resumed.   ESRD on HD TTS Nephrology following  Hypokalemia Repleted  Hyponatremia Due to excess volume-worse today due to hyperglycemia (pseudohyponatremia) Should improve with improvement in sugar/further dialysis.  Normocytic anemia Secondary to CKD Follow CBC and transfuse if significant drop  PAD-s/p right  BKA  Depression/anxiety Relatively stable Effexor/Remeron/Klonopin  Hypothyroidism Synthroid TSH on 8/22 stable  Debility/deconditioning PT/OT eval-Home health recommended  Chronic venous ulcer-left lateral/posterior leg Present prior to admission Follows with Dr. Lajoyce Corners Continue dressing changes per wound care team.  BMI: Estimated body mass index is 28.62 kg/m as calculated from the following:   Height as of this encounter: 5\' 5"  (1.651 m).   Weight as of this encounter: 78 kg.   Code status:   Code Status: Full Code   DVT Prophylaxis: SCDs Start: 02/05/23 1719 apixaban (ELIQUIS) tablet 5 mg    Family Communication: None at bedside   Disposition Plan: Status is: Inpatient Remains inpatient appropriate because:    Planned Discharge Destination:Home   Diet: Diet Order             Diet full liquid Fluid consistency: Thin  Diet effective now                     Antimicrobial agents: Anti-infectives (From admission, onward)    Start     Dose/Rate Route Frequency Ordered Stop   02/10/23 1100  fluconazole (DIFLUCAN) IVPB 200 mg  Status:  Discontinued        200 mg 100 mL/hr over 60 Minutes Intravenous Every 24 hours 02/10/23 1045 02/12/23 1714   02/06/23 1600  ceFEPIme (MAXIPIME) 1 g in sodium chloride 0.9 % 100 mL IVPB  Status:  Discontinued        1 g 200 mL/hr over 30 Minutes Intravenous Every 24 hours 02/05/23 2044 02/06/23 0935   02/05/23 2130  ceFEPIme (MAXIPIME) 1 g in sodium chloride 0.9 % 100 mL IVPB  Status:  Discontinued        1 g 200 mL/hr over 30 Minutes Intravenous Every 24 hours 02/05/23 2041 02/05/23 2044   02/05/23 1630  ceFEPIme (MAXIPIME) 2 g in sodium chloride 0.9 % 100 mL IVPB        2 g 200 mL/hr over 30 Minutes Intravenous  Once 02/05/23 1625 02/05/23 1737        MEDICATIONS: Scheduled Meds:  (feeding supplement) PROSource Plus  30 mL Oral BID BM   amiodarone  200 mg Oral Daily   apixaban  5 mg Oral BID   atorvastatin   80 mg Oral Daily   calcitRIOL  0.75 mcg Oral Q T,Th,Sa-HD   carvedilol  3.125 mg Oral BID WC   Chlorhexidine Gluconate Cloth  6 each Topical Q0600   cholestyramine  4 g Oral TID   clonazePAM  0.5 mg Oral BID   darbepoetin (ARANESP) injection - DIALYSIS  25 mcg Subcutaneous Q Sat-1800   famotidine  20 mg Oral Daily   ferric citrate  210 mg Oral TID WC   insulin aspart  0-6 Units Subcutaneous TID WC   insulin aspart  15 Units Subcutaneous Once   insulin aspart  4 Units Subcutaneous TID WC   insulin glargine-yfgn  16 Units Subcutaneous QHS   levothyroxine  75 mcg Oral Q0600   mirtazapine  7.5 mg Oral QHS   pantoprazole  40 mg Oral BID   polyethylene glycol  17 g Oral Daily   prochlorperazine  10 mg Intravenous TID WC   sodium chloride flush  10-40 mL Intracatheter Q12H   venlafaxine XR  75 mg Oral Q breakfast   Continuous Infusions:     PRN Meds:.acetaminophen **OR** acetaminophen, bisacodyl, dextrose, dextrose, methocarbamol, midodrine, sodium chloride flush, trimethobenzamide   I have personally reviewed following labs and imaging studies  LABORATORY DATA: CBC: Recent Labs  Lab 02/10/23 1002 02/11/23 0409 02/12/23 0652 02/14/23 0702  WBC 6.1 6.2 6.4 7.1  HGB 10.3* 11.2* 11.3* 9.7*  HCT 34.8* 37.7 38.3 33.0*  MCV 76.5* 77.1* 77.8* 78.9*  PLT 198 199 211 191    Basic Metabolic Panel: Recent Labs  Lab 02/08/23 1020 02/10/23 1003 02/11/23 0409 02/12/23 0648 02/12/23 0652 02/14/23 0702 02/14/23 1338  NA 133* 127* 133* 134*  --  125* 133*  K 3.7 3.3* 3.4* 3.9  --  4.6 3.2*  CL 96* 93* 94* 95*  --  90* 96*  CO2 27 26 27 23   --  16* 27  GLUCOSE 171* 326* 157* 150*  --  537* 115*  BUN 16 35* 16 25*  --  37* 9  CREATININE 2.84* 5.07* 3.35* 4.68*  --  5.16* 1.86*  CALCIUM 9.2 8.2* 8.8* 9.0  --  8.8* 8.4*  MG 2.5*  --  1.9  --  2.3 1.9  --   PHOS 4.0 4.9* 3.5 4.2  --  4.5  --     GFR: Estimated Creatinine Clearance: 27.8 mL/min (A) (by C-G formula based on SCr  of 1.86 mg/dL (H)).  Liver Function Tests: Recent Labs  Lab 02/10/23 1003 02/11/23 0409 02/12/23 0648 02/14/23 0702 02/14/23 1338  AST  --   --   --   --  22  ALT  --   --   --   --  20  ALKPHOS  --   --   --   --  173*  BILITOT  --   --   --   --  0.6  PROT  --   --   --   --  6.4*  ALBUMIN 2.6* 2.8* 2.9* 2.8* 3.1*   No results for input(s): "LIPASE", "AMYLASE" in the last 168 hours.  No results for input(s): "AMMONIA" in the last 168 hours.  Coagulation Profile: No results for input(s): "INR", "PROTIME" in the last 168 hours.  Cardiac Enzymes: No results for input(s): "CKTOTAL", "CKMB", "CKMBINDEX", "TROPONINI" in the last 168 hours.  BNP (last 3 results) No results for input(s): "PROBNP" in the last 8760 hours.  Lipid Profile: No results for input(s): "CHOL", "HDL", "LDLCALC", "TRIG", "CHOLHDL", "LDLDIRECT" in the last 72 hours.  Thyroid Function Tests: No results for input(s): "TSH", "T4TOTAL", "FREET4", "T3FREE", "THYROIDAB" in the last 72 hours.  Anemia Panel: No results for input(s): "VITAMINB12", "FOLATE", "FERRITIN", "TIBC", "IRON", "RETICCTPCT" in the last 72 hours.  Urine analysis:    Component Value Date/Time   COLORURINE AMBER (A) 02/05/2023 1418   APPEARANCEUR TURBID (A) 02/05/2023 1418   LABSPEC 1.015 02/05/2023 1418   PHURINE 6.0 02/05/2023 1418   GLUCOSEU >=500 (A) 02/05/2023 1418   HGBUR MODERATE (A) 02/05/2023 1418   BILIRUBINUR NEGATIVE 02/05/2023 1418   BILIRUBINUR negative 06/05/2020 1334   KETONESUR 5 (A) 02/05/2023 1418   PROTEINUR 100 (A) 02/05/2023 1418   UROBILINOGEN 0.2 06/05/2020 1334   UROBILINOGEN 0.2 04/01/2014 1659   NITRITE NEGATIVE 02/05/2023 1418   LEUKOCYTESUR LARGE (A) 02/05/2023 1418    Sepsis Labs: Lactic Acid, Venous    Component Value Date/Time   LATICACIDVEN 0.9 11/20/2022 0116    MICROBIOLOGY: No results found for this or any previous visit (  from the past 240 hour(s)).  RADIOLOGY STUDIES/RESULTS: No  results found.   LOS: 9 days   Jeoffrey Massed, MD  [facility]    To contact the attending provider between 7A-7P or the covering provider during after hours 7P-7A, please log into the web site [email or web address] and access using universal [place] password for that web site. If you do not have the password, please call the hospital operator.  02/14/2023, 2:50 PM

## 2023-02-15 ENCOUNTER — Encounter (HOSPITAL_COMMUNITY): Payer: Self-pay | Admitting: Gastroenterology

## 2023-02-15 DIAGNOSIS — E1100 Type 2 diabetes mellitus with hyperosmolarity without nonketotic hyperglycemic-hyperosmolar coma (NKHHC): Secondary | ICD-10-CM | POA: Diagnosis not present

## 2023-02-15 DIAGNOSIS — R112 Nausea with vomiting, unspecified: Secondary | ICD-10-CM | POA: Diagnosis not present

## 2023-02-15 LAB — RENAL FUNCTION PANEL
Albumin: 2.8 g/dL — ABNORMAL LOW (ref 3.5–5.0)
Anion gap: 8 (ref 5–15)
BUN: 16 mg/dL (ref 8–23)
CO2: 25 mmol/L (ref 22–32)
Calcium: 8.9 mg/dL (ref 8.9–10.3)
Chloride: 98 mmol/L (ref 98–111)
Creatinine, Ser: 3.56 mg/dL — ABNORMAL HIGH (ref 0.44–1.00)
GFR, Estimated: 13 mL/min — ABNORMAL LOW (ref >60)
Glucose, Bld: 263 mg/dL — ABNORMAL HIGH (ref 70–99)
Phosphorus: 3.3 mg/dL (ref 2.5–4.6)
Potassium: 3.5 mmol/L (ref 3.5–5.1)
Sodium: 131 mmol/L — ABNORMAL LOW (ref 135–145)

## 2023-02-15 LAB — CBC
HCT: 35.7 % — ABNORMAL LOW (ref 36.0–46.0)
Hemoglobin: 10.8 g/dL — ABNORMAL LOW (ref 12.0–15.0)
MCH: 23.6 pg — ABNORMAL LOW (ref 26.0–34.0)
MCHC: 30.3 g/dL (ref 30.0–36.0)
MCV: 77.9 fL — ABNORMAL LOW (ref 80.0–100.0)
Platelets: 196 10*3/uL (ref 150–400)
RBC: 4.58 MIL/uL (ref 3.87–5.11)
RDW: 19.9 % — ABNORMAL HIGH (ref 11.5–15.5)
WBC: 6.7 10*3/uL (ref 4.0–10.5)
nRBC: 0 % (ref 0.0–0.2)

## 2023-02-15 LAB — GLUCOSE, CAPILLARY
Glucose-Capillary: 234 mg/dL — ABNORMAL HIGH (ref 70–99)
Glucose-Capillary: 236 mg/dL — ABNORMAL HIGH (ref 70–99)
Glucose-Capillary: 83 mg/dL (ref 70–99)
Glucose-Capillary: 113 mg/dL — ABNORMAL HIGH (ref 70–99)

## 2023-02-15 LAB — MAGNESIUM: Magnesium: 1.6 mg/dL — ABNORMAL LOW (ref 1.7–2.4)

## 2023-02-15 MED ORDER — MAGNESIUM SULFATE 4 GM/100ML IV SOLN
4.0000 g | Freq: Once | INTRAVENOUS | Status: AC
  Administered 2023-02-15: 4 g via INTRAVENOUS
  Filled 2023-02-15: qty 100

## 2023-02-15 NOTE — Progress Notes (Addendum)
Patient ID: Susan [name], female   DOB: [birth year], 72 y.o.   MRN: [id number] GI Progress Note Covering for Drs. Mann & Hung   Subjective  Day #9 CC: Persistent nausea, vomiting in setting of ESRD and IDDM  Glucose 234  Patient was resting, arouses easily says she is still quite nauseated and has not been able to eat, trying to sip on liquids but has not tried anything this morning    Objective   Vital signs in last 24 hours: Temp:  [97.5 F (36.4 C)-98.5 F (36.9 C)] 98.4 F (36.9 C) (09/22 0404) Pulse Rate:  [63-76] 63 (09/22 0404) Resp:  [7-20] 17 (09/22 0404) BP: (85-132)/(27-91) 131/59 (09/22 0404) SpO2:  [98 %-100 %] 99 % (09/22 0404) Weight:  [78 kg] 78 kg (09/21 1236) Last BM Date : 02/13/23 General:   Older white female in NAD Heart:  Regular rate and rhythm; no murmurs Lungs: Respirations even and unlabored, lungs CTA bilaterally Abdomen:  Soft, nontender and nondistended.  Bowel sounds are present Extremities:  Without edema. Neurologic:  Alert and oriented,  grossly normal neurologically. Psych:  Cooperative. Normal mood and affect.  Intake/Output from previous day: 09/21 0701 - 09/22 0700 In: 120 [P.O.:120] Out: 296 [Urine:200] Intake/Output this shift: No intake/output data recorded.  Lab Results: Recent Labs    02/14/23 0702  WBC 7.1  HGB 9.7*  HCT 33.0*  PLT 191   BMET Recent Labs    02/14/23 0702 02/14/23 1338 02/14/23 1446  NA 125* 133* 134*  K 4.6 3.2* 3.4*  CL 90* 96* 98  CO2 16* 27 27  GLUCOSE 537* 115* 118*  BUN 37* 9 9  CREATININE 5.16* 1.86* 1.98*  CALCIUM 8.8* 8.4* 8.6*   LFT Recent Labs    02/14/23 1446  PROT 6.2*  ALBUMIN 2.9*  AST 19  ALT 18  ALKPHOS 152*  BILITOT 0.7      Assessment / Plan:    #18 72 year old white female with insulin-dependent diabetes mellitus admitted with hyperosmolar hyperglycemic state with persistent nausea and vomiting  EGD-per Dr. Elnoria Howard on 02/13/2023 with bilious fluid in the stomach  and concern for bile gastritis  Suspect her symptoms are secondary to gastroparesis in setting of poorly controlled diabetes  She has prolonged QT, limiting medication choices  Started on IV Compazine yesterday Little improvement as yet, not taking anything p.o.  #2  End-stage renal disease on dialysis #3  Ccoronary artery disease #4  Congestive heart failure with reduced EF #5  Peripheral arterial disease status post right BKA  Plan: Discontinue metoclopramide yesterday and started Compazine 10 mg IV AC Would continue IV Compazine around-the-clock until she is able to consistently keep down p.o.'s Advance diet as she tolerates Continue twice daily Protonix Decreased Questran to once daily, though not certain she will need to stay on this Dr. Elnoria Howard will resume her GI care tomorrow   LOS: 10 days   Amy EsterwoodPA-C  02/15/2023, 8:51 AM   Attending Physician Note   I have taken an interval history, reviewed the chart and examined the patient. I performed a substantive portion of this encounter, including complete performance of at least one of the key components, in conjunction with the APP. I agree with the APP's note, impression and recommendations with my edits. Dr. Elnoria Howard will resume GI care tomorrow.   Claudette Head, MD [facility] See AMION, [facility], for our on call provider

## 2023-02-15 NOTE — Progress Notes (Addendum)
[facility] Progress Note   Subjective: C/O nausea and inability to eat. BS > 200. Discharge canceled. Seen in room with lab/IV therapy present drawing labs. Eyes closed, not talking very much. No specific complaints.      Objective Vitals:   02/14/23 2239 02/15/23 0037 02/15/23 0404 02/15/23 0853  BP: (!) 114/35 (!) 116/40 (!) 131/59 (!) 126/40  Pulse: 74 64 63 66  Resp: 20 16 17 11   Temp:  98 F (36.7 C) 98.4 F (36.9 C) (!) 97.2 F (36.2 C)  TempSrc:  Oral Oral Oral  SpO2: 99% 100% 99% 100%  Weight:      Height:       Physical Exam General: Chronically ill appearing female in NAD Heart: S1,S2 RRR No M/RG SR on monitor Lungs: CTAB Abdomen: NABS, NT, ND Extremities: R BKA LLE with edema on LU thigh Dialysis Access: RIJ [facility] drsg intact   Additional Objective Labs: Basic Metabolic Panel: Recent Labs  Lab 02/11/23 0409 02/12/23 0648 02/14/23 0702 02/14/23 1338 02/14/23 1446  NA 133* 134* 125* 133* 134*  K 3.4* 3.9 4.6 3.2* 3.4*  CL 94* 95* 90* 96* 98  CO2 27 23 16* 27 27  GLUCOSE 157* 150* 537* 115* 118*  BUN 16 25* 37* 9 9  CREATININE 3.35* 4.68* 5.16* 1.86* 1.98*  CALCIUM 8.8* 9.0 8.8* 8.4* 8.6*  PHOS 3.5 4.2 4.5  --   --    Liver Function Tests: Recent Labs  Lab 02/14/23 0702 02/14/23 1338 02/14/23 1446  AST  --  22 19  ALT  --  20 18  ALKPHOS  --  173* 152*  BILITOT  --  0.6 0.7  PROT  --  6.4* 6.2*  ALBUMIN 2.8* 3.1* 2.9*   No results for input(s): "LIPASE", "AMYLASE" in the last 168 hours. CBC: Recent Labs  Lab 02/10/23 1002 02/11/23 0409 02/12/23 0652 02/14/23 0702  WBC 6.1 6.2 6.4 7.1  HGB 10.3* 11.2* 11.3* 9.7*  HCT 34.8* 37.7 38.3 33.0*  MCV 76.5* 77.1* 77.8* 78.9*  PLT 198 199 211 191   Blood Culture    Component Value Date/Time   SDES TISSUE 11/14/2022 0919   SPECREQUEST NONE 11/14/2022 0919   CULT  11/14/2022 0919    FEW PROTEUS MIRABILIS RARE KLEBSIELLA PNEUMONIAE NO ANAEROBES ISOLATED FEW ENTEROCOCCUS  FAECALIS    REPTSTATUS 11/19/2022 FINAL 11/14/2022 0919    Cardiac Enzymes: No results for input(s): "CKTOTAL", "CKMB", "CKMBINDEX", "TROPONINI" in the last 168 hours. CBG: Recent Labs  Lab 02/14/23 0728 02/14/23 1316 02/14/23 1636 02/14/23 2219 02/15/23 0850  GLUCAP 468* 120* 153* 257* 234*   Iron Studies: No results for input(s): "IRON", "TIBC", "TRANSFERRIN", "FERRITIN" in the last 72 hours. @lablastinr3 @ Studies/Results: No results found. Medications:   (feeding supplement) PROSource Plus  30 mL Oral BID BM   amiodarone  200 mg Oral Daily   apixaban  5 mg Oral BID   atorvastatin  80 mg Oral Daily   calcitRIOL  0.75 mcg Oral Q T,Th,Sa-HD   carvedilol  3.125 mg Oral BID WC   Chlorhexidine Gluconate Cloth  6 each Topical Q0600   cholestyramine  4 g Oral TID   clonazePAM  0.5 mg Oral BID   darbepoetin (ARANESP) injection - DIALYSIS  25 mcg Subcutaneous Q Sat-1800   famotidine  20 mg Oral Daily   ferric citrate  210 mg Oral TID WC   insulin aspart  0-6 Units Subcutaneous TID WC   insulin aspart  4 Units Subcutaneous  TID WC   insulin glargine-yfgn  18 Units Subcutaneous QHS   levothyroxine  75 mcg Oral Q0600   mirtazapine  7.5 mg Oral QHS   pantoprazole  40 mg Oral BID   polyethylene glycol  17 g Oral Daily   prochlorperazine  10 mg Intravenous TID WC   sodium chloride flush  10-40 mL Intracatheter Q12H   venlafaxine XR  75 mg Oral Q breakfast     Dialysis Orders: TTS [facility] 4hr, 400/800, EDW 79kg, 2K/2Ca bath, TDC,  - Heparin 4000 units IV initial bolus + 2000 units IV midrun - Aranesp given 9/3 during recent admit - Calcitriol 0.47mcg PO q HD - Binder recently changed from renvela to [place] Just discharged 02/02/2023   Assessment/Plan: 1. Intractable nausea/vomiting: Just discharged with same, readmitted 9/12. ?Felt due to colitis and/or gastroparesis, uncontrolled BS on admit. S/p 3 doses of Reglan. Seen by GI went for upper endo. Normal esophagus, no  intervention. Per primary. .  2. ESRD: Usual TTS schedule -Next HD 02/17/2023 3. HTN/volume: Edema in hips which is somewhat her norm. Na 134. Increase UF with HD as tolerated. Use midodrine and albumin if needed for BP support. Decrease carvedilol to 3.25 mg PO BID.  4. Anemia: Hgb HGB 9.7. Give Aranesp 25 mcg SQ today.  5. Secondary hyperparathyroidism: CorrCa ok, Phos low. Reducing Auryxia to 1/meals. 6. Nutrition: Alb low, adding supps. 7. T2DM: BS > 600 on admit - better with insulin. 8. CAD 9. pAF: On Eliquis  Delorese Sellin H. Judie Hollick NP-C 02/15/2023, 10:45 AM  [employer] [phone]

## 2023-02-15 NOTE — Inpatient Diabetes Management (Signed)
Inpatient Diabetes Program Recommendations  AACE/ADA: New Consensus Statement on Inpatient Glycemic Control (2015)  Target Ranges:  Prepandial:   less than 140 mg/dL      Peak postprandial:   less than 180 mg/dL (1-2 hours)      Critically ill patients:  140 - 180 mg/dL   Lab Results  Component Value Date   GLUCAP 234 (H) 02/15/2023   HGBA1C 7.9 (H) 08/27/2022    Review of Glycemic Control  Latest Reference Range & Units 02/14/23 07:28 02/14/23 13:16 02/14/23 16:36 02/14/23 22:19 02/15/23 08:50  Glucose-Capillary 70 - 99 mg/dL 732 (H) 202 (H) 542 (H) 257 (H) 234 (H)   Diabetes history: DM 2 Outpatient Diabetes medications:  Current orders for Inpatient glycemic control:  Semglee 18 Daily Novolog 0-6 units Novolog 4 units tid meal coverage  Inpatient Diabetes Program Recommendations:    -  Pt may benefit from Novolog bedtime coverage, it may also aid in lowering the fasting glucose  Thanks, Christena Deem RN, MSN, BC-ADM Inpatient Diabetes Coordinator Team Pager [phone] (8a-5p)

## 2023-02-15 NOTE — Progress Notes (Signed)
PROGRESS NOTE        PATIENT DETAILS Name: Susan [name] Age: 72 y.o. Sex: female Date of Birth: [birth year] Admit Date: 02/05/2023 Admitting Physician Alan Mulder, MD [id number], Priscille Heidelberg, MD  Brief Summary: Patient is a 72 y.o.  female with history of ESRD on HD TTS, DM-2, PAF, CAD, HFrEF, HTN, PAD s/p right BKA-presented with nausea/vomiting in the setting of hyperosmolar hyperglycemic state.  Hospital course complicated by intractable nausea and vomiting-see below for further details.  Significant events: 9/12>> admit to [facility]  Significant studies: 9/14>> CT abdomen: No SBO  Significant microbiology data: None  Procedures: 9/19>> EGD: Normal esophagus, bilious gastric fluid  Consults: Nephrology  Subjective: Patient in bed, appears comfortable, denies any headache, no fever, no chest pain or pressure, no shortness of breath , no abdominal pain continues to have nausea. No focal weakness.  Objective: Vitals: Blood pressure (!) 126/40, pulse 66, temperature (!) 97.2 F (36.2 C), temperature source Oral, resp. rate 11, height 5\' 5"  (1.651 m), weight 78 kg, SpO2 100%.   Exam:  Awake Alert, No new F.N deficits, Normal affect .AT,PERRAL Supple Neck, No JVD,   Symmetrical Chest wall movement, Good air movement bilaterally, CTAB RRR,No Gallops, Rubs or new Murmurs,  +ve B.Sounds, Abd Soft, No tenderness,   No Cyanosis, Clubbing or edema    Assessment/Plan: DM type II with initial hyperosmolar hyperglycemic state thereafter brief DKA. Insulin dose adjusted DKA resolved continue to monitor CBGs closely, avoiding increasing insulin as oral intake is extremely inconsistent due to underlying nausea.  Recent Labs    02/14/23 1636 02/14/23 2219 02/15/23 0850  GLUCAP 153* 257* 234*      Nausea/vomiting likely due to gastroparesis flare Unfortunately continues to have intermittent nausea and vomiting-in spite of being on scheduled  antiemetics. CT abdomen on 9/14 negative for SBO Since intermittent/intractable nausea/vomiting continued-GI was consulted on 9/19-endoscopy with no major findings-apart from bilious gastric secretions Has a wide QRS and prolonged QT as baseline-this limits a lot of treatment options GI is following the patient,  Reglan stopped by GI on 02/14/2023 currently on scheduled Compazine per GI along with cholestyramine at a reduced dose.  Patient is still nauseated defer further management of this issue to the GI team.  Asymptomatic bacteriuria UA dirty but no symptoms-makes very little urine No longer on Cefepime-UTI ruled out.  Chronic HFrEF Volume removal with HD  CAD No anginal symptoms Blocker/statin Suspect not on ASA due to being on Eliquis.  PAF Telemetry monitoring Coreg/amiodarone Eliquis briefly held for EGD but has since been resumed.   ESRD on HD TTS Nephrology following  Hypokalemia Repleted  Hyponatremia Due to excess volume-worse today due to hyperglycemia (pseudohyponatremia) Should improve with improvement in sugar/further dialysis.  Normocytic anemia Secondary to CKD Follow CBC and transfuse if significant drop  PAD-s/p right BKA  Depression/anxiety Relatively stable Effexor/Remeron/Klonopin  Hypothyroidism Synthroid TSH on 8/22 stable  Debility/deconditioning PT/OT eval-Home health recommended  Chronic venous ulcer-left lateral/posterior leg Present prior to admission Follows with Dr. Lajoyce Corners Continue dressing changes per wound care team.  BMI: Estimated body mass index is 28.62 kg/m as calculated from the following:   Height as of this encounter: 5\' 5"  (1.651 m).   Weight as of this encounter: 78 kg.   Code status:   Code Status: Full Code   DVT Prophylaxis: SCDs Start: 02/05/23 1719  apixaban (ELIQUIS) tablet 5 mg    Family Communication: None at bedside   Disposition Plan: Status is: Inpatient Remains inpatient appropriate because:     Planned Discharge Destination:Home   Diet: Diet Order             Diet full liquid Fluid consistency: Thin  Diet effective now                    MEDICATIONS: Scheduled Meds:  (feeding supplement) PROSource Plus  30 mL Oral BID BM   amiodarone  200 mg Oral Daily   apixaban  5 mg Oral BID   atorvastatin  80 mg Oral Daily   calcitRIOL  0.75 mcg Oral Q T,Th,Sa-HD   carvedilol  3.125 mg Oral BID WC   Chlorhexidine Gluconate Cloth  6 each Topical Q0600   cholestyramine  4 g Oral TID   clonazePAM  0.5 mg Oral BID   darbepoetin (ARANESP) injection - DIALYSIS  25 mcg Subcutaneous Q Sat-1800   famotidine  20 mg Oral Daily   ferric citrate  210 mg Oral TID WC   insulin aspart  0-6 Units Subcutaneous TID WC   insulin aspart  4 Units Subcutaneous TID WC   insulin glargine-yfgn  18 Units Subcutaneous QHS   levothyroxine  75 mcg Oral Q0600   mirtazapine  7.5 mg Oral QHS   pantoprazole  40 mg Oral BID   polyethylene glycol  17 g Oral Daily   prochlorperazine  10 mg Intravenous TID WC   sodium chloride flush  10-40 mL Intracatheter Q12H   venlafaxine XR  75 mg Oral Q breakfast   Continuous Infusions:     PRN Meds:.acetaminophen **OR** acetaminophen, bisacodyl, dextrose, methocarbamol, midodrine, trimethobenzamide   I have personally reviewed following labs and imaging studies  LABORATORY DATA: CBC: Recent Labs  Lab 02/10/23 1002 02/11/23 0409 02/12/23 0652 02/14/23 0702  WBC 6.1 6.2 6.4 7.1  HGB 10.3* 11.2* 11.3* 9.7*  HCT 34.8* 37.7 38.3 33.0*  MCV 76.5* 77.1* 77.8* 78.9*  PLT 198 199 211 191    Basic Metabolic Panel: Recent Labs  Lab 02/10/23 1003 02/11/23 0409 02/12/23 0648 02/12/23 0652 02/14/23 0702 02/14/23 1338 02/14/23 1446  NA 127* 133* 134*  --  125* 133* 134*  K 3.3* 3.4* 3.9  --  4.6 3.2* 3.4*  CL 93* 94* 95*  --  90* 96* 98  CO2 26 27 23   --  16* 27 27  GLUCOSE 326* 157* 150*  --  537* 115* 118*  BUN 35* 16 25*  --  37* 9 9   CREATININE 5.07* 3.35* 4.68*  --  5.16* 1.86* 1.98*  CALCIUM 8.2* 8.8* 9.0  --  8.8* 8.4* 8.6*  MG  --  1.9  --  2.3 1.9  --   --   PHOS 4.9* 3.5 4.2  --  4.5  --   --     GFR: Estimated Creatinine Clearance: 26.1 mL/min (A) (by C-G formula based on SCr of 1.98 mg/dL (H)).  Liver Function Tests: Recent Labs  Lab 02/11/23 0409 02/12/23 0648 02/14/23 0702 02/14/23 1338 02/14/23 1446  AST  --   --   --  22 19  ALT  --   --   --  20 18  ALKPHOS  --   --   --  173* 152*  BILITOT  --   --   --  0.6 0.7  PROT  --   --   --  6.4* 6.2*  ALBUMIN 2.8* 2.9* 2.8* 3.1* 2.9*   No results for input(s): "LIPASE", "AMYLASE" in the last 168 hours.  No results for input(s): "AMMONIA" in the last 168 hours.  Coagulation Profile: No results for input(s): "INR", "PROTIME" in the last 168 hours.  Cardiac Enzymes: No results for input(s): "CKTOTAL", "CKMB", "CKMBINDEX", "TROPONINI" in the last 168 hours.  BNP (last 3 results) No results for input(s): "PROBNP" in the last 8760 hours.  Lipid Profile: No results for input(s): "CHOL", "HDL", "LDLCALC", "TRIG", "CHOLHDL", "LDLDIRECT" in the last 72 hours.  Thyroid Function Tests: No results for input(s): "TSH", "T4TOTAL", "FREET4", "T3FREE", "THYROIDAB" in the last 72 hours.  Anemia Panel: No results for input(s): "VITAMINB12", "FOLATE", "FERRITIN", "TIBC", "IRON", "RETICCTPCT" in the last 72 hours.  Urine analysis:    Component Value Date/Time   COLORURINE AMBER (A) 02/05/2023 1418   APPEARANCEUR TURBID (A) 02/05/2023 1418   LABSPEC 1.015 02/05/2023 1418   PHURINE 6.0 02/05/2023 1418   GLUCOSEU >=500 (A) 02/05/2023 1418   HGBUR MODERATE (A) 02/05/2023 1418   BILIRUBINUR NEGATIVE 02/05/2023 1418   BILIRUBINUR negative 06/05/2020 1334   KETONESUR 5 (A) 02/05/2023 1418   PROTEINUR 100 (A) 02/05/2023 1418   UROBILINOGEN 0.2 06/05/2020 1334   UROBILINOGEN 0.2 04/01/2014 1659   NITRITE NEGATIVE 02/05/2023 1418   LEUKOCYTESUR LARGE (A)  02/05/2023 1418    Sepsis Labs: Lactic Acid, Venous    Component Value Date/Time   LATICACIDVEN 0.9 11/20/2022 0116    MICROBIOLOGY: No results found for this or any previous visit (from the past 240 hour(s)).  RADIOLOGY STUDIES/RESULTS: No results found.   LOS: 10 days   Signature  -    Susa Raring M.D on 02/15/2023 at 10:47 AM   -  To page go to [email or web address]

## 2023-02-16 ENCOUNTER — Encounter: Payer: PPO | Admitting: Orthopedic Surgery

## 2023-02-16 DIAGNOSIS — E1100 Type 2 diabetes mellitus with hyperosmolarity without nonketotic hyperglycemic-hyperosmolar coma (NKHHC): Secondary | ICD-10-CM | POA: Diagnosis not present

## 2023-02-16 LAB — GLUCOSE, CAPILLARY
Glucose-Capillary: 71 mg/dL (ref 70–99)
Glucose-Capillary: 178 mg/dL — ABNORMAL HIGH (ref 70–99)
Glucose-Capillary: 364 mg/dL — ABNORMAL HIGH (ref 70–99)
Glucose-Capillary: 376 mg/dL — ABNORMAL HIGH (ref 70–99)

## 2023-02-16 LAB — PHOSPHORUS: Phosphorus: 2.9 mg/dL (ref 2.5–4.6)

## 2023-02-16 MED ORDER — POLYETHYLENE GLYCOL 3350 17 G PO PACK
17.0000 g | PACK | Freq: Three times a day (TID) | ORAL | Status: DC
  Administered 2023-02-16 – 2023-02-17 (×3): 17 g via ORAL
  Filled 2023-02-16 (×3): qty 1

## 2023-02-16 NOTE — Progress Notes (Signed)
[name] Progress Note   Subjective: Seen in room. Sleeping, but rouses to voice. No new complaints   Objective Vitals:   02/16/23 0200 02/16/23 0400 02/16/23 0500 02/16/23 0747  BP:  115/64 120/60 (!) 111/54  Pulse: 61 (!) 56 63 (!) 54  Resp: (!) 8 12 11 15   Temp:   (!) 97.3 F (36.3 C) 97.6 F (36.4 C)  TempSrc:   Oral Axillary  SpO2: 100% 100% 100% 100%  Weight:      Height:       Physical Exam General: Chronically ill appearing, nad Heart: Regular rate  Lungs: Clear bilaterally  Abdomen: soft non -tender Extremities: R BKA; Trace LE edema  Dialysis Access: RIJ TDC drsg intact   Additional Objective Labs: Basic Metabolic Panel: Recent Labs  Lab 02/14/23 0702 02/14/23 1338 02/14/23 1446 02/15/23 1043 02/16/23 0440  NA 125* 133* 134* 131*  --   K 4.6 3.2* 3.4* 3.5  --   CL 90* 96* 98 98  --   CO2 16* 27 27 25   --   GLUCOSE 537* 115* 118* 263*  --   BUN 37* 9 9 16   --   CREATININE 5.16* 1.86* 1.98* 3.56*  --   CALCIUM 8.8* 8.4* 8.6* 8.9  --   PHOS 4.5  --   --  3.3 2.9   Liver Function Tests: Recent Labs  Lab 02/14/23 1338 02/14/23 1446 02/15/23 1043  AST 22 19  --   ALT 20 18  --   ALKPHOS 173* 152*  --   BILITOT 0.6 0.7  --   PROT 6.4* 6.2*  --   ALBUMIN 3.1* 2.9* 2.8*   No results for input(s): "LIPASE", "AMYLASE" in the last 168 hours. CBC: Recent Labs  Lab 02/10/23 1002 02/11/23 0409 02/12/23 0652 02/14/23 0702 02/15/23 1043  WBC 6.1 6.2 6.4 7.1 6.7  HGB 10.3* 11.2* 11.3* 9.7* 10.8*  HCT 34.8* 37.7 38.3 33.0* 35.7*  MCV 76.5* 77.1* 77.8* 78.9* 77.9*  PLT 198 199 211 191 196   Blood Culture    Component Value Date/Time   SDES TISSUE 11/14/2022 0919   SPECREQUEST NONE 11/14/2022 0919   CULT  11/14/2022 0919    FEW PROTEUS MIRABILIS RARE KLEBSIELLA PNEUMONIAE NO ANAEROBES ISOLATED FEW ENTEROCOCCUS FAECALIS    REPTSTATUS 11/19/2022 FINAL 11/14/2022 0919    Cardiac Enzymes: No results for input(s):  "CKTOTAL", "CKMB", "CKMBINDEX", "TROPONINI" in the last 168 hours. CBG: Recent Labs  Lab 02/15/23 0850 02/15/23 1233 02/15/23 1706 02/15/23 2125 02/16/23 0748  GLUCAP 234* 236* 83 113* 71   Iron Studies: No results for input(s): "IRON", "TIBC", "TRANSFERRIN", "FERRITIN" in the last 72 hours. @lablastinr3 @ Studies/Results: No results found. Medications:   (feeding supplement) PROSource Plus  30 mL Oral BID BM   amiodarone  200 mg Oral Daily   apixaban  5 mg Oral BID   atorvastatin  80 mg Oral Daily   calcitRIOL  0.75 mcg Oral Q T,Th,Sa-HD   carvedilol  3.125 mg Oral BID WC   Chlorhexidine Gluconate Cloth  6 each Topical Q0600   cholestyramine  4 g Oral TID   clonazePAM  0.5 mg Oral BID   darbepoetin (ARANESP) injection - DIALYSIS  25 mcg Subcutaneous Q Sat-1800   famotidine  20 mg Oral Daily   ferric citrate  210 mg Oral TID WC   insulin aspart  0-6 Units Subcutaneous TID WC   insulin aspart  4 Units Subcutaneous TID WC   insulin glargine-yfgn  18 Units Subcutaneous QHS   levothyroxine  75 mcg Oral Q0600   mirtazapine  7.5 mg Oral QHS   pantoprazole  40 mg Oral BID   polyethylene glycol  17 g Oral Daily   prochlorperazine  10 mg Intravenous TID WC   sodium chloride flush  10-40 mL Intracatheter Q12H   venlafaxine XR  75 mg Oral Q breakfast     Dialysis Orders: TTS [facility] 4hr, 400/800, EDW 79kg, 2K/2Ca bath, TDC,  - Heparin 4000 units IV initial bolus + 2000 units IV midrun - Aranesp given 9/3 during recent admit - Calcitriol 0.36mcg PO q HD - Binder recently changed from renvela to [place] Just discharged 02/02/2023   Assessment/Plan: 1. Intractable nausea/vomiting: Just discharged with same, readmitted 9/12. ?Felt due to colitis and/or gastroparesis, uncontrolled BS on admit. S/p 3 doses of Reglan. Seen by GI went for upper endo. Normal esophagus, no intervention. Per primary. .  2. ESRD: Usual TTS schedule -Next HD 9/24 3. HTN/volume: Edema in hips which is  somewhat her norm. Increase UF with HD as tolerated. Decreased carvedilol to 3.25 mg bid  4. Anemia: Hgb 9.7. Give Aranesp 25 mcg 9/21.  5. Secondary hyperparathyroidism: CorrCa ok, Phos low. Reducing Auryxia to 1/meals. 6. Nutrition: Alb low, adding supps. 7. T2DM: BS > 600 on admit - better with insulin. 8. CAD 9. pAF: On Eliquis   Susan [name] Susan Givens PA-C [facility] 02/16/2023,9:24 AM

## 2023-02-16 NOTE — Progress Notes (Signed)
Subjective: Patient seems to be doing better with regards to her nausea and vomiting.  The Compazine seems to be helping.  However she has problems with chronic constipation has had not had a BM in 4 days.  She is getting 1 dose of MiraLAX 17 g/day.  She is anticipating discharge today.  Objective: Vital signs in last 24 hours: Temp:  [97.3 F (36.3 C)-98 F (36.7 C)] 97.5 F (36.4 C) (09/23 1201) Pulse Rate:  [54-71] 71 (09/23 1201) Resp:  [8-17] 16 (09/23 1201) BP: (98-129)/(36-64) 109/52 (09/23 1201) SpO2:  [100 %] 100 % (09/23 1201) Last BM Date : 02/13/23  Intake/Output from previous day: 09/22 0701 - 09/23 0700 In: 240 [P.O.:240] Out: -  Intake/Output this shift: Total I/O In: 10 [I.V.:10] Out: -   General appearance: alert, cooperative, appears stated age, fatigued, and no distress Resp: clear to auscultation bilaterally Cardio: regular rate and rhythm, S1, S2 normal, no murmur, click, rub or gallop GI: soft, non-tender; bowel sounds normal; no masses,  no organomegaly  Lab Results: Recent Labs    02/14/23 0702 02/15/23 1043  WBC 7.1 6.7  HGB 9.7* 10.8*  HCT 33.0* 35.7*  PLT 191 196   BMET Recent Labs    02/14/23 1338 02/14/23 1446 02/15/23 1043  NA 133* 134* 131*  K 3.2* 3.4* 3.5  CL 96* 98 98  CO2 27 27 25   GLUCOSE 115* 118* 263*  BUN 9 9 16   CREATININE 1.86* 1.98* 3.56*  CALCIUM 8.4* 8.6* 8.9   LFT Recent Labs    02/14/23 1446 02/15/23 1043  PROT 6.2*  --   ALBUMIN 2.9* 2.8*  AST 19  --   ALT 18  --   ALKPHOS 152*  --   BILITOT 0.7  --    Studies/Results: No results found.  Medications: I have reviewed the patient's current medications. Prior to Admission:  Medications Prior to Admission  Medication Sig Dispense Refill Last Dose   acetaminophen (TYLENOL) 325 MG tablet Take 2 tablets (650 mg total) by mouth every 6 (six) hours as needed for mild pain (or Fever >/= 101).   Past Month   amiodarone (PACERONE) 200 MG tablet Take 1 tablet  (200 mg total) by mouth daily. 30 tablet 0 Past Month   apixaban (ELIQUIS) 5 MG TABS tablet Take 1 tablet (5 mg total) by mouth 2 (two) times daily. 60 tablet 0 Past Month at unknown   ascorbic acid (VITAMIN C) 500 MG tablet Take 1 tablet (500 mg total) by mouth daily. 100 tablet 0 Past Month   atorvastatin (LIPITOR) 80 MG tablet TAKE 1 TABLET BY MOUTH EVERY DAY 90 tablet 0 Past Month   carvedilol (COREG) 6.25 MG tablet Take 6.25 mg by mouth 2 (two) times daily with a meal.   Past Month   clonazePAM (KLONOPIN) 0.5 MG disintegrating tablet Take 1 tablet (0.5 mg total) by mouth 2 (two) times daily. 60 tablet 1 Past Month   ferric citrate (AURYXIA) 1 GM 210 MG(Fe) tablet Take 2 tablets (420 mg total) by mouth 3 (three) times daily with meals. 180 tablet 0 Past Month   folic acid (FOLVITE) 1 MG tablet Take 1 tablet (1 mg total) by mouth daily. 30 tablet 1 Past Month   insulin aspart (NOVOLOG) 100 UNIT/ML injection Inject 0-15 Units into the skin 3 (three) times daily with meals. CBG < 70: treat low blood sugar CBG 70 - 120: 0 units CBG 121 - 150: 2 units CBG 151 - 200:  3 units CBG 201 - 250: 5 units CBG 251 - 300: 8 units CBG 301 - 350: 11 units CBG 351 - 400: 15 units CBG > 400: call MD   Past Week   insulin glargine (LANTUS) 100 UNIT/ML Solostar Pen Inject 26 Units into the skin daily.   02/04/2023   levothyroxine (SYNTHROID) 75 MCG tablet Take 1 tablet (75 mcg total) by mouth daily before breakfast. 30 tablet 0 Past Month   LORazepam (ATIVAN) 0.5 MG tablet Take 0.5 mg by mouth at bedtime.   Past Week   melatonin 3 MG TABS tablet Take 1 tablet (3 mg total) by mouth at bedtime. 30 tablet 0 Past Month   methocarbamol (ROBAXIN) 750 MG tablet Take 1 tablet (750 mg total) by mouth every 6 (six) hours as needed for muscle spasms. 60 tablet 0 Past Month   midodrine (PROAMATINE) 10 MG tablet Take 1 tablet (10 mg total) by mouth Every Tuesday,Thursday,and Saturday with dialysis. 30 tablet 0 Past Month    mirtazapine (REMERON) 7.5 MG tablet Take 1 tablet (7.5 mg total) by mouth at bedtime. 30 tablet 0 Past Month   [EXPIRED] nystatin (MYCOSTATIN) 100000 UNIT/ML suspension Take 5 mLs (500,000 Units total) by mouth 4 (four) times daily for 4 days. 60 mL 0 02/04/2023   pantoprazole (PROTONIX) 40 MG tablet Take 1 tablet (40 mg total) by mouth 2 (two) times daily before a meal. 180 tablet 1 Past Month   polyethylene glycol (MIRALAX / GLYCOLAX) 17 g packet Take 17 g by mouth daily. (Patient taking differently: Take 17 g by mouth See admin instructions. Every other day)   02/04/2023   prochlorperazine (COMPAZINE) 5 MG tablet Take 1 tablet (5 mg total) by mouth every 8 (eight) hours as needed for up to 5 days for nausea or vomiting. 15 tablet 0 02/04/2023   venlafaxine XR (EFFEXOR-XR) 75 MG 24 hr capsule Take 1 capsule (75 mg total) by mouth daily with breakfast. Stop trintellix 30 capsule 3 Past Week   Zinc Sulfate 220 (50 Zn) MG TABS Take 1 tablet (220 mg total) by mouth daily. 100 tablet 0 Past Month   Scheduled:  (feeding supplement) PROSource Plus  30 mL Oral BID BM   amiodarone  200 mg Oral Daily   apixaban  5 mg Oral BID   atorvastatin  80 mg Oral Daily   calcitRIOL  0.75 mcg Oral Q T,Th,Sa-HD   carvedilol  3.125 mg Oral BID WC   Chlorhexidine Gluconate Cloth  6 each Topical Q0600   cholestyramine  4 g Oral TID   clonazePAM  0.5 mg Oral BID   darbepoetin (ARANESP) injection - DIALYSIS  25 mcg Subcutaneous Q Sat-1800   famotidine  20 mg Oral Daily   ferric citrate  210 mg Oral TID WC   insulin aspart  0-6 Units Subcutaneous TID WC   insulin aspart  4 Units Subcutaneous TID WC   insulin glargine-yfgn  18 Units Subcutaneous QHS   levothyroxine  75 mcg Oral Q0600   mirtazapine  7.5 mg Oral QHS   pantoprazole  40 mg Oral BID   polyethylene glycol  17 g Oral TID   prochlorperazine  10 mg Intravenous TID WC   sodium chloride flush  10-40 mL Intracatheter Q12H   venlafaxine XR  75 mg Oral Q breakfast    Continuous:  Assessment/Plan: 1) Nausea and vomiting secondary to gastroparesis improved with Compazine. I am not sure if the cholestyramine she is receiving is causing her to  be constipated this might have to be discontinued if her constipation becomes an issue.  2) End-stage renal disease on hemodialysis TTS. 3) Depression/anxiety. 4) Hypothroidism. 5) Chronic venous ulcer-left lateral leg   LOS: 11 days   Charna Elizabeth 02/16/2023, 12:52 PM

## 2023-02-16 NOTE — Plan of Care (Signed)
  Problem: Pain Management: Goal: Pain level will decrease with appropriate interventions Outcome: Progressing   Problem: Skin Integrity: Goal: Demonstration of wound healing without infection will improve Outcome: Progressing   Problem: Health Behavior/Discharge Planning: Goal: Ability to safely manage health-related needs after discharge will improve Outcome: Progressing   Problem: Health Behavior/Discharge Planning: Goal: Ability to manage health-related needs will improve Outcome: Progressing

## 2023-02-16 NOTE — Progress Notes (Signed)
Physical Therapy Treatment Patient Details Name: Susan [name] MRN: [id number] DOB: 18-Mar-1951 Today's Date: 02/16/2023   History of Present Illness 72 y.o. female admitted 9/12 with nausea and vomiting in setting of hyperosmolar hyperglycemic state. PMHx: Pt recently admitted 8/22-8/24 and 8/27-9/9 for similar symptoms, found to have proctocolitis as well as early DKA. OA, CAD, chronic diastolic HF, depression, ESRD on HD TTS, HTN, HLD, CVA, T2DM, Rt BKA.    PT Comments  Pt seen in session with OT to progress OOB mobility. Pt in good spirits today with good tolerance of activity/mobility. She required mod assist bed mobility, and +2 mod assist slide board transfer bed to recliner. Pt instructed on LE exercises to perform throughout the day. Pt in recliner at end of session. She is progressing well. PT recommending further rehab at d/c in inpatient setting, < 3 hours/day. Pt's desire is to return home with HHPT.     If plan is discharge home, recommend the following: Assistance with cooking/housework;Assist for transportation;Help with stairs or ramp for entrance;Two people to help with walking and/or transfers;A lot of help with bathing/dressing/bathroom;Supervision due to cognitive status   Can travel by private vehicle     No  Equipment Recommendations  Other (comment) (slide board)    Recommendations for Other Services       Precautions / Restrictions Precautions Precautions: Fall;Other (comment) Precaution Comments: R BKA, LLE wound Restrictions Weight Bearing Restrictions: No RLE Weight Bearing: Weight bearing as tolerated (BKA) LLE Weight Bearing: Weight bearing as tolerated Other Position/Activity Restrictions: per pt chart she reports Dr. Lajoyce Corners does not want her to put any weight on her LLE due to skin graft - sent message to Dr. Lajoyce Corners to clarify on 02/06/23 at 10:27 AM on 02/06/23 Dr. Lajoyce Corners states pt is full WB     Mobility  Bed Mobility Overal bed mobility: Needs  Assistance Bed Mobility: Rolling, Sidelying to Sit Rolling: Min assist Sidelying to sit: Used rails, Mod assist       General bed mobility comments: increased time, cues for sequencing    Transfers Overall transfer level: Needs assistance Equipment used: Sliding board Transfers: Bed to chair/wheelchair/BSC            Lateral/Scoot Transfers: Mod assist, +2 physical assistance General transfer comment: lateral scoot bed to drop-arm recliner with slide board toward R. Cues for sequencing. Assist using bed pad under hips.    Ambulation/Gait                   Stairs             Wheelchair Mobility     Tilt Bed    Modified Rankin (Stroke Patients Only)       Balance Overall balance assessment: Needs assistance Sitting-balance support: No upper extremity supported, Feet supported, Bilateral upper extremity supported Sitting balance-Leahy Scale: Good                                      Cognition Arousal: Alert Behavior During Therapy: WFL for tasks assessed/performed Overall Cognitive Status: Within Functional Limits for tasks assessed                                          Exercises General Exercises - Lower Extremity Ankle Circles/Pumps: AROM, Left Heel Slides: AROM, Left  Hip ABduction/ADduction: AROM, Both    General Comments General comments (skin integrity, edema, etc.): VSS on RA      Pertinent Vitals/Pain Pain Assessment Pain Assessment: Faces Faces Pain Scale: Hurts a little bit Pain Location: generalized Pain Descriptors / Indicators: Grimacing Pain Intervention(s): Repositioned, Monitored during session    Home Living                          Prior Function            PT Goals (current goals can now be found in the care plan section) Acute Rehab PT Goals Patient Stated Goal: Return home with HHPT Progress towards PT goals: Progressing toward goals    Frequency    Min  1X/week      PT Plan      Co-evaluation PT/OT/SLP Co-Evaluation/Treatment: Yes Reason for Co-Treatment: For patient/therapist safety;To address functional/ADL transfers PT goals addressed during session: Mobility/safety with mobility;Balance;Proper use of DME OT goals addressed during session: ADL's and self-care      AM-PAC PT "6 Clicks" Mobility   Outcome Measure  Help needed turning from your back to your side while in a flat bed without using bedrails?: A Little Help needed moving from lying on your back to sitting on the side of a flat bed without using bedrails?: A Lot Help needed moving to and from a bed to a chair (including a wheelchair)?: Total Help needed standing up from a chair using your arms (e.g., wheelchair or bedside chair)?: Total Help needed to walk in hospital room?: Total Help needed climbing 3-5 steps with a railing? : Total 6 Click Score: 9    End of Session Equipment Utilized During Treatment: Gait belt;Oxygen (mobilized on 2L. On RA at rest at end of session.) Activity Tolerance: Patient tolerated treatment well Patient left: in chair;with call bell/phone within reach Nurse Communication: Mobility status PT Visit Diagnosis: Other abnormalities of gait and mobility (R26.89);Muscle weakness (generalized) (M62.81)     Time: 5643-3295 PT Time Calculation (min) (ACUTE ONLY): 24 min  Charges:    $Therapeutic Activity: 8-22 mins PT General Charges $$ ACUTE PT VISIT: 1 Visit                     Ferd Glassing., PT  Office # [phone]    Ilda Foil 02/16/2023, 11:46 AM

## 2023-02-16 NOTE — Progress Notes (Signed)
PROGRESS NOTE        PATIENT DETAILS Name: Susan [name] Age: 72 y.o. Sex: female Date of Birth: May 27, 1950 Admit Date: 02/05/2023 Admitting Physician Alan Mulder, MD VOZ:[id number], Priscille Heidelberg, MD  Brief Summary: Patient is a 72 y.o.  female with history of ESRD on HD TTS, DM-2, PAF, CAD, HFrEF, HTN, PAD s/p right BKA-presented with nausea/vomiting in the setting of hyperosmolar hyperglycemic state.  Hospital course complicated by intractable nausea and vomiting-see below for further details.  Significant events: 9/12>> admit to [facility]  Significant studies: 9/14>> CT abdomen: No SBO  Significant microbiology data: None  Procedures: 9/19>> EGD: Normal esophagus, bilious gastric fluid  Consults: Nephrology  Subjective:  Patient in bed, appears comfortable, denies any headache, no fever, no chest pain or pressure, no shortness of breath , no abdominal pain, improved nausea. No new focal weakness.  Objective: Vitals: Blood pressure (!) 113/55, pulse 62, temperature 97.6 F (36.4 C), temperature source Axillary, resp. rate 17, height 5\' 5"  (1.651 m), weight 78 kg, SpO2 100%.   Exam:  Awake Alert, No new F.N deficits, Normal affect [place].AT,PERRAL Supple Neck, No JVD,   Symmetrical Chest wall movement, Good air movement bilaterally, CTAB RRR,No Gallops, Rubs or new Murmurs,  +ve B.Sounds, Abd Soft, No tenderness,   No Cyanosis, Clubbing or edema    Assessment/Plan:  DM type II with initial hyperosmolar hyperglycemic state thereafter brief DKA. Insulin dose adjusted DKA resolved continue to monitor CBGs closely, avoiding increasing insulin as oral intake is extremely inconsistent due to underlying nausea.  Recent Labs    02/15/23 1706 02/15/23 2125 02/16/23 0748  GLUCAP 83 113* 71      Nausea/vomiting likely due to gastroparesis flare Unfortunately continues to have intermittent nausea and vomiting-in spite of being on scheduled  antiemetics. CT abdomen on 9/14 negative for SBO Since intermittent/intractable nausea/vomiting continued-GI was consulted on 9/19-endoscopy with no major findings-apart from bilious gastric secretions Has a wide QRS and prolonged QT as baseline-this limits a lot of treatment options GI is following the patient,  Reglan stopped by GI on 02/14/2023 currently on scheduled Compazine per GI along with cholestyramine at a reduced dose.  Nausea improved advance diet to soft on 02/16/2023, defer further management to GI.  Asymptomatic bacteriuria UA dirty but no symptoms-makes very little urine No longer on Cefepime-UTI ruled out.  Chronic HFrEF Volume removal with HD  CAD No anginal symptoms Blocker/statin Suspect not on ASA due to being on Eliquis.  PAF Telemetry monitoring Coreg/amiodarone Eliquis briefly held for EGD but has since been resumed.   ESRD on HD TTS Nephrology following  Hypokalemia Repleted  Hyponatremia Due to excess volume-worse today due to hyperglycemia (pseudohyponatremia) Should improve with improvement in sugar/further dialysis.  Normocytic anemia Secondary to CKD Follow CBC and transfuse if significant drop  PAD-s/p right BKA  Depression/anxiety Relatively stable Effexor/Remeron/Klonopin  Hypothyroidism Synthroid TSH on 8/22 stable  Debility/deconditioning PT/OT eval-Home health recommended  Chronic venous ulcer-left lateral/posterior leg Present prior to admission Follows with Dr. Lajoyce Corners Continue dressing changes per wound care team.  BMI: Estimated body mass index is 28.62 kg/m as calculated from the following:   Height as of this encounter: 5\' 5"  (1.651 m).   Weight as of this encounter: 78 kg.   Code status:   Code Status: Full Code   DVT Prophylaxis: SCDs Start: 02/05/23 1719 apixaban (  ELIQUIS) tablet 5 mg    Family Communication: None at bedside   Disposition Plan: Status is: Inpatient Remains inpatient appropriate because:     Planned Discharge Destination:Home   Diet: Diet Order             DIET SOFT Fluid consistency: Thin  Diet effective now                    MEDICATIONS: Scheduled Meds:  (feeding supplement) PROSource Plus  30 mL Oral BID BM   amiodarone  200 mg Oral Daily   apixaban  5 mg Oral BID   atorvastatin  80 mg Oral Daily   calcitRIOL  0.75 mcg Oral Q T,Th,Sa-HD   carvedilol  3.125 mg Oral BID WC   Chlorhexidine Gluconate Cloth  6 each Topical Q0600   cholestyramine  4 g Oral TID   clonazePAM  0.5 mg Oral BID   darbepoetin (ARANESP) injection - DIALYSIS  25 mcg Subcutaneous Q Sat-1800   famotidine  20 mg Oral Daily   ferric citrate  210 mg Oral TID WC   insulin aspart  0-6 Units Subcutaneous TID WC   insulin aspart  4 Units Subcutaneous TID WC   insulin glargine-yfgn  18 Units Subcutaneous QHS   levothyroxine  75 mcg Oral Q0600   mirtazapine  7.5 mg Oral QHS   pantoprazole  40 mg Oral BID   polyethylene glycol  17 g Oral Daily   prochlorperazine  10 mg Intravenous TID WC   sodium chloride flush  10-40 mL Intracatheter Q12H   venlafaxine XR  75 mg Oral Q breakfast   Continuous Infusions:     PRN Meds:.acetaminophen **OR** acetaminophen, bisacodyl, dextrose, methocarbamol, midodrine, trimethobenzamide   I have personally reviewed following labs and imaging studies  LABORATORY DATA: CBC: Recent Labs  Lab 02/10/23 1002 02/11/23 0409 02/12/23 0652 02/14/23 0702 02/15/23 1043  WBC 6.1 6.2 6.4 7.1 6.7  HGB 10.3* 11.2* 11.3* 9.7* 10.8*  HCT 34.8* 37.7 38.3 33.0* 35.7*  MCV 76.5* 77.1* 77.8* 78.9* 77.9*  PLT 198 199 211 191 196    Basic Metabolic Panel: Recent Labs  Lab 02/11/23 0409 02/12/23 0648 02/12/23 0652 02/14/23 0702 02/14/23 1338 02/14/23 1446 02/15/23 1043 02/16/23 0440  NA 133* 134*  --  125* 133* 134* 131*  --   K 3.4* 3.9  --  4.6 3.2* 3.4* 3.5  --   CL 94* 95*  --  90* 96* 98 98  --   CO2 27 23  --  16* 27 27 25   --   GLUCOSE 157*  150*  --  537* 115* 118* 263*  --   BUN 16 25*  --  37* 9 9 16   --   CREATININE 3.35* 4.68*  --  5.16* 1.86* 1.98* 3.56*  --   CALCIUM 8.8* 9.0  --  8.8* 8.4* 8.6* 8.9  --   MG 1.9  --  2.3 1.9  --   --  1.6*  --   PHOS 3.5 4.2  --  4.5  --   --  3.3 2.9    GFR: Estimated Creatinine Clearance: 14.5 mL/min (A) (by C-G formula based on SCr of 3.56 mg/dL (H)).  Liver Function Tests: Recent Labs  Lab 02/12/23 0648 02/14/23 0702 02/14/23 1338 02/14/23 1446 02/15/23 1043  AST  --   --  22 19  --   ALT  --   --  20 18  --   ALKPHOS  --   --  173* 152*  --   BILITOT  --   --  0.6 0.7  --   PROT  --   --  6.4* 6.2*  --   ALBUMIN 2.9* 2.8* 3.1* 2.9* 2.8*   No results for input(s): "LIPASE", "AMYLASE" in the last 168 hours.  No results for input(s): "AMMONIA" in the last 168 hours.  Coagulation Profile: No results for input(s): "INR", "PROTIME" in the last 168 hours.  Cardiac Enzymes: No results for input(s): "CKTOTAL", "CKMB", "CKMBINDEX", "TROPONINI" in the last 168 hours.  BNP (last 3 results) No results for input(s): "PROBNP" in the last 8760 hours.  Lipid Profile: No results for input(s): "CHOL", "HDL", "LDLCALC", "TRIG", "CHOLHDL", "LDLDIRECT" in the last 72 hours.  Thyroid Function Tests: No results for input(s): "TSH", "T4TOTAL", "FREET4", "T3FREE", "THYROIDAB" in the last 72 hours.  Anemia Panel: No results for input(s): "VITAMINB12", "FOLATE", "FERRITIN", "TIBC", "IRON", "RETICCTPCT" in the last 72 hours.  Urine analysis:    Component Value Date/Time   COLORURINE AMBER (A) 02/05/2023 1418   APPEARANCEUR TURBID (A) 02/05/2023 1418   LABSPEC 1.015 02/05/2023 1418   PHURINE 6.0 02/05/2023 1418   GLUCOSEU >=500 (A) 02/05/2023 1418   HGBUR MODERATE (A) 02/05/2023 1418   BILIRUBINUR NEGATIVE 02/05/2023 1418   BILIRUBINUR negative 06/05/2020 1334   KETONESUR 5 (A) 02/05/2023 1418   PROTEINUR 100 (A) 02/05/2023 1418   UROBILINOGEN 0.2 06/05/2020 1334    UROBILINOGEN 0.2 04/01/2014 1659   NITRITE NEGATIVE 02/05/2023 1418   LEUKOCYTESUR LARGE (A) 02/05/2023 1418    Sepsis Labs: Lactic Acid, Venous    Component Value Date/Time   LATICACIDVEN 0.9 11/20/2022 0116    MICROBIOLOGY: No results found for this or any previous visit (from the past 240 hour(s)).  RADIOLOGY STUDIES/RESULTS: No results found.   LOS: 11 days   Signature  -    Susa Raring M.D on 02/16/2023 at 10:19 AM   -  To page go to [email or web address]

## 2023-02-16 NOTE — Progress Notes (Signed)
Occupational Therapy Treatment Patient Details Name: Susan [name] MRN: [id number] DOB: July 11, 1950 Today's Date: 02/16/2023   History of present illness 72 y.o. female admitted 9/12 with nausea and vomiting in setting of hyperosmolar hyperglycemic state. PMHx: Pt recently admitted 8/22-8/24 and 8/27-9/9 for similar symptoms, found to have proctocolitis as well as early DKA. OA, CAD, chronic diastolic HF, depression, ESRD on HD TTS, HTN, HLD, CVA, T2DM, Rt BKA.   OT comments  Patient seen with PT to address bed mobility and transfers. Patient demonstrating good gains this treatment session with gain with bed mobility and transfers. Patient discharge plans continue to be home with [facility] and family assist. Acute OT to continue to follow to address self care and functional transfers.       If plan is discharge home, recommend the following:  A lot of help with walking and/or transfers;A lot of help with bathing/dressing/bathroom;Assistance with cooking/housework;Help with stairs or ramp for entrance;Assist for transportation   Equipment Recommendations  Other (comment) (drop arm BSC)    Recommendations for Other Services      Precautions / Restrictions Precautions Precautions: Fall Precaution Comments: R BKA, LLE wound Restrictions Weight Bearing Restrictions: No RLE Weight Bearing: Weight bearing as tolerated (BKA) LLE Weight Bearing: Weight bearing as tolerated Other Position/Activity Restrictions: per pt chart she reports Dr. Lajoyce Corners does not want her to put any weight on her LLE due to skin graft - sent message to Dr. Lajoyce Corners to clarify on 02/06/23 at 10:27 AM on 02/06/23 Dr. Lajoyce Corners states pt is full WB       Mobility Bed Mobility Overal bed mobility: Needs Assistance Bed Mobility: Rolling, Sidelying to Sit Rolling: Min assist Sidelying to sit: Used rails, Mod assist       General bed mobility comments: cues to initiage and increased time    Transfers Overall transfer level: Needs  assistance Equipment used: Sliding board Transfers: Bed to chair/wheelchair/BSC            Lateral/Scoot Transfers: Mod assist, +2 physical assistance General transfer comment: cues for hand placement and increased participation from patient     Balance Overall balance assessment: Needs assistance Sitting-balance support: No upper extremity supported, Feet supported, Bilateral upper extremity supported Sitting balance-Leahy Scale: Good Sitting balance - Comments: sitting EOB                                   ADL either performed or assessed with clinical judgement   ADL Overall ADL's : Needs assistance/impaired     Grooming: Wash/dry hands;Wash/dry face;Sitting;Brushing hair;Minimal assistance Grooming Details (indicate cue type and reason): in recliner, assistance with combing hair                                    Extremity/Trunk Assessment              Vision       Perception     Praxis      Cognition Arousal: Alert Behavior During Therapy: WFL for tasks assessed/performed Overall Cognitive Status: Within Functional Limits for tasks assessed                                 General Comments: jovial this session, smiling        Exercises  Shoulder Instructions       General Comments      Pertinent Vitals/ Pain       Pain Assessment Pain Assessment: Faces Faces Pain Scale: Hurts a little bit Pain Location: generalized Pain Descriptors / Indicators: Grimacing Pain Intervention(s): Limited activity within patient's tolerance, Monitored during session, Repositioned  Home Living                                          Prior Functioning/Environment              Frequency  Min 1X/week        Progress Toward Goals  OT Goals(current goals can now be found in the care plan section)  Progress towards OT goals: Progressing toward goals  Acute Rehab OT Goals Patient  Stated Goal: go home OT Goal Formulation: With patient Time For Goal Achievement: 02/20/23 Potential to Achieve Goals: Good ADL Goals Pt Will Perform Grooming: with modified independence;sitting Pt Will Perform Lower Body Dressing: with mod assist;sitting/lateral leans Pt Will Transfer to Toilet: with mod assist;with transfer board;bedside commode Pt/caregiver will Perform Home Exercise Program: Increased strength;Right Upper extremity;Left upper extremity;Both right and left upper extremity;With written HEP provided Additional ADL Goal #1: PT will be S in and OOB for basic ADLs Additional ADL Goal #2: Pt will be able sit EOB unsupported and do 3 grooming activities without LB at CGA level  Plan      Co-evaluation    PT/OT/SLP Co-Evaluation/Treatment: Yes Reason for Co-Treatment: For patient/therapist safety;To address functional/ADL transfers   OT goals addressed during session: ADL's and self-care      AM-PAC OT "6 Clicks" Daily Activity     Outcome Measure   Help from another person eating meals?: None Help from another person taking care of personal grooming?: A Little Help from another person toileting, which includes using toliet, bedpan, or urinal?: Total Help from another person bathing (including washing, rinsing, drying)?: A Lot Help from another person to put on and taking off regular upper body clothing?: A Little Help from another person to put on and taking off regular lower body clothing?: Total 6 Click Score: 14    End of Session Equipment Utilized During Treatment: Gait belt;Other (comment) (slide board)  OT Visit Diagnosis: Other abnormalities of gait and mobility (R26.89);Muscle weakness (generalized) (M62.81);Pain   Activity Tolerance Patient tolerated treatment well   Patient Left in chair;with call bell/phone within reach;with chair alarm set   Nurse Communication Mobility status;Other (comment) (recommended lateral scoot transfer back to bed with  assistance of 2)        Time: 1610-9604 OT Time Calculation (min): 24 min  Charges: OT General Charges $OT Visit: 1 Visit OT Treatments $Self Care/Home Management : 8-22 mins  Alfonse Flavors, OTA Acute Rehabilitation Services  Office [phone]   Dewain Penning 02/16/2023, 11:27 AM

## 2023-02-17 ENCOUNTER — Other Ambulatory Visit (HOSPITAL_COMMUNITY): Payer: Self-pay

## 2023-02-17 DIAGNOSIS — E1100 Type 2 diabetes mellitus with hyperosmolarity without nonketotic hyperglycemic-hyperosmolar coma (NKHHC): Secondary | ICD-10-CM | POA: Diagnosis not present

## 2023-02-17 LAB — RENAL FUNCTION PANEL
Albumin: 2.6 g/dL — ABNORMAL LOW (ref 3.5–5.0)
Anion gap: 8 (ref 5–15)
BUN: 35 mg/dL — ABNORMAL HIGH (ref 8–23)
CO2: 25 mmol/L (ref 22–32)
Calcium: 8.8 mg/dL — ABNORMAL LOW (ref 8.9–10.3)
Chloride: 94 mmol/L — ABNORMAL LOW (ref 98–111)
Creatinine, Ser: 4.98 mg/dL — ABNORMAL HIGH (ref 0.44–1.00)
GFR, Estimated: 9 mL/min — ABNORMAL LOW (ref >60)
Glucose, Bld: 164 mg/dL — ABNORMAL HIGH (ref 70–99)
Phosphorus: 2.7 mg/dL (ref 2.5–4.6)
Potassium: 3.8 mmol/L (ref 3.5–5.1)
Sodium: 127 mmol/L — ABNORMAL LOW (ref 135–145)

## 2023-02-17 LAB — GLUCOSE, CAPILLARY
Glucose-Capillary: 169 mg/dL — ABNORMAL HIGH (ref 70–99)
Glucose-Capillary: 91 mg/dL (ref 70–99)

## 2023-02-17 LAB — CBC
HCT: 33.4 % — ABNORMAL LOW (ref 36.0–46.0)
Hemoglobin: 10.1 g/dL — ABNORMAL LOW (ref 12.0–15.0)
MCH: 24.5 pg — ABNORMAL LOW (ref 26.0–34.0)
MCHC: 30.2 g/dL (ref 30.0–36.0)
MCV: 81.1 fL (ref 80.0–100.0)
Platelets: 186 10*3/uL (ref 150–400)
RBC: 4.12 MIL/uL (ref 3.87–5.11)
RDW: 19.7 % — ABNORMAL HIGH (ref 11.5–15.5)
WBC: 5.2 10*3/uL (ref 4.0–10.5)
nRBC: 0 % (ref 0.0–0.2)

## 2023-02-17 MED ORDER — PROCHLORPERAZINE MALEATE 5 MG PO TABS
5.0000 mg | ORAL_TABLET | Freq: Three times a day (TID) | ORAL | 0 refills | Status: DC | PRN
  Filled 2023-02-17: qty 15, 5d supply, fill #0

## 2023-02-17 MED ORDER — INSULIN GLARGINE-YFGN 100 UNIT/ML ~~LOC~~ SOLN
10.0000 [IU] | Freq: Every day | SUBCUTANEOUS | Status: DC
  Administered 2023-02-17: 10 [IU] via SUBCUTANEOUS
  Filled 2023-02-17: qty 0.1

## 2023-02-17 MED ORDER — HEPARIN SODIUM (PORCINE) 1000 UNIT/ML IJ SOLN
INTRAMUSCULAR | Status: AC
  Administered 2023-02-17: 1000 [IU]
  Filled 2023-02-17: qty 4

## 2023-02-17 MED ORDER — INSULIN ASPART 100 UNIT/ML IJ SOLN
0.0000 [IU] | Freq: Three times a day (TID) | INTRAMUSCULAR | Status: DC
  Administered 2023-02-17: 3 [IU] via SUBCUTANEOUS

## 2023-02-17 NOTE — Discharge Summary (Signed)
Susan [name] [facility] NGE:[id number] DOB: February 20, 1951 DOA: 02/05/2023  PCP: Donita Brooks, MD  Admit date: 02/05/2023  Discharge date: 02/17/2023  Admitted From: Home   Disposition:  Home   Recommendations for Outpatient Follow-up:   Follow up with PCP in 1-2 weeks  PCP Please obtain BMP/CBC, 2 view CXR in 1week,  (see Discharge instructions)   PCP Please follow up on the following pending results:    Home Health: PT, RN, social work Equipment/Devices: walker  Consultations: Renal, GI Discharge Condition: Stable    CODE STATUS: Full    Diet Recommendation: Renal-low carbohydrate diet with 1.2 L fluid restriction per day    Chief Complaint  Patient presents with   Emesis   Hyperglycemia     Brief history of present illness from the day of admission and additional interim summary    72 y.o.  female with history of ESRD on HD TTS, DM-2, PAF, CAD, HFrEF, HTN, PAD s/p right BKA-presented with nausea/vomiting in the setting of hyperosmolar hyperglycemic state.  Hospital course complicated by intractable nausea and vomiting-see below for further details.   Significant events: 9/12>> admit to [facility]   Significant studies: 9/14>> CT abdomen: No SBO   Significant microbiology data: None   Procedures: 9/19>> EGD: Normal esophagus, bilious gastric fluid                                                                 Hospital Course   DM type II with initial hyperosmolar hyperglycemic state thereafter brief DKA. Insulin dose adjusted DKA resolved continue to monitor CBGs closely, avoiding increasing insulin as oral intake is extremely inconsistent due to underlying nausea.  With improvement in nausea, and more consistent oral intake insulin dose adjusted with better control with CBGs.  Continue home regimen upon  discharge with outpatient PCP monitoring.  Nausea/vomiting likely due to gastroparesis flare Unfortunately continues to have intermittent nausea and vomiting-in spite of being on scheduled antiemetics.  Apparently she has had this issue for several years and exacerbates quite frequently, she was seen by GI team, CT abdomen pelvis was nonacute, she underwent EGD on 02/12/2023 which was unremarkable as well.  She was eventually placed on Compazine as needed with good improvement on which she will be discharged, currently she is tolerating diet for close to 24 hours and eager to go home.  Will be dialyzed today thereafter discharge with outpatient follow-up with her PCP and gastroenterologist Dr. Loreta Ave.    Asymptomatic bacteriuria UA dirty but no symptoms-makes very little urine No longer on Cefepime-UTI ruled out.   Chronic HFrEF Volume removal with HD   CAD No anginal symptoms Blocker/statin Suspect not on ASA due to being on Eliquis.   PAF Telemetry monitoring Coreg/amiodarone Eliquis briefly held for EGD but has since been resumed.  Musculoskeletal: Degenerative changes of the visualized thoracolumbar spine. IMPRESSION: No evidence of bowel obstruction. Cholelithiasis, without associated inflammatory changes. Small bilateral pleural effusions associated bilateral lower lobe atelectasis. Electronically Signed   By: Charline Bills M.D.   On: 02/07/2023 18:25   VAS [name] ABI WITH/WO TBI  Result Date: 02/02/2023  LOWER EXTREMITY DOPPLER STUDY Patient Name:  ZEAH VANCLEAVE  Date of Exam:   02/02/2023 Medical Rec #: [id number]       Accession #:    [id number] Date of Birth: 02/03/1951       Patient Gender: F Patient Age:   89 years Exam Location:  [facility] Procedure:       VAS [name] ABI WITH/WO TBI Referring Phys: A POWELL JR --------------------------------------------------------------------------------  Indications: Ulceration, and peripheral artery disease. High Risk Factors: Hypertension, hyperlipidemia, Diabetes. Other Factors: ESRD on dialysis.  Vascular Interventions: Right BKA 05/02/22. Comparison Study: Prior ABI done 11/04/2017 Performing Technologist: Sherren Kerns RVS  Examination Guidelines: A complete evaluation includes at minimum, Doppler waveform signals and systolic blood pressure reading at the level of bilateral brachial, anterior tibial, and posterior tibial arteries, when vessel segments are accessible. Bilateral testing is considered an integral part of a complete examination. Photoelectric Plethysmograph (PPG) waveforms and toe systolic pressure readings are included as required and additional duplex testing as needed. Limited examinations for reoccurring indications may be performed as noted.  ABI Findings: +--------+------------------+-----+--------+--------+ Right   Rt Pressure (mmHg)IndexWaveformComment  +--------+------------------+-----+--------+--------+ [id number]                                     +--------+------------------+-----+--------+--------+ PTA                                    BKA      +--------+------------------+-----+--------+--------+ DP                                     BKA      +--------+------------------+-----+--------+--------+ +---------+------------------+-----+----------+-------+ Left     Lt Pressure (mmHg)IndexWaveform  Comment +---------+------------------+-----+----------+-------+ Brachial                                  graft   +---------+------------------+-----+----------+-------+ PTA      254               1.94 monophasic        +---------+------------------+-----+----------+-------+ DP       254               1.94 monophasic         +---------+------------------+-----+----------+-------+ Great Toe110               0.84                   +---------+------------------+-----+----------+-------+ +-------+-----------+-----------+------------+------------+ ABI/TBIToday's ABIToday's TBIPrevious ABIPrevious TBI +-------+-----------+-----------+------------+------------+ Right  BKA                   1.21        0.72         +-------+-----------+-----------+------------+------------+ Left   1.94       0.84       1.21        0.65         +-------+-----------+-----------+------------+------------+  Musculoskeletal: Degenerative changes of the visualized thoracolumbar spine. IMPRESSION: No evidence of bowel obstruction. Cholelithiasis, without associated inflammatory changes. Small bilateral pleural effusions associated bilateral lower lobe atelectasis. Electronically Signed   By: Charline Bills M.D.   On: 02/07/2023 18:25   VAS [name] ABI WITH/WO TBI  Result Date: 02/02/2023  LOWER EXTREMITY DOPPLER STUDY Patient Name:  ZEAH VANCLEAVE  Date of Exam:   02/02/2023 Medical Rec #: [id number]       Accession #:    [id number] Date of Birth: 02/03/1951       Patient Gender: F Patient Age:   89 years Exam Location:  [facility] Procedure:       VAS [name] ABI WITH/WO TBI Referring Phys: A POWELL JR --------------------------------------------------------------------------------  Indications: Ulceration, and peripheral artery disease. High Risk Factors: Hypertension, hyperlipidemia, Diabetes. Other Factors: ESRD on dialysis.  Vascular Interventions: Right BKA 05/02/22. Comparison Study: Prior ABI done 11/04/2017 Performing Technologist: Sherren Kerns RVS  Examination Guidelines: A complete evaluation includes at minimum, Doppler waveform signals and systolic blood pressure reading at the level of bilateral brachial, anterior tibial, and posterior tibial arteries, when vessel segments are accessible. Bilateral testing is considered an integral part of a complete examination. Photoelectric Plethysmograph (PPG) waveforms and toe systolic pressure readings are included as required and additional duplex testing as needed. Limited examinations for reoccurring indications may be performed as noted.  ABI Findings: +--------+------------------+-----+--------+--------+ Right   Rt Pressure (mmHg)IndexWaveformComment  +--------+------------------+-----+--------+--------+ [id number]                                     +--------+------------------+-----+--------+--------+ PTA                                    BKA      +--------+------------------+-----+--------+--------+ DP                                     BKA      +--------+------------------+-----+--------+--------+ +---------+------------------+-----+----------+-------+ Left     Lt Pressure (mmHg)IndexWaveform  Comment +---------+------------------+-----+----------+-------+ Brachial                                  graft   +---------+------------------+-----+----------+-------+ PTA      254               1.94 monophasic        +---------+------------------+-----+----------+-------+ DP       254               1.94 monophasic         +---------+------------------+-----+----------+-------+ Great Toe110               0.84                   +---------+------------------+-----+----------+-------+ +-------+-----------+-----------+------------+------------+ ABI/TBIToday's ABIToday's TBIPrevious ABIPrevious TBI +-------+-----------+-----------+------------+------------+ Right  BKA                   1.21        0.72         +-------+-----------+-----------+------------+------------+ Left   1.94       0.84       1.21        0.65         +-------+-----------+-----------+------------+------------+  Susan [name] [facility] NGE:[id number] DOB: February 20, 1951 DOA: 02/05/2023  PCP: Donita Brooks, MD  Admit date: 02/05/2023  Discharge date: 02/17/2023  Admitted From: Home   Disposition:  Home   Recommendations for Outpatient Follow-up:   Follow up with PCP in 1-2 weeks  PCP Please obtain BMP/CBC, 2 view CXR in 1week,  (see Discharge instructions)   PCP Please follow up on the following pending results:    Home Health: PT, RN, social work Equipment/Devices: walker  Consultations: Renal, GI Discharge Condition: Stable    CODE STATUS: Full    Diet Recommendation: Renal-low carbohydrate diet with 1.2 L fluid restriction per day    Chief Complaint  Patient presents with   Emesis   Hyperglycemia     Brief history of present illness from the day of admission and additional interim summary    72 y.o.  female with history of ESRD on HD TTS, DM-2, PAF, CAD, HFrEF, HTN, PAD s/p right BKA-presented with nausea/vomiting in the setting of hyperosmolar hyperglycemic state.  Hospital course complicated by intractable nausea and vomiting-see below for further details.   Significant events: 9/12>> admit to [facility]   Significant studies: 9/14>> CT abdomen: No SBO   Significant microbiology data: None   Procedures: 9/19>> EGD: Normal esophagus, bilious gastric fluid                                                                 Hospital Course   DM type II with initial hyperosmolar hyperglycemic state thereafter brief DKA. Insulin dose adjusted DKA resolved continue to monitor CBGs closely, avoiding increasing insulin as oral intake is extremely inconsistent due to underlying nausea.  With improvement in nausea, and more consistent oral intake insulin dose adjusted with better control with CBGs.  Continue home regimen upon  discharge with outpatient PCP monitoring.  Nausea/vomiting likely due to gastroparesis flare Unfortunately continues to have intermittent nausea and vomiting-in spite of being on scheduled antiemetics.  Apparently she has had this issue for several years and exacerbates quite frequently, she was seen by GI team, CT abdomen pelvis was nonacute, she underwent EGD on 02/12/2023 which was unremarkable as well.  She was eventually placed on Compazine as needed with good improvement on which she will be discharged, currently she is tolerating diet for close to 24 hours and eager to go home.  Will be dialyzed today thereafter discharge with outpatient follow-up with her PCP and gastroenterologist Dr. Loreta Ave.    Asymptomatic bacteriuria UA dirty but no symptoms-makes very little urine No longer on Cefepime-UTI ruled out.   Chronic HFrEF Volume removal with HD   CAD No anginal symptoms Blocker/statin Suspect not on ASA due to being on Eliquis.   PAF Telemetry monitoring Coreg/amiodarone Eliquis briefly held for EGD but has since been resumed.  Susan [name] [facility] NGE:[id number] DOB: February 20, 1951 DOA: 02/05/2023  PCP: Donita Brooks, MD  Admit date: 02/05/2023  Discharge date: 02/17/2023  Admitted From: Home   Disposition:  Home   Recommendations for Outpatient Follow-up:   Follow up with PCP in 1-2 weeks  PCP Please obtain BMP/CBC, 2 view CXR in 1week,  (see Discharge instructions)   PCP Please follow up on the following pending results:    Home Health: PT, RN, social work Equipment/Devices: walker  Consultations: Renal, GI Discharge Condition: Stable    CODE STATUS: Full    Diet Recommendation: Renal-low carbohydrate diet with 1.2 L fluid restriction per day    Chief Complaint  Patient presents with   Emesis   Hyperglycemia     Brief history of present illness from the day of admission and additional interim summary    72 y.o.  female with history of ESRD on HD TTS, DM-2, PAF, CAD, HFrEF, HTN, PAD s/p right BKA-presented with nausea/vomiting in the setting of hyperosmolar hyperglycemic state.  Hospital course complicated by intractable nausea and vomiting-see below for further details.   Significant events: 9/12>> admit to [facility]   Significant studies: 9/14>> CT abdomen: No SBO   Significant microbiology data: None   Procedures: 9/19>> EGD: Normal esophagus, bilious gastric fluid                                                                 Hospital Course   DM type II with initial hyperosmolar hyperglycemic state thereafter brief DKA. Insulin dose adjusted DKA resolved continue to monitor CBGs closely, avoiding increasing insulin as oral intake is extremely inconsistent due to underlying nausea.  With improvement in nausea, and more consistent oral intake insulin dose adjusted with better control with CBGs.  Continue home regimen upon  discharge with outpatient PCP monitoring.  Nausea/vomiting likely due to gastroparesis flare Unfortunately continues to have intermittent nausea and vomiting-in spite of being on scheduled antiemetics.  Apparently she has had this issue for several years and exacerbates quite frequently, she was seen by GI team, CT abdomen pelvis was nonacute, she underwent EGD on 02/12/2023 which was unremarkable as well.  She was eventually placed on Compazine as needed with good improvement on which she will be discharged, currently she is tolerating diet for close to 24 hours and eager to go home.  Will be dialyzed today thereafter discharge with outpatient follow-up with her PCP and gastroenterologist Dr. Loreta Ave.    Asymptomatic bacteriuria UA dirty but no symptoms-makes very little urine No longer on Cefepime-UTI ruled out.   Chronic HFrEF Volume removal with HD   CAD No anginal symptoms Blocker/statin Suspect not on ASA due to being on Eliquis.   PAF Telemetry monitoring Coreg/amiodarone Eliquis briefly held for EGD but has since been resumed.  Musculoskeletal: Degenerative changes of the visualized thoracolumbar spine. IMPRESSION: No evidence of bowel obstruction. Cholelithiasis, without associated inflammatory changes. Small bilateral pleural effusions associated bilateral lower lobe atelectasis. Electronically Signed   By: Charline Bills M.D.   On: 02/07/2023 18:25   VAS [name] ABI WITH/WO TBI  Result Date: 02/02/2023  LOWER EXTREMITY DOPPLER STUDY Patient Name:  ZEAH VANCLEAVE  Date of Exam:   02/02/2023 Medical Rec #: [id number]       Accession #:    [id number] Date of Birth: 02/03/1951       Patient Gender: F Patient Age:   89 years Exam Location:  [facility] Procedure:       VAS [name] ABI WITH/WO TBI Referring Phys: A POWELL JR --------------------------------------------------------------------------------  Indications: Ulceration, and peripheral artery disease. High Risk Factors: Hypertension, hyperlipidemia, Diabetes. Other Factors: ESRD on dialysis.  Vascular Interventions: Right BKA 05/02/22. Comparison Study: Prior ABI done 11/04/2017 Performing Technologist: Sherren Kerns RVS  Examination Guidelines: A complete evaluation includes at minimum, Doppler waveform signals and systolic blood pressure reading at the level of bilateral brachial, anterior tibial, and posterior tibial arteries, when vessel segments are accessible. Bilateral testing is considered an integral part of a complete examination. Photoelectric Plethysmograph (PPG) waveforms and toe systolic pressure readings are included as required and additional duplex testing as needed. Limited examinations for reoccurring indications may be performed as noted.  ABI Findings: +--------+------------------+-----+--------+--------+ Right   Rt Pressure (mmHg)IndexWaveformComment  +--------+------------------+-----+--------+--------+ [id number]                                     +--------+------------------+-----+--------+--------+ PTA                                    BKA      +--------+------------------+-----+--------+--------+ DP                                     BKA      +--------+------------------+-----+--------+--------+ +---------+------------------+-----+----------+-------+ Left     Lt Pressure (mmHg)IndexWaveform  Comment +---------+------------------+-----+----------+-------+ Brachial                                  graft   +---------+------------------+-----+----------+-------+ PTA      254               1.94 monophasic        +---------+------------------+-----+----------+-------+ DP       254               1.94 monophasic         +---------+------------------+-----+----------+-------+ Great Toe110               0.84                   +---------+------------------+-----+----------+-------+ +-------+-----------+-----------+------------+------------+ ABI/TBIToday's ABIToday's TBIPrevious ABIPrevious TBI +-------+-----------+-----------+------------+------------+ Right  BKA                   1.21        0.72         +-------+-----------+-----------+------------+------------+ Left   1.94       0.84       1.21        0.65         +-------+-----------+-----------+------------+------------+  Musculoskeletal: Degenerative changes of the visualized thoracolumbar spine. IMPRESSION: No evidence of bowel obstruction. Cholelithiasis, without associated inflammatory changes. Small bilateral pleural effusions associated bilateral lower lobe atelectasis. Electronically Signed   By: Charline Bills M.D.   On: 02/07/2023 18:25   VAS [name] ABI WITH/WO TBI  Result Date: 02/02/2023  LOWER EXTREMITY DOPPLER STUDY Patient Name:  ZEAH VANCLEAVE  Date of Exam:   02/02/2023 Medical Rec #: [id number]       Accession #:    [id number] Date of Birth: 02/03/1951       Patient Gender: F Patient Age:   89 years Exam Location:  [facility] Procedure:       VAS [name] ABI WITH/WO TBI Referring Phys: A POWELL JR --------------------------------------------------------------------------------  Indications: Ulceration, and peripheral artery disease. High Risk Factors: Hypertension, hyperlipidemia, Diabetes. Other Factors: ESRD on dialysis.  Vascular Interventions: Right BKA 05/02/22. Comparison Study: Prior ABI done 11/04/2017 Performing Technologist: Sherren Kerns RVS  Examination Guidelines: A complete evaluation includes at minimum, Doppler waveform signals and systolic blood pressure reading at the level of bilateral brachial, anterior tibial, and posterior tibial arteries, when vessel segments are accessible. Bilateral testing is considered an integral part of a complete examination. Photoelectric Plethysmograph (PPG) waveforms and toe systolic pressure readings are included as required and additional duplex testing as needed. Limited examinations for reoccurring indications may be performed as noted.  ABI Findings: +--------+------------------+-----+--------+--------+ Right   Rt Pressure (mmHg)IndexWaveformComment  +--------+------------------+-----+--------+--------+ [id number]                                     +--------+------------------+-----+--------+--------+ PTA                                    BKA      +--------+------------------+-----+--------+--------+ DP                                     BKA      +--------+------------------+-----+--------+--------+ +---------+------------------+-----+----------+-------+ Left     Lt Pressure (mmHg)IndexWaveform  Comment +---------+------------------+-----+----------+-------+ Brachial                                  graft   +---------+------------------+-----+----------+-------+ PTA      254               1.94 monophasic        +---------+------------------+-----+----------+-------+ DP       254               1.94 monophasic         +---------+------------------+-----+----------+-------+ Great Toe110               0.84                   +---------+------------------+-----+----------+-------+ +-------+-----------+-----------+------------+------------+ ABI/TBIToday's ABIToday's TBIPrevious ABIPrevious TBI +-------+-----------+-----------+------------+------------+ Right  BKA                   1.21        0.72         +-------+-----------+-----------+------------+------------+ Left   1.94       0.84       1.21        0.65         +-------+-----------+-----------+------------+------------+

## 2023-02-17 NOTE — TOC Transition Note (Signed)
Transition of Care [facility]) - CM/SW Discharge Note   Patient Details  Name: Susan [name] MRN: [id number] Date of Birth: 1950-11-26  [facility] CM/SW Contact:  Gordy Clement, RN Phone Number: 02/17/2023, 9:15 AM   Clinical Narrative:     Patient to DC to home today . [facility] will provide home health.  [employer] provided a drop arm bedside commode . Husband to transport and will pick her up after HD.  AVS updated  No additional TOC needs         Patient Goals and CMS Choice      Discharge Placement                         Discharge Plan and Services Additional resources added to the After Visit Summary for     Discharge Planning Services: CM Consult                                 Social Determinants of Health (SDOH) Interventions SDOH Screenings   Food Insecurity: No Food Insecurity (02/11/2023)  Housing: Low Risk  (02/11/2023)  Transportation Needs: No Transportation Needs (02/11/2023)  Utilities: Not At Risk (02/11/2023)  Alcohol Screen: Low Risk  (08/16/2021)  Depression (PHQ2-9): Low Risk  (09/29/2022)  Financial Resource Strain: Low Risk  (08/16/2021)  Physical Activity: Inactive (08/16/2021)  Social Connections: Moderately Isolated (08/16/2021)  Stress: No Stress Concern Present (08/16/2021)  Tobacco Use: Medium Risk (02/12/2023)     Readmission Risk Interventions    01/21/2023    3:09 PM 09/04/2022    5:11 PM 05/12/2022    5:04 PM  Readmission Risk Prevention Plan  Transportation Screening Complete Complete Complete  Medication Review (RN Care Manager) Referral to Pharmacy Complete   PCP or Specialist appointment within 3-5 days of discharge Complete Complete   HRI or Home Care Consult Complete Complete   SW Recovery Care/Counseling Consult Complete Complete Complete  Palliative Care Screening Not Applicable Not Applicable   Skilled Nursing Facility Not Applicable Complete Complete

## 2023-02-17 NOTE — Progress Notes (Signed)
Physical Therapy Treatment Patient Details Name: Susan [name] MRN: [id number] DOB: May 15, 1951 Today's Date: 02/17/2023   History of Present Illness 72 y.o. female admitted 9/12 with nausea and vomiting in setting of hyperosmolar hyperglycemic state. PMHx: Pt recently admitted 8/22-8/24 and 8/27-9/9 for similar symptoms, found to have proctocolitis as well as early DKA. OA, CAD, chronic diastolic HF, depression, ESRD on HD TTS, HTN, HLD, CVA, T2DM, Rt BKA.    PT Comments  Pt received in supine, spouse just arriving to room, pt preparing for DC and agreeable to therapy session for cues on safety/improved body mechanics with slide board transfers with caregiver present to assist. Pt needing up to modA (heavy) +2 safety with PTA providing mostly verbal/tactile cues for safer body mechanics/positioning and spouse providing lift assist via gait belt to aid pt in sliding. Pt with poor recall of safe UE/LE placement from previous sessions. Pt using hospital bed features to assist in raising trunk initially to get to EOB. RN arriving to remove her IV at end of session. Pt continues to benefit from PT services to progress toward functional mobility goals.     If plan is discharge home, recommend the following: Assistance with cooking/housework;Assist for transportation;Help with stairs or ramp for entrance;Two people to help with walking and/or transfers;A lot of help with bathing/dressing/bathroom;Supervision due to cognitive status   Can travel by private vehicle      (recommend wheelchair [name])  Equipment Recommendations   (pt already has slide board)    Recommendations for Other Services       Precautions / Restrictions Precautions Precautions: Fall;Other (comment) Precaution Comments: R BKA, LLE wound Restrictions Weight Bearing Restrictions: No Other Position/Activity Restrictions: per pt chart she reports Dr. Lajoyce Corners does not want her to put any weight on her LLE due to skin graft - sent  message to Dr. Lajoyce Corners to clarify on 02/06/23 at 10:27 AM on 02/06/23 Dr. Lajoyce Corners states pt is full WB     Mobility  Bed Mobility Overal bed mobility: Needs Assistance Bed Mobility: Rolling, Sidelying to Sit Rolling: Contact guard assist Sidelying to sit: Min assist, HOB elevated, Used rails       General bed mobility comments: increased time, cues for sequencing    Transfers Overall transfer level: Needs assistance Equipment used: Sliding board Transfers: Bed to chair/wheelchair/BSC            Lateral/Scoot Transfers: Mod assist, +2 physical assistance, With slide board General transfer comment: lateral scoot bed to wheelchair with slide board toward her R side. Cues for sequencing. Assist using plastic bag over board/ under hips per spouse request. Pt also had pants donned prior to reduce risk of chafing. Pt spouse providing majority of lift and slide assist via face to face posture and gait belt, PTA assisting for pt better body mechanics and to stabilize slide board.    Ambulation/Gait                   Psychologist, counselling parts: Needs Engineer, materials Details (indicate cue type and reason): PTA and spouse providing assist for placement/locking/arm rest removal and replacement, pt verbally cued for technique.   Tilt Bed    Modified Rankin (Stroke Patients Only)       Balance Overall balance assessment: Needs assistance Sitting-balance support: No upper extremity supported, Feet supported, Bilateral upper extremity supported Sitting balance-Leahy Scale: Good Sitting balance - Comments: sitting  EOB       Standing balance comment: defer, pt performs slide board transfer to wheelchair for car/home transfers                            Cognition Arousal: Alert Behavior During Therapy: [facility] for tasks assessed/performed Overall Cognitive Status: Within Functional Limits for tasks  assessed                                 General Comments: slow processing but agreeable, excited to go home per pt        Exercises      General Comments General comments (skin integrity, edema, etc.): PTA assisted to remove tele stickers/wires upon arrival as pt preparing for DC. RN arriving to remove peripheral IV at end of session. Spouse present to transport her home after session via wheelchair to private vehicle.      Pertinent Vitals/Pain Pain Assessment Pain Assessment: No/denies pain Pain Intervention(s): Monitored during session, Repositioned    Home Living                          Prior Function            PT Goals (current goals can now be found in the care plan section) Acute Rehab PT Goals Patient Stated Goal: Return home with HHPT PT Goal Formulation: With patient Time For Goal Achievement: 02/20/23 Progress towards PT goals: Progressing toward goals    Frequency    Min 1X/week      PT Plan      Co-evaluation              AM-PAC PT "6 Clicks" Mobility   Outcome Measure  Help needed turning from your back to your side while in a flat bed without using bedrails?: A Little Help needed moving from lying on your back to sitting on the side of a flat bed without using bedrails?: A Lot Help needed moving to and from a bed to a chair (including a wheelchair)?: Total (+2 assist for safety recommended) Help needed standing up from a chair using your arms (e.g., wheelchair or bedside chair)?: Total Help needed to walk in hospital room?: Total Help needed climbing 3-5 steps with a railing? : Total 6 Click Score: 9    End of Session Equipment Utilized During Treatment: Gait belt;Other (comment) (RA throughout, no significant c/o dyspnea) Activity Tolerance: Patient tolerated treatment well Patient left: in chair;with call bell/phone within reach;with family/visitor present;with nursing/sitter in room (personal  wheelchair) Nurse Communication: Mobility status PT Visit Diagnosis: Other abnormalities of gait and mobility (R26.89);Muscle weakness (generalized) (M62.81)     Time: 1610-9604 PT Time Calculation (min) (ACUTE ONLY): 13 min  Charges:    $Therapeutic Activity: 8-22 mins PT General Charges $$ ACUTE PT VISIT: 1 Visit                     Loralee Weitzman P., PTA Acute Rehabilitation Services Secure Chat Preferred 9a-5:30pm Office: [phone]    Dorathy Kinsman [facility] 02/17/2023, 4:17 PM

## 2023-02-17 NOTE — Progress Notes (Signed)
D/C order noted. Contacted [facility] to advise clinic of pt's d/c today and that pt should resume care on Thursday.   Olivia Canter Renal Navigator [phone]

## 2023-02-17 NOTE — Progress Notes (Signed)
   02/17/23 1215  Vitals  Temp 98.6 F (37 C)  Pulse Rate 75  Resp 16  BP (!) 154/69  SpO2 100 %  Post Treatment  Dialyzer Clearance Clear  Hemodialysis Intake (mL) 0 mL  Liters Processed 84  Fluid Removed (mL) 2000 mL  Tolerated HD Treatment Yes   Received patient in bed to unit.  Alert and oriented.  Informed consent signed and in chart.   TX duration:3.5HRS  Patient tolerated well.  Transported back to the room  Alert, without acute distress.  Hand-off given to patient's nurse.   Access used: [facility] Access issues: NONE  Total UF removed: 2L Medication(s) given: NONE    [name]

## 2023-02-17 NOTE — Progress Notes (Signed)
PT Cancellation Note  Patient Details Name: Susan [name] MRN: [id number] DOB: 1950-10-15   Cancelled Treatment:    Reason Eval/Treat Not Completed: (P) Patient at procedure or test/unavailable (off floor at HD dept, plan to DC after HD per chart review.) Will continue efforts per PT plan of care as schedule permits.   [name] M Davione Lenker 02/17/2023, 10:18 AM

## 2023-02-17 NOTE — Plan of Care (Signed)
[facility] Patient Discharge Orders- [facility] CLINIC: [facility]  Patient's name: Susan [name] Admit/DC Dates: 02/05/2023 - 02/17/2023  Discharge Diagnoses: Nausea/vomiting, likely gastroparesis   T2DM with DKA    Aranesp: Given: yes    Date and amount of last dose: 9/21 25 mcg    Last Hgb: 10.1 PRBC's Given: -- Date/# of units: -- ESA dose for discharge: per protocol  IV Iron dose at discharge: --  Heparin change: --  EDW Change: 74 kg   Bath Change: --  Access intervention/Change: --   Hectorol/Calcitriol change: --  Discharge Labs: Calcium  8.8 Phosphorus -- Albumin 2.6 K+ 3.8  IV Antibiotics: -- -  On Coumadin?: --    OTHER/APPTS/LAB ORDERS:    D/C Meds to be reconciled by nurse after every discharge.  Completed By: Tomasa Blase PA-C   Reviewed by: MD:______ RN_______

## 2023-02-17 NOTE — Procedures (Signed)
Patient was seen on dialysis and the procedure was supervised.  BFR 400  Via TDC BP is  147/73.   Patient appears to be tolerating treatment well. N/V has improved.   Susan [name] Jaynie Collins 02/17/2023

## 2023-02-19 DIAGNOSIS — D689 Coagulation defect, unspecified: Secondary | ICD-10-CM | POA: Diagnosis not present

## 2023-02-19 DIAGNOSIS — Z992 Dependence on renal dialysis: Secondary | ICD-10-CM | POA: Diagnosis not present

## 2023-02-19 DIAGNOSIS — N2581 Secondary hyperparathyroidism of renal origin: Secondary | ICD-10-CM | POA: Diagnosis not present

## 2023-02-19 DIAGNOSIS — N186 End stage renal disease: Secondary | ICD-10-CM | POA: Diagnosis not present

## 2023-02-19 DIAGNOSIS — E1122 Type 2 diabetes mellitus with diabetic chronic kidney disease: Secondary | ICD-10-CM | POA: Diagnosis not present

## 2023-02-19 DIAGNOSIS — D509 Iron deficiency anemia, unspecified: Secondary | ICD-10-CM | POA: Diagnosis not present

## 2023-02-19 DIAGNOSIS — D631 Anemia in chronic kidney disease: Secondary | ICD-10-CM | POA: Diagnosis not present

## 2023-02-20 ENCOUNTER — Ambulatory Visit: Payer: PPO | Admitting: Family

## 2023-02-20 ENCOUNTER — Other Ambulatory Visit: Payer: Self-pay | Admitting: Vascular Surgery

## 2023-02-20 DIAGNOSIS — Z89511 Acquired absence of right leg below knee: Secondary | ICD-10-CM | POA: Diagnosis not present

## 2023-02-20 DIAGNOSIS — I87332 Chronic venous hypertension (idiopathic) with ulcer and inflammation of left lower extremity: Secondary | ICD-10-CM

## 2023-02-20 DIAGNOSIS — Z992 Dependence on renal dialysis: Secondary | ICD-10-CM

## 2023-02-20 DIAGNOSIS — S88111D Complete traumatic amputation at level between knee and ankle, right lower leg, subsequent encounter: Secondary | ICD-10-CM

## 2023-02-23 ENCOUNTER — Encounter: Payer: PPO | Admitting: Orthopedic Surgery

## 2023-02-23 ENCOUNTER — Inpatient Hospital Stay: Payer: PPO | Admitting: Physical Medicine & Rehabilitation

## 2023-02-23 DIAGNOSIS — N179 Acute kidney failure, unspecified: Secondary | ICD-10-CM | POA: Diagnosis not present

## 2023-02-23 DIAGNOSIS — M6281 Muscle weakness (generalized): Secondary | ICD-10-CM | POA: Diagnosis not present

## 2023-02-23 DIAGNOSIS — N186 End stage renal disease: Secondary | ICD-10-CM | POA: Diagnosis not present

## 2023-02-23 DIAGNOSIS — K626 Ulcer of anus and rectum: Secondary | ICD-10-CM | POA: Diagnosis not present

## 2023-02-23 DIAGNOSIS — I5043 Acute on chronic combined systolic (congestive) and diastolic (congestive) heart failure: Secondary | ICD-10-CM | POA: Diagnosis not present

## 2023-02-23 DIAGNOSIS — K219 Gastro-esophageal reflux disease without esophagitis: Secondary | ICD-10-CM | POA: Diagnosis not present

## 2023-02-23 DIAGNOSIS — I5042 Chronic combined systolic (congestive) and diastolic (congestive) heart failure: Secondary | ICD-10-CM | POA: Diagnosis not present

## 2023-02-23 DIAGNOSIS — J9611 Chronic respiratory failure with hypoxia: Secondary | ICD-10-CM | POA: Diagnosis not present

## 2023-02-23 DIAGNOSIS — Z992 Dependence on renal dialysis: Secondary | ICD-10-CM | POA: Diagnosis not present

## 2023-02-24 ENCOUNTER — Other Ambulatory Visit (HOSPITAL_COMMUNITY): Payer: Self-pay

## 2023-02-24 ENCOUNTER — Encounter: Payer: Self-pay | Admitting: Family

## 2023-02-24 DIAGNOSIS — D689 Coagulation defect, unspecified: Secondary | ICD-10-CM | POA: Diagnosis not present

## 2023-02-24 DIAGNOSIS — E876 Hypokalemia: Secondary | ICD-10-CM | POA: Diagnosis not present

## 2023-02-24 DIAGNOSIS — Z992 Dependence on renal dialysis: Secondary | ICD-10-CM | POA: Diagnosis not present

## 2023-02-24 DIAGNOSIS — N2581 Secondary hyperparathyroidism of renal origin: Secondary | ICD-10-CM | POA: Diagnosis not present

## 2023-02-24 DIAGNOSIS — D509 Iron deficiency anemia, unspecified: Secondary | ICD-10-CM | POA: Diagnosis not present

## 2023-02-24 DIAGNOSIS — N186 End stage renal disease: Secondary | ICD-10-CM | POA: Diagnosis not present

## 2023-02-24 NOTE — Progress Notes (Signed)
Office Visit Note   Patient: Susan [name]           Date of Birth: 27-Jun-1950           MRN: [id number] Visit Date: 02/20/2023              Requested by: Donita Brooks, MD [address] [place],  [place] [address] PCP: Donita Brooks, MD  Chief Complaint  Patient presents with   Left Leg - Follow-up    registry pt #[id number]      HPI: The patient is a 72 year old woman seen in follow-up for venous ulcer to her left leg.  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  She has had recent hospitalizations since her last office visit x 3.  Has been having dry dressing changes to her left leg ulcer  Assessment & Plan: Visit Diagnoses: No diagnosis found.  Plan: Pleased with healing of her wound.  She will continue close monitoring daily dose of cleansing and follow-up as needed.  Follow-Up Instructions: No follow-ups on file.   Ortho Exam  Patient is alert, oriented, no adenopathy, well-dressed, normal affect, normal respiratory effort. On examination of the right leg she has a stable transtibial amputation without erythema warmth or open area.  The left lower extremity she has a well-healed venous ulcer laterally there is no open area no drainage no erythema  Imaging: No results found.   Labs: Lab Results  Component Value Date   HGBA1C 7.9 (H) 08/27/2022   HGBA1C 8.9 (H) 05/02/2022   HGBA1C >15.5 (H) 01/20/2022   ESRSEDRATE 17 11/10/2022   CRP 3.6 (H) 11/10/2022   CRP 0.6 09/01/2022   CRP 0.7 08/22/2020   LABURIC 4.7 08/09/2020   LABURIC 9.1 (H) 10/12/2018   LABURIC 9.0 (H) 03/06/2018   REPTSTATUS 11/19/2022 FINAL 11/14/2022   GRAMSTAIN  11/14/2022    MODERATE WBC PRESENT,BOTH PMN AND MONONUCLEAR RARE SQUAMOUS EPITHELIAL CELLS PRESENT NO ORGANISMS SEEN Performed at [facility], [address] [place], [place] [address]    CULT  11/14/2022    FEW PROTEUS MIRABILIS RARE KLEBSIELLA PNEUMONIAE NO ANAEROBES ISOLATED FEW ENTEROCOCCUS  FAECALIS    LABORGA PROTEUS MIRABILIS 11/14/2022   LABORGA KLEBSIELLA PNEUMONIAE 11/14/2022   LABORGA ENTEROCOCCUS FAECALIS 11/14/2022     Lab Results  Component Value Date   ALBUMIN 2.6 (L) 02/17/2023   ALBUMIN 2.8 (L) 02/15/2023   ALBUMIN 2.9 (L) 02/14/2023   PREALBUMIN 12 (L) 11/10/2022   PREALBUMIN 13 (L) 05/02/2022    Lab Results  Component Value Date   MG 1.6 (L) 02/15/2023   MG 1.9 02/14/2023   MG 2.3 02/12/2023   Lab Results  Component Value Date   VD25OH 42.14 05/02/2022    Lab Results  Component Value Date   PREALBUMIN 12 (L) 11/10/2022   PREALBUMIN 13 (L) 05/02/2022      Latest Ref Rng & Units 02/17/2023    9:17 AM 02/15/2023   10:43 AM 02/14/2023    7:02 AM  CBC EXTENDED  WBC 4.0 - 10.5 K/uL 5.2  6.7  7.1   RBC 3.87 - 5.11 MIL/uL 4.12  4.58  4.18   Hemoglobin 12.0 - 15.0 g/dL 13.2  44.0  9.7   HCT 10.2 - 46.0 % 33.4  35.7  33.0   Platelets 150 - 400 K/uL 186  196  191      There is no height or weight on file to calculate BMI.  Orders:  No orders of the defined types were placed in this encounter.  No orders of the defined types were placed in this encounter.    Procedures: No procedures performed  Clinical Data: No additional findings.  ROS:  All other systems negative, except as noted in the HPI. Review of Systems  Objective: Vital Signs: There were no vitals taken for this visit.  Specialty Comments:  No specialty comments available.  PMFS History: Patient Active Problem List   Diagnosis Date Noted   Hyperosmolar hyperglycemic state (HHS) (HCC) 02/05/2023   Depression 01/29/2023   Nausea 01/24/2023   Intractable nausea and vomiting 01/20/2023   Proctitis 01/15/2023   Acute on chronic anemia 11/25/2022   Right below-knee amputee (HCC) 11/20/2022   Cellulitis of left lower extremity 11/14/2022   Maggot infestation 11/12/2022   Complicated wound infection 11/10/2022   Secondary hypercoagulable state (HCC) 09/04/2022    Right sided abdominal pain 08/31/2022   Constipation 06/07/2022   History of Clostridioides difficile colitis 06/06/2022   Below-knee amputation of right lower extremity (HCC) 06/06/2022   Diverticulitis 06/05/2022   Stercoral colitis 06/05/2022   C. 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) 04/07/2022   Allergy, unspecified, initial encounter 04/07/2022   Dependence on renal dialysis (HCC) 04/07/2022   Gout due to renal impairment, unspecified site 04/07/2022   Hypertensive heart and chronic kidney disease with heart failure and with stage 5 chronic kidney disease, or end stage renal disease (HCC) 04/07/2022   Personal history of transient ischemic attack (TIA), and cerebral infarction without residual deficits 04/07/2022   Renal osteodystrophy 04/07/2022   Venous stasis ulcer of right calf (HCC) 03/31/2022   Fistula, colovaginal 03/26/2022   Diarrhea 03/26/2022   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   Sepsis without acute organ dysfunction (HCC)    Bacteremia    Acute pancreatitis 02/01/2022   Abdominal pain 02/01/2022   SIRS (systemic inflammatory response syndrome) (HCC) 02/01/2022   Transaminitis 02/01/2022   History of anemia due to chronic kidney disease 02/01/2022   Paroxysmal atrial fibrillation (HCC) 02/01/2022   Uncontrolled type 2 diabetes mellitus with hyperglycemia, with long-term current use of insulin (HCC)  01/14/2022   NSTEMI (non-ST elevated myocardial infarction) (HCC) 03/05/2021   Acute renal failure superimposed on stage 4 chronic kidney disease (HCC) 08/22/2020   Hypoalbuminemia 05/25/2020   GERD (gastroesophageal reflux disease) 05/25/2020   Pressure injury of skin 05/17/2020   Acute on chronic combined systolic and diastolic congestive heart failure (HCC) 03/07/2020   Type 2 diabetes mellitus with diabetic polyneuropathy, with long-term current use of insulin (HCC) 03/07/2020   Obesity, Class III, BMI 40-49.9 (morbid obesity) (HCC) 03/07/2020   Common bile duct (CBD) obstruction 05/28/2019   Benign neoplasm of ascending colon    Benign neoplasm of transverse colon    Benign neoplasm of descending colon    Benign neoplasm of sigmoid colon    Gastric polyps    Hyperkalemia 03/11/2019   Prolonged QT interval 03/11/2019   Acute  blood loss anemia 03/11/2019   Onychomycosis 06/21/2018   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)    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 (HCC)    PVD (peripheral vascular disease) (HCC) 10/26/2017   E-coli UTI 07/27/2017   Hypothyroidism 07/27/2017   AKI (acute kidney injury) (HCC)    PAH (pulmonary artery hypertension) (HCC)    Impaired ambulation 07/19/2017   Nausea & vomiting 07/15/2017   Leg cramps 02/27/2017   Peripheral edema 01/12/2017   Diabetic neuropathy (HCC) 11/12/2016   CKD (chronic kidney disease), stage IV (HCC) 10/24/2015   Anemia of chronic disease 10/03/2015   Historical diagnosis of generalized anxiety disorder 10/03/2015   Secondary insomnia 10/03/2015   Hyperglycemia due to diabetes mellitus (HCC) 06/07/2015   Non compliance with medical treatment 04/17/2014   Rotator cuff tear 03/14/2014   Obesity 09/23/2013   Chronic HFrEF (heart failure with reduced ejection fraction) (HCC) 06/03/2013   Hypotension 12/25/2012   Hyponatremia 12/25/2012   Urinary incontinence    MDD (major depressive disorder),  recurrent episode, moderate (HCC) 11/12/2010   RBBB (right bundle branch block)    Wide-complex tachycardia    Coronary artery disease    Hyperlipemia 01/22/2009   Chronic hypotension 01/22/2009   Past Medical History:  Diagnosis Date   Anemia    hx   Anxiety    Arthritis    "generalized" (03/15/2014)   CAD (coronary artery disease)    MI in 2000 - MI  2007 - treated bare metal stent (no nuclear since then as 9/11)   Carotid artery disease (HCC)    Chronic diastolic heart failure (HCC)    a) ECHO (08/2013) EF 55-60% and RV function nl b) RHC (08/2013) RA 4, RV 30/5/7, PA 25/10 (16), PCWP 7, Fick CO/CI 6.3/2.7, PVR 1.5 WU, PA 61 and 66%   Daily headache    "~ every other day; since I fell in June" (03/15/2014)   Depression    Diabetic retinopathy (HCC)    Dyslipidemia    ESRD (end stage renal disease) (HCC)    Dialysis on Tues Thurs Sat   Exertional shortness of breath    History of kidney stones    HTN (hypertension)    Hypothyroidism    Obesity    Osteoarthritis    PAF (paroxysmal atrial fibrillation) (HCC)    Peripheral neuropathy    bilateral feet/hands   PONV (postoperative nausea and vomiting)    RBBB (right bundle branch block)    Old   Stroke (HCC)    mini strokes   Type II diabetes mellitus (HCC)    Type II, [name]. patient has omnipod insulin pump with Novolin R Insulin    Family History  Problem Relation Age of Onset   Heart attack Mother 25    Past Surgical History:  Procedure Laterality Date   A/V FISTULAGRAM Left 11/07/2022   Procedure: A/V Fistulagram;  Surgeon: Leonie Douglas, MD;  Location: [facility];  Service: Cardiovascular;  Laterality: Left;   ABDOMINAL HYSTERECTOMY  1980's   AMPUTATION Right 02/24/2018   Procedure: RIGHT FOOT GREAT TOE AND 2ND TOE AMPUTATION;  Surgeon: Nadara Mustard, MD;  Location: [facility] OR;  Service: Orthopedics;  Laterality: Right;   AMPUTATION Right 04/30/2018   Procedure: RIGHT TRANSMETATARSAL  AMPUTATION;  Surgeon: Nadara Mustard, MD;  Location: [facility] OR;  Service: Orthopedics;  Laterality: Right;   AMPUTATION Right 05/02/2022   Procedure: RIGHT BELOW KNEE AMPUTATION;  Surgeon: Nadara Mustard, MD;  Location: [facility] OR;  Service: Orthopedics;  Laterality: Right;   APPLICATION OF WOUND VAC Right 06/13/2022   Procedure: APPLICATION OF WOUND VAC;  Surgeon: Nadara Mustard, MD;  Location: [facility] OR;  Service: Orthopedics;  Laterality: Right;   APPLICATION OF WOUND VAC Left 11/14/2022   Procedure: APPLICATION OF WOUND VAC;  Surgeon: Nadara Mustard, MD;  Location: [facility] OR;  Service: Orthopedics;  Laterality: Left;   AV FISTULA PLACEMENT Left 04/02/2022   Procedure: LEFT ARM ARTERIOVENOUS (AV) FISTULA CREATION;  Surgeon: Nada Libman, MD;  Location: [facility] OR;  Service: Vascular;  Laterality: Left;  PERIPHERAL NERVE BLOCK   BASCILIC VEIN TRANSPOSITION Left 07/31/2022   Procedure: LEFT ARM SECOND STAGE BASILIC VEIN TRANSPOSITION;  Surgeon: Nada Libman, MD;  Location: [facility] OR;  Service: Vascular;  Laterality: Left;   BIOPSY  05/27/2020   Procedure: BIOPSY;  Surgeon: Lanelle Bal, DO;  Location: [facility];  Service: Endoscopy;;   CATARACT EXTRACTION, BILATERAL Bilateral ?2013   COLONOSCOPY W/ POLYPECTOMY     COLONOSCOPY WITH PROPOFOL N/A 03/13/2019   Procedure: COLONOSCOPY WITH PROPOFOL;  Surgeon: Beverley Fiedler, MD;  Location: [facility] ENDOSCOPY;  Service: Gastroenterology;  Laterality: N/A;   CORONARY ANGIOPLASTY WITH STENT PLACEMENT  1999; 2007   "1 + 1"   ERCP N/A 02/03/2022   Procedure: ENDOSCOPIC RETROGRADE CHOLANGIOPANCREATOGRAPHY (ERCP);  Surgeon: Jeani Hawking, MD;  Location: [facility] ENDOSCOPY;  Service: Gastroenterology;  Laterality: N/A;   ESOPHAGOGASTRODUODENOSCOPY N/A 02/12/2023   Procedure: ESOPHAGOGASTRODUODENOSCOPY (EGD);  Surgeon: Jeani Hawking, MD;  Location: [facility] ENDOSCOPY;  Service: Gastroenterology;  Laterality: N/A;   ESOPHAGOGASTRODUODENOSCOPY (EGD) WITH PROPOFOL N/A 03/13/2019   Procedure:  ESOPHAGOGASTRODUODENOSCOPY (EGD) WITH PROPOFOL;  Surgeon: Beverley Fiedler, MD;  Location: [facility] ENDOSCOPY;  Service: Gastroenterology;  Laterality: N/A;   ESOPHAGOGASTRODUODENOSCOPY (EGD) WITH PROPOFOL N/A 05/27/2020   Procedure: ESOPHAGOGASTRODUODENOSCOPY (EGD) WITH PROPOFOL;  Surgeon: Lanelle Bal, DO;  [facility];  Service: Endoscopy;  Laterality: N/A;   ESOPHAGOGASTRODUODENOSCOPY (EGD) WITH PROPOFOL N/A 09/03/2022   Procedure: ESOPHAGOGASTRODUODENOSCOPY (EGD) WITH PROPOFOL;  Surgeon: Jeani Hawking, MD;  Location: [facility] ENDOSCOPY;  Service: Gastroenterology;  Laterality: N/A;   EYE SURGERY Bilateral    lazer   FLEXIBLE SIGMOIDOSCOPY N/A 05/23/2022   Procedure: FLEXIBLE SIGMOIDOSCOPY;  Surgeon: Jeani Hawking, MD;  Location: [facility] ENDOSCOPY;  Service: Gastroenterology;  Laterality: N/A;   FLEXIBLE SIGMOIDOSCOPY N/A 05/24/2022   Procedure: FLEXIBLE SIGMOIDOSCOPY;  Surgeon: Imogene Burn, MD;  Location: [facility] ENDOSCOPY;  Service: Gastroenterology;  Laterality: N/A;   FLEXIBLE SIGMOIDOSCOPY N/A 09/03/2022   Procedure: FLEXIBLE SIGMOIDOSCOPY;  Surgeon: Jeani Hawking, MD;  Location: [facility] ENDOSCOPY;  Service: Gastroenterology;  Laterality: N/A;   HEMOSTASIS CLIP PLACEMENT  03/13/2019   Procedure: HEMOSTASIS CLIP PLACEMENT;  Surgeon: Beverley Fiedler, MD;  Location: [facility] ENDOSCOPY;  Service: Gastroenterology;;   HEMOSTASIS CLIP PLACEMENT  05/23/2022   Procedure: HEMOSTASIS CLIP PLACEMENT;  Surgeon: Jeani Hawking, MD;  Location: [facility] ENDOSCOPY;  Service: Gastroenterology;;   HEMOSTASIS CONTROL  05/24/2022   Procedure: HEMOSTASIS CONTROL;  Surgeon: Imogene Burn, MD;  Location: [facility] ENDOSCOPY;  Service: Gastroenterology;;   HOT HEMOSTASIS N/A 05/23/2022   Procedure: HOT HEMOSTASIS (ARGON PLASMA COAGULATION/BICAP);  Surgeon: Jeani Hawking, MD;  Location: [facility] ENDOSCOPY;  Service: Gastroenterology;  Laterality: N/A;   I & D EXTREMITY Left 05/05/2022   Procedure: IRRIGATION AND DEBRIDEMENT LEFT ARM AV FISTULA;   Surgeon: Cephus Shelling, MD;  Location: [facility] OR;  Service: Vascular;  Laterality: Left;  I & D EXTREMITY N/A 11/14/2022   Procedure: IRRIGATION AND DEBRIDEMENT OF LOWER EXTREMITY WOUND;  Surgeon: Nadara Mustard, MD;  Location: [facility] OR;  Service: Orthopedics;  Laterality: N/A;   INSERTION OF DIALYSIS CATHETER Right 04/02/2022   Procedure: INSERTION OF TUNNELED DIALYSIS CATHETER;  Surgeon: Nada Libman, MD;  Location: [facility] OR;  Service: Vascular;  Laterality: Right;   KNEE ARTHROSCOPY Left 10/25/2006   POLYPECTOMY  03/13/2019   Procedure: POLYPECTOMY;  Surgeon: Beverley Fiedler, MD;  Location: [facility] ENDOSCOPY;  Service: Gastroenterology;;   REMOVAL OF STONES  02/03/2022   Procedure: REMOVAL OF STONES;  Surgeon: Jeani Hawking, MD;  Location: [facility] ENDOSCOPY;  Service: Gastroenterology;;   REVISON OF ARTERIOVENOUS FISTULA Left 08/20/2022   Procedure: REVISON OF LEFT ARM ARTERIOVENOUS FISTULA;  Surgeon: Nada Libman, MD;  Location: [facility] OR;  Service: Vascular;  Laterality: Left;   RIGHT HEART CATH N/A 07/24/2017   Procedure: RIGHT HEART CATH;  Surgeon: Dolores Patty, MD;  Location: [facility];  Service: Cardiovascular;  Laterality: N/A;   RIGHT HEART CATHETERIZATION N/A 09/22/2013   Procedure: RIGHT HEART CATH;  Surgeon: Dolores Patty, MD;  Location: [facility] CATH LAB;  Service: Cardiovascular;  Laterality: N/A;   SHOULDER ARTHROSCOPY WITH OPEN ROTATOR CUFF REPAIR Right 03/14/2014   Procedure: RIGHT SHOULDER ARTHROSCOPY WITH BICEPS RELEASE, OPEN SUBSCAPULA REPAIR, OPEN SUPRASPINATUS REPAIR.;  Surgeon: Cammy Copa, MD;  Location: [facility] OR;  Service: Orthopedics;  Laterality: Right;   SPHINCTEROTOMY  02/03/2022   Procedure: SPHINCTEROTOMY;  Surgeon: Jeani Hawking, MD;  Location: [facility] ENDOSCOPY;  Service: Gastroenterology;;   STUMP REVISION Right 06/13/2022   Procedure: REVISION RIGHT BELOW KNEE AMPUTATION;  Surgeon: Nadara Mustard, MD;  Location: [facility] OR;  Service: Orthopedics;  Laterality: Right;    TEE WITHOUT CARDIOVERSION N/A 02/04/2022   Procedure: TRANSESOPHAGEAL ECHOCARDIOGRAM (TEE);  Surgeon: Dolores Patty, MD;  Location: [facility] ENDOSCOPY;  Service: Cardiovascular;  Laterality: N/A;   THROMBECTOMY W/ EMBOLECTOMY Left 08/20/2022   Procedure: THROMBECTOMY OF LEFT ARM ARTERIOVENOUS FISTULA;  Surgeon: Nada Libman, MD;  Location: [facility] OR;  Service: Vascular;  Laterality: Left;   TOE AMPUTATION Right 02/24/2018   GREAT TOE AND 2ND TOE AMPUTATION   TUBAL LIGATION  1970's   Social History   Occupational History    Employer: UNEMPLOYED  Tobacco Use   Smoking status: Former    Current packs/day: 0.00    Average packs/day: 3.0 packs/day for 32.0 years (96.0 ttl pk-yrs)    Types: Cigarettes    Start date: 10/24/1965    Quit date: 10/24/1997    Years since quitting: 25.3   Smokeless tobacco: Never  Vaping Use   Vaping status: Never Used  Substance and Sexual Activity   Alcohol use: Not Currently    Comment: "might have 2-3 daiquiris in the summer"   Drug use: No   Sexual activity: Not Currently    Birth control/protection: Surgical    Comment: Hysterectomy

## 2023-02-27 ENCOUNTER — Ambulatory Visit (INDEPENDENT_AMBULATORY_CARE_PROVIDER_SITE_OTHER): Payer: PPO | Admitting: Family Medicine

## 2023-02-27 ENCOUNTER — Encounter: Payer: Self-pay | Admitting: Family Medicine

## 2023-02-27 VITALS — BP 124/68 | HR 80 | Temp 98.1°F

## 2023-02-27 DIAGNOSIS — R9431 Abnormal electrocardiogram [ECG] [EKG]: Secondary | ICD-10-CM

## 2023-02-27 DIAGNOSIS — K3184 Gastroparesis: Secondary | ICD-10-CM

## 2023-02-27 DIAGNOSIS — E1165 Type 2 diabetes mellitus with hyperglycemia: Secondary | ICD-10-CM | POA: Diagnosis not present

## 2023-02-27 MED ORDER — LORAZEPAM 0.5 MG PO TABS: 0.5000 mg | ORAL_TABLET | Freq: Every day | ORAL | 2 refills | Status: DC

## 2023-02-27 NOTE — Progress Notes (Signed)
Subjective:   Expand All Collapse All                                                                                                                                                                                Susan [name] [facility] WUJ:[id number] DOB: 11/08/1950 DOA: 02/05/2023   PCP: Donita Brooks, MD   Admit date: 02/05/2023  Discharge date: 02/17/2023     Chief Complaint  Patient presents with   Emesis   Hyperglycemia      Brief history of present illness from the day of admission and additional interim summary     72 y.o.  female with history of ESRD on HD TTS, DM-2, PAF, CAD, HFrEF, HTN, PAD s/p right BKA-presented with nausea/vomiting in the setting of hyperosmolar hyperglycemic state.  Hospital course complicated by intractable nausea and vomiting-see below for further details.                                                                   Hospital Course    DM type II with initial hyperosmolar hyperglycemic state thereafter brief DKA. Insulin dose adjusted DKA resolved continue to monitor CBGs closely, avoiding increasing insulin as oral intake is extremely inconsistent due to underlying nausea.  With improvement in nausea, and more consistent oral intake insulin dose adjusted with better control with CBGs.  Continue home regimen upon discharge with outpatient PCP monitoring.  Nausea/vomiting likely due to gastroparesis flare Unfortunately continues to have intermittent nausea and vomiting-in spite of being on scheduled antiemetics.  Apparently she has had this issue for several years and exacerbates quite frequently, she was seen by GI team, CT abdomen pelvis was nonacute, she underwent EGD on 02/12/2023 which was unremarkable as well.  She was eventually placed on Compazine as needed with good improvement on which she will be discharged, currently she is tolerating diet for close to 24 hours and eager to go home.  Will be dialyzed today thereafter discharge with outpatient  follow-up with her PCP and gastroenterologist Dr. Loreta Ave.     Asymptomatic bacteriuria UA dirty but no symptoms-makes very little urine No longer on Cefepime-UTI ruled out.   Chronic HFrEF Volume removal with HD   CAD No anginal symptoms Blocker/statin Suspect not on ASA due to being on Eliquis.   PAF Telemetry monitoring Coreg/amiodarone Eliquis briefly held for EGD but has since been resumed.     ESRD on HD TTS Nephrology following, dialysis today  prior to discharge.     Normocytic anemia Secondary to CKD Follow CBC and transfuse if significant drop   PAD-s/p right BKA   Depression/anxiety Relatively stable Effexor/Remeron/Klonopin   Hypothyroidism Synthroid TSH on 8/22 stable   Debility/deconditioning PT/OT eval-Home health recommended   Chronic venous ulcer-left lateral/posterior leg Present prior to admission Follows with Dr. Lajoyce Corners Continue dressing changes per wound care team.  Home RN and wound care also ordered.      02/27/23 Patient and husband are very confused about medications.  Taking compazine, reglan, and zofran at home.  Unsure of how often she is taking these medications.  They are often skipping long acting insulin at night due to hypoglycemia at night but then having sugars that are "too high to read" at other times.  Unsure of their next endocrinology appointment.  Denies chest pain or palpitations.  EKG and labs readings reviewed from hospital show QTc 544!Marland Kitchen   Past Medical History:  Diagnosis Date   Anemia    hx   Anxiety    Arthritis    "generalized" (03/15/2014)   CAD (coronary artery disease)    MI in 2000 - MI  2007 - treated bare metal stent (no nuclear since then as 9/11)   Carotid artery disease (HCC)    Chronic diastolic heart failure (HCC)    a) ECHO (08/2013) EF 55-60% and RV function nl b) RHC (08/2013) RA 4, RV 30/5/7, PA 25/10 (16), PCWP 7, Fick CO/CI 6.3/2.7, PVR 1.5 WU, PA 61 and 66%   Daily headache    "~ every other day;  since I fell in June" (03/15/2014)   Depression    Diabetic retinopathy (HCC)    Dyslipidemia    ESRD (end stage renal disease) (HCC)    Dialysis on Tues Thurs Sat   Exertional shortness of breath    History of kidney stones    HTN (hypertension)    Hypothyroidism    Obesity    Osteoarthritis    PAF (paroxysmal atrial fibrillation) (HCC)    Peripheral neuropathy    bilateral feet/hands   PONV (postoperative nausea and vomiting)    RBBB (right bundle branch block)    Old   Stroke (HCC)    mini strokes   Type II diabetes mellitus (HCC)    Type II, [name]. patient has omnipod insulin pump with Novolin R Insulin   Past Surgical History:  Procedure Laterality Date   A/V FISTULAGRAM Left 11/07/2022   Procedure: A/V Fistulagram;  Surgeon: Leonie Douglas, MD;  [facility];  Service: Cardiovascular;  Laterality: Left;   ABDOMINAL HYSTERECTOMY  1980's   AMPUTATION Right 02/24/2018   Procedure: RIGHT FOOT GREAT TOE AND 2ND TOE AMPUTATION;  Surgeon: Nadara Mustard, MD;  Location: [facility] OR;  Service: Orthopedics;  Laterality: Right;   AMPUTATION Right 04/30/2018   Procedure: RIGHT TRANSMETATARSAL AMPUTATION;  Surgeon: Nadara Mustard, MD;  Location: [facility] OR;  Service: Orthopedics;  Laterality: Right;   AMPUTATION Right 05/02/2022   Procedure: RIGHT BELOW KNEE AMPUTATION;  Surgeon: Nadara Mustard, MD;  Location: [facility] OR;  Service: Orthopedics;  Laterality: Right;   APPLICATION OF WOUND VAC Right 06/13/2022   Procedure: APPLICATION OF WOUND VAC;  Surgeon: Nadara Mustard, MD;  [facility] OR;  Service: Orthopedics;  Laterality: Right;   APPLICATION OF WOUND VAC Left 11/14/2022   Procedure: APPLICATION OF WOUND VAC;  Surgeon: Nadara Mustard, MD;  Location[facility];  Service:  Orthopedics;  Laterality: Left;   AV FISTULA PLACEMENT Left 04/02/2022   Procedure: LEFT ARM ARTERIOVENOUS (AV) FISTULA CREATION;  Surgeon: Nada Libman, MD;  [facility];  Service:  Vascular;  Laterality: Left;  PERIPHERAL NERVE BLOCK   BASCILIC VEIN TRANSPOSITION Left 07/31/2022   Procedure: LEFT ARM SECOND STAGE BASILIC VEIN TRANSPOSITION;  Surgeon: Nada Libman, MD;  [facility];  Service: Vascular;  Laterality: Left;   BIOPSY  05/27/2020   Procedure: BIOPSY;  Surgeon: Lanelle Bal, DO;  [facility];  Service: Endoscopy;;   CATARACT EXTRACTION, BILATERAL Bilateral ?2013   COLONOSCOPY W/ POLYPECTOMY     COLONOSCOPY WITH PROPOFOL N/A 03/13/2019   Procedure: COLONOSCOPY WITH PROPOFOL;  Surgeon: Beverley Fiedler, MD;  Location: [facility] ENDOSCOPY;  Service: Gastroenterology;  Laterality: N/A;   CORONARY ANGIOPLASTY WITH STENT PLACEMENT  1999; 2007   "1 + 1"   ERCP N/A 02/03/2022   Procedure: ENDOSCOPIC RETROGRADE CHOLANGIOPANCREATOGRAPHY (ERCP);  Surgeon: Jeani Hawking, MD;  Location: [facility] ENDOSCOPY;  Service: Gastroenterology;  Laterality: N/A;   ESOPHAGOGASTRODUODENOSCOPY N/A 02/12/2023   Procedure: ESOPHAGOGASTRODUODENOSCOPY (EGD);  Surgeon: Jeani Hawking, MD;  Location: [facility] ENDOSCOPY;  Service: Gastroenterology;  Laterality: N/A;   ESOPHAGOGASTRODUODENOSCOPY (EGD) WITH PROPOFOL N/A 03/13/2019   Procedure: ESOPHAGOGASTRODUODENOSCOPY (EGD) WITH PROPOFOL;  Surgeon: Beverley Fiedler, MD;  Location: [facility] ENDOSCOPY;  Service: Gastroenterology;  Laterality: N/A;   ESOPHAGOGASTRODUODENOSCOPY (EGD) WITH PROPOFOL N/A 05/27/2020   Procedure: ESOPHAGOGASTRODUODENOSCOPY (EGD) WITH PROPOFOL;  Surgeon: Lanelle Bal, DO;  [facility];  Service: Endoscopy;  Laterality: N/A;   ESOPHAGOGASTRODUODENOSCOPY (EGD) WITH PROPOFOL N/A 09/03/2022   Procedure: ESOPHAGOGASTRODUODENOSCOPY (EGD) WITH PROPOFOL;  Surgeon: Jeani Hawking, MD;  Location: [facility] ENDOSCOPY;  Service: Gastroenterology;  Laterality: N/A;   EYE SURGERY Bilateral    lazer   FLEXIBLE SIGMOIDOSCOPY N/A 05/23/2022   Procedure: FLEXIBLE SIGMOIDOSCOPY;  Surgeon: Jeani Hawking, MD;  Location: [facility] ENDOSCOPY;  Service:  Gastroenterology;  Laterality: N/A;   FLEXIBLE SIGMOIDOSCOPY N/A 05/24/2022   Procedure: FLEXIBLE SIGMOIDOSCOPY;  Surgeon: Imogene Burn, MD;  Location: [facility] ENDOSCOPY;  Service: Gastroenterology;  Laterality: N/A;   FLEXIBLE SIGMOIDOSCOPY N/A 09/03/2022   Procedure: FLEXIBLE SIGMOIDOSCOPY;  Surgeon: Jeani Hawking, MD;  Location: [facility] ENDOSCOPY;  Service: Gastroenterology;  Laterality: N/A;   HEMOSTASIS CLIP PLACEMENT  03/13/2019   Procedure: HEMOSTASIS CLIP PLACEMENT;  Surgeon: Beverley Fiedler, MD;  Location: [facility] ENDOSCOPY;  Service: Gastroenterology;;   HEMOSTASIS CLIP PLACEMENT  05/23/2022   Procedure: HEMOSTASIS CLIP PLACEMENT;  Surgeon: Jeani Hawking, MD;  Location: [facility] ENDOSCOPY;  Service: Gastroenterology;;   HEMOSTASIS CONTROL  05/24/2022   Procedure: HEMOSTASIS CONTROL;  Surgeon: Imogene Burn, MD;  Location: [facility] ENDOSCOPY;  Service: Gastroenterology;;   HOT HEMOSTASIS N/A 05/23/2022   Procedure: HOT HEMOSTASIS (ARGON PLASMA COAGULATION/BICAP);  Surgeon: Jeani Hawking, MD;  Location: [facility] ENDOSCOPY;  Service: Gastroenterology;  Laterality: N/A;   I & D EXTREMITY Left 05/05/2022   Procedure: IRRIGATION AND DEBRIDEMENT LEFT ARM AV FISTULA;  Surgeon: Cephus Shelling, MD;  Location: [facility] OR;  Service: Vascular;  Laterality: Left;   I & D EXTREMITY N/A 11/14/2022   Procedure: IRRIGATION AND DEBRIDEMENT OF LOWER EXTREMITY WOUND;  Surgeon: Nadara Mustard, MD;  [facility];  Service: Orthopedics;  Laterality: N/A;   INSERTION OF DIALYSIS CATHETER Right 04/02/2022   Procedure: INSERTION OF TUNNELED DIALYSIS CATHETER;  Surgeon: Nada Libman, MD;  [facility];  Service: Vascular;  Laterality: Right;   KNEE ARTHROSCOPY Left 10/25/2006  POLYPECTOMY  03/13/2019   Procedure: POLYPECTOMY;  Surgeon: Beverley Fiedler, MD;  Location: [facility] ENDOSCOPY;  Service: Gastroenterology;;   REMOVAL OF STONES  02/03/2022   Procedure: REMOVAL OF STONES;  Surgeon: Jeani Hawking, MD;  Location: [facility] ENDOSCOPY;  Service:  Gastroenterology;;   REVISON OF ARTERIOVENOUS FISTULA Left 08/20/2022   Procedure: REVISON OF LEFT ARM ARTERIOVENOUS FISTULA;  Surgeon: Nada Libman, MD;  [facility];  Service: Vascular;  Laterality: Left;   RIGHT HEART CATH N/A 07/24/2017   Procedure: RIGHT HEART CATH;  Surgeon: Dolores Patty, MD;  [facility];  Service: Cardiovascular;  Laterality: N/A;   RIGHT HEART CATHETERIZATION N/A 09/22/2013   Procedure: RIGHT HEART CATH;  Surgeon: Dolores Patty, MD;  Location: [facility] CATH LAB;  Service: Cardiovascular;  Laterality: N/A;   SHOULDER ARTHROSCOPY WITH OPEN ROTATOR CUFF REPAIR Right 03/14/2014   Procedure: RIGHT SHOULDER ARTHROSCOPY WITH BICEPS RELEASE, OPEN SUBSCAPULA REPAIR, OPEN SUPRASPINATUS REPAIR.;  Surgeon: Cammy Copa, MD;  Location: [facility] OR;  Service: Orthopedics;  Laterality: Right;   SPHINCTEROTOMY  02/03/2022   Procedure: SPHINCTEROTOMY;  Surgeon: Jeani Hawking, MD;  Location: [facility] ENDOSCOPY;  Service: Gastroenterology;;   STUMP REVISION Right 06/13/2022   Procedure: REVISION RIGHT BELOW KNEE AMPUTATION;  Surgeon: Nadara Mustard, MD;  Location: [facility] OR;  Service: Orthopedics;  Laterality: Right;   TEE WITHOUT CARDIOVERSION N/A 02/04/2022   Procedure: TRANSESOPHAGEAL ECHOCARDIOGRAM (TEE);  Surgeon: Dolores Patty, MD;  Location: [facility] ENDOSCOPY;  Service: Cardiovascular;  Laterality: N/A;   THROMBECTOMY W/ EMBOLECTOMY Left 08/20/2022   Procedure: THROMBECTOMY OF LEFT ARM ARTERIOVENOUS FISTULA;  Surgeon: Nada Libman, MD;  [facility] OR;  Service: Vascular;  Laterality: Left;   TOE AMPUTATION Right 02/24/2018   GREAT TOE AND 2ND TOE AMPUTATION   TUBAL LIGATION  1970's   Current Outpatient Medications on File Prior to Visit  Medication Sig Dispense Refill   acetaminophen (TYLENOL) 325 MG tablet Take 2 tablets (650 mg total) by mouth every 6 (six) hours as needed for mild pain (or Fever >/= 101).     amiodarone (PACERONE) 200 MG tablet Take 1  tablet (200 mg total) by mouth daily. 30 tablet 0   apixaban (ELIQUIS) 5 MG TABS tablet Take 1 tablet (5 mg total) by mouth 2 (two) times daily. 60 tablet 0   ascorbic acid (VITAMIN C) 500 MG tablet Take 1 tablet (500 mg total) by mouth daily. 100 tablet 0   atorvastatin (LIPITOR) 80 MG tablet TAKE 1 TABLET BY MOUTH EVERY DAY 90 tablet 0   carvedilol (COREG) 6.25 MG tablet Take 6.25 mg by mouth 2 (two) times daily with a meal.     clonazePAM (KLONOPIN) 0.5 MG disintegrating tablet Take 1 tablet (0.5 mg total) by mouth 2 (two) times daily. 60 tablet 1   ferric citrate (AURYXIA) 1 GM 210 MG(Fe) tablet Take 2 tablets (420 mg total) by mouth 3 (three) times daily with meals. 180 tablet 0   folic acid (FOLVITE) 1 MG tablet Take 1 tablet (1 mg total) by mouth daily. 30 tablet 1   insulin aspart (NOVOLOG) 100 UNIT/ML injection Inject 0-15 Units into the skin 3 (three) times daily with meals. CBG < 70: treat low blood sugar CBG 70 - 120: 0 units CBG 121 - 150: 2 units CBG 151 - 200: 3 units CBG 201 - 250: 5 units CBG 251 - 300: 8 units CBG 301 - 350: 11 units CBG 351 - 400: 15 units  CBG > 400: call MD     insulin glargine (LANTUS) 100 UNIT/ML Solostar Pen Inject 26 Units into the skin daily.     levothyroxine (SYNTHROID) 75 MCG tablet Take 1 tablet (75 mcg total) by mouth daily before breakfast. 30 tablet 0   LORazepam (ATIVAN) 0.5 MG tablet Take 0.5 mg by mouth at bedtime.     melatonin 3 MG TABS tablet Take 1 tablet (3 mg total) by mouth at bedtime. 30 tablet 0   methocarbamol (ROBAXIN) 750 MG tablet Take 1 tablet (750 mg total) by mouth every 6 (six) hours as needed for muscle spasms. 60 tablet 0   Methoxy PEG-Epoetin Beta (MIRCERA IJ) Mircera     metoCLOPramide (REGLAN) 5 MG tablet Take 5 mg by mouth 3 (three) times daily.     midodrine (PROAMATINE) 10 MG tablet Take 1 tablet (10 mg total) by mouth Every Tuesday,Thursday,and Saturday with dialysis. 30 tablet 0   mirtazapine (REMERON) 7.5 MG  tablet Take 1 tablet (7.5 mg total) by mouth at bedtime. 30 tablet 0   pantoprazole (PROTONIX) 40 MG tablet Take 1 tablet (40 mg total) by mouth 2 (two) times daily before a meal. 180 tablet 1   polyethylene glycol (MIRALAX / GLYCOLAX) 17 g packet Take 17 g by mouth daily. (Patient taking differently: Take 17 g by mouth See admin instructions. Every other day)     venlafaxine XR (EFFEXOR-XR) 75 MG 24 hr capsule Take 1 capsule (75 mg total) by mouth daily with breakfast. Stop trintellix 30 capsule 3   Zinc Sulfate 220 (50 Zn) MG TABS Take 1 tablet (220 mg total) by mouth daily. 100 tablet 0   prochlorperazine (COMPAZINE) 5 MG tablet Take 1 tablet (5 mg total) by mouth every 8 (eight) hours as needed for up to 5 days for nausea or vomiting. 15 tablet 0   No current facility-administered medications on file prior to visit.     Allergies  Allergen Reactions   Cephalexin Diarrhea and Other (See Comments)   Codeine Nausea And Vomiting and Other (See Comments)   Social History   Socioeconomic History   Marital status: Married    Spouse name: Ramon Dredge   Number of children: 3   Years of education: 12th   Highest education level: Not on file  Occupational History    Employer: UNEMPLOYED  Tobacco Use   Smoking status: Former    Current packs/day: 0.00    Average packs/day: 3.0 packs/day for 32.0 years (96.0 ttl pk-yrs)    Types: Cigarettes    Start date: 10/24/1965    Quit date: 10/24/1997    Years since quitting: 25.3   Smokeless tobacco: Never  Vaping Use   Vaping status: Never Used  Substance and Sexual Activity   Alcohol use: Not Currently    Comment: "might have 2-3 daiquiris in the summer"   Drug use: No   Sexual activity: Not Currently    Birth control/protection: Surgical    Comment: Hysterectomy  Other Topics Concern   Not on file  Social History Narrative   Pt lives at home with her spouse.Caffeine Use- 3 sodas daily.  Update:  now resides [facility]   Social  Determinants of Health   Financial Resource Strain: Low Risk  (08/16/2021)   Overall Financial Resource Strain (CARDIA)    Difficulty of Paying Living Expenses: Not hard at all  Food Insecurity: No Food Insecurity (02/11/2023)   Hunger Vital Sign    Worried About Running Out of  Food in the Last Year: Never true    Ran Out of Food in the Last Year: Never true  Transportation Needs: No Transportation Needs (02/11/2023)   PRAPARE - Administrator, Civil Service (Medical): No    Lack of Transportation (Non-Medical): No  Physical Activity: Inactive (08/16/2021)   Exercise Vital Sign    Days of Exercise per Week: 0 days    Minutes of Exercise per Session: 0 min  Stress: No Stress Concern Present (08/16/2021)   [employer] of Occupational Health - Occupational Stress Questionnaire    Feeling of Stress : Not at all  Social Connections: Moderately Isolated (08/16/2021)   Social Connection and Isolation Panel [NHANES]    Frequency of Communication with Friends and Family: More than three times a week    Frequency of Social Gatherings with Friends and Family: More than three times a week    Attends Religious Services: Never    Database administrator or Organizations: No    Attends Banker Meetings: Never    Marital Status: Married  Catering manager Violence: Not At Risk (02/11/2023)   Humiliation, Afraid, Rape, and Kick questionnaire    Fear of Current or Ex-Partner: No    Emotionally Abused: No    Physically Abused: No    Sexually Abused: No     Review of Systems  Gastrointestinal:  Positive for diarrhea.       Objective:   Physical Exam Constitutional:      General: She is not in acute distress.    Appearance: Normal appearance. She is obese. She is not ill-appearing or toxic-appearing.  Cardiovascular:     Rate and Rhythm: Normal rate and regular rhythm.     Heart sounds: Normal heart sounds. No murmur heard.    No friction rub. No gallop.  Pulmonary:      Effort: Pulmonary effort is normal. No respiratory distress.     Breath sounds: Normal breath sounds. No stridor. No wheezing, rhonchi or rales.  Abdominal:     General: Bowel sounds are normal.     Palpations: Abdomen is soft.     Tenderness: There is no abdominal tenderness.  Musculoskeletal:     Left lower leg: No edema.  Skin:    General: Skin is warm.  Neurological:     Mental Status: She is alert.    Patient has a right lower limb amputation    Assessment & Plan:  Uncontrolled type 2 diabetes mellitus with hyperglycemia (HCC) - Plan: Hemoglobin A1c, Magnesium  Prolonged QT interval  Gastroparesis Consult cardiology about whether amiodarone should be changed due to long QTc interval.  Will dc remeron.  Use compazine every 8 hours only.  DC zofran and reglan.  Start using long acting insulin in the am to avoid hypoglycemia and try to get consistency in her sugars.

## 2023-02-27 NOTE — Addendum Note (Signed)
Addended by: Lynnea Ferrier T on: 02/27/2023 12:48 PM   Modules accepted: Orders

## 2023-03-02 ENCOUNTER — Telehealth: Payer: Self-pay

## 2023-03-02 ENCOUNTER — Other Ambulatory Visit: Payer: Self-pay | Admitting: Family Medicine

## 2023-03-02 ENCOUNTER — Other Ambulatory Visit (HOSPITAL_COMMUNITY): Payer: Self-pay

## 2023-03-02 NOTE — Telephone Encounter (Signed)
Prescription Request  03/02/2023  LOV: 02/27/2023  What is the name of the medication or equipment? insulin aspart (NOVOLOG) 100 UNIT/ML injection   Have you contacted your pharmacy to request a refill? Yes   Which pharmacy would you like this sent to?  [id number] [place], [place] - [address] [place] [address] Phone: [phone] Fax: [phone]    Patient notified that their request is being sent to the clinical staff for review and that they should receive a response within 2 business days.   Please advise at [facility] [phone]

## 2023-03-02 NOTE — Telephone Encounter (Signed)
Heather Nurse Case Manager with [employer] called to get VO for [facility] for this pt.   Please contact Heather at [phone]

## 2023-03-03 ENCOUNTER — Telehealth: Payer: Self-pay | Admitting: Family Medicine

## 2023-03-03 ENCOUNTER — Other Ambulatory Visit: Payer: Self-pay | Admitting: Family Medicine

## 2023-03-03 DIAGNOSIS — N186 End stage renal disease: Secondary | ICD-10-CM | POA: Diagnosis not present

## 2023-03-03 DIAGNOSIS — N2581 Secondary hyperparathyroidism of renal origin: Secondary | ICD-10-CM | POA: Diagnosis not present

## 2023-03-03 DIAGNOSIS — D689 Coagulation defect, unspecified: Secondary | ICD-10-CM | POA: Diagnosis not present

## 2023-03-03 DIAGNOSIS — E876 Hypokalemia: Secondary | ICD-10-CM | POA: Diagnosis not present

## 2023-03-03 DIAGNOSIS — Z992 Dependence on renal dialysis: Secondary | ICD-10-CM | POA: Diagnosis not present

## 2023-03-03 DIAGNOSIS — D509 Iron deficiency anemia, unspecified: Secondary | ICD-10-CM | POA: Diagnosis not present

## 2023-03-03 DIAGNOSIS — D631 Anemia in chronic kidney disease: Secondary | ICD-10-CM | POA: Diagnosis not present

## 2023-03-03 MED ORDER — PROCHLORPERAZINE MALEATE 5 MG PO TABS: 5.0000 mg | ORAL_TABLET | Freq: Three times a day (TID) | ORAL | 0 refills | Status: DC | PRN

## 2023-03-03 NOTE — Telephone Encounter (Signed)
Requested medication (s) are due for refill today:   Requested medication (s) are on the active medication list: Yes  Last refill:  02/02/23  Future visit scheduled: No  Notes to clinic:  Last filled by Dr. Lowell Guitar.    Requested Prescriptions  Pending Prescriptions Disp Refills   insulin aspart (NOVOLOG) 100 UNIT/ML injection      Sig: Inject 0-15 Units into the skin 3 (three) times daily with meals. CBG < 70: treat low blood sugar CBG 70 - 120: 0 units CBG 121 - 150: 2 units CBG 151 - 200: 3 units CBG 201 - 250: 5 units CBG 251 - 300: 8 units CBG 301 - 350: 11 units CBG 351 - 400: 15 units CBG > 400: call MD     Endocrinology:  Diabetes - Insulins Failed - 03/02/2023  4:01 PM      Failed - HBA1C is between 0 and 7.9 and within 180 days    Hgb A1c MFr Bld  Date Value Ref Range Status  08/27/2022 7.9 (H) 4.8 - 5.6 % Final    Comment:    (NOTE)         Prediabetes: 5.7 - 6.4         Diabetes: >6.4         Glycemic control for adults with diabetes: <7.0          Failed - Valid encounter within last 6 months    Recent Outpatient Visits           1 year ago Cellulitis of lower extremity, unspecified laterality   [facility] Donita Brooks, MD   1 year ago Diarrhea, unspecified type   [facility] [name], Priscille Heidelberg, MD   1 year ago Dysuria   [facility] Donita Brooks, MD   1 year ago Stage 4 chronic kidney disease (HCC)   [facility] Donita Brooks, MD   1 year ago Chronic diastolic CHF (congestive heart failure) (HCC)   [facility] [name], Priscille Heidelberg, MD

## 2023-03-03 NOTE — Telephone Encounter (Signed)
Susan [name] came to the office to follow up on med refill. He's requesting for [name] to permanently [facility] [address] and to forward current script to them for  prochlorperazine (COMPAZINE) 5 MG tablet  Pharmacy:   [id number] [place],  - [address] AT [address] [place] [address] Phone: [phone]  Fax: [phone] DEA #: [id number]   Please advise Mr. Wiedman at [phone].

## 2023-03-04 ENCOUNTER — Other Ambulatory Visit: Payer: Self-pay | Admitting: Family Medicine

## 2023-03-04 LAB — HEMOGLOBIN A1C
Hgb A1c MFr Bld: 9 %{Hb} — ABNORMAL HIGH (ref <5.7)
Mean Plasma Glucose: 212 mg/dL
eAG (mmol/L): 11.7 mmol/L

## 2023-03-04 LAB — MAGNESIUM: Magnesium: 1.8 mg/dL (ref 1.5–2.5)

## 2023-03-04 MED ORDER — INSULIN ASPART 100 UNIT/ML IJ SOLN: 0.0000 [IU] | Freq: Three times a day (TID) | INTRAMUSCULAR | 5 refills | Status: DC

## 2023-03-06 ENCOUNTER — Other Ambulatory Visit: Payer: Self-pay

## 2023-03-06 DIAGNOSIS — I10 Essential (primary) hypertension: Secondary | ICD-10-CM

## 2023-03-09 ENCOUNTER — Ambulatory Visit: Payer: PPO | Admitting: Orthopedic Surgery

## 2023-03-09 DIAGNOSIS — I87332 Chronic venous hypertension (idiopathic) with ulcer and inflammation of left lower extremity: Secondary | ICD-10-CM

## 2023-03-09 DIAGNOSIS — Z89511 Acquired absence of right leg below knee: Secondary | ICD-10-CM

## 2023-03-09 DIAGNOSIS — S88111D Complete traumatic amputation at level between knee and ankle, right lower leg, subsequent encounter: Secondary | ICD-10-CM

## 2023-03-09 DIAGNOSIS — I890 Lymphedema, not elsewhere classified: Secondary | ICD-10-CM | POA: Diagnosis not present

## 2023-03-10 ENCOUNTER — Encounter: Payer: Self-pay | Admitting: Orthopedic Surgery

## 2023-03-10 DIAGNOSIS — D509 Iron deficiency anemia, unspecified: Secondary | ICD-10-CM | POA: Diagnosis not present

## 2023-03-10 DIAGNOSIS — Z992 Dependence on renal dialysis: Secondary | ICD-10-CM | POA: Diagnosis not present

## 2023-03-10 DIAGNOSIS — D689 Coagulation defect, unspecified: Secondary | ICD-10-CM | POA: Diagnosis not present

## 2023-03-10 DIAGNOSIS — N186 End stage renal disease: Secondary | ICD-10-CM | POA: Diagnosis not present

## 2023-03-10 DIAGNOSIS — N2581 Secondary hyperparathyroidism of renal origin: Secondary | ICD-10-CM | POA: Diagnosis not present

## 2023-03-10 DIAGNOSIS — E1122 Type 2 diabetes mellitus with diabetic chronic kidney disease: Secondary | ICD-10-CM | POA: Diagnosis not present

## 2023-03-10 NOTE — Progress Notes (Signed)
Office Visit Note   Patient: Susan [name]           Date of Birth: 1951-03-29           MRN: [id number] Visit Date: 03/09/2023              Requested by: Donita Brooks, MD [address] [place],  [place] [address] PCP: Donita Brooks, MD  Chief Complaint  Patient presents with   Left Leg - Follow-up      HPI: Patient is a 72 year old woman who presents in follow-up for left lower extremity venous ulceration treated with Kerecis tissue graft.  Assessment & Plan: Visit Diagnoses:  1. Below-knee amputation of right lower extremity, subsequent encounter (HCC)   2. Lymphedema   3. 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  She will resume wearing her compression stockings daily.  Follow-Up Instructions: Return if symptoms worsen or fail to improve.   Ortho Exam  Patient is alert, oriented, no adenopathy, well-dressed, normal affect, normal respiratory effort. Examination the venous ulcer is completely healed there is no dermatitis or cellulitis.  There is no drainage.  Imaging: No results found.   Labs: Lab Results  Component Value Date   HGBA1C 9.0 (H) 02/27/2023   HGBA1C 7.9 (H) 08/27/2022   HGBA1C 8.9 (H) 05/02/2022   ESRSEDRATE 17 11/10/2022   CRP 3.6 (H) 11/10/2022   CRP 0.6 09/01/2022   CRP 0.7 08/22/2020   LABURIC 4.7 08/09/2020   LABURIC 9.1 (H) 10/12/2018   LABURIC 9.0 (H) 03/06/2018   REPTSTATUS 11/19/2022 FINAL 11/14/2022   GRAMSTAIN  11/14/2022    MODERATE WBC PRESENT,BOTH PMN AND MONONUCLEAR RARE SQUAMOUS EPITHELIAL CELLS PRESENT NO ORGANISMS SEEN Performed at [facility], [address], [place], [place] [address]    CULT  11/14/2022    FEW PROTEUS MIRABILIS RARE KLEBSIELLA PNEUMONIAE NO ANAEROBES ISOLATED FEW ENTEROCOCCUS FAECALIS    LABORGA PROTEUS MIRABILIS 11/14/2022   LABORGA KLEBSIELLA PNEUMONIAE 11/14/2022   LABORGA ENTEROCOCCUS  FAECALIS 11/14/2022     Lab Results  Component Value Date   ALBUMIN 2.6 (L) 02/17/2023   ALBUMIN 2.8 (L) 02/15/2023   ALBUMIN 2.9 (L) 02/14/2023   PREALBUMIN 12 (L) 11/10/2022   PREALBUMIN 13 (L) 05/02/2022    Lab Results  Component Value Date   MG 1.8 02/27/2023   MG 1.6 (L) 02/15/2023   MG 1.9 02/14/2023   Lab Results  Component Value Date   VD25OH 42.14 05/02/2022    Lab Results  Component Value Date   PREALBUMIN 12 (L) 11/10/2022   PREALBUMIN 13 (L) 05/02/2022      Latest Ref Rng & Units 02/17/2023    9:17 AM 02/15/2023   10:43 AM 02/14/2023    7:02 AM  CBC EXTENDED  WBC 4.0 - 10.5 K/uL 5.2  6.7  7.1   RBC 3.87 - 5.11 MIL/uL 4.12  4.58  4.18   Hemoglobin 12.0 - 15.0 g/dL 25.3  66.4  9.7   HCT 40.3 - 46.0 % 33.4  35.7  33.0   Platelets 150 - 400 K/uL 186  196  191      There is no height or weight on file to calculate BMI.  Orders:  Orders Placed This Encounter  Procedures   Ambulatory referral to Physical Therapy   No orders of the defined types were placed in this encounter.  Procedures: No procedures performed  Clinical Data: No additional findings.  ROS:  All other systems negative, except as noted in the HPI. Review of Systems  Objective: Vital Signs: There were no vitals taken for this visit.  Specialty Comments:  No specialty comments available.  PMFS History: Patient Active Problem List   Diagnosis Date Noted   Hyperosmolar hyperglycemic state (HHS) (HCC) 02/05/2023   Depression 01/29/2023   Nausea 01/24/2023   Intractable nausea and vomiting 01/20/2023   Proctitis 01/15/2023   Acute on chronic anemia 11/25/2022   Right below-knee amputee (HCC) 11/20/2022   Cellulitis of left lower extremity 11/14/2022   Maggot infestation 11/12/2022   Complicated wound infection 11/10/2022   Secondary hypercoagulable state (HCC) 09/04/2022   Right sided abdominal pain 08/31/2022   Constipation 06/07/2022   History of Clostridioides  difficile colitis 06/06/2022   Below-knee amputation of right lower extremity (HCC) 06/06/2022   Diverticulitis 06/05/2022   Stercoral colitis 06/05/2022   C. 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) 04/07/2022   Allergy, unspecified, initial encounter 04/07/2022   Dependence on renal dialysis (HCC) 04/07/2022   Gout due to renal impairment, unspecified site 04/07/2022   Hypertensive heart and chronic kidney disease with heart failure and with stage 5 chronic kidney disease, or end stage renal disease (HCC) 04/07/2022   Personal history of transient ischemic attack (TIA), and cerebral infarction without residual deficits 04/07/2022   Renal osteodystrophy 04/07/2022   Venous stasis ulcer of right calf (HCC) 03/31/2022   Fistula, colovaginal 03/26/2022   Diarrhea 03/26/2022   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   Sepsis without acute organ dysfunction (HCC)    Bacteremia    Acute pancreatitis 02/01/2022   Abdominal pain 02/01/2022   SIRS (systemic inflammatory response syndrome) (HCC) 02/01/2022   Transaminitis 02/01/2022   History of anemia due to chronic kidney disease 02/01/2022   Paroxysmal atrial fibrillation (HCC) 02/01/2022   Uncontrolled type 2 diabetes mellitus with hyperglycemia, with long-term current use of insulin (HCC) 01/14/2022   NSTEMI (non-ST elevated myocardial infarction) (HCC) 03/05/2021   Acute renal  failure superimposed on stage 4 chronic kidney disease (HCC) 08/22/2020   Hypoalbuminemia 05/25/2020   GERD (gastroesophageal reflux disease) 05/25/2020   Pressure injury of skin 05/17/2020   Acute on chronic combined systolic and diastolic congestive heart failure (HCC) 03/07/2020   Type 2 diabetes mellitus with diabetic polyneuropathy, with long-term current use of insulin (HCC) 03/07/2020   Obesity, Class III, BMI 40-49.9 (morbid obesity) (HCC) 03/07/2020   Common bile duct (CBD) obstruction 05/28/2019   Benign neoplasm of ascending colon    Benign neoplasm of transverse colon    Benign neoplasm of descending colon    Benign neoplasm of sigmoid colon    Gastric polyps    Hyperkalemia 03/11/2019   Prolonged QT interval 03/11/2019   Acute blood loss anemia 03/11/2019   Onychomycosis 06/21/2018   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)    Venous ulcer of both lower extremities with  varicose veins (HCC)    PVD (peripheral vascular disease) (HCC) 10/26/2017   E-coli UTI 07/27/2017   Hypothyroidism 07/27/2017   AKI (acute kidney injury) (HCC)    PAH (pulmonary artery hypertension) (HCC)    Impaired ambulation 07/19/2017   Nausea & vomiting 07/15/2017   Leg cramps 02/27/2017   Peripheral edema 01/12/2017   Diabetic neuropathy (HCC) 11/12/2016   CKD (chronic kidney disease), stage IV (HCC) 10/24/2015   Anemia of chronic disease 10/03/2015   Historical diagnosis of generalized anxiety disorder 10/03/2015   Secondary insomnia 10/03/2015   Hyperglycemia due to diabetes mellitus (HCC) 06/07/2015   Non compliance with medical treatment 04/17/2014   Rotator cuff tear 03/14/2014   Obesity 09/23/2013   Chronic HFrEF (heart failure with reduced ejection fraction) (HCC) 06/03/2013   Hypotension 12/25/2012   Hyponatremia 12/25/2012   Urinary incontinence    MDD (major depressive disorder), recurrent episode, moderate (HCC) 11/12/2010   RBBB (right bundle branch block)     Wide-complex tachycardia    Coronary artery disease    Hyperlipemia 01/22/2009   Chronic hypotension 01/22/2009   Past Medical History:  Diagnosis Date   Anemia    hx   Anxiety    Arthritis    "generalized" (03/15/2014)   CAD (coronary artery disease)    MI in 2000 - MI  2007 - treated bare metal stent (no nuclear since then as 9/11)   Carotid artery disease (HCC)    Chronic diastolic heart failure (HCC)    a) ECHO (08/2013) EF 55-60% and RV function nl b) RHC (08/2013) RA 4, RV 30/5/7, PA 25/10 (16), PCWP 7, Fick CO/CI 6.3/2.7, PVR 1.5 WU, PA 61 and 66%   Daily headache    "~ every other day; since I fell in June" (03/15/2014)   Depression    Diabetic retinopathy (HCC)    Dyslipidemia    ESRD (end stage renal disease) (HCC)    Dialysis on Tues Thurs Sat   Exertional shortness of breath    History of kidney stones    HTN (hypertension)    Hypothyroidism    Obesity    Osteoarthritis    PAF (paroxysmal atrial fibrillation) (HCC)    Peripheral neuropathy    bilateral feet/hands   PONV (postoperative nausea and vomiting)    RBBB (right bundle branch block)    Old   Stroke (HCC)    mini strokes   Type II diabetes mellitus (HCC)    Type II, [name]. patient has omnipod insulin pump with Novolin R Insulin    Family History  Problem Relation Age of Onset   Heart attack Mother 70    Past Surgical History:  Procedure Laterality Date   A/V FISTULAGRAM Left 11/07/2022   Procedure: A/V Fistulagram;  Surgeon: Leonie Douglas, MD;  Location: [facility];  Service: Cardiovascular;  Laterality: Left;   ABDOMINAL HYSTERECTOMY  1980's   AMPUTATION Right 02/24/2018   Procedure: RIGHT FOOT GREAT TOE AND 2ND TOE AMPUTATION;  Surgeon: Nadara Mustard, MD;  Location: [facility] OR;  Service: Orthopedics;  Laterality: Right;   AMPUTATION Right 04/30/2018   Procedure: RIGHT TRANSMETATARSAL AMPUTATION;  Surgeon: Nadara Mustard, MD;  Location: [facility] OR;  Service: Orthopedics;   Laterality: Right;   AMPUTATION Right 05/02/2022   Procedure: RIGHT BELOW KNEE AMPUTATION;  Surgeon: Nadara Mustard, MD;  Location: [facility] OR;  Service: Orthopedics;  Laterality: Right;   APPLICATION OF WOUND VAC Right 06/13/2022   Procedure: APPLICATION  OF WOUND VAC;  Surgeon: Nadara Mustard, MD;  Location: [facility] OR;  Service: Orthopedics;  Laterality: Right;   APPLICATION OF WOUND VAC Left 11/14/2022   Procedure: APPLICATION OF WOUND VAC;  Surgeon: Nadara Mustard, MD;  Location: [facility] OR;  Service: Orthopedics;  Laterality: Left;   AV FISTULA PLACEMENT Left 04/02/2022   Procedure: LEFT ARM ARTERIOVENOUS (AV) FISTULA CREATION;  Surgeon: Nada Libman, MD;  Location: [facility] OR;  Service: Vascular;  Laterality: Left;  PERIPHERAL NERVE BLOCK   BASCILIC VEIN TRANSPOSITION Left 07/31/2022   Procedure: LEFT ARM SECOND STAGE BASILIC VEIN TRANSPOSITION;  Surgeon: Nada Libman, MD;  Location: [facility] OR;  Service: Vascular;  Laterality: Left;   BIOPSY  05/27/2020   Procedure: BIOPSY;  Surgeon: Lanelle Bal, DO;  [facility]: [facility];  Service: Endoscopy;;   CATARACT EXTRACTION, BILATERAL Bilateral ?2013   COLONOSCOPY W/ POLYPECTOMY     COLONOSCOPY WITH PROPOFOL N/A 03/13/2019   Procedure: COLONOSCOPY WITH PROPOFOL;  Surgeon: Beverley Fiedler, MD;  Location: [facility] ENDOSCOPY;  Service: Gastroenterology;  Laterality: N/A;   CORONARY ANGIOPLASTY WITH STENT PLACEMENT  1999; 2007   "1 + 1"   ERCP N/A 02/03/2022   Procedure: ENDOSCOPIC RETROGRADE CHOLANGIOPANCREATOGRAPHY (ERCP);  Surgeon: Jeani Hawking, MD;  Location: [facility] ENDOSCOPY;  Service: Gastroenterology;  Laterality: N/A;   ESOPHAGOGASTRODUODENOSCOPY N/A 02/12/2023   Procedure: ESOPHAGOGASTRODUODENOSCOPY (EGD);  Surgeon: Jeani Hawking, MD;  Location: [facility] ENDOSCOPY;  Service: Gastroenterology;  Laterality: N/A;   ESOPHAGOGASTRODUODENOSCOPY (EGD) WITH PROPOFOL N/A 03/13/2019   Procedure: ESOPHAGOGASTRODUODENOSCOPY (EGD) WITH PROPOFOL;  Surgeon: Beverley Fiedler, MD;  Location:  [facility] ENDOSCOPY;  Service: Gastroenterology;  Laterality: N/A;   ESOPHAGOGASTRODUODENOSCOPY (EGD) WITH PROPOFOL N/A 05/27/2020   Procedure: ESOPHAGOGASTRODUODENOSCOPY (EGD) WITH PROPOFOL;  Surgeon: Lanelle Bal, DO;  [facility];  Service: Endoscopy;  Laterality: N/A;   ESOPHAGOGASTRODUODENOSCOPY (EGD) WITH PROPOFOL N/A 09/03/2022   Procedure: ESOPHAGOGASTRODUODENOSCOPY (EGD) WITH PROPOFOL;  Surgeon: Jeani Hawking, MD;  Location: [facility] ENDOSCOPY;  Service: Gastroenterology;  Laterality: N/A;   EYE SURGERY Bilateral    lazer   FLEXIBLE SIGMOIDOSCOPY N/A 05/23/2022   Procedure: FLEXIBLE SIGMOIDOSCOPY;  Surgeon: Jeani Hawking, MD;  Location: [facility] ENDOSCOPY;  Service: Gastroenterology;  Laterality: N/A;   FLEXIBLE SIGMOIDOSCOPY N/A 05/24/2022   Procedure: FLEXIBLE SIGMOIDOSCOPY;  Surgeon: Imogene Burn, MD;  Location: [facility] ENDOSCOPY;  Service: Gastroenterology;  Laterality: N/A;   FLEXIBLE SIGMOIDOSCOPY N/A 09/03/2022   Procedure: FLEXIBLE SIGMOIDOSCOPY;  Surgeon: Jeani Hawking, MD;  Location: [facility] ENDOSCOPY;  Service: Gastroenterology;  Laterality: N/A;   HEMOSTASIS CLIP PLACEMENT  03/13/2019   Procedure: HEMOSTASIS CLIP PLACEMENT;  Surgeon: Beverley Fiedler, MD;  Location: [facility] ENDOSCOPY;  Service: Gastroenterology;;   HEMOSTASIS CLIP PLACEMENT  05/23/2022   Procedure: HEMOSTASIS CLIP PLACEMENT;  Surgeon: Jeani Hawking, MD;  Location: [facility] ENDOSCOPY;  Service: Gastroenterology;;   HEMOSTASIS CONTROL  05/24/2022   Procedure: HEMOSTASIS CONTROL;  Surgeon: Imogene Burn, MD;  Location: [facility] ENDOSCOPY;  Service: Gastroenterology;;   HOT HEMOSTASIS N/A 05/23/2022   Procedure: HOT HEMOSTASIS (ARGON PLASMA COAGULATION/BICAP);  Surgeon: Jeani Hawking, MD;  Location: [facility] ENDOSCOPY;  Service: Gastroenterology;  Laterality: N/A;   I & D EXTREMITY Left 05/05/2022   Procedure: IRRIGATION AND DEBRIDEMENT LEFT ARM AV FISTULA;  Surgeon: Cephus Shelling, MD;  Location: [facility] OR;  Service: Vascular;  Laterality: Left;    I & D EXTREMITY N/A 11/14/2022   Procedure: IRRIGATION AND DEBRIDEMENT OF LOWER EXTREMITY WOUND;  Surgeon: Nadara Mustard, MD;  Location: [facility]  OR;  Service: Orthopedics;  Laterality: N/A;   INSERTION OF DIALYSIS CATHETER Right 04/02/2022   Procedure: INSERTION OF TUNNELED DIALYSIS CATHETER;  Surgeon: Nada Libman, MD;  Location: [facility] OR;  Service: Vascular;  Laterality: Right;   KNEE ARTHROSCOPY Left 10/25/2006   POLYPECTOMY  03/13/2019   Procedure: POLYPECTOMY;  Surgeon: Beverley Fiedler, MD;  Location: [facility] ENDOS[facility];  Service: Gastroenterology;;   REMOVAL OF STONES  02/03/2022   Procedure: REMOVAL OF STONES;  Surgeon: Jeani Hawking, MD;  Location: [facility] ENDOSCOPY;  Service: Gastroenterology;;   REVISON OF ARTERIOVENOUS FISTULA Left 08/20/2022   Procedure: REVISON OF LEFT ARM ARTERIOVENOUS FISTULA;  Surgeon: Nada Libman, MD;  Location: [facility] OR;  Service: Vascular;  Laterality: Left;   RIGHT HEART CATH N/A 07/24/2017   Procedure: RIGHT HEART CATH;  Surgeon: Dolores Patty, MD;  Location: [facility];  Service: Cardiovascular;  Laterality: N/A;   RIGHT HEART CATHETERIZATION N/A 09/22/2013   Procedure: RIGHT HEART CATH;  Surgeon: Dolores Patty, MD;  Location: [facility] CATH LAB;  Service: Cardiovascular;  Laterality: N/A;   SHOULDER ARTHROSCOPY WITH OPEN ROTATOR CUFF REPAIR Right 03/14/2014   Procedure: RIGHT SHOULDER ARTHROSCOPY WITH BICEPS RELEASE, OPEN SUBSCAPULA REPAIR, OPEN SUPRASPINATUS REPAIR.;  Surgeon: Cammy Copa, MD;  Location: [facility] OR;  Service: Orthopedics;  Laterality: Right;   SPHINCTEROTOMY  02/03/2022   Procedure: SPHINCTEROTOMY;  Surgeon: Jeani Hawking, MD;  Location: [facility] ENDOSCOPY;  Service: Gastroenterology;;   STUMP REVISION Right 06/13/2022   Procedure: REVISION RIGHT BELOW KNEE AMPUTATION;  Surgeon: Nadara Mustard, MD;  Location: [facility] OR;  Service: Orthopedics;  Laterality: Right;   TEE WITHOUT CARDIOVERSION N/A 02/04/2022   Procedure: TRANSESOPHAGEAL ECHOCARDIOGRAM (TEE);   Surgeon: Dolores Patty, MD;  Location: [facility] ENDOSCOPY;  Service: Cardiovascular;  Laterality: N/A;   THROMBECTOMY W/ EMBOLECTOMY Left 08/20/2022   Procedure: THROMBECTOMY OF LEFT ARM ARTERIOVENOUS FISTULA;  Surgeon: Nada Libman, MD;  Location: [facility] OR;  Service: Vascular;  Laterality: Left;   TOE AMPUTATION Right 02/24/2018   GREAT TOE AND 2ND TOE AMPUTATION   TUBAL LIGATION  1970's   Social History   Occupational History    Employer: UNEMPLOYED  Tobacco Use   Smoking status: Former    Current packs/day: 0.00    Average packs/day: 3.0 packs/day for 32.0 years (96.0 ttl pk-yrs)    Types: Cigarettes    Start date: 10/24/1965    Quit date: 10/24/1997    Years since quitting: 25.3   Smokeless tobacco: Never  Vaping Use   Vaping status: Never Used  Substance and Sexual Activity   Alcohol use: Not Currently    Comment: "might have 2-3 daiquiris in the summer"   Drug use: No   Sexual activity: Not Currently    Birth control/protection: Surgical    Comment: Hysterectomy

## 2023-03-11 DIAGNOSIS — I132 Hypertensive heart and chronic kidney disease with heart failure and with stage 5 chronic kidney disease, or end stage renal disease: Secondary | ICD-10-CM | POA: Diagnosis not present

## 2023-03-11 DIAGNOSIS — Z7901 Long term (current) use of anticoagulants: Secondary | ICD-10-CM | POA: Diagnosis not present

## 2023-03-11 DIAGNOSIS — E1165 Type 2 diabetes mellitus with hyperglycemia: Secondary | ICD-10-CM | POA: Diagnosis not present

## 2023-03-11 DIAGNOSIS — K3184 Gastroparesis: Secondary | ICD-10-CM | POA: Diagnosis not present

## 2023-03-11 DIAGNOSIS — K8020 Calculus of gallbladder without cholecystitis without obstruction: Secondary | ICD-10-CM | POA: Diagnosis not present

## 2023-03-11 DIAGNOSIS — N186 End stage renal disease: Secondary | ICD-10-CM | POA: Diagnosis not present

## 2023-03-11 DIAGNOSIS — I5022 Chronic systolic (congestive) heart failure: Secondary | ICD-10-CM | POA: Diagnosis not present

## 2023-03-11 DIAGNOSIS — E1151 Type 2 diabetes mellitus with diabetic peripheral angiopathy without gangrene: Secondary | ICD-10-CM | POA: Diagnosis not present

## 2023-03-11 DIAGNOSIS — E039 Hypothyroidism, unspecified: Secondary | ICD-10-CM | POA: Diagnosis not present

## 2023-03-11 DIAGNOSIS — D631 Anemia in chronic kidney disease: Secondary | ICD-10-CM | POA: Diagnosis not present

## 2023-03-11 DIAGNOSIS — K5730 Diverticulosis of large intestine without perforation or abscess without bleeding: Secondary | ICD-10-CM | POA: Diagnosis not present

## 2023-03-11 DIAGNOSIS — I2510 Atherosclerotic heart disease of native coronary artery without angina pectoris: Secondary | ICD-10-CM | POA: Diagnosis not present

## 2023-03-11 DIAGNOSIS — Z794 Long term (current) use of insulin: Secondary | ICD-10-CM | POA: Diagnosis not present

## 2023-03-11 DIAGNOSIS — Z992 Dependence on renal dialysis: Secondary | ICD-10-CM | POA: Diagnosis not present

## 2023-03-11 DIAGNOSIS — F32A Depression, unspecified: Secondary | ICD-10-CM | POA: Diagnosis not present

## 2023-03-11 DIAGNOSIS — E1122 Type 2 diabetes mellitus with diabetic chronic kidney disease: Secondary | ICD-10-CM | POA: Diagnosis not present

## 2023-03-11 DIAGNOSIS — Z9181 History of falling: Secondary | ICD-10-CM | POA: Diagnosis not present

## 2023-03-11 DIAGNOSIS — I70201 Unspecified atherosclerosis of native arteries of extremities, right leg: Secondary | ICD-10-CM | POA: Diagnosis not present

## 2023-03-11 DIAGNOSIS — F419 Anxiety disorder, unspecified: Secondary | ICD-10-CM | POA: Diagnosis not present

## 2023-03-11 DIAGNOSIS — Z89511 Acquired absence of right leg below knee: Secondary | ICD-10-CM | POA: Diagnosis not present

## 2023-03-11 DIAGNOSIS — E1143 Type 2 diabetes mellitus with diabetic autonomic (poly)neuropathy: Secondary | ICD-10-CM | POA: Diagnosis not present

## 2023-03-11 DIAGNOSIS — I480 Paroxysmal atrial fibrillation: Secondary | ICD-10-CM | POA: Diagnosis not present

## 2023-03-13 ENCOUNTER — Other Ambulatory Visit: Payer: Self-pay | Admitting: Family Medicine

## 2023-03-13 ENCOUNTER — Telehealth: Payer: Self-pay

## 2023-03-13 MED ORDER — METHOCARBAMOL 750 MG PO TABS: 750.0000 mg | ORAL_TABLET | Freq: Four times a day (QID) | ORAL | 0 refills | Status: DC | PRN

## 2023-03-13 NOTE — Telephone Encounter (Signed)
Pt called in to ask if pcp would refill this med methocarbamol (ROBAXIN) 750 MG tablet [[id number]]. Pt stated that she was prescribed this med while in the hospital, and was informed that her pcp may have prescribe refills of this med.Pt stated that she is having shadow pains. Please advise  LOV: 02/27/23 HFU  PHARMACY:  [id number] [place], [place] - [address] [place] [address] Phone: [phone]  Fax: [phone] DEA #: [id number]    Cb#: [phone]

## 2023-03-17 DIAGNOSIS — N186 End stage renal disease: Secondary | ICD-10-CM | POA: Diagnosis not present

## 2023-03-17 DIAGNOSIS — E8779 Other fluid overload: Secondary | ICD-10-CM | POA: Diagnosis not present

## 2023-03-17 DIAGNOSIS — R609 Edema, unspecified: Secondary | ICD-10-CM | POA: Diagnosis not present

## 2023-03-17 DIAGNOSIS — Z992 Dependence on renal dialysis: Secondary | ICD-10-CM | POA: Diagnosis not present

## 2023-03-17 DIAGNOSIS — N2581 Secondary hyperparathyroidism of renal origin: Secondary | ICD-10-CM | POA: Diagnosis not present

## 2023-03-17 DIAGNOSIS — D689 Coagulation defect, unspecified: Secondary | ICD-10-CM | POA: Diagnosis not present

## 2023-03-18 DIAGNOSIS — I2721 Secondary pulmonary arterial hypertension: Secondary | ICD-10-CM | POA: Diagnosis not present

## 2023-03-18 DIAGNOSIS — I5022 Chronic systolic (congestive) heart failure: Secondary | ICD-10-CM | POA: Diagnosis not present

## 2023-03-19 ENCOUNTER — Encounter: Payer: PPO | Admitting: Physical Therapy

## 2023-03-23 ENCOUNTER — Ambulatory Visit: Payer: PPO | Admitting: Physical Therapy

## 2023-03-23 ENCOUNTER — Encounter: Payer: Self-pay | Admitting: Physical Therapy

## 2023-03-23 ENCOUNTER — Other Ambulatory Visit: Payer: Self-pay

## 2023-03-23 DIAGNOSIS — G546 Phantom limb syndrome with pain: Secondary | ICD-10-CM

## 2023-03-23 DIAGNOSIS — R2681 Unsteadiness on feet: Secondary | ICD-10-CM | POA: Diagnosis not present

## 2023-03-23 DIAGNOSIS — Z7409 Other reduced mobility: Secondary | ICD-10-CM

## 2023-03-23 DIAGNOSIS — R2689 Other abnormalities of gait and mobility: Secondary | ICD-10-CM

## 2023-03-23 DIAGNOSIS — M6281 Muscle weakness (generalized): Secondary | ICD-10-CM

## 2023-03-23 NOTE — Addendum Note (Signed)
Addended by: Fraser Din on: 03/23/2023 04:54 PM   Modules accepted: Orders

## 2023-03-23 NOTE — Therapy (Signed)
OUTPATIENT PHYSICAL THERAPY PROSTHETIC EVALUATION   Patient Name: Susan [name] MRN: [id number] DOB:[birth year], 72 y.o., female Today's Date: 03/23/2023  PCP: Donita Brooks, MD  REFERRING PROVIDER: Nadara Mustard, MD  END OF SESSION:  PT End of Session - 03/23/23 1252     Visit Number 1    Number of Visits 25    Date for PT Re-Evaluation 06/18/23    Authorization Type [employer]    Progress Note Due on Visit 10    PT Start Time 1100    PT Stop Time 1145    PT Time Calculation (min) 45 min    Equipment Utilized During Treatment Gait belt    Activity Tolerance Patient tolerated treatment well;Patient limited by fatigue    Behavior During Therapy WFL for tasks assessed/performed             Past Medical History:  Diagnosis Date   Anemia    hx   Anxiety    Arthritis    "generalized" (03/15/2014)   CAD (coronary artery disease)    MI in 2000 - MI  2007 - treated bare metal stent (no nuclear since then as 9/11)   Carotid artery disease (HCC)    Chronic diastolic heart failure (HCC)    a) ECHO (08/2013) EF 55-60% and RV function nl b) RHC (08/2013) RA 4, RV 30/5/7, PA 25/10 (16), PCWP 7, Fick CO/CI 6.3/2.7, PVR 1.5 WU, PA 61 and 66%   Daily headache    "~ every other day; since I fell in June" (03/15/2014)   Depression    Diabetic retinopathy (HCC)    Dyslipidemia    ESRD (end stage renal disease) (HCC)    Dialysis on Tues Thurs Sat   Exertional shortness of breath    History of kidney stones    HTN (hypertension)    Hypothyroidism    Obesity    Osteoarthritis    PAF (paroxysmal atrial fibrillation) (HCC)    Peripheral neuropathy    bilateral feet/hands   PONV (postoperative nausea and vomiting)    RBBB (right bundle branch block)    Old   Stroke (HCC)    mini strokes   Type II diabetes mellitus (HCC)    Type II, [name]. patient has omnipod insulin pump with Novolin R Insulin   Past Surgical History:  Procedure  Laterality Date   A/V FISTULAGRAM Left 11/07/2022   Procedure: A/V Fistulagram;  Surgeon: Leonie Douglas, MD;  [facility]: [facility];  Service: Cardiovascular;  Laterality: Left;   ABDOMINAL HYSTERECTOMY  1980's   AMPUTATION Right 02/24/2018   Procedure: RIGHT FOOT GREAT TOE AND 2ND TOE AMPUTATION;  Surgeon: Nadara Mustard, MD;  Location: [facility] OR;  Service: Orthopedics;  Laterality: Right;   AMPUTATION Right 04/30/2018   Procedure: RIGHT TRANSMETATARSAL AMPUTATION;  Surgeon: Nadara Mustard, MD;  Location: [facility] OR;  Service: Orthopedics;  Laterality: Right;   AMPUTATION Right 05/02/2022   Procedure: RIGHT BELOW KNEE AMPUTATION;  Surgeon: Nadara Mustard, MD;  Location: [facility] OR;  Service: Orthopedics;  Laterality: Right;   APPLICATION OF WOUND VAC Right 06/13/2022   Procedure: APPLICATION OF WOUND VAC;  Surgeon: Nadara Mustard, MD;  Location: [facility] OR;  Service: Orthopedics;  Laterality: Right;   APPLICATION OF WOUND VAC Left 11/14/2022   Procedure: APPLICATION OF WOUND VAC;  Surgeon: Nadara Mustard, MD;  Location: [facility] OR;  Service: Orthopedics;  Laterality: Left;   AV FISTULA PLACEMENT Left  04/02/2022   Procedure: LEFT ARM ARTERIOVENOUS (AV) FISTULA CREATION;  Surgeon: Nada Libman, MD;  Location: [facility] OR;  Service: Vascular;  Laterality: Left;  PERIPHERAL NERVE BLOCK   BASCILIC VEIN TRANSPOSITION Left 07/31/2022   Procedure: LEFT ARM SECOND STAGE BASILIC VEIN TRANSPOSITION;  Surgeon: Nada Libman, MD;  Location: [facility] OR;  Service: Vascular;  Laterality: Left;   BIOPSY  05/27/2020   Procedure: BIOPSY;  Surgeon: Lanelle Bal, DO;  Location: [facility];  Service: Endoscopy;;   CATARACT EXTRACTION, BILATERAL Bilateral ?2013   COLONOSCOPY W/ POLYPECTOMY     COLONOSCOPY WITH PROPOFOL N/A 03/13/2019   Procedure: COLONOSCOPY WITH PROPOFOL;  Surgeon: Beverley Fiedler, MD;  Location: [facility] ENDOSCOPY;  Service: Gastroenterology;  Laterality: N/A;   CORONARY ANGIOPLASTY WITH STENT PLACEMENT  1999; 2007   "1 +  1"   ERCP N/A 02/03/2022   Procedure: ENDOSCOPIC RETROGRADE CHOLANGIOPANCREATOGRAPHY (ERCP);  Surgeon: Jeani Hawking, MD;  Location: [facility] ENDOSCOPY;  Service: Gastroenterology;  Laterality: N/A;   ESOPHAGOGASTRODUODENOSCOPY N/A 02/12/2023   Procedure: ESOPHAGOGASTRODUODENOSCOPY (EGD);  Surgeon: Jeani Hawking, MD;  Location: [facility] ENDOSCOPY;  Service: Gastroenterology;  Laterality: N/A;   ESOPHAGOGASTRODUODENOSCOPY (EGD) WITH PROPOFOL N/A 03/13/2019   Procedure: ESOPHAGOGASTRODUODENOSCOPY (EGD) WITH PROPOFOL;  Surgeon: Beverley Fiedler, MD;  Location: [facility] ENDOSCOPY;  Service: Gastroenterology;  Laterality: N/A;   ESOPHAGOGASTRODUODENOSCOPY (EGD) WITH PROPOFOL N/A 05/27/2020   Procedure: ESOPHAGOGASTRODUODENOSCOPY (EGD) WITH PROPOFOL;  Surgeon: Lanelle Bal, DO;  [facility];  Service: Endoscopy;  Laterality: N/A;   ESOPHAGOGASTRODUODENOSCOPY (EGD) WITH PROPOFOL N/A 09/03/2022   Procedure: ESOPHAGOGASTRODUODENOSCOPY (EGD) WITH PROPOFOL;  Surgeon: Jeani Hawking, MD;  Location: [facility] ENDOSCOPY;  Service: Gastroenterology;  Laterality: N/A;   EYE SURGERY Bilateral    lazer   FLEXIBLE SIGMOIDOSCOPY N/A 05/23/2022   Procedure: FLEXIBLE SIGMOIDOSCOPY;  Surgeon: Jeani Hawking, MD;  Location: [facility] ENDOSCOPY;  Service: Gastroenterology;  Laterality: N/A;   FLEXIBLE SIGMOIDOSCOPY N/A 05/24/2022   Procedure: FLEXIBLE SIGMOIDOSCOPY;  Surgeon: Imogene Burn, MD;  Location: [facility] ENDOSCOPY;  Service: Gastroenterology;  Laterality: N/A;   FLEXIBLE SIGMOIDOSCOPY N/A 09/03/2022   Procedure: FLEXIBLE SIGMOIDOSCOPY;  Surgeon: Jeani Hawking, MD;  Location: [facility] ENDOSCOPY;  Service: Gastroenterology;  Laterality: N/A;   HEMOSTASIS CLIP PLACEMENT  03/13/2019   Procedure: HEMOSTASIS CLIP PLACEMENT;  Surgeon: Beverley Fiedler, MD;  Location: [facility] ENDOSCOPY;  Service: Gastroenterology;;   HEMOSTASIS CLIP PLACEMENT  05/23/2022   Procedure: HEMOSTASIS CLIP PLACEMENT;  Surgeon: Jeani Hawking, MD;  Location: [facility] ENDOSCOPY;  Service:  Gastroenterology;;   HEMOSTASIS CONTROL  05/24/2022   Procedure: HEMOSTASIS CONTROL;  Surgeon: Imogene Burn, MD;  Location: [facility] ENDOSCOPY;  Service: Gastroenterology;;   HOT HEMOSTASIS N/A 05/23/2022   Procedure: HOT HEMOSTASIS (ARGON PLASMA COAGULATION/BICAP);  Surgeon: Jeani Hawking, MD;  Location: [facility] ENDOSCOPY;  Service: Gastroenterology;  Laterality: N/A;   I & D EXTREMITY Left 05/05/2022   Procedure: IRRIGATION AND DEBRIDEMENT LEFT ARM AV FISTULA;  Surgeon: Cephus Shelling, MD;  Location: [facility] OR;  Service: Vascular;  Laterality: Left;   I & D EXTREMITY N/A 11/14/2022   Procedure: IRRIGATION AND DEBRIDEMENT OF LOWER EXTREMITY WOUND;  Surgeon: Nadara Mustard, MD;  Location: [facility] OR;  Service: Orthopedics;  Laterality: N/A;   INSERTION OF DIALYSIS CATHETER Right 04/02/2022   Procedure: INSERTION OF TUNNELED DIALYSIS CATHETER;  Surgeon: Nada Libman, MD;  Location: [facility] OR;  Service: Vascular;  Laterality: Right;   KNEE ARTHROSCOPY Left 10/25/2006   POLYPECTOMY  03/13/2019   Procedure: POLYPECTOMY;  Surgeon:  Pyrtle, Carie Caddy, MD;  Location: [facility] ENDOSCOPY;  Service: Gastroenterology;;   REMOVAL OF STONES  02/03/2022   Procedure: REMOVAL OF STONES;  Surgeon: Jeani Hawking, MD;  Location: [facility] ENDOSCOPY;  Service: Gastroenterology;;   REVISON OF ARTERIOVENOUS FISTULA Left 08/20/2022   Procedure: REVISON OF LEFT ARM ARTERIOVENOUS FISTULA;  Surgeon: Nada Libman, MD;  Location: [facility] OR;  Service: Vascular;  Laterality: Left;   RIGHT HEART CATH N/A 07/24/2017   Procedure: RIGHT HEART CATH;  Surgeon: Dolores Patty, MD;  [facility];  Service: Cardiovascular;  Laterality: N/A;   RIGHT HEART CATHETERIZATION N/A 09/22/2013   Procedure: RIGHT HEART CATH;  Surgeon: Dolores Patty, MD;  Location: [facility] CATH LAB;  Service: Cardiovascular;  Laterality: N/A;   SHOULDER ARTHROSCOPY WITH OPEN ROTATOR CUFF REPAIR Right 03/14/2014   Procedure: RIGHT SHOULDER ARTHROSCOPY WITH BICEPS RELEASE,  OPEN SUBSCAPULA REPAIR, OPEN SUPRASPINATUS REPAIR.;  Surgeon: Cammy Copa, MD;  Location: [facility] OR;  Service: Orthopedics;  Laterality: Right;   SPHINCTEROTOMY  02/03/2022   Procedure: SPHINCTEROTOMY;  Surgeon: Jeani Hawking, MD;  Location: [facility] ENDOSCOPY;  Service: Gastroenterology;;   STUMP REVISION Right 06/13/2022   Procedure: REVISION RIGHT BELOW KNEE AMPUTATION;  Surgeon: Nadara Mustard, MD;  Location: [facility] OR;  Service: Orthopedics;  Laterality: Right;   TEE WITHOUT CARDIOVERSION N/A 02/04/2022   Procedure: TRANSESOPHAGEAL ECHOCARDIOGRAM (TEE);  Surgeon: Dolores Patty, MD;  Location: [facility] ENDOSCOPY;  Service: Cardiovascular;  Laterality: N/A;   THROMBECTOMY W/ EMBOLECTOMY Left 08/20/2022   Procedure: THROMBECTOMY OF LEFT ARM ARTERIOVENOUS FISTULA;  Surgeon: Nada Libman, MD;  Location: [facility] OR;  Service: Vascular;  Laterality: Left;   TOE AMPUTATION Right 02/24/2018   GREAT TOE AND 2ND TOE AMPUTATION   TUBAL LIGATION  1970's   Patient Active Problem List   Diagnosis Date Noted   Hyperosmolar hyperglycemic state (HHS) (HCC) 02/05/2023   Depression 01/29/2023   Nausea 01/24/2023   Intractable nausea and vomiting 01/20/2023   Proctitis 01/15/2023   Acute on chronic anemia 11/25/2022   Right below-knee amputee (HCC) 11/20/2022   Cellulitis of left lower extremity 11/14/2022   Maggot infestation 11/12/2022   Complicated wound infection 11/10/2022   Secondary hypercoagulable state (HCC) 09/04/2022   Right sided abdominal pain 08/31/2022   Constipation 06/07/2022   History of Clostridioides difficile colitis 06/06/2022   Below-knee amputation of right lower extremity (HCC) 06/06/2022   Diverticulitis 06/05/2022   Stercoral colitis 06/05/2022   C. difficile colitis 06/05/2022   Spleen hematoma 06/05/2022   Dehiscence of amputation stump of right lower extremity (HCC) 06/05/2022   Rectal ulcer 05/27/2022   ESRD (end stage renal disease) (HCC) 05/27/2022   GI bleed 05/23/2022    Difficult intravenous access 05/23/2022   Gangrene of right foot (HCC) 05/02/2022   S/P BKA (below knee amputation) unilateral, right (HCC) 05/02/2022   Unspecified protein-calorie malnutrition (HCC) 04/15/2022   Secondary hyperparathyroidism of renal origin (HCC) 04/14/2022   Coagulation defect, unspecified (HCC) 04/09/2022   Acquired absence of other left toe(s) (HCC) 04/07/2022   Allergy, unspecified, initial encounter 04/07/2022   Dependence on renal dialysis (HCC) 04/07/2022   Gout due to renal impairment, unspecified site 04/07/2022   Hypertensive heart and chronic kidney disease with heart failure and with stage 5 chronic kidney disease, or end stage renal disease (HCC) 04/07/2022   Personal history of transient ischemic attack (TIA), and cerebral infarction without residual deficits 04/07/2022   Renal osteodystrophy 04/07/2022   Venous stasis ulcer of right  calf (HCC) 03/31/2022   Fistula, colovaginal 03/26/2022   Diarrhea 03/26/2022   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   Sepsis without acute organ dysfunction (HCC)    Bacteremia    Acute pancreatitis 02/01/2022   Abdominal pain 02/01/2022   SIRS (systemic inflammatory response syndrome) (HCC) 02/01/2022   Transaminitis 02/01/2022   History of anemia due to chronic kidney disease 02/01/2022   Paroxysmal atrial fibrillation (HCC) 02/01/2022   Uncontrolled type 2 diabetes mellitus with hyperglycemia, with long-term current use of insulin (HCC) 01/14/2022   NSTEMI (non-ST elevated myocardial infarction) (HCC) 03/05/2021   Acute renal failure superimposed on stage 4 chronic kidney disease (HCC) 08/22/2020   Hypoalbuminemia 05/25/2020   GERD (gastroesophageal reflux disease) 05/25/2020   Pressure injury of skin 05/17/2020   Acute on chronic combined systolic and diastolic congestive heart failure (HCC) 03/07/2020   Type 2 diabetes mellitus with diabetic polyneuropathy, with long-term current use of insulin (HCC) 03/07/2020    Obesity, Class III, BMI 40-49.9 (morbid obesity) (HCC) 03/07/2020   Common bile duct (CBD) obstruction 05/28/2019   Benign neoplasm of ascending colon    Benign neoplasm of transverse colon    Benign neoplasm of descending colon    Benign neoplasm of sigmoid colon    Gastric polyps    Hyperkalemia 03/11/2019   Prolonged QT interval 03/11/2019   Acute blood loss anemia 03/11/2019   Onychomycosis 06/21/2018   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)    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 (HCC)    PVD (peripheral vascular disease) (HCC) 10/26/2017   E-coli UTI 07/27/2017   Hypothyroidism 07/27/2017   AKI (acute kidney injury) (HCC)    PAH (pulmonary artery hypertension) (HCC)    Impaired ambulation 07/19/2017   Nausea & vomiting 07/15/2017   Leg cramps 02/27/2017   Peripheral edema 01/12/2017   Diabetic neuropathy (HCC) 11/12/2016   CKD (chronic kidney disease), stage IV (HCC) 10/24/2015   Anemia of chronic disease 10/03/2015   Historical diagnosis of generalized anxiety disorder 10/03/2015   Secondary insomnia 10/03/2015   Hyperglycemia due to diabetes mellitus (HCC) 06/07/2015   Non compliance with medical treatment 04/17/2014   Rotator cuff tear 03/14/2014   Obesity 09/23/2013   Chronic HFrEF (heart failure with reduced ejection fraction) (HCC) 06/03/2013   Hypotension 12/25/2012   Hyponatremia 12/25/2012   Urinary incontinence    MDD (major depressive disorder), recurrent episode, moderate (HCC) 11/12/2010   RBBB (right bundle branch block)    Wide-complex tachycardia    Coronary artery disease    Hyperlipemia 01/22/2009   Chronic hypotension 01/22/2009    ONSET DATE: 03/09/2023 MD referral to PT  REFERRING DIAG: S88.111D (ICD-10-CM) - Below-knee amputation of right lower extremity, subsequent encounter   THERAPY DIAG:  Unsteadiness on feet  Muscle weakness (generalized)  Phantom limb syndrome with pain (HCC)  Other abnormalities of  gait and mobility  Impaired functional mobility, balance, gait, and endurance  Rationale for Evaluation and Treatment: Rehabilitation  SUBJECTIVE:   SUBJECTIVE STATEMENT: This 72yo female underwent a right Transtibial Amputaion 05/02/2022 and revision 06/13/2022. She received her first prosthesis on 02/04/2023. She had wounds on left leg that slowed use of prosthesis due to limited WB. She wears prosthesis on non-dailysis days from 2 hours to most of awake hours but that makes limb hurt.  Pt accompanied by: husband, Ramon Dredge "Joe" Bechtold  PERTINENT HISTORY: Right TTA 05/02/22, Lymphedema, idiopathic chronic venous HTN with ulcer, depression, colitis, diverticulitis, ESRD, gout, NSTEMI, DM2, polyneuropathy, PVD, CAD,  right bundle branch block, mini strokes  PAIN:  Are you having pain? Phantom pain with no known aggravation.   PRECAUTIONS: Fall and Other: No BP LUE  WEIGHT BEARING RESTRICTIONS: No  FALLS: Has patient fallen in last 6 months? No  LIVING ENVIRONMENT: Lives with: lives with their spouse and 2 small dogs Lives in: mobile home Home Access: Ramped entrance Home layout: One level Stairs: Yes: External: 6-7 steps; can reach both Has following equipment at home: Single point cane, Environmental consultant - 2 wheeled, Environmental consultant - 4 wheeled, Wheelchair (manual), [employer], Grab bars, and Ramped entry  OCCUPATION:  retired  PLOF: prior to amputation for ~year used RW in home & community  PATIENT GOALS:  to use prosthesis to walk, get out w/c in & out of car, get to bathroom.   OBJECTIVE:  COGNITION: Overall cognitive status:  Eval on 03/23/2023:   Within functional limits for tasks assessed  POSTURE:  Eval on 03/23/2023:  rounded shoulders, forward head, increased thoracic kyphosis, flexed trunk , and weight shift left  LOWER EXTREMITY ROM: ROM Right eval Left eval  Hip flexion    Hip extension    Hip abduction    Hip adduction    Hip internal rotation    Hip external rotation    Knee  flexion    Knee extension    Ankle dorsiflexion    Ankle plantarflexion    Ankle inversion    Ankle eversion     (Blank rows = not tested)  LOWER EXTREMITY MMT:  MMT Right eval Left eval  Hip flexion 3-/5 3-/5  Hip extension 2/5 2/5  Hip abduction 2+/5 2+/5  Hip adduction    Hip internal rotation    Hip external rotation    Knee flexion 3-/5 3-/5  Knee extension 3-/5 3-/5  Ankle dorsiflexion    Ankle plantarflexion    Ankle inversion    Ankle eversion    At Evaluation all strength testing is grossly seated and functionally standing / gait. (Blank rows = not tested)  TRANSFERS:  Eval on 03/23/2023:  Sit to stand: Max A (two-person assist) from 22" w/c to rolling walker Stand to sit: Max A (two-person assist) rolling walker to wheelchair  FUNCTIONAL TESTs:   Eval on 03/23/2023: Patient maintained upright holding rolling walker with mod assist 2 people for safety for 30 seconds.  GAIT: Eval on 03/23/2023:  Patient took 2 steps (one per LE) with +2 max assist with rolling walker and TTA prosthesis.  Patient adducting prosthesis stepping on left foot with no awareness.  CURRENT PROSTHETIC WEAR ASSESSMENT:  Eval on 03/23/2023:   Patient is dependent with: skin check, residual limb care, prosthetic cleaning, ply sock cleaning, correct ply sock adjustment, proper wear schedule/adjustment, and proper weight-bearing schedule/adjustment Donning prosthesis: Max A Doffing prosthesis: Min A Prosthetic wear tolerance: 2-6 hours, 1x/day, 3-4 days/week Prosthetic weight bearing tolerance: 3 minutes with limb pain Edema: pitting edema Residual limb condition: No open areas, dry skin, normal color and temperature, cylindrical shape Prosthetic description: Silicone liner with pin lock suspension, total contact socket with flexible inner socket, sach foot    TODAY'S TREATMENT:  DATE:  03/23/2023: Prosthetic Training with Transtibial Amputation: See patient education noted below  PATIENT EDUCATION: PATIENT EDUCATED ON FOLLOWING PROSTHETIC CARE: Education details: Use of stockinette under proximal liner to decrease itching sensation, no wounds presents to PT recommended not using Vive wear under liner Skin check, Prosthetic cleaning, Propper donning, and Proper wear schedule/adjustment Prosthetic wear tolerance: 3 hours 2x/day, 4 days/week Person educated: Patient and Spouse Education method: Explanation, Tactile cues, and Verbal cues Education comprehension: verbalized understanding, verbal cues required, tactile cues required, and needs further education  HOME EXERCISE PROGRAM:  ASSESSMENT:  CLINICAL IMPRESSION: Patient is a 72 y.o. female who was seen today for physical therapy evaluation and treatment for prosthetic training with right Transtibial Amputation.  This patient is dependent in prosthetic care and has limited wear prosthesis which limits function safely throughout her day.  Patient has significant weakness and limited endurance for functional activities.  Patient requires +2 assist for sit to/from stand and requires +2 assist to maintain upright for 30 seconds.  Patient is currently nonambulatory.  Patient is dependent in all transfers including reporting using a Hoyer lift for transfers at dialysis.  Patient would benefit from skilled physical therapy to improve function and safety with prosthesis.  OBJECTIVE IMPAIRMENTS: Abnormal gait, decreased activity tolerance, decreased balance, decreased endurance, decreased knowledge of condition, decreased knowledge of use of DME, decreased mobility, difficulty walking, decreased ROM, decreased strength, decreased safety awareness, postural dysfunction, prosthetic dependency , and pain.   ACTIVITY LIMITATIONS: standing, transfers, and locomotion level  PARTICIPATION LIMITATIONS: community  activity, household mobility and dependency / burden of care on family  PERSONAL FACTORS: Age, Fitness, Past/current experiences, Time since onset of injury/illness/exacerbation, and 3+ comorbidities: see PMH  are also affecting patient's functional outcome.   REHAB POTENTIAL: Good  CLINICAL DECISION MAKING: Evolving/moderate complexity  EVALUATION COMPLEXITY: Moderate   GOALS: Goals reviewed with patient? Yes  SHORT TERM GOALS: Target date: 04/22/2023:  Patient donnes prosthesis correctly with husband's assist & verbalizes proper cleaning. Baseline: SEE OBJECTIVE DATA Goal status: INITIAL 2.  Patient tolerates prosthesis wear on non-dailysis days >8 hrs total /day and on dialysis days >2 hours/day without skin issues or limb pain >5/10. Baseline: SEE OBJECTIVE DATA Goal status: INITIAL  3.  Patient able to stand with rolling walker with 1 person modA for 1 min.  Baseline: SEE OBJECTIVE DATA Goal status: INITIAL  4. Patient ambulates 5' with RW & prosthesis with +2 maxA.  Baseline: SEE OBJECTIVE DATA Goal status: INITIAL  5. Patient sit to/from stand transfer with +1 maxA and scooting transfer with modA. Baseline: SEE OBJECTIVE DATA Goal status: INITIAL  LONG TERM GOALS: Target date: 06/17/2022  Patient & husband demonstrates & verbalizes understanding of prosthetic care to enable safe utilization of prosthesis. Baseline: SEE OBJECTIVE DATA Goal status: INITIAL  Patient tolerates prosthesis wear >80% of awake hours on non-dialysis days and >50% on dialysis days without skin issues and limb pain </= 4/10. Baseline: SEE OBJECTIVE DATA Goal status: INITIAL  Scooting transfers with minA and sit to/from stand transfers with modA.  Baseline: SEE OBJECTIVE DATA Goal status: INITIAL  Patient ambulates >10' with prosthesis & RW with modA.  Baseline: SEE OBJECTIVE DATA Goal status: INITIAL  Patient stands with RW support with minA or less for 2 min.  Baseline: SEE OBJECTIVE  DATA Goal status: INITIAL   PLAN:  PT FREQUENCY: 2x/week  PT DURATION: 12 weeks  PLANNED INTERVENTIONS: [id number]- PT Re-evaluation, 97110-Therapeutic exercises, 97530- Therapeutic activity, [id number]- Neuromuscular re-education, 97535- Self Care,  [id number]- Gait training, [id number]- Prosthetic training, Patient/Family education, Balance training, DME instructions, Therapeutic exercises, Therapeutic activity, Neuromuscular re-education, Gait training, and Self Care  PLAN FOR NEXT SESSION: review prosthetic care, set up HEP seated & supine for strength, work on sit/stand at sink.   Vladimir Faster, PT, DPT 03/23/2023, 4:52 PM

## 2023-03-24 ENCOUNTER — Other Ambulatory Visit: Payer: Self-pay | Admitting: *Deleted

## 2023-03-24 DIAGNOSIS — N186 End stage renal disease: Secondary | ICD-10-CM

## 2023-03-24 DIAGNOSIS — N2581 Secondary hyperparathyroidism of renal origin: Secondary | ICD-10-CM | POA: Diagnosis not present

## 2023-03-24 DIAGNOSIS — E876 Hypokalemia: Secondary | ICD-10-CM | POA: Diagnosis not present

## 2023-03-24 DIAGNOSIS — D689 Coagulation defect, unspecified: Secondary | ICD-10-CM | POA: Diagnosis not present

## 2023-03-24 DIAGNOSIS — Z992 Dependence on renal dialysis: Secondary | ICD-10-CM | POA: Diagnosis not present

## 2023-03-25 DIAGNOSIS — M6281 Muscle weakness (generalized): Secondary | ICD-10-CM | POA: Diagnosis not present

## 2023-03-25 DIAGNOSIS — I5043 Acute on chronic combined systolic (congestive) and diastolic (congestive) heart failure: Secondary | ICD-10-CM | POA: Diagnosis not present

## 2023-03-25 DIAGNOSIS — I5042 Chronic combined systolic (congestive) and diastolic (congestive) heart failure: Secondary | ICD-10-CM | POA: Diagnosis not present

## 2023-03-25 DIAGNOSIS — K626 Ulcer of anus and rectum: Secondary | ICD-10-CM | POA: Diagnosis not present

## 2023-03-25 DIAGNOSIS — K219 Gastro-esophageal reflux disease without esophagitis: Secondary | ICD-10-CM | POA: Diagnosis not present

## 2023-03-25 DIAGNOSIS — J9611 Chronic respiratory failure with hypoxia: Secondary | ICD-10-CM | POA: Diagnosis not present

## 2023-03-26 ENCOUNTER — Other Ambulatory Visit: Payer: Self-pay | Admitting: Family Medicine

## 2023-03-26 DIAGNOSIS — Z992 Dependence on renal dialysis: Secondary | ICD-10-CM | POA: Diagnosis not present

## 2023-03-26 DIAGNOSIS — N179 Acute kidney failure, unspecified: Secondary | ICD-10-CM | POA: Diagnosis not present

## 2023-03-26 DIAGNOSIS — N186 End stage renal disease: Secondary | ICD-10-CM | POA: Diagnosis not present

## 2023-03-26 NOTE — Progress Notes (Signed)
Cardiology Office Note    Patient Name: Susan [name] Date of Encounter: 03/26/2023  Primary Care Provider:  Donita Brooks, MD Primary Cardiologist:  Arvilla Meres, MD Primary Electrophysiologist: None   Past Medical History    Past Medical History:  Diagnosis Date   Anemia    hx   Anxiety    Arthritis    "generalized" (03/15/2014)   CAD (coronary artery disease)    MI in 2000 - MI  2007 - treated bare metal stent (no nuclear since then as 9/11)   Carotid artery disease (HCC)    Chronic diastolic heart failure (HCC)    a) ECHO (08/2013) EF 55-60% and RV function nl b) RHC (08/2013) RA 4, RV 30/5/7, PA 25/10 (16), PCWP 7, Fick CO/CI 6.3/2.7, PVR 1.5 WU, PA 61 and 66%   Daily headache    "~ every other day; since I fell in June" (03/15/2014)   Depression    Diabetic retinopathy (HCC)    Dyslipidemia    ESRD (end stage renal disease) (HCC)    Dialysis on Tues Thurs Sat   Exertional shortness of breath    History of kidney stones    HTN (hypertension)    Hypothyroidism    Obesity    Osteoarthritis    PAF (paroxysmal atrial fibrillation) (HCC)    Peripheral neuropathy    bilateral feet/hands   PONV (postoperative nausea and vomiting)    RBBB (right bundle branch block)    Old   Stroke (HCC)    mini strokes   Type II diabetes mellitus (HCC)    Type II, [name]. patient has omnipod insulin pump with Novolin R Insulin    History of Present Illness  Susan [name] is a 72 y.o. female with a PMH of CAD s/p MI 1999 and NSTEMI 2007 with BMS to RCA , chronic combined CHF, ESRD on HD (TTS), TIA, HFpEF, RBBB, hypothyroidism, DM type II, paroxysmal AF/flutter, PAD s/p right below the knee amputation 04/2022  Susan [name] was previously followed by Dr. Myrtis Ser since the late 90s and is currently followed by Dr. Eden Emms  as well as advanced heart failure clinic for management of CHF and CAD.  She has a history of MI in 1999, 01/1999 and again in 2007 with a BMS  placed in 2007.  She underwent her last ischemic evaluation in 2018 by Lexiscan Myoview that was normal and underwent a RHC 07/2017 that showed normal left-sided pressures with mild PAH and normal cardiac output.  She was admitted 02/2021 for NSTEMI and 2D echo showed EF of 30 to 35% with new inferior wall abnormality.  LHC was deferred at that time due to CKD stage IV with RCA disease suspected and managed medically.  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  During admission patient required 4 units of PRBCs due to hypotension.  She was readmitted 1/thousand 24 with abdominal pain with nausea and vomiting with workup showing severe constipation with stercoral colitis and positive C. difficile.  She was treated with antibiotics and underwent right BKA revision wound VAC placed due to dehiscence.  She was admitted 08/2022 with nausea and vomiting with abdominal pain.  She underwent CTA of the abdomen showing splenic infarct and was treated with antibiotics and PPI.  During hospitalization patient developed wide-complex VT with atrial flutter.  She was treated by rapid response team and given a bolus  of amiodarone.  She reported palpitations, shortness of breath and diaphoresis as well as lightheadedness but no syncope.  2D echo was completed showing EF of 30-35% and patient was transition to p.o. amiodarone and started on midodrine due to hypotension.  She was discharged on Eliquis and deemed not a candidate for ICD/device due to active infection and ESRD with HD line.  She was last seen 10/08/2022 by Boyce Medici, PA and was noted to have ongoing elevated glucose readings and continuing to require midodrine for BP support.  Her volume was controlled well with HD and GDMT was limited due to CKD and hypertension.  She was admitted 10/2022 for irrigation debridement of lower extremity ulcer on left medial calf and leg after developing  necrotic tissue with maggots.  She was started on IV Zosyn and vancomycin underwent debridement with placement of a wound VAC.  Palliative care was consulted to discuss goals of care and blood cultures were negative for Proteus and Klebsiella Enterococcus.  She was transition to inpatient rehab with improvement to balance postural control and was discharged home on 12/11/2022.  She was admitted 01/20/2023 with recurrent nausea and vomiting.  EKG showed prolonged QTc QTc of 483 nausea and vomiting found to be secondary to gastroparesis flare.  CT of the abdomen and pelvis was unremarkable.  During today's visit the patient reports that she has been experiencing bouts of chest pain that feels similar to gas pain.  She also reports occasional skipped beats and more prevalent palpitations following dialysis.  Her blood pressure today was controlled at 114/58 and patient has been compliant with midodrine.  She is scheduled to resume PT and OT next week and was currently not on Eliquis for the past week due to problems with obtaining a refill.  She appears euvolemic on exam today and reports maintaining good p.o. intake and manages to monitor her salt intake.  She is currently having her fluid volume managed by dialysis.  Patient denies chest pain, palpitations, dyspnea, PND, orthopnea, nausea, vomiting, dizziness, syncope, edema, weight gain, or early satiety.  Review of Systems  Please see the history of present illness.    All other systems reviewed and are otherwise negative except as noted above.  Physical Exam    Wt Readings from Last 3 Encounters:  02/17/23 158 lb 11.7 oz (72 kg)  02/02/23 179 lb 0.2 oz (81.2 kg)  01/17/23 174 lb 13.2 oz (79.3 kg)   UV:OZDGU were no vitals filed for this visit.,There is no height or weight on file to calculate BMI. GEN: Well nourished, well developed in no acute distress Neck: No JVD; No carotid bruits Pulmonary: Clear to auscultation without rales, wheezing or  rhonchi  Cardiovascular: Normal rate. Regular rhythm. Normal S1. Normal S2.   Murmurs: There is no murmur.  ABDOMEN: Soft, non-tender, non-distended EXTREMITIES:  No edema; No deformity   EKG/LABS/ Recent Cardiac Studies   ECG personally reviewed by me today -sinus rhythm with rate of 64 bpm and left axis deviation with RBBB and prolonged QT interval which was corrected at 386 due to bundle branch block  Risk Assessment/Calculations:    CHA2DS2-VASc Score = 8   This indicates a 10.8% annual risk of stroke. The patient's score is based upon: CHF History: 1 HTN History: 1 Diabetes History: 1 Stroke History: 2 (splenic infarct) Vascular Disease History: 1 Age Score: 1 Gender Score: 1         Lab Results  Component Value Date   WBC 5.2 02/17/2023  HGB 10.1 (L) 02/17/2023   HCT 33.4 (L) 02/17/2023   MCV 81.1 02/17/2023   PLT 186 02/17/2023   Lab Results  Component Value Date   CREATININE 4.98 (H) 02/17/2023   BUN 35 (H) 02/17/2023   NA 127 (L) 02/17/2023   K 3.8 02/17/2023   CL 94 (L) 02/17/2023   CO2 25 02/17/2023   Lab Results  Component Value Date   CHOL 92 02/01/2022   HDL 34 (L) 02/01/2022   LDLCALC 29 02/01/2022   TRIG 143 02/01/2022   CHOLHDL 2.7 02/01/2022    Lab Results  Component Value Date   HGBA1C 9.0 (H) 02/27/2023   Assessment & Plan    1.  Coronary artery disease: -s/p in 2000 and NSTEMI in 2007 with BMS to RCA most recent ischemic evaluation completed in 2018 via Myoview -Today patient is not having chest pain however reports ongoing discomfort that she describes as gas-like pain. -Due to her history we will have patient complete a Lexiscan Myoview for further evaluation. Continue Lipitor 80 mg daily, carvedilol 6.25 mg Monday, Wednesday, Friday -Patient was provided prescription for Nitrostat 0.4 mg as needed -ED precautions were advised and patient is aware to seek care if chest pain returns and is not relieved with as needed  Nitrostat.  2.  Chronic combined CHF: -Echo 4/24 EF 30-35%, RV mildly reduced, moderate TR  - unable to ascertain NYHA Functional Class as pt currently WC -Patient's volume currently managed by HD -Patient will change carvedilol to 6.25 mg twice daily Monday, Wednesday, Friday -Low sodium diet, fluid restriction <2L, and daily weights encouraged. Educated to contact our office for weight gain of 2 lbs overnight or 5 lbs in one week.   3.  Paroxysmal AF/flutter: -Patient currently on rate control with amiodarone -Continue amiodarone 200 mg daily and carvedilol 6.25 mg Monday, Wednesday, Friday -Continue Eliquis 5 mg twice daily -CHA2DS2-VASc Score = 8 [CHF History: 1, HTN History: 1, Diabetes History: 1, Stroke History: 2 (splenic infarct), Vascular Disease History: 1, Age Score: 1, Gender Score: 1].  Therefore, the patient's annual risk of stroke is 10.8 %.      4.  Poorly controlled DM type II: -Patient's last hemoglobin A1c was 9 -Continue treatment plan per PCP  5.  ESRD: -Patient has HD on T, TH, SAT -She reports dizziness and palpitations following dialysis along with low BP -She will change carvedilol to Monday, Wednesday, Friday  6.  QT prolongation: -Patient's QT interval was corrected based on bundle branch block and known prolongation due to subsequent amiodarone use. -Patient will continue current medications as prescribed  Disposition: Follow-up with Arvilla Meres, MD or APP in 3 months Informed Consent   Shared Decision Making/Informed Consent The risks [chest pain, shortness of breath, cardiac arrhythmias, dizziness, blood pressure fluctuations, myocardial infarction, stroke/transient ischemic attack, nausea, vomiting, allergic reaction, radiation exposure, metallic taste sensation and life-threatening complications (estimated to be 1 in 10,000)], benefits (risk stratification, diagnosing coronary artery disease, treatment guidance) and alternatives of a nuclear stress  test were discussed in detail with Ms. Buske and she agrees to proceed.      Signed, Napoleon Form, Leodis Rains, NP 03/26/2023, 1:08 PM [facility]

## 2023-03-26 NOTE — Telephone Encounter (Signed)
Prescription Request  03/26/2023  LOV: 02/27/2023  What is the name of the medication or equipment? apixaban (ELIQUIS) 5 MG TABS tablet , ferric citrate, and midodrine 10 mg the last two came from when she was in the hospital   Have you contacted your pharmacy to request a refill? Yes   Which pharmacy would you like this sent to?  [id number] [place], [place] - [address] [place] [address] Phone: [phone] Fax: [phone]    Patient notified that their request is being sent to the clinical staff for review and that they should receive a response within 2 business days.   Please advise at [facility] [phone]

## 2023-03-26 NOTE — Telephone Encounter (Signed)
Requested medications are due for refill today.  unsure  Requested medications are on the active medications list.  Some are  Last refill. varied  Future visit scheduled.   no  Notes to clinic.  Please review for refill. Pt also requesting:  ferric citrate and midodrine 10 mg not on current list, routing for approval. Eliquis has been routed to [facility] for refill.   Requested Prescriptions  Pending Prescriptions Disp Refills   apixaban (ELIQUIS) 5 MG TABS tablet 60 tablet 0    Sig: Take 1 tablet (5 mg total) by mouth 2 (two) times daily.     Hematology:  Anticoagulants - apixaban Failed - 03/26/2023 11:59 AM      Failed - HGB in normal range and within 360 days    Hemoglobin  Date Value Ref Range Status  02/17/2023 10.1 (L) 12.0 - 15.0 g/dL Final         Failed - HCT in normal range and within 360 days    HCT  Date Value Ref Range Status  02/17/2023 33.4 (L) 36.0 - 46.0 % Final         Failed - Cr in normal range and within 360 days    Creat  Date Value Ref Range Status  03/26/2022 6.22 (H) 0.60 - 1.00 mg/dL Final   Creatinine, Ser  Date Value Ref Range Status  02/17/2023 4.98 (H) 0.44 - 1.00 mg/dL Final   Creatinine, Urine  Date Value Ref Range Status  08/14/2020 35.12 mg/dL Final    Comment:    Performed at [facility], [address], [place], [place] [address]         Failed - Valid encounter within last 12 months    Recent Outpatient Visits           1 year ago Cellulitis of lower extremity, unspecified laterality   [facility] [name], Priscille Heidelberg, MD   1 year ago Diarrhea, unspecified type   [facility] [name], Priscille Heidelberg, MD   1 year ago Dysuria   [facility] Tanya Nones, Priscille Heidelberg, MD   1 year ago Stage 4 chronic kidney disease (HCC)   [facility] Donita Brooks, MD   1 year ago Chronic diastolic CHF (congestive heart failure) (HCC)   [facility] [name], Priscille Heidelberg, MD        Future Appointments             Tomorrow Louanne Skye Devoria Albe., NP [facility]            Passed - PLT in normal range and within 360 days    Platelets  Date Value Ref Range Status  02/17/2023 186 150 - 400 K/uL Final         Passed - AST in normal range and within 360 days    AST  Date Value Ref Range Status  02/14/2023 19 15 - 41 U/L Final         Passed - ALT in normal range and within 360 days    ALT  Date Value Ref Range Status  02/14/2023 18 0 - 44 U/L Final

## 2023-03-26 NOTE — Telephone Encounter (Signed)
ferric citrate and midodrine 10 mg not on current list, routing for approval. Eliquis has been routed to [facility] for refill.

## 2023-03-27 ENCOUNTER — Encounter (HOSPITAL_COMMUNITY): Payer: PPO

## 2023-03-27 ENCOUNTER — Ambulatory Visit: Payer: PPO

## 2023-03-27 ENCOUNTER — Ambulatory Visit: Payer: PPO | Attending: Nurse Practitioner | Admitting: Nurse Practitioner

## 2023-03-27 ENCOUNTER — Encounter: Payer: Self-pay | Admitting: Nurse Practitioner

## 2023-03-27 VITALS — BP 114/58 | HR 64 | Ht 65.0 in | Wt 230.0 lb

## 2023-03-27 DIAGNOSIS — Z794 Long term (current) use of insulin: Secondary | ICD-10-CM

## 2023-03-27 DIAGNOSIS — R9431 Abnormal electrocardiogram [ECG] [EKG]: Secondary | ICD-10-CM

## 2023-03-27 DIAGNOSIS — I5022 Chronic systolic (congestive) heart failure: Secondary | ICD-10-CM

## 2023-03-27 DIAGNOSIS — I480 Paroxysmal atrial fibrillation: Secondary | ICD-10-CM

## 2023-03-27 DIAGNOSIS — Z992 Dependence on renal dialysis: Secondary | ICD-10-CM | POA: Diagnosis not present

## 2023-03-27 DIAGNOSIS — N186 End stage renal disease: Secondary | ICD-10-CM | POA: Diagnosis not present

## 2023-03-27 DIAGNOSIS — I2510 Atherosclerotic heart disease of native coronary artery without angina pectoris: Secondary | ICD-10-CM | POA: Diagnosis not present

## 2023-03-27 DIAGNOSIS — I5032 Chronic diastolic (congestive) heart failure: Secondary | ICD-10-CM

## 2023-03-27 DIAGNOSIS — E1165 Type 2 diabetes mellitus with hyperglycemia: Secondary | ICD-10-CM | POA: Diagnosis not present

## 2023-03-27 MED ORDER — NITROGLYCERIN 0.4 MG SL SUBL: 0.4000 mg | SUBLINGUAL_TABLET | SUBLINGUAL | 3 refills | Status: DC | PRN

## 2023-03-27 MED ORDER — APIXABAN 5 MG PO TABS: 5.0000 mg | ORAL_TABLET | Freq: Two times a day (BID) | ORAL | 5 refills | Status: DC

## 2023-03-27 NOTE — Patient Instructions (Addendum)
Medication Instructions:  START Nitroglycerin 0.4mg  Take 1 as needed for emergency chest pain. Take first dose for emergency chest pain; WAIT 5 minutes and then take 2nd dose. IF still having pain if still having chest pain CALL 911 wait an additional 5 minutes before taking final dose. Do not take more than 3 doses in a day. CHANGE Coreg to Monday, Wednesday & Fridays (non dialysis days)   *If you need a refill on your cardiac medications before your next appointment, please call your pharmacy*   Lab Work: None ordered   Testing/Procedures: Your physician has requested that you have a lexiscan myoview. For further information please visit [email or web address] Please follow instruction sheet, as given.   Follow-Up: At [facility], you and your health needs are our priority.  As part of our continuing mission to provide you with exceptional heart care, we have created designated Provider Care Teams.  These Care Teams include your primary Cardiologist (physician) and Advanced Practice Providers (APPs -  Physician Assistants and Nurse Practitioners) who all work together to provide you with the care you need, when you need it.  We recommend signing up for the patient portal called "MyChart".  Sign up information is provided on this After Visit Summary.  MyChart is used to connect with patients for Virtual Visits (Telemedicine).  Patients are able to view lab/test results, encounter notes, upcoming appointments, etc.  Non-urgent messages can be sent to your provider as well.   To learn more about what you can do with MyChart, go to [email or web address].    Your next appointment:   3 month(s)  Provider:   Charlton Haws, MD  Other Instructions

## 2023-03-28 DIAGNOSIS — N186 End stage renal disease: Secondary | ICD-10-CM | POA: Diagnosis not present

## 2023-03-28 DIAGNOSIS — Z992 Dependence on renal dialysis: Secondary | ICD-10-CM | POA: Diagnosis not present

## 2023-03-28 DIAGNOSIS — D689 Coagulation defect, unspecified: Secondary | ICD-10-CM | POA: Diagnosis not present

## 2023-03-28 DIAGNOSIS — N2581 Secondary hyperparathyroidism of renal origin: Secondary | ICD-10-CM | POA: Diagnosis not present

## 2023-03-30 ENCOUNTER — Encounter: Payer: Self-pay | Admitting: Physical Therapy

## 2023-03-30 ENCOUNTER — Ambulatory Visit: Payer: PPO | Admitting: Physical Therapy

## 2023-03-30 DIAGNOSIS — R2681 Unsteadiness on feet: Secondary | ICD-10-CM | POA: Diagnosis not present

## 2023-03-30 DIAGNOSIS — G546 Phantom limb syndrome with pain: Secondary | ICD-10-CM | POA: Diagnosis not present

## 2023-03-30 DIAGNOSIS — Z7409 Other reduced mobility: Secondary | ICD-10-CM | POA: Diagnosis not present

## 2023-03-30 DIAGNOSIS — M6281 Muscle weakness (generalized): Secondary | ICD-10-CM | POA: Diagnosis not present

## 2023-03-30 DIAGNOSIS — R2689 Other abnormalities of gait and mobility: Secondary | ICD-10-CM | POA: Diagnosis not present

## 2023-03-30 NOTE — Therapy (Signed)
OUTPATIENT PHYSICAL THERAPY PROSTHETIC TREATMENT   Patient Name: Susan [name] MRN: [id number] DOB:08-05-1950, 72 y.o., female Today's Date: 03/30/2023  PCP: Donita Brooks, MD  REFERRING PROVIDER: Nadara Mustard, MD  END OF SESSION:  PT End of Session - 03/30/23 [phone]     Visit Number 2    Number of Visits 25    Date for PT Re-Evaluation 06/18/23    Authorization Type [employer]    Progress Note Due on Visit 10    PT Start Time 0930    PT Stop Time 1018    PT Time Calculation (min) 48 min    Equipment Utilized During Treatment Gait belt    Activity Tolerance Patient tolerated treatment well;Patient limited by fatigue    Behavior During Therapy WFL for tasks assessed/performed              Past Medical History:  Diagnosis Date   Anemia    hx   Anxiety    Arthritis    "generalized" (03/15/2014)   CAD (coronary artery disease)    MI in 2000 - MI  2007 - treated bare metal stent (no nuclear since then as 9/11)   Carotid artery disease (HCC)    Chronic diastolic heart failure (HCC)    a) ECHO (08/2013) EF 55-60% and RV function nl b) RHC (08/2013) RA 4, RV 30/5/7, PA 25/10 (16), PCWP 7, Fick CO/CI 6.3/2.7, PVR 1.5 WU, PA 61 and 66%   Daily headache    "~ every other day; since I fell in June" (03/15/2014)   Depression    Diabetic retinopathy (HCC)    Dyslipidemia    ESRD (end stage renal disease) (HCC)    Dialysis on Tues Thurs Sat   Exertional shortness of breath    History of kidney stones    HTN (hypertension)    Hypothyroidism    Obesity    Osteoarthritis    PAF (paroxysmal atrial fibrillation) (HCC)    Peripheral neuropathy    bilateral feet/hands   PONV (postoperative nausea and vomiting)    RBBB (right bundle branch block)    Old   Stroke (HCC)    mini strokes   Type II diabetes mellitus (HCC)    Type II, [name]. patient has omnipod insulin pump with Novolin R Insulin   Past Surgical History:  Procedure  Laterality Date   A/V FISTULAGRAM Left 11/07/2022   Procedure: A/V Fistulagram;  Surgeon: Leonie Douglas, MD;  [facility]: [facility];  Service: Cardiovascular;  Laterality: Left;   ABDOMINAL HYSTERECTOMY  1980's   AMPUTATION Right 02/24/2018   Procedure: RIGHT FOOT GREAT TOE AND 2ND TOE AMPUTATION;  Surgeon: Nadara Mustard, MD;  Location: [facility] OR;  Service: Orthopedics;  Laterality: Right;   AMPUTATION Right 04/30/2018   Procedure: RIGHT TRANSMETATARSAL AMPUTATION;  Surgeon: Nadara Mustard, MD;  Location: [facility] OR;  Service: Orthopedics;  Laterality: Right;   AMPUTATION Right 05/02/2022   Procedure: RIGHT BELOW KNEE AMPUTATION;  Surgeon: Nadara Mustard, MD;  Location: [facility] OR;  Service: Orthopedics;  Laterality: Right;   APPLICATION OF WOUND VAC Right 06/13/2022   Procedure: APPLICATION OF WOUND VAC;  Surgeon: Nadara Mustard, MD;  Location: [facility] OR;  Service: Orthopedics;  Laterality: Right;   APPLICATION OF WOUND VAC Left 11/14/2022   Procedure: APPLICATION OF WOUND VAC;  Surgeon: Nadara Mustard, MD;  Location: [facility] OR;  Service: Orthopedics;  Laterality: Left;   AV FISTULA PLACEMENT  Left 04/02/2022   Procedure: LEFT ARM ARTERIOVENOUS (AV) FISTULA CREATION;  Surgeon: Nada Libman, MD;  Location: [facility] OR;  Service: Vascular;  Laterality: Left;  PERIPHERAL NERVE BLOCK   BASCILIC VEIN TRANSPOSITION Left 07/31/2022   Procedure: LEFT ARM SECOND STAGE BASILIC VEIN TRANSPOSITION;  Surgeon: Nada Libman, MD;  Location: [facility] OR;  Service: Vascular;  Laterality: Left;   BIOPSY  05/27/2020   Procedure: BIOPSY;  Surgeon: Lanelle Bal, DO;  Location: [facility];  Service: Endoscopy;;   CATARACT EXTRACTION, BILATERAL Bilateral ?2013   COLONOSCOPY W/ POLYPECTOMY     COLONOSCOPY WITH PROPOFOL N/A 03/13/2019   Procedure: COLONOSCOPY WITH PROPOFOL;  Surgeon: Beverley Fiedler, MD;  Location: [facility] ENDOSCOPY;  Service: Gastroenterology;  Laterality: N/A;   CORONARY ANGIOPLASTY WITH STENT PLACEMENT  1999; 2007   "1 +  1"   ERCP N/A 02/03/2022   Procedure: ENDOSCOPIC RETROGRADE CHOLANGIOPANCREATOGRAPHY (ERCP);  Surgeon: Jeani Hawking, MD;  Location: [facility] ENDOSCOPY;  Service: Gastroenterology;  Laterality: N/A;   ESOPHAGOGASTRODUODENOSCOPY N/A 02/12/2023   Procedure: ESOPHAGOGASTRODUODENOSCOPY (EGD);  Surgeon: Jeani Hawking, MD;  Location: [facility] ENDOSCOPY;  Service: Gastroenterology;  Laterality: N/A;   ESOPHAGOGASTRODUODENOSCOPY (EGD) WITH PROPOFOL N/A 03/13/2019   Procedure: ESOPHAGOGASTRODUODENOSCOPY (EGD) WITH PROPOFOL;  Surgeon: Beverley Fiedler, MD;  Location: [facility] ENDOSCOPY;  Service: Gastroenterology;  Laterality: N/A;   ESOPHAGOGASTRODUODENOSCOPY (EGD) WITH PROPOFOL N/A 05/27/2020   Procedure: ESOPHAGOGASTRODUODENOSCOPY (EGD) WITH PROPOFOL;  Surgeon: Lanelle Bal, DO;  [facility];  Service: Endoscopy;  Laterality: N/A;   ESOPHAGOGASTRODUODENOSCOPY (EGD) WITH PROPOFOL N/A 09/03/2022   Procedure: ESOPHAGOGASTRODUODENOSCOPY (EGD) WITH PROPOFOL;  Surgeon: Jeani Hawking, MD;  Location: [facility] ENDOSCOPY;  Service: Gastroenterology;  Laterality: N/A;   EYE SURGERY Bilateral    lazer   FLEXIBLE SIGMOIDOSCOPY N/A 05/23/2022   Procedure: FLEXIBLE SIGMOIDOSCOPY;  Surgeon: Jeani Hawking, MD;  Location: [facility] ENDOSCOPY;  Service: Gastroenterology;  Laterality: N/A;   FLEXIBLE SIGMOIDOSCOPY N/A 05/24/2022   Procedure: FLEXIBLE SIGMOIDOSCOPY;  Surgeon: Imogene Burn, MD;  Location: [facility] ENDOSCOPY;  Service: Gastroenterology;  Laterality: N/A;   FLEXIBLE SIGMOIDOSCOPY N/A 09/03/2022   Procedure: FLEXIBLE SIGMOIDOSCOPY;  Surgeon: Jeani Hawking, MD;  Location: [facility] ENDOSCOPY;  Service: Gastroenterology;  Laterality: N/A;   HEMOSTASIS CLIP PLACEMENT  03/13/2019   Procedure: HEMOSTASIS CLIP PLACEMENT;  Surgeon: Beverley Fiedler, MD;  Location: [facility] ENDOSCOPY;  Service: Gastroenterology;;   HEMOSTASIS CLIP PLACEMENT  05/23/2022   Procedure: HEMOSTASIS CLIP PLACEMENT;  Surgeon: Jeani Hawking, MD;  Location: [facility] ENDOSCOPY;  Service:  Gastroenterology;;   HEMOSTASIS CONTROL  05/24/2022   Procedure: HEMOSTASIS CONTROL;  Surgeon: Imogene Burn, MD;  Location: [facility] ENDOSCOPY;  Service: Gastroenterology;;   HOT HEMOSTASIS N/A 05/23/2022   Procedure: HOT HEMOSTASIS (ARGON PLASMA COAGULATION/BICAP);  Surgeon: Jeani Hawking, MD;  Location: [facility] ENDOSCOPY;  Service: Gastroenterology;  Laterality: N/A;   I & D EXTREMITY Left 05/05/2022   Procedure: IRRIGATION AND DEBRIDEMENT LEFT ARM AV FISTULA;  Surgeon: Cephus Shelling, MD;  Location: [facility] OR;  Service: Vascular;  Laterality: Left;   I & D EXTREMITY N/A 11/14/2022   Procedure: IRRIGATION AND DEBRIDEMENT OF LOWER EXTREMITY WOUND;  Surgeon: Nadara Mustard, MD;  Location: [facility] OR;  Service: Orthopedics;  Laterality: N/A;   INSERTION OF DIALYSIS CATHETER Right 04/02/2022   Procedure: INSERTION OF TUNNELED DIALYSIS CATHETER;  Surgeon: Nada Libman, MD;  Location: [facility] OR;  Service: Vascular;  Laterality: Right;   KNEE ARTHROSCOPY Left 10/25/2006   POLYPECTOMY  03/13/2019   Procedure: POLYPECTOMY;  Surgeon: Beverley Fiedler, MD;  Location: [facility] ENDOSCOPY;  Service: Gastroenterology;;   REMOVAL OF STONES  02/03/2022   Procedure: REMOVAL OF STONES;  Surgeon: Jeani Hawking, MD;  Location: [facility] ENDOSCOPY;  Service: Gastroenterology;;   REVISON OF ARTERIOVENOUS FISTULA Left 08/20/2022   Procedure: REVISON OF LEFT ARM ARTERIOVENOUS FISTULA;  Surgeon: Nada Libman, MD;  Location: [facility] OR;  Service: Vascular;  Laterality: Left;   RIGHT HEART CATH N/A 07/24/2017   Procedure: RIGHT HEART CATH;  Surgeon: Dolores Patty, MD;  [facility];  Service: Cardiovascular;  Laterality: N/A;   RIGHT HEART CATHETERIZATION N/A 09/22/2013   Procedure: RIGHT HEART CATH;  Surgeon: Dolores Patty, MD;  Location: [facility] CATH LAB;  Service: Cardiovascular;  Laterality: N/A;   SHOULDER ARTHROSCOPY WITH OPEN ROTATOR CUFF REPAIR Right 03/14/2014   Procedure: RIGHT SHOULDER ARTHROSCOPY WITH BICEPS RELEASE,  OPEN SUBSCAPULA REPAIR, OPEN SUPRASPINATUS REPAIR.;  Surgeon: Cammy Copa, MD;  Location: [facility] OR;  Service: Orthopedics;  Laterality: Right;   SPHINCTEROTOMY  02/03/2022   Procedure: SPHINCTEROTOMY;  Surgeon: Jeani Hawking, MD;  Location: [facility] ENDOSCOPY;  Service: Gastroenterology;;   STUMP REVISION Right 06/13/2022   Procedure: REVISION RIGHT BELOW KNEE AMPUTATION;  Surgeon: Nadara Mustard, MD;  Location: [facility] OR;  Service: Orthopedics;  Laterality: Right;   TEE WITHOUT CARDIOVERSION N/A 02/04/2022   Procedure: TRANSESOPHAGEAL ECHOCARDIOGRAM (TEE);  Surgeon: Dolores Patty, MD;  Location: [facility] ENDOSCOPY;  Service: Cardiovascular;  Laterality: N/A;   THROMBECTOMY W/ EMBOLECTOMY Left 08/20/2022   Procedure: THROMBECTOMY OF LEFT ARM ARTERIOVENOUS FISTULA;  Surgeon: Nada Libman, MD;  Location: [facility] OR;  Service: Vascular;  Laterality: Left;   TOE AMPUTATION Right 02/24/2018   GREAT TOE AND 2ND TOE AMPUTATION   TUBAL LIGATION  1970's   Patient Active Problem List   Diagnosis Date Noted   Hyperosmolar hyperglycemic state (HHS) (HCC) 02/05/2023   Depression 01/29/2023   Nausea 01/24/2023   Intractable nausea and vomiting 01/20/2023   Proctitis 01/15/2023   Acute on chronic anemia 11/25/2022   Right below-knee amputee (HCC) 11/20/2022   Cellulitis of left lower extremity 11/14/2022   Maggot infestation 11/12/2022   Complicated wound infection 11/10/2022   Secondary hypercoagulable state (HCC) 09/04/2022   Right sided abdominal pain 08/31/2022   Constipation 06/07/2022   History of Clostridioides difficile colitis 06/06/2022   Below-knee amputation of right lower extremity (HCC) 06/06/2022   Diverticulitis 06/05/2022   Stercoral colitis 06/05/2022   C. difficile colitis 06/05/2022   Spleen hematoma 06/05/2022   Dehiscence of amputation stump of right lower extremity (HCC) 06/05/2022   Rectal ulcer 05/27/2022   ESRD (end stage renal disease) (HCC) 05/27/2022   GI bleed 05/23/2022    Difficult intravenous access 05/23/2022   Gangrene of right foot (HCC) 05/02/2022   S/P BKA (below knee amputation) unilateral, right (HCC) 05/02/2022   Unspecified protein-calorie malnutrition (HCC) 04/15/2022   Secondary hyperparathyroidism of renal origin (HCC) 04/14/2022   Coagulation defect, unspecified (HCC) 04/09/2022   Acquired absence of other left toe(s) (HCC) 04/07/2022   Allergy, unspecified, initial encounter 04/07/2022   Dependence on renal dialysis (HCC) 04/07/2022   Gout due to renal impairment, unspecified site 04/07/2022   Hypertensive heart and chronic kidney disease with heart failure and with stage 5 chronic kidney disease, or end stage renal disease (HCC) 04/07/2022   Personal history of transient ischemic attack (TIA), and cerebral infarction without residual deficits 04/07/2022   Renal osteodystrophy 04/07/2022   Venous stasis ulcer of  right calf (HCC) 03/31/2022   Fistula, colovaginal 03/26/2022   Diarrhea 03/26/2022   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   Sepsis without acute organ dysfunction (HCC)    Bacteremia    Acute pancreatitis 02/01/2022   Abdominal pain 02/01/2022   SIRS (systemic inflammatory response syndrome) (HCC) 02/01/2022   Transaminitis 02/01/2022   History of anemia due to chronic kidney disease 02/01/2022   Paroxysmal atrial fibrillation (HCC) 02/01/2022   Uncontrolled type 2 diabetes mellitus with hyperglycemia, with long-term current use of insulin (HCC) 01/14/2022   NSTEMI (non-ST elevated myocardial infarction) (HCC) 03/05/2021   Acute renal failure superimposed on stage 4 chronic kidney disease (HCC) 08/22/2020   Hypoalbuminemia 05/25/2020   GERD (gastroesophageal reflux disease) 05/25/2020   Pressure injury of skin 05/17/2020   Acute on chronic combined systolic and diastolic congestive heart failure (HCC) 03/07/2020   Type 2 diabetes mellitus with diabetic polyneuropathy, with long-term current use of insulin (HCC) 03/07/2020    Obesity, Class III, BMI 40-49.9 (morbid obesity) (HCC) 03/07/2020   Common bile duct (CBD) obstruction 05/28/2019   Benign neoplasm of ascending colon    Benign neoplasm of transverse colon    Benign neoplasm of descending colon    Benign neoplasm of sigmoid colon    Gastric polyps    Hyperkalemia 03/11/2019   Prolonged QT interval 03/11/2019   Acute blood loss anemia 03/11/2019   Onychomycosis 06/21/2018   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)    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 (HCC)    PVD (peripheral vascular disease) (HCC) 10/26/2017   E-coli UTI 07/27/2017   Hypothyroidism 07/27/2017   AKI (acute kidney injury) (HCC)    PAH (pulmonary artery hypertension) (HCC)    Impaired ambulation 07/19/2017   Nausea & vomiting 07/15/2017   Leg cramps 02/27/2017   Peripheral edema 01/12/2017   Diabetic neuropathy (HCC) 11/12/2016   CKD (chronic kidney disease), stage IV (HCC) 10/24/2015   Anemia of chronic disease 10/03/2015   Historical diagnosis of generalized anxiety disorder 10/03/2015   Secondary insomnia 10/03/2015   Hyperglycemia due to diabetes mellitus (HCC) 06/07/2015   Non compliance with medical treatment 04/17/2014   Rotator cuff tear 03/14/2014   Obesity 09/23/2013   Chronic HFrEF (heart failure with reduced ejection fraction) (HCC) 06/03/2013   Hypotension 12/25/2012   Hyponatremia 12/25/2012   Urinary incontinence    MDD (major depressive disorder), recurrent episode, moderate (HCC) 11/12/2010   RBBB (right bundle branch block)    Wide-complex tachycardia    Coronary artery disease    Hyperlipemia 01/22/2009   Chronic hypotension 01/22/2009    ONSET DATE: 03/09/2023 MD referral to PT  REFERRING DIAG: S88.111D (ICD-10-CM) - Below-knee amputation of right lower extremity, subsequent encounter   THERAPY DIAG:  Unsteadiness on feet  Muscle weakness (generalized)  Phantom limb syndrome with pain (HCC)  Other abnormalities of  gait and mobility  Impaired functional mobility, balance, gait, and endurance  Rationale for Evaluation and Treatment: Rehabilitation  SUBJECTIVE:   SUBJECTIVE STATEMENT: She wore prosthesis every day except one over the last week for most of awake hours on non-dialysis days and ~2 hours after dialysis.   PERTINENT HISTORY: Right TTA 05/02/22, Lymphedema, idiopathic chronic venous HTN with ulcer, depression, colitis, diverticulitis, ESRD, gout, NSTEMI, DM2, polyneuropathy, PVD, CAD, right bundle branch block, mini strokes  PAIN:  Are you having pain? Phantom pain with no known aggravation.   PRECAUTIONS: Fall and Other: No BP LUE  WEIGHT BEARING RESTRICTIONS: No  FALLS:  Has patient fallen in last 6 months? No  LIVING ENVIRONMENT: Lives with: lives with their spouse and 2 small dogs Lives in: mobile home Home Access: Ramped entrance Home layout: One level Stairs: Yes: External: 6-7 steps; can reach both Has following equipment at home: Single point cane, Environmental consultant - 2 wheeled, Environmental consultant - 4 wheeled, Wheelchair (manual), [employer], Grab bars, and Ramped entry  OCCUPATION:  retired  PLOF: prior to amputation for ~year used RW in home & community  PATIENT GOALS:  to use prosthesis to walk, get out w/c in & out of car, get to bathroom.   OBJECTIVE:  COGNITION: Overall cognitive status:  Eval on 03/23/2023:   Within functional limits for tasks assessed  POSTURE:  Eval on 03/23/2023:  rounded shoulders, forward head, increased thoracic kyphosis, flexed trunk , and weight shift left  LOWER EXTREMITY ROM: ROM Right eval Left eval  Hip flexion    Hip extension    Hip abduction    Hip adduction    Hip internal rotation    Hip external rotation    Knee flexion    Knee extension    Ankle dorsiflexion    Ankle plantarflexion    Ankle inversion    Ankle eversion     (Blank rows = not tested)  LOWER EXTREMITY MMT:  MMT Right eval Left eval  Hip flexion 3-/5 3-/5  Hip  extension 2/5 2/5  Hip abduction 2+/5 2+/5  Hip adduction    Hip internal rotation    Hip external rotation    Knee flexion 3-/5 3-/5  Knee extension 3-/5 3-/5  Ankle dorsiflexion    Ankle plantarflexion    Ankle inversion    Ankle eversion    At Evaluation all strength testing is grossly seated and functionally standing / gait. (Blank rows = not tested)  TRANSFERS:  Eval on 03/23/2023:  Sit to stand: Max A (two-person assist) from 22" w/c to rolling walker Stand to sit: Max A (two-person assist) rolling walker to wheelchair  FUNCTIONAL TESTs:   Eval on 03/23/2023: Patient maintained upright holding rolling walker with mod assist 2 people for safety for 30 seconds.  GAIT: Eval on 03/23/2023:  Patient took 2 steps (one per LE) with +2 max assist with rolling walker and TTA prosthesis.  Patient adducting prosthesis stepping on left foot with no awareness.  CURRENT PROSTHETIC WEAR ASSESSMENT:  Eval on 03/23/2023:   Patient is dependent with: skin check, residual limb care, prosthetic cleaning, ply sock cleaning, correct ply sock adjustment, proper wear schedule/adjustment, and proper weight-bearing schedule/adjustment Donning prosthesis: Max A Doffing prosthesis: Min A Prosthetic wear tolerance: 2-6 hours, 1x/day, 3-4 days/week Prosthetic weight bearing tolerance: 3 minutes with limb pain Edema: pitting edema Residual limb condition: No open areas, dry skin, normal color and temperature, cylindrical shape Prosthetic description: Silicone liner with pin lock suspension, total contact socket with flexible inner socket, sach foot    TODAY'S TREATMENT:  DATE:  03/30/2023: Prosthetic Training with Transtibial Amputation: PT cued on wear most of awake hours including after dialysis if out of bed. PT demo & verbal cues on patella centered in patella relief area.   Pt & husband verbalized understanding.    Therapeutic Activities: PT demo & verbal cues on sit to/from stand technique. Sit to stand from 21" w/c to sink with RUE on sink & LUE on w/c with maxA.  Stand to sit sink to w/c reaching LUE to w/c with modA. Standing at sink with pelvic weight shifts right/midline/left with tactile & verbal cues on proprioception with socket / limb interface.  Pt sat on 24" bar stool without UE support with both feet on floor with 3 min with supervision. Sit to stand BUE on sink to bar stool with modA. PT cued on rationale to build trunk stability & endurance in upright position.   Scooting transfer w/c <> Nustep with modA with cues on technique.   Therapeutic  Exercise: Nustep seat 9 level 5 with BLEs & BUEs 3 min 2 sets with timed 2 min rest bw sets. LLE with Theraband sling.  Pt reports she could tell it would help her legs & arms to get stronger.     03/23/2023: Prosthetic Training with Transtibial Amputation: See patient education noted below  PATIENT EDUCATION: PATIENT EDUCATED ON FOLLOWING PROSTHETIC CARE: Education details: Use of stockinette under proximal liner to decrease itching sensation, no wounds presents to PT recommended not using Vive wear under liner Skin check, Prosthetic cleaning, Propper donning, and Proper wear schedule/adjustment Prosthetic wear tolerance: 3 hours 2x/day, 4 days/week Person educated: Patient and Spouse Education method: Explanation, Tactile cues, and Verbal cues Education comprehension: verbalized understanding, verbal cues required, tactile cues required, and needs further education  HOME EXERCISE PROGRAM:  ASSESSMENT:  CLINICAL IMPRESSION: Patient tolerated modified standing at sink for short (<2 min) periods.  She had core strength to tolerate sitting on 24" bar stool for 3 min without UE assist.  Pt benefited from recumbent stepper for strength & endurance training.  Pt continues to benefit from skilled PT.    OBJECTIVE IMPAIRMENTS: Abnormal gait, decreased activity tolerance, decreased balance, decreased endurance, decreased knowledge of condition, decreased knowledge of use of DME, decreased mobility, difficulty walking, decreased ROM, decreased strength, decreased safety awareness, postural dysfunction, prosthetic dependency , and pain.   ACTIVITY LIMITATIONS: standing, transfers, and locomotion level  PARTICIPATION LIMITATIONS: community activity, household mobility and dependency / burden of care on family  PERSONAL FACTORS: Age, Fitness, Past/current experiences, Time since onset of injury/illness/exacerbation, and 3+ comorbidities: see PMH  are also affecting patient's functional outcome.   REHAB POTENTIAL: Good  CLINICAL DECISION MAKING: Evolving/moderate complexity  EVALUATION COMPLEXITY: Moderate   GOALS: Goals reviewed with patient? Yes  SHORT TERM GOALS: Target date: 04/22/2023:  Patient donnes prosthesis correctly with husband's assist & verbalizes proper cleaning. Baseline: SEE OBJECTIVE DATA Goal status: Ongoing 03/30/2023 2.  Patient tolerates prosthesis wear on non-dailysis days >8 hrs total /day and on dialysis days >2 hours/day without skin issues or limb pain >5/10. Baseline: SEE OBJECTIVE DATA Goal status: Ongoing 03/30/2023  3.  Patient able to stand with rolling walker with 1 person modA for 1 min.  Baseline: SEE OBJECTIVE DATA Goal status: Ongoing 03/30/2023  4. Patient ambulates 5' with RW & prosthesis with +2 maxA.  Baseline: SEE OBJECTIVE DATA Goal status: Ongoing 03/30/2023  5. Patient sit to/from stand transfer with +1 maxA and scooting transfer with modA. Baseline: SEE OBJECTIVE DATA  Goal status: Ongoing 03/30/2023  LONG TERM GOALS: Target date: 06/17/2022  Patient & husband demonstrates & verbalizes understanding of prosthetic care to enable safe utilization of prosthesis. Baseline: SEE OBJECTIVE DATA Goal status: Ongoing 03/30/2023  Patient  tolerates prosthesis wear >80% of awake hours on non-dialysis days and >50% on dialysis days without skin issues and limb pain </= 4/10. Baseline: SEE OBJECTIVE DATA Goal status: Ongoing 03/30/2023  Scooting transfers with minA and sit to/from stand transfers with modA.  Baseline: SEE OBJECTIVE DATA Goal status: Ongoing 03/30/2023  Patient ambulates >10' with prosthesis & RW with modA.  Baseline: SEE OBJECTIVE DATA Goal status: Ongoing 03/30/2023  Patient stands with RW support with minA or less for 2 min.  Baseline: SEE OBJECTIVE DATA Goal status:   Ongoing 03/30/2023   PLAN:  PT FREQUENCY: 2x/week  PT DURATION: 12 weeks  PLANNED INTERVENTIONS: 97164- PT Re-evaluation, 97110-Therapeutic exercises, 97530- Therapeutic activity, 97112- Neuromuscular re-education, [phone]- Self Care, [id number]- Gait training, [phone]- Prosthetic training, Patient/Family education, Balance training, DME instructions, Therapeutic exercises, Therapeutic activity, Neuromuscular re-education, Gait training, and Self Care  PLAN FOR NEXT SESSION: set up HEP seated & supine for strength, work on sit/stand at sink. Nustep.    Vladimir Faster, PT, DPT 03/30/2023, 10:33 AM

## 2023-03-31 DIAGNOSIS — Z992 Dependence on renal dialysis: Secondary | ICD-10-CM | POA: Diagnosis not present

## 2023-03-31 DIAGNOSIS — N186 End stage renal disease: Secondary | ICD-10-CM | POA: Diagnosis not present

## 2023-03-31 DIAGNOSIS — N2581 Secondary hyperparathyroidism of renal origin: Secondary | ICD-10-CM | POA: Diagnosis not present

## 2023-03-31 DIAGNOSIS — D631 Anemia in chronic kidney disease: Secondary | ICD-10-CM | POA: Diagnosis not present

## 2023-03-31 DIAGNOSIS — D689 Coagulation defect, unspecified: Secondary | ICD-10-CM | POA: Diagnosis not present

## 2023-04-01 ENCOUNTER — Ambulatory Visit (HOSPITAL_COMMUNITY)
Admission: RE | Admit: 2023-04-01 | Discharge: 2023-04-01 | Disposition: A | Discharge status: Home / Self Care | Payer: PPO | Source: Ambulatory Visit | Attending: Vascular Surgery | Admitting: Vascular Surgery

## 2023-04-01 ENCOUNTER — Ambulatory Visit (INDEPENDENT_AMBULATORY_CARE_PROVIDER_SITE_OTHER)
Admission: RE | Admit: 2023-04-01 | Discharge: 2023-04-01 | Disposition: A | Discharge status: Home / Self Care | Payer: PPO | Source: Ambulatory Visit | Attending: Vascular Surgery | Admitting: Vascular Surgery

## 2023-04-01 ENCOUNTER — Ambulatory Visit: Payer: PPO | Admitting: Vascular Surgery

## 2023-04-01 ENCOUNTER — Encounter: Payer: Self-pay | Admitting: Physical Therapy

## 2023-04-01 ENCOUNTER — Ambulatory Visit: Payer: PPO | Admitting: Physical Therapy

## 2023-04-01 VITALS — BP 150/73 | HR 69 | Temp 98.0°F

## 2023-04-01 DIAGNOSIS — N186 End stage renal disease: Secondary | ICD-10-CM

## 2023-04-01 DIAGNOSIS — Z992 Dependence on renal dialysis: Secondary | ICD-10-CM | POA: Insufficient documentation

## 2023-04-01 DIAGNOSIS — G546 Phantom limb syndrome with pain: Secondary | ICD-10-CM

## 2023-04-01 DIAGNOSIS — R2689 Other abnormalities of gait and mobility: Secondary | ICD-10-CM

## 2023-04-01 DIAGNOSIS — R2681 Unsteadiness on feet: Secondary | ICD-10-CM

## 2023-04-01 DIAGNOSIS — Z7409 Other reduced mobility: Secondary | ICD-10-CM | POA: Diagnosis not present

## 2023-04-01 DIAGNOSIS — M6281 Muscle weakness (generalized): Secondary | ICD-10-CM | POA: Diagnosis not present

## 2023-04-01 NOTE — H&P (View-Only) (Signed)
 [facility] [place]  ASSESSMENT / PLAN: Susan [name] is a 72 y.o. right handed female in need of permanent dialysis access. I reviewed options for dialysis in detail with the patient, including hemodialysis and peritoneal dialysis. I counseled the patient to ask their nephrologist about their candidacy for renal transplant. I counseled the patient that dialysis access requires surveillance and periodic maintenance. Plan to proceed with right first stage brachiobasilic arteriovenous fistula.   CHIEF COMPLAINT: ESRD  HISTORY OF PRESENT ILLNESS: Susan [name] is a 72 y.o. female with ESRD on HD via TDC. The catheter is working well. She previously had a left arm basilic vein fistula which has failed. We reviewed her vein mapping in detail.   Past Medical History:  Diagnosis Date   Anemia    hx   Anxiety    Arthritis    "generalized" (03/15/2014)   CAD (coronary artery disease)    MI in 2000 - MI  2007 - treated bare metal stent (no nuclear since then as 9/11)   Carotid artery disease (HCC)    Chronic diastolic heart failure (HCC)    a) ECHO (08/2013) EF 55-60% and RV function nl b) RHC (08/2013) RA 4, RV 30/5/7, PA 25/10 (16), PCWP 7, Fick CO/CI 6.3/2.7, PVR 1.5 WU, PA 61 and 66%   Daily headache    "~ every other day; since I fell in June" (03/15/2014)   Depression    Diabetic retinopathy (HCC)    Dyslipidemia    ESRD (end stage renal disease) (HCC)    Dialysis on Tues Thurs Sat   Exertional shortness of breath    History of kidney stones    HTN (hypertension)    Hypothyroidism    Obesity    Osteoarthritis    PAF (paroxysmal atrial fibrillation) (HCC)    Peripheral neuropathy    bilateral feet/hands   PONV (postoperative nausea and vomiting)    RBBB (right bundle branch block)    Old   Stroke (HCC)    mini strokes   Type II diabetes mellitus (HCC)    Type II, [name]. patient has omnipod insulin pump with Novolin R Insulin     Past Surgical History:  Procedure Laterality Date   A/V FISTULAGRAM Left 11/07/2022   Procedure: A/V Fistulagram;  Surgeon: Leonie Douglas, MD;  [facility];  Service: Cardiovascular;  Laterality: Left;   ABDOMINAL HYSTERECTOMY  1980's   AMPUTATION Right 02/24/2018   Procedure: RIGHT FOOT GREAT TOE AND 2ND TOE AMPUTATION;  Surgeon: Nadara Mustard, MD;  Location: [facility] OR;  Service: Orthopedics;  Laterality: Right;   AMPUTATION Right 04/30/2018   Procedure: RIGHT TRANSMETATARSAL AMPUTATION;  Surgeon: Nadara Mustard, MD;  Location: [facility] OR;  Service: Orthopedics;  Laterality: Right;   AMPUTATION Right 05/02/2022   Procedure: RIGHT BELOW KNEE AMPUTATION;  Surgeon: Nadara Mustard, MD;  Location[facility] OR;  Service: Orthopedics;  Laterality: Right;   APPLICATION OF WOUND VAC Right 06/13/2022   Procedure: APPLICATION OF WOUND VAC;  Surgeon: Nadara Mustard, MD;  Location: [facility] OR;  Service: Orthopedics;  Laterality: Right;   APPLICATION OF WOUND VAC Left 11/14/2022   Procedure: APPLICATION OF WOUND VAC;  Surgeon: Nadara Mustard, MD;  [facility]: [facility];  Service: Orthopedics;  Laterality: Left;   AV FISTULA PLACEMENT Left 04/02/2022   Procedure: LEFT ARM ARTERIOVENOUS (AV) FISTULA CREATION;  Surgeon: Nada Libman, MD;  [facility]: [facility];  Service: Vascular;  Laterality: Left;  PERIPHERAL NERVE BLOCK   BASCILIC VEIN TRANSPOSITION Left 07/31/2022   Procedure: LEFT ARM SECOND STAGE BASILIC VEIN TRANSPOSITION;  Surgeon: Nada Libman, MD;  [facility]: [facility];  Service: Vascular;  Laterality: Left;   BIOPSY  05/27/2020   Procedure: BIOPSY;  Surgeon: Lanelle Bal, DO;  [facility]: [facility];  Service: Endoscopy;;   CATARACT EXTRACTION, BILATERAL Bilateral ?2013   COLONOSCOPY W/ POLYPECTOMY     COLONOSCOPY WITH PROPOFOL N/A 03/13/2019   Procedure: COLONOSCOPY WITH PROPOFOL;  Surgeon: Beverley Fiedler, MD;  Location: [facility] ENDOSCOPY;  Service: Gastroenterology;  Laterality: N/A;   CORONARY ANGIOPLASTY  WITH STENT PLACEMENT  1999; 2007   "1 + 1"   ERCP N/A 02/03/2022   Procedure: ENDOSCOPIC RETROGRADE CHOLANGIOPANCREATOGRAPHY (ERCP);  Surgeon: Jeani Hawking, MD;  Location: [facility] ENDOSCOPY;  Service: Gastroenterology;  Laterality: N/A;   ESOPHAGOGASTRODUODENOSCOPY N/A 02/12/2023   Procedure: ESOPHAGOGASTRODUODENOSCOPY (EGD);  Surgeon: Jeani Hawking, MD;  Location: [facility] ENDOSCOPY;  Service: Gastroenterology;  Laterality: N/A;   ESOPHAGOGASTRODUODENOSCOPY (EGD) WITH PROPOFOL N/A 03/13/2019   Procedure: ESOPHAGOGASTRODUODENOSCOPY (EGD) WITH PROPOFOL;  Surgeon: Beverley Fiedler, MD;  Location: [facility] ENDOSCOPY;  Service: Gastroenterology;  Laterality: N/A;   ESOPHAGOGASTRODUODENOSCOPY (EGD) WITH PROPOFOL N/A 05/27/2020   Procedure: ESOPHAGOGASTRODUODENOSCOPY (EGD) WITH PROPOFOL;  Surgeon: Lanelle Bal, DO;  [facility];  Service: Endoscopy;  Laterality: N/A;   ESOPHAGOGASTRODUODENOSCOPY (EGD) WITH PROPOFOL N/A 09/03/2022   Procedure: ESOPHAGOGASTRODUODENOSCOPY (EGD) WITH PROPOFOL;  Surgeon: Jeani Hawking, MD;  Location: [facility] ENDOSCOPY;  Service: Gastroenterology;  Laterality: N/A;   EYE SURGERY Bilateral    lazer   FLEXIBLE SIGMOIDOSCOPY N/A 05/23/2022   Procedure: FLEXIBLE SIGMOIDOSCOPY;  Surgeon: Jeani Hawking, MD;  Location: [facility] ENDOSCOPY;  Service: Gastroenterology;  Laterality: N/A;   FLEXIBLE SIGMOIDOSCOPY N/A 05/24/2022   Procedure: FLEXIBLE SIGMOIDOSCOPY;  Surgeon: Imogene Burn, MD;  Location: [facility] ENDOSCOPY;  Service: Gastroenterology;  Laterality: N/A;   FLEXIBLE SIGMOIDOSCOPY N/A 09/03/2022   Procedure: FLEXIBLE SIGMOIDOSCOPY;  Surgeon: Jeani Hawking, MD;  Location: [facility] ENDOSCOPY;  Service: Gastroenterology;  Laterality: N/A;   HEMOSTASIS CLIP PLACEMENT  03/13/2019   Procedure: HEMOSTASIS CLIP PLACEMENT;  Surgeon: Beverley Fiedler, MD;  Location: [facility] ENDOSCOPY;  Service: Gastroenterology;;   HEMOSTASIS CLIP PLACEMENT  05/23/2022   Procedure: HEMOSTASIS CLIP PLACEMENT;  Surgeon: Jeani Hawking,  MD;  Location: [facility] ENDOSCOPY;  Service: Gastroenterology;;   HEMOSTASIS CONTROL  05/24/2022   Procedure: HEMOSTASIS CONTROL;  Surgeon: Imogene Burn, MD;  Location: [facility] ENDOSCOPY;  Service: Gastroenterology;;   HOT HEMOSTASIS N/A 05/23/2022   Procedure: HOT HEMOSTASIS (ARGON PLASMA COAGULATION/BICAP);  Surgeon: Jeani Hawking, MD;  Location: [facility] ENDOSCOPY;  Service: Gastroenterology;  Laterality: N/A;   I & D EXTREMITY Left 05/05/2022   Procedure: IRRIGATION AND DEBRIDEMENT LEFT ARM AV FISTULA;  Surgeon: Cephus Shelling, MD;  Location: [facility] OR;  Service: Vascular;  Laterality: Left;   I & D EXTREMITY N/A 11/14/2022   Procedure: IRRIGATION AND DEBRIDEMENT OF LOWER EXTREMITY WOUND;  Surgeon: Nadara Mustard, MD;  [facility];  Service: Orthopedics;  Laterality: N/A;   INSERTION OF DIALYSIS CATHETER Right 04/02/2022   Procedure: INSERTION OF TUNNELED DIALYSIS CATHETER;  Surgeon: Nada Libman, MD;  Location: [facility] OR;  Service: Vascular;  Laterality: Right;   KNEE ARTHROSCOPY Left 10/25/2006   POLYPECTOMY  03/13/2019   Procedure: POLYPECTOMY;  Surgeon: Beverley Fiedler, MD;  Location: [facility] ENDOSCOPY;  Service: Gastroenterology;;   REMOVAL OF STONES  02/03/2022   Procedure: REMOVAL OF STONES;  Surgeon: Jeani Hawking, MD;  Location: [facility] ENDOSCOPY;  Service: Gastroenterology;;   REVISON OF ARTERIOVENOUS FISTULA Left 08/20/2022   Procedure: REVISON OF LEFT ARM ARTERIOVENOUS FISTULA;  Surgeon: Nada Libman, MD;  Location: [facility] OR;  Service: Vascular;  Laterality: Left;   RIGHT HEART CATH N/A 07/24/2017   Procedure: RIGHT HEART CATH;  Surgeon: Dolores Patty, MD;  [facility];  Service: Cardiovascular;  Laterality: N/A;   RIGHT HEART CATHETERIZATION N/A 09/22/2013   Procedure: RIGHT HEART CATH;  Surgeon: Dolores Patty, MD;  Location: [facility] CATH LAB;  Service: Cardiovascular;  Laterality: N/A;   SHOULDER ARTHROSCOPY WITH OPEN ROTATOR CUFF REPAIR Right 03/14/2014   Procedure: RIGHT  SHOULDER ARTHROSCOPY WITH BICEPS RELEASE, OPEN SUBSCAPULA REPAIR, OPEN SUPRASPINATUS REPAIR.;  Surgeon: Cammy Copa, MD;  Location: [facility] OR;  Service: Orthopedics;  Laterality: Right;   SPHINCTEROTOMY  02/03/2022   Procedure: SPHINCTEROTOMY;  Surgeon: Jeani Hawking, MD;  Location: [facility] ENDOSCOPY;  Service: Gastroenterology;;   STUMP REVISION Right 06/13/2022   Procedure: REVISION RIGHT BELOW KNEE AMPUTATION;  Surgeon: Nadara Mustard, MD;  Location: [facility] OR;  Service: Orthopedics;  Laterality: Right;   TEE WITHOUT CARDIOVERSION N/A 02/04/2022   Procedure: TRANSESOPHAGEAL ECHOCARDIOGRAM (TEE);  Surgeon: Dolores Patty, MD;  Location: [facility] ENDOSCOPY;  Service: Cardiovascular;  Laterality: N/A;   THROMBECTOMY W/ EMBOLECTOMY Left 08/20/2022   Procedure: THROMBECTOMY OF LEFT ARM ARTERIOVENOUS FISTULA;  Surgeon: Nada Libman, MD;  [facility];  Service: Vascular;  Laterality: Left;   TOE AMPUTATION Right 02/24/2018   GREAT TOE AND 2ND TOE AMPUTATION   TUBAL LIGATION  1970's    Family History  Problem Relation Age of Onset   Heart attack Mother 33    Social History   Socioeconomic History   Marital status: Married    Spouse name: Ramon Dredge   Number of children: 3   Years of education: 12th   Highest education level: Not on file  Occupational History    Employer: UNEMPLOYED  Tobacco Use   Smoking status: Former    Current packs/day: 0.00    Average packs/day: 3.0 packs/day for 32.0 years (96.0 ttl pk-yrs)    Types: Cigarettes    Start date: 10/24/1965    Quit date: 10/24/1997    Years since quitting: 25.4   Smokeless tobacco: Never  Vaping Use   Vaping status: Never Used  Substance and Sexual Activity   Alcohol use: Not Currently    Comment: "might have 2-3 daiquiris in the summer"   Drug use: No   Sexual activity: Not Currently    Birth control/protection: Surgical    Comment: Hysterectomy  Other Topics Concern   Not on file  Social History Narrative   Pt lives at home with  her spouse.Caffeine Use- 3 sodas daily.  Update:  now resides [facility]   Social Determinants of Health   Financial Resource Strain: Low Risk  (08/16/2021)   Overall Financial Resource Strain (CARDIA)    Difficulty of Paying Living Expenses: Not hard at all  Food Insecurity: No Food Insecurity (02/11/2023)   Hunger Vital Sign    Worried About Running Out of Food in the Last Year: Never true    Ran Out of Food in the Last Year: Never true  Transportation Needs: No Transportation Needs (02/11/2023)   PRAPARE - Administrator, Civil Service (Medical): No    Lack of Transportation (Non-Medical): No  Physical Activity: Inactive (08/16/2021)   Exercise Vital  Sign    Days of Exercise per Week: 0 days    Minutes of Exercise per Session: 0 min  Stress: No Stress Concern Present (08/16/2021)   [employer] of Occupational Health - Occupational Stress Questionnaire    Feeling of Stress : Not at all  Social Connections: Moderately Isolated (08/16/2021)   Social Connection and Isolation Panel [NHANES]    Frequency of Communication with Friends and Family: More than three times a week    Frequency of Social Gatherings with Friends and Family: More than three times a week    Attends Religious Services: Never    Database administrator or Organizations: No    Attends Banker Meetings: Never    Marital Status: Married  Catering manager Violence: Not At Risk (02/11/2023)   Humiliation, Afraid, Rape, and Kick questionnaire    Fear of Current or Ex-Partner: No    Emotionally Abused: No    Physically Abused: No    Sexually Abused: No    Allergies  Allergen Reactions   Cephalexin Diarrhea and Other (See Comments)   Codeine Nausea And Vomiting and Other (See Comments)    Current Outpatient Medications  Medication Sig Dispense Refill   acetaminophen (TYLENOL) 325 MG tablet Take 2 tablets (650 mg total) by mouth every 6 (six) hours as needed for mild pain (or Fever >/=  101).     amiodarone (PACERONE) 200 MG tablet Take 1 tablet (200 mg total) by mouth daily. 30 tablet 0   apixaban (ELIQUIS) 5 MG TABS tablet Take 1 tablet (5 mg total) by mouth 2 (two) times daily. 60 tablet 5   ascorbic acid (VITAMIN C) 500 MG tablet Take 1 tablet (500 mg total) by mouth daily. 100 tablet 0   atorvastatin (LIPITOR) 80 MG tablet TAKE 1 TABLET BY MOUTH EVERY DAY 90 tablet 0   carvedilol (COREG) 6.25 MG tablet Take 6.25 mg by mouth 2 (two) times daily with a meal. Take on Mon/Wed/Fri -non Dialysis days     clonazePAM (KLONOPIN) 0.5 MG disintegrating tablet Take 1 tablet (0.5 mg total) by mouth 2 (two) times daily. 60 tablet 1   folic acid (FOLVITE) 1 MG tablet Take 1 tablet (1 mg total) by mouth daily. 30 tablet 1   insulin aspart (NOVOLOG) 100 UNIT/ML injection Inject 0-15 Units into the skin 3 (three) times daily with meals. CBG < 70: treat low blood sugar, CBG 70 - 120: 0 units, CBG 121 - 150: 2 units, CBG 151 - 200: 3 units, CBG 201 - 250: 5 units, CBG 251 - 300: 8 units, CBG 301 - 350: 11 units, CBG 351 - 400: 15 units, CBG > 400: call MD 60 mL 5   insulin glargine (LANTUS) 100 UNIT/ML Solostar Pen Inject 26 Units into the skin daily. (Patient taking differently: Inject 31 Units into the skin daily.)     levothyroxine (SYNTHROID) 75 MCG tablet Take 1 tablet (75 mcg total) by mouth daily before breakfast. 30 tablet 0   LORazepam (ATIVAN) 0.5 MG tablet Take 1 tablet (0.5 mg total) by mouth at bedtime. 30 tablet 2   methocarbamol (ROBAXIN) 750 MG tablet Take 1 tablet (750 mg total) by mouth every 6 (six) hours as needed for muscle spasms. 60 tablet 0   Methoxy PEG-Epoetin Beta (MIRCERA IJ) Mircera     nitroGLYCERIN (NITROSTAT) 0.4 MG SL tablet Place 1 tablet (0.4 mg total) under the tongue every 5 (five) minutes as needed. 25 tablet 3   pantoprazole (  PROTONIX) 40 MG tablet Take 1 tablet (40 mg total) by mouth 2 (two) times daily before a meal. 180 tablet 1   polyethylene glycol  (MIRALAX / GLYCOLAX) 17 g packet Take 17 g by mouth daily. (Patient taking differently: Take 17 g by mouth See admin instructions. Every other day)     prochlorperazine (COMPAZINE) 5 MG tablet Take 1 tablet (5 mg total) by mouth every 8 (eight) hours as needed for nausea or vomiting. 60 tablet 0   venlafaxine XR (EFFEXOR-XR) 75 MG 24 hr capsule Take 1 capsule (75 mg total) by mouth daily with breakfast. Stop trintellix 30 capsule 3   Zinc Sulfate 220 (50 Zn) MG TABS Take 1 tablet (220 mg total) by mouth daily. 100 tablet 0   No current facility-administered medications for this visit.    PHYSICAL EXAM Vitals:   04/01/23 1434  BP: (!) 150/73  Pulse: 69  Temp: 98 F (36.7 C)  TempSrc: Temporal  SpO2: 99%   Chronically ill in no distress Palpable radial pulses Scar in left arm consistent with brachiobasilic fistula  PERTINENT LABORATORY AND RADIOLOGIC DATA  Most recent CBC    Latest Ref Rng & Units 02/17/2023    9:17 AM 02/15/2023   10:43 AM 02/14/2023    7:02 AM  CBC  WBC 4.0 - 10.5 K/uL 5.2  6.7  7.1   Hemoglobin 12.0 - 15.0 g/dL 13.2  44.0  9.7   Hematocrit 36.0 - 46.0 % 33.4  35.7  33.0   Platelets 150 - 400 K/uL 186  196  191      Most recent CMP    Latest Ref Rng & Units 02/17/2023    9:17 AM 02/15/2023   10:43 AM 02/14/2023    2:46 PM  CMP  Glucose 70 - 99 mg/dL 102  725  366   BUN 8 - 23 mg/dL 35  16  9   Creatinine 0.44 - 1.00 mg/dL 4.40  3.47  4.25   Sodium 135 - 145 mmol/L 127  131  134   Potassium 3.5 - 5.1 mmol/L 3.8  3.5  3.4   Chloride 98 - 111 mmol/L 94  98  98   CO2 22 - 32 mmol/L 25  25  27    Calcium 8.9 - 10.3 mg/dL 8.8  8.9  8.6   Total Protein 6.5 - 8.1 g/dL   6.2   Total Bilirubin 0.3 - 1.2 mg/dL   0.7   Alkaline Phos 38 - 126 U/L   152   AST 15 - 41 U/L   19   ALT 0 - 44 U/L   18     Renal function CrCl cannot be calculated (Patient's most recent lab result is older than the maximum 21 days allowed.).  Hgb A1c MFr Bld (% of total Hgb)  Date  Value  02/27/2023 9.0 (H)    LDL Cholesterol (Calc)  Date Value Ref Range Status  01/17/2019 65 mg/dL (calc) Final    Comment:    Reference range: <100 . Desirable range <100 mg/dL for primary prevention;   <70 mg/dL for patients with CHD or diabetic patients  with > or = 2 CHD risk factors. Marland Kitchen LDL-C is now calculated using the Martin-Hopkins  calculation, which is a validated novel method providing  better accuracy than the Friedewald equation in the  estimation of LDL-C.  Horald Pollen et al. Lenox Ahr. 9563;875(64): 2061-2068  (http://education.[facility].com/faq/FAQ164)    LDL Cholesterol  Date Value Ref Range Status  02/01/2022 29 0 - 99 mg/dL Final    Comment:           Total Cholesterol/HDL:CHD Risk Coronary Heart Disease Risk Table                     Men   Women  1/2 Average Risk   3.4   3.3  Average Risk       5.0   4.4  2 X Average Risk   9.6   7.1  3 X Average Risk  23.4   11.0        Use the calculated Patient Ratio above and the CHD Risk Table to determine the patient's CHD Risk.        ATP III CLASSIFICATION (LDL):  <100     mg/dL   Optimal  161-096  mg/dL   Near or Above                    Optimal  130-159  mg/dL   Borderline  045-409  mg/dL   High  >811     mg/dL   Very High Performed at [facility], [address] [place], [place] [address]     Right basilic vein looks good.  Rande Brunt. Lenell Antu, MD [facility] Phone Number: [phone] 04/01/2023 4:50 PM   Total time spent on preparing this encounter including chart review, data review, collecting history, examining the patient, coordinating care for this established patient, 30 minutes.  Portions of this report may have been transcribed using voice recognition software.  Every effort has been made to ensure accuracy; however, inadvertent computerized transcription errors may still be present.

## 2023-04-01 NOTE — Progress Notes (Signed)
[facility] [place]  ASSESSMENT / PLAN: Susan [name] is a 72 y.o. right handed female in need of permanent dialysis access. I reviewed options for dialysis in detail with the patient, including hemodialysis and peritoneal dialysis. I counseled the patient to ask their nephrologist about their candidacy for renal transplant. I counseled the patient that dialysis access requires surveillance and periodic maintenance. Plan to proceed with right first stage brachiobasilic arteriovenous fistula.   CHIEF COMPLAINT: ESRD  HISTORY OF PRESENT ILLNESS: Susan [name] is a 72 y.o. female with ESRD on HD via TDC. The catheter is working well. She previously had a left arm basilic vein fistula which has failed. We reviewed her vein mapping in detail.   Past Medical History:  Diagnosis Date   Anemia    hx   Anxiety    Arthritis    "generalized" (03/15/2014)   CAD (coronary artery disease)    MI in 2000 - MI  2007 - treated bare metal stent (no nuclear since then as 9/11)   Carotid artery disease (HCC)    Chronic diastolic heart failure (HCC)    a) ECHO (08/2013) EF 55-60% and RV function nl b) RHC (08/2013) RA 4, RV 30/5/7, PA 25/10 (16), PCWP 7, Fick CO/CI 6.3/2.7, PVR 1.5 WU, PA 61 and 66%   Daily headache    "~ every other day; since I fell in June" (03/15/2014)   Depression    Diabetic retinopathy (HCC)    Dyslipidemia    ESRD (end stage renal disease) (HCC)    Dialysis on Tues Thurs Sat   Exertional shortness of breath    History of kidney stones    HTN (hypertension)    Hypothyroidism    Obesity    Osteoarthritis    PAF (paroxysmal atrial fibrillation) (HCC)    Peripheral neuropathy    bilateral feet/hands   PONV (postoperative nausea and vomiting)    RBBB (right bundle branch block)    Old   Stroke (HCC)    mini strokes   Type II diabetes mellitus (HCC)    Type II, [name]. patient has omnipod insulin pump with Novolin R Insulin     Past Surgical History:  Procedure Laterality Date   A/V FISTULAGRAM Left 11/07/2022   Procedure: A/V Fistulagram;  Surgeon: Leonie Douglas, MD;  [facility];  Service: Cardiovascular;  Laterality: Left;   ABDOMINAL HYSTERECTOMY  1980's   AMPUTATION Right 02/24/2018   Procedure: RIGHT FOOT GREAT TOE AND 2ND TOE AMPUTATION;  Surgeon: Nadara Mustard, MD;  Location: [facility] OR;  Service: Orthopedics;  Laterality: Right;   AMPUTATION Right 04/30/2018   Procedure: RIGHT TRANSMETATARSAL AMPUTATION;  Surgeon: Nadara Mustard, MD;  Location: [facility] OR;  Service: Orthopedics;  Laterality: Right;   AMPUTATION Right 05/02/2022   Procedure: RIGHT BELOW KNEE AMPUTATION;  Surgeon: Nadara Mustard, MD;  Location[facility] OR;  Service: Orthopedics;  Laterality: Right;   APPLICATION OF WOUND VAC Right 06/13/2022   Procedure: APPLICATION OF WOUND VAC;  Surgeon: Nadara Mustard, MD;  Location: [facility] OR;  Service: Orthopedics;  Laterality: Right;   APPLICATION OF WOUND VAC Left 11/14/2022   Procedure: APPLICATION OF WOUND VAC;  Surgeon: Nadara Mustard, MD;  [facility]: [facility] OR;  Service: Orthopedics;  Laterality: Left;   AV FISTULA PLACEMENT Left 04/02/2022   Procedure: LEFT ARM ARTERIOVENOUS (AV) FISTULA CREATION;  Surgeon: Nada Libman, MD;  [facility]: [facility];  Service: Vascular;  Laterality: Left;  PERIPHERAL NERVE BLOCK   BASCILIC VEIN TRANSPOSITION Left 07/31/2022   Procedure: LEFT ARM SECOND STAGE BASILIC VEIN TRANSPOSITION;  Surgeon: Nada Libman, MD;  [facility]: [facility];  Service: Vascular;  Laterality: Left;   BIOPSY  05/27/2020   Procedure: BIOPSY;  Surgeon: Lanelle Bal, DO;  [facility]: [facility];  Service: Endoscopy;;   CATARACT EXTRACTION, BILATERAL Bilateral ?2013   COLONOSCOPY W/ POLYPECTOMY     COLONOSCOPY WITH PROPOFOL N/A 03/13/2019   Procedure: COLONOSCOPY WITH PROPOFOL;  Surgeon: Beverley Fiedler, MD;  Location: [facility] ENDOSCOPY;  Service: Gastroenterology;  Laterality: N/A;   CORONARY ANGIOPLASTY  WITH STENT PLACEMENT  1999; 2007   "1 + 1"   ERCP N/A 02/03/2022   Procedure: ENDOSCOPIC RETROGRADE CHOLANGIOPANCREATOGRAPHY (ERCP);  Surgeon: Jeani Hawking, MD;  Location: [facility] ENDOSCOPY;  Service: Gastroenterology;  Laterality: N/A;   ESOPHAGOGASTRODUODENOSCOPY N/A 02/12/2023   Procedure: ESOPHAGOGASTRODUODENOSCOPY (EGD);  Surgeon: Jeani Hawking, MD;  Location: [facility] ENDOSCOPY;  Service: Gastroenterology;  Laterality: N/A;   ESOPHAGOGASTRODUODENOSCOPY (EGD) WITH PROPOFOL N/A 03/13/2019   Procedure: ESOPHAGOGASTRODUODENOSCOPY (EGD) WITH PROPOFOL;  Surgeon: Beverley Fiedler, MD;  Location: [facility] ENDOSCOPY;  Service: Gastroenterology;  Laterality: N/A;   ESOPHAGOGASTRODUODENOSCOPY (EGD) WITH PROPOFOL N/A 05/27/2020   Procedure: ESOPHAGOGASTRODUODENOSCOPY (EGD) WITH PROPOFOL;  Surgeon: Lanelle Bal, DO;  [facility];  Service: Endoscopy;  Laterality: N/A;   ESOPHAGOGASTRODUODENOSCOPY (EGD) WITH PROPOFOL N/A 09/03/2022   Procedure: ESOPHAGOGASTRODUODENOSCOPY (EGD) WITH PROPOFOL;  Surgeon: Jeani Hawking, MD;  Location: [facility] ENDOSCOPY;  Service: Gastroenterology;  Laterality: N/A;   EYE SURGERY Bilateral    lazer   FLEXIBLE SIGMOIDOSCOPY N/A 05/23/2022   Procedure: FLEXIBLE SIGMOIDOSCOPY;  Surgeon: Jeani Hawking, MD;  Location: [facility] ENDOSCOPY;  Service: Gastroenterology;  Laterality: N/A;   FLEXIBLE SIGMOIDOSCOPY N/A 05/24/2022   Procedure: FLEXIBLE SIGMOIDOSCOPY;  Surgeon: Imogene Burn, MD;  Location: [facility] ENDOSCOPY;  Service: Gastroenterology;  Laterality: N/A;   FLEXIBLE SIGMOIDOSCOPY N/A 09/03/2022   Procedure: FLEXIBLE SIGMOIDOSCOPY;  Surgeon: Jeani Hawking, MD;  Location: [facility] ENDOSCOPY;  Service: Gastroenterology;  Laterality: N/A;   HEMOSTASIS CLIP PLACEMENT  03/13/2019   Procedure: HEMOSTASIS CLIP PLACEMENT;  Surgeon: Beverley Fiedler, MD;  Location: [facility] ENDOSCOPY;  Service: Gastroenterology;;   HEMOSTASIS CLIP PLACEMENT  05/23/2022   Procedure: HEMOSTASIS CLIP PLACEMENT;  Surgeon: Jeani Hawking,  MD;  Location: [facility] ENDOSCOPY;  Service: Gastroenterology;;   HEMOSTASIS CONTROL  05/24/2022   Procedure: HEMOSTASIS CONTROL;  Surgeon: Imogene Burn, MD;  Location: [facility] ENDOSCOPY;  Service: Gastroenterology;;   HOT HEMOSTASIS N/A 05/23/2022   Procedure: HOT HEMOSTASIS (ARGON PLASMA COAGULATION/BICAP);  Surgeon: Jeani Hawking, MD;  Location: [facility] ENDOSCOPY;  Service: Gastroenterology;  Laterality: N/A;   I & D EXTREMITY Left 05/05/2022   Procedure: IRRIGATION AND DEBRIDEMENT LEFT ARM AV FISTULA;  Surgeon: Cephus Shelling, MD;  Location: [facility] OR;  Service: Vascular;  Laterality: Left;   I & D EXTREMITY N/A 11/14/2022   Procedure: IRRIGATION AND DEBRIDEMENT OF LOWER EXTREMITY WOUND;  Surgeon: Nadara Mustard, MD;  [facility];  Service: Orthopedics;  Laterality: N/A;   INSERTION OF DIALYSIS CATHETER Right 04/02/2022   Procedure: INSERTION OF TUNNELED DIALYSIS CATHETER;  Surgeon: Nada Libman, MD;  Location: [facility] OR;  Service: Vascular;  Laterality: Right;   KNEE ARTHROSCOPY Left 10/25/2006   POLYPECTOMY  03/13/2019   Procedure: POLYPECTOMY;  Surgeon: Beverley Fiedler, MD;  Location: [facility] ENDOSCOPY;  Service: Gastroenterology;;   REMOVAL OF STONES  02/03/2022   Procedure: REMOVAL OF STONES;  Surgeon: Jeani Hawking, MD;  Location: [facility] ENDOSCOPY;  Service: Gastroenterology;;   REVISON OF ARTERIOVENOUS FISTULA Left 08/20/2022   Procedure: REVISON OF LEFT ARM ARTERIOVENOUS FISTULA;  Surgeon: Nada Libman, MD;  Location: [facility] OR;  Service: Vascular;  Laterality: Left;   RIGHT HEART CATH N/A 07/24/2017   Procedure: RIGHT HEART CATH;  Surgeon: Dolores Patty, MD;  [facility];  Service: Cardiovascular;  Laterality: N/A;   RIGHT HEART CATHETERIZATION N/A 09/22/2013   Procedure: RIGHT HEART CATH;  Surgeon: Dolores Patty, MD;  Location: [facility] CATH LAB;  Service: Cardiovascular;  Laterality: N/A;   SHOULDER ARTHROSCOPY WITH OPEN ROTATOR CUFF REPAIR Right 03/14/2014   Procedure: RIGHT  SHOULDER ARTHROSCOPY WITH BICEPS RELEASE, OPEN SUBSCAPULA REPAIR, OPEN SUPRASPINATUS REPAIR.;  Surgeon: Cammy Copa, MD;  Location: [facility] OR;  Service: Orthopedics;  Laterality: Right;   SPHINCTEROTOMY  02/03/2022   Procedure: SPHINCTEROTOMY;  Surgeon: Jeani Hawking, MD;  Location: [facility] ENDOSCOPY;  Service: Gastroenterology;;   STUMP REVISION Right 06/13/2022   Procedure: REVISION RIGHT BELOW KNEE AMPUTATION;  Surgeon: Nadara Mustard, MD;  Location: [facility] OR;  Service: Orthopedics;  Laterality: Right;   TEE WITHOUT CARDIOVERSION N/A 02/04/2022   Procedure: TRANSESOPHAGEAL ECHOCARDIOGRAM (TEE);  Surgeon: Dolores Patty, MD;  Location: [facility] ENDOSCOPY;  Service: Cardiovascular;  Laterality: N/A;   THROMBECTOMY W/ EMBOLECTOMY Left 08/20/2022   Procedure: THROMBECTOMY OF LEFT ARM ARTERIOVENOUS FISTULA;  Surgeon: Nada Libman, MD;  [facility];  Service: Vascular;  Laterality: Left;   TOE AMPUTATION Right 02/24/2018   GREAT TOE AND 2ND TOE AMPUTATION   TUBAL LIGATION  1970's    Family History  Problem Relation Age of Onset   Heart attack Mother 33    Social History   Socioeconomic History   Marital status: Married    Spouse name: Ramon Dredge   Number of children: 3   Years of education: 12th   Highest education level: Not on file  Occupational History    Employer: UNEMPLOYED  Tobacco Use   Smoking status: Former    Current packs/day: 0.00    Average packs/day: 3.0 packs/day for 32.0 years (96.0 ttl pk-yrs)    Types: Cigarettes    Start date: 10/24/1965    Quit date: 10/24/1997    Years since quitting: 25.4   Smokeless tobacco: Never  Vaping Use   Vaping status: Never Used  Substance and Sexual Activity   Alcohol use: Not Currently    Comment: "might have 2-3 daiquiris in the summer"   Drug use: No   Sexual activity: Not Currently    Birth control/protection: Surgical    Comment: Hysterectomy  Other Topics Concern   Not on file  Social History Narrative   Pt lives at home with  her spouse.Caffeine Use- 3 sodas daily.  Update:  now resides [facility]   Social Determinants of Health   Financial Resource Strain: Low Risk  (08/16/2021)   Overall Financial Resource Strain (CARDIA)    Difficulty of Paying Living Expenses: Not hard at all  Food Insecurity: No Food Insecurity (02/11/2023)   Hunger Vital Sign    Worried About Running Out of Food in the Last Year: Never true    Ran Out of Food in the Last Year: Never true  Transportation Needs: No Transportation Needs (02/11/2023)   PRAPARE - Administrator, Civil Service (Medical): No    Lack of Transportation (Non-Medical): No  Physical Activity: Inactive (08/16/2021)   Exercise Vital  Sign    Days of Exercise per Week: 0 days    Minutes of Exercise per Session: 0 min  Stress: No Stress Concern Present (08/16/2021)   [employer] of Occupational Health - Occupational Stress Questionnaire    Feeling of Stress : Not at all  Social Connections: Moderately Isolated (08/16/2021)   Social Connection and Isolation Panel [NHANES]    Frequency of Communication with Friends and Family: More than three times a week    Frequency of Social Gatherings with Friends and Family: More than three times a week    Attends Religious Services: Never    Database administrator or Organizations: No    Attends Banker Meetings: Never    Marital Status: Married  Catering manager Violence: Not At Risk (02/11/2023)   Humiliation, Afraid, Rape, and Kick questionnaire    Fear of Current or Ex-Partner: No    Emotionally Abused: No    Physically Abused: No    Sexually Abused: No    Allergies  Allergen Reactions   Cephalexin Diarrhea and Other (See Comments)   Codeine Nausea And Vomiting and Other (See Comments)    Current Outpatient Medications  Medication Sig Dispense Refill   acetaminophen (TYLENOL) 325 MG tablet Take 2 tablets (650 mg total) by mouth every 6 (six) hours as needed for mild pain (or Fever >/=  101).     amiodarone (PACERONE) 200 MG tablet Take 1 tablet (200 mg total) by mouth daily. 30 tablet 0   apixaban (ELIQUIS) 5 MG TABS tablet Take 1 tablet (5 mg total) by mouth 2 (two) times daily. 60 tablet 5   ascorbic acid (VITAMIN C) 500 MG tablet Take 1 tablet (500 mg total) by mouth daily. 100 tablet 0   atorvastatin (LIPITOR) 80 MG tablet TAKE 1 TABLET BY MOUTH EVERY DAY 90 tablet 0   carvedilol (COREG) 6.25 MG tablet Take 6.25 mg by mouth 2 (two) times daily with a meal. Take on Mon/Wed/Fri -non Dialysis days     clonazePAM (KLONOPIN) 0.5 MG disintegrating tablet Take 1 tablet (0.5 mg total) by mouth 2 (two) times daily. 60 tablet 1   folic acid (FOLVITE) 1 MG tablet Take 1 tablet (1 mg total) by mouth daily. 30 tablet 1   insulin aspart (NOVOLOG) 100 UNIT/ML injection Inject 0-15 Units into the skin 3 (three) times daily with meals. CBG < 70: treat low blood sugar, CBG 70 - 120: 0 units, CBG 121 - 150: 2 units, CBG 151 - 200: 3 units, CBG 201 - 250: 5 units, CBG 251 - 300: 8 units, CBG 301 - 350: 11 units, CBG 351 - 400: 15 units, CBG > 400: call MD 60 mL 5   insulin glargine (LANTUS) 100 UNIT/ML Solostar Pen Inject 26 Units into the skin daily. (Patient taking differently: Inject 31 Units into the skin daily.)     levothyroxine (SYNTHROID) 75 MCG tablet Take 1 tablet (75 mcg total) by mouth daily before breakfast. 30 tablet 0   LORazepam (ATIVAN) 0.5 MG tablet Take 1 tablet (0.5 mg total) by mouth at bedtime. 30 tablet 2   methocarbamol (ROBAXIN) 750 MG tablet Take 1 tablet (750 mg total) by mouth every 6 (six) hours as needed for muscle spasms. 60 tablet 0   Methoxy PEG-Epoetin Beta (MIRCERA IJ) Mircera     nitroGLYCERIN (NITROSTAT) 0.4 MG SL tablet Place 1 tablet (0.4 mg total) under the tongue every 5 (five) minutes as needed. 25 tablet 3   pantoprazole (  PROTONIX) 40 MG tablet Take 1 tablet (40 mg total) by mouth 2 (two) times daily before a meal. 180 tablet 1   polyethylene glycol  (MIRALAX / GLYCOLAX) 17 g packet Take 17 g by mouth daily. (Patient taking differently: Take 17 g by mouth See admin instructions. Every other day)     prochlorperazine (COMPAZINE) 5 MG tablet Take 1 tablet (5 mg total) by mouth every 8 (eight) hours as needed for nausea or vomiting. 60 tablet 0   venlafaxine XR (EFFEXOR-XR) 75 MG 24 hr capsule Take 1 capsule (75 mg total) by mouth daily with breakfast. Stop trintellix 30 capsule 3   Zinc Sulfate 220 (50 Zn) MG TABS Take 1 tablet (220 mg total) by mouth daily. 100 tablet 0   No current facility-administered medications for this visit.    PHYSICAL EXAM Vitals:   04/01/23 1434  BP: (!) 150/73  Pulse: 69  Temp: 98 F (36.7 C)  TempSrc: Temporal  SpO2: 99%   Chronically ill in no distress Palpable radial pulses Scar in left arm consistent with brachiobasilic fistula  PERTINENT LABORATORY AND RADIOLOGIC DATA  Most recent CBC    Latest Ref Rng & Units 02/17/2023    9:17 AM 02/15/2023   10:43 AM 02/14/2023    7:02 AM  CBC  WBC 4.0 - 10.5 K/uL 5.2  6.7  7.1   Hemoglobin 12.0 - 15.0 g/dL 13.2  44.0  9.7   Hematocrit 36.0 - 46.0 % 33.4  35.7  33.0   Platelets 150 - 400 K/uL 186  196  191      Most recent CMP    Latest Ref Rng & Units 02/17/2023    9:17 AM 02/15/2023   10:43 AM 02/14/2023    2:46 PM  CMP  Glucose 70 - 99 mg/dL 102  725  366   BUN 8 - 23 mg/dL 35  16  9   Creatinine 0.44 - 1.00 mg/dL 4.40  3.47  4.25   Sodium 135 - 145 mmol/L 127  131  134   Potassium 3.5 - 5.1 mmol/L 3.8  3.5  3.4   Chloride 98 - 111 mmol/L 94  98  98   CO2 22 - 32 mmol/L 25  25  27    Calcium 8.9 - 10.3 mg/dL 8.8  8.9  8.6   Total Protein 6.5 - 8.1 g/dL   6.2   Total Bilirubin 0.3 - 1.2 mg/dL   0.7   Alkaline Phos 38 - 126 U/L   152   AST 15 - 41 U/L   19   ALT 0 - 44 U/L   18     Renal function CrCl cannot be calculated (Patient's most recent lab result is older than the maximum 21 days allowed.).  Hgb A1c MFr Bld (% of total Hgb)  Date  Value  02/27/2023 9.0 (H)    LDL Cholesterol (Calc)  Date Value Ref Range Status  01/17/2019 65 mg/dL (calc) Final    Comment:    Reference range: <100 . Desirable range <100 mg/dL for primary prevention;   <70 mg/dL for patients with CHD or diabetic patients  with > or = 2 CHD risk factors. Marland Kitchen LDL-C is now calculated using the Martin-Hopkins  calculation, which is a validated novel method providing  better accuracy than the Friedewald equation in the  estimation of LDL-C.  Horald Pollen et al. Lenox Ahr. 9563;875(64): 2061-2068  (http://education.[facility].com/faq/FAQ164)    LDL Cholesterol  Date Value Ref Range Status  02/01/2022 29 0 - 99 mg/dL Final    Comment:           Total Cholesterol/HDL:CHD Risk Coronary Heart Disease Risk Table                     Men   Women  1/2 Average Risk   3.4   3.3  Average Risk       5.0   4.4  2 X Average Risk   9.6   7.1  3 X Average Risk  23.4   11.0        Use the calculated Patient Ratio above and the CHD Risk Table to determine the patient's CHD Risk.        ATP III CLASSIFICATION (LDL):  <100     mg/dL   Optimal  161-096  mg/dL   Near or Above                    Optimal  130-159  mg/dL   Borderline  045-409  mg/dL   High  >811     mg/dL   Very High Performed at [facility], [address] [place], [place] [address]     Right basilic vein looks good.  Rande Brunt. Lenell Antu, MD [facility] Phone Number: [phone] 04/01/2023 4:50 PM   Total time spent on preparing this encounter including chart review, data review, collecting history, examining the patient, coordinating care for this established patient, 30 minutes.  Portions of this report may have been transcribed using voice recognition software.  Every effort has been made to ensure accuracy; however, inadvertent computerized transcription errors may still be present.

## 2023-04-01 NOTE — Therapy (Signed)
OUTPATIENT PHYSICAL THERAPY PROSTHETIC TREATMENT   Patient Name: Susan [name] MRN: [id number] DOB:02/04/1951, 72 y.o., female Today's Date: 04/01/2023  PCP: Donita Brooks, MD  REFERRING PROVIDER: Nadara Mustard, MD  END OF SESSION:  PT End of Session - 04/01/23 0915     Visit Number 3    Number of Visits 25    Date for PT Re-Evaluation 06/18/23    Authorization Type [employer]    Progress Note Due on Visit 10    PT Start Time 0908    PT Stop Time 0950    PT Time Calculation (min) 42 min    Equipment Utilized During Treatment Gait belt    Activity Tolerance Patient tolerated treatment well;Patient limited by fatigue    Behavior During Therapy WFL for tasks assessed/performed               Past Medical History:  Diagnosis Date   Anemia    hx   Anxiety    Arthritis    "generalized" (03/15/2014)   CAD (coronary artery disease)    MI in 2000 - MI  2007 - treated bare metal stent (no nuclear since then as 9/11)   Carotid artery disease (HCC)    Chronic diastolic heart failure (HCC)    a) ECHO (08/2013) EF 55-60% and RV function nl b) RHC (08/2013) RA 4, RV 30/5/7, PA 25/10 (16), PCWP 7, Fick CO/CI 6.3/2.7, PVR 1.5 WU, PA 61 and 66%   Daily headache    "~ every other day; since I fell in June" (03/15/2014)   Depression    Diabetic retinopathy (HCC)    Dyslipidemia    ESRD (end stage renal disease) (HCC)    Dialysis on Tues Thurs Sat   Exertional shortness of breath    History of kidney stones    HTN (hypertension)    Hypothyroidism    Obesity    Osteoarthritis    PAF (paroxysmal atrial fibrillation) (HCC)    Peripheral neuropathy    bilateral feet/hands   PONV (postoperative nausea and vomiting)    RBBB (right bundle branch block)    Old   Stroke (HCC)    mini strokes   Type II diabetes mellitus (HCC)    Type II, [name]. patient has omnipod insulin pump with Novolin R Insulin   Past Surgical History:  Procedure  Laterality Date   A/V FISTULAGRAM Left 11/07/2022   Procedure: A/V Fistulagram;  Surgeon: Leonie Douglas, MD;  [facility]: [facility];  Service: Cardiovascular;  Laterality: Left;   ABDOMINAL HYSTERECTOMY  1980's   AMPUTATION Right 02/24/2018   Procedure: RIGHT FOOT GREAT TOE AND 2ND TOE AMPUTATION;  Surgeon: Nadara Mustard, MD;  Location: [facility] OR;  Service: Orthopedics;  Laterality: Right;   AMPUTATION Right 04/30/2018   Procedure: RIGHT TRANSMETATARSAL AMPUTATION;  Surgeon: Nadara Mustard, MD;  Location: [facility] OR;  Service: Orthopedics;  Laterality: Right;   AMPUTATION Right 05/02/2022   Procedure: RIGHT BELOW KNEE AMPUTATION;  Surgeon: Nadara Mustard, MD;  Location: [facility] OR;  Service: Orthopedics;  Laterality: Right;   APPLICATION OF WOUND VAC Right 06/13/2022   Procedure: APPLICATION OF WOUND VAC;  Surgeon: Nadara Mustard, MD;  Location: [facility] OR;  Service: Orthopedics;  Laterality: Right;   APPLICATION OF WOUND VAC Left 11/14/2022   Procedure: APPLICATION OF WOUND VAC;  Surgeon: Nadara Mustard, MD;  Location: [facility] OR;  Service: Orthopedics;  Laterality: Left;   AV FISTULA  PLACEMENT Left 04/02/2022   Procedure: LEFT ARM ARTERIOVENOUS (AV) FISTULA CREATION;  Surgeon: Nada Libman, MD;  Location: [facility] OR;  Service: Vascular;  Laterality: Left;  PERIPHERAL NERVE BLOCK   BASCILIC VEIN TRANSPOSITION Left 07/31/2022   Procedure: LEFT ARM SECOND STAGE BASILIC VEIN TRANSPOSITION;  Surgeon: Nada Libman, MD;  Location: [facility] OR;  Service: Vascular;  Laterality: Left;   BIOPSY  05/27/2020   Procedure: BIOPSY;  Surgeon: Lanelle Bal, DO;  [facility]: [facility];  Service: Endoscopy;;   CATARACT EXTRACTION, BILATERAL Bilateral ?2013   COLONOSCOPY W/ POLYPECTOMY     COLONOSCOPY WITH PROPOFOL N/A 03/13/2019   Procedure: COLONOSCOPY WITH PROPOFOL;  Surgeon: Beverley Fiedler, MD;  Location: [facility] ENDOSCOPY;  Service: Gastroenterology;  Laterality: N/A;   CORONARY ANGIOPLASTY WITH STENT PLACEMENT  1999; 2007   "1 +  1"   ERCP N/A 02/03/2022   Procedure: ENDOSCOPIC RETROGRADE CHOLANGIOPANCREATOGRAPHY (ERCP);  Surgeon: Jeani Hawking, MD;  Location: [facility] ENDOSCOPY;  Service: Gastroenterology;  Laterality: N/A;   ESOPHAGOGASTRODUODENOSCOPY N/A 02/12/2023   Procedure: ESOPHAGOGASTRODUODENOSCOPY (EGD);  Surgeon: Jeani Hawking, MD;  Location: [facility] ENDOSCOPY;  Service: Gastroenterology;  Laterality: N/A;   ESOPHAGOGASTRODUODENOSCOPY (EGD) WITH PROPOFOL N/A 03/13/2019   Procedure: ESOPHAGOGASTRODUODENOSCOPY (EGD) WITH PROPOFOL;  Surgeon: Beverley Fiedler, MD;  Location: [facility] ENDOSCOPY;  Service: Gastroenterology;  Laterality: N/A;   ESOPHAGOGASTRODUODENOSCOPY (EGD) WITH PROPOFOL N/A 05/27/2020   Procedure: ESOPHAGOGASTRODUODENOSCOPY (EGD) WITH PROPOFOL;  Surgeon: Lanelle Bal, DO;  [facility];  Service: Endoscopy;  Laterality: N/A;   ESOPHAGOGASTRODUODENOSCOPY (EGD) WITH PROPOFOL N/A 09/03/2022   Procedure: ESOPHAGOGASTRODUODENOSCOPY (EGD) WITH PROPOFOL;  Surgeon: Jeani Hawking, MD;  Location: [facility] ENDOSCOPY;  Service: Gastroenterology;  Laterality: N/A;   EYE SURGERY Bilateral    lazer   FLEXIBLE SIGMOIDOSCOPY N/A 05/23/2022   Procedure: FLEXIBLE SIGMOIDOSCOPY;  Surgeon: Jeani Hawking, MD;  Location: [facility] ENDOSCOPY;  Service: Gastroenterology;  Laterality: N/A;   FLEXIBLE SIGMOIDOSCOPY N/A 05/24/2022   Procedure: FLEXIBLE SIGMOIDOSCOPY;  Surgeon: Imogene Burn, MD;  Location: [facility] ENDOSCOPY;  Service: Gastroenterology;  Laterality: N/A;   FLEXIBLE SIGMOIDOSCOPY N/A 09/03/2022   Procedure: FLEXIBLE SIGMOIDOSCOPY;  Surgeon: Jeani Hawking, MD;  Location: [facility] ENDOSCOPY;  Service: Gastroenterology;  Laterality: N/A;   HEMOSTASIS CLIP PLACEMENT  03/13/2019   Procedure: HEMOSTASIS CLIP PLACEMENT;  Surgeon: Beverley Fiedler, MD;  Location: [facility] ENDOSCOPY;  Service: Gastroenterology;;   HEMOSTASIS CLIP PLACEMENT  05/23/2022   Procedure: HEMOSTASIS CLIP PLACEMENT;  Surgeon: Jeani Hawking, MD;  Location: [facility] ENDOSCOPY;  Service:  Gastroenterology;;   HEMOSTASIS CONTROL  05/24/2022   Procedure: HEMOSTASIS CONTROL;  Surgeon: Imogene Burn, MD;  Location: [facility] ENDOSCOPY;  Service: Gastroenterology;;   HOT HEMOSTASIS N/A 05/23/2022   Procedure: HOT HEMOSTASIS (ARGON PLASMA COAGULATION/BICAP);  Surgeon: Jeani Hawking, MD;  Location: [facility] ENDOSCOPY;  Service: Gastroenterology;  Laterality: N/A;   I & D EXTREMITY Left 05/05/2022   Procedure: IRRIGATION AND DEBRIDEMENT LEFT ARM AV FISTULA;  Surgeon: Cephus Shelling, MD;  Location: [facility] OR;  Service: Vascular;  Laterality: Left;   I & D EXTREMITY N/A 11/14/2022   Procedure: IRRIGATION AND DEBRIDEMENT OF LOWER EXTREMITY WOUND;  Surgeon: Nadara Mustard, MD;  Location: [facility] OR;  Service: Orthopedics;  Laterality: N/A;   INSERTION OF DIALYSIS CATHETER Right 04/02/2022   Procedure: INSERTION OF TUNNELED DIALYSIS CATHETER;  Surgeon: Nada Libman, MD;  Location: [facility] OR;  Service: Vascular;  Laterality: Right;   KNEE ARTHROSCOPY Left 10/25/2006   POLYPECTOMY  03/13/2019   Procedure: POLYPECTOMY;  Surgeon: Beverley Fiedler, MD;  Location: [facility] ENDOSCOPY;  Service: Gastroenterology;;   REMOVAL OF STONES  02/03/2022   Procedure: REMOVAL OF STONES;  Surgeon: Jeani Hawking, MD;  Location: [facility] ENDOSCOPY;  Service: Gastroenterology;;   REVISON OF ARTERIOVENOUS FISTULA Left 08/20/2022   Procedure: REVISON OF LEFT ARM ARTERIOVENOUS FISTULA;  Surgeon: Nada Libman, MD;  Location: [facility] OR;  Service: Vascular;  Laterality: Left;   RIGHT HEART CATH N/A 07/24/2017   Procedure: RIGHT HEART CATH;  Surgeon: Dolores Patty, MD;  [facility];  Service: Cardiovascular;  Laterality: N/A;   RIGHT HEART CATHETERIZATION N/A 09/22/2013   Procedure: RIGHT HEART CATH;  Surgeon: Dolores Patty, MD;  Location: [facility] CATH LAB;  Service: Cardiovascular;  Laterality: N/A;   SHOULDER ARTHROSCOPY WITH OPEN ROTATOR CUFF REPAIR Right 03/14/2014   Procedure: RIGHT SHOULDER ARTHROSCOPY WITH BICEPS RELEASE,  OPEN SUBSCAPULA REPAIR, OPEN SUPRASPINATUS REPAIR.;  Surgeon: Cammy Copa, MD;  Location: [facility] OR;  Service: Orthopedics;  Laterality: Right;   SPHINCTEROTOMY  02/03/2022   Procedure: SPHINCTEROTOMY;  Surgeon: Jeani Hawking, MD;  Location: [facility] ENDOSCOPY;  Service: Gastroenterology;;   STUMP REVISION Right 06/13/2022   Procedure: REVISION RIGHT BELOW KNEE AMPUTATION;  Surgeon: Nadara Mustard, MD;  Location: [facility] OR;  Service: Orthopedics;  Laterality: Right;   TEE WITHOUT CARDIOVERSION N/A 02/04/2022   Procedure: TRANSESOPHAGEAL ECHOCARDIOGRAM (TEE);  Surgeon: Dolores Patty, MD;  Location: [facility] ENDOSCOPY;  Service: Cardiovascular;  Laterality: N/A;   THROMBECTOMY W/ EMBOLECTOMY Left 08/20/2022   Procedure: THROMBECTOMY OF LEFT ARM ARTERIOVENOUS FISTULA;  Surgeon: Nada Libman, MD;  Location: [facility] OR;  Service: Vascular;  Laterality: Left;   TOE AMPUTATION Right 02/24/2018   GREAT TOE AND 2ND TOE AMPUTATION   TUBAL LIGATION  1970's   Patient Active Problem List   Diagnosis Date Noted   Hyperosmolar hyperglycemic state (HHS) (HCC) 02/05/2023   Depression 01/29/2023   Nausea 01/24/2023   Intractable nausea and vomiting 01/20/2023   Proctitis 01/15/2023   Acute on chronic anemia 11/25/2022   Right below-knee amputee (HCC) 11/20/2022   Cellulitis of left lower extremity 11/14/2022   Maggot infestation 11/12/2022   Complicated wound infection 11/10/2022   Secondary hypercoagulable state (HCC) 09/04/2022   Right sided abdominal pain 08/31/2022   Constipation 06/07/2022   History of Clostridioides difficile colitis 06/06/2022   Below-knee amputation of right lower extremity (HCC) 06/06/2022   Diverticulitis 06/05/2022   Stercoral colitis 06/05/2022   C. difficile colitis 06/05/2022   Spleen hematoma 06/05/2022   Dehiscence of amputation stump of right lower extremity (HCC) 06/05/2022   Rectal ulcer 05/27/2022   ESRD (end stage renal disease) (HCC) 05/27/2022   GI bleed 05/23/2022    Difficult intravenous access 05/23/2022   Gangrene of right foot (HCC) 05/02/2022   S/P BKA (below knee amputation) unilateral, right (HCC) 05/02/2022   Unspecified protein-calorie malnutrition (HCC) 04/15/2022   Secondary hyperparathyroidism of renal origin (HCC) 04/14/2022   Coagulation defect, unspecified (HCC) 04/09/2022   Acquired absence of other left toe(s) (HCC) 04/07/2022   Allergy, unspecified, initial encounter 04/07/2022   Dependence on renal dialysis (HCC) 04/07/2022   Gout due to renal impairment, unspecified site 04/07/2022   Hypertensive heart and chronic kidney disease with heart failure and with stage 5 chronic kidney disease, or end stage renal disease (HCC) 04/07/2022   Personal history of transient ischemic attack (TIA), and cerebral infarction without residual deficits 04/07/2022   Renal osteodystrophy 04/07/2022   Venous stasis ulcer of  right calf (HCC) 03/31/2022   Fistula, colovaginal 03/26/2022   Diarrhea 03/26/2022   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   Sepsis without acute organ dysfunction (HCC)    Bacteremia    Acute pancreatitis 02/01/2022   Abdominal pain 02/01/2022   SIRS (systemic inflammatory response syndrome) (HCC) 02/01/2022   Transaminitis 02/01/2022   History of anemia due to chronic kidney disease 02/01/2022   Paroxysmal atrial fibrillation (HCC) 02/01/2022   Uncontrolled type 2 diabetes mellitus with hyperglycemia, with long-term current use of insulin (HCC) 01/14/2022   NSTEMI (non-ST elevated myocardial infarction) (HCC) 03/05/2021   Acute renal failure superimposed on stage 4 chronic kidney disease (HCC) 08/22/2020   Hypoalbuminemia 05/25/2020   GERD (gastroesophageal reflux disease) 05/25/2020   Pressure injury of skin 05/17/2020   Acute on chronic combined systolic and diastolic congestive heart failure (HCC) 03/07/2020   Type 2 diabetes mellitus with diabetic polyneuropathy, with long-term current use of insulin (HCC) 03/07/2020    Obesity, Class III, BMI 40-49.9 (morbid obesity) (HCC) 03/07/2020   Common bile duct (CBD) obstruction 05/28/2019   Benign neoplasm of ascending colon    Benign neoplasm of transverse colon    Benign neoplasm of descending colon    Benign neoplasm of sigmoid colon    Gastric polyps    Hyperkalemia 03/11/2019   Prolonged QT interval 03/11/2019   Acute blood loss anemia 03/11/2019   Onychomycosis 06/21/2018   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)    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 (HCC)    PVD (peripheral vascular disease) (HCC) 10/26/2017   E-coli UTI 07/27/2017   Hypothyroidism 07/27/2017   AKI (acute kidney injury) (HCC)    PAH (pulmonary artery hypertension) (HCC)    Impaired ambulation 07/19/2017   Nausea & vomiting 07/15/2017   Leg cramps 02/27/2017   Peripheral edema 01/12/2017   Diabetic neuropathy (HCC) 11/12/2016   CKD (chronic kidney disease), stage IV (HCC) 10/24/2015   Anemia of chronic disease 10/03/2015   Historical diagnosis of generalized anxiety disorder 10/03/2015   Secondary insomnia 10/03/2015   Hyperglycemia due to diabetes mellitus (HCC) 06/07/2015   Non compliance with medical treatment 04/17/2014   Rotator cuff tear 03/14/2014   Obesity 09/23/2013   Chronic HFrEF (heart failure with reduced ejection fraction) (HCC) 06/03/2013   Hypotension 12/25/2012   Hyponatremia 12/25/2012   Urinary incontinence    MDD (major depressive disorder), recurrent episode, moderate (HCC) 11/12/2010   RBBB (right bundle branch block)    Wide-complex tachycardia    Coronary artery disease    Hyperlipemia 01/22/2009   Chronic hypotension 01/22/2009    ONSET DATE: 03/09/2023 MD referral to PT  REFERRING DIAG: S88.111D (ICD-10-CM) - Below-knee amputation of right lower extremity, subsequent encounter   THERAPY DIAG:  Unsteadiness on feet  Muscle weakness (generalized)  Phantom limb syndrome with pain (HCC)  Other abnormalities of  gait and mobility  Impaired functional mobility, balance, gait, and endurance  Rationale for Evaluation and Treatment: Rehabilitation  SUBJECTIVE:   SUBJECTIVE STATEMENT: She has muscle soreness from new activities in last PT session but no more than she would expect. She wore prosthesis after dialysis for 2 hours yesterday. Dialysis was hard yesterday.   PERTINENT HISTORY: Right TTA 05/02/22, Lymphedema, idiopathic chronic venous HTN with ulcer, depression, colitis, diverticulitis, ESRD, gout, NSTEMI, DM2, polyneuropathy, PVD, CAD, right bundle branch block, mini strokes  PAIN:  Are you having pain? Phantom pain with no known aggravation.   PRECAUTIONS: Fall and Other: No BP LUE  WEIGHT BEARING RESTRICTIONS: No  FALLS: Has patient fallen in last 6 months? No  LIVING ENVIRONMENT: Lives with: lives with their spouse and 2 small dogs Lives in: mobile home Home Access: Ramped entrance Home layout: One level Stairs: Yes: External: 6-7 steps; can reach both Has following equipment at home: Single point cane, Environmental consultant - 2 wheeled, Environmental consultant - 4 wheeled, Wheelchair (manual), [employer], Grab bars, and Ramped entry  OCCUPATION:  retired  PLOF: prior to amputation for ~year used RW in home & community  PATIENT GOALS:  to use prosthesis to walk, get out w/c in & out of car, get to bathroom.   OBJECTIVE:  COGNITION: Overall cognitive status:  Eval on 03/23/2023:   Within functional limits for tasks assessed  POSTURE:  Eval on 03/23/2023:  rounded shoulders, forward head, increased thoracic kyphosis, flexed trunk , and weight shift left  LOWER EXTREMITY ROM: ROM Right eval Left eval  Hip flexion    Hip extension    Hip abduction    Hip adduction    Hip internal rotation    Hip external rotation    Knee flexion    Knee extension    Ankle dorsiflexion    Ankle plantarflexion    Ankle inversion    Ankle eversion     (Blank rows = not tested)  LOWER EXTREMITY MMT:  MMT  Right eval Left eval  Hip flexion 3-/5 3-/5  Hip extension 2/5 2/5  Hip abduction 2+/5 2+/5  Hip adduction    Hip internal rotation    Hip external rotation    Knee flexion 3-/5 3-/5  Knee extension 3-/5 3-/5  Ankle dorsiflexion    Ankle plantarflexion    Ankle inversion    Ankle eversion    At Evaluation all strength testing is grossly seated and functionally standing / gait. (Blank rows = not tested)  TRANSFERS:  Eval on 03/23/2023:  Sit to stand: Max A (two-person assist) from 22" w/c to rolling walker Stand to sit: Max A (two-person assist) rolling walker to wheelchair  FUNCTIONAL TESTs:   Eval on 03/23/2023: Patient maintained upright holding rolling walker with mod assist 2 people for safety for 30 seconds.  GAIT: Eval on 03/23/2023:  Patient took 2 steps (one per LE) with +2 max assist with rolling walker and TTA prosthesis.  Patient adducting prosthesis stepping on left foot with no awareness.  CURRENT PROSTHETIC WEAR ASSESSMENT:  Eval on 03/23/2023:   Patient is dependent with: skin check, residual limb care, prosthetic cleaning, ply sock cleaning, correct ply sock adjustment, proper wear schedule/adjustment, and proper weight-bearing schedule/adjustment Donning prosthesis: Max A Doffing prosthesis: Min A Prosthetic wear tolerance: 2-6 hours, 1x/day, 3-4 days/week Prosthetic weight bearing tolerance: 3 minutes with limb pain Edema: pitting edema Residual limb condition: No open areas, dry skin, normal color and temperature, cylindrical shape Prosthetic description: Silicone liner with pin lock suspension, total contact socket with flexible inner socket, sach foot    TODAY'S TREATMENT:  DATE:  04/01/2023: Therapeutic Activities: Scooting transfer w/c <> Nustep with modA with cues on technique.  Sitting on 24" bar stool with feet on floor  inside //bars: Single UE red Tband exercises for core stabilization with cues for upright posture with single UE support 5 reps 2 sets ea UE: row, biceps curl and forward reach. Sit to/from stand pulling on //bars with modA and static stance 5 sec 5 reps. Final rep only CGA to stand upright for 5 sec. Sit to stand from 21" w/c with maxA and modA to control descent Trunk motions for core stability without UE support but close supervision: 5 reps ea trunk rotation right/left, controlled back lean with sit up with target 5" behind and forward controlled lean with sit up with target 5" anterior  Therapeutic  Exercise: Nustep seat 9 level 5 with BLEs & BUEs 4 min 2 sets with timed 2 min rest bw sets. HR 92 SpO2 99%   03/30/2023: Prosthetic Training with Transtibial Amputation: PT cued on wear most of awake hours including after dialysis if out of bed. PT demo & verbal cues on patella centered in patella relief area.  Pt & husband verbalized understanding.    Therapeutic Activities: PT demo & verbal cues on sit to/from stand technique. Sit to stand from 21" w/c to sink with RUE on sink & LUE on w/c with maxA.  Stand to sit sink to w/c reaching LUE to w/c with modA. Standing at sink with pelvic weight shifts right/midline/left with tactile & verbal cues on proprioception with socket / limb interface.  Pt sat on 24" bar stool without UE support with both feet on floor with 3 min with supervision. Sit to stand BUE on sink to bar stool with modA. PT cued on rationale to build trunk stability & endurance in upright position.   Scooting transfer w/c <> Nustep with modA with cues on technique.   Therapeutic  Exercise: Nustep seat 9 level 5 with BLEs & BUEs 3 min 2 sets with timed 2 min rest bw sets. LLE with Theraband sling.  Pt reports she could tell it would help her legs & arms to get stronger.     03/23/2023: Prosthetic Training with Transtibial Amputation: See patient education noted  below  PATIENT EDUCATION: PATIENT EDUCATED ON FOLLOWING PROSTHETIC CARE: Education details: Use of stockinette under proximal liner to decrease itching sensation, no wounds presents to PT recommended not using Vive wear under liner Skin check, Prosthetic cleaning, Propper donning, and Proper wear schedule/adjustment Prosthetic wear tolerance: 3 hours 2x/day, 4 days/week Person educated: Patient and Spouse Education method: Explanation, Tactile cues, and Verbal cues Education comprehension: verbalized understanding, verbal cues required, tactile cues required, and needs further education  HOME EXERCISE PROGRAM:  ASSESSMENT:  CLINICAL IMPRESSION: PT began therapeutic activities to work on core stabilization which she tolerated with low reps and rest between activities.   Pt continues to benefit from skilled PT.   OBJECTIVE IMPAIRMENTS: Abnormal gait, decreased activity tolerance, decreased balance, decreased endurance, decreased knowledge of condition, decreased knowledge of use of DME, decreased mobility, difficulty walking, decreased ROM, decreased strength, decreased safety awareness, postural dysfunction, prosthetic dependency , and pain.   ACTIVITY LIMITATIONS: standing, transfers, and locomotion level  PARTICIPATION LIMITATIONS: community activity, household mobility and dependency / burden of care on family  PERSONAL FACTORS: Age, Fitness, Past/current experiences, Time since onset of injury/illness/exacerbation, and 3+ comorbidities: see PMH  are also affecting patient's functional outcome.   REHAB POTENTIAL: Good  CLINICAL DECISION MAKING:  Evolving/moderate complexity  EVALUATION COMPLEXITY: Moderate   GOALS: Goals reviewed with patient? Yes  SHORT TERM GOALS: Target date: 04/22/2023:  Patient donnes prosthesis correctly with husband's assist & verbalizes proper cleaning. Baseline: SEE OBJECTIVE DATA Goal status: Ongoing 03/30/2023 2.  Patient tolerates prosthesis wear on  non-dailysis days >8 hrs total /day and on dialysis days >2 hours/day without skin issues or limb pain >5/10. Baseline: SEE OBJECTIVE DATA Goal status: Ongoing 03/30/2023  3.  Patient able to stand with rolling walker with 1 person modA for 1 min.  Baseline: SEE OBJECTIVE DATA Goal status: Ongoing 03/30/2023  4. Patient ambulates 5' with RW & prosthesis with +2 maxA.  Baseline: SEE OBJECTIVE DATA Goal status: Ongoing 03/30/2023  5. Patient sit to/from stand transfer with +1 maxA and scooting transfer with modA. Baseline: SEE OBJECTIVE DATA Goal status: Ongoing 03/30/2023  LONG TERM GOALS: Target date: 06/17/2022  Patient & husband demonstrates & verbalizes understanding of prosthetic care to enable safe utilization of prosthesis. Baseline: SEE OBJECTIVE DATA Goal status: Ongoing 03/30/2023  Patient tolerates prosthesis wear >80% of awake hours on non-dialysis days and >50% on dialysis days without skin issues and limb pain </= 4/10. Baseline: SEE OBJECTIVE DATA Goal status: Ongoing 03/30/2023  Scooting transfers with minA and sit to/from stand transfers with modA.  Baseline: SEE OBJECTIVE DATA Goal status: Ongoing 03/30/2023  Patient ambulates >10' with prosthesis & RW with modA.  Baseline: SEE OBJECTIVE DATA Goal status: Ongoing 03/30/2023  Patient stands with RW support with minA or less for 2 min.  Baseline: SEE OBJECTIVE DATA Goal status:   Ongoing 03/30/2023   PLAN:  PT FREQUENCY: 2x/week  PT DURATION: 12 weeks  PLANNED INTERVENTIONS: 97164- PT Re-evaluation, 97110-Therapeutic exercises, 97530- Therapeutic activity, 97112- Neuromuscular re-education, [phone]- Self Care, 40347- Gait training, [phone]- Prosthetic training, Patient/Family education, Balance training, DME instructions, Therapeutic exercises, Therapeutic activity, Neuromuscular re-education, Gait training, and Self Care  PLAN FOR NEXT SESSION Nustep.  Core stabilization on bar stool.     Vladimir Faster, PT,  DPT 04/01/2023, 10:36 AM

## 2023-04-03 ENCOUNTER — Telehealth: Payer: Self-pay

## 2023-04-03 ENCOUNTER — Other Ambulatory Visit: Payer: Self-pay

## 2023-04-03 DIAGNOSIS — Z992 Dependence on renal dialysis: Secondary | ICD-10-CM

## 2023-04-03 NOTE — Telephone Encounter (Signed)
Attempted to reach pt to schedule her surgery. Her spouse answered and asked that I call landline, as he was in the woods and she was home. I tried several times to reach her on their landline and did not get her. No VM to leave message.

## 2023-04-05 ENCOUNTER — Other Ambulatory Visit (HOSPITAL_COMMUNITY): Payer: Self-pay | Admitting: Internal Medicine

## 2023-04-06 ENCOUNTER — Encounter: Payer: Self-pay | Admitting: Physical Therapy

## 2023-04-06 ENCOUNTER — Ambulatory Visit: Payer: PPO | Admitting: Physical Therapy

## 2023-04-06 ENCOUNTER — Other Ambulatory Visit: Payer: Self-pay | Admitting: Family Medicine

## 2023-04-06 DIAGNOSIS — R2681 Unsteadiness on feet: Secondary | ICD-10-CM

## 2023-04-06 DIAGNOSIS — Z7409 Other reduced mobility: Secondary | ICD-10-CM | POA: Diagnosis not present

## 2023-04-06 DIAGNOSIS — M6281 Muscle weakness (generalized): Secondary | ICD-10-CM | POA: Diagnosis not present

## 2023-04-06 DIAGNOSIS — G546 Phantom limb syndrome with pain: Secondary | ICD-10-CM

## 2023-04-06 DIAGNOSIS — R2689 Other abnormalities of gait and mobility: Secondary | ICD-10-CM

## 2023-04-06 NOTE — Therapy (Signed)
OUTPATIENT PHYSICAL THERAPY PROSTHETIC TREATMENT   Patient Name: Susan [name] MRN: [id number] DOB:January 03, 1951, 72 y.o., female Today's Date: 04/06/2023  PCP: Donita Brooks, MD  REFERRING PROVIDER: Nadara Mustard, MD  END OF SESSION:  PT End of Session - 04/06/23 [phone]     Visit Number 4    Number of Visits 25    Date for PT Re-Evaluation 06/18/23    Authorization Type [employer]    Progress Note Due on Visit 10    PT Start Time 0927    PT Stop Time 1013    PT Time Calculation (min) 46 min    Equipment Utilized During Treatment Gait belt    Activity Tolerance Patient tolerated treatment well;Patient limited by fatigue    Behavior During Therapy WFL for tasks assessed/performed                Past Medical History:  Diagnosis Date   Anemia    hx   Anxiety    Arthritis    "generalized" (03/15/2014)   CAD (coronary artery disease)    MI in 2000 - MI  2007 - treated bare metal stent (no nuclear since then as 9/11)   Carotid artery disease (HCC)    Chronic diastolic heart failure (HCC)    a) ECHO (08/2013) EF 55-60% and RV function nl b) RHC (08/2013) RA 4, RV 30/5/7, PA 25/10 (16), PCWP 7, Fick CO/CI 6.3/2.7, PVR 1.5 WU, PA 61 and 66%   Daily headache    "~ every other day; since I fell in June" (03/15/2014)   Depression    Diabetic retinopathy (HCC)    Dyslipidemia    ESRD (end stage renal disease) (HCC)    Dialysis on Tues Thurs Sat   Exertional shortness of breath    History of kidney stones    HTN (hypertension)    Hypothyroidism    Obesity    Osteoarthritis    PAF (paroxysmal atrial fibrillation) (HCC)    Peripheral neuropathy    bilateral feet/hands   PONV (postoperative nausea and vomiting)    RBBB (right bundle branch block)    Old   Stroke (HCC)    mini strokes   Type II diabetes mellitus (HCC)    Type II, [name]. patient has omnipod insulin pump with Novolin R Insulin   Past Surgical History:  Procedure  Laterality Date   A/V FISTULAGRAM Left 11/07/2022   Procedure: A/V Fistulagram;  Surgeon: Leonie Douglas, MD;  [facility]: [facility];  Service: Cardiovascular;  Laterality: Left;   ABDOMINAL HYSTERECTOMY  1980's   AMPUTATION Right 02/24/2018   Procedure: RIGHT FOOT GREAT TOE AND 2ND TOE AMPUTATION;  Surgeon: Nadara Mustard, MD;  Location: [facility] OR;  Service: Orthopedics;  Laterality: Right;   AMPUTATION Right 04/30/2018   Procedure: RIGHT TRANSMETATARSAL AMPUTATION;  Surgeon: Nadara Mustard, MD;  Location: [facility] OR;  Service: Orthopedics;  Laterality: Right;   AMPUTATION Right 05/02/2022   Procedure: RIGHT BELOW KNEE AMPUTATION;  Surgeon: Nadara Mustard, MD;  Location: [facility] OR;  Service: Orthopedics;  Laterality: Right;   APPLICATION OF WOUND VAC Right 06/13/2022   Procedure: APPLICATION OF WOUND VAC;  Surgeon: Nadara Mustard, MD;  Location: [facility] OR;  Service: Orthopedics;  Laterality: Right;   APPLICATION OF WOUND VAC Left 11/14/2022   Procedure: APPLICATION OF WOUND VAC;  Surgeon: Nadara Mustard, MD;  Location: [facility] OR;  Service: Orthopedics;  Laterality: Left;   AV  FISTULA PLACEMENT Left 04/02/2022   Procedure: LEFT ARM ARTERIOVENOUS (AV) FISTULA CREATION;  Surgeon: Nada Libman, MD;  Location: [facility] OR;  Service: Vascular;  Laterality: Left;  PERIPHERAL NERVE BLOCK   BASCILIC VEIN TRANSPOSITION Left 07/31/2022   Procedure: LEFT ARM SECOND STAGE BASILIC VEIN TRANSPOSITION;  Surgeon: Nada Libman, MD;  Location: [facility] OR;  Service: Vascular;  Laterality: Left;   BIOPSY  05/27/2020   Procedure: BIOPSY;  Surgeon: Lanelle Bal, DO;  [facility]: [facility];  Service: Endoscopy;;   CATARACT EXTRACTION, BILATERAL Bilateral ?2013   COLONOSCOPY W/ POLYPECTOMY     COLONOSCOPY WITH PROPOFOL N/A 03/13/2019   Procedure: COLONOSCOPY WITH PROPOFOL;  Surgeon: Beverley Fiedler, MD;  Location: [facility] ENDOSCOPY;  Service: Gastroenterology;  Laterality: N/A;   CORONARY ANGIOPLASTY WITH STENT PLACEMENT  1999; 2007   "1 +  1"   ERCP N/A 02/03/2022   Procedure: ENDOSCOPIC RETROGRADE CHOLANGIOPANCREATOGRAPHY (ERCP);  Surgeon: Jeani Hawking, MD;  Location: [facility] ENDOSCOPY;  Service: Gastroenterology;  Laterality: N/A;   ESOPHAGOGASTRODUODENOSCOPY N/A 02/12/2023   Procedure: ESOPHAGOGASTRODUODENOSCOPY (EGD);  Surgeon: Jeani Hawking, MD;  Location: [facility] ENDOSCOPY;  Service: Gastroenterology;  Laterality: N/A;   ESOPHAGOGASTRODUODENOSCOPY (EGD) WITH PROPOFOL N/A 03/13/2019   Procedure: ESOPHAGOGASTRODUODENOSCOPY (EGD) WITH PROPOFOL;  Surgeon: Beverley Fiedler, MD;  Location: [facility] ENDOSCOPY;  Service: Gastroenterology;  Laterality: N/A;   ESOPHAGOGASTRODUODENOSCOPY (EGD) WITH PROPOFOL N/A 05/27/2020   Procedure: ESOPHAGOGASTRODUODENOSCOPY (EGD) WITH PROPOFOL;  Surgeon: Lanelle Bal, DO;  [facility];  Service: Endoscopy;  Laterality: N/A;   ESOPHAGOGASTRODUODENOSCOPY (EGD) WITH PROPOFOL N/A 09/03/2022   Procedure: ESOPHAGOGASTRODUODENOSCOPY (EGD) WITH PROPOFOL;  Surgeon: Jeani Hawking, MD;  Location: [facility] ENDOSCOPY;  Service: Gastroenterology;  Laterality: N/A;   EYE SURGERY Bilateral    lazer   FLEXIBLE SIGMOIDOSCOPY N/A 05/23/2022   Procedure: FLEXIBLE SIGMOIDOSCOPY;  Surgeon: Jeani Hawking, MD;  Location: [facility] ENDOSCOPY;  Service: Gastroenterology;  Laterality: N/A;   FLEXIBLE SIGMOIDOSCOPY N/A 05/24/2022   Procedure: FLEXIBLE SIGMOIDOSCOPY;  Surgeon: Imogene Burn, MD;  Location: [facility] ENDOSCOPY;  Service: Gastroenterology;  Laterality: N/A;   FLEXIBLE SIGMOIDOSCOPY N/A 09/03/2022   Procedure: FLEXIBLE SIGMOIDOSCOPY;  Surgeon: Jeani Hawking, MD;  Location: [facility] ENDOSCOPY;  Service: Gastroenterology;  Laterality: N/A;   HEMOSTASIS CLIP PLACEMENT  03/13/2019   Procedure: HEMOSTASIS CLIP PLACEMENT;  Surgeon: Beverley Fiedler, MD;  Location: [facility] ENDOSCOPY;  Service: Gastroenterology;;   HEMOSTASIS CLIP PLACEMENT  05/23/2022   Procedure: HEMOSTASIS CLIP PLACEMENT;  Surgeon: Jeani Hawking, MD;  Location: [facility] ENDOSCOPY;  Service:  Gastroenterology;;   HEMOSTASIS CONTROL  05/24/2022   Procedure: HEMOSTASIS CONTROL;  Surgeon: Imogene Burn, MD;  Location: [facility] ENDOSCOPY;  Service: Gastroenterology;;   HOT HEMOSTASIS N/A 05/23/2022   Procedure: HOT HEMOSTASIS (ARGON PLASMA COAGULATION/BICAP);  Surgeon: Jeani Hawking, MD;  Location: [name] ENDOSCOPY;  Service: Gastroenterology;  Laterality: N/A;   I & D EXTREMITY Left 05/05/2022   Procedure: IRRIGATION AND DEBRIDEMENT LEFT ARM AV FISTULA;  Surgeon: Cephus Shelling, MD;  Location: [facility] OR;  Service: Vascular;  Laterality: Left;   I & D EXTREMITY N/A 11/14/2022   Procedure: IRRIGATION AND DEBRIDEMENT OF LOWER EXTREMITY WOUND;  Surgeon: Nadara Mustard, MD;  Location: [facility] OR;  Service: Orthopedics;  Laterality: N/A;   INSERTION OF DIALYSIS CATHETER Right 04/02/2022   Procedure: INSERTION OF TUNNELED DIALYSIS CATHETER;  Surgeon: Nada Libman, MD;  Location: [facility] OR;  Service: Vascular;  Laterality: Right;   KNEE ARTHROSCOPY Left 10/25/2006   POLYPECTOMY  03/13/2019   Procedure:  POLYPECTOMY;  Surgeon: Beverley Fiedler, MD;  Location: [facility] ENDOSCOPY;  Service: Gastroenterology;;   REMOVAL OF STONES  02/03/2022   Procedure: REMOVAL OF STONES;  Surgeon: Jeani Hawking, MD;  Location: [facility] ENDOSCOPY;  Service: Gastroenterology;;   REVISON OF ARTERIOVENOUS FISTULA Left 08/20/2022   Procedure: REVISON OF LEFT ARM ARTERIOVENOUS FISTULA;  Surgeon: Nada Libman, MD;  Location: [facility] OR;  Service: Vascular;  Laterality: Left;   RIGHT HEART CATH N/A 07/24/2017   Procedure: RIGHT HEART CATH;  Surgeon: Dolores Patty, MD;  [facility];  Service: Cardiovascular;  Laterality: N/A;   RIGHT HEART CATHETERIZATION N/A 09/22/2013   Procedure: RIGHT HEART CATH;  Surgeon: Dolores Patty, MD;  Location: [facility] CATH LAB;  Service: Cardiovascular;  Laterality: N/A;   SHOULDER ARTHROSCOPY WITH OPEN ROTATOR CUFF REPAIR Right 03/14/2014   Procedure: RIGHT SHOULDER ARTHROSCOPY WITH BICEPS RELEASE,  OPEN SUBSCAPULA REPAIR, OPEN SUPRASPINATUS REPAIR.;  Surgeon: Cammy Copa, MD;  Location: [facility] OR;  Service: Orthopedics;  Laterality: Right;   SPHINCTEROTOMY  02/03/2022   Procedure: SPHINCTEROTOMY;  Surgeon: Jeani Hawking, MD;  Location: [facility] ENDOSCOPY;  Service: Gastroenterology;;   STUMP REVISION Right 06/13/2022   Procedure: REVISION RIGHT BELOW KNEE AMPUTATION;  Surgeon: Nadara Mustard, MD;  Location: [facility] OR;  Service: Orthopedics;  Laterality: Right;   TEE WITHOUT CARDIOVERSION N/A 02/04/2022   Procedure: TRANSESOPHAGEAL ECHOCARDIOGRAM (TEE);  Surgeon: Dolores Patty, MD;  Location: [facility] ENDOSCOPY;  Service: Cardiovascular;  Laterality: N/A;   THROMBECTOMY W/ EMBOLECTOMY Left 08/20/2022   Procedure: THROMBECTOMY OF LEFT ARM ARTERIOVENOUS FISTULA;  Surgeon: Nada Libman, MD;  Location: [facility] OR;  Service: Vascular;  Laterality: Left;   TOE AMPUTATION Right 02/24/2018   GREAT TOE AND 2ND TOE AMPUTATION   TUBAL LIGATION  1970's   Patient Active Problem List   Diagnosis Date Noted   Hyperosmolar hyperglycemic state (HHS) (HCC) 02/05/2023   Depression 01/29/2023   Nausea 01/24/2023   Intractable nausea and vomiting 01/20/2023   Proctitis 01/15/2023   Acute on chronic anemia 11/25/2022   Right below-knee amputee (HCC) 11/20/2022   Cellulitis of left lower extremity 11/14/2022   Maggot infestation 11/12/2022   Complicated wound infection 11/10/2022   Secondary hypercoagulable state (HCC) 09/04/2022   Right sided abdominal pain 08/31/2022   Constipation 06/07/2022   History of Clostridioides difficile colitis 06/06/2022   Below-knee amputation of right lower extremity (HCC) 06/06/2022   Diverticulitis 06/05/2022   Stercoral colitis 06/05/2022   C. difficile colitis 06/05/2022   Spleen hematoma 06/05/2022   Dehiscence of amputation stump of right lower extremity (HCC) 06/05/2022   Rectal ulcer 05/27/2022   ESRD (end stage renal disease) (HCC) 05/27/2022   GI bleed 05/23/2022    Difficult intravenous access 05/23/2022   Gangrene of right foot (HCC) 05/02/2022   S/P BKA (below knee amputation) unilateral, right (HCC) 05/02/2022   Unspecified protein-calorie malnutrition (HCC) 04/15/2022   Secondary hyperparathyroidism of renal origin (HCC) 04/14/2022   Coagulation defect, unspecified (HCC) 04/09/2022   Acquired absence of other left toe(s) (HCC) 04/07/2022   Allergy, unspecified, initial encounter 04/07/2022   Dependence on renal dialysis (HCC) 04/07/2022   Gout due to renal impairment, unspecified site 04/07/2022   Hypertensive heart and chronic kidney disease with heart failure and with stage 5 chronic kidney disease, or end stage renal disease (HCC) 04/07/2022   Personal history of transient ischemic attack (TIA), and cerebral infarction without residual deficits 04/07/2022   Renal osteodystrophy 04/07/2022   Venous stasis  ulcer of right calf (HCC) 03/31/2022   Fistula, colovaginal 03/26/2022   Diarrhea 03/26/2022   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   Sepsis without acute organ dysfunction (HCC)    Bacteremia    Acute pancreatitis 02/01/2022   Abdominal pain 02/01/2022   SIRS (systemic inflammatory response syndrome) (HCC) 02/01/2022   Transaminitis 02/01/2022   History of anemia due to chronic kidney disease 02/01/2022   Paroxysmal atrial fibrillation (HCC) 02/01/2022   Uncontrolled type 2 diabetes mellitus with hyperglycemia, with long-term current use of insulin (HCC) 01/14/2022   NSTEMI (non-ST elevated myocardial infarction) (HCC) 03/05/2021   Acute renal failure superimposed on stage 4 chronic kidney disease (HCC) 08/22/2020   Hypoalbuminemia 05/25/2020   GERD (gastroesophageal reflux disease) 05/25/2020   Pressure injury of skin 05/17/2020   Acute on chronic combined systolic and diastolic congestive heart failure (HCC) 03/07/2020   Type 2 diabetes mellitus with diabetic polyneuropathy, with long-term current use of insulin (HCC) 03/07/2020    Obesity, Class III, BMI 40-49.9 (morbid obesity) (HCC) 03/07/2020   Common bile duct (CBD) obstruction 05/28/2019   Benign neoplasm of ascending colon    Benign neoplasm of transverse colon    Benign neoplasm of descending colon    Benign neoplasm of sigmoid colon    Gastric polyps    Hyperkalemia 03/11/2019   Prolonged QT interval 03/11/2019   Acute blood loss anemia 03/11/2019   Onychomycosis 06/21/2018   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)    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 (HCC)    PVD (peripheral vascular disease) (HCC) 10/26/2017   E-coli UTI 07/27/2017   Hypothyroidism 07/27/2017   AKI (acute kidney injury) (HCC)    PAH (pulmonary artery hypertension) (HCC)    Impaired ambulation 07/19/2017   Nausea & vomiting 07/15/2017   Leg cramps 02/27/2017   Peripheral edema 01/12/2017   Diabetic neuropathy (HCC) 11/12/2016   CKD (chronic kidney disease), stage IV (HCC) 10/24/2015   Anemia of chronic disease 10/03/2015   Historical diagnosis of generalized anxiety disorder 10/03/2015   Secondary insomnia 10/03/2015   Hyperglycemia due to diabetes mellitus (HCC) 06/07/2015   Non compliance with medical treatment 04/17/2014   Rotator cuff tear 03/14/2014   Obesity 09/23/2013   Chronic HFrEF (heart failure with reduced ejection fraction) (HCC) 06/03/2013   Hypotension 12/25/2012   Hyponatremia 12/25/2012   Urinary incontinence    MDD (major depressive disorder), recurrent episode, moderate (HCC) 11/12/2010   RBBB (right bundle branch block)    Wide-complex tachycardia    Coronary artery disease    Hyperlipemia 01/22/2009   Chronic hypotension 01/22/2009    ONSET DATE: 03/09/2023 MD referral to PT  REFERRING DIAG: S88.111D (ICD-10-CM) - Below-knee amputation of right lower extremity, subsequent encounter   THERAPY DIAG:  Unsteadiness on feet  Muscle weakness (generalized)  Phantom limb syndrome with pain (HCC)  Other abnormalities of  gait and mobility  Impaired functional mobility, balance, gait, and endurance  Rationale for Evaluation and Treatment: Rehabilitation  SUBJECTIVE:   SUBJECTIVE STATEMENT: On Dec 2 they are going to put a fistula.    PERTINENT HISTORY: Right TTA 05/02/22, Lymphedema, idiopathic chronic venous HTN with ulcer, depression, colitis, diverticulitis, ESRD, gout, NSTEMI, DM2, polyneuropathy, PVD, CAD, right bundle branch block, mini strokes  PAIN:  Are you having pain? Phantom pain with no known aggravation.   PRECAUTIONS: Fall and Other: No BP LUE  WEIGHT BEARING RESTRICTIONS: No  FALLS: Has patient fallen in last 6 months? No  LIVING ENVIRONMENT:  Lives with: lives with their spouse and 2 small dogs Lives in: mobile home Home Access: Ramped entrance Home layout: One level Stairs: Yes: External: 6-7 steps; can reach both Has following equipment at home: Single point cane, Environmental consultant - 2 wheeled, Environmental consultant - 4 wheeled, Wheelchair (manual), [employer], Grab bars, and Ramped entry  OCCUPATION:  retired  PLOF: prior to amputation for ~year used RW in home & community  PATIENT GOALS:  to use prosthesis to walk, get out w/c in & out of car, get to bathroom.   OBJECTIVE:  COGNITION: Overall cognitive status:  Eval on 03/23/2023:   Within functional limits for tasks assessed  POSTURE:  Eval on 03/23/2023:  rounded shoulders, forward head, increased thoracic kyphosis, flexed trunk , and weight shift left  LOWER EXTREMITY ROM: ROM Right eval Left eval  Hip flexion    Hip extension    Hip abduction    Hip adduction    Hip internal rotation    Hip external rotation    Knee flexion    Knee extension    Ankle dorsiflexion    Ankle plantarflexion    Ankle inversion    Ankle eversion     (Blank rows = not tested)  LOWER EXTREMITY MMT:  MMT Right eval Left eval  Hip flexion 3-/5 3-/5  Hip extension 2/5 2/5  Hip abduction 2+/5 2+/5  Hip adduction    Hip internal rotation    Hip  external rotation    Knee flexion 3-/5 3-/5  Knee extension 3-/5 3-/5  Ankle dorsiflexion    Ankle plantarflexion    Ankle inversion    Ankle eversion    At Evaluation all strength testing is grossly seated and functionally standing / gait. (Blank rows = not tested)  TRANSFERS:  Eval on 03/23/2023:  Sit to stand: Max A (two-person assist) from 22" w/c to rolling walker Stand to sit: Max A (two-person assist) rolling walker to wheelchair  FUNCTIONAL TESTs:   Eval on 03/23/2023: Patient maintained upright holding rolling walker with mod assist 2 people for safety for 30 seconds.  GAIT: Eval on 03/23/2023:  Patient took 2 steps (one per LE) with +2 max assist with rolling walker and TTA prosthesis.  Patient adducting prosthesis stepping on left foot with no awareness.  CURRENT PROSTHETIC WEAR ASSESSMENT:  Eval on 03/23/2023:   Patient is dependent with: skin check, residual limb care, prosthetic cleaning, ply sock cleaning, correct ply sock adjustment, proper wear schedule/adjustment, and proper weight-bearing schedule/adjustment Donning prosthesis: Max A Doffing prosthesis: Min A Prosthetic wear tolerance: 2-6 hours, 1x/day, 3-4 days/week Prosthetic weight bearing tolerance: 3 minutes with limb pain Edema: pitting edema Residual limb condition: No open areas, dry skin, normal color and temperature, cylindrical shape Prosthetic description: Silicone liner with pin lock suspension, total contact socket with flexible inner socket, sach foot    TODAY'S TREATMENT:  DATE:  04/06/2023: Therapeutic Activities: Scooting transfer w/c <> Nustep with modA with cues on technique. Pt appeared to participate more in transfer today.  Single UE red Tband exercises for core stabilization with cues for upright posture with single UE support on lap5 reps 2 sets ea UE: row,  biceps curl and forward reach. Sit to/from stand from 24" bar stool pulling on //bars with modA 1st rep then MinA/CGA and static stance 5 sec 5 reps.  Sit to stand from 21" w/c with maxA and modA to control descent Trunk motions for core stability without UE support but close supervision: 5 reps ea trunk rotation right/left, controlled back lean with sit up with target 5" behind and forward controlled lean with sit up with target 5" anterior   Therapeutic  Exercise: Nustep seat 9 level 5 with BLEs & BUEs 5 min 2 sets with timed 2 min rest bw sets. HR 92 SpO2 99%   04/01/2023: Therapeutic Activities: Scooting transfer w/c <> Nustep with modA with cues on technique.  Sitting on 24" bar stool with feet on floor inside //bars: Single UE red Tband exercises for core stabilization with cues for upright posture with single UE support 5 reps 2 sets ea UE: row, biceps curl and forward reach. Sit to/from stand pulling on //bars with modA and static stance 5 sec 5 reps. Final rep only CGA to stand upright for 5 sec. Sit to stand from 21" w/c with maxA and modA to control descent Trunk motions for core stability without UE support but close supervision: 5 reps ea trunk rotation right/left, controlled back lean with sit up with target 5" behind and forward controlled lean with sit up with target 5" anterior  Therapeutic  Exercise: Nustep seat 9 level 5 with BLEs & BUEs 4 min 2 sets with timed 2 min rest bw sets. HR 92 SpO2 99%   03/30/2023: Prosthetic Training with Transtibial Amputation: PT cued on wear most of awake hours including after dialysis if out of bed. PT demo & verbal cues on patella centered in patella relief area.  Pt & husband verbalized understanding.    Therapeutic Activities: PT demo & verbal cues on sit to/from stand technique. Sit to stand from 21" w/c to sink with RUE on sink & LUE on w/c with maxA.  Stand to sit sink to w/c reaching LUE to w/c with modA. Standing at sink with  pelvic weight shifts right/midline/left with tactile & verbal cues on proprioception with socket / limb interface.  Pt sat on 24" bar stool without UE support with both feet on floor with 3 min with supervision. Sit to stand BUE on sink to bar stool with modA. PT cued on rationale to build trunk stability & endurance in upright position.   Scooting transfer w/c <> Nustep with modA with cues on technique.   Therapeutic  Exercise: Nustep seat 9 level 5 with BLEs & BUEs 3 min 2 sets with timed 2 min rest bw sets. LLE with Theraband sling.  Pt reports she could tell it would help her legs & arms to get stronger.    PATIENT EDUCATION: PATIENT EDUCATED ON FOLLOWING PROSTHETIC CARE: Education details: Use of stockinette under proximal liner to decrease itching sensation, no wounds presents to PT recommended not using Vive wear under liner Skin check, Prosthetic cleaning, Propper donning, and Proper wear schedule/adjustment Prosthetic wear tolerance: 3 hours 2x/day, 4 days/week Person educated: Patient and Spouse Education method: Explanation, Actor cues, and Verbal cues Education comprehension: verbalized  understanding, verbal cues required, tactile cues required, and needs further education  HOME EXERCISE PROGRAM:  ASSESSMENT:  CLINICAL IMPRESSION: Patient required less assistance with transfers and standing activities.  Pt continues to benefit from skilled PT.   OBJECTIVE IMPAIRMENTS: Abnormal gait, decreased activity tolerance, decreased balance, decreased endurance, decreased knowledge of condition, decreased knowledge of use of DME, decreased mobility, difficulty walking, decreased ROM, decreased strength, decreased safety awareness, postural dysfunction, prosthetic dependency , and pain.   ACTIVITY LIMITATIONS: standing, transfers, and locomotion level  PARTICIPATION LIMITATIONS: community activity, household mobility and dependency / burden of care on family  PERSONAL FACTORS: Age,  Fitness, Past/current experiences, Time since onset of injury/illness/exacerbation, and 3+ comorbidities: see PMH  are also affecting patient's functional outcome.   REHAB POTENTIAL: Good  CLINICAL DECISION MAKING: Evolving/moderate complexity  EVALUATION COMPLEXITY: Moderate   GOALS: Goals reviewed with patient? Yes  SHORT TERM GOALS: Target date: 04/22/2023:  Patient donnes prosthesis correctly with husband's assist & verbalizes proper cleaning. Baseline: SEE OBJECTIVE DATA Goal status: Ongoing 03/30/2023 2.  Patient tolerates prosthesis wear on non-dailysis days >8 hrs total /day and on dialysis days >2 hours/day without skin issues or limb pain >5/10. Baseline: SEE OBJECTIVE DATA Goal status: Ongoing 03/30/2023  3.  Patient able to stand with rolling walker with 1 person modA for 1 min.  Baseline: SEE OBJECTIVE DATA Goal status: Ongoing 03/30/2023  4. Patient ambulates 5' with RW & prosthesis with +2 maxA.  Baseline: SEE OBJECTIVE DATA Goal status: Ongoing 03/30/2023  5. Patient sit to/from stand transfer with +1 maxA and scooting transfer with modA. Baseline: SEE OBJECTIVE DATA Goal status: Ongoing 03/30/2023  LONG TERM GOALS: Target date: 06/17/2022  Patient & husband demonstrates & verbalizes understanding of prosthetic care to enable safe utilization of prosthesis. Baseline: SEE OBJECTIVE DATA Goal status: Ongoing 03/30/2023  Patient tolerates prosthesis wear >80% of awake hours on non-dialysis days and >50% on dialysis days without skin issues and limb pain </= 4/10. Baseline: SEE OBJECTIVE DATA Goal status: Ongoing 03/30/2023  Scooting transfers with minA and sit to/from stand transfers with modA.  Baseline: SEE OBJECTIVE DATA Goal status: Ongoing 03/30/2023  Patient ambulates >10' with prosthesis & RW with modA.  Baseline: SEE OBJECTIVE DATA Goal status: Ongoing 03/30/2023  Patient stands with RW support with minA or less for 2 min.  Baseline: SEE OBJECTIVE  DATA Goal status:   Ongoing 03/30/2023   PLAN:  PT FREQUENCY: 2x/week  PT DURATION: 12 weeks  PLANNED INTERVENTIONS: 97164- PT Re-evaluation, 97110-Therapeutic exercises, 97530- Therapeutic activity, 97112- Neuromuscular re-education, [phone]- Self Care, 91478- Gait training, [phone]- Prosthetic training, Patient/Family education, Balance training, DME instructions, Therapeutic exercises, Therapeutic activity, Neuromuscular re-education, Gait training, and Self Care  PLAN FOR NEXT SESSION   continue Nustep.  Core stabilization on bar stool.     Vladimir Faster, PT, DPT 04/06/2023, 10:24 AM

## 2023-04-07 ENCOUNTER — Other Ambulatory Visit: Payer: Self-pay | Admitting: Family Medicine

## 2023-04-07 ENCOUNTER — Telehealth: Payer: Self-pay

## 2023-04-07 DIAGNOSIS — D689 Coagulation defect, unspecified: Secondary | ICD-10-CM | POA: Diagnosis not present

## 2023-04-07 DIAGNOSIS — Z992 Dependence on renal dialysis: Secondary | ICD-10-CM | POA: Diagnosis not present

## 2023-04-07 DIAGNOSIS — N2581 Secondary hyperparathyroidism of renal origin: Secondary | ICD-10-CM | POA: Diagnosis not present

## 2023-04-07 DIAGNOSIS — N186 End stage renal disease: Secondary | ICD-10-CM | POA: Diagnosis not present

## 2023-04-07 NOTE — Telephone Encounter (Signed)
Requested Prescriptions  Pending Prescriptions Disp Refills   venlafaxine XR (EFFEXOR-XR) 75 MG 24 hr capsule [Pharmacy Med Name: VENLAFAXINE HCL ER 75 MG CAP] 90 capsule 0    Sig: TAKE 1 CAPSULE (75 MG TOTAL) BY MOUTH DAILY WITH BREAKFAST. STOP TRINTELLIX     Psychiatry: Antidepressants - SNRI - desvenlafaxine & venlafaxine Failed - 04/06/2023 12:31 PM      Failed - Cr in normal range and within 360 days    Creat  Date Value Ref Range Status  03/26/2022 6.22 (H) 0.60 - 1.00 mg/dL Final   Creatinine, Ser  Date Value Ref Range Status  02/17/2023 4.98 (H) 0.44 - 1.00 mg/dL Final   Creatinine, Urine  Date Value Ref Range Status  08/14/2020 35.12 mg/dL Final    Comment:    Performed at [facility], [address], [place], [place] [address]         Failed - Last BP in normal range    BP Readings from Last 1 Encounters:  04/01/23 (!) 150/73         Failed - Valid encounter within last 6 months    Recent Outpatient Visits           1 year ago Cellulitis of lower extremity, unspecified laterality   [facility] Tanya Nones, Priscille Heidelberg, MD   1 year ago Diarrhea, unspecified type   [facility] [name], Priscille Heidelberg, MD   1 year ago Dysuria   [facility] Donita Brooks, MD   1 year ago Stage 4 chronic kidney disease (HCC)   [facility] Donita Brooks, MD   1 year ago Chronic diastolic CHF (congestive heart failure) (HCC)   [facility] [name], Priscille Heidelberg, MD       Future Appointments             In 3 months Wendall Stade, MD [facility]            Failed - Lipid Panel in normal range within the last 12 months    Cholesterol  Date Value Ref Range Status  02/01/2022 92 0 - 200 mg/dL Final   LDL Cholesterol (Calc)  Date Value Ref Range Status  01/17/2019 65 mg/dL (calc) Final    Comment:    Reference range: <100 . Desirable range <100  mg/dL for primary prevention;   <70 mg/dL for patients with CHD or diabetic patients  with > or = 2 CHD risk factors. Marland Kitchen LDL-C is now calculated using the Martin-Hopkins  calculation, which is a validated novel method providing  better accuracy than the Friedewald equation in the  estimation of LDL-C.  Horald Pollen et al. Lenox Ahr. 0981;191(47): 2061-2068  (http://education.[facility].com/faq/FAQ164)    LDL Cholesterol  Date Value Ref Range Status  02/01/2022 29 0 - 99 mg/dL Final    Comment:           Total Cholesterol/HDL:CHD Risk Coronary Heart Disease Risk Table                     Men   Women  1/2 Average Risk   3.4   3.3  Average Risk       5.0   4.4  2 X Average Risk   9.6   7.1  3 X Average Risk  23.4   11.0        Use the calculated Patient Ratio above  and the CHD Risk Table to determine the patient's CHD Risk.        ATP III CLASSIFICATION (LDL):  <100     mg/dL   Optimal  295-284  mg/dL   Near or Above                    Optimal  130-159  mg/dL   Borderline  132-440  mg/dL   High  >102     mg/dL   Very High Performed at [facility], [address] [place], [place] [address]    HDL  Date Value Ref Range Status  02/01/2022 34 (L) >40 mg/dL Final   Triglycerides  Date Value Ref Range Status  02/01/2022 143 <150 mg/dL Final         Passed - Completed PHQ-2 or PHQ-9 in the last 360 days

## 2023-04-07 NOTE — Telephone Encounter (Signed)
Copied from CRM [id number]. Topic: Clinical - Medication Refill >> Apr 06, 2023  2:35 PM Adelina Mings wrote: Most Recent Primary Care Visit:  Provider: Lynnea Ferrier T  Department: [facility]  Visit Type: HOSPITAL FU  Date: 02/27/2023  Medication: Midodraine 10mg   Has the patient contacted their pharmacy? Yes (Agent: If no, request that the patient contact the pharmacy for the refill. If patient does not wish to contact the pharmacy document the reason why and proceed with request.) (Agent: If yes, when and what did the pharmacy advise?)  Is this the correct pharmacy for this prescription? Yes If no, delete pharmacy and type the correct one.  This is the patient's preferred pharmacy:  [id number] [place], [place] - [address] [place] [address] Phone: [phone] Fax: [phone]   Has the prescription been filled recently? No  Is the patient out of the medication? No  Has the patient been seen for an appointment in the last year OR does the patient have an upcoming appointment? Yes  Can we respond through MyChart? No  Agent: Please be advised that Rx refills may take up to 3 business days. We ask that you follow-up with your pharmacy.

## 2023-04-08 ENCOUNTER — Ambulatory Visit: Payer: PPO | Admitting: Physical Therapy

## 2023-04-08 ENCOUNTER — Encounter: Payer: Self-pay | Admitting: Physical Therapy

## 2023-04-08 ENCOUNTER — Telehealth: Payer: Self-pay

## 2023-04-08 DIAGNOSIS — Z7409 Other reduced mobility: Secondary | ICD-10-CM

## 2023-04-08 DIAGNOSIS — G546 Phantom limb syndrome with pain: Secondary | ICD-10-CM

## 2023-04-08 DIAGNOSIS — R2681 Unsteadiness on feet: Secondary | ICD-10-CM

## 2023-04-08 DIAGNOSIS — R2689 Other abnormalities of gait and mobility: Secondary | ICD-10-CM | POA: Diagnosis not present

## 2023-04-08 DIAGNOSIS — M6281 Muscle weakness (generalized): Secondary | ICD-10-CM | POA: Diagnosis not present

## 2023-04-08 NOTE — Therapy (Signed)
OUTPATIENT PHYSICAL THERAPY PROSTHETIC TREATMENT   Patient Name: Susan [name] MRN: [id number] DOB:Oct 28, 1950, 72 y.o., female Today's Date: 04/08/2023  PCP: Donita Brooks, MD  REFERRING PROVIDER: Nadara Mustard, MD  END OF SESSION:  PT End of Session - 04/08/23 1347     Visit Number 5    Number of Visits 25    Date for PT Re-Evaluation 06/18/23    Authorization Type [employer]    Progress Note Due on Visit 10    PT Start Time 1345    PT Stop Time 1430    PT Time Calculation (min) 45 min    Equipment Utilized During Treatment Gait belt    Activity Tolerance Patient tolerated treatment well;Patient limited by fatigue    Behavior During Therapy WFL for tasks assessed/performed                 Past Medical History:  Diagnosis Date   Anemia    hx   Anxiety    Arthritis    "generalized" (03/15/2014)   CAD (coronary artery disease)    MI in 2000 - MI  2007 - treated bare metal stent (no nuclear since then as 9/11)   Carotid artery disease (HCC)    Chronic diastolic heart failure (HCC)    a) ECHO (08/2013) EF 55-60% and RV function nl b) RHC (08/2013) RA 4, RV 30/5/7, PA 25/10 (16), PCWP 7, Fick CO/CI 6.3/2.7, PVR 1.5 WU, PA 61 and 66%   Daily headache    "~ every other day; since I fell in June" (03/15/2014)   Depression    Diabetic retinopathy (HCC)    Dyslipidemia    ESRD (end stage renal disease) (HCC)    Dialysis on Tues Thurs Sat   Exertional shortness of breath    History of kidney stones    HTN (hypertension)    Hypothyroidism    Obesity    Osteoarthritis    PAF (paroxysmal atrial fibrillation) (HCC)    Peripheral neuropathy    bilateral feet/hands   PONV (postoperative nausea and vomiting)    RBBB (right bundle branch block)    Old   Stroke (HCC)    mini strokes   Type II diabetes mellitus (HCC)    Type II, [name]. patient has omnipod insulin pump with Novolin R Insulin   Past Surgical History:   Procedure Laterality Date   A/V FISTULAGRAM Left 11/07/2022   Procedure: A/V Fistulagram;  Surgeon: Leonie Douglas, MD;  [facility]: [facility];  Service: Cardiovascular;  Laterality: Left;   ABDOMINAL HYSTERECTOMY  1980's   AMPUTATION Right 02/24/2018   Procedure: RIGHT FOOT GREAT TOE AND 2ND TOE AMPUTATION;  Surgeon: Nadara Mustard, MD;  Location: [facility] OR;  Service: Orthopedics;  Laterality: Right;   AMPUTATION Right 04/30/2018   Procedure: RIGHT TRANSMETATARSAL AMPUTATION;  Surgeon: Nadara Mustard, MD;  Location: [facility] OR;  Service: Orthopedics;  Laterality: Right;   AMPUTATION Right 05/02/2022   Procedure: RIGHT BELOW KNEE AMPUTATION;  Surgeon: Nadara Mustard, MD;  Location: [facility] OR;  Service: Orthopedics;  Laterality: Right;   APPLICATION OF WOUND VAC Right 06/13/2022   Procedure: APPLICATION OF WOUND VAC;  Surgeon: Nadara Mustard, MD;  Location: [facility] OR;  Service: Orthopedics;  Laterality: Right;   APPLICATION OF WOUND VAC Left 11/14/2022   Procedure: APPLICATION OF WOUND VAC;  Surgeon: Nadara Mustard, MD;  Location: [facility] OR;  Service: Orthopedics;  Laterality: Left;  AV FISTULA PLACEMENT Left 04/02/2022   Procedure: LEFT ARM ARTERIOVENOUS (AV) FISTULA CREATION;  Surgeon: Nada Libman, MD;  Location: [facility] OR;  Service: Vascular;  Laterality: Left;  PERIPHERAL NERVE BLOCK   BASCILIC VEIN TRANSPOSITION Left 07/31/2022   Procedure: LEFT ARM SECOND STAGE BASILIC VEIN TRANSPOSITION;  Surgeon: Nada Libman, MD;  Location: [facility] OR;  Service: Vascular;  Laterality: Left;   BIOPSY  05/27/2020   Procedure: BIOPSY;  Surgeon: Lanelle Bal, DO;  [facility]: [facility];  Service: Endoscopy;;   CATARACT EXTRACTION, BILATERAL Bilateral ?2013   COLONOSCOPY W/ POLYPECTOMY     COLONOSCOPY WITH PROPOFOL N/A 03/13/2019   Procedure: COLONOSCOPY WITH PROPOFOL;  Surgeon: Beverley Fiedler, MD;  Location: [facility] ENDOSCOPY;  Service: Gastroenterology;  Laterality: N/A;   CORONARY ANGIOPLASTY WITH STENT PLACEMENT  1999;  2007   "1 + 1"   ERCP N/A 02/03/2022   Procedure: ENDOSCOPIC RETROGRADE CHOLANGIOPANCREATOGRAPHY (ERCP);  Surgeon: Jeani Hawking, MD;  Location: [facility] ENDOSCOPY;  Service: Gastroenterology;  Laterality: N/A;   ESOPHAGOGASTRODUODENOSCOPY N/A 02/12/2023   Procedure: ESOPHAGOGASTRODUODENOSCOPY (EGD);  Surgeon: Jeani Hawking, MD;  Location: [facility] ENDOSCOPY;  Service: Gastroenterology;  Laterality: N/A;   ESOPHAGOGASTRODUODENOSCOPY (EGD) WITH PROPOFOL N/A 03/13/2019   Procedure: ESOPHAGOGASTRODUODENOSCOPY (EGD) WITH PROPOFOL;  Surgeon: Beverley Fiedler, MD;  Location: [facility] ENDOSCOPY;  Service: Gastroenterology;  Laterality: N/A;   ESOPHAGOGASTRODUODENOSCOPY (EGD) WITH PROPOFOL N/A 05/27/2020   Procedure: ESOPHAGOGASTRODUODENOSCOPY (EGD) WITH PROPOFOL;  Surgeon: Lanelle Bal, DO;  [facility];  Service: Endoscopy;  Laterality: N/A;   ESOPHAGOGASTRODUODENOSCOPY (EGD) WITH PROPOFOL N/A 09/03/2022   Procedure: ESOPHAGOGASTRODUODENOSCOPY (EGD) WITH PROPOFOL;  Surgeon: Jeani Hawking, MD;  Location: [facility] ENDOSCOPY;  Service: Gastroenterology;  Laterality: N/A;   EYE SURGERY Bilateral    lazer   FLEXIBLE SIGMOIDOSCOPY N/A 05/23/2022   Procedure: FLEXIBLE SIGMOIDOSCOPY;  Surgeon: Jeani Hawking, MD;  Location: [facility] ENDOSCOPY;  Service: Gastroenterology;  Laterality: N/A;   FLEXIBLE SIGMOIDOSCOPY N/A 05/24/2022   Procedure: FLEXIBLE SIGMOIDOSCOPY;  Surgeon: Imogene Burn, MD;  Location: [facility] ENDOSCOPY;  Service: Gastroenterology;  Laterality: N/A;   FLEXIBLE SIGMOIDOSCOPY N/A 09/03/2022   Procedure: FLEXIBLE SIGMOIDOSCOPY;  Surgeon: Jeani Hawking, MD;  Location: [facility] ENDOSCOPY;  Service: Gastroenterology;  Laterality: N/A;   HEMOSTASIS CLIP PLACEMENT  03/13/2019   Procedure: HEMOSTASIS CLIP PLACEMENT;  Surgeon: Beverley Fiedler, MD;  Location: [facility] ENDOSCOPY;  Service: Gastroenterology;;   HEMOSTASIS CLIP PLACEMENT  05/23/2022   Procedure: HEMOSTASIS CLIP PLACEMENT;  Surgeon: Jeani Hawking, MD;  Location: [facility] ENDOSCOPY;   Service: Gastroenterology;;   HEMOSTASIS CONTROL  05/24/2022   Procedure: HEMOSTASIS CONTROL;  Surgeon: Imogene Burn, MD;  Location: [facility] ENDOSCOPY;  Service: Gastroenterology;;   HOT HEMOSTASIS N/A 05/23/2022   Procedure: HOT HEMOSTASIS (ARGON PLASMA COAGULATION/BICAP);  Surgeon: Jeani Hawking, MD;  Location: [facility] ENDOSCOPY;  Service: Gastroenterology;  Laterality: N/A;   I & D EXTREMITY Left 05/05/2022   Procedure: IRRIGATION AND DEBRIDEMENT LEFT ARM AV FISTULA;  Surgeon: Cephus Shelling, MD;  Location: [facility] OR;  Service: Vascular;  Laterality: Left;   I & D EXTREMITY N/A 11/14/2022   Procedure: IRRIGATION AND DEBRIDEMENT OF LOWER EXTREMITY WOUND;  Surgeon: Nadara Mustard, MD;  Location[facility] OR;  Service: Orthopedics;  Laterality: N/A;   INSERTION OF DIALYSIS CATHETER Right 04/02/2022   Procedure: INSERTION OF TUNNELED DIALYSIS CATHETER;  Surgeon: Nada Libman, MD;  Location: [facility] OR;  Service: Vascular;  Laterality: Right;   KNEE ARTHROSCOPY Left 10/25/2006   POLYPECTOMY  03/13/2019  Procedure: POLYPECTOMY;  Surgeon: Beverley Fiedler, MD;  Location: [facility] ENDOSCOPY;  Service: Gastroenterology;;   REMOVAL OF STONES  02/03/2022   Procedure: REMOVAL OF STONES;  Surgeon: Jeani Hawking, MD;  Location: [facility] ENDOSCOPY;  Service: Gastroenterology;;   REVISON OF ARTERIOVENOUS FISTULA Left 08/20/2022   Procedure: REVISON OF LEFT ARM ARTERIOVENOUS FISTULA;  Surgeon: Nada Libman, MD;  [facility] OR;  Service: Vascular;  Laterality: Left;   RIGHT HEART CATH N/A 07/24/2017   Procedure: RIGHT HEART CATH;  Surgeon: Dolores Patty, MD;  [facility];  Service: Cardiovascular;  Laterality: N/A;   RIGHT HEART CATHETERIZATION N/A 09/22/2013   Procedure: RIGHT HEART CATH;  Surgeon: Dolores Patty, MD;  Location: [facility] CATH LAB;  Service: Cardiovascular;  Laterality: N/A;   SHOULDER ARTHROSCOPY WITH OPEN ROTATOR CUFF REPAIR Right 03/14/2014   Procedure: RIGHT SHOULDER ARTHROSCOPY WITH BICEPS  RELEASE, OPEN SUBSCAPULA REPAIR, OPEN SUPRASPINATUS REPAIR.;  Surgeon: Cammy Copa, MD;  Location: [facility] OR;  Service: Orthopedics;  Laterality: Right;   SPHINCTEROTOMY  02/03/2022   Procedure: SPHINCTEROTOMY;  Surgeon: Jeani Hawking, MD;  Location: [facility] ENDOSCOPY;  Service: Gastroenterology;;   STUMP REVISION Right 06/13/2022   Procedure: REVISION RIGHT BELOW KNEE AMPUTATION;  Surgeon: Nadara Mustard, MD;  Location: [facility] OR;  Service: Orthopedics;  Laterality: Right;   TEE WITHOUT CARDIOVERSION N/A 02/04/2022   Procedure: TRANSESOPHAGEAL ECHOCARDIOGRAM (TEE);  Surgeon: Dolores Patty, MD;  Location: [facility] ENDOSCOPY;  Service: Cardiovascular;  Laterality: N/A;   THROMBECTOMY W/ EMBOLECTOMY Left 08/20/2022   Procedure: THROMBECTOMY OF LEFT ARM ARTERIOVENOUS FISTULA;  Surgeon: Nada Libman, MD;  Location: [facility] OR;  Service: Vascular;  Laterality: Left;   TOE AMPUTATION Right 02/24/2018   GREAT TOE AND 2ND TOE AMPUTATION   TUBAL LIGATION  1970's   Patient Active Problem List   Diagnosis Date Noted   Hyperosmolar hyperglycemic state (HHS) (HCC) 02/05/2023   Depression 01/29/2023   Nausea 01/24/2023   Intractable nausea and vomiting 01/20/2023   Proctitis 01/15/2023   Acute on chronic anemia 11/25/2022   Right below-knee amputee (HCC) 11/20/2022   Cellulitis of left lower extremity 11/14/2022   Maggot infestation 11/12/2022   Complicated wound infection 11/10/2022   Secondary hypercoagulable state (HCC) 09/04/2022   Right sided abdominal pain 08/31/2022   Constipation 06/07/2022   History of Clostridioides difficile colitis 06/06/2022   Below-knee amputation of right lower extremity (HCC) 06/06/2022   Diverticulitis 06/05/2022   Stercoral colitis 06/05/2022   C. difficile colitis 06/05/2022   Spleen hematoma 06/05/2022   Dehiscence of amputation stump of right lower extremity (HCC) 06/05/2022   Rectal ulcer 05/27/2022   ESRD (end stage renal disease) (HCC) 05/27/2022   GI bleed  05/23/2022   Difficult intravenous access 05/23/2022   Gangrene of right foot (HCC) 05/02/2022   S/P BKA (below knee amputation) unilateral, right (HCC) 05/02/2022   Unspecified protein-calorie malnutrition (HCC) 04/15/2022   Secondary hyperparathyroidism of renal origin (HCC) 04/14/2022   Coagulation defect, unspecified (HCC) 04/09/2022   Acquired absence of other left toe(s) (HCC) 04/07/2022   Allergy, unspecified, initial encounter 04/07/2022   Dependence on renal dialysis (HCC) 04/07/2022   Gout due to renal impairment, unspecified site 04/07/2022   Hypertensive heart and chronic kidney disease with heart failure and with stage 5 chronic kidney disease, or end stage renal disease (HCC) 04/07/2022   Personal history of transient ischemic attack (TIA), and cerebral infarction without residual deficits 04/07/2022   Renal osteodystrophy 04/07/2022   Venous  stasis ulcer of right calf (HCC) 03/31/2022   Fistula, colovaginal 03/26/2022   Diarrhea 03/26/2022   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   Sepsis without acute organ dysfunction (HCC)    Bacteremia    Acute pancreatitis 02/01/2022   Abdominal pain 02/01/2022   SIRS (systemic inflammatory response syndrome) (HCC) 02/01/2022   Transaminitis 02/01/2022   History of anemia due to chronic kidney disease 02/01/2022   Paroxysmal atrial fibrillation (HCC) 02/01/2022   Uncontrolled type 2 diabetes mellitus with hyperglycemia, with long-term current use of insulin (HCC) 01/14/2022   NSTEMI (non-ST elevated myocardial infarction) (HCC) 03/05/2021   Acute renal failure superimposed on stage 4 chronic kidney disease (HCC) 08/22/2020   Hypoalbuminemia 05/25/2020   GERD (gastroesophageal reflux disease) 05/25/2020   Pressure injury of skin 05/17/2020   Acute on chronic combined systolic and diastolic congestive heart failure (HCC) 03/07/2020   Type 2 diabetes mellitus with diabetic polyneuropathy, with long-term current use of insulin (HCC)  03/07/2020   Obesity, Class III, BMI 40-49.9 (morbid obesity) (HCC) 03/07/2020   Common bile duct (CBD) obstruction 05/28/2019   Benign neoplasm of ascending colon    Benign neoplasm of transverse colon    Benign neoplasm of descending colon    Benign neoplasm of sigmoid colon    Gastric polyps    Hyperkalemia 03/11/2019   Prolonged QT interval 03/11/2019   Acute blood loss anemia 03/11/2019   Onychomycosis 06/21/2018   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)    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 (HCC)    PVD (peripheral vascular disease) (HCC) 10/26/2017   E-coli UTI 07/27/2017   Hypothyroidism 07/27/2017   AKI (acute kidney injury) (HCC)    PAH (pulmonary artery hypertension) (HCC)    Impaired ambulation 07/19/2017   Nausea & vomiting 07/15/2017   Leg cramps 02/27/2017   Peripheral edema 01/12/2017   Diabetic neuropathy (HCC) 11/12/2016   CKD (chronic kidney disease), stage IV (HCC) 10/24/2015   Anemia of chronic disease 10/03/2015   Historical diagnosis of generalized anxiety disorder 10/03/2015   Secondary insomnia 10/03/2015   Hyperglycemia due to diabetes mellitus (HCC) 06/07/2015   Non compliance with medical treatment 04/17/2014   Rotator cuff tear 03/14/2014   Obesity 09/23/2013   Chronic HFrEF (heart failure with reduced ejection fraction) (HCC) 06/03/2013   Hypotension 12/25/2012   Hyponatremia 12/25/2012   Urinary incontinence    MDD (major depressive disorder), recurrent episode, moderate (HCC) 11/12/2010   RBBB (right bundle branch block)    Wide-complex tachycardia    Coronary artery disease    Hyperlipemia 01/22/2009   Chronic hypotension 01/22/2009    ONSET DATE: 03/09/2023 MD referral to PT  REFERRING DIAG: S88.111D (ICD-10-CM) - Below-knee amputation of right lower extremity, subsequent encounter   THERAPY DIAG:  Unsteadiness on feet  Muscle weakness (generalized)  Phantom limb syndrome with pain (HCC)  Other  abnormalities of gait and mobility  Impaired functional mobility, balance, gait, and endurance  Rationale for Evaluation and Treatment: Rehabilitation  SUBJECTIVE:   SUBJECTIVE STATEMENT: She continues to have some muscle soreness with activities but is slowly decreasing.   PERTINENT HISTORY: Right TTA 05/02/22, Lymphedema, idiopathic chronic venous HTN with ulcer, depression, colitis, diverticulitis, ESRD, gout, NSTEMI, DM2, polyneuropathy, PVD, CAD, right bundle branch block, mini strokes  PAIN:  Are you having pain? Phantom pain with no known aggravation.   PRECAUTIONS: Fall and Other: No BP LUE  WEIGHT BEARING RESTRICTIONS: No  FALLS: Has patient fallen in last 6 months? No  LIVING ENVIRONMENT: Lives with: lives with their spouse and 2 small dogs Lives in: mobile home Home Access: Ramped entrance Home layout: One level Stairs: Yes: External: 6-7 steps; can reach both Has following equipment at home: Single point cane, Environmental consultant - 2 wheeled, Environmental consultant - 4 wheeled, Wheelchair (manual), [employer], Grab bars, and Ramped entry  OCCUPATION:  retired  PLOF: prior to amputation for ~year used RW in home & community  PATIENT GOALS:  to use prosthesis to walk, get out w/c in & out of car, get to bathroom.   OBJECTIVE:  COGNITION: Overall cognitive status:  Eval on 03/23/2023:   Within functional limits for tasks assessed  POSTURE:  Eval on 03/23/2023:  rounded shoulders, forward head, increased thoracic kyphosis, flexed trunk , and weight shift left  LOWER EXTREMITY ROM: ROM Right eval Left eval  Hip flexion    Hip extension    Hip abduction    Hip adduction    Hip internal rotation    Hip external rotation    Knee flexion    Knee extension    Ankle dorsiflexion    Ankle plantarflexion    Ankle inversion    Ankle eversion     (Blank rows = not tested)  LOWER EXTREMITY MMT:  MMT Right eval Left eval  Hip flexion 3-/5 3-/5  Hip extension 2/5 2/5  Hip abduction  2+/5 2+/5  Hip adduction    Hip internal rotation    Hip external rotation    Knee flexion 3-/5 3-/5  Knee extension 3-/5 3-/5  Ankle dorsiflexion    Ankle plantarflexion    Ankle inversion    Ankle eversion    At Evaluation all strength testing is grossly seated and functionally standing / gait. (Blank rows = not tested)  TRANSFERS:  Eval on 03/23/2023:  Sit to stand: Max A (two-person assist) from 22" w/c to rolling walker Stand to sit: Max A (two-person assist) rolling walker to wheelchair  FUNCTIONAL TESTs:   Eval on 03/23/2023: Patient maintained upright holding rolling walker with mod assist 2 people for safety for 30 seconds.  GAIT: Eval on 03/23/2023:  Patient took 2 steps (one per LE) with +2 max assist with rolling walker and TTA prosthesis.  Patient adducting prosthesis stepping on left foot with no awareness.  CURRENT PROSTHETIC WEAR ASSESSMENT:  Eval on 03/23/2023:   Patient is dependent with: skin check, residual limb care, prosthetic cleaning, ply sock cleaning, correct ply sock adjustment, proper wear schedule/adjustment, and proper weight-bearing schedule/adjustment Donning prosthesis: Max A Doffing prosthesis: Min A Prosthetic wear tolerance: 2-6 hours, 1x/day, 3-4 days/week Prosthetic weight bearing tolerance: 3 minutes with limb pain Edema: pitting edema Residual limb condition: No open areas, dry skin, normal color and temperature, cylindrical shape Prosthetic description: Silicone liner with pin lock suspension, total contact socket with flexible inner socket, sach foot    TODAY'S TREATMENT:  DATE:  04/08/2023: Therapeutic Activities: Scooting transfer w/c <> Nustep with modA with cues on technique. Pt appeared to participate more in transfer today.  PT demo and verbal cues on transfers to right and left scooting including set up  between the 2 chairs.  PT recommending to transfer to bedside commode.  Patient and husband verbalized understanding of appropriate transfer. PT demo and verbal cues on scooting forward by walking her hips to the edge of the chair.  Patient returned demonstration 3 repetitions.  Moving back in wheelchair by lifting buttocks with proper technique.  Patient returned demonstration 3 repetitions. PT demo and verbal cues on using head on transfer wheelchair to toilet by scooting forward with front edge of wheelchair touching the front of the toilet and cues on managing pants prior to performing transfer.  Patient able to to perform with minA.  Then transferred back to the wheelchair scooting back with modA.  PT explained this might be a safer transfer her if she needs to toilet while out in public.  Patient is still able to toilet facing backwards as she was as long as her buttocks are over the water.  Patient and husband verbalized understanding and reports this would be very helpful. Sitting to partial stand pushing with bilateral lower extremities and bilateral upper extremities and have to lift buttocks off the wheelchair with weight on BLEs.  Patient performed 5 reps with minA and constant cueing   Therapeutic  Exercise: Nustep seat 9 level 5 with BLEs & BUEs 5 min 2 sets with timed 2 min rest bw sets. HR 92 SpO2 99%    04/06/2023: Therapeutic Activities: Scooting transfer w/c <> Nustep with modA with cues on technique. Pt appeared to participate more in transfer today.  Single UE red Tband exercises for core stabilization with cues for upright posture with single UE support on lap5 reps 2 sets ea UE: row, biceps curl and forward reach. Sit to/from stand from 24" bar stool pulling on //bars with modA 1st rep then MinA/CGA and static stance 5 sec 5 reps.  Sit to stand from 21" w/c with maxA and modA to control descent Trunk motions for core stability without UE support but close supervision: 5 reps  ea trunk rotation right/left, controlled back lean with sit up with target 5" behind and forward controlled lean with sit up with target 5" anterior   Therapeutic  Exercise: Nustep seat 9 level 5 with BLEs & BUEs 5 min 2 sets with timed 2 min rest bw sets. HR 92 SpO2 99%   04/01/2023: Therapeutic Activities: Scooting transfer w/c <> Nustep with modA with cues on technique.  Sitting on 24" bar stool with feet on floor inside //bars: Single UE red Tband exercises for core stabilization with cues for upright posture with single UE support 5 reps 2 sets ea UE: row, biceps curl and forward reach. Sit to/from stand pulling on //bars with modA and static stance 5 sec 5 reps. Final rep only CGA to stand upright for 5 sec. Sit to stand from 21" w/c with maxA and modA to control descent Trunk motions for core stability without UE support but close supervision: 5 reps ea trunk rotation right/left, controlled back lean with sit up with target 5" behind and forward controlled lean with sit up with target 5" anterior  Therapeutic  Exercise: Nustep seat 9 level 5 with BLEs & BUEs 4 min 2 sets with timed 2 min rest bw sets. HR 92 SpO2 99%   PATIENT  EDUCATION: PATIENT EDUCATED ON FOLLOWING PROSTHETIC CARE: Education details: Use of stockinette under proximal liner to decrease itching sensation, no wounds presents to PT recommended not using Vive wear under liner Skin check, Prosthetic cleaning, Propper donning, and Proper wear schedule/adjustment Prosthetic wear tolerance: 3 hours 2x/day, 4 days/week Person educated: Patient and Spouse Education method: Explanation, Tactile cues, and Verbal cues Education comprehension: verbalized understanding, verbal cues required, tactile cues required, and needs further education  HOME EXERCISE PROGRAM:  ASSESSMENT:  CLINICAL IMPRESSION: PT addressed issues with using toilet which pt & her husband verbalized understanding and feel will help.    Pt continues to  benefit from skilled PT.   OBJECTIVE IMPAIRMENTS: Abnormal gait, decreased activity tolerance, decreased balance, decreased endurance, decreased knowledge of condition, decreased knowledge of use of DME, decreased mobility, difficulty walking, decreased ROM, decreased strength, decreased safety awareness, postural dysfunction, prosthetic dependency , and pain.   ACTIVITY LIMITATIONS: standing, transfers, and locomotion level  PARTICIPATION LIMITATIONS: community activity, household mobility and dependency / burden of care on family  PERSONAL FACTORS: Age, Fitness, Past/current experiences, Time since onset of injury/illness/exacerbation, and 3+ comorbidities: see PMH  are also affecting patient's functional outcome.   REHAB POTENTIAL: Good  CLINICAL DECISION MAKING: Evolving/moderate complexity  EVALUATION COMPLEXITY: Moderate   GOALS: Goals reviewed with patient? Yes  SHORT TERM GOALS: Target date: 04/22/2023:  Patient donnes prosthesis correctly with husband's assist & verbalizes proper cleaning. Baseline: SEE OBJECTIVE DATA Goal status: Ongoing 03/30/2023 2.  Patient tolerates prosthesis wear on non-dailysis days >8 hrs total /day and on dialysis days >2 hours/day without skin issues or limb pain >5/10. Baseline: SEE OBJECTIVE DATA Goal status: Ongoing 03/30/2023  3.  Patient able to stand with rolling walker with 1 person modA for 1 min.  Baseline: SEE OBJECTIVE DATA Goal status: Ongoing 03/30/2023  4. Patient ambulates 5' with RW & prosthesis with +2 maxA.  Baseline: SEE OBJECTIVE DATA Goal status: Ongoing 03/30/2023  5. Patient sit to/from stand transfer with +1 maxA and scooting transfer with modA. Baseline: SEE OBJECTIVE DATA Goal status: Ongoing 03/30/2023  LONG TERM GOALS: Target date: 06/17/2022  Patient & husband demonstrates & verbalizes understanding of prosthetic care to enable safe utilization of prosthesis. Baseline: SEE OBJECTIVE DATA Goal status: Ongoing  03/30/2023  Patient tolerates prosthesis wear >80% of awake hours on non-dialysis days and >50% on dialysis days without skin issues and limb pain </= 4/10. Baseline: SEE OBJECTIVE DATA Goal status: Ongoing 03/30/2023  Scooting transfers with minA and sit to/from stand transfers with modA.  Baseline: SEE OBJECTIVE DATA Goal status: Ongoing 03/30/2023  Patient ambulates >10' with prosthesis & RW with modA.  Baseline: SEE OBJECTIVE DATA Goal status: Ongoing 03/30/2023  Patient stands with RW support with minA or less for 2 min.  Baseline: SEE OBJECTIVE DATA Goal status:   Ongoing 03/30/2023   PLAN:  PT FREQUENCY: 2x/week  PT DURATION: 12 weeks  PLANNED INTERVENTIONS: 97164- PT Re-evaluation, 97110-Therapeutic exercises, 97530- Therapeutic activity, 97112- Neuromuscular re-education, [phone]- Self Care, 19147- Gait training, [phone]- Prosthetic training, Patient/Family education, Balance training, DME instructions, Therapeutic exercises, Therapeutic activity, Neuromuscular re-education, Gait training, and Self Care  PLAN FOR NEXT SESSION     continue Nustep.  Core stabilization on bar stool.     Vladimir Faster, PT, DPT 04/08/2023, 3:51 PM

## 2023-04-08 NOTE — Telephone Encounter (Signed)
Copied from CRM [id number]. Topic: General - Other >> Apr 08, 2023  9:02 AM Roswell Nickel wrote: Reason for CRM: Patient called  regarding [employer] call her to let know she needs transcription oxygen tank,she asks for script be  Fax  to them at [phone].

## 2023-04-10 ENCOUNTER — Telehealth: Payer: Self-pay

## 2023-04-10 ENCOUNTER — Other Ambulatory Visit: Payer: Self-pay

## 2023-04-10 DIAGNOSIS — N184 Chronic kidney disease, stage 4 (severe): Secondary | ICD-10-CM

## 2023-04-10 DIAGNOSIS — I10 Essential (primary) hypertension: Secondary | ICD-10-CM

## 2023-04-10 DIAGNOSIS — R9431 Abnormal electrocardiogram [ECG] [EKG]: Secondary | ICD-10-CM

## 2023-04-10 DIAGNOSIS — N186 End stage renal disease: Secondary | ICD-10-CM

## 2023-04-10 MED ORDER — MIDODRINE HCL 10 MG PO TABS: 10.0000 mg | ORAL_TABLET | Freq: Every day | ORAL | 3 refills | Status: DC

## 2023-04-10 NOTE — Telephone Encounter (Signed)
Do you want her to continue the Midodrine? Thank you.  Copied from CRM [id number]. Topic: Clinical - Medical Advice >> Apr 10, 2023  2:30 PM Judeth Cornfield R wrote: Reason for CRM: PT states that she takes midodrine while she is taking her dialysis but she states Dr. Tanya Nones says she is not on midodrine- dialysis treatment center was worried about her not having it. Please provide the PT clarification on this.

## 2023-04-13 ENCOUNTER — Ambulatory Visit: Payer: PPO | Admitting: Physical Therapy

## 2023-04-13 ENCOUNTER — Encounter (HOSPITAL_COMMUNITY): Payer: Self-pay

## 2023-04-13 ENCOUNTER — Ambulatory Visit (HOSPITAL_COMMUNITY)
Admission: RE | Admit: 2023-04-13 | Discharge: 2023-04-13 | Disposition: A | Discharge status: Home / Self Care | Payer: PPO | Source: Ambulatory Visit | Attending: Adult Health | Admitting: Adult Health

## 2023-04-13 ENCOUNTER — Encounter: Payer: Self-pay | Admitting: Physical Therapy

## 2023-04-13 VITALS — BP 102/52 | HR 72 | Wt 232.0 lb

## 2023-04-13 DIAGNOSIS — R2689 Other abnormalities of gait and mobility: Secondary | ICD-10-CM | POA: Diagnosis not present

## 2023-04-13 DIAGNOSIS — G546 Phantom limb syndrome with pain: Secondary | ICD-10-CM

## 2023-04-13 DIAGNOSIS — I480 Paroxysmal atrial fibrillation: Secondary | ICD-10-CM | POA: Diagnosis not present

## 2023-04-13 DIAGNOSIS — I5082 Biventricular heart failure: Secondary | ICD-10-CM | POA: Diagnosis not present

## 2023-04-13 DIAGNOSIS — M6281 Muscle weakness (generalized): Secondary | ICD-10-CM

## 2023-04-13 DIAGNOSIS — E1122 Type 2 diabetes mellitus with diabetic chronic kidney disease: Secondary | ICD-10-CM

## 2023-04-13 DIAGNOSIS — Z992 Dependence on renal dialysis: Secondary | ICD-10-CM | POA: Diagnosis not present

## 2023-04-13 DIAGNOSIS — R2681 Unsteadiness on feet: Secondary | ICD-10-CM

## 2023-04-13 DIAGNOSIS — I5022 Chronic systolic (congestive) heart failure: Secondary | ICD-10-CM

## 2023-04-13 DIAGNOSIS — Z79890 Hormone replacement therapy (postmenopausal): Secondary | ICD-10-CM | POA: Diagnosis not present

## 2023-04-13 DIAGNOSIS — I132 Hypertensive heart and chronic kidney disease with heart failure and with stage 5 chronic kidney disease, or end stage renal disease: Secondary | ICD-10-CM | POA: Insufficient documentation

## 2023-04-13 DIAGNOSIS — E1165 Type 2 diabetes mellitus with hyperglycemia: Secondary | ICD-10-CM | POA: Diagnosis not present

## 2023-04-13 DIAGNOSIS — E1140 Type 2 diabetes mellitus with diabetic neuropathy, unspecified: Secondary | ICD-10-CM | POA: Diagnosis not present

## 2023-04-13 DIAGNOSIS — I4510 Unspecified right bundle-branch block: Secondary | ICD-10-CM | POA: Insufficient documentation

## 2023-04-13 DIAGNOSIS — Z87891 Personal history of nicotine dependence: Secondary | ICD-10-CM | POA: Insufficient documentation

## 2023-04-13 DIAGNOSIS — R9431 Abnormal electrocardiogram [ECG] [EKG]: Secondary | ICD-10-CM | POA: Insufficient documentation

## 2023-04-13 DIAGNOSIS — I252 Old myocardial infarction: Secondary | ICD-10-CM | POA: Insufficient documentation

## 2023-04-13 DIAGNOSIS — Z794 Long term (current) use of insulin: Secondary | ICD-10-CM | POA: Diagnosis not present

## 2023-04-13 DIAGNOSIS — Z7901 Long term (current) use of anticoagulants: Secondary | ICD-10-CM | POA: Insufficient documentation

## 2023-04-13 DIAGNOSIS — N186 End stage renal disease: Secondary | ICD-10-CM

## 2023-04-13 DIAGNOSIS — D631 Anemia in chronic kidney disease: Secondary | ICD-10-CM

## 2023-04-13 DIAGNOSIS — A0472 Enterocolitis due to Clostridium difficile, not specified as recurrent: Secondary | ICD-10-CM

## 2023-04-13 DIAGNOSIS — K851 Biliary acute pancreatitis without necrosis or infection: Secondary | ICD-10-CM | POA: Diagnosis not present

## 2023-04-13 DIAGNOSIS — Z89511 Acquired absence of right leg below knee: Secondary | ICD-10-CM

## 2023-04-13 DIAGNOSIS — Z7409 Other reduced mobility: Secondary | ICD-10-CM

## 2023-04-13 DIAGNOSIS — N184 Chronic kidney disease, stage 4 (severe): Secondary | ICD-10-CM | POA: Diagnosis not present

## 2023-04-13 DIAGNOSIS — I5032 Chronic diastolic (congestive) heart failure: Secondary | ICD-10-CM | POA: Diagnosis not present

## 2023-04-13 DIAGNOSIS — Z79899 Other long term (current) drug therapy: Secondary | ICD-10-CM | POA: Insufficient documentation

## 2023-04-13 DIAGNOSIS — Z6838 Body mass index (BMI) 38.0-38.9, adult: Secondary | ICD-10-CM | POA: Insufficient documentation

## 2023-04-13 DIAGNOSIS — I2510 Atherosclerotic heart disease of native coronary artery without angina pectoris: Secondary | ICD-10-CM

## 2023-04-13 DIAGNOSIS — E039 Hypothyroidism, unspecified: Secondary | ICD-10-CM | POA: Insufficient documentation

## 2023-04-13 DIAGNOSIS — Z8719 Personal history of other diseases of the digestive system: Secondary | ICD-10-CM | POA: Diagnosis not present

## 2023-04-13 NOTE — Therapy (Unsigned)
OUTPATIENT PHYSICAL THERAPY PROSTHETIC TREATMENT   Patient Name: Susan [name] MRN: [id number] DOB:1951-01-28, 72 y.o., female Today's Date: 04/13/2023  PCP: Donita Brooks, MD  REFERRING PROVIDER: Nadara Mustard, MD  END OF SESSION:  PT End of Session - 04/13/23 1352     Visit Number 6    Number of Visits 25    Date for PT Re-Evaluation 06/18/23    Authorization Type [employer]    Progress Note Due on Visit 10    PT Start Time 1345    PT Stop Time 1431    PT Time Calculation (min) 46 min    Equipment Utilized During Treatment Gait belt    Activity Tolerance Patient tolerated treatment well;Patient limited by fatigue    Behavior During Therapy WFL for tasks assessed/performed                  Past Medical History:  Diagnosis Date   Anemia    hx   Anxiety    Arthritis    "generalized" (03/15/2014)   CAD (coronary artery disease)    MI in 2000 - MI  2007 - treated bare metal stent (no nuclear since then as 9/11)   Carotid artery disease (HCC)    Chronic diastolic heart failure (HCC)    a) ECHO (08/2013) EF 55-60% and RV function nl b) RHC (08/2013) RA 4, RV 30/5/7, PA 25/10 (16), PCWP 7, Fick CO/CI 6.3/2.7, PVR 1.5 WU, PA 61 and 66%   Daily headache    "~ every other day; since I fell in June" (03/15/2014)   Depression    Diabetic retinopathy (HCC)    Dyslipidemia    ESRD (end stage renal disease) (HCC)    Dialysis on Tues Thurs Sat   Exertional shortness of breath    History of kidney stones    HTN (hypertension)    Hypothyroidism    Obesity    Osteoarthritis    PAF (paroxysmal atrial fibrillation) (HCC)    Peripheral neuropathy    bilateral feet/hands   PONV (postoperative nausea and vomiting)    RBBB (right bundle branch block)    Old   Stroke (HCC)    mini strokes   Type II diabetes mellitus (HCC)    Type II, [name]. patient has omnipod insulin pump with Novolin R Insulin   Past Surgical History:   Procedure Laterality Date   A/V FISTULAGRAM Left 11/07/2022   Procedure: A/V Fistulagram;  Surgeon: Leonie Douglas, MD;  [facility]: [facility];  Service: Cardiovascular;  Laterality: Left;   ABDOMINAL HYSTERECTOMY  1980's   AMPUTATION Right 02/24/2018   Procedure: RIGHT FOOT GREAT TOE AND 2ND TOE AMPUTATION;  Surgeon: Nadara Mustard, MD;  Location: [facility] OR;  Service: Orthopedics;  Laterality: Right;   AMPUTATION Right 04/30/2018   Procedure: RIGHT TRANSMETATARSAL AMPUTATION;  Surgeon: Nadara Mustard, MD;  Location: [facility] OR;  Service: Orthopedics;  Laterality: Right;   AMPUTATION Right 05/02/2022   Procedure: RIGHT BELOW KNEE AMPUTATION;  Surgeon: Nadara Mustard, MD;  Location: [facility] OR;  Service: Orthopedics;  Laterality: Right;   APPLICATION OF WOUND VAC Right 06/13/2022   Procedure: APPLICATION OF WOUND VAC;  Surgeon: Nadara Mustard, MD;  Location: [facility] OR;  Service: Orthopedics;  Laterality: Right;   APPLICATION OF WOUND VAC Left 11/14/2022   Procedure: APPLICATION OF WOUND VAC;  Surgeon: Nadara Mustard, MD;  Location: [facility] OR;  Service: Orthopedics;  Laterality: Left;  AV FISTULA PLACEMENT Left 04/02/2022   Procedure: LEFT ARM ARTERIOVENOUS (AV) FISTULA CREATION;  Surgeon: Nada Libman, MD;  Location: [facility] OR;  Service: Vascular;  Laterality: Left;  PERIPHERAL NERVE BLOCK   BASCILIC VEIN TRANSPOSITION Left 07/31/2022   Procedure: LEFT ARM SECOND STAGE BASILIC VEIN TRANSPOSITION;  Surgeon: Nada Libman, MD;  Location: [facility] OR;  Service: Vascular;  Laterality: Left;   BIOPSY  05/27/2020   Procedure: BIOPSY;  Surgeon: Lanelle Bal, DO;  Location: [facility];  Service: Endoscopy;;   CATARACT EXTRACTION, BILATERAL Bilateral ?2013   COLONOSCOPY W/ POLYPECTOMY     COLONOSCOPY WITH PROPOFOL N/A 03/13/2019   Procedure: COLONOSCOPY WITH PROPOFOL;  Surgeon: Beverley Fiedler, MD;  Location: [facility] ENDOSCOPY;  Service: Gastroenterology;  Laterality: N/A;   CORONARY ANGIOPLASTY WITH STENT PLACEMENT  1999;  2007   "1 + 1"   ERCP N/A 02/03/2022   Procedure: ENDOSCOPIC RETROGRADE CHOLANGIOPANCREATOGRAPHY (ERCP);  Surgeon: Jeani Hawking, MD;  Location: [facility] ENDOSCOPY;  Service: Gastroenterology;  Laterality: N/A;   ESOPHAGOGASTRODUODENOSCOPY N/A 02/12/2023   Procedure: ESOPHAGOGASTRODUODENOSCOPY (EGD);  Surgeon: Jeani Hawking, MD;  Location: [facility] ENDOSCOPY;  Service: Gastroenterology;  Laterality: N/A;   ESOPHAGOGASTRODUODENOSCOPY (EGD) WITH PROPOFOL N/A 03/13/2019   Procedure: ESOPHAGOGASTRODUODENOSCOPY (EGD) WITH PROPOFOL;  Surgeon: Beverley Fiedler, MD;  Location: [facility] ENDOSCOPY;  Service: Gastroenterology;  Laterality: N/A;   ESOPHAGOGASTRODUODENOSCOPY (EGD) WITH PROPOFOL N/A 05/27/2020   Procedure: ESOPHAGOGASTRODUODENOSCOPY (EGD) WITH PROPOFOL;  Surgeon: Lanelle Bal, DO;  [facility];  Service: Endoscopy;  Laterality: N/A;   ESOPHAGOGASTRODUODENOSCOPY (EGD) WITH PROPOFOL N/A 09/03/2022   Procedure: ESOPHAGOGASTRODUODENOSCOPY (EGD) WITH PROPOFOL;  Surgeon: Jeani Hawking, MD;  Location: [facility] ENDOSCOPY;  Service: Gastroenterology;  Laterality: N/A;   EYE SURGERY Bilateral    lazer   FLEXIBLE SIGMOIDOSCOPY N/A 05/23/2022   Procedure: FLEXIBLE SIGMOIDOSCOPY;  Surgeon: Jeani Hawking, MD;  Location: [facility] ENDOSCOPY;  Service: Gastroenterology;  Laterality: N/A;   FLEXIBLE SIGMOIDOSCOPY N/A 05/24/2022   Procedure: FLEXIBLE SIGMOIDOSCOPY;  Surgeon: Imogene Burn, MD;  Location: [facility] ENDOSCOPY;  Service: Gastroenterology;  Laterality: N/A;   FLEXIBLE SIGMOIDOSCOPY N/A 09/03/2022   Procedure: FLEXIBLE SIGMOIDOSCOPY;  Surgeon: Jeani Hawking, MD;  Location: [facility] ENDOSCOPY;  Service: Gastroenterology;  Laterality: N/A;   HEMOSTASIS CLIP PLACEMENT  03/13/2019   Procedure: HEMOSTASIS CLIP PLACEMENT;  Surgeon: Beverley Fiedler, MD;  Location: [facility] ENDOSCOPY;  Service: Gastroenterology;;   HEMOSTASIS CLIP PLACEMENT  05/23/2022   Procedure: HEMOSTASIS CLIP PLACEMENT;  Surgeon: Jeani Hawking, MD;  Location: [facility] ENDOSCOPY;   Service: Gastroenterology;;   HEMOSTASIS CONTROL  05/24/2022   Procedure: HEMOSTASIS CONTROL;  Surgeon: Imogene Burn, MD;  Location: [facility] ENDOSCOPY;  Service: Gastroenterology;;   HOT HEMOSTASIS N/A 05/23/2022   Procedure: HOT HEMOSTASIS (ARGON PLASMA COAGULATION/BICAP);  Surgeon: Jeani Hawking, MD;  Location: [facility] ENDOSCOPY;  Service: Gastroenterology;  Laterality: N/A;   I & D EXTREMITY Left 05/05/2022   Procedure: IRRIGATION AND DEBRIDEMENT LEFT ARM AV FISTULA;  Surgeon: Cephus Shelling, MD;  Location: [facility] OR;  Service: Vascular;  Laterality: Left;   I & D EXTREMITY N/A 11/14/2022   Procedure: IRRIGATION AND DEBRIDEMENT OF LOWER EXTREMITY WOUND;  Surgeon: Nadara Mustard, MD;  Location: [facility] OR;  Service: Orthopedics;  Laterality: N/A;   INSERTION OF DIALYSIS CATHETER Right 04/02/2022   Procedure: INSERTION OF TUNNELED DIALYSIS CATHETER;  Surgeon: Nada Libman, MD;  Location: [facility] OR;  Service: Vascular;  Laterality: Right;   KNEE ARTHROSCOPY Left 10/25/2006   POLYPECTOMY  03/13/2019  Procedure: POLYPECTOMY;  Surgeon: Beverley Fiedler, MD;  Location: [facility] ENDOSCOPY;  Service: Gastroenterology;;   REMOVAL OF STONES  02/03/2022   Procedure: REMOVAL OF STONES;  Surgeon: Jeani Hawking, MD;  Location: [facility] ENDOSCOPY;  Service: Gastroenterology;;   REVISON OF ARTERIOVENOUS FISTULA Left 08/20/2022   Procedure: REVISON OF LEFT ARM ARTERIOVENOUS FISTULA;  Surgeon: Nada Libman, MD;  Location: [facility] OR;  Service: Vascular;  Laterality: Left;   RIGHT HEART CATH N/A 07/24/2017   Procedure: RIGHT HEART CATH;  Surgeon: Dolores Patty, MD;  [facility];  Service: Cardiovascular;  Laterality: N/A;   RIGHT HEART CATHETERIZATION N/A 09/22/2013   Procedure: RIGHT HEART CATH;  Surgeon: Dolores Patty, MD;  Location: [facility] CATH LAB;  Service: Cardiovascular;  Laterality: N/A;   SHOULDER ARTHROSCOPY WITH OPEN ROTATOR CUFF REPAIR Right 03/14/2014   Procedure: RIGHT SHOULDER ARTHROSCOPY WITH BICEPS  RELEASE, OPEN SUBSCAPULA REPAIR, OPEN SUPRASPINATUS REPAIR.;  Surgeon: Cammy Copa, MD;  Location: [facility] OR;  Service: Orthopedics;  Laterality: Right;   SPHINCTEROTOMY  02/03/2022   Procedure: SPHINCTEROTOMY;  Surgeon: Jeani Hawking, MD;  Location: [facility] ENDOSCOPY;  Service: Gastroenterology;;   STUMP REVISION Right 06/13/2022   Procedure: REVISION RIGHT BELOW KNEE AMPUTATION;  Surgeon: Nadara Mustard, MD;  Location: [facility] OR;  Service: Orthopedics;  Laterality: Right;   TEE WITHOUT CARDIOVERSION N/A 02/04/2022   Procedure: TRANSESOPHAGEAL ECHOCARDIOGRAM (TEE);  Surgeon: Dolores Patty, MD;  Location: [facility] ENDOSCOPY;  Service: Cardiovascular;  Laterality: N/A;   THROMBECTOMY W/ EMBOLECTOMY Left 08/20/2022   Procedure: THROMBECTOMY OF LEFT ARM ARTERIOVENOUS FISTULA;  Surgeon: Nada Libman, MD;  Location: [facility] OR;  Service: Vascular;  Laterality: Left;   TOE AMPUTATION Right 02/24/2018   GREAT TOE AND 2ND TOE AMPUTATION   TUBAL LIGATION  1970's   Patient Active Problem List   Diagnosis Date Noted   Hyperosmolar hyperglycemic state (HHS) (HCC) 02/05/2023   Depression 01/29/2023   Nausea 01/24/2023   Intractable nausea and vomiting 01/20/2023   Proctitis 01/15/2023   Acute on chronic anemia 11/25/2022   Right below-knee amputee (HCC) 11/20/2022   Cellulitis of left lower extremity 11/14/2022   Maggot infestation 11/12/2022   Complicated wound infection 11/10/2022   Secondary hypercoagulable state (HCC) 09/04/2022   Right sided abdominal pain 08/31/2022   Constipation 06/07/2022   History of Clostridioides difficile colitis 06/06/2022   Below-knee amputation of right lower extremity (HCC) 06/06/2022   Diverticulitis 06/05/2022   Stercoral colitis 06/05/2022   C. difficile colitis 06/05/2022   Spleen hematoma 06/05/2022   Dehiscence of amputation stump of right lower extremity (HCC) 06/05/2022   Rectal ulcer 05/27/2022   ESRD (end stage renal disease) (HCC) 05/27/2022   GI bleed  05/23/2022   Difficult intravenous access 05/23/2022   Gangrene of right foot (HCC) 05/02/2022   S/P BKA (below knee amputation) unilateral, right (HCC) 05/02/2022   Unspecified protein-calorie malnutrition (HCC) 04/15/2022   Secondary hyperparathyroidism of renal origin (HCC) 04/14/2022   Coagulation defect, unspecified (HCC) 04/09/2022   Acquired absence of other left toe(s) (HCC) 04/07/2022   Allergy, unspecified, initial encounter 04/07/2022   Dependence on renal dialysis (HCC) 04/07/2022   Gout due to renal impairment, unspecified site 04/07/2022   Hypertensive heart and chronic kidney disease with heart failure and with stage 5 chronic kidney disease, or end stage renal disease (HCC) 04/07/2022   Personal history of transient ischemic attack (TIA), and cerebral infarction without residual deficits 04/07/2022   Renal osteodystrophy 04/07/2022   Venous  stasis ulcer of right calf (HCC) 03/31/2022   Fistula, colovaginal 03/26/2022   Diarrhea 03/26/2022   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   Sepsis without acute organ dysfunction (HCC)    Bacteremia    Acute pancreatitis 02/01/2022   Abdominal pain 02/01/2022   SIRS (systemic inflammatory response syndrome) (HCC) 02/01/2022   Transaminitis 02/01/2022   History of anemia due to chronic kidney disease 02/01/2022   Paroxysmal atrial fibrillation (HCC) 02/01/2022   Uncontrolled type 2 diabetes mellitus with hyperglycemia, with long-term current use of insulin (HCC) 01/14/2022   NSTEMI (non-ST elevated myocardial infarction) (HCC) 03/05/2021   Acute renal failure superimposed on stage 4 chronic kidney disease (HCC) 08/22/2020   Hypoalbuminemia 05/25/2020   GERD (gastroesophageal reflux disease) 05/25/2020   Pressure injury of skin 05/17/2020   Acute on chronic combined systolic and diastolic congestive heart failure (HCC) 03/07/2020   Type 2 diabetes mellitus with diabetic polyneuropathy, with long-term current use of insulin (HCC)  03/07/2020   Obesity, Class III, BMI 40-49.9 (morbid obesity) (HCC) 03/07/2020   Common bile duct (CBD) obstruction 05/28/2019   Benign neoplasm of ascending colon    Benign neoplasm of transverse colon    Benign neoplasm of descending colon    Benign neoplasm of sigmoid colon    Gastric polyps    Hyperkalemia 03/11/2019   Prolonged QT interval 03/11/2019   Acute blood loss anemia 03/11/2019   Onychomycosis 06/21/2018   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)    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 (HCC)    PVD (peripheral vascular disease) (HCC) 10/26/2017   E-coli UTI 07/27/2017   Hypothyroidism 07/27/2017   AKI (acute kidney injury) (HCC)    PAH (pulmonary artery hypertension) (HCC)    Impaired ambulation 07/19/2017   Nausea & vomiting 07/15/2017   Leg cramps 02/27/2017   Peripheral edema 01/12/2017   Diabetic neuropathy (HCC) 11/12/2016   CKD (chronic kidney disease), stage IV (HCC) 10/24/2015   Anemia of chronic disease 10/03/2015   Historical diagnosis of generalized anxiety disorder 10/03/2015   Secondary insomnia 10/03/2015   Hyperglycemia due to diabetes mellitus (HCC) 06/07/2015   Non compliance with medical treatment 04/17/2014   Rotator cuff tear 03/14/2014   Obesity 09/23/2013   Chronic HFrEF (heart failure with reduced ejection fraction) (HCC) 06/03/2013   Hypotension 12/25/2012   Hyponatremia 12/25/2012   Urinary incontinence    MDD (major depressive disorder), recurrent episode, moderate (HCC) 11/12/2010   RBBB (right bundle branch block)    Wide-complex tachycardia    Coronary artery disease    Hyperlipemia 01/22/2009   Chronic hypotension 01/22/2009    ONSET DATE: 03/09/2023 MD referral to PT  REFERRING DIAG: S88.111D (ICD-10-CM) - Below-knee amputation of right lower extremity, subsequent encounter   THERAPY DIAG:  Unsteadiness on feet  Muscle weakness (generalized)  Phantom limb syndrome with pain (HCC)  Other  abnormalities of gait and mobility  Impaired functional mobility, balance, gait, and endurance  Rationale for Evaluation and Treatment: Rehabilitation  SUBJECTIVE:   SUBJECTIVE STATEMENT: She stood at sink with husband 5 times but her legs were very sore.   PERTINENT HISTORY: Right TTA 05/02/22, Lymphedema, idiopathic chronic venous HTN with ulcer, depression, colitis, diverticulitis, ESRD, gout, NSTEMI, DM2, polyneuropathy, PVD, CAD, right bundle branch block, mini strokes  PAIN:  Are you having pain? Phantom pain with no known aggravation.   PRECAUTIONS: Fall and Other: No BP LUE  WEIGHT BEARING RESTRICTIONS: No  FALLS: Has patient fallen in last 6 months?  No  LIVING ENVIRONMENT: Lives with: lives with their spouse and 2 small dogs Lives in: mobile home Home Access: Ramped entrance Home layout: One level Stairs: Yes: External: 6-7 steps; can reach both Has following equipment at home: Single point cane, Environmental consultant - 2 wheeled, Environmental consultant - 4 wheeled, Wheelchair (manual), [employer], Grab bars, and Ramped entry  OCCUPATION:  retired  PLOF: prior to amputation for ~year used RW in home & community  PATIENT GOALS:  to use prosthesis to walk, get out w/c in & out of car, get to bathroom.   OBJECTIVE:  COGNITION: Overall cognitive status:  Eval on 03/23/2023:   Within functional limits for tasks assessed  POSTURE:  Eval on 03/23/2023:  rounded shoulders, forward head, increased thoracic kyphosis, flexed trunk , and weight shift left  LOWER EXTREMITY ROM: ROM Right eval Left eval  Hip flexion    Hip extension    Hip abduction    Hip adduction    Hip internal rotation    Hip external rotation    Knee flexion    Knee extension    Ankle dorsiflexion    Ankle plantarflexion    Ankle inversion    Ankle eversion     (Blank rows = not tested)  LOWER EXTREMITY MMT:  MMT Right eval Left eval  Hip flexion 3-/5 3-/5  Hip extension 2/5 2/5  Hip abduction 2+/5 2+/5  Hip  adduction    Hip internal rotation    Hip external rotation    Knee flexion 3-/5 3-/5  Knee extension 3-/5 3-/5  Ankle dorsiflexion    Ankle plantarflexion    Ankle inversion    Ankle eversion    At Evaluation all strength testing is grossly seated and functionally standing / gait. (Blank rows = not tested)  TRANSFERS:  Eval on 03/23/2023:  Sit to stand: Max A (two-person assist) from 22" w/c to rolling walker Stand to sit: Max A (two-person assist) rolling walker to wheelchair  FUNCTIONAL TESTs:   Eval on 03/23/2023: Patient maintained upright holding rolling walker with mod assist 2 people for safety for 30 seconds.  GAIT: Eval on 03/23/2023:  Patient took 2 steps (one per LE) with +2 max assist with rolling walker and TTA prosthesis.  Patient adducting prosthesis stepping on left foot with no awareness.  CURRENT PROSTHETIC WEAR ASSESSMENT:  Eval on 03/23/2023:   Patient is dependent with: skin check, residual limb care, prosthetic cleaning, ply sock cleaning, correct ply sock adjustment, proper wear schedule/adjustment, and proper weight-bearing schedule/adjustment Donning prosthesis: Max A Doffing prosthesis: Min A Prosthetic wear tolerance: 2-6 hours, 1x/day, 3-4 days/week Prosthetic weight bearing tolerance: 3 minutes with limb pain Edema: pitting edema Residual limb condition: No open areas, dry skin, normal color and temperature, cylindrical shape Prosthetic description: Silicone liner with pin lock suspension, total contact socket with flexible inner socket, sach foot    TODAY'S TREATMENT:  DATE:  04/13/2023: Therapeutic Activities: Scooting transfer w/c <> level mat table with modA with cues on technique. Pt appeared to participate more in transfer today.  Sit <> supine via right sidelying with minA trunk control & modA to move BLEs onto  bed Supine to right sidelying with simulated bedrail with minA. Right sidelying to sit on edge of bed with mod-maxA.  PT cueing on bed mobility with recommendation to work on getting in & out of bed on each side so one time pillow is on left side and next time pillow is on right side. Progress head of bed from ~45* to flat over time. Pt & husband verbalized understanding.   Therapeutic  Exercise: Nustep seat 9 level 5 with BLEs & BUEs 5 min 2 sets with timed 2 min rest bw sets. HR 92 SpO2 99% Bridge 5 reps 2 sets Lower trunk rotation 5 reps 2 sets Heel slide with 18" ball & strap with active knee flexion and knee ext / leg press motion 5 reps 2 sets  Supine hip abduction with PT tactile cues to minimize ER 5 reps 2 sets Trunk motions for core stability without UE support but close supervision sitting at edge of mat with bil. Feet supported on floor: 5 reps ea trunk rotation right/left, controlled back lean with sit up with target 5" behind, forward controlled lean with sit up with target 5" anterior and lateral lean down on elbow with contralateral UE HHA.   PT adding to HEP see [employer] below with HO, verbal & tactile cues.  Pt needs another session before trying at home.    04/08/2023: Therapeutic Activities: Scooting transfer w/c <> Nustep with modA with cues on technique. Pt appeared to participate more in transfer today.  PT demo and verbal cues on transfers to right and left scooting including set up between the 2 chairs.  PT recommending to transfer to bedside commode.  Patient and husband verbalized understanding of appropriate transfer. PT demo and verbal cues on scooting forward by walking her hips to the edge of the chair.  Patient returned demonstration 3 repetitions.  Moving back in wheelchair by lifting buttocks with proper technique.  Patient returned demonstration 3 repetitions. PT demo and verbal cues on using head on transfer wheelchair to toilet by scooting forward with front  edge of wheelchair touching the front of the toilet and cues on managing pants prior to performing transfer.  Patient able to to perform with minA.  Then transferred back to the wheelchair scooting back with modA.  PT explained this might be a safer transfer her if she needs to toilet while out in public.  Patient is still able to toilet facing backwards as she was as long as her buttocks are over the water.  Patient and husband verbalized understanding and reports this would be very helpful. Sitting to partial stand pushing with bilateral lower extremities and bilateral upper extremities and have to lift buttocks off the wheelchair with weight on BLEs.  Patient performed 5 reps with minA and constant cueing   Therapeutic  Exercise: Nustep seat 9 level 5 with BLEs & BUEs 5 min 2 sets with timed 2 min rest bw sets. HR 92 SpO2 99%    04/06/2023: Therapeutic Activities: Scooting transfer w/c <> Nustep with modA with cues on technique. Pt appeared to participate more in transfer today.  Single UE red Tband exercises for core stabilization with cues for upright posture with single UE support on lap5 reps 2 sets ea  UE: row, biceps curl and forward reach. Sit to/from stand from 24" bar stool pulling on //bars with modA 1st rep then MinA/CGA and static stance 5 sec 5 reps.  Sit to stand from 21" w/c with maxA and modA to control descent Trunk motions for core stability without UE support but close supervision: 5 reps ea trunk rotation right/left, controlled back lean with sit up with target 5" behind and forward controlled lean with sit up with target 5" anterior   Therapeutic  Exercise: Nustep seat 9 level 5 with BLEs & BUEs 5 min 2 sets with timed 2 min rest bw sets. HR 92 SpO2 99%    PATIENT EDUCATION: PATIENT EDUCATED ON FOLLOWING PROSTHETIC CARE: Education details: Use of stockinette under proximal liner to decrease itching sensation, no wounds presents to PT recommended not using Vive wear  under liner Skin check, Prosthetic cleaning, Propper donning, and Proper wear schedule/adjustment Prosthetic wear tolerance: 3 hours 2x/day, 4 days/week Person educated: Patient and Spouse Education method: Explanation, Tactile cues, and Verbal cues Education comprehension: verbalized understanding, verbal cues required, tactile cues required, and needs further education  HOME EXERCISE PROGRAM: Access Code: [email or web address] URL: [email or web address] Date: 04/13/2023 Prepared by: Vladimir Faster  Exercises - Supine Bridge  - 1 x daily - 4 x weekly - 2 sets - 5 reps - 2 seconds hold - Supine Lower Trunk Rotation  - 1 x daily - 4 x weekly - 2 sets - 5 reps - 5 seconds hold - Supine Heel Slide with Strap  - 1 x daily - 4 x weekly - 2 sets - 5 reps - 2-3 seconds hold - Supine Hip Abduction  - 1 x daily - 4 x weekly - 2 sets - 5 reps - 2-3 seconds hold - Seated Hip Flexion Toward Target  - 1 x daily - 4 x weekly - 2 sets - 5 reps - 5 seconds hold - Seated Eccentric Abdominal Lean Back  - 1 x daily - 4 x weekly - 2 sets - 5 reps - 5 seconds hold - Seated Sidebending  - 1 x daily - 4 x weekly - 2 sets - 5 reps - 5 seconds hold - Seated Trunk Rotation with Crossed Arms  - 1 x daily - 4 x weekly - 2 sets - 5 reps - 5 seconds hold   ASSESSMENT:  CLINICAL IMPRESSION: PT began instruction in HEP in bed and bed mobility which they appear to understand but needs more instruction prior to trying at home.   Pt continues to benefit from skilled PT.   OBJECTIVE IMPAIRMENTS: Abnormal gait, decreased activity tolerance, decreased balance, decreased endurance, decreased knowledge of condition, decreased knowledge of use of DME, decreased mobility, difficulty walking, decreased ROM, decreased strength, decreased safety awareness, postural dysfunction, prosthetic dependency , and pain.   ACTIVITY LIMITATIONS: standing, transfers, and locomotion level  PARTICIPATION LIMITATIONS: community activity,  household mobility and dependency / burden of care on family  PERSONAL FACTORS: Age, Fitness, Past/current experiences, Time since onset of injury/illness/exacerbation, and 3+ comorbidities: see PMH  are also affecting patient's functional outcome.   REHAB POTENTIAL: Good  CLINICAL DECISION MAKING: Evolving/moderate complexity  EVALUATION COMPLEXITY: Moderate   GOALS: Goals reviewed with patient? Yes  SHORT TERM GOALS: Target date: 04/22/2023:  Patient donnes prosthesis correctly with husband's assist & verbalizes proper cleaning. Baseline: SEE OBJECTIVE DATA Goal status: Ongoing 03/30/2023 2.  Patient tolerates prosthesis wear on non-dailysis days >8 hrs total /day and on dialysis days >2 hours/day without  skin issues or limb pain >5/10. Baseline: SEE OBJECTIVE DATA Goal status: Ongoing 03/30/2023  3.  Patient able to stand with rolling walker with 1 person modA for 1 min.  Baseline: SEE OBJECTIVE DATA Goal status: Ongoing 03/30/2023  4. Patient ambulates 5' with RW & prosthesis with +2 maxA.  Baseline: SEE OBJECTIVE DATA Goal status: Ongoing 03/30/2023  5. Patient sit to/from stand transfer with +1 maxA and scooting transfer with modA. Baseline: SEE OBJECTIVE DATA Goal status: Ongoing 03/30/2023  LONG TERM GOALS: Target date: 06/17/2022  Patient & husband demonstrates & verbalizes understanding of prosthetic care to enable safe utilization of prosthesis. Baseline: SEE OBJECTIVE DATA Goal status: Ongoing 03/30/2023  Patient tolerates prosthesis wear >80% of awake hours on non-dialysis days and >50% on dialysis days without skin issues and limb pain </= 4/10. Baseline: SEE OBJECTIVE DATA Goal status: Ongoing 03/30/2023  Scooting transfers with minA and sit to/from stand transfers with modA.  Baseline: SEE OBJECTIVE DATA Goal status: Ongoing 03/30/2023  Patient ambulates >10' with prosthesis & RW with modA.  Baseline: SEE OBJECTIVE DATA Goal status: Ongoing  03/30/2023  Patient stands with RW support with minA or less for 2 min.  Baseline: SEE OBJECTIVE DATA Goal status:   Ongoing 03/30/2023   PLAN:  PT FREQUENCY: 2x/week  PT DURATION: 12 weeks  PLANNED INTERVENTIONS: 97164- PT Re-evaluation, 97110-Therapeutic exercises, 97530- Therapeutic activity, 97112- Neuromuscular re-education, [phone]- Self Care, 60454- Gait training, [phone]- Prosthetic training, Patient/Family education, Balance training, DME instructions, Therapeutic exercises, Therapeutic activity, Neuromuscular re-education, Gait training, and Self Care  PLAN FOR NEXT SESSION   review HEP,  continue Nustep.  Core stabilization on bar stool & edge of bed.     Vladimir Faster, PT, DPT 04/13/2023, 2:47 PM

## 2023-04-13 NOTE — Progress Notes (Signed)
Advanced Heart Failure Clinic Note   PCP:  Donita Brooks, MD Nephrology: Dr Signe Colt HF Cardiologist: Arvilla Meres, MD   Reason for Visit:  Hospital follow-up  HPI:  Susan [name] is a 72 y.o. female with morbid obesity, RBBB, DM2, Hypothyroidism diastolic heart failure, CAD MI 2000/2007 BMS 2007, TIA, ESRD on HD and PAD.  Had home sleep study 07/25/20 and this was negative for OSA.   Admitted 10/22 with NSTEMI. Echo with decline in EF to 30-35% and new inferior WMA. Suspect progression in RCA disease.Managed medically due to CKD IV (Cr ~3.5).    Echo 7/23 showed EF 25-30%, severe LV dysfunction, grade II DD, RV mildly reduced.  Admitted 8/23 with DKA. Blood glucose > 1200. Ltd Echo showed EF 25-30%, RV moderately down. Hospitalization c/b AFib with RVR. Cards consulted and she was started on amio and Eliquis. PT rec SNF/CIR, she declined. She was discharged home, weight 244 lbs.  Admitted 9/23 with gallstone pancreatitis and CBD stone s/p extraction in addition to Enterococcus faecalis and E. Faecium bacteremia. TEE was negative for endocarditis, LVEF 30%, RV mod HK w/ severe TR. She was improving so planned for conservative management w/o surgery, per general surgery. AHF team consulted for help with volume management. GDMT limited due to low BP and CKD. Received IV abx per ID.  Discharged home, weight 235 lbs. Of note, C-Diff + after discharge, and started on po vanco qid by ID.  Patient started HD for ESRD in November 2023.  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 Readmitted later in the month with diarrhea and bloody bowel movements. She required 4 u RBCS. GI consulted and underwent oversewing of rectal ulcer. Readmitted 01/24 with constipation and stercoral colitis. Also positive for C.diff. During admission also needed R BKA revision and placement of VAC d/t wound dehiscence.  Admitted 4/23 with proctitis and found to have splenic infarct. Anticoagulation  restarted (had been stopped d/t GI bleeding). Later developed WCT concerning for VT versus atrial flutter with aberrancy. Cardiology consulted and stated on amio gtt. She had no further recurrence and was transitioned back to PO. not a candidate for ICD/device due to active infection and ESRD with HD line in place. She was discharged on 4/12.  Admitted 6/24 with necrotic tissue to LLE, required IV abx and wound debridement with wound Vac. She was discharged to CIR. Re-admitted to N/V 8/24, felt to be 2/2 gastroparesis.  Follow up with [facility] 11/24, plan for R brachiobasilic arteriovenous fistula, scheduled for 04/27/23  Today she returns for HF follow up with her husband. Overall feeling fair. She is working with PT, struggling to walk on her prosthesis. She has SOB working with PT, wears 3 L oxygen at night. She has rare atypical chest pain. She has swelling in her leg. Denies palpitations, dizziness, abnormal bleeding or PND/Orthopnea. Appetite ok. No fever or chills. Taking all medications. Dialysis on T/Th/Sat. She makes a little urine. Required midodrine at dialysis, takes her Coreg before dialysis.  Cardiac Studies:  - TEE (9/23): EF 30%, LV moderately decreased function, RV moderately decreased function, no valvular vegetation, severe TR, moderate plaque in descending aorta.  - Echo (9/23): EF 30%, severe LV dysfunction, RV to mildly reduced, severe TR.  - Ltd Echo (8/23): EF 30-35%, moderately decreased LV, RV moderately reduced, mild to moderate TR  - Echo (7/23): EF 25-30%, severe LV dysfunction, grade II DD, RV mildly reduced.  - Echo (10/22): EF 30-35%  -  Echo (9/21): EF 55-60%, RV ok  - Echo (10/19): EF 55-60%, Grade I DD  - RHC (3/19): showed mild PAH with evidence of RV strain likely due to OHS/OSA.   RA = 17 RV = 48/15 PA = 49/11 (30) PCW = 18 Fick cardiac output/index = 7.0/3.0 PVR = 0.5 WU Ao sat = 97% PA sat = 62%, 64%  1. Normal left-sided pressures 2. Mild PAH  with evidence of RV strain likely due to OHS/OSA 3. Normal cardiac output  - Lexiscan w/ stress echo (2018): EF 68%, low risk  - LHC (2007): BMS distal RCA  - Cardiolite (03/2002): EF of 51% but no ischemia  - LHC (2000): BMS RCA   ROS: All systems reviewed and negative except as per HPI.   Allergies  Allergen Reactions   Cephalexin Diarrhea and Other (See Comments)   Codeine Nausea And Vomiting and Other (See Comments)   Past Medical History:  Diagnosis Date   Anemia    hx   Anxiety    Arthritis    "generalized" (03/15/2014)   CAD (coronary artery disease)    MI in 2000 - MI  2007 - treated bare metal stent (no nuclear since then as 9/11)   Carotid artery disease (HCC)    Chronic diastolic heart failure (HCC)    a) ECHO (08/2013) EF 55-60% and RV function nl b) RHC (08/2013) RA 4, RV 30/5/7, PA 25/10 (16), PCWP 7, Fick CO/CI 6.3/2.7, PVR 1.5 WU, PA 61 and 66%   Daily headache    "~ every other day; since I fell in June" (03/15/2014)   Depression    Diabetic retinopathy (HCC)    Dyslipidemia    ESRD (end stage renal disease) (HCC)    Dialysis on Tues Thurs Sat   Exertional shortness of breath    History of kidney stones    HTN (hypertension)    Hypothyroidism    Obesity    Osteoarthritis    PAF (paroxysmal atrial fibrillation) (HCC)    Peripheral neuropathy    bilateral feet/hands   PONV (postoperative nausea and vomiting)    RBBB (right bundle branch block)    Old   Stroke (HCC)    mini strokes   Type II diabetes mellitus (HCC)    Type II, [name]. patient has omnipod insulin pump with Novolin R Insulin   Family History  Problem Relation Age of Onset   Heart attack Mother 58   Past Surgical History:  Procedure Laterality Date   A/V FISTULAGRAM Left 11/07/2022   Procedure: A/V Fistulagram;  Surgeon: Leonie Douglas, MD;  [facility];  Service: Cardiovascular;  Laterality: Left;   ABDOMINAL HYSTERECTOMY  1980's    AMPUTATION Right 02/24/2018   Procedure: RIGHT FOOT GREAT TOE AND 2ND TOE AMPUTATION;  Surgeon: Nadara Mustard, MD;  Location: [facility] OR;  Service: Orthopedics;  Laterality: Right;   AMPUTATION Right 04/30/2018   Procedure: RIGHT TRANSMETATARSAL AMPUTATION;  Surgeon: Nadara Mustard, MD;  Location: [facility] OR;  Service: Orthopedics;  Laterality: Right;   AMPUTATION Right 05/02/2022   Procedure: RIGHT BELOW KNEE AMPUTATION;  Surgeon: Nadara Mustard, MD;  Location: [facility] OR;  Service: Orthopedics;  Laterality: Right;   APPLICATION OF WOUND VAC Right 06/13/2022   Procedure: APPLICATION OF WOUND VAC;  Surgeon: Nadara Mustard, MD;  Location: [facility] OR;  Service: Orthopedics;  Laterality: Right;   APPLICATION OF WOUND VAC Left 11/14/2022   Procedure: APPLICATION OF WOUND  VAC;  Surgeon: Nadara Mustard, MD;  Location: [facility] OR;  Service: Orthopedics;  Laterality: Left;   AV FISTULA PLACEMENT Left 04/02/2022   Procedure: LEFT ARM ARTERIOVENOUS (AV) FISTULA CREATION;  Surgeon: Nada Libman, MD;  Location: [facility] OR;  Service: Vascular;  Laterality: Left;  PERIPHERAL NERVE BLOCK   BASCILIC VEIN TRANSPOSITION Left 07/31/2022   Procedure: LEFT ARM SECOND STAGE BASILIC VEIN TRANSPOSITION;  Surgeon: Nada Libman, MD;  [facility]: [facility] OR;  Service: Vascular;  Laterality: Left;   BIOPSY  05/27/2020   Procedure: BIOPSY;  Surgeon: Lanelle Bal, DO;  [facility];  Service: Endoscopy;;   CATARACT EXTRACTION, BILATERAL Bilateral ?2013   COLONOSCOPY W/ POLYPECTOMY     COLONOSCOPY WITH PROPOFOL N/A 03/13/2019   Procedure: COLONOSCOPY WITH PROPOFOL;  Surgeon: Beverley Fiedler, MD;  Location: [facility] ENDOSCOPY;  Service: Gastroenterology;  Laterality: N/A;   CORONARY ANGIOPLASTY WITH STENT PLACEMENT  1999; 2007   "1 + 1"   ERCP N/A 02/03/2022   Procedure: ENDOSCOPIC RETROGRADE CHOLANGIOPANCREATOGRAPHY (ERCP);  Surgeon: Jeani Hawking, MD;  Location: [facility] ENDOSCOPY;  Service: Gastroenterology;  Laterality: N/A;   ESOPHAGOGASTRODUODENOSCOPY  N/A 02/12/2023   Procedure: ESOPHAGOGASTRODUODENOSCOPY (EGD);  Surgeon: Jeani Hawking, MD;  Location: [facility] ENDOSCOPY;  Service: Gastroenterology;  Laterality: N/A;   ESOPHAGOGASTRODUODENOSCOPY (EGD) WITH PROPOFOL N/A 03/13/2019   Procedure: ESOPHAGOGASTRODUODENOSCOPY (EGD) WITH PROPOFOL;  Surgeon: Beverley Fiedler, MD;  Location: [facility] ENDOSCOPY;  Service: Gastroenterology;  Laterality: N/A;   ESOPHAGOGASTRODUODENOSCOPY (EGD) WITH PROPOFOL N/A 05/27/2020   Procedure: ESOPHAGOGASTRODUODENOSCOPY (EGD) WITH PROPOFOL;  Surgeon: Lanelle Bal, DO;  [facility];  Service: Endoscopy;  Laterality: N/A;   ESOPHAGOGASTRODUODENOSCOPY (EGD) WITH PROPOFOL N/A 09/03/2022   Procedure: ESOPHAGOGASTRODUODENOSCOPY (EGD) WITH PROPOFOL;  Surgeon: Jeani Hawking, MD;  Location: [name] ENDOSCOPY;  Service: Gastroenterology;  Laterality: N/A;   EYE SURGERY Bilateral    lazer   FLEXIBLE SIGMOIDOSCOPY N/A 05/23/2022   Procedure: FLEXIBLE SIGMOIDOSCOPY;  Surgeon: Jeani Hawking, MD;  Location: [facility] ENDOSCOPY;  Service: Gastroenterology;  Laterality: N/A;   FLEXIBLE SIGMOIDOSCOPY N/A 05/24/2022   Procedure: FLEXIBLE SIGMOIDOSCOPY;  Surgeon: Imogene Burn, MD;  Location: [facility] ENDOSCOPY;  Service: Gastroenterology;  Laterality: N/A;   FLEXIBLE SIGMOIDOSCOPY N/A 09/03/2022   Procedure: FLEXIBLE SIGMOIDOSCOPY;  Surgeon: Jeani Hawking, MD;  Location: [facility] ENDOSCOPY;  Service: Gastroenterology;  Laterality: N/A;   HEMOSTASIS CLIP PLACEMENT  03/13/2019   Procedure: HEMOSTASIS CLIP PLACEMENT;  Surgeon: Beverley Fiedler, MD;  Location: [facility] ENDOSCOPY;  Service: Gastroenterology;;   HEMOSTASIS CLIP PLACEMENT  05/23/2022   Procedure: HEMOSTASIS CLIP PLACEMENT;  Surgeon: Jeani Hawking, MD;  Location: [facility] ENDOSCOPY;  Service: Gastroenterology;;   HEMOSTASIS CONTROL  05/24/2022   Procedure: HEMOSTASIS CONTROL;  Surgeon: Imogene Burn, MD;  Location: [facility] ENDOSCOPY;  Service: Gastroenterology;;   HOT HEMOSTASIS N/A 05/23/2022   Procedure: HOT  HEMOSTASIS (ARGON PLASMA COAGULATION/BICAP);  Surgeon: Jeani Hawking, MD;  Location: [facility] ENDOSCOPY;  Service: Gastroenterology;  Laterality: N/A;   I & D EXTREMITY Left 05/05/2022   Procedure: IRRIGATION AND DEBRIDEMENT LEFT ARM AV FISTULA;  Surgeon: Cephus Shelling, MD;  Location: [facility] OR;  Service: Vascular;  Laterality: Left;   I & D EXTREMITY N/A 11/14/2022   Procedure: IRRIGATION AND DEBRIDEMENT OF LOWER EXTREMITY WOUND;  Surgeon: Nadara Mustard, MD;  Location[facility];  Service: Orthopedics;  Laterality: N/A;   INSERTION OF DIALYSIS CATHETER Right 04/02/2022   Procedure: INSERTION OF TUNNELED DIALYSIS CATHETER;  Surgeon: Nada Libman, MD;  Location[facility];  Service: Vascular;  Laterality: Right;   KNEE ARTHROSCOPY Left 10/25/2006   POLYPECTOMY  03/13/2019   Procedure: POLYPECTOMY;  Surgeon: Beverley Fiedler, MD;  Location: [facility] ENDOSCOPY;  Service: Gastroenterology;;   REMOVAL OF STONES  02/03/2022   Procedure: REMOVAL OF STONES;  Surgeon: Jeani Hawking, MD;  Location: [facility] ENDOSCOPY;  Service: Gastroenterology;;   REVISON OF ARTERIOVENOUS FISTULA Left 08/20/2022   Procedure: REVISON OF LEFT ARM ARTERIOVENOUS FISTULA;  Surgeon: Nada Libman, MD;  Location[facility] OR;  Service: Vascular;  Laterality: Left;   RIGHT HEART CATH N/A 07/24/2017   Procedure: RIGHT HEART CATH;  Surgeon: Dolores Patty, MD;  [facility];  Service: Cardiovascular;  Laterality: N/A;   RIGHT HEART CATHETERIZATION N/A 09/22/2013   Procedure: RIGHT HEART CATH;  Surgeon: Dolores Patty, MD;  Location: [facility] CATH LAB;  Service: Cardiovascular;  Laterality: N/A;   SHOULDER ARTHROSCOPY WITH OPEN ROTATOR CUFF REPAIR Right 03/14/2014   Procedure: RIGHT SHOULDER ARTHROSCOPY WITH BICEPS RELEASE, OPEN SUBSCAPULA REPAIR, OPEN SUPRASPINATUS REPAIR.;  Surgeon: Cammy Copa, MD;  Location: [facility] OR;  Service: Orthopedics;  Laterality: Right;   SPHINCTEROTOMY  02/03/2022   Procedure: SPHINCTEROTOMY;  Surgeon: Jeani Hawking, MD;  Location: [facility] ENDOSCOPY;  Service: Gastroenterology;;   STUMP REVISION Right 06/13/2022   Procedure: REVISION RIGHT BELOW KNEE AMPUTATION;  Surgeon: Nadara Mustard, MD;  Location: [facility] OR;  Service: Orthopedics;  Laterality: Right;   TEE WITHOUT CARDIOVERSION N/A 02/04/2022   Procedure: TRANSESOPHAGEAL ECHOCARDIOGRAM (TEE);  Surgeon: Dolores Patty, MD;  Location: [facility] ENDOSCOPY;  Service: Cardiovascular;  Laterality: N/A;   THROMBECTOMY W/ EMBOLECTOMY Left 08/20/2022   Procedure: THROMBECTOMY OF LEFT ARM ARTERIOVENOUS FISTULA;  Surgeon: Nada Libman, MD;  Location[facility] OR;  Service: Vascular;  Laterality: Left;   TOE AMPUTATION Right 02/24/2018   GREAT TOE AND 2ND TOE AMPUTATION   TUBAL LIGATION  1970's   Social History   Socioeconomic History   Marital status: Married    Spouse name: Ramon Dredge   Number of children: 3   Years of education: 12th   Highest education level: Not on file  Occupational History    Employer: UNEMPLOYED  Tobacco Use   Smoking status: Former    Current packs/day: 0.00    Average packs/day: 3.0 packs/day for 32.0 years (96.0 ttl pk-yrs)    Types: Cigarettes    Start date: 10/24/1965    Quit date: 10/24/1997    Years since quitting: 25.4   Smokeless tobacco: Never  Vaping Use   Vaping status: Never Used  Substance and Sexual Activity   Alcohol use: Not Currently    Comment: "might have 2-3 daiquiris in the summer"   Drug use: No   Sexual activity: Not Currently    Birth control/protection: Surgical    Comment: Hysterectomy  Other Topics Concern   Not on file  Social History Narrative   Pt lives at home with her spouse.Caffeine Use- 3 sodas daily.  Update:  now resides [facility]   Social Determinants of Health   Financial Resource Strain: Low Risk  (08/16/2021)   Overall Financial Resource Strain (CARDIA)    Difficulty of Paying Living Expenses: Not hard at all  Food Insecurity: No Food Insecurity (02/11/2023)   Hunger Vital Sign     Worried About Running Out of Food in the Last Year: Never true    Ran Out of Food in the Last Year: Never true  Transportation Needs: No Transportation Needs (02/11/2023)  PRAPARE - Administrator, Civil Service (Medical): No    Lack of Transportation (Non-Medical): No  Physical Activity: Inactive (08/16/2021)   Exercise Vital Sign    Days of Exercise per Week: 0 days    Minutes of Exercise per Session: 0 min  Stress: No Stress Concern Present (08/16/2021)   [employer] of Occupational Health - Occupational Stress Questionnaire    Feeling of Stress : Not at all  Social Connections: Moderately Isolated (08/16/2021)   Social Connection and Isolation Panel [NHANES]    Frequency of Communication with Friends and Family: More than three times a week    Frequency of Social Gatherings with Friends and Family: More than three times a week    Attends Religious Services: Never    Database administrator or Organizations: No    Attends Banker Meetings: Never    Marital Status: Married  Catering manager Violence: Not At Risk (02/11/2023)   Humiliation, Afraid, Rape, and Kick questionnaire    Fear of Current or Ex-Partner: No    Emotionally Abused: No    Physically Abused: No    Sexually Abused: No   Lipid Panel     Component Value Date/Time   CHOL 92 02/01/2022 0615   TRIG 143 02/01/2022 0615   HDL 34 (L) 02/01/2022 0615   CHOLHDL 2.7 02/01/2022 0615   VLDL 29 02/01/2022 0615   LDLCALC 29 02/01/2022 0615   LDLCALC 65 01/17/2019 1608   Current Outpatient Medications on File Prior to Encounter  Medication Sig Dispense Refill   acetaminophen (TYLENOL) 325 MG tablet Take 2 tablets (650 mg total) by mouth every 6 (six) hours as needed for mild pain (or Fever >/= 101).     amiodarone (PACERONE) 200 MG tablet Take 1 tablet (200 mg total) by mouth daily. 30 tablet 0   apixaban (ELIQUIS) 5 MG TABS tablet Take 1 tablet (5 mg total) by mouth 2 (two) times daily. 60  tablet 5   ascorbic acid (VITAMIN C) 500 MG tablet Take 1 tablet (500 mg total) by mouth daily. 100 tablet 0   atorvastatin (LIPITOR) 80 MG tablet TAKE 1 TABLET BY MOUTH EVERY DAY 90 tablet 0   carvedilol (COREG) 6.25 MG tablet Take 6.25 mg by mouth 2 (two) times daily with a meal. Take on Mon/Wed/Fri -non Dialysis days     clonazePAM (KLONOPIN) 0.5 MG disintegrating tablet Take 1 tablet (0.5 mg total) by mouth 2 (two) times daily. 60 tablet 1   insulin aspart (NOVOLOG) 100 UNIT/ML injection Inject 0-15 Units into the skin 3 (three) times daily with meals. CBG < 70: treat low blood sugar, CBG 70 - 120: 0 units, CBG 121 - 150: 2 units, CBG 151 - 200: 3 units, CBG 201 - 250: 5 units, CBG 251 - 300: 8 units, CBG 301 - 350: 11 units, CBG 351 - 400: 15 units, CBG > 400: call MD 60 mL 5   insulin glargine (LANTUS) 100 UNIT/ML Solostar Pen Inject 26 Units into the skin daily. (Patient taking differently: Inject 31 Units into the skin daily.)     levothyroxine (SYNTHROID) 75 MCG tablet Take 1 tablet (75 mcg total) by mouth daily before breakfast. 30 tablet 0   LORazepam (ATIVAN) 0.5 MG tablet Take 1 tablet (0.5 mg total) by mouth at bedtime. 30 tablet 2   methocarbamol (ROBAXIN) 750 MG tablet Take 1 tablet (750 mg total) by mouth every 6 (six) hours as needed for muscle spasms. 60  tablet 0   Methoxy PEG-Epoetin Beta (MIRCERA IJ) Mircera     midodrine (PROAMATINE) 10 MG tablet Take 1 tablet (10 mg total) by mouth daily. 30 tablet 3   nitroGLYCERIN (NITROSTAT) 0.4 MG SL tablet Place 1 tablet (0.4 mg total) under the tongue every 5 (five) minutes as needed. 25 tablet 3   pantoprazole (PROTONIX) 40 MG tablet Take 1 tablet (40 mg total) by mouth 2 (two) times daily before a meal. 180 tablet 1   polyethylene glycol (MIRALAX / GLYCOLAX) 17 g packet Take 17 g by mouth daily. (Patient taking differently: Take 17 g by mouth See admin instructions. Every other day)     venlafaxine XR (EFFEXOR-XR) 75 MG 24 hr capsule  TAKE 1 CAPSULE (75 MG TOTAL) BY MOUTH DAILY WITH BREAKFAST. STOP TRINTELLIX 90 capsule 0   Zinc Sulfate 220 (50 Zn) MG TABS Take 1 tablet (220 mg total) by mouth daily. 100 tablet 0   prochlorperazine (COMPAZINE) 5 MG tablet Take 1 tablet (5 mg total) by mouth every 8 (eight) hours as needed for nausea or vomiting. 60 tablet 0   No current facility-administered medications on file prior to encounter.   Wt Readings from Last 3 Encounters:  04/13/23 105.2 kg (232 lb)  03/27/23 104.3 kg (230 lb)  02/17/23 72 kg (158 lb 11.7 oz)   BP (!) 102/52   Pulse 72   Wt 105.2 kg (232 lb)   SpO2 98%   BMI 38.61 kg/m   PHYSICAL EXAM: General:  NAD. No resp difficulty, arrived in [facility], chronically-ill appearing. HEENT: Normal Neck: Supple. No JVD. Carotids 2+ bilat; no bruits. No lymphadenopathy or thryomegaly appreciated. Cor: PMI nondisplaced. Regular rate & rhythm. No rubs, gallops or murmurs. Lungs: Clear, diminished in bases, TDC site looks ok Abdomen: Soft, nontender, nondistended. No hepatosplenomegaly. No bruits or masses. Good bowel sounds. Extremities: No cyanosis, clubbing, rash, edema; s/p R BKA, chronic venous stasis of LLE Neuro: Alert & oriented x 3, cranial nerves grossly intact. Moves all 4 extremities w/o difficulty. Affect pleasant.  ECG (personally reviewed): NSR, rBBB 69 bpm  ASSESSMENT AND PLAN:  1. Chronic Biventricular CHF - Echo (9/21): EF 55-60%, RV normal  - Echo (10/22): EF down to 30-35% in setting of NSTEMI, Grade II DD. - Echo (7/23): EF 25-30%, RV mildly reduced. - Ltd Echo (8/23): EF 30-35%, RV moderately reduced - Echo (9/23): EF 30%, interventricular septum flattened in diastole consistent with RV volume overload, RV low normal, severe TR - Echo 4/24 EF 30-35%, RV mildly reduced, moderate TR  - NYHA likely IIb-III, confounded by mostly WC-bound and difficulty walking on prosthesis. No resting dyspnea.  - Volume now controlled by iHD, volume stable on exam today.    - Continue Coreg 6.25 mg bid (hold AM dose on dialysis days) - Labs followed at HD  - Update echo next visit.   2. CAD  - Known CAD w/ h/o remote MI in 2000 s/p BMS to RCA and again in 2007 w/ BMS to RCA  - Admitted 10/22 NSTEM (hstrop 247) Echo with decline in EF to 30-35% and new inferior WMA. Suspect progression in RCA disease. No cath due CKD IV - Rare angina (stable). ECG today without acute ST-T changes - Previously not candidate for cath with CKD IV unless ACS or persistent, severe angina--> now ESRD. - Continue atorvastatin 80 mg daily. - Off ASA with need for AC. - Off Imdur due to hypotension.    3. h/o Gallstone pancreatitis -  Lipase 1341>112>60 - s/p ERCP on 02/03/22 with biliary sphincterotomy and balloon extraction. - No evidence of cholecystitis on imaging.  4. h/o Enterococcus faecalis and E faecium bacteremia - Repeat BC X 2 09/11 NGTD - GI source suspected. - No evidence of endocarditis on TEE - ID following and she is now off linezolid.   5 . Paroxysmal AF/AFL - Currently NSR on EKG today.  - Continue amiodarone 200 mg daily. Recent TSH and HFTs ok (9/24) - Continue Eliquis 5 mg bid. Will need to watch for recurrent GIB (denies any issues)   6. ESRD - iHD T,TH,Sat  - Suspect diabetic nephropathy  - Continue midodrine - Hold morning dose of Coreg on dialysis days - Using [facility], planning R AVF next month.  7. Uncontrolled DM II w/ Complications  - Hgb A1c 9 (10/24), previously 15 - On insulin  - Followed by PCP and Endocrinology  - No SGLT2i   8. Anemia of CKD - Labs followed at HD    9. H/o C-Difficile infection - Completed vancomycin - Resolved  10. S/p Rt BKA - Complication of poorly controlled DM  - She has a prosthesis - Followed by Dr. Lajoyce Corners   Follow up with Dr. Gala Romney in 4 months + echo    Anderson Malta Avery, [facility]  04/13/23

## 2023-04-13 NOTE — Patient Instructions (Addendum)
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 Gala Romney with echocardiogram You will receive a reminder letter in the mail a few months in advance. If you don't receive a letter, please call our office to schedule the follow-up appointment.  Your physician has requested that you have an echocardiogram. Echocardiography is a painless test that uses sound waves to create images of your heart. It provides your doctor with information about the size and shape of your heart and how well your heart's chambers and valves are working. This procedure takes approximately one hour. There are no restrictions for this procedure.      Do the following things EVERYDAY: Weigh yourself in the morning before breakfast. Write it down and keep it in a log. Take your medicines as prescribed Eat low salt foods--Limit salt (sodium) to 2000 mg per day.  Stay as active as you can everyday Limit all fluids for the day to less than 2 liters   At [facility], you and your health needs are our priority. As part of our continuing mission to provide you with exceptional heart care, we have created designated Provider Care Teams. These Care Teams include your primary Cardiologist (physician) and Advanced Practice Providers (APPs- Physician Assistants and Nurse Practitioners) who all work together to provide you with the care you need, when you need it.   You may see any of the following providers on your designated Care Team at your next follow up: Dr Arvilla Meres Dr Marca Ancona Dr. Marcos Eke, NP Robbie Lis, [place] [facility] [place], [place] Brynda Peon, NP Karle Plumber,  PharmD   Please be sure to bring in all your medications bottles to every appointment.    Thank you for choosing [facility]  If you have any questions or concerns before your next appointment please send [name] a message through [place] or call our office at [phone].    TO LEAVE A MESSAGE FOR THE NURSE SELECT OPTION 2, PLEASE LEAVE A MESSAGE INCLUDING: YOUR NAME DATE OF BIRTH CALL BACK NUMBER REASON FOR CALL**this is important as we prioritize the call backs  YOU WILL RECEIVE A CALL BACK THE SAME DAY AS LONG AS YOU CALL BEFORE 4:00 PM

## 2023-04-14 DIAGNOSIS — Z992 Dependence on renal dialysis: Secondary | ICD-10-CM | POA: Diagnosis not present

## 2023-04-14 DIAGNOSIS — N2581 Secondary hyperparathyroidism of renal origin: Secondary | ICD-10-CM | POA: Diagnosis not present

## 2023-04-14 DIAGNOSIS — E1122 Type 2 diabetes mellitus with diabetic chronic kidney disease: Secondary | ICD-10-CM | POA: Diagnosis not present

## 2023-04-14 DIAGNOSIS — D689 Coagulation defect, unspecified: Secondary | ICD-10-CM | POA: Diagnosis not present

## 2023-04-14 DIAGNOSIS — N186 End stage renal disease: Secondary | ICD-10-CM | POA: Diagnosis not present

## 2023-04-14 DIAGNOSIS — E039 Hypothyroidism, unspecified: Secondary | ICD-10-CM | POA: Diagnosis not present

## 2023-04-15 ENCOUNTER — Ambulatory Visit: Payer: PPO | Admitting: Physical Therapy

## 2023-04-15 ENCOUNTER — Encounter: Payer: Self-pay | Admitting: Physical Therapy

## 2023-04-15 DIAGNOSIS — R2681 Unsteadiness on feet: Secondary | ICD-10-CM

## 2023-04-15 DIAGNOSIS — Z7409 Other reduced mobility: Secondary | ICD-10-CM | POA: Diagnosis not present

## 2023-04-15 DIAGNOSIS — R2689 Other abnormalities of gait and mobility: Secondary | ICD-10-CM | POA: Diagnosis not present

## 2023-04-15 DIAGNOSIS — G546 Phantom limb syndrome with pain: Secondary | ICD-10-CM

## 2023-04-15 DIAGNOSIS — M6281 Muscle weakness (generalized): Secondary | ICD-10-CM | POA: Diagnosis not present

## 2023-04-15 NOTE — Therapy (Signed)
OUTPATIENT PHYSICAL THERAPY PROSTHETIC TREATMENT   Patient Name: Susan [name] MRN: [id number] DOB:Sep 26, 1950, 72 y.o., female Today's Date: 04/15/2023  PCP: Donita Brooks, MD  REFERRING PROVIDER: Nadara Mustard, MD  END OF SESSION:  PT End of Session - 04/15/23 1515     Visit Number 7    Number of Visits 25    Date for PT Re-Evaluation 06/18/23    Authorization Type [employer]    Progress Note Due on Visit 10    PT Start Time 1515    PT Stop Time 1558    PT Time Calculation (min) 43 min    Equipment Utilized During Treatment Gait belt    Activity Tolerance Patient tolerated treatment well;Patient limited by fatigue    Behavior During Therapy WFL for tasks assessed/performed                   Past Medical History:  Diagnosis Date   Anemia    hx   Anxiety    Arthritis    "generalized" (03/15/2014)   CAD (coronary artery disease)    MI in 2000 - MI  2007 - treated bare metal stent (no nuclear since then as 9/11)   Carotid artery disease (HCC)    Chronic diastolic heart failure (HCC)    a) ECHO (08/2013) EF 55-60% and RV function nl b) RHC (08/2013) RA 4, RV 30/5/7, PA 25/10 (16), PCWP 7, Fick CO/CI 6.3/2.7, PVR 1.5 WU, PA 61 and 66%   Daily headache    "~ every other day; since I fell in June" (03/15/2014)   Depression    Diabetic retinopathy (HCC)    Dyslipidemia    ESRD (end stage renal disease) (HCC)    Dialysis on Tues Thurs Sat   Exertional shortness of breath    History of kidney stones    HTN (hypertension)    Hypothyroidism    Obesity    Osteoarthritis    PAF (paroxysmal atrial fibrillation) (HCC)    Peripheral neuropathy    bilateral feet/hands   PONV (postoperative nausea and vomiting)    RBBB (right bundle branch block)    Old   Stroke (HCC)    mini strokes   Type II diabetes mellitus (HCC)    Type II, [name]. patient has omnipod insulin pump with Novolin R Insulin   Past Surgical History:   Procedure Laterality Date   A/V FISTULAGRAM Left 11/07/2022   Procedure: A/V Fistulagram;  Surgeon: Leonie Douglas, MD;  [facility]: [facility];  Service: Cardiovascular;  Laterality: Left;   ABDOMINAL HYSTERECTOMY  1980's   AMPUTATION Right 02/24/2018   Procedure: RIGHT FOOT GREAT TOE AND 2ND TOE AMPUTATION;  Surgeon: Nadara Mustard, MD;  Location: [facility] OR;  Service: Orthopedics;  Laterality: Right;   AMPUTATION Right 04/30/2018   Procedure: RIGHT TRANSMETATARSAL AMPUTATION;  Surgeon: Nadara Mustard, MD;  Location: [facility];  Service: Orthopedics;  Laterality: Right;   AMPUTATION Right 05/02/2022   Procedure: RIGHT BELOW KNEE AMPUTATION;  Surgeon: Nadara Mustard, MD;  Location: [facility] OR;  Service: Orthopedics;  Laterality: Right;   APPLICATION OF WOUND VAC Right 06/13/2022   Procedure: APPLICATION OF WOUND VAC;  Surgeon: Nadara Mustard, MD;  Location: [facility] OR;  Service: Orthopedics;  Laterality: Right;   APPLICATION OF WOUND VAC Left 11/14/2022   Procedure: APPLICATION OF WOUND VAC;  Surgeon: Nadara Mustard, MD;  Location: [facility] OR;  Service: Orthopedics;  Laterality: Left;  AV FISTULA PLACEMENT Left 04/02/2022   Procedure: LEFT ARM ARTERIOVENOUS (AV) FISTULA CREATION;  Surgeon: Nada Libman, MD;  Location: [facility] OR;  Service: Vascular;  Laterality: Left;  PERIPHERAL NERVE BLOCK   BASCILIC VEIN TRANSPOSITION Left 07/31/2022   Procedure: LEFT ARM SECOND STAGE BASILIC VEIN TRANSPOSITION;  Surgeon: Nada Libman, MD;  Location: [facility] OR;  Service: Vascular;  Laterality: Left;   BIOPSY  05/27/2020   Procedure: BIOPSY;  Surgeon: Lanelle Bal, DO;  Location: [facility];  Service: Endoscopy;;   CATARACT EXTRACTION, BILATERAL Bilateral ?2013   COLONOSCOPY W/ POLYPECTOMY     COLONOSCOPY WITH PROPOFOL N/A 03/13/2019   Procedure: COLONOSCOPY WITH PROPOFOL;  Surgeon: Beverley Fiedler, MD;  Location: [facility] ENDOSCOPY;  Service: Gastroenterology;  Laterality: N/A;   CORONARY ANGIOPLASTY WITH STENT PLACEMENT  1999;  2007   "1 + 1"   ERCP N/A 02/03/2022   Procedure: ENDOSCOPIC RETROGRADE CHOLANGIOPANCREATOGRAPHY (ERCP);  Surgeon: Jeani Hawking, MD;  Location: [facility] ENDOSCOPY;  Service: Gastroenterology;  Laterality: N/A;   ESOPHAGOGASTRODUODENOSCOPY N/A 02/12/2023   Procedure: ESOPHAGOGASTRODUODENOSCOPY (EGD);  Surgeon: Jeani Hawking, MD;  Location: [facility] ENDOSCOPY;  Service: Gastroenterology;  Laterality: N/A;   ESOPHAGOGASTRODUODENOSCOPY (EGD) WITH PROPOFOL N/A 03/13/2019   Procedure: ESOPHAGOGASTRODUODENOSCOPY (EGD) WITH PROPOFOL;  Surgeon: Beverley Fiedler, MD;  Location: [facility] ENDOSCOPY;  Service: Gastroenterology;  Laterality: N/A;   ESOPHAGOGASTRODUODENOSCOPY (EGD) WITH PROPOFOL N/A 05/27/2020   Procedure: ESOPHAGOGASTRODUODENOSCOPY (EGD) WITH PROPOFOL;  Surgeon: Lanelle Bal, DO;  [facility];  Service: Endoscopy;  Laterality: N/A;   ESOPHAGOGASTRODUODENOSCOPY (EGD) WITH PROPOFOL N/A 09/03/2022   Procedure: ESOPHAGOGASTRODUODENOSCOPY (EGD) WITH PROPOFOL;  Surgeon: Jeani Hawking, MD;  Location: [facility] ENDOSCOPY;  Service: Gastroenterology;  Laterality: N/A;   EYE SURGERY Bilateral    lazer   FLEXIBLE SIGMOIDOSCOPY N/A 05/23/2022   Procedure: FLEXIBLE SIGMOIDOSCOPY;  Surgeon: Jeani Hawking, MD;  Location: [facility] ENDOSCOPY;  Service: Gastroenterology;  Laterality: N/A;   FLEXIBLE SIGMOIDOSCOPY N/A 05/24/2022   Procedure: FLEXIBLE SIGMOIDOSCOPY;  Surgeon: Imogene Burn, MD;  Location[facility] ENDOSCOPY;  Service: Gastroenterology;  Laterality: N/A;   FLEXIBLE SIGMOIDOSCOPY N/A 09/03/2022   Procedure: FLEXIBLE SIGMOIDOSCOPY;  Surgeon: Jeani Hawking, MD;  Location: [facility] ENDOSCOPY;  Service: Gastroenterology;  Laterality: N/A;   HEMOSTASIS CLIP PLACEMENT  03/13/2019   Procedure: HEMOSTASIS CLIP PLACEMENT;  Surgeon: Beverley Fiedler, MD;  Location: [facility] ENDOSCOPY;  Service: Gastroenterology;;   HEMOSTASIS CLIP PLACEMENT  05/23/2022   Procedure: HEMOSTASIS CLIP PLACEMENT;  Surgeon: Jeani Hawking, MD;  Location: [facility] ENDOSCOPY;   Service: Gastroenterology;;   HEMOSTASIS CONTROL  05/24/2022   Procedure: HEMOSTASIS CONTROL;  Surgeon: Imogene Burn, MD;  Location: [facility] ENDOSCOPY;  Service: Gastroenterology;;   HOT HEMOSTASIS N/A 05/23/2022   Procedure: HOT HEMOSTASIS (ARGON PLASMA COAGULATION/BICAP);  Surgeon: Jeani Hawking, MD;  Location: [facility] ENDOSCOPY;  Service: Gastroenterology;  Laterality: N/A;   I & D EXTREMITY Left 05/05/2022   Procedure: IRRIGATION AND DEBRIDEMENT LEFT ARM AV FISTULA;  Surgeon: Cephus Shelling, MD;  Location: [facility] OR;  Service: Vascular;  Laterality: Left;   I & D EXTREMITY N/A 11/14/2022   Procedure: IRRIGATION AND DEBRIDEMENT OF LOWER EXTREMITY WOUND;  Surgeon: Nadara Mustard, MD;  Location: [facility];  Service: Orthopedics;  Laterality: N/A;   INSERTION OF DIALYSIS CATHETER Right 04/02/2022   Procedure: INSERTION OF TUNNELED DIALYSIS CATHETER;  Surgeon: Nada Libman, MD;  Location: [facility] OR;  Service: Vascular;  Laterality: Right;   KNEE ARTHROSCOPY Left 10/25/2006   POLYPECTOMY  03/13/2019  Procedure: POLYPECTOMY;  Surgeon: Beverley Fiedler, MD;  Location: [facility] ENDOSCOPY;  Service: Gastroenterology;;   REMOVAL OF STONES  02/03/2022   Procedure: REMOVAL OF STONES;  Surgeon: Jeani Hawking, MD;  Location: [facility] ENDOSCOPY;  Service: Gastroenterology;;   REVISON OF ARTERIOVENOUS FISTULA Left 08/20/2022   Procedure: REVISON OF LEFT ARM ARTERIOVENOUS FISTULA;  Surgeon: Nada Libman, MD;  Location: [facility] OR;  Service: Vascular;  Laterality: Left;   RIGHT HEART CATH N/A 07/24/2017   Procedure: RIGHT HEART CATH;  Surgeon: Dolores Patty, MD;  [facility];  Service: Cardiovascular;  Laterality: N/A;   RIGHT HEART CATHETERIZATION N/A 09/22/2013   Procedure: RIGHT HEART CATH;  Surgeon: Dolores Patty, MD;  Location: [facility] CATH LAB;  Service: Cardiovascular;  Laterality: N/A;   SHOULDER ARTHROSCOPY WITH OPEN ROTATOR CUFF REPAIR Right 03/14/2014   Procedure: RIGHT SHOULDER ARTHROSCOPY WITH BICEPS  RELEASE, OPEN SUBSCAPULA REPAIR, OPEN SUPRASPINATUS REPAIR.;  Surgeon: Cammy Copa, MD;  Location: [facility] OR;  Service: Orthopedics;  Laterality: Right;   SPHINCTEROTOMY  02/03/2022   Procedure: SPHINCTEROTOMY;  Surgeon: Jeani Hawking, MD;  Location: [facility] ENDOSCOPY;  Service: Gastroenterology;;   STUMP REVISION Right 06/13/2022   Procedure: REVISION RIGHT BELOW KNEE AMPUTATION;  Surgeon: Nadara Mustard, MD;  Location: [facility] OR;  Service: Orthopedics;  Laterality: Right;   TEE WITHOUT CARDIOVERSION N/A 02/04/2022   Procedure: TRANSESOPHAGEAL ECHOCARDIOGRAM (TEE);  Surgeon: Dolores Patty, MD;  Location: [facility] ENDOSCOPY;  Service: Cardiovascular;  Laterality: N/A;   THROMBECTOMY W/ EMBOLECTOMY Left 08/20/2022   Procedure: THROMBECTOMY OF LEFT ARM ARTERIOVENOUS FISTULA;  Surgeon: Nada Libman, MD;  Location: [facility] OR;  Service: Vascular;  Laterality: Left;   TOE AMPUTATION Right 02/24/2018   GREAT TOE AND 2ND TOE AMPUTATION   TUBAL LIGATION  1970's   Patient Active Problem List   Diagnosis Date Noted   Hyperosmolar hyperglycemic state (HHS) (HCC) 02/05/2023   Depression 01/29/2023   Nausea 01/24/2023   Intractable nausea and vomiting 01/20/2023   Proctitis 01/15/2023   Acute on chronic anemia 11/25/2022   Right below-knee amputee (HCC) 11/20/2022   Cellulitis of left lower extremity 11/14/2022   Maggot infestation 11/12/2022   Complicated wound infection 11/10/2022   Secondary hypercoagulable state (HCC) 09/04/2022   Right sided abdominal pain 08/31/2022   Constipation 06/07/2022   History of Clostridioides difficile colitis 06/06/2022   Below-knee amputation of right lower extremity (HCC) 06/06/2022   Diverticulitis 06/05/2022   Stercoral colitis 06/05/2022   C. difficile colitis 06/05/2022   Spleen hematoma 06/05/2022   Dehiscence of amputation stump of right lower extremity (HCC) 06/05/2022   Rectal ulcer 05/27/2022   ESRD (end stage renal disease) (HCC) 05/27/2022   GI bleed  05/23/2022   Difficult intravenous access 05/23/2022   Gangrene of right foot (HCC) 05/02/2022   S/P BKA (below knee amputation) unilateral, right (HCC) 05/02/2022   Unspecified protein-calorie malnutrition (HCC) 04/15/2022   Secondary hyperparathyroidism of renal origin (HCC) 04/14/2022   Coagulation defect, unspecified (HCC) 04/09/2022   Acquired absence of other left toe(s) (HCC) 04/07/2022   Allergy, unspecified, initial encounter 04/07/2022   Dependence on renal dialysis (HCC) 04/07/2022   Gout due to renal impairment, unspecified site 04/07/2022   Hypertensive heart and chronic kidney disease with heart failure and with stage 5 chronic kidney disease, or end stage renal disease (HCC) 04/07/2022   Personal history of transient ischemic attack (TIA), and cerebral infarction without residual deficits 04/07/2022   Renal osteodystrophy 04/07/2022   Venous  stasis ulcer of right calf (HCC) 03/31/2022   Fistula, colovaginal 03/26/2022   Diarrhea 03/26/2022   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   Sepsis without acute organ dysfunction (HCC)    Bacteremia    Acute pancreatitis 02/01/2022   Abdominal pain 02/01/2022   SIRS (systemic inflammatory response syndrome) (HCC) 02/01/2022   Transaminitis 02/01/2022   History of anemia due to chronic kidney disease 02/01/2022   Paroxysmal atrial fibrillation (HCC) 02/01/2022   Uncontrolled type 2 diabetes mellitus with hyperglycemia, with long-term current use of insulin (HCC) 01/14/2022   NSTEMI (non-ST elevated myocardial infarction) (HCC) 03/05/2021   Acute renal failure superimposed on stage 4 chronic kidney disease (HCC) 08/22/2020   Hypoalbuminemia 05/25/2020   GERD (gastroesophageal reflux disease) 05/25/2020   Pressure injury of skin 05/17/2020   Acute on chronic combined systolic and diastolic congestive heart failure (HCC) 03/07/2020   Type 2 diabetes mellitus with diabetic polyneuropathy, with long-term current use of insulin (HCC)  03/07/2020   Obesity, Class III, BMI 40-49.9 (morbid obesity) (HCC) 03/07/2020   Common bile duct (CBD) obstruction 05/28/2019   Benign neoplasm of ascending colon    Benign neoplasm of transverse colon    Benign neoplasm of descending colon    Benign neoplasm of sigmoid colon    Gastric polyps    Hyperkalemia 03/11/2019   Prolonged QT interval 03/11/2019   Acute blood loss anemia 03/11/2019   Onychomycosis 06/21/2018   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)    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 (HCC)    PVD (peripheral vascular disease) (HCC) 10/26/2017   E-coli UTI 07/27/2017   Hypothyroidism 07/27/2017   AKI (acute kidney injury) (HCC)    PAH (pulmonary artery hypertension) (HCC)    Impaired ambulation 07/19/2017   Nausea & vomiting 07/15/2017   Leg cramps 02/27/2017   Peripheral edema 01/12/2017   Diabetic neuropathy (HCC) 11/12/2016   CKD (chronic kidney disease), stage IV (HCC) 10/24/2015   Anemia of chronic disease 10/03/2015   Historical diagnosis of generalized anxiety disorder 10/03/2015   Secondary insomnia 10/03/2015   Hyperglycemia due to diabetes mellitus (HCC) 06/07/2015   Non compliance with medical treatment 04/17/2014   Rotator cuff tear 03/14/2014   Obesity 09/23/2013   Chronic HFrEF (heart failure with reduced ejection fraction) (HCC) 06/03/2013   Hypotension 12/25/2012   Hyponatremia 12/25/2012   Urinary incontinence    MDD (major depressive disorder), recurrent episode, moderate (HCC) 11/12/2010   RBBB (right bundle branch block)    Wide-complex tachycardia    Coronary artery disease    Hyperlipemia 01/22/2009   Chronic hypotension 01/22/2009    ONSET DATE: 03/09/2023 MD referral to PT  REFERRING DIAG: S88.111D (ICD-10-CM) - Below-knee amputation of right lower extremity, subsequent encounter   THERAPY DIAG:  Unsteadiness on feet  Muscle weakness (generalized)  Phantom limb syndrome with pain (HCC)  Other  abnormalities of gait and mobility  Impaired functional mobility, balance, gait, and endurance  Rationale for Evaluation and Treatment: Rehabilitation  SUBJECTIVE:   SUBJECTIVE STATEMENT: She was sore from exercises last session shoulders worse than legs.  PERTINENT HISTORY: Right TTA 05/02/22, Lymphedema, idiopathic chronic venous HTN with ulcer, depression, colitis, diverticulitis, ESRD, gout, NSTEMI, DM2, polyneuropathy, PVD, CAD, right bundle branch block, mini strokes  PAIN:  Are you having pain? Phantom pain with no known aggravation.   PRECAUTIONS: Fall and Other: No BP LUE  WEIGHT BEARING RESTRICTIONS: No  FALLS: Has patient fallen in last 6 months? No  LIVING ENVIRONMENT:  Lives with: lives with their spouse and 2 small dogs Lives in: mobile home Home Access: Ramped entrance Home layout: One level Stairs: Yes: External: 6-7 steps; can reach both Has following equipment at home: Single point cane, Environmental consultant - 2 wheeled, Environmental consultant - 4 wheeled, Wheelchair (manual), [employer], Grab bars, and Ramped entry  OCCUPATION:  retired  PLOF: prior to amputation for ~year used RW in home & community  PATIENT GOALS:  to use prosthesis to walk, get out w/c in & out of car, get to bathroom.   OBJECTIVE:  COGNITION: Overall cognitive status:  Eval on 03/23/2023:   Within functional limits for tasks assessed  POSTURE:  Eval on 03/23/2023:  rounded shoulders, forward head, increased thoracic kyphosis, flexed trunk , and weight shift left  LOWER EXTREMITY ROM: ROM Right eval Left eval  Hip flexion    Hip extension    Hip abduction    Hip adduction    Hip internal rotation    Hip external rotation    Knee flexion    Knee extension    Ankle dorsiflexion    Ankle plantarflexion    Ankle inversion    Ankle eversion     (Blank rows = not tested)  LOWER EXTREMITY MMT:  MMT Right eval Left eval  Hip flexion 3-/5 3-/5  Hip extension 2/5 2/5  Hip abduction 2+/5 2+/5  Hip  adduction    Hip internal rotation    Hip external rotation    Knee flexion 3-/5 3-/5  Knee extension 3-/5 3-/5  Ankle dorsiflexion    Ankle plantarflexion    Ankle inversion    Ankle eversion    At Evaluation all strength testing is grossly seated and functionally standing / gait. (Blank rows = not tested)  TRANSFERS:  Eval on 03/23/2023:  Sit to stand: Max A (two-person assist) from 22" w/c to rolling walker Stand to sit: Max A (two-person assist) rolling walker to wheelchair  FUNCTIONAL TESTs:   Eval on 03/23/2023: Patient maintained upright holding rolling walker with mod assist 2 people for safety for 30 seconds.  GAIT: Eval on 03/23/2023:  Patient took 2 steps (one per LE) with +2 max assist with rolling walker and TTA prosthesis.  Patient adducting prosthesis stepping on left foot with no awareness.  CURRENT PROSTHETIC WEAR ASSESSMENT:  Eval on 03/23/2023:   Patient is dependent with: skin check, residual limb care, prosthetic cleaning, ply sock cleaning, correct ply sock adjustment, proper wear schedule/adjustment, and proper weight-bearing schedule/adjustment Donning prosthesis: Max A Doffing prosthesis: Min A Prosthetic wear tolerance: 2-6 hours, 1x/day, 3-4 days/week Prosthetic weight bearing tolerance: 3 minutes with limb pain Edema: pitting edema Residual limb condition: No open areas, dry skin, normal color and temperature, cylindrical shape Prosthetic description: Silicone liner with pin lock suspension, total contact socket with flexible inner socket, sach foot    TODAY'S TREATMENT:  DATE:  04/15/2023: Therapeutic Activities: Pt able to scoot forward to edge of w/c and back into w/c with verbal cues only today.  Scooting transfer w/c <> level mat table with minA with cues on technique. Sit <> supine via right sidelying with minA/CGA trunk  control & minA to move BLEs onto bed Supine to right sidelying with simulated bedrail with minA. Right sidelying to sit on edge of bed with mod-minA.   Therapeutic  Exercise: Bridge 5 reps 2 sets Lower trunk rotation 5 reps 2 sets Heel slide with 18" ball & strap with active knee flexion and knee ext / leg press motion 5 reps 2 sets  Supine hip abduction with PT tactile cues to minimize ER 5 reps 2 sets Trunk motions for core stability without UE support but close supervision sitting at edge of mat with bil. Feet supported on floor: 5 reps ea trunk rotation right/left, controlled back lean with sit up with target 5" behind, forward controlled lean with sit up with target 5" anterior and lateral lean down on elbow with contralateral UE HHA.   PT issued HO with verbal and tactile cues for above as HEP on non-dialysis days. Pt & husband verbalized understanding.     04/13/2023: Therapeutic Activities: Scooting transfer w/c <> level mat table with modA with cues on technique. Pt appeared to participate more in transfer today.  Sit <> supine via right sidelying with minA trunk control & modA to move BLEs onto bed Supine to right sidelying with simulated bedrail with minA. Right sidelying to sit on edge of bed with mod-maxA.  PT cueing on bed mobility with recommendation to work on getting in & out of bed on each side so one time pillow is on left side and next time pillow is on right side. Progress head of bed from ~45* to flat over time. Pt & husband verbalized understanding.   Therapeutic  Exercise: Bridge 5 reps 2 sets Lower trunk rotation 5 reps 2 sets Heel slide with 18" ball & strap with active knee flexion and knee ext / leg press motion 5 reps 2 sets  Supine hip abduction with PT tactile cues to minimize ER 5 reps 2 sets Trunk motions for core stability without UE support but close supervision sitting at edge of mat with bil. Feet supported on floor: 5 reps ea trunk rotation right/left,  controlled back lean with sit up with target 5" behind, forward controlled lean with sit up with target 5" anterior and lateral lean down on elbow with contralateral UE HHA.   PT adding to HEP see [employer] below with HO, verbal & tactile cues.  Pt needs another session before trying at home.    04/08/2023: Therapeutic Activities: Scooting transfer w/c <> Nustep with modA with cues on technique. Pt appeared to participate more in transfer today.  PT demo and verbal cues on transfers to right and left scooting including set up between the 2 chairs.  PT recommending to transfer to bedside commode.  Patient and husband verbalized understanding of appropriate transfer. PT demo and verbal cues on scooting forward by walking her hips to the edge of the chair.  Patient returned demonstration 3 repetitions.  Moving back in wheelchair by lifting buttocks with proper technique.  Patient returned demonstration 3 repetitions. PT demo and verbal cues on using head on transfer wheelchair to toilet by scooting forward with front edge of wheelchair touching the front of the toilet and cues on managing pants prior to performing transfer.  Patient able to to perform with minA.  Then transferred back to the wheelchair scooting back with modA.  PT explained this might be a safer transfer her if she needs to toilet while out in public.  Patient is still able to toilet facing backwards as she was as long as her buttocks are over the water.  Patient and husband verbalized understanding and reports this would be very helpful. Sitting to partial stand pushing with bilateral lower extremities and bilateral upper extremities and have to lift buttocks off the wheelchair with weight on BLEs.  Patient performed 5 reps with minA and constant cueing   Therapeutic  Exercise: Nustep seat 9 level 5 with BLEs & BUEs 5 min 2 sets with timed 2 min rest bw sets. HR 92 SpO2 99%     PATIENT EDUCATION: PATIENT EDUCATED ON FOLLOWING  PROSTHETIC CARE: Education details: Use of stockinette under proximal liner to decrease itching sensation, no wounds presents to PT recommended not using Vive wear under liner Skin check, Prosthetic cleaning, Propper donning, and Proper wear schedule/adjustment Prosthetic wear tolerance: 3 hours 2x/day, 4 days/week Person educated: Patient and Spouse Education method: Explanation, Tactile cues, and Verbal cues Education comprehension: verbalized understanding, verbal cues required, tactile cues required, and needs further education  HOME EXERCISE PROGRAM: Access Code: [email or web address] URL: [email or web address] Date: 04/15/2023 Prepared by: Vladimir Faster  Exercises - Supine Bridge  - 1 x daily - 4 x weekly - 2 sets - 5 reps - 2 seconds hold - Supine Lower Trunk Rotation  - 1 x daily - 4 x weekly - 2 sets - 5 reps - 5 seconds hold - Supine Heel Slide with Strap  - 1 x daily - 4 x weekly - 2 sets - 5 reps - 2-3 seconds hold - Supine Hip Abduction  - 1 x daily - 4 x weekly - 2 sets - 5 reps - 2-3 seconds hold - Seated Hip Flexion Toward Target  - 1 x daily - 4 x weekly - 2 sets - 5 reps - 5 seconds hold - Seated Eccentric Abdominal Lean Back  - 1 x daily - 4 x weekly - 2 sets - 5 reps - 5 seconds hold - Seated Sidebending  - 1 x daily - 4 x weekly - 2 sets - 5 reps - 5 seconds hold - Seated Trunk Rotation with Crossed Arms  - 1 x daily - 4 x weekly - 2 sets - 5 reps - 5 seconds hold   ASSESSMENT:  CLINICAL IMPRESSION: Patient needed less assistance for transfers and bed mobility today.  Pt and husband appear to understand HEP.  Pt & husband seem need to communicate with transfers and other functional activities to enable pt to perform as much as she can herself.  Pt continues to benefit from skilled PT.   OBJECTIVE IMPAIRMENTS: Abnormal gait, decreased activity tolerance, decreased balance, decreased endurance, decreased knowledge of condition, decreased knowledge of use of DME,  decreased mobility, difficulty walking, decreased ROM, decreased strength, decreased safety awareness, postural dysfunction, prosthetic dependency , and pain.   ACTIVITY LIMITATIONS: standing, transfers, and locomotion level  PARTICIPATION LIMITATIONS: community activity, household mobility and dependency / burden of care on family  PERSONAL FACTORS: Age, Fitness, Past/current experiences, Time since onset of injury/illness/exacerbation, and 3+ comorbidities: see PMH  are also affecting patient's functional outcome.   REHAB POTENTIAL: Good  CLINICAL DECISION MAKING: Evolving/moderate complexity  EVALUATION COMPLEXITY: Moderate   GOALS: Goals reviewed with patient? Yes  SHORT TERM GOALS:  Target date: 04/22/2023:  Patient donnes prosthesis correctly with husband's assist & verbalizes proper cleaning. Baseline: SEE OBJECTIVE DATA Goal status: Ongoing 03/30/2023 2.  Patient tolerates prosthesis wear on non-dailysis days >8 hrs total /day and on dialysis days >2 hours/day without skin issues or limb pain >5/10. Baseline: SEE OBJECTIVE DATA Goal status: Ongoing 03/30/2023  3.  Patient able to stand with rolling walker with 1 person modA for 1 min.  Baseline: SEE OBJECTIVE DATA Goal status: Ongoing 03/30/2023  4. Patient ambulates 5' with RW & prosthesis with +2 maxA.  Baseline: SEE OBJECTIVE DATA Goal status: Ongoing 03/30/2023  5. Patient sit to/from stand transfer with +1 maxA and scooting transfer with modA. Baseline: SEE OBJECTIVE DATA Goal status: Ongoing 03/30/2023  LONG TERM GOALS: Target date: 06/17/2022  Patient & husband demonstrates & verbalizes understanding of prosthetic care to enable safe utilization of prosthesis. Baseline: SEE OBJECTIVE DATA Goal status: Ongoing 03/30/2023  Patient tolerates prosthesis wear >80% of awake hours on non-dialysis days and >50% on dialysis days without skin issues and limb pain </= 4/10. Baseline: SEE OBJECTIVE DATA Goal status: Ongoing  03/30/2023  Scooting transfers with minA and sit to/from stand transfers with modA.  Baseline: SEE OBJECTIVE DATA Goal status: Ongoing 03/30/2023  Patient ambulates >10' with prosthesis & RW with modA.  Baseline: SEE OBJECTIVE DATA Goal status: Ongoing 03/30/2023  Patient stands with RW support with minA or less for 2 min.  Baseline: SEE OBJECTIVE DATA Goal status:   Ongoing 03/30/2023   PLAN:  PT FREQUENCY: 2x/week  PT DURATION: 12 weeks  PLANNED INTERVENTIONS: 97164- PT Re-evaluation, 97110-Therapeutic exercises, 97530- Therapeutic activity, 97112- Neuromuscular re-education, [phone]- Self Care, 21308- Gait training, [phone]- Prosthetic training, Patient/Family education, Balance training, DME instructions, Therapeutic exercises, Therapeutic activity, Neuromuscular re-education, Gait training, and Self Care  PLAN FOR NEXT SESSION   check STGs next week, continue Nustep.  Core stabilization on bar stool & edge of bed.     Vladimir Faster, PT, DPT 04/15/2023, 4:06 PM

## 2023-04-17 ENCOUNTER — Other Ambulatory Visit: Payer: Self-pay | Admitting: Family Medicine

## 2023-04-17 DIAGNOSIS — R609 Edema, unspecified: Secondary | ICD-10-CM | POA: Diagnosis not present

## 2023-04-20 ENCOUNTER — Encounter: Payer: PPO | Admitting: Physical Therapy

## 2023-04-20 DIAGNOSIS — N186 End stage renal disease: Secondary | ICD-10-CM | POA: Diagnosis not present

## 2023-04-20 DIAGNOSIS — N2581 Secondary hyperparathyroidism of renal origin: Secondary | ICD-10-CM | POA: Diagnosis not present

## 2023-04-20 DIAGNOSIS — Z992 Dependence on renal dialysis: Secondary | ICD-10-CM | POA: Diagnosis not present

## 2023-04-20 DIAGNOSIS — D509 Iron deficiency anemia, unspecified: Secondary | ICD-10-CM | POA: Diagnosis not present

## 2023-04-20 DIAGNOSIS — D689 Coagulation defect, unspecified: Secondary | ICD-10-CM | POA: Diagnosis not present

## 2023-04-21 ENCOUNTER — Encounter: Payer: Self-pay | Admitting: Physical Therapy

## 2023-04-21 ENCOUNTER — Ambulatory Visit: Payer: PPO | Admitting: Physical Therapy

## 2023-04-21 ENCOUNTER — Telehealth (HOSPITAL_COMMUNITY): Payer: Self-pay | Admitting: *Deleted

## 2023-04-21 DIAGNOSIS — R2681 Unsteadiness on feet: Secondary | ICD-10-CM

## 2023-04-21 DIAGNOSIS — Z7409 Other reduced mobility: Secondary | ICD-10-CM | POA: Diagnosis not present

## 2023-04-21 DIAGNOSIS — M6281 Muscle weakness (generalized): Secondary | ICD-10-CM | POA: Diagnosis not present

## 2023-04-21 DIAGNOSIS — R2689 Other abnormalities of gait and mobility: Secondary | ICD-10-CM | POA: Diagnosis not present

## 2023-04-21 DIAGNOSIS — G546 Phantom limb syndrome with pain: Secondary | ICD-10-CM | POA: Diagnosis not present

## 2023-04-21 NOTE — Therapy (Signed)
OUTPATIENT PHYSICAL THERAPY PROSTHETIC TREATMENT   Patient Name: Susan [name] MRN: [id number] DOB:May 31, 1950, 72 y.o., female Today's Date: 04/21/2023  PCP: Donita Brooks, MD  REFERRING PROVIDER: Nadara Mustard, MD  END OF SESSION:  PT End of Session - 04/21/23 0927     Visit Number 8    Number of Visits 25    Date for PT Re-Evaluation 06/18/23    Authorization Type [employer]    Progress Note Due on Visit 10    PT Start Time 0927    PT Stop Time 1014    PT Time Calculation (min) 47 min    Equipment Utilized During Treatment Gait belt    Activity Tolerance Patient tolerated treatment well;Patient limited by fatigue    Behavior During Therapy WFL for tasks assessed/performed                    Past Medical History:  Diagnosis Date   Anemia    hx   Anxiety    Arthritis    "generalized" (03/15/2014)   CAD (coronary artery disease)    MI in 2000 - MI  2007 - treated bare metal stent (no nuclear since then as 9/11)   Carotid artery disease (HCC)    Chronic diastolic heart failure (HCC)    a) ECHO (08/2013) EF 55-60% and RV function nl b) RHC (08/2013) RA 4, RV 30/5/7, PA 25/10 (16), PCWP 7, Fick CO/CI 6.3/2.7, PVR 1.5 WU, PA 61 and 66%   Daily headache    "~ every other day; since I fell in June" (03/15/2014)   Depression    Diabetic retinopathy (HCC)    Dyslipidemia    ESRD (end stage renal disease) (HCC)    Dialysis on Tues Thurs Sat   Exertional shortness of breath    History of kidney stones    HTN (hypertension)    Hypothyroidism    Obesity    Osteoarthritis    PAF (paroxysmal atrial fibrillation) (HCC)    Peripheral neuropathy    bilateral feet/hands   PONV (postoperative nausea and vomiting)    RBBB (right bundle branch block)    Old   Stroke (HCC)    mini strokes   Type II diabetes mellitus (HCC)    Type II, [name]. patient has omnipod insulin pump with Novolin R Insulin   Past Surgical History:   Procedure Laterality Date   A/V FISTULAGRAM Left 11/07/2022   Procedure: A/V Fistulagram;  Surgeon: Leonie Douglas, MD;  [facility]: [facility];  Service: Cardiovascular;  Laterality: Left;   ABDOMINAL HYSTERECTOMY  1980's   AMPUTATION Right 02/24/2018   Procedure: RIGHT FOOT GREAT TOE AND 2ND TOE AMPUTATION;  Surgeon: Nadara Mustard, MD;  Location: [facility] OR;  Service: Orthopedics;  Laterality: Right;   AMPUTATION Right 04/30/2018   Procedure: RIGHT TRANSMETATARSAL AMPUTATION;  Surgeon: Nadara Mustard, MD;  Location: [facility] OR;  Service: Orthopedics;  Laterality: Right;   AMPUTATION Right 05/02/2022   Procedure: RIGHT BELOW KNEE AMPUTATION;  Surgeon: Nadara Mustard, MD;  Location: [facility] OR;  Service: Orthopedics;  Laterality: Right;   APPLICATION OF WOUND VAC Right 06/13/2022   Procedure: APPLICATION OF WOUND VAC;  Surgeon: Nadara Mustard, MD;  Location: [facility] OR;  Service: Orthopedics;  Laterality: Right;   APPLICATION OF WOUND VAC Left 11/14/2022   Procedure: APPLICATION OF WOUND VAC;  Surgeon: Nadara Mustard, MD;  Location: [facility] OR;  Service: Orthopedics;  Laterality:  Left;   AV FISTULA PLACEMENT Left 04/02/2022   Procedure: LEFT ARM ARTERIOVENOUS (AV) FISTULA CREATION;  Surgeon: Nada Libman, MD;  Location: [facility] OR;  Service: Vascular;  Laterality: Left;  PERIPHERAL NERVE BLOCK   BASCILIC VEIN TRANSPOSITION Left 07/31/2022   Procedure: LEFT ARM SECOND STAGE BASILIC VEIN TRANSPOSITION;  Surgeon: Nada Libman, MD;  Location: [facility] OR;  Service: Vascular;  Laterality: Left;   BIOPSY  05/27/2020   Procedure: BIOPSY;  Surgeon: Lanelle Bal, DO;  [facility]: [facility];  Service: Endoscopy;;   CATARACT EXTRACTION, BILATERAL Bilateral ?2013   COLONOSCOPY W/ POLYPECTOMY     COLONOSCOPY WITH PROPOFOL N/A 03/13/2019   Procedure: COLONOSCOPY WITH PROPOFOL;  Surgeon: Beverley Fiedler, MD;  Location: [facility] ENDOSCOPY;  Service: Gastroenterology;  Laterality: N/A;   CORONARY ANGIOPLASTY WITH STENT PLACEMENT  1999;  2007   "1 + 1"   ERCP N/A 02/03/2022   Procedure: ENDOSCOPIC RETROGRADE CHOLANGIOPANCREATOGRAPHY (ERCP);  Surgeon: Jeani Hawking, MD;  Location: [facility] ENDOSCOPY;  Service: Gastroenterology;  Laterality: N/A;   ESOPHAGOGASTRODUODENOSCOPY N/A 02/12/2023   Procedure: ESOPHAGOGASTRODUODENOSCOPY (EGD);  Surgeon: Jeani Hawking, MD;  Location: [facility] ENDOSCOPY;  Service: Gastroenterology;  Laterality: N/A;   ESOPHAGOGASTRODUODENOSCOPY (EGD) WITH PROPOFOL N/A 03/13/2019   Procedure: ESOPHAGOGASTRODUODENOSCOPY (EGD) WITH PROPOFOL;  Surgeon: Beverley Fiedler, MD;  Location: [facility] ENDOSCOPY;  Service: Gastroenterology;  Laterality: N/A;   ESOPHAGOGASTRODUODENOSCOPY (EGD) WITH PROPOFOL N/A 05/27/2020   Procedure: ESOPHAGOGASTRODUODENOSCOPY (EGD) WITH PROPOFOL;  Surgeon: Lanelle Bal, DO;  [facility];  Service: Endoscopy;  Laterality: N/A;   ESOPHAGOGASTRODUODENOSCOPY (EGD) WITH PROPOFOL N/A 09/03/2022   Procedure: ESOPHAGOGASTRODUODENOSCOPY (EGD) WITH PROPOFOL;  Surgeon: Jeani Hawking, MD;  Location: [facility] ENDOSCOPY;  Service: Gastroenterology;  Laterality: N/A;   EYE SURGERY Bilateral    lazer   FLEXIBLE SIGMOIDOSCOPY N/A 05/23/2022   Procedure: FLEXIBLE SIGMOIDOSCOPY;  Surgeon: Jeani Hawking, MD;  Location: [facility] ENDOSCOPY;  Service: Gastroenterology;  Laterality: N/A;   FLEXIBLE SIGMOIDOSCOPY N/A 05/24/2022   Procedure: FLEXIBLE SIGMOIDOSCOPY;  Surgeon: Imogene Burn, MD;  Location: [facility] ENDOSCOPY;  Service: Gastroenterology;  Laterality: N/A;   FLEXIBLE SIGMOIDOSCOPY N/A 09/03/2022   Procedure: FLEXIBLE SIGMOIDOSCOPY;  Surgeon: Jeani Hawking, MD;  Location: [facility] ENDOSCOPY;  Service: Gastroenterology;  Laterality: N/A;   HEMOSTASIS CLIP PLACEMENT  03/13/2019   Procedure: HEMOSTASIS CLIP PLACEMENT;  Surgeon: Beverley Fiedler, MD;  Location: [facility] ENDOSCOPY;  Service: Gastroenterology;;   HEMOSTASIS CLIP PLACEMENT  05/23/2022   Procedure: HEMOSTASIS CLIP PLACEMENT;  Surgeon: Jeani Hawking, MD;  Location: [facility] ENDOSCOPY;   Service: Gastroenterology;;   HEMOSTASIS CONTROL  05/24/2022   Procedure: HEMOSTASIS CONTROL;  Surgeon: Imogene Burn, MD;  Location: [facility] ENDOSCOPY;  Service: Gastroenterology;;   HOT HEMOSTASIS N/A 05/23/2022   Procedure: HOT HEMOSTASIS (ARGON PLASMA COAGULATION/BICAP);  Surgeon: Jeani Hawking, MD;  Location: [facility] ENDOSCOPY;  Service: Gastroenterology;  Laterality: N/A;   I & D EXTREMITY Left 05/05/2022   Procedure: IRRIGATION AND DEBRIDEMENT LEFT ARM AV FISTULA;  Surgeon: Cephus Shelling, MD;  Location: [facility] OR;  Service: Vascular;  Laterality: Left;   I & D EXTREMITY N/A 11/14/2022   Procedure: IRRIGATION AND DEBRIDEMENT OF LOWER EXTREMITY WOUND;  Surgeon: Nadara Mustard, MD;  [facility];  Service: Orthopedics;  Laterality: N/A;   INSERTION OF DIALYSIS CATHETER Right 04/02/2022   Procedure: INSERTION OF TUNNELED DIALYSIS CATHETER;  Surgeon: Nada Libman, MD;  Location[facility];  Service: Vascular;  Laterality: Right;   KNEE ARTHROSCOPY Left 10/25/2006   POLYPECTOMY  03/13/2019   Procedure: POLYPECTOMY;  Surgeon: Beverley Fiedler, MD;  Location: [facility] ENDOSCOPY;  Service: Gastroenterology;;   REMOVAL OF STONES  02/03/2022   Procedure: REMOVAL OF STONES;  Surgeon: Jeani Hawking, MD;  Location: [facility] ENDOSCOPY;  Service: Gastroenterology;;   REVISON OF ARTERIOVENOUS FISTULA Left 08/20/2022   Procedure: REVISON OF LEFT ARM ARTERIOVENOUS FISTULA;  Surgeon: Nada Libman, MD;  [facility];  Service: Vascular;  Laterality: Left;   RIGHT HEART CATH N/A 07/24/2017   Procedure: RIGHT HEART CATH;  Surgeon: Dolores Patty, MD;  [facility];  Service: Cardiovascular;  Laterality: N/A;   RIGHT HEART CATHETERIZATION N/A 09/22/2013   Procedure: RIGHT HEART CATH;  Surgeon: Dolores Patty, MD;  Location: [facility] CATH LAB;  Service: Cardiovascular;  Laterality: N/A;   SHOULDER ARTHROSCOPY WITH OPEN ROTATOR CUFF REPAIR Right 03/14/2014   Procedure: RIGHT SHOULDER ARTHROSCOPY WITH BICEPS  RELEASE, OPEN SUBSCAPULA REPAIR, OPEN SUPRASPINATUS REPAIR.;  Surgeon: Cammy Copa, MD;  Location[facility] OR;  Service: Orthopedics;  Laterality: Right;   SPHINCTEROTOMY  02/03/2022   Procedure: SPHINCTEROTOMY;  Surgeon: Jeani Hawking, MD;  Location: [facility] ENDOSCOPY;  Service: Gastroenterology;;   STUMP REVISION Right 06/13/2022   Procedure: REVISION RIGHT BELOW KNEE AMPUTATION;  Surgeon: Nadara Mustard, MD;  Location: [facility] OR;  Service: Orthopedics;  Laterality: Right;   TEE WITHOUT CARDIOVERSION N/A 02/04/2022   Procedure: TRANSESOPHAGEAL ECHOCARDIOGRAM (TEE);  Surgeon: Dolores Patty, MD;  Location: [facility] ENDOSCOPY;  Service: Cardiovascular;  Laterality: N/A;   THROMBECTOMY W/ EMBOLECTOMY Left 08/20/2022   Procedure: THROMBECTOMY OF LEFT ARM ARTERIOVENOUS FISTULA;  Surgeon: Nada Libman, MD;  Location: [facility] OR;  Service: Vascular;  Laterality: Left;   TOE AMPUTATION Right 02/24/2018   GREAT TOE AND 2ND TOE AMPUTATION   TUBAL LIGATION  1970's   Patient Active Problem List   Diagnosis Date Noted   Hyperosmolar hyperglycemic state (HHS) (HCC) 02/05/2023   Depression 01/29/2023   Nausea 01/24/2023   Intractable nausea and vomiting 01/20/2023   Proctitis 01/15/2023   Acute on chronic anemia 11/25/2022   Right below-knee amputee (HCC) 11/20/2022   Cellulitis of left lower extremity 11/14/2022   Maggot infestation 11/12/2022   Complicated wound infection 11/10/2022   Secondary hypercoagulable state (HCC) 09/04/2022   Right sided abdominal pain 08/31/2022   Constipation 06/07/2022   History of Clostridioides difficile colitis 06/06/2022   Below-knee amputation of right lower extremity (HCC) 06/06/2022   Diverticulitis 06/05/2022   Stercoral colitis 06/05/2022   C. difficile colitis 06/05/2022   Spleen hematoma 06/05/2022   Dehiscence of amputation stump of right lower extremity (HCC) 06/05/2022   Rectal ulcer 05/27/2022   ESRD (end stage renal disease) (HCC) 05/27/2022   GI bleed  05/23/2022   Difficult intravenous access 05/23/2022   Gangrene of right foot (HCC) 05/02/2022   S/P BKA (below knee amputation) unilateral, right (HCC) 05/02/2022   Unspecified protein-calorie malnutrition (HCC) 04/15/2022   Secondary hyperparathyroidism of renal origin (HCC) 04/14/2022   Coagulation defect, unspecified (HCC) 04/09/2022   Acquired absence of other left toe(s) (HCC) 04/07/2022   Allergy, unspecified, initial encounter 04/07/2022   Dependence on renal dialysis (HCC) 04/07/2022   Gout due to renal impairment, unspecified site 04/07/2022   Hypertensive heart and chronic kidney disease with heart failure and with stage 5 chronic kidney disease, or end stage renal disease (HCC) 04/07/2022   Personal history of transient ischemic attack (TIA), and cerebral infarction without residual deficits 04/07/2022   Renal osteodystrophy 04/07/2022  Venous stasis ulcer of right calf (HCC) 03/31/2022   Fistula, colovaginal 03/26/2022   Diarrhea 03/26/2022   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   Sepsis without acute organ dysfunction (HCC)    Bacteremia    Acute pancreatitis 02/01/2022   Abdominal pain 02/01/2022   SIRS (systemic inflammatory response syndrome) (HCC) 02/01/2022   Transaminitis 02/01/2022   History of anemia due to chronic kidney disease 02/01/2022   Paroxysmal atrial fibrillation (HCC) 02/01/2022   Uncontrolled type 2 diabetes mellitus with hyperglycemia, with long-term current use of insulin (HCC) 01/14/2022   NSTEMI (non-ST elevated myocardial infarction) (HCC) 03/05/2021   Acute renal failure superimposed on stage 4 chronic kidney disease (HCC) 08/22/2020   Hypoalbuminemia 05/25/2020   GERD (gastroesophageal reflux disease) 05/25/2020   Pressure injury of skin 05/17/2020   Acute on chronic combined systolic and diastolic congestive heart failure (HCC) 03/07/2020   Type 2 diabetes mellitus with diabetic polyneuropathy, with long-term current use of insulin (HCC)  03/07/2020   Obesity, Class III, BMI 40-49.9 (morbid obesity) (HCC) 03/07/2020   Common bile duct (CBD) obstruction 05/28/2019   Benign neoplasm of ascending colon    Benign neoplasm of transverse colon    Benign neoplasm of descending colon    Benign neoplasm of sigmoid colon    Gastric polyps    Hyperkalemia 03/11/2019   Prolonged QT interval 03/11/2019   Acute blood loss anemia 03/11/2019   Onychomycosis 06/21/2018   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)    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 (HCC)    PVD (peripheral vascular disease) (HCC) 10/26/2017   E-coli UTI 07/27/2017   Hypothyroidism 07/27/2017   AKI (acute kidney injury) (HCC)    PAH (pulmonary artery hypertension) (HCC)    Impaired ambulation 07/19/2017   Nausea & vomiting 07/15/2017   Leg cramps 02/27/2017   Peripheral edema 01/12/2017   Diabetic neuropathy (HCC) 11/12/2016   CKD (chronic kidney disease), stage IV (HCC) 10/24/2015   Anemia of chronic disease 10/03/2015   Historical diagnosis of generalized anxiety disorder 10/03/2015   Secondary insomnia 10/03/2015   Hyperglycemia due to diabetes mellitus (HCC) 06/07/2015   Non compliance with medical treatment 04/17/2014   Rotator cuff tear 03/14/2014   Obesity 09/23/2013   Chronic HFrEF (heart failure with reduced ejection fraction) (HCC) 06/03/2013   Hypotension 12/25/2012   Hyponatremia 12/25/2012   Urinary incontinence    MDD (major depressive disorder), recurrent episode, moderate (HCC) 11/12/2010   RBBB (right bundle branch block)    Wide-complex tachycardia    Coronary artery disease    Hyperlipemia 01/22/2009   Chronic hypotension 01/22/2009    ONSET DATE: 03/09/2023 MD referral to PT  REFERRING DIAG: S88.111D (ICD-10-CM) - Below-knee amputation of right lower extremity, subsequent encounter   THERAPY DIAG:  Unsteadiness on feet  Muscle weakness (generalized)  Phantom limb syndrome with pain (HCC)  Other  abnormalities of gait and mobility  Impaired functional mobility, balance, gait, and endurance  Rationale for Evaluation and Treatment: Rehabilitation  SUBJECTIVE:   SUBJECTIVE STATEMENT: She was able to do the exercises on the 2 days that she did not have dialysis since last PT session.  She wears prosthesis on Non-dialysis days all awake hours and dialysis days ~2 hours 1 of 3 days per week.   PERTINENT HISTORY: Right TTA 05/02/22, Lymphedema, idiopathic chronic venous HTN with ulcer, depression, colitis, diverticulitis, ESRD, gout, NSTEMI, DM2, polyneuropathy, PVD, CAD, right bundle branch block, mini strokes  PAIN:  Are you having pain?  Phantom pain with no known aggravation.   PRECAUTIONS: Fall and Other: No BP LUE  WEIGHT BEARING RESTRICTIONS: No  FALLS: Has patient fallen in last 6 months? No  LIVING ENVIRONMENT: Lives with: lives with their spouse and 2 small dogs Lives in: mobile home Home Access: Ramped entrance Home layout: One level Stairs: Yes: External: 6-7 steps; can reach both Has following equipment at home: Single point cane, Environmental consultant - 2 wheeled, Environmental consultant - 4 wheeled, Wheelchair (manual), [employer], Grab bars, and Ramped entry  OCCUPATION:  retired  PLOF: prior to amputation for ~year used RW in home & community  PATIENT GOALS:  to use prosthesis to walk, get out w/c in & out of car, get to bathroom.   OBJECTIVE:  COGNITION: Overall cognitive status:  Eval on 03/23/2023:   Within functional limits for tasks assessed  POSTURE:  Eval on 03/23/2023:  rounded shoulders, forward head, increased thoracic kyphosis, flexed trunk , and weight shift left  LOWER EXTREMITY ROM: ROM Right eval Left eval  Hip flexion    Hip extension    Hip abduction    Hip adduction    Hip internal rotation    Hip external rotation    Knee flexion    Knee extension    Ankle dorsiflexion    Ankle plantarflexion    Ankle inversion    Ankle eversion     (Blank rows = not  tested)  LOWER EXTREMITY MMT:  MMT Right eval Left eval  Hip flexion 3-/5 3-/5  Hip extension 2/5 2/5  Hip abduction 2+/5 2+/5  Hip adduction    Hip internal rotation    Hip external rotation    Knee flexion 3-/5 3-/5  Knee extension 3-/5 3-/5  Ankle dorsiflexion    Ankle plantarflexion    Ankle inversion    Ankle eversion    At Evaluation all strength testing is grossly seated and functionally standing / gait. (Blank rows = not tested)  TRANSFERS: 04/21/2023: Pt sit to stand w/c to //bar with one hand pushing on w/c and other hand on //bar with modA first time & MinA 2nd/ 3rd time.  Best is RUE on //bar & LUE on w/c.  Stand to sit reaching LUE to w/c with minA to control descent.   PT demo & verbal cues on sit to/from stand w/c to RW.  1st time modA 2 person. 2nd time maxA one person.  Stand to sit with minA and constant cueing. Scooting transfers with minA with w/c & mat direct contact & same ht.   Eval on 03/23/2023:  Sit to stand: Max A (two-person assist) from 22" w/c to rolling walker Stand to sit: Max A (two-person assist) rolling walker to wheelchair  FUNCTIONAL TESTs:   04/21/2023: Pt able to stand 60 sec with BUE on //bars with supervision.  Pt able to stand for 1 min with RW support with minA.  Eval on 03/23/2023: Patient maintained upright holding rolling walker with mod assist 2 people for safety for 30 seconds.  GAIT: 04/21/2023: Pt amb 5' in //bars with modA (2 person for safety) with PT verbal & tactile cues on sequence and weight shift.  Pt amb 10' with RW with maxA (2 person for safety) with PT verbal & tactile cues on sequence and weight shift.  Eval on 03/23/2023:  Patient took 2 steps (one per LE) with +2 max assist with rolling walker and TTA prosthesis.  Patient adducting prosthesis stepping on left foot with no awareness.  CURRENT  PROSTHETIC WEAR ASSESSMENT:  Eval on 03/23/2023:   Patient is dependent with: skin check, residual limb care,  prosthetic cleaning, ply sock cleaning, correct ply sock adjustment, proper wear schedule/adjustment, and proper weight-bearing schedule/adjustment Donning prosthesis: Max A Doffing prosthesis: Min A Prosthetic wear tolerance: 2-6 hours, 1x/day, 3-4 days/week Prosthetic weight bearing tolerance: 3 minutes with limb pain Edema: pitting edema Residual limb condition: No open areas, dry skin, normal color and temperature, cylindrical shape Prosthetic description: Silicone liner with pin lock suspension, total contact socket with flexible inner socket, sach foot    TODAY'S TREATMENT:                                                                                                                             DATE:  04/21/2023: Therapeutic Activities: Pt sit to stand w/c to //bar with one hand pushing on w/c and other hand on //bar with modA first time & MinA 2nd/ 3rd time.  Best is RUE on //bar & LUE on w/c.  Stand to sit reaching LUE to w/c with minA to control descent.   Pt able to stand 60 sec with BUE on //bars with supervision.  PT demo & verbal cues on sit to/from stand w/c to RW.  1st time modA 2 person. 2nd time maxA one person.  Stand to sit with minA and constant cueing. Pt able to stand for 1 min with RW support with minA.     Prosthetic Training with TTA prosthesis: Pt amb 5' in //bars with modA (2 person for safety) with PT verbal & tactile cues on sequence and weight shift.  Pt amb 10' with RW with maxA (2 person for safety) with PT verbal & tactile cues on sequence and weight shift.  PT recommended wearing prosthesis once home from dialysis until in bed. Pt & husband verbalized understanding.  PT demo & verbal cues on cutoff socks to snug fit distally. PT recommended appt with prosthetist to add pads to socket.  PT verbal cues on ply socks will change with pads.  Pt & husband verbalized understanding.  PT weighed prosthesis for dialysis. Prosthesis and liner weigh 2.5kg.  PT verbal  cues on need to subtract from weigh-in and weigh-out at dialysis.  She may want to remove prosthesis only (not liner & socks) once on dialysis recliner to get comfortable.  She should redon or have staff member don prior to transfer off the recliner.  Transfer set up with w/c & recliner touching so she can do scoot transfer. Pt & husband verbalized understanding.      04/15/2023: Therapeutic Activities: Pt able to scoot forward to edge of w/c and back into w/c with verbal cues only today.  Scooting transfer w/c <> level mat table with minA with cues on technique. Sit <> supine via right sidelying with minA/CGA trunk control & minA to move BLEs onto bed Supine to right sidelying with simulated bedrail with minA. Right sidelying to sit on edge  of bed with mod-minA.   Therapeutic  Exercise: Bridge 5 reps 2 sets Lower trunk rotation 5 reps 2 sets Heel slide with 18" ball & strap with active knee flexion and knee ext / leg press motion 5 reps 2 sets  Supine hip abduction with PT tactile cues to minimize ER 5 reps 2 sets Trunk motions for core stability without UE support but close supervision sitting at edge of mat with bil. Feet supported on floor: 5 reps ea trunk rotation right/left, controlled back lean with sit up with target 5" behind, forward controlled lean with sit up with target 5" anterior and lateral lean down on elbow with contralateral UE HHA.   PT issued HO with verbal and tactile cues for above as HEP on non-dialysis days. Pt & husband verbalized understanding.     04/13/2023: Therapeutic Activities: Scooting transfer w/c <> level mat table with modA with cues on technique. Pt appeared to participate more in transfer today.  Sit <> supine via right sidelying with minA trunk control & modA to move BLEs onto bed Supine to right sidelying with simulated bedrail with minA. Right sidelying to sit on edge of bed with mod-maxA.  PT cueing on bed mobility with recommendation to work on  getting in & out of bed on each side so one time pillow is on left side and next time pillow is on right side. Progress head of bed from ~45* to flat over time. Pt & husband verbalized understanding.   Therapeutic  Exercise: Bridge 5 reps 2 sets Lower trunk rotation 5 reps 2 sets Heel slide with 18" ball & strap with active knee flexion and knee ext / leg press motion 5 reps 2 sets  Supine hip abduction with PT tactile cues to minimize ER 5 reps 2 sets Trunk motions for core stability without UE support but close supervision sitting at edge of mat with bil. Feet supported on floor: 5 reps ea trunk rotation right/left, controlled back lean with sit up with target 5" behind, forward controlled lean with sit up with target 5" anterior and lateral lean down on elbow with contralateral UE HHA.   PT adding to HEP see [employer] below with HO, verbal & tactile cues.  Pt needs another session before trying at home.      PATIENT EDUCATION: PATIENT EDUCATED ON FOLLOWING PROSTHETIC CARE: Education details: Use of stockinette under proximal liner to decrease itching sensation, no wounds presents to PT recommended not using Vive wear under liner Skin check, Prosthetic cleaning, Propper donning, and Proper wear schedule/adjustment Prosthetic wear tolerance: 3 hours 2x/day, 4 days/week Person educated: Patient and Spouse Education method: Explanation, Tactile cues, and Verbal cues Education comprehension: verbalized understanding, verbal cues required, tactile cues required, and needs further education  HOME EXERCISE PROGRAM: Access Code: [email or web address] URL: [email or web address] Date: 04/15/2023 Prepared by: Vladimir Faster  Exercises - Supine Bridge  - 1 x daily - 4 x weekly - 2 sets - 5 reps - 2 seconds hold - Supine Lower Trunk Rotation  - 1 x daily - 4 x weekly - 2 sets - 5 reps - 5 seconds hold - Supine Heel Slide with Strap  - 1 x daily - 4 x weekly - 2 sets - 5 reps - 2-3 seconds hold -  Supine Hip Abduction  - 1 x daily - 4 x weekly - 2 sets - 5 reps - 2-3 seconds hold - Seated Hip Flexion Toward Target  - 1 x daily - 4 x weekly -  2 sets - 5 reps - 5 seconds hold - Seated Eccentric Abdominal Lean Back  - 1 x daily - 4 x weekly - 2 sets - 5 reps - 5 seconds hold - Seated Sidebending  - 1 x daily - 4 x weekly - 2 sets - 5 reps - 5 seconds hold - Seated Trunk Rotation with Crossed Arms  - 1 x daily - 4 x weekly - 2 sets - 5 reps - 5 seconds hold   ASSESSMENT:  CLINICAL IMPRESSION: Patient met all STGs.  Pt and husband both report that they can see a big difference in her ability to transfer and stand at sink with the prosthesis.  Pt feels that PT is very helpful for her functional skills.   Pt continues to benefit from skilled PT.   OBJECTIVE IMPAIRMENTS: Abnormal gait, decreased activity tolerance, decreased balance, decreased endurance, decreased knowledge of condition, decreased knowledge of use of DME, decreased mobility, difficulty walking, decreased ROM, decreased strength, decreased safety awareness, postural dysfunction, prosthetic dependency , and pain.   ACTIVITY LIMITATIONS: standing, transfers, and locomotion level  PARTICIPATION LIMITATIONS: community activity, household mobility and dependency / burden of care on family  PERSONAL FACTORS: Age, Fitness, Past/current experiences, Time since onset of injury/illness/exacerbation, and 3+ comorbidities: see PMH  are also affecting patient's functional outcome.   REHAB POTENTIAL: Good  CLINICAL DECISION MAKING: Evolving/moderate complexity  EVALUATION COMPLEXITY: Moderate   GOALS: Goals reviewed with patient? Yes  SHORT TERM GOALS: Target date: 04/22/2023:  Patient donnes prosthesis correctly with husband's assist & verbalizes proper cleaning. Baseline: SEE OBJECTIVE DATA Goal status:   MET 04/21/2023 2.  Patient tolerates prosthesis wear on non-dailysis days >8 hrs total /day and on dialysis days >2 hours/day  without skin issues or limb pain >5/10. Baseline: SEE OBJECTIVE DATA Goal status:  partially MET 04/21/2023  3.  Patient able to stand with rolling walker with 1 person modA for 1 min.  Baseline: SEE OBJECTIVE DATA Goal status: MET 04/21/2023  4. Patient ambulates 5' with RW & prosthesis with +2 maxA.  Baseline: SEE OBJECTIVE DATA Goal status: MET 04/21/2023  5. Patient sit to/from stand transfer with +1 maxA and scooting transfer with modA. Baseline: SEE OBJECTIVE DATA Goal status: MET 04/21/2023  LONG TERM GOALS: Target date: 06/17/2022  Patient & husband demonstrates & verbalizes understanding of prosthetic care to enable safe utilization of prosthesis. Baseline: SEE OBJECTIVE DATA Goal status: Ongoing 03/30/2023  Patient tolerates prosthesis wear >80% of awake hours on non-dialysis days and >50% on dialysis days without skin issues and limb pain </= 4/10. Baseline: SEE OBJECTIVE DATA Goal status: Ongoing 03/30/2023  Scooting transfers with minA and sit to/from stand transfers with modA.  Baseline: SEE OBJECTIVE DATA Goal status: Ongoing 03/30/2023  Patient ambulates >10' with prosthesis & RW with modA.  Baseline: SEE OBJECTIVE DATA Goal status: Ongoing 03/30/2023  Patient stands with RW support with minA or less for 2 min.  Baseline: SEE OBJECTIVE DATA Goal status:   Ongoing 03/30/2023   PLAN:  PT FREQUENCY: 2x/week  PT DURATION: 12 weeks  PLANNED INTERVENTIONS: 97164- PT Re-evaluation, 97110-Therapeutic exercises, 97530- Therapeutic activity, 97112- Neuromuscular re-education, [phone]- Self Care, 60454- Gait training, [phone]- Prosthetic training, Patient/Family education, Balance training, DME instructions, Therapeutic exercises, Therapeutic activity, Neuromuscular re-education, Gait training, and Self Care  PLAN FOR NEXT SESSION   prosthetic gait & standing with RW, continue Nustep.  Core stabilization on bar stool & edge of bed.     Vladimir Faster, PT, DPT  04/21/2023,  10:34 AM

## 2023-04-21 NOTE — Telephone Encounter (Signed)
Left message on voicemail in reference to upcoming appointment scheduled for  04/29/23 Phone number given for a call back so details instructions can be given. Ricky Ala

## 2023-04-22 ENCOUNTER — Encounter: Payer: PPO | Admitting: Physical Therapy

## 2023-04-22 ENCOUNTER — Encounter (HOSPITAL_COMMUNITY): Payer: Self-pay | Admitting: Vascular Surgery

## 2023-04-22 ENCOUNTER — Other Ambulatory Visit: Payer: Self-pay

## 2023-04-22 NOTE — Progress Notes (Signed)
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   Last seen by cardiology APP Prince Rome, FNP on 04/13/2023.  Per note, "Today she returns for HF follow up with her husband. Overall feeling fair. She is working with PT, struggling to walk on her prosthesis. She has SOB working with PT, wears 3 L oxygen at night. She has rare atypical chest pain. She has swelling in her leg. Denies palpitations, dizziness, abnormal bleeding or PND/Orthopnea. Appetite ok. No fever or chills. Taking all medications. Dialysis on T/Th/Sat. She makes a little urine. Required midodrine at dialysis, takes her Coreg before dialysis."    Most recent echo 08/30/2022 showed EF 30 to 35%, RV systolic function mildly reduced, no significant valvular abnormalities.  IDDM2, most recent A1c 9.0 on 02/27/2023.  She will need day of surgery labs and evaluation.  EKG 04/13/2023: Normal sinus rhythm 69. Left axis deviation. Right bundle branch block. Left ventricular hypertrophy with repolarization abnormality ( R in aVL ). Cannot rule out Septal infarct (cited on or before 12-Feb-2023)  TTE 08/30/2022: 1. Global hypokinesis with apical akinesis; overall moderate to severe LV  dysfunction.   2. Left  ventricular ejection fraction, by estimation, is 30 to 35%. The  left ventricle has moderate to severely decreased function. The left  ventricle demonstrates regional wall motion abnormalities (see scoring  diagram/findings for description). There  is mild left ventricular hypertrophy. Left ventricular diastolic  parameters are indeterminate. Elevated left atrial pressure.   3. Right ventricular systolic function is mildly reduced. The right  ventricular size is moderately enlarged. There is normal pulmonary artery  systolic pressure.   4. Left atrial size was moderately dilated.   5. Right atrial size was mildly dilated.   6. Moderate pleural effusion in the left lateral region.   7. The mitral valve is normal in structure. No evidence of mitral valve  regurgitation. No evidence of mitral stenosis.   8. Tricuspid valve regurgitation is moderate.   9. The aortic valve is normal in structure. Aortic valve regurgitation is  not visualized. Aortic valve sclerosis/calcification is present, without  any evidence of aortic stenosis.  10. The inferior vena cava is normal in size with greater than 50%  respiratory variability, suggesting right atrial pressure of 3 mmHg.     Zannie Cove [facility]/Anesthesiology Phone [phone] 04/22/2023 9:14 AM

## 2023-04-22 NOTE — Progress Notes (Signed)
[name] denies shortness of breath at rest.  Patient reports that she had chest pain ~ 2 weeks ago left chest, no sob, N/V ,felt a little diaphoraic. Ptaient took a NTG, pain went away.  It was a sharp pain on the left side of chest, lasted a few seconds. I read in a note from cardiology that patient experiences chest pain at times- Mrs Riccardi cannot identify a causative factor. Patient denies having any s/s of Covid in her household, also denies any known exposure to Covid.   PCP is Dr. Lynnea Ferrier, cardiolgist is Dr. Mallie Snooks.  Mrs. Stucky has type II diabetes, patient wears a continuous glucose monitor.  Patient reports that CBG is runs an average of 200. Last A1C  drawn on 02/27/23 was 9.0. I instructed patient if CBG is greater that 220 to take 1/2 of sliding scale Hemology Insulin.  I   I Instructed patient if CBG is less than 70 to take 4 Glucose Tablets or 1 tube of Glucose Gel or 1/2 cup of a clear juice. Recheck CBG in 15 minutes if CBG is not over 70 call, pre- op desk at [phone] for further instructions. If scheduled to receive Insulin, do not take Insulin  If CBG is greater than 70, take 1/2 of Lantus dose- 15 units.

## 2023-04-22 NOTE — Anesthesia Preprocedure Evaluation (Addendum)
Anesthesia Evaluation  Patient identified by MRN, date of birth, ID band Patient awake    Reviewed: Allergy & Precautions, NPO status , Patient's Chart, lab work & pertinent test results, reviewed documented beta blocker date and time   History of Anesthesia Complications (+) PONV and history of anesthetic complications  Airway Mallampati: III  TM Distance: >3 FB Neck ROM: Limited    Dental  (+) Dental Advisory Given   Pulmonary former smoker  Nocturnal O2 use    Pulmonary exam normal        Cardiovascular hypertension, Pt. on home beta blockers and Pt. on medications pulmonary hypertension+ CAD, + Past MI, + Cardiac Stents, + Peripheral Vascular Disease and +CHF  Normal cardiovascular exam+ Valvular Problems/Murmurs    '24 TTE - Global hypokinesis with apical akinesis; overall moderate to severe LV  dysfunction. EF 30-35%. There is mild left ventricular hypertrophy. Right ventricular systolic function is mildly reduced. The right ventricular size is moderately enlarged. Left atrial size was moderately dilated. Right atrial size was mildly dilated. Moderate pleural effusion in the left lateral region. Moderate TR.    Neuro/Psych  Headaches PSYCHIATRIC DISORDERS Anxiety Depression     Neuromuscular disease CVA, No Residual Symptoms    GI/Hepatic Neg liver ROS, PUD,GERD  Controlled,,  Endo/Other  diabetes, Poorly Controlled, Type 2, Insulin DependentHypothyroidism  Class 3 obesity  Renal/GU ESRFRenal disease     Musculoskeletal  (+) Arthritis , Osteoarthritis,    Abdominal   Peds  Hematology  On eliquis    Anesthesia Other Findings   Reproductive/Obstetrics                             Anesthesia Physical Anesthesia Plan  ASA: 4  Anesthesia Plan: Regional   Post-op Pain Management: Regional block* and Tylenol PO (pre-op)*   Induction:   PONV Risk Score and Plan: 3 and Propofol  infusion and Treatment may vary due to age or medical condition  Airway Management Planned: Natural Airway and Simple Face Mask  Additional Equipment: None  Intra-op Plan:   Post-operative Plan:   Informed Consent: I have reviewed the patients History and Physical, chart, labs and discussed the procedure including the risks, benefits and alternatives for the proposed anesthesia with the patient or authorized representative who has indicated his/her understanding and acceptance.       Plan Discussed with: CRNA and Anesthesiologist  Anesthesia Plan Comments: (PAT note by Antionette Poles, PA-C. Block/MAC vs LMA )        Anesthesia Quick Evaluation

## 2023-04-25 DIAGNOSIS — N179 Acute kidney failure, unspecified: Secondary | ICD-10-CM | POA: Diagnosis not present

## 2023-04-25 DIAGNOSIS — M6281 Muscle weakness (generalized): Secondary | ICD-10-CM | POA: Diagnosis not present

## 2023-04-25 DIAGNOSIS — N186 End stage renal disease: Secondary | ICD-10-CM | POA: Diagnosis not present

## 2023-04-25 DIAGNOSIS — Z992 Dependence on renal dialysis: Secondary | ICD-10-CM | POA: Diagnosis not present

## 2023-04-25 DIAGNOSIS — K626 Ulcer of anus and rectum: Secondary | ICD-10-CM | POA: Diagnosis not present

## 2023-04-25 DIAGNOSIS — I5043 Acute on chronic combined systolic (congestive) and diastolic (congestive) heart failure: Secondary | ICD-10-CM | POA: Diagnosis not present

## 2023-04-25 DIAGNOSIS — K219 Gastro-esophageal reflux disease without esophagitis: Secondary | ICD-10-CM | POA: Diagnosis not present

## 2023-04-25 DIAGNOSIS — J9611 Chronic respiratory failure with hypoxia: Secondary | ICD-10-CM | POA: Diagnosis not present

## 2023-04-25 DIAGNOSIS — I5042 Chronic combined systolic (congestive) and diastolic (congestive) heart failure: Secondary | ICD-10-CM | POA: Diagnosis not present

## 2023-04-27 ENCOUNTER — Encounter (HOSPITAL_COMMUNITY)
Admission: RE | Disposition: A | Discharge status: Home / Self Care | Payer: Self-pay | Source: Ambulatory Visit | Attending: Vascular Surgery

## 2023-04-27 ENCOUNTER — Ambulatory Visit (HOSPITAL_COMMUNITY): Payer: PPO | Admitting: Physician Assistant

## 2023-04-27 ENCOUNTER — Other Ambulatory Visit (HOSPITAL_COMMUNITY): Payer: Self-pay

## 2023-04-27 ENCOUNTER — Ambulatory Visit (HOSPITAL_COMMUNITY)
Admission: RE | Admit: 2023-04-27 | Discharge: 2023-04-27 | Disposition: A | Discharge status: Home / Self Care | Payer: PPO | Source: Ambulatory Visit | Attending: Vascular Surgery | Admitting: Vascular Surgery

## 2023-04-27 ENCOUNTER — Other Ambulatory Visit: Payer: Self-pay

## 2023-04-27 ENCOUNTER — Encounter (HOSPITAL_COMMUNITY): Payer: Self-pay | Admitting: Vascular Surgery

## 2023-04-27 ENCOUNTER — Encounter: Payer: PPO | Admitting: Physical Therapy

## 2023-04-27 DIAGNOSIS — N184 Chronic kidney disease, stage 4 (severe): Secondary | ICD-10-CM | POA: Diagnosis not present

## 2023-04-27 DIAGNOSIS — I252 Old myocardial infarction: Secondary | ICD-10-CM | POA: Diagnosis not present

## 2023-04-27 DIAGNOSIS — I5032 Chronic diastolic (congestive) heart failure: Secondary | ICD-10-CM | POA: Insufficient documentation

## 2023-04-27 DIAGNOSIS — E1122 Type 2 diabetes mellitus with diabetic chronic kidney disease: Secondary | ICD-10-CM | POA: Insufficient documentation

## 2023-04-27 DIAGNOSIS — N186 End stage renal disease: Secondary | ICD-10-CM

## 2023-04-27 DIAGNOSIS — I132 Hypertensive heart and chronic kidney disease with heart failure and with stage 5 chronic kidney disease, or end stage renal disease: Secondary | ICD-10-CM | POA: Diagnosis not present

## 2023-04-27 DIAGNOSIS — I509 Heart failure, unspecified: Secondary | ICD-10-CM | POA: Diagnosis not present

## 2023-04-27 DIAGNOSIS — Z992 Dependence on renal dialysis: Secondary | ICD-10-CM

## 2023-04-27 DIAGNOSIS — E1151 Type 2 diabetes mellitus with diabetic peripheral angiopathy without gangrene: Secondary | ICD-10-CM | POA: Diagnosis not present

## 2023-04-27 DIAGNOSIS — Z87891 Personal history of nicotine dependence: Secondary | ICD-10-CM | POA: Diagnosis not present

## 2023-04-27 DIAGNOSIS — Z955 Presence of coronary angioplasty implant and graft: Secondary | ICD-10-CM | POA: Insufficient documentation

## 2023-04-27 DIAGNOSIS — I120 Hypertensive chronic kidney disease with stage 5 chronic kidney disease or end stage renal disease: Secondary | ICD-10-CM

## 2023-04-27 DIAGNOSIS — N185 Chronic kidney disease, stage 5: Secondary | ICD-10-CM

## 2023-04-27 DIAGNOSIS — I129 Hypertensive chronic kidney disease with stage 1 through stage 4 chronic kidney disease, or unspecified chronic kidney disease: Secondary | ICD-10-CM | POA: Diagnosis not present

## 2023-04-27 DIAGNOSIS — Z794 Long term (current) use of insulin: Secondary | ICD-10-CM | POA: Insufficient documentation

## 2023-04-27 DIAGNOSIS — I2510 Atherosclerotic heart disease of native coronary artery without angina pectoris: Secondary | ICD-10-CM | POA: Diagnosis not present

## 2023-04-27 HISTORY — DX: Acute myocardial infarction, unspecified: I21.9

## 2023-04-27 HISTORY — DX: Personal history of other medical treatment: Z92.89

## 2023-04-27 HISTORY — DX: Gastro-esophageal reflux disease without esophagitis: K21.9

## 2023-04-27 HISTORY — PX: AV FISTULA PLACEMENT: SHX1204

## 2023-04-27 LAB — POCT I-STAT, CHEM 8
BUN: 40 mg/dL — ABNORMAL HIGH (ref 8–23)
Calcium, Ion: 1.18 mmol/L (ref 1.15–1.40)
Chloride: 95 mmol/L — ABNORMAL LOW (ref 98–111)
Creatinine, Ser: 5.3 mg/dL — ABNORMAL HIGH (ref 0.44–1.00)
Glucose, Bld: 227 mg/dL — ABNORMAL HIGH (ref 70–99)
HCT: 31 % — ABNORMAL LOW (ref 36.0–46.0)
Hemoglobin: 10.5 g/dL — ABNORMAL LOW (ref 12.0–15.0)
Potassium: 3.2 mmol/L — ABNORMAL LOW (ref 3.5–5.1)
Sodium: 133 mmol/L — ABNORMAL LOW (ref 135–145)
TCO2: 26 mmol/L (ref 22–32)

## 2023-04-27 LAB — GLUCOSE, CAPILLARY
Glucose-Capillary: 213 mg/dL — ABNORMAL HIGH (ref 70–99)
Glucose-Capillary: 225 mg/dL — ABNORMAL HIGH (ref 70–99)

## 2023-04-27 LAB — SURGICAL PCR SCREEN
MRSA, PCR: NEGATIVE
Staphylococcus aureus: NEGATIVE

## 2023-04-27 SURGERY — ARTERIOVENOUS (AV) FISTULA CREATION
Anesthesia: Regional | Site: Arm Upper | Laterality: Right

## 2023-04-27 MED ORDER — CHLORHEXIDINE GLUCONATE 4 % EX SOLN: 60.0000 mL | Freq: Once | CUTANEOUS | Status: DC

## 2023-04-27 MED ORDER — HEPARIN SODIUM (PORCINE) 1000 UNIT/ML IJ SOLN: 1000.0000 [IU] | INTRAMUSCULAR | Status: DC | PRN

## 2023-04-27 MED ORDER — HEPARIN SODIUM (PORCINE) 1000 UNIT/ML IJ SOLN
INTRAMUSCULAR | Status: AC
  Filled 2023-04-27: qty 1

## 2023-04-27 MED ORDER — ORAL CARE MOUTH RINSE: 15.0000 mL | Freq: Once | OROMUCOSAL | Status: AC

## 2023-04-27 MED ORDER — SODIUM CHLORIDE 0.9% FLUSH: 10.0000 mL | Freq: Two times a day (BID) | INTRAVENOUS | Status: DC

## 2023-04-27 MED ORDER — MIDAZOLAM HCL 5 MG/5ML IJ SOLN
INTRAMUSCULAR | Status: DC | PRN
  Administered 2023-04-27: 1 mg via INTRAVENOUS

## 2023-04-27 MED ORDER — PHENYLEPHRINE HCL-NACL 20-0.9 MG/250ML-% IV SOLN
INTRAVENOUS | Status: DC | PRN
  Administered 2023-04-27: 30 ug/min via INTRAVENOUS

## 2023-04-27 MED ORDER — HEPARIN 6000 UNIT IRRIGATION SOLUTION
Status: DC | PRN
  Administered 2023-04-27: 1

## 2023-04-27 MED ORDER — PROPOFOL 10 MG/ML IV BOLUS
INTRAVENOUS | Status: AC
  Filled 2023-04-27: qty 20

## 2023-04-27 MED ORDER — PROPOFOL 500 MG/50ML IV EMUL
INTRAVENOUS | Status: DC | PRN
  Administered 2023-04-27: 85 ug/kg/min via INTRAVENOUS

## 2023-04-27 MED ORDER — ACETAMINOPHEN 500 MG PO TABS
ORAL_TABLET | ORAL | Status: AC
  Administered 2023-04-27: 1000 mg via ORAL
  Filled 2023-04-27: qty 2

## 2023-04-27 MED ORDER — PHENYLEPHRINE HCL-NACL 20-0.9 MG/250ML-% IV SOLN
INTRAVENOUS | Status: AC
  Filled 2023-04-27: qty 750

## 2023-04-27 MED ORDER — HEPARIN SOD (PORK) LOCK FLUSH 100 UNIT/ML IV SOLN: 1000.0000 [IU] | INTRAVENOUS | Status: DC | PRN

## 2023-04-27 MED ORDER — MIDAZOLAM HCL 2 MG/2ML IJ SOLN
INTRAMUSCULAR | Status: AC
  Filled 2023-04-27: qty 2

## 2023-04-27 MED ORDER — FENTANYL CITRATE (PF) 100 MCG/2ML IJ SOLN: 25.0000 ug | INTRAMUSCULAR | Status: DC | PRN

## 2023-04-27 MED ORDER — SODIUM CHLORIDE 0.9 % IV SOLN: INTRAVENOUS | Status: DC

## 2023-04-27 MED ORDER — OXYCODONE-ACETAMINOPHEN 5-325 MG PO TABS
1.0000 | ORAL_TABLET | Freq: Four times a day (QID) | ORAL | 0 refills | Status: DC | PRN
  Filled 2023-04-27: qty 20, 5d supply, fill #0

## 2023-04-27 MED ORDER — HEPARIN SODIUM (PORCINE) 1000 UNIT/ML IJ SOLN
INTRAMUSCULAR | Status: AC
  Filled 2023-04-27: qty 2

## 2023-04-27 MED ORDER — HEPARIN SODIUM (PORCINE) 1000 UNIT/ML IJ SOLN
1000.0000 [IU] | INTRAMUSCULAR | Status: DC | PRN
  Administered 2023-04-27: 2000 [IU]

## 2023-04-27 MED ORDER — CHLORHEXIDINE GLUCONATE CLOTH 2 % EX PADS: 6.0000 | MEDICATED_PAD | Freq: Every day | CUTANEOUS | Status: DC

## 2023-04-27 MED ORDER — CHLORHEXIDINE GLUCONATE 0.12 % MT SOLN: 15.0000 mL | Freq: Once | OROMUCOSAL | Status: AC

## 2023-04-27 MED ORDER — MEPIVACAINE HCL (PF) 1.5 % IJ SOLN
INTRAMUSCULAR | Status: DC | PRN
Start: 2023-04-27 — End: 2023-04-27
  Administered 2023-04-27: 20 mL via PERINEURAL

## 2023-04-27 MED ORDER — LIDOCAINE 2% (20 MG/ML) 5 ML SYRINGE
INTRAMUSCULAR | Status: AC
  Filled 2023-04-27: qty 5

## 2023-04-27 MED ORDER — HEPARIN 6000 UNIT IRRIGATION SOLUTION
Status: AC
  Filled 2023-04-27: qty 500

## 2023-04-27 MED ORDER — ACETAMINOPHEN 500 MG PO TABS: 1000.0000 mg | ORAL_TABLET | Freq: Once | ORAL | Status: AC

## 2023-04-27 MED ORDER — PHENYLEPHRINE 80 MCG/ML (10ML) SYRINGE FOR IV PUSH (FOR BLOOD PRESSURE SUPPORT)
PREFILLED_SYRINGE | INTRAVENOUS | Status: DC | PRN
  Administered 2023-04-27: 80 ug via INTRAVENOUS

## 2023-04-27 MED ORDER — DEXAMETHASONE SODIUM PHOSPHATE 10 MG/ML IJ SOLN
INTRAMUSCULAR | Status: AC
  Filled 2023-04-27: qty 1

## 2023-04-27 MED ORDER — ONDANSETRON HCL 4 MG/2ML IJ SOLN: 4.0000 mg | Freq: Once | INTRAMUSCULAR | Status: DC | PRN

## 2023-04-27 MED ORDER — VANCOMYCIN HCL 1.5 G IV SOLR
1500.0000 mg | INTRAVENOUS | Status: AC
  Administered 2023-04-27: 1500 mg via INTRAVENOUS
  Filled 2023-04-27: qty 30

## 2023-04-27 MED ORDER — STERILE WATER FOR IRRIGATION IR SOLN
Status: DC | PRN
  Administered 2023-04-27: 1000 mL

## 2023-04-27 MED ORDER — ONDANSETRON HCL 4 MG/2ML IJ SOLN
INTRAMUSCULAR | Status: DC | PRN
  Administered 2023-04-27: 4 mg via INTRAVENOUS

## 2023-04-27 MED ORDER — FENTANYL CITRATE (PF) 250 MCG/5ML IJ SOLN
INTRAMUSCULAR | Status: AC
  Filled 2023-04-27: qty 5

## 2023-04-27 MED ORDER — 0.9 % SODIUM CHLORIDE (POUR BTL) OPTIME
TOPICAL | Status: DC | PRN
  Administered 2023-04-27: 1000 mL

## 2023-04-27 MED ORDER — SODIUM CHLORIDE 0.9% FLUSH
10.0000 mL | Freq: Two times a day (BID) | INTRAVENOUS | Status: DC
Start: 2023-04-27 — End: 2023-04-27

## 2023-04-27 MED ORDER — CHLORHEXIDINE GLUCONATE 0.12 % MT SOLN
OROMUCOSAL | Status: AC
  Administered 2023-04-27: 15 mL via OROMUCOSAL
  Filled 2023-04-27: qty 15

## 2023-04-27 MED ORDER — FENTANYL CITRATE (PF) 250 MCG/5ML IJ SOLN
INTRAMUSCULAR | Status: DC | PRN
  Administered 2023-04-27: 50 ug via INTRAVENOUS

## 2023-04-27 MED ORDER — INSULIN ASPART 100 UNIT/ML IJ SOLN
0.0000 [IU] | INTRAMUSCULAR | Status: DC | PRN
  Administered 2023-04-27: 3 [IU] via SUBCUTANEOUS
  Filled 2023-04-27: qty 1

## 2023-04-27 MED ORDER — PROPOFOL 10 MG/ML IV BOLUS
INTRAVENOUS | Status: DC | PRN
  Administered 2023-04-27: 20 mg via INTRAVENOUS
  Administered 2023-04-27: 30 mg via INTRAVENOUS
  Administered 2023-04-27: 20 mg via INTRAVENOUS

## 2023-04-27 MED ORDER — OXYCODONE HCL 5 MG PO TABS: 5.0000 mg | ORAL_TABLET | Freq: Once | ORAL | Status: DC | PRN

## 2023-04-27 MED ORDER — ONDANSETRON HCL 4 MG/2ML IJ SOLN
INTRAMUSCULAR | Status: AC
  Filled 2023-04-27: qty 2

## 2023-04-27 MED ORDER — OXYCODONE HCL 5 MG/5ML PO SOLN: 5.0000 mg | Freq: Once | ORAL | Status: DC | PRN

## 2023-04-27 MED ORDER — SODIUM CHLORIDE 0.9% FLUSH: 10.0000 mL | INTRAVENOUS | Status: DC | PRN

## 2023-04-27 SURGICAL SUPPLY — 35 items
ARMBAND PINK RESTRICT EXTREMIT (MISCELLANEOUS) ×1 IMPLANT
BENZOIN TINCTURE PRP APPL 2/3 (GAUZE/BANDAGES/DRESSINGS) ×1 IMPLANT
CANISTER SUCT 3000ML PPV (MISCELLANEOUS) ×1 IMPLANT
CANNULA VESSEL 3MM 2 BLNT TIP (CANNULA) ×1 IMPLANT
CHLORAPREP W/TINT 26 (MISCELLANEOUS) ×1 IMPLANT
CLIP LIGATING EXTRA MED SLVR (CLIP) ×1 IMPLANT
CLIP LIGATING EXTRA SM BLUE (MISCELLANEOUS) ×1 IMPLANT
CLSR STERI-STRIP ANTIMIC 1/2X4 (GAUZE/BANDAGES/DRESSINGS) IMPLANT
COVER PROBE W GEL 5X96 (DRAPES) IMPLANT
DRSG IV TEGADERM 3.5X4.5 STRL (GAUZE/BANDAGES/DRESSINGS) IMPLANT
ELECT REM PT RETURN 9FT ADLT (ELECTROSURGICAL) ×1
ELECTRODE REM PT RTRN 9FT ADLT (ELECTROSURGICAL) ×1 IMPLANT
GAUZE SPONGE 4X4 12PLY STRL LF (GAUZE/BANDAGES/DRESSINGS) IMPLANT
GLOVE BIO SURGEON STRL SZ8 (GLOVE) ×1 IMPLANT
GOWN STRL REUS W/ TWL LRG LVL3 (GOWN DISPOSABLE) ×2 IMPLANT
GOWN STRL REUS W/ TWL XL LVL3 (GOWN DISPOSABLE) ×1 IMPLANT
INSERT FOGARTY SM (MISCELLANEOUS) IMPLANT
KIT BASIN OR (CUSTOM PROCEDURE TRAY) ×1 IMPLANT
KIT TURNOVER KIT B (KITS) ×1 IMPLANT
LOOP VESSEL MINI RED (MISCELLANEOUS) IMPLANT
NDL 18GX1X1/2 (RX/OR ONLY) (NEEDLE) IMPLANT
NEEDLE 18GX1X1/2 (RX/OR ONLY) (NEEDLE) IMPLANT
NS IRRIG 1000ML POUR BTL (IV SOLUTION) ×1 IMPLANT
PACK CV ACCESS (CUSTOM PROCEDURE TRAY) ×1 IMPLANT
PAD ARMBOARD 7.5X6 YLW CONV (MISCELLANEOUS) ×2 IMPLANT
SLING ARM FOAM STRAP LRG (SOFTGOODS) IMPLANT
SLING ARM FOAM STRAP MED (SOFTGOODS) IMPLANT
STRIP CLOSURE SKIN 1/2X4 (GAUZE/BANDAGES/DRESSINGS) ×1 IMPLANT
SUT MNCRL AB 4-0 PS2 18 (SUTURE) ×1 IMPLANT
SUT PROLENE 6 0 BV (SUTURE) ×1 IMPLANT
SUT VIC AB 3-0 SH 27X BRD (SUTURE) ×1 IMPLANT
SYR 3ML LL SCALE MARK (SYRINGE) IMPLANT
TOWEL GREEN STERILE (TOWEL DISPOSABLE) ×1 IMPLANT
UNDERPAD 30X36 HEAVY ABSORB (UNDERPADS AND DIAPERS) ×1 IMPLANT
WATER STERILE IRR 1000ML POUR (IV SOLUTION) ×1 IMPLANT

## 2023-04-27 NOTE — Discharge Instructions (Signed)
° °  [facility] ° °Discharge Instructions ° °AV Fistula or Graft Surgery for Dialysis Access ° °Please refer to the following instructions for your post-procedure care. Your surgeon or physician assistant will discuss any changes with you. ° °Activity ° °You may drive the day following your surgery, if you are comfortable and no longer taking prescription pain medication. Resume full activity as the soreness in your incision resolves. ° °Bathing/Showering ° °You may shower after you go home. Keep your incision dry for 48 hours. Do not soak in a bathtub, hot tub, or swim until the incision heals completely. You may not shower if you have a hemodialysis catheter. ° °Incision Care ° °Clean your incision with mild soap and water after 48 hours. Pat the area dry with a clean towel. You do not need a bandage unless otherwise instructed. Do not apply any ointments or creams to your incision. You may have skin glue on your incision. Do not peel it off. It will come off on its own in about one week. Your arm may swell a bit after surgery. To reduce swelling use pillows to elevate your arm so it is above your heart. Your doctor will tell you if you need to lightly wrap your arm with an ACE bandage. ° °Diet ° °Resume your normal diet. There are not special food restrictions following this procedure. In order to heal from your surgery, it is CRITICAL to get adequate nutrition. Your body requires vitamins, minerals, and protein. Vegetables are the best source of vitamins and minerals. Vegetables also provide the perfect balance of protein. Processed food has little nutritional value, so try to avoid this. ° °Medications ° °Resume taking all of your medications. If your incision is causing pain, you may take over-the counter pain relievers such as acetaminophen (Tylenol). If you were prescribed a stronger pain medication, please be aware these medications can cause nausea and constipation. Prevent  nausea by taking the medication with a snack or meal. Avoid constipation by drinking plenty of fluids and eating foods with high amount of fiber, such as fruits, vegetables, and grains. Do not take Tylenol if you are taking prescription pain medications. ° ° ° ° °Follow up °Your surgeon may want to see you in the office following your access surgery. If so, this will be arranged at the time of your surgery. ° °Please call us immediately for any of the following conditions: ° °Increased pain, redness, drainage (pus) from your incision site °Fever of 101 degrees or higher °Severe or worsening pain at your incision site °Hand pain or numbness. ° °Reduce your risk of vascular disease: ° °Stop smoking. If you would like help, call [facility] at 1-800-QUIT-NOW (1-800-784-8669) or [place] at 336-586-4000 ° °Manage your cholesterol °Maintain a desired weight °Control your diabetes °Keep your blood pressure down ° °Dialysis ° °It will take several weeks to several months for your new dialysis access to be ready for use. Your surgeon will determine when it is OK to use it. Your nephrologist will continue to direct your dialysis. You can continue to use your Permcath until your new access is ready for use. ° °If you have any questions, please call the office at [phone]. ° °

## 2023-04-27 NOTE — Anesthesia Procedure Notes (Signed)
Anesthesia Regional Block: Interscalene brachial plexus block   Pre-Anesthetic Checklist: , timeout performed,  Correct Patient, Correct Site, Correct Laterality,  Correct Procedure, Correct Position, site marked,  Risks and benefits discussed,  Surgical consent,  Pre-op evaluation,  At surgeon's request and post-op pain management  Laterality: Right  Prep: chloraprep       Needles:  Injection technique: Single-shot  Needle Type: Echogenic Needle     Needle Length: 5cm  Needle Gauge: 21     Additional Needles:   Narrative:  Start time: 04/27/2023 7:29 AM End time: 04/27/2023 7:32 AM Injection made incrementally with aspirations every 5 mL.  Performed by: Personally  Anesthesiologist: Beryle Lathe, MD  Additional Notes: No pain on injection. No increased resistance to injection. Injection made in 5cc increments. Good needle visualization. Patient tolerated the procedure well.

## 2023-04-27 NOTE — Interval H&P Note (Signed)
History and Physical Interval Note:  04/27/2023 7:26 AM  Susan [name]  has presented today for surgery, with the diagnosis of End Stage Renal Disease.  The various methods of treatment have been discussed with the patient and family. After consideration of risks, benefits and other options for treatment, the patient has consented to  Procedure(s): RIGHT ARM ARTERIOVENOUS (AV) FISTULA CREATION (Right) as a surgical intervention.  The patient's history has been reviewed, patient examined, no change in status, stable for surgery.  I have reviewed the patient's chart and labs.  Questions were answered to the patient's satisfaction.     Leonie Douglas

## 2023-04-27 NOTE — Anesthesia Postprocedure Evaluation (Signed)
Anesthesia Post Note  Patient: Susan [name]  Procedure(s) Performed: RIGHT ARM BRACHIOBASILIC ARTERIOVENOUS (AV) FISTULA CREATION (Right: Arm Upper)     Patient location during evaluation: PACU Anesthesia Type: Regional Level of consciousness: awake and alert Pain management: pain level controlled Vital Signs Assessment: post-procedure vital signs reviewed and stable Respiratory status: spontaneous breathing, nonlabored ventilation and respiratory function stable Cardiovascular status: stable and blood pressure returned to baseline Anesthetic complications: no   No notable events documented.  Last Vitals:  Vitals:   04/27/23 0920 04/27/23 0930  BP: (!) 111/50 (!) 115/51  Pulse: 74 76  Resp: 12 11  Temp:  36.4 C  SpO2: 92% 94%    Last Pain:  Vitals:   04/27/23 0930  PainSc: 0-No pain                 Beryle Lathe

## 2023-04-27 NOTE — Transfer of Care (Signed)
Immediate Anesthesia Transfer of Care Note  Patient: Susan [name]  Procedure(s) Performed: RIGHT ARM BRACHIOBASILIC ARTERIOVENOUS (AV) FISTULA CREATION (Right: Arm Upper)  Patient Location: PACU  Anesthesia Type:General  Level of Consciousness: awake, alert , and oriented  Airway & Oxygen Therapy: Patient Spontanous Breathing and Patient connected to face mask oxygen  Post-op Assessment: Report given to RN and Post -op Vital signs reviewed and stable  Post vital signs: Reviewed and stable  Last Vitals:  Vitals Value Taken Time  BP 118/59 04/27/23 0845  Temp    Pulse 67 04/27/23 0848  Resp 20 04/27/23 0848  SpO2 100 % 04/27/23 0848  Vitals shown include unfiled device data.  Last Pain:  Vitals:   04/27/23 0623  PainSc: 0-No pain         Complications: No notable events documented.

## 2023-04-27 NOTE — Op Note (Signed)
DATE OF SERVICE: 04/27/2023  PATIENT:  Susan [name]  72 y.o. female  PRE-OPERATIVE DIAGNOSIS:  ESRD  POST-OPERATIVE DIAGNOSIS:  Same  PROCEDURE:   Right first-stage brachiobasilic arteriovenous fistula creation  SURGEON:  Surgeons and Role:    * Leonie Douglas, MD - Primary  ASSISTANT: Lianne Cure, PA-C  An experienced assistant was required given the complexity of this procedure and the standard of surgical care. My assistant helped with exposure through counter tension, suctioning, ligation and retraction to better visualize the surgical field.  My assistant expedited sewing during the case by following my sutures. Wherever I use the term "we" in the report, my assistant actively helped me with that portion of the procedure.  ANESTHESIA:   regional and MAC  EBL: minimal  BLOOD ADMINISTERED:none  DRAINS: none   LOCAL MEDICATIONS USED:  NONE  SPECIMEN:  none  COUNTS: confirmed correct.  TOURNIQUET:  none  PATIENT DISPOSITION:  PACU - hemodynamically stable.   Delay start of Pharmacological VTE agent (>24hrs) due to surgical blood loss or risk of bleeding: no  INDICATION FOR PROCEDURE: Susan [name] is a 72 y.o. female with ESRD in need of permanent dialysis access. After careful discussion of risks, benefits, and alternatives the patient was offered right brachiobasilic arteriovenous fistula creation. The patient understood and wished to proceed.  OPERATIVE FINDINGS: healthy basilic vein. Healthy brachial artery. Anastomosis created at the brachial bifurcation in the antecubital fossa. Good doppler bruit at completion. Good doppler flow in radial artery at completion.  DESCRIPTION OF PROCEDURE: After identification of the patient in the pre-operative holding area, the patient was transferred to the operating room. The patient was positioned supine on the operating room table. Anesthesia was induced. The right arm was prepped and draped in standard fashion. A surgical  pause was performed confirming correct patient, procedure, and operative location.  Using intraoperative ultrasound the right brachial artery and basilic vein were mapped.  An incision was planned over the course of the two vessels to allow fistula creation.  Incision was created.  Incision was carried down through subcutaneous tissue.  The aponeurosis of the biceps tendon was divided.  The brachial sheath was identified.  The brachial artery was skeletonized.  The artery was encircled with 2 Silastic Vesseloops.  Next attention was turned to the basilic vein.  This was identified in the medial arm in its typical position.  The vein was mobilized throughout the length of the incision to allow tension-free arteriovenous fistula creation.  The distal end of the vein was clamped with a right angle.  The proximal end of the vein was clamped with a bulldog.  The vein was transected distally.  The stump was oversewn with a 2-0 silk.  The cut end of the vein was spatulated and distended with a mosquito clamp.  Patient was systemically heparinized with 3000 units of IV heparin.  After a three minute pause, the brachial artery was clamped proximally distally.  The basilic vein was anastomosed to the brachial artery into side using continuous running suture of 6-0 Prolene.  Immediately prior to completion the anastomosis was flushed and de-aired.  The anastomosis was completed.  Clamps were released.  Hemostasis was achieved.  An audible doppler bruit was heard in the fistula.  Palpable pulse radial pulse and good doppler flow was noted in the right wrist.  Hemostasis was achieved in the surgical bed.  The wound was closed with 3-0 Vicryl and 4-0 Monocryl.  Upon completion of the case  instrument and sharps counts were confirmed correct. The patient was transferred to the PACU in good condition. I was present for all portions of the procedure.  FOLLOW UP PLAN: Assuming a normal postoperative course, VVS PA will see the  patient in 6 weeks with AVF duplex.   Rande Brunt. Lenell Antu, MD [facility] Phone Number: [phone] 04/27/2023 8:36 AM

## 2023-04-28 ENCOUNTER — Encounter (HOSPITAL_COMMUNITY): Payer: Self-pay | Admitting: Vascular Surgery

## 2023-04-28 DIAGNOSIS — D509 Iron deficiency anemia, unspecified: Secondary | ICD-10-CM | POA: Diagnosis not present

## 2023-04-28 DIAGNOSIS — Z992 Dependence on renal dialysis: Secondary | ICD-10-CM | POA: Diagnosis not present

## 2023-04-28 DIAGNOSIS — D689 Coagulation defect, unspecified: Secondary | ICD-10-CM | POA: Diagnosis not present

## 2023-04-28 DIAGNOSIS — D631 Anemia in chronic kidney disease: Secondary | ICD-10-CM | POA: Diagnosis not present

## 2023-04-28 DIAGNOSIS — N2581 Secondary hyperparathyroidism of renal origin: Secondary | ICD-10-CM | POA: Diagnosis not present

## 2023-04-28 DIAGNOSIS — N186 End stage renal disease: Secondary | ICD-10-CM | POA: Diagnosis not present

## 2023-04-29 ENCOUNTER — Ambulatory Visit (HOSPITAL_COMMUNITY): Payer: PPO | Attending: Cardiology

## 2023-04-29 ENCOUNTER — Encounter: Payer: Self-pay | Admitting: Physical Therapy

## 2023-04-29 ENCOUNTER — Ambulatory Visit: Payer: PPO | Admitting: Physical Therapy

## 2023-04-29 DIAGNOSIS — M6281 Muscle weakness (generalized): Secondary | ICD-10-CM

## 2023-04-29 DIAGNOSIS — N186 End stage renal disease: Secondary | ICD-10-CM | POA: Insufficient documentation

## 2023-04-29 DIAGNOSIS — Z7409 Other reduced mobility: Secondary | ICD-10-CM | POA: Diagnosis not present

## 2023-04-29 DIAGNOSIS — I480 Paroxysmal atrial fibrillation: Secondary | ICD-10-CM | POA: Insufficient documentation

## 2023-04-29 DIAGNOSIS — I2510 Atherosclerotic heart disease of native coronary artery without angina pectoris: Secondary | ICD-10-CM | POA: Insufficient documentation

## 2023-04-29 DIAGNOSIS — Z992 Dependence on renal dialysis: Secondary | ICD-10-CM | POA: Insufficient documentation

## 2023-04-29 DIAGNOSIS — E1165 Type 2 diabetes mellitus with hyperglycemia: Secondary | ICD-10-CM | POA: Diagnosis not present

## 2023-04-29 DIAGNOSIS — R2681 Unsteadiness on feet: Secondary | ICD-10-CM

## 2023-04-29 DIAGNOSIS — I5032 Chronic diastolic (congestive) heart failure: Secondary | ICD-10-CM | POA: Insufficient documentation

## 2023-04-29 DIAGNOSIS — Z794 Long term (current) use of insulin: Secondary | ICD-10-CM | POA: Diagnosis not present

## 2023-04-29 DIAGNOSIS — G546 Phantom limb syndrome with pain: Secondary | ICD-10-CM

## 2023-04-29 DIAGNOSIS — I5022 Chronic systolic (congestive) heart failure: Secondary | ICD-10-CM | POA: Diagnosis not present

## 2023-04-29 DIAGNOSIS — R2689 Other abnormalities of gait and mobility: Secondary | ICD-10-CM

## 2023-04-29 LAB — MYOCARDIAL PERFUSION IMAGING
LV dias vol: 148 mL (ref 46–106)
LV sys vol: 97 mL
Nuc Stress EF: 34 %
Peak HR: 95 {beats}/min
Rest HR: 88 {beats}/min
Rest Nuclear Isotope Dose: 10.2 mCi
SDS: 6
SRS: 12
SSS: 19
Stress Nuclear Isotope Dose: 31.5 mCi
TID: 1.01

## 2023-04-29 MED ORDER — TECHNETIUM TC 99M TETROFOSMIN IV KIT
10.2000 | PACK | Freq: Once | INTRAVENOUS | Status: AC | PRN
  Administered 2023-04-29: 10.2 via INTRAVENOUS

## 2023-04-29 MED ORDER — REGADENOSON 0.4 MG/5ML IV SOLN
0.4000 mg | Freq: Once | INTRAVENOUS | Status: AC
  Administered 2023-04-29: 0.4 mg via INTRAVENOUS

## 2023-04-29 MED ORDER — TECHNETIUM TC 99M TETROFOSMIN IV KIT
31.5000 | PACK | Freq: Once | INTRAVENOUS | Status: AC | PRN
  Administered 2023-04-29: 31.5 via INTRAVENOUS

## 2023-04-29 NOTE — Therapy (Signed)
OUTPATIENT PHYSICAL THERAPY PROSTHETIC TREATMENT   Patient Name: Susan [name] MRN: [id number] DOB:10-14-1950, 72 y.o., female Today's Date: 04/29/2023  PCP: Donita Brooks, MD  REFERRING PROVIDER: Nadara Mustard, MD  END OF SESSION:  PT End of Session - 04/29/23 1118     Visit Number 9    Number of Visits 25    Date for PT Re-Evaluation 06/18/23    Authorization Type [employer]    Progress Note Due on Visit 10    PT Start Time 1110    PT Stop Time 1148    PT Time Calculation (min) 38 min    Equipment Utilized During Treatment Gait belt    Activity Tolerance Patient tolerated treatment well;Patient limited by fatigue    Behavior During Therapy WFL for tasks assessed/performed                    Past Medical History:  Diagnosis Date   Anemia    hx   Anxiety    Arthritis    "generalized" (03/15/2014)   CAD (coronary artery disease)    MI in 2000 - MI  2007 - treated bare metal stent (no nuclear since then as 9/11)   Carotid artery disease (HCC)    Chronic diastolic heart failure (HCC)    a) ECHO (08/2013) EF 55-60% and RV function nl b) RHC (08/2013) RA 4, RV 30/5/7, PA 25/10 (16), PCWP 7, Fick CO/CI 6.3/2.7, PVR 1.5 WU, PA 61 and 66%   Daily headache    "~ every other day; since I fell in June" (03/15/2014)   Depression    Diabetic retinopathy (HCC)    Dyslipidemia    ESRD (end stage renal disease) (HCC)    Dialysis on Tues Thurs Sat   Exertional shortness of breath    GERD (gastroesophageal reflux disease)    History of blood transfusion    History of kidney stones    HTN (hypertension)    Hypothyroidism    Myocardial infarction (HCC)    Obesity    Osteoarthritis    PAF (paroxysmal atrial fibrillation) (HCC)    Peripheral neuropathy    bilateral feet/hands   PONV (postoperative nausea and vomiting)    RBBB (right bundle branch block)    Old   Stroke (HCC)    mini strokes   Type II diabetes mellitus (HCC)    Type II, [name]  libre left upper arm. patient has omnipod insulin pump with Novolin R Insulin   Past Surgical History:  Procedure Laterality Date   A/V FISTULAGRAM Left 11/07/2022   Procedure: A/V Fistulagram;  Surgeon: Leonie Douglas, MD;  [facility]: [facility];  Service: Cardiovascular;  Laterality: Left;   ABDOMINAL HYSTERECTOMY  1980's   AMPUTATION Right 02/24/2018   Procedure: RIGHT FOOT GREAT TOE AND 2ND TOE AMPUTATION;  Surgeon: Nadara Mustard, MD;  Location: [facility] OR;  Service: Orthopedics;  Laterality: Right;   AMPUTATION Right 04/30/2018   Procedure: RIGHT TRANSMETATARSAL AMPUTATION;  Surgeon: Nadara Mustard, MD;  Location: [facility] OR;  Service: Orthopedics;  Laterality: Right;   AMPUTATION Right 05/02/2022   Procedure: RIGHT BELOW KNEE AMPUTATION;  Surgeon: Nadara Mustard, MD;  Location: [facility] OR;  Service: Orthopedics;  Laterality: Right;   APPLICATION OF WOUND VAC Right 06/13/2022   Procedure: APPLICATION OF WOUND VAC;  Surgeon: Nadara Mustard, MD;  Location: [facility] OR;  Service: Orthopedics;  Laterality: Right;   APPLICATION OF WOUND VAC Left 11/14/2022  Procedure: APPLICATION OF WOUND VAC;  Surgeon: Nadara Mustard, MD;  Location: [facility] OR;  Service: Orthopedics;  Laterality: Left;   AV FISTULA PLACEMENT Left 04/02/2022   Procedure: LEFT ARM ARTERIOVENOUS (AV) FISTULA CREATION;  Surgeon: Nada Libman, MD;  Location: [facility] OR;  Service: Vascular;  Laterality: Left;  PERIPHERAL NERVE BLOCK   AV FISTULA PLACEMENT Right 04/27/2023   Procedure: RIGHT ARM BRACHIOBASILIC ARTERIOVENOUS (AV) FISTULA CREATION;  Surgeon: Leonie Douglas, MD;  Location: [facility] OR;  Service: Vascular;  Laterality: Right;   BASCILIC VEIN TRANSPOSITION Left 07/31/2022   Procedure: LEFT ARM SECOND STAGE BASILIC VEIN TRANSPOSITION;  Surgeon: Nada Libman, MD;  Location: [facility] OR;  Service: Vascular;  Laterality: Left;   BIOPSY  05/27/2020   Procedure: BIOPSY;  Surgeon: Lanelle Bal, DO;  [facility]: [facility];  Service: Endoscopy;;    CATARACT EXTRACTION, BILATERAL Bilateral ?2013   COLONOSCOPY W/ POLYPECTOMY     COLONOSCOPY WITH PROPOFOL N/A 03/13/2019   Procedure: COLONOSCOPY WITH PROPOFOL;  Surgeon: Beverley Fiedler, MD;  Location: [facility] ENDOSCOPY;  Service: Gastroenterology;  Laterality: N/A;   CORONARY ANGIOPLASTY WITH STENT PLACEMENT  1999; 2007   "1 + 1"   ERCP N/A 02/03/2022   Procedure: ENDOSCOPIC RETROGRADE CHOLANGIOPANCREATOGRAPHY (ERCP);  Surgeon: Jeani Hawking, MD;  Location: [facility] ENDOSCOPY;  Service: Gastroenterology;  Laterality: N/A;   ESOPHAGOGASTRODUODENOSCOPY N/A 02/12/2023   Procedure: ESOPHAGOGASTRODUODENOSCOPY (EGD);  Surgeon: Jeani Hawking, MD;  Location: [facility] ENDOSCOPY;  Service: Gastroenterology;  Laterality: N/A;   ESOPHAGOGASTRODUODENOSCOPY (EGD) WITH PROPOFOL N/A 03/13/2019   Procedure: ESOPHAGOGASTRODUODENOSCOPY (EGD) WITH PROPOFOL;  Surgeon: Beverley Fiedler, MD;  Location: [facility] ENDOSCOPY;  Service: Gastroenterology;  Laterality: N/A;   ESOPHAGOGASTRODUODENOSCOPY (EGD) WITH PROPOFOL N/A 05/27/2020   Procedure: ESOPHAGOGASTRODUODENOSCOPY (EGD) WITH PROPOFOL;  Surgeon: Lanelle Bal, DO;  [facility];  Service: Endoscopy;  Laterality: N/A;   ESOPHAGOGASTRODUODENOSCOPY (EGD) WITH PROPOFOL N/A 09/03/2022   Procedure: ESOPHAGOGASTRODUODENOSCOPY (EGD) WITH PROPOFOL;  Surgeon: Jeani Hawking, MD;  Location: [facility] ENDOSCOPY;  Service: Gastroenterology;  Laterality: N/A;   EYE SURGERY Bilateral    lazer   FLEXIBLE SIGMOIDOSCOPY N/A 05/23/2022   Procedure: FLEXIBLE SIGMOIDOSCOPY;  Surgeon: Jeani Hawking, MD;  Location: [facility] ENDOSCOPY;  Service: Gastroenterology;  Laterality: N/A;   FLEXIBLE SIGMOIDOSCOPY N/A 05/24/2022   Procedure: FLEXIBLE SIGMOIDOSCOPY;  Surgeon: Imogene Burn, MD;  Location: [facility] ENDOSCOPY;  Service: Gastroenterology;  Laterality: N/A;   FLEXIBLE SIGMOIDOSCOPY N/A 09/03/2022   Procedure: FLEXIBLE SIGMOIDOSCOPY;  Surgeon: Jeani Hawking, MD;  Location: [facility] ENDOSCOPY;  Service: Gastroenterology;   Laterality: N/A;   HEMOSTASIS CLIP PLACEMENT  03/13/2019   Procedure: HEMOSTASIS CLIP PLACEMENT;  Surgeon: Beverley Fiedler, MD;  Location: [facility] ENDOSCOPY;  Service: Gastroenterology;;   HEMOSTASIS CLIP PLACEMENT  05/23/2022   Procedure: HEMOSTASIS CLIP PLACEMENT;  Surgeon: Jeani Hawking, MD;  Location: [facility] ENDOSCOPY;  Service: Gastroenterology;;   HEMOSTASIS CONTROL  05/24/2022   Procedure: HEMOSTASIS CONTROL;  Surgeon: Imogene Burn, MD;  Location: [facility] ENDOSCOPY;  Service: Gastroenterology;;   HOT HEMOSTASIS N/A 05/23/2022   Procedure: HOT HEMOSTASIS (ARGON PLASMA COAGULATION/BICAP);  Surgeon: Jeani Hawking, MD;  Location: [facility] ENDOSCOPY;  Service: Gastroenterology;  Laterality: N/A;   I & D EXTREMITY Left 05/05/2022   Procedure: IRRIGATION AND DEBRIDEMENT LEFT ARM AV FISTULA;  Surgeon: Cephus Shelling, MD;  Location: [facility] OR;  Service: Vascular;  Laterality: Left;   I & D EXTREMITY N/A 11/14/2022   Procedure: IRRIGATION AND DEBRIDEMENT OF LOWER EXTREMITY WOUND;  Surgeon: Nadara Mustard,  MD;  Location: [facility] OR;  Service: Orthopedics;  Laterality: N/A;   INSERTION OF DIALYSIS CATHETER Right 04/02/2022   Procedure: INSERTION OF TUNNELED DIALYSIS CATHETER;  Surgeon: Nada Libman, MD;  Location: [facility] OR;  Service: Vascular;  Laterality: Right;   KNEE ARTHROSCOPY Left 10/25/2006   POLYPECTOMY  03/13/2019   Procedure: POLYPECTOMY;  Surgeon: Beverley Fiedler, MD;  Location: [facility] ENDOSCOPY;  Service: Gastroenterology;;   REMOVAL OF STONES  02/03/2022   Procedure: REMOVAL OF STONES;  Surgeon: Jeani Hawking, MD;  Location: [facility] ENDOSCOPY;  Service: Gastroenterology;;   REVISON OF ARTERIOVENOUS FISTULA Left 08/20/2022   Procedure: REVISON OF LEFT ARM ARTERIOVENOUS FISTULA;  Surgeon: Nada Libman, MD;  Location: [facility] OR;  Service: Vascular;  Laterality: Left;   RIGHT HEART CATH N/A 07/24/2017   Procedure: RIGHT HEART CATH;  Surgeon: Dolores Patty, MD;  [facility];  Service: Cardiovascular;   Laterality: N/A;   RIGHT HEART CATHETERIZATION N/A 09/22/2013   Procedure: RIGHT HEART CATH;  Surgeon: Dolores Patty, MD;  Location: [facility] CATH LAB;  Service: Cardiovascular;  Laterality: N/A;   SHOULDER ARTHROSCOPY WITH OPEN ROTATOR CUFF REPAIR Right 03/14/2014   Procedure: RIGHT SHOULDER ARTHROSCOPY WITH BICEPS RELEASE, OPEN SUBSCAPULA REPAIR, OPEN SUPRASPINATUS REPAIR.;  Surgeon: Cammy Copa, MD;  Location: [facility] OR;  Service: Orthopedics;  Laterality: Right;   SPHINCTEROTOMY  02/03/2022   Procedure: SPHINCTEROTOMY;  Surgeon: Jeani Hawking, MD;  Location: [facility] ENDOSCOPY;  Service: Gastroenterology;;   STUMP REVISION Right 06/13/2022   Procedure: REVISION RIGHT BELOW KNEE AMPUTATION;  Surgeon: Nadara Mustard, MD;  Location: [facility] OR;  Service: Orthopedics;  Laterality: Right;   TEE WITHOUT CARDIOVERSION N/A 02/04/2022   Procedure: TRANSESOPHAGEAL ECHOCARDIOGRAM (TEE);  Surgeon: Dolores Patty, MD;  Location: [facility] ENDOSCOPY;  Service: Cardiovascular;  Laterality: N/A;   THROMBECTOMY W/ EMBOLECTOMY Left 08/20/2022   Procedure: THROMBECTOMY OF LEFT ARM ARTERIOVENOUS FISTULA;  Surgeon: Nada Libman, MD;  Location: [facility] OR;  Service: Vascular;  Laterality: Left;   TOE AMPUTATION Right 02/24/2018   GREAT TOE AND 2ND TOE AMPUTATION   TUBAL LIGATION  1970's   Patient Active Problem List   Diagnosis Date Noted   Hyperosmolar hyperglycemic state (HHS) (HCC) 02/05/2023   Depression 01/29/2023   Nausea 01/24/2023   Intractable nausea and vomiting 01/20/2023   Proctitis 01/15/2023   Acute on chronic anemia 11/25/2022   Right below-knee amputee (HCC) 11/20/2022   Cellulitis of left lower extremity 11/14/2022   Maggot infestation 11/12/2022   Complicated wound infection 11/10/2022   Secondary hypercoagulable state (HCC) 09/04/2022   Right sided abdominal pain 08/31/2022   Constipation 06/07/2022   History of Clostridioides difficile colitis 06/06/2022   Below-knee amputation of right lower  extremity (HCC) 06/06/2022   Diverticulitis 06/05/2022   Stercoral colitis 06/05/2022   C. 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) 04/07/2022   Allergy, unspecified, initial encounter 04/07/2022   Dependence on renal dialysis (HCC) 04/07/2022   Gout  due to renal impairment, unspecified site 04/07/2022   Hypertensive heart and chronic kidney disease with heart failure and with stage 5 chronic kidney disease, or end stage renal disease (HCC) 04/07/2022   Personal history of transient ischemic attack (TIA), and cerebral infarction without residual deficits 04/07/2022   Renal osteodystrophy 04/07/2022   Venous stasis ulcer of right calf (HCC) 03/31/2022   Fistula, colovaginal 03/26/2022   Diarrhea 03/26/2022   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   Sepsis without acute organ dysfunction (HCC)    Bacteremia    Acute pancreatitis 02/01/2022   Abdominal pain 02/01/2022   SIRS (systemic inflammatory response syndrome) (HCC) 02/01/2022   Transaminitis 02/01/2022   History of anemia due to chronic kidney disease 02/01/2022   Paroxysmal atrial fibrillation (HCC) 02/01/2022   Uncontrolled type 2 diabetes mellitus with hyperglycemia, with long-term current use of insulin (HCC) 01/14/2022   NSTEMI (non-ST elevated myocardial infarction) (HCC) 03/05/2021   Acute renal failure superimposed on stage 4 chronic kidney disease (HCC) 08/22/2020    Hypoalbuminemia 05/25/2020   GERD (gastroesophageal reflux disease) 05/25/2020   Pressure injury of skin 05/17/2020   Acute on chronic combined systolic and diastolic congestive heart failure (HCC) 03/07/2020   Type 2 diabetes mellitus with diabetic polyneuropathy, with long-term current use of insulin (HCC) 03/07/2020   Obesity, Class III, BMI 40-49.9 (morbid obesity) (HCC) 03/07/2020   Common bile duct (CBD) obstruction 05/28/2019   Benign neoplasm of ascending colon    Benign neoplasm of transverse colon    Benign neoplasm of descending colon    Benign neoplasm of sigmoid colon    Gastric polyps    Hyperkalemia 03/11/2019   Prolonged QT interval 03/11/2019   Acute blood loss anemia 03/11/2019   Onychomycosis 06/21/2018   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)    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 (HCC)    PVD (peripheral vascular disease) (HCC) 10/26/2017   E-coli UTI 07/27/2017   Hypothyroidism 07/27/2017   AKI (acute kidney injury) (HCC)    PAH (pulmonary artery hypertension) (HCC)    Impaired ambulation 07/19/2017   Nausea & vomiting 07/15/2017   Leg cramps 02/27/2017   Peripheral edema 01/12/2017   Diabetic neuropathy (HCC) 11/12/2016   CKD (chronic kidney disease), stage IV (HCC) 10/24/2015   Anemia of chronic disease 10/03/2015   Historical diagnosis of generalized anxiety disorder 10/03/2015   Secondary insomnia 10/03/2015   Hyperglycemia due to diabetes mellitus (HCC) 06/07/2015   Non compliance with medical treatment 04/17/2014   Rotator cuff tear 03/14/2014   Obesity 09/23/2013   Chronic HFrEF (heart failure with reduced ejection fraction) (HCC) 06/03/2013   Hypotension 12/25/2012   Hyponatremia 12/25/2012   Urinary incontinence    MDD (major depressive disorder), recurrent episode, moderate (HCC) 11/12/2010   RBBB (right bundle branch block)    Wide-complex tachycardia    Coronary artery disease    Hyperlipemia 01/22/2009    Chronic hypotension 01/22/2009    ONSET DATE: 03/09/2023 MD referral to PT  REFERRING DIAG: S88.111D (ICD-10-CM) - Below-knee amputation of right lower extremity, subsequent encounter   THERAPY DIAG:  Unsteadiness on feet  Muscle weakness (generalized)  Phantom limb syndrome with pain (HCC)  Other abnormalities of gait and mobility  Impaired functional mobility, balance, gait, and endurance  Rationale for Evaluation and Treatment: Rehabilitation  SUBJECTIVE:   SUBJECTIVE STATEMENT: Patient had vascular surgery first of 2 for fistula and right upper arm on 04/27/2023.  Patient has been advised by physician that she  should not lift greater than 8 to 10 pounds for 4 to 5 weeks.  Patient has right upper extremity in a sling at this time.  PERTINENT HISTORY: Right TTA 05/02/22, Lymphedema, idiopathic chronic venous HTN with ulcer, depression, colitis, diverticulitis, ESRD, gout, NSTEMI, DM2, polyneuropathy, PVD, CAD, right bundle branch block, mini strokes  PAIN:  Are you having pain? Phantom pain with no known aggravation.   PRECAUTIONS: Fall and Other: No BP LUE  WEIGHT BEARING RESTRICTIONS: No  FALLS: Has patient fallen in last 6 months? No  LIVING ENVIRONMENT: Lives with: lives with their spouse and 2 small dogs Lives in: mobile home Home Access: Ramped entrance Home layout: One level Stairs: Yes: External: 6-7 steps; can reach both Has following equipment at home: Single point cane, Environmental consultant - 2 wheeled, Environmental consultant - 4 wheeled, Wheelchair (manual), [employer], Grab bars, and Ramped entry  OCCUPATION:  retired  PLOF: prior to amputation for ~year used RW in home & community  PATIENT GOALS:  to use prosthesis to walk, get out w/c in & out of car, get to bathroom.   OBJECTIVE:  COGNITION: Overall cognitive status:  Eval on 03/23/2023:   Within functional limits for tasks assessed  POSTURE:  Eval on 03/23/2023:  rounded shoulders, forward head, increased thoracic kyphosis,  flexed trunk , and weight shift left  LOWER EXTREMITY ROM: ROM Right eval Left eval  Hip flexion    Hip extension    Hip abduction    Hip adduction    Hip internal rotation    Hip external rotation    Knee flexion    Knee extension    Ankle dorsiflexion    Ankle plantarflexion    Ankle inversion    Ankle eversion     (Blank rows = not tested)  LOWER EXTREMITY MMT:  MMT Right eval Left eval  Hip flexion 3-/5 3-/5  Hip extension 2/5 2/5  Hip abduction 2+/5 2+/5  Hip adduction    Hip internal rotation    Hip external rotation    Knee flexion 3-/5 3-/5  Knee extension 3-/5 3-/5  Ankle dorsiflexion    Ankle plantarflexion    Ankle inversion    Ankle eversion    At Evaluation all strength testing is grossly seated and functionally standing / gait. (Blank rows = not tested)  TRANSFERS: 04/21/2023: Pt sit to stand w/c to //bar with one hand pushing on w/c and other hand on //bar with modA first time & MinA 2nd/ 3rd time.  Best is RUE on //bar & LUE on w/c.  Stand to sit reaching LUE to w/c with minA to control descent.   PT demo & verbal cues on sit to/from stand w/c to RW.  1st time modA 2 person. 2nd time maxA one person.  Stand to sit with minA and constant cueing. Scooting transfers with minA with w/c & mat direct contact & same ht.   Eval on 03/23/2023:  Sit to stand: Max A (two-person assist) from 22" w/c to rolling walker Stand to sit: Max A (two-person assist) rolling walker to wheelchair  FUNCTIONAL TESTs:   04/21/2023: Pt able to stand 60 sec with BUE on //bars with supervision.  Pt able to stand for 1 min with RW support with minA.  Eval on 03/23/2023: Patient maintained upright holding rolling walker with mod assist 2 people for safety for 30 seconds.  GAIT: 04/21/2023: Pt amb 5' in //bars with modA (2 person for safety) with PT verbal & tactile cues on  sequence and weight shift.  Pt amb 10' with RW with maxA (2 person for safety) with PT verbal &  tactile cues on sequence and weight shift.  Eval on 03/23/2023:  Patient took 2 steps (one per LE) with +2 max assist with rolling walker and TTA prosthesis.  Patient adducting prosthesis stepping on left foot with no awareness.  CURRENT PROSTHETIC WEAR ASSESSMENT:  Eval on 03/23/2023:   Patient is dependent with: skin check, residual limb care, prosthetic cleaning, ply sock cleaning, correct ply sock adjustment, proper wear schedule/adjustment, and proper weight-bearing schedule/adjustment Donning prosthesis: Max A Doffing prosthesis: Min A Prosthetic wear tolerance: 2-6 hours, 1x/day, 3-4 days/week Prosthetic weight bearing tolerance: 3 minutes with limb pain Edema: pitting edema Residual limb condition: No open areas, dry skin, normal color and temperature, cylindrical shape Prosthetic description: Silicone liner with pin lock suspension, total contact socket with flexible inner socket, sach foot    TODAY'S TREATMENT:                                                                                                                             DATE:  04/21/2023: Therapeutic Activities: PT demo and verbal cues on wheelchair set up for squat pivot transfer between 2 chairs.  Patient performed squat pivot transfer wheelchair to Nustep seat going to her left with maxA using LUE (no use of RUE) and legs with constant PT cues.  Patient performed squat pivot Nustep to wheelchair going to her right with modA using LUE (no use of RUE) and legs with constant PT cues.  PT, patient and husband discussed setting up transfers going to her right for now while her right upper extremity is not able to be used.  PT patient has been also discussed transfers to bedside commode's and toilets including managing her pants.  Pt & husband verbalized understanding.  PT left message for triage nurse at cardiovascular physicians office regarding limitations with right upper extremity.  The nurse message PT back the  patient is did not use right upper extremity with more than 8 to 10 pounds for the next 5 weeks.  Therapeutic Exercise: Nustep seat 9 level 5 with LUE & BLEs for 4 min 2 sets with 2 min rest  TREATMENT:  DATE:  04/21/2023: Therapeutic Activities: Pt sit to stand w/c to //bar with one hand pushing on w/c and other hand on //bar with modA first time & MinA 2nd/ 3rd time.  Best is RUE on //bar & LUE on w/c.  Stand to sit reaching LUE to w/c with minA to control descent.   Pt able to stand 60 sec with BUE on //bars with supervision.  PT demo & verbal cues on sit to/from stand w/c to RW.  1st time modA 2 person. 2nd time maxA one person.  Stand to sit with minA and constant cueing. Pt able to stand for 1 min with RW support with minA.     Prosthetic Training with TTA prosthesis: Pt amb 5' in //bars with modA (2 person for safety) with PT verbal & tactile cues on sequence and weight shift.  Pt amb 10' with RW with maxA (2 person for safety) with PT verbal & tactile cues on sequence and weight shift.  PT recommended wearing prosthesis once home from dialysis until in bed. Pt & husband verbalized understanding.  PT demo & verbal cues on cutoff socks to snug fit distally. PT recommended appt with prosthetist to add pads to socket.  PT verbal cues on ply socks will change with pads.  Pt & husband verbalized understanding.  PT weighed prosthesis for dialysis. Prosthesis and liner weigh 2.5kg.  PT verbal cues on need to subtract from weigh-in and weigh-out at dialysis.  She may want to remove prosthesis only (not liner & socks) once on dialysis recliner to get comfortable.  She should redon or have staff member don prior to transfer off the recliner.  Transfer set up with w/c & recliner touching so she can do scoot transfer. Pt & husband verbalized understanding.       04/15/2023: Therapeutic Activities: Pt able to scoot forward to edge of w/c and back into w/c with verbal cues only today.  Scooting transfer w/c <> level mat table with minA with cues on technique. Sit <> supine via right sidelying with minA/CGA trunk control & minA to move BLEs onto bed Supine to right sidelying with simulated bedrail with minA. Right sidelying to sit on edge of bed with mod-minA.   Therapeutic  Exercise: Bridge 5 reps 2 sets Lower trunk rotation 5 reps 2 sets Heel slide with 18" ball & strap with active knee flexion and knee ext / leg press motion 5 reps 2 sets  Supine hip abduction with PT tactile cues to minimize ER 5 reps 2 sets Trunk motions for core stability without UE support but close supervision sitting at edge of mat with bil. Feet supported on floor: 5 reps ea trunk rotation right/left, controlled back lean with sit up with target 5" behind, forward controlled lean with sit up with target 5" anterior and lateral lean down on elbow with contralateral UE HHA.   PT issued HO with verbal and tactile cues for above as HEP on non-dialysis days. Pt & husband verbalized understanding.     04/13/2023: Therapeutic Activities: Scooting transfer w/c <> level mat table with modA with cues on technique. Pt appeared to participate more in transfer today.  Sit <> supine via right sidelying with minA trunk control & modA to move BLEs onto bed Supine to right sidelying with simulated bedrail with minA. Right sidelying to sit on edge of bed with mod-maxA.  PT cueing on bed mobility with recommendation to work on getting in & out of bed on each side  so one time pillow is on left side and next time pillow is on right side. Progress head of bed from ~45* to flat over time. Pt & husband verbalized understanding.   Therapeutic  Exercise: Bridge 5 reps 2 sets Lower trunk rotation 5 reps 2 sets Heel slide with 18" ball & strap with active knee flexion and knee ext / leg press  motion 5 reps 2 sets  Supine hip abduction with PT tactile cues to minimize ER 5 reps 2 sets Trunk motions for core stability without UE support but close supervision sitting at edge of mat with bil. Feet supported on floor: 5 reps ea trunk rotation right/left, controlled back lean with sit up with target 5" behind, forward controlled lean with sit up with target 5" anterior and lateral lean down on elbow with contralateral UE HHA.   PT adding to HEP see [employer] below with HO, verbal & tactile cues.  Pt needs another session before trying at home.      PATIENT EDUCATION: PATIENT EDUCATED ON FOLLOWING PROSTHETIC CARE: Education details: Use of stockinette under proximal liner to decrease itching sensation, no wounds presents to PT recommended not using Vive wear under liner Skin check, Prosthetic cleaning, Propper donning, and Proper wear schedule/adjustment Prosthetic wear tolerance: 3 hours 2x/day, 4 days/week Person educated: Patient and Spouse Education method: Explanation, Tactile cues, and Verbal cues Education comprehension: verbalized understanding, verbal cues required, tactile cues required, and needs further education  HOME EXERCISE PROGRAM: Access Code: [email or web address] URL: [email or web address] Date: 04/15/2023 Prepared by: Vladimir Faster  Exercises - Supine Bridge  - 1 x daily - 4 x weekly - 2 sets - 5 reps - 2 seconds hold - Supine Lower Trunk Rotation  - 1 x daily - 4 x weekly - 2 sets - 5 reps - 5 seconds hold - Supine Heel Slide with Strap  - 1 x daily - 4 x weekly - 2 sets - 5 reps - 2-3 seconds hold - Supine Hip Abduction  - 1 x daily - 4 x weekly - 2 sets - 5 reps - 2-3 seconds hold - Seated Hip Flexion Toward Target  - 1 x daily - 4 x weekly - 2 sets - 5 reps - 5 seconds hold - Seated Eccentric Abdominal Lean Back  - 1 x daily - 4 x weekly - 2 sets - 5 reps - 5 seconds hold - Seated Sidebending  - 1 x daily - 4 x weekly - 2 sets - 5 reps - 5 seconds hold -  Seated Trunk Rotation with Crossed Arms  - 1 x daily - 4 x weekly - 2 sets - 5 reps - 5 seconds hold   ASSESSMENT:  CLINICAL IMPRESSION: Patient has made significant progress improving her mobility thus far with PT instructions.  Surgery on right upper extremity limiting her ability to use it per MD orders will limit standing and gait at this time.  Patient would benefit from continuing PT to work on function and conditioning within limits of her right upper extremity.   OBJECTIVE IMPAIRMENTS: Abnormal gait, decreased activity tolerance, decreased balance, decreased endurance, decreased knowledge of condition, decreased knowledge of use of DME, decreased mobility, difficulty walking, decreased ROM, decreased strength, decreased safety awareness, postural dysfunction, prosthetic dependency , and pain.   ACTIVITY LIMITATIONS: standing, transfers, and locomotion level  PARTICIPATION LIMITATIONS: community activity, household mobility and dependency / burden of care on family  PERSONAL FACTORS: Age, Fitness, Past/current experiences, Time since onset of injury/illness/exacerbation, and  3+ comorbidities: see PMH  are also affecting patient's functional outcome.   REHAB POTENTIAL: Good  CLINICAL DECISION MAKING: Evolving/moderate complexity  EVALUATION COMPLEXITY: Moderate   GOALS: Goals reviewed with patient? Yes  SHORT TERM GOALS: Target date: 04/22/2023:  Patient donnes prosthesis correctly with husband's assist & verbalizes proper cleaning. Baseline: SEE OBJECTIVE DATA Goal status:   MET 04/21/2023 2.  Patient tolerates prosthesis wear on non-dailysis days >8 hrs total /day and on dialysis days >2 hours/day without skin issues or limb pain >5/10. Baseline: SEE OBJECTIVE DATA Goal status:  partially MET 04/21/2023  3.  Patient able to stand with rolling walker with 1 person modA for 1 min.  Baseline: SEE OBJECTIVE DATA Goal status: MET 04/21/2023  4. Patient ambulates 5' with RW &  prosthesis with +2 maxA.  Baseline: SEE OBJECTIVE DATA Goal status: MET 04/21/2023  5. Patient sit to/from stand transfer with +1 maxA and scooting transfer with modA. Baseline: SEE OBJECTIVE DATA Goal status: MET 04/21/2023  LONG TERM GOALS: Target date: 06/17/2022  Patient & husband demonstrates & verbalizes understanding of prosthetic care to enable safe utilization of prosthesis. Baseline: SEE OBJECTIVE DATA Goal status: Ongoing 04/29/2023  Patient tolerates prosthesis wear >80% of awake hours on non-dialysis days and >50% on dialysis days without skin issues and limb pain </= 4/10. Baseline: SEE OBJECTIVE DATA Goal status: Ongoing 04/29/2023  Scooting transfers with minA and sit to/from stand transfers with modA.  Baseline: SEE OBJECTIVE DATA Goal status: Ongoing 04/29/2023  Patient ambulates >10' with prosthesis & RW with modA.  Baseline: SEE OBJECTIVE DATA Goal status: Ongoing 04/29/2023  Patient stands with RW support with minA or less for 2 min.  Baseline: SEE OBJECTIVE DATA Goal status:   Ongoing 04/29/2023   PLAN:  PT FREQUENCY: 2x/week  PT DURATION: 12 weeks  PLANNED INTERVENTIONS: 97164- PT Re-evaluation, 97110-Therapeutic exercises, 97530- Therapeutic activity, 97112- Neuromuscular re-education, [phone]- Self Care, 60454- Gait training, [phone]- Prosthetic training, Patient/Family education, Balance training, DME instructions, Therapeutic exercises, Therapeutic activity, Neuromuscular re-education, Gait training, and Self Care  PLAN FOR NEXT SESSION    continue Nustep.  Leg press.  core stabilization on bar stool & edge of bed.  Exercises with limited use of RUE.  Restriction RUE lifting no greater than 10 pounds through 1/6.   Vladimir Faster, PT, DPT 04/29/2023, 4:42 PM

## 2023-05-04 ENCOUNTER — Encounter: Payer: Self-pay | Admitting: Physical Therapy

## 2023-05-04 ENCOUNTER — Ambulatory Visit: Payer: PPO | Admitting: Physical Therapy

## 2023-05-04 DIAGNOSIS — Z7409 Other reduced mobility: Secondary | ICD-10-CM | POA: Diagnosis not present

## 2023-05-04 DIAGNOSIS — M6281 Muscle weakness (generalized): Secondary | ICD-10-CM

## 2023-05-04 DIAGNOSIS — R2681 Unsteadiness on feet: Secondary | ICD-10-CM

## 2023-05-04 DIAGNOSIS — G546 Phantom limb syndrome with pain: Secondary | ICD-10-CM | POA: Diagnosis not present

## 2023-05-04 DIAGNOSIS — R2689 Other abnormalities of gait and mobility: Secondary | ICD-10-CM

## 2023-05-04 MED ORDER — ZINC SULFATE 220 (50 ZN) MG PO TABS: 220.0000 mg | ORAL_TABLET | Freq: Every day | ORAL | 1 refills | Status: DC

## 2023-05-04 NOTE — Therapy (Signed)
OUTPATIENT PHYSICAL THERAPY PROSTHETIC TREATMENT & PROGRESS NOTE   Patient Name: Susan [name] MRN: [id number] DOB:May 04, 1951, 72 y.o., female Today's Date: 05/04/2023  PCP: Donita Brooks, MD  REFERRING PROVIDER: Nadara Mustard, MD  Progress Note Reporting Period 03/23/2023 to 05/04/2023  See note below for Objective Data and Assessment of Progress/Goals.   END OF SESSION:  PT End of Session - 05/04/23 1258     Visit Number 10    Number of Visits 25    Date for PT Re-Evaluation 06/18/23    Authorization Type [employer]    Progress Note Due on Visit 20    PT Start Time 1300    PT Stop Time 1344    PT Time Calculation (min) 44 min    Equipment Utilized During Treatment Gait belt    Activity Tolerance Patient tolerated treatment well;Patient limited by fatigue    Behavior During Therapy WFL for tasks assessed/performed                     Past Medical History:  Diagnosis Date   Anemia    hx   Anxiety    Arthritis    "generalized" (03/15/2014)   CAD (coronary artery disease)    MI in 2000 - MI  2007 - treated bare metal stent (no nuclear since then as 9/11)   Carotid artery disease (HCC)    Chronic diastolic heart failure (HCC)    a) ECHO (08/2013) EF 55-60% and RV function nl b) RHC (08/2013) RA 4, RV 30/5/7, PA 25/10 (16), PCWP 7, Fick CO/CI 6.3/2.7, PVR 1.5 WU, PA 61 and 66%   Daily headache    "~ every other day; since I fell in June" (03/15/2014)   Depression    Diabetic retinopathy (HCC)    Dyslipidemia    ESRD (end stage renal disease) (HCC)    Dialysis on Tues Thurs Sat   Exertional shortness of breath    GERD (gastroesophageal reflux disease)    History of blood transfusion    History of kidney stones    HTN (hypertension)    Hypothyroidism    Myocardial infarction (HCC)    Obesity    Osteoarthritis    PAF (paroxysmal atrial fibrillation) (HCC)    Peripheral neuropathy    bilateral feet/hands   PONV (postoperative nausea  and vomiting)    RBBB (right bundle branch block)    Old   Stroke (HCC)    mini strokes   Type II diabetes mellitus (HCC)    Type II, [name]. patient has omnipod insulin pump with Novolin R Insulin   Past Surgical History:  Procedure Laterality Date   A/V FISTULAGRAM Left 11/07/2022   Procedure: A/V Fistulagram;  Surgeon: Leonie Douglas, MD;  [facility]: [facility];  Service: Cardiovascular;  Laterality: Left;   ABDOMINAL HYSTERECTOMY  1980's   AMPUTATION Right 02/24/2018   Procedure: RIGHT FOOT GREAT TOE AND 2ND TOE AMPUTATION;  Surgeon: Nadara Mustard, MD;  Location: [facility] OR;  Service: Orthopedics;  Laterality: Right;   AMPUTATION Right 04/30/2018   Procedure: RIGHT TRANSMETATARSAL AMPUTATION;  Surgeon: Nadara Mustard, MD;  Location: [facility] OR;  Service: Orthopedics;  Laterality: Right;   AMPUTATION Right 05/02/2022   Procedure: RIGHT BELOW KNEE AMPUTATION;  Surgeon: Nadara Mustard, MD;  Location: [facility] OR;  Service: Orthopedics;  Laterality: Right;   APPLICATION OF WOUND VAC Right 06/13/2022   Procedure: APPLICATION OF WOUND VAC;  Surgeon:  Nadara Mustard, MD;  Location: [facility] OR;  Service: Orthopedics;  Laterality: Right;   APPLICATION OF WOUND VAC Left 11/14/2022   Procedure: APPLICATION OF WOUND VAC;  Surgeon: Nadara Mustard, MD;  [facility] OR;  Service: Orthopedics;  Laterality: Left;   AV FISTULA PLACEMENT Left 04/02/2022   Procedure: LEFT ARM ARTERIOVENOUS (AV) FISTULA CREATION;  Surgeon: Nada Libman, MD;  Location: [facility] OR;  Service: Vascular;  Laterality: Left;  PERIPHERAL NERVE BLOCK   AV FISTULA PLACEMENT Right 04/27/2023   Procedure: RIGHT ARM BRACHIOBASILIC ARTERIOVENOUS (AV) FISTULA CREATION;  Surgeon: Leonie Douglas, MD;  Location: [facility] OR;  Service: Vascular;  Laterality: Right;   BASCILIC VEIN TRANSPOSITION Left 07/31/2022   Procedure: LEFT ARM SECOND STAGE BASILIC VEIN TRANSPOSITION;  Surgeon: Nada Libman, MD;  [facility]: [facility] OR;  Service: Vascular;   Laterality: Left;   BIOPSY  05/27/2020   Procedure: BIOPSY;  Surgeon: Lanelle Bal, DO;  Location: [facility];  Service: Endoscopy;;   CATARACT EXTRACTION, BILATERAL Bilateral ?2013   COLONOSCOPY W/ POLYPECTOMY     COLONOSCOPY WITH PROPOFOL N/A 03/13/2019   Procedure: COLONOSCOPY WITH PROPOFOL;  Surgeon: Beverley Fiedler, MD;  Location: [facility] ENDOSCOPY;  Service: Gastroenterology;  Laterality: N/A;   CORONARY ANGIOPLASTY WITH STENT PLACEMENT  1999; 2007   "1 + 1"   ERCP N/A 02/03/2022   Procedure: ENDOSCOPIC RETROGRADE CHOLANGIOPANCREATOGRAPHY (ERCP);  Surgeon: Jeani Hawking, MD;  Location: [facility] ENDOSCOPY;  Service: Gastroenterology;  Laterality: N/A;   ESOPHAGOGASTRODUODENOSCOPY N/A 02/12/2023   Procedure: ESOPHAGOGASTRODUODENOSCOPY (EGD);  Surgeon: Jeani Hawking, MD;  Location: [facility] ENDOSCOPY;  Service: Gastroenterology;  Laterality: N/A;   ESOPHAGOGASTRODUODENOSCOPY (EGD) WITH PROPOFOL N/A 03/13/2019   Procedure: ESOPHAGOGASTRODUODENOSCOPY (EGD) WITH PROPOFOL;  Surgeon: Beverley Fiedler, MD;  Location: [facility] ENDOSCOPY;  Service: Gastroenterology;  Laterality: N/A;   ESOPHAGOGASTRODUODENOSCOPY (EGD) WITH PROPOFOL N/A 05/27/2020   Procedure: ESOPHAGOGASTRODUODENOSCOPY (EGD) WITH PROPOFOL;  Surgeon: Lanelle Bal, DO;  [facility];  Service: Endoscopy;  Laterality: N/A;   ESOPHAGOGASTRODUODENOSCOPY (EGD) WITH PROPOFOL N/A 09/03/2022   Procedure: ESOPHAGOGASTRODUODENOSCOPY (EGD) WITH PROPOFOL;  Surgeon: Jeani Hawking, MD;  Location: [facility] ENDOSCOPY;  Service: Gastroenterology;  Laterality: N/A;   EYE SURGERY Bilateral    lazer   FLEXIBLE SIGMOIDOSCOPY N/A 05/23/2022   Procedure: FLEXIBLE SIGMOIDOSCOPY;  Surgeon: Jeani Hawking, MD;  Location: [facility] ENDOSCOPY;  Service: Gastroenterology;  Laterality: N/A;   FLEXIBLE SIGMOIDOSCOPY N/A 05/24/2022   Procedure: FLEXIBLE SIGMOIDOSCOPY;  Surgeon: Imogene Burn, MD;  Location: [facility] ENDOSCOPY;  Service: Gastroenterology;  Laterality: N/A;   FLEXIBLE  SIGMOIDOSCOPY N/A 09/03/2022   Procedure: FLEXIBLE SIGMOIDOSCOPY;  Surgeon: Jeani Hawking, MD;  Location: [facility] ENDOSCOPY;  Service: Gastroenterology;  Laterality: N/A;   HEMOSTASIS CLIP PLACEMENT  03/13/2019   Procedure: HEMOSTASIS CLIP PLACEMENT;  Surgeon: Beverley Fiedler, MD;  Location: [facility] ENDOSCOPY;  Service: Gastroenterology;;   HEMOSTASIS CLIP PLACEMENT  05/23/2022   Procedure: HEMOSTASIS CLIP PLACEMENT;  Surgeon: Jeani Hawking, MD;  Location: [facility] ENDOSCOPY;  Service: Gastroenterology;;   HEMOSTASIS CONTROL  05/24/2022   Procedure: HEMOSTASIS CONTROL;  Surgeon: Imogene Burn, MD;  Location: [facility] ENDOSCOPY;  Service: Gastroenterology;;   HOT HEMOSTASIS N/A 05/23/2022   Procedure: HOT HEMOSTASIS (ARGON PLASMA COAGULATION/BICAP);  Surgeon: Jeani Hawking, MD;  Location: [facility] ENDOSCOPY;  Service: Gastroenterology;  Laterality: N/A;   I & D EXTREMITY Left 05/05/2022   Procedure: IRRIGATION AND DEBRIDEMENT LEFT ARM AV FISTULA;  Surgeon: Cephus Shelling, MD;  Location: [facility] OR;  Service: Vascular;  Laterality:  Left;   I & D EXTREMITY N/A 11/14/2022   Procedure: IRRIGATION AND DEBRIDEMENT OF LOWER EXTREMITY WOUND;  Surgeon: Nadara Mustard, MD;  [facility] OR;  Service: Orthopedics;  Laterality: N/A;   INSERTION OF DIALYSIS CATHETER Right 04/02/2022   Procedure: INSERTION OF TUNNELED DIALYSIS CATHETER;  Surgeon: Nada Libman, MD;  Location: [facility] OR;  Service: Vascular;  Laterality: Right;   KNEE ARTHROSCOPY Left 10/25/2006   POLYPECTOMY  03/13/2019   Procedure: POLYPECTOMY;  Surgeon: Beverley Fiedler, MD;  Location: [facility] ENDOSCOPY;  Service: Gastroenterology;;   REMOVAL OF STONES  02/03/2022   Procedure: REMOVAL OF STONES;  Surgeon: Jeani Hawking, MD;  Location: [facility] ENDOSCOPY;  Service: Gastroenterology;;   REVISON OF ARTERIOVENOUS FISTULA Left 08/20/2022   Procedure: REVISON OF LEFT ARM ARTERIOVENOUS FISTULA;  Surgeon: Nada Libman, MD;  Location: [facility] OR;  Service: Vascular;  Laterality: Left;   RIGHT  HEART CATH N/A 07/24/2017   Procedure: RIGHT HEART CATH;  Surgeon: Dolores Patty, MD;  [facility];  Service: Cardiovascular;  Laterality: N/A;   RIGHT HEART CATHETERIZATION N/A 09/22/2013   Procedure: RIGHT HEART CATH;  Surgeon: Dolores Patty, MD;  Location: [facility] CATH LAB;  Service: Cardiovascular;  Laterality: N/A;   SHOULDER ARTHROSCOPY WITH OPEN ROTATOR CUFF REPAIR Right 03/14/2014   Procedure: RIGHT SHOULDER ARTHROSCOPY WITH BICEPS RELEASE, OPEN SUBSCAPULA REPAIR, OPEN SUPRASPINATUS REPAIR.;  Surgeon: Cammy Copa, MD;  Location: [facility] OR;  Service: Orthopedics;  Laterality: Right;   SPHINCTEROTOMY  02/03/2022   Procedure: SPHINCTEROTOMY;  Surgeon: Jeani Hawking, MD;  Location: [facility] ENDOSCOPY;  Service: Gastroenterology;;   STUMP REVISION Right 06/13/2022   Procedure: REVISION RIGHT BELOW KNEE AMPUTATION;  Surgeon: Nadara Mustard, MD;  Location: [facility] OR;  Service: Orthopedics;  Laterality: Right;   TEE WITHOUT CARDIOVERSION N/A 02/04/2022   Procedure: TRANSESOPHAGEAL ECHOCARDIOGRAM (TEE);  Surgeon: Dolores Patty, MD;  Location: [facility] ENDOSCOPY;  Service: Cardiovascular;  Laterality: N/A;   THROMBECTOMY W/ EMBOLECTOMY Left 08/20/2022   Procedure: THROMBECTOMY OF LEFT ARM ARTERIOVENOUS FISTULA;  Surgeon: Nada Libman, MD;  Location[facility] OR;  Service: Vascular;  Laterality: Left;   TOE AMPUTATION Right 02/24/2018   GREAT TOE AND 2ND TOE AMPUTATION   TUBAL LIGATION  1970's   Patient Active Problem List   Diagnosis Date Noted   Hyperosmolar hyperglycemic state (HHS) (HCC) 02/05/2023   Depression 01/29/2023   Nausea 01/24/2023   Intractable nausea and vomiting 01/20/2023   Proctitis 01/15/2023   Acute on chronic anemia 11/25/2022   Right below-knee amputee (HCC) 11/20/2022   Cellulitis of left lower extremity 11/14/2022   Maggot infestation 11/12/2022   Complicated wound infection 11/10/2022   Secondary hypercoagulable state (HCC) 09/04/2022   Right sided  abdominal pain 08/31/2022   Constipation 06/07/2022   History of Clostridioides difficile colitis 06/06/2022   Below-knee amputation of right lower extremity (HCC) 06/06/2022   Diverticulitis 06/05/2022   Stercoral colitis 06/05/2022   C. difficile colitis 06/05/2022   Spleen hematoma 06/05/2022   Dehiscence of amputation stump of right lower extremity (HCC) 06/05/2022   Rectal ulcer 05/27/2022   ESRD (end stage renal disease) (HCC) 05/27/2022   GI bleed 05/23/2022   Difficult intravenous access 05/23/2022   Gangrene of right foot (HCC) 05/02/2022   S/P BKA (below knee amputation) unilateral, right (HCC) 05/02/2022   Unspecified protein-calorie malnutrition (HCC) 04/15/2022   Secondary hyperparathyroidism of renal origin (HCC) 04/14/2022   Coagulation defect, unspecified (HCC) 04/09/2022   Acquired absence  of other left toe(s) (HCC) 04/07/2022   Allergy, unspecified, initial encounter 04/07/2022   Dependence on renal dialysis (HCC) 04/07/2022   Gout due to renal impairment, unspecified site 04/07/2022   Hypertensive heart and chronic kidney disease with heart failure and with stage 5 chronic kidney disease, or end stage renal disease (HCC) 04/07/2022   Personal history of transient ischemic attack (TIA), and cerebral infarction without residual deficits 04/07/2022   Renal osteodystrophy 04/07/2022   Venous stasis ulcer of right calf (HCC) 03/31/2022   Fistula, colovaginal 03/26/2022   Diarrhea 03/26/2022   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   Sepsis without acute organ dysfunction (HCC)    Bacteremia    Acute pancreatitis 02/01/2022   Abdominal pain 02/01/2022   SIRS (systemic inflammatory response syndrome) (HCC) 02/01/2022   Transaminitis 02/01/2022   History of anemia due to chronic kidney disease 02/01/2022   Paroxysmal atrial fibrillation (HCC) 02/01/2022   Uncontrolled type 2 diabetes mellitus with hyperglycemia, with long-term current use of insulin (HCC) 01/14/2022    NSTEMI (non-ST elevated myocardial infarction) (HCC) 03/05/2021   Acute renal failure superimposed on stage 4 chronic kidney disease (HCC) 08/22/2020   Hypoalbuminemia 05/25/2020   GERD (gastroesophageal reflux disease) 05/25/2020   Pressure injury of skin 05/17/2020   Acute on chronic combined systolic and diastolic congestive heart failure (HCC) 03/07/2020   Type 2 diabetes mellitus with diabetic polyneuropathy, with long-term current use of insulin (HCC) 03/07/2020   Obesity, Class III, BMI 40-49.9 (morbid obesity) (HCC) 03/07/2020   Common bile duct (CBD) obstruction 05/28/2019   Benign neoplasm of ascending colon    Benign neoplasm of transverse colon    Benign neoplasm of descending colon    Benign neoplasm of sigmoid colon    Gastric polyps    Hyperkalemia 03/11/2019   Prolonged QT interval 03/11/2019   Acute blood loss anemia 03/11/2019   Onychomycosis 06/21/2018   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)    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 (HCC)    PVD (peripheral vascular disease) (HCC) 10/26/2017   E-coli UTI 07/27/2017   Hypothyroidism 07/27/2017   AKI (acute kidney injury) (HCC)    PAH (pulmonary artery hypertension) (HCC)    Impaired ambulation 07/19/2017   Nausea & vomiting 07/15/2017   Leg cramps 02/27/2017   Peripheral edema 01/12/2017   Diabetic neuropathy (HCC) 11/12/2016   CKD (chronic kidney disease), stage IV (HCC) 10/24/2015   Anemia of chronic disease 10/03/2015   Historical diagnosis of generalized anxiety disorder 10/03/2015   Secondary insomnia 10/03/2015   Hyperglycemia due to diabetes mellitus (HCC) 06/07/2015   Non compliance with medical treatment 04/17/2014   Rotator cuff tear 03/14/2014   Obesity 09/23/2013   Chronic HFrEF (heart failure with reduced ejection fraction) (HCC) 06/03/2013   Hypotension 12/25/2012   Hyponatremia 12/25/2012   Urinary incontinence    MDD (major depressive disorder), recurrent  episode, moderate (HCC) 11/12/2010   RBBB (right bundle branch block)    Wide-complex tachycardia    Coronary artery disease    Hyperlipemia 01/22/2009   Chronic hypotension 01/22/2009    ONSET DATE: 03/09/2023 MD referral to PT  REFERRING DIAG: S88.111D (ICD-10-CM) - Below-knee amputation of right lower extremity, subsequent encounter   THERAPY DIAG:  Unsteadiness on feet  Muscle weakness (generalized)  Phantom limb syndrome with pain (HCC)  Other abnormalities of gait and mobility  Impaired functional mobility, balance, gait, and endurance  Rationale for Evaluation and Treatment: Rehabilitation  SUBJECTIVE:   SUBJECTIVE STATEMENT:  Husband reports that transfers no longer need sliding board for transfers.  She has been wearing prosthesis to dialysis without issues.   PERTINENT HISTORY: Right TTA 05/02/22, Lymphedema, idiopathic chronic venous HTN with ulcer, depression, colitis, diverticulitis, ESRD, gout, NSTEMI, DM2, polyneuropathy, PVD, CAD, right bundle branch block, mini strokes  PAIN:  Are you having pain? Phantom pain with no known aggravation.   PRECAUTIONS: Fall and Other: No BP LUE  WEIGHT BEARING RESTRICTIONS: No  FALLS: Has patient fallen in last 6 months? No  LIVING ENVIRONMENT: Lives with: lives with their spouse and 2 small dogs Lives in: mobile home Home Access: Ramped entrance Home layout: One level Stairs: Yes: External: 6-7 steps; can reach both Has following equipment at home: Single point cane, Environmental consultant - 2 wheeled, Environmental consultant - 4 wheeled, Wheelchair (manual), [employer], Grab bars, and Ramped entry  OCCUPATION:  retired  PLOF: prior to amputation for ~year used RW in home & community  PATIENT GOALS:  to use prosthesis to walk, get out w/c in & out of car, get to bathroom.   OBJECTIVE:  COGNITION: Overall cognitive status:  Eval on 03/23/2023:   Within functional limits for tasks assessed  POSTURE:  Eval on 03/23/2023:  rounded shoulders,  forward head, increased thoracic kyphosis, flexed trunk , and weight shift left  LOWER EXTREMITY ROM: ROM Right eval Left eval  Hip flexion    Hip extension    Hip abduction    Hip adduction    Hip internal rotation    Hip external rotation    Knee flexion    Knee extension    Ankle dorsiflexion    Ankle plantarflexion    Ankle inversion    Ankle eversion     (Blank rows = not tested)  LOWER EXTREMITY MMT:  MMT Right eval Left eval  Hip flexion 3-/5 3-/5  Hip extension 2/5 2/5  Hip abduction 2+/5 2+/5  Hip adduction    Hip internal rotation    Hip external rotation    Knee flexion 3-/5 3-/5  Knee extension 3-/5 3-/5  Ankle dorsiflexion    Ankle plantarflexion    Ankle inversion    Ankle eversion    At Evaluation all strength testing is grossly seated and functionally standing / gait. (Blank rows = not tested)  TRANSFERS: 05/04/2023: Squat pivot transfers with modA.  04/21/2023: Pt sit to stand w/c to //bar with one hand pushing on w/c and other hand on //bar with modA first time & MinA 2nd/ 3rd time.  Best is RUE on //bar & LUE on w/c.  Stand to sit reaching LUE to w/c with minA to control descent.   PT demo & verbal cues on sit to/from stand w/c to RW.  1st time modA 2 person. 2nd time maxA one person.  Stand to sit with minA and constant cueing. Scooting transfers with minA with w/c & mat direct contact & same ht.   Eval on 03/23/2023:  Sit to stand: Max A (two-person assist) from 22" w/c to rolling walker Stand to sit: Max A (two-person assist) rolling walker to wheelchair  FUNCTIONAL TESTs:   04/21/2023: Pt able to stand 60 sec with BUE on //bars with supervision.  Pt able to stand for 1 min with RW support with minA.  Eval on 03/23/2023: Patient maintained upright holding rolling walker with mod assist 2 people for safety for 30 seconds.  GAIT: 04/21/2023: Pt amb 5' in //bars with modA (2 person for safety) with PT verbal &  tactile cues on sequence and  weight shift.  Pt amb 10' with RW with maxA (2 person for safety) with PT verbal & tactile cues on sequence and weight shift.  Eval on 03/23/2023:  Patient took 2 steps (one per LE) with +2 max assist with rolling walker and TTA prosthesis.  Patient adducting prosthesis stepping on left foot with no awareness.  CURRENT PROSTHETIC WEAR ASSESSMENT:  Eval on 03/23/2023:   Patient is dependent with: skin check, residual limb care, prosthetic cleaning, ply sock cleaning, correct ply sock adjustment, proper wear schedule/adjustment, and proper weight-bearing schedule/adjustment Donning prosthesis: Max A Doffing prosthesis: Min A Prosthetic wear tolerance: 2-6 hours, 1x/day, 3-4 days/week Prosthetic weight bearing tolerance: 3 minutes with limb pain Edema: pitting edema Residual limb condition: No open areas, dry skin, normal color and temperature, cylindrical shape Prosthetic description: Silicone liner with pin lock suspension, total contact socket with flexible inner socket, sach foot    TODAY'S TREATMENT:                                                                                                                             DATE:  05/04/2023: Therapeutic Activities: Nustep to/from w/c (same height) squat pivot transfer with modA. RUE contact assist only. W/c to leg press (2" higher) squat pivot transfer with maxA and leg press to w/c with modA.   Therapeutic Exercise: Nustep seat 9 level 5 with LUE & BLEs for 8 min Leg press BLEs 50# 10 reps 2 sets;  marching to simulate SLS in midstance with knee buckling - 25# BLEs concentric & eccentric and isometric hold while lifting contralateral off plate -  5 reps LLE lead and 5 reps RLE lead Walking w/c alternating LEs flexion 50'  and pushing w/c backwards with LE ext 50' -PT tactile & verbal cues.     TREATMENT:                                                                                                                             DATE:   04/21/2023: Therapeutic Activities: PT demo and verbal cues on wheelchair set up for squat pivot transfer between 2 chairs.  Patient performed squat pivot transfer wheelchair to Nustep seat going to her left with maxA using LUE (no use of RUE) and legs with constant PT cues.  Patient performed squat pivot Nustep to wheelchair going to her right with modA using LUE (no  use of RUE) and legs with constant PT cues.  PT, patient and husband discussed setting up transfers going to her right for now while her right upper extremity is not able to be used.  PT patient has been also discussed transfers to bedside commode's and toilets including managing her pants.  Pt & husband verbalized understanding.  PT left message for triage nurse at cardiovascular physicians office regarding limitations with right upper extremity.  The nurse message PT back the patient is did not use right upper extremity with more than 8 to 10 pounds for the next 5 weeks.  Therapeutic Exercise: Nustep seat 9 level 5 with LUE & BLEs for 4 min 2 sets with 2 min rest  TREATMENT:                                                                                                                             DATE:  04/21/2023: Therapeutic Activities: Pt sit to stand w/c to //bar with one hand pushing on w/c and other hand on //bar with modA first time & MinA 2nd/ 3rd time.  Best is RUE on //bar & LUE on w/c.  Stand to sit reaching LUE to w/c with minA to control descent.   Pt able to stand 60 sec with BUE on //bars with supervision.  PT demo & verbal cues on sit to/from stand w/c to RW.  1st time modA 2 person. 2nd time maxA one person.  Stand to sit with minA and constant cueing. Pt able to stand for 1 min with RW support with minA.     Prosthetic Training with TTA prosthesis: Pt amb 5' in //bars with modA (2 person for safety) with PT verbal & tactile cues on sequence and weight shift.  Pt amb 10' with RW with maxA (2 person for safety)  with PT verbal & tactile cues on sequence and weight shift.  PT recommended wearing prosthesis once home from dialysis until in bed. Pt & husband verbalized understanding.  PT demo & verbal cues on cutoff socks to snug fit distally. PT recommended appt with prosthetist to add pads to socket.  PT verbal cues on ply socks will change with pads.  Pt & husband verbalized understanding.  PT weighed prosthesis for dialysis. Prosthesis and liner weigh 2.5kg.  PT verbal cues on need to subtract from weigh-in and weigh-out at dialysis.  She may want to remove prosthesis only (not liner & socks) once on dialysis recliner to get comfortable.  She should redon or have staff member don prior to transfer off the recliner.  Transfer set up with w/c & recliner touching so she can do scoot transfer. Pt & husband verbalized understanding.       PATIENT EDUCATION: PATIENT EDUCATED ON FOLLOWING PROSTHETIC CARE: Education details: Use of stockinette under proximal liner to decrease itching sensation, no wounds presents to PT recommended not using Vive wear under liner Skin check, Prosthetic cleaning, Propper donning, and Proper  wear schedule/adjustment Prosthetic wear tolerance: 3 hours 2x/day, 4 days/week Person educated: Patient and Spouse Education method: Explanation, Tactile cues, and Verbal cues Education comprehension: verbalized understanding, verbal cues required, tactile cues required, and needs further education  HOME EXERCISE PROGRAM: Access Code: [email or web address] URL: [email or web address] Date: 04/15/2023 Prepared by: Vladimir Faster  Exercises - Supine Bridge  - 1 x daily - 4 x weekly - 2 sets - 5 reps - 2 seconds hold - Supine Lower Trunk Rotation  - 1 x daily - 4 x weekly - 2 sets - 5 reps - 5 seconds hold - Supine Heel Slide with Strap  - 1 x daily - 4 x weekly - 2 sets - 5 reps - 2-3 seconds hold - Supine Hip Abduction  - 1 x daily - 4 x weekly - 2 sets - 5 reps - 2-3 seconds hold -  Seated Hip Flexion Toward Target  - 1 x daily - 4 x weekly - 2 sets - 5 reps - 5 seconds hold - Seated Eccentric Abdominal Lean Back  - 1 x daily - 4 x weekly - 2 sets - 5 reps - 5 seconds hold - Seated Sidebending  - 1 x daily - 4 x weekly - 2 sets - 5 reps - 5 seconds hold - Seated Trunk Rotation with Crossed Arms  - 1 x daily - 4 x weekly - 2 sets - 5 reps - 5 seconds hold   ASSESSMENT:  CLINICAL IMPRESSION: Patient and husband are reporting that patient is participating in transfers to extent that they do not need the sliding board now.  PT continues to progress functional activities but limit RUE use per CV surgeon guidelines.  Patient continues to benefit from skilled PT. Her progress is slow due to extensive deconditioning and complex medical issues.    OBJECTIVE IMPAIRMENTS: Abnormal gait, decreased activity tolerance, decreased balance, decreased endurance, decreased knowledge of condition, decreased knowledge of use of DME, decreased mobility, difficulty walking, decreased ROM, decreased strength, decreased safety awareness, postural dysfunction, prosthetic dependency , and pain.   ACTIVITY LIMITATIONS: standing, transfers, and locomotion level  PARTICIPATION LIMITATIONS: community activity, household mobility and dependency / burden of care on family  PERSONAL FACTORS: Age, Fitness, Past/current experiences, Time since onset of injury/illness/exacerbation, and 3+ comorbidities: see PMH  are also affecting patient's functional outcome.   REHAB POTENTIAL: Good  CLINICAL DECISION MAKING: Evolving/moderate complexity  EVALUATION COMPLEXITY: Moderate   GOALS: Goals reviewed with patient? Yes  SHORT TERM GOALS: Target date: 04/22/2023:  Patient donnes prosthesis correctly with husband's assist & verbalizes proper cleaning. Baseline: SEE OBJECTIVE DATA Goal status:   MET 04/21/2023 2.  Patient tolerates prosthesis wear on non-dailysis days >8 hrs total /day and on dialysis days  >2 hours/day without skin issues or limb pain >5/10. Baseline: SEE OBJECTIVE DATA Goal status:  partially MET 04/21/2023  3.  Patient able to stand with rolling walker with 1 person modA for 1 min.  Baseline: SEE OBJECTIVE DATA Goal status: MET 04/21/2023  4. Patient ambulates 5' with RW & prosthesis with +2 maxA.  Baseline: SEE OBJECTIVE DATA Goal status: MET 04/21/2023  5. Patient sit to/from stand transfer with +1 maxA and scooting transfer with modA. Baseline: SEE OBJECTIVE DATA Goal status: MET 04/21/2023  LONG TERM GOALS: Target date: 06/17/2022  Patient & husband demonstrates & verbalizes understanding of prosthetic care to enable safe utilization of prosthesis. Baseline: SEE OBJECTIVE DATA Goal status: Ongoing 04/29/2023  Patient tolerates prosthesis wear >80% of awake hours  on non-dialysis days and >50% on dialysis days without skin issues and limb pain </= 4/10. Baseline: SEE OBJECTIVE DATA Goal status: Ongoing 04/29/2023  Scooting transfers with minA and sit to/from stand transfers with modA.  Baseline: SEE OBJECTIVE DATA Goal status: Ongoing 04/29/2023  Patient ambulates >10' with prosthesis & RW with modA.  Baseline: SEE OBJECTIVE DATA Goal status: Ongoing 04/29/2023  Patient stands with RW support with minA or less for 2 min.  Baseline: SEE OBJECTIVE DATA Goal status:   Ongoing 04/29/2023   PLAN:  PT FREQUENCY: 2x/week  PT DURATION: 12 weeks  PLANNED INTERVENTIONS: 97164- PT Re-evaluation, 97110-Therapeutic exercises, 97530- Therapeutic activity, 97112- Neuromuscular re-education, [id number]- Self Care, 60454- Gait training, [phone]- Prosthetic training, Patient/Family education, Balance training, DME instructions, Therapeutic exercises, Therapeutic activity, Neuromuscular re-education, Gait training, and Self Care  PLAN FOR NEXT SESSION    continue Nustep.  Leg press.  core stabilization on bar stool & edge of bed.  Exercises with limited use of RUE.  Restriction RUE  lifting no greater than 10 pounds through 1/6.   Vladimir Faster, PT, DPT 05/04/2023, 3:04 PM

## 2023-05-05 DIAGNOSIS — N2581 Secondary hyperparathyroidism of renal origin: Secondary | ICD-10-CM | POA: Diagnosis not present

## 2023-05-05 DIAGNOSIS — Z992 Dependence on renal dialysis: Secondary | ICD-10-CM | POA: Diagnosis not present

## 2023-05-05 DIAGNOSIS — D509 Iron deficiency anemia, unspecified: Secondary | ICD-10-CM | POA: Diagnosis not present

## 2023-05-05 DIAGNOSIS — D689 Coagulation defect, unspecified: Secondary | ICD-10-CM | POA: Diagnosis not present

## 2023-05-05 DIAGNOSIS — N186 End stage renal disease: Secondary | ICD-10-CM | POA: Diagnosis not present

## 2023-05-06 ENCOUNTER — Ambulatory Visit: Payer: PPO | Admitting: Physical Therapy

## 2023-05-06 ENCOUNTER — Encounter: Payer: Self-pay | Admitting: Physical Therapy

## 2023-05-06 DIAGNOSIS — R2689 Other abnormalities of gait and mobility: Secondary | ICD-10-CM | POA: Diagnosis not present

## 2023-05-06 DIAGNOSIS — R2681 Unsteadiness on feet: Secondary | ICD-10-CM

## 2023-05-06 DIAGNOSIS — G546 Phantom limb syndrome with pain: Secondary | ICD-10-CM | POA: Diagnosis not present

## 2023-05-06 DIAGNOSIS — Z7409 Other reduced mobility: Secondary | ICD-10-CM | POA: Diagnosis not present

## 2023-05-06 DIAGNOSIS — M6281 Muscle weakness (generalized): Secondary | ICD-10-CM | POA: Diagnosis not present

## 2023-05-06 NOTE — Therapy (Signed)
OUTPATIENT PHYSICAL THERAPY PROSTHETIC TREATMENT   Patient Name: Susan [name] MRN: [id number] DOB:Jun 24, 1950, 72 y.o., female Today's Date: 05/06/2023  PCP: Donita Brooks, MD  REFERRING PROVIDER: Nadara Mustard, MD  END OF SESSION:  PT End of Session - 05/06/23 1325     Visit Number 11    Number of Visits 25    Date for PT Re-Evaluation 06/18/23    Authorization Type [employer]    Progress Note Due on Visit 20    PT Start Time 1335    PT Stop Time 1422    PT Time Calculation (min) 47 min    Equipment Utilized During Treatment Gait belt    Activity Tolerance Patient tolerated treatment well;Patient limited by fatigue    Behavior During Therapy WFL for tasks assessed/performed                      Past Medical History:  Diagnosis Date   Anemia    hx   Anxiety    Arthritis    "generalized" (03/15/2014)   CAD (coronary artery disease)    MI in 2000 - MI  2007 - treated bare metal stent (no nuclear since then as 9/11)   Carotid artery disease (HCC)    Chronic diastolic heart failure (HCC)    a) ECHO (08/2013) EF 55-60% and RV function nl b) RHC (08/2013) RA 4, RV 30/5/7, PA 25/10 (16), PCWP 7, Fick CO/CI 6.3/2.7, PVR 1.5 WU, PA 61 and 66%   Daily headache    "~ every other day; since I fell in June" (03/15/2014)   Depression    Diabetic retinopathy (HCC)    Dyslipidemia    ESRD (end stage renal disease) (HCC)    Dialysis on Tues Thurs Sat   Exertional shortness of breath    GERD (gastroesophageal reflux disease)    History of blood transfusion    History of kidney stones    HTN (hypertension)    Hypothyroidism    Myocardial infarction (HCC)    Obesity    Osteoarthritis    PAF (paroxysmal atrial fibrillation) (HCC)    Peripheral neuropathy    bilateral feet/hands   PONV (postoperative nausea and vomiting)    RBBB (right bundle branch block)    Old   Stroke (HCC)    mini strokes   Type II diabetes mellitus (HCC)    Type II,  [name]. patient has omnipod insulin pump with Novolin R Insulin   Past Surgical History:  Procedure Laterality Date   A/V FISTULAGRAM Left 11/07/2022   Procedure: A/V Fistulagram;  Surgeon: Leonie Douglas, MD;  [facility]: [facility];  Service: Cardiovascular;  Laterality: Left;   ABDOMINAL HYSTERECTOMY  1980's   AMPUTATION Right 02/24/2018   Procedure: RIGHT FOOT GREAT TOE AND 2ND TOE AMPUTATION;  Surgeon: Nadara Mustard, MD;  Location: [facility] OR;  Service: Orthopedics;  Laterality: Right;   AMPUTATION Right 04/30/2018   Procedure: RIGHT TRANSMETATARSAL AMPUTATION;  Surgeon: Nadara Mustard, MD;  Location: [facility] OR;  Service: Orthopedics;  Laterality: Right;   AMPUTATION Right 05/02/2022   Procedure: RIGHT BELOW KNEE AMPUTATION;  Surgeon: Nadara Mustard, MD;  Location: [facility] OR;  Service: Orthopedics;  Laterality: Right;   APPLICATION OF WOUND VAC Right 06/13/2022   Procedure: APPLICATION OF WOUND VAC;  Surgeon: Nadara Mustard, MD;  Location: [facility] OR;  Service: Orthopedics;  Laterality: Right;   APPLICATION OF WOUND VAC Left 11/14/2022  Procedure: APPLICATION OF WOUND VAC;  Surgeon: Nadara Mustard, MD;  Location: [facility] OR;  Service: Orthopedics;  Laterality: Left;   AV FISTULA PLACEMENT Left 04/02/2022   Procedure: LEFT ARM ARTERIOVENOUS (AV) FISTULA CREATION;  Surgeon: Nada Libman, MD;  Location: [facility] OR;  Service: Vascular;  Laterality: Left;  PERIPHERAL NERVE BLOCK   AV FISTULA PLACEMENT Right 04/27/2023   Procedure: RIGHT ARM BRACHIOBASILIC ARTERIOVENOUS (AV) FISTULA CREATION;  Surgeon: Leonie Douglas, MD;  Location: [facility] OR;  Service: Vascular;  Laterality: Right;   BASCILIC VEIN TRANSPOSITION Left 07/31/2022   Procedure: LEFT ARM SECOND STAGE BASILIC VEIN TRANSPOSITION;  Surgeon: Nada Libman, MD;  Location: [facility] OR;  Service: Vascular;  Laterality: Left;   BIOPSY  05/27/2020   Procedure: BIOPSY;  Surgeon: Lanelle Bal, DO;  [facility]: [facility];  Service: Endoscopy;;    CATARACT EXTRACTION, BILATERAL Bilateral ?2013   COLONOSCOPY W/ POLYPECTOMY     COLONOSCOPY WITH PROPOFOL N/A 03/13/2019   Procedure: COLONOSCOPY WITH PROPOFOL;  Surgeon: Beverley Fiedler, MD;  Location: [facility] ENDOSCOPY;  Service: Gastroenterology;  Laterality: N/A;   CORONARY ANGIOPLASTY WITH STENT PLACEMENT  1999; 2007   "1 + 1"   ERCP N/A 02/03/2022   Procedure: ENDOSCOPIC RETROGRADE CHOLANGIOPANCREATOGRAPHY (ERCP);  Surgeon: Jeani Hawking, MD;  Location: [facility] ENDOSCOPY;  Service: Gastroenterology;  Laterality: N/A;   ESOPHAGOGASTRODUODENOSCOPY N/A 02/12/2023   Procedure: ESOPHAGOGASTRODUODENOSCOPY (EGD);  Surgeon: Jeani Hawking, MD;  Location: [facility] ENDOSCOPY;  Service: Gastroenterology;  Laterality: N/A;   ESOPHAGOGASTRODUODENOSCOPY (EGD) WITH PROPOFOL N/A 03/13/2019   Procedure: ESOPHAGOGASTRODUODENOSCOPY (EGD) WITH PROPOFOL;  Surgeon: Beverley Fiedler, MD;  Location: [facility] ENDOSCOPY;  Service: Gastroenterology;  Laterality: N/A;   ESOPHAGOGASTRODUODENOSCOPY (EGD) WITH PROPOFOL N/A 05/27/2020   Procedure: ESOPHAGOGASTRODUODENOSCOPY (EGD) WITH PROPOFOL;  Surgeon: Lanelle Bal, DO;  [facility];  Service: Endoscopy;  Laterality: N/A;   ESOPHAGOGASTRODUODENOSCOPY (EGD) WITH PROPOFOL N/A 09/03/2022   Procedure: ESOPHAGOGASTRODUODENOSCOPY (EGD) WITH PROPOFOL;  Surgeon: Jeani Hawking, MD;  Location: [facility] ENDOSCOPY;  Service: Gastroenterology;  Laterality: N/A;   EYE SURGERY Bilateral    lazer   FLEXIBLE SIGMOIDOSCOPY N/A 05/23/2022   Procedure: FLEXIBLE SIGMOIDOSCOPY;  Surgeon: Jeani Hawking, MD;  Location: [facility] ENDOSCOPY;  Service: Gastroenterology;  Laterality: N/A;   FLEXIBLE SIGMOIDOSCOPY N/A 05/24/2022   Procedure: FLEXIBLE SIGMOIDOSCOPY;  Surgeon: Imogene Burn, MD;  Location: [facility] ENDOSCOPY;  Service: Gastroenterology;  Laterality: N/A;   FLEXIBLE SIGMOIDOSCOPY N/A 09/03/2022   Procedure: FLEXIBLE SIGMOIDOSCOPY;  Surgeon: Jeani Hawking, MD;  Location: [facility] ENDOSCOPY;  Service: Gastroenterology;   Laterality: N/A;   HEMOSTASIS CLIP PLACEMENT  03/13/2019   Procedure: HEMOSTASIS CLIP PLACEMENT;  Surgeon: Beverley Fiedler, MD;  Location: [facility] ENDOSCOPY;  Service: Gastroenterology;;   HEMOSTASIS CLIP PLACEMENT  05/23/2022   Procedure: HEMOSTASIS CLIP PLACEMENT;  Surgeon: Jeani Hawking, MD;  Location: [facility] ENDOSCOPY;  Service: Gastroenterology;;   HEMOSTASIS CONTROL  05/24/2022   Procedure: HEMOSTASIS CONTROL;  Surgeon: Imogene Burn, MD;  Location: [facility] ENDOSCOPY;  Service: Gastroenterology;;   HOT HEMOSTASIS N/A 05/23/2022   Procedure: HOT HEMOSTASIS (ARGON PLASMA COAGULATION/BICAP);  Surgeon: Jeani Hawking, MD;  Location: [facility] ENDOSCOPY;  Service: Gastroenterology;  Laterality: N/A;   I & D EXTREMITY Left 05/05/2022   Procedure: IRRIGATION AND DEBRIDEMENT LEFT ARM AV FISTULA;  Surgeon: Cephus Shelling, MD;  Location: [facility] OR;  Service: Vascular;  Laterality: Left;   I & D EXTREMITY N/A 11/14/2022   Procedure: IRRIGATION AND DEBRIDEMENT OF LOWER EXTREMITY WOUND;  Surgeon: Nadara Mustard,  MD;  Location: [facility] OR;  Service: Orthopedics;  Laterality: N/A;   INSERTION OF DIALYSIS CATHETER Right 04/02/2022   Procedure: INSERTION OF TUNNELED DIALYSIS CATHETER;  Surgeon: Nada Libman, MD;  Location: [facility] OR;  Service: Vascular;  Laterality: Right;   KNEE ARTHROSCOPY Left 10/25/2006   POLYPECTOMY  03/13/2019   Procedure: POLYPECTOMY;  Surgeon: Beverley Fiedler, MD;  Location: [facility] ENDOSCOPY;  Service: Gastroenterology;;   REMOVAL OF STONES  02/03/2022   Procedure: REMOVAL OF STONES;  Surgeon: Jeani Hawking, MD;  Location: [facility] ENDOSCOPY;  Service: Gastroenterology;;   REVISON OF ARTERIOVENOUS FISTULA Left 08/20/2022   Procedure: REVISON OF LEFT ARM ARTERIOVENOUS FISTULA;  Surgeon: Nada Libman, MD;  Location: [facility] OR;  Service: Vascular;  Laterality: Left;   RIGHT HEART CATH N/A 07/24/2017   Procedure: RIGHT HEART CATH;  Surgeon: Dolores Patty, MD;  [facility];  Service: Cardiovascular;   Laterality: N/A;   RIGHT HEART CATHETERIZATION N/A 09/22/2013   Procedure: RIGHT HEART CATH;  Surgeon: Dolores Patty, MD;  Location: [facility] CATH LAB;  Service: Cardiovascular;  Laterality: N/A;   SHOULDER ARTHROSCOPY WITH OPEN ROTATOR CUFF REPAIR Right 03/14/2014   Procedure: RIGHT SHOULDER ARTHROSCOPY WITH BICEPS RELEASE, OPEN SUBSCAPULA REPAIR, OPEN SUPRASPINATUS REPAIR.;  Surgeon: Cammy Copa, MD;  Location: [facility] OR;  Service: Orthopedics;  Laterality: Right;   SPHINCTEROTOMY  02/03/2022   Procedure: SPHINCTEROTOMY;  Surgeon: Jeani Hawking, MD;  Location: [facility] ENDOSCOPY;  Service: Gastroenterology;;   STUMP REVISION Right 06/13/2022   Procedure: REVISION RIGHT BELOW KNEE AMPUTATION;  Surgeon: Nadara Mustard, MD;  Location: [facility] OR;  Service: Orthopedics;  Laterality: Right;   TEE WITHOUT CARDIOVERSION N/A 02/04/2022   Procedure: TRANSESOPHAGEAL ECHOCARDIOGRAM (TEE);  Surgeon: Dolores Patty, MD;  Location: [facility] ENDOSCOPY;  Service: Cardiovascular;  Laterality: N/A;   THROMBECTOMY W/ EMBOLECTOMY Left 08/20/2022   Procedure: THROMBECTOMY OF LEFT ARM ARTERIOVENOUS FISTULA;  Surgeon: Nada Libman, MD;  Location: [facility] OR;  Service: Vascular;  Laterality: Left;   TOE AMPUTATION Right 02/24/2018   GREAT TOE AND 2ND TOE AMPUTATION   TUBAL LIGATION  1970's   Patient Active Problem List   Diagnosis Date Noted   Hyperosmolar hyperglycemic state (HHS) (HCC) 02/05/2023   Depression 01/29/2023   Nausea 01/24/2023   Intractable nausea and vomiting 01/20/2023   Proctitis 01/15/2023   Acute on chronic anemia 11/25/2022   Right below-knee amputee (HCC) 11/20/2022   Cellulitis of left lower extremity 11/14/2022   Maggot infestation 11/12/2022   Complicated wound infection 11/10/2022   Secondary hypercoagulable state (HCC) 09/04/2022   Right sided abdominal pain 08/31/2022   Constipation 06/07/2022   History of Clostridioides difficile colitis 06/06/2022   Below-knee amputation of right lower  extremity (HCC) 06/06/2022   Diverticulitis 06/05/2022   Stercoral colitis 06/05/2022   C. 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) 04/07/2022   Allergy, unspecified, initial encounter 04/07/2022   Dependence on renal dialysis (HCC) 04/07/2022   Gout  due to renal impairment, unspecified site 04/07/2022   Hypertensive heart and chronic kidney disease with heart failure and with stage 5 chronic kidney disease, or end stage renal disease (HCC) 04/07/2022   Personal history of transient ischemic attack (TIA), and cerebral infarction without residual deficits 04/07/2022   Renal osteodystrophy 04/07/2022   Venous stasis ulcer of right calf (HCC) 03/31/2022   Fistula, colovaginal 03/26/2022   Diarrhea 03/26/2022   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   Sepsis without acute organ dysfunction (HCC)    Bacteremia    Acute pancreatitis 02/01/2022   Abdominal pain 02/01/2022   SIRS (systemic inflammatory response syndrome) (HCC) 02/01/2022   Transaminitis 02/01/2022   History of anemia due to chronic kidney disease 02/01/2022   Paroxysmal atrial fibrillation (HCC) 02/01/2022   Uncontrolled type 2 diabetes mellitus with hyperglycemia, with long-term current use of insulin (HCC) 01/14/2022   NSTEMI (non-ST elevated myocardial infarction) (HCC) 03/05/2021   Acute renal failure superimposed on stage 4 chronic kidney disease (HCC) 08/22/2020    Hypoalbuminemia 05/25/2020   GERD (gastroesophageal reflux disease) 05/25/2020   Pressure injury of skin 05/17/2020   Acute on chronic combined systolic and diastolic congestive heart failure (HCC) 03/07/2020   Type 2 diabetes mellitus with diabetic polyneuropathy, with long-term current use of insulin (HCC) 03/07/2020   Obesity, Class III, BMI 40-49.9 (morbid obesity) (HCC) 03/07/2020   Common bile duct (CBD) obstruction 05/28/2019   Benign neoplasm of ascending colon    Benign neoplasm of transverse colon    Benign neoplasm of descending colon    Benign neoplasm of sigmoid colon    Gastric polyps    Hyperkalemia 03/11/2019   Prolonged QT interval 03/11/2019   Acute blood loss anemia 03/11/2019   Onychomycosis 06/21/2018   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)    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 (HCC)    PVD (peripheral vascular disease) (HCC) 10/26/2017   E-coli UTI 07/27/2017   Hypothyroidism 07/27/2017   AKI (acute kidney injury) (HCC)    PAH (pulmonary artery hypertension) (HCC)    Impaired ambulation 07/19/2017   Nausea & vomiting 07/15/2017   Leg cramps 02/27/2017   Peripheral edema 01/12/2017   Diabetic neuropathy (HCC) 11/12/2016   CKD (chronic kidney disease), stage IV (HCC) 10/24/2015   Anemia of chronic disease 10/03/2015   Historical diagnosis of generalized anxiety disorder 10/03/2015   Secondary insomnia 10/03/2015   Hyperglycemia due to diabetes mellitus (HCC) 06/07/2015   Non compliance with medical treatment 04/17/2014   Rotator cuff tear 03/14/2014   Obesity 09/23/2013   Chronic HFrEF (heart failure with reduced ejection fraction) (HCC) 06/03/2013   Hypotension 12/25/2012   Hyponatremia 12/25/2012   Urinary incontinence    MDD (major depressive disorder), recurrent episode, moderate (HCC) 11/12/2010   RBBB (right bundle branch block)    Wide-complex tachycardia    Coronary artery disease    Hyperlipemia 01/22/2009    Chronic hypotension 01/22/2009    ONSET DATE: 03/09/2023 MD referral to PT  REFERRING DIAG: S88.111D (ICD-10-CM) - Below-knee amputation of right lower extremity, subsequent encounter   THERAPY DIAG:  Unsteadiness on feet  Muscle weakness (generalized)  Phantom limb syndrome with pain (HCC)  Other abnormalities of gait and mobility  Impaired functional mobility, balance, gait, and endurance  Rationale for Evaluation and Treatment: Rehabilitation  SUBJECTIVE:   SUBJECTIVE STATEMENT: She had muscle soreness in legs (LLE>RLE) from new activities last PT session.    PERTINENT HISTORY: Right TTA 05/02/22, Lymphedema, idiopathic chronic  venous HTN with ulcer, depression, colitis, diverticulitis, ESRD, gout, NSTEMI, DM2, polyneuropathy, PVD, CAD, right bundle branch block, mini strokes  PAIN:  Are you having pain? Phantom pain with no known aggravation.   PRECAUTIONS: Fall and Other: No BP LUE  WEIGHT BEARING RESTRICTIONS: No  FALLS: Has patient fallen in last 6 months? No  LIVING ENVIRONMENT: Lives with: lives with their spouse and 2 small dogs Lives in: mobile home Home Access: Ramped entrance Home layout: One level Stairs: Yes: External: 6-7 steps; can reach both Has following equipment at home: Single point cane, Environmental consultant - 2 wheeled, Environmental consultant - 4 wheeled, Wheelchair (manual), [employer], Grab bars, and Ramped entry  OCCUPATION:  retired  PLOF: prior to amputation for ~year used RW in home & community  PATIENT GOALS:  to use prosthesis to walk, get out w/c in & out of car, get to bathroom.   OBJECTIVE:  COGNITION: Overall cognitive status:  Eval on 03/23/2023:   Within functional limits for tasks assessed  POSTURE:  Eval on 03/23/2023:  rounded shoulders, forward head, increased thoracic kyphosis, flexed trunk , and weight shift left  LOWER EXTREMITY ROM: ROM Right eval Left eval  Hip flexion    Hip extension    Hip abduction    Hip adduction    Hip internal  rotation    Hip external rotation    Knee flexion    Knee extension    Ankle dorsiflexion    Ankle plantarflexion    Ankle inversion    Ankle eversion     (Blank rows = not tested)  LOWER EXTREMITY MMT:  MMT Right eval Left eval  Hip flexion 3-/5 3-/5  Hip extension 2/5 2/5  Hip abduction 2+/5 2+/5  Hip adduction    Hip internal rotation    Hip external rotation    Knee flexion 3-/5 3-/5  Knee extension 3-/5 3-/5  Ankle dorsiflexion    Ankle plantarflexion    Ankle inversion    Ankle eversion    At Evaluation all strength testing is grossly seated and functionally standing / gait. (Blank rows = not tested)  TRANSFERS: 05/04/2023: Squat pivot transfers with modA.  04/21/2023: Pt sit to stand w/c to //bar with one hand pushing on w/c and other hand on //bar with modA first time & MinA 2nd/ 3rd time.  Best is RUE on //bar & LUE on w/c.  Stand to sit reaching LUE to w/c with minA to control descent.   PT demo & verbal cues on sit to/from stand w/c to RW.  1st time modA 2 person. 2nd time maxA one person.  Stand to sit with minA and constant cueing. Scooting transfers with minA with w/c & mat direct contact & same ht.   Eval on 03/23/2023:  Sit to stand: Max A (two-person assist) from 22" w/c to rolling walker Stand to sit: Max A (two-person assist) rolling walker to wheelchair  FUNCTIONAL TESTs:   04/21/2023: Pt able to stand 60 sec with BUE on //bars with supervision.  Pt able to stand for 1 min with RW support with minA.  Eval on 03/23/2023: Patient maintained upright holding rolling walker with mod assist 2 people for safety for 30 seconds.  GAIT: 04/21/2023: Pt amb 5' in //bars with modA (2 person for safety) with PT verbal & tactile cues on sequence and weight shift.  Pt amb 10' with RW with maxA (2 person for safety) with PT verbal & tactile cues on sequence and weight shift.  Eval on 03/23/2023:  Patient took 2 steps (one per LE) with +2 max assist with  rolling walker and TTA prosthesis.  Patient adducting prosthesis stepping on left foot with no awareness.  CURRENT PROSTHETIC WEAR ASSESSMENT:  Eval on 03/23/2023:   Patient is dependent with: skin check, residual limb care, prosthetic cleaning, ply sock cleaning, correct ply sock adjustment, proper wear schedule/adjustment, and proper weight-bearing schedule/adjustment Donning prosthesis: Max A Doffing prosthesis: Min A Prosthetic wear tolerance: 2-6 hours, 1x/day, 3-4 days/week Prosthetic weight bearing tolerance: 3 minutes with limb pain Edema: pitting edema Residual limb condition: No open areas, dry skin, normal color and temperature, cylindrical shape Prosthetic description: Silicone liner with pin lock suspension, total contact socket with flexible inner socket, sach foot    TODAY'S TREATMENT:                                                                                                                             DATE:  05/06/2023: Therapeutic Activities: Nustep to/from w/c (same height) squat pivot transfer with modA. RUE contact assist only. W/c to leg press (2" higher) squat pivot transfer with modA and leg press to w/c with minA.   Therapeutic Exercise: Nustep seat 9 level 5 with LUE & BLEs for 4 min, then Level 3 with BLEs only for 4 min. Leg press BLEs 50# 10 reps 2 sets;  marching to simulate SLS in midstance with knee buckling - 25# BLEs concentric & eccentric and isometric hold while lifting contralateral off plate -  5 reps LLE lead and 5 reps RLE lead Walking w/c alternating LEs flexion 50'  and pushing w/c backwards with LE ext 100' -PT tactile & verbal cues.    Prosthetic Training: Patient has redness over patella from friction rub with liner rubbing as pt moves knee flex to/from ext.  PT verbally instructed in using baby or mineral oil prior to donning liner.  PT also cued on cleaning liner nightly with antibacterial soap and weekly with alcohol / water 1:10 ration  spray.  Alternate between 2 liners daily.  Pt & husband verbalized understanding. PT demo & verbal cues on pt assisting with don & doff as much as possible. Pt & husband verbalized understanding.    TREATMENT:  DATE:  05/04/2023: Therapeutic Activities: Nustep to/from w/c (same height) squat pivot transfer with modA. RUE contact assist only. W/c to leg press (2" higher) squat pivot transfer with maxA and leg press to w/c with modA.   Therapeutic Exercise: Nustep seat 9 level 5 with LUE & BLEs for 8 min Leg press BLEs 56# 10 reps 2 sets;  marching to simulate SLS in midstance with knee buckling - 25# BLEs concentric & eccentric and isometric hold while lifting contralateral off plate -  5 reps LLE lead and 5 reps RLE lead Walking w/c alternating LEs flexion 50'  and pushing w/c backwards with LE ext 50' -PT tactile & verbal cues.     TREATMENT:                                                                                                                             DATE:  04/21/2023: Therapeutic Activities: PT demo and verbal cues on wheelchair set up for squat pivot transfer between 2 chairs.  Patient performed squat pivot transfer wheelchair to Nustep seat going to her left with maxA using LUE (no use of RUE) and legs with constant PT cues.  Patient performed squat pivot Nustep to wheelchair going to her right with modA using LUE (no use of RUE) and legs with constant PT cues.  PT, patient and husband discussed setting up transfers going to her right for now while her right upper extremity is not able to be used.  PT patient has been also discussed transfers to bedside commode's and toilets including managing her pants.  Pt & husband verbalized understanding.  PT left message for triage nurse at cardiovascular physicians office regarding limitations with right upper  extremity.  The nurse message PT back the patient is did not use right upper extremity with more than 8 to 10 pounds for the next 5 weeks.  Therapeutic Exercise: Nustep seat 9 level 5 with LUE & BLEs for 4 min 2 sets with 2 min rest    PATIENT EDUCATION: PATIENT EDUCATED ON FOLLOWING PROSTHETIC CARE: Education details: Use of stockinette under proximal liner to decrease itching sensation, no wounds presents to PT recommended not using Vive wear under liner Skin check, Prosthetic cleaning, Propper donning, and Proper wear schedule/adjustment Prosthetic wear tolerance: 3 hours 2x/day, 4 days/week Person educated: Patient and Spouse Education method: Explanation, Tactile cues, and Verbal cues Education comprehension: verbalized understanding, verbal cues required, tactile cues required, and needs further education  HOME EXERCISE PROGRAM: Access Code: [email or web address] URL: [email or web address] Date: 04/15/2023 Prepared by: Vladimir Faster  Exercises - Supine Bridge  - 1 x daily - 4 x weekly - 2 sets - 5 reps - 2 seconds hold - Supine Lower Trunk Rotation  - 1 x daily - 4 x weekly - 2 sets - 5 reps - 5 seconds hold - Supine Heel Slide with Strap  - 1 x daily - 4 x weekly - 2 sets - 5 reps -  2-3 seconds hold - Supine Hip Abduction  - 1 x daily - 4 x weekly - 2 sets - 5 reps - 2-3 seconds hold - Seated Hip Flexion Toward Target  - 1 x daily - 4 x weekly - 2 sets - 5 reps - 5 seconds hold - Seated Eccentric Abdominal Lean Back  - 1 x daily - 4 x weekly - 2 sets - 5 reps - 5 seconds hold - Seated Sidebending  - 1 x daily - 4 x weekly - 2 sets - 5 reps - 5 seconds hold - Seated Trunk Rotation with Crossed Arms  - 1 x daily - 4 x weekly - 2 sets - 5 reps - 5 seconds hold   ASSESSMENT:  CLINICAL IMPRESSION: Patient was sore from last PT session but no more than expected with activities.  Her progress is slow due to extensive deconditioning and complex medical issues.    OBJECTIVE  IMPAIRMENTS: Abnormal gait, decreased activity tolerance, decreased balance, decreased endurance, decreased knowledge of condition, decreased knowledge of use of DME, decreased mobility, difficulty walking, decreased ROM, decreased strength, decreased safety awareness, postural dysfunction, prosthetic dependency , and pain.   ACTIVITY LIMITATIONS: standing, transfers, and locomotion level  PARTICIPATION LIMITATIONS: community activity, household mobility and dependency / burden of care on family  PERSONAL FACTORS: Age, Fitness, Past/current experiences, Time since onset of injury/illness/exacerbation, and 3+ comorbidities: see PMH  are also affecting patient's functional outcome.   REHAB POTENTIAL: Good  CLINICAL DECISION MAKING: Evolving/moderate complexity  EVALUATION COMPLEXITY: Moderate   GOALS: Goals reviewed with patient? Yes  SHORT TERM GOALS: Target date: 04/22/2023:  Patient donnes prosthesis correctly with husband's assist & verbalizes proper cleaning. Baseline: SEE OBJECTIVE DATA Goal status:   MET 04/21/2023 2.  Patient tolerates prosthesis wear on non-dailysis days >8 hrs total /day and on dialysis days >2 hours/day without skin issues or limb pain >5/10. Baseline: SEE OBJECTIVE DATA Goal status:  partially MET 04/21/2023  3.  Patient able to stand with rolling walker with 1 person modA for 1 min.  Baseline: SEE OBJECTIVE DATA Goal status: MET 04/21/2023  4. Patient ambulates 5' with RW & prosthesis with +2 maxA.  Baseline: SEE OBJECTIVE DATA Goal status: MET 04/21/2023  5. Patient sit to/from stand transfer with +1 maxA and scooting transfer with modA. Baseline: SEE OBJECTIVE DATA Goal status: MET 04/21/2023  LONG TERM GOALS: Target date: 06/17/2022  Patient & husband demonstrates & verbalizes understanding of prosthetic care to enable safe utilization of prosthesis. Baseline: SEE OBJECTIVE DATA Goal status: Ongoing 05/06/2023  Patient tolerates prosthesis  wear >80% of awake hours on non-dialysis days and >50% on dialysis days without skin issues and limb pain </= 4/10. Baseline: SEE OBJECTIVE DATA Goal status: Ongoing 05/06/2023  Scooting transfers with minA and sit to/from stand transfers with modA.  Baseline: SEE OBJECTIVE DATA Goal status: Ongoing 05/06/2023  Patient ambulates >10' with prosthesis & RW with modA.  Baseline: SEE OBJECTIVE DATA Goal status: Ongoing  05/06/2023  Patient stands with RW support with minA or less for 2 min.  Baseline: SEE OBJECTIVE DATA Goal status:   Ongoing 05/06/2023   PLAN:  PT FREQUENCY: 2x/week  PT DURATION: 12 weeks  PLANNED INTERVENTIONS: 97164- PT Re-evaluation, 97110-Therapeutic exercises, 97530- Therapeutic activity, 97112- Neuromuscular re-education, [phone]- Self Care, 60454- Gait training, [phone]- Prosthetic training, Patient/Family education, Balance training, DME instructions, Therapeutic exercises, Therapeutic activity, Neuromuscular re-education, Gait training, and Self Care  PLAN FOR NEXT SESSION    continue Nustep.  Leg press.  core stabilization on bar stool & edge of bed.  Exercises with limited use of RUE.  Restriction RUE lifting no greater than 10 pounds through 1/6.   Vladimir Faster, PT, DPT 05/06/2023, 2:53 PM

## 2023-05-11 ENCOUNTER — Ambulatory Visit: Payer: PPO | Admitting: Physical Therapy

## 2023-05-11 ENCOUNTER — Encounter: Payer: Self-pay | Admitting: Physical Therapy

## 2023-05-11 DIAGNOSIS — R2681 Unsteadiness on feet: Secondary | ICD-10-CM | POA: Diagnosis not present

## 2023-05-11 DIAGNOSIS — G546 Phantom limb syndrome with pain: Secondary | ICD-10-CM

## 2023-05-11 DIAGNOSIS — R2689 Other abnormalities of gait and mobility: Secondary | ICD-10-CM

## 2023-05-11 DIAGNOSIS — Z7409 Other reduced mobility: Secondary | ICD-10-CM

## 2023-05-11 DIAGNOSIS — M6281 Muscle weakness (generalized): Secondary | ICD-10-CM

## 2023-05-11 NOTE — Therapy (Signed)
OUTPATIENT PHYSICAL THERAPY PROSTHETIC TREATMENT   Patient Name: Susan [name] MRN: [id number] DOB:[birth year], 72 y.o., female Today's Date: 05/11/2023  PCP: Donita Brooks, MD  REFERRING PROVIDER: Nadara Mustard, MD  END OF SESSION:  PT End of Session - 05/11/23 1257     Visit Number 12    Number of Visits 25    Date for PT Re-Evaluation 06/18/23    Authorization Type [employer]    Progress Note Due on Visit 20    PT Start Time 1258    PT Stop Time 1344    PT Time Calculation (min) 46 min    Equipment Utilized During Treatment Gait belt    Activity Tolerance Patient tolerated treatment well;Patient limited by fatigue    Behavior During Therapy WFL for tasks assessed/performed                       Past Medical History:  Diagnosis Date   Anemia    hx   Anxiety    Arthritis    "generalized" (03/15/2014)   CAD (coronary artery disease)    MI in 2000 - MI  2007 - treated bare metal stent (no nuclear since then as 9/11)   Carotid artery disease (HCC)    Chronic diastolic heart failure (HCC)    a) ECHO (08/2013) EF 55-60% and RV function nl b) RHC (08/2013) RA 4, RV 30/5/7, PA 25/10 (16), PCWP 7, Fick CO/CI 6.3/2.7, PVR 1.5 WU, PA 61 and 66%   Daily headache    "~ every other day; since I fell in June" (03/15/2014)   Depression    Diabetic retinopathy (HCC)    Dyslipidemia    ESRD (end stage renal disease) (HCC)    Dialysis on Tues Thurs Sat   Exertional shortness of breath    GERD (gastroesophageal reflux disease)    History of blood transfusion    History of kidney stones    HTN (hypertension)    Hypothyroidism    Myocardial infarction (HCC)    Obesity    Osteoarthritis    PAF (paroxysmal atrial fibrillation) (HCC)    Peripheral neuropathy    bilateral feet/hands   PONV (postoperative nausea and vomiting)    RBBB (right bundle branch block)    Old   Stroke (HCC)    mini strokes   Type II diabetes mellitus (HCC)    Type II,  [name]. patient has omnipod insulin pump with Novolin R Insulin   Past Surgical History:  Procedure Laterality Date   A/V FISTULAGRAM Left 11/07/2022   Procedure: A/V Fistulagram;  Surgeon: Leonie Douglas, MD;  [facility]: [facility];  Service: Cardiovascular;  Laterality: Left;   ABDOMINAL HYSTERECTOMY  1980's   AMPUTATION Right 02/24/2018   Procedure: RIGHT FOOT GREAT TOE AND 2ND TOE AMPUTATION;  Surgeon: Nadara Mustard, MD;  Location: [facility] OR;  Service: Orthopedics;  Laterality: Right;   AMPUTATION Right 04/30/2018   Procedure: RIGHT TRANSMETATARSAL AMPUTATION;  Surgeon: Nadara Mustard, MD;  Location: [facility] OR;  Service: Orthopedics;  Laterality: Right;   AMPUTATION Right 05/02/2022   Procedure: RIGHT BELOW KNEE AMPUTATION;  Surgeon: Nadara Mustard, MD;  Location: [facility] OR;  Service: Orthopedics;  Laterality: Right;   APPLICATION OF WOUND VAC Right 06/13/2022   Procedure: APPLICATION OF WOUND VAC;  Surgeon: Nadara Mustard, MD;  Location: [facility] OR;  Service: Orthopedics;  Laterality: Right;   APPLICATION OF WOUND VAC Left  11/14/2022   Procedure: APPLICATION OF WOUND VAC;  Surgeon: Nadara Mustard, MD;  Location: [facility] OR;  Service: Orthopedics;  Laterality: Left;   AV FISTULA PLACEMENT Left 04/02/2022   Procedure: LEFT ARM ARTERIOVENOUS (AV) FISTULA CREATION;  Surgeon: Nada Libman, MD;  Location: [facility] OR;  Service: Vascular;  Laterality: Left;  PERIPHERAL NERVE BLOCK   AV FISTULA PLACEMENT Right 04/27/2023   Procedure: RIGHT ARM BRACHIOBASILIC ARTERIOVENOUS (AV) FISTULA CREATION;  Surgeon: Leonie Douglas, MD;  Location: [facility] OR;  Service: Vascular;  Laterality: Right;   BASCILIC VEIN TRANSPOSITION Left 07/31/2022   Procedure: LEFT ARM SECOND STAGE BASILIC VEIN TRANSPOSITION;  Surgeon: Nada Libman, MD;  Location: [facility] OR;  Service: Vascular;  Laterality: Left;   BIOPSY  05/27/2020   Procedure: BIOPSY;  Surgeon: Lanelle Bal, DO;  Location: [facility];  Service: Endoscopy;;    CATARACT EXTRACTION, BILATERAL Bilateral ?2013   COLONOSCOPY W/ POLYPECTOMY     COLONOSCOPY WITH PROPOFOL N/A 03/13/2019   Procedure: COLONOSCOPY WITH PROPOFOL;  Surgeon: Beverley Fiedler, MD;  Location: [facility] ENDOSCOPY;  Service: Gastroenterology;  Laterality: N/A;   CORONARY ANGIOPLASTY WITH STENT PLACEMENT  1999; 2007   "1 + 1"   ERCP N/A 02/03/2022   Procedure: ENDOSCOPIC RETROGRADE CHOLANGIOPANCREATOGRAPHY (ERCP);  Surgeon: Jeani Hawking, MD;  Location: [facility] ENDOSCOPY;  Service: Gastroenterology;  Laterality: N/A;   ESOPHAGOGASTRODUODENOSCOPY N/A 02/12/2023   Procedure: ESOPHAGOGASTRODUODENOSCOPY (EGD);  Surgeon: Jeani Hawking, MD;  Location: [facility] ENDOSCOPY;  Service: Gastroenterology;  Laterality: N/A;   ESOPHAGOGASTRODUODENOSCOPY (EGD) WITH PROPOFOL N/A 03/13/2019   Procedure: ESOPHAGOGASTRODUODENOSCOPY (EGD) WITH PROPOFOL;  Surgeon: Beverley Fiedler, MD;  Location: [facility] ENDOSCOPY;  Service: Gastroenterology;  Laterality: N/A;   ESOPHAGOGASTRODUODENOSCOPY (EGD) WITH PROPOFOL N/A 05/27/2020   Procedure: ESOPHAGOGASTRODUODENOSCOPY (EGD) WITH PROPOFOL;  Surgeon: Lanelle Bal, DO;  [facility];  Service: Endoscopy;  Laterality: N/A;   ESOPHAGOGASTRODUODENOSCOPY (EGD) WITH PROPOFOL N/A 09/03/2022   Procedure: ESOPHAGOGASTRODUODENOSCOPY (EGD) WITH PROPOFOL;  Surgeon: Jeani Hawking, MD;  Location: [facility] ENDOSCOPY;  Service: Gastroenterology;  Laterality: N/A;   EYE SURGERY Bilateral    lazer   FLEXIBLE SIGMOIDOSCOPY N/A 05/23/2022   Procedure: FLEXIBLE SIGMOIDOSCOPY;  Surgeon: Jeani Hawking, MD;  Location: [facility] ENDOSCOPY;  Service: Gastroenterology;  Laterality: N/A;   FLEXIBLE SIGMOIDOSCOPY N/A 05/24/2022   Procedure: FLEXIBLE SIGMOIDOSCOPY;  Surgeon: Imogene Burn, MD;  Location: [facility] ENDOSCOPY;  Service: Gastroenterology;  Laterality: N/A;   FLEXIBLE SIGMOIDOSCOPY N/A 09/03/2022   Procedure: FLEXIBLE SIGMOIDOSCOPY;  Surgeon: Jeani Hawking, MD;  Location: [facility] ENDOSCOPY;  Service: Gastroenterology;   Laterality: N/A;   HEMOSTASIS CLIP PLACEMENT  03/13/2019   Procedure: HEMOSTASIS CLIP PLACEMENT;  Surgeon: Beverley Fiedler, MD;  Location: [facility] ENDOSCOPY;  Service: Gastroenterology;;   HEMOSTASIS CLIP PLACEMENT  05/23/2022   Procedure: HEMOSTASIS CLIP PLACEMENT;  Surgeon: Jeani Hawking, MD;  Location: [facility] ENDOSCOPY;  Service: Gastroenterology;;   HEMOSTASIS CONTROL  05/24/2022   Procedure: HEMOSTASIS CONTROL;  Surgeon: Imogene Burn, MD;  Location: [facility] ENDOSCOPY;  Service: Gastroenterology;;   HOT HEMOSTASIS N/A 05/23/2022   Procedure: HOT HEMOSTASIS (ARGON PLASMA COAGULATION/BICAP);  Surgeon: Jeani Hawking, MD;  Location: [facility] ENDOSCOPY;  Service: Gastroenterology;  Laterality: N/A;   I & D EXTREMITY Left 05/05/2022   Procedure: IRRIGATION AND DEBRIDEMENT LEFT ARM AV FISTULA;  Surgeon: Cephus Shelling, MD;  Location: [facility] OR;  Service: Vascular;  Laterality: Left;   I & D EXTREMITY N/A 11/14/2022   Procedure: IRRIGATION AND DEBRIDEMENT OF LOWER EXTREMITY WOUND;  Surgeon:  Nadara Mustard, MD;  Location: [facility] OR;  Service: Orthopedics;  Laterality: N/A;   INSERTION OF DIALYSIS CATHETER Right 04/02/2022   Procedure: INSERTION OF TUNNELED DIALYSIS CATHETER;  Surgeon: Nada Libman, MD;  Location: [facility] OR;  Service: Vascular;  Laterality: Right;   KNEE ARTHROSCOPY Left 10/25/2006   POLYPECTOMY  03/13/2019   Procedure: POLYPECTOMY;  Surgeon: Beverley Fiedler, MD;  Location[facility] ENDOSCOPY;  Service: Gastroenterology;;   REMOVAL OF STONES  02/03/2022   Procedure: REMOVAL OF STONES;  Surgeon: Jeani Hawking, MD;  Location: [facility] ENDOSCOPY;  Service: Gastroenterology;;   REVISON OF ARTERIOVENOUS FISTULA Left 08/20/2022   Procedure: REVISON OF LEFT ARM ARTERIOVENOUS FISTULA;  Surgeon: Nada Libman, MD;  Location: [facility] OR;  Service: Vascular;  Laterality: Left;   RIGHT HEART CATH N/A 07/24/2017   Procedure: RIGHT HEART CATH;  Surgeon: Dolores Patty, MD;  [facility]: [facility];  Service: Cardiovascular;   Laterality: N/A;   RIGHT HEART CATHETERIZATION N/A 09/22/2013   Procedure: RIGHT HEART CATH;  Surgeon: Dolores Patty, MD;  Location: [facility] CATH LAB;  Service: Cardiovascular;  Laterality: N/A;   SHOULDER ARTHROSCOPY WITH OPEN ROTATOR CUFF REPAIR Right 03/14/2014   Procedure: RIGHT SHOULDER ARTHROSCOPY WITH BICEPS RELEASE, OPEN SUBSCAPULA REPAIR, OPEN SUPRASPINATUS REPAIR.;  Surgeon: Cammy Copa, MD;  Location: [facility] OR;  Service: Orthopedics;  Laterality: Right;   SPHINCTEROTOMY  02/03/2022   Procedure: SPHINCTEROTOMY;  Surgeon: Jeani Hawking, MD;  Location: [facility] ENDOSCOPY;  Service: Gastroenterology;;   STUMP REVISION Right 06/13/2022   Procedure: REVISION RIGHT BELOW KNEE AMPUTATION;  Surgeon: Nadara Mustard, MD;  Location: [facility] OR;  Service: Orthopedics;  Laterality: Right;   TEE WITHOUT CARDIOVERSION N/A 02/04/2022   Procedure: TRANSESOPHAGEAL ECHOCARDIOGRAM (TEE);  Surgeon: Dolores Patty, MD;  Location: [name] ENDOSCOPY;  Service: Cardiovascular;  Laterality: N/A;   THROMBECTOMY W/ EMBOLECTOMY Left 08/20/2022   Procedure: THROMBECTOMY OF LEFT ARM ARTERIOVENOUS FISTULA;  Surgeon: Nada Libman, MD;  Location: [facility] OR;  Service: Vascular;  Laterality: Left;   TOE AMPUTATION Right 02/24/2018   GREAT TOE AND 2ND TOE AMPUTATION   TUBAL LIGATION  1970's   Patient Active Problem List   Diagnosis Date Noted   Hyperosmolar hyperglycemic state (HHS) (HCC) 02/05/2023   Depression 01/29/2023   Nausea 01/24/2023   Intractable nausea and vomiting 01/20/2023   Proctitis 01/15/2023   Acute on chronic anemia 11/25/2022   Right below-knee amputee (HCC) 11/20/2022   Cellulitis of left lower extremity 11/14/2022   Maggot infestation 11/12/2022   Complicated wound infection 11/10/2022   Secondary hypercoagulable state (HCC) 09/04/2022   Right sided abdominal pain 08/31/2022   Constipation 06/07/2022   History of Clostridioides difficile colitis 06/06/2022   Below-knee amputation of right lower  extremity (HCC) 06/06/2022   Diverticulitis 06/05/2022   Stercoral colitis 06/05/2022   C. 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) 04/07/2022   Allergy, unspecified, initial encounter 04/07/2022   Dependence on renal dialysis (HCC) 04/07/2022  Gout due to renal impairment, unspecified site 04/07/2022   Hypertensive heart and chronic kidney disease with heart failure and with stage 5 chronic kidney disease, or end stage renal disease (HCC) 04/07/2022   Personal history of transient ischemic attack (TIA), and cerebral infarction without residual deficits 04/07/2022   Renal osteodystrophy 04/07/2022   Venous stasis ulcer of right calf (HCC) 03/31/2022   Fistula, colovaginal 03/26/2022   Diarrhea 03/26/2022   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   Sepsis without acute organ dysfunction (HCC)    Bacteremia    Acute pancreatitis 02/01/2022   Abdominal pain 02/01/2022   SIRS (systemic inflammatory response syndrome) (HCC) 02/01/2022   Transaminitis 02/01/2022   History of anemia due to chronic kidney disease 02/01/2022   Paroxysmal atrial fibrillation (HCC) 02/01/2022   Uncontrolled type 2 diabetes mellitus with hyperglycemia, with long-term current use of insulin (HCC) 01/14/2022   NSTEMI (non-ST elevated myocardial infarction) (HCC) 03/05/2021   Acute renal failure superimposed on stage 4 chronic kidney disease (HCC) 08/22/2020    Hypoalbuminemia 05/25/2020   GERD (gastroesophageal reflux disease) 05/25/2020   Pressure injury of skin 05/17/2020   Acute on chronic combined systolic and diastolic congestive heart failure (HCC) 03/07/2020   Type 2 diabetes mellitus with diabetic polyneuropathy, with long-term current use of insulin (HCC) 03/07/2020   Obesity, Class III, BMI 40-49.9 (morbid obesity) (HCC) 03/07/2020   Common bile duct (CBD) obstruction 05/28/2019   Benign neoplasm of ascending colon    Benign neoplasm of transverse colon    Benign neoplasm of descending colon    Benign neoplasm of sigmoid colon    Gastric polyps    Hyperkalemia 03/11/2019   Prolonged QT interval 03/11/2019   Acute blood loss anemia 03/11/2019   Onychomycosis 06/21/2018   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)    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 (HCC)    PVD (peripheral vascular disease) (HCC) 10/26/2017   E-coli UTI 07/27/2017   Hypothyroidism 07/27/2017   AKI (acute kidney injury) (HCC)    PAH (pulmonary artery hypertension) (HCC)    Impaired ambulation 07/19/2017   Nausea & vomiting 07/15/2017   Leg cramps 02/27/2017   Peripheral edema 01/12/2017   Diabetic neuropathy (HCC) 11/12/2016   CKD (chronic kidney disease), stage IV (HCC) 10/24/2015   Anemia of chronic disease 10/03/2015   Historical diagnosis of generalized anxiety disorder 10/03/2015   Secondary insomnia 10/03/2015   Hyperglycemia due to diabetes mellitus (HCC) 06/07/2015   Non compliance with medical treatment 04/17/2014   Rotator cuff tear 03/14/2014   Obesity 09/23/2013   Chronic HFrEF (heart failure with reduced ejection fraction) (HCC) 06/03/2013   Hypotension 12/25/2012   Hyponatremia 12/25/2012   Urinary incontinence    MDD (major depressive disorder), recurrent episode, moderate (HCC) 11/12/2010   RBBB (right bundle branch block)    Wide-complex tachycardia    Coronary artery disease    Hyperlipemia 01/22/2009    Chronic hypotension 01/22/2009    ONSET DATE: 03/09/2023 MD referral to PT  REFERRING DIAG: S88.111D (ICD-10-CM) - Below-knee amputation of right lower extremity, subsequent encounter   THERAPY DIAG:  Unsteadiness on feet  Muscle weakness (generalized)  Phantom limb syndrome with pain (HCC)  Other abnormalities of gait and mobility  Impaired functional mobility, balance, gait, and endurance  Rationale for Evaluation and Treatment: Rehabilitation  SUBJECTIVE:   SUBJECTIVE STATEMENT: She sat up putting together Christmas bags and her right side is sore from the twisting action.    PERTINENT HISTORY: Right  TTA 05/02/22, Lymphedema, idiopathic chronic venous HTN with ulcer, depression, colitis, diverticulitis, ESRD, gout, NSTEMI, DM2, polyneuropathy, PVD, CAD, right bundle branch block, mini strokes  PAIN:  Are you having pain? Phantom pain with no known aggravation.   PRECAUTIONS: Fall and Other: No BP LUE  WEIGHT BEARING RESTRICTIONS: No  FALLS: Has patient fallen in last 6 months? No  LIVING ENVIRONMENT: Lives with: lives with their spouse and 2 small dogs Lives in: mobile home Home Access: Ramped entrance Home layout: One level Stairs: Yes: External: 6-7 steps; can reach both Has following equipment at home: Single point cane, Environmental consultant - 2 wheeled, Environmental consultant - 4 wheeled, Wheelchair (manual), [employer], Grab bars, and Ramped entry  OCCUPATION:  retired  PLOF: prior to amputation for ~year used RW in home & community  PATIENT GOALS:  to use prosthesis to walk, get out w/c in & out of car, get to bathroom.   OBJECTIVE:  COGNITION: Overall cognitive status:  Eval on 03/23/2023:   Within functional limits for tasks assessed  POSTURE:  Eval on 03/23/2023:  rounded shoulders, forward head, increased thoracic kyphosis, flexed trunk , and weight shift left  LOWER EXTREMITY ROM: ROM Right eval Left eval  Hip flexion    Hip extension    Hip abduction    Hip adduction     Hip internal rotation    Hip external rotation    Knee flexion    Knee extension    Ankle dorsiflexion    Ankle plantarflexion    Ankle inversion    Ankle eversion     (Blank rows = not tested)  LOWER EXTREMITY MMT:  MMT Right eval Left eval  Hip flexion 3-/5 3-/5  Hip extension 2/5 2/5  Hip abduction 2+/5 2+/5  Hip adduction    Hip internal rotation    Hip external rotation    Knee flexion 3-/5 3-/5  Knee extension 3-/5 3-/5  Ankle dorsiflexion    Ankle plantarflexion    Ankle inversion    Ankle eversion    At Evaluation all strength testing is grossly seated and functionally standing / gait. (Blank rows = not tested)  TRANSFERS: 05/04/2023: Squat pivot transfers with modA.  04/21/2023: Pt sit to stand w/c to //bar with one hand pushing on w/c and other hand on //bar with modA first time & MinA 2nd/ 3rd time.  Best is RUE on //bar & LUE on w/c.  Stand to sit reaching LUE to w/c with minA to control descent.   PT demo & verbal cues on sit to/from stand w/c to RW.  1st time modA 2 person. 2nd time maxA one person.  Stand to sit with minA and constant cueing. Scooting transfers with minA with w/c & mat direct contact & same ht.   Eval on 03/23/2023:  Sit to stand: Max A (two-person assist) from 22" w/c to rolling walker Stand to sit: Max A (two-person assist) rolling walker to wheelchair  FUNCTIONAL TESTs:   04/21/2023: Pt able to stand 60 sec with BUE on //bars with supervision.  Pt able to stand for 1 min with RW support with minA.  Eval on 03/23/2023: Patient maintained upright holding rolling walker with mod assist 2 people for safety for 30 seconds.  GAIT: 04/21/2023: Pt amb 5' in //bars with modA (2 person for safety) with PT verbal & tactile cues on sequence and weight shift.  Pt amb 10' with RW with maxA (2 person for safety) with PT verbal & tactile cues on  sequence and weight shift.  Eval on 03/23/2023:  Patient took 2 steps (one per LE) with +2 max  assist with rolling walker and TTA prosthesis.  Patient adducting prosthesis stepping on left foot with no awareness.  CURRENT PROSTHETIC WEAR ASSESSMENT:  Eval on 03/23/2023:   Patient is dependent with: skin check, residual limb care, prosthetic cleaning, ply sock cleaning, correct ply sock adjustment, proper wear schedule/adjustment, and proper weight-bearing schedule/adjustment Donning prosthesis: Max A Doffing prosthesis: Min A Prosthetic wear tolerance: 2-6 hours, 1x/day, 3-4 days/week Prosthetic weight bearing tolerance: 3 minutes with limb pain Edema: pitting edema Residual limb condition: No open areas, dry skin, normal color and temperature, cylindrical shape Prosthetic description: Silicone liner with pin lock suspension, total contact socket with flexible inner socket, sach foot    TODAY'S TREATMENT:                                                                                                                             DATE:  05/11/2023: Therapeutic Activities: Squat pivot transfer  (same height adjacent) w/c to Nustep going to her left with modA & Nustep to w/c going to her right with minA.  RUE contact assist only. W/c to leg press (2" higher) squat pivot transfer with modA and leg press to w/c with minA. Sit to stand w/c to RW with RUE resting on RW to limit pressing with RUE - modA (2nd person for safety); pt stood with RW support with minA / CGA for 90 seconds - able to look to side right & left;  stand to sit with BLEs & LUE controlling descent with minA.   Therapeutic Exercise: Nustep seat 9 level 5 with LUE & BLEs for 3 min, then Level 3 with BLEs only for 5 min. Leg press BLEs 56# 10 reps 2 sets;  marching to simulate SLS in midstance with knee buckling - 25# BLEs concentric & eccentric and isometric hold while lifting contralateral off plate -  5 reps LLE lead and 5 reps RLE lead;  single leg 18# 5 reps 2 sets ea LE.      TREATMENT:                                                                                                                              DATE:  05/06/2023: Therapeutic Activities: Nustep to/from w/c (same height) squat pivot transfer with modA. RUE contact assist only. W/c  to leg press (2" higher) squat pivot transfer with modA and leg press to w/c with minA.   Therapeutic Exercise: Nustep seat 9 level 5 with LUE & BLEs for 4 min, then Level 3 with BLEs only for 4 min. Leg press BLEs 50# 10 reps 2 sets;  marching to simulate SLS in midstance with knee buckling - 25# BLEs concentric & eccentric and isometric hold while lifting contralateral off plate -  5 reps LLE lead and 5 reps RLE lead Walking w/c alternating LEs flexion 50'  and pushing w/c backwards with LE ext 100' -PT tactile & verbal cues.    Prosthetic Training: Patient has redness over patella from friction rub with liner rubbing as pt moves knee flex to/from ext.  PT verbally instructed in using baby or mineral oil prior to donning liner.  PT also cued on cleaning liner nightly with antibacterial soap and weekly with alcohol / water 1:10 ration spray.  Alternate between 2 liners daily.  Pt & husband verbalized understanding. PT demo & verbal cues on pt assisting with don & doff as much as possible. Pt & husband verbalized understanding.    TREATMENT:                                                                                                                             DATE:  05/04/2023: Therapeutic Activities: Nustep to/from w/c (same height) squat pivot transfer with modA. RUE contact assist only. W/c to leg press (2" higher) squat pivot transfer with maxA and leg press to w/c with modA.   Therapeutic Exercise: Nustep seat 9 level 5 with LUE & BLEs for 8 min Leg press BLEs 56# 10 reps 2 sets;  marching to simulate SLS in midstance with knee buckling - 25# BLEs concentric & eccentric and isometric hold while lifting contralateral off plate -  5 reps LLE lead and 5  reps RLE lead Walking w/c alternating LEs flexion 50'  and pushing w/c backwards with LE ext 50' -PT tactile & verbal cues.     PATIENT EDUCATION: PATIENT EDUCATED ON FOLLOWING PROSTHETIC CARE: Education details: Use of stockinette under proximal liner to decrease itching sensation, no wounds presents to PT recommended not using Vive wear under liner Skin check, Prosthetic cleaning, Propper donning, and Proper wear schedule/adjustment Prosthetic wear tolerance: 3 hours 2x/day, 4 days/week Person educated: Patient and Spouse Education method: Explanation, Tactile cues, and Verbal cues Education comprehension: verbalized understanding, verbal cues required, tactile cues required, and needs further education  HOME EXERCISE PROGRAM: Access Code: [email or web address] URL: [email or web address] Date: 04/15/2023 Prepared by: Vladimir Faster  Exercises - Supine Bridge  - 1 x daily - 4 x weekly - 2 sets - 5 reps - 2 seconds hold - Supine Lower Trunk Rotation  - 1 x daily - 4 x weekly - 2 sets - 5 reps - 5 seconds hold - Supine Heel Slide with Strap  - 1 x daily - 4 x weekly -  2 sets - 5 reps - 2-3 seconds hold - Supine Hip Abduction  - 1 x daily - 4 x weekly - 2 sets - 5 reps - 2-3 seconds hold - Seated Hip Flexion Toward Target  - 1 x daily - 4 x weekly - 2 sets - 5 reps - 5 seconds hold - Seated Eccentric Abdominal Lean Back  - 1 x daily - 4 x weekly - 2 sets - 5 reps - 5 seconds hold - Seated Sidebending  - 1 x daily - 4 x weekly - 2 sets - 5 reps - 5 seconds hold - Seated Trunk Rotation with Crossed Arms  - 1 x daily - 4 x weekly - 2 sets - 5 reps - 5 seconds hold   ASSESSMENT:  CLINICAL IMPRESSION: Patient has improved her transfers including sit to/from stand.   Her progress is slow due to extensive deconditioning and complex medical issues.    OBJECTIVE IMPAIRMENTS: Abnormal gait, decreased activity tolerance, decreased balance, decreased endurance, decreased knowledge of condition,  decreased knowledge of use of DME, decreased mobility, difficulty walking, decreased ROM, decreased strength, decreased safety awareness, postural dysfunction, prosthetic dependency , and pain.   ACTIVITY LIMITATIONS: standing, transfers, and locomotion level  PARTICIPATION LIMITATIONS: community activity, household mobility and dependency / burden of care on family  PERSONAL FACTORS: Age, Fitness, Past/current experiences, Time since onset of injury/illness/exacerbation, and 3+ comorbidities: see PMH  are also affecting patient's functional outcome.   REHAB POTENTIAL: Good  CLINICAL DECISION MAKING: Evolving/moderate complexity  EVALUATION COMPLEXITY: Moderate   GOALS: Goals reviewed with patient? Yes  SHORT TERM GOALS: Target date: 04/22/2023:  Patient donnes prosthesis correctly with husband's assist & verbalizes proper cleaning. Baseline: SEE OBJECTIVE DATA Goal status:   MET 04/21/2023 2.  Patient tolerates prosthesis wear on non-dailysis days >8 hrs total /day and on dialysis days >2 hours/day without skin issues or limb pain >5/10. Baseline: SEE OBJECTIVE DATA Goal status:  partially MET 04/21/2023  3.  Patient able to stand with rolling walker with 1 person modA for 1 min.  Baseline: SEE OBJECTIVE DATA Goal status: MET 04/21/2023  4. Patient ambulates 5' with RW & prosthesis with +2 maxA.  Baseline: SEE OBJECTIVE DATA Goal status: MET 04/21/2023  5. Patient sit to/from stand transfer with +1 maxA and scooting transfer with modA. Baseline: SEE OBJECTIVE DATA Goal status: MET 04/21/2023  LONG TERM GOALS: Target date: 06/17/2022  Patient & husband demonstrates & verbalizes understanding of prosthetic care to enable safe utilization of prosthesis. Baseline: SEE OBJECTIVE DATA Goal status: Ongoing 05/06/2023  Patient tolerates prosthesis wear >80% of awake hours on non-dialysis days and >50% on dialysis days without skin issues and limb pain </= 4/10. Baseline: SEE  OBJECTIVE DATA Goal status: Ongoing 05/06/2023  Scooting transfers with minA and sit to/from stand transfers with modA.  Baseline: SEE OBJECTIVE DATA Goal status: Ongoing 05/06/2023  Patient ambulates >10' with prosthesis & RW with modA.  Baseline: SEE OBJECTIVE DATA Goal status: Ongoing  05/06/2023  Patient stands with RW support with minA or less for 2 min.  Baseline: SEE OBJECTIVE DATA Goal status:   Ongoing 05/06/2023   PLAN:  PT FREQUENCY: 2x/week  PT DURATION: 12 weeks  PLANNED INTERVENTIONS: 97164- PT Re-evaluation, 97110-Therapeutic exercises, 97530- Therapeutic activity, 97112- Neuromuscular re-education, [phone]- Self Care, 08657- Gait training, [phone]- Prosthetic training, Patient/Family education, Balance training, DME instructions, Therapeutic exercises, Therapeutic activity, Neuromuscular re-education, Gait training, and Self Care  PLAN FOR NEXT SESSION    continue  Nustep.  Leg press.  Standing with RW support. Try stand pivot transfer with RW.   Exercises with limited use of RUE.  Restriction RUE lifting no greater than 10 pounds through 1/6.   Vladimir Faster, PT, DPT 05/11/2023, 2:33 PM

## 2023-05-12 DIAGNOSIS — Z992 Dependence on renal dialysis: Secondary | ICD-10-CM | POA: Diagnosis not present

## 2023-05-12 DIAGNOSIS — D509 Iron deficiency anemia, unspecified: Secondary | ICD-10-CM | POA: Diagnosis not present

## 2023-05-12 DIAGNOSIS — D689 Coagulation defect, unspecified: Secondary | ICD-10-CM | POA: Diagnosis not present

## 2023-05-12 DIAGNOSIS — N2581 Secondary hyperparathyroidism of renal origin: Secondary | ICD-10-CM | POA: Diagnosis not present

## 2023-05-12 DIAGNOSIS — N186 End stage renal disease: Secondary | ICD-10-CM | POA: Diagnosis not present

## 2023-05-13 ENCOUNTER — Ambulatory Visit: Payer: PPO | Admitting: Physical Therapy

## 2023-05-13 ENCOUNTER — Encounter: Payer: Self-pay | Admitting: Physical Therapy

## 2023-05-13 DIAGNOSIS — Z7409 Other reduced mobility: Secondary | ICD-10-CM | POA: Diagnosis not present

## 2023-05-13 DIAGNOSIS — G546 Phantom limb syndrome with pain: Secondary | ICD-10-CM

## 2023-05-13 DIAGNOSIS — R2689 Other abnormalities of gait and mobility: Secondary | ICD-10-CM | POA: Diagnosis not present

## 2023-05-13 DIAGNOSIS — R2681 Unsteadiness on feet: Secondary | ICD-10-CM

## 2023-05-13 DIAGNOSIS — M6281 Muscle weakness (generalized): Secondary | ICD-10-CM

## 2023-05-13 NOTE — Therapy (Signed)
OUTPATIENT PHYSICAL THERAPY PROSTHETIC TREATMENT   Patient Name: Susan [name] MRN: [id number] DOB:09-28-1950, 72 y.o., female Today's Date: 05/13/2023  PCP: Donita Brooks, MD  REFERRING PROVIDER: Nadara Mustard, MD  END OF SESSION:  PT End of Session - 05/13/23 1254     Visit Number 13    Number of Visits 25    Date for PT Re-Evaluation 06/18/23    Authorization Type [employer]    Progress Note Due on Visit 20    PT Start Time 1254    PT Stop Time 1343    PT Time Calculation (min) 49 min    Equipment Utilized During Treatment Gait belt    Activity Tolerance Patient tolerated treatment well;Patient limited by fatigue    Behavior During Therapy WFL for tasks assessed/performed                        Past Medical History:  Diagnosis Date   Anemia    hx   Anxiety    Arthritis    "generalized" (03/15/2014)   CAD (coronary artery disease)    MI in 2000 - MI  2007 - treated bare metal stent (no nuclear since then as 9/11)   Carotid artery disease (HCC)    Chronic diastolic heart failure (HCC)    a) ECHO (08/2013) EF 55-60% and RV function nl b) RHC (08/2013) RA 4, RV 30/5/7, PA 25/10 (16), PCWP 7, Fick CO/CI 6.3/2.7, PVR 1.5 WU, PA 61 and 66%   Daily headache    "~ every other day; since I fell in June" (03/15/2014)   Depression    Diabetic retinopathy (HCC)    Dyslipidemia    ESRD (end stage renal disease) (HCC)    Dialysis on Tues Thurs Sat   Exertional shortness of breath    GERD (gastroesophageal reflux disease)    History of blood transfusion    History of kidney stones    HTN (hypertension)    Hypothyroidism    Myocardial infarction (HCC)    Obesity    Osteoarthritis    PAF (paroxysmal atrial fibrillation) (HCC)    Peripheral neuropathy    bilateral feet/hands   PONV (postoperative nausea and vomiting)    RBBB (right bundle branch block)    Old   Stroke (HCC)    mini strokes   Type II diabetes mellitus (HCC)    Type II,  [name]. patient has omnipod insulin pump with Novolin R Insulin   Past Surgical History:  Procedure Laterality Date   A/V FISTULAGRAM Left 11/07/2022   Procedure: A/V Fistulagram;  Surgeon: Leonie Douglas, MD;  [facility]: [facility];  Service: Cardiovascular;  Laterality: Left;   ABDOMINAL HYSTERECTOMY  1980's   AMPUTATION Right 02/24/2018   Procedure: RIGHT FOOT GREAT TOE AND 2ND TOE AMPUTATION;  Surgeon: Nadara Mustard, MD;  Location: [facility] OR;  Service: Orthopedics;  Laterality: Right;   AMPUTATION Right 04/30/2018   Procedure: RIGHT TRANSMETATARSAL AMPUTATION;  Surgeon: Nadara Mustard, MD;  Location: [facility] OR;  Service: Orthopedics;  Laterality: Right;   AMPUTATION Right 05/02/2022   Procedure: RIGHT BELOW KNEE AMPUTATION;  Surgeon: Nadara Mustard, MD;  Location: [facility];  Service: Orthopedics;  Laterality: Right;   APPLICATION OF WOUND VAC Right 06/13/2022   Procedure: APPLICATION OF WOUND VAC;  Surgeon: Nadara Mustard, MD;  Location: [facility] OR;  Service: Orthopedics;  Laterality: Right;   APPLICATION OF WOUND VAC  Left 11/14/2022   Procedure: APPLICATION OF WOUND VAC;  Surgeon: Nadara Mustard, MD;  Location: [facility] OR;  Service: Orthopedics;  Laterality: Left;   AV FISTULA PLACEMENT Left 04/02/2022   Procedure: LEFT ARM ARTERIOVENOUS (AV) FISTULA CREATION;  Surgeon: Nada Libman, MD;  Location: [facility] OR;  Service: Vascular;  Laterality: Left;  PERIPHERAL NERVE BLOCK   AV FISTULA PLACEMENT Right 04/27/2023   Procedure: RIGHT ARM BRACHIOBASILIC ARTERIOVENOUS (AV) FISTULA CREATION;  Surgeon: Leonie Douglas, MD;  Location: [facility] OR;  Service: Vascular;  Laterality: Right;   BASCILIC VEIN TRANSPOSITION Left 07/31/2022   Procedure: LEFT ARM SECOND STAGE BASILIC VEIN TRANSPOSITION;  Surgeon: Nada Libman, MD;  Location: [facility] OR;  Service: Vascular;  Laterality: Left;   BIOPSY  05/27/2020   Procedure: BIOPSY;  Surgeon: Lanelle Bal, DO;  Location: [facility];  Service: Endoscopy;;    CATARACT EXTRACTION, BILATERAL Bilateral ?2013   COLONOSCOPY W/ POLYPECTOMY     COLONOSCOPY WITH PROPOFOL N/A 03/13/2019   Procedure: COLONOSCOPY WITH PROPOFOL;  Surgeon: Beverley Fiedler, MD;  Location: [facility] ENDOSCOPY;  Service: Gastroenterology;  Laterality: N/A;   CORONARY ANGIOPLASTY WITH STENT PLACEMENT  1999; 2007   "1 + 1"   ERCP N/A 02/03/2022   Procedure: ENDOSCOPIC RETROGRADE CHOLANGIOPANCREATOGRAPHY (ERCP);  Surgeon: Jeani Hawking, MD;  Location: [facility] ENDOSCOPY;  Service: Gastroenterology;  Laterality: N/A;   ESOPHAGOGASTRODUODENOSCOPY N/A 02/12/2023   Procedure: ESOPHAGOGASTRODUODENOSCOPY (EGD);  Surgeon: Jeani Hawking, MD;  Location: [facility] ENDOSCOPY;  Service: Gastroenterology;  Laterality: N/A;   ESOPHAGOGASTRODUODENOSCOPY (EGD) WITH PROPOFOL N/A 03/13/2019   Procedure: ESOPHAGOGASTRODUODENOSCOPY (EGD) WITH PROPOFOL;  Surgeon: Beverley Fiedler, MD;  Location: [facility] ENDOSCOPY;  Service: Gastroenterology;  Laterality: N/A;   ESOPHAGOGASTRODUODENOSCOPY (EGD) WITH PROPOFOL N/A 05/27/2020   Procedure: ESOPHAGOGASTRODUODENOSCOPY (EGD) WITH PROPOFOL;  Surgeon: Lanelle Bal, DO;  [facility];  Service: Endoscopy;  Laterality: N/A;   ESOPHAGOGASTRODUODENOSCOPY (EGD) WITH PROPOFOL N/A 09/03/2022   Procedure: ESOPHAGOGASTRODUODENOSCOPY (EGD) WITH PROPOFOL;  Surgeon: Jeani Hawking, MD;  Location: [facility] ENDOSCOPY;  Service: Gastroenterology;  Laterality: N/A;   EYE SURGERY Bilateral    lazer   FLEXIBLE SIGMOIDOSCOPY N/A 05/23/2022   Procedure: FLEXIBLE SIGMOIDOSCOPY;  Surgeon: Jeani Hawking, MD;  Location: [facility] ENDOSCOPY;  Service: Gastroenterology;  Laterality: N/A;   FLEXIBLE SIGMOIDOSCOPY N/A 05/24/2022   Procedure: FLEXIBLE SIGMOIDOSCOPY;  Surgeon: Imogene Burn, MD;  Location: [facility] ENDOSCOPY;  Service: Gastroenterology;  Laterality: N/A;   FLEXIBLE SIGMOIDOSCOPY N/A 09/03/2022   Procedure: FLEXIBLE SIGMOIDOSCOPY;  Surgeon: Jeani Hawking, MD;  Location: [facility] ENDOSCOPY;  Service: Gastroenterology;   Laterality: N/A;   HEMOSTASIS CLIP PLACEMENT  03/13/2019   Procedure: HEMOSTASIS CLIP PLACEMENT;  Surgeon: Beverley Fiedler, MD;  Location: [facility] ENDOSCOPY;  Service: Gastroenterology;;   HEMOSTASIS CLIP PLACEMENT  05/23/2022   Procedure: HEMOSTASIS CLIP PLACEMENT;  Surgeon: Jeani Hawking, MD;  Location: [facility] ENDOSCOPY;  Service: Gastroenterology;;   HEMOSTASIS CONTROL  05/24/2022   Procedure: HEMOSTASIS CONTROL;  Surgeon: Imogene Burn, MD;  Location: [facility] ENDOSCOPY;  Service: Gastroenterology;;   HOT HEMOSTASIS N/A 05/23/2022   Procedure: HOT HEMOSTASIS (ARGON PLASMA COAGULATION/BICAP);  Surgeon: Jeani Hawking, MD;  Location: [facility] ENDOSCOPY;  Service: Gastroenterology;  Laterality: N/A;   I & D EXTREMITY Left 05/05/2022   Procedure: IRRIGATION AND DEBRIDEMENT LEFT ARM AV FISTULA;  Surgeon: Cephus Shelling, MD;  Location: [facility] OR;  Service: Vascular;  Laterality: Left;   I & D EXTREMITY N/A 11/14/2022   Procedure: IRRIGATION AND DEBRIDEMENT OF LOWER EXTREMITY WOUND;  Surgeon: Nadara Mustard, MD;  Location: [facility] OR;  Service: Orthopedics;  Laterality: N/A;   INSERTION OF DIALYSIS CATHETER Right 04/02/2022   Procedure: INSERTION OF TUNNELED DIALYSIS CATHETER;  Surgeon: Nada Libman, MD;  Location: [facility] OR;  Service: Vascular;  Laterality: Right;   KNEE ARTHROSCOPY Left 10/25/2006   POLYPECTOMY  03/13/2019   Procedure: POLYPECTOMY;  Surgeon: Beverley Fiedler, MD;  Location: [facility] ENDOSCOPY;  Service: Gastroenterology;;   REMOVAL OF STONES  02/03/2022   Procedure: REMOVAL OF STONES;  Surgeon: Jeani Hawking, MD;  Location: [facility] ENDOSCOPY;  Service: Gastroenterology;;   REVISON OF ARTERIOVENOUS FISTULA Left 08/20/2022   Procedure: REVISON OF LEFT ARM ARTERIOVENOUS FISTULA;  Surgeon: Nada Libman, MD;  Location: [facility] OR;  Service: Vascular;  Laterality: Left;   RIGHT HEART CATH N/A 07/24/2017   Procedure: RIGHT HEART CATH;  Surgeon: Dolores Patty, MD;  [facility];  Service: Cardiovascular;   Laterality: N/A;   RIGHT HEART CATHETERIZATION N/A 09/22/2013   Procedure: RIGHT HEART CATH;  Surgeon: Dolores Patty, MD;  Location: [facility] CATH LAB;  Service: Cardiovascular;  Laterality: N/A;   SHOULDER ARTHROSCOPY WITH OPEN ROTATOR CUFF REPAIR Right 03/14/2014   Procedure: RIGHT SHOULDER ARTHROSCOPY WITH BICEPS RELEASE, OPEN SUBSCAPULA REPAIR, OPEN SUPRASPINATUS REPAIR.;  Surgeon: Cammy Copa, MD;  Location: [facility] OR;  Service: Orthopedics;  Laterality: Right;   SPHINCTEROTOMY  02/03/2022   Procedure: SPHINCTEROTOMY;  Surgeon: Jeani Hawking, MD;  Location: [facility] ENDOSCOPY;  Service: Gastroenterology;;   STUMP REVISION Right 06/13/2022   Procedure: REVISION RIGHT BELOW KNEE AMPUTATION;  Surgeon: Nadara Mustard, MD;  Location: [facility] OR;  Service: Orthopedics;  Laterality: Right;   TEE WITHOUT CARDIOVERSION N/A 02/04/2022   Procedure: TRANSESOPHAGEAL ECHOCARDIOGRAM (TEE);  Surgeon: Dolores Patty, MD;  Location: [facility] ENDOSCOPY;  Service: Cardiovascular;  Laterality: N/A;   THROMBECTOMY W/ EMBOLECTOMY Left 08/20/2022   Procedure: THROMBECTOMY OF LEFT ARM ARTERIOVENOUS FISTULA;  Surgeon: Nada Libman, MD;  Location: [facility] OR;  Service: Vascular;  Laterality: Left;   TOE AMPUTATION Right 02/24/2018   GREAT TOE AND 2ND TOE AMPUTATION   TUBAL LIGATION  1970's   Patient Active Problem List   Diagnosis Date Noted   Hyperosmolar hyperglycemic state (HHS) (HCC) 02/05/2023   Depression 01/29/2023   Nausea 01/24/2023   Intractable nausea and vomiting 01/20/2023   Proctitis 01/15/2023   Acute on chronic anemia 11/25/2022   Right below-knee amputee (HCC) 11/20/2022   Cellulitis of left lower extremity 11/14/2022   Maggot infestation 11/12/2022   Complicated wound infection 11/10/2022   Secondary hypercoagulable state (HCC) 09/04/2022   Right sided abdominal pain 08/31/2022   Constipation 06/07/2022   History of Clostridioides difficile colitis 06/06/2022   Below-knee amputation of right lower  extremity (HCC) 06/06/2022   Diverticulitis 06/05/2022   Stercoral colitis 06/05/2022   C. 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) 04/07/2022   Allergy, unspecified, initial encounter 04/07/2022   Dependence on renal dialysis (HCC)  04/07/2022   Gout due to renal impairment, unspecified site 04/07/2022   Hypertensive heart and chronic kidney disease with heart failure and with stage 5 chronic kidney disease, or end stage renal disease (HCC) 04/07/2022   Personal history of transient ischemic attack (TIA), and cerebral infarction without residual deficits 04/07/2022   Renal osteodystrophy 04/07/2022   Venous stasis ulcer of right calf (HCC) 03/31/2022   Fistula, colovaginal 03/26/2022   Diarrhea 03/26/2022   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   Sepsis without acute organ dysfunction (HCC)    Bacteremia    Acute pancreatitis 02/01/2022   Abdominal pain 02/01/2022   SIRS (systemic inflammatory response syndrome) (HCC) 02/01/2022   Transaminitis 02/01/2022   History of anemia due to chronic kidney disease 02/01/2022   Paroxysmal atrial fibrillation (HCC) 02/01/2022   Uncontrolled type 2 diabetes mellitus with hyperglycemia, with long-term current use of insulin (HCC) 01/14/2022   NSTEMI (non-ST elevated myocardial infarction) (HCC) 03/05/2021   Acute renal failure superimposed on stage 4 chronic kidney disease (HCC) 08/22/2020    Hypoalbuminemia 05/25/2020   GERD (gastroesophageal reflux disease) 05/25/2020   Pressure injury of skin 05/17/2020   Acute on chronic combined systolic and diastolic congestive heart failure (HCC) 03/07/2020   Type 2 diabetes mellitus with diabetic polyneuropathy, with long-term current use of insulin (HCC) 03/07/2020   Obesity, Class III, BMI 40-49.9 (morbid obesity) (HCC) 03/07/2020   Common bile duct (CBD) obstruction 05/28/2019   Benign neoplasm of ascending colon    Benign neoplasm of transverse colon    Benign neoplasm of descending colon    Benign neoplasm of sigmoid colon    Gastric polyps    Hyperkalemia 03/11/2019   Prolonged QT interval 03/11/2019   Acute blood loss anemia 03/11/2019   Onychomycosis 06/21/2018   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)    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 (HCC)    PVD (peripheral vascular disease) (HCC) 10/26/2017   E-coli UTI 07/27/2017   Hypothyroidism 07/27/2017   AKI (acute kidney injury) (HCC)    PAH (pulmonary artery hypertension) (HCC)    Impaired ambulation 07/19/2017   Nausea & vomiting 07/15/2017   Leg cramps 02/27/2017   Peripheral edema 01/12/2017   Diabetic neuropathy (HCC) 11/12/2016   CKD (chronic kidney disease), stage IV (HCC) 10/24/2015   Anemia of chronic disease 10/03/2015   Historical diagnosis of generalized anxiety disorder 10/03/2015   Secondary insomnia 10/03/2015   Hyperglycemia due to diabetes mellitus (HCC) 06/07/2015   Non compliance with medical treatment 04/17/2014   Rotator cuff tear 03/14/2014   Obesity 09/23/2013   Chronic HFrEF (heart failure with reduced ejection fraction) (HCC) 06/03/2013   Hypotension 12/25/2012   Hyponatremia 12/25/2012   Urinary incontinence    MDD (major depressive disorder), recurrent episode, moderate (HCC) 11/12/2010   RBBB (right bundle branch block)    Wide-complex tachycardia    Coronary artery disease    Hyperlipemia 01/22/2009    Chronic hypotension 01/22/2009    ONSET DATE: 03/09/2023 MD referral to PT  REFERRING DIAG: S88.111D (ICD-10-CM) - Below-knee amputation of right lower extremity, subsequent encounter   THERAPY DIAG:  Unsteadiness on feet  Muscle weakness (generalized)  Phantom limb syndrome with pain (HCC)  Other abnormalities of gait and mobility  Impaired functional mobility, balance, gait, and endurance  Rationale for Evaluation and Treatment: Rehabilitation  SUBJECTIVE:   SUBJECTIVE STATEMENT: Patient has muscle soreness in legs L>R from last PT session.  Patient and husband continue to report improvement in  transfers at home.  Dialysis doctor checked RUE fistula for second time and no sounds noted.     PERTINENT HISTORY: Right TTA 05/02/22, Lymphedema, idiopathic chronic venous HTN with ulcer, depression, colitis, diverticulitis, ESRD, gout, NSTEMI, DM2, polyneuropathy, PVD, CAD, right bundle branch block, mini strokes  PAIN:  Are you having pain? Phantom pain with no known aggravation.   PRECAUTIONS: Fall and Other: No BP LUE  WEIGHT BEARING RESTRICTIONS: No  FALLS: Has patient fallen in last 6 months? No  LIVING ENVIRONMENT: Lives with: lives with their spouse and 2 small dogs Lives in: mobile home Home Access: Ramped entrance Home layout: One level Stairs: Yes: External: 6-7 steps; can reach both Has following equipment at home: Single point cane, Environmental consultant - 2 wheeled, Environmental consultant - 4 wheeled, Wheelchair (manual), [employer], Grab bars, and Ramped entry  OCCUPATION:  retired  PLOF: prior to amputation for ~year used RW in home & community  PATIENT GOALS:  to use prosthesis to walk, get out w/c in & out of car, get to bathroom.   OBJECTIVE:  COGNITION: Overall cognitive status:  Eval on 03/23/2023:   Within functional limits for tasks assessed  POSTURE:  Eval on 03/23/2023:  rounded shoulders, forward head, increased thoracic kyphosis, flexed trunk , and weight shift  left  LOWER EXTREMITY ROM: ROM Right eval Left eval  Hip flexion    Hip extension    Hip abduction    Hip adduction    Hip internal rotation    Hip external rotation    Knee flexion    Knee extension    Ankle dorsiflexion    Ankle plantarflexion    Ankle inversion    Ankle eversion     (Blank rows = not tested)  LOWER EXTREMITY MMT:  MMT Right eval Left eval  Hip flexion 3-/5 3-/5  Hip extension 2/5 2/5  Hip abduction 2+/5 2+/5  Hip adduction    Hip internal rotation    Hip external rotation    Knee flexion 3-/5 3-/5  Knee extension 3-/5 3-/5  Ankle dorsiflexion    Ankle plantarflexion    Ankle inversion    Ankle eversion    At Evaluation all strength testing is grossly seated and functionally standing / gait. (Blank rows = not tested)  TRANSFERS: 05/04/2023: Squat pivot transfers with modA.  04/21/2023: Pt sit to stand w/c to //bar with one hand pushing on w/c and other hand on //bar with modA first time & MinA 2nd/ 3rd time.  Best is RUE on //bar & LUE on w/c.  Stand to sit reaching LUE to w/c with minA to control descent.   PT demo & verbal cues on sit to/from stand w/c to RW.  1st time modA 2 person. 2nd time maxA one person.  Stand to sit with minA and constant cueing. Scooting transfers with minA with w/c & mat direct contact & same ht.   Eval on 03/23/2023:  Sit to stand: Max A (two-person assist) from 22" w/c to rolling walker Stand to sit: Max A (two-person assist) rolling walker to wheelchair  FUNCTIONAL TESTs:   04/21/2023: Pt able to stand 60 sec with BUE on //bars with supervision.  Pt able to stand for 1 min with RW support with minA.  Eval on 03/23/2023: Patient maintained upright holding rolling walker with mod assist 2 people for safety for 30 seconds.  GAIT: 04/21/2023: Pt amb 5' in //bars with modA (2 person for safety) with PT verbal & tactile cues on  sequence and weight shift.  Pt amb 10' with RW with maxA (2 person for safety) with PT  verbal & tactile cues on sequence and weight shift.  Eval on 03/23/2023:  Patient took 2 steps (one per LE) with +2 max assist with rolling walker and TTA prosthesis.  Patient adducting prosthesis stepping on left foot with no awareness.  CURRENT PROSTHETIC WEAR ASSESSMENT:  Eval on 03/23/2023:   Patient is dependent with: skin check, residual limb care, prosthetic cleaning, ply sock cleaning, correct ply sock adjustment, proper wear schedule/adjustment, and proper weight-bearing schedule/adjustment Donning prosthesis: Max A Doffing prosthesis: Min A Prosthetic wear tolerance: 2-6 hours, 1x/day, 3-4 days/week Prosthetic weight bearing tolerance: 3 minutes with limb pain Edema: pitting edema Residual limb condition: No open areas, dry skin, normal color and temperature, cylindrical shape Prosthetic description: Silicone liner with pin lock suspension, total contact socket with flexible inner socket, sach foot    TODAY'S TREATMENT:                                                                                                                             DATE:  05/13/2023: Therapeutic Activities: Stand pivot transfer with RW  w/c to Nustep going to her left with modA (2 person for safety) & Nustep to w/c going to her right with modA.  RUE contact assist onRW. PT demo & verbal cues on technique & set-up W/c to leg press (2" higher) squat pivot transfer with modA and leg press to w/c with minA.   Therapeutic Exercise: Nustep seat 9 level 5 with LUE & BLEs for 4 min, then Level 3 with BLEs only for 4 min. Leg press BLEs 56# 10 reps 2 sets;  marching to simulate SLS in midstance with knee buckling - 25# BLEs concentric & eccentric and isometric hold while lifting contralateral off plate -  5 reps LLE lead and 5 reps RLE lead;  single leg 18# 5 reps 2 sets ea LE.  Prosthetic Training: No changes to residual limb.  Socket rotates on limb with activities.  Pt is wearing >10 ply fit.  PT  recommended setting up appointment with prosthetist after the holidays to have pads added to sock. Pt & husband verbalized understanding.   TREATMENT:  DATE:  05/11/2023: Therapeutic Activities: Squat pivot transfer  (same height adjacent) w/c to Nustep going to her left with modA & Nustep to w/c going to her right with minA.  RUE contact assist only. W/c to leg press (2" higher) squat pivot transfer with modA and leg press to w/c with minA. Sit to stand w/c to RW with RUE resting on RW to limit pressing with RUE - modA (2nd person for safety); pt stood with RW support with minA / CGA for 90 seconds - able to look to side right & left;  stand to sit with BLEs & LUE controlling descent with minA.   Therapeutic Exercise: Nustep seat 9 level 5 with LUE & BLEs for 3 min, then Level 3 with BLEs only for 5 min. Leg press BLEs 56# 10 reps 2 sets;  marching to simulate SLS in midstance with knee buckling - 25# BLEs concentric & eccentric and isometric hold while lifting contralateral off plate -  5 reps LLE lead and 5 reps RLE lead;  single leg 18# 5 reps 2 sets ea LE.      TREATMENT:                                                                                                                             DATE:  05/06/2023: Therapeutic Activities: Nustep to/from w/c (same height) squat pivot transfer with modA. RUE contact assist only. W/c to leg press (2" higher) squat pivot transfer with modA and leg press to w/c with minA.   Therapeutic Exercise: Nustep seat 9 level 5 with LUE & BLEs for 4 min, then Level 3 with BLEs only for 4 min. Leg press BLEs 50# 10 reps 2 sets;  marching to simulate SLS in midstance with knee buckling - 25# BLEs concentric & eccentric and isometric hold while lifting contralateral off plate -  5 reps LLE lead and 5 reps RLE lead Walking w/c  alternating LEs flexion 50'  and pushing w/c backwards with LE ext 100' -PT tactile & verbal cues.    Prosthetic Training: Patient has redness over patella from friction rub with liner rubbing as pt moves knee flex to/from ext.  PT verbally instructed in using baby or mineral oil prior to donning liner.  PT also cued on cleaning liner nightly with antibacterial soap and weekly with alcohol / water 1:10 ration spray.  Alternate between 2 liners daily.  Pt & husband verbalized understanding. PT demo & verbal cues on pt assisting with don & doff as much as possible. Pt & husband verbalized understanding.     PATIENT EDUCATION: PATIENT EDUCATED ON FOLLOWING PROSTHETIC CARE: Education details: Use of stockinette under proximal liner to decrease itching sensation, no wounds presents to PT recommended not using Vive wear under liner Skin check, Prosthetic cleaning, Propper donning, and Proper wear schedule/adjustment Prosthetic wear tolerance: 3 hours 2x/day, 4 days/week Person educated: Patient and Spouse Education method: Explanation, Tactile cues, and Verbal cues  Education comprehension: verbalized understanding, verbal cues required, tactile cues required, and needs further education  HOME EXERCISE PROGRAM: Access Code: [email or web address] URL: [email or web address] Date: 04/15/2023 Prepared by: Vladimir Faster  Exercises - Supine Bridge  - 1 x daily - 4 x weekly - 2 sets - 5 reps - 2 seconds hold - Supine Lower Trunk Rotation  - 1 x daily - 4 x weekly - 2 sets - 5 reps - 5 seconds hold - Supine Heel Slide with Strap  - 1 x daily - 4 x weekly - 2 sets - 5 reps - 2-3 seconds hold - Supine Hip Abduction  - 1 x daily - 4 x weekly - 2 sets - 5 reps - 2-3 seconds hold - Seated Hip Flexion Toward Target  - 1 x daily - 4 x weekly - 2 sets - 5 reps - 5 seconds hold - Seated Eccentric Abdominal Lean Back  - 1 x daily - 4 x weekly - 2 sets - 5 reps - 5 seconds hold - Seated Sidebending  - 1 x daily  - 4 x weekly - 2 sets - 5 reps - 5 seconds hold - Seated Trunk Rotation with Crossed Arms  - 1 x daily - 4 x weekly - 2 sets - 5 reps - 5 seconds hold   ASSESSMENT:  CLINICAL IMPRESSION: PT progressed to stand pivot transfers which is one portion of her ability to transfer to toilet.    Patient continues to benefit from skilled PT.  Her progress is slow due to extensive deconditioning and complex medical issues.    OBJECTIVE IMPAIRMENTS: Abnormal gait, decreased activity tolerance, decreased balance, decreased endurance, decreased knowledge of condition, decreased knowledge of use of DME, decreased mobility, difficulty walking, decreased ROM, decreased strength, decreased safety awareness, postural dysfunction, prosthetic dependency , and pain.   ACTIVITY LIMITATIONS: standing, transfers, and locomotion level  PARTICIPATION LIMITATIONS: community activity, household mobility and dependency / burden of care on family  PERSONAL FACTORS: Age, Fitness, Past/current experiences, Time since onset of injury/illness/exacerbation, and 3+ comorbidities: see PMH  are also affecting patient's functional outcome.   REHAB POTENTIAL: Good  CLINICAL DECISION MAKING: Evolving/moderate complexity  EVALUATION COMPLEXITY: Moderate   GOALS: Goals reviewed with patient? Yes  SHORT TERM GOALS: Target date: 04/22/2023:  Patient donnes prosthesis correctly with husband's assist & verbalizes proper cleaning. Baseline: SEE OBJECTIVE DATA Goal status:   MET 04/21/2023 2.  Patient tolerates prosthesis wear on non-dailysis days >8 hrs total /day and on dialysis days >2 hours/day without skin issues or limb pain >5/10. Baseline: SEE OBJECTIVE DATA Goal status:  partially MET 04/21/2023  3.  Patient able to stand with rolling walker with 1 person modA for 1 min.  Baseline: SEE OBJECTIVE DATA Goal status: MET 04/21/2023  4. Patient ambulates 5' with RW & prosthesis with +2 maxA.  Baseline: SEE OBJECTIVE  DATA Goal status: MET 04/21/2023  5. Patient sit to/from stand transfer with +1 maxA and scooting transfer with modA. Baseline: SEE OBJECTIVE DATA Goal status: MET 04/21/2023  LONG TERM GOALS: Target date: 06/17/2022  Patient & husband demonstrates & verbalizes understanding of prosthetic care to enable safe utilization of prosthesis. Baseline: SEE OBJECTIVE DATA Goal status: Ongoing 05/06/2023  Patient tolerates prosthesis wear >80% of awake hours on non-dialysis days and >50% on dialysis days without skin issues and limb pain </= 4/10. Baseline: SEE OBJECTIVE DATA Goal status: Ongoing 05/06/2023  Scooting transfers with minA and sit to/from stand transfers with modA.  Baseline: SEE OBJECTIVE  DATA Goal status: Ongoing 05/06/2023  Patient ambulates >10' with prosthesis & RW with modA.  Baseline: SEE OBJECTIVE DATA Goal status: Ongoing  05/06/2023  Patient stands with RW support with minA or less for 2 min.  Baseline: SEE OBJECTIVE DATA Goal status:   Ongoing 05/06/2023   PLAN:  PT FREQUENCY: 2x/week  PT DURATION: 12 weeks  PLANNED INTERVENTIONS: 97164- PT Re-evaluation, 97110-Therapeutic exercises, 97530- Therapeutic activity, 97112- Neuromuscular re-education, [phone]- Self Care, 43329- Gait training, [phone]- Prosthetic training, Patient/Family education, Balance training, DME instructions, Therapeutic exercises, Therapeutic activity, Neuromuscular re-education, Gait training, and Self Care  PLAN FOR NEXT SESSION      continue Nustep.  Leg press.  Standing with RW support. Stand pivot transfer with RW.   Exercises with limited use of RUE.  Restriction RUE lifting no greater than 10 pounds through 1/6.   Vladimir Faster, PT, DPT 05/13/2023, 2:55 PM

## 2023-05-18 ENCOUNTER — Encounter: Payer: PPO | Admitting: Physical Therapy

## 2023-05-18 DIAGNOSIS — I2721 Secondary pulmonary arterial hypertension: Secondary | ICD-10-CM | POA: Diagnosis not present

## 2023-05-18 DIAGNOSIS — Z992 Dependence on renal dialysis: Secondary | ICD-10-CM | POA: Diagnosis not present

## 2023-05-18 DIAGNOSIS — D509 Iron deficiency anemia, unspecified: Secondary | ICD-10-CM | POA: Diagnosis not present

## 2023-05-18 DIAGNOSIS — N186 End stage renal disease: Secondary | ICD-10-CM | POA: Diagnosis not present

## 2023-05-18 DIAGNOSIS — I5022 Chronic systolic (congestive) heart failure: Secondary | ICD-10-CM | POA: Diagnosis not present

## 2023-05-18 DIAGNOSIS — N2581 Secondary hyperparathyroidism of renal origin: Secondary | ICD-10-CM | POA: Diagnosis not present

## 2023-05-18 DIAGNOSIS — D689 Coagulation defect, unspecified: Secondary | ICD-10-CM | POA: Diagnosis not present

## 2023-05-25 ENCOUNTER — Encounter: Payer: PPO | Admitting: Physical Therapy

## 2023-05-25 DIAGNOSIS — J9611 Chronic respiratory failure with hypoxia: Secondary | ICD-10-CM | POA: Diagnosis not present

## 2023-05-25 DIAGNOSIS — Z992 Dependence on renal dialysis: Secondary | ICD-10-CM | POA: Diagnosis not present

## 2023-05-25 DIAGNOSIS — D689 Coagulation defect, unspecified: Secondary | ICD-10-CM | POA: Diagnosis not present

## 2023-05-25 DIAGNOSIS — K219 Gastro-esophageal reflux disease without esophagitis: Secondary | ICD-10-CM | POA: Diagnosis not present

## 2023-05-25 DIAGNOSIS — I5042 Chronic combined systolic (congestive) and diastolic (congestive) heart failure: Secondary | ICD-10-CM | POA: Diagnosis not present

## 2023-05-25 DIAGNOSIS — K626 Ulcer of anus and rectum: Secondary | ICD-10-CM | POA: Diagnosis not present

## 2023-05-25 DIAGNOSIS — M6281 Muscle weakness (generalized): Secondary | ICD-10-CM | POA: Diagnosis not present

## 2023-05-25 DIAGNOSIS — D509 Iron deficiency anemia, unspecified: Secondary | ICD-10-CM | POA: Diagnosis not present

## 2023-05-25 DIAGNOSIS — I5043 Acute on chronic combined systolic (congestive) and diastolic (congestive) heart failure: Secondary | ICD-10-CM | POA: Diagnosis not present

## 2023-05-25 DIAGNOSIS — N186 End stage renal disease: Secondary | ICD-10-CM | POA: Diagnosis not present

## 2023-05-25 DIAGNOSIS — N2581 Secondary hyperparathyroidism of renal origin: Secondary | ICD-10-CM | POA: Diagnosis not present

## 2023-05-26 ENCOUNTER — Ambulatory Visit: Payer: PPO | Admitting: Physical Therapy

## 2023-05-26 ENCOUNTER — Encounter: Payer: Self-pay | Admitting: Physical Therapy

## 2023-05-26 DIAGNOSIS — R2689 Other abnormalities of gait and mobility: Secondary | ICD-10-CM

## 2023-05-26 DIAGNOSIS — G546 Phantom limb syndrome with pain: Secondary | ICD-10-CM

## 2023-05-26 DIAGNOSIS — Z7409 Other reduced mobility: Secondary | ICD-10-CM | POA: Diagnosis not present

## 2023-05-26 DIAGNOSIS — M6281 Muscle weakness (generalized): Secondary | ICD-10-CM

## 2023-05-26 DIAGNOSIS — R2681 Unsteadiness on feet: Secondary | ICD-10-CM

## 2023-05-26 DIAGNOSIS — N186 End stage renal disease: Secondary | ICD-10-CM | POA: Diagnosis not present

## 2023-05-26 DIAGNOSIS — N179 Acute kidney failure, unspecified: Secondary | ICD-10-CM | POA: Diagnosis not present

## 2023-05-26 DIAGNOSIS — Z992 Dependence on renal dialysis: Secondary | ICD-10-CM | POA: Diagnosis not present

## 2023-05-26 NOTE — Therapy (Signed)
 OUTPATIENT PHYSICAL THERAPY PROSTHETIC TREATMENT   Patient Name: Susan [name] MRN: [id number] DOB:12-Mar-1951, 72 y.o., female Today's Date: 05/26/2023  PCP: Duanne Butler DASEN, MD  REFERRING PROVIDER: Harden Jerona GAILS, MD  END OF SESSION:  PT End of Session - 05/26/23 1428     Visit Number 14    Number of Visits 25    Date for PT Re-Evaluation 06/18/23    Authorization Type [employer]    Progress Note Due on Visit 20    PT Start Time 1428    PT Stop Time 1515    PT Time Calculation (min) 47 min    Equipment Utilized During Treatment Gait belt    Activity Tolerance Patient tolerated treatment well;Patient limited by fatigue    Behavior During Therapy WFL for tasks assessed/performed                        Past Medical History:  Diagnosis Date   Anemia    hx   Anxiety    Arthritis    generalized (03/15/2014)   CAD (coronary artery disease)    MI in 2000 - MI  2007 - treated bare metal stent (no nuclear since then as 9/11)   Carotid artery disease (HCC)    Chronic diastolic heart failure (HCC)    a) ECHO (08/2013) EF 55-60% and RV function nl b) RHC (08/2013) RA 4, RV 30/5/7, PA 25/10 (16), PCWP 7, Fick CO/CI 6.3/2.7, PVR 1.5 WU, PA 61 and 66%   Daily headache    ~ every other day; since I fell in June (03/15/2014)   Depression    Diabetic retinopathy (HCC)    Dyslipidemia    ESRD (end stage renal disease) (HCC)    Dialysis on Tues Thurs Sat   Exertional shortness of breath    GERD (gastroesophageal reflux disease)    History of blood transfusion    History of kidney stones    HTN (hypertension)    Hypothyroidism    Myocardial infarction (HCC)    Obesity    Osteoarthritis    PAF (paroxysmal atrial fibrillation) (HCC)    Peripheral neuropathy    bilateral feet/hands   PONV (postoperative nausea and vomiting)    RBBB (right bundle branch block)    Old   Stroke (HCC)    mini strokes   Type II diabetes mellitus (HCC)    Type II,  Barnet libre left upper arm. patient has omnipod insulin  pump with Novolin R Insulin    Past Surgical History:  Procedure Laterality Date   A/V FISTULAGRAM Left 11/07/2022   Procedure: A/V Fistulagram;  Surgeon: Magda Debby SAILOR, MD;  [facility]: [facility];  Service: Cardiovascular;  Laterality: Left;   ABDOMINAL HYSTERECTOMY  1980's   AMPUTATION Right 02/24/2018   Procedure: RIGHT FOOT GREAT TOE AND 2ND TOE AMPUTATION;  Surgeon: Harden Jerona GAILS, MD;  Location: [facility] OR;  Service: Orthopedics;  Laterality: Right;   AMPUTATION Right 04/30/2018   Procedure: RIGHT TRANSMETATARSAL AMPUTATION;  Surgeon: Harden Jerona GAILS, MD;  Location: [facility] OR;  Service: Orthopedics;  Laterality: Right;   AMPUTATION Right 05/02/2022   Procedure: RIGHT BELOW KNEE AMPUTATION;  Surgeon: Harden Jerona GAILS, MD;  Location: [facility] OR;  Service: Orthopedics;  Laterality: Right;   APPLICATION OF WOUND VAC Right 06/13/2022   Procedure: APPLICATION OF WOUND VAC;  Surgeon: Harden Jerona GAILS, MD;  Location: [facility] OR;  Service: Orthopedics;  Laterality: Right;   APPLICATION OF WOUND VAC  Left 11/14/2022   Procedure: APPLICATION OF WOUND VAC;  Surgeon: Harden Jerona GAILS, MD;  Location: [facility] OR;  Service: Orthopedics;  Laterality: Left;   AV FISTULA PLACEMENT Left 04/02/2022   Procedure: LEFT ARM ARTERIOVENOUS (AV) FISTULA CREATION;  Surgeon: Serene Gaile ORN, MD;  Location: [facility] OR;  Service: Vascular;  Laterality: Left;  PERIPHERAL NERVE BLOCK   AV FISTULA PLACEMENT Right 04/27/2023   Procedure: RIGHT ARM BRACHIOBASILIC ARTERIOVENOUS (AV) FISTULA CREATION;  Surgeon: Magda Debby SAILOR, MD;  Location: [facility] OR;  Service: Vascular;  Laterality: Right;   BASCILIC VEIN TRANSPOSITION Left 07/31/2022   Procedure: LEFT ARM SECOND STAGE BASILIC VEIN TRANSPOSITION;  Surgeon: Serene Gaile ORN, MD;  [facility]: [facility] OR;  Service: Vascular;  Laterality: Left;   BIOPSY  05/27/2020   Procedure: BIOPSY;  Surgeon: Cindie Carlin POUR, DO;  Location: [facility];  Service: Endoscopy;;    CATARACT EXTRACTION, BILATERAL Bilateral ?2013   COLONOSCOPY W/ POLYPECTOMY     COLONOSCOPY WITH PROPOFOL  N/A 03/13/2019   Procedure: COLONOSCOPY WITH PROPOFOL ;  Surgeon: Albertus Gordy HERO, MD;  Location: [facility] ENDOSCOPY;  Service: Gastroenterology;  Laterality: N/A;   CORONARY ANGIOPLASTY WITH STENT PLACEMENT  1999; 2007   1 + 1   ERCP N/A 02/03/2022   Procedure: ENDOSCOPIC RETROGRADE CHOLANGIOPANCREATOGRAPHY (ERCP);  Surgeon: Rollin Dover, MD;  Location: [facility] ENDOSCOPY;  Service: Gastroenterology;  Laterality: N/A;   ESOPHAGOGASTRODUODENOSCOPY N/A 02/12/2023   Procedure: ESOPHAGOGASTRODUODENOSCOPY (EGD);  Surgeon: Rollin Dover, MD;  Location: [facility] ENDOSCOPY;  Service: Gastroenterology;  Laterality: N/A;   ESOPHAGOGASTRODUODENOSCOPY (EGD) WITH PROPOFOL  N/A 03/13/2019   Procedure: ESOPHAGOGASTRODUODENOSCOPY (EGD) WITH PROPOFOL ;  Surgeon: Albertus Gordy HERO, MD;  Location: [facility] ENDOSCOPY;  Service: Gastroenterology;  Laterality: N/A;   ESOPHAGOGASTRODUODENOSCOPY (EGD) WITH PROPOFOL  N/A 05/27/2020   Procedure: ESOPHAGOGASTRODUODENOSCOPY (EGD) WITH PROPOFOL ;  Surgeon: Cindie Carlin POUR, DO;  [facility];  Service: Endoscopy;  Laterality: N/A;   ESOPHAGOGASTRODUODENOSCOPY (EGD) WITH PROPOFOL  N/A 09/03/2022   Procedure: ESOPHAGOGASTRODUODENOSCOPY (EGD) WITH PROPOFOL ;  Surgeon: Rollin Dover, MD;  Location: [facility] ENDOSCOPY;  Service: Gastroenterology;  Laterality: N/A;   EYE SURGERY Bilateral    lazer   FLEXIBLE SIGMOIDOSCOPY N/A 05/23/2022   Procedure: FLEXIBLE SIGMOIDOSCOPY;  Surgeon: Rollin Dover, MD;  Location: [facility] ENDOSCOPY;  Service: Gastroenterology;  Laterality: N/A;   FLEXIBLE SIGMOIDOSCOPY N/A 05/24/2022   Procedure: FLEXIBLE SIGMOIDOSCOPY;  Surgeon: Federico Rosario BROCKS, MD;  Location: [facility] ENDOSCOPY;  Service: Gastroenterology;  Laterality: N/A;   FLEXIBLE SIGMOIDOSCOPY N/A 09/03/2022   Procedure: FLEXIBLE SIGMOIDOSCOPY;  Surgeon: Rollin Dover, MD;  Location: [facility] ENDOSCOPY;  Service: Gastroenterology;   Laterality: N/A;   HEMOSTASIS CLIP PLACEMENT  03/13/2019   Procedure: HEMOSTASIS CLIP PLACEMENT;  Surgeon: Albertus Gordy HERO, MD;  Location: [facility] ENDOSCOPY;  Service: Gastroenterology;;   HEMOSTASIS CLIP PLACEMENT  05/23/2022   Procedure: HEMOSTASIS CLIP PLACEMENT;  Surgeon: Rollin Dover, MD;  Location: [facility] ENDOSCOPY;  Service: Gastroenterology;;   HEMOSTASIS CONTROL  05/24/2022   Procedure: HEMOSTASIS CONTROL;  Surgeon: Federico Rosario BROCKS, MD;  Location: [facility] ENDOSCOPY;  Service: Gastroenterology;;   HOT HEMOSTASIS N/A 05/23/2022   Procedure: HOT HEMOSTASIS (ARGON PLASMA COAGULATION/BICAP);  Surgeon: Rollin Dover, MD;  Location: [facility] ENDOSCOPY;  Service: Gastroenterology;  Laterality: N/A;   I & D EXTREMITY Left 05/05/2022   Procedure: IRRIGATION AND DEBRIDEMENT LEFT ARM AV FISTULA;  Surgeon: Gretta Lonni PARAS, MD;  Location: [facility] OR;  Service: Vascular;  Laterality: Left;   I & D EXTREMITY N/A 11/14/2022   Procedure: IRRIGATION AND DEBRIDEMENT OF LOWER EXTREMITY WOUND;  Surgeon: Harden Jerona GAILS, MD;  Location: [facility] OR;  Service: Orthopedics;  Laterality: N/A;   INSERTION OF DIALYSIS CATHETER Right 04/02/2022   Procedure: INSERTION OF TUNNELED DIALYSIS CATHETER;  Surgeon: Serene Gaile ORN, MD;  Location: [facility] OR;  Service: Vascular;  Laterality: Right;   KNEE ARTHROSCOPY Left 10/25/2006   POLYPECTOMY  03/13/2019   Procedure: POLYPECTOMY;  Surgeon: Albertus Gordy HERO, MD;  Location: [facility] ENDOSCOPY;  Service: Gastroenterology;;   REMOVAL OF STONES  02/03/2022   Procedure: REMOVAL OF STONES;  Surgeon: Rollin Dover, MD;  Location: [facility] ENDOSCOPY;  Service: Gastroenterology;;   REVISON OF ARTERIOVENOUS FISTULA Left 08/20/2022   Procedure: REVISON OF LEFT ARM ARTERIOVENOUS FISTULA;  Surgeon: Serene Gaile ORN, MD;  Location: [facility] OR;  Service: Vascular;  Laterality: Left;   RIGHT HEART CATH N/A 07/24/2017   Procedure: RIGHT HEART CATH;  Surgeon: Cherrie Toribio SAUNDERS, MD;  Location: [facility];  Service: Cardiovascular;   Laterality: N/A;   RIGHT HEART CATHETERIZATION N/A 09/22/2013   Procedure: RIGHT HEART CATH;  Surgeon: Toribio SAUNDERS Cherrie, MD;  Location: [facility] CATH LAB;  Service: Cardiovascular;  Laterality: N/A;   SHOULDER ARTHROSCOPY WITH OPEN ROTATOR CUFF REPAIR Right 03/14/2014   Procedure: RIGHT SHOULDER ARTHROSCOPY WITH BICEPS RELEASE, OPEN SUBSCAPULA REPAIR, OPEN SUPRASPINATUS REPAIR.;  Surgeon: Cordella Glendia Hutchinson, MD;  Location: [facility] OR;  Service: Orthopedics;  Laterality: Right;   SPHINCTEROTOMY  02/03/2022   Procedure: SPHINCTEROTOMY;  Surgeon: Rollin Dover, MD;  Location: [facility] ENDOSCOPY;  Service: Gastroenterology;;   STUMP REVISION Right 06/13/2022   Procedure: REVISION RIGHT BELOW KNEE AMPUTATION;  Surgeon: Harden Jerona GAILS, MD;  Location: [facility] OR;  Service: Orthopedics;  Laterality: Right;   TEE WITHOUT CARDIOVERSION N/A 02/04/2022   Procedure: TRANSESOPHAGEAL ECHOCARDIOGRAM (TEE);  Surgeon: Cherrie Toribio SAUNDERS, MD;  Location: [facility] ENDOSCOPY;  Service: Cardiovascular;  Laterality: N/A;   THROMBECTOMY W/ EMBOLECTOMY Left 08/20/2022   Procedure: THROMBECTOMY OF LEFT ARM ARTERIOVENOUS FISTULA;  Surgeon: Serene Gaile ORN, MD;  Location: [facility] OR;  Service: Vascular;  Laterality: Left;   TOE AMPUTATION Right 02/24/2018   GREAT TOE AND 2ND TOE AMPUTATION   TUBAL LIGATION  1970's   Patient Active Problem List   Diagnosis Date Noted   Hyperosmolar hyperglycemic state (HHS) (HCC) 02/05/2023   Depression 01/29/2023   Nausea 01/24/2023   Intractable nausea and vomiting 01/20/2023   Proctitis 01/15/2023   Acute on chronic anemia 11/25/2022   Right below-knee amputee (HCC) 11/20/2022   Cellulitis of left lower extremity 11/14/2022   Maggot infestation 11/12/2022   Complicated wound infection 11/10/2022   Secondary hypercoagulable state (HCC) 09/04/2022   Right sided abdominal pain 08/31/2022   Constipation 06/07/2022   History of Clostridioides difficile colitis 06/06/2022   Below-knee amputation of right lower  extremity (HCC) 06/06/2022   Diverticulitis 06/05/2022   Stercoral colitis 06/05/2022   C. 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) 04/07/2022   Allergy, unspecified, initial encounter 04/07/2022   Dependence on renal dialysis (HCC)  04/07/2022   Gout due to renal impairment, unspecified site 04/07/2022   Hypertensive heart and chronic kidney disease with heart failure and with stage 5 chronic kidney disease, or end stage renal disease (HCC) 04/07/2022   Personal history of transient ischemic attack (TIA), and cerebral infarction without residual deficits 04/07/2022   Renal osteodystrophy 04/07/2022   Venous stasis ulcer of right calf (HCC) 03/31/2022   Fistula, colovaginal 03/26/2022   Diarrhea 03/26/2022   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   Sepsis without acute organ dysfunction (HCC)    Bacteremia    Acute pancreatitis 02/01/2022   Abdominal pain 02/01/2022   SIRS (systemic inflammatory response syndrome) (HCC) 02/01/2022   Transaminitis 02/01/2022   History of anemia due to chronic kidney disease 02/01/2022   Paroxysmal atrial fibrillation (HCC) 02/01/2022   Uncontrolled type 2 diabetes mellitus with hyperglycemia, with long-term current use of insulin  (HCC) 01/14/2022   NSTEMI (non-ST elevated myocardial infarction) (HCC) 03/05/2021   Acute renal failure superimposed on stage 4 chronic kidney disease (HCC) 08/22/2020    Hypoalbuminemia 05/25/2020   GERD (gastroesophageal reflux disease) 05/25/2020   Pressure injury of skin 05/17/2020   Acute on chronic combined systolic and diastolic congestive heart failure (HCC) 03/07/2020   Type 2 diabetes mellitus with diabetic polyneuropathy, with long-term current use of insulin  (HCC) 03/07/2020   Obesity, Class III, BMI 40-49.9 (morbid obesity) (HCC) 03/07/2020   Common bile duct (CBD) obstruction 05/28/2019   Benign neoplasm of ascending colon    Benign neoplasm of transverse colon    Benign neoplasm of descending colon    Benign neoplasm of sigmoid colon    Gastric polyps    Hyperkalemia 03/11/2019   Prolonged QT interval 03/11/2019   Acute blood loss anemia 03/11/2019   Onychomycosis 06/21/2018   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)    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 (HCC)    PVD (peripheral vascular disease) (HCC) 10/26/2017   E-coli UTI 07/27/2017   Hypothyroidism 07/27/2017   AKI (acute kidney injury) (HCC)    PAH (pulmonary artery hypertension) (HCC)    Impaired ambulation 07/19/2017   Nausea & vomiting 07/15/2017   Leg cramps 02/27/2017   Peripheral edema 01/12/2017   Diabetic neuropathy (HCC) 11/12/2016   CKD (chronic kidney disease), stage IV (HCC) 10/24/2015   Anemia of chronic disease 10/03/2015   Historical diagnosis of generalized anxiety disorder 10/03/2015   Secondary insomnia 10/03/2015   Hyperglycemia due to diabetes mellitus (HCC) 06/07/2015   Non compliance with medical treatment 04/17/2014   Rotator cuff tear 03/14/2014   Obesity 09/23/2013   Chronic HFrEF (heart failure with reduced ejection fraction) (HCC) 06/03/2013   Hypotension 12/25/2012   Hyponatremia 12/25/2012   Urinary incontinence    MDD (major depressive disorder), recurrent episode, moderate (HCC) 11/12/2010   RBBB (right bundle branch block)    Wide-complex tachycardia    Coronary artery disease    Hyperlipemia 01/22/2009    Chronic hypotension 01/22/2009    ONSET DATE: 03/09/2023 MD referral to PT  REFERRING DIAG: S88.111D (ICD-10-CM) - Below-knee amputation of right lower extremity, subsequent encounter   THERAPY DIAG:  Unsteadiness on feet  Muscle weakness (generalized)  Phantom limb syndrome with pain (HCC)  Other abnormalities of gait and mobility  Impaired functional mobility, balance, gait, and endurance  Rationale for Evaluation and Treatment: Rehabilitation  SUBJECTIVE:   SUBJECTIVE STATEMENT: No changes.  She does use w/c propelling with her feet in the house now.   PERTINENT HISTORY: Right  TTA 05/02/22, Lymphedema, idiopathic chronic venous HTN with ulcer, depression, colitis, diverticulitis, ESRD, gout, NSTEMI, DM2, polyneuropathy, PVD, CAD, right bundle branch block, mini strokes  PAIN:  Are you having pain? Phantom pain with no known aggravation.   PRECAUTIONS: Fall and Other: No BP LUE  WEIGHT BEARING RESTRICTIONS: No  FALLS: Has patient fallen in last 6 months? No  LIVING ENVIRONMENT: Lives with: lives with their spouse and 2 small dogs Lives in: mobile home Home Access: Ramped entrance Home layout: One level Stairs: Yes: External: 6-7 steps; can reach both Has following equipment at home: Single point cane, Environmental Consultant - 2 wheeled, Environmental Consultant - 4 wheeled, Wheelchair (manual), Graybar electric, Grab bars, and Ramped entry  OCCUPATION:  retired  PLOF: prior to amputation for ~year used RW in home & community  PATIENT GOALS:  to use prosthesis to walk, get out w/c in & out of car, get to bathroom.   OBJECTIVE:  COGNITION: Overall cognitive status:  Eval on 03/23/2023:   Within functional limits for tasks assessed  POSTURE:  Eval on 03/23/2023:  rounded shoulders, forward head, increased thoracic kyphosis, flexed trunk , and weight shift left  LOWER EXTREMITY ROM: ROM Right eval Left eval  Hip flexion    Hip extension    Hip abduction    Hip adduction    Hip internal  rotation    Hip external rotation    Knee flexion    Knee extension    Ankle dorsiflexion    Ankle plantarflexion    Ankle inversion    Ankle eversion     (Blank rows = not tested)  LOWER EXTREMITY MMT:  MMT Right eval Left eval  Hip flexion 3-/5 3-/5  Hip extension 2/5 2/5  Hip abduction 2+/5 2+/5  Hip adduction    Hip internal rotation    Hip external rotation    Knee flexion 3-/5 3-/5  Knee extension 3-/5 3-/5  Ankle dorsiflexion    Ankle plantarflexion    Ankle inversion    Ankle eversion    At Evaluation all strength testing is grossly seated and functionally standing / gait. (Blank rows = not tested)  TRANSFERS: 05/04/2023: Squat pivot transfers with modA.  04/21/2023: Pt sit to stand w/c to //bar with one hand pushing on w/c and other hand on //bar with modA first time & MinA 2nd/ 3rd time.  Best is RUE on //bar & LUE on w/c.  Stand to sit reaching LUE to w/c with minA to control descent.   PT demo & verbal cues on sit to/from stand w/c to RW.  1st time modA 2 person. 2nd time maxA one person.  Stand to sit with minA and constant cueing. Scooting transfers with minA with w/c & mat direct contact & same ht.   Eval on 03/23/2023:  Sit to stand: Max A (two-person assist) from 22 w/c to rolling walker Stand to sit: Max A (two-person assist) rolling walker to wheelchair  FUNCTIONAL TESTs:   04/21/2023: Pt able to stand 60 sec with BUE on //bars with supervision.  Pt able to stand for 1 min with RW support with minA.  Eval on 03/23/2023: Patient maintained upright holding rolling walker with mod assist 2 people for safety for 30 seconds.  GAIT: 04/21/2023: Pt amb 5' in //bars with modA (2 person for safety) with PT verbal & tactile cues on sequence and weight shift.  Pt amb 10' with RW with maxA (2 person for safety) with PT verbal & tactile cues on  sequence and weight shift.  Eval on 03/23/2023:  Patient took 2 steps (one per LE) with +2 max assist with  rolling walker and TTA prosthesis.  Patient adducting prosthesis stepping on left foot with no awareness.  CURRENT PROSTHETIC WEAR ASSESSMENT:  Eval on 03/23/2023:   Patient is dependent with: skin check, residual limb care, prosthetic cleaning, ply sock cleaning, correct ply sock adjustment, proper wear schedule/adjustment, and proper weight-bearing schedule/adjustment Donning prosthesis: Max A Doffing prosthesis: Min A Prosthetic wear tolerance: 2-6 hours, 1x/day, 3-4 days/week Prosthetic weight bearing tolerance: 3 minutes with limb pain Edema: pitting edema Residual limb condition: No open areas, dry skin, normal color and temperature, cylindrical shape Prosthetic description: Silicone liner with pin lock suspension, total contact socket with flexible inner socket, sach foot    TODAY'S TREATMENT:                                                                                                                             DATE:  05/26/2023: Therapeutic Activities: Stand pivot transfer with RW  w/c to Nustep going to her left with modA (2nd person for safety) & Nustep to w/c going to her right with modA.  RUE contact assist on RW. PT demo & verbal cues on technique & set-up Squat pivot transfer leg press to w/c with minA.  Therapeutic Exercise: Nustep seat 9 level 5 with LUE & BLEs for 4 min, then Level 3 with BLEs only for 4 min. Leg press BLEs 56# 10 reps 2 sets;  marching to simulate SLS in midstance with knee buckling - 25# BLEs concentric & eccentric and isometric hold while lifting contralateral off plate -  5 reps LLE lead and 5 reps RLE lead;  single leg 25# 5 reps 2 sets ea LE.  Prosthetic Training: Pt amb 5' with RW with maxA +2 person assist.  PT verbal cues on sequence. PT recommended appt with prosthetist to have pads added to socket and do an alignment check.  Once pads are added, then she will need less socks depending on size & thickness of pads.  Pt & husband verbalized  understanding.    TREATMENT:                                                                                                                             DATE:  05/13/2023: Therapeutic Activities: Stand pivot transfer with RW  w/c to Nustep going  to her left with modA (2 person for safety) & Nustep to w/c going to her right with modA.  RUE contact assist onRW. PT demo & verbal cues on technique & set-up W/c to leg press (2 higher) squat pivot transfer with modA and leg press to w/c with minA.   Therapeutic Exercise: Nustep seat 9 level 5 with LUE & BLEs for 4 min, then Level 3 with BLEs only for 4 min. Leg press BLEs 56# 10 reps 2 sets;  marching to simulate SLS in midstance with knee buckling - 25# BLEs concentric & eccentric and isometric hold while lifting contralateral off plate -  5 reps LLE lead and 5 reps RLE lead;  single leg 18# 5 reps 2 sets ea LE.  Prosthetic Training: No changes to residual limb.  Socket rotates on limb with activities.  Pt is wearing >10 ply fit.  PT recommended setting up appointment with prosthetist after the holidays to have pads added to sock. Pt & husband verbalized understanding.   TREATMENT:                                                                                                                             DATE:  05/11/2023: Therapeutic Activities: Squat pivot transfer  (same height adjacent) w/c to Nustep going to her left with modA & Nustep to w/c going to her right with minA.  RUE contact assist only. W/c to leg press (2 higher) squat pivot transfer with modA and leg press to w/c with minA. Sit to stand w/c to RW with RUE resting on RW to limit pressing with RUE - modA (2nd person for safety); pt stood with RW support with minA / CGA for 90 seconds - able to look to side right & left;  stand to sit with BLEs & LUE controlling descent with minA.   Therapeutic Exercise: Nustep seat 9 level 5 with LUE & BLEs for 3 min, then Level 3 with BLEs only  for 5 min. Leg press BLEs 56# 10 reps 2 sets;  marching to simulate SLS in midstance with knee buckling - 25# BLEs concentric & eccentric and isometric hold while lifting contralateral off plate -  5 reps LLE lead and 5 reps RLE lead;  single leg 18# 5 reps 2 sets ea LE.      TREATMENT:  DATE:  05/06/2023: Therapeutic Activities: Nustep to/from w/c (same height) squat pivot transfer with modA. RUE contact assist only. W/c to leg press (2 higher) squat pivot transfer with modA and leg press to w/c with minA.   Therapeutic Exercise: Nustep seat 9 level 5 with LUE & BLEs for 4 min, then Level 3 with BLEs only for 4 min. Leg press BLEs 50# 10 reps 2 sets;  marching to simulate SLS in midstance with knee buckling - 25# BLEs concentric & eccentric and isometric hold while lifting contralateral off plate -  5 reps LLE lead and 5 reps RLE lead Walking w/c alternating LEs flexion 50'  and pushing w/c backwards with LE ext 100' -PT tactile & verbal cues.    Prosthetic Training: Patient has redness over patella from friction rub with liner rubbing as pt moves knee flex to/from ext.  PT verbally instructed in using baby or mineral oil prior to donning liner.  PT also cued on cleaning liner nightly with antibacterial soap and weekly with alcohol  / water  1:10 ration spray.  Alternate between 2 liners daily.  Pt & husband verbalized understanding. PT demo & verbal cues on pt assisting with don & doff as much as possible. Pt & husband verbalized understanding.     PATIENT EDUCATION: PATIENT EDUCATED ON FOLLOWING PROSTHETIC CARE: Education details: Use of stockinette under proximal liner to decrease itching sensation, no wounds presents to PT recommended not using Vive wear under liner Skin check, Prosthetic cleaning, Propper donning, and Proper wear schedule/adjustment Prosthetic  wear tolerance: 3 hours 2x/day, 4 days/week Person educated: Patient and Spouse Education method: Explanation, Tactile cues, and Verbal cues Education comprehension: verbalized understanding, verbal cues required, tactile cues required, and needs further education  HOME EXERCISE PROGRAM: Access Code: [email or web address] URL: [email or web address] Date: 04/15/2023 Prepared by: Grayce Spatz  Exercises - Supine Bridge  - 1 x daily - 4 x weekly - 2 sets - 5 reps - 2 seconds hold - Supine Lower Trunk Rotation  - 1 x daily - 4 x weekly - 2 sets - 5 reps - 5 seconds hold - Supine Heel Slide with Strap  - 1 x daily - 4 x weekly - 2 sets - 5 reps - 2-3 seconds hold - Supine Hip Abduction  - 1 x daily - 4 x weekly - 2 sets - 5 reps - 2-3 seconds hold - Seated Hip Flexion Toward Target  - 1 x daily - 4 x weekly - 2 sets - 5 reps - 5 seconds hold - Seated Eccentric Abdominal Lean Back  - 1 x daily - 4 x weekly - 2 sets - 5 reps - 5 seconds hold - Seated Sidebending  - 1 x daily - 4 x weekly - 2 sets - 5 reps - 5 seconds hold - Seated Trunk Rotation with Crossed Arms  - 1 x daily - 4 x weekly - 2 sets - 5 reps - 5 seconds hold   ASSESSMENT:  CLINICAL IMPRESSION: Patient had to miss some PT due to holidays with center closed and her dialysis schedule moved around. She took a little longer to use her legs for functional activities today.  Patient continues to make slow progress.    OBJECTIVE IMPAIRMENTS: Abnormal gait, decreased activity tolerance, decreased balance, decreased endurance, decreased knowledge of condition, decreased knowledge of use of DME, decreased mobility, difficulty walking, decreased ROM, decreased strength, decreased safety awareness, postural dysfunction, prosthetic dependency , and pain.   ACTIVITY LIMITATIONS: standing, transfers, and locomotion  level  PARTICIPATION LIMITATIONS: community activity, household mobility and dependency / burden of care on family  PERSONAL  FACTORS: Age, Fitness, Past/current experiences, Time since onset of injury/illness/exacerbation, and 3+ comorbidities: see PMH  are also affecting patient's functional outcome.   REHAB POTENTIAL: Good  CLINICAL DECISION MAKING: Evolving/moderate complexity  EVALUATION COMPLEXITY: Moderate   GOALS: Goals reviewed with patient? Yes  SHORT TERM GOALS: Target date: 04/22/2023:  Patient donnes prosthesis correctly with husband's assist & verbalizes proper cleaning. Baseline: SEE OBJECTIVE DATA Goal status:   MET 04/21/2023 2.  Patient tolerates prosthesis wear on non-dailysis days >8 hrs total /day and on dialysis days >2 hours/day without skin issues or limb pain >5/10. Baseline: SEE OBJECTIVE DATA Goal status:  partially MET 04/21/2023  3.  Patient able to stand with rolling walker with 1 person modA for 1 min.  Baseline: SEE OBJECTIVE DATA Goal status: MET 04/21/2023  4. Patient ambulates 5' with RW & prosthesis with +2 maxA.  Baseline: SEE OBJECTIVE DATA Goal status: MET 04/21/2023  5. Patient sit to/from stand transfer with +1 maxA and scooting transfer with modA. Baseline: SEE OBJECTIVE DATA Goal status: MET 04/21/2023  LONG TERM GOALS: Target date: 06/17/2022  Patient & husband demonstrates & verbalizes understanding of prosthetic care to enable safe utilization of prosthesis. Baseline: SEE OBJECTIVE DATA Goal status: Ongoing 05/26/2023  Patient tolerates prosthesis wear >80% of awake hours on non-dialysis days and >50% on dialysis days without skin issues and limb pain </= 4/10. Baseline: SEE OBJECTIVE DATA Goal status: Ongoing 05/26/2023  Scooting transfers with minA and sit to/from stand transfers with modA.  Baseline: SEE OBJECTIVE DATA Goal status: Ongoing 05/26/2023  Patient ambulates >10' with prosthesis & RW with modA.  Baseline: SEE OBJECTIVE DATA Goal status: Ongoing  05/26/2023  Patient stands with RW support with minA or less for 2 min.  Baseline: SEE  OBJECTIVE DATA Goal status:   Ongoing 05/26/2023   PLAN:  PT FREQUENCY: 2x/week  PT DURATION: 12 weeks  PLANNED INTERVENTIONS: 97164- PT Re-evaluation, 97110-Therapeutic exercises, 97530- Therapeutic activity, 97112- Neuromuscular re-education, [phone]- Self Care, [id number]- Gait training, [phone]- Prosthetic training, Patient/Family education, Balance training, DME instructions, Therapeutic exercises, Therapeutic activity, Neuromuscular re-education, Gait training, and Self Care  PLAN FOR NEXT SESSION    continue Nustep.  Leg press.  Standing with RW support. Stand pivot transfer with RW. Gait short distances with RW.    Exercises with limited use of RUE.  Restriction RUE lifting no greater than 10 pounds through 1/6.   Millissa Deese, PT, DPT 05/26/2023, 3:25 PM

## 2023-05-28 DIAGNOSIS — N186 End stage renal disease: Secondary | ICD-10-CM | POA: Diagnosis not present

## 2023-05-28 DIAGNOSIS — D689 Coagulation defect, unspecified: Secondary | ICD-10-CM | POA: Diagnosis not present

## 2023-05-28 DIAGNOSIS — N2581 Secondary hyperparathyroidism of renal origin: Secondary | ICD-10-CM | POA: Diagnosis not present

## 2023-05-28 DIAGNOSIS — D631 Anemia in chronic kidney disease: Secondary | ICD-10-CM | POA: Diagnosis not present

## 2023-05-28 DIAGNOSIS — Z992 Dependence on renal dialysis: Secondary | ICD-10-CM | POA: Diagnosis not present

## 2023-05-28 DIAGNOSIS — D509 Iron deficiency anemia, unspecified: Secondary | ICD-10-CM | POA: Diagnosis not present

## 2023-05-30 ENCOUNTER — Other Ambulatory Visit: Payer: Self-pay | Admitting: Family Medicine

## 2023-06-01 ENCOUNTER — Other Ambulatory Visit: Payer: Self-pay | Admitting: Family Medicine

## 2023-06-01 ENCOUNTER — Encounter: Payer: Self-pay | Admitting: Physical Therapy

## 2023-06-01 ENCOUNTER — Ambulatory Visit: Payer: PPO | Admitting: Physical Therapy

## 2023-06-01 DIAGNOSIS — R2689 Other abnormalities of gait and mobility: Secondary | ICD-10-CM | POA: Diagnosis not present

## 2023-06-01 DIAGNOSIS — R2681 Unsteadiness on feet: Secondary | ICD-10-CM

## 2023-06-01 DIAGNOSIS — M6281 Muscle weakness (generalized): Secondary | ICD-10-CM

## 2023-06-01 DIAGNOSIS — G546 Phantom limb syndrome with pain: Secondary | ICD-10-CM | POA: Diagnosis not present

## 2023-06-01 NOTE — Telephone Encounter (Signed)
 Requested medication (s) are due for refill today - yes  Requested medication (s) are on the active medication list -yes  Future visit scheduled -no  Last refill: 02/27/23 #30 2RF  Notes to clinic: non delegated Rx  Requested Prescriptions  Pending Prescriptions Disp Refills   LORazepam  (ATIVAN ) 0.5 MG tablet [Pharmacy Med Name: LORAZEPAM  0.5 MG TABLET] 30 tablet 2    Sig: TAKE 1 TABLET BY MOUTH AT BEDTIME.     Not Delegated - Psychiatry: Anxiolytics/Hypnotics 2 Failed - 06/01/2023  9:33 AM      Failed - This refill cannot be delegated      Failed - Urine Drug Screen completed in last 360 days      Failed - Valid encounter within last 6 months    Recent Outpatient Visits           1 year ago Cellulitis of lower extremity, unspecified laterality   [facility] Duanne Butler DASEN, MD   1 year ago Diarrhea, unspecified type   [facility] [name], Butler DASEN, MD   1 year ago Dysuria   [facility] Duanne Butler DASEN, MD   1 year ago Stage 4 chronic kidney disease (HCC)   [facility] Duanne Butler DASEN, MD   1 year ago Chronic diastolic CHF (congestive heart failure) (HCC)   [facility] [name], Butler DASEN, MD       Future Appointments             In 1 month Delford, Maude BROCKS, MD [facility]            Passed - Patient is not pregnant         Requested Prescriptions  Pending Prescriptions Disp Refills   LORazepam  (ATIVAN ) 0.5 MG tablet [Pharmacy Med Name: LORAZEPAM  0.5 MG TABLET] 30 tablet 2    Sig: TAKE 1 TABLET BY MOUTH AT BEDTIME.     Not Delegated - Psychiatry: Anxiolytics/Hypnotics 2 Failed - 06/01/2023  9:33 AM      Failed - This refill cannot be delegated      Failed - Urine Drug Screen completed in last 360 days      Failed - Valid encounter within last 6 months    Recent Outpatient Visits           1 year ago Cellulitis of lower  extremity, unspecified laterality   [facility] Duanne Butler DASEN, MD   1 year ago Diarrhea, unspecified type   [facility] Duanne Butler DASEN, MD   1 year ago Dysuria   [facility] Duanne Butler DASEN, MD   1 year ago Stage 4 chronic kidney disease (HCC)   [facility] Duanne Butler DASEN, MD   1 year ago Chronic diastolic CHF (congestive heart failure) (HCC)   [facility] [name], Butler DASEN, MD       Future Appointments             In 1 month Delford, Maude BROCKS, MD [facility]            Passed - Patient is not pregnant

## 2023-06-01 NOTE — Telephone Encounter (Signed)
 Copied from CRM [id number]. Topic: Clinical - Medical Advice >> May 29, 2023 12:35 PM Montie POUR wrote: Reason for CRM: Susan [name] was told by her dialysis group that she needs to see a podiatrist. She is wanting Dr. Duanne to refer her to a podiatrist. Please call at [phone]

## 2023-06-01 NOTE — Therapy (Signed)
 OUTPATIENT PHYSICAL THERAPY PROSTHETIC TREATMENT   Patient Name: Susan [name] MRN: [id number] DOB:[birth year], 73 y.o., female Today's Date: 06/01/2023  PCP: Duanne Butler DASEN, MD  REFERRING PROVIDER: Harden Jerona GAILS, MD  END OF SESSION:  PT End of Session - 06/01/23 1351     Visit Number 15    Number of Visits 25    Date for PT Re-Evaluation 06/18/23    Authorization Type [employer]    Progress Note Due on Visit 20    PT Start Time 1350    PT Stop Time 1429    PT Time Calculation (min) 39 min    Equipment Utilized During Treatment Gait belt    Activity Tolerance Patient tolerated treatment well;Patient limited by fatigue    Behavior During Therapy WFL for tasks assessed/performed                         Past Medical History:  Diagnosis Date   Anemia    hx   Anxiety    Arthritis    generalized (03/15/2014)   CAD (coronary artery disease)    MI in 2000 - MI  2007 - treated bare metal stent (no nuclear since then as 9/11)   Carotid artery disease (HCC)    Chronic diastolic heart failure (HCC)    a) ECHO (08/2013) EF 55-60% and RV function nl b) RHC (08/2013) RA 4, RV 30/5/7, PA 25/10 (16), PCWP 7, Fick CO/CI 6.3/2.7, PVR 1.5 WU, PA 61 and 66%   Daily headache    ~ every other day; since I fell in June (03/15/2014)   Depression    Diabetic retinopathy (HCC)    Dyslipidemia    ESRD (end stage renal disease) (HCC)    Dialysis on Tues Thurs Sat   Exertional shortness of breath    GERD (gastroesophageal reflux disease)    History of blood transfusion    History of kidney stones    HTN (hypertension)    Hypothyroidism    Myocardial infarction (HCC)    Obesity    Osteoarthritis    PAF (paroxysmal atrial fibrillation) (HCC)    Peripheral neuropathy    bilateral feet/hands   PONV (postoperative nausea and vomiting)    RBBB (right bundle branch block)    Old   Stroke (HCC)    mini strokes   Type II diabetes mellitus (HCC)    Type II,  Barnet libre left upper arm. patient has omnipod insulin  pump with Novolin R Insulin    Past Surgical History:  Procedure Laterality Date   A/V FISTULAGRAM Left 11/07/2022   Procedure: A/V Fistulagram;  Surgeon: Magda Debby SAILOR, MD;  [facility]: [facility];  Service: Cardiovascular;  Laterality: Left;   ABDOMINAL HYSTERECTOMY  1980's   AMPUTATION Right 02/24/2018   Procedure: RIGHT FOOT GREAT TOE AND 2ND TOE AMPUTATION;  Surgeon: Harden Jerona GAILS, MD;  Location: [facility] OR;  Service: Orthopedics;  Laterality: Right;   AMPUTATION Right 04/30/2018   Procedure: RIGHT TRANSMETATARSAL AMPUTATION;  Surgeon: Harden Jerona GAILS, MD;  Location: [facility] OR;  Service: Orthopedics;  Laterality: Right;   AMPUTATION Right 05/02/2022   Procedure: RIGHT BELOW KNEE AMPUTATION;  Surgeon: Harden Jerona GAILS, MD;  Location: [facility] OR;  Service: Orthopedics;  Laterality: Right;   APPLICATION OF WOUND VAC Right 06/13/2022   Procedure: APPLICATION OF WOUND VAC;  Surgeon: Harden Jerona GAILS, MD;  Location: [facility] OR;  Service: Orthopedics;  Laterality: Right;   APPLICATION OF WOUND  VAC Left 11/14/2022   Procedure: APPLICATION OF WOUND VAC;  Surgeon: Harden Jerona GAILS, MD;  Location: [facility] OR;  Service: Orthopedics;  Laterality: Left;   AV FISTULA PLACEMENT Left 04/02/2022   Procedure: LEFT ARM ARTERIOVENOUS (AV) FISTULA CREATION;  Surgeon: Serene Gaile ORN, MD;  [facility]: [facility] OR;  Service: Vascular;  Laterality: Left;  PERIPHERAL NERVE BLOCK   AV FISTULA PLACEMENT Right 04/27/2023   Procedure: RIGHT ARM BRACHIOBASILIC ARTERIOVENOUS (AV) FISTULA CREATION;  Surgeon: Magda Debby SAILOR, MD;  Location: [facility] OR;  Service: Vascular;  Laterality: Right;   BASCILIC VEIN TRANSPOSITION Left 07/31/2022   Procedure: LEFT ARM SECOND STAGE BASILIC VEIN TRANSPOSITION;  Surgeon: Serene Gaile ORN, MD;  [facility]: [facility] OR;  Service: Vascular;  Laterality: Left;   BIOPSY  05/27/2020   Procedure: BIOPSY;  Surgeon: Cindie Carlin POUR, DO;  [facility]: [facility];  Service: Endoscopy;;    CATARACT EXTRACTION, BILATERAL Bilateral ?2013   COLONOSCOPY W/ POLYPECTOMY     COLONOSCOPY WITH PROPOFOL  N/A 03/13/2019   Procedure: COLONOSCOPY WITH PROPOFOL ;  Surgeon: Albertus Gordy HERO, MD;  Location: [facility] ENDOSCOPY;  Service: Gastroenterology;  Laterality: N/A;   CORONARY ANGIOPLASTY WITH STENT PLACEMENT  1999; 2007   1 + 1   ERCP N/A 02/03/2022   Procedure: ENDOSCOPIC RETROGRADE CHOLANGIOPANCREATOGRAPHY (ERCP);  Surgeon: Rollin Dover, MD;  Location: [facility] ENDOSCOPY;  Service: Gastroenterology;  Laterality: N/A;   ESOPHAGOGASTRODUODENOSCOPY N/A 02/12/2023   Procedure: ESOPHAGOGASTRODUODENOSCOPY (EGD);  Surgeon: Rollin Dover, MD;  Location: [facility] ENDOSCOPY;  Service: Gastroenterology;  Laterality: N/A;   ESOPHAGOGASTRODUODENOSCOPY (EGD) WITH PROPOFOL  N/A 03/13/2019   Procedure: ESOPHAGOGASTRODUODENOSCOPY (EGD) WITH PROPOFOL ;  Surgeon: Albertus Gordy HERO, MD;  Location: [facility] ENDOSCOPY;  Service: Gastroenterology;  Laterality: N/A;   ESOPHAGOGASTRODUODENOSCOPY (EGD) WITH PROPOFOL  N/A 05/27/2020   Procedure: ESOPHAGOGASTRODUODENOSCOPY (EGD) WITH PROPOFOL ;  Surgeon: Cindie Carlin POUR, DO;  [facility];  Service: Endoscopy;  Laterality: N/A;   ESOPHAGOGASTRODUODENOSCOPY (EGD) WITH PROPOFOL  N/A 09/03/2022   Procedure: ESOPHAGOGASTRODUODENOSCOPY (EGD) WITH PROPOFOL ;  Surgeon: Rollin Dover, MD;  Location: [facility] ENDOSCOPY;  Service: Gastroenterology;  Laterality: N/A;   EYE SURGERY Bilateral    lazer   FLEXIBLE SIGMOIDOSCOPY N/A 05/23/2022   Procedure: FLEXIBLE SIGMOIDOSCOPY;  Surgeon: Rollin Dover, MD;  Location: [facility] ENDOSCOPY;  Service: Gastroenterology;  Laterality: N/A;   FLEXIBLE SIGMOIDOSCOPY N/A 05/24/2022   Procedure: FLEXIBLE SIGMOIDOSCOPY;  Surgeon: Federico Rosario BROCKS, MD;  Location: [facility] ENDOSCOPY;  Service: Gastroenterology;  Laterality: N/A;   FLEXIBLE SIGMOIDOSCOPY N/A 09/03/2022   Procedure: FLEXIBLE SIGMOIDOSCOPY;  Surgeon: Rollin Dover, MD;  Location: [facility] ENDOSCOPY;  Service: Gastroenterology;   Laterality: N/A;   HEMOSTASIS CLIP PLACEMENT  03/13/2019   Procedure: HEMOSTASIS CLIP PLACEMENT;  Surgeon: Albertus Gordy HERO, MD;  Location: [facility] ENDOSCOPY;  Service: Gastroenterology;;   HEMOSTASIS CLIP PLACEMENT  05/23/2022   Procedure: HEMOSTASIS CLIP PLACEMENT;  Surgeon: Rollin Dover, MD;  Location: [facility] ENDOSCOPY;  Service: Gastroenterology;;   HEMOSTASIS CONTROL  05/24/2022   Procedure: HEMOSTASIS CONTROL;  Surgeon: Federico Rosario BROCKS, MD;  Location: [facility] ENDOSCOPY;  Service: Gastroenterology;;   HOT HEMOSTASIS N/A 05/23/2022   Procedure: HOT HEMOSTASIS (ARGON PLASMA COAGULATION/BICAP);  Surgeon: Rollin Dover, MD;  Location: [facility] ENDOSCOPY;  Service: Gastroenterology;  Laterality: N/A;   I & D EXTREMITY Left 05/05/2022   Procedure: IRRIGATION AND DEBRIDEMENT LEFT ARM AV FISTULA;  Surgeon: Gretta Lonni PARAS, MD;  Location[facility] OR;  Service: Vascular;  Laterality: Left;   I & D EXTREMITY N/A 11/14/2022   Procedure: IRRIGATION AND DEBRIDEMENT OF LOWER EXTREMITY WOUND;  Surgeon: Harden Jerona GAILS, MD;  Location: [facility] OR;  Service: Orthopedics;  Laterality: N/A;   INSERTION OF DIALYSIS CATHETER Right 04/02/2022   Procedure: INSERTION OF TUNNELED DIALYSIS CATHETER;  Surgeon: Serene Gaile ORN, MD;  Location: [facility] OR;  Service: Vascular;  Laterality: Right;   KNEE ARTHROSCOPY Left 10/25/2006   POLYPECTOMY  03/13/2019   Procedure: POLYPECTOMY;  Surgeon: Albertus Gordy HERO, MD;  Location[facility] ENDOSCOPY;  Service: Gastroenterology;;   REMOVAL OF STONES  02/03/2022   Procedure: REMOVAL OF STONES;  Surgeon: Rollin Dover, MD;  Location: [facility] ENDOSCOPY;  Service: Gastroenterology;;   REVISON OF ARTERIOVENOUS FISTULA Left 08/20/2022   Procedure: REVISON OF LEFT ARM ARTERIOVENOUS FISTULA;  Surgeon: Serene Gaile ORN, MD;  Location[facility] OR;  Service: Vascular;  Laterality: Left;   RIGHT HEART CATH N/A 07/24/2017   Procedure: RIGHT HEART CATH;  Surgeon: Cherrie Toribio SAUNDERS, MD;  [facility];  Service: Cardiovascular;   Laterality: N/A;   RIGHT HEART CATHETERIZATION N/A 09/22/2013   Procedure: RIGHT HEART CATH;  Surgeon: Toribio SAUNDERS Cherrie, MD;  Location: [facility] CATH LAB;  Service: Cardiovascular;  Laterality: N/A;   SHOULDER ARTHROSCOPY WITH OPEN ROTATOR CUFF REPAIR Right 03/14/2014   Procedure: RIGHT SHOULDER ARTHROSCOPY WITH BICEPS RELEASE, OPEN SUBSCAPULA REPAIR, OPEN SUPRASPINATUS REPAIR.;  Surgeon: Cordella Glendia Hutchinson, MD;  Location: [facility] OR;  Service: Orthopedics;  Laterality: Right;   SPHINCTEROTOMY  02/03/2022   Procedure: SPHINCTEROTOMY;  Surgeon: Rollin Dover, MD;  Location: [facility] ENDOSCOPY;  Service: Gastroenterology;;   STUMP REVISION Right 06/13/2022   Procedure: REVISION RIGHT BELOW KNEE AMPUTATION;  Surgeon: Harden Jerona GAILS, MD;  Location: [facility] OR;  Service: Orthopedics;  Laterality: Right;   TEE WITHOUT CARDIOVERSION N/A 02/04/2022   Procedure: TRANSESOPHAGEAL ECHOCARDIOGRAM (TEE);  Surgeon: Cherrie Toribio SAUNDERS, MD;  Location: [name] ENDOSCOPY;  Service: Cardiovascular;  Laterality: N/A;   THROMBECTOMY W/ EMBOLECTOMY Left 08/20/2022   Procedure: THROMBECTOMY OF LEFT ARM ARTERIOVENOUS FISTULA;  Surgeon: Serene Gaile ORN, MD;  Location: [facility] OR;  Service: Vascular;  Laterality: Left;   TOE AMPUTATION Right 02/24/2018   GREAT TOE AND 2ND TOE AMPUTATION   TUBAL LIGATION  1970's   Patient Active Problem List   Diagnosis Date Noted   Hyperosmolar hyperglycemic state (HHS) (HCC) 02/05/2023   Depression 01/29/2023   Nausea 01/24/2023   Intractable nausea and vomiting 01/20/2023   Proctitis 01/15/2023   Acute on chronic anemia 11/25/2022   Right below-knee amputee (HCC) 11/20/2022   Cellulitis of left lower extremity 11/14/2022   Maggot infestation 11/12/2022   Complicated wound infection 11/10/2022   Secondary hypercoagulable state (HCC) 09/04/2022   Right sided abdominal pain 08/31/2022   Constipation 06/07/2022   History of Clostridioides difficile colitis 06/06/2022   Below-knee amputation of right lower  extremity (HCC) 06/06/2022   Diverticulitis 06/05/2022   Stercoral colitis 06/05/2022   C. 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) 04/07/2022   Allergy, unspecified, initial encounter 04/07/2022   Dependence on renal dialysis (HCC)  04/07/2022   Gout due to renal impairment, unspecified site 04/07/2022   Hypertensive heart and chronic kidney disease with heart failure and with stage 5 chronic kidney disease, or end stage renal disease (HCC) 04/07/2022   Personal history of transient ischemic attack (TIA), and cerebral infarction without residual deficits 04/07/2022   Renal osteodystrophy 04/07/2022   Venous stasis ulcer of right calf (HCC) 03/31/2022   Fistula, colovaginal 03/26/2022   Diarrhea 03/26/2022   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   Sepsis without acute organ dysfunction (HCC)    Bacteremia    Acute pancreatitis 02/01/2022   Abdominal pain 02/01/2022   SIRS (systemic inflammatory response syndrome) (HCC) 02/01/2022   Transaminitis 02/01/2022   History of anemia due to chronic kidney disease 02/01/2022   Paroxysmal atrial fibrillation (HCC) 02/01/2022   Uncontrolled type 2 diabetes mellitus with hyperglycemia, with long-term current use of insulin  (HCC) 01/14/2022   NSTEMI (non-ST elevated myocardial infarction) (HCC) 03/05/2021   Acute renal failure superimposed on stage 4 chronic kidney disease (HCC) 08/22/2020    Hypoalbuminemia 05/25/2020   GERD (gastroesophageal reflux disease) 05/25/2020   Pressure injury of skin 05/17/2020   Acute on chronic combined systolic and diastolic congestive heart failure (HCC) 03/07/2020   Type 2 diabetes mellitus with diabetic polyneuropathy, with long-term current use of insulin  (HCC) 03/07/2020   Obesity, Class III, BMI 40-49.9 (morbid obesity) (HCC) 03/07/2020   Common bile duct (CBD) obstruction 05/28/2019   Benign neoplasm of ascending colon    Benign neoplasm of transverse colon    Benign neoplasm of descending colon    Benign neoplasm of sigmoid colon    Gastric polyps    Hyperkalemia 03/11/2019   Prolonged QT interval 03/11/2019   Acute blood loss anemia 03/11/2019   Onychomycosis 06/21/2018   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)    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 (HCC)    PVD (peripheral vascular disease) (HCC) 10/26/2017   E-coli UTI 07/27/2017   Hypothyroidism 07/27/2017   AKI (acute kidney injury) (HCC)    PAH (pulmonary artery hypertension) (HCC)    Impaired ambulation 07/19/2017   Nausea & vomiting 07/15/2017   Leg cramps 02/27/2017   Peripheral edema 01/12/2017   Diabetic neuropathy (HCC) 11/12/2016   CKD (chronic kidney disease), stage IV (HCC) 10/24/2015   Anemia of chronic disease 10/03/2015   Historical diagnosis of generalized anxiety disorder 10/03/2015   Secondary insomnia 10/03/2015   Hyperglycemia due to diabetes mellitus (HCC) 06/07/2015   Non compliance with medical treatment 04/17/2014   Rotator cuff tear 03/14/2014   Obesity 09/23/2013   Chronic HFrEF (heart failure with reduced ejection fraction) (HCC) 06/03/2013   Hypotension 12/25/2012   Hyponatremia 12/25/2012   Urinary incontinence    MDD (major depressive disorder), recurrent episode, moderate (HCC) 11/12/2010   RBBB (right bundle branch block)    Wide-complex tachycardia    Coronary artery disease    Hyperlipemia 01/22/2009    Chronic hypotension 01/22/2009    ONSET DATE: 03/09/2023 MD referral to PT  REFERRING DIAG: S88.111D (ICD-10-CM) - Below-knee amputation of right lower extremity, subsequent encounter   THERAPY DIAG:  Unsteadiness on feet  Muscle weakness (generalized)  Phantom limb syndrome with pain (HCC)  Other abnormalities of gait and mobility  Rationale for Evaluation and Treatment: Rehabilitation  SUBJECTIVE:   SUBJECTIVE STATEMENT: She did one stand pivot transfer with RW with her husband.  They had trouble with her falling backwards.   PERTINENT HISTORY: Right TTA 05/02/22, Lymphedema, idiopathic  chronic venous HTN with ulcer, depression, colitis, diverticulitis, ESRD, gout, NSTEMI, DM2, polyneuropathy, PVD, CAD, right bundle branch block, mini strokes  PAIN:  Are you having pain? Phantom pain with no known aggravation.   PRECAUTIONS: Fall and Other: No BP LUE  WEIGHT BEARING RESTRICTIONS: No  FALLS: Has patient fallen in last 6 months? No  LIVING ENVIRONMENT: Lives with: lives with their spouse and 2 small dogs Lives in: mobile home Home Access: Ramped entrance Home layout: One level Stairs: Yes: External: 6-7 steps; can reach both Has following equipment at home: Single point cane, Environmental Consultant - 2 wheeled, Environmental Consultant - 4 wheeled, Wheelchair (manual), Graybar electric, Grab bars, and Ramped entry  OCCUPATION:  retired  PLOF: prior to amputation for ~year used RW in home & community  PATIENT GOALS:  to use prosthesis to walk, get out w/c in & out of car, get to bathroom.   OBJECTIVE:  COGNITION: Overall cognitive status:  Eval on 03/23/2023:   Within functional limits for tasks assessed  POSTURE:  Eval on 03/23/2023:  rounded shoulders, forward head, increased thoracic kyphosis, flexed trunk , and weight shift left  LOWER EXTREMITY ROM: ROM Right eval Left eval  Hip flexion    Hip extension    Hip abduction    Hip adduction    Hip internal rotation    Hip external rotation     Knee flexion    Knee extension    Ankle dorsiflexion    Ankle plantarflexion    Ankle inversion    Ankle eversion     (Blank rows = not tested)  LOWER EXTREMITY MMT:  MMT Right eval Left eval  Hip flexion 3-/5 3-/5  Hip extension 2/5 2/5  Hip abduction 2+/5 2+/5  Hip adduction    Hip internal rotation    Hip external rotation    Knee flexion 3-/5 3-/5  Knee extension 3-/5 3-/5  Ankle dorsiflexion    Ankle plantarflexion    Ankle inversion    Ankle eversion    At Evaluation all strength testing is grossly seated and functionally standing / gait. (Blank rows = not tested)  TRANSFERS: 05/04/2023: Squat pivot transfers with modA.  04/21/2023: Pt sit to stand w/c to //bar with one hand pushing on w/c and other hand on //bar with modA first time & MinA 2nd/ 3rd time.  Best is RUE on //bar & LUE on w/c.  Stand to sit reaching LUE to w/c with minA to control descent.   PT demo & verbal cues on sit to/from stand w/c to RW.  1st time modA 2 person. 2nd time maxA one person.  Stand to sit with minA and constant cueing. Scooting transfers with minA with w/c & mat direct contact & same ht.   Eval on 03/23/2023:  Sit to stand: Max A (two-person assist) from 22 w/c to rolling walker Stand to sit: Max A (two-person assist) rolling walker to wheelchair  FUNCTIONAL TESTs:   04/21/2023: Pt able to stand 60 sec with BUE on //bars with supervision.  Pt able to stand for 1 min with RW support with minA.  Eval on 03/23/2023: Patient maintained upright holding rolling walker with mod assist 2 people for safety for 30 seconds.  GAIT: 04/21/2023: Pt amb 5' in //bars with modA (2 person for safety) with PT verbal & tactile cues on sequence and weight shift.  Pt amb 10' with RW with maxA (2 person for safety) with PT verbal & tactile cues on sequence and weight shift.  Eval on 03/23/2023:  Patient took 2 steps (one per LE) with +2 max assist with rolling walker and TTA prosthesis.  Patient  adducting prosthesis stepping on left foot with no awareness.  CURRENT PROSTHETIC WEAR ASSESSMENT:  Eval on 03/23/2023:   Patient is dependent with: skin check, residual limb care, prosthetic cleaning, ply sock cleaning, correct ply sock adjustment, proper wear schedule/adjustment, and proper weight-bearing schedule/adjustment Donning prosthesis: Max A Doffing prosthesis: Min A Prosthetic wear tolerance: 2-6 hours, 1x/day, 3-4 days/week Prosthetic weight bearing tolerance: 3 minutes with limb pain Edema: pitting edema Residual limb condition: No open areas, dry skin, normal color and temperature, cylindrical shape Prosthetic description: Silicone liner with pin lock suspension, total contact socket with flexible inner socket, sach foot    TODAY'S TREATMENT:                                                                                                                             DATE:  06/01/2023: Therapeutic Activities: Stand pivot transfer with RW  w/c to Nustep going to her left with modA (2nd person for safety) & Nustep to w/c going to her right with modA.  RUE contact assist on RW. PT demo & verbal cues on technique & set-up Squat pivot transfer leg press to w/c with minA.  Therapeutic Exercise: Nustep seat 9 level 5 with LUE & BLEs for 5 min, then Level 3 with BLEs only for 3 min. Leg press BLEs 62# 10 reps 2 sets;  marching to simulate SLS in midstance with knee buckling - 37# BLEs concentric & eccentric and isometric hold while lifting contralateral off plate -  5 reps LLE lead and 5 reps RLE lead;   Prosthetic Training: Pt amb 5' with RW with maxA +2 person assist.  PT verbal cues on sequence.    TREATMENT:                                                                                                                             DATE:  05/26/2023: Therapeutic Activities: Stand pivot transfer with RW  w/c to Nustep going to her left with modA (2nd person for safety) & Nustep to  w/c going to her right with modA.  RUE contact assist on RW. PT demo & verbal cues on technique & set-up Squat pivot transfer leg press to w/c with minA.  Therapeutic Exercise: Nustep seat 9 level 5 with LUE &  BLEs for 4 min, then Level 3 with BLEs only for 4 min. Leg press BLEs 56# 10 reps 2 sets;  marching to simulate SLS in midstance with knee buckling - 25# BLEs concentric & eccentric and isometric hold while lifting contralateral off plate -  5 reps LLE lead and 5 reps RLE lead;  single leg 25# 5 reps 2 sets ea LE.  Prosthetic Training: Pt amb 5' with RW with maxA +2 person assist.  PT verbal cues on sequence. PT recommended appt with prosthetist to have pads added to socket and do an alignment check.  Once pads are added, then she will need less socks depending on size & thickness of pads.  Pt & husband verbalized understanding.    TREATMENT:                                                                                                                             DATE:  05/13/2023: Therapeutic Activities: Stand pivot transfer with RW  w/c to Nustep going to her left with modA (2 person for safety) & Nustep to w/c going to her right with modA.  RUE contact assist onRW. PT demo & verbal cues on technique & set-up W/c to leg press (2 higher) squat pivot transfer with modA and leg press to w/c with minA.   Therapeutic Exercise: Nustep seat 9 level 5 with LUE & BLEs for 4 min, then Level 3 with BLEs only for 4 min. Leg press BLEs 56# 10 reps 2 sets;  marching to simulate SLS in midstance with knee buckling - 25# BLEs concentric & eccentric and isometric hold while lifting contralateral off plate -  5 reps LLE lead and 5 reps RLE lead;  single leg 18# 5 reps 2 sets ea LE.  Prosthetic Training: No changes to residual limb.  Socket rotates on limb with activities.  Pt is wearing >10 ply fit.  PT recommended setting up appointment with prosthetist after the holidays to have pads added to sock.  Pt & husband verbalized understanding.    PATIENT EDUCATION: PATIENT EDUCATED ON FOLLOWING PROSTHETIC CARE: Education details: Use of stockinette under proximal liner to decrease itching sensation, no wounds presents to PT recommended not using Vive wear under liner Skin check, Prosthetic cleaning, Propper donning, and Proper wear schedule/adjustment Prosthetic wear tolerance: 3 hours 2x/day, 4 days/week Person educated: Patient and Spouse Education method: Explanation, Tactile cues, and Verbal cues Education comprehension: verbalized understanding, verbal cues required, tactile cues required, and needs further education  HOME EXERCISE PROGRAM: Access Code: [email or web address] URL: [email or web address] Date: 04/15/2023 Prepared by: Grayce Spatz  Exercises - Supine Bridge  - 1 x daily - 4 x weekly - 2 sets - 5 reps - 2 seconds hold - Supine Lower Trunk Rotation  - 1 x daily - 4 x weekly - 2 sets - 5 reps - 5 seconds hold - Supine Heel Slide with Strap  - 1 x daily - 4 x weekly -  2 sets - 5 reps - 2-3 seconds hold - Supine Hip Abduction  - 1 x daily - 4 x weekly - 2 sets - 5 reps - 2-3 seconds hold - Seated Hip Flexion Toward Target  - 1 x daily - 4 x weekly - 2 sets - 5 reps - 5 seconds hold - Seated Eccentric Abdominal Lean Back  - 1 x daily - 4 x weekly - 2 sets - 5 reps - 5 seconds hold - Seated Sidebending  - 1 x daily - 4 x weekly - 2 sets - 5 reps - 5 seconds hold - Seated Trunk Rotation with Crossed Arms  - 1 x daily - 4 x weekly - 2 sets - 5 reps - 5 seconds hold   ASSESSMENT:  CLINICAL IMPRESSION: Patient needed review of technique for sit to stand and stand pivot transfers.  Pt & husband appear to have better understanding but need more repetition in PT for strength and full comprehension.  Pt tolerated slight increase in weight for leg press today.   Patient continues to make slow progress.    OBJECTIVE IMPAIRMENTS: Abnormal gait, decreased activity tolerance, decreased  balance, decreased endurance, decreased knowledge of condition, decreased knowledge of use of DME, decreased mobility, difficulty walking, decreased ROM, decreased strength, decreased safety awareness, postural dysfunction, prosthetic dependency , and pain.   ACTIVITY LIMITATIONS: standing, transfers, and locomotion level  PARTICIPATION LIMITATIONS: community activity, household mobility and dependency / burden of care on family  PERSONAL FACTORS: Age, Fitness, Past/current experiences, Time since onset of injury/illness/exacerbation, and 3+ comorbidities: see PMH  are also affecting patient's functional outcome.   REHAB POTENTIAL: Good  CLINICAL DECISION MAKING: Evolving/moderate complexity  EVALUATION COMPLEXITY: Moderate   GOALS: Goals reviewed with patient? Yes  SHORT TERM GOALS: Target date: 04/22/2023:  Patient donnes prosthesis correctly with husband's assist & verbalizes proper cleaning. Baseline: SEE OBJECTIVE DATA Goal status:   MET 04/21/2023 2.  Patient tolerates prosthesis wear on non-dailysis days >8 hrs total /day and on dialysis days >2 hours/day without skin issues or limb pain >5/10. Baseline: SEE OBJECTIVE DATA Goal status:  partially MET 04/21/2023  3.  Patient able to stand with rolling walker with 1 person modA for 1 min.  Baseline: SEE OBJECTIVE DATA Goal status: MET 04/21/2023  4. Patient ambulates 5' with RW & prosthesis with +2 maxA.  Baseline: SEE OBJECTIVE DATA Goal status: MET 04/21/2023  5. Patient sit to/from stand transfer with +1 maxA and scooting transfer with modA. Baseline: SEE OBJECTIVE DATA Goal status: MET 04/21/2023  LONG TERM GOALS: Target date: 06/17/2022  Patient & husband demonstrates & verbalizes understanding of prosthetic care to enable safe utilization of prosthesis. Baseline: SEE OBJECTIVE DATA Goal status: Ongoing 06/01/2023  Patient tolerates prosthesis wear >80% of awake hours on non-dialysis days and >50% on dialysis days  without skin issues and limb pain </= 4/10. Baseline: SEE OBJECTIVE DATA Goal status: Ongoing 06/01/2023  Scooting transfers with minA and sit to/from stand transfers with modA.  Baseline: SEE OBJECTIVE DATA Goal status: Ongoing 06/01/2023  Patient ambulates >10' with prosthesis & RW with modA.  Baseline: SEE OBJECTIVE DATA Goal status: Ongoing  06/01/2023  Patient stands with RW support with minA or less for 2 min.  Baseline: SEE OBJECTIVE DATA Goal status:   Ongoing 06/01/2023   PLAN:  PT FREQUENCY: 2x/week  PT DURATION: 12 weeks  PLANNED INTERVENTIONS: 97164- PT Re-evaluation, 97110-Therapeutic exercises, 97530- Therapeutic activity, 97112- Neuromuscular re-education, 97535- Self Care, [id number]- Gait training,  02238- Prosthetic training, Patient/Family education, Balance training, DME instructions, Therapeutic exercises, Therapeutic activity, Neuromuscular re-education, Gait training, and Self Care  PLAN FOR NEXT SESSION   continue Nustep.  Leg press.  Standing with RW support. Stand pivot transfer with RW. Gait short distances with RW.    Exercises with limited use of RUE.  Restriction RUE lifting no greater than 10 pounds through 1/6.   Grayce Spatz, PT, DPT 06/01/2023, 3:32 PM

## 2023-06-02 ENCOUNTER — Other Ambulatory Visit: Payer: Self-pay

## 2023-06-02 DIAGNOSIS — D689 Coagulation defect, unspecified: Secondary | ICD-10-CM | POA: Diagnosis not present

## 2023-06-02 DIAGNOSIS — N184 Chronic kidney disease, stage 4 (severe): Secondary | ICD-10-CM

## 2023-06-02 DIAGNOSIS — E1165 Type 2 diabetes mellitus with hyperglycemia: Secondary | ICD-10-CM

## 2023-06-02 DIAGNOSIS — N186 End stage renal disease: Secondary | ICD-10-CM | POA: Diagnosis not present

## 2023-06-02 DIAGNOSIS — N2581 Secondary hyperparathyroidism of renal origin: Secondary | ICD-10-CM | POA: Diagnosis not present

## 2023-06-02 DIAGNOSIS — D509 Iron deficiency anemia, unspecified: Secondary | ICD-10-CM | POA: Diagnosis not present

## 2023-06-02 DIAGNOSIS — Z992 Dependence on renal dialysis: Secondary | ICD-10-CM | POA: Diagnosis not present

## 2023-06-02 DIAGNOSIS — I739 Peripheral vascular disease, unspecified: Secondary | ICD-10-CM

## 2023-06-03 ENCOUNTER — Ambulatory Visit: Payer: PPO | Admitting: Physical Therapy

## 2023-06-03 ENCOUNTER — Encounter: Payer: Self-pay | Admitting: Physical Therapy

## 2023-06-03 DIAGNOSIS — Z7409 Other reduced mobility: Secondary | ICD-10-CM | POA: Diagnosis not present

## 2023-06-03 DIAGNOSIS — R2681 Unsteadiness on feet: Secondary | ICD-10-CM | POA: Diagnosis not present

## 2023-06-03 DIAGNOSIS — M6281 Muscle weakness (generalized): Secondary | ICD-10-CM | POA: Diagnosis not present

## 2023-06-03 DIAGNOSIS — R2689 Other abnormalities of gait and mobility: Secondary | ICD-10-CM | POA: Diagnosis not present

## 2023-06-03 DIAGNOSIS — G546 Phantom limb syndrome with pain: Secondary | ICD-10-CM | POA: Diagnosis not present

## 2023-06-03 NOTE — Therapy (Signed)
 OUTPATIENT PHYSICAL THERAPY PROSTHETIC TREATMENT   Patient Name: Susan [name] MRN: [id number] DOB:April 12, 1951, 73 y.o., female Today's Date: 06/03/2023  PCP: Duanne Butler DASEN, MD  REFERRING PROVIDER: Harden Jerona GAILS, MD  END OF SESSION:  PT End of Session - 06/03/23 1359     Visit Number 16    Number of Visits 25    Date for PT Re-Evaluation 06/18/23    Authorization Type [employer]    Progress Note Due on Visit 20    PT Start Time 1346    PT Stop Time 1433    PT Time Calculation (min) 47 min    Equipment Utilized During Treatment Gait belt    Activity Tolerance Patient tolerated treatment well;Patient limited by fatigue    Behavior During Therapy WFL for tasks assessed/performed                          Past Medical History:  Diagnosis Date   Anemia    hx   Anxiety    Arthritis    generalized (03/15/2014)   CAD (coronary artery disease)    MI in 2000 - MI  2007 - treated bare metal stent (no nuclear since then as 9/11)   Carotid artery disease (HCC)    Chronic diastolic heart failure (HCC)    a) ECHO (08/2013) EF 55-60% and RV function nl b) RHC (08/2013) RA 4, RV 30/5/7, PA 25/10 (16), PCWP 7, Fick CO/CI 6.3/2.7, PVR 1.5 WU, PA 61 and 66%   Daily headache    ~ every other day; since I fell in June (03/15/2014)   Depression    Diabetic retinopathy (HCC)    Dyslipidemia    ESRD (end stage renal disease) (HCC)    Dialysis on Tues Thurs Sat   Exertional shortness of breath    GERD (gastroesophageal reflux disease)    History of blood transfusion    History of kidney stones    HTN (hypertension)    Hypothyroidism    Myocardial infarction (HCC)    Obesity    Osteoarthritis    PAF (paroxysmal atrial fibrillation) (HCC)    Peripheral neuropathy    bilateral feet/hands   PONV (postoperative nausea and vomiting)    RBBB (right bundle branch block)    Old   Stroke (HCC)    mini strokes   Type II diabetes mellitus (HCC)    Type II,  Barnet libre left upper arm. patient has omnipod insulin  pump with Novolin R Insulin    Past Surgical History:  Procedure Laterality Date   A/V FISTULAGRAM Left 11/07/2022   Procedure: A/V Fistulagram;  Surgeon: Magda Debby SAILOR, MD;  [facility]: [facility];  Service: Cardiovascular;  Laterality: Left;   ABDOMINAL HYSTERECTOMY  1980's   AMPUTATION Right 02/24/2018   Procedure: RIGHT FOOT GREAT TOE AND 2ND TOE AMPUTATION;  Surgeon: Harden Jerona GAILS, MD;  Location: [facility] OR;  Service: Orthopedics;  Laterality: Right;   AMPUTATION Right 04/30/2018   Procedure: RIGHT TRANSMETATARSAL AMPUTATION;  Surgeon: Harden Jerona GAILS, MD;  Location: [facility] OR;  Service: Orthopedics;  Laterality: Right;   AMPUTATION Right 05/02/2022   Procedure: RIGHT BELOW KNEE AMPUTATION;  Surgeon: Harden Jerona GAILS, MD;  Location: [facility] OR;  Service: Orthopedics;  Laterality: Right;   APPLICATION OF WOUND VAC Right 06/13/2022   Procedure: APPLICATION OF WOUND VAC;  Surgeon: Harden Jerona GAILS, MD;  Location: [facility] OR;  Service: Orthopedics;  Laterality: Right;   APPLICATION OF  WOUND VAC Left 11/14/2022   Procedure: APPLICATION OF WOUND VAC;  Surgeon: Harden Jerona GAILS, MD;  Location: [facility] OR;  Service: Orthopedics;  Laterality: Left;   AV FISTULA PLACEMENT Left 04/02/2022   Procedure: LEFT ARM ARTERIOVENOUS (AV) FISTULA CREATION;  Surgeon: Serene Gaile ORN, MD;  Location: [facility] OR;  Service: Vascular;  Laterality: Left;  PERIPHERAL NERVE BLOCK   AV FISTULA PLACEMENT Right 04/27/2023   Procedure: RIGHT ARM BRACHIOBASILIC ARTERIOVENOUS (AV) FISTULA CREATION;  Surgeon: Magda Debby SAILOR, MD;  Location: [facility] OR;  Service: Vascular;  Laterality: Right;   BASCILIC VEIN TRANSPOSITION Left 07/31/2022   Procedure: LEFT ARM SECOND STAGE BASILIC VEIN TRANSPOSITION;  Surgeon: Serene Gaile ORN, MD;  Location: [facility] OR;  Service: Vascular;  Laterality: Left;   BIOPSY  05/27/2020   Procedure: BIOPSY;  Surgeon: Cindie Carlin POUR, DO;  Location: [facility];  Service: Endoscopy;;    CATARACT EXTRACTION, BILATERAL Bilateral ?2013   COLONOSCOPY W/ POLYPECTOMY     COLONOSCOPY WITH PROPOFOL  N/A 03/13/2019   Procedure: COLONOSCOPY WITH PROPOFOL ;  Surgeon: Albertus Gordy HERO, MD;  Location: [facility] ENDOSCOPY;  Service: Gastroenterology;  Laterality: N/A;   CORONARY ANGIOPLASTY WITH STENT PLACEMENT  1999; 2007   1 + 1   ERCP N/A 02/03/2022   Procedure: ENDOSCOPIC RETROGRADE CHOLANGIOPANCREATOGRAPHY (ERCP);  Surgeon: Rollin Dover, MD;  Location: [facility] ENDOSCOPY;  Service: Gastroenterology;  Laterality: N/A;   ESOPHAGOGASTRODUODENOSCOPY N/A 02/12/2023   Procedure: ESOPHAGOGASTRODUODENOSCOPY (EGD);  Surgeon: Rollin Dover, MD;  Location: [facility] ENDOSCOPY;  Service: Gastroenterology;  Laterality: N/A;   ESOPHAGOGASTRODUODENOSCOPY (EGD) WITH PROPOFOL  N/A 03/13/2019   Procedure: ESOPHAGOGASTRODUODENOSCOPY (EGD) WITH PROPOFOL ;  Surgeon: Albertus Gordy HERO, MD;  Location: [facility] ENDOSCOPY;  Service: Gastroenterology;  Laterality: N/A;   ESOPHAGOGASTRODUODENOSCOPY (EGD) WITH PROPOFOL  N/A 05/27/2020   Procedure: ESOPHAGOGASTRODUODENOSCOPY (EGD) WITH PROPOFOL ;  Surgeon: Cindie Carlin POUR, DO;  [facility];  Service: Endoscopy;  Laterality: N/A;   ESOPHAGOGASTRODUODENOSCOPY (EGD) WITH PROPOFOL  N/A 09/03/2022   Procedure: ESOPHAGOGASTRODUODENOSCOPY (EGD) WITH PROPOFOL ;  Surgeon: Rollin Dover, MD;  Location: [facility] ENDOSCOPY;  Service: Gastroenterology;  Laterality: N/A;   EYE SURGERY Bilateral    lazer   FLEXIBLE SIGMOIDOSCOPY N/A 05/23/2022   Procedure: FLEXIBLE SIGMOIDOSCOPY;  Surgeon: Rollin Dover, MD;  Location: [facility] ENDOSCOPY;  Service: Gastroenterology;  Laterality: N/A;   FLEXIBLE SIGMOIDOSCOPY N/A 05/24/2022   Procedure: FLEXIBLE SIGMOIDOSCOPY;  Surgeon: Federico Rosario BROCKS, MD;  Location: [facility] ENDOSCOPY;  Service: Gastroenterology;  Laterality: N/A;   FLEXIBLE SIGMOIDOSCOPY N/A 09/03/2022   Procedure: FLEXIBLE SIGMOIDOSCOPY;  Surgeon: Rollin Dover, MD;  Location: [facility] ENDOSCOPY;  Service: Gastroenterology;   Laterality: N/A;   HEMOSTASIS CLIP PLACEMENT  03/13/2019   Procedure: HEMOSTASIS CLIP PLACEMENT;  Surgeon: Albertus Gordy HERO, MD;  Location: [facility] ENDOSCOPY;  Service: Gastroenterology;;   HEMOSTASIS CLIP PLACEMENT  05/23/2022   Procedure: HEMOSTASIS CLIP PLACEMENT;  Surgeon: Rollin Dover, MD;  Location: [facility] ENDOSCOPY;  Service: Gastroenterology;;   HEMOSTASIS CONTROL  05/24/2022   Procedure: HEMOSTASIS CONTROL;  Surgeon: Federico Rosario BROCKS, MD;  Location: [facility] ENDOSCOPY;  Service: Gastroenterology;;   HOT HEMOSTASIS N/A 05/23/2022   Procedure: HOT HEMOSTASIS (ARGON PLASMA COAGULATION/BICAP);  Surgeon: Rollin Dover, MD;  Location: [facility] ENDOSCOPY;  Service: Gastroenterology;  Laterality: N/A;   I & D EXTREMITY Left 05/05/2022   Procedure: IRRIGATION AND DEBRIDEMENT LEFT ARM AV FISTULA;  Surgeon: Gretta Lonni PARAS, MD;  Location: [facility] OR;  Service: Vascular;  Laterality: Left;   I & D EXTREMITY N/A 11/14/2022   Procedure: IRRIGATION AND DEBRIDEMENT OF LOWER EXTREMITY  WOUND;  Surgeon: Harden Jerona GAILS, MD;  Location: [facility] OR;  Service: Orthopedics;  Laterality: N/A;   INSERTION OF DIALYSIS CATHETER Right 04/02/2022   Procedure: INSERTION OF TUNNELED DIALYSIS CATHETER;  Surgeon: Serene Gaile ORN, MD;  Location: [facility] OR;  Service: Vascular;  Laterality: Right;   KNEE ARTHROSCOPY Left 10/25/2006   POLYPECTOMY  03/13/2019   Procedure: POLYPECTOMY;  Surgeon: Albertus Gordy HERO, MD;  Location: [facility] ENDOS[facility];  Service: Gastroenterology;;   REMOVAL OF STONES  02/03/2022   Procedure: REMOVAL OF STONES;  Surgeon: Rollin Dover, MD;  Location: [facility] ENDOSCOPY;  Service: Gastroenterology;;   REVISON OF ARTERIOVENOUS FISTULA Left 08/20/2022   Procedure: REVISON OF LEFT ARM ARTERIOVENOUS FISTULA;  Surgeon: Serene Gaile ORN, MD;  Location: [facility] OR;  Service: Vascular;  Laterality: Left;   RIGHT HEART CATH N/A 07/24/2017   Procedure: RIGHT HEART CATH;  Surgeon: Cherrie Toribio SAUNDERS, MD;  [facility];  Service: Cardiovascular;   Laterality: N/A;   RIGHT HEART CATHETERIZATION N/A 09/22/2013   Procedure: RIGHT HEART CATH;  Surgeon: Toribio SAUNDERS Cherrie, MD;  Location: [facility] CATH LAB;  Service: Cardiovascular;  Laterality: N/A;   SHOULDER ARTHROSCOPY WITH OPEN ROTATOR CUFF REPAIR Right 03/14/2014   Procedure: RIGHT SHOULDER ARTHROSCOPY WITH BICEPS RELEASE, OPEN SUBSCAPULA REPAIR, OPEN SUPRASPINATUS REPAIR.;  Surgeon: Cordella Glendia Hutchinson, MD;  Location: [name] OR;  Service: Orthopedics;  Laterality: Right;   SPHINCTEROTOMY  02/03/2022   Procedure: SPHINCTEROTOMY;  Surgeon: Rollin Dover, MD;  Location: [facility] ENDOSCOPY;  Service: Gastroenterology;;   STUMP REVISION Right 06/13/2022   Procedure: REVISION RIGHT BELOW KNEE AMPUTATION;  Surgeon: Harden Jerona GAILS, MD;  Location: [facility] OR;  Service: Orthopedics;  Laterality: Right;   TEE WITHOUT CARDIOVERSION N/A 02/04/2022   Procedure: TRANSESOPHAGEAL ECHOCARDIOGRAM (TEE);  Surgeon: Cherrie Toribio SAUNDERS, MD;  Location: [facility] ENDOSCOPY;  Service: Cardiovascular;  Laterality: N/A;   THROMBECTOMY W/ EMBOLECTOMY Left 08/20/2022   Procedure: THROMBECTOMY OF LEFT ARM ARTERIOVENOUS FISTULA;  Surgeon: Serene Gaile ORN, MD;  Location: [facility] OR;  Service: Vascular;  Laterality: Left;   TOE AMPUTATION Right 02/24/2018   GREAT TOE AND 2ND TOE AMPUTATION   TUBAL LIGATION  1970's   Patient Active Problem List   Diagnosis Date Noted   Hyperosmolar hyperglycemic state (HHS) (HCC) 02/05/2023   Depression 01/29/2023   Nausea 01/24/2023   Intractable nausea and vomiting 01/20/2023   Proctitis 01/15/2023   Acute on chronic anemia 11/25/2022   Right below-knee amputee (HCC) 11/20/2022   Cellulitis of left lower extremity 11/14/2022   Maggot infestation 11/12/2022   Complicated wound infection 11/10/2022   Secondary hypercoagulable state (HCC) 09/04/2022   Right sided abdominal pain 08/31/2022   Constipation 06/07/2022   History of Clostridioides difficile colitis 06/06/2022   Below-knee amputation of right lower  extremity (HCC) 06/06/2022   Diverticulitis 06/05/2022   Stercoral colitis 06/05/2022   C. 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) 04/07/2022   Allergy, unspecified, initial encounter 04/07/2022   Dependence on renal  dialysis (HCC) 04/07/2022   Gout due to renal impairment, unspecified site 04/07/2022   Hypertensive heart and chronic kidney disease with heart failure and with stage 5 chronic kidney disease, or end stage renal disease (HCC) 04/07/2022   Personal history of transient ischemic attack (TIA), and cerebral infarction without residual deficits 04/07/2022   Renal osteodystrophy 04/07/2022   Venous stasis ulcer of right calf (HCC) 03/31/2022   Fistula, colovaginal 03/26/2022   Diarrhea 03/26/2022   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   Sepsis without acute organ dysfunction (HCC)    Bacteremia    Acute pancreatitis 02/01/2022   Abdominal pain 02/01/2022   SIRS (systemic inflammatory response syndrome) (HCC) 02/01/2022   Transaminitis 02/01/2022   History of anemia due to chronic kidney disease 02/01/2022   Paroxysmal atrial fibrillation (HCC) 02/01/2022   Uncontrolled type 2 diabetes mellitus with hyperglycemia, with long-term current use of insulin  (HCC) 01/14/2022   NSTEMI (non-ST elevated myocardial infarction) (HCC) 03/05/2021   Acute renal failure superimposed on stage 4 chronic kidney disease (HCC) 08/22/2020    Hypoalbuminemia 05/25/2020   GERD (gastroesophageal reflux disease) 05/25/2020   Pressure injury of skin 05/17/2020   Acute on chronic combined systolic and diastolic congestive heart failure (HCC) 03/07/2020   Type 2 diabetes mellitus with diabetic polyneuropathy, with long-term current use of insulin  (HCC) 03/07/2020   Obesity, Class III, BMI 40-49.9 (morbid obesity) (HCC) 03/07/2020   Common bile duct (CBD) obstruction 05/28/2019   Benign neoplasm of ascending colon    Benign neoplasm of transverse colon    Benign neoplasm of descending colon    Benign neoplasm of sigmoid colon    Gastric polyps    Hyperkalemia 03/11/2019   Prolonged QT interval 03/11/2019   Acute blood loss anemia 03/11/2019   Onychomycosis 06/21/2018   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)    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 (HCC)    PVD (peripheral vascular disease) (HCC) 10/26/2017   E-coli UTI 07/27/2017   Hypothyroidism 07/27/2017   AKI (acute kidney injury) (HCC)    PAH (pulmonary artery hypertension) (HCC)    Impaired ambulation 07/19/2017   Nausea & vomiting 07/15/2017   Leg cramps 02/27/2017   Peripheral edema 01/12/2017   Diabetic neuropathy (HCC) 11/12/2016   CKD (chronic kidney disease), stage IV (HCC) 10/24/2015   Anemia of chronic disease 10/03/2015   Historical diagnosis of generalized anxiety disorder 10/03/2015   Secondary insomnia 10/03/2015   Hyperglycemia due to diabetes mellitus (HCC) 06/07/2015   Non compliance with medical treatment 04/17/2014   Rotator cuff tear 03/14/2014   Obesity 09/23/2013   Chronic HFrEF (heart failure with reduced ejection fraction) (HCC) 06/03/2013   Hypotension 12/25/2012   Hyponatremia 12/25/2012   Urinary incontinence    MDD (major depressive disorder), recurrent episode, moderate (HCC) 11/12/2010   RBBB (right bundle branch block)    Wide-complex tachycardia    Coronary artery disease    Hyperlipemia 01/22/2009    Chronic hypotension 01/22/2009    ONSET DATE: 03/09/2023 MD referral to PT  REFERRING DIAG: S88.111D (ICD-10-CM) - Below-knee amputation of right lower extremity, subsequent encounter   THERAPY DIAG:  Unsteadiness on feet  Muscle weakness (generalized)  Phantom limb syndrome with pain (HCC)  Other abnormalities of gait and mobility  Impaired functional mobility, balance, gait, and endurance  Rationale for Evaluation and Treatment: Rehabilitation  SUBJECTIVE:   SUBJECTIVE STATEMENT: She feels is getting better but very slowly.    PERTINENT HISTORY: Right TTA 05/02/22, Lymphedema, idiopathic  chronic venous HTN with ulcer, depression, colitis, diverticulitis, ESRD, gout, NSTEMI, DM2, polyneuropathy, PVD, CAD, right bundle branch block, mini strokes  PAIN:  Are you having pain? Phantom pain with no known aggravation.   PRECAUTIONS: Fall and Other: No BP LUE  WEIGHT BEARING RESTRICTIONS: No  FALLS: Has patient fallen in last 6 months? No  LIVING ENVIRONMENT: Lives with: lives with their spouse and 2 small dogs Lives in: mobile home Home Access: Ramped entrance Home layout: One level Stairs: Yes: External: 6-7 steps; can reach both Has following equipment at home: Single point cane, Environmental Consultant - 2 wheeled, Environmental Consultant - 4 wheeled, Wheelchair (manual), Graybar electric, Grab bars, and Ramped entry  OCCUPATION:  retired  PLOF: prior to amputation for ~year used RW in home & community  PATIENT GOALS:  to use prosthesis to walk, get out w/c in & out of car, get to bathroom.   OBJECTIVE:  COGNITION: Overall cognitive status:  Eval on 03/23/2023:   Within functional limits for tasks assessed  POSTURE:  Eval on 03/23/2023:  rounded shoulders, forward head, increased thoracic kyphosis, flexed trunk , and weight shift left  LOWER EXTREMITY ROM: ROM Right eval Left eval  Hip flexion    Hip extension    Hip abduction    Hip adduction    Hip internal rotation    Hip external rotation     Knee flexion    Knee extension    Ankle dorsiflexion    Ankle plantarflexion    Ankle inversion    Ankle eversion     (Blank rows = not tested)  LOWER EXTREMITY MMT:  MMT Right eval Left eval  Hip flexion 3-/5 3-/5  Hip extension 2/5 2/5  Hip abduction 2+/5 2+/5  Hip adduction    Hip internal rotation    Hip external rotation    Knee flexion 3-/5 3-/5  Knee extension 3-/5 3-/5  Ankle dorsiflexion    Ankle plantarflexion    Ankle inversion    Ankle eversion    At Evaluation all strength testing is grossly seated and functionally standing / gait. (Blank rows = not tested)  TRANSFERS: 05/04/2023: Squat pivot transfers with modA.  04/21/2023: Pt sit to stand w/c to //bar with one hand pushing on w/c and other hand on //bar with modA first time & MinA 2nd/ 3rd time.  Best is RUE on //bar & LUE on w/c.  Stand to sit reaching LUE to w/c with minA to control descent.   PT demo & verbal cues on sit to/from stand w/c to RW.  1st time modA 2 person. 2nd time maxA one person.  Stand to sit with minA and constant cueing. Scooting transfers with minA with w/c & mat direct contact & same ht.   Eval on 03/23/2023:  Sit to stand: Max A (two-person assist) from 22 w/c to rolling walker Stand to sit: Max A (two-person assist) rolling walker to wheelchair  FUNCTIONAL TESTs:   04/21/2023: Pt able to stand 60 sec with BUE on //bars with supervision.  Pt able to stand for 1 min with RW support with minA.  Eval on 03/23/2023: Patient maintained upright holding rolling walker with mod assist 2 people for safety for 30 seconds.  GAIT: 04/21/2023: Pt amb 5' in //bars with modA (2 person for safety) with PT verbal & tactile cues on sequence and weight shift.  Pt amb 10' with RW with maxA (2 person for safety) with PT verbal & tactile cues on sequence and weight shift.  Eval on 03/23/2023:  Patient took 2 steps (one per LE) with +2 max assist with rolling walker and TTA prosthesis.   Patient adducting prosthesis stepping on left foot with no awareness.  CURRENT PROSTHETIC WEAR ASSESSMENT:  Eval on 03/23/2023:   Patient is dependent with: skin check, residual limb care, prosthetic cleaning, ply sock cleaning, correct ply sock adjustment, proper wear schedule/adjustment, and proper weight-bearing schedule/adjustment Donning prosthesis: Max A Doffing prosthesis: Min A Prosthetic wear tolerance: 2-6 hours, 1x/day, 3-4 days/week Prosthetic weight bearing tolerance: 3 minutes with limb pain Edema: pitting edema Residual limb condition: No open areas, dry skin, normal color and temperature, cylindrical shape Prosthetic description: Silicone liner with pin lock suspension, total contact socket with flexible inner socket, sach foot    TODAY'S TREATMENT:                                                                                                                             DATE:  06/03/2023:  Therapeutic Activities: Stand pivot transfer with RW  w/c to Nustep going to her left with modA (2nd person for safety) & Nustep to w/c going to her right with modA.  RUE contact assist on RW. PT demo & verbal cues on technique & set-up  Therapeutic Exercise: Nustep seat 9 level 5 with LUE & BLEs for 4 min, then Level 3 with BLEs only for 4 min.  Prosthetic Training: Pt amb 5' X 2 with RW with maxA +2 person assist. 1st walk pt crossed prosthesis over LLE and could not correct so PT sat pt in w/c.  Pt is very anxious and needed PT cues to control sequence and timing. PT used theraband on RW for visual cues for foot placement.  Sat on 24 bar stool with BUEs on RW practiced stepping feet forward behind visual target on RW for 1 minute.  Pt amb 7' with RW with MaxA +2 person assist with PT giving slow 1 step commands for sequence.  Pt improved last gait.     TREATMENT:                                                                                                                              DATE:  06/01/2023: Therapeutic Activities: Stand pivot transfer with RW  w/c to Nustep going to her left with modA (2nd person for safety) & Nustep to w/c going to her right with modA.  RUE contact assist on RW. PT demo & verbal cues on technique & set-up Squat pivot transfer leg press to w/c with minA.  Therapeutic Exercise: Nustep seat 9 level 5 with LUE & BLEs for 5 min, then Level 3 with BLEs only for 3 min. Leg press BLEs 62# 10 reps 2 sets;  marching to simulate SLS in midstance with knee buckling - 37# BLEs concentric & eccentric and isometric hold while lifting contralateral off plate -  5 reps LLE lead and 5 reps RLE lead;   Prosthetic Training: Pt amb 5' with RW with maxA +2 person assist.  PT verbal cues on sequence.    TREATMENT:                                                                                                                             DATE:  05/26/2023: Therapeutic Activities: Stand pivot transfer with RW  w/c to Nustep going to her left with modA (2nd person for safety) & Nustep to w/c going to her right with modA.  RUE contact assist on RW. PT demo & verbal cues on technique & set-up Squat pivot transfer leg press to w/c with minA.  Therapeutic Exercise: Nustep seat 9 level 5 with LUE & BLEs for 4 min, then Level 3 with BLEs only for 4 min. Leg press BLEs 56# 10 reps 2 sets;  marching to simulate SLS in midstance with knee buckling - 25# BLEs concentric & eccentric and isometric hold while lifting contralateral off plate -  5 reps LLE lead and 5 reps RLE lead;  single leg 25# 5 reps 2 sets ea LE.  Prosthetic Training: Pt amb 5' with RW with maxA +2 person assist.  PT verbal cues on sequence. PT recommended appt with prosthetist to have pads added to socket and do an alignment check.  Once pads are added, then she will need less socks depending on size & thickness of pads.  Pt & husband verbalized understanding.     PATIENT EDUCATION: PATIENT EDUCATED ON  FOLLOWING PROSTHETIC CARE: Education details: Use of stockinette under proximal liner to decrease itching sensation, no wounds presents to PT recommended not using Vive wear under liner Skin check, Prosthetic cleaning, Propper donning, and Proper wear schedule/adjustment Prosthetic wear tolerance: 3 hours 2x/day, 4 days/week Person educated: Patient and Spouse Education method: Explanation, Tactile cues, and Verbal cues Education comprehension: verbalized understanding, verbal cues required, tactile cues required, and needs further education  HOME EXERCISE PROGRAM: Access Code: [email or web address] URL: [email or web address] Date: 04/15/2023 Prepared by: Grayce Spatz  Exercises - Supine Bridge  - 1 x daily - 4 x weekly - 2 sets - 5 reps - 2 seconds hold - Supine Lower Trunk Rotation  - 1 x daily - 4 x weekly - 2 sets - 5 reps - 5 seconds hold - Supine Heel Slide with Strap  - 1 x daily - 4 x weekly - 2 sets - 5 reps - 2-3  seconds hold - Supine Hip Abduction  - 1 x daily - 4 x weekly - 2 sets - 5 reps - 2-3 seconds hold - Seated Hip Flexion Toward Target  - 1 x daily - 4 x weekly - 2 sets - 5 reps - 5 seconds hold - Seated Eccentric Abdominal Lean Back  - 1 x daily - 4 x weekly - 2 sets - 5 reps - 5 seconds hold - Seated Sidebending  - 1 x daily - 4 x weekly - 2 sets - 5 reps - 5 seconds hold - Seated Trunk Rotation with Crossed Arms  - 1 x daily - 4 x weekly - 2 sets - 5 reps - 5 seconds hold   ASSESSMENT:  CLINICAL IMPRESSION: PT began to work on functional gait.  Previous session of gait with short distance to get on leg press machine.  When PT attempted to progress to walking into bathroom short distance, patient was so anxious that she had difficulty following PT directions. Adding visual cues for foot placement and giving 1 step commands helped to improve her gait.    Patient continues to make slow progress.    OBJECTIVE IMPAIRMENTS: Abnormal gait, decreased activity tolerance,  decreased balance, decreased endurance, decreased knowledge of condition, decreased knowledge of use of DME, decreased mobility, difficulty walking, decreased ROM, decreased strength, decreased safety awareness, postural dysfunction, prosthetic dependency , and pain.   ACTIVITY LIMITATIONS: standing, transfers, and locomotion level  PARTICIPATION LIMITATIONS: community activity, household mobility and dependency / burden of care on family  PERSONAL FACTORS: Age, Fitness, Past/current experiences, Time since onset of injury/illness/exacerbation, and 3+ comorbidities: see PMH  are also affecting patient's functional outcome.   REHAB POTENTIAL: Good  CLINICAL DECISION MAKING: Evolving/moderate complexity  EVALUATION COMPLEXITY: Moderate   GOALS: Goals reviewed with patient? Yes  SHORT TERM GOALS: Target date: 04/22/2023:  Patient donnes prosthesis correctly with husband's assist & verbalizes proper cleaning. Baseline: SEE OBJECTIVE DATA Goal status:   MET 04/21/2023 2.  Patient tolerates prosthesis wear on non-dailysis days >8 hrs total /day and on dialysis days >2 hours/day without skin issues or limb pain >5/10. Baseline: SEE OBJECTIVE DATA Goal status:  partially MET 04/21/2023  3.  Patient able to stand with rolling walker with 1 person modA for 1 min.  Baseline: SEE OBJECTIVE DATA Goal status: MET 04/21/2023  4. Patient ambulates 5' with RW & prosthesis with +2 maxA.  Baseline: SEE OBJECTIVE DATA Goal status: MET 04/21/2023  5. Patient sit to/from stand transfer with +1 maxA and scooting transfer with modA. Baseline: SEE OBJECTIVE DATA Goal status: MET 04/21/2023  LONG TERM GOALS: Target date: 06/17/2022  Patient & husband demonstrates & verbalizes understanding of prosthetic care to enable safe utilization of prosthesis. Baseline: SEE OBJECTIVE DATA Goal status: Ongoing 06/01/2023  Patient tolerates prosthesis wear >80% of awake hours on non-dialysis days and >50% on  dialysis days without skin issues and limb pain </= 4/10. Baseline: SEE OBJECTIVE DATA Goal status: Ongoing 06/01/2023  Scooting transfers with minA and sit to/from stand transfers with modA.  Baseline: SEE OBJECTIVE DATA Goal status: Ongoing 06/01/2023  Patient ambulates >10' with prosthesis & RW with modA.  Baseline: SEE OBJECTIVE DATA Goal status: Ongoing  06/01/2023  Patient stands with RW support with minA or less for 2 min.  Baseline: SEE OBJECTIVE DATA Goal status:   Ongoing 06/01/2023   PLAN:  PT FREQUENCY: 2x/week  PT DURATION: 12 weeks  PLANNED INTERVENTIONS: 97164- PT Re-evaluation, 97110-Therapeutic exercises, 97530- Therapeutic  activity, [id number]- Neuromuscular re-education, [phone]- Self Care, [id number]- Gait training, [phone]- Prosthetic training, Patient/Family education, Balance training, DME instructions, Therapeutic exercises, Therapeutic activity, Neuromuscular re-education, Gait training, and Self Care  PLAN FOR NEXT SESSION     continue Nustep.  Leg press.  Standing with RW support. Stand pivot transfer with RW. Gait short distances with RW using visual cues.   Try to add static stance with RW.    Grayce Spatz, PT, DPT 06/03/2023, 2:58 PM

## 2023-06-05 ENCOUNTER — Telehealth: Payer: Self-pay

## 2023-06-05 MED ORDER — LORAZEPAM 0.5 MG PO TABS: 0.5000 mg | ORAL_TABLET | Freq: Every day | ORAL | 2 refills | Status: DC

## 2023-06-05 NOTE — Telephone Encounter (Signed)
 Copied from CRM [id number]. Topic: Clinical - Prescription Issue >> Jun 05, 2023 11:49 AM Susan [name] wrote: Reason for CRM: Patient still waiting to hear back about LORazepam (ATIVAN) 0.5 MG tablet

## 2023-06-05 NOTE — Telephone Encounter (Signed)
 Attempted to call pt back re msg about pt's referral to podiatry? No answer, LVM for pt to call back.

## 2023-06-08 ENCOUNTER — Ambulatory Visit: Payer: PPO | Admitting: Physical Therapy

## 2023-06-08 ENCOUNTER — Encounter: Payer: Self-pay | Admitting: Physical Therapy

## 2023-06-08 DIAGNOSIS — Z992 Dependence on renal dialysis: Secondary | ICD-10-CM | POA: Diagnosis not present

## 2023-06-08 DIAGNOSIS — R2681 Unsteadiness on feet: Secondary | ICD-10-CM | POA: Diagnosis not present

## 2023-06-08 DIAGNOSIS — Z7409 Other reduced mobility: Secondary | ICD-10-CM | POA: Diagnosis not present

## 2023-06-08 DIAGNOSIS — D509 Iron deficiency anemia, unspecified: Secondary | ICD-10-CM | POA: Diagnosis not present

## 2023-06-08 DIAGNOSIS — R2689 Other abnormalities of gait and mobility: Secondary | ICD-10-CM

## 2023-06-08 DIAGNOSIS — M6281 Muscle weakness (generalized): Secondary | ICD-10-CM

## 2023-06-08 DIAGNOSIS — G546 Phantom limb syndrome with pain: Secondary | ICD-10-CM

## 2023-06-08 DIAGNOSIS — N2581 Secondary hyperparathyroidism of renal origin: Secondary | ICD-10-CM | POA: Diagnosis not present

## 2023-06-08 DIAGNOSIS — D689 Coagulation defect, unspecified: Secondary | ICD-10-CM | POA: Diagnosis not present

## 2023-06-08 DIAGNOSIS — N186 End stage renal disease: Secondary | ICD-10-CM | POA: Diagnosis not present

## 2023-06-08 DIAGNOSIS — E1122 Type 2 diabetes mellitus with diabetic chronic kidney disease: Secondary | ICD-10-CM | POA: Diagnosis not present

## 2023-06-08 NOTE — Therapy (Signed)
 OUTPATIENT PHYSICAL THERAPY PROSTHETIC TREATMENT   Patient Name: Susan [name] MRN: [id number] DOB:[birth year], 73 y.o., female Today's Date: 06/08/2023  PCP: Duanne Butler DASEN, MD  REFERRING PROVIDER: Harden Jerona GAILS, MD  END OF SESSION:  PT End of Session - 06/08/23 1350     Visit Number 17    Number of Visits 25    Date for PT Re-Evaluation 06/18/23    Authorization Type [employer]    Progress Note Due on Visit 20    PT Start Time 1340    PT Stop Time 1425    PT Time Calculation (min) 45 min    Equipment Utilized During Treatment Gait belt    Activity Tolerance Patient tolerated treatment well;Patient limited by fatigue    Behavior During Therapy WFL for tasks assessed/performed                          Past Medical History:  Diagnosis Date   Anemia    hx   Anxiety    Arthritis    generalized (03/15/2014)   CAD (coronary artery disease)    MI in 2000 - MI  2007 - treated bare metal stent (no nuclear since then as 9/11)   Carotid artery disease (HCC)    Chronic diastolic heart failure (HCC)    a) ECHO (08/2013) EF 55-60% and RV function nl b) RHC (08/2013) RA 4, RV 30/5/7, PA 25/10 (16), PCWP 7, Fick CO/CI 6.3/2.7, PVR 1.5 WU, PA 61 and 66%   Daily headache    ~ every other day; since I fell in June (03/15/2014)   Depression    Diabetic retinopathy (HCC)    Dyslipidemia    ESRD (end stage renal disease) (HCC)    Dialysis on Tues Thurs Sat   Exertional shortness of breath    GERD (gastroesophageal reflux disease)    History of blood transfusion    History of kidney stones    HTN (hypertension)    Hypothyroidism    Myocardial infarction (HCC)    Obesity    Osteoarthritis    PAF (paroxysmal atrial fibrillation) (HCC)    Peripheral neuropathy    bilateral feet/hands   PONV (postoperative nausea and vomiting)    RBBB (right bundle branch block)    Old   Stroke (HCC)    mini strokes   Type II diabetes mellitus (HCC)    Type  II, [place] libre left upper arm. patient has omnipod insulin  pump with Novolin R Insulin    Past Surgical History:  Procedure Laterality Date   A/V FISTULAGRAM Left 11/07/2022   Procedure: A/V Fistulagram;  Surgeon: Magda Debby SAILOR, MD;  [facility]: [facility];  Service: Cardiovascular;  Laterality: Left;   ABDOMINAL HYSTERECTOMY  1980's   AMPUTATION Right 02/24/2018   Procedure: RIGHT FOOT GREAT TOE AND 2ND TOE AMPUTATION;  Surgeon: Harden Jerona GAILS, MD;  Location: [facility] OR;  Service: Orthopedics;  Laterality: Right;   AMPUTATION Right 04/30/2018   Procedure: RIGHT TRANSMETATARSAL AMPUTATION;  Surgeon: Harden Jerona GAILS, MD;  Location: [facility] OR;  Service: Orthopedics;  Laterality: Right;   AMPUTATION Right 05/02/2022   Procedure: RIGHT BELOW KNEE AMPUTATION;  Surgeon: Harden Jerona GAILS, MD;  Location: [facility] OR;  Service: Orthopedics;  Laterality: Right;   APPLICATION OF WOUND VAC Right 06/13/2022   Procedure: APPLICATION OF WOUND VAC;  Surgeon: Harden Jerona GAILS, MD;  Location: [facility] OR;  Service: Orthopedics;  Laterality: Right;   APPLICATION OF  WOUND VAC Left 11/14/2022   Procedure: APPLICATION OF WOUND VAC;  Surgeon: Harden Jerona GAILS, MD;  Location: [facility] OR;  Service: Orthopedics;  Laterality: Left;   AV FISTULA PLACEMENT Left 04/02/2022   Procedure: LEFT ARM ARTERIOVENOUS (AV) FISTULA CREATION;  Surgeon: Serene Gaile ORN, MD;  Location: [facility] OR;  Service: Vascular;  Laterality: Left;  PERIPHERAL NERVE BLOCK   AV FISTULA PLACEMENT Right 04/27/2023   Procedure: RIGHT ARM BRACHIOBASILIC ARTERIOVENOUS (AV) FISTULA CREATION;  Surgeon: Magda Debby SAILOR, MD;  Location: [facility] OR;  Service: Vascular;  Laterality: Right;   BASCILIC VEIN TRANSPOSITION Left 07/31/2022   Procedure: LEFT ARM SECOND STAGE BASILIC VEIN TRANSPOSITION;  Surgeon: Serene Gaile ORN, MD;  Location: [facility] OR;  Service: Vascular;  Laterality: Left;   BIOPSY  05/27/2020   Procedure: BIOPSY;  Surgeon: Cindie Carlin POUR, DO;  Location: [facility]O [facility];  Service:  Endoscopy;;   CATARACT EXTRACTION, BILATERAL Bilateral ?2013   COLONOSCOPY W/ POLYPECTOMY     COLONOSCOPY WITH PROPOFOL  N/A 03/13/2019   Procedure: COLONOSCOPY WITH PROPOFOL ;  Surgeon: Albertus Gordy HERO, MD;  Location: [facility] ENDOSCOPY;  Service: Gastroenterology;  Laterality: N/A;   CORONARY ANGIOPLASTY WITH STENT PLACEMENT  1999; 2007   1 + 1   ERCP N/A 02/03/2022   Procedure: ENDOSCOPIC RETROGRADE CHOLANGIOPANCREATOGRAPHY (ERCP);  Surgeon: Rollin Dover, MD;  Location: [facility] ENDOSCOPY;  Service: Gastroenterology;  Laterality: N/A;   ESOPHAGOGASTRODUODENOSCOPY N/A 02/12/2023   Procedure: ESOPHAGOGASTRODUODENOSCOPY (EGD);  Surgeon: Rollin Dover, MD;  Location: [facility] ENDOSCOPY;  Service: Gastroenterology;  Laterality: N/A;   ESOPHAGOGASTRODUODENOSCOPY (EGD) WITH PROPOFOL  N/A 03/13/2019   Procedure: ESOPHAGOGASTRODUODENOSCOPY (EGD) WITH PROPOFOL ;  Surgeon: Albertus Gordy HERO, MD;  Location: [facility] ENDOSCOPY;  Service: Gastroenterology;  Laterality: N/A;   ESOPHAGOGASTRODUODENOSCOPY (EGD) WITH PROPOFOL  N/A 05/27/2020   Procedure: ESOPHAGOGASTRODUODENOSCOPY (EGD) WITH PROPOFOL ;  Surgeon: Cindie Carlin POUR, DO;  [facility];  Service: Endoscopy;  Laterality: N/A;   ESOPHAGOGASTRODUODENOSCOPY (EGD) WITH PROPOFOL  N/A 09/03/2022   Procedure: ESOPHAGOGASTRODUODENOSCOPY (EGD) WITH PROPOFOL ;  Surgeon: Rollin Dover, MD;  Location: [facility] ENDOSCOPY;  Service: Gastroenterology;  Laterality: N/A;   EYE SURGERY Bilateral    lazer   FLEXIBLE SIGMOIDOSCOPY N/A 05/23/2022   Procedure: FLEXIBLE SIGMOIDOSCOPY;  Surgeon: Rollin Dover, MD;  Location: [facility] ENDOSCOPY;  Service: Gastroenterology;  Laterality: N/A;   FLEXIBLE SIGMOIDOSCOPY N/A 05/24/2022   Procedure: FLEXIBLE SIGMOIDOSCOPY;  Surgeon: Federico Rosario BROCKS, MD;  Location: [facility] ENDOSCOPY;  Service: Gastroenterology;  Laterality: N/A;   FLEXIBLE SIGMOIDOSCOPY N/A 09/03/2022   Procedure: FLEXIBLE SIGMOIDOSCOPY;  Surgeon: Rollin Dover, MD;  Location: [facility] ENDOSCOPY;  Service:  Gastroenterology;  Laterality: N/A;   HEMOSTASIS CLIP PLACEMENT  03/13/2019   Procedure: HEMOSTASIS CLIP PLACEMENT;  Surgeon: Albertus Gordy HERO, MD;  Location: [facility] ENDOSCOPY;  Service: Gastroenterology;;   HEMOSTASIS CLIP PLACEMENT  05/23/2022   Procedure: HEMOSTASIS CLIP PLACEMENT;  Surgeon: Rollin Dover, MD;  Location: [facility] ENDOSCOPY;  Service: Gastroenterology;;   HEMOSTASIS CONTROL  05/24/2022   Procedure: HEMOSTASIS CONTROL;  Surgeon: Federico Rosario BROCKS, MD;  Location: [facility] ENDOSCOPY;  Service: Gastroenterology;;   HOT HEMOSTASIS N/A 05/23/2022   Procedure: HOT HEMOSTASIS (ARGON PLASMA COAGULATION/BICAP);  Surgeon: Rollin Dover, MD;  Location: [facility] ENDOSCOPY;  Service: Gastroenterology;  Laterality: N/A;   I & D EXTREMITY Left 05/05/2022   Procedure: IRRIGATION AND DEBRIDEMENT LEFT ARM AV FISTULA;  Surgeon: Gretta Lonni PARAS, MD;  Location: [facility] OR;  Service: Vascular;  Laterality: Left;   I & D EXTREMITY N/A 11/14/2022   Procedure: IRRIGATION AND DEBRIDEMENT OF LOWER EXTREMITY  WOUND;  Surgeon: Harden Jerona GAILS, MD;  Location: [facility] OR;  Service: Orthopedics;  Laterality: N/A;   INSERTION OF DIALYSIS CATHETER Right 04/02/2022   Procedure: INSERTION OF TUNNELED DIALYSIS CATHETER;  Surgeon: Serene Gaile ORN, MD;  Location: [facility] OR;  Service: Vascular;  Laterality: Right;   KNEE ARTHROSCOPY Left 10/25/2006   POLYPECTOMY  03/13/2019   Procedure: POLYPECTOMY;  Surgeon: Albertus Gordy HERO, MD;  Location: [facility] ENDOSCOPY;  Service: Gastroenterology;;   REMOVAL OF STONES  02/03/2022   Procedure: REMOVAL OF STONES;  Surgeon: Rollin Dover, MD;  Location: [facility] ENDOSCOPY;  Service: Gastroenterology;;   REVISON OF ARTERIOVENOUS FISTULA Left 08/20/2022   Procedure: REVISON OF LEFT ARM ARTERIOVENOUS FISTULA;  Surgeon: Serene Gaile ORN, MD;  Location: [facility] OR;  Service: Vascular;  Laterality: Left;   RIGHT HEART CATH N/A 07/24/2017   Procedure: RIGHT HEART CATH;  Surgeon: Cherrie Toribio SAUNDERS, MD;  Location: [facility];  Service:  Cardiovascular;  Laterality: N/A;   RIGHT HEART CATHETERIZATION N/A 09/22/2013   Procedure: RIGHT HEART CATH;  Surgeon: Toribio SAUNDERS Cherrie, MD;  Location: [facility] CATH LAB;  Service: Cardiovascular;  Laterality: N/A;   SHOULDER ARTHROSCOPY WITH OPEN ROTATOR CUFF REPAIR Right 03/14/2014   Procedure: RIGHT SHOULDER ARTHROSCOPY WITH BICEPS RELEASE, OPEN SUBSCAPULA REPAIR, OPEN SUPRASPINATUS REPAIR.;  Surgeon: Cordella Glendia Hutchinson, MD;  Location: [facility] OR;  Service: Orthopedics;  Laterality: Right;   SPHINCTEROTOMY  02/03/2022   Procedure: SPHINCTEROTOMY;  Surgeon: Rollin Dover, MD;  Location: [facility] ENDOSCOPY;  Service: Gastroenterology;;   STUMP REVISION Right 06/13/2022   Procedure: REVISION RIGHT BELOW KNEE AMPUTATION;  Surgeon: Harden Jerona GAILS, MD;  Location: [facility] OR;  Service: Orthopedics;  Laterality: Right;   TEE WITHOUT CARDIOVERSION N/A 02/04/2022   Procedure: TRANSESOPHAGEAL ECHOCARDIOGRAM (TEE);  Surgeon: Cherrie Toribio SAUNDERS, MD;  Location: [facility] ENDOSCOPY;  Service: Cardiovascular;  Laterality: N/A;   THROMBECTOMY W/ EMBOLECTOMY Left 08/20/2022   Procedure: THROMBECTOMY OF LEFT ARM ARTERIOVENOUS FISTULA;  Surgeon: Serene Gaile ORN, MD;  Location: [facility] OR;  Service: Vascular;  Laterality: Left;   TOE AMPUTATION Right 02/24/2018   GREAT TOE AND 2ND TOE AMPUTATION   TUBAL LIGATION  1970's   Patient Active Problem List   Diagnosis Date Noted   Hyperosmolar hyperglycemic state (HHS) (HCC) 02/05/2023   Depression 01/29/2023   Nausea 01/24/2023   Intractable nausea and vomiting 01/20/2023   Proctitis 01/15/2023   Acute on chronic anemia 11/25/2022   Right below-knee amputee (HCC) 11/20/2022   Cellulitis of left lower extremity 11/14/2022   Maggot infestation 11/12/2022   Complicated wound infection 11/10/2022   Secondary hypercoagulable state (HCC) 09/04/2022   Right sided abdominal pain 08/31/2022   Constipation 06/07/2022   History of Clostridioides difficile colitis 06/06/2022   Below-knee amputation of  right lower extremity (HCC) 06/06/2022   Diverticulitis 06/05/2022   Stercoral colitis 06/05/2022   C. 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) 04/07/2022   Allergy, unspecified, initial encounter 04/07/2022   Dependence on renal  dialysis (HCC) 04/07/2022   Gout due to renal impairment, unspecified site 04/07/2022   Hypertensive heart and chronic kidney disease with heart failure and with stage 5 chronic kidney disease, or end stage renal disease (HCC) 04/07/2022   Personal history of transient ischemic attack (TIA), and cerebral infarction without residual deficits 04/07/2022   Renal osteodystrophy 04/07/2022   Venous stasis ulcer of right calf (HCC) 03/31/2022   Fistula, colovaginal 03/26/2022   Diarrhea 03/26/2022   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   Sepsis without acute organ dysfunction (HCC)    Bacteremia    Acute pancreatitis 02/01/2022   Abdominal pain 02/01/2022   SIRS (systemic inflammatory response syndrome) (HCC) 02/01/2022   Transaminitis 02/01/2022   History of anemia due to chronic kidney disease 02/01/2022   Paroxysmal atrial fibrillation (HCC) 02/01/2022   Uncontrolled type 2 diabetes mellitus with hyperglycemia, with long-term current use of insulin  (HCC) 01/14/2022   NSTEMI (non-ST elevated myocardial infarction) (HCC) 03/05/2021   Acute renal failure superimposed on stage 4 chronic kidney disease (HCC)  08/22/2020   Hypoalbuminemia 05/25/2020   GERD (gastroesophageal reflux disease) 05/25/2020   Pressure injury of skin 05/17/2020   Acute on chronic combined systolic and diastolic congestive heart failure (HCC) 03/07/2020   Type 2 diabetes mellitus with diabetic polyneuropathy, with long-term current use of insulin  (HCC) 03/07/2020   Obesity, Class III, BMI 40-49.9 (morbid obesity) (HCC) 03/07/2020   Common bile duct (CBD) obstruction 05/28/2019   Benign neoplasm of ascending colon    Benign neoplasm of transverse colon    Benign neoplasm of descending colon    Benign neoplasm of sigmoid colon    Gastric polyps    Hyperkalemia 03/11/2019   Prolonged QT interval 03/11/2019   Acute blood loss anemia 03/11/2019   Onychomycosis 06/21/2018   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)    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 (HCC)    PVD (peripheral vascular disease) (HCC) 10/26/2017   E-coli UTI 07/27/2017   Hypothyroidism 07/27/2017   AKI (acute kidney injury) (HCC)    PAH (pulmonary artery hypertension) (HCC)    Impaired ambulation 07/19/2017   Nausea & vomiting 07/15/2017   Leg cramps 02/27/2017   Peripheral edema 01/12/2017   Diabetic neuropathy (HCC) 11/12/2016   CKD (chronic kidney disease), stage IV (HCC) 10/24/2015   Anemia of chronic disease 10/03/2015   Historical diagnosis of generalized anxiety disorder 10/03/2015   Secondary insomnia 10/03/2015   Hyperglycemia due to diabetes mellitus (HCC) 06/07/2015   Non compliance with medical treatment 04/17/2014   Rotator cuff tear 03/14/2014   Obesity 09/23/2013   Chronic HFrEF (heart failure with reduced ejection fraction) (HCC) 06/03/2013   Hypotension 12/25/2012   Hyponatremia 12/25/2012   Urinary incontinence    MDD (major depressive disorder), recurrent episode, moderate (HCC) 11/12/2010   RBBB (right bundle branch block)    Wide-complex tachycardia    Coronary artery disease    Hyperlipemia  01/22/2009   Chronic hypotension 01/22/2009    ONSET DATE: 03/09/2023 MD referral to PT  REFERRING DIAG: S88.111D (ICD-10-CM) - Below-knee amputation of right lower extremity, subsequent encounter   THERAPY DIAG:  Unsteadiness on feet  Muscle weakness (generalized)  Phantom limb syndrome with pain (HCC)  Other abnormalities of gait and mobility  Impaired functional mobility, balance, gait, and endurance  Rationale for Evaluation and Treatment: Rehabilitation  SUBJECTIVE:   SUBJECTIVE STATEMENT: She feels is getting better but very slowly.    PERTINENT HISTORY: Right TTA 05/02/22, Lymphedema, idiopathic  chronic venous HTN with ulcer, depression, colitis, diverticulitis, ESRD, gout, NSTEMI, DM2, polyneuropathy, PVD, CAD, right bundle branch block, mini strokes  PAIN:  Are you having pain? Phantom pain with no known aggravation.   PRECAUTIONS: Fall and Other: No BP LUE  WEIGHT BEARING RESTRICTIONS: No  FALLS: Has patient fallen in last 6 months? No  LIVING ENVIRONMENT: Lives with: lives with their spouse and 2 small dogs Lives in: mobile home Home Access: Ramped entrance Home layout: One level Stairs: Yes: External: 6-7 steps; can reach both Has following equipment at home: Single point cane, Environmental Consultant - 2 wheeled, Environmental Consultant - 4 wheeled, Wheelchair (manual), Graybar electric, Grab bars, and Ramped entry  OCCUPATION:  retired  PLOF: prior to amputation for ~year used RW in home & community  PATIENT GOALS:  to use prosthesis to walk, get out w/c in & out of car, get to bathroom.   OBJECTIVE:  COGNITION: Overall cognitive status:  Eval on 03/23/2023:   Within functional limits for tasks assessed  POSTURE:  Eval on 03/23/2023:  rounded shoulders, forward head, increased thoracic kyphosis, flexed trunk , and weight shift left  LOWER EXTREMITY ROM: ROM Right eval Left eval  Hip flexion    Hip extension    Hip abduction    Hip adduction    Hip internal rotation    Hip  external rotation    Knee flexion    Knee extension    Ankle dorsiflexion    Ankle plantarflexion    Ankle inversion    Ankle eversion     (Blank rows = not tested)  LOWER EXTREMITY MMT:  MMT Right eval Left eval  Hip flexion 3-/5 3-/5  Hip extension 2/5 2/5  Hip abduction 2+/5 2+/5  Hip adduction    Hip internal rotation    Hip external rotation    Knee flexion 3-/5 3-/5  Knee extension 3-/5 3-/5  Ankle dorsiflexion    Ankle plantarflexion    Ankle inversion    Ankle eversion    At Evaluation all strength testing is grossly seated and functionally standing / gait. (Blank rows = not tested)  TRANSFERS: 05/04/2023: Squat pivot transfers with modA.  04/21/2023: Pt sit to stand w/c to //bar with one hand pushing on w/c and other hand on //bar with modA first time & MinA 2nd/ 3rd time.  Best is RUE on //bar & LUE on w/c.  Stand to sit reaching LUE to w/c with minA to control descent.   PT demo & verbal cues on sit to/from stand w/c to RW.  1st time modA 2 person. 2nd time maxA one person.  Stand to sit with minA and constant cueing. Scooting transfers with minA with w/c & mat direct contact & same ht.   Eval on 03/23/2023:  Sit to stand: Max A (two-person assist) from 22 w/c to rolling walker Stand to sit: Max A (two-person assist) rolling walker to wheelchair  FUNCTIONAL TESTs:   04/21/2023: Pt able to stand 60 sec with BUE on //bars with supervision.  Pt able to stand for 1 min with RW support with minA.  Eval on 03/23/2023: Patient maintained upright holding rolling walker with mod assist 2 people for safety for 30 seconds.  GAIT: 04/21/2023: Pt amb 5' in //bars with modA (2 person for safety) with PT verbal & tactile cues on sequence and weight shift.  Pt amb 10' with RW with maxA (2 person for safety) with PT verbal & tactile cues on sequence and weight shift.  Eval on 03/23/2023:  Patient took 2 steps (one per LE) with +2 max assist with rolling walker and TTA  prosthesis.  Patient adducting prosthesis stepping on left foot with no awareness.  CURRENT PROSTHETIC WEAR ASSESSMENT:  Eval on 03/23/2023:   Patient is dependent with: skin check, residual limb care, prosthetic cleaning, ply sock cleaning, correct ply sock adjustment, proper wear schedule/adjustment, and proper weight-bearing schedule/adjustment Donning prosthesis: Max A Doffing prosthesis: Min A Prosthetic wear tolerance: 2-6 hours, 1x/day, 3-4 days/week Prosthetic weight bearing tolerance: 3 minutes with limb pain Edema: pitting edema Residual limb condition: No open areas, dry skin, normal color and temperature, cylindrical shape Prosthetic description: Silicone liner with pin lock suspension, total contact socket with flexible inner socket, sach foot    TODAY'S TREATMENT:                                                                                                                             DATE:  06/03/2023:  Therapeutic Activities: Stand pivot transfer with RW  w/c to Nustep going to her left with modA (2nd person for safety) & Nustep to w/c going to her right with modA.  RUE contact assist on RW. PT demo & verbal cues on technique & set-up  Therapeutic Exercise: Nustep seat 9 level 5 with LUE & BLEs for 4 min, then Level 3 with BLEs only for 4 min.  Prosthetic Training: Pt amb 5' X 2 with RW with maxA +2 person assist. 1st walk pt crossed prosthesis over LLE and could not correct so PT sat pt in w/c.  Pt is very anxious and needed PT cues to control sequence and timing. PT used theraband on RW for visual cues for foot placement.  Sat on 24 bar stool with BUEs on RW practiced stepping feet forward behind visual target on RW for 1 minute.  Pt amb 7' with RW with MaxA +2 person assist with PT giving slow 1 step commands for sequence.  Pt improved last gait.     TREATMENT:                                                                                                                              DATE:  06/01/2023: Therapeutic Activities: Stand pivot transfer with RW  w/c to Nustep going to her left with modA (2nd person for safety) & Nustep to w/c going to her right with modA.  RUE contact assist on RW. PT demo & verbal cues on technique & set-up Squat pivot transfer leg press to w/c with minA.  Therapeutic Exercise: Nustep seat 9 level 5 with LUE & BLEs for 5 min, then Level 3 with BLEs only for 3 min. Leg press BLEs 62# 10 reps 2 sets;  marching to simulate SLS in midstance with knee buckling - 37# BLEs concentric & eccentric and isometric hold while lifting contralateral off plate -  5 reps LLE lead and 5 reps RLE lead;   Prosthetic Training: Pt amb 5' with RW with maxA +2 person assist.  PT verbal cues on sequence.    TREATMENT:                                                                                                                             DATE:  06/08/23 Therapeutic Activities: Stand pivot transfer with RW  w/c to Nustep going to her left with modA (2nd person for safety (husband)) & Nustep to w/c going to her right with modA.  RUE contact assist on RW. PT demo & verbal cues on technique & set-up Squat pivot transfer leg press to w/c with minA.  Therapeutic Exercise: Nustep seat 9 level 5 with LUE & BLEs for 4 min, then Level 4 with BLEs only for 4 min. Leg press BLEs 56# 10 reps 2 sets;  marching to simulate SLS in midstance with knee buckling - 25# BLEs concentric & eccentric and isometric hold while lifting contralateral off plate -  5 reps LLE lead and 5 reps RLE lead;  single leg 25# 5 reps 2 sets ea LE.  Prosthetic Training: Pt amb 15' X 2  and one final round of 20 feet, all with RW with max A and wheelchair follow (by husband).  PT verbal cues on sequence.  05/26/2023: Therapeutic Activities: Stand pivot transfer with RW  w/c to Nustep going to her left with modA (2nd person for safety) & Nustep to w/c going to her right with modA.  RUE contact  assist on RW. PT demo & verbal cues on technique & set-up Squat pivot transfer leg press to w/c with minA.  Therapeutic Exercise: Nustep seat 9 level 5 with LUE & BLEs for 4 min, then Level 3 with BLEs only for 4 min. Leg press BLEs 56# 10 reps 2 sets;  marching to simulate SLS in midstance with knee buckling - 25# BLEs concentric & eccentric and isometric hold while lifting contralateral off plate -  5 reps LLE lead and 5 reps RLE lead;  single leg 25# 5 reps 2 sets ea LE.  Prosthetic Training: Pt amb 5' with RW with maxA +2 person assist.  PT verbal cues on sequence. PT recommended appt with prosthetist to have pads added to socket and do an alignment check.  Once pads are added, then she will need less socks depending on size & thickness of pads.  Pt & husband verbalized  understanding.     PATIENT EDUCATION: PATIENT EDUCATED ON FOLLOWING PROSTHETIC CARE: Education details: Use of stockinette under proximal liner to decrease itching sensation, no wounds presents to PT recommended not using Vive wear under liner Skin check, Prosthetic cleaning, Propper donning, and Proper wear schedule/adjustment Prosthetic wear tolerance: 3 hours 2x/day, 4 days/week Person educated: Patient and Spouse Education method: Explanation, Tactile cues, and Verbal cues Education comprehension: verbalized understanding, verbal cues required, tactile cues required, and needs further education  HOME EXERCISE PROGRAM: Access Code: [email or web address] URL: [email or web address] Date: 04/15/2023 Prepared by: Grayce Spatz  Exercises - Supine Bridge  - 1 x daily - 4 x weekly - 2 sets - 5 reps - 2 seconds hold - Supine Lower Trunk Rotation  - 1 x daily - 4 x weekly - 2 sets - 5 reps - 5 seconds hold - Supine Heel Slide with Strap  - 1 x daily - 4 x weekly - 2 sets - 5 reps - 2-3 seconds hold - Supine Hip Abduction  - 1 x daily - 4 x weekly - 2 sets - 5 reps - 2-3 seconds hold - Seated Hip Flexion Toward Target  -  1 x daily - 4 x weekly - 2 sets - 5 reps - 5 seconds hold - Seated Eccentric Abdominal Lean Back  - 1 x daily - 4 x weekly - 2 sets - 5 reps - 5 seconds hold - Seated Sidebending  - 1 x daily - 4 x weekly - 2 sets - 5 reps - 5 seconds hold - Seated Trunk Rotation with Crossed Arms  - 1 x daily - 4 x weekly - 2 sets - 5 reps - 5 seconds hold   ASSESSMENT:  CLINICAL IMPRESSION: She ambulated  further overall but does require a lot of assistance for safety, balance, and RW management. This should continue to improve with more practice and as she improves confidence.   OBJECTIVE IMPAIRMENTS: Abnormal gait, decreased activity tolerance, decreased balance, decreased endurance, decreased knowledge of condition, decreased knowledge of use of DME, decreased mobility, difficulty walking, decreased ROM, decreased strength, decreased safety awareness, postural dysfunction, prosthetic dependency , and pain.   ACTIVITY LIMITATIONS: standing, transfers, and locomotion level  PARTICIPATION LIMITATIONS: community activity, household mobility and dependency / burden of care on family  PERSONAL FACTORS: Age, Fitness, Past/current experiences, Time since onset of injury/illness/exacerbation, and 3+ comorbidities: see PMH  are also affecting patient's functional outcome.   REHAB POTENTIAL: Good  CLINICAL DECISION MAKING: Evolving/moderate complexity  EVALUATION COMPLEXITY: Moderate   GOALS: Goals reviewed with patient? Yes  SHORT TERM GOALS: Target date: 04/22/2023:  Patient donnes prosthesis correctly with husband's assist & verbalizes proper cleaning. Baseline: SEE OBJECTIVE DATA Goal status:   MET 04/21/2023 2.  Patient tolerates prosthesis wear on non-dailysis days >8 hrs total /day and on dialysis days >2 hours/day without skin issues or limb pain >5/10. Baseline: SEE OBJECTIVE DATA Goal status:  partially MET 04/21/2023  3.  Patient able to stand with rolling walker with 1 person modA for 1  min.  Baseline: SEE OBJECTIVE DATA Goal status: MET 04/21/2023  4. Patient ambulates 5' with RW & prosthesis with +2 maxA.  Baseline: SEE OBJECTIVE DATA Goal status: MET 04/21/2023  5. Patient sit to/from stand transfer with +1 maxA and scooting transfer with modA. Baseline: SEE OBJECTIVE DATA Goal status: MET 04/21/2023  LONG TERM GOALS: Target date: 06/17/2022  Patient & husband demonstrates & verbalizes understanding of prosthetic care to enable safe utilization of  prosthesis. Baseline: SEE OBJECTIVE DATA Goal status: Ongoing 06/01/2023  Patient tolerates prosthesis wear >80% of awake hours on non-dialysis days and >50% on dialysis days without skin issues and limb pain </= 4/10. Baseline: SEE OBJECTIVE DATA Goal status: Ongoing 06/01/2023  Scooting transfers with minA and sit to/from stand transfers with modA.  Baseline: SEE OBJECTIVE DATA Goal status: Ongoing 06/01/2023  Patient ambulates >10' with prosthesis & RW with modA.  Baseline: SEE OBJECTIVE DATA Goal status: Ongoing  06/01/2023  Patient stands with RW support with minA or less for 2 min.  Baseline: SEE OBJECTIVE DATA Goal status:   Ongoing 06/01/2023   PLAN:  PT FREQUENCY: 2x/week  PT DURATION: 12 weeks  PLANNED INTERVENTIONS: 97164- PT Re-evaluation, 97110-Therapeutic exercises, 97530- Therapeutic activity, 97112- Neuromuscular re-education, [id number]- Self Care, [id number]- Gait training, [phone]- Prosthetic training, Patient/Family education, Balance training, DME instructions, Therapeutic exercises, Therapeutic activity, Neuromuscular re-education, Gait training, and Self Care  PLAN FOR NEXT SESSION     continue Nustep.  Leg press.  Standing with RW support. Stand pivot transfer with RW. Gait short distances with RW using visual cues.   Try to add static stance with RW.    Redell JONELLE Moose, PT, DPT 06/08/2023, 2:30 PM

## 2023-06-09 ENCOUNTER — Encounter (HOSPITAL_COMMUNITY): Payer: PPO

## 2023-06-10 ENCOUNTER — Ambulatory Visit: Payer: PPO | Admitting: Physical Therapy

## 2023-06-10 ENCOUNTER — Encounter: Payer: Self-pay | Admitting: Physical Therapy

## 2023-06-10 DIAGNOSIS — G546 Phantom limb syndrome with pain: Secondary | ICD-10-CM | POA: Diagnosis not present

## 2023-06-10 DIAGNOSIS — M6281 Muscle weakness (generalized): Secondary | ICD-10-CM | POA: Diagnosis not present

## 2023-06-10 DIAGNOSIS — R2681 Unsteadiness on feet: Secondary | ICD-10-CM

## 2023-06-10 DIAGNOSIS — Z7409 Other reduced mobility: Secondary | ICD-10-CM | POA: Diagnosis not present

## 2023-06-10 DIAGNOSIS — Z89511 Acquired absence of right leg below knee: Secondary | ICD-10-CM

## 2023-06-10 DIAGNOSIS — R2689 Other abnormalities of gait and mobility: Secondary | ICD-10-CM

## 2023-06-10 NOTE — Therapy (Signed)
 OUTPATIENT PHYSICAL THERAPY PROSTHETIC TREATMENT   Patient Name: Susan [name] MRN: [id number] DOB:[birth year], 73 y.o., female Today's Date: 06/10/2023  PCP: Austine Lefort, MD  REFERRING PROVIDER: Timothy Ford, MD  END OF SESSION:  PT End of Session - 06/10/23 1348     Visit Number 18    Number of Visits 25    Date for PT Re-Evaluation 06/18/23    Authorization Type [employer]    Progress Note Due on Visit 20    PT Start Time 1345    PT Stop Time 1430    PT Time Calculation (min) 45 min    Equipment Utilized During Treatment Gait belt    Activity Tolerance Patient tolerated treatment well;Patient limited by fatigue    Behavior During Therapy WFL for tasks assessed/performed              Past Medical History:  Diagnosis Date   Anemia    hx   Anxiety    Arthritis    "generalized" (03/15/2014)   CAD (coronary artery disease)    MI in 2000 - MI  2007 - treated bare metal stent (no nuclear since then as 9/11)   Carotid artery disease (HCC)    Chronic diastolic heart failure (HCC)    a) ECHO (08/2013) EF 55-60% and RV function nl b) RHC (08/2013) RA 4, RV 30/5/7, PA 25/10 (16), PCWP 7, Fick CO/CI 6.3/2.7, PVR 1.5 WU, PA 61 and 66%   Daily headache    "~ every other day; since I fell in June" (03/15/2014)   Depression    Diabetic retinopathy (HCC)    Dyslipidemia    ESRD (end stage renal disease) (HCC)    Dialysis on Tues Thurs Sat   Exertional shortness of breath    GERD (gastroesophageal reflux disease)    History of blood transfusion    History of kidney stones    HTN (hypertension)    Hypothyroidism    Myocardial infarction (HCC)    Obesity    Osteoarthritis    PAF (paroxysmal atrial fibrillation) (HCC)    Peripheral neuropathy    bilateral feet/hands   PONV (postoperative nausea and vomiting)    RBBB (right bundle branch block)    Old   Stroke (HCC)    mini strokes   Type II diabetes mellitus (HCC)    Type II, [name] libre left  upper arm. patient has omnipod insulin  pump with Novolin R Insulin    Past Surgical History:  Procedure Laterality Date   A/V FISTULAGRAM Left 11/07/2022   Procedure: A/V Fistulagram;  Surgeon: Carlene Che, MD;  [facility]: [facility];  Service: Cardiovascular;  Laterality: Left;   ABDOMINAL HYSTERECTOMY  1980's   AMPUTATION Right 02/24/2018   Procedure: RIGHT FOOT GREAT TOE AND 2ND TOE AMPUTATION;  Surgeon: Timothy Ford, MD;  Location: [facility] OR;  Service: Orthopedics;  Laterality: Right;   AMPUTATION Right 04/30/2018   Procedure: RIGHT TRANSMETATARSAL AMPUTATION;  Surgeon: Timothy Ford, MD;  Location: [facility] OR;  Service: Orthopedics;  Laterality: Right;   AMPUTATION Right 05/02/2022   Procedure: RIGHT BELOW KNEE AMPUTATION;  Surgeon: Timothy Ford, MD;  Location: [facility] OR;  Service: Orthopedics;  Laterality: Right;   APPLICATION OF WOUND VAC Right 06/13/2022   Procedure: APPLICATION OF WOUND VAC;  Surgeon: Timothy Ford, MD;  Location: [facility] OR;  Service: Orthopedics;  Laterality: Right;   APPLICATION OF WOUND VAC Left 11/14/2022   Procedure: APPLICATION OF WOUND VAC;  Surgeon: Timothy Ford, MD;  Location: [facility] OR;  Service: Orthopedics;  Laterality: Left;   AV FISTULA PLACEMENT Left 04/02/2022   Procedure: LEFT ARM ARTERIOVENOUS (AV) FISTULA CREATION;  Surgeon: Margherita Shell, MD;  Location: [facility] OR;  Service: Vascular;  Laterality: Left;  PERIPHERAL NERVE BLOCK   AV FISTULA PLACEMENT Right 04/27/2023   Procedure: RIGHT ARM BRACHIOBASILIC ARTERIOVENOUS (AV) FISTULA CREATION;  Surgeon: Carlene Che, MD;  Location: [facility] OR;  Service: Vascular;  Laterality: Right;   BASCILIC VEIN TRANSPOSITION Left 07/31/2022   Procedure: LEFT ARM SECOND STAGE BASILIC VEIN TRANSPOSITION;  Surgeon: Margherita Shell, MD;  Location: [facility] OR;  Service: Vascular;  Laterality: Left;   BIOPSY  05/27/2020   Procedure: BIOPSY;  Surgeon: Vinetta Greening, DO;  Location: [facility];  Service: Endoscopy;;   CATARACT  EXTRACTION, BILATERAL Bilateral ?2013   COLONOSCOPY W/ POLYPECTOMY     COLONOSCOPY WITH PROPOFOL  N/A 03/13/2019   Procedure: COLONOSCOPY WITH PROPOFOL ;  Surgeon: Nannette Babe, MD;  Location: [facility] ENDOSCOPY;  Service: Gastroenterology;  Laterality: N/A;   CORONARY ANGIOPLASTY WITH STENT PLACEMENT  1999; 2007   "1 + 1"   ERCP N/A 02/03/2022   Procedure: ENDOSCOPIC RETROGRADE CHOLANGIOPANCREATOGRAPHY (ERCP);  Surgeon: Alvis Jourdain, MD;  Location: [facility] ENDOSCOPY;  Service: Gastroenterology;  Laterality: N/A;   ESOPHAGOGASTRODUODENOSCOPY N/A 02/12/2023   Procedure: ESOPHAGOGASTRODUODENOSCOPY (EGD);  Surgeon: Alvis Jourdain, MD;  Location: [facility] ENDOSCOPY;  Service: Gastroenterology;  Laterality: N/A;   ESOPHAGOGASTRODUODENOSCOPY (EGD) WITH PROPOFOL  N/A 03/13/2019   Procedure: ESOPHAGOGASTRODUODENOSCOPY (EGD) WITH PROPOFOL ;  Surgeon: Nannette Babe, MD;  [facility]OS[facility];  Service: Gastroenterology;  Laterality: N/A;   ESOPHAGOGASTRODUODENOSCOPY (EGD) WITH PROPOFOL  N/A 05/27/2020   Procedure: ESOPHAGOGASTRODUODENOSCOPY (EGD) WITH PROPOFOL ;  Surgeon: Vinetta Greening, DO;  [facility];  Service: Endoscopy;  Laterality: N/A;   ESOPHAGOGASTRODUODENOSCOPY (EGD) WITH PROPOFOL  N/A 09/03/2022   Procedure: ESOPHAGOGASTRODUODENOSCOPY (EGD) WITH PROPOFOL ;  Surgeon: Alvis Jourdain, MD;  Location: [facility] ENDOSCOPY;  Service: Gastroenterology;  Laterality: N/A;   EYE SURGERY Bilateral    lazer   FLEXIBLE SIGMOIDOSCOPY N/A 05/23/2022   Procedure: FLEXIBLE SIGMOIDOSCOPY;  Surgeon: Alvis Jourdain, MD;  Location: [name] ENDOSCOPY;  Service: Gastroenterology;  Laterality: N/A;   FLEXIBLE SIGMOIDOSCOPY N/A 05/24/2022   Procedure: FLEXIBLE SIGMOIDOSCOPY;  Surgeon: Daina Drum, MD;  Location: [facility] ENDOSCOPY;  Service: Gastroenterology;  Laterality: N/A;   FLEXIBLE SIGMOIDOSCOPY N/A 09/03/2022   Procedure: FLEXIBLE SIGMOIDOSCOPY;  Surgeon: Alvis Jourdain, MD;  Location: [facility] ENDOSCOPY;  Service: Gastroenterology;  Laterality:  N/A;   HEMOSTASIS CLIP PLACEMENT  03/13/2019   Procedure: HEMOSTASIS CLIP PLACEMENT;  Surgeon: Nannette Babe, MD;  Location: [facility] ENDOSCOPY;  Service: Gastroenterology;;   HEMOSTASIS CLIP PLACEMENT  05/23/2022   Procedure: HEMOSTASIS CLIP PLACEMENT;  Surgeon: Alvis Jourdain, MD;  Location: [facility] ENDOSCOPY;  Service: Gastroenterology;;   HEMOSTASIS CONTROL  05/24/2022   Procedure: HEMOSTASIS CONTROL;  Surgeon: Daina Drum, MD;  Location: [facility] ENDOSCOPY;  Service: Gastroenterology;;   HOT HEMOSTASIS N/A 05/23/2022   Procedure: HOT HEMOSTASIS (ARGON PLASMA COAGULATION/BICAP);  Surgeon: Alvis Jourdain, MD;  Location: [facility] ENDOSCOPY;  Service: Gastroenterology;  Laterality: N/A;   I & D EXTREMITY Left 05/05/2022   Procedure: IRRIGATION AND DEBRIDEMENT LEFT ARM AV FISTULA;  Surgeon: Young Hensen, MD;  Location: [facility] OR;  Service: Vascular;  Laterality: Left;   I & D EXTREMITY N/A 11/14/2022   Procedure: IRRIGATION AND DEBRIDEMENT OF LOWER EXTREMITY WOUND;  Surgeon: Timothy Ford, MD;  Location: [facility] OR;  Service: Orthopedics;  Laterality: N/A;   INSERTION OF DIALYSIS CATHETER Right 04/02/2022   Procedure: INSERTION OF TUNNELED DIALYSIS CATHETER;  Surgeon: Margherita Shell, MD;  Location: [facility] OR;  Service: Vascular;  Laterality: Right;   KNEE ARTHROSCOPY Left 10/25/2006   POLYPECTOMY  03/13/2019   Procedure: POLYPECTOMY;  Surgeon: Nannette Babe, MD;  Location[facility] ENDOSCOPY;  Service: Gastroenterology;;   REMOVAL OF STONES  02/03/2022   Procedure: REMOVAL OF STONES;  Surgeon: Alvis Jourdain, MD;  Location: [facility] ENDOSCOPY;  Service: Gastroenterology;;   REVISON OF ARTERIOVENOUS FISTULA Left 08/20/2022   Procedure: REVISON OF LEFT ARM ARTERIOVENOUS FISTULA;  Surgeon: Margherita Shell, MD;  Location: [facility] OR;  Service: Vascular;  Laterality: Left;   RIGHT HEART CATH N/A 07/24/2017   Procedure: RIGHT HEART CATH;  Surgeon: Mardell Shade, MD;  [facility];  Service: Cardiovascular;  Laterality: N/A;    RIGHT HEART CATHETERIZATION N/A 09/22/2013   Procedure: RIGHT HEART CATH;  Surgeon: Mardell Shade, MD;  Location: [facility] CATH LAB;  Service: Cardiovascular;  Laterality: N/A;   SHOULDER ARTHROSCOPY WITH OPEN ROTATOR CUFF REPAIR Right 03/14/2014   Procedure: RIGHT SHOULDER ARTHROSCOPY WITH BICEPS RELEASE, OPEN SUBSCAPULA REPAIR, OPEN SUPRASPINATUS REPAIR.;  Surgeon: Jasmine Mesi, MD;  Location: [facility] OR;  Service: Orthopedics;  Laterality: Right;   SPHINCTEROTOMY  02/03/2022   Procedure: SPHINCTEROTOMY;  Surgeon: Alvis Jourdain, MD;  Location: [facility] ENDOSCOPY;  Service: Gastroenterology;;   STUMP REVISION Right 06/13/2022   Procedure: REVISION RIGHT BELOW KNEE AMPUTATION;  Surgeon: Timothy Ford, MD;  Location: [facility] OR;  Service: Orthopedics;  Laterality: Right;   TEE WITHOUT CARDIOVERSION N/A 02/04/2022   Procedure: TRANSESOPHAGEAL ECHOCARDIOGRAM (TEE);  Surgeon: Mardell Shade, MD;  Location: [facility] ENDOSCOPY;  Service: Cardiovascular;  Laterality: N/A;   THROMBECTOMY W/ EMBOLECTOMY Left 08/20/2022   Procedure: THROMBECTOMY OF LEFT ARM ARTERIOVENOUS FISTULA;  Surgeon: Margherita Shell, MD;  Location: [facility] OR;  Service: Vascular;  Laterality: Left;   TOE AMPUTATION Right 02/24/2018   GREAT TOE AND 2ND TOE AMPUTATION   TUBAL LIGATION  1970's   Patient Active Problem List   Diagnosis Date Noted   Hyperosmolar hyperglycemic state (HHS) (HCC) 02/05/2023   Depression 01/29/2023   Nausea 01/24/2023   Intractable nausea and vomiting 01/20/2023   Proctitis 01/15/2023   Acute on chronic anemia 11/25/2022   Right below-knee amputee (HCC) 11/20/2022   Cellulitis of left lower extremity 11/14/2022   Maggot infestation 11/12/2022   Complicated wound infection 11/10/2022   Secondary hypercoagulable state (HCC) 09/04/2022   Right sided abdominal pain 08/31/2022   Constipation 06/07/2022   History of Clostridioides difficile colitis 06/06/2022   Below-knee amputation of right lower extremity (HCC)  06/06/2022   Diverticulitis 06/05/2022   Stercoral colitis 06/05/2022   C. 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) 04/07/2022   Allergy, unspecified, initial encounter 04/07/2022   Dependence on renal dialysis (HCC) 04/07/2022   Gout due to renal impairment, unspecified site  04/07/2022   Hypertensive heart and chronic kidney disease with heart failure and with stage 5 chronic kidney disease, or end stage renal disease (HCC) 04/07/2022   Personal history of transient ischemic attack (TIA), and cerebral infarction without residual deficits 04/07/2022   Renal osteodystrophy 04/07/2022   Venous stasis ulcer of right calf (HCC) 03/31/2022   Fistula, colovaginal 03/26/2022   Diarrhea 03/26/2022   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   Sepsis without acute organ dysfunction (HCC)    Bacteremia    Acute pancreatitis 02/01/2022   Abdominal pain 02/01/2022   SIRS (systemic inflammatory response syndrome) (HCC) 02/01/2022   Transaminitis 02/01/2022   History of anemia due to chronic kidney disease 02/01/2022   Paroxysmal atrial fibrillation (HCC) 02/01/2022   Uncontrolled type 2 diabetes mellitus with hyperglycemia, with long-term current use of insulin  (HCC) 01/14/2022   NSTEMI (non-ST elevated myocardial infarction) (HCC) 03/05/2021   Acute renal failure superimposed on stage 4 chronic kidney disease (HCC) 08/22/2020   Hypoalbuminemia  05/25/2020   GERD (gastroesophageal reflux disease) 05/25/2020   Pressure injury of skin 05/17/2020   Acute on chronic combined systolic and diastolic congestive heart failure (HCC) 03/07/2020   Type 2 diabetes mellitus with diabetic polyneuropathy, with long-term current use of insulin  (HCC) 03/07/2020   Obesity, Class III, BMI 40-49.9 (morbid obesity) (HCC) 03/07/2020   Common bile duct (CBD) obstruction 05/28/2019   Benign neoplasm of ascending colon    Benign neoplasm of transverse colon    Benign neoplasm of descending colon    Benign neoplasm of sigmoid colon    Gastric polyps    Hyperkalemia 03/11/2019   Prolonged QT interval 03/11/2019   Acute blood loss anemia 03/11/2019   Onychomycosis 06/21/2018   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)    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 (HCC)    PVD (peripheral vascular disease) (HCC) 10/26/2017   E-coli UTI 07/27/2017   Hypothyroidism 07/27/2017   AKI (acute kidney injury) (HCC)    PAH (pulmonary artery hypertension) (HCC)    Impaired ambulation 07/19/2017   Nausea & vomiting 07/15/2017   Leg cramps 02/27/2017   Peripheral edema 01/12/2017   Diabetic neuropathy (HCC) 11/12/2016   CKD (chronic kidney disease), stage IV (HCC) 10/24/2015   Anemia of chronic disease 10/03/2015   Historical diagnosis of generalized anxiety disorder 10/03/2015   Secondary insomnia 10/03/2015   Hyperglycemia due to diabetes mellitus (HCC) 06/07/2015   Non compliance with medical treatment 04/17/2014   Rotator cuff tear 03/14/2014   Obesity 09/23/2013   Chronic HFrEF (heart failure with reduced ejection fraction) (HCC) 06/03/2013   Hypotension 12/25/2012   Hyponatremia 12/25/2012   Urinary incontinence    MDD (major depressive disorder), recurrent episode, moderate (HCC) 11/12/2010   RBBB (right bundle branch block)    Wide-complex tachycardia    Coronary artery disease    Hyperlipemia 01/22/2009   Chronic  hypotension 01/22/2009    ONSET DATE: 03/09/2023 MD referral to PT  REFERRING DIAG: S88.111D (ICD-10-CM) - Below-knee amputation of right lower extremity, subsequent encounter   THERAPY DIAG:  Unsteadiness on feet  Muscle weakness (generalized)  Phantom limb syndrome with pain (HCC)  Other abnormalities of gait and mobility  Impaired functional mobility, balance, gait, and endurance  Right below-knee amputee (HCC)  Rationale for Evaluation and Treatment: Rehabilitation  SUBJECTIVE:   SUBJECTIVE STATEMENT: Feeling ok but being sick has made things harder for her to do as she is also tired.    PERTINENT HISTORY: Right TTA  05/02/22, Lymphedema, idiopathic chronic venous HTN with ulcer, depression, colitis, diverticulitis, ESRD, gout, NSTEMI, DM2, polyneuropathy, PVD, CAD, right bundle branch block, mini strokes  PAIN:  Are you having pain? No pain today.   PRECAUTIONS: Fall and Other: No BP LUE  WEIGHT BEARING RESTRICTIONS: No  FALLS: Has patient fallen in last 6 months? No  LIVING ENVIRONMENT: Lives with: lives with their spouse and 2 small dogs Lives in: mobile home Home Access: Ramped entrance Home layout: One level Stairs: Yes: External: 6-7 steps; can reach both Has following equipment at home: Single point cane, Environmental consultant - 2 wheeled, Environmental consultant - 4 wheeled, Wheelchair (manual), [employer], Grab bars, and Ramped entry  OCCUPATION:  retired  PLOF: prior to amputation for ~year used RW in home & community  PATIENT GOALS:  to use prosthesis to walk, get out w/c in & out of car, get to bathroom.   OBJECTIVE:  COGNITION: Overall cognitive status:  Eval on 03/23/2023:   Within functional limits for tasks assessed  POSTURE:  Eval on 03/23/2023:  rounded shoulders, forward head, increased thoracic kyphosis, flexed trunk , and weight shift left  LOWER EXTREMITY ROM: ROM Right eval Left eval  Hip flexion    Hip extension    Hip abduction    Hip adduction    Hip  internal rotation    Hip external rotation    Knee flexion    Knee extension    Ankle dorsiflexion    Ankle plantarflexion    Ankle inversion    Ankle eversion     (Blank rows = not tested)  LOWER EXTREMITY MMT:  MMT Right eval Left eval  Hip flexion 3-/5 3-/5  Hip extension 2/5 2/5  Hip abduction 2+/5 2+/5  Hip adduction    Hip internal rotation    Hip external rotation    Knee flexion 3-/5 3-/5  Knee extension 3-/5 3-/5  Ankle dorsiflexion    Ankle plantarflexion    Ankle inversion    Ankle eversion    At Evaluation all strength testing is grossly seated and functionally standing / gait. (Blank rows = not tested)  TRANSFERS: 05/04/2023: Squat pivot transfers with modA.  04/21/2023: Pt sit to stand w/c to //bar with one hand pushing on w/c and other hand on //bar with modA first time & MinA 2nd/ 3rd time.  Best is RUE on //bar & LUE on w/c.  Stand to sit reaching LUE to w/c with minA to control descent.   PT demo & verbal cues on sit to/from stand w/c to RW.  1st time modA 2 person. 2nd time maxA one person.  Stand to sit with minA and constant cueing. Scooting transfers with minA with w/c & mat direct contact & same ht.   Eval on 03/23/2023:  Sit to stand: Max A (two-person assist) from 22" w/c to rolling walker Stand to sit: Max A (two-person assist) rolling walker to wheelchair  FUNCTIONAL TESTs:   04/21/2023: Pt able to stand 60 sec with BUE on //bars with supervision.  Pt able to stand for 1 min with RW support with minA.  Eval on 03/23/2023: Patient maintained upright holding rolling walker with mod assist 2 people for safety for 30 seconds.  GAIT: 04/21/2023: Pt amb 5' in //bars with modA (2 person for safety) with PT verbal & tactile cues on sequence and weight shift.  Pt amb 10' with RW with maxA (2 person for safety) with PT verbal & tactile cues on sequence and weight shift.  Eval on 03/23/2023:  Patient took 2 steps (one per LE) with +2 max assist  with rolling walker and TTA prosthesis.  Patient adducting prosthesis stepping on left foot with no awareness.  CURRENT PROSTHETIC WEAR ASSESSMENT:  Eval on 03/23/2023:   Patient is dependent with: skin check, residual limb care, prosthetic cleaning, ply sock cleaning, correct ply sock adjustment, proper wear schedule/adjustment, and proper weight-bearing schedule/adjustment Donning prosthesis: Max A Doffing prosthesis: Min A Prosthetic wear tolerance: 2-6 hours, 1x/day, 3-4 days/week Prosthetic weight bearing tolerance: 3 minutes with limb pain Edema: pitting edema Residual limb condition: No open areas, dry skin, normal color and temperature, cylindrical shape Prosthetic description: Silicone liner with pin lock suspension, total contact socket with flexible inner socket, sach foot    TODAY'S TREATMENT:                                                                                                                             DATE: 06/10/2023 Theract: Stand pivot R/L with RW and ModA to Nustep with vc for LE placement and sequencing; Requires demo of ambulation and side stepping with RW STS with RW require demo, vc, and tc   Therex: Nustep level 4 with BLE & BUEs use then BLEs only level 3  Prosthetic training: 73ft ambulation to chair 90* turn to position to sit with modAx2 for patient safety; no chair follow; visual cues on RW for proper LE placement; vc and steering/positioning of walker throughout 16ft ambulation 90* turn to orient to path and 90* to position to sit with modAx2 to wheelchair; visual cues on RW for proper foot placement  TREATMENT:                                                                                                                             DATE:06/03/2023:  Therapeutic Activities: Stand pivot transfer with RW  w/c to Nustep going to her left with modA (2nd person for safety) & Nustep to w/c going to her right with modA.  RUE contact assist on RW.  PT demo & verbal cues on technique & set-up  Therapeutic Exercise: Nustep seat 9 level 5 with LUE & BLEs for 4 min, then Level 3 with BLEs only for 4 min.  Prosthetic Training: Pt amb 5' X 2 with RW with maxA +2 person assist. 1st walk pt crossed prosthesis over LLE and could not correct so PT sat  pt in w/c.  Pt is very anxious and needed PT cues to control sequence and timing. PT used theraband on RW for visual cues for foot placement.  Sat on 24" bar stool with BUEs on RW practiced stepping feet forward behind visual target on RW for 1 minute.  Pt amb 7' with RW with MaxA +2 person assist with PT giving slow 1 step commands for sequence.  Pt improved last gait.     TREATMENT:                                                                                                                             DATE:  06/01/2023: Therapeutic Activities: Stand pivot transfer with RW  w/c to Nustep going to her left with modA (2nd person for safety) & Nustep to w/c going to her right with modA.  RUE contact assist on RW. PT demo & verbal cues on technique & set-up Squat pivot transfer leg press to w/c with minA.  Therapeutic Exercise: Nustep seat 9 level 5 with LUE & BLEs for 5 min, then Level 3 with BLEs only for 3 min. Leg press BLEs 62# 10 reps 2 sets;  marching to simulate SLS in midstance with knee buckling - 37# BLEs concentric & eccentric and isometric hold while lifting contralateral off plate -  5 reps LLE lead and 5 reps RLE lead;   Prosthetic Training: Pt amb 5' with RW with maxA +2 person assist.  PT verbal cues on sequence.   PATIENT EDUCATION: PATIENT EDUCATED ON FOLLOWING PROSTHETIC CARE: Education details: Use of stockinette under proximal liner to decrease itching sensation, no wounds presents to PT recommended not using Vive wear under liner Skin check, Prosthetic cleaning, Propper donning, and Proper wear schedule/adjustment Prosthetic wear tolerance: 3 hours 2x/day, 4 days/week Person  educated: Patient and Spouse Education method: Explanation, Tactile cues, and Verbal cues Education comprehension: verbalized understanding, verbal cues required, tactile cues required, and needs further education  HOME EXERCISE PROGRAM: Access Code: [email or web address] URL: [email or web address] Date: 04/15/2023 Prepared by: Lorie Rook  Exercises - Supine Bridge  - 1 x daily - 4 x weekly - 2 sets - 5 reps - 2 seconds hold - Supine Lower Trunk Rotation  - 1 x daily - 4 x weekly - 2 sets - 5 reps - 5 seconds hold - Supine Heel Slide with Strap  - 1 x daily - 4 x weekly - 2 sets - 5 reps - 2-3 seconds hold - Supine Hip Abduction  - 1 x daily - 4 x weekly - 2 sets - 5 reps - 2-3 seconds hold - Seated Hip Flexion Toward Target  - 1 x daily - 4 x weekly - 2 sets - 5 reps - 5 seconds hold - Seated Eccentric Abdominal Lean Back  - 1 x daily - 4 x weekly - 2 sets - 5 reps - 5 seconds hold - Seated Sidebending  - 1 x daily - 4  x weekly - 2 sets - 5 reps - 5 seconds hold - Seated Trunk Rotation with Crossed Arms  - 1 x daily - 4 x weekly - 2 sets - 5 reps - 5 seconds hold   ASSESSMENT:  CLINICAL IMPRESSION: Patient requires moderate assist for transfers at times requiring heavy assist when rising to stand due to unsteadiness with walker. Patient was able to complete 1 bout of ambulation with RW 50ft with modA and 1 bout of ambulation 60ft with ModA requires demonstrations of ambulation and sequencing each time. Patient is highly fearful of falling and does best when reassured and provided explanations of activities additional times before attempts.   OBJECTIVE IMPAIRMENTS: Abnormal gait, decreased activity tolerance, decreased balance, decreased endurance, decreased knowledge of condition, decreased knowledge of use of DME, decreased mobility, difficulty walking, decreased ROM, decreased strength, decreased safety awareness, postural dysfunction, prosthetic dependency , and pain.   ACTIVITY  LIMITATIONS: standing, transfers, and locomotion level  PARTICIPATION LIMITATIONS: community activity, household mobility and dependency / burden of care on family  PERSONAL FACTORS: Age, Fitness, Past/current experiences, Time since onset of injury/illness/exacerbation, and 3+ comorbidities: see PMH  are also affecting patient's functional outcome.   REHAB POTENTIAL: Good  CLINICAL DECISION MAKING: Evolving/moderate complexity  EVALUATION COMPLEXITY: Moderate   GOALS: Goals reviewed with patient? Yes  SHORT TERM GOALS: Target date: 04/22/2023:  Patient donnes prosthesis correctly with husband's assist & verbalizes proper cleaning. Baseline: SEE OBJECTIVE DATA Goal status:   MET 04/21/2023 2.  Patient tolerates prosthesis wear on non-dailysis days >8 hrs total /day and on dialysis days >2 hours/day without skin issues or limb pain >5/10. Baseline: SEE OBJECTIVE DATA Goal status:  partially MET 04/21/2023  3.  Patient able to stand with rolling walker with 1 person modA for 1 min.  Baseline: SEE OBJECTIVE DATA Goal status: MET 04/21/2023  4. Patient ambulates 5' with RW & prosthesis with +2 maxA.  Baseline: SEE OBJECTIVE DATA Goal status: MET 04/21/2023  5. Patient sit to/from stand transfer with +1 maxA and scooting transfer with modA. Baseline: SEE OBJECTIVE DATA Goal status: MET 04/21/2023  LONG TERM GOALS: Target date: 06/17/2022  Patient & husband demonstrates & verbalizes understanding of prosthetic care to enable safe utilization of prosthesis. Baseline: SEE OBJECTIVE DATA Goal status: Ongoing 06/01/2023  Patient tolerates prosthesis wear >80% of awake hours on non-dialysis days and >50% on dialysis days without skin issues and limb pain </= 4/10. Baseline: SEE OBJECTIVE DATA Goal status: Ongoing 06/01/2023  Scooting transfers with minA and sit to/from stand transfers with modA.  Baseline: SEE OBJECTIVE DATA Goal status: Ongoing 06/01/2023  Patient ambulates >10'  with prosthesis & RW with modA.  Baseline: SEE OBJECTIVE DATA Goal status: Ongoing  06/01/2023  Patient stands with RW support with minA or less for 2 min.  Baseline: SEE OBJECTIVE DATA Goal status:   Ongoing 06/01/2023   PLAN:  PT FREQUENCY: 2x/week  PT DURATION: 12 weeks  PLANNED INTERVENTIONS: 97164- PT Re-evaluation, 97110-Therapeutic exercises, 97530- Therapeutic activity, 97112- Neuromuscular re-education, [phone]- Self Care, 30865- Gait training, [phone]- Prosthetic training, Patient/Family education, Balance training, DME instructions, Therapeutic exercises, Therapeutic activity, Neuromuscular re-education, Gait training, and Self Care  PLAN FOR NEXT SESSION: Check LTGs next week with plan to recertify for further PT,  Continue Nustep for endurance, short distance ambulation with RW using visual cues on walker, dynamic standing balance for tolerance to don/doff clothing.   Tai Skelly, Safal Halderman, Student-PT 06/10/2023, 2:35 PM   During this evaluation / treatment session,  the Physical Therapist/Physical Therapist Assistant was present and participating in the session. This entire session was performed under direct supervision and direction of a licensed therapist/therapist assistant . I have personally read, edited and approve of the note as written.   Lorie Rook, PT, DPT 06/10/2023, 4:57 PM

## 2023-06-15 ENCOUNTER — Ambulatory Visit: Payer: PPO | Admitting: Physical Therapy

## 2023-06-15 ENCOUNTER — Ambulatory Visit: Payer: Self-pay | Admitting: Family Medicine

## 2023-06-15 ENCOUNTER — Encounter: Payer: Self-pay | Admitting: Physical Therapy

## 2023-06-15 DIAGNOSIS — M6281 Muscle weakness (generalized): Secondary | ICD-10-CM | POA: Diagnosis not present

## 2023-06-15 DIAGNOSIS — Z89511 Acquired absence of right leg below knee: Secondary | ICD-10-CM | POA: Diagnosis not present

## 2023-06-15 DIAGNOSIS — G546 Phantom limb syndrome with pain: Secondary | ICD-10-CM

## 2023-06-15 DIAGNOSIS — R2689 Other abnormalities of gait and mobility: Secondary | ICD-10-CM

## 2023-06-15 DIAGNOSIS — R2681 Unsteadiness on feet: Secondary | ICD-10-CM

## 2023-06-15 DIAGNOSIS — Z7409 Other reduced mobility: Secondary | ICD-10-CM | POA: Diagnosis not present

## 2023-06-15 NOTE — Therapy (Addendum)
OUTPATIENT PHYSICAL THERAPY PROSTHETIC TREATMENT   Patient Name: Susan [name] MRN: [id number] DOB:05/11/1951, 73 y.o., female Today's Date: 06/15/2023  PCP: Donita Brooks, MD  REFERRING PROVIDER: Nadara Mustard, MD  END OF SESSION:  PT End of Session - 06/15/23 1449     Visit Number 19    Number of Visits 25    Date for PT Re-Evaluation 06/18/23    Authorization Type [employer]    Progress Note Due on Visit 20    PT Start Time 1345    PT Stop Time 1430    PT Time Calculation (min) 45 min    Equipment Utilized During Treatment Gait belt    Activity Tolerance Patient tolerated treatment well;Patient limited by fatigue    Behavior During Therapy WFL for tasks assessed/performed               Past Medical History:  Diagnosis Date   Anemia    hx   Anxiety    Arthritis    "generalized" (03/15/2014)   CAD (coronary artery disease)    MI in 2000 - MI  2007 - treated bare metal stent (no nuclear since then as 9/11)   Carotid artery disease (HCC)    Chronic diastolic heart failure (HCC)    a) ECHO (08/2013) EF 55-60% and RV function nl b) RHC (08/2013) RA 4, RV 30/5/7, PA 25/10 (16), PCWP 7, Fick CO/CI 6.3/2.7, PVR 1.5 WU, PA 61 and 66%   Daily headache    "~ every other day; since I fell in June" (03/15/2014)   Depression    Diabetic retinopathy (HCC)    Dyslipidemia    ESRD (end stage renal disease) (HCC)    Dialysis on Tues Thurs Sat   Exertional shortness of breath    GERD (gastroesophageal reflux disease)    History of blood transfusion    History of kidney stones    HTN (hypertension)    Hypothyroidism    Myocardial infarction (HCC)    Obesity    Osteoarthritis    PAF (paroxysmal atrial fibrillation) (HCC)    Peripheral neuropathy    bilateral feet/hands   PONV (postoperative nausea and vomiting)    RBBB (right bundle branch block)    Old   Stroke (HCC)    mini strokes   Type II diabetes mellitus (HCC)    Type II, [name] left  upper arm. patient has omnipod insulin pump with Novolin R Insulin   Past Surgical History:  Procedure Laterality Date   A/V FISTULAGRAM Left 11/07/2022   Procedure: A/V Fistulagram;  Surgeon: Leonie Douglas, MD;  [facility]: [facility];  Service: Cardiovascular;  Laterality: Left;   ABDOMINAL HYSTERECTOMY  1980's   AMPUTATION Right 02/24/2018   Procedure: RIGHT FOOT GREAT TOE AND 2ND TOE AMPUTATION;  Surgeon: Nadara Mustard, MD;  Location: [facility] OR;  Service: Orthopedics;  Laterality: Right;   AMPUTATION Right 04/30/2018   Procedure: RIGHT TRANSMETATARSAL AMPUTATION;  Surgeon: Nadara Mustard, MD;  Location: [facility] OR;  Service: Orthopedics;  Laterality: Right;   AMPUTATION Right 05/02/2022   Procedure: RIGHT BELOW KNEE AMPUTATION;  Surgeon: Nadara Mustard, MD;  Location: [facility] OR;  Service: Orthopedics;  Laterality: Right;   APPLICATION OF WOUND VAC Right 06/13/2022   Procedure: APPLICATION OF WOUND VAC;  Surgeon: Nadara Mustard, MD;  Location: [facility] OR;  Service: Orthopedics;  Laterality: Right;   APPLICATION OF WOUND VAC Left 11/14/2022   Procedure: APPLICATION OF WOUND VAC;  Surgeon: Nadara Mustard, MD;  Location: [facility] OR;  Service: Orthopedics;  Laterality: Left;   AV FISTULA PLACEMENT Left 04/02/2022   Procedure: LEFT ARM ARTERIOVENOUS (AV) FISTULA CREATION;  Surgeon: Nada Libman, MD;  Location: [facility] OR;  Service: Vascular;  Laterality: Left;  PERIPHERAL NERVE BLOCK   AV FISTULA PLACEMENT Right 04/27/2023   Procedure: RIGHT ARM BRACHIOBASILIC ARTERIOVENOUS (AV) FISTULA CREATION;  Surgeon: Leonie Douglas, MD;  Location: [facility] OR;  Service: Vascular;  Laterality: Right;   BASCILIC VEIN TRANSPOSITION Left 07/31/2022   Procedure: LEFT ARM SECOND STAGE BASILIC VEIN TRANSPOSITION;  Surgeon: Nada Libman, MD;  [facility]: [facility] OR;  Service: Vascular;  Laterality: Left;   BIOPSY  05/27/2020   Procedure: BIOPSY;  Surgeon: Lanelle Bal, DO;  [facility]: [facility];  Service: Endoscopy;;   CATARACT  EXTRACTION, BILATERAL Bilateral ?2013   COLONOSCOPY W/ POLYPECTOMY     COLONOSCOPY WITH PROPOFOL N/A 03/13/2019   Procedure: COLONOSCOPY WITH PROPOFOL;  Surgeon: Beverley Fiedler, MD;  Location: [facility] ENDOSCOPY;  Service: Gastroenterology;  Laterality: N/A;   CORONARY ANGIOPLASTY WITH STENT PLACEMENT  1999; 2007   "1 + 1"   ERCP N/A 02/03/2022   Procedure: ENDOSCOPIC RETROGRADE CHOLANGIOPANCREATOGRAPHY (ERCP);  Surgeon: Jeani Hawking, MD;  Location: [facility] ENDOSCOPY;  Service: Gastroenterology;  Laterality: N/A;   ESOPHAGOGASTRODUODENOSCOPY N/A 02/12/2023   Procedure: ESOPHAGOGASTRODUODENOSCOPY (EGD);  Surgeon: Jeani Hawking, MD;  Location: [facility] ENDOSCOPY;  Service: Gastroenterology;  Laterality: N/A;   ESOPHAGOGASTRODUODENOSCOPY (EGD) WITH PROPOFOL N/A 03/13/2019   Procedure: ESOPHAGOGASTRODUODENOSCOPY (EGD) WITH PROPOFOL;  Surgeon: Beverley Fiedler, MD;  Location: [facility] ENDOSCOPY;  Service: Gastroenterology;  Laterality: N/A;   ESOPHAGOGASTRODUODENOSCOPY (EGD) WITH PROPOFOL N/A 05/27/2020   Procedure: ESOPHAGOGASTRODUODENOSCOPY (EGD) WITH PROPOFOL;  Surgeon: Lanelle Bal, DO;  [facility];  Service: Endoscopy;  Laterality: N/A;   ESOPHAGOGASTRODUODENOSCOPY (EGD) WITH PROPOFOL N/A 09/03/2022   Procedure: ESOPHAGOGASTRODUODENOSCOPY (EGD) WITH PROPOFOL;  Surgeon: Jeani Hawking, MD;  Location: [facility] ENDOSCOPY;  Service: Gastroenterology;  Laterality: N/A;   EYE SURGERY Bilateral    lazer   FLEXIBLE SIGMOIDOSCOPY N/A 05/23/2022   Procedure: FLEXIBLE SIGMOIDOSCOPY;  Surgeon: Jeani Hawking, MD;  Location: [facility] ENDOSCOPY;  Service: Gastroenterology;  Laterality: N/A;   FLEXIBLE SIGMOIDOSCOPY N/A 05/24/2022   Procedure: FLEXIBLE SIGMOIDOSCOPY;  Surgeon: Imogene Burn, MD;  Location: [facility] ENDOSCOPY;  Service: Gastroenterology;  Laterality: N/A;   FLEXIBLE SIGMOIDOSCOPY N/A 09/03/2022   Procedure: FLEXIBLE SIGMOIDOSCOPY;  Surgeon: Jeani Hawking, MD;  Location: [facility] ENDOSCOPY;  Service: Gastroenterology;  Laterality:  N/A;   HEMOSTASIS CLIP PLACEMENT  03/13/2019   Procedure: HEMOSTASIS CLIP PLACEMENT;  Surgeon: Beverley Fiedler, MD;  Location: [facility] ENDOSCOPY;  Service: Gastroenterology;;   HEMOSTASIS CLIP PLACEMENT  05/23/2022   Procedure: HEMOSTASIS CLIP PLACEMENT;  Surgeon: Jeani Hawking, MD;  Location: [facility] ENDOSCOPY;  Service: Gastroenterology;;   HEMOSTASIS CONTROL  05/24/2022   Procedure: HEMOSTASIS CONTROL;  Surgeon: Imogene Burn, MD;  Location: [facility] ENDOSCOPY;  Service: Gastroenterology;;   HOT HEMOSTASIS N/A 05/23/2022   Procedure: HOT HEMOSTASIS (ARGON PLASMA COAGULATION/BICAP);  Surgeon: Jeani Hawking, MD;  Location: [facility] ENDOSCOPY;  Service: Gastroenterology;  Laterality: N/A;   I & D EXTREMITY Left 05/05/2022   Procedure: IRRIGATION AND DEBRIDEMENT LEFT ARM AV FISTULA;  Surgeon: Cephus Shelling, MD;  Location: [facility] OR;  Service: Vascular;  Laterality: Left;   I & D EXTREMITY N/A 11/14/2022   Procedure: IRRIGATION AND DEBRIDEMENT OF LOWER EXTREMITY WOUND;  Surgeon: Nadara Mustard, MD;  Location: [facility] OR;  Service: Orthopedics;  Laterality: N/A;   INSERTION OF DIALYSIS CATHETER Right 04/02/2022   Procedure: INSERTION OF TUNNELED DIALYSIS CATHETER;  Surgeon: Nada Libman, MD;  Location: [facility] OR;  Service: Vascular;  Laterality: Right;   KNEE ARTHROSCOPY Left 10/25/2006   POLYPECTOMY  03/13/2019   Procedure: POLYPECTOMY;  Surgeon: Beverley Fiedler, MD;  Location[facility] ENDOS[facility];  Service: Gastroenterology;;   REMOVAL OF STONES  02/03/2022   Procedure: REMOVAL OF STONES;  Surgeon: Jeani Hawking, MD;  Location: [facility] ENDOSCOPY;  Service: Gastroenterology;;   REVISON OF ARTERIOVENOUS FISTULA Left 08/20/2022   Procedure: REVISON OF LEFT ARM ARTERIOVENOUS FISTULA;  Surgeon: Nada Libman, MD;  Location: [facility] OR;  Service: Vascular;  Laterality: Left;   RIGHT HEART CATH N/A 07/24/2017   Procedure: RIGHT HEART CATH;  Surgeon: Dolores Patty, MD;  [facility];  Service: Cardiovascular;  Laterality: N/A;    RIGHT HEART CATHETERIZATION N/A 09/22/2013   Procedure: RIGHT HEART CATH;  Surgeon: Dolores Patty, MD;  Location: [facility] CATH LAB;  Service: Cardiovascular;  Laterality: N/A;   SHOULDER ARTHROSCOPY WITH OPEN ROTATOR CUFF REPAIR Right 03/14/2014   Procedure: RIGHT SHOULDER ARTHROSCOPY WITH BICEPS RELEASE, OPEN SUBSCAPULA REPAIR, OPEN SUPRASPINATUS REPAIR.;  Surgeon: Cammy Copa, MD;  Location: [facility] OR;  Service: Orthopedics;  Laterality: Right;   SPHINCTEROTOMY  02/03/2022   Procedure: SPHINCTEROTOMY;  Surgeon: Jeani Hawking, MD;  Location: [facility] ENDOSCOPY;  Service: Gastroenterology;;   STUMP REVISION Right 06/13/2022   Procedure: REVISION RIGHT BELOW KNEE AMPUTATION;  Surgeon: Nadara Mustard, MD;  Location: [facility] OR;  Service: Orthopedics;  Laterality: Right;   TEE WITHOUT CARDIOVERSION N/A 02/04/2022   Procedure: TRANSESOPHAGEAL ECHOCARDIOGRAM (TEE);  Surgeon: Dolores Patty, MD;  Location: [facility] ENDOSCOPY;  Service: Cardiovascular;  Laterality: N/A;   THROMBECTOMY W/ EMBOLECTOMY Left 08/20/2022   Procedure: THROMBECTOMY OF LEFT ARM ARTERIOVENOUS FISTULA;  Surgeon: Nada Libman, MD;  Location: [facility] OR;  Service: Vascular;  Laterality: Left;   TOE AMPUTATION Right 02/24/2018   GREAT TOE AND 2ND TOE AMPUTATION   TUBAL LIGATION  1970's   Patient Active Problem List   Diagnosis Date Noted   Hyperosmolar hyperglycemic state (HHS) (HCC) 02/05/2023   Depression 01/29/2023   Nausea 01/24/2023   Intractable nausea and vomiting 01/20/2023   Proctitis 01/15/2023   Acute on chronic anemia 11/25/2022   Right below-knee amputee (HCC) 11/20/2022   Cellulitis of left lower extremity 11/14/2022   Maggot infestation 11/12/2022   Complicated wound infection 11/10/2022   Secondary hypercoagulable state (HCC) 09/04/2022   Right sided abdominal pain 08/31/2022   Constipation 06/07/2022   History of Clostridioides difficile colitis 06/06/2022   Below-knee amputation of right lower extremity (HCC)  06/06/2022   Diverticulitis 06/05/2022   Stercoral colitis 06/05/2022   C. 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) 04/07/2022   Allergy, unspecified, initial encounter 04/07/2022   Dependence on renal dialysis (HCC) 04/07/2022   Gout due to renal impairment, unspecified site  04/07/2022   Hypertensive heart and chronic kidney disease with heart failure and with stage 5 chronic kidney disease, or end stage renal disease (HCC) 04/07/2022   Personal history of transient ischemic attack (TIA), and cerebral infarction without residual deficits 04/07/2022   Renal osteodystrophy 04/07/2022   Venous stasis ulcer of right calf (HCC) 03/31/2022   Fistula, colovaginal 03/26/2022   Diarrhea 03/26/2022   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   Sepsis without acute organ dysfunction (HCC)    Bacteremia    Acute pancreatitis 02/01/2022   Abdominal pain 02/01/2022   SIRS (systemic inflammatory response syndrome) (HCC) 02/01/2022   Transaminitis 02/01/2022   History of anemia due to chronic kidney disease 02/01/2022   Paroxysmal atrial fibrillation (HCC) 02/01/2022   Uncontrolled type 2 diabetes mellitus with hyperglycemia, with long-term current use of insulin (HCC) 01/14/2022   NSTEMI (non-ST elevated myocardial infarction) (HCC) 03/05/2021   Acute renal failure superimposed on stage 4 chronic kidney disease (HCC) 08/22/2020   Hypoalbuminemia  05/25/2020   GERD (gastroesophageal reflux disease) 05/25/2020   Pressure injury of skin 05/17/2020   Acute on chronic combined systolic and diastolic congestive heart failure (HCC) 03/07/2020   Type 2 diabetes mellitus with diabetic polyneuropathy, with long-term current use of insulin (HCC) 03/07/2020   Obesity, Class III, BMI 40-49.9 (morbid obesity) (HCC) 03/07/2020   Common bile duct (CBD) obstruction 05/28/2019   Benign neoplasm of ascending colon    Benign neoplasm of transverse colon    Benign neoplasm of descending colon    Benign neoplasm of sigmoid colon    Gastric polyps    Hyperkalemia 03/11/2019   Prolonged QT interval 03/11/2019   Acute blood loss anemia 03/11/2019   Onychomycosis 06/21/2018   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)    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 (HCC)    PVD (peripheral vascular disease) (HCC) 10/26/2017   E-coli UTI 07/27/2017   Hypothyroidism 07/27/2017   AKI (acute kidney injury) (HCC)    PAH (pulmonary artery hypertension) (HCC)    Impaired ambulation 07/19/2017   Nausea & vomiting 07/15/2017   Leg cramps 02/27/2017   Peripheral edema 01/12/2017   Diabetic neuropathy (HCC) 11/12/2016   CKD (chronic kidney disease), stage IV (HCC) 10/24/2015   Anemia of chronic disease 10/03/2015   Historical diagnosis of generalized anxiety disorder 10/03/2015   Secondary insomnia 10/03/2015   Hyperglycemia due to diabetes mellitus (HCC) 06/07/2015   Non compliance with medical treatment 04/17/2014   Rotator cuff tear 03/14/2014   Obesity 09/23/2013   Chronic HFrEF (heart failure with reduced ejection fraction) (HCC) 06/03/2013   Hypotension 12/25/2012   Hyponatremia 12/25/2012   Urinary incontinence    MDD (major depressive disorder), recurrent episode, moderate (HCC) 11/12/2010   RBBB (right bundle branch block)    Wide-complex tachycardia    Coronary artery disease    Hyperlipemia 01/22/2009   Chronic  hypotension 01/22/2009    ONSET DATE: 03/09/2023 MD referral to PT  REFERRING DIAG: S88.111D (ICD-10-CM) - Below-knee amputation of right lower extremity, subsequent encounter   THERAPY DIAG:  Unsteadiness on feet  Muscle weakness (generalized)  Phantom limb syndrome with pain (HCC)  Other abnormalities of gait and mobility  Impaired functional mobility, balance, gait, and endurance  Right below-knee amputee (HCC)  Rationale for Evaluation and Treatment: Rehabilitation  SUBJECTIVE:   SUBJECTIVE STATEMENT: Patient reports she is improving but feels like she needs further PT.     PERTINENT HISTORY: Right TTA 05/02/22, Lymphedema, idiopathic chronic venous  HTN with ulcer, depression, colitis, diverticulitis, ESRD, gout, NSTEMI, DM2, polyneuropathy, PVD, CAD, right bundle branch block, mini strokes  PAIN:  Are you having pain? No pain today.   PRECAUTIONS: Fall and Other: No BP LUE  WEIGHT BEARING RESTRICTIONS: No  FALLS: Has patient fallen in last 6 months? No  LIVING ENVIRONMENT: Lives with: lives with their spouse and 2 small dogs Lives in: mobile home Home Access: Ramped entrance Home layout: One level Stairs: Yes: External: 6-7 steps; can reach both Has following equipment at home: Single point cane, Environmental consultant - 2 wheeled, Environmental consultant - 4 wheeled, Wheelchair (manual), [employer], Grab bars, and Ramped entry  OCCUPATION:  retired  PLOF: prior to amputation for ~year used RW in home & community  PATIENT GOALS:  to use prosthesis to walk, get out w/c in & out of car, get to bathroom.   OBJECTIVE:  COGNITION: Overall cognitive status:  Eval on 03/23/2023:   Within functional limits for tasks assessed  POSTURE:  Eval on 03/23/2023:  rounded shoulders, forward head, increased thoracic kyphosis, flexed trunk , and weight shift left  LOWER EXTREMITY ROM: ROM Right eval Left eval  Hip flexion    Hip extension    Hip abduction    Hip adduction    Hip internal  rotation    Hip external rotation    Knee flexion    Knee extension    Ankle dorsiflexion    Ankle plantarflexion    Ankle inversion    Ankle eversion     (Blank rows = not tested)  LOWER EXTREMITY MMT:  MMT Right eval Left eval  Hip flexion 3-/5 3-/5  Hip extension 2/5 2/5  Hip abduction 2+/5 2+/5  Hip adduction    Hip internal rotation    Hip external rotation    Knee flexion 3-/5 3-/5  Knee extension 3-/5 3-/5  Ankle dorsiflexion    Ankle plantarflexion    Ankle inversion    Ankle eversion    At Evaluation all strength testing is grossly seated and functionally standing / gait. (Blank rows = not tested)  TRANSFERS: 06/15/2023: -Sit to stand transfers with RW wheelchair<>mat table at varied height: 19in with maxA to rise to stand; 22in ModA rise to stand, 24in min-modA rise to stand. -stand pivot transfer with RW w/c to mat table with modA once standing.  -Lateral scooting wc<>table at level height SBA; vc for LE positioning for optimal use of LE during transfer -Static stance for standing with RW; requiring Mod-MaxA for rise to stand d/t patient unsteady on feet posterior lean without proper righting strategy, SBA for stand  -lateral scoot R: modA due to fatigue and uneven surface transfer from low surface to higher surface   05/04/2023: Squat pivot transfers with modA.  04/21/2023: Pt sit to stand w/c to //bar with one hand pushing on w/c and other hand on //bar with modA first time & MinA 2nd/ 3rd time.  Best is RUE on //bar & LUE on w/c.  Stand to sit reaching LUE to w/c with minA to control descent.   PT demo & verbal cues on sit to/from stand w/c to RW.  1st time modA 2 person. 2nd time maxA one person.  Stand to sit with minA and constant cueing. Scooting transfers with minA with w/c & mat direct contact & same ht.   Eval on 03/23/2023:  Sit to stand: Max A (two-person assist) from 22" w/c to rolling walker Stand to sit: Max A (two-person  assist)  rolling walker to wheelchair  FUNCTIONAL TESTs:   04/21/2023: Pt able to stand 60 sec with BUE on //bars with supervision.  Pt able to stand for 1 min with RW support with minA.  Eval on 03/23/2023: Patient maintained upright holding rolling walker with mod assist 2 people for safety for 30 seconds.  GAIT: 04/21/2023: Pt amb 5' in //bars with modA (2 person for safety) with PT verbal & tactile cues on sequence and weight shift.  Pt amb 10' with RW with maxA (2 person for safety) with PT verbal & tactile cues on sequence and weight shift.  Eval on 03/23/2023:  Patient took 2 steps (one per LE) with +2 max assist with rolling walker and TTA prosthesis.  Patient adducting prosthesis stepping on left foot with no awareness.  CURRENT PROSTHETIC WEAR ASSESSMENT:  Eval on 03/23/2023:   Patient is dependent with: skin check, residual limb care, prosthetic cleaning, ply sock cleaning, correct ply sock adjustment, proper wear schedule/adjustment, and proper weight-bearing schedule/adjustment Donning prosthesis: Max A Doffing prosthesis: Min A Prosthetic wear tolerance: 2-6 hours, 1x/day, 3-4 days/week Prosthetic weight bearing tolerance: 3 minutes with limb pain Edema: pitting edema Residual limb condition: No open areas, dry skin, normal color and temperature, cylindrical shape Prosthetic description: Silicone liner with pin lock suspension, total contact socket with flexible inner socket, sach foot  TODAY'S TREATMENT:                                                                                                                             DATE: 06/15/2023 Theract: -Sit to stand transfers with RW wheelchair<>mat table at varied height: 19in with maxA to rise to stand; 22in ModA rise to stand, 24in min-modA rise to stand. -stand pivot transfer with RW w/c to mat table with modA once standing.  -Lateral scooting wc<>table at level height SBA; vc for LE positioning for optimal use of LE during  transfer -Static stance for standing with RW; requiring Mod-MaxA for rise to stand d/t patient unsteady on feet posterior lean without proper righting strategy, SBA for stand  -lateral scoot R: modA due to fatigue and uneven surface transfer from low surface to higher surface  Prosthetic training: -Amb 25ft with modA with visual cues on RW for proper LE placement including 90* turn to position to sit for patient safety; chair follow; vc and steering/positioning of walker throughout  Education: Patient and husband were educated on prosthetic care especially with sweating and importance of ensuring skin is dry with use of antiperspirant and lotion only at night time. Ensured patient knew importance of continuing to utilize shrinker at night when out of prosthesis. Patient also educated on weight of prosthesis to ensure dialysis has updated information.   TREATMENT:  DATE: 06/10/2023 Theract: Stand pivot R/L with RW and ModA to Nustep with vc for LE placement and sequencing; Requires demo of ambulation and side stepping with RW STS with RW require demo, vc, and tc   Therex: Nustep level 4 with BLE & BUEs use then BLEs only level 3  Prosthetic training: 64ft ambulation to chair 90* turn to position to sit with modAx2 for patient safety; no chair follow; visual cues on RW for proper LE placement; vc and steering/positioning of walker throughout 68ft ambulation 90* turn to orient to path and 90* to position to sit with modAx2 to wheelchair; visual cues on RW for proper foot placement  TREATMENT:                                                                                                                             DATE:06/03/2023:  Therapeutic Activities: Stand pivot transfer with RW  w/c to Nustep going to her left with modA (2nd person for safety) & Nustep  to w/c going to her right with modA.  RUE contact assist on RW. PT demo & verbal cues on technique & set-up  Therapeutic Exercise: Nustep seat 9 level 5 with LUE & BLEs for 4 min, then Level 3 with BLEs only for 4 min.  Prosthetic Training: Pt amb 5' X 2 with RW with maxA +2 person assist. 1st walk pt crossed prosthesis over LLE and could not correct so PT sat pt in w/c.  Pt is very anxious and needed PT cues to control sequence and timing. PT used theraband on RW for visual cues for foot placement.  Sat on 24" bar stool with BUEs on RW practiced stepping feet forward behind visual target on RW for 1 minute.  Pt amb 7' with RW with MaxA +2 person assist with PT giving slow 1 step commands for sequence.  Pt improved last gait.     TREATMENT:                                                                                                                             DATE:  06/01/2023: Therapeutic Activities: Stand pivot transfer with RW  w/c to Nustep going to her left with modA (2nd person for safety) & Nustep to w/c going to her right with modA.  RUE contact assist on RW. PT demo & verbal cues on technique & set-up Squat pivot transfer leg  press to w/c with minA.  Therapeutic Exercise: Nustep seat 9 level 5 with LUE & BLEs for 5 min, then Level 3 with BLEs only for 3 min. Leg press BLEs 62# 10 reps 2 sets;  marching to simulate SLS in midstance with knee buckling - 37# BLEs concentric & eccentric and isometric hold while lifting contralateral off plate -  5 reps LLE lead and 5 reps RLE lead;   Prosthetic Training: Pt amb 5' with RW with maxA +2 person assist.  PT verbal cues on sequence.   PATIENT EDUCATION: PATIENT EDUCATED ON FOLLOWING PROSTHETIC CARE: Education details: Use of stockinette under proximal liner to decrease itching sensation, no wounds presents to PT recommended not using Vive wear under liner Skin check, Prosthetic cleaning, Propper donning, and Proper wear schedule/adjustment  Prosthetic wear tolerance: 3 hours 2x/day, 4 days/week Person educated: Patient and Spouse Education method: Explanation, Tactile cues, and Verbal cues Education comprehension: verbalized understanding, verbal cues required, tactile cues required, and needs further education  HOME EXERCISE PROGRAM: Access Code: [email or web address] URL: [email or web address] Date: 04/15/2023 Prepared by: Vladimir Faster  Exercises - Supine Bridge  - 1 x daily - 4 x weekly - 2 sets - 5 reps - 2 seconds hold - Supine Lower Trunk Rotation  - 1 x daily - 4 x weekly - 2 sets - 5 reps - 5 seconds hold - Supine Heel Slide with Strap  - 1 x daily - 4 x weekly - 2 sets - 5 reps - 2-3 seconds hold - Supine Hip Abduction  - 1 x daily - 4 x weekly - 2 sets - 5 reps - 2-3 seconds hold - Seated Hip Flexion Toward Target  - 1 x daily - 4 x weekly - 2 sets - 5 reps - 5 seconds hold - Seated Eccentric Abdominal Lean Back  - 1 x daily - 4 x weekly - 2 sets - 5 reps - 5 seconds hold - Seated Sidebending  - 1 x daily - 4 x weekly - 2 sets - 5 reps - 5 seconds hold - Seated Trunk Rotation with Crossed Arms  - 1 x daily - 4 x weekly - 2 sets - 5 reps - 5 seconds hold   ASSESSMENT:  CLINICAL IMPRESSION: Patient demonstrated increased independence by verbalizing prosthetic care and decreasing amount of assistance needed with scooting transfers. Due to patients unsteadiness on feet and decreased awareness of body position, patient lacks ability to sef right when swaying backwards which increases her fall risk. Patient continues to make progress towards goals and is able to walk 32ft with ModA however still requires heavy vc and steering of walker to remain upright during ambulation. Patient will benefit from continued skilled physical therapy to address deficits and decrease caregiver burden.     OBJECTIVE IMPAIRMENTS: Abnormal gait, decreased activity tolerance, decreased balance, decreased endurance, decreased knowledge of  condition, decreased knowledge of use of DME, decreased mobility, difficulty walking, decreased ROM, decreased strength, decreased safety awareness, postural dysfunction, prosthetic dependency , and pain.   ACTIVITY LIMITATIONS: standing, transfers, and locomotion level  PARTICIPATION LIMITATIONS: community activity, household mobility and dependency / burden of care on family  PERSONAL FACTORS: Age, Fitness, Past/current experiences, Time since onset of injury/illness/exacerbation, and 3+ comorbidities: see PMH  are also affecting patient's functional outcome.   REHAB POTENTIAL: Good  CLINICAL DECISION MAKING: Evolving/moderate complexity  EVALUATION COMPLEXITY: Moderate   GOALS: Goals reviewed with patient? Yes  SHORT TERM GOALS: Target date: 04/22/2023:  Patient donnes prosthesis  correctly with husband's assist & verbalizes proper cleaning. Baseline: SEE OBJECTIVE DATA Goal status:   MET 04/21/2023 2.  Patient tolerates prosthesis wear on non-dailysis days >8 hrs total /day and on dialysis days >2 hours/day without skin issues or limb pain >5/10. Baseline: SEE OBJECTIVE DATA Goal status:  partially MET 04/21/2023  3.  Patient able to stand with rolling walker with 1 person modA for 1 min.  Baseline: SEE OBJECTIVE DATA Goal status: MET 04/21/2023  4. Patient ambulates 5' with RW & prosthesis with +2 maxA.  Baseline: SEE OBJECTIVE DATA Goal status: MET 04/21/2023  5. Patient sit to/from stand transfer with +1 maxA and scooting transfer with modA. Baseline: SEE OBJECTIVE DATA Goal status: MET 04/21/2023  LONG TERM GOALS: Target date: 06/17/2022  Patient & husband demonstrates & verbalizes understanding of prosthetic care to enable safe utilization of prosthesis. Baseline: SEE OBJECTIVE DATA Goal status: Partially met 06/15/2023- husband understands and patient able to verbalize understanding of putting prosthesis on and is able to talk someone through  Patient tolerates  prosthesis wear >80% of awake hours on non-dialysis days and >50% on dialysis days without skin issues and limb pain </= 4/10. Baseline: SEE OBJECTIVE DATA Goal status: MET 06/15/2023  Scooting transfers with minA and sit to/from stand transfers with modA.  Baseline: SEE OBJECTIVE DATA Goal status: MET  06/15/2023   Patient ambulates >10' with prosthesis & RW with modA.  Baseline: SEE OBJECTIVE DATA Goal status: Ongoing  06/01/2023  Patient stands with RW support with minA or less for 2 min.  Baseline: SEE OBJECTIVE DATA Goal status:   Ongoing 06/01/2023   PLAN:  PT FREQUENCY: 2x/week  PT DURATION: 12 weeks  PLANNED INTERVENTIONS: 97164- PT Re-evaluation, 97110-Therapeutic exercises, 97530- Therapeutic activity, 97112- Neuromuscular re-education, [phone]- Self Care, 30865- Gait training, [phone]- Prosthetic training, Patient/Family education, Balance training, DME instructions, Therapeutic exercises, Therapeutic activity, Neuromuscular re-education, Gait training, and Self Care  PLAN FOR NEXT SESSION: Do Recertification with progress note next visit, Education about sock usage, continue standing tolerance with least amount of assist for patient ability to don/doff pants for toileting tasks, continue short distance ambulation with RW using visual cues on walker, dynamic standing balance with blazepods for tolerance for ADL/iADL.  Jaymar Loeber, SPT 06/15/2023, 5:01 pm  During this evaluation / treatment session, the Physical Therapist/Physical Therapist Assistant was present and participating in the session. This entire session was performed under direct supervision and direction of a licensed therapist/therapist assistant . I have personally read, edited and approve of the note as written.   Vladimir Faster, PT, DPT 06/15/2023, 5:07 PM

## 2023-06-15 NOTE — Telephone Encounter (Signed)
  Chief Complaint: Cough, chest congestion Symptoms: Cough, chest congestion, runny nose Frequency: Ongoing x 3 weeks Pertinent Negatives: Patient denies fever, N/V Disposition: [] ED /[] Urgent Care (no appt availability in office) / [x] Appointment(In office/virtual)/ []  [facility]/ [] Home Care/ [] Refused Recommended Disposition /[]  Mobile Bus/ []  Follow-up with PCP Additional Notes: Pt reports she has been experiencing dry cough, chest congestion and runny nose x 3 weeks. She notes 2 episodes of loose stools almost every day. Pt denies fever, N/V, CP, she reports mild SOB after coughing fits and reports she wears O2 at HS. OV scheduled for Wednesday 01/22. This RN educated pt on home care, new-worsening symptoms, when to call back/seek emergent care. Pt verbalized understanding and agrees to plan.   Copied from CRM [id number]. Topic: Clinical - Red Word Triage >> Jun 15, 2023 10:09 AM Gaetano Hawthorne wrote: [facility] that prompted transfer to Nurse Triage: Patient has been experiencing Chest Congestion, Cough and diarrhea for a few weeks. Reason for Disposition  Cough has been present for > 3 weeks  Answer Assessment - Initial Assessment Questions 1. ONSET: "When did the cough begin?"      About 3 weeks ago 2. SEVERITY: "How bad is the cough today?"      "Bad" pt using OTC to treat 3. SPUTUM: "Describe the color of your sputum" (none, dry cough; clear, white, yellow, green)     None 4. HEMOPTYSIS: "Are you coughing up any blood?" If so ask: "How much?" (flecks, streaks, tablespoons, etc.)     None 5. DIFFICULTY BREATHING: "Are you having difficulty breathing?" If Yes, ask: "How bad is it?" (e.g., mild, moderate, severe)    - MILD: No SOB at rest, mild SOB with walking, speaks normally in sentences, can lie down, no retractions, pulse < 100.    - MODERATE: SOB at rest, SOB with minimal exertion and prefers to sit, cannot lie down flat, speaks in phrases, mild retractions, audible  wheezing, pulse 100-120.    - SEVERE: Very SOB at rest, speaks in single words, struggling to breathe, sitting hunched forward, retractions, pulse > 120      Pt on O2 at HS, hard to catch her breath after coughing fit 6. FEVER: "Do you have a fever?" If Yes, ask: "What is your temperature, how was it measured, and when did it start?"     None  10. OTHER SYMPTOMS: "Do you have any other symptoms?" (e.g., runny nose, wheezing, chest pain)       Chest congestion, runny nose, clear drainage  Protocols used: Cough - Acute Non-Productive-A-AH

## 2023-06-16 DIAGNOSIS — N2581 Secondary hyperparathyroidism of renal origin: Secondary | ICD-10-CM | POA: Diagnosis not present

## 2023-06-16 DIAGNOSIS — D689 Coagulation defect, unspecified: Secondary | ICD-10-CM | POA: Diagnosis not present

## 2023-06-16 DIAGNOSIS — D631 Anemia in chronic kidney disease: Secondary | ICD-10-CM | POA: Diagnosis not present

## 2023-06-16 DIAGNOSIS — N186 End stage renal disease: Secondary | ICD-10-CM | POA: Diagnosis not present

## 2023-06-16 DIAGNOSIS — Z992 Dependence on renal dialysis: Secondary | ICD-10-CM | POA: Diagnosis not present

## 2023-06-16 DIAGNOSIS — D509 Iron deficiency anemia, unspecified: Secondary | ICD-10-CM | POA: Diagnosis not present

## 2023-06-17 ENCOUNTER — Ambulatory Visit (INDEPENDENT_AMBULATORY_CARE_PROVIDER_SITE_OTHER): Payer: PPO | Admitting: Family Medicine

## 2023-06-17 ENCOUNTER — Encounter: Payer: Self-pay | Admitting: Physical Therapy

## 2023-06-17 ENCOUNTER — Encounter: Payer: Self-pay | Admitting: Family Medicine

## 2023-06-17 ENCOUNTER — Ambulatory Visit: Payer: PPO | Admitting: Physical Therapy

## 2023-06-17 VITALS — BP 122/82 | HR 75 | Temp 98.3°F | Ht 65.0 in

## 2023-06-17 DIAGNOSIS — I480 Paroxysmal atrial fibrillation: Secondary | ICD-10-CM | POA: Diagnosis not present

## 2023-06-17 DIAGNOSIS — M6281 Muscle weakness (generalized): Secondary | ICD-10-CM | POA: Diagnosis not present

## 2023-06-17 DIAGNOSIS — R2681 Unsteadiness on feet: Secondary | ICD-10-CM | POA: Diagnosis not present

## 2023-06-17 DIAGNOSIS — Z89511 Acquired absence of right leg below knee: Secondary | ICD-10-CM

## 2023-06-17 DIAGNOSIS — Z7409 Other reduced mobility: Secondary | ICD-10-CM | POA: Diagnosis not present

## 2023-06-17 DIAGNOSIS — R2689 Other abnormalities of gait and mobility: Secondary | ICD-10-CM

## 2023-06-17 DIAGNOSIS — I5022 Chronic systolic (congestive) heart failure: Secondary | ICD-10-CM

## 2023-06-17 DIAGNOSIS — G546 Phantom limb syndrome with pain: Secondary | ICD-10-CM | POA: Diagnosis not present

## 2023-06-17 DIAGNOSIS — I4891 Unspecified atrial fibrillation: Secondary | ICD-10-CM | POA: Insufficient documentation

## 2023-06-17 DIAGNOSIS — J209 Acute bronchitis, unspecified: Secondary | ICD-10-CM

## 2023-06-17 DIAGNOSIS — L209 Atopic dermatitis, unspecified: Secondary | ICD-10-CM | POA: Insufficient documentation

## 2023-06-17 MED ORDER — TRIAMCINOLONE ACETONIDE 0.1 % EX CREA: 1.0000 | TOPICAL_CREAM | Freq: Two times a day (BID) | CUTANEOUS | 1 refills | Status: DC

## 2023-06-17 MED ORDER — BENZONATATE 100 MG PO CAPS: 100.0000 mg | ORAL_CAPSULE | Freq: Three times a day (TID) | ORAL | 0 refills | Status: DC | PRN

## 2023-06-17 MED ORDER — DOXYCYCLINE HYCLATE 100 MG PO TABS: 100.0000 mg | ORAL_TABLET | Freq: Two times a day (BID) | ORAL | 0 refills | Status: DC

## 2023-06-17 NOTE — Progress Notes (Unsigned)
Patient Office Visit  Assessment & Plan:  Acute bronchitis, unspecified organism -     Doxycycline Hyclate; Take 1 tablet (100 mg total) by mouth 2 (two) times daily.  Dispense: 20 tablet; Refill: 0 -     Benzonatate; Take 1 capsule (100 mg total) by mouth 3 (three) times daily as needed for cough.  Dispense: 20 capsule; Refill: 0  Paroxysmal atrial fibrillation (HCC)  Atopic dermatitis, unspecified type -     Triamcinolone Acetonide; Apply 1 Application topically 2 (two) times daily. Apply to affected areas BID  Dispense: 45 g; Refill: 1  Chronic HFrEF (heart failure with reduced ejection fraction) (HCC)   Patient declines using Flonase.  Patient will use albuterol inhaler as needed every 4-6 hours.  Doxycycline sent to the pharmacy, [facility].  Triamcinolone to use on the rash.  If no improvement or worsening symptoms she is to notify us or go to the ER for further evaluation. Return if symptoms worsen or fail to improve.   Subjective:    Patient ID: Susan [name], female    DOB: 11/11/1950  Age: 73 y.o. MRN: [id number]  Chief Complaint  Patient presents with   Cough    X 4 weeks. Non productive.    Nasal Congestion    X 4 weeks    HPI Coughing for almost 4 weeks- ? Bronchitis-Was tested for COVID last week at dialysis center-negative.  Tried OTC Mucinex, robitussin, nyquil, dayquil but no help.  Dry cough almost four weeks.Pt uses oxygen at night time due to not being able to sleep supine. Pt was given oxygen at last hospitalization. Pt does not use it during the day. . Pt not having fever or chills.  Patient states the cough is not productive but it will not go away.  Pt has occ SOB  but no chest pain or palpitations. Patient is worried it could turn into infection. Pt has inhaler at home but has not been using.  Patient has many chronic medical issues including type 2 diabetes, atrial fibrillation, chronic diastolic heart failure (EF 55-60%), end-stage renal disease,    Patient sees numerous specialists and does have dialysis Tuesday Thursdays and Saturdays.  Will go to dialysis tomorrow. States all the patients at dialysis center have been coughing. Received flu shot this season, is UTD pneumococcal vaccines. Pt is allergic to Keflex. Zithromax interferes with her meds/QT. Per patient/husband pt was in the hospital last year due to below right knee amputation and complications. Former smoker, quit many years ago.  Atopic dermatitis-Rash on her chest/abdomen-patient has noticed this last week, is very itchy.  Patient/husband has not changed soaps detergents.  No med changes. Patient not sure what it is but extremely itchy.  Patient has used over-the-counter [employer] but is not helping.  Patient has not used a steroid cream. The ASCVD Risk score (Arnett DK, et al., 2019) failed to calculate for the following reasons:   Risk score cannot be calculated because patient has a medical history suggesting prior/existing ASCVD  Past Medical History:  Diagnosis Date   Anemia    hx   Anxiety    Arthritis    "generalized" (03/15/2014)   CAD (coronary artery disease)    MI in 2000 - MI  2007 - treated bare metal stent (no nuclear since then as 9/11)   Carotid artery disease (HCC)    Chronic diastolic heart failure (HCC)    a) ECHO (08/2013) EF 55-60% and RV function nl b) RHC (08/2013) RA  4, RV 30/5/7, PA 25/10 (16), PCWP 7, Fick CO/CI 6.3/2.7, PVR 1.5 WU, PA 61 and 66%   Daily headache    "~ every other day; since I fell in June" (03/15/2014)   Depression    Diabetic retinopathy (HCC)    Dyslipidemia    ESRD (end stage renal disease) (HCC)    Dialysis on Tues Thurs Sat   Exertional shortness of breath    GERD (gastroesophageal reflux disease)    History of blood transfusion    History of kidney stones    HTN (hypertension)    Hypothyroidism    Myocardial infarction (HCC)    Obesity    Osteoarthritis    PAF (paroxysmal atrial fibrillation) (HCC)    Peripheral  neuropathy    bilateral feet/hands   PONV (postoperative nausea and vomiting)    RBBB (right bundle branch block)    Old   Stroke (HCC)    mini strokes   Type II diabetes mellitus (HCC)    Type II, [name]. patient has omnipod insulin pump with Novolin R Insulin   Past Surgical History:  Procedure Laterality Date   A/V FISTULAGRAM Left 11/07/2022   Procedure: A/V Fistulagram;  Surgeon: Leonie Douglas, MD;  [facility]: [facility];  Service: Cardiovascular;  Laterality: Left;   ABDOMINAL HYSTERECTOMY  1980's   AMPUTATION Right 02/24/2018   Procedure: RIGHT FOOT GREAT TOE AND 2ND TOE AMPUTATION;  Surgeon: Nadara Mustard, MD;  Location: [facility] OR;  Service: Orthopedics;  Laterality: Right;   AMPUTATION Right 04/30/2018   Procedure: RIGHT TRANSMETATARSAL AMPUTATION;  Surgeon: Nadara Mustard, MD;  Location: [facility] OR;  Service: Orthopedics;  Laterality: Right;   AMPUTATION Right 05/02/2022   Procedure: RIGHT BELOW KNEE AMPUTATION;  Surgeon: Nadara Mustard, MD;  Location[facility] OR;  Service: Orthopedics;  Laterality: Right;   APPLICATION OF WOUND VAC Right 06/13/2022   Procedure: APPLICATION OF WOUND VAC;  Surgeon: Nadara Mustard, MD;  Location: [facility] OR;  Service: Orthopedics;  Laterality: Right;   APPLICATION OF WOUND VAC Left 11/14/2022   Procedure: APPLICATION OF WOUND VAC;  Surgeon: Nadara Mustard, MD;  Location: [facility] OR;  Service: Orthopedics;  Laterality: Left;   AV FISTULA PLACEMENT Left 04/02/2022   Procedure: LEFT ARM ARTERIOVENOUS (AV) FISTULA CREATION;  Surgeon: Nada Libman, MD;  Location: [facility] OR;  Service: Vascular;  Laterality: Left;  PERIPHERAL NERVE BLOCK   AV FISTULA PLACEMENT Right 04/27/2023   Procedure: RIGHT ARM BRACHIOBASILIC ARTERIOVENOUS (AV) FISTULA CREATION;  Surgeon: Leonie Douglas, MD;  Location: [facility] OR;  Service: Vascular;  Laterality: Right;   BASCILIC VEIN TRANSPOSITION Left 07/31/2022   Procedure: LEFT ARM SECOND STAGE BASILIC VEIN TRANSPOSITION;   Surgeon: Nada Libman, MD;  Location: [facility];  Service: Vascular;  Laterality: Left;   BIOPSY  05/27/2020   Procedure: BIOPSY;  Surgeon: Lanelle Bal, DO;  [facility]: [facility];  Service: Endoscopy;;   CATARACT EXTRACTION, BILATERAL Bilateral ?2013   COLONOSCOPY W/ POLYPECTOMY     COLONOSCOPY WITH PROPOFOL N/A 03/13/2019   Procedure: COLONOSCOPY WITH PROPOFOL;  Surgeon: Beverley Fiedler, MD;  Location: [facility] ENDOSCOPY;  Service: Gastroenterology;  Laterality: N/A;   CORONARY ANGIOPLASTY WITH STENT PLACEMENT  1999; 2007   "1 + 1"   ERCP N/A 02/03/2022   Procedure: ENDOSCOPIC RETROGRADE CHOLANGIOPANCREATOGRAPHY (ERCP);  Surgeon: Jeani Hawking, MD;  Location: [facility] ENDOSCOPY;  Service: Gastroenterology;  Laterality: N/A;   ESOPHAGOGASTRODUODENOSCOPY N/A 02/12/2023   Procedure:  ESOPHAGOGASTRODUODENOSCOPY (EGD);  Surgeon: Jeani Hawking, MD;  Location: [facility] ENDOSCOPY;  Service: Gastroenterology;  Laterality: N/A;   ESOPHAGOGASTRODUODENOSCOPY (EGD) WITH PROPOFOL N/A 03/13/2019   Procedure: ESOPHAGOGASTRODUODENOSCOPY (EGD) WITH PROPOFOL;  Surgeon: Beverley Fiedler, MD;  Location: [facility] ENDOSCOPY;  Service: Gastroenterology;  Laterality: N/A;   ESOPHAGOGASTRODUODENOSCOPY (EGD) WITH PROPOFOL N/A 05/27/2020   Procedure: ESOPHAGOGASTRODUODENOSCOPY (EGD) WITH PROPOFOL;  Surgeon: Lanelle Bal, DO;  [facility];  Service: Endoscopy;  Laterality: N/A;   ESOPHAGOGASTRODUODENOSCOPY (EGD) WITH PROPOFOL N/A 09/03/2022   Procedure: ESOPHAGOGASTRODUODENOSCOPY (EGD) WITH PROPOFOL;  Surgeon: Jeani Hawking, MD;  Location: [facility] ENDOSCOPY;  Service: Gastroenterology;  Laterality: N/A;   EYE SURGERY Bilateral    lazer   FLEXIBLE SIGMOIDOSCOPY N/A 05/23/2022   Procedure: FLEXIBLE SIGMOIDOSCOPY;  Surgeon: Jeani Hawking, MD;  Location: [facility] ENDOSCOPY;  Service: Gastroenterology;  Laterality: N/A;   FLEXIBLE SIGMOIDOSCOPY N/A 05/24/2022   Procedure: FLEXIBLE SIGMOIDOSCOPY;  Surgeon: Imogene Burn, MD;  Location: [facility]  ENDOSCOPY;  Service: Gastroenterology;  Laterality: N/A;   FLEXIBLE SIGMOIDOSCOPY N/A 09/03/2022   Procedure: FLEXIBLE SIGMOIDOSCOPY;  Surgeon: Jeani Hawking, MD;  Location: [facility] ENDOSCOPY;  Service: Gastroenterology;  Laterality: N/A;   HEMOSTASIS CLIP PLACEMENT  03/13/2019   Procedure: HEMOSTASIS CLIP PLACEMENT;  Surgeon: Beverley Fiedler, MD;  Location: [facility] ENDOSCOPY;  Service: Gastroenterology;;   HEMOSTASIS CLIP PLACEMENT  05/23/2022   Procedure: HEMOSTASIS CLIP PLACEMENT;  Surgeon: Jeani Hawking, MD;  Location: [facility] ENDOSCOPY;  Service: Gastroenterology;;   HEMOSTASIS CONTROL  05/24/2022   Procedure: HEMOSTASIS CONTROL;  Surgeon: Imogene Burn, MD;  Location: [facility] ENDOSCOPY;  Service: Gastroenterology;;   HOT HEMOSTASIS N/A 05/23/2022   Procedure: HOT HEMOSTASIS (ARGON PLASMA COAGULATION/BICAP);  Surgeon: Jeani Hawking, MD;  Location: [facility] ENDOSCOPY;  Service: Gastroenterology;  Laterality: N/A;   I & D EXTREMITY Left 05/05/2022   Procedure: IRRIGATION AND DEBRIDEMENT LEFT ARM AV FISTULA;  Surgeon: Cephus Shelling, MD;  Location: [facility] OR;  Service: Vascular;  Laterality: Left;   I & D EXTREMITY N/A 11/14/2022   Procedure: IRRIGATION AND DEBRIDEMENT OF LOWER EXTREMITY WOUND;  Surgeon: Nadara Mustard, MD;  [facility] OR;  Service: Orthopedics;  Laterality: N/A;   INSERTION OF DIALYSIS CATHETER Right 04/02/2022   Procedure: INSERTION OF TUNNELED DIALYSIS CATHETER;  Surgeon: Nada Libman, MD;  Location: [facility] OR;  Service: Vascular;  Laterality: Right;   KNEE ARTHROSCOPY Left 10/25/2006   POLYPECTOMY  03/13/2019   Procedure: POLYPECTOMY;  Surgeon: Beverley Fiedler, MD;  Location[facility] ENDOSCOPY;  Service: Gastroenterology;;   REMOVAL OF STONES  02/03/2022   Procedure: REMOVAL OF STONES;  Surgeon: Jeani Hawking, MD;  Location: [facility] ENDOSCOPY;  Service: Gastroenterology;;   REVISON OF ARTERIOVENOUS FISTULA Left 08/20/2022   Procedure: REVISON OF LEFT ARM ARTERIOVENOUS FISTULA;  Surgeon: Nada Libman, MD;   Location[facility] OR;  Service: Vascular;  Laterality: Left;   RIGHT HEART CATH N/A 07/24/2017   Procedure: RIGHT HEART CATH;  Surgeon: Dolores Patty, MD;  [facility];  Service: Cardiovascular;  Laterality: N/A;   RIGHT HEART CATHETERIZATION N/A 09/22/2013   Procedure: RIGHT HEART CATH;  Surgeon: Dolores Patty, MD;  Location: [facility] CATH LAB;  Service: Cardiovascular;  Laterality: N/A;   SHOULDER ARTHROSCOPY WITH OPEN ROTATOR CUFF REPAIR Right 03/14/2014   Procedure: RIGHT SHOULDER ARTHROSCOPY WITH BICEPS RELEASE, OPEN SUBSCAPULA REPAIR, OPEN SUPRASPINATUS REPAIR.;  Surgeon: Cammy Copa, MD;  Location: [facility] OR;  Service: Orthopedics;  Laterality: Right;   SPHINCTEROTOMY  02/03/2022  Procedure: SPHINCTEROTOMY;  Surgeon: Jeani Hawking, MD;  Location: [facility] ENDOSCOPY;  Service: Gastroenterology;;   STUMP REVISION Right 06/13/2022   Procedure: REVISION RIGHT BELOW KNEE AMPUTATION;  Surgeon: Nadara Mustard, MD;  Location: [facility] OR;  Service: Orthopedics;  Laterality: Right;   TEE WITHOUT CARDIOVERSION N/A 02/04/2022   Procedure: TRANSESOPHAGEAL ECHOCARDIOGRAM (TEE);  Surgeon: Dolores Patty, MD;  Location: [facility] ENDOSCOPY;  Service: Cardiovascular;  Laterality: N/A;   THROMBECTOMY W/ EMBOLECTOMY Left 08/20/2022   Procedure: THROMBECTOMY OF LEFT ARM ARTERIOVENOUS FISTULA;  Surgeon: Nada Libman, MD;  Location: [facility] OR;  Service: Vascular;  Laterality: Left;   TOE AMPUTATION Right 02/24/2018   GREAT TOE AND 2ND TOE AMPUTATION   TUBAL LIGATION  1970's   Social History   Tobacco Use   Smoking status: Former    Current packs/day: 0.00    Average packs/day: 3.0 packs/day for 32.0 years (96.0 ttl pk-yrs)    Types: Cigarettes    Start date: 10/24/1965    Quit date: 10/24/1997    Years since quitting: 25.6   Smokeless tobacco: Never  Vaping Use   Vaping status: Never Used  Substance Use Topics   Alcohol use: Not Currently   Drug use: No   Family History  Problem Relation Age of  Onset   Heart attack Mother 31   Allergies  Allergen Reactions   Cephalexin Diarrhea and Other (See Comments)   Codeine Nausea And Vomiting and Other (See Comments)    ROS    Objective:    BP 122/82 (BP Location: Left Arm)   Pulse 75   Temp 98.3 F (36.8 C)   Ht 5\' 5"  (1.651 m)   SpO2 98%   BMI 38.61 kg/m  BP Readings from Last 3 Encounters:  06/17/23 122/82  04/27/23 (!) 115/51  04/13/23 (!) 102/52   Wt Readings from Last 3 Encounters:  04/29/23 232 lb (105.2 kg)  04/27/23 230 lb (104.3 kg)  04/13/23 232 lb (105.2 kg)    Physical Exam Vitals and nursing note reviewed.  Constitutional:      General: She is not in acute distress.    Appearance: Normal appearance.     Comments: Patient comes in with her husband in a wheelchair.  Right leg amputation.  HENT:     Head: Normocephalic.     Right Ear: Tympanic membrane, ear canal and external ear normal.     Left Ear: Tympanic membrane, ear canal and external ear normal.     Nose: Congestion present.     Mouth/Throat:     Pharynx: Oropharynx is clear. No oropharyngeal exudate or posterior oropharyngeal erythema.     Comments: Postnasal drip Eyes:     Extraocular Movements: Extraocular movements intact.     Conjunctiva/sclera: Conjunctivae normal.     Pupils: Pupils are equal, round, and reactive to light.  Cardiovascular:     Rate and Rhythm: Normal rate and regular rhythm.     Heart sounds: Normal heart sounds.  Pulmonary:     Effort: Pulmonary effort is normal. No respiratory distress.     Breath sounds: No wheezing or rhonchi.  Musculoskeletal:     Left lower leg: No edema.     Comments: Right leg-below knee amputation  Skin:    Findings: Rash present.     Comments: Scaly erythematous 3-4 mm patches over chest/abdomen area, pt is scratching.   Neurological:     General: No focal deficit present.     Mental Status: She is alert and  oriented to person, place, and time.  Psychiatric:        Mood and Affect:  Mood normal.        Behavior: Behavior normal.        Thought Content: Thought content normal.        Judgment: Judgment normal.      No results found for any visits on 06/17/23.

## 2023-06-17 NOTE — Therapy (Addendum)
OUTPATIENT PHYSICAL THERAPY PROSTHETIC TREATMENT AND PROGRESS NOTE/RE-EVALUATION   Patient Name: Susan [name] MRN: [id number] DOB:22-Sep-1950, 73 y.o., female Today's Date: 06/17/2023  PCP: Donita Brooks, MD  REFERRING PROVIDER: Nadara Mustard, MD  Progress Note Reporting Period 05/04/2023 to 06/17/2023  See note below for Objective Data and Assessment of Progress/Goals.      END OF SESSION:  PT End of Session - 06/17/23 1322     Visit Number 20    Number of Visits 44    Date for PT Re-Evaluation 09/10/23    Authorization Type [employer]    Progress Note Due on Visit 30    PT Start Time 1325    PT Stop Time 1427    PT Time Calculation (min) 62 min    Equipment Utilized During Treatment Gait belt    Activity Tolerance Patient tolerated treatment well;Patient limited by fatigue    Behavior During Therapy WFL for tasks assessed/performed               Past Medical History:  Diagnosis Date   Anemia    hx   Anxiety    Arthritis    "generalized" (03/15/2014)   CAD (coronary artery disease)    MI in 2000 - MI  2007 - treated bare metal stent (no nuclear since then as 9/11)   Carotid artery disease (HCC)    Chronic diastolic heart failure (HCC)    a) ECHO (08/2013) EF 55-60% and RV function nl b) RHC (08/2013) RA 4, RV 30/5/7, PA 25/10 (16), PCWP 7, Fick CO/CI 6.3/2.7, PVR 1.5 WU, PA 61 and 66%   Daily headache    "~ every other day; since I fell in June" (03/15/2014)   Depression    Diabetic retinopathy (HCC)    Dyslipidemia    ESRD (end stage renal disease) (HCC)    Dialysis on Tues Thurs Sat   Exertional shortness of breath    GERD (gastroesophageal reflux disease)    History of blood transfusion    History of kidney stones    HTN (hypertension)    Hypothyroidism    Myocardial infarction (HCC)    Obesity    Osteoarthritis    PAF (paroxysmal atrial fibrillation) (HCC)    Peripheral neuropathy    bilateral feet/hands   PONV (postoperative  nausea and vomiting)    RBBB (right bundle branch block)    Old   Stroke (HCC)    mini strokes   Type II diabetes mellitus (HCC)    Type II, [name]. patient has omnipod insulin pump with Novolin R Insulin   Past Surgical History:  Procedure Laterality Date   A/V FISTULAGRAM Left 11/07/2022   Procedure: A/V Fistulagram;  Surgeon: Leonie Douglas, MD;  [facility]: [facility];  Service: Cardiovascular;  Laterality: Left;   ABDOMINAL HYSTERECTOMY  1980's   AMPUTATION Right 02/24/2018   Procedure: RIGHT FOOT GREAT TOE AND 2ND TOE AMPUTATION;  Surgeon: Nadara Mustard, MD;  Location: [facility] OR;  Service: Orthopedics;  Laterality: Right;   AMPUTATION Right 04/30/2018   Procedure: RIGHT TRANSMETATARSAL AMPUTATION;  Surgeon: Nadara Mustard, MD;  Location: [facility] OR;  Service: Orthopedics;  Laterality: Right;   AMPUTATION Right 05/02/2022   Procedure: RIGHT BELOW KNEE AMPUTATION;  Surgeon: Nadara Mustard, MD;  Location: [facility] OR;  Service: Orthopedics;  Laterality: Right;   APPLICATION OF WOUND VAC Right 06/13/2022   Procedure: APPLICATION OF WOUND VAC;  Surgeon: Nadara Mustard,  MD;  Location: [facility] OR;  Service: Orthopedics;  Laterality: Right;   APPLICATION OF WOUND VAC Left 11/14/2022   Procedure: APPLICATION OF WOUND VAC;  Surgeon: Nadara Mustard, MD;  [facility]: [facility];  Service: Orthopedics;  Laterality: Left;   AV FISTULA PLACEMENT Left 04/02/2022   Procedure: LEFT ARM ARTERIOVENOUS (AV) FISTULA CREATION;  Surgeon: Nada Libman, MD;  [facility]: [facility];  Service: Vascular;  Laterality: Left;  PERIPHERAL NERVE BLOCK   AV FISTULA PLACEMENT Right 04/27/2023   Procedure: RIGHT ARM BRACHIOBASILIC ARTERIOVENOUS (AV) FISTULA CREATION;  Surgeon: Leonie Douglas, MD;  [facility]: [facility];  Service: Vascular;  Laterality: Right;   BASCILIC VEIN TRANSPOSITION Left 07/31/2022   Procedure: LEFT ARM SECOND STAGE BASILIC VEIN TRANSPOSITION;  Surgeon: Nada Libman, MD;  [facility]: [facility];  Service:  Vascular;  Laterality: Left;   BIOPSY  05/27/2020   Procedure: BIOPSY;  Surgeon: Lanelle Bal, DO;  [facility]: [facility];  Service: Endoscopy;;   CATARACT EXTRACTION, BILATERAL Bilateral ?2013   COLONOSCOPY W/ POLYPECTOMY     COLONOSCOPY WITH PROPOFOL N/A 03/13/2019   Procedure: COLONOSCOPY WITH PROPOFOL;  Surgeon: Beverley Fiedler, MD;  Location: [facility] ENDOSCOPY;  Service: Gastroenterology;  Laterality: N/A;   CORONARY ANGIOPLASTY WITH STENT PLACEMENT  1999; 2007   "1 + 1"   ERCP N/A 02/03/2022   Procedure: ENDOSCOPIC RETROGRADE CHOLANGIOPANCREATOGRAPHY (ERCP);  Surgeon: Jeani Hawking, MD;  Location: [facility] ENDOSCOPY;  Service: Gastroenterology;  Laterality: N/A;   ESOPHAGOGASTRODUODENOSCOPY N/A 02/12/2023   Procedure: ESOPHAGOGASTRODUODENOSCOPY (EGD);  Surgeon: Jeani Hawking, MD;  Location: [facility] ENDOSCOPY;  Service: Gastroenterology;  Laterality: N/A;   ESOPHAGOGASTRODUODENOSCOPY (EGD) WITH PROPOFOL N/A 03/13/2019   Procedure: ESOPHAGOGASTRODUODENOSCOPY (EGD) WITH PROPOFOL;  Surgeon: Beverley Fiedler, MD;  Location: [facility] ENDOSCOPY;  Service: Gastroenterology;  Laterality: N/A;   ESOPHAGOGASTRODUODENOSCOPY (EGD) WITH PROPOFOL N/A 05/27/2020   Procedure: ESOPHAGOGASTRODUODENOSCOPY (EGD) WITH PROPOFOL;  Surgeon: Lanelle Bal, DO;  [facility];  Service: Endoscopy;  Laterality: N/A;   ESOPHAGOGASTRODUODENOSCOPY (EGD) WITH PROPOFOL N/A 09/03/2022   Procedure: ESOPHAGOGASTRODUODENOSCOPY (EGD) WITH PROPOFOL;  Surgeon: Jeani Hawking, MD;  Location: [facility] ENDOSCOPY;  Service: Gastroenterology;  Laterality: N/A;   EYE SURGERY Bilateral    lazer   FLEXIBLE SIGMOIDOSCOPY N/A 05/23/2022   Procedure: FLEXIBLE SIGMOIDOSCOPY;  Surgeon: Jeani Hawking, MD;  Location: [name] ENDOSCOPY;  Service: Gastroenterology;  Laterality: N/A;   FLEXIBLE SIGMOIDOSCOPY N/A 05/24/2022   Procedure: FLEXIBLE SIGMOIDOSCOPY;  Surgeon: Imogene Burn, MD;  Location: [facility] ENDOSCOPY;  Service: Gastroenterology;  Laterality: N/A;    FLEXIBLE SIGMOIDOSCOPY N/A 09/03/2022   Procedure: FLEXIBLE SIGMOIDOSCOPY;  Surgeon: Jeani Hawking, MD;  Location: [facility] ENDOSCOPY;  Service: Gastroenterology;  Laterality: N/A;   HEMOSTASIS CLIP PLACEMENT  03/13/2019   Procedure: HEMOSTASIS CLIP PLACEMENT;  Surgeon: Beverley Fiedler, MD;  Location: [facility] ENDOSCOPY;  Service: Gastroenterology;;   HEMOSTASIS CLIP PLACEMENT  05/23/2022   Procedure: HEMOSTASIS CLIP PLACEMENT;  Surgeon: Jeani Hawking, MD;  Location: [facility] ENDOSCOPY;  Service: Gastroenterology;;   HEMOSTASIS CONTROL  05/24/2022   Procedure: HEMOSTASIS CONTROL;  Surgeon: Imogene Burn, MD;  Location: [facility] ENDOSCOPY;  Service: Gastroenterology;;   HOT HEMOSTASIS N/A 05/23/2022   Procedure: HOT HEMOSTASIS (ARGON PLASMA COAGULATION/BICAP);  Surgeon: Jeani Hawking, MD;  Location: [facility] ENDOSCOPY;  Service: Gastroenterology;  Laterality: N/A;   I & D EXTREMITY Left 05/05/2022   Procedure: IRRIGATION AND DEBRIDEMENT LEFT ARM AV FISTULA;  Surgeon: Cephus Shelling, MD;  Location: [facility] OR;  Service: Vascular;  Laterality: Left;  I & D EXTREMITY N/A 11/14/2022   Procedure: IRRIGATION AND DEBRIDEMENT OF LOWER EXTREMITY WOUND;  Surgeon: Nadara Mustard, MD;  Location: [facility] OR;  Service: Orthopedics;  Laterality: N/A;   INSERTION OF DIALYSIS CATHETER Right 04/02/2022   Procedure: INSERTION OF TUNNELED DIALYSIS CATHETER;  Surgeon: Nada Libman, MD;  Location: [facility] OR;  Service: Vascular;  Laterality: Right;   KNEE ARTHROSCOPY Left 10/25/2006   POLYPECTOMY  03/13/2019   Procedure: POLYPECTOMY;  Surgeon: Beverley Fiedler, MD;  [facility]OS[facility];  Service: Gastroenterology;;   REMOVAL OF STONES  02/03/2022   Procedure: REMOVAL OF STONES;  Surgeon: Jeani Hawking, MD;  Location: [facility] ENDOSCOPY;  Service: Gastroenterology;;   REVISON OF ARTERIOVENOUS FISTULA Left 08/20/2022   Procedure: REVISON OF LEFT ARM ARTERIOVENOUS FISTULA;  Surgeon: Nada Libman, MD;  Location[facility] OR;  Service: Vascular;  Laterality: Left;    RIGHT HEART CATH N/A 07/24/2017   Procedure: RIGHT HEART CATH;  Surgeon: Dolores Patty, MD;  [facility];  Service: Cardiovascular;  Laterality: N/A;   RIGHT HEART CATHETERIZATION N/A 09/22/2013   Procedure: RIGHT HEART CATH;  Surgeon: Dolores Patty, MD;  Location: [facility] CATH LAB;  Service: Cardiovascular;  Laterality: N/A;   SHOULDER ARTHROSCOPY WITH OPEN ROTATOR CUFF REPAIR Right 03/14/2014   Procedure: RIGHT SHOULDER ARTHROSCOPY WITH BICEPS RELEASE, OPEN SUBSCAPULA REPAIR, OPEN SUPRASPINATUS REPAIR.;  Surgeon: Cammy Copa, MD;  Location: [facility] OR;  Service: Orthopedics;  Laterality: Right;   SPHINCTEROTOMY  02/03/2022   Procedure: SPHINCTEROTOMY;  Surgeon: Jeani Hawking, MD;  Location: [name] ENDOSCOPY;  Service: Gastroenterology;;   STUMP REVISION Right 06/13/2022   Procedure: REVISION RIGHT BELOW KNEE AMPUTATION;  Surgeon: Nadara Mustard, MD;  Location: [facility] OR;  Service: Orthopedics;  Laterality: Right;   TEE WITHOUT CARDIOVERSION N/A 02/04/2022   Procedure: TRANSESOPHAGEAL ECHOCARDIOGRAM (TEE);  Surgeon: Dolores Patty, MD;  Location: [facility] ENDOSCOPY;  Service: Cardiovascular;  Laterality: N/A;   THROMBECTOMY W/ EMBOLECTOMY Left 08/20/2022   Procedure: THROMBECTOMY OF LEFT ARM ARTERIOVENOUS FISTULA;  Surgeon: Nada Libman, MD;  Location[facility] OR;  Service: Vascular;  Laterality: Left;   TOE AMPUTATION Right 02/24/2018   GREAT TOE AND 2ND TOE AMPUTATION   TUBAL LIGATION  1970's   Patient Active Problem List   Diagnosis Date Noted   Hyperosmolar hyperglycemic state (HHS) (HCC) 02/05/2023   Depression 01/29/2023   Nausea 01/24/2023   Intractable nausea and vomiting 01/20/2023   Proctitis 01/15/2023   Acute on chronic anemia 11/25/2022   Right below-knee amputee (HCC) 11/20/2022   Cellulitis of left lower extremity 11/14/2022   Maggot infestation 11/12/2022   Complicated wound infection 11/10/2022   Secondary hypercoagulable state (HCC) 09/04/2022   Right sided  abdominal pain 08/31/2022   Constipation 06/07/2022   History of Clostridioides difficile colitis 06/06/2022   Below-knee amputation of right lower extremity (HCC) 06/06/2022   Diverticulitis 06/05/2022   Stercoral colitis 06/05/2022   C. difficile colitis 06/05/2022   Spleen hematoma 06/05/2022   Dehiscence of amputation stump of right lower extremity (HCC) 06/05/2022   Rectal ulcer 05/27/2022   ESRD (end stage renal disease) (HCC) 05/27/2022   GI bleed 05/23/2022   Difficult intravenous access 05/23/2022   Gangrene of right foot (HCC) 05/02/2022   S/P BKA (below knee amputation) unilateral, right (HCC) 05/02/2022   Unspecified protein-calorie malnutrition (HCC) 04/15/2022   Secondary hyperparathyroidism of renal origin (HCC) 04/14/2022   Coagulation defect, unspecified (HCC) 04/09/2022   Acquired absence of other left  toe(s) (HCC) 04/07/2022   Allergy, unspecified, initial encounter 04/07/2022   Dependence on renal dialysis (HCC) 04/07/2022   Gout due to renal impairment, unspecified site 04/07/2022   Hypertensive heart and chronic kidney disease with heart failure and with stage 5 chronic kidney disease, or end stage renal disease (HCC) 04/07/2022   Personal history of transient ischemic attack (TIA), and cerebral infarction without residual deficits 04/07/2022   Renal osteodystrophy 04/07/2022   Venous stasis ulcer of right calf (HCC) 03/31/2022   Fistula, colovaginal 03/26/2022   Diarrhea 03/26/2022   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   Sepsis without acute organ dysfunction (HCC)    Bacteremia    Acute pancreatitis 02/01/2022   Abdominal pain 02/01/2022   SIRS (systemic inflammatory response syndrome) (HCC) 02/01/2022   Transaminitis 02/01/2022   History of anemia due to chronic kidney disease 02/01/2022   Paroxysmal atrial fibrillation (HCC) 02/01/2022   Uncontrolled type 2 diabetes mellitus with hyperglycemia, with long-term current use of insulin (HCC) 01/14/2022    NSTEMI (non-ST elevated myocardial infarction) (HCC) 03/05/2021   Acute renal failure superimposed on stage 4 chronic kidney disease (HCC) 08/22/2020   Hypoalbuminemia 05/25/2020   GERD (gastroesophageal reflux disease) 05/25/2020   Pressure injury of skin 05/17/2020   Acute on chronic combined systolic and diastolic congestive heart failure (HCC) 03/07/2020   Type 2 diabetes mellitus with diabetic polyneuropathy, with long-term current use of insulin (HCC) 03/07/2020   Obesity, Class III, BMI 40-49.9 (morbid obesity) (HCC) 03/07/2020   Common bile duct (CBD) obstruction 05/28/2019   Benign neoplasm of ascending colon    Benign neoplasm of transverse colon    Benign neoplasm of descending colon    Benign neoplasm of sigmoid colon    Gastric polyps    Hyperkalemia 03/11/2019   Prolonged QT interval 03/11/2019   Acute blood loss anemia 03/11/2019   Onychomycosis 06/21/2018   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)    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 (HCC)    PVD (peripheral vascular disease) (HCC) 10/26/2017   E-coli UTI 07/27/2017   Hypothyroidism 07/27/2017   AKI (acute kidney injury) (HCC)    PAH (pulmonary artery hypertension) (HCC)    Impaired ambulation 07/19/2017   Nausea & vomiting 07/15/2017   Leg cramps 02/27/2017   Peripheral edema 01/12/2017   Diabetic neuropathy (HCC) 11/12/2016   CKD (chronic kidney disease), stage IV (HCC) 10/24/2015   Anemia of chronic disease 10/03/2015   Historical diagnosis of generalized anxiety disorder 10/03/2015   Secondary insomnia 10/03/2015   Hyperglycemia due to diabetes mellitus (HCC) 06/07/2015   Non compliance with medical treatment 04/17/2014   Rotator cuff tear 03/14/2014   Obesity 09/23/2013   Chronic HFrEF (heart failure with reduced ejection fraction) (HCC) 06/03/2013   Hypotension 12/25/2012   Hyponatremia 12/25/2012   Urinary incontinence    MDD (major depressive disorder), recurrent  episode, moderate (HCC) 11/12/2010   RBBB (right bundle branch block)    Wide-complex tachycardia    Coronary artery disease    Hyperlipemia 01/22/2009   Chronic hypotension 01/22/2009    ONSET DATE: 03/09/2023 MD referral to PT  REFERRING DIAG: S88.111D (ICD-10-CM) - Below-knee amputation of right lower extremity, subsequent encounter   THERAPY DIAG:  Unsteadiness on feet  Muscle weakness (generalized)  Phantom limb syndrome with pain (HCC)  Other abnormalities of gait and mobility  Right below-knee amputee (HCC)  Impaired functional mobility, balance, gait, and endurance  Rationale for Evaluation and Treatment: Rehabilitation  SUBJECTIVE:  Patient  states she is feeling good. Has been itchy lately despite education on wiping off liner which has resulted in a minor scratch.  SUBJECTIVE STATEMENT: Patient reports she is improving but feels like she needs further PT.     PERTINENT HISTORY: Right TTA 05/02/22, Lymphedema, idiopathic chronic venous HTN with ulcer, depression, colitis, diverticulitis, ESRD, gout, NSTEMI, DM2, polyneuropathy, PVD, CAD, right bundle branch block, mini strokes  PAIN:  Are you having pain? No pain today.   PRECAUTIONS: Fall and Other: No BP LUE  WEIGHT BEARING RESTRICTIONS: No  FALLS: Has patient fallen in last 6 months? No  LIVING ENVIRONMENT: Lives with: lives with their spouse and 2 small dogs Lives in: mobile home Home Access: Ramped entrance Home layout: One level Stairs: Yes: External: 6-7 steps; can reach both Has following equipment at home: Single point cane, Environmental consultant - 2 wheeled, Environmental consultant - 4 wheeled, Wheelchair (manual), [employer], Grab bars, and Ramped entry  OCCUPATION:  retired  PLOF: prior to amputation for ~year used RW in home & community  PATIENT GOALS:  to use prosthesis to walk, get out w/c in & out of car, get to bathroom.   OBJECTIVE:  COGNITION: Overall cognitive status:  Eval on 03/23/2023:   Within functional  limits for tasks assessed  POSTURE:  Eval on 03/23/2023:  rounded shoulders, forward head, increased thoracic kyphosis, flexed trunk , and weight shift left  LOWER EXTREMITY ROM: ROM Right eval Left eval  Hip flexion    Hip extension    Hip abduction    Hip adduction    Hip internal rotation    Hip external rotation    Knee flexion    Knee extension    Ankle dorsiflexion    Ankle plantarflexion    Ankle inversion    Ankle eversion     (Blank rows = not tested)  LOWER EXTREMITY MMT:  MMT Right eval Left eval  Hip flexion 3-/5 3-/5  Hip extension 2/5 2/5  Hip abduction 2+/5 2+/5  Hip adduction    Hip internal rotation    Hip external rotation    Knee flexion 3-/5 3-/5  Knee extension 3-/5 3-/5  Ankle dorsiflexion    Ankle plantarflexion    Ankle inversion    Ankle eversion    At Evaluation all strength testing is grossly seated and functionally standing / gait. (Blank rows = not tested)  TRANSFERS: 06/15/2023: -Sit to stand transfers with RW wheelchair<>mat table at varied height: 19in with maxA to rise to stand; 22in ModA rise to stand, 24in min-modA rise to stand. -stand pivot transfer with RW w/c to mat table with modA once standing.  -Lateral scooting wc<>table at level height SBA; vc for LE positioning for optimal use of LE during transfer -lateral scoot R: modA due to fatigue and uneven surface transfer from low surface to higher surface   05/04/2023: Squat pivot transfers with modA.  04/21/2023: Pt sit to stand w/c to //bar with one hand pushing on w/c and other hand on //bar with modA first time & MinA 2nd/ 3rd time.  Best is RUE on //bar & LUE on w/c.  Stand to sit reaching LUE to w/c with minA to control descent.   PT demo & verbal cues on sit to/from stand w/c to RW.  1st time modA 2 person. 2nd time maxA one person.  Stand to sit with minA and constant cueing. Scooting transfers with minA with w/c & mat direct contact & same ht.   Eval on  03/23/2023:   Sit to stand: Max A (two-person assist) from 22" w/c to rolling walker Stand to sit: Max A (two-person assist) rolling walker to wheelchair  FUNCTIONAL TESTs:   06/15/2023: -Static stance for standing with RW; requiring Mod-MaxA for rise to stand d/t patient unsteady on feet posterior lean without proper righting strategy, SBA for stand   04/21/2023: Pt able to stand 60 sec with BUE on //bars with supervision.  Pt able to stand for 1 min with RW support with minA.  Eval on 03/23/2023: Patient maintained upright holding rolling walker with mod assist 2 people for safety for 30 seconds.  GAIT: 06/17/2023: Patient ambulated 21ft (maximal tolerated distance) with RW ModA (2 person for safety). Required VC for LE sequencing and minA to steer walker and prevent tip over.   04/21/2023: Pt amb 5' in //bars with modA (2 person for safety) with PT verbal & tactile cues on sequence and weight shift.  Pt amb 10' with RW with maxA (2 person for safety) with PT verbal & tactile cues on sequence and weight shift.  Eval on 03/23/2023:  Patient took 2 steps (one per LE) with +2 max assist with rolling walker and TTA prosthesis.  Patient adducting prosthesis stepping on left foot with no awareness.  CURRENT PROSTHETIC WEAR ASSESSMENT: 06/17/2023:  Independence with prosthetic care:  Patient and husband are independent with: skin check, prosthetic cleaning, ply sock cleaning, proper wear schedule/adjustment Patient is dependent with: residual limb care, correct ply sock adjustment, and proper weight-bearing schedule/adjustment Donning prosthesis: husband is independent with proper donning. Patient requires MaxA. Patient can verbally direct someone how to properly assist. Doffing prosthesis: Husband is independent with proper donning. Patient requires modA. Patient can verbally direct someone how to properly assist. Prosthetic wear tolerance: majority of awake hours including dialysis hours,  1x/day, 3-4 days/week Prosthetic weight bearing tolerance: able to tolerate of standing without complains of prosthetic limb pain. Time not limited by limb discomfort but limited by muscle fatigue.   Eval on 03/23/2023:   Patient is dependent with: skin check, residual limb care, prosthetic cleaning, ply sock cleaning, correct ply sock adjustment, proper wear schedule/adjustment, and proper weight-bearing schedule/adjustment Donning prosthesis: Max A Doffing prosthesis: Min A Prosthetic wear tolerance: 2-6 hours, 1x/day, 3-4 days/week Prosthetic weight bearing tolerance: 3 minutes with limb pain Edema: pitting edema Residual limb condition: No open areas, dry skin, normal color and temperature, cylindrical shape Prosthetic description: Silicone liner with pin lock suspension, total contact socket with flexible inner socket, sach foot  TODAY'S TREATMENT:                                                                                                                             DATE: 06/17/2023 Therex: Nustep level 5 with BLE & BUEs use then BLEs only level 4  Theract: Stand pivot R<>L with RW ModA, VC for sequencing of LE w/ walker adjustments. STS with RW at  24in, 22in, 20in x5ea height. 24in:Mod-MinA , 22in: ModA, Mod-MaxA with fatigue however controlled descent throughout. Requires min-modA to shift weight over feet for stable position and to stabilize walker to prevent tip over while patient establishes balance in standing. All trials with increased vc for leaning anteriorly before attempt to rise to stand and sequencing of UE surface<>RW.  Patient required increased rest between bouts.   Prosthetic training: -Amb 66ft (max distance tolerated) with modA (x2 for pt safety) with visual cues on RW for proper LE placement; chair follow; vc and steering/positioning of walker throughout and prevent tip over.     TREATMENT:                                                                                                                              DATE: 06/15/2023 Theract: -Sit to stand transfers with RW wheelchair<>mat table at varied height: 19in with maxA to rise to stand; 22in ModA rise to stand, 24in min-modA rise to stand. -stand pivot transfer with RW w/c to mat table with modA once standing.  -Lateral scooting wc<>table at level height SBA; vc for LE positioning for optimal use of LE during transfer -Static stance for standing with RW; requiring Mod-MaxA for rise to stand d/t patient unsteady on feet posterior lean without proper righting strategy, SBA for stand  -lateral scoot R: modA due to fatigue and uneven surface transfer from low surface to higher surface  Prosthetic training: -Amb 39ft with modA with visual cues on RW for proper LE placement including 90* turn to position to sit for patient safety; chair follow; vc and steering/positioning of walker throughout  Education: Patient and husband were educated on prosthetic care especially with sweating and importance of ensuring skin is dry with use of antiperspirant and lotion only at night time. Ensured patient knew importance of continuing to utilize shrinker at night when out of prosthesis. Patient also educated on weight of prosthesis to ensure dialysis has updated information.   TREATMENT:                                                                                                                             DATE: 06/10/2023 Theract: Stand pivot R/L with RW and ModA to Nustep with vc for LE placement and sequencing; Requires demo of ambulation and side stepping with RW STS with RW require demo, vc, and tc   Therex: Nustep  level 4 with BLE & BUEs use then BLEs only level 3  Prosthetic training: 34ft ambulation to chair 90* turn to position to sit with modAx2 for patient safety; no chair follow; visual cues on RW for proper LE placement; vc and steering/positioning of walker  throughout 28ft ambulation 90* turn to orient to path and 90* to position to sit with modAx2 to wheelchair; visual cues on RW for proper foot placement  TREATMENT:                                                                                                                             DATE:06/03/2023:  Therapeutic Activities: Stand pivot transfer with RW  w/c to Nustep going to her left with modA (2nd person for safety) & Nustep to w/c going to her right with modA.  RUE contact assist on RW. PT demo & verbal cues on technique & set-up  Therapeutic Exercise: Nustep seat 9 level 5 with LUE & BLEs for 4 min, then Level 3 with BLEs only for 4 min.  Prosthetic Training: Pt amb 5' X 2 with RW with maxA +2 person assist. 1st walk pt crossed prosthesis over LLE and could not correct so PT sat pt in w/c.  Pt is very anxious and needed PT cues to control sequence and timing. PT used theraband on RW for visual cues for foot placement.  Sat on 24" bar stool with BUEs on RW practiced stepping feet forward behind visual target on RW for 1 minute.  Pt amb 7' with RW with MaxA +2 person assist with PT giving slow 1 step commands for sequence.  Pt improved last gait.     TREATMENT:                                                                                                                             DATE:  06/01/2023: Therapeutic Activities: Stand pivot transfer with RW  w/c to Nustep going to her left with modA (2nd person for safety) & Nustep to w/c going to her right with modA.  RUE contact assist on RW. PT demo & verbal cues on technique & set-up Squat pivot transfer leg press to w/c with minA.  Therapeutic Exercise: Nustep seat 9 level 5 with LUE & BLEs for 5 min, then Level 3 with BLEs only for 3 min. Leg press BLEs 62# 10 reps 2 sets;  marching to  simulate SLS in midstance with knee buckling - 37# BLEs concentric & eccentric and isometric hold while lifting contralateral off plate -  5 reps LLE lead  and 5 reps RLE lead;   Prosthetic Training: Pt amb 5' with RW with maxA +2 person assist.  PT verbal cues on sequence.   PATIENT EDUCATION: PATIENT EDUCATED ON FOLLOWING PROSTHETIC CARE: Education details: Use of stockinette under proximal liner to decrease itching sensation, no wounds presents to PT recommended not using Vive wear under liner Skin check, Prosthetic cleaning, Propper donning, and Proper wear schedule/adjustment Prosthetic wear tolerance: 3 hours 2x/day, 4 days/week Person educated: Patient and Spouse Education method: Explanation, Tactile cues, and Verbal cues Education comprehension: verbalized understanding, verbal cues required, tactile cues required, and needs further education  HOME EXERCISE PROGRAM: Access Code: [email or web address] URL: [email or web address] Date: 04/15/2023 Prepared by: Vladimir Faster  Exercises - Supine Bridge  - 1 x daily - 4 x weekly - 2 sets - 5 reps - 2 seconds hold - Supine Lower Trunk Rotation  - 1 x daily - 4 x weekly - 2 sets - 5 reps - 5 seconds hold - Supine Heel Slide with Strap  - 1 x daily - 4 x weekly - 2 sets - 5 reps - 2-3 seconds hold - Supine Hip Abduction  - 1 x daily - 4 x weekly - 2 sets - 5 reps - 2-3 seconds hold - Seated Hip Flexion Toward Target  - 1 x daily - 4 x weekly - 2 sets - 5 reps - 5 seconds hold - Seated Eccentric Abdominal Lean Back  - 1 x daily - 4 x weekly - 2 sets - 5 reps - 5 seconds hold - Seated Sidebending  - 1 x daily - 4 x weekly - 2 sets - 5 reps - 5 seconds hold - Seated Trunk Rotation with Crossed Arms  - 1 x daily - 4 x weekly - 2 sets - 5 reps - 5 seconds hold   ASSESSMENT:  CLINICAL IMPRESSION: Patient tolerated increased activity however due to fatigue required longer rest breaks between bouts of sit to stands. Patient demonstrated improved STS technique to rise from lower surfaces however still requires heavy VC for sequencing. Patient was able to ambulate 50ft with modAx2 for safety with  chair follow before requiring seated break. While patients deficits with strength, endurance, balance and coordination have improved since evaluation, patient would continue to make progress towards increased function with continuation of skilled physical therapy. Patients deficits currently restrict her from independence with toileting, ambulation, and caring for herself as well as put her at high risk for falls.   OBJECTIVE IMPAIRMENTS: Abnormal gait, decreased activity tolerance, decreased balance, decreased endurance, decreased knowledge of condition, decreased knowledge of use of DME, decreased mobility, difficulty walking, decreased ROM, decreased strength, decreased safety awareness, postural dysfunction, prosthetic dependency , and pain.   ACTIVITY LIMITATIONS: standing, transfers, and locomotion level  PARTICIPATION LIMITATIONS: community activity, household mobility and dependency / burden of care on family  PERSONAL FACTORS: Age, Fitness, Past/current experiences, Time since onset of injury/illness/exacerbation, and 3+ comorbidities: see PMH  are also affecting patient's functional outcome.   REHAB POTENTIAL: Good  CLINICAL DECISION MAKING: Evolving/moderate complexity  EVALUATION COMPLEXITY: Moderate   GOALS: Goals reviewed with patient? Yes  SHORT TERM GOALS: Target date: 07/15/2023:  Patient & husband verbalize proper residual limb care and understanding for adjusting ply socks.  Baseline: SEE OBJECTIVE DATA Goal status:   set 06/17/2023 2.  Patient  able to sit to stand w/c to rolling walker with 1 person modA and maintain upright with RW for 4 min with SBA to assist with toileting needs/hygienic cleaning.  Baseline: SEE OBJECTIVE DATA Goal status: set 06/17/2023  3. Patient ambulates 51ft including turning 90* to position to sit with RW & prosthesis with modA to navigate reaching bathroom in home.  Baseline: SEE OBJECTIVE DATA Goal status: set 06/17/2023   LONG TERM  GOALS: Target date: 09/10/2023  Patient & husband demonstrates & verbalizes understanding of prosthetic care to enable safe utilization of prosthesis. Baseline: SEE OBJECTIVE DATA Goal status: On going/Partially met 06/15/2023  Patient tolerates prosthesis wear >80% of awake hours on non-dialysis days and >50% on dialysis days without skin issues and limb pain </= 4/10. Baseline: SEE OBJECTIVE DATA Goal status: MET 06/15/2023  Scooting transfers with minA and sit to/from stand transfers with modA.  Baseline: SEE OBJECTIVE DATA - scooting met but sit to stand MaxA Goal status: Partially MET 06/15/2023 / ongoing for sit to stand  Patient ambulates >17ft including turning 90* to position to sit with prosthesis & RW with modA for household navigation.  Baseline: SEE OBJECTIVE DATA Goal status: updated  06/17/2023  Patient able to maintain static stance with RW support with SBA for 5 min to assist with hygienic cleaning.  Baseline: SEE OBJECTIVE DATA Goal status:   updated 06/17/2023   PLAN:  PT FREQUENCY: 2x/week  PT DURATION: 12 weeks  PLANNED INTERVENTIONS: 97164- PT Re-evaluation, 97110-Therapeutic exercises, 97530- Therapeutic activity, 97112- Neuromuscular re-education, [phone]- Self Care, 60454- Gait training, [phone]- Prosthetic training, Patient/Family education, Balance training, DME instructions, Therapeutic exercises, Therapeutic activity, Neuromuscular re-education, Gait training, and Self Care  PLAN FOR NEXT SESSION: Standing with feet walk out before achieving upright position, standing tolerance, dynamic standing, continue STS from varied heights with cuing for increased anterior lean    Bitha Fauteux, Zailyn Rowser, Student-PT 06/17/2023, 3:33 PM  This entire session of physical therapy was performed under the direct supervision of PT signing evaluation /treatment. PT reviewed note and agrees.   Vladimir Faster, PT, DPT 06/17/2023, 4:17 PM

## 2023-06-18 ENCOUNTER — Encounter: Payer: Self-pay | Admitting: Family Medicine

## 2023-06-18 DIAGNOSIS — I2721 Secondary pulmonary arterial hypertension: Secondary | ICD-10-CM | POA: Diagnosis not present

## 2023-06-18 DIAGNOSIS — I5022 Chronic systolic (congestive) heart failure: Secondary | ICD-10-CM | POA: Diagnosis not present

## 2023-06-22 ENCOUNTER — Encounter: Payer: Self-pay | Admitting: Physical Therapy

## 2023-06-22 ENCOUNTER — Ambulatory Visit: Payer: PPO | Admitting: Physical Therapy

## 2023-06-22 DIAGNOSIS — G546 Phantom limb syndrome with pain: Secondary | ICD-10-CM | POA: Diagnosis not present

## 2023-06-22 DIAGNOSIS — Z89511 Acquired absence of right leg below knee: Secondary | ICD-10-CM | POA: Diagnosis not present

## 2023-06-22 DIAGNOSIS — Z7409 Other reduced mobility: Secondary | ICD-10-CM | POA: Diagnosis not present

## 2023-06-22 DIAGNOSIS — R2681 Unsteadiness on feet: Secondary | ICD-10-CM | POA: Diagnosis not present

## 2023-06-22 DIAGNOSIS — M6281 Muscle weakness (generalized): Secondary | ICD-10-CM

## 2023-06-22 DIAGNOSIS — R2689 Other abnormalities of gait and mobility: Secondary | ICD-10-CM | POA: Diagnosis not present

## 2023-06-22 NOTE — Therapy (Addendum)
OUTPATIENT PHYSICAL THERAPY PROSTHETIC TREATMENT    Patient Name: Susan [name] MRN: [id number] DOB:[birth year], 73 y.o., female Today's Date: 06/22/2023  PCP: Donita Brooks, MD  REFERRING PROVIDER: Nadara Mustard, MD   END OF SESSION:  PT End of Session - 06/22/23 1353     Visit Number 21    Number of Visits 44    Date for PT Re-Evaluation 09/10/23    Authorization Type [employer]    Progress Note Due on Visit 30    PT Start Time 1345    PT Stop Time 1430    PT Time Calculation (min) 45 min    Equipment Utilized During Treatment Gait belt    Activity Tolerance Patient tolerated treatment well;Patient limited by fatigue    Behavior During Therapy WFL for tasks assessed/performed             Past Medical History:  Diagnosis Date   Anemia    hx   Anxiety    Arthritis    "generalized" (03/15/2014)   CAD (coronary artery disease)    MI in 2000 - MI  2007 - treated bare metal stent (no nuclear since then as 9/11)   Carotid artery disease (HCC)    Chronic diastolic heart failure (HCC)    a) ECHO (08/2013) EF 55-60% and RV function nl b) RHC (08/2013) RA 4, RV 30/5/7, PA 25/10 (16), PCWP 7, Fick CO/CI 6.3/2.7, PVR 1.5 WU, PA 61 and 66%   Daily headache    "~ every other day; since I fell in June" (03/15/2014)   Depression    Diabetic retinopathy (HCC)    Dyslipidemia    ESRD (end stage renal disease) (HCC)    Dialysis on Tues Thurs Sat   Exertional shortness of breath    GERD (gastroesophageal reflux disease)    History of blood transfusion    History of kidney stones    HTN (hypertension)    Hypothyroidism    Myocardial infarction (HCC)    Obesity    Osteoarthritis    PAF (paroxysmal atrial fibrillation) (HCC)    Peripheral neuropathy    bilateral feet/hands   PONV (postoperative nausea and vomiting)    RBBB (right bundle branch block)    Old   Stroke (HCC)    mini strokes   Type II diabetes mellitus (HCC)    Type II, [name] left  upper arm. patient has omnipod insulin pump with Novolin R Insulin   Past Surgical History:  Procedure Laterality Date   A/V FISTULAGRAM Left 11/07/2022   Procedure: A/V Fistulagram;  Surgeon: Leonie Douglas, MD;  [facility]: [facility];  Service: Cardiovascular;  Laterality: Left;   ABDOMINAL HYSTERECTOMY  1980's   AMPUTATION Right 02/24/2018   Procedure: RIGHT FOOT GREAT TOE AND 2ND TOE AMPUTATION;  Surgeon: Nadara Mustard, MD;  Location: [facility] OR;  Service: Orthopedics;  Laterality: Right;   AMPUTATION Right 04/30/2018   Procedure: RIGHT TRANSMETATARSAL AMPUTATION;  Surgeon: Nadara Mustard, MD;  Location: [facility] OR;  Service: Orthopedics;  Laterality: Right;   AMPUTATION Right 05/02/2022   Procedure: RIGHT BELOW KNEE AMPUTATION;  Surgeon: Nadara Mustard, MD;  Location: [facility] OR;  Service: Orthopedics;  Laterality: Right;   APPLICATION OF WOUND VAC Right 06/13/2022   Procedure: APPLICATION OF WOUND VAC;  Surgeon: Nadara Mustard, MD;  Location: [facility] OR;  Service: Orthopedics;  Laterality: Right;   APPLICATION OF WOUND VAC Left 11/14/2022   Procedure: APPLICATION OF WOUND VAC;  Surgeon: Nadara Mustard, MD;  Location: [facility] OR;  Service: Orthopedics;  Laterality: Left;   AV FISTULA PLACEMENT Left 04/02/2022   Procedure: LEFT ARM ARTERIOVENOUS (AV) FISTULA CREATION;  Surgeon: Nada Libman, MD;  Location: [facility] OR;  Service: Vascular;  Laterality: Left;  PERIPHERAL NERVE BLOCK   AV FISTULA PLACEMENT Right 04/27/2023   Procedure: RIGHT ARM BRACHIOBASILIC ARTERIOVENOUS (AV) FISTULA CREATION;  Surgeon: Leonie Douglas, MD;  Location: [facility] OR;  Service: Vascular;  Laterality: Right;   BASCILIC VEIN TRANSPOSITION Left 07/31/2022   Procedure: LEFT ARM SECOND STAGE BASILIC VEIN TRANSPOSITION;  Surgeon: Nada Libman, MD;  Location: [facility] OR;  Service: Vascular;  Laterality: Left;   BIOPSY  05/27/2020   Procedure: BIOPSY;  Surgeon: Lanelle Bal, DO;  Location: [facility];  Service: Endoscopy;;   CATARACT  EXTRACTION, BILATERAL Bilateral ?2013   COLONOSCOPY W/ POLYPECTOMY     COLONOSCOPY WITH PROPOFOL N/A 03/13/2019   Procedure: COLONOSCOPY WITH PROPOFOL;  Surgeon: Beverley Fiedler, MD;  Location: [facility] ENDOSCOPY;  Service: Gastroenterology;  Laterality: N/A;   CORONARY ANGIOPLASTY WITH STENT PLACEMENT  1999; 2007   "1 + 1"   ERCP N/A 02/03/2022   Procedure: ENDOSCOPIC RETROGRADE CHOLANGIOPANCREATOGRAPHY (ERCP);  Surgeon: Jeani Hawking, MD;  Location: [facility] ENDOSCOPY;  Service: Gastroenterology;  Laterality: N/A;   ESOPHAGOGASTRODUODENOSCOPY N/A 02/12/2023   Procedure: ESOPHAGOGASTRODUODENOSCOPY (EGD);  Surgeon: Jeani Hawking, MD;  Location: [facility] ENDOSCOPY;  Service: Gastroenterology;  Laterality: N/A;   ESOPHAGOGASTRODUODENOSCOPY (EGD) WITH PROPOFOL N/A 03/13/2019   Procedure: ESOPHAGOGASTRODUODENOSCOPY (EGD) WITH PROPOFOL;  Surgeon: Beverley Fiedler, MD;  Location: [facility] ENDOSCOPY;  Service: Gastroenterology;  Laterality: N/A;   ESOPHAGOGASTRODUODENOSCOPY (EGD) WITH PROPOFOL N/A 05/27/2020   Procedure: ESOPHAGOGASTRODUODENOSCOPY (EGD) WITH PROPOFOL;  Surgeon: Lanelle Bal, DO;  [facility];  Service: Endoscopy;  Laterality: N/A;   ESOPHAGOGASTRODUODENOSCOPY (EGD) WITH PROPOFOL N/A 09/03/2022   Procedure: ESOPHAGOGASTRODUODENOSCOPY (EGD) WITH PROPOFOL;  Surgeon: Jeani Hawking, MD;  Location: [facility] ENDOSCOPY;  Service: Gastroenterology;  Laterality: N/A;   EYE SURGERY Bilateral    lazer   FLEXIBLE SIGMOIDOSCOPY N/A 05/23/2022   Procedure: FLEXIBLE SIGMOIDOSCOPY;  Surgeon: Jeani Hawking, MD;  Location: [facility] ENDOSCOPY;  Service: Gastroenterology;  Laterality: N/A;   FLEXIBLE SIGMOIDOSCOPY N/A 05/24/2022   Procedure: FLEXIBLE SIGMOIDOSCOPY;  Surgeon: Imogene Burn, MD;  Location: [facility] ENDOSCOPY;  Service: Gastroenterology;  Laterality: N/A;   FLEXIBLE SIGMOIDOSCOPY N/A 09/03/2022   Procedure: FLEXIBLE SIGMOIDOSCOPY;  Surgeon: Jeani Hawking, MD;  Location: [facility] ENDOSCOPY;  Service: Gastroenterology;  Laterality:  N/A;   HEMOSTASIS CLIP PLACEMENT  03/13/2019   Procedure: HEMOSTASIS CLIP PLACEMENT;  Surgeon: Beverley Fiedler, MD;  Location: [facility] ENDOSCOPY;  Service: Gastroenterology;;   HEMOSTASIS CLIP PLACEMENT  05/23/2022   Procedure: HEMOSTASIS CLIP PLACEMENT;  Surgeon: Jeani Hawking, MD;  Location: [facility] ENDOSCOPY;  Service: Gastroenterology;;   HEMOSTASIS CONTROL  05/24/2022   Procedure: HEMOSTASIS CONTROL;  Surgeon: Imogene Burn, MD;  Location: [facility] ENDOSCOPY;  Service: Gastroenterology;;   HOT HEMOSTASIS N/A 05/23/2022   Procedure: HOT HEMOSTASIS (ARGON PLASMA COAGULATION/BICAP);  Surgeon: Jeani Hawking, MD;  Location: [facility] ENDOSCOPY;  Service: Gastroenterology;  Laterality: N/A;   I & D EXTREMITY Left 05/05/2022   Procedure: IRRIGATION AND DEBRIDEMENT LEFT ARM AV FISTULA;  Surgeon: Cephus Shelling, MD;  Location: [facility] OR;  Service: Vascular;  Laterality: Left;   I & D EXTREMITY N/A 11/14/2022   Procedure: IRRIGATION AND DEBRIDEMENT OF LOWER EXTREMITY WOUND;  Surgeon: Nadara Mustard, MD;  Location: [facility] OR;  Service: Orthopedics;  Laterality: N/A;   INSERTION OF DIALYSIS CATHETER Right 04/02/2022   Procedure: INSERTION OF TUNNELED DIALYSIS CATHETER;  Surgeon: Nada Libman, MD;  Location: [facility] OR;  Service: Vascular;  Laterality: Right;   KNEE ARTHROSCOPY Left 10/25/2006   POLYPECTOMY  03/13/2019   Procedure: POLYPECTOMY;  Surgeon: Beverley Fiedler, MD;  Location: [facility] ENDOSCOPY;  Service: Gastroenterology;;   REMOVAL OF STONES  02/03/2022   Procedure: REMOVAL OF STONES;  Surgeon: Jeani Hawking, MD;  Location: [facility] ENDOSCOPY;  Service: Gastroenterology;;   REVISON OF ARTERIOVENOUS FISTULA Left 08/20/2022   Procedure: REVISON OF LEFT ARM ARTERIOVENOUS FISTULA;  Surgeon: Nada Libman, MD;  Location: [facility] OR;  Service: Vascular;  Laterality: Left;   RIGHT HEART CATH N/A 07/24/2017   Procedure: RIGHT HEART CATH;  Surgeon: Dolores Patty, MD;  [facility];  Service: Cardiovascular;  Laterality: N/A;    RIGHT HEART CATHETERIZATION N/A 09/22/2013   Procedure: RIGHT HEART CATH;  Surgeon: Dolores Patty, MD;  Location: [facility] CATH LAB;  Service: Cardiovascular;  Laterality: N/A;   SHOULDER ARTHROSCOPY WITH OPEN ROTATOR CUFF REPAIR Right 03/14/2014   Procedure: RIGHT SHOULDER ARTHROSCOPY WITH BICEPS RELEASE, OPEN SUBSCAPULA REPAIR, OPEN SUPRASPINATUS REPAIR.;  Surgeon: Cammy Copa, MD;  Location: [facility] OR;  Service: Orthopedics;  Laterality: Right;   SPHINCTEROTOMY  02/03/2022   Procedure: SPHINCTEROTOMY;  Surgeon: Jeani Hawking, MD;  Location: [facility] ENDOSCOPY;  Service: Gastroenterology;;   STUMP REVISION Right 06/13/2022   Procedure: REVISION RIGHT BELOW KNEE AMPUTATION;  Surgeon: Nadara Mustard, MD;  Location: [facility] OR;  Service: Orthopedics;  Laterality: Right;   TEE WITHOUT CARDIOVERSION N/A 02/04/2022   Procedure: TRANSESOPHAGEAL ECHOCARDIOGRAM (TEE);  Surgeon: Dolores Patty, MD;  Location: [facility] ENDOSCOPY;  Service: Cardiovascular;  Laterality: N/A;   THROMBECTOMY W/ EMBOLECTOMY Left 08/20/2022   Procedure: THROMBECTOMY OF LEFT ARM ARTERIOVENOUS FISTULA;  Surgeon: Nada Libman, MD;  Location: [facility] OR;  Service: Vascular;  Laterality: Left;   TOE AMPUTATION Right 02/24/2018   GREAT TOE AND 2ND TOE AMPUTATION   TUBAL LIGATION  1970's   Patient Active Problem List   Diagnosis Date Noted   Atrial fibrillation (HCC) 06/17/2023   Atopic dermatitis 06/17/2023   Hyperosmolar hyperglycemic state (HHS) (HCC) 02/05/2023   Gastroparesis 02/05/2023   Depression 01/29/2023   Intractable nausea and vomiting 01/20/2023   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) 04/07/2022   Allergy, unspecified,  initial encounter 04/07/2022   Dependence on renal dialysis (HCC) 04/07/2022   Gout due to renal impairment, unspecified site 04/07/2022   Hypertensive heart and chronic kidney disease with heart failure and with stage 5 chronic kidney disease, or end stage renal disease (HCC) 04/07/2022   Personal history of transient ischemic attack (TIA), and cerebral infarction without residual deficits 04/07/2022   Renal osteodystrophy 04/07/2022   Venous stasis ulcer of right calf (HCC) 03/31/2022   Fistula, colovaginal 03/26/2022   Diarrhea 03/26/2022   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   Sepsis without acute organ dysfunction (HCC)    Bacteremia    Acute pancreatitis 02/01/2022   Abdominal pain 02/01/2022   SIRS (systemic inflammatory response syndrome) (HCC) 02/01/2022   Transaminitis 02/01/2022   History of anemia due to chronic kidney disease 02/01/2022   Paroxysmal atrial fibrillation (HCC) 02/01/2022   Uncontrolled type 2 diabetes mellitus with hyperglycemia, with long-term current use of insulin (HCC) 01/14/2022   NSTEMI (non-ST  elevated myocardial infarction) (HCC) 03/05/2021   Acute renal failure superimposed on stage 4 chronic kidney disease (HCC) 08/22/2020   Hypoalbuminemia 05/25/2020   GERD (gastroesophageal reflux disease) 05/25/2020   Pressure injury of skin 05/17/2020   Acute on chronic combined systolic and diastolic congestive heart failure (HCC) 03/07/2020   Type 2 diabetes mellitus with diabetic polyneuropathy, with long-term current use of insulin (HCC) 03/07/2020   Obesity, Class III, BMI 40-49.9 (morbid obesity) (HCC) 03/07/2020   Common bile duct (CBD) obstruction 05/28/2019   Benign neoplasm of ascending colon    Benign neoplasm of transverse colon    Benign neoplasm of descending colon    Benign neoplasm of sigmoid colon    Gastric polyps    Hyperkalemia 03/11/2019   Prolonged QT interval 03/11/2019   Acute blood loss anemia 03/11/2019   Onychomycosis 06/21/2018   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)    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 (HCC)    PVD (peripheral vascular disease) (HCC) 10/26/2017   E-coli UTI 07/27/2017   Hypothyroidism 07/27/2017   AKI (acute kidney injury) (HCC)    PAH (pulmonary artery hypertension) (HCC)    Impaired ambulation 07/19/2017   Leg cramps 02/27/2017   Peripheral edema 01/12/2017   Diabetic neuropathy (HCC) 11/12/2016   CKD (chronic kidney disease), stage IV (HCC) 10/24/2015   Anemia of chronic disease 10/03/2015   Historical diagnosis of generalized anxiety disorder 10/03/2015   Secondary insomnia 10/03/2015   Acute bronchitis 09/05/2015   Hyperglycemia due to diabetes mellitus (HCC) 06/07/2015   Non compliance with medical treatment 04/17/2014   Rotator cuff tear 03/14/2014   Obesity 09/23/2013   Chronic HFrEF (heart failure with reduced ejection fraction) (HCC) 06/03/2013   Hypotension 12/25/2012   Hyponatremia 12/25/2012   Urinary incontinence    MDD (major depressive disorder), recurrent episode, moderate  (HCC) 11/12/2010   RBBB (right bundle branch block)    Wide-complex tachycardia    Coronary artery disease    Hyperlipemia 01/22/2009   Chronic hypotension 01/22/2009    ONSET DATE: 03/09/2023 MD referral to PT  REFERRING DIAG: S88.111D (ICD-10-CM) - Below-knee amputation of right lower extremity, subsequent encounter   THERAPY DIAG:  Unsteadiness on feet  Muscle weakness (generalized)  Phantom limb syndrome with pain (HCC)  Impaired functional mobility, balance, gait, and endurance  Other abnormalities of gait and mobility  Right below-knee amputee (HCC)  Rationale for Evaluation and Treatment: Rehabilitation  SUBJECTIVE:  SUBJECTIVE STATEMENT: Still has itchy spots on residual limb  but has greatly improved since last session. Has seen improvements from wiping off limb throughout the day when prosthesis is not on.     PERTINENT HISTORY: Right TTA 05/02/22, Lymphedema, idiopathic chronic venous HTN with ulcer, depression, colitis, diverticulitis, ESRD, gout, NSTEMI, DM2, polyneuropathy, PVD, CAD, right bundle branch block, mini strokes  PAIN:  Are you having pain? No pain today.   PRECAUTIONS: Fall and Other: No BP LUE  WEIGHT BEARING RESTRICTIONS: No  FALLS: Has patient fallen in last 6 months? No  LIVING ENVIRONMENT: Lives with: lives with their spouse and 2 small dogs Lives in: mobile home Home Access: Ramped entrance Home layout: One level Stairs: Yes: External: 6-7 steps; can reach both Has following equipment at home: Single point cane, Environmental consultant - 2 wheeled, Environmental consultant - 4 wheeled, Wheelchair (manual), [employer], Grab bars, and Ramped entry  OCCUPATION:  retired  PLOF: prior to amputation for ~year used RW in home & community  PATIENT GOALS:  to use prosthesis to walk, get out w/c in & out of car, get to bathroom.   OBJECTIVE:  COGNITION: Overall cognitive status:  Eval on 03/23/2023:   Within functional limits for tasks assessed  POSTURE:  Eval on  03/23/2023:  rounded shoulders, forward head, increased thoracic kyphosis, flexed trunk , and weight shift left  LOWER EXTREMITY ROM: ROM Right eval Left eval  Hip flexion    Hip extension    Hip abduction    Hip adduction    Hip internal rotation    Hip external rotation    Knee flexion    Knee extension    Ankle dorsiflexion    Ankle plantarflexion    Ankle inversion    Ankle eversion     (Blank rows = not tested)  LOWER EXTREMITY MMT:  MMT Right eval Left eval  Hip flexion 3-/5 3-/5  Hip extension 2/5 2/5  Hip abduction 2+/5 2+/5  Hip adduction    Hip internal rotation    Hip external rotation    Knee flexion 3-/5 3-/5  Knee extension 3-/5 3-/5  Ankle dorsiflexion    Ankle plantarflexion    Ankle inversion    Ankle eversion    At Evaluation all strength testing is grossly seated and functionally standing / gait. (Blank rows = not tested)  TRANSFERS: 06/15/2023: -Sit to stand transfers with RW wheelchair<>mat table at varied height: 19in with maxA to rise to stand; 22in ModA rise to stand, 24in min-modA rise to stand. -stand pivot transfer with RW w/c to mat table with modA once standing.  -Lateral scooting wc<>table at level height SBA; vc for LE positioning for optimal use of LE during transfer -lateral scoot R: modA due to fatigue and uneven surface transfer from low surface to higher surface   05/04/2023: Squat pivot transfers with modA.  04/21/2023: Pt sit to stand w/c to //bar with one hand pushing on w/c and other hand on //bar with modA first time & MinA 2nd/ 3rd time.  Best is RUE on //bar & LUE on w/c.  Stand to sit reaching LUE to w/c with minA to control descent.   PT demo & verbal cues on sit to/from stand w/c to RW.  1st time modA 2 person. 2nd time maxA one person.  Stand to sit with minA and constant cueing. Scooting transfers with minA with w/c & mat direct contact & same ht.   Eval on 03/23/2023:  Sit to stand: Max A (two-person assist) from  22" w/c to rolling  walker Stand to sit: Max A (two-person assist) rolling walker to wheelchair  FUNCTIONAL TESTs:   06/15/2023: -Static stance for standing with RW; requiring Mod-MaxA for rise to stand d/t patient unsteady on feet posterior lean without proper righting strategy, SBA for stand   04/21/2023: Pt able to stand 60 sec with BUE on //bars with supervision.  Pt able to stand for 1 min with RW support with minA.  Eval on 03/23/2023: Patient maintained upright holding rolling walker with mod assist 2 people for safety for 30 seconds.  GAIT: 06/17/2023: Patient ambulated 20ft (maximal tolerated distance) with RW ModA (2 person for safety). Required VC for LE sequencing and minA to steer walker and prevent tip over.   04/21/2023: Pt amb 5' in //bars with modA (2 person for safety) with PT verbal & tactile cues on sequence and weight shift.  Pt amb 10' with RW with maxA (2 person for safety) with PT verbal & tactile cues on sequence and weight shift.  Eval on 03/23/2023:  Patient took 2 steps (one per LE) with +2 max assist with rolling walker and TTA prosthesis.  Patient adducting prosthesis stepping on left foot with no awareness.  CURRENT PROSTHETIC WEAR ASSESSMENT: 06/17/2023:  Independence with prosthetic care:  Patient and husband are independent with: skin check, prosthetic cleaning, ply sock cleaning, proper wear schedule/adjustment Patient is dependent with: residual limb care, correct ply sock adjustment, and proper weight-bearing schedule/adjustment Donning prosthesis: husband is independent with proper donning. Patient requires MaxA. Patient can verbally direct someone how to properly assist. Doffing prosthesis: Husband is independent with proper donning. Patient requires modA. Patient can verbally direct someone how to properly assist. Prosthetic wear tolerance: majority of awake hours including dialysis hours, 1x/day, 3-4 days/week Prosthetic weight bearing  tolerance: able to tolerate of standing without complains of prosthetic limb pain. Time not limited by limb discomfort but limited by muscle fatigue.   Eval on 03/23/2023:   Patient is dependent with: skin check, residual limb care, prosthetic cleaning, ply sock cleaning, correct ply sock adjustment, proper wear schedule/adjustment, and proper weight-bearing schedule/adjustment Donning prosthesis: Max A Doffing prosthesis: Min A Prosthetic wear tolerance: 2-6 hours, 1x/day, 3-4 days/week Prosthetic weight bearing tolerance: 3 minutes with limb pain Edema: pitting edema Residual limb condition: No open areas, dry skin, normal color and temperature, cylindrical shape Prosthetic description: Silicone liner with pin lock suspension, total contact socket with flexible inner socket, sach foot  TODAY'S TREATMENT:                                                                                                                             DATE: 06/22/2023 Therex: Nustep level 5 with BLE & BUEs use then BLEs only level 4  Theract: -Sit to stand transfers with RW wc<>nu step with modA for safety and heavy vc for planning and sequencing. -STS at 24in with RW; vc for proper rise to stand and glute squeeze.  Mod-minA at [facility] to prevent tip over as well as VC for pt to push onto RW not pull backwards. -Sit to stand with RW with cone tap x4ea UE to varied cones set up on table at height of walker. VC for improved weight shift with UE on walker for additional support -sitting dynamic balance 5 cone pick up at varied distances 1 round each UE; vc for proper weight shift and counterbalance  Prosthetic training: -Ambulated 59ftt +19ft with modA (x2 for pt safety) with visual cues on RW for proper LE placement; chair follow for second bout; vc and steering/positioning of walker throughout and prevent tip over.  TREATMENT:                                                                                                                              DATE: 06/17/2023 Therex: Nustep level 5 with BLE & BUEs use then BLEs only level 4  Theract: Stand pivot R<>L with RW ModA, VC for sequencing of LE w/ walker adjustments. STS with RW at 24in, 22in, 20in x5ea height. 24in:Mod-MinA , 22in: ModA, Mod-MaxA with fatigue however controlled descent throughout. Requires min-modA to shift weight over feet for stable position and to stabilize walker to prevent tip over while patient establishes balance in standing. All trials with increased vc for leaning anteriorly before attempt to rise to stand and sequencing of UE surface<>RW.  Patient required increased rest between bouts.   Prosthetic training: -Amb 61ft (max distance tolerated) with modA (x2 for pt safety) with visual cues on RW for proper LE placement; chair follow; vc and steering/positioning of walker throughout and prevent tip over.     TREATMENT:                                                                                                                             DATE: 06/15/2023 Theract: -Sit to stand transfers with RW wheelchair<>mat table at varied height: 19in with maxA to rise to stand; 22in ModA rise to stand, 24in min-modA rise to stand. -stand pivot transfer with RW w/c to mat table with modA once standing.  -Lateral scooting wc<>table at level height SBA; vc for LE positioning for optimal use of LE during transfer -Static stance for standing with RW; requiring Mod-MaxA for rise to stand d/t patient unsteady on feet posterior lean without proper righting strategy, SBA for stand  -lateral scoot  R: modA due to fatigue and uneven surface transfer from low surface to higher surface  Prosthetic training: -Amb 60ft with modA with visual cues on RW for proper LE placement including 90* turn to position to sit for patient safety; chair follow; vc and steering/positioning of walker throughout  Education: Patient and husband  were educated on prosthetic care especially with sweating and importance of ensuring skin is dry with use of antiperspirant and lotion only at night time. Ensured patient knew importance of continuing to utilize shrinker at night when out of prosthesis. Patient also educated on weight of prosthesis to ensure dialysis has updated information.   TREATMENT:                                                                                                                             DATE: 06/10/2023 Theract: Stand pivot R/L with RW and ModA to Nustep with vc for LE placement and sequencing; Requires demo of ambulation and side stepping with RW STS with RW require demo, vc, and tc   Therex: Nustep level 4 with BLE & BUEs use then BLEs only level 3  Prosthetic training: 6ft ambulation to chair 90* turn to position to sit with modAx2 for patient safety; no chair follow; visual cues on RW for proper LE placement; vc and steering/positioning of walker throughout 4ft ambulation 90* turn to orient to path and 90* to position to sit with modAx2 to wheelchair; visual cues on RW for proper foot placement  PATIENT EDUCATION: PATIENT EDUCATED ON FOLLOWING PROSTHETIC CARE: Education details: Use of stockinette under proximal liner to decrease itching sensation, no wounds presents to PT recommended not using Vive wear under liner Skin check, Prosthetic cleaning, Propper donning, and Proper wear schedule/adjustment Prosthetic wear tolerance: 3 hours 2x/day, 4 days/week Person educated: Patient and Spouse Education method: Explanation, Tactile cues, and Verbal cues Education comprehension: verbalized understanding, verbal cues required, tactile cues required, and needs further education  HOME EXERCISE PROGRAM: Access Code: [email or web address] URL: [email or web address] Date: 04/15/2023 Prepared by: Vladimir Faster  Exercises - Supine Bridge  - 1 x daily - 4 x weekly - 2 sets - 5 reps - 2 seconds  hold - Supine Lower Trunk Rotation  - 1 x daily - 4 x weekly - 2 sets - 5 reps - 5 seconds hold - Supine Heel Slide with Strap  - 1 x daily - 4 x weekly - 2 sets - 5 reps - 2-3 seconds hold - Supine Hip Abduction  - 1 x daily - 4 x weekly - 2 sets - 5 reps - 2-3 seconds hold - Seated Hip Flexion Toward Target  - 1 x daily - 4 x weekly - 2 sets - 5 reps - 5 seconds hold - Seated Eccentric Abdominal Lean Back  - 1 x daily - 4 x weekly - 2 sets - 5 reps - 5 seconds hold - Seated Sidebending  - 1 x daily - 4  x weekly - 2 sets - 5 reps - 5 seconds hold - Seated Trunk Rotation with Crossed Arms  - 1 x daily - 4 x weekly - 2 sets - 5 reps - 5 seconds hold   ASSESSMENT:  CLINICAL IMPRESSION:  Patient demonstrated improved STS technique to rise from lower surfaces however still requires moderate VC for sequencing. Patient was able to ambulate with reduced VC with good success however required increased VC when encountering turning to sit down. Demonstrated good standing dynamic balance however still requires increased time for motor planning which make standing more taxing on patient. Patient will continue to benefit from skilled physical therapy to address deficits in order to reach safety with PLOF.   OBJECTIVE IMPAIRMENTS: Abnormal gait, decreased activity tolerance, decreased balance, decreased endurance, decreased knowledge of condition, decreased knowledge of use of DME, decreased mobility, difficulty walking, decreased ROM, decreased strength, decreased safety awareness, postural dysfunction, prosthetic dependency , and pain.   ACTIVITY LIMITATIONS: standing, transfers, and locomotion level  PARTICIPATION LIMITATIONS: community activity, household mobility and dependency / burden of care on family  PERSONAL FACTORS: Age, Fitness, Past/current experiences, Time since onset of injury/illness/exacerbation, and 3+ comorbidities: see PMH  are also affecting patient's functional outcome.   REHAB  POTENTIAL: Good  CLINICAL DECISION MAKING: Evolving/moderate complexity  EVALUATION COMPLEXITY: Moderate   GOALS: Goals reviewed with patient? Yes  SHORT TERM GOALS: Target date: 07/15/2023:  Patient & husband verbalize proper residual limb care and understanding for adjusting ply socks.  Baseline: SEE OBJECTIVE DATA Goal status:   set 06/17/2023 2.  Patient able to sit to stand w/c to rolling walker with 1 person modA and maintain upright with RW for 4 min with SBA to assist with toileting needs/hygienic cleaning.  Baseline: SEE OBJECTIVE DATA Goal status: set 06/17/2023  3. Patient ambulates 57ft including turning 90* to position to sit with RW & prosthesis with modA to navigate reaching bathroom in home.  Baseline: SEE OBJECTIVE DATA Goal status: set 06/17/2023   LONG TERM GOALS: Target date: 09/10/2023  Patient & husband demonstrates & verbalizes understanding of prosthetic care to enable safe utilization of prosthesis. Baseline: SEE OBJECTIVE DATA Goal status: On going/Partially met 06/15/2023  Patient tolerates prosthesis wear >80% of awake hours on non-dialysis days and >50% on dialysis days without skin issues and limb pain </= 4/10. Baseline: SEE OBJECTIVE DATA Goal status: MET 06/15/2023  Scooting transfers with minA and sit to/from stand transfers with modA.  Baseline: SEE OBJECTIVE DATA - scooting met but sit to stand MaxA Goal status: Partially MET 06/15/2023 / ongoing for sit to stand  Patient ambulates >39ft including turning 90* to position to sit with prosthesis & RW with modA for household navigation.  Baseline: SEE OBJECTIVE DATA Goal status: updated  06/17/2023  Patient able to maintain static stance with RW support with SBA for 5 min to assist with hygienic cleaning.  Baseline: SEE OBJECTIVE DATA Goal status:   updated 06/17/2023   PLAN:  PT FREQUENCY: 2x/week  PT DURATION: 12 weeks  PLANNED INTERVENTIONS: 97164- PT Re-evaluation, 97110-Therapeutic  exercises, 97530- Therapeutic activity, 97112- Neuromuscular re-education, [phone]- Self Care, 56387- Gait training, [phone]- Prosthetic training, Patient/Family education, Balance training, DME instructions, Therapeutic exercises, Therapeutic activity, Neuromuscular re-education, Gait training, and Self Care  PLAN FOR NEXT SESSION: STS practice with feet walk out before achieving upright position, continue dynamic balance in sitting and standing.     Fares Ramthun, Anddy Wingert, Student-PT 06/22/2023, 3:58 PM  This entire session of physical therapy was  performed under the direct supervision of PT signing evaluation /treatment. PT reviewed note and agrees.   Vladimir Faster, PT, DPT 06/22/2023, 4:54 PM

## 2023-06-23 DIAGNOSIS — D689 Coagulation defect, unspecified: Secondary | ICD-10-CM | POA: Diagnosis not present

## 2023-06-23 DIAGNOSIS — D509 Iron deficiency anemia, unspecified: Secondary | ICD-10-CM | POA: Diagnosis not present

## 2023-06-23 DIAGNOSIS — N2581 Secondary hyperparathyroidism of renal origin: Secondary | ICD-10-CM | POA: Diagnosis not present

## 2023-06-23 DIAGNOSIS — N186 End stage renal disease: Secondary | ICD-10-CM | POA: Diagnosis not present

## 2023-06-23 DIAGNOSIS — Z992 Dependence on renal dialysis: Secondary | ICD-10-CM | POA: Diagnosis not present

## 2023-06-24 ENCOUNTER — Ambulatory Visit: Payer: PPO | Admitting: Physical Therapy

## 2023-06-24 ENCOUNTER — Encounter: Payer: Self-pay | Admitting: Physical Therapy

## 2023-06-24 DIAGNOSIS — R2681 Unsteadiness on feet: Secondary | ICD-10-CM

## 2023-06-24 DIAGNOSIS — R2689 Other abnormalities of gait and mobility: Secondary | ICD-10-CM | POA: Diagnosis not present

## 2023-06-24 DIAGNOSIS — G546 Phantom limb syndrome with pain: Secondary | ICD-10-CM

## 2023-06-24 DIAGNOSIS — M6281 Muscle weakness (generalized): Secondary | ICD-10-CM

## 2023-06-24 DIAGNOSIS — Z89511 Acquired absence of right leg below knee: Secondary | ICD-10-CM

## 2023-06-24 DIAGNOSIS — Z7409 Other reduced mobility: Secondary | ICD-10-CM

## 2023-06-24 NOTE — Therapy (Addendum)
OUTPATIENT PHYSICAL THERAPY PROSTHETIC TREATMENT    Patient Name: Susan [name] MRN: [id number] DOB:[birth year], 73 y.o., female Today's Date: 06/24/2023  PCP: Donita Brooks, MD  REFERRING PROVIDER: Nadara Mustard, MD   END OF SESSION:  PT End of Session - 06/24/23 1344     Visit Number 22    Number of Visits 44    Date for PT Re-Evaluation 09/10/23    Authorization Type [employer]    Progress Note Due on Visit 30    PT Start Time 1345    PT Stop Time 1428    PT Time Calculation (min) 43 min    Equipment Utilized During Treatment Gait belt    Activity Tolerance Patient tolerated treatment well;Patient limited by fatigue    Behavior During Therapy WFL for tasks assessed/performed              Past Medical History:  Diagnosis Date   Anemia    hx   Anxiety    Arthritis    "generalized" (03/15/2014)   CAD (coronary artery disease)    MI in 2000 - MI  2007 - treated bare metal stent (no nuclear since then as 9/11)   Carotid artery disease (HCC)    Chronic diastolic heart failure (HCC)    a) ECHO (08/2013) EF 55-60% and RV function nl b) RHC (08/2013) RA 4, RV 30/5/7, PA 25/10 (16), PCWP 7, Fick CO/CI 6.3/2.7, PVR 1.5 WU, PA 61 and 66%   Daily headache    "~ every other day; since I fell in June" (03/15/2014)   Depression    Diabetic retinopathy (HCC)    Dyslipidemia    ESRD (end stage renal disease) (HCC)    Dialysis on Tues Thurs Sat   Exertional shortness of breath    GERD (gastroesophageal reflux disease)    History of blood transfusion    History of kidney stones    HTN (hypertension)    Hypothyroidism    Myocardial infarction (HCC)    Obesity    Osteoarthritis    PAF (paroxysmal atrial fibrillation) (HCC)    Peripheral neuropathy    bilateral feet/hands   PONV (postoperative nausea and vomiting)    RBBB (right bundle branch block)    Old   Stroke (HCC)    mini strokes   Type II diabetes mellitus (HCC)    Type II, [name] left  upper arm. patient has omnipod insulin pump with Novolin R Insulin   Past Surgical History:  Procedure Laterality Date   A/V FISTULAGRAM Left 11/07/2022   Procedure: A/V Fistulagram;  Surgeon: Leonie Douglas, MD;  [facility]: [facility];  Service: Cardiovascular;  Laterality: Left;   ABDOMINAL HYSTERECTOMY  1980's   AMPUTATION Right 02/24/2018   Procedure: RIGHT FOOT GREAT TOE AND 2ND TOE AMPUTATION;  Surgeon: Nadara Mustard, MD;  Location: [facility] OR;  Service: Orthopedics;  Laterality: Right;   AMPUTATION Right 04/30/2018   Procedure: RIGHT TRANSMETATARSAL AMPUTATION;  Surgeon: Nadara Mustard, MD;  Location: [facility] OR;  Service: Orthopedics;  Laterality: Right;   AMPUTATION Right 05/02/2022   Procedure: RIGHT BELOW KNEE AMPUTATION;  Surgeon: Nadara Mustard, MD;  Location: [facility] OR;  Service: Orthopedics;  Laterality: Right;   APPLICATION OF WOUND VAC Right 06/13/2022   Procedure: APPLICATION OF WOUND VAC;  Surgeon: Nadara Mustard, MD;  Location: [facility] OR;  Service: Orthopedics;  Laterality: Right;   APPLICATION OF WOUND VAC Left 11/14/2022   Procedure: APPLICATION OF WOUND  VAC;  Surgeon: Nadara Mustard, MD;  Location: [facility] OR;  Service: Orthopedics;  Laterality: Left;   AV FISTULA PLACEMENT Left 04/02/2022   Procedure: LEFT ARM ARTERIOVENOUS (AV) FISTULA CREATION;  Surgeon: Nada Libman, MD;  Location: [facility] OR;  Service: Vascular;  Laterality: Left;  PERIPHERAL NERVE BLOCK   AV FISTULA PLACEMENT Right 04/27/2023   Procedure: RIGHT ARM BRACHIOBASILIC ARTERIOVENOUS (AV) FISTULA CREATION;  Surgeon: Leonie Douglas, MD;  Location: [facility] OR;  Service: Vascular;  Laterality: Right;   BASCILIC VEIN TRANSPOSITION Left 07/31/2022   Procedure: LEFT ARM SECOND STAGE BASILIC VEIN TRANSPOSITION;  Surgeon: Nada Libman, MD;  [facility]: [facility] OR;  Service: Vascular;  Laterality: Left;   BIOPSY  05/27/2020   Procedure: BIOPSY;  Surgeon: Lanelle Bal, DO;  Location: [facility];  Service: Endoscopy;;   CATARACT  EXTRACTION, BILATERAL Bilateral ?2013   COLONOSCOPY W/ POLYPECTOMY     COLONOSCOPY WITH PROPOFOL N/A 03/13/2019   Procedure: COLONOSCOPY WITH PROPOFOL;  Surgeon: Beverley Fiedler, MD;  Location: [facility] ENDOSCOPY;  Service: Gastroenterology;  Laterality: N/A;   CORONARY ANGIOPLASTY WITH STENT PLACEMENT  1999; 2007   "1 + 1"   ERCP N/A 02/03/2022   Procedure: ENDOSCOPIC RETROGRADE CHOLANGIOPANCREATOGRAPHY (ERCP);  Surgeon: Jeani Hawking, MD;  Location: [facility] ENDOSCOPY;  Service: Gastroenterology;  Laterality: N/A;   ESOPHAGOGASTRODUODENOSCOPY N/A 02/12/2023   Procedure: ESOPHAGOGASTRODUODENOSCOPY (EGD);  Surgeon: Jeani Hawking, MD;  Location: [facility] ENDOSCOPY;  Service: Gastroenterology;  Laterality: N/A;   ESOPHAGOGASTRODUODENOSCOPY (EGD) WITH PROPOFOL N/A 03/13/2019   Procedure: ESOPHAGOGASTRODUODENOSCOPY (EGD) WITH PROPOFOL;  Surgeon: Beverley Fiedler, MD;  Location: [facility] ENDOSCOPY;  Service: Gastroenterology;  Laterality: N/A;   ESOPHAGOGASTRODUODENOSCOPY (EGD) WITH PROPOFOL N/A 05/27/2020   Procedure: ESOPHAGOGASTRODUODENOSCOPY (EGD) WITH PROPOFOL;  Surgeon: Lanelle Bal, DO;  [facility];  Service: Endoscopy;  Laterality: N/A;   ESOPHAGOGASTRODUODENOSCOPY (EGD) WITH PROPOFOL N/A 09/03/2022   Procedure: ESOPHAGOGASTRODUODENOSCOPY (EGD) WITH PROPOFOL;  Surgeon: Jeani Hawking, MD;  Location: [facility] ENDOSCOPY;  Service: Gastroenterology;  Laterality: N/A;   EYE SURGERY Bilateral    lazer   FLEXIBLE SIGMOIDOSCOPY N/A 05/23/2022   Procedure: FLEXIBLE SIGMOIDOSCOPY;  Surgeon: Jeani Hawking, MD;  Location: [facility] ENDOSCOPY;  Service: Gastroenterology;  Laterality: N/A;   FLEXIBLE SIGMOIDOSCOPY N/A 05/24/2022   Procedure: FLEXIBLE SIGMOIDOSCOPY;  Surgeon: Imogene Burn, MD;  Location: [facility] ENDOSCOPY;  Service: Gastroenterology;  Laterality: N/A;   FLEXIBLE SIGMOIDOSCOPY N/A 09/03/2022   Procedure: FLEXIBLE SIGMOIDOSCOPY;  Surgeon: Jeani Hawking, MD;  Location: [facility] ENDOSCOPY;  Service: Gastroenterology;  Laterality:  N/A;   HEMOSTASIS CLIP PLACEMENT  03/13/2019   Procedure: HEMOSTASIS CLIP PLACEMENT;  Surgeon: Beverley Fiedler, MD;  Location: [facility] ENDOSCOPY;  Service: Gastroenterology;;   HEMOSTASIS CLIP PLACEMENT  05/23/2022   Procedure: HEMOSTASIS CLIP PLACEMENT;  Surgeon: Jeani Hawking, MD;  Location: [facility] ENDOSCOPY;  Service: Gastroenterology;;   HEMOSTASIS CONTROL  05/24/2022   Procedure: HEMOSTASIS CONTROL;  Surgeon: Imogene Burn, MD;  Location: [facility] ENDOSCOPY;  Service: Gastroenterology;;   HOT HEMOSTASIS N/A 05/23/2022   Procedure: HOT HEMOSTASIS (ARGON PLASMA COAGULATION/BICAP);  Surgeon: Jeani Hawking, MD;  Location: [facility] ENDOSCOPY;  Service: Gastroenterology;  Laterality: N/A;   I & D EXTREMITY Left 05/05/2022   Procedure: IRRIGATION AND DEBRIDEMENT LEFT ARM AV FISTULA;  Surgeon: Cephus Shelling, MD;  Location: [facility] OR;  Service: Vascular;  Laterality: Left;   I & D EXTREMITY N/A 11/14/2022   Procedure: IRRIGATION AND DEBRIDEMENT OF LOWER EXTREMITY WOUND;  Surgeon: Nadara Mustard, MD;  Location: [facility]  OR;  Service: Orthopedics;  Laterality: N/A;   INSERTION OF DIALYSIS CATHETER Right 04/02/2022   Procedure: INSERTION OF TUNNELED DIALYSIS CATHETER;  Surgeon: Nada Libman, MD;  Location: [facility] OR;  Service: Vascular;  Laterality: Right;   KNEE ARTHROSCOPY Left 10/25/2006   POLYPECTOMY  03/13/2019   Procedure: POLYPECTOMY;  Surgeon: Beverley Fiedler, MD;  Location[facility] ENDOSCOPY;  Service: Gastroenterology;;   REMOVAL OF STONES  02/03/2022   Procedure: REMOVAL OF STONES;  Surgeon: Jeani Hawking, MD;  Location: [facility] ENDOSCOPY;  Service: Gastroenterology;;   REVISON OF ARTERIOVENOUS FISTULA Left 08/20/2022   Procedure: REVISON OF LEFT ARM ARTERIOVENOUS FISTULA;  Surgeon: Nada Libman, MD;  Location: [facility] OR;  Service: Vascular;  Laterality: Left;   RIGHT HEART CATH N/A 07/24/2017   Procedure: RIGHT HEART CATH;  Surgeon: Dolores Patty, MD;  [facility];  Service: Cardiovascular;  Laterality: N/A;    RIGHT HEART CATHETERIZATION N/A 09/22/2013   Procedure: RIGHT HEART CATH;  Surgeon: Dolores Patty, MD;  Location: [facility] CATH LAB;  Service: Cardiovascular;  Laterality: N/A;   SHOULDER ARTHROSCOPY WITH OPEN ROTATOR CUFF REPAIR Right 03/14/2014   Procedure: RIGHT SHOULDER ARTHROSCOPY WITH BICEPS RELEASE, OPEN SUBSCAPULA REPAIR, OPEN SUPRASPINATUS REPAIR.;  Surgeon: Cammy Copa, MD;  Location: [facility] OR;  Service: Orthopedics;  Laterality: Right;   SPHINCTEROTOMY  02/03/2022   Procedure: SPHINCTEROTOMY;  Surgeon: Jeani Hawking, MD;  Location: [facility] ENDOSCOPY;  Service: Gastroenterology;;   STUMP REVISION Right 06/13/2022   Procedure: REVISION RIGHT BELOW KNEE AMPUTATION;  Surgeon: Nadara Mustard, MD;  Location: [facility] OR;  Service: Orthopedics;  Laterality: Right;   TEE WITHOUT CARDIOVERSION N/A 02/04/2022   Procedure: TRANSESOPHAGEAL ECHOCARDIOGRAM (TEE);  Surgeon: Dolores Patty, MD;  Location: [facility] ENDOSCOPY;  Service: Cardiovascular;  Laterality: N/A;   THROMBECTOMY W/ EMBOLECTOMY Left 08/20/2022   Procedure: THROMBECTOMY OF LEFT ARM ARTERIOVENOUS FISTULA;  Surgeon: Nada Libman, MD;  Location: [facility] OR;  Service: Vascular;  Laterality: Left;   TOE AMPUTATION Right 02/24/2018   GREAT TOE AND 2ND TOE AMPUTATION   TUBAL LIGATION  1970's   Patient Active Problem List   Diagnosis Date Noted   Atrial fibrillation (HCC) 06/17/2023   Atopic dermatitis 06/17/2023   Hyperosmolar hyperglycemic state (HHS) (HCC) 02/05/2023   Gastroparesis 02/05/2023   Depression 01/29/2023   Intractable nausea and vomiting 01/20/2023   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) 04/07/2022  Allergy, unspecified, initial encounter 04/07/2022   Dependence on renal dialysis (HCC) 04/07/2022   Gout due to renal impairment, unspecified site 04/07/2022   Hypertensive heart and chronic kidney disease with heart failure and with stage 5 chronic kidney disease, or end stage renal disease (HCC) 04/07/2022   Personal history of transient ischemic attack (TIA), and cerebral infarction without residual deficits 04/07/2022   Renal osteodystrophy 04/07/2022   Venous stasis ulcer of right calf (HCC) 03/31/2022   Fistula, colovaginal 03/26/2022   Diarrhea 03/26/2022   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   Sepsis without acute organ dysfunction (HCC)    Bacteremia    Acute pancreatitis 02/01/2022   Abdominal pain 02/01/2022   SIRS (systemic inflammatory response syndrome) (HCC) 02/01/2022   Transaminitis 02/01/2022   History of anemia due to chronic kidney disease 02/01/2022   Paroxysmal atrial fibrillation (HCC) 02/01/2022   Uncontrolled type 2 diabetes mellitus with hyperglycemia, with long-term current use of insulin (HCC) 01/14/2022   NSTEMI (non-ST  elevated myocardial infarction) (HCC) 03/05/2021   Acute renal failure superimposed on stage 4 chronic kidney disease (HCC) 08/22/2020   Hypoalbuminemia 05/25/2020   GERD (gastroesophageal reflux disease) 05/25/2020   Pressure injury of skin 05/17/2020   Acute on chronic combined systolic and diastolic congestive heart failure (HCC) 03/07/2020   Type 2 diabetes mellitus with diabetic polyneuropathy, with long-term current use of insulin (HCC) 03/07/2020   Obesity, Class III, BMI 40-49.9 (morbid obesity) (HCC) 03/07/2020   Common bile duct (CBD) obstruction 05/28/2019   Benign neoplasm of ascending colon    Benign neoplasm of transverse colon    Benign neoplasm of descending colon    Benign neoplasm of sigmoid colon    Gastric polyps    Hyperkalemia 03/11/2019   Prolonged QT interval 03/11/2019   Acute blood loss anemia 03/11/2019   Onychomycosis 06/21/2018   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)    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 (HCC)    PVD (peripheral vascular disease) (HCC) 10/26/2017   E-coli UTI 07/27/2017   Hypothyroidism 07/27/2017   AKI (acute kidney injury) (HCC)    PAH (pulmonary artery hypertension) (HCC)    Impaired ambulation 07/19/2017   Leg cramps 02/27/2017   Peripheral edema 01/12/2017   Diabetic neuropathy (HCC) 11/12/2016   CKD (chronic kidney disease), stage IV (HCC) 10/24/2015   Anemia of chronic disease 10/03/2015   Historical diagnosis of generalized anxiety disorder 10/03/2015   Secondary insomnia 10/03/2015   Acute bronchitis 09/05/2015   Hyperglycemia due to diabetes mellitus (HCC) 06/07/2015   Non compliance with medical treatment 04/17/2014   Rotator cuff tear 03/14/2014   Obesity 09/23/2013   Chronic HFrEF (heart failure with reduced ejection fraction) (HCC) 06/03/2013   Hypotension 12/25/2012   Hyponatremia 12/25/2012   Urinary incontinence    MDD (major depressive disorder), recurrent episode, moderate  (HCC) 11/12/2010   RBBB (right bundle branch block)    Wide-complex tachycardia    Coronary artery disease    Hyperlipemia 01/22/2009   Chronic hypotension 01/22/2009    ONSET DATE: 03/09/2023 MD referral to PT  REFERRING DIAG: S88.111D (ICD-10-CM) - Below-knee amputation of right lower extremity, subsequent encounter   THERAPY DIAG:  Unsteadiness on feet  Muscle weakness (generalized)  Phantom limb syndrome with pain (HCC)  Impaired functional mobility, balance, gait, and endurance  Right below-knee amputee (HCC)  Other abnormalities of gait and mobility  Rationale for Evaluation and Treatment: Rehabilitation  SUBJECTIVE:  SUBJECTIVE STATEMENT: Has had great improvement with  skin issues. Denies pain.   Husband, Joe present at session.   PERTINENT HISTORY: Right TTA 05/02/22, Lymphedema, idiopathic chronic venous HTN with ulcer, depression, colitis, diverticulitis, ESRD, gout, NSTEMI, DM2, polyneuropathy, PVD, CAD, right bundle branch block, mini strokes  PAIN:  Are you having pain? No pain today.   PRECAUTIONS: Fall and Other: No BP LUE  WEIGHT BEARING RESTRICTIONS: No  FALLS: Has patient fallen in last 6 months? No  LIVING ENVIRONMENT: Lives with: lives with their spouse and 2 small dogs Lives in: mobile home Home Access: Ramped entrance Home layout: One level Stairs: Yes: External: 6-7 steps; can reach both Has following equipment at home: Single point cane, Environmental consultant - 2 wheeled, Environmental consultant - 4 wheeled, Wheelchair (manual), [employer], Grab bars, and Ramped entry  OCCUPATION:  retired  PLOF: prior to amputation for ~year used RW in home & community  PATIENT GOALS:  to use prosthesis to walk, get out w/c in & out of car, get to bathroom.   OBJECTIVE:  COGNITION: Overall cognitive status:  Eval on 03/23/2023:   Within functional limits for tasks assessed  POSTURE:  Eval on 03/23/2023:  rounded shoulders, forward head, increased thoracic kyphosis, flexed trunk  , and weight shift left  LOWER EXTREMITY ROM: ROM Right eval Left eval  Hip flexion    Hip extension    Hip abduction    Hip adduction    Hip internal rotation    Hip external rotation    Knee flexion    Knee extension    Ankle dorsiflexion    Ankle plantarflexion    Ankle inversion    Ankle eversion     (Blank rows = not tested)  LOWER EXTREMITY MMT:  MMT Right eval Left eval  Hip flexion 3-/5 3-/5  Hip extension 2/5 2/5  Hip abduction 2+/5 2+/5  Hip adduction    Hip internal rotation    Hip external rotation    Knee flexion 3-/5 3-/5  Knee extension 3-/5 3-/5  Ankle dorsiflexion    Ankle plantarflexion    Ankle inversion    Ankle eversion    At Evaluation all strength testing is grossly seated and functionally standing / gait. (Blank rows = not tested)  TRANSFERS: 06/15/2023: -Sit to stand transfers with RW wheelchair<>mat table at varied height: 19in with maxA to rise to stand; 22in ModA rise to stand, 24in min-modA rise to stand. -stand pivot transfer with RW w/c to mat table with modA once standing.  -Lateral scooting wc<>table at level height SBA; vc for LE positioning for optimal use of LE during transfer -lateral scoot R: modA due to fatigue and uneven surface transfer from low surface to higher surface   05/04/2023: Squat pivot transfers with modA.  04/21/2023: Pt sit to stand w/c to //bar with one hand pushing on w/c and other hand on //bar with modA first time & MinA 2nd/ 3rd time.  Best is RUE on //bar & LUE on w/c.  Stand to sit reaching LUE to w/c with minA to control descent.   PT demo & verbal cues on sit to/from stand w/c to RW.  1st time modA 2 person. 2nd time maxA one person.  Stand to sit with minA and constant cueing. Scooting transfers with minA with w/c & mat direct contact & same ht.   Eval on 03/23/2023:  Sit to stand: Max A (two-person assist) from 22" w/c to rolling walker Stand to sit: Max A (two-person assist) rolling walker to  wheelchair  FUNCTIONAL TESTs:   06/15/2023: -Static stance for standing with RW; requiring Mod-MaxA for rise to stand d/t patient unsteady on feet posterior lean without proper righting strategy, SBA for stand   04/21/2023: Pt able to stand 60 sec with BUE on //bars with supervision.  Pt able to stand for 1 min with RW support with minA.  Eval on 03/23/2023: Patient maintained upright holding rolling walker with mod assist 2 people for safety for 30 seconds.  GAIT: 06/17/2023: Patient ambulated 66ft (maximal tolerated distance) with RW ModA (2 person for safety). Required VC for LE sequencing and minA to steer walker and prevent tip over.   04/21/2023: Pt amb 5' in //bars with modA (2 person for safety) with PT verbal & tactile cues on sequence and weight shift.  Pt amb 10' with RW with maxA (2 person for safety) with PT verbal & tactile cues on sequence and weight shift.  Eval on 03/23/2023:  Patient took 2 steps (one per LE) with +2 max assist with rolling walker and TTA prosthesis.  Patient adducting prosthesis stepping on left foot with no awareness.  CURRENT PROSTHETIC WEAR ASSESSMENT: 06/17/2023:  Independence with prosthetic care:  Patient and husband are independent with: skin check, prosthetic cleaning, ply sock cleaning, proper wear schedule/adjustment Patient is dependent with: residual limb care, correct ply sock adjustment, and proper weight-bearing schedule/adjustment Donning prosthesis: husband is independent with proper donning. Patient requires MaxA. Patient can verbally direct someone how to properly assist. Doffing prosthesis: Husband is independent with proper donning. Patient requires modA. Patient can verbally direct someone how to properly assist. Prosthetic wear tolerance: majority of awake hours including dialysis hours, 1x/day, 3-4 days/week Prosthetic weight bearing tolerance: able to tolerate of standing without complains of prosthetic limb pain.  Time not limited by limb discomfort but limited by muscle fatigue.   Eval on 03/23/2023:   Patient is dependent with: skin check, residual limb care, prosthetic cleaning, ply sock cleaning, correct ply sock adjustment, proper wear schedule/adjustment, and proper weight-bearing schedule/adjustment Donning prosthesis: Max A Doffing prosthesis: Min A Prosthetic wear tolerance: 2-6 hours, 1x/day, 3-4 days/week Prosthetic weight bearing tolerance: 3 minutes with limb pain Edema: pitting edema Residual limb condition: No open areas, dry skin, normal color and temperature, cylindrical shape Prosthetic description: Silicone liner with pin lock suspension, total contact socket with flexible inner socket, sach foot  TODAY'S TREATMENT:                                                                                                                             DATE: 06/24/2023 Theract: -STS from wc with LE walk back requiring Max-ModA for patient to rise to stand; vc for sequencing; modA to prevent walker tipover -Dynamic sitting balance with 6 blazepods at varied distances; 5 rounds of 12 taps, rest between bouts; vc for proper weight shift and counterbalance along with proper UE usage to tap blazepod -Raised table 24"<>wc: use of RW to  take steps to surfaces then 90 and 180deg turns to position self in front of chair. Required increased VC while turning for sequencing. Had 2 instances of LOB requiring therapist intervention (modA) to remain upright. When turning to sit, patient required increased VC to unload prosthesis before sitting down.   TREATMENT:                                                                                                                             DATE: 06/22/2023 Therex: Nustep level 5 with BLE & BUEs use then BLEs only level 4  Theract: -Sit to stand transfers with RW wc<>nu step with modA for safety and heavy vc for planning and sequencing. -STS at 24in with  RW; vc for proper rise to stand and glute squeeze. Mod-minA at [facility] to prevent tip over as well as VC for pt to push onto RW not pull backwards. -Sit to stand with RW with cone tap x4ea UE to varied cones set up on table at height of walker. VC for improved weight shift with UE on walker for additional support -sitting dynamic balance 5 cone pick up at varied distances 1 round each UE; vc for proper weight shift and counterbalance  Prosthetic training: -Ambulated 83ftt +41ft with modA (x2 for pt safety) with visual cues on RW for proper LE placement; chair follow for second bout; vc and steering/positioning of walker throughout and prevent tip over.  TREATMENT:                                                                                                                             DATE: 06/17/2023 Therex: Nustep level 5 with BLE & BUEs use then BLEs only level 4  Theract: Stand pivot R<>L with RW ModA, VC for sequencing of LE w/ walker adjustments. STS with RW at 24in, 22in, 20in x5ea height. 24in:Mod-MinA , 22in: ModA, Mod-MaxA with fatigue however controlled descent throughout. Requires min-modA to shift weight over feet for stable position and to stabilize walker to prevent tip over while patient establishes balance in standing. All trials with increased vc for leaning anteriorly before attempt to rise to stand and sequencing of UE surface<>RW.  Patient required increased rest between bouts.   Prosthetic training: -Amb 29ft (max distance tolerated) with modA (x2 for pt safety) with visual cues on RW for proper LE placement; chair follow; vc and steering/positioning of  walker throughout and prevent tip over.     TREATMENT:                                                                                                                             DATE: 06/15/2023 Theract: -Sit to stand transfers with RW wheelchair<>mat table at varied height: 19in with maxA to rise to stand; 22in ModA  rise to stand, 24in min-modA rise to stand. -stand pivot transfer with RW w/c to mat table with modA once standing.  -Lateral scooting wc<>table at level height SBA; vc for LE positioning for optimal use of LE during transfer -Static stance for standing with RW; requiring Mod-MaxA for rise to stand d/t patient unsteady on feet posterior lean without proper righting strategy, SBA for stand  -lateral scoot R: modA due to fatigue and uneven surface transfer from low surface to higher surface  Prosthetic training: -Amb 29ft with modA with visual cues on RW for proper LE placement including 90* turn to position to sit for patient safety; chair follow; vc and steering/positioning of walker throughout  Education: Patient and husband were educated on prosthetic care especially with sweating and importance of ensuring skin is dry with use of antiperspirant and lotion only at night time. Ensured patient knew importance of continuing to utilize shrinker at night when out of prosthesis. Patient also educated on weight of prosthesis to ensure dialysis has updated information.   TREATMENT:                                                                                                                             DATE: 06/10/2023 Theract: Stand pivot R/L with RW and ModA to Nustep with vc for LE placement and sequencing; Requires demo of ambulation and side stepping with RW STS with RW require demo, vc, and tc   Therex: Nustep level 4 with BLE & BUEs use then BLEs only level 3  Prosthetic training: 15ft ambulation to chair 90* turn to position to sit with modAx2 for patient safety; no chair follow; visual cues on RW for proper LE placement; vc and steering/positioning of walker throughout 66ft ambulation 90* turn to orient to path and 90* to position to sit with modAx2 to wheelchair; visual cues on RW for proper foot placement  PATIENT EDUCATION: PATIENT EDUCATED ON FOLLOWING PROSTHETIC  CARE: Education details: Use of stockinette under proximal liner to decrease itching  sensation, no wounds presents to PT recommended not using Vive wear under liner Skin check, Prosthetic cleaning, Propper donning, and Proper wear schedule/adjustment Prosthetic wear tolerance: 3 hours 2x/day, 4 days/week Person educated: Patient and Spouse Education method: Explanation, Tactile cues, and Verbal cues Education comprehension: verbalized understanding, verbal cues required, tactile cues required, and needs further education  HOME EXERCISE PROGRAM: Access Code: [email or web address] URL: [email or web address] Date: 04/15/2023 Prepared by: Vladimir Faster  Exercises - Supine Bridge  - 1 x daily - 4 x weekly - 2 sets - 5 reps - 2 seconds hold - Supine Lower Trunk Rotation  - 1 x daily - 4 x weekly - 2 sets - 5 reps - 5 seconds hold - Supine Heel Slide with Strap  - 1 x daily - 4 x weekly - 2 sets - 5 reps - 2-3 seconds hold - Supine Hip Abduction  - 1 x daily - 4 x weekly - 2 sets - 5 reps - 2-3 seconds hold - Seated Hip Flexion Toward Target  - 1 x daily - 4 x weekly - 2 sets - 5 reps - 5 seconds hold - Seated Eccentric Abdominal Lean Back  - 1 x daily - 4 x weekly - 2 sets - 5 reps - 5 seconds hold - Seated Sidebending  - 1 x daily - 4 x weekly - 2 sets - 5 reps - 5 seconds hold - Seated Trunk Rotation with Crossed Arms  - 1 x daily - 4 x weekly - 2 sets - 5 reps - 5 seconds hold   ASSESSMENT:  CLINICAL IMPRESSION:  Patient demonstrated improved seated dynamic balance with blazepods at distance that challenged patient while ensuring patient could reach. Patient was able to progress to reaching across midline for counterbalance while weight shifting. Patient continues to demonstrate deficits in gait, strength, and balance as evident throughout the session. Patient will continue to benefit from skilled physical therapy to address deficits in order to reach safety with PLOF.   OBJECTIVE  IMPAIRMENTS: Abnormal gait, decreased activity tolerance, decreased balance, decreased endurance, decreased knowledge of condition, decreased knowledge of use of DME, decreased mobility, difficulty walking, decreased ROM, decreased strength, decreased safety awareness, postural dysfunction, prosthetic dependency , and pain.   ACTIVITY LIMITATIONS: standing, transfers, and locomotion level  PARTICIPATION LIMITATIONS: community activity, household mobility and dependency / burden of care on family  PERSONAL FACTORS: Age, Fitness, Past/current experiences, Time since onset of injury/illness/exacerbation, and 3+ comorbidities: see PMH  are also affecting patient's functional outcome.   REHAB POTENTIAL: Good  CLINICAL DECISION MAKING: Evolving/moderate complexity  EVALUATION COMPLEXITY: Moderate   GOALS: Goals reviewed with patient? Yes  SHORT TERM GOALS: Target date: 07/15/2023:  Patient & husband verbalize proper residual limb care and understanding for adjusting ply socks.  Baseline: SEE OBJECTIVE DATA Goal status:   set 06/17/2023 2.  Patient able to sit to stand w/c to rolling walker with 1 person modA and maintain upright with RW for 4 min with SBA to assist with toileting needs/hygienic cleaning.  Baseline: SEE OBJECTIVE DATA Goal status: set 06/17/2023  3. Patient ambulates 70ft including turning 90* to position to sit with RW & prosthesis with modA to navigate reaching bathroom in home.  Baseline: SEE OBJECTIVE DATA Goal status: set 06/17/2023   LONG TERM GOALS: Target date: 09/10/2023  Patient & husband demonstrates & verbalizes understanding of prosthetic care to enable safe utilization of prosthesis. Baseline: SEE OBJECTIVE DATA Goal status: On going/Partially met 06/15/2023  Patient tolerates prosthesis wear >  80% of awake hours on non-dialysis days and >50% on dialysis days without skin issues and limb pain </= 4/10. Baseline: SEE OBJECTIVE DATA Goal status: MET  06/15/2023  Scooting transfers with minA and sit to/from stand transfers with modA.  Baseline: SEE OBJECTIVE DATA - scooting met but sit to stand MaxA Goal status: Partially MET 06/15/2023 / ongoing for sit to stand  Patient ambulates >36ft including turning 90* to position to sit with prosthesis & RW with modA for household navigation.  Baseline: SEE OBJECTIVE DATA Goal status: updated  06/17/2023  Patient able to maintain static stance with RW support with SBA for 5 min to assist with hygienic cleaning.  Baseline: SEE OBJECTIVE DATA Goal status:   updated 06/17/2023   PLAN:  PT FREQUENCY: 2x/week  PT DURATION: 12 weeks  PLANNED INTERVENTIONS: 97164- PT Re-evaluation, 97110-Therapeutic exercises, 97530- Therapeutic activity, 97112- Neuromuscular re-education, [phone]- Self Care, 60454- Gait training, [phone]- Prosthetic training, Patient/Family education, Balance training, DME instructions, Therapeutic exercises, Therapeutic activity, Neuromuscular re-education, Gait training, and Self Care  PLAN FOR NEXT SESSION: continue STS practice with feet walk out before achieving upright position from wheelchair, continue dynamic balance in sitting progressing to farther reaching, also attempt standing dynamic balance with blazepods/cones.     Otisha Spickler, Dahlia Nifong, Student-PT 06/24/2023, 4:36 PM  This entire session of physical therapy was performed under the direct supervision of PT signing evaluation /treatment. PT reviewed note and agrees.   Vladimir Faster, PT, DPT 06/24/2023, 4:47 PM

## 2023-06-25 DIAGNOSIS — J9611 Chronic respiratory failure with hypoxia: Secondary | ICD-10-CM | POA: Diagnosis not present

## 2023-06-25 DIAGNOSIS — I5043 Acute on chronic combined systolic (congestive) and diastolic (congestive) heart failure: Secondary | ICD-10-CM | POA: Diagnosis not present

## 2023-06-25 DIAGNOSIS — K219 Gastro-esophageal reflux disease without esophagitis: Secondary | ICD-10-CM | POA: Diagnosis not present

## 2023-06-25 DIAGNOSIS — I5042 Chronic combined systolic (congestive) and diastolic (congestive) heart failure: Secondary | ICD-10-CM | POA: Diagnosis not present

## 2023-06-25 DIAGNOSIS — M6281 Muscle weakness (generalized): Secondary | ICD-10-CM | POA: Diagnosis not present

## 2023-06-25 DIAGNOSIS — K626 Ulcer of anus and rectum: Secondary | ICD-10-CM | POA: Diagnosis not present

## 2023-06-26 ENCOUNTER — Encounter: Payer: Self-pay | Admitting: Podiatry

## 2023-06-26 ENCOUNTER — Ambulatory Visit (INDEPENDENT_AMBULATORY_CARE_PROVIDER_SITE_OTHER): Payer: PPO | Admitting: Podiatry

## 2023-06-26 DIAGNOSIS — B351 Tinea unguium: Secondary | ICD-10-CM

## 2023-06-26 DIAGNOSIS — Z992 Dependence on renal dialysis: Secondary | ICD-10-CM | POA: Diagnosis not present

## 2023-06-26 DIAGNOSIS — E1151 Type 2 diabetes mellitus with diabetic peripheral angiopathy without gangrene: Secondary | ICD-10-CM | POA: Diagnosis not present

## 2023-06-26 DIAGNOSIS — N186 End stage renal disease: Secondary | ICD-10-CM | POA: Diagnosis not present

## 2023-06-26 DIAGNOSIS — S88111D Complete traumatic amputation at level between knee and ankle, right lower leg, subsequent encounter: Secondary | ICD-10-CM

## 2023-06-26 DIAGNOSIS — M79675 Pain in left toe(s): Secondary | ICD-10-CM | POA: Diagnosis not present

## 2023-06-26 DIAGNOSIS — N179 Acute kidney failure, unspecified: Secondary | ICD-10-CM | POA: Diagnosis not present

## 2023-06-26 DIAGNOSIS — L84 Corns and callosities: Secondary | ICD-10-CM

## 2023-06-26 NOTE — Progress Notes (Signed)
Subjective:   Patient ID: Susan [name], female   DOB: 73 y.o.   MRN: [id number]   HPI Patient presents in wheelchair with caregiver with history of amputation of the right lower leg with discoloration of the left digits thick toenail deformities and lesion formation of the second and third toes.  Did have infection right toes and ended up losing the leg and concerned about this and does not smoke and is on dialysis not active   Review of Systems  All other systems reviewed and are negative.       Objective:  Physical Exam Vitals and nursing note reviewed.  Constitutional:      Appearance: She is well-developed.  Pulmonary:     Effort: Pulmonary effort is normal.  Musculoskeletal:        General: Normal range of motion.  Skin:    General: Skin is warm.  Neurological:     Mental Status: She is alert.     Vascular status left intact with diminishment due to significant systemic disease vascular status and diabetes with thick nailbeds 1-5 left and also lesion second and third toe crusted over painful with patient also on blood thinner     Assessment:  Poor health individual with thick nailbeds and also lesion formation second and third with high risk factors     Plan:  H&P reviewed debrided the nails on the left 1-5 and debrided lesions x 2 no iatrogenic bleeding discussed daily inspections do not see breakdown of tissue so not concerned about other pathology currently

## 2023-06-27 DIAGNOSIS — D689 Coagulation defect, unspecified: Secondary | ICD-10-CM | POA: Diagnosis not present

## 2023-06-27 DIAGNOSIS — N2581 Secondary hyperparathyroidism of renal origin: Secondary | ICD-10-CM | POA: Diagnosis not present

## 2023-06-27 DIAGNOSIS — Z992 Dependence on renal dialysis: Secondary | ICD-10-CM | POA: Diagnosis not present

## 2023-06-27 DIAGNOSIS — N186 End stage renal disease: Secondary | ICD-10-CM | POA: Diagnosis not present

## 2023-06-29 ENCOUNTER — Encounter: Payer: Self-pay | Admitting: Physical Therapy

## 2023-06-29 ENCOUNTER — Ambulatory Visit: Payer: PPO | Admitting: Physical Therapy

## 2023-06-29 ENCOUNTER — Other Ambulatory Visit: Payer: Self-pay

## 2023-06-29 DIAGNOSIS — N186 End stage renal disease: Secondary | ICD-10-CM

## 2023-06-29 DIAGNOSIS — M6281 Muscle weakness (generalized): Secondary | ICD-10-CM | POA: Diagnosis not present

## 2023-06-29 DIAGNOSIS — R059 Cough, unspecified: Secondary | ICD-10-CM | POA: Diagnosis not present

## 2023-06-29 DIAGNOSIS — R2681 Unsteadiness on feet: Secondary | ICD-10-CM | POA: Diagnosis not present

## 2023-06-29 DIAGNOSIS — G546 Phantom limb syndrome with pain: Secondary | ICD-10-CM | POA: Diagnosis not present

## 2023-06-29 DIAGNOSIS — J22 Unspecified acute lower respiratory infection: Secondary | ICD-10-CM | POA: Diagnosis not present

## 2023-06-29 DIAGNOSIS — Z7409 Other reduced mobility: Secondary | ICD-10-CM

## 2023-06-29 DIAGNOSIS — R2689 Other abnormalities of gait and mobility: Secondary | ICD-10-CM | POA: Diagnosis not present

## 2023-06-29 NOTE — Therapy (Signed)
OUTPATIENT PHYSICAL THERAPY PROSTHETIC TREATMENT    Patient Name: Susan [name] MRN: [id number] DOB:03-02-1951, 73 y.o., female Today's Date: 06/29/2023  PCP: Donita Brooks, MD  REFERRING PROVIDER: Nadara Mustard, MD   END OF SESSION:  PT End of Session - 06/29/23 1259     Visit Number 23    Number of Visits 44    Date for PT Re-Evaluation 09/10/23    Authorization Type [employer]    Progress Note Due on Visit 30    PT Start Time 1259    PT Stop Time 1345    PT Time Calculation (min) 46 min    Equipment Utilized During Treatment Gait belt    Activity Tolerance Patient tolerated treatment well;Patient limited by fatigue    Behavior During Therapy WFL for tasks assessed/performed               Past Medical History:  Diagnosis Date   Anemia    hx   Anxiety    Arthritis    "generalized" (03/15/2014)   CAD (coronary artery disease)    MI in 2000 - MI  2007 - treated bare metal stent (no nuclear since then as 9/11)   Carotid artery disease (HCC)    Chronic diastolic heart failure (HCC)    a) ECHO (08/2013) EF 55-60% and RV function nl b) RHC (08/2013) RA 4, RV 30/5/7, PA 25/10 (16), PCWP 7, Fick CO/CI 6.3/2.7, PVR 1.5 WU, PA 61 and 66%   Daily headache    "~ every other day; since I fell in June" (03/15/2014)   Depression    Diabetic retinopathy (HCC)    Dyslipidemia    ESRD (end stage renal disease) (HCC)    Dialysis on Tues Thurs Sat   Exertional shortness of breath    GERD (gastroesophageal reflux disease)    History of blood transfusion    History of kidney stones    HTN (hypertension)    Hypothyroidism    Myocardial infarction (HCC)    Obesity    Osteoarthritis    PAF (paroxysmal atrial fibrillation) (HCC)    Peripheral neuropathy    bilateral feet/hands   PONV (postoperative nausea and vomiting)    RBBB (right bundle branch block)    Old   Stroke (HCC)    mini strokes   Type II diabetes mellitus (HCC)    Type II, [name]  left upper arm. patient has omnipod insulin pump with Novolin R Insulin   Past Surgical History:  Procedure Laterality Date   A/V FISTULAGRAM Left 11/07/2022   Procedure: A/V Fistulagram;  Surgeon: Leonie Douglas, MD;  [facility]: [facility];  Service: Cardiovascular;  Laterality: Left;   ABDOMINAL HYSTERECTOMY  1980's   AMPUTATION Right 02/24/2018   Procedure: RIGHT FOOT GREAT TOE AND 2ND TOE AMPUTATION;  Surgeon: Nadara Mustard, MD;  Location: [facility] OR;  Service: Orthopedics;  Laterality: Right;   AMPUTATION Right 04/30/2018   Procedure: RIGHT TRANSMETATARSAL AMPUTATION;  Surgeon: Nadara Mustard, MD;  Location: [facility] OR;  Service: Orthopedics;  Laterality: Right;   AMPUTATION Right 05/02/2022   Procedure: RIGHT BELOW KNEE AMPUTATION;  Surgeon: Nadara Mustard, MD;  Location: [facility] OR;  Service: Orthopedics;  Laterality: Right;   APPLICATION OF WOUND VAC Right 06/13/2022   Procedure: APPLICATION OF WOUND VAC;  Surgeon: Nadara Mustard, MD;  Location: [facility] OR;  Service: Orthopedics;  Laterality: Right;   APPLICATION OF WOUND VAC Left 11/14/2022   Procedure: APPLICATION OF  WOUND VAC;  Surgeon: Nadara Mustard, MD;  Location: [facility] OR;  Service: Orthopedics;  Laterality: Left;   AV FISTULA PLACEMENT Left 04/02/2022   Procedure: LEFT ARM ARTERIOVENOUS (AV) FISTULA CREATION;  Surgeon: Nada Libman, MD;  Location: [facility] OR;  Service: Vascular;  Laterality: Left;  PERIPHERAL NERVE BLOCK   AV FISTULA PLACEMENT Right 04/27/2023   Procedure: RIGHT ARM BRACHIOBASILIC ARTERIOVENOUS (AV) FISTULA CREATION;  Surgeon: Leonie Douglas, MD;  Location: [facility] OR;  Service: Vascular;  Laterality: Right;   BASCILIC VEIN TRANSPOSITION Left 07/31/2022   Procedure: LEFT ARM SECOND STAGE BASILIC VEIN TRANSPOSITION;  Surgeon: Nada Libman, MD;  Location: [facility] OR;  Service: Vascular;  Laterality: Left;   BIOPSY  05/27/2020   Procedure: BIOPSY;  Surgeon: Lanelle Bal, DO;  Location: [facility];  Service: Endoscopy;;   CATARACT  EXTRACTION, BILATERAL Bilateral ?2013   COLONOSCOPY W/ POLYPECTOMY     COLONOSCOPY WITH PROPOFOL N/A 03/13/2019   Procedure: COLONOSCOPY WITH PROPOFOL;  Surgeon: Beverley Fiedler, MD;  Location: [facility] ENDOSCOPY;  Service: Gastroenterology;  Laterality: N/A;   CORONARY ANGIOPLASTY WITH STENT PLACEMENT  1999; 2007   "1 + 1"   ERCP N/A 02/03/2022   Procedure: ENDOSCOPIC RETROGRADE CHOLANGIOPANCREATOGRAPHY (ERCP);  Surgeon: Jeani Hawking, MD;  Location: [facility] ENDOSCOPY;  Service: Gastroenterology;  Laterality: N/A;   ESOPHAGOGASTRODUODENOSCOPY N/A 02/12/2023   Procedure: ESOPHAGOGASTRODUODENOSCOPY (EGD);  Surgeon: Jeani Hawking, MD;  Location: [facility] ENDOSCOPY;  Service: Gastroenterology;  Laterality: N/A;   ESOPHAGOGASTRODUODENOSCOPY (EGD) WITH PROPOFOL N/A 03/13/2019   Procedure: ESOPHAGOGASTRODUODENOSCOPY (EGD) WITH PROPOFOL;  Surgeon: Beverley Fiedler, MD;  Location: [facility] ENDOSCOPY;  Service: Gastroenterology;  Laterality: N/A;   ESOPHAGOGASTRODUODENOSCOPY (EGD) WITH PROPOFOL N/A 05/27/2020   Procedure: ESOPHAGOGASTRODUODENOSCOPY (EGD) WITH PROPOFOL;  Surgeon: Lanelle Bal, DO;  [facility];  Service: Endoscopy;  Laterality: N/A;   ESOPHAGOGASTRODUODENOSCOPY (EGD) WITH PROPOFOL N/A 09/03/2022   Procedure: ESOPHAGOGASTRODUODENOSCOPY (EGD) WITH PROPOFOL;  Surgeon: Jeani Hawking, MD;  Location: [facility] ENDOSCOPY;  Service: Gastroenterology;  Laterality: N/A;   EYE SURGERY Bilateral    lazer   FLEXIBLE SIGMOIDOSCOPY N/A 05/23/2022   Procedure: FLEXIBLE SIGMOIDOSCOPY;  Surgeon: Jeani Hawking, MD;  Location: [facility] ENDOSCOPY;  Service: Gastroenterology;  Laterality: N/A;   FLEXIBLE SIGMOIDOSCOPY N/A 05/24/2022   Procedure: FLEXIBLE SIGMOIDOSCOPY;  Surgeon: Imogene Burn, MD;  Location: [facility] ENDOSCOPY;  Service: Gastroenterology;  Laterality: N/A;   FLEXIBLE SIGMOIDOSCOPY N/A 09/03/2022   Procedure: FLEXIBLE SIGMOIDOSCOPY;  Surgeon: Jeani Hawking, MD;  Location: [facility] ENDOSCOPY;  Service: Gastroenterology;  Laterality:  N/A;   HEMOSTASIS CLIP PLACEMENT  03/13/2019   Procedure: HEMOSTASIS CLIP PLACEMENT;  Surgeon: Beverley Fiedler, MD;  Location: [facility] ENDOSCOPY;  Service: Gastroenterology;;   HEMOSTASIS CLIP PLACEMENT  05/23/2022   Procedure: HEMOSTASIS CLIP PLACEMENT;  Surgeon: Jeani Hawking, MD;  Location: [facility] ENDOSCOPY;  Service: Gastroenterology;;   HEMOSTASIS CONTROL  05/24/2022   Procedure: HEMOSTASIS CONTROL;  Surgeon: Imogene Burn, MD;  Location: [facility] ENDOSCOPY;  Service: Gastroenterology;;   HOT HEMOSTASIS N/A 05/23/2022   Procedure: HOT HEMOSTASIS (ARGON PLASMA COAGULATION/BICAP);  Surgeon: Jeani Hawking, MD;  Location: [facility] ENDOSCOPY;  Service: Gastroenterology;  Laterality: N/A;   I & D EXTREMITY Left 05/05/2022   Procedure: IRRIGATION AND DEBRIDEMENT LEFT ARM AV FISTULA;  Surgeon: Cephus Shelling, MD;  Location: [facility] OR;  Service: Vascular;  Laterality: Left;   I & D EXTREMITY N/A 11/14/2022   Procedure: IRRIGATION AND DEBRIDEMENT OF LOWER EXTREMITY WOUND;  Surgeon: Nadara Mustard, MD;  Location:  [facility] OR;  Service: Orthopedics;  Laterality: N/A;   INSERTION OF DIALYSIS CATHETER Right 04/02/2022   Procedure: INSERTION OF TUNNELED DIALYSIS CATHETER;  Surgeon: Nada Libman, MD;  Location: [facility] OR;  Service: Vascular;  Laterality: Right;   KNEE ARTHROSCOPY Left 10/25/2006   POLYPECTOMY  03/13/2019   Procedure: POLYPECTOMY;  Surgeon: Beverley Fiedler, MD;  Location: [facility] ENDOSCOPY;  Service: Gastroenterology;;   REMOVAL OF STONES  02/03/2022   Procedure: REMOVAL OF STONES;  Surgeon: Jeani Hawking, MD;  Location: [facility] ENDOSCOPY;  Service: Gastroenterology;;   REVISON OF ARTERIOVENOUS FISTULA Left 08/20/2022   Procedure: REVISON OF LEFT ARM ARTERIOVENOUS FISTULA;  Surgeon: Nada Libman, MD;  Location: [facility] OR;  Service: Vascular;  Laterality: Left;   RIGHT HEART CATH N/A 07/24/2017   Procedure: RIGHT HEART CATH;  Surgeon: Dolores Patty, MD;  [facility]: [facility];  Service: Cardiovascular;  Laterality: N/A;    RIGHT HEART CATHETERIZATION N/A 09/22/2013   Procedure: RIGHT HEART CATH;  Surgeon: Dolores Patty, MD;  Location: [facility] CATH LAB;  Service: Cardiovascular;  Laterality: N/A;   SHOULDER ARTHROSCOPY WITH OPEN ROTATOR CUFF REPAIR Right 03/14/2014   Procedure: RIGHT SHOULDER ARTHROSCOPY WITH BICEPS RELEASE, OPEN SUBSCAPULA REPAIR, OPEN SUPRASPINATUS REPAIR.;  Surgeon: Cammy Copa, MD;  Location: [facility] OR;  Service: Orthopedics;  Laterality: Right;   SPHINCTEROTOMY  02/03/2022   Procedure: SPHINCTEROTOMY;  Surgeon: Jeani Hawking, MD;  Location: [facility] ENDOSCOPY;  Service: Gastroenterology;;   STUMP REVISION Right 06/13/2022   Procedure: REVISION RIGHT BELOW KNEE AMPUTATION;  Surgeon: Nadara Mustard, MD;  Location: [facility] OR;  Service: Orthopedics;  Laterality: Right;   TEE WITHOUT CARDIOVERSION N/A 02/04/2022   Procedure: TRANSESOPHAGEAL ECHOCARDIOGRAM (TEE);  Surgeon: Dolores Patty, MD;  Location: [facility] ENDOSCOPY;  Service: Cardiovascular;  Laterality: N/A;   THROMBECTOMY W/ EMBOLECTOMY Left 08/20/2022   Procedure: THROMBECTOMY OF LEFT ARM ARTERIOVENOUS FISTULA;  Surgeon: Nada Libman, MD;  Location: [facility] OR;  Service: Vascular;  Laterality: Left;   TOE AMPUTATION Right 02/24/2018   GREAT TOE AND 2ND TOE AMPUTATION   TUBAL LIGATION  1970's   Patient Active Problem List   Diagnosis Date Noted   Atrial fibrillation (HCC) 06/17/2023   Atopic dermatitis 06/17/2023   Hyperosmolar hyperglycemic state (HHS) (HCC) 02/05/2023   Gastroparesis 02/05/2023   Depression 01/29/2023   Intractable nausea and vomiting 01/20/2023   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) 04/07/2022  Allergy, unspecified, initial encounter 04/07/2022   Dependence on renal dialysis (HCC) 04/07/2022   Gout due to renal impairment, unspecified site 04/07/2022   Hypertensive heart and chronic kidney disease with heart failure and with stage 5 chronic kidney disease, or end stage renal disease (HCC) 04/07/2022   Personal history of transient ischemic attack (TIA), and cerebral infarction without residual deficits 04/07/2022   Renal osteodystrophy 04/07/2022   Venous stasis ulcer of right calf (HCC) 03/31/2022   Fistula, colovaginal 03/26/2022   Diarrhea 03/26/2022   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   Sepsis without acute organ dysfunction (HCC)    Bacteremia    Acute pancreatitis 02/01/2022   Abdominal pain 02/01/2022   SIRS (systemic inflammatory response syndrome) (HCC) 02/01/2022   Transaminitis 02/01/2022   History of anemia due to chronic kidney disease 02/01/2022   Paroxysmal atrial fibrillation (HCC) 02/01/2022   Uncontrolled type 2 diabetes mellitus with hyperglycemia, with long-term current use of insulin (HCC) 01/14/2022   NSTEMI (non-ST  elevated myocardial infarction) (HCC) 03/05/2021   Acute renal failure superimposed on stage 4 chronic kidney disease (HCC) 08/22/2020   Hypoalbuminemia 05/25/2020   GERD (gastroesophageal reflux disease) 05/25/2020   Pressure injury of skin 05/17/2020   Acute on chronic combined systolic and diastolic congestive heart failure (HCC) 03/07/2020   Type 2 diabetes mellitus with diabetic polyneuropathy, with long-term current use of insulin (HCC) 03/07/2020   Obesity, Class III, BMI 40-49.9 (morbid obesity) (HCC) 03/07/2020   Common bile duct (CBD) obstruction 05/28/2019   Benign neoplasm of ascending colon    Benign neoplasm of transverse colon    Benign neoplasm of descending colon    Benign neoplasm of sigmoid colon    Gastric polyps    Hyperkalemia 03/11/2019   Prolonged QT interval 03/11/2019   Acute blood loss anemia 03/11/2019   Onychomycosis 06/21/2018   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)    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 (HCC)    PVD (peripheral vascular disease) (HCC) 10/26/2017   E-coli UTI 07/27/2017   Hypothyroidism 07/27/2017   AKI (acute kidney injury) (HCC)    PAH (pulmonary artery hypertension) (HCC)    Impaired ambulation 07/19/2017   Leg cramps 02/27/2017   Peripheral edema 01/12/2017   Diabetic neuropathy (HCC) 11/12/2016   CKD (chronic kidney disease), stage IV (HCC) 10/24/2015   Anemia of chronic disease 10/03/2015   Historical diagnosis of generalized anxiety disorder 10/03/2015   Secondary insomnia 10/03/2015   Acute bronchitis 09/05/2015   Hyperglycemia due to diabetes mellitus (HCC) 06/07/2015   Non compliance with medical treatment 04/17/2014   Rotator cuff tear 03/14/2014   Obesity 09/23/2013   Chronic HFrEF (heart failure with reduced ejection fraction) (HCC) 06/03/2013   Hypotension 12/25/2012   Hyponatremia 12/25/2012   Urinary incontinence    MDD (major depressive disorder), recurrent episode, moderate  (HCC) 11/12/2010   RBBB (right bundle branch block)    Wide-complex tachycardia    Coronary artery disease    Hyperlipemia 01/22/2009   Chronic hypotension 01/22/2009    ONSET DATE: 03/09/2023 MD referral to PT  REFERRING DIAG: S88.111D (ICD-10-CM) - Below-knee amputation of right lower extremity, subsequent encounter   THERAPY DIAG:  Unsteadiness on feet  Muscle weakness (generalized)  Phantom limb syndrome with pain (HCC)  Impaired functional mobility, balance, gait, and endurance  Other abnormalities of gait and mobility  Rationale for Evaluation and Treatment: Rehabilitation  SUBJECTIVE:  SUBJECTIVE STATEMENT: She has cough that started last night.  She wears  the prosthesis to dialysis but they still use a lift for transfers.  Her husband reports scooting transfers are easier with patient helping more.   Husband, Joe present at session.   PERTINENT HISTORY: Right TTA 05/02/22, Lymphedema, idiopathic chronic venous HTN with ulcer, depression, colitis, diverticulitis, ESRD, gout, NSTEMI, DM2, polyneuropathy, PVD, CAD, right bundle branch block, mini strokes  PAIN:  Are you having pain? No pain today.   PRECAUTIONS: Fall and Other: No BP LUE  WEIGHT BEARING RESTRICTIONS: No  FALLS: Has patient fallen in last 6 months? No  LIVING ENVIRONMENT: Lives with: lives with their spouse and 2 small dogs Lives in: mobile home Home Access: Ramped entrance Home layout: One level Stairs: Yes: External: 6-7 steps; can reach both Has following equipment at home: Single point cane, Environmental consultant - 2 wheeled, Environmental consultant - 4 wheeled, Wheelchair (manual), [employer], Grab bars, and Ramped entry  OCCUPATION:  retired  PLOF: prior to amputation for ~year used RW in home & community  PATIENT GOALS:  to use prosthesis to walk, get out w/c in & out of car, get to bathroom.   OBJECTIVE:  COGNITION: Overall cognitive status:  Eval on 03/23/2023:   Within functional limits for tasks  assessed  POSTURE:  Eval on 03/23/2023:  rounded shoulders, forward head, increased thoracic kyphosis, flexed trunk , and weight shift left  LOWER EXTREMITY ROM: ROM Right eval Left eval  Hip flexion    Hip extension    Hip abduction    Hip adduction    Hip internal rotation    Hip external rotation    Knee flexion    Knee extension    Ankle dorsiflexion    Ankle plantarflexion    Ankle inversion    Ankle eversion     (Blank rows = not tested)  LOWER EXTREMITY MMT:  MMT Right eval Left eval  Hip flexion 3-/5 3-/5  Hip extension 2/5 2/5  Hip abduction 2+/5 2+/5  Hip adduction    Hip internal rotation    Hip external rotation    Knee flexion 3-/5 3-/5  Knee extension 3-/5 3-/5  Ankle dorsiflexion    Ankle plantarflexion    Ankle inversion    Ankle eversion    At Evaluation all strength testing is grossly seated and functionally standing / gait. (Blank rows = not tested)  TRANSFERS: 06/15/2023: -Sit to stand transfers with RW wheelchair<>mat table at varied height: 19in with maxA to rise to stand; 22in ModA rise to stand, 24in min-modA rise to stand. -stand pivot transfer with RW w/c to mat table with modA once standing.  -Lateral scooting wc<>table at level height SBA; vc for LE positioning for optimal use of LE during transfer -lateral scoot R: modA due to fatigue and uneven surface transfer from low surface to higher surface   05/04/2023: Squat pivot transfers with modA.  04/21/2023: Pt sit to stand w/c to //bar with one hand pushing on w/c and other hand on //bar with modA first time & MinA 2nd/ 3rd time.  Best is RUE on //bar & LUE on w/c.  Stand to sit reaching LUE to w/c with minA to control descent.   PT demo & verbal cues on sit to/from stand w/c to RW.  1st time modA 2 person. 2nd time maxA one person.  Stand to sit with minA and constant cueing. Scooting transfers with minA with w/c & mat direct contact & same ht.   Eval on 03/23/2023:  Sit to stand:  Max A (  two-person assist) from 22" w/c to rolling walker Stand to sit: Max A (two-person assist) rolling walker to wheelchair  FUNCTIONAL TESTs:   06/15/2023: -Static stance for standing with RW; requiring Mod-MaxA for rise to stand d/t patient unsteady on feet posterior lean without proper righting strategy, SBA for stand   04/21/2023: Pt able to stand 60 sec with BUE on //bars with supervision.  Pt able to stand for 1 min with RW support with minA.  Eval on 03/23/2023: Patient maintained upright holding rolling walker with mod assist 2 people for safety for 30 seconds.  GAIT: 06/17/2023: Patient ambulated 61ft (maximal tolerated distance) with RW ModA (2 person for safety). Required VC for LE sequencing and minA to steer walker and prevent tip over.   04/21/2023: Pt amb 5' in //bars with modA (2 person for safety) with PT verbal & tactile cues on sequence and weight shift.  Pt amb 10' with RW with maxA (2 person for safety) with PT verbal & tactile cues on sequence and weight shift.  Eval on 03/23/2023:  Patient took 2 steps (one per LE) with +2 max assist with rolling walker and TTA prosthesis.  Patient adducting prosthesis stepping on left foot with no awareness.  CURRENT PROSTHETIC WEAR ASSESSMENT: 06/17/2023:  Independence with prosthetic care:  Patient and husband are independent with: skin check, prosthetic cleaning, ply sock cleaning, proper wear schedule/adjustment Patient is dependent with: residual limb care, correct ply sock adjustment, and proper weight-bearing schedule/adjustment Donning prosthesis: husband is independent with proper donning. Patient requires MaxA. Patient can verbally direct someone how to properly assist. Doffing prosthesis: Husband is independent with proper donning. Patient requires modA. Patient can verbally direct someone how to properly assist. Prosthetic wear tolerance: majority of awake hours including dialysis hours, 1x/day, 3-4  days/week Prosthetic weight bearing tolerance: able to tolerate of standing without complains of prosthetic limb pain. Time not limited by limb discomfort but limited by muscle fatigue.   Eval on 03/23/2023:   Patient is dependent with: skin check, residual limb care, prosthetic cleaning, ply sock cleaning, correct ply sock adjustment, proper wear schedule/adjustment, and proper weight-bearing schedule/adjustment Donning prosthesis: Max A Doffing prosthesis: Min A Prosthetic wear tolerance: 2-6 hours, 1x/day, 3-4 days/week Prosthetic weight bearing tolerance: 3 minutes with limb pain Edema: pitting edema Residual limb condition: No open areas, dry skin, normal color and temperature, cylindrical shape Prosthetic description: Silicone liner with pin lock suspension, total contact socket with flexible inner socket, sach foot    TODAY'S TREATMENT:                                                                                                                             DATE: 06/29/2023 Therapeutic Activities: -STS 22" w/c to RW with modA with verbal cues.  From 18" chair with armrest to RW with modA. -stand pivot transfer with RW with minA once upright.  Static stance 1 min with RW support turning head right/left  and up/down 3 reps each. -PT used internet to discuss transport w/c which could be used as 2nd chair in home that has armrests and is lighter for husband's protection. The negative is typically armrests are not removable so will not be able to do scooting transfer.  Pt & husband verbalize understanding.   Prosthetic Training with TTA Prosthesis: -pt amb 10' X 2 with 90* to position to sit with RW with modA 1 person (2nd person close for safety) without w/c following. PT cues on postioning RW & turning to position. Visual cues on RW with verbal cues for step width.   -pt amb 5' X 2 with RW with 90* turn to right to position to sit with her husband providing primary assist and PT CGA  for safety.  PT provided ~2/3 of cues needed.     TREATMENT:                                                                                                                             DATE: 06/24/2023 Theract: -STS from wc with LE walk back requiring Max-ModA for patient to rise to stand; vc for sequencing; modA to prevent walker tipover -Dynamic sitting balance with 6 blazepods at varied distances; 5 rounds of 12 taps, rest between bouts; vc for proper weight shift and counterbalance along with proper UE usage to tap blazepod -Raised table 24"<>wc: use of RW to take steps to surfaces then 90 and 180deg turns to position self in front of chair. Required increased VC while turning for sequencing. Had 2 instances of LOB requiring therapist intervention (modA) to remain upright. When turning to sit, patient required increased VC to unload prosthesis before sitting down.     TREATMENT:                                                                                                                             DATE: 06/22/2023 Therex: Nustep level 5 with BLE & BUEs use then BLEs only level 4  Theract: -Sit to stand transfers with RW wc<>nu step with modA for safety and heavy vc for planning and sequencing. -STS at 24in with RW; vc for proper rise to stand and glute squeeze. Mod-minA at RW to prevent tip over as well as VC for pt to push onto RW not pull backwards. -Sit to stand with RW with cone tap x4ea UE to varied cones  set up on table at height of walker. VC for improved weight shift with UE on walker for additional support -sitting dynamic balance 5 cone pick up at varied distances 1 round each UE; vc for proper weight shift and counterbalance  Prosthetic training: -Ambulated 54ftt +67ft with modA (x2 for pt safety) with visual cues on RW for proper LE placement; chair follow for second bout; vc and steering/positioning of walker throughout and prevent tip over.  TREATMENT:                                                                                                                              DATE: 06/17/2023 Therex: Nustep level 5 with BLE & BUEs use then BLEs only level 4  Theract: Stand pivot R<>L with RW ModA, VC for sequencing of LE w/ walker adjustments. STS with RW at 24in, 22in, 20in x5ea height. 24in:Mod-MinA , 22in: ModA, Mod-MaxA with fatigue however controlled descent throughout. Requires min-modA to shift weight over feet for stable position and to stabilize walker to prevent tip over while patient establishes balance in standing. All trials with increased vc for leaning anteriorly before attempt to rise to stand and sequencing of UE surface<>RW.  Patient required increased rest between bouts.   Prosthetic training: -Amb 83ft (max distance tolerated) with modA (x2 for pt safety) with visual cues on RW for proper LE placement; chair follow; vc and steering/positioning of walker throughout and prevent tip over.       PATIENT EDUCATION: PATIENT EDUCATED ON FOLLOWING PROSTHETIC CARE: Education details: Use of stockinette under proximal liner to decrease itching sensation, no wounds presents to PT recommended not using Vive wear under liner Skin check, Prosthetic cleaning, Propper donning, and Proper wear schedule/adjustment Prosthetic wear tolerance: 3 hours 2x/day, 4 days/week Person educated: Patient and Spouse Education method: Explanation, Tactile cues, and Verbal cues Education comprehension: verbalized understanding, verbal cues required, tactile cues required, and needs further education  HOME EXERCISE PROGRAM: Access Code: [email or web address] URL: [email or web address] Date: 04/15/2023 Prepared by: Vladimir Faster  Exercises - Supine Bridge  - 1 x daily - 4 x weekly - 2 sets - 5 reps - 2 seconds hold - Supine Lower Trunk Rotation  - 1 x daily - 4 x weekly - 2 sets - 5 reps - 5 seconds hold - Supine Heel Slide with Strap  - 1 x daily  - 4 x weekly - 2 sets - 5 reps - 2-3 seconds hold - Supine Hip Abduction  - 1 x daily - 4 x weekly - 2 sets - 5 reps - 2-3 seconds hold - Seated Hip Flexion Toward Target  - 1 x daily - 4 x weekly - 2 sets - 5 reps - 5 seconds hold - Seated Eccentric Abdominal Lean Back  - 1 x daily - 4 x weekly - 2 sets - 5 reps - 5 seconds hold - Seated Sidebending  - 1 x daily - 4 x  weekly - 2 sets - 5 reps - 5 seconds hold - Seated Trunk Rotation with Crossed Arms  - 1 x daily - 4 x weekly - 2 sets - 5 reps - 5 seconds hold   ASSESSMENT:  CLINICAL IMPRESSION:  PT session focusing on activities to progress towards gait & standing with her husband.  PT was able to ambulate with patient with one person assist but 2nd person close for safety.    Patient will continue to benefit from skilled physical therapy to address deficits in order to reach safety with PLOF.   OBJECTIVE IMPAIRMENTS: Abnormal gait, decreased activity tolerance, decreased balance, decreased endurance, decreased knowledge of condition, decreased knowledge of use of DME, decreased mobility, difficulty walking, decreased ROM, decreased strength, decreased safety awareness, postural dysfunction, prosthetic dependency , and pain.   ACTIVITY LIMITATIONS: standing, transfers, and locomotion level  PARTICIPATION LIMITATIONS: community activity, household mobility and dependency / burden of care on family  PERSONAL FACTORS: Age, Fitness, Past/current experiences, Time since onset of injury/illness/exacerbation, and 3+ comorbidities: see PMH  are also affecting patient's functional outcome.   REHAB POTENTIAL: Good  CLINICAL DECISION MAKING: Evolving/moderate complexity  EVALUATION COMPLEXITY: Moderate   GOALS: Goals reviewed with patient? Yes  SHORT TERM GOALS: Target date: 07/15/2023:  Patient & husband verbalize proper residual limb care and understanding for adjusting ply socks.  Baseline: SEE OBJECTIVE DATA Goal status:   set  06/17/2023 2.  Patient able to sit to stand w/c to rolling walker with 1 person modA and maintain upright with RW for 4 min with SBA to assist with toileting needs/hygienic cleaning.  Baseline: SEE OBJECTIVE DATA Goal status: set 06/17/2023  3. Patient ambulates 13ft including turning 90* to position to sit with RW & prosthesis with modA to navigate reaching bathroom in home.  Baseline: SEE OBJECTIVE DATA Goal status: set 06/17/2023   LONG TERM GOALS: Target date: 09/10/2023  Patient & husband demonstrates & verbalizes understanding of prosthetic care to enable safe utilization of prosthesis. Baseline: SEE OBJECTIVE DATA Goal status: On going/Partially met 06/15/2023  Patient tolerates prosthesis wear >80% of awake hours on non-dialysis days and >50% on dialysis days without skin issues and limb pain </= 4/10. Baseline: SEE OBJECTIVE DATA Goal status: MET 06/15/2023  Scooting transfers with minA and sit to/from stand transfers with modA.  Baseline: SEE OBJECTIVE DATA - scooting met but sit to stand MaxA Goal status: Partially MET 06/15/2023 / ongoing for sit to stand  Patient ambulates >85ft including turning 90* to position to sit with prosthesis & RW with modA for household navigation.  Baseline: SEE OBJECTIVE DATA Goal status: updated  06/17/2023  Patient able to maintain static stance with RW support with SBA for 5 min to assist with hygienic cleaning.  Baseline: SEE OBJECTIVE DATA Goal status:   updated 06/17/2023   PLAN:  PT FREQUENCY: 2x/week  PT DURATION: 12 weeks  PLANNED INTERVENTIONS: 97164- PT Re-evaluation, 97110-Therapeutic exercises, 97530- Therapeutic activity, 97112- Neuromuscular re-education, [phone]- Self Care, 60454- Gait training, [phone]- Prosthetic training, Patient/Family education, Balance training, DME instructions, Therapeutic exercises, Therapeutic activity, Neuromuscular re-education, Gait training, and Self Care  PLAN FOR NEXT SESSION:   continue work on  transfers including sit to stand and gait between 2 chairs.  Include her husband for assist for education & building confidence for home in the future. Try stand pivot transfer for car.     Vladimir Faster, PT, DPT 06/29/2023, 2:31 PM

## 2023-06-30 ENCOUNTER — Other Ambulatory Visit: Payer: Self-pay | Admitting: Family Medicine

## 2023-06-30 DIAGNOSIS — N2581 Secondary hyperparathyroidism of renal origin: Secondary | ICD-10-CM | POA: Diagnosis not present

## 2023-06-30 DIAGNOSIS — D509 Iron deficiency anemia, unspecified: Secondary | ICD-10-CM | POA: Diagnosis not present

## 2023-06-30 DIAGNOSIS — Z992 Dependence on renal dialysis: Secondary | ICD-10-CM | POA: Diagnosis not present

## 2023-06-30 DIAGNOSIS — N186 End stage renal disease: Secondary | ICD-10-CM | POA: Diagnosis not present

## 2023-06-30 DIAGNOSIS — F339 Major depressive disorder, recurrent, unspecified: Secondary | ICD-10-CM

## 2023-06-30 DIAGNOSIS — D689 Coagulation defect, unspecified: Secondary | ICD-10-CM | POA: Diagnosis not present

## 2023-06-30 MED ORDER — CLONAZEPAM 0.5 MG PO TBDP: 0.5000 mg | ORAL_TABLET | Freq: Two times a day (BID) | ORAL | 1 refills | Status: DC

## 2023-06-30 NOTE — Telephone Encounter (Signed)
Last Fill: 01/19/23  Last OV: 06/17/23 Next OV: 07/06/23  Routing to provider for review/authorization.

## 2023-06-30 NOTE — Telephone Encounter (Signed)
 Copied from CRM [id number]. Topic: Clinical - Medication Refill >> Jun 30, 2023  9:03 AM [name] wrote: Most Recent Primary Care Visit:  Provider: ALETHA BENE  Department: [facility]  Visit Type: ACUTE  Date: 06/17/2023  Medication: clonazePAM  (KLONOPIN ) 0.5 MG disintegrating tablet  Has the patient contacted their pharmacy? Yes (Agent: If no, request that the patient contact the pharmacy for the refill. If patient does not wish to contact the pharmacy document the reason why and proceed with request.) (Agent: If yes, when and what did the pharmacy advise?) Patient stated she has contacted the pharmacy 3 different times now and last night they advised her she needed to contact her provider   Is this the correct pharmacy for this prescription? Yes If no, delete pharmacy and type the correct one.  This is the patient's preferred pharmacy:  [id number] [address], [place] - [address] [place] [address] Phone: [phone] Fax: [phone]   Has the prescription been filled recently? No: last time filled was 01/19/23  Is the patient out of the medication? Yes  Has the patient been seen for an appointment in the last year OR does the patient have an upcoming appointment? Yes  Can we respond through MyChart? No  Agent: Please be advised that Rx refills may take up to 3 business days. We ask that you follow-up with your pharmacy.

## 2023-07-01 ENCOUNTER — Ambulatory Visit: Payer: PPO | Admitting: Physical Therapy

## 2023-07-01 ENCOUNTER — Encounter: Payer: Self-pay | Admitting: Physical Therapy

## 2023-07-01 DIAGNOSIS — Z89511 Acquired absence of right leg below knee: Secondary | ICD-10-CM

## 2023-07-01 DIAGNOSIS — R2681 Unsteadiness on feet: Secondary | ICD-10-CM | POA: Diagnosis not present

## 2023-07-01 DIAGNOSIS — Z7409 Other reduced mobility: Secondary | ICD-10-CM | POA: Diagnosis not present

## 2023-07-01 DIAGNOSIS — R2689 Other abnormalities of gait and mobility: Secondary | ICD-10-CM

## 2023-07-01 DIAGNOSIS — M6281 Muscle weakness (generalized): Secondary | ICD-10-CM | POA: Diagnosis not present

## 2023-07-01 DIAGNOSIS — G546 Phantom limb syndrome with pain: Secondary | ICD-10-CM | POA: Diagnosis not present

## 2023-07-01 NOTE — Therapy (Addendum)
 OUTPATIENT PHYSICAL THERAPY PROSTHETIC TREATMENT    Patient Name: Susan [name] MRN: [id number] DOB:1950-07-19, 73 y.o., female Today's Date: 07/01/2023  PCP: Duanne Butler DASEN, MD  REFERRING PROVIDER: Harden Jerona GAILS, MD   END OF SESSION:  PT End of Session - 07/01/23 1525     Visit Number 24    Number of Visits 44    Date for PT Re-Evaluation 09/10/23    Authorization Type [employer]    Progress Note Due on Visit 30    PT Start Time 1300    PT Stop Time 1345    PT Time Calculation (min) 45 min    Equipment Utilized During Treatment Gait belt    Activity Tolerance Patient tolerated treatment well;Patient limited by fatigue    Behavior During Therapy WFL for tasks assessed/performed            Past Medical History:  Diagnosis Date   Anemia    hx   Anxiety    Arthritis    generalized (03/15/2014)   CAD (coronary artery disease)    MI in 2000 - MI  2007 - treated bare metal stent (no nuclear since then as 9/11)   Carotid artery disease (HCC)    Chronic diastolic heart failure (HCC)    a) ECHO (08/2013) EF 55-60% and RV function nl b) RHC (08/2013) RA 4, RV 30/5/7, PA 25/10 (16), PCWP 7, Fick CO/CI 6.3/2.7, PVR 1.5 WU, PA 61 and 66%   Daily headache    ~ every other day; since I fell in June (03/15/2014)   Depression    Diabetic retinopathy (HCC)    Dyslipidemia    ESRD (end stage renal disease) (HCC)    Dialysis on Tues Thurs Sat   Exertional shortness of breath    GERD (gastroesophageal reflux disease)    History of blood transfusion    History of kidney stones    HTN (hypertension)    Hypothyroidism    Myocardial infarction (HCC)    Obesity    Osteoarthritis    PAF (paroxysmal atrial fibrillation) (HCC)    Peripheral neuropathy    bilateral feet/hands   PONV (postoperative nausea and vomiting)    RBBB (right bundle branch block)    Old   Stroke (HCC)    mini strokes   Type II diabetes mellitus (HCC)    Type II, [place] libre left  upper arm. patient has omnipod insulin  pump with Novolin R Insulin    Past Surgical History:  Procedure Laterality Date   A/V FISTULAGRAM Left 11/07/2022   Procedure: A/V Fistulagram;  Surgeon: Magda Debby SAILOR, MD;  [facility]: [facility];  Service: Cardiovascular;  Laterality: Left;   ABDOMINAL HYSTERECTOMY  1980's   AMPUTATION Right 02/24/2018   Procedure: RIGHT FOOT GREAT TOE AND 2ND TOE AMPUTATION;  Surgeon: Harden Jerona GAILS, MD;  Location: [facility] OR;  Service: Orthopedics;  Laterality: Right;   AMPUTATION Right 04/30/2018   Procedure: RIGHT TRANSMETATARSAL AMPUTATION;  Surgeon: Harden Jerona GAILS, MD;  Location: [facility] OR;  Service: Orthopedics;  Laterality: Right;   AMPUTATION Right 05/02/2022   Procedure: RIGHT BELOW KNEE AMPUTATION;  Surgeon: Harden Jerona GAILS, MD;  Location: [facility] OR;  Service: Orthopedics;  Laterality: Right;   APPLICATION OF WOUND VAC Right 06/13/2022   Procedure: APPLICATION OF WOUND VAC;  Surgeon: Harden Jerona GAILS, MD;  Location: [facility] OR;  Service: Orthopedics;  Laterality: Right;   APPLICATION OF WOUND VAC Left 11/14/2022   Procedure: APPLICATION OF WOUND VAC;  Surgeon: Harden Jerona GAILS, MD;  Location: [facility] OR;  Service: Orthopedics;  Laterality: Left;   AV FISTULA PLACEMENT Left 04/02/2022   Procedure: LEFT ARM ARTERIOVENOUS (AV) FISTULA CREATION;  Surgeon: Serene Gaile ORN, MD;  Location: [facility] OR;  Service: Vascular;  Laterality: Left;  PERIPHERAL NERVE BLOCK   AV FISTULA PLACEMENT Right 04/27/2023   Procedure: RIGHT ARM BRACHIOBASILIC ARTERIOVENOUS (AV) FISTULA CREATION;  Surgeon: Magda Debby SAILOR, MD;  Location: [facility] OR;  Service: Vascular;  Laterality: Right;   BASCILIC VEIN TRANSPOSITION Left 07/31/2022   Procedure: LEFT ARM SECOND STAGE BASILIC VEIN TRANSPOSITION;  Surgeon: Serene Gaile ORN, MD;  Location: [facility] OR;  Service: Vascular;  Laterality: Left;   BIOPSY  05/27/2020   Procedure: BIOPSY;  Surgeon: Cindie Carlin POUR, DO;  Location: [facility];  Service: Endoscopy;;   CATARACT  EXTRACTION, BILATERAL Bilateral ?2013   COLONOSCOPY W/ POLYPECTOMY     COLONOSCOPY WITH PROPOFOL  N/A 03/13/2019   Procedure: COLONOSCOPY WITH PROPOFOL ;  Surgeon: Albertus Gordy HERO, MD;  Location: [facility] ENDOSCOPY;  Service: Gastroenterology;  Laterality: N/A;   CORONARY ANGIOPLASTY WITH STENT PLACEMENT  1999; 2007   1 + 1   ERCP N/A 02/03/2022   Procedure: ENDOSCOPIC RETROGRADE CHOLANGIOPANCREATOGRAPHY (ERCP);  Surgeon: Rollin Dover, MD;  Location: [facility] ENDOSCOPY;  Service: Gastroenterology;  Laterality: N/A;   ESOPHAGOGASTRODUODENOSCOPY N/A 02/12/2023   Procedure: ESOPHAGOGASTRODUODENOSCOPY (EGD);  Surgeon: Rollin Dover, MD;  Location: [facility] ENDOSCOPY;  Service: Gastroenterology;  Laterality: N/A;   ESOPHAGOGASTRODUODENOSCOPY (EGD) WITH PROPOFOL  N/A 03/13/2019   Procedure: ESOPHAGOGASTRODUODENOSCOPY (EGD) WITH PROPOFOL ;  Surgeon: Albertus Gordy HERO, MD;  Location: [facility] ENDOSCOPY;  Service: Gastroenterology;  Laterality: N/A;   ESOPHAGOGASTRODUODENOSCOPY (EGD) WITH PROPOFOL  N/A 05/27/2020   Procedure: ESOPHAGOGASTRODUODENOSCOPY (EGD) WITH PROPOFOL ;  Surgeon: Cindie Carlin POUR, DO;  [facility];  Service: Endoscopy;  Laterality: N/A;   ESOPHAGOGASTRODUODENOSCOPY (EGD) WITH PROPOFOL  N/A 09/03/2022   Procedure: ESOPHAGOGASTRODUODENOSCOPY (EGD) WITH PROPOFOL ;  Surgeon: Rollin Dover, MD;  Location: [facility] ENDOSCOPY;  Service: Gastroenterology;  Laterality: N/A;   EYE SURGERY Bilateral    lazer   FLEXIBLE SIGMOIDOSCOPY N/A 05/23/2022   Procedure: FLEXIBLE SIGMOIDOSCOPY;  Surgeon: Rollin Dover, MD;  Location: [facility] ENDOSCOPY;  Service: Gastroenterology;  Laterality: N/A;   FLEXIBLE SIGMOIDOSCOPY N/A 05/24/2022   Procedure: FLEXIBLE SIGMOIDOSCOPY;  Surgeon: Federico Rosario BROCKS, MD;  Location: [facility] ENDOSCOPY;  Service: Gastroenterology;  Laterality: N/A;   FLEXIBLE SIGMOIDOSCOPY N/A 09/03/2022   Procedure: FLEXIBLE SIGMOIDOSCOPY;  Surgeon: Rollin Dover, MD;  Location: [facility] ENDOSCOPY;  Service: Gastroenterology;  Laterality:  N/A;   HEMOSTASIS CLIP PLACEMENT  03/13/2019   Procedure: HEMOSTASIS CLIP PLACEMENT;  Surgeon: Albertus Gordy HERO, MD;  Location: [facility] ENDOSCOPY;  Service: Gastroenterology;;   HEMOSTASIS CLIP PLACEMENT  05/23/2022   Procedure: HEMOSTASIS CLIP PLACEMENT;  Surgeon: Rollin Dover, MD;  Location: [facility] ENDOSCOPY;  Service: Gastroenterology;;   HEMOSTASIS CONTROL  05/24/2022   Procedure: HEMOSTASIS CONTROL;  Surgeon: Federico Rosario BROCKS, MD;  Location: [facility] ENDOSCOPY;  Service: Gastroenterology;;   HOT HEMOSTASIS N/A 05/23/2022   Procedure: HOT HEMOSTASIS (ARGON PLASMA COAGULATION/BICAP);  Surgeon: Rollin Dover, MD;  Location: [facility] ENDOSCOPY;  Service: Gastroenterology;  Laterality: N/A;   I & D EXTREMITY Left 05/05/2022   Procedure: IRRIGATION AND DEBRIDEMENT LEFT ARM AV FISTULA;  Surgeon: Gretta Lonni PARAS, MD;  Location: [facility] OR;  Service: Vascular;  Laterality: Left;   I & D EXTREMITY N/A 11/14/2022   Procedure: IRRIGATION AND DEBRIDEMENT OF LOWER EXTREMITY WOUND;  Surgeon: Harden Jerona GAILS, MD;  Location: [facility] OR;  Service: Orthopedics;  Laterality: N/A;   INSERTION OF DIALYSIS CATHETER Right 04/02/2022   Procedure: INSERTION OF TUNNELED DIALYSIS CATHETER;  Surgeon: Serene Gaile ORN, MD;  Location: [facility] OR;  Service: Vascular;  Laterality: Right;   KNEE ARTHROSCOPY Left 10/25/2006   POLYPECTOMY  03/13/2019   Procedure: POLYPECTOMY;  Surgeon: Albertus Gordy HERO, MD;  Location: [facility] ENDOS[facility];  Service: Gastroenterology;;   REMOVAL OF STONES  02/03/2022   Procedure: REMOVAL OF STONES;  Surgeon: Rollin Dover, MD;  Location: [facility] ENDOSCOPY;  Service: Gastroenterology;;   REVISON OF ARTERIOVENOUS FISTULA Left 08/20/2022   Procedure: REVISON OF LEFT ARM ARTERIOVENOUS FISTULA;  Surgeon: Serene Gaile ORN, MD;  Location: [facility] OR;  Service: Vascular;  Laterality: Left;   RIGHT HEART CATH N/A 07/24/2017   Procedure: RIGHT HEART CATH;  Surgeon: Cherrie Toribio SAUNDERS, MD;  Location: [facility];  Service: Cardiovascular;  Laterality: N/A;    RIGHT HEART CATHETERIZATION N/A 09/22/2013   Procedure: RIGHT HEART CATH;  Surgeon: Toribio SAUNDERS Cherrie, MD;  Location: [facility] CATH LAB;  Service: Cardiovascular;  Laterality: N/A;   SHOULDER ARTHROSCOPY WITH OPEN ROTATOR CUFF REPAIR Right 03/14/2014   Procedure: RIGHT SHOULDER ARTHROSCOPY WITH BICEPS RELEASE, OPEN SUBSCAPULA REPAIR, OPEN SUPRASPINATUS REPAIR.;  Surgeon: Cordella Glendia Hutchinson, MD;  Location: [facility] OR;  Service: Orthopedics;  Laterality: Right;   SPHINCTEROTOMY  02/03/2022   Procedure: SPHINCTEROTOMY;  Surgeon: Rollin Dover, MD;  Location: [facility] ENDOSCOPY;  Service: Gastroenterology;;   STUMP REVISION Right 06/13/2022   Procedure: REVISION RIGHT BELOW KNEE AMPUTATION;  Surgeon: Harden Jerona GAILS, MD;  Location: [facility] OR;  Service: Orthopedics;  Laterality: Right;   TEE WITHOUT CARDIOVERSION N/A 02/04/2022   Procedure: TRANSESOPHAGEAL ECHOCARDIOGRAM (TEE);  Surgeon: Cherrie Toribio SAUNDERS, MD;  Location: [facility] ENDOSCOPY;  Service: Cardiovascular;  Laterality: N/A;   THROMBECTOMY W/ EMBOLECTOMY Left 08/20/2022   Procedure: THROMBECTOMY OF LEFT ARM ARTERIOVENOUS FISTULA;  Surgeon: Serene Gaile ORN, MD;  Location: [facility] OR;  Service: Vascular;  Laterality: Left;   TOE AMPUTATION Right 02/24/2018   GREAT TOE AND 2ND TOE AMPUTATION   TUBAL LIGATION  1970's   Patient Active Problem List   Diagnosis Date Noted   Atrial fibrillation (HCC) 06/17/2023   Atopic dermatitis 06/17/2023   Hyperosmolar hyperglycemic state (HHS) (HCC) 02/05/2023   Gastroparesis 02/05/2023   Depression 01/29/2023   Intractable nausea and vomiting 01/20/2023   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) 04/07/2022   Allergy, unspecified,  initial encounter 04/07/2022   Dependence on renal dialysis (HCC) 04/07/2022   Gout due to renal impairment, unspecified site 04/07/2022   Hypertensive heart and chronic kidney disease with heart failure and with stage 5 chronic kidney disease, or end stage renal disease (HCC) 04/07/2022   Personal history of transient ischemic attack (TIA), and cerebral infarction without residual deficits 04/07/2022   Renal osteodystrophy 04/07/2022   Venous stasis ulcer of right calf (HCC) 03/31/2022   Fistula, colovaginal 03/26/2022   Diarrhea 03/26/2022   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   Sepsis without acute organ dysfunction (HCC)    Bacteremia    Acute pancreatitis 02/01/2022   Abdominal pain 02/01/2022   SIRS (systemic inflammatory response syndrome) (HCC) 02/01/2022   Transaminitis 02/01/2022   History of anemia due to chronic kidney disease 02/01/2022   Paroxysmal atrial fibrillation (HCC) 02/01/2022   Uncontrolled type 2 diabetes mellitus with hyperglycemia, with long-term current use of insulin  (HCC) 01/14/2022   NSTEMI (non-ST  elevated myocardial infarction) (HCC) 03/05/2021   Acute renal failure superimposed on stage 4 chronic kidney disease (HCC) 08/22/2020   Hypoalbuminemia 05/25/2020   GERD (gastroesophageal reflux disease) 05/25/2020   Pressure injury of skin 05/17/2020   Acute on chronic combined systolic and diastolic congestive heart failure (HCC) 03/07/2020   Type 2 diabetes mellitus with diabetic polyneuropathy, with long-term current use of insulin  (HCC) 03/07/2020   Obesity, Class III, BMI 40-49.9 (morbid obesity) (HCC) 03/07/2020   Common bile duct (CBD) obstruction 05/28/2019   Benign neoplasm of ascending colon    Benign neoplasm of transverse colon    Benign neoplasm of descending colon    Benign neoplasm of sigmoid colon    Gastric polyps    Hyperkalemia 03/11/2019   Prolonged QT interval 03/11/2019   Acute blood loss anemia 03/11/2019   Onychomycosis 06/21/2018   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)    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 (HCC)    PVD (peripheral vascular disease) (HCC) 10/26/2017   E-coli UTI 07/27/2017   Hypothyroidism 07/27/2017   AKI (acute kidney injury) (HCC)    PAH (pulmonary artery hypertension) (HCC)    Impaired ambulation 07/19/2017   Leg cramps 02/27/2017   Peripheral edema 01/12/2017   Diabetic neuropathy (HCC) 11/12/2016   CKD (chronic kidney disease), stage IV (HCC) 10/24/2015   Anemia of chronic disease 10/03/2015   Historical diagnosis of generalized anxiety disorder 10/03/2015   Secondary insomnia 10/03/2015   Acute bronchitis 09/05/2015   Hyperglycemia due to diabetes mellitus (HCC) 06/07/2015   Non compliance with medical treatment 04/17/2014   Rotator cuff tear 03/14/2014   Obesity 09/23/2013   Chronic HFrEF (heart failure with reduced ejection fraction) (HCC) 06/03/2013   Hypotension 12/25/2012   Hyponatremia 12/25/2012   Urinary incontinence    MDD (major depressive disorder), recurrent episode, moderate  (HCC) 11/12/2010   RBBB (right bundle branch block)    Wide-complex tachycardia    Coronary artery disease    Hyperlipemia 01/22/2009   Chronic hypotension 01/22/2009    ONSET DATE: 03/09/2023 MD referral to PT  REFERRING DIAG: S88.111D (ICD-10-CM) - Below-knee amputation of right lower extremity, subsequent encounter   THERAPY DIAG:  Unsteadiness on feet  Muscle weakness (generalized)  Impaired functional mobility, balance, gait, and endurance  Phantom limb syndrome with pain (HCC)  Other abnormalities of gait and mobility  Right below-knee amputee (HCC)  Rationale for Evaluation and Treatment: Rehabilitation  SUBJECTIVE:  SUBJECTIVE STATEMENT: Feels a little better but still has  cough. Still having a lot of sweat on the residual limb. Conversation about using antiperspirant instead of deodorant to reduce sweat had with patient and husband with both verbalizing understanding.    Husband, Joe present at session.   PERTINENT HISTORY: Right TTA 05/02/22, Lymphedema, idiopathic chronic venous HTN with ulcer, depression, colitis, diverticulitis, ESRD, gout, NSTEMI, DM2, polyneuropathy, PVD, CAD, right bundle branch block, mini strokes  PAIN:  Are you having pain? No pain today.   PRECAUTIONS: Fall and Other: No BP LUE  WEIGHT BEARING RESTRICTIONS: No  FALLS: Has patient fallen in last 6 months? No  LIVING ENVIRONMENT: Lives with: lives with their spouse and 2 small dogs Lives in: mobile home Home Access: Ramped entrance Home layout: One level Stairs: Yes: External: 6-7 steps; can reach both Has following equipment at home: Single point cane, Environmental Consultant - 2 wheeled, Environmental Consultant - 4 wheeled, Wheelchair (manual), Graybar electric, Grab bars, and Ramped entry  OCCUPATION:  retired  PLOF: prior to amputation for ~year used RW in home & community  PATIENT GOALS:  to use prosthesis to walk, get out w/c in & out of car, get to bathroom.   OBJECTIVE:  COGNITION: Overall cognitive  status:  Eval on 03/23/2023:   Within functional limits for tasks assessed  POSTURE:  Eval on 03/23/2023:  rounded shoulders, forward head, increased thoracic kyphosis, flexed trunk , and weight shift left  LOWER EXTREMITY ROM: ROM Right eval Left eval  Hip flexion    Hip extension    Hip abduction    Hip adduction    Hip internal rotation    Hip external rotation    Knee flexion    Knee extension    Ankle dorsiflexion    Ankle plantarflexion    Ankle inversion    Ankle eversion     (Blank rows = not tested)  LOWER EXTREMITY MMT:  MMT Right eval Left eval  Hip flexion 3-/5 3-/5  Hip extension 2/5 2/5  Hip abduction 2+/5 2+/5  Hip adduction    Hip internal rotation    Hip external rotation    Knee flexion 3-/5 3-/5  Knee extension 3-/5 3-/5  Ankle dorsiflexion    Ankle plantarflexion    Ankle inversion    Ankle eversion    At Evaluation all strength testing is grossly seated and functionally standing / gait. (Blank rows = not tested)  TRANSFERS: 06/15/2023: -Sit to stand transfers with RW wheelchair<>mat table at varied height: 19in with maxA to rise to stand; 22in ModA rise to stand, 24in min-modA rise to stand. -stand pivot transfer with RW w/c to mat table with modA once standing.  -Lateral scooting wc<>table at level height SBA; vc for LE positioning for optimal use of LE during transfer -lateral scoot R: modA due to fatigue and uneven surface transfer from low surface to higher surface   05/04/2023: Squat pivot transfers with modA.  04/21/2023: Pt sit to stand w/c to //bar with one hand pushing on w/c and other hand on //bar with modA first time & MinA 2nd/ 3rd time.  Best is RUE on //bar & LUE on w/c.  Stand to sit reaching LUE to w/c with minA to control descent.   PT demo & verbal cues on sit to/from stand w/c to RW.  1st time modA 2 person. 2nd time maxA one person.  Stand to sit with minA and constant cueing. Scooting transfers with minA with w/c & mat  direct contact & same ht.   Eval on  03/23/2023:  Sit to stand: Max A (two-person assist) from 22 w/c to rolling walker Stand to sit: Max A (two-person assist) rolling walker to wheelchair  FUNCTIONAL TESTs:   06/15/2023: -Static stance for standing with RW; requiring Mod-MaxA for rise to stand d/t patient unsteady on feet posterior lean without proper righting strategy, SBA for stand   04/21/2023: Pt able to stand 60 sec with BUE on //bars with supervision.  Pt able to stand for 1 min with RW support with minA.  Eval on 03/23/2023: Patient maintained upright holding rolling walker with mod assist 2 people for safety for 30 seconds.  GAIT: 06/17/2023: Patient ambulated 33ft (maximal tolerated distance) with RW ModA (2 person for safety). Required VC for LE sequencing and minA to steer walker and prevent tip over.   04/21/2023: Pt amb 5' in //bars with modA (2 person for safety) with PT verbal & tactile cues on sequence and weight shift.  Pt amb 10' with RW with maxA (2 person for safety) with PT verbal & tactile cues on sequence and weight shift.  Eval on 03/23/2023:  Patient took 2 steps (one per LE) with +2 max assist with rolling walker and TTA prosthesis.  Patient adducting prosthesis stepping on left foot with no awareness.  CURRENT PROSTHETIC WEAR ASSESSMENT: 06/17/2023:  Independence with prosthetic care:  Patient and husband are independent with: skin check, prosthetic cleaning, ply sock cleaning, proper wear schedule/adjustment Patient is dependent with: residual limb care, correct ply sock adjustment, and proper weight-bearing schedule/adjustment Donning prosthesis: husband is independent with proper donning. Patient requires MaxA. Patient can verbally direct someone how to properly assist. Doffing prosthesis: Husband is independent with proper donning. Patient requires modA. Patient can verbally direct someone how to properly assist. Prosthetic wear tolerance:  majority of awake hours including dialysis hours, 1x/day, 3-4 days/week Prosthetic weight bearing tolerance: able to tolerate of standing without complains of prosthetic limb pain. Time not limited by limb discomfort but limited by muscle fatigue.   Eval on 03/23/2023:   Patient is dependent with: skin check, residual limb care, prosthetic cleaning, ply sock cleaning, correct ply sock adjustment, proper wear schedule/adjustment, and proper weight-bearing schedule/adjustment Donning prosthesis: Max A Doffing prosthesis: Min A Prosthetic wear tolerance: 2-6 hours, 1x/day, 3-4 days/week Prosthetic weight bearing tolerance: 3 minutes with limb pain Edema: pitting edema Residual limb condition: No open areas, dry skin, normal color and temperature, cylindrical shape Prosthetic description: Silicone liner with pin lock suspension, total contact socket with flexible inner socket, sach foot    TODAY'S  TREATMENT:                                                                                                                             DATE: 06/29/2023 Therapeutic Activities: Seated anterior posterior leans in wc x5 Seated rotations x5ea R/L vc for increasing rotation by asking pt to locate objects over shoulder Seated reach side bend to floor with UE hover  for safety x5ea; vc for increased reach to floor and weight shift Sit to stand with LE feet walk in from 18in chair with armrest to stand upright with RW x6; required ModA progressing to MinA for last 2 attempts. VC for weight shift to unload limb before attempt to step LE in. Also required increased vc for sequencing of UE from armrest to RW at times requiring modA to prevent RW tipover Ambulated with RW CGA 11ft with wc follow; visual cues present on RW  Transferred stand pivot from wc to 18in chair min-modA for rise to stand, vc for sequencing required   TREATMENT:                                                                                                                              DATE: 06/29/2023 Therapeutic Activities: -STS 22 w/c to RW with modA with verbal cues.  From 18 chair with armrest to RW with modA. -stand pivot transfer with RW with minA once upright.  Static stance 1 min with RW support turning head right/left and up/down 3 reps each. -PT used internet to discuss transport w/c which could be used as 2nd chair in home that has armrests and is lighter for husband's protection. The negative is typically armrests are not removable so will not be able to do scooting transfer.  Pt & husband verbalize understanding.   Prosthetic Training with TTA Prosthesis: -pt amb 10' X 2 with 90* to position to sit with RW with modA 1 person (2nd person close for safety) without w/c following. PT cues on postioning RW & turning to position. Visual cues on RW with verbal cues for step width.   -pt amb 5' X 2 with RW with 90* turn to right to position to sit with her husband providing primary assist and PT CGA for safety.  PT provided ~2/3 of cues needed.     TREATMENT:                                                                                                                             DATE: 06/24/2023 Theract: -STS from wc with LE walk back requiring Max-ModA for patient to rise to stand; vc for sequencing; modA to prevent walker tipover -Dynamic sitting balance with 6 blazepods at varied distances; 5 rounds of 12 taps, rest between bouts; vc for proper weight shift and counterbalance along with proper UE  usage to tap blazepod -Raised table 24<>wc: use of RW to take steps to surfaces then 90 and 180deg turns to position self in front of chair. Required increased VC while turning for sequencing. Had 2 instances of LOB requiring therapist intervention (modA) to remain upright. When turning to sit, patient required increased VC to unload prosthesis before sitting down.     TREATMENT:                                                                                                                              DATE: 06/22/2023 Therex: Nustep level 5 with BLE & BUEs use then BLEs only level 4  Theract: -Sit to stand transfers with RW wc<>nu step with modA for safety and heavy vc for planning and sequencing. -STS at 24in with RW; vc for proper rise to stand and glute squeeze. Mod-minA at [facility] to prevent tip over as well as VC for pt to push onto RW not pull backwards. -Sit to stand with RW with cone tap x4ea UE to varied cones set up on table at height of walker. VC for improved weight shift with UE on walker for additional support -sitting dynamic balance 5 cone pick up at varied distances 1 round each UE; vc for proper weight shift and counterbalance  Prosthetic training: -Ambulated 27ftt +62ft with modA (x2 for pt safety) with visual cues on RW for proper LE placement; chair follow for second bout; vc and steering/positioning of walker throughout and prevent tip over.  TREATMENT:                                                                                                                             DATE: 06/17/2023 Therex: Nustep level 5 with BLE & BUEs use then BLEs only level 4  Theract: Stand pivot R<>L with RW ModA, VC for sequencing of LE w/ walker adjustments. STS with RW at 24in, 22in, 20in x5ea height. 24in:Mod-MinA , 22in: ModA, Mod-MaxA with fatigue however controlled descent throughout. Requires min-modA to shift weight over feet for stable position and to stabilize walker to prevent tip over while patient establishes balance in standing. All trials with increased vc for leaning anteriorly before attempt to rise to stand and sequencing of UE surface<>RW.  Patient required increased rest between bouts.   Prosthetic training: -Amb 55ft (max distance tolerated) with modA (x2 for pt safety) with visual  cues on RW for proper LE placement; chair follow; vc and steering/positioning of walker  throughout and prevent tip over.    PATIENT EDUCATION: PATIENT EDUCATED ON FOLLOWING PROSTHETIC CARE: Education details: Use of stockinette under proximal liner to decrease itching sensation, no wounds presents to PT recommended not using Vive wear under liner Skin check, Prosthetic cleaning, Propper donning, and Proper wear schedule/adjustment Prosthetic wear tolerance: 3 hours 2x/day, 4 days/week Person educated: Patient and Spouse Education method: Explanation, Tactile cues, and Verbal cues Education comprehension: verbalized understanding, verbal cues required, tactile cues required, and needs further education  HOME EXERCISE PROGRAM: Access Code: [email or web address] URL: [email or web address] Date: 04/15/2023 Prepared by: Grayce Spatz  Exercises - Supine Bridge  - 1 x daily - 4 x weekly - 2 sets - 5 reps - 2 seconds hold - Supine Lower Trunk Rotation  - 1 x daily - 4 x weekly - 2 sets - 5 reps - 5 seconds hold - Supine Heel Slide with Strap  - 1 x daily - 4 x weekly - 2 sets - 5 reps - 2-3 seconds hold - Supine Hip Abduction  - 1 x daily - 4 x weekly - 2 sets - 5 reps - 2-3 seconds hold - Seated Hip Flexion Toward Target  - 1 x daily - 4 x weekly - 2 sets - 5 reps - 5 seconds hold - Seated Eccentric Abdominal Lean Back  - 1 x daily - 4 x weekly - 2 sets - 5 reps - 5 seconds hold - Seated Sidebending  - 1 x daily - 4 x weekly - 2 sets - 5 reps - 5 seconds hold - Seated Trunk Rotation with Crossed Arms  - 1 x daily - 4 x weekly - 2 sets - 5 reps - 5 seconds hold   ASSESSMENT:  CLINICAL IMPRESSION:  Patient continues to make progress demonstrating reduced assist during ambulation however rising to stand continues to require increased assist with few instances of reduced assist sporadically. Patient will benefit from continued skilled physical therapy to address deficits in strength, endurance, and coordination.    OBJECTIVE IMPAIRMENTS: Abnormal gait, decreased activity  tolerance, decreased balance, decreased endurance, decreased knowledge of condition, decreased knowledge of use of DME, decreased mobility, difficulty walking, decreased ROM, decreased strength, decreased safety awareness, postural dysfunction, prosthetic dependency , and pain.   ACTIVITY LIMITATIONS: standing, transfers, and locomotion level  PARTICIPATION LIMITATIONS: community activity, household mobility and dependency / burden of care on family  PERSONAL FACTORS: Age, Fitness, Past/current experiences, Time since onset of injury/illness/exacerbation, and 3+ comorbidities: see PMH  are also affecting patient's functional outcome.   REHAB POTENTIAL: Good  CLINICAL DECISION MAKING: Evolving/moderate complexity  EVALUATION COMPLEXITY: Moderate   GOALS: Goals reviewed with patient? Yes  SHORT TERM GOALS: Target date: 07/15/2023:  Patient & husband verbalize proper residual limb care and understanding for adjusting ply socks.  Baseline: SEE OBJECTIVE DATA Goal status:   set 06/17/2023 2.  Patient able to sit to stand w/c to rolling walker with 1 person modA and maintain upright with RW for 4 min with SBA to assist with toileting needs/hygienic cleaning.  Baseline: SEE OBJECTIVE DATA Goal status: set 06/17/2023  3. Patient ambulates 59ft including turning 90* to position to sit with RW & prosthesis with modA to navigate reaching bathroom in home.  Baseline: SEE OBJECTIVE DATA Goal status: set 06/17/2023   LONG TERM GOALS: Target date: 09/10/2023  Patient & husband demonstrates & verbalizes understanding of prosthetic care to enable  safe utilization of prosthesis. Baseline: SEE OBJECTIVE DATA Goal status: On going/Partially met 06/15/2023  Patient tolerates prosthesis wear >80% of awake hours on non-dialysis days and >50% on dialysis days without skin issues and limb pain </= 4/10. Baseline: SEE OBJECTIVE DATA Goal status: MET 06/15/2023  Scooting transfers with minA and sit to/from  stand transfers with modA.  Baseline: SEE OBJECTIVE DATA - scooting met but sit to stand MaxA Goal status: Partially MET 06/15/2023 / ongoing for sit to stand  Patient ambulates >41ft including turning 90* to position to sit with prosthesis & RW with modA for household navigation.  Baseline: SEE OBJECTIVE DATA Goal status: updated  06/17/2023  Patient able to maintain static stance with RW support with SBA for 5 min to assist with hygienic cleaning.  Baseline: SEE OBJECTIVE DATA Goal status:   updated 06/17/2023   PLAN:  PT FREQUENCY: 2x/week  PT DURATION: 12 weeks  PLANNED INTERVENTIONS: 97164- PT Re-evaluation, 97110-Therapeutic exercises, 97530- Therapeutic activity, 97112- Neuromuscular re-education, [phone]- Self Care, [id number]- Gait training, [phone]- Prosthetic training, Patient/Family education, Balance training, DME instructions, Therapeutic exercises, Therapeutic activity, Neuromuscular re-education, Gait training, and Self Care  PLAN FOR NEXT SESSION:  continue STS with feet walk in and ambulation. See about car transfers.     Jdyn Parkerson, Amogh Komatsu, Student-PT 07/01/2023, 3:26 PM  This entire session of physical therapy was performed under the direct supervision of PT signing evaluation /treatment. PT reviewed note and agrees.   Robin Waldron, PT, DPT 07/01/2023, 3:50 PM

## 2023-07-02 ENCOUNTER — Other Ambulatory Visit: Payer: Self-pay | Admitting: Family Medicine

## 2023-07-02 NOTE — Telephone Encounter (Signed)
 Copied from CRM [id number]. Topic: Clinical - Medication Refill >> Jul 02, 2023  3:53 PM Zebedee SAUNDERS wrote: Most Recent Primary Care Visit:  Provider: ALETHA BENE  Department: [facility]  Visit Type: ACUTE  Date: 06/17/2023  Medication: LORazepam  (ATIVAN ) 0.5 MG tablet  Has the patient contacted their pharmacy? Yes (Agent: If no, request that the patient contact the pharmacy for the refill. If patient does not wish to contact the pharmacy document the reason why and proceed with request.) (Agent: If yes, when and what did the pharmacy advise?)  Is this the correct pharmacy for this prescription? Yes If no, delete pharmacy and type the correct one.  This is the patient's preferred pharmacy:  [id number] [address], [place] - [address] [place] [address] Phone: [phone] Fax: [phone]   Has the prescription been filled recently? Yes  Is the patient out of the medication? Yes  Has the patient been seen for an appointment in the last year OR does the patient have an upcoming appointment? Yes  Can we respond through MyChart? Yes  Agent: Please be advised that Rx refills may take up to 3 business days. We ask that you follow-up with your pharmacy.

## 2023-07-02 NOTE — Telephone Encounter (Signed)
 Last Fill: 06/05/23  Last OV: 06/17/23 Next OV: 07/06/23  Routing to provider for review/authorization.

## 2023-07-04 ENCOUNTER — Other Ambulatory Visit: Payer: Self-pay | Admitting: Family Medicine

## 2023-07-04 ENCOUNTER — Other Ambulatory Visit (HOSPITAL_COMMUNITY): Payer: Self-pay | Admitting: Internal Medicine

## 2023-07-06 ENCOUNTER — Encounter: Payer: Self-pay | Admitting: Family Medicine

## 2023-07-06 ENCOUNTER — Encounter: Payer: Self-pay | Admitting: Physical Therapy

## 2023-07-06 ENCOUNTER — Other Ambulatory Visit: Payer: Self-pay | Admitting: Family Medicine

## 2023-07-06 ENCOUNTER — Ambulatory Visit (INDEPENDENT_AMBULATORY_CARE_PROVIDER_SITE_OTHER): Payer: PPO | Admitting: Family Medicine

## 2023-07-06 ENCOUNTER — Ambulatory Visit
Admission: RE | Admit: 2023-07-06 | Discharge: 2023-07-06 | Disposition: A | Discharge status: Home / Self Care | Payer: PPO | Source: Ambulatory Visit | Attending: Family Medicine | Admitting: Family Medicine

## 2023-07-06 ENCOUNTER — Ambulatory Visit: Payer: PPO | Admitting: Physical Therapy

## 2023-07-06 VITALS — BP 124/68 | HR 67 | Temp 98.1°F

## 2023-07-06 DIAGNOSIS — R2689 Other abnormalities of gait and mobility: Secondary | ICD-10-CM

## 2023-07-06 DIAGNOSIS — R2681 Unsteadiness on feet: Secondary | ICD-10-CM | POA: Diagnosis not present

## 2023-07-06 DIAGNOSIS — J189 Pneumonia, unspecified organism: Secondary | ICD-10-CM

## 2023-07-06 DIAGNOSIS — Z89511 Acquired absence of right leg below knee: Secondary | ICD-10-CM | POA: Diagnosis not present

## 2023-07-06 DIAGNOSIS — M6281 Muscle weakness (generalized): Secondary | ICD-10-CM

## 2023-07-06 DIAGNOSIS — Z7409 Other reduced mobility: Secondary | ICD-10-CM | POA: Diagnosis not present

## 2023-07-06 DIAGNOSIS — G546 Phantom limb syndrome with pain: Secondary | ICD-10-CM | POA: Diagnosis not present

## 2023-07-06 DIAGNOSIS — K219 Gastro-esophageal reflux disease without esophagitis: Secondary | ICD-10-CM

## 2023-07-06 MED ORDER — DOXYCYCLINE HYCLATE 100 MG PO TABS: 100.0000 mg | ORAL_TABLET | Freq: Two times a day (BID) | ORAL | 0 refills | Status: DC

## 2023-07-06 MED ORDER — AMOXICILLIN-POT CLAVULANATE 875-125 MG PO TABS
1.0000 | ORAL_TABLET | Freq: Two times a day (BID) | ORAL | 0 refills | Status: DC
Start: 2023-07-06 — End: 2023-07-29

## 2023-07-06 NOTE — Progress Notes (Signed)
 Subjective:  Patient states she has been coughing for over a month.  Her illness began at the first part of January.  She was seen in our clinic and treated for bronchitis with doxycycline .  She states that the coughing worsened so she went to a local urgent care where they started her on 6 days of amoxicillin .  She denies any sinus pain.  She denies any pain in her ears.  She denies any sore throat.  Her cough is nonproductive.  She denies any pleurisy or hemoptysis or purulent sputum.  However on exam, the patient has prominent right-sided basilar crackles that are asymmetric.  The crackles are not present in the left lower lobe Past Medical History:  Diagnosis Date   Anemia    hx   Anxiety    Arthritis    "generalized" (03/15/2014)   CAD (coronary artery disease)    MI in 2000 - MI  2007 - treated bare metal stent (no nuclear since then as 9/11)   Carotid artery disease (HCC)    Chronic diastolic heart failure (HCC)    a) ECHO (08/2013) EF 55-60% and RV function nl b) RHC (08/2013) RA 4, RV 30/5/7, PA 25/10 (16), PCWP 7, Fick CO/CI 6.3/2.7, PVR 1.5 WU, PA 61 and 66%   Daily headache    "~ every other day; since I fell in June" (03/15/2014)   Depression    Diabetic retinopathy (HCC)    Dyslipidemia    ESRD (end stage renal disease) (HCC)    Dialysis on Tues Thurs Sat   Exertional shortness of breath    GERD (gastroesophageal reflux disease)    History of blood transfusion    History of kidney stones    HTN (hypertension)    Hypothyroidism    Myocardial infarction (HCC)    Obesity    Osteoarthritis    PAF (paroxysmal atrial fibrillation) (HCC)    Peripheral neuropathy    bilateral feet/hands   PONV (postoperative nausea and vomiting)    RBBB (right bundle branch block)    Old   Stroke (HCC)    mini strokes   Type II diabetes mellitus (HCC)    Type II, [name] libre left upper arm. patient has omnipod insulin  pump with Novolin R Insulin    Past Surgical History:  Procedure  Laterality Date   A/V FISTULAGRAM Left 11/07/2022   Procedure: A/V Fistulagram;  Surgeon: Carlene Che, MD;  [facility]: [facility];  Service: Cardiovascular;  Laterality: Left;   ABDOMINAL HYSTERECTOMY  1980's   AMPUTATION Right 02/24/2018   Procedure: RIGHT FOOT GREAT TOE AND 2ND TOE AMPUTATION;  Surgeon: Timothy Ford, MD;  Location: [facility] OR;  Service: Orthopedics;  Laterality: Right;   AMPUTATION Right 04/30/2018   Procedure: RIGHT TRANSMETATARSAL AMPUTATION;  Surgeon: Timothy Ford, MD;  Location: [facility] OR;  Service: Orthopedics;  Laterality: Right;   AMPUTATION Right 05/02/2022   Procedure: RIGHT BELOW KNEE AMPUTATION;  Surgeon: Timothy Ford, MD;  Location: [facility] OR;  Service: Orthopedics;  Laterality: Right;   APPLICATION OF WOUND VAC Right 06/13/2022   Procedure: APPLICATION OF WOUND VAC;  Surgeon: Timothy Ford, MD;  Location: [facility] OR;  Service: Orthopedics;  Laterality: Right;   APPLICATION OF WOUND VAC Left 11/14/2022   Procedure: APPLICATION OF WOUND VAC;  Surgeon: Timothy Ford, MD;  [facility]: [facility];  Service: Orthopedics;  Laterality: Left;   AV FISTULA PLACEMENT Left 04/02/2022   Procedure: LEFT ARM ARTERIOVENOUS (AV) FISTULA CREATION;  Surgeon: Margherita Shell, MD;  Location: [facility] OR;  Service: Vascular;  Laterality: Left;  PERIPHERAL NERVE BLOCK   AV FISTULA PLACEMENT Right 04/27/2023   Procedure: RIGHT ARM BRACHIOBASILIC ARTERIOVENOUS (AV) FISTULA CREATION;  Surgeon: Carlene Che, MD;  Location: [facility];  Service: Vascular;  Laterality: Right;   BASCILIC VEIN TRANSPOSITION Left 07/31/2022   Procedure: LEFT ARM SECOND STAGE BASILIC VEIN TRANSPOSITION;  Surgeon: Margherita Shell, MD;  [facility]: [facility];  Service: Vascular;  Laterality: Left;   BIOPSY  05/27/2020   Procedure: BIOPSY;  Surgeon: Vinetta Greening, DO;  Location: [facility];  Service: Endoscopy;;   CATARACT EXTRACTION, BILATERAL Bilateral ?2013   COLONOSCOPY W/ POLYPECTOMY     COLONOSCOPY WITH PROPOFOL  N/A 03/13/2019    Procedure: COLONOSCOPY WITH PROPOFOL ;  Surgeon: Nannette Babe, MD;  Location: [facility] ENDOSCOPY;  Service: Gastroenterology;  Laterality: N/A;   CORONARY ANGIOPLASTY WITH STENT PLACEMENT  1999; 2007   "1 + 1"   ERCP N/A 02/03/2022   Procedure: ENDOSCOPIC RETROGRADE CHOLANGIOPANCREATOGRAPHY (ERCP);  Surgeon: Alvis Jourdain, MD;  Location: [facility] ENDOSCOPY;  Service: Gastroenterology;  Laterality: N/A;   ESOPHAGOGASTRODUODENOSCOPY N/A 02/12/2023   Procedure: ESOPHAGOGASTRODUODENOSCOPY (EGD);  Surgeon: Alvis Jourdain, MD;  Location: [facility] ENDOSCOPY;  Service: Gastroenterology;  Laterality: N/A;   ESOPHAGOGASTRODUODENOSCOPY (EGD) WITH PROPOFOL  N/A 03/13/2019   Procedure: ESOPHAGOGASTRODUODENOSCOPY (EGD) WITH PROPOFOL ;  Surgeon: Nannette Babe, MD;  Location: [facility] ENDOSCOPY;  Service: Gastroenterology;  Laterality: N/A;   ESOPHAGOGASTRODUODENOSCOPY (EGD) WITH PROPOFOL  N/A 05/27/2020   Procedure: ESOPHAGOGASTRODUODENOSCOPY (EGD) WITH PROPOFOL ;  Surgeon: Vinetta Greening, DO;  [facility];  Service: Endoscopy;  Laterality: N/A;   ESOPHAGOGASTRODUODENOSCOPY (EGD) WITH PROPOFOL  N/A 09/03/2022   Procedure: ESOPHAGOGASTRODUODENOSCOPY (EGD) WITH PROPOFOL ;  Surgeon: Alvis Jourdain, MD;  Location: [facility] ENDOSCOPY;  Service: Gastroenterology;  Laterality: N/A;   EYE SURGERY Bilateral    lazer   FLEXIBLE SIGMOIDOSCOPY N/A 05/23/2022   Procedure: FLEXIBLE SIGMOIDOSCOPY;  Surgeon: Alvis Jourdain, MD;  Location: [facility] ENDOSCOPY;  Service: Gastroenterology;  Laterality: N/A;   FLEXIBLE SIGMOIDOSCOPY N/A 05/24/2022   Procedure: FLEXIBLE SIGMOIDOSCOPY;  Surgeon: Daina Drum, MD;  Location: [facility] ENDOSCOPY;  Service: Gastroenterology;  Laterality: N/A;   FLEXIBLE SIGMOIDOSCOPY N/A 09/03/2022   Procedure: FLEXIBLE SIGMOIDOSCOPY;  Surgeon: Alvis Jourdain, MD;  Location: [facility] ENDOSCOPY;  Service: Gastroenterology;  Laterality: N/A;   HEMOSTASIS CLIP PLACEMENT  03/13/2019   Procedure: HEMOSTASIS CLIP PLACEMENT;  Surgeon: Nannette Babe,  MD;  Location: [facility] ENDOSCOPY;  Service: Gastroenterology;;   HEMOSTASIS CLIP PLACEMENT  05/23/2022   Procedure: HEMOSTASIS CLIP PLACEMENT;  Surgeon: Alvis Jourdain, MD;  Location: [facility] ENDOSCOPY;  Service: Gastroenterology;;   HEMOSTASIS CONTROL  05/24/2022   Procedure: HEMOSTASIS CONTROL;  Surgeon: Daina Drum, MD;  Location: [facility] ENDOSCOPY;  Service: Gastroenterology;;   HOT HEMOSTASIS N/A 05/23/2022   Procedure: HOT HEMOSTASIS (ARGON PLASMA COAGULATION/BICAP);  Surgeon: Alvis Jourdain, MD;  Location: [facility] ENDOSCOPY;  Service: Gastroenterology;  Laterality: N/A;   I & D EXTREMITY Left 05/05/2022   Procedure: IRRIGATION AND DEBRIDEMENT LEFT ARM AV FISTULA;  Surgeon: Young Hensen, MD;  Location: [facility] OR;  Service: Vascular;  Laterality: Left;   I & D EXTREMITY N/A 11/14/2022   Procedure: IRRIGATION AND DEBRIDEMENT OF LOWER EXTREMITY WOUND;  Surgeon: Timothy Ford, MD;  Location: [facility];  Service: Orthopedics;  Laterality: N/A;   INSERTION OF DIALYSIS CATHETER Right 04/02/2022   Procedure: INSERTION OF TUNNELED DIALYSIS CATHETER;  Surgeon: Margherita Shell, MD;  Location: [facility];  Service: Vascular;  Laterality: Right;   KNEE ARTHROSCOPY Left 10/25/2006   POLYPECTOMY  03/13/2019   Procedure: POLYPECTOMY;  Surgeon: Nannette Babe, MD;  Location: [facility] ENDOSCOPY;  Service: Gastroenterology;;   REMOVAL OF STONES  02/03/2022   Procedure: REMOVAL OF STONES;  Surgeon: Alvis Jourdain, MD;  Location: [facility] ENDOSCOPY;  Service: Gastroenterology;;   REVISON OF ARTERIOVENOUS FISTULA Left 08/20/2022   Procedure: REVISON OF LEFT ARM ARTERIOVENOUS FISTULA;  Surgeon: Margherita Shell, MD;  Location[facility];  Service: Vascular;  Laterality: Left;   RIGHT HEART CATH N/A 07/24/2017   Procedure: RIGHT HEART CATH;  Surgeon: Mardell Shade, MD;  [facility]: [facility];  Service: Cardiovascular;  Laterality: N/A;   RIGHT HEART CATHETERIZATION N/A 09/22/2013   Procedure: RIGHT HEART CATH;  Surgeon: Mardell Shade, MD;   Location[facility] CATH LAB;  Service: Cardiovascular;  Laterality: N/A;   SHOULDER ARTHROSCOPY WITH OPEN ROTATOR CUFF REPAIR Right 03/14/2014   Procedure: RIGHT SHOULDER ARTHROSCOPY WITH BICEPS RELEASE, OPEN SUBSCAPULA REPAIR, OPEN SUPRASPINATUS REPAIR.;  Surgeon: Jasmine Mesi, MD;  Location: [facility] OR;  Service: Orthopedics;  Laterality: Right;   SPHINCTEROTOMY  02/03/2022   Procedure: SPHINCTEROTOMY;  Surgeon: Alvis Jourdain, MD;  Location: [facility] ENDOSCOPY;  Service: Gastroenterology;;   STUMP REVISION Right 06/13/2022   Procedure: REVISION RIGHT BELOW KNEE AMPUTATION;  Surgeon: Timothy Ford, MD;  Location: [facility] OR;  Service: Orthopedics;  Laterality: Right;   TEE WITHOUT CARDIOVERSION N/A 02/04/2022   Procedure: TRANSESOPHAGEAL ECHOCARDIOGRAM (TEE);  Surgeon: Mardell Shade, MD;  Location: [facility] ENDOSCOPY;  Service: Cardiovascular;  Laterality: N/A;   THROMBECTOMY W/ EMBOLECTOMY Left 08/20/2022   Procedure: THROMBECTOMY OF LEFT ARM ARTERIOVENOUS FISTULA;  Surgeon: Margherita Shell, MD;  Location: [facility] OR;  Service: Vascular;  Laterality: Left;   TOE AMPUTATION Right 02/24/2018   GREAT TOE AND 2ND TOE AMPUTATION   TUBAL LIGATION  1970's   Current Outpatient Medications on File Prior to Visit  Medication Sig Dispense Refill   acetaminophen  (TYLENOL ) 325 MG tablet Take 2 tablets (650 mg total) by mouth every 6 (six) hours as needed for mild pain (or Fever >/= 101).     albuterol  (VENTOLIN  HFA) 108 (90 Base) MCG/ACT inhaler Inhale 1-2 puffs into the lungs every 6 (six) hours as needed for shortness of breath.     amiodarone  (PACERONE ) 200 MG tablet Take 1 tablet (200 mg total) by mouth daily. 30 tablet 0   apixaban  (ELIQUIS ) 5 MG TABS tablet Take 1 tablet (5 mg total) by mouth 2 (two) times daily. 60 tablet 5   ascorbic acid  (VITAMIN C ) 500 MG tablet Take 1 tablet (500 mg total) by mouth daily. 100 tablet 0   atorvastatin  (LIPITOR ) 80 MG tablet TAKE 1 TABLET BY MOUTH EVERY DAY 90 tablet 0   benzonatate   (TESSALON  PERLES) 100 MG capsule Take 1 capsule (100 mg total) by mouth 3 (three) times daily as needed for cough. 20 capsule 0   carvedilol  (COREG ) 6.25 MG tablet Take 6.25 mg by mouth at bedtime. Take on Mon/Wed/Fri -non Dialysis days     clonazePAM  (KLONOPIN ) 0.5 MG disintegrating tablet Take 1 tablet (0.5 mg total) by mouth 2 (two) times daily. 60 tablet 1   doxycycline  (VIBRA -TABS) 100 MG tablet Take 1 tablet (100 mg total) by mouth 2 (two) times daily. 20 tablet 0   folic acid  (FOLVITE ) 1 MG tablet Take 1 mg by mouth daily.     insulin  aspart (NOVOLOG ) 100 UNIT/ML injection Inject  0-15 Units into the skin 3 (three) times daily with meals. CBG < 70: treat low blood sugar, CBG 70 - 120: 0 units, CBG 121 - 150: 2 units, CBG 151 - 200: 3 units, CBG 201 - 250: 5 units, CBG 251 - 300: 8 units, CBG 301 - 350: 11 units, CBG 351 - 400: 15 units, CBG > 400: call MD 60 mL 5   insulin  glargine (LANTUS ) 100 UNIT/ML Solostar Pen Inject 26 Units into the skin daily. (Patient taking differently: Inject 31 Units into the skin every morning.)     levothyroxine  (SYNTHROID ) 75 MCG tablet Take 1 tablet (75 mcg total) by mouth daily before breakfast. 30 tablet 0   LORazepam  (ATIVAN ) 0.5 MG tablet Take 1 tablet (0.5 mg total) by mouth at bedtime. 30 tablet 2   melatonin 3 MG TABS tablet Take 3 mg by mouth at bedtime.     methocarbamol  (ROBAXIN ) 750 MG tablet Take 1 tablet (750 mg total) by mouth every 6 (six) hours as needed for muscle spasms. 60 tablet 0   Methoxy PEG-Epoetin  Beta (MIRCERA IJ) Mircera     midodrine  (PROAMATINE ) 10 MG tablet Take 1 tablet (10 mg total) by mouth daily. (Patient taking differently: Take 10 mg by mouth Every Tuesday,Thursday,and Saturday with dialysis. Twice daily) 30 tablet 3   nitroGLYCERIN  (NITROSTAT ) 0.4 MG SL tablet Place 1 tablet (0.4 mg total) under the tongue every 5 (five) minutes as needed. 25 tablet 3   oxyCODONE -acetaminophen  (PERCOCET/ROXICET) 5-325 MG tablet Take 1 tablet by  mouth every 6 (six) hours as needed. 20 tablet 0   OXYGEN  Inhale 2 L into the lungs at bedtime.     polyethylene glycol (MIRALAX  / GLYCOLAX ) 17 g packet Take 17 g by mouth daily. (Patient taking differently: Take 17 g by mouth daily as needed for mild constipation or moderate constipation.)     prochlorperazine  (COMPAZINE ) 5 MG tablet Take 1 tablet (5 mg total) by mouth every 8 (eight) hours as needed for nausea or vomiting. 60 tablet 0   triamcinolone  cream (KENALOG ) 0.1 % Apply 1 Application topically 2 (two) times daily. Apply to affected areas BID 45 g 1   venlafaxine  XR (EFFEXOR -XR) 75 MG 24 hr capsule TAKE 1 CAPSULE (75 MG TOTAL) BY MOUTH DAILY WITH BREAKFAST. STOP TRINTELLIX  90 capsule 0   Zinc  Sulfate 220 (50 Zn) MG TABS Take 1 tablet (220 mg total) by mouth daily. 100 tablet 1   No current facility-administered medications on file prior to visit.     Allergies  Allergen Reactions   Cephalexin  Diarrhea and Other (See Comments)   Codeine Nausea And Vomiting and Other (See Comments)   Social History   Socioeconomic History   Marital status: Married    Spouse name: Gaylin Ke   Number of children: 3   Years of education: 12th   Highest education level: Not on file  Occupational History    Employer: UNEMPLOYED  Tobacco Use   Smoking status: Former    Current packs/day: 0.00    Average packs/day: 3.0 packs/day for 32.0 years (96.0 ttl pk-yrs)    Types: Cigarettes    Start date: 10/24/1965    Quit date: 10/24/1997    Years since quitting: 25.7   Smokeless tobacco: Never  Vaping Use   Vaping status: Never Used  Substance and Sexual Activity   Alcohol use: Not Currently   Drug use: No   Sexual activity: Not Currently    Birth control/protection: Surgical    Comment:  Hysterectomy  Other Topics Concern   Not on file  Social History Narrative   Pt lives at home with her spouse.Caffeine Use- 3 sodas daily.    Social Drivers of Corporate investment banker Strain: Low Risk   (08/16/2021)   Overall Financial Resource Strain (CARDIA)    Difficulty of Paying Living Expenses: Not hard at all  Food Insecurity: No Food Insecurity (02/11/2023)   Hunger Vital Sign    Worried About Running Out of Food in the Last Year: Never true    Ran Out of Food in the Last Year: Never true  Transportation Needs: No Transportation Needs (02/11/2023)   PRAPARE - Administrator, Civil Service (Medical): No    Lack of Transportation (Non-Medical): No  Physical Activity: Inactive (08/16/2021)   Exercise Vital Sign    Days of Exercise per Week: 0 days    Minutes of Exercise per Session: 0 min  Stress: No Stress Concern Present (08/16/2021)   [employer] of Occupational Health - Occupational Stress Questionnaire    Feeling of Stress : Not at all  Social Connections: Moderately Isolated (08/16/2021)   Social Connection and Isolation Panel [NHANES]    Frequency of Communication with Friends and Family: More than three times a week    Frequency of Social Gatherings with Friends and Family: More than three times a week    Attends Religious Services: Never    Database administrator or Organizations: No    Attends Banker Meetings: Never    Marital Status: Married  Catering manager Violence: Not At Risk (02/11/2023)   Humiliation, Afraid, Rape, and Kick questionnaire    Fear of Current or Ex-Partner: No    Emotionally Abused: No    Physically Abused: No    Sexually Abused: No     Review of Systems  Gastrointestinal:  Positive for diarrhea.       Objective:   Physical Exam Constitutional:      General: She is not in acute distress.    Appearance: Normal appearance. She is obese. She is not ill-appearing or toxic-appearing.  Cardiovascular:     Rate and Rhythm: Normal rate and regular rhythm.     Heart sounds: Normal heart sounds. No murmur heard.    No friction rub. No gallop.  Pulmonary:     Effort: Pulmonary effort is normal. No respiratory  distress.     Breath sounds: No stridor. Rales present. No wheezing or rhonchi.    Abdominal:     General: Bowel sounds are normal.     Palpations: Abdomen is soft.     Tenderness: There is no abdominal tenderness.  Musculoskeletal:     Left lower leg: No edema.  Skin:    General: Skin is warm.  Neurological:     Mental Status: She is alert.        Assessment & Plan:  Pneumonia of right lower lobe due to infectious organism - Plan: DG Chest 2 View I believe the patient has pneumonia in her right lower lobe.  Situation is complicated by the presence of amiodarone .  Patient has a long QTc interval to begin with.  Therefore I need to avoid fluoroquinolones and Z-Pak.  Start the patient on Augmentin  875 mg daily for 10 days coupled with doxycycline  100 mg p.o. twice daily for 10 days to cover atypicals.  Obtain a chest x-ray to evaluate further.

## 2023-07-06 NOTE — Therapy (Addendum)
 OUTPATIENT PHYSICAL THERAPY PROSTHETIC TREATMENT    Patient Name: Susan [name] MRN: [id number] DOB:1950-11-05, 73 y.o., female Today's Date: 07/06/2023  PCP: Austine Lefort, MD  REFERRING PROVIDER: Timothy Ford, MD   END OF SESSION:  PT End of Session - 07/06/23 1413     Visit Number 25    Number of Visits 44    Date for PT Re-Evaluation 09/10/23    Authorization Type [employer]    Progress Note Due on Visit 30    PT Start Time 1430    PT Stop Time 1510    PT Time Calculation (min) 40 min    Equipment Utilized During Treatment Gait belt    Activity Tolerance Patient tolerated treatment well;Patient limited by fatigue    Behavior During Therapy WFL for tasks assessed/performed             Past Medical History:  Diagnosis Date   Anemia    hx   Anxiety    Arthritis    "generalized" (03/15/2014)   CAD (coronary artery disease)    MI in 2000 - MI  2007 - treated bare metal stent (no nuclear since then as 9/11)   Carotid artery disease (HCC)    Chronic diastolic heart failure (HCC)    a) ECHO (08/2013) EF 55-60% and RV function nl b) RHC (08/2013) RA 4, RV 30/5/7, PA 25/10 (16), PCWP 7, Fick CO/CI 6.3/2.7, PVR 1.5 WU, PA 61 and 66%   Daily headache    "~ every other day; since I fell in June" (03/15/2014)   Depression    Diabetic retinopathy (HCC)    Dyslipidemia    ESRD (end stage renal disease) (HCC)    Dialysis on Tues Thurs Sat   Exertional shortness of breath    GERD (gastroesophageal reflux disease)    History of blood transfusion    History of kidney stones    HTN (hypertension)    Hypothyroidism    Myocardial infarction (HCC)    Obesity    Osteoarthritis    PAF (paroxysmal atrial fibrillation) (HCC)    Peripheral neuropathy    bilateral feet/hands   PONV (postoperative nausea and vomiting)    RBBB (right bundle branch block)    Old   Stroke (HCC)    mini strokes   Type II diabetes mellitus (HCC)    Type II, [name] libre left  upper arm. patient has omnipod insulin  pump with Novolin R Insulin    Past Surgical History:  Procedure Laterality Date   A/V FISTULAGRAM Left 11/07/2022   Procedure: A/V Fistulagram;  Surgeon: Carlene Che, MD;  [facility]: [facility];  Service: Cardiovascular;  Laterality: Left;   ABDOMINAL HYSTERECTOMY  1980's   AMPUTATION Right 02/24/2018   Procedure: RIGHT FOOT GREAT TOE AND 2ND TOE AMPUTATION;  Surgeon: Timothy Ford, MD;  Location: [facility] OR;  Service: Orthopedics;  Laterality: Right;   AMPUTATION Right 04/30/2018   Procedure: RIGHT TRANSMETATARSAL AMPUTATION;  Surgeon: Timothy Ford, MD;  Location[facility] OR;  Service: Orthopedics;  Laterality: Right;   AMPUTATION Right 05/02/2022   Procedure: RIGHT BELOW KNEE AMPUTATION;  Surgeon: Timothy Ford, MD;  Location: [facility] OR;  Service: Orthopedics;  Laterality: Right;   APPLICATION OF WOUND VAC Right 06/13/2022   Procedure: APPLICATION OF WOUND VAC;  Surgeon: Timothy Ford, MD;  Location: [facility] OR;  Service: Orthopedics;  Laterality: Right;   APPLICATION OF WOUND VAC Left 11/14/2022   Procedure: APPLICATION OF WOUND VAC;  Surgeon: Timothy Ford, MD;  Location: [facility] OR;  Service: Orthopedics;  Laterality: Left;   AV FISTULA PLACEMENT Left 04/02/2022   Procedure: LEFT ARM ARTERIOVENOUS (AV) FISTULA CREATION;  Surgeon: Margherita Shell, MD;  Location: [facility] OR;  Service: Vascular;  Laterality: Left;  PERIPHERAL NERVE BLOCK   AV FISTULA PLACEMENT Right 04/27/2023   Procedure: RIGHT ARM BRACHIOBASILIC ARTERIOVENOUS (AV) FISTULA CREATION;  Surgeon: Carlene Che, MD;  Location: [facility] OR;  Service: Vascular;  Laterality: Right;   BASCILIC VEIN TRANSPOSITION Left 07/31/2022   Procedure: LEFT ARM SECOND STAGE BASILIC VEIN TRANSPOSITION;  Surgeon: Margherita Shell, MD;  [facility]: [facility] OR;  Service: Vascular;  Laterality: Left;   BIOPSY  05/27/2020   Procedure: BIOPSY;  Surgeon: Vinetta Greening, DO;  Location: [facility];  Service: Endoscopy;;   CATARACT  EXTRACTION, BILATERAL Bilateral ?2013   COLONOSCOPY W/ POLYPECTOMY     COLONOSCOPY WITH PROPOFOL  N/A 03/13/2019   Procedure: COLONOSCOPY WITH PROPOFOL ;  Surgeon: Nannette Babe, MD;  Location: [facility] ENDOSCOPY;  Service: Gastroenterology;  Laterality: N/A;   CORONARY ANGIOPLASTY WITH STENT PLACEMENT  1999; 2007   "1 + 1"   ERCP N/A 02/03/2022   Procedure: ENDOSCOPIC RETROGRADE CHOLANGIOPANCREATOGRAPHY (ERCP);  Surgeon: Alvis Jourdain, MD;  Location: [facility] ENDOSCOPY;  Service: Gastroenterology;  Laterality: N/A;   ESOPHAGOGASTRODUODENOSCOPY N/A 02/12/2023   Procedure: ESOPHAGOGASTRODUODENOSCOPY (EGD);  Surgeon: Alvis Jourdain, MD;  Location: [facility] ENDOSCOPY;  Service: Gastroenterology;  Laterality: N/A;   ESOPHAGOGASTRODUODENOSCOPY (EGD) WITH PROPOFOL  N/A 03/13/2019   Procedure: ESOPHAGOGASTRODUODENOSCOPY (EGD) WITH PROPOFOL ;  Surgeon: Nannette Babe, MD;  Location: [facility] ENDOSCOPY;  Service: Gastroenterology;  Laterality: N/A;   ESOPHAGOGASTRODUODENOSCOPY (EGD) WITH PROPOFOL  N/A 05/27/2020   Procedure: ESOPHAGOGASTRODUODENOSCOPY (EGD) WITH PROPOFOL ;  Surgeon: Vinetta Greening, DO;  [facility];  Service: Endoscopy;  Laterality: N/A;   ESOPHAGOGASTRODUODENOSCOPY (EGD) WITH PROPOFOL  N/A 09/03/2022   Procedure: ESOPHAGOGASTRODUODENOSCOPY (EGD) WITH PROPOFOL ;  Surgeon: Alvis Jourdain, MD;  Location: [name] ENDOSCOPY;  Service: Gastroenterology;  Laterality: N/A;   EYE SURGERY Bilateral    lazer   FLEXIBLE SIGMOIDOSCOPY N/A 05/23/2022   Procedure: FLEXIBLE SIGMOIDOSCOPY;  Surgeon: Alvis Jourdain, MD;  Location: [facility] ENDOSCOPY;  Service: Gastroenterology;  Laterality: N/A;   FLEXIBLE SIGMOIDOSCOPY N/A 05/24/2022   Procedure: FLEXIBLE SIGMOIDOSCOPY;  Surgeon: Daina Drum, MD;  Location: [facility] ENDOSCOPY;  Service: Gastroenterology;  Laterality: N/A;   FLEXIBLE SIGMOIDOSCOPY N/A 09/03/2022   Procedure: FLEXIBLE SIGMOIDOSCOPY;  Surgeon: Alvis Jourdain, MD;  Location: [facility] ENDOSCOPY;  Service: Gastroenterology;  Laterality:  N/A;   HEMOSTASIS CLIP PLACEMENT  03/13/2019   Procedure: HEMOSTASIS CLIP PLACEMENT;  Surgeon: Nannette Babe, MD;  Location: [facility] ENDOSCOPY;  Service: Gastroenterology;;   HEMOSTASIS CLIP PLACEMENT  05/23/2022   Procedure: HEMOSTASIS CLIP PLACEMENT;  Surgeon: Alvis Jourdain, MD;  Location: [facility] ENDOSCOPY;  Service: Gastroenterology;;   HEMOSTASIS CONTROL  05/24/2022   Procedure: HEMOSTASIS CONTROL;  Surgeon: Daina Drum, MD;  Location: [facility] ENDOSCOPY;  Service: Gastroenterology;;   HOT HEMOSTASIS N/A 05/23/2022   Procedure: HOT HEMOSTASIS (ARGON PLASMA COAGULATION/BICAP);  Surgeon: Alvis Jourdain, MD;  Location: [facility] ENDOSCOPY;  Service: Gastroenterology;  Laterality: N/A;   I & D EXTREMITY Left 05/05/2022   Procedure: IRRIGATION AND DEBRIDEMENT LEFT ARM AV FISTULA;  Surgeon: Young Hensen, MD;  Location: [facility] OR;  Service: Vascular;  Laterality: Left;   I & D EXTREMITY N/A 11/14/2022   Procedure: IRRIGATION AND DEBRIDEMENT OF LOWER EXTREMITY WOUND;  Surgeon: Timothy Ford, MD;  [facility];  Service: Orthopedics;  Laterality: N/A;   INSERTION OF DIALYSIS CATHETER Right 04/02/2022   Procedure: INSERTION OF TUNNELED DIALYSIS CATHETER;  Surgeon: Margherita Shell, MD;  Location: [facility] OR;  Service: Vascular;  Laterality: Right;   KNEE ARTHROSCOPY Left 10/25/2006   POLYPECTOMY  03/13/2019   Procedure: POLYPECTOMY;  Surgeon: Nannette Babe, MD;  Location[facility] ENDOSCOPY;  Service: Gastroenterology;;   REMOVAL OF STONES  02/03/2022   Procedure: REMOVAL OF STONES;  Surgeon: Alvis Jourdain, MD;  Location: [facility] ENDOSCOPY;  Service: Gastroenterology;;   REVISON OF ARTERIOVENOUS FISTULA Left 08/20/2022   Procedure: REVISON OF LEFT ARM ARTERIOVENOUS FISTULA;  Surgeon: Margherita Shell, MD;  Location: [facility] OR;  Service: Vascular;  Laterality: Left;   RIGHT HEART CATH N/A 07/24/2017   Procedure: RIGHT HEART CATH;  Surgeon: Mardell Shade, MD;  [facility];  Service: Cardiovascular;  Laterality: N/A;    RIGHT HEART CATHETERIZATION N/A 09/22/2013   Procedure: RIGHT HEART CATH;  Surgeon: Mardell Shade, MD;  Location: [facility] CATH LAB;  Service: Cardiovascular;  Laterality: N/A;   SHOULDER ARTHROSCOPY WITH OPEN ROTATOR CUFF REPAIR Right 03/14/2014   Procedure: RIGHT SHOULDER ARTHROSCOPY WITH BICEPS RELEASE, OPEN SUBSCAPULA REPAIR, OPEN SUPRASPINATUS REPAIR.;  Surgeon: Jasmine Mesi, MD;  Location: [facility] OR;  Service: Orthopedics;  Laterality: Right;   SPHINCTEROTOMY  02/03/2022   Procedure: SPHINCTEROTOMY;  Surgeon: Alvis Jourdain, MD;  Location: [facility] ENDOSCOPY;  Service: Gastroenterology;;   STUMP REVISION Right 06/13/2022   Procedure: REVISION RIGHT BELOW KNEE AMPUTATION;  Surgeon: Timothy Ford, MD;  Location: [facility] OR;  Service: Orthopedics;  Laterality: Right;   TEE WITHOUT CARDIOVERSION N/A 02/04/2022   Procedure: TRANSESOPHAGEAL ECHOCARDIOGRAM (TEE);  Surgeon: Mardell Shade, MD;  Location: [facility] ENDOSCOPY;  Service: Cardiovascular;  Laterality: N/A;   THROMBECTOMY W/ EMBOLECTOMY Left 08/20/2022   Procedure: THROMBECTOMY OF LEFT ARM ARTERIOVENOUS FISTULA;  Surgeon: Margherita Shell, MD;  Location: [facility] OR;  Service: Vascular;  Laterality: Left;   TOE AMPUTATION Right 02/24/2018   GREAT TOE AND 2ND TOE AMPUTATION   TUBAL LIGATION  1970's   Patient Active Problem List   Diagnosis Date Noted   Atrial fibrillation (HCC) 06/17/2023   Atopic dermatitis 06/17/2023   Hyperosmolar hyperglycemic state (HHS) (HCC) 02/05/2023   Gastroparesis 02/05/2023   Depression 01/29/2023   Intractable nausea and vomiting 01/20/2023   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) 04/07/2022   Allergy, unspecified,  initial encounter 04/07/2022   Dependence on renal dialysis (HCC) 04/07/2022   Gout due to renal impairment, unspecified site 04/07/2022   Hypertensive heart and chronic kidney disease with heart failure and with stage 5 chronic kidney disease, or end stage renal disease (HCC) 04/07/2022   Personal history of transient ischemic attack (TIA), and cerebral infarction without residual deficits 04/07/2022   Renal osteodystrophy 04/07/2022   Venous stasis ulcer of right calf (HCC) 03/31/2022   Fistula, colovaginal 03/26/2022   Diarrhea 03/26/2022   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   Sepsis without acute organ dysfunction (HCC)    Bacteremia    Acute pancreatitis 02/01/2022   Abdominal pain 02/01/2022   SIRS (systemic inflammatory response syndrome) (HCC) 02/01/2022   Transaminitis 02/01/2022   History of anemia due to chronic kidney disease 02/01/2022   Paroxysmal atrial fibrillation (HCC) 02/01/2022   Uncontrolled type 2 diabetes mellitus with hyperglycemia, with long-term current use of insulin  (HCC) 01/14/2022   NSTEMI (non-ST  elevated myocardial infarction) (HCC) 03/05/2021   Acute renal failure superimposed on stage 4 chronic kidney disease (HCC) 08/22/2020   Hypoalbuminemia 05/25/2020   GERD (gastroesophageal reflux disease) 05/25/2020   Pressure injury of skin 05/17/2020   Acute on chronic combined systolic and diastolic congestive heart failure (HCC) 03/07/2020   Type 2 diabetes mellitus with diabetic polyneuropathy, with long-term current use of insulin  (HCC) 03/07/2020   Obesity, Class III, BMI 40-49.9 (morbid obesity) (HCC) 03/07/2020   Common bile duct (CBD) obstruction 05/28/2019   Benign neoplasm of ascending colon    Benign neoplasm of transverse colon    Benign neoplasm of descending colon    Benign neoplasm of sigmoid colon    Gastric polyps    Hyperkalemia 03/11/2019   Prolonged QT interval 03/11/2019   Acute blood loss anemia 03/11/2019   Onychomycosis 06/21/2018   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)    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 (HCC)    PVD (peripheral vascular disease) (HCC) 10/26/2017   E-coli UTI 07/27/2017   Hypothyroidism 07/27/2017   AKI (acute kidney injury) (HCC)    PAH (pulmonary artery hypertension) (HCC)    Impaired ambulation 07/19/2017   Leg cramps 02/27/2017   Peripheral edema 01/12/2017   Diabetic neuropathy (HCC) 11/12/2016   CKD (chronic kidney disease), stage IV (HCC) 10/24/2015   Anemia of chronic disease 10/03/2015   Historical diagnosis of generalized anxiety disorder 10/03/2015   Secondary insomnia 10/03/2015   Acute bronchitis 09/05/2015   Hyperglycemia due to diabetes mellitus (HCC) 06/07/2015   Non compliance with medical treatment 04/17/2014   Rotator cuff tear 03/14/2014   Obesity 09/23/2013   Chronic HFrEF (heart failure with reduced ejection fraction) (HCC) 06/03/2013   Hypotension 12/25/2012   Hyponatremia 12/25/2012   Urinary incontinence    MDD (major depressive disorder), recurrent episode, moderate  (HCC) 11/12/2010   RBBB (right bundle branch block)    Wide-complex tachycardia    Coronary artery disease    Hyperlipemia 01/22/2009   Chronic hypotension 01/22/2009    ONSET DATE: 03/09/2023 MD referral to PT  REFERRING DIAG: S88.111D (ICD-10-CM) - Below-knee amputation of right lower extremity, subsequent encounter   THERAPY DIAG:  Unsteadiness on feet  Muscle weakness (generalized)  Impaired functional mobility, balance, gait, and endurance  Right below-knee amputee (HCC)  Other abnormalities of gait and mobility  Phantom limb syndrome with pain (HCC)  Rationale for Evaluation and Treatment: Rehabilitation  SUBJECTIVE:  SUBJECTIVE STATEMENT: Feels short of breath, will be going  to the doctor for imaging soon to rule in/out pnumonia. Still has some itching and sweating with the residual limb. Verbalizes spraying and wiping down leg at night which has been helping.    Husband, Joe present at session.   PERTINENT HISTORY: Right TTA 05/02/22, Lymphedema, idiopathic chronic venous HTN with ulcer, depression, colitis, diverticulitis, ESRD, gout, NSTEMI, DM2, polyneuropathy, PVD, CAD, right bundle branch block, mini strokes  PAIN:  Are you having pain? No pain today.   PRECAUTIONS: Fall and Other: No BP LUE  WEIGHT BEARING RESTRICTIONS: No  FALLS: Has patient fallen in last 6 months? No  LIVING ENVIRONMENT: Lives with: lives with their spouse and 2 small dogs Lives in: mobile home Home Access: Ramped entrance Home layout: One level Stairs: Yes: External: 6-7 steps; can reach both Has following equipment at home: Single point cane, Environmental consultant - 2 wheeled, Environmental consultant - 4 wheeled, Wheelchair (manual), [employer], Grab bars, and Ramped entry  OCCUPATION:  retired  PLOF: prior to amputation for ~year used RW in home & community  PATIENT GOALS:  to use prosthesis to walk, get out w/c in & out of car, get to bathroom.   OBJECTIVE:  COGNITION: Overall cognitive status:  Eval  on 03/23/2023:   Within functional limits for tasks assessed  POSTURE:  Eval on 03/23/2023:  rounded shoulders, forward head, increased thoracic kyphosis, flexed trunk , and weight shift left  LOWER EXTREMITY ROM: ROM Right eval Left eval  Hip flexion    Hip extension    Hip abduction    Hip adduction    Hip internal rotation    Hip external rotation    Knee flexion    Knee extension    Ankle dorsiflexion    Ankle plantarflexion    Ankle inversion    Ankle eversion     (Blank rows = not tested)  LOWER EXTREMITY MMT:  MMT Right eval Left eval  Hip flexion 3-/5 3-/5  Hip extension 2/5 2/5  Hip abduction 2+/5 2+/5  Hip adduction    Hip internal rotation    Hip external rotation    Knee flexion 3-/5 3-/5  Knee extension 3-/5 3-/5  Ankle dorsiflexion    Ankle plantarflexion    Ankle inversion    Ankle eversion    At Evaluation all strength testing is grossly seated and functionally standing / gait. (Blank rows = not tested)  TRANSFERS: 06/15/2023: -Sit to stand transfers with RW wheelchair<>mat table at varied height: 19in with maxA to rise to stand; 22in ModA rise to stand, 24in min-modA rise to stand. -stand pivot transfer with RW w/c to mat table with modA once standing.  -Lateral scooting wc<>table at level height SBA; vc for LE positioning for optimal use of LE during transfer -lateral scoot R: modA due to fatigue and uneven surface transfer from low surface to higher surface   05/04/2023: Squat pivot transfers with modA.  04/21/2023: Pt sit to stand w/c to //bar with one hand pushing on w/c and other hand on //bar with modA first time & MinA 2nd/ 3rd time.  Best is RUE on //bar & LUE on w/c.  Stand to sit reaching LUE to w/c with minA to control descent.   PT demo & verbal cues on sit to/from stand w/c to RW.  1st time modA 2 person. 2nd time maxA one person.  Stand to sit with minA and constant cueing. Scooting transfers with minA with w/c & mat direct contact  & same ht.  Eval on 03/23/2023:  Sit to stand: Max A (two-person assist) from 22" w/c to rolling walker Stand to sit: Max A (two-person assist) rolling walker to wheelchair  FUNCTIONAL TESTs:   06/15/2023: -Static stance for standing with RW; requiring Mod-MaxA for rise to stand d/t patient unsteady on feet posterior lean without proper righting strategy, SBA for stand   04/21/2023: Pt able to stand 60 sec with BUE on //bars with supervision.  Pt able to stand for 1 min with RW support with minA.  Eval on 03/23/2023: Patient maintained upright holding rolling walker with mod assist 2 people for safety for 30 seconds.  GAIT: 06/17/2023: Patient ambulated 61ft (maximal tolerated distance) with RW ModA (2 person for safety). Required VC for LE sequencing and minA to steer walker and prevent tip over.   04/21/2023: Pt amb 5' in //bars with modA (2 person for safety) with PT verbal & tactile cues on sequence and weight shift.  Pt amb 10' with RW with maxA (2 person for safety) with PT verbal & tactile cues on sequence and weight shift.  Eval on 03/23/2023:  Patient took 2 steps (one per LE) with +2 max assist with rolling walker and TTA prosthesis.  Patient adducting prosthesis stepping on left foot with no awareness.  CURRENT PROSTHETIC WEAR ASSESSMENT: 06/17/2023:  Independence with prosthetic care:  Patient and husband are independent with: skin check, prosthetic cleaning, ply sock cleaning, proper wear schedule/adjustment Patient is dependent with: residual limb care, correct ply sock adjustment, and proper weight-bearing schedule/adjustment Donning prosthesis: husband is independent with proper donning. Patient requires MaxA. Patient can verbally direct someone how to properly assist. Doffing prosthesis: Husband is independent with proper donning. Patient requires modA. Patient can verbally direct someone how to properly assist. Prosthetic wear tolerance: majority of awake  hours including dialysis hours, 1x/day, 3-4 days/week Prosthetic weight bearing tolerance: able to tolerate of standing without complains of prosthetic limb pain. Time not limited by limb discomfort but limited by muscle fatigue.   Eval on 03/23/2023:   Patient is dependent with: skin check, residual limb care, prosthetic cleaning, ply sock cleaning, correct ply sock adjustment, proper wear schedule/adjustment, and proper weight-bearing schedule/adjustment Donning prosthesis: Max A Doffing prosthesis: Min A Prosthetic wear tolerance: 2-6 hours, 1x/day, 3-4 days/week Prosthetic weight bearing tolerance: 3 minutes with limb pain Edema: pitting edema Residual limb condition: No open areas, dry skin, normal color and temperature, cylindrical shape Prosthetic description: Silicone liner with pin lock suspension, total contact socket with flexible inner socket, sach foot    TODAY'S  TREATMENT:                                                                                                                             DATE: 07/06/2023  Vitals: Baseline 97% SpO2, 87 HR SpO2 98% 80 HR after 2 bouts of stand with feet walk in SpO2  100% 78 HR after bouts of standing balance Therapeutic Activities:  Sit to stand w/c to RW with LE walk in x5, ModA to begin progressing to 2 bouts of minA and 1 bout of CGA;  Stand with 1UE tap / reaching forward to therapist hand (shoulder flexion within her arm length)  x4 (2 ea); CGA with RW support.   Neuromuscular Re-education: Standing balance with RW support - looking right/left x5, up/down x5, diagonal x5 ~70min total of standing; modA to MaxA with patient losing balance posteriorly when looking up.  Seated rest then Standing with RW support - scanning up/down x5 with diagonal L looking x5; MinA to CGA as patient progressed. Static stance with RW with eyes closed 10 sec 3 reps with CGA.  Car transfer Attempted however after assessment of patients car, the  seat is too high, even with the car kneeling, for the patient to safely enter car via RW.   TREATMENT:                                                                                                                             DATE: 06/29/2023 Therapeutic Activities: Seated anterior posterior leans in wc x5 Seated rotations x5ea R/L vc for increasing rotation by asking pt to locate objects over shoulder Seated reach side bend to floor with UE hover for safety x5ea; vc for increased reach to floor and weight shift Sit to stand with LE feet walk in from 18in chair with armrest to stand upright with RW x6; required ModA progressing to MinA for last 2 attempts. VC for weight shift to unload limb before attempt to step LE in. Also required increased vc for sequencing of UE from armrest to RW at times requiring modA to prevent RW tipover Ambulated with RW CGA 28ft with wc follow; visual cues present on RW  Transferred stand pivot from wc to 18in chair min-modA for rise to stand, vc for sequencing required   TREATMENT:                                                                                                                             DATE: 06/29/2023 Therapeutic Activities: -STS 22" w/c to RW with modA with verbal cues.  From 18" chair with armrest to RW with modA. -stand pivot transfer with RW with minA once upright.  Static stance 1 min with RW support turning head right/left and up/down 3 reps each. -PT used internet to discuss transport w/c which could  be used as 2nd chair in home that has armrests and is lighter for husband's protection. The negative is typically armrests are not removable so will not be able to do scooting transfer.  Pt & husband verbalize understanding.   Prosthetic Training with TTA Prosthesis: -pt amb 10' X 2 with 90* to position to sit with RW with modA 1 person (2nd person close for safety) without w/c following. PT cues on postioning RW & turning to position. Visual cues  on RW with verbal cues for step width.   -pt amb 5' X 2 with RW with 90* turn to right to position to sit with her husband providing primary assist and PT CGA for safety.  PT provided ~2/3 of cues needed.     TREATMENT:                                                                                                                             DATE: 06/24/2023 Theract: -STS from wc with LE walk back requiring Max-ModA for patient to rise to stand; vc for sequencing; modA to prevent walker tipover -Dynamic sitting balance with 6 blazepods at varied distances; 5 rounds of 12 taps, rest between bouts; vc for proper weight shift and counterbalance along with proper UE usage to tap blazepod -Raised table 24"<>wc: use of RW to take steps to surfaces then 90 and 180deg turns to position self in front of chair. Required increased VC while turning for sequencing. Had 2 instances of LOB requiring therapist intervention (modA) to remain upright. When turning to sit, patient required increased VC to unload prosthesis before sitting down.     TREATMENT:                                                                                                                             DATE: 06/22/2023 Therex: Nustep level 5 with BLE & BUEs use then BLEs only level 4  Theract: -Sit to stand transfers with RW wc<>nu step with modA for safety and heavy vc for planning and sequencing. -STS at 24in with RW; vc for proper rise to stand and glute squeeze. Mod-minA at [facility] to prevent tip over as well as VC for pt to push onto RW not pull backwards. -Sit to stand with RW with cone tap x4ea UE to varied cones set up on table at height of walker. VC for improved weight shift with  UE on walker for additional support -sitting dynamic balance 5 cone pick up at varied distances 1 round each UE; vc for proper weight shift and counterbalance  Prosthetic training: -Ambulated 87ftt +21ft with modA (x2 for pt safety) with  visual cues on RW for proper LE placement; chair follow for second bout; vc and steering/positioning of walker throughout and prevent tip over.  TREATMENT:                                                                                                                             DATE: 06/17/2023 Therex: Nustep level 5 with BLE & BUEs use then BLEs only level 4  Theract: Stand pivot R<>L with RW ModA, VC for sequencing of LE w/ walker adjustments. STS with RW at 24in, 22in, 20in x5ea height. 24in:Mod-MinA , 22in: ModA, Mod-MaxA with fatigue however controlled descent throughout. Requires min-modA to shift weight over feet for stable position and to stabilize walker to prevent tip over while patient establishes balance in standing. All trials with increased vc for leaning anteriorly before attempt to rise to stand and sequencing of UE surface<>RW.  Patient required increased rest between bouts.   Prosthetic training: -Amb 70ft (max distance tolerated) with modA (x2 for pt safety) with visual cues on RW for proper LE placement; chair follow; vc and steering/positioning of walker throughout and prevent tip over.    PATIENT EDUCATION: PATIENT EDUCATED ON FOLLOWING PROSTHETIC CARE: Education details: Use of stockinette under proximal liner to decrease itching sensation, no wounds presents to PT recommended not using Vive wear under liner Skin check, Prosthetic cleaning, Propper donning, and Proper wear schedule/adjustment Prosthetic wear tolerance: 3 hours 2x/day, 4 days/week Person educated: Patient and Spouse Education method: Explanation, Tactile cues, and Verbal cues Education comprehension: verbalized understanding, verbal cues required, tactile cues required, and needs further education  HOME EXERCISE PROGRAM: Access Code: [email or web address] URL: [email or web address] Date: 04/15/2023 Prepared by: Lorie Rook  Exercises - Supine Bridge  - 1 x daily - 4 x weekly - 2 sets - 5  reps - 2 seconds hold - Supine Lower Trunk Rotation  - 1 x daily - 4 x weekly - 2 sets - 5 reps - 5 seconds hold - Supine Heel Slide with Strap  - 1 x daily - 4 x weekly - 2 sets - 5 reps - 2-3 seconds hold - Supine Hip Abduction  - 1 x daily - 4 x weekly - 2 sets - 5 reps - 2-3 seconds hold - Seated Hip Flexion Toward Target  - 1 x daily - 4 x weekly - 2 sets - 5 reps - 5 seconds hold - Seated Eccentric Abdominal Lean Back  - 1 x daily - 4 x weekly - 2 sets - 5 reps - 5 seconds hold - Seated Sidebending  - 1 x daily - 4 x weekly - 2 sets - 5 reps - 5 seconds hold - Seated Trunk Rotation with Crossed  Arms  - 1 x daily - 4 x weekly - 2 sets - 5 reps - 5 seconds hold   ASSESSMENT:  CLINICAL IMPRESSION:  Patient tolerated balance activity well however continues to demonstrate decreased strategy to self correct when losing balance. Patient required maxA to recover from posterior LOB when looking upward however with continued practice was able to progress to CGA. Patient is able to do STS with feet walk in with CGA with close SBA at times however requires multiple attempts at standing to reach lower assist level. Patient will benefit from continued physical therapy to address balance, coordination, and strength deficits.     OBJECTIVE IMPAIRMENTS: Abnormal gait, decreased activity tolerance, decreased balance, decreased endurance, decreased knowledge of condition, decreased knowledge of use of DME, decreased mobility, difficulty walking, decreased ROM, decreased strength, decreased safety awareness, postural dysfunction, prosthetic dependency , and pain.   ACTIVITY LIMITATIONS: standing, transfers, and locomotion level  PARTICIPATION LIMITATIONS: community activity, household mobility and dependency / burden of care on family  PERSONAL FACTORS: Age, Fitness, Past/current experiences, Time since onset of injury/illness/exacerbation, and 3+ comorbidities: see PMH  are also affecting patient's  functional outcome.   REHAB POTENTIAL: Good  CLINICAL DECISION MAKING: Evolving/moderate complexity  EVALUATION COMPLEXITY: Moderate   GOALS: Goals reviewed with patient? Yes  SHORT TERM GOALS: Target date: 07/15/2023:  Patient & husband verbalize proper residual limb care and understanding for adjusting ply socks.  Baseline: SEE OBJECTIVE DATA Goal status:  ongoing 07/06/2023 2.  Patient able to sit to stand w/c to rolling walker with 1 person modA and maintain upright with RW for 4 min with SBA to assist with toileting needs/hygienic cleaning.  Baseline: SEE OBJECTIVE DATA Goal status: ongoing 07/06/2023  3. Patient ambulates 28ft including turning 90* to position to sit with RW & prosthesis with modA to navigate reaching bathroom in home.  Baseline: SEE OBJECTIVE DATA Goal status: ongoing 07/06/2023   LONG TERM GOALS: Target date: 09/10/2023  Patient & husband demonstrates & verbalizes understanding of prosthetic care to enable safe utilization of prosthesis. Baseline: SEE OBJECTIVE DATA Goal status: ongoing 07/06/2023  Patient tolerates prosthesis wear >80% of awake hours on non-dialysis days and >50% on dialysis days without skin issues and limb pain </= 4/10. Baseline: SEE OBJECTIVE DATA Goal status: MET 06/15/2023  Scooting transfers with minA and sit to/from stand transfers with modA.  Baseline: SEE OBJECTIVE DATA - scooting met but sit to stand MaxA Goal status: ongoing 07/06/2023  Patient ambulates >65ft including turning 90* to position to sit with prosthesis & RW with modA for household navigation.  Baseline: SEE OBJECTIVE DATA Goal status: ongoing 07/06/2023  Patient able to maintain static stance with RW support with SBA for 5 min to assist with hygienic cleaning.  Baseline: SEE OBJECTIVE DATA Goal status:  ongoing 07/06/2023   PLAN:  PT FREQUENCY: 2x/week  PT DURATION: 12 weeks  PLANNED INTERVENTIONS: 97164- PT Re-evaluation, 97110-Therapeutic exercises,  97530- Therapeutic activity, 97112- Neuromuscular re-education, [phone]- Self Care, [id number]- Gait training, [phone]- Prosthetic training, Patient/Family education, Balance training, DME instructions, Therapeutic exercises, Therapeutic activity, Neuromuscular re-education, Gait training, and Self Care  PLAN FOR NEXT SESSION:  continue STS including 18" chairs with armrests.  Gait training including attempt ambulation to/from toilet    Amalia Edgecombe, Khia Dieterich, Student-PT 07/06/2023, 4:33 PM  This entire session of physical therapy was performed under the direct supervision of PT signing evaluation /treatment. PT reviewed note and agrees.   Robin Waldron, PT, DPT 07/06/2023, 4:57 PM

## 2023-07-07 DIAGNOSIS — D509 Iron deficiency anemia, unspecified: Secondary | ICD-10-CM | POA: Diagnosis not present

## 2023-07-07 DIAGNOSIS — N2581 Secondary hyperparathyroidism of renal origin: Secondary | ICD-10-CM | POA: Diagnosis not present

## 2023-07-07 DIAGNOSIS — N186 End stage renal disease: Secondary | ICD-10-CM | POA: Diagnosis not present

## 2023-07-07 DIAGNOSIS — Z992 Dependence on renal dialysis: Secondary | ICD-10-CM | POA: Diagnosis not present

## 2023-07-07 DIAGNOSIS — D689 Coagulation defect, unspecified: Secondary | ICD-10-CM | POA: Diagnosis not present

## 2023-07-08 ENCOUNTER — Ambulatory Visit: Payer: PPO | Admitting: Physical Therapy

## 2023-07-08 ENCOUNTER — Encounter: Payer: Self-pay | Admitting: Physical Therapy

## 2023-07-08 DIAGNOSIS — Z89511 Acquired absence of right leg below knee: Secondary | ICD-10-CM

## 2023-07-08 DIAGNOSIS — R2681 Unsteadiness on feet: Secondary | ICD-10-CM | POA: Diagnosis not present

## 2023-07-08 DIAGNOSIS — M6281 Muscle weakness (generalized): Secondary | ICD-10-CM

## 2023-07-08 DIAGNOSIS — G546 Phantom limb syndrome with pain: Secondary | ICD-10-CM | POA: Diagnosis not present

## 2023-07-08 DIAGNOSIS — R2689 Other abnormalities of gait and mobility: Secondary | ICD-10-CM

## 2023-07-08 DIAGNOSIS — Z7409 Other reduced mobility: Secondary | ICD-10-CM

## 2023-07-08 NOTE — Therapy (Cosign Needed)
OUTPATIENT PHYSICAL THERAPY PROSTHETIC TREATMENT    Patient Name: Susan [name] MRN: [id number] DOB:07-Apr-1951, 73 y.o., female Today's Date: 07/08/2023  PCP: Donita Brooks, MD  REFERRING PROVIDER: Nadara Mustard, MD   END OF SESSION:  PT End of Session - 07/08/23 1512     Visit Number 26    Number of Visits 44    Date for PT Re-Evaluation 09/10/23    Authorization Type [employer]    Progress Note Due on Visit 30    PT Start Time 1433    PT Stop Time 1512    PT Time Calculation (min) 39 min    Equipment Utilized During Treatment Gait belt    Activity Tolerance Patient tolerated treatment well;Patient limited by fatigue    Behavior During Therapy WFL for tasks assessed/performed              Past Medical History:  Diagnosis Date   Anemia    hx   Anxiety    Arthritis    "generalized" (03/15/2014)   CAD (coronary artery disease)    MI in 2000 - MI  2007 - treated bare metal stent (no nuclear since then as 9/11)   Carotid artery disease (HCC)    Chronic diastolic heart failure (HCC)    a) ECHO (08/2013) EF 55-60% and RV function nl b) RHC (08/2013) RA 4, RV 30/5/7, PA 25/10 (16), PCWP 7, Fick CO/CI 6.3/2.7, PVR 1.5 WU, PA 61 and 66%   Daily headache    "~ every other day; since I fell in June" (03/15/2014)   Depression    Diabetic retinopathy (HCC)    Dyslipidemia    ESRD (end stage renal disease) (HCC)    Dialysis on Tues Thurs Sat   Exertional shortness of breath    GERD (gastroesophageal reflux disease)    History of blood transfusion    History of kidney stones    HTN (hypertension)    Hypothyroidism    Myocardial infarction (HCC)    Obesity    Osteoarthritis    PAF (paroxysmal atrial fibrillation) (HCC)    Peripheral neuropathy    bilateral feet/hands   PONV (postoperative nausea and vomiting)    RBBB (right bundle branch block)    Old   Stroke (HCC)    mini strokes   Type II diabetes mellitus (HCC)    Type II, [name] left  upper arm. patient has omnipod insulin pump with Novolin R Insulin   Past Surgical History:  Procedure Laterality Date   A/V FISTULAGRAM Left 11/07/2022   Procedure: A/V Fistulagram;  Surgeon: Leonie Douglas, MD;  [facility]: [facility];  Service: Cardiovascular;  Laterality: Left;   ABDOMINAL HYSTERECTOMY  1980's   AMPUTATION Right 02/24/2018   Procedure: RIGHT FOOT GREAT TOE AND 2ND TOE AMPUTATION;  Surgeon: Nadara Mustard, MD;  Location: [facility] OR;  Service: Orthopedics;  Laterality: Right;   AMPUTATION Right 04/30/2018   Procedure: RIGHT TRANSMETATARSAL AMPUTATION;  Surgeon: Nadara Mustard, MD;  Location: [facility] OR;  Service: Orthopedics;  Laterality: Right;   AMPUTATION Right 05/02/2022   Procedure: RIGHT BELOW KNEE AMPUTATION;  Surgeon: Nadara Mustard, MD;  Location: [facility] OR;  Service: Orthopedics;  Laterality: Right;   APPLICATION OF WOUND VAC Right 06/13/2022   Procedure: APPLICATION OF WOUND VAC;  Surgeon: Nadara Mustard, MD;  Location: [facility] OR;  Service: Orthopedics;  Laterality: Right;   APPLICATION OF WOUND VAC Left 11/14/2022   Procedure: APPLICATION OF WOUND  VAC;  Surgeon: Nadara Mustard, MD;  Location: [facility] OR;  Service: Orthopedics;  Laterality: Left;   AV FISTULA PLACEMENT Left 04/02/2022   Procedure: LEFT ARM ARTERIOVENOUS (AV) FISTULA CREATION;  Surgeon: Nada Libman, MD;  Location: [facility] OR;  Service: Vascular;  Laterality: Left;  PERIPHERAL NERVE BLOCK   AV FISTULA PLACEMENT Right 04/27/2023   Procedure: RIGHT ARM BRACHIOBASILIC ARTERIOVENOUS (AV) FISTULA CREATION;  Surgeon: Leonie Douglas, MD;  Location: [facility] OR;  Service: Vascular;  Laterality: Right;   BASCILIC VEIN TRANSPOSITION Left 07/31/2022   Procedure: LEFT ARM SECOND STAGE BASILIC VEIN TRANSPOSITION;  Surgeon: Nada Libman, MD;  Location: [facility] OR;  Service: Vascular;  Laterality: Left;   BIOPSY  05/27/2020   Procedure: BIOPSY;  Surgeon: Lanelle Bal, DO;  [facility]: [facility];  Service: Endoscopy;;   CATARACT  EXTRACTION, BILATERAL Bilateral ?2013   COLONOSCOPY W/ POLYPECTOMY     COLONOSCOPY WITH PROPOFOL N/A 03/13/2019   Procedure: COLONOSCOPY WITH PROPOFOL;  Surgeon: Beverley Fiedler, MD;  Location: [facility] ENDOSCOPY;  Service: Gastroenterology;  Laterality: N/A;   CORONARY ANGIOPLASTY WITH STENT PLACEMENT  1999; 2007   "1 + 1"   ERCP N/A 02/03/2022   Procedure: ENDOSCOPIC RETROGRADE CHOLANGIOPANCREATOGRAPHY (ERCP);  Surgeon: Jeani Hawking, MD;  Location: [facility] ENDOSCOPY;  Service: Gastroenterology;  Laterality: N/A;   ESOPHAGOGASTRODUODENOSCOPY N/A 02/12/2023   Procedure: ESOPHAGOGASTRODUODENOSCOPY (EGD);  Surgeon: Jeani Hawking, MD;  Location: [facility] ENDOSCOPY;  Service: Gastroenterology;  Laterality: N/A;   ESOPHAGOGASTRODUODENOSCOPY (EGD) WITH PROPOFOL N/A 03/13/2019   Procedure: ESOPHAGOGASTRODUODENOSCOPY (EGD) WITH PROPOFOL;  Surgeon: Beverley Fiedler, MD;  Location: [facility] ENDOSCOPY;  Service: Gastroenterology;  Laterality: N/A;   ESOPHAGOGASTRODUODENOSCOPY (EGD) WITH PROPOFOL N/A 05/27/2020   Procedure: ESOPHAGOGASTRODUODENOSCOPY (EGD) WITH PROPOFOL;  Surgeon: Lanelle Bal, DO;  [facility];  Service: Endoscopy;  Laterality: N/A;   ESOPHAGOGASTRODUODENOSCOPY (EGD) WITH PROPOFOL N/A 09/03/2022   Procedure: ESOPHAGOGASTRODUODENOSCOPY (EGD) WITH PROPOFOL;  Surgeon: Jeani Hawking, MD;  Location: [facility] ENDOSCOPY;  Service: Gastroenterology;  Laterality: N/A;   EYE SURGERY Bilateral    lazer   FLEXIBLE SIGMOIDOSCOPY N/A 05/23/2022   Procedure: FLEXIBLE SIGMOIDOSCOPY;  Surgeon: Jeani Hawking, MD;  Location: [facility] ENDOSCOPY;  Service: Gastroenterology;  Laterality: N/A;   FLEXIBLE SIGMOIDOSCOPY N/A 05/24/2022   Procedure: FLEXIBLE SIGMOIDOSCOPY;  Surgeon: Imogene Burn, MD;  Location: [facility] ENDOSCOPY;  Service: Gastroenterology;  Laterality: N/A;   FLEXIBLE SIGMOIDOSCOPY N/A 09/03/2022   Procedure: FLEXIBLE SIGMOIDOSCOPY;  Surgeon: Jeani Hawking, MD;  Location: [facility] ENDOSCOPY;  Service: Gastroenterology;  Laterality:  N/A;   HEMOSTASIS CLIP PLACEMENT  03/13/2019   Procedure: HEMOSTASIS CLIP PLACEMENT;  Surgeon: Beverley Fiedler, MD;  Location: [facility] ENDOSCOPY;  Service: Gastroenterology;;   HEMOSTASIS CLIP PLACEMENT  05/23/2022   Procedure: HEMOSTASIS CLIP PLACEMENT;  Surgeon: Jeani Hawking, MD;  Location: [facility] ENDOSCOPY;  Service: Gastroenterology;;   HEMOSTASIS CONTROL  05/24/2022   Procedure: HEMOSTASIS CONTROL;  Surgeon: Imogene Burn, MD;  Location: [facility] ENDOSCOPY;  Service: Gastroenterology;;   HOT HEMOSTASIS N/A 05/23/2022   Procedure: HOT HEMOSTASIS (ARGON PLASMA COAGULATION/BICAP);  Surgeon: Jeani Hawking, MD;  Location: [facility] ENDOSCOPY;  Service: Gastroenterology;  Laterality: N/A;   I & D EXTREMITY Left 05/05/2022   Procedure: IRRIGATION AND DEBRIDEMENT LEFT ARM AV FISTULA;  Surgeon: Cephus Shelling, MD;  Location: [facility] OR;  Service: Vascular;  Laterality: Left;   I & D EXTREMITY N/A 11/14/2022   Procedure: IRRIGATION AND DEBRIDEMENT OF LOWER EXTREMITY WOUND;  Surgeon: Nadara Mustard, MD;  Location: [facility]  OR;  Service: Orthopedics;  Laterality: N/A;   INSERTION OF DIALYSIS CATHETER Right 04/02/2022   Procedure: INSERTION OF TUNNELED DIALYSIS CATHETER;  Surgeon: Nada Libman, MD;  Location: [facility] OR;  Service: Vascular;  Laterality: Right;   KNEE ARTHROSCOPY Left 10/25/2006   POLYPECTOMY  03/13/2019   Procedure: POLYPECTOMY;  Surgeon: Beverley Fiedler, MD;  Location[facility] ENDOSCOPY;  Service: Gastroenterology;;   REMOVAL OF STONES  02/03/2022   Procedure: REMOVAL OF STONES;  Surgeon: Jeani Hawking, MD;  Location: [facility] ENDOSCOPY;  Service: Gastroenterology;;   REVISON OF ARTERIOVENOUS FISTULA Left 08/20/2022   Procedure: REVISON OF LEFT ARM ARTERIOVENOUS FISTULA;  Surgeon: Nada Libman, MD;  Location: [facility] OR;  Service: Vascular;  Laterality: Left;   RIGHT HEART CATH N/A 07/24/2017   Procedure: RIGHT HEART CATH;  Surgeon: Dolores Patty, MD;  [facility]: [facility];  Service: Cardiovascular;  Laterality: N/A;    RIGHT HEART CATHETERIZATION N/A 09/22/2013   Procedure: RIGHT HEART CATH;  Surgeon: Dolores Patty, MD;  Location: [facility] CATH LAB;  Service: Cardiovascular;  Laterality: N/A;   SHOULDER ARTHROSCOPY WITH OPEN ROTATOR CUFF REPAIR Right 03/14/2014   Procedure: RIGHT SHOULDER ARTHROSCOPY WITH BICEPS RELEASE, OPEN SUBSCAPULA REPAIR, OPEN SUPRASPINATUS REPAIR.;  Surgeon: Cammy Copa, MD;  Location: [facility] OR;  Service: Orthopedics;  Laterality: Right;   SPHINCTEROTOMY  02/03/2022   Procedure: SPHINCTEROTOMY;  Surgeon: Jeani Hawking, MD;  Location: [facility] ENDOSCOPY;  Service: Gastroenterology;;   STUMP REVISION Right 06/13/2022   Procedure: REVISION RIGHT BELOW KNEE AMPUTATION;  Surgeon: Nadara Mustard, MD;  Location: [facility] OR;  Service: Orthopedics;  Laterality: Right;   TEE WITHOUT CARDIOVERSION N/A 02/04/2022   Procedure: TRANSESOPHAGEAL ECHOCARDIOGRAM (TEE);  Surgeon: Dolores Patty, MD;  Location: [facility] ENDOSCOPY;  Service: Cardiovascular;  Laterality: N/A;   THROMBECTOMY W/ EMBOLECTOMY Left 08/20/2022   Procedure: THROMBECTOMY OF LEFT ARM ARTERIOVENOUS FISTULA;  Surgeon: Nada Libman, MD;  Location: [facility] OR;  Service: Vascular;  Laterality: Left;   TOE AMPUTATION Right 02/24/2018   GREAT TOE AND 2ND TOE AMPUTATION   TUBAL LIGATION  1970's   Patient Active Problem List   Diagnosis Date Noted   Atrial fibrillation (HCC) 06/17/2023   Atopic dermatitis 06/17/2023   Hyperosmolar hyperglycemic state (HHS) (HCC) 02/05/2023   Gastroparesis 02/05/2023   Depression 01/29/2023   Intractable nausea and vomiting 01/20/2023   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) 04/07/2022  Allergy, unspecified, initial encounter 04/07/2022   Dependence on renal dialysis (HCC) 04/07/2022   Gout due to renal impairment, unspecified site 04/07/2022   Hypertensive heart and chronic kidney disease with heart failure and with stage 5 chronic kidney disease, or end stage renal disease (HCC) 04/07/2022   Personal history of transient ischemic attack (TIA), and cerebral infarction without residual deficits 04/07/2022   Renal osteodystrophy 04/07/2022   Venous stasis ulcer of right calf (HCC) 03/31/2022   Fistula, colovaginal 03/26/2022   Diarrhea 03/26/2022   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   Sepsis without acute organ dysfunction (HCC)    Bacteremia    Acute pancreatitis 02/01/2022   Abdominal pain 02/01/2022   SIRS (systemic inflammatory response syndrome) (HCC) 02/01/2022   Transaminitis 02/01/2022   History of anemia due to chronic kidney disease 02/01/2022   Paroxysmal atrial fibrillation (HCC) 02/01/2022   Uncontrolled type 2 diabetes mellitus with hyperglycemia, with long-term current use of insulin (HCC) 01/14/2022   NSTEMI (non-ST  elevated myocardial infarction) (HCC) 03/05/2021   Acute renal failure superimposed on stage 4 chronic kidney disease (HCC) 08/22/2020   Hypoalbuminemia 05/25/2020   GERD (gastroesophageal reflux disease) 05/25/2020   Pressure injury of skin 05/17/2020   Acute on chronic combined systolic and diastolic congestive heart failure (HCC) 03/07/2020   Type 2 diabetes mellitus with diabetic polyneuropathy, with long-term current use of insulin (HCC) 03/07/2020   Obesity, Class III, BMI 40-49.9 (morbid obesity) (HCC) 03/07/2020   Common bile duct (CBD) obstruction 05/28/2019   Benign neoplasm of ascending colon    Benign neoplasm of transverse colon    Benign neoplasm of descending colon    Benign neoplasm of sigmoid colon    Gastric polyps    Hyperkalemia 03/11/2019   Prolonged QT interval 03/11/2019   Acute blood loss anemia 03/11/2019   Onychomycosis 06/21/2018   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)    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 (HCC)    PVD (peripheral vascular disease) (HCC) 10/26/2017   E-coli UTI 07/27/2017   Hypothyroidism 07/27/2017   AKI (acute kidney injury) (HCC)    PAH (pulmonary artery hypertension) (HCC)    Impaired ambulation 07/19/2017   Leg cramps 02/27/2017   Peripheral edema 01/12/2017   Diabetic neuropathy (HCC) 11/12/2016   CKD (chronic kidney disease), stage IV (HCC) 10/24/2015   Anemia of chronic disease 10/03/2015   Historical diagnosis of generalized anxiety disorder 10/03/2015   Secondary insomnia 10/03/2015   Acute bronchitis 09/05/2015   Hyperglycemia due to diabetes mellitus (HCC) 06/07/2015   Non compliance with medical treatment 04/17/2014   Rotator cuff tear 03/14/2014   Obesity 09/23/2013   Chronic HFrEF (heart failure with reduced ejection fraction) (HCC) 06/03/2013   Hypotension 12/25/2012   Hyponatremia 12/25/2012   Urinary incontinence    MDD (major depressive disorder), recurrent episode, moderate  (HCC) 11/12/2010   RBBB (right bundle branch block)    Wide-complex tachycardia    Coronary artery disease    Hyperlipemia 01/22/2009   Chronic hypotension 01/22/2009    ONSET DATE: 03/09/2023 MD referral to PT  REFERRING DIAG: S88.111D (ICD-10-CM) - Below-knee amputation of right lower extremity, subsequent encounter   THERAPY DIAG:  Unsteadiness on feet  Muscle weakness (generalized)  Impaired functional mobility, balance, gait, and endurance  Right below-knee amputee (HCC)  Other abnormalities of gait and mobility  Phantom limb syndrome with pain (HCC)  Rationale for Evaluation and Treatment: Rehabilitation  SUBJECTIVE:  SUBJECTIVE STATEMENT: States she has more chest  congestion and shortness of breath but besides that feels ok.  Husband, Joe present at session.   PERTINENT HISTORY: Right TTA 05/02/22, Lymphedema, idiopathic chronic venous HTN with ulcer, depression, colitis, diverticulitis, ESRD, gout, NSTEMI, DM2, polyneuropathy, PVD, CAD, right bundle branch block, mini strokes  PAIN:  Are you having pain? No pain today.   PRECAUTIONS: Fall and Other: No BP LUE  WEIGHT BEARING RESTRICTIONS: No  FALLS: Has patient fallen in last 6 months? No  LIVING ENVIRONMENT: Lives with: lives with their spouse and 2 small dogs Lives in: mobile home Home Access: Ramped entrance Home layout: One level Stairs: Yes: External: 6-7 steps; can reach both Has following equipment at home: Single point cane, Environmental consultant - 2 wheeled, Environmental consultant - 4 wheeled, Wheelchair (manual), [employer], Grab bars, and Ramped entry  OCCUPATION:  retired  PLOF: prior to amputation for ~year used RW in home & community  PATIENT GOALS:  to use prosthesis to walk, get out w/c in & out of car, get to bathroom.   OBJECTIVE:  COGNITION: Overall cognitive status:  Eval on 03/23/2023:   Within functional limits for tasks assessed  POSTURE:  Eval on 03/23/2023:  rounded shoulders, forward head, increased  thoracic kyphosis, flexed trunk , and weight shift left  LOWER EXTREMITY ROM: ROM Right eval Left eval  Hip flexion    Hip extension    Hip abduction    Hip adduction    Hip internal rotation    Hip external rotation    Knee flexion    Knee extension    Ankle dorsiflexion    Ankle plantarflexion    Ankle inversion    Ankle eversion     (Blank rows = not tested)  LOWER EXTREMITY MMT:  MMT Right eval Left eval  Hip flexion 3-/5 3-/5  Hip extension 2/5 2/5  Hip abduction 2+/5 2+/5  Hip adduction    Hip internal rotation    Hip external rotation    Knee flexion 3-/5 3-/5  Knee extension 3-/5 3-/5  Ankle dorsiflexion    Ankle plantarflexion    Ankle inversion    Ankle eversion    At Evaluation all strength testing is grossly seated and functionally standing / gait. (Blank rows = not tested)  TRANSFERS: 06/15/2023: -Sit to stand transfers with RW wheelchair<>mat table at varied height: 19in with maxA to rise to stand; 22in ModA rise to stand, 24in min-modA rise to stand. -stand pivot transfer with RW w/c to mat table with modA once standing.  -Lateral scooting wc<>table at level height SBA; vc for LE positioning for optimal use of LE during transfer -lateral scoot R: modA due to fatigue and uneven surface transfer from low surface to higher surface   05/04/2023: Squat pivot transfers with modA.  04/21/2023: Pt sit to stand w/c to //bar with one hand pushing on w/c and other hand on //bar with modA first time & MinA 2nd/ 3rd time.  Best is RUE on //bar & LUE on w/c.  Stand to sit reaching LUE to w/c with minA to control descent.   PT demo & verbal cues on sit to/from stand w/c to RW.  1st time modA 2 person. 2nd time maxA one person.  Stand to sit with minA and constant cueing. Scooting transfers with minA with w/c & mat direct contact & same ht.   Eval on 03/23/2023:  Sit to stand: Max A (two-person assist) from 22" w/c to rolling walker Stand to sit: Max A  (two-person assist)  rolling walker to wheelchair  FUNCTIONAL TESTs:   06/15/2023: -Static stance for standing with RW; requiring Mod-MaxA for rise to stand d/t patient unsteady on feet posterior lean without proper righting strategy, SBA for stand   04/21/2023: Pt able to stand 60 sec with BUE on //bars with supervision.  Pt able to stand for 1 min with RW support with minA.  Eval on 03/23/2023: Patient maintained upright holding rolling walker with mod assist 2 people for safety for 30 seconds.  GAIT: 06/17/2023: Patient ambulated 84ft (maximal tolerated distance) with RW ModA (2 person for safety). Required VC for LE sequencing and minA to steer walker and prevent tip over.   04/21/2023: Pt amb 5' in //bars with modA (2 person for safety) with PT verbal & tactile cues on sequence and weight shift.  Pt amb 10' with RW with maxA (2 person for safety) with PT verbal & tactile cues on sequence and weight shift.  Eval on 03/23/2023:  Patient took 2 steps (one per LE) with +2 max assist with rolling walker and TTA prosthesis.  Patient adducting prosthesis stepping on left foot with no awareness.  CURRENT PROSTHETIC WEAR ASSESSMENT: 06/17/2023:  Independence with prosthetic care:  Patient and husband are independent with: skin check, prosthetic cleaning, ply sock cleaning, proper wear schedule/adjustment Patient is dependent with: residual limb care, correct ply sock adjustment, and proper weight-bearing schedule/adjustment Donning prosthesis: husband is independent with proper donning. Patient requires MaxA. Patient can verbally direct someone how to properly assist. Doffing prosthesis: Husband is independent with proper donning. Patient requires modA. Patient can verbally direct someone how to properly assist. Prosthetic wear tolerance: majority of awake hours including dialysis hours, 1x/day, 3-4 days/week Prosthetic weight bearing tolerance: able to tolerate of standing  without complains of prosthetic limb pain. Time not limited by limb discomfort but limited by muscle fatigue.   Eval on 03/23/2023:   Patient is dependent with: skin check, residual limb care, prosthetic cleaning, ply sock cleaning, correct ply sock adjustment, proper wear schedule/adjustment, and proper weight-bearing schedule/adjustment Donning prosthesis: Max A Doffing prosthesis: Min A Prosthetic wear tolerance: 2-6 hours, 1x/day, 3-4 days/week Prosthetic weight bearing tolerance: 3 minutes with limb pain Edema: pitting edema Residual limb condition: No open areas, dry skin, normal color and temperature, cylindrical shape Prosthetic description: Silicone liner with pin lock suspension, total contact socket with flexible inner socket, sach foot    TODAY'S  TODAY'S  TREATMENT:                                                                                                                             DATE: 07/08/2023  Therapeutic Activities: Patient ambulated 22' with 90* turn to orient to path and 90* to position in front of w/c with RW modA during turns and MinA straight path. walked from outside of bathroom to toilet to sit to build patient confidence in ability at home in the future. PT cues on sequencing  and weight shift especially during turns.  Sit to Stand from wheelchair required modA to prevent posterior LOB. Use of railing to sit and stand from toilet requiring mod-maxA to rise due to low surface without grab bar on L side.  Static standing to work on ability to maintain balance while husband assists at home in future. Patient able to tolerate standing with RW support with supervision for 3 min to allow time for simulation of hygienic cleaning. Required long duration seated rest break on toilet after stand with patient demonstrating seated balance unsupported however with fatigue patient did rest trunk / back on commode.  Patient able to ambulate 41' with RW back out to wheelchair  minA/CGA straight path and requiring min-modA for turning to sit in chair.    Self care: Patient and husband educated on use of BSC for over the toilet as it would raise the height and has armrests for patient to use for ease of sit/stand transfer. Also educated on using stool under feet while seated on toilet to aid with her ability for BM.  Patient and husband instructed to figure out height of toilet at home and [facility] for safe practice in clinic.     TREATMENT:                                                                                                                             DATE: 07/06/2023  Vitals: Baseline 97% SpO2, 87 HR SpO2 98% 80 HR after 2 bouts of stand with feet walk in SpO2  100% 78 HR after bouts of standing balance Therapeutic Activities: Sit to stand w/c to RW with LE walk in x5, ModA to begin progressing to 2 bouts of minA and 1 bout of CGA;  Stand with 1UE tap / reaching forward to therapist hand (shoulder flexion within her arm length)  x4 (2 ea); CGA with RW support.   Neuromuscular Re-education: Standing balance with RW support - looking right/left x5, up/down x5, diagonal x5 ~68min total of standing; modA to MaxA with patient losing balance posteriorly when looking up.  Seated rest then Standing with RW support - scanning up/down x5 with diagonal L looking x5; MinA to CGA as patient progressed. Static stance with RW with eyes closed 10 sec 3 reps with CGA.  Car transfer Attempted however after assessment of patients car, the seat is too high, even with the car kneeling, for the patient to safely enter car via RW.   TREATMENT:  DATE: 07/01/2023 Therapeutic Activities: Seated anterior posterior leans in wc x5 Seated rotations x5ea R/L vc for increasing rotation by asking pt to locate objects over shoulder Seated reach side bend to floor with UE  hover for safety x5ea; vc for increased reach to floor and weight shift Sit to stand with LE feet walk in from 18in chair with armrest to stand upright with RW x6; required ModA progressing to MinA for last 2 attempts. VC for weight shift to unload limb before attempt to step LE in. Also required increased vc for sequencing of UE from armrest to RW at times requiring modA to prevent RW tipover Ambulated with RW CGA 50ft with wc follow; visual cues present on RW  Transferred stand pivot from wc to 18in chair min-modA for rise to stand, vc for sequencing required   PATIENT EDUCATION: PATIENT EDUCATED ON FOLLOWING PROSTHETIC CARE: Education details: Use of stockinette under proximal liner to decrease itching sensation, no wounds presents to PT recommended not using Vive wear under liner Skin check, Prosthetic cleaning, Propper donning, and Proper wear schedule/adjustment Prosthetic wear tolerance: 3 hours 2x/day, 4 days/week Person educated: Patient and Spouse Education method: Explanation, Tactile cues, and Verbal cues Education comprehension: verbalized understanding, verbal cues required, tactile cues required, and needs further education  HOME EXERCISE PROGRAM: Access Code: [email or web address] URL: [email or web address] Date: 04/15/2023 Prepared by: Vladimir Faster  Exercises - Supine Bridge  - 1 x daily - 4 x weekly - 2 sets - 5 reps - 2 seconds hold - Supine Lower Trunk Rotation  - 1 x daily - 4 x weekly - 2 sets - 5 reps - 5 seconds hold - Supine Heel Slide with Strap  - 1 x daily - 4 x weekly - 2 sets - 5 reps - 2-3 seconds hold - Supine Hip Abduction  - 1 x daily - 4 x weekly - 2 sets - 5 reps - 2-3 seconds hold - Seated Hip Flexion Toward Target  - 1 x daily - 4 x weekly - 2 sets - 5 reps - 5 seconds hold - Seated Eccentric Abdominal Lean Back  - 1 x daily - 4 x weekly - 2 sets - 5 reps - 5 seconds hold - Seated Sidebending  - 1 x daily - 4 x weekly - 2 sets - 5 reps - 5 seconds  hold - Seated Trunk Rotation with Crossed Arms  - 1 x daily - 4 x weekly - 2 sets - 5 reps - 5 seconds hold   ASSESSMENT:  CLINICAL IMPRESSION:  Despite patients hesitation with ambulation to bathroom, patient was able to complete task without major issues demonstrating increased confidence and in return reducing caretaker burden. Patient will require additional practice before attempting at home with husband. Patient continues to make progress towards goals and will benefit from continued skilled physical therapy to address deficits.   OBJECTIVE IMPAIRMENTS: Abnormal gait, decreased activity tolerance, decreased balance, decreased endurance, decreased knowledge of condition, decreased knowledge of use of DME, decreased mobility, difficulty walking, decreased ROM, decreased strength, decreased safety awareness, postural dysfunction, prosthetic dependency , and pain.   ACTIVITY LIMITATIONS: standing, transfers, and locomotion level  PARTICIPATION LIMITATIONS: community activity, household mobility and dependency / burden of care on family  PERSONAL FACTORS: Age, Fitness, Past/current experiences, Time since onset of injury/illness/exacerbation, and 3+ comorbidities: see PMH  are also affecting patient's functional outcome.   REHAB POTENTIAL: Good  CLINICAL DECISION MAKING: Evolving/moderate complexity  EVALUATION COMPLEXITY: Moderate   GOALS: Goals reviewed  with patient? Yes  SHORT TERM GOALS: Target date: 07/15/2023:  Patient & husband verbalize proper residual limb care and understanding for adjusting ply socks.  Baseline: SEE OBJECTIVE DATA Goal status:  ongoing 07/06/2023 2.  Patient able to sit to stand w/c to rolling walker with 1 person modA and maintain upright with RW for 4 min with SBA to assist with toileting needs/hygienic cleaning.  Baseline: SEE OBJECTIVE DATA Goal status: partially met 07/08/2023  3. Patient ambulates 106ft including turning 90* to position to sit with  RW & prosthesis with modA to navigate reaching bathroom in home.  Baseline: SEE OBJECTIVE DATA Goal status: partially met 07/08/2023   LONG TERM GOALS: Target date: 09/10/2023  Patient & husband demonstrates & verbalizes understanding of prosthetic care to enable safe utilization of prosthesis. Baseline: SEE OBJECTIVE DATA Goal status: ongoing 07/06/2023  Patient tolerates prosthesis wear >80% of awake hours on non-dialysis days and >50% on dialysis days without skin issues and limb pain </= 4/10. Baseline: SEE OBJECTIVE DATA Goal status: MET 06/15/2023  Scooting transfers with minA and sit to/from stand transfers with modA.  Baseline: SEE OBJECTIVE DATA - scooting met but sit to stand MaxA Goal status: ongoing 07/06/2023  Patient ambulates >58ft including turning 90* to position to sit with prosthesis & RW with modA for household navigation.  Baseline: SEE OBJECTIVE DATA Goal status: ongoing 07/06/2023  Patient able to maintain static stance with RW support with SBA for 5 min to assist with hygienic cleaning.  Baseline: SEE OBJECTIVE DATA Goal status:  ongoing 07/06/2023   PLAN:  PT FREQUENCY: 2x/week  PT DURATION: 12 weeks  PLANNED INTERVENTIONS: 97164- PT Re-evaluation, 97110-Therapeutic exercises, 97530- Therapeutic activity, 97112- Neuromuscular re-education, [phone]- Self Care, 60454- Gait training, [id number]- Prosthetic training, Patient/Family education, Balance training, DME instructions, Therapeutic exercises, Therapeutic activity, Neuromuscular re-education, Gait training, and Self Care  PLAN FOR NEXT SESSION:  check STGs,  continue STS including 18" chairs with armrests, bathroom navigation beginning husband assisting under PT guidance, standing balance for hygienic cleaning (hold RW with slight perturbations applied)   Messiyah Waterson, Kavan Devan, Student-PT 07/08/2023, 5:29 PM  This entire session of physical therapy was performed under the direct supervision of PT signing evaluation  /treatment. PT reviewed note and agrees.   Vladimir Faster, PT 07/09/2023, 9:30 AM

## 2023-07-10 ENCOUNTER — Ambulatory Visit (INDEPENDENT_AMBULATORY_CARE_PROVIDER_SITE_OTHER): Payer: PPO | Admitting: Physician Assistant

## 2023-07-10 ENCOUNTER — Ambulatory Visit (HOSPITAL_COMMUNITY)
Admission: RE | Admit: 2023-07-10 | Discharge: 2023-07-10 | Disposition: A | Discharge status: Home / Self Care | Payer: PPO | Source: Ambulatory Visit | Attending: Vascular Surgery | Admitting: Vascular Surgery

## 2023-07-10 VITALS — BP 108/52 | HR 73 | Temp 98.2°F | Ht 65.0 in | Wt 220.0 lb

## 2023-07-10 DIAGNOSIS — N186 End stage renal disease: Secondary | ICD-10-CM | POA: Diagnosis not present

## 2023-07-10 DIAGNOSIS — T82590A Other mechanical complication of surgically created arteriovenous fistula, initial encounter: Secondary | ICD-10-CM

## 2023-07-10 DIAGNOSIS — Z992 Dependence on renal dialysis: Secondary | ICD-10-CM | POA: Diagnosis not present

## 2023-07-10 NOTE — Progress Notes (Signed)
    Postoperative Access Visit   History of Present Illness   EARSIE HUMM is a 73 y.o. year old female who presents for postoperative follow-up for: right brachiobasilic AV fistula creation by Dr. Lenell Antu on 04/27/23.  The patient's wounds are well healed.  The patient notes no steal symptoms.  She has a prior failed left basilic vein fistula.  Currently dialyzing via right internal jugular TDC on TTS at the [place] [facility]  Physical Examination   Vitals:   07/10/23 1503  BP: (!) 108/52  Pulse: 73  Temp: 98.2 F (36.8 C)  SpO2: 93%  Weight: 220 lb (99.8 kg)  Height: 5\' 5"  (1.651 m)   Body mass index is 36.61 kg/m.  right arm Incision is well healed, 2+ radial pulse, hand grip is 5/5, sensation in digits is intact, no palpable thrill, bruit cannot be auscultated    Non invasive vascular lab:  Findings:  +--------------------+----------+-----------------+--------+  AVF                PSV (cm/s)Flow Vol (mL/min)Comments  +--------------------+----------+-----------------+--------+  Native artery inflow   103            7                  +--------------------+----------+-----------------+--------+  AVF Anastomosis         15                               +--------------------+----------+-----------------+--------+  Summary:  Diminished outflow . No color flow noted at the anastamosis. No palpable thrill consistent with occlusion      Medical Decision Making   NICK ARMEL is a 73 y.o. year old female who presents s/p  right brachiobasilic AV fistula creation by Dr. Lenell Antu on 04/27/23. Her incision is well healed. No signs or symptoms of steal. 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 Before placement of AV graft would recommend bilateral upper extremity venogram to define any central venous issues  She is on Eliquis Will arrange BUE Venogram in the  near future on a non dialysis day. She has requested those to be scheduled on a Friday if possible   Graceann Congress, PA-C [facility] Office: [phone]  On call MD: Hetty Blend

## 2023-07-13 ENCOUNTER — Telehealth: Payer: Self-pay

## 2023-07-13 ENCOUNTER — Ambulatory Visit: Payer: PPO | Admitting: Cardiovascular Disease

## 2023-07-13 ENCOUNTER — Other Ambulatory Visit: Payer: Self-pay

## 2023-07-13 DIAGNOSIS — N186 End stage renal disease: Secondary | ICD-10-CM

## 2023-07-13 MED ORDER — SODIUM CHLORIDE 0.9% FLUSH: 3.0000 mL | INTRAVENOUS | Status: DC | PRN

## 2023-07-13 MED ORDER — SODIUM CHLORIDE 0.9 % IV SOLN: 250.0000 mL | INTRAVENOUS | Status: AC | PRN

## 2023-07-13 MED ORDER — SODIUM CHLORIDE 0.9% FLUSH
3.0000 mL | Freq: Two times a day (BID) | INTRAVENOUS | Status: DC
Start: 2023-07-13 — End: 2023-08-06

## 2023-07-13 NOTE — Telephone Encounter (Signed)
 Attempted to call for surgery scheduling. LVM

## 2023-07-14 DIAGNOSIS — D689 Coagulation defect, unspecified: Secondary | ICD-10-CM | POA: Diagnosis not present

## 2023-07-14 DIAGNOSIS — D631 Anemia in chronic kidney disease: Secondary | ICD-10-CM | POA: Diagnosis not present

## 2023-07-14 DIAGNOSIS — E1122 Type 2 diabetes mellitus with diabetic chronic kidney disease: Secondary | ICD-10-CM | POA: Diagnosis not present

## 2023-07-14 DIAGNOSIS — N186 End stage renal disease: Secondary | ICD-10-CM | POA: Diagnosis not present

## 2023-07-14 DIAGNOSIS — E039 Hypothyroidism, unspecified: Secondary | ICD-10-CM | POA: Diagnosis not present

## 2023-07-14 DIAGNOSIS — Z992 Dependence on renal dialysis: Secondary | ICD-10-CM | POA: Diagnosis not present

## 2023-07-14 DIAGNOSIS — N2581 Secondary hyperparathyroidism of renal origin: Secondary | ICD-10-CM | POA: Diagnosis not present

## 2023-07-14 DIAGNOSIS — D509 Iron deficiency anemia, unspecified: Secondary | ICD-10-CM | POA: Diagnosis not present

## 2023-07-15 ENCOUNTER — Encounter: Payer: PPO | Admitting: Physical Therapy

## 2023-07-16 ENCOUNTER — Other Ambulatory Visit (HOSPITAL_COMMUNITY): Payer: Self-pay | Admitting: Cardiovascular Disease

## 2023-07-16 DIAGNOSIS — I5032 Chronic diastolic (congestive) heart failure: Secondary | ICD-10-CM

## 2023-07-16 DIAGNOSIS — I10 Essential (primary) hypertension: Secondary | ICD-10-CM

## 2023-07-17 DIAGNOSIS — E11621 Type 2 diabetes mellitus with foot ulcer: Secondary | ICD-10-CM | POA: Diagnosis not present

## 2023-07-17 DIAGNOSIS — E11319 Type 2 diabetes mellitus with unspecified diabetic retinopathy without macular edema: Secondary | ICD-10-CM | POA: Diagnosis not present

## 2023-07-17 DIAGNOSIS — E7800 Pure hypercholesterolemia, unspecified: Secondary | ICD-10-CM | POA: Diagnosis not present

## 2023-07-17 DIAGNOSIS — N186 End stage renal disease: Secondary | ICD-10-CM | POA: Diagnosis not present

## 2023-07-17 DIAGNOSIS — R748 Abnormal levels of other serum enzymes: Secondary | ICD-10-CM | POA: Diagnosis not present

## 2023-07-17 DIAGNOSIS — E1165 Type 2 diabetes mellitus with hyperglycemia: Secondary | ICD-10-CM | POA: Diagnosis not present

## 2023-07-17 DIAGNOSIS — I10 Essential (primary) hypertension: Secondary | ICD-10-CM | POA: Diagnosis not present

## 2023-07-17 DIAGNOSIS — I2510 Atherosclerotic heart disease of native coronary artery without angina pectoris: Secondary | ICD-10-CM | POA: Diagnosis not present

## 2023-07-17 DIAGNOSIS — E039 Hypothyroidism, unspecified: Secondary | ICD-10-CM | POA: Diagnosis not present

## 2023-07-17 DIAGNOSIS — G609 Hereditary and idiopathic neuropathy, unspecified: Secondary | ICD-10-CM | POA: Diagnosis not present

## 2023-07-19 DIAGNOSIS — I5022 Chronic systolic (congestive) heart failure: Secondary | ICD-10-CM | POA: Diagnosis not present

## 2023-07-19 DIAGNOSIS — I2721 Secondary pulmonary arterial hypertension: Secondary | ICD-10-CM | POA: Diagnosis not present

## 2023-07-20 ENCOUNTER — Ambulatory Visit (HOSPITAL_COMMUNITY)
Admission: RE | Admit: 2023-07-20 | Discharge: 2023-07-20 | Disposition: A | Discharge status: Home / Self Care | Payer: PPO | Source: Ambulatory Visit | Attending: Orthopedic Surgery | Admitting: Orthopedic Surgery

## 2023-07-20 ENCOUNTER — Encounter: Payer: Self-pay | Admitting: Physical Therapy

## 2023-07-20 ENCOUNTER — Ambulatory Visit: Payer: PPO | Admitting: Orthopedic Surgery

## 2023-07-20 ENCOUNTER — Ambulatory Visit (INDEPENDENT_AMBULATORY_CARE_PROVIDER_SITE_OTHER): Payer: PPO | Admitting: Physical Therapy

## 2023-07-20 DIAGNOSIS — Z89511 Acquired absence of right leg below knee: Secondary | ICD-10-CM

## 2023-07-20 DIAGNOSIS — G546 Phantom limb syndrome with pain: Secondary | ICD-10-CM

## 2023-07-20 DIAGNOSIS — M1712 Unilateral primary osteoarthritis, left knee: Secondary | ICD-10-CM | POA: Diagnosis not present

## 2023-07-20 DIAGNOSIS — M6281 Muscle weakness (generalized): Secondary | ICD-10-CM

## 2023-07-20 DIAGNOSIS — Z7409 Other reduced mobility: Secondary | ICD-10-CM

## 2023-07-20 DIAGNOSIS — M79605 Pain in left leg: Secondary | ICD-10-CM | POA: Insufficient documentation

## 2023-07-20 DIAGNOSIS — R2681 Unsteadiness on feet: Secondary | ICD-10-CM

## 2023-07-20 DIAGNOSIS — R2689 Other abnormalities of gait and mobility: Secondary | ICD-10-CM | POA: Diagnosis not present

## 2023-07-20 DIAGNOSIS — I87332 Chronic venous hypertension (idiopathic) with ulcer and inflammation of left lower extremity: Secondary | ICD-10-CM | POA: Diagnosis not present

## 2023-07-20 DIAGNOSIS — S88111D Complete traumatic amputation at level between knee and ankle, right lower leg, subsequent encounter: Secondary | ICD-10-CM

## 2023-07-20 DIAGNOSIS — I890 Lymphedema, not elsewhere classified: Secondary | ICD-10-CM | POA: Diagnosis not present

## 2023-07-20 NOTE — Therapy (Addendum)
 OUTPATIENT PHYSICAL THERAPY PROSTHETIC TREATMENT    Patient Name: Susan [name] MRN: [id number] DOB:December 21, 1950, 73 y.o., female Today's Date: 07/20/2023  PCP: Donita Brooks, MD  REFERRING PROVIDER: Nadara Mustard, MD   END OF SESSION:  PT End of Session - 07/20/23 1342     Visit Number 27    Number of Visits 44    Date for PT Re-Evaluation 09/10/23    Authorization Type [employer]    Progress Note Due on Visit 30    PT Start Time 1342    PT Stop Time 1440    PT Time Calculation (min) 58 min    Equipment Utilized During Treatment Gait belt    Activity Tolerance Patient tolerated treatment well;Patient limited by fatigue    Behavior During Therapy WFL for tasks assessed/performed            Past Medical History:  Diagnosis Date   Anemia    hx   Anxiety    Arthritis    "generalized" (03/15/2014)   CAD (coronary artery disease)    MI in 2000 - MI  2007 - treated bare metal stent (no nuclear since then as 9/11)   Carotid artery disease (HCC)    Chronic diastolic heart failure (HCC)    a) ECHO (08/2013) EF 55-60% and RV function nl b) RHC (08/2013) RA 4, RV 30/5/7, PA 25/10 (16), PCWP 7, Fick CO/CI 6.3/2.7, PVR 1.5 WU, PA 61 and 66%   Daily headache    "~ every other day; since I fell in June" (03/15/2014)   Depression    Diabetic retinopathy (HCC)    Dyslipidemia    ESRD (end stage renal disease) (HCC)    Dialysis on Tues Thurs Sat   Exertional shortness of breath    GERD (gastroesophageal reflux disease)    History of blood transfusion    History of kidney stones    HTN (hypertension)    Hypothyroidism    Myocardial infarction (HCC)    Obesity    Osteoarthritis    PAF (paroxysmal atrial fibrillation) (HCC)    Peripheral neuropathy    bilateral feet/hands   PONV (postoperative nausea and vomiting)    RBBB (right bundle branch block)    Old   Stroke (HCC)    mini strokes   Type II diabetes mellitus (HCC)    Type II, [name] left  upper arm. patient has omnipod insulin pump with Novolin R Insulin   Past Surgical History:  Procedure Laterality Date   A/V FISTULAGRAM Left 11/07/2022   Procedure: A/V Fistulagram;  Surgeon: Leonie Douglas, MD;  [facility]: [facility];  Service: Cardiovascular;  Laterality: Left;   ABDOMINAL HYSTERECTOMY  1980's   AMPUTATION Right 02/24/2018   Procedure: RIGHT FOOT GREAT TOE AND 2ND TOE AMPUTATION;  Surgeon: Nadara Mustard, MD;  Location: [facility] OR;  Service: Orthopedics;  Laterality: Right;   AMPUTATION Right 04/30/2018   Procedure: RIGHT TRANSMETATARSAL AMPUTATION;  Surgeon: Nadara Mustard, MD;  Location: [facility] OR;  Service: Orthopedics;  Laterality: Right;   AMPUTATION Right 05/02/2022   Procedure: RIGHT BELOW KNEE AMPUTATION;  Surgeon: Nadara Mustard, MD;  Location: [facility] OR;  Service: Orthopedics;  Laterality: Right;   APPLICATION OF WOUND VAC Right 06/13/2022   Procedure: APPLICATION OF WOUND VAC;  Surgeon: Nadara Mustard, MD;  Location: [facility] OR;  Service: Orthopedics;  Laterality: Right;   APPLICATION OF WOUND VAC Left 11/14/2022   Procedure: APPLICATION OF WOUND VAC;  Surgeon: Nadara Mustard, MD;  Location: [facility] OR;  Service: Orthopedics;  Laterality: Left;   AV FISTULA PLACEMENT Left 04/02/2022   Procedure: LEFT ARM ARTERIOVENOUS (AV) FISTULA CREATION;  Surgeon: Nada Libman, MD;  Location: [facility] OR;  Service: Vascular;  Laterality: Left;  PERIPHERAL NERVE BLOCK   AV FISTULA PLACEMENT Right 04/27/2023   Procedure: RIGHT ARM BRACHIOBASILIC ARTERIOVENOUS (AV) FISTULA CREATION;  Surgeon: Leonie Douglas, MD;  Location: [facility] OR;  Service: Vascular;  Laterality: Right;   BASCILIC VEIN TRANSPOSITION Left 07/31/2022   Procedure: LEFT ARM SECOND STAGE BASILIC VEIN TRANSPOSITION;  Surgeon: Nada Libman, MD;  Location: [facility] OR;  Service: Vascular;  Laterality: Left;   BIOPSY  05/27/2020   Procedure: BIOPSY;  Surgeon: Lanelle Bal, DO;  Location: [facility];  Service: Endoscopy;;   CATARACT  EXTRACTION, BILATERAL Bilateral ?2013   COLONOSCOPY W/ POLYPECTOMY     COLONOSCOPY WITH PROPOFOL N/A 03/13/2019   Procedure: COLONOSCOPY WITH PROPOFOL;  Surgeon: Beverley Fiedler, MD;  Location: [facility] ENDOSCOPY;  Service: Gastroenterology;  Laterality: N/A;   CORONARY ANGIOPLASTY WITH STENT PLACEMENT  1999; 2007   "1 + 1"   ERCP N/A 02/03/2022   Procedure: ENDOSCOPIC RETROGRADE CHOLANGIOPANCREATOGRAPHY (ERCP);  Surgeon: Jeani Hawking, MD;  Location: [facility] ENDOSCOPY;  Service: Gastroenterology;  Laterality: N/A;   ESOPHAGOGASTRODUODENOSCOPY N/A 02/12/2023   Procedure: ESOPHAGOGASTRODUODENOSCOPY (EGD);  Surgeon: Jeani Hawking, MD;  Location: [facility] ENDOSCOPY;  Service: Gastroenterology;  Laterality: N/A;   ESOPHAGOGASTRODUODENOSCOPY (EGD) WITH PROPOFOL N/A 03/13/2019   Procedure: ESOPHAGOGASTRODUODENOSCOPY (EGD) WITH PROPOFOL;  Surgeon: Beverley Fiedler, MD;  Location: [facility] ENDOSCOPY;  Service: Gastroenterology;  Laterality: N/A;   ESOPHAGOGASTRODUODENOSCOPY (EGD) WITH PROPOFOL N/A 05/27/2020   Procedure: ESOPHAGOGASTRODUODENOSCOPY (EGD) WITH PROPOFOL;  Surgeon: Lanelle Bal, DO;  [facility];  Service: Endoscopy;  Laterality: N/A;   ESOPHAGOGASTRODUODENOSCOPY (EGD) WITH PROPOFOL N/A 09/03/2022   Procedure: ESOPHAGOGASTRODUODENOSCOPY (EGD) WITH PROPOFOL;  Surgeon: Jeani Hawking, MD;  Location: [facility] ENDOSCOPY;  Service: Gastroenterology;  Laterality: N/A;   EYE SURGERY Bilateral    lazer   FLEXIBLE SIGMOIDOSCOPY N/A 05/23/2022   Procedure: FLEXIBLE SIGMOIDOSCOPY;  Surgeon: Jeani Hawking, MD;  Location: [facility] ENDOSCOPY;  Service: Gastroenterology;  Laterality: N/A;   FLEXIBLE SIGMOIDOSCOPY N/A 05/24/2022   Procedure: FLEXIBLE SIGMOIDOSCOPY;  Surgeon: Imogene Burn, MD;  Location: [facility] ENDOSCOPY;  Service: Gastroenterology;  Laterality: N/A;   FLEXIBLE SIGMOIDOSCOPY N/A 09/03/2022   Procedure: FLEXIBLE SIGMOIDOSCOPY;  Surgeon: Jeani Hawking, MD;  Location: [facility] ENDOSCOPY;  Service: Gastroenterology;  Laterality:  N/A;   HEMOSTASIS CLIP PLACEMENT  03/13/2019   Procedure: HEMOSTASIS CLIP PLACEMENT;  Surgeon: Beverley Fiedler, MD;  Location: [facility] ENDOSCOPY;  Service: Gastroenterology;;   HEMOSTASIS CLIP PLACEMENT  05/23/2022   Procedure: HEMOSTASIS CLIP PLACEMENT;  Surgeon: Jeani Hawking, MD;  Location: [facility] ENDOSCOPY;  Service: Gastroenterology;;   HEMOSTASIS CONTROL  05/24/2022   Procedure: HEMOSTASIS CONTROL;  Surgeon: Imogene Burn, MD;  Location: [facility] ENDOSCOPY;  Service: Gastroenterology;;   HOT HEMOSTASIS N/A 05/23/2022   Procedure: HOT HEMOSTASIS (ARGON PLASMA COAGULATION/BICAP);  Surgeon: Jeani Hawking, MD;  Location: [facility] ENDOSCOPY;  Service: Gastroenterology;  Laterality: N/A;   I & D EXTREMITY Left 05/05/2022   Procedure: IRRIGATION AND DEBRIDEMENT LEFT ARM AV FISTULA;  Surgeon: Cephus Shelling, MD;  Location: [facility] OR;  Service: Vascular;  Laterality: Left;   I & D EXTREMITY N/A 11/14/2022   Procedure: IRRIGATION AND DEBRIDEMENT OF LOWER EXTREMITY WOUND;  Surgeon: Nadara Mustard, MD;  Location: [facility] OR;  Service: Orthopedics;  Laterality: N/A;   INSERTION OF DIALYSIS CATHETER Right 04/02/2022   Procedure: INSERTION OF TUNNELED DIALYSIS CATHETER;  Surgeon: Nada Libman, MD;  Location: [facility] OR;  Service: Vascular;  Laterality: Right;   KNEE ARTHROSCOPY Left 10/25/2006   POLYPECTOMY  03/13/2019   Procedure: POLYPECTOMY;  Surgeon: Beverley Fiedler, MD;  Location: [facility] ENDOSCOPY;  Service: Gastroenterology;;   REMOVAL OF STONES  02/03/2022   Procedure: REMOVAL OF STONES;  Surgeon: Jeani Hawking, MD;  Location: [facility] ENDOSCOPY;  Service: Gastroenterology;;   REVISON OF ARTERIOVENOUS FISTULA Left 08/20/2022   Procedure: REVISON OF LEFT ARM ARTERIOVENOUS FISTULA;  Surgeon: Nada Libman, MD;  Location: [facility] OR;  Service: Vascular;  Laterality: Left;   RIGHT HEART CATH N/A 07/24/2017   Procedure: RIGHT HEART CATH;  Surgeon: Dolores Patty, MD;  [facility];  Service: Cardiovascular;  Laterality: N/A;    RIGHT HEART CATHETERIZATION N/A 09/22/2013   Procedure: RIGHT HEART CATH;  Surgeon: Dolores Patty, MD;  Location: [facility] CATH LAB;  Service: Cardiovascular;  Laterality: N/A;   SHOULDER ARTHROSCOPY WITH OPEN ROTATOR CUFF REPAIR Right 03/14/2014   Procedure: RIGHT SHOULDER ARTHROSCOPY WITH BICEPS RELEASE, OPEN SUBSCAPULA REPAIR, OPEN SUPRASPINATUS REPAIR.;  Surgeon: Cammy Copa, MD;  Location: [facility] OR;  Service: Orthopedics;  Laterality: Right;   SPHINCTEROTOMY  02/03/2022   Procedure: SPHINCTEROTOMY;  Surgeon: Jeani Hawking, MD;  Location: [facility] ENDOSCOPY;  Service: Gastroenterology;;   STUMP REVISION Right 06/13/2022   Procedure: REVISION RIGHT BELOW KNEE AMPUTATION;  Surgeon: Nadara Mustard, MD;  Location: [facility] OR;  Service: Orthopedics;  Laterality: Right;   TEE WITHOUT CARDIOVERSION N/A 02/04/2022   Procedure: TRANSESOPHAGEAL ECHOCARDIOGRAM (TEE);  Surgeon: Dolores Patty, MD;  Location: [facility] ENDOSCOPY;  Service: Cardiovascular;  Laterality: N/A;   THROMBECTOMY W/ EMBOLECTOMY Left 08/20/2022   Procedure: THROMBECTOMY OF LEFT ARM ARTERIOVENOUS FISTULA;  Surgeon: Nada Libman, MD;  Location: [facility] OR;  Service: Vascular;  Laterality: Left;   TOE AMPUTATION Right 02/24/2018   GREAT TOE AND 2ND TOE AMPUTATION   TUBAL LIGATION  1970's   Patient Active Problem List   Diagnosis Date Noted   Atrial fibrillation (HCC) 06/17/2023   Atopic dermatitis 06/17/2023   Hyperosmolar hyperglycemic state (HHS) (HCC) 02/05/2023   Gastroparesis 02/05/2023   Depression 01/29/2023   Intractable nausea and vomiting 01/20/2023   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) 04/07/2022   Allergy, unspecified,  initial encounter 04/07/2022   Dependence on renal dialysis (HCC) 04/07/2022   Gout due to renal impairment, unspecified site 04/07/2022   Hypertensive heart and chronic kidney disease with heart failure and with stage 5 chronic kidney disease, or end stage renal disease (HCC) 04/07/2022   Personal history of transient ischemic attack (TIA), and cerebral infarction without residual deficits 04/07/2022   Renal osteodystrophy 04/07/2022   Venous stasis ulcer of right calf (HCC) 03/31/2022   Fistula, colovaginal 03/26/2022   Diarrhea 03/26/2022   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   Sepsis without acute organ dysfunction (HCC)    Bacteremia    Acute pancreatitis 02/01/2022   Abdominal pain 02/01/2022   SIRS (systemic inflammatory response syndrome) (HCC) 02/01/2022   Transaminitis 02/01/2022   History of anemia due to chronic kidney disease 02/01/2022   Paroxysmal atrial fibrillation (HCC) 02/01/2022   Uncontrolled type 2 diabetes mellitus with hyperglycemia, with long-term current use of insulin (HCC) 01/14/2022   NSTEMI (non-ST  elevated myocardial infarction) (HCC) 03/05/2021   Acute renal failure superimposed on stage 4 chronic kidney disease (HCC) 08/22/2020   Hypoalbuminemia 05/25/2020   GERD (gastroesophageal reflux disease) 05/25/2020   Pressure injury of skin 05/17/2020   Acute on chronic combined systolic and diastolic congestive heart failure (HCC) 03/07/2020   Type 2 diabetes mellitus with diabetic polyneuropathy, with long-term current use of insulin (HCC) 03/07/2020   Obesity, Class III, BMI 40-49.9 (morbid obesity) (HCC) 03/07/2020   Common bile duct (CBD) obstruction 05/28/2019   Benign neoplasm of ascending colon    Benign neoplasm of transverse colon    Benign neoplasm of descending colon    Benign neoplasm of sigmoid colon    Gastric polyps    Hyperkalemia 03/11/2019   Prolonged QT interval 03/11/2019   Acute blood loss anemia 03/11/2019   Onychomycosis 06/21/2018   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)    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 (HCC)    PVD (peripheral vascular disease) (HCC) 10/26/2017   E-coli UTI 07/27/2017   Hypothyroidism 07/27/2017   AKI (acute kidney injury) (HCC)    PAH (pulmonary artery hypertension) (HCC)    Impaired ambulation 07/19/2017   Leg cramps 02/27/2017   Peripheral edema 01/12/2017   Diabetic neuropathy (HCC) 11/12/2016   CKD (chronic kidney disease), stage IV (HCC) 10/24/2015   Anemia of chronic disease 10/03/2015   Historical diagnosis of generalized anxiety disorder 10/03/2015   Secondary insomnia 10/03/2015   Acute bronchitis 09/05/2015   Hyperglycemia due to diabetes mellitus (HCC) 06/07/2015   Non compliance with medical treatment 04/17/2014   Rotator cuff tear 03/14/2014   Obesity 09/23/2013   Chronic HFrEF (heart failure with reduced ejection fraction) (HCC) 06/03/2013   Hypotension 12/25/2012   Hyponatremia 12/25/2012   Urinary incontinence    MDD (major depressive disorder), recurrent episode, moderate  (HCC) 11/12/2010   RBBB (right bundle branch block)    Wide-complex tachycardia    Coronary artery disease    Hyperlipemia 01/22/2009   Chronic hypotension 01/22/2009    ONSET DATE: 03/09/2023 MD referral to PT  REFERRING DIAG: S88.111D (ICD-10-CM) - Below-knee amputation of right lower extremity, subsequent encounter   THERAPY DIAG:  Unsteadiness on feet  Muscle weakness (generalized)  Right below-knee amputee (HCC)  Impaired functional mobility, balance, gait, and endurance  Other abnormalities of gait and mobility  Phantom limb syndrome with pain (HCC)  Rationale for Evaluation and Treatment: Rehabilitation  SUBJECTIVE:  SUBJECTIVE STATEMENT: States on Friday she has had more  pain and swelling on her LLE specifically in the calf, going behind the leg, and to the posterior thigh area. Concern from therapist for DVT, Dr. Lajoyce Corners notified and came to speak with the patient and her husband, ultimately determining that she needed to go in for a doppler.    PERTINENT HISTORY: Right TTA 05/02/22, Lymphedema, idiopathic chronic venous HTN with ulcer, depression, colitis, diverticulitis, ESRD, gout, NSTEMI, DM2, polyneuropathy, PVD, CAD, right bundle branch block, mini strokes  PAIN:  Are you having pain? No pain today.   PRECAUTIONS: Fall and Other: No BP LUE  WEIGHT BEARING RESTRICTIONS: No  FALLS: Has patient fallen in last 6 months? No  LIVING ENVIRONMENT: Lives with: lives with their spouse and 2 small dogs Lives in: mobile home Home Access: Ramped entrance Home layout: One level Stairs: Yes: External: 6-7 steps; can reach both Has following equipment at home: Single point cane, Environmental consultant - 2 wheeled, Environmental consultant - 4 wheeled, Wheelchair (manual), [employer], Grab bars, and Ramped entry  OCCUPATION:  retired  PLOF: prior to amputation for ~year used RW in home & community  PATIENT GOALS:  to use prosthesis to walk, get out w/c in & out of car, get to bathroom.   OBJECTIVE:   COGNITION: Overall cognitive status:  Eval on 03/23/2023:   Within functional limits for tasks assessed  POSTURE:  Eval on 03/23/2023:  rounded shoulders, forward head, increased thoracic kyphosis, flexed trunk , and weight shift left  LOWER EXTREMITY ROM: ROM Right eval Left eval  Hip flexion    Hip extension    Hip abduction    Hip adduction    Hip internal rotation    Hip external rotation    Knee flexion    Knee extension    Ankle dorsiflexion    Ankle plantarflexion    Ankle inversion    Ankle eversion     (Blank rows = not tested)  LOWER EXTREMITY MMT:  MMT Right eval Left eval  Hip flexion 3-/5 3-/5  Hip extension 2/5 2/5  Hip abduction 2+/5 2+/5  Hip adduction    Hip internal rotation    Hip external rotation    Knee flexion 3-/5 3-/5  Knee extension 3-/5 3-/5  Ankle dorsiflexion    Ankle plantarflexion    Ankle inversion    Ankle eversion    At Evaluation all strength testing is grossly seated and functionally standing / gait. (Blank rows = not tested)  TRANSFERS: 06/15/2023: -Sit to stand transfers with RW wheelchair<>mat table at varied height: 19in with maxA to rise to stand; 22in ModA rise to stand, 24in min-modA rise to stand. -stand pivot transfer with RW w/c to mat table with modA once standing.  -Lateral scooting wc<>table at level height SBA; vc for LE positioning for optimal use of LE during transfer -lateral scoot R: modA due to fatigue and uneven surface transfer from low surface to higher surface   05/04/2023: Squat pivot transfers with modA.  04/21/2023: Pt sit to stand w/c to //bar with one hand pushing on w/c and other hand on //bar with modA first time & MinA 2nd/ 3rd time.  Best is RUE on //bar & LUE on w/c.  Stand to sit reaching LUE to w/c with minA to control descent.   PT demo & verbal cues on sit to/from stand w/c to RW.  1st time modA 2 person. 2nd time maxA one person.  Stand to sit with minA and constant cueing. Scooting  transfers with minA with  w/c & mat direct contact & same ht.   Eval on 03/23/2023:  Sit to stand: Max A (two-person assist) from 22" w/c to rolling walker Stand to sit: Max A (two-person assist) rolling walker to wheelchair  FUNCTIONAL TESTs:   06/15/2023: -Static stance for standing with RW; requiring Mod-MaxA for rise to stand d/t patient unsteady on feet posterior lean without proper righting strategy, SBA for stand   04/21/2023: Pt able to stand 60 sec with BUE on //bars with supervision.  Pt able to stand for 1 min with RW support with minA.  Eval on 03/23/2023: Patient maintained upright holding rolling walker with mod assist 2 people for safety for 30 seconds.  GAIT: 06/17/2023: Patient ambulated 83ft (maximal tolerated distance) with RW ModA (2 person for safety). Required VC for LE sequencing and minA to steer walker and prevent tip over.   04/21/2023: Pt amb 5' in //bars with modA (2 person for safety) with PT verbal & tactile cues on sequence and weight shift.  Pt amb 10' with RW with maxA (2 person for safety) with PT verbal & tactile cues on sequence and weight shift.  Eval on 03/23/2023:  Patient took 2 steps (one per LE) with +2 max assist with rolling walker and TTA prosthesis.  Patient adducting prosthesis stepping on left foot with no awareness.  CURRENT PROSTHETIC WEAR ASSESSMENT: 06/17/2023:  Independence with prosthetic care:  Patient and husband are independent with: skin check, prosthetic cleaning, ply sock cleaning, proper wear schedule/adjustment Patient is dependent with: residual limb care, correct ply sock adjustment, and proper weight-bearing schedule/adjustment Donning prosthesis: husband is independent with proper donning. Patient requires MaxA. Patient can verbally direct someone how to properly assist. Doffing prosthesis: Husband is independent with proper donning. Patient requires modA. Patient can verbally direct someone how to properly  assist. Prosthetic wear tolerance: majority of awake hours including dialysis hours, 1x/day, 3-4 days/week Prosthetic weight bearing tolerance: able to tolerate of standing without complains of prosthetic limb pain. Time not limited by limb discomfort but limited by muscle fatigue.   Eval on 03/23/2023:   Patient is dependent with: skin check, residual limb care, prosthetic cleaning, ply sock cleaning, correct ply sock adjustment, proper wear schedule/adjustment, and proper weight-bearing schedule/adjustment Donning prosthesis: Max A Doffing prosthesis: Min A Prosthetic wear tolerance: 2-6 hours, 1x/day, 3-4 days/week Prosthetic weight bearing tolerance: 3 minutes with limb pain Edema: pitting edema Residual limb condition: No open areas, dry skin, normal color and temperature, cylindrical shape Prosthetic description: Silicone liner with pin lock suspension, total contact socket with flexible inner socket, sach foot    TODAY'S  TREATMENT:                                                                                                                             DATE: 07/20/2023  Self care: Patient and husband educated on DVT risk, signs and symptoms, and urgency for doppler to rule out DVT. Patient  and husband verbalized understanding.   Prosthetic training: Patient able to instruct on process on don/doffing prosthetic leg with set up assist and cuing with patient able to get to correct answer. Required education on how to verbalize line up of socket to ensure proper alignment. Verbal cues and demo used with patient able to verbalize and teach back.    TREATMENT:                                                                                                                             DATE: 07/08/2023  Therapeutic Activities: Patient ambulated 15' with 62* turn to orient to path and 90* to position in front of w/c with RW modA during turns and MinA straight path. walked from outside  of bathroom to toilet to sit to build patient confidence in ability at home in the future. PT cues on sequencing and weight shift especially during turns.  Sit to Stand from wheelchair required modA to prevent posterior LOB. Use of railing to sit and stand from toilet requiring mod-maxA to rise due to low surface without grab bar on L side.  Static standing to work on ability to maintain balance while husband assists at home in future. Patient able to tolerate standing with RW support with supervision for 3 min to allow time for simulation of hygienic cleaning. Required long duration seated rest break on toilet after stand with patient demonstrating seated balance unsupported however with fatigue patient did rest trunk / back on commode.  Patient able to ambulate 52' with RW back out to wheelchair minA/CGA straight path and requiring min-modA for turning to sit in chair.    Self care: Patient and husband educated on use of BSC for over the toilet as it would raise the height and has armrests for patient to use for ease of sit/stand transfer. Also educated on using stool under feet while seated on toilet to aid with her ability for BM.  Patient and husband instructed to figure out height of toilet at home and [facility] for safe practice in clinic.     TREATMENT:                                                                                                                             DATE: 07/06/2023  Vitals: Baseline 97% SpO2, 87 HR SpO2 98% 80 HR after 2 bouts of stand with feet walk in SpO2  100% 78 HR after bouts of standing balance Therapeutic Activities: Sit to stand w/c to RW with LE walk in x5, ModA to begin progressing to 2 bouts of minA and 1 bout of CGA;  Stand with 1UE tap / reaching forward to therapist hand (shoulder flexion within her arm length)  x4 (2 ea); CGA with RW support.   Neuromuscular Re-education: Standing balance with RW support - looking right/left x5, up/down x5, diagonal x5  ~90min total of standing; modA to MaxA with patient losing balance posteriorly when looking up.  Seated rest then Standing with RW support - scanning up/down x5 with diagonal L looking x5; MinA to CGA as patient progressed. Static stance with RW with eyes closed 10 sec 3 reps with CGA.  Car transfer Attempted however after assessment of patients car, the seat is too high, even with the car kneeling, for the patient to safely enter car via RW.    PATIENT EDUCATION: PATIENT EDUCATED ON FOLLOWING PROSTHETIC CARE: Education details: Use of stockinette under proximal liner to decrease itching sensation, no wounds presents to PT recommended not using Vive wear under liner Skin check, Prosthetic cleaning, Propper donning, and Proper wear schedule/adjustment Prosthetic wear tolerance: 3 hours 2x/day, 4 days/week Person educated: Patient and Spouse Education method: Explanation, Tactile cues, and Verbal cues Education comprehension: verbalized understanding, verbal cues required, tactile cues required, and needs further education  HOME EXERCISE PROGRAM: Access Code: [email or web address] URL: [email or web address] Date: 04/15/2023 Prepared by: Vladimir Faster  Exercises - Supine Bridge  - 1 x daily - 4 x weekly - 2 sets - 5 reps - 2 seconds hold - Supine Lower Trunk Rotation  - 1 x daily - 4 x weekly - 2 sets - 5 reps - 5 seconds hold - Supine Heel Slide with Strap  - 1 x daily - 4 x weekly - 2 sets - 5 reps - 2-3 seconds hold - Supine Hip Abduction  - 1 x daily - 4 x weekly - 2 sets - 5 reps - 2-3 seconds hold - Seated Hip Flexion Toward Target  - 1 x daily - 4 x weekly - 2 sets - 5 reps - 5 seconds hold - Seated Eccentric Abdominal Lean Back  - 1 x daily - 4 x weekly - 2 sets - 5 reps - 5 seconds hold - Seated Sidebending  - 1 x daily - 4 x weekly - 2 sets - 5 reps - 5 seconds hold - Seated Trunk Rotation with Crossed Arms  - 1 x daily - 4 x weekly - 2 sets - 5 reps - 5 seconds  hold   ASSESSMENT:  CLINICAL IMPRESSION:  Due to concern for DVT, activities requiting movement were not done however patient was educated on DVT risk factors and was able 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 Will require more practice for patient to become more efficient at instruction of don/doffing.    OBJECTIVE IMPAIRMENTS: Abnormal gait, decreased activity tolerance, decreased balance, decreased endurance, decreased knowledge of condition, decreased knowledge of use of DME, decreased mobility, difficulty walking, decreased ROM, decreased strength, decreased safety awareness, postural dysfunction, prosthetic dependency , and pain.   ACTIVITY LIMITATIONS: standing, transfers, and locomotion level  PARTICIPATION LIMITATIONS: community activity, household mobility and dependency / burden of care on family  PERSONAL FACTORS: Age, Fitness, Past/current experiences, Time since onset of injury/illness/exacerbation, and 3+ comorbidities: see PMH  are also affecting patient's functional outcome.   REHAB POTENTIAL: Good  CLINICAL DECISION MAKING: Evolving/moderate complexity  EVALUATION COMPLEXITY: Moderate   GOALS: Goals reviewed with patient? Yes  SHORT TERM GOALS: Target date: 07/15/2023:  Patient & husband verbalize proper residual limb care and understanding for adjusting ply socks.  Baseline: SEE OBJECTIVE DATA Goal status:  ongoing 07/06/2023 2.  Patient able to sit to stand w/c to rolling walker with 1 person modA and maintain upright with RW for 4 min with SBA to assist with toileting needs/hygienic cleaning.  Baseline: SEE OBJECTIVE DATA Goal status: partially met 07/08/2023  3. Patient ambulates 85ft including turning 90* to position to sit with RW & prosthesis with modA to navigate reaching bathroom in home.  Baseline: SEE OBJECTIVE DATA Goal status: partially met 07/08/2023   LONG TERM GOALS: Target date: 09/10/2023  Patient & husband demonstrates & verbalizes  understanding of prosthetic care to enable safe utilization of prosthesis. Baseline: SEE OBJECTIVE DATA Goal status: ongoing 07/06/2023  Patient tolerates prosthesis wear >80% of awake hours on non-dialysis days and >50% on dialysis days without skin issues and limb pain </= 4/10. Baseline: SEE OBJECTIVE DATA Goal status: MET 06/15/2023  Scooting transfers with minA and sit to/from stand transfers with modA.  Baseline: SEE OBJECTIVE DATA - scooting met but sit to stand MaxA Goal status: ongoing 07/06/2023  Patient ambulates >88ft including turning 90* to position to sit with prosthesis & RW with modA for household navigation.  Baseline: SEE OBJECTIVE DATA Goal status: ongoing 07/06/2023  Patient able to maintain static stance with RW support with SBA for 5 min to assist with hygienic cleaning.  Baseline: SEE OBJECTIVE DATA Goal status:  ongoing 07/06/2023   PLAN:  PT FREQUENCY: 2x/week  PT DURATION: 12 weeks  PLANNED INTERVENTIONS: 97164- PT Re-evaluation, 97110-Therapeutic exercises, 97530- Therapeutic activity, 97112- Neuromuscular re-education, [phone]- Self Care, 61607- Gait training, [phone]- Prosthetic training, Patient/Family education, Balance training, DME instructions, Therapeutic exercises, Therapeutic activity, Neuromuscular re-education, Gait training, and Self Care  PLAN FOR NEXT SESSION:  check doppler report for DVT,  begin bathroom navigation beginning husband assisting under PT guidance, revisit pt being able to instruct on how to don/doff prosthetic leg, continue standing balance for hygienic cleaning (hold RW with slight perturbations applied)   Arnetia Bronk, Lemmie Steinhaus, Student-PT 07/20/2023, 3:40 PM  This entire session of physical therapy was performed under the direct supervision of PT signing evaluation /treatment. PT reviewed note and agrees.   Vladimir Faster, PT, DPT 07/20/2023, 3:58 PM

## 2023-07-20 NOTE — Progress Notes (Signed)
 Lower extremity venous duplex completed. Please see CV Procedures for preliminary results.  Shona Simpson, RVT 07/20/23 4:07 PM

## 2023-07-21 ENCOUNTER — Encounter: Payer: Self-pay | Admitting: Orthopedic Surgery

## 2023-07-21 DIAGNOSIS — D509 Iron deficiency anemia, unspecified: Secondary | ICD-10-CM | POA: Diagnosis not present

## 2023-07-21 DIAGNOSIS — N2581 Secondary hyperparathyroidism of renal origin: Secondary | ICD-10-CM | POA: Diagnosis not present

## 2023-07-21 DIAGNOSIS — Z992 Dependence on renal dialysis: Secondary | ICD-10-CM | POA: Diagnosis not present

## 2023-07-21 DIAGNOSIS — D689 Coagulation defect, unspecified: Secondary | ICD-10-CM | POA: Diagnosis not present

## 2023-07-21 DIAGNOSIS — N186 End stage renal disease: Secondary | ICD-10-CM | POA: Diagnosis not present

## 2023-07-21 NOTE — Progress Notes (Signed)
 Office Visit Note   Patient: Susan [name]           Date of Birth: 1950/06/17           MRN: [id number] Visit Date: 07/20/2023              Requested by: Donita Brooks, MD [address] [place],  [place] [address] PCP: Donita Brooks, MD  Chief Complaint  Patient presents with   Right Leg - Follow-up    Hx Right BKA      HPI: Patient is a 73 year old woman who is seen for 3 separate issues.  #1 she is status post right transtibial amputation.  #2 she states she has weakness in her left leg with pain radiating along the greater saphenous vein.  Patient complains of arthritic pain and weakness in the left knee.  Assessment & Plan: Visit Diagnoses:  1. Pain in left leg   2. Below-knee amputation of right lower extremity, subsequent encounter (HCC)   3. Lymphedema   4. 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 Unilateral primary osteoarthritis, left knee     Plan: Prescription was provided for [employer] for a new socket on the right.  Patient was sent for an ultrasound which was negative for DVT in the left leg.  Will obtain 2 view radiographs of the left knee at follow-up.  Follow-Up Instructions: No follow-ups on file.   Ortho Exam  Patient is alert, oriented, no adenopathy, well-dressed, normal affect, normal respiratory effort. Examination patient has tenderness to palpation along the medial left lower extremity.  She has limited range of motion of the left knee.  Radiographs about 6 years ago show tricompartmental arthritis of the left knee.  Examination of the right transtibial amputation patient does not have rotational stability with standing and walking.  She is an bearing in the socket despite modifications to the socket.  There are no open ulcers.  Patient is an existing right transtibial  amputee.  Patient's current comorbidities are not expected to impact the ability to function with the prescribed  prosthesis. Patient verbally communicates a strong desire to use a prosthesis. Patient currently requires mobility aids to ambulate without a prosthesis.  Expects not to use mobility aids with a new prosthesis.  Patient is a K2 level ambulator that will use a prosthesis to walk around their home and the community over low level environmental barriers.      Imaging: VAS [name] LOWER EXTREMITY VENOUS (DVT) Result Date: 07/20/2023  Lower Venous DVT Study Patient Name:  HUSNA KRONE  Date of Exam:   07/20/2023 Medical Rec #: [id number]       Accession #:    [id number] Date of Birth: 02-12-1951       Patient Gender: F Patient Age:   2 years Exam Location:  [facility] Procedure:      VAS [name] LOWER EXTREMITY VENOUS (DVT) Referring Phys: Elexis Pollak --------------------------------------------------------------------------------  Indications: Pain.  Risk Factors: Past pregnancy. Comparison Study: None. Performing Technologist: Shona Simpson  Examination Guidelines: A complete evaluation includes B-mode imaging, spectral Doppler, color Doppler, and power Doppler as needed of all accessible portions of each vessel. Bilateral testing is considered an integral part of a complete examination. Limited examinations for reoccurring indications may be performed as noted. The reflux portion of the exam is performed with the patient in reverse Trendelenburg.  +-----+---------------+---------+-----------+----------+--------------+ RIGHTCompressibilityPhasicitySpontaneityPropertiesThrombus Aging +-----+---------------+---------+-----------+----------+--------------+ CFV  Full  Yes      Yes                                 +-----+---------------+---------+-----------+----------+--------------+   +---------+---------------+---------+-----------+----------+--------------+ LEFT     CompressibilityPhasicitySpontaneityPropertiesThrombus Aging  +---------+---------------+---------+-----------+----------+--------------+ CFV      Full           Yes      Yes                                 +---------+---------------+---------+-----------+----------+--------------+ SFJ      Full                                                        +---------+---------------+---------+-----------+----------+--------------+ FV Prox  Full                                                        +---------+---------------+---------+-----------+----------+--------------+ FV Mid   Full                                                        +---------+---------------+---------+-----------+----------+--------------+ FV DistalFull                                                        +---------+---------------+---------+-----------+----------+--------------+ PFV      Full                                                        +---------+---------------+---------+-----------+----------+--------------+ POP      Full           Yes      Yes                                 +---------+---------------+---------+-----------+----------+--------------+ PTV      Full                                                        +---------+---------------+---------+-----------+----------+--------------+ PERO     Full                                                        +---------+---------------+---------+-----------+----------+--------------+ Avascular mass seen behind  left knee measuring 3.48 x 2.12 x 6.50 cm.    Summary: RIGHT: - No evidence of common femoral vein obstruction.   LEFT: - There is no evidence of deep vein thrombosis in the lower extremity.  - A cystic structure is found in the popliteal fossa.  *See table(s) above for measurements and observations.    Preliminary    No images are attached to the encounter.  Labs: Lab Results  Component Value Date   HGBA1C 9.0 (H) 02/27/2023   HGBA1C 7.9 (H) 08/27/2022   HGBA1C  8.9 (H) 05/02/2022   ESRSEDRATE 17 11/10/2022   CRP 3.6 (H) 11/10/2022   CRP 0.6 09/01/2022   CRP 0.7 08/22/2020   LABURIC 4.7 08/09/2020   LABURIC 9.1 (H) 10/12/2018   LABURIC 9.0 (H) 03/06/2018   REPTSTATUS 11/19/2022 FINAL 11/14/2022   GRAMSTAIN  11/14/2022    MODERATE WBC PRESENT,BOTH PMN AND MONONUCLEAR RARE SQUAMOUS EPITHELIAL CELLS PRESENT NO ORGANISMS SEEN Performed at [facility], [address], [place], [place] [address]    CULT  11/14/2022    FEW PROTEUS MIRABILIS RARE KLEBSIELLA PNEUMONIAE NO ANAEROBES ISOLATED FEW ENTEROCOCCUS FAECALIS    LABORGA PROTEUS MIRABILIS 11/14/2022   LABORGA KLEBSIELLA PNEUMONIAE 11/14/2022   LABORGA ENTEROCOCCUS FAECALIS 11/14/2022     Lab Results  Component Value Date   ALBUMIN 2.6 (L) 02/17/2023   ALBUMIN 2.8 (L) 02/15/2023   ALBUMIN 2.9 (L) 02/14/2023   PREALBUMIN 12 (L) 11/10/2022   PREALBUMIN 13 (L) 05/02/2022    Lab Results  Component Value Date   MG 1.8 02/27/2023   MG 1.6 (L) 02/15/2023   MG 1.9 02/14/2023   Lab Results  Component Value Date   VD25OH 42.14 05/02/2022    Lab Results  Component Value Date   PREALBUMIN 12 (L) 11/10/2022   PREALBUMIN 13 (L) 05/02/2022      Latest Ref Rng & Units 04/27/2023    7:08 AM 02/17/2023    9:17 AM 02/15/2023   10:43 AM  CBC EXTENDED  WBC 4.0 - 10.5 K/uL  5.2  6.7   RBC 3.87 - 5.11 MIL/uL  4.12  4.58   Hemoglobin 12.0 - 15.0 g/dL 29.5  62.1  30.8   HCT 36.0 - 46.0 % 31.0  33.4  35.7   Platelets 150 - 400 K/uL  186  196      There is no height or weight on file to calculate BMI.  Orders:  Orders Placed This Encounter  Procedures   VAS [name] LOWER EXTREMITY VENOUS (DVT)   No orders of the defined types were placed in this encounter.    Procedures: No procedures performed  Clinical Data: No additional findings.  ROS:  All other systems negative, except as noted in the HPI. Review of Systems  Objective: Vital Signs: There were no vitals taken for  this visit.  Specialty Comments:  No specialty comments available.  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) 04/07/2022   Allergy, unspecified, initial encounter 04/07/2022   Dependence on renal dialysis (HCC) 04/07/2022   Gout due to renal impairment, unspecified site 04/07/2022   Hypertensive heart and chronic kidney disease with heart failure and with stage 5 chronic kidney disease, or end stage renal disease (HCC) 04/07/2022   Personal  history of transient ischemic attack (TIA), and cerebral infarction without residual deficits 04/07/2022   Renal osteodystrophy 04/07/2022   Venous stasis ulcer of right calf (HCC) 03/31/2022   Fistula, colovaginal 03/26/2022   Diarrhea 03/26/2022   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   Sepsis without acute organ dysfunction (HCC)    Bacteremia    Acute pancreatitis 02/01/2022   Abdominal pain 02/01/2022   SIRS (systemic inflammatory response syndrome) (HCC) 02/01/2022   Transaminitis 02/01/2022   History of anemia due to chronic kidney disease 02/01/2022   Paroxysmal atrial fibrillation (HCC) 02/01/2022   Uncontrolled type 2 diabetes mellitus with hyperglycemia, with long-term current use of insulin (HCC) 01/14/2022   NSTEMI (non-ST elevated myocardial infarction) (HCC) 03/05/2021   Acute renal failure superimposed on stage 4 chronic kidney disease (HCC) 08/22/2020   Hypoalbuminemia 05/25/2020   GERD (gastroesophageal reflux disease) 05/25/2020   Pressure injury of skin 05/17/2020   Acute on chronic combined systolic and diastolic congestive heart failure (HCC) 03/07/2020   Type 2 diabetes mellitus with diabetic polyneuropathy, with long-term current use of insulin (HCC) 03/07/2020   Obesity, Class III, BMI 40-49.9 (morbid obesity) (HCC) 03/07/2020   Common bile duct (CBD) obstruction 05/28/2019   Benign neoplasm of ascending colon    Benign neoplasm of transverse colon    Benign neoplasm of descending colon    Benign neoplasm of sigmoid colon    Gastric polyps    Hyperkalemia 03/11/2019   Prolonged QT interval 03/11/2019   Acute blood loss anemia 03/11/2019   Onychomycosis 06/21/2018   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)    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 (HCC)    PVD (peripheral vascular disease) (HCC) 10/26/2017   E-coli UTI 07/27/2017   Hypothyroidism 07/27/2017   AKI (acute kidney injury) (HCC)    PAH (pulmonary artery hypertension)  (HCC)    Impaired ambulation 07/19/2017   Leg cramps 02/27/2017   Peripheral edema 01/12/2017   Diabetic neuropathy (HCC) 11/12/2016   CKD (chronic kidney disease), stage IV (HCC) 10/24/2015   Anemia of chronic disease 10/03/2015   Historical diagnosis of generalized anxiety disorder 10/03/2015   Secondary insomnia 10/03/2015   Acute bronchitis 09/05/2015   Hyperglycemia due to diabetes mellitus (HCC) 06/07/2015   Non compliance with medical treatment 04/17/2014   Rotator cuff tear 03/14/2014   Obesity 09/23/2013   Chronic HFrEF (heart failure with  reduced ejection fraction) (HCC) 06/03/2013   Hypotension 12/25/2012   Hyponatremia 12/25/2012   Urinary incontinence    MDD (major depressive disorder), recurrent episode, moderate (HCC) 11/12/2010   RBBB (right bundle branch block)    Wide-complex tachycardia    Coronary artery disease    Hyperlipemia 01/22/2009   Chronic hypotension 01/22/2009   Past Medical History:  Diagnosis Date   Anemia    hx   Anxiety    Arthritis    "generalized" (03/15/2014)   CAD (coronary artery disease)    MI in 2000 - MI  2007 - treated bare metal stent (no nuclear since then as 9/11)   Carotid artery disease (HCC)    Chronic diastolic heart failure (HCC)    a) ECHO (08/2013) EF 55-60% and RV function nl b) RHC (08/2013) RA 4, RV 30/5/7, PA 25/10 (16), PCWP 7, Fick CO/CI 6.3/2.7, PVR 1.5 WU, PA 61 and 66%   Daily headache    "~ every other day; since I fell in June" (03/15/2014)   Depression    Diabetic retinopathy (HCC)    Dyslipidemia    ESRD (end stage renal disease) (HCC)    Dialysis on Tues Thurs Sat   Exertional shortness of breath    GERD (gastroesophageal reflux disease)    History of blood transfusion    History of kidney stones    HTN (hypertension)    Hypothyroidism    Myocardial infarction (HCC)    Obesity    Osteoarthritis    PAF (paroxysmal atrial fibrillation) (HCC)    Peripheral neuropathy    bilateral feet/hands   PONV  (postoperative nausea and vomiting)    RBBB (right bundle branch block)    Old   Stroke (HCC)    mini strokes   Type II diabetes mellitus (HCC)    Type II, [name]. patient has omnipod insulin pump with Novolin R Insulin    Family History  Problem Relation Age of Onset   Heart attack Mother 16    Past Surgical History:  Procedure Laterality Date   A/V FISTULAGRAM Left 11/07/2022   Procedure: A/V Fistulagram;  Surgeon: Leonie Douglas, MD;  Location: [facility];  Service: Cardiovascular;  Laterality: Left;   ABDOMINAL HYSTERECTOMY  1980's   AMPUTATION Right 02/24/2018   Procedure: RIGHT FOOT GREAT TOE AND 2ND TOE AMPUTATION;  Surgeon: Nadara Mustard, MD;  Location: [facility] OR;  Service: Orthopedics;  Laterality: Right;   AMPUTATION Right 04/30/2018   Procedure: RIGHT TRANSMETATARSAL AMPUTATION;  Surgeon: Nadara Mustard, MD;  Location: [facility] OR;  Service: Orthopedics;  Laterality: Right;   AMPUTATION Right 05/02/2022   Procedure: RIGHT BELOW KNEE AMPUTATION;  Surgeon: Nadara Mustard, MD;  Location: [facility] OR;  Service: Orthopedics;  Laterality: Right;   APPLICATION OF WOUND VAC Right 06/13/2022   Procedure: APPLICATION OF WOUND VAC;  Surgeon: Nadara Mustard, MD;  Location: [facility] OR;  Service: Orthopedics;  Laterality: Right;   APPLICATION OF WOUND VAC Left 11/14/2022   Procedure: APPLICATION OF WOUND VAC;  Surgeon: Nadara Mustard, MD;  Location: [facility] OR;  Service: Orthopedics;  Laterality: Left;   AV FISTULA PLACEMENT Left 04/02/2022   Procedure: LEFT ARM ARTERIOVENOUS (AV) FISTULA CREATION;  Surgeon: Nada Libman, MD;  Location: [facility] OR;  Service: Vascular;  Laterality: Left;  PERIPHERAL NERVE BLOCK   AV FISTULA PLACEMENT Right 04/27/2023   Procedure: RIGHT ARM BRACHIOBASILIC ARTERIOVENOUS (AV) FISTULA CREATION;  Surgeon: Leonie Douglas, MD;  Location: [facility] OR;  Service: Vascular;  Laterality: Right;   BASCILIC VEIN TRANSPOSITION Left 07/31/2022   Procedure: LEFT ARM SECOND STAGE  BASILIC VEIN TRANSPOSITION;  Surgeon: Nada Libman, MD;  Location: [facility] OR;  Service: Vascular;  Laterality: Left;   BIOPSY  05/27/2020   Procedure: BIOPSY;  Surgeon: Lanelle Bal, DO;  Location: [facility];  Service: Endoscopy;;   CATARACT EXTRACTION, BILATERAL Bilateral ?2013   COLONOSCOPY W/ POLYPECTOMY     COLONOSCOPY WITH PROPOFOL N/A 03/13/2019   Procedure: COLONOSCOPY WITH PROPOFOL;  Surgeon: Beverley Fiedler, MD;  Location: [facility] ENDOSCOPY;  Service: Gastroenterology;  Laterality: N/A;   CORONARY ANGIOPLASTY WITH STENT PLACEMENT  1999; 2007   "1 + 1"   ERCP N/A 02/03/2022   Procedure: ENDOSCOPIC RETROGRADE CHOLANGIOPANCREATOGRAPHY (ERCP);  Surgeon: Jeani Hawking, MD;  Location: [facility] ENDOSCOPY;  Service: Gastroenterology;  Laterality: N/A;   ESOPHAGOGASTRODUODENOSCOPY N/A 02/12/2023   Procedure: ESOPHAGOGASTRODUODENOSCOPY (EGD);  Surgeon: Jeani Hawking, MD;  Location: [facility] ENDOSCOPY;  Service: Gastroenterology;  Laterality: N/A;   ESOPHAGOGASTRODUODENOSCOPY (EGD) WITH PROPOFOL N/A 03/13/2019   Procedure: ESOPHAGOGASTRODUODENOSCOPY (EGD) WITH PROPOFOL;  Surgeon: Beverley Fiedler, MD;  Location: [facility] ENDOSCOPY;  Service: Gastroenterology;  Laterality: N/A;   ESOPHAGOGASTRODUODENOSCOPY (EGD) WITH PROPOFOL N/A 05/27/2020   Procedure: ESOPHAGOGASTRODUODENOSCOPY (EGD) WITH PROPOFOL;  Surgeon: Lanelle Bal, DO;  [facility];  Service: Endoscopy;  Laterality: N/A;   ESOPHAGOGASTRODUODENOSCOPY (EGD) WITH PROPOFOL N/A 09/03/2022   Procedure: ESOPHAGOGASTRODUODENOSCOPY (EGD) WITH PROPOFOL;  Surgeon: Jeani Hawking, MD;  Location: [facility] ENDOSCOPY;  Service: Gastroenterology;  Laterality: N/A;   EYE SURGERY Bilateral    lazer   FLEXIBLE SIGMOIDOSCOPY N/A 05/23/2022   Procedure: FLEXIBLE SIGMOIDOSCOPY;  Surgeon: Jeani Hawking, MD;  Location: [facility] ENDOSCOPY;  Service: Gastroenterology;  Laterality: N/A;   FLEXIBLE SIGMOIDOSCOPY N/A 05/24/2022   Procedure: FLEXIBLE SIGMOIDOSCOPY;  Surgeon: Imogene Burn, MD;  Location: [facility] ENDOSCOPY;  Service: Gastroenterology;  Laterality: N/A;   FLEXIBLE SIGMOIDOSCOPY N/A 09/03/2022   Procedure: FLEXIBLE SIGMOIDOSCOPY;  Surgeon: Jeani Hawking, MD;  Location: [facility] ENDOSCOPY;  Service: Gastroenterology;  Laterality: N/A;   HEMOSTASIS CLIP PLACEMENT  03/13/2019   Procedure: HEMOSTASIS CLIP PLACEMENT;  Surgeon: Beverley Fiedler, MD;  Location: [facility] ENDOSCOPY;  Service: Gastroenterology;;   HEMOSTASIS CLIP PLACEMENT  05/23/2022   Procedure: HEMOSTASIS CLIP PLACEMENT;  Surgeon: Jeani Hawking, MD;  Location: [facility] ENDOSCOPY;  Service: Gastroenterology;;   HEMOSTASIS CONTROL  05/24/2022   Procedure: HEMOSTASIS CONTROL;  Surgeon: Imogene Burn, MD;  Location: [facility] ENDOSCOPY;  Service: Gastroenterology;;   HOT HEMOSTASIS N/A 05/23/2022   Procedure: HOT HEMOSTASIS (ARGON PLASMA COAGULATION/BICAP);  Surgeon: Jeani Hawking, MD;  Location: [facility] ENDOSCOPY;  Service: Gastroenterology;  Laterality: N/A;   I & D EXTREMITY Left 05/05/2022   Procedure: IRRIGATION AND DEBRIDEMENT LEFT ARM AV FISTULA;  Surgeon: Cephus Shelling, MD;  Location: [facility] OR;  Service: Vascular;  Laterality: Left;   I & D EXTREMITY N/A 11/14/2022   Procedure: IRRIGATION AND DEBRIDEMENT OF LOWER EXTREMITY WOUND;  Surgeon: Nadara Mustard, MD;  Location: [facility] OR;  Service: Orthopedics;  Laterality: N/A;   INSERTION OF DIALYSIS CATHETER Right 04/02/2022   Procedure: INSERTION OF TUNNELED DIALYSIS CATHETER;  Surgeon: Nada Libman, MD;  Location: [facility] OR;  Service: Vascular;  Laterality: Right;   KNEE ARTHROSCOPY Left 10/25/2006   POLYPECTOMY  03/13/2019   Procedure: POLYPECTOMY;  Surgeon: Beverley Fiedler, MD;  Location: [facility] ENDOSCOPY;  Service: Gastroenterology;;   REMOVAL OF STONES  02/03/2022   Procedure:  REMOVAL OF STONES;  Surgeon: Jeani Hawking, MD;  Location: [facility] ENDOSCOPY;  Service: Gastroenterology;;   REVISON OF ARTERIOVENOUS FISTULA Left 08/20/2022   Procedure: REVISON OF LEFT ARM ARTERIOVENOUS FISTULA;  Surgeon:  Nada Libman, MD;  Location: [facility] OR;  Service: Vascular;  Laterality: Left;   RIGHT HEART CATH N/A 07/24/2017   Procedure: RIGHT HEART CATH;  Surgeon: Dolores Patty, MD;  [facility];  Service: Cardiovascular;  Laterality: N/A;   RIGHT HEART CATHETERIZATION N/A 09/22/2013   Procedure: RIGHT HEART CATH;  Surgeon: Dolores Patty, MD;  Location: [facility] CATH LAB;  Service: Cardiovascular;  Laterality: N/A;   SHOULDER ARTHROSCOPY WITH OPEN ROTATOR CUFF REPAIR Right 03/14/2014   Procedure: RIGHT SHOULDER ARTHROSCOPY WITH BICEPS RELEASE, OPEN SUBSCAPULA REPAIR, OPEN SUPRASPINATUS REPAIR.;  Surgeon: Cammy Copa, MD;  Location: [facility] OR;  Service: Orthopedics;  Laterality: Right;   SPHINCTEROTOMY  02/03/2022   Procedure: SPHINCTEROTOMY;  Surgeon: Jeani Hawking, MD;  Location: [facility] ENDOSCOPY;  Service: Gastroenterology;;   STUMP REVISION Right 06/13/2022   Procedure: REVISION RIGHT BELOW KNEE AMPUTATION;  Surgeon: Nadara Mustard, MD;  Location: [facility] OR;  Service: Orthopedics;  Laterality: Right;   TEE WITHOUT CARDIOVERSION N/A 02/04/2022   Procedure: TRANSESOPHAGEAL ECHOCARDIOGRAM (TEE);  Surgeon: Dolores Patty, MD;  Location: [facility] ENDOSCOPY;  Service: Cardiovascular;  Laterality: N/A;   THROMBECTOMY W/ EMBOLECTOMY Left 08/20/2022   Procedure: THROMBECTOMY OF LEFT ARM ARTERIOVENOUS FISTULA;  Surgeon: Nada Libman, MD;  Location: [facility] OR;  Service: Vascular;  Laterality: Left;   TOE AMPUTATION Right 02/24/2018   GREAT TOE AND 2ND TOE AMPUTATION   TUBAL LIGATION  1970's   Social History   Occupational History    Employer: UNEMPLOYED  Tobacco Use   Smoking status: Former    Current packs/day: 0.00    Average packs/day: 3.0 packs/day for 32.0 years (96.0 ttl pk-yrs)    Types: Cigarettes    Start date: 10/24/1965    Quit date: 10/24/1997    Years since quitting: 25.7   Smokeless tobacco: Never  Vaping Use   Vaping status: Never Used  Substance and Sexual Activity   Alcohol  use: Not Currently   Drug use: No   Sexual activity: Not Currently    Birth control/protection: Surgical    Comment: Hysterectomy

## 2023-07-22 ENCOUNTER — Ambulatory Visit: Payer: PPO | Admitting: Physical Therapy

## 2023-07-22 ENCOUNTER — Encounter: Payer: Self-pay | Admitting: Physical Therapy

## 2023-07-22 ENCOUNTER — Telehealth: Payer: Self-pay | Admitting: Orthopedic Surgery

## 2023-07-22 DIAGNOSIS — R2689 Other abnormalities of gait and mobility: Secondary | ICD-10-CM | POA: Diagnosis not present

## 2023-07-22 DIAGNOSIS — R2681 Unsteadiness on feet: Secondary | ICD-10-CM | POA: Diagnosis not present

## 2023-07-22 DIAGNOSIS — Z7409 Other reduced mobility: Secondary | ICD-10-CM

## 2023-07-22 DIAGNOSIS — M6281 Muscle weakness (generalized): Secondary | ICD-10-CM

## 2023-07-22 NOTE — Telephone Encounter (Signed)
 Patient's husband Ramon Dredge called asked if he can get a call back with results concerning the ultrasound  patient had for possible DVT?  Ramon Dredge said patient is suppose to have (PT) today. The number to contact Ramon Dredge is [phone]

## 2023-07-22 NOTE — Telephone Encounter (Signed)
 I called and sw pt's husband advised result was negative Dr. Lajoyce Corners reviewed . She will come in today for her physical therapy as scheduled.

## 2023-07-22 NOTE — Therapy (Addendum)
 OUTPATIENT PHYSICAL THERAPY PROSTHETIC TREATMENT    Patient Name: Susan [name] MRN: [id number] DOB:07-Dec-1950, 73 y.o., female Today's Date: 07/22/2023  PCP: Donita Brooks, MD  REFERRING PROVIDER: Nadara Mustard, MD   END OF SESSION:  PT End of Session - 07/22/23 1336     Visit Number 28    Number of Visits 44    Date for PT Re-Evaluation 09/10/23    Authorization Type [employer]    Progress Note Due on Visit 30    PT Start Time 1343    PT Stop Time 1431    PT Time Calculation (min) 48 min    Equipment Utilized During Treatment Gait belt    Activity Tolerance Patient tolerated treatment well;Patient limited by fatigue    Behavior During Therapy WFL for tasks assessed/performed             Past Medical History:  Diagnosis Date   Anemia    hx   Anxiety    Arthritis    "generalized" (03/15/2014)   CAD (coronary artery disease)    MI in 2000 - MI  2007 - treated bare metal stent (no nuclear since then as 9/11)   Carotid artery disease (HCC)    Chronic diastolic heart failure (HCC)    a) ECHO (08/2013) EF 55-60% and RV function nl b) RHC (08/2013) RA 4, RV 30/5/7, PA 25/10 (16), PCWP 7, Fick CO/CI 6.3/2.7, PVR 1.5 WU, PA 61 and 66%   Daily headache    "~ every other day; since I fell in June" (03/15/2014)   Depression    Diabetic retinopathy (HCC)    Dyslipidemia    ESRD (end stage renal disease) (HCC)    Dialysis on Tues Thurs Sat   Exertional shortness of breath    GERD (gastroesophageal reflux disease)    History of blood transfusion    History of kidney stones    HTN (hypertension)    Hypothyroidism    Myocardial infarction (HCC)    Obesity    Osteoarthritis    PAF (paroxysmal atrial fibrillation) (HCC)    Peripheral neuropathy    bilateral feet/hands   PONV (postoperative nausea and vomiting)    RBBB (right bundle branch block)    Old   Stroke (HCC)    mini strokes   Type II diabetes mellitus (HCC)    Type II, [name] left  upper arm. patient has omnipod insulin pump with Novolin R Insulin   Past Surgical History:  Procedure Laterality Date   A/V FISTULAGRAM Left 11/07/2022   Procedure: A/V Fistulagram;  Surgeon: Leonie Douglas, MD;  [facility]: [facility];  Service: Cardiovascular;  Laterality: Left;   ABDOMINAL HYSTERECTOMY  1980's   AMPUTATION Right 02/24/2018   Procedure: RIGHT FOOT GREAT TOE AND 2ND TOE AMPUTATION;  Surgeon: Nadara Mustard, MD;  Location: [facility] OR;  Service: Orthopedics;  Laterality: Right;   AMPUTATION Right 04/30/2018   Procedure: RIGHT TRANSMETATARSAL AMPUTATION;  Surgeon: Nadara Mustard, MD;  Location: [facility] OR;  Service: Orthopedics;  Laterality: Right;   AMPUTATION Right 05/02/2022   Procedure: RIGHT BELOW KNEE AMPUTATION;  Surgeon: Nadara Mustard, MD;  Location: [facility] OR;  Service: Orthopedics;  Laterality: Right;   APPLICATION OF WOUND VAC Right 06/13/2022   Procedure: APPLICATION OF WOUND VAC;  Surgeon: Nadara Mustard, MD;  Location: [facility] OR;  Service: Orthopedics;  Laterality: Right;   APPLICATION OF WOUND VAC Left 11/14/2022   Procedure: APPLICATION OF WOUND VAC;  Surgeon: Nadara Mustard, MD;  Location: [facility] OR;  Service: Orthopedics;  Laterality: Left;   AV FISTULA PLACEMENT Left 04/02/2022   Procedure: LEFT ARM ARTERIOVENOUS (AV) FISTULA CREATION;  Surgeon: Nada Libman, MD;  Location: [facility] OR;  Service: Vascular;  Laterality: Left;  PERIPHERAL NERVE BLOCK   AV FISTULA PLACEMENT Right 04/27/2023   Procedure: RIGHT ARM BRACHIOBASILIC ARTERIOVENOUS (AV) FISTULA CREATION;  Surgeon: Leonie Douglas, MD;  Location: [facility] OR;  Service: Vascular;  Laterality: Right;   BASCILIC VEIN TRANSPOSITION Left 07/31/2022   Procedure: LEFT ARM SECOND STAGE BASILIC VEIN TRANSPOSITION;  Surgeon: Nada Libman, MD;  Location: [facility] OR;  Service: Vascular;  Laterality: Left;   BIOPSY  05/27/2020   Procedure: BIOPSY;  Surgeon: Lanelle Bal, DO;  Location: [facility];  Service: Endoscopy;;   CATARACT  EXTRACTION, BILATERAL Bilateral ?2013   COLONOSCOPY W/ POLYPECTOMY     COLONOSCOPY WITH PROPOFOL N/A 03/13/2019   Procedure: COLONOSCOPY WITH PROPOFOL;  Surgeon: Beverley Fiedler, MD;  Location: [facility] ENDOSCOPY;  Service: Gastroenterology;  Laterality: N/A;   CORONARY ANGIOPLASTY WITH STENT PLACEMENT  1999; 2007   "1 + 1"   ERCP N/A 02/03/2022   Procedure: ENDOSCOPIC RETROGRADE CHOLANGIOPANCREATOGRAPHY (ERCP);  Surgeon: Jeani Hawking, MD;  Location: [facility] ENDOSCOPY;  Service: Gastroenterology;  Laterality: N/A;   ESOPHAGOGASTRODUODENOSCOPY N/A 02/12/2023   Procedure: ESOPHAGOGASTRODUODENOSCOPY (EGD);  Surgeon: Jeani Hawking, MD;  Location: [facility] ENDOSCOPY;  Service: Gastroenterology;  Laterality: N/A;   ESOPHAGOGASTRODUODENOSCOPY (EGD) WITH PROPOFOL N/A 03/13/2019   Procedure: ESOPHAGOGASTRODUODENOSCOPY (EGD) WITH PROPOFOL;  Surgeon: Beverley Fiedler, MD;  Location: [facility] ENDOSCOPY;  Service: Gastroenterology;  Laterality: N/A;   ESOPHAGOGASTRODUODENOSCOPY (EGD) WITH PROPOFOL N/A 05/27/2020   Procedure: ESOPHAGOGASTRODUODENOSCOPY (EGD) WITH PROPOFOL;  Surgeon: Lanelle Bal, DO;  [facility];  Service: Endoscopy;  Laterality: N/A;   ESOPHAGOGASTRODUODENOSCOPY (EGD) WITH PROPOFOL N/A 09/03/2022   Procedure: ESOPHAGOGASTRODUODENOSCOPY (EGD) WITH PROPOFOL;  Surgeon: Jeani Hawking, MD;  Location: [facility] ENDOSCOPY;  Service: Gastroenterology;  Laterality: N/A;   EYE SURGERY Bilateral    lazer   FLEXIBLE SIGMOIDOSCOPY N/A 05/23/2022   Procedure: FLEXIBLE SIGMOIDOSCOPY;  Surgeon: Jeani Hawking, MD;  Location: [facility] ENDOSCOPY;  Service: Gastroenterology;  Laterality: N/A;   FLEXIBLE SIGMOIDOSCOPY N/A 05/24/2022   Procedure: FLEXIBLE SIGMOIDOSCOPY;  Surgeon: Imogene Burn, MD;  Location: [facility] ENDOSCOPY;  Service: Gastroenterology;  Laterality: N/A;   FLEXIBLE SIGMOIDOSCOPY N/A 09/03/2022   Procedure: FLEXIBLE SIGMOIDOSCOPY;  Surgeon: Jeani Hawking, MD;  Location: [facility] ENDOSCOPY;  Service: Gastroenterology;  Laterality:  N/A;   HEMOSTASIS CLIP PLACEMENT  03/13/2019   Procedure: HEMOSTASIS CLIP PLACEMENT;  Surgeon: Beverley Fiedler, MD;  Location: [facility] ENDOSCOPY;  Service: Gastroenterology;;   HEMOSTASIS CLIP PLACEMENT  05/23/2022   Procedure: HEMOSTASIS CLIP PLACEMENT;  Surgeon: Jeani Hawking, MD;  Location: [facility] ENDOSCOPY;  Service: Gastroenterology;;   HEMOSTASIS CONTROL  05/24/2022   Procedure: HEMOSTASIS CONTROL;  Surgeon: Imogene Burn, MD;  Location: [facility] ENDOSCOPY;  Service: Gastroenterology;;   HOT HEMOSTASIS N/A 05/23/2022   Procedure: HOT HEMOSTASIS (ARGON PLASMA COAGULATION/BICAP);  Surgeon: Jeani Hawking, MD;  Location: [facility] ENDOSCOPY;  Service: Gastroenterology;  Laterality: N/A;   I & D EXTREMITY Left 05/05/2022   Procedure: IRRIGATION AND DEBRIDEMENT LEFT ARM AV FISTULA;  Surgeon: Cephus Shelling, MD;  Location: [facility] OR;  Service: Vascular;  Laterality: Left;   I & D EXTREMITY N/A 11/14/2022   Procedure: IRRIGATION AND DEBRIDEMENT OF LOWER EXTREMITY WOUND;  Surgeon: Nadara Mustard, MD;  Location: [facility] OR;  Service: Orthopedics;  Laterality: N/A;   INSERTION OF DIALYSIS CATHETER Right 04/02/2022   Procedure: INSERTION OF TUNNELED DIALYSIS CATHETER;  Surgeon: Nada Libman, MD;  Location: [facility] OR;  Service: Vascular;  Laterality: Right;   KNEE ARTHROSCOPY Left 10/25/2006   POLYPECTOMY  03/13/2019   Procedure: POLYPECTOMY;  Surgeon: Beverley Fiedler, MD;  Location: [facility] ENDOS[facility];  Service: Gastroenterology;;   REMOVAL OF STONES  02/03/2022   Procedure: REMOVAL OF STONES;  Surgeon: Jeani Hawking, MD;  Location: [facility] ENDOSCOPY;  Service: Gastroenterology;;   REVISON OF ARTERIOVENOUS FISTULA Left 08/20/2022   Procedure: REVISON OF LEFT ARM ARTERIOVENOUS FISTULA;  Surgeon: Nada Libman, MD;  Location: [facility] OR;  Service: Vascular;  Laterality: Left;   RIGHT HEART CATH N/A 07/24/2017   Procedure: RIGHT HEART CATH;  Surgeon: Dolores Patty, MD;  [facility];  Service: Cardiovascular;  Laterality: N/A;    RIGHT HEART CATHETERIZATION N/A 09/22/2013   Procedure: RIGHT HEART CATH;  Surgeon: Dolores Patty, MD;  Location: [facility] CATH LAB;  Service: Cardiovascular;  Laterality: N/A;   SHOULDER ARTHROSCOPY WITH OPEN ROTATOR CUFF REPAIR Right 03/14/2014   Procedure: RIGHT SHOULDER ARTHROSCOPY WITH BICEPS RELEASE, OPEN SUBSCAPULA REPAIR, OPEN SUPRASPINATUS REPAIR.;  Surgeon: Cammy Copa, MD;  Location: [facility] OR;  Service: Orthopedics;  Laterality: Right;   SPHINCTEROTOMY  02/03/2022   Procedure: SPHINCTEROTOMY;  Surgeon: Jeani Hawking, MD;  Location: [facility] ENDOSCOPY;  Service: Gastroenterology;;   STUMP REVISION Right 06/13/2022   Procedure: REVISION RIGHT BELOW KNEE AMPUTATION;  Surgeon: Nadara Mustard, MD;  Location: [facility] OR;  Service: Orthopedics;  Laterality: Right;   TEE WITHOUT CARDIOVERSION N/A 02/04/2022   Procedure: TRANSESOPHAGEAL ECHOCARDIOGRAM (TEE);  Surgeon: Dolores Patty, MD;  Location: [facility] ENDOSCOPY;  Service: Cardiovascular;  Laterality: N/A;   THROMBECTOMY W/ EMBOLECTOMY Left 08/20/2022   Procedure: THROMBECTOMY OF LEFT ARM ARTERIOVENOUS FISTULA;  Surgeon: Nada Libman, MD;  Location: [facility] OR;  Service: Vascular;  Laterality: Left;   TOE AMPUTATION Right 02/24/2018   GREAT TOE AND 2ND TOE AMPUTATION   TUBAL LIGATION  1970's   Patient Active Problem List   Diagnosis Date Noted   Atrial fibrillation (HCC) 06/17/2023   Atopic dermatitis 06/17/2023   Hyperosmolar hyperglycemic state (HHS) (HCC) 02/05/2023   Gastroparesis 02/05/2023   Depression 01/29/2023   Intractable nausea and vomiting 01/20/2023   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) 04/07/2022   Allergy, unspecified,  initial encounter 04/07/2022   Dependence on renal dialysis (HCC) 04/07/2022   Gout due to renal impairment, unspecified site 04/07/2022   Hypertensive heart and chronic kidney disease with heart failure and with stage 5 chronic kidney disease, or end stage renal disease (HCC) 04/07/2022   Personal history of transient ischemic attack (TIA), and cerebral infarction without residual deficits 04/07/2022   Renal osteodystrophy 04/07/2022   Venous stasis ulcer of right calf (HCC) 03/31/2022   Fistula, colovaginal 03/26/2022   Diarrhea 03/26/2022   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   Sepsis without acute organ dysfunction (HCC)    Bacteremia    Acute pancreatitis 02/01/2022   Abdominal pain 02/01/2022   SIRS (systemic inflammatory response syndrome) (HCC) 02/01/2022   Transaminitis 02/01/2022   History of anemia due to chronic kidney disease 02/01/2022   Paroxysmal atrial fibrillation (HCC) 02/01/2022   Uncontrolled type 2 diabetes mellitus with hyperglycemia, with long-term current use of insulin (HCC) 01/14/2022   NSTEMI (non-ST  elevated myocardial infarction) (HCC) 03/05/2021   Acute renal failure superimposed on stage 4 chronic kidney disease (HCC) 08/22/2020   Hypoalbuminemia 05/25/2020   GERD (gastroesophageal reflux disease) 05/25/2020   Pressure injury of skin 05/17/2020   Acute on chronic combined systolic and diastolic congestive heart failure (HCC) 03/07/2020   Type 2 diabetes mellitus with diabetic polyneuropathy, with long-term current use of insulin (HCC) 03/07/2020   Obesity, Class III, BMI 40-49.9 (morbid obesity) (HCC) 03/07/2020   Common bile duct (CBD) obstruction 05/28/2019   Benign neoplasm of ascending colon    Benign neoplasm of transverse colon    Benign neoplasm of descending colon    Benign neoplasm of sigmoid colon    Gastric polyps    Hyperkalemia 03/11/2019   Prolonged QT interval 03/11/2019   Acute blood loss anemia 03/11/2019   Onychomycosis 06/21/2018   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)    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 (HCC)    PVD (peripheral vascular disease) (HCC) 10/26/2017   E-coli UTI 07/27/2017   Hypothyroidism 07/27/2017   AKI (acute kidney injury) (HCC)    PAH (pulmonary artery hypertension) (HCC)    Impaired ambulation 07/19/2017   Leg cramps 02/27/2017   Peripheral edema 01/12/2017   Diabetic neuropathy (HCC) 11/12/2016   CKD (chronic kidney disease), stage IV (HCC) 10/24/2015   Anemia of chronic disease 10/03/2015   Historical diagnosis of generalized anxiety disorder 10/03/2015   Secondary insomnia 10/03/2015   Acute bronchitis 09/05/2015   Hyperglycemia due to diabetes mellitus (HCC) 06/07/2015   Non compliance with medical treatment 04/17/2014   Rotator cuff tear 03/14/2014   Obesity 09/23/2013   Chronic HFrEF (heart failure with reduced ejection fraction) (HCC) 06/03/2013   Hypotension 12/25/2012   Hyponatremia 12/25/2012   Urinary incontinence    MDD (major depressive disorder), recurrent episode, moderate  (HCC) 11/12/2010   RBBB (right bundle branch block)    Wide-complex tachycardia    Coronary artery disease    Hyperlipemia 01/22/2009   Chronic hypotension 01/22/2009    ONSET DATE: 03/09/2023 MD referral to PT  REFERRING DIAG: S88.111D (ICD-10-CM) - Below-knee amputation of right lower extremity, subsequent encounter   THERAPY DIAG:  Unsteadiness on feet  Impaired functional mobility, balance, gait, and endurance  Muscle weakness (generalized)  Other abnormalities of gait and mobility  Right below-knee amputee (HCC)  Rationale for Evaluation and Treatment: Rehabilitation  SUBJECTIVE:  SUBJECTIVE STATEMENT: Patient states that she is having the same pain in her leg but is  relieved to hear that there is not a DVT.  States her leg is still swollen and hard to move.   Per prior conversation about commode height at home, husband states that the commode is 19-19.5in high with the [facility] able to fit over the commode.  PERTINENT HISTORY: Right TTA 05/02/22, Lymphedema, idiopathic chronic venous HTN with ulcer, depression, colitis, diverticulitis, ESRD, gout, NSTEMI, DM2, polyneuropathy, PVD, CAD, right bundle branch block, mini strokes  PAIN:  Are you having pain? No pain today.   PRECAUTIONS: Fall and Other: No BP LUE  WEIGHT BEARING RESTRICTIONS: No  FALLS: Has patient fallen in last 6 months? No  LIVING ENVIRONMENT: Lives with: lives with their spouse and 2 small dogs Lives in: mobile home Home Access: Ramped entrance Home layout: One level Stairs: Yes: External: 6-7 steps; can reach both Has following equipment at home: Single point cane, Environmental consultant - 2 wheeled, Environmental consultant - 4 wheeled, Wheelchair (manual), [employer], Grab bars, and Ramped entry  OCCUPATION:  retired  PLOF: prior to amputation for ~year used RW in home & community  PATIENT GOALS:  to use prosthesis to walk, get out w/c in & out of car, get to bathroom.   OBJECTIVE:  COGNITION: Overall cognitive status:   Eval on 03/23/2023:   Within functional limits for tasks assessed  POSTURE:  Eval on 03/23/2023:  rounded shoulders, forward head, increased thoracic kyphosis, flexed trunk , and weight shift left  LOWER EXTREMITY ROM: ROM Right eval Left eval  Hip flexion    Hip extension    Hip abduction    Hip adduction    Hip internal rotation    Hip external rotation    Knee flexion    Knee extension    Ankle dorsiflexion    Ankle plantarflexion    Ankle inversion    Ankle eversion     (Blank rows = not tested)  LOWER EXTREMITY MMT:  MMT Right eval Left eval  Hip flexion 3-/5 3-/5  Hip extension 2/5 2/5  Hip abduction 2+/5 2+/5  Hip adduction    Hip internal rotation    Hip external rotation    Knee flexion 3-/5 3-/5  Knee extension 3-/5 3-/5  Ankle dorsiflexion    Ankle plantarflexion    Ankle inversion    Ankle eversion    At Evaluation all strength testing is grossly seated and functionally standing / gait. (Blank rows = not tested)  TRANSFERS: 06/15/2023: -Sit to stand transfers with RW wheelchair<>mat table at varied height: 19in with maxA to rise to stand; 22in ModA rise to stand, 24in min-modA rise to stand. -stand pivot transfer with RW w/c to mat table with modA once standing.  -Lateral scooting wc<>table at level height SBA; vc for LE positioning for optimal use of LE during transfer -lateral scoot R: modA due to fatigue and uneven surface transfer from low surface to higher surface   05/04/2023: Squat pivot transfers with modA.  04/21/2023: Pt sit to stand w/c to //bar with one hand pushing on w/c and other hand on //bar with modA first time & MinA 2nd/ 3rd time.  Best is RUE on //bar & LUE on w/c.  Stand to sit reaching LUE to w/c with minA to control descent.   PT demo & verbal cues on sit to/from stand w/c to RW.  1st time modA 2 person. 2nd time maxA one person.  Stand to sit with minA and constant cueing. Scooting transfers with minA with w/c & mat direct  contact & same ht.   Eval on 03/23/2023:  Sit to stand: Max A (two-person assist) from 22" w/c to rolling walker Stand to sit: Max A (two-person assist) rolling walker to wheelchair  FUNCTIONAL TESTs:   06/15/2023: -Static stance for standing with RW; requiring Mod-MaxA for rise to stand d/t patient unsteady on feet posterior lean without proper righting strategy, SBA for stand   04/21/2023: Pt able to stand 60 sec with BUE on //bars with supervision.  Pt able to stand for 1 min with RW support with minA.  Eval on 03/23/2023: Patient maintained upright holding rolling walker with mod assist 2 people for safety for 30 seconds.  GAIT: 06/17/2023: Patient ambulated 53ft (maximal tolerated distance) with RW ModA (2 person for safety). Required VC for LE sequencing and minA to steer walker and prevent tip over.   04/21/2023: Pt amb 5' in //bars with modA (2 person for safety) with PT verbal & tactile cues on sequence and weight shift.  Pt amb 10' with RW with maxA (2 person for safety) with PT verbal & tactile cues on sequence and weight shift.  Eval on 03/23/2023:  Patient took 2 steps (one per LE) with +2 max assist with rolling walker and TTA prosthesis.  Patient adducting prosthesis stepping on left foot with no awareness.  CURRENT PROSTHETIC WEAR ASSESSMENT: 06/17/2023:  Independence with prosthetic care:  Patient and husband are independent with: skin check, prosthetic cleaning, ply sock cleaning, proper wear schedule/adjustment Patient is dependent with: residual limb care, correct ply sock adjustment, and proper weight-bearing schedule/adjustment Donning prosthesis: husband is independent with proper donning. Patient requires MaxA. Patient can verbally direct someone how to properly assist. Doffing prosthesis: Husband is independent with proper donning. Patient requires modA. Patient can verbally direct someone how to properly assist. Prosthetic wear tolerance: majority of  awake hours including dialysis hours, 1x/day, 3-4 days/week Prosthetic weight bearing tolerance: able to tolerate of standing without complains of prosthetic limb pain. Time not limited by limb discomfort but limited by muscle fatigue.   Eval on 03/23/2023:   Patient is dependent with: skin check, residual limb care, prosthetic cleaning, ply sock cleaning, correct ply sock adjustment, proper wear schedule/adjustment, and proper weight-bearing schedule/adjustment Donning prosthesis: Max A Doffing prosthesis: Min A Prosthetic wear tolerance: 2-6 hours, 1x/day, 3-4 days/week Prosthetic weight bearing tolerance: 3 minutes with limb pain Edema: pitting edema Residual limb condition: No open areas, dry skin, normal color and temperature, cylindrical shape Prosthetic description: Silicone liner with pin lock suspension, total contact socket with flexible inner socket, sach foot    TODAY'S  TREATMENT:                                                                                                                             DATE: 07/22/2023  Self care: Patient demonstrated don/doff of prosthetic leg and recalled key points for proper don/doffing. Required verbal cuing and hand over hand placement for instruction  on liner rolling and ensuring socks were not obstructing the pin. Increased time needed for activity. Set up assist for liner donning.  Educated on modified liner don/doffing with patient demonstrating understanding with verbal cuing.  Educated on using the mirror that she uses to check the bottoms of her feet to also check to see if the socks are not caught on the back of the pin. PT recommended pt to try to don prosthesis with husband's supervision on days that she is not going to dialysis.  Pt & husband verbalized understanding.    Prosthetic training: standing balance with RW- simulation for hygienic cleaning; posterior and lateral perturbations applied once patient was standing. STS  Mod-maxA with heavy vc for weight shifting to off-weight LE and to keep weight forward to prevent posterior collapse back to chair. PT demo & verbal cues on weight shifting to increase standing tolerance but needs to communicate with person assisting prior to doing.  Pt verbalized understanding.  Trial 1: 40sec Trail 2: 14sec    TREATMENT:                                                                                                                             DATE: 07/20/2023  Self care: Patient and husband educated on DVT risk, signs and symptoms, and urgency for doppler to rule out DVT. Patient and husband verbalized understanding.   Prosthetic training: Patient able to instruct on process on don/doffing prosthetic leg with set up assist and cuing with patient able to get to correct answer. Required education on how to verbalize line up of socket to ensure proper alignment. Verbal cues and demo used with patient able to verbalize and teach back.    TREATMENT:                                                                                                                             DATE: 07/08/2023  Therapeutic Activities: Patient ambulated 3' with 62* turn to orient to path and 90* to position in front of w/c with RW modA during turns and MinA straight path. walked from outside of bathroom to toilet to sit to build patient confidence in ability at home in the future. PT cues on sequencing and weight shift especially during turns.  Sit to Stand from wheelchair required modA to prevent posterior LOB. Use of railing to sit and stand from toilet requiring mod-maxA to  rise due to low surface without grab bar on L side.  Static standing to work on ability to maintain balance while husband assists at home in future. Patient able to tolerate standing with RW support with supervision for 3 min to allow time for simulation of hygienic cleaning. Required long duration seated rest break on toilet  after stand with patient demonstrating seated balance unsupported however with fatigue patient did rest trunk / back on commode.  Patient able to ambulate 55' with RW back out to wheelchair minA/CGA straight path and requiring min-modA for turning to sit in chair.    Self care: Patient and husband educated on use of BSC for over the toilet as it would raise the height and has armrests for patient to use for ease of sit/stand transfer. Also educated on using stool under feet while seated on toilet to aid with her ability for BM.  Patient and husband instructed to figure out height of toilet at home and [facility] for safe practice in clinic.     PATIENT EDUCATION: PATIENT EDUCATED ON FOLLOWING PROSTHETIC CARE: Education details: Use of stockinette under proximal liner to decrease itching sensation, no wounds presents to PT recommended not using Vive wear under liner Skin check, Prosthetic cleaning, Propper donning, and Proper wear schedule/adjustment Prosthetic wear tolerance: 3 hours 2x/day, 4 days/week Person educated: Patient and Spouse Education method: Explanation, Tactile cues, and Verbal cues Education comprehension: verbalized understanding, verbal cues required, tactile cues required, and needs further education  HOME EXERCISE PROGRAM: Access Code: [email or web address] URL: [email or web address] Date: 04/15/2023 Prepared by: Vladimir Faster  Exercises - Supine Bridge  - 1 x daily - 4 x weekly - 2 sets - 5 reps - 2 seconds hold - Supine Lower Trunk Rotation  - 1 x daily - 4 x weekly - 2 sets - 5 reps - 5 seconds hold - Supine Heel Slide with Strap  - 1 x daily - 4 x weekly - 2 sets - 5 reps - 2-3 seconds hold - Supine Hip Abduction  - 1 x daily - 4 x weekly - 2 sets - 5 reps - 2-3 seconds hold - Seated Hip Flexion Toward Target  - 1 x daily - 4 x weekly - 2 sets - 5 reps - 5 seconds hold - Seated Eccentric Abdominal Lean Back  - 1 x daily - 4 x weekly - 2 sets - 5 reps - 5 seconds hold -  Seated Sidebending  - 1 x daily - 4 x weekly - 2 sets - 5 reps - 5 seconds hold - Seated Trunk Rotation with Crossed Arms  - 1 x daily - 4 x weekly - 2 sets - 5 reps - 5 seconds hold   ASSESSMENT:  CLINICAL IMPRESSION:  Patient demonstrated good carryover with don/doffing prosthetic leg as she was able to recall key points sometimes needing cues. Instructed on attempting to don/doff prosthetic leg by herself on non-dialysis days with patient and husband verbalizing understanding and agreement. Patients continued pain on LLE hinders STS progressions as well as ambulation. Will continue to beneit from skilled physical therapy for increased function and independence.     OBJECTIVE IMPAIRMENTS: Abnormal gait, decreased activity tolerance, decreased balance, decreased endurance, decreased knowledge of condition, decreased knowledge of use of DME, decreased mobility, difficulty walking, decreased ROM, decreased strength, decreased safety awareness, postural dysfunction, prosthetic dependency , and pain.   ACTIVITY LIMITATIONS: standing, transfers, and locomotion level  PARTICIPATION LIMITATIONS: community activity, household mobility and dependency / burden of  care on family  PERSONAL FACTORS: Age, Fitness, Past/current experiences, Time since onset of injury/illness/exacerbation, and 3+ comorbidities: see PMH  are also affecting patient's functional outcome.   REHAB POTENTIAL: Good  CLINICAL DECISION MAKING: Evolving/moderate complexity  EVALUATION COMPLEXITY: Moderate   GOALS: Goals reviewed with patient? Yes  SHORT TERM GOALS: Target date: 07/15/2023:  Patient & husband verbalize proper residual limb care and understanding for adjusting ply socks.  Baseline: SEE OBJECTIVE DATA Goal status:  ongoing 07/06/2023 2.  Patient able to sit to stand w/c to rolling walker with 1 person modA and maintain upright with RW for 4 min with SBA to assist with toileting needs/hygienic cleaning.  Baseline:  SEE OBJECTIVE DATA Goal status: partially met 07/08/2023  3. Patient ambulates 47ft including turning 90* to position to sit with RW & prosthesis with modA to navigate reaching bathroom in home.  Baseline: SEE OBJECTIVE DATA Goal status: partially met 07/08/2023   LONG TERM GOALS: Target date: 09/10/2023  Patient & husband demonstrates & verbalizes understanding of prosthetic care to enable safe utilization of prosthesis. Baseline: SEE OBJECTIVE DATA Goal status: ongoing 07/06/2023  Patient tolerates prosthesis wear >80% of awake hours on non-dialysis days and >50% on dialysis days without skin issues and limb pain </= 4/10. Baseline: SEE OBJECTIVE DATA Goal status: MET 06/15/2023  Scooting transfers with minA and sit to/from stand transfers with modA.  Baseline: SEE OBJECTIVE DATA - scooting met but sit to stand MaxA Goal status: ongoing 07/06/2023  Patient ambulates >42ft including turning 90* to position to sit with prosthesis & RW with modA for household navigation.  Baseline: SEE OBJECTIVE DATA Goal status: ongoing 07/06/2023  Patient able to maintain static stance with RW support with SBA for 5 min to assist with hygienic cleaning.  Baseline: SEE OBJECTIVE DATA Goal status:  ongoing 07/06/2023   PLAN:  PT FREQUENCY: 2x/week  PT DURATION: 12 weeks  PLANNED INTERVENTIONS: 97164- PT Re-evaluation, 97110-Therapeutic exercises, 97530- Therapeutic activity, 97112- Neuromuscular re-education, [phone]- Self Care, 60454- Gait training, [phone]- Prosthetic training, Patient/Family education, Balance training, DME instructions, Therapeutic exercises, Therapeutic activity, Neuromuscular re-education, Gait training, and Self Care  PLAN FOR NEXT SESSION:  continue standing balance for hygienic cleaning, revisit feet walkback with STS transfers, 19in/low height commode transfers with husband assist.  Gage Treiber, Dwan Hemmelgarn, Student-PT 07/22/2023, 2:35 PM  This entire session of physical therapy was  performed under the direct supervision of PT signing evaluation /treatment. PT reviewed note and agrees.   Vladimir Faster, PT, DPT 07/22/2023, 3:08 PM

## 2023-07-24 DIAGNOSIS — N179 Acute kidney failure, unspecified: Secondary | ICD-10-CM | POA: Diagnosis not present

## 2023-07-24 DIAGNOSIS — K219 Gastro-esophageal reflux disease without esophagitis: Secondary | ICD-10-CM | POA: Diagnosis not present

## 2023-07-24 DIAGNOSIS — I5042 Chronic combined systolic (congestive) and diastolic (congestive) heart failure: Secondary | ICD-10-CM | POA: Diagnosis not present

## 2023-07-24 DIAGNOSIS — K626 Ulcer of anus and rectum: Secondary | ICD-10-CM | POA: Diagnosis not present

## 2023-07-24 DIAGNOSIS — M6281 Muscle weakness (generalized): Secondary | ICD-10-CM | POA: Diagnosis not present

## 2023-07-24 DIAGNOSIS — I5043 Acute on chronic combined systolic (congestive) and diastolic (congestive) heart failure: Secondary | ICD-10-CM | POA: Diagnosis not present

## 2023-07-24 DIAGNOSIS — Z992 Dependence on renal dialysis: Secondary | ICD-10-CM | POA: Diagnosis not present

## 2023-07-24 DIAGNOSIS — N186 End stage renal disease: Secondary | ICD-10-CM | POA: Diagnosis not present

## 2023-07-24 DIAGNOSIS — J9611 Chronic respiratory failure with hypoxia: Secondary | ICD-10-CM | POA: Diagnosis not present

## 2023-07-25 ENCOUNTER — Other Ambulatory Visit: Payer: Self-pay | Admitting: Family Medicine

## 2023-07-25 DIAGNOSIS — D689 Coagulation defect, unspecified: Secondary | ICD-10-CM | POA: Diagnosis not present

## 2023-07-25 DIAGNOSIS — N2581 Secondary hyperparathyroidism of renal origin: Secondary | ICD-10-CM | POA: Diagnosis not present

## 2023-07-25 DIAGNOSIS — Z992 Dependence on renal dialysis: Secondary | ICD-10-CM | POA: Diagnosis not present

## 2023-07-25 DIAGNOSIS — N186 End stage renal disease: Secondary | ICD-10-CM | POA: Diagnosis not present

## 2023-07-25 DIAGNOSIS — D509 Iron deficiency anemia, unspecified: Secondary | ICD-10-CM | POA: Diagnosis not present

## 2023-07-27 ENCOUNTER — Encounter: Payer: Self-pay | Admitting: Physical Therapy

## 2023-07-27 ENCOUNTER — Ambulatory Visit: Payer: PPO | Admitting: Physical Therapy

## 2023-07-27 DIAGNOSIS — R2681 Unsteadiness on feet: Secondary | ICD-10-CM

## 2023-07-27 DIAGNOSIS — Z7409 Other reduced mobility: Secondary | ICD-10-CM

## 2023-07-27 DIAGNOSIS — R2689 Other abnormalities of gait and mobility: Secondary | ICD-10-CM | POA: Diagnosis not present

## 2023-07-27 DIAGNOSIS — Z89511 Acquired absence of right leg below knee: Secondary | ICD-10-CM

## 2023-07-27 DIAGNOSIS — M6281 Muscle weakness (generalized): Secondary | ICD-10-CM

## 2023-07-27 NOTE — Therapy (Cosign Needed)
 OUTPATIENT PHYSICAL THERAPY PROSTHETIC TREATMENT    Patient Name: Susan [name] MRN: [id number] DOB:July 30, 1950, 73 y.o., female Today's Date: 07/28/2023  PCP: Donita Brooks, MD  REFERRING PROVIDER: Nadara Mustard, MD   END OF SESSION:  PT End of Session - 07/27/23 1520     Visit Number 29    Number of Visits 44    Date for PT Re-Evaluation 09/10/23    Authorization Type [employer]    Progress Note Due on Visit 30    PT Start Time 1430    PT Stop Time 1512    PT Time Calculation (min) 42 min    Equipment Utilized During Treatment Gait belt    Activity Tolerance Patient tolerated treatment well;Patient limited by fatigue    Behavior During Therapy WFL for tasks assessed/performed              Past Medical History:  Diagnosis Date   Anemia    hx   Anxiety    Arthritis    "generalized" (03/15/2014)   CAD (coronary artery disease)    MI in 2000 - MI  2007 - treated bare metal stent (no nuclear since then as 9/11)   Carotid artery disease (HCC)    Chronic diastolic heart failure (HCC)    a) ECHO (08/2013) EF 55-60% and RV function nl b) RHC (08/2013) RA 4, RV 30/5/7, PA 25/10 (16), PCWP 7, Fick CO/CI 6.3/2.7, PVR 1.5 WU, PA 61 and 66%   Daily headache    "~ every other day; since I fell in June" (03/15/2014)   Depression    Diabetic retinopathy (HCC)    Dyslipidemia    ESRD (end stage renal disease) (HCC)    Dialysis on Tues Thurs Sat   Exertional shortness of breath    GERD (gastroesophageal reflux disease)    History of blood transfusion    History of kidney stones    HTN (hypertension)    Hypothyroidism    Myocardial infarction (HCC)    Obesity    Osteoarthritis    PAF (paroxysmal atrial fibrillation) (HCC)    Peripheral neuropathy    bilateral feet/hands   PONV (postoperative nausea and vomiting)    RBBB (right bundle branch block)    Old   Stroke (HCC)    mini strokes   Type II diabetes mellitus (HCC)    Type II, [name] left  upper arm. patient has omnipod insulin pump with Novolin R Insulin   Past Surgical History:  Procedure Laterality Date   A/V FISTULAGRAM Left 11/07/2022   Procedure: A/V Fistulagram;  Surgeon: Leonie Douglas, MD;  [facility]: [facility];  Service: Cardiovascular;  Laterality: Left;   ABDOMINAL HYSTERECTOMY  1980's   AMPUTATION Right 02/24/2018   Procedure: RIGHT FOOT GREAT TOE AND 2ND TOE AMPUTATION;  Surgeon: Nadara Mustard, MD;  Location: [facility] OR;  Service: Orthopedics;  Laterality: Right;   AMPUTATION Right 04/30/2018   Procedure: RIGHT TRANSMETATARSAL AMPUTATION;  Surgeon: Nadara Mustard, MD;  Location: [facility] OR;  Service: Orthopedics;  Laterality: Right;   AMPUTATION Right 05/02/2022   Procedure: RIGHT BELOW KNEE AMPUTATION;  Surgeon: Nadara Mustard, MD;  Location: [facility] OR;  Service: Orthopedics;  Laterality: Right;   APPLICATION OF WOUND VAC Right 06/13/2022   Procedure: APPLICATION OF WOUND VAC;  Surgeon: Nadara Mustard, MD;  Location: [facility] OR;  Service: Orthopedics;  Laterality: Right;   APPLICATION OF WOUND VAC Left 11/14/2022   Procedure: APPLICATION OF WOUND  VAC;  Surgeon: Nadara Mustard, MD;  Location: [facility] OR;  Service: Orthopedics;  Laterality: Left;   AV FISTULA PLACEMENT Left 04/02/2022   Procedure: LEFT ARM ARTERIOVENOUS (AV) FISTULA CREATION;  Surgeon: Nada Libman, MD;  Location: [facility] OR;  Service: Vascular;  Laterality: Left;  PERIPHERAL NERVE BLOCK   AV FISTULA PLACEMENT Right 04/27/2023   Procedure: RIGHT ARM BRACHIOBASILIC ARTERIOVENOUS (AV) FISTULA CREATION;  Surgeon: Leonie Douglas, MD;  Location: [facility] OR;  Service: Vascular;  Laterality: Right;   BASCILIC VEIN TRANSPOSITION Left 07/31/2022   Procedure: LEFT ARM SECOND STAGE BASILIC VEIN TRANSPOSITION;  Surgeon: Nada Libman, MD;  Location: [facility] OR;  Service: Vascular;  Laterality: Left;   BIOPSY  05/27/2020   Procedure: BIOPSY;  Surgeon: Lanelle Bal, DO;  Location: [facility];  Service: Endoscopy;;   CATARACT  EXTRACTION, BILATERAL Bilateral ?2013   COLONOSCOPY W/ POLYPECTOMY     COLONOSCOPY WITH PROPOFOL N/A 03/13/2019   Procedure: COLONOSCOPY WITH PROPOFOL;  Surgeon: Beverley Fiedler, MD;  Location: [facility] ENDOSCOPY;  Service: Gastroenterology;  Laterality: N/A;   CORONARY ANGIOPLASTY WITH STENT PLACEMENT  1999; 2007   "1 + 1"   ERCP N/A 02/03/2022   Procedure: ENDOSCOPIC RETROGRADE CHOLANGIOPANCREATOGRAPHY (ERCP);  Surgeon: Jeani Hawking, MD;  Location: [facility] ENDOSCOPY;  Service: Gastroenterology;  Laterality: N/A;   ESOPHAGOGASTRODUODENOSCOPY N/A 02/12/2023   Procedure: ESOPHAGOGASTRODUODENOSCOPY (EGD);  Surgeon: Jeani Hawking, MD;  Location: [facility] ENDOSCOPY;  Service: Gastroenterology;  Laterality: N/A;   ESOPHAGOGASTRODUODENOSCOPY (EGD) WITH PROPOFOL N/A 03/13/2019   Procedure: ESOPHAGOGASTRODUODENOSCOPY (EGD) WITH PROPOFOL;  Surgeon: Beverley Fiedler, MD;  Location: [facility] ENDOSCOPY;  Service: Gastroenterology;  Laterality: N/A;   ESOPHAGOGASTRODUODENOSCOPY (EGD) WITH PROPOFOL N/A 05/27/2020   Procedure: ESOPHAGOGASTRODUODENOSCOPY (EGD) WITH PROPOFOL;  Surgeon: Lanelle Bal, DO;  [facility];  Service: Endoscopy;  Laterality: N/A;   ESOPHAGOGASTRODUODENOSCOPY (EGD) WITH PROPOFOL N/A 09/03/2022   Procedure: ESOPHAGOGASTRODUODENOSCOPY (EGD) WITH PROPOFOL;  Surgeon: Jeani Hawking, MD;  Location: [facility] ENDOSCOPY;  Service: Gastroenterology;  Laterality: N/A;   EYE SURGERY Bilateral    lazer   FLEXIBLE SIGMOIDOSCOPY N/A 05/23/2022   Procedure: FLEXIBLE SIGMOIDOSCOPY;  Surgeon: Jeani Hawking, MD;  Location: [facility] ENDOSCOPY;  Service: Gastroenterology;  Laterality: N/A;   FLEXIBLE SIGMOIDOSCOPY N/A 05/24/2022   Procedure: FLEXIBLE SIGMOIDOSCOPY;  Surgeon: Imogene Burn, MD;  Location: [facility] ENDOSCOPY;  Service: Gastroenterology;  Laterality: N/A;   FLEXIBLE SIGMOIDOSCOPY N/A 09/03/2022   Procedure: FLEXIBLE SIGMOIDOSCOPY;  Surgeon: Jeani Hawking, MD;  Location: [facility] ENDOSCOPY;  Service: Gastroenterology;  Laterality:  N/A;   HEMOSTASIS CLIP PLACEMENT  03/13/2019   Procedure: HEMOSTASIS CLIP PLACEMENT;  Surgeon: Beverley Fiedler, MD;  Location: [facility] ENDOSCOPY;  Service: Gastroenterology;;   HEMOSTASIS CLIP PLACEMENT  05/23/2022   Procedure: HEMOSTASIS CLIP PLACEMENT;  Surgeon: Jeani Hawking, MD;  Location: [facility] ENDOSCOPY;  Service: Gastroenterology;;   HEMOSTASIS CONTROL  05/24/2022   Procedure: HEMOSTASIS CONTROL;  Surgeon: Imogene Burn, MD;  Location: [facility] ENDOSCOPY;  Service: Gastroenterology;;   HOT HEMOSTASIS N/A 05/23/2022   Procedure: HOT HEMOSTASIS (ARGON PLASMA COAGULATION/BICAP);  Surgeon: Jeani Hawking, MD;  Location: [facility] ENDOSCOPY;  Service: Gastroenterology;  Laterality: N/A;   I & D EXTREMITY Left 05/05/2022   Procedure: IRRIGATION AND DEBRIDEMENT LEFT ARM AV FISTULA;  Surgeon: Cephus Shelling, MD;  Location: [facility] OR;  Service: Vascular;  Laterality: Left;   I & D EXTREMITY N/A 11/14/2022   Procedure: IRRIGATION AND DEBRIDEMENT OF LOWER EXTREMITY WOUND;  Surgeon: Nadara Mustard, MD;  Location: [facility]  OR;  Service: Orthopedics;  Laterality: N/A;   INSERTION OF DIALYSIS CATHETER Right 04/02/2022   Procedure: INSERTION OF TUNNELED DIALYSIS CATHETER;  Surgeon: Nada Libman, MD;  Location: [facility] OR;  Service: Vascular;  Laterality: Right;   KNEE ARTHROSCOPY Left 10/25/2006   POLYPECTOMY  03/13/2019   Procedure: POLYPECTOMY;  Surgeon: Beverley Fiedler, MD;  Location: [facility] ENDOSCOPY;  Service: Gastroenterology;;   REMOVAL OF STONES  02/03/2022   Procedure: REMOVAL OF STONES;  Surgeon: Jeani Hawking, MD;  Location: [facility] ENDOSCOPY;  Service: Gastroenterology;;   REVISON OF ARTERIOVENOUS FISTULA Left 08/20/2022   Procedure: REVISON OF LEFT ARM ARTERIOVENOUS FISTULA;  Surgeon: Nada Libman, MD;  Location: [facility] OR;  Service: Vascular;  Laterality: Left;   RIGHT HEART CATH N/A 07/24/2017   Procedure: RIGHT HEART CATH;  Surgeon: Dolores Patty, MD;  [facility]: [facility];  Service: Cardiovascular;  Laterality: N/A;    RIGHT HEART CATHETERIZATION N/A 09/22/2013   Procedure: RIGHT HEART CATH;  Surgeon: Dolores Patty, MD;  Location: [facility] CATH LAB;  Service: Cardiovascular;  Laterality: N/A;   SHOULDER ARTHROSCOPY WITH OPEN ROTATOR CUFF REPAIR Right 03/14/2014   Procedure: RIGHT SHOULDER ARTHROSCOPY WITH BICEPS RELEASE, OPEN SUBSCAPULA REPAIR, OPEN SUPRASPINATUS REPAIR.;  Surgeon: Cammy Copa, MD;  Location: [facility] OR;  Service: Orthopedics;  Laterality: Right;   SPHINCTEROTOMY  02/03/2022   Procedure: SPHINCTEROTOMY;  Surgeon: Jeani Hawking, MD;  Location: [facility] ENDOSCOPY;  Service: Gastroenterology;;   STUMP REVISION Right 06/13/2022   Procedure: REVISION RIGHT BELOW KNEE AMPUTATION;  Surgeon: Nadara Mustard, MD;  Location: [facility] OR;  Service: Orthopedics;  Laterality: Right;   TEE WITHOUT CARDIOVERSION N/A 02/04/2022   Procedure: TRANSESOPHAGEAL ECHOCARDIOGRAM (TEE);  Surgeon: Dolores Patty, MD;  Location: [facility] ENDOSCOPY;  Service: Cardiovascular;  Laterality: N/A;   THROMBECTOMY W/ EMBOLECTOMY Left 08/20/2022   Procedure: THROMBECTOMY OF LEFT ARM ARTERIOVENOUS FISTULA;  Surgeon: Nada Libman, MD;  Location: [facility] OR;  Service: Vascular;  Laterality: Left;   TOE AMPUTATION Right 02/24/2018   GREAT TOE AND 2ND TOE AMPUTATION   TUBAL LIGATION  1970's   Patient Active Problem List   Diagnosis Date Noted   Atrial fibrillation (HCC) 06/17/2023   Atopic dermatitis 06/17/2023   Hyperosmolar hyperglycemic state (HHS) (HCC) 02/05/2023   Gastroparesis 02/05/2023   Depression 01/29/2023   Intractable nausea and vomiting 01/20/2023   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) 04/07/2022  Allergy, unspecified, initial encounter 04/07/2022   Dependence on renal dialysis (HCC) 04/07/2022   Gout due to renal impairment, unspecified site 04/07/2022   Hypertensive heart and chronic kidney disease with heart failure and with stage 5 chronic kidney disease, or end stage renal disease (HCC) 04/07/2022   Personal history of transient ischemic attack (TIA), and cerebral infarction without residual deficits 04/07/2022   Renal osteodystrophy 04/07/2022   Venous stasis ulcer of right calf (HCC) 03/31/2022   Fistula, colovaginal 03/26/2022   Diarrhea 03/26/2022   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   Sepsis without acute organ dysfunction (HCC)    Bacteremia    Acute pancreatitis 02/01/2022   Abdominal pain 02/01/2022   SIRS (systemic inflammatory response syndrome) (HCC) 02/01/2022   Transaminitis 02/01/2022   History of anemia due to chronic kidney disease 02/01/2022   Paroxysmal atrial fibrillation (HCC) 02/01/2022   Uncontrolled type 2 diabetes mellitus with hyperglycemia, with long-term current use of insulin (HCC) 01/14/2022   NSTEMI (non-ST  elevated myocardial infarction) (HCC) 03/05/2021   Acute renal failure superimposed on stage 4 chronic kidney disease (HCC) 08/22/2020   Hypoalbuminemia 05/25/2020   GERD (gastroesophageal reflux disease) 05/25/2020   Pressure injury of skin 05/17/2020   Acute on chronic combined systolic and diastolic congestive heart failure (HCC) 03/07/2020   Type 2 diabetes mellitus with diabetic polyneuropathy, with long-term current use of insulin (HCC) 03/07/2020   Obesity, Class III, BMI 40-49.9 (morbid obesity) (HCC) 03/07/2020   Common bile duct (CBD) obstruction 05/28/2019   Benign neoplasm of ascending colon    Benign neoplasm of transverse colon    Benign neoplasm of descending colon    Benign neoplasm of sigmoid colon    Gastric polyps    Hyperkalemia 03/11/2019   Prolonged QT interval 03/11/2019   Acute blood loss anemia 03/11/2019   Onychomycosis 06/21/2018   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)    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 (HCC)    PVD (peripheral vascular disease) (HCC) 10/26/2017   E-coli UTI 07/27/2017   Hypothyroidism 07/27/2017   AKI (acute kidney injury) (HCC)    PAH (pulmonary artery hypertension) (HCC)    Impaired ambulation 07/19/2017   Leg cramps 02/27/2017   Peripheral edema 01/12/2017   Diabetic neuropathy (HCC) 11/12/2016   CKD (chronic kidney disease), stage IV (HCC) 10/24/2015   Anemia of chronic disease 10/03/2015   Historical diagnosis of generalized anxiety disorder 10/03/2015   Secondary insomnia 10/03/2015   Acute bronchitis 09/05/2015   Hyperglycemia due to diabetes mellitus (HCC) 06/07/2015   Non compliance with medical treatment 04/17/2014   Rotator cuff tear 03/14/2014   Obesity 09/23/2013   Chronic HFrEF (heart failure with reduced ejection fraction) (HCC) 06/03/2013   Hypotension 12/25/2012   Hyponatremia 12/25/2012   Urinary incontinence    MDD (major depressive disorder), recurrent episode, moderate  (HCC) 11/12/2010   RBBB (right bundle branch block)    Wide-complex tachycardia    Coronary artery disease    Hyperlipemia 01/22/2009   Chronic hypotension 01/22/2009    ONSET DATE: 03/09/2023 MD referral to PT  REFERRING DIAG: S88.111D (ICD-10-CM) - Below-knee amputation of right lower extremity, subsequent encounter   THERAPY DIAG:  Unsteadiness on feet  Impaired functional mobility, balance, gait, and endurance  Muscle weakness (generalized)  Other abnormalities of gait and mobility  Rationale for Evaluation and Treatment: Rehabilitation  SUBJECTIVE:  SUBJECTIVE STATEMENT: Patient states she is doing well today. She states that she didn't try getting her prosthetic leg  on/off due to the height of the bed. Was reminded that her bed lowers and encouraged to continue attempts at independence. Husband stated that it is about 49ft that the patient will need to walk before being able to sit down on toilet.   PERTINENT HISTORY: Right TTA 05/02/22, Lymphedema, idiopathic chronic venous HTN with ulcer, depression, colitis, diverticulitis, ESRD, gout, NSTEMI, DM2, polyneuropathy, PVD, CAD, right bundle branch block, mini strokes  PAIN:  Are you having pain? No pain today.   PRECAUTIONS: Fall and Other: No BP LUE  WEIGHT BEARING RESTRICTIONS: No  FALLS: Has patient fallen in last 6 months? No  LIVING ENVIRONMENT: Lives with: lives with their spouse and 2 small dogs Lives in: mobile home Home Access: Ramped entrance Home layout: One level Stairs: Yes: External: 6-7 steps; can reach both Has following equipment at home: Single point cane, Environmental consultant - 2 wheeled, Environmental consultant - 4 wheeled, Wheelchair (manual), [employer], Grab bars, and Ramped entry  OCCUPATION:  retired  PLOF: prior to amputation for ~year used RW in home & community  PATIENT GOALS:  to use prosthesis to walk, get out w/c in & out of car, get to bathroom.   OBJECTIVE:  COGNITION: Overall cognitive status:  Eval on  03/23/2023:   Within functional limits for tasks assessed  POSTURE:  Eval on 03/23/2023:  rounded shoulders, forward head, increased thoracic kyphosis, flexed trunk , and weight shift left  LOWER EXTREMITY ROM: ROM Right eval Left eval  Hip flexion    Hip extension    Hip abduction    Hip adduction    Hip internal rotation    Hip external rotation    Knee flexion    Knee extension    Ankle dorsiflexion    Ankle plantarflexion    Ankle inversion    Ankle eversion     (Blank rows = not tested)  LOWER EXTREMITY MMT:  MMT Right eval Left eval  Hip flexion 3-/5 3-/5  Hip extension 2/5 2/5  Hip abduction 2+/5 2+/5  Hip adduction    Hip internal rotation    Hip external rotation    Knee flexion 3-/5 3-/5  Knee extension 3-/5 3-/5  Ankle dorsiflexion    Ankle plantarflexion    Ankle inversion    Ankle eversion    At Evaluation all strength testing is grossly seated and functionally standing / gait. (Blank rows = not tested)  TRANSFERS: 07/27/2023: -Sit>stand from wc with RW: MinA with heavy vc for sequencing and offweighting LE -Stand>sit wc with RW: CGA with patient performing safe controlled descent  06/15/2023: -Sit to stand transfers with RW wheelchair<>mat table at varied height: 19in with maxA to rise to stand; 22in ModA rise to stand, 24in min-modA rise to stand. -stand pivot transfer with RW w/c to mat table with modA once standing.  -Lateral scooting wc<>table at level height SBA; vc for LE positioning for optimal use of LE during transfer -lateral scoot R: modA due to fatigue and uneven surface transfer from low surface to higher surface   05/04/2023: Squat pivot transfers with modA.  04/21/2023: Pt sit to stand w/c to //bar with one hand pushing on w/c and other hand on //bar with modA first time & MinA 2nd/ 3rd time.  Best is RUE on //bar & LUE on w/c.  Stand to sit reaching LUE to w/c with minA to control descent.   PT demo & verbal cues on sit to/from  stand w/c to RW.  1st time modA 2  person. 2nd time maxA one person.  Stand to sit with minA and constant cueing. Scooting transfers with minA with w/c & mat direct contact & same ht.   Eval on 03/23/2023:  Sit to stand: Max A (two-person assist) from 22" w/c to rolling walker Stand to sit: Max A (two-person assist) rolling walker to wheelchair  FUNCTIONAL TESTs:   07/27/2023: Patient stood with RW SBA for 54sec before c/o lightheadedness and sitting down to toilet. Resisted nudges applied posteriorly to simulate hygienic cleaning. VC for weight shifts when LE become tired.  BP: after standing 24min54sec 161/82 HR 92  06/15/2023: -Static stance for standing with RW; requiring Mod-MaxA for rise to stand d/t patient unsteady on feet posterior lean without proper righting strategy, SBA for stand   04/21/2023: Pt able to stand 60 sec with BUE on //bars with supervision.  Pt able to stand for 1 min with RW support with minA.  Eval on 03/23/2023: Patient maintained upright holding rolling walker with mod assist 2 people for safety for 30 seconds.  GAIT: 07/27/2023: Patient ambulated 73ft with narrow bathroom entry and 90deg turn to sit on commode CGA with minA and heavy vc for progression and sequencing of RW. While turning to sit on commode patient lost balance laterally requiring mod-maxA from therapist and patient use of grab bar to regain balance.   06/17/2023: Patient ambulated 10ft (maximal tolerated distance) with RW ModA (2 person for safety). Required VC for LE sequencing and minA to steer walker and prevent tip over.   04/21/2023: Pt amb 5' in //bars with modA (2 person for safety) with PT verbal & tactile cues on sequence and weight shift.  Pt amb 10' with RW with maxA (2 person for safety) with PT verbal & tactile cues on sequence and weight shift.  Eval on 03/23/2023:  Patient took 2 steps (one per LE) with +2 max assist with rolling walker and TTA prosthesis.  Patient  adducting prosthesis stepping on left foot with no awareness.  CURRENT PROSTHETIC WEAR ASSESSMENT: 06/17/2023:  Independence with prosthetic care:  Patient and husband are independent with: skin check, prosthetic cleaning, ply sock cleaning, proper wear schedule/adjustment Patient is dependent with: residual limb care, correct ply sock adjustment, and proper weight-bearing schedule/adjustment Donning prosthesis: husband is independent with proper donning. Patient requires MaxA. Patient can verbally direct someone how to properly assist. Doffing prosthesis: Husband is independent with proper donning. Patient requires modA. Patient can verbally direct someone how to properly assist. Prosthetic wear tolerance: majority of awake hours including dialysis hours, 1x/day, 3-4 days/week Prosthetic weight bearing tolerance: able to tolerate of standing without complains of prosthetic limb pain. Time not limited by limb discomfort but limited by muscle fatigue.   Eval on 03/23/2023:   Patient is dependent with: skin check, residual limb care, prosthetic cleaning, ply sock cleaning, correct ply sock adjustment, proper wear schedule/adjustment, and proper weight-bearing schedule/adjustment Donning prosthesis: Max A Doffing prosthesis: Min A Prosthetic wear tolerance: 2-6 hours, 1x/day, 3-4 days/week Prosthetic weight bearing tolerance: 3 minutes with limb pain Edema: pitting edema Residual limb condition: No open areas, dry skin, normal color and temperature, cylindrical shape Prosthetic description: Silicone liner with pin lock suspension, total contact socket with flexible inner socket, sach foot    TODAY'S TREATMENT:  DATE: 07/27/2023 -Patient ambulated 15ft with narrow bathroom entry and 90deg turn to sit on commode CGA with minA and heavy vc for progression and sequencing of  RW. While turning to sit on commode patient lost balance laterally requiring mod-maxA from therapist and patient use of grab bar to regain balance.  -Stand>sit 18in toilet: modA due to low height and only 1 grab bar -Sit>stand 18in toilet: Mod-maxA with use of grab bar, wheelchair armrest (simulate 1 of armrests on raised toilet seat), and RW.  -Patient stood with RW SBA for 54sec before c/o lightheadedness and sitting down to toilet. Resisted nudges applied posteriorly to simulate hygienic cleaning. VC for weight shifts when LE become tired.  BP: after standing 63min54sec 161/82 HR 92 -Sit>stand from wc with RW: MinA with heavy vc for sequencing and offweighting LE -Stand>sit wc with RW: CGA with patient performing safe controlled descent -Patient ambulated 21ft CGA to wheelchair and required MaxA when turning due to posterior LOB requiring therapist assist to prevent fall    TREATMENT:                                                                                                                             DATE: 07/22/2023  Self care: Patient demonstrated don/doff of prosthetic leg and recalled key points for proper don/doffing. Required verbal cuing and hand over hand placement for instruction on liner rolling and ensuring socks were not obstructing the pin. Increased time needed for activity. Set up assist for liner donning.  Educated on modified liner don/doffing with patient demonstrating understanding with verbal cuing.  Educated on using the mirror that she uses to check the bottoms of her feet to also check to see if the socks are not caught on the back of the pin. PT recommended pt to try to don prosthesis with husband's supervision on days that she is not going to dialysis.  Pt & husband verbalized understanding.    Prosthetic training: standing balance with RW- simulation for hygienic cleaning; posterior and lateral perturbations applied once patient was standing. STS Mod-maxA with  heavy vc for weight shifting to off-weight LE and to keep weight forward to prevent posterior collapse back to chair. PT demo & verbal cues on weight shifting to increase standing tolerance but needs to communicate with person assisting prior to doing.  Pt verbalized understanding.  Trial 1: 40sec Trail 2: 14sec    TREATMENT:  DATE: 07/20/2023  Self care: Patient and husband educated on DVT risk, signs and symptoms, and urgency for doppler to rule out DVT. Patient and husband verbalized understanding.   Prosthetic training: Patient able to instruct on process on don/doffing prosthetic leg with set up assist and cuing with patient able to get to correct answer. Required education on how to verbalize line up of socket to ensure proper alignment. Verbal cues and demo used with patient able to verbalize and teach back.    TREATMENT:                                                                                                                             DATE: 07/08/2023  Therapeutic Activities: Patient ambulated 1' with 66* turn to orient to path and 90* to position in front of w/c with RW modA during turns and MinA straight path. walked from outside of bathroom to toilet to sit to build patient confidence in ability at home in the future. PT cues on sequencing and weight shift especially during turns.  Sit to Stand from wheelchair required modA to prevent posterior LOB. Use of railing to sit and stand from toilet requiring mod-maxA to rise due to low surface without grab bar on L side.  Static standing to work on ability to maintain balance while husband assists at home in future. Patient able to tolerate standing with RW support with supervision for 3 min to allow time for simulation of hygienic cleaning. Required long duration seated rest break on toilet after stand  with patient demonstrating seated balance unsupported however with fatigue patient did rest trunk / back on commode.  Patient able to ambulate 2' with RW back out to wheelchair minA/CGA straight path and requiring min-modA for turning to sit in chair.    Self care: Patient and husband educated on use of BSC for over the toilet as it would raise the height and has armrests for patient to use for ease of sit/stand transfer. Also educated on using stool under feet while seated on toilet to aid with her ability for BM.  Patient and husband instructed to figure out height of toilet at home and [facility] for safe practice in clinic.     PATIENT EDUCATION: PATIENT EDUCATED ON FOLLOWING PROSTHETIC CARE: Education details: Use of stockinette under proximal liner to decrease itching sensation, no wounds presents to PT recommended not using Vive wear under liner Skin check, Prosthetic cleaning, Propper donning, and Proper wear schedule/adjustment Prosthetic wear tolerance: 3 hours 2x/day, 4 days/week Person educated: Patient and Spouse Education method: Explanation, Tactile cues, and Verbal cues Education comprehension: verbalized understanding, verbal cues required, tactile cues required, and needs further education  HOME EXERCISE PROGRAM: Access Code: [email or web address] URL: [email or web address] Date: 04/15/2023 Prepared by: Vladimir Faster  Exercises - Supine Bridge  - 1 x daily - 4 x weekly - 2 sets - 5 reps - 2 seconds hold - Supine Lower Trunk Rotation  - 1 x  daily - 4 x weekly - 2 sets - 5 reps - 5 seconds hold - Supine Heel Slide with Strap  - 1 x daily - 4 x weekly - 2 sets - 5 reps - 2-3 seconds hold - Supine Hip Abduction  - 1 x daily - 4 x weekly - 2 sets - 5 reps - 2-3 seconds hold - Seated Hip Flexion Toward Target  - 1 x daily - 4 x weekly - 2 sets - 5 reps - 5 seconds hold - Seated Eccentric Abdominal Lean Back  - 1 x daily - 4 x weekly - 2 sets - 5 reps - 5 seconds hold - Seated  Sidebending  - 1 x daily - 4 x weekly - 2 sets - 5 reps - 5 seconds hold - Seated Trunk Rotation with Crossed Arms  - 1 x daily - 4 x weekly - 2 sets - 5 reps - 5 seconds hold   ASSESSMENT:  CLINICAL IMPRESSION:  Patient continues to work to increase more independence/lessen caregier burden with bathroom tasks. Continues to demonstrate deficits in endurance and balance however was able to stand SBA with RW for almost demonstrating increased balance and standing tolerance. Will benefit from continued skilled physical therapy to address deficits.     OBJECTIVE IMPAIRMENTS: Abnormal gait, decreased activity tolerance, decreased balance, decreased endurance, decreased knowledge of condition, decreased knowledge of use of DME, decreased mobility, difficulty walking, decreased ROM, decreased strength, decreased safety awareness, postural dysfunction, prosthetic dependency , and pain.   ACTIVITY LIMITATIONS: standing, transfers, and locomotion level  PARTICIPATION LIMITATIONS: community activity, household mobility and dependency / burden of care on family  PERSONAL FACTORS: Age, Fitness, Past/current experiences, Time since onset of injury/illness/exacerbation, and 3+ comorbidities: see PMH  are also affecting patient's functional outcome.   REHAB POTENTIAL: Good  CLINICAL DECISION MAKING: Evolving/moderate complexity  EVALUATION COMPLEXITY: Moderate   GOALS: Goals reviewed with patient? Yes  SHORT TERM GOALS: Target date: 07/15/2023:  Patient & husband verbalize proper residual limb care and understanding for adjusting ply socks.  Baseline: SEE OBJECTIVE DATA Goal status:   MET 07/27/2023 2.  Patient able to sit to stand w/c to rolling walker with 1 person modA and maintain upright with RW for 4 min with SBA to assist with toileting needs/hygienic cleaning.  Baseline: SEE OBJECTIVE DATA Goal status: partially met 07/08/2023  3. Patient ambulates 33ft including turning 90* to position  to sit with RW & prosthesis with modA to navigate reaching bathroom in home.  Baseline: SEE OBJECTIVE DATA Goal status: partially met 07/08/2023   LONG TERM GOALS: Target date: 09/10/2023  Patient & husband demonstrates & verbalizes understanding of prosthetic care to enable safe utilization of prosthesis. Baseline: SEE OBJECTIVE DATA Goal status: ongoing 07/27/2023  Patient tolerates prosthesis wear >80% of awake hours on non-dialysis days and >50% on dialysis days without skin issues and limb pain </= 4/10. Baseline: SEE OBJECTIVE DATA Goal status: MET 06/15/2023  Scooting transfers with minA and sit to/from stand transfers with modA.  Baseline: SEE OBJECTIVE DATA Goal status: ongoing  07/27/2023  Patient ambulates >23ft including turning 90* to position to sit with prosthesis & RW with modA for household navigation.  Baseline: SEE OBJECTIVE DATA Goal status: ongoing  07/27/2023  Patient able to maintain static stance with RW support with SBA for 5 min to assist with hygienic cleaning.  Baseline: SEE OBJECTIVE DATA Goal status:  ongoing  07/27/2023   PLAN:  PT FREQUENCY: 2x/week  PT DURATION:  12 weeks  PLANNED INTERVENTIONS: 97164- PT Re-evaluation, 97110-Therapeutic exercises, 97530- Therapeutic activity, 97112- Neuromuscular re-education, [phone]- Self Care, 60454- Gait training, [phone]- Prosthetic training, Patient/Family education, Balance training, DME instructions, Therapeutic exercises, Therapeutic activity, Neuromuscular re-education, Gait training, and Self Care  PLAN FOR NEXT SESSION:  continue standing balance for hygienic cleaning, 19in/low height commode transfers with husband assist, standing balance with weight shifts to offload BLE when fatigued. Turning/positioning with less assist from therapist- ensure correct sequencing of wt shifts and moving walker.    Merritt Kibby, Luan Urbani, Student-PT 07/27/2023, 3:22 PM  This entire session of physical therapy was performed under the  direct supervision of PT signing evaluation /treatment. PT reviewed note and agrees.   Vladimir Faster, PT, DPT 07/28/2023, 7:03 AM

## 2023-07-28 DIAGNOSIS — N186 End stage renal disease: Secondary | ICD-10-CM | POA: Diagnosis not present

## 2023-07-28 DIAGNOSIS — N2581 Secondary hyperparathyroidism of renal origin: Secondary | ICD-10-CM | POA: Diagnosis not present

## 2023-07-28 DIAGNOSIS — Z992 Dependence on renal dialysis: Secondary | ICD-10-CM | POA: Diagnosis not present

## 2023-07-28 DIAGNOSIS — D689 Coagulation defect, unspecified: Secondary | ICD-10-CM | POA: Diagnosis not present

## 2023-07-29 ENCOUNTER — Ambulatory Visit: Payer: PPO | Admitting: Physical Therapy

## 2023-07-29 ENCOUNTER — Encounter: Payer: Self-pay | Admitting: Physical Therapy

## 2023-07-29 DIAGNOSIS — R2681 Unsteadiness on feet: Secondary | ICD-10-CM

## 2023-07-29 DIAGNOSIS — R2689 Other abnormalities of gait and mobility: Secondary | ICD-10-CM | POA: Diagnosis not present

## 2023-07-29 DIAGNOSIS — Z7409 Other reduced mobility: Secondary | ICD-10-CM | POA: Diagnosis not present

## 2023-07-29 DIAGNOSIS — M6281 Muscle weakness (generalized): Secondary | ICD-10-CM | POA: Diagnosis not present

## 2023-07-29 NOTE — Therapy (Addendum)
 OUTPATIENT PHYSICAL THERAPY PROSTHETIC TREATMENT & PROGRESS NOTE   Patient Name: Susan [name] MRN: [id number] DOB:1950-09-13, 73 y.o., female Today's Date: 07/29/2023  PCP: Donita Brooks, MD  REFERRING PROVIDER: Nadara Mustard, MD  Progress Note Reporting Period 06/17/2023 to 07/29/2023  See note below for Objective Data and Assessment of Progress/Goals.   END OF SESSION:  PT End of Session - 07/29/23 1347     Visit Number 30    Number of Visits 44    Date for PT Re-Evaluation 09/10/23    Authorization Type [employer]    Progress Note Due on Visit 30    PT Start Time 1345    PT Stop Time 1430    PT Time Calculation (min) 45 min    Equipment Utilized During Treatment Gait belt    Activity Tolerance Patient tolerated treatment well;Patient limited by fatigue    Behavior During Therapy WFL for tasks assessed/performed              Past Medical History:  Diagnosis Date   Anemia    hx   Anxiety    Arthritis    "generalized" (03/15/2014)   CAD (coronary artery disease)    MI in 2000 - MI  2007 - treated bare metal stent (no nuclear since then as 9/11)   Carotid artery disease (HCC)    Chronic diastolic heart failure (HCC)    a) ECHO (08/2013) EF 55-60% and RV function nl b) RHC (08/2013) RA 4, RV 30/5/7, PA 25/10 (16), PCWP 7, Fick CO/CI 6.3/2.7, PVR 1.5 WU, PA 61 and 66%   Daily headache    "~ every other day; since I fell in June" (03/15/2014)   Depression    Diabetic retinopathy (HCC)    Dyslipidemia    ESRD (end stage renal disease) (HCC)    Dialysis on Tues Thurs Sat   Exertional shortness of breath    GERD (gastroesophageal reflux disease)    History of blood transfusion    History of kidney stones    HTN (hypertension)    Hypothyroidism    Myocardial infarction (HCC)    Obesity    Osteoarthritis    PAF (paroxysmal atrial fibrillation) (HCC)    Peripheral neuropathy    bilateral feet/hands   PONV (postoperative nausea and vomiting)     RBBB (right bundle branch block)    Old   Stroke (HCC)    mini strokes   Type II diabetes mellitus (HCC)    Type II, [name]. patient has omnipod insulin pump with Novolin R Insulin   Past Surgical History:  Procedure Laterality Date   A/V FISTULAGRAM Left 11/07/2022   Procedure: A/V Fistulagram;  Surgeon: Leonie Douglas, MD;  [facility]: [facility];  Service: Cardiovascular;  Laterality: Left;   ABDOMINAL HYSTERECTOMY  1980's   AMPUTATION Right 02/24/2018   Procedure: RIGHT FOOT GREAT TOE AND 2ND TOE AMPUTATION;  Surgeon: Nadara Mustard, MD;  Location: [facility] OR;  Service: Orthopedics;  Laterality: Right;   AMPUTATION Right 04/30/2018   Procedure: RIGHT TRANSMETATARSAL AMPUTATION;  Surgeon: Nadara Mustard, MD;  Location: [facility] OR;  Service: Orthopedics;  Laterality: Right;   AMPUTATION Right 05/02/2022   Procedure: RIGHT BELOW KNEE AMPUTATION;  Surgeon: Nadara Mustard, MD;  Location: [facility] OR;  Service: Orthopedics;  Laterality: Right;   APPLICATION OF WOUND VAC Right 06/13/2022   Procedure: APPLICATION OF WOUND VAC;  Surgeon: Nadara Mustard, MD;  Location: [facility]  OR;  Service: Orthopedics;  Laterality: Right;   APPLICATION OF WOUND VAC Left 11/14/2022   Procedure: APPLICATION OF WOUND VAC;  Surgeon: Nadara Mustard, MD;  Location: [facility] OR;  Service: Orthopedics;  Laterality: Left;   AV FISTULA PLACEMENT Left 04/02/2022   Procedure: LEFT ARM ARTERIOVENOUS (AV) FISTULA CREATION;  Surgeon: Nada Libman, MD;  Location: [facility] OR;  Service: Vascular;  Laterality: Left;  PERIPHERAL NERVE BLOCK   AV FISTULA PLACEMENT Right 04/27/2023   Procedure: RIGHT ARM BRACHIOBASILIC ARTERIOVENOUS (AV) FISTULA CREATION;  Surgeon: Leonie Douglas, MD;  Location: [facility] OR;  Service: Vascular;  Laterality: Right;   BASCILIC VEIN TRANSPOSITION Left 07/31/2022   Procedure: LEFT ARM SECOND STAGE BASILIC VEIN TRANSPOSITION;  Surgeon: Nada Libman, MD;  [facility]: [facility] OR;  Service: Vascular;  Laterality: Left;    BIOPSY  05/27/2020   Procedure: BIOPSY;  Surgeon: Lanelle Bal, DO;  Location: [facility];  Service: Endoscopy;;   CATARACT EXTRACTION, BILATERAL Bilateral ?2013   COLONOSCOPY W/ POLYPECTOMY     COLONOSCOPY WITH PROPOFOL N/A 03/13/2019   Procedure: COLONOSCOPY WITH PROPOFOL;  Surgeon: Beverley Fiedler, MD;  Location: [facility] ENDOSCOPY;  Service: Gastroenterology;  Laterality: N/A;   CORONARY ANGIOPLASTY WITH STENT PLACEMENT  1999; 2007   "1 + 1"   ERCP N/A 02/03/2022   Procedure: ENDOSCOPIC RETROGRADE CHOLANGIOPANCREATOGRAPHY (ERCP);  Surgeon: Jeani Hawking, MD;  Location: [facility] ENDOSCOPY;  Service: Gastroenterology;  Laterality: N/A;   ESOPHAGOGASTRODUODENOSCOPY N/A 02/12/2023   Procedure: ESOPHAGOGASTRODUODENOSCOPY (EGD);  Surgeon: Jeani Hawking, MD;  Location: [facility] ENDOSCOPY;  Service: Gastroenterology;  Laterality: N/A;   ESOPHAGOGASTRODUODENOSCOPY (EGD) WITH PROPOFOL N/A 03/13/2019   Procedure: ESOPHAGOGASTRODUODENOSCOPY (EGD) WITH PROPOFOL;  Surgeon: Beverley Fiedler, MD;  Location: [facility] ENDOSCOPY;  Service: Gastroenterology;  Laterality: N/A;   ESOPHAGOGASTRODUODENOSCOPY (EGD) WITH PROPOFOL N/A 05/27/2020   Procedure: ESOPHAGOGASTRODUODENOSCOPY (EGD) WITH PROPOFOL;  Surgeon: Lanelle Bal, DO;  [facility];  Service: Endoscopy;  Laterality: N/A;   ESOPHAGOGASTRODUODENOSCOPY (EGD) WITH PROPOFOL N/A 09/03/2022   Procedure: ESOPHAGOGASTRODUODENOSCOPY (EGD) WITH PROPOFOL;  Surgeon: Jeani Hawking, MD;  Location: [facility] ENDOSCOPY;  Service: Gastroenterology;  Laterality: N/A;   EYE SURGERY Bilateral    lazer   FLEXIBLE SIGMOIDOSCOPY N/A 05/23/2022   Procedure: FLEXIBLE SIGMOIDOSCOPY;  Surgeon: Jeani Hawking, MD;  Location: [facility] ENDOSCOPY;  Service: Gastroenterology;  Laterality: N/A;   FLEXIBLE SIGMOIDOSCOPY N/A 05/24/2022   Procedure: FLEXIBLE SIGMOIDOSCOPY;  Surgeon: Imogene Burn, MD;  Location: [facility] ENDOSCOPY;  Service: Gastroenterology;  Laterality: N/A;   FLEXIBLE SIGMOIDOSCOPY N/A  09/03/2022   Procedure: FLEXIBLE SIGMOIDOSCOPY;  Surgeon: Jeani Hawking, MD;  Location: [facility] ENDOSCOPY;  Service: Gastroenterology;  Laterality: N/A;   HEMOSTASIS CLIP PLACEMENT  03/13/2019   Procedure: HEMOSTASIS CLIP PLACEMENT;  Surgeon: Beverley Fiedler, MD;  Location: [facility] ENDOSCOPY;  Service: Gastroenterology;;   HEMOSTASIS CLIP PLACEMENT  05/23/2022   Procedure: HEMOSTASIS CLIP PLACEMENT;  Surgeon: Jeani Hawking, MD;  Location[facility] ENDOSCOPY;  Service: Gastroenterology;;   HEMOSTASIS CONTROL  05/24/2022   Procedure: HEMOSTASIS CONTROL;  Surgeon: Imogene Burn, MD;  Location: [facility] ENDOSCOPY;  Service: Gastroenterology;;   HOT HEMOSTASIS N/A 05/23/2022   Procedure: HOT HEMOSTASIS (ARGON PLASMA COAGULATION/BICAP);  Surgeon: Jeani Hawking, MD;  Location: [facility] ENDOSCOPY;  Service: Gastroenterology;  Laterality: N/A;   I & D EXTREMITY Left 05/05/2022   Procedure: IRRIGATION AND DEBRIDEMENT LEFT ARM AV FISTULA;  Surgeon: Cephus Shelling, MD;  Location: [facility] OR;  Service: Vascular;  Laterality: Left;   I & D EXTREMITY  N/A 11/14/2022   Procedure: IRRIGATION AND DEBRIDEMENT OF LOWER EXTREMITY WOUND;  Surgeon: Nadara Mustard, MD;  Location: [facility] OR;  Service: Orthopedics;  Laterality: N/A;   INSERTION OF DIALYSIS CATHETER Right 04/02/2022   Procedure: INSERTION OF TUNNELED DIALYSIS CATHETER;  Surgeon: Nada Libman, MD;  Location: [facility] OR;  Service: Vascular;  Laterality: Right;   KNEE ARTHROSCOPY Left 10/25/2006   POLYPECTOMY  03/13/2019   Procedure: POLYPECTOMY;  Surgeon: Beverley Fiedler, MD;  Location[facility] ENDOS[facility];  Service: Gastroenterology;;   REMOVAL OF STONES  02/03/2022   Procedure: REMOVAL OF STONES;  Surgeon: Jeani Hawking, MD;  Location: [facility] ENDOSCOPY;  Service: Gastroenterology;;   REVISON OF ARTERIOVENOUS FISTULA Left 08/20/2022   Procedure: REVISON OF LEFT ARM ARTERIOVENOUS FISTULA;  Surgeon: Nada Libman, MD;  Location[facility] OR;  Service: Vascular;  Laterality: Left;   RIGHT HEART CATH N/A  07/24/2017   Procedure: RIGHT HEART CATH;  Surgeon: Dolores Patty, MD;  [facility];  Service: Cardiovascular;  Laterality: N/A;   RIGHT HEART CATHETERIZATION N/A 09/22/2013   Procedure: RIGHT HEART CATH;  Surgeon: Dolores Patty, MD;  Location: [facility] CATH LAB;  Service: Cardiovascular;  Laterality: N/A;   SHOULDER ARTHROSCOPY WITH OPEN ROTATOR CUFF REPAIR Right 03/14/2014   Procedure: RIGHT SHOULDER ARTHROSCOPY WITH BICEPS RELEASE, OPEN SUBSCAPULA REPAIR, OPEN SUPRASPINATUS REPAIR.;  Surgeon: Cammy Copa, MD;  Location: [facility] OR;  Service: Orthopedics;  Laterality: Right;   SPHINCTEROTOMY  02/03/2022   Procedure: SPHINCTEROTOMY;  Surgeon: Jeani Hawking, MD;  Location: [facility] ENDOSCOPY;  Service: Gastroenterology;;   STUMP REVISION Right 06/13/2022   Procedure: REVISION RIGHT BELOW KNEE AMPUTATION;  Surgeon: Nadara Mustard, MD;  Location: [facility] OR;  Service: Orthopedics;  Laterality: Right;   TEE WITHOUT CARDIOVERSION N/A 02/04/2022   Procedure: TRANSESOPHAGEAL ECHOCARDIOGRAM (TEE);  Surgeon: Dolores Patty, MD;  Location: [facility] ENDOSCOPY;  Service: Cardiovascular;  Laterality: N/A;   THROMBECTOMY W/ EMBOLECTOMY Left 08/20/2022   Procedure: THROMBECTOMY OF LEFT ARM ARTERIOVENOUS FISTULA;  Surgeon: Nada Libman, MD;  Location: [facility] OR;  Service: Vascular;  Laterality: Left;   TOE AMPUTATION Right 02/24/2018   GREAT TOE AND 2ND TOE AMPUTATION   TUBAL LIGATION  1970's   Patient Active Problem List   Diagnosis Date Noted   Atrial fibrillation (HCC) 06/17/2023   Atopic dermatitis 06/17/2023   Hyperosmolar hyperglycemic state (HHS) (HCC) 02/05/2023   Gastroparesis 02/05/2023   Depression 01/29/2023   Intractable nausea and vomiting 01/20/2023   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 difficile colitis 06/05/2022   Spleen hematoma 06/05/2022   Dehiscence of amputation stump of right lower extremity (HCC) 06/05/2022   Rectal ulcer 05/27/2022   ESRD (end stage renal disease) (HCC) 05/27/2022   GI bleed 05/23/2022   Difficult intravenous access 05/23/2022   Gangrene of right foot (HCC) 05/02/2022   S/P BKA (below knee amputation) unilateral, right (HCC) 05/02/2022   Unspecified protein-calorie malnutrition (HCC) 04/15/2022   Secondary hyperparathyroidism of renal origin (  HCC) 04/14/2022   Coagulation defect, unspecified (HCC) 04/09/2022   Acquired absence of other left toe(s) (HCC) 04/07/2022   Allergy, unspecified, initial encounter 04/07/2022   Dependence on renal dialysis (HCC) 04/07/2022   Gout due to renal impairment, unspecified site 04/07/2022   Hypertensive heart and chronic kidney disease with heart failure and with stage 5 chronic kidney disease, or end stage renal disease (HCC) 04/07/2022   Personal history of transient ischemic attack (TIA), and cerebral infarction without residual deficits 04/07/2022   Renal osteodystrophy 04/07/2022   Venous stasis ulcer of right calf (HCC) 03/31/2022   Fistula, colovaginal 03/26/2022   Diarrhea 03/26/2022   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   Sepsis without acute organ dysfunction (HCC)    Bacteremia    Acute pancreatitis 02/01/2022   Abdominal pain 02/01/2022   SIRS (systemic inflammatory response syndrome) (HCC) 02/01/2022   Transaminitis 02/01/2022   History of anemia due to chronic kidney disease 02/01/2022   Paroxysmal atrial fibrillation (HCC)  02/01/2022   Uncontrolled type 2 diabetes mellitus with hyperglycemia, with long-term current use of insulin (HCC) 01/14/2022   NSTEMI (non-ST elevated myocardial infarction) (HCC) 03/05/2021   Acute renal failure superimposed on stage 4 chronic kidney disease (HCC) 08/22/2020   Hypoalbuminemia 05/25/2020   GERD (gastroesophageal reflux disease) 05/25/2020   Pressure injury of skin 05/17/2020   Acute on chronic combined systolic and diastolic congestive heart failure (HCC) 03/07/2020   Type 2 diabetes mellitus with diabetic polyneuropathy, with long-term current use of insulin (HCC) 03/07/2020   Obesity, Class III, BMI 40-49.9 (morbid obesity) (HCC) 03/07/2020   Common bile duct (CBD) obstruction 05/28/2019   Benign neoplasm of ascending colon    Benign neoplasm of transverse colon    Benign neoplasm of descending colon    Benign neoplasm of sigmoid colon    Gastric polyps    Hyperkalemia 03/11/2019   Prolonged QT interval 03/11/2019   Acute blood loss anemia 03/11/2019   Onychomycosis 06/21/2018   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)    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 (HCC)    PVD (peripheral vascular disease) (HCC) 10/26/2017   E-coli UTI 07/27/2017   Hypothyroidism 07/27/2017   AKI (acute kidney injury) (HCC)    PAH (pulmonary artery hypertension) (HCC)    Impaired ambulation 07/19/2017   Leg cramps 02/27/2017   Peripheral edema 01/12/2017   Diabetic neuropathy (HCC) 11/12/2016   CKD (chronic kidney disease), stage IV (HCC) 10/24/2015   Anemia of chronic disease 10/03/2015   Historical diagnosis of generalized anxiety disorder 10/03/2015   Secondary insomnia 10/03/2015   Acute bronchitis 09/05/2015   Hyperglycemia due to diabetes mellitus (HCC) 06/07/2015   Non compliance with medical treatment 04/17/2014   Rotator cuff tear 03/14/2014   Obesity 09/23/2013   Chronic HFrEF (heart failure with reduced ejection fraction) (HCC) 06/03/2013    Hypotension 12/25/2012   Hyponatremia 12/25/2012   Urinary incontinence    MDD (major depressive disorder), recurrent episode, moderate (HCC) 11/12/2010   RBBB (right bundle branch block)    Wide-complex tachycardia    Coronary artery disease    Hyperlipemia 01/22/2009   Chronic hypotension 01/22/2009    ONSET DATE: 03/09/2023 MD referral to PT  REFERRING DIAG: S88.111D (ICD-10-CM) - Below-knee amputation of right lower extremity, subsequent encounter   THERAPY DIAG:  Unsteadiness on feet  Impaired functional mobility, balance, gait, and endurance  Muscle weakness (generalized)  Other abnormalities of gait and mobility  Right below-knee amputee (HCC)  Rationale for  Evaluation and Treatment: Rehabilitation  SUBJECTIVE:  SUBJECTIVE STATEMENT: Patient doing well today. She will be having a procedure later this week. Husband and patient agreeable to updating office on restrictions from the doctor.    PERTINENT HISTORY: Right TTA 05/02/22, Lymphedema, idiopathic chronic venous HTN with ulcer, depression, colitis, diverticulitis, ESRD, gout, NSTEMI, DM2, polyneuropathy, PVD, CAD, right bundle branch block, mini strokes  PAIN:  Are you having pain? No pain today.   PRECAUTIONS: Fall and Other: No BP LUE  WEIGHT BEARING RESTRICTIONS: No  FALLS: Has patient fallen in last 6 months? No  LIVING ENVIRONMENT: Lives with: lives with their spouse and 2 small dogs Lives in: mobile home Home Access: Ramped entrance Home layout: One level Stairs: Yes: External: 6-7 steps; can reach both Has following equipment at home: Single point cane, Environmental consultant - 2 wheeled, Environmental consultant - 4 wheeled, Wheelchair (manual), [employer], Grab bars, and Ramped entry  OCCUPATION:  retired  PLOF: prior to amputation for ~year used RW in home & community  PATIENT GOALS:  to use prosthesis to walk, get out w/c in & out of car, get to bathroom.   OBJECTIVE:  COGNITION: Overall cognitive status:  Eval on  03/23/2023:   Within functional limits for tasks assessed  POSTURE:  Eval on 03/23/2023:  rounded shoulders, forward head, increased thoracic kyphosis, flexed trunk , and weight shift left  LOWER EXTREMITY ROM: ROM Right eval Left eval  Hip flexion    Hip extension    Hip abduction    Hip adduction    Hip internal rotation    Hip external rotation    Knee flexion    Knee extension    Ankle dorsiflexion    Ankle plantarflexion    Ankle inversion    Ankle eversion     (Blank rows = not tested)  LOWER EXTREMITY MMT:  MMT Right eval Left eval  Hip flexion 3-/5 3-/5  Hip extension 2/5 2/5  Hip abduction 2+/5 2+/5  Hip adduction    Hip internal rotation    Hip external rotation    Knee flexion 3-/5 3-/5  Knee extension 3-/5 3-/5  Ankle dorsiflexion    Ankle plantarflexion    Ankle inversion    Ankle eversion    At Evaluation all strength testing is grossly seated and functionally standing / gait. (Blank rows = not tested)  TRANSFERS: 07/27/2023: -Sit>stand from wc with RW: MinA with heavy vc for sequencing and offweighting LE -Stand>sit wc with RW: CGA with patient performing safe controlled descent  06/15/2023: -Sit to stand transfers with RW wheelchair<>mat table at varied height: 19in with maxA to rise to stand; 22in ModA rise to stand, 24in min-modA rise to stand. -stand pivot transfer with RW w/c to mat table with modA once standing.  -Lateral scooting wc<>table at level height SBA; vc for LE positioning for optimal use of LE during transfer -lateral scoot R: modA due to fatigue and uneven surface transfer from low surface to higher surface   05/04/2023: Squat pivot transfers with modA.  04/21/2023: Pt sit to stand w/c to //bar with one hand pushing on w/c and other hand on //bar with modA first time & MinA 2nd/ 3rd time.  Best is RUE on //bar & LUE on w/c.  Stand to sit reaching LUE to w/c with minA to control descent.   PT demo & verbal cues on sit to/from  stand w/c to RW.  1st time modA 2 person. 2nd time maxA one person.  Stand  to sit with minA and constant cueing. Scooting transfers with minA with w/c & mat direct contact & same ht.   Eval on 03/23/2023:  Sit to stand: Max A (two-person assist) from 22" w/c to rolling walker Stand to sit: Max A (two-person assist) rolling walker to wheelchair  FUNCTIONAL TESTs:   07/27/2023: Patient stood with RW SBA for 54sec before c/o lightheadedness and sitting down to toilet. Resisted nudges applied posteriorly to simulate hygienic cleaning. VC for weight shifts when LE become tired.  BP: after standing 51min54sec 161/82 HR 92  06/15/2023: -Static stance for standing with RW; requiring Mod-MaxA for rise to stand d/t patient unsteady on feet posterior lean without proper righting strategy, SBA for stand   04/21/2023: Pt able to stand 60 sec with BUE on //bars with supervision.  Pt able to stand for 1 min with RW support with minA.  Eval on 03/23/2023: Patient maintained upright holding rolling walker with mod assist 2 people for safety for 30 seconds.  GAIT: 07/27/2023: Patient ambulated 30ft with narrow bathroom entry and 90deg turn to sit on commode CGA with minA and heavy vc for progression and sequencing of RW. While turning to sit on commode patient lost balance laterally requiring mod-maxA from therapist and patient use of grab bar to regain balance.   06/17/2023: Patient ambulated 31ft (maximal tolerated distance) with RW ModA (2 person for safety). Required VC for LE sequencing and minA to steer walker and prevent tip over.   04/21/2023: Pt amb 5' in //bars with modA (2 person for safety) with PT verbal & tactile cues on sequence and weight shift.  Pt amb 10' with RW with maxA (2 person for safety) with PT verbal & tactile cues on sequence and weight shift.  Eval on 03/23/2023:  Patient took 2 steps (one per LE) with +2 max assist with rolling walker and TTA prosthesis.  Patient  adducting prosthesis stepping on left foot with no awareness.  CURRENT PROSTHETIC WEAR ASSESSMENT: 06/17/2023:  Independence with prosthetic care:  Patient and husband are independent with: skin check, prosthetic cleaning, ply sock cleaning, proper wear schedule/adjustment Patient is dependent with: residual limb care, correct ply sock adjustment, and proper weight-bearing schedule/adjustment Donning prosthesis: husband is independent with proper donning. Patient requires MaxA. Patient can verbally direct someone how to properly assist. Doffing prosthesis: Husband is independent with proper donning. Patient requires modA. Patient can verbally direct someone how to properly assist. Prosthetic wear tolerance: majority of awake hours including dialysis hours, 1x/day, 3-4 days/week Prosthetic weight bearing tolerance: able to tolerate of standing without complains of prosthetic limb pain. Time not limited by limb discomfort but limited by muscle fatigue.   Eval on 03/23/2023:   Patient is dependent with: skin check, residual limb care, prosthetic cleaning, ply sock cleaning, correct ply sock adjustment, proper wear schedule/adjustment, and proper weight-bearing schedule/adjustment Donning prosthesis: Max A Doffing prosthesis: Min A Prosthetic wear tolerance: 2-6 hours, 1x/day, 3-4 days/week Prosthetic weight bearing tolerance: 3 minutes with limb pain Edema: pitting edema Residual limb condition: No open areas, dry skin, normal color and temperature, cylindrical shape Prosthetic description: Silicone liner with pin lock suspension, total contact socket with flexible inner socket, sach foot    TODAY'S TREATMENT:  DATE: 07/29/2023 Prosthetic training: Patient ambulated 41ft with use of RW CGA with instances of close SBA, with 2 180deg turns to sit in chair. Turns required  mod-maxA with heavy cuing to prevent fall posteriorly. On second turn patient required therapist intervention to prevent fall. Patient required maxAx2 to rise from low chair without armrest however was able to lower into chair with use of RW with MinA. Patient able to rise to stand with RW and chair with armrest with Mod-minA with vc for cues to weight shift and offload LE in order to progress rise to standing.  Theract: Seated trunk flexion/extension, L lateral and R lateral x5ea with vc for use of core and back strength, then use of 5lb weight in all directions Seated trunk rotation R and L with green tband resistance x5ea  Patient educated on performing core and back strengthening activities in order to help with balance and strength when walking upright in walker and preventing falls. Verbalized understanding.  TREATMENT:                                                                                                                            DATE: 07/27/2023 -Patient ambulated 51ft with narrow bathroom entry and 90deg turn to sit on commode CGA with minA and heavy vc for progression and sequencing of RW. While turning to sit on commode patient lost balance laterally requiring mod-maxA from therapist and patient use of grab bar to regain balance.  -Stand>sit 18in toilet: modA due to low height and only 1 grab bar -Sit>stand 18in toilet: Mod-maxA with use of grab bar, wheelchair armrest (simulate 1 of armrests on raised toilet seat), and RW.  -Patient stood with RW SBA for 54sec before c/o lightheadedness and sitting down to toilet. Resisted nudges applied posteriorly to simulate hygienic cleaning. VC for weight shifts when LE become tired.  BP: after standing 22min54sec 161/82 HR 92 -Sit>stand from wc with RW: MinA with heavy vc for sequencing and offweighting LE -Stand>sit wc with RW: CGA with patient performing safe controlled descent -Patient ambulated 65ft CGA to wheelchair and required  MaxA when turning due to posterior LOB requiring therapist assist to prevent fall    TREATMENT:                                                                                                                             DATE: 07/22/2023  Self care: Patient  demonstrated don/doff of prosthetic leg and recalled key points for proper don/doffing. Required verbal cuing and hand over hand placement for instruction on liner rolling and ensuring socks were not obstructing the pin. Increased time needed for activity. Set up assist for liner donning.  Educated on modified liner don/doffing with patient demonstrating understanding with verbal cuing.  Educated on using the mirror that she uses to check the bottoms of her feet to also check to see if the socks are not caught on the back of the pin. PT recommended pt to try to don prosthesis with husband's supervision on days that she is not going to dialysis.  Pt & husband verbalized understanding.    Prosthetic training: standing balance with RW- simulation for hygienic cleaning; posterior and lateral perturbations applied once patient was standing. STS Mod-maxA with heavy vc for weight shifting to off-weight LE and to keep weight forward to prevent posterior collapse back to chair. PT demo & verbal cues on weight shifting to increase standing tolerance but needs to communicate with person assisting prior to doing.  Pt verbalized understanding.  Trial 1: 40sec Trail 2: 14sec    TREATMENT:                                                                                                                             DATE: 07/20/2023  Self care: Patient and husband educated on DVT risk, signs and symptoms, and urgency for doppler to rule out DVT. Patient and husband verbalized understanding.   Prosthetic training: Patient able to instruct on process on don/doffing prosthetic leg with set up assist and cuing with patient able to get to correct answer.  Required education on how to verbalize line up of socket to ensure proper alignment. Verbal cues and demo used with patient able to verbalize and teach back.    TREATMENT:                                                                                                                             DATE: 07/08/2023  Therapeutic Activities: Patient ambulated 51' with 92* turn to orient to path and 90* to position in front of w/c with RW modA during turns and MinA straight path. walked from outside of bathroom to toilet to sit to build patient confidence in ability at home in the future. PT cues on sequencing and weight shift especially during turns.  Sit  to Stand from wheelchair required modA to prevent posterior LOB. Use of railing to sit and stand from toilet requiring mod-maxA to rise due to low surface without grab bar on L side.  Static standing to work on ability to maintain balance while husband assists at home in future. Patient able to tolerate standing with RW support with supervision for 3 min to allow time for simulation of hygienic cleaning. Required long duration seated rest break on toilet after stand with patient demonstrating seated balance unsupported however with fatigue patient did rest trunk / back on commode.  Patient able to ambulate 32' with RW back out to wheelchair minA/CGA straight path and requiring min-modA for turning to sit in chair.    Self care: Patient and husband educated on use of BSC for over the toilet as it would raise the height and has armrests for patient to use for ease of sit/stand transfer. Also educated on using stool under feet while seated on toilet to aid with her ability for BM.  Patient and husband instructed to figure out height of toilet at home and [facility] for safe practice in clinic.     PATIENT EDUCATION: PATIENT EDUCATED ON FOLLOWING PROSTHETIC CARE: Education details: Use of stockinette under proximal liner to decrease itching sensation, no wounds  presents to PT recommended not using Vive wear under liner Skin check, Prosthetic cleaning, Propper donning, and Proper wear schedule/adjustment Prosthetic wear tolerance: 3 hours 2x/day, 4 days/week Person educated: Patient and Spouse Education method: Explanation, Tactile cues, and Verbal cues Education comprehension: verbalized understanding, verbal cues required, tactile cues required, and needs further education  HOME EXERCISE PROGRAM: Access Code: [email or web address] URL: [email or web address] Date: 04/15/2023 Prepared by: Vladimir Faster  Exercises - Supine Bridge  - 1 x daily - 4 x weekly - 2 sets - 5 reps - 2 seconds hold - Supine Lower Trunk Rotation  - 1 x daily - 4 x weekly - 2 sets - 5 reps - 5 seconds hold - Supine Heel Slide with Strap  - 1 x daily - 4 x weekly - 2 sets - 5 reps - 2-3 seconds hold - Supine Hip Abduction  - 1 x daily - 4 x weekly - 2 sets - 5 reps - 2-3 seconds hold - Seated Hip Flexion Toward Target  - 1 x daily - 4 x weekly - 2 sets - 5 reps - 5 seconds hold - Seated Eccentric Abdominal Lean Back  - 1 x daily - 4 x weekly - 2 sets - 5 reps - 5 seconds hold - Seated Sidebending  - 1 x daily - 4 x weekly - 2 sets - 5 reps - 5 seconds hold - Seated Trunk Rotation with Crossed Arms  - 1 x daily - 4 x weekly - 2 sets - 5 reps - 5 seconds hold   ASSESSMENT:  CLINICAL IMPRESSION:  Patient continues to make gains towards goals and independence. Was able to decrease assist levels during ambulation however continues to have significant balance and coordination deficits which hinder turning with RW. Patient will benefit from continued skilled physical therapy to address deficits and to increase patient independence level while lowering caregiver burden.     OBJECTIVE IMPAIRMENTS: Abnormal gait, decreased activity tolerance, decreased balance, decreased endurance, decreased knowledge of condition, decreased knowledge of use of DME, decreased mobility, difficulty  walking, decreased ROM, decreased strength, decreased safety awareness, postural dysfunction, prosthetic dependency , and pain.   ACTIVITY LIMITATIONS: standing, transfers, and locomotion level  PARTICIPATION  LIMITATIONS: community activity, household mobility and dependency / burden of care on family  PERSONAL FACTORS: Age, Fitness, Past/current experiences, Time since onset of injury/illness/exacerbation, and 3+ comorbidities: see PMH  are also affecting patient's functional outcome.   REHAB POTENTIAL: Good  CLINICAL DECISION MAKING: Evolving/moderate complexity  EVALUATION COMPLEXITY: Moderate   GOALS: Goals reviewed with patient? Yes  SHORT TERM GOALS: Target date: 07/15/2023:  Patient & husband verbalize proper residual limb care and understanding for adjusting ply socks.  Baseline: SEE OBJECTIVE DATA Goal status:   MET 07/27/2023 2.  Patient able to sit to stand w/c to rolling walker with 1 person modA and maintain upright with RW for 4 min with SBA to assist with toileting needs/hygienic cleaning.  Baseline: SEE OBJECTIVE DATA Goal status: partially met 07/08/2023  3. Patient ambulates 79ft including turning 90* to position to sit with RW & prosthesis with modA to navigate reaching bathroom in home.  Baseline: SEE OBJECTIVE DATA Goal status: partially met 07/08/2023   LONG TERM GOALS: Target date: 09/10/2023  Patient & husband demonstrates & verbalizes understanding of prosthetic care to enable safe utilization of prosthesis. Baseline: SEE OBJECTIVE DATA Goal status: ongoing 07/27/2023  Patient tolerates prosthesis wear >80% of awake hours on non-dialysis days and >50% on dialysis days without skin issues and limb pain </= 4/10. Baseline: SEE OBJECTIVE DATA Goal status: MET 06/15/2023  Scooting transfers with minA and sit to/from stand transfers with modA.  Baseline: SEE OBJECTIVE DATA Goal status: ongoing  07/27/2023  Patient ambulates >78ft including turning 90* to  position to sit with prosthesis & RW with modA for household navigation.  Baseline: SEE OBJECTIVE DATA Goal status: ongoing  07/27/2023  Patient able to maintain static stance with RW support with SBA for 5 min to assist with hygienic cleaning.  Baseline: SEE OBJECTIVE DATA Goal status:  ongoing  07/27/2023   PLAN:  PT FREQUENCY: 2x/week  PT DURATION: 12 weeks  PLANNED INTERVENTIONS: 97164- PT Re-evaluation, 97110-Therapeutic exercises, 97530- Therapeutic activity, 97112- Neuromuscular re-education, [phone]- Self Care, 21308- Gait training, [phone]- Prosthetic training, Patient/Family education, Balance training, DME instructions, Therapeutic exercises, Therapeutic activity, Neuromuscular re-education, Gait training, and Self Care  PLAN FOR NEXT SESSION:  check MD note for any restrictions following vascular procedure on 3/7.  continue turning/positioning with less assist from therapist- ensure correct sequencing of wt shifts and moving walker. STS with balancing and posterior to anterior nudges.     Lincoln Kleiner, Fendi Meinhardt, Student-PT 07/29/2023, 2:33 PM  This entire session of physical therapy was performed under the direct supervision of PT signing evaluation /treatment. PT reviewed note and agrees.   Vladimir Faster, PT, DPT 07/29/2023, 4:14 PM

## 2023-07-31 ENCOUNTER — Ambulatory Visit (HOSPITAL_COMMUNITY)
Admission: RE | Admit: 2023-07-31 | Discharge: 2023-07-31 | Disposition: A | Discharge status: Home / Self Care | Payer: PPO | Source: Ambulatory Visit | Attending: Vascular Surgery | Admitting: Vascular Surgery

## 2023-07-31 ENCOUNTER — Encounter (HOSPITAL_COMMUNITY)
Admission: RE | Disposition: A | Discharge status: Home / Self Care | Payer: Self-pay | Source: Ambulatory Visit | Attending: Vascular Surgery

## 2023-07-31 ENCOUNTER — Other Ambulatory Visit: Payer: Self-pay

## 2023-07-31 DIAGNOSIS — Z7901 Long term (current) use of anticoagulants: Secondary | ICD-10-CM | POA: Diagnosis not present

## 2023-07-31 DIAGNOSIS — Z992 Dependence on renal dialysis: Secondary | ICD-10-CM | POA: Insufficient documentation

## 2023-07-31 DIAGNOSIS — N185 Chronic kidney disease, stage 5: Secondary | ICD-10-CM

## 2023-07-31 DIAGNOSIS — N186 End stage renal disease: Secondary | ICD-10-CM | POA: Insufficient documentation

## 2023-07-31 HISTORY — PX: UPPER EXTREMITY VENOGRAPHY: CATH118272

## 2023-07-31 LAB — POCT I-STAT, CHEM 8
BUN: 19 mg/dL (ref 8–23)
Calcium, Ion: 1.11 mmol/L — ABNORMAL LOW (ref 1.15–1.40)
Chloride: 94 mmol/L — ABNORMAL LOW (ref 98–111)
Creatinine, Ser: 3.5 mg/dL — ABNORMAL HIGH (ref 0.44–1.00)
Glucose, Bld: 183 mg/dL — ABNORMAL HIGH (ref 70–99)
HCT: 35 % — ABNORMAL LOW (ref 36.0–46.0)
Hemoglobin: 11.9 g/dL — ABNORMAL LOW (ref 12.0–15.0)
Potassium: 3.1 mmol/L — ABNORMAL LOW (ref 3.5–5.1)
Sodium: 137 mmol/L (ref 135–145)
TCO2: 32 mmol/L (ref 22–32)

## 2023-07-31 SURGERY — UPPER EXTREMITY VENOGRAPHY
Anesthesia: LOCAL

## 2023-07-31 MED ORDER — IODIXANOL 320 MG/ML IV SOLN
INTRAVENOUS | Status: DC | PRN
Start: 2023-07-31 — End: 2023-07-31
  Administered 2023-07-31: 40 mL via INTRAVENOUS

## 2023-07-31 MED ORDER — SODIUM CHLORIDE 0.9% FLUSH: 3.0000 mL | Freq: Two times a day (BID) | INTRAVENOUS | Status: DC

## 2023-07-31 MED ORDER — HYDRALAZINE HCL 20 MG/ML IJ SOLN: 5.0000 mg | INTRAMUSCULAR | Status: DC | PRN

## 2023-07-31 MED ORDER — SODIUM CHLORIDE 0.9 % IV SOLN: 250.0000 mL | INTRAVENOUS | Status: DC | PRN

## 2023-07-31 MED ORDER — SODIUM CHLORIDE 0.9% FLUSH: 3.0000 mL | INTRAVENOUS | Status: DC | PRN

## 2023-07-31 MED ORDER — HEPARIN (PORCINE) IN NACL 1000-0.9 UT/500ML-% IV SOLN
INTRAVENOUS | Status: DC | PRN
  Administered 2023-07-31: 250 mL

## 2023-07-31 MED ORDER — LABETALOL HCL 5 MG/ML IV SOLN: 10.0000 mg | INTRAVENOUS | Status: DC | PRN

## 2023-07-31 MED ORDER — ACETAMINOPHEN 325 MG PO TABS: 650.0000 mg | ORAL_TABLET | ORAL | Status: DC | PRN

## 2023-07-31 SURGICAL SUPPLY — 4 items
KIT PV (KITS) ×1 IMPLANT
SYR MEDRAD MARK 7 150ML (SYRINGE) ×1 IMPLANT
TRANSDUCER W/STOPCOCK (MISCELLANEOUS) ×1 IMPLANT
TRAY PV CATH (CUSTOM PROCEDURE TRAY) ×1 IMPLANT

## 2023-07-31 NOTE — Discharge Instructions (Signed)
Activity Rest as told by your health care provider. Return to your normal activities as told by your health care provider. Ask your health care provider what activities are safe for you. May shower and remove bandage 24 hours after discharge. If you were given a sedative during the procedure, it can affect you for several hours. Do not drive or operate machinery until your health care provider says that it is safe. General instructions      Check your IV insertion area every day for signs of infection. Check for: Redness, swelling, or pain. Fluid or blood. Warmth. Pus or a bad smell. Take over-the-counter and prescription medicines only as told by your health care provider. Contact a health care provider if: You have redness, swelling, warmth, pus, or pain around the IV insertion site.

## 2023-07-31 NOTE — Op Note (Signed)
 DATE OF SERVICE: 07/31/2023  PATIENT:  Susan [name]  73 y.o. female  PRE-OPERATIVE DIAGNOSIS: End-stage renal disease, multiple failed dialysis access surgeries.  POST-OPERATIVE DIAGNOSIS:  Same  PROCEDURE:   Bilateral upper extremity venogram  SURGEON:  Surgeons and Role:    * Leonie Douglas, MD - Primary  ASSISTANT: none  ANESTHESIA:   none  EBL: 0  BLOOD ADMINISTERED:none  DRAINS: none   LOCAL MEDICATIONS USED:  NONE  SPECIMEN:  none  COUNTS: confirmed correct.  TOURNIQUET:  none  PATIENT DISPOSITION:  PACU - hemodynamically stable.   Delay start of Pharmacological VTE agent (>24hrs) due to surgical blood loss or risk of bleeding: no  INDICATION FOR PROCEDURE: Susan [name] is a 73 y.o. female with incisional disease, dialyzing through tunneled dialysis catheter.  Patient is undergone multiple upper extremity arteriovenous fistulas by me.  These have thrombosed.  To see if she is a candidate for further upper extremity access, we offered her venography to ensure no central occlusion. The patient understood and wished to proceed.  OPERATIVE FINDINGS: Axillary veins patent bilaterally.  Subclavian veins patent bilaterally.  Innominate veins patent bilaterally.  Difficult to visualize superior vena cava, but it does appear patent.  There is a right IJ tunneled dialysis catheter which does not appear to be causing significant stenosis.  DESCRIPTION OF PROCEDURE: After identification of the patient in the pre-operative holding area, the patient was transferred to the Cath Lab. The patient was positioned supine on the operating room table. The bilateral arms were accessed with peripheral IVs.  Venography was performed in stations in the bilateral arms.  See above for detail.  After obtaining satisfactory radiographs, the patient was transferred off of the Cath Lab table and the IVs discontinued.    Upon completion of the case instrument and sharps counts were confirmed  correct. The patient was transferred to the PACU in good condition. I was present for all portions of the procedure.  FOLLOW UP PLAN: Patient needs better blood pressure control during dialysis.  Follow-up with me in 1 month to discuss graft and to review blood pressure control.  Rande Brunt. Lenell Antu, MD [facility] Phone Number: [phone] 07/31/2023 12:28 AM

## 2023-08-02 ENCOUNTER — Other Ambulatory Visit: Payer: Self-pay | Admitting: Family Medicine

## 2023-08-02 ENCOUNTER — Encounter (HOSPITAL_COMMUNITY): Payer: Self-pay | Admitting: Vascular Surgery

## 2023-08-03 ENCOUNTER — Ambulatory Visit: Payer: PPO | Admitting: Physical Therapy

## 2023-08-03 ENCOUNTER — Telehealth: Payer: Self-pay

## 2023-08-03 ENCOUNTER — Telehealth: Payer: Self-pay | Admitting: Vascular Surgery

## 2023-08-03 ENCOUNTER — Encounter: Payer: Self-pay | Admitting: Physical Therapy

## 2023-08-03 DIAGNOSIS — Z7409 Other reduced mobility: Secondary | ICD-10-CM | POA: Diagnosis not present

## 2023-08-03 DIAGNOSIS — R2681 Unsteadiness on feet: Secondary | ICD-10-CM

## 2023-08-03 DIAGNOSIS — R2689 Other abnormalities of gait and mobility: Secondary | ICD-10-CM | POA: Diagnosis not present

## 2023-08-03 DIAGNOSIS — Z89511 Acquired absence of right leg below knee: Secondary | ICD-10-CM

## 2023-08-03 DIAGNOSIS — M6281 Muscle weakness (generalized): Secondary | ICD-10-CM | POA: Diagnosis not present

## 2023-08-03 NOTE — H&P (Signed)
    Postoperative Access Visit   History of Present Illness   Susan [name] is a 73 y.o. year old female who presents for postoperative follow-up for: right brachiobasilic AV fistula creation by Dr. Lenell Antu on 04/27/23.  The patient's wounds are well healed.  The patient notes no steal symptoms.  She has a prior failed left basilic vein fistula.  Currently dialyzing via right internal jugular TDC on TTS at the [place] [facility]  Physical Examination   Vitals:   07/31/23 0605 07/31/23 0806  BP: 137/64 (!) 154/65  Pulse: 69 78  Resp: 16   Temp: 97.8 F (36.6 C)   TempSrc: Oral   SpO2: 94% 96%  Weight: 100.2 kg   Height: 5\' 5"  (1.651 m)    Body mass index is 36.78 kg/m.  right arm Incision is well healed, 2+ radial pulse, hand grip is 5/5, sensation in digits is intact, no palpable thrill, bruit cannot be auscultated    Non invasive vascular lab:  Findings:  +--------------------+----------+-----------------+--------+  AVF                PSV (cm/s)Flow Vol (mL/min)Comments  +--------------------+----------+-----------------+--------+  Native artery inflow   103            7                  +--------------------+----------+-----------------+--------+  AVF Anastomosis         15                               +--------------------+----------+-----------------+--------+  Summary:  Diminished outflow . No color flow noted at the anastamosis. No palpable thrill consistent with occlusion      Medical Decision Making   Susan [name] is a 73 y.o. year old female who presents s/p  right brachiobasilic AV fistula creation by Dr. Lenell Antu on 04/27/23. Her incision is well healed. No signs or symptoms of steal. 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 Before placement of AV graft would recommend bilateral upper extremity venogram to define any central venous issues  She  is on Eliquis Will arrange BUE Venogram in the near future on a non dialysis day. She has requested those to be scheduled on a Friday if possible   Leonie Douglas, PA-C [facility] [place] Office: [phone]  On call MD: Hetty Blend

## 2023-08-03 NOTE — Therapy (Addendum)
 OUTPATIENT PHYSICAL THERAPY PROSTHETIC TREATMENT    Patient Name: Susan [name] MRN: [id number] DOB:[birth year], 73 y.o., female Today's Date: 08/03/2023  PCP: Donita Brooks, MD  REFERRING PROVIDER: Nadara Mustard, MD   END OF SESSION:  PT End of Session - 08/03/23 1337     Visit Number 31    Number of Visits 44    Date for PT Re-Evaluation 09/10/23    Authorization Type [employer]    Progress Note Due on Visit 40    PT Start Time 1345    PT Stop Time 1430    PT Time Calculation (min) 45 min    Equipment Utilized During Treatment Gait belt    Activity Tolerance Patient tolerated treatment well;Patient limited by fatigue    Behavior During Therapy WFL for tasks assessed/performed               Past Medical History:  Diagnosis Date   Anemia    hx   Anxiety    Arthritis    "generalized" (03/15/2014)   CAD (coronary artery disease)    MI in 2000 - MI  2007 - treated bare metal stent (no nuclear since then as 9/11)   Carotid artery disease (HCC)    Chronic diastolic heart failure (HCC)    a) ECHO (08/2013) EF 55-60% and RV function nl b) RHC (08/2013) RA 4, RV 30/5/7, PA 25/10 (16), PCWP 7, Fick CO/CI 6.3/2.7, PVR 1.5 WU, PA 61 and 66%   Daily headache    "~ every other day; since I fell in June" (03/15/2014)   Depression    Diabetic retinopathy (HCC)    Dyslipidemia    ESRD (end stage renal disease) (HCC)    Dialysis on Tues Thurs Sat   Exertional shortness of breath    GERD (gastroesophageal reflux disease)    History of blood transfusion    History of kidney stones    HTN (hypertension)    Hypothyroidism    Myocardial infarction (HCC)    Obesity    Osteoarthritis    PAF (paroxysmal atrial fibrillation) (HCC)    Peripheral neuropathy    bilateral feet/hands   PONV (postoperative nausea and vomiting)    RBBB (right bundle branch block)    Old   Stroke (HCC)    mini strokes   Type II diabetes mellitus (HCC)    Type II, [name]  left upper arm. patient has omnipod insulin pump with Novolin R Insulin   Past Surgical History:  Procedure Laterality Date   A/V FISTULAGRAM Left 11/07/2022   Procedure: A/V Fistulagram;  Surgeon: Leonie Douglas, MD;  [facility];  Service: Cardiovascular;  Laterality: Left;   ABDOMINAL HYSTERECTOMY  1980's   AMPUTATION Right 02/24/2018   Procedure: RIGHT FOOT GREAT TOE AND 2ND TOE AMPUTATION;  Surgeon: Nadara Mustard, MD;  Location: [facility] OR;  Service: Orthopedics;  Laterality: Right;   AMPUTATION Right 04/30/2018   Procedure: RIGHT TRANSMETATARSAL AMPUTATION;  Surgeon: Nadara Mustard, MD;  Location: [name] OR;  Service: Orthopedics;  Laterality: Right;   AMPUTATION Right 05/02/2022   Procedure: RIGHT BELOW KNEE AMPUTATION;  Surgeon: Nadara Mustard, MD;  Location: [facility] OR;  Service: Orthopedics;  Laterality: Right;   APPLICATION OF WOUND VAC Right 06/13/2022   Procedure: APPLICATION OF WOUND VAC;  Surgeon: Nadara Mustard, MD;  Location: [facility] OR;  Service: Orthopedics;  Laterality: Right;   APPLICATION OF WOUND VAC Left 11/14/2022   Procedure: APPLICATION OF  WOUND VAC;  Surgeon: Nadara Mustard, MD;  Location: [facility] OR;  Service: Orthopedics;  Laterality: Left;   AV FISTULA PLACEMENT Left 04/02/2022   Procedure: LEFT ARM ARTERIOVENOUS (AV) FISTULA CREATION;  Surgeon: Nada Libman, MD;  [facility]: [facility];  Service: Vascular;  Laterality: Left;  PERIPHERAL NERVE BLOCK   AV FISTULA PLACEMENT Right 04/27/2023   Procedure: RIGHT ARM BRACHIOBASILIC ARTERIOVENOUS (AV) FISTULA CREATION;  Surgeon: Leonie Douglas, MD;  [facility]: [facility];  Service: Vascular;  Laterality: Right;   BASCILIC VEIN TRANSPOSITION Left 07/31/2022   Procedure: LEFT ARM SECOND STAGE BASILIC VEIN TRANSPOSITION;  Surgeon: Nada Libman, MD;  [facility]: [facility];  Service: Vascular;  Laterality: Left;   BIOPSY  05/27/2020   Procedure: BIOPSY;  Surgeon: Lanelle Bal, DO;  Location: [facility];  Service: Endoscopy;;   CATARACT  EXTRACTION, BILATERAL Bilateral ?2013   COLONOSCOPY W/ POLYPECTOMY     COLONOSCOPY WITH PROPOFOL N/A 03/13/2019   Procedure: COLONOSCOPY WITH PROPOFOL;  Surgeon: Beverley Fiedler, MD;  Location: [facility] ENDOSCOPY;  Service: Gastroenterology;  Laterality: N/A;   CORONARY ANGIOPLASTY WITH STENT PLACEMENT  1999; 2007   "1 + 1"   ERCP N/A 02/03/2022   Procedure: ENDOSCOPIC RETROGRADE CHOLANGIOPANCREATOGRAPHY (ERCP);  Surgeon: Jeani Hawking, MD;  Location: [facility] ENDOSCOPY;  Service: Gastroenterology;  Laterality: N/A;   ESOPHAGOGASTRODUODENOSCOPY N/A 02/12/2023   Procedure: ESOPHAGOGASTRODUODENOSCOPY (EGD);  Surgeon: Jeani Hawking, MD;  Location: [facility] ENDOSCOPY;  Service: Gastroenterology;  Laterality: N/A;   ESOPHAGOGASTRODUODENOSCOPY (EGD) WITH PROPOFOL N/A 03/13/2019   Procedure: ESOPHAGOGASTRODUODENOSCOPY (EGD) WITH PROPOFOL;  Surgeon: Beverley Fiedler, MD;  Location: [facility] ENDOSCOPY;  Service: Gastroenterology;  Laterality: N/A;   ESOPHAGOGASTRODUODENOSCOPY (EGD) WITH PROPOFOL N/A 05/27/2020   Procedure: ESOPHAGOGASTRODUODENOSCOPY (EGD) WITH PROPOFOL;  Surgeon: Lanelle Bal, DO;  [facility];  Service: Endoscopy;  Laterality: N/A;   ESOPHAGOGASTRODUODENOSCOPY (EGD) WITH PROPOFOL N/A 09/03/2022   Procedure: ESOPHAGOGASTRODUODENOSCOPY (EGD) WITH PROPOFOL;  Surgeon: Jeani Hawking, MD;  Location: [facility] ENDOSCOPY;  Service: Gastroenterology;  Laterality: N/A;   EYE SURGERY Bilateral    lazer   FLEXIBLE SIGMOIDOSCOPY N/A 05/23/2022   Procedure: FLEXIBLE SIGMOIDOSCOPY;  Surgeon: Jeani Hawking, MD;  Location: [facility] ENDOSCOPY;  Service: Gastroenterology;  Laterality: N/A;   FLEXIBLE SIGMOIDOSCOPY N/A 05/24/2022   Procedure: FLEXIBLE SIGMOIDOSCOPY;  Surgeon: Imogene Burn, MD;  Location: [facility] ENDOSCOPY;  Service: Gastroenterology;  Laterality: N/A;   FLEXIBLE SIGMOIDOSCOPY N/A 09/03/2022   Procedure: FLEXIBLE SIGMOIDOSCOPY;  Surgeon: Jeani Hawking, MD;  Location: [facility] ENDOSCOPY;  Service: Gastroenterology;  Laterality:  N/A;   HEMOSTASIS CLIP PLACEMENT  03/13/2019   Procedure: HEMOSTASIS CLIP PLACEMENT;  Surgeon: Beverley Fiedler, MD;  Location: [facility] ENDOSCOPY;  Service: Gastroenterology;;   HEMOSTASIS CLIP PLACEMENT  05/23/2022   Procedure: HEMOSTASIS CLIP PLACEMENT;  Surgeon: Jeani Hawking, MD;  Location: [facility] ENDOSCOPY;  Service: Gastroenterology;;   HEMOSTASIS CONTROL  05/24/2022   Procedure: HEMOSTASIS CONTROL;  Surgeon: Imogene Burn, MD;  Location: [facility] ENDOSCOPY;  Service: Gastroenterology;;   HOT HEMOSTASIS N/A 05/23/2022   Procedure: HOT HEMOSTASIS (ARGON PLASMA COAGULATION/BICAP);  Surgeon: Jeani Hawking, MD;  Location: [facility] ENDOSCOPY;  Service: Gastroenterology;  Laterality: N/A;   I & D EXTREMITY Left 05/05/2022   Procedure: IRRIGATION AND DEBRIDEMENT LEFT ARM AV FISTULA;  Surgeon: Cephus Shelling, MD;  Location: [facility] OR;  Service: Vascular;  Laterality: Left;   I & D EXTREMITY N/A 11/14/2022   Procedure: IRRIGATION AND DEBRIDEMENT OF LOWER EXTREMITY WOUND;  Surgeon: Nadara Mustard, MD;  [facility] OR;  Service: Orthopedics;  Laterality: N/A;   INSERTION OF DIALYSIS CATHETER Right 04/02/2022   Procedure: INSERTION OF TUNNELED DIALYSIS CATHETER;  Surgeon: Nada Libman, MD;  Location: [facility] OR;  Service: Vascular;  Laterality: Right;   KNEE ARTHROSCOPY Left 10/25/2006   POLYPECTOMY  03/13/2019   Procedure: POLYPECTOMY;  Surgeon: Beverley Fiedler, MD;  Location: [facility] ENDOS[facility];  Service: Gastroenterology;;   REMOVAL OF STONES  02/03/2022   Procedure: REMOVAL OF STONES;  Surgeon: Jeani Hawking, MD;  Location: [facility] ENDOSCOPY;  Service: Gastroenterology;;   REVISON OF ARTERIOVENOUS FISTULA Left 08/20/2022   Procedure: REVISON OF LEFT ARM ARTERIOVENOUS FISTULA;  Surgeon: Nada Libman, MD;  [facility] OR;  Service: Vascular;  Laterality: Left;   RIGHT HEART CATH N/A 07/24/2017   Procedure: RIGHT HEART CATH;  Surgeon: Dolores Patty, MD;  [facility];  Service: Cardiovascular;  Laterality: N/A;    RIGHT HEART CATHETERIZATION N/A 09/22/2013   Procedure: RIGHT HEART CATH;  Surgeon: Dolores Patty, MD;  Location: [facility] CATH LAB;  Service: Cardiovascular;  Laterality: N/A;   SHOULDER ARTHROSCOPY WITH OPEN ROTATOR CUFF REPAIR Right 03/14/2014   Procedure: RIGHT SHOULDER ARTHROSCOPY WITH BICEPS RELEASE, OPEN SUBSCAPULA REPAIR, OPEN SUPRASPINATUS REPAIR.;  Surgeon: Cammy Copa, MD;  Location: [facility] OR;  Service: Orthopedics;  Laterality: Right;   SPHINCTEROTOMY  02/03/2022   Procedure: SPHINCTEROTOMY;  Surgeon: Jeani Hawking, MD;  Location: [facility] ENDOSCOPY;  Service: Gastroenterology;;   STUMP REVISION Right 06/13/2022   Procedure: REVISION RIGHT BELOW KNEE AMPUTATION;  Surgeon: Nadara Mustard, MD;  Location: [facility] OR;  Service: Orthopedics;  Laterality: Right;   TEE WITHOUT CARDIOVERSION N/A 02/04/2022   Procedure: TRANSESOPHAGEAL ECHOCARDIOGRAM (TEE);  Surgeon: Dolores Patty, MD;  Location: [facility] ENDOSCOPY;  Service: Cardiovascular;  Laterality: N/A;   THROMBECTOMY W/ EMBOLECTOMY Left 08/20/2022   Procedure: THROMBECTOMY OF LEFT ARM ARTERIOVENOUS FISTULA;  Surgeon: Nada Libman, MD;  Location: [facility] OR;  Service: Vascular;  Laterality: Left;   TOE AMPUTATION Right 02/24/2018   GREAT TOE AND 2ND TOE AMPUTATION   TUBAL LIGATION  1970's   UPPER EXTREMITY VENOGRAPHY N/A 07/31/2023   Procedure: UPPER EXTREMITY VENOGRAPHY;  Surgeon: Leonie Douglas, MD;  [facility];  Service: Cardiovascular;  Laterality: N/A;   Patient Active Problem List   Diagnosis Date Noted   Atrial fibrillation (HCC) 06/17/2023   Atopic dermatitis 06/17/2023   Hyperosmolar hyperglycemic state (HHS) (HCC) 02/05/2023   Gastroparesis 02/05/2023   Depression 01/29/2023   Intractable nausea and vomiting 01/20/2023   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 difficile colitis 06/05/2022   Spleen hematoma 06/05/2022   Dehiscence of amputation stump of right lower extremity (HCC) 06/05/2022   Rectal ulcer 05/27/2022   ESRD (end stage renal disease) (HCC) 05/27/2022   GI bleed 05/23/2022   Difficult intravenous access 05/23/2022   Gangrene of right foot (HCC) 05/02/2022   S/P BKA (below knee amputation) unilateral, right (HCC) 05/02/2022   Unspecified  protein-calorie malnutrition (HCC) 04/15/2022   Secondary hyperparathyroidism of renal origin (HCC) 04/14/2022   Coagulation defect, unspecified (HCC) 04/09/2022   Acquired absence of other left toe(s) (HCC) 04/07/2022   Allergy, unspecified, initial encounter 04/07/2022   Dependence on renal dialysis (HCC) 04/07/2022   Gout due to renal impairment, unspecified site 04/07/2022   Hypertensive heart and chronic kidney disease with heart failure and with stage 5 chronic kidney disease, or end stage renal disease (HCC) 04/07/2022   Personal history of transient ischemic attack (TIA), and cerebral infarction without residual deficits 04/07/2022   Renal osteodystrophy 04/07/2022   Venous stasis ulcer of right calf (HCC) 03/31/2022   Fistula, colovaginal 03/26/2022   Diarrhea 03/26/2022   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   Sepsis without acute organ dysfunction (HCC)    Bacteremia    Acute pancreatitis 02/01/2022   Abdominal pain 02/01/2022   SIRS (systemic inflammatory response syndrome) (HCC) 02/01/2022   Transaminitis 02/01/2022   History of anemia due to chronic kidney disease  02/01/2022   Paroxysmal atrial fibrillation (HCC) 02/01/2022   Uncontrolled type 2 diabetes mellitus with hyperglycemia, with long-term current use of insulin (HCC) 01/14/2022   NSTEMI (non-ST elevated myocardial infarction) (HCC) 03/05/2021   Acute renal failure superimposed on stage 4 chronic kidney disease (HCC) 08/22/2020   Hypoalbuminemia 05/25/2020   GERD (gastroesophageal reflux disease) 05/25/2020   Pressure injury of skin 05/17/2020   Acute on chronic combined systolic and diastolic congestive heart failure (HCC) 03/07/2020   Type 2 diabetes mellitus with diabetic polyneuropathy, with long-term current use of insulin (HCC) 03/07/2020   Obesity, Class III, BMI 40-49.9 (morbid obesity) (HCC) 03/07/2020   Common bile duct (CBD) obstruction 05/28/2019   Benign neoplasm of ascending colon    Benign neoplasm of transverse colon    Benign neoplasm of descending colon    Benign neoplasm of sigmoid colon    Gastric polyps    Hyperkalemia 03/11/2019   Prolonged QT interval 03/11/2019   Acute blood loss anemia 03/11/2019   Onychomycosis 06/21/2018   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)    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 (HCC)    PVD (peripheral vascular disease) (HCC) 10/26/2017   E-coli UTI 07/27/2017   Hypothyroidism 07/27/2017   AKI (acute kidney injury) (HCC)    PAH (pulmonary artery hypertension) (HCC)    Impaired ambulation 07/19/2017   Leg cramps 02/27/2017   Peripheral edema 01/12/2017   Diabetic neuropathy (HCC) 11/12/2016   CKD (chronic kidney disease), stage IV (HCC) 10/24/2015   Anemia of chronic disease 10/03/2015   Historical diagnosis of generalized anxiety disorder 10/03/2015   Secondary insomnia 10/03/2015   Acute bronchitis 09/05/2015   Hyperglycemia due to diabetes mellitus (HCC) 06/07/2015   Non compliance with medical treatment 04/17/2014   Rotator cuff tear 03/14/2014   Obesity 09/23/2013   Chronic HFrEF (heart failure  with reduced ejection fraction) (HCC) 06/03/2013   Hypotension 12/25/2012   Hyponatremia 12/25/2012   Urinary incontinence    MDD (major depressive disorder), recurrent episode, moderate (HCC) 11/12/2010   RBBB (right bundle branch block)    Wide-complex tachycardia    Coronary artery disease    Hyperlipemia 01/22/2009   Chronic hypotension 01/22/2009    ONSET DATE: 03/09/2023 MD referral to PT  REFERRING DIAG: S88.111D (ICD-10-CM) - Below-knee amputation of right lower extremity, subsequent encounter   THERAPY DIAG:  Unsteadiness on feet  Impaired functional mobility, balance, gait, and endurance  Muscle weakness (generalized)  Other abnormalities of  gait and mobility  Right below-knee amputee [facility])  Rationale for Evaluation and Treatment: Rehabilitation  SUBJECTIVE:  SUBJECTIVE STATEMENT: Patient states that she is doing ok today. Vein specialist said that she is ok to work with physical therapy without restrictions.  States her residual limb is very itchy when she takes off the liner.    PERTINENT HISTORY: Right TTA 05/02/22, Lymphedema, idiopathic chronic venous HTN with ulcer, depression, colitis, diverticulitis, ESRD, gout, NSTEMI, DM2, polyneuropathy, PVD, CAD, right bundle branch block, mini strokes  PAIN:  Are you having pain? No pain today.   PRECAUTIONS: Fall and Other: No BP LUE  WEIGHT BEARING RESTRICTIONS: No  FALLS: Has patient fallen in last 6 months? No  LIVING ENVIRONMENT: Lives with: lives with their spouse and 2 small dogs Lives in: mobile home Home Access: Ramped entrance Home layout: One level Stairs: Yes: External: 6-7 steps; can reach both Has following equipment at home: Single point cane, Environmental consultant - 2 wheeled, Environmental consultant - 4 wheeled, Wheelchair (manual), [employer], Grab bars, and Ramped entry  OCCUPATION:  retired  PLOF: prior to amputation for ~year used RW in home & community  PATIENT GOALS:  to use prosthesis to walk, get out w/c in &  out of car, get to bathroom.   OBJECTIVE:  COGNITION: Overall cognitive status:  Eval on 03/23/2023:   Within functional limits for tasks assessed  POSTURE:  Eval on 03/23/2023:  rounded shoulders, forward head, increased thoracic kyphosis, flexed trunk , and weight shift left  LOWER EXTREMITY ROM: ROM Right eval Left eval  Hip flexion    Hip extension    Hip abduction    Hip adduction    Hip internal rotation    Hip external rotation    Knee flexion    Knee extension    Ankle dorsiflexion    Ankle plantarflexion    Ankle inversion    Ankle eversion     (Blank rows = not tested)  LOWER EXTREMITY MMT:  MMT Right eval Left eval  Hip flexion 3-/5 3-/5  Hip extension 2/5 2/5  Hip abduction 2+/5 2+/5  Hip adduction    Hip internal rotation    Hip external rotation    Knee flexion 3-/5 3-/5  Knee extension 3-/5 3-/5  Ankle dorsiflexion    Ankle plantarflexion    Ankle inversion    Ankle eversion    At Evaluation all strength testing is grossly seated and functionally standing / gait. (Blank rows = not tested)  TRANSFERS: 07/27/2023: -Sit>stand from wc with RW: MinA with heavy vc for sequencing and offweighting LE -Stand>sit wc with RW: CGA with patient performing safe controlled descent  06/15/2023: -Sit to stand transfers with RW wheelchair<>mat table at varied height: 19in with maxA to rise to stand; 22in ModA rise to stand, 24in min-modA rise to stand. -stand pivot transfer with RW w/c to mat table with modA once standing.  -Lateral scooting wc<>table at level height SBA; vc for LE positioning for optimal use of LE during transfer -lateral scoot R: modA due to fatigue and uneven surface transfer from low surface to higher surface   05/04/2023: Squat pivot transfers with modA.  04/21/2023: Pt sit to stand w/c to //bar with one hand pushing on w/c and other hand on //bar with modA first time & MinA 2nd/ 3rd time.  Best is RUE on //bar & LUE on w/c.  Stand to sit  reaching LUE to w/c with minA to control descent.   PT demo &  verbal cues on sit to/from stand w/c to RW.  1st time modA 2 person. 2nd time maxA one person.  Stand to sit with minA and constant cueing. Scooting transfers with minA with w/c & mat direct contact & same ht.   Eval on 03/23/2023:  Sit to stand: Max A (two-person assist) from 22" w/c to rolling walker Stand to sit: Max A (two-person assist) rolling walker to wheelchair  FUNCTIONAL TESTs:   07/27/2023: Patient stood with RW SBA for 54sec before c/o lightheadedness and sitting down to toilet. Resisted nudges applied posteriorly to simulate hygienic cleaning. VC for weight shifts when LE become tired.  BP: after standing 34min54sec 161/82 HR 92  06/15/2023: -Static stance for standing with RW; requiring Mod-MaxA for rise to stand d/t patient unsteady on feet posterior lean without proper righting strategy, SBA for stand   04/21/2023: Pt able to stand 60 sec with BUE on //bars with supervision.  Pt able to stand for 1 min with RW support with minA.  Eval on 03/23/2023: Patient maintained upright holding rolling walker with mod assist 2 people for safety for 30 seconds.  GAIT: 07/27/2023: Patient ambulated 7ft with narrow bathroom entry and 90deg turn to sit on commode CGA with minA and heavy vc for progression and sequencing of RW. While turning to sit on commode patient lost balance laterally requiring mod-maxA from therapist and patient use of grab bar to regain balance.   06/17/2023: Patient ambulated 52ft (maximal tolerated distance) with RW ModA (2 person for safety). Required VC for LE sequencing and minA to steer walker and prevent tip over.   04/21/2023: Pt amb 5' in //bars with modA (2 person for safety) with PT verbal & tactile cues on sequence and weight shift.  Pt amb 10' with RW with maxA (2 person for safety) with PT verbal & tactile cues on sequence and weight shift.  Eval on 03/23/2023:  Patient took 2  steps (one per LE) with +2 max assist with rolling walker and TTA prosthesis.  Patient adducting prosthesis stepping on left foot with no awareness.  CURRENT PROSTHETIC WEAR ASSESSMENT: 06/17/2023:  Independence with prosthetic care:  Patient and husband are independent with: skin check, prosthetic cleaning, ply sock cleaning, proper wear schedule/adjustment Patient is dependent with: residual limb care, correct ply sock adjustment, and proper weight-bearing schedule/adjustment Donning prosthesis: husband is independent with proper donning. Patient requires MaxA. Patient can verbally direct someone how to properly assist. Doffing prosthesis: Husband is independent with proper donning. Patient requires modA. Patient can verbally direct someone how to properly assist. Prosthetic wear tolerance: majority of awake hours including dialysis hours, 1x/day, 3-4 days/week Prosthetic weight bearing tolerance: able to tolerate of standing without complains of prosthetic limb pain. Time not limited by limb discomfort but limited by muscle fatigue.   Eval on 03/23/2023:   Patient is dependent with: skin check, residual limb care, prosthetic cleaning, ply sock cleaning, correct ply sock adjustment, proper wear schedule/adjustment, and proper weight-bearing schedule/adjustment Donning prosthesis: Max A Doffing prosthesis: Min A Prosthetic wear tolerance: 2-6 hours, 1x/day, 3-4 days/week Prosthetic weight bearing tolerance: 3 minutes with limb pain Edema: pitting edema Residual limb condition: No open areas, dry skin, normal color and temperature, cylindrical shape Prosthetic description: Silicone liner with pin lock suspension, total contact socket with flexible inner socket, sach foot    TODAY'S TREATMENT:  DATE: 08/03/2023 Prosthetic training: -Patient ambulated 28ft around 1  obstacle + 22ft weaving between 2 obstacles to 90deg turn to sit in chair with use of RW CGA with intermittent close SBA, 1 instance of L lateral LOB with patient able to rectify under own power without therapist intervention- R foot crossed and stepped on L foot. With fatigue patient required CGA throughout as well as increased time for motor planning and sequencing.  -Patients prosthesis became twisted to the R, patient asked to rectify requiring verbal cuing and leading questions for patient to properly verbalize sequencing and putting on of prosthetic leg. Able to press pin release independently with 2 attempts, verbalized key points for lining up leg correctly before securing into socket.  -patient and husband encouraged to continue to practice patient don/doff leg, ensuring clean liner with application of cortizone to sooth itchiness, as well as to continue HEP for core and back strengthening.  Self care:  -STSx3 with LE walk back; 1st bout ModA with heavy vc for sequencing 2nd and 3rd bout ModA without vc as patient was able to sequence LE without cuing.  -Stood with RW accepting posterior pertubation's applied at the hip to simulate hygenic cleaning; 1st bout 44min15sec 2nd bout 51min3sec.     TREATMENT:                                                                                                                            DATE: 07/29/2023 Prosthetic training: Patient ambulated 91ft with use of RW CGA with instances of close SBA, with 2 180deg turns to sit in chair. Turns required mod-maxA with heavy cuing to prevent fall posteriorly. On second turn patient required therapist intervention to prevent fall. Patient required maxAx2 to rise from low chair without armrest however was able to lower into chair with use of RW with MinA. Patient able to rise to stand with RW and chair with armrest with Mod-minA with vc for cues to weight shift and offload LE in order to progress rise to standing. VC for  sequencing of feet and remaining close to seated surface on approach to reduce amount of effort to take steps bwds  Theract: Seated trunk flexion/extension, L lateral and R lateral x5ea with vc for use of core and back strength, then use of 5lb weight in all directions Seated trunk rotation R and L with green tband resistance x5ea  Patient educated on performing core and back strengthening activities in order to help with balance and strength when walking upright in walker and preventing falls. Verbalized understanding.  TREATMENT:  DATE: 07/27/2023 -Patient ambulated 21ft with narrow bathroom entry and 90deg turn to sit on commode CGA with minA and heavy vc for progression and sequencing of RW. While turning to sit on commode patient lost balance laterally requiring mod-maxA from therapist and patient use of grab bar to regain balance.  -Stand>sit 18in toilet: modA due to low height and only 1 grab bar -Sit>stand 18in toilet: Mod-maxA with use of grab bar, wheelchair armrest (simulate 1 of armrests on raised toilet seat), and RW.  -Patient stood with RW SBA for 54sec before c/o lightheadedness and sitting down to toilet. Resisted nudges applied posteriorly to simulate hygienic cleaning. VC for weight shifts when LE become tired.  BP: after standing 56min54sec 161/82 HR 92 -Sit>stand from wc with RW: MinA with heavy vc for sequencing and offweighting LE -Stand>sit wc with RW: CGA with patient performing safe controlled descent -Patient ambulated 35ft CGA to wheelchair and required MaxA when turning due to posterior LOB requiring therapist assist to prevent fall    TREATMENT:                                                                                                                             DATE: 07/22/2023  Self care: Patient demonstrated don/doff of prosthetic leg  and recalled key points for proper don/doffing. Required verbal cuing and hand over hand placement for instruction on liner rolling and ensuring socks were not obstructing the pin. Increased time needed for activity. Set up assist for liner donning.  Educated on modified liner don/doffing with patient demonstrating understanding with verbal cuing.  Educated on using the mirror that she uses to check the bottoms of her feet to also check to see if the socks are not caught on the back of the pin. PT recommended pt to try to don prosthesis with husband's supervision on days that she is not going to dialysis.  Pt & husband verbalized understanding.    Prosthetic training: standing balance with RW- simulation for hygienic cleaning; posterior and lateral perturbations applied once patient was standing. STS Mod-maxA with heavy vc for weight shifting to off-weight LE and to keep weight forward to prevent posterior collapse back to chair. PT demo & verbal cues on weight shifting to increase standing tolerance but needs to communicate with person assisting prior to doing.  Pt verbalized understanding.  Trial 1: 40sec Trail 2: 14sec     PATIENT EDUCATION: PATIENT EDUCATED ON FOLLOWING PROSTHETIC CARE: Education details: Use of stockinette under proximal liner to decrease itching sensation, no wounds presents to PT recommended not using Vive wear under liner Skin check, Prosthetic cleaning, Propper donning, and Proper wear schedule/adjustment Prosthetic wear tolerance: 3 hours 2x/day, 4 days/week Person educated: Patient and Spouse Education method: Explanation, Tactile cues, and Verbal cues Education comprehension: verbalized understanding, verbal cues required, tactile cues required, and needs further education  HOME EXERCISE PROGRAM: Access Code: [email or web address] URL: [email or web address] Date: 04/15/2023 Prepared by: Vladimir Faster  Exercises - Supine Bridge  - 1 x daily - 4 x  weekly - 2 sets - 5 reps - 2 seconds hold - Supine Lower Trunk Rotation  - 1 x daily - 4 x weekly - 2 sets - 5 reps - 5 seconds hold - Supine Heel Slide with Strap  - 1 x daily - 4 x weekly - 2 sets - 5 reps - 2-3 seconds hold - Supine Hip Abduction  - 1 x daily - 4 x weekly - 2 sets - 5 reps - 2-3 seconds hold - Seated Hip Flexion Toward Target  - 1 x daily - 4 x weekly - 2 sets - 5 reps - 5 seconds hold - Seated Eccentric Abdominal Lean Back  - 1 x daily - 4 x weekly - 2 sets - 5 reps - 5 seconds hold - Seated Sidebending  - 1 x daily - 4 x weekly - 2 sets - 5 reps - 5 seconds hold - Seated Trunk Rotation with Crossed Arms  - 1 x daily - 4 x weekly - 2 sets - 5 reps - 5 seconds hold   ASSESSMENT:  CLINICAL IMPRESSION:  Patient demonstrated increased balance with turning and sequencing as patient was able to turn 90 deg to sit in chair without LOB. Continues to require VC for applying force into walker to prevent lateral or posterior LOB. Patient was able to maintain upright resisting perturbations for increased time vs last session. Patient continues to make progress towards goals and will benefit from continued skilled physical therapy.   OBJECTIVE IMPAIRMENTS: Abnormal gait, decreased activity tolerance, decreased balance, decreased endurance, decreased knowledge of condition, decreased knowledge of use of DME, decreased mobility, difficulty walking, decreased ROM, decreased strength, decreased safety awareness, postural dysfunction, prosthetic dependency , and pain.   ACTIVITY LIMITATIONS: standing, transfers, and locomotion level  PARTICIPATION LIMITATIONS: community activity, household mobility and dependency / burden of care on family  PERSONAL FACTORS: Age, Fitness, Past/current experiences, Time since onset of injury/illness/exacerbation, and 3+ comorbidities: see PMH  are also affecting patient's functional outcome.   REHAB POTENTIAL: Good  CLINICAL DECISION MAKING:  Evolving/moderate complexity  EVALUATION COMPLEXITY: Moderate   GOALS: Goals reviewed with patient? Yes  SHORT TERM GOALS: Target date: 07/15/2023:  Patient & husband verbalize proper residual limb care and understanding for adjusting ply socks.  Baseline: SEE OBJECTIVE DATA Goal status:   MET 07/27/2023 2.  Patient able to sit to stand w/c to rolling walker with 1 person modA and maintain upright with RW for 4 min with SBA to assist with toileting needs/hygienic cleaning.  Baseline: SEE OBJECTIVE DATA Goal status: partially met 07/08/2023  3. Patient ambulates 97ft including turning 90* to position to sit with RW & prosthesis with modA to navigate reaching bathroom in home.  Baseline: SEE OBJECTIVE DATA Goal status: partially met 07/08/2023   LONG TERM GOALS: Target date: 09/10/2023  Patient & husband demonstrates & verbalizes understanding of prosthetic care to enable safe utilization of prosthesis. Baseline: SEE OBJECTIVE DATA Goal status: ongoing 07/27/2023  Patient tolerates prosthesis wear >80% of awake hours on non-dialysis days and >50% on dialysis days without skin issues and limb pain </= 4/10. Baseline: SEE OBJECTIVE DATA Goal status: MET 06/15/2023  Scooting transfers with minA and sit to/from stand transfers with modA.  Baseline: SEE OBJECTIVE DATA Goal status: ongoing  07/27/2023  Patient ambulates >43ft including turning 90* to position to sit with prosthesis & RW with modA for household navigation.  Baseline: SEE  OBJECTIVE DATA Goal status: ongoing  07/27/2023  Patient able to maintain static stance with RW support with SBA for 5 min to assist with hygienic cleaning.  Baseline: SEE OBJECTIVE DATA Goal status:  ongoing  07/27/2023   PLAN:  PT FREQUENCY: 2x/week  PT DURATION: 12 weeks  PLANNED INTERVENTIONS: 97164- PT Re-evaluation, 97110-Therapeutic exercises, 97530- Therapeutic activity, 97112- Neuromuscular re-education, 97535- Self Care, 16109- Gait training,  [phone]- Prosthetic training, Patient/Family education, Balance training, DME instructions, Therapeutic exercises, Therapeutic activity, Neuromuscular re-education, Gait training, and Self Care  PLAN FOR NEXT SESSION:  Continue turning/positioning with less assist and cuing from therapist, increase standing endurance with cuing for weight shifts to offload BLE, seated core strengthening with resistance bands.     Kareem Cathey, Kilah Drahos, Student-PT 08/03/2023, 2:34 PM  This entire session of physical therapy was performed under the direct supervision of PT signing evaluation /treatment. PT reviewed note and agrees.   Vladimir Faster, PT, DPT 08/03/2023, 3:36 PM

## 2023-08-03 NOTE — Telephone Encounter (Signed)
 Requested medication (s) are due for refill today: Yes  Requested medication (s) are on the active medication list: Yes  Last refill:  06/05/23  Future visit scheduled: Yes  Notes to clinic:  Unable to refill per protocol, cannot delegate.      Requested Prescriptions  Pending Prescriptions Disp Refills   LORazepam (ATIVAN) 0.5 MG tablet [Pharmacy Med Name: LORAZEPAM 0.5 MG TABLET] 30 tablet 2    Sig: TAKE 1 TABLET BY MOUTH AT BEDTIME.     Not Delegated - Psychiatry: Anxiolytics/Hypnotics 2 Failed - 08/03/2023  5:12 PM      Failed - This refill cannot be delegated      Failed - Urine Drug Screen completed in last 360 days      Failed - Valid encounter within last 6 months    Recent Outpatient Visits           1 year ago Cellulitis of lower extremity, unspecified laterality   [facility] Donita Brooks, MD   1 year ago Diarrhea, unspecified type   [facility] Donita Brooks, MD   2 years ago Dysuria   [facility] Donita Brooks, MD   2 years ago Stage 4 chronic kidney disease (HCC)   [facility] Donita Brooks, MD   2 years ago Chronic diastolic CHF (congestive heart failure) (HCC)   [facility] [name], Priscille Heidelberg, MD       Future Appointments             In 3 months [facility] Noralyn Pick, MD [facility]            Passed - Patient is not pregnant

## 2023-08-03 NOTE — Telephone Encounter (Signed)
 Patient left a message for triage needing more information re: restrictions after her recent venogram.  Wanted to know if she could resume PT and does she need to resume her blood thinner.  No restrictions after venogram for upper extremities.  She may continue performing with PT.  Yes, please resume blood thinner as normal.

## 2023-08-04 DIAGNOSIS — N2581 Secondary hyperparathyroidism of renal origin: Secondary | ICD-10-CM | POA: Diagnosis not present

## 2023-08-04 DIAGNOSIS — N186 End stage renal disease: Secondary | ICD-10-CM | POA: Diagnosis not present

## 2023-08-04 DIAGNOSIS — D689 Coagulation defect, unspecified: Secondary | ICD-10-CM | POA: Diagnosis not present

## 2023-08-04 DIAGNOSIS — Z992 Dependence on renal dialysis: Secondary | ICD-10-CM | POA: Diagnosis not present

## 2023-08-05 ENCOUNTER — Other Ambulatory Visit: Payer: Self-pay

## 2023-08-05 ENCOUNTER — Inpatient Hospital Stay (HOSPITAL_COMMUNITY)
Admission: EM | Admit: 2023-08-05 | Discharge: 2023-08-13 | DRG: 871 | Disposition: A | Discharge status: Home / Self Care | Attending: Internal Medicine | Admitting: Internal Medicine

## 2023-08-05 ENCOUNTER — Encounter (HOSPITAL_COMMUNITY): Payer: Self-pay

## 2023-08-05 ENCOUNTER — Ambulatory Visit: Payer: PPO | Admitting: Physical Therapy

## 2023-08-05 ENCOUNTER — Emergency Department (HOSPITAL_COMMUNITY)

## 2023-08-05 ENCOUNTER — Encounter: Payer: Self-pay | Admitting: Physical Therapy

## 2023-08-05 DIAGNOSIS — R1030 Lower abdominal pain, unspecified: Principal | ICD-10-CM

## 2023-08-05 DIAGNOSIS — R0602 Shortness of breath: Secondary | ICD-10-CM | POA: Diagnosis not present

## 2023-08-05 DIAGNOSIS — B9620 Unspecified Escherichia coli [E. coli] as the cause of diseases classified elsewhere: Secondary | ICD-10-CM | POA: Diagnosis present

## 2023-08-05 DIAGNOSIS — E039 Hypothyroidism, unspecified: Secondary | ICD-10-CM | POA: Diagnosis present

## 2023-08-05 DIAGNOSIS — Z89511 Acquired absence of right leg below knee: Secondary | ICD-10-CM

## 2023-08-05 DIAGNOSIS — Z79899 Other long term (current) drug therapy: Secondary | ICD-10-CM

## 2023-08-05 DIAGNOSIS — F331 Major depressive disorder, recurrent, moderate: Secondary | ICD-10-CM | POA: Diagnosis present

## 2023-08-05 DIAGNOSIS — E1142 Type 2 diabetes mellitus with diabetic polyneuropathy: Secondary | ICD-10-CM

## 2023-08-05 DIAGNOSIS — Z7901 Long term (current) use of anticoagulants: Secondary | ICD-10-CM

## 2023-08-05 DIAGNOSIS — I5023 Acute on chronic systolic (congestive) heart failure: Secondary | ICD-10-CM | POA: Diagnosis present

## 2023-08-05 DIAGNOSIS — D631 Anemia in chronic kidney disease: Secondary | ICD-10-CM | POA: Diagnosis present

## 2023-08-05 DIAGNOSIS — N390 Urinary tract infection, site not specified: Principal | ICD-10-CM | POA: Diagnosis present

## 2023-08-05 DIAGNOSIS — R2681 Unsteadiness on feet: Secondary | ICD-10-CM

## 2023-08-05 DIAGNOSIS — A419 Sepsis, unspecified organism: Secondary | ICD-10-CM | POA: Diagnosis not present

## 2023-08-05 DIAGNOSIS — Z1612 Extended spectrum beta lactamase (ESBL) resistance: Secondary | ICD-10-CM | POA: Diagnosis present

## 2023-08-05 DIAGNOSIS — M6281 Muscle weakness (generalized): Secondary | ICD-10-CM

## 2023-08-05 DIAGNOSIS — E1122 Type 2 diabetes mellitus with diabetic chronic kidney disease: Secondary | ICD-10-CM | POA: Diagnosis present

## 2023-08-05 DIAGNOSIS — I953 Hypotension of hemodialysis: Secondary | ICD-10-CM | POA: Diagnosis present

## 2023-08-05 DIAGNOSIS — Z794 Long term (current) use of insulin: Secondary | ICD-10-CM

## 2023-08-05 DIAGNOSIS — Z881 Allergy status to other antibiotic agents status: Secondary | ICD-10-CM

## 2023-08-05 DIAGNOSIS — A4151 Sepsis due to Escherichia coli [E. coli]: Principal | ICD-10-CM | POA: Diagnosis present

## 2023-08-05 DIAGNOSIS — R590 Localized enlarged lymph nodes: Secondary | ICD-10-CM | POA: Diagnosis not present

## 2023-08-05 DIAGNOSIS — R2689 Other abnormalities of gait and mobility: Secondary | ICD-10-CM

## 2023-08-05 DIAGNOSIS — Z992 Dependence on renal dialysis: Secondary | ICD-10-CM

## 2023-08-05 DIAGNOSIS — R531 Weakness: Secondary | ICD-10-CM

## 2023-08-05 DIAGNOSIS — F419 Anxiety disorder, unspecified: Secondary | ICD-10-CM | POA: Diagnosis present

## 2023-08-05 DIAGNOSIS — J9811 Atelectasis: Secondary | ICD-10-CM | POA: Diagnosis not present

## 2023-08-05 DIAGNOSIS — R918 Other nonspecific abnormal finding of lung field: Secondary | ICD-10-CM | POA: Diagnosis not present

## 2023-08-05 DIAGNOSIS — N186 End stage renal disease: Secondary | ICD-10-CM | POA: Diagnosis not present

## 2023-08-05 DIAGNOSIS — D638 Anemia in other chronic diseases classified elsewhere: Secondary | ICD-10-CM | POA: Diagnosis present

## 2023-08-05 DIAGNOSIS — I2510 Atherosclerotic heart disease of native coronary artery without angina pectoris: Secondary | ICD-10-CM | POA: Diagnosis present

## 2023-08-05 DIAGNOSIS — E861 Hypovolemia: Secondary | ICD-10-CM | POA: Diagnosis present

## 2023-08-05 DIAGNOSIS — T82828A Fibrosis of vascular prosthetic devices, implants and grafts, initial encounter: Secondary | ICD-10-CM | POA: Diagnosis not present

## 2023-08-05 DIAGNOSIS — Z79890 Hormone replacement therapy (postmenopausal): Secondary | ICD-10-CM

## 2023-08-05 DIAGNOSIS — Z87891 Personal history of nicotine dependence: Secondary | ICD-10-CM

## 2023-08-05 DIAGNOSIS — J811 Chronic pulmonary edema: Secondary | ICD-10-CM | POA: Diagnosis not present

## 2023-08-05 DIAGNOSIS — N1330 Unspecified hydronephrosis: Secondary | ICD-10-CM | POA: Diagnosis not present

## 2023-08-05 DIAGNOSIS — Z6838 Body mass index (BMI) 38.0-38.9, adult: Secondary | ICD-10-CM

## 2023-08-05 DIAGNOSIS — Z7409 Other reduced mobility: Secondary | ICD-10-CM

## 2023-08-05 DIAGNOSIS — N2581 Secondary hyperparathyroidism of renal origin: Secondary | ICD-10-CM | POA: Diagnosis present

## 2023-08-05 DIAGNOSIS — E66812 Obesity, class 2: Secondary | ICD-10-CM | POA: Diagnosis present

## 2023-08-05 DIAGNOSIS — I5022 Chronic systolic (congestive) heart failure: Secondary | ICD-10-CM | POA: Diagnosis present

## 2023-08-05 DIAGNOSIS — R109 Unspecified abdominal pain: Secondary | ICD-10-CM | POA: Diagnosis not present

## 2023-08-05 DIAGNOSIS — E876 Hypokalemia: Secondary | ICD-10-CM | POA: Diagnosis present

## 2023-08-05 DIAGNOSIS — D735 Infarction of spleen: Secondary | ICD-10-CM | POA: Diagnosis not present

## 2023-08-05 DIAGNOSIS — I120 Hypertensive chronic kidney disease with stage 5 chronic kidney disease or end stage renal disease: Secondary | ICD-10-CM | POA: Diagnosis not present

## 2023-08-05 DIAGNOSIS — Z8673 Personal history of transient ischemic attack (TIA), and cerebral infarction without residual deficits: Secondary | ICD-10-CM

## 2023-08-05 DIAGNOSIS — I132 Hypertensive heart and chronic kidney disease with heart failure and with stage 5 chronic kidney disease, or end stage renal disease: Secondary | ICD-10-CM | POA: Diagnosis present

## 2023-08-05 DIAGNOSIS — E785 Hyperlipidemia, unspecified: Secondary | ICD-10-CM | POA: Diagnosis present

## 2023-08-05 DIAGNOSIS — I480 Paroxysmal atrial fibrillation: Secondary | ICD-10-CM | POA: Diagnosis present

## 2023-08-05 DIAGNOSIS — R7881 Bacteremia: Secondary | ICD-10-CM | POA: Diagnosis present

## 2023-08-05 DIAGNOSIS — Z955 Presence of coronary angioplasty implant and graft: Secondary | ICD-10-CM

## 2023-08-05 DIAGNOSIS — K5730 Diverticulosis of large intestine without perforation or abscess without bleeding: Secondary | ICD-10-CM | POA: Diagnosis not present

## 2023-08-05 DIAGNOSIS — Z8249 Family history of ischemic heart disease and other diseases of the circulatory system: Secondary | ICD-10-CM

## 2023-08-05 DIAGNOSIS — J90 Pleural effusion, not elsewhere classified: Secondary | ICD-10-CM | POA: Diagnosis not present

## 2023-08-05 DIAGNOSIS — N12 Tubulo-interstitial nephritis, not specified as acute or chronic: Secondary | ICD-10-CM | POA: Diagnosis present

## 2023-08-05 DIAGNOSIS — K219 Gastro-esophageal reflux disease without esophagitis: Secondary | ICD-10-CM | POA: Diagnosis present

## 2023-08-05 DIAGNOSIS — K8020 Calculus of gallbladder without cholecystitis without obstruction: Secondary | ICD-10-CM | POA: Diagnosis not present

## 2023-08-05 DIAGNOSIS — Z993 Dependence on wheelchair: Secondary | ICD-10-CM

## 2023-08-05 DIAGNOSIS — E871 Hypo-osmolality and hyponatremia: Secondary | ICD-10-CM | POA: Diagnosis present

## 2023-08-05 LAB — CBC WITH DIFFERENTIAL/PLATELET
Abs Immature Granulocytes: 0.17 10*3/uL — ABNORMAL HIGH (ref 0.00–0.07)
Basophils Absolute: 0 10*3/uL (ref 0.0–0.1)
Basophils Relative: 0 %
Eosinophils Absolute: 0.1 10*3/uL (ref 0.0–0.5)
Eosinophils Relative: 1 %
HCT: 29.8 % — ABNORMAL LOW (ref 36.0–46.0)
Hemoglobin: 9.8 g/dL — ABNORMAL LOW (ref 12.0–15.0)
Immature Granulocytes: 1 %
Lymphocytes Relative: 4 %
Lymphs Abs: 0.6 10*3/uL — ABNORMAL LOW (ref 0.7–4.0)
MCH: 30.2 pg (ref 26.0–34.0)
MCHC: 32.9 g/dL (ref 30.0–36.0)
MCV: 92 fL (ref 80.0–100.0)
Monocytes Absolute: 1 10*3/uL (ref 0.1–1.0)
Monocytes Relative: 6 %
Neutro Abs: 14.7 10*3/uL — ABNORMAL HIGH (ref 1.7–7.7)
Neutrophils Relative %: 88 %
Platelets: 255 10*3/uL (ref 150–400)
RBC: 3.24 MIL/uL — ABNORMAL LOW (ref 3.87–5.11)
RDW: 15 % (ref 11.5–15.5)
WBC: 16.7 10*3/uL — ABNORMAL HIGH (ref 4.0–10.5)
nRBC: 0 % (ref 0.0–0.2)

## 2023-08-05 LAB — URINALYSIS, W/ REFLEX TO CULTURE (INFECTION SUSPECTED)
Glucose, UA: NEGATIVE mg/dL
Ketones, ur: NEGATIVE mg/dL
Nitrite: NEGATIVE
Protein, ur: >=300 mg/dL — AB
Specific Gravity, Urine: 1.019 (ref 1.005–1.030)
WBC, UA: >50 WBC/hpf (ref 0–5)
pH: 6 (ref 5.0–8.0)

## 2023-08-05 LAB — COMPREHENSIVE METABOLIC PANEL
ALT: 25 U/L (ref 0–44)
AST: 48 U/L — ABNORMAL HIGH (ref 15–41)
Albumin: 2.4 g/dL — ABNORMAL LOW (ref 3.5–5.0)
Alkaline Phosphatase: 138 U/L — ABNORMAL HIGH (ref 38–126)
Anion gap: 18 — ABNORMAL HIGH (ref 5–15)
BUN: 35 mg/dL — ABNORMAL HIGH (ref 8–23)
CO2: 23 mmol/L (ref 22–32)
Calcium: 8.8 mg/dL — ABNORMAL LOW (ref 8.9–10.3)
Chloride: 88 mmol/L — ABNORMAL LOW (ref 98–111)
Creatinine, Ser: 4.13 mg/dL — ABNORMAL HIGH (ref 0.44–1.00)
GFR, Estimated: 11 mL/min — ABNORMAL LOW (ref >60)
Glucose, Bld: 188 mg/dL — ABNORMAL HIGH (ref 70–99)
Potassium: 2.8 mmol/L — ABNORMAL LOW (ref 3.5–5.1)
Sodium: 129 mmol/L — ABNORMAL LOW (ref 135–145)
Total Bilirubin: 0.9 mg/dL (ref 0.0–1.2)
Total Protein: 6.2 g/dL — ABNORMAL LOW (ref 6.5–8.1)

## 2023-08-05 LAB — I-STAT CG4 LACTIC ACID, ED
Lactic Acid, Venous: 2.3 mmol/L (ref 0.5–1.9)
Lactic Acid, Venous: 1.1 mmol/L (ref 0.5–1.9)

## 2023-08-05 LAB — GLUCOSE, CAPILLARY: Glucose-Capillary: 130 mg/dL — ABNORMAL HIGH (ref 70–99)

## 2023-08-05 LAB — MAGNESIUM: Magnesium: 2 mg/dL (ref 1.7–2.4)

## 2023-08-05 LAB — LIPASE, BLOOD: Lipase: 17 U/L (ref 11–51)

## 2023-08-05 MED ORDER — SENNOSIDES-DOCUSATE SODIUM 8.6-50 MG PO TABS: 1.0000 | ORAL_TABLET | Freq: Every evening | ORAL | Status: DC | PRN

## 2023-08-05 MED ORDER — BISACODYL 5 MG PO TBEC: 5.0000 mg | DELAYED_RELEASE_TABLET | Freq: Every day | ORAL | Status: DC | PRN

## 2023-08-05 MED ORDER — VENLAFAXINE HCL ER 75 MG PO CP24
75.0000 mg | ORAL_CAPSULE | Freq: Every day | ORAL | Status: DC
Start: 2023-08-06 — End: 2023-08-13
  Administered 2023-08-06 – 2023-08-13 (×8): 75 mg via ORAL
  Filled 2023-08-05 (×8): qty 1

## 2023-08-05 MED ORDER — LEVOTHYROXINE SODIUM 50 MCG PO TABS
50.0000 ug | ORAL_TABLET | Freq: Every day | ORAL | Status: DC
  Administered 2023-08-06 – 2023-08-13 (×8): 50 ug via ORAL
  Filled 2023-08-05 (×8): qty 1

## 2023-08-05 MED ORDER — INSULIN GLARGINE 100 UNIT/ML ~~LOC~~ SOLN
15.0000 [IU] | Freq: Every day | SUBCUTANEOUS | Status: DC
  Administered 2023-08-06 – 2023-08-08 (×3): 15 [IU] via SUBCUTANEOUS
  Filled 2023-08-05 (×4): qty 0.15

## 2023-08-05 MED ORDER — CLONAZEPAM 0.5 MG PO TBDP
0.5000 mg | ORAL_TABLET | Freq: Every day | ORAL | Status: DC
  Administered 2023-08-07 – 2023-08-12 (×8): 0.5 mg via ORAL
  Filled 2023-08-05 (×9): qty 1

## 2023-08-05 MED ORDER — SODIUM CHLORIDE 0.9 % IV SOLN
1.0000 g | Freq: Once | INTRAVENOUS | Status: AC
  Administered 2023-08-05: 1 g via INTRAVENOUS
  Filled 2023-08-05: qty 10

## 2023-08-05 MED ORDER — SEVELAMER CARBONATE 800 MG PO TABS
1600.0000 mg | ORAL_TABLET | Freq: Three times a day (TID) | ORAL | Status: DC
  Administered 2023-08-06 – 2023-08-13 (×14): 1600 mg via ORAL
  Filled 2023-08-05 (×20): qty 2

## 2023-08-05 MED ORDER — APIXABAN 5 MG PO TABS
5.0000 mg | ORAL_TABLET | Freq: Two times a day (BID) | ORAL | Status: DC
  Administered 2023-08-06 – 2023-08-13 (×15): 5 mg via ORAL
  Filled 2023-08-05 (×17): qty 1

## 2023-08-05 MED ORDER — SODIUM CHLORIDE 0.9 % IV BOLUS
500.0000 mL | Freq: Once | INTRAVENOUS | Status: AC
  Administered 2023-08-05: 500 mL via INTRAVENOUS

## 2023-08-05 MED ORDER — PANTOPRAZOLE SODIUM 40 MG PO TBEC
40.0000 mg | DELAYED_RELEASE_TABLET | Freq: Two times a day (BID) | ORAL | Status: DC
  Administered 2023-08-06 – 2023-08-12 (×12): 40 mg via ORAL
  Filled 2023-08-05 (×13): qty 1

## 2023-08-05 MED ORDER — ATORVASTATIN CALCIUM 80 MG PO TABS
80.0000 mg | ORAL_TABLET | Freq: Every day | ORAL | Status: DC
  Administered 2023-08-06 – 2023-08-13 (×8): 80 mg via ORAL
  Filled 2023-08-05 (×8): qty 1

## 2023-08-05 MED ORDER — ACETAMINOPHEN 650 MG RE SUPP: 650.0000 mg | Freq: Four times a day (QID) | RECTAL | Status: DC | PRN

## 2023-08-05 MED ORDER — ACETAMINOPHEN 325 MG PO TABS
650.0000 mg | ORAL_TABLET | Freq: Four times a day (QID) | ORAL | Status: DC | PRN
  Administered 2023-08-10 – 2023-08-12 (×2): 650 mg via ORAL
  Filled 2023-08-05 (×2): qty 2

## 2023-08-05 MED ORDER — SODIUM CHLORIDE 0.9 % IV SOLN: 1.0000 g | INTRAVENOUS | Status: DC

## 2023-08-05 MED ORDER — POTASSIUM CHLORIDE CRYS ER 20 MEQ PO TBCR
40.0000 meq | EXTENDED_RELEASE_TABLET | Freq: Once | ORAL | Status: AC
  Administered 2023-08-05: 40 meq via ORAL
  Filled 2023-08-05: qty 2

## 2023-08-05 MED ORDER — INSULIN ASPART 100 UNIT/ML IJ SOLN
0.0000 [IU] | Freq: Three times a day (TID) | INTRAMUSCULAR | Status: DC
  Administered 2023-08-06 (×2): 1 [IU] via SUBCUTANEOUS
  Administered 2023-08-07: 2 [IU] via SUBCUTANEOUS
  Administered 2023-08-07: 1 [IU] via SUBCUTANEOUS
  Administered 2023-08-08: 9 [IU] via SUBCUTANEOUS

## 2023-08-05 MED ORDER — IOHEXOL 350 MG/ML SOLN
70.0000 mL | Freq: Once | INTRAVENOUS | Status: AC | PRN
  Administered 2023-08-05: 70 mL via INTRAVENOUS

## 2023-08-05 MED ORDER — INSULIN ASPART 100 UNIT/ML IJ SOLN
0.0000 [IU] | Freq: Every day | INTRAMUSCULAR | Status: DC
  Administered 2023-08-07: 4 [IU] via SUBCUTANEOUS

## 2023-08-05 MED ORDER — MELATONIN 3 MG PO TABS
3.0000 mg | ORAL_TABLET | Freq: Every day | ORAL | Status: DC
  Administered 2023-08-07 – 2023-08-12 (×7): 3 mg via ORAL
  Filled 2023-08-05 (×9): qty 1

## 2023-08-05 MED ORDER — PROCHLORPERAZINE EDISYLATE 10 MG/2ML IJ SOLN
10.0000 mg | Freq: Four times a day (QID) | INTRAMUSCULAR | Status: DC | PRN
  Administered 2023-08-07 – 2023-08-13 (×3): 10 mg via INTRAVENOUS
  Filled 2023-08-05 (×4): qty 2

## 2023-08-05 MED ORDER — SODIUM CHLORIDE 0.9 % IV BOLUS: 1000.0000 mL | Freq: Once | INTRAVENOUS | Status: DC

## 2023-08-05 MED ORDER — AMIODARONE HCL 200 MG PO TABS
200.0000 mg | ORAL_TABLET | Freq: Every day | ORAL | Status: DC
  Administered 2023-08-06 – 2023-08-13 (×8): 200 mg via ORAL
  Filled 2023-08-05 (×8): qty 1

## 2023-08-05 MED ORDER — MIDODRINE HCL 5 MG PO TABS
10.0000 mg | ORAL_TABLET | ORAL | Status: DC
  Administered 2023-08-07: 10 mg via ORAL
  Administered 2023-08-08 – 2023-08-13 (×3): 20 mg via ORAL
  Filled 2023-08-05: qty 2
  Filled 2023-08-05 (×2): qty 4
  Filled 2023-08-05: qty 2
  Filled 2023-08-05: qty 4

## 2023-08-05 NOTE — ED Provider Notes (Addendum)
 [facility] EMERGENCY DEPARTMENT AT [facility] Provider Note   CSN: [id number] Arrival date & time: 08/05/23  1416     History  Chief Complaint  Patient presents with   Abdominal Pain   Dysuria   Back Pain    Susan [name] is a 73 y.o. female.  Pt is a 73 yo female with pmhx significant for ESRD on HD (Tu, Th, Sat), HLD, hypothyroidism, HTN, CHF, depression, CVA, DM2, CAD, and GERD.  Pt came to the ED today with pain with urination and lower abd pain and back pain.  Pt said she still urinates.  Pt said her BP stays low normally.  She does take Midodrine for hypotension on dialysis days.  Initial BP in triage was in the 70s, but 2nd reading in room 96 which is her normal.        Home Medications Prior to Admission medications   Medication Sig Start Date End Date Taking? Authorizing Provider  acetaminophen (TYLENOL) 325 MG tablet Take 2 tablets (650 mg total) by mouth every 6 (six) hours as needed for mild pain (or Fever >/= 101). 12/10/22   Angiulli, Mcarthur Rossetti, PA-C  albuterol (VENTOLIN HFA) 108 (90 Base) MCG/ACT inhaler Inhale 1-2 puffs into the lungs every 6 (six) hours as needed for shortness of breath.    [provider]  amiodarone (PACERONE) 200 MG tablet TAKE 1 TABLET BY MOUTH EVERY DAY 07/17/23   Wendall Stade, MD  apixaban (ELIQUIS) 5 MG TABS tablet Take 1 tablet (5 mg total) by mouth 2 (two) times daily. 03/27/23   Gaston Islam., NP  ascorbic acid (VITAMIN C) 500 MG tablet Take 1 tablet (500 mg total) by mouth daily. 12/11/22   Angiulli, Mcarthur Rossetti, PA-C  atorvastatin (LIPITOR) 80 MG tablet TAKE 1 TABLET BY MOUTH EVERY DAY 07/06/23   Bensimhon, Bevelyn Buckles, MD  benzonatate (TESSALON PERLES) 100 MG capsule Take 1 capsule (100 mg total) by mouth 3 (three) times daily as needed for cough. Patient not taking: Reported on 07/29/2023 06/17/23   Bernadette Hoit, MD  carvedilol (COREG) 6.25 MG tablet Take 6.25 mg by mouth at bedtime. Take on Mon/Wed/Fri -non Dialysis  days 08/11/22   [provider]  clonazePAM (KLONOPIN) 0.5 MG disintegrating tablet Take 1 tablet (0.5 mg total) by mouth 2 (two) times daily. 06/30/23   Donita Brooks, MD  folic acid (FOLVITE) 1 MG tablet Take 1 mg by mouth daily. 04/08/23   [provider]  folic acid-vitamin b complex-vitamin c-selenium-zinc (DIALYVITE) 3 MG TABS tablet Take 1 tablet by mouth daily.    [provider]  insulin aspart (NOVOLOG) 100 UNIT/ML injection Inject 0-15 Units into the skin 3 (three) times daily with meals. CBG < 70: treat low blood sugar, CBG 70 - 120: 0 units, CBG 121 - 150: 2 units, CBG 151 - 200: 3 units, CBG 201 - 250: 5 units, CBG 251 - 300: 8 units, CBG 301 - 350: 11 units, CBG 351 - 400: 15 units, CBG > 400: call MD 03/04/23   Donita Brooks, MD  insulin glargine (LANTUS) 100 UNIT/ML Solostar Pen Inject 26 Units into the skin daily. Patient taking differently: Inject 31 Units into the skin every morning. 02/02/23   Zigmund Daniel., MD  levothyroxine (SYNTHROID) 75 MCG tablet Take 1 tablet (75 mcg total) by mouth daily before breakfast. Patient taking differently: Take 50 mcg by mouth daily before breakfast. 12/11/22   Angiulli, Mcarthur Rossetti,  PA-C  LORazepam (ATIVAN) 0.5 MG tablet TAKE 1 TABLET BY MOUTH AT BEDTIME. 08/04/23   Donita Brooks, MD  melatonin 3 MG TABS tablet Take 3 mg by mouth at bedtime.    [provider]  methocarbamol (ROBAXIN) 750 MG tablet TAKE 1 TABLET (750 MG TOTAL) BY MOUTH EVERY 6 (SIX) HOURS AS NEEDED FOR MUSCLE SPASMS 07/27/23   Donita Brooks, MD  Methoxy PEG-Epoetin Beta (MIRCERA IJ) Mircera 02/05/23 02/04/24  [provider]  midodrine (PROAMATINE) 10 MG tablet Take 1 tablet (10 mg total) by mouth daily. Patient taking differently: Take 10-20 mg by mouth Every Tuesday,Thursday,and Saturday with dialysis. 04/10/23   Donita Brooks, MD  nitroGLYCERIN (NITROSTAT) 0.4 MG SL tablet Place 1 tablet (0.4 mg total) under the tongue  every 5 (five) minutes as needed. 03/27/23 07/29/23  Gaston Islam., NP  OXYGEN Inhale 2 L into the lungs at bedtime.    [provider]  pantoprazole (PROTONIX) 40 MG tablet TAKE 1 TABLET (40 MG TOTAL) BY MOUTH TWICE A DAY BEFORE MEALS 07/06/23   Donita Brooks, MD  polyethylene glycol (MIRALAX / GLYCOLAX) 17 g packet Take 17 g by mouth daily. Patient taking differently: Take 17 g by mouth daily as needed for mild constipation or moderate constipation. 12/10/22   Angiulli, Mcarthur Rossetti, PA-C  prochlorperazine (COMPAZINE) 5 MG tablet Take 1 tablet (5 mg total) by mouth every 8 (eight) hours as needed for nausea or vomiting. 03/03/23 07/31/23  Donita Brooks, MD  sevelamer carbonate (RENVELA) 800 MG tablet Take 1,600 mg by mouth 3 (three) times daily with meals.    [provider]  triamcinolone cream (KENALOG) 0.1 % Apply 1 Application topically 2 (two) times daily. Apply to affected areas BID Patient not taking: Reported on 07/29/2023 06/17/23   Bernadette Hoit, MD  venlafaxine XR (EFFEXOR-XR) 75 MG 24 hr capsule TAKE 1 CAPSULE (75 MG TOTAL) BY MOUTH DAILY WITH BREAKFAST. STOP TRINTELLIX 07/06/23   Donita Brooks, MD  Zinc 30 MG CAPS Take 30 mg by mouth daily.    [provider]      Allergies    Cephalexin and Codeine    Review of Systems   Review of Systems  Gastrointestinal:  Positive for abdominal pain.  Genitourinary:  Positive for dysuria.  All other systems reviewed and are negative.   Physical Exam Updated Vital Signs BP (!) 99/58   Pulse 83   Temp 98.1 F (36.7 C) (Oral)   Resp 19   Ht 5\' 5"  (1.651 m)   Wt 104.8 kg   SpO2 98%   BMI 38.44 kg/m  Physical Exam Vitals and nursing note reviewed.  Constitutional:      Appearance: She is well-developed.  HENT:     Head: Normocephalic and atraumatic.     Mouth/Throat:     Mouth: Mucous membranes are moist.     Pharynx: Oropharynx is clear.  Eyes:     Extraocular Movements: Extraocular movements  intact.     Pupils: Pupils are equal, round, and reactive to light.  Cardiovascular:     Rate and Rhythm: Normal rate and regular rhythm.     Heart sounds: Normal heart sounds.  Pulmonary:     Effort: Pulmonary effort is normal.     Breath sounds: Normal breath sounds.  Abdominal:     General: Abdomen is flat. Bowel sounds are normal.     Palpations: Abdomen is soft.     Tenderness: There  is abdominal tenderness in the suprapubic area.  Skin:    General: Skin is warm.     Capillary Refill: Capillary refill takes less than 2 seconds.  Neurological:     General: No focal deficit present.     Mental Status: She is alert and oriented to person, place, and time.  Psychiatric:        Mood and Affect: Mood normal.        Behavior: Behavior normal.     ED Results / Procedures / Treatments   Labs (all labs ordered are listed, but only abnormal results are displayed) Labs Reviewed  CBC WITH DIFFERENTIAL/PLATELET - Abnormal; Notable for the following components:      Result Value   WBC 16.7 (*)    RBC 3.24 (*)    Hemoglobin 9.8 (*)    HCT 29.8 (*)    Neutro Abs 14.7 (*)    Lymphs Abs 0.6 (*)    Abs Immature Granulocytes 0.17 (*)    All other components within normal limits  COMPREHENSIVE METABOLIC PANEL - Abnormal; Notable for the following components:   Sodium 129 (*)    Potassium 2.8 (*)    Chloride 88 (*)    Glucose, Bld 188 (*)    BUN 35 (*)    Creatinine, Ser 4.13 (*)    Calcium 8.8 (*)    Total Protein 6.2 (*)    Albumin 2.4 (*)    AST 48 (*)    Alkaline Phosphatase 138 (*)    GFR, Estimated 11 (*)    Anion gap 18 (*)    All other components within normal limits  I-STAT CG4 LACTIC ACID, ED - Abnormal; Notable for the following components:   Lactic Acid, Venous 2.3 (*)    All other components within normal limits  CULTURE, BLOOD (ROUTINE X 2)  CULTURE, BLOOD (ROUTINE X 2)  LIPASE, BLOOD  URINALYSIS, W/ REFLEX TO CULTURE (INFECTION SUSPECTED)    EKG EKG  Interpretation Date/Time:  Wednesday August 05 2023 14:28:08 EDT Ventricular Rate:  90 PR Interval:  198 QRS Duration:  156 QT Interval:  462 QTC Calculation: 565 R Axis:   -74  Text Interpretation: Normal sinus rhythm Left axis deviation Right bundle branch block Left ventricular hypertrophy with repolarization abnormality ( R in aVL ) Anteroseptal infarct , age undetermined Abnormal ECG When compared with ECG of 13-Apr-2023 12:29, PREVIOUS ECG IS PRESENT No significant change since last tracing Confirmed by Jacalyn Lefevre [phone]) on 08/05/2023 3:18:31 PM  Radiology No results found.  Procedures Procedures    Medications Ordered in ED Medications  sodium chloride 0.9 % bolus 500 mL (500 mLs Intravenous New Bag/Given 08/05/23 1521)    ED Course/ Medical Decision Making/ A&P                                 Medical Decision Making Amount and/or Complexity of Data Reviewed Labs: ordered. Radiology: ordered.   This patient presents to the ED for concern of abd pain, this involves an extensive number of treatment options, and is a complaint that carries with it a high risk of complications and morbidity.  The differential diagnosis includes uti, infection   Co morbidities that complicate the patient evaluation  ESRD on HD (Tu, Th, Sat), HLD, hypothyroidism, HTN, CHF, depression, CVA, DM2, CAD, and GERD   Additional history obtained:  Additional history obtained from epic chart review  Lab Tests:  I Ordered, and personally interpreted labs.  The pertinent results include:  lactic 2.3; cbc with wbc elevated at 16.7, hgb low at 9.8 (stable); cmp with na low at 129 and k low at 2.8, bun 35 and cr 4.13 (stable)  Medicines ordered and prescription drug management:  I ordered medication including ivfs  for sx  Reevaluation of the patient after these medicines showed that the patient improved I have reviewed the patients home medicines and have made adjustments as  needed  Problem List / ED Course:  Abd pain:  etiology unclear.  Eval pending Hypokalemia/hyponatremia:  likely related to recent dialysis.  No need to replace   Reevaluation:  After the interventions noted above, I reevaluated the patient and found that they have :improved   Social Determinants of Health:  Lives at home   Dispostion:  Pending at shift change    Final Clinical Impression(s) / ED Diagnoses Final diagnoses:  Lower abdominal pain  ESRD on hemodialysis [facility])    Rx / DC Orders ED Discharge Orders     None         Jacalyn Lefevre, MD 08/05/23 1524    Jacalyn Lefevre, MD 08/05/23 [phone]

## 2023-08-05 NOTE — Therapy (Addendum)
 OUTPATIENT PHYSICAL THERAPY PROSTHETIC TREATMENT    Patient Name: Susan [name] MRN: [id number] DOB:May 02, 1951, 73 y.o., female Today's Date: 08/05/2023  PCP: Donita Brooks, MD  REFERRING PROVIDER: Nadara Mustard, MD   END OF SESSION:  PT End of Session - 08/05/23 1337     Visit Number 32    Number of Visits 44    Date for PT Re-Evaluation 09/10/23    Authorization Type [employer]    Progress Note Due on Visit 40    PT Start Time 1345    PT Stop Time 1405    PT Time Calculation (min) 20 min    Equipment Utilized During Treatment Gait belt    Activity Tolerance Patient limited by lethargy;Patient limited by pain;Treatment limited secondary to medical complications (Comment)   patient with c/o back pain, abdominal pain, sleepiness, and burning sensation when urinating.   Behavior During Therapy WFL for tasks assessed/performed               Past Medical History:  Diagnosis Date   Anemia    hx   Anxiety    Arthritis    "generalized" (03/15/2014)   CAD (coronary artery disease)    MI in 2000 - MI  2007 - treated bare metal stent (no nuclear since then as 9/11)   Carotid artery disease (HCC)    Chronic diastolic heart failure (HCC)    a) ECHO (08/2013) EF 55-60% and RV function nl b) RHC (08/2013) RA 4, RV 30/5/7, PA 25/10 (16), PCWP 7, Fick CO/CI 6.3/2.7, PVR 1.5 WU, PA 61 and 66%   Daily headache    "~ every other day; since I fell in June" (03/15/2014)   Depression    Diabetic retinopathy (HCC)    Dyslipidemia    ESRD (end stage renal disease) (HCC)    Dialysis on Tues Thurs Sat   Exertional shortness of breath    GERD (gastroesophageal reflux disease)    History of blood transfusion    History of kidney stones    HTN (hypertension)    Hypothyroidism    Myocardial infarction (HCC)    Obesity    Osteoarthritis    PAF (paroxysmal atrial fibrillation) (HCC)    Peripheral neuropathy    bilateral feet/hands   PONV (postoperative nausea and  vomiting)    RBBB (right bundle branch block)    Old   Stroke (HCC)    mini strokes   Type II diabetes mellitus (HCC)    Type II, [name]. patient has omnipod insulin pump with Novolin R Insulin   Past Surgical History:  Procedure Laterality Date   A/V FISTULAGRAM Left 11/07/2022   Procedure: A/V Fistulagram;  Surgeon: Leonie Douglas, MD;  [facility];  Service: Cardiovascular;  Laterality: Left;   ABDOMINAL HYSTERECTOMY  1980's   AMPUTATION Right 02/24/2018   Procedure: RIGHT FOOT GREAT TOE AND 2ND TOE AMPUTATION;  Surgeon: Nadara Mustard, MD;  Location: [facility] OR;  Service: Orthopedics;  Laterality: Right;   AMPUTATION Right 04/30/2018   Procedure: RIGHT TRANSMETATARSAL AMPUTATION;  Surgeon: Nadara Mustard, MD;  Location: [facility] OR;  Service: Orthopedics;  Laterality: Right;   AMPUTATION Right 05/02/2022   Procedure: RIGHT BELOW KNEE AMPUTATION;  Surgeon: Nadara Mustard, MD;  Location: [facility] OR;  Service: Orthopedics;  Laterality: Right;   APPLICATION OF WOUND VAC Right 06/13/2022   Procedure: APPLICATION OF WOUND VAC;  Surgeon: Nadara Mustard, MD;  Location: [facility]  OR;  Service: Orthopedics;  Laterality: Right;   APPLICATION OF WOUND VAC Left 11/14/2022   Procedure: APPLICATION OF WOUND VAC;  Surgeon: Nadara Mustard, MD;  [facility]: [facility] OR;  Service: Orthopedics;  Laterality: Left;   AV FISTULA PLACEMENT Left 04/02/2022   Procedure: LEFT ARM ARTERIOVENOUS (AV) FISTULA CREATION;  Surgeon: Nada Libman, MD;  [facility]: [facility];  Service: Vascular;  Laterality: Left;  PERIPHERAL NERVE BLOCK   AV FISTULA PLACEMENT Right 04/27/2023   Procedure: RIGHT ARM BRACHIOBASILIC ARTERIOVENOUS (AV) FISTULA CREATION;  Surgeon: Leonie Douglas, MD;  [facility]: [facility] OR;  Service: Vascular;  Laterality: Right;   BASCILIC VEIN TRANSPOSITION Left 07/31/2022   Procedure: LEFT ARM SECOND STAGE BASILIC VEIN TRANSPOSITION;  Surgeon: Nada Libman, MD;  [facility]: [facility] OR;  Service: Vascular;   Laterality: Left;   BIOPSY  05/27/2020   Procedure: BIOPSY;  Surgeon: Lanelle Bal, DO;  [facility]: [facility];  Service: Endoscopy;;   CATARACT EXTRACTION, BILATERAL Bilateral ?2013   COLONOSCOPY W/ POLYPECTOMY     COLONOSCOPY WITH PROPOFOL N/A 03/13/2019   Procedure: COLONOSCOPY WITH PROPOFOL;  Surgeon: Beverley Fiedler, MD;  Location: [facility] ENDOSCOPY;  Service: Gastroenterology;  Laterality: N/A;   CORONARY ANGIOPLASTY WITH STENT PLACEMENT  1999; 2007   "1 + 1"   ERCP N/A 02/03/2022   Procedure: ENDOSCOPIC RETROGRADE CHOLANGIOPANCREATOGRAPHY (ERCP);  Surgeon: Jeani Hawking, MD;  Location: [facility] ENDOSCOPY;  Service: Gastroenterology;  Laterality: N/A;   ESOPHAGOGASTRODUODENOSCOPY N/A 02/12/2023   Procedure: ESOPHAGOGASTRODUODENOSCOPY (EGD);  Surgeon: Jeani Hawking, MD;  Location: [facility] ENDOSCOPY;  Service: Gastroenterology;  Laterality: N/A;   ESOPHAGOGASTRODUODENOSCOPY (EGD) WITH PROPOFOL N/A 03/13/2019   Procedure: ESOPHAGOGASTRODUODENOSCOPY (EGD) WITH PROPOFOL;  Surgeon: Beverley Fiedler, MD;  Location: [facility] ENDOSCOPY;  Service: Gastroenterology;  Laterality: N/A;   ESOPHAGOGASTRODUODENOSCOPY (EGD) WITH PROPOFOL N/A 05/27/2020   Procedure: ESOPHAGOGASTRODUODENOSCOPY (EGD) WITH PROPOFOL;  Surgeon: Lanelle Bal, DO;  [facility];  Service: Endoscopy;  Laterality: N/A;   ESOPHAGOGASTRODUODENOSCOPY (EGD) WITH PROPOFOL N/A 09/03/2022   Procedure: ESOPHAGOGASTRODUODENOSCOPY (EGD) WITH PROPOFOL;  Surgeon: Jeani Hawking, MD;  Location: [facility] ENDOSCOPY;  Service: Gastroenterology;  Laterality: N/A;   EYE SURGERY Bilateral    lazer   FLEXIBLE SIGMOIDOSCOPY N/A 05/23/2022   Procedure: FLEXIBLE SIGMOIDOSCOPY;  Surgeon: Jeani Hawking, MD;  Location: [facility] ENDOSCOPY;  Service: Gastroenterology;  Laterality: N/A;   FLEXIBLE SIGMOIDOSCOPY N/A 05/24/2022   Procedure: FLEXIBLE SIGMOIDOSCOPY;  Surgeon: Imogene Burn, MD;  Location: [facility] ENDOSCOPY;  Service: Gastroenterology;  Laterality: N/A;   FLEXIBLE  SIGMOIDOSCOPY N/A 09/03/2022   Procedure: FLEXIBLE SIGMOIDOSCOPY;  Surgeon: Jeani Hawking, MD;  Location: [facility] ENDOSCOPY;  Service: Gastroenterology;  Laterality: N/A;   HEMOSTASIS CLIP PLACEMENT  03/13/2019   Procedure: HEMOSTASIS CLIP PLACEMENT;  Surgeon: Beverley Fiedler, MD;  Location: [facility] ENDOSCOPY;  Service: Gastroenterology;;   HEMOSTASIS CLIP PLACEMENT  05/23/2022   Procedure: HEMOSTASIS CLIP PLACEMENT;  Surgeon: Jeani Hawking, MD;  Location: [facility] ENDOSCOPY;  Service: Gastroenterology;;   HEMOSTASIS CONTROL  05/24/2022   Procedure: HEMOSTASIS CONTROL;  Surgeon: Imogene Burn, MD;  Location: [facility] ENDOSCOPY;  Service: Gastroenterology;;   HOT HEMOSTASIS N/A 05/23/2022   Procedure: HOT HEMOSTASIS (ARGON PLASMA COAGULATION/BICAP);  Surgeon: Jeani Hawking, MD;  Location: [facility] ENDOSCOPY;  Service: Gastroenterology;  Laterality: N/A;   I & D EXTREMITY Left 05/05/2022   Procedure: IRRIGATION AND DEBRIDEMENT LEFT ARM AV FISTULA;  Surgeon: Cephus Shelling, MD;  Location: [facility] OR;  Service: Vascular;  Laterality: Left;   I & D EXTREMITY  N/A 11/14/2022   Procedure: IRRIGATION AND DEBRIDEMENT OF LOWER EXTREMITY WOUND;  Surgeon: Nadara Mustard, MD;  Location: [facility] OR;  Service: Orthopedics;  Laterality: N/A;   INSERTION OF DIALYSIS CATHETER Right 04/02/2022   Procedure: INSERTION OF TUNNELED DIALYSIS CATHETER;  Surgeon: Nada Libman, MD;  Location: [facility] OR;  Service: Vascular;  Laterality: Right;   KNEE ARTHROSCOPY Left 10/25/2006   POLYPECTOMY  03/13/2019   Procedure: POLYPECTOMY;  Surgeon: Beverley Fiedler, MD;  [facility] ENDOS[facility];  Service: Gastroenterology;;   REMOVAL OF STONES  02/03/2022   Procedure: REMOVAL OF STONES;  Surgeon: Jeani Hawking, MD;  Location: [facility] ENDOSCOPY;  Service: Gastroenterology;;   REVISON OF ARTERIOVENOUS FISTULA Left 08/20/2022   Procedure: REVISON OF LEFT ARM ARTERIOVENOUS FISTULA;  Surgeon: Nada Libman, MD;  Location[facility] OR;  Service: Vascular;  Laterality: Left;   RIGHT  HEART CATH N/A 07/24/2017   Procedure: RIGHT HEART CATH;  Surgeon: Dolores Patty, MD;  [facility];  Service: Cardiovascular;  Laterality: N/A;   RIGHT HEART CATHETERIZATION N/A 09/22/2013   Procedure: RIGHT HEART CATH;  Surgeon: Dolores Patty, MD;  Location: [facility] CATH LAB;  Service: Cardiovascular;  Laterality: N/A;   SHOULDER ARTHROSCOPY WITH OPEN ROTATOR CUFF REPAIR Right 03/14/2014   Procedure: RIGHT SHOULDER ARTHROSCOPY WITH BICEPS RELEASE, OPEN SUBSCAPULA REPAIR, OPEN SUPRASPINATUS REPAIR.;  Surgeon: Cammy Copa, MD;  Location: [facility] OR;  Service: Orthopedics;  Laterality: Right;   SPHINCTEROTOMY  02/03/2022   Procedure: SPHINCTEROTOMY;  Surgeon: Jeani Hawking, MD;  Location: [name] ENDOSCOPY;  Service: Gastroenterology;;   STUMP REVISION Right 06/13/2022   Procedure: REVISION RIGHT BELOW KNEE AMPUTATION;  Surgeon: Nadara Mustard, MD;  Location: [facility] OR;  Service: Orthopedics;  Laterality: Right;   TEE WITHOUT CARDIOVERSION N/A 02/04/2022   Procedure: TRANSESOPHAGEAL ECHOCARDIOGRAM (TEE);  Surgeon: Dolores Patty, MD;  Location: [facility] ENDOSCOPY;  Service: Cardiovascular;  Laterality: N/A;   THROMBECTOMY W/ EMBOLECTOMY Left 08/20/2022   Procedure: THROMBECTOMY OF LEFT ARM ARTERIOVENOUS FISTULA;  Surgeon: Nada Libman, MD;  Location: [facility] OR;  Service: Vascular;  Laterality: Left;   TOE AMPUTATION Right 02/24/2018   GREAT TOE AND 2ND TOE AMPUTATION   TUBAL LIGATION  1970's   UPPER EXTREMITY VENOGRAPHY N/A 07/31/2023   Procedure: UPPER EXTREMITY VENOGRAPHY;  Surgeon: Leonie Douglas, MD;  [facility];  Service: Cardiovascular;  Laterality: N/A;   Patient Active Problem List   Diagnosis Date Noted   Atrial fibrillation (HCC) 06/17/2023   Atopic dermatitis 06/17/2023   Hyperosmolar hyperglycemic state (HHS) (HCC) 02/05/2023   Gastroparesis 02/05/2023   Depression 01/29/2023   Intractable nausea and vomiting 01/20/2023   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 difficile colitis 06/05/2022   Spleen hematoma 06/05/2022   Dehiscence of amputation stump of right lower extremity (HCC) 06/05/2022   Rectal ulcer 05/27/2022   ESRD (end stage renal disease) (HCC) 05/27/2022   GI bleed 05/23/2022   Difficult intravenous access 05/23/2022  Gangrene of right foot (HCC) 05/02/2022   S/P BKA (below knee amputation) unilateral, right (HCC) 05/02/2022   Unspecified protein-calorie malnutrition (HCC) 04/15/2022   Secondary hyperparathyroidism of renal origin (HCC) 04/14/2022   Coagulation defect, unspecified (HCC) 04/09/2022   Acquired absence of other left toe(s) (HCC) 04/07/2022   Allergy, unspecified, initial encounter 04/07/2022   Dependence on renal dialysis (HCC) 04/07/2022   Gout due to renal impairment, unspecified site 04/07/2022   Hypertensive heart and chronic kidney disease with heart failure and with stage 5 chronic kidney disease, or end stage renal disease (HCC) 04/07/2022   Personal history of transient ischemic attack (TIA), and cerebral infarction without residual deficits 04/07/2022   Renal osteodystrophy 04/07/2022   Venous stasis ulcer of right calf (HCC) 03/31/2022   Fistula, colovaginal 03/26/2022   Diarrhea 03/26/2022   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   Sepsis without acute organ dysfunction (HCC)    Bacteremia    Acute pancreatitis 02/01/2022   Abdominal pain  02/01/2022   SIRS (systemic inflammatory response syndrome) (HCC) 02/01/2022   Transaminitis 02/01/2022   History of anemia due to chronic kidney disease 02/01/2022   Paroxysmal atrial fibrillation (HCC) 02/01/2022   Uncontrolled type 2 diabetes mellitus with hyperglycemia, with long-term current use of insulin (HCC) 01/14/2022   NSTEMI (non-ST elevated myocardial infarction) (HCC) 03/05/2021   Acute renal failure superimposed on stage 4 chronic kidney disease (HCC) 08/22/2020   Hypoalbuminemia 05/25/2020   GERD (gastroesophageal reflux disease) 05/25/2020   Pressure injury of skin 05/17/2020   Acute on chronic combined systolic and diastolic congestive heart failure (HCC) 03/07/2020   Type 2 diabetes mellitus with diabetic polyneuropathy, with long-term current use of insulin (HCC) 03/07/2020   Obesity, Class III, BMI 40-49.9 (morbid obesity) (HCC) 03/07/2020   Common bile duct (CBD) obstruction 05/28/2019   Benign neoplasm of ascending colon    Benign neoplasm of transverse colon    Benign neoplasm of descending colon    Benign neoplasm of sigmoid colon    Gastric polyps    Hyperkalemia 03/11/2019   Prolonged QT interval 03/11/2019   Acute blood loss anemia 03/11/2019   Onychomycosis 06/21/2018   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)    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 (HCC)    PVD (peripheral vascular disease) (HCC) 10/26/2017   E-coli UTI 07/27/2017   Hypothyroidism 07/27/2017   AKI (acute kidney injury) (HCC)    PAH (pulmonary artery hypertension) (HCC)    Impaired ambulation 07/19/2017   Leg cramps 02/27/2017   Peripheral edema 01/12/2017   Diabetic neuropathy (HCC) 11/12/2016   CKD (chronic kidney disease), stage IV (HCC) 10/24/2015   Anemia of chronic disease 10/03/2015   Historical diagnosis of generalized anxiety disorder 10/03/2015   Secondary insomnia 10/03/2015   Acute bronchitis 09/05/2015   Hyperglycemia due to diabetes  mellitus (HCC) 06/07/2015   Non compliance with medical treatment 04/17/2014   Rotator cuff tear 03/14/2014   Obesity 09/23/2013   Chronic HFrEF (heart failure with reduced ejection fraction) (HCC) 06/03/2013   Hypotension 12/25/2012   Hyponatremia 12/25/2012   Urinary incontinence    MDD (major depressive disorder), recurrent episode, moderate (HCC) 11/12/2010   RBBB (right bundle branch block)    Wide-complex tachycardia    Coronary artery disease    Hyperlipemia 01/22/2009   Chronic hypotension 01/22/2009    ONSET DATE: 03/09/2023 MD referral to PT  REFERRING DIAG: S88.111D (ICD-10-CM) - Below-knee amputation of right lower extremity, subsequent encounter   THERAPY DIAG:  Unsteadiness on feet  Impaired functional mobility, balance, gait, and endurance  Muscle weakness (generalized)  Other abnormalities of gait and mobility  Right below-knee amputee (HCC)  Rationale for Evaluation and Treatment: Rehabilitation  SUBJECTIVE:  SUBJECTIVE STATEMENT: Patient states she is not feeling well c/o back pain and burning with urination.   PERTINENT HISTORY: Right TTA 05/02/22, Lymphedema, idiopathic chronic venous HTN with ulcer, depression, colitis, diverticulitis, ESRD, gout, NSTEMI, DM2, polyneuropathy, PVD, CAD, right bundle branch block, mini strokes  PAIN:  Are you having pain? Back pain  PRECAUTIONS: Fall and Other: No BP LUE  WEIGHT BEARING RESTRICTIONS: No  FALLS: Has patient fallen in last 6 months? No  LIVING ENVIRONMENT: Lives with: lives with their spouse and 2 small dogs Lives in: mobile home Home Access: Ramped entrance Home layout: One level Stairs: Yes: External: 6-7 steps; can reach both Has following equipment at home: Single point cane, Environmental consultant - 2 wheeled, Environmental consultant - 4 wheeled, Wheelchair (manual), [employer], Grab bars, and Ramped entry  OCCUPATION:  retired  PLOF: prior to amputation for ~year used RW in home & community  PATIENT GOALS:  to use  prosthesis to walk, get out w/c in & out of car, get to bathroom.   OBJECTIVE:  COGNITION: Overall cognitive status:  Eval on 03/23/2023:   Within functional limits for tasks assessed  POSTURE:  Eval on 03/23/2023:  rounded shoulders, forward head, increased thoracic kyphosis, flexed trunk , and weight shift left  LOWER EXTREMITY ROM: ROM Right eval Left eval  Hip flexion    Hip extension    Hip abduction    Hip adduction    Hip internal rotation    Hip external rotation    Knee flexion    Knee extension    Ankle dorsiflexion    Ankle plantarflexion    Ankle inversion    Ankle eversion     (Blank rows = not tested)  LOWER EXTREMITY MMT:  MMT Right eval Left eval  Hip flexion 3-/5 3-/5  Hip extension 2/5 2/5  Hip abduction 2+/5 2+/5  Hip adduction    Hip internal rotation    Hip external rotation    Knee flexion 3-/5 3-/5  Knee extension 3-/5 3-/5  Ankle dorsiflexion    Ankle plantarflexion    Ankle inversion    Ankle eversion    At Evaluation all strength testing is grossly seated and functionally standing / gait. (Blank rows = not tested)  TRANSFERS: 07/27/2023: -Sit>stand from wc with RW: MinA with heavy vc for sequencing and offweighting LE -Stand>sit wc with RW: CGA with patient performing safe controlled descent  06/15/2023: -Sit to stand transfers with RW wheelchair<>mat table at varied height: 19in with maxA to rise to stand; 22in ModA rise to stand, 24in min-modA rise to stand. -stand pivot transfer with RW w/c to mat table with modA once standing.  -Lateral scooting wc<>table at level height SBA; vc for LE positioning for optimal use of LE during transfer -lateral scoot R: modA due to fatigue and uneven surface transfer from low surface to higher surface   05/04/2023: Squat pivot transfers with modA.  04/21/2023: Pt sit to stand w/c to //bar with one hand pushing on w/c and other hand on //bar with modA first time & MinA 2nd/ 3rd time.  Best is RUE  on //bar & LUE on w/c.  Stand to sit reaching LUE to w/c with minA to control descent.   PT demo & verbal cues on sit to/from stand  w/c to RW.  1st time modA 2 person. 2nd time maxA one person.  Stand to sit with minA and constant cueing. Scooting transfers with minA with w/c & mat direct contact & same ht.   Eval on 03/23/2023:  Sit to stand: Max A (two-person assist) from 22" w/c to rolling walker Stand to sit: Max A (two-person assist) rolling walker to wheelchair  FUNCTIONAL TESTs:   07/27/2023: Patient stood with RW SBA for 54sec before c/o lightheadedness and sitting down to toilet. Resisted nudges applied posteriorly to simulate hygienic cleaning. VC for weight shifts when LE become tired.  BP: after standing 3min54sec 161/82 HR 92  06/15/2023: -Static stance for standing with RW; requiring Mod-MaxA for rise to stand d/t patient unsteady on feet posterior lean without proper righting strategy, SBA for stand   04/21/2023: Pt able to stand 60 sec with BUE on //bars with supervision.  Pt able to stand for 1 min with RW support with minA.  Eval on 03/23/2023: Patient maintained upright holding rolling walker with mod assist 2 people for safety for 30 seconds.  GAIT: 07/27/2023: Patient ambulated 44ft with narrow bathroom entry and 90deg turn to sit on commode CGA with minA and heavy vc for progression and sequencing of RW. While turning to sit on commode patient lost balance laterally requiring mod-maxA from therapist and patient use of grab bar to regain balance.   06/17/2023: Patient ambulated 7ft (maximal tolerated distance) with RW ModA (2 person for safety). Required VC for LE sequencing and minA to steer walker and prevent tip over.   04/21/2023: Pt amb 5' in //bars with modA (2 person for safety) with PT verbal & tactile cues on sequence and weight shift.  Pt amb 10' with RW with maxA (2 person for safety) with PT verbal & tactile cues on sequence and weight  shift.  Eval on 03/23/2023:  Patient took 2 steps (one per LE) with +2 max assist with rolling walker and TTA prosthesis.  Patient adducting prosthesis stepping on left foot with no awareness.  CURRENT PROSTHETIC WEAR ASSESSMENT: 06/17/2023:  Independence with prosthetic care:  Patient and husband are independent with: skin check, prosthetic cleaning, ply sock cleaning, proper wear schedule/adjustment Patient is dependent with: residual limb care, correct ply sock adjustment, and proper weight-bearing schedule/adjustment Donning prosthesis: husband is independent with proper donning. Patient requires MaxA. Patient can verbally direct someone how to properly assist. Doffing prosthesis: Husband is independent with proper donning. Patient requires modA. Patient can verbally direct someone how to properly assist. Prosthetic wear tolerance: majority of awake hours including dialysis hours, 1x/day, 3-4 days/week Prosthetic weight bearing tolerance: able to tolerate of standing without complains of prosthetic limb pain. Time not limited by limb discomfort but limited by muscle fatigue.   Eval on 03/23/2023:   Patient is dependent with: skin check, residual limb care, prosthetic cleaning, ply sock cleaning, correct ply sock adjustment, proper wear schedule/adjustment, and proper weight-bearing schedule/adjustment Donning prosthesis: Max A Doffing prosthesis: Min A Prosthetic wear tolerance: 2-6 hours, 1x/day, 3-4 days/week Prosthetic weight bearing tolerance: 3 minutes with limb pain Edema: pitting edema Residual limb condition: No open areas, dry skin, normal color and temperature, cylindrical shape Prosthetic description: Silicone liner with pin lock suspension, total contact socket with flexible inner socket, sach foot    TODAY'S TREATMENT:  DATE: 08/05/2023 Upon  arrival patient and husband stated that she was not feeling well today with patient describing back pain, abdominal pain, pain/burning with urinating, and is lethargic at times only aroused when tactile stimulation is applied.  Patient wanted to participate with physical therapy, once gait belt was applied therapist felt the patient was sweating though she had been sitting in the [facility] for almost an hr before starting therapy session. Pt temperature was taken reading 96.4 however patient had red cheeks, sweating, and increased lethargy. She reported taking tylenol prior to coming to PT.  Patient initially did not tell us that she was advised yesterday at dialysis that she should go to the ED due to symptoms. Patient instructed on urgency of receiving medical treatment to prevent further progression of possible infection. Patient and husband agreeable to going directly to ED at termination of session.  And end of session patient was not oriented to day, requiring 3 cues to become oriented.   TREATMENT:                                                                                                                            DATE: 08/03/2023 Prosthetic training: -Patient ambulated 27ft around 1 obstacle + 12ft weaving between 2 obstacles to 90deg turn to sit in chair with use of RW CGA with intermittent close SBA, 1 instance of L lateral LOB with patient able to rectify under own power without therapist intervention- R foot crossed and stepped on L foot. With fatigue patient required CGA throughout as well as increased time for motor planning and sequencing.  -Patients prosthesis became twisted to the R, patient asked to rectify requiring verbal cuing and leading questions for patient to properly verbalize sequencing and putting on of prosthetic leg. Able to press pin release independently with 2 attempts, verbalized key points for lining up leg correctly before securing into socket.  -patient and husband  encouraged to continue to practice patient don/doff leg, ensuring clean liner with application of cortizone to sooth itchiness, as well as to continue HEP for core and back strengthening.  Self care:  -STSx3 with LE walk back; 1st bout ModA with heavy vc for sequencing 2nd and 3rd bout ModA without vc as patient was able to sequence LE without cuing.  -Stood with RW accepting posterior pertubation's applied at the hip to simulate hygenic cleaning; 1st bout 17min15sec 2nd bout 66min3sec.    TREATMENT:  DATE: 07/29/2023 Prosthetic training: Patient ambulated 38ft with use of RW CGA with instances of close SBA, with 2 180deg turns to sit in chair. Turns required mod-maxA with heavy cuing to prevent fall posteriorly. On second turn patient required therapist intervention to prevent fall. Patient required maxAx2 to rise from low chair without armrest however was able to lower into chair with use of RW with MinA. Patient able to rise to stand with RW and chair with armrest with Mod-minA with vc for cues to weight shift and offload LE in order to progress rise to standing. VC for sequencing of feet and remaining close to seated surface on approach to reduce amount of effort to take steps bwds  Theract: Seated trunk flexion/extension, L lateral and R lateral x5ea with vc for use of core and back strength, then use of 5lb weight in all directions Seated trunk rotation R and L with green tband resistance x5ea  Patient educated on performing core and back strengthening activities in order to help with balance and strength when walking upright in walker and preventing falls. Verbalized understanding.  TREATMENT:                                                                                                                            DATE: 07/27/2023 -Patient ambulated 78ft with narrow bathroom  entry and 90deg turn to sit on commode CGA with minA and heavy vc for progression and sequencing of RW. While turning to sit on commode patient lost balance laterally requiring mod-maxA from therapist and patient use of grab bar to regain balance.  -Stand>sit 18in toilet: modA due to low height and only 1 grab bar -Sit>stand 18in toilet: Mod-maxA with use of grab bar, wheelchair armrest (simulate 1 of armrests on raised toilet seat), and RW.  -Patient stood with RW SBA for 54sec before c/o lightheadedness and sitting down to toilet. Resisted nudges applied posteriorly to simulate hygienic cleaning. VC for weight shifts when LE become tired.  BP: after standing 43min54sec 161/82 HR 92 -Sit>stand from wc with RW: MinA with heavy vc for sequencing and offweighting LE -Stand>sit wc with RW: CGA with patient performing safe controlled descent -Patient ambulated 19ft CGA to wheelchair and required MaxA when turning due to posterior LOB requiring therapist assist to prevent fall   PATIENT EDUCATION: PATIENT EDUCATED ON FOLLOWING PROSTHETIC CARE: Education details: Use of stockinette under proximal liner to decrease itching sensation, no wounds presents to PT recommended not using Vive wear under liner Skin check, Prosthetic cleaning, Propper donning, and Proper wear schedule/adjustment Prosthetic wear tolerance: 3 hours 2x/day, 4 days/week Person educated: Patient and Spouse Education method: Explanation, Tactile cues, and Verbal cues Education comprehension: verbalized understanding, verbal cues required, tactile cues required, and needs further education  HOME EXERCISE PROGRAM: Access Code: [email or web address] URL: [email or web address] Date: 04/15/2023 Prepared by: Vladimir Faster  Exercises - Supine Bridge  - 1 x daily - 4 x weekly - 2 sets - 5  reps - 2 seconds hold - Supine Lower Trunk Rotation  - 1 x daily - 4 x weekly - 2 sets - 5 reps - 5 seconds hold - Supine Heel Slide with Strap  -  1 x daily - 4 x weekly - 2 sets - 5 reps - 2-3 seconds hold - Supine Hip Abduction  - 1 x daily - 4 x weekly - 2 sets - 5 reps - 2-3 seconds hold - Seated Hip Flexion Toward Target  - 1 x daily - 4 x weekly - 2 sets - 5 reps - 5 seconds hold - Seated Eccentric Abdominal Lean Back  - 1 x daily - 4 x weekly - 2 sets - 5 reps - 5 seconds hold - Seated Sidebending  - 1 x daily - 4 x weekly - 2 sets - 5 reps - 5 seconds hold - Seated Trunk Rotation with Crossed Arms  - 1 x daily - 4 x weekly - 2 sets - 5 reps - 5 seconds hold   ASSESSMENT:  CLINICAL IMPRESSION:  Patient presented with symptoms of possible UTI. She was not able to participate safely in PT today.  Pt advised to go to ED which was apparently also advised yesterday by dialysis staff.    OBJECTIVE IMPAIRMENTS: Abnormal gait, decreased activity tolerance, decreased balance, decreased endurance, decreased knowledge of condition, decreased knowledge of use of DME, decreased mobility, difficulty walking, decreased ROM, decreased strength, decreased safety awareness, postural dysfunction, prosthetic dependency , and pain.   ACTIVITY LIMITATIONS: standing, transfers, and locomotion level  PARTICIPATION LIMITATIONS: community activity, household mobility and dependency / burden of care on family  PERSONAL FACTORS: Age, Fitness, Past/current experiences, Time since onset of injury/illness/exacerbation, and 3+ comorbidities: see PMH  are also affecting patient's functional outcome.   REHAB POTENTIAL: Good  CLINICAL DECISION MAKING: Evolving/moderate complexity  EVALUATION COMPLEXITY: Moderate   GOALS: Goals reviewed with patient? Yes  SHORT TERM GOALS: Target date: 07/15/2023:  Patient & husband verbalize proper residual limb care and understanding for adjusting ply socks.  Baseline: SEE OBJECTIVE DATA Goal status:   MET 07/27/2023 2.  Patient able to sit to stand w/c to rolling walker with 1 person modA and maintain upright with RW  for 4 min with SBA to assist with toileting needs/hygienic cleaning.  Baseline: SEE OBJECTIVE DATA Goal status: partially met 07/08/2023  3. Patient ambulates 62ft including turning 90* to position to sit with RW & prosthesis with modA to navigate reaching bathroom in home.  Baseline: SEE OBJECTIVE DATA Goal status: partially met 07/08/2023   LONG TERM GOALS: Target date: 09/10/2023  Patient & husband demonstrates & verbalizes understanding of prosthetic care to enable safe utilization of prosthesis. Baseline: SEE OBJECTIVE DATA Goal status: ongoing 07/27/2023  Patient tolerates prosthesis wear >80% of awake hours on non-dialysis days and >50% on dialysis days without skin issues and limb pain </= 4/10. Baseline: SEE OBJECTIVE DATA Goal status: MET 06/15/2023  Scooting transfers with minA and sit to/from stand transfers with modA.  Baseline: SEE OBJECTIVE DATA Goal status: ongoing  07/27/2023  Patient ambulates >67ft including turning 90* to position to sit with prosthesis & RW with modA for household navigation.  Baseline: SEE OBJECTIVE DATA Goal status: ongoing  07/27/2023  Patient able to maintain static stance with RW support with SBA for 5 min to assist with hygienic cleaning.  Baseline: SEE OBJECTIVE DATA Goal status:  ongoing  07/27/2023   PLAN:  PT FREQUENCY: 2x/week  PT DURATION: 12 weeks  PLANNED INTERVENTIONS: 97164- PT Re-evaluation, 97110-Therapeutic exercises, 97530- Therapeutic activity, [id number]- Neuromuscular re-education, [phone]- Self Care, [id number]- Gait training, [phone]- Prosthetic training, Patient/Family education, Balance training, DME instructions, Therapeutic exercises, Therapeutic activity, Neuromuscular re-education, Gait training, and Self Care  PLAN FOR NEXT SESSION:  check on ED visit  / symptoms.  Continue turning/positioning with less assist and cuing from therapist, increase standing endurance with cuing for weight shifts to offload BLE, seated core strengthening  with resistance bands.     Beckett Maden, Berenice Oehlert, Student-PT 08/05/2023, 2:29 PM  This entire session of physical therapy was performed under the direct supervision of PT signing evaluation /treatment. PT reviewed note and agrees.   Vladimir Faster, PT, DPT 08/05/2023, 3:09 PM

## 2023-08-05 NOTE — ED Triage Notes (Signed)
 Pt reports that she went to dialysis yesterday. pt reporting burning with urination, lower back pain and lower abdominal pain. Pt is having trouble staying awake in triage.

## 2023-08-05 NOTE — H&P (Signed)
 History and Physical    Susan [name]:[id number] DOB: Aug 25, 1950 DOA: 08/05/2023  PCP: Donita Brooks, MD  Patient coming from: Home  I have personally briefly reviewed patient's old medical records in [facility]  Chief Complaint: Dysuria, abdominal and back pain  HPI: Susan [name] is a 73 y.o. female with medical history significant for ESRD on TTS HD, PAF on Eliquis, CAD s/p PCI, chronic HFrEF (EF 30-35%), insulin-dependent T2DM, HTN, HLD, hypothyroidism, anemia of chronic disease, s/p right BKA, depression/anxiety who presented to the ED for evaluation of dysuria with abdominal and back pain.  Patient reports developing new onset of dysuria, lower back pain, and suprapubic abdominal pain yesterday.  She says she still does make small amount of urine.  During dialysis she developed shaking chills and at that time was recommended to present to the ED for further evaluation however did not do so until today.  She has had some nausea but no emesis.  She denies chest pain, dyspnea.  Patient is s/p right BKA and has prosthesis and is working with PT but has been wheelchair dependent.  ED Course  Labs/Imaging on admission: I have personally reviewed following labs and imaging studies.  Initial vitals showed BP 76/41, pulse 88, RR 15, temp 98.1 F, SpO2 96% on room air.  Labs showed WBC 16.7, hemoglobin 9.8, platelets 255,000, sodium 129, potassium 2.8, bicarb 23, BUN 35, creatinine 4.13, serum glucose 188, AST 48, ALT 25, alk phos 138, total bilirubin 0.9, lipase 17, lactic acid 2.3 > 1.1.  UA showed negative nitrites, moderate leukocytes, 21-50 RBCs, >50 WBCs, many bacteria.  Urine culture in process.  Blood cultures in process.  Patient was given 500 cc normal saline, oral K 40 mEq, and IV ceftriaxone.  The hospitalist service was consulted to admit.  Review of Systems: All systems reviewed and are negative except as documented in history of present illness above.   Past  Medical History:  Diagnosis Date   Anemia    hx   Anxiety    Arthritis    "generalized" (03/15/2014)   CAD (coronary artery disease)    MI in 2000 - MI  2007 - treated bare metal stent (no nuclear since then as 9/11)   Carotid artery disease (HCC)    Chronic diastolic heart failure (HCC)    a) ECHO (08/2013) EF 55-60% and RV function nl b) RHC (08/2013) RA 4, RV 30/5/7, PA 25/10 (16), PCWP 7, Fick CO/CI 6.3/2.7, PVR 1.5 WU, PA 61 and 66%   Daily headache    "~ every other day; since I fell in June" (03/15/2014)   Depression    Diabetic retinopathy (HCC)    Dyslipidemia    ESRD (end stage renal disease) (HCC)    Dialysis on Tues Thurs Sat   Exertional shortness of breath    GERD (gastroesophageal reflux disease)    History of blood transfusion    History of kidney stones    HTN (hypertension)    Hypothyroidism    Myocardial infarction (HCC)    Obesity    Osteoarthritis    PAF (paroxysmal atrial fibrillation) (HCC)    Peripheral neuropathy    bilateral feet/hands   PONV (postoperative nausea and vomiting)    RBBB (right bundle branch block)    Old   Stroke (HCC)    mini strokes   Type II diabetes mellitus (HCC)    Type II, Susan [name] libre left upper arm. patient has omnipod insulin pump with Novolin  R Insulin    Past Surgical History:  Procedure Laterality Date   A/V FISTULAGRAM Left 11/07/2022   Procedure: A/V Fistulagram;  Surgeon: Leonie Douglas, MD;  [facility];  Service: Cardiovascular;  Laterality: Left;   ABDOMINAL HYSTERECTOMY  1980's   AMPUTATION Right 02/24/2018   Procedure: RIGHT FOOT GREAT TOE AND 2ND TOE AMPUTATION;  Surgeon: Nadara Mustard, MD;  Location: [facility] OR;  Service: Orthopedics;  Laterality: Right;   AMPUTATION Right 04/30/2018   Procedure: RIGHT TRANSMETATARSAL AMPUTATION;  Surgeon: Nadara Mustard, MD;  Location: [facility] OR;  Service: Orthopedics;  Laterality: Right;   AMPUTATION Right 05/02/2022   Procedure: RIGHT BELOW KNEE AMPUTATION;   Surgeon: Nadara Mustard, MD;  Location: [facility] OR;  Service: Orthopedics;  Laterality: Right;   APPLICATION OF WOUND VAC Right 06/13/2022   Procedure: APPLICATION OF WOUND VAC;  Surgeon: Nadara Mustard, MD;  Location: [facility] OR;  Service: Orthopedics;  Laterality: Right;   APPLICATION OF WOUND VAC Left 11/14/2022   Procedure: APPLICATION OF WOUND VAC;  Surgeon: Nadara Mustard, MD;  [facility]: [facility] OR;  Service: Orthopedics;  Laterality: Left;   AV FISTULA PLACEMENT Left 04/02/2022   Procedure: LEFT ARM ARTERIOVENOUS (AV) FISTULA CREATION;  Surgeon: Nada Libman, MD;  [facility]: [facility];  Service: Vascular;  Laterality: Left;  PERIPHERAL NERVE BLOCK   AV FISTULA PLACEMENT Right 04/27/2023   Procedure: RIGHT ARM BRACHIOBASILIC ARTERIOVENOUS (AV) FISTULA CREATION;  Surgeon: Leonie Douglas, MD;  [facility]: [facility] OR;  Service: Vascular;  Laterality: Right;   BASCILIC VEIN TRANSPOSITION Left 07/31/2022   Procedure: LEFT ARM SECOND STAGE BASILIC VEIN TRANSPOSITION;  Surgeon: Nada Libman, MD;  [facility];  Service: Vascular;  Laterality: Left;   BIOPSY  05/27/2020   Procedure: BIOPSY;  Surgeon: Lanelle Bal, DO;  [facility]: [facility];  Service: Endoscopy;;   CATARACT EXTRACTION, BILATERAL Bilateral ?2013   COLONOSCOPY W/ POLYPECTOMY     COLONOSCOPY WITH PROPOFOL N/A 03/13/2019   Procedure: COLONOSCOPY WITH PROPOFOL;  Surgeon: Beverley Fiedler, MD;  Location: [facility] ENDOSCOPY;  Service: Gastroenterology;  Laterality: N/A;   CORONARY ANGIOPLASTY WITH STENT PLACEMENT  1999; 2007   "1 + 1"   ERCP N/A 02/03/2022   Procedure: ENDOSCOPIC RETROGRADE CHOLANGIOPANCREATOGRAPHY (ERCP);  Surgeon: Jeani Hawking, MD;  Location: [facility] ENDOSCOPY;  Service: Gastroenterology;  Laterality: N/A;   ESOPHAGOGASTRODUODENOSCOPY N/A 02/12/2023   Procedure: ESOPHAGOGASTRODUODENOSCOPY (EGD);  Surgeon: Jeani Hawking, MD;  Location: [facility] ENDOSCOPY;  Service: Gastroenterology;  Laterality: N/A;   ESOPHAGOGASTRODUODENOSCOPY (EGD) WITH PROPOFOL N/A  03/13/2019   Procedure: ESOPHAGOGASTRODUODENOSCOPY (EGD) WITH PROPOFOL;  Surgeon: Beverley Fiedler, MD;  Location: [facility] ENDOSCOPY;  Service: Gastroenterology;  Laterality: N/A;   ESOPHAGOGASTRODUODENOSCOPY (EGD) WITH PROPOFOL N/A 05/27/2020   Procedure: ESOPHAGOGASTRODUODENOSCOPY (EGD) WITH PROPOFOL;  Surgeon: Lanelle Bal, DO;  [facility];  Service: Endoscopy;  Laterality: N/A;   ESOPHAGOGASTRODUODENOSCOPY (EGD) WITH PROPOFOL N/A 09/03/2022   Procedure: ESOPHAGOGASTRODUODENOSCOPY (EGD) WITH PROPOFOL;  Surgeon: Jeani Hawking, MD;  Location: [facility] ENDOSCOPY;  Service: Gastroenterology;  Laterality: N/A;   EYE SURGERY Bilateral    lazer   FLEXIBLE SIGMOIDOSCOPY N/A 05/23/2022   Procedure: FLEXIBLE SIGMOIDOSCOPY;  Surgeon: Jeani Hawking, MD;  Location: [facility] ENDOSCOPY;  Service: Gastroenterology;  Laterality: N/A;   FLEXIBLE SIGMOIDOSCOPY N/A 05/24/2022   Procedure: FLEXIBLE SIGMOIDOSCOPY;  Surgeon: Imogene Burn, MD;  Location: [facility] ENDOSCOPY;  Service: Gastroenterology;  Laterality: N/A;   FLEXIBLE SIGMOIDOSCOPY N/A 09/03/2022   Procedure: FLEXIBLE SIGMOIDOSCOPY;  Surgeon: Jeani Hawking, MD;  Location: [facility] ENDOSCOPY;  Service: Gastroenterology;  Laterality: N/A;   HEMOSTASIS CLIP PLACEMENT  03/13/2019   Procedure: HEMOSTASIS CLIP PLACEMENT;  Surgeon: Beverley Fiedler, MD;  Location: [facility] ENDOSCOPY;  Service: Gastroenterology;;   HEMOSTASIS CLIP PLACEMENT  05/23/2022   Procedure: HEMOSTASIS CLIP PLACEMENT;  Surgeon: Jeani Hawking, MD;  Location: [facility] ENDOSCOPY;  Service: Gastroenterology;;   HEMOSTASIS CONTROL  05/24/2022   Procedure: HEMOSTASIS CONTROL;  Surgeon: Imogene Burn, MD;  Location: [facility] ENDOSCOPY;  Service: Gastroenterology;;   HOT HEMOSTASIS N/A 05/23/2022   Procedure: HOT HEMOSTASIS (ARGON PLASMA COAGULATION/BICAP);  Surgeon: Jeani Hawking, MD;  Location: [facility] ENDOSCOPY;  Service: Gastroenterology;  Laterality: N/A;   I & D EXTREMITY Left 05/05/2022   Procedure: IRRIGATION AND DEBRIDEMENT  LEFT ARM AV FISTULA;  Surgeon: Cephus Shelling, MD;  Location: [facility] OR;  Service: Vascular;  Laterality: Left;   I & D EXTREMITY N/A 11/14/2022   Procedure: IRRIGATION AND DEBRIDEMENT OF LOWER EXTREMITY WOUND;  Surgeon: Nadara Mustard, MD;  [facility];  Service: Orthopedics;  Laterality: N/A;   INSERTION OF DIALYSIS CATHETER Right 04/02/2022   Procedure: INSERTION OF TUNNELED DIALYSIS CATHETER;  Surgeon: Nada Libman, MD;  Location: [facility] OR;  Service: Vascular;  Laterality: Right;   KNEE ARTHROSCOPY Left 10/25/2006   POLYPECTOMY  03/13/2019   Procedure: POLYPECTOMY;  Surgeon: Beverley Fiedler, MD;  [facility];  Service: Gastroenterology;;   REMOVAL OF STONES  02/03/2022   Procedure: REMOVAL OF STONES;  Surgeon: Jeani Hawking, MD;  Location: [facility] ENDOSCOPY;  Service: Gastroenterology;;   REVISON OF ARTERIOVENOUS FISTULA Left 08/20/2022   Procedure: REVISON OF LEFT ARM ARTERIOVENOUS FISTULA;  Surgeon: Nada Libman, MD;  [facility];  Service: Vascular;  Laterality: Left;   RIGHT HEART CATH N/A 07/24/2017   Procedure: RIGHT HEART CATH;  Surgeon: Dolores Patty, MD;  [facility];  Service: Cardiovascular;  Laterality: N/A;   RIGHT HEART CATHETERIZATION N/A 09/22/2013   Procedure: RIGHT HEART CATH;  Surgeon: Dolores Patty, MD;  Location: [facility] CATH LAB;  Service: Cardiovascular;  Laterality: N/A;   SHOULDER ARTHROSCOPY WITH OPEN ROTATOR CUFF REPAIR Right 03/14/2014   Procedure: RIGHT SHOULDER ARTHROSCOPY WITH BICEPS RELEASE, OPEN SUBSCAPULA REPAIR, OPEN SUPRASPINATUS REPAIR.;  Surgeon: Cammy Copa, MD;  Location: [facility] OR;  Service: Orthopedics;  Laterality: Right;   SPHINCTEROTOMY  02/03/2022   Procedure: SPHINCTEROTOMY;  Surgeon: Jeani Hawking, MD;  Location: [facility] ENDOSCOPY;  Service: Gastroenterology;;   STUMP REVISION Right 06/13/2022   Procedure: REVISION RIGHT BELOW KNEE AMPUTATION;  Surgeon: Nadara Mustard, MD;  Location: [facility] OR;  Service: Orthopedics;   Laterality: Right;   TEE WITHOUT CARDIOVERSION N/A 02/04/2022   Procedure: TRANSESOPHAGEAL ECHOCARDIOGRAM (TEE);  Surgeon: Dolores Patty, MD;  Location: [facility] ENDOSCOPY;  Service: Cardiovascular;  Laterality: N/A;   THROMBECTOMY W/ EMBOLECTOMY Left 08/20/2022   Procedure: THROMBECTOMY OF LEFT ARM ARTERIOVENOUS FISTULA;  Surgeon: Nada Libman, MD;  Location: [facility] OR;  Service: Vascular;  Laterality: Left;   TOE AMPUTATION Right 02/24/2018   GREAT TOE AND 2ND TOE AMPUTATION   TUBAL LIGATION  1970's   UPPER EXTREMITY VENOGRAPHY N/A 07/31/2023   Procedure: UPPER EXTREMITY VENOGRAPHY;  Surgeon: Leonie Douglas, MD;  [facility];  Service: Cardiovascular;  Laterality: N/A;    Social History: Social History   Tobacco Use   Smoking status: Former    Current packs/day: 0.00    Average packs/day: 3.0  packs/day for 32.0 years (96.0 ttl pk-yrs)    Types: Cigarettes    Start date: 10/24/1965    Quit date: 10/24/1997    Years since quitting: 25.7   Smokeless tobacco: Never  Vaping Use   Vaping status: Never Used  Substance Use Topics   Alcohol use: Not Currently   Drug use: No     Allergies  Allergen Reactions   Cephalexin Diarrhea and Other (See Comments)   Codeine Nausea And Vomiting and Other (See Comments)    Family History  Problem Relation Age of Onset   Heart attack Mother 80     Prior to Admission medications   Medication Sig Start Date End Date Taking? Authorizing Provider  acetaminophen (TYLENOL) 325 MG tablet Take 2 tablets (650 mg total) by mouth every 6 (six) hours as needed for mild pain (or Fever >/= 101). 12/10/22   Angiulli, Mcarthur Rossetti, PA-C  albuterol (VENTOLIN HFA) 108 (90 Base) MCG/ACT inhaler Inhale 1-2 puffs into the lungs every 6 (six) hours as needed for shortness of breath.    [provider]  amiodarone (PACERONE) 200 MG tablet TAKE 1 TABLET BY MOUTH EVERY DAY 07/17/23   Wendall Stade, MD  apixaban (ELIQUIS) 5 MG TABS tablet Take 1  tablet (5 mg total) by mouth 2 (two) times daily. 03/27/23   Gaston Islam., NP  ascorbic acid (VITAMIN C) 500 MG tablet Take 1 tablet (500 mg total) by mouth daily. 12/11/22   Angiulli, Mcarthur Rossetti, PA-C  atorvastatin (LIPITOR) 80 MG tablet TAKE 1 TABLET BY MOUTH EVERY DAY 07/06/23   Bensimhon, Bevelyn Buckles, MD  benzonatate (TESSALON PERLES) 100 MG capsule Take 1 capsule (100 mg total) by mouth 3 (three) times daily as needed for cough. Patient not taking: Reported on 07/29/2023 06/17/23   Bernadette Hoit, MD  carvedilol (COREG) 6.25 MG tablet Take 6.25 mg by mouth at bedtime. Take on Mon/Wed/Fri -non Dialysis days 08/11/22   [provider]  clonazePAM (KLONOPIN) 0.5 MG disintegrating tablet Take 1 tablet (0.5 mg total) by mouth 2 (two) times daily. 06/30/23   Donita Brooks, MD  folic acid (FOLVITE) 1 MG tablet Take 1 mg by mouth daily. 04/08/23   [provider]  folic acid-vitamin b complex-vitamin c-selenium-zinc (DIALYVITE) 3 MG TABS tablet Take 1 tablet by mouth daily.    [provider]  insulin aspart (NOVOLOG) 100 UNIT/ML injection Inject 0-15 Units into the skin 3 (three) times daily with meals. CBG < 70: treat low blood sugar, CBG 70 - 120: 0 units, CBG 121 - 150: 2 units, CBG 151 - 200: 3 units, CBG 201 - 250: 5 units, CBG 251 - 300: 8 units, CBG 301 - 350: 11 units, CBG 351 - 400: 15 units, CBG > 400: call MD 03/04/23   Donita Brooks, MD  insulin glargine (LANTUS) 100 UNIT/ML Solostar Pen Inject 26 Units into the skin daily. Patient taking differently: Inject 31 Units into the skin every morning. 02/02/23   Zigmund Daniel., MD  levothyroxine (SYNTHROID) 75 MCG tablet Take 1 tablet (75 mcg total) by mouth daily before breakfast. Patient taking differently: Take 50 mcg by mouth daily before breakfast. 12/11/22   Angiulli, Mcarthur Rossetti, PA-C  LORazepam (ATIVAN) 0.5 MG tablet TAKE 1 TABLET BY MOUTH AT BEDTIME. 08/04/23   Donita Brooks, MD  melatonin 3 MG TABS tablet  Take 3 mg by mouth at bedtime.    [provider]  methocarbamol (ROBAXIN) 750  MG tablet TAKE 1 TABLET (750 MG TOTAL) BY MOUTH EVERY 6 (SIX) HOURS AS NEEDED FOR MUSCLE SPASMS 07/27/23   Donita Brooks, MD  Methoxy PEG-Epoetin Beta (MIRCERA IJ) Mircera 02/05/23 02/04/24  [provider]  midodrine (PROAMATINE) 10 MG tablet Take 1 tablet (10 mg total) by mouth daily. Patient taking differently: Take 10-20 mg by mouth Every Tuesday,Thursday,and Saturday with dialysis. 04/10/23   Donita Brooks, MD  nitroGLYCERIN (NITROSTAT) 0.4 MG SL tablet Place 1 tablet (0.4 mg total) under the tongue every 5 (five) minutes as needed. 03/27/23 07/29/23  Gaston Islam., NP  OXYGEN Inhale 2 L into the lungs at bedtime.    [provider]  pantoprazole (PROTONIX) 40 MG tablet TAKE 1 TABLET (40 MG TOTAL) BY MOUTH TWICE A DAY BEFORE MEALS 07/06/23   Donita Brooks, MD  polyethylene glycol (MIRALAX / GLYCOLAX) 17 g packet Take 17 g by mouth daily. Patient taking differently: Take 17 g by mouth daily as needed for mild constipation or moderate constipation. 12/10/22   Angiulli, Mcarthur Rossetti, PA-C  prochlorperazine (COMPAZINE) 5 MG tablet Take 1 tablet (5 mg total) by mouth every 8 (eight) hours as needed for nausea or vomiting. 03/03/23 07/31/23  Donita Brooks, MD  sevelamer carbonate (RENVELA) 800 MG tablet Take 1,600 mg by mouth 3 (three) times daily with meals.    [provider]  triamcinolone cream (KENALOG) 0.1 % Apply 1 Application topically 2 (two) times daily. Apply to affected areas BID Patient not taking: Reported on 07/29/2023 06/17/23   Bernadette Hoit, MD  venlafaxine XR (EFFEXOR-XR) 75 MG 24 hr capsule TAKE 1 CAPSULE (75 MG TOTAL) BY MOUTH DAILY WITH BREAKFAST. STOP TRINTELLIX 07/06/23   Donita Brooks, MD  Zinc 30 MG CAPS Take 30 mg by mouth daily.    [provider]    Physical Exam: Vitals:   08/05/23 2130 08/05/23 2145 08/05/23 2155 08/05/23 2300  BP: (!)  104/53 (!) 105/54  (!) 107/55  Pulse: 90 90  91  Resp: 14 14  15   Temp:   98.6 F (37 C)   TempSrc:   Oral   SpO2: 95% 94%  100%  Weight:      Height:       Constitutional: Resting in bed, NAD, calm, comfortable Eyes: EOMI, lids and conjunctivae normal ENMT: Mucous membranes are moist. Posterior pharynx clear of any exudate or lesions.Normal dentition.  Neck: normal, supple, no masses. Respiratory: clear to auscultation bilaterally, no wheezing, no crackles. Normal respiratory effort. No accessory muscle use.  Cardiovascular: Regular rate and rhythm, systolic murmur present. No extremity edema.  Right chest TDC in place Abdomen: Suprapubic tenderness, no masses palpated. Musculoskeletal: S/p right BKA.  No clubbing / cyanosis. Good ROM Skin: no rashes, lesions, ulcers. No induration Neurologic: Sensation intact. Strength 5/5 in all 4.  Psychiatric: Normal judgment and insight. Alert and oriented x 3. Normal mood.   EKG: Personally reviewed. Sinus rhythm, rate 90, RBBB.  Rate is faster when compared to previous.  Assessment/Plan Principal Problem:   Complicated UTI (urinary tract infection) Active Problems:   Hypokalemia   Type 2 diabetes mellitus with diabetic polyneuropathy, with long-term current use of insulin (HCC)   ESRD (end stage renal disease) (HCC)   Paroxysmal atrial fibrillation (HCC)   Anemia of chronic disease   Hypothyroidism   Hyperlipemia   Coronary artery disease   MDD (major depressive disorder), recurrent episode, moderate (HCC)   Hyponatremia   Chronic HFrEF (  heart failure with reduced ejection fraction) (HCC)   Susan [name] is a 73 y.o. female with medical history significant for ESRD on TTS HD, PAF on Eliquis, CAD s/p PCI, chronic HFrEF (EF 30-35%), insulin-dependent T2DM, HTN, HLD, hypothyroidism, anemia of chronic disease, s/p right BKA, depression/anxiety who is admitted with complicated UTI.  Assessment and Plan: Complicated UTI: Hypotensive on  arrival, BP improved with IV fluids.  UA consistent with UTI.  Blood and urine cultures in process.  CT showed small amount of air in the bladder and proximal left ureter which could be related to infection or emphysematous pyelitis. -Continue IV ceftriaxone -Follow blood and urine cultures  Hypokalemia: Supplementing, avoiding aggressive repletion in setting of ESRD.  Hyponatremia: Sodium 129 on admission.  Received 500 cc NS in the ED.  Recheck labs in AM.  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  Will need nephrology consult for routine HD tomorrow if not discharged early.  Continue midodrine on dialysis days.  Paroxysmal atrial fibrillation: Stable.  Continue amiodarone and Eliquis.  Chronic HFrEF: Stable, volume control with dialysis.  Coronary artery disease: Stable.  Not on antiplatelets since she is on full dose anticoagulation.  Continue Eliquis and atorvastatin.  Anemia of CKD: Hemoglobin stable, continue monitor.  Insulin-dependent type 2 diabetes: Placed on SSI and Semglee 15 units daily.  Hypothyroidism: Continue Synthroid.  Hyperlipidemia: Continue atorvastatin.  Depression/anxiety: Continue Effexor and home clonazepam.  Med rec and PDMP also noted that she is prescribed lorazepam, will need to clarify with patient.   DVT prophylaxis:  apixaban (ELIQUIS) tablet 5 mg   Code Status:   Code Status: Full Code  Family Communication: Discussed with patient, she has discussed with family Disposition Plan: From home and likely discharge to home pending clinical progress Consults called: None Severity of Illness: The appropriate patient status for this patient is OBSERVATION. Observation status is judged to be reasonable and necessary in order to provide the required intensity of service to ensure the patient's safety. The patient's presenting symptoms, physical exam findings, and initial radiographic and laboratory data in the context of their medical  condition is felt to place them at decreased risk for further clinical deterioration. Furthermore, it is anticipated that the patient will be medically stable for discharge from the hospital within 2 midnights of admission.   Darreld Mclean MD [facility]  If 7PM-7AM, please contact night-coverage [email or web address]  08/05/2023, 11:21 PM

## 2023-08-05 NOTE — Hospital Course (Signed)
 Susan [name] is a 73 y.o. female with medical history significant for ESRD on TTS HD, PAF on Eliquis, CAD s/p PCI, chronic HFrEF (EF 30-35%), insulin-dependent T2DM, HTN, HLD, hypothyroidism, anemia of chronic disease, s/p right BKA, depression/anxiety who is admitted with complicated UTI.

## 2023-08-06 ENCOUNTER — Encounter (HOSPITAL_COMMUNITY): Payer: Self-pay | Admitting: Internal Medicine

## 2023-08-06 DIAGNOSIS — I480 Paroxysmal atrial fibrillation: Secondary | ICD-10-CM | POA: Diagnosis not present

## 2023-08-06 DIAGNOSIS — Z794 Long term (current) use of insulin: Secondary | ICD-10-CM | POA: Diagnosis not present

## 2023-08-06 DIAGNOSIS — E861 Hypovolemia: Secondary | ICD-10-CM | POA: Diagnosis not present

## 2023-08-06 DIAGNOSIS — E871 Hypo-osmolality and hyponatremia: Secondary | ICD-10-CM | POA: Diagnosis not present

## 2023-08-06 DIAGNOSIS — D631 Anemia in chronic kidney disease: Secondary | ICD-10-CM | POA: Diagnosis not present

## 2023-08-06 DIAGNOSIS — R590 Localized enlarged lymph nodes: Secondary | ICD-10-CM | POA: Diagnosis not present

## 2023-08-06 DIAGNOSIS — A4151 Sepsis due to Escherichia coli [E. coli]: Secondary | ICD-10-CM | POA: Diagnosis not present

## 2023-08-06 DIAGNOSIS — E039 Hypothyroidism, unspecified: Secondary | ICD-10-CM | POA: Diagnosis not present

## 2023-08-06 DIAGNOSIS — R0602 Shortness of breath: Secondary | ICD-10-CM | POA: Diagnosis not present

## 2023-08-06 DIAGNOSIS — Z1612 Extended spectrum beta lactamase (ESBL) resistance: Secondary | ICD-10-CM | POA: Diagnosis not present

## 2023-08-06 DIAGNOSIS — I2510 Atherosclerotic heart disease of native coronary artery without angina pectoris: Secondary | ICD-10-CM | POA: Diagnosis not present

## 2023-08-06 DIAGNOSIS — I120 Hypertensive chronic kidney disease with stage 5 chronic kidney disease or end stage renal disease: Secondary | ICD-10-CM | POA: Diagnosis not present

## 2023-08-06 DIAGNOSIS — N12 Tubulo-interstitial nephritis, not specified as acute or chronic: Secondary | ICD-10-CM | POA: Diagnosis present

## 2023-08-06 DIAGNOSIS — F331 Major depressive disorder, recurrent, moderate: Secondary | ICD-10-CM | POA: Diagnosis not present

## 2023-08-06 DIAGNOSIS — E785 Hyperlipidemia, unspecified: Secondary | ICD-10-CM | POA: Diagnosis not present

## 2023-08-06 DIAGNOSIS — Z992 Dependence on renal dialysis: Secondary | ICD-10-CM | POA: Diagnosis not present

## 2023-08-06 DIAGNOSIS — N186 End stage renal disease: Secondary | ICD-10-CM | POA: Diagnosis not present

## 2023-08-06 DIAGNOSIS — K8020 Calculus of gallbladder without cholecystitis without obstruction: Secondary | ICD-10-CM | POA: Diagnosis not present

## 2023-08-06 DIAGNOSIS — N390 Urinary tract infection, site not specified: Secondary | ICD-10-CM | POA: Diagnosis not present

## 2023-08-06 DIAGNOSIS — N1330 Unspecified hydronephrosis: Secondary | ICD-10-CM | POA: Diagnosis not present

## 2023-08-06 DIAGNOSIS — I953 Hypotension of hemodialysis: Secondary | ICD-10-CM | POA: Diagnosis not present

## 2023-08-06 DIAGNOSIS — E876 Hypokalemia: Secondary | ICD-10-CM | POA: Diagnosis not present

## 2023-08-06 DIAGNOSIS — Z89511 Acquired absence of right leg below knee: Secondary | ICD-10-CM | POA: Diagnosis not present

## 2023-08-06 DIAGNOSIS — Z6838 Body mass index (BMI) 38.0-38.9, adult: Secondary | ICD-10-CM | POA: Diagnosis not present

## 2023-08-06 DIAGNOSIS — E1142 Type 2 diabetes mellitus with diabetic polyneuropathy: Secondary | ICD-10-CM | POA: Diagnosis not present

## 2023-08-06 DIAGNOSIS — I5020 Unspecified systolic (congestive) heart failure: Secondary | ICD-10-CM | POA: Diagnosis not present

## 2023-08-06 DIAGNOSIS — I132 Hypertensive heart and chronic kidney disease with heart failure and with stage 5 chronic kidney disease, or end stage renal disease: Secondary | ICD-10-CM | POA: Diagnosis not present

## 2023-08-06 DIAGNOSIS — R1030 Lower abdominal pain, unspecified: Secondary | ICD-10-CM | POA: Diagnosis not present

## 2023-08-06 DIAGNOSIS — E1122 Type 2 diabetes mellitus with diabetic chronic kidney disease: Secondary | ICD-10-CM | POA: Diagnosis not present

## 2023-08-06 DIAGNOSIS — J9811 Atelectasis: Secondary | ICD-10-CM | POA: Diagnosis not present

## 2023-08-06 DIAGNOSIS — N2581 Secondary hyperparathyroidism of renal origin: Secondary | ICD-10-CM | POA: Diagnosis not present

## 2023-08-06 DIAGNOSIS — I5022 Chronic systolic (congestive) heart failure: Secondary | ICD-10-CM | POA: Diagnosis not present

## 2023-08-06 LAB — BASIC METABOLIC PANEL
Anion gap: 17 — ABNORMAL HIGH (ref 5–15)
BUN: 41 mg/dL — ABNORMAL HIGH (ref 8–23)
CO2: 22 mmol/L (ref 22–32)
Calcium: 9 mg/dL (ref 8.9–10.3)
Chloride: 91 mmol/L — ABNORMAL LOW (ref 98–111)
Creatinine, Ser: 4.63 mg/dL — ABNORMAL HIGH (ref 0.44–1.00)
GFR, Estimated: 9 mL/min — ABNORMAL LOW (ref >60)
Glucose, Bld: 130 mg/dL — ABNORMAL HIGH (ref 70–99)
Potassium: 4 mmol/L (ref 3.5–5.1)
Sodium: 130 mmol/L — ABNORMAL LOW (ref 135–145)

## 2023-08-06 LAB — BLOOD CULTURE ID PANEL (REFLEXED) - BCID2
A.calcoaceticus-baumannii: NOT DETECTED
Bacteroides fragilis: NOT DETECTED
CTX-M ESBL: NOT DETECTED
Candida albicans: NOT DETECTED
Candida auris: NOT DETECTED
Candida glabrata: NOT DETECTED
Candida krusei: NOT DETECTED
Candida parapsilosis: NOT DETECTED
Candida tropicalis: NOT DETECTED
Carbapenem resist OXA 48 LIKE: NOT DETECTED
Carbapenem resistance IMP: NOT DETECTED
Carbapenem resistance KPC: NOT DETECTED
Carbapenem resistance NDM: NOT DETECTED
Carbapenem resistance VIM: NOT DETECTED
Cryptococcus neoformans/gattii: NOT DETECTED
Enterobacter cloacae complex: NOT DETECTED
Enterobacterales: DETECTED — AB
Enterococcus Faecium: NOT DETECTED
Enterococcus faecalis: NOT DETECTED
Escherichia coli: DETECTED — AB
Haemophilus influenzae: NOT DETECTED
Klebsiella aerogenes: NOT DETECTED
Klebsiella oxytoca: NOT DETECTED
Klebsiella pneumoniae: NOT DETECTED
Listeria monocytogenes: NOT DETECTED
Neisseria meningitidis: NOT DETECTED
Proteus species: NOT DETECTED
Pseudomonas aeruginosa: NOT DETECTED
Salmonella species: NOT DETECTED
Serratia marcescens: NOT DETECTED
Staphylococcus aureus (BCID): NOT DETECTED
Staphylococcus epidermidis: NOT DETECTED
Staphylococcus lugdunensis: NOT DETECTED
Staphylococcus species: NOT DETECTED
Stenotrophomonas maltophilia: NOT DETECTED
Streptococcus agalactiae: NOT DETECTED
Streptococcus pneumoniae: NOT DETECTED
Streptococcus pyogenes: NOT DETECTED
Streptococcus species: NOT DETECTED

## 2023-08-06 LAB — GLUCOSE, CAPILLARY
Glucose-Capillary: 121 mg/dL — ABNORMAL HIGH (ref 70–99)
Glucose-Capillary: 121 mg/dL — ABNORMAL HIGH (ref 70–99)
Glucose-Capillary: 119 mg/dL — ABNORMAL HIGH (ref 70–99)
Glucose-Capillary: 146 mg/dL — ABNORMAL HIGH (ref 70–99)

## 2023-08-06 LAB — CBC
HCT: 29.7 % — ABNORMAL LOW (ref 36.0–46.0)
Hemoglobin: 9.7 g/dL — ABNORMAL LOW (ref 12.0–15.0)
MCH: 30 pg (ref 26.0–34.0)
MCHC: 32.7 g/dL (ref 30.0–36.0)
MCV: 92 fL (ref 80.0–100.0)
Platelets: 221 10*3/uL (ref 150–400)
RBC: 3.23 MIL/uL — ABNORMAL LOW (ref 3.87–5.11)
RDW: 15 % (ref 11.5–15.5)
WBC: 13.7 10*3/uL — ABNORMAL HIGH (ref 4.0–10.5)
nRBC: 0 % (ref 0.0–0.2)

## 2023-08-06 LAB — HEPATITIS B SURFACE ANTIGEN: Hepatitis B Surface Ag: NONREACTIVE

## 2023-08-06 MED ORDER — RENA-VITE PO TABS
1.0000 | ORAL_TABLET | Freq: Every day | ORAL | Status: DC
  Administered 2023-08-07 – 2023-08-12 (×7): 1 via ORAL
  Filled 2023-08-06 (×7): qty 1

## 2023-08-06 MED ORDER — SODIUM CHLORIDE 0.9 % IV SOLN
2.0000 g | INTRAVENOUS | Status: DC
  Administered 2023-08-06: 2 g via INTRAVENOUS
  Filled 2023-08-06 (×2): qty 20

## 2023-08-06 MED ORDER — ALBUTEROL SULFATE (2.5 MG/3ML) 0.083% IN NEBU
3.0000 mL | INHALATION_SOLUTION | Freq: Four times a day (QID) | RESPIRATORY_TRACT | Status: DC | PRN
  Administered 2023-08-10: 3 mL via RESPIRATORY_TRACT
  Filled 2023-08-06: qty 3

## 2023-08-06 MED ORDER — CHLORHEXIDINE GLUCONATE CLOTH 2 % EX PADS
6.0000 | MEDICATED_PAD | Freq: Every day | CUTANEOUS | Status: DC
  Administered 2023-08-07 – 2023-08-10 (×4): 6 via TOPICAL

## 2023-08-06 NOTE — Progress Notes (Signed)
 New Admission Note:   Arrival Method: stretcher Mental Orientation: alert and x4 Telemetry: none Assessment: Completed Skin: see flow sheet IV:NSL Pain: none Tubes: none Safety Measures: Safety Fall Prevention Plan has been discussed Admission: Completed [facility] Orientation: Patient has been orientated to the room, unit and staff.  Family: none  Orders have been reviewed and implemented. Will continue to monitor the patient. Call light has been placed within reach and bed alarm has been activated.   Artemio Aly BSN, RN Phone number: [phone]

## 2023-08-06 NOTE — Progress Notes (Signed)
 Susan [name]  NGE:[id number] DOB: 06-Jan-1951 DOA: 08/05/2023 PCP: Donita Brooks, MD    Brief Narrative:  73 year old with a history of ESRD on HD TTS, PAF on Eliquis, CAD status post PCI, chronic systolic CHF with EF 30-35%, DM2, HTN, HLD, hypothyroidism, anemia of CKD, right BKA, and depression/anxiety who presented to the ER with 24 hours of dysuria associated with abdominal and back pain as well as intermittent shaking chills.  In the ER her blood pressure was 76/41 with a temperature of 98.1 and a WBC of 16.7.  UA was suggestive of UTI.  Goals of Care:   Code Status: Full Code   DVT prophylaxis:  apixaban (ELIQUIS) tablet 5 mg   Interim Hx: Afebrile since admission.  Vital signs presently stable.  Patient tells me she feels "terrible."  No specific localizing symptoms but has a persisting low-grade nausea and feels very weak in general with poor appetite.  No chest pain or shortness of breath.  Assessment & Plan:  Complicated E. coli UTI - E. 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 -continue empiric antibiotic  Hypotension Improved with simple volume resuscitation  Hypokalemia Corrected with gentle replacement -avoid further supplementation and patient with ESRD  Hyponatremia Hypovolemic in nature -follow trend with careful volume expansion  ESRD on HD TTS Nephrology to attend to dialysis needs  PAF on chronic Eliquis Heart rate controlled -continue DOAC  Chronic systolic congestive heart failure No acute exacerbation with patient actually dehydrated at presentation -volume control per dialysis  CAD Quiescent  Anemia of CKD Stable  DM2 CBG well-controlled at present -continue to follow with SSI  Hypothyroidism Continue usual Synthroid dose  HLD Continue usual atorvastatin dose  Depression/anxiety Continue usual home medications   Family  Communication: Spoke with the bedside Disposition: Eventual discharge home, likely within 2-3 days   Objective: Blood pressure 130/65, pulse 97, temperature 98.4 F (36.9 C), temperature source Oral, resp. rate 19, height 5\' 5"  (1.651 m), weight 104.8 kg, SpO2 100%.  Intake/Output Summary (Last 24 hours) at 08/06/2023 1001 Last data filed at 08/06/2023 0841 Gross per 24 hour  Intake 0 ml  Output 0 ml  Net 0 ml   Filed Weights   08/05/23 1426  Weight: 104.8 kg    Examination: General: No acute respiratory distress Lungs: Clear to auscultation bilaterally without wheezes or crackles Cardiovascular: Regular rate and rhythm without murmur gallop or rub normal S1 and S2 Abdomen: Mildly tender diffusely and worse on left without rebound or appreciable mass, bowel sounds positive Extremities: No significant cyanosis, clubbing, or edema bilateral lower extremities  CBC: Recent Labs  Lab 07/31/23 0702 08/05/23 1440 08/06/23 0350  WBC  --  16.7* 13.7*  NEUTROABS  --  14.7*  --   HGB 11.9* 9.8* 9.7*  HCT 35.0* 29.8* 29.7*  MCV  --  92.0 92.0  PLT  --  255 221   Basic Metabolic Panel: Recent Labs  Lab 07/31/23 0702 08/05/23 1440 08/05/23 1502 08/06/23 0350  NA 137 129*  --  130*  K 3.1* 2.8*  --  4.0  CL 94* 88*  --  91*  CO2  --  23  --  22  GLUCOSE 183* 188*  --  130*  BUN 19 35*  --  41*  CREATININE 3.50* 4.13*  --  4.63*  CALCIUM  --  8.8*  --  9.0  MG  --   --  2.0  --    GFR: Estimated Creatinine Clearance: 12.8 mL/min (A) (by C-G formula based on SCr of 4.63 mg/dL (H)).   Scheduled Meds:  amiodarone  200 mg Oral Daily   apixaban  5 mg Oral BID   atorvastatin  80 mg Oral Daily   clonazePAM  0.5 mg Oral QHS   insulin aspart  0-5 Units Subcutaneous QHS   insulin aspart  0-9 Units Subcutaneous TID WC   insulin glargine  15 Units Subcutaneous Daily   levothyroxine  50 mcg Oral QAC breakfast   melatonin  3 mg Oral QHS   midodrine  10-20 mg Oral Q T,Th,Sa-HD    pantoprazole  40 mg Oral BID AC   sevelamer carbonate  1,600 mg Oral TID WC   venlafaxine XR  75 mg Oral Q breakfast   Continuous Infusions:  cefTRIAXone (ROCEPHIN)  IV       LOS: 0 days   Lonia Blood, MD [facility] Office  [phone] Pager - Text Page per Amion  If 7PM-7AM, please contact night-coverage per Amion 08/06/2023, 10:01 AM

## 2023-08-06 NOTE — Progress Notes (Signed)
 PHARMACY - PHYSICIAN COMMUNICATION CRITICAL VALUE ALERT - BLOOD CULTURE IDENTIFICATION (BCID)  Susan [name] is an 73 y.o. female who presented to [facility] on 08/05/2023 with a chief complaint of dysuria, abdominal and back pain.   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 Now with one bottle of each set positive for E coli in the blood. No resistance detected.   Name of physician (or Provider) ContactedSharon Seller   Current antibiotics: Ceftriaxone   Changes to prescribed antibiotics recommended:  Increase ceftriaxone to 2 gm daily   Results for orders placed or performed during the hospital encounter of 08/05/23  Blood Culture ID Panel (Reflexed) (Collected: 08/05/2023  3:02 PM)  Result Value Ref Range   Enterococcus faecalis NOT DETECTED NOT DETECTED   Enterococcus Faecium NOT DETECTED NOT DETECTED   Listeria monocytogenes NOT DETECTED NOT DETECTED   Staphylococcus species NOT DETECTED NOT DETECTED   Staphylococcus aureus (BCID) NOT DETECTED NOT DETECTED   Staphylococcus epidermidis NOT DETECTED NOT DETECTED   Staphylococcus lugdunensis NOT DETECTED NOT DETECTED   Streptococcus species NOT DETECTED NOT DETECTED   Streptococcus agalactiae NOT DETECTED NOT DETECTED   Streptococcus pneumoniae NOT DETECTED NOT DETECTED   Streptococcus pyogenes NOT DETECTED NOT DETECTED   A.calcoaceticus-baumannii NOT DETECTED NOT DETECTED   Bacteroides fragilis NOT DETECTED NOT DETECTED   Enterobacterales DETECTED (A) NOT DETECTED   Enterobacter cloacae complex NOT DETECTED NOT DETECTED   Escherichia coli DETECTED (A) NOT DETECTED   Klebsiella aerogenes NOT DETECTED NOT DETECTED   Klebsiella oxytoca NOT DETECTED NOT DETECTED   Klebsiella pneumoniae NOT DETECTED NOT DETECTED   Proteus species NOT DETECTED NOT DETECTED   Salmonella species NOT DETECTED NOT DETECTED   Serratia marcescens NOT DETECTED NOT DETECTED   Haemophilus influenzae NOT DETECTED NOT  DETECTED   Neisseria meningitidis NOT DETECTED NOT DETECTED   Pseudomonas aeruginosa NOT DETECTED NOT DETECTED   Stenotrophomonas maltophilia NOT DETECTED NOT DETECTED   Candida albicans NOT DETECTED NOT DETECTED   Candida auris NOT DETECTED NOT DETECTED   Candida glabrata NOT DETECTED NOT DETECTED   Candida krusei NOT DETECTED NOT DETECTED   Candida parapsilosis NOT DETECTED NOT DETECTED   Candida tropicalis NOT DETECTED NOT DETECTED   Cryptococcus neoformans/gattii NOT DETECTED NOT DETECTED   CTX-M ESBL NOT DETECTED NOT DETECTED   Carbapenem resistance IMP NOT DETECTED NOT DETECTED   Carbapenem resistance KPC NOT DETECTED NOT DETECTED   Carbapenem resistance NDM NOT DETECTED NOT DETECTED   Carbapenem resist OXA 48 LIKE NOT DETECTED NOT DETECTED   Carbapenem resistance VIM NOT DETECTED NOT DETECTED    Sharin Mons, PharmD, BCPS, BCIDP Infectious Diseases Clinical Pharmacist Phone: [phone] 08/06/2023  9:25 AM

## 2023-08-06 NOTE — TOC CM/SW Note (Addendum)
 Transition of Care [facility]) - Inpatient Brief Assessment   Patient Details  Name: Susan [name] MRN: [id number] Date of Birth: [birth year]  Transition of Care [facility]) CM/SW Contact:    Tom-Johnson, Hershal Coria, RN Phone Number: 08/06/2023, 4:01 PM   Clinical Narrative:  Patient presented to the ED with  Dysuria, Abdominal and Back pain. Admitted with Complicated UTI, on IV abx.  Has hx of PAF on Eliquis, CAD s/p PCI, chronic HFrEF (EF 30-35%), Insulin-dependent T2DM, HTN, HLD, Hypothyroidism, Anemia, Rt BKA, Depression/Anxiety, ESRD, on TTS schedule for outpatient HD. Husband transports to and from HD.   From home with husband, has two supportive children. Has all necessary DME's at home. Has home O2 from [employer].  PCP is [name], Priscille Heidelberg, MD and uses [facility] on [address]   No TOC needs or recommendations noted at this time.  Patient not Medically ready for discharge.  CM will continue to follow as patient progresses with care towards discharge.            Transition of Care Asessment: Insurance and Status: Insurance coverage has been reviewed Patient has primary care physician: Yes Home environment has been reviewed: Yes Prior level of function:: Modified Independent Prior/Current Home Services: No current home services Social Drivers of Health Review: SDOH reviewed no interventions necessary Readmission risk has been reviewed: Yes Transition of care needs: no transition of care needs at this time

## 2023-08-06 NOTE — Consult Note (Addendum)
 ESRD Consult Note  Requesting provider: Sharon Seller, MD Service requesting consult: TRH Reason for consult: ESRD, provision of dialysis Indication for acute dialysis?: End Stage Renal Disease  Outpatient dialysis unit: [facility] Outpatient dialysis prescription: TTSa. F180. 2K, 2Ca. Duration: 4h. EDW 77 kg. Access: R subclavian HD catheter. Mircera 50 mcg last given 02/22. Receives calcitriol 2.25 mcg with each session.  Assessment/Recommendations: Susan [name] is a/an 73 y.o. female with a past medical history notable for ESRD on HD admitted with UTI.   # ESRD: On TTSa HD. No recent missed sessions. Initially hypokalemic on presentation but this corrected with supplementation. Renal function stable. Will arrange for HD today, continue on schedule while admitted.  # Hyponatremia: S/p 500 cc NS in ED. Clinically mildly hypervolemic. Optimize volume status with HD.  # Volume/hypertension: She does now know her EDW. Weight at admission was 104.8 kg. EDW 77 kg per rounding note 03/04, has not been able to hit that due to hypotension. Today's weight is 75.8 kg. Attempt to push lower EDW as tolerated given volume status. BP above her "normal." Continue midodrine with HD.  # Anemia of Chronic Kidney Disease: Hemoglobin 9.7 and stable. Currently receiving venofer 100 mg TID with HD.   # Secondary Hyperparathyroidism/Hyperphosphatemia: Calcium WNL. Last dose of mircera given 02/22. Receives calcitriol 2.25 mcg TID with HD.  # Vascular access: R tunneled subclavian catheter. Clean, dry dressing overlaying skin entry. She states this has been there for about 1.5 years. 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 Most recent venography 07/31/2023. Will plan for dialysis today, then she will need line holiday x48h and repeat blood cultures before replacement of line.   # E. coli bacteremia and UTI: On ceftriaxone. HDS. Urine culture pending. Recommend  echocardiogram.  # Paroxysmal atrial fibrillation: Rate controlled. On home amiodarone, eliquis. Per primary.  # HFrEF: Concern for mild volume overload. Volume management with HD as above.  # Remainder per primary: Coronary artery disease/hyperlipidemia: Eliquis, atorvastatin. Type 2 diabetes mellitus: SSI, semglee. Hypothyroidism: Synthroid. Depression/anxiety: Effexor, clonazepam.  # Additional recommendations: - Dose all meds for creatinine clearance < 10 ml/min  - Unless absolutely necessary, no MRIs with gadolinium.  - Implement save arm precautions.  Prefer needle sticks in the dorsum of the hands or wrists.  No blood pressure measurements in arm. - If blood transfusion is requested during hemodialysis sessions, please alert [name] prior to the session.  - Use synthetic opioids (Fentanyl/Dilaudid) if needed  Recommendations were discussed with the primary team.   History of Present Illness: Susan [name] is a/an 73 y.o. female with a past medical history of ESRD  on TTSa HD, PAF on eliquis, CAD s/p PCI, HFrEF (LV EF 30-35%), T2DM, HTN, hypothyroidism, ACKD, who presents with dysuria, abdominal pain, and back pain.  She states her symptoms of dysuria, back and abdominal pain began Tuesday 03/11 and she developed chills while at hemodialysis. As a result she was advised to present to ED for further evaluation. She was initially hypotensive to 76/41, afebrile; labs notable for leukocytosis 16.7, hyponatremia 129, hypokalemia 2.8. Urinalysis with moderate hemoglobin, 21-50 RBC, moderate leukocytes, >50 WBC, many bacteria, WBC clumps, 0-5 non squamous epithelial cells, small bilirubin, >300 protein. Urine culture pending, blood culture positive for e. Coli bacteremia.  She was temporized with IVF, potassium supplementation, and started on antibiotics for UTI. Nephrology consulted for management of ESRD/HD needs while hospitalized.  Susan [name] states that prior to onset  of her acute illness she  has been feeling generally well. She normally wears 2-2.5L O2 at night due to inability to tolerate laying flat; she states that this has been worse over the last several days. She also endorses back pain and abdominal pain, both of which are new concerns. She has had her current tunneled HD catheter for about 1.5 years now and has been undergoing evaluations for AVF creation.  Medications:  Current Facility-Administered Medications  Medication Dose Route Frequency Provider Last Rate Last Admin   acetaminophen (TYLENOL) tablet 650 mg  650 mg Oral Q6H PRN Charlsie Quest, MD       albuterol (VENTOLIN HFA) 108 (90 Base) MCG/ACT inhaler 1-2 puff  1-2 puff Inhalation Q6H PRN Lonia Blood, MD       amiodarone (PACERONE) tablet 200 mg  200 mg Oral Daily Darreld Mclean R, MD   200 mg at 08/06/23 0918   apixaban (ELIQUIS) tablet 5 mg  5 mg Oral BID Charlsie Quest, MD   5 mg at 08/06/23 7322   atorvastatin (LIPITOR) tablet 80 mg  80 mg Oral Daily Charlsie Quest, MD   80 mg at 08/06/23 0254   bisacodyl (DULCOLAX) EC tablet 5 mg  5 mg Oral Daily PRN Charlsie Quest, MD       cefTRIAXone (ROCEPHIN) 2 g in sodium chloride 0.9 % 100 mL IVPB  2 g Intravenous Q24H Sinclair, Lateef Juncaj S, RPH       clonazePAM (KLONOPIN) disintegrating tablet 0.5 mg  0.5 mg Oral QHS Charlsie Quest, MD       folic acid-vitamin b complex-vitamin c-selenium-zinc (DIALYVITE) tablet 1 tablet  1 tablet Oral Daily Lonia Blood, MD       insulin aspart (novoLOG) injection 0-5 Units  0-5 Units Subcutaneous QHS Patel, Vishal R, MD       insulin aspart (novoLOG) injection 0-9 Units  0-9 Units Subcutaneous TID WC Charlsie Quest, MD   1 Units at 08/06/23 0919   insulin glargine (LANTUS) injection 15 Units  15 Units Subcutaneous Daily Charlsie Quest, MD       levothyroxine (SYNTHROID) tablet 50 mcg  50 mcg Oral QAC breakfast Darreld Mclean R, MD   50 mcg at 08/06/23 2706   melatonin tablet 3 mg  3 mg Oral QHS Patel, Vishal R, MD        midodrine (PROAMATINE) tablet 10-20 mg  10-20 mg Oral Q T,Th,Sa-HD Charlsie Quest, MD       pantoprazole (PROTONIX) EC tablet 40 mg  40 mg Oral BID AC Darreld Mclean R, MD   40 mg at 08/06/23 2376   prochlorperazine (COMPAZINE) injection 10 mg  10 mg Intravenous Q6H PRN Charlsie Quest, MD       senna-docusate (Senokot-S) tablet 1 tablet  1 tablet Oral QHS PRN Charlsie Quest, MD       sevelamer carbonate (RENVELA) tablet 1,600 mg  1,600 mg Oral TID WC Darreld Mclean R, MD   1,600 mg at 08/06/23 2831   venlafaxine XR (EFFEXOR-XR) 24 hr capsule 75 mg  75 mg Oral Q breakfast Charlsie Quest, MD   75 mg at 08/06/23 5176     ALLERGIES Cephalexin and Codeine  MEDICAL HISTORY Past Medical History:  Diagnosis Date   Anemia    hx   Anxiety    Arthritis    "generalized" (03/15/2014)   CAD (coronary artery disease)    MI in 2000 - MI  2007 - treated bare metal stent (no nuclear since then as 9/11)   Carotid artery disease (HCC)    Chronic diastolic heart failure (HCC)    a) ECHO (08/2013) EF 55-60% and RV function nl b) RHC (08/2013) RA 4, RV 30/5/7, PA 25/10 (16), PCWP 7, Fick CO/CI 6.3/2.7, PVR 1.5 WU, PA 61 and 66%   Daily headache    "~ every other day; since I fell in June" (03/15/2014)   Depression    Diabetic retinopathy (HCC)    Dyslipidemia    ESRD (end stage renal disease) (HCC)    Dialysis on Tues Thurs Sat   Exertional shortness of breath    GERD (gastroesophageal reflux disease)    History of blood transfusion    History of kidney stones    HTN (hypertension)    Hypothyroidism    Myocardial infarction (HCC)    Obesity    Osteoarthritis    PAF (paroxysmal atrial fibrillation) (HCC)    Peripheral neuropathy    bilateral feet/hands   PONV (postoperative nausea and vomiting)    RBBB (right bundle branch block)    Old   Stroke (HCC)    mini strokes   Type II diabetes mellitus (HCC)    Type II, [name]. patient has omnipod insulin pump with Novolin R  Insulin    SOCIAL HISTORY Social History   Socioeconomic History   Marital status: Married    Spouse name: Ramon Dredge   Number of children: 3   Years of education: 12th   Highest education level: Not on file  Occupational History    Employer: UNEMPLOYED  Tobacco Use   Smoking status: Former    Current packs/day: 0.00    Average packs/day: 3.0 packs/day for 32.0 years (96.0 ttl pk-yrs)    Types: Cigarettes    Start date: 10/24/1965    Quit date: 10/24/1997    Years since quitting: 25.8   Smokeless tobacco: Never  Vaping Use   Vaping status: Never Used  Substance and Sexual Activity   Alcohol use: Not Currently   Drug use: No   Sexual activity: Not Currently    Birth control/protection: Surgical    Comment: Hysterectomy  Other Topics Concern   Not on file  Social History Narrative   Pt lives at home with her spouse.Caffeine Use- 3 sodas daily.    Social Drivers of Corporate investment banker Strain: Low Risk  (08/16/2021)   Overall Financial Resource Strain (CARDIA)    Difficulty of Paying Living Expenses: Not hard at all  Food Insecurity: No Food Insecurity (08/06/2023)   Hunger Vital Sign    Worried About Running Out of Food in the Last Year: Never true    Ran Out of Food in the Last Year: Never true  Transportation Needs: No Transportation Needs (08/06/2023)   PRAPARE - Administrator, Civil Service (Medical): No    Lack of Transportation (Non-Medical): No  Physical Activity: Inactive (08/16/2021)   Exercise Vital Sign    Days of Exercise per Week: 0 days    Minutes of Exercise per Session: 0 min  Stress: No Stress Concern Present (08/16/2021)   [employer] of Occupational Health - Occupational Stress Questionnaire    Feeling of Stress : Not at all  Social Connections: Moderately Isolated (08/06/2023)   Social Connection and Isolation Panel [NHANES]    Frequency of Communication with Friends and Family: More than three times a week    Frequency of  Social  Gatherings with Friends and Family: More than three times a week    Attends Religious Services: Never    Database administrator or Organizations: No    Attends Banker Meetings: Never    Marital Status: Married  Catering manager Violence: Not At Risk (08/06/2023)   Humiliation, Afraid, Rape, and Kick questionnaire    Fear of Current or Ex-Partner: No    Emotionally Abused: No    Physically Abused: No    Sexually Abused: No    FAMILY HISTORY Family History  Problem Relation Age of Onset   Heart attack Mother 77    Review of Systems: 12 systems were reviewed and negative except per HPI  Physical Exam: Vitals:   08/06/23 0613 08/06/23 0734  BP: 118/68 130/65  Pulse: 96 97  Resp:  19  Temp: 97.6 F (36.4 C) 98.4 F (36.9 C)  SpO2: 100% 100%   No intake/output data recorded.  Intake/Output Summary (Last 24 hours) at 08/06/2023 1022 Last data filed at 08/06/2023 0841 Gross per 24 hour  Intake 0 ml  Output 0 ml  Net 0 ml   Constitutional:Appears stated age, resting comfortably in NAD. Cardio:Regular rate with irregularly irregular rhythm. Possible murmur noted. Pulm:R basilar crackles. Normal work of breathing on 3L [place]. Chest:R subclavian HD catheter with clean, dry dressing overlaying. Abdomen:Soft, tender to palpation. ZOX:WRUEA pitting edema to LLE. RLE s/p BKA. Skin:Warm and dry. Neuro:Alert and oriented x3. No focal deficit noted. Psych:Pleasant mood and affect.  Test Results Reviewed Lab Results  Component Value Date   NA 130 (L) 08/06/2023   K 4.0 08/06/2023   CL 91 (L) 08/06/2023   CO2 22 08/06/2023   BUN 41 (H) 08/06/2023   CREATININE 4.63 (H) 08/06/2023   GFR 41.58 (L) 08/17/2013   CALCIUM 9.0 08/06/2023   ALBUMIN 2.4 (L) 08/05/2023   PHOS 2.7 02/17/2023    I have reviewed relevant outside healthcare records    Champ Mungo, DO Internal Medicine PGY-3

## 2023-08-06 NOTE — Progress Notes (Signed)
 PT Cancellation Note  Patient Details Name: Susan [name] MRN: [id number] DOB: January 31, 1951   Cancelled Treatment:    Reason Eval/Treat Not Completed: Patient declined, no reason specified  Pt familiar to me from prior admissions. In severe back pain and complains of general malaise. Declines PT evaluation at this time. States she is getting dialysis later and would like to hold formal PT evaluation until tomorrow.  Encouraged bed level mobility and reviewed familiar LE exercises as tolerated.  Plan to follow-up tomorrow for evaluation.  Kathlyn Sacramento, PT, DPT [facility]  Rehabilitation Services Physical Therapist Office: [phone] Website: [email or web address]   Berton Mount 08/06/2023, 4:09 PM

## 2023-08-07 ENCOUNTER — Other Ambulatory Visit (HOSPITAL_COMMUNITY): Payer: Self-pay | Admitting: Internal Medicine

## 2023-08-07 DIAGNOSIS — N390 Urinary tract infection, site not specified: Secondary | ICD-10-CM | POA: Diagnosis not present

## 2023-08-07 LAB — FERRITIN: Ferritin: 2289 ng/mL — ABNORMAL HIGH (ref 11–307)

## 2023-08-07 LAB — RENAL FUNCTION PANEL
Albumin: 2.2 g/dL — ABNORMAL LOW (ref 3.5–5.0)
Anion gap: 15 (ref 5–15)
BUN: 22 mg/dL (ref 8–23)
CO2: 24 mmol/L (ref 22–32)
Calcium: 8.6 mg/dL — ABNORMAL LOW (ref 8.9–10.3)
Chloride: 93 mmol/L — ABNORMAL LOW (ref 98–111)
Creatinine, Ser: 3.25 mg/dL — ABNORMAL HIGH (ref 0.44–1.00)
GFR, Estimated: 14 mL/min — ABNORMAL LOW (ref >60)
Glucose, Bld: 103 mg/dL — ABNORMAL HIGH (ref 70–99)
Phosphorus: 3.5 mg/dL (ref 2.5–4.6)
Potassium: 3.3 mmol/L — ABNORMAL LOW (ref 3.5–5.1)
Sodium: 132 mmol/L — ABNORMAL LOW (ref 135–145)

## 2023-08-07 LAB — GLUCOSE, CAPILLARY
Glucose-Capillary: 102 mg/dL — ABNORMAL HIGH (ref 70–99)
Glucose-Capillary: 148 mg/dL — ABNORMAL HIGH (ref 70–99)
Glucose-Capillary: 198 mg/dL — ABNORMAL HIGH (ref 70–99)
Glucose-Capillary: 303 mg/dL — ABNORMAL HIGH (ref 70–99)

## 2023-08-07 LAB — URINE CULTURE: Culture: >=100000 — AB

## 2023-08-07 LAB — CBC
HCT: 30 % — ABNORMAL LOW (ref 36.0–46.0)
Hemoglobin: 9.9 g/dL — ABNORMAL LOW (ref 12.0–15.0)
MCH: 29.8 pg (ref 26.0–34.0)
MCHC: 33 g/dL (ref 30.0–36.0)
MCV: 90.4 fL (ref 80.0–100.0)
Platelets: 258 10*3/uL (ref 150–400)
RBC: 3.32 MIL/uL — ABNORMAL LOW (ref 3.87–5.11)
RDW: 15 % (ref 11.5–15.5)
WBC: 11.3 10*3/uL — ABNORMAL HIGH (ref 4.0–10.5)
nRBC: 0 % (ref 0.0–0.2)

## 2023-08-07 LAB — HEPATITIS B SURFACE ANTIBODY, QUANTITATIVE: Hep B S AB Quant (Post): 230 m[IU]/mL

## 2023-08-07 LAB — IRON AND TIBC
Iron: 24 ug/dL — ABNORMAL LOW (ref 28–170)
Saturation Ratios: 14 % (ref 10.4–31.8)
TIBC: 175 ug/dL — ABNORMAL LOW (ref 250–450)
UIBC: 151 ug/dL

## 2023-08-07 MED ORDER — HEPARIN SODIUM (PORCINE) 1000 UNIT/ML IJ SOLN
INTRAMUSCULAR | Status: AC
  Filled 2023-08-07: qty 4

## 2023-08-07 MED ORDER — SODIUM CHLORIDE 0.9 % IV SOLN
1.0000 g | INTRAVENOUS | Status: AC
  Administered 2023-08-07 – 2023-08-13 (×7): 1 g via INTRAVENOUS
  Filled 2023-08-07 (×7): qty 20

## 2023-08-07 NOTE — Progress Notes (Addendum)
 [facility] Progress Note   Subjective:   Pt had HD overnight. Denies SOB, CP. Reports mild dizziness and nausea. Reports ongoing back pain. Noted Bcx positive.  Objective Vitals:   08/07/23 0402 08/07/23 0432 08/07/23 0455 08/07/23 0607  BP: 121/63 126/64 113/60 123/63  Pulse: 78 85 80 85  Resp: 13 15 17 18   Temp:   98.2 F (36.8 C) 98.6 F (37 C)  TempSrc:   Oral   SpO2: 100% 100% 100% 99%  Weight:      Height:       Physical Exam General: Alert female in NAD Heart: RRR, no murmurs, rubs or gallops Lungs: CTA bilaterally, respirations unlabored. On O2 via [place] Abdomen: Soft, non-distended, +BS Extremities: No edema b/l lower extremities Dialysis Access: [facility]  Additional Objective Labs: Basic Metabolic Panel: Recent Labs  Lab 08/05/23 1440 08/06/23 0350 08/07/23 0628  NA 129* 130* 132*  K 2.8* 4.0 3.3*  CL 88* 91* 93*  CO2 23 22 24   GLUCOSE 188* 130* 103*  BUN 35* 41* 22  CREATININE 4.13* 4.63* 3.25*  CALCIUM 8.8* 9.0 8.6*  PHOS  --   --  3.5   Liver Function Tests: Recent Labs  Lab 08/05/23 1440 08/07/23 0628  AST 48*  --   ALT 25  --   ALKPHOS 138*  --   BILITOT 0.9  --   PROT 6.2*  --   ALBUMIN 2.4* 2.2*   Recent Labs  Lab 08/05/23 1440  LIPASE 17   CBC: Recent Labs  Lab 08/05/23 1440 08/06/23 0350 08/07/23 0628  WBC 16.7* 13.7* 11.3*  NEUTROABS 14.7*  --   --   HGB 9.8* 9.7* 9.9*  HCT 29.8* 29.7* 30.0*  MCV 92.0 92.0 90.4  PLT 255 221 258   Blood Culture    Component Value Date/Time   SDES URINE, RANDOM 08/05/2023 1816   SPECREQUEST  08/05/2023 1816    NONE Reflexed from [id number] Performed at [facility], [address] [place], [place] [address]    CULT (A) 08/05/2023 1816    >=100,000 COLONIES/mL ESCHERICHIA COLI Confirmed Extended Spectrum Beta-Lactamase Producer (ESBL).  In bloodstream infections from ESBL organisms, carbapenems are preferred over piperacillin/tazobactam. They are shown to have a lower  risk of mortality.    REPTSTATUS 08/07/2023 FINAL 08/05/2023 1816    Cardiac Enzymes: No results for input(s): "CKTOTAL", "CKMB", "CKMBINDEX", "TROPONINI" in the last 168 hours. CBG: Recent Labs  Lab 08/06/23 0726 08/06/23 1145 08/06/23 1645 08/06/23 2059 08/07/23 0625  GLUCAP 121* 121* 119* 146* 102*   Iron Studies:  Recent Labs    08/07/23 0628  IRON 24*  TIBC 175*  FERRITIN 2,289*   @lablastinr3 @ Studies/Results: CT Angio Chest/Abd/Pel for Dissection W and/or Wo Contrast Result Date: 08/05/2023 CLINICAL DATA:  Acute aortic syndrome suspected. End-stage renal disease. EXAM: CT ANGIOGRAPHY CHEST, ABDOMEN AND PELVIS TECHNIQUE: Multidetector CT imaging through the chest, abdomen and pelvis was performed using the standard protocol during bolus administration of intravenous contrast. Multiplanar reconstructed images and MIPs were obtained and reviewed to evaluate the vascular anatomy. RADIATION DOSE REDUCTION: This exam was performed according to the departmental dose-optimization program which includes automated exposure control, adjustment of the mA and/or kV according to patient size and/or use of iterative reconstruction technique. CONTRAST:  70mL OMNIPAQUE IOHEXOL 350 MG/ML SOLN COMPARISON:  CT abdomen and pelvis 02/07/2023. CT angiogram chest abdomen and pelvis 08/27/2022. FINDINGS: CTA CHEST FINDINGS Cardiovascular: There is no evidence for aortic aneurysm or dissection. No  central pulmonary embolism. The heart is enlarged. There is no pericardial effusion. There are atherosclerotic calcifications of the aorta and coronary arteries. Right-sided central venous catheter tip ends in the distal SVC. Mediastinum/Nodes: There is an enlarged precarinal lymph node measuring 14 mm, unchanged. There are calcified subcarinal and right hilar lymph nodes. Visualized thyroid gland and esophagus are within normal limits. Lungs/Pleura: There is dependent atelectasis in the lower lobes and left upper  lobe. There are trace bilateral pleural effusions. There is a calcified granuloma in the right upper lobe. Musculoskeletal: No acute fractures are seen. Review of the MIP images confirms the above findings. CTA ABDOMEN AND PELVIS FINDINGS VASCULAR Aorta: No evidence for aortic aneurysm or dissection. Calcified atherosclerotic disease is present. Celiac: Patent without evidence of aneurysm, dissection, vasculitis or significant stenosis. SMA: Patent without evidence of aneurysm, dissection, vasculitis or significant stenosis. Renals: Patent without evidence of aneurysm, dissection, vasculitis or significant stenosis. IMA: Patent without evidence of aneurysm, dissection, vasculitis or significant stenosis. Inflow: Patent without evidence of aneurysm, dissection, vasculitis or significant stenosis. Veins: No obvious venous abnormality within the limitations of this arterial phase study. Review of the MIP images confirms the above findings. NON-VASCULAR Hepatobiliary: Gallstones are present. No biliary ductal dilatation. There are calcified granulomas throughout the liver Pancreas: Unremarkable. No pancreatic ductal dilatation or surrounding inflammatory changes. Spleen: Calcified granulomas are present. Normal in size. Adrenals/Urinary Tract: Left adrenal nodule measures 14 mm. Right adrenal gland is within normal limits. There is mild left-sided hydroureteronephrosis. There is a small amount of air in the proximal left ureter. No obstructing calculus identified. Right peripelvic cyst measures 2.3 x 3.1 cm. The bladder is decompressed. There is a small amount of air in the bladder. Stomach/Bowel: Stomach is within normal limits. Appendix appears normal. There is rectal wall thickening. No focal inflammatory stranding. Appendix is within normal limits. Scattered colonic diverticula are present. The stomach is within normal limits. Lymphatic: No enlarged lymph nodes are seen. Reproductive: Status post hysterectomy. No  adnexal masses. Other: No abdominal wall hernia or abnormality. No abdominopelvic ascites. Musculoskeletal: Degenerative changes affect the spine. Review of the MIP images confirms the above findings. IMPRESSION: 1. No evidence for aortic aneurysm or dissection. 2. Cardiomegaly. 3. Trace bilateral pleural effusions with dependent atelectasis. 4. Stable mediastinal lymphadenopathy. 5. Cholelithiasis. 6. Mild left-sided hydroureteronephrosis. No obstructing calculus identified. 7. Small amount of air in the bladder and proximal left ureter. Findings may be related to recent instrumentation, infection or emphysematous pyelitis. 8. Rectal wall thickening worrisome for proctitis. 9. Colonic diverticulosis. Aortic Atherosclerosis (ICD10-I70.0) and Emphysema (ICD10-J43.9). Electronically Signed   By: Darliss Cheney M.D.   On: 08/05/2023 20:35   Medications:  meropenem (MERREM) IV      amiodarone  200 mg Oral Daily   apixaban  5 mg Oral BID   atorvastatin  80 mg Oral Daily   Chlorhexidine Gluconate Cloth  6 each Topical Q0600   clonazePAM  0.5 mg Oral QHS   heparin sodium (porcine)       insulin aspart  0-5 Units Subcutaneous QHS   insulin aspart  0-9 Units Subcutaneous TID WC   insulin glargine  15 Units Subcutaneous Daily   levothyroxine  50 mcg Oral QAC breakfast   melatonin  3 mg Oral QHS   midodrine  10-20 mg Oral Q T,Th,Sa-HD   multivitamin  1 tablet Oral QHS   pantoprazole  40 mg Oral BID AC   sevelamer carbonate  1,600 mg Oral TID WC   venlafaxine  XR  75 mg Oral Q breakfast    Dialysis Orders: [facility]. F180. 2K, 2Ca. Duration: 4h. EDW 77 kg. Access: R subclavian HD catheter. Mircera 50 mcg last given 02/22. Receives calcitriol 2.25 mcg with each session.   Assessment/Plan:  # ESRD: On TTSa HD. No recent missed sessions. Initially hypokalemic on presentation but this corrected with supplementation. Renal function stable.   # Hyponatremia: S/p 500 cc NS in ED. Was mildly volume  overloaded, now improved. Na 132.    # Volume/hypertension: Variable weights. Appears volume status is improving with HD. Continue to challenge EDW as tolerated.    # Anemia of Chronic Kidney Disease: Hemoglobin 9.9 and stable. Currently receiving venofer 100 mg TID with HD.    # Secondary Hyperparathyroidism/Hyperphosphatemia: Calcium and phos at goal. Receives calcitriol 2.25 mcg TID with HD. Continue renvela.    # Vascular access: R tunneled subclavian catheter. Clean, dry dressing overlaying skin entry. She states this has been there for about 1.5 years. 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 Most recent venography 07/31/2023. Consider catheter exchange after 48 hours of abx. Not planning for line holiday at this time, have notified IR of change of plans.    # E. coli bacteremia and UTI: On ceftriaxone. HDS. Urine culture and blood cultures positive for e.coli, see above.  # Paroxysmal atrial fibrillation: Rate controlled. On home amiodarone, eliquis. Per primary.   Rogers Blocker, PA-C 08/07/2023, 9:02 AM  [facility] Pager: [phone]

## 2023-08-07 NOTE — Evaluation (Addendum)
 Occupational Therapy Evaluation Patient Details Name: Susan [name] MRN: [id number] DOB: [birth year] Today's Date: 08/07/2023   History of Present Illness   Pt is a 73 y/o female presenting on 3/13 with abdominal pain, dysuria, and back pain. UA consistent with UTI, e coli UTI. PMH includes: ESRD on TTS HD, PAF on eliquis, CAD s/p PCI, chronic HFrER, DM2, HTN, anemia of chronic disease, R BKA.     Clinical Impressions PTA patient reports needing assist from spouse for transfers using RW to wheelchair, assist for LB ADLs from bed level but able to manage UB ADLS/grooming after setup.  Patient has good support from spouse at home, who pt reports was helping her "a lot" with transfers and toileting needs.  Today, she requires mod assist for bed mobility, min guard to close supervision to sit EOB but declines further mobility at this time.  She requires setup to total assist for ADLs. Anticipate she is close to baseline for ADLs but would need increased assist for transfers at this time.  Will follow acutely to optimize return to baseline, decrease burden of care with follow up recommendations for HHOT at this time.      If plan is discharge home, recommend the following:   Two people to help with walking and/or transfers;A lot of help with bathing/dressing/bathroom;Assistance with cooking/housework;Assist for transportation;Help with stairs or ramp for entrance     Functional Status Assessment   Patient has had a recent decline in their functional status and demonstrates the ability to make significant improvements in function in a reasonable and predictable amount of time.     Equipment Recommendations   None recommended by OT     Recommendations for Other Services         Precautions/Restrictions   Precautions Precautions: Fall Recall of Precautions/Restrictions: Intact Required Braces or Orthoses:  (R prosthetic) Restrictions Weight Bearing Restrictions Per Provider  Order: No     Mobility Bed Mobility Overal bed mobility: Needs Assistance Bed Mobility: Supine to Sit, Sit to Supine     Supine to sit: Mod assist, HOB elevated, Used rails Sit to supine: Mod assist, Used rails        Transfers                   General transfer comment: deferred      Balance Overall balance assessment: Needs assistance Sitting-balance support: No upper extremity supported, Feet unsupported Sitting balance-Leahy Scale: Fair Sitting balance - Comments: statically min guard, limited dynamically                                   ADL either performed or assessed with clinical judgement   ADL Overall ADL's : Needs assistance/impaired     Grooming: Set up;Sitting           Upper Body Dressing : Minimal assistance;Sitting   Lower Body Dressing: Total assistance;Bed level     Toilet Transfer Details (indicate cue type and reason): deferred         Functional mobility during ADLs: Moderate assistance General ADL Comments: to EOB only     Vision Patient Visual Report: No change from baseline Vision Assessment?: No apparent visual deficits     Perception         Praxis         Pertinent Vitals/Pain Pain Assessment Pain Assessment: Faces Faces Pain Scale: Hurts little more Pain Location: L groin  Pain Descriptors / Indicators: Discomfort Pain Intervention(s): Limited activity within patient's tolerance, Monitored during session, Repositioned     Extremity/Trunk Assessment Upper Extremity Assessment Upper Extremity Assessment: Generalized weakness;RUE deficits/detail RUE Deficits / Details: reports dislocated R shoulder, very limited shoulder flexion and decreased functional use RUE Coordination: decreased gross motor   Lower Extremity Assessment Lower Extremity Assessment: Defer to PT evaluation       Communication Communication Communication: No apparent difficulties   Cognition Arousal: Alert Behavior  During Therapy: WFL for tasks assessed/performed Cognition: No apparent impairments             OT - Cognition Comments: appears WFL, not formally assessed                 Following commands: Intact       Cueing  General Comments   Cueing Techniques: Verbal cues      Exercises     Shoulder Instructions      Home Living Family/patient expects to be discharged to:: Private residence Living Arrangements: Spouse/significant other Available Help at Discharge: Family;Available 24 hours/day Type of Home: Mobile home Home Access: Ramped entrance     Home Layout: One level     Bathroom Shower/Tub: Tub/shower unit;Door   Bathroom Toilet: Handicapped height     Home Equipment: Grab bars - tub/shower;Rolling Environmental consultant (2 wheels);Rollator (4 wheels);BSC/3in1;Cane - quad;Shower seat;Wheelchair - manual;Hospital bed          Prior Functioning/Environment Prior Level of Function : Needs assist             Mobility Comments: reports has been transferring with spouse assist at home, stand step from EOB to wheelchair using RW ADLs Comments: Requires assist for ADLs bed level, pt reports self feeding, grooming and managing UB ADLs. Using brief to have BM and husband A with cleaning, rolling in bed to assist with spouse donning LB clothing.    OT Problem List: Decreased strength;Decreased activity tolerance;Decreased range of motion;Impaired balance (sitting and/or standing);Pain;Decreased knowledge of precautions;Decreased knowledge of use of DME or AE;Decreased safety awareness   OT Treatment/Interventions: Self-care/ADL training;Therapeutic exercise;DME and/or AE instruction;Therapeutic activities;Balance training;Patient/family education      OT Goals(Current goals can be found in the care plan section)   Acute Rehab OT Goals Patient Stated Goal: get better OT Goal Formulation: With patient Time For Goal Achievement: 08/21/23 Potential to Achieve Goals: Fair    OT Frequency:  Min 2X/week    Co-evaluation              AM-PAC OT "6 Clicks" Daily Activity     Outcome Measure Help from another person eating meals?: None Help from another person taking care of personal grooming?: A Little Help from another person toileting, which includes using toliet, bedpan, or urinal?: Total Help from another person bathing (including washing, rinsing, drying)?: A Lot Help from another person to put on and taking off regular upper body clothing?: A Little Help from another person to put on and taking off regular lower body clothing?: Total 6 Click Score: 14   End of Session Equipment Utilized During Treatment: Oxygen (1L) Nurse Communication: Mobility status  Activity Tolerance: Patient tolerated treatment well Patient left: in bed;with bed alarm set;with call bell/phone within reach  OT Visit Diagnosis: Other abnormalities of gait and mobility (R26.89);Muscle weakness (generalized) (M62.81);Pain Pain - Right/Left: Left Pain - part of body:  (groin)                Time: 1610-9604 OT Time  Calculation (min): 28 min Charges:  OT General Charges $OT Visit: 1 Visit OT Evaluation $OT Eval Moderate Complexity: 1 Mod OT Treatments $Self Care/Home Management : 8-22 mins  Barry Brunner, OT Acute Rehabilitation Services Office [phone] Secure Chat Preferred    Chancy Milroy 08/07/2023, 2:00 PM

## 2023-08-07 NOTE — Progress Notes (Addendum)
 Susan [name]  ZOX:[id number] DOB: May 07, 1951 DOA: 08/05/2023 PCP: Donita Brooks, MD    Brief Narrative:  73 year old with a history of ESRD on HD TTS, PAF on Eliquis, CAD status post PCI, chronic systolic CHF with EF 30-35%, DM2, HTN, HLD, hypothyroidism, anemia of CKD, right BKA, and depression/anxiety who presented to the ER with 24 hours of dysuria associated with abdominal and back pain as well as intermittent shaking chills.  In the ER her blood pressure was 76/41 with a temperature of 98.1 and a WBC of 16.7.  UA was suggestive of UTI.  Goals of Care:   Code Status: Full Code   DVT prophylaxis: apixaban (ELIQUIS) tablet 5 mg   Interim Hx: No acute events reported overnight.  Afebrile.  Vital signs stable.  Received hemodialysis last night.  Feels quite tired today related to late night dialysis.  No chest pain or shortness of breath.  Vague nausea and abdominal discomfort persists.  Assessment & Plan:  Complicated ESBL E. coli UTI - E. 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 - continue directed antibiotic therapy  Hypotension Resolved with simple volume resuscitation  Hypokalemia Corrected with gentle replacement -monitor with ongoing HD  Hyponatremia Hypovolemic in nature -should stabilize with ongoing HD  ESRD on HD TTS Nephrology attending to dialysis needs  PAF on chronic Eliquis Heart rate controlled - continue DOAC  Chronic systolic congestive heart failure No acute exacerbation with patient actually dehydrated at presentation -volume control per dialysis  CAD Quiescent  Anemia of CKD Stable  DM2 CBG well-controlled at present -continue to follow with SSI  Hypothyroidism Continue usual Synthroid dose  HLD Continue usual atorvastatin dose  Depression/anxiety Continue usual home medications  Obesity Class II - Body mass index is 38.44  kg/m.    Family Communication: No family present at time of exam today Disposition: Eventual discharge home    Objective: Blood pressure 123/63, pulse 85, temperature 98.6 F (37 C), resp. rate 18, height 5\' 5"  (1.651 m), weight 104.8 kg, SpO2 99%.  Intake/Output Summary (Last 24 hours) at 08/07/2023 0921 Last data filed at 08/07/2023 0830 Gross per 24 hour  Intake 340 ml  Output 2500 ml  Net -2160 ml   Filed Weights   08/05/23 1426  Weight: 104.8 kg    Examination: General: No acute respiratory distress Lungs: Clear to auscultation bilaterally -no wheezing Cardiovascular: Regular rate and rhythm without murmur gallop or rub  Abdomen: Mildly tender diffusely and worse on left without rebound or appreciable mass, bowel sounds positive Extremities: No significant cyanosis, clubbing, or edema bilateral lower extremities  CBC: Recent Labs  Lab 08/05/23 1440 08/06/23 0350 08/07/23 0628  WBC 16.7* 13.7* 11.3*  NEUTROABS 14.7*  --   --   HGB 9.8* 9.7* 9.9*  HCT 29.8* 29.7* 30.0*  MCV 92.0 92.0 90.4  PLT 255 221 258   Basic Metabolic Panel: Recent Labs  Lab 08/05/23 1440 08/05/23 1502 08/06/23 0350 08/07/23 0628  NA 129*  --  130* 132*  K 2.8*  --  4.0 3.3*  CL 88*  --  91* 93*  CO2 23  --  22 24  GLUCOSE 188*  --  130* 103*  BUN 35*  --  41* 22  CREATININE 4.13*  --  4.63* 3.25*  CALCIUM 8.8*  --  9.0 8.6*  MG  --  2.0  --   --  PHOS  --   --   --  3.5   GFR: Estimated Creatinine Clearance: 18.2 mL/min (A) (by C-G formula based on SCr of 3.25 mg/dL (H)).   Scheduled Meds:  amiodarone  200 mg Oral Daily   apixaban  5 mg Oral BID   atorvastatin  80 mg Oral Daily   Chlorhexidine Gluconate Cloth  6 each Topical Q0600   clonazePAM  0.5 mg Oral QHS   heparin sodium (porcine)       insulin aspart  0-5 Units Subcutaneous QHS   insulin aspart  0-9 Units Subcutaneous TID WC   insulin glargine  15 Units Subcutaneous Daily   levothyroxine  50 mcg Oral QAC  breakfast   melatonin  3 mg Oral QHS   midodrine  10-20 mg Oral Q T,Th,Sa-HD   multivitamin  1 tablet Oral QHS   pantoprazole  40 mg Oral BID AC   sevelamer carbonate  1,600 mg Oral TID WC   venlafaxine XR  75 mg Oral Q breakfast   Continuous Infusions:  meropenem (MERREM) IV       LOS: 1 day   Lonia Blood, MD [facility] Office  [phone] Pager - Text Page per Amion  If 7PM-7AM, please contact night-coverage per Amion 08/07/2023, 9:21 AM

## 2023-08-07 NOTE — Progress Notes (Signed)
 PT Cancellation Note  Patient Details Name: Susan [name] MRN: [id number] DOB: 05/25/1951   Cancelled Treatment:    Reason Eval/Treat Not Completed: Pain limiting ability to participate; reports attempted to mobilize with OT earlier and had a lot of pain, could not move from side of the bed.  States fatigued from having dialysis from 1AM to 6AM. Will continue attempts.    Elray Mcgregor 08/07/2023, 2:59 PM Sheran Lawless, PT Acute Rehabilitation Services Office:[phone] 08/07/2023

## 2023-08-08 DIAGNOSIS — N390 Urinary tract infection, site not specified: Secondary | ICD-10-CM | POA: Diagnosis not present

## 2023-08-08 LAB — GLUCOSE, CAPILLARY
Glucose-Capillary: 385 mg/dL — ABNORMAL HIGH (ref 70–99)
Glucose-Capillary: 342 mg/dL — ABNORMAL HIGH (ref 70–99)
Glucose-Capillary: 371 mg/dL — ABNORMAL HIGH (ref 70–99)

## 2023-08-08 LAB — CULTURE, BLOOD (ROUTINE X 2): Special Requests: ADEQUATE

## 2023-08-08 LAB — RENAL FUNCTION PANEL
Albumin: 2 g/dL — ABNORMAL LOW (ref 3.5–5.0)
Anion gap: 15 (ref 5–15)
BUN: 43 mg/dL — ABNORMAL HIGH (ref 8–23)
CO2: 23 mmol/L (ref 22–32)
Calcium: 8.3 mg/dL — ABNORMAL LOW (ref 8.9–10.3)
Chloride: 89 mmol/L — ABNORMAL LOW (ref 98–111)
Creatinine, Ser: 4.85 mg/dL — ABNORMAL HIGH (ref 0.44–1.00)
GFR, Estimated: 9 mL/min — ABNORMAL LOW (ref >60)
Glucose, Bld: 352 mg/dL — ABNORMAL HIGH (ref 70–99)
Phosphorus: 4.3 mg/dL (ref 2.5–4.6)
Potassium: 3.7 mmol/L (ref 3.5–5.1)
Sodium: 127 mmol/L — ABNORMAL LOW (ref 135–145)

## 2023-08-08 LAB — CBC
HCT: 27.7 % — ABNORMAL LOW (ref 36.0–46.0)
Hemoglobin: 8.8 g/dL — ABNORMAL LOW (ref 12.0–15.0)
MCH: 29.3 pg (ref 26.0–34.0)
MCHC: 31.8 g/dL (ref 30.0–36.0)
MCV: 92.3 fL (ref 80.0–100.0)
Platelets: 240 10*3/uL (ref 150–400)
RBC: 3 MIL/uL — ABNORMAL LOW (ref 3.87–5.11)
RDW: 14.8 % (ref 11.5–15.5)
WBC: 7.9 10*3/uL (ref 4.0–10.5)
nRBC: 0 % (ref 0.0–0.2)

## 2023-08-08 LAB — MAGNESIUM: Magnesium: 2.1 mg/dL (ref 1.7–2.4)

## 2023-08-08 MED ORDER — INSULIN GLARGINE 100 UNIT/ML ~~LOC~~ SOLN
10.0000 [IU] | Freq: Once | SUBCUTANEOUS | Status: AC
  Administered 2023-08-08: 10 [IU] via SUBCUTANEOUS
  Filled 2023-08-08: qty 0.1

## 2023-08-08 MED ORDER — INSULIN ASPART 100 UNIT/ML IJ SOLN
0.0000 [IU] | Freq: Three times a day (TID) | INTRAMUSCULAR | Status: DC
  Administered 2023-08-08: 15 [IU] via SUBCUTANEOUS
  Administered 2023-08-08: 11 [IU] via SUBCUTANEOUS
  Administered 2023-08-09 (×2): 2 [IU] via SUBCUTANEOUS
  Administered 2023-08-10: 3 [IU] via SUBCUTANEOUS
  Administered 2023-08-10: 11 [IU] via SUBCUTANEOUS
  Administered 2023-08-10: 15 [IU] via SUBCUTANEOUS
  Administered 2023-08-11 (×2): 5 [IU] via SUBCUTANEOUS
  Administered 2023-08-12: 2 [IU] via SUBCUTANEOUS
  Administered 2023-08-12 (×2): 3 [IU] via SUBCUTANEOUS

## 2023-08-08 MED ORDER — INSULIN GLARGINE 100 UNIT/ML ~~LOC~~ SOLN
25.0000 [IU] | Freq: Every day | SUBCUTANEOUS | Status: DC
Start: 2023-08-09 — End: 2023-08-10
  Administered 2023-08-09: 25 [IU] via SUBCUTANEOUS
  Filled 2023-08-08 (×2): qty 0.25

## 2023-08-08 MED ORDER — CALCITRIOL 0.5 MCG PO CAPS
2.2500 ug | ORAL_CAPSULE | ORAL | Status: DC
  Administered 2023-08-08 – 2023-08-13 (×3): 2.25 ug via ORAL
  Filled 2023-08-08 (×4): qty 1

## 2023-08-08 MED ORDER — INSULIN GLARGINE 100 UNIT/ML ~~LOC~~ SOLN: 25.0000 [IU] | Freq: Every day | SUBCUTANEOUS | Status: DC

## 2023-08-08 MED ORDER — CHLORHEXIDINE GLUCONATE CLOTH 2 % EX PADS
6.0000 | MEDICATED_PAD | Freq: Every day | CUTANEOUS | Status: DC
  Administered 2023-08-08 – 2023-08-10 (×3): 6 via TOPICAL

## 2023-08-08 MED ORDER — INSULIN ASPART 100 UNIT/ML IJ SOLN
0.0000 [IU] | Freq: Every day | INTRAMUSCULAR | Status: DC
  Administered 2023-08-11: 2 [IU] via SUBCUTANEOUS

## 2023-08-08 MED ORDER — INSULIN GLARGINE-YFGN 100 UNIT/ML ~~LOC~~ SOPN: 10.0000 [IU] | PEN_INJECTOR | Freq: Once | SUBCUTANEOUS | Status: DC

## 2023-08-08 NOTE — Progress Notes (Signed)
 [facility] Progress Note   Subjective:   Reports chills are better, still having back pain and lower abdominal pain. Reports stable SOB, denies CP, dizziness.   Objective Vitals:   08/07/23 1709 08/07/23 1953 08/08/23 0429 08/08/23 0759  BP: 119/61 (!) 127/112 117/62 117/70  Pulse: 90 90 88 90  Resp: 18 (!) 23 17 20   Temp:  98.2 F (36.8 C) 98.1 F (36.7 C) 98.2 F (36.8 C)  TempSrc:  Oral Oral Oral  SpO2: 100% 99% 99% 99%  Weight:      Height:       Physical Exam General: alert female in NAD Heart: RRR, no murmurs, rubs or gallops Lungs: CTA bilaterally, respirations unlabored on RA Abdomen: Soft, non-distended, +BS Extremities: No edema b/l lower extremities Dialysis Access:  [facility]  Additional Objective Labs: Basic Metabolic Panel: Recent Labs  Lab 08/06/23 0350 08/07/23 0628 08/08/23 0253  NA 130* 132* 127*  K 4.0 3.3* 3.7  CL 91* 93* 89*  CO2 22 24 23   GLUCOSE 130* 103* 352*  BUN 41* 22 43*  CREATININE 4.63* 3.25* 4.85*  CALCIUM 9.0 8.6* 8.3*  PHOS  --  3.5 4.3   Liver Function Tests: Recent Labs  Lab 08/05/23 1440 08/07/23 0628 08/08/23 0253  AST 48*  --   --   ALT 25  --   --   ALKPHOS 138*  --   --   BILITOT 0.9  --   --   PROT 6.2*  --   --   ALBUMIN 2.4* 2.2* 2.0*   Recent Labs  Lab 08/05/23 1440  LIPASE 17   CBC: Recent Labs  Lab 08/05/23 1440 08/06/23 0350 08/07/23 0628 08/08/23 0253  WBC 16.7* 13.7* 11.3* 7.9  NEUTROABS 14.7*  --   --   --   HGB 9.8* 9.7* 9.9* 8.8*  HCT 29.8* 29.7* 30.0* 27.7*  MCV 92.0 92.0 90.4 92.3  PLT 255 221 258 240   Blood Culture    Component Value Date/Time   SDES BLOOD LEFT HAND 08/07/2023 1110   SPECREQUEST  08/07/2023 1110    BOTTLES DRAWN AEROBIC AND ANAEROBIC Blood Culture results may not be optimal due to an inadequate volume of blood received in culture bottles   CULT  08/07/2023 1110    NO GROWTH < 24 HOURS Performed at [facility], [address],  [place], [place] [address]    REPTSTATUS PENDING 08/07/2023 1110    Cardiac Enzymes: No results for input(s): "CKTOTAL", "CKMB", "CKMBINDEX", "TROPONINI" in the last 168 hours. CBG: Recent Labs  Lab 08/07/23 0625 08/07/23 1115 08/07/23 1656 08/07/23 1954 08/08/23 0741  GLUCAP 102* 148* 198* 303* 385*   Iron Studies:  Recent Labs    08/07/23 0628  IRON 24*  TIBC 175*  FERRITIN 2,289*   @lablastinr3 @ Studies/Results: No results found. Medications:  meropenem (MERREM) IV 200 mL/hr at 08/07/23 1632    amiodarone  200 mg Oral Daily   apixaban  5 mg Oral BID   atorvastatin  80 mg Oral Daily   Chlorhexidine Gluconate Cloth  6 each Topical Q0600   Chlorhexidine Gluconate Cloth  6 each Topical Q0600   clonazePAM  0.5 mg Oral QHS   insulin aspart  0-5 Units Subcutaneous QHS   insulin aspart  0-9 Units Subcutaneous TID WC   insulin glargine  15 Units Subcutaneous Daily   levothyroxine  50 mcg Oral QAC breakfast   melatonin  3 mg Oral QHS   midodrine  10-20 mg Oral Q T,Th,Sa-HD   multivitamin  1 tablet Oral QHS   pantoprazole  40 mg Oral BID AC   sevelamer carbonate  1,600 mg Oral TID WC   venlafaxine XR  75 mg Oral Q breakfast    Dialysis Orders: [facility] TTSa. F180. 2K, 2Ca. Duration: 4h. EDW 77 kg. Access: R subclavian HD catheter. Mircera 50 mcg last given 02/22. Receives calcitriol 2.25 mcg with each session.   Assessment/Plan: # ESRD: On TTSa HD. No recent missed sessions. Initially hypokalemic on presentation but this corrected with supplementation. Renal function stable. HD today per regular schedule.    # Hyponatremia: S/p 500 cc NS in ED. UF with HD as tolerated   # Volume/hypertension: Variable weights. Appears volume status is improving with HD. Continue to challenge EDW as tolerated.    # Anemia of Chronic Kidney Disease: Hemoglobin 8.8. Currently receiving venofer 100 mg TID with HD-holding now due to acute infection.    # Secondary  Hyperparathyroidism/Hyperphosphatemia: Calcium and phos at goal. Receives calcitriol 2.25 mcg TID with HD, resumed here. Continue renvela.    # Vascular access: R tunneled subclavian catheter. Clean, dry dressing overlaying skin entry. She states this has been there for about 1.5 years. 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 Most recent venography 07/31/2023. Consider catheter exchange after 48 hours of abx. Not planning for line holiday at this time.   # E. coli bacteremia and UTI: On ceftriaxone. HDS. Urine culture and blood cultures positive for e.coli, see above.   # Paroxysmal atrial fibrillation: Rate controlled. On home amiodarone, eliquis. Per primary.  Rogers Blocker, PA-C 08/08/2023, 9:06 AM  [facility] Pager: [phone]

## 2023-08-08 NOTE — Evaluation (Signed)
 Physical Therapy Evaluation Patient Details Name: Susan [name] MRN: [id number] DOB: 1950-06-10 Today's Date: 08/08/2023  History of Present Illness  Pt is a 73 y/o female presenting on 3/13 with abdominal pain, dysuria, and back pain. UA consistent with UTI, E. coli bacteremia. PMH includes: ESRD on TTS HD, PAF on eliquis, CAD s/p PCI, chronic HFrER, DM2, HTN, anemia of chronic disease, R BKA.  Clinical Impression  Pt admitted with above diagnosis. Was working with outpatient therapy PTA, ambulating short distances with RLE prosthesis, using a RW, up to mod assist with turns per prior notes. Currently (with great encouragement) patient was able to mobilize out of bed and transfer with min assist while wearing RLE prosthesis. Complains of lower back pain that does not seem to worsen with mobility. Educated on importance of LE exercises and frequent OOB activity while admitted with staff assist. She is eager to return to OPPT and we will aim for this with acute care goals. Pt currently with functional limitations due to the deficits listed below (see PT Problem List). Pt will benefit from acute skilled PT to increase their independence and safety with mobility to allow discharge.           If plan is discharge home, recommend the following: A little help with walking and/or transfers;A little help with bathing/dressing/bathroom;Direct supervision/assist for medications management;Direct supervision/assist for financial management;Assistance with cooking/housework;Help with stairs or ramp for entrance;Assist for transportation   Can travel by private vehicle        Equipment Recommendations None recommended by PT  Recommendations for Other Services       Functional Status Assessment Patient has had a recent decline in their functional status and demonstrates the ability to make significant improvements in function in a reasonable and predictable amount of time.     Precautions / Restrictions  Precautions Precautions: Fall Recall of Precautions/Restrictions: Intact Required Braces or Orthoses:  (R prosthetic) Restrictions Weight Bearing Restrictions Per Provider Order: No      Mobility  Bed Mobility Overal bed mobility: Needs Assistance Bed Mobility: Rolling, Sidelying to Sit Rolling: Min assist, Used rails Sidelying to sit: Min assist, Used rails       General bed mobility comments: Educated on log roll technique with use of rail, cues for sequencing and min assist to roll and rise to EOB. Fearful but denies increase in pain with this technique.    Transfers Overall transfer level: Needs assistance Equipment used: Rolling walker (2 wheels) Transfers: Sit to/from Stand, Bed to chair/wheelchair/BSC Sit to Stand: Min assist   Step pivot transfers: Min assist       General transfer comment: Min assist for boost to stand from bed and again from recliner. Min assist for balance (leaning backwards) and RW control to step pivot. Cues throughout.    Ambulation/Gait                  Stairs            Wheelchair Mobility     Tilt Bed    Modified Rankin (Stroke Patients Only)       Balance Overall balance assessment: Needs assistance Sitting-balance support: No upper extremity supported, Feet unsupported Sitting balance-Leahy Scale: Fair Sitting balance - Comments: CGA initially, progressed to supervision.   Standing balance support: Bilateral upper extremity supported, Reliant on assistive device for balance Standing balance-Leahy Scale: Poor  Pertinent Vitals/Pain Pain Assessment Pain Assessment: Faces Faces Pain Scale: Hurts little more Pain Location: back Pain Descriptors / Indicators: Discomfort Pain Intervention(s): Monitored during session, Repositioned    Home Living Family/patient expects to be discharged to:: Private residence Living Arrangements: Spouse/significant other Available  Help at Discharge: Family;Available 24 hours/day Type of Home: Mobile home Home Access: Ramped entrance       Home Layout: One level Home Equipment: Grab bars - tub/shower;Rolling Walker (2 wheels);Rollator (4 wheels);BSC/3in1;Cane - quad;Shower seat;Wheelchair - manual;Hospital bed Additional Comments: Husband was out on [employer].Pt has recieved her RLE prosthesis.    Prior Function Prior Level of Function : Needs assist             Mobility Comments: reports has been transferring with spouse assist at home, stand step from EOB to wheelchair using RW. Walking 15 feet at OPPT; needs up to mod assist for turns with RW. ADLs Comments: Requires assist for ADLs bed level, pt reports self feeding, grooming and managing UB ADLs. Using brief to have BM and husband A with cleaning, rolling in bed to assist with spouse donning LB clothing.     Extremity/Trunk Assessment   Upper Extremity Assessment Upper Extremity Assessment: Defer to OT evaluation    Lower Extremity Assessment Lower Extremity Assessment: Generalized weakness (Has Rt BKA with prosthesis)       Communication   Communication Communication: No apparent difficulties    Cognition Arousal: Alert Behavior During Therapy: WFL for tasks assessed/performed   PT - Cognitive impairments: No family/caregiver present to determine baseline, Awareness, Memory, Problem solving, Safety/Judgement                         Following commands: Intact       Cueing Cueing Techniques: Verbal cues     General Comments General comments (skin integrity, edema, etc.): Required encouragement and education on importance of mobility. Pt fearful that back pain will limit any OOB activity but she was able to perform all tasks today without increase in back pain. Needs full assist to donne RLE prosthesis, but does verbalize understanding of order donning each item.    Exercises     Assessment/Plan    PT Assessment Patient needs  continued PT services  PT Problem List Decreased strength;Decreased activity tolerance;Decreased mobility;Decreased balance;Decreased range of motion;Decreased cognition;Decreased knowledge of use of DME;Decreased safety awareness;Decreased knowledge of precautions;Cardiopulmonary status limiting activity;Pain;Impaired sensation       PT Treatment Interventions DME instruction;Gait training;Functional mobility training;Therapeutic activities;Therapeutic exercise;Balance training;Neuromuscular re-education;Cognitive remediation;Patient/family education;Wheelchair mobility training    PT Goals (Current goals can be found in the Care Plan section)  Acute Rehab PT Goals Patient Stated Goal: Get well, return to outpatient rehab PT Goal Formulation: With patient Time For Goal Achievement: 08/22/23 Potential to Achieve Goals: Good    Frequency Min 1X/week     Co-evaluation               AM-PAC PT "6 Clicks" Mobility  Outcome Measure Help needed turning from your back to your side while in a flat bed without using bedrails?: A Little Help needed moving from lying on your back to sitting on the side of a flat bed without using bedrails?: A Little Help needed moving to and from a bed to a chair (including a wheelchair)?: A Little Help needed standing up from a chair using your arms (e.g., wheelchair or bedside chair)?: A Little Help needed to walk in hospital room?: A Lot  Help needed climbing 3-5 steps with a railing? : Total 6 Click Score: 15    End of Session Equipment Utilized During Treatment: Gait belt;Other (comment) (RLE prosthesis) Activity Tolerance: Patient tolerated treatment well Patient left: in chair;with call bell/phone within reach;with chair alarm set Nurse Communication: Mobility status PT Visit Diagnosis: Unsteadiness on feet (R26.81);Muscle weakness (generalized) (M62.81);Difficulty in walking, not elsewhere classified (R26.2);Pain Pain - part of body:  (back)     Time: 2956-2130 PT Time Calculation (min) (ACUTE ONLY): 29 min   Charges:   PT Evaluation $PT Eval Low Complexity: 1 Low PT Treatments $Therapeutic Activity: 8-22 mins PT General Charges $$ ACUTE PT VISIT: 1 Visit         Kathlyn Sacramento, PT, DPT [facility]  Rehabilitation Services Physical Therapist Office: [phone] Website: [email or web address]   Berton Mount 08/08/2023, 10:28 AM

## 2023-08-08 NOTE — Progress Notes (Signed)
 Susan [name]  WUJ:[id number] DOB: 01/01/1951 DOA: 08/05/2023 PCP: Donita Brooks, MD    Brief Narrative:  73 year old with a history of ESRD on HD TTS, PAF on Eliquis, CAD status post PCI, chronic systolic CHF with EF 30-35%, DM2, HTN, HLD, hypothyroidism, anemia of CKD, right BKA, and depression/anxiety who presented to the ER with 24 hours of dysuria associated with abdominal and back pain as well as intermittent shaking chills.  In the ER her blood pressure was 76/41 with a temperature of 98.1 and a WBC of 16.7.  UA was suggestive of UTI.  Goals of Care:   Code Status: Full Code   DVT prophylaxis: apixaban (ELIQUIS) tablet 5 mg   Interim Hx: Afebrile.  Vital signs stable.  CBG elevated.  Sitting up in a bedside chair eating lunch at the time of visit.  In good spirits.  States she is beginning to feel somewhat better.  Intake improving.  Assessment & Plan:  Complicated ESBL E. coli UTI - E. 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 - continue directed antibiotic therapy -repeat blood cultures 3/14 after initiation of therapy thus far no growth  Hypotension Resolved with simple volume resuscitation  Hypokalemia Corrected with gentle replacement -stable with ongoing HD  Hyponatremia Hypovolemic in nature -should stabilize with ongoing HD -monitor  ESRD on HD TTS Nephrology attending to dialysis needs  PAF on chronic Eliquis Heart rate controlled - continue DOAC  Chronic systolic congestive heart failure No acute exacerbation with patient actually dehydrated at presentation -volume control per dialysis  CAD Quiescent  Anemia of CKD Stable  DM2 CBG now poorly controlled with increasing intake - adjust therapy today and follow  Hypothyroidism Continue usual Synthroid dose  HLD Continue usual atorvastatin dose  Depression/anxiety Continue usual home  medications  Obesity Class II - Body mass index is 38.44 kg/m.    Family Communication: No family present at time of exam today Disposition: Eventual discharge home    Objective: Blood pressure 117/70, pulse 90, temperature 98.2 F (36.8 C), temperature source Oral, resp. rate 20, height 5\' 5"  (1.651 m), weight 104.8 kg, SpO2 99%.  Intake/Output Summary (Last 24 hours) at 08/08/2023 0929 Last data filed at 08/07/2023 1953 Gross per 24 hour  Intake 280 ml  Output 0 ml  Net 280 ml   Filed Weights   08/05/23 1426  Weight: 104.8 kg    Examination: General: No acute respiratory distress Lungs: Clear to auscultation bilaterally -no wheezing Cardiovascular: Regular rate and rhythm without murmur gallop or rub  Abdomen: NT/ND, soft, BS positive, no rebound Extremities: No significant cyanosis, clubbing, or edema bilateral lower extremities  CBC: Recent Labs  Lab 08/05/23 1440 08/06/23 0350 08/07/23 0628 08/08/23 0253  WBC 16.7* 13.7* 11.3* 7.9  NEUTROABS 14.7*  --   --   --   HGB 9.8* 9.7* 9.9* 8.8*  HCT 29.8* 29.7* 30.0* 27.7*  MCV 92.0 92.0 90.4 92.3  PLT 255 221 258 240   Basic Metabolic Panel: Recent Labs  Lab 08/05/23 1502 08/06/23 0350 08/07/23 0628 08/08/23 0253  NA  --  130* 132* 127*  K  --  4.0 3.3* 3.7  CL  --  91* 93* 89*  CO2  --  22 24 23   GLUCOSE  --  130* 103* 352*  BUN  --  41* 22 43*  CREATININE  --  4.63* 3.25* 4.85*  CALCIUM  --  9.0 8.6* 8.3*  MG 2.0  --   --  2.1  PHOS  --   --  3.5 4.3   GFR: Estimated Creatinine Clearance: 12.2 mL/min (A) (by C-G formula based on SCr of 4.85 mg/dL (H)).   Scheduled Meds:  amiodarone  200 mg Oral Daily   apixaban  5 mg Oral BID   atorvastatin  80 mg Oral Daily   calcitRIOL  2.25 mcg Oral Q T,Th,Sa-HD   Chlorhexidine Gluconate Cloth  6 each Topical Q0600   Chlorhexidine Gluconate Cloth  6 each Topical Q0600   clonazePAM  0.5 mg Oral QHS   insulin aspart  0-5 Units Subcutaneous QHS   insulin  aspart  0-9 Units Subcutaneous TID WC   insulin glargine  15 Units Subcutaneous Daily   levothyroxine  50 mcg Oral QAC breakfast   melatonin  3 mg Oral QHS   midodrine  10-20 mg Oral Q T,Th,Sa-HD   multivitamin  1 tablet Oral QHS   pantoprazole  40 mg Oral BID AC   sevelamer carbonate  1,600 mg Oral TID WC   venlafaxine XR  75 mg Oral Q breakfast   Continuous Infusions:  meropenem (MERREM) IV 200 mL/hr at 08/07/23 1632     LOS: 2 days   Lonia Blood, MD [facility] Office  [phone] Pager - Text Page per Loretha Stapler  If 7PM-7AM, please contact night-coverage per Amion 08/08/2023, 9:29 AM

## 2023-08-09 ENCOUNTER — Other Ambulatory Visit: Payer: Self-pay | Admitting: Interventional Radiology

## 2023-08-09 ENCOUNTER — Inpatient Hospital Stay (HOSPITAL_COMMUNITY)

## 2023-08-09 DIAGNOSIS — Z01818 Encounter for other preprocedural examination: Secondary | ICD-10-CM

## 2023-08-09 DIAGNOSIS — N390 Urinary tract infection, site not specified: Secondary | ICD-10-CM | POA: Diagnosis not present

## 2023-08-09 LAB — GLUCOSE, CAPILLARY
Glucose-Capillary: 129 mg/dL — ABNORMAL HIGH (ref 70–99)
Glucose-Capillary: 125 mg/dL — ABNORMAL HIGH (ref 70–99)
Glucose-Capillary: 128 mg/dL — ABNORMAL HIGH (ref 70–99)
Glucose-Capillary: 107 mg/dL — ABNORMAL HIGH (ref 70–99)
Glucose-Capillary: 200 mg/dL — ABNORMAL HIGH (ref 70–99)

## 2023-08-09 LAB — RENAL FUNCTION PANEL
Albumin: 2.1 g/dL — ABNORMAL LOW (ref 3.5–5.0)
Anion gap: 11 (ref 5–15)
BUN: 21 mg/dL (ref 8–23)
CO2: 26 mmol/L (ref 22–32)
Calcium: 8.7 mg/dL — ABNORMAL LOW (ref 8.9–10.3)
Chloride: 96 mmol/L — ABNORMAL LOW (ref 98–111)
Creatinine, Ser: 2.85 mg/dL — ABNORMAL HIGH (ref 0.44–1.00)
GFR, Estimated: 17 mL/min — ABNORMAL LOW (ref >60)
Glucose, Bld: 130 mg/dL — ABNORMAL HIGH (ref 70–99)
Phosphorus: 2.3 mg/dL — ABNORMAL LOW (ref 2.5–4.6)
Potassium: 3.4 mmol/L — ABNORMAL LOW (ref 3.5–5.1)
Sodium: 133 mmol/L — ABNORMAL LOW (ref 135–145)

## 2023-08-09 LAB — CBC
HCT: 28.5 % — ABNORMAL LOW (ref 36.0–46.0)
Hemoglobin: 9.2 g/dL — ABNORMAL LOW (ref 12.0–15.0)
MCH: 29.3 pg (ref 26.0–34.0)
MCHC: 32.3 g/dL (ref 30.0–36.0)
MCV: 90.8 fL (ref 80.0–100.0)
Platelets: 258 10*3/uL (ref 150–400)
RBC: 3.14 MIL/uL — ABNORMAL LOW (ref 3.87–5.11)
RDW: 14.6 % (ref 11.5–15.5)
WBC: 7.5 10*3/uL (ref 4.0–10.5)
nRBC: 0 % (ref 0.0–0.2)

## 2023-08-09 MED ORDER — IOHEXOL 350 MG/ML SOLN
75.0000 mL | Freq: Once | INTRAVENOUS | Status: AC | PRN
  Administered 2023-08-09: 75 mL via INTRAVENOUS

## 2023-08-09 NOTE — Progress Notes (Signed)
 USGPIV placed in upper left cephalic vein for CT scan.  I called CT to see if they would use the current 20 ga IV in the LAC with good blood return, in an effort to preserve this patient's veins, but they stated it was too old to use (max 72 hrs).

## 2023-08-09 NOTE — Plan of Care (Signed)
 Patient AAOx4. NC2L in place. Stage 2 sacrum, dressing C/D/I. New IV placed for CT contrast. RACW permacath. Patient complained of nausea, prn compazine given. Safety precautions maintained.     Problem: Skin Integrity: Goal: Risk for impaired skin integrity will decrease Outcome: Progressing   Problem: Health Behavior/Discharge Planning: Goal: Ability to manage health-related needs will improve Outcome: Progressing

## 2023-08-09 NOTE — Progress Notes (Signed)
 Susan [name]  GUY:[id number] DOB: Jun 30, 1950 DOA: 08/05/2023 PCP: Donita Brooks, MD    Brief Narrative:  73 year old with a history of ESRD on HD TTS, PAF on Eliquis, CAD status post PCI, chronic systolic CHF with EF 30-35%, DM2, HTN, HLD, hypothyroidism, anemia of CKD, right BKA, and depression/anxiety who presented to the ER with 24 hours of dysuria associated with abdominal and back pain as well as intermittent shaking chills.  In the ER her blood pressure was 76/41 with a temperature of 98.1 and a WBC of 16.7.  UA was suggestive of UTI.  Goals of Care:   Code Status: Full Code   DVT prophylaxis: apixaban (ELIQUIS) tablet 5 mg   Interim Hx: No acute events recorded overnight.  Afebrile.  Vital signs stable.  Reports persisting left-sided flank pain which at times radiates down into her pelvis/suprapubic region.  Denies vomiting but reports persisting nausea.  Overall she feels that she is only slightly better than when she initially presented.  No chest pain or shortness of breath.  Assessment & Plan:  Complicated ESBL E. coli UTI - E. 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 - continue directed antibiotic therapy -repeat blood cultures 3/14 after initiation of therapy thus far no growth x 2 days -given persistence of abdominal symptoms we will repeat CT abdomen to rule out a complication such as abscess  Hypotension Resolved with simple volume resuscitation  Hypokalemia Corrected with gentle replacement - stable with ongoing HD  Hyponatremia Hypovolemic in nature -improving with ongoing HD -monitor  ESRD on HD TTS Nephrology attending to dialysis needs - Nephrology has asked IR to swap out her dialysis catheter in setting of recent bacteremia  PAF on chronic Eliquis Heart rate controlled - continue DOAC  Chronic systolic congestive heart  failure No acute exacerbation with patient actually dehydrated at presentation - volume control per dialysis  CAD Quiescent  Anemia of CKD Hemoglobin stable  DM2 CBG control improved today -continue to monitor  Hypothyroidism Continue usual Synthroid dose  HLD Continue usual atorvastatin dose  Depression/anxiety Continue usual home medications  Obesity Class II - Body mass index is 27.73 kg/m.    Family Communication: No family present at time of exam today Disposition: Eventual discharge home    Objective: Blood pressure (!) 101/33, pulse 76, temperature (!) 97.5 F (36.4 C), resp. rate 18, height 5\' 5"  (1.651 m), weight 75.6 kg, SpO2 100%.  Intake/Output Summary (Last 24 hours) at 08/09/2023 0941 Last data filed at 08/09/2023 0037 Gross per 24 hour  Intake 340 ml  Output 2000 ml  Net -1660 ml   Filed Weights   08/05/23 1426 08/08/23 2038  Weight: 104.8 kg 75.6 kg    Examination: General: No acute respiratory distress Lungs: Clear to auscultation bilaterally -no wheezing Cardiovascular: Regular rate and rhythm without murmur gallop or rub  Abdomen: No severe tenderness on palpation, nondistended, soft, BS positive, no appreciable mass Extremities: No significant cyanosis, clubbing, or edema bilateral lower extremities  CBC: Recent Labs  Lab 08/05/23 1440 08/06/23 0350 08/07/23 0628 08/08/23 0253 08/09/23 0528  WBC 16.7*   < > 11.3* 7.9 7.5  NEUTROABS 14.7*  --   --   --   --   HGB 9.8*   < > 9.9* 8.8* 9.2*  HCT 29.8*   < > 30.0* 27.7* 28.5*  MCV 92.0   < >  90.4 92.3 90.8  PLT 255   < > 258 240 258   < > = values in this interval not displayed.   Basic Metabolic Panel: Recent Labs  Lab 08/05/23 1502 08/06/23 0350 08/07/23 0628 08/08/23 0253 08/09/23 0528  NA  --    < > 132* 127* 133*  K  --    < > 3.3* 3.7 3.4*  CL  --    < > 93* 89* 96*  CO2  --    < > 24 23 26   GLUCOSE  --    < > 103* 352* 130*  BUN  --    < > 22 43* 21  CREATININE   --    < > 3.25* 4.85* 2.85*  CALCIUM  --    < > 8.6* 8.3* 8.7*  MG 2.0  --   --  2.1  --   PHOS  --   --  3.5 4.3 2.3*   < > = values in this interval not displayed.   GFR: Estimated Creatinine Clearance: 17.6 mL/min (A) (by C-G formula based on SCr of 2.85 mg/dL (H)).   Scheduled Meds:  amiodarone  200 mg Oral Daily   apixaban  5 mg Oral BID   atorvastatin  80 mg Oral Daily   calcitRIOL  2.25 mcg Oral Q T,Th,Sa-HD   Chlorhexidine Gluconate Cloth  6 each Topical Q0600   Chlorhexidine Gluconate Cloth  6 each Topical Q0600   clonazePAM  0.5 mg Oral QHS   insulin aspart  0-15 Units Subcutaneous TID WC   insulin aspart  0-5 Units Subcutaneous QHS   insulin glargine  25 Units Subcutaneous Daily   levothyroxine  50 mcg Oral QAC breakfast   melatonin  3 mg Oral QHS   midodrine  10-20 mg Oral Q T,Th,Sa-HD   multivitamin  1 tablet Oral QHS   pantoprazole  40 mg Oral BID AC   sevelamer carbonate  1,600 mg Oral TID WC   venlafaxine XR  75 mg Oral Q breakfast   Continuous Infusions:  meropenem (MERREM) IV 1 g (08/09/23 0151)     LOS: 3 days   Lonia Blood, MD [facility] Office  [phone] Pager - Text Page per Loretha Stapler  If 7PM-7AM, please contact night-coverage per Amion 08/09/2023, 9:41 AM

## 2023-08-09 NOTE — Progress Notes (Signed)
 [facility] Progress Note   Subjective:   Had HD overnight. Reports she feels short of breath today, reports HD did not help with her breathing. Reports ongoing back pain and abdominal pain. Denies CP, palpitations, dizziness, N/V.  Objective Vitals:   08/09/23 0037 08/09/23 0120 08/09/23 0440 08/09/23 0813  BP: (!) 154/142 (!) 140/51 (!) 99/39 (!) 101/33  Pulse: 86 86 78 76  Resp: (!) 22 18 18 18   Temp:  98.1 F (36.7 C) (!) 97.5 F (36.4 C)   TempSrc:  Oral    SpO2: 100% 98% 100% 100%  Weight:      Height:       Physical Exam General: alert female in NAD Heart: RRR, no murmurs, rubs or gallops Lungs: CTA bilaterally, respirations unlabored on RA Abdomen: Soft, non-distended, +BS Extremities: No edema b/l lower extremities Dialysis Access:  [facility] with intact bandage  Additional Objective Labs: Basic Metabolic Panel: Recent Labs  Lab 08/07/23 0628 08/08/23 0253 08/09/23 0528  NA 132* 127* 133*  K 3.3* 3.7 3.4*  CL 93* 89* 96*  CO2 24 23 26   GLUCOSE 103* 352* 130*  BUN 22 43* 21  CREATININE 3.25* 4.85* 2.85*  CALCIUM 8.6* 8.3* 8.7*  PHOS 3.5 4.3 2.3*   Liver Function Tests: Recent Labs  Lab 08/05/23 1440 08/07/23 0628 08/08/23 0253 08/09/23 0528  AST 48*  --   --   --   ALT 25  --   --   --   ALKPHOS 138*  --   --   --   BILITOT 0.9  --   --   --   PROT 6.2*  --   --   --   ALBUMIN 2.4* 2.2* 2.0* 2.1*   Recent Labs  Lab 08/05/23 1440  LIPASE 17   CBC: Recent Labs  Lab 08/05/23 1440 08/06/23 0350 08/07/23 0628 08/08/23 0253 08/09/23 0528  WBC 16.7* 13.7* 11.3* 7.9 7.5  NEUTROABS 14.7*  --   --   --   --   HGB 9.8* 9.7* 9.9* 8.8* 9.2*  HCT 29.8* 29.7* 30.0* 27.7* 28.5*  MCV 92.0 92.0 90.4 92.3 90.8  PLT 255 221 258 240 258   Blood Culture    Component Value Date/Time   SDES BLOOD LEFT HAND 08/07/2023 1110   SPECREQUEST  08/07/2023 1110    BOTTLES DRAWN AEROBIC AND ANAEROBIC Blood Culture results may not be optimal due to  an inadequate volume of blood received in culture bottles   CULT  08/07/2023 1110    NO GROWTH 2 DAYS Performed at [facility], [address] [place], [place] [address]    REPTSTATUS PENDING 08/07/2023 1110    Cardiac Enzymes: No results for input(s): "CKTOTAL", "CKMB", "CKMBINDEX", "TROPONINI" in the last 168 hours. CBG: Recent Labs  Lab 08/08/23 0741 08/08/23 1151 08/08/23 1617 08/09/23 0122 08/09/23 0814  GLUCAP 385* 342* 371* 129* 125*   Iron Studies:  Recent Labs    08/07/23 0628  IRON 24*  TIBC 175*  FERRITIN 2,289*   @lablastinr3 @ Studies/Results: No results found. Medications:  meropenem (MERREM) IV 1 g (08/09/23 0151)    amiodarone  200 mg Oral Daily   apixaban  5 mg Oral BID   atorvastatin  80 mg Oral Daily   calcitRIOL  2.25 mcg Oral Q T,Th,Sa-HD   Chlorhexidine Gluconate Cloth  6 each Topical Q0600   Chlorhexidine Gluconate Cloth  6 each Topical Q0600   clonazePAM  0.5 mg Oral QHS  insulin aspart  0-15 Units Subcutaneous TID WC   insulin aspart  0-5 Units Subcutaneous QHS   insulin glargine  25 Units Subcutaneous Daily   levothyroxine  50 mcg Oral QAC breakfast   melatonin  3 mg Oral QHS   midodrine  10-20 mg Oral Q T,Th,Sa-HD   multivitamin  1 tablet Oral QHS   pantoprazole  40 mg Oral BID AC   sevelamer carbonate  1,600 mg Oral TID WC   venlafaxine XR  75 mg Oral Q breakfast    Dialysis Orders: [facility]. F180. 2K, 2Ca. Duration: 4h. EDW 77 kg. Access: R subclavian HD catheter. Mircera 50 mcg last given 02/22. Receives calcitriol 2.25 mcg with each session.   Assessment/Plan: # ESRD: On [facility] HD. No recent missed sessions. Initially hypokalemic on presentation but this corrected with supplementation. Renal function stable. HD today per regular schedule.    # Hyponatremia: Likely volume related, now improved. UF with HD as tolerated   # Volume/hypertension: Variable weights. Appears volume status is improving with HD. Continue  to challenge EDW as tolerated. Intermittent dyspnea not improving with HD, consider CXR if it persists.    # Anemia of Chronic Kidney Disease: Hemoglobin 9.2. Currently receiving venofer 100 mg TID with HD-holding now due to acute infection.    # Secondary Hyperparathyroidism/Hyperphosphatemia: Calcium and phos at goal. Receives calcitriol 2.25 mcg TID with HD, resumed here. Continue renvela.    # Vascular access: R tunneled subclavian catheter. Clean, dry dressing overlaying skin entry. She states this has been there for about 1.5 years. 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 Most recent venography 07/31/2023. Consider catheter exchange after 48 hours of abx. Not planning for line holiday at this time.   # E. coli bacteremia and UTI: On ceftriaxone. HDS. Urine culture and blood cultures positive for e.coli, see above.   # Paroxysmal atrial fibrillation: Rate controlled. On home amiodarone, eliquis. Per primary.  Rogers Blocker, PA-C 08/09/2023, 9:10 AM  [facility] Pager: [phone]

## 2023-08-09 NOTE — Consult Note (Signed)
 Chief Complaint: Patient was seen in consultation today for Urosepsis with E.coli bacteremia, and with consideration for tunneled dialysis catheter exchange.  Referring Provider(s): Dr. Louie Bun, MD  Supervising Physician: Irish Lack  Patient Status: [facility] - In-pt  Patient is Full Code  History of Present Illness: Susan [name] is a 73 y.o. female with PMHx notable for HTN, HLD, CAD, MI (2000, 2007), chronic diastolic heart failure, PAF, RBBB, CVA, DMT2, obesity, ESRD on HD, hypothyroidism, arthritis, chronic HA, nephrolithiasis, anxiety, and depression.  Patient presented to ED on 3/12 with 24 hours of dysuria associated with abdominal and back pain, as well as intermittent chills. She was admitted for complicated ESBL E. Coli UTI wit bacteremia, treated with ceftriaxone. She has been undergoing HD without issue via tunneled HD cath (placed 1.5 years ago). Per nephrology progress note today: "Has had multiple AV grafts created in past that have thrombosed and have not been successfully used. Most recent venography 07/31/2023. Consider catheter exchange after 48 hours of abx. Not planning for line holiday at this time."  Interventional Radiology was requested for tunneled dialysis catheter exchange. Patient is tentatively scheduled for same in IR tomorrow.  All labs and medications are within acceptable parameters. Patient is allergic to cephalexin, but tolerating ceftriaxone. Prophylactic vancomycin to be administered pre-operatively.  Patient will be NPO at midnight.    Currently without any significant complaints. Patient alert and laying in bed,calm. Denies any fevers, headache, chest pain, SOB, cough, abdominal pain, nausea, vomiting or bleeding.     Past Medical History:  Diagnosis Date   Anemia    hx   Anxiety    Arthritis    "generalized" (03/15/2014)   CAD (coronary artery disease)    MI in 2000 - MI  2007 - treated bare metal stent (no nuclear since then as  9/11)   Carotid artery disease (HCC)    Chronic diastolic heart failure (HCC)    a) ECHO (08/2013) EF 55-60% and RV function nl b) RHC (08/2013) RA 4, RV 30/5/7, PA 25/10 (16), PCWP 7, Fick CO/CI 6.3/2.7, PVR 1.5 WU, PA 61 and 66%   Daily headache    "~ every other day; since I fell in June" (03/15/2014)   Depression    Diabetic retinopathy (HCC)    Dyslipidemia    ESRD (end stage renal disease) (HCC)    Dialysis on Tues Thurs Sat   Exertional shortness of breath    GERD (gastroesophageal reflux disease)    History of blood transfusion    History of kidney stones    HTN (hypertension)    Hypothyroidism    Myocardial infarction (HCC)    Obesity    Osteoarthritis    PAF (paroxysmal atrial fibrillation) (HCC)    Peripheral neuropathy    bilateral feet/hands   PONV (postoperative nausea and vomiting)    RBBB (right bundle branch block)    Old   Stroke (HCC)    mini strokes   Type II diabetes mellitus (HCC)    Type II, [name]. patient has omnipod insulin pump with Novolin R Insulin    Past Surgical History:  Procedure Laterality Date   A/V FISTULAGRAM Left 11/07/2022   Procedure: A/V Fistulagram;  Surgeon: Leonie Douglas, MD;  [facility];  Service: Cardiovascular;  Laterality: Left;   ABDOMINAL HYSTERECTOMY  1980's   AMPUTATION Right 02/24/2018   Procedure: RIGHT FOOT GREAT TOE AND 2ND TOE AMPUTATION;  Surgeon: Aldean Baker  V, MD;  Location: [facility] OR;  Service: Orthopedics;  Laterality: Right;   AMPUTATION Right 04/30/2018   Procedure: RIGHT TRANSMETATARSAL AMPUTATION;  Surgeon: Nadara Mustard, MD;  Location: [facility] OR;  Service: Orthopedics;  Laterality: Right;   AMPUTATION Right 05/02/2022   Procedure: RIGHT BELOW KNEE AMPUTATION;  Surgeon: Nadara Mustard, MD;  Location: [facility] OR;  Service: Orthopedics;  Laterality: Right;   APPLICATION OF WOUND VAC Right 06/13/2022   Procedure: APPLICATION OF WOUND VAC;  Surgeon: Nadara Mustard, MD;  [facility]: [facility]  OR;  Service: Orthopedics;  Laterality: Right;   APPLICATION OF WOUND VAC Left 11/14/2022   Procedure: APPLICATION OF WOUND VAC;  Surgeon: Nadara Mustard, MD;  [facility]: [facility] OR;  Service: Orthopedics;  Laterality: Left;   AV FISTULA PLACEMENT Left 04/02/2022   Procedure: LEFT ARM ARTERIOVENOUS (AV) FISTULA CREATION;  Surgeon: Nada Libman, MD;  [facility]: [facility] OR;  Service: Vascular;  Laterality: Left;  PERIPHERAL NERVE BLOCK   AV FISTULA PLACEMENT Right 04/27/2023   Procedure: RIGHT ARM BRACHIOBASILIC ARTERIOVENOUS (AV) FISTULA CREATION;  Surgeon: Leonie Douglas, MD;  [facility]: [facility];  Service: Vascular;  Laterality: Right;   BASCILIC VEIN TRANSPOSITION Left 07/31/2022   Procedure: LEFT ARM SECOND STAGE BASILIC VEIN TRANSPOSITION;  Surgeon: Nada Libman, MD;  [facility] OR;  Service: Vascular;  Laterality: Left;   BIOPSY  05/27/2020   Procedure: BIOPSY;  Surgeon: Lanelle Bal, DO;  [facility]: [facility];  Service: Endoscopy;;   CATARACT EXTRACTION, BILATERAL Bilateral ?2013   COLONOSCOPY W/ POLYPECTOMY     COLONOSCOPY WITH PROPOFOL N/A 03/13/2019   Procedure: COLONOSCOPY WITH PROPOFOL;  Surgeon: Beverley Fiedler, MD;  Location: [facility] ENDOSCOPY;  Service: Gastroenterology;  Laterality: N/A;   CORONARY ANGIOPLASTY WITH STENT PLACEMENT  1999; 2007   "1 + 1"   ERCP N/A 02/03/2022   Procedure: ENDOSCOPIC RETROGRADE CHOLANGIOPANCREATOGRAPHY (ERCP);  Surgeon: Jeani Hawking, MD;  Location: [facility] ENDOSCOPY;  Service: Gastroenterology;  Laterality: N/A;   ESOPHAGOGASTRODUODENOSCOPY N/A 02/12/2023   Procedure: ESOPHAGOGASTRODUODENOSCOPY (EGD);  Surgeon: Jeani Hawking, MD;  Location: [facility] ENDOSCOPY;  Service: Gastroenterology;  Laterality: N/A;   ESOPHAGOGASTRODUODENOSCOPY (EGD) WITH PROPOFOL N/A 03/13/2019   Procedure: ESOPHAGOGASTRODUODENOSCOPY (EGD) WITH PROPOFOL;  Surgeon: Beverley Fiedler, MD;  Location: [facility] ENDOSCOPY;  Service: Gastroenterology;  Laterality: N/A;   ESOPHAGOGASTRODUODENOSCOPY (EGD) WITH  PROPOFOL N/A 05/27/2020   Procedure: ESOPHAGOGASTRODUODENOSCOPY (EGD) WITH PROPOFOL;  Surgeon: Lanelle Bal, DO;  [facility];  Service: Endoscopy;  Laterality: N/A;   ESOPHAGOGASTRODUODENOSCOPY (EGD) WITH PROPOFOL N/A 09/03/2022   Procedure: ESOPHAGOGASTRODUODENOSCOPY (EGD) WITH PROPOFOL;  Surgeon: Jeani Hawking, MD;  Location: [facility] ENDOSCOPY;  Service: Gastroenterology;  Laterality: N/A;   EYE SURGERY Bilateral    lazer   FLEXIBLE SIGMOIDOSCOPY N/A 05/23/2022   Procedure: FLEXIBLE SIGMOIDOSCOPY;  Surgeon: Jeani Hawking, MD;  Location: [facility] ENDOSCOPY;  Service: Gastroenterology;  Laterality: N/A;   FLEXIBLE SIGMOIDOSCOPY N/A 05/24/2022   Procedure: FLEXIBLE SIGMOIDOSCOPY;  Surgeon: Imogene Burn, MD;  Location: [facility] ENDOSCOPY;  Service: Gastroenterology;  Laterality: N/A;   FLEXIBLE SIGMOIDOSCOPY N/A 09/03/2022   Procedure: FLEXIBLE SIGMOIDOSCOPY;  Surgeon: Jeani Hawking, MD;  Location: [facility] ENDOSCOPY;  Service: Gastroenterology;  Laterality: N/A;   HEMOSTASIS CLIP PLACEMENT  03/13/2019   Procedure: HEMOSTASIS CLIP PLACEMENT;  Surgeon: Beverley Fiedler, MD;  Location: [facility] ENDOSCOPY;  Service: Gastroenterology;;   HEMOSTASIS CLIP PLACEMENT  05/23/2022   Procedure: HEMOSTASIS CLIP PLACEMENT;  Surgeon: Jeani Hawking, MD;  Location: [facility] ENDOSCOPY;  Service: Gastroenterology;;  HEMOSTASIS CONTROL  05/24/2022   Procedure: HEMOSTASIS CONTROL;  Surgeon: Imogene Burn, MD;  Location: [facility] ENDOSCOPY;  Service: Gastroenterology;;   HOT HEMOSTASIS N/A 05/23/2022   Procedure: HOT HEMOSTASIS (ARGON PLASMA COAGULATION/BICAP);  Surgeon: Jeani Hawking, MD;  Location: [facility] ENDOSCOPY;  Service: Gastroenterology;  Laterality: N/A;   I & D EXTREMITY Left 05/05/2022   Procedure: IRRIGATION AND DEBRIDEMENT LEFT ARM AV FISTULA;  Surgeon: Cephus Shelling, MD;  Location: [facility] OR;  Service: Vascular;  Laterality: Left;   I & D EXTREMITY N/A 11/14/2022   Procedure: IRRIGATION AND DEBRIDEMENT OF LOWER EXTREMITY WOUND;   Surgeon: Nadara Mustard, MD;  [facility] OR;  Service: Orthopedics;  Laterality: N/A;   INSERTION OF DIALYSIS CATHETER Right 04/02/2022   Procedure: INSERTION OF TUNNELED DIALYSIS CATHETER;  Surgeon: Nada Libman, MD;  Location: [facility] OR;  Service: Vascular;  Laterality: Right;   KNEE ARTHROSCOPY Left 10/25/2006   POLYPECTOMY  03/13/2019   Procedure: POLYPECTOMY;  Surgeon: Beverley Fiedler, MD;  [facility] ENDOS[facility];  Service: Gastroenterology;;   REMOVAL OF STONES  02/03/2022   Procedure: REMOVAL OF STONES;  Surgeon: Jeani Hawking, MD;  Location: [facility] ENDOSCOPY;  Service: Gastroenterology;;   REVISON OF ARTERIOVENOUS FISTULA Left 08/20/2022   Procedure: REVISON OF LEFT ARM ARTERIOVENOUS FISTULA;  Surgeon: Nada Libman, MD;  [facility];  Service: Vascular;  Laterality: Left;   RIGHT HEART CATH N/A 07/24/2017   Procedure: RIGHT HEART CATH;  Surgeon: Dolores Patty, MD;  [facility];  Service: Cardiovascular;  Laterality: N/A;   RIGHT HEART CATHETERIZATION N/A 09/22/2013   Procedure: RIGHT HEART CATH;  Surgeon: Dolores Patty, MD;  Location: [facility] CATH LAB;  Service: Cardiovascular;  Laterality: N/A;   SHOULDER ARTHROSCOPY WITH OPEN ROTATOR CUFF REPAIR Right 03/14/2014   Procedure: RIGHT SHOULDER ARTHROSCOPY WITH BICEPS RELEASE, OPEN SUBSCAPULA REPAIR, OPEN SUPRASPINATUS REPAIR.;  Surgeon: Cammy Copa, MD;  Location: [facility] OR;  Service: Orthopedics;  Laterality: Right;   SPHINCTEROTOMY  02/03/2022   Procedure: SPHINCTEROTOMY;  Surgeon: Jeani Hawking, MD;  Location: [facility] ENDOSCOPY;  Service: Gastroenterology;;   STUMP REVISION Right 06/13/2022   Procedure: REVISION RIGHT BELOW KNEE AMPUTATION;  Surgeon: Nadara Mustard, MD;  Location: [facility] OR;  Service: Orthopedics;  Laterality: Right;   TEE WITHOUT CARDIOVERSION N/A 02/04/2022   Procedure: TRANSESOPHAGEAL ECHOCARDIOGRAM (TEE);  Surgeon: Dolores Patty, MD;  Location: [facility] ENDOSCOPY;  Service: Cardiovascular;  Laterality:  N/A;   THROMBECTOMY W/ EMBOLECTOMY Left 08/20/2022   Procedure: THROMBECTOMY OF LEFT ARM ARTERIOVENOUS FISTULA;  Surgeon: Nada Libman, MD;  Location: [facility] OR;  Service: Vascular;  Laterality: Left;   TOE AMPUTATION Right 02/24/2018   GREAT TOE AND 2ND TOE AMPUTATION   TUBAL LIGATION  1970's   UPPER EXTREMITY VENOGRAPHY N/A 07/31/2023   Procedure: UPPER EXTREMITY VENOGRAPHY;  Surgeon: Leonie Douglas, MD;  [facility];  Service: Cardiovascular;  Laterality: N/A;    Allergies: Cephalexin and Codeine  Medications: Prior to Admission medications   Medication Sig Start Date End Date Taking? Authorizing Provider  acetaminophen (TYLENOL) 325 MG tablet Take 2 tablets (650 mg total) by mouth every 6 (six) hours as needed for mild pain (or Fever >/= 101). 12/10/22  Yes Angiulli, Mcarthur Rossetti, PA-C  albuterol (VENTOLIN HFA) 108 (90 Base) MCG/ACT inhaler Inhale 1-2 puffs into the lungs every 6 (six) hours as needed for shortness of breath.   Yes [provider]  amiodarone (PACERONE) 200 MG tablet  TAKE 1 TABLET BY MOUTH EVERY DAY 07/17/23  Yes Wendall Stade, MD  apixaban (ELIQUIS) 5 MG TABS tablet Take 1 tablet (5 mg total) by mouth 2 (two) times daily. 03/27/23  Yes Gaston Islam., NP  ascorbic acid (VITAMIN C) 500 MG tablet Take 1 tablet (500 mg total) by mouth daily. 12/11/22  Yes Angiulli, Mcarthur Rossetti, PA-C  clonazePAM (KLONOPIN) 0.5 MG disintegrating tablet Take 1 tablet (0.5 mg total) by mouth 2 (two) times daily. Patient taking differently: Take 0.5 mg by mouth at bedtime. 06/30/23  Yes Donita Brooks, MD  folic acid (FOLVITE) 1 MG tablet Take 1 mg by mouth daily. 04/08/23  Yes [provider]  folic acid-vitamin b complex-vitamin c-selenium-zinc (DIALYVITE) 3 MG TABS tablet Take 1 tablet by mouth daily.   Yes [provider]  insulin aspart (NOVOLOG) 100 UNIT/ML injection Inject 0-15 Units into the skin 3 (three) times daily with meals. CBG < 70: treat  low blood sugar, CBG 70 - 120: 0 units, CBG 121 - 150: 2 units, CBG 151 - 200: 3 units, CBG 201 - 250: 5 units, CBG 251 - 300: 8 units, CBG 301 - 350: 11 units, CBG 351 - 400: 15 units, CBG > 400: call MD 03/04/23  Yes Donita Brooks, MD  insulin glargine (LANTUS) 100 UNIT/ML Solostar Pen Inject 26 Units into the skin daily. Patient taking differently: Inject 31 Units into the skin every morning. 02/02/23  Yes Zigmund Daniel., MD  levothyroxine (SYNTHROID) 50 MCG tablet Take 50 mcg by mouth daily before breakfast.   Yes [provider]  LORazepam (ATIVAN) 0.5 MG tablet TAKE 1 TABLET BY MOUTH AT BEDTIME. 08/04/23  Yes Donita Brooks, MD  melatonin 3 MG TABS tablet Take 3 mg by mouth at bedtime.   Yes [provider]  methocarbamol (ROBAXIN) 750 MG tablet TAKE 1 TABLET (750 MG TOTAL) BY MOUTH EVERY 6 (SIX) HOURS AS NEEDED FOR MUSCLE SPASMS 07/27/23  Yes Donita Brooks, MD  Methoxy PEG-Epoetin Beta (MIRCERA IJ) Mircera 02/05/23 02/04/24 Yes [provider]  midodrine (PROAMATINE) 10 MG tablet Take 1 tablet (10 mg total) by mouth daily. Patient taking differently: Take 10-20 mg by mouth Every Tuesday,Thursday,and Saturday with dialysis. 04/10/23  Yes Donita Brooks, MD  nitroGLYCERIN (NITROSTAT) 0.4 MG SL tablet Place 1 tablet (0.4 mg total) under the tongue every 5 (five) minutes as needed. 03/27/23 08/04/24 Yes Gaston Islam., NP  pantoprazole (PROTONIX) 40 MG tablet TAKE 1 TABLET (40 MG TOTAL) BY MOUTH TWICE A DAY BEFORE MEALS 07/06/23  Yes Donita Brooks, MD  polyethylene glycol (MIRALAX / GLYCOLAX) 17 g packet Take 17 g by mouth daily. Patient taking differently: Take 17 g by mouth daily as needed for mild constipation or moderate constipation. 12/10/22  Yes Angiulli, Mcarthur Rossetti, PA-C  sevelamer carbonate (RENVELA) 800 MG tablet Take 1,600 mg by mouth 3 (three) times daily with meals.   Yes [provider]  triamcinolone cream (KENALOG) 0.1 % Apply 1  Application topically 2 (two) times daily. Apply to affected areas BID Patient taking differently: Apply 1 Application topically at bedtime. 06/17/23  Yes Bernadette Hoit, MD  venlafaxine XR (EFFEXOR-XR) 75 MG 24 hr capsule TAKE 1 CAPSULE (75 MG TOTAL) BY MOUTH DAILY WITH BREAKFAST. STOP TRINTELLIX 07/06/23  Yes Donita Brooks, MD  Zinc 30 MG CAPS Take 30 mg by mouth daily.   Yes [provider]  atorvastatin (LIPITOR) 80 MG  tablet TAKE 1 TABLET BY MOUTH EVERY DAY 08/07/23   Bensimhon, Bevelyn Buckles, MD  OXYGEN Inhale 2 L into the lungs at bedtime.    [provider]  prochlorperazine (COMPAZINE) 5 MG tablet Take 1 tablet (5 mg total) by mouth every 8 (eight) hours as needed for nausea or vomiting. Patient not taking: Reported on 08/05/2023 03/03/23 07/31/23  Donita Brooks, MD     Family History  Problem Relation Age of Onset   Heart attack Mother 33    Social History   Socioeconomic History   Marital status: Married    Spouse name: Ramon Dredge   Number of children: 3   Years of education: 12th   Highest education level: Not on file  Occupational History    Employer: UNEMPLOYED  Tobacco Use   Smoking status: Former    Current packs/day: 0.00    Average packs/day: 3.0 packs/day for 32.0 years (96.0 ttl pk-yrs)    Types: Cigarettes    Start date: 10/24/1965    Quit date: 10/24/1997    Years since quitting: 25.8   Smokeless tobacco: Never  Vaping Use   Vaping status: Never Used  Substance and Sexual Activity   Alcohol use: Not Currently   Drug use: No   Sexual activity: Not Currently    Birth control/protection: Surgical    Comment: Hysterectomy  Other Topics Concern   Not on file  Social History Narrative   Pt lives at home with her spouse.Caffeine Use- 3 sodas daily.    Social Drivers of Corporate investment banker Strain: Low Risk  (08/16/2021)   Overall Financial Resource Strain (CARDIA)    Difficulty of Paying Living Expenses: Not hard at all  Food Insecurity:  No Food Insecurity (08/06/2023)   Hunger Vital Sign    Worried About Running Out of Food in the Last Year: Never true    Ran Out of Food in the Last Year: Never true  Transportation Needs: No Transportation Needs (08/06/2023)   PRAPARE - Administrator, Civil Service (Medical): No    Lack of Transportation (Non-Medical): No  Physical Activity: Inactive (08/16/2021)   Exercise Vital Sign    Days of Exercise per Week: 0 days    Minutes of Exercise per Session: 0 min  Stress: No Stress Concern Present (08/16/2021)   [employer] of Occupational Health - Occupational Stress Questionnaire    Feeling of Stress : Not at all  Social Connections: Moderately Isolated (08/06/2023)   Social Connection and Isolation Panel [NHANES]    Frequency of Communication with Friends and Family: More than three times a week    Frequency of Social Gatherings with Friends and Family: More than three times a week    Attends Religious Services: Never    Database administrator or Organizations: No    Attends Banker Meetings: Never    Marital Status: Married     Review of Systems: A 12 point ROS discussed and pertinent positives are indicated in the HPI above.  All other systems are negative.   Vital Signs: BP 106/62 (BP Location: Right Arm)   Pulse 79   Temp (!) 97.5 F (36.4 C)   Resp 18   Ht 5\' 5"  (1.651 m)   Wt 166 lb 10.7 oz (75.6 kg)   SpO2 100%   BMI 27.73 kg/m   Advance Care Plan: The advanced care place/surrogate decision maker was discussed at the time of visit and the patient did not  wish to discuss or was not able to name a surrogate decision maker or provide an advance care plan.  Physical Exam Constitutional:      General: She is not in acute distress.    Appearance: Normal appearance.  HENT:     Mouth/Throat:     Mouth: Mucous membranes are moist.  Cardiovascular:     Rate and Rhythm: Normal rate and regular rhythm.     Heart sounds: No murmur  heard. Pulmonary:     Effort: Pulmonary effort is normal.     Breath sounds: Normal breath sounds. No wheezing.  Abdominal:     General: Abdomen is flat.     Tenderness: There is abdominal tenderness in the epigastric area.  Musculoskeletal:        General: Normal range of motion.  Skin:    General: Skin is warm and dry.  Neurological:     Mental Status: She is alert and oriented to person, place, and time.  Psychiatric:        Mood and Affect: Mood normal.        Behavior: Behavior normal.        Thought Content: Thought content normal.        Judgment: Judgment normal.     Imaging: CT Angio Chest/Abd/Pel for Dissection W and/or Wo Contrast Result Date: 08/05/2023 CLINICAL DATA:  Acute aortic syndrome suspected. End-stage renal disease. EXAM: CT ANGIOGRAPHY CHEST, ABDOMEN AND PELVIS TECHNIQUE: Multidetector CT imaging through the chest, abdomen and pelvis was performed using the standard protocol during bolus administration of intravenous contrast. Multiplanar reconstructed images and MIPs were obtained and reviewed to evaluate the vascular anatomy. RADIATION DOSE REDUCTION: This exam was performed according to the departmental dose-optimization program which includes automated exposure control, adjustment of the mA and/or kV according to patient size and/or use of iterative reconstruction technique. CONTRAST:  70mL OMNIPAQUE IOHEXOL 350 MG/ML SOLN COMPARISON:  CT abdomen and pelvis 02/07/2023. CT angiogram chest abdomen and pelvis 08/27/2022. FINDINGS: CTA CHEST FINDINGS Cardiovascular: There is no evidence for aortic aneurysm or dissection. No central pulmonary embolism. The heart is enlarged. There is no pericardial effusion. There are atherosclerotic calcifications of the aorta and coronary arteries. Right-sided central venous catheter tip ends in the distal SVC. Mediastinum/Nodes: There is an enlarged precarinal lymph node measuring 14 mm, unchanged. There are calcified subcarinal and  right hilar lymph nodes. Visualized thyroid gland and esophagus are within normal limits. Lungs/Pleura: There is dependent atelectasis in the lower lobes and left upper lobe. There are trace bilateral pleural effusions. There is a calcified granuloma in the right upper lobe. Musculoskeletal: No acute fractures are seen. Review of the MIP images confirms the above findings. CTA ABDOMEN AND PELVIS FINDINGS VASCULAR Aorta: No evidence for aortic aneurysm or dissection. Calcified atherosclerotic disease is present. Celiac: Patent without evidence of aneurysm, dissection, vasculitis or significant stenosis. SMA: Patent without evidence of aneurysm, dissection, vasculitis or significant stenosis. Renals: Patent without evidence of aneurysm, dissection, vasculitis or significant stenosis. IMA: Patent without evidence of aneurysm, dissection, vasculitis or significant stenosis. Inflow: Patent without evidence of aneurysm, dissection, vasculitis or significant stenosis. Veins: No obvious venous abnormality within the limitations of this arterial phase study. Review of the MIP images confirms the above findings. NON-VASCULAR Hepatobiliary: Gallstones are present. No biliary ductal dilatation. There are calcified granulomas throughout the liver Pancreas: Unremarkable. No pancreatic ductal dilatation or surrounding inflammatory changes. Spleen: Calcified granulomas are present. Normal in size. Adrenals/Urinary Tract: Left adrenal nodule measures 14 mm.  Right adrenal gland is within normal limits. There is mild left-sided hydroureteronephrosis. There is a small amount of air in the proximal left ureter. No obstructing calculus identified. Right peripelvic cyst measures 2.3 x 3.1 cm. The bladder is decompressed. There is a small amount of air in the bladder. Stomach/Bowel: Stomach is within normal limits. Appendix appears normal. There is rectal wall thickening. No focal inflammatory stranding. Appendix is within normal limits.  Scattered colonic diverticula are present. The stomach is within normal limits. Lymphatic: No enlarged lymph nodes are seen. Reproductive: Status post hysterectomy. No adnexal masses. Other: No abdominal wall hernia or abnormality. No abdominopelvic ascites. Musculoskeletal: Degenerative changes affect the spine. Review of the MIP images confirms the above findings. IMPRESSION: 1. No evidence for aortic aneurysm or dissection. 2. Cardiomegaly. 3. Trace bilateral pleural effusions with dependent atelectasis. 4. Stable mediastinal lymphadenopathy. 5. Cholelithiasis. 6. Mild left-sided hydroureteronephrosis. No obstructing calculus identified. 7. Small amount of air in the bladder and proximal left ureter. Findings may be related to recent instrumentation, infection or emphysematous pyelitis. 8. Rectal wall thickening worrisome for proctitis. 9. Colonic diverticulosis. Aortic Atherosclerosis (ICD10-I70.0) and Emphysema (ICD10-J43.9). Electronically Signed   By: Darliss Cheney M.D.   On: 08/05/2023 20:35   PERIPHERAL VASCULAR CATHETERIZATION Result Date: 07/31/2023 DATE OF SERVICE: 07/31/2023  PATIENT:  Susan [name]  73 y.o. female  PRE-OPERATIVE DIAGNOSIS: End-stage renal disease, multiple failed dialysis access surgeries.  POST-OPERATIVE DIAGNOSIS:  Same  PROCEDURE:  Bilateral upper extremity venogram  SURGEON:  Surgeons and Role:    * Leonie Douglas, MD - Primary  ASSISTANT: none  ANESTHESIA:   none  EBL: 0  BLOOD ADMINISTERED:none  DRAINS: none  LOCAL MEDICATIONS USED:  NONE  SPECIMEN:  none  COUNTS: confirmed correct.  TOURNIQUET:  none  PATIENT DISPOSITION:  PACU - hemodynamically stable.  Delay start of Pharmacological VTE agent (>24hrs) due to surgical blood loss or risk of bleeding: no  INDICATION FOR PROCEDURE: Susan [name] is a 73 y.o. female with incisional disease, dialyzing through tunneled dialysis catheter.  Patient is undergone multiple upper extremity arteriovenous fistulas by me.  These have  thrombosed.  To see if she is a candidate for further upper extremity access, we offered her venography to ensure no central occlusion. The patient understood and wished to proceed.  OPERATIVE FINDINGS: Axillary veins patent bilaterally.  Subclavian veins patent bilaterally.  Innominate veins patent bilaterally.  Difficult to visualize superior vena cava, but it does appear patent.  There is a right IJ tunneled dialysis catheter which does not appear to be causing significant stenosis.  DESCRIPTION OF PROCEDURE: After identification of the patient in the pre-operative holding area, the patient was transferred to the Cath Lab. The patient was positioned supine on the operating room table. The bilateral arms were accessed with peripheral IVs.  Venography was performed in stations in the bilateral arms.  See above for detail.  After obtaining satisfactory radiographs, the patient was transferred off of the Cath Lab table and the IVs discontinued.   Upon completion of the case instrument and sharps counts were confirmed correct. The patient was transferred to the PACU in good condition. I was present for all portions of the procedure.  FOLLOW UP PLAN: Patient needs better blood pressure control during dialysis.  Follow-up with me in 1 month to discuss graft and to review blood pressure control.  Rande Brunt. Lenell Antu, MD [facility] Phone Number: [phone] 07/31/2023 12:28 AM  VAS [name] LOWER EXTREMITY VENOUS (DVT) Result Date: 07/21/2023  Lower Venous DVT Study Patient Name:  Susan [name]  Date of Exam:   07/20/2023 Medical Rec #: [id number]       Accession #:    [id number] Date of Birth: 1950/07/09       Patient Gender: F Patient Age:   23 years Exam Location:  [facility] Procedure:      VAS [name] LOWER EXTREMITY VENOUS (DVT) Referring Phys: MARCUS DUDA --------------------------------------------------------------------------------  Indications: Pain.  Risk Factors:  Past pregnancy. Comparison Study: None. Performing Technologist: Shona Simpson  Examination Guidelines: A complete evaluation includes B-mode imaging, spectral Doppler, color Doppler, and power Doppler as needed of all accessible portions of each vessel. Bilateral testing is considered an integral part of a complete examination. Limited examinations for reoccurring indications may be performed as noted. The reflux portion of the exam is performed with the patient in reverse Trendelenburg.  +-----+---------------+---------+-----------+----------+--------------+ RIGHTCompressibilityPhasicitySpontaneityPropertiesThrombus Aging +-----+---------------+---------+-----------+----------+--------------+ CFV  Full           Yes      Yes                                 +-----+---------------+---------+-----------+----------+--------------+   +---------+---------------+---------+-----------+----------+--------------+ LEFT     CompressibilityPhasicitySpontaneityPropertiesThrombus Aging +---------+---------------+---------+-----------+----------+--------------+ CFV      Full           Yes      Yes                                 +---------+---------------+---------+-----------+----------+--------------+ SFJ      Full                                                        +---------+---------------+---------+-----------+----------+--------------+ FV Prox  Full                                                        +---------+---------------+---------+-----------+----------+--------------+ FV Mid   Full                                                        +---------+---------------+---------+-----------+----------+--------------+ FV DistalFull                                                        +---------+---------------+---------+-----------+----------+--------------+ PFV      Full                                                         +---------+---------------+---------+-----------+----------+--------------+ POP      Full  Yes      Yes                                 +---------+---------------+---------+-----------+----------+--------------+ PTV      Full                                                        +---------+---------------+---------+-----------+----------+--------------+ PERO     Full                                                        +---------+---------------+---------+-----------+----------+--------------+ Avascular mass seen behind left knee measuring 3.48 x 2.12 x 6.50 cm.   Summary: RIGHT: - No evidence of common femoral vein obstruction.   LEFT: - There is no evidence of deep vein thrombosis in the lower extremity.  - A cystic structure is found in the popliteal fossa.  *See table(s) above for measurements and observations. Electronically signed by Lemar Livings MD on 07/21/2023 at 3:30:25 PM.    Final    VAS US DUPLEX DIALYSIS ACCESS (AVF, AVG) Result Date: 07/11/2023 DIALYSIS ACCESS Patient Name:  Susan [name]  Date of Exam:   07/10/2023 Medical Rec #: [id number]       Accession #:    [id number] Date of Birth: 14-Jun-1950       Patient Gender: F Patient Age:   71 years Exam Location:  [facility] Procedure:      VAS US DUPLEX DIALYSIS ACCESS (AVF, AVG) Referring Phys: Carolynn Sayers --------------------------------------------------------------------------------  Reason for Exam: Routine follow up. Access Site: Right Upper Extremity. Access Type: Brachiobasilic AVF created 04/27/2023. Comparison Study: No prior exam Performing Technologist: Elita Quick RVT Supporting Technologist: Thereasa Parkin RVT  Examination Guidelines: A complete evaluation includes B-mode imaging, spectral Doppler, color Doppler, and power Doppler as needed of all accessible portions of each vessel. Unilateral testing is considered an integral part of a complete examination. Limited examinations for  reoccurring indications may be performed as noted.  Findings: +--------------------+----------+-----------------+--------+ AVF                 PSV (cm/s)Flow Vol (mL/min)Comments +--------------------+----------+-----------------+--------+ Native artery inflow   103            7                 +--------------------+----------+-----------------+--------+ AVF Anastomosis         15                              +--------------------+----------+-----------------+--------+    Summary: Diminished outflow . No color flow noted at the anastamosis. No palpable thrill consistent with occlusion  *See table(s) above for measurements and observations.  Diagnosing physician: Carolynn Sayers Electronically signed by Carolynn Sayers on 07/11/2023 at 8:47:29 AM.    --------------------------------------------------------------------------------   Final     Labs:  CBC: Recent Labs    08/06/23 0350 08/07/23 0628 08/08/23 0253 08/09/23 0528  WBC 13.7* 11.3* 7.9 7.5  HGB 9.7* 9.9* 8.8* 9.2*  HCT 29.7* 30.0* 27.7* 28.5*  PLT 221  258 240 258    COAGS: No results for input(s): "INR", "APTT" in the last 8760 hours.  BMP: Recent Labs    08/06/23 0350 08/07/23 0628 08/08/23 0253 08/09/23 0528  NA 130* 132* 127* 133*  K 4.0 3.3* 3.7 3.4*  CL 91* 93* 89* 96*  CO2 22 24 23 26   GLUCOSE 130* 103* 352* 130*  BUN 41* 22 43* 21  CALCIUM 9.0 8.6* 8.3* 8.7*  CREATININE 4.63* 3.25* 4.85* 2.85*  GFRNONAA 9* 14* 9* 17*    LIVER FUNCTION TESTS: Recent Labs    02/05/23 1114 02/06/23 0726 02/14/23 1338 02/14/23 1446 02/15/23 1043 08/05/23 1440 08/07/23 0628 08/08/23 0253 08/09/23 0528  BILITOT 0.7  --  0.6 0.7  --  0.9  --   --   --   AST 21  --  22 19  --  48*  --   --   --   ALT 21  --  20 18  --  25  --   --   --   ALKPHOS 222*  --  173* 152*  --  138*  --   --   --   PROT 7.4  --  6.4* 6.2*  --  6.2*  --   --   --   ALBUMIN 3.5   < > 3.1* 2.9*   < > 2.4* 2.2* 2.0* 2.1*   < > = values in  this interval not displayed.    TUMOR MARKERS: No results for input(s): "AFPTM", "CEA", "CA199", "CHROMGRNA" in the last 8760 hours.  Assessment and Plan: Patient presented to ED on 3/12 with 24 hours of dysuria associated with abdominal and back pain, as well as intermittent chills. She was admitted for complicated ESBL E. Coli UTI wit bacteremia, treated with ceftriaxone. She has been undergoing HD without issue via tunneled HD cath (placed 1.5 years ago). Per nephrology progress note today: "Has had multiple AV grafts created in past that have thrombosed and have not been successfully used. Most recent venography 07/31/2023. Consider catheter exchange after 48 hours of abx. Not planning for line holiday at this time."  Patient will present for tentative tunneled HD catheter exchange in IR tomorrow.  Risks and benefits discussed with the patient including, but not limited to bleeding, infection, vascular injury, pneumothorax which may require chest tube placement, air embolism or even death  All of the patient's questions were answered, patient is agreeable to proceed.  Consent signed and in chart.    Thank you for allowing our service to participate in Susan [name] 's care.  Electronically Signed: Sable Feil, PA-C   08/09/2023, 2:27 PM      I spent a total of 40 Minutes in face to face in clinical consultation, greater than 50% of which was counseling/coordinating care for complicated E.coli UTI with bacteremia, and with consideration for tunneled HD catheter exchange.

## 2023-08-10 ENCOUNTER — Encounter: Payer: PPO | Admitting: Physical Therapy

## 2023-08-10 ENCOUNTER — Inpatient Hospital Stay (HOSPITAL_COMMUNITY)

## 2023-08-10 DIAGNOSIS — N390 Urinary tract infection, site not specified: Secondary | ICD-10-CM | POA: Diagnosis not present

## 2023-08-10 HISTORY — PX: IR FLUORO GUIDE CV LINE RIGHT: IMG2283

## 2023-08-10 HISTORY — PX: IR VENOCAVAGRAM IVC: IMG678

## 2023-08-10 LAB — RENAL FUNCTION PANEL
Albumin: 1.9 g/dL — ABNORMAL LOW (ref 3.5–5.0)
Anion gap: 11 (ref 5–15)
BUN: 33 mg/dL — ABNORMAL HIGH (ref 8–23)
CO2: 23 mmol/L (ref 22–32)
Calcium: 8.3 mg/dL — ABNORMAL LOW (ref 8.9–10.3)
Chloride: 93 mmol/L — ABNORMAL LOW (ref 98–111)
Creatinine, Ser: 4.52 mg/dL — ABNORMAL HIGH (ref 0.44–1.00)
GFR, Estimated: 10 mL/min — ABNORMAL LOW (ref >60)
Glucose, Bld: 356 mg/dL — ABNORMAL HIGH (ref 70–99)
Phosphorus: 3.4 mg/dL (ref 2.5–4.6)
Potassium: 3.8 mmol/L (ref 3.5–5.1)
Sodium: 127 mmol/L — ABNORMAL LOW (ref 135–145)

## 2023-08-10 LAB — GLUCOSE, CAPILLARY
Glucose-Capillary: 366 mg/dL — ABNORMAL HIGH (ref 70–99)
Glucose-Capillary: 334 mg/dL — ABNORMAL HIGH (ref 70–99)
Glucose-Capillary: 189 mg/dL — ABNORMAL HIGH (ref 70–99)
Glucose-Capillary: 174 mg/dL — ABNORMAL HIGH (ref 70–99)

## 2023-08-10 MED ORDER — HEPARIN SODIUM (PORCINE) 1000 UNIT/ML IJ SOLN
INTRAMUSCULAR | Status: AC
  Filled 2023-08-10: qty 10

## 2023-08-10 MED ORDER — POLYETHYLENE GLYCOL 3350 17 G PO PACK
17.0000 g | PACK | Freq: Two times a day (BID) | ORAL | Status: DC
  Administered 2023-08-10 – 2023-08-11 (×3): 17 g via ORAL
  Filled 2023-08-10 (×8): qty 1

## 2023-08-10 MED ORDER — LIDOCAINE HCL (PF) 1 % IJ SOLN: 5.0000 mL | INTRAMUSCULAR | Status: DC | PRN

## 2023-08-10 MED ORDER — CHLORHEXIDINE GLUCONATE CLOTH 2 % EX PADS
6.0000 | MEDICATED_PAD | Freq: Every day | CUTANEOUS | Status: DC
  Administered 2023-08-11 – 2023-08-12 (×2): 6 via TOPICAL

## 2023-08-10 MED ORDER — LIDOCAINE-EPINEPHRINE 1 %-1:100000 IJ SOLN
10.0000 mL | Freq: Once | INTRAMUSCULAR | Status: AC
Start: 2023-08-10 — End: 2023-08-10
  Administered 2023-08-10: 10 mL via INTRADERMAL

## 2023-08-10 MED ORDER — VANCOMYCIN HCL IN DEXTROSE 1-5 GM/200ML-% IV SOLN
1000.0000 mg | Freq: Once | INTRAVENOUS | Status: AC
  Administered 2023-08-10: 1000 mg via INTRAVENOUS
  Filled 2023-08-10: qty 200

## 2023-08-10 MED ORDER — VANCOMYCIN HCL IN DEXTROSE 1-5 GM/200ML-% IV SOLN
INTRAVENOUS | Status: AC
  Filled 2023-08-10: qty 200

## 2023-08-10 MED ORDER — INSULIN ASPART 100 UNIT/ML IJ SOLN
4.0000 [IU] | Freq: Three times a day (TID) | INTRAMUSCULAR | Status: DC
  Administered 2023-08-10 – 2023-08-12 (×6): 4 [IU] via SUBCUTANEOUS

## 2023-08-10 MED ORDER — INSULIN GLARGINE 100 UNIT/ML ~~LOC~~ SOLN
30.0000 [IU] | Freq: Every day | SUBCUTANEOUS | Status: DC
  Administered 2023-08-11 – 2023-08-13 (×3): 30 [IU] via SUBCUTANEOUS
  Filled 2023-08-10 (×4): qty 0.3

## 2023-08-10 MED ORDER — LIDOCAINE-EPINEPHRINE 1 %-1:100000 IJ SOLN
INTRAMUSCULAR | Status: AC
  Filled 2023-08-10: qty 1

## 2023-08-10 MED ORDER — IOHEXOL 300 MG/ML  SOLN
50.0000 mL | Freq: Once | INTRAMUSCULAR | Status: AC | PRN
  Administered 2023-08-10: 5 mL via INTRAVENOUS

## 2023-08-10 MED ORDER — INSULIN GLARGINE 100 UNIT/ML ~~LOC~~ SOLN
26.0000 [IU] | Freq: Every day | SUBCUTANEOUS | Status: DC
  Administered 2023-08-10: 26 [IU] via SUBCUTANEOUS
  Filled 2023-08-10: qty 0.26

## 2023-08-10 MED ORDER — NEPRO/CARBSTEADY PO LIQD: 237.0000 mL | Freq: Two times a day (BID) | ORAL | Status: DC

## 2023-08-10 MED ORDER — SENNOSIDES-DOCUSATE SODIUM 8.6-50 MG PO TABS
1.0000 | ORAL_TABLET | Freq: Two times a day (BID) | ORAL | Status: DC
  Administered 2023-08-10 – 2023-08-12 (×4): 1 via ORAL
  Filled 2023-08-10 (×7): qty 1

## 2023-08-10 NOTE — Inpatient Diabetes Management (Addendum)
 Inpatient Diabetes Program Recommendations  AACE/ADA: New Consensus Statement on Inpatient Glycemic Control (2015)  Target Ranges:  Prepandial:   less than 140 mg/dL      Peak postprandial:   less than 180 mg/dL (1-2 hours)      Critically ill patients:  140 - 180 mg/dL    Latest Reference Range & Units 08/09/23 08:14 08/09/23 11:47 08/09/23 16:48 08/09/23 20:13  Glucose-Capillary 70 - 99 mg/dL 161 (H)  2 units Novolog  25 units Lantus  128 (H)  2 units Novolog  107 (H) 200 (H)  (H): Data is abnormally high  Latest Reference Range & Units 08/10/23 07:59 08/10/23 12:25  Glucose-Capillary 70 - 99 mg/dL 096 (H)  15 units Novolog @1034  334 (H)  11 units Novolog  26 units Lantus   (H): Data is abnormally high     Home DM Meds: Lantus 31 units daily      Novolog 0-15 units TID   Current Orders: Lantus 26 units daily     Novolog 0-15 units TID ac/hs    MD- Note CBGs >300 today  Please consider:  1. Increase Lantus to 30 units Daily (please give an extra 4 units Lantus X 1 dose today--pt got 26 units Lantus earlier this AM)  2. Start Novolog Meal Coverage: Novolog 4 units TID with meals HOLD if pt NPO HOLD if pt eats <50% meals     --Will follow patient during hospitalization--  Ambrose Finland RN, MSN, CDCES Diabetes Coordinator Inpatient Glycemic Control Team Team Pager: [phone] (8a-5p)

## 2023-08-10 NOTE — Procedures (Signed)
  Procedure:  Exchange tunneled R chest HD CVC Palindrome 23 Preprocedure diagnosis: The primary encounter diagnosis was Lower abdominal pain. A diagnosis of ESRD on hemodialysis [facility]) was also pertinent to this visit. Postprocedure diagnosis: same EBL:    minimal Complications:   none immediate  See full dictation in [employer].  Thora Lance MD Main # [phone] Pager  [phone] Mobile [phone]

## 2023-08-10 NOTE — Progress Notes (Signed)
 [facility] Progress Note   Subjective:    Seen and examined patient at bedside. She denies SOB and CP. On 2L O2. Plan for [facility] exchange in IR today. Next HD 3/18.  Objective Vitals:   08/09/23 1647 08/09/23 2005 08/10/23 0435 08/10/23 0738  BP: (!) 125/58 122/61 114/62 (!) 98/22  Pulse: 83 87 81 82  Resp: 18 18 18    Temp:  98.2 F (36.8 C) (!) 97.4 F (36.3 C)   TempSrc:  Oral    SpO2: 100% 100% 100% 100%  Weight:      Height:       Physical Exam General: alert female in NAD Heart: RRR, no murmurs, rubs or gallops Lungs: CTA bilaterally, respirations unlabored on 2L O2 Abdomen: Soft, non-distended, +BS Extremities: R BKA; No edema LLE Dialysis Access:  [facility] with intact bandage  Filed Weights   08/05/23 1426 08/08/23 2038  Weight: 104.8 kg 75.6 kg    Intake/Output Summary (Last 24 hours) at 08/10/2023 1410 Last data filed at 08/10/2023 0435 Gross per 24 hour  Intake --  Output 0 ml  Net 0 ml    Additional Objective Labs: Basic Metabolic Panel: Recent Labs  Lab 08/08/23 0253 08/09/23 0528 08/10/23 0336  NA 127* 133* 127*  K 3.7 3.4* 3.8  CL 89* 96* 93*  CO2 23 26 23   GLUCOSE 352* 130* 356*  BUN 43* 21 33*  CREATININE 4.85* 2.85* 4.52*  CALCIUM 8.3* 8.7* 8.3*  PHOS 4.3 2.3* 3.4   Liver Function Tests: Recent Labs  Lab 08/05/23 1440 08/07/23 0628 08/08/23 0253 08/09/23 0528 08/10/23 0336  AST 48*  --   --   --   --   ALT 25  --   --   --   --   ALKPHOS 138*  --   --   --   --   BILITOT 0.9  --   --   --   --   PROT 6.2*  --   --   --   --   ALBUMIN 2.4*   < > 2.0* 2.1* 1.9*   < > = values in this interval not displayed.   Recent Labs  Lab 08/05/23 1440  LIPASE 17   CBC: Recent Labs  Lab 08/05/23 1440 08/06/23 0350 08/07/23 0628 08/08/23 0253 08/09/23 0528  WBC 16.7* 13.7* 11.3* 7.9 7.5  NEUTROABS 14.7*  --   --   --   --   HGB 9.8* 9.7* 9.9* 8.8* 9.2*  HCT 29.8* 29.7* 30.0* 27.7* 28.5*  MCV 92.0 92.0 90.4 92.3 90.8   PLT 255 221 258 240 258   Blood Culture    Component Value Date/Time   SDES BLOOD LEFT HAND 08/07/2023 1110   SPECREQUEST  08/07/2023 1110    BOTTLES DRAWN AEROBIC AND ANAEROBIC Blood Culture results may not be optimal due to an inadequate volume of blood received in culture bottles   CULT  08/07/2023 1110    NO GROWTH 3 DAYS Performed at [facility], [address] [place], [place] [address]    REPTSTATUS PENDING 08/07/2023 1110    Cardiac Enzymes: No results for input(s): "CKTOTAL", "CKMB", "CKMBINDEX", "TROPONINI" in the last 168 hours. CBG: Recent Labs  Lab 08/09/23 1147 08/09/23 1648 08/09/23 2013 08/10/23 0759 08/10/23 1225  GLUCAP 128* 107* 200* 366* 334*   Iron Studies: No results for input(s): "IRON", "TIBC", "TRANSFERRIN", "FERRITIN" in the last 72 hours. Lab Results  Component Value Date   INR  1.4 (H) 05/22/2022   INR 1.5 (H) 02/01/2022   INR 1.3 (H) 01/14/2022   Studies/Results: CT ABDOMEN PELVIS W CONTRAST Result Date: 08/09/2023 CLINICAL DATA:  Abdominal pain, dysuria, back pain, pyelonephritis suspected EXAM: CT ABDOMEN AND PELVIS WITH CONTRAST TECHNIQUE: Multidetector CT imaging of the abdomen and pelvis was performed using the standard protocol following bolus administration of intravenous contrast. RADIATION DOSE REDUCTION: This exam was performed according to the departmental dose-optimization program which includes automated exposure control, adjustment of the mA and/or kV according to patient size and/or use of iterative reconstruction technique. CONTRAST:  75mL OMNIPAQUE IOHEXOL 350 MG/ML SOLN COMPARISON:  CT 08/05/2023 FINDINGS: Lower chest: Trace bilateral pleural effusions. Bibasilar atelectasis/scarring. Hepatobiliary: No focal hepatic lesion. Cholelithiasis. No evidence of acute cholecystitis. No biliary dilation. Pancreas: Unremarkable. Spleen: Increased wedge-shaped hypoattenuating lesions in the spleen compatible with worsening infarcts  compared with 02/07/2023. Adrenals/Urinary Tract: Stable adrenal glands. No urinary calculi or hydronephrosis. Bilateral cortical renal scarring. No evidence of pyelonephritis. Gas within the nondistended thick-walled bladder. The previous gas in the proximal left ureter is no longer visualized. Stomach/Bowel: Moderate colonic stool burden. Colonic diverticulosis without diverticulitis. Stomach and appendix are within normal limits. Wall thickening about the rectum with mild adjacent stranding. Stool ball in the rectum. Vascular/Lymphatic: Aortic atherosclerosis. No enlarged abdominal or pelvic lymph nodes. Reproductive: Hysterectomy.  No adnexal mass. Other: Small volume free fluid in the pelvis. No abscess. No free intraperitoneal air. Musculoskeletal: No acute fracture. IMPRESSION: 1. Gas within the nondistended thick-walled bladder. Correlate with recent instrumentation and urinalysis to exclude cystitis. 2. Question mild proctitis. 3. 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 4. Trace bilateral pleural effusions. 5. Aortic Atherosclerosis (ICD10-I70.0). Electronically Signed   By: Minerva Fester M.D.   On: 08/09/2023 22:11    Medications:  meropenem (MERREM) IV 1 g (08/10/23 1041)    amiodarone  200 mg Oral Daily   apixaban  5 mg Oral BID   atorvastatin  80 mg Oral Daily   calcitRIOL  2.25 mcg Oral Q T,Th,Sa-HD   Chlorhexidine Gluconate Cloth  6 each Topical Q0600   Chlorhexidine Gluconate Cloth  6 each Topical Q0600   clonazePAM  0.5 mg Oral QHS   insulin aspart  0-15 Units Subcutaneous TID WC   insulin aspart  0-5 Units Subcutaneous QHS   insulin glargine  26 Units Subcutaneous Daily   levothyroxine  50 mcg Oral QAC breakfast   melatonin  3 mg Oral QHS   midodrine  10-20 mg Oral Q T,Th,Sa-HD   multivitamin  1 tablet Oral QHS   pantoprazole  40 mg Oral BID AC   polyethylene glycol  17 g Oral BID   senna-docusate  1 tablet Oral BID   sevelamer carbonate  1,600 mg Oral TID WC    venlafaxine XR  75 mg Oral Q breakfast    Dialysis Orders: [facility]. F180. 2K, 2Ca. Duration: 4h. EDW 77 kg. Access: R subclavian HD catheter. Mircera 50 mcg last given 02/22. Receives calcitriol 2.25 mcg with each session.   Assessment/Plan: # ESRD: On TTSa HD. No recent missed sessions. Initially hypokalemic on presentation but this corrected with supplementation. Renal function stable. HD 3/18 per regular schedule.    # Hyponatremia: Likely volume related, now improved. UF with HD as tolerated   # Volume/hypertension: Variable weights. Appears volume status is improving with HD. Continue to challenge EDW as tolerated. Intermittent dyspnea not improving with HD, consider CXR if it persists.    # Anemia of Chronic Kidney  Disease: Hemoglobin 9.2. Currently receiving venofer 100 mg TID with HD-holding now due to acute infection.    # Secondary Hyperparathyroidism/Hyperphosphatemia: Calcium and phos at goal. Receives calcitriol 2.25 mcg TID with HD, resumed here. Continue renvela.    # Vascular access: R tunneled subclavian catheter. Clean, dry dressing overlaying skin entry. She states this has been there for about 1.5 years. 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 Most recent venography 07/31/2023. Not planning for line holiday at this time. S/p TDC exchange in IR today.   # E. coli bacteremia and UTI: On ceftriaxone. HDS. Urine culture and blood cultures positive for e.coli, see above.   # Paroxysmal atrial fibrillation: Rate controlled. On home amiodarone, eliquis. Per primary.  Salome Holmes, NP [facility] 08/10/2023,2:10 PM  LOS: 4 days

## 2023-08-10 NOTE — Progress Notes (Signed)
Pt receives out-pt HD at [facility] on TTS 11:30 am chair time. Will assist as needed.   Olivia Canter Renal Navigator [phone]

## 2023-08-10 NOTE — Progress Notes (Signed)
 OT Cancellation Note  Patient Details Name: Susan [name] MRN: [id number] DOB: 1950/08/26   Cancelled Treatment:    Reason Eval/Treat Not Completed: Patient declined, no reason specified- pt reports too tired to participate at this time. Encouraged OOB mobility and reviewed why it is important, pt voices she understands but still continues to decline at this time.   OT will check back as able.   Barry Brunner, OT Acute Rehabilitation Services Office [phone] Secure Chat Preferred    Susan [name] 08/10/2023, 9:02 AM

## 2023-08-10 NOTE — Progress Notes (Signed)
 Susan [name]  WUJ:[id number] DOB: Apr 05, 1951 DOA: 08/05/2023 PCP: Donita Brooks, MD    Brief Narrative:  73 year old with a history of ESRD on HD TTS, PAF on Eliquis, CAD status post PCI, chronic systolic CHF with EF 30-35%, DM2, HTN, HLD, hypothyroidism, anemia of CKD, right BKA, and depression/anxiety who presented to the ER with 24 hours of dysuria associated with abdominal and back pain as well as intermittent shaking chills.  In the ER her blood pressure was 76/41 with a temperature of 98.1 and a WBC of 16.7.  UA was suggestive of UTI.  Goals of Care:   Code Status: Full Code   DVT prophylaxis: apixaban (ELIQUIS) tablet 5 mg   Interim Hx: Afebrile.  Vital signs stable.  No acute events reported overnight.  Reports that she still feels very weak in general and has poor appetite but indicates that she is feeling slightly better today.  Some abdominal and flank discomfort persist but she states it is less severe today.  Assessment & Plan:  Complicated ESBL E. coli UTI - E. 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  Hypotension Resolved with simple volume resuscitation  Hypokalemia Corrected with gentle replacement - stable with ongoing HD  Hyponatremia Sodium continues to fluctuate -improves with dialysis treatments -further investigation not felt to be warranted at present  ESRD on HD TTS Nephrology attending to dialysis needs - Nephrology has  asked IR to swap out her dialysis catheter today in setting of recent bacteremia  PAF on chronic Eliquis Heart rate controlled - continue DOAC  Chronic systolic congestive heart failure No acute exacerbation with patient actually dehydrated at presentation - volume control per dialysis  CAD Quiescent  Anemia of CKD Hemoglobin stable  DM2 CBG continues to fluctuate with a range of 125-366 over the last 24 hours -adjust treatment further today and monitor  Hypothyroidism Continue usual Synthroid dose  HLD Continue usual atorvastatin dose  Depression/anxiety Continue usual home medications  Obesity Class II - Body mass index is 27.73 kg/m.    Family Communication: No family present at time of exam today Disposition: Eventual discharge home - PT/OT    Objective: Blood pressure (!) 98/22, pulse 82, temperature (!) 97.4 F (36.3 C), resp. rate 18, height 5\' 5"  (1.651 m), weight 75.6 kg, SpO2 100%.  Intake/Output Summary (Last 24 hours) at 08/10/2023 0919 Last data filed at 08/10/2023 0435 Gross per 24 hour  Intake --  Output 0 ml  Net 0 ml   Filed Weights   08/05/23 1426 08/08/23 2038  Weight: 104.8 kg 75.6 kg    Examination: General: No acute respiratory distress Lungs: Clear to auscultation bilaterally -no wheezing Cardiovascular: Regular rate and rhythm without murmur gallop or rub  Abdomen: No appreciable tenderness to diffuse palpation, no mass, soft, BS positive Extremities: No significant cyanosis, clubbing, or edema bilateral lower extremities  CBC: Recent Labs  Lab 08/05/23 1440 08/06/23 0350 08/07/23 0628 08/08/23 0253 08/09/23 0528  WBC 16.7*   < > 11.3* 7.9 7.5  NEUTROABS 14.7*  --   --   --   --   HGB 9.8*   < > 9.9* 8.8* 9.2*  HCT 29.8*   < > 30.0* 27.7* 28.5*  MCV 92.0   < > 90.4 92.3 90.8  PLT 255   < > 258 240 258   < > = values in this interval not displayed.   Basic Metabolic Panel: Recent Labs  Lab 08/05/23 1502 08/06/23 0350  08/08/23 0253 08/09/23 0528 08/10/23 0336  NA  --    < > 127* 133* 127*  K  --    < > 3.7 3.4* 3.8  CL  --    < > 89* 96* 93*  CO2  --    < > 23 26 23   GLUCOSE  --    < > 352* 130* 356*  BUN  --    < > 43* 21 33*  CREATININE  --    < > 4.85* 2.85* 4.52*  CALCIUM  --    < > 8.3* 8.7* 8.3*  MG 2.0  --  2.1  --   --   PHOS  --    < > 4.3 2.3* 3.4   < > = values in this interval not displayed.   GFR: Estimated Creatinine Clearance: 11.1 mL/min (A) (by C-G formula based on SCr of 4.52 mg/dL (H)).   Scheduled Meds:  amiodarone  200 mg Oral Daily   apixaban  5 mg Oral BID   atorvastatin  80 mg Oral Daily   calcitRIOL  2.25 mcg Oral Q T,Th,Sa-HD   Chlorhexidine Gluconate Cloth  6 each Topical Q0600   Chlorhexidine Gluconate Cloth  6 each Topical Q0600   clonazePAM  0.5 mg Oral QHS   insulin aspart  0-15 Units Subcutaneous TID WC   insulin aspart  0-5 Units Subcutaneous QHS   insulin glargine  25 Units Subcutaneous Daily   levothyroxine  50 mcg Oral QAC breakfast   melatonin  3 mg Oral QHS   midodrine  10-20 mg Oral Q T,Th,Sa-HD   multivitamin  1 tablet Oral QHS   pantoprazole  40 mg Oral BID AC   sevelamer carbonate  1,600 mg Oral TID WC   venlafaxine XR  75 mg Oral Q breakfast   Continuous Infusions:  meropenem (MERREM) IV 1 g (08/09/23 1253)     LOS: 4 days   Lonia Blood, MD [facility] Office  [phone] Pager - Text Page per Loretha Stapler  If 7PM-7AM, please contact night-coverage per Amion 08/10/2023, 9:19 AM

## 2023-08-10 NOTE — Care Management Important Message (Signed)
 Important Message  Patient Details  Name: Susan [name] MRN: [id number] Date of Birth: February 14, 1951   Important Message Given:  Yes - Medicare IM     Dorena Bodo 08/10/2023, 2:33 PM

## 2023-08-11 DIAGNOSIS — N390 Urinary tract infection, site not specified: Secondary | ICD-10-CM | POA: Diagnosis not present

## 2023-08-11 LAB — RENAL FUNCTION PANEL
Albumin: 2 g/dL — ABNORMAL LOW (ref 3.5–5.0)
Anion gap: 11 (ref 5–15)
BUN: 47 mg/dL — ABNORMAL HIGH (ref 8–23)
CO2: 23 mmol/L (ref 22–32)
Calcium: 8.8 mg/dL — ABNORMAL LOW (ref 8.9–10.3)
Chloride: 94 mmol/L — ABNORMAL LOW (ref 98–111)
Creatinine, Ser: 5.93 mg/dL — ABNORMAL HIGH (ref 0.44–1.00)
GFR, Estimated: 7 mL/min — ABNORMAL LOW (ref >60)
Glucose, Bld: 188 mg/dL — ABNORMAL HIGH (ref 70–99)
Phosphorus: 4 mg/dL (ref 2.5–4.6)
Potassium: 3.8 mmol/L (ref 3.5–5.1)
Sodium: 128 mmol/L — ABNORMAL LOW (ref 135–145)

## 2023-08-11 LAB — GLUCOSE, CAPILLARY
Glucose-Capillary: 205 mg/dL — ABNORMAL HIGH (ref 70–99)
Glucose-Capillary: 218 mg/dL — ABNORMAL HIGH (ref 70–99)
Glucose-Capillary: 190 mg/dL — ABNORMAL HIGH (ref 70–99)

## 2023-08-11 LAB — CBC
HCT: 29.2 % — ABNORMAL LOW (ref 36.0–46.0)
Hemoglobin: 9.3 g/dL — ABNORMAL LOW (ref 12.0–15.0)
MCH: 29.2 pg (ref 26.0–34.0)
MCHC: 31.8 g/dL (ref 30.0–36.0)
MCV: 91.5 fL (ref 80.0–100.0)
Platelets: 232 10*3/uL (ref 150–400)
RBC: 3.19 MIL/uL — ABNORMAL LOW (ref 3.87–5.11)
RDW: 14.4 % (ref 11.5–15.5)
WBC: 8 10*3/uL (ref 4.0–10.5)
nRBC: 0 % (ref 0.0–0.2)

## 2023-08-11 MED ORDER — HEPARIN SODIUM (PORCINE) 1000 UNIT/ML IJ SOLN
4000.0000 [IU] | Freq: Once | INTRAMUSCULAR | Status: AC
  Administered 2023-08-11: 4000 [IU]
  Filled 2023-08-11: qty 4

## 2023-08-11 NOTE — Progress Notes (Signed)
 Susan [name]  GNF:[id number] DOB: [birth year] DOA: 08/05/2023 PCP: Donita Brooks, MD    Brief Narrative:  73 year old with a history of ESRD on HD TTS, PAF on Eliquis, CAD status post PCI, chronic systolic CHF with EF 30-35%, DM2, HTN, HLD, hypothyroidism, anemia of CKD, right BKA, and depression/anxiety who presented to the ER with 24 hours of dysuria associated with abdominal and back pain as well as intermittent shaking chills.  In the ER her blood pressure was 76/41 with a temperature of 98.1 and a WBC of 16.7.  UA was suggestive of UTI.  Goals of Care:   Code Status: Full Code   DVT prophylaxis: apixaban (ELIQUIS) tablet 5 mg   Interim Hx: No acute events recorded overnight.  Repeat blood cultures no growth x 4 days thus far.  Afebrile.  Vital signs stable.  Reports slow progress.  Some ongoing abdominal discomfort but not worse.  Appetite improving.  No shortness of breath or chest pain.    Assessment & Plan:  Complicated ESBL E. coli UTI - E. 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 - continue directed antibiotic therapy -repeat blood cultures 3/14 after initiation of therapy thus far no growth x 2 days -given persistence of abdominal symptoms CT abdomen was repeated 3/16 with no additional acute findings appreciated but noting persistent gas within the bladder but fortunately no evidence of active pyelonephritis or perinephric abscess and resolution of gas within the proximal left ureter -clinically making slow but measurable progress  Hypotension Patient has some chronic hypotension which was worsened at admission -blood pressure has now stabilized  Hypokalemia Corrected with gentle replacement - stable with ongoing HD  Hyponatremia Sodium continues to fluctuate -improves with dialysis treatments -further investigation not felt to be warranted at  present  ESRD on HD TTS Nephrology attending to dialysis needs -her dialysis cath was changed out yesterday in IR as a precaution given her bacteremia  PAF on chronic Eliquis Heart rate controlled - continue DOAC  Chronic systolic congestive heart failure No acute exacerbation with patient actually dehydrated at presentation - volume control per dialysis  CAD Quiescent  Anemia of CKD Hemoglobin stable  DM2 CBG continues to fluctuate with a range of 125-366 over the last 24 hours -CBG improving with adjustments made yesterday -follow without further change for now  Hypothyroidism Continue usual Synthroid dose  HLD Continue usual atorvastatin dose  Depression/anxiety Continue usual home medications  Obesity Class II - Body mass index is 27.73 kg/m.    Family Communication: No family present at time of exam today Disposition: Eventual discharge home - PT/OT    Objective: Blood pressure (!) 99/52, pulse 86, temperature 97.8 F (36.6 C), temperature source Oral, resp. rate 20, height 5\' 5"  (1.651 m), weight 75.6 kg, SpO2 100%.  Intake/Output Summary (Last 24 hours) at 08/11/2023 0855 Last data filed at 08/11/2023 0800 Gross per 24 hour  Intake 880 ml  Output --  Net 880 ml   Filed Weights   08/05/23 1426 08/08/23 2038  Weight: 104.8 kg 75.6 kg    Examination: General: No acute respiratory distress Lungs: Clear to auscultation bilaterally -no wheezing Cardiovascular: Regular rate and rhythm - no M Abdomen: No appreciable tenderness to diffuse palpation, no mass, soft, BS positive Extremities: No significant cyanosis, clubbing, or edema bilateral lower extremities  CBC: Recent Labs  Lab 08/05/23 1440 08/06/23 0350  08/08/23 0253 08/09/23 0528 08/11/23 0345  WBC 16.7*   < > 7.9 7.5 8.0  NEUTROABS 14.7*  --   --   --   --   HGB 9.8*   < > 8.8* 9.2* 9.3*  HCT 29.8*   < > 27.7* 28.5* 29.2*  MCV 92.0   < > 92.3 90.8 91.5  PLT 255   < > 240 258 232   < > =  values in this interval not displayed.   Basic Metabolic Panel: Recent Labs  Lab 08/05/23 1502 08/06/23 0350 08/08/23 0253 08/09/23 0528 08/10/23 0336 08/11/23 0345  NA  --    < > 127* 133* 127* 128*  K  --    < > 3.7 3.4* 3.8 3.8  CL  --    < > 89* 96* 93* 94*  CO2  --    < > 23 26 23 23   GLUCOSE  --    < > 352* 130* 356* 188*  BUN  --    < > 43* 21 33* 47*  CREATININE  --    < > 4.85* 2.85* 4.52* 5.93*  CALCIUM  --    < > 8.3* 8.7* 8.3* 8.8*  MG 2.0  --  2.1  --   --   --   PHOS  --    < > 4.3 2.3* 3.4 4.0   < > = values in this interval not displayed.   GFR: Estimated Creatinine Clearance: 8.5 mL/min (A) (by C-G formula based on SCr of 5.93 mg/dL (H)).   Scheduled Meds:  amiodarone  200 mg Oral Daily   apixaban  5 mg Oral BID   atorvastatin  80 mg Oral Daily   calcitRIOL  2.25 mcg Oral Q T,Th,Sa-HD   Chlorhexidine Gluconate Cloth  6 each Topical Q0600   clonazePAM  0.5 mg Oral QHS   feeding supplement (NEPRO CARB STEADY)  237 mL Oral BID BM   insulin aspart  0-15 Units Subcutaneous TID WC   insulin aspart  0-5 Units Subcutaneous QHS   insulin aspart  4 Units Subcutaneous TID WC   insulin glargine  30 Units Subcutaneous Daily   levothyroxine  50 mcg Oral QAC breakfast   melatonin  3 mg Oral QHS   midodrine  10-20 mg Oral Q T,Th,Sa-HD   multivitamin  1 tablet Oral QHS   pantoprazole  40 mg Oral BID AC   polyethylene glycol  17 g Oral BID   senna-docusate  1 tablet Oral BID   sevelamer carbonate  1,600 mg Oral TID WC   venlafaxine XR  75 mg Oral Q breakfast   Continuous Infusions:  meropenem (MERREM) IV 200 mL/hr at 08/10/23 1842     LOS: 5 days   Lonia Blood, MD [facility] Office  [phone] Pager - Text Page per Loretha Stapler  If 7PM-7AM, please contact night-coverage per Amion 08/11/2023, 8:55 AM

## 2023-08-11 NOTE — Progress Notes (Signed)
 [facility] Progress Note   Subjective:    Seen and examined patient at bedside. S/p TDC exchange yesterday. She reports mild burning at [facility] site which is expected given recent procedure. Plan for HD this afternoon.  Objective Vitals:   08/10/23 1639 08/10/23 2023 08/11/23 0521 08/11/23 0810  BP: (!) 126/34 (!) 93/27 (!) 92/28 (!) 99/52  Pulse: 74 81 68 86  Resp: 18 18 18 20   Temp:  98.1 F (36.7 C) 97.9 F (36.6 C) 97.8 F (36.6 C)  TempSrc:    Oral  SpO2: 100% 100% 100% 100%  Weight:      Height:       Physical Exam General: alert female in NAD Heart: RRR, no murmurs, rubs or gallops Lungs: CTA bilaterally, respirations unlabored on 2L O2 Abdomen: Soft, non-distended, +BS Extremities: R BKA; No edema LLE Dialysis Access:  [facility] with intact bandage  Filed Weights   08/05/23 1426 08/08/23 2038  Weight: 104.8 kg 75.6 kg    Intake/Output Summary (Last 24 hours) at 08/11/2023 1254 Last data filed at 08/11/2023 0800 Gross per 24 hour  Intake 880 ml  Output --  Net 880 ml    Additional Objective Labs: Basic Metabolic Panel: Recent Labs  Lab 08/09/23 0528 08/10/23 0336 08/11/23 0345  NA 133* 127* 128*  K 3.4* 3.8 3.8  CL 96* 93* 94*  CO2 26 23 23   GLUCOSE 130* 356* 188*  BUN 21 33* 47*  CREATININE 2.85* 4.52* 5.93*  CALCIUM 8.7* 8.3* 8.8*  PHOS 2.3* 3.4 4.0   Liver Function Tests: Recent Labs  Lab 08/05/23 1440 08/07/23 0628 08/09/23 0528 08/10/23 0336 08/11/23 0345  AST 48*  --   --   --   --   ALT 25  --   --   --   --   ALKPHOS 138*  --   --   --   --   BILITOT 0.9  --   --   --   --   PROT 6.2*  --   --   --   --   ALBUMIN 2.4*   < > 2.1* 1.9* 2.0*   < > = values in this interval not displayed.   Recent Labs  Lab 08/05/23 1440  LIPASE 17   CBC: Recent Labs  Lab 08/05/23 1440 08/06/23 0350 08/07/23 0628 08/08/23 0253 08/09/23 0528 08/11/23 0345  WBC 16.7* 13.7* 11.3* 7.9 7.5 8.0  NEUTROABS 14.7*  --   --   --   --    --   HGB 9.8* 9.7* 9.9* 8.8* 9.2* 9.3*  HCT 29.8* 29.7* 30.0* 27.7* 28.5* 29.2*  MCV 92.0 92.0 90.4 92.3 90.8 91.5  PLT 255 221 258 240 258 232   Blood Culture    Component Value Date/Time   SDES BLOOD LEFT HAND 08/07/2023 1110   SPECREQUEST  08/07/2023 1110    BOTTLES DRAWN AEROBIC AND ANAEROBIC Blood Culture results may not be optimal due to an inadequate volume of blood received in culture bottles   CULT  08/07/2023 1110    NO GROWTH 4 DAYS Performed at [facility], [address] [place], [place] [address]    REPTSTATUS PENDING 08/07/2023 1110    Cardiac Enzymes: No results for input(s): "CKTOTAL", "CKMB", "CKMBINDEX", "TROPONINI" in the last 168 hours. CBG: Recent Labs  Lab 08/10/23 1225 08/10/23 1640 08/10/23 2022 08/11/23 0731 08/11/23 1111  GLUCAP 334* 189* 174* 205* 218*   Iron Studies: No results for input(s): "  IRON", "TIBC", "TRANSFERRIN", "FERRITIN" in the last 72 hours. Lab Results  Component Value Date   INR 1.4 (H) 05/22/2022   INR 1.5 (H) 02/01/2022   INR 1.3 (H) 01/14/2022   Studies/Results: IR Fluoro Guide CV Line Right Result Date: 08/10/2023 CLINICAL DATA:  Long-term indwelling hemodialysis catheter, recent urosepsis, exchange requested EXAM: TUNNELED HEMODIALYSIS CATHETER REPLACEMENT WITH FLUOROSCOPIC GUIDANCE TECHNIQUE: The procedure, risks, benefits, and alternatives were explained to . Questions regarding the procedure were encouraged and answered. The patient understands and consents to the procedure. As antibiotic prophylaxis, 1 g vancomycin was ordered pre-procedure and administered intravenously within one hour of incision. The right chest hemodialysis catheter and surrounding skin entry site were prepped using maximum barrier technique including cap and mask, sterile gown, sterile gloves, large sterile sheet, and Chlorhexidine as cutaneous antisepsis. The region was infiltrated locally with 1% lidocaine. The subcutaneous cuff was dissected  free of the subcutaneous tissues. An angled stiff glidewire was advanced through the blue venous return lumen into the SVC. The catheter was partially withdrawn to allow superior vena cavagram. Minimal proximal fibrin sheath was identified, not extending into the distal SVC. The catheter was then exchanged over the guidewire for a new Palindrome 23 hemodialysis catheter, positioned with its tip in the mid right atrium. Spot chest radiograph confirms good catheter position. No pneumothorax. Catheter was flushed and primed per protocol. Catheter secured externally with O Prolene sutures. FINDINGS: Initial fluoroscopy demonstrated the tunneled catheter tips in the distal SVC. Superior vena cavagram demonstrated mild proximal fibrin sheath along the course of the catheter. The SVC is widely patent to the right atrium. New catheter placed to the cavoatrial junction. COMPLICATIONS: COMPLICATIONS None immediate FLUOROSCOPY TIME:  Radiation Exposure Index (as provided by the fluoroscopic device): 23 mGy air Kerma COMPARISON:  None Available. IMPRESSION: 1. Technically successful revision and replacement of tunneled right IJ hemodialysis catheter with fluoroscopic guidance. Ready for routine use. ACCESS: Remains approachable for percutaneous intervention as needed. Electronically Signed   By: Corlis Leak M.D.   On: 08/10/2023 16:01   IR Venocavagram Ivc Result Date: 08/10/2023 CLINICAL DATA:  Long-term indwelling hemodialysis catheter, recent urosepsis, exchange requested EXAM: TUNNELED HEMODIALYSIS CATHETER REPLACEMENT WITH FLUOROSCOPIC GUIDANCE TECHNIQUE: The procedure, risks, benefits, and alternatives were explained to . Questions regarding the procedure were encouraged and answered. The patient understands and consents to the procedure. As antibiotic prophylaxis, 1 g vancomycin was ordered pre-procedure and administered intravenously within one hour of incision. The right chest hemodialysis catheter and surrounding  skin entry site were prepped using maximum barrier technique including cap and mask, sterile gown, sterile gloves, large sterile sheet, and Chlorhexidine as cutaneous antisepsis. The region was infiltrated locally with 1% lidocaine. The subcutaneous cuff was dissected free of the subcutaneous tissues. An angled stiff glidewire was advanced through the blue venous return lumen into the SVC. The catheter was partially withdrawn to allow superior vena cavagram. Minimal proximal fibrin sheath was identified, not extending into the distal SVC. The catheter was then exchanged over the guidewire for a new Palindrome 23 hemodialysis catheter, positioned with its tip in the mid right atrium. Spot chest radiograph confirms good catheter position. No pneumothorax. Catheter was flushed and primed per protocol. Catheter secured externally with O Prolene sutures. FINDINGS: Initial fluoroscopy demonstrated the tunneled catheter tips in the distal SVC. Superior vena cavagram demonstrated mild proximal fibrin sheath along the course of the catheter. The SVC is widely patent to the right atrium. New catheter placed to the cavoatrial junction. COMPLICATIONS:  COMPLICATIONS None immediate FLUOROSCOPY TIME:  Radiation Exposure Index (as provided by the fluoroscopic device): 23 mGy air Kerma COMPARISON:  None Available. IMPRESSION: 1. Technically successful revision and replacement of tunneled right IJ hemodialysis catheter with fluoroscopic guidance. Ready for routine use. ACCESS: Remains approachable for percutaneous intervention as needed. Electronically Signed   By: Corlis Leak M.D.   On: 08/10/2023 16:01   CT ABDOMEN PELVIS W CONTRAST Result Date: 08/09/2023 CLINICAL DATA:  Abdominal pain, dysuria, back pain, pyelonephritis suspected EXAM: CT ABDOMEN AND PELVIS WITH CONTRAST TECHNIQUE: Multidetector CT imaging of the abdomen and pelvis was performed using the standard protocol following bolus administration of intravenous contrast.  RADIATION DOSE REDUCTION: This exam was performed according to the departmental dose-optimization program which includes automated exposure control, adjustment of the mA and/or kV according to patient size and/or use of iterative reconstruction technique. CONTRAST:  75mL OMNIPAQUE IOHEXOL 350 MG/ML SOLN COMPARISON:  CT 08/05/2023 FINDINGS: Lower chest: Trace bilateral pleural effusions. Bibasilar atelectasis/scarring. Hepatobiliary: No focal hepatic lesion. Cholelithiasis. No evidence of acute cholecystitis. No biliary dilation. Pancreas: Unremarkable. Spleen: Increased wedge-shaped hypoattenuating lesions in the spleen compatible with worsening infarcts compared with 02/07/2023. Adrenals/Urinary Tract: Stable adrenal glands. No urinary calculi or hydronephrosis. Bilateral cortical renal scarring. No evidence of pyelonephritis. Gas within the nondistended thick-walled bladder. The previous gas in the proximal left ureter is no longer visualized. Stomach/Bowel: Moderate colonic stool burden. Colonic diverticulosis without diverticulitis. Stomach and appendix are within normal limits. Wall thickening about the rectum with mild adjacent stranding. Stool ball in the rectum. Vascular/Lymphatic: Aortic atherosclerosis. No enlarged abdominal or pelvic lymph nodes. Reproductive: Hysterectomy.  No adnexal mass. Other: Small volume free fluid in the pelvis. No abscess. No free intraperitoneal air. Musculoskeletal: No acute fracture. IMPRESSION: 1. Gas within the nondistended thick-walled bladder. Correlate with recent instrumentation and urinalysis to exclude cystitis. 2. Question mild proctitis. 3. 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 4. Trace bilateral pleural effusions. 5. Aortic Atherosclerosis (ICD10-I70.0). Electronically Signed   By: Minerva Fester M.D.   On: 08/09/2023 22:11    Medications:  meropenem (MERREM) IV 200 mL/hr at 08/10/23 1842    amiodarone  200 mg Oral Daily   apixaban  5 mg Oral  BID   atorvastatin  80 mg Oral Daily   calcitRIOL  2.25 mcg Oral Q T,Th,Sa-HD   Chlorhexidine Gluconate Cloth  6 each Topical Q0600   clonazePAM  0.5 mg Oral QHS   feeding supplement (NEPRO CARB STEADY)  237 mL Oral BID BM   insulin aspart  0-15 Units Subcutaneous TID WC   insulin aspart  0-5 Units Subcutaneous QHS   insulin aspart  4 Units Subcutaneous TID WC   insulin glargine  30 Units Subcutaneous Daily   levothyroxine  50 mcg Oral QAC breakfast   melatonin  3 mg Oral QHS   midodrine  10-20 mg Oral Q T,Th,Sa-HD   multivitamin  1 tablet Oral QHS   pantoprazole  40 mg Oral BID AC   polyethylene glycol  17 g Oral BID   senna-docusate  1 tablet Oral BID   sevelamer carbonate  1,600 mg Oral TID WC   venlafaxine XR  75 mg Oral Q breakfast    Dialysis Orders: [facility]. F180. 2K, 2Ca. Duration: 4h. EDW 77 kg. Access: R subclavian HD catheter. Mircera 50 mcg last given 02/22. Receives calcitriol 2.25 mcg with each session.   Assessment/Plan: # ESRD: On TTSa HD. No recent missed sessions. Initially hypokalemic on presentation but this corrected  with supplementation. Renal function stable. HD today per regular schedule.    # Hyponatremia: Likely volume related, now improved. UF with HD as tolerated   # Volume/hypertension: Variable weights. Appears volume status is improving with HD. Continue to challenge EDW as tolerated. Intermittent dyspnea not improving with HD, will consider CXR if it persists. On Midodrine for hypotension.   # Anemia of Chronic Kidney Disease: Hemoglobin 9.3. Currently receiving venofer 100 mg TID with HD-holding now due to acute infection.    # Secondary Hyperparathyroidism/Hyperphosphatemia: Calcium and phos at goal. Receives calcitriol 2.25 mcg TID with HD, resumed here. Continue renvela.    # Vascular access: R tunneled subclavian catheter. Clean, dry dressing overlaying skin entry. She states this has been there for about 1.5 years. Has had multiple AV  grafts created in past that have thrombosed and have not been successfully used. Reviewed venography results from 07/31/2023: axillary, subclavian, innominate veins patent, SVC appears patent. F/u in 1 month with [facility]VS to discuss possible AVG placement. Not planning for line holiday at this time. S/p TDC exchange in IR yesterday.   # E. coli bacteremia and UTI: On meropenem. HDS. Urine culture and blood cultures positive for e.coli, see above.   # Paroxysmal atrial fibrillation: Rate controlled. On home amiodarone, eliquis. Per primary.  Salome Holmes, NP [facility] 08/11/2023,12:54 PM  LOS: 5 days

## 2023-08-11 NOTE — TOC Progression Note (Signed)
 Transition of Care [facility]) - Progression Note    Patient Details  Name: Susan [name] MRN: [id number] Date of Birth: 1950-06-19  [facility] CM/SW Contact  Tom-Johnson, Hershal Coria, RN Phone Number: 08/11/2023, 11:16 AM  Clinical Narrative:     CM spoke with patient at bedside about outpatient PT at discharge, recommended by PT. Patient states she active with outpatient PT at [facility] on [place]. Requesting to return at discharge. Resumption of care order placed, info on AVS.   CM will continue to follow as patient progresses with care towards discharge.           Expected Discharge Plan and Services                                               Social Determinants of Health (SDOH) Interventions SDOH Screenings   Food Insecurity: No Food Insecurity (08/06/2023)  Housing: Low Risk  (08/06/2023)  Transportation Needs: No Transportation Needs (08/06/2023)  Utilities: Not At Risk (08/06/2023)  Alcohol Screen: Low Risk  (08/16/2021)  Depression (PHQ2-9): Low Risk  (09/29/2022)  Financial Resource Strain: Low Risk  (08/16/2021)  Physical Activity: Inactive (08/16/2021)  Social Connections: Moderately Isolated (08/06/2023)  Stress: No Stress Concern Present (08/16/2021)  Tobacco Use: Medium Risk (08/06/2023)    Readmission Risk Interventions    08/06/2023    4:00 PM 01/21/2023    3:09 PM 09/04/2022    5:11 PM  Readmission Risk Prevention Plan  Transportation Screening Complete Complete Complete  Medication Review (RN Care Manager) Referral to Pharmacy Referral to Pharmacy Complete  PCP or Specialist appointment within 3-5 days of discharge Complete Complete Complete  HRI or Home Care Consult Complete Complete Complete  SW Recovery Care/Counseling Consult Complete Complete Complete  Palliative Care Screening Not Applicable Not Applicable Not Applicable  Skilled Nursing Facility Not Applicable Not Applicable Complete

## 2023-08-11 NOTE — Progress Notes (Signed)
   08/11/23 1845  Vitals  Temp 98.2 F (36.8 C)  Pulse Rate 85  Resp 18  BP 102/76  SpO2 100 %  O2 Device Nasal Cannula  Weight 80.6 kg  Type of Weight Post-Dialysis  Oxygen Therapy  O2 Flow Rate (L/min) 2 L/min  Patient Activity (if Appropriate) In bed  Pulse Oximetry Type Continuous  Oximetry Probe Site Changed No  Post Treatment  Dialyzer Clearance Lightly streaked  Liters Processed 84  Fluid Removed (mL) 1000 mL  Tolerated HD Treatment Yes   Received patient in bed to unit.  Alert and oriented.  Informed consent signed and in chart.   TX duration: 3 hours and 30 minutes  Patient tolerated well.  Transported back to the room  Alert, without acute distress.  Hand-off given to patient's nurse.   Access used: Right chest catheter

## 2023-08-11 NOTE — Progress Notes (Signed)
 Physical Therapy Treatment Patient Details Name: Susan [name] MRN: [id number] DOB: [birth year] Today's Date: 08/11/2023   History of Present Illness Pt is a 73 y/o female presenting on 3/13 with abdominal pain, dysuria, and back pain. UA consistent with UTI, E. coli bacteremia. PMH includes: ESRD on TTS HD, PAF on eliquis, CAD s/p PCI, chronic HFrER, DM2, HTN, anemia of chronic disease, R BKA.    PT Comments  Patient participating in activity at EOB today.  Admitted she had refused PT last time I came since she had so much difficulty getting back to bed after up to chair.  Reports for HD this pm so planned back in bed end of session.  She performed sit to stand x 2 progressive less assistance though needing bed height elevation.  She also needed help for donning the leg and for hygiene as incontinent of BM.  Still with some back pain, though much improved.  Patient eager for progress and return home and back to outpatient PT.   If plan is discharge home, recommend the following: A little help with walking and/or transfers;A little help with bathing/dressing/bathroom;Direct supervision/assist for medications management;Direct supervision/assist for financial management;Assistance with cooking/housework;Help with stairs or ramp for entrance;Assist for transportation   Can travel by private vehicle        Equipment Recommendations  None recommended by PT    Recommendations for Other Services       Precautions / Restrictions Precautions Precautions: Fall Recall of Precautions/Restrictions: Intact Required Braces or Orthoses: Other Brace Other Brace: R LE prosthetic     Mobility  Bed Mobility Overal bed mobility: Needs Assistance     Sidelying to sit: Used rails, Min assist, HOB elevated   Sit to supine: Mod assist, Used rails   General bed mobility comments: increased time, assist with HHA to pull up to sit, to supine assist for legs into bed.    Transfers Overall transfer  level: Needs assistance Equipment used: Rolling walker (2 wheels) Transfers: Sit to/from Stand Sit to Stand: From elevated surface, Mod assist   Step pivot transfers: Mod assist       General transfer comment: some lifting help from higher surface of bed with cues for hand placement and after assist to don prosthetic on EOB; took side steps to [facility] prior to returning to sit for positioning in bed    Ambulation/Gait                   Stairs             Wheelchair Mobility     Tilt Bed    Modified Rankin (Stroke Patients Only)       Balance Overall balance assessment: Needs assistance   Sitting balance-Leahy Scale: Fair Sitting balance - Comments: balances with S while PT assisting to don prosthetic     Standing balance-Leahy Scale: Poor Standing balance comment: UE support on walker And min a For balance, performed lateral weight shifts with CGA and cues holding RW                            Communication Communication Communication: No apparent difficulties  Cognition Arousal: Alert Behavior During Therapy: WFL for tasks assessed/performed   PT - Cognitive impairments: No apparent impairments                         Following commands: Intact  Cueing Cueing Techniques: Verbal cues  Exercises Other Exercises Other Exercises: standing partial squats x 5, hip flexion and seated heel slides on EOB    General Comments General comments (skin integrity, edema, etc.): assist to don tight comprassion on L LE and shoe, and in sitting for liner, sock ply and prosthetic; pt able to take the leg off and liner/ etc with min A; assisted hygiene in standing due to pt incontinent of BM.      Pertinent Vitals/Pain Pain Assessment Pain Assessment: Faces Faces Pain Scale: Hurts even more Pain Location: back with fatigue while at EOB Pain Descriptors / Indicators: Aching Pain Intervention(s): Monitored during session, Repositioned     Home Living                          Prior Function            PT Goals (current goals can now be found in the care plan section) Progress towards PT goals: Progressing toward goals    Frequency    Min 1X/week      PT Plan      Co-evaluation              AM-PAC PT "6 Clicks" Mobility   Outcome Measure  Help needed turning from your back to your side while in a flat bed without using bedrails?: A Little Help needed moving from lying on your back to sitting on the side of a flat bed without using bedrails?: A Little Help needed moving to and from a bed to a chair (including a wheelchair)?: A Little Help needed standing up from a chair using your arms (e.g., wheelchair or bedside chair)?: A Lot Help needed to walk in hospital room?: A Lot Help needed climbing 3-5 steps with a railing? : Total 6 Click Score: 14    End of Session Equipment Utilized During Treatment: Gait belt;Other (comment) (R LE prosthesis) Activity Tolerance: Patient tolerated treatment well Patient left: in bed;with call bell/phone within reach;with bed alarm set Nurse Communication: Mobility status PT Visit Diagnosis: Unsteadiness on feet (R26.81);Muscle weakness (generalized) (M62.81);Difficulty in walking, not elsewhere classified (R26.2);Pain Pain - Right/Left: Left Pain - part of body: Knee     Time: 1191-4782 PT Time Calculation (min) (ACUTE ONLY): 32 min  Charges:    $Therapeutic Exercise: 8-22 mins $Therapeutic Activity: 8-22 mins PT General Charges $$ ACUTE PT VISIT: 1 Visit                     Sheran Lawless, PT Acute Rehabilitation Services Office:[phone] 08/11/2023    Elray Mcgregor 08/11/2023, 3:13 PM

## 2023-08-12 ENCOUNTER — Encounter: Payer: PPO | Admitting: Physical Therapy

## 2023-08-12 DIAGNOSIS — N390 Urinary tract infection, site not specified: Secondary | ICD-10-CM | POA: Diagnosis not present

## 2023-08-12 LAB — GLUCOSE, CAPILLARY
Glucose-Capillary: 167 mg/dL — ABNORMAL HIGH (ref 70–99)
Glucose-Capillary: 138 mg/dL — ABNORMAL HIGH (ref 70–99)
Glucose-Capillary: 165 mg/dL — ABNORMAL HIGH (ref 70–99)
Glucose-Capillary: 153 mg/dL — ABNORMAL HIGH (ref 70–99)

## 2023-08-12 LAB — CULTURE, BLOOD (ROUTINE X 2)
Culture: NO GROWTH
Culture: NO GROWTH

## 2023-08-12 LAB — RENAL FUNCTION PANEL
Albumin: 2 g/dL — ABNORMAL LOW (ref 3.5–5.0)
Anion gap: 9 (ref 5–15)
BUN: 27 mg/dL — ABNORMAL HIGH (ref 8–23)
CO2: 28 mmol/L (ref 22–32)
Calcium: 8.6 mg/dL — ABNORMAL LOW (ref 8.9–10.3)
Chloride: 93 mmol/L — ABNORMAL LOW (ref 98–111)
Creatinine, Ser: 4.01 mg/dL — ABNORMAL HIGH (ref 0.44–1.00)
GFR, Estimated: 11 mL/min — ABNORMAL LOW (ref >60)
Glucose, Bld: 222 mg/dL — ABNORMAL HIGH (ref 70–99)
Phosphorus: 2.2 mg/dL — ABNORMAL LOW (ref 2.5–4.6)
Potassium: 3.4 mmol/L — ABNORMAL LOW (ref 3.5–5.1)
Sodium: 130 mmol/L — ABNORMAL LOW (ref 135–145)

## 2023-08-12 MED ORDER — OXYCODONE-ACETAMINOPHEN 5-325 MG PO TABS
1.0000 | ORAL_TABLET | Freq: Four times a day (QID) | ORAL | Status: DC | PRN
  Administered 2023-08-12 – 2023-08-13 (×2): 1 via ORAL
  Filled 2023-08-12 (×2): qty 1

## 2023-08-12 MED ORDER — FLUCONAZOLE 150 MG PO TABS
150.0000 mg | ORAL_TABLET | Freq: Once | ORAL | Status: AC
  Administered 2023-08-12: 150 mg via ORAL
  Filled 2023-08-12: qty 1

## 2023-08-12 MED ORDER — CHLORHEXIDINE GLUCONATE CLOTH 2 % EX PADS: 6.0000 | MEDICATED_PAD | Freq: Every day | CUTANEOUS | Status: DC

## 2023-08-12 NOTE — Progress Notes (Signed)
 [facility] night cross cover note:   I was notified by RN that the patient is complaining of low back pain, which was refractory to existing order for prn acetaminophen.  I subsequently added Percocet 5/325 mg p.o. every 6 hours as needed for pain.  Additionally, the patient is complaining of vaginal itching and burning.  I subsequently ordered fluconazole 150 mg p.o. x 1 dose for potential vaginal candidiasis.    Newton Pigg, DO Hospitalist

## 2023-08-12 NOTE — Progress Notes (Signed)
 PROGRESS NOTE  Susan [name]  DOB: 1951-02-02  PCP: Donita Brooks, MD NAT:[id number]  DOA: 08/05/2023  LOS: 6 days  Hospital Day: 8  Brief narrative: Susan [name] is a 73 y.o. female with PMH significant for ESRD-HD-TTS, PAF on Eliquis, CAD status post PCI, chronic systolic CHF with EF 30-35%, DM2, HTN, HLD, hypothyroidism, anemia of CKD, right BKA, and depression/anxiety  3/12, patient  presented to the ER with 24 hours of dysuria associated with abdominal and back pain as well as intermittent shaking chills.  In the ER her blood pressure was low at 76/41 with a temperature of 98.1 WBC of 16.7.  UA was suggestive of UTI.  CT abd/pelvis at presentation noted a small amount of air in the bladder in the proximal left ureter which could be reflective of emphysematous pyelitis Admitted to [facility] with UTI Urine culture and blood culture grew ESBL E. coli  Subjective: Patient was seen and examined this morning.  Pleasant elderly Caucasian female.  Propped up in bed.  On low-flow oxygen. Not in distress.   Assessment and plan: Complicated ESBL E. coli UTI E. 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 Admitted for UTI.   Started on broad-spectrum IV antibiotics Urine culture and blood culture grew ESBL E. Coli Repeat culture on 3/14 did not show any growth Given persistence of abdominal symptoms, CT abdomen was repeated on 3/16 which did not show any additional acute findings.  It showed persistent gas within the bladder but fortunately no evidence of active pyelonephritis or perinephric abscess and resolution of gas within the proximal left ureter. Clinically making slow but measurable progress Recent Labs  Lab 08/05/23 1512 08/05/23 1629 08/06/23 0350 08/07/23 0628 08/08/23 0253 08/09/23 0528 08/11/23 0345  WBC  --   --  13.7* 11.3* 7.9 7.5 8.0  LATICACIDVEN 2.3* 1.1  --   --   --   --   --     ESRD-HD-TTS Nephrology involved. 3/17, right subclavian dialysis catheter was changed out by IR because of bacteremia  Hypokalemia/hypophosphatemia Related to dialysis.  Replacement given Recent Labs  Lab 08/05/23 1502 08/06/23 0350 08/08/23 0253 08/09/23 0528 08/10/23 0336 08/11/23 0345 08/12/23 0345  K  --    < > 3.7 3.4* 3.8 3.8 3.4*  MG 2.0  --  2.1  --   --   --   --   PHOS  --    < > 4.3 2.3* 3.4 4.0 2.2*   < > = values in this interval not displayed.   Chronic hyponatremia Likely due to dialysis status.  Low but stable sodium level Recent Labs  Lab 08/05/23 1440 08/06/23 0350 08/07/23 0628 08/08/23 0253 08/09/23 0528 08/10/23 0336 08/11/23 0345 08/12/23 0345  NA 129* 130* 132* 127* 133* 127* 128* 130*   Chronic hypotension Chronic systolic CHF Patient has some chronic hypotension which was worsened at admission -blood pressure has now stabilized Chronically on midodrine with dialysis Most recent echo from April 2024 with EF 30 to 35%, moderate to severe LV dysfunction  PAF  Heart rate controlled with amiodarone Chronically anticoagulated with Eliquis    CAD, HLD Eliquis and statin  Type 2 diabetes mellitus uncontrolled with hyperglycemia A1c 9 on 02/27/2023 PTA meds-Lantus 30 units daily, Currently continued on Lantus 30 units daily and SSI/Accu-Cheks Recent Labs  Lab 08/11/23 0731 08/11/23 1111 08/11/23 2125 08/12/23 0742 08/12/23 1144  GLUCAP 205* 218* 190* 167* 138*  Anemia of CKD Hemoglobin stable between 9 and 10. Recent Labs    08/06/23 0350 08/07/23 0628 08/08/23 0253 08/09/23 0528 08/11/23 0345  HGB 9.7* 9.9* 8.8* 9.2* 9.3*  MCV 92.0 90.4 92.3 90.8 91.5  FERRITIN  --  2,289*  --   --   --   TIBC  --  175*  --   --   --   IRON  --  24*  --   --   --    Hypothyroidism Continue Synthroid   Depression/anxiety Continue Klonopin nightly, Ativan nightly, melatonin nightly, Effexor daily   Mobility: Working with PT  Goals  of care   Code Status: Full Code     DVT prophylaxis:   apixaban (ELIQUIS) tablet 5 mg   Antimicrobials: IV meropenem Fluid: None Consultants: Nephrology Family Communication: None at bedside  Status: Patient Level of care:  Med-Surg   Patient is from: Home Needs to continue in-hospital care: On IV meropenem Anticipated d/c to: Home with outpatient PT eventually      Diet:  Diet Order             Diet renal/carb modified with fluid restriction Diet-HS Snack? Nothing; Fluid restriction: 1200 mL Fluid; Room service appropriate? Yes; Fluid consistency: Thin  Diet effective now                   Scheduled Meds:  amiodarone  200 mg Oral Daily   apixaban  5 mg Oral BID   atorvastatin  80 mg Oral Daily   calcitRIOL  2.25 mcg Oral Q T,Th,Sa-HD   Chlorhexidine Gluconate Cloth  6 each Topical Q0600   clonazePAM  0.5 mg Oral QHS   feeding supplement (NEPRO CARB STEADY)  237 mL Oral BID BM   insulin aspart  0-15 Units Subcutaneous TID WC   insulin aspart  0-5 Units Subcutaneous QHS   insulin aspart  4 Units Subcutaneous TID WC   insulin glargine  30 Units Subcutaneous Daily   levothyroxine  50 mcg Oral QAC breakfast   melatonin  3 mg Oral QHS   midodrine  10-20 mg Oral Q T,Th,Sa-HD   multivitamin  1 tablet Oral QHS   pantoprazole  40 mg Oral BID AC   polyethylene glycol  17 g Oral BID   senna-docusate  1 tablet Oral BID   sevelamer carbonate  1,600 mg Oral TID WC   venlafaxine XR  75 mg Oral Q breakfast    PRN meds: acetaminophen **OR** [DISCONTINUED] acetaminophen, albuterol, bisacodyl, prochlorperazine   Infusions:   meropenem (MERREM) IV 1 g (08/12/23 1214)    Antimicrobials: Anti-infectives (From admission, onward)    Start     Dose/Rate Route Frequency Ordered Stop   08/10/23 1130  vancomycin (VANCOCIN) IVPB 1000 mg/200 mL premix        1,000 mg 200 mL/hr over 60 Minutes Intravenous  Once 08/10/23 1043 08/10/23 1221   08/07/23 1200  meropenem  (MERREM) 1 g in sodium chloride 0.9 % 100 mL IVPB        1 g 200 mL/hr over 30 Minutes Intravenous Every 24 hours 08/07/23 0843     08/06/23 2200  cefTRIAXone (ROCEPHIN) 1 g in sodium chloride 0.9 % 100 mL IVPB  Status:  Discontinued        1 g 200 mL/hr over 30 Minutes Intravenous Every 24 hours 08/05/23 2308 08/06/23 0920   08/06/23 1030  cefTRIAXone (ROCEPHIN) 2 g in sodium chloride 0.9 % 100 mL IVPB  Status:  Discontinued        2 g 200 mL/hr over 30 Minutes Intravenous Every 24 hours 08/06/23 0920 08/07/23 0843   08/05/23 2200  cefTRIAXone (ROCEPHIN) 1 g in sodium chloride 0.9 % 100 mL IVPB        1 g 200 mL/hr over 30 Minutes Intravenous  Once 08/05/23 2145 08/05/23 2310       Objective: Vitals:   08/12/23 0453 08/12/23 0925  BP: (!) 106/52 (!) 105/49  Pulse: 84 77  Resp: 18   Temp: 98.3 F (36.8 C)   SpO2: 100% 100%    Intake/Output Summary (Last 24 hours) at 08/12/2023 1305 Last data filed at 08/12/2023 0934 Gross per 24 hour  Intake 460 ml  Output 1200 ml  Net -740 ml   Filed Weights   08/11/23 1434 08/11/23 1845 08/12/23 0318  Weight: 81.9 kg 80.6 kg 80.6 kg   Weight change:  Body mass index is 29.57 kg/m.   Physical Exam: General exam: Pleasant, elderly Caucasian female. Skin: No rashes, lesions or ulcers. HEENT: Atraumatic, normocephalic, no obvious bleeding Lungs: Clear to auscultation bilaterally,  CVS: S1, S2, no murmur,   GI/Abd: Soft, nontender, nondistended, bowel sound present, mild left CVA tenderness present CNS: Alert, awake, oriented x 3 Psychiatry: Mood appropriate,  Extremities: No pedal edema, no calf tenderness,   Data Review: I have personally reviewed the laboratory data and studies available.  F/u labs  Unresulted Labs (From admission, onward)     Start     Ordered   08/13/23 0500  Basic metabolic panel  Tomorrow morning,   R       Question:  Specimen collection method  Answer:  Lab=Lab collect   08/12/23 1304   08/13/23 0500   CBC with Differential/Platelet  Tomorrow morning,   R       Question:  Specimen collection method  Answer:  Lab=Lab collect   08/12/23 1304   08/07/23 0500  Renal function panel  Daily,   R      08/06/23 1139   Pending  MRSA Next Gen by PCR, Nasal  Once,   R        Pending            Total time spent in review of labs and imaging, patient evaluation, formulation of plan, documentation and communication with family: 55 minutes  Signed, Lorin Glass, MD [facility] 08/12/2023

## 2023-08-12 NOTE — Progress Notes (Addendum)
 [facility] Progress Note   Subjective:    Seen and examined patient at bedside. Tolerated yesterday's HD with net UF 1L. Reached out to Hospitalist on discharge plan. Next HD 3/20 if she remains inpatient.  Objective Vitals:   08/11/23 2125 08/12/23 0318 08/12/23 0453 08/12/23 0925  BP: (!) 118/56  (!) 106/52 (!) 105/49  Pulse: 90  84 77  Resp: 18  18   Temp: 98.1 F (36.7 C)  98.3 F (36.8 C)   TempSrc:      SpO2: 100%  100% 100%  Weight:  80.6 kg    Height:       Physical Exam General: alert female in NAD Heart: RRR, no murmurs, rubs or gallops Lungs: CTA bilaterally, respirations unlabored on 2L O2 Abdomen: Soft, non-distended, +BS Extremities: R BKA; No edema LLE Dialysis Access:  [facility] with intact bandage  Filed Weights   08/11/23 1434 08/11/23 1845 08/12/23 0318  Weight: 81.9 kg 80.6 kg 80.6 kg    Intake/Output Summary (Last 24 hours) at 08/12/2023 1217 Last data filed at 08/12/2023 0934 Gross per 24 hour  Intake 700 ml  Output 1200 ml  Net -500 ml    Additional Objective Labs: Basic Metabolic Panel: Recent Labs  Lab 08/10/23 0336 08/11/23 0345 08/12/23 0345  NA 127* 128* 130*  K 3.8 3.8 3.4*  CL 93* 94* 93*  CO2 23 23 28   GLUCOSE 356* 188* 222*  BUN 33* 47* 27*  CREATININE 4.52* 5.93* 4.01*  CALCIUM 8.3* 8.8* 8.6*  PHOS 3.4 4.0 2.2*   Liver Function Tests: Recent Labs  Lab 08/05/23 1440 08/07/23 0628 08/10/23 0336 08/11/23 0345 08/12/23 0345  AST 48*  --   --   --   --   ALT 25  --   --   --   --   ALKPHOS 138*  --   --   --   --   BILITOT 0.9  --   --   --   --   PROT 6.2*  --   --   --   --   ALBUMIN 2.4*   < > 1.9* 2.0* 2.0*   < > = values in this interval not displayed.   Recent Labs  Lab 08/05/23 1440  LIPASE 17   CBC: Recent Labs  Lab 08/05/23 1440 08/06/23 0350 08/07/23 0628 08/08/23 0253 08/09/23 0528 08/11/23 0345  WBC 16.7* 13.7* 11.3* 7.9 7.5 8.0  NEUTROABS 14.7*  --   --   --   --   --   HGB  9.8* 9.7* 9.9* 8.8* 9.2* 9.3*  HCT 29.8* 29.7* 30.0* 27.7* 28.5* 29.2*  MCV 92.0 92.0 90.4 92.3 90.8 91.5  PLT 255 221 258 240 258 232   Blood Culture    Component Value Date/Time   SDES BLOOD LEFT HAND 08/07/2023 1110   SPECREQUEST  08/07/2023 1110    BOTTLES DRAWN AEROBIC AND ANAEROBIC Blood Culture results may not be optimal due to an inadequate volume of blood received in culture bottles   CULT  08/07/2023 1110    NO GROWTH 5 DAYS Performed at [facility], [address], [place], [place] [address]    REPTSTATUS 08/12/2023 FINAL 08/07/2023 1110    Cardiac Enzymes: No results for input(s): "CKTOTAL", "CKMB", "CKMBINDEX", "TROPONINI" in the last 168 hours. CBG: Recent Labs  Lab 08/11/23 0731 08/11/23 1111 08/11/23 2125 08/12/23 0742 08/12/23 1144  GLUCAP 205* 218* 190* 167* 138*   Iron Studies: No results  for input(s): "IRON", "TIBC", "TRANSFERRIN", "FERRITIN" in the last 72 hours. Lab Results  Component Value Date   INR 1.4 (H) 05/22/2022   INR 1.5 (H) 02/01/2022   INR 1.3 (H) 01/14/2022   Studies/Results: No results found.  Medications:  meropenem (MERREM) IV 1 g (08/12/23 1214)    amiodarone  200 mg Oral Daily   apixaban  5 mg Oral BID   atorvastatin  80 mg Oral Daily   calcitRIOL  2.25 mcg Oral Q T,Th,Sa-HD   Chlorhexidine Gluconate Cloth  6 each Topical Q0600   clonazePAM  0.5 mg Oral QHS   feeding supplement (NEPRO CARB STEADY)  237 mL Oral BID BM   insulin aspart  0-15 Units Subcutaneous TID WC   insulin aspart  0-5 Units Subcutaneous QHS   insulin aspart  4 Units Subcutaneous TID WC   insulin glargine  30 Units Subcutaneous Daily   levothyroxine  50 mcg Oral QAC breakfast   melatonin  3 mg Oral QHS   midodrine  10-20 mg Oral Q T,Th,Sa-HD   multivitamin  1 tablet Oral QHS   pantoprazole  40 mg Oral BID AC   polyethylene glycol  17 g Oral BID   senna-docusate  1 tablet Oral BID   sevelamer carbonate  1,600 mg Oral TID WC   venlafaxine XR   75 mg Oral Q breakfast    Dialysis Orders:  TTSa. F180. 2K, 2Ca. Duration: 4h. EDW 77 kg. Access: R subclavian HD catheter. Mircera 50 mcg last given 02/22. Receives calcitriol 2.25 mcg with each session.   Assessment/Plan: # ESRD: On TTSa HD. No recent missed sessions. Initially hypokalemic on presentation but this corrected with supplementation. Renal function stable. Next HD 3/20 per regular schedule if she remains inpatient.   # Hyponatremia: Likely volume related, now improved. UF with HD as tolerated. Na now 130   # Volume/hypertension: Variable weights. Appears volume status is improving with HD. Continue to challenge EDW as tolerated. Intermittent dyspnea not improving with HD, will repeat another CXR in AM. On Midodrine for hypotension.   # Anemia of Chronic Kidney Disease: Hemoglobin 9.3. Currently receiving venofer 100 mg TID with HD-holding now due to acute infection.    # Secondary Hyperparathyroidism/Hyperphosphatemia: Calcium and phos at goal. Receives calcitriol 2.25 mcg TID with HD, resumed here. Continue renvela.    # Vascular access: R tunneled subclavian catheter. Clean, dry dressing overlaying skin entry. She states this has been there for about 1.5 years. 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 Reviewed venography results from 07/31/2023: axillary, subclavian, innominate veins patent, SVC appears patent. F/u in 1 month with [facility]VS to discuss possible AVG placement. Not planning for line holiday at this time. S/p TDC exchange in IR 3/17.   # E. coli bacteremia and UTI: On meropenem. HDS. Urine culture and blood cultures positive for e.coli, see above.   # Paroxysmal atrial fibrillation: Rate controlled. On home amiodarone, eliquis. Per primary.  #Dispo: Reached out to Hospitalist on discharge plan. Seems okay for discharge from a renal standpoint.   Susan Holmes, NP [facility] 08/12/2023,12:17 PM  LOS: 6 days

## 2023-08-12 NOTE — Progress Notes (Signed)
 Physical Therapy Treatment Patient Details Name: Susan [name] MRN: [id number] DOB: 1951-01-27 Today's Date: 08/12/2023   History of Present Illness Pt is a 73 y/o female presenting on 3/13 with abdominal pain, dysuria, and back pain. UA consistent with UTI, E. coli bacteremia. PMH includes: ESRD on TTS HD, PAF on eliquis, CAD s/p PCI, chronic HFrER, DM2, HTN, anemia of chronic disease, R BKA.    PT Comments  Patient progressing with mobility and able to take more steps to [facility] and to recliner though felt more confident when having +2 for safety.  Patient also had incontinence of bowel, though no diarrhea.  Possible from antibiotics as reports when 3 times yesterday.  Feel she will continue to benefit from skilled PT in the acute setting.  Spouse present today and assisting for donning shoes/prosthetic prior to session and for holding chair prior to stepping to [facility].  Continue to feel she can return home with spouse assist.  PT will continue.     If plan is discharge home, recommend the following: A little help with walking and/or transfers;A little help with bathing/dressing/bathroom;Direct supervision/assist for medications management;Direct supervision/assist for financial management;Assistance with cooking/housework;Help with stairs or ramp for entrance;Assist for transportation   Can travel by private vehicle        Equipment Recommendations  None recommended by PT    Recommendations for Other Services       Precautions / Restrictions Precautions Precautions: Fall Recall of Precautions/Restrictions: Intact Other Brace: R LE prosthetic     Mobility  Bed Mobility Overal bed mobility: Needs Assistance     Sidelying to sit: Used rails, Min assist, HOB elevated       General bed mobility comments: up to EOB with OT A for trunk, cues for scooting to EOB    Transfers Overall transfer level: Needs assistance Equipment used: Rolling walker (2 wheels) Transfers: Sit to/from  Stand Sit to Stand: From elevated surface, Mod assist, +2 safety/equipment   Step pivot transfers: +2 safety/equipment, Mod assist       General transfer comment: up from elevated bed to stand with questioning cues for hand placement, assist for balance as pt leaning over and incontinent of stool along the way to [facility]; cues for stepping and backing to chair, reaching for armrests; Stepping from [facility] to recliner with increased distance and mod A for balance, walker management and cues for stepping.    Ambulation/Gait                   Stairs             Wheelchair Mobility     Tilt Bed    Modified Rankin (Stroke Patients Only)       Balance Overall balance assessment: Needs assistance Sitting-balance support: No upper extremity supported, Feet unsupported Sitting balance-Leahy Scale: Fair     Standing balance support: Bilateral upper extremity supported Standing balance-Leahy Scale: Poor Standing balance comment: assist for balance standing while OT assisting with hygiene, pt holding RW                            Communication Communication Communication: No apparent difficulties  Cognition Arousal: Alert Behavior During Therapy: WFL for tasks assessed/performed   PT - Cognitive impairments: No apparent impairments                         Following commands: Intact  Cueing Cueing Techniques: Verbal cues  Exercises      General Comments General comments (skin integrity, edema, etc.): spouse in room and supportive; pt reports SOB after activity, re-donned O2 in recliner      Pertinent Vitals/Pain Pain Assessment Pain Assessment: Faces Faces Pain Scale: Hurts little more Pain Location: back with fatigue while standing and stepping to get to recliner Pain Descriptors / Indicators: Aching, Guarding, Discomfort Pain Intervention(s): Monitored during session, Limited activity within patient's tolerance, Repositioned    Home  Living                          Prior Function            PT Goals (current goals can now be found in the care plan section) Progress towards PT goals: Progressing toward goals    Frequency    Min 1X/week      PT Plan      Co-evaluation PT/OT/SLP Co-Evaluation/Treatment: Yes Reason for Co-Treatment: To address functional/ADL transfers;For patient/therapist safety PT goals addressed during session: Mobility/safety with mobility;Balance;Proper use of DME OT goals addressed during session: Strengthening/ROM;ADL's and self-care      AM-PAC PT "6 Clicks" Mobility   Outcome Measure  Help needed turning from your back to your side while in a flat bed without using bedrails?: A Little Help needed moving from lying on your back to sitting on the side of a flat bed without using bedrails?: A Little Help needed moving to and from a bed to a chair (including a wheelchair)?: A Little Help needed standing up from a chair using your arms (e.g., wheelchair or bedside chair)?: A Lot Help needed to walk in hospital room?: Total Help needed climbing 3-5 steps with a railing? : Total 6 Click Score: 13    End of Session Equipment Utilized During Treatment: Gait belt;Other (comment) (R LE prosthesis) Activity Tolerance: Patient tolerated treatment well Patient left: in chair;with call bell/phone within reach;with family/visitor present Nurse Communication: Mobility status;Need for lift equipment PT Visit Diagnosis: Unsteadiness on feet (R26.81);Muscle weakness (generalized) (M62.81);Difficulty in walking, not elsewhere classified (R26.2)     Time: 0865-7846 PT Time Calculation (min) (ACUTE ONLY): 20 min  Charges:    $Therapeutic Activity: 8-22 mins PT General Charges $$ ACUTE PT VISIT: 1 Visit                     Sheran Lawless, PT Acute Rehabilitation Services Office:[phone] 08/12/2023    Elray Mcgregor 08/12/2023, 2:05 PM

## 2023-08-12 NOTE — Progress Notes (Signed)
 Occupational Therapy Treatment Patient Details Name: Susan [name] MRN: [id number] DOB: August 20, 1950 Today's Date: 08/12/2023   History of present illness Pt is a 73 y/o female presenting on 3/13 with abdominal pain, dysuria, and back pain. UA consistent with UTI, E. coli bacteremia. PMH includes: ESRD on TTS HD, PAF on eliquis, CAD s/p PCI, chronic HFrER, DM2, HTN, anemia of chronic disease, R BKA.   OT comments  Patient progressing well.  Limited by bowel incontinence during session.  Spouse assisted with donning LB prosthetic supine, pt has hospital bed at home and encouraged spouse to increased height of bed to protect his back once home. Pt completing bed mobility with min assist, transfers with mod assist +2 safety with bowel incontinence multiple times (every stand) during session. Fatigues easily but able to step to [facility] and recliner using RW today.  Updated dc plan as pt has good support from spouse and is hopeful to return to outpatient services, believe she will benefit from outpatient OT.        If plan is discharge home, recommend the following:  Two people to help with walking and/or transfers;A lot of help with bathing/dressing/bathroom;Assistance with cooking/housework;Assist for transportation;Help with stairs or ramp for entrance   Equipment Recommendations  None recommended by OT    Recommendations for Other Services      Precautions / Restrictions Precautions Precautions: Fall Recall of Precautions/Restrictions: Intact Precaution/Restrictions Comments: incontience Other Brace: R LE prosthetic Restrictions Weight Bearing Restrictions Per Provider Order: No       Mobility Bed Mobility Overal bed mobility: Needs Assistance Bed Mobility: Rolling, Sidelying to Sit Rolling: Supervision Sidelying to sit: Used rails, Min assist, HOB elevated       General bed mobility comments: assist for trunk, cueing to scoot    Transfers Overall transfer level: Needs  assistance Equipment used: Rolling walker (2 wheels) Transfers: Sit to/from Stand Sit to Stand: From elevated surface, Mod assist, +2 safety/equipment     Step pivot transfers: +2 safety/equipment, Mod assist     General transfer comment: up from elevated bed to stand with questioning cues for hand placement, assist for balance as pt leaning over and incontinent of stool along the way to [facility]; cues for stepping and backing to chair, reaching for armrests; Stepping from [facility] to recliner with increased distance and mod A for balance, walker management and cues for stepping.     Balance Overall balance assessment: Needs assistance Sitting-balance support: No upper extremity supported, Feet unsupported Sitting balance-Leahy Scale: Fair     Standing balance support: Bilateral upper extremity supported, During functional activity Standing balance-Leahy Scale: Poor Standing balance comment: pt relies on BUE support                           ADL either performed or assessed with clinical judgement   ADL Overall ADL's : Needs assistance/impaired                 Upper Body Dressing : Set up;Sitting   Lower Body Dressing: Total assistance Lower Body Dressing Details (indicate cue type and reason): spouse assisting with donning prosthetic and L shoe Toilet Transfer: Moderate assistance;+2 for safety/equipment Toilet Transfer Details (indicate cue type and reason): to [facility] Toileting- Clothing Manipulation and Hygiene: Total assistance;+2 for physical assistance Toileting - Clothing Manipulation Details (indicate cue type and reason): assist for hygiene at bed level and standing, incontinent of bowel each stand today     Functional  mobility during ADLs: Moderate assistance;+2 for safety/equipment      Extremity/Trunk Assessment              Vision       Perception     Praxis     Communication Communication Communication: No apparent difficulties   Cognition  Arousal: Alert Behavior During Therapy: WFL for tasks assessed/performed Cognition: No apparent impairments                               Following commands: Intact        Cueing   Cueing Techniques: Verbal cues  Exercises      Shoulder Instructions       General Comments spouse in room and supportive; pt reports SOB after activity, re-donned O2 in recliner    Pertinent Vitals/ Pain       Pain Assessment Pain Assessment: Faces Faces Pain Scale: Hurts little more Pain Location: back with fatigue while standing and stepping to get to recliner Pain Descriptors / Indicators: Aching, Guarding, Discomfort Pain Intervention(s): Limited activity within patient's tolerance, Monitored during session, Repositioned  Home Living                                          Prior Functioning/Environment              Frequency  Min 2X/week        Progress Toward Goals  OT Goals(current goals can now be found in the care plan section)  Progress towards OT goals: Progressing toward goals  Acute Rehab OT Goals Patient Stated Goal: home OT Goal Formulation: With patient Time For Goal Achievement: 08/21/23 Potential to Achieve Goals: Fair  Plan      Co-evaluation    PT/OT/SLP Co-Evaluation/Treatment: Yes Reason for Co-Treatment: To address functional/ADL transfers;For patient/therapist safety PT goals addressed during session: Mobility/safety with mobility;Balance;Proper use of DME OT goals addressed during session: Strengthening/ROM;ADL's and self-care      AM-PAC OT "6 Clicks" Daily Activity     Outcome Measure   Help from another person eating meals?: None Help from another person taking care of personal grooming?: A Little Help from another person toileting, which includes using toliet, bedpan, or urinal?: Total Help from another person bathing (including washing, rinsing, drying)?: A Lot Help from another person to put on and taking  off regular upper body clothing?: A Little Help from another person to put on and taking off regular lower body clothing?: Total 6 Click Score: 14    End of Session    OT Visit Diagnosis: Other abnormalities of gait and mobility (R26.89);Muscle weakness (generalized) (M62.81);Pain Pain - Right/Left: Left Pain - part of body:  (back)   Activity Tolerance Patient tolerated treatment well   Patient Left with call bell/phone within reach;in chair;with chair alarm set;with family/visitor present   Nurse Communication Mobility status;Need for lift equipment (stedy)        Time: 9811-9147 OT Time Calculation (min): 39 min  Charges: OT General Charges $OT Visit: 1 Visit OT Treatments $Self Care/Home Management : 23-37 mins  Barry Brunner, OT Acute Rehabilitation Services Office [phone] Secure Chat Preferred    Chancy Milroy 08/12/2023, 2:05 PM

## 2023-08-12 NOTE — Plan of Care (Signed)

## 2023-08-13 ENCOUNTER — Inpatient Hospital Stay (HOSPITAL_COMMUNITY)

## 2023-08-13 DIAGNOSIS — N390 Urinary tract infection, site not specified: Secondary | ICD-10-CM | POA: Diagnosis not present

## 2023-08-13 LAB — RENAL FUNCTION PANEL
Albumin: 2 g/dL — ABNORMAL LOW (ref 3.5–5.0)
Anion gap: 8 (ref 5–15)
BUN: 45 mg/dL — ABNORMAL HIGH (ref 8–23)
CO2: 26 mmol/L (ref 22–32)
Calcium: 8.6 mg/dL — ABNORMAL LOW (ref 8.9–10.3)
Chloride: 95 mmol/L — ABNORMAL LOW (ref 98–111)
Creatinine, Ser: 5.47 mg/dL — ABNORMAL HIGH (ref 0.44–1.00)
GFR, Estimated: 8 mL/min — ABNORMAL LOW (ref >60)
Glucose, Bld: 145 mg/dL — ABNORMAL HIGH (ref 70–99)
Phosphorus: 3.3 mg/dL (ref 2.5–4.6)
Potassium: 3.5 mmol/L (ref 3.5–5.1)
Sodium: 129 mmol/L — ABNORMAL LOW (ref 135–145)

## 2023-08-13 LAB — CBC WITH DIFFERENTIAL/PLATELET
Abs Immature Granulocytes: 0 10*3/uL (ref 0.00–0.07)
Basophils Absolute: 0.1 10*3/uL (ref 0.0–0.1)
Basophils Relative: 1 %
Eosinophils Absolute: 0.3 10*3/uL (ref 0.0–0.5)
Eosinophils Relative: 4 %
HCT: 28.1 % — ABNORMAL LOW (ref 36.0–46.0)
Hemoglobin: 9.1 g/dL — ABNORMAL LOW (ref 12.0–15.0)
Lymphocytes Relative: 11 %
Lymphs Abs: 0.9 10*3/uL (ref 0.7–4.0)
MCH: 30 pg (ref 26.0–34.0)
MCHC: 32.4 g/dL (ref 30.0–36.0)
MCV: 92.7 fL (ref 80.0–100.0)
Monocytes Absolute: 0.2 10*3/uL (ref 0.1–1.0)
Monocytes Relative: 3 %
Neutro Abs: 6.7 10*3/uL (ref 1.7–7.7)
Neutrophils Relative %: 81 %
Platelets: 215 10*3/uL (ref 150–400)
RBC: 3.03 MIL/uL — ABNORMAL LOW (ref 3.87–5.11)
RDW: 14.6 % (ref 11.5–15.5)
WBC: 8.3 10*3/uL (ref 4.0–10.5)
nRBC: 0 % (ref 0.0–0.2)
nRBC: 0 /100{WBCs}

## 2023-08-13 LAB — GLUCOSE, CAPILLARY
Glucose-Capillary: 133 mg/dL — ABNORMAL HIGH (ref 70–99)
Glucose-Capillary: 130 mg/dL — ABNORMAL HIGH (ref 70–99)

## 2023-08-13 MED ORDER — HEPARIN SODIUM (PORCINE) 1000 UNIT/ML IJ SOLN
3800.0000 [IU] | Freq: Once | INTRAMUSCULAR | Status: AC
  Administered 2023-08-13: 3800 [IU]
  Filled 2023-08-13: qty 4

## 2023-08-13 MED ORDER — PENTAFLUOROPROP-TETRAFLUOROETH EX AERO: 1.0000 | INHALATION_SPRAY | CUTANEOUS | Status: DC | PRN

## 2023-08-13 MED ORDER — ALTEPLASE 2 MG IJ SOLR: 2.0000 mg | Freq: Once | INTRAMUSCULAR | Status: DC | PRN

## 2023-08-13 MED ORDER — LIDOCAINE HCL (PF) 1 % IJ SOLN
5.0000 mL | INTRAMUSCULAR | Status: DC | PRN
Start: 2023-08-13 — End: 2023-08-13

## 2023-08-13 MED ORDER — OXYCODONE-ACETAMINOPHEN 5-325 MG PO TABS: 1.0000 | ORAL_TABLET | Freq: Four times a day (QID) | ORAL | 0 refills | Status: DC | PRN

## 2023-08-13 MED ORDER — CALCITRIOL 0.25 MCG PO CAPS: 2.2500 ug | ORAL_CAPSULE | ORAL | 0 refills | Status: DC

## 2023-08-13 MED ORDER — HEPARIN SODIUM (PORCINE) 1000 UNIT/ML DIALYSIS: 1000.0000 [IU] | INTRAMUSCULAR | Status: DC | PRN

## 2023-08-13 MED ORDER — LIDOCAINE-PRILOCAINE 2.5-2.5 % EX CREA: 1.0000 | TOPICAL_CREAM | CUTANEOUS | Status: DC | PRN

## 2023-08-13 NOTE — Discharge Summary (Signed)
 Physician Discharge Summary  YANIQUE MULVIHILL GEX:[id number] DOB: [birth year] DOA: 08/05/2023  PCP: Donita Brooks, MD  Admit date: 08/05/2023 Discharge date: 08/13/2023  Admitted From: Home Discharge disposition: Home with outpatient PT  Recommendations at discharge:  Cautious use of pain medicines   Brief narrative: Susan [name] is a 73 y.o. female with PMH significant for ESRD-HD-TTS, PAF on Eliquis, CAD status post PCI, chronic systolic CHF with EF 30-35%, DM2, HTN, HLD, hypothyroidism, anemia of CKD, right BKA, and depression/anxiety  3/12, patient  presented to the ER with 24 hours of dysuria associated with abdominal and back pain as well as intermittent shaking chills.  In the ER her blood pressure was low at 76/41 with a temperature of 98.1 WBC of 16.7.  UA was suggestive of UTI.  CT abd/pelvis at presentation noted a small amount of air in the bladder in the proximal left ureter which could be reflective of emphysematous pyelitis Admitted to [facility] with UTI Urine culture and blood culture grew ESBL E. coli  Subjective: Patient was seen and examined this afternoon Pleasant elderly Caucasian female.  Propped up in bed.  On low-flow oxygen. Not in distress. Back pain improving after Percocet given last night.  Patient is agreeable to try as needed Percocet at home.  Hospital course: Complicated ESBL E. coli UTI E. 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 Admitted for UTI.   Started on broad-spectrum IV antibiotics Urine culture and blood culture grew ESBL E. Coli Repeat culture on 3/14 did not show any growth Given persistence of abdominal symptoms, CT abdomen was repeated on 3/16 which did not show any additional acute findings.  It showed persistent gas within the bladder but fortunately no evidence of active pyelonephritis or perinephric abscess and resolution of gas within the proximal  left ureter. Clinically making slow but measurable progress.  She was reluctant to try opioids but last night she was in severe pain and took 1 dose of Percocet.  She felt much better after that.  She is agreeable to try as needed Percocet at home. Completed 7-day course of IV meropenem today Recent Labs  Lab 08/07/23 0628 08/08/23 0253 08/09/23 0528 08/11/23 0345 08/13/23 0434  WBC 11.3* 7.9 7.5 8.0 8.3   ESRD-HD-TTS Nephrology involved. 3/17, right subclavian dialysis catheter was changed out by IR because of bacteremia  Hypokalemia/hypophosphatemia Related to dialysis.  Replacement given Recent Labs  Lab 08/08/23 0253 08/09/23 0528 08/10/23 0336 08/11/23 0345 08/12/23 0345 08/13/23 0434  K 3.7 3.4* 3.8 3.8 3.4* 3.5  MG 2.1  --   --   --   --   --   PHOS 4.3 2.3* 3.4 4.0 2.2* 3.3   Chronic hyponatremia Likely due to dialysis status.  Low but stable sodium level Recent Labs  Lab 08/07/23 0628 08/08/23 0253 08/09/23 0528 08/10/23 0336 08/11/23 0345 08/12/23 0345 08/13/23 0434  NA 132* 127* 133* 127* 128* 130* 129*   Chronic hypotension Chronic systolic CHF Patient has some chronic hypotension which was worsened at admission -blood pressure has now stabilized Chronically on midodrine with dialysis Most recent echo from April 2024 with EF 30 to 35%, moderate to severe LV dysfunction  PAF  Heart rate controlled with amiodarone Chronically anticoagulated with Eliquis    CAD, HLD Eliquis and statin  Type 2 diabetes mellitus uncontrolled with hyperglycemia A1c 9 on 02/27/2023 PTA meds-Lantus 31 units daily.  Continue same. Recent Labs  Lab  08/12/23 1144 08/12/23 1626 08/12/23 1942 08/13/23 0736 08/13/23 1320  GLUCAP 138* 165* 153* 133* 130*   Anemia of CKD Hemoglobin stable between 9 and 10. Recent Labs    08/07/23 0628 08/08/23 0253 08/09/23 0528 08/11/23 0345 08/13/23 0434  HGB 9.9* 8.8* 9.2* 9.3* 9.1*  MCV 90.4 92.3 90.8 91.5 92.7  FERRITIN  2,289*  --   --   --   --   TIBC 175*  --   --   --   --   IRON 24*  --   --   --   --    Hypothyroidism Continue Synthroid   Depression/anxiety Continue Klonopin nightly, Ativan nightly, melatonin nightly, Effexor daily   Mobility: Working with PT  Goals of care   Code Status: Full Code   Consultants: Nephrology Family Communication: None at bedside  Diet:  Diet Order             Diet general           Diet renal/carb modified with fluid restriction Diet-HS Snack? Nothing; Fluid restriction: 1200 mL Fluid; Room service appropriate? Yes; Fluid consistency: Thin  Diet effective now                   Nutritional status:  Body mass index is 28.1 kg/m.       Wounds:  - Wound / Incision (Open or Dehisced) 11/10/22 Diabetic ulcer Pretibial Left;Lateral;Lower (Active)  Date First Assessed/Time First Assessed: 11/10/22 2300   Wound Type: Diabetic ulcer  Location: Pretibial  Location Orientation: Left;Lateral;Lower  Present on Admission: Yes    Assessments 11/10/2022 11:36 PM 02/13/2023  9:02 AM  Dressing Type Dakin's-soaked gauze Gauze (Comment)  Dressing Changed Changed --  Dressing Status Clean, Dry, Intact Clean, Dry, Intact  Dressing Change Frequency -- Twice a day  Site / Wound Assessment -- Clean;Pink;Red  Peri-wound Assessment -- Intact  Margins -- Unattached edges (unapproximated)  Closure -- None     No associated orders.     Pressure Injury 01/29/23 Sacrum Stage 2 -  Partial thickness loss of dermis presenting as a shallow open injury with a red, pink wound bed without slough. (Active)  Date First Assessed/Time First Assessed: 01/29/23 1550   Location: Sacrum  Staging: Stage 2 -  Partial thickness loss of dermis presenting as a shallow open injury with a red, pink wound bed without slough.    Assessments 01/29/2023  3:50 PM 08/12/2023  8:29 PM  Dressing Type Foam - Lift dressing to assess site every shift Foam - Lift dressing to assess site every shift   Dressing -- Clean, Dry, Intact  Dressing Change Frequency PRN --  State of Healing Epithelialized --  Site / Wound Assessment Clean;Bleeding --  % Wound base Red or Granulating 100% --  Peri-wound Assessment Intact --  Drainage Amount Scant --  Drainage Description Serous --     No associated orders.     Wound / Incision (Open or Dehisced) 02/05/23 Venous stasis ulcer Leg Left;Lateral 3 cm x 1 cm x 0.1 cm 100% pink moist (Active)  Date First Assessed: 02/05/23   Wound Type: Venous stasis ulcer  Location: Leg  Location Orientation: Left;Lateral  Wound Description (Comments): 3 cm x 1 cm x 0.1 cm 100% pink moist  Present on Admission: Yes    Assessments 02/09/2023  8:40 AM 02/16/2023 10:00 AM  Dressing Type Foam - Lift dressing to assess site every shift Foam - Lift dressing to assess site every shift  Dressing Changed Changed --  Dressing Status Clean, Dry, Intact Clean, Dry, Intact  Dressing Change Frequency Daily Daily  Site / Wound Assessment Pink Dressing in place / Unable to assess  Peri-wound Assessment Intact --  Margins Unattached edges (unapproximated) --  Closure None --     No associated orders.    Discharge Exam:   Vitals:   08/13/23 1238 08/13/23 1249 08/13/23 1252 08/13/23 1320  BP: 128/62 (!) 119/59  (!) 106/47  Pulse: 75 80  77  Resp: 12 14  18   Temp: 98.3 F (36.8 C)     TempSrc: Oral     SpO2: 100% 99%  100%  Weight:   76.6 kg   Height:        Body mass index is 28.1 kg/m.  General exam: Pleasant, elderly Caucasian female. Skin: No rashes, lesions or ulcers. HEENT: Atraumatic, normocephalic, no obvious bleeding Lungs: Clear to auscultation bilaterally,  CVS: S1, S2, no murmur,   GI/Abd: Soft, nontender, nondistended, bowel sound present, improving left CVA tenderness present CNS: Alert, awake, oriented x 3 Psychiatry: Mood appropriate,  Extremities: No pedal edema, no calf tenderness,   Follow ups:    Follow-up Information     Donita Brooks, MD Follow up.   Specialty: Family Medicine Contact information: [address] [place] [address] [phone]                 Discharge Instructions:   Discharge Instructions     Ambulatory referral to Occupational Therapy   Complete by: As directed    On [address]   Ambulatory referral to Physical Therapy   Complete by: As directed    On [place].   Call MD for:  difficulty breathing, headache or visual disturbances   Complete by: As directed    Call MD for:  extreme fatigue   Complete by: As directed    Call MD for:  hives   Complete by: As directed    Call MD for:  persistant dizziness or light-headedness   Complete by: As directed    Call MD for:  persistant nausea and vomiting   Complete by: As directed    Call MD for:  severe uncontrolled pain   Complete by: As directed    Call MD for:  temperature >100.4   Complete by: As directed    Diet general   Complete by: As directed    Renal, carb controlled diet   Discharge instructions   Complete by: As directed    Recommendations at discharge:   Cautious use of pain medicines  PDMP reviewed this encounter.   Opioid taper instructions: It is important to wean off of your opioid medication as soon as possible. If you do not need pain medication after your surgery it is ok to stop day one. Opioids include: Codeine, Hydrocodone(Norco, Vicodin), Oxycodone(Percocet, oxycontin) and hydromorphone amongst others.  Long term and even short term use of opiods can cause: Increased pain response Dependence Constipation Depression Respiratory depression And more.  Withdrawal symptoms can include Flu like symptoms Nausea, vomiting And more Techniques to manage these symptoms Hydrate well Eat regular healthy meals Stay active Use relaxation techniques(deep breathing, meditating, yoga) Do Not substitute Alcohol to help with tapering If you have been on opioids for less than two weeks and do not  have pain than it is ok to stop all together.  Plan to wean off of opioids This plan should start within one week post  op of your joint replacement. Maintain the same interval or time between taking each dose and first decrease the dose.  Cut the total daily intake of opioids by one tablet each day Next start to increase the time between doses. The last dose that should be eliminated is the evening dose.        General discharge instructions: Follow with Primary MD Donita Brooks, MD in 7 days  Please request your PCP  to go over your hospital tests, procedures, radiology results at the follow up. Please get your medicines reviewed and adjusted.  Your PCP may decide to repeat certain labs or tests as needed. Do not drive, operate heavy machinery, perform activities at heights, swimming or participation in water activities or provide baby sitting services if your were admitted for syncope or siezures until you have seen by Primary MD or a Neurologist and advised to do so again. [place] Controlled Substance Reporting System database was reviewed. Do not drive, operate heavy machinery, perform activities at heights, swim, participate in water activities or provide baby-sitting services while on medications for pain, sleep and mood until your outpatient physician has reevaluated you and advised to do so again.  You are strongly recommended to comply with the dose, frequency and duration of prescribed medications. Activity: As tolerated with Full fall precautions use walker/cane & assistance as needed Avoid using any recreational substances like cigarette, tobacco, alcohol, or non-prescribed drug. If you experience worsening of your admission symptoms, develop shortness of breath, life threatening emergency, suicidal or homicidal thoughts you must seek medical attention immediately by calling 911 or calling your MD immediately  if symptoms less severe. You must read complete  instructions/literature along with all the possible adverse reactions/side effects for all the medicines you take and that have been prescribed to you. Take any new medicine only after you have completely understood and accepted all the possible adverse reactions/side effects.  Wear Seat belts while driving. You were cared for by a hospitalist during your hospital stay. If you have any questions about your discharge medications or the care you received while you were in the hospital after you are discharged, you can call the unit and ask to speak with the hospitalist or the covering physician. Once you are discharged, your primary care physician will handle any further medical issues. Please note that NO REFILLS for any discharge medications will be authorized once you are discharged, as it is imperative that you return to your primary care physician (or establish a relationship with a primary care physician if you do not have one).   Discharge wound care:   Complete by: As directed    Increase activity slowly   Complete by: As directed        Discharge Medications:   Allergies as of 08/13/2023       Reactions   Cephalexin Diarrhea, Other (See Comments)   Codeine Nausea And Vomiting, Other (See Comments)        Medication List     STOP taking these medications    prochlorperazine 5 MG tablet Commonly known as: COMPAZINE       TAKE these medications    acetaminophen 325 MG tablet Commonly known as: TYLENOL Take 2 tablets (650 mg total) by mouth every 6 (six) hours as needed for mild pain (or Fever >/= 101).   albuterol 108 (90 Base) MCG/ACT inhaler Commonly known as: VENTOLIN HFA Inhale 1-2 puffs into the lungs every 6 (six) hours as needed for  shortness of breath.   amiodarone 200 MG tablet Commonly known as: PACERONE TAKE 1 TABLET BY MOUTH EVERY DAY   apixaban 5 MG Tabs tablet Commonly known as: ELIQUIS Take 1 tablet (5 mg total) by mouth 2 (two) times daily.    ascorbic acid 500 MG tablet Commonly known as: VITAMIN C Take 1 tablet (500 mg total) by mouth daily.   atorvastatin 80 MG tablet Commonly known as: LIPITOR TAKE 1 TABLET BY MOUTH EVERY DAY   calcitRIOL 0.25 MCG capsule Commonly known as: ROCALTROL Take 9 capsules (2.25 mcg total) by mouth Every Tuesday,Thursday,and Saturday with dialysis. Start taking on: August 15, 2023   clonazePAM 0.5 MG disintegrating tablet Commonly known as: KLONOPIN Take 1 tablet (0.5 mg total) by mouth 2 (two) times daily. What changed: when to take this   folic acid 1 MG tablet Commonly known as: FOLVITE Take 1 mg by mouth daily.   folic acid-vitamin b complex-vitamin c-selenium-zinc 3 MG Tabs tablet Take 1 tablet by mouth daily.   insulin aspart 100 UNIT/ML injection Commonly known as: novoLOG Inject 0-15 Units into the skin 3 (three) times daily with meals. CBG < 70: treat low blood sugar, CBG 70 - 120: 0 units, CBG 121 - 150: 2 units, CBG 151 - 200: 3 units, CBG 201 - 250: 5 units, CBG 251 - 300: 8 units, CBG 301 - 350: 11 units, CBG 351 - 400: 15 units, CBG > 400: call MD   insulin glargine 100 UNIT/ML Solostar Pen Commonly known as: LANTUS Inject 26 Units into the skin daily. What changed:  how much to take when to take this   levothyroxine 50 MCG tablet Commonly known as: SYNTHROID Take 50 mcg by mouth daily before breakfast.   LORazepam 0.5 MG tablet Commonly known as: ATIVAN TAKE 1 TABLET BY MOUTH AT BEDTIME.   melatonin 3 MG Tabs tablet Take 3 mg by mouth at bedtime.   methocarbamol 750 MG tablet Commonly known as: ROBAXIN TAKE 1 TABLET (750 MG TOTAL) BY MOUTH EVERY 6 (SIX) HOURS AS NEEDED FOR MUSCLE SPASMS   midodrine 10 MG tablet Commonly known as: PROAMATINE Take 1 tablet (10 mg total) by mouth daily. What changed:  how much to take when to take this   MIRCERA IJ Mircera   nitroGLYCERIN 0.4 MG SL tablet Commonly known as: NITROSTAT Place 1 tablet (0.4 mg total)  under the tongue every 5 (five) minutes as needed.   oxyCODONE-acetaminophen 5-325 MG tablet Commonly known as: PERCOCET/ROXICET Take 1 tablet by mouth every 6 (six) hours as needed for moderate pain (pain score 4-6).   OXYGEN Inhale 2 L into the lungs at bedtime.   pantoprazole 40 MG tablet Commonly known as: PROTONIX TAKE 1 TABLET (40 MG TOTAL) BY MOUTH TWICE A DAY BEFORE MEALS   polyethylene glycol 17 g packet Commonly known as: MIRALAX / GLYCOLAX Take 17 g by mouth daily. What changed:  when to take this reasons to take this   sevelamer carbonate 800 MG tablet Commonly known as: RENVELA Take 1,600 mg by mouth 3 (three) times daily with meals.   triamcinolone cream 0.1 % Commonly known as: KENALOG Apply 1 Application topically 2 (two) times daily. Apply to affected areas BID What changed:  when to take this additional instructions   venlafaxine XR 75 MG 24 hr capsule Commonly known as: EFFEXOR-XR TAKE 1 CAPSULE (75 MG TOTAL) BY MOUTH DAILY WITH BREAKFAST. STOP TRINTELLIX   Zinc 30 MG Caps Take 30 mg by  mouth daily.               Discharge Care Instructions  (From admission, onward)           Start     Ordered   08/13/23 0000  Discharge wound care:        08/13/23 1355             The results of significant diagnostics from this hospitalization (including imaging, microbiology, ancillary and laboratory) are listed below for reference.    Procedures and Diagnostic Studies:   CT Angio Chest/Abd/Pel for Dissection W and/or Wo Contrast Result Date: 08/05/2023 CLINICAL DATA:  Acute aortic syndrome suspected. End-stage renal disease. EXAM: CT ANGIOGRAPHY CHEST, ABDOMEN AND PELVIS TECHNIQUE: Multidetector CT imaging through the chest, abdomen and pelvis was performed using the standard protocol during bolus administration of intravenous contrast. Multiplanar reconstructed images and MIPs were obtained and reviewed to evaluate the vascular anatomy.  RADIATION DOSE REDUCTION: This exam was performed according to the departmental dose-optimization program which includes automated exposure control, adjustment of the mA and/or kV according to patient size and/or use of iterative reconstruction technique. CONTRAST:  70mL OMNIPAQUE IOHEXOL 350 MG/ML SOLN COMPARISON:  CT abdomen and pelvis 02/07/2023. CT angiogram chest abdomen and pelvis 08/27/2022. FINDINGS: CTA CHEST FINDINGS Cardiovascular: There is no evidence for aortic aneurysm or dissection. No central pulmonary embolism. The heart is enlarged. There is no pericardial effusion. There are atherosclerotic calcifications of the aorta and coronary arteries. Right-sided central venous catheter tip ends in the distal SVC. Mediastinum/Nodes: There is an enlarged precarinal lymph node measuring 14 mm, unchanged. There are calcified subcarinal and right hilar lymph nodes. Visualized thyroid gland and esophagus are within normal limits. Lungs/Pleura: There is dependent atelectasis in the lower lobes and left upper lobe. There are trace bilateral pleural effusions. There is a calcified granuloma in the right upper lobe. Musculoskeletal: No acute fractures are seen. Review of the MIP images confirms the above findings. CTA ABDOMEN AND PELVIS FINDINGS VASCULAR Aorta: No evidence for aortic aneurysm or dissection. Calcified atherosclerotic disease is present. Celiac: Patent without evidence of aneurysm, dissection, vasculitis or significant stenosis. SMA: Patent without evidence of aneurysm, dissection, vasculitis or significant stenosis. Renals: Patent without evidence of aneurysm, dissection, vasculitis or significant stenosis. IMA: Patent without evidence of aneurysm, dissection, vasculitis or significant stenosis. Inflow: Patent without evidence of aneurysm, dissection, vasculitis or significant stenosis. Veins: No obvious venous abnormality within the limitations of this arterial phase study. Review of the MIP images  confirms the above findings. NON-VASCULAR Hepatobiliary: Gallstones are present. No biliary ductal dilatation. There are calcified granulomas throughout the liver Pancreas: Unremarkable. No pancreatic ductal dilatation or surrounding inflammatory changes. Spleen: Calcified granulomas are present. Normal in size. Adrenals/Urinary Tract: Left adrenal nodule measures 14 mm. Right adrenal gland is within normal limits. There is mild left-sided hydroureteronephrosis. There is a small amount of air in the proximal left ureter. No obstructing calculus identified. Right peripelvic cyst measures 2.3 x 3.1 cm. The bladder is decompressed. There is a small amount of air in the bladder. Stomach/Bowel: Stomach is within normal limits. Appendix appears normal. There is rectal wall thickening. No focal inflammatory stranding. Appendix is within normal limits. Scattered colonic diverticula are present. The stomach is within normal limits. Lymphatic: No enlarged lymph nodes are seen. Reproductive: Status post hysterectomy. No adnexal masses. Other: No abdominal wall hernia or abnormality. No abdominopelvic ascites. Musculoskeletal: Degenerative changes affect the spine. Review of the MIP images confirms the above  findings. IMPRESSION: 1. No evidence for aortic aneurysm or dissection. 2. Cardiomegaly. 3. Trace bilateral pleural effusions with dependent atelectasis. 4. Stable mediastinal lymphadenopathy. 5. Cholelithiasis. 6. Mild left-sided hydroureteronephrosis. No obstructing calculus identified. 7. Small amount of air in the bladder and proximal left ureter. Findings may be related to recent instrumentation, infection or emphysematous pyelitis. 8. Rectal wall thickening worrisome for proctitis. 9. Colonic diverticulosis. Aortic Atherosclerosis (ICD10-I70.0) and Emphysema (ICD10-J43.9). Electronically Signed   By: Darliss Cheney M.D.   On: 08/05/2023 20:35     Labs:   Basic Metabolic Panel: Recent Labs  Lab 08/08/23 0253  08/09/23 0528 08/10/23 0336 08/11/23 0345 08/12/23 0345 08/13/23 0434  NA 127* 133* 127* 128* 130* 129*  K 3.7 3.4* 3.8 3.8 3.4* 3.5  CL 89* 96* 93* 94* 93* 95*  CO2 23 26 23 23 28 26   GLUCOSE 352* 130* 356* 188* 222* 145*  BUN 43* 21 33* 47* 27* 45*  CREATININE 4.85* 2.85* 4.52* 5.93* 4.01* 5.47*  CALCIUM 8.3* 8.7* 8.3* 8.8* 8.6* 8.6*  MG 2.1  --   --   --   --   --   PHOS 4.3 2.3* 3.4 4.0 2.2* 3.3   GFR Estimated Creatinine Clearance: 9.2 mL/min (A) (by C-G formula based on SCr of 5.47 mg/dL (H)). Liver Function Tests: Recent Labs  Lab 08/09/23 0528 08/10/23 0336 08/11/23 0345 08/12/23 0345 08/13/23 0434  ALBUMIN 2.1* 1.9* 2.0* 2.0* 2.0*   No results for input(s): "LIPASE", "AMYLASE" in the last 168 hours. No results for input(s): "AMMONIA" in the last 168 hours. Coagulation profile No results for input(s): "INR", "PROTIME" in the last 168 hours.  CBC: Recent Labs  Lab 08/07/23 0628 08/08/23 0253 08/09/23 0528 08/11/23 0345 08/13/23 0434  WBC 11.3* 7.9 7.5 8.0 8.3  NEUTROABS  --   --   --   --  6.7  HGB 9.9* 8.8* 9.2* 9.3* 9.1*  HCT 30.0* 27.7* 28.5* 29.2* 28.1*  MCV 90.4 92.3 90.8 91.5 92.7  PLT 258 240 258 232 215   Cardiac Enzymes: No results for input(s): "CKTOTAL", "CKMB", "CKMBINDEX", "TROPONINI" in the last 168 hours. BNP: Invalid input(s): "POCBNP" CBG: Recent Labs  Lab 08/12/23 1144 08/12/23 1626 08/12/23 1942 08/13/23 0736 08/13/23 1320  GLUCAP 138* 165* 153* 133* 130*   D-Dimer No results for input(s): "DDIMER" in the last 72 hours. Hgb A1c No results for input(s): "HGBA1C" in the last 72 hours. Lipid Profile No results for input(s): "CHOL", "HDL", "LDLCALC", "TRIG", "CHOLHDL", "LDLDIRECT" in the last 72 hours. Thyroid function studies No results for input(s): "TSH", "T4TOTAL", "T3FREE", "THYROIDAB" in the last 72 hours.  Invalid input(s): "FREET3" Anemia work up No results for input(s): "VITAMINB12", "FOLATE", "FERRITIN", "TIBC",  "IRON", "RETICCTPCT" in the last 72 hours. Microbiology Recent Results (from the past 240 hours)  Culture, blood (routine x 2)     Status: Abnormal   Collection Time: 08/05/23  3:02 PM   Specimen: BLOOD  Result Value Ref Range Status   Specimen Description BLOOD SITE NOT SPECIFIED  Final   Special Requests   Final    BOTTLES DRAWN AEROBIC AND ANAEROBIC Blood Culture adequate volume   Culture  Setup Time   Final    GRAM NEGATIVE RODS IN BOTH AEROBIC AND ANAEROBIC BOTTLES CRITICAL RESULT CALLED TO, READ BACK BY AND VERIFIED WITH: PHARMD EMILY on [id number] @0916  by SM Performed at [facility], [address] [place], [place] [address]    Culture ESCHERICHIA COLI (A)  Final  Report Status 08/08/2023 FINAL  Final   Organism ID, Bacteria ESCHERICHIA COLI  Final   Organism ID, Bacteria ESCHERICHIA COLI  Final      Susceptibility   Escherichia coli - KIRBY BAUER*    CEFAZOLIN RESISTANT Resistant    Escherichia coli - MIC*    AMPICILLIN >=32 RESISTANT Resistant     CEFEPIME 0.5 SENSITIVE Sensitive     CEFTAZIDIME >=64 RESISTANT Resistant     CEFTRIAXONE >=64 RESISTANT Resistant     CIPROFLOXACIN <=0.25 SENSITIVE Sensitive     GENTAMICIN <=1 SENSITIVE Sensitive     IMIPENEM 0.5 SENSITIVE Sensitive     TRIMETH/SULFA <=20 SENSITIVE Sensitive     AMPICILLIN/SULBACTAM >=32 RESISTANT Resistant     PIP/TAZO >=128 RESISTANT Resistant ug/mL    * ESCHERICHIA COLI    ESCHERICHIA COLI  Blood Culture ID Panel (Reflexed)     Status: Abnormal   Collection Time: 08/05/23  3:02 PM  Result Value Ref Range Status   Enterococcus faecalis NOT DETECTED NOT DETECTED Final   Enterococcus Faecium NOT DETECTED NOT DETECTED Final   Listeria monocytogenes NOT DETECTED NOT DETECTED Final   Staphylococcus species NOT DETECTED NOT DETECTED Final   Staphylococcus aureus (BCID) NOT DETECTED NOT DETECTED Final   Staphylococcus epidermidis NOT DETECTED NOT DETECTED Final   Staphylococcus lugdunensis NOT  DETECTED NOT DETECTED Final   Streptococcus species NOT DETECTED NOT DETECTED Final   Streptococcus agalactiae NOT DETECTED NOT DETECTED Final   Streptococcus pneumoniae NOT DETECTED NOT DETECTED Final   Streptococcus pyogenes NOT DETECTED NOT DETECTED Final   A.calcoaceticus-baumannii NOT DETECTED NOT DETECTED Final   Bacteroides fragilis NOT DETECTED NOT DETECTED Final   Enterobacterales DETECTED (A) NOT DETECTED Final    Comment: Enterobacterales represent a large order of gram negative bacteria, not a single organism. CRITICAL RESULT CALLED TO, READ BACK BY AND VERIFIED WITH: PHARMD EMILY on [id number] @0916  by SM    Enterobacter cloacae complex NOT DETECTED NOT DETECTED Final   Escherichia coli DETECTED (A) NOT DETECTED Final    Comment: rbv PHARMD EMILY on [id number] @0916  by SM    Klebsiella aerogenes NOT DETECTED NOT DETECTED Final   Klebsiella oxytoca NOT DETECTED NOT DETECTED Final   Klebsiella pneumoniae NOT DETECTED NOT DETECTED Final   Proteus species NOT DETECTED NOT DETECTED Final   Salmonella species NOT DETECTED NOT DETECTED Final   Serratia marcescens NOT DETECTED NOT DETECTED Final   Haemophilus influenzae NOT DETECTED NOT DETECTED Final   Neisseria meningitidis NOT DETECTED NOT DETECTED Final   Pseudomonas aeruginosa NOT DETECTED NOT DETECTED Final   Stenotrophomonas maltophilia NOT DETECTED NOT DETECTED Final   Candida albicans NOT DETECTED NOT DETECTED Final   Candida auris NOT DETECTED NOT DETECTED Final   Candida glabrata NOT DETECTED NOT DETECTED Final   Candida krusei NOT DETECTED NOT DETECTED Final   Candida parapsilosis NOT DETECTED NOT DETECTED Final   Candida tropicalis NOT DETECTED NOT DETECTED Final   Cryptococcus neoformans/gattii NOT DETECTED NOT DETECTED Final   CTX-M ESBL NOT DETECTED NOT DETECTED Final   Carbapenem resistance IMP NOT DETECTED NOT DETECTED Final   Carbapenem resistance KPC NOT DETECTED NOT DETECTED Final   Carbapenem resistance NDM NOT  DETECTED NOT DETECTED Final   Carbapenem resist OXA 48 LIKE NOT DETECTED NOT DETECTED Final   Carbapenem resistance VIM NOT DETECTED NOT DETECTED Final    Comment: Performed at [facility], [address] [place], [place] [address]  Culture, blood (routine x 2)  Status: Abnormal   Collection Time: 08/05/23  4:23 PM   Specimen: BLOOD  Result Value Ref Range Status   Specimen Description BLOOD LEFT ANTECUBITAL  Final   Special Requests   Final    BOTTLES DRAWN AEROBIC AND ANAEROBIC Blood Culture results may not be optimal due to an inadequate volume of blood received in culture bottles   Culture  Setup Time   Final    CRITICAL VALUE NOTED.  VALUE IS CONSISTENT WITH PREVIOUSLY REPORTED AND CALLED VALUE. IN BOTH AEROBIC AND ANAEROBIC BOTTLES    Culture (A)  Final    ESCHERICHIA COLI SUSCEPTIBILITIES PERFORMED ON PREVIOUS CULTURE WITHIN THE LAST 5 DAYS. Performed at [facility], [address] [place], [place] [address]    Report Status 08/08/2023 FINAL  Final  Urine Culture     Status: Abnormal   Collection Time: 08/05/23  6:16 PM   Specimen: Urine, Random  Result Value Ref Range Status   Specimen Description URINE, RANDOM  Final   Special Requests   Final    NONE Reflexed from [id number] Performed at [facility], [address], [place], [place] [address]    Culture (A)  Final    >=100,000 COLONIES/mL ESCHERICHIA COLI Confirmed Extended Spectrum Beta-Lactamase Producer (ESBL).  In bloodstream infections from ESBL organisms, carbapenems are preferred over piperacillin/tazobactam. They are shown to have a lower risk of mortality.    Report Status 08/07/2023 FINAL  Final   Organism ID, Bacteria ESCHERICHIA COLI (A)  Final      Susceptibility   Escherichia coli - MIC*    AMPICILLIN >=32 RESISTANT Resistant     CEFAZOLIN >=64 RESISTANT Resistant     CEFEPIME 1 SENSITIVE Sensitive     CEFTRIAXONE >=64 RESISTANT Resistant     CIPROFLOXACIN <=0.25 SENSITIVE  Sensitive     GENTAMICIN <=1 SENSITIVE Sensitive     IMIPENEM 0.5 SENSITIVE Sensitive     NITROFURANTOIN <=16 SENSITIVE Sensitive     TRIMETH/SULFA <=20 SENSITIVE Sensitive     AMPICILLIN/SULBACTAM >=32 RESISTANT Resistant     PIP/TAZO >=128 RESISTANT Resistant ug/mL    * >=100,000 COLONIES/mL ESCHERICHIA COLI  Culture, blood (Routine X 2) w Reflex to ID Panel     Status: None   Collection Time: 08/07/23 11:01 AM   Specimen: BLOOD RIGHT HAND  Result Value Ref Range Status   Specimen Description BLOOD RIGHT HAND  Final   Special Requests   Final    BOTTLES DRAWN AEROBIC AND ANAEROBIC Blood Culture results may not be optimal due to an inadequate volume of blood received in culture bottles   Culture   Final    NO GROWTH 5 DAYS Performed at [facility], [address], [place], [place] [address]    Report Status 08/12/2023 FINAL  Final  Culture, blood (Routine X 2) w Reflex to ID Panel     Status: None   Collection Time: 08/07/23 11:10 AM   Specimen: BLOOD LEFT HAND  Result Value Ref Range Status   Specimen Description BLOOD LEFT HAND  Final   Special Requests   Final    BOTTLES DRAWN AEROBIC AND ANAEROBIC Blood Culture results may not be optimal due to an inadequate volume of blood received in culture bottles   Culture   Final    NO GROWTH 5 DAYS Performed at [facility], [address] [place], [place] [address]    Report Status 08/12/2023 FINAL  Final    Time  coordinating discharge: 45 minutes  Signed: Ebony Yorio  [facility] 08/13/2023, 1:55 PM

## 2023-08-13 NOTE — Progress Notes (Signed)
 Received patient in bed to unit.  Alert and oriented.  Informed consent signed and in chart.   TX duration: 3.5 hours  Due to patient having low BP and cramping at the end of today's dialysis session, patient was not able to hit prescribed goal. Transported back to the room  Alert, without acute distress.  Hand-off given to patient's nurse.   Access used: R internal jugular HD Cath Access issues: none  Total UF removed: 1L Medication(s) given: 100cc bolus given for cramping, midodrine, calcitriol, and Oxycodone/acetaminophen   08/13/23 1238  Vitals  Temp 98.3 F (36.8 C)  Temp Source Oral  BP 128/62  MAP (mmHg) 82  BP Location Right Arm  BP Method Automatic  Patient Position (if appropriate) Lying  Pulse Rate 75  Pulse Rate Source Monitor  ECG Heart Rate 76  Resp 12  Oxygen Therapy  SpO2 100 %  O2 Device Nasal Cannula  O2 Flow Rate (L/min) 3 L/min  During Treatment Monitoring  Duration of HD Treatment -hour(s) 3.5 hour(s)  Cumulative Fluid Removed (mL) per Treatment  1000.03  HD Safety Checks Performed Yes  Intra-Hemodialysis Comments Tx completed  Dialysis Fluid Bolus Normal Saline  Bolus Amount (mL) 300 mL  Post Treatment  Dialyzer Clearance Clear  Liters Processed 84  Fluid Removed (mL) 1000 mL  Tolerated HD Treatment No (Comment)  Post-Hemodialysis Comments Due to patient BP being lower than SBP 100 and cramping in last hour of session, we were not able to hit prescribed goal.  Hemodialysis Catheter Right Subclavian Double lumen Permanent (Tunneled)  Placement Date/Time: 08/10/23 1138   Placed prior to admission: Yes  Serial / Lot #: [id number]  Expiration Date: 01/04/28  Time Out: Correct patient;Correct site;Correct procedure  Maximum sterile barrier precautions: Hand hygiene;Cap;Mask;Sterile go...  Site Condition No complications  Blue Lumen Status Heparin locked;Dead end cap in place;Flushed  Red Lumen Status Heparin locked;Dead end cap in place;Flushed   Purple Lumen Status N/A  Catheter fill solution Heparin 1000 units/ml  Catheter fill volume (Arterial) 1.9 cc  Catheter fill volume (Venous) 1.9  Dressing Type Transparent  Dressing Status Antimicrobial disc/dressing in place;Clean, Dry, Intact  Interventions Other (Comment) (deaccessed)  Drainage Description None  Dressing Change Due 08/17/23  Post treatment catheter status Capped and Clamped     Stacie Glaze LPN [facility] D[facility]

## 2023-08-13 NOTE — Progress Notes (Signed)
 D/C order noted. Contacted [facility] to be advised of pt's d/c today and that pt should resume care on Saturday.   Olivia Canter Renal Navigator [phone]

## 2023-08-13 NOTE — TOC Transition Note (Signed)
 Transition of Care [facility]) - Discharge Note   Patient Details  Name: Susan [name] MRN: [id number] Date of Birth: 15-Sep-1950  Transition of Care [facility]) CM/SW Contact:  Tom-Johnson, Hershal Coria, RN Phone Number: 08/13/2023, 2:08 PM   Clinical Narrative:     Patient is scheduled for discharge today.  Readmission Risk Assessment done. Outpatient referral, hospital f/u and discharge instructions on AVS. Husband, Ramon Dredge to transport at discharge.  No further TOC needs noted.       Final next level of care: OP Rehab Barriers to Discharge: Barriers Resolved   Patient Goals and CMS Choice Patient states their goals for this hospitalization and ongoing recovery are:: To return home CMS Medicare.gov Compare Post Acute Care list provided to:: Patient Choice offered to / list presented to : Patient      Discharge Placement                Patient to be transferred to facility by: Husband Name of family member notified: [facility] and Services Additional resources added to the After Visit Summary for                  DME Arranged: N/A DME Agency: NA       HH Arranged: NA HH Agency: NA        Social Drivers of Health (SDOH) Interventions SDOH Screenings   Food Insecurity: No Food Insecurity (08/06/2023)  Housing: Low Risk  (08/06/2023)  Transportation Needs: No Transportation Needs (08/06/2023)  Utilities: Not At Risk (08/06/2023)  Alcohol Screen: Low Risk  (08/16/2021)  Depression (PHQ2-9): Low Risk  (09/29/2022)  Financial Resource Strain: Low Risk  (08/16/2021)  Physical Activity: Inactive (08/16/2021)  Social Connections: Moderately Isolated (08/06/2023)  Stress: No Stress Concern Present (08/16/2021)  Tobacco Use: Medium Risk (08/06/2023)     Readmission Risk Interventions    08/06/2023    4:00 PM 01/21/2023    3:09 PM 09/04/2022    5:11 PM  Readmission Risk Prevention Plan  Transportation Screening Complete Complete Complete  Medication  Review (RN Care Manager) Referral to Pharmacy Referral to Pharmacy Complete  PCP or Specialist appointment within 3-5 days of discharge Complete Complete Complete  HRI or Home Care Consult Complete Complete Complete  SW Recovery Care/Counseling Consult Complete Complete Complete  Palliative Care Screening Not Applicable Not Applicable Not Applicable  Skilled Nursing Facility Not Applicable Not Applicable Complete

## 2023-08-13 NOTE — Progress Notes (Signed)
 [facility] Progress Note   Subjective:    Seen and examined patient on HD. Informed of patient experiencing some cramping. Treatment is now complete and 1L was removed. She denies SOB, CP, and dizziness. Plan for discharge later today.  Objective Vitals:   08/13/23 1238 08/13/23 1249 08/13/23 1252 08/13/23 1320  BP: 128/62 (!) 119/59  (!) 106/47  Pulse: 75 80  77  Resp: 12 14  18   Temp: 98.3 F (36.8 C)     TempSrc: Oral     SpO2: 100% 99%  100%  Weight:   76.6 kg   Height:       Physical Exam General: alert female in NAD Heart: RRR, no murmurs, rubs or gallops Lungs: CTA bilaterally, respirations unlabored on 2L O2 Abdomen: Soft, non-distended, +BS Extremities: R BKA; No edema LLE Dialysis Access:  [facility] with intact bandage  Filed Weights   08/12/23 0318 08/13/23 0843 08/13/23 1252  Weight: 80.6 kg 77.6 kg 76.6 kg    Intake/Output Summary (Last 24 hours) at 08/13/2023 1427 Last data filed at 08/13/2023 1238 Gross per 24 hour  Intake 360 ml  Output 1000 ml  Net -640 ml    Additional Objective Labs: Basic Metabolic Panel: Recent Labs  Lab 08/11/23 0345 08/12/23 0345 08/13/23 0434  NA 128* 130* 129*  K 3.8 3.4* 3.5  CL 94* 93* 95*  CO2 23 28 26   GLUCOSE 188* 222* 145*  BUN 47* 27* 45*  CREATININE 5.93* 4.01* 5.47*  CALCIUM 8.8* 8.6* 8.6*  PHOS 4.0 2.2* 3.3   Liver Function Tests: Recent Labs  Lab 08/11/23 0345 08/12/23 0345 08/13/23 0434  ALBUMIN 2.0* 2.0* 2.0*   No results for input(s): "LIPASE", "AMYLASE" in the last 168 hours. CBC: Recent Labs  Lab 08/07/23 0628 08/08/23 0253 08/09/23 0528 08/11/23 0345 08/13/23 0434  WBC 11.3* 7.9 7.5 8.0 8.3  NEUTROABS  --   --   --   --  6.7  HGB 9.9* 8.8* 9.2* 9.3* 9.1*  HCT 30.0* 27.7* 28.5* 29.2* 28.1*  MCV 90.4 92.3 90.8 91.5 92.7  PLT 258 240 258 232 215   Blood Culture    Component Value Date/Time   SDES BLOOD LEFT HAND 08/07/2023 1110   SPECREQUEST  08/07/2023 1110     BOTTLES DRAWN AEROBIC AND ANAEROBIC Blood Culture results may not be optimal due to an inadequate volume of blood received in culture bottles   CULT  08/07/2023 1110    NO GROWTH 5 DAYS Performed at [facility], [address] [place], [place] [address]    REPTSTATUS 08/12/2023 FINAL 08/07/2023 1110    Cardiac Enzymes: No results for input(s): "CKTOTAL", "CKMB", "CKMBINDEX", "TROPONINI" in the last 168 hours. CBG: Recent Labs  Lab 08/12/23 1144 08/12/23 1626 08/12/23 1942 08/13/23 0736 08/13/23 1320  GLUCAP 138* 165* 153* 133* 130*   Iron Studies: No results for input(s): "IRON", "TIBC", "TRANSFERRIN", "FERRITIN" in the last 72 hours. Lab Results  Component Value Date   INR 1.4 (H) 05/22/2022   INR 1.5 (H) 02/01/2022   INR 1.3 (H) 01/14/2022   Studies/Results: DG CHEST PORT 1 VIEW Result Date: 08/13/2023 CLINICAL DATA:  10026, 03474. Shortness of breath. Volume ex or cess. EXAM: PORTABLE CHEST 1 VIEW COMPARISON:  AP Lat chest 07/06/2023. FINDINGS: There is a right IJ dialysis catheter terminating in the upper right atrium as before. Stable cardiomegaly. Interval increased central vascular prominence and mild generalized interstitial edema. There are small pleural effusions. There is patchy  opacity in the left lower lobe retrocardiac area which could be due to atelectasis, pneumonia or aspiration. No other focal infiltrate is seen. Mediastinum is stable. There is calcification of the transverse aorta. Thoracic spondylosis. IMPRESSION: 1. Interval increased central vascular prominence and mild generalized interstitial edema. 2. Small pleural effusions. 3. Patchy opacity in the left lower lobe retrocardiac area which could be due to atelectasis, pneumonia or aspiration. Electronically Signed   By: Almira Bar M.D.   On: 08/13/2023 07:50    Medications:   amiodarone  200 mg Oral Daily   apixaban  5 mg Oral BID   atorvastatin  80 mg Oral Daily   calcitRIOL  2.25 mcg Oral Q  T,Th,Sa-HD   Chlorhexidine Gluconate Cloth  6 each Topical Q0600   clonazePAM  0.5 mg Oral QHS   feeding supplement (NEPRO CARB STEADY)  237 mL Oral BID BM   insulin aspart  0-15 Units Subcutaneous TID WC   insulin aspart  0-5 Units Subcutaneous QHS   insulin aspart  4 Units Subcutaneous TID WC   insulin glargine  30 Units Subcutaneous Daily   levothyroxine  50 mcg Oral QAC breakfast   melatonin  3 mg Oral QHS   midodrine  10-20 mg Oral Q T,Th,Sa-HD   multivitamin  1 tablet Oral QHS   pantoprazole  40 mg Oral BID AC   polyethylene glycol  17 g Oral BID   senna-docusate  1 tablet Oral BID   sevelamer carbonate  1,600 mg Oral TID WC   venlafaxine XR  75 mg Oral Q breakfast    Dialysis Orders: [facility]. F180. 2K, 2Ca. Duration: 4h. EDW 77 kg. Access: R subclavian HD catheter. Mircera 50 mcg last given 02/22. Receives calcitriol 2.25 mcg with each session.    Assessment/Plan: # ESRD: On [facility] HD. No recent missed sessions. Initially hypokalemic on presentation but this corrected with supplementation. Renal function stable. Next HD 3/20 per regular schedule if she remains inpatient.   # Hyponatremia: Likely volume related, now improved. UF with HD as tolerated. Na now 130   # Volume/hypertension: Variable weights. Appears volume status is improving with HD. Continue to challenge EDW as tolerated. Currently denies SOB/CP after HD today. On Midodrine for hypotension.   # Anemia of Chronic Kidney Disease: Hemoglobin 9.3. Currently receiving venofer 100 mg TID with HD-holding now due to acute infection.    # Secondary Hyperparathyroidism/Hyperphosphatemia: Calcium and phos at goal. Receives calcitriol 2.25 mcg TID with HD, resumed here. Continue renvela.    # Vascular access: R tunneled subclavian catheter. Clean, dry dressing overlaying skin entry. She states this has been there for about 1.5 years. 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 Reviewed venography results from 07/31/2023: axillary, subclavian, innominate veins patent, SVC appears patent. F/u in 1 month with [facility] to discuss possible AVG placement. Not planning for line holiday at this time. S/p TDC exchange in IR 3/17.   # E. coli bacteremia and UTI: On meropenem. HDS. Urine culture and blood cultures positive for e.coli, see above.   # Paroxysmal atrial fibrillation: Rate controlled. On home amiodarone, eliquis. Per primary.   #Dispo: Okay for discharge today from a renal standpoint.   Salome Holmes, NP [facility] 08/13/2023,2:27 PM  LOS: 7 days

## 2023-08-13 NOTE — Plan of Care (Signed)
  Problem: Skin Integrity: Goal: Risk for impaired skin integrity will decrease Outcome: Progressing   Problem: Nutritional: Goal: Maintenance of adequate nutrition will improve Outcome: Progressing   Problem: Tissue Perfusion: Goal: Adequacy of tissue perfusion will improve Outcome: Progressing   Problem: Coping: Goal: Level of anxiety will decrease Outcome: Progressing   Problem: Safety: Goal: Ability to remain free from injury will improve Outcome: Progressing

## 2023-08-14 ENCOUNTER — Other Ambulatory Visit: Payer: Self-pay | Admitting: Family Medicine

## 2023-08-14 ENCOUNTER — Telehealth: Payer: Self-pay

## 2023-08-14 ENCOUNTER — Telehealth: Payer: Self-pay | Admitting: Family Medicine

## 2023-08-14 ENCOUNTER — Other Ambulatory Visit: Payer: Self-pay

## 2023-08-14 ENCOUNTER — Telehealth: Payer: Self-pay | Admitting: Nephrology

## 2023-08-14 ENCOUNTER — Telehealth: Payer: Self-pay | Admitting: *Deleted

## 2023-08-14 ENCOUNTER — Ambulatory Visit: Admitting: Family Medicine

## 2023-08-14 DIAGNOSIS — N186 End stage renal disease: Secondary | ICD-10-CM

## 2023-08-14 DIAGNOSIS — N184 Chronic kidney disease, stage 4 (severe): Secondary | ICD-10-CM

## 2023-08-14 DIAGNOSIS — Z89511 Acquired absence of right leg below knee: Secondary | ICD-10-CM

## 2023-08-14 DIAGNOSIS — F339 Major depressive disorder, recurrent, unspecified: Secondary | ICD-10-CM

## 2023-08-14 DIAGNOSIS — I83029 Varicose veins of left lower extremity with ulcer of unspecified site: Secondary | ICD-10-CM

## 2023-08-14 DIAGNOSIS — E039 Hypothyroidism, unspecified: Secondary | ICD-10-CM

## 2023-08-14 DIAGNOSIS — M75101 Unspecified rotator cuff tear or rupture of right shoulder, not specified as traumatic: Secondary | ICD-10-CM

## 2023-08-14 DIAGNOSIS — J209 Acute bronchitis, unspecified: Secondary | ICD-10-CM

## 2023-08-14 DIAGNOSIS — I5032 Chronic diastolic (congestive) heart failure: Secondary | ICD-10-CM

## 2023-08-14 DIAGNOSIS — S88111D Complete traumatic amputation at level between knee and ankle, right lower leg, subsequent encounter: Secondary | ICD-10-CM

## 2023-08-14 DIAGNOSIS — I10 Essential (primary) hypertension: Secondary | ICD-10-CM

## 2023-08-14 DIAGNOSIS — K219 Gastro-esophageal reflux disease without esophagitis: Secondary | ICD-10-CM

## 2023-08-14 DIAGNOSIS — I739 Peripheral vascular disease, unspecified: Secondary | ICD-10-CM

## 2023-08-14 DIAGNOSIS — L97929 Non-pressure chronic ulcer of unspecified part of left lower leg with unspecified severity: Secondary | ICD-10-CM

## 2023-08-14 DIAGNOSIS — E1142 Type 2 diabetes mellitus with diabetic polyneuropathy: Secondary | ICD-10-CM

## 2023-08-14 DIAGNOSIS — E782 Mixed hyperlipidemia: Secondary | ICD-10-CM

## 2023-08-14 DIAGNOSIS — E1165 Type 2 diabetes mellitus with hyperglycemia: Secondary | ICD-10-CM

## 2023-08-14 DIAGNOSIS — R9431 Abnormal electrocardiogram [ECG] [EKG]: Secondary | ICD-10-CM

## 2023-08-14 MED ORDER — ALBUTEROL SULFATE HFA 108 (90 BASE) MCG/ACT IN AERS: 1.0000 | INHALATION_SPRAY | Freq: Four times a day (QID) | RESPIRATORY_TRACT | 3 refills | Status: DC | PRN

## 2023-08-14 MED ORDER — CLONAZEPAM 0.5 MG PO TBDP
0.5000 mg | ORAL_TABLET | Freq: Two times a day (BID) | ORAL | 1 refills | Status: DC
Start: 2023-08-14 — End: 2023-08-28

## 2023-08-14 MED ORDER — LEVOTHYROXINE SODIUM 50 MCG PO TABS: 50.0000 ug | ORAL_TABLET | Freq: Every day | ORAL | 1 refills | Status: DC

## 2023-08-14 MED ORDER — INSULIN ASPART 100 UNIT/ML IJ SOLN: 0.0000 [IU] | Freq: Three times a day (TID) | INTRAMUSCULAR | 5 refills | Status: DC

## 2023-08-14 MED ORDER — METHOCARBAMOL 750 MG PO TABS: 750.0000 mg | ORAL_TABLET | Freq: Four times a day (QID) | ORAL | 0 refills | Status: DC | PRN

## 2023-08-14 MED ORDER — AMIODARONE HCL 200 MG PO TABS: 200.0000 mg | ORAL_TABLET | Freq: Every day | ORAL | 3 refills | Status: DC

## 2023-08-14 MED ORDER — ATORVASTATIN CALCIUM 80 MG PO TABS: 80.0000 mg | ORAL_TABLET | Freq: Every day | ORAL | 1 refills | Status: DC

## 2023-08-14 MED ORDER — VENLAFAXINE HCL ER 75 MG PO CP24: 75.0000 mg | ORAL_CAPSULE | Freq: Every day | ORAL | 1 refills | Status: DC

## 2023-08-14 MED ORDER — PANTOPRAZOLE SODIUM 40 MG PO TBEC: 40.0000 mg | DELAYED_RELEASE_TABLET | Freq: Every day | ORAL | 1 refills | Status: DC

## 2023-08-14 MED ORDER — NITROGLYCERIN 0.4 MG SL SUBL: 0.4000 mg | SUBLINGUAL_TABLET | SUBLINGUAL | 3 refills | Status: DC | PRN

## 2023-08-14 MED ORDER — APIXABAN 5 MG PO TABS: 5.0000 mg | ORAL_TABLET | Freq: Two times a day (BID) | ORAL | 1 refills | Status: DC

## 2023-08-14 MED ORDER — MIDODRINE HCL 10 MG PO TABS: 10.0000 mg | ORAL_TABLET | ORAL | 1 refills | Status: DC

## 2023-08-14 MED ORDER — INSULIN GLARGINE 100 UNIT/ML SOLOSTAR PEN: 31.0000 [IU] | PEN_INJECTOR | Freq: Every morning | SUBCUTANEOUS | 3 refills | Status: DC

## 2023-08-14 NOTE — Telephone Encounter (Signed)
 Transition of Care - Initial Contact from Inpatient Facility  Date of discharge: 08/13/23 Date of contact: 08/14/23 Method: Phone Spoke to: Patient  Patient contacted to discuss transition of care from recent inpatient hospitalization. Patient was admitted to [facility] from 3/12-3/20/25 with discharge diagnosis of E. coli UTI/bacteremia  The discharge medication list was reviewed.   Patient will return to his/her outpatient HD unit on: Saturday   No other concerns at this time.

## 2023-08-14 NOTE — Transitions of Care (Post Inpatient/ED Visit) (Signed)
   08/14/2023  Name: Susan [name] MRN: [id number] DOB: Jan 13, 1951  Today's TOC FU Call Status: Today's TOC FU Call Status:: Unsuccessful Call (1st Attempt) Unsuccessful Call (1st Attempt) Date: 08/14/23  Attempted to reach the patient regarding the most recent Inpatient/ED visit.  Follow Up Plan: Additional outreach attempts will be made to reach the patient to complete the Transitions of Care (Post Inpatient/ED visit) call.   Irving Shows [facility], BSN RN Care Manager/ Transition of Care [place]/ [facility] [phone]

## 2023-08-14 NOTE — Discharge Planning (Signed)
 [facility] Patient Discharge Orders- [facility] CLINIC: [facility]  Patient's name: Susan [name] Admit/DC Dates: 08/05/2023 - 08/13/2023  Discharge Diagnoses: Complicated ESBL E. Coli UTI - completed 7-day course IV Meropenem  Chronic hyponatremia Chronic hypotension Afib - on Eliquis  Aranesp: Given: No    Last Hgb: 9.1 PRBC's Given: No  ESA dose for discharge: Micera every 2 weeks-follow protocol IV Iron dose at discharge: N/A  Heparin change: No  EDW Change: No   [place] Change: No  Access intervention/Change: Yes Details:  -S/p TDC exchange 3/17 -venography from 07/31/2023: axillary, subclavian, innominate veins patent, SVC appears patent. F/u in 1 month with [facility] to discuss possible AVG placement per VVS notes  Calcitriol change: No  Discharge Labs: Calcium 8.6 Phosphorus 3.3 Albumin 2.0 K+ 3.5  IV Antibiotics: No -Completed 7-day course IV Meropenem for ESBL E.Coli UTI  On Coumadin?: No, on Eliquis    D/C Meds to be reconciled by nurse after every discharge.  Completed By: Salome Holmes, NP   Reviewed by: MD:______ RN_______

## 2023-08-14 NOTE — Telephone Encounter (Signed)
 Patient has called and is requesting an increase in her Lorazapam to 1 mg if possible. Pt states the 0.5 mg is not helping with her sleep or anxiety.  Pt also states she was advised to restart her Carvedilol from the hospital. Pt has been on 6.25, 12.5 and 25 mg. Pt was advised to call her PCP and ask which dose she needs to be on. Than you. Pt was discharged [facility] on 08/13/23 for Complicated UTI.

## 2023-08-15 DIAGNOSIS — D631 Anemia in chronic kidney disease: Secondary | ICD-10-CM | POA: Diagnosis not present

## 2023-08-15 DIAGNOSIS — N186 End stage renal disease: Secondary | ICD-10-CM | POA: Diagnosis not present

## 2023-08-15 DIAGNOSIS — Z992 Dependence on renal dialysis: Secondary | ICD-10-CM | POA: Diagnosis not present

## 2023-08-15 DIAGNOSIS — D689 Coagulation defect, unspecified: Secondary | ICD-10-CM | POA: Diagnosis not present

## 2023-08-15 DIAGNOSIS — N2581 Secondary hyperparathyroidism of renal origin: Secondary | ICD-10-CM | POA: Diagnosis not present

## 2023-08-16 DIAGNOSIS — I2721 Secondary pulmonary arterial hypertension: Secondary | ICD-10-CM | POA: Diagnosis not present

## 2023-08-16 DIAGNOSIS — I5022 Chronic systolic (congestive) heart failure: Secondary | ICD-10-CM | POA: Diagnosis not present

## 2023-08-17 ENCOUNTER — Encounter: Payer: Self-pay | Admitting: Physical Therapy

## 2023-08-17 ENCOUNTER — Ambulatory Visit: Payer: PPO | Admitting: Physical Therapy

## 2023-08-17 DIAGNOSIS — N2581 Secondary hyperparathyroidism of renal origin: Secondary | ICD-10-CM | POA: Diagnosis not present

## 2023-08-17 DIAGNOSIS — G546 Phantom limb syndrome with pain: Secondary | ICD-10-CM | POA: Diagnosis not present

## 2023-08-17 DIAGNOSIS — M6281 Muscle weakness (generalized): Secondary | ICD-10-CM

## 2023-08-17 DIAGNOSIS — Z7409 Other reduced mobility: Secondary | ICD-10-CM | POA: Diagnosis not present

## 2023-08-17 DIAGNOSIS — Z992 Dependence on renal dialysis: Secondary | ICD-10-CM | POA: Diagnosis not present

## 2023-08-17 DIAGNOSIS — D509 Iron deficiency anemia, unspecified: Secondary | ICD-10-CM | POA: Diagnosis not present

## 2023-08-17 DIAGNOSIS — E1122 Type 2 diabetes mellitus with diabetic chronic kidney disease: Secondary | ICD-10-CM | POA: Diagnosis not present

## 2023-08-17 DIAGNOSIS — N186 End stage renal disease: Secondary | ICD-10-CM | POA: Diagnosis not present

## 2023-08-17 DIAGNOSIS — R2689 Other abnormalities of gait and mobility: Secondary | ICD-10-CM | POA: Diagnosis not present

## 2023-08-17 DIAGNOSIS — R2681 Unsteadiness on feet: Secondary | ICD-10-CM

## 2023-08-17 DIAGNOSIS — D689 Coagulation defect, unspecified: Secondary | ICD-10-CM | POA: Diagnosis not present

## 2023-08-17 NOTE — Therapy (Cosign Needed)
 OUTPATIENT PHYSICAL THERAPY PROSTHETIC TREATMENT    Patient Name: Susan [name] MRN: [id number] DOB:02-Oct-1950, 73 y.o., female Today's Date: 08/17/2023  PCP: Donita Brooks, MD  REFERRING PROVIDER: Lorin Glass, MD   END OF SESSION:  PT End of Session - 08/17/23 0001     Visit Number 33    Number of Visits 44    Date for PT Re-Evaluation 09/10/23    Authorization Type [employer]    Progress Note Due on Visit 40    PT Start Time 1346    PT Stop Time 1430    PT Time Calculation (min) 44 min    Equipment Utilized During Treatment Gait belt    Activity Tolerance Patient tolerated treatment well;Patient limited by fatigue    Behavior During Therapy WFL for tasks assessed/performed               Past Medical History:  Diagnosis Date   Anemia    hx   Anxiety    Arthritis    "generalized" (03/15/2014)   CAD (coronary artery disease)    MI in 2000 - MI  2007 - treated bare metal stent (no nuclear since then as 9/11)   Carotid artery disease (HCC)    Chronic diastolic heart failure (HCC)    a) ECHO (08/2013) EF 55-60% and RV function nl b) RHC (08/2013) RA 4, RV 30/5/7, PA 25/10 (16), PCWP 7, Fick CO/CI 6.3/2.7, PVR 1.5 WU, PA 61 and 66%   Daily headache    "~ every other day; since I fell in June" (03/15/2014)   Depression    Diabetic retinopathy (HCC)    Dyslipidemia    ESRD (end stage renal disease) (HCC)    Dialysis on Tues Thurs Sat   Exertional shortness of breath    GERD (gastroesophageal reflux disease)    History of blood transfusion    History of kidney stones    HTN (hypertension)    Hypothyroidism    Myocardial infarction (HCC)    Obesity    Osteoarthritis    PAF (paroxysmal atrial fibrillation) (HCC)    Peripheral neuropathy    bilateral feet/hands   PONV (postoperative nausea and vomiting)    RBBB (right bundle branch block)    Old   Stroke (HCC)    mini strokes   Type II diabetes mellitus (HCC)    Type II, [name]  left upper arm. patient has omnipod insulin pump with Novolin R Insulin   Past Surgical History:  Procedure Laterality Date   A/V FISTULAGRAM Left 11/07/2022   Procedure: A/V Fistulagram;  Surgeon: Leonie Douglas, MD;  [facility]: [facility];  Service: Cardiovascular;  Laterality: Left;   ABDOMINAL HYSTERECTOMY  1980's   AMPUTATION Right 02/24/2018   Procedure: RIGHT FOOT GREAT TOE AND 2ND TOE AMPUTATION;  Surgeon: Nadara Mustard, MD;  Location: [facility] OR;  Service: Orthopedics;  Laterality: Right;   AMPUTATION Right 04/30/2018   Procedure: RIGHT TRANSMETATARSAL AMPUTATION;  Surgeon: Nadara Mustard, MD;  Location: [facility] OR;  Service: Orthopedics;  Laterality: Right;   AMPUTATION Right 05/02/2022   Procedure: RIGHT BELOW KNEE AMPUTATION;  Surgeon: Nadara Mustard, MD;  Location: [facility] OR;  Service: Orthopedics;  Laterality: Right;   APPLICATION OF WOUND VAC Right 06/13/2022   Procedure: APPLICATION OF WOUND VAC;  Surgeon: Nadara Mustard, MD;  Location: [facility] OR;  Service: Orthopedics;  Laterality: Right;   APPLICATION OF WOUND VAC Left 11/14/2022   Procedure: APPLICATION OF  WOUND VAC;  Surgeon: Nadara Mustard, MD;  Location: [facility] OR;  Service: Orthopedics;  Laterality: Left;   AV FISTULA PLACEMENT Left 04/02/2022   Procedure: LEFT ARM ARTERIOVENOUS (AV) FISTULA CREATION;  Surgeon: Nada Libman, MD;  Location: [facility] OR;  Service: Vascular;  Laterality: Left;  PERIPHERAL NERVE BLOCK   AV FISTULA PLACEMENT Right 04/27/2023   Procedure: RIGHT ARM BRACHIOBASILIC ARTERIOVENOUS (AV) FISTULA CREATION;  Surgeon: Leonie Douglas, MD;  Location: [facility] OR;  Service: Vascular;  Laterality: Right;   BASCILIC VEIN TRANSPOSITION Left 07/31/2022   Procedure: LEFT ARM SECOND STAGE BASILIC VEIN TRANSPOSITION;  Surgeon: Nada Libman, MD;  Location: [facility] OR;  Service: Vascular;  Laterality: Left;   BIOPSY  05/27/2020   Procedure: BIOPSY;  Surgeon: Lanelle Bal, DO;  Location: [facility];  Service: Endoscopy;;   CATARACT  EXTRACTION, BILATERAL Bilateral ?2013   COLONOSCOPY W/ POLYPECTOMY     COLONOSCOPY WITH PROPOFOL N/A 03/13/2019   Procedure: COLONOSCOPY WITH PROPOFOL;  Surgeon: Beverley Fiedler, MD;  Location: [facility] ENDOSCOPY;  Service: Gastroenterology;  Laterality: N/A;   CORONARY ANGIOPLASTY WITH STENT PLACEMENT  1999; 2007   "1 + 1"   ERCP N/A 02/03/2022   Procedure: ENDOSCOPIC RETROGRADE CHOLANGIOPANCREATOGRAPHY (ERCP);  Surgeon: Jeani Hawking, MD;  Location: [facility] ENDOSCOPY;  Service: Gastroenterology;  Laterality: N/A;   ESOPHAGOGASTRODUODENOSCOPY N/A 02/12/2023   Procedure: ESOPHAGOGASTRODUODENOSCOPY (EGD);  Surgeon: Jeani Hawking, MD;  Location: [facility] ENDOSCOPY;  Service: Gastroenterology;  Laterality: N/A;   ESOPHAGOGASTRODUODENOSCOPY (EGD) WITH PROPOFOL N/A 03/13/2019   Procedure: ESOPHAGOGASTRODUODENOSCOPY (EGD) WITH PROPOFOL;  Surgeon: Beverley Fiedler, MD;  Location: [facility] ENDOSCOPY;  Service: Gastroenterology;  Laterality: N/A;   ESOPHAGOGASTRODUODENOSCOPY (EGD) WITH PROPOFOL N/A 05/27/2020   Procedure: ESOPHAGOGASTRODUODENOSCOPY (EGD) WITH PROPOFOL;  Surgeon: Lanelle Bal, DO;  [facility];  Service: Endoscopy;  Laterality: N/A;   ESOPHAGOGASTRODUODENOSCOPY (EGD) WITH PROPOFOL N/A 09/03/2022   Procedure: ESOPHAGOGASTRODUODENOSCOPY (EGD) WITH PROPOFOL;  Surgeon: Jeani Hawking, MD;  Location: [name] ENDOSCOPY;  Service: Gastroenterology;  Laterality: N/A;   EYE SURGERY Bilateral    lazer   FLEXIBLE SIGMOIDOSCOPY N/A 05/23/2022   Procedure: FLEXIBLE SIGMOIDOSCOPY;  Surgeon: Jeani Hawking, MD;  Location: [facility] ENDOSCOPY;  Service: Gastroenterology;  Laterality: N/A;   FLEXIBLE SIGMOIDOSCOPY N/A 05/24/2022   Procedure: FLEXIBLE SIGMOIDOSCOPY;  Surgeon: Imogene Burn, MD;  Location: [facility] ENDOSCOPY;  Service: Gastroenterology;  Laterality: N/A;   FLEXIBLE SIGMOIDOSCOPY N/A 09/03/2022   Procedure: FLEXIBLE SIGMOIDOSCOPY;  Surgeon: Jeani Hawking, MD;  Location: [facility] ENDOSCOPY;  Service: Gastroenterology;  Laterality:  N/A;   HEMOSTASIS CLIP PLACEMENT  03/13/2019   Procedure: HEMOSTASIS CLIP PLACEMENT;  Surgeon: Beverley Fiedler, MD;  Location: [facility] ENDOSCOPY;  Service: Gastroenterology;;   HEMOSTASIS CLIP PLACEMENT  05/23/2022   Procedure: HEMOSTASIS CLIP PLACEMENT;  Surgeon: Jeani Hawking, MD;  Location: [facility] ENDOSCOPY;  Service: Gastroenterology;;   HEMOSTASIS CONTROL  05/24/2022   Procedure: HEMOSTASIS CONTROL;  Surgeon: Imogene Burn, MD;  Location: [facility] ENDOSCOPY;  Service: Gastroenterology;;   HOT HEMOSTASIS N/A 05/23/2022   Procedure: HOT HEMOSTASIS (ARGON PLASMA COAGULATION/BICAP);  Surgeon: Jeani Hawking, MD;  Location: [facility] ENDOSCOPY;  Service: Gastroenterology;  Laterality: N/A;   I & D EXTREMITY Left 05/05/2022   Procedure: IRRIGATION AND DEBRIDEMENT LEFT ARM AV FISTULA;  Surgeon: Cephus Shelling, MD;  Location: [facility] OR;  Service: Vascular;  Laterality: Left;   I & D EXTREMITY N/A 11/14/2022   Procedure: IRRIGATION AND DEBRIDEMENT OF LOWER EXTREMITY WOUND;  Surgeon: Nadara Mustard, MD;  Location:  [facility] OR;  Service: Orthopedics;  Laterality: N/A;   INSERTION OF DIALYSIS CATHETER Right 04/02/2022   Procedure: INSERTION OF TUNNELED DIALYSIS CATHETER;  Surgeon: Nada Libman, MD;  Location: [facility] OR;  Service: Vascular;  Laterality: Right;   IR FLUORO GUIDE CV LINE RIGHT  08/10/2023   IR VENOCAVAGRAM IVC  08/10/2023   KNEE ARTHROSCOPY Left 10/25/2006   POLYPECTOMY  03/13/2019   Procedure: POLYPECTOMY;  Surgeon: Beverley Fiedler, MD;  Location: [facility] ENDOSCOPY;  Service: Gastroenterology;;   REMOVAL OF STONES  02/03/2022   Procedure: REMOVAL OF STONES;  Surgeon: Jeani Hawking, MD;  Location: [name] ENDOSCOPY;  Service: Gastroenterology;;   REVISON OF ARTERIOVENOUS FISTULA Left 08/20/2022   Procedure: REVISON OF LEFT ARM ARTERIOVENOUS FISTULA;  Surgeon: Nada Libman, MD;  [facility] OR;  Service: Vascular;  Laterality: Left;   RIGHT HEART CATH N/A 07/24/2017   Procedure: RIGHT HEART CATH;  Surgeon: Dolores Patty,  MD;  [facility];  Service: Cardiovascular;  Laterality: N/A;   RIGHT HEART CATHETERIZATION N/A 09/22/2013   Procedure: RIGHT HEART CATH;  Surgeon: Dolores Patty, MD;  Location: [facility] CATH LAB;  Service: Cardiovascular;  Laterality: N/A;   SHOULDER ARTHROSCOPY WITH OPEN ROTATOR CUFF REPAIR Right 03/14/2014   Procedure: RIGHT SHOULDER ARTHROSCOPY WITH BICEPS RELEASE, OPEN SUBSCAPULA REPAIR, OPEN SUPRASPINATUS REPAIR.;  Surgeon: Cammy Copa, MD;  Location: [facility] OR;  Service: Orthopedics;  Laterality: Right;   SPHINCTEROTOMY  02/03/2022   Procedure: SPHINCTEROTOMY;  Surgeon: Jeani Hawking, MD;  Location: [facility] ENDOSCOPY;  Service: Gastroenterology;;   STUMP REVISION Right 06/13/2022   Procedure: REVISION RIGHT BELOW KNEE AMPUTATION;  Surgeon: Nadara Mustard, MD;  Location: [facility] OR;  Service: Orthopedics;  Laterality: Right;   TEE WITHOUT CARDIOVERSION N/A 02/04/2022   Procedure: TRANSESOPHAGEAL ECHOCARDIOGRAM (TEE);  Surgeon: Dolores Patty, MD;  Location: [facility] ENDOSCOPY;  Service: Cardiovascular;  Laterality: N/A;   THROMBECTOMY W/ EMBOLECTOMY Left 08/20/2022   Procedure: THROMBECTOMY OF LEFT ARM ARTERIOVENOUS FISTULA;  Surgeon: Nada Libman, MD;  Location: [facility] OR;  Service: Vascular;  Laterality: Left;   TOE AMPUTATION Right 02/24/2018   GREAT TOE AND 2ND TOE AMPUTATION   TUBAL LIGATION  1970's   UPPER EXTREMITY VENOGRAPHY N/A 07/31/2023   Procedure: UPPER EXTREMITY VENOGRAPHY;  Surgeon: Leonie Douglas, MD;  [facility]: [facility];  Service: Cardiovascular;  Laterality: N/A;   Patient Active Problem List   Diagnosis Date Noted   Pyelonephritis 08/06/2023   Complicated UTI (urinary tract infection) 08/05/2023   Atrial fibrillation (HCC) 06/17/2023   Atopic dermatitis 06/17/2023   Hyperosmolar hyperglycemic state (HHS) (HCC) 02/05/2023   Gastroparesis 02/05/2023   Depression 01/29/2023   Intractable nausea and vomiting 01/20/2023   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 difficile colitis 06/05/2022   Spleen hematoma 06/05/2022   Dehiscence of amputation stump of right lower extremity (HCC) 06/05/2022   Rectal ulcer 05/27/2022   ESRD (end stage renal disease) (HCC) 05/27/2022   GI bleed  05/23/2022   Difficult intravenous access 05/23/2022   Gangrene of right foot (HCC) 05/02/2022   S/P BKA (below knee amputation) unilateral, right (HCC) 05/02/2022   Unspecified protein-calorie malnutrition (HCC) 04/15/2022   Secondary hyperparathyroidism of renal origin (HCC) 04/14/2022   Coagulation defect, unspecified (HCC) 04/09/2022   Acquired absence of other left toe(s) (HCC) 04/07/2022   Allergy, unspecified, initial encounter 04/07/2022   Dependence on renal dialysis (HCC) 04/07/2022   Gout due to renal impairment, unspecified site 04/07/2022   Hypertensive heart and chronic kidney disease with heart failure and with stage 5 chronic kidney disease, or end stage renal disease (HCC) 04/07/2022   Personal history of transient ischemic attack (TIA), and cerebral infarction without residual deficits 04/07/2022   Renal osteodystrophy 04/07/2022   Venous stasis ulcer of right calf (HCC) 03/31/2022   Fistula, colovaginal 03/26/2022   Diarrhea 03/26/2022   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   Sepsis without acute organ dysfunction (HCC)    Bacteremia    Acute pancreatitis 02/01/2022   Abdominal pain  02/01/2022   SIRS (systemic inflammatory response syndrome) (HCC) 02/01/2022   Transaminitis 02/01/2022   History of anemia due to chronic kidney disease 02/01/2022   Paroxysmal atrial fibrillation (HCC) 02/01/2022   Uncontrolled type 2 diabetes mellitus with hyperglycemia, with long-term current use of insulin (HCC) 01/14/2022   NSTEMI (non-ST elevated myocardial infarction) (HCC) 03/05/2021   Acute renal failure superimposed on stage 4 chronic kidney disease (HCC) 08/22/2020   Hypoalbuminemia 05/25/2020   GERD (gastroesophageal reflux disease) 05/25/2020   Pressure injury of skin 05/17/2020   Acute on chronic combined systolic and diastolic congestive heart failure (HCC) 03/07/2020   Type 2 diabetes mellitus with diabetic polyneuropathy, with long-term current use of insulin (HCC) 03/07/2020   Obesity, Class III, BMI 40-49.9 (morbid obesity) (HCC) 03/07/2020   Common bile duct (CBD) obstruction 05/28/2019   Benign neoplasm of ascending colon    Benign neoplasm of transverse colon    Benign neoplasm of descending colon    Benign neoplasm of sigmoid colon    Gastric polyps    Hyperkalemia 03/11/2019   Prolonged QT interval 03/11/2019   Acute blood loss anemia 03/11/2019   Onychomycosis 06/21/2018   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)    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 (HCC)    PVD (peripheral vascular disease) (HCC) 10/26/2017   E-coli UTI 07/27/2017   Hypothyroidism 07/27/2017   AKI (acute kidney injury) (HCC)    PAH (pulmonary artery hypertension) (HCC)    Impaired ambulation 07/19/2017   Leg cramps 02/27/2017   Peripheral edema 01/12/2017   Diabetic neuropathy (HCC) 11/12/2016   Anemia of chronic disease 10/03/2015   Historical diagnosis of generalized anxiety disorder 10/03/2015   Secondary insomnia 10/03/2015   Acute bronchitis 09/05/2015   Hyperglycemia due to diabetes mellitus (HCC) 06/07/2015   Non compliance with medical  treatment 04/17/2014   Rotator cuff tear 03/14/2014   Obesity 09/23/2013   Chronic HFrEF (heart failure with reduced ejection fraction) (HCC) 06/03/2013   Hypotension 12/25/2012   Hypokalemia 12/25/2012   Hyponatremia 12/25/2012   Urinary incontinence    MDD (major depressive disorder), recurrent episode, moderate (HCC) 11/12/2010   RBBB (right bundle branch block)    Wide-complex tachycardia    Coronary artery disease    Hyperlipemia 01/22/2009   Chronic hypotension 01/22/2009    ONSET DATE: 03/09/2023 MD referral to PT  REFERRING DIAG: S88.111D (ICD-10-CM) - Below-knee amputation of right lower extremity, subsequent encounter  THERAPY DIAG:  Unsteadiness on feet  Impaired functional mobility, balance, gait, and endurance  Muscle weakness (generalized)  Other abnormalities of gait and mobility  Phantom limb syndrome with pain (HCC)  Rationale for Evaluation and Treatment: Rehabilitation  SUBJECTIVE:  SUBJECTIVE STATEMENT: Patient states that she is nervous about getting up today since she had a scare in the hospital with almost falling with nursing staff. Patient is going to have fistula assessment Friday. Is doing much better than last session in the clinic however has not been up for 8 days.    PERTINENT HISTORY: Right TTA 05/02/22, Lymphedema, idiopathic chronic venous HTN with ulcer, depression, colitis, diverticulitis, ESRD, gout, NSTEMI, DM2, polyneuropathy, PVD, CAD, right bundle branch block, mini strokes  PAIN:  Are you having pain? Back pain  PRECAUTIONS: Fall and Other: No BP LUE  WEIGHT BEARING RESTRICTIONS: No  FALLS: Has patient fallen in last 6 months? No  LIVING ENVIRONMENT: Lives with: lives with their spouse and 2 small dogs Lives in: mobile home Home Access: Ramped entrance Home layout: One level Stairs: Yes: External: 6-7 steps; can reach both Has following equipment at home: Single point cane, Environmental consultant - 2 wheeled, Environmental consultant - 4 wheeled,  Wheelchair (manual), [employer], Grab bars, and Ramped entry  OCCUPATION:  retired  PLOF: prior to amputation for ~year used RW in home & community  PATIENT GOALS:  to use prosthesis to walk, get out w/c in & out of car, get to bathroom.   OBJECTIVE:  COGNITION: Overall cognitive status:  Eval on 03/23/2023:   Within functional limits for tasks assessed  POSTURE:  Eval on 03/23/2023:  rounded shoulders, forward head, increased thoracic kyphosis, flexed trunk , and weight shift left  LOWER EXTREMITY ROM: ROM Right eval Left eval  Hip flexion    Hip extension    Hip abduction    Hip adduction    Hip internal rotation    Hip external rotation    Knee flexion    Knee extension    Ankle dorsiflexion    Ankle plantarflexion    Ankle inversion    Ankle eversion     (Blank rows = not tested)  LOWER EXTREMITY MMT:  MMT Right eval Left eval  Hip flexion 3-/5 3-/5  Hip extension 2/5 2/5  Hip abduction 2+/5 2+/5  Hip adduction    Hip internal rotation    Hip external rotation    Knee flexion 3-/5 3-/5  Knee extension 3-/5 3-/5  Ankle dorsiflexion    Ankle plantarflexion    Ankle inversion    Ankle eversion    At Evaluation all strength testing is grossly seated and functionally standing / gait. (Blank rows = not tested)  TRANSFERS: 07/27/2023: -Sit>stand from wc with RW: MinA with heavy vc for sequencing and offweighting LE -Stand>sit wc with RW: CGA with patient performing safe controlled descent  06/15/2023: -Sit to stand transfers with RW wheelchair<>mat table at varied height: 19in with maxA to rise to stand; 22in ModA rise to stand, 24in min-modA rise to stand. -stand pivot transfer with RW w/c to mat table with modA once standing.  -Lateral scooting wc<>table at level height SBA; vc for LE positioning for optimal use of LE during transfer -lateral scoot R: modA due to fatigue and uneven surface transfer from low surface to higher surface   05/04/2023: Squat  pivot transfers with modA.  04/21/2023: Pt sit to stand w/c to //bar with one hand pushing on w/c and other hand on //bar with  modA first time & MinA 2nd/ 3rd time.  Best is RUE on //bar & LUE on w/c.  Stand to sit reaching LUE to w/c with minA to control descent.   PT demo & verbal cues on sit to/from stand w/c to RW.  1st time modA 2 person. 2nd time maxA one person.  Stand to sit with minA and constant cueing. Scooting transfers with minA with w/c & mat direct contact & same ht.   Eval on 03/23/2023:  Sit to stand: Max A (two-person assist) from 22" w/c to rolling walker Stand to sit: Max A (two-person assist) rolling walker to wheelchair  FUNCTIONAL TESTs:   07/27/2023: Patient stood with RW SBA for 54sec before c/o lightheadedness and sitting down to toilet. Resisted nudges applied posteriorly to simulate hygienic cleaning. VC for weight shifts when LE become tired.  BP: after standing 91min54sec 161/82 HR 92  06/15/2023: -Static stance for standing with RW; requiring Mod-MaxA for rise to stand d/t patient unsteady on feet posterior lean without proper righting strategy, SBA for stand   04/21/2023: Pt able to stand 60 sec with BUE on //bars with supervision.  Pt able to stand for 1 min with RW support with minA.  Eval on 03/23/2023: Patient maintained upright holding rolling walker with mod assist 2 people for safety for 30 seconds.  GAIT: 07/27/2023: Patient ambulated 68ft with narrow bathroom entry and 90deg turn to sit on commode CGA with minA and heavy vc for progression and sequencing of RW. While turning to sit on commode patient lost balance laterally requiring mod-maxA from therapist and patient use of grab bar to regain balance.   06/17/2023: Patient ambulated 31ft (maximal tolerated distance) with RW ModA (2 person for safety). Required VC for LE sequencing and minA to steer walker and prevent tip over.   04/21/2023: Pt amb 5' in //bars with modA (2 person for  safety) with PT verbal & tactile cues on sequence and weight shift.  Pt amb 10' with RW with maxA (2 person for safety) with PT verbal & tactile cues on sequence and weight shift.  Eval on 03/23/2023:  Patient took 2 steps (one per LE) with +2 max assist with rolling walker and TTA prosthesis.  Patient adducting prosthesis stepping on left foot with no awareness.  CURRENT PROSTHETIC WEAR ASSESSMENT: 06/17/2023:  Independence with prosthetic care:  Patient and husband are independent with: skin check, prosthetic cleaning, ply sock cleaning, proper wear schedule/adjustment Patient is dependent with: residual limb care, correct ply sock adjustment, and proper weight-bearing schedule/adjustment Donning prosthesis: husband is independent with proper donning. Patient requires MaxA. Patient can verbally direct someone how to properly assist. Doffing prosthesis: Husband is independent with proper donning. Patient requires modA. Patient can verbally direct someone how to properly assist. Prosthetic wear tolerance: majority of awake hours including dialysis hours, 1x/day, 3-4 days/week Prosthetic weight bearing tolerance: able to tolerate of standing without complains of prosthetic limb pain. Time not limited by limb discomfort but limited by muscle fatigue.   Eval on 03/23/2023:   Patient is dependent with: skin check, residual limb care, prosthetic cleaning, ply sock cleaning, correct ply sock adjustment, proper wear schedule/adjustment, and proper weight-bearing schedule/adjustment Donning prosthesis: Max A Doffing prosthesis: Min A Prosthetic wear tolerance: 2-6 hours, 1x/day, 3-4 days/week Prosthetic weight bearing tolerance: 3 minutes with limb pain Edema: pitting edema Residual limb condition: No open areas, dry skin, normal color and temperature, cylindrical shape Prosthetic description: Silicone liner with pin lock  suspension, total contact socket with flexible inner socket, sach  foot    TODAY'S TREATMENT:                                                                                                             DATE: 08/17/2023 Prosthetic training: -Patient performed sit>stand from [facility] with RW ModA requiring vc for sequencing of LE to rise to stand, stand>sit MinA with vc for sequencing -Stood CGA with intermittent close SBA for performing side to side shifting with RW and vc -Stand pivot Lt with RW ModA for stand, ModA for transfer, CGA for stand>sit -Stand, ambulate with RW15ft CGA with heavy vc 90deg turn Rt to sit in WC. VC for proper sequencing of rw and LE to prevent falls  -patients prosthesis became twisted, patient was able to instruct therapist on proper sequencing to take prosthesis on and off without cuing with accuracy. Educated on proper fit of prosthesis with sock wear and visualization of knee cap. Patient and husband verbalized understanding however will require reeducation.    TREATMENT:                                                                                                                            DATE: 08/05/2023 Upon arrival patient and husband stated that she was not feeling well today with patient describing back pain, abdominal pain, pain/burning with urinating, and is lethargic at times only aroused when tactile stimulation is applied.  Patient wanted to participate with physical therapy, once gait belt was applied therapist felt the patient was sweating though she had been sitting in the [facility] for almost an hr before starting therapy session. Pt temperature was taken reading 96.4 however patient had red cheeks, sweating, and increased lethargy. She reported taking tylenol prior to coming to PT.  Patient initially did not tell us that she was advised yesterday at dialysis that she should go to the ED due to symptoms. Patient instructed on urgency of receiving medical treatment to prevent further progression of possible infection. Patient and  husband agreeable to going directly to ED at termination of session.  And end of session patient was not oriented to day, requiring 3 cues to become oriented.   TREATMENT:  DATE: 08/03/2023 Prosthetic training: -Patient ambulated 20ft around 1 obstacle + 64ft weaving between 2 obstacles to 90deg turn to sit in chair with use of RW CGA with intermittent close SBA, 1 instance of L lateral LOB with patient able to rectify under own power without therapist intervention- R foot crossed and stepped on L foot. With fatigue patient required CGA throughout as well as increased time for motor planning and sequencing.  -Patients prosthesis became twisted to the R, patient asked to rectify requiring verbal cuing and leading questions for patient to properly verbalize sequencing and putting on of prosthetic leg. Able to press pin release independently with 2 attempts, verbalized key points for lining up leg correctly before securing into socket.  -patient and husband encouraged to continue to practice patient don/doff leg, ensuring clean liner with application of cortizone to sooth itchiness, as well as to continue HEP for core and back strengthening.  Self care:  -STSx3 with LE walk back; 1st bout ModA with heavy vc for sequencing 2nd and 3rd bout ModA without vc as patient was able to sequence LE without cuing.  -Stood with RW accepting posterior pertubation's applied at the hip to simulate hygenic cleaning; 1st bout 73min15sec 2nd bout 86min3sec.    TREATMENT:                                                                                                                            DATE: 07/29/2023 Prosthetic training: Patient ambulated 88ft with use of RW CGA with instances of close SBA, with 2 180deg turns to sit in chair. Turns required mod-maxA with heavy cuing to prevent fall posteriorly.  On second turn patient required therapist intervention to prevent fall. Patient required maxAx2 to rise from low chair without armrest however was able to lower into chair with use of RW with MinA. Patient able to rise to stand with RW and chair with armrest with Mod-minA with vc for cues to weight shift and offload LE in order to progress rise to standing. VC for sequencing of feet and remaining close to seated surface on approach to reduce amount of effort to take steps bwds  Theract: Seated trunk flexion/extension, L lateral and R lateral x5ea with vc for use of core and back strength, then use of 5lb weight in all directions Seated trunk rotation R and L with green tband resistance x5ea  Patient educated on performing core and back strengthening activities in order to help with balance and strength when walking upright in walker and preventing falls. Verbalized understanding.  TREATMENT:  DATE: 07/27/2023 -Patient ambulated 50ft with narrow bathroom entry and 90deg turn to sit on commode CGA with minA and heavy vc for progression and sequencing of RW. While turning to sit on commode patient lost balance laterally requiring mod-maxA from therapist and patient use of grab bar to regain balance.  -Stand>sit 18in toilet: modA due to low height and only 1 grab bar -Sit>stand 18in toilet: Mod-maxA with use of grab bar, wheelchair armrest (simulate 1 of armrests on raised toilet seat), and RW.  -Patient stood with RW SBA for 54sec before c/o lightheadedness and sitting down to toilet. Resisted nudges applied posteriorly to simulate hygienic cleaning. VC for weight shifts when LE become tired.  BP: after standing 76min54sec 161/82 HR 92 -Sit>stand from wc with RW: MinA with heavy vc for sequencing and offweighting LE -Stand>sit wc with RW: CGA with patient performing safe controlled  descent -Patient ambulated 74ft CGA to wheelchair and required MaxA when turning due to posterior LOB requiring therapist assist to prevent fall   PATIENT EDUCATION: PATIENT EDUCATED ON FOLLOWING PROSTHETIC CARE: Education details: Use of stockinette under proximal liner to decrease itching sensation, no wounds presents to PT recommended not using Vive wear under liner Skin check, Prosthetic cleaning, Propper donning, and Proper wear schedule/adjustment Prosthetic wear tolerance: 3 hours 2x/day, 4 days/week Person educated: Patient and Spouse Education method: Explanation, Tactile cues, and Verbal cues Education comprehension: verbalized understanding, verbal cues required, tactile cues required, and needs further education  HOME EXERCISE PROGRAM: Access Code: [email or web address] URL: [email or web address] Date: 04/15/2023 Prepared by: Vladimir Faster  Exercises - Supine Bridge  - 1 x daily - 4 x weekly - 2 sets - 5 reps - 2 seconds hold - Supine Lower Trunk Rotation  - 1 x daily - 4 x weekly - 2 sets - 5 reps - 5 seconds hold - Supine Heel Slide with Strap  - 1 x daily - 4 x weekly - 2 sets - 5 reps - 2-3 seconds hold - Supine Hip Abduction  - 1 x daily - 4 x weekly - 2 sets - 5 reps - 2-3 seconds hold - Seated Hip Flexion Toward Target  - 1 x daily - 4 x weekly - 2 sets - 5 reps - 5 seconds hold - Seated Eccentric Abdominal Lean Back  - 1 x daily - 4 x weekly - 2 sets - 5 reps - 5 seconds hold - Seated Sidebending  - 1 x daily - 4 x weekly - 2 sets - 5 reps - 5 seconds hold - Seated Trunk Rotation with Crossed Arms  - 1 x daily - 4 x weekly - 2 sets - 5 reps - 5 seconds hold   ASSESSMENT:  CLINICAL IMPRESSION:  Patient did well with activity today despite initial hesitancy  to stand due to fear of falling. Patient was able to progress to ambulation with turns requiring modA for stand and heavy vc however was able to ambulate well as she required CGA and increased time. Patient will  benefit from continued skilled physical therapy to address deficits and improve overall function.     OBJECTIVE IMPAIRMENTS: Abnormal gait, decreased activity tolerance, decreased balance, decreased endurance, decreased knowledge of condition, decreased knowledge of use of DME, decreased mobility, difficulty walking, decreased ROM, decreased strength, decreased safety awareness, postural dysfunction, prosthetic dependency , and pain.   ACTIVITY LIMITATIONS: standing, transfers, and locomotion level  PARTICIPATION LIMITATIONS: community activity, household mobility and dependency / burden of care on family  PERSONAL  FACTORS: Age, Fitness, Past/current experiences, Time since onset of injury/illness/exacerbation, and 3+ comorbidities: see PMH  are also affecting patient's functional outcome.   REHAB POTENTIAL: Good  CLINICAL DECISION MAKING: Evolving/moderate complexity  EVALUATION COMPLEXITY: Moderate   GOALS: Goals reviewed with patient? Yes  SHORT TERM GOALS: Target date: 07/15/2023:  Patient & husband verbalize proper residual limb care and understanding for adjusting ply socks.  Baseline: SEE OBJECTIVE DATA Goal status:   MET 07/27/2023 2.  Patient able to sit to stand w/c to rolling walker with 1 person modA and maintain upright with RW for 4 min with SBA to assist with toileting needs/hygienic cleaning.  Baseline: SEE OBJECTIVE DATA Goal status: partially met 07/08/2023  3. Patient ambulates 6ft including turning 90* to position to sit with RW & prosthesis with modA to navigate reaching bathroom in home.  Baseline: SEE OBJECTIVE DATA Goal status: partially met 07/08/2023   LONG TERM GOALS: Target date: 09/10/2023  Patient & husband demonstrates & verbalizes understanding of prosthetic care to enable safe utilization of prosthesis. Baseline: SEE OBJECTIVE DATA Goal status: ongoing 07/27/2023  Patient tolerates prosthesis wear >80% of awake hours on non-dialysis days and >50% on  dialysis days without skin issues and limb pain </= 4/10. Baseline: SEE OBJECTIVE DATA Goal status: MET 06/15/2023  Scooting transfers with minA and sit to/from stand transfers with modA.  Baseline: SEE OBJECTIVE DATA Goal status: ongoing  07/27/2023  Patient ambulates >59ft including turning 90* to position to sit with prosthesis & RW with modA for household navigation.  Baseline: SEE OBJECTIVE DATA Goal status: ongoing  07/27/2023  Patient able to maintain static stance with RW support with SBA for 5 min to assist with hygienic cleaning.  Baseline: SEE OBJECTIVE DATA Goal status:  ongoing  07/27/2023   PLAN:  PT FREQUENCY: 2x/week  PT DURATION: 12 weeks  PLANNED INTERVENTIONS: 97164- PT Re-evaluation, 97110-Therapeutic exercises, 97530- Therapeutic activity, 97112- Neuromuscular re-education, [phone]- Self Care, 60454- Gait training, [phone]- Prosthetic training, Patient/Family education, Balance training, DME instructions, Therapeutic exercises, Therapeutic activity, Neuromuscular re-education, Gait training, and Self Care  PLAN FOR NEXT SESSION:  continue standing tolerance and ambulation with turn 90deg to sit, seated core strengthening with resistance bands.     Yolande Skoda, Krystal Teachey, Student-PT 08/17/2023, 2:36 PM

## 2023-08-18 ENCOUNTER — Telehealth: Payer: Self-pay | Admitting: Family Medicine

## 2023-08-18 ENCOUNTER — Telehealth: Payer: Self-pay | Admitting: *Deleted

## 2023-08-18 NOTE — Telephone Encounter (Signed)
 This medication was sent to the pharmacy on 08/15/23.

## 2023-08-18 NOTE — Transitions of Care (Post Inpatient/ED Visit) (Signed)
   08/18/2023  Name: Susan [name] MRN: [id number] DOB: [birth year]  Today's TOC FU Call Status: Today's TOC FU Call Status:: Unsuccessful Call (2nd Attempt) Unsuccessful Call (2nd Attempt) Date: 08/18/23  Attempted to reach the patient regarding the most recent Inpatient/ED visit.  Follow Up Plan: Additional outreach attempts will be made to reach the patient to complete the Transitions of Care (Post Inpatient/ED visit) call.   Gean Maidens BSN RN [facility] Health Care Management Coordinator [email or web address] Direct Dial: [phone]  Fax: [phone] Website: [email or web address]

## 2023-08-18 NOTE — Telephone Encounter (Signed)
 Prescription Request  08/18/2023  LOV: 07/06/2023  What is the name of the medication or equipment? midodrine (PROAMATINE) 10 MG tablet   Have you contacted your pharmacy to request a refill? Yes   Which pharmacy would you like this sent to?  [id number] [place], [place] - [address] [place] [address] Phone: [phone] Fax: [phone]    Patient notified that their request is being sent to the clinical staff for review and that they should receive a response within 2 business days.   Please advise at [facility] [phone]

## 2023-08-19 ENCOUNTER — Ambulatory Visit: Payer: PPO | Admitting: Physical Therapy

## 2023-08-19 ENCOUNTER — Encounter: Payer: Self-pay | Admitting: Physical Therapy

## 2023-08-19 DIAGNOSIS — R2689 Other abnormalities of gait and mobility: Secondary | ICD-10-CM

## 2023-08-19 DIAGNOSIS — Z7409 Other reduced mobility: Secondary | ICD-10-CM

## 2023-08-19 DIAGNOSIS — R2681 Unsteadiness on feet: Secondary | ICD-10-CM

## 2023-08-19 DIAGNOSIS — Z89511 Acquired absence of right leg below knee: Secondary | ICD-10-CM

## 2023-08-19 DIAGNOSIS — G546 Phantom limb syndrome with pain: Secondary | ICD-10-CM | POA: Diagnosis not present

## 2023-08-19 DIAGNOSIS — M6281 Muscle weakness (generalized): Secondary | ICD-10-CM | POA: Diagnosis not present

## 2023-08-19 NOTE — Therapy (Cosign Needed)
 OUTPATIENT PHYSICAL THERAPY PROSTHETIC TREATMENT    Patient Name: Susan [name] MRN: [id number] DOB:1950-10-24, 73 y.o., female Today's Date: 08/20/2023  PCP: Donita Brooks, MD  REFERRING PROVIDER:  Aldean Baker, MD  END OF SESSION:  PT End of Session - 08/19/23 1343     Visit Number 34    Number of Visits 44    Date for PT Re-Evaluation 09/10/23    Authorization Type [employer]    Progress Note Due on Visit 40    PT Start Time 1343    PT Stop Time 1430    PT Time Calculation (min) 47 min    Equipment Utilized During Treatment Gait belt    Activity Tolerance Patient tolerated treatment well;Patient limited by fatigue    Behavior During Therapy WFL for tasks assessed/performed                Past Medical History:  Diagnosis Date   Anemia    hx   Anxiety    Arthritis    "generalized" (03/15/2014)   CAD (coronary artery disease)    MI in 2000 - MI  2007 - treated bare metal stent (no nuclear since then as 9/11)   Carotid artery disease (HCC)    Chronic diastolic heart failure (HCC)    a) ECHO (08/2013) EF 55-60% and RV function nl b) RHC (08/2013) RA 4, RV 30/5/7, PA 25/10 (16), PCWP 7, Fick CO/CI 6.3/2.7, PVR 1.5 WU, PA 61 and 66%   Daily headache    "~ every other day; since I fell in June" (03/15/2014)   Depression    Diabetic retinopathy (HCC)    Dyslipidemia    ESRD (end stage renal disease) (HCC)    Dialysis on Tues Thurs Sat   Exertional shortness of breath    GERD (gastroesophageal reflux disease)    History of blood transfusion    History of kidney stones    HTN (hypertension)    Hypothyroidism    Myocardial infarction (HCC)    Obesity    Osteoarthritis    PAF (paroxysmal atrial fibrillation) (HCC)    Peripheral neuropathy    bilateral feet/hands   PONV (postoperative nausea and vomiting)    RBBB (right bundle branch block)    Old   Stroke (HCC)    mini strokes   Type II diabetes mellitus (HCC)    Type II, [name]  left upper arm. patient has omnipod insulin pump with Novolin R Insulin   Past Surgical History:  Procedure Laterality Date   A/V FISTULAGRAM Left 11/07/2022   Procedure: A/V Fistulagram;  Surgeon: Leonie Douglas, MD;  [facility]: [facility];  Service: Cardiovascular;  Laterality: Left;   ABDOMINAL HYSTERECTOMY  1980's   AMPUTATION Right 02/24/2018   Procedure: RIGHT FOOT GREAT TOE AND 2ND TOE AMPUTATION;  Surgeon: Nadara Mustard, MD;  Location: [facility] OR;  Service: Orthopedics;  Laterality: Right;   AMPUTATION Right 04/30/2018   Procedure: RIGHT TRANSMETATARSAL AMPUTATION;  Surgeon: Nadara Mustard, MD;  Location: [facility] OR;  Service: Orthopedics;  Laterality: Right;   AMPUTATION Right 05/02/2022   Procedure: RIGHT BELOW KNEE AMPUTATION;  Surgeon: Nadara Mustard, MD;  Location: [facility] OR;  Service: Orthopedics;  Laterality: Right;   APPLICATION OF WOUND VAC Right 06/13/2022   Procedure: APPLICATION OF WOUND VAC;  Surgeon: Nadara Mustard, MD;  Location: [facility] OR;  Service: Orthopedics;  Laterality: Right;   APPLICATION OF WOUND VAC Left 11/14/2022   Procedure: APPLICATION  OF WOUND VAC;  Surgeon: Nadara Mustard, MD;  Location: [facility] OR;  Service: Orthopedics;  Laterality: Left;   AV FISTULA PLACEMENT Left 04/02/2022   Procedure: LEFT ARM ARTERIOVENOUS (AV) FISTULA CREATION;  Surgeon: Nada Libman, MD;  Location: [facility] OR;  Service: Vascular;  Laterality: Left;  PERIPHERAL NERVE BLOCK   AV FISTULA PLACEMENT Right 04/27/2023   Procedure: RIGHT ARM BRACHIOBASILIC ARTERIOVENOUS (AV) FISTULA CREATION;  Surgeon: Leonie Douglas, MD;  Location: [facility];  Service: Vascular;  Laterality: Right;   BASCILIC VEIN TRANSPOSITION Left 07/31/2022   Procedure: LEFT ARM SECOND STAGE BASILIC VEIN TRANSPOSITION;  Surgeon: Nada Libman, MD;  [facility]: [facility];  Service: Vascular;  Laterality: Left;   BIOPSY  05/27/2020   Procedure: BIOPSY;  Surgeon: Lanelle Bal, DO;  Location: [facility];  Service: Endoscopy;;   CATARACT  EXTRACTION, BILATERAL Bilateral ?2013   COLONOSCOPY W/ POLYPECTOMY     COLONOSCOPY WITH PROPOFOL N/A 03/13/2019   Procedure: COLONOSCOPY WITH PROPOFOL;  Surgeon: Beverley Fiedler, MD;  Location: [facility] ENDOSCOPY;  Service: Gastroenterology;  Laterality: N/A;   CORONARY ANGIOPLASTY WITH STENT PLACEMENT  1999; 2007   "1 + 1"   ERCP N/A 02/03/2022   Procedure: ENDOSCOPIC RETROGRADE CHOLANGIOPANCREATOGRAPHY (ERCP);  Surgeon: Jeani Hawking, MD;  Location: [facility] ENDOSCOPY;  Service: Gastroenterology;  Laterality: N/A;   ESOPHAGOGASTRODUODENOSCOPY N/A 02/12/2023   Procedure: ESOPHAGOGASTRODUODENOSCOPY (EGD);  Surgeon: Jeani Hawking, MD;  Location: [facility] ENDOSCOPY;  Service: Gastroenterology;  Laterality: N/A;   ESOPHAGOGASTRODUODENOSCOPY (EGD) WITH PROPOFOL N/A 03/13/2019   Procedure: ESOPHAGOGASTRODUODENOSCOPY (EGD) WITH PROPOFOL;  Surgeon: Beverley Fiedler, MD;  Location: [facility] ENDOSCOPY;  Service: Gastroenterology;  Laterality: N/A;   ESOPHAGOGASTRODUODENOSCOPY (EGD) WITH PROPOFOL N/A 05/27/2020   Procedure: ESOPHAGOGASTRODUODENOSCOPY (EGD) WITH PROPOFOL;  Surgeon: Lanelle Bal, DO;  [facility];  Service: Endoscopy;  Laterality: N/A;   ESOPHAGOGASTRODUODENOSCOPY (EGD) WITH PROPOFOL N/A 09/03/2022   Procedure: ESOPHAGOGASTRODUODENOSCOPY (EGD) WITH PROPOFOL;  Surgeon: Jeani Hawking, MD;  Location: [facility] ENDOSCOPY;  Service: Gastroenterology;  Laterality: N/A;   EYE SURGERY Bilateral    lazer   FLEXIBLE SIGMOIDOSCOPY N/A 05/23/2022   Procedure: FLEXIBLE SIGMOIDOSCOPY;  Surgeon: Jeani Hawking, MD;  Location: [facility] ENDOSCOPY;  Service: Gastroenterology;  Laterality: N/A;   FLEXIBLE SIGMOIDOSCOPY N/A 05/24/2022   Procedure: FLEXIBLE SIGMOIDOSCOPY;  Surgeon: Imogene Burn, MD;  Location: [facility] ENDOSCOPY;  Service: Gastroenterology;  Laterality: N/A;   FLEXIBLE SIGMOIDOSCOPY N/A 09/03/2022   Procedure: FLEXIBLE SIGMOIDOSCOPY;  Surgeon: Jeani Hawking, MD;  Location: [facility] ENDOSCOPY;  Service: Gastroenterology;  Laterality:  N/A;   HEMOSTASIS CLIP PLACEMENT  03/13/2019   Procedure: HEMOSTASIS CLIP PLACEMENT;  Surgeon: Beverley Fiedler, MD;  Location: [facility] ENDOSCOPY;  Service: Gastroenterology;;   HEMOSTASIS CLIP PLACEMENT  05/23/2022   Procedure: HEMOSTASIS CLIP PLACEMENT;  Surgeon: Jeani Hawking, MD;  Location: [facility] ENDOSCOPY;  Service: Gastroenterology;;   HEMOSTASIS CONTROL  05/24/2022   Procedure: HEMOSTASIS CONTROL;  Surgeon: Imogene Burn, MD;  Location: [facility] ENDOSCOPY;  Service: Gastroenterology;;   HOT HEMOSTASIS N/A 05/23/2022   Procedure: HOT HEMOSTASIS (ARGON PLASMA COAGULATION/BICAP);  Surgeon: Jeani Hawking, MD;  Location: [facility] ENDOSCOPY;  Service: Gastroenterology;  Laterality: N/A;   I & D EXTREMITY Left 05/05/2022   Procedure: IRRIGATION AND DEBRIDEMENT LEFT ARM AV FISTULA;  Surgeon: Cephus Shelling, MD;  Location: [facility] OR;  Service: Vascular;  Laterality: Left;   I & D EXTREMITY N/A 11/14/2022   Procedure: IRRIGATION AND DEBRIDEMENT OF LOWER EXTREMITY WOUND;  Surgeon: Nadara Mustard, MD;  [facility];  Service: Orthopedics;  Laterality: N/A;   INSERTION OF DIALYSIS CATHETER Right 04/02/2022   Procedure: INSERTION OF TUNNELED DIALYSIS CATHETER;  Surgeon: Nada Libman, MD;  [facility];  Service: Vascular;  Laterality: Right;   IR FLUORO GUIDE CV LINE RIGHT  08/10/2023   IR VENOCAVAGRAM IVC  08/10/2023   KNEE ARTHROSCOPY Left 10/25/2006   POLYPECTOMY  03/13/2019   Procedure: POLYPECTOMY;  Surgeon: Beverley Fiedler, MD;  Location: [facility] ENDOSCOPY;  Service: Gastroenterology;;   REMOVAL OF STONES  02/03/2022   Procedure: REMOVAL OF STONES;  Surgeon: Jeani Hawking, MD;  Location: [facility] ENDOSCOPY;  Service: Gastroenterology;;   REVISON OF ARTERIOVENOUS FISTULA Left 08/20/2022   Procedure: REVISON OF LEFT ARM ARTERIOVENOUS FISTULA;  Surgeon: Nada Libman, MD;  [facility];  Service: Vascular;  Laterality: Left;   RIGHT HEART CATH N/A 07/24/2017   Procedure: RIGHT HEART CATH;  Surgeon: Dolores Patty,  MD;  [facility];  Service: Cardiovascular;  Laterality: N/A;   RIGHT HEART CATHETERIZATION N/A 09/22/2013   Procedure: RIGHT HEART CATH;  Surgeon: Dolores Patty, MD;  Location: [facility] CATH LAB;  Service: Cardiovascular;  Laterality: N/A;   SHOULDER ARTHROSCOPY WITH OPEN ROTATOR CUFF REPAIR Right 03/14/2014   Procedure: RIGHT SHOULDER ARTHROSCOPY WITH BICEPS RELEASE, OPEN SUBSCAPULA REPAIR, OPEN SUPRASPINATUS REPAIR.;  Surgeon: Cammy Copa, MD;  Location: [facility] OR;  Service: Orthopedics;  Laterality: Right;   SPHINCTEROTOMY  02/03/2022   Procedure: SPHINCTEROTOMY;  Surgeon: Jeani Hawking, MD;  Location: [facility] ENDOSCOPY;  Service: Gastroenterology;;   STUMP REVISION Right 06/13/2022   Procedure: REVISION RIGHT BELOW KNEE AMPUTATION;  Surgeon: Nadara Mustard, MD;  Location: [facility] OR;  Service: Orthopedics;  Laterality: Right;   TEE WITHOUT CARDIOVERSION N/A 02/04/2022   Procedure: TRANSESOPHAGEAL ECHOCARDIOGRAM (TEE);  Surgeon: Dolores Patty, MD;  Location: [facility] ENDOSCOPY;  Service: Cardiovascular;  Laterality: N/A;   THROMBECTOMY W/ EMBOLECTOMY Left 08/20/2022   Procedure: THROMBECTOMY OF LEFT ARM ARTERIOVENOUS FISTULA;  Surgeon: Nada Libman, MD;  Location: [facility] OR;  Service: Vascular;  Laterality: Left;   TOE AMPUTATION Right 02/24/2018   GREAT TOE AND 2ND TOE AMPUTATION   TUBAL LIGATION  1970's   UPPER EXTREMITY VENOGRAPHY N/A 07/31/2023   Procedure: UPPER EXTREMITY VENOGRAPHY;  Surgeon: Leonie Douglas, MD;  [facility];  Service: Cardiovascular;  Laterality: N/A;   Patient Active Problem List   Diagnosis Date Noted   Pyelonephritis 08/06/2023   Complicated UTI (urinary tract infection) 08/05/2023   Atrial fibrillation (HCC) 06/17/2023   Atopic dermatitis 06/17/2023   Hyperosmolar hyperglycemic state (HHS) (HCC) 02/05/2023   Gastroparesis 02/05/2023   Depression 01/29/2023   Intractable nausea and vomiting 01/20/2023   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 difficile colitis 06/05/2022   Spleen hematoma 06/05/2022   Dehiscence of amputation stump of right lower extremity (HCC) 06/05/2022   Rectal ulcer 05/27/2022   ESRD (end stage renal disease) (HCC) 05/27/2022   GI  bleed 05/23/2022   Difficult intravenous access 05/23/2022   Gangrene of right foot (HCC) 05/02/2022   S/P BKA (below knee amputation) unilateral, right (HCC) 05/02/2022   Unspecified protein-calorie malnutrition (HCC) 04/15/2022   Secondary hyperparathyroidism of renal origin (HCC) 04/14/2022   Coagulation defect, unspecified (HCC) 04/09/2022   Acquired absence of other left toe(s) (HCC) 04/07/2022   Allergy, unspecified, initial encounter 04/07/2022   Dependence on renal dialysis (HCC) 04/07/2022   Gout due to renal impairment, unspecified site 04/07/2022   Hypertensive heart and chronic kidney disease with heart failure and with stage 5 chronic kidney disease, or end stage renal disease (HCC) 04/07/2022   Personal history of transient ischemic attack (TIA), and cerebral infarction without residual deficits 04/07/2022   Renal osteodystrophy 04/07/2022   Venous stasis ulcer of right calf (HCC) 03/31/2022   Fistula, colovaginal 03/26/2022   Diarrhea 03/26/2022   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   Sepsis without acute organ dysfunction (HCC)    Bacteremia    Acute pancreatitis 02/01/2022   Abdominal pain  02/01/2022   SIRS (systemic inflammatory response syndrome) (HCC) 02/01/2022   Transaminitis 02/01/2022   History of anemia due to chronic kidney disease 02/01/2022   Paroxysmal atrial fibrillation (HCC) 02/01/2022   Uncontrolled type 2 diabetes mellitus with hyperglycemia, with long-term current use of insulin (HCC) 01/14/2022   NSTEMI (non-ST elevated myocardial infarction) (HCC) 03/05/2021   Acute renal failure superimposed on stage 4 chronic kidney disease (HCC) 08/22/2020   Hypoalbuminemia 05/25/2020   GERD (gastroesophageal reflux disease) 05/25/2020   Pressure injury of skin 05/17/2020   Acute on chronic combined systolic and diastolic congestive heart failure (HCC) 03/07/2020   Type 2 diabetes mellitus with diabetic polyneuropathy, with long-term current use of insulin (HCC) 03/07/2020   Obesity, Class III, BMI 40-49.9 (morbid obesity) (HCC) 03/07/2020   Common bile duct (CBD) obstruction 05/28/2019   Benign neoplasm of ascending colon    Benign neoplasm of transverse colon    Benign neoplasm of descending colon    Benign neoplasm of sigmoid colon    Gastric polyps    Hyperkalemia 03/11/2019   Prolonged QT interval 03/11/2019   Acute blood loss anemia 03/11/2019   Onychomycosis 06/21/2018   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)    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 (HCC)    PVD (peripheral vascular disease) (HCC) 10/26/2017   E-coli UTI 07/27/2017   Hypothyroidism 07/27/2017   AKI (acute kidney injury) (HCC)    PAH (pulmonary artery hypertension) (HCC)    Impaired ambulation 07/19/2017   Leg cramps 02/27/2017   Peripheral edema 01/12/2017   Diabetic neuropathy (HCC) 11/12/2016   Anemia of chronic disease 10/03/2015   Historical diagnosis of generalized anxiety disorder 10/03/2015   Secondary insomnia 10/03/2015   Acute bronchitis 09/05/2015   Hyperglycemia due to diabetes mellitus (HCC) 06/07/2015   Non compliance with medical  treatment 04/17/2014   Rotator cuff tear 03/14/2014   Obesity 09/23/2013   Chronic HFrEF (heart failure with reduced ejection fraction) (HCC) 06/03/2013   Hypotension 12/25/2012   Hypokalemia 12/25/2012   Hyponatremia 12/25/2012   Urinary incontinence    MDD (major depressive disorder), recurrent episode, moderate (HCC) 11/12/2010   RBBB (right bundle branch block)    Wide-complex tachycardia    Coronary artery disease    Hyperlipemia 01/22/2009   Chronic hypotension 01/22/2009    ONSET DATE: 03/09/2023 MD referral to PT  REFERRING DIAG: S88.111D (ICD-10-CM) - Below-knee amputation of right lower extremity, subsequent encounter  THERAPY DIAG:  Unsteadiness on feet  Impaired functional mobility, balance, gait, and endurance  Muscle weakness (generalized)  Other abnormalities of gait and mobility  Phantom limb syndrome with pain (HCC)  Rationale for Evaluation and Treatment: Rehabilitation  SUBJECTIVE:  SUBJECTIVE STATEMENT: States that she was able to walk in the //bars with [facility]. He made some adjustments made to socket with large pad in back for a better fit and issued new smaller shrinkers for night to control edema fluctuations better.   PERTINENT HISTORY: Right TTA 05/02/22, Lymphedema, idiopathic chronic venous HTN with ulcer, depression, colitis, diverticulitis, ESRD, gout, NSTEMI, DM2, polyneuropathy, PVD, CAD, right bundle branch block, mini strokes  PAIN:  Are you having pain? Back pain  PRECAUTIONS: Fall and Other: No BP LUE  WEIGHT BEARING RESTRICTIONS: No  FALLS: Has patient fallen in last 6 months? No  LIVING ENVIRONMENT: Lives with: lives with their spouse and 2 small dogs Lives in: mobile home Home Access: Ramped entrance Home layout: One level Stairs: Yes: External: 6-7 steps; can reach both Has following equipment at home: Single point cane, Environmental consultant - 2 wheeled, Environmental consultant - 4 wheeled, Wheelchair (manual), [employer], Grab bars, and Ramped  entry  OCCUPATION:  retired  PLOF: prior to amputation for ~year used RW in home & community  PATIENT GOALS:  to use prosthesis to walk, get out w/c in & out of car, get to bathroom.   OBJECTIVE:  COGNITION: Overall cognitive status:  Eval on 03/23/2023:   Within functional limits for tasks assessed  POSTURE:  Eval on 03/23/2023:  rounded shoulders, forward head, increased thoracic kyphosis, flexed trunk , and weight shift left  LOWER EXTREMITY ROM: ROM Right eval Left eval  Hip flexion    Hip extension    Hip abduction    Hip adduction    Hip internal rotation    Hip external rotation    Knee flexion    Knee extension    Ankle dorsiflexion    Ankle plantarflexion    Ankle inversion    Ankle eversion     (Blank rows = not tested)  LOWER EXTREMITY MMT:  MMT Right eval Left eval  Hip flexion 3-/5 3-/5  Hip extension 2/5 2/5  Hip abduction 2+/5 2+/5  Hip adduction    Hip internal rotation    Hip external rotation    Knee flexion 3-/5 3-/5  Knee extension 3-/5 3-/5  Ankle dorsiflexion    Ankle plantarflexion    Ankle inversion    Ankle eversion    At Evaluation all strength testing is grossly seated and functionally standing / gait. (Blank rows = not tested)  TRANSFERS: 07/27/2023: -Sit>stand from wc with RW: MinA with heavy vc for sequencing and offweighting LE -Stand>sit wc with RW: CGA with patient performing safe controlled descent  06/15/2023: -Sit to stand transfers with RW wheelchair<>mat table at varied height: 19in with maxA to rise to stand; 22in ModA rise to stand, 24in min-modA rise to stand. -stand pivot transfer with RW w/c to mat table with modA once standing.  -Lateral scooting wc<>table at level height SBA; vc for LE positioning for optimal use of LE during transfer -lateral scoot R: modA due to fatigue and uneven surface transfer from low surface to higher surface   05/04/2023: Squat pivot transfers with modA.  04/21/2023: Pt sit to stand  w/c to //bar with one hand pushing on w/c and other hand on //bar with modA first time & MinA 2nd/ 3rd time.  Best  is RUE on //bar & LUE on w/c.  Stand to sit reaching LUE to w/c with minA to control descent.   PT demo & verbal cues on sit to/from stand w/c to RW.  1st time modA 2 person. 2nd time maxA one person.  Stand to sit with minA and constant cueing. Scooting transfers with minA with w/c & mat direct contact & same ht.   Eval on 03/23/2023:  Sit to stand: Max A (two-person assist) from 22" w/c to rolling walker Stand to sit: Max A (two-person assist) rolling walker to wheelchair  FUNCTIONAL TESTs:   07/27/2023: Patient stood with RW SBA for 54sec before c/o lightheadedness and sitting down to toilet. Resisted nudges applied posteriorly to simulate hygienic cleaning. VC for weight shifts when LE become tired.  BP: after standing 3min54sec 161/82 HR 92  06/15/2023: -Static stance for standing with RW; requiring Mod-MaxA for rise to stand d/t patient unsteady on feet posterior lean without proper righting strategy, SBA for stand   04/21/2023: Pt able to stand 60 sec with BUE on //bars with supervision.  Pt able to stand for 1 min with RW support with minA.  Eval on 03/23/2023: Patient maintained upright holding rolling walker with mod assist 2 people for safety for 30 seconds.  GAIT: 07/27/2023: Patient ambulated 97ft with narrow bathroom entry and 90deg turn to sit on commode CGA with minA and heavy vc for progression and sequencing of RW. While turning to sit on commode patient lost balance laterally requiring mod-maxA from therapist and patient use of grab bar to regain balance.   06/17/2023: Patient ambulated 51ft (maximal tolerated distance) with RW ModA (2 person for safety). Required VC for LE sequencing and minA to steer walker and prevent tip over.   04/21/2023: Pt amb 5' in //bars with modA (2 person for safety) with PT verbal & tactile cues on sequence and weight  shift.  Pt amb 10' with RW with maxA (2 person for safety) with PT verbal & tactile cues on sequence and weight shift.  Eval on 03/23/2023:  Patient took 2 steps (one per LE) with +2 max assist with rolling walker and TTA prosthesis.  Patient adducting prosthesis stepping on left foot with no awareness.  CURRENT PROSTHETIC WEAR ASSESSMENT: 06/17/2023:  Independence with prosthetic care:  Patient and husband are independent with: skin check, prosthetic cleaning, ply sock cleaning, proper wear schedule/adjustment Patient is dependent with: residual limb care, correct ply sock adjustment, and proper weight-bearing schedule/adjustment Donning prosthesis: husband is independent with proper donning. Patient requires MaxA. Patient can verbally direct someone how to properly assist. Doffing prosthesis: Husband is independent with proper donning. Patient requires modA. Patient can verbally direct someone how to properly assist. Prosthetic wear tolerance: majority of awake hours including dialysis hours, 1x/day, 3-4 days/week Prosthetic weight bearing tolerance: able to tolerate of standing without complains of prosthetic limb pain. Time not limited by limb discomfort but limited by muscle fatigue.   Eval on 03/23/2023:   Patient is dependent with: skin check, residual limb care, prosthetic cleaning, ply sock cleaning, correct ply sock adjustment, proper wear schedule/adjustment, and proper weight-bearing schedule/adjustment Donning prosthesis: Max A Doffing prosthesis: Min A Prosthetic wear tolerance: 2-6 hours, 1x/day, 3-4 days/week Prosthetic weight bearing tolerance: 3 minutes with limb pain Edema: pitting edema Residual limb condition: No open areas, dry skin, normal color and temperature, cylindrical shape Prosthetic description: Silicone liner with pin lock suspension, total contact socket with flexible inner socket, sach foot  TODAY'S TREATMENT:                                                                                                              DATE: 08/19/2023 Prosthetic training: -Patient sit>stand x3 maxA requiring multiple attempts and heavy vc for sequencing of LE walk back and UE to RW. Patient unable to walk LLE under herself though she had shifted to the rt appropriately -Continued education on preventing pulling on RW to rise to stand as well as increasing forward lean while standing to reduce instances of backwards LOB. -patient was able to ambulate with minA with RW 68ft before needing to sit in wheelchair. She had sudden onset of need to sit requiring w/c to immediately be brought under her.   - patient doffed prosthetic leg independently without cuing but noted cutoff sock incorrectly as outside layer; adjustments were made to layer of cut off sock with patient and husband verbalizing understanding, patient required minA to don prosthetic leg and vc   Therapeutic Exercise: Blue tband seated rotations x10ea; vc for proper form Seated marching x10ea without back support but BUE support on w/c armrests Seated knee extension and flexion; x10ea without back support but BUE support on w/c armrests PT added above 2 exercises to HEP with HO, verbal & demo cues.  Pt verbalized understanding.    TREATMENT:                                                                                                             DATE: 08/17/2023 Prosthetic training: -Patient performed sit>stand from [facility] with RW ModA requiring vc for sequencing of LE to rise to stand, stand>sit MinA with vc for sequencing -Stood CGA with intermittent close SBA for performing side to side shifting with RW and vc -Stand pivot Lt with RW ModA for sit to stand, ModA for transfer, CGA for stand>sit -sit to Stand, ambulate with RW 54ft MinA with heavy vc 90deg turn Rt to sit in WC. VC for proper sequencing of rw and LE to prevent falls  -patients prosthesis became rotated on her limb, patient was able  to instruct therapist on proper sequencing to take prosthesis on and off without cuing with accuracy. Educated on proper fit of prosthesis with sock wear including need to adjust during day as fluid is frequently pumped out of limb with activity especially weight bearing and visualization of knee cap. PT recommended seeing prosthetist for more pads in socket and new shrinkers for night time to maintain limb volume better. Patient and husband verbalized understanding however will require reeducation.  TREATMENT:                                                                                                                            DATE: 08/05/2023 Upon arrival patient and husband stated that she was not feeling well today with patient describing back pain, abdominal pain, pain/burning with urinating, and is lethargic at times only aroused when tactile stimulation is applied.  Patient wanted to participate with physical therapy, once gait belt was applied therapist felt the patient was sweating though she had been sitting in the [facility] for almost an hr before starting therapy session. Pt temperature was taken reading 96.4 however patient had red cheeks, sweating, and increased lethargy. She reported taking tylenol prior to coming to PT.  Patient initially did not tell us that she was advised yesterday at dialysis that she should go to the ED due to symptoms. Patient instructed on urgency of receiving medical treatment to prevent further progression of possible infection. Patient and husband agreeable to going directly to ED at termination of session.  And end of session patient was not oriented to day, requiring 3 cues to become oriented.   TREATMENT:                                                                                                                            DATE: 08/03/2023 Prosthetic training: -Patient ambulated 97ft around 1 obstacle + 28ft weaving between 2 obstacles to 90deg turn to sit in  chair with use of RW CGA with intermittent close SBA, 1 instance of L lateral LOB with patient able to rectify under own power without therapist intervention- R foot crossed and stepped on L foot. With fatigue patient required CGA throughout as well as increased time for motor planning and sequencing.  -Patients prosthesis became twisted to the R, patient asked to rectify requiring verbal cuing and leading questions for patient to properly verbalize sequencing and putting on of prosthetic leg. Able to press pin release independently with 2 attempts, verbalized key points for lining up leg correctly before securing into socket.  -patient and husband encouraged to continue to practice patient don/doff leg, ensuring clean liner with application of cortizone to sooth itchiness, as well as to continue HEP for core and back strengthening.  Self care:  -STSx3 with LE walk back; 1st bout ModA with heavy vc for sequencing 2nd and 3rd bout ModA without vc as patient was  able to sequence LE without cuing.  -Stood with RW accepting posterior pertubation's applied at the hip to simulate hygenic cleaning; 1st bout 64min15sec 2nd bout 70min3sec.    PATIENT EDUCATION: PATIENT EDUCATED ON FOLLOWING PROSTHETIC CARE: Education details: Use of stockinette under proximal liner to decrease itching sensation, no wounds presents to PT recommended not using Vive wear under liner Skin check, Prosthetic cleaning, Propper donning, and Proper wear schedule/adjustment Prosthetic wear tolerance: 3 hours 2x/day, 4 days/week Person educated: Patient and Spouse Education method: Explanation, Tactile cues, and Verbal cues Education comprehension: verbalized understanding, verbal cues required, tactile cues required, and needs further education  HOME EXERCISE PROGRAM: Access Code: [email or web address] URL: [email or web address] Date: 08/19/2023 Prepared by: Vladimir Faster   Exercises - Supine Bridge  - 1 x daily - 4 x weekly - 2  sets - 5 reps - 2 seconds hold - Supine Lower Trunk Rotation  - 1 x daily - 4 x weekly - 2 sets - 5 reps - 5 seconds hold - Supine Heel Slide with Strap  - 1 x daily - 4 x weekly - 2 sets - 5 reps - 2-3 seconds hold - Supine Hip Abduction  - 1 x daily - 4 x weekly - 2 sets - 5 reps - 2-3 seconds hold - Seated Hip Flexion Toward Target  - 1 x daily - 4 x weekly - 2 sets - 5 reps - 5 seconds hold - Seated Eccentric Abdominal Lean Back  - 1 x daily - 4 x weekly - 2 sets - 5 reps - 5 seconds hold - Seated Sidebending  - 1 x daily - 4 x weekly - 2 sets - 5 reps - 5 seconds hold - Seated Trunk Rotation with Crossed Arms  - 1 x daily - 4 x weekly - 2 sets - 5 reps - 5 seconds hold - Seated March  - 1 x daily - 4 x weekly - 2 sets - 10 reps - 5 seconds hold - Seated Knee Flexion Extension AROM   - 1 x daily - 4 x weekly - 2 sets - 10 reps - 5 seconds hold    ASSESSMENT:  CLINICAL IMPRESSION:  Patient required increased assist with standing compared to recent sessions. Deficits in strength, balance, and coordination remain. PT updated HEP which pt & husband appear to understand.  Patient will benefit from continued skilled physical therapy to address deficits and improve overall function.     OBJECTIVE IMPAIRMENTS: Abnormal gait, decreased activity tolerance, decreased balance, decreased endurance, decreased knowledge of condition, decreased knowledge of use of DME, decreased mobility, difficulty walking, decreased ROM, decreased strength, decreased safety awareness, postural dysfunction, prosthetic dependency , and pain.   ACTIVITY LIMITATIONS: standing, transfers, and locomotion level  PARTICIPATION LIMITATIONS: community activity, household mobility and dependency / burden of care on family  PERSONAL FACTORS: Age, Fitness, Past/current experiences, Time since onset of injury/illness/exacerbation, and 3+ comorbidities: see PMH  are also affecting patient's functional outcome.   REHAB POTENTIAL:  Good  CLINICAL DECISION MAKING: Evolving/moderate complexity  EVALUATION COMPLEXITY: Moderate   GOALS: Goals reviewed with patient? Yes  SHORT TERM GOALS: Target date: 07/15/2023:  Patient & husband verbalize proper residual limb care and understanding for adjusting ply socks.  Baseline: SEE OBJECTIVE DATA Goal status:   MET 07/27/2023 2.  Patient able to sit to stand w/c to rolling walker with 1 person modA and maintain upright with RW for 4 min with SBA to assist with toileting needs/hygienic cleaning.  Baseline: SEE  OBJECTIVE DATA Goal status: partially met 07/08/2023  3. Patient ambulates 102ft including turning 90* to position to sit with RW & prosthesis with modA to navigate reaching bathroom in home.  Baseline: SEE OBJECTIVE DATA Goal status: partially met 07/08/2023   LONG TERM GOALS: Target date: 09/10/2023  Patient & husband demonstrates & verbalizes understanding of prosthetic care to enable safe utilization of prosthesis. Baseline: SEE OBJECTIVE DATA Goal status: ongoing 07/27/2023  Patient tolerates prosthesis wear >80% of awake hours on non-dialysis days and >50% on dialysis days without skin issues and limb pain </= 4/10. Baseline: SEE OBJECTIVE DATA Goal status: MET 06/15/2023  Scooting transfers with minA and sit to/from stand transfers with modA.  Baseline: SEE OBJECTIVE DATA Goal status: ongoing  07/27/2023  Patient ambulates >74ft including turning 90* to position to sit with prosthesis & RW with modA for household navigation.  Baseline: SEE OBJECTIVE DATA Goal status: ongoing  07/27/2023  Patient able to maintain static stance with RW support with SBA for 5 min to assist with hygienic cleaning.  Baseline: SEE OBJECTIVE DATA Goal status:  ongoing  07/27/2023   PLAN:  PT FREQUENCY: 2x/week  PT DURATION: 12 weeks  PLANNED INTERVENTIONS: 97164- PT Re-evaluation, 97110-Therapeutic exercises, 97530- Therapeutic activity, 97112- Neuromuscular re-education, [phone]-  Self Care, 62130- Gait training, [id number]- Prosthetic training, Patient/Family education, Balance training, DME instructions, Therapeutic exercises, Therapeutic activity, Neuromuscular re-education, Gait training, and Self Care  PLAN FOR NEXT SESSION:  continue standing tolerance and ambulation with turn 90deg to sit, revisit standing sequencing, look at goals    Misty Foutz, Montie Gelardi, Student-PT 08/19/2023, 5:25 PM  This entire session of physical therapy was performed under the direct supervision of PT signing evaluation /treatment. PT reviewed note and agrees.   Vladimir Faster, PT, DPT 08/20/2023, 7:07 AM

## 2023-08-19 NOTE — Telephone Encounter (Signed)
 Appointments scheduled

## 2023-08-21 ENCOUNTER — Encounter: Payer: Self-pay | Admitting: Physician Assistant

## 2023-08-21 ENCOUNTER — Ambulatory Visit: Admitting: Physician Assistant

## 2023-08-21 VITALS — BP 101/63 | HR 76 | Temp 98.3°F | Ht 65.0 in

## 2023-08-21 DIAGNOSIS — N186 End stage renal disease: Secondary | ICD-10-CM | POA: Diagnosis not present

## 2023-08-21 DIAGNOSIS — Z992 Dependence on renal dialysis: Secondary | ICD-10-CM | POA: Diagnosis not present

## 2023-08-21 NOTE — Progress Notes (Signed)
 HISTORY AND PHYSICAL     CC:  dialysis access Requesting Provider:  Donita Brooks, MD  HPI: This is a 73 y.o. female here for evaluation of her hemodialysis access.   She comes in today for follow up and here with her husband.  She was to follow up with Dr. Lenell Antu to discuss BP and graft access.  She states that her blood pressure continues to be low.  At one point she was told to take 4 midodrine before dialysis.  She states that she almost passed out at dialysis due to low blood pressure.  Her BP today is 100 systolic.    She has TDC that was placed 08/10/2023 by IR.  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 Lajoyce Corners.   Dialysis access history: -1st stage left BVT 03/28/2022 Dr. Myra Gianotti -I&D left arm AVF 05/05/2022 Dr. Chestine Spore -2nd stage left BVT 07/31/2022 Dr. Myra Gianotti -thrombectomy and revision of left BVT 08/20/2022 Dr. Myra Gianotti -fistulogram 11/07/2022 Dr. Lenell Antu -right 1st stage BVT 04/27/2023 Dr. Lenell Antu -BUE venogram 07/31/2023 Dr. Lenell Antu   Pt is on dialysis.   Days of dialysis if applicable:  T/T/S    HD center:  [place]  location.   The pt is on a statin for cholesterol management.  The pt is not on a daily aspirin.  Other AC:  Eliquis The pt is not on medication for hypertension.  The pt is  on medication for diabetes.   Tobacco hx:  former  Past Medical History:  Diagnosis Date   Anemia    hx   Anxiety    Arthritis    "generalized" (03/15/2014)   CAD (coronary artery disease)    MI in 2000 - MI  2007 - treated bare metal stent (no nuclear since then as 9/11)   Carotid artery disease (HCC)    Chronic diastolic heart failure (HCC)    a) ECHO (08/2013) EF 55-60% and RV function nl b) RHC (08/2013) RA 4, RV 30/5/7, PA 25/10 (16), PCWP 7, Fick CO/CI 6.3/2.7, PVR 1.5 WU, PA 61 and 66%   Daily headache    "~ every other day; since I fell in June" (03/15/2014)   Depression    Diabetic retinopathy (HCC)    Dyslipidemia    ESRD (end stage renal disease) (HCC)    Dialysis  on Tues Thurs Sat   Exertional shortness of breath    GERD (gastroesophageal reflux disease)    History of blood transfusion    History of kidney stones    HTN (hypertension)    Hypothyroidism    Myocardial infarction (HCC)    Obesity    Osteoarthritis    PAF (paroxysmal atrial fibrillation) (HCC)    Peripheral neuropathy    bilateral feet/hands   PONV (postoperative nausea and vomiting)    RBBB (right bundle branch block)    Old   Stroke (HCC)    mini strokes   Type II diabetes mellitus (HCC)    Type II, [name]. patient has omnipod insulin pump with Novolin R Insulin    Past Surgical History:  Procedure Laterality Date   A/V FISTULAGRAM Left 11/07/2022   Procedure: A/V Fistulagram;  Surgeon: Leonie Douglas, MD;  [facility];  Service: Cardiovascular;  Laterality: Left;   ABDOMINAL HYSTERECTOMY  1980's   AMPUTATION Right 02/24/2018   Procedure: RIGHT FOOT GREAT TOE AND 2ND TOE AMPUTATION;  Surgeon: Nadara Mustard, MD;  Location: [facility] OR;  Service: Orthopedics;  Laterality: Right;   AMPUTATION Right 04/30/2018   Procedure: RIGHT TRANSMETATARSAL AMPUTATION;  Surgeon: Nadara Mustard, MD;  Location: [facility] OR;  Service: Orthopedics;  Laterality: Right;   AMPUTATION Right 05/02/2022   Procedure: RIGHT BELOW KNEE AMPUTATION;  Surgeon: Nadara Mustard, MD;  Location: [facility] OR;  Service: Orthopedics;  Laterality: Right;   APPLICATION OF WOUND VAC Right 06/13/2022   Procedure: APPLICATION OF WOUND VAC;  Surgeon: Nadara Mustard, MD;  [facility];  Service: Orthopedics;  Laterality: Right;   APPLICATION OF WOUND VAC Left 11/14/2022   Procedure: APPLICATION OF WOUND VAC;  Surgeon: Nadara Mustard, MD;  [facility];  Service: Orthopedics;  Laterality: Left;   AV FISTULA PLACEMENT Left 04/02/2022   Procedure: LEFT ARM ARTERIOVENOUS (AV) FISTULA CREATION;  Surgeon: Nada Libman, MD;  [facility];  Service: Vascular;  Laterality: Left;  PERIPHERAL NERVE  BLOCK   AV FISTULA PLACEMENT Right 04/27/2023   Procedure: RIGHT ARM BRACHIOBASILIC ARTERIOVENOUS (AV) FISTULA CREATION;  Surgeon: Leonie Douglas, MD;  [facility];  Service: Vascular;  Laterality: Right;   BASCILIC VEIN TRANSPOSITION Left 07/31/2022   Procedure: LEFT ARM SECOND STAGE BASILIC VEIN TRANSPOSITION;  Surgeon: Nada Libman, MD;  [facility];  Service: Vascular;  Laterality: Left;   BIOPSY  05/27/2020   Procedure: BIOPSY;  Surgeon: Lanelle Bal, DO;  [facility];  Service: Endoscopy;;   CATARACT EXTRACTION, BILATERAL Bilateral ?2013   COLONOSCOPY W/ POLYPECTOMY     COLONOSCOPY WITH PROPOFOL N/A 03/13/2019   Procedure: COLONOSCOPY WITH PROPOFOL;  Surgeon: Beverley Fiedler, MD;  Location: [facility] ENDOSCOPY;  Service: Gastroenterology;  Laterality: N/A;   CORONARY ANGIOPLASTY WITH STENT PLACEMENT  1999; 2007   "1 + 1"   ERCP N/A 02/03/2022   Procedure: ENDOSCOPIC RETROGRADE CHOLANGIOPANCREATOGRAPHY (ERCP);  Surgeon: Jeani Hawking, MD;  Location: [facility] ENDOSCOPY;  Service: Gastroenterology;  Laterality: N/A;   ESOPHAGOGASTRODUODENOSCOPY N/A 02/12/2023   Procedure: ESOPHAGOGASTRODUODENOSCOPY (EGD);  Surgeon: Jeani Hawking, MD;  [facility];  Service: Gastroenterology;  Laterality: N/A;   ESOPHAGOGASTRODUODENOSCOPY (EGD) WITH PROPOFOL N/A 03/13/2019   Procedure: ESOPHAGOGASTRODUODENOSCOPY (EGD) WITH PROPOFOL;  Surgeon: Beverley Fiedler, MD;  Location: [facility] ENDOSCOPY;  Service: Gastroenterology;  Laterality: N/A;   ESOPHAGOGASTRODUODENOSCOPY (EGD) WITH PROPOFOL N/A 05/27/2020   Procedure: ESOPHAGOGASTRODUODENOSCOPY (EGD) WITH PROPOFOL;  Surgeon: Lanelle Bal, DO;  [facility];  Service: Endoscopy;  Laterality: N/A;   ESOPHAGOGASTRODUODENOSCOPY (EGD) WITH PROPOFOL N/A 09/03/2022   Procedure: ESOPHAGOGASTRODUODENOSCOPY (EGD) WITH PROPOFOL;  Surgeon: Jeani Hawking, MD;  Location: [facility] ENDOSCOPY;  Service: Gastroenterology;  Laterality: N/A;   EYE SURGERY  Bilateral    lazer   FLEXIBLE SIGMOIDOSCOPY N/A 05/23/2022   Procedure: FLEXIBLE SIGMOIDOSCOPY;  Surgeon: Jeani Hawking, MD;  Location: [facility] ENDOSCOPY;  Service: Gastroenterology;  Laterality: N/A;   FLEXIBLE SIGMOIDOSCOPY N/A 05/24/2022   Procedure: FLEXIBLE SIGMOIDOSCOPY;  Surgeon: Imogene Burn, MD;  Location: [facility] END[facility]COPY;  Service: Gastroenterology;  Laterality: N/A;   FLEXIBLE SIGMOIDOSCOPY N/A 09/03/2022   Procedure: FLEXIBLE SIGMOIDOSCOPY;  Surgeon: Jeani Hawking, MD;  Location: [facility] ENDOSCOPY;  Service: Gastroenterology;  Laterality: N/A;   HEMOSTASIS CLIP PLACEMENT  03/13/2019   Procedure: HEMOSTASIS CLIP PLACEMENT;  Surgeon: Beverley Fiedler, MD;  Location: [facility] ENDOSCOPY;  Service: Gastroenterology;;   HEMOSTASIS CLIP PLACEMENT  05/23/2022   Procedure: HEMOSTASIS CLIP PLACEMENT;  Surgeon: Jeani Hawking, MD;  Location: [name] ENDOSCOPY;  Service: Gastroenterology;;   HEMOSTASIS CONTROL  05/24/2022   Procedure: HEMOSTASIS CONTROL;  Surgeon: Imogene Burn, MD;  Location: [facility] ENDOSCOPY;  Service: Gastroenterology;;   HOT HEMOSTASIS N/A 05/23/2022   Procedure: HOT HEMOSTASIS (ARGON PLASMA COAGULATION/BICAP);  Surgeon: Jeani Hawking, MD;  Location: [facility] ENDOSCOPY;  Service: Gastroenterology;  Laterality: N/A;   I & D EXTREMITY Left 05/05/2022   Procedure: IRRIGATION AND DEBRIDEMENT LEFT ARM AV FISTULA;  Surgeon: Cephus Shelling, MD;  Location: [facility] OR;  Service: Vascular;  Laterality: Left;   I & D EXTREMITY N/A 11/14/2022   Procedure: IRRIGATION AND DEBRIDEMENT OF LOWER EXTREMITY WOUND;  Surgeon: Nadara Mustard, MD;  [facility];  Service: Orthopedics;  Laterality: N/A;   INSERTION OF DIALYSIS CATHETER Right 04/02/2022   Procedure: INSERTION OF TUNNELED DIALYSIS CATHETER;  Surgeon: Nada Libman, MD;  [facility];  Service: Vascular;  Laterality: Right;   IR FLUORO GUIDE CV LINE RIGHT  08/10/2023   IR VENOCAVAGRAM IVC  08/10/2023   KNEE ARTHROSCOPY Left 10/25/2006   POLYPECTOMY  03/13/2019    Procedure: POLYPECTOMY;  Surgeon: Beverley Fiedler, MD;  Location: [facility] ENDOSCOPY;  Service: Gastroenterology;;   REMOVAL OF STONES  02/03/2022   Procedure: REMOVAL OF STONES;  Surgeon: Jeani Hawking, MD;  Location: [facility] ENDOSCOPY;  Service: Gastroenterology;;   REVISON OF ARTERIOVENOUS FISTULA Left 08/20/2022   Procedure: REVISON OF LEFT ARM ARTERIOVENOUS FISTULA;  Surgeon: Nada Libman, MD;  [facility];  Service: Vascular;  Laterality: Left;   RIGHT HEART CATH N/A 07/24/2017   Procedure: RIGHT HEART CATH;  Surgeon: Dolores Patty, MD;  [facility];  Service: Cardiovascular;  Laterality: N/A;   RIGHT HEART CATHETERIZATION N/A 09/22/2013   Procedure: RIGHT HEART CATH;  Surgeon: Dolores Patty, MD;  Location: [facility] CATH LAB;  Service: Cardiovascular;  Laterality: N/A;   SHOULDER ARTHROSCOPY WITH OPEN ROTATOR CUFF REPAIR Right 03/14/2014   Procedure: RIGHT SHOULDER ARTHROSCOPY WITH BICEPS RELEASE, OPEN SUBSCAPULA REPAIR, OPEN SUPRASPINATUS REPAIR.;  Surgeon: Cammy Copa, MD;  Location: [facility] OR;  Service: Orthopedics;  Laterality: Right;   SPHINCTEROTOMY  02/03/2022   Procedure: SPHINCTEROTOMY;  Surgeon: Jeani Hawking, MD;  Location: [facility] ENDOSCOPY;  Service: Gastroenterology;;   STUMP REVISION Right 06/13/2022   Procedure: REVISION RIGHT BELOW KNEE AMPUTATION;  Surgeon: Nadara Mustard, MD;  Location: [facility] OR;  Service: Orthopedics;  Laterality: Right;   TEE WITHOUT CARDIOVERSION N/A 02/04/2022   Procedure: TRANSESOPHAGEAL ECHOCARDIOGRAM (TEE);  Surgeon: Dolores Patty, MD;  Location: [facility] ENDOSCOPY;  Service: Cardiovascular;  Laterality: N/A;   THROMBECTOMY W/ EMBOLECTOMY Left 08/20/2022   Procedure: THROMBECTOMY OF LEFT ARM ARTERIOVENOUS FISTULA;  Surgeon: Nada Libman, MD;  Location: [facility] OR;  Service: Vascular;  Laterality: Left;   TOE AMPUTATION Right 02/24/2018   GREAT TOE AND 2ND TOE AMPUTATION   TUBAL LIGATION  1970's   UPPER EXTREMITY VENOGRAPHY N/A 07/31/2023    Procedure: UPPER EXTREMITY VENOGRAPHY;  Surgeon: Leonie Douglas, MD;  [facility];  Service: Cardiovascular;  Laterality: N/A;    Allergies  Allergen Reactions   Cephalexin Diarrhea and Other (See Comments)   Codeine Nausea And Vomiting and Other (See Comments)    Current Outpatient Medications  Medication Sig Dispense Refill   acetaminophen (TYLENOL) 325 MG tablet Take 2 tablets (650 mg total) by mouth every 6 (six) hours as needed for mild pain (or Fever >/= 101).     albuterol (VENTOLIN HFA) 108 (90 Base) MCG/ACT inhaler Inhale 1-2 puffs into the lungs every 6 (six) hours as needed  for shortness of breath. 1 each 3   amiodarone (PACERONE) 200 MG tablet Take 1 tablet (200 mg total) by mouth daily. 90 tablet 3   apixaban (ELIQUIS) 5 MG TABS tablet Take 1 tablet (5 mg total) by mouth 2 (two) times daily. 180 tablet 1   ascorbic acid (VITAMIN C) 500 MG tablet Take 1 tablet (500 mg total) by mouth daily. 100 tablet 0   atorvastatin (LIPITOR) 80 MG tablet Take 1 tablet (80 mg total) by mouth daily. 90 tablet 1   calcitRIOL (ROCALTROL) 0.25 MCG capsule Take 9 capsules (2.25 mcg total) by mouth Every Tuesday,Thursday,and Saturday with dialysis. 30 capsule 0   clonazePAM (KLONOPIN) 0.5 MG disintegrating tablet Take 1 tablet (0.5 mg total) by mouth 2 (two) times daily. Stop lorazepam. 60 tablet 1   folic acid (FOLVITE) 1 MG tablet Take 1 mg by mouth daily.     folic acid-vitamin b complex-vitamin c-selenium-zinc (DIALYVITE) 3 MG TABS tablet Take 1 tablet by mouth daily.     insulin aspart (NOVOLOG) 100 UNIT/ML injection Inject 0-15 Units into the skin 3 (three) times daily with meals. CBG < 70: treat low blood sugar, CBG 70 - 120: 0 units, CBG 121 - 150: 2 units, CBG 151 - 200: 3 units, CBG 201 - 250: 5 units, CBG 251 - 300: 8 units, CBG 301 - 350: 11 units, CBG 351 - 400: 15 units, CBG > 400: call MD 60 mL 5   insulin glargine (LANTUS) 100 UNIT/ML Solostar Pen Inject 31 Units into  the skin every morning. 15 mL 3   levothyroxine (SYNTHROID) 50 MCG tablet Take 1 tablet (50 mcg total) by mouth daily before breakfast. 90 tablet 1   melatonin 3 MG TABS tablet Take 3 mg by mouth at bedtime.     methocarbamol (ROBAXIN) 750 MG tablet Take 1 tablet (750 mg total) by mouth every 6 (six) hours as needed for muscle spasms. 60 tablet 0   Methoxy PEG-Epoetin Beta (MIRCERA IJ) Mircera     midodrine (PROAMATINE) 10 MG tablet Take 1-2 tablets (10-20 mg total) by mouth Every Tuesday,Thursday,and Saturday with dialysis. 30 tablet 1   nitroGLYCERIN (NITROSTAT) 0.4 MG SL tablet Place 1 tablet (0.4 mg total) under the tongue every 5 (five) minutes as needed. 25 tablet 3   oxyCODONE-acetaminophen (PERCOCET/ROXICET) 5-325 MG tablet Take 1 tablet by mouth every 6 (six) hours as needed for moderate pain (pain score 4-6). 30 tablet 0   OXYGEN Inhale 2 L into the lungs at bedtime.     pantoprazole (PROTONIX) 40 MG tablet Take 1 tablet (40 mg total) by mouth daily. 180 tablet 1   polyethylene glycol (MIRALAX / GLYCOLAX) 17 g packet Take 17 g by mouth daily. (Patient taking differently: Take 17 g by mouth daily as needed for mild constipation or moderate constipation.)     sevelamer carbonate (RENVELA) 800 MG tablet Take 1,600 mg by mouth 3 (three) times daily with meals.     triamcinolone cream (KENALOG) 0.1 % Apply 1 Application topically 2 (two) times daily. Apply to affected areas BID (Patient taking differently: Apply 1 Application topically at bedtime.) 45 g 1   venlafaxine XR (EFFEXOR-XR) 75 MG 24 hr capsule Take 1 capsule (75 mg total) by mouth daily with breakfast. Stop trintellix 90 capsule 1   Zinc 30 MG CAPS Take 30 mg by mouth daily.     No current facility-administered medications for this visit.    Family History  Problem Relation Age of  Onset   Heart attack Mother 82    Social History   Socioeconomic History   Marital status: Married    Spouse name: Ramon Dredge   Number of children:  3   Years of education: 12th   Highest education level: Not on file  Occupational History    Employer: UNEMPLOYED  Tobacco Use   Smoking status: Former    Current packs/day: 0.00    Average packs/day: 3.0 packs/day for 32.0 years (96.0 ttl pk-yrs)    Types: Cigarettes    Start date: 10/24/1965    Quit date: 10/24/1997    Years since quitting: 25.8   Smokeless tobacco: Never  Vaping Use   Vaping status: Never Used  Substance and Sexual Activity   Alcohol use: Not Currently   Drug use: No   Sexual activity: Not Currently    Birth control/protection: Surgical    Comment: Hysterectomy  Other Topics Concern   Not on file  Social History Narrative   Pt lives at home with her spouse.Caffeine Use- 3 sodas daily.    Social Drivers of Corporate investment banker Strain: Low Risk  (08/16/2021)   Overall Financial Resource Strain (CARDIA)    Difficulty of Paying Living Expenses: Not hard at all  Food Insecurity: No Food Insecurity (08/06/2023)   Hunger Vital Sign    Worried About Running Out of Food in the Last Year: Never true    Ran Out of Food in the Last Year: Never true  Transportation Needs: No Transportation Needs (08/06/2023)   PRAPARE - Administrator, Civil Service (Medical): No    Lack of Transportation (Non-Medical): No  Physical Activity: Inactive (08/16/2021)   Exercise Vital Sign    Days of Exercise per Week: 0 days    Minutes of Exercise per Session: 0 min  Stress: No Stress Concern Present (08/16/2021)   [employer] of Occupational Health - Occupational Stress Questionnaire    Feeling of Stress : Not at all  Social Connections: Moderately Isolated (08/06/2023)   Social Connection and Isolation Panel [NHANES]    Frequency of Communication with Friends and Family: More than three times a week    Frequency of Social Gatherings with Friends and Family: More than three times a week    Attends Religious Services: Never    Database administrator or  Organizations: No    Attends Banker Meetings: Never    Marital Status: Married  Catering manager Violence: Not At Risk (08/06/2023)   Humiliation, Afraid, Rape, and Kick questionnaire    Fear of Current or Ex-Partner: No    Emotionally Abused: No    Physically Abused: No    Sexually Abused: No     ROS: [x]  Positive   [ ]  Negative   [ ]  All sytems reviewed and are negative  Cardiac: []  chest pain/pressure []  SOB/DOE  Vascular: []  pain in legs while walking []  pain in feet when lying flat []  swelling in legs  Pulmonary: []  asthma []  wheezing  Neurologic: []  hx CVA/TIA  Hematologic: []  bleeding problems  GI []  GERD  GU: [x]  CKD/renal failure  [x]  HD---[]  M/W/F [x]  T/T/S  Psychiatric: []  hx of major depression  Integumentary: []  rashes []  ulcers  Constitutional: []  fever []  chills   PHYSICAL EXAMINATION:  Today's Vitals   08/21/23 1037  BP: 101/63  Pulse: 76  Temp: 98.3 F (36.8 C)  TempSrc: Temporal  SpO2: 94%  Height: 5\' 5"  (1.651 m)  PainSc: 4  Body mass index is 28.1 kg/m.    General:  WDWN female in NAD Gait: Not observed HENT: WNL Pulmonary: normal non-labored breathing  Cardiac: regular, without carotid bruits Vascular Exam/Pulses:   Right Left  Radial 2+ (normal) 2+ (normal)      ASSESSMENT/PLAN: 73 y.o. female with ESRD here for evaluation for hemodialysis access   -findings on BUE venogram noted.  Pt has not been able to keep access open.  She has soft blood pressures requiring Midodrine.  Her systolic pressure today is 100.  She states that this has been a problem while receiving dialysis.   -she will need an AVG, however, with soft BP, the chance of a graft remaining patent is low.   -she dialyzes in [name] on T/T/S.  Will have her see Dr. Lenell Antu as a 1st appt on a Tuesday in [place] for further discussions and that way she can make her dialysis appt as well.   -discussed with pt that access does not  last forever and will need intervention or even new access at some point.  -pt is on anticoagulation-Eliquis   Doreatha Massed, [facility] [phone]  Clinic MD:   Hetty Blend

## 2023-08-23 DIAGNOSIS — K626 Ulcer of anus and rectum: Secondary | ICD-10-CM | POA: Diagnosis not present

## 2023-08-23 DIAGNOSIS — K219 Gastro-esophageal reflux disease without esophagitis: Secondary | ICD-10-CM | POA: Diagnosis not present

## 2023-08-23 DIAGNOSIS — I5043 Acute on chronic combined systolic (congestive) and diastolic (congestive) heart failure: Secondary | ICD-10-CM | POA: Diagnosis not present

## 2023-08-23 DIAGNOSIS — I5042 Chronic combined systolic (congestive) and diastolic (congestive) heart failure: Secondary | ICD-10-CM | POA: Diagnosis not present

## 2023-08-23 DIAGNOSIS — M6281 Muscle weakness (generalized): Secondary | ICD-10-CM | POA: Diagnosis not present

## 2023-08-23 DIAGNOSIS — J9611 Chronic respiratory failure with hypoxia: Secondary | ICD-10-CM | POA: Diagnosis not present

## 2023-08-24 DIAGNOSIS — N186 End stage renal disease: Secondary | ICD-10-CM | POA: Diagnosis not present

## 2023-08-24 DIAGNOSIS — N179 Acute kidney failure, unspecified: Secondary | ICD-10-CM | POA: Diagnosis not present

## 2023-08-24 DIAGNOSIS — Z992 Dependence on renal dialysis: Secondary | ICD-10-CM | POA: Diagnosis not present

## 2023-08-25 DIAGNOSIS — D689 Coagulation defect, unspecified: Secondary | ICD-10-CM | POA: Diagnosis not present

## 2023-08-25 DIAGNOSIS — N186 End stage renal disease: Secondary | ICD-10-CM | POA: Diagnosis not present

## 2023-08-25 DIAGNOSIS — D631 Anemia in chronic kidney disease: Secondary | ICD-10-CM | POA: Diagnosis not present

## 2023-08-25 DIAGNOSIS — Z992 Dependence on renal dialysis: Secondary | ICD-10-CM | POA: Diagnosis not present

## 2023-08-25 DIAGNOSIS — D509 Iron deficiency anemia, unspecified: Secondary | ICD-10-CM | POA: Diagnosis not present

## 2023-08-25 DIAGNOSIS — N2581 Secondary hyperparathyroidism of renal origin: Secondary | ICD-10-CM | POA: Diagnosis not present

## 2023-08-28 ENCOUNTER — Encounter: Payer: Self-pay | Admitting: Family Medicine

## 2023-08-28 ENCOUNTER — Ambulatory Visit: Admitting: Family Medicine

## 2023-08-28 ENCOUNTER — Other Ambulatory Visit: Payer: Self-pay | Admitting: Family Medicine

## 2023-08-28 VITALS — BP 114/52 | HR 84 | Temp 98.2°F | Ht 65.0 in | Wt 168.0 lb

## 2023-08-28 DIAGNOSIS — S88111D Complete traumatic amputation at level between knee and ankle, right lower leg, subsequent encounter: Secondary | ICD-10-CM

## 2023-08-28 DIAGNOSIS — N186 End stage renal disease: Secondary | ICD-10-CM

## 2023-08-28 DIAGNOSIS — F339 Major depressive disorder, recurrent, unspecified: Secondary | ICD-10-CM | POA: Diagnosis not present

## 2023-08-28 DIAGNOSIS — M5137 Other intervertebral disc degeneration, lumbosacral region with discogenic back pain only: Secondary | ICD-10-CM | POA: Diagnosis not present

## 2023-08-28 DIAGNOSIS — N184 Chronic kidney disease, stage 4 (severe): Secondary | ICD-10-CM

## 2023-08-28 DIAGNOSIS — R600 Localized edema: Secondary | ICD-10-CM

## 2023-08-28 MED ORDER — OXYCODONE-ACETAMINOPHEN 5-325 MG PO TABS: 1.0000 | ORAL_TABLET | Freq: Four times a day (QID) | ORAL | 0 refills | Status: DC | PRN

## 2023-08-28 MED ORDER — CLONAZEPAM 0.5 MG PO TBDP: 0.5000 mg | ORAL_TABLET | Freq: Two times a day (BID) | ORAL | 1 refills | Status: DC

## 2023-08-28 MED ORDER — NYSTATIN 100000 UNIT/GM EX CREA: 1.0000 | TOPICAL_CREAM | Freq: Two times a day (BID) | CUTANEOUS | 3 refills | Status: DC

## 2023-08-28 NOTE — Telephone Encounter (Signed)
 Requested medication (s) are due for refill today: Yes  Requested medication (s) are on the active medication list: Yes ( 2 are on the list), No (2 are not on the list)  Last refill:  Varies   Future visit scheduled: No  Notes to clinic:  2 medications are not on current medication list, Unable to refill per protocol, cannot delegate.      Requested Prescriptions  Pending Prescriptions Disp Refills   methocarbamol (ROBAXIN) 750 MG tablet [Pharmacy Med Name: METHOCARBAMOL 750 MG TABLET] 60 tablet 0    Sig: Take 1 tablet (750 mg total) by mouth every 6 (six) hours as needed for muscle spasms.     Not Delegated - Analgesics:  Muscle Relaxants Failed - 08/28/2023  4:53 PM      Failed - This refill cannot be delegated      Failed - Valid encounter within last 6 months    Recent Outpatient Visits           Today ESRF (end stage renal failure) (HCC)   [place] [facility] Donita Brooks, MD   1 month ago Pneumonia of right lower lobe due to infectious organism   [place] [facility] Donita Brooks, MD   2 months ago Acute bronchitis, unspecified organism   [place] [facility] Bernadette Hoit, MD   6 months ago Uncontrolled type 2 diabetes mellitus with hyperglycemia [facility])   [place] [facility] Donita Brooks, MD   8 months ago Insulin dependent type 2 diabetes mellitus [facility])   [place] [facility] [name], Priscille Heidelberg, MD       Future Appointments             In 2 months [place] Noralyn Pick, MD [facility]             folic acid (FOLVITE) 1 MG tablet [Pharmacy Med Name: FOLIC ACID 1 MG TABLET] 90 tablet 1    Sig: TAKE 1 TABLET BY MOUTH EVERY DAY     Endocrinology:  Vitamins Passed - 08/28/2023  4:53 PM      Passed - Valid encounter within last 12 months    Recent Outpatient Visits           Today ESRF (end stage renal failure)  (HCC)   [place] [facility] Donita Brooks, MD   1 month ago Pneumonia of right lower lobe due to infectious organism   [employer] [facility] Donita Brooks, MD   2 months ago Acute bronchitis, unspecified organism   [place] [facility] Bernadette Hoit, MD   6 months ago Uncontrolled type 2 diabetes mellitus with hyperglycemia [facility])   [place] [facility] Donita Brooks, MD   8 months ago Insulin dependent type 2 diabetes mellitus [facility])   [place] [facility] [name], Priscille Heidelberg, MD       Future Appointments             In 2 months [place] Emms Noralyn Pick, MD [facility] at [facility]             ramelteon (ROZEREM) 8 MG tablet [Pharmacy Med Name: RAMELTEON 8 MG TABLET] 30 tablet 0    Sig: TAKE 1 TABLET BY MOUTH AT BEDTIME.     Psychiatry: Anxiolytics/Hypnotics - Non-controlled Passed - 08/28/2023  4:53 PM      Passed - Valid encounter within last 12 months    Recent Outpatient Visits           Today ESRF (end stage renal failure) (HCC)   [place] [facility] Donita Brooks, MD   1 month ago Pneumonia of right lower lobe due to infectious organism   [place] [facility] Donita Brooks, MD   2 months ago Acute bronchitis, unspecified organism   [place] [facility] Bernadette Hoit, MD   6 months ago Uncontrolled type 2 diabetes mellitus with hyperglycemia [facility])   [place] [facility] Donita Brooks, MD   8 months ago Insulin dependent type 2 diabetes mellitus [facility])   [place] [facility] Medicine [name], Priscille Heidelberg, MD       Future Appointments             In 2 months [place] Emms Noralyn Pick, MD [facility] HeartCare at [facility]             prochlorperazine (COMPAZINE) 5 MG tablet [Pharmacy Med Name: PROCHLORPERAZINE 5 MG TABLET] 60  tablet 0    Sig: TAKE 1 TABLET (5 MG TOTAL) BY MOUTH EVERY 8 HOURS AS NEEDED FOR NAUSEA AND VOMITING     Not Delegated - Gastroenterology: Antiemetics Failed - 08/28/2023  4:53 PM      Failed - This refill cannot be delegated      Failed - Valid encounter within last 6 months    Recent Outpatient Visits           Today ESRF (end stage renal failure) [facility])   [place] [facility] Donita Brooks, MD   1 month ago Pneumonia of right lower lobe due to infectious organism   [place] [facility] Donita Brooks, MD   2 months ago Acute bronchitis, unspecified organism   [place] [facility] Medicine Bernadette Hoit, MD   6 months ago Uncontrolled type 2 diabetes mellitus with hyperglycemia [facility])   [place] [facility] Medicine Donita Brooks, MD   8 months ago Insulin dependent type 2 diabetes mellitus [facility])   [place] [facility] [name], Priscille Heidelberg, MD       Future Appointments             In 2 months [place] Emms, Noralyn Pick, MD [facility] at [facility]

## 2023-08-28 NOTE — Progress Notes (Signed)
 Subjective:  Admit date: 08/05/2023 Discharge date: 08/13/2023   Admitted From: Home Discharge disposition: Home with outpatient PT   Recommendations at discharge:  Cautious use of pain medicines     Brief narrative: Susan [name] is a 73 y.o. female with PMH significant for ESRD-HD-TTS, PAF on Eliquis, CAD status post PCI, chronic systolic CHF with EF 30-35%, DM2, HTN, HLD, hypothyroidism, anemia of CKD, right BKA, and depression/anxiety  3/12, patient  presented to the ER with 24 hours of dysuria associated with abdominal and back pain as well as intermittent shaking chills.  In the ER her blood pressure was low at 76/41 with a temperature of 98.1 WBC of 16.7.  UA was suggestive of UTI.  CT abd/pelvis at presentation noted a small amount of air in the bladder in the proximal left ureter which could be reflective of emphysematous pyelitis Admitted to [facility] with UTI Urine culture and blood culture grew ESBL E. coli   Subjective: Patient was seen and examined this afternoon Pleasant elderly Caucasian female.  Propped up in bed.  On low-flow oxygen. Not in distress. Back pain improving after Percocet given last night.  Patient is agreeable to try as needed Percocet at home.   Hospital course: Complicated ESBL E. coli UTI E. 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 Admitted for UTI.   Started on broad-spectrum IV antibiotics Urine culture and blood culture grew ESBL E. Coli Repeat culture on 3/14 did not show any growth Given persistence of abdominal symptoms, CT abdomen was repeated on 3/16 which did not show any additional acute findings.  It showed persistent gas within the bladder but fortunately no evidence of active pyelonephritis or perinephric abscess and resolution of gas within the proximal left ureter. Clinically making slow but measurable progress.  She was reluctant to try opioids but  last night she was in severe pain and took 1 dose of Percocet.  She felt much better after that.  She is agreeable to try as needed Percocet at home. Completed 7-day course of IV meropenem today Last Labs         Recent Labs  Lab 08/07/23 0628 08/08/23 0253 08/09/23 0528 08/11/23 0345 08/13/23 0434  WBC 11.3* 7.9 7.5 8.0 8.3      ESRD-HD-TTS Nephrology involved. 3/17, right subclavian dialysis catheter was changed out by IR because of bacteremia   Hypokalemia/hypophosphatemia Related to dialysis.  Replacement given Last Labs          Recent Labs  Lab 08/08/23 0253 08/09/23 0528 08/10/23 0336 08/11/23 0345 08/12/23 0345 08/13/23 0434  K 3.7 3.4* 3.8 3.8 3.4* 3.5  MG 2.1  --   --   --   --   --   PHOS 4.3 2.3* 3.4 4.0 2.2* 3.3      Chronic hyponatremia Likely due to dialysis status.  Low but stable sodium level Last Labs           Recent Labs  Lab 08/07/23 0628 08/08/23 0253 08/09/23 0528 08/10/23 0336 08/11/23 0345 08/12/23 0345 08/13/23 0434  NA 132* 127* 133* 127* 128* 130* 129*      Chronic hypotension Chronic systolic CHF Patient has some chronic hypotension which was worsened at admission -blood pressure has now stabilized Chronically on midodrine with dialysis Most recent echo from April 2024 with EF 30 to 35%, moderate to severe LV dysfunction   PAF  Heart rate controlled with amiodarone Chronically anticoagulated with  Eliquis    CAD, HLD Eliquis and statin   Type 2 diabetes mellitus uncontrolled with hyperglycemia A1c 9 on 02/27/2023 PTA meds-Lantus 31 units daily.  Continue same. Last Labs         Recent Labs  Lab 08/12/23 1144 08/12/23 1626 08/12/23 1942 08/13/23 0736 08/13/23 1320  GLUCAP 138* 165* 153* 133* 130*      Anemia of CKD Hemoglobin stable between 9 and 10. Recent Labs (within last 365 days)         Recent Labs    08/07/23 0628 08/08/23 0253 08/09/23 0528 08/11/23 0345 08/13/23 0434  HGB 9.9* 8.8* 9.2* 9.3*  9.1*  MCV 90.4 92.3 90.8 91.5 92.7  FERRITIN 2,289*  --   --   --   --   TIBC 175*  --   --   --   --   IRON 24*  --   --   --   --       Hypothyroidism Continue Synthroid   Depression/anxiety Continue Klonopin nightly, Ativan nightly, melatonin nightly, Effexor daily    08/28/23 Patient was admitted with ESBL E. coli bacteremia with a suspected source of urinary tract infection.  She has completed IV antibiotics in the hospital.  She states that her primary problem at the present time is her inability to sleep and her back pain.  She states for the last 4 to 5 weeks she has been having daily pain in her lower back.  The pain is located roughly at the level of L5 in the center of her back.  On examination today there is no erythema in that area.  There is no bedsore or pressure ulcer.  There is no rash.  She is tender to palpation over the spinous process.  She denies any numbness or tingling radiating down her left leg or into her right thigh.  She denies any fevers or chills.  However the pain keeps her awake at night.  She states it hurts for her to sit.  She has not had any imaging.  In the hospital but did get a CT scan of the abdomen and pelvis.  I reviewed the CT scan and by my untrained I, the patient appears to have anterolisthesis of S1 on L5 with degenerative disc disease between L5-S1.  This is exactly where the patient is having significant pain.  She also has been taking Klonopin twice a day for anxiety and lorazepam at night to help her sleep.  She is also using Robaxin as needed for muscle cramps brought on by dialysis.  She is now using midodrine every day to maintain her blood pressure as she is having hypotensive episodes.  She is also taking 2-3 midodrine a day after dialysis to improve her blood pressure due to hypotension. Past Medical History:  Diagnosis Date   Anemia    hx   Anxiety    Arthritis    "generalized" (03/15/2014)   CAD (coronary artery disease)    MI in 2000  - MI  2007 - treated bare metal stent (no nuclear since then as 9/11)   Carotid artery disease (HCC)    Chronic diastolic heart failure (HCC)    a) ECHO (08/2013) EF 55-60% and RV function nl b) RHC (08/2013) RA 4, RV 30/5/7, PA 25/10 (16), PCWP 7, Fick CO/CI 6.3/2.7, PVR 1.5 WU, PA 61 and 66%   Daily headache    "~ every other day; since I fell in June" (03/15/2014)   Depression  Diabetic retinopathy (HCC)    Dyslipidemia    ESRD (end stage renal disease) (HCC)    Dialysis on Tues Thurs Sat   Exertional shortness of breath    GERD (gastroesophageal reflux disease)    History of blood transfusion    History of kidney stones    HTN (hypertension)    Hypothyroidism    Myocardial infarction (HCC)    Obesity    Osteoarthritis    PAF (paroxysmal atrial fibrillation) (HCC)    Peripheral neuropathy    bilateral feet/hands   PONV (postoperative nausea and vomiting)    RBBB (right bundle branch block)    Old   Stroke (HCC)    mini strokes   Type II diabetes mellitus (HCC)    Type II, [name]. patient has omnipod insulin pump with Novolin R Insulin   Past Surgical History:  Procedure Laterality Date   A/V FISTULAGRAM Left 11/07/2022   Procedure: A/V Fistulagram;  Surgeon: Leonie Douglas, MD;  [facility]: [facility];  Service: Cardiovascular;  Laterality: Left;   ABDOMINAL HYSTERECTOMY  1980's   AMPUTATION Right 02/24/2018   Procedure: RIGHT FOOT GREAT TOE AND 2ND TOE AMPUTATION;  Surgeon: Nadara Mustard, MD;  Location: [facility] OR;  Service: Orthopedics;  Laterality: Right;   AMPUTATION Right 04/30/2018   Procedure: RIGHT TRANSMETATARSAL AMPUTATION;  Surgeon: Nadara Mustard, MD;  Location: [facility] OR;  Service: Orthopedics;  Laterality: Right;   AMPUTATION Right 05/02/2022   Procedure: RIGHT BELOW KNEE AMPUTATION;  Surgeon: Nadara Mustard, MD;  Location: [facility] OR;  Service: Orthopedics;  Laterality: Right;   APPLICATION OF WOUND VAC Right 06/13/2022   Procedure:  APPLICATION OF WOUND VAC;  Surgeon: Nadara Mustard, MD;  Location: [facility] OR;  Service: Orthopedics;  Laterality: Right;   APPLICATION OF WOUND VAC Left 11/14/2022   Procedure: APPLICATION OF WOUND VAC;  Surgeon: Nadara Mustard, MD;  Location: [facility];  Service: Orthopedics;  Laterality: Left;   AV FISTULA PLACEMENT Left 04/02/2022   Procedure: LEFT ARM ARTERIOVENOUS (AV) FISTULA CREATION;  Surgeon: Nada Libman, MD;  Location: [facility];  Service: Vascular;  Laterality: Left;  PERIPHERAL NERVE BLOCK   AV FISTULA PLACEMENT Right 04/27/2023   Procedure: RIGHT ARM BRACHIOBASILIC ARTERIOVENOUS (AV) FISTULA CREATION;  Surgeon: Leonie Douglas, MD;  Location: [facility];  Service: Vascular;  Laterality: Right;   BASCILIC VEIN TRANSPOSITION Left 07/31/2022   Procedure: LEFT ARM SECOND STAGE BASILIC VEIN TRANSPOSITION;  Surgeon: Nada Libman, MD;  [facility]: [facility];  Service: Vascular;  Laterality: Left;   BIOPSY  05/27/2020   Procedure: BIOPSY;  Surgeon: Lanelle Bal, DO;  [facility]: [facility];  Service: Endoscopy;;   CATARACT EXTRACTION, BILATERAL Bilateral ?2013   COLONOSCOPY W/ POLYPECTOMY     COLONOSCOPY WITH PROPOFOL N/A 03/13/2019   Procedure: COLONOSCOPY WITH PROPOFOL;  Surgeon: Beverley Fiedler, MD;  Location: [facility] ENDOSCOPY;  Service: Gastroenterology;  Laterality: N/A;   CORONARY ANGIOPLASTY WITH STENT PLACEMENT  1999; 2007   "1 + 1"   ERCP N/A 02/03/2022   Procedure: ENDOSCOPIC RETROGRADE CHOLANGIOPANCREATOGRAPHY (ERCP);  Surgeon: Jeani Hawking, MD;  Location: [facility] ENDOSCOPY;  Service: Gastroenterology;  Laterality: N/A;   ESOPHAGOGASTRODUODENOSCOPY N/A 02/12/2023   Procedure: ESOPHAGOGASTRODUODENOSCOPY (EGD);  Surgeon: Jeani Hawking, MD;  Location: [facility] ENDOSCOPY;  Service: Gastroenterology;  Laterality: N/A;   ESOPHAGOGASTRODUODENOSCOPY (EGD) WITH PROPOFOL N/A 03/13/2019   Procedure: ESOPHAGOGASTRODUODENOSCOPY (EGD) WITH PROPOFOL;  Surgeon: Beverley Fiedler, MD;  [facility];  Service:  Gastroenterology;  Laterality: N/A;   ESOPHAGOGASTRODUODENOSCOPY (EGD) WITH PROPOFOL N/A 05/27/2020   Procedure: ESOPHAGOGASTRODUODENOSCOPY (EGD) WITH PROPOFOL;  Surgeon: Lanelle Bal, DO;  [facility];  Service: Endoscopy;  Laterality: N/A;   ESOPHAGOGASTRODUODENOSCOPY (EGD) WITH PROPOFOL N/A 09/03/2022   Procedure: ESOPHAGOGASTRODUODENOSCOPY (EGD) WITH PROPOFOL;  Surgeon: Jeani Hawking, MD;  Location: [facility] ENDOSCOPY;  Service: Gastroenterology;  Laterality: N/A;   EYE SURGERY Bilateral    lazer   FLEXIBLE SIGMOIDOSCOPY N/A 05/23/2022   Procedure: FLEXIBLE SIGMOIDOSCOPY;  Surgeon: Jeani Hawking, MD;  Location: [facility] ENDOSCOPY;  Service: Gastroenterology;  Laterality: N/A;   FLEXIBLE SIGMOIDOSCOPY N/A 05/24/2022   Procedure: FLEXIBLE SIGMOIDOSCOPY;  Surgeon: Imogene Burn, MD;  Location: [facility] ENDOSCOPY;  Service: Gastroenterology;  Laterality: N/A;   FLEXIBLE SIGMOIDOSCOPY N/A 09/03/2022   Procedure: FLEXIBLE SIGMOIDOSCOPY;  Surgeon: Jeani Hawking, MD;  Location: [facility] ENDOSCOPY;  Service: Gastroenterology;  Laterality: N/A;   HEMOSTASIS CLIP PLACEMENT  03/13/2019   Procedure: HEMOSTASIS CLIP PLACEMENT;  Surgeon: Beverley Fiedler, MD;  Location: [facility] ENDOSCOPY;  Service: Gastroenterology;;   HEMOSTASIS CLIP PLACEMENT  05/23/2022   Procedure: HEMOSTASIS CLIP PLACEMENT;  Surgeon: Jeani Hawking, MD;  Location: [facility] ENDOSCOPY;  Service: Gastroenterology;;   HEMOSTASIS CONTROL  05/24/2022   Procedure: HEMOSTASIS CONTROL;  Surgeon: Imogene Burn, MD;  Location: [facility] ENDOSCOPY;  Service: Gastroenterology;;   HOT HEMOSTASIS N/A 05/23/2022   Procedure: HOT HEMOSTASIS (ARGON PLASMA COAGULATION/BICAP);  Surgeon: Jeani Hawking, MD;  Location: [facility] ENDOSCOPY;  Service: Gastroenterology;  Laterality: N/A;   I & D EXTREMITY Left 05/05/2022   Procedure: IRRIGATION AND DEBRIDEMENT LEFT ARM AV FISTULA;  Surgeon: Cephus Shelling, MD;  Location: [facility] OR;  Service: Vascular;  Laterality: Left;   I & D EXTREMITY N/A  11/14/2022   Procedure: IRRIGATION AND DEBRIDEMENT OF LOWER EXTREMITY WOUND;  Surgeon: Nadara Mustard, MD;  Location: [facility] OR;  Service: Orthopedics;  Laterality: N/A;   INSERTION OF DIALYSIS CATHETER Right 04/02/2022   Procedure: INSERTION OF TUNNELED DIALYSIS CATHETER;  Surgeon: Nada Libman, MD;  Location[facility] OR;  Service: Vascular;  Laterality: Right;   IR FLUORO GUIDE CV LINE RIGHT  08/10/2023   IR VENOCAVAGRAM IVC  08/10/2023   KNEE ARTHROSCOPY Left 10/25/2006   POLYPECTOMY  03/13/2019   Procedure: POLYPECTOMY;  Surgeon: Beverley Fiedler, MD;  Location: [facility] ENDOSCOPY;  Service: Gastroenterology;;   REMOVAL OF STONES  02/03/2022   Procedure: REMOVAL OF STONES;  Surgeon: Jeani Hawking, MD;  Location: [facility] ENDOSCOPY;  Service: Gastroenterology;;   REVISON OF ARTERIOVENOUS FISTULA Left 08/20/2022   Procedure: REVISON OF LEFT ARM ARTERIOVENOUS FISTULA;  Surgeon: Nada Libman, MD;  [facility] OR;  Service: Vascular;  Laterality: Left;   RIGHT HEART CATH N/A 07/24/2017   Procedure: RIGHT HEART CATH;  Surgeon: Dolores Patty, MD;  [facility];  Service: Cardiovascular;  Laterality: N/A;   RIGHT HEART CATHETERIZATION N/A 09/22/2013   Procedure: RIGHT HEART CATH;  Surgeon: Dolores Patty, MD;  Location: [facility] CATH LAB;  Service: Cardiovascular;  Laterality: N/A;   SHOULDER ARTHROSCOPY WITH OPEN ROTATOR CUFF REPAIR Right 03/14/2014   Procedure: RIGHT SHOULDER ARTHROSCOPY WITH BICEPS RELEASE, OPEN SUBSCAPULA REPAIR, OPEN SUPRASPINATUS REPAIR.;  Surgeon: Cammy Copa, MD;  Location: [facility] OR;  Service: Orthopedics;  Laterality: Right;   SPHINCTEROTOMY  02/03/2022   Procedure: SPHINCTEROTOMY;  Surgeon: Jeani Hawking, MD;  Location: [facility] ENDOSCOPY;  Service: Gastroenterology;;   STUMP REVISION Right 06/13/2022   Procedure: REVISION RIGHT BELOW  KNEE AMPUTATION;  Surgeon: Nadara Mustard, MD;  Location: [facility] OR;  Service: Orthopedics;  Laterality: Right;   TEE WITHOUT CARDIOVERSION N/A  02/04/2022   Procedure: TRANSESOPHAGEAL ECHOCARDIOGRAM (TEE);  Surgeon: Dolores Patty, MD;  Location: [facility] ENDOSCOPY;  Service: Cardiovascular;  Laterality: N/A;   THROMBECTOMY W/ EMBOLECTOMY Left 08/20/2022   Procedure: THROMBECTOMY OF LEFT ARM ARTERIOVENOUS FISTULA;  Surgeon: Nada Libman, MD;  Location: [facility] OR;  Service: Vascular;  Laterality: Left;   TOE AMPUTATION Right 02/24/2018   GREAT TOE AND 2ND TOE AMPUTATION   TUBAL LIGATION  1970's   UPPER EXTREMITY VENOGRAPHY N/A 07/31/2023   Procedure: UPPER EXTREMITY VENOGRAPHY;  Surgeon: Leonie Douglas, MD;  [facility];  Service: Cardiovascular;  Laterality: N/A;   Current Outpatient Medications on File Prior to Visit  Medication Sig Dispense Refill   acetaminophen (TYLENOL) 325 MG tablet Take 2 tablets (650 mg total) by mouth every 6 (six) hours as needed for mild pain (or Fever >/= 101).     albuterol (VENTOLIN HFA) 108 (90 Base) MCG/ACT inhaler Inhale 1-2 puffs into the lungs every 6 (six) hours as needed for shortness of breath. 1 each 3   amiodarone (PACERONE) 200 MG tablet Take 1 tablet (200 mg total) by mouth daily. 90 tablet 3   apixaban (ELIQUIS) 5 MG TABS tablet Take 1 tablet (5 mg total) by mouth 2 (two) times daily. 180 tablet 1   ascorbic acid (VITAMIN C) 500 MG tablet Take 1 tablet (500 mg total) by mouth daily. 100 tablet 0   atorvastatin (LIPITOR) 80 MG tablet Take 1 tablet (80 mg total) by mouth daily. 90 tablet 1   calcitRIOL (ROCALTROL) 0.25 MCG capsule Take 9 capsules (2.25 mcg total) by mouth Every Tuesday,Thursday,and Saturday with dialysis. 30 capsule 0   clonazePAM (KLONOPIN) 0.5 MG disintegrating tablet Take 1 tablet (0.5 mg total) by mouth 2 (two) times daily. Stop lorazepam. 60 tablet 1   folic acid (FOLVITE) 1 MG tablet Take 1 mg by mouth daily.     folic acid-vitamin b complex-vitamin c-selenium-zinc (DIALYVITE) 3 MG TABS tablet Take 1 tablet by mouth daily.     insulin aspart (NOVOLOG) 100  UNIT/ML injection Inject 0-15 Units into the skin 3 (three) times daily with meals. CBG < 70: treat low blood sugar, CBG 70 - 120: 0 units, CBG 121 - 150: 2 units, CBG 151 - 200: 3 units, CBG 201 - 250: 5 units, CBG 251 - 300: 8 units, CBG 301 - 350: 11 units, CBG 351 - 400: 15 units, CBG > 400: call MD 60 mL 5   insulin glargine (LANTUS) 100 UNIT/ML Solostar Pen Inject 31 Units into the skin every morning. 15 mL 3   levothyroxine (SYNTHROID) 50 MCG tablet Take 1 tablet (50 mcg total) by mouth daily before breakfast. 90 tablet 1   melatonin 3 MG TABS tablet Take 3 mg by mouth at bedtime.     methocarbamol (ROBAXIN) 750 MG tablet Take 1 tablet (750 mg total) by mouth every 6 (six) hours as needed for muscle spasms. 60 tablet 0   Methoxy PEG-Epoetin Beta (MIRCERA IJ) Mircera     midodrine (PROAMATINE) 10 MG tablet Take 1-2 tablets (10-20 mg total) by mouth Every Tuesday,Thursday,and Saturday with dialysis. 30 tablet 1   nitroGLYCERIN (NITROSTAT) 0.4 MG SL tablet Place 1 tablet (0.4 mg total) under the tongue every 5 (five) minutes as needed. 25 tablet 3   oxyCODONE-acetaminophen (PERCOCET/ROXICET) 5-325 MG tablet Take  1 tablet by mouth every 6 (six) hours as needed for moderate pain (pain score 4-6). 30 tablet 0   OXYGEN Inhale 2 L into the lungs at bedtime.     pantoprazole (PROTONIX) 40 MG tablet Take 1 tablet (40 mg total) by mouth daily. 180 tablet 1   polyethylene glycol (MIRALAX / GLYCOLAX) 17 g packet Take 17 g by mouth daily. (Patient taking differently: Take 17 g by mouth daily as needed for mild constipation or moderate constipation.)     sevelamer carbonate (RENVELA) 800 MG tablet Take 1,600 mg by mouth 3 (three) times daily with meals.     triamcinolone cream (KENALOG) 0.1 % Apply 1 Application topically 2 (two) times daily. Apply to affected areas BID (Patient taking differently: Apply 1 Application topically at bedtime.) 45 g 1   venlafaxine XR (EFFEXOR-XR) 75 MG 24 hr capsule Take 1  capsule (75 mg total) by mouth daily with breakfast. Stop trintellix 90 capsule 1   Zinc 30 MG CAPS Take 30 mg by mouth daily.     No current facility-administered medications on file prior to visit.     Allergies  Allergen Reactions   Cephalexin Diarrhea and Other (See Comments)   Codeine Nausea And Vomiting and Other (See Comments)   Social History   Socioeconomic History   Marital status: Married    Spouse name: Ramon Dredge   Number of children: 3   Years of education: 12th   Highest education level: Not on file  Occupational History    Employer: UNEMPLOYED  Tobacco Use   Smoking status: Former    Current packs/day: 0.00    Average packs/day: 3.0 packs/day for 32.0 years (96.0 ttl pk-yrs)    Types: Cigarettes    Start date: 10/24/1965    Quit date: 10/24/1997    Years since quitting: 25.8   Smokeless tobacco: Never  Vaping Use   Vaping status: Never Used  Substance and Sexual Activity   Alcohol use: Not Currently   Drug use: No   Sexual activity: Not Currently    Birth control/protection: Surgical    Comment: Hysterectomy  Other Topics Concern   Not on file  Social History Narrative   Pt lives at home with her spouse.Caffeine Use- 3 sodas daily.    Social Drivers of Corporate investment banker Strain: Low Risk  (08/16/2021)   Overall Financial Resource Strain (CARDIA)    Difficulty of Paying Living Expenses: Not hard at all  Food Insecurity: No Food Insecurity (08/06/2023)   Hunger Vital Sign    Worried About Running Out of Food in the Last Year: Never true    Ran Out of Food in the Last Year: Never true  Transportation Needs: No Transportation Needs (08/06/2023)   PRAPARE - Administrator, Civil Service (Medical): No    Lack of Transportation (Non-Medical): No  Physical Activity: Inactive (08/16/2021)   Exercise Vital Sign    Days of Exercise per Week: 0 days    Minutes of Exercise per Session: 0 min  Stress: No Stress Concern Present (08/16/2021)    [employer] of Occupational Health - Occupational Stress Questionnaire    Feeling of Stress : Not at all  Social Connections: Moderately Isolated (08/06/2023)   Social Connection and Isolation Panel [NHANES]    Frequency of Communication with Friends and Family: More than three times a week    Frequency of Social Gatherings with Friends and Family: More than three times a week  Attends Religious Services: Never    Active Member of Clubs or Organizations: No    Attends Banker Meetings: Never    Marital Status: Married  Catering manager Violence: Not At Risk (08/06/2023)   Humiliation, Afraid, Rape, and Kick questionnaire    Fear of Current or Ex-Partner: No    Emotionally Abused: No    Physically Abused: No    Sexually Abused: No     Review of Systems  Gastrointestinal:  Positive for diarrhea.       Objective:   Physical Exam Constitutional:      General: She is not in acute distress.    Appearance: Normal appearance. She is obese. She is not ill-appearing or toxic-appearing.  Cardiovascular:     Rate and Rhythm: Normal rate and regular rhythm.     Heart sounds: Normal heart sounds. No murmur heard.    No friction rub. No gallop.  Pulmonary:     Effort: Pulmonary effort is normal. No respiratory distress.     Breath sounds: No stridor. No wheezing, rhonchi or rales.    Abdominal:     General: Bowel sounds are normal.     Palpations: Abdomen is soft.     Tenderness: There is no abdominal tenderness.  Musculoskeletal:        General: Tenderness present. No deformity or signs of injury.     Lumbar back: Tenderness and bony tenderness present. No deformity or spasms. Decreased range of motion. Negative right straight leg raise test.       Back:     Left lower leg: No edema.  Skin:    General: Skin is warm.  Neurological:     Mental Status: She is alert.        Assessment & Plan:  ESRF (end stage renal failure) (HCC)  Depression, recurrent  (HCC) - Plan: clonazePAM (KLONOPIN) 0.5 MG disintegrating tablet  Degeneration of intervertebral disc of lumbosacral region with discogenic back pain Patient's prognosis is very guarded.  She is getting progressively weaker and sicker.  She is having a difficult time tolerating dialysis due to hypotension.  They are now having to supplement with midodrine to maintain adequate blood pressure.  Therefore, I am very concerned that her life expectancy is likely less than 6 months.  She is now dealing with severe lower back pain.  I do not see any suspicious lesions on the CAT scan.  I believe it is discogenic back pain due to degenerative disc disease between L5 and S1.  I will give her oxycodone/acetaminophen 5/325.  She can take 1 tablet every 6 hours as needed for pain.  I have asked her to try to make 60 tablets last 1 month and not overuse the pain medication.  I am concerned about polypharmacy and accidental overdose.  Therefore I recommended that we discontinue lorazepam and just continue Klonopin twice daily especially since she continues to take methocarbamol.  Worried that the patient will have too many sedating medicines that may cause delirium.

## 2023-09-01 DIAGNOSIS — Z992 Dependence on renal dialysis: Secondary | ICD-10-CM | POA: Diagnosis not present

## 2023-09-01 DIAGNOSIS — N2581 Secondary hyperparathyroidism of renal origin: Secondary | ICD-10-CM | POA: Diagnosis not present

## 2023-09-01 DIAGNOSIS — D689 Coagulation defect, unspecified: Secondary | ICD-10-CM | POA: Diagnosis not present

## 2023-09-01 DIAGNOSIS — D509 Iron deficiency anemia, unspecified: Secondary | ICD-10-CM | POA: Diagnosis not present

## 2023-09-01 DIAGNOSIS — N186 End stage renal disease: Secondary | ICD-10-CM | POA: Diagnosis not present

## 2023-09-07 ENCOUNTER — Ambulatory Visit: Admitting: Physical Therapy

## 2023-09-07 ENCOUNTER — Encounter: Payer: Self-pay | Admitting: Physical Therapy

## 2023-09-07 DIAGNOSIS — R2681 Unsteadiness on feet: Secondary | ICD-10-CM

## 2023-09-07 DIAGNOSIS — R2689 Other abnormalities of gait and mobility: Secondary | ICD-10-CM | POA: Diagnosis not present

## 2023-09-07 DIAGNOSIS — Z7409 Other reduced mobility: Secondary | ICD-10-CM | POA: Diagnosis not present

## 2023-09-07 DIAGNOSIS — M6281 Muscle weakness (generalized): Secondary | ICD-10-CM | POA: Diagnosis not present

## 2023-09-07 NOTE — Therapy (Signed)
 OUTPATIENT PHYSICAL THERAPY PROSTHETIC TREATMENT    Patient Name: Susan [name] MRN: [id number] DOB:June 01, 1950, 73 y.o., female Today's Date: 09/07/2023  PCP: Donita Brooks, MD  REFERRING PROVIDER:  Aldean Baker, MD  END OF SESSION:  PT End of Session - 09/07/23 1429     Visit Number 35    Number of Visits 44    Date for PT Re-Evaluation 09/10/23    Authorization Type [employer]    Progress Note Due on Visit 40    PT Start Time 1425    PT Stop Time 1515    PT Time Calculation (min) 50 min    Equipment Utilized During Treatment Gait belt    Activity Tolerance Patient tolerated treatment well;Patient limited by fatigue    Behavior During Therapy WFL for tasks assessed/performed                 Past Medical History:  Diagnosis Date   Anemia    hx   Anxiety    Arthritis    "generalized" (03/15/2014)   CAD (coronary artery disease)    MI in 2000 - MI  2007 - treated bare metal stent (no nuclear since then as 9/11)   Carotid artery disease (HCC)    Chronic diastolic heart failure (HCC)    a) ECHO (08/2013) EF 55-60% and RV function nl b) RHC (08/2013) RA 4, RV 30/5/7, PA 25/10 (16), PCWP 7, Fick CO/CI 6.3/2.7, PVR 1.5 WU, PA 61 and 66%   Daily headache    "~ every other day; since I fell in June" (03/15/2014)   Depression    Diabetic retinopathy (HCC)    Dyslipidemia    ESRD (end stage renal disease) (HCC)    Dialysis on Tues Thurs Sat   Exertional shortness of breath    GERD (gastroesophageal reflux disease)    History of blood transfusion    History of kidney stones    HTN (hypertension)    Hypothyroidism    Myocardial infarction (HCC)    Obesity    Osteoarthritis    PAF (paroxysmal atrial fibrillation) (HCC)    Peripheral neuropathy    bilateral feet/hands   PONV (postoperative nausea and vomiting)    RBBB (right bundle branch block)    Old   Stroke (HCC)    mini strokes   Type II diabetes mellitus (HCC)    Type II, [name]  left upper arm. patient has omnipod insulin pump with Novolin R Insulin   Past Surgical History:  Procedure Laterality Date   A/V FISTULAGRAM Left 11/07/2022   Procedure: A/V Fistulagram;  Surgeon: Leonie Douglas, MD;  [facility];  Service: Cardiovascular;  Laterality: Left;   ABDOMINAL HYSTERECTOMY  1980's   AMPUTATION Right 02/24/2018   Procedure: RIGHT FOOT GREAT TOE AND 2ND TOE AMPUTATION;  Surgeon: Nadara Mustard, MD;  Location: [facility] OR;  Service: Orthopedics;  Laterality: Right;   AMPUTATION Right 04/30/2018   Procedure: RIGHT TRANSMETATARSAL AMPUTATION;  Surgeon: Nadara Mustard, MD;  Location: [facility] OR;  Service: Orthopedics;  Laterality: Right;   AMPUTATION Right 05/02/2022   Procedure: RIGHT BELOW KNEE AMPUTATION;  Surgeon: Nadara Mustard, MD;  Location: [facility] OR;  Service: Orthopedics;  Laterality: Right;   APPLICATION OF WOUND VAC Right 06/13/2022   Procedure: APPLICATION OF WOUND VAC;  Surgeon: Nadara Mustard, MD;  Location: [facility] OR;  Service: Orthopedics;  Laterality: Right;   APPLICATION OF WOUND VAC Left 11/14/2022   Procedure:  APPLICATION OF WOUND VAC;  Surgeon: Timothy Ford, MD;  Location: [facility] OR;  Service: Orthopedics;  Laterality: Left;   AV FISTULA PLACEMENT Left 04/02/2022   Procedure: LEFT ARM ARTERIOVENOUS (AV) FISTULA CREATION;  Surgeon: Margherita Shell, MD;  Location: [facility] OR;  Service: Vascular;  Laterality: Left;  PERIPHERAL NERVE BLOCK   AV FISTULA PLACEMENT Right 04/27/2023   Procedure: RIGHT ARM BRACHIOBASILIC ARTERIOVENOUS (AV) FISTULA CREATION;  Surgeon: Carlene Che, MD;  [facility]: [facility] OR;  Service: Vascular;  Laterality: Right;   BASCILIC VEIN TRANSPOSITION Left 07/31/2022   Procedure: LEFT ARM SECOND STAGE BASILIC VEIN TRANSPOSITION;  Surgeon: Margherita Shell, MD;  [facility]: [facility] OR;  Service: Vascular;  Laterality: Left;   BIOPSY  05/27/2020   Procedure: BIOPSY;  Surgeon: Vinetta Greening, DO;  [facility]: [facility];  Service: Endoscopy;;   CATARACT  EXTRACTION, BILATERAL Bilateral ?2013   COLONOSCOPY W/ POLYPECTOMY     COLONOSCOPY WITH PROPOFOL N/A 03/13/2019   Procedure: COLONOSCOPY WITH PROPOFOL;  Surgeon: Nannette Babe, MD;  Location: [facility] ENDOSCOPY;  Service: Gastroenterology;  Laterality: N/A;   CORONARY ANGIOPLASTY WITH STENT PLACEMENT  1999; 2007   "1 + 1"   ERCP N/A 02/03/2022   Procedure: ENDOSCOPIC RETROGRADE CHOLANGIOPANCREATOGRAPHY (ERCP);  Surgeon: Alvis Jourdain, MD;  Location: [facility] ENDOSCOPY;  Service: Gastroenterology;  Laterality: N/A;   ESOPHAGOGASTRODUODENOSCOPY N/A 02/12/2023   Procedure: ESOPHAGOGASTRODUODENOSCOPY (EGD);  Surgeon: Alvis Jourdain, MD;  Location: [facility] ENDOSCOPY;  Service: Gastroenterology;  Laterality: N/A;   ESOPHAGOGASTRODUODENOSCOPY (EGD) WITH PROPOFOL N/A 03/13/2019   Procedure: ESOPHAGOGASTRODUODENOSCOPY (EGD) WITH PROPOFOL;  Surgeon: Nannette Babe, MD;  Location: [facility] ENDOSCOPY;  Service: Gastroenterology;  Laterality: N/A;   ESOPHAGOGASTRODUODENOSCOPY (EGD) WITH PROPOFOL N/A 05/27/2020   Procedure: ESOPHAGOGASTRODUODENOSCOPY (EGD) WITH PROPOFOL;  Surgeon: Vinetta Greening, DO;  [facility];  Service: Endoscopy;  Laterality: N/A;   ESOPHAGOGASTRODUODENOSCOPY (EGD) WITH PROPOFOL N/A 09/03/2022   Procedure: ESOPHAGOGASTRODUODENOSCOPY (EGD) WITH PROPOFOL;  Surgeon: Alvis Jourdain, MD;  Location: [facility] ENDOSCOPY;  Service: Gastroenterology;  Laterality: N/A;   EYE SURGERY Bilateral    lazer   FLEXIBLE SIGMOIDOSCOPY N/A 05/23/2022   Procedure: FLEXIBLE SIGMOIDOSCOPY;  Surgeon: Alvis Jourdain, MD;  Location: [facility] ENDOSCOPY;  Service: Gastroenterology;  Laterality: N/A;   FLEXIBLE SIGMOIDOSCOPY N/A 05/24/2022   Procedure: FLEXIBLE SIGMOIDOSCOPY;  Surgeon: Daina Drum, MD;  Location: [facility] ENDOSCOPY;  Service: Gastroenterology;  Laterality: N/A;   FLEXIBLE SIGMOIDOSCOPY N/A 09/03/2022   Procedure: FLEXIBLE SIGMOIDOSCOPY;  Surgeon: Alvis Jourdain, MD;  Location: [facility] ENDOSCOPY;  Service: Gastroenterology;  Laterality:  N/A;   HEMOSTASIS CLIP PLACEMENT  03/13/2019   Procedure: HEMOSTASIS CLIP PLACEMENT;  Surgeon: Nannette Babe, MD;  Location: [facility] ENDOSCOPY;  Service: Gastroenterology;;   HEMOSTASIS CLIP PLACEMENT  05/23/2022   Procedure: HEMOSTASIS CLIP PLACEMENT;  Surgeon: Alvis Jourdain, MD;  Location: [facility] ENDOSCOPY;  Service: Gastroenterology;;   HEMOSTASIS CONTROL  05/24/2022   Procedure: HEMOSTASIS CONTROL;  Surgeon: Daina Drum, MD;  Location: [facility] ENDOSCOPY;  Service: Gastroenterology;;   HOT HEMOSTASIS N/A 05/23/2022   Procedure: HOT HEMOSTASIS (ARGON PLASMA COAGULATION/BICAP);  Surgeon: Alvis Jourdain, MD;  Location: [facility] ENDOSCOPY;  Service: Gastroenterology;  Laterality: N/A;   I & D EXTREMITY Left 05/05/2022   Procedure: IRRIGATION AND DEBRIDEMENT LEFT ARM AV FISTULA;  Surgeon: Young Hensen, MD;  Location: [facility] OR;  Service: Vascular;  Laterality: Left;   I & D EXTREMITY N/A 11/14/2022   Procedure: IRRIGATION AND DEBRIDEMENT OF LOWER EXTREMITY WOUND;  Surgeon: Timothy Ford, MD;  [facility];  Service: Orthopedics;  Laterality: N/A;   INSERTION OF DIALYSIS CATHETER Right 04/02/2022   Procedure: INSERTION OF TUNNELED DIALYSIS CATHETER;  Surgeon: Nada Libman, MD;  [facility];  Service: Vascular;  Laterality: Right;   IR FLUORO GUIDE CV LINE RIGHT  08/10/2023   IR VENOCAVAGRAM IVC  08/10/2023   KNEE ARTHROSCOPY Left 10/25/2006   POLYPECTOMY  03/13/2019   Procedure: POLYPECTOMY;  Surgeon: Beverley Fiedler, MD;  Location: [facility] ENDOSCOPY;  Service: Gastroenterology;;   REMOVAL OF STONES  02/03/2022   Procedure: REMOVAL OF STONES;  Surgeon: Jeani Hawking, MD;  Location: [facility] ENDOSCOPY;  Service: Gastroenterology;;   REVISON OF ARTERIOVENOUS FISTULA Left 08/20/2022   Procedure: REVISON OF LEFT ARM ARTERIOVENOUS FISTULA;  Surgeon: Nada Libman, MD;  [facility];  Service: Vascular;  Laterality: Left;   RIGHT HEART CATH N/A 07/24/2017   Procedure: RIGHT HEART CATH;  Surgeon: Dolores Patty,  MD;  [facility];  Service: Cardiovascular;  Laterality: N/A;   RIGHT HEART CATHETERIZATION N/A 09/22/2013   Procedure: RIGHT HEART CATH;  Surgeon: Dolores Patty, MD;  Location: [facility] CATH LAB;  Service: Cardiovascular;  Laterality: N/A;   SHOULDER ARTHROSCOPY WITH OPEN ROTATOR CUFF REPAIR Right 03/14/2014   Procedure: RIGHT SHOULDER ARTHROSCOPY WITH BICEPS RELEASE, OPEN SUBSCAPULA REPAIR, OPEN SUPRASPINATUS REPAIR.;  Surgeon: Cammy Copa, MD;  Location: [facility] OR;  Service: Orthopedics;  Laterality: Right;   SPHINCTEROTOMY  02/03/2022   Procedure: SPHINCTEROTOMY;  Surgeon: Jeani Hawking, MD;  Location: [facility] ENDOSCOPY;  Service: Gastroenterology;;   STUMP REVISION Right 06/13/2022   Procedure: REVISION RIGHT BELOW KNEE AMPUTATION;  Surgeon: Nadara Mustard, MD;  Location: [facility] OR;  Service: Orthopedics;  Laterality: Right;   TEE WITHOUT CARDIOVERSION N/A 02/04/2022   Procedure: TRANSESOPHAGEAL ECHOCARDIOGRAM (TEE);  Surgeon: Dolores Patty, MD;  Location: [facility] ENDOSCOPY;  Service: Cardiovascular;  Laterality: N/A;   THROMBECTOMY W/ EMBOLECTOMY Left 08/20/2022   Procedure: THROMBECTOMY OF LEFT ARM ARTERIOVENOUS FISTULA;  Surgeon: Nada Libman, MD;  Location: [facility] OR;  Service: Vascular;  Laterality: Left;   TOE AMPUTATION Right 02/24/2018   GREAT TOE AND 2ND TOE AMPUTATION   TUBAL LIGATION  1970's   UPPER EXTREMITY VENOGRAPHY N/A 07/31/2023   Procedure: UPPER EXTREMITY VENOGRAPHY;  Surgeon: Leonie Douglas, MD;  [facility];  Service: Cardiovascular;  Laterality: N/A;   Patient Active Problem List   Diagnosis Date Noted   Pyelonephritis 08/06/2023   Complicated UTI (urinary tract infection) 08/05/2023   Atrial fibrillation (HCC) 06/17/2023   Atopic dermatitis 06/17/2023   Hyperosmolar hyperglycemic state (HHS) (HCC) 02/05/2023   Gastroparesis 02/05/2023   Depression 01/29/2023   Intractable nausea and vomiting 01/20/2023   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 difficile colitis 06/05/2022   Spleen hematoma 06/05/2022   Dehiscence of amputation stump of right lower extremity (HCC) 06/05/2022   Rectal ulcer 05/27/2022   ESRD (end stage renal disease) (HCC) 05/27/2022   GI  bleed 05/23/2022   Difficult intravenous access 05/23/2022   Gangrene of right foot (HCC) 05/02/2022   S/P BKA (below knee amputation) unilateral, right (HCC) 05/02/2022   Unspecified protein-calorie malnutrition (HCC) 04/15/2022   Secondary hyperparathyroidism of renal origin (HCC) 04/14/2022   Coagulation defect, unspecified (HCC) 04/09/2022   Acquired absence of other left toe(s) (HCC) 04/07/2022   Allergy, unspecified, initial encounter 04/07/2022   Dependence on renal dialysis (HCC) 04/07/2022   Gout due to renal impairment, unspecified site 04/07/2022   Hypertensive heart and chronic kidney disease with heart failure and with stage 5 chronic kidney disease, or end stage renal disease (HCC) 04/07/2022   Personal history of transient ischemic attack (TIA), and cerebral infarction without residual deficits 04/07/2022   Renal osteodystrophy 04/07/2022   Venous stasis ulcer of right calf (HCC) 03/31/2022   Fistula, colovaginal 03/26/2022   Diarrhea 03/26/2022   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   Sepsis without acute organ dysfunction (HCC)    Bacteremia    Acute pancreatitis 02/01/2022   Abdominal pain  02/01/2022   SIRS (systemic inflammatory response syndrome) (HCC) 02/01/2022   Transaminitis 02/01/2022   History of anemia due to chronic kidney disease 02/01/2022   Paroxysmal atrial fibrillation (HCC) 02/01/2022   Uncontrolled type 2 diabetes mellitus with hyperglycemia, with long-term current use of insulin (HCC) 01/14/2022   NSTEMI (non-ST elevated myocardial infarction) (HCC) 03/05/2021   Acute renal failure superimposed on stage 4 chronic kidney disease (HCC) 08/22/2020   Hypoalbuminemia 05/25/2020   GERD (gastroesophageal reflux disease) 05/25/2020   Pressure injury of skin 05/17/2020   Acute on chronic combined systolic and diastolic congestive heart failure (HCC) 03/07/2020   Type 2 diabetes mellitus with diabetic polyneuropathy, with long-term current use of insulin (HCC) 03/07/2020   Obesity, Class III, BMI 40-49.9 (morbid obesity) (HCC) 03/07/2020   Common bile duct (CBD) obstruction 05/28/2019   Benign neoplasm of ascending colon    Benign neoplasm of transverse colon    Benign neoplasm of descending colon    Benign neoplasm of sigmoid colon    Gastric polyps    Hyperkalemia 03/11/2019   Prolonged QT interval 03/11/2019   Acute blood loss anemia 03/11/2019   Onychomycosis 06/21/2018   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)    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 (HCC)    PVD (peripheral vascular disease) (HCC) 10/26/2017   E-coli UTI 07/27/2017   Hypothyroidism 07/27/2017   AKI (acute kidney injury) (HCC)    PAH (pulmonary artery hypertension) (HCC)    Impaired ambulation 07/19/2017   Leg cramps 02/27/2017   Peripheral edema 01/12/2017   Diabetic neuropathy (HCC) 11/12/2016   Anemia of chronic disease 10/03/2015   Historical diagnosis of generalized anxiety disorder 10/03/2015   Secondary insomnia 10/03/2015   Acute bronchitis 09/05/2015   Hyperglycemia due to diabetes mellitus (HCC) 06/07/2015   Non compliance with medical  treatment 04/17/2014   Rotator cuff tear 03/14/2014   Obesity 09/23/2013   Chronic HFrEF (heart failure with reduced ejection fraction) (HCC) 06/03/2013   Hypotension 12/25/2012   Hypokalemia 12/25/2012   Hyponatremia 12/25/2012   Urinary incontinence    MDD (major depressive disorder), recurrent episode, moderate (HCC) 11/12/2010   RBBB (right bundle branch block)    Wide-complex tachycardia    Coronary artery disease    Hyperlipemia 01/22/2009   Chronic hypotension 01/22/2009    ONSET DATE: 03/09/2023 MD referral to PT  REFERRING DIAG: S88.111D (ICD-10-CM) - Below-knee amputation of right lower extremity, subsequent encounter  THERAPY DIAG:  Unsteadiness on feet  Impaired functional mobility, balance, gait, and endurance  Muscle weakness (generalized)  Other abnormalities of gait and mobility  Rationale for Evaluation and Treatment: Rehabilitation  SUBJECTIVE:  SUBJECTIVE STATEMENT: She has history of right dominant UE rotator cuff tear and it has been acting up.  She may have slept wrong on it.    PERTINENT HISTORY: Right TTA 05/02/22, Lymphedema, idiopathic chronic venous HTN with ulcer, depression, colitis, diverticulitis, ESRD, gout, NSTEMI, DM2, polyneuropathy, PVD, CAD, right bundle branch block, mini strokes  PAIN:  Are you having pain?  Back pain today 5-6/10 and in last week 5/10 (medication & getting in bed laying flat)  and highest 10/10 (sitting up all day)  Right shoulder today 9/10 and in last week 6/10 (medication Oxy & muscle relaxor) and 10/10 (move wrong or gets bumped)  PRECAUTIONS: Fall and Other: No BP LUE  WEIGHT BEARING RESTRICTIONS: No  FALLS: Has patient fallen in last 6 months? No  LIVING ENVIRONMENT: Lives with: lives with their spouse and 2 small dogs Lives in: mobile home Home Access: Ramped entrance Home layout: One level Stairs: Yes: External: 6-7 steps; can reach both Has following equipment at home: Single point cane, Environmental consultant -  2 wheeled, Environmental consultant - 4 wheeled, Wheelchair (manual), [employer], Grab bars, and Ramped entry  OCCUPATION:  retired  PLOF: prior to amputation for ~year used RW in home & community  PATIENT GOALS:  to use prosthesis to walk, get out w/c in & out of car, get to bathroom.   OBJECTIVE:  COGNITION: Overall cognitive status:  Eval on 03/23/2023:   Within functional limits for tasks assessed  POSTURE:  Eval on 03/23/2023:  rounded shoulders, forward head, increased thoracic kyphosis, flexed trunk , and weight shift left  LOWER EXTREMITY ROM: ROM Right eval Left eval  Hip flexion    Hip extension    Hip abduction    Hip adduction    Hip internal rotation    Hip external rotation    Knee flexion    Knee extension    Ankle dorsiflexion    Ankle plantarflexion    Ankle inversion    Ankle eversion     (Blank rows = not tested)  LOWER EXTREMITY MMT:  MMT Right eval Left eval  Hip flexion 3-/5 3-/5  Hip extension 2/5 2/5  Hip abduction 2+/5 2+/5  Hip adduction    Hip internal rotation    Hip external rotation    Knee flexion 3-/5 3-/5  Knee extension 3-/5 3-/5  Ankle dorsiflexion    Ankle plantarflexion    Ankle inversion    Ankle eversion    At Evaluation all strength testing is grossly seated and functionally standing / gait. (Blank rows = not tested)  TRANSFERS: 09/07/2023: Scooting transfers with minA and sit to/from stand transfers with modA.   07/27/2023: -Sit>stand from wc with RW: MinA with heavy vc for sequencing and offweighting LE -Stand>sit wc with RW: CGA with patient performing safe controlled descent  06/15/2023: -Sit to stand transfers with RW wheelchair<>mat table at varied height: 19in with maxA to rise to stand; 22in ModA rise to stand, 24in min-modA rise to stand. -stand pivot transfer with RW w/c to mat table with modA once standing.  -Lateral scooting wc<>table at level height SBA; vc for LE positioning for optimal use of LE during  transfer -lateral scoot R: modA due to fatigue and uneven surface transfer from low surface to higher surface   05/04/2023:  Squat pivot transfers with modA.  04/21/2023: Pt sit to stand w/c to //bar with one hand pushing on w/c and other hand on //bar with modA first time & MinA 2nd/ 3rd time.  Best is RUE on //bar & LUE on w/c.  Stand to sit reaching LUE to w/c with minA to control descent.   PT demo & verbal cues on sit to/from stand w/c to RW.  1st time modA 2 person. 2nd time maxA one person.  Stand to sit with minA and constant cueing. Scooting transfers with minA with w/c & mat direct contact & same ht.   Eval on 03/23/2023:  Sit to stand: Max A (two-person assist) from 22" w/c to rolling walker Stand to sit: Max A (two-person assist) rolling walker to wheelchair  FUNCTIONAL TESTs:   07/27/2023: Patient stood with RW SBA for 54sec before c/o lightheadedness and sitting down to toilet. Resisted nudges applied posteriorly to simulate hygienic cleaning. VC for weight shifts when LE become tired.  BP: after standing 79min54sec 161/82 HR 92  06/15/2023: -Static stance for standing with RW; requiring Mod-MaxA for rise to stand d/t patient unsteady on feet posterior lean without proper righting strategy, SBA for stand   04/21/2023: Pt able to stand 60 sec with BUE on //bars with supervision.  Pt able to stand for 1 min with RW support with minA.  Eval on 03/23/2023: Patient maintained upright holding rolling walker with mod assist 2 people for safety for 30 seconds.  GAIT: 07/27/2023: Patient ambulated 20ft with narrow bathroom entry and 90deg turn to sit on commode CGA with minA and heavy vc for progression and sequencing of RW. While turning to sit on commode patient lost balance laterally requiring mod-maxA from therapist and patient use of grab bar to regain balance.   06/17/2023: Patient ambulated 15ft (maximal tolerated distance) with RW ModA (2 person for safety).  Required VC for LE sequencing and minA to steer walker and prevent tip over.   04/21/2023: Pt amb 5' in //bars with modA (2 person for safety) with PT verbal & tactile cues on sequence and weight shift.  Pt amb 10' with RW with maxA (2 person for safety) with PT verbal & tactile cues on sequence and weight shift.  Eval on 03/23/2023:  Patient took 2 steps (one per LE) with +2 max assist with rolling walker and TTA prosthesis.  Patient adducting prosthesis stepping on left foot with no awareness.  CURRENT PROSTHETIC WEAR ASSESSMENT: 06/17/2023:  Independence with prosthetic care:  Patient and husband are independent with: skin check, prosthetic cleaning, ply sock cleaning, proper wear schedule/adjustment Patient is dependent with: residual limb care, correct ply sock adjustment, and proper weight-bearing schedule/adjustment Donning prosthesis: husband is independent with proper donning. Patient requires MaxA. Patient can verbally direct someone how to properly assist. Doffing prosthesis: Husband is independent with proper donning. Patient requires modA. Patient can verbally direct someone how to properly assist. Prosthetic wear tolerance: majority of awake hours including dialysis hours, 1x/day, 3-4 days/week Prosthetic weight bearing tolerance: able to tolerate of standing without complains of prosthetic limb pain. Time not limited by limb discomfort but limited by muscle fatigue.   Eval on 03/23/2023:   Patient is dependent with: skin check, residual limb care, prosthetic cleaning, ply sock cleaning, correct ply sock adjustment, proper wear schedule/adjustment, and proper weight-bearing schedule/adjustment Donning prosthesis: Max A Doffing prosthesis: Min A Prosthetic wear tolerance: 2-6 hours, 1x/day, 3-4 days/week Prosthetic weight bearing tolerance: 3 minutes with  limb pain Edema: pitting edema Residual limb condition: No open areas, dry skin, normal color and temperature,  cylindrical shape Prosthetic description: Silicone liner with pin lock suspension, total contact socket with flexible inner socket, sach foot    TODAY'S TREATMENT:                                                                                                             DATE: 09/07/2023 Therapeutic Activities: -PT recommended making sure right shoulder is directly over right hand when pressing for transfers.  Scooting transfers with minA and sit to/from stand transfers with modA. Pt did report less spike in pain with this positioning. -Pt able to stand with RW support static for 2 min with supervision.  PT reviewed positioning of RW to limit balance losses posteriorly.   -PT reviewed recommendations for toileting with BSC including transfer, positioning and standing with RW to enable her husband to clean her more thoroughly. Trying to use BSC at consistent time like after breakfast or dinner may improve her control of bowels.  Pt & husband verbalized understanding.     TREATMENT:                                                                                                             DATE: 08/19/2023 Prosthetic training: -Patient sit>stand x3 maxA requiring multiple attempts and heavy vc for sequencing of LE walk back and UE to RW. Patient unable to walk LLE under herself though she had shifted to the rt appropriately -Continued education on preventing pulling on RW to rise to stand as well as increasing forward lean while standing to reduce instances of backwards LOB. -patient was able to ambulate with minA with RW 3ft before needing to sit in wheelchair. She had sudden onset of need to sit requiring w/c to immediately be brought under her.   - patient doffed prosthetic leg independently without cuing but noted cutoff sock incorrectly as outside layer; adjustments were made to layer of cut off sock with patient and husband verbalizing understanding, patient required minA to don prosthetic leg  and vc   Therapeutic Exercise: Blue tband seated rotations x10ea; vc for proper form Seated marching x10ea without back support but BUE support on w/c armrests Seated knee extension and flexion; x10ea without back support but BUE support on w/c armrests PT added above 2 exercises to HEP with HO, verbal & demo cues.  Pt verbalized understanding.    TREATMENT:  DATE: 08/17/2023 Prosthetic training: -Patient performed sit>stand from [facility] with RW ModA requiring vc for sequencing of LE to rise to stand, stand>sit MinA with vc for sequencing -Stood CGA with intermittent close SBA for performing side to side shifting with RW and vc -Stand pivot Lt with RW ModA for sit to stand, ModA for transfer, CGA for stand>sit -sit to Stand, ambulate with RW 35ft MinA with heavy vc 90deg turn Rt to sit in WC. VC for proper sequencing of rw and LE to prevent falls  -patients prosthesis became rotated on her limb, patient was able to instruct therapist on proper sequencing to take prosthesis on and off without cuing with accuracy. Educated on proper fit of prosthesis with sock wear including need to adjust during day as fluid is frequently pumped out of limb with activity especially weight bearing and visualization of knee cap. PT recommended seeing prosthetist for more pads in socket and new shrinkers for night time to maintain limb volume better. Patient and husband verbalized understanding however will require reeducation.     TREATMENT:                                                                                                                            DATE: 08/05/2023 Upon arrival patient and husband stated that she was not feeling well today with patient describing back pain, abdominal pain, pain/burning with urinating, and is lethargic at times only aroused when tactile stimulation is applied.   Patient wanted to participate with physical therapy, once gait belt was applied therapist felt the patient was sweating though she had been sitting in the [facility] for almost an hr before starting therapy session. Pt temperature was taken reading 96.4 however patient had red cheeks, sweating, and increased lethargy. She reported taking tylenol prior to coming to PT.  Patient initially did not tell us that she was advised yesterday at dialysis that she should go to the ED due to symptoms. Patient instructed on urgency of receiving medical treatment to prevent further progression of possible infection. Patient and husband agreeable to going directly to ED at termination of session.  And end of session patient was not oriented to day, requiring 3 cues to become oriented.    PATIENT EDUCATION: PATIENT EDUCATED ON FOLLOWING PROSTHETIC CARE: Education details: Use of stockinette under proximal liner to decrease itching sensation, no wounds presents to PT recommended not using Vive wear under liner Skin check, Prosthetic cleaning, Propper donning, and Proper wear schedule/adjustment Prosthetic wear tolerance: 3 hours 2x/day, 4 days/week Person educated: Patient and Spouse Education method: Explanation, Tactile cues, and Verbal cues Education comprehension: verbalized understanding, verbal cues required, tactile cues required, and needs further education  HOME EXERCISE PROGRAM: Access Code: [email or web address] URL: [email or web address] Date: 08/19/2023 Prepared by: Vladimir Faster   Exercises - Supine Bridge  - 1 x daily - 4 x weekly - 2 sets - 5 reps - 2 seconds hold - Supine Lower Trunk Rotation  -  1 x daily - 4 x weekly - 2 sets - 5 reps - 5 seconds hold - Supine Heel Slide with Strap  - 1 x daily - 4 x weekly - 2 sets - 5 reps - 2-3 seconds hold - Supine Hip Abduction  - 1 x daily - 4 x weekly - 2 sets - 5 reps - 2-3 seconds hold - Seated Hip Flexion Toward Target  - 1 x daily - 4 x weekly - 2 sets -  5 reps - 5 seconds hold - Seated Eccentric Abdominal Lean Back  - 1 x daily - 4 x weekly - 2 sets - 5 reps - 5 seconds hold - Seated Sidebending  - 1 x daily - 4 x weekly - 2 sets - 5 reps - 5 seconds hold - Seated Trunk Rotation with Crossed Arms  - 1 x daily - 4 x weekly - 2 sets - 5 reps - 5 seconds hold - Seated March  - 1 x daily - 4 x weekly - 2 sets - 10 reps - 5 seconds hold - Seated Knee Flexion Extension AROM   - 1 x daily - 4 x weekly - 2 sets - 10 reps - 5 seconds hold    ASSESSMENT:  CLINICAL IMPRESSION:  Patient has missed 2 weeks of PT due to scheduling issues.  She does not appear to have declined in her strength.   Patient will benefit from continued skilled physical therapy to address deficits and improve overall function.     OBJECTIVE IMPAIRMENTS: Abnormal gait, decreased activity tolerance, decreased balance, decreased endurance, decreased knowledge of condition, decreased knowledge of use of DME, decreased mobility, difficulty walking, decreased ROM, decreased strength, decreased safety awareness, postural dysfunction, prosthetic dependency , and pain.   ACTIVITY LIMITATIONS: standing, transfers, and locomotion level  PARTICIPATION LIMITATIONS: community activity, household mobility and dependency / burden of care on family  PERSONAL FACTORS: Age, Fitness, Past/current experiences, Time since onset of injury/illness/exacerbation, and 3+ comorbidities: see PMH  are also affecting patient's functional outcome.   REHAB POTENTIAL: Good  CLINICAL DECISION MAKING: Evolving/moderate complexity  EVALUATION COMPLEXITY: Moderate   GOALS: Goals reviewed with patient? Yes  SHORT TERM GOALS: Target date: 07/15/2023:  Patient & husband verbalize proper residual limb care and understanding for adjusting ply socks.  Baseline: SEE OBJECTIVE DATA Goal status:   MET 07/27/2023 2.  Patient able to sit to stand w/c to rolling walker with 1 person modA and maintain upright with RW  for 4 min with SBA to assist with toileting needs/hygienic cleaning.  Baseline: SEE OBJECTIVE DATA Goal status: partially met 07/08/2023  3. Patient ambulates 16ft including turning 90* to position to sit with RW & prosthesis with modA to navigate reaching bathroom in home.  Baseline: SEE OBJECTIVE DATA Goal status: partially met 07/08/2023   LONG TERM GOALS: Target date: 09/10/2023  Patient & husband demonstrates & verbalizes understanding of prosthetic care to enable safe utilization of prosthesis. Baseline: SEE OBJECTIVE DATA Goal status: MET 09/07/2023  Patient tolerates prosthesis wear >80% of awake hours on non-dialysis days and >50% on dialysis days without skin issues and limb pain </= 4/10. Baseline: SEE OBJECTIVE DATA Goal status: MET 06/15/2023  Scooting transfers with minA and sit to/from stand transfers with modA.  Baseline: SEE OBJECTIVE DATA Goal status: MET 09/07/2023  Patient ambulates >51ft including turning 90* to position to sit with prosthesis & RW with modA for household navigation.  Baseline: SEE OBJECTIVE DATA Goal status: ongoing 09/07/2023  Patient able to maintain static stance with RW support with SBA for 5 min to assist with hygienic cleaning.  Baseline: SEE OBJECTIVE DATA Goal status: ongoing 09/07/2023   PLAN:  PT FREQUENCY: 2x/week  PT DURATION: 12 weeks  PLANNED INTERVENTIONS: 97164- PT Re-evaluation, 97110-Therapeutic exercises, 97530- Therapeutic activity, 97112- Neuromuscular re-education, [phone]- Self Care, 13086- Gait training, [phone]- Prosthetic training, Patient/Family education, Balance training, DME instructions, Therapeutic exercises, Therapeutic activity, Neuromuscular re-education, Gait training, and Self Care  PLAN FOR NEXT SESSION: check remaining LTGs and do re-certification.  continue standing tolerance and ambulation with turn 90deg to sit  Lorie Rook, PT, DPT 09/07/2023, 4:25 PM

## 2023-09-08 DIAGNOSIS — N186 End stage renal disease: Secondary | ICD-10-CM | POA: Diagnosis not present

## 2023-09-08 DIAGNOSIS — D689 Coagulation defect, unspecified: Secondary | ICD-10-CM | POA: Diagnosis not present

## 2023-09-08 DIAGNOSIS — D509 Iron deficiency anemia, unspecified: Secondary | ICD-10-CM | POA: Diagnosis not present

## 2023-09-08 DIAGNOSIS — N2581 Secondary hyperparathyroidism of renal origin: Secondary | ICD-10-CM | POA: Diagnosis not present

## 2023-09-08 DIAGNOSIS — E1122 Type 2 diabetes mellitus with diabetic chronic kidney disease: Secondary | ICD-10-CM | POA: Diagnosis not present

## 2023-09-08 DIAGNOSIS — Z992 Dependence on renal dialysis: Secondary | ICD-10-CM | POA: Diagnosis not present

## 2023-09-08 DIAGNOSIS — D631 Anemia in chronic kidney disease: Secondary | ICD-10-CM | POA: Diagnosis not present

## 2023-09-09 ENCOUNTER — Ambulatory Visit: Admitting: Physical Therapy

## 2023-09-09 ENCOUNTER — Encounter: Payer: Self-pay | Admitting: Physical Therapy

## 2023-09-09 DIAGNOSIS — M6281 Muscle weakness (generalized): Secondary | ICD-10-CM | POA: Diagnosis not present

## 2023-09-09 DIAGNOSIS — G546 Phantom limb syndrome with pain: Secondary | ICD-10-CM

## 2023-09-09 DIAGNOSIS — R2681 Unsteadiness on feet: Secondary | ICD-10-CM | POA: Diagnosis not present

## 2023-09-09 DIAGNOSIS — Z7409 Other reduced mobility: Secondary | ICD-10-CM

## 2023-09-09 DIAGNOSIS — R2689 Other abnormalities of gait and mobility: Secondary | ICD-10-CM

## 2023-09-09 NOTE — Therapy (Signed)
 OUTPATIENT PHYSICAL THERAPY PROSTHETIC TREATMENT & RECERTIFICATION / PROGRESS NOTE   Patient Name: Susan [name] MRN: [id number] DOB:05-19-1951, 73 y.o., female Today's Date: 09/09/2023  PCP: Donita Brooks, MD  REFERRING PROVIDER:  Aldean Baker, MD  Progress Note Reporting Period 07/29/2023 to 09/09/2023  See note below for Objective Data and Assessment of Progress/Goals.   END OF SESSION:  PT End of Session - 09/09/23 1435     Visit Number 36    Number of Visits 60    Date for PT Re-Evaluation 12/03/23    Authorization Type [employer]    Progress Note Due on Visit 46    PT Start Time 1430    PT Stop Time 1512    PT Time Calculation (min) 42 min    Equipment Utilized During Treatment Gait belt    Activity Tolerance Patient tolerated treatment well;Patient limited by fatigue    Behavior During Therapy WFL for tasks assessed/performed                  Past Medical History:  Diagnosis Date   Anemia    hx   Anxiety    Arthritis    "generalized" (03/15/2014)   CAD (coronary artery disease)    MI in 2000 - MI  2007 - treated bare metal stent (no nuclear since then as 9/11)   Carotid artery disease (HCC)    Chronic diastolic heart failure (HCC)    a) ECHO (08/2013) EF 55-60% and RV function nl b) RHC (08/2013) RA 4, RV 30/5/7, PA 25/10 (16), PCWP 7, Fick CO/CI 6.3/2.7, PVR 1.5 WU, PA 61 and 66%   Daily headache    "~ every other day; since I fell in June" (03/15/2014)   Depression    Diabetic retinopathy (HCC)    Dyslipidemia    ESRD (end stage renal disease) (HCC)    Dialysis on Tues Thurs Sat   Exertional shortness of breath    GERD (gastroesophageal reflux disease)    History of blood transfusion    History of kidney stones    HTN (hypertension)    Hypothyroidism    Myocardial infarction (HCC)    Obesity    Osteoarthritis    PAF (paroxysmal atrial fibrillation) (HCC)    Peripheral neuropathy    bilateral feet/hands   PONV  (postoperative nausea and vomiting)    RBBB (right bundle branch block)    Old   Stroke (HCC)    mini strokes   Type II diabetes mellitus (HCC)    Type II, [name]. patient has omnipod insulin pump with Novolin R Insulin   Past Surgical History:  Procedure Laterality Date   A/V FISTULAGRAM Left 11/07/2022   Procedure: A/V Fistulagram;  Surgeon: Leonie Douglas, MD;  [facility]: [facility];  Service: Cardiovascular;  Laterality: Left;   ABDOMINAL HYSTERECTOMY  1980's   AMPUTATION Right 02/24/2018   Procedure: RIGHT FOOT GREAT TOE AND 2ND TOE AMPUTATION;  Surgeon: Nadara Mustard, MD;  Location: [facility] OR;  Service: Orthopedics;  Laterality: Right;   AMPUTATION Right 04/30/2018   Procedure: RIGHT TRANSMETATARSAL AMPUTATION;  Surgeon: Nadara Mustard, MD;  Location: [facility] OR;  Service: Orthopedics;  Laterality: Right;   AMPUTATION Right 05/02/2022   Procedure: RIGHT BELOW KNEE AMPUTATION;  Surgeon: Nadara Mustard, MD;  Location: [facility] OR;  Service: Orthopedics;  Laterality: Right;   APPLICATION OF WOUND VAC Right 06/13/2022   Procedure: APPLICATION OF WOUND VAC;  Surgeon:  Timothy Ford, MD;  Location: [facility] OR;  Service: Orthopedics;  Laterality: Right;   APPLICATION OF WOUND VAC Left 11/14/2022   Procedure: APPLICATION OF WOUND VAC;  Surgeon: Timothy Ford, MD;  [facility]: [facility] OR;  Service: Orthopedics;  Laterality: Left;   AV FISTULA PLACEMENT Left 04/02/2022   Procedure: LEFT ARM ARTERIOVENOUS (AV) FISTULA CREATION;  Surgeon: Margherita Shell, MD;  Location: [facility] OR;  Service: Vascular;  Laterality: Left;  PERIPHERAL NERVE BLOCK   AV FISTULA PLACEMENT Right 04/27/2023   Procedure: RIGHT ARM BRACHIOBASILIC ARTERIOVENOUS (AV) FISTULA CREATION;  Surgeon: Carlene Che, MD;  Location: [facility] OR;  Service: Vascular;  Laterality: Right;   BASCILIC VEIN TRANSPOSITION Left 07/31/2022   Procedure: LEFT ARM SECOND STAGE BASILIC VEIN TRANSPOSITION;  Surgeon: Margherita Shell, MD;  Location: [facility] OR;   Service: Vascular;  Laterality: Left;   BIOPSY  05/27/2020   Procedure: BIOPSY;  Surgeon: Vinetta Greening, DO;  [facility]: [facility];  Service: Endoscopy;;   CATARACT EXTRACTION, BILATERAL Bilateral ?2013   COLONOSCOPY W/ POLYPECTOMY     COLONOSCOPY WITH PROPOFOL N/A 03/13/2019   Procedure: COLONOSCOPY WITH PROPOFOL;  Surgeon: Nannette Babe, MD;  Location: [facility] ENDOSCOPY;  Service: Gastroenterology;  Laterality: N/A;   CORONARY ANGIOPLASTY WITH STENT PLACEMENT  1999; 2007   "1 + 1"   ERCP N/A 02/03/2022   Procedure: ENDOSCOPIC RETROGRADE CHOLANGIOPANCREATOGRAPHY (ERCP);  Surgeon: Alvis Jourdain, MD;  Location: [facility] ENDOSCOPY;  Service: Gastroenterology;  Laterality: N/A;   ESOPHAGOGASTRODUODENOSCOPY N/A 02/12/2023   Procedure: ESOPHAGOGASTRODUODENOSCOPY (EGD);  Surgeon: Alvis Jourdain, MD;  Location: [facility] ENDOSCOPY;  Service: Gastroenterology;  Laterality: N/A;   ESOPHAGOGASTRODUODENOSCOPY (EGD) WITH PROPOFOL N/A 03/13/2019   Procedure: ESOPHAGOGASTRODUODENOSCOPY (EGD) WITH PROPOFOL;  Surgeon: Nannette Babe, MD;  Location: [facility] ENDOSCOPY;  Service: Gastroenterology;  Laterality: N/A;   ESOPHAGOGASTRODUODENOSCOPY (EGD) WITH PROPOFOL N/A 05/27/2020   Procedure: ESOPHAGOGASTRODUODENOSCOPY (EGD) WITH PROPOFOL;  Surgeon: Vinetta Greening, DO;  [facility];  Service: Endoscopy;  Laterality: N/A;   ESOPHAGOGASTRODUODENOSCOPY (EGD) WITH PROPOFOL N/A 09/03/2022   Procedure: ESOPHAGOGASTRODUODENOSCOPY (EGD) WITH PROPOFOL;  Surgeon: Alvis Jourdain, MD;  Location: [facility] ENDOSCOPY;  Service: Gastroenterology;  Laterality: N/A;   EYE SURGERY Bilateral    lazer   FLEXIBLE SIGMOIDOSCOPY N/A 05/23/2022   Procedure: FLEXIBLE SIGMOIDOSCOPY;  Surgeon: Alvis Jourdain, MD;  Location: [facility] ENDOSCOPY;  Service: Gastroenterology;  Laterality: N/A;   FLEXIBLE SIGMOIDOSCOPY N/A 05/24/2022   Procedure: FLEXIBLE SIGMOIDOSCOPY;  Surgeon: Daina Drum, MD;  Location: [facility] ENDOSCOPY;  Service: Gastroenterology;  Laterality: N/A;    FLEXIBLE SIGMOIDOSCOPY N/A 09/03/2022   Procedure: FLEXIBLE SIGMOIDOSCOPY;  Surgeon: Alvis Jourdain, MD;  Location: [facility] ENDOSCOPY;  Service: Gastroenterology;  Laterality: N/A;   HEMOSTASIS CLIP PLACEMENT  03/13/2019   Procedure: HEMOSTASIS CLIP PLACEMENT;  Surgeon: Nannette Babe, MD;  Location: [facility] ENDOSCOPY;  Service: Gastroenterology;;   HEMOSTASIS CLIP PLACEMENT  05/23/2022   Procedure: HEMOSTASIS CLIP PLACEMENT;  Surgeon: Alvis Jourdain, MD;  Location: [facility] ENDOSCOPY;  Service: Gastroenterology;;   HEMOSTASIS CONTROL  05/24/2022   Procedure: HEMOSTASIS CONTROL;  Surgeon: Daina Drum, MD;  Location: [facility] ENDOSCOPY;  Service: Gastroenterology;;   HOT HEMOSTASIS N/A 05/23/2022   Procedure: HOT HEMOSTASIS (ARGON PLASMA COAGULATION/BICAP);  Surgeon: Alvis Jourdain, MD;  Location: [facility] ENDOSCOPY;  Service: Gastroenterology;  Laterality: N/A;   I & D EXTREMITY Left 05/05/2022   Procedure: IRRIGATION AND DEBRIDEMENT LEFT ARM AV FISTULA;  Surgeon: Young Hensen, MD;  Location: [facility] OR;  Service: Vascular;  Laterality:  Left;   I & D EXTREMITY N/A 11/14/2022   Procedure: IRRIGATION AND DEBRIDEMENT OF LOWER EXTREMITY WOUND;  Surgeon: Timothy Ford, MD;  Location: [facility] OR;  Service: Orthopedics;  Laterality: N/A;   INSERTION OF DIALYSIS CATHETER Right 04/02/2022   Procedure: INSERTION OF TUNNELED DIALYSIS CATHETER;  Surgeon: Margherita Shell, MD;  Location: [facility] OR;  Service: Vascular;  Laterality: Right;   IR FLUORO GUIDE CV LINE RIGHT  08/10/2023   IR VENOCAVAGRAM IVC  08/10/2023   KNEE ARTHROSCOPY Left 10/25/2006   POLYPECTOMY  03/13/2019   Procedure: POLYPECTOMY;  Surgeon: Nannette Babe, MD;  Location: [facility] ENDOSCOPY;  Service: Gastroenterology;;   REMOVAL OF STONES  02/03/2022   Procedure: REMOVAL OF STONES;  Surgeon: Alvis Jourdain, MD;  Location: [facility] ENDOSCOPY;  Service: Gastroenterology;;   REVISON OF ARTERIOVENOUS FISTULA Left 08/20/2022   Procedure: REVISON OF LEFT ARM ARTERIOVENOUS FISTULA;  Surgeon:  Margherita Shell, MD;  Location: [facility] OR;  Service: Vascular;  Laterality: Left;   RIGHT HEART CATH N/A 07/24/2017   Procedure: RIGHT HEART CATH;  Surgeon: Mardell Shade, MD;  [facility];  Service: Cardiovascular;  Laterality: N/A;   RIGHT HEART CATHETERIZATION N/A 09/22/2013   Procedure: RIGHT HEART CATH;  Surgeon: Mardell Shade, MD;  Location: [facility] CATH LAB;  Service: Cardiovascular;  Laterality: N/A;   SHOULDER ARTHROSCOPY WITH OPEN ROTATOR CUFF REPAIR Right 03/14/2014   Procedure: RIGHT SHOULDER ARTHROSCOPY WITH BICEPS RELEASE, OPEN SUBSCAPULA REPAIR, OPEN SUPRASPINATUS REPAIR.;  Surgeon: Jasmine Mesi, MD;  Location: [facility] OR;  Service: Orthopedics;  Laterality: Right;   SPHINCTEROTOMY  02/03/2022   Procedure: SPHINCTEROTOMY;  Surgeon: Alvis Jourdain, MD;  Location: [facility] ENDOSCOPY;  Service: Gastroenterology;;   STUMP REVISION Right 06/13/2022   Procedure: REVISION RIGHT BELOW KNEE AMPUTATION;  Surgeon: Timothy Ford, MD;  Location: [facility] OR;  Service: Orthopedics;  Laterality: Right;   TEE WITHOUT CARDIOVERSION N/A 02/04/2022   Procedure: TRANSESOPHAGEAL ECHOCARDIOGRAM (TEE);  Surgeon: Mardell Shade, MD;  Location: [facility] ENDOSCOPY;  Service: Cardiovascular;  Laterality: N/A;   THROMBECTOMY W/ EMBOLECTOMY Left 08/20/2022   Procedure: THROMBECTOMY OF LEFT ARM ARTERIOVENOUS FISTULA;  Surgeon: Margherita Shell, MD;  Location: [facility] OR;  Service: Vascular;  Laterality: Left;   TOE AMPUTATION Right 02/24/2018   GREAT TOE AND 2ND TOE AMPUTATION   TUBAL LIGATION  1970's   UPPER EXTREMITY VENOGRAPHY N/A 07/31/2023   Procedure: UPPER EXTREMITY VENOGRAPHY;  Surgeon: Carlene Che, MD;  [facility]: [facility];  Service: Cardiovascular;  Laterality: N/A;   Patient Active Problem List   Diagnosis Date Noted   Pyelonephritis 08/06/2023   Complicated UTI (urinary tract infection) 08/05/2023   Atrial fibrillation (HCC) 06/17/2023   Atopic dermatitis 06/17/2023   Hyperosmolar  hyperglycemic state (HHS) (HCC) 02/05/2023   Gastroparesis 02/05/2023   Depression 01/29/2023   Intractable nausea and vomiting 01/20/2023   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 difficile colitis 06/05/2022   Spleen hematoma 06/05/2022   Dehiscence  of amputation stump of right lower extremity (HCC) 06/05/2022   Rectal ulcer 05/27/2022   ESRD (end stage renal disease) (HCC) 05/27/2022   GI bleed 05/23/2022   Difficult intravenous access 05/23/2022   Gangrene of right foot (HCC) 05/02/2022   S/P BKA (below knee amputation) unilateral, right (HCC) 05/02/2022   Unspecified protein-calorie malnutrition (HCC) 04/15/2022   Secondary hyperparathyroidism of renal origin (HCC) 04/14/2022   Coagulation defect, unspecified (HCC) 04/09/2022   Acquired absence of other left toe(s) (HCC) 04/07/2022   Allergy, unspecified, initial encounter 04/07/2022   Dependence on renal dialysis (HCC) 04/07/2022   Gout due to renal impairment, unspecified site 04/07/2022   Hypertensive heart and chronic kidney disease with heart failure and with stage 5 chronic kidney disease, or end stage renal disease (HCC) 04/07/2022   Personal history of transient ischemic attack (TIA), and cerebral infarction without residual deficits 04/07/2022   Renal osteodystrophy 04/07/2022   Venous stasis ulcer of right calf (HCC) 03/31/2022   Fistula, colovaginal 03/26/2022    Diarrhea 03/26/2022   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   Sepsis without acute organ dysfunction (HCC)    Bacteremia    Acute pancreatitis 02/01/2022   Abdominal pain 02/01/2022   SIRS (systemic inflammatory response syndrome) (HCC) 02/01/2022   Transaminitis 02/01/2022   History of anemia due to chronic kidney disease 02/01/2022   Paroxysmal atrial fibrillation (HCC) 02/01/2022   Uncontrolled type 2 diabetes mellitus with hyperglycemia, with long-term current use of insulin (HCC) 01/14/2022   NSTEMI (non-ST elevated myocardial infarction) (HCC) 03/05/2021   Acute renal failure superimposed on stage 4 chronic kidney disease (HCC) 08/22/2020   Hypoalbuminemia 05/25/2020   GERD (gastroesophageal reflux disease) 05/25/2020   Pressure injury of skin 05/17/2020   Acute on chronic combined systolic and diastolic congestive heart failure (HCC) 03/07/2020   Type 2 diabetes mellitus with diabetic polyneuropathy, with long-term current use of insulin (HCC) 03/07/2020   Obesity, Class III, BMI 40-49.9 (morbid obesity) (HCC) 03/07/2020   Common bile duct (CBD) obstruction 05/28/2019   Benign neoplasm of ascending colon    Benign neoplasm of transverse colon    Benign neoplasm of descending colon    Benign neoplasm of sigmoid colon    Gastric polyps    Hyperkalemia 03/11/2019   Prolonged QT interval 03/11/2019   Acute blood loss anemia 03/11/2019   Onychomycosis 06/21/2018   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)    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 (HCC)    PVD (peripheral vascular disease) (HCC) 10/26/2017   E-coli UTI 07/27/2017   Hypothyroidism 07/27/2017   AKI (acute kidney injury) (HCC)    PAH (pulmonary artery hypertension) (HCC)    Impaired ambulation 07/19/2017   Leg cramps 02/27/2017   Peripheral edema 01/12/2017   Diabetic neuropathy (HCC) 11/12/2016   Anemia of chronic disease 10/03/2015   Historical diagnosis of generalized anxiety disorder  10/03/2015   Secondary insomnia 10/03/2015   Acute bronchitis 09/05/2015   Hyperglycemia due to diabetes mellitus (HCC) 06/07/2015   Non compliance with medical treatment 04/17/2014   Rotator cuff tear 03/14/2014   Obesity 09/23/2013   Chronic HFrEF (heart failure with reduced ejection fraction) (HCC) 06/03/2013   Hypotension 12/25/2012   Hypokalemia 12/25/2012   Hyponatremia 12/25/2012   Urinary incontinence    MDD (major depressive disorder), recurrent episode, moderate (HCC) 11/12/2010   RBBB (right bundle branch block)    Wide-complex tachycardia    Coronary artery disease    Hyperlipemia 01/22/2009   Chronic  hypotension 01/22/2009    ONSET DATE: 03/09/2023 MD referral to PT  REFERRING DIAG: S88.111D (ICD-10-CM) - Below-knee amputation of right lower extremity, subsequent encounter   THERAPY DIAG:  Unsteadiness on feet  Muscle weakness (generalized)  Other abnormalities of gait and mobility  Impaired functional mobility, balance, gait, and endurance  Phantom limb syndrome with pain (HCC)  Rationale for Evaluation and Treatment: Rehabilitation  SUBJECTIVE:  SUBJECTIVE STATEMENT: She has made progress with her mobility with PT but needs more PT to make better recovery.  Patient questioning positioning and activities that she can do without increasing right shoulder pain.  PERTINENT HISTORY: Right TTA 05/02/22, Lymphedema, idiopathic chronic venous HTN with ulcer, depression, colitis, diverticulitis, ESRD, gout, NSTEMI, DM2, polyneuropathy, PVD, CAD, right bundle branch block, mini strokes  PAIN:  Are you having pain?  Back pain today at rest 4-5/10 & standing 5/10 and in last week 4/10 (medication & getting in bed laying flat)  and highest 10/10 (sitting up all day)  Right shoulder today 9/10 and in last week 6/10 (medication Oxy & muscle relaxor) and 10/10 (move wrong or gets bumped)  PRECAUTIONS: Fall and Other: No BP LUE  WEIGHT BEARING RESTRICTIONS:  No  FALLS: Has patient fallen in last 6 months? No  LIVING ENVIRONMENT: Lives with: lives with their spouse and 2 small dogs Lives in: mobile home Home Access: Ramped entrance Home layout: One level Stairs: Yes: External: 6-7 steps; can reach both Has following equipment at home: Single point cane, Environmental consultant - 2 wheeled, Environmental consultant - 4 wheeled, Wheelchair (manual), [employer], Grab bars, and Ramped entry  OCCUPATION:  retired  PLOF: prior to amputation for ~year used RW in home & community  PATIENT GOALS:  to use prosthesis to walk, get out w/c in & out of car, get to bathroom.   OBJECTIVE:  COGNITION: Overall cognitive status:  Eval on 03/23/2023:   Within functional limits for tasks assessed  POSTURE:  Eval on 03/23/2023:  rounded shoulders, forward head, increased thoracic kyphosis, flexed trunk , and weight shift left  LOWER EXTREMITY ROM: ROM Right eval Left eval  Hip flexion    Hip extension    Hip abduction    Hip adduction    Hip internal rotation    Hip external rotation    Knee flexion    Knee extension    Ankle dorsiflexion    Ankle plantarflexion    Ankle inversion    Ankle eversion     (Blank rows = not tested)  LOWER EXTREMITY MMT:  MMT Right eval Left eval Right 09/09/23 Left 09/09/23  Hip flexion 3-/5 3-/5 3/5 3/5  Hip extension 2/5 2/5 3-/5 3-/5  Hip abduction 2+/5 2+/5 3-/5 3-/5  Hip adduction      Hip internal rotation      Hip external rotation      Knee flexion 3-/5 3-/5 3/5 3/5  Knee extension 3-/5 3-/5 3/5 3/5  Ankle dorsiflexion      Ankle plantarflexion      Ankle inversion      Ankle eversion      At Evaluation all strength testing is grossly seated and functionally standing / gait. (Blank rows = not tested)  TRANSFERS: 09/07/2023: Scooting transfers with minA and sit to/from stand transfers with modA.   07/27/2023: -Sit>stand from wc with RW: MinA with heavy vc for sequencing and offweighting LE -Stand>sit wc with RW: CGA with  patient performing safe controlled descent  06/15/2023: -Sit to stand transfers with RW  wheelchair<>mat table at varied height: 19in with maxA to rise to stand; 22in ModA rise to stand, 24in min-modA rise to stand. -stand pivot transfer with RW w/c to mat table with modA once standing.  -Lateral scooting wc<>table at level height SBA; vc for LE positioning for optimal use of LE during transfer -lateral scoot R: modA due to fatigue and uneven surface transfer from low surface to higher surface   05/04/2023: Squat pivot transfers with modA.  04/21/2023: Pt sit to stand w/c to //bar with one hand pushing on w/c and other hand on //bar with modA first time & MinA 2nd/ 3rd time.  Best is RUE on //bar & LUE on w/c.  Stand to sit reaching LUE to w/c with minA to control descent.   PT demo & verbal cues on sit to/from stand w/c to RW.  1st time modA 2 person. 2nd time maxA one person.  Stand to sit with minA and constant cueing. Scooting transfers with minA with w/c & mat direct contact & same ht.   Eval on 03/23/2023:  Sit to stand: Max A (two-person assist) from 22" w/c to rolling walker Stand to sit: Max A (two-person assist) rolling walker to wheelchair  FUNCTIONAL TESTs:   09/09/2023: Patient able to tolerate static stance for 5 minutes with BUE support on RW with supervision.  This was current certification goal set so that patient can stand long enough for her husband to assist with cleaning her after toileting.  07/27/2023: Patient stood with RW SBA for 54sec before c/o lightheadedness and sitting down to toilet. Resisted nudges applied posteriorly to simulate hygienic cleaning. VC for weight shifts when LE become tired.  BP: after standing 16min54sec 161/82 HR 92  06/15/2023: -Static stance for standing with RW; requiring Mod-MaxA for rise to stand d/t patient unsteady on feet posterior lean without proper righting strategy, SBA for stand   04/21/2023: Pt able to stand 60  sec with BUE on //bars with supervision.  Pt able to stand for 1 min with RW support with minA.  Eval on 03/23/2023: Patient maintained upright holding rolling walker with mod assist 2 people for safety for 30 seconds.  GAIT: 09/09/2023: Patient ambulated 30' with RW including turning 90*to position to sit with mod assistance  07/27/2023: Patient ambulated 43ft with narrow bathroom entry and 90deg turn to sit on commode CGA with minA and heavy vc for progression and sequencing of RW. While turning to sit on commode patient lost balance laterally requiring mod-maxA from therapist and patient use of grab bar to regain balance.   06/17/2023: Patient ambulated 70ft (maximal tolerated distance) with RW ModA (2 person for safety). Required VC for LE sequencing and minA to steer walker and prevent tip over.   04/21/2023: Pt amb 5' in //bars with modA (2 person for safety) with PT verbal & tactile cues on sequence and weight shift.  Pt amb 10' with RW with maxA (2 person for safety) with PT verbal & tactile cues on sequence and weight shift.  Eval on 03/23/2023:  Patient took 2 steps (one per LE) with +2 max assist with rolling walker and TTA prosthesis.  Patient adducting prosthesis stepping on left foot with no awareness.  CURRENT PROSTHETIC WEAR ASSESSMENT: 09/07/2023: Patient and husband are independent with: skin check, prosthetic cleaning, ply sock cleaning, proper wear schedule/adjustment, residual limb care, correct ply sock adjustment, and proper weight-bearing schedule/adjustment Pt reports wear most of awake hours on dialysis and non-dialysis days.  She wears prosthesis to dialysis now to assist with transfers prn and is able to verbalize proper adjustment of prosthesis weight for weigh-in & weigh-out.   06/17/2023:  Independence with prosthetic care:  Patient and husband are independent with: skin check, prosthetic cleaning, ply sock cleaning, proper wear schedule/adjustment Patient is  dependent with: residual limb care, correct ply sock adjustment, and proper weight-bearing schedule/adjustment Donning prosthesis: husband is independent with proper donning. Patient requires MaxA. Patient can verbally direct someone how to properly assist. Doffing prosthesis: Husband is independent with proper donning. Patient requires modA. Patient can verbally direct someone how to properly assist. Prosthetic wear tolerance: majority of awake hours including dialysis hours, 1x/day, 3-4 days/week Prosthetic weight bearing tolerance: able to tolerate of standing without complains of prosthetic limb pain. Time not limited by limb discomfort but limited by muscle fatigue.   Eval on 03/23/2023:   Patient is dependent with: skin check, residual limb care, prosthetic cleaning, ply sock cleaning, correct ply sock adjustment, proper wear schedule/adjustment, and proper weight-bearing schedule/adjustment Donning prosthesis: Max A Doffing prosthesis: Min A Prosthetic wear tolerance: 2-6 hours, 1x/day, 3-4 days/week Prosthetic weight bearing tolerance: 3 minutes with limb pain Edema: pitting edema Residual limb condition: No open areas, dry skin, normal color and temperature, cylindrical shape Prosthetic description: Silicone liner with pin lock suspension, total contact socket with flexible inner socket, sach foot    TODAY'S TREATMENT:                                                                                                             DATE: 09/09/2023 Prosthetic Training with Transtibial Prosthesis: - See objective data for standing and gait.  Therapeutic Activities: -PT educated verbally and with demo on positioning right upper extremity in sitting and right side-lying.  Patient and husband verbalized understanding. - Patient performed trunk balance activities seated at edge of wheelchair without back support or arm support.  Back extensor strength leaning forward to upright, abdominal  strength leaning back against the back of the wheelchair then sitting up, trunk rotation with L UE cradling RUE at elbow and side bend.  PT explained that all motions were controlled that will assist with getting her right shoulder to move within acceptable ranges which can decrease the chances of it fully freezing.  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   TREATMENT:  DATE: 09/07/2023 Therapeutic Activities: -PT recommended making sure right shoulder is directly over right hand when pressing for transfers.  Scooting transfers with minA and sit to/from stand transfers with modA. Pt did report less spike in pain with this positioning. -Pt able to stand with RW support static for 2 min with supervision.  PT reviewed positioning of RW to limit balance losses posteriorly.   -PT reviewed recommendations for toileting with BSC including transfer, positioning and standing with RW to enable her husband to clean her more thoroughly. Trying to use BSC at consistent time like after breakfast or dinner may improve her control of bowels.  Pt & husband verbalized understanding.     TREATMENT:                                                                                                             DATE: 08/19/2023 Prosthetic training: -Patient sit>stand x3 maxA requiring multiple attempts and heavy vc for sequencing of LE walk back and UE to RW. Patient unable to walk LLE under herself though she had shifted to the rt appropriately -Continued education on preventing pulling on RW to rise to stand as well as increasing forward lean while standing to reduce instances of backwards LOB. -patient was able to ambulate with minA with RW 73ft before needing to sit in wheelchair. She had  sudden onset of need to sit requiring w/c to immediately be brought under her.   - patient doffed prosthetic leg independently without cuing but noted cutoff sock incorrectly as outside layer; adjustments were made to layer of cut off sock with patient and husband verbalizing understanding, patient required minA to don prosthetic leg and vc   Therapeutic Exercise: Blue tband seated rotations x10ea; vc for proper form Seated marching x10ea without back support but BUE support on w/c armrests Seated knee extension and flexion; x10ea without back support but BUE support on w/c armrests PT added above 2 exercises to HEP with HO, verbal & demo cues.  Pt verbalized understanding.    TREATMENT:                                                                                                             DATE: 08/17/2023 Prosthetic training: -Patient performed sit>stand from [facility] with RW ModA requiring vc for sequencing of LE to rise to stand, stand>sit MinA with vc for sequencing -Stood CGA with intermittent close SBA for performing side to side shifting with RW and vc -Stand pivot Lt with RW ModA for sit to stand, ModA for transfer, CGA for stand>sit -sit to Stand, ambulate  with RW 60ft MinA with heavy vc 90deg turn Rt to sit in WC. VC for proper sequencing of rw and LE to prevent falls  -patients prosthesis became rotated on her limb, patient was able to instruct therapist on proper sequencing to take prosthesis on and off without cuing with accuracy. Educated on proper fit of prosthesis with sock wear including need to adjust during day as fluid is frequently pumped out of limb with activity especially weight bearing and visualization of knee cap. PT recommended seeing prosthetist for more pads in socket and new shrinkers for night time to maintain limb volume better. Patient and husband verbalized understanding however will require reeducation.       PATIENT EDUCATION: PATIENT EDUCATED ON  FOLLOWING PROSTHETIC CARE: Education details: Use of stockinette under proximal liner to decrease itching sensation, no wounds presents to PT recommended not using Vive wear under liner Skin check, Prosthetic cleaning, Propper donning, and Proper wear schedule/adjustment Prosthetic wear tolerance: 3 hours 2x/day, 4 days/week Person educated: Patient and Spouse Education method: Explanation, Tactile cues, and Verbal cues Education comprehension: verbalized understanding, verbal cues required, tactile cues required, and needs further education  HOME EXERCISE PROGRAM: Access Code: [email or web address] URL: [email or web address] Date: 08/19/2023 Prepared by: Lorie Rook   Exercises - Supine Bridge  - 1 x daily - 4 x weekly - 2 sets - 5 reps - 2 seconds hold - Supine Lower Trunk Rotation  - 1 x daily - 4 x weekly - 2 sets - 5 reps - 5 seconds hold - Supine Heel Slide with Strap  - 1 x daily - 4 x weekly - 2 sets - 5 reps - 2-3 seconds hold - Supine Hip Abduction  - 1 x daily - 4 x weekly - 2 sets - 5 reps - 2-3 seconds hold - Seated Hip Flexion Toward Target  - 1 x daily - 4 x weekly - 2 sets - 5 reps - 5 seconds hold - Seated Eccentric Abdominal Lean Back  - 1 x daily - 4 x weekly - 2 sets - 5 reps - 5 seconds hold - Seated Sidebending  - 1 x daily - 4 x weekly - 2 sets - 5 reps - 5 seconds hold - Seated Trunk Rotation with Crossed Arms  - 1 x daily - 4 x weekly - 2 sets - 5 reps - 5 seconds hold - Seated March  - 1 x daily - 4 x weekly - 2 sets - 10 reps - 5 seconds hold - Seated Knee Flexion Extension AROM   - 1 x daily - 4 x weekly - 2 sets - 10 reps - 5 seconds hold    ASSESSMENT:  CLINICAL IMPRESSION:  Patient met all LTG's set for the certification period.  She is able to perform standing and gait activities with the skilled PT assistance.  She has a potential to achieve these tasks with her husband's assistance with further PT patient would benefit from ongoing PT to improve her  functional mobility further.  Patient's progress is slowed by significant medical deficits with recent hospitalizations and dialysis causing weakness and fatigue.  She is slowly improving but requires skilled PT to improve these to a safe functional level.  OBJECTIVE IMPAIRMENTS: Abnormal gait, decreased activity tolerance, decreased balance, decreased endurance, decreased knowledge of condition, decreased knowledge of use of DME, decreased mobility, difficulty walking, decreased ROM, decreased strength, decreased safety awareness, postural dysfunction, prosthetic dependency , and pain.   ACTIVITY LIMITATIONS: standing, transfers, and locomotion  level  PARTICIPATION LIMITATIONS: community activity, household mobility and dependency / burden of care on family  PERSONAL FACTORS: Age, Fitness, Past/current experiences, Time since onset of injury/illness/exacerbation, and 3+ comorbidities: see PMH  are also affecting patient's functional outcome.   REHAB POTENTIAL: Good  CLINICAL DECISION MAKING: Evolving/moderate complexity  EVALUATION COMPLEXITY: Moderate   GOALS: Goals reviewed with patient? Yes  SHORT TERM GOALS: Target date: 10/07/2023:  Patient & husband verbalized patient able to perform scooting transfer to / from [facility] and sit to/from stand [facility] to RW with husband's assistance safely. Baseline: SEE OBJECTIVE DATA Goal status:   Set 09/09/2023 2.  Patient able to sit to stand w/c to rolling walker with min assistance. Baseline: SEE OBJECTIVE DATA Goal status: Set 09/09/2023  3. Patient ambulates 11ft including turning 90* to position to sit with RW & prosthesis with modA.  Baseline: SEE OBJECTIVE DATA Goal status: Set 09/09/2023   LONG TERM GOALS: Target date: 09/10/2023  Patient & husband demonstrates & verbalizes understanding of prosthetic care to enable safe utilization of prosthesis. Baseline: SEE OBJECTIVE DATA Goal status: MET 09/07/2023  Patient tolerates prosthesis wear  >80% of awake hours on non-dialysis days and >50% on dialysis days without skin issues and limb pain </= 4/10. Baseline: SEE OBJECTIVE DATA Goal status: MET 06/15/2023  Scooting transfers with minA and sit to/from stand transfers with modA.  Baseline: SEE OBJECTIVE DATA Goal status: MET 09/07/2023  Patient ambulates >65ft including turning 90* to position to sit with prosthesis & RW with modA for household navigation.  Baseline: SEE OBJECTIVE DATA Goal status: MET 09/09/2023  Patient able to maintain static stance with RW support with SBA for 5 min to assist with hygienic cleaning.  Baseline: SEE OBJECTIVE DATA Goal status: MET  09/09/2023  UPDATED LONG TERM GOALS: Target date: 12/03/2023  Patient & husband continues to demonstrate & verbalize understanding of prosthetic care and wears prosthesis >80% of awake hours daily to enable safe utilization of prosthesis. Baseline: SEE OBJECTIVE DATA Goal status: Ongoing 09/07/2023  Patient and husband report patient is able to stand to enable cleaning after toileting safely with no issues Baseline: SEE OBJECTIVE DATA Goal status: SET 06/15/2023  Patient able to perform sit to/from stand transfers with minA.  Baseline: SEE OBJECTIVE DATA Goal status: Updated 09/07/2023  Patient ambulates >60ft including turning 90* to position to sit with prosthesis & RW with minA with husband's assistance safely. Baseline: SEE OBJECTIVE DATA Goal status: Updated 09/09/2023  Patient understanding of ongoing HEP. Baseline: SEE OBJECTIVE DATA Goal status: SET  09/09/2023  PLAN:  PT FREQUENCY: 2x/week  PT DURATION: 12 weeks  PLANNED INTERVENTIONS: 97164- PT Re-evaluation, 97110-Therapeutic exercises, 97530- Therapeutic activity, 97112- Neuromuscular re-education, [phone]- Self Care, 27062- Gait training, [phone]- Prosthetic training, Patient/Family education, Balance training, DME instructions, Therapeutic exercises, Therapeutic activity, Neuromuscular  re-education, Gait training, and Self Care  PLAN FOR NEXT SESSION: Work towards STG's, update HEP,  continue standing tolerance and ambulation with turn 90deg to sit, check if she tried scooting transfer to [facility] at home.  Lorie Rook, PT, DPT 09/09/2023, 4:47 PM

## 2023-09-14 ENCOUNTER — Encounter: Payer: Self-pay | Admitting: Physical Therapy

## 2023-09-14 ENCOUNTER — Ambulatory Visit: Admitting: Physical Therapy

## 2023-09-14 DIAGNOSIS — N2581 Secondary hyperparathyroidism of renal origin: Secondary | ICD-10-CM | POA: Diagnosis not present

## 2023-09-14 DIAGNOSIS — R2689 Other abnormalities of gait and mobility: Secondary | ICD-10-CM

## 2023-09-14 DIAGNOSIS — R2681 Unsteadiness on feet: Secondary | ICD-10-CM

## 2023-09-14 DIAGNOSIS — Z7409 Other reduced mobility: Secondary | ICD-10-CM

## 2023-09-14 DIAGNOSIS — Z992 Dependence on renal dialysis: Secondary | ICD-10-CM | POA: Diagnosis not present

## 2023-09-14 DIAGNOSIS — M6281 Muscle weakness (generalized): Secondary | ICD-10-CM | POA: Diagnosis not present

## 2023-09-14 DIAGNOSIS — G546 Phantom limb syndrome with pain: Secondary | ICD-10-CM | POA: Diagnosis not present

## 2023-09-14 DIAGNOSIS — D509 Iron deficiency anemia, unspecified: Secondary | ICD-10-CM | POA: Diagnosis not present

## 2023-09-14 DIAGNOSIS — D689 Coagulation defect, unspecified: Secondary | ICD-10-CM | POA: Diagnosis not present

## 2023-09-14 DIAGNOSIS — N186 End stage renal disease: Secondary | ICD-10-CM | POA: Diagnosis not present

## 2023-09-14 NOTE — Therapy (Signed)
 OUTPATIENT PHYSICAL THERAPY PROSTHETIC TREATMENT  Patient Name: Susan [name] MRN: [id number] DOB:Sep 29, 1950, 73 y.o., female Today's Date: 09/14/2023  PCP: Austine Lefort, MD  REFERRING PROVIDER:  Gearldean Keepers, MD  END OF SESSION:  PT End of Session - 09/14/23 1344     Visit Number 37    Number of Visits 60    Date for PT Re-Evaluation 12/03/23    Authorization Type [employer]    Progress Note Due on Visit 46    PT Start Time 1344    PT Stop Time 1429    PT Time Calculation (min) 45 min    Equipment Utilized During Treatment Gait belt    Activity Tolerance Patient tolerated treatment well;Patient limited by fatigue    Behavior During Therapy WFL for tasks assessed/performed                   Past Medical History:  Diagnosis Date   Anemia    hx   Anxiety    Arthritis    "generalized" (03/15/2014)   CAD (coronary artery disease)    MI in 2000 - MI  2007 - treated bare metal stent (no nuclear since then as 9/11)   Carotid artery disease (HCC)    Chronic diastolic heart failure (HCC)    a) ECHO (08/2013) EF 55-60% and RV function nl b) RHC (08/2013) RA 4, RV 30/5/7, PA 25/10 (16), PCWP 7, Fick CO/CI 6.3/2.7, PVR 1.5 WU, PA 61 and 66%   Daily headache    "~ every other day; since I fell in June" (03/15/2014)   Depression    Diabetic retinopathy (HCC)    Dyslipidemia    ESRD (end stage renal disease) (HCC)    Dialysis on Tues Thurs Sat   Exertional shortness of breath    GERD (gastroesophageal reflux disease)    History of blood transfusion    History of kidney stones    HTN (hypertension)    Hypothyroidism    Myocardial infarction (HCC)    Obesity    Osteoarthritis    PAF (paroxysmal atrial fibrillation) (HCC)    Peripheral neuropathy    bilateral feet/hands   PONV (postoperative nausea and vomiting)    RBBB (right bundle branch block)    Old   Stroke (HCC)    mini strokes   Type II diabetes mellitus (HCC)    Type II, [name] libre  left upper arm. patient has omnipod insulin  pump with Novolin R Insulin    Past Surgical History:  Procedure Laterality Date   A/V FISTULAGRAM Left 11/07/2022   Procedure: A/V Fistulagram;  Surgeon: Carlene Che, MD;  [facility]: [facility];  Service: Cardiovascular;  Laterality: Left;   ABDOMINAL HYSTERECTOMY  1980's   AMPUTATION Right 02/24/2018   Procedure: RIGHT FOOT GREAT TOE AND 2ND TOE AMPUTATION;  Surgeon: Timothy Ford, MD;  Location: [facility] OR;  Service: Orthopedics;  Laterality: Right;   AMPUTATION Right 04/30/2018   Procedure: RIGHT TRANSMETATARSAL AMPUTATION;  Surgeon: Timothy Ford, MD;  Location: [facility] OR;  Service: Orthopedics;  Laterality: Right;   AMPUTATION Right 05/02/2022   Procedure: RIGHT BELOW KNEE AMPUTATION;  Surgeon: Timothy Ford, MD;  Location: [facility] OR;  Service: Orthopedics;  Laterality: Right;   APPLICATION OF WOUND VAC Right 06/13/2022   Procedure: APPLICATION OF WOUND VAC;  Surgeon: Timothy Ford, MD;  Location: [facility] OR;  Service: Orthopedics;  Laterality: Right;   APPLICATION OF WOUND VAC Left 11/14/2022   Procedure:  APPLICATION OF WOUND VAC;  Surgeon: Timothy Ford, MD;  Location: [facility] OR;  Service: Orthopedics;  Laterality: Left;   AV FISTULA PLACEMENT Left 04/02/2022   Procedure: LEFT ARM ARTERIOVENOUS (AV) FISTULA CREATION;  Surgeon: Margherita Shell, MD;  Location: [facility] OR;  Service: Vascular;  Laterality: Left;  PERIPHERAL NERVE BLOCK   AV FISTULA PLACEMENT Right 04/27/2023   Procedure: RIGHT ARM BRACHIOBASILIC ARTERIOVENOUS (AV) FISTULA CREATION;  Surgeon: Carlene Che, MD;  Location: [facility] OR;  Service: Vascular;  Laterality: Right;   BASCILIC VEIN TRANSPOSITION Left 07/31/2022   Procedure: LEFT ARM SECOND STAGE BASILIC VEIN TRANSPOSITION;  Surgeon: Margherita Shell, MD;  Location: [facility] OR;  Service: Vascular;  Laterality: Left;   BIOPSY  05/27/2020   Procedure: BIOPSY;  Surgeon: Vinetta Greening, DO;  Location: [facility];  Service: Endoscopy;;   CATARACT  EXTRACTION, BILATERAL Bilateral ?2013   COLONOSCOPY W/ POLYPECTOMY     COLONOSCOPY WITH PROPOFOL  N/A 03/13/2019   Procedure: COLONOSCOPY WITH PROPOFOL ;  Surgeon: Nannette Babe, MD;  Location: [facility] ENDOSCOPY;  Service: Gastroenterology;  Laterality: N/A;   CORONARY ANGIOPLASTY WITH STENT PLACEMENT  1999; 2007   "1 + 1"   ERCP N/A 02/03/2022   Procedure: ENDOSCOPIC RETROGRADE CHOLANGIOPANCREATOGRAPHY (ERCP);  Surgeon: Alvis Jourdain, MD;  Location: [facility] ENDOSCOPY;  Service: Gastroenterology;  Laterality: N/A;   ESOPHAGOGASTRODUODENOSCOPY N/A 02/12/2023   Procedure: ESOPHAGOGASTRODUODENOSCOPY (EGD);  Surgeon: Alvis Jourdain, MD;  Location: [facility] ENDOSCOPY;  Service: Gastroenterology;  Laterality: N/A;   ESOPHAGOGASTRODUODENOSCOPY (EGD) WITH PROPOFOL  N/A 03/13/2019   Procedure: ESOPHAGOGASTRODUODENOSCOPY (EGD) WITH PROPOFOL ;  Surgeon: Nannette Babe, MD;  Location: [facility] ENDOSCOPY;  Service: Gastroenterology;  Laterality: N/A;   ESOPHAGOGASTRODUODENOSCOPY (EGD) WITH PROPOFOL  N/A 05/27/2020   Procedure: ESOPHAGOGASTRODUODENOSCOPY (EGD) WITH PROPOFOL ;  Surgeon: Vinetta Greening, DO;  [facility];  Service: Endoscopy;  Laterality: N/A;   ESOPHAGOGASTRODUODENOSCOPY (EGD) WITH PROPOFOL  N/A 09/03/2022   Procedure: ESOPHAGOGASTRODUODENOSCOPY (EGD) WITH PROPOFOL ;  Surgeon: Alvis Jourdain, MD;  Location: [facility] ENDOSCOPY;  Service: Gastroenterology;  Laterality: N/A;   EYE SURGERY Bilateral    lazer   FLEXIBLE SIGMOIDOSCOPY N/A 05/23/2022   Procedure: FLEXIBLE SIGMOIDOSCOPY;  Surgeon: Alvis Jourdain, MD;  Location: [facility] ENDOSCOPY;  Service: Gastroenterology;  Laterality: N/A;   FLEXIBLE SIGMOIDOSCOPY N/A 05/24/2022   Procedure: FLEXIBLE SIGMOIDOSCOPY;  Surgeon: Daina Drum, MD;  Location: [facility] ENDOSCOPY;  Service: Gastroenterology;  Laterality: N/A;   FLEXIBLE SIGMOIDOSCOPY N/A 09/03/2022   Procedure: FLEXIBLE SIGMOIDOSCOPY;  Surgeon: Alvis Jourdain, MD;  Location: [facility] ENDOSCOPY;  Service: Gastroenterology;  Laterality:  N/A;   HEMOSTASIS CLIP PLACEMENT  03/13/2019   Procedure: HEMOSTASIS CLIP PLACEMENT;  Surgeon: Nannette Babe, MD;  Location: [facility] ENDOSCOPY;  Service: Gastroenterology;;   HEMOSTASIS CLIP PLACEMENT  05/23/2022   Procedure: HEMOSTASIS CLIP PLACEMENT;  Surgeon: Alvis Jourdain, MD;  Location: [facility] ENDOSCOPY;  Service: Gastroenterology;;   HEMOSTASIS CONTROL  05/24/2022   Procedure: HEMOSTASIS CONTROL;  Surgeon: Daina Drum, MD;  Location: [facility] ENDOSCOPY;  Service: Gastroenterology;;   HOT HEMOSTASIS N/A 05/23/2022   Procedure: HOT HEMOSTASIS (ARGON PLASMA COAGULATION/BICAP);  Surgeon: Alvis Jourdain, MD;  Location: [facility] ENDOSCOPY;  Service: Gastroenterology;  Laterality: N/A;   I & D EXTREMITY Left 05/05/2022   Procedure: IRRIGATION AND DEBRIDEMENT LEFT ARM AV FISTULA;  Surgeon: Young Hensen, MD;  Location: [facility] OR;  Service: Vascular;  Laterality: Left;   I & D EXTREMITY N/A 11/14/2022   Procedure: IRRIGATION AND DEBRIDEMENT OF LOWER EXTREMITY WOUND;  Surgeon: Timothy Ford, MD;  Location: [facility] OR;  Service: Orthopedics;  Laterality: N/A;   INSERTION OF DIALYSIS CATHETER Right 04/02/2022   Procedure: INSERTION OF TUNNELED DIALYSIS CATHETER;  Surgeon: Margherita Shell, MD;  Location: [facility] OR;  Service: Vascular;  Laterality: Right;   IR FLUORO GUIDE CV LINE RIGHT  08/10/2023   IR VENOCAVAGRAM IVC  08/10/2023   KNEE ARTHROSCOPY Left 10/25/2006   POLYPECTOMY  03/13/2019   Procedure: POLYPECTOMY;  Surgeon: Nannette Babe, MD;  Location: [facility] ENDOSCOPY;  Service: Gastroenterology;;   REMOVAL OF STONES  02/03/2022   Procedure: REMOVAL OF STONES;  Surgeon: Alvis Jourdain, MD;  Location: [facility] ENDOSCOPY;  Service: Gastroenterology;;   REVISON OF ARTERIOVENOUS FISTULA Left 08/20/2022   Procedure: REVISON OF LEFT ARM ARTERIOVENOUS FISTULA;  Surgeon: Margherita Shell, MD;  Location: [facility] OR;  Service: Vascular;  Laterality: Left;   RIGHT HEART CATH N/A 07/24/2017   Procedure: RIGHT HEART CATH;  Surgeon: Mardell Shade,  MD;  [facility];  Service: Cardiovascular;  Laterality: N/A;   RIGHT HEART CATHETERIZATION N/A 09/22/2013   Procedure: RIGHT HEART CATH;  Surgeon: Mardell Shade, MD;  Location: [facility] CATH LAB;  Service: Cardiovascular;  Laterality: N/A;   SHOULDER ARTHROSCOPY WITH OPEN ROTATOR CUFF REPAIR Right 03/14/2014   Procedure: RIGHT SHOULDER ARTHROSCOPY WITH BICEPS RELEASE, OPEN SUBSCAPULA REPAIR, OPEN SUPRASPINATUS REPAIR.;  Surgeon: Jasmine Mesi, MD;  Location: [facility] OR;  Service: Orthopedics;  Laterality: Right;   SPHINCTEROTOMY  02/03/2022   Procedure: SPHINCTEROTOMY;  Surgeon: Alvis Jourdain, MD;  Location: [facility] ENDOSCOPY;  Service: Gastroenterology;;   STUMP REVISION Right 06/13/2022   Procedure: REVISION RIGHT BELOW KNEE AMPUTATION;  Surgeon: Timothy Ford, MD;  Location: [facility] OR;  Service: Orthopedics;  Laterality: Right;   TEE WITHOUT CARDIOVERSION N/A 02/04/2022   Procedure: TRANSESOPHAGEAL ECHOCARDIOGRAM (TEE);  Surgeon: Mardell Shade, MD;  Location: [facility] ENDOSCOPY;  Service: Cardiovascular;  Laterality: N/A;   THROMBECTOMY W/ EMBOLECTOMY Left 08/20/2022   Procedure: THROMBECTOMY OF LEFT ARM ARTERIOVENOUS FISTULA;  Surgeon: Margherita Shell, MD;  Location: [facility] OR;  Service: Vascular;  Laterality: Left;   TOE AMPUTATION Right 02/24/2018   GREAT TOE AND 2ND TOE AMPUTATION   TUBAL LIGATION  1970's   UPPER EXTREMITY VENOGRAPHY N/A 07/31/2023   Procedure: UPPER EXTREMITY VENOGRAPHY;  Surgeon: Carlene Che, MD;  [facility]: [facility];  Service: Cardiovascular;  Laterality: N/A;   Patient Active Problem List   Diagnosis Date Noted   Pyelonephritis 08/06/2023   Complicated UTI (urinary tract infection) 08/05/2023   Atrial fibrillation (HCC) 06/17/2023   Atopic dermatitis 06/17/2023   Hyperosmolar hyperglycemic state (HHS) (HCC) 02/05/2023   Gastroparesis 02/05/2023   Depression 01/29/2023   Intractable nausea and vomiting 01/20/2023   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 difficile colitis 06/05/2022   Spleen hematoma 06/05/2022   Dehiscence of amputation stump of right lower extremity (HCC) 06/05/2022   Rectal ulcer 05/27/2022   ESRD (end stage renal disease) (HCC) 05/27/2022   GI  bleed 05/23/2022   Difficult intravenous access 05/23/2022   Gangrene of right foot (HCC) 05/02/2022   S/P BKA (below knee amputation) unilateral, right (HCC) 05/02/2022   Unspecified protein-calorie malnutrition (HCC) 04/15/2022   Secondary hyperparathyroidism of renal origin (HCC) 04/14/2022   Coagulation defect, unspecified (HCC) 04/09/2022   Acquired absence of other left toe(s) (HCC) 04/07/2022   Allergy, unspecified, initial encounter 04/07/2022   Dependence on renal dialysis (HCC) 04/07/2022   Gout due to renal impairment, unspecified site 04/07/2022   Hypertensive heart and chronic kidney disease with heart failure and with stage 5 chronic kidney disease, or end stage renal disease (HCC) 04/07/2022   Personal history of transient ischemic attack (TIA), and cerebral infarction without residual deficits 04/07/2022   Renal osteodystrophy 04/07/2022   Venous stasis ulcer of right calf (HCC) 03/31/2022   Fistula, colovaginal 03/26/2022   Diarrhea 03/26/2022   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   Sepsis without acute organ dysfunction (HCC)    Bacteremia    Acute pancreatitis 02/01/2022   Abdominal pain  02/01/2022   SIRS (systemic inflammatory response syndrome) (HCC) 02/01/2022   Transaminitis 02/01/2022   History of anemia due to chronic kidney disease 02/01/2022   Paroxysmal atrial fibrillation (HCC) 02/01/2022   Uncontrolled type 2 diabetes mellitus with hyperglycemia, with long-term current use of insulin  (HCC) 01/14/2022   NSTEMI (non-ST elevated myocardial infarction) (HCC) 03/05/2021   Acute renal failure superimposed on stage 4 chronic kidney disease (HCC) 08/22/2020   Hypoalbuminemia 05/25/2020   GERD (gastroesophageal reflux disease) 05/25/2020   Pressure injury of skin 05/17/2020   Acute on chronic combined systolic and diastolic congestive heart failure (HCC) 03/07/2020   Type 2 diabetes mellitus with diabetic polyneuropathy, with long-term current use of insulin  (HCC) 03/07/2020   Obesity, Class III, BMI 40-49.9 (morbid obesity) (HCC) 03/07/2020   Common bile duct (CBD) obstruction 05/28/2019   Benign neoplasm of ascending colon    Benign neoplasm of transverse colon    Benign neoplasm of descending colon    Benign neoplasm of sigmoid colon    Gastric polyps    Hyperkalemia 03/11/2019   Prolonged QT interval 03/11/2019   Acute blood loss anemia 03/11/2019   Onychomycosis 06/21/2018   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)    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 (HCC)    PVD (peripheral vascular disease) (HCC) 10/26/2017   E-coli UTI 07/27/2017   Hypothyroidism 07/27/2017   AKI (acute kidney injury) (HCC)    PAH (pulmonary artery hypertension) (HCC)    Impaired ambulation 07/19/2017   Leg cramps 02/27/2017   Peripheral edema 01/12/2017   Diabetic neuropathy (HCC) 11/12/2016   Anemia of chronic disease 10/03/2015   Historical diagnosis of generalized anxiety disorder 10/03/2015   Secondary insomnia 10/03/2015   Acute bronchitis 09/05/2015   Hyperglycemia due to diabetes mellitus (HCC) 06/07/2015   Non compliance with medical  treatment 04/17/2014   Rotator cuff tear 03/14/2014   Obesity 09/23/2013   Chronic HFrEF (heart failure with reduced ejection fraction) (HCC) 06/03/2013   Hypotension 12/25/2012   Hypokalemia 12/25/2012   Hyponatremia 12/25/2012   Urinary incontinence    MDD (major depressive disorder), recurrent episode, moderate (HCC) 11/12/2010   RBBB (right bundle branch block)    Wide-complex tachycardia    Coronary artery disease    Hyperlipemia 01/22/2009   Chronic hypotension 01/22/2009    ONSET DATE: 03/09/2023 MD referral to PT  REFERRING DIAG: S88.111D (ICD-10-CM) - Below-knee amputation of right lower extremity, subsequent encounter  THERAPY DIAG:  Muscle weakness (generalized)  Other abnormalities of gait and mobility  Unsteadiness on feet  Impaired functional mobility, balance, gait, and endurance  Phantom limb syndrome with pain (HCC)  Rationale for Evaluation and Treatment: Rehabilitation  SUBJECTIVE:  SUBJECTIVE STATEMENT: Patient reports right shoulder continues to bother her especially with positioning at dialysis on Saturday    PERTINENT HISTORY: Right TTA 05/02/22, Lymphedema, idiopathic chronic venous HTN with ulcer, depression, colitis, diverticulitis, ESRD, gout, NSTEMI, DM2, polyneuropathy, PVD, CAD, right bundle branch block, mini strokes  PAIN:  Are you having pain?  Back pain today at rest 4-5/10 & standing 5/10 and in last week 4/10 (medication & getting in bed laying flat)  and highest 10/10 (sitting up all day)  Right shoulder today 9/10 and in last week 6/10 (medication Oxy & muscle relaxor) and 10/10 (move wrong or gets bumped)  PRECAUTIONS: Fall and Other: No BP LUE  WEIGHT BEARING RESTRICTIONS: No  FALLS: Has patient fallen in last 6 months? No  LIVING ENVIRONMENT: Lives with: lives with their spouse and 2 small dogs Lives in: mobile home Home Access: Ramped entrance Home layout: One level Stairs: Yes: External: 6-7 steps; can reach both Has  following equipment at home: Single point cane, Environmental consultant - 2 wheeled, Environmental consultant - 4 wheeled, Wheelchair (manual), [employer], Grab bars, and Ramped entry  OCCUPATION:  retired  PLOF: prior to amputation for ~year used RW in home & community  PATIENT GOALS:  to use prosthesis to walk, get out w/c in & out of car, get to bathroom.   OBJECTIVE:  COGNITION: Overall cognitive status:  Eval on 03/23/2023:   Within functional limits for tasks assessed  POSTURE:  Eval on 03/23/2023:  rounded shoulders, forward head, increased thoracic kyphosis, flexed trunk , and weight shift left  LOWER EXTREMITY ROM: ROM Right eval Left eval  Hip flexion    Hip extension    Hip abduction    Hip adduction    Hip internal rotation    Hip external rotation    Knee flexion    Knee extension    Ankle dorsiflexion    Ankle plantarflexion    Ankle inversion    Ankle eversion     (Blank rows = not tested)  LOWER EXTREMITY MMT:  MMT Right eval Left eval Right 09/09/23 Left 09/09/23  Hip flexion 3-/5 3-/5 3/5 3/5  Hip extension 2/5 2/5 3-/5 3-/5  Hip abduction 2+/5 2+/5 3-/5 3-/5  Hip adduction      Hip internal rotation      Hip external rotation      Knee flexion 3-/5 3-/5 3/5 3/5  Knee extension 3-/5 3-/5 3/5 3/5  Ankle dorsiflexion      Ankle plantarflexion      Ankle inversion      Ankle eversion      At Evaluation all strength testing is grossly seated and functionally standing / gait. (Blank rows = not tested)  TRANSFERS: 09/07/2023: Scooting transfers with minA and sit to/from stand transfers with modA.   07/27/2023: -Sit>stand from wc with RW: MinA with heavy vc for sequencing and offweighting LE -Stand>sit wc with RW: CGA with patient performing safe controlled descent  06/15/2023: -Sit to stand transfers with RW wheelchair<>mat table at varied height: 19in with maxA to rise to stand; 22in ModA rise to stand, 24in min-modA rise to stand. -stand pivot transfer with RW w/c to mat table  with modA once standing.  -Lateral scooting wc<>table at level height SBA;  vc for LE positioning for optimal use of LE during transfer -lateral scoot R: modA due to fatigue and uneven surface transfer from low surface to higher surface   05/04/2023: Squat pivot transfers with modA.  04/21/2023: Pt sit to stand w/c to //bar with one hand pushing on w/c and other hand on //bar with modA first time & MinA 2nd/ 3rd time.  Best is RUE on //bar & LUE on w/c.  Stand to sit reaching LUE to w/c with minA to control descent.   PT demo & verbal cues on sit to/from stand w/c to RW.  1st time modA 2 person. 2nd time maxA one person.  Stand to sit with minA and constant cueing. Scooting transfers with minA with w/c & mat direct contact & same ht.   Eval on 03/23/2023:  Sit to stand: Max A (two-person assist) from 22" w/c to rolling walker Stand to sit: Max A (two-person assist) rolling walker to wheelchair  FUNCTIONAL TESTs:   09/09/2023: Patient able to tolerate static stance for 5 minutes with BUE support on RW with supervision.  This was current certification goal set so that patient can stand long enough for her husband to assist with cleaning her after toileting.  07/27/2023: Patient stood with RW SBA for 54sec before c/o lightheadedness and sitting down to toilet. Resisted nudges applied posteriorly to simulate hygienic cleaning. VC for weight shifts when LE become tired.  BP: after standing 19min54sec 161/82 HR 92  06/15/2023: -Static stance for standing with RW; requiring Mod-MaxA for rise to stand d/t patient unsteady on feet posterior lean without proper righting strategy, SBA for stand   04/21/2023: Pt able to stand 60 sec with BUE on //bars with supervision.  Pt able to stand for 1 min with RW support with minA.  Eval on 03/23/2023: Patient maintained upright holding rolling walker with mod assist 2 people for safety for 30 seconds.  GAIT: 09/09/2023: Patient ambulated 30'  with RW including turning 90*to position to sit with mod assistance  07/27/2023: Patient ambulated 50ft with narrow bathroom entry and 90deg turn to sit on commode CGA with minA and heavy vc for progression and sequencing of RW. While turning to sit on commode patient lost balance laterally requiring mod-maxA from therapist and patient use of grab bar to regain balance.   06/17/2023: Patient ambulated 107ft (maximal tolerated distance) with RW ModA (2 person for safety). Required VC for LE sequencing and minA to steer walker and prevent tip over.   04/21/2023: Pt amb 5' in //bars with modA (2 person for safety) with PT verbal & tactile cues on sequence and weight shift.  Pt amb 10' with RW with maxA (2 person for safety) with PT verbal & tactile cues on sequence and weight shift.  Eval on 03/23/2023:  Patient took 2 steps (one per LE) with +2 max assist with rolling walker and TTA prosthesis.  Patient adducting prosthesis stepping on left foot with no awareness.  CURRENT PROSTHETIC WEAR ASSESSMENT: 09/07/2023: Patient and husband are independent with: skin check, prosthetic cleaning, ply sock cleaning, proper wear schedule/adjustment, residual limb care, correct ply sock adjustment, and proper weight-bearing schedule/adjustment Pt reports wear most of awake hours on dialysis and non-dialysis days.  She wears prosthesis to dialysis now to assist with transfers prn and is able to verbalize proper adjustment of prosthesis weight for weigh-in & weigh-out.   06/17/2023:  Independence with prosthetic care:  Patient and husband are independent with: skin check, prosthetic  cleaning, ply sock cleaning, proper wear schedule/adjustment Patient is dependent with: residual limb care, correct ply sock adjustment, and proper weight-bearing schedule/adjustment Donning prosthesis: husband is independent with proper donning. Patient requires MaxA. Patient can verbally direct someone how to properly assist. Doffing  prosthesis: Husband is independent with proper donning. Patient requires modA. Patient can verbally direct someone how to properly assist. Prosthetic wear tolerance: majority of awake hours including dialysis hours, 1x/day, 3-4 days/week Prosthetic weight bearing tolerance: able to tolerate of standing without complains of prosthetic limb pain. Time not limited by limb discomfort but limited by muscle fatigue.   Eval on 03/23/2023:   Patient is dependent with: skin check, residual limb care, prosthetic cleaning, ply sock cleaning, correct ply sock adjustment, proper wear schedule/adjustment, and proper weight-bearing schedule/adjustment Donning prosthesis: Max A Doffing prosthesis: Min A Prosthetic wear tolerance: 2-6 hours, 1x/day, 3-4 days/week Prosthetic weight bearing tolerance: 3 minutes with limb pain Edema: pitting edema Residual limb condition: No open areas, dry skin, normal color and temperature, cylindrical shape Prosthetic description: Silicone liner with pin lock suspension, total contact socket with flexible inner socket, sach foot    TODAY'S TREATMENT:                                                                                                             DATE: 09/14/2023 Prosthetic Training with Transtibial Prosthesis: - Sit to stand 22" w/c and 18" chair with armrest to RW with modA and verbal cues. - Patient ambulated 4' x2 with RW with min assist on a straight path and mod assist for turning to position in front of the chair.  PT demo and verbal cues on sequencing (advancing rolling walker after each footsteps until the back legs of RW are even with her front toes to keep walker closer and improve step through gait pattern for balance). PT demo & verbal cues on sequencing for turning.    Therapeutic Activities: - PT had long discussion/education session on establishing a bowel program including transfers to [facility] and ADLs for cleaning including standing.  Patient and  husband verbalized understanding with plans to initiate currently on the mornings that she does not have dialysis.    TREATMENT:                                                                                                             DATE: 09/09/2023 Prosthetic Training with Transtibial Prosthesis: - See objective data for standing and gait.  Therapeutic Activities: -PT educated verbally and with demo on positioning right upper extremity in sitting and right side-lying.  Patient  and husband verbalized understanding. - Patient performed trunk balance activities seated at edge of wheelchair without back support or arm support.  Back extensor strength leaning forward to upright, abdominal strength leaning back against the back of the wheelchair then sitting up, trunk rotation with L UE cradling RUE at elbow and side bend.  PT explained that all motions were controlled that will assist with getting her right shoulder to move within acceptable ranges which can decrease the chances of it fully freezing.  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   TREATMENT:                                                                                                             DATE: 09/07/2023 Therapeutic Activities: -PT recommended making sure right shoulder is directly over right hand when pressing for transfers.  Scooting transfers with minA and sit to/from stand transfers with modA. Pt did report less spike in pain with this positioning. -Pt able to stand with RW support static for 2 min with supervision.  PT reviewed positioning of RW to limit balance losses posteriorly.   -PT reviewed recommendations for toileting with BSC including transfer, positioning and standing with RW to enable her husband to clean her more thoroughly. Trying to use BSC at consistent time like after breakfast  or dinner may improve her control of bowels.  Pt & husband verbalized understanding.     TREATMENT:                                                                                                             DATE: 08/19/2023 Prosthetic training: -Patient sit>stand x3 maxA requiring multiple attempts and heavy vc for sequencing of LE walk back and UE to RW. Patient unable to walk LLE under herself though she had shifted to the rt appropriately -Continued education on preventing pulling on RW to rise to stand as well as increasing forward lean while standing to reduce instances of backwards LOB. -patient was able to ambulate with minA with RW 40ft before needing to sit in wheelchair. She had sudden onset of need to sit requiring w/c to immediately be brought under her.   - patient doffed prosthetic leg independently without cuing but noted cutoff sock incorrectly as outside layer; adjustments were made to layer of cut off sock with patient and husband verbalizing understanding, patient required minA to don prosthetic leg and vc   Therapeutic Exercise: Blue tband  seated rotations x10ea; vc for proper form Seated marching x10ea without back support but BUE support on w/c armrests Seated knee extension and flexion; x10ea without back support but BUE support on w/c armrests PT added above 2 exercises to HEP with HO, verbal & demo cues.  Pt verbalized understanding.      PATIENT EDUCATION: PATIENT EDUCATED ON FOLLOWING PROSTHETIC CARE: Education details: Use of stockinette under proximal liner to decrease itching sensation, no wounds presents to PT recommended not using Vive wear under liner Skin check, Prosthetic cleaning, Propper donning, and Proper wear schedule/adjustment Prosthetic wear tolerance: 3 hours 2x/day, 4 days/week Person educated: Patient and Spouse Education method: Explanation, Tactile cues, and Verbal cues Education comprehension: verbalized understanding, verbal cues required,  tactile cues required, and needs further education  HOME EXERCISE PROGRAM: Access Code: [email or web address] URL: [email or web address] Date: 08/19/2023 Prepared by: Lorie Rook   Exercises - Supine Bridge  - 1 x daily - 4 x weekly - 2 sets - 5 reps - 2 seconds hold - Supine Lower Trunk Rotation  - 1 x daily - 4 x weekly - 2 sets - 5 reps - 5 seconds hold - Supine Heel Slide with Strap  - 1 x daily - 4 x weekly - 2 sets - 5 reps - 2-3 seconds hold - Supine Hip Abduction  - 1 x daily - 4 x weekly - 2 sets - 5 reps - 2-3 seconds hold - Seated Hip Flexion Toward Target  - 1 x daily - 4 x weekly - 2 sets - 5 reps - 5 seconds hold - Seated Eccentric Abdominal Lean Back  - 1 x daily - 4 x weekly - 2 sets - 5 reps - 5 seconds hold - Seated Sidebending  - 1 x daily - 4 x weekly - 2 sets - 5 reps - 5 seconds hold - Seated Trunk Rotation with Crossed Arms  - 1 x daily - 4 x weekly - 2 sets - 5 reps - 5 seconds hold - Seated March  - 1 x daily - 4 x weekly - 2 sets - 10 reps - 5 seconds hold - Seated Knee Flexion Extension AROM   - 1 x daily - 4 x weekly - 2 sets - 10 reps - 5 seconds hold    ASSESSMENT:  CLINICAL IMPRESSION:  Patient appears to have better understanding of PT recommendations for bowel program and initiating some toileting at [facility] at home.  Patient continues to be fearful at times with walking that her anxiety would negatively impact her safety of trying to ambulate outside of PT skilled care. she is slowly improving but requires skilled PT to improve these to a safe functional level.  OBJECTIVE IMPAIRMENTS: Abnormal gait, decreased activity tolerance, decreased balance, decreased endurance, decreased knowledge of condition, decreased knowledge of use of DME, decreased mobility, difficulty walking, decreased ROM, decreased strength, decreased safety awareness, postural dysfunction, prosthetic dependency , and pain.   ACTIVITY LIMITATIONS: standing, transfers, and locomotion  level  PARTICIPATION LIMITATIONS: community activity, household mobility and dependency / burden of care on family  PERSONAL FACTORS: Age, Fitness, Past/current experiences, Time since onset of injury/illness/exacerbation, and 3+ comorbidities: see PMH  are also affecting patient's functional outcome.   REHAB POTENTIAL: Good  CLINICAL DECISION MAKING: Evolving/moderate complexity  EVALUATION COMPLEXITY: Moderate   GOALS: Goals reviewed with patient? Yes  SHORT TERM GOALS: Target date: 10/07/2023:  Patient & husband verbalized patient able to perform scooting transfer to / from [facility] and sit to/from  stand BSC to RW with husband's assistance safely. Baseline: SEE OBJECTIVE DATA Goal status:   Set 09/09/2023 2.  Patient able to sit to stand w/c to rolling walker with min assistance. Baseline: SEE OBJECTIVE DATA Goal status: Set 09/09/2023  3. Patient ambulates 56ft including turning 90* to position to sit with RW & prosthesis with modA.  Baseline: SEE OBJECTIVE DATA Goal status: Set 09/09/2023  UPDATED LONG TERM GOALS: Target date: 12/03/2023  Patient & husband continues to demonstrate & verbalize understanding of prosthetic care and wears prosthesis >80% of awake hours daily to enable safe utilization of prosthesis. Baseline: SEE OBJECTIVE DATA Goal status: Ongoing 09/07/2023  Patient and husband report patient is able to stand to enable cleaning after toileting safely with no issues Baseline: SEE OBJECTIVE DATA Goal status: SET 06/15/2023  Patient able to perform sit to/from stand transfers with minA.  Baseline: SEE OBJECTIVE DATA Goal status: Updated 09/07/2023  Patient ambulates >93ft including turning 90* to position to sit with prosthesis & RW with minA with husband's assistance safely. Baseline: SEE OBJECTIVE DATA Goal status: Updated 09/09/2023  Patient understanding of ongoing HEP. Baseline: SEE OBJECTIVE DATA Goal status: SET  09/09/2023  PLAN:  PT FREQUENCY:  2x/week  PT DURATION: 12 weeks  PLANNED INTERVENTIONS: 97164- PT Re-evaluation, 97110-Therapeutic exercises, 97530- Therapeutic activity, 97112- Neuromuscular re-education, [phone]- Self Care, 29528- Gait training, [phone]- Prosthetic training, Patient/Family education, Balance training, DME instructions, Therapeutic exercises, Therapeutic activity, Neuromuscular re-education, Gait training, and Self Care  PLAN FOR NEXT SESSION: Continue to work towards STG's, check to she started bowel program and BSC transfers at home.  continue standing tolerance and ambulation with turn 90deg to sit, check if she tried scooting transfer to [facility] at home.   Cheyanna Strick, PT, DPT 09/14/2023, 4:06 PM

## 2023-09-15 NOTE — Telephone Encounter (Signed)
 Susan [name]

## 2023-09-16 ENCOUNTER — Encounter: Payer: Self-pay | Admitting: Physical Therapy

## 2023-09-16 ENCOUNTER — Ambulatory Visit: Admitting: Physical Therapy

## 2023-09-16 DIAGNOSIS — R2681 Unsteadiness on feet: Secondary | ICD-10-CM

## 2023-09-16 DIAGNOSIS — I2721 Secondary pulmonary arterial hypertension: Secondary | ICD-10-CM | POA: Diagnosis not present

## 2023-09-16 DIAGNOSIS — I5022 Chronic systolic (congestive) heart failure: Secondary | ICD-10-CM | POA: Diagnosis not present

## 2023-09-16 DIAGNOSIS — R2689 Other abnormalities of gait and mobility: Secondary | ICD-10-CM | POA: Diagnosis not present

## 2023-09-16 DIAGNOSIS — M6281 Muscle weakness (generalized): Secondary | ICD-10-CM | POA: Diagnosis not present

## 2023-09-16 DIAGNOSIS — Z7409 Other reduced mobility: Secondary | ICD-10-CM

## 2023-09-16 NOTE — Therapy (Signed)
 OUTPATIENT PHYSICAL THERAPY PROSTHETIC TREATMENT  Patient Name: Susan [name] MRN: [id number] DOB:Jun 19, 1950, 73 y.o., female Today's Date: 09/16/2023  PCP: Austine Lefort, MD  REFERRING PROVIDER:  Gearldean Keepers, MD  END OF SESSION:  PT End of Session - 09/16/23 1437     Visit Number 38    Number of Visits 60    Date for PT Re-Evaluation 12/03/23    Authorization Type [employer]    Progress Note Due on Visit 46    PT Start Time 1430    PT Stop Time 1515    PT Time Calculation (min) 45 min    Equipment Utilized During Treatment Gait belt    Activity Tolerance Patient tolerated treatment well;Patient limited by fatigue    Behavior During Therapy WFL for tasks assessed/performed                    Past Medical History:  Diagnosis Date   Anemia    hx   Anxiety    Arthritis    "generalized" (03/15/2014)   CAD (coronary artery disease)    MI in 2000 - MI  2007 - treated bare metal stent (no nuclear since then as 9/11)   Carotid artery disease (HCC)    Chronic diastolic heart failure (HCC)    a) ECHO (08/2013) EF 55-60% and RV function nl b) RHC (08/2013) RA 4, RV 30/5/7, PA 25/10 (16), PCWP 7, Fick CO/CI 6.3/2.7, PVR 1.5 WU, PA 61 and 66%   Daily headache    "~ every other day; since I fell in June" (03/15/2014)   Depression    Diabetic retinopathy (HCC)    Dyslipidemia    ESRD (end stage renal disease) (HCC)    Dialysis on Tues Thurs Sat   Exertional shortness of breath    GERD (gastroesophageal reflux disease)    History of blood transfusion    History of kidney stones    HTN (hypertension)    Hypothyroidism    Myocardial infarction (HCC)    Obesity    Osteoarthritis    PAF (paroxysmal atrial fibrillation) (HCC)    Peripheral neuropathy    bilateral feet/hands   PONV (postoperative nausea and vomiting)    RBBB (right bundle branch block)    Old   Stroke (HCC)    mini strokes   Type II diabetes mellitus (HCC)    Type II, [name]  libre left upper arm. patient has omnipod insulin  pump with Novolin R Insulin    Past Surgical History:  Procedure Laterality Date   A/V FISTULAGRAM Left 11/07/2022   Procedure: A/V Fistulagram;  Surgeon: Carlene Che, MD;  [facility]: [facility];  Service: Cardiovascular;  Laterality: Left;   ABDOMINAL HYSTERECTOMY  1980's   AMPUTATION Right 02/24/2018   Procedure: RIGHT FOOT GREAT TOE AND 2ND TOE AMPUTATION;  Surgeon: Timothy Ford, MD;  Location: [facility] OR;  Service: Orthopedics;  Laterality: Right;   AMPUTATION Right 04/30/2018   Procedure: RIGHT TRANSMETATARSAL AMPUTATION;  Surgeon: Timothy Ford, MD;  Location: [facility] OR;  Service: Orthopedics;  Laterality: Right;   AMPUTATION Right 05/02/2022   Procedure: RIGHT BELOW KNEE AMPUTATION;  Surgeon: Timothy Ford, MD;  Location: [facility] OR;  Service: Orthopedics;  Laterality: Right;   APPLICATION OF WOUND VAC Right 06/13/2022   Procedure: APPLICATION OF WOUND VAC;  Surgeon: Timothy Ford, MD;  [facility]: [facility] OR;  Service: Orthopedics;  Laterality: Right;   APPLICATION OF WOUND VAC Left 11/14/2022  Procedure: APPLICATION OF WOUND VAC;  Surgeon: Timothy Ford, MD;  Location: [facility] OR;  Service: Orthopedics;  Laterality: Left;   AV FISTULA PLACEMENT Left 04/02/2022   Procedure: LEFT ARM ARTERIOVENOUS (AV) FISTULA CREATION;  Surgeon: Margherita Shell, MD;  Location: [facility] OR;  Service: Vascular;  Laterality: Left;  PERIPHERAL NERVE BLOCK   AV FISTULA PLACEMENT Right 04/27/2023   Procedure: RIGHT ARM BRACHIOBASILIC ARTERIOVENOUS (AV) FISTULA CREATION;  Surgeon: Carlene Che, MD;  Location: [facility] OR;  Service: Vascular;  Laterality: Right;   BASCILIC VEIN TRANSPOSITION Left 07/31/2022   Procedure: LEFT ARM SECOND STAGE BASILIC VEIN TRANSPOSITION;  Surgeon: Margherita Shell, MD;  [facility]: [facility] OR;  Service: Vascular;  Laterality: Left;   BIOPSY  05/27/2020   Procedure: BIOPSY;  Surgeon: Vinetta Greening, DO;  [facility]: [facility];  Service: Endoscopy;;    CATARACT EXTRACTION, BILATERAL Bilateral ?2013   COLONOSCOPY W/ POLYPECTOMY     COLONOSCOPY WITH PROPOFOL  N/A 03/13/2019   Procedure: COLONOSCOPY WITH PROPOFOL ;  Surgeon: Nannette Babe, MD;  Location: [facility] ENDOSCOPY;  Service: Gastroenterology;  Laterality: N/A;   CORONARY ANGIOPLASTY WITH STENT PLACEMENT  1999; 2007   "1 + 1"   ERCP N/A 02/03/2022   Procedure: ENDOSCOPIC RETROGRADE CHOLANGIOPANCREATOGRAPHY (ERCP);  Surgeon: Alvis Jourdain, MD;  Location: [facility] ENDOSCOPY;  Service: Gastroenterology;  Laterality: N/A;   ESOPHAGOGASTRODUODENOSCOPY N/A 02/12/2023   Procedure: ESOPHAGOGASTRODUODENOSCOPY (EGD);  Surgeon: Alvis Jourdain, MD;  Location: [facility] ENDOSCOPY;  Service: Gastroenterology;  Laterality: N/A;   ESOPHAGOGASTRODUODENOSCOPY (EGD) WITH PROPOFOL  N/A 03/13/2019   Procedure: ESOPHAGOGASTRODUODENOSCOPY (EGD) WITH PROPOFOL ;  Surgeon: Nannette Babe, MD;  Location: [facility] ENDOSCOPY;  Service: Gastroenterology;  Laterality: N/A;   ESOPHAGOGASTRODUODENOSCOPY (EGD) WITH PROPOFOL  N/A 05/27/2020   Procedure: ESOPHAGOGASTRODUODENOSCOPY (EGD) WITH PROPOFOL ;  Surgeon: Vinetta Greening, DO;  [facility];  Service: Endoscopy;  Laterality: N/A;   ESOPHAGOGASTRODUODENOSCOPY (EGD) WITH PROPOFOL  N/A 09/03/2022   Procedure: ESOPHAGOGASTRODUODENOSCOPY (EGD) WITH PROPOFOL ;  Surgeon: Alvis Jourdain, MD;  Location: [facility] ENDOSCOPY;  Service: Gastroenterology;  Laterality: N/A;   EYE SURGERY Bilateral    lazer   FLEXIBLE SIGMOIDOSCOPY N/A 05/23/2022   Procedure: FLEXIBLE SIGMOIDOSCOPY;  Surgeon: Alvis Jourdain, MD;  Location: [facility] ENDOSCOPY;  Service: Gastroenterology;  Laterality: N/A;   FLEXIBLE SIGMOIDOSCOPY N/A 05/24/2022   Procedure: FLEXIBLE SIGMOIDOSCOPY;  Surgeon: Daina Drum, MD;  Location: [facility] ENDOSCOPY;  Service: Gastroenterology;  Laterality: N/A;   FLEXIBLE SIGMOIDOSCOPY N/A 09/03/2022   Procedure: FLEXIBLE SIGMOIDOSCOPY;  Surgeon: Alvis Jourdain, MD;  Location: [facility] ENDOSCOPY;  Service: Gastroenterology;   Laterality: N/A;   HEMOSTASIS CLIP PLACEMENT  03/13/2019   Procedure: HEMOSTASIS CLIP PLACEMENT;  Surgeon: Nannette Babe, MD;  Location: [facility] ENDOSCOPY;  Service: Gastroenterology;;   HEMOSTASIS CLIP PLACEMENT  05/23/2022   Procedure: HEMOSTASIS CLIP PLACEMENT;  Surgeon: Alvis Jourdain, MD;  Location: [facility] ENDOSCOPY;  Service: Gastroenterology;;   HEMOSTASIS CONTROL  05/24/2022   Procedure: HEMOSTASIS CONTROL;  Surgeon: Daina Drum, MD;  Location: [facility] ENDOSCOPY;  Service: Gastroenterology;;   HOT HEMOSTASIS N/A 05/23/2022   Procedure: HOT HEMOSTASIS (ARGON PLASMA COAGULATION/BICAP);  Surgeon: Alvis Jourdain, MD;  Location: [facility] ENDOSCOPY;  Service: Gastroenterology;  Laterality: N/A;   I & D EXTREMITY Left 05/05/2022   Procedure: IRRIGATION AND DEBRIDEMENT LEFT ARM AV FISTULA;  Surgeon: Young Hensen, MD;  Location: [facility] OR;  Service: Vascular;  Laterality: Left;   I & D EXTREMITY N/A 11/14/2022   Procedure: IRRIGATION AND DEBRIDEMENT OF LOWER EXTREMITY WOUND;  Surgeon: Timothy Ford,  MD;  Location: [facility] OR;  Service: Orthopedics;  Laterality: N/A;   INSERTION OF DIALYSIS CATHETER Right 04/02/2022   Procedure: INSERTION OF TUNNELED DIALYSIS CATHETER;  Surgeon: Margherita Shell, MD;  Location: [facility] OR;  Service: Vascular;  Laterality: Right;   IR FLUORO GUIDE CV LINE RIGHT  08/10/2023   IR VENOCAVAGRAM IVC  08/10/2023   KNEE ARTHROSCOPY Left 10/25/2006   POLYPECTOMY  03/13/2019   Procedure: POLYPECTOMY;  Surgeon: Nannette Babe, MD;  Location: [facility] ENDOSCOPY;  Service: Gastroenterology;;   REMOVAL OF STONES  02/03/2022   Procedure: REMOVAL OF STONES;  Surgeon: Alvis Jourdain, MD;  Location: [facility] ENDOSCOPY;  Service: Gastroenterology;;   REVISON OF ARTERIOVENOUS FISTULA Left 08/20/2022   Procedure: REVISON OF LEFT ARM ARTERIOVENOUS FISTULA;  Surgeon: Margherita Shell, MD;  [facility];  Service: Vascular;  Laterality: Left;   RIGHT HEART CATH N/A 07/24/2017   Procedure: RIGHT HEART CATH;  Surgeon:  Mardell Shade, MD;  [facility];  Service: Cardiovascular;  Laterality: N/A;   RIGHT HEART CATHETERIZATION N/A 09/22/2013   Procedure: RIGHT HEART CATH;  Surgeon: Mardell Shade, MD;  Location: [facility] CATH LAB;  Service: Cardiovascular;  Laterality: N/A;   SHOULDER ARTHROSCOPY WITH OPEN ROTATOR CUFF REPAIR Right 03/14/2014   Procedure: RIGHT SHOULDER ARTHROSCOPY WITH BICEPS RELEASE, OPEN SUBSCAPULA REPAIR, OPEN SUPRASPINATUS REPAIR.;  Surgeon: Jasmine Mesi, MD;  Location: [facility] OR;  Service: Orthopedics;  Laterality: Right;   SPHINCTEROTOMY  02/03/2022   Procedure: SPHINCTEROTOMY;  Surgeon: Alvis Jourdain, MD;  Location: [facility] ENDOSCOPY;  Service: Gastroenterology;;   STUMP REVISION Right 06/13/2022   Procedure: REVISION RIGHT BELOW KNEE AMPUTATION;  Surgeon: Timothy Ford, MD;  Location: [facility] OR;  Service: Orthopedics;  Laterality: Right;   TEE WITHOUT CARDIOVERSION N/A 02/04/2022   Procedure: TRANSESOPHAGEAL ECHOCARDIOGRAM (TEE);  Surgeon: Mardell Shade, MD;  Location: [facility] ENDOSCOPY;  Service: Cardiovascular;  Laterality: N/A;   THROMBECTOMY W/ EMBOLECTOMY Left 08/20/2022   Procedure: THROMBECTOMY OF LEFT ARM ARTERIOVENOUS FISTULA;  Surgeon: Margherita Shell, MD;  Location: [facility] OR;  Service: Vascular;  Laterality: Left;   TOE AMPUTATION Right 02/24/2018   GREAT TOE AND 2ND TOE AMPUTATION   TUBAL LIGATION  1970's   UPPER EXTREMITY VENOGRAPHY N/A 07/31/2023   Procedure: UPPER EXTREMITY VENOGRAPHY;  Surgeon: Carlene Che, MD;  [facility]: [facility];  Service: Cardiovascular;  Laterality: N/A;   Patient Active Problem List   Diagnosis Date Noted   Pyelonephritis 08/06/2023   Complicated UTI (urinary tract infection) 08/05/2023   Atrial fibrillation (HCC) 06/17/2023   Atopic dermatitis 06/17/2023   Hyperosmolar hyperglycemic state (HHS) (HCC) 02/05/2023   Gastroparesis 02/05/2023   Depression 01/29/2023   Intractable nausea and vomiting 01/20/2023   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 difficile colitis 06/05/2022   Spleen hematoma 06/05/2022   Dehiscence of amputation stump of right lower extremity (HCC) 06/05/2022   Rectal ulcer 05/27/2022   ESRD (end stage renal disease) (HCC) 05/27/2022  GI bleed 05/23/2022   Difficult intravenous access 05/23/2022   Gangrene of right foot (HCC) 05/02/2022   S/P BKA (below knee amputation) unilateral, right (HCC) 05/02/2022   Unspecified protein-calorie malnutrition (HCC) 04/15/2022   Secondary hyperparathyroidism of renal origin (HCC) 04/14/2022   Coagulation defect, unspecified (HCC) 04/09/2022   Acquired absence of other left toe(s) (HCC) 04/07/2022   Allergy, unspecified, initial encounter 04/07/2022   Dependence on renal dialysis (HCC) 04/07/2022   Gout due to renal impairment, unspecified site 04/07/2022   Hypertensive heart and chronic kidney disease with heart failure and with stage 5 chronic kidney disease, or end stage renal disease (HCC) 04/07/2022   Personal history of transient ischemic attack (TIA), and cerebral infarction without residual deficits 04/07/2022   Renal osteodystrophy 04/07/2022   Venous stasis ulcer of right calf (HCC) 03/31/2022   Fistula, colovaginal 03/26/2022   Diarrhea 03/26/2022   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   Sepsis without acute organ dysfunction (HCC)    Bacteremia    Acute pancreatitis 02/01/2022    Abdominal pain 02/01/2022   SIRS (systemic inflammatory response syndrome) (HCC) 02/01/2022   Transaminitis 02/01/2022   History of anemia due to chronic kidney disease 02/01/2022   Paroxysmal atrial fibrillation (HCC) 02/01/2022   Uncontrolled type 2 diabetes mellitus with hyperglycemia, with long-term current use of insulin  (HCC) 01/14/2022   NSTEMI (non-ST elevated myocardial infarction) (HCC) 03/05/2021   Acute renal failure superimposed on stage 4 chronic kidney disease (HCC) 08/22/2020   Hypoalbuminemia 05/25/2020   GERD (gastroesophageal reflux disease) 05/25/2020   Pressure injury of skin 05/17/2020   Acute on chronic combined systolic and diastolic congestive heart failure (HCC) 03/07/2020   Type 2 diabetes mellitus with diabetic polyneuropathy, with long-term current use of insulin  (HCC) 03/07/2020   Obesity, Class III, BMI 40-49.9 (morbid obesity) (HCC) 03/07/2020   Common bile duct (CBD) obstruction 05/28/2019   Benign neoplasm of ascending colon    Benign neoplasm of transverse colon    Benign neoplasm of descending colon    Benign neoplasm of sigmoid colon    Gastric polyps    Hyperkalemia 03/11/2019   Prolonged QT interval 03/11/2019   Acute blood loss anemia 03/11/2019   Onychomycosis 06/21/2018   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)    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 (HCC)    PVD (peripheral vascular disease) (HCC) 10/26/2017   E-coli UTI 07/27/2017   Hypothyroidism 07/27/2017   AKI (acute kidney injury) (HCC)    PAH (pulmonary artery hypertension) (HCC)    Impaired ambulation 07/19/2017   Leg cramps 02/27/2017   Peripheral edema 01/12/2017   Diabetic neuropathy (HCC) 11/12/2016   Anemia of chronic disease 10/03/2015   Historical diagnosis of generalized anxiety disorder 10/03/2015   Secondary insomnia 10/03/2015   Acute bronchitis 09/05/2015   Hyperglycemia due to diabetes mellitus (HCC) 06/07/2015   Non compliance  with medical treatment 04/17/2014   Rotator cuff tear 03/14/2014   Obesity 09/23/2013   Chronic HFrEF (heart failure with reduced ejection fraction) (HCC) 06/03/2013   Hypotension 12/25/2012   Hypokalemia 12/25/2012   Hyponatremia 12/25/2012   Urinary incontinence    MDD (major depressive disorder), recurrent episode, moderate (HCC) 11/12/2010   RBBB (right bundle branch block)    Wide-complex tachycardia    Coronary artery disease    Hyperlipemia 01/22/2009   Chronic hypotension 01/22/2009    ONSET DATE: 03/09/2023 MD referral to PT  REFERRING DIAG: S88.111D (ICD-10-CM) - Below-knee amputation of right lower extremity, subsequent  encounter   THERAPY DIAG:  Muscle weakness (generalized)  Other abnormalities of gait and mobility  Unsteadiness on feet  Impaired functional mobility, balance, gait, and endurance  Rationale for Evaluation and Treatment: Rehabilitation  SUBJECTIVE:  SUBJECTIVE STATEMENT: She was able to get on/off BSC and stand up with RW for husband to manage clothes.  But was not able to have a BM.    PERTINENT HISTORY: Right TTA 05/02/22, Lymphedema, idiopathic chronic venous HTN with ulcer, depression, colitis, diverticulitis, ESRD, gout, NSTEMI, DM2, polyneuropathy, PVD, CAD, right bundle branch block, mini strokes  PAIN:  Are you having pain?  Back pain today at rest   4-5/10 & standing 5/10 and in last week 4/10 (medication & getting in bed laying flat)  and highest 10/10 (sitting up all day)  Right shoulder today 9/10 and in last week 6/10 (medication Oxy & muscle relaxor) and 10/10 (move wrong or gets bumped)  PRECAUTIONS: Fall and Other: No BP LUE  WEIGHT BEARING RESTRICTIONS: No  FALLS: Has patient fallen in last 6 months? No  LIVING ENVIRONMENT: Lives with: lives with their spouse and 2 small dogs Lives in: mobile home Home Access: Ramped entrance Home layout: One level Stairs: Yes: External: 6-7 steps; can reach both Has following  equipment at home: Single point cane, Environmental consultant - 2 wheeled, Environmental consultant - 4 wheeled, Wheelchair (manual), [employer], Grab bars, and Ramped entry  OCCUPATION:  retired  PLOF: prior to amputation for ~year used RW in home & community  PATIENT GOALS:  to use prosthesis to walk, get out w/c in & out of car, get to bathroom.   OBJECTIVE:  COGNITION: Overall cognitive status:  Eval on 03/23/2023:   Within functional limits for tasks assessed  POSTURE:  Eval on 03/23/2023:  rounded shoulders, forward head, increased thoracic kyphosis, flexed trunk , and weight shift left  LOWER EXTREMITY ROM: ROM Right eval Left eval  Hip flexion    Hip extension    Hip abduction    Hip adduction    Hip internal rotation    Hip external rotation    Knee flexion    Knee extension    Ankle dorsiflexion    Ankle plantarflexion    Ankle inversion    Ankle eversion     (Blank rows = not tested)  LOWER EXTREMITY MMT:  MMT Right eval Left eval Right 09/09/23 Left 09/09/23  Hip flexion 3-/5 3-/5 3/5 3/5  Hip extension 2/5 2/5 3-/5 3-/5  Hip abduction 2+/5 2+/5 3-/5 3-/5  Hip adduction      Hip internal rotation      Hip external rotation      Knee flexion 3-/5 3-/5 3/5 3/5  Knee extension 3-/5 3-/5 3/5 3/5  Ankle dorsiflexion      Ankle plantarflexion      Ankle inversion      Ankle eversion      At Evaluation all strength testing is grossly seated and functionally standing / gait. (Blank rows = not tested)  TRANSFERS: 09/07/2023: Scooting transfers with minA and sit to/from stand transfers with modA.   07/27/2023: -Sit>stand from wc with RW: MinA with heavy vc for sequencing and offweighting LE -Stand>sit wc with RW: CGA with patient performing safe controlled descent  06/15/2023: -Sit to stand transfers with RW wheelchair<>mat table at varied height: 19in with maxA to rise to stand; 22in ModA rise to stand, 24in min-modA rise to stand. -stand pivot transfer with RW w/c to mat table with modA  once  standing.  -Lateral scooting wc<>table at level height SBA; vc for LE positioning for optimal use of LE during transfer -lateral scoot R: modA due to fatigue and uneven surface transfer from low surface to higher surface   05/04/2023: Squat pivot transfers with modA.  04/21/2023: Pt sit to stand w/c to //bar with one hand pushing on w/c and other hand on //bar with modA first time & MinA 2nd/ 3rd time.  Best is RUE on //bar & LUE on w/c.  Stand to sit reaching LUE to w/c with minA to control descent.   PT demo & verbal cues on sit to/from stand w/c to RW.  1st time modA 2 person. 2nd time maxA one person.  Stand to sit with minA and constant cueing. Scooting transfers with minA with w/c & mat direct contact & same ht.   Eval on 03/23/2023:  Sit to stand: Max A (two-person assist) from 22" w/c to rolling walker Stand to sit: Max A (two-person assist) rolling walker to wheelchair  FUNCTIONAL TESTs:   09/09/2023: Patient able to tolerate static stance for 5 minutes with BUE support on RW with supervision.  This was current certification goal set so that patient can stand long enough for her husband to assist with cleaning her after toileting.  07/27/2023: Patient stood with RW SBA for 54sec before c/o lightheadedness and sitting down to toilet. Resisted nudges applied posteriorly to simulate hygienic cleaning. VC for weight shifts when LE become tired.  BP: after standing 12min54sec 161/82 HR 92  06/15/2023: -Static stance for standing with RW; requiring Mod-MaxA for rise to stand d/t patient unsteady on feet posterior lean without proper righting strategy, SBA for stand   04/21/2023: Pt able to stand 60 sec with BUE on //bars with supervision.  Pt able to stand for 1 min with RW support with minA.  Eval on 03/23/2023: Patient maintained upright holding rolling walker with mod assist 2 people for safety for 30 seconds.  GAIT: 09/09/2023: Patient ambulated 30' with RW  including turning 90*to position to sit with mod assistance  07/27/2023: Patient ambulated 26ft with narrow bathroom entry and 90deg turn to sit on commode CGA with minA and heavy vc for progression and sequencing of RW. While turning to sit on commode patient lost balance laterally requiring mod-maxA from therapist and patient use of grab bar to regain balance.   06/17/2023: Patient ambulated 63ft (maximal tolerated distance) with RW ModA (2 person for safety). Required VC for LE sequencing and minA to steer walker and prevent tip over.   04/21/2023: Pt amb 5' in //bars with modA (2 person for safety) with PT verbal & tactile cues on sequence and weight shift.  Pt amb 10' with RW with maxA (2 person for safety) with PT verbal & tactile cues on sequence and weight shift.  Eval on 03/23/2023:  Patient took 2 steps (one per LE) with +2 max assist with rolling walker and TTA prosthesis.  Patient adducting prosthesis stepping on left foot with no awareness.  CURRENT PROSTHETIC WEAR ASSESSMENT: 09/07/2023: Patient and husband are independent with: skin check, prosthetic cleaning, ply sock cleaning, proper wear schedule/adjustment, residual limb care, correct ply sock adjustment, and proper weight-bearing schedule/adjustment Pt reports wear most of awake hours on dialysis and non-dialysis days.  She wears prosthesis to dialysis now to assist with transfers prn and is able to verbalize proper adjustment of prosthesis weight for weigh-in & weigh-out.   06/17/2023:  Independence with prosthetic care:  Patient and husband are independent with: skin check, prosthetic cleaning, ply sock cleaning, proper wear schedule/adjustment Patient is dependent with: residual limb care, correct ply sock adjustment, and proper weight-bearing schedule/adjustment Donning prosthesis: husband is independent with proper donning. Patient requires MaxA. Patient can verbally direct someone how to properly assist. Doffing prosthesis:  Husband is independent with proper donning. Patient requires modA. Patient can verbally direct someone how to properly assist. Prosthetic wear tolerance: majority of awake hours including dialysis hours, 1x/day, 3-4 days/week Prosthetic weight bearing tolerance: able to tolerate of standing without complains of prosthetic limb pain. Time not limited by limb discomfort but limited by muscle fatigue.   Eval on 03/23/2023:   Patient is dependent with: skin check, residual limb care, prosthetic cleaning, ply sock cleaning, correct ply sock adjustment, proper wear schedule/adjustment, and proper weight-bearing schedule/adjustment Donning prosthesis: Max A Doffing prosthesis: Min A Prosthetic wear tolerance: 2-6 hours, 1x/day, 3-4 days/week Prosthetic weight bearing tolerance: 3 minutes with limb pain Edema: pitting edema Residual limb condition: No open areas, dry skin, normal color and temperature, cylindrical shape Prosthetic description: Silicone liner with pin lock suspension, total contact socket with flexible inner socket, sach foot    TODAY'S TREATMENT:                                                                                                             DATE: 09/16/2023 Prosthetic Training with Transtibial Prosthesis: - PT demo & verbal cues on sequencing for straight path gait with RW, turning 90* to right & to left to position to sit.  Pt's husband took video on his phone.  PT recommended viewing the 3 videos to help remember the proper technique.  (advancing rolling walker after each footsteps until the back legs of RW are even with her front toes to keep walker closer and improve step through gait pattern for balance).  Patient and her husband verbalized understanding. -pt amb 10' & 71' with RW with minA for straight path and modA to turn 90* to position to sit.    Therapeutic Exercise: - PT demo lateral trunk bend to right side with patient protecting right arm due to  significant pain from rotator cuff tear.  Patient performed 10 reps with no increased pain. - Patient performed 10 reps alternating hip flexion/marching.   TREATMENT:                                                                                                             DATE: 09/14/2023 Prosthetic Training with Transtibial Prosthesis: - Sit to stand 22" w/c and 18" chair  with armrest to RW with modA and verbal cues. - Patient ambulated 49' x2 with RW with min assist on a straight path and mod assist for turning to position in front of the chair.  PT demo and verbal cues on sequencing (advancing rolling walker after each footsteps until the back legs of RW are even with her front toes to keep walker closer and improve step through gait pattern for balance). PT demo & verbal cues on sequencing for turning.    Therapeutic Activities: - PT had long discussion/education session on establishing a bowel program including transfers to [facility] and ADLs for cleaning including standing.  Patient and husband verbalized understanding with plans to initiate currently on the mornings that she does not have dialysis.    TREATMENT:                                                                                                             DATE: 09/09/2023 Prosthetic Training with Transtibial Prosthesis: - See objective data for standing and gait.  Therapeutic Activities: -PT educated verbally and with demo on positioning right upper extremity in sitting and right side-lying.  Patient and husband verbalized understanding. - Patient performed trunk balance activities seated at edge of wheelchair without back support or arm support.  Back extensor strength leaning forward to upright, abdominal strength leaning back against the back of the wheelchair then sitting up, trunk rotation with L UE cradling RUE at elbow and side bend.  PT explained that all motions were controlled that will assist with getting her right  shoulder to move within acceptable ranges which can decrease the chances of it fully freezing.  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    PATIENT EDUCATION: PATIENT EDUCATED ON FOLLOWING PROSTHETIC CARE: Education details: Use of stockinette under proximal liner to decrease itching sensation, no wounds presents to PT recommended not using Vive wear under liner Skin check, Prosthetic cleaning, Propper donning, and Proper wear schedule/adjustment Prosthetic wear tolerance: 3 hours 2x/day, 4 days/week Person educated: Patient and Spouse Education method: Explanation, Tactile cues, and Verbal cues Education comprehension: verbalized understanding, verbal cues required, tactile cues required, and needs further education  HOME EXERCISE PROGRAM: Access Code: [email or web address] URL: [email or web address] Date: 08/19/2023 Prepared by: Lorie Rook   Exercises - Supine Bridge  - 1 x daily - 4 x weekly - 2 sets - 5 reps - 2 seconds hold - Supine Lower Trunk Rotation  - 1 x daily - 4 x weekly - 2 sets - 5 reps - 5 seconds hold - Supine Heel Slide with Strap  - 1 x daily - 4 x weekly - 2 sets - 5 reps - 2-3 seconds hold - Supine Hip Abduction  - 1 x daily - 4 x weekly - 2 sets - 5 reps - 2-3 seconds hold - Seated Hip Flexion Toward Target  - 1 x daily - 4 x  weekly - 2 sets - 5 reps - 5 seconds hold - Seated Eccentric Abdominal Lean Back  - 1 x daily - 4 x weekly - 2 sets - 5 reps - 5 seconds hold - Seated Sidebending  - 1 x daily - 4 x weekly - 2 sets - 5 reps - 5 seconds hold - Seated Trunk Rotation with Crossed Arms  - 1 x daily - 4 x weekly - 2 sets - 5 reps - 5 seconds hold - Seated March  - 1 x daily - 4 x weekly - 2 sets - 10 reps - 5 seconds hold - Seated Knee Flexion Extension AROM   - 1 x daily - 4 x weekly - 2 sets - 10 reps - 5 seconds hold     ASSESSMENT:  CLINICAL IMPRESSION:  Patient should benefit from watching videos that demonstrated proper gait sequencing.  Patient and husband report improvement with transfers to and from bedside commode.   OBJECTIVE IMPAIRMENTS: Abnormal gait, decreased activity tolerance, decreased balance, decreased endurance, decreased knowledge of condition, decreased knowledge of use of DME, decreased mobility, difficulty walking, decreased ROM, decreased strength, decreased safety awareness, postural dysfunction, prosthetic dependency , and pain.   ACTIVITY LIMITATIONS: standing, transfers, and locomotion level  PARTICIPATION LIMITATIONS: community activity, household mobility and dependency / burden of care on family  PERSONAL FACTORS: Age, Fitness, Past/current experiences, Time since onset of injury/illness/exacerbation, and 3+ comorbidities: see PMH  are also affecting patient's functional outcome.   REHAB POTENTIAL: Good  CLINICAL DECISION MAKING: Evolving/moderate complexity  EVALUATION COMPLEXITY: Moderate   GOALS: Goals reviewed with patient? Yes  SHORT TERM GOALS: Target date: 10/07/2023:  Patient & husband verbalized patient able to perform scooting transfer to / from [facility] and sit to/from stand [facility] to RW with husband's assistance safely. Baseline: SEE OBJECTIVE DATA Goal status:   Ongoing  09/16/2023 2.  Patient able to sit to stand w/c to rolling walker with min assistance. Baseline: SEE OBJECTIVE DATA Goal status: Ongoing  09/16/2023  3. Patient ambulates 45ft including turning 90* to position to sit with RW & prosthesis with modA.  Baseline: Ongoing  09/16/2023  UPDATED LONG TERM GOALS: Target date: 12/03/2023  Patient & husband continues to demonstrate & verbalize understanding of prosthetic care and wears prosthesis >80% of awake hours daily to enable safe utilization of prosthesis. Baseline: SEE OBJECTIVE DATA Goal status: Ongoing  09/16/2023  Patient and husband report  patient is able to stand to enable cleaning after toileting safely with no issues Baseline: SEE OBJECTIVE DATA Goal status: Ongoing  09/16/2023  Patient able to perform sit to/from stand transfers with minA.  Baseline: SEE OBJECTIVE DATA Goal status: Ongoing  09/16/2023  Patient ambulates >51ft including turning 90* to position to sit with prosthesis & RW with minA with husband's assistance safely. Baseline: SEE OBJECTIVE DATA Goal status: Ongoing  09/16/2023  Patient understanding of ongoing HEP. Baseline: SEE OBJECTIVE DATA Goal status:  Ongoing  09/16/2023  PLAN:  PT FREQUENCY: 2x/week  PT DURATION: 12 weeks  PLANNED INTERVENTIONS: 97164- PT Re-evaluation, 97110-Therapeutic exercises, 97530- Therapeutic activity, 97112- Neuromuscular re-education, [phone]- Self Care, 60454- Gait training, [phone]- Prosthetic training, Patient/Family education, Balance training, DME instructions, Therapeutic exercises, Therapeutic activity, Neuromuscular re-education, Gait training, and Self Care  PLAN FOR NEXT SESSION:   Update HEP.  Instructed in exercises that she can perform in bed and another group of exercises that she can perform seated in the wheelchair.   Lorie Rook, PT, DPT 09/16/2023, 4:26 PM

## 2023-09-21 ENCOUNTER — Encounter: Payer: Self-pay | Admitting: Physical Therapy

## 2023-09-21 ENCOUNTER — Ambulatory Visit: Admitting: Physical Therapy

## 2023-09-21 DIAGNOSIS — Z7409 Other reduced mobility: Secondary | ICD-10-CM

## 2023-09-21 DIAGNOSIS — R2689 Other abnormalities of gait and mobility: Secondary | ICD-10-CM | POA: Diagnosis not present

## 2023-09-21 DIAGNOSIS — M6281 Muscle weakness (generalized): Secondary | ICD-10-CM | POA: Diagnosis not present

## 2023-09-21 DIAGNOSIS — R2681 Unsteadiness on feet: Secondary | ICD-10-CM | POA: Diagnosis not present

## 2023-09-21 NOTE — Therapy (Signed)
 OUTPATIENT PHYSICAL THERAPY PROSTHETIC TREATMENT  Patient Name: Susan [name] MRN: [id number] DOB:May 30, 1950, 73 y.o., female Today's Date: 09/21/2023  PCP: Austine Lefort, MD  REFERRING PROVIDER:  Gearldean Keepers, MD  END OF SESSION:  PT End of Session - 09/21/23 1632     Visit Number 39    Number of Visits 60    Date for PT Re-Evaluation 12/03/23    Authorization Type [employer]    Progress Note Due on Visit 46    PT Start Time 1519    PT Stop Time 1600    PT Time Calculation (min) 41 min    Equipment Utilized During Treatment Gait belt    Activity Tolerance Patient tolerated treatment well;Patient limited by fatigue    Behavior During Therapy WFL for tasks assessed/performed                    Past Medical History:  Diagnosis Date   Anemia    hx   Anxiety    Arthritis    "generalized" (03/15/2014)   CAD (coronary artery disease)    MI in 2000 - MI  2007 - treated bare metal stent (no nuclear since then as 9/11)   Carotid artery disease (HCC)    Chronic diastolic heart failure (HCC)    a) ECHO (08/2013) EF 55-60% and RV function nl b) RHC (08/2013) RA 4, RV 30/5/7, PA 25/10 (16), PCWP 7, Fick CO/CI 6.3/2.7, PVR 1.5 WU, PA 61 and 66%   Daily headache    "~ every other day; since I fell in June" (03/15/2014)   Depression    Diabetic retinopathy (HCC)    Dyslipidemia    ESRD (end stage renal disease) (HCC)    Dialysis on Tues Thurs Sat   Exertional shortness of breath    GERD (gastroesophageal reflux disease)    History of blood transfusion    History of kidney stones    HTN (hypertension)    Hypothyroidism    Myocardial infarction (HCC)    Obesity    Osteoarthritis    PAF (paroxysmal atrial fibrillation) (HCC)    Peripheral neuropathy    bilateral feet/hands   PONV (postoperative nausea and vomiting)    RBBB (right bundle branch block)    Old   Stroke (HCC)    mini strokes   Type II diabetes mellitus (HCC)    Type II, [name]  libre left upper arm. patient has omnipod insulin  pump with Novolin R Insulin    Past Surgical History:  Procedure Laterality Date   A/V FISTULAGRAM Left 11/07/2022   Procedure: A/V Fistulagram;  Surgeon: Carlene Che, MD;  [facility]: [facility];  Service: Cardiovascular;  Laterality: Left;   ABDOMINAL HYSTERECTOMY  1980's   AMPUTATION Right 02/24/2018   Procedure: RIGHT FOOT GREAT TOE AND 2ND TOE AMPUTATION;  Surgeon: Timothy Ford, MD;  Location: [facility] OR;  Service: Orthopedics;  Laterality: Right;   AMPUTATION Right 04/30/2018   Procedure: RIGHT TRANSMETATARSAL AMPUTATION;  Surgeon: Timothy Ford, MD;  Location: [facility] OR;  Service: Orthopedics;  Laterality: Right;   AMPUTATION Right 05/02/2022   Procedure: RIGHT BELOW KNEE AMPUTATION;  Surgeon: Timothy Ford, MD;  Location: [facility] OR;  Service: Orthopedics;  Laterality: Right;   APPLICATION OF WOUND VAC Right 06/13/2022   Procedure: APPLICATION OF WOUND VAC;  Surgeon: Timothy Ford, MD;  [facility]: [facility] OR;  Service: Orthopedics;  Laterality: Right;   APPLICATION OF WOUND VAC Left 11/14/2022  Procedure: APPLICATION OF WOUND VAC;  Surgeon: Timothy Ford, MD;  Location: [facility] OR;  Service: Orthopedics;  Laterality: Left;   AV FISTULA PLACEMENT Left 04/02/2022   Procedure: LEFT ARM ARTERIOVENOUS (AV) FISTULA CREATION;  Surgeon: Margherita Shell, MD;  [facility]: [facility] OR;  Service: Vascular;  Laterality: Left;  PERIPHERAL NERVE BLOCK   AV FISTULA PLACEMENT Right 04/27/2023   Procedure: RIGHT ARM BRACHIOBASILIC ARTERIOVENOUS (AV) FISTULA CREATION;  Surgeon: Carlene Che, MD;  Location: [facility] OR;  Service: Vascular;  Laterality: Right;   BASCILIC VEIN TRANSPOSITION Left 07/31/2022   Procedure: LEFT ARM SECOND STAGE BASILIC VEIN TRANSPOSITION;  Surgeon: Margherita Shell, MD;  [facility];  Service: Vascular;  Laterality: Left;   BIOPSY  05/27/2020   Procedure: BIOPSY;  Surgeon: Vinetta Greening, DO;  [facility]: [facility];  Service: Endoscopy;;    CATARACT EXTRACTION, BILATERAL Bilateral ?2013   COLONOSCOPY W/ POLYPECTOMY     COLONOSCOPY WITH PROPOFOL  N/A 03/13/2019   Procedure: COLONOSCOPY WITH PROPOFOL ;  Surgeon: Nannette Babe, MD;  Location: [facility] ENDOSCOPY;  Service: Gastroenterology;  Laterality: N/A;   CORONARY ANGIOPLASTY WITH STENT PLACEMENT  1999; 2007   "1 + 1"   ERCP N/A 02/03/2022   Procedure: ENDOSCOPIC RETROGRADE CHOLANGIOPANCREATOGRAPHY (ERCP);  Surgeon: Alvis Jourdain, MD;  Location: [facility] ENDOSCOPY;  Service: Gastroenterology;  Laterality: N/A;   ESOPHAGOGASTRODUODENOSCOPY N/A 02/12/2023   Procedure: ESOPHAGOGASTRODUODENOSCOPY (EGD);  Surgeon: Alvis Jourdain, MD;  Location: [facility] ENDOSCOPY;  Service: Gastroenterology;  Laterality: N/A;   ESOPHAGOGASTRODUODENOSCOPY (EGD) WITH PROPOFOL  N/A 03/13/2019   Procedure: ESOPHAGOGASTRODUODENOSCOPY (EGD) WITH PROPOFOL ;  Surgeon: Nannette Babe, MD;  Location: [facility] ENDOSCOPY;  Service: Gastroenterology;  Laterality: N/A;   ESOPHAGOGASTRODUODENOSCOPY (EGD) WITH PROPOFOL  N/A 05/27/2020   Procedure: ESOPHAGOGASTRODUODENOSCOPY (EGD) WITH PROPOFOL ;  Surgeon: Vinetta Greening, DO;  [facility];  Service: Endoscopy;  Laterality: N/A;   ESOPHAGOGASTRODUODENOSCOPY (EGD) WITH PROPOFOL  N/A 09/03/2022   Procedure: ESOPHAGOGASTRODUODENOSCOPY (EGD) WITH PROPOFOL ;  Surgeon: Alvis Jourdain, MD;  Location: [facility] ENDOSCOPY;  Service: Gastroenterology;  Laterality: N/A;   EYE SURGERY Bilateral    lazer   FLEXIBLE SIGMOIDOSCOPY N/A 05/23/2022   Procedure: FLEXIBLE SIGMOIDOSCOPY;  Surgeon: Alvis Jourdain, MD;  Location: [facility] ENDOSCOPY;  Service: Gastroenterology;  Laterality: N/A;   FLEXIBLE SIGMOIDOSCOPY N/A 05/24/2022   Procedure: FLEXIBLE SIGMOIDOSCOPY;  Surgeon: Daina Drum, MD;  Location: [facility] ENDOSCOPY;  Service: Gastroenterology;  Laterality: N/A;   FLEXIBLE SIGMOIDOSCOPY N/A 09/03/2022   Procedure: FLEXIBLE SIGMOIDOSCOPY;  Surgeon: Alvis Jourdain, MD;  Location: [facility] ENDOSCOPY;  Service: Gastroenterology;   Laterality: N/A;   HEMOSTASIS CLIP PLACEMENT  03/13/2019   Procedure: HEMOSTASIS CLIP PLACEMENT;  Surgeon: Nannette Babe, MD;  Location: [facility] ENDOSCOPY;  Service: Gastroenterology;;   HEMOSTASIS CLIP PLACEMENT  05/23/2022   Procedure: HEMOSTASIS CLIP PLACEMENT;  Surgeon: Alvis Jourdain, MD;  Location: [facility] ENDOSCOPY;  Service: Gastroenterology;;   HEMOSTASIS CONTROL  05/24/2022   Procedure: HEMOSTASIS CONTROL;  Surgeon: Daina Drum, MD;  Location: [facility] ENDOSCOPY;  Service: Gastroenterology;;   HOT HEMOSTASIS N/A 05/23/2022   Procedure: HOT HEMOSTASIS (ARGON PLASMA COAGULATION/BICAP);  Surgeon: Alvis Jourdain, MD;  Location: [facility] ENDOSCOPY;  Service: Gastroenterology;  Laterality: N/A;   I & D EXTREMITY Left 05/05/2022   Procedure: IRRIGATION AND DEBRIDEMENT LEFT ARM AV FISTULA;  Surgeon: Young Hensen, MD;  Location: [facility] OR;  Service: Vascular;  Laterality: Left;   I & D EXTREMITY N/A 11/14/2022   Procedure: IRRIGATION AND DEBRIDEMENT OF LOWER EXTREMITY WOUND;  Surgeon: Timothy Ford,  MD;  [facility] OR;  Service: Orthopedics;  Laterality: N/A;   INSERTION OF DIALYSIS CATHETER Right 04/02/2022   Procedure: INSERTION OF TUNNELED DIALYSIS CATHETER;  Surgeon: Margherita Shell, MD;  [facility];  Service: Vascular;  Laterality: Right;   IR FLUORO GUIDE CV LINE RIGHT  08/10/2023   IR VENOCAVAGRAM IVC  08/10/2023   KNEE ARTHROSCOPY Left 10/25/2006   POLYPECTOMY  03/13/2019   Procedure: POLYPECTOMY;  Surgeon: Nannette Babe, MD;  Location: [facility] ENDOSCOPY;  Service: Gastroenterology;;   REMOVAL OF STONES  02/03/2022   Procedure: REMOVAL OF STONES;  Surgeon: Alvis Jourdain, MD;  Location: [facility] ENDOSCOPY;  Service: Gastroenterology;;   REVISON OF ARTERIOVENOUS FISTULA Left 08/20/2022   Procedure: REVISON OF LEFT ARM ARTERIOVENOUS FISTULA;  Surgeon: Margherita Shell, MD;  [facility] OR;  Service: Vascular;  Laterality: Left;   RIGHT HEART CATH N/A 07/24/2017   Procedure: RIGHT HEART CATH;  Surgeon:  Mardell Shade, MD;  [facility];  Service: Cardiovascular;  Laterality: N/A;   RIGHT HEART CATHETERIZATION N/A 09/22/2013   Procedure: RIGHT HEART CATH;  Surgeon: Mardell Shade, MD;  Location: [facility] CATH LAB;  Service: Cardiovascular;  Laterality: N/A;   SHOULDER ARTHROSCOPY WITH OPEN ROTATOR CUFF REPAIR Right 03/14/2014   Procedure: RIGHT SHOULDER ARTHROSCOPY WITH BICEPS RELEASE, OPEN SUBSCAPULA REPAIR, OPEN SUPRASPINATUS REPAIR.;  Surgeon: Jasmine Mesi, MD;  Location: [facility] OR;  Service: Orthopedics;  Laterality: Right;   SPHINCTEROTOMY  02/03/2022   Procedure: SPHINCTEROTOMY;  Surgeon: Alvis Jourdain, MD;  Location: [facility] ENDOSCOPY;  Service: Gastroenterology;;   STUMP REVISION Right 06/13/2022   Procedure: REVISION RIGHT BELOW KNEE AMPUTATION;  Surgeon: Timothy Ford, MD;  Location: [facility] OR;  Service: Orthopedics;  Laterality: Right;   TEE WITHOUT CARDIOVERSION N/A 02/04/2022   Procedure: TRANSESOPHAGEAL ECHOCARDIOGRAM (TEE);  Surgeon: Mardell Shade, MD;  Location: [facility] ENDOSCOPY;  Service: Cardiovascular;  Laterality: N/A;   THROMBECTOMY W/ EMBOLECTOMY Left 08/20/2022   Procedure: THROMBECTOMY OF LEFT ARM ARTERIOVENOUS FISTULA;  Surgeon: Margherita Shell, MD;  Location: [facility] OR;  Service: Vascular;  Laterality: Left;   TOE AMPUTATION Right 02/24/2018   GREAT TOE AND 2ND TOE AMPUTATION   TUBAL LIGATION  1970's   UPPER EXTREMITY VENOGRAPHY N/A 07/31/2023   Procedure: UPPER EXTREMITY VENOGRAPHY;  Surgeon: Carlene Che, MD;  [facility];  Service: Cardiovascular;  Laterality: N/A;   Patient Active Problem List   Diagnosis Date Noted   Pyelonephritis 08/06/2023   Complicated UTI (urinary tract infection) 08/05/2023   Atrial fibrillation (HCC) 06/17/2023   Atopic dermatitis 06/17/2023   Hyperosmolar hyperglycemic state (HHS) (HCC) 02/05/2023   Gastroparesis 02/05/2023   Depression 01/29/2023   Intractable nausea and vomiting 01/20/2023   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 difficile colitis 06/05/2022   Spleen hematoma 06/05/2022   Dehiscence of amputation stump of right lower extremity (HCC) 06/05/2022   Rectal ulcer 05/27/2022   ESRD (end stage renal disease) (HCC) 05/27/2022  GI bleed 05/23/2022   Difficult intravenous access 05/23/2022   Gangrene of right foot (HCC) 05/02/2022   S/P BKA (below knee amputation) unilateral, right (HCC) 05/02/2022   Unspecified protein-calorie malnutrition (HCC) 04/15/2022   Secondary hyperparathyroidism of renal origin (HCC) 04/14/2022   Coagulation defect, unspecified (HCC) 04/09/2022   Acquired absence of other left toe(s) (HCC) 04/07/2022   Allergy, unspecified, initial encounter 04/07/2022   Dependence on renal dialysis (HCC) 04/07/2022   Gout due to renal impairment, unspecified site 04/07/2022   Hypertensive heart and chronic kidney disease with heart failure and with stage 5 chronic kidney disease, or end stage renal disease (HCC) 04/07/2022   Personal history of transient ischemic attack (TIA), and cerebral infarction without residual deficits 04/07/2022   Renal osteodystrophy 04/07/2022   Venous stasis ulcer of right calf (HCC) 03/31/2022   Fistula, colovaginal 03/26/2022   Diarrhea 03/26/2022   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   Sepsis without acute organ dysfunction (HCC)    Bacteremia    Acute pancreatitis 02/01/2022    Abdominal pain 02/01/2022   SIRS (systemic inflammatory response syndrome) (HCC) 02/01/2022   Transaminitis 02/01/2022   History of anemia due to chronic kidney disease 02/01/2022   Paroxysmal atrial fibrillation (HCC) 02/01/2022   Uncontrolled type 2 diabetes mellitus with hyperglycemia, with long-term current use of insulin  (HCC) 01/14/2022   NSTEMI (non-ST elevated myocardial infarction) (HCC) 03/05/2021   Acute renal failure superimposed on stage 4 chronic kidney disease (HCC) 08/22/2020   Hypoalbuminemia 05/25/2020   GERD (gastroesophageal reflux disease) 05/25/2020   Pressure injury of skin 05/17/2020   Acute on chronic combined systolic and diastolic congestive heart failure (HCC) 03/07/2020   Type 2 diabetes mellitus with diabetic polyneuropathy, with long-term current use of insulin  (HCC) 03/07/2020   Obesity, Class III, BMI 40-49.9 (morbid obesity) (HCC) 03/07/2020   Common bile duct (CBD) obstruction 05/28/2019   Benign neoplasm of ascending colon    Benign neoplasm of transverse colon    Benign neoplasm of descending colon    Benign neoplasm of sigmoid colon    Gastric polyps    Hyperkalemia 03/11/2019   Prolonged QT interval 03/11/2019   Acute blood loss anemia 03/11/2019   Onychomycosis 06/21/2018   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)    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 (HCC)    PVD (peripheral vascular disease) (HCC) 10/26/2017   E-coli UTI 07/27/2017   Hypothyroidism 07/27/2017   AKI (acute kidney injury) (HCC)    PAH (pulmonary artery hypertension) (HCC)    Impaired ambulation 07/19/2017   Leg cramps 02/27/2017   Peripheral edema 01/12/2017   Diabetic neuropathy (HCC) 11/12/2016   Anemia of chronic disease 10/03/2015   Historical diagnosis of generalized anxiety disorder 10/03/2015   Secondary insomnia 10/03/2015   Acute bronchitis 09/05/2015   Hyperglycemia due to diabetes mellitus (HCC) 06/07/2015   Non compliance  with medical treatment 04/17/2014   Rotator cuff tear 03/14/2014   Obesity 09/23/2013   Chronic HFrEF (heart failure with reduced ejection fraction) (HCC) 06/03/2013   Hypotension 12/25/2012   Hypokalemia 12/25/2012   Hyponatremia 12/25/2012   Urinary incontinence    MDD (major depressive disorder), recurrent episode, moderate (HCC) 11/12/2010   RBBB (right bundle branch block)    Wide-complex tachycardia    Coronary artery disease    Hyperlipemia 01/22/2009   Chronic hypotension 01/22/2009    ONSET DATE: 03/09/2023 MD referral to PT  REFERRING DIAG: S88.111D (ICD-10-CM) - Below-knee amputation of right lower extremity, subsequent  encounter   THERAPY DIAG:  Muscle weakness (generalized)  Other abnormalities of gait and mobility  Unsteadiness on feet  Impaired functional mobility, balance, gait, and endurance  Rationale for Evaluation and Treatment: Rehabilitation  SUBJECTIVE:  SUBJECTIVE STATEMENT: She has practiced walking sequence at home with her husband using the videos that PT took last time. She is limited by more back pain today upon arrival.    PERTINENT HISTORY: Right TTA 05/02/22, Lymphedema, idiopathic chronic venous HTN with ulcer, depression, colitis, diverticulitis, ESRD, gout, NSTEMI, DM2, polyneuropathy, PVD, CAD, right bundle branch block, mini strokes  PAIN:  Are you having pain?  Back pain today 8/10 upon arrival  Right shoulder today 9/10 and in last week 6/10 (medication Oxy & muscle relaxor) and 10/10 (move wrong or gets bumped)  PRECAUTIONS: Fall and Other: No BP LUE  WEIGHT BEARING RESTRICTIONS: No  FALLS: Has patient fallen in last 6 months? No  LIVING ENVIRONMENT: Lives with: lives with their spouse and 2 small dogs Lives in: mobile home Home Access: Ramped entrance Home layout: One level Stairs: Yes: External: 6-7 steps; can reach both Has following equipment at home: Single point cane, Environmental consultant - 2 wheeled, Environmental consultant - 4 wheeled, Wheelchair  (manual), [employer], Grab bars, and Ramped entry  OCCUPATION:  retired  PLOF: prior to amputation for ~year used RW in home & community  PATIENT GOALS:  to use prosthesis to walk, get out w/c in & out of car, get to bathroom.   OBJECTIVE:  COGNITION: Overall cognitive status:  Eval on 03/23/2023:   Within functional limits for tasks assessed  POSTURE:  Eval on 03/23/2023:  rounded shoulders, forward head, increased thoracic kyphosis, flexed trunk , and weight shift left  LOWER EXTREMITY ROM: ROM Right eval Left eval  Hip flexion    Hip extension    Hip abduction    Hip adduction    Hip internal rotation    Hip external rotation    Knee flexion    Knee extension    Ankle dorsiflexion    Ankle plantarflexion    Ankle inversion    Ankle eversion     (Blank rows = not tested)  LOWER EXTREMITY MMT:  MMT Right eval Left eval Right 09/09/23 Left 09/09/23  Hip flexion 3-/5 3-/5 3/5 3/5  Hip extension 2/5 2/5 3-/5 3-/5  Hip abduction 2+/5 2+/5 3-/5 3-/5  Hip adduction      Hip internal rotation      Hip external rotation      Knee flexion 3-/5 3-/5 3/5 3/5  Knee extension 3-/5 3-/5 3/5 3/5  Ankle dorsiflexion      Ankle plantarflexion      Ankle inversion      Ankle eversion      At Evaluation all strength testing is grossly seated and functionally standing / gait. (Blank rows = not tested)  TRANSFERS: 09/07/2023: Scooting transfers with minA and sit to/from stand transfers with modA.   07/27/2023: -Sit>stand from wc with RW: MinA with heavy vc for sequencing and offweighting LE -Stand>sit wc with RW: CGA with patient performing safe controlled descent  06/15/2023: -Sit to stand transfers with RW wheelchair<>mat table at varied height: 19in with maxA to rise to stand; 22in ModA rise to stand, 24in min-modA rise to stand. -stand pivot transfer with RW w/c to mat table with modA once standing.  -Lateral scooting wc<>table at level height SBA; vc for LE positioning  for optimal use of LE during transfer -lateral scoot R:  modA due to fatigue and uneven surface transfer from low surface to higher surface   05/04/2023: Squat pivot transfers with modA.  04/21/2023: Pt sit to stand w/c to //bar with one hand pushing on w/c and other hand on //bar with modA first time & MinA 2nd/ 3rd time.  Best is RUE on //bar & LUE on w/c.  Stand to sit reaching LUE to w/c with minA to control descent.   PT demo & verbal cues on sit to/from stand w/c to RW.  1st time modA 2 person. 2nd time maxA one person.  Stand to sit with minA and constant cueing. Scooting transfers with minA with w/c & mat direct contact & same ht.   Eval on 03/23/2023:  Sit to stand: Max A (two-person assist) from 22" w/c to rolling walker Stand to sit: Max A (two-person assist) rolling walker to wheelchair  FUNCTIONAL TESTs:   09/09/2023: Patient able to tolerate static stance for 5 minutes with BUE support on RW with supervision.  This was current certification goal set so that patient can stand long enough for her husband to assist with cleaning her after toileting.  07/27/2023: Patient stood with RW SBA for 54sec before c/o lightheadedness and sitting down to toilet. Resisted nudges applied posteriorly to simulate hygienic cleaning. VC for weight shifts when LE become tired.  BP: after standing 40min54sec 161/82 HR 92  06/15/2023: -Static stance for standing with RW; requiring Mod-MaxA for rise to stand d/t patient unsteady on feet posterior lean without proper righting strategy, SBA for stand   04/21/2023: Pt able to stand 60 sec with BUE on //bars with supervision.  Pt able to stand for 1 min with RW support with minA.  Eval on 03/23/2023: Patient maintained upright holding rolling walker with mod assist 2 people for safety for 30 seconds.  GAIT: 09/09/2023: Patient ambulated 30' with RW including turning 90*to position to sit with mod assistance  07/27/2023: Patient ambulated  67ft with narrow bathroom entry and 90deg turn to sit on commode CGA with minA and heavy vc for progression and sequencing of RW. While turning to sit on commode patient lost balance laterally requiring mod-maxA from therapist and patient use of grab bar to regain balance.   06/17/2023: Patient ambulated 58ft (maximal tolerated distance) with RW ModA (2 person for safety). Required VC for LE sequencing and minA to steer walker and prevent tip over.   04/21/2023: Pt amb 5' in //bars with modA (2 person for safety) with PT verbal & tactile cues on sequence and weight shift.  Pt amb 10' with RW with maxA (2 person for safety) with PT verbal & tactile cues on sequence and weight shift.  Eval on 03/23/2023:  Patient took 2 steps (one per LE) with +2 max assist with rolling walker and TTA prosthesis.  Patient adducting prosthesis stepping on left foot with no awareness.  CURRENT PROSTHETIC WEAR ASSESSMENT: 09/07/2023: Patient and husband are independent with: skin check, prosthetic cleaning, ply sock cleaning, proper wear schedule/adjustment, residual limb care, correct ply sock adjustment, and proper weight-bearing schedule/adjustment Pt reports wear most of awake hours on dialysis and non-dialysis days.  She wears prosthesis to dialysis now to assist with transfers prn and is able to verbalize proper adjustment of prosthesis weight for weigh-in & weigh-out.   06/17/2023:  Independence with prosthetic care:  Patient and husband are independent with: skin check, prosthetic cleaning, ply sock cleaning, proper wear schedule/adjustment Patient is dependent with: residual limb care,  correct ply sock adjustment, and proper weight-bearing schedule/adjustment Donning prosthesis: husband is independent with proper donning. Patient requires MaxA. Patient can verbally direct someone how to properly assist. Doffing prosthesis: Husband is independent with proper donning. Patient requires modA. Patient can verbally  direct someone how to properly assist. Prosthetic wear tolerance: majority of awake hours including dialysis hours, 1x/day, 3-4 days/week Prosthetic weight bearing tolerance: able to tolerate of standing without complains of prosthetic limb pain. Time not limited by limb discomfort but limited by muscle fatigue.   Eval on 03/23/2023:   Patient is dependent with: skin check, residual limb care, prosthetic cleaning, ply sock cleaning, correct ply sock adjustment, proper wear schedule/adjustment, and proper weight-bearing schedule/adjustment Donning prosthesis: Max A Doffing prosthesis: Min A Prosthetic wear tolerance: 2-6 hours, 1x/day, 3-4 days/week Prosthetic weight bearing tolerance: 3 minutes with limb pain Edema: pitting edema Residual limb condition: No open areas, dry skin, normal color and temperature, cylindrical shape Prosthetic description: Silicone liner with pin lock suspension, total contact socket with flexible inner socket, sach foot    TODAY'S TREATMENT:                                                                                                             DATE: 09/21/2023 Prosthetic Training with Transtibial Prosthesis: - PT demo & verbal cues on sequencing for straight path gait with RW, turning 90* to right & to left to position to sit. (advancing rolling walker after each footsteps until the back legs of RW are even with her front toes to keep walker closer and improve step through gait pattern for balance).  Verbal cues to breathe as she walks. -pt amb 10' 15\' 10" , 15" with RW with minA for straight path and min to modA to turn 90* to position to sit.   -Sit to stand X 6 with RW throughout the session with cues to lean forward more upon rising to improve performance and require less assistance with this, did improve some with practice  Therapeutic Exercise: Seated in wheelchair, lumbar flexion stretch with pball roll outs 5 sec X 10 Seated in wheelchair, lumbar  extension into yellow ball roll 5 sec X 10 Seated in wheelchair with back off of backrest for more core activation performing lumbar lateral flexion AROM X 10 each side Nu step seat #9 X 5 min UE/LE, discontinued using Rt UE do to pain  09/16/2023 Prosthetic Training with Transtibial Prosthesis: - PT demo & verbal cues on sequencing for straight path gait with RW, turning 90* to right & to left to position to sit.  Pt's husband took video on his phone.  PT recommended viewing the 3 videos to help remember the proper technique.  (advancing rolling walker after each footsteps until the back legs of RW are even with her front toes to keep walker closer and improve step through gait pattern for balance).  Patient and her husband verbalized understanding. -pt amb 10' & 47' with RW with minA for straight path and modA to turn 90* to position to sit.  Therapeutic Exercise: - PT demo lateral trunk bend to right side with patient protecting right arm due to significant pain from rotator cuff tear.  Patient performed 10 reps with no increased pain. - Patient performed 10 reps alternating hip flexion/marching.   TREATMENT:                                                                                                             DATE: 09/14/2023 Prosthetic Training with Transtibial Prosthesis: - Sit to stand 22" w/c and 18" chair with armrest to RW with modA and verbal cues. - Patient ambulated 34' x2 with RW with min assist on a straight path and mod assist for turning to position in front of the chair.  PT demo and verbal cues on sequencing (advancing rolling walker after each footsteps until the back legs of RW are even with her front toes to keep walker closer and improve step through gait pattern for balance). PT demo & verbal cues on sequencing for turning.    Therapeutic Activities: - PT had long discussion/education session on establishing a bowel program including transfers to [facility] and ADLs for  cleaning including standing.  Patient and husband verbalized understanding with plans to initiate currently on the mornings that she does not have dialysis.    TREATMENT:                                                                                                             DATE: 09/09/2023 Prosthetic Training with Transtibial Prosthesis: - See objective data for standing and gait.  Therapeutic Activities: -PT educated verbally and with demo on positioning right upper extremity in sitting and right side-lying.  Patient and husband verbalized understanding. - Patient performed trunk balance activities seated at edge of wheelchair without back support or arm support.  Back extensor strength leaning forward to upright, abdominal strength leaning back against the back of the wheelchair then sitting up, trunk rotation with L UE cradling RUE at elbow and side bend.  PT explained that all motions were controlled that will assist with getting her right shoulder to move within acceptable ranges which can decrease the chances of it fully freezing.  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    PATIENT EDUCATION: PATIENT EDUCATED ON FOLLOWING PROSTHETIC CARE: Education details: Use of stockinette under proximal liner to decrease itching sensation, no wounds presents to PT recommended not using Vive wear under liner Skin check, Prosthetic  cleaning, Propper donning, and Proper wear schedule/adjustment Prosthetic wear tolerance: 3 hours 2x/day, 4 days/week Person educated: Patient and Spouse Education method: Explanation, Tactile cues, and Verbal cues Education comprehension: verbalized understanding, verbal cues required, tactile cues required, and needs further education  HOME EXERCISE PROGRAM: Access Code: [email or web address] URL: [email or web address] Date:  08/19/2023 Prepared by: Lorie Rook   Exercises - Supine Bridge  - 1 x daily - 4 x weekly - 2 sets - 5 reps - 2 seconds hold - Supine Lower Trunk Rotation  - 1 x daily - 4 x weekly - 2 sets - 5 reps - 5 seconds hold - Supine Heel Slide with Strap  - 1 x daily - 4 x weekly - 2 sets - 5 reps - 2-3 seconds hold - Supine Hip Abduction  - 1 x daily - 4 x weekly - 2 sets - 5 reps - 2-3 seconds hold - Seated Hip Flexion Toward Target  - 1 x daily - 4 x weekly - 2 sets - 5 reps - 5 seconds hold - Seated Eccentric Abdominal Lean Back  - 1 x daily - 4 x weekly - 2 sets - 5 reps - 5 seconds hold - Seated Sidebending  - 1 x daily - 4 x weekly - 2 sets - 5 reps - 5 seconds hold - Seated Trunk Rotation with Crossed Arms  - 1 x daily - 4 x weekly - 2 sets - 5 reps - 5 seconds hold - Seated March  - 1 x daily - 4 x weekly - 2 sets - 10 reps - 5 seconds hold - Seated Knee Flexion Extension AROM   - 1 x daily - 4 x weekly - 2 sets - 10 reps - 5 seconds hold    ASSESSMENT:  CLINICAL IMPRESSION: Sequencing was better for gait but she was forgetting to breathe and instead holding her breath during ambulation so provided education to take a breath after every 2 steps and how this can help her ambulate further. Reviewed Sit to stand technique to lean forward more to improve performance and require less assistance with this, did improve some with practice. She was limited by back pain today but does report this feels some better after session doing some exercises for this as well.  OBJECTIVE IMPAIRMENTS: Abnormal gait, decreased activity tolerance, decreased balance, decreased endurance, decreased knowledge of condition, decreased knowledge of use of DME, decreased mobility, difficulty walking, decreased ROM, decreased strength, decreased safety awareness, postural dysfunction, prosthetic dependency , and pain.   ACTIVITY LIMITATIONS: standing, transfers, and locomotion level  PARTICIPATION LIMITATIONS: community  activity, household mobility and dependency / burden of care on family  PERSONAL FACTORS: Age, Fitness, Past/current experiences, Time since onset of injury/illness/exacerbation, and 3+ comorbidities: see PMH  are also affecting patient's functional outcome.   REHAB POTENTIAL: Good  CLINICAL DECISION MAKING: Evolving/moderate complexity  EVALUATION COMPLEXITY: Moderate   GOALS: Goals reviewed with patient? Yes  SHORT TERM GOALS: Target date: 10/07/2023:  Patient & husband verbalized patient able to perform scooting transfer to / from [facility] and sit to/from stand [facility] to RW with husband's assistance safely. Baseline: SEE OBJECTIVE DATA Goal status:   Ongoing  09/16/2023 2.  Patient able to sit to stand w/c to rolling walker with min assistance. Baseline: SEE OBJECTIVE DATA Goal status: Ongoing  09/16/2023  3. Patient ambulates 65ft including turning 90* to position to sit with RW & prosthesis with modA.  Baseline: Ongoing  09/16/2023  UPDATED LONG TERM GOALS: Target  date: 12/03/2023  Patient & husband continues to demonstrate & verbalize understanding of prosthetic care and wears prosthesis >80% of awake hours daily to enable safe utilization of prosthesis. Baseline: SEE OBJECTIVE DATA Goal status: Ongoing  09/16/2023  Patient and husband report patient is able to stand to enable cleaning after toileting safely with no issues Baseline: SEE OBJECTIVE DATA Goal status: Ongoing  09/16/2023  Patient able to perform sit to/from stand transfers with minA.  Baseline: SEE OBJECTIVE DATA Goal status: Ongoing  09/16/2023  Patient ambulates >26ft including turning 90* to position to sit with prosthesis & RW with minA with husband's assistance safely. Baseline: SEE OBJECTIVE DATA Goal status: Ongoing  09/16/2023  Patient understanding of ongoing HEP. Baseline: SEE OBJECTIVE DATA Goal status:  Ongoing  09/16/2023  PLAN:  PT FREQUENCY: 2x/week  PT DURATION: 12 weeks  PLANNED INTERVENTIONS:  97164- PT Re-evaluation, 97110-Therapeutic exercises, 97530- Therapeutic activity, 97112- Neuromuscular re-education, [phone]- Self Care, 62130- Gait training, [phone]- Prosthetic training, Patient/Family education, Balance training, DME instructions, Therapeutic exercises, Therapeutic activity, Neuromuscular re-education, Gait training, and Self Care  PLAN FOR NEXT SESSION:   Update HEP.  Instructed in exercises that she can perform in bed and another group of exercises that she can perform seated in the wheelchair.   Mick Alamin, PT, DPT 09/21/2023, 4:33 PM

## 2023-09-22 DIAGNOSIS — Z992 Dependence on renal dialysis: Secondary | ICD-10-CM | POA: Diagnosis not present

## 2023-09-22 DIAGNOSIS — D509 Iron deficiency anemia, unspecified: Secondary | ICD-10-CM | POA: Diagnosis not present

## 2023-09-22 DIAGNOSIS — N186 End stage renal disease: Secondary | ICD-10-CM | POA: Diagnosis not present

## 2023-09-22 DIAGNOSIS — N2581 Secondary hyperparathyroidism of renal origin: Secondary | ICD-10-CM | POA: Diagnosis not present

## 2023-09-22 DIAGNOSIS — D689 Coagulation defect, unspecified: Secondary | ICD-10-CM | POA: Diagnosis not present

## 2023-09-23 ENCOUNTER — Ambulatory Visit: Admitting: Physical Therapy

## 2023-09-23 ENCOUNTER — Encounter: Payer: Self-pay | Admitting: Physical Therapy

## 2023-09-23 DIAGNOSIS — N179 Acute kidney failure, unspecified: Secondary | ICD-10-CM | POA: Diagnosis not present

## 2023-09-23 DIAGNOSIS — Z7409 Other reduced mobility: Secondary | ICD-10-CM

## 2023-09-23 DIAGNOSIS — R2681 Unsteadiness on feet: Secondary | ICD-10-CM

## 2023-09-23 DIAGNOSIS — K626 Ulcer of anus and rectum: Secondary | ICD-10-CM | POA: Diagnosis not present

## 2023-09-23 DIAGNOSIS — I5043 Acute on chronic combined systolic (congestive) and diastolic (congestive) heart failure: Secondary | ICD-10-CM | POA: Diagnosis not present

## 2023-09-23 DIAGNOSIS — R2689 Other abnormalities of gait and mobility: Secondary | ICD-10-CM | POA: Diagnosis not present

## 2023-09-23 DIAGNOSIS — M6281 Muscle weakness (generalized): Secondary | ICD-10-CM

## 2023-09-23 DIAGNOSIS — Z992 Dependence on renal dialysis: Secondary | ICD-10-CM | POA: Diagnosis not present

## 2023-09-23 DIAGNOSIS — G546 Phantom limb syndrome with pain: Secondary | ICD-10-CM | POA: Diagnosis not present

## 2023-09-23 DIAGNOSIS — N186 End stage renal disease: Secondary | ICD-10-CM | POA: Diagnosis not present

## 2023-09-23 DIAGNOSIS — J9611 Chronic respiratory failure with hypoxia: Secondary | ICD-10-CM | POA: Diagnosis not present

## 2023-09-23 DIAGNOSIS — I5042 Chronic combined systolic (congestive) and diastolic (congestive) heart failure: Secondary | ICD-10-CM | POA: Diagnosis not present

## 2023-09-23 DIAGNOSIS — K219 Gastro-esophageal reflux disease without esophagitis: Secondary | ICD-10-CM | POA: Diagnosis not present

## 2023-09-23 NOTE — Therapy (Signed)
 OUTPATIENT PHYSICAL THERAPY PROSTHETIC TREATMENT  Patient Name: Susan [name] MRN: [id number] DOB:1951-04-05, 73 y.o., female Today's Date: 09/23/2023  PCP: Austine Lefort, MD  REFERRING PROVIDER:  Gearldean Keepers, MD  END OF SESSION:  PT End of Session - 09/23/23 1347     Visit Number 40    Number of Visits 60    Date for PT Re-Evaluation 12/03/23    Authorization Type [employer]    Progress Note Due on Visit 46    PT Start Time 1345    PT Stop Time 1425    PT Time Calculation (min) 40 min    Equipment Utilized During Treatment Gait belt    Activity Tolerance Patient tolerated treatment well;Patient limited by fatigue    Behavior During Therapy WFL for tasks assessed/performed                     Past Medical History:  Diagnosis Date   Anemia    hx   Anxiety    Arthritis    "generalized" (03/15/2014)   CAD (coronary artery disease)    MI in 2000 - MI  2007 - treated bare metal stent (no nuclear since then as 9/11)   Carotid artery disease (HCC)    Chronic diastolic heart failure (HCC)    a) ECHO (08/2013) EF 55-60% and RV function nl b) RHC (08/2013) RA 4, RV 30/5/7, PA 25/10 (16), PCWP 7, Fick CO/CI 6.3/2.7, PVR 1.5 WU, PA 61 and 66%   Daily headache    "~ every other day; since I fell in June" (03/15/2014)   Depression    Diabetic retinopathy (HCC)    Dyslipidemia    ESRD (end stage renal disease) (HCC)    Dialysis on Tues Thurs Sat   Exertional shortness of breath    GERD (gastroesophageal reflux disease)    History of blood transfusion    History of kidney stones    HTN (hypertension)    Hypothyroidism    Myocardial infarction (HCC)    Obesity    Osteoarthritis    PAF (paroxysmal atrial fibrillation) (HCC)    Peripheral neuropathy    bilateral feet/hands   PONV (postoperative nausea and vomiting)    RBBB (right bundle branch block)    Old   Stroke (HCC)    mini strokes   Type II diabetes mellitus (HCC)    Type II, [name]  libre left upper arm. patient has omnipod insulin  pump with Novolin R Insulin    Past Surgical History:  Procedure Laterality Date   A/V FISTULAGRAM Left 11/07/2022   Procedure: A/V Fistulagram;  Surgeon: Carlene Che, MD;  [facility]: [facility];  Service: Cardiovascular;  Laterality: Left;   ABDOMINAL HYSTERECTOMY  1980's   AMPUTATION Right 02/24/2018   Procedure: RIGHT FOOT GREAT TOE AND 2ND TOE AMPUTATION;  Surgeon: Timothy Ford, MD;  Location: [facility] OR;  Service: Orthopedics;  Laterality: Right;   AMPUTATION Right 04/30/2018   Procedure: RIGHT TRANSMETATARSAL AMPUTATION;  Surgeon: Timothy Ford, MD;  Location: [facility] OR;  Service: Orthopedics;  Laterality: Right;   AMPUTATION Right 05/02/2022   Procedure: RIGHT BELOW KNEE AMPUTATION;  Surgeon: Timothy Ford, MD;  Location: [facility] OR;  Service: Orthopedics;  Laterality: Right;   APPLICATION OF WOUND VAC Right 06/13/2022   Procedure: APPLICATION OF WOUND VAC;  Surgeon: Timothy Ford, MD;  Location: [facility] OR;  Service: Orthopedics;  Laterality: Right;   APPLICATION OF WOUND VAC Left 11/14/2022  Procedure: APPLICATION OF WOUND VAC;  Surgeon: Timothy Ford, MD;  Location: [facility] OR;  Service: Orthopedics;  Laterality: Left;   AV FISTULA PLACEMENT Left 04/02/2022   Procedure: LEFT ARM ARTERIOVENOUS (AV) FISTULA CREATION;  Surgeon: Margherita Shell, MD;  Location: [facility] OR;  Service: Vascular;  Laterality: Left;  PERIPHERAL NERVE BLOCK   AV FISTULA PLACEMENT Right 04/27/2023   Procedure: RIGHT ARM BRACHIOBASILIC ARTERIOVENOUS (AV) FISTULA CREATION;  Surgeon: Carlene Che, MD;  Location: [facility] OR;  Service: Vascular;  Laterality: Right;   BASCILIC VEIN TRANSPOSITION Left 07/31/2022   Procedure: LEFT ARM SECOND STAGE BASILIC VEIN TRANSPOSITION;  Surgeon: Margherita Shell, MD;  [facility]: [facility] OR;  Service: Vascular;  Laterality: Left;   BIOPSY  05/27/2020   Procedure: BIOPSY;  Surgeon: Vinetta Greening, DO;  [facility]: [facility];  Service: Endoscopy;;    CATARACT EXTRACTION, BILATERAL Bilateral ?2013   COLONOSCOPY W/ POLYPECTOMY     COLONOSCOPY WITH PROPOFOL  N/A 03/13/2019   Procedure: COLONOSCOPY WITH PROPOFOL ;  Surgeon: Nannette Babe, MD;  Location: [facility] ENDOSCOPY;  Service: Gastroenterology;  Laterality: N/A;   CORONARY ANGIOPLASTY WITH STENT PLACEMENT  1999; 2007   "1 + 1"   ERCP N/A 02/03/2022   Procedure: ENDOSCOPIC RETROGRADE CHOLANGIOPANCREATOGRAPHY (ERCP);  Surgeon: Alvis Jourdain, MD;  Location: [facility] ENDOSCOPY;  Service: Gastroenterology;  Laterality: N/A;   ESOPHAGOGASTRODUODENOSCOPY N/A 02/12/2023   Procedure: ESOPHAGOGASTRODUODENOSCOPY (EGD);  Surgeon: Alvis Jourdain, MD;  Location: [facility] ENDOSCOPY;  Service: Gastroenterology;  Laterality: N/A;   ESOPHAGOGASTRODUODENOSCOPY (EGD) WITH PROPOFOL  N/A 03/13/2019   Procedure: ESOPHAGOGASTRODUODENOSCOPY (EGD) WITH PROPOFOL ;  Surgeon: Nannette Babe, MD;  Location: [facility] ENDOSCOPY;  Service: Gastroenterology;  Laterality: N/A;   ESOPHAGOGASTRODUODENOSCOPY (EGD) WITH PROPOFOL  N/A 05/27/2020   Procedure: ESOPHAGOGASTRODUODENOSCOPY (EGD) WITH PROPOFOL ;  Surgeon: Vinetta Greening, DO;  [facility];  Service: Endoscopy;  Laterality: N/A;   ESOPHAGOGASTRODUODENOSCOPY (EGD) WITH PROPOFOL  N/A 09/03/2022   Procedure: ESOPHAGOGASTRODUODENOSCOPY (EGD) WITH PROPOFOL ;  Surgeon: Alvis Jourdain, MD;  Location: [facility] ENDOSCOPY;  Service: Gastroenterology;  Laterality: N/A;   EYE SURGERY Bilateral    lazer   FLEXIBLE SIGMOIDOSCOPY N/A 05/23/2022   Procedure: FLEXIBLE SIGMOIDOSCOPY;  Surgeon: Alvis Jourdain, MD;  Location: [facility] ENDOSCOPY;  Service: Gastroenterology;  Laterality: N/A;   FLEXIBLE SIGMOIDOSCOPY N/A 05/24/2022   Procedure: FLEXIBLE SIGMOIDOSCOPY;  Surgeon: Daina Drum, MD;  Location: [facility] ENDOSCOPY;  Service: Gastroenterology;  Laterality: N/A;   FLEXIBLE SIGMOIDOSCOPY N/A 09/03/2022   Procedure: FLEXIBLE SIGMOIDOSCOPY;  Surgeon: Alvis Jourdain, MD;  Location: [facility] ENDOSCOPY;  Service: Gastroenterology;   Laterality: N/A;   HEMOSTASIS CLIP PLACEMENT  03/13/2019   Procedure: HEMOSTASIS CLIP PLACEMENT;  Surgeon: Nannette Babe, MD;  Location: [facility] ENDOSCOPY;  Service: Gastroenterology;;   HEMOSTASIS CLIP PLACEMENT  05/23/2022   Procedure: HEMOSTASIS CLIP PLACEMENT;  Surgeon: Alvis Jourdain, MD;  Location: [facility] ENDOSCOPY;  Service: Gastroenterology;;   HEMOSTASIS CONTROL  05/24/2022   Procedure: HEMOSTASIS CONTROL;  Surgeon: Daina Drum, MD;  Location: [facility] ENDOSCOPY;  Service: Gastroenterology;;   HOT HEMOSTASIS N/A 05/23/2022   Procedure: HOT HEMOSTASIS (ARGON PLASMA COAGULATION/BICAP);  Surgeon: Alvis Jourdain, MD;  Location: [facility] ENDOSCOPY;  Service: Gastroenterology;  Laterality: N/A;   I & D EXTREMITY Left 05/05/2022   Procedure: IRRIGATION AND DEBRIDEMENT LEFT ARM AV FISTULA;  Surgeon: Young Hensen, MD;  Location: [facility] OR;  Service: Vascular;  Laterality: Left;   I & D EXTREMITY N/A 11/14/2022   Procedure: IRRIGATION AND DEBRIDEMENT OF LOWER EXTREMITY WOUND;  Surgeon: Timothy Ford,  MD;  [facility] OR;  Service: Orthopedics;  Laterality: N/A;   INSERTION OF DIALYSIS CATHETER Right 04/02/2022   Procedure: INSERTION OF TUNNELED DIALYSIS CATHETER;  Surgeon: Margherita Shell, MD;  [facility]: [facility] OR;  Service: Vascular;  Laterality: Right;   IR FLUORO GUIDE CV LINE RIGHT  08/10/2023   IR VENOCAVAGRAM IVC  08/10/2023   KNEE ARTHROSCOPY Left 10/25/2006   POLYPECTOMY  03/13/2019   Procedure: POLYPECTOMY;  Surgeon: Nannette Babe, MD;  Location: [facility] ENDOSCOPY;  Service: Gastroenterology;;   REMOVAL OF STONES  02/03/2022   Procedure: REMOVAL OF STONES;  Surgeon: Alvis Jourdain, MD;  Location: [facility] ENDOSCOPY;  Service: Gastroenterology;;   REVISON OF ARTERIOVENOUS FISTULA Left 08/20/2022   Procedure: REVISON OF LEFT ARM ARTERIOVENOUS FISTULA;  Surgeon: Margherita Shell, MD;  [facility] OR;  Service: Vascular;  Laterality: Left;   RIGHT HEART CATH N/A 07/24/2017   Procedure: RIGHT HEART CATH;  Surgeon:  Mardell Shade, MD;  [facility];  Service: Cardiovascular;  Laterality: N/A;   RIGHT HEART CATHETERIZATION N/A 09/22/2013   Procedure: RIGHT HEART CATH;  Surgeon: Mardell Shade, MD;  Location: [facility] CATH LAB;  Service: Cardiovascular;  Laterality: N/A;   SHOULDER ARTHROSCOPY WITH OPEN ROTATOR CUFF REPAIR Right 03/14/2014   Procedure: RIGHT SHOULDER ARTHROSCOPY WITH BICEPS RELEASE, OPEN SUBSCAPULA REPAIR, OPEN SUPRASPINATUS REPAIR.;  Surgeon: Jasmine Mesi, MD;  Location: [facility] OR;  Service: Orthopedics;  Laterality: Right;   SPHINCTEROTOMY  02/03/2022   Procedure: SPHINCTEROTOMY;  Surgeon: Alvis Jourdain, MD;  Location: [facility] ENDOSCOPY;  Service: Gastroenterology;;   STUMP REVISION Right 06/13/2022   Procedure: REVISION RIGHT BELOW KNEE AMPUTATION;  Surgeon: Timothy Ford, MD;  Location: [facility] OR;  Service: Orthopedics;  Laterality: Right;   TEE WITHOUT CARDIOVERSION N/A 02/04/2022   Procedure: TRANSESOPHAGEAL ECHOCARDIOGRAM (TEE);  Surgeon: Mardell Shade, MD;  Location: [facility] ENDOSCOPY;  Service: Cardiovascular;  Laterality: N/A;   THROMBECTOMY W/ EMBOLECTOMY Left 08/20/2022   Procedure: THROMBECTOMY OF LEFT ARM ARTERIOVENOUS FISTULA;  Surgeon: Margherita Shell, MD;  Location: [facility] OR;  Service: Vascular;  Laterality: Left;   TOE AMPUTATION Right 02/24/2018   GREAT TOE AND 2ND TOE AMPUTATION   TUBAL LIGATION  1970's   UPPER EXTREMITY VENOGRAPHY N/A 07/31/2023   Procedure: UPPER EXTREMITY VENOGRAPHY;  Surgeon: Carlene Che, MD;  [facility];  Service: Cardiovascular;  Laterality: N/A;   Patient Active Problem List   Diagnosis Date Noted   Pyelonephritis 08/06/2023   Complicated UTI (urinary tract infection) 08/05/2023   Atrial fibrillation (HCC) 06/17/2023   Atopic dermatitis 06/17/2023   Hyperosmolar hyperglycemic state (HHS) (HCC) 02/05/2023   Gastroparesis 02/05/2023   Depression 01/29/2023   Intractable nausea and vomiting 01/20/2023   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 difficile colitis 06/05/2022   Spleen hematoma 06/05/2022   Dehiscence of amputation stump of right lower extremity (HCC) 06/05/2022   Rectal ulcer 05/27/2022   ESRD (end stage renal disease) (HCC) 05/27/2022  GI bleed 05/23/2022   Difficult intravenous access 05/23/2022   Gangrene of right foot (HCC) 05/02/2022   S/P BKA (below knee amputation) unilateral, right (HCC) 05/02/2022   Unspecified protein-calorie malnutrition (HCC) 04/15/2022   Secondary hyperparathyroidism of renal origin (HCC) 04/14/2022   Coagulation defect, unspecified (HCC) 04/09/2022   Acquired absence of other left toe(s) (HCC) 04/07/2022   Allergy, unspecified, initial encounter 04/07/2022   Dependence on renal dialysis (HCC) 04/07/2022   Gout due to renal impairment, unspecified site 04/07/2022   Hypertensive heart and chronic kidney disease with heart failure and with stage 5 chronic kidney disease, or end stage renal disease (HCC) 04/07/2022   Personal history of transient ischemic attack (TIA), and cerebral infarction without residual deficits 04/07/2022   Renal osteodystrophy 04/07/2022   Venous stasis ulcer of right calf (HCC) 03/31/2022   Fistula, colovaginal 03/26/2022   Diarrhea 03/26/2022   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   Sepsis without acute organ dysfunction (HCC)    Bacteremia    Acute pancreatitis 02/01/2022    Abdominal pain 02/01/2022   SIRS (systemic inflammatory response syndrome) (HCC) 02/01/2022   Transaminitis 02/01/2022   History of anemia due to chronic kidney disease 02/01/2022   Paroxysmal atrial fibrillation (HCC) 02/01/2022   Uncontrolled type 2 diabetes mellitus with hyperglycemia, with long-term current use of insulin  (HCC) 01/14/2022   NSTEMI (non-ST elevated myocardial infarction) (HCC) 03/05/2021   Acute renal failure superimposed on stage 4 chronic kidney disease (HCC) 08/22/2020   Hypoalbuminemia 05/25/2020   GERD (gastroesophageal reflux disease) 05/25/2020   Pressure injury of skin 05/17/2020   Acute on chronic combined systolic and diastolic congestive heart failure (HCC) 03/07/2020   Type 2 diabetes mellitus with diabetic polyneuropathy, with long-term current use of insulin  (HCC) 03/07/2020   Obesity, Class III, BMI 40-49.9 (morbid obesity) (HCC) 03/07/2020   Common bile duct (CBD) obstruction 05/28/2019   Benign neoplasm of ascending colon    Benign neoplasm of transverse colon    Benign neoplasm of descending colon    Benign neoplasm of sigmoid colon    Gastric polyps    Hyperkalemia 03/11/2019   Prolonged QT interval 03/11/2019   Acute blood loss anemia 03/11/2019   Onychomycosis 06/21/2018   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)    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 (HCC)    PVD (peripheral vascular disease) (HCC) 10/26/2017   E-coli UTI 07/27/2017   Hypothyroidism 07/27/2017   AKI (acute kidney injury) (HCC)    PAH (pulmonary artery hypertension) (HCC)    Impaired ambulation 07/19/2017   Leg cramps 02/27/2017   Peripheral edema 01/12/2017   Diabetic neuropathy (HCC) 11/12/2016   Anemia of chronic disease 10/03/2015   Historical diagnosis of generalized anxiety disorder 10/03/2015   Secondary insomnia 10/03/2015   Acute bronchitis 09/05/2015   Hyperglycemia due to diabetes mellitus (HCC) 06/07/2015   Non compliance  with medical treatment 04/17/2014   Rotator cuff tear 03/14/2014   Obesity 09/23/2013   Chronic HFrEF (heart failure with reduced ejection fraction) (HCC) 06/03/2013   Hypotension 12/25/2012   Hypokalemia 12/25/2012   Hyponatremia 12/25/2012   Urinary incontinence    MDD (major depressive disorder), recurrent episode, moderate (HCC) 11/12/2010   RBBB (right bundle branch block)    Wide-complex tachycardia    Coronary artery disease    Hyperlipemia 01/22/2009   Chronic hypotension 01/22/2009    ONSET DATE: 03/09/2023 MD referral to PT  REFERRING DIAG: S88.111D (ICD-10-CM) - Below-knee amputation of right lower extremity, subsequent  encounter   THERAPY DIAG:  Muscle weakness (generalized)  Other abnormalities of gait and mobility  Unsteadiness on feet  Impaired functional mobility, balance, gait, and endurance  Phantom limb syndrome with pain (HCC)  Rationale for Evaluation and Treatment: Rehabilitation  SUBJECTIVE:  SUBJECTIVE STATEMENT: She saw Alexander Iba, CPO today who thinks pads are currently working.    PERTINENT HISTORY: Right TTA 05/02/22, Lymphedema, idiopathic chronic venous HTN with ulcer, depression, colitis, diverticulitis, ESRD, gout, NSTEMI, DM2, polyneuropathy, PVD, CAD, right bundle branch block, mini strokes  PAIN:  Are you having pain?  Back pain today   7/10 upon arrival  Right shoulder today at rest 4-5/10 with moving 8-9/10;  pillow for support 5/10 (getting better sleep).   PRECAUTIONS: Fall and Other: No BP LUE  WEIGHT BEARING RESTRICTIONS: No  FALLS: Has patient fallen in last 6 months? No  LIVING ENVIRONMENT: Lives with: lives with their spouse and 2 small dogs Lives in: mobile home Home Access: Ramped entrance Home layout: One level Stairs: Yes: External: 6-7 steps; can reach both Has following equipment at home: Single point cane, Environmental consultant - 2 wheeled, Environmental consultant - 4 wheeled, Wheelchair (manual), [employer], Grab bars, and Ramped  entry  OCCUPATION:  retired  PLOF: prior to amputation for ~year used RW in home & community  PATIENT GOALS:  to use prosthesis to walk, get out w/c in & out of car, get to bathroom.   OBJECTIVE:  COGNITION: Overall cognitive status:  Eval on 03/23/2023:   Within functional limits for tasks assessed  POSTURE:  Eval on 03/23/2023:  rounded shoulders, forward head, increased thoracic kyphosis, flexed trunk , and weight shift left  LOWER EXTREMITY ROM: ROM Right eval Left eval  Hip flexion    Hip extension    Hip abduction    Hip adduction    Hip internal rotation    Hip external rotation    Knee flexion    Knee extension    Ankle dorsiflexion    Ankle plantarflexion    Ankle inversion    Ankle eversion     (Blank rows = not tested)  LOWER EXTREMITY MMT:  MMT Right eval Left eval Right 09/09/23 Left 09/09/23  Hip flexion 3-/5 3-/5 3/5 3/5  Hip extension 2/5 2/5 3-/5 3-/5  Hip abduction 2+/5 2+/5 3-/5 3-/5  Hip adduction      Hip internal rotation      Hip external rotation      Knee flexion 3-/5 3-/5 3/5 3/5  Knee extension 3-/5 3-/5 3/5 3/5  Ankle dorsiflexion      Ankle plantarflexion      Ankle inversion      Ankle eversion      At Evaluation all strength testing is grossly seated and functionally standing / gait. (Blank rows = not tested)  TRANSFERS: 09/07/2023: Scooting transfers with minA and sit to/from stand transfers with modA.   07/27/2023: -Sit>stand from wc with RW: MinA with heavy vc for sequencing and offweighting LE -Stand>sit wc with RW: CGA with patient performing safe controlled descent  06/15/2023: -Sit to stand transfers with RW wheelchair<>mat table at varied height: 19in with maxA to rise to stand; 22in ModA rise to stand, 24in min-modA rise to stand. -stand pivot transfer with RW w/c to mat table with modA once standing.  -Lateral scooting wc<>table at level height SBA; vc for LE positioning for optimal use of LE during transfer -lateral  scoot R: modA due to fatigue and uneven surface transfer from low  surface to higher surface   05/04/2023: Squat pivot transfers with modA.  04/21/2023: Pt sit to stand w/c to //bar with one hand pushing on w/c and other hand on //bar with modA first time & MinA 2nd/ 3rd time.  Best is RUE on //bar & LUE on w/c.  Stand to sit reaching LUE to w/c with minA to control descent.   PT demo & verbal cues on sit to/from stand w/c to RW.  1st time modA 2 person. 2nd time maxA one person.  Stand to sit with minA and constant cueing. Scooting transfers with minA with w/c & mat direct contact & same ht.   Eval on 03/23/2023:  Sit to stand: Max A (two-person assist) from 22" w/c to rolling walker Stand to sit: Max A (two-person assist) rolling walker to wheelchair  FUNCTIONAL TESTs:   09/09/2023: Patient able to tolerate static stance for 5 minutes with BUE support on RW with supervision.  This was current certification goal set so that patient can stand long enough for her husband to assist with cleaning her after toileting.  07/27/2023: Patient stood with RW SBA for 54sec before c/o lightheadedness and sitting down to toilet. Resisted nudges applied posteriorly to simulate hygienic cleaning. VC for weight shifts when LE become tired.  BP: after standing 48min54sec 161/82 HR 92  06/15/2023: -Static stance for standing with RW; requiring Mod-MaxA for rise to stand d/t patient unsteady on feet posterior lean without proper righting strategy, SBA for stand   04/21/2023: Pt able to stand 60 sec with BUE on //bars with supervision.  Pt able to stand for 1 min with RW support with minA.  Eval on 03/23/2023: Patient maintained upright holding rolling walker with mod assist 2 people for safety for 30 seconds.  GAIT: 09/09/2023: Patient ambulated 30' with RW including turning 90*to position to sit with mod assistance  07/27/2023: Patient ambulated 59ft with narrow bathroom entry and 90deg turn to  sit on commode CGA with minA and heavy vc for progression and sequencing of RW. While turning to sit on commode patient lost balance laterally requiring mod-maxA from therapist and patient use of grab bar to regain balance.   06/17/2023: Patient ambulated 61ft (maximal tolerated distance) with RW ModA (2 person for safety). Required VC for LE sequencing and minA to steer walker and prevent tip over.   04/21/2023: Pt amb 5' in //bars with modA (2 person for safety) with PT verbal & tactile cues on sequence and weight shift.  Pt amb 10' with RW with maxA (2 person for safety) with PT verbal & tactile cues on sequence and weight shift.  Eval on 03/23/2023:  Patient took 2 steps (one per LE) with +2 max assist with rolling walker and TTA prosthesis.  Patient adducting prosthesis stepping on left foot with no awareness.  CURRENT PROSTHETIC WEAR ASSESSMENT: 09/07/2023: Patient and husband are independent with: skin check, prosthetic cleaning, ply sock cleaning, proper wear schedule/adjustment, residual limb care, correct ply sock adjustment, and proper weight-bearing schedule/adjustment Pt reports wear most of awake hours on dialysis and non-dialysis days.  She wears prosthesis to dialysis now to assist with transfers prn and is able to verbalize proper adjustment of prosthesis weight for weigh-in & weigh-out.   06/17/2023:  Independence with prosthetic care:  Patient and husband are independent with: skin check, prosthetic cleaning, ply sock cleaning, proper wear schedule/adjustment Patient is dependent with: residual limb care, correct ply sock adjustment, and proper weight-bearing schedule/adjustment Donning prosthesis:  husband is independent with proper donning. Patient requires MaxA. Patient can verbally direct someone how to properly assist. Doffing prosthesis: Husband is independent with proper donning. Patient requires modA. Patient can verbally direct someone how to properly assist. Prosthetic  wear tolerance: majority of awake hours including dialysis hours, 1x/day, 3-4 days/week Prosthetic weight bearing tolerance: able to tolerate of standing without complains of prosthetic limb pain. Time not limited by limb discomfort but limited by muscle fatigue.   Eval on 03/23/2023:   Patient is dependent with: skin check, residual limb care, prosthetic cleaning, ply sock cleaning, correct ply sock adjustment, proper wear schedule/adjustment, and proper weight-bearing schedule/adjustment Donning prosthesis: Max A Doffing prosthesis: Min A Prosthetic wear tolerance: 2-6 hours, 1x/day, 3-4 days/week Prosthetic weight bearing tolerance: 3 minutes with limb pain Edema: pitting edema Residual limb condition: No open areas, dry skin, normal color and temperature, cylindrical shape Prosthetic description: Silicone liner with pin lock suspension, total contact socket with flexible inner socket, sach foot    TODAY'S TREATMENT:                                                                                                             DATE: 09/23/2023 Prosthetic Training with Transtibial Prosthesis: - Pt sit to stand wheelchair to RW with minA.  Patient able to stand upright for 3 minutes with RW support with supervision.  Patient had controlled stand to sit without assistance.  Therapeutic exercise: - PT reprinted patient HEP HO.  PT reviewed HEP handout with 2 groups of exercises.  First group is for exercises to be performed while patient is in the bed: Bridge, lower body trunk rotation, heel slides with strap assist and hip Abd/adduction.  Second group is seated exercises with patient sitting in wheelchair feet on the floor position at the edge of the wheelchair: Forward trunk lean to upright for back extension, leaning back with set up for abdominals, lateral trunk lean including how to protect right shoulder, trunk rotation, marching and stepping lower extremity out/back.  Patient performed 5  reps of all exercises with understanding PT is recommending 2 sets of 5 reps per HEP.  PT strongly discussed need to perform HEP daily on all days that she is not in dialysis and if tolerated after dialysis.  Patient and husband verbalized understanding of HEP.    TREATMENT:                                                                                                             DATE: 09/21/2023 Prosthetic Training with Transtibial Prosthesis: - PT demo & verbal  cues on sequencing for straight path gait with RW, turning 90* to right & to left to position to sit. (advancing rolling walker after each footsteps until the back legs of RW are even with her front toes to keep walker closer and improve step through gait pattern for balance).  Verbal cues to breathe as she walks. -pt amb 10' 15\' 10" , 15" with RW with minA for straight path and min to modA to turn 90* to position to sit.   -Sit to stand X 6 with RW throughout the session with cues to lean forward more upon rising to improve performance and require less assistance with this, did improve some with practice  Therapeutic Exercise: Seated in wheelchair, lumbar flexion stretch with pball roll outs 5 sec X 10 Seated in wheelchair, lumbar extension into yellow ball roll 5 sec X 10 Seated in wheelchair with back off of backrest for more core activation performing lumbar lateral flexion AROM X 10 each side Nu step seat #9 X 5 min UE/LE, discontinued using Rt UE do to pain  09/16/2023 Prosthetic Training with Transtibial Prosthesis: - PT demo & verbal cues on sequencing for straight path gait with RW, turning 90* to right & to left to position to sit.  Pt's husband took video on his phone.  PT recommended viewing the 3 videos to help remember the proper technique.  (advancing rolling walker after each footsteps until the back legs of RW are even with her front toes to keep walker closer and improve step through gait pattern for balance).  Patient and  her husband verbalized understanding. -pt amb 10' & 84' with RW with minA for straight path and modA to turn 90* to position to sit.    Therapeutic Exercise: - PT demo lateral trunk bend to right side with patient protecting right arm due to significant pain from rotator cuff tear.  Patient performed 10 reps with no increased pain. - Patient performed 10 reps alternating hip flexion/marching.   TREATMENT:                                                                                                             DATE: 09/14/2023 Prosthetic Training with Transtibial Prosthesis: - Sit to stand 22" w/c and 18" chair with armrest to RW with modA and verbal cues. - Patient ambulated 64' x2 with RW with min assist on a straight path and mod assist for turning to position in front of the chair.  PT demo and verbal cues on sequencing (advancing rolling walker after each footsteps until the back legs of RW are even with her front toes to keep walker closer and improve step through gait pattern for balance). PT demo & verbal cues on sequencing for turning.    Therapeutic Activities: - PT had long discussion/education session on establishing a bowel program including transfers to [facility] and ADLs for cleaning including standing.  Patient and husband verbalized understanding with plans to initiate currently on the mornings that she does not have dialysis.    PATIENT EDUCATION: PATIENT  EDUCATED ON FOLLOWING PROSTHETIC CARE: Education details: Use of stockinette under proximal liner to decrease itching sensation, no wounds presents to PT recommended not using Vive wear under liner Skin check, Prosthetic cleaning, Propper donning, and Proper wear schedule/adjustment Prosthetic wear tolerance: 3 hours 2x/day, 4 days/week Person educated: Patient and Spouse Education method: Explanation, Tactile cues, and Verbal cues Education comprehension: verbalized understanding, verbal cues required, tactile cues required, and  needs further education  HOME EXERCISE PROGRAM: Access Code: [email or web address] URL: [email or web address] Date: 09/23/2023 Prepared by: Lorie Rook  Exercises - Supine Bridge  - 1 x daily - 4 x weekly - 2 sets - 5 reps - 2 seconds hold - Supine Lower Trunk Rotation  - 1 x daily - 4 x weekly - 2 sets - 5 reps - 5 seconds hold - Supine Heel Slide with Strap  - 1 x daily - 4 x weekly - 2 sets - 5 reps - 2-3 seconds hold - Supine Hip Abduction  - 1 x daily - 4 x weekly - 2 sets - 5 reps - 2-3 seconds hold - Seated Hip Flexion Toward Target  - 1 x daily - 4 x weekly - 2 sets - 5 reps - 5 seconds hold - Seated Eccentric Abdominal Lean Back  - 1 x daily - 4 x weekly - 2 sets - 5 reps - 5 seconds hold - Seated Sidebending  - 1 x daily - 4 x weekly - 2 sets - 5 reps - 5 seconds hold - Seated Trunk Rotation with Crossed Arms  - 1 x daily - 4 x weekly - 2 sets - 5 reps - 5 seconds hold - Seated March  - 1 x daily - 4 x weekly - 2 sets - 10 reps - 5 seconds hold - Seated Knee Flexion Extension AROM   - 1 x daily - 4 x weekly - 2 sets - 10 reps - 5 seconds hold    ASSESSMENT:  CLINICAL IMPRESSION: PT reviewed need for compliance with HEP to improve strength which is directly tied to improving her functional mobility.  Patient verbalized understanding.  Patient had improved sit to and from stand today.  Patient continues to benefit from skilled PT.  OBJECTIVE IMPAIRMENTS: Abnormal gait, decreased activity tolerance, decreased balance, decreased endurance, decreased knowledge of condition, decreased knowledge of use of DME, decreased mobility, difficulty walking, decreased ROM, decreased strength, decreased safety awareness, postural dysfunction, prosthetic dependency , and pain.   ACTIVITY LIMITATIONS: standing, transfers, and locomotion level  PARTICIPATION LIMITATIONS: community activity, household mobility and dependency / burden of care on family  PERSONAL FACTORS: Age, Fitness,  Past/current experiences, Time since onset of injury/illness/exacerbation, and 3+ comorbidities: see PMH  are also affecting patient's functional outcome.   REHAB POTENTIAL: Good  CLINICAL DECISION MAKING: Evolving/moderate complexity  EVALUATION COMPLEXITY: Moderate   GOALS: Goals reviewed with patient? Yes  SHORT TERM GOALS: Target date: 10/07/2023:  Patient & husband verbalized patient able to perform scooting transfer to / from [facility] and sit to/from stand [facility] to RW with husband's assistance safely. Baseline: SEE OBJECTIVE DATA Goal status:   Ongoing   09/23/2023 2.  Patient able to sit to stand w/c to rolling walker with min assistance. Baseline: SEE OBJECTIVE DATA Goal status: Ongoing  09/23/2023  3. Patient ambulates 67ft including turning 90* to position to sit with RW & prosthesis with modA.  Baseline: Ongoing  09/23/2023  UPDATED LONG TERM GOALS: Target date: 12/03/2023  Patient & husband continues to demonstrate &  verbalize understanding of prosthetic care and wears prosthesis >80% of awake hours daily to enable safe utilization of prosthesis. Baseline: SEE OBJECTIVE DATA Goal status: Ongoing   09/23/2023  Patient and husband report patient is able to stand to enable cleaning after toileting safely with no issues Baseline: SEE OBJECTIVE DATA Goal status: Ongoing  09/23/2023  Patient able to perform sit to/from stand transfers with minA.  Baseline: SEE OBJECTIVE DATA Goal status: Ongoing  09/23/2023  Patient ambulates >76ft including turning 90* to position to sit with prosthesis & RW with minA with husband's assistance safely. Baseline: SEE OBJECTIVE DATA Goal status: Ongoing  09/23/2023  Patient understanding of ongoing HEP. Baseline: SEE OBJECTIVE DATA Goal status:  Ongoing  09/23/2023  PLAN:  PT FREQUENCY: 2x/week  PT DURATION: 12 weeks  PLANNED INTERVENTIONS: 97164- PT Re-evaluation, 97110-Therapeutic exercises, 97530- Therapeutic activity, 97112- Neuromuscular  re-education, [phone]- Self Care, 82956- Gait training, [phone]- Prosthetic training, Patient/Family education, Balance training, DME instructions, Therapeutic exercises, Therapeutic activity, Neuromuscular re-education, Gait training, and Self Care  PLAN FOR NEXT SESSION: Check on compliance with HEP.  Continue to work towards updated STG's.   Aashi Derrington, PT, DPT 09/23/2023, 4:10 PM

## 2023-09-24 DIAGNOSIS — Z992 Dependence on renal dialysis: Secondary | ICD-10-CM | POA: Diagnosis not present

## 2023-09-24 DIAGNOSIS — N186 End stage renal disease: Secondary | ICD-10-CM | POA: Diagnosis not present

## 2023-09-24 DIAGNOSIS — N2581 Secondary hyperparathyroidism of renal origin: Secondary | ICD-10-CM | POA: Diagnosis not present

## 2023-09-24 DIAGNOSIS — D631 Anemia in chronic kidney disease: Secondary | ICD-10-CM | POA: Diagnosis not present

## 2023-09-24 DIAGNOSIS — D689 Coagulation defect, unspecified: Secondary | ICD-10-CM | POA: Diagnosis not present

## 2023-09-28 ENCOUNTER — Encounter: Admitting: Physical Therapy

## 2023-09-28 ENCOUNTER — Ambulatory Visit: Payer: PPO | Admitting: Podiatry

## 2023-09-28 DIAGNOSIS — G609 Hereditary and idiopathic neuropathy, unspecified: Secondary | ICD-10-CM | POA: Diagnosis not present

## 2023-09-28 DIAGNOSIS — E039 Hypothyroidism, unspecified: Secondary | ICD-10-CM | POA: Diagnosis not present

## 2023-09-28 DIAGNOSIS — N186 End stage renal disease: Secondary | ICD-10-CM | POA: Diagnosis not present

## 2023-09-28 DIAGNOSIS — I10 Essential (primary) hypertension: Secondary | ICD-10-CM | POA: Diagnosis not present

## 2023-09-28 DIAGNOSIS — R748 Abnormal levels of other serum enzymes: Secondary | ICD-10-CM | POA: Diagnosis not present

## 2023-09-28 DIAGNOSIS — E11319 Type 2 diabetes mellitus with unspecified diabetic retinopathy without macular edema: Secondary | ICD-10-CM | POA: Diagnosis not present

## 2023-09-28 DIAGNOSIS — I2510 Atherosclerotic heart disease of native coronary artery without angina pectoris: Secondary | ICD-10-CM | POA: Diagnosis not present

## 2023-09-28 DIAGNOSIS — E11621 Type 2 diabetes mellitus with foot ulcer: Secondary | ICD-10-CM | POA: Diagnosis not present

## 2023-09-28 DIAGNOSIS — E1165 Type 2 diabetes mellitus with hyperglycemia: Secondary | ICD-10-CM | POA: Diagnosis not present

## 2023-09-28 DIAGNOSIS — E7800 Pure hypercholesterolemia, unspecified: Secondary | ICD-10-CM | POA: Diagnosis not present

## 2023-09-29 DIAGNOSIS — D689 Coagulation defect, unspecified: Secondary | ICD-10-CM | POA: Diagnosis not present

## 2023-09-29 DIAGNOSIS — N186 End stage renal disease: Secondary | ICD-10-CM | POA: Diagnosis not present

## 2023-09-29 DIAGNOSIS — N2581 Secondary hyperparathyroidism of renal origin: Secondary | ICD-10-CM | POA: Diagnosis not present

## 2023-09-29 DIAGNOSIS — Z992 Dependence on renal dialysis: Secondary | ICD-10-CM | POA: Diagnosis not present

## 2023-09-30 ENCOUNTER — Encounter: Admitting: Physical Therapy

## 2023-10-01 ENCOUNTER — Emergency Department (HOSPITAL_COMMUNITY)

## 2023-10-01 ENCOUNTER — Other Ambulatory Visit: Payer: Self-pay

## 2023-10-01 ENCOUNTER — Encounter (HOSPITAL_COMMUNITY): Payer: Self-pay

## 2023-10-01 ENCOUNTER — Inpatient Hospital Stay (HOSPITAL_COMMUNITY)
Admission: EM | Admit: 2023-10-01 | Discharge: 2023-10-09 | DRG: 689 | Disposition: A | Discharge status: Home / Self Care | Attending: Internal Medicine | Admitting: Internal Medicine

## 2023-10-01 DIAGNOSIS — F419 Anxiety disorder, unspecified: Secondary | ICD-10-CM | POA: Diagnosis present

## 2023-10-01 DIAGNOSIS — E876 Hypokalemia: Secondary | ICD-10-CM | POA: Diagnosis not present

## 2023-10-01 DIAGNOSIS — R918 Other nonspecific abnormal finding of lung field: Secondary | ICD-10-CM | POA: Diagnosis not present

## 2023-10-01 DIAGNOSIS — M898X9 Other specified disorders of bone, unspecified site: Secondary | ICD-10-CM | POA: Diagnosis present

## 2023-10-01 DIAGNOSIS — E11649 Type 2 diabetes mellitus with hypoglycemia without coma: Secondary | ICD-10-CM | POA: Diagnosis not present

## 2023-10-01 DIAGNOSIS — Z1612 Extended spectrum beta lactamase (ESBL) resistance: Secondary | ICD-10-CM | POA: Diagnosis not present

## 2023-10-01 DIAGNOSIS — E669 Obesity, unspecified: Secondary | ICD-10-CM | POA: Diagnosis present

## 2023-10-01 DIAGNOSIS — Z9981 Dependence on supplemental oxygen: Secondary | ICD-10-CM

## 2023-10-01 DIAGNOSIS — R4182 Altered mental status, unspecified: Secondary | ICD-10-CM | POA: Diagnosis not present

## 2023-10-01 DIAGNOSIS — E039 Hypothyroidism, unspecified: Secondary | ICD-10-CM | POA: Diagnosis present

## 2023-10-01 DIAGNOSIS — D631 Anemia in chronic kidney disease: Secondary | ICD-10-CM | POA: Diagnosis present

## 2023-10-01 DIAGNOSIS — I252 Old myocardial infarction: Secondary | ICD-10-CM

## 2023-10-01 DIAGNOSIS — N2581 Secondary hyperparathyroidism of renal origin: Secondary | ICD-10-CM | POA: Diagnosis not present

## 2023-10-01 DIAGNOSIS — N3001 Acute cystitis with hematuria: Secondary | ICD-10-CM

## 2023-10-01 DIAGNOSIS — F32A Depression, unspecified: Secondary | ICD-10-CM | POA: Diagnosis present

## 2023-10-01 DIAGNOSIS — I517 Cardiomegaly: Secondary | ICD-10-CM | POA: Diagnosis not present

## 2023-10-01 DIAGNOSIS — T383X5A Adverse effect of insulin and oral hypoglycemic [antidiabetic] drugs, initial encounter: Secondary | ICD-10-CM | POA: Diagnosis not present

## 2023-10-01 DIAGNOSIS — Z8249 Family history of ischemic heart disease and other diseases of the circulatory system: Secondary | ICD-10-CM

## 2023-10-01 DIAGNOSIS — E1122 Type 2 diabetes mellitus with diabetic chronic kidney disease: Secondary | ICD-10-CM | POA: Diagnosis present

## 2023-10-01 DIAGNOSIS — Z992 Dependence on renal dialysis: Secondary | ICD-10-CM | POA: Diagnosis not present

## 2023-10-01 DIAGNOSIS — N281 Cyst of kidney, acquired: Secondary | ICD-10-CM | POA: Diagnosis not present

## 2023-10-01 DIAGNOSIS — E1140 Type 2 diabetes mellitus with diabetic neuropathy, unspecified: Secondary | ICD-10-CM | POA: Diagnosis not present

## 2023-10-01 DIAGNOSIS — I771 Stricture of artery: Secondary | ICD-10-CM | POA: Diagnosis not present

## 2023-10-01 DIAGNOSIS — Z89511 Acquired absence of right leg below knee: Secondary | ICD-10-CM

## 2023-10-01 DIAGNOSIS — I132 Hypertensive heart and chronic kidney disease with heart failure and with stage 5 chronic kidney disease, or end stage renal disease: Secondary | ICD-10-CM | POA: Diagnosis present

## 2023-10-01 DIAGNOSIS — E871 Hypo-osmolality and hyponatremia: Secondary | ICD-10-CM | POA: Diagnosis not present

## 2023-10-01 DIAGNOSIS — Z604 Social exclusion and rejection: Secondary | ICD-10-CM | POA: Diagnosis present

## 2023-10-01 DIAGNOSIS — I480 Paroxysmal atrial fibrillation: Secondary | ICD-10-CM | POA: Diagnosis not present

## 2023-10-01 DIAGNOSIS — Z560 Unemployment, unspecified: Secondary | ICD-10-CM

## 2023-10-01 DIAGNOSIS — Z885 Allergy status to narcotic agent status: Secondary | ICD-10-CM

## 2023-10-01 DIAGNOSIS — R531 Weakness: Secondary | ICD-10-CM | POA: Diagnosis not present

## 2023-10-01 DIAGNOSIS — I5022 Chronic systolic (congestive) heart failure: Secondary | ICD-10-CM | POA: Diagnosis present

## 2023-10-01 DIAGNOSIS — R0902 Hypoxemia: Secondary | ICD-10-CM | POA: Diagnosis not present

## 2023-10-01 DIAGNOSIS — I482 Chronic atrial fibrillation, unspecified: Secondary | ICD-10-CM | POA: Diagnosis not present

## 2023-10-01 DIAGNOSIS — Z794 Long term (current) use of insulin: Secondary | ICD-10-CM

## 2023-10-01 DIAGNOSIS — K5900 Constipation, unspecified: Secondary | ICD-10-CM | POA: Diagnosis not present

## 2023-10-01 DIAGNOSIS — E785 Hyperlipidemia, unspecified: Secondary | ICD-10-CM | POA: Diagnosis present

## 2023-10-01 DIAGNOSIS — I2510 Atherosclerotic heart disease of native coronary artery without angina pectoris: Secondary | ICD-10-CM | POA: Diagnosis present

## 2023-10-01 DIAGNOSIS — I6782 Cerebral ischemia: Secondary | ICD-10-CM | POA: Diagnosis not present

## 2023-10-01 DIAGNOSIS — R410 Disorientation, unspecified: Secondary | ICD-10-CM | POA: Diagnosis not present

## 2023-10-01 DIAGNOSIS — I120 Hypertensive chronic kidney disease with stage 5 chronic kidney disease or end stage renal disease: Secondary | ICD-10-CM | POA: Diagnosis not present

## 2023-10-01 DIAGNOSIS — R110 Nausea: Secondary | ICD-10-CM | POA: Diagnosis not present

## 2023-10-01 DIAGNOSIS — Z87440 Personal history of urinary (tract) infections: Secondary | ICD-10-CM

## 2023-10-01 DIAGNOSIS — K5730 Diverticulosis of large intestine without perforation or abscess without bleeding: Secondary | ICD-10-CM | POA: Diagnosis not present

## 2023-10-01 DIAGNOSIS — Z7901 Long term (current) use of anticoagulants: Secondary | ICD-10-CM

## 2023-10-01 DIAGNOSIS — Z993 Dependence on wheelchair: Secondary | ICD-10-CM

## 2023-10-01 DIAGNOSIS — I10 Essential (primary) hypertension: Secondary | ICD-10-CM | POA: Diagnosis not present

## 2023-10-01 DIAGNOSIS — Z87891 Personal history of nicotine dependence: Secondary | ICD-10-CM

## 2023-10-01 DIAGNOSIS — B9620 Unspecified Escherichia coli [E. coli] as the cause of diseases classified elsewhere: Secondary | ICD-10-CM | POA: Diagnosis not present

## 2023-10-01 DIAGNOSIS — M159 Polyosteoarthritis, unspecified: Secondary | ICD-10-CM | POA: Diagnosis not present

## 2023-10-01 DIAGNOSIS — Z8673 Personal history of transient ischemic attack (TIA), and cerebral infarction without residual deficits: Secondary | ICD-10-CM

## 2023-10-01 DIAGNOSIS — K8020 Calculus of gallbladder without cholecystitis without obstruction: Secondary | ICD-10-CM | POA: Diagnosis not present

## 2023-10-01 DIAGNOSIS — N390 Urinary tract infection, site not specified: Principal | ICD-10-CM | POA: Diagnosis present

## 2023-10-01 DIAGNOSIS — Z955 Presence of coronary angioplasty implant and graft: Secondary | ICD-10-CM

## 2023-10-01 DIAGNOSIS — J9611 Chronic respiratory failure with hypoxia: Secondary | ICD-10-CM | POA: Diagnosis not present

## 2023-10-01 DIAGNOSIS — R0989 Other specified symptoms and signs involving the circulatory and respiratory systems: Secondary | ICD-10-CM | POA: Diagnosis not present

## 2023-10-01 DIAGNOSIS — Z90710 Acquired absence of both cervix and uterus: Secondary | ICD-10-CM

## 2023-10-01 DIAGNOSIS — R140 Abdominal distension (gaseous): Secondary | ICD-10-CM | POA: Diagnosis not present

## 2023-10-01 DIAGNOSIS — G9341 Metabolic encephalopathy: Secondary | ICD-10-CM | POA: Diagnosis not present

## 2023-10-01 DIAGNOSIS — K5909 Other constipation: Secondary | ICD-10-CM | POA: Diagnosis not present

## 2023-10-01 DIAGNOSIS — N186 End stage renal disease: Secondary | ICD-10-CM | POA: Diagnosis present

## 2023-10-01 DIAGNOSIS — I9589 Other hypotension: Secondary | ICD-10-CM | POA: Diagnosis present

## 2023-10-01 DIAGNOSIS — E1165 Type 2 diabetes mellitus with hyperglycemia: Secondary | ICD-10-CM | POA: Diagnosis present

## 2023-10-01 DIAGNOSIS — Z79890 Hormone replacement therapy (postmenopausal): Secondary | ICD-10-CM

## 2023-10-01 DIAGNOSIS — I4891 Unspecified atrial fibrillation: Secondary | ICD-10-CM | POA: Diagnosis present

## 2023-10-01 DIAGNOSIS — B9629 Other Escherichia coli [E. coli] as the cause of diseases classified elsewhere: Secondary | ICD-10-CM | POA: Diagnosis not present

## 2023-10-01 DIAGNOSIS — R1110 Vomiting, unspecified: Secondary | ICD-10-CM | POA: Diagnosis not present

## 2023-10-01 DIAGNOSIS — I5023 Acute on chronic systolic (congestive) heart failure: Secondary | ICD-10-CM | POA: Diagnosis present

## 2023-10-01 DIAGNOSIS — Z881 Allergy status to other antibiotic agents status: Secondary | ICD-10-CM

## 2023-10-01 DIAGNOSIS — Z79899 Other long term (current) drug therapy: Secondary | ICD-10-CM

## 2023-10-01 DIAGNOSIS — K219 Gastro-esophageal reflux disease without esophagitis: Secondary | ICD-10-CM | POA: Diagnosis present

## 2023-10-01 LAB — COMPREHENSIVE METABOLIC PANEL WITH GFR
ALT: 15 U/L (ref 0–44)
AST: 17 U/L (ref 15–41)
Albumin: 3.1 g/dL — ABNORMAL LOW (ref 3.5–5.0)
Alkaline Phosphatase: 207 U/L — ABNORMAL HIGH (ref 38–126)
Anion gap: 16 — ABNORMAL HIGH (ref 5–15)
BUN: 34 mg/dL — ABNORMAL HIGH (ref 8–23)
CO2: 25 mmol/L (ref 22–32)
Calcium: 9.6 mg/dL (ref 8.9–10.3)
Chloride: 87 mmol/L — ABNORMAL LOW (ref 98–111)
Creatinine, Ser: 4.37 mg/dL — ABNORMAL HIGH (ref 0.44–1.00)
GFR, Estimated: 10 mL/min — ABNORMAL LOW (ref >60)
Glucose, Bld: 163 mg/dL — ABNORMAL HIGH (ref 70–99)
Potassium: 3.3 mmol/L — ABNORMAL LOW (ref 3.5–5.1)
Sodium: 128 mmol/L — ABNORMAL LOW (ref 135–145)
Total Bilirubin: 1 mg/dL (ref 0.0–1.2)
Total Protein: 7.5 g/dL (ref 6.5–8.1)

## 2023-10-01 LAB — BLOOD GAS, VENOUS
Acid-Base Excess: 8.5 mmol/L — ABNORMAL HIGH (ref 0.0–2.0)
Bicarbonate: 34.5 mmol/L — ABNORMAL HIGH (ref 20.0–28.0)
Drawn by: 6246
O2 Saturation: 46.6 %
Patient temperature: 36.6
pCO2, Ven: 51 mmHg (ref 44–60)
pH, Ven: 7.44 — ABNORMAL HIGH (ref 7.25–7.43)
pO2, Ven: <31 mmHg — CL (ref 32–45)

## 2023-10-01 LAB — URINALYSIS, ROUTINE W REFLEX MICROSCOPIC
Bilirubin Urine: NEGATIVE
Glucose, UA: NEGATIVE mg/dL
Ketones, ur: NEGATIVE mg/dL
Nitrite: NEGATIVE
Protein, ur: 100 mg/dL — AB
Specific Gravity, Urine: 1.011 (ref 1.005–1.030)
WBC, UA: >50 WBC/hpf (ref 0–5)
pH: 5 (ref 5.0–8.0)

## 2023-10-01 LAB — CBC
HCT: 40.8 % (ref 36.0–46.0)
Hemoglobin: 12.7 g/dL (ref 12.0–15.0)
MCH: 27.8 pg (ref 26.0–34.0)
MCHC: 31.1 g/dL (ref 30.0–36.0)
MCV: 89.3 fL (ref 80.0–100.0)
Platelets: 452 10*3/uL — ABNORMAL HIGH (ref 150–400)
RBC: 4.57 MIL/uL (ref 3.87–5.11)
RDW: 17.3 % — ABNORMAL HIGH (ref 11.5–15.5)
WBC: 9.5 10*3/uL (ref 4.0–10.5)
nRBC: 0 % (ref 0.0–0.2)

## 2023-10-01 MED ORDER — NITROGLYCERIN 0.4 MG SL SUBL: 0.4000 mg | SUBLINGUAL_TABLET | SUBLINGUAL | Status: DC | PRN

## 2023-10-01 MED ORDER — SODIUM CHLORIDE 0.9 % IV SOLN
1.0000 g | INTRAVENOUS | Status: AC
  Administered 2023-10-02 – 2023-10-03 (×2): 1 g via INTRAVENOUS
  Filled 2023-10-01: qty 20

## 2023-10-01 MED ORDER — INSULIN ASPART 100 UNIT/ML IJ SOLN
0.0000 [IU] | Freq: Three times a day (TID) | INTRAMUSCULAR | Status: DC
  Administered 2023-10-02: 3 [IU] via SUBCUTANEOUS
  Administered 2023-10-02: 4 [IU] via SUBCUTANEOUS
  Administered 2023-10-02: 2 [IU] via SUBCUTANEOUS
  Administered 2023-10-03: 3 [IU] via SUBCUTANEOUS
  Administered 2023-10-03: 2 [IU] via SUBCUTANEOUS
  Administered 2023-10-04 (×2): 3 [IU] via SUBCUTANEOUS
  Administered 2023-10-07: 2 [IU] via SUBCUTANEOUS
  Administered 2023-10-07: 4 [IU] via SUBCUTANEOUS
  Administered 2023-10-07: 5 [IU] via SUBCUTANEOUS
  Administered 2023-10-08 – 2023-10-09 (×2): 2 [IU] via SUBCUTANEOUS

## 2023-10-01 MED ORDER — ATORVASTATIN CALCIUM 40 MG PO TABS
80.0000 mg | ORAL_TABLET | Freq: Every day | ORAL | Status: DC
  Administered 2023-10-02 – 2023-10-09 (×8): 80 mg via ORAL
  Filled 2023-10-01 (×8): qty 2

## 2023-10-01 MED ORDER — OXYCODONE-ACETAMINOPHEN 5-325 MG PO TABS
1.0000 | ORAL_TABLET | Freq: Four times a day (QID) | ORAL | Status: DC | PRN
  Administered 2023-10-05: 1 via ORAL
  Filled 2023-10-01: qty 1

## 2023-10-01 MED ORDER — POLYETHYLENE GLYCOL 3350 17 G PO PACK
17.0000 g | PACK | Freq: Every day | ORAL | Status: DC
  Administered 2023-10-01 – 2023-10-09 (×6): 17 g via ORAL
  Filled 2023-10-01 (×8): qty 1

## 2023-10-01 MED ORDER — FOLIC ACID 1 MG PO TABS
1.0000 mg | ORAL_TABLET | Freq: Every day | ORAL | Status: DC
  Administered 2023-10-02 – 2023-10-09 (×8): 1 mg via ORAL
  Filled 2023-10-01 (×8): qty 1

## 2023-10-01 MED ORDER — IOHEXOL 300 MG/ML  SOLN
100.0000 mL | Freq: Once | INTRAMUSCULAR | Status: AC | PRN
  Administered 2023-10-01: 100 mL via INTRAVENOUS

## 2023-10-01 MED ORDER — CLONAZEPAM 0.25 MG PO TBDP
0.5000 mg | ORAL_TABLET | Freq: Two times a day (BID) | ORAL | Status: DC
  Administered 2023-10-01 – 2023-10-09 (×16): 0.5 mg via ORAL
  Filled 2023-10-01 (×16): qty 2

## 2023-10-01 MED ORDER — INSULIN GLARGINE-YFGN 100 UNIT/ML ~~LOC~~ SOLN
22.0000 [IU] | Freq: Every day | SUBCUTANEOUS | Status: DC
  Administered 2023-10-02: 22 [IU] via SUBCUTANEOUS
  Filled 2023-10-01 (×3): qty 0.22

## 2023-10-01 MED ORDER — LEVOTHYROXINE SODIUM 50 MCG PO TABS
50.0000 ug | ORAL_TABLET | Freq: Every day | ORAL | Status: DC
  Administered 2023-10-02 – 2023-10-09 (×8): 50 ug via ORAL
  Filled 2023-10-01 (×8): qty 1

## 2023-10-01 MED ORDER — ACETAMINOPHEN 650 MG RE SUPP: 650.0000 mg | Freq: Four times a day (QID) | RECTAL | Status: DC | PRN

## 2023-10-01 MED ORDER — SODIUM CHLORIDE 0.9 % IV SOLN
1.0000 g | Freq: Once | INTRAVENOUS | Status: AC
  Administered 2023-10-01: 1 g via INTRAVENOUS
  Filled 2023-10-01: qty 20

## 2023-10-01 MED ORDER — LACTULOSE ENCEPHALOPATHY 10 GM/15ML PO SOLN
10.0000 g | Freq: Every day | ORAL | Status: DC | PRN
  Administered 2023-10-04: 10 g via ORAL
  Filled 2023-10-01: qty 15

## 2023-10-01 MED ORDER — APIXABAN 5 MG PO TABS
5.0000 mg | ORAL_TABLET | Freq: Two times a day (BID) | ORAL | Status: DC
  Administered 2023-10-01 – 2023-10-09 (×16): 5 mg via ORAL
  Filled 2023-10-01 (×13): qty 1
  Filled 2023-10-01: qty 2
  Filled 2023-10-01 (×2): qty 1

## 2023-10-01 MED ORDER — LORAZEPAM 2 MG/ML IJ SOLN
1.0000 mg | Freq: Once | INTRAMUSCULAR | Status: AC
  Administered 2023-10-01: 1 mg via INTRAVENOUS
  Filled 2023-10-01: qty 1

## 2023-10-01 MED ORDER — CALCITRIOL 0.25 MCG PO CAPS
2.2500 ug | ORAL_CAPSULE | ORAL | Status: DC
  Filled 2023-10-01: qty 9

## 2023-10-01 MED ORDER — MIDODRINE HCL 5 MG PO TABS
10.0000 mg | ORAL_TABLET | Freq: Three times a day (TID) | ORAL | Status: DC | PRN
  Administered 2023-10-03 – 2023-10-08 (×3): 10 mg via ORAL
  Filled 2023-10-01 (×3): qty 2

## 2023-10-01 MED ORDER — VENLAFAXINE HCL ER 75 MG PO CP24
75.0000 mg | ORAL_CAPSULE | Freq: Every day | ORAL | Status: DC
  Administered 2023-10-02 – 2023-10-09 (×8): 75 mg via ORAL
  Filled 2023-10-01 (×8): qty 1

## 2023-10-01 MED ORDER — ACETAMINOPHEN 325 MG PO TABS
650.0000 mg | ORAL_TABLET | Freq: Four times a day (QID) | ORAL | Status: DC | PRN
  Administered 2023-10-09 (×2): 650 mg via ORAL
  Filled 2023-10-01 (×2): qty 2

## 2023-10-01 MED ORDER — AMIODARONE HCL 200 MG PO TABS
200.0000 mg | ORAL_TABLET | Freq: Every day | ORAL | Status: DC
Start: 2023-10-02 — End: 2023-10-09
  Administered 2023-10-02 – 2023-10-09 (×7): 200 mg via ORAL
  Filled 2023-10-01 (×8): qty 1

## 2023-10-01 MED ORDER — SEVELAMER CARBONATE 800 MG PO TABS
1600.0000 mg | ORAL_TABLET | Freq: Three times a day (TID) | ORAL | Status: DC
  Administered 2023-10-02 – 2023-10-09 (×18): 1600 mg via ORAL
  Filled 2023-10-01 (×18): qty 2

## 2023-10-01 MED ORDER — PANTOPRAZOLE SODIUM 40 MG PO TBEC
40.0000 mg | DELAYED_RELEASE_TABLET | Freq: Every day | ORAL | Status: DC
  Administered 2023-10-01 – 2023-10-07 (×7): 40 mg via ORAL
  Filled 2023-10-01 (×7): qty 1

## 2023-10-01 MED ORDER — MELATONIN 3 MG PO TABS
3.0000 mg | ORAL_TABLET | Freq: Every day | ORAL | Status: DC
  Administered 2023-10-01 – 2023-10-08 (×8): 3 mg via ORAL
  Filled 2023-10-01 (×8): qty 1

## 2023-10-01 NOTE — H&P (Signed)
 [facility] H&P   Patient Demographics:    Susan [name], is a 73 y.o. female  MRN: [id number]   DOB - 17-Jan-1951  Admit Date - 10/01/2023  Outpatient Primary MD for the patient is Austine Lefort, MD  Referring MD/NP/PA: Dr Jodell Munda  Patient coming from: HD center  Chief Complaint  Patient presents with   Altered Mental Status      HPI:    Susan [name]  is a 73 y.o. female, with PMH significant for ESRD-HD-TTS, PAF on Eliquis , CAD status post PCI, chronic systolic CHF with EF 30-35%, DM2, HTN, HLD, hypothyroidism, anemia of CKD, right BKA, and depression/anxiety with recent hospitalization on 3/12, ESBL UTI and bacteremia.  The right subclavian HD catheter exchanged during that admission - [name] was sent from HD center for evaluation, patient reports she has not been feeling well, having nausea, occasional vomiting, poor appetite, constipation, as well she does endorse dysuria, was found to be very sick at dialysis center, so was sent to ED for evaluation.. - In ED, her UA was positive, she does endorse dysuria, was positive, no leukocytosis, afebrile, sodium was low at 128, potassium at 3.3, CT abdomen pelvis with diverticulosis, and constipation, treated hospitalist consulted to admit.   Review of systems:      A full 10 point Review of Systems was done, except as stated above, all other Review of Systems were negative.   With Past History of the following :    Past Medical History:  Diagnosis Date   Anemia    hx   Anxiety    Arthritis    "generalized" (03/15/2014)   CAD (coronary artery disease)    MI in 2000 - MI  2007 - treated bare metal stent (no nuclear since then as 9/11)   Carotid artery disease (HCC)    Chronic diastolic heart failure (HCC)    a) ECHO (08/2013) EF 55-60% and RV function nl b) RHC (08/2013) RA 4, RV 30/5/7, PA 25/10 (16), PCWP 7, Fick CO/CI 6.3/2.7, PVR  1.5 WU, PA 61 and 66%   Daily headache    "~ every other day; since I fell in June" (03/15/2014)   Depression    Diabetic retinopathy (HCC)    Dyslipidemia    ESRD (end stage renal disease) (HCC)    Dialysis on Tues Thurs Sat   Exertional shortness of breath    GERD (gastroesophageal reflux disease)    History of blood transfusion    History of kidney stones    HTN (hypertension)    Hypothyroidism    Myocardial infarction (HCC)    Obesity    Osteoarthritis    PAF (paroxysmal atrial fibrillation) (HCC)    Peripheral neuropathy    bilateral feet/hands   PONV (postoperative nausea and vomiting)    RBBB (right bundle branch block)    Old   Stroke (HCC)    mini  strokes   Type II diabetes mellitus (HCC)    Type II, Freesyle libre left upper arm. patient has omnipod insulin  pump with Novolin R Insulin       Past Surgical History:  Procedure Laterality Date   A/V FISTULAGRAM Left 11/07/2022   Procedure: A/V Fistulagram;  Surgeon: Carlene Che, MD;  [facility];  Service: Cardiovascular;  Laterality: Left;   ABDOMINAL HYSTERECTOMY  1980's   AMPUTATION Right 02/24/2018   Procedure: RIGHT FOOT GREAT TOE AND 2ND TOE AMPUTATION;  Surgeon: Timothy Ford, MD;  Location: [facility] OR;  Service: Orthopedics;  Laterality: Right;   AMPUTATION Right 04/30/2018   Procedure: RIGHT TRANSMETATARSAL AMPUTATION;  Surgeon: Timothy Ford, MD;  Location: [facility] OR;  Service: Orthopedics;  Laterality: Right;   AMPUTATION Right 05/02/2022   Procedure: RIGHT BELOW KNEE AMPUTATION;  Surgeon: Timothy Ford, MD;  Location: [facility] OR;  Service: Orthopedics;  Laterality: Right;   APPLICATION OF WOUND VAC Right 06/13/2022   Procedure: APPLICATION OF WOUND VAC;  Surgeon: Timothy Ford, MD;  [facility]: [facility] OR;  Service: Orthopedics;  Laterality: Right;   APPLICATION OF WOUND VAC Left 11/14/2022   Procedure: APPLICATION OF WOUND VAC;  Surgeon: Timothy Ford, MD;  [facility];  Service: Orthopedics;   Laterality: Left;   AV FISTULA PLACEMENT Left 04/02/2022   Procedure: LEFT ARM ARTERIOVENOUS (AV) FISTULA CREATION;  Surgeon: Margherita Shell, MD;  Location: [facility];  Service: Vascular;  Laterality: Left;  PERIPHERAL NERVE BLOCK   AV FISTULA PLACEMENT Right 04/27/2023   Procedure: RIGHT ARM BRACHIOBASILIC ARTERIOVENOUS (AV) FISTULA CREATION;  Surgeon: Carlene Che, MD;  Location: [facility];  Service: Vascular;  Laterality: Right;   BASCILIC VEIN TRANSPOSITION Left 07/31/2022   Procedure: LEFT ARM SECOND STAGE BASILIC VEIN TRANSPOSITION;  Surgeon: Margherita Shell, MD;  [facility];  Service: Vascular;  Laterality: Left;   BIOPSY  05/27/2020   Procedure: BIOPSY;  Surgeon: Vinetta Greening, DO;  [facility]: [facility];  Service: Endoscopy;;   CATARACT EXTRACTION, BILATERAL Bilateral ?2013   COLONOSCOPY W/ POLYPECTOMY     COLONOSCOPY WITH PROPOFOL  N/A 03/13/2019   Procedure: COLONOSCOPY WITH PROPOFOL ;  Surgeon: Nannette Babe, MD;  Location: [facility] ENDOSCOPY;  Service: Gastroenterology;  Laterality: N/A;   CORONARY ANGIOPLASTY WITH STENT PLACEMENT  1999; 2007   "1 + 1"   ERCP N/A 02/03/2022   Procedure: ENDOSCOPIC RETROGRADE CHOLANGIOPANCREATOGRAPHY (ERCP);  Surgeon: Alvis Jourdain, MD;  Location: [facility] ENDOSCOPY;  Service: Gastroenterology;  Laterality: N/A;   ESOPHAGOGASTRODUODENOSCOPY N/A 02/12/2023   Procedure: ESOPHAGOGASTRODUODENOSCOPY (EGD);  Surgeon: Alvis Jourdain, MD;  Location: [facility] ENDOSCOPY;  Service: Gastroenterology;  Laterality: N/A;   ESOPHAGOGASTRODUODENOSCOPY (EGD) WITH PROPOFOL  N/A 03/13/2019   Procedure: ESOPHAGOGASTRODUODENOSCOPY (EGD) WITH PROPOFOL ;  Surgeon: Nannette Babe, MD;  Location: [facility] ENDOSCOPY;  Service: Gastroenterology;  Laterality: N/A;   ESOPHAGOGASTRODUODENOSCOPY (EGD) WITH PROPOFOL  N/A 05/27/2020   Procedure: ESOPHAGOGASTRODUODENOSCOPY (EGD) WITH PROPOFOL ;  Surgeon: Vinetta Greening, DO;  [facility];  Service: Endoscopy;  Laterality: N/A;    ESOPHAGOGASTRODUODENOSCOPY (EGD) WITH PROPOFOL  N/A 09/03/2022   Procedure: ESOPHAGOGASTRODUODENOSCOPY (EGD) WITH PROPOFOL ;  Surgeon: Alvis Jourdain, MD;  Location: [facility] ENDOSCOPY;  Service: Gastroenterology;  Laterality: N/A;   EYE SURGERY Bilateral    lazer   FLEXIBLE SIGMOIDOSCOPY N/A 05/23/2022   Procedure: FLEXIBLE SIGMOIDOSCOPY;  Surgeon: Alvis Jourdain, MD;  Location: [facility] ENDOSCOPY;  Service: Gastroenterology;  Laterality: N/A;   FLEXIBLE SIGMOIDOSCOPY N/A 05/24/2022   Procedure: FLEXIBLE SIGMOIDOSCOPY;  Surgeon: Daina Drum, MD;  Location: [facility] ENDOSCOPY;  Service: Gastroenterology;  Laterality: N/A;   FLEXIBLE SIGMOIDOSCOPY N/A 09/03/2022   Procedure: FLEXIBLE SIGMOIDOSCOPY;  Surgeon: Alvis Jourdain, MD;  Location: [facility] ENDOSCOPY;  Service: Gastroenterology;  Laterality: N/A;   HEMOSTASIS CLIP PLACEMENT  03/13/2019   Procedure: HEMOSTASIS CLIP PLACEMENT;  Surgeon: Nannette Babe, MD;  Location: [facility] ENDOSCOPY;  Service: Gastroenterology;;   HEMOSTASIS CLIP PLACEMENT  05/23/2022   Procedure: HEMOSTASIS CLIP PLACEMENT;  Surgeon: Alvis Jourdain, MD;  Location: [facility] ENDOSCOPY;  Service: Gastroenterology;;   HEMOSTASIS CONTROL  05/24/2022   Procedure: HEMOSTASIS CONTROL;  Surgeon: Daina Drum, MD;  Location: [facility] ENDOSCOPY;  Service: Gastroenterology;;   HOT HEMOSTASIS N/A 05/23/2022   Procedure: HOT HEMOSTASIS (ARGON PLASMA COAGULATION/BICAP);  Surgeon: Alvis Jourdain, MD;  Location: [facility] ENDOSCOPY;  Service: Gastroenterology;  Laterality: N/A;   I & D EXTREMITY Left 05/05/2022   Procedure: IRRIGATION AND DEBRIDEMENT LEFT ARM AV FISTULA;  Surgeon: Young Hensen, MD;  Location: [facility] OR;  Service: Vascular;  Laterality: Left;   I & D EXTREMITY N/A 11/14/2022   Procedure: IRRIGATION AND DEBRIDEMENT OF LOWER EXTREMITY WOUND;  Surgeon: Timothy Ford, MD;  [facility];  Service: Orthopedics;  Laterality: N/A;   INSERTION OF DIALYSIS CATHETER Right 04/02/2022   Procedure: INSERTION OF TUNNELED DIALYSIS  CATHETER;  Surgeon: Margherita Shell, MD;  [facility];  Service: Vascular;  Laterality: Right;   IR FLUORO GUIDE CV LINE RIGHT  08/10/2023   IR VENOCAVAGRAM IVC  08/10/2023   KNEE ARTHROSCOPY Left 10/25/2006   POLYPECTOMY  03/13/2019   Procedure: POLYPECTOMY;  Surgeon: Nannette Babe, MD;  Location: [facility] ENDOSCOPY;  Service: Gastroenterology;;   REMOVAL OF STONES  02/03/2022   Procedure: REMOVAL OF STONES;  Surgeon: Alvis Jourdain, MD;  Location: [facility] ENDOSCOPY;  Service: Gastroenterology;;   REVISON OF ARTERIOVENOUS FISTULA Left 08/20/2022   Procedure: REVISON OF LEFT ARM ARTERIOVENOUS FISTULA;  Surgeon: Margherita Shell, MD;  [facility];  Service: Vascular;  Laterality: Left;   RIGHT HEART CATH N/A 07/24/2017   Procedure: RIGHT HEART CATH;  Surgeon: Mardell Shade, MD;  [facility];  Service: Cardiovascular;  Laterality: N/A;   RIGHT HEART CATHETERIZATION N/A 09/22/2013   Procedure: RIGHT HEART CATH;  Surgeon: Mardell Shade, MD;  Location: [facility] CATH LAB;  Service: Cardiovascular;  Laterality: N/A;   SHOULDER ARTHROSCOPY WITH OPEN ROTATOR CUFF REPAIR Right 03/14/2014   Procedure: RIGHT SHOULDER ARTHROSCOPY WITH BICEPS RELEASE, OPEN SUBSCAPULA REPAIR, OPEN SUPRASPINATUS REPAIR.;  Surgeon: Jasmine Mesi, MD;  Location: [facility] OR;  Service: Orthopedics;  Laterality: Right;   SPHINCTEROTOMY  02/03/2022   Procedure: SPHINCTEROTOMY;  Surgeon: Alvis Jourdain, MD;  Location: [facility] ENDOSCOPY;  Service: Gastroenterology;;   STUMP REVISION Right 06/13/2022   Procedure: REVISION RIGHT BELOW KNEE AMPUTATION;  Surgeon: Timothy Ford, MD;  Location: [facility] OR;  Service: Orthopedics;  Laterality: Right;   TEE WITHOUT CARDIOVERSION N/A 02/04/2022   Procedure: TRANSESOPHAGEAL ECHOCARDIOGRAM (TEE);  Surgeon: Mardell Shade, MD;  Location: [facility] ENDOSCOPY;  Service: Cardiovascular;  Laterality: N/A;   THROMBECTOMY W/ EMBOLECTOMY Left 08/20/2022   Procedure: THROMBECTOMY OF LEFT ARM ARTERIOVENOUS  FISTULA;  Surgeon: Margherita Shell, MD;  Location: [facility] OR;  Service: Vascular;  Laterality: Left;   TOE AMPUTATION Right 02/24/2018   GREAT TOE AND 2ND TOE AMPUTATION   TUBAL LIGATION  1970's   UPPER EXTREMITY VENOGRAPHY N/A 07/31/2023   Procedure: UPPER EXTREMITY VENOGRAPHY;  Surgeon:  Carlene Che, MD;  [facility];  Service: Cardiovascular;  Laterality: N/A;      Social History:     Social History   Tobacco Use   Smoking status: Former    Current packs/day: 0.00    Average packs/day: 3.0 packs/day for 32.0 years (96.0 ttl pk-yrs)    Types: Cigarettes    Start date: 10/24/1965    Quit date: 10/24/1997    Years since quitting: 25.9   Smokeless tobacco: Never  Substance Use Topics   Alcohol use: Not Currently        Family History :     Family History  Problem Relation Age of Onset   Heart attack Mother 97      Home Medications:   Prior to Admission medications   Medication Sig Start Date End Date Taking? Authorizing Provider  acetaminophen  (TYLENOL ) 325 MG tablet Take 2 tablets (650 mg total) by mouth every 6 (six) hours as needed for mild pain (or Fever >/= 101). 12/10/22   Angiulli, Everlyn Hockey, PA-C  albuterol  (VENTOLIN  HFA) 108 (90 Base) MCG/ACT inhaler Inhale 1-2 puffs into the lungs every 6 (six) hours as needed for shortness of breath. 08/14/23   Austine Lefort, MD  amiodarone  (PACERONE ) 200 MG tablet Take 1 tablet (200 mg total) by mouth daily. 08/14/23   Austine Lefort, MD  apixaban  (ELIQUIS ) 5 MG TABS tablet Take 1 tablet (5 mg total) by mouth 2 (two) times daily. 08/14/23   Austine Lefort, MD  ascorbic acid  (VITAMIN C ) 500 MG tablet Take 1 tablet (500 mg total) by mouth daily. 12/11/22   Angiulli, Everlyn Hockey, PA-C  atorvastatin  (LIPITOR ) 80 MG tablet Take 1 tablet (80 mg total) by mouth daily. 08/14/23   Austine Lefort, MD  calcitRIOL  (ROCALTROL ) 0.25 MCG capsule Take 9 capsules (2.25 mcg total) by mouth Every Tuesday,Thursday,and Saturday  with dialysis. 08/15/23   Hoyt Macleod, MD  clonazePAM  (KLONOPIN ) 0.5 MG disintegrating tablet Take 1 tablet (0.5 mg total) by mouth 2 (two) times daily. Stop lorazepam . 08/28/23   Austine Lefort, MD  folic acid  (FOLVITE ) 1 MG tablet Take 1 mg by mouth daily. 04/08/23   [provider]  folic acid -vitamin b complex-vitamin c -selenium-zinc  (DIALYVITE) 3 MG TABS tablet Take 1 tablet by mouth daily.    [provider]  insulin  aspart (NOVOLOG ) 100 UNIT/ML injection Inject 0-15 Units into the skin 3 (three) times daily with meals. CBG < 70: treat low blood sugar, CBG 70 - 120: 0 units, CBG 121 - 150: 2 units, CBG 151 - 200: 3 units, CBG 201 - 250: 5 units, CBG 251 - 300: 8 units, CBG 301 - 350: 11 units, CBG 351 - 400: 15 units, CBG > 400: call MD 08/14/23   Austine Lefort, MD  insulin  glargine (LANTUS ) 100 UNIT/ML Solostar Pen Inject 31 Units into the skin every morning. 08/14/23   Austine Lefort, MD  lactulose, encephalopathy, (CHRONULAC) 10 GM/15ML SOLN Take 10 g by mouth daily as needed (constipation). 09/01/23   [provider]  levothyroxine  (SYNTHROID ) 50 MCG tablet Take 1 tablet (50 mcg total) by mouth daily before breakfast. 08/14/23   Austine Lefort, MD  melatonin 3 MG TABS tablet Take 3 mg by mouth at bedtime.    [provider]  methocarbamol  (ROBAXIN ) 750 MG tablet Take 1 tablet (750 mg total) by mouth every 6 (six) hours as needed for muscle spasms. 08/14/23   Eliane Grooms  T, MD  Methoxy PEG-Epoetin  Beta (MIRCERA IJ) Mircera 02/05/23 02/04/24  [provider]  midodrine  (PROAMATINE ) 10 MG tablet Take 1-2 tablets (10-20 mg total) by mouth Every Tuesday,Thursday,and Saturday with dialysis. 08/15/23   Austine Lefort, MD  nitroGLYCERIN  (NITROSTAT ) 0.4 MG SL tablet Place 1 tablet (0.4 mg total) under the tongue every 5 (five) minutes as needed. 08/14/23 11/12/23  Austine Lefort, MD  nystatin  cream (MYCOSTATIN ) Apply 1 Application topically 2  (two) times daily. 08/28/23   Austine Lefort, MD  oxyCODONE -acetaminophen  (PERCOCET/ROXICET) 5-325 MG tablet Take 1 tablet by mouth every 6 (six) hours as needed for moderate pain (pain score 4-6). 08/28/23   Austine Lefort, MD  OXYGEN  Inhale 2 L into the lungs at bedtime.    [provider]  pantoprazole  (PROTONIX ) 40 MG tablet Take 1 tablet (40 mg total) by mouth daily. 08/14/23   Austine Lefort, MD  polyethylene glycol (MIRALAX  / GLYCOLAX ) 17 g packet Take 17 g by mouth daily. Patient taking differently: Take 17 g by mouth daily as needed for mild constipation or moderate constipation. 12/10/22   Angiulli, Everlyn Hockey, PA-C  sevelamer  carbonate (RENVELA ) 800 MG tablet Take 1,600 mg by mouth 3 (three) times daily with meals.    [provider]  triamcinolone  cream (KENALOG ) 0.1 % Apply 1 Application topically 2 (two) times daily. Apply to affected areas BID Patient taking differently: Apply 1 Application topically at bedtime. 06/17/23   Amadeo June, MD  venlafaxine  XR (EFFEXOR -XR) 75 MG 24 hr capsule Take 1 capsule (75 mg total) by mouth daily with breakfast. Stop trintellix  08/14/23   Austine Lefort, MD  Zinc  30 MG CAPS Take 30 mg by mouth daily.    [provider]     Allergies:     Allergies  Allergen Reactions   Cephalexin  Diarrhea and Other (See Comments)   Codeine Nausea And Vomiting and Other (See Comments)     Physical Exam:   Vitals  Blood pressure (!) 93/49, pulse 87, temperature (!) 97.5 F (36.4 C), temperature source Oral, resp. rate 18, height 5\' 5"  (1.651 m), weight 74.2 kg, SpO2 95%.   1. General Elderly female, laying in bed, no apparent distress  2. Normal affect and insight, Not Suicidal or Homicidal, Awake Alert, Oriented X 3.  3. No F.N deficits, ALL C.Nerves Intact, Strength 5/5 all 4 extremities, Sensation intact all 4 extremities, Plantars down going.  4. Ears and Eyes appear Normal, Conjunctivae clear, PERRLA. Moist Oral  Mucosa.  5. Supple Neck, No JVD, No cervical lymphadenopathy appriciated, No Carotid Bruits.  6. Symmetrical Chest wall movement, Good air movement bilaterally, CTAB.  7. RRR, No Gallops, Rubs or Murmurs, No Parasternal Heave.  8. Positive Bowel Sounds, Abdomen Soft, No tenderness, No organomegaly appriciated,No rebound -guarding or rigidity.  9.  No Cyanosis, Normal Skin Turgor, No Skin Rash or Bruise.  10. Good muscle tone,  joints appear normal , no effusions, Normal ROM.  Right BKA, wearing right lower extremity prosthesis    Data Review:    CBC Recent Labs  Lab 10/01/23 1337  WBC 9.5  HGB 12.7  HCT 40.8  PLT 452*  MCV 89.3  MCH 27.8  MCHC 31.1  RDW 17.3*   ------------------------------------------------------------------------------------------------------------------  Chemistries  Recent Labs  Lab 10/01/23 1337  NA 128*  K 3.3*  CL 87*  CO2 25  GLUCOSE 163*  BUN 34*  CREATININE 4.37*  CALCIUM  9.6  AST 17  ALT 15  ALKPHOS 207*  BILITOT 1.0   ------------------------------------------------------------------------------------------------------------------ estimated creatinine clearance is 11.4 mL/min (A) (by C-G formula based on SCr of 4.37 mg/dL (H)). ------------------------------------------------------------------------------------------------------------------ No results for input(s): "TSH", "T4TOTAL", "T3FREE", "THYROIDAB" in the last 72 hours.  Invalid input(s): "FREET3"  Coagulation profile No results for input(s): "INR", "PROTIME" in the last 168 hours. ------------------------------------------------------------------------------------------------------------------- No results for input(s): "DDIMER" in the last 72 hours. -------------------------------------------------------------------------------------------------------------------  Cardiac Enzymes No results for input(s): "CKMB", "TROPONINI", "MYOGLOBIN" in the last 168  hours.  Invalid input(s): "CK" ------------------------------------------------------------------------------------------------------------------    Component Value Date/Time   BNP 1,142.6 (H) 02/25/2022 1051   BNP 814 (H) 08/13/2020 1242     ---------------------------------------------------------------------------------------------------------------  Urinalysis    Component Value Date/Time   COLORURINE AMBER (A) 10/01/2023 1505   APPEARANCEUR CLOUDY (A) 10/01/2023 1505   LABSPEC 1.011 10/01/2023 1505   PHURINE 5.0 10/01/2023 1505   GLUCOSEU NEGATIVE 10/01/2023 1505   HGBUR MODERATE (A) 10/01/2023 1505   BILIRUBINUR NEGATIVE 10/01/2023 1505   BILIRUBINUR negative 06/05/2020 1334   KETONESUR NEGATIVE 10/01/2023 1505   PROTEINUR 100 (A) 10/01/2023 1505   UROBILINOGEN 0.2 06/05/2020 1334   UROBILINOGEN 0.2 04/01/2014 1659   NITRITE NEGATIVE 10/01/2023 1505   LEUKOCYTESUR LARGE (A) 10/01/2023 1505    ----------------------------------------------------------------------------------------------------------------   Imaging Results:    CT ABDOMEN PELVIS W CONTRAST Result Date: 10/01/2023 CLINICAL DATA:  Altered mental status.  Weakness. EXAM: CT ABDOMEN AND PELVIS WITH CONTRAST TECHNIQUE: Multidetector CT imaging of the abdomen and pelvis was performed using the standard protocol following bolus administration of intravenous contrast. RADIATION DOSE REDUCTION: This exam was performed according to the departmental dose-optimization program which includes automated exposure control, adjustment of the mA and/or kV according to patient size and/or use of iterative reconstruction technique. CONTRAST:  OMNIPAQUE  IOHEXOL  300 MG/ML  SOLN COMPARISON:  August 09, 2023. FINDINGS: Lower chest: Small right pleural effusion is noted. Minimal bilateral posterior basilar subsegmental atelectasis is noted. Hepatobiliary: Cholelithiasis is noted. No biliary dilatation is noted. Multiple small  calcified granulomas are noted in the hepatic parenchyma. Pancreas: Unremarkable. No pancreatic ductal dilatation or surrounding inflammatory changes. Spleen: Multiple small calcified granulomas are noted. Adrenals/Urinary Tract: Adrenal glands appear normal. Bilateral renal cortical scarring is noted. Right renal cyst is noted. No hydronephrosis or renal obstruction is noted. Urinary bladder is decompressed. Stomach/Bowel: Stomach is unremarkable. There is no evidence of bowel obstruction. The appendix appears normal. Sigmoid diverticulosis is noted without inflammation. Moderate amount of stool seen in the rectum concerning for impaction. Vascular/Lymphatic: Aortic atherosclerosis. No enlarged abdominal or pelvic lymph nodes. Reproductive: Status post hysterectomy. No adnexal masses. Other: No ascites or hernia. Musculoskeletal: No acute or significant osseous findings. IMPRESSION: Small right pleural effusion. Cholelithiasis. Bilateral renal cortical scarring. Sigmoid diverticulosis without inflammation. Moderate amount of stool seen in rectum concerning for impaction. Aortic Atherosclerosis (ICD10-I70.0). Electronically Signed   By: Rosalene Colon M.D.   On: 10/01/2023 16:07   CT HEAD WO CONTRAST ( ) Result Date: 10/01/2023 CLINICAL DATA:  Delirium. EXAM: CT HEAD WITHOUT CONTRAST TECHNIQUE: Contiguous axial images were obtained from the base of the skull through the vertex without intravenous contrast. RADIATION DOSE REDUCTION: This exam was performed according to the departmental dose-optimization program which includes automated exposure control, adjustment of the mA and/or kV according to patient size and/or use of iterative reconstruction technique. COMPARISON:  Head CT 01/15/2022 FINDINGS: Brain: There is no evidence of an acute infarct, intracranial hemorrhage, mass, midline shift, or extra-axial fluid collection. There is mild cerebral atrophy.  Patchy cerebral white matter hypodensities have likely  mildly progressed and are nonspecific but compatible with moderate chronic small vessel ischemic disease. 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 Vascular: Calcified atherosclerosis at the skull base. No hyperdense vessel. Skull: No fracture or suspicious lesion. Sinuses/Orbits: Visualized paranasal sinuses and mastoid air cells are clear. Bilateral cataract extraction. Other: None. IMPRESSION: 1. No evidence of acute intracranial abnormality. 2. Moderate chronic small vessel ischemic disease. Electronically Signed   By: Aundra [name] M.D.   On: 10/01/2023 15:59   DG Chest Port 1 View Result Date: 10/01/2023 CLINICAL DATA:  Hypoxia. EXAM: PORTABLE CHEST 1 VIEW COMPARISON:  08/13/2023. FINDINGS: Stable cardiomegaly. Right IJ dialysis catheter tip overlies the right atrium, unchanged. Aortic atherosclerosis. Central pulmonary vascular congestion with bilateral interstitial prominence, most pronounced at the lung bases, which could reflect interstitial pulmonary edema. Possible small left pleural effusion. No pneumothorax. No acute osseous abnormality. IMPRESSION: 1. Cardiomegaly with central pulmonary vascular congestion and findings suggestive of interstitial pulmonary edema. 2. Possible small left pleural effusion. Electronically Signed   By: Mannie Seek M.D.   On: 10/01/2023 13:59      Assessment & Plan:    Principal Problem:   UTI (urinary tract infection) Active Problems:   Complicated UTI (urinary tract infection)   ESRD (end stage renal disease) (HCC)   Chronic hypotension   Hypothyroidism   Chronic HFrEF (heart failure with reduced ejection fraction) (HCC)   Atrial fibrillation (HCC)    UTI - Patient with known history of ESBL UTI and bacteremia in the past, she presents with dysuria, had leukocytosis - UA is positive. - Continue with meropenem , follow urine cultures and blood cultures    ESRD-HD-TTS -She missed her HD today as she was sent from dialysis center due  to her illness. - Location for emergency dialysis overnight, renal consult requested in epic to dialyze tomorrow. - Patient right subclavian hemodialysis catheter was changed out by IR 3/17 because of bacteremia during previous admission  Chronic hyponatremia -At baseline, management with dialysis  Hypokalemia -management with dialysis  Chronic hypotension Chronic systolic CHF -Continue with home midodrine  before dialysis days -No significant evidence of volume overload currently, so can wait for dialysis till tomorrow -Most recent echo from April 2024 with EF 30 to 35%, moderate to severe LV dysfunction   PAF  Heart rate controlled with amiodarone  Chronically anticoagulated with Eliquis     CAD, HLD Eliquis  and statin   Type 2 diabetes mellitus uncontrolled with hyperglycemia -Check A1c - Lantus  31 units daily, will resume at 22 units of Semglee  and will add insulin  sliding scale  Anemia of CKD Hemoglobin at baseline, management per renal  Hypothyroidism Continue Synthroid    Depression/anxiety Continue Klonopin  nightly, Ativan  nightly, melatonin nightly, Effexor  daily  Constipation -Will start on bowel regimen   DVT Prophylaxis on Eliquis   AM Labs Ordered, also please review Full Orders  Family Communication: Admission, patients condition and plan of care including tests being ordered have been discussed with the patient who indicate understanding and agree with the plan and Code Status.  Code Status Full  Likely DC to  home  Condition GUARDED    Consults called: renal requested in EPIC    Admission status: inpatient    Time spent in minutes : 75 minutes   Seena Dadds M.D on 10/01/2023 at 9:01 PM   [facility] - Office  [phone]

## 2023-10-01 NOTE — ED Notes (Signed)
 Lab with pt attempting to get blood samples.

## 2023-10-01 NOTE — ED Triage Notes (Signed)
 Pt arrived via [facility] from dialysis due to AMS. CBG 160's in route. Pt oriented to self, time, and place on arrival to ED. Pt states she is weak and unable to sleep for 2 days. Last dialysis completed Tuesday. Pt states she has new device on left arm for reading blood sugar that was placed Monday.

## 2023-10-01 NOTE — ED Provider Notes (Signed)
 [facility]-EMERGENCY DEPT [facility] Emergency Department Provider Note MRN:  [id number]  Arrival date & time: 10/01/23     Chief Complaint   Altered Mental Status   History of Present Illness   Susan [name] is a 73 y.o. year-old female with a history of CAD, CHF, ESRD, diabetes presenting to the ED with chief complaint of altered mental status.  Feeling unwell for the past 2 days with nausea, occasional vomiting.  Very poor appetite.  Lack of bowel movements.  Was feeling very sick but was determined to try to go to dialysis this morning.  Upon arrival they sent her here to the emergency department.  History obtained from patient's spouse.  Review of Systems  I was unable to obtain a full/accurate HPI, PMH, or ROS due to the patient's altered mental status.  Patient's Health History    Past Medical History:  Diagnosis Date   Anemia    hx   Anxiety    Arthritis    "generalized" (03/15/2014)   CAD (coronary artery disease)    MI in 2000 - MI  2007 - treated bare metal stent (no nuclear since then as 9/11)   Carotid artery disease (HCC)    Chronic diastolic heart failure (HCC)    a) ECHO (08/2013) EF 55-60% and RV function nl b) RHC (08/2013) RA 4, RV 30/5/7, PA 25/10 (16), PCWP 7, Fick CO/CI 6.3/2.7, PVR 1.5 WU, PA 61 and 66%   Daily headache    "~ every other day; since I fell in June" (03/15/2014)   Depression    Diabetic retinopathy (HCC)    Dyslipidemia    ESRD (end stage renal disease) (HCC)    Dialysis on Tues Thurs Sat   Exertional shortness of breath    GERD (gastroesophageal reflux disease)    History of blood transfusion    History of kidney stones    HTN (hypertension)    Hypothyroidism    Myocardial infarction (HCC)    Obesity    Osteoarthritis    PAF (paroxysmal atrial fibrillation) (HCC)    Peripheral neuropathy    bilateral feet/hands   PONV (postoperative nausea and vomiting)    RBBB (right bundle branch block)    Old   Stroke (HCC)    mini  strokes   Type II diabetes mellitus (HCC)    Type II, [name] libre left upper arm. patient has omnipod insulin  pump with Novolin R Insulin     Past Surgical History:  Procedure Laterality Date   A/V FISTULAGRAM Left 11/07/2022   Procedure: A/V Fistulagram;  Surgeon: Carlene Che, MD;  [facility] INV[facility] CV LAB;  Service: Cardiovascular;  Laterality: Left;   ABDOMINAL HYSTERECTOMY  1980's   AMPUTATION Right 02/24/2018   Procedure: RIGHT FOOT GREAT TOE AND 2ND TOE AMPUTATION;  Surgeon: Timothy Ford, MD;  Location: [facility] OR;  Service: Orthopedics;  Laterality: Right;   AMPUTATION Right 04/30/2018   Procedure: RIGHT TRANSMETATARSAL AMPUTATION;  Surgeon: Timothy Ford, MD;  Location: [facility] OR;  Service: Orthopedics;  Laterality: Right;   AMPUTATION Right 05/02/2022   Procedure: RIGHT BELOW KNEE AMPUTATION;  Surgeon: Timothy Ford, MD;  Location: [facility] OR;  Service: Orthopedics;  Laterality: Right;   APPLICATION OF WOUND VAC Right 06/13/2022   Procedure: APPLICATION OF WOUND VAC;  Surgeon: Timothy Ford, MD;  [facility]: [facility] OR;  Service: Orthopedics;  Laterality: Right;   APPLICATION OF WOUND VAC Left 11/14/2022   Procedure: APPLICATION OF WOUND VAC;  Surgeon: Gearldean Keepers  V, MD;  [facility]: [facility] OR;  Service: Orthopedics;  Laterality: Left;   AV FISTULA PLACEMENT Left 04/02/2022   Procedure: LEFT ARM ARTERIOVENOUS (AV) FISTULA CREATION;  Surgeon: Margherita Shell, MD;  [facility] OR;  Service: Vascular;  Laterality: Left;  PERIPHERAL NERVE BLOCK   AV FISTULA PLACEMENT Right 04/27/2023   Procedure: RIGHT ARM BRACHIOBASILIC ARTERIOVENOUS (AV) FISTULA CREATION;  Surgeon: Carlene Che, MD;  [facility] OR;  Service: Vascular;  Laterality: Right;   BASCILIC VEIN TRANSPOSITION Left 07/31/2022   Procedure: LEFT ARM SECOND STAGE BASILIC VEIN TRANSPOSITION;  Surgeon: Margherita Shell, MD;  [facility] OR;  Service: Vascular;  Laterality: Left;   BIOPSY  05/27/2020   Procedure: BIOPSY;  Surgeon: Vinetta Greening, DO;  [facility] ENDO [facility];  Service: Endoscopy;;   CATARACT EXTRACTION, BILATERAL Bilateral ?2013   COLONOSCOPY W/ POLYPECTOMY     COLONOSCOPY WITH PROPOFOL  N/A 03/13/2019   Procedure: COLONOSCOPY WITH PROPOFOL ;  Surgeon: Nannette Babe, MD;  Location: [facility] ENDOSCOPY;  Service: Gastroenterology;  Laterality: N/A;   CORONARY ANGIOPLASTY WITH STENT PLACEMENT  1999; 2007   "1 + 1"   ERCP N/A 02/03/2022   Procedure: ENDOSCOPIC RETROGRADE CHOLANGIOPANCREATOGRAPHY (ERCP);  Surgeon: Alvis Jourdain, MD;  Location: [facility] ENDOSCOPY;  Service: Gastroenterology;  Laterality: N/A;   ESOPHAGOGASTRODUODENOSCOPY N/A 02/12/2023   Procedure: ESOPHAGOGASTRODUODENOSCOPY (EGD);  Surgeon: Alvis Jourdain, MD;  Location: [facility] ENDOSCOPY;  Service: Gastroenterology;  Laterality: N/A;   ESOPHAGOGASTRODUODENOSCOPY (EGD) WITH PROPOFOL  N/A 03/13/2019   Procedure: ESOPHAGOGASTRODUODENOSCOPY (EGD) WITH PROPOFOL ;  Surgeon: Nannette Babe, MD;  Location: [facility] ENDOSCOPY;  Service: Gastroenterology;  Laterality: N/A;   ESOPHAGOGASTRODUODENOSCOPY (EGD) WITH PROPOFOL  N/A 05/27/2020   Procedure: ESOPHAGOGASTRODUODENOSCOPY (EGD) WITH PROPOFOL ;  Surgeon: Vinetta Greening, DO;  [facility];  Service: Endoscopy;  Laterality: N/A;   ESOPHAGOGASTRODUODENOSCOPY (EGD) WITH PROPOFOL  N/A 09/03/2022   Procedure: ESOPHAGOGASTRODUODENOSCOPY (EGD) WITH PROPOFOL ;  Surgeon: Alvis Jourdain, MD;  [facility] END[facility]COPY;  Service: Gastroenterology;  Laterality: N/A;   EYE SURGERY Bilateral    lazer   FLEXIBLE SIGMOIDOSCOPY N/A 05/23/2022   Procedure: FLEXIBLE SIGMOIDOSCOPY;  Surgeon: Alvis Jourdain, MD;  Location: [facility] ENDOSCOPY;  Service: Gastroenterology;  Laterality: N/A;   FLEXIBLE SIGMOIDOSCOPY N/A 05/24/2022   Procedure: FLEXIBLE SIGMOIDOSCOPY;  Surgeon: Daina Drum, MD;  Location: [facility] ENDOSCOPY;  Service: Gastroenterology;  Laterality: N/A;   FLEXIBLE SIGMOIDOSCOPY N/A 09/03/2022   Procedure: FLEXIBLE SIGMOIDOSCOPY;  Surgeon: Alvis Jourdain, MD;  Location: [facility] ENDOSCOPY;  Service: Gastroenterology;  Laterality: N/A;   HEMOSTASIS CLIP PLACEMENT  03/13/2019   Procedure: HEMOSTASIS CLIP PLACEMENT;  Surgeon: Nannette Babe, MD;  Location: [facility] ENDOSCOPY;  Service: Gastroenterology;;   HEMOSTASIS CLIP PLACEMENT  05/23/2022   Procedure: HEMOSTASIS CLIP PLACEMENT;  Surgeon: Alvis Jourdain, MD;  Location: [facility] ENDOSCOPY;  Service: Gastroenterology;;   HEMOSTASIS CONTROL  05/24/2022   Procedure: HEMOSTASIS CONTROL;  Surgeon: Daina Drum, MD;  Location: [facility] ENDOSCOPY;  Service: Gastroenterology;;   HOT HEMOSTASIS N/A 05/23/2022   Procedure: HOT HEMOSTASIS (ARGON PLASMA COAGULATION/BICAP);  Surgeon: Alvis Jourdain, MD;  Location: [facility] ENDOSCOPY;  Service: Gastroenterology;  Laterality: N/A;   I & D EXTREMITY Left 05/05/2022   Procedure: IRRIGATION AND DEBRIDEMENT LEFT ARM AV FISTULA;  Surgeon: Young Hensen, MD;  Location: [facility] OR;  Service: Vascular;  Laterality: Left;   I & D EXTREMITY N/A 11/14/2022   Procedure: IRRIGATION AND DEBRIDEMENT OF LOWER EXTREMITY WOUND;  Surgeon: Timothy Ford, MD;  [facility] OR;  Service: Orthopedics;  Laterality: N/A;   INSERTION OF DIALYSIS CATHETER Right 04/02/2022   Procedure: INSERTION OF TUNNELED DIALYSIS CATHETER;  Surgeon: Margherita Shell, MD;  [facility] OR;  Service: Vascular;  Laterality: Right;   IR FLUORO GUIDE CV LINE RIGHT  08/10/2023   IR VENOCAVAGRAM IVC  08/10/2023   KNEE ARTHROSCOPY Left 10/25/2006   POLYPECTOMY  03/13/2019   Procedure: POLYPECTOMY;  Surgeon: Nannette Babe, MD;  Location: [facility] ENDOSCOPY;  Service: Gastroenterology;;   REMOVAL OF STONES  02/03/2022   Procedure: REMOVAL OF STONES;  Surgeon: Alvis Jourdain, MD;  Location: [facility] ENDOSCOPY;  Service: Gastroenterology;;   REVISON OF ARTERIOVENOUS FISTULA Left 08/20/2022   Procedure: REVISON OF LEFT ARM ARTERIOVENOUS FISTULA;  Surgeon: Margherita Shell, MD;  [facility];  Service: Vascular;  Laterality: Left;   RIGHT HEART  CATH N/A 07/24/2017   Procedure: RIGHT HEART CATH;  Surgeon: Mardell Shade, MD;  [facility];  Service: Cardiovascular;  Laterality: N/A;   RIGHT HEART CATHETERIZATION N/A 09/22/2013   Procedure: RIGHT HEART CATH;  Surgeon: Mardell Shade, MD;  Location: [facility] CATH LAB;  Service: Cardiovascular;  Laterality: N/A;   SHOULDER ARTHROSCOPY WITH OPEN ROTATOR CUFF REPAIR Right 03/14/2014   Procedure: RIGHT SHOULDER ARTHROSCOPY WITH BICEPS RELEASE, OPEN SUBSCAPULA REPAIR, OPEN SUPRASPINATUS REPAIR.;  Surgeon: Jasmine Mesi, MD;  Location: [facility] OR;  Service: Orthopedics;  Laterality: Right;   SPHINCTEROTOMY  02/03/2022   Procedure: SPHINCTEROTOMY;  Surgeon: Alvis Jourdain, MD;  Location: [facility] ENDOSCOPY;  Service: Gastroenterology;;   STUMP REVISION Right 06/13/2022   Procedure: REVISION RIGHT BELOW KNEE AMPUTATION;  Surgeon: Timothy Ford, MD;  Location: [facility] OR;  Service: Orthopedics;  Laterality: Right;   TEE WITHOUT CARDIOVERSION N/A 02/04/2022   Procedure: TRANSESOPHAGEAL ECHOCARDIOGRAM (TEE);  Surgeon: Mardell Shade, MD;  Location: [facility] ENDOSCOPY;  Service: Cardiovascular;  Laterality: N/A;   THROMBECTOMY W/ EMBOLECTOMY Left 08/20/2022   Procedure: THROMBECTOMY OF LEFT ARM ARTERIOVENOUS FISTULA;  Surgeon: Margherita Shell, MD;  Location: [facility] OR;  Service: Vascular;  Laterality: Left;   TOE AMPUTATION Right 02/24/2018   GREAT TOE AND 2ND TOE AMPUTATION   TUBAL LIGATION  1970's   UPPER EXTREMITY VENOGRAPHY N/A 07/31/2023   Procedure: UPPER EXTREMITY VENOGRAPHY;  Surgeon: Carlene Che, MD;  [facility];  Service: Cardiovascular;  Laterality: N/A;    Family History  Problem Relation Age of Onset   Heart attack Mother 59    Social History   Socioeconomic History   Marital status: Married    Spouse name: Gaylin Ke   Number of children: 3   Years of education: 12th   Highest education level: Not on file  Occupational History    Employer: UNEMPLOYED  Tobacco  Use   Smoking status: Former    Current packs/day: 0.00    Average packs/day: 3.0 packs/day for 32.0 years (96.0 ttl pk-yrs)    Types: Cigarettes    Start date: 10/24/1965    Quit date: 10/24/1997    Years since quitting: 25.9   Smokeless tobacco: Never  Vaping Use   Vaping status: Never Used  Substance and Sexual Activity   Alcohol use: Not Currently   Drug use: No   Sexual activity: Not Currently    Birth control/protection: Surgical    Comment: Hysterectomy  Other Topics Concern   Not on file  Social History Narrative   Pt lives at home with her spouse.Caffeine Use- 3 sodas daily.    Social Drivers of  Health   Financial Resource Strain: Low Risk  (08/16/2021)   Overall Financial Resource Strain (CARDIA)    Difficulty of Paying Living Expenses: Not hard at all  Food Insecurity: No Food Insecurity (08/06/2023)   Hunger Vital Sign    Worried About Running Out of Food in the Last Year: Never true    Ran Out of Food in the Last Year: Never true  Transportation Needs: No Transportation Needs (08/06/2023)   PRAPARE - Administrator, Civil Service (Medical): No    Lack of Transportation (Non-Medical): No  Physical Activity: Inactive (08/16/2021)   Exercise Vital Sign    Days of Exercise per Week: 0 days    Minutes of Exercise per Session: 0 min  Stress: No Stress Concern Present (08/16/2021)   [employer] of Occupational Health - Occupational Stress Questionnaire    Feeling of Stress : Not at all  Social Connections: Moderately Isolated (08/06/2023)   Social Connection and Isolation Panel [NHANES]    Frequency of Communication with Friends and Family: More than three times a week    Frequency of Social Gatherings with Friends and Family: More than three times a week    Attends Religious Services: Never    Database administrator or Organizations: No    Attends Banker Meetings: Never    Marital Status: Married  Catering manager Violence: Not At Risk  (08/06/2023)   Humiliation, Afraid, Rape, and Kick questionnaire    Fear of Current or Ex-Partner: No    Emotionally Abused: No    Physically Abused: No    Sexually Abused: No     Physical Exam   Vitals:   10/01/23 1430 10/01/23 1705  BP: (!) 156/65 (!) 150/76  Pulse: 77 79  Resp: 16 16  Temp:  (!) 97.4 F (36.3 C)  SpO2: 92% 95%    CONSTITUTIONAL: Chronically ill-appearing, NAD NEURO/PSYCH: Somnolent, somewhat responsive to voice EYES:  eyes equal and reactive ENT/NECK:  no LAD, no JVD CARDIO: Regular rate, well-perfused, normal S1 and S2 PULM:  CTAB no wheezing or rhonchi GI/GU:  non-distended, non-tender MSK/SPINE:  No gross deformities, no edema SKIN:  no rash, atraumatic   *Additional and/or pertinent findings included in MDM below  Diagnostic and Interventional Summary    EKG Interpretation Date/Time:  Thursday Oct 01 2023 12:08:12 EDT Ventricular Rate:  76 PR Interval:  186 QRS Duration:  160 QT Interval:  478 QTC Calculation: 538 R Axis:   -82  Text Interpretation: Sinus rhythm Nonspecific IVCD with LAD Left ventricular hypertrophy Anterior infarct, old Confirmed by Gwenetta Lennert [phone]) on 10/01/2023 1:04:41 PM       Labs Reviewed  CBC - Abnormal; Notable for the following components:      Result Value   RDW 17.3 (*)    Platelets 452 (*)    All other components within normal limits  COMPREHENSIVE METABOLIC PANEL WITH GFR - Abnormal; Notable for the following components:   Sodium 128 (*)    Potassium 3.3 (*)    Chloride 87 (*)    Glucose, Bld 163 (*)    BUN 34 (*)    Creatinine, Ser 4.37 (*)    Albumin  3.1 (*)    Alkaline Phosphatase 207 (*)    GFR, Estimated 10 (*)    Anion gap 16 (*)    All other components within normal limits  URINALYSIS, ROUTINE W REFLEX MICROSCOPIC - Abnormal; Notable for the following components:   Color, Urine AMBER (*)  APPearance CLOUDY (*)    Hgb urine dipstick MODERATE (*)    Protein, ur 100 (*)     Leukocytes,Ua LARGE (*)    Bacteria, UA MANY (*)    All other components within normal limits  BLOOD GAS, VENOUS - Abnormal; Notable for the following components:   pH, Ven 7.44 (*)    pO2, Ven <31 (*)    Bicarbonate 34.5 (*)    Acid-Base Excess 8.5 (*)    All other components within normal limits  CULTURE, BLOOD (ROUTINE X 2)  CULTURE, BLOOD (ROUTINE X 2)  I-STAT CHEM 8, ED    CT HEAD WO CONTRAST ( )  Final Result    CT ABDOMEN PELVIS W CONTRAST  Final Result    DG Chest Port 1 View  Final Result      Medications  meropenem  (MERREM ) 1 g in sodium chloride  0.9 % 100 mL IVPB (has no administration in time range)  iohexol  (OMNIPAQUE ) 300 MG/ML solution 100 mL (100 mLs Intravenous Contrast Given 10/01/23 1524)  LORazepam  (ATIVAN ) injection 1 mg (1 mg Intravenous Given 10/01/23 1701)     Procedures  /  Critical Care Procedures  ED Course and Medical Decision Making  Initial Impression and Ddx Differential diagnosis includes SBO, electrolyte disturbance, underlying infection such as UTI, gastroenteritis, missed dialysis and so critical hyperkalemia is also considered.  CNS mass or lesion or bleeding considered as well.  Past medical/surgical history that increases complexity of ED encounter: ESRD, CHF  Interpretation of Diagnostics I personally reviewed the Laboratory Testing and my interpretation is as follows: Hyponatremia, evidence of urinary tract infection with culture data showing a past history of ESBL  CT imaging without acute process  Patient Reassessment and Ultimate Disposition/Management     Patient slightly more awake, still not her baseline, will admit for ultimate status, likely ESBL UTI.  Patient management required discussion with the following services or consulting groups:  Hospitalist Service  Complexity of Problems Addressed Acute illness or injury that poses threat of life of bodily function  Additional Data Reviewed and Analyzed Further history  obtained from: Further history from spouse/family member  Additional Factors Impacting ED Encounter Risk Consideration of hospitalization  Merrick Abe. Harless Lien, MD [facility] Emergency Medicine [facility] [email or web address]  Final Clinical Impressions(s) / ED Diagnoses     ICD-10-CM   1. UTI due to extended-spectrum beta lactamase (ESBL) producing Escherichia coli  N39.0    B96.29    Z16.12       ED Discharge Orders     None        Discharge Instructions Discussed with and Provided to Patient:   Discharge Instructions   None      Edson Graces, MD 10/01/23 1718

## 2023-10-02 DIAGNOSIS — N3001 Acute cystitis with hematuria: Secondary | ICD-10-CM | POA: Diagnosis not present

## 2023-10-02 DIAGNOSIS — I482 Chronic atrial fibrillation, unspecified: Secondary | ICD-10-CM | POA: Diagnosis not present

## 2023-10-02 DIAGNOSIS — I10 Essential (primary) hypertension: Secondary | ICD-10-CM

## 2023-10-02 DIAGNOSIS — N186 End stage renal disease: Secondary | ICD-10-CM | POA: Diagnosis not present

## 2023-10-02 LAB — BASIC METABOLIC PANEL WITH GFR
Anion gap: 23 — ABNORMAL HIGH (ref 5–15)
BUN: 44 mg/dL — ABNORMAL HIGH (ref 8–23)
CO2: 19 mmol/L — ABNORMAL LOW (ref 22–32)
Calcium: 8.6 mg/dL — ABNORMAL LOW (ref 8.9–10.3)
Chloride: 90 mmol/L — ABNORMAL LOW (ref 98–111)
Creatinine, Ser: 4.89 mg/dL — ABNORMAL HIGH (ref 0.44–1.00)
GFR, Estimated: 9 mL/min — ABNORMAL LOW (ref >60)
Glucose, Bld: 220 mg/dL — ABNORMAL HIGH (ref 70–99)
Potassium: 3.7 mmol/L (ref 3.5–5.1)
Sodium: 132 mmol/L — ABNORMAL LOW (ref 135–145)

## 2023-10-02 LAB — MRSA NEXT GEN BY PCR, NASAL: MRSA by PCR Next Gen: NOT DETECTED

## 2023-10-02 LAB — GLUCOSE, CAPILLARY
Glucose-Capillary: 213 mg/dL — ABNORMAL HIGH (ref 70–99)
Glucose-Capillary: 249 mg/dL — ABNORMAL HIGH (ref 70–99)
Glucose-Capillary: 257 mg/dL — ABNORMAL HIGH (ref 70–99)
Glucose-Capillary: 281 mg/dL — ABNORMAL HIGH (ref 70–99)
Glucose-Capillary: 301 mg/dL — ABNORMAL HIGH (ref 70–99)

## 2023-10-02 LAB — CBC
HCT: 33.6 % — ABNORMAL LOW (ref 36.0–46.0)
Hemoglobin: 9.7 g/dL — ABNORMAL LOW (ref 12.0–15.0)
MCH: 26.8 pg (ref 26.0–34.0)
MCHC: 28.9 g/dL — ABNORMAL LOW (ref 30.0–36.0)
MCV: 92.8 fL (ref 80.0–100.0)
Platelets: 365 10*3/uL (ref 150–400)
RBC: 3.62 MIL/uL — ABNORMAL LOW (ref 3.87–5.11)
RDW: 17.2 % — ABNORMAL HIGH (ref 11.5–15.5)
WBC: 8.3 10*3/uL (ref 4.0–10.5)
nRBC: 0 % (ref 0.0–0.2)

## 2023-10-02 LAB — HEMOGLOBIN A1C
Hgb A1c MFr Bld: 6.8 % — ABNORMAL HIGH (ref 4.8–5.6)
Mean Plasma Glucose: 148.46 mg/dL

## 2023-10-02 MED ORDER — PROCHLORPERAZINE EDISYLATE 10 MG/2ML IJ SOLN
10.0000 mg | INTRAMUSCULAR | Status: DC | PRN
  Administered 2023-10-02 – 2023-10-07 (×4): 10 mg via INTRAVENOUS
  Filled 2023-10-02 (×4): qty 2

## 2023-10-02 MED ORDER — CHLORHEXIDINE GLUCONATE CLOTH 2 % EX PADS
6.0000 | MEDICATED_PAD | Freq: Every day | CUTANEOUS | Status: DC
  Administered 2023-10-02 – 2023-10-09 (×8): 6 via TOPICAL

## 2023-10-02 MED ORDER — INSULIN ASPART 100 UNIT/ML IJ SOLN
6.0000 [IU] | Freq: Three times a day (TID) | INTRAMUSCULAR | Status: DC
  Administered 2023-10-02 (×2): 6 [IU] via SUBCUTANEOUS

## 2023-10-02 MED ORDER — LORAZEPAM 0.5 MG PO TABS
0.5000 mg | ORAL_TABLET | Freq: Four times a day (QID) | ORAL | Status: DC | PRN
  Administered 2023-10-02: 0.5 mg via ORAL
  Filled 2023-10-02: qty 1

## 2023-10-02 MED ORDER — MILK AND MOLASSES ENEMA
1.0000 | Freq: Once | RECTAL | Status: AC
  Administered 2023-10-02: 240 mL via RECTAL

## 2023-10-02 NOTE — Progress Notes (Signed)
 OT Cancellation Note  Patient Details Name: Susan [name] MRN: [id number] DOB: 1951-01-05   Cancelled Treatment:    Reason Eval/Treat Not Completed: Medical issues which prohibited therapy. Pt reports being too nauseous to participate in therapy evaluation today. Educated on need to move to not get weak, but pt reiterated that she could not. Educated to try and have husband assist her with mobility over the weekend since husband assist with transfers at baseline. Will attempt evaluation later as time permits.   Falisa Lamora OT, MOT   [name] 10/02/2023, 10:46 AM

## 2023-10-02 NOTE — Progress Notes (Signed)
 PROGRESS NOTE   Susan [name]  ZOX:[id number] DOB: 13-Sep-1950 DOA: 10/01/2023 PCP: Austine Lefort, MD   Chief Complaint  Patient presents with   Altered Mental Status   Level of care: Med-Surg  Brief Admission History:  73 y.o. female, with PMH significant for ESRD-HD-TTS, PAF on Eliquis , CAD status post PCI, chronic systolic CHF with EF 30-35%, DM2, HTN, HLD, hypothyroidism, anemia of CKD, right BKA, and depression/anxiety with recent hospitalization on 3/12, ESBL UTI and bacteremia.  The right subclavian HD catheter exchanged during that admission.   -Pt was sent from HD center for evaluation, patient reports she has not been feeling well, having nausea, occasional vomiting, poor appetite, constipation, as well she does endorse dysuria, was found to be very sick at dialysis center, so was sent to ED for evaluation. - In ED, her UA was positive, she does endorse dysuria, was positive, no leukocytosis, afebrile, sodium was low at 128, potassium at 3.3, CT abdomen pelvis with diverticulosis, and constipation, treated hospitalist consulted to admit.   Assessment and Plan:  UTI - Patient with known history of ESBL UTI and bacteremia in the past, she presents with dysuria, had leukocytosis - UA is positive - Continue with meropenem , follow cultures    ESRD-HD-TTS -She missed her HD today as she was sent from dialysis center due to her illness. - Location for emergency dialysis overnight, renal consult requested in epic to dialyze tomorrow. - Patient right subclavian hemodialysis catheter was changed out by IR 3/17 because of bacteremia during previous admission   Chronic hyponatremia -At baseline, management with dialysis   Hypokalemia -management with dialysis   Chronic hypotension Chronic systolic CHF -Continue with home midodrine  before dialysis days -No significant evidence of volume overload currently, so can wait for dialysis till tomorrow -Most recent echo from April 2024  with EF 30 to 35%, moderate to severe LV dysfunction   PAF  Heart rate controlled with amiodarone  Chronically anticoagulated with Eliquis     CAD, HLD Eliquis  and statin   Type 2 diabetes mellitus uncontrolled with hyperglycemia - Check A1c - Lantus  31 units daily, will resume at 22 units of Semglee  and will add insulin  sliding scale  CBG (last 3)  Recent Labs    10/02/23 0053 10/02/23 0738 10/02/23 1124  GLUCAP 213* 249* 281*   Anemia of CKD Hemoglobin at baseline, management per renal   Hypothyroidism Continue Synthroid    Depression/anxiety Continue Klonopin  nightly, Ativan  nightly, melatonin nightly, Effexor  daily   Constipation concern for fecal impaction -started on bowel regimen -added molasses enema treatment    DVT prophylaxis: apixaban   Code Status: Full  Family Communication:  Disposition: anticipate DC home tomorrow after HD   Consultants:  Nephrology   Procedures:   Antimicrobials:  Meropenem  5/8>>   Subjective: Pt reports that she was worried about this UTI given the last one she had was pretty severe.  She denies nausea or vomiting.    Objective: Vitals:   10/01/23 1956 10/02/23 0023 10/02/23 0437 10/02/23 0800  BP: (!) 93/49 (!) 91/40 (!) 117/55 (!) 111/48  Pulse: 87 74 86 79  Resp: 18 19 18 18   Temp: (!) 97.5 F (36.4 C) 98.1 F (36.7 C) 97.7 F (36.5 C)   TempSrc: Oral  Oral   SpO2: 95% 100% 100% 100%  Weight: 74.2 kg     Height: 5\' 5"  (1.651 m)       Intake/Output Summary (Last 24 hours) at 10/02/2023 1128 Last data filed at 10/01/2023 8119 Gross  per 24 hour  Intake 100 ml  Output --  Net 100 ml   Filed Weights   10/01/23 1205 10/01/23 1956  Weight: 104.3 kg 74.2 kg   Examination:  General exam: Appears chronically ill; calm and comfortable  Respiratory system: Clear to auscultation. Respiratory effort normal. Cardiovascular system: normal S1 & S2 heard. No JVD, murmurs, rubs, gallops or clicks. No pedal  edema. Gastrointestinal system: Abdomen is nondistended, soft and nontender. No organomegaly or masses felt. Normal bowel sounds heard. Central nervous system: Alert and oriented. No focal neurological deficits. Extremities: Symmetric 5 x 5 power. Skin: No rashes, lesions or ulcers. Psychiatry: Judgement and insight appear normal. Mood & affect appropriate.   Data Reviewed: I have personally reviewed following labs and imaging studies  CBC: Recent Labs  Lab 10/01/23 1337 10/02/23 0356  WBC 9.5 8.3  HGB 12.7 9.7*  HCT 40.8 33.6*  MCV 89.3 92.8  PLT 452* 365    Basic Metabolic Panel: Recent Labs  Lab 10/01/23 1337 10/02/23 0356  NA 128* 132*  K 3.3* 3.7  CL 87* 90*  CO2 25 19*  GLUCOSE 163* 220*  BUN 34* 44*  CREATININE 4.37* 4.89*  CALCIUM  9.6 8.6*    CBG: Recent Labs  Lab 10/02/23 0053 10/02/23 0738 10/02/23 1124  GLUCAP 213* 249* 281*    Recent Results (from the past 240 hours)  Culture, blood (Routine X 2) w Reflex to ID Panel     Status: None (Preliminary result)   Collection Time: 10/01/23  2:54 PM   Specimen: BLOOD  Result Value Ref Range Status   Specimen Description BLOOD BLOOD LEFT ARM  Final   Special Requests   Final    BOTTLES DRAWN AEROBIC ONLY Blood Culture results may not be optimal due to an inadequate volume of blood received in culture bottles   Culture   Final    NO GROWTH < 24 HOURS Performed at [facility], [address] [place], [place] [address]    Report Status PENDING  Incomplete  Culture, blood (Routine X 2) w Reflex to ID Panel     Status: None (Preliminary result)   Collection Time: 10/01/23  2:54 PM   Specimen: BLOOD  Result Value Ref Range Status   Specimen Description BLOOD BLOOD RIGHT ARM  Final   Special Requests   Final    BOTTLES DRAWN AEROBIC ONLY Blood Culture results may not be optimal due to an inadequate volume of blood received in culture bottles   Culture   Final    NO GROWTH < 24 HOURS Performed at [facility], [address] [place], [place] [address]    Report Status PENDING  Incomplete  MRSA Next Gen by PCR, Nasal     Status: None   Collection Time: 10/02/23  3:52 AM   Specimen: Nasal Mucosa; Nasal Swab  Result Value Ref Range Status   MRSA by PCR Next Gen NOT DETECTED NOT DETECTED Final    Comment: (NOTE) The GeneXpert MRSA Assay (FDA approved for NASAL specimens only), is one component of a comprehensive MRSA colonization surveillance program. It is not intended to diagnose MRSA infection nor to guide or monitor treatment for MRSA infections. Test performance is not FDA approved in patients less than 74 years old. Performed at [facility], [address] [place], [place] [address]      Radiology Studies: CT ABDOMEN PELVIS W CONTRAST Result Date: 10/01/2023 CLINICAL DATA:  Altered mental status.  Weakness. EXAM: CT ABDOMEN AND  PELVIS WITH CONTRAST TECHNIQUE: Multidetector CT imaging of the abdomen and pelvis was performed using the standard protocol following bolus administration of intravenous contrast. RADIATION DOSE REDUCTION: This exam was performed according to the departmental dose-optimization program which includes automated exposure control, adjustment of the mA and/or kV according to patient size and/or use of iterative reconstruction technique. CONTRAST:  OMNIPAQUE  IOHEXOL  300 MG/ML  SOLN COMPARISON:  August 09, 2023. FINDINGS: Lower chest: Small right pleural effusion is noted. Minimal bilateral posterior basilar subsegmental atelectasis is noted. Hepatobiliary: Cholelithiasis is noted. No biliary dilatation is noted. Multiple small calcified granulomas are noted in the hepatic parenchyma. Pancreas: Unremarkable. No pancreatic ductal dilatation or surrounding inflammatory changes. Spleen: Multiple small calcified granulomas are noted. Adrenals/Urinary Tract: Adrenal glands appear normal. Bilateral renal cortical scarring is noted. Right renal cyst is noted. No hydronephrosis  or renal obstruction is noted. Urinary bladder is decompressed. Stomach/Bowel: Stomach is unremarkable. There is no evidence of bowel obstruction. The appendix appears normal. Sigmoid diverticulosis is noted without inflammation. Moderate amount of stool seen in the rectum concerning for impaction. Vascular/Lymphatic: Aortic atherosclerosis. No enlarged abdominal or pelvic lymph nodes. Reproductive: Status post hysterectomy. No adnexal masses. Other: No ascites or hernia. Musculoskeletal: No acute or significant osseous findings. IMPRESSION: Small right pleural effusion. Cholelithiasis. Bilateral renal cortical scarring. Sigmoid diverticulosis without inflammation. Moderate amount of stool seen in rectum concerning for impaction. Aortic Atherosclerosis (ICD10-I70.0). Electronically Signed   By: Rosalene Colon M.D.   On: 10/01/2023 16:07   CT HEAD WO CONTRAST ( ) Result Date: 10/01/2023 CLINICAL DATA:  Delirium. EXAM: CT HEAD WITHOUT CONTRAST TECHNIQUE: Contiguous axial images were obtained from the base of the skull through the vertex without intravenous contrast. RADIATION DOSE REDUCTION: This exam was performed according to the departmental dose-optimization program which includes automated exposure control, adjustment of the mA and/or kV according to patient size and/or use of iterative reconstruction technique. COMPARISON:  Head CT 01/15/2022 FINDINGS: Brain: There is no evidence of an acute infarct, intracranial hemorrhage, mass, midline shift, or extra-axial fluid collection. There is mild cerebral atrophy. Patchy cerebral white matter hypodensities have likely mildly progressed and are nonspecific but compatible with moderate chronic small vessel ischemic disease. 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 Vascular: Calcified atherosclerosis at the skull base. No hyperdense vessel. Skull: No fracture or suspicious lesion. Sinuses/Orbits: Visualized paranasal sinuses and mastoid air cells are  clear. Bilateral cataract extraction. Other: None. IMPRESSION: 1. No evidence of acute intracranial abnormality. 2. Moderate chronic small vessel ischemic disease. Electronically Signed   By: Aundra [name] M.D.   On: 10/01/2023 15:59   DG Chest Port 1 View Result Date: 10/01/2023 CLINICAL DATA:  Hypoxia. EXAM: PORTABLE CHEST 1 VIEW COMPARISON:  08/13/2023. FINDINGS: Stable cardiomegaly. Right IJ dialysis catheter tip overlies the right atrium, unchanged. Aortic atherosclerosis. Central pulmonary vascular congestion with bilateral interstitial prominence, most pronounced at the lung bases, which could reflect interstitial pulmonary edema. Possible small left pleural effusion. No pneumothorax. No acute osseous abnormality. IMPRESSION: 1. Cardiomegaly with central pulmonary vascular congestion and findings suggestive of interstitial pulmonary edema. 2. Possible small left pleural effusion. Electronically Signed   By: Mannie Seek M.D.   On: 10/01/2023 13:59    Scheduled Meds:  amiodarone   200 mg Oral Daily   apixaban   5 mg Oral BID   atorvastatin   80 mg Oral Daily   [START ON 10/03/2023] calcitRIOL   2.25 mcg Oral Q T,Th,Sa-HD   Chlorhexidine  Gluconate Cloth  6 each Topical Q0600  clonazePAM   0.5 mg Oral BID   folic acid   1 mg Oral Daily   insulin  aspart  0-6 Units Subcutaneous TID WC   insulin  glargine-yfgn  22 Units Subcutaneous Daily   levothyroxine   50 mcg Oral QAC breakfast   melatonin  3 mg Oral QHS   [START ON 10/03/2023] midodrine   10-20 mg Oral Q T,Th,Sa-HD   pantoprazole   40 mg Oral Daily   polyethylene glycol  17 g Oral Daily   sevelamer  carbonate  1,600 mg Oral TID WC   venlafaxine  XR  75 mg Oral Q breakfast   Continuous Infusions:  meropenem  (MERREM ) IV       LOS: 1 day   Time spent: 55 mins  Adedamola Seto Lincoln Renshaw, MD How to contact the [facility] Attending or Consulting provider 7A - 7P or covering provider during after hours 7P -7A, for this patient?  Check the care team in [facility] and  look for a) attending/consulting TRH provider listed and b) the TRH team listed Log into [email or web address] to find provider on call.  Locate the TRH provider you are looking for under [facility] and page to a number that you can be directly reached. If you still have difficulty reaching the provider, please page the [facility] (Director on Call) for the Hospitalists listed on amion for assistance.  10/02/2023, 11:28 AM

## 2023-10-02 NOTE — TOC Initial Note (Signed)
 Transition of Care [facility]) - Initial/Assessment Note    Patient Details  Name: Susan [name] MRN: [id number] Date of Birth: Jun 08, 1950  [facility] CM/SW Contact:    Linnea Richards, LCSW Phone Number: 10/02/2023, 12:46 PM  Clinical Narrative:                  Pt admitted from home. She has a high readmission risk score. Met with pt and her husband at bedside to assess.  Pt's husband is her caregiver at home. Pt has a BKA/prosthetic and uses a wheelchair for transport. She is able to ambulate short distances with her walker. Pt has a hospital bed and she has home O2 for use at night (2L) from [employer]. Pt's husband has a wheelchair Carloyn Chi and is able to transport pt to appointments. Pt able to obtain medications as needed.  Pt has been going to outpatient PT at [facility] twice a week and plans to continue at dc.  TOC will follow and continue to assess and assist with dc planning.  Expected Discharge Plan: Home/Self Care Barriers to Discharge: Continued Medical Work up   Patient Goals and CMS Choice Patient states their goals for this hospitalization and ongoing recovery are:: return home          Expected Discharge Plan and Services In-house Referral: Clinical Social Work     Living arrangements for the past 2 months: Single Family Home                                      Prior Living Arrangements/Services Living arrangements for the past 2 months: Single Family Home Lives with:: Spouse Patient language and need for interpreter reviewed:: Yes Do you feel safe going back to the place where you live?: Yes      Need for Family Participation in Patient Care: Yes (Comment) Care giver support system in place?: Yes (comment)   Criminal Activity/Legal Involvement Pertinent to Current Situation/Hospitalization: No - Comment as needed  Activities of Daily Living   ADL Screening (condition at time of admission) Independently performs ADLs?: No Does the  patient have a NEW difficulty with bathing/dressing/toileting/self-feeding that is expected to last >3 days?: Yes (Initiates electronic notice to provider for possible OT consult) Does the patient have a NEW difficulty with getting in/out of bed, walking, or climbing stairs that is expected to last >3 days?: Yes (Initiates electronic notice to provider for possible PT consult) Does the patient have a NEW difficulty with communication that is expected to last >3 days?: No Is the patient deaf or have difficulty hearing?: No Does the patient have difficulty seeing, even when wearing glasses/contacts?: No Does the patient have difficulty concentrating, remembering, or making decisions?: No  Permission Sought/Granted                  Emotional Assessment Appearance:: Appears stated age Attitude/Demeanor/Rapport: Engaged Affect (typically observed): Accepting Orientation: : Oriented to Self, Oriented to Place, Oriented to  Time, Oriented to Situation Alcohol / Substance Use: Not Applicable Psych Involvement: No (comment)  Admission diagnosis:  UTI (urinary tract infection) [N39.0] UTI due to extended-spectrum beta lactamase (ESBL) producing Escherichia coli [N39.0, B96.29, Z16.12] Patient Active Problem List   Diagnosis Date Noted   UTI (urinary tract infection) 10/01/2023   Pyelonephritis 08/06/2023   Complicated UTI (urinary tract infection) 08/05/2023   Atrial fibrillation (HCC) 06/17/2023   Atopic dermatitis  06/17/2023   Hyperosmolar hyperglycemic state (HHS) (HCC) 02/05/2023   Gastroparesis 02/05/2023   Depression 01/29/2023   Intractable nausea and vomiting 01/20/2023   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) 04/07/2022   Allergy, unspecified, initial encounter 04/07/2022   Dependence on renal dialysis (HCC) 04/07/2022   Gout due to renal impairment, unspecified site 04/07/2022   Hypertensive heart and chronic kidney disease with heart failure and with stage 5 chronic kidney disease, or end stage renal disease (HCC) 04/07/2022   Personal history of transient ischemic attack (TIA), and cerebral infarction without residual deficits 04/07/2022   Renal osteodystrophy 04/07/2022   Venous stasis ulcer of right calf (HCC) 03/31/2022   Fistula, colovaginal 03/26/2022   Diarrhea 03/26/2022   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   Sepsis without acute organ dysfunction (HCC)    Bacteremia    Acute pancreatitis 02/01/2022   Abdominal pain 02/01/2022   SIRS (systemic inflammatory response syndrome) (HCC) 02/01/2022   Transaminitis 02/01/2022   History of anemia due to chronic kidney disease 02/01/2022   Paroxysmal atrial fibrillation (HCC) 02/01/2022   Uncontrolled type 2 diabetes mellitus with hyperglycemia, with  long-term current use of insulin  (HCC) 01/14/2022   NSTEMI (non-ST elevated myocardial infarction) (HCC) 03/05/2021   Acute renal failure superimposed on stage 4 chronic kidney disease (HCC) 08/22/2020   Hypoalbuminemia 05/25/2020   GERD (gastroesophageal reflux disease) 05/25/2020   Pressure injury of skin 05/17/2020   Acute on chronic combined systolic and diastolic congestive heart failure (HCC) 03/07/2020   Type 2 diabetes mellitus with diabetic polyneuropathy, with long-term current use of insulin  (HCC) 03/07/2020   Obesity, Class III, BMI 40-49.9 (morbid obesity) 03/07/2020   Common bile duct (CBD) obstruction 05/28/2019   Benign neoplasm of ascending colon    Benign neoplasm of transverse colon    Benign neoplasm of descending colon    Benign neoplasm of sigmoid colon    Gastric polyps    Hyperkalemia 03/11/2019   Prolonged QT interval 03/11/2019   Acute blood loss anemia 03/11/2019   Onychomycosis 06/21/2018   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)    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 (HCC)    PVD (peripheral vascular disease) (HCC) 10/26/2017   E-coli UTI 07/27/2017   Hypothyroidism 07/27/2017   AKI (acute kidney injury) (HCC)    PAH (pulmonary artery hypertension) (HCC)    Impaired ambulation 07/19/2017   Leg cramps 02/27/2017  Peripheral edema 01/12/2017   Diabetic neuropathy (HCC) 11/12/2016   Anemia of chronic disease 10/03/2015   Historical diagnosis of generalized anxiety disorder 10/03/2015   Secondary insomnia 10/03/2015   Acute bronchitis 09/05/2015   Hyperglycemia due to diabetes mellitus (HCC) 06/07/2015   Non compliance with medical treatment 04/17/2014   Rotator cuff tear 03/14/2014   Obesity 09/23/2013   Chronic HFrEF (heart failure with reduced ejection fraction) (HCC) 06/03/2013   Hypotension 12/25/2012   Hypokalemia 12/25/2012   Hyponatremia 12/25/2012   Urinary incontinence    MDD (major depressive disorder),  recurrent episode, moderate (HCC) 11/12/2010   RBBB (right bundle branch block)    Wide-complex tachycardia    Coronary artery disease    Hyperlipemia 01/22/2009   Chronic hypotension 01/22/2009   PCP:  Austine Lefort, MD Pharmacy:   [facility] [phone] Jonette [place]le, [place] - [address] [place] [address] Phone: [phone] Fax: [phone]     Social Drivers of Health (SDOH) Social History: SDOH Screenings   Food Insecurity: No Food Insecurity (10/01/2023)  Housing: Low Risk  (10/01/2023)  Transportation Needs: No Transportation Needs (10/01/2023)  Utilities: Not At Risk (10/01/2023)  Alcohol Screen: Low Risk  (08/16/2021)  Depression (PHQ2-9): Low Risk  (09/29/2022)  Financial Resource Strain: Low Risk  (08/16/2021)  Physical Activity: Inactive (08/16/2021)  Social Connections: Socially Isolated (10/01/2023)  Stress: No Stress Concern Present (08/16/2021)  Tobacco Use: Medium Risk (09/23/2023)   SDOH Interventions:     Readmission Risk Interventions    10/02/2023   12:43 PM 08/06/2023    4:00 PM 01/21/2023    3:09 PM  Readmission Risk Prevention Plan  Transportation Screening Complete Complete Complete  Medication Review (RN Care Manager)  Referral to Pharmacy Referral to Pharmacy  PCP or Specialist appointment within 3-5 days of discharge Complete Complete Complete  HRI or Home Care Consult Complete Complete Complete  SW Recovery Care/Counseling Consult Complete Complete Complete  Palliative Care Screening Not Applicable Not Applicable Not Applicable  Skilled Nursing Facility Not Applicable Not Applicable Not Applicable

## 2023-10-02 NOTE — Consult Note (Signed)
  [facility] Consult Note: Reason for Consult: To manage dialysis and dialysis related needs Referring Physician: Dr. Lincoln Renshaw, [place]  HPI:  Susan [name] is an 73 y.o. female with past medical history significant for type II DM, HTN, HLD, hypothyroidism, CAD status post stent, chronic systolic CHF, ESRD on HD who was presented with altered mental status and not feeling well, seen as a consultation for the management of ESRD. Apparently the patient was not feeling well recently mainly with nausea, vomiting, poor appetite and generalized weakness.  She went to dialysis yesterday but found to be very sick therefore sent to ER for evaluation.  In the ER, she was found to have UTI therefore admitted for possible metabolic encephalopathy and urinary tract infection. She is currently on meropenem , awaiting culture results. Blood pressure looks acceptable, using 2 L of oxygen  with 100% oxygen  saturation.  Chest x-ray with central pulmonary vascular congestion.  Labs showed sodium 132, potassium 3.7 BUN 44, hemoglobin 9.7. Patient continued to feel nausea and intermittent vomiting.  She does not want dialysis today and tomorrow.  On examination the volume status looks acceptable therefore we will plan for dialysis tomorrow. OP HD orders: Dialyzes at [facility], TTS, 4 hr, EDW 76.2 kg. HD 2k2ca [place] , 400/800 Heparin  .  Access RIJ TDC. Mircera 100 mcg on 5/3 Calcitriol  2.5 mcg three times per week  Past Medical History:  Diagnosis Date   Anemia    hx   Anxiety    Arthritis    "generalized" (03/15/2014)   CAD (coronary artery disease)    MI in 2000 - MI  2007 - treated bare metal stent (no nuclear since then as 9/11)   Carotid artery disease (HCC)    Chronic diastolic heart failure (HCC)    a) ECHO (08/2013) EF 55-60% and RV function nl b) RHC (08/2013) RA 4, RV 30/5/7, PA 25/10 (16), PCWP 7, Fick CO/CI 6.3/2.7, PVR 1.5 WU, PA 61 and 66%   Daily headache    "~ every  other day; since I fell in June" (03/15/2014)   Depression    Diabetic retinopathy (HCC)    Dyslipidemia    ESRD (end stage renal disease) (HCC)    Dialysis on Tues Thurs Sat   Exertional shortness of breath    GERD (gastroesophageal reflux disease)    History of blood transfusion    History of kidney stones    HTN (hypertension)    Hypothyroidism    Myocardial infarction (HCC)    Obesity    Osteoarthritis    PAF (paroxysmal atrial fibrillation) (HCC)    Peripheral neuropathy    bilateral feet/hands   PONV (postoperative nausea and vomiting)    RBBB (right bundle branch block)    Old   Stroke (HCC)    mini strokes   Type II diabetes mellitus (HCC)    Type II, [name] libre left upper arm. patient has omnipod insulin  pump with Novolin R Insulin     Past Surgical History:  Procedure Laterality Date   A/V FISTULAGRAM Left 11/07/2022   Procedure: A/V Fistulagram;  Surgeon: Carlene Che, MD;  [facility];  Service: Cardiovascular;  Laterality: Left;   ABDOMINAL HYSTERECTOMY  1980's   AMPUTATION Right 02/24/2018   Procedure: RIGHT FOOT GREAT TOE AND 2ND TOE AMPUTATION;  Surgeon: Timothy Ford, MD;  Location: [facility] OR;  Service: Orthopedics;  Laterality: Right;   AMPUTATION Right 04/30/2018   Procedure: RIGHT TRANSMETATARSAL AMPUTATION;  Surgeon: Gearldean Keepers  V, MD;  Location: [facility] OR;  Service: Orthopedics;  Laterality: Right;   AMPUTATION Right 05/02/2022   Procedure: RIGHT BELOW KNEE AMPUTATION;  Surgeon: Timothy Ford, MD;  Location: [facility] OR;  Service: Orthopedics;  Laterality: Right;   APPLICATION OF WOUND VAC Right 06/13/2022   Procedure: APPLICATION OF WOUND VAC;  Surgeon: Timothy Ford, MD;  [facility];  Service: Orthopedics;  Laterality: Right;   APPLICATION OF WOUND VAC Left 11/14/2022   Procedure: APPLICATION OF WOUND VAC;  Surgeon: Timothy Ford, MD;  [facility];  Service: Orthopedics;  Laterality: Left;   AV FISTULA PLACEMENT Left 04/02/2022    Procedure: LEFT ARM ARTERIOVENOUS (AV) FISTULA CREATION;  Surgeon: Margherita Shell, MD;  [facility];  Service: Vascular;  Laterality: Left;  PERIPHERAL NERVE BLOCK   AV FISTULA PLACEMENT Right 04/27/2023   Procedure: RIGHT ARM BRACHIOBASILIC ARTERIOVENOUS (AV) FISTULA CREATION;  Surgeon: Carlene Che, MD;  [facility];  Service: Vascular;  Laterality: Right;   BASCILIC VEIN TRANSPOSITION Left 07/31/2022   Procedure: LEFT ARM SECOND STAGE BASILIC VEIN TRANSPOSITION;  Surgeon: Margherita Shell, MD;  [facility];  Service: Vascular;  Laterality: Left;   BIOPSY  05/27/2020   Procedure: BIOPSY;  Surgeon: Vinetta Greening, DO;  [facility];  Service: Endoscopy;;   CATARACT EXTRACTION, BILATERAL Bilateral ?2013   COLONOSCOPY W/ POLYPECTOMY     COLONOSCOPY WITH PROPOFOL  N/A 03/13/2019   Procedure: COLONOSCOPY WITH PROPOFOL ;  Surgeon: Nannette Babe, MD;  Location: [facility] ENDOSCOPY;  Service: Gastroenterology;  Laterality: N/A;   CORONARY ANGIOPLASTY WITH STENT PLACEMENT  1999; 2007   "1 + 1"   ERCP N/A 02/03/2022   Procedure: ENDOSCOPIC RETROGRADE CHOLANGIOPANCREATOGRAPHY (ERCP);  Surgeon: Alvis Jourdain, MD;  Location: [facility] ENDOSCOPY;  Service: Gastroenterology;  Laterality: N/A;   ESOPHAGOGASTRODUODENOSCOPY N/A 02/12/2023   Procedure: ESOPHAGOGASTRODUODENOSCOPY (EGD);  Surgeon: Alvis Jourdain, MD;  Location: [facility] ENDOSCOPY;  Service: Gastroenterology;  Laterality: N/A;   ESOPHAGOGASTRODUODENOSCOPY (EGD) WITH PROPOFOL  N/A 03/13/2019   Procedure: ESOPHAGOGASTRODUODENOSCOPY (EGD) WITH PROPOFOL ;  Surgeon: Nannette Babe, MD;  Location: [facility] ENDOSCOPY;  Service: Gastroenterology;  Laterality: N/A;   ESOPHAGOGASTRODUODENOSCOPY (EGD) WITH PROPOFOL  N/A 05/27/2020   Procedure: ESOPHAGOGASTRODUODENOSCOPY (EGD) WITH PROPOFOL ;  Surgeon: Vinetta Greening, DO;  [facility];  Service: Endoscopy;  Laterality: N/A;   ESOPHAGOGASTRODUODENOSCOPY (EGD) WITH PROPOFOL  N/A 09/03/2022   Procedure:  ESOPHAGOGASTRODUODENOSCOPY (EGD) WITH PROPOFOL ;  Surgeon: Alvis Jourdain, MD;  Location: [facility] ENDOSCOPY;  Service: Gastroenterology;  Laterality: N/A;   EYE SURGERY Bilateral    lazer   FLEXIBLE SIGMOIDOSCOPY N/A 05/23/2022   Procedure: FLEXIBLE SIGMOIDOSCOPY;  Surgeon: Alvis Jourdain, MD;  Location[facility] END[facility]COPY;  Service: Gastroenterology;  Laterality: N/A;   FLEXIBLE SIGMOIDOSCOPY N/A 05/24/2022   Procedure: FLEXIBLE SIGMOIDOSCOPY;  Surgeon: Daina Drum, MD;  Location: [facility] ENDOSCOPY;  Service: Gastroenterology;  Laterality: N/A;   FLEXIBLE SIGMOIDOSCOPY N/A 09/03/2022   Procedure: FLEXIBLE SIGMOIDOSCOPY;  Surgeon: Alvis Jourdain, MD;  Location: [facility] ENDOSCOPY;  Service: Gastroenterology;  Laterality: N/A;   HEMOSTASIS CLIP PLACEMENT  03/13/2019   Procedure: HEMOSTASIS CLIP PLACEMENT;  Surgeon: Nannette Babe, MD;  Location: [facility] ENDOSCOPY;  Service: Gastroenterology;;   HEMOSTASIS CLIP PLACEMENT  05/23/2022   Procedure: HEMOSTASIS CLIP PLACEMENT;  Surgeon: Alvis Jourdain, MD;  Location: [facility] ENDOSCOPY;  Service: Gastroenterology;;   HEMOSTASIS CONTROL  05/24/2022   Procedure: HEMOSTASIS CONTROL;  Surgeon: Daina Drum, MD;  Location: [facility] ENDOSCOPY;  Service: Gastroenterology;;   HOT HEMOSTASIS N/A  05/23/2022   Procedure: HOT HEMOSTASIS (ARGON PLASMA COAGULATION/BICAP);  Surgeon: Alvis Jourdain, MD;  Location: [facility] ENDOSCOPY;  Service: Gastroenterology;  Laterality: N/A;   I & D EXTREMITY Left 05/05/2022   Procedure: IRRIGATION AND DEBRIDEMENT LEFT ARM AV FISTULA;  Surgeon: Young Hensen, MD;  Location: [name] OR;  Service: Vascular;  Laterality: Left;   I & D EXTREMITY N/A 11/14/2022   Procedure: IRRIGATION AND DEBRIDEMENT OF LOWER EXTREMITY WOUND;  Surgeon: Timothy Ford, MD;  [facility];  Service: Orthopedics;  Laterality: N/A;   INSERTION OF DIALYSIS CATHETER Right 04/02/2022   Procedure: INSERTION OF TUNNELED DIALYSIS CATHETER;  Surgeon: Margherita Shell, MD;  [facility];  Service:  Vascular;  Laterality: Right;   IR FLUORO GUIDE CV LINE RIGHT  08/10/2023   IR VENOCAVAGRAM IVC  08/10/2023   KNEE ARTHROSCOPY Left 10/25/2006   POLYPECTOMY  03/13/2019   Procedure: POLYPECTOMY;  Surgeon: Nannette Babe, MD;  Location: [facility] ENDOSCOPY;  Service: Gastroenterology;;   REMOVAL OF STONES  02/03/2022   Procedure: REMOVAL OF STONES;  Surgeon: Alvis Jourdain, MD;  Location: [facility] ENDOSCOPY;  Service: Gastroenterology;;   REVISON OF ARTERIOVENOUS FISTULA Left 08/20/2022   Procedure: REVISON OF LEFT ARM ARTERIOVENOUS FISTULA;  Surgeon: Margherita Shell, MD;  [facility];  Service: Vascular;  Laterality: Left;   RIGHT HEART CATH N/A 07/24/2017   Procedure: RIGHT HEART CATH;  Surgeon: Mardell Shade, MD;  [facility];  Service: Cardiovascular;  Laterality: N/A;   RIGHT HEART CATHETERIZATION N/A 09/22/2013   Procedure: RIGHT HEART CATH;  Surgeon: Mardell Shade, MD;  Location: [facility] CATH LAB;  Service: Cardiovascular;  Laterality: N/A;   SHOULDER ARTHROSCOPY WITH OPEN ROTATOR CUFF REPAIR Right 03/14/2014   Procedure: RIGHT SHOULDER ARTHROSCOPY WITH BICEPS RELEASE, OPEN SUBSCAPULA REPAIR, OPEN SUPRASPINATUS REPAIR.;  Surgeon: Jasmine Mesi, MD;  Location: [facility] OR;  Service: Orthopedics;  Laterality: Right;   SPHINCTEROTOMY  02/03/2022   Procedure: SPHINCTEROTOMY;  Surgeon: Alvis Jourdain, MD;  Location: [facility] ENDOSCOPY;  Service: Gastroenterology;;   STUMP REVISION Right 06/13/2022   Procedure: REVISION RIGHT BELOW KNEE AMPUTATION;  Surgeon: Timothy Ford, MD;  Location: [facility] OR;  Service: Orthopedics;  Laterality: Right;   TEE WITHOUT CARDIOVERSION N/A 02/04/2022   Procedure: TRANSESOPHAGEAL ECHOCARDIOGRAM (TEE);  Surgeon: Mardell Shade, MD;  Location: [facility] ENDOSCOPY;  Service: Cardiovascular;  Laterality: N/A;   THROMBECTOMY W/ EMBOLECTOMY Left 08/20/2022   Procedure: THROMBECTOMY OF LEFT ARM ARTERIOVENOUS FISTULA;  Surgeon: Margherita Shell, MD;  Location: [facility] OR;  Service:  Vascular;  Laterality: Left;   TOE AMPUTATION Right 02/24/2018   GREAT TOE AND 2ND TOE AMPUTATION   TUBAL LIGATION  1970's   UPPER EXTREMITY VENOGRAPHY N/A 07/31/2023   Procedure: UPPER EXTREMITY VENOGRAPHY;  Surgeon: Carlene Che, MD;  [facility];  Service: Cardiovascular;  Laterality: N/A;    Family History  Problem Relation Age of Onset   Heart attack Mother 58    Social History:  reports that she quit smoking about 25 years ago. Her smoking use included cigarettes. She started smoking about 57 years ago. She has a 96 pack-year smoking history. She has never used smokeless tobacco. She reports that she does not currently use alcohol. She reports that she does not use drugs.  Allergies:  Allergies  Allergen Reactions   Cephalexin  Diarrhea and Other (See Comments)   Codeine Nausea And Vomiting and Other (See Comments)    Medications: I have  reviewed the patient's current medications.   Results for orders placed or performed during the hospital encounter of 10/01/23 (from the past 48 hours)  CBC     Status: Abnormal   Collection Time: 10/01/23  1:37 PM  Result Value Ref Range   WBC 9.5 4.0 - 10.5 K/uL   RBC 4.57 3.87 - 5.11 MIL/uL   Hemoglobin 12.7 12.0 - 15.0 g/dL   HCT 91.4 78.2 - 95.6 %   MCV 89.3 80.0 - 100.0 fL   MCH 27.8 26.0 - 34.0 pg   MCHC 31.1 30.0 - 36.0 g/dL   RDW 21.3 (H) 08.6 - 57.8 %   Platelets 452 (H) 150 - 400 K/uL   nRBC 0.0 0.0 - 0.2 %    Comment: Performed at [facility], [address], [place], [place] [address]  Comprehensive metabolic panel with GFR     Status: Abnormal   Collection Time: 10/01/23  1:37 PM  Result Value Ref Range   Sodium 128 (L) 135 - 145 mmol/L   Potassium 3.3 (L) 3.5 - 5.1 mmol/L   Chloride 87 (L) 98 - 111 mmol/L   CO2 25 22 - 32 mmol/L   Glucose, Bld 163 (H) 70 - 99 mg/dL    Comment: Glucose reference range applies only to samples taken after fasting for at least 8 hours.   BUN 34 (H) 8 - 23 mg/dL    Creatinine, Ser 9.52 (H) 0.44 - 1.00 mg/dL   Calcium  9.6 8.9 - 10.3 mg/dL   Total Protein 7.5 6.5 - 8.1 g/dL   Albumin  3.1 (L) 3.5 - 5.0 g/dL   AST 17 15 - 41 U/L   ALT 15 0 - 44 U/L   Alkaline Phosphatase 207 (H) 38 - 126 U/L   Total Bilirubin 1.0 0.0 - 1.2 mg/dL   GFR, Estimated 10 (L) >60 mL/min    Comment: (NOTE) Calculated using the CKD-EPI Creatinine Equation (2021)    Anion gap 16 (H) 5 - 15    Comment: Performed at [facility], [address], [place], [place] [address]  Blood gas, venous     Status: Abnormal   Collection Time: 10/01/23  2:42 PM  Result Value Ref Range   pH, Ven 7.44 (H) 7.25 - 7.43   pCO2, Ven 51 44 - 60 mmHg   pO2, Ven <31 (LL) 32 - 45 mmHg    Comment: CRITICAL RESULT CALLED TO, READ BACK BY AND VERIFIED WITH: ASHLEY JOYCE @ 1503 ON 10/01/23 C VARNER    Bicarbonate 34.5 (H) 20.0 - 28.0 mmol/L   Acid-Base Excess 8.5 (H) 0.0 - 2.0 mmol/L   O2 Saturation 46.6 %   Patient temperature 36.6    Collection site BLOOD RIGHT WRIST    Drawn by [id number]     Comment: Performed at [facility], [address] [place], [place] [address]  Culture, blood (Routine X 2) w Reflex to ID Panel     Status: None (Preliminary result)   Collection Time: 10/01/23  2:54 PM   Specimen: BLOOD  Result Value Ref Range   Specimen Description BLOOD BLOOD LEFT ARM    Special Requests      BOTTLES DRAWN AEROBIC ONLY Blood Culture results may not be optimal due to an inadequate volume of blood received in culture bottles   Culture      NO GROWTH < 24 HOURS Performed at [facility], [address], [place], [place] [address]    Report Status PENDING   Culture,  blood (Routine X 2) w Reflex to ID Panel     Status: None (Preliminary result)   Collection Time: 10/01/23  2:54 PM   Specimen: BLOOD  Result Value Ref Range   Specimen Description BLOOD BLOOD RIGHT ARM    Special Requests      BOTTLES DRAWN AEROBIC ONLY Blood Culture results may not be optimal due to an inadequate volume of  blood received in culture bottles   Culture      NO GROWTH < 24 HOURS Performed at [facility], [address], [place], [place] [address]    Report Status PENDING   Urinalysis, Routine w reflex microscopic -Urine, Clean Catch     Status: Abnormal   Collection Time: 10/01/23  3:05 PM  Result Value Ref Range   Color, Urine AMBER (A) YELLOW    Comment: BIOCHEMICALS MAY BE AFFECTED BY COLOR   APPearance CLOUDY (A) CLEAR   Specific Gravity, Urine 1.011 1.005 - 1.030   pH 5.0 5.0 - 8.0   Glucose, UA NEGATIVE NEGATIVE mg/dL   Hgb urine dipstick MODERATE (A) NEGATIVE   Bilirubin Urine NEGATIVE NEGATIVE   Ketones, ur NEGATIVE NEGATIVE mg/dL   Protein, ur 784 (A) NEGATIVE mg/dL   Nitrite NEGATIVE NEGATIVE   Leukocytes,Ua LARGE (A) NEGATIVE   RBC / HPF 11-20 0 - 5 RBC/hpf   WBC, UA >50 0 - 5 WBC/hpf   Bacteria, UA MANY (A) NONE SEEN   Squamous Epithelial / HPF 0-5 0 - 5 /HPF   WBC Clumps PRESENT    Amorphous Crystal PRESENT     Comment: Performed at [facility], [address] [place], [place] [address]  Glucose, capillary     Status: Abnormal   Collection Time: 10/02/23 12:53 AM  Result Value Ref Range   Glucose-Capillary 213 (H) 70 - 99 mg/dL    Comment: Glucose reference range applies only to samples taken after fasting for at least 8 hours.  MRSA Next Gen by PCR, Nasal     Status: None   Collection Time: 10/02/23  3:52 AM   Specimen: Nasal Mucosa; Nasal Swab  Result Value Ref Range   MRSA by PCR Next Gen NOT DETECTED NOT DETECTED    Comment: (NOTE) The GeneXpert MRSA Assay (FDA approved for NASAL specimens only), is one component of a comprehensive MRSA colonization surveillance program. It is not intended to diagnose MRSA infection nor to guide or monitor treatment for MRSA infections. Test performance is not FDA approved in patients less than 82 years old. Performed at [facility], [address] [place], [place] [address]   Basic metabolic panel     Status: Abnormal    Collection Time: 10/02/23  3:56 AM  Result Value Ref Range   Sodium 132 (L) 135 - 145 mmol/L   Potassium 3.7 3.5 - 5.1 mmol/L   Chloride 90 (L) 98 - 111 mmol/L   CO2 19 (L) 22 - 32 mmol/L   Glucose, Bld 220 (H) 70 - 99 mg/dL    Comment: Glucose reference range applies only to samples taken after fasting for at least 8 hours.   BUN 44 (H) 8 - 23 mg/dL   Creatinine, Ser 3.24 (H) 0.44 - 1.00 mg/dL   Calcium  8.6 (L) 8.9 - 10.3 mg/dL   GFR, Estimated 9 (L) >60 mL/min    Comment: (NOTE) Calculated using the CKD-EPI Creatinine Equation (2021)    Anion gap 23 (H) 5 - 15    Comment: ELECTROLYTES REPEATED TO VERIFY Performed  at [facility], [address] [place], [place] [address]   CBC     Status: Abnormal   Collection Time: 10/02/23  3:56 AM  Result Value Ref Range   WBC 8.3 4.0 - 10.5 K/uL   RBC 3.62 (L) 3.87 - 5.11 MIL/uL   Hemoglobin 9.7 (L) 12.0 - 15.0 g/dL   HCT 19.1 (L) 47.8 - 29.5 %   MCV 92.8 80.0 - 100.0 fL   MCH 26.8 26.0 - 34.0 pg   MCHC 28.9 (L) 30.0 - 36.0 g/dL   RDW 62.1 (H) 30.8 - 65.7 %   Platelets 365 150 - 400 K/uL   nRBC 0.0 0.0 - 0.2 %    Comment: Performed at [facility] [address], [place], [place] [address]  Glucose, capillary     Status: Abnormal   Collection Time: 10/02/23  7:38 AM  Result Value Ref Range   Glucose-Capillary 249 (H) 70 - 99 mg/dL    Comment: Glucose reference range applies only to samples taken after fasting for at least 8 hours.   *Note: Due to a large number of results and/or encounters for the requested time period, some results have not been displayed. A complete set of results can be found in Results Review.    CT ABDOMEN PELVIS W CONTRAST Result Date: 10/01/2023 CLINICAL DATA:  Altered mental status.  Weakness. EXAM: CT ABDOMEN AND PELVIS WITH CONTRAST TECHNIQUE: Multidetector CT imaging of the abdomen and pelvis was performed using the standard protocol following bolus administration of intravenous contrast. RADIATION DOSE  REDUCTION: This exam was performed according to the departmental dose-optimization program which includes automated exposure control, adjustment of the mA and/or kV according to patient size and/or use of iterative reconstruction technique. CONTRAST:  OMNIPAQUE  IOHEXOL  300 MG/ML  SOLN COMPARISON:  August 09, 2023. FINDINGS: Lower chest: Small right pleural effusion is noted. Minimal bilateral posterior basilar subsegmental atelectasis is noted. Hepatobiliary: Cholelithiasis is noted. No biliary dilatation is noted. Multiple small calcified granulomas are noted in the hepatic parenchyma. Pancreas: Unremarkable. No pancreatic ductal dilatation or surrounding inflammatory changes. Spleen: Multiple small calcified granulomas are noted. Adrenals/Urinary Tract: Adrenal glands appear normal. Bilateral renal cortical scarring is noted. Right renal cyst is noted. No hydronephrosis or renal obstruction is noted. Urinary bladder is decompressed. Stomach/Bowel: Stomach is unremarkable. There is no evidence of bowel obstruction. The appendix appears normal. Sigmoid diverticulosis is noted without inflammation. Moderate amount of stool seen in the rectum concerning for impaction. Vascular/Lymphatic: Aortic atherosclerosis. No enlarged abdominal or pelvic lymph nodes. Reproductive: Status post hysterectomy. No adnexal masses. Other: No ascites or hernia. Musculoskeletal: No acute or significant osseous findings. IMPRESSION: Small right pleural effusion. Cholelithiasis. Bilateral renal cortical scarring. Sigmoid diverticulosis without inflammation. Moderate amount of stool seen in rectum concerning for impaction. Aortic Atherosclerosis (ICD10-I70.0). Electronically Signed   By: Rosalene Colon M.D.   On: 10/01/2023 16:07   CT HEAD WO CONTRAST ( ) Result Date: 10/01/2023 CLINICAL DATA:  Delirium. EXAM: CT HEAD WITHOUT CONTRAST TECHNIQUE: Contiguous axial images were obtained from the base of the skull through the vertex  without intravenous contrast. RADIATION DOSE REDUCTION: This exam was performed according to the departmental dose-optimization program which includes automated exposure control, adjustment of the mA and/or kV according to patient size and/or use of iterative reconstruction technique. COMPARISON:  Head CT 01/15/2022 FINDINGS: Brain: There is no evidence of an acute infarct, intracranial hemorrhage, mass, midline shift, or extra-axial fluid collection. There is mild cerebral atrophy. Patchy cerebral white  matter hypodensities have likely mildly progressed and are nonspecific but compatible with moderate chronic small vessel ischemic disease. 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 Vascular: Calcified atherosclerosis at the skull base. No hyperdense vessel. Skull: No fracture or suspicious lesion. Sinuses/Orbits: Visualized paranasal sinuses and mastoid air cells are clear. Bilateral cataract extraction. Other: None. IMPRESSION: 1. No evidence of acute intracranial abnormality. 2. Moderate chronic small vessel ischemic disease. Electronically Signed   By: Aundra [name] M.D.   On: 10/01/2023 15:59   DG Chest Port 1 View Result Date: 10/01/2023 CLINICAL DATA:  Hypoxia. EXAM: PORTABLE CHEST 1 VIEW COMPARISON:  08/13/2023. FINDINGS: Stable cardiomegaly. Right IJ dialysis catheter tip overlies the right atrium, unchanged. Aortic atherosclerosis. Central pulmonary vascular congestion with bilateral interstitial prominence, most pronounced at the lung bases, which could reflect interstitial pulmonary edema. Possible small left pleural effusion. No pneumothorax. No acute osseous abnormality. IMPRESSION: 1. Cardiomegaly with central pulmonary vascular congestion and findings suggestive of interstitial pulmonary edema. 2. Possible small left pleural effusion. Electronically Signed   By: Mannie Seek M.D.   On: 10/01/2023 13:59    ROS: As per H&P, rest of the systems reviewed and negative. Blood pressure  (!) 111/48, pulse 79, temperature 97.7 F (36.5 C), temperature source Oral, resp. rate 18, height 5\' 5"  (1.651 m), weight 74.2 kg, SpO2 100%. Gen: NAD, comfortable Respiratory: Clear bilateral, no wheezing or crackle Cardiovascular: Regular rate rhythm S1-S2 normal, no rubs GI: Abdomen soft, nontender, nondistended Extremities, no cyanosis or clubbing, no edema Skin: No rash or ulcer Neurology: Alert, awake, following commands, oriented Dialysis Access: [facility]  Assessment/Plan:  # AMS/UTI: Currently on meropenem , follow culture results.  Mental status seems to have improved.  # ESRD: TTS, we will plan for regular dialysis tomorrow.  No urgent need for HD today.  She is able to lie flat, not much oral intake due to nausea and vomiting.  Lites are okay.  Patient does not want dialysis today and tomorrow.  I have reviewed outpatient treatment records  # Hypertension: BP and volume status acceptable.  # Anemia of ESRD: Just received erythropoietin as outpatient, monitor hemoglobin.  # Secondary hyperparathyroidism, metabolic Bone Disease: Continue calcitriol  and sevelamer .  Follow labs  Discussed with the primary team and dialysis nurse.  Nicklos Gaxiola [place] Planas 10/02/2023, 10:11 AM

## 2023-10-02 NOTE — Plan of Care (Signed)

## 2023-10-02 NOTE — Progress Notes (Signed)
 Patient BP continues to read low, patient is asymptomatic. Dr. Sunnie England notified through secure chat, no new orders given at this time. Will continue to monitor.

## 2023-10-02 NOTE — Hospital Course (Signed)
 74 y.o. female, with PMH significant for ESRD-HD-TTS, PAF on Eliquis , CAD status post PCI, chronic systolic CHF with EF 30-35%, DM2, HTN, HLD, hypothyroidism, anemia of CKD, right BKA, and depression/anxiety with recent hospitalization on 3/12, ESBL UTI and bacteremia.  The right subclavian HD catheter exchanged during that admission.   -Pt was sent from HD center for evaluation, patient reports she has not been feeling well, having nausea, occasional vomiting, poor appetite, constipation, as well she does endorse dysuria, was found to be very sick at dialysis center, so was sent to ED for evaluation. - In ED, her UA was positive, she does endorse dysuria, was positive, no leukocytosis, afebrile, sodium was low at 128, potassium at 3.3, CT abdomen pelvis with diverticulosis, and constipation, treated hospitalist consulted to admit.

## 2023-10-02 NOTE — Plan of Care (Signed)
  Problem: Education: Goal: Knowledge of General Education information will improve Description: Including pain rating scale, medication(s)/side effects and non-pharmacologic comfort measures Outcome: Progressing   Problem: Pain Managment: Goal: General experience of comfort will improve and/or be controlled Outcome: Progressing   Problem: Education: Goal: Ability to describe self-care measures that may prevent or decrease complications (Diabetes Survival Skills Education) will improve Outcome: Progressing

## 2023-10-03 DIAGNOSIS — I10 Essential (primary) hypertension: Secondary | ICD-10-CM | POA: Diagnosis not present

## 2023-10-03 DIAGNOSIS — N3001 Acute cystitis with hematuria: Secondary | ICD-10-CM | POA: Diagnosis not present

## 2023-10-03 DIAGNOSIS — N186 End stage renal disease: Secondary | ICD-10-CM | POA: Diagnosis not present

## 2023-10-03 DIAGNOSIS — I482 Chronic atrial fibrillation, unspecified: Secondary | ICD-10-CM | POA: Diagnosis not present

## 2023-10-03 LAB — GLUCOSE, CAPILLARY
Glucose-Capillary: 264 mg/dL — ABNORMAL HIGH (ref 70–99)
Glucose-Capillary: 233 mg/dL — ABNORMAL HIGH (ref 70–99)
Glucose-Capillary: 77 mg/dL (ref 70–99)
Glucose-Capillary: 192 mg/dL — ABNORMAL HIGH (ref 70–99)

## 2023-10-03 LAB — HEPATITIS B SURFACE ANTIGEN: Hepatitis B Surface Ag: NONREACTIVE

## 2023-10-03 LAB — RENAL FUNCTION PANEL
Albumin: 2.6 g/dL — ABNORMAL LOW (ref 3.5–5.0)
Anion gap: 18 — ABNORMAL HIGH (ref 5–15)
BUN: 55 mg/dL — ABNORMAL HIGH (ref 8–23)
CO2: 21 mmol/L — ABNORMAL LOW (ref 22–32)
Calcium: 8.2 mg/dL — ABNORMAL LOW (ref 8.9–10.3)
Chloride: 89 mmol/L — ABNORMAL LOW (ref 98–111)
Creatinine, Ser: 6.35 mg/dL — ABNORMAL HIGH (ref 0.44–1.00)
GFR, Estimated: 6 mL/min — ABNORMAL LOW (ref >60)
Glucose, Bld: 224 mg/dL — ABNORMAL HIGH (ref 70–99)
Phosphorus: 5.3 mg/dL — ABNORMAL HIGH (ref 2.5–4.6)
Potassium: 4 mmol/L (ref 3.5–5.1)
Sodium: 128 mmol/L — ABNORMAL LOW (ref 135–145)

## 2023-10-03 LAB — CBC
HCT: 33.9 % — ABNORMAL LOW (ref 36.0–46.0)
Hemoglobin: 10.5 g/dL — ABNORMAL LOW (ref 12.0–15.0)
MCH: 27.9 pg (ref 26.0–34.0)
MCHC: 31 g/dL (ref 30.0–36.0)
MCV: 90.2 fL (ref 80.0–100.0)
Platelets: 313 10*3/uL (ref 150–400)
RBC: 3.76 MIL/uL — ABNORMAL LOW (ref 3.87–5.11)
RDW: 17.6 % — ABNORMAL HIGH (ref 11.5–15.5)
WBC: 5.8 10*3/uL (ref 4.0–10.5)
nRBC: 0 % (ref 0.0–0.2)

## 2023-10-03 LAB — MAGNESIUM: Magnesium: 2.4 mg/dL (ref 1.7–2.4)

## 2023-10-03 MED ORDER — DOXERCALCIFEROL 4 MCG/2ML IV SOLN
INTRAVENOUS | Status: AC
  Filled 2023-10-03: qty 2

## 2023-10-03 MED ORDER — INSULIN ASPART 100 UNIT/ML IJ SOLN
10.0000 [IU] | Freq: Three times a day (TID) | INTRAMUSCULAR | Status: DC
  Administered 2023-10-03 – 2023-10-05 (×5): 10 [IU] via SUBCUTANEOUS

## 2023-10-03 MED ORDER — INSULIN GLARGINE-YFGN 100 UNIT/ML ~~LOC~~ SOLN
30.0000 [IU] | Freq: Every day | SUBCUTANEOUS | Status: DC
  Administered 2023-10-03: 30 [IU] via SUBCUTANEOUS
  Filled 2023-10-03 (×3): qty 0.3

## 2023-10-03 MED ORDER — DOXERCALCIFEROL 4 MCG/2ML IV SOLN: 4.0000 ug | INTRAVENOUS | Status: DC

## 2023-10-03 MED ORDER — DOXERCALCIFEROL 4 MCG/2ML IV SOLN
4.0000 ug | INTRAVENOUS | Status: DC
  Administered 2023-10-03 – 2023-10-06 (×2): 4 ug via INTRAVENOUS

## 2023-10-03 MED ORDER — ALTEPLASE 2 MG IJ SOLR: 2.0000 mg | Freq: Once | INTRAMUSCULAR | Status: DC | PRN

## 2023-10-03 MED ORDER — HEPARIN SODIUM (PORCINE) 1000 UNIT/ML DIALYSIS
1000.0000 [IU] | INTRAMUSCULAR | Status: DC | PRN
  Administered 2023-10-06: 3800 [IU]

## 2023-10-03 MED ORDER — HEPARIN SODIUM (PORCINE) 1000 UNIT/ML DIALYSIS
20.0000 [IU]/kg | INTRAMUSCULAR | Status: DC | PRN
  Administered 2023-10-06: 1500 [IU] via INTRAVENOUS_CENTRAL

## 2023-10-03 NOTE — Procedures (Signed)
   HEMODIALYSIS TREATMENT NOTE:  Uneventful 4 hour low-heparin  treatment completed using right IJ TDC.  Cath exit site is unremarkable. Net UF 2 liters.  All blood was returned.  Pt is 1.4 kg below her EDW.  Meds given:  Hectorol 4 mcg IVP  Waunita Haff, RN AP [facility]

## 2023-10-03 NOTE — Progress Notes (Signed)
 PROGRESS NOTE   Susan [name]  QIO:[id number] DOB: Jan 10, 1951 DOA: 10/01/2023 PCP: Austine Lefort, MD   Chief Complaint  Patient presents with   Altered Mental Status   Level of care: Med-Surg  Brief Admission History:  73 y.o. female, with PMH significant for ESRD-HD-TTS, PAF on Eliquis , CAD status post PCI, chronic systolic CHF with EF 30-35%, DM2, HTN, HLD, hypothyroidism, anemia of CKD, right BKA, and depression/anxiety with recent hospitalization on 3/12, ESBL UTI and bacteremia.  The right subclavian HD catheter exchanged during that admission.   -Pt was sent from HD center for evaluation, patient reports she has not been feeling well, having nausea, occasional vomiting, poor appetite, constipation, as well she does endorse dysuria, was found to be very sick at dialysis center, so was sent to ED for evaluation. - In ED, her UA was positive, she does endorse dysuria, was positive, no leukocytosis, afebrile, sodium was low at 128, potassium at 3.3, CT abdomen pelvis with diverticulosis, and constipation, treated hospitalist consulted to admit.   Assessment and Plan:  UTI - Patient with known history of ESBL UTI and bacteremia in the past, she presents with dysuria, had leukocytosis - UA is positive - Continue with meropenem , follow cultures    ESRD-HD-TTS -She missed her HD as she was sent from dialysis center due to her illness. - Location for emergency dialysis overnight, renal consult requested in epic to dialyze tomorrow. - Patient right subclavian hemodialysis catheter was changed out by IR 3/17 because of bacteremia during previous admission - HD treatment planned for today per nephrologist   Chronic hyponatremia -At baseline, management with dialysis   Hypokalemia -management with dialysis   Chronic hypotension Chronic systolic CHF -Continue with home midodrine  before dialysis days -No significant evidence of volume overload currently, so can wait for dialysis till  tomorrow -Most recent echo from April 2024 with EF 30 to 35%, moderate to severe LV dysfunction   PAF  Heart rate controlled with amiodarone  Chronically anticoagulated with apixaban     CAD, HLD Apixaban  and statin   Type 2 diabetes mellitus uncontrolled with hyperglycemia - Check A1c 6.8%  - increased to 30 units of Semglee  and novolog  10 units TID with meals, plus insulin  sliding scale  CBG (last 3)  Recent Labs    10/02/23 2016 10/03/23 0808 10/03/23 1113  GLUCAP 257* 264* 233*   Anemia of CKD Hemoglobin at baseline, management per renal   Hypothyroidism Continue Synthroid    Depression/anxiety Continue Klonopin  nightly, Ativan  nightly, melatonin nightly, Effexor  daily   Constipation concern for fecal impaction -started on bowel regimen -added molasses enema treatment    DVT prophylaxis: apixaban   Code Status: Full  Family Communication:  Disposition: anticipate DC home   Consultants:  Nephrology   Procedures:   Antimicrobials:  Meropenem  5/8>>   Subjective: Pt says that she is still burning with urination and not feeling well.    Objective: Vitals:   10/03/23 0536 10/03/23 0800 10/03/23 1311 10/03/23 1332  BP: (!) 115/51 (!) 106/44 91/81 (!) 99/47  Pulse: 66 71 67 69  Resp: 16 16 18 18   Temp: 97.7 F (36.5 C)  97.8 F (36.6 C)   TempSrc:   Oral   SpO2: 100% 100% 97%   Weight:      Height:        Intake/Output Summary (Last 24 hours) at 10/03/2023 1345 Last data filed at 10/02/2023 1846 Gross per 24 hour  Intake 240 ml  Output 1 ml  Net 239  ml   Filed Weights   10/01/23 1205 10/01/23 1956  Weight: 104.3 kg 74.2 kg   Examination:  General exam: Appears chronically ill; calm and comfortable  Respiratory system: Clear to auscultation. Respiratory effort normal. Cardiovascular system: normal S1 & S2 heard. No JVD, murmurs, rubs, gallops or clicks. No pedal edema. Gastrointestinal system: Abdomen is nondistended, soft and nontender. No  organomegaly or masses felt. Normal bowel sounds heard. Central nervous system: Alert and oriented. No focal neurological deficits. Extremities: Symmetric 5 x 5 power. Skin: No rashes, lesions or ulcers. Psychiatry: Judgement and insight appear normal. Mood & affect appropriate.   Data Reviewed: I have personally reviewed following labs and imaging studies  CBC: Recent Labs  Lab 10/01/23 1337 10/02/23 0356 10/03/23 0418  WBC 9.5 8.3 5.8  HGB 12.7 9.7* 10.5*  HCT 40.8 33.6* 33.9*  MCV 89.3 92.8 90.2  PLT 452* 365 313    Basic Metabolic Panel: Recent Labs  Lab 10/01/23 1337 10/02/23 0356 10/03/23 0418  NA 128* 132* 128*  K 3.3* 3.7 4.0  CL 87* 90* 89*  CO2 25 19* 21*  GLUCOSE 163* 220* 224*  BUN 34* 44* 55*  CREATININE 4.37* 4.89* 6.35*  CALCIUM  9.6 8.6* 8.2*  MG  --   --  2.4  PHOS  --   --  5.3*    CBG: Recent Labs  Lab 10/02/23 1124 10/02/23 1639 10/02/23 2016 10/03/23 0808 10/03/23 1113  GLUCAP 281* 301* 257* 264* 233*    Recent Results (from the past 240 hours)  Culture, blood (Routine X 2) w Reflex to ID Panel     Status: None (Preliminary result)   Collection Time: 10/01/23  2:54 PM   Specimen: BLOOD  Result Value Ref Range Status   Specimen Description BLOOD BLOOD LEFT ARM  Final   Special Requests   Final    BOTTLES DRAWN AEROBIC ONLY Blood Culture results may not be optimal due to an inadequate volume of blood received in culture bottles   Culture   Final    NO GROWTH 2 DAYS Performed at [facility], [address] [place], [place] [address]    Report Status PENDING  Incomplete  Culture, blood (Routine X 2) w Reflex to ID Panel     Status: None (Preliminary result)   Collection Time: 10/01/23  2:54 PM   Specimen: BLOOD  Result Value Ref Range Status   Specimen Description BLOOD BLOOD RIGHT ARM  Final   Special Requests   Final    BOTTLES DRAWN AEROBIC ONLY Blood Culture results may not be optimal due to an inadequate volume of blood  received in culture bottles   Culture   Final    NO GROWTH 2 DAYS Performed at [facility], [address], [place], [place] [address]    Report Status PENDING  Incomplete  MRSA Next Gen by PCR, Nasal     Status: None   Collection Time: 10/02/23  3:52 AM   Specimen: Nasal Mucosa; Nasal Swab  Result Value Ref Range Status   MRSA by PCR Next Gen NOT DETECTED NOT DETECTED Final    Comment: (NOTE) The GeneXpert MRSA Assay (FDA approved for NASAL specimens only), is one component of a comprehensive MRSA colonization surveillance program. It is not intended to diagnose MRSA infection nor to guide or monitor treatment for MRSA infections. Test performance is not FDA approved in patients less than 3 years old. Performed at [facility], [address], [place], [place] [address]  Radiology Studies: CT ABDOMEN PELVIS W CONTRAST Result Date: 10/01/2023 CLINICAL DATA:  Altered mental status.  Weakness. EXAM: CT ABDOMEN AND PELVIS WITH CONTRAST TECHNIQUE: Multidetector CT imaging of the abdomen and pelvis was performed using the standard protocol following bolus administration of intravenous contrast. RADIATION DOSE REDUCTION: This exam was performed according to the departmental dose-optimization program which includes automated exposure control, adjustment of the mA and/or kV according to patient size and/or use of iterative reconstruction technique. CONTRAST:  OMNIPAQUE  IOHEXOL  300 MG/ML  SOLN COMPARISON:  August 09, 2023. FINDINGS: Lower chest: Small right pleural effusion is noted. Minimal bilateral posterior basilar subsegmental atelectasis is noted. Hepatobiliary: Cholelithiasis is noted. No biliary dilatation is noted. Multiple small calcified granulomas are noted in the hepatic parenchyma. Pancreas: Unremarkable. No pancreatic ductal dilatation or surrounding inflammatory changes. Spleen: Multiple small calcified granulomas are noted. Adrenals/Urinary Tract: Adrenal glands appear normal.  Bilateral renal cortical scarring is noted. Right renal cyst is noted. No hydronephrosis or renal obstruction is noted. Urinary bladder is decompressed. Stomach/Bowel: Stomach is unremarkable. There is no evidence of bowel obstruction. The appendix appears normal. Sigmoid diverticulosis is noted without inflammation. Moderate amount of stool seen in the rectum concerning for impaction. Vascular/Lymphatic: Aortic atherosclerosis. No enlarged abdominal or pelvic lymph nodes. Reproductive: Status post hysterectomy. No adnexal masses. Other: No ascites or hernia. Musculoskeletal: No acute or significant osseous findings. IMPRESSION: Small right pleural effusion. Cholelithiasis. Bilateral renal cortical scarring. Sigmoid diverticulosis without inflammation. Moderate amount of stool seen in rectum concerning for impaction. Aortic Atherosclerosis (ICD10-I70.0). Electronically Signed   By: Rosalene Colon M.D.   On: 10/01/2023 16:07   CT HEAD WO CONTRAST ( ) Result Date: 10/01/2023 CLINICAL DATA:  Delirium. EXAM: CT HEAD WITHOUT CONTRAST TECHNIQUE: Contiguous axial images were obtained from the base of the skull through the vertex without intravenous contrast. RADIATION DOSE REDUCTION: This exam was performed according to the departmental dose-optimization program which includes automated exposure control, adjustment of the mA and/or kV according to patient size and/or use of iterative reconstruction technique. COMPARISON:  Head CT 01/15/2022 FINDINGS: Brain: There is no evidence of an acute infarct, intracranial hemorrhage, mass, midline shift, or extra-axial fluid collection. There is mild cerebral atrophy. Patchy cerebral white matter hypodensities have likely mildly progressed and are nonspecific but compatible with moderate chronic small vessel ischemic disease. 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 Vascular: Calcified atherosclerosis at the skull base. No hyperdense vessel. Skull: No fracture or  suspicious lesion. Sinuses/Orbits: Visualized paranasal sinuses and mastoid air cells are clear. Bilateral cataract extraction. Other: None. IMPRESSION: 1. No evidence of acute intracranial abnormality. 2. Moderate chronic small vessel ischemic disease. Electronically Signed   By: Aundra [name] M.D.   On: 10/01/2023 15:59   DG Chest Port 1 View Result Date: 10/01/2023 CLINICAL DATA:  Hypoxia. EXAM: PORTABLE CHEST 1 VIEW COMPARISON:  08/13/2023. FINDINGS: Stable cardiomegaly. Right IJ dialysis catheter tip overlies the right atrium, unchanged. Aortic atherosclerosis. Central pulmonary vascular congestion with bilateral interstitial prominence, most pronounced at the lung bases, which could reflect interstitial pulmonary edema. Possible small left pleural effusion. No pneumothorax. No acute osseous abnormality. IMPRESSION: 1. Cardiomegaly with central pulmonary vascular congestion and findings suggestive of interstitial pulmonary edema. 2. Possible small left pleural effusion. Electronically Signed   By: Mannie Seek M.D.   On: 10/01/2023 13:59    Scheduled Meds:  amiodarone   200 mg Oral Daily   apixaban   5 mg Oral BID   atorvastatin   80 mg Oral Daily  calcitRIOL   2.25 mcg Oral Q T,Th,Sa-HD   Chlorhexidine  Gluconate Cloth  6 each Topical Q0600   clonazePAM   0.5 mg Oral BID   folic acid   1 mg Oral Daily   insulin  aspart  0-6 Units Subcutaneous TID WC   insulin  aspart  10 Units Subcutaneous TID WC   insulin  glargine-yfgn  30 Units Subcutaneous Daily   levothyroxine   50 mcg Oral QAC breakfast   melatonin  3 mg Oral QHS   pantoprazole   40 mg Oral Daily   polyethylene glycol  17 g Oral Daily   sevelamer  carbonate  1,600 mg Oral TID WC   venlafaxine  XR  75 mg Oral Q breakfast   Continuous Infusions:  meropenem  (MERREM ) IV Stopped (10/02/23 1830)    LOS: 2 days   Time spent: 55 mins  Araf Clugston Lincoln Renshaw, MD How to contact the [facility] Attending or Consulting provider 7A - 7P or covering provider  during after hours 7P -7A, for this patient?  Check the care team in [facility] and look for a) attending/consulting TRH provider listed and b) the TRH team listed Log into [email or web address] to find provider on call.  Locate the TRH provider you are looking for under [facility] and page to a number that you can be directly reached. If you still have difficulty reaching the provider, please page the [facility] (Director on Call) for the Hospitalists listed on amion for assistance.  10/03/2023, 1:45 PM

## 2023-10-03 NOTE — Plan of Care (Signed)

## 2023-10-04 DIAGNOSIS — N186 End stage renal disease: Secondary | ICD-10-CM | POA: Diagnosis not present

## 2023-10-04 DIAGNOSIS — I10 Essential (primary) hypertension: Secondary | ICD-10-CM | POA: Diagnosis not present

## 2023-10-04 DIAGNOSIS — N3001 Acute cystitis with hematuria: Secondary | ICD-10-CM | POA: Diagnosis not present

## 2023-10-04 DIAGNOSIS — I482 Chronic atrial fibrillation, unspecified: Secondary | ICD-10-CM | POA: Diagnosis not present

## 2023-10-04 LAB — GLUCOSE, CAPILLARY
Glucose-Capillary: 307 mg/dL — ABNORMAL HIGH (ref 70–99)
Glucose-Capillary: 293 mg/dL — ABNORMAL HIGH (ref 70–99)
Glucose-Capillary: 293 mg/dL — ABNORMAL HIGH (ref 70–99)
Glucose-Capillary: 123 mg/dL — ABNORMAL HIGH (ref 70–99)
Glucose-Capillary: 372 mg/dL — ABNORMAL HIGH (ref 70–99)

## 2023-10-04 LAB — CBC
HCT: 33.2 % — ABNORMAL LOW (ref 36.0–46.0)
Hemoglobin: 9.8 g/dL — ABNORMAL LOW (ref 12.0–15.0)
MCH: 26.6 pg (ref 26.0–34.0)
MCHC: 29.5 g/dL — ABNORMAL LOW (ref 30.0–36.0)
MCV: 90.2 fL (ref 80.0–100.0)
Platelets: 274 10*3/uL (ref 150–400)
RBC: 3.68 MIL/uL — ABNORMAL LOW (ref 3.87–5.11)
RDW: 17.5 % — ABNORMAL HIGH (ref 11.5–15.5)
WBC: 5.4 10*3/uL (ref 4.0–10.5)
nRBC: 0 % (ref 0.0–0.2)

## 2023-10-04 LAB — RENAL FUNCTION PANEL
Albumin: 2.6 g/dL — ABNORMAL LOW (ref 3.5–5.0)
Anion gap: 10 (ref 5–15)
BUN: 27 mg/dL — ABNORMAL HIGH (ref 8–23)
CO2: 26 mmol/L (ref 22–32)
Calcium: 8.4 mg/dL — ABNORMAL LOW (ref 8.9–10.3)
Chloride: 95 mmol/L — ABNORMAL LOW (ref 98–111)
Creatinine, Ser: 3.71 mg/dL — ABNORMAL HIGH (ref 0.44–1.00)
GFR, Estimated: 12 mL/min — ABNORMAL LOW (ref >60)
Glucose, Bld: 295 mg/dL — ABNORMAL HIGH (ref 70–99)
Phosphorus: 3.1 mg/dL (ref 2.5–4.6)
Potassium: 3.7 mmol/L (ref 3.5–5.1)
Sodium: 131 mmol/L — ABNORMAL LOW (ref 135–145)

## 2023-10-04 MED ORDER — ZINC OXIDE 40 % EX OINT
TOPICAL_OINTMENT | Freq: Two times a day (BID) | CUTANEOUS | Status: DC
  Administered 2023-10-06: 1 via TOPICAL
  Filled 2023-10-04: qty 57

## 2023-10-04 MED ORDER — SODIUM CHLORIDE 0.9 % IV SOLN
1.0000 g | Freq: Once | INTRAVENOUS | Status: AC
  Administered 2023-10-04: 1 g via INTRAVENOUS
  Filled 2023-10-04: qty 20

## 2023-10-04 MED ORDER — INSULIN GLARGINE-YFGN 100 UNIT/ML ~~LOC~~ SOLN
35.0000 [IU] | Freq: Every day | SUBCUTANEOUS | Status: DC
Start: 2023-10-04 — End: 2023-10-05
  Administered 2023-10-04 – 2023-10-05 (×2): 35 [IU] via SUBCUTANEOUS
  Filled 2023-10-04 (×3): qty 0.35

## 2023-10-04 MED ORDER — METHOCARBAMOL 500 MG PO TABS
750.0000 mg | ORAL_TABLET | Freq: Four times a day (QID) | ORAL | Status: DC | PRN
  Administered 2023-10-04: 750 mg via ORAL
  Filled 2023-10-04: qty 2

## 2023-10-04 NOTE — Progress Notes (Signed)
 Pt complained of uncomfortable abdomen cramping due to constipation. Administered prn Lactulose .

## 2023-10-04 NOTE — Plan of Care (Signed)
  Problem: Health Behavior/Discharge Planning: Goal: Ability to manage health-related needs will improve Outcome: Progressing   Problem: Clinical Measurements: Goal: Ability to maintain clinical measurements within normal limits will improve Outcome: Progressing Goal: Will remain free from infection Outcome: Progressing Goal: Diagnostic test results will improve Outcome: Progressing   Problem: Activity: Goal: Risk for activity intolerance will decrease Outcome: Progressing   Problem: Nutrition: Goal: Adequate nutrition will be maintained Outcome: Progressing   Problem: Coping: Goal: Level of anxiety will decrease Outcome: Progressing   Problem: Elimination: Goal: Will not experience complications related to bowel motility Outcome: Progressing Goal: Will not experience complications related to urinary retention Outcome: Progressing   Problem: Pain Managment: Goal: General experience of comfort will improve and/or be controlled Outcome: Progressing   Problem: Safety: Goal: Ability to remain free from injury will improve Outcome: Progressing   Problem: Skin Integrity: Goal: Risk for impaired skin integrity will decrease Outcome: Progressing

## 2023-10-04 NOTE — Progress Notes (Signed)
 PROGRESS NOTE   SHIRLY GRAZIANO  UJW:[id number] DOB: 05/05/1951 DOA: 10/01/2023 PCP: Austine Lefort, MD   Chief Complaint  Patient presents with   Altered Mental Status   Level of care: Med-Surg  Brief Admission History:  73 y.o. female, with PMH significant for ESRD-HD-TTS, PAF on Eliquis , CAD status post PCI, chronic systolic CHF with EF 30-35%, DM2, HTN, HLD, hypothyroidism, anemia of CKD, right BKA, and depression/anxiety with recent hospitalization on 3/12, ESBL UTI and bacteremia.  The right subclavian HD catheter exchanged during that admission.   -Pt was sent from HD center for evaluation, patient reports she has not been feeling well, having nausea, occasional vomiting, poor appetite, constipation, as well she does endorse dysuria, was found to be very sick at dialysis center, so was sent to ED for evaluation. - In ED, her UA was positive, she does endorse dysuria, was positive, no leukocytosis, afebrile, sodium was low at 128, potassium at 3.3, CT abdomen pelvis with diverticulosis, and constipation, treated hospitalist consulted to admit.   Assessment and Plan:  UTI - Patient with known history of ESBL UTI and bacteremia in the past, she presents with dysuria, had leukocytosis - UA is positive - Continue with meropenem  for 1 more dose to complete treatment  - plan to discharge home tomorrow 5/12    ESRD-HD-TTS -She missed her HD as she was sent from dialysis center due to her illness. - Location for emergency dialysis overnight, renal consult requested in epic to dialyze tomorrow. - Patient right subclavian hemodialysis catheter was changed out by IR 3/17 because of bacteremia during previous admission - HD treatment was done on 5/10   Chronic hyponatremia -At baseline, management with dialysis   Hypokalemia -management with dialysis   Chronic hypotension Chronic systolic CHF -Continue with home midodrine  before dialysis days -No significant evidence of volume  overload currently, so can wait for dialysis till tomorrow -Most recent echo from April 2024 with EF 30 to 35%, moderate to severe LV dysfunction   PAF  Heart rate controlled with amiodarone  Chronically anticoagulated with apixaban     CAD, HLD Apixaban  and statin   Type 2 diabetes mellitus uncontrolled with hyperglycemia - Check A1c 6.8%  - increased to 35 units of Semglee  and novolog  10 units TID with meals, plus insulin  sliding scale  CBG (last 3)  Recent Labs    10/03/23 2227 10/04/23 0331 10/04/23 0734  GLUCAP 192* 307* 293*   Anemia of CKD Hemoglobin at baseline, management per renal   Hypothyroidism Continue Synthroid    Depression/anxiety Continue Klonopin , melatonin nightly, Effexor  daily   Constipation concern for fecal impaction -started on bowel regimen -added molasses enema treatment  -she has been having bowel movement   DVT prophylaxis: apixaban   Code Status: Full  Family Communication:  Disposition: anticipate DC home tomorrow 10/04/23  Consultants:  Nephrology   Procedures:   Antimicrobials:  Meropenem  5/8>>   Subjective: Says just a little burning with urination today, felt really "tired like a soggy dishrag" after HD yesterday   Objective: Vitals:   10/03/23 1700 10/03/23 1730 10/03/23 1745 10/03/23 2141  BP: (!) 119/54 122/65 122/61 119/63  Pulse: 68 69 68 78  Resp: 14 15 18 16   Temp:   98 F (36.7 C) 98.2 F (36.8 C)  TempSrc:   Oral Oral  SpO2:   100% 100%  Weight:   73.7 kg   Height:        Intake/Output Summary (Last 24 hours) at 10/04/2023 1009 Last data  filed at 10/03/2023 1745 Gross per 24 hour  Intake 240 ml  Output 2000 ml  Net -1760 ml   Filed Weights   10/01/23 1956 10/03/23 1311 10/03/23 1745  Weight: 74.2 kg 74.8 kg 73.7 kg   Examination:  General exam: Appears chronically ill; calm and comfortable  Respiratory system: Clear to auscultation. Respiratory effort normal. Cardiovascular system: normal S1 & S2  heard. No JVD, murmurs, rubs, gallops or clicks. No pedal edema. Gastrointestinal system: Abdomen is nondistended, soft and nontender. No organomegaly or masses felt. Normal bowel sounds heard. Central nervous system: Alert and oriented. No focal neurological deficits. Extremities: Symmetric 5 x 5 power. Skin: No rashes, lesions or ulcers. Psychiatry: Judgement and insight appear normal. Mood & affect appropriate.   Data Reviewed: I have personally reviewed following labs and imaging studies  CBC: Recent Labs  Lab 10/01/23 1337 10/02/23 0356 10/03/23 0418 10/04/23 0417  WBC 9.5 8.3 5.8 5.4  HGB 12.7 9.7* 10.5* 9.8*  HCT 40.8 33.6* 33.9* 33.2*  MCV 89.3 92.8 90.2 90.2  PLT 452* 365 313 274    Basic Metabolic Panel: Recent Labs  Lab 10/01/23 1337 10/02/23 0356 10/03/23 0418 10/04/23 0417  NA 128* 132* 128* 131*  K 3.3* 3.7 4.0 3.7  CL 87* 90* 89* 95*  CO2 25 19* 21* 26  GLUCOSE 163* 220* 224* 295*  BUN 34* 44* 55* 27*  CREATININE 4.37* 4.89* 6.35* 3.71*  CALCIUM  9.6 8.6* 8.2* 8.4*  MG  --   --  2.4  --   PHOS  --   --  5.3* 3.1    CBG: Recent Labs  Lab 10/03/23 1113 10/03/23 1645 10/03/23 2227 10/04/23 0331 10/04/23 0734  GLUCAP 233* 77 192* 307* 293*    Recent Results (from the past 240 hours)  Culture, blood (Routine X 2) w Reflex to ID Panel     Status: None (Preliminary result)   Collection Time: 10/01/23  2:54 PM   Specimen: BLOOD  Result Value Ref Range Status   Specimen Description BLOOD BLOOD LEFT ARM  Final   Special Requests   Final    BOTTLES DRAWN AEROBIC ONLY Blood Culture results may not be optimal due to an inadequate volume of blood received in culture bottles   Culture   Final    NO GROWTH 3 DAYS Performed at [facility], [address] [place], [place] [address]    Report Status PENDING  Incomplete  Culture, blood (Routine X 2) w Reflex to ID Panel     Status: None (Preliminary result)   Collection Time: 10/01/23  2:54 PM    Specimen: BLOOD  Result Value Ref Range Status   Specimen Description BLOOD BLOOD RIGHT ARM  Final   Special Requests   Final    BOTTLES DRAWN AEROBIC ONLY Blood Culture results may not be optimal due to an inadequate volume of blood received in culture bottles   Culture   Final    NO GROWTH 3 DAYS Performed at [facility], [address] [place], [place] [address]    Report Status PENDING  Incomplete  MRSA Next Gen by PCR, Nasal     Status: None   Collection Time: 10/02/23  3:52 AM   Specimen: Nasal Mucosa; Nasal Swab  Result Value Ref Range Status   MRSA by PCR Next Gen NOT DETECTED NOT DETECTED Final    Comment: (NOTE) The GeneXpert MRSA Assay (FDA approved for NASAL specimens only), is one component of a comprehensive MRSA colonization  surveillance program. It is not intended to diagnose MRSA infection nor to guide or monitor treatment for MRSA infections. Test performance is not FDA approved in patients less than 49 years old. Performed at [facility], [address], [place], [place] [address]     Radiology Studies: No results found.  Scheduled Meds:  amiodarone   200 mg Oral Daily   apixaban   5 mg Oral BID   atorvastatin   80 mg Oral Daily   Chlorhexidine  Gluconate Cloth  6 each Topical Q0600   clonazePAM   0.5 mg Oral BID   doxercalciferol  4 mcg Intravenous Q T,Th,Sa-HD   folic acid   1 mg Oral Daily   insulin  aspart  0-6 Units Subcutaneous TID WC   insulin  aspart  10 Units Subcutaneous TID WC   insulin  glargine-yfgn  35 Units Subcutaneous Daily   levothyroxine   50 mcg Oral QAC breakfast   melatonin  3 mg Oral QHS   pantoprazole   40 mg Oral Daily   polyethylene glycol  17 g Oral Daily   sevelamer  carbonate  1,600 mg Oral TID WC   venlafaxine  XR  75 mg Oral Q breakfast   Continuous Infusions:  meropenem  (MERREM ) IV      LOS: 3 days   Time spent: 56 mins  Kailynn Satterly Lincoln Renshaw, MD How to contact the [facility] Attending or Consulting provider 7A - 7P or covering provider  during after hours 7P -7A, for this patient?  Check the care team in [facility] and look for a) attending/consulting TRH provider listed and b) the TRH team listed Log into [email or web address] to find provider on call.  Locate the TRH provider you are looking for under [facility] and page to a number that you can be directly reached. If you still have difficulty reaching the provider, please page the [facility] (Director on Call) for the Hospitalists listed on amion for assistance.  10/04/2023, 10:09 AM

## 2023-10-04 NOTE — Plan of Care (Signed)
  Problem: Health Behavior/Discharge Planning: Goal: Ability to manage health-related needs will improve Outcome: Progressing   Problem: Clinical Measurements: Goal: Ability to maintain clinical measurements within normal limits will improve Outcome: Progressing Goal: Will remain free from infection Outcome: Progressing Goal: Diagnostic test results will improve Outcome: Progressing   Problem: Activity: Goal: Risk for activity intolerance will decrease Outcome: Progressing   Problem: Nutrition: Goal: Adequate nutrition will be maintained Outcome: Progressing   Problem: Coping: Goal: Level of anxiety will decrease Outcome: Progressing   Problem: Elimination: Goal: Will not experience complications related to bowel motility Outcome: Progressing Goal: Will not experience complications related to urinary retention Outcome: Progressing   Problem: Pain Managment: Goal: General experience of comfort will improve and/or be controlled Outcome: Progressing   Problem: Safety: Goal: Ability to remain free from injury will improve Outcome: Progressing   Problem: Skin Integrity: Goal: Risk for impaired skin integrity will decrease Outcome: Progressing   Problem: Coping: Goal: Ability to adjust to condition or change in health will improve Outcome: Progressing   Problem: Fluid Volume: Goal: Ability to maintain a balanced intake and output will improve Outcome: Progressing   Problem: Health Behavior/Discharge Planning: Goal: Ability to identify and utilize available resources and services will improve Outcome: Progressing Goal: Ability to manage health-related needs will improve Outcome: Progressing   Problem: Metabolic: Goal: Ability to maintain appropriate glucose levels will improve Outcome: Progressing   Problem: Nutritional: Goal: Maintenance of adequate nutrition will improve Outcome: Progressing Goal: Progress toward achieving an optimal weight will  improve Outcome: Progressing   Problem: Skin Integrity: Goal: Risk for impaired skin integrity will decrease Outcome: Progressing   Problem: Tissue Perfusion: Goal: Adequacy of tissue perfusion will improve Outcome: Progressing

## 2023-10-05 ENCOUNTER — Inpatient Hospital Stay (HOSPITAL_COMMUNITY)

## 2023-10-05 ENCOUNTER — Encounter: Admitting: Physical Therapy

## 2023-10-05 DIAGNOSIS — I482 Chronic atrial fibrillation, unspecified: Secondary | ICD-10-CM | POA: Diagnosis not present

## 2023-10-05 DIAGNOSIS — N186 End stage renal disease: Secondary | ICD-10-CM | POA: Diagnosis not present

## 2023-10-05 DIAGNOSIS — K5909 Other constipation: Secondary | ICD-10-CM

## 2023-10-05 DIAGNOSIS — I10 Essential (primary) hypertension: Secondary | ICD-10-CM | POA: Diagnosis not present

## 2023-10-05 DIAGNOSIS — N3001 Acute cystitis with hematuria: Secondary | ICD-10-CM | POA: Diagnosis not present

## 2023-10-05 LAB — RENAL FUNCTION PANEL
Albumin: 2.7 g/dL — ABNORMAL LOW (ref 3.5–5.0)
Anion gap: 10 (ref 5–15)
BUN: 42 mg/dL — ABNORMAL HIGH (ref 8–23)
CO2: 26 mmol/L (ref 22–32)
Calcium: 8.8 mg/dL — ABNORMAL LOW (ref 8.9–10.3)
Chloride: 94 mmol/L — ABNORMAL LOW (ref 98–111)
Creatinine, Ser: 4.74 mg/dL — ABNORMAL HIGH (ref 0.44–1.00)
GFR, Estimated: 9 mL/min — ABNORMAL LOW (ref >60)
Glucose, Bld: 94 mg/dL (ref 70–99)
Phosphorus: 3.6 mg/dL (ref 2.5–4.6)
Potassium: 4.1 mmol/L (ref 3.5–5.1)
Sodium: 130 mmol/L — ABNORMAL LOW (ref 135–145)

## 2023-10-05 LAB — CBC
HCT: 36.3 % (ref 36.0–46.0)
Hemoglobin: 11 g/dL — ABNORMAL LOW (ref 12.0–15.0)
MCH: 27.5 pg (ref 26.0–34.0)
MCHC: 30.3 g/dL (ref 30.0–36.0)
MCV: 90.8 fL (ref 80.0–100.0)
Platelets: 230 10*3/uL (ref 150–400)
RBC: 4 MIL/uL (ref 3.87–5.11)
RDW: 17.8 % — ABNORMAL HIGH (ref 11.5–15.5)
WBC: 6.1 10*3/uL (ref 4.0–10.5)
nRBC: 0 % (ref 0.0–0.2)

## 2023-10-05 LAB — GLUCOSE, CAPILLARY
Glucose-Capillary: 89 mg/dL (ref 70–99)
Glucose-Capillary: 94 mg/dL (ref 70–99)
Glucose-Capillary: 100 mg/dL — ABNORMAL HIGH (ref 70–99)
Glucose-Capillary: 105 mg/dL — ABNORMAL HIGH (ref 70–99)
Glucose-Capillary: 42 mg/dL — CL (ref 70–99)
Glucose-Capillary: 70 mg/dL (ref 70–99)
Glucose-Capillary: 68 mg/dL — ABNORMAL LOW (ref 70–99)
Glucose-Capillary: 82 mg/dL (ref 70–99)

## 2023-10-05 MED ORDER — INSULIN GLARGINE-YFGN 100 UNIT/ML ~~LOC~~ SOLN
25.0000 [IU] | Freq: Every day | SUBCUTANEOUS | Status: DC
  Filled 2023-10-05 (×2): qty 0.25

## 2023-10-05 MED ORDER — MILK AND MOLASSES ENEMA: 1.0000 | Freq: Once | RECTAL | Status: DC

## 2023-10-05 MED ORDER — DEXTROSE 50 % IV SOLN
INTRAVENOUS | Status: AC
  Filled 2023-10-05: qty 50

## 2023-10-05 MED ORDER — INSULIN ASPART 100 UNIT/ML IJ SOLN: 6.0000 [IU] | Freq: Three times a day (TID) | INTRAMUSCULAR | Status: DC

## 2023-10-05 NOTE — Progress Notes (Signed)
 PROGRESS NOTE   Susan [name]  WJX:[id number] DOB: 1950-09-26 DOA: 10/01/2023 PCP: Austine Lefort, MD   Chief Complaint  Patient presents with   Altered Mental Status   Level of care: Med-Surg  Brief Admission History:  73 y.o. female, with PMH significant for ESRD-HD-TTS, PAF on Eliquis , CAD status post PCI, chronic systolic CHF with EF 30-35%, DM2, HTN, HLD, hypothyroidism, anemia of CKD, right BKA, and depression/anxiety with recent hospitalization on 3/12, ESBL UTI and bacteremia.  The right subclavian HD catheter exchanged during that admission.   -Pt was sent from HD center for evaluation, patient reports she has not been feeling well, having nausea, occasional vomiting, poor appetite, constipation, as well she does endorse dysuria, was found to be very sick at dialysis center, so was sent to ED for evaluation. - In ED, her UA was positive, she does endorse dysuria, was positive, no leukocytosis, afebrile, sodium was low at 128, potassium at 3.3, CT abdomen pelvis with diverticulosis, and constipation, treated hospitalist consulted to admit.   Assessment and Plan:  UTI - Patient with known history of ESBL UTI and bacteremia in the past, she presents with dysuria, had leukocytosis - UA is positive - Continue with meropenem  for 1 more dose to complete treatment  - DC held due to issues with constipation today    ESRD-HD-TTS -She missed her HD as she was sent from dialysis center due to her illness. - Location for emergency dialysis overnight, renal consult requested in epic to dialyze tomorrow. - Patient right subclavian hemodialysis catheter was changed out by IR 3/17 because of bacteremia during previous admission - HD treatment was done on 5/10 - next treatment 5/13 per nephrology team    Chronic hyponatremia -At baseline, management with dialysis   Hypokalemia -management with dialysis   Chronic hypotension Chronic systolic CHF -Continue with home midodrine  before  dialysis days -No significant evidence of volume overload currently, so can wait for dialysis till tomorrow -Most recent echo from April 2024 with EF 30 to 35%, moderate to severe LV dysfunction   PAF  Heart rate controlled with amiodarone  Chronically anticoagulated with apixaban     CAD, HLD Apixaban  and statin   Type 2 diabetes mellitus uncontrolled with hyperglycemia - Check A1c 6.8%  - increased to 35 units of Semglee  and novolog  10 units TID with meals, plus insulin  sliding scale  CBG (last 3)  Recent Labs    10/05/23 0359 10/05/23 0714 10/05/23 1133  GLUCAP 94 100* 105*   Anemia of CKD Hemoglobin at baseline, management per renal   Hypothyroidism Continue Synthroid    Depression/anxiety Continue Klonopin , melatonin nightly, Effexor  daily   Constipation concern for fecal impaction -started on bowel regimen -added another molasses enema treatment today -check portable abd xrays -continue miralax , prn lactulose    DVT prophylaxis: apixaban   Code Status: Full  Family Communication:  Disposition: anticipate DC home tomorrow   Consultants:  Nephrology   Procedures:   Antimicrobials:  Meropenem  5/8>>   Subjective: Pt says she is still very constipated and it is causing her to have an upset and distended abdomen, says she is willing to have another enema today   Objective: Vitals:   10/04/23 1303 10/04/23 2019 10/05/23 0403 10/05/23 1042  BP: 119/63 (!) 117/56 133/77 (!) 103/56  Pulse: 73 82 82 73  Resp: 17 20  19   Temp: 98.7 F (37.1 C) 98.2 F (36.8 C) 98.4 F (36.9 C) (!) 97.5 F (36.4 C)  TempSrc: Oral Oral Oral Oral  SpO2: 100% 100% 100% 100%  Weight:      Height:        Intake/Output Summary (Last 24 hours) at 10/05/2023 1207 Last data filed at 10/04/2023 1230 Gross per 24 hour  Intake 240 ml  Output --  Net 240 ml   Filed Weights   10/01/23 1956 10/03/23 1311 10/03/23 1745  Weight: 74.2 kg 74.8 kg 73.7 kg   Examination:  General exam:  Appears chronically ill; calm and comfortable  Respiratory system: Clear to auscultation. Respiratory effort normal. Cardiovascular system: normal S1 & S2 heard. No JVD, murmurs, rubs, gallops or clicks. No pedal edema. Gastrointestinal system: Abdomen is nondistended, soft and nontender. No organomegaly or masses felt. Normal bowel sounds heard. Central nervous system: Alert and oriented. No focal neurological deficits. Extremities: Symmetric 5 x 5 power. Skin: No rashes, lesions or ulcers. Psychiatry: Judgement and insight appear normal. Mood & affect appropriate.   Data Reviewed: I have personally reviewed following labs and imaging studies  CBC: Recent Labs  Lab 10/01/23 1337 10/02/23 0356 10/03/23 0418 10/04/23 0417 10/05/23 0438  WBC 9.5 8.3 5.8 5.4 6.1  HGB 12.7 9.7* 10.5* 9.8* 11.0*  HCT 40.8 33.6* 33.9* 33.2* 36.3  MCV 89.3 92.8 90.2 90.2 90.8  PLT 452* 365 313 274 230    Basic Metabolic Panel: Recent Labs  Lab 10/01/23 1337 10/02/23 0356 10/03/23 0418 10/04/23 0417 10/05/23 0438  NA 128* 132* 128* 131* 130*  K 3.3* 3.7 4.0 3.7 4.1  CL 87* 90* 89* 95* 94*  CO2 25 19* 21* 26 26  GLUCOSE 163* 220* 224* 295* 94  BUN 34* 44* 55* 27* 42*  CREATININE 4.37* 4.89* 6.35* 3.71* 4.74*  CALCIUM  9.6 8.6* 8.2* 8.4* 8.8*  MG  --   --  2.4  --   --   PHOS  --   --  5.3* 3.1 3.6    CBG: Recent Labs  Lab 10/04/23 2014 10/05/23 0310 10/05/23 0359 10/05/23 0714 10/05/23 1133  GLUCAP 372* 89 94 100* 105*    Recent Results (from the past 240 hours)  Culture, blood (Routine X 2) w Reflex to ID Panel     Status: None (Preliminary result)   Collection Time: 10/01/23  2:54 PM   Specimen: BLOOD  Result Value Ref Range Status   Specimen Description BLOOD BLOOD LEFT ARM  Final   Special Requests   Final    BOTTLES DRAWN AEROBIC ONLY Blood Culture results may not be optimal due to an inadequate volume of blood received in culture bottles   Culture   Final    NO GROWTH 4  DAYS Performed at [facility], [address], [place], [place] [address]    Report Status PENDING  Incomplete  Culture, blood (Routine X 2) w Reflex to ID Panel     Status: None (Preliminary result)   Collection Time: 10/01/23  2:54 PM   Specimen: BLOOD  Result Value Ref Range Status   Specimen Description BLOOD BLOOD RIGHT ARM  Final   Special Requests   Final    BOTTLES DRAWN AEROBIC ONLY Blood Culture results may not be optimal due to an inadequate volume of blood received in culture bottles   Culture   Final    NO GROWTH 4 DAYS Performed at [facility], [address], [place], [place] [address]    Report Status PENDING  Incomplete  MRSA Next Gen by PCR, Nasal     Status: None   Collection Time: 10/02/23  3:52 AM   Specimen: Nasal Mucosa; Nasal Swab  Result Value Ref Range Status   MRSA by PCR Next Gen NOT DETECTED NOT DETECTED Final    Comment: (NOTE) The GeneXpert MRSA Assay (FDA approved for NASAL specimens only), is one component of a comprehensive MRSA colonization surveillance program. It is not intended to diagnose MRSA infection nor to guide or monitor treatment for MRSA infections. Test performance is not FDA approved in patients less than 30 years old. Performed at [facility], [address] [place], [place] [address]     Radiology Studies: No results found.  Scheduled Meds:  amiodarone   200 mg Oral Daily   apixaban   5 mg Oral BID   atorvastatin   80 mg Oral Daily   Chlorhexidine  Gluconate Cloth  6 each Topical Q0600   clonazePAM   0.5 mg Oral BID   doxercalciferol  4 mcg Intravenous Q T,Th,Sa-HD   folic acid   1 mg Oral Daily   insulin  aspart  0-6 Units Subcutaneous TID WC   insulin  aspart  10 Units Subcutaneous TID WC   insulin  glargine-yfgn  35 Units Subcutaneous Daily   levothyroxine   50 mcg Oral QAC breakfast   liver oil-zinc  oxide   Topical BID   melatonin  3 mg Oral QHS   milk and molasses  1 enema Rectal Once   pantoprazole   40 mg Oral Daily    polyethylene glycol  17 g Oral Daily   sevelamer  carbonate  1,600 mg Oral TID WC   venlafaxine  XR  75 mg Oral Q breakfast   Continuous Infusions:   LOS: 4 days   Time spent: 55 mins  Kyonna Frier Lincoln Renshaw, MD How to contact the [facility] Attending or Consulting provider 7A - 7P or covering provider during after hours 7P -7A, for this patient?  Check the care team in [facility] and look for a) attending/consulting TRH provider listed and b) the TRH team listed Log into [email or web address] to find provider on call.  Locate the TRH provider you are looking for under [facility] and page to a number that you can be directly reached. If you still have difficulty reaching the provider, please page the [facility] (Director on Call) for the Hospitalists listed on amion for assistance.  10/05/2023, 12:07 PM

## 2023-10-05 NOTE — Progress Notes (Signed)
 Nurse at bedside,CBG recheck was 70,Dr Kellogg. Plan of care on going.

## 2023-10-05 NOTE — Evaluation (Signed)
 Physical Therapy Evaluation Patient Details Name: Susan [name] MRN: [id number] DOB: Mar 12, 1951 Today's Date: 10/05/2023  History of Present Illness  Susan [name]  is a 73 y.o. female, with PMH significant for ESRD-HD-TTS, PAF on Eliquis , CAD status post PCI, chronic systolic CHF with EF 30-35%, DM2, HTN, HLD, hypothyroidism, anemia of CKD, right BKA, and depression/anxiety with recent hospitalization on 3/12, ESBL UTI and bacteremia.  The right subclavian HD catheter exchanged during that admission  - [name] was sent from HD center for evaluation, patient reports she has not been feeling well, having nausea, occasional vomiting, poor appetite, constipation, as well she does endorse dysuria, was found to be very sick at dialysis center, so was sent to ED for evaluation..   Clinical Impression  Patient required Max assist for donning RLE BKA prosthetic leg, demonstrates slow labored movement for taking steps in room with most difficulty taking backward steps with near fall when transferring to chair. Patient is near baseline and assisted for functional activities at home by spouse. Patient tolerated sitting up in chair after therapy. Patient will benefit from continued skilled physical therapy in hospital and recommended venue below to increase strength, balance, endurance for safe ADLs and gait.          If plan is discharge home, recommend the following: A lot of help with bathing/dressing/bathroom;A lot of help with walking and/or transfers;Help with stairs or ramp for entrance;Assistance with cooking/housework   Can travel by private vehicle        Equipment Recommendations None recommended by PT  Recommendations for Other Services       Functional Status Assessment Patient has had a recent decline in their functional status and demonstrates the ability to make significant improvements in function in a reasonable and predictable amount of time.     Precautions / Restrictions  Precautions Precautions: Fall Restrictions Weight Bearing Restrictions Per Provider Order: No      Mobility  Bed Mobility Overal bed mobility: Needs Assistance Bed Mobility: Supine to Sit     Supine to sit: Min assist          Transfers Overall transfer level: Needs assistance Equipment used: Rolling walker (2 wheels) Transfers: Sit to/from Stand, Bed to chair/wheelchair/BSC Sit to Stand: Min assist   Step pivot transfers: Min assist, Mod assist       General transfer comment: unsteady labored movement with loss of balance when stepping backwards to transfer to chair    Ambulation/Gait Ambulation/Gait assistance: Min assist, Mod assist Gait Distance (Feet): 12 Feet Assistive device: Rolling walker (2 wheels) Gait Pattern/deviations: Decreased step length - right, Decreased step length - left, Decreased stride length Gait velocity: slow     General Gait Details: slow labored movement with most difficulty taking steps backwards due to loss of balance  Stairs            Wheelchair Mobility     Tilt Bed    Modified Rankin (Stroke Patients Only)       Balance Overall balance assessment: Needs assistance Sitting-balance support: Feet supported, No upper extremity supported Sitting balance-Leahy Scale: Fair Sitting balance - Comments: fair/good seated at EOB   Standing balance support: Reliant on assistive device for balance, During functional activity, Bilateral upper extremity supported Standing balance-Leahy Scale: Poor Standing balance comment: using RW                             Pertinent Vitals/Pain Pain Assessment Pain  Assessment: No/denies pain    Home Living Family/patient expects to be discharged to:: Private residence Living Arrangements: Spouse/significant other Available Help at Discharge: Family;Available 24 hours/day Type of Home: Mobile home Home Access: Ramped entrance       Home Layout: One level Home  Equipment: Grab bars - tub/shower;Rolling Walker (2 wheels);Rollator (4 wheels);BSC/3in1;Cane - quad;Shower seat;Wheelchair - manual;Hospital bed      Prior Function Prior Level of Function : Needs assist       Physical Assist : Mobility (physical);ADLs (physical) Mobility (physical): Bed mobility;Transfers;Gait;Stairs   Mobility Comments: Short household distances using RW and RLE BKA prosthetec leg, uses wheelchair mostly ADLs Comments: Requires assist for ADLs bed level, pt reports self feeding, grooming and managing UB ADLs. Using brief to have BM and husband A with cleaning, rolling in bed to assist with spouse donning LB clothing.     Extremity/Trunk Assessment   Upper Extremity Assessment Upper Extremity Assessment: Defer to OT evaluation    Lower Extremity Assessment Lower Extremity Assessment: Generalized weakness    Cervical / Trunk Assessment Cervical / Trunk Assessment: Normal  Communication   Communication Communication: No apparent difficulties    Cognition Arousal: Alert Behavior During Therapy: WFL for tasks assessed/performed   PT - Cognitive impairments: No apparent impairments                         Following commands: Intact       Cueing       General Comments      Exercises     Assessment/Plan    PT Assessment Patient needs continued PT services  PT Problem List Decreased strength;Decreased activity tolerance;Decreased balance;Decreased mobility       PT Treatment Interventions DME instruction;Gait training;Stair training;Functional mobility training;Therapeutic activities;Therapeutic exercise;Balance training;Patient/family education    PT Goals (Current goals can be found in the Care Plan section)  Acute Rehab PT Goals Patient Stated Goal: return home with spouse to assist PT Goal Formulation: With patient Time For Goal Achievement: 10/09/23 Potential to Achieve Goals: Good    Frequency Min 3X/week      Co-evaluation               AM-PAC PT "6 Clicks" Mobility  Outcome Measure Help needed turning from your back to your side while in a flat bed without using bedrails?: A Little Help needed moving from lying on your back to sitting on the side of a flat bed without using bedrails?: A Little Help needed moving to and from a bed to a chair (including a wheelchair)?: A Lot Help needed standing up from a chair using your arms (e.g., wheelchair or bedside chair)?: A Lot Help needed to walk in hospital room?: A Lot Help needed climbing 3-5 steps with a railing? : A Lot 6 Click Score: 14    End of Session   Activity Tolerance: Patient tolerated treatment well;Patient limited by fatigue Patient left: in chair;with call bell/phone within reach Nurse Communication: Mobility status PT Visit Diagnosis: Unsteadiness on feet (R26.81);Other abnormalities of gait and mobility (R26.89);Muscle weakness (generalized) (M62.81)    Time: 1610-9604 PT Time Calculation (min) (ACUTE ONLY): 37 min   Charges:   PT Evaluation $PT Eval Moderate Complexity: 1 Mod PT Treatments $Therapeutic Activity: 23-37 mins PT General Charges $$ ACUTE PT VISIT: 1 Visit         12:33 PM, 10/05/23 Walton Guppy, MPT Physical Therapist with [facility] 336 [phone] office [phone] mobile  phone

## 2023-10-05 NOTE — Progress Notes (Signed)
 Nurse at bedside,patient's CBG was 42, Dr Lincoln Renshaw at bedside. Patient was given 1 can of soda and peanut butter crackers per Dr Lincoln Renshaw. Plan of care on going,

## 2023-10-05 NOTE — Progress Notes (Signed)
  [facility] Progress Note    Assessment/ Plan:   # AMS/UTI: s/p meropenem , bcx ngtd-per primary   # ESRD: on HD TTS, HD planned for tomorrow if she's still here   # Hypertension/volume: getting under EDW, recommend readjusting EDW to post treatment weight from 5/10: around 74.8kg   # Anemia of ESRD: Just received erythropoietin as outpatient, monitor hemoglobin-stable   # Secondary hyperparathyroidism, metabolic Bone Disease: Continue calcitriol  and sevelamer .  Po4 acceptable  OP HD orders: Dialyzes at [facility], [facility], TTS, 4 hr, EDW 76.2 kg. HD 2k2ca [place] , 400/800 Heparin  .  Access RIJ TDC. Mircera 100 mcg on 5/3 Calcitriol  2.5 mcg three times per week  Subjective:   Patient seen and examined. No complaints this AM. She states that she is going home today.   Objective:   BP 133/77 (BP Location: Right Arm)   Pulse 82   Temp 98.4 F (36.9 C) (Oral)   Resp 20   Ht 5\' 5"  (1.651 m)   Wt 73.7 kg   SpO2 100%   BMI 27.04 kg/m   Intake/Output Summary (Last 24 hours) at 10/05/2023 0749 Last data filed at 10/04/2023 1230 Gross per 24 hour  Intake 240 ml  Output --  Net 240 ml   Weight change:   Physical Exam: Gen: NAD CVS: RRR Resp: unlabored, normal wob Abd: soft Ext: rt bka, no sig edema b/l Les Neuro: awake, alert Dialysis access: RIJ TDC c/d/i  Imaging: No results found.  Labs: BMET Recent Labs  Lab 10/01/23 1337 10/02/23 0356 10/03/23 0418 10/04/23 0417 10/05/23 0438  NA 128* 132* 128* 131* 130*  K 3.3* 3.7 4.0 3.7 4.1  CL 87* 90* 89* 95* 94*  CO2 25 19* 21* 26 26  GLUCOSE 163* 220* 224* 295* 94  BUN 34* 44* 55* 27* 42*  CREATININE 4.37* 4.89* 6.35* 3.71* 4.74*  CALCIUM  9.6 8.6* 8.2* 8.4* 8.8*  PHOS  --   --  5.3* 3.1 3.6   CBC Recent Labs  Lab 10/02/23 0356 10/03/23 0418 10/04/23 0417 10/05/23 0438  WBC 8.3 5.8 5.4 6.1  HGB 9.7* 10.5* 9.8* 11.0*  HCT 33.6* 33.9* 33.2* 36.3  MCV 92.8 90.2 90.2 90.8  PLT 365 313 274 230     Medications:     amiodarone   200 mg Oral Daily   apixaban   5 mg Oral BID   atorvastatin   80 mg Oral Daily   Chlorhexidine  Gluconate Cloth  6 each Topical Q0600   clonazePAM   0.5 mg Oral BID   doxercalciferol  4 mcg Intravenous Q T,Th,Sa-HD   folic acid   1 mg Oral Daily   insulin  aspart  0-6 Units Subcutaneous TID WC   insulin  aspart  10 Units Subcutaneous TID WC   insulin  glargine-yfgn  35 Units Subcutaneous Daily   levothyroxine   50 mcg Oral QAC breakfast   liver oil-zinc  oxide   Topical BID   melatonin  3 mg Oral QHS   pantoprazole   40 mg Oral Daily   polyethylene glycol  17 g Oral Daily   sevelamer  carbonate  1,600 mg Oral TID WC   venlafaxine  XR  75 mg Oral Q breakfast      Cristi Donalds, MD [facility] 10/05/2023, 7:49 AM

## 2023-10-05 NOTE — Therapy (Incomplete)
 OUTPATIENT PHYSICAL THERAPY PROSTHETIC TREATMENT  Patient Name: Susan [name] MRN: [id number] DOB:1950-12-20, 73 y.o., female Today's Date: 10/05/2023  PCP: Austine Lefort, MD  REFERRING PROVIDER:  Gearldean Keepers, MD  END OF SESSION:            Past Medical History:  Diagnosis Date   Anemia    hx   Anxiety    Arthritis    "generalized" (03/15/2014)   CAD (coronary artery disease)    MI in 2000 - MI  2007 - treated bare metal stent (no nuclear since then as 9/11)   Carotid artery disease (HCC)    Chronic diastolic heart failure (HCC)    a) ECHO (08/2013) EF 55-60% and RV function nl b) RHC (08/2013) RA 4, RV 30/5/7, PA 25/10 (16), PCWP 7, Fick CO/CI 6.3/2.7, PVR 1.5 WU, PA 61 and 66%   Daily headache    "~ every other day; since I fell in June" (03/15/2014)   Depression    Diabetic retinopathy (HCC)    Dyslipidemia    ESRD (end stage renal disease) (HCC)    Dialysis on Tues Thurs Sat   Exertional shortness of breath    GERD (gastroesophageal reflux disease)    History of blood transfusion    History of kidney stones    HTN (hypertension)    Hypothyroidism    Myocardial infarction (HCC)    Obesity    Osteoarthritis    PAF (paroxysmal atrial fibrillation) (HCC)    Peripheral neuropathy    bilateral feet/hands   PONV (postoperative nausea and vomiting)    RBBB (right bundle branch block)    Old   Stroke (HCC)    mini strokes   Type II diabetes mellitus (HCC)    Type II, [name] libre left upper arm. patient has omnipod insulin  pump with Novolin R Insulin    Past Surgical History:  Procedure Laterality Date   A/V FISTULAGRAM Left 11/07/2022   Procedure: A/V Fistulagram;  Surgeon: Carlene Che, MD;  [facility]: [facility];  Service: Cardiovascular;  Laterality: Left;   ABDOMINAL HYSTERECTOMY  1980's   AMPUTATION Right 02/24/2018   Procedure: RIGHT FOOT GREAT TOE AND 2ND TOE AMPUTATION;  Surgeon: Timothy Ford, MD;  Location: [facility] OR;  Service:  Orthopedics;  Laterality: Right;   AMPUTATION Right 04/30/2018   Procedure: RIGHT TRANSMETATARSAL AMPUTATION;  Surgeon: Timothy Ford, MD;  Location: [facility] OR;  Service: Orthopedics;  Laterality: Right;   AMPUTATION Right 05/02/2022   Procedure: RIGHT BELOW KNEE AMPUTATION;  Surgeon: Timothy Ford, MD;  Location: [facility] OR;  Service: Orthopedics;  Laterality: Right;   APPLICATION OF WOUND VAC Right 06/13/2022   Procedure: APPLICATION OF WOUND VAC;  Surgeon: Timothy Ford, MD;  [facility]: [facility] OR;  Service: Orthopedics;  Laterality: Right;   APPLICATION OF WOUND VAC Left 11/14/2022   Procedure: APPLICATION OF WOUND VAC;  Surgeon: Timothy Ford, MD;  [facility]: [facility] OR;  Service: Orthopedics;  Laterality: Left;   AV FISTULA PLACEMENT Left 04/02/2022   Procedure: LEFT ARM ARTERIOVENOUS (AV) FISTULA CREATION;  Surgeon: Margherita Shell, MD;  Location: [facility] OR;  Service: Vascular;  Laterality: Left;  PERIPHERAL NERVE BLOCK   AV FISTULA PLACEMENT Right 04/27/2023   Procedure: RIGHT ARM BRACHIOBASILIC ARTERIOVENOUS (AV) FISTULA CREATION;  Surgeon: Carlene Che, MD;  Location: [facility] OR;  Service: Vascular;  Laterality: Right;   BASCILIC VEIN TRANSPOSITION Left 07/31/2022   Procedure: LEFT ARM SECOND STAGE BASILIC VEIN TRANSPOSITION;  Surgeon: Margherita Shell, MD;  Location: [facility] OR;  Service: Vascular;  Laterality: Left;   BIOPSY  05/27/2020   Procedure: BIOPSY;  Surgeon: Vinetta Greening, DO;  [facility]: [facility];  Service: Endoscopy;;   CATARACT EXTRACTION, BILATERAL Bilateral ?2013   COLONOSCOPY W/ POLYPECTOMY     COLONOSCOPY WITH PROPOFOL  N/A 03/13/2019   Procedure: COLONOSCOPY WITH PROPOFOL ;  Surgeon: Nannette Babe, MD;  Location: [facility] ENDOSCOPY;  Service: Gastroenterology;  Laterality: N/A;   CORONARY ANGIOPLASTY WITH STENT PLACEMENT  1999; 2007   "1 + 1"   ERCP N/A 02/03/2022   Procedure: ENDOSCOPIC RETROGRADE CHOLANGIOPANCREATOGRAPHY (ERCP);  Surgeon: Alvis Jourdain, MD;  Location: [facility] ENDOSCOPY;  Service:  Gastroenterology;  Laterality: N/A;   ESOPHAGOGASTRODUODENOSCOPY N/A 02/12/2023   Procedure: ESOPHAGOGASTRODUODENOSCOPY (EGD);  Surgeon: Alvis Jourdain, MD;  Location: [facility] ENDOSCOPY;  Service: Gastroenterology;  Laterality: N/A;   ESOPHAGOGASTRODUODENOSCOPY (EGD) WITH PROPOFOL  N/A 03/13/2019   Procedure: ESOPHAGOGASTRODUODENOSCOPY (EGD) WITH PROPOFOL ;  Surgeon: Nannette Babe, MD;  Location: [facility] ENDOSCOPY;  Service: Gastroenterology;  Laterality: N/A;   ESOPHAGOGASTRODUODENOSCOPY (EGD) WITH PROPOFOL  N/A 05/27/2020   Procedure: ESOPHAGOGASTRODUODENOSCOPY (EGD) WITH PROPOFOL ;  Surgeon: Vinetta Greening, DO;  [facility];  Service: Endoscopy;  Laterality: N/A;   ESOPHAGOGASTRODUODENOSCOPY (EGD) WITH PROPOFOL  N/A 09/03/2022   Procedure: ESOPHAGOGASTRODUODENOSCOPY (EGD) WITH PROPOFOL ;  Surgeon: Alvis Jourdain, MD;  Location: [facility] ENDOSCOPY;  Service: Gastroenterology;  Laterality: N/A;   EYE SURGERY Bilateral    lazer   FLEXIBLE SIGMOIDOSCOPY N/A 05/23/2022   Procedure: FLEXIBLE SIGMOIDOSCOPY;  Surgeon: Alvis Jourdain, MD;  Location: [facility] ENDOSCOPY;  Service: Gastroenterology;  Laterality: N/A;   FLEXIBLE SIGMOIDOSCOPY N/A 05/24/2022   Procedure: FLEXIBLE SIGMOIDOSCOPY;  Surgeon: Daina Drum, MD;  Location: [facility] ENDOSCOPY;  Service: Gastroenterology;  Laterality: N/A;   FLEXIBLE SIGMOIDOSCOPY N/A 09/03/2022   Procedure: FLEXIBLE SIGMOIDOSCOPY;  Surgeon: Alvis Jourdain, MD;  Location: [facility] ENDOSCOPY;  Service: Gastroenterology;  Laterality: N/A;   HEMOSTASIS CLIP PLACEMENT  03/13/2019   Procedure: HEMOSTASIS CLIP PLACEMENT;  Surgeon: Nannette Babe, MD;  Location: [facility] ENDOSCOPY;  Service: Gastroenterology;;   HEMOSTASIS CLIP PLACEMENT  05/23/2022   Procedure: HEMOSTASIS CLIP PLACEMENT;  Surgeon: Alvis Jourdain, MD;  Location: [facility] ENDOSCOPY;  Service: Gastroenterology;;   HEMOSTASIS CONTROL  05/24/2022   Procedure: HEMOSTASIS CONTROL;  Surgeon: Daina Drum, MD;  Location: [facility] ENDOSCOPY;  Service:  Gastroenterology;;   HOT HEMOSTASIS N/A 05/23/2022   Procedure: HOT HEMOSTASIS (ARGON PLASMA COAGULATION/BICAP);  Surgeon: Alvis Jourdain, MD;  Location: [facility] ENDOSCOPY;  Service: Gastroenterology;  Laterality: N/A;   I & D EXTREMITY Left 05/05/2022   Procedure: IRRIGATION AND DEBRIDEMENT LEFT ARM AV FISTULA;  Surgeon: Young Hensen, MD;  Location: [facility] OR;  Service: Vascular;  Laterality: Left;   I & D EXTREMITY N/A 11/14/2022   Procedure: IRRIGATION AND DEBRIDEMENT OF LOWER EXTREMITY WOUND;  Surgeon: Timothy Ford, MD;  [facility]: [facility] OR;  Service: Orthopedics;  Laterality: N/A;   INSERTION OF DIALYSIS CATHETER Right 04/02/2022   Procedure: INSERTION OF TUNNELED DIALYSIS CATHETER;  Surgeon: Margherita Shell, MD;  Location: [facility] OR;  Service: Vascular;  Laterality: Right;   IR FLUORO GUIDE CV LINE RIGHT  08/10/2023   IR VENOCAVAGRAM IVC  08/10/2023   KNEE ARTHROSCOPY Left 10/25/2006   POLYPECTOMY  03/13/2019   Procedure: POLYPECTOMY;  Surgeon: Nannette Babe, MD;  Location: [facility] ENDOSCOPY;  Service: Gastroenterology;;   REMOVAL OF STONES  02/03/2022   Procedure: REMOVAL OF STONES;  Surgeon: Alvis Jourdain, MD;  Location:  [facility] ENDOS[facility];  Service: Gastroenterology;;   REVISON OF ARTERIOVENOUS FISTULA Left 08/20/2022   Procedure: REVISON OF LEFT ARM ARTERIOVENOUS FISTULA;  Surgeon: Margherita Shell, MD;  Location: [facility] OR;  Service: Vascular;  Laterality: Left;   RIGHT HEART CATH N/A 07/24/2017   Procedure: RIGHT HEART CATH;  Surgeon: Mardell Shade, MD;  [facility];  Service: Cardiovascular;  Laterality: N/A;   RIGHT HEART CATHETERIZATION N/A 09/22/2013   Procedure: RIGHT HEART CATH;  Surgeon: Mardell Shade, MD;  Location: [facility] CATH LAB;  Service: Cardiovascular;  Laterality: N/A;   SHOULDER ARTHROSCOPY WITH OPEN ROTATOR CUFF REPAIR Right 03/14/2014   Procedure: RIGHT SHOULDER ARTHROSCOPY WITH BICEPS RELEASE, OPEN SUBSCAPULA REPAIR, OPEN SUPRASPINATUS REPAIR.;  Surgeon: Jasmine Mesi, MD;  Location: [facility] OR;  Service: Orthopedics;  Laterality: Right;   SPHINCTEROTOMY  02/03/2022   Procedure: SPHINCTEROTOMY;  Surgeon: Alvis Jourdain, MD;  Location: [facility] ENDOSCOPY;  Service: Gastroenterology;;   STUMP REVISION Right 06/13/2022   Procedure: REVISION RIGHT BELOW KNEE AMPUTATION;  Surgeon: Timothy Ford, MD;  Location: [facility] OR;  Service: Orthopedics;  Laterality: Right;   TEE WITHOUT CARDIOVERSION N/A 02/04/2022   Procedure: TRANSESOPHAGEAL ECHOCARDIOGRAM (TEE);  Surgeon: Mardell Shade, MD;  Location: [facility] ENDOSCOPY;  Service: Cardiovascular;  Laterality: N/A;   THROMBECTOMY W/ EMBOLECTOMY Left 08/20/2022   Procedure: THROMBECTOMY OF LEFT ARM ARTERIOVENOUS FISTULA;  Surgeon: Margherita Shell, MD;  Location: [facility] OR;  Service: Vascular;  Laterality: Left;   TOE AMPUTATION Right 02/24/2018   GREAT TOE AND 2ND TOE AMPUTATION   TUBAL LIGATION  1970's   UPPER EXTREMITY VENOGRAPHY N/A 07/31/2023   Procedure: UPPER EXTREMITY VENOGRAPHY;  Surgeon: Carlene Che, MD;  [facility]: [facility];  Service: Cardiovascular;  Laterality: N/A;   Patient Active Problem List   Diagnosis Date Noted   UTI (urinary tract infection) 10/01/2023   Pyelonephritis 08/06/2023   Complicated UTI (urinary tract infection) 08/05/2023   Atrial fibrillation (HCC) 06/17/2023   Atopic dermatitis 06/17/2023   Hyperosmolar hyperglycemic state (HHS) (HCC) 02/05/2023   Gastroparesis 02/05/2023   Depression 01/29/2023   Intractable nausea and vomiting 01/20/2023   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) 04/07/2022   Allergy, unspecified, initial encounter 04/07/2022   Dependence on renal dialysis (HCC) 04/07/2022   Gout due to renal impairment, unspecified site 04/07/2022   Hypertensive heart and chronic kidney disease with heart failure and with stage  5 chronic kidney disease, or end stage renal disease (HCC) 04/07/2022   Personal history of transient ischemic attack (TIA), and cerebral infarction without residual deficits 04/07/2022   Renal osteodystrophy 04/07/2022   Venous stasis ulcer of right calf (HCC) 03/31/2022   Fistula, colovaginal 03/26/2022   Diarrhea 03/26/2022   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   Sepsis without acute organ dysfunction (HCC)    Bacteremia    Acute pancreatitis 02/01/2022   Abdominal pain 02/01/2022   SIRS (systemic inflammatory response syndrome) (HCC) 02/01/2022   Transaminitis 02/01/2022   History of anemia due to chronic kidney disease 02/01/2022   Paroxysmal atrial fibrillation (HCC) 02/01/2022   Uncontrolled type 2 diabetes mellitus with hyperglycemia, with long-term current use of insulin  (HCC) 01/14/2022   NSTEMI (non-ST elevated myocardial infarction) (HCC) 03/05/2021   Acute renal  failure superimposed on stage 4 chronic kidney disease (HCC) 08/22/2020   Hypoalbuminemia 05/25/2020   GERD (gastroesophageal reflux disease) 05/25/2020   Pressure injury of skin 05/17/2020   Acute on chronic combined systolic and diastolic congestive heart failure (HCC) 03/07/2020   Type 2 diabetes mellitus with diabetic polyneuropathy, with long-term current use of insulin  (HCC) 03/07/2020   Obesity, Class III, BMI 40-49.9 (morbid obesity) 03/07/2020   Common bile duct (CBD) obstruction 05/28/2019   Benign neoplasm of ascending colon    Benign neoplasm of transverse colon    Benign neoplasm of descending colon    Benign neoplasm of sigmoid colon    Gastric polyps    Hyperkalemia 03/11/2019   Prolonged QT interval 03/11/2019   Acute blood loss anemia 03/11/2019   Onychomycosis 06/21/2018   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)    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 (HCC)    PVD (peripheral vascular disease) (HCC) 10/26/2017   E-coli UTI 07/27/2017   Hypothyroidism 07/27/2017   AKI (acute kidney injury) (HCC)    PAH (pulmonary artery hypertension) (HCC)    Impaired ambulation 07/19/2017   Leg cramps 02/27/2017   Peripheral edema 01/12/2017   Diabetic neuropathy (HCC) 11/12/2016   Anemia of chronic disease 10/03/2015   Historical diagnosis of generalized anxiety disorder 10/03/2015   Secondary insomnia 10/03/2015   Acute bronchitis 09/05/2015   Hyperglycemia due to diabetes mellitus (HCC) 06/07/2015   Non compliance with medical treatment 04/17/2014   Rotator cuff tear 03/14/2014   Obesity 09/23/2013   Chronic HFrEF (heart failure with reduced ejection fraction) (HCC) 06/03/2013   Hypotension 12/25/2012   Hypokalemia 12/25/2012   Hyponatremia 12/25/2012   Urinary incontinence    MDD (major depressive disorder), recurrent episode, moderate (HCC) 11/12/2010   RBBB (right bundle branch block)    Wide-complex tachycardia    Coronary artery  disease    Hyperlipemia 01/22/2009   Chronic hypotension 01/22/2009    ONSET DATE: 03/09/2023 MD referral to PT  REFERRING DIAG: S88.111D (ICD-10-CM) - Below-knee amputation of right lower extremity, subsequent encounter   THERAPY DIAG:  No diagnosis found.  Rationale for Evaluation and Treatment: Rehabilitation  SUBJECTIVE:  SUBJECTIVE STATEMENT: ***   She saw Alexander Iba, CPO today who thinks pads are currently working.    PERTINENT HISTORY: Right TTA 05/02/22, Lymphedema, idiopathic chronic venous HTN with ulcer, depression, colitis, diverticulitis, ESRD, gout, NSTEMI, DM2, polyneuropathy, PVD, CAD, right bundle branch block, mini strokes  PAIN:  Are you having pain?  Back pain today  *** 7/10 upon arrival  Right shoulder today at rest 4-5/10 with moving 8-9/10;  pillow for support 5/10 (getting better sleep).  PRECAUTIONS: Fall and Other: No BP LUE  WEIGHT BEARING RESTRICTIONS: No  FALLS: Has patient fallen in last 6 months? No  LIVING ENVIRONMENT: Lives with: lives with their spouse and 2 small dogs Lives in: mobile home Home Access: Ramped entrance Home layout: One level Stairs: Yes: External: 6-7 steps; can reach both Has following equipment at home: Single point cane, Environmental consultant - 2 wheeled, Environmental consultant - 4 wheeled, Wheelchair (manual), [employer], Grab bars, and Ramped entry  OCCUPATION:  retired  PLOF: prior to amputation for ~year used RW in home & community  PATIENT GOALS:  to use prosthesis to walk, get out w/c in & out of car, get to bathroom.   OBJECTIVE:  COGNITION: Overall cognitive status:  Eval on 03/23/2023:   Within functional limits for tasks assessed  POSTURE:  Eval on 03/23/2023:  rounded shoulders, forward head, increased thoracic kyphosis, flexed trunk , and weight shift left  LOWER EXTREMITY ROM: ROM Right eval Left eval  Hip flexion    Hip extension    Hip abduction    Hip adduction    Hip internal rotation    Hip external  rotation    Knee flexion    Knee extension    Ankle dorsiflexion    Ankle plantarflexion    Ankle inversion    Ankle eversion     (Blank rows = not tested)  LOWER EXTREMITY MMT:  MMT Right eval Left eval Right 09/09/23 Left 09/09/23  Hip flexion 3-/5 3-/5 3/5 3/5  Hip extension 2/5 2/5 3-/5 3-/5  Hip abduction 2+/5 2+/5 3-/5 3-/5  Hip adduction      Hip internal rotation      Hip external rotation      Knee flexion 3-/5 3-/5 3/5 3/5  Knee extension 3-/5 3-/5 3/5 3/5  Ankle dorsiflexion      Ankle plantarflexion      Ankle inversion      Ankle eversion      At Evaluation all strength testing is grossly seated and functionally standing / gait. (Blank rows = not tested)  TRANSFERS: 09/07/2023: Scooting transfers with minA and sit to/from stand transfers with modA.   07/27/2023: -Sit>stand from wc with RW: MinA with heavy vc for sequencing and offweighting LE -Stand>sit wc with RW: CGA with patient performing safe controlled descent  06/15/2023: -Sit to stand transfers with RW wheelchair<>mat table at varied height: 19in with maxA to rise to stand; 22in ModA rise to stand, 24in min-modA rise to stand. -stand pivot transfer with RW w/c to mat table with modA once standing.  -Lateral scooting wc<>table at level height SBA; vc for LE positioning for optimal use of LE during transfer -lateral scoot R: modA due to fatigue and uneven surface transfer from low surface to higher surface   05/04/2023: Squat pivot transfers with modA.  04/21/2023: Pt sit to stand w/c to //bar with one hand pushing on w/c and other hand on //bar with modA first time & MinA 2nd/ 3rd time.  Best is RUE on //bar & LUE on w/c.  Stand to sit reaching LUE to w/c with minA to control descent.   PT demo & verbal cues on sit to/from stand w/c to RW.  1st time modA 2 person. 2nd time maxA one person.  Stand to sit with minA and constant cueing. Scooting transfers with minA with w/c & mat direct contact & same ht.    Eval on 03/23/2023:  Sit to stand: Max A (two-person assist) from 22"  w/c to rolling walker Stand to sit: Max A (two-person assist) rolling walker to wheelchair  FUNCTIONAL TESTs:   09/09/2023: Patient able to tolerate static stance for 5 minutes with BUE support on RW with supervision.  This was current certification goal set so that patient can stand long enough for her husband to assist with cleaning her after toileting.  07/27/2023: Patient stood with RW SBA for 54sec before c/o lightheadedness and sitting down to toilet. Resisted nudges applied posteriorly to simulate hygienic cleaning. VC for weight shifts when LE become tired.  BP: after standing 65min54sec 161/82 HR 92  06/15/2023: -Static stance for standing with RW; requiring Mod-MaxA for rise to stand d/t patient unsteady on feet posterior lean without proper righting strategy, SBA for stand   04/21/2023: Pt able to stand 60 sec with BUE on //bars with supervision.  Pt able to stand for 1 min with RW support with minA.  Eval on 03/23/2023: Patient maintained upright holding rolling walker with mod assist 2 people for safety for 30 seconds.  GAIT: 09/09/2023: Patient ambulated 30' with RW including turning 90*to position to sit with mod assistance  07/27/2023: Patient ambulated 49ft with narrow bathroom entry and 90deg turn to sit on commode CGA with minA and heavy vc for progression and sequencing of RW. While turning to sit on commode patient lost balance laterally requiring mod-maxA from therapist and patient use of grab bar to regain balance.   06/17/2023: Patient ambulated 30ft (maximal tolerated distance) with RW ModA (2 person for safety). Required VC for LE sequencing and minA to steer walker and prevent tip over.   04/21/2023: Pt amb 5' in //bars with modA (2 person for safety) with PT verbal & tactile cues on sequence and weight shift.  Pt amb 10' with RW with maxA (2 person for safety) with PT verbal &  tactile cues on sequence and weight shift.  Eval on 03/23/2023:  Patient took 2 steps (one per LE) with +2 max assist with rolling walker and TTA prosthesis.  Patient adducting prosthesis stepping on left foot with no awareness.  CURRENT PROSTHETIC WEAR ASSESSMENT: 09/07/2023: Patient and husband are independent with: skin check, prosthetic cleaning, ply sock cleaning, proper wear schedule/adjustment, residual limb care, correct ply sock adjustment, and proper weight-bearing schedule/adjustment Pt reports wear most of awake hours on dialysis and non-dialysis days.  She wears prosthesis to dialysis now to assist with transfers prn and is able to verbalize proper adjustment of prosthesis weight for weigh-in & weigh-out.   06/17/2023:  Independence with prosthetic care:  Patient and husband are independent with: skin check, prosthetic cleaning, ply sock cleaning, proper wear schedule/adjustment Patient is dependent with: residual limb care, correct ply sock adjustment, and proper weight-bearing schedule/adjustment Donning prosthesis: husband is independent with proper donning. Patient requires MaxA. Patient can verbally direct someone how to properly assist. Doffing prosthesis: Husband is independent with proper donning. Patient requires modA. Patient can verbally direct someone how to properly assist. Prosthetic wear tolerance: majority of awake hours including dialysis hours, 1x/day, 3-4 days/week Prosthetic weight bearing tolerance: able to tolerate of standing without complains of prosthetic limb pain. Time not limited by limb discomfort but limited by muscle fatigue.   Eval on 03/23/2023:   Patient is dependent with: skin check, residual limb care, prosthetic cleaning, ply sock cleaning, correct ply sock adjustment, proper wear schedule/adjustment, and proper weight-bearing schedule/adjustment Donning prosthesis: Max A Doffing prosthesis: Min A Prosthetic wear tolerance: 2-6 hours,  1x/day, 3-4  days/week Prosthetic weight bearing tolerance: 3 minutes with limb pain Edema: pitting edema Residual limb condition: No open areas, dry skin, normal color and temperature, cylindrical shape Prosthetic description: Silicone liner with pin lock suspension, total contact socket with flexible inner socket, sach foot    TODAY'S TREATMENT:                                                                                                             DATE: 10/05/2023 Prosthetic Training with Transtibial Prosthesis: - P***    TREATMENT:                                                                                                             DATE: 09/23/2023 Prosthetic Training with Transtibial Prosthesis: - Pt sit to stand wheelchair to RW with minA.  Patient able to stand upright for 3 minutes with RW support with supervision.  Patient had controlled stand to sit without assistance.  Therapeutic exercise: - PT reprinted patient HEP HO.  PT reviewed HEP handout with 2 groups of exercises.  First group is for exercises to be performed while patient is in the bed: Bridge, lower body trunk rotation, heel slides with strap assist and hip Abd/adduction.  Second group is seated exercises with patient sitting in wheelchair feet on the floor position at the edge of the wheelchair: Forward trunk lean to upright for back extension, leaning back with set up for abdominals, lateral trunk lean including how to protect right shoulder, trunk rotation, marching and stepping lower extremity out/back.  Patient performed 5 reps of all exercises with understanding PT is recommending 2 sets of 5 reps per HEP.  PT strongly discussed need to perform HEP daily on all days that she is not in dialysis and if tolerated after dialysis.  Patient and husband verbalized understanding of HEP.    TREATMENT:                                                                                                              DATE: 09/21/2023 Prosthetic Training with Transtibial Prosthesis: - PT demo & verbal cues on sequencing for straight  path gait with RW, turning 90* to right & to left to position to sit. (advancing rolling walker after each footsteps until the back legs of RW are even with her front toes to keep walker closer and improve step through gait pattern for balance).  Verbal cues to breathe as she walks. -pt amb 10' 15\' 10" , 15" with RW with minA for straight path and min to modA to turn 90* to position to sit.   -Sit to stand X 6 with RW throughout the session with cues to lean forward more upon rising to improve performance and require less assistance with this, did improve some with practice  Therapeutic Exercise: Seated in wheelchair, lumbar flexion stretch with pball roll outs 5 sec X 10 Seated in wheelchair, lumbar extension into yellow ball roll 5 sec X 10 Seated in wheelchair with back off of backrest for more core activation performing lumbar lateral flexion AROM X 10 each side Nu step seat #9 X 5 min UE/LE, discontinued using Rt UE do to pain    PATIENT EDUCATION: PATIENT EDUCATED ON FOLLOWING PROSTHETIC CARE: Education details: Use of stockinette under proximal liner to decrease itching sensation, no wounds presents to PT recommended not using Vive wear under liner Skin check, Prosthetic cleaning, Propper donning, and Proper wear schedule/adjustment Prosthetic wear tolerance: 3 hours 2x/day, 4 days/week Person educated: Patient and Spouse Education method: Explanation, Tactile cues, and Verbal cues Education comprehension: verbalized understanding, verbal cues required, tactile cues required, and needs further education  HOME EXERCISE PROGRAM: Access Code: [email or web address] URL: [email or web address] Date: 09/23/2023 Prepared by: Lorie Rook  Exercises - Supine Bridge  - 1 x daily - 4 x weekly - 2 sets - 5 reps - 2 seconds hold - Supine Lower Trunk Rotation  - 1 x daily - 4 x  weekly - 2 sets - 5 reps - 5 seconds hold - Supine Heel Slide with Strap  - 1 x daily - 4 x weekly - 2 sets - 5 reps - 2-3 seconds hold - Supine Hip Abduction  - 1 x daily - 4 x weekly - 2 sets - 5 reps - 2-3 seconds hold - Seated Hip Flexion Toward Target  - 1 x daily - 4 x weekly - 2 sets - 5 reps - 5 seconds hold - Seated Eccentric Abdominal Lean Back  - 1 x daily - 4 x weekly - 2 sets - 5 reps - 5 seconds hold - Seated Sidebending  - 1 x daily - 4 x weekly - 2 sets - 5 reps - 5 seconds hold - Seated Trunk Rotation with Crossed Arms  - 1 x daily - 4 x weekly - 2 sets - 5 reps - 5 seconds hold - Seated March  - 1 x daily - 4 x weekly - 2 sets - 10 reps - 5 seconds hold - Seated Knee Flexion Extension AROM   - 1 x daily - 4 x weekly - 2 sets - 10 reps - 5 seconds hold    ASSESSMENT:  CLINICAL IMPRESSION: ***   PT reviewed need for compliance with HEP to improve strength which is directly tied to improving her functional mobility.  Patient verbalized understanding.  Patient had improved sit to and from stand today.  Patient continues to benefit from skilled PT.  OBJECTIVE IMPAIRMENTS: Abnormal gait, decreased activity tolerance, decreased balance, decreased endurance, decreased knowledge of condition, decreased knowledge of use of DME, decreased mobility, difficulty walking, decreased ROM, decreased strength, decreased safety awareness, postural  dysfunction, prosthetic dependency , and pain.   ACTIVITY LIMITATIONS: standing, transfers, and locomotion level  PARTICIPATION LIMITATIONS: community activity, household mobility and dependency / burden of care on family  PERSONAL FACTORS: Age, Fitness, Past/current experiences, Time since onset of injury/illness/exacerbation, and 3+ comorbidities: see PMH are also affecting patient's functional outcome.   REHAB POTENTIAL: Good  CLINICAL DECISION MAKING: Evolving/moderate complexity  EVALUATION COMPLEXITY: Moderate   GOALS: Goals reviewed with  patient? Yes  SHORT TERM GOALS: Target date: 10/07/2023:  Patient & husband verbalized patient able to perform scooting transfer to / from [facility] and sit to/from stand [facility] to RW with husband's assistance safely. Baseline: SEE OBJECTIVE DATA Goal status:   Ongoing   10/05/2023 2.  Patient able to sit to stand w/c to rolling walker with min assistance. Baseline: SEE OBJECTIVE DATA Goal status: Ongoing  10/05/2023  3. Patient ambulates 11ft including turning 90* to position to sit with RW & prosthesis with modA.  Baseline: Ongoing  10/05/2023  UPDATED LONG TERM GOALS: Target date: 12/03/2023  Patient & husband continues to demonstrate & verbalize understanding of prosthetic care and wears prosthesis >80% of awake hours daily to enable safe utilization of prosthesis. Baseline: SEE OBJECTIVE DATA Goal status: Ongoing   10/05/2023  Patient and husband report patient is able to stand to enable cleaning after toileting safely with no issues Baseline: SEE OBJECTIVE DATA Goal status: Ongoing 10/05/2023  Patient able to perform sit to/from stand transfers with minA.  Baseline: SEE OBJECTIVE DATA Goal status: Ongoing  10/05/2023  Patient ambulates >11ft including turning 90* to position to sit with prosthesis & RW with minA with husband's assistance safely. Baseline: SEE OBJECTIVE DATA Goal status: Ongoing  10/05/2023  Patient understanding of ongoing HEP. Baseline: SEE OBJECTIVE DATA Goal status:  Ongoing  10/05/2023  PLAN:  PT FREQUENCY: 2x/week  PT DURATION: 12 weeks  PLANNED INTERVENTIONS: 97164- PT Re-evaluation, 97110-Therapeutic exercises, 97530- Therapeutic activity, 97112- Neuromuscular re-education, [phone]- Self Care, 09811- Gait training, [phone]- Prosthetic training, Patient/Family education, Balance training, DME instructions, Therapeutic exercises, Therapeutic activity, Neuromuscular re-education, Gait training, and Self Care  PLAN FOR NEXT SESSION: ***  Check on compliance with HEP.   Continue to work towards updated STG's.   Jerriann Schrom, PT, DPT 10/05/2023, 7:39 AM

## 2023-10-05 NOTE — Plan of Care (Signed)
  Problem: Acute Rehab PT Goals(only PT should resolve) Goal: Pt Will Go Supine/Side To Sit Outcome: Progressing Flowsheets (Taken 10/05/2023 1234) Pt will go Supine/Side to Sit:  with contact guard assist  with minimal assist Goal: Patient Will Transfer Sit To/From Stand Outcome: Progressing Flowsheets (Taken 10/05/2023 1234) Patient will transfer sit to/from stand: with minimal assist Goal: Pt Will Transfer Bed To Chair/Chair To Bed Outcome: Progressing Flowsheets (Taken 10/05/2023 1234) Pt will Transfer Bed to Chair/Chair to Bed: with min assist Goal: Pt Will Ambulate Outcome: Progressing Flowsheets (Taken 10/05/2023 1234) Pt will Ambulate:  25 feet  with minimal assist  with rolling walker   12:35 PM, 10/05/23 Walton Guppy, MPT Physical Therapist with [facility] [phone] office [phone] mobile phone

## 2023-10-05 NOTE — Plan of Care (Addendum)
 Pt has not been able to produce a bowel movement. States her abdomen is still uncomfortable, feels she needs to have a bowel movement. Gave pt another prn dose of lactulose  for constipation. Pt also experienced an episode of sweating and clamminess. Pt was nauseated. Checked v/s and bs, within range. Gave compazine . Pt stated she felt better. Will continue to monitor if symptoms persists.   Problem: Clinical Measurements: Goal: Ability to maintain clinical measurements within normal limits will improve Outcome: Progressing Goal: Will remain free from infection Outcome: Progressing Goal: Diagnostic test results will improve Outcome: Progressing   Problem: Coping: Goal: Level of anxiety will decrease Outcome: Progressing   Problem: Elimination: Goal: Will not experience complications related to urinary retention Outcome: Progressing   Problem: Pain Managment: Goal: General experience of comfort will improve and/or be controlled Outcome: Progressing   Problem: Safety: Goal: Ability to remain free from injury will improve Outcome: Progressing   Problem: Coping: Goal: Ability to adjust to condition or change in health will improve Outcome: Progressing   Problem: Health Behavior/Discharge Planning: Goal: Ability to manage health-related needs will improve Outcome: Progressing   Problem: Metabolic: Goal: Ability to maintain appropriate glucose levels will improve Outcome: Progressing   Problem: Skin Integrity: Goal: Risk for impaired skin integrity will decrease Outcome: Progressing   Problem: Tissue Perfusion: Goal: Adequacy of tissue perfusion will improve Outcome: Progressing

## 2023-10-06 ENCOUNTER — Encounter (HOSPITAL_COMMUNITY): Payer: Self-pay | Admitting: Internal Medicine

## 2023-10-06 LAB — RENAL FUNCTION PANEL
Albumin: 2.4 g/dL — ABNORMAL LOW (ref 3.5–5.0)
Anion gap: 13 (ref 5–15)
BUN: 49 mg/dL — ABNORMAL HIGH (ref 8–23)
CO2: 24 mmol/L (ref 22–32)
Calcium: 9 mg/dL (ref 8.9–10.3)
Chloride: 93 mmol/L — ABNORMAL LOW (ref 98–111)
Creatinine, Ser: 5.76 mg/dL — ABNORMAL HIGH (ref 0.44–1.00)
GFR, Estimated: 7 mL/min — ABNORMAL LOW (ref >60)
Glucose, Bld: 61 mg/dL — ABNORMAL LOW (ref 70–99)
Phosphorus: 4.2 mg/dL (ref 2.5–4.6)
Potassium: 4.1 mmol/L (ref 3.5–5.1)
Sodium: 130 mmol/L — ABNORMAL LOW (ref 135–145)

## 2023-10-06 LAB — CULTURE, BLOOD (ROUTINE X 2)
Culture: NO GROWTH
Culture: NO GROWTH

## 2023-10-06 LAB — CBC
HCT: 33.5 % — ABNORMAL LOW (ref 36.0–46.0)
Hemoglobin: 10.1 g/dL — ABNORMAL LOW (ref 12.0–15.0)
MCH: 26.9 pg (ref 26.0–34.0)
MCHC: 30.1 g/dL (ref 30.0–36.0)
MCV: 89.3 fL (ref 80.0–100.0)
Platelets: 205 10*3/uL (ref 150–400)
RBC: 3.75 MIL/uL — ABNORMAL LOW (ref 3.87–5.11)
RDW: 17.7 % — ABNORMAL HIGH (ref 11.5–15.5)
WBC: 5.4 10*3/uL (ref 4.0–10.5)
nRBC: 0 % (ref 0.0–0.2)

## 2023-10-06 LAB — GLUCOSE, CAPILLARY
Glucose-Capillary: 76 mg/dL (ref 70–99)
Glucose-Capillary: 86 mg/dL (ref 70–99)
Glucose-Capillary: 88 mg/dL (ref 70–99)
Glucose-Capillary: 370 mg/dL — ABNORMAL HIGH (ref 70–99)

## 2023-10-06 LAB — HEPATITIS B SURFACE ANTIBODY, QUANTITATIVE: Hep B S AB Quant (Post): 233 m[IU]/mL

## 2023-10-06 MED ORDER — INSULIN GLARGINE-YFGN 100 UNIT/ML ~~LOC~~ SOLN: 20.0000 [IU] | Freq: Every day | SUBCUTANEOUS | Status: DC

## 2023-10-06 MED ORDER — INSULIN GLARGINE-YFGN 100 UNIT/ML ~~LOC~~ SOLN
18.0000 [IU] | Freq: Every day | SUBCUTANEOUS | Status: DC
  Administered 2023-10-07 – 2023-10-09 (×3): 18 [IU] via SUBCUTANEOUS
  Filled 2023-10-06 (×4): qty 0.18

## 2023-10-06 MED ORDER — INSULIN ASPART 100 UNIT/ML IJ SOLN
4.0000 [IU] | Freq: Three times a day (TID) | INTRAMUSCULAR | Status: DC
  Administered 2023-10-07 – 2023-10-09 (×4): 4 [IU] via SUBCUTANEOUS

## 2023-10-06 NOTE — Progress Notes (Signed)
  HEMODIALYSIS TREATMENT NOTE:  As per Emergency Management Department, pt was transferred to [facility] campus to receive hemodialysis treatment; [place] ([facility]) is without water .  Pt arrived [facility] at 1055.  A/Ox3.  BP 136/67 (89), HR 74/NSR,  R18,  spO2 100@ on 2L.  Pre-weight 79kg.  2.8 liter goal.  Pt denies pain but endorses mild nausea.  Consent obtained.  HD was initiated at 1122 using RIJ TDC.  Pt did not eat breakfast this morning and will not eat lunch with HD.  Glucose 128 at 1340 (value did not transfer to [facility]).  4 hour low-heparin  treatment completed.  Goal met: 2.8 liters removed without interruption in UF.  Hemodynamically stable throughout session.  All blood was returned.    Meds given:  Heparin  1500u IVP bolus, Doxercalciferol 4 mcg IVP.  Post-HD:  10/06/23 1530  Vital Signs  Temp 97.7 F (36.5 C)  Temp Source Oral  Pulse Rate 84  Pulse Rate Source Monitor  Resp 16  BP 130/61  BP Location Right Arm  BP Method Automatic  Patient Position (if appropriate) Lying  Oxygen  Therapy  SpO2 100 %  O2 Device Nasal Cannula  O2 Flow Rate (L/min) 2 L/min  Pain Assessment  Pain Score 0  Dialysis Weight  Weight 76.2 kg  Type of Weight Post-Dialysis  Estimated Dry Weight 76.2 kg  Post Treatment  Dialyzer Clearance Lightly streaked  Hemodialysis Intake (mL) 0 mL  Liters Processed 96  Fluid Removed (mL) 2800 mL  Tolerated HD Treatment Yes  Post-Hemodialysis Comments Goal met. At EDW.   Pt was collected by [facility] for transport back to [facility] at 1545.  Report was called to Aleta Anda, LPN at [facility].  Kenzlei Runions, RN [facility]

## 2023-10-06 NOTE — Progress Notes (Signed)
 Nurse at bedside,patient alert to person,place,and time,confused to situation this am.Blood pressure was 104/45,heart rate 80,Dr Johnson notified,holding the pacerone .Patient's  CBG was 86 this am,did not eat breakfast this am,Dr Lincoln Renshaw notified,holding the Semglee  25 units this am. Plan of care on going.

## 2023-10-06 NOTE — Plan of Care (Signed)
  Problem: Clinical Measurements: Goal: Ability to maintain clinical measurements within normal limits will improve Outcome: Progressing Goal: Will remain free from infection Outcome: Progressing Goal: Diagnostic test results will improve Outcome: Progressing   Problem: Coping: Goal: Level of anxiety will decrease Outcome: Progressing   Problem: Elimination: Goal: Will not experience complications related to urinary retention Outcome: Progressing   Problem: Pain Managment: Goal: General experience of comfort will improve and/or be controlled Outcome: Progressing   Problem: Safety: Goal: Ability to remain free from injury will improve Outcome: Progressing   Problem: Coping: Goal: Ability to adjust to condition or change in health will improve Outcome: Progressing   Problem: Health Behavior/Discharge Planning: Goal: Ability to manage health-related needs will improve Outcome: Progressing   Problem: Metabolic: Goal: Ability to maintain appropriate glucose levels will improve Outcome: Progressing   Problem: Skin Integrity: Goal: Risk for impaired skin integrity will decrease Outcome: Progressing   Problem: Tissue Perfusion: Goal: Adequacy of tissue perfusion will improve Outcome: Progressing

## 2023-10-06 NOTE — Progress Notes (Signed)
 [facility] Liaison Note  10/06/2023  Cyndra Gundy [facility] 1951/03/29 [id number]  Location: RN Hospital Liaison screened the patient remotely at [facility].  Insurance: [employer]   ANNIEBELLE LAPIER is a 73 y.o. female who is a Primary Care Patient of [name], Cisco Crest, MD with  [facility]. The patient was screened for readmission hospitalization with noted extreme risk score for unplanned readmission risk with 1 IP/1 ED in 6 months.  The patient was assessed for potential Care Management service needs for post hospital transition for care coordination. Review of patient's electronic medical record reveals patient was admitted for UTI.  Pt will discharged home with self care. Pt has a supportive spouse and utilizing outpt PT services at a local agency. No VBCI needs at this time.  Plan: [name] Liaison will continue to follow progress and disposition to asess for post hospital community care coordination/management needs.  Referral request for community care coordination: anticipate Transitions of Care Team follow up.   VBCI Care Management/Population Health does not replace or interfere with any arrangements made by the Inpatient Transition of Care team.   For questions contact:   Lilla Reichert, RN, [facility] Liaison [place]   [facility], Population Health Office Hours MTWF  8:00 am-6:00 pm Direct Dial : [phone] mobile [email or web address]

## 2023-10-06 NOTE — Progress Notes (Signed)
 OT Cancellation Note  Patient Details Name: Susan [name] MRN: [id number] DOB: 10-10-1950   Cancelled Treatment:    Reason Eval/Treat Not Completed: Patient at procedure or test/ unavailable. Pt not in the room when evaluation was attempted. Will attempt later as timer permits.   [name] OT, MOT   [name] 10/06/2023, 11:59 AM

## 2023-10-06 NOTE — Progress Notes (Signed)
 [facility] Progress Note    Assessment/ Plan:   # AMS/UTI: s/p meropenem , bcx ngtd-per primary   # ESRD: on HD TTS, continue dialysis per schedule.  Will transfer to [facility] for dialysis   # Hypertension/volume: getting under EDW, recommend readjusting EDW to post treatment weight from 5/10: around 74.8kg   # Anemia of ESRD: Just received erythropoietin as outpatient, monitor hemoglobin-stable   # Secondary hyperparathyroidism, metabolic Bone Disease: Continue calcitriol  and sevelamer .  Po4 acceptable  OP HD orders: [facility] at [facility], TTS, 4 hr, EDW 76.2 kg. HD 2k2ca [place] , 400/800 Heparin  .  Access RIJ TDC. Mircera 100 mcg on 5/3 Calcitriol  2.5 mcg three times per week  Subjective:   Patient states she feels okay with some mild nausea.  Denies shortness of breath or chest pain.   Objective:   BP (!) 106/45 (BP Location: Right Wrist)   Pulse 77   Temp 98 F (36.7 C) (Oral)   Resp 19   Ht 5\' 5"  (1.651 m)   Wt 73.7 kg   SpO2 100%   BMI 27.04 kg/m   Intake/Output Summary (Last 24 hours) at 10/06/2023 1610 Last data filed at 10/05/2023 1808 Gross per 24 hour  Intake 720 ml  Output --  Net 720 ml   Weight change:   Physical Exam: Gen: NAD, lying in bed CVS: Normal rate, no rub Resp: Bilateral chest rise with no increased work of breathing Abd: soft, nondistended Ext: rt bka, trace edema of the bilateral lower extremities Neuro: awake, alert Dialysis access: RIJ [facility] c/d/i  Imaging: DG Abd Portable 2V Result Date: 10/05/2023 CLINICAL DATA:  Constipation.  Nausea.  Abdominal distention. EXAM: PORTABLE ABDOMEN - 2 VIEW COMPARISON:  12/08/2022 FINDINGS: Paucity of intestinal gas with no dilated bowel loops seen. Normal amount of stool. No free peritoneal air. Double-lumen right sided double-lumen catheter tip at the superior cavoatrial junction. Coronary artery stents. Tortuous and partially calcified thoracic aorta. Lumbar and thoracic spine  degenerative changes. IMPRESSION: Nonobstructive bowel gas pattern with a normal amount of stool. Electronically Signed   By: Catherin Closs M.D.   On: 10/05/2023 14:25    Labs: BMET Recent Labs  Lab 10/01/23 1337 10/02/23 0356 10/03/23 0418 10/04/23 0417 10/05/23 0438  NA 128* 132* 128* 131* 130*  K 3.3* 3.7 4.0 3.7 4.1  CL 87* 90* 89* 95* 94*  CO2 25 19* 21* 26 26  GLUCOSE 163* 220* 224* 295* 94  BUN 34* 44* 55* 27* 42*  CREATININE 4.37* 4.89* 6.35* 3.71* 4.74*  CALCIUM  9.6 8.6* 8.2* 8.4* 8.8*  PHOS  --   --  5.3* 3.1 3.6   CBC Recent Labs  Lab 10/03/23 0418 10/04/23 0417 10/05/23 0438 10/06/23 0413  WBC 5.8 5.4 6.1 5.4  HGB 10.5* 9.8* 11.0* 10.1*  HCT 33.9* 33.2* 36.3 33.5*  MCV 90.2 90.2 90.8 89.3  PLT 313 274 230 205    Medications:     amiodarone   200 mg Oral Daily   apixaban   5 mg Oral BID   atorvastatin   80 mg Oral Daily   Chlorhexidine  Gluconate Cloth  6 each Topical Q0600   clonazePAM   0.5 mg Oral BID   doxercalciferol  4 mcg Intravenous Q T,Th,Sa-HD   folic acid   1 mg Oral Daily   insulin  aspart  0-6 Units Subcutaneous TID WC   insulin  aspart  4 Units Subcutaneous TID WC   insulin  glargine-yfgn  25 Units Subcutaneous Daily   levothyroxine   50 mcg  Oral QAC breakfast   liver oil-zinc  oxide   Topical BID   melatonin  3 mg Oral QHS   milk and molasses  1 enema Rectal Once   pantoprazole   40 mg Oral Daily   polyethylene glycol  17 g Oral Daily   sevelamer  carbonate  1,600 mg Oral TID WC   venlafaxine  XR  75 mg Oral Q breakfast      [facility] 10/06/2023, 8:52 AM

## 2023-10-06 NOTE — Plan of Care (Signed)
  Problem: Coping: Goal: Level of anxiety will decrease Outcome: Progressing   Problem: Pain Managment: Goal: General experience of comfort will improve and/or be controlled Outcome: Progressing   Problem: Coping: Goal: Level of anxiety will decrease Outcome: Progressing   Problem: Pain Managment: Goal: General experience of comfort will improve and/or be controlled Outcome: Progressing

## 2023-10-06 NOTE — Plan of Care (Signed)
  Problem: Clinical Measurements: Goal: Ability to maintain clinical measurements within normal limits will improve Outcome: Progressing Goal: Will remain free from infection Outcome: Progressing Goal: Diagnostic test results will improve Outcome: Progressing   Problem: Coping: Goal: Level of anxiety will decrease Outcome: Adequate for Discharge   Problem: Elimination: Goal: Will not experience complications related to urinary retention Outcome: Progressing   Problem: Pain Managment: Goal: General experience of comfort will improve and/or be controlled Outcome: Adequate for Discharge   Problem: Safety: Goal: Ability to remain free from injury will improve Outcome: Adequate for Discharge   Problem: Coping: Goal: Ability to adjust to condition or change in health will improve Outcome: Adequate for Discharge   Problem: Health Behavior/Discharge Planning: Goal: Ability to manage health-related needs will improve Outcome: Progressing   Problem: Metabolic: Goal: Ability to maintain appropriate glucose levels will improve Outcome: Progressing   Problem: Skin Integrity: Goal: Risk for impaired skin integrity will decrease Outcome: Progressing   Problem: Tissue Perfusion: Goal: Adequacy of tissue perfusion will improve Outcome: Progressing

## 2023-10-06 NOTE — Progress Notes (Signed)
 PROGRESS NOTE   Susan [name]  ZHY:[id number] DOB: 1951/02/15 DOA: 10/01/2023 PCP: Austine Lefort, MD   Chief Complaint  Patient presents with   Altered Mental Status   Level of care: Med-Surg  Brief Admission History:  73 y.o. female, with PMH significant for ESRD-HD-TTS, PAF on Eliquis , CAD status post PCI, chronic systolic CHF with EF 30-35%, DM2, HTN, HLD, hypothyroidism, anemia of CKD, right BKA, and depression/anxiety with recent hospitalization on 3/12, ESBL UTI and bacteremia.  The right subclavian HD catheter exchanged during that admission.   -Pt was sent from HD center for evaluation, patient reports she has not been feeling well, having nausea, occasional vomiting, poor appetite, constipation, as well she does endorse dysuria, was found to be very sick at dialysis center, so was sent to ED for evaluation. - In ED, her UA was positive, she does endorse dysuria, was positive, no leukocytosis, afebrile, sodium was low at 128, potassium at 3.3, CT abdomen pelvis with diverticulosis, and constipation, treated hospitalist consulted to admit.   Assessment and Plan:  UTI - TREATED  - Patient with known history of ESBL UTI and bacteremia in the past, she presents with dysuria, had leukocytosis - UA is positive - Treated with meropenem  - full treatment completed on 5/11 - DC was held due to issues with constipation and hypoglycemia which we are addressing    ESRD-HD-TTS -She missed her HD as she was sent from dialysis center due to her illness. - Location for emergency dialysis overnight, renal consult requested in epic to dialyze tomorrow. - Patient right subclavian hemodialysis catheter was changed out by IR 3/17 because of bacteremia during previous admission - HD treatment was done on 5/10 - next treatment 5/13 per nephrology team - due to AP water  crisis, pt will be round-trip transferred to [facility] for HD    Chronic hyponatremia -At baseline, management with dialysis    Hypokalemia -management with dialysis   Chronic hypotension Chronic systolic CHF -Continue with home midodrine  before dialysis days -HD treatment today for volume removal -Most recent echo from April 2024 with EF 30 to 35%, moderate to severe LV dysfunction   PAF  Heart rate controlled with amiodarone  Chronically anticoagulated with apixaban     CAD, HLD Apixaban  and statin   Type 2 diabetes mellitus uncontrolled with hyperglycemia - Check A1c 6.8%  - increased to 35 units of Semglee  and novolog  10 units TID with meals, plus insulin  sliding scale  CBG (last 3)  Recent Labs    10/05/23 2119 10/06/23 0317 10/06/23 0718  GLUCAP 82 76 86   Hypoglycemia from insulin  - pt had a severe hypoglycemia episode on 5/12 where BS dropped to 42, I treated at bedside - we have greatly reduced her insulin  doses as she is no longer having glucotoxicity - continue frequent CBG monitoring as ordered 5x per day  Anemia of CKD Hemoglobin at baseline, management per renal   Hypothyroidism Continue Synthroid    Depression/anxiety Continue Klonopin , melatonin nightly, Effexor  daily   Constipation concern for fecal impaction -started on bowel regimen -added another molasses enema ordered -check portable abd xrays on 5/12- normal amount of stool reported -continue miralax , DC lactulose  due to cramping symptoms   DVT prophylaxis: apixaban   Code Status: Full  Family Communication: bedside updated on 5/12  Disposition: anticipating home in 1-2 days   Consultants:  Nephrology   Procedures:   Antimicrobials:  Meropenem  5/8>>5/11   Subjective: Pt says she is having bowel movements but having gassy abdominal pains  and no more hypoglycemia episodes   Objective: Vitals:   10/06/23 1105 10/06/23 1122 10/06/23 1130 10/06/23 1145  BP: 136/67 125/70 128/65 131/69  Pulse: 76     Resp: 18 11 14 14   Temp: 98 F (36.7 C)     TempSrc: Oral     SpO2: 100%     Weight: 79 kg     Height:         Intake/Output Summary (Last 24 hours) at 10/06/2023 1200 Last data filed at 10/05/2023 1808 Gross per 24 hour  Intake 480 ml  Output --  Net 480 ml   Filed Weights   10/03/23 1311 10/03/23 1745 10/06/23 1105  Weight: 74.8 kg 73.7 kg 79 kg   Examination:  General exam: Appears chronically ill; calm and comfortable  Respiratory system: Clear to auscultation. Respiratory effort normal. Cardiovascular system: normal S1 & S2 heard. No JVD, murmurs, rubs, gallops or clicks. No pedal edema. Gastrointestinal system: Abdomen is nondistended, soft and nontender. No organomegaly or masses felt. Normal bowel sounds heard. Central nervous system: Alert and oriented. No focal neurological deficits. Extremities: Symmetric 5 x 5 power. Skin: No rashes, lesions or ulcers. Psychiatry: Judgement and insight appear normal. Mood & affect appropriate.   Data Reviewed: I have personally reviewed following labs and imaging studies  CBC: Recent Labs  Lab 10/02/23 0356 10/03/23 0418 10/04/23 0417 10/05/23 0438 10/06/23 0413  WBC 8.3 5.8 5.4 6.1 5.4  HGB 9.7* 10.5* 9.8* 11.0* 10.1*  HCT 33.6* 33.9* 33.2* 36.3 33.5*  MCV 92.8 90.2 90.2 90.8 89.3  PLT 365 313 274 230 205    Basic Metabolic Panel: Recent Labs  Lab 10/02/23 0356 10/03/23 0418 10/04/23 0417 10/05/23 0438 10/06/23 0413  NA 132* 128* 131* 130* 130*  K 3.7 4.0 3.7 4.1 4.1  CL 90* 89* 95* 94* 93*  CO2 19* 21* 26 26 24   GLUCOSE 220* 224* 295* 94 61*  BUN 44* 55* 27* 42* 49*  CREATININE 4.89* 6.35* 3.71* 4.74* 5.76*  CALCIUM  8.6* 8.2* 8.4* 8.8* 9.0  MG  --  2.4  --   --   --   PHOS  --  5.3* 3.1 3.6 4.2    CBG: Recent Labs  Lab 10/05/23 1633 10/05/23 2043 10/05/23 2119 10/06/23 0317 10/06/23 0718  GLUCAP 70 68* 82 76 86    Recent Results (from the past 240 hours)  Culture, blood (Routine X 2) w Reflex to ID Panel     Status: None   Collection Time: 10/01/23  2:54 PM   Specimen: BLOOD  Result Value Ref Range  Status   Specimen Description BLOOD BLOOD LEFT ARM  Final   Special Requests   Final    BOTTLES DRAWN AEROBIC ONLY Blood Culture results may not be optimal due to an inadequate volume of blood received in culture bottles   Culture   Final    NO GROWTH 5 DAYS Performed at [facility], [address] [place], [place] [address]    Report Status 10/06/2023 FINAL  Final  Culture, blood (Routine X 2) w Reflex to ID Panel     Status: None   Collection Time: 10/01/23  2:54 PM   Specimen: BLOOD  Result Value Ref Range Status   Specimen Description BLOOD BLOOD RIGHT ARM  Final   Special Requests   Final    BOTTLES DRAWN AEROBIC ONLY Blood Culture results may not be optimal due to an inadequate volume of blood received in culture bottles  Culture   Final    NO GROWTH 5 DAYS Performed at [facility], [address], [place], [place] [address]    Report Status 10/06/2023 FINAL  Final  MRSA Next Gen by PCR, Nasal     Status: None   Collection Time: 10/02/23  3:52 AM   Specimen: Nasal Mucosa; Nasal Swab  Result Value Ref Range Status   MRSA by PCR Next Gen NOT DETECTED NOT DETECTED Final    Comment: (NOTE) The GeneXpert MRSA Assay (FDA approved for NASAL specimens only), is one component of a comprehensive MRSA colonization surveillance program. It is not intended to diagnose MRSA infection nor to guide or monitor treatment for MRSA infections. Test performance is not FDA approved in patients less than 26 years old. Performed at [facility] [address], [place], [place] [address]     Radiology Studies: DG Abd Portable 2V Result Date: 10/05/2023 CLINICAL DATA:  Constipation.  Nausea.  Abdominal distention. EXAM: PORTABLE ABDOMEN - 2 VIEW COMPARISON:  12/08/2022 FINDINGS: Paucity of intestinal gas with no dilated bowel loops seen. Normal amount of stool. No free peritoneal air. Double-lumen right sided double-lumen catheter tip at the superior cavoatrial junction. Coronary artery stents.  Tortuous and partially calcified thoracic aorta. Lumbar and thoracic spine degenerative changes. IMPRESSION: Nonobstructive bowel gas pattern with a normal amount of stool. Electronically Signed   By: Catherin Closs M.D.   On: 10/05/2023 14:25    Scheduled Meds:  amiodarone   200 mg Oral Daily   apixaban   5 mg Oral BID   atorvastatin   80 mg Oral Daily   Chlorhexidine  Gluconate Cloth  6 each Topical Q0600   clonazePAM   0.5 mg Oral BID   doxercalciferol  4 mcg Intravenous Q T,Th,Sa-HD   folic acid   1 mg Oral Daily   insulin  aspart  0-6 Units Subcutaneous TID WC   insulin  aspart  4 Units Subcutaneous TID WC   insulin  glargine-yfgn  25 Units Subcutaneous Daily   levothyroxine   50 mcg Oral QAC breakfast   liver oil-zinc  oxide   Topical BID   melatonin  3 mg Oral QHS   milk and molasses  1 enema Rectal Once   pantoprazole   40 mg Oral Daily   polyethylene glycol  17 g Oral Daily   sevelamer  carbonate  1,600 mg Oral TID WC   venlafaxine  XR  75 mg Oral Q breakfast   Continuous Infusions:   LOS: 5 days   Time spent: 55 mins  Jessi Jessop Lincoln Renshaw, MD How to contact the [facility] Attending or Consulting provider 7A - 7P or covering provider during after hours 7P -7A, for this patient?  Check the care team in [facility] and look for a) attending/consulting TR[facility] provider listed and b) the TRH team listed Log into [email or web address] to find provider on call.  Locate the TRH provider you are looking for under [facility] and page to a number that you can be directly reached. If you still have difficulty reaching the provider, please page the [facility] (Director on Call) for the Hospitalists listed on amion for assistance.  10/06/2023, 12:00 PM

## 2023-10-07 ENCOUNTER — Other Ambulatory Visit (HOSPITAL_COMMUNITY): Payer: Self-pay | Admitting: *Deleted

## 2023-10-07 ENCOUNTER — Other Ambulatory Visit (HOSPITAL_COMMUNITY)

## 2023-10-07 ENCOUNTER — Encounter: Admitting: Physical Therapy

## 2023-10-07 DIAGNOSIS — I5022 Chronic systolic (congestive) heart failure: Secondary | ICD-10-CM

## 2023-10-07 LAB — RENAL FUNCTION PANEL
Albumin: 2.4 g/dL — ABNORMAL LOW (ref 3.5–5.0)
Anion gap: 11 (ref 5–15)
BUN: 29 mg/dL — ABNORMAL HIGH (ref 8–23)
CO2: 23 mmol/L (ref 22–32)
Calcium: 8.1 mg/dL — ABNORMAL LOW (ref 8.9–10.3)
Chloride: 96 mmol/L — ABNORMAL LOW (ref 98–111)
Creatinine, Ser: 3.88 mg/dL — ABNORMAL HIGH (ref 0.44–1.00)
GFR, Estimated: 12 mL/min — ABNORMAL LOW (ref >60)
Glucose, Bld: 404 mg/dL — ABNORMAL HIGH (ref 70–99)
Phosphorus: 4 mg/dL (ref 2.5–4.6)
Potassium: 4.5 mmol/L (ref 3.5–5.1)
Sodium: 130 mmol/L — ABNORMAL LOW (ref 135–145)

## 2023-10-07 LAB — CBC
HCT: 33 % — ABNORMAL LOW (ref 36.0–46.0)
Hemoglobin: 9.8 g/dL — ABNORMAL LOW (ref 12.0–15.0)
MCH: 27 pg (ref 26.0–34.0)
MCHC: 29.7 g/dL — ABNORMAL LOW (ref 30.0–36.0)
MCV: 90.9 fL (ref 80.0–100.0)
Platelets: 191 10*3/uL (ref 150–400)
RBC: 3.63 MIL/uL — ABNORMAL LOW (ref 3.87–5.11)
RDW: 17.8 % — ABNORMAL HIGH (ref 11.5–15.5)
WBC: 5.1 10*3/uL (ref 4.0–10.5)
nRBC: 0 % (ref 0.0–0.2)

## 2023-10-07 LAB — GLUCOSE, CAPILLARY
Glucose-Capillary: 426 mg/dL — ABNORMAL HIGH (ref 70–99)
Glucose-Capillary: 360 mg/dL — ABNORMAL HIGH (ref 70–99)
Glucose-Capillary: 339 mg/dL — ABNORMAL HIGH (ref 70–99)
Glucose-Capillary: 237 mg/dL — ABNORMAL HIGH (ref 70–99)
Glucose-Capillary: 207 mg/dL — ABNORMAL HIGH (ref 70–99)
Glucose-Capillary: 185 mg/dL — ABNORMAL HIGH (ref 70–99)

## 2023-10-07 MED ORDER — PANTOPRAZOLE SODIUM 40 MG PO TBEC
40.0000 mg | DELAYED_RELEASE_TABLET | Freq: Two times a day (BID) | ORAL | Status: DC
  Administered 2023-10-07 – 2023-10-09 (×4): 40 mg via ORAL
  Filled 2023-10-07 (×4): qty 1

## 2023-10-07 MED ORDER — BISACODYL 10 MG RE SUPP
10.0000 mg | Freq: Once | RECTAL | Status: DC
  Filled 2023-10-07: qty 1

## 2023-10-07 MED ORDER — SODIUM CHLORIDE 0.9 % IV SOLN
1.0000 g | Freq: Once | INTRAVENOUS | Status: AC
  Administered 2023-10-07: 1 g via INTRAVENOUS
  Filled 2023-10-07: qty 20

## 2023-10-07 MED ORDER — PROCHLORPERAZINE EDISYLATE 10 MG/2ML IJ SOLN
5.0000 mg | Freq: Four times a day (QID) | INTRAMUSCULAR | Status: AC
  Administered 2023-10-07 – 2023-10-08 (×4): 5 mg via INTRAVENOUS
  Filled 2023-10-07 (×4): qty 2

## 2023-10-07 NOTE — Consult Note (Signed)
 Cardiology Consultation   Patient ID: Susan [name] MRN: [id number]; DOB: [birth year]  Admit date: 10/01/2023 Date of Consult: 10/07/2023  PCP:  Austine Lefort, MD   [facility] Providers Cardiologist:  Jules Oar, MD        Patient Profile:   Susan [name] is a 73 y.o. female with a hx of  DM2, chronic HFrEF, CAD with  remote MI, prior BMS to RCA x 2. NSTEMI 02/2021 managed medically due to CKD IV at the time., ESRD, PAD with prior BKA wheelchair bound, afib, who is being seen 10/07/2023 for the evaluation of chronic HFrEF at the request of Dr Tat.  History of Present Illness:   Susan [name] 73 yo female history of DM2, chronic HFrEF, CAD with  remote MI, prior BMS to RCA x 2. NSTEMI 02/2021 managed medically due to CKD IV at the time., ESRD, PAD with prior BKA wheelchair bound, afib, admitted 10/01/23 with AMS. Managed for UTI by primary team. She had missed an HD session prior to admission as well, inpatient dialysis managed by neprhology. Cardiology consulted to help manage her chronic HFrEF. She is bed/wheelchair bound with her right BKA, not able to assess her exercise capacity. Reports some increased SOB over the last several days, no specific chest pains.    Admit labs WBC 9.5 Hgb 12.7 Plt 452 Na 128 K 3.3 BUN 34 Cr 4.37  EKG SR, IVCD CXR: central congestion, +edema. Possible small left pleural effusion CT head: no acute process   08/2022 echo: LVE 30-35%, global hypokinesis with apical akinesis. Mild RV dysfunction.  04/2023 nuclear stress: inferior infarct, apical infarct, no current ischemia      Past Medical History:  Diagnosis Date   Anemia    hx   Anxiety    Arthritis    "generalized" (03/15/2014)   CAD (coronary artery disease)    MI in 2000 - MI  2007 - treated bare metal stent (no nuclear since then as 9/11)   Carotid artery disease (HCC)    Chronic diastolic heart failure (HCC)    a) ECHO (08/2013) EF 55-60% and RV function nl b) RHC  (08/2013) RA 4, RV 30/5/7, PA 25/10 (16), PCWP 7, Fick CO/CI 6.3/2.7, PVR 1.5 WU, PA 61 and 66%   Daily headache    "~ every other day; since I fell in June" (03/15/2014)   Depression    Diabetic retinopathy (HCC)    Dyslipidemia    ESRD (end stage renal disease) (HCC)    Dialysis on Tues Thurs Sat   Exertional shortness of breath    GERD (gastroesophageal reflux disease)    History of blood transfusion    History of kidney stones    HTN (hypertension)    Hypothyroidism    Myocardial infarction (HCC)    Obesity    Osteoarthritis    PAF (paroxysmal atrial fibrillation) (HCC)    Peripheral neuropathy    bilateral feet/hands   PONV (postoperative nausea and vomiting)    RBBB (right bundle Schneur Crowson block)    Old   Stroke (HCC)    mini strokes   Type II diabetes mellitus (HCC)    Type II, [name] libre left upper arm. patient has omnipod insulin  pump with Novolin R Insulin     Past Surgical History:  Procedure Laterality Date   A/V FISTULAGRAM Left 11/07/2022   Procedure: A/V Fistulagram;  Surgeon: Carlene Che, MD;  [facility];  Service: Cardiovascular;  Laterality: Left;  ABDOMINAL HYSTERECTOMY  1980's   AMPUTATION Right 02/24/2018   Procedure: RIGHT FOOT GREAT TOE AND 2ND TOE AMPUTATION;  Surgeon: Timothy Ford, MD;  Location: [facility] OR;  Service: Orthopedics;  Laterality: Right;   AMPUTATION Right 04/30/2018   Procedure: RIGHT TRANSMETATARSAL AMPUTATION;  Surgeon: Timothy Ford, MD;  Location: [facility] OR;  Service: Orthopedics;  Laterality: Right;   AMPUTATION Right 05/02/2022   Procedure: RIGHT BELOW KNEE AMPUTATION;  Surgeon: Timothy Ford, MD;  Location: [facility] OR;  Service: Orthopedics;  Laterality: Right;   APPLICATION OF WOUND VAC Right 06/13/2022   Procedure: APPLICATION OF WOUND VAC;  Surgeon: Timothy Ford, MD;  Location: [facility] OR;  Service: Orthopedics;  Laterality: Right;   APPLICATION OF WOUND VAC Left 11/14/2022   Procedure: APPLICATION OF WOUND VAC;  Surgeon:  Timothy Ford, MD;  Location: [facility] OR;  Service: Orthopedics;  Laterality: Left;   AV FISTULA PLACEMENT Left 04/02/2022   Procedure: LEFT ARM ARTERIOVENOUS (AV) FISTULA CREATION;  Surgeon: Margherita Shell, MD;  Location: [facility] OR;  Service: Vascular;  Laterality: Left;  PERIPHERAL NERVE BLOCK   AV FISTULA PLACEMENT Right 04/27/2023   Procedure: RIGHT ARM BRACHIOBASILIC ARTERIOVENOUS (AV) FISTULA CREATION;  Surgeon: Carlene Che, MD;  Location: [facility] OR;  Service: Vascular;  Laterality: Right;   BASCILIC VEIN TRANSPOSITION Left 07/31/2022   Procedure: LEFT ARM SECOND STAGE BASILIC VEIN TRANSPOSITION;  Surgeon: Margherita Shell, MD;  [facility]: [facility] OR;  Service: Vascular;  Laterality: Left;   BIOPSY  05/27/2020   Procedure: BIOPSY;  Surgeon: Vinetta Greening, DO;  [facility]: [facility];  Service: Endoscopy;;   CATARACT EXTRACTION, BILATERAL Bilateral ?2013   COLONOSCOPY W/ POLYPECTOMY     COLONOSCOPY WITH PROPOFOL  N/A 03/13/2019   Procedure: COLONOSCOPY WITH PROPOFOL ;  Surgeon: Nannette Babe, MD;  Location: [facility] ENDOSCOPY;  Service: Gastroenterology;  Laterality: N/A;   CORONARY ANGIOPLASTY WITH STENT PLACEMENT  1999; 2007   "1 + 1"   ERCP N/A 02/03/2022   Procedure: ENDOSCOPIC RETROGRADE CHOLANGIOPANCREATOGRAPHY (ERCP);  Surgeon: Alvis Jourdain, MD;  Location: [facility] ENDOSCOPY;  Service: Gastroenterology;  Laterality: N/A;   ESOPHAGOGASTRODUODENOSCOPY N/A 02/12/2023   Procedure: ESOPHAGOGASTRODUODENOSCOPY (EGD);  Surgeon: Alvis Jourdain, MD;  Location: [facility] ENDOSCOPY;  Service: Gastroenterology;  Laterality: N/A;   ESOPHAGOGASTRODUODENOSCOPY (EGD) WITH PROPOFOL  N/A 03/13/2019   Procedure: ESOPHAGOGASTRODUODENOSCOPY (EGD) WITH PROPOFOL ;  Surgeon: Nannette Babe, MD;  Location: [facility] ENDOSCOPY;  Service: Gastroenterology;  Laterality: N/A;   ESOPHAGOGASTRODUODENOSCOPY (EGD) WITH PROPOFOL  N/A 05/27/2020   Procedure: ESOPHAGOGASTRODUODENOSCOPY (EGD) WITH PROPOFOL ;  Surgeon: Vinetta Greening, DO;  [facility];   Service: Endoscopy;  Laterality: N/A;   ESOPHAGOGASTRODUODENOSCOPY (EGD) WITH PROPOFOL  N/A 09/03/2022   Procedure: ESOPHAGOGASTRODUODENOSCOPY (EGD) WITH PROPOFOL ;  Surgeon: Alvis Jourdain, MD;  Location: [facility] ENDOSCOPY;  Service: Gastroenterology;  Laterality: N/A;   EYE SURGERY Bilateral    lazer   FLEXIBLE SIGMOIDOSCOPY N/A 05/23/2022   Procedure: FLEXIBLE SIGMOIDOSCOPY;  Surgeon: Alvis Jourdain, MD;  Location: [facility] ENDOSCOPY;  Service: Gastroenterology;  Laterality: N/A;   FLEXIBLE SIGMOIDOSCOPY N/A 05/24/2022   Procedure: FLEXIBLE SIGMOIDOSCOPY;  Surgeon: Daina Drum, MD;  Location: [facility] ENDOSCOPY;  Service: Gastroenterology;  Laterality: N/A;   FLEXIBLE SIGMOIDOSCOPY N/A 09/03/2022   Procedure: FLEXIBLE SIGMOIDOSCOPY;  Surgeon: Alvis Jourdain, MD;  Location: [facility] ENDOSCOPY;  Service: Gastroenterology;  Laterality: N/A;   HEMOSTASIS CLIP PLACEMENT  03/13/2019   Procedure: HEMOSTASIS CLIP PLACEMENT;  Surgeon: Nannette Babe, MD;  Location[facility]OS[facility];  Service: Gastroenterology;;   HEMOSTASIS  CLIP PLACEMENT  05/23/2022   Procedure: HEMOSTASIS CLIP PLACEMENT;  Surgeon: Alvis Jourdain, MD;  Location: [facility] ENDOSCOPY;  Service: Gastroenterology;;   HEMOSTASIS CONTROL  05/24/2022   Procedure: HEMOSTASIS CONTROL;  Surgeon: Daina Drum, MD;  Location: [facility] ENDOSCOPY;  Service: Gastroenterology;;   HOT HEMOSTASIS N/A 05/23/2022   Procedure: HOT HEMOSTASIS (ARGON PLASMA COAGULATION/BICAP);  Surgeon: Alvis Jourdain, MD;  Location: [facility] ENDOSCOPY;  Service: Gastroenterology;  Laterality: N/A;   I & D EXTREMITY Left 05/05/2022   Procedure: IRRIGATION AND DEBRIDEMENT LEFT ARM AV FISTULA;  Surgeon: Young Hensen, MD;  Location: [facility] OR;  Service: Vascular;  Laterality: Left;   I & D EXTREMITY N/A 11/14/2022   Procedure: IRRIGATION AND DEBRIDEMENT OF LOWER EXTREMITY WOUND;  Surgeon: Timothy Ford, MD;  [facility] OR;  Service: Orthopedics;  Laterality: N/A;   INSERTION OF DIALYSIS CATHETER Right 04/02/2022    Procedure: INSERTION OF TUNNELED DIALYSIS CATHETER;  Surgeon: Margherita Shell, MD;  Location: [facility] OR;  Service: Vascular;  Laterality: Right;   IR FLUORO GUIDE CV LINE RIGHT  08/10/2023   IR VENOCAVAGRAM IVC  08/10/2023   KNEE ARTHROSCOPY Left 10/25/2006   POLYPECTOMY  03/13/2019   Procedure: POLYPECTOMY;  Surgeon: Nannette Babe, MD;  Location: [facility] ENDOSCOPY;  Service: Gastroenterology;;   REMOVAL OF STONES  02/03/2022   Procedure: REMOVAL OF STONES;  Surgeon: Alvis Jourdain, MD;  Location: [facility] ENDOSCOPY;  Service: Gastroenterology;;   REVISON OF ARTERIOVENOUS FISTULA Left 08/20/2022   Procedure: REVISON OF LEFT ARM ARTERIOVENOUS FISTULA;  Surgeon: Margherita Shell, MD;  [facility];  Service: Vascular;  Laterality: Left;   RIGHT HEART CATH N/A 07/24/2017   Procedure: RIGHT HEART CATH;  Surgeon: Mardell Shade, MD;  [facility];  Service: Cardiovascular;  Laterality: N/A;   RIGHT HEART CATHETERIZATION N/A 09/22/2013   Procedure: RIGHT HEART CATH;  Surgeon: Mardell Shade, MD;  Location: [facility] CATH LAB;  Service: Cardiovascular;  Laterality: N/A;   SHOULDER ARTHROSCOPY WITH OPEN ROTATOR CUFF REPAIR Right 03/14/2014   Procedure: RIGHT SHOULDER ARTHROSCOPY WITH BICEPS RELEASE, OPEN SUBSCAPULA REPAIR, OPEN SUPRASPINATUS REPAIR.;  Surgeon: Jasmine Mesi, MD;  Location: [facility] OR;  Service: Orthopedics;  Laterality: Right;   SPHINCTEROTOMY  02/03/2022   Procedure: SPHINCTEROTOMY;  Surgeon: Alvis Jourdain, MD;  Location: [facility] ENDOSCOPY;  Service: Gastroenterology;;   STUMP REVISION Right 06/13/2022   Procedure: REVISION RIGHT BELOW KNEE AMPUTATION;  Surgeon: Timothy Ford, MD;  Location: [facility] OR;  Service: Orthopedics;  Laterality: Right;   TEE WITHOUT CARDIOVERSION N/A 02/04/2022   Procedure: TRANSESOPHAGEAL ECHOCARDIOGRAM (TEE);  Surgeon: Mardell Shade, MD;  Location: [facility] ENDOSCOPY;  Service: Cardiovascular;  Laterality: N/A;   THROMBECTOMY W/ EMBOLECTOMY Left 08/20/2022   Procedure:  THROMBECTOMY OF LEFT ARM ARTERIOVENOUS FISTULA;  Surgeon: Margherita Shell, MD;  Location: [facility] OR;  Service: Vascular;  Laterality: Left;   TOE AMPUTATION Right 02/24/2018   GREAT TOE AND 2ND TOE AMPUTATION   TUBAL LIGATION  1970's   UPPER EXTREMITY VENOGRAPHY N/A 07/31/2023   Procedure: UPPER EXTREMITY VENOGRAPHY;  Surgeon: Carlene Che, MD;  [facility];  Service: Cardiovascular;  Laterality: N/A;      Inpatient Medications: Scheduled Meds:  amiodarone   200 mg Oral Daily   apixaban   5 mg Oral BID   atorvastatin   80 mg Oral Daily   Chlorhexidine  Gluconate Cloth  6 each Topical Q0600   clonazePAM   0.5 mg Oral BID   doxercalciferol  4 mcg Intravenous Q T,Th,Sa-HD   folic acid   1 mg Oral Daily   insulin  aspart  0-6 Units Subcutaneous TID WC   insulin  aspart  4 Units Subcutaneous TID WC   insulin  glargine-yfgn  18 Units Subcutaneous Daily   levothyroxine   50 mcg Oral QAC breakfast   liver oil-zinc  oxide   Topical BID   melatonin  3 mg Oral QHS   pantoprazole   40 mg Oral Daily   polyethylene glycol  17 g Oral Daily   sevelamer  carbonate  1,600 mg Oral TID WC   venlafaxine  XR  75 mg Oral Q breakfast   Continuous Infusions:  PRN Meds: acetaminophen  **OR** acetaminophen , alteplase , heparin , heparin , methocarbamol , midodrine , nitroGLYCERIN , oxyCODONE -acetaminophen , prochlorperazine   Allergies:    Allergies  Allergen Reactions   Cephalexin  Diarrhea and Other (See Comments)   Codeine Nausea And Vomiting and Other (See Comments)    Social History:   Social History   Socioeconomic History   Marital status: Married    Spouse name: Gaylin Ke   Number of children: 3   Years of education: 12th   Highest education level: Not on file  Occupational History    Employer: UNEMPLOYED  Tobacco Use   Smoking status: Former    Current packs/day: 0.00    Average packs/day: 3.0 packs/day for 32.0 years (96.0 ttl pk-yrs)    Types: Cigarettes    Start date: 10/24/1965    Quit  date: 10/24/1997    Years since quitting: 25.9   Smokeless tobacco: Never  Vaping Use   Vaping status: Never Used  Substance and Sexual Activity   Alcohol use: Not Currently   Drug use: No   Sexual activity: Not Currently    Birth control/protection: Surgical    Comment: Hysterectomy  Other Topics Concern   Not on file  Social History Narrative   Pt lives at home with her spouse.Caffeine Use- 3 sodas daily.    Social Drivers of Corporate investment banker Strain: Low Risk  (08/16/2021)   Overall Financial Resource Strain (CARDIA)    Difficulty of Paying Living Expenses: Not hard at all  Food Insecurity: No Food Insecurity (10/01/2023)   Hunger Vital Sign    Worried About Running Out of Food in the Last Year: Never true    Ran Out of Food in the Last Year: Never true  Transportation Needs: No Transportation Needs (10/01/2023)   PRAPARE - Administrator, Civil Service (Medical): No    Lack of Transportation (Non-Medical): No  Physical Activity: Inactive (08/16/2021)   Exercise Vital Sign    Days of Exercise per Week: 0 days    Minutes of Exercise per Session: 0 min  Stress: No Stress Concern Present (08/16/2021)   [employer] of Occupational Health - Occupational Stress Questionnaire    Feeling of Stress : Not at all  Social Connections: Socially Isolated (10/01/2023)   Social Connection and Isolation Panel [NHANES]    Frequency of Communication with Friends and Family: Once a week    Frequency of Social Gatherings with Friends and Family: Once a week    Attends Religious Services: Never    Database administrator or Organizations: No    Attends Banker Meetings: Never    Marital Status: Married  Catering manager Violence: Not At Risk (10/01/2023)   Humiliation, Afraid, Rape, and Kick questionnaire    Fear of Current or Ex-Partner: No    Emotionally Abused: No    Physically Abused: No  Sexually Abused: No    Family History:    Family History   Problem Relation Age of Onset   Heart attack Mother 68     ROS:  Please see the history of present illness.   All other ROS reviewed and negative.     Physical Exam/Data:   Vitals:   10/06/23 1635 10/06/23 2025 10/07/23 0425 10/07/23 0838  BP: (!) 140/68 (!) 104/46 122/63 (!) 117/49  Pulse: 84 95 84 83  Resp: 18 18 19 18   Temp: 98.3 F (36.8 C) 98.8 F (37.1 C) 98.7 F (37.1 C) 98.5 F (36.9 C)  TempSrc: Oral Oral Oral Oral  SpO2: 100% 100% 100% 100%  Weight:      Height:        Intake/Output Summary (Last 24 hours) at 10/07/2023 0841 Last data filed at 10/06/2023 1530 Gross per 24 hour  Intake --  Output 2800 ml  Net -2800 ml      10/06/2023    3:30 PM 10/06/2023   11:05 AM 10/03/2023    5:45 PM  Last 3 Weights  Weight (lbs) 167 lb 15.9 oz 174 lb 2.6 oz 162 lb 7.7 oz  Weight (kg) 76.2 kg 79 kg 73.7 kg     Body mass index is 27.96 kg/m.  General:  Well nourished, well developed, in no acute distress HEENT: normal Neck: no JVD Vascular: No carotid bruits; Distal pulses 2+ bilaterally Cardiac:  normal S1, S2; RRR; no murmur  Lungs:  clear to auscultation bilaterally, no wheezing, rhonchi or rales  Abd: soft, nontender, no hepatomegaly  Ext: no edema Musculoskeletal:  No deformities, BUE and BLE strength normal and equal Skin: warm and dry  Neuro:  CNs 2-12 intact, no focal abnormalities noted Psych:  Normal affect     Laboratory Data:  High Sensitivity Troponin:  No results for input(s): "TROPONINIHS" in the last 720 hours.   Chemistry Recent Labs  Lab 10/03/23 0418 10/04/23 0417 10/05/23 0438 10/06/23 0413  NA 128* 131* 130* 130*  K 4.0 3.7 4.1 4.1  CL 89* 95* 94* 93*  CO2 21* 26 26 24   GLUCOSE 224* 295* 94 61*  BUN 55* 27* 42* 49*  CREATININE 6.35* 3.71* 4.74* 5.76*  CALCIUM  8.2* 8.4* 8.8* 9.0  MG 2.4  --   --   --   GFRNONAA 6* 12* 9* 7*  ANIONGAP 18* 10 10 13     Recent Labs  Lab 10/01/23 1337 10/03/23 0418 10/04/23 0417  10/05/23 0438 10/06/23 0413  PROT 7.5  --   --   --   --   ALBUMIN  3.1*   < > 2.6* 2.7* 2.4*  AST 17  --   --   --   --   ALT 15  --   --   --   --   ALKPHOS 207*  --   --   --   --   BILITOT 1.0  --   --   --   --    < > = values in this interval not displayed.   Lipids No results for input(s): "CHOL", "TRIG", "HDL", "LABVLDL", "LDLCALC", "CHOLHDL" in the last 168 hours.  Hematology Recent Labs  Lab 10/05/23 0438 10/06/23 0413 10/07/23 0432  WBC 6.1 5.4 5.1  RBC 4.00 3.75* 3.63*  HGB 11.0* 10.1* 9.8*  HCT 36.3 33.5* 33.0*  MCV 90.8 89.3 90.9  MCH 27.5 26.9 27.0  MCHC 30.3 30.1 29.7*  RDW 17.8* 17.7* 17.8*  PLT 230 205  191   Thyroid  No results for input(s): "TSH", "FREET4" in the last 168 hours.  BNPNo results for input(s): "BNP", "PROBNP" in the last 168 hours.  DDimer No results for input(s): "DDIMER" in the last 168 hours.   Radiology/Studies:  DG Abd Portable 2V Result Date: 10/05/2023 CLINICAL DATA:  Constipation.  Nausea.  Abdominal distention. EXAM: PORTABLE ABDOMEN - 2 VIEW COMPARISON:  12/08/2022 FINDINGS: Paucity of intestinal gas with no dilated bowel loops seen. Normal amount of stool. No free peritoneal air. Double-lumen right sided double-lumen catheter tip at the superior cavoatrial junction. Coronary artery stents. Tortuous and partially calcified thoracic aorta. Lumbar and thoracic spine degenerative changes. IMPRESSION: Nonobstructive bowel gas pattern with a normal amount of stool. Electronically Signed   By: Catherin Closs M.D.   On: 10/05/2023 14:25     Assessment and Plan:     1.Chronic HFrEF -4 /2024 echo: LVE 30-35%, global hypokinesis with apical akinesis. Mild RV dysfunction.  - ESRD, fluid status is managed by HD - followed as outpatient in HF clinic. From notes medical therapy has been historically limited by renal dysfunction and low bp's, has been on midodrine . HF note 03/2023 mentions being only on coreg  6.25mg  bid with holding AM dose day of  HD. However looks like this was stopped at some point, not on 07/2023 d/c summary, assume due to ongoing low bp - She is followed closely by HF clinic as outpatient, not able to tolerate HF regimen. ESRD, volume status controlled by dialysis.  - update echo as this had been ordered as outpatient and has not been completed.    2. CAD -  Known CAD w/ h/o remote MI in 2000 s/p BMS to RCA and again in 2007 w/ BMS to RCA  - Admitted 10/22 NSTEM (hstrop 247) Echo with decline in EF to 30-35% and new inferior WMA. Suspect progression in RCA disease. No cath due CKD IV at the time - 04/2023 nuclear stress: inferior infarct, apical infarct, no current ischemia - no acute issues this admissin   3. Afib - she is on amio at home 200mg  daily, eliquis  5mg  bid - EKG this admit shows NSR,       For questions or updates, please contact [facility] Please consult [email or web address] for contact info under    Signed, Armida Lander, MD  10/07/2023 8:41 AM

## 2023-10-07 NOTE — Plan of Care (Signed)
  Problem: Coping: Goal: Level of anxiety will decrease Outcome: Progressing   Problem: Pain Managment: Goal: General experience of comfort will improve and/or be controlled Outcome: Progressing   Problem: Coping: Goal: Level of anxiety will decrease Outcome: Progressing   Problem: Coping: Goal: Level of anxiety will decrease Outcome: Progressing   Problem: Pain Managment: Goal: General experience of comfort will improve and/or be controlled Outcome: Progressing

## 2023-10-07 NOTE — Progress Notes (Signed)
 PROGRESS NOTE  Susan [name]:[id number] DOB: 24-Feb-1951 DOA: 10/01/2023 PCP: Austine Lefort, MD  Brief History:  73 y.o. female, with PMH significant for ESRD-HD-TTS, PAF on Eliquis , CAD status post PCI, chronic systolic CHF with EF 30-35%, DM2, HTN, HLD, hypothyroidism, anemia of CKD, right BKA, and depression/anxiety with recent hospitalization on 3/12, ESBL UTI and bacteremia.  The right subclavian HD catheter exchanged during that admission.   -Pt was sent from HD center for evaluation, patient reports she has not been feeling well, having nausea, occasional vomiting, poor appetite, constipation, as well she does endorse dysuria, was found to be very sick at dialysis center, so was sent to ED for evaluation. - In ED, her UA was positive, she does endorse dysuria, was positive, no leukocytosis, afebrile, sodium was low at 128, potassium at 3.3, CT abdomen pelvis with diverticulosis, and constipation, treated hospitalist consulted to admit.   Assessment/Plan: UTI  - Patient with known history of ESBL UTI and bacteremia in the past, she presents with dysuria, had leukocytosis - UA is positive - Treated with meropenem  - full treatment completed on 5/11 - DC was held due to issues with constipation, vomiting and hypoglycemia which we are addressing - give additional merrem  dose today    ESRD-HD-TTS -She missed her HD as she was sent from dialysis center due to her illness. - Location for emergency dialysis overnight, renal consult requested in epic to dialyze tomorrow. - Patient right subclavian hemodialysis catheter was changed out by IR 3/17 because of bacteremia during previous admission - next treatment 5/15  Intractable vomiting -increase pantoprazole  to bid -start compazine  around the clock - still having difficulty tolerating diet   Chronic hyponatremia -At baseline, management with dialysis   Hypokalemia -management with dialysis   Chronic  hypotension Chronic systolic CHF -Continue with home midodrine  before dialysis days -HD treatment today for volume removal -Most recent echo from April 2024 with EF 30 to 35%, moderate to severe LV dysfunction -previously on coreg  -appreciate cardiology consult -repeat Echo   PAF  Heart rate controlled with amiodarone  Chronically anticoagulated with apixaban  -currently in sinus  Chronic respiratory failure with hypoxia -on 2L at night at home -stable    CAD, HLD -Apixaban  and statin -no angina presently   Type 2 diabetes mellitus uncontrolled with hypoglycemia - 10/01/23 A1c 6.8%  - did not receive lantus  5/13 - restart semglee  18 units on 5/14  Hypoglycemia from insulin  - pt had a severe hypoglycemia episode on 5/12 where BS dropped to 42, I treated at bedside - we have greatly reduced her insulin  doses as she is no longer having glucotoxicity - continue frequent CBG monitoring as ordered 5x per day   Anemia of CKD Hemoglobin at baseline, management per renal   Hypothyroidism Continue Synthroid    Depression/anxiety Continue Klonopin , melatonin nightly, Effexor  daily   Constipation concern for fecal impaction -started on bowel regimen -5/9 molasses enema ordered -check portable abd xrays on 5/12- normal amount of stool reported - had BM on 10/05/23 -continue miralax  daily -give bisacodyl        Family Communication:   no Family at bedside  Consultants:  renal, cardiology  Code Status:  FULL   DVT Prophylaxis: apixaban    Procedures: As Listed in Progress Note Above  Antibiotics: Merrem  5/8>>5/11      Subjective: Pt complains of dry heaves.  Had BM 5/12.  Denies cp, sob,   Objective: Vitals:   10/06/23 1635  10/06/23 2025 10/07/23 0425 10/07/23 0838  BP: (!) 140/68 (!) 104/46 122/63 (!) 117/49  Pulse: 84 95 84 83  Resp: 18 18 19 18   Temp: 98.3 F (36.8 C) 98.8 F (37.1 C) 98.7 F (37.1 C) 98.5 F (36.9 C)  TempSrc: Oral Oral Oral Oral   SpO2: 100% 100% 100% 100%  Weight:      Height:        Intake/Output Summary (Last 24 hours) at 10/07/2023 1136 Last data filed at 10/07/2023 0800 Gross per 24 hour  Intake 300 ml  Output 2800 ml  Net -2500 ml   Weight change:  Exam:  General:  Pt is alert, follows commands appropriately, not in acute distress HEENT: No icterus, No thrush, No neck mass, [place]/AT Cardiovascular: RRR, S1/S2, no rubs, no gallops Respiratory: bibasilar rales.  No wheeze Abdomen: Soft/+BS, non tender, non distended, no guarding Extremities: No edema, No lymphangitis, No petechiae, No rashes, no synovitis   Data Reviewed: I have personally reviewed following labs and imaging studies Basic Metabolic Panel: Recent Labs  Lab 10/03/23 0418 10/04/23 0417 10/05/23 0438 10/06/23 0413 10/07/23 0432  NA 128* 131* 130* 130* 130*  K 4.0 3.7 4.1 4.1 4.5  CL 89* 95* 94* 93* 96*  CO2 21* 26 26 24 23   GLUCOSE 224* 295* 94 61* 404*  BUN 55* 27* 42* 49* 29*  CREATININE 6.35* 3.71* 4.74* 5.76* 3.88*  CALCIUM  8.2* 8.4* 8.8* 9.0 8.1*  MG 2.4  --   --   --   --   PHOS 5.3* 3.1 3.6 4.2 4.0   Liver Function Tests: Recent Labs  Lab 10/01/23 1337 10/03/23 0418 10/04/23 0417 10/05/23 0438 10/06/23 0413 10/07/23 0432  AST 17  --   --   --   --   --   ALT 15  --   --   --   --   --   ALKPHOS 207*  --   --   --   --   --   BILITOT 1.0  --   --   --   --   --   PROT 7.5  --   --   --   --   --   ALBUMIN  3.1* 2.6* 2.6* 2.7* 2.4* 2.4*   No results for input(s): "LIPASE", "AMYLASE" in the last 168 hours. No results for input(s): "AMMONIA" in the last 168 hours. Coagulation Profile: No results for input(s): "INR", "PROTIME" in the last 168 hours. CBC: Recent Labs  Lab 10/03/23 0418 10/04/23 0417 10/05/23 0438 10/06/23 0413 10/07/23 0432  WBC 5.8 5.4 6.1 5.4 5.1  HGB 10.5* 9.8* 11.0* 10.1* 9.8*  HCT 33.9* 33.2* 36.3 33.5* 33.0*  MCV 90.2 90.2 90.8 89.3 90.9  PLT 313 274 230 205 191   Cardiac  Enzymes: No results for input(s): "CKTOTAL", "CKMB", "CKMBINDEX", "TROPONINI" in the last 168 hours. BNP: Invalid input(s): "POCBNP" CBG: Recent Labs  Lab 10/06/23 1642 10/06/23 2029 10/07/23 0308 10/07/23 0733 10/07/23 1114  GLUCAP 88 370* 426* 360* 339*   HbA1C: No results for input(s): "HGBA1C" in the last 72 hours. Urine analysis:    Component Value Date/Time   COLORURINE AMBER (A) 10/01/2023 1505   APPEARANCEUR CLOUDY (A) 10/01/2023 1505   LABSPEC 1.011 10/01/2023 1505   PHURINE 5.0 10/01/2023 1505   GLUCOSEU NEGATIVE 10/01/2023 1505   HGBUR MODERATE (A) 10/01/2023 1505   BILIRUBINUR NEGATIVE 10/01/2023 1505   BILIRUBINUR negative 06/05/2020 1334   KETONESUR NEGATIVE 10/01/2023 1505  PROTEINUR 100 (A) 10/01/2023 1505   UROBILINOGEN 0.2 06/05/2020 1334   UROBILINOGEN 0.2 04/01/2014 1659   NITRITE NEGATIVE 10/01/2023 1505   LEUKOCYTESUR LARGE (A) 10/01/2023 1505   Sepsis Labs: @LABRCNTIP (procalcitonin:4,lacticidven:4) ) Recent Results (from the past 240 hours)  Culture, blood (Routine X 2) w Reflex to ID Panel     Status: None   Collection Time: 10/01/23  2:54 PM   Specimen: BLOOD  Result Value Ref Range Status   Specimen Description BLOOD BLOOD LEFT ARM  Final   Special Requests   Final    BOTTLES DRAWN AEROBIC ONLY Blood Culture results may not be optimal due to an inadequate volume of blood received in culture bottles   Culture   Final    NO GROWTH 5 DAYS Performed at [facility], [address], [place], [place] [address]    Report Status 10/06/2023 FINAL  Final  Culture, blood (Routine X 2) w Reflex to ID Panel     Status: None   Collection Time: 10/01/23  2:54 PM   Specimen: BLOOD  Result Value Ref Range Status   Specimen Description BLOOD BLOOD RIGHT ARM  Final   Special Requests   Final    BOTTLES DRAWN AEROBIC ONLY Blood Culture results may not be optimal due to an inadequate volume of blood received in culture bottles   Culture   Final    NO  GROWTH 5 DAYS Performed at [facility], [address] [place], [place] [address]    Report Status 10/06/2023 FINAL  Final  MRSA Next Gen by PCR, Nasal     Status: None   Collection Time: 10/02/23  3:52 AM   Specimen: Nasal Mucosa; Nasal Swab  Result Value Ref Range Status   MRSA by PCR Next Gen NOT DETECTED NOT DETECTED Final    Comment: (NOTE) The GeneXpert MRSA Assay (FDA approved for NASAL specimens only), is one component of a comprehensive MRSA colonization surveillance program. It is not intended to diagnose MRSA infection nor to guide or monitor treatment for MRSA infections. Test performance is not FDA approved in patients less than 64 years old. Performed at [facility], [address] [place], [place] [address]      Scheduled Meds:  amiodarone   200 mg Oral Daily   apixaban   5 mg Oral BID   atorvastatin   80 mg Oral Daily   bisacodyl   10 mg Rectal Once   Chlorhexidine  Gluconate Cloth  6 each Topical Q0600   clonazePAM   0.5 mg Oral BID   doxercalciferol  4 mcg Intravenous Q T,Th,Sa-HD   folic acid   1 mg Oral Daily   insulin  aspart  0-6 Units Subcutaneous TID WC   insulin  aspart  4 Units Subcutaneous TID WC   insulin  glargine-yfgn  18 Units Subcutaneous Daily   levothyroxine   50 mcg Oral QAC breakfast   liver oil-zinc  oxide   Topical BID   melatonin  3 mg Oral QHS   pantoprazole   40 mg Oral BID   polyethylene glycol  17 g Oral Daily   sevelamer  carbonate  1,600 mg Oral TID WC   venlafaxine  XR  75 mg Oral Q breakfast   Continuous Infusions:  Procedures/Studies: DG Abd Portable 2V Result Date: 10/05/2023 CLINICAL DATA:  Constipation.  Nausea.  Abdominal distention. EXAM: PORTABLE ABDOMEN - 2 VIEW COMPARISON:  12/08/2022 FINDINGS: Paucity of intestinal gas with no dilated bowel loops seen. Normal amount of stool. No free peritoneal air. Double-lumen right sided double-lumen catheter tip at the  superior cavoatrial junction. Coronary artery stents. Tortuous and  partially calcified thoracic aorta. Lumbar and thoracic spine degenerative changes. IMPRESSION: Nonobstructive bowel gas pattern with a normal amount of stool. Electronically Signed   By: Catherin Closs M.D.   On: 10/05/2023 14:25   CT ABDOMEN PELVIS W CONTRAST Result Date: 10/01/2023 CLINICAL DATA:  Altered mental status.  Weakness. EXAM: CT ABDOMEN AND PELVIS WITH CONTRAST TECHNIQUE: Multidetector CT imaging of the abdomen and pelvis was performed using the standard protocol following bolus administration of intravenous contrast. RADIATION DOSE REDUCTION: This exam was performed according to the departmental dose-optimization program which includes automated exposure control, adjustment of the mA and/or kV according to patient size and/or use of iterative reconstruction technique. CONTRAST:  OMNIPAQUE  IOHEXOL  300 MG/ML  SOLN COMPARISON:  August 09, 2023. FINDINGS: Lower chest: Small right pleural effusion is noted. Minimal bilateral posterior basilar subsegmental atelectasis is noted. Hepatobiliary: Cholelithiasis is noted. No biliary dilatation is noted. Multiple small calcified granulomas are noted in the hepatic parenchyma. Pancreas: Unremarkable. No pancreatic ductal dilatation or surrounding inflammatory changes. Spleen: Multiple small calcified granulomas are noted. Adrenals/Urinary Tract: Adrenal glands appear normal. Bilateral renal cortical scarring is noted. Right renal cyst is noted. No hydronephrosis or renal obstruction is noted. Urinary bladder is decompressed. Stomach/Bowel: Stomach is unremarkable. There is no evidence of bowel obstruction. The appendix appears normal. Sigmoid diverticulosis is noted without inflammation. Moderate amount of stool seen in the rectum concerning for impaction. Vascular/Lymphatic: Aortic atherosclerosis. No enlarged abdominal or pelvic lymph nodes. Reproductive: Status post hysterectomy. No adnexal masses. Other: No ascites or hernia. Musculoskeletal: No acute or  significant osseous findings. IMPRESSION: Small right pleural effusion. Cholelithiasis. Bilateral renal cortical scarring. Sigmoid diverticulosis without inflammation. Moderate amount of stool seen in rectum concerning for impaction. Aortic Atherosclerosis (ICD10-I70.0). Electronically Signed   By: Rosalene Colon M.D.   On: 10/01/2023 16:07   CT HEAD WO CONTRAST ( ) Result Date: 10/01/2023 CLINICAL DATA:  Delirium. EXAM: CT HEAD WITHOUT CONTRAST TECHNIQUE: Contiguous axial images were obtained from the base of the skull through the vertex without intravenous contrast. RADIATION DOSE REDUCTION: This exam was performed according to the departmental dose-optimization program which includes automated exposure control, adjustment of the mA and/or kV according to patient size and/or use of iterative reconstruction technique. COMPARISON:  Head CT 01/15/2022 FINDINGS: Brain: There is no evidence of an acute infarct, intracranial hemorrhage, mass, midline shift, or extra-axial fluid collection. There is mild cerebral atrophy. Patchy cerebral white matter hypodensities have likely mildly progressed and are nonspecific but compatible with moderate chronic small vessel ischemic disease. 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 Vascular: Calcified atherosclerosis at the skull base. No hyperdense vessel. Skull: No fracture or suspicious lesion. Sinuses/Orbits: Visualized paranasal sinuses and mastoid air cells are clear. Bilateral cataract extraction. Other: None. IMPRESSION: 1. No evidence of acute intracranial abnormality. 2. Moderate chronic small vessel ischemic disease. Electronically Signed   By: Aundra [name] M.D.   On: 10/01/2023 15:59   DG Chest Port 1 View Result Date: 10/01/2023 CLINICAL DATA:  Hypoxia. EXAM: PORTABLE CHEST 1 VIEW COMPARISON:  08/13/2023. FINDINGS: Stable cardiomegaly. Right IJ dialysis catheter tip overlies the right atrium, unchanged. Aortic atherosclerosis. Central pulmonary  vascular congestion with bilateral interstitial prominence, most pronounced at the lung bases, which could reflect interstitial pulmonary edema. Possible small left pleural effusion. No pneumothorax. No acute osseous abnormality. IMPRESSION: 1. Cardiomegaly with central pulmonary vascular congestion and findings suggestive of interstitial pulmonary edema. 2. Possible small left  pleural effusion. Electronically Signed   By: Mannie Seek M.D.   On: 10/01/2023 13:59    Demaris Fillers, DO  [facility]  If 7PM-7AM, please contact night-coverage [email or web address] Password [facility] 10/07/2023, 11:36 AM   LOS: 6 days

## 2023-10-07 NOTE — Inpatient Diabetes Management (Signed)
 Inpatient Diabetes Program Recommendations  AACE/ADA: New Consensus Statement on Inpatient Glycemic Control (2015)  Target Ranges:  Prepandial:   less than 140 mg/dL      Peak postprandial:   less than 180 mg/dL (1-2 hours)      Critically ill patients:  140 - 180 mg/dL   Lab Results  Component Value Date   GLUCAP 360 (H) 10/07/2023   HGBA1C 6.8 (H) 10/01/2023    Latest Reference Range & Units 10/06/23 03:17 10/06/23 07:18 10/06/23 16:42 10/06/23 20:29 10/07/23 03:08 10/07/23 07:33  Glucose-Capillary 70 - 99 mg/dL 76 86 88 161 (H) 096 (H) 360 (H)  (H): Data is abnormally high  Diabetes history: DM2 Outpatient Diabetes medications: Lantus  31 QAM, Novolog  0-15 TID  Current orders for Inpatient glycemic control: Semglee  18 units daily, Novolog  4 units tid meal coverage, Novolog  0-6 units tid correction  Inpatient Diabetes Program Recommendations:   Noted patient did not receive Semglee  insulin  yesterday. Fasting CBG 426-360 this am. Will follow while hospitalized.  Thank you, Javonna Balli E. Jaylan Duggar, RN, MSN, CDCES  Diabetes Coordinator Inpatient Glycemic Control Team Team Pager [phone] (8am-5pm) 10/07/2023 10:23 AM

## 2023-10-07 NOTE — Progress Notes (Signed)
 Patient place on Contact precaution per Infection control policy,patient and family educated on contact Isolation precaution,verbalized understanding. Educational care notes given.Plan of care on going.

## 2023-10-07 NOTE — Progress Notes (Signed)
 [facility] Progress Note    Assessment/ Plan:   # AMS/UTI: s/p meropenem , bcx ngtd-per primary.  We discussed preventive measures today- peri-care BID, wiping front to back with a fresh surface every time.  Avoid perfumes and sprays which can irritate perineal/ urethral skin.     # ESRD: on HD TTS, continue dialysis per schedule. Had HD yesterday.   # Hypertension/volume: getting under EDW, recommend readjusting EDW to post treatment weight from 5/10: around 74.8kg   # Anemia of ESRD: Just received erythropoietin as outpatient, monitor hemoglobin-stable   # Secondary hyperparathyroidism, metabolic Bone Disease: Continue calcitriol  and sevelamer .  Po4 acceptable  OP HD orders: Dialyzes at [facility], [facility], TTS, 4 hr, EDW 76.2 kg. HD 2k2ca [place] , 400/800 Heparin  .  Access RIJ TDC. Mircera 100 mcg on 5/3 Calcitriol  2.5 mcg three times per week  Subjective:     S/p HD yesterday.  2.8L removed.     Objective:   BP (!) 117/49 (BP Location: Right Arm)   Pulse 83   Temp 98.5 F (36.9 C) (Oral)   Resp 18   Ht 5\' 5"  (1.651 m)   Wt 76.2 kg   SpO2 100%   BMI 27.96 kg/m   Intake/Output Summary (Last 24 hours) at 10/07/2023 1000 Last data filed at 10/07/2023 0800 Gross per 24 hour  Intake 300 ml  Output 2800 ml  Net -2500 ml   Weight change:   Physical Exam: Gen: NAD, lying in bed CVS: Normal rate, no rub Resp: Bilateral chest rise with no increased work of breathing Abd: soft, nondistended Ext: rt bka, trace edema of the bilateral lower extremities Neuro: awake, alert Dialysis access: RIJ [facility] c/d/i  Imaging: DG Abd Portable 2V Result Date: 10/05/2023 CLINICAL DATA:  Constipation.  Nausea.  Abdominal distention. EXAM: PORTABLE ABDOMEN - 2 VIEW COMPARISON:  12/08/2022 FINDINGS: Paucity of intestinal gas with no dilated bowel loops seen. Normal amount of stool. No free peritoneal air. Double-lumen right sided double-lumen catheter tip at the superior  cavoatrial junction. Coronary artery stents. Tortuous and partially calcified thoracic aorta. Lumbar and thoracic spine degenerative changes. IMPRESSION: Nonobstructive bowel gas pattern with a normal amount of stool. Electronically Signed   By: Catherin Closs M.D.   On: 10/05/2023 14:25    Labs: BMET Recent Labs  Lab 10/01/23 1337 10/02/23 0356 10/03/23 0418 10/04/23 0417 10/05/23 0438 10/06/23 0413 10/07/23 0432  NA 128* 132* 128* 131* 130* 130* 130*  K 3.3* 3.7 4.0 3.7 4.1 4.1 4.5  CL 87* 90* 89* 95* 94* 93* 96*  CO2 25 19* 21* 26 26 24 23   GLUCOSE 163* 220* 224* 295* 94 61* 404*  BUN 34* 44* 55* 27* 42* 49* 29*  CREATININE 4.37* 4.89* 6.35* 3.71* 4.74* 5.76* 3.88*  CALCIUM  9.6 8.6* 8.2* 8.4* 8.8* 9.0 8.1*  PHOS  --   --  5.3* 3.1 3.6 4.2 4.0   CBC Recent Labs  Lab 10/04/23 0417 10/05/23 0438 10/06/23 0413 10/07/23 0432  WBC 5.4 6.1 5.4 5.1  HGB 9.8* 11.0* 10.1* 9.8*  HCT 33.2* 36.3 33.5* 33.0*  MCV 90.2 90.8 89.3 90.9  PLT 274 230 205 191    Medications:     amiodarone   200 mg Oral Daily   apixaban   5 mg Oral BID   atorvastatin   80 mg Oral Daily   Chlorhexidine  Gluconate Cloth  6 each Topical Q0600   clonazePAM   0.5 mg Oral BID   doxercalciferol  4 mcg Intravenous Q T,Th,Sa-HD  folic acid   1 mg Oral Daily   insulin  aspart  0-6 Units Subcutaneous TID WC   insulin  aspart  4 Units Subcutaneous TID WC   insulin  glargine-yfgn  18 Units Subcutaneous Daily   levothyroxine   50 mcg Oral QAC breakfast   liver oil-zinc  oxide   Topical BID   melatonin  3 mg Oral QHS   pantoprazole   40 mg Oral Daily   polyethylene glycol  17 g Oral Daily   sevelamer  carbonate  1,600 mg Oral TID WC   venlafaxine  XR  75 mg Oral Q breakfast      [facility] 10/07/2023, 10:00 AM

## 2023-10-07 NOTE — Progress Notes (Signed)
 PT Cancellation Note  Patient Details Name: Susan [name] MRN: [id number] DOB: 1950-11-13   Cancelled Treatment:    Reason Eval/Treat Not Completed: Patient declined, no reason specified.  Attempted therapy in  AM and PM, patient refused due to not feeling well. Will check back tomorrow.   2:45 PM, 10/07/23 Walton Guppy, MPT Physical Therapist with [facility] [phone] office [phone] mobile phone

## 2023-10-07 NOTE — Progress Notes (Signed)
 Nurse at bedside,patient alert and oriented times four. Patient c/o nausea this am,compazine  10 mg's IV given per MAR prn.Blood pressure 117/49,Dr Tat notified. Plan of care on going.

## 2023-10-08 ENCOUNTER — Telehealth (HOSPITAL_COMMUNITY): Payer: Self-pay | Admitting: Internal Medicine

## 2023-10-08 ENCOUNTER — Inpatient Hospital Stay (HOSPITAL_COMMUNITY)

## 2023-10-08 DIAGNOSIS — N390 Urinary tract infection, site not specified: Principal | ICD-10-CM

## 2023-10-08 DIAGNOSIS — Z1612 Extended spectrum beta lactamase (ESBL) resistance: Secondary | ICD-10-CM

## 2023-10-08 DIAGNOSIS — B9629 Other Escherichia coli [E. coli] as the cause of diseases classified elsewhere: Secondary | ICD-10-CM

## 2023-10-08 DIAGNOSIS — I480 Paroxysmal atrial fibrillation: Secondary | ICD-10-CM

## 2023-10-08 DIAGNOSIS — R1110 Vomiting, unspecified: Secondary | ICD-10-CM

## 2023-10-08 LAB — GLUCOSE, CAPILLARY
Glucose-Capillary: 169 mg/dL — ABNORMAL HIGH (ref 70–99)
Glucose-Capillary: 218 mg/dL — ABNORMAL HIGH (ref 70–99)
Glucose-Capillary: 262 mg/dL — ABNORMAL HIGH (ref 70–99)
Glucose-Capillary: 130 mg/dL — ABNORMAL HIGH (ref 70–99)
Glucose-Capillary: 156 mg/dL — ABNORMAL HIGH (ref 70–99)

## 2023-10-08 LAB — RENAL FUNCTION PANEL
Albumin: 2.3 g/dL — ABNORMAL LOW (ref 3.5–5.0)
Anion gap: 12 (ref 5–15)
BUN: 40 mg/dL — ABNORMAL HIGH (ref 8–23)
CO2: 25 mmol/L (ref 22–32)
Calcium: 8.8 mg/dL — ABNORMAL LOW (ref 8.9–10.3)
Chloride: 95 mmol/L — ABNORMAL LOW (ref 98–111)
Creatinine, Ser: 5.25 mg/dL — ABNORMAL HIGH (ref 0.44–1.00)
GFR, Estimated: 8 mL/min — ABNORMAL LOW (ref >60)
Glucose, Bld: 168 mg/dL — ABNORMAL HIGH (ref 70–99)
Phosphorus: 4.2 mg/dL (ref 2.5–4.6)
Potassium: 4 mmol/L (ref 3.5–5.1)
Sodium: 132 mmol/L — ABNORMAL LOW (ref 135–145)

## 2023-10-08 NOTE — Progress Notes (Signed)
 PROGRESS NOTE  Susan [name] GNF:[id number] DOB: Aug 19, 1950 DOA: 10/01/2023 PCP: Austine Lefort, MD  Brief History:  73 y.o. female, with PMH significant for ESRD-HD-TTS, PAF on Eliquis , CAD status post PCI, chronic systolic CHF with EF 30-35%, DM2, HTN, HLD, hypothyroidism, anemia of CKD, right BKA, and depression/anxiety with recent hospitalization on 3/12, ESBL UTI and bacteremia.  The right subclavian HD catheter exchanged during that admission.   -Pt was sent from HD center for evaluation, patient reports she has not been feeling well, having nausea, occasional vomiting, poor appetite, constipation, as well she does endorse dysuria, was found to be very sick at dialysis center, so was sent to ED for evaluation. - In ED, her UA was positive, she does endorse dysuria, was positive, no leukocytosis, afebrile, sodium was low at 128, potassium at 3.3, CT abdomen pelvis with diverticulosis, and constipation, treated hospitalist consulted to admit.   Assessment/Plan:  UTI  - Patient with known history of ESBL UTI and bacteremia in the past, she presents with dysuria, had leukocytosis - UA is positive - Treated with meropenem  x 5 days - DC was held due to issues with constipation, vomiting and hypoglycemia which we are addressing      ESRD-HD-TTS -She missed her HD as she was sent from dialysis center due to her illness. - Location for emergency dialysis overnight, renal consult requested in epic to dialyze tomorrow. - Patient right subclavian hemodialysis catheter was changed out by IR 3/17 because of bacteremia during previous admission - received HD 5/15   Intractable vomiting -increase pantoprazole  to bid -continue compazine  around the clock -improving slowly   Chronic hyponatremia -At baseline, management with dialysis -overall stable in low 130s   Hypokalemia -management with dialysis   Chronic hypotension Chronic systolic CHF -Continue with home midodrine   before dialysis days -HD treatment today for volume removal -Most recent echo from April 2024 with EF 30 to 35%, moderate to severe LV dysfunction -previously on coreg  -appreciate cardiology consult -repeat Echo   PAF  Heart rate controlled with amiodarone  Chronically anticoagulated with apixaban  -currently in sinus   Chronic respiratory failure with hypoxia -on 2L at night at home -stable on RA during day time    CAD, HLD -Apixaban  and statin -no angina presently   Type 2 diabetes mellitus uncontrolled with hypoglycemia - 10/01/23 A1c 6.8%  - did not receive lantus  5/13 - restart semglee  18 units on 5/14 - no further hypoglycemia   Hypoglycemia from insulin  - pt had a severe hypoglycemia episode on 5/12 where BS dropped to 42, I treated at bedside - we have greatly reduced her insulin  doses as she is no longer having glucotoxicity - continue frequent CBG monitoring as ordered 5x per day   Anemia of CKD Hemoglobin at baseline, management per renal   Hypothyroidism Continue Synthroid    Depression/anxiety Continue Klonopin , melatonin nightly, Effexor  daily   Constipation concern for fecal impaction -started on bowel regimen -5/9 molasses enema ordered -check portable abd xrays on 5/12- normal amount of stool reported - had BM on 10/05/23, 5/14 and 5/15 -continue miralax  daily   Family Communication:   no Family at bedside   Consultants:  renal, cardiology   Code Status:  FULL    DVT Prophylaxis: apixaban      Procedures: As Listed in Progress Note Above   Antibiotics: Merrem  5/8>>5/11, 5/14          Subjective: Pt seen after return from HD.  She is feeling better today.  Denies f/c, cp, sob, n/v/d, abd pain  Objective: Vitals:   10/08/23 1530 10/08/23 1620 10/08/23 1648 10/08/23 1820  BP: 96/67 (!) 111/52  (!) 121/48  Pulse: 80 80  88  Resp: 20 20  18   Temp:  98 F (36.7 C)  98.1 F (36.7 C)  TempSrc:  Oral  Oral  SpO2:  100%  94%  Weight:    76.5 kg   Height:        Intake/Output Summary (Last 24 hours) at 10/08/2023 1827 Last data filed at 10/08/2023 1620 Gross per 24 hour  Intake --  Output 2500 ml  Net -2500 ml   Weight change:  Exam:  General:  Pt is alert, follows commands appropriately, not in acute distress HEENT: No icterus, No thrush, No neck mass, [place]/AT Cardiovascular: RRR, S1/S2, no rubs, no gallops Respiratory: fine basilar rales. No wheeze Abdomen: Soft/+BS, non tender, non distended, no guarding Extremities: trace LE edema, No lymphangitis, No petechiae, No rashes, no synovitis + R-BKA   Data Reviewed: I have personally reviewed following labs and imaging studies Basic Metabolic Panel: Recent Labs  Lab 10/03/23 0418 10/04/23 0417 10/05/23 0438 10/06/23 0413 10/07/23 0432 10/08/23 0418  NA 128* 131* 130* 130* 130* 132*  K 4.0 3.7 4.1 4.1 4.5 4.0  CL 89* 95* 94* 93* 96* 95*  CO2 21* 26 26 24 23 25   GLUCOSE 224* 295* 94 61* 404* 168*  BUN 55* 27* 42* 49* 29* 40*  CREATININE 6.35* 3.71* 4.74* 5.76* 3.88* 5.25*  CALCIUM  8.2* 8.4* 8.8* 9.0 8.1* 8.8*  MG 2.4  --   --   --   --   --   PHOS 5.3* 3.1 3.6 4.2 4.0 4.2   Liver Function Tests: Recent Labs  Lab 10/04/23 0417 10/05/23 0438 10/06/23 0413 10/07/23 0432 10/08/23 0418  ALBUMIN  2.6* 2.7* 2.4* 2.4* 2.3*   No results for input(s): "LIPASE", "AMYLASE" in the last 168 hours. No results for input(s): "AMMONIA" in the last 168 hours. Coagulation Profile: No results for input(s): "INR", "PROTIME" in the last 168 hours. CBC: Recent Labs  Lab 10/03/23 0418 10/04/23 0417 10/05/23 0438 10/06/23 0413 10/07/23 0432  WBC 5.8 5.4 6.1 5.4 5.1  HGB 10.5* 9.8* 11.0* 10.1* 9.8*  HCT 33.9* 33.2* 36.3 33.5* 33.0*  MCV 90.2 90.2 90.8 89.3 90.9  PLT 313 274 230 205 191   Cardiac Enzymes: No results for input(s): "CKTOTAL", "CKMB", "CKMBINDEX", "TROPONINI" in the last 168 hours. BNP: Invalid input(s): "POCBNP" CBG: Recent Labs  Lab  10/07/23 2156 10/08/23 0312 10/08/23 0736 10/08/23 1024 10/08/23 1751  GLUCAP 185* 169* 218* 262* 130*   HbA1C: No results for input(s): "HGBA1C" in the last 72 hours. Urine analysis:    Component Value Date/Time   COLORURINE AMBER (A) 10/01/2023 1505   APPEARANCEUR CLOUDY (A) 10/01/2023 1505   LABSPEC 1.011 10/01/2023 1505   PHURINE 5.0 10/01/2023 1505   GLUCOSEU NEGATIVE 10/01/2023 1505   HGBUR MODERATE (A) 10/01/2023 1505   BILIRUBINUR NEGATIVE 10/01/2023 1505   BILIRUBINUR negative 06/05/2020 1334   KETONESUR NEGATIVE 10/01/2023 1505   PROTEINUR 100 (A) 10/01/2023 1505   UROBILINOGEN 0.2 06/05/2020 1334   UROBILINOGEN 0.2 04/01/2014 1659   NITRITE NEGATIVE 10/01/2023 1505   LEUKOCYTESUR LARGE (A) 10/01/2023 1505   Sepsis Labs: @LABRCNTIP (procalcitonin:4,lacticidven:4) ) Recent Results (from the past 240 hours)  Culture, blood (Routine X 2) w Reflex to ID Panel     Status: None   Collection  Time: 10/01/23  2:54 PM   Specimen: BLOOD  Result Value Ref Range Status   Specimen Description BLOOD BLOOD LEFT ARM  Final   Special Requests   Final    BOTTLES DRAWN AEROBIC ONLY Blood Culture results may not be optimal due to an inadequate volume of blood received in culture bottles   Culture   Final    NO GROWTH 5 DAYS Performed at [facility], [address], [place], [place] [address]    Report Status 10/06/2023 FINAL  Final  Culture, blood (Routine X 2) w Reflex to ID Panel     Status: None   Collection Time: 10/01/23  2:54 PM   Specimen: BLOOD  Result Value Ref Range Status   Specimen Description BLOOD BLOOD RIGHT ARM  Final   Special Requests   Final    BOTTLES DRAWN AEROBIC ONLY Blood Culture results may not be optimal due to an inadequate volume of blood received in culture bottles   Culture   Final    NO GROWTH 5 DAYS Performed at [facility], [address] [place], [place] [address]    Report Status 10/06/2023 FINAL  Final  MRSA Next Gen by PCR, Nasal      Status: None   Collection Time: 10/02/23  3:52 AM   Specimen: Nasal Mucosa; Nasal Swab  Result Value Ref Range Status   MRSA by PCR Next Gen NOT DETECTED NOT DETECTED Final    Comment: (NOTE) The GeneXpert MRSA Assay (FDA approved for NASAL specimens only), is one component of a comprehensive MRSA colonization surveillance program. It is not intended to diagnose MRSA infection nor to guide or monitor treatment for MRSA infections. Test performance is not FDA approved in patients less than 70 years old. Performed at [facility], [address] [place], [place] [address]      Scheduled Meds:  amiodarone   200 mg Oral Daily   apixaban   5 mg Oral BID   atorvastatin   80 mg Oral Daily   bisacodyl   10 mg Rectal Once   Chlorhexidine  Gluconate Cloth  6 each Topical Q0600   clonazePAM   0.5 mg Oral BID   doxercalciferol  4 mcg Intravenous Q T,Th,Sa-HD   folic acid   1 mg Oral Daily   insulin  aspart  0-6 Units Subcutaneous TID WC   insulin  aspart  4 Units Subcutaneous TID WC   insulin  glargine-yfgn  18 Units Subcutaneous Daily   levothyroxine   50 mcg Oral QAC breakfast   liver oil-zinc  oxide   Topical BID   melatonin  3 mg Oral QHS   pantoprazole   40 mg Oral BID   polyethylene glycol  17 g Oral Daily   sevelamer  carbonate  1,600 mg Oral TID WC   venlafaxine  XR  75 mg Oral Q breakfast   Continuous Infusions:  Procedures/Studies: DG Abd Portable 2V Result Date: 10/05/2023 CLINICAL DATA:  Constipation.  Nausea.  Abdominal distention. EXAM: PORTABLE ABDOMEN - 2 VIEW COMPARISON:  12/08/2022 FINDINGS: Paucity of intestinal gas with no dilated bowel loops seen. Normal amount of stool. No free peritoneal air. Double-lumen right sided double-lumen catheter tip at the superior cavoatrial junction. Coronary artery stents. Tortuous and partially calcified thoracic aorta. Lumbar and thoracic spine degenerative changes. IMPRESSION: Nonobstructive bowel gas pattern with a normal amount of stool.  Electronically Signed   By: Catherin Closs M.D.   On: 10/05/2023 14:25   CT ABDOMEN PELVIS W CONTRAST Result Date: 10/01/2023 CLINICAL DATA:  Altered mental status.  Weakness. EXAM:  CT ABDOMEN AND PELVIS WITH CONTRAST TECHNIQUE: Multidetector CT imaging of the abdomen and pelvis was performed using the standard protocol following bolus administration of intravenous contrast. RADIATION DOSE REDUCTION: This exam was performed according to the departmental dose-optimization program which includes automated exposure control, adjustment of the mA and/or kV according to patient size and/or use of iterative reconstruction technique. CONTRAST:  OMNIPAQUE  IOHEXOL  300 MG/ML  SOLN COMPARISON:  August 09, 2023. FINDINGS: Lower chest: Small right pleural effusion is noted. Minimal bilateral posterior basilar subsegmental atelectasis is noted. Hepatobiliary: Cholelithiasis is noted. No biliary dilatation is noted. Multiple small calcified granulomas are noted in the hepatic parenchyma. Pancreas: Unremarkable. No pancreatic ductal dilatation or surrounding inflammatory changes. Spleen: Multiple small calcified granulomas are noted. Adrenals/Urinary Tract: Adrenal glands appear normal. Bilateral renal cortical scarring is noted. Right renal cyst is noted. No hydronephrosis or renal obstruction is noted. Urinary bladder is decompressed. Stomach/Bowel: Stomach is unremarkable. There is no evidence of bowel obstruction. The appendix appears normal. Sigmoid diverticulosis is noted without inflammation. Moderate amount of stool seen in the rectum concerning for impaction. Vascular/Lymphatic: Aortic atherosclerosis. No enlarged abdominal or pelvic lymph nodes. Reproductive: Status post hysterectomy. No adnexal masses. Other: No ascites or hernia. Musculoskeletal: No acute or significant osseous findings. IMPRESSION: Small right pleural effusion. Cholelithiasis. Bilateral renal cortical scarring. Sigmoid diverticulosis without  inflammation. Moderate amount of stool seen in rectum concerning for impaction. Aortic Atherosclerosis (ICD10-I70.0). Electronically Signed   By: Rosalene Colon M.D.   On: 10/01/2023 16:07   CT HEAD WO CONTRAST ( ) Result Date: 10/01/2023 CLINICAL DATA:  Delirium. EXAM: CT HEAD WITHOUT CONTRAST TECHNIQUE: Contiguous axial images were obtained from the base of the skull through the vertex without intravenous contrast. RADIATION DOSE REDUCTION: This exam was performed according to the departmental dose-optimization program which includes automated exposure control, adjustment of the mA and/or kV according to patient size and/or use of iterative reconstruction technique. COMPARISON:  Head CT 01/15/2022 FINDINGS: Brain: There is no evidence of an acute infarct, intracranial hemorrhage, mass, midline shift, or extra-axial fluid collection. There is mild cerebral atrophy. Patchy cerebral white matter hypodensities have likely mildly progressed and are nonspecific but compatible with moderate chronic small vessel ischemic disease. 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 Vascular: Calcified atherosclerosis at the skull base. No hyperdense vessel. Skull: No fracture or suspicious lesion. Sinuses/Orbits: Visualized paranasal sinuses and mastoid air cells are clear. Bilateral cataract extraction. Other: None. IMPRESSION: 1. No evidence of acute intracranial abnormality. 2. Moderate chronic small vessel ischemic disease. Electronically Signed   By: Aundra [name] M.D.   On: 10/01/2023 15:59   DG Chest Port 1 View Result Date: 10/01/2023 CLINICAL DATA:  Hypoxia. EXAM: PORTABLE CHEST 1 VIEW COMPARISON:  08/13/2023. FINDINGS: Stable cardiomegaly. Right IJ dialysis catheter tip overlies the right atrium, unchanged. Aortic atherosclerosis. Central pulmonary vascular congestion with bilateral interstitial prominence, most pronounced at the lung bases, which could reflect interstitial pulmonary edema. Possible  small left pleural effusion. No pneumothorax. No acute osseous abnormality. IMPRESSION: 1. Cardiomegaly with central pulmonary vascular congestion and findings suggestive of interstitial pulmonary edema. 2. Possible small left pleural effusion. Electronically Signed   By: Mannie Seek M.D.   On: 10/01/2023 13:59    Demaris Fillers, DO  [facility]  If 7PM-7AM, please contact night-coverage [email or web address] Password [facility] 10/08/2023, 6:27 PM   LOS: 7 days

## 2023-10-08 NOTE — Progress Notes (Signed)
 Patient still not back in room from offsite dialysis.          Denese Finn, RCS

## 2023-10-08 NOTE — Evaluation (Signed)
 Occupational Therapy Evaluation Patient Details Name: NICKOL ROSEMAN MRN: [id number] DOB: 19-May-1951 Today's Date: 10/08/2023   History of Present Illness   Ladina Walmsley  is a 73 y.o. female, with PMH significant for ESRD-HD-TTS, PAF on Eliquis , CAD status post PCI, chronic systolic CHF with EF 30-35%, DM2, HTN, HLD, hypothyroidism, anemia of CKD, right BKA, and depression/anxiety with recent hospitalization on 3/12, ESBL UTI and bacteremia.  The right subclavian HD catheter exchanged during that admission  - [name] was sent from HD center for evaluation, patient reports she has not been feeling well, having nausea, occasional vomiting, poor appetite, constipation, as well she does endorse dysuria, was found to be very sick at dialysis center, so was sent to ED for evaluation..     Clinical Impressions Pt agreeable to OT and PT co-evaluation. Pt assisted much at baseline. Able to ambulate with use of RW and prosthetic at baseline. Only able to take a couple steps today before needing to sit. R UE limited at baseline. L UE generally weak. Pt reports getting outpatient therapy at baseline. Pt left in the chair with call bell within reach and PT present and continuing to work with the pt. Pt will benefit from continued OT in the hospital and recommended venue below to increase strength, balance, and endurance for safe ADL's.        If plan is discharge home, recommend the following:   A lot of help with walking and/or transfers;A lot of help with bathing/dressing/bathroom;Assistance with cooking/housework;Assist for transportation;Help with stairs or ramp for entrance     Functional Status Assessment   Patient has had a recent decline in their functional status and/or demonstrates limited ability to make significant improvements in function in a reasonable and predictable amount of time     Equipment Recommendations   None recommended by OT             Precautions/Restrictions    Precautions Precautions: Fall Recall of Precautions/Restrictions: Intact Restrictions Weight Bearing Restrictions Per Provider Order: No     Mobility Bed Mobility Overal bed mobility: Needs Assistance Bed Mobility: Supine to Sit     Supine to sit: HOB elevated, Supervision     General bed mobility comments: mild labored effort    Transfers Overall transfer level: Needs assistance Equipment used: Rolling walker (2 wheels) Transfers: Sit to/from Stand, Bed to chair/wheelchair/BSC Sit to Stand: Min assist     Step pivot transfers: Min assist, Mod assist     General transfer comment: fatigued quickly; labored movement      Balance Overall balance assessment: Needs assistance Sitting-balance support: Feet supported, No upper extremity supported Sitting balance-Leahy Scale: Fair Sitting balance - Comments: fair/good seated at EOB   Standing balance support: Reliant on assistive device for balance, During functional activity, Bilateral upper extremity supported Standing balance-Leahy Scale: Poor Standing balance comment: using RW                           ADL either performed or assessed with clinical judgement   ADL Overall ADL's : Needs assistance/impaired Eating/Feeding: Set up;Sitting   Grooming: Set up;Sitting   Upper Body Bathing: Set up;Sitting   Lower Body Bathing: Maximal assistance;Bed level   Upper Body Dressing : Set up;Sitting   Lower Body Dressing: Maximal assistance;Bed level Lower Body Dressing Details (indicate cue type and reason): Assist to don R LE prosthetic in bed as pt is assisted at baseline. Toilet Transfer: Minimal assistance;Moderate assistance;Rolling  walker (2 wheels) (prosthetic) Toilet Transfer Details (indicate cue type and reason): Simulated via EOB to chair Toileting- Clothing Manipulation and Hygiene: Maximal assistance;Total assistance       Functional mobility during ADLs: Minimal assistance;Moderate  assistance;Rollator (4 wheels) General ADL Comments: Able to take only a couple steps before needing to sit today.     Vision Baseline Vision/History:  (has blurry vision at baseline; gets shots in eyes) Ability to See in Adequate Light: 1 Impaired Patient Visual Report: No change from baseline Vision Assessment?:  (baseline deficits)     Perception Perception: Not tested       Praxis Praxis: Not tested       Pertinent Vitals/Pain Pain Assessment Pain Assessment: Faces Faces Pain Scale: Hurts a little bit Pain Location: stomach Pain Descriptors / Indicators: Cramping Pain Intervention(s): Monitored during session, Limited activity within patient's tolerance, Repositioned     Extremity/Trunk Assessment Upper Extremity Assessment Upper Extremity Assessment: RUE deficits/detail;LUE deficits/detail;Right hand dominant RUE Deficits / Details: 2-/5 shoulder flexion; generally weak otherwise; prior shoulder disclocation injury. LUE Deficits / Details: 3-/5 shoulder flexion ; generally weak otherwise.   Lower Extremity Assessment Lower Extremity Assessment: Defer to PT evaluation   Cervical / Trunk Assessment Cervical / Trunk Assessment: Normal   Communication Communication Communication: No apparent difficulties   Cognition Arousal: Alert Behavior During Therapy: WFL for tasks assessed/performed Cognition: No apparent impairments                               Following commands: Intact       Cueing  General Comments   Cueing Techniques: Verbal cues                 Home Living Family/patient expects to be discharged to:: Private residence Living Arrangements: Spouse/significant other Available Help at Discharge: Family;Available 24 hours/day Type of Home: Mobile home Home Access: Ramped entrance     Home Layout: One level     Bathroom Shower/Tub: Tub/shower unit;Door   [employer] Toilet: Handicapped height Bathroom Accessibility: Yes How  Accessible: Accessible via walker Home Equipment: Grab bars - tub/shower;Rolling Walker (2 wheels);Rollator (4 wheels);BSC/3in1;Cane - quad;Shower seat;Wheelchair - manual;Hospital bed   Additional Comments: per chart      Prior Functioning/Environment Prior Level of Function : Needs assist       Physical Assist : ADLs (physical);Mobility (physical)   ADLs (physical): Bathing;Toileting;Dressing;IADLs Mobility Comments: Short household distances using RW and RLE BKA prosthetec leg, uses wheelchair mostly ADLs Comments: Requires assist for ADLs bed level, pt reports self feeding, grooming and managing UB ADLs. Using brief to have BM and husband A with cleaning, rolling in bed to assist with spouse donning LB clothing. (per chart)    OT Problem List: Decreased strength;Decreased range of motion;Decreased activity tolerance;Impaired balance (sitting and/or standing)   OT Treatment/Interventions: Self-care/ADL training;Therapeutic exercise;Therapeutic activities;Patient/family education;Balance training      OT Goals(Current goals can be found in the care plan section)   Acute Rehab OT Goals Patient Stated Goal: return home OT Goal Formulation: With patient Time For Goal Achievement: 10/22/23 Potential to Achieve Goals: Good   OT Frequency:  Min 1X/week    Co-evaluation PT/OT/SLP Co-Evaluation/Treatment: Yes Reason for Co-Treatment: To address functional/ADL transfers   OT goals addressed during session: ADL's and self-care                       End of Session Equipment Utilized  During Treatment: Rolling walker (2 wheels);Gait belt  Activity Tolerance: Patient tolerated treatment well Patient left: in chair;with call bell/phone within reach  OT Visit Diagnosis: Unsteadiness on feet (R26.81);Muscle weakness (generalized) (M62.81)                Time: 1610-9604 OT Time Calculation (min): 12 min Charges:  OT General Charges $OT Visit: 1 Visit OT Evaluation $OT  Eval Low Complexity: 1 Low  Lauriel Helin OT, MOT  [name] 10/08/2023, 10:03 AM

## 2023-10-08 NOTE — Plan of Care (Signed)
  Problem: Clinical Measurements: Goal: Ability to maintain clinical measurements within normal limits will improve Outcome: Progressing Goal: Will remain free from infection Outcome: Progressing Goal: Diagnostic test results will improve Outcome: Progressing   Problem: Coping: Goal: Level of anxiety will decrease Outcome: Progressing   Problem: Elimination: Goal: Will not experience complications related to urinary retention Outcome: Progressing   Problem: Pain Managment: Goal: General experience of comfort will improve and/or be controlled Outcome: Progressing   Problem: Safety: Goal: Ability to remain free from injury will improve Outcome: Progressing   Problem: Coping: Goal: Ability to adjust to condition or change in health will improve Outcome: Progressing   Problem: Health Behavior/Discharge Planning: Goal: Ability to manage health-related needs will improve Outcome: Progressing   Problem: Metabolic: Goal: Ability to maintain appropriate glucose levels will improve Outcome: Progressing   Problem: Skin Integrity: Goal: Risk for impaired skin integrity will decrease Outcome: Progressing   Problem: Tissue Perfusion: Goal: Adequacy of tissue perfusion will improve Outcome: Progressing

## 2023-10-08 NOTE — Progress Notes (Signed)
 PROGRESS NOTE  Susan [name] EAV:[id number] DOB: 1950/12/20 DOA: 10/01/2023 PCP: Austine Lefort, MD  Brief History:  73 y.o. female, with PMH significant for ESRD-HD-TTS, PAF on Eliquis , CAD status post PCI, chronic systolic CHF with EF 30-35%, DM2, HTN, HLD, hypothyroidism, anemia of CKD, right BKA, and depression/anxiety with recent hospitalization on 3/12, ESBL UTI and bacteremia.  The right subclavian HD catheter exchanged during that admission.   -Pt was sent from HD center for evaluation, patient reports she has not been feeling well, having nausea, occasional vomiting, poor appetite, constipation, as well she does endorse dysuria, was found to be very sick at dialysis center, so was sent to ED for evaluation. - In ED, her UA was positive, she does endorse dysuria, was positive, no leukocytosis, afebrile, sodium was low at 128, potassium at 3.3, CT abdomen pelvis with diverticulosis, and constipation, treated hospitalist consulted to admit.   Assessment/Plan: UTI  - Patient with known history of ESBL UTI and bacteremia in the past, she presents with dysuria, had leukocytosis - UA is positive - Treated with meropenem  x 5 days - DC was held due to issues with constipation, vomiting and hypoglycemia which we are addressing     ESRD-HD-TTS -She missed her HD as she was sent from dialysis center due to her illness. - Location for emergency dialysis overnight, renal consult requested in epic to dialyze tomorrow. - Patient right subclavian hemodialysis catheter was changed out by IR 3/17 because of bacteremia during previous admission - received HD 5/15   Intractable vomiting -increase pantoprazole  to bid -continue compazine  around the clock -improving slowly   Chronic hyponatremia -At baseline, management with dialysis -overall stable in low 130s   Hypokalemia -management with dialysis   Chronic hypotension Chronic systolic CHF -Continue with home midodrine  before  dialysis days -HD treatment today for volume removal -Most recent echo from April 2024 with EF 30 to 35%, moderate to severe LV dysfunction -previously on coreg  -appreciate cardiology consult -repeat Echo   PAF  Heart rate controlled with amiodarone  Chronically anticoagulated with apixaban  -currently in sinus   Chronic respiratory failure with hypoxia -on 2L at night at home -stable    CAD, HLD -Apixaban  and statin -no angina presently   Type 2 diabetes mellitus uncontrolled with hypoglycemia - 10/01/23 A1c 6.8%  - did not receive lantus  5/13 - restart semglee  18 units on 5/14 - no further hypoglycemia   Hypoglycemia from insulin  - pt had a severe hypoglycemia episode on 5/12 where BS dropped to 42, I treated at bedside - we have greatly reduced her insulin  doses as she is no longer having glucotoxicity - continue frequent CBG monitoring as ordered 5x per day   Anemia of CKD Hemoglobin at baseline, management per renal   Hypothyroidism Continue Synthroid    Depression/anxiety Continue Klonopin , melatonin nightly, Effexor  daily   Constipation concern for fecal impaction -started on bowel regimen -5/9 molasses enema ordered -check portable abd xrays on 5/12- normal amount of stool reported - had BM on 10/05/23, 5/14 and 5/15 -continue miralax  daily  Family Communication:   no Family at bedside   Consultants:  renal, cardiology   Code Status:  FULL    DVT Prophylaxis: apixaban      Procedures: As Listed in Progress Note Above   Antibiotics: Merrem  5/8>>5/11, 5/14         Subjective: Patient left for HD at [place] prior to being seen today  Objective: Vitals:  10/08/23 1230 10/08/23 1300 10/08/23 1330 10/08/23 1400  BP: 116/60 (!) 114/41 (!) 113/45 105/64  Pulse: 81 82 82 83  Resp: 20 20 20 20   Temp:      TempSrc:      SpO2:      Weight:      Height:        Intake/Output Summary (Last 24 hours) at 10/08/2023 1503 Last data filed at  10/07/2023 1700 Gross per 24 hour  Intake 400 ml  Output --  Net 400 ml   Weight change:  Exam:   Data Reviewed: I have personally reviewed following labs and imaging studies Basic Metabolic Panel: Recent Labs  Lab 10/03/23 0418 10/04/23 0417 10/05/23 0438 10/06/23 0413 10/07/23 0432 10/08/23 0418  NA 128* 131* 130* 130* 130* 132*  K 4.0 3.7 4.1 4.1 4.5 4.0  CL 89* 95* 94* 93* 96* 95*  CO2 21* 26 26 24 23 25   GLUCOSE 224* 295* 94 61* 404* 168*  BUN 55* 27* 42* 49* 29* 40*  CREATININE 6.35* 3.71* 4.74* 5.76* 3.88* 5.25*  CALCIUM  8.2* 8.4* 8.8* 9.0 8.1* 8.8*  MG 2.4  --   --   --   --   --   PHOS 5.3* 3.1 3.6 4.2 4.0 4.2   Liver Function Tests: Recent Labs  Lab 10/04/23 0417 10/05/23 0438 10/06/23 0413 10/07/23 0432 10/08/23 0418  ALBUMIN  2.6* 2.7* 2.4* 2.4* 2.3*   No results for input(s): "LIPASE", "AMYLASE" in the last 168 hours. No results for input(s): "AMMONIA" in the last 168 hours. Coagulation Profile: No results for input(s): "INR", "PROTIME" in the last 168 hours. CBC: Recent Labs  Lab 10/03/23 0418 10/04/23 0417 10/05/23 0438 10/06/23 0413 10/07/23 0432  WBC 5.8 5.4 6.1 5.4 5.1  HGB 10.5* 9.8* 11.0* 10.1* 9.8*  HCT 33.9* 33.2* 36.3 33.5* 33.0*  MCV 90.2 90.2 90.8 89.3 90.9  PLT 313 274 230 205 191   Cardiac Enzymes: No results for input(s): "CKTOTAL", "CKMB", "CKMBINDEX", "TROPONINI" in the last 168 hours. BNP: Invalid input(s): "POCBNP" CBG: Recent Labs  Lab 10/07/23 2031 10/07/23 2156 10/08/23 0312 10/08/23 0736 10/08/23 1024  GLUCAP 207* 185* 169* 218* 262*   HbA1C: No results for input(s): "HGBA1C" in the last 72 hours. Urine analysis:    Component Value Date/Time   COLORURINE AMBER (A) 10/01/2023 1505   APPEARANCEUR CLOUDY (A) 10/01/2023 1505   LABSPEC 1.011 10/01/2023 1505   PHURINE 5.0 10/01/2023 1505   GLUCOSEU NEGATIVE 10/01/2023 1505   HGBUR MODERATE (A) 10/01/2023 1505   BILIRUBINUR NEGATIVE 10/01/2023 1505    BILIRUBINUR negative 06/05/2020 1334   KETONESUR NEGATIVE 10/01/2023 1505   PROTEINUR 100 (A) 10/01/2023 1505   UROBILINOGEN 0.2 06/05/2020 1334   UROBILINOGEN 0.2 04/01/2014 1659   NITRITE NEGATIVE 10/01/2023 1505   LEUKOCYTESUR LARGE (A) 10/01/2023 1505   Sepsis Labs: @LABRCNTIP (procalcitonin:4,lacticidven:4) ) Recent Results (from the past 240 hours)  Culture, blood (Routine X 2) w Reflex to ID Panel     Status: None   Collection Time: 10/01/23  2:54 PM   Specimen: BLOOD  Result Value Ref Range Status   Specimen Description BLOOD BLOOD LEFT ARM  Final   Special Requests   Final    BOTTLES DRAWN AEROBIC ONLY Blood Culture results may not be optimal due to an inadequate volume of blood received in culture bottles   Culture   Final    NO GROWTH 5 DAYS Performed at [facility], [address] [place], [place] [address]  Report Status 10/06/2023 FINAL  Final  Culture, blood (Routine X 2) w Reflex to ID Panel     Status: None   Collection Time: 10/01/23  2:54 PM   Specimen: BLOOD  Result Value Ref Range Status   Specimen Description BLOOD BLOOD RIGHT ARM  Final   Special Requests   Final    BOTTLES DRAWN AEROBIC ONLY Blood Culture results may not be optimal due to an inadequate volume of blood received in culture bottles   Culture   Final    NO GROWTH 5 DAYS Performed at [facility], [address] [place], [place] [address]    Report Status 10/06/2023 FINAL  Final  MRSA Next Gen by PCR, Nasal     Status: None   Collection Time: 10/02/23  3:52 AM   Specimen: Nasal Mucosa; Nasal Swab  Result Value Ref Range Status   MRSA by PCR Next Gen NOT DETECTED NOT DETECTED Final    Comment: (NOTE) The GeneXpert MRSA Assay (FDA approved for NASAL specimens only), is one component of a comprehensive MRSA colonization surveillance program. It is not intended to diagnose MRSA infection nor to guide or monitor treatment for MRSA infections. Test performance is not FDA approved in  patients less than 10 years old. Performed at [facility], [address], [place], [place] [address]      Scheduled Meds:  amiodarone   200 mg Oral Daily   apixaban   5 mg Oral BID   atorvastatin   80 mg Oral Daily   bisacodyl   10 mg Rectal Once   Chlorhexidine  Gluconate Cloth  6 each Topical Q0600   clonazePAM   0.5 mg Oral BID   doxercalciferol  4 mcg Intravenous Q T,Th,Sa-HD   folic acid   1 mg Oral Daily   insulin  aspart  0-6 Units Subcutaneous TID WC   insulin  aspart  4 Units Subcutaneous TID WC   insulin  glargine-yfgn  18 Units Subcutaneous Daily   levothyroxine   50 mcg Oral QAC breakfast   liver oil-zinc  oxide   Topical BID   melatonin  3 mg Oral QHS   pantoprazole   40 mg Oral BID   polyethylene glycol  17 g Oral Daily   sevelamer  carbonate  1,600 mg Oral TID WC   venlafaxine  XR  75 mg Oral Q breakfast   Continuous Infusions:  Procedures/Studies: DG Abd Portable 2V Result Date: 10/05/2023 CLINICAL DATA:  Constipation.  Nausea.  Abdominal distention. EXAM: PORTABLE ABDOMEN - 2 VIEW COMPARISON:  12/08/2022 FINDINGS: Paucity of intestinal gas with no dilated bowel loops seen. Normal amount of stool. No free peritoneal air. Double-lumen right sided double-lumen catheter tip at the superior cavoatrial junction. Coronary artery stents. Tortuous and partially calcified thoracic aorta. Lumbar and thoracic spine degenerative changes. IMPRESSION: Nonobstructive bowel gas pattern with a normal amount of stool. Electronically Signed   By: Catherin Closs M.D.   On: 10/05/2023 14:25   CT ABDOMEN PELVIS W CONTRAST Result Date: 10/01/2023 CLINICAL DATA:  Altered mental status.  Weakness. EXAM: CT ABDOMEN AND PELVIS WITH CONTRAST TECHNIQUE: Multidetector CT imaging of the abdomen and pelvis was performed using the standard protocol following bolus administration of intravenous contrast. RADIATION DOSE REDUCTION: This exam was performed according to the departmental dose-optimization program which  includes automated exposure control, adjustment of the mA and/or kV according to patient size and/or use of iterative reconstruction technique. CONTRAST:  OMNIPAQUE  IOHEXOL  300 MG/ML  SOLN COMPARISON:  August 09, 2023. FINDINGS: Lower chest: Small right pleural effusion is noted.  Minimal bilateral posterior basilar subsegmental atelectasis is noted. Hepatobiliary: Cholelithiasis is noted. No biliary dilatation is noted. Multiple small calcified granulomas are noted in the hepatic parenchyma. Pancreas: Unremarkable. No pancreatic ductal dilatation or surrounding inflammatory changes. Spleen: Multiple small calcified granulomas are noted. Adrenals/Urinary Tract: Adrenal glands appear normal. Bilateral renal cortical scarring is noted. Right renal cyst is noted. No hydronephrosis or renal obstruction is noted. Urinary bladder is decompressed. Stomach/Bowel: Stomach is unremarkable. There is no evidence of bowel obstruction. The appendix appears normal. Sigmoid diverticulosis is noted without inflammation. Moderate amount of stool seen in the rectum concerning for impaction. Vascular/Lymphatic: Aortic atherosclerosis. No enlarged abdominal or pelvic lymph nodes. Reproductive: Status post hysterectomy. No adnexal masses. Other: No ascites or hernia. Musculoskeletal: No acute or significant osseous findings. IMPRESSION: Small right pleural effusion. Cholelithiasis. Bilateral renal cortical scarring. Sigmoid diverticulosis without inflammation. Moderate amount of stool seen in rectum concerning for impaction. Aortic Atherosclerosis (ICD10-I70.0). Electronically Signed   By: Rosalene Colon M.D.   On: 10/01/2023 16:07   CT HEAD WO CONTRAST ( ) Result Date: 10/01/2023 CLINICAL DATA:  Delirium. EXAM: CT HEAD WITHOUT CONTRAST TECHNIQUE: Contiguous axial images were obtained from the base of the skull through the vertex without intravenous contrast. RADIATION DOSE REDUCTION: This exam was performed according to the  departmental dose-optimization program which includes automated exposure control, adjustment of the mA and/or kV according to patient size and/or use of iterative reconstruction technique. COMPARISON:  Head CT 01/15/2022 FINDINGS: Brain: There is no evidence of an acute infarct, intracranial hemorrhage, mass, midline shift, or extra-axial fluid collection. There is mild cerebral atrophy. Patchy cerebral white matter hypodensities have likely mildly progressed and are nonspecific but compatible with moderate chronic small vessel ischemic disease. 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 Vascular: Calcified atherosclerosis at the skull base. No hyperdense vessel. Skull: No fracture or suspicious lesion. Sinuses/Orbits: Visualized paranasal sinuses and mastoid air cells are clear. Bilateral cataract extraction. Other: None. IMPRESSION: 1. No evidence of acute intracranial abnormality. 2. Moderate chronic small vessel ischemic disease. Electronically Signed   By: Aundra [name] M.D.   On: 10/01/2023 15:59   DG Chest Port 1 View Result Date: 10/01/2023 CLINICAL DATA:  Hypoxia. EXAM: PORTABLE CHEST 1 VIEW COMPARISON:  08/13/2023. FINDINGS: Stable cardiomegaly. Right IJ dialysis catheter tip overlies the right atrium, unchanged. Aortic atherosclerosis. Central pulmonary vascular congestion with bilateral interstitial prominence, most pronounced at the lung bases, which could reflect interstitial pulmonary edema. Possible small left pleural effusion. No pneumothorax. No acute osseous abnormality. IMPRESSION: 1. Cardiomegaly with central pulmonary vascular congestion and findings suggestive of interstitial pulmonary edema. 2. Possible small left pleural effusion. Electronically Signed   By: Mannie Seek M.D.   On: 10/01/2023 13:59    Demaris Fillers, DO  [facility]  If 7PM-7AM, please contact night-coverage [email or web address] Password [email or web address] 10/08/2023, 3:03 PM   LOS: 7 days

## 2023-10-08 NOTE — Inpatient Diabetes Management (Signed)
 Inpatient Diabetes Program Recommendations  AACE/ADA: New Consensus Statement on Inpatient Glycemic Control (2015)  Target Ranges:  Prepandial:   less than 140 mg/dL      Peak postprandial:   less than 180 mg/dL (1-2 hours)      Critically ill patients:  140 - 180 mg/dL    Latest Reference Range & Units 10/07/23 03:08 10/07/23 07:33 10/07/23 11:14 10/07/23 16:25 10/07/23 20:31 10/07/23 21:56  Glucose-Capillary 70 - 99 mg/dL 161 (H) 096 (H)  5 units Novolog   339 (H)  8 units Novolog   18 units Semglee  @1040   237 (H)  6 units Novolog   207 (H) 185 (H)  (H): Data is abnormally high  Latest Reference Range & Units 10/08/23 07:36 10/08/23 10:24  Glucose-Capillary 70 - 99 mg/dL 045 (H)  2 units Novolog   18 units Semglee  262 (H)  (H): Data is abnormally high    Home DM Meds: OmniPod 5 Insulin  Pump  Current Orders: Semglee  18 units daily     Novolog  0-6 units TID     Novolog  4 units TID with meals     Pt did not get Novolog  Meal Coverage this AM with breakfast CBG at 11am was 262  MD- Please consider increasing the Semglee  insulin  to 22 units daily    --Will follow patient during hospitalization--  Langston Pippins RN, MSN, CDCES Diabetes Coordinator Inpatient Glycemic Control Team Team Pager: [phone] (8a-5p)

## 2023-10-08 NOTE — Progress Notes (Signed)
 Pt completed dialysis treatment without issue. Pt UF goal 2.5 L per Dr. Zana Hesselbach ordered.  10/08/23 1620  Vitals  Temp 98 F (36.7 C)  Temp Source Oral  BP (!) 111/52  BP Location Right Wrist  BP Method Automatic  Patient Position (if appropriate) Lying  Pulse Rate 80  Resp 20  Oxygen  Therapy  SpO2 100 %  O2 Device Room Air  During Treatment Monitoring  Intra-Hemodialysis Comments Tx completed  Post Treatment  Dialyzer Clearance Lightly streaked  Hemodialysis Intake (mL) 0 mL  Liters Processed 96  Fluid Removed (mL) 2500 mL  Tolerated HD Treatment Yes  Post-Hemodialysis Comments Pt goal met  Hemodialysis Catheter Right Subclavian Double lumen Permanent (Tunneled)  Placement Date/Time: 08/10/23 1138   Placed prior to admission: Yes  Serial / Lot #: [id number]  Expiration Date: 01/04/28  Time Out: Correct patient;Correct site;Correct procedure  Maximum sterile barrier precautions: Hand hygiene;Cap;Mask;Sterile go...  Site Condition No complications  Blue Lumen Status Heparin  locked  Red Lumen Status Heparin  locked  Catheter fill solution Heparin  1000 units/ml  Catheter fill volume (Arterial) 1.9 cc  Catheter fill volume (Venous) 1.9  Dressing Type Gauze/Drain sponge;Transparent  Dressing Status Antimicrobial disc/dressing in place  Interventions New dressing  Drainage Description None  Dressing Change Due 10/10/23  Post treatment catheter status Capped and Clamped

## 2023-10-08 NOTE — Progress Notes (Signed)
 [facility] Progress Note    Assessment/ Plan:   # AMS/UTI: s/p meropenem , bcx ngtd-per primary.  We discussed preventive measures today- peri-care BID, wiping front to back with a fresh surface every time.  Avoid perfumes and sprays which can irritate perineal/ urethral skin.     # ESRD: on HD TTS, continue dialysis per schedule. Next HD today.   # Hypertension/volume: getting under EDW, recommend readjusting EDW to post treatment weight from 5/10: around 74.8kg   # Anemia of ESRD: Just received erythropoietin as outpatient, monitor hemoglobin-stable   # Secondary hyperparathyroidism, metabolic Bone Disease: Continue calcitriol  and sevelamer .  Po4 acceptable  OP HD orders: Dialyzes at [facility], TTS, 4 hr, EDW 76.2 kg. HD 2k2ca [place] , 400/800 Heparin  .  Access RIJ TDC. Mircera 100 mcg on 5/3 Calcitriol  2.5 mcg three times per week  Subjective:    For HD today, still needing transport to [facility].    Objective:   BP (!) 103/38 (BP Location: Right Wrist)   Pulse 68   Temp 97.7 F (36.5 C) (Oral)   Resp 16   Ht 5\' 5"  (1.651 m)   Wt 76.2 kg   SpO2 99%   BMI 27.96 kg/m   Intake/Output Summary (Last 24 hours) at 10/08/2023 0943 Last data filed at 10/07/2023 1700 Gross per 24 hour  Intake 700 ml  Output --  Net 700 ml   Weight change:   Physical Exam: Gen: NAD, lying in bed CVS: Normal rate, no rub Resp: Bilateral chest rise with no increased work of breathing Abd: soft, nondistended Ext: rt bka, trace edema of the bilateral lower extremities Neuro: awake, alert Dialysis access: RIJ TDC c/d/i  Imaging: No results found.   Labs: BMET Recent Labs  Lab 10/02/23 0356 10/03/23 0418 10/04/23 0417 10/05/23 0438 10/06/23 0413 10/07/23 0432 10/08/23 0418  NA 132* 128* 131* 130* 130* 130* 132*  K 3.7 4.0 3.7 4.1 4.1 4.5 4.0  CL 90* 89* 95* 94* 93* 96* 95*  CO2 19* 21* 26 26 24 23 25   GLUCOSE 220* 224* 295* 94 61* 404* 168*  BUN 44* 55* 27*  42* 49* 29* 40*  CREATININE 4.89* 6.35* 3.71* 4.74* 5.76* 3.88* 5.25*  CALCIUM  8.6* 8.2* 8.4* 8.8* 9.0 8.1* 8.8*  PHOS  --  5.3* 3.1 3.6 4.2 4.0 4.2   CBC Recent Labs  Lab 10/04/23 0417 10/05/23 0438 10/06/23 0413 10/07/23 0432  WBC 5.4 6.1 5.4 5.1  HGB 9.8* 11.0* 10.1* 9.8*  HCT 33.2* 36.3 33.5* 33.0*  MCV 90.2 90.8 89.3 90.9  PLT 274 230 205 191    Medications:     amiodarone   200 mg Oral Daily   apixaban   5 mg Oral BID   atorvastatin   80 mg Oral Daily   bisacodyl   10 mg Rectal Once   Chlorhexidine  Gluconate Cloth  6 each Topical Q0600   clonazePAM   0.5 mg Oral BID   doxercalciferol  4 mcg Intravenous Q T,Th,Sa-HD   folic acid   1 mg Oral Daily   insulin  aspart  0-6 Units Subcutaneous TID WC   insulin  aspart  4 Units Subcutaneous TID WC   insulin  glargine-yfgn  18 Units Subcutaneous Daily   levothyroxine   50 mcg Oral QAC breakfast   liver oil-zinc  oxide   Topical BID   melatonin  3 mg Oral QHS   pantoprazole   40 mg Oral BID   polyethylene glycol  17 g Oral Daily   sevelamer  carbonate  1,600 mg Oral  TID WC   venlafaxine  XR  75 mg Oral Q breakfast      [facility] 10/08/2023, 9:43 AM

## 2023-10-08 NOTE — Progress Notes (Signed)
 Patient at [facility] for Dialysis.                Denese Finn, RCS

## 2023-10-08 NOTE — Plan of Care (Signed)
  Problem: Acute Rehab OT Goals (only OT should resolve) Goal: Pt. Will Perform Grooming Flowsheets (Taken 10/08/2023 1005) Pt Will Perform Grooming: with modified independence Goal: Pt. Will Perform Upper Body Dressing Flowsheets (Taken 10/08/2023 1005) Pt Will Perform Upper Body Dressing: with modified independence Goal: Pt. Will Transfer To Toilet Flowsheets (Taken 10/08/2023 1005) Pt Will Transfer to Toilet:  with supervision  with contact guard assist  ambulating Goal: Pt/Caregiver Will Perform Home Exercise Program Flowsheets (Taken 10/08/2023 1005) Pt/caregiver will Perform Home Exercise Program:  Increased ROM  Increased strength  Right Upper extremity  Left upper extremity  Independently  Susan [name] OT, MOT

## 2023-10-08 NOTE — Telephone Encounter (Signed)
 Front office left patient a voice message to schedule a post hosp f/u appt in [facility] in 2 - 3 weeks per nurse Emer C, RN.  Patient did not answer telephone call so front office personnel left a message to call Dr. Andria Keeler office to schedule.

## 2023-10-08 NOTE — Progress Notes (Signed)
 Physical Therapy Treatment Patient Details Name: Susan [name] MRN: [id number] DOB: September 04, 1950 Today's Date: 10/08/2023   History of Present Illness Susan [name]  is a 73 y.o. female, with PMH significant for ESRD-HD-TTS, PAF on Eliquis , CAD status post PCI, chronic systolic CHF with EF 30-35%, DM2, HTN, HLD, hypothyroidism, anemia of CKD, right BKA, and depression/anxiety with recent hospitalization on 3/12, ESBL UTI and bacteremia.  The right subclavian HD catheter exchanged during that admission  - [name] was sent from HD center for evaluation, patient reports she has not been feeling well, having nausea, occasional vomiting, poor appetite, constipation, as well she does endorse dysuria, was found to be very sick at dialysis center, so was sent to ED for evaluation..    PT Comments  Patient agreeable for therapy and states she feels better today. Patient required Max assist for donning LLE BKA prosthetic leg, able to use arms for assisting with supine to sitting and limited to a few steps away from bedside before having to sit due to c/o fatigue/generalized weakness.  Patient demonstrates fair/good return for completing BLE ROM/strengthening exercises while seated in chair and tolerated staying up in chair after therapy. Patient will benefit from continued skilled physical therapy in hospital and recommended venue below to increase strength, balance, endurance for safe ADLs and gait.     If plan is discharge home, recommend the following: A lot of help with bathing/dressing/bathroom;A lot of help with walking and/or transfers;Help with stairs or ramp for entrance;Assistance with cooking/housework   Can travel by private vehicle        Equipment Recommendations  None recommended by PT    Recommendations for Other Services       Precautions / Restrictions Precautions Precautions: Fall Recall of Precautions/Restrictions: Intact Restrictions Weight Bearing Restrictions Per Provider Order: No      Mobility  Bed Mobility Overal bed mobility: Needs Assistance Bed Mobility: Supine to Sit     Supine to sit: HOB elevated, Supervision     General bed mobility comments: increased time, labored movement    Transfers Overall transfer level: Needs assistance Equipment used: Rolling walker (2 wheels) Transfers: Sit to/from Stand, Bed to chair/wheelchair/BSC Sit to Stand: Min assist   Step pivot transfers: Min assist, Mod assist       General transfer comment: unsteady on feet with most diffiuclty completing sit to stands using RW    Ambulation/Gait Ambulation/Gait assistance: Mod assist Gait Distance (Feet): 8 Feet Assistive device: Rolling walker (2 wheels) Gait Pattern/deviations: Decreased step length - right, Decreased step length - left, Decreased stride length Gait velocity: slow     General Gait Details: limited to a few steps forward at bedside before having to sit due to c/o fatigue and BLE weakness   Stairs             Wheelchair Mobility     Tilt Bed    Modified Rankin (Stroke Patients Only)       Balance Overall balance assessment: Needs assistance Sitting-balance support: Feet supported, No upper extremity supported Sitting balance-Leahy Scale: Fair Sitting balance - Comments: fair/good seated at EOB   Standing balance support: Reliant on assistive device for balance, During functional activity, Bilateral upper extremity supported Standing balance-Leahy Scale: Poor Standing balance comment: using RW                            Communication Communication Communication: No apparent difficulties  Cognition Arousal: Alert Behavior During  Therapy: WFL for tasks assessed/performed   PT - Cognitive impairments: No apparent impairments                         Following commands: Intact      Cueing Cueing Techniques: Verbal cues  Exercises General Exercises - Lower Extremity Long Arc Quad: Seated, AROM,  Strengthening, Both, 10 reps Hip Flexion/Marching: Seated, AROM, Strengthening, Both, 10 reps Toe Raises: Seated, AROM, Strengthening, Right, 10 reps Heel Raises: Seated, AROM, Strengthening, Right, 10 reps    General Comments        Pertinent Vitals/Pain Pain Assessment Pain Assessment: Faces Faces Pain Scale: Hurts a little bit Pain Location: stomach Pain Descriptors / Indicators: Cramping Pain Intervention(s): Monitored during session, Limited activity within patient's tolerance, Patient requesting pain meds-RN notified    Home Living                          Prior Function            PT Goals (current goals can now be found in the care plan section) Acute Rehab PT Goals Patient Stated Goal: return home with spouse to assist PT Goal Formulation: With patient Time For Goal Achievement: 10/12/23 Potential to Achieve Goals: Good Progress towards PT goals: Progressing toward goals    Frequency    Min 3X/week      PT Plan      Co-evaluation PT/OT/SLP Co-Evaluation/Treatment: Yes Reason for Co-Treatment: To address functional/ADL transfers PT goals addressed during session: Mobility/safety with mobility;Balance;Proper use of DME;Strengthening/ROM        AM-PAC PT "6 Clicks" Mobility   Outcome Measure  Help needed turning from your back to your side while in a flat bed without using bedrails?: A Little Help needed moving from lying on your back to sitting on the side of a flat bed without using bedrails?: A Little Help needed moving to and from a bed to a chair (including a wheelchair)?: A Lot Help needed standing up from a chair using your arms (e.g., wheelchair or bedside chair)?: A Lot Help needed to walk in hospital room?: A Lot Help needed climbing 3-5 steps with a railing? : A Lot 6 Click Score: 14    End of Session Equipment Utilized During Treatment: Gait belt Activity Tolerance: Patient tolerated treatment well;Patient limited by  fatigue Patient left: in chair;with call bell/phone within reach Nurse Communication: Mobility status PT Visit Diagnosis: Unsteadiness on feet (R26.81);Other abnormalities of gait and mobility (R26.89);Muscle weakness (generalized) (M62.81)     Time: 1610-9604 PT Time Calculation (min) (ACUTE ONLY): 24 min  Charges:    $Therapeutic Exercise: 8-22 mins $Therapeutic Activity: 8-22 mins PT General Charges $$ ACUTE PT VISIT: 1 Visit                     2:16 PM, 10/08/23 Walton Guppy, MPT Physical Therapist with [facility] 336 [phone] office [phone] mobile phone

## 2023-10-08 NOTE — Progress Notes (Signed)
 Awaiting repeat echo. No additional cardiology recs at this time. Chronic HFrEF but fluid management is by HD and managed by neprhology. Medical therapy has been very limited due to chronic hypotension and ESRD, she is on midodrine . Followed by HF clinic as outpatient. F/u echo, no additional recs at this time, can f/u with HF clinic as outpatient   Susan Lander MD

## 2023-10-09 ENCOUNTER — Other Ambulatory Visit (HOSPITAL_COMMUNITY)

## 2023-10-09 LAB — RENAL FUNCTION PANEL
Albumin: 2.5 g/dL — ABNORMAL LOW (ref 3.5–5.0)
Anion gap: 8 (ref 5–15)
BUN: 26 mg/dL — ABNORMAL HIGH (ref 8–23)
CO2: 27 mmol/L (ref 22–32)
Calcium: 8.6 mg/dL — ABNORMAL LOW (ref 8.9–10.3)
Chloride: 97 mmol/L — ABNORMAL LOW (ref 98–111)
Creatinine, Ser: 3.32 mg/dL — ABNORMAL HIGH (ref 0.44–1.00)
GFR, Estimated: 14 mL/min — ABNORMAL LOW (ref >60)
Glucose, Bld: 256 mg/dL — ABNORMAL HIGH (ref 70–99)
Phosphorus: 2.8 mg/dL (ref 2.5–4.6)
Potassium: 3.7 mmol/L (ref 3.5–5.1)
Sodium: 132 mmol/L — ABNORMAL LOW (ref 135–145)

## 2023-10-09 LAB — GLUCOSE, CAPILLARY
Glucose-Capillary: 259 mg/dL — ABNORMAL HIGH (ref 70–99)
Glucose-Capillary: 219 mg/dL — ABNORMAL HIGH (ref 70–99)

## 2023-10-09 MED ORDER — ACETAMINOPHEN 500 MG PO TABS: 1000.0000 mg | ORAL_TABLET | Freq: Three times a day (TID) | ORAL | Status: DC

## 2023-10-09 NOTE — Progress Notes (Signed)
 Discharge information provided to patient and husband. No questions related to the instructions. Patient discharging home with husband; wheelchair used to transport from unit to vehicle.

## 2023-10-09 NOTE — Care Management Important Message (Signed)
 Important Message  Patient Details  Name: Susan [name] MRN: [id number] Date of Birth: Jun 27, 1950   Important Message Given:  Yes - Medicare IM (MAILED COPY TO ADDRESS ON FILE)     Aizen Duval L Tryone Kille 10/09/2023, 11:17 AM

## 2023-10-09 NOTE — Plan of Care (Signed)
  Problem: Clinical Measurements: Goal: Ability to maintain clinical measurements within normal limits will improve Outcome: Progressing   Problem: Pain Managment: Goal: General experience of comfort will improve and/or be controlled Outcome: Progressing   Problem: Skin Integrity: Goal: Risk for impaired skin integrity will decrease Outcome: Progressing

## 2023-10-09 NOTE — Discharge Summary (Signed)
 Physician Discharge Summary   Patient: Susan [name] MRN: [id number] DOB: 1951-04-27  Admit date:     10/01/2023  Discharge date: 10/09/23  Discharge Physician: Myrtie Atkinson Elayne Gruver   PCP: Austine Lefort, MD   Recommendations at discharge:   Please follow up with primary care provider within 1-2 weeks  Please repeat BMP and CBC in one week     Hospital Course: 73 y.o. female, with PMH significant for ESRD-HD-TTS, PAF on Eliquis , CAD status post PCI, chronic systolic CHF with EF 30-35%, DM2, HTN, HLD, hypothyroidism, anemia of CKD, right BKA, and depression/anxiety with recent hospitalization on 3/12, ESBL UTI and bacteremia.  The right subclavian HD catheter exchanged during that admission.   -Pt was sent from HD center for evaluation, patient reports she has not been feeling well, having nausea, occasional vomiting, poor appetite, constipation, as well she does endorse dysuria, was found to be very sick at dialysis center, so was sent to ED for evaluation. - In ED, her UA was positive, she does endorse dysuria, was positive, no leukocytosis, afebrile, sodium was low at 128, potassium at 3.3, CT abdomen pelvis with diverticulosis, and constipation, treated hospitalist consulted to admit.  Assessment and Plan: UTI  - Patient with known history of ESBL UTI and bacteremia in the past, she presents with dysuria, had leukocytosis - UA is positive - Treated with meropenem  x 5 days - DC was held due to issues with constipation, vomiting and hypoglycemia which we are addressing      ESRD-HD-TTS -She missed her HD as she was sent from dialysis center due to her illness. - Location for emergency dialysis overnight, renal consult requested in epic to dialyze tomorrow. - Patient right subclavian hemodialysis catheter was changed out by IR 3/17 because of bacteremia during previous admission - received HD 5/15   Intractable vomiting -increase pantoprazole  to bid -continue compazine  around the  clock -improving slowly -tolerating diet at time of d/c   Chronic hyponatremia -At baseline, management with dialysis -overall stable in low 130s   Hypokalemia -management with dialysis   Chronic hypotension Chronic systolic CHF -Continue with home midodrine  before dialysis days -HD treatment today for volume removal -Most recent echo from April 2024 with EF 30 to 35%, moderate to severe LV dysfunction -previously on coreg  -appreciate cardiology consult--no new changes    PAF  Heart rate controlled with amiodarone  Chronically anticoagulated with apixaban  -currently in sinus   Chronic respiratory failure with hypoxia -on 2L at night at home -stable on RA during day time    CAD, HLD -Apixaban  and statin -no angina presently   Type 2 diabetes mellitus uncontrolled with hypoglycemia - 10/01/23 A1c 6.8%  - did not receive lantus  5/13 - restart semglee  18 units on 5/14 - no further hypoglycemia - pt is on Dexcom 7 with Omnipod insulin  system at home which will be restarted after d/c   Hypoglycemia from insulin  - pt had a severe hypoglycemia episode on 5/12 where BS dropped to 42, I treated at bedside - we have greatly reduced her insulin  doses as she is no longer having glucotoxicity - continue frequent CBG monitoring as ordered 5x per day   Anemia of CKD Hemoglobin at baseline, management per renal   Hypothyroidism Continue Synthroid    Depression/anxiety Continue Klonopin , melatonin nightly, Effexor  daily   Constipation concern for fecal impaction -started on bowel regimen -5/9 molasses enema ordered -check portable abd xrays on 5/12- normal amount of stool reported - had BM on 10/05/23, 5/14 and  5/15 -continue miralax  daily      Consultants: renal Procedures performed: none  Disposition: Home Diet recommendation:  Carb modified diet DISCHARGE MEDICATION: Allergies as of 10/09/2023       Reactions   Cephalexin  Diarrhea, Other (See Comments)   Codeine  Nausea And Vomiting, Other (See Comments)        Medication List     TAKE these medications    acetaminophen  325 MG tablet Commonly known as: TYLENOL  Take 2 tablets (650 mg total) by mouth every 6 (six) hours as needed for mild pain (or Fever >/= 101).   albuterol  108 (90 Base) MCG/ACT inhaler Commonly known as: VENTOLIN  HFA Inhale 1-2 puffs into the lungs every 6 (six) hours as needed for shortness of breath.   amiodarone  200 MG tablet Commonly known as: PACERONE  Take 1 tablet (200 mg total) by mouth daily.   apixaban  5 MG Tabs tablet Commonly known as: ELIQUIS  Take 1 tablet (5 mg total) by mouth 2 (two) times daily.   ascorbic acid  500 MG tablet Commonly known as: VITAMIN C  Take 1 tablet (500 mg total) by mouth daily.   atorvastatin  80 MG tablet Commonly known as: LIPITOR  Take 1 tablet (80 mg total) by mouth daily.   calcitRIOL  0.25 MCG capsule Commonly known as: ROCALTROL  Take 9 capsules (2.25 mcg total) by mouth Every Tuesday,Thursday,and Saturday with dialysis.   clonazePAM  0.5 MG disintegrating tablet Commonly known as: KLONOPIN  Take 1 tablet (0.5 mg total) by mouth 2 (two) times daily. Stop lorazepam .   folic acid  1 MG tablet Commonly known as: FOLVITE  Take 1 mg by mouth daily.   folic acid -vitamin b complex-vitamin c -selenium-zinc  3 MG Tabs tablet Take 1 tablet by mouth daily.   levothyroxine  50 MCG tablet Commonly known as: SYNTHROID  Take 1 tablet (50 mcg total) by mouth daily before breakfast.   melatonin 3 MG Tabs tablet Take 3 mg by mouth at bedtime.   methocarbamol  750 MG tablet Commonly known as: ROBAXIN  Take 1 tablet (750 mg total) by mouth every 6 (six) hours as needed for muscle spasms.   midodrine  10 MG tablet Commonly known as: PROAMATINE  Take 1-2 tablets (10-20 mg total) by mouth Every Tuesday,Thursday,and Saturday with dialysis.   MIRCERA IJ Mircera   nitroGLYCERIN  0.4 MG SL tablet Commonly known as: NITROSTAT  Place 1 tablet  (0.4 mg total) under the tongue every 5 (five) minutes as needed. What changed: reasons to take this   Omnipod 5 DexG7G6 Intro Gen 5 Kit Inject 200 Units into the skin 3 days. Using Novolog  for pod   OXYGEN  Inhale 2 L into the lungs at bedtime.   pantoprazole  40 MG tablet Commonly known as: PROTONIX  Take 1 tablet (40 mg total) by mouth daily.   polyethylene glycol 17 g packet Commonly known as: MIRALAX  / GLYCOLAX  Take 17 g by mouth daily. What changed:  when to take this reasons to take this   sevelamer  carbonate 800 MG tablet Commonly known as: RENVELA  Take 1,600 mg by mouth 3 (three) times daily with meals.   venlafaxine  XR 75 MG 24 hr capsule Commonly known as: EFFEXOR -XR Take 1 capsule (75 mg total) by mouth daily with breakfast. Stop trintellix    Zinc  30 MG Caps Take 30 mg by mouth daily.        Follow-up Information     Bensimhon, Rheta Celestine, MD Follow up.   Specialty: Cardiology Why: The office should contact you within 2-3 business days to arrange a follow-up appointment. Contact information: [address]  [address] [place] [address] [phone]                Discharge Exam: Cleavon Curls Weights   10/06/23 1530 10/08/23 1222 10/08/23 1648  Weight: 76.2 kg 79 kg 76.5 kg   HEENT:  [place]/AT, No thrush, no icterus CV:  RRR, no rub, no S3, no S4 Lung:  bibasilar rales.  No wheeze Abd:  soft/+BS, NT Ext:  No edema, no lymphangitis, no synovitis, no rash   Condition at discharge: stable  The results of significant diagnostics from this hospitalization (including imaging, microbiology, ancillary and laboratory) are listed below for reference.   Imaging Studies: DG Abd Portable 2V Result Date: 10/05/2023 CLINICAL DATA:  Constipation.  Nausea.  Abdominal distention. EXAM: PORTABLE ABDOMEN - 2 VIEW COMPARISON:  12/08/2022 FINDINGS: Paucity of intestinal gas with no dilated bowel loops seen. Normal amount of stool. No free peritoneal air.  Double-lumen right sided double-lumen catheter tip at the superior cavoatrial junction. Coronary artery stents. Tortuous and partially calcified thoracic aorta. Lumbar and thoracic spine degenerative changes. IMPRESSION: Nonobstructive bowel gas pattern with a normal amount of stool. Electronically Signed   By: Catherin Closs M.D.   On: 10/05/2023 14:25   CT ABDOMEN PELVIS W CONTRAST Result Date: 10/01/2023 CLINICAL DATA:  Altered mental status.  Weakness. EXAM: CT ABDOMEN AND PELVIS WITH CONTRAST TECHNIQUE: Multidetector CT imaging of the abdomen and pelvis was performed using the standard protocol following bolus administration of intravenous contrast. RADIATION DOSE REDUCTION: This exam was performed according to the departmental dose-optimization program which includes automated exposure control, adjustment of the mA and/or kV according to patient size and/or use of iterative reconstruction technique. CONTRAST:  OMNIPAQUE  IOHEXOL  300 MG/ML  SOLN COMPARISON:  August 09, 2023. FINDINGS: Lower chest: Small right pleural effusion is noted. Minimal bilateral posterior basilar subsegmental atelectasis is noted. Hepatobiliary: Cholelithiasis is noted. No biliary dilatation is noted. Multiple small calcified granulomas are noted in the hepatic parenchyma. Pancreas: Unremarkable. No pancreatic ductal dilatation or surrounding inflammatory changes. Spleen: Multiple small calcified granulomas are noted. Adrenals/Urinary Tract: Adrenal glands appear normal. Bilateral renal cortical scarring is noted. Right renal cyst is noted. No hydronephrosis or renal obstruction is noted. Urinary bladder is decompressed. Stomach/Bowel: Stomach is unremarkable. There is no evidence of bowel obstruction. The appendix appears normal. Sigmoid diverticulosis is noted without inflammation. Moderate amount of stool seen in the rectum concerning for impaction. Vascular/Lymphatic: Aortic atherosclerosis. No enlarged abdominal or pelvic lymph  nodes. Reproductive: Status post hysterectomy. No adnexal masses. Other: No ascites or hernia. Musculoskeletal: No acute or significant osseous findings. IMPRESSION: Small right pleural effusion. Cholelithiasis. Bilateral renal cortical scarring. Sigmoid diverticulosis without inflammation. Moderate amount of stool seen in rectum concerning for impaction. Aortic Atherosclerosis (ICD10-I70.0). Electronically Signed   By: Rosalene Colon M.D.   On: 10/01/2023 16:07   CT HEAD WO CONTRAST ( ) Result Date: 10/01/2023 CLINICAL DATA:  Delirium. EXAM: CT HEAD WITHOUT CONTRAST TECHNIQUE: Contiguous axial images were obtained from the base of the skull through the vertex without intravenous contrast. RADIATION DOSE REDUCTION: This exam was performed according to the departmental dose-optimization program which includes automated exposure control, adjustment of the mA and/or kV according to patient size and/or use of iterative reconstruction technique. COMPARISON:  Head CT 01/15/2022 FINDINGS: Brain: There is no evidence of an acute infarct, intracranial hemorrhage, mass, midline shift, or extra-axial fluid collection. There is mild cerebral atrophy. Patchy cerebral white matter hypodensities have likely mildly progressed and are nonspecific but  compatible with moderate chronic small vessel ischemic disease. 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 Vascular: Calcified atherosclerosis at the skull base. No hyperdense vessel. Skull: No fracture or suspicious lesion. Sinuses/Orbits: Visualized paranasal sinuses and mastoid air cells are clear. Bilateral cataract extraction. Other: None. IMPRESSION: 1. No evidence of acute intracranial abnormality. 2. Moderate chronic small vessel ischemic disease. Electronically Signed   By: Aundra [name] M.D.   On: 10/01/2023 15:59   DG Chest Port 1 View Result Date: 10/01/2023 CLINICAL DATA:  Hypoxia. EXAM: PORTABLE CHEST 1 VIEW COMPARISON:  08/13/2023. FINDINGS: Stable  cardiomegaly. Right IJ dialysis catheter tip overlies the right atrium, unchanged. Aortic atherosclerosis. Central pulmonary vascular congestion with bilateral interstitial prominence, most pronounced at the lung bases, which could reflect interstitial pulmonary edema. Possible small left pleural effusion. No pneumothorax. No acute osseous abnormality. IMPRESSION: 1. Cardiomegaly with central pulmonary vascular congestion and findings suggestive of interstitial pulmonary edema. 2. Possible small left pleural effusion. Electronically Signed   By: Mannie Seek M.D.   On: 10/01/2023 13:59    Microbiology: Results for orders placed or performed during the hospital encounter of 10/01/23  Culture, blood (Routine X 2) w Reflex to ID Panel     Status: None   Collection Time: 10/01/23  2:54 PM   Specimen: BLOOD  Result Value Ref Range Status   Specimen Description BLOOD BLOOD LEFT ARM  Final   Special Requests   Final    BOTTLES DRAWN AEROBIC ONLY Blood Culture results may not be optimal due to an inadequate volume of blood received in culture bottles   Culture   Final    NO GROWTH 5 DAYS Performed at [facility], [address], [place], [place] [address]    Report Status 10/06/2023 FINAL  Final  Culture, blood (Routine X 2) w Reflex to ID Panel     Status: None   Collection Time: 10/01/23  2:54 PM   Specimen: BLOOD  Result Value Ref Range Status   Specimen Description BLOOD BLOOD RIGHT ARM  Final   Special Requests   Final    BOTTLES DRAWN AEROBIC ONLY Blood Culture results may not be optimal due to an inadequate volume of blood received in culture bottles   Culture   Final    NO GROWTH 5 DAYS Performed at [facility], [address], [place], [place] [address]    Report Status 10/06/2023 FINAL  Final  MRSA Next Gen by PCR, Nasal     Status: None   Collection Time: 10/02/23  3:52 AM   Specimen: Nasal Mucosa; Nasal Swab  Result Value Ref Range Status   MRSA by PCR Next Gen NOT DETECTED  NOT DETECTED Final    Comment: (NOTE) The GeneXpert MRSA Assay (FDA approved for NASAL specimens only), is one component of a comprehensive MRSA colonization surveillance program. It is not intended to diagnose MRSA infection nor to guide or monitor treatment for MRSA infections. Test performance is not FDA approved in patients less than 37 years old. Performed at [facility], [address], [place], [place] [address]    *Note: Due to a large number of results and/or encounters for the requested time period, some results have not been displayed. A complete set of results can be found in Results Review.    Labs: CBC: Recent Labs  Lab 10/03/23 0418 10/04/23 0417 10/05/23 0438 10/06/23 0413 10/07/23 0432  WBC 5.8 5.4 6.1 5.4 5.1  HGB 10.5* 9.8* 11.0* 10.1* 9.8*  HCT 33.9*  33.2* 36.3 33.5* 33.0*  MCV 90.2 90.2 90.8 89.3 90.9  PLT 313 274 230 205 191   Basic Metabolic Panel: Recent Labs  Lab 10/03/23 0418 10/04/23 0417 10/05/23 0438 10/06/23 0413 10/07/23 0432 10/08/23 0418 10/09/23 0435  NA 128*   < > 130* 130* 130* 132* 132*  K 4.0   < > 4.1 4.1 4.5 4.0 3.7  CL 89*   < > 94* 93* 96* 95* 97*  CO2 21*   < > 26 24 23 25 27   GLUCOSE 224*   < > 94 61* 404* 168* 256*  BUN 55*   < > 42* 49* 29* 40* 26*  CREATININE 6.35*   < > 4.74* 5.76* 3.88* 5.25* 3.32*  CALCIUM  8.2*   < > 8.8* 9.0 8.1* 8.8* 8.6*  MG 2.4  --   --   --   --   --   --   PHOS 5.3*   < > 3.6 4.2 4.0 4.2 2.8   < > = values in this interval not displayed.   Liver Function Tests: Recent Labs  Lab 10/05/23 0438 10/06/23 0413 10/07/23 0432 10/08/23 0418 10/09/23 0435  ALBUMIN  2.7* 2.4* 2.4* 2.3* 2.5*   CBG: Recent Labs  Lab 10/08/23 0736 10/08/23 1024 10/08/23 1751 10/08/23 2016 10/09/23 0325  GLUCAP 218* 262* 130* 156* 259*    Discharge time spent: greater than 30 minutes.  Signed: Demaris Fillers, MD [facility] 10/09/2023

## 2023-10-10 DIAGNOSIS — N2581 Secondary hyperparathyroidism of renal origin: Secondary | ICD-10-CM | POA: Diagnosis not present

## 2023-10-10 DIAGNOSIS — Z992 Dependence on renal dialysis: Secondary | ICD-10-CM | POA: Diagnosis not present

## 2023-10-10 DIAGNOSIS — D689 Coagulation defect, unspecified: Secondary | ICD-10-CM | POA: Diagnosis not present

## 2023-10-10 DIAGNOSIS — D631 Anemia in chronic kidney disease: Secondary | ICD-10-CM | POA: Diagnosis not present

## 2023-10-10 DIAGNOSIS — N186 End stage renal disease: Secondary | ICD-10-CM | POA: Diagnosis not present

## 2023-10-11 ENCOUNTER — Telehealth: Payer: Self-pay | Admitting: Physician Assistant

## 2023-10-11 NOTE — Telephone Encounter (Signed)
 Transition of care contact from inpatient facility  Date of Discharge: 10/09/23 Date of Contact: 10/11/23 Method of contact: Phone  Attempted to contact patient to discuss transition of care from inpatient admission. Patient did not answer the phone. Message was left on the patient's voicemail with call back instructions.

## 2023-10-12 ENCOUNTER — Ambulatory Visit: Admitting: Physical Therapy

## 2023-10-12 ENCOUNTER — Encounter: Payer: Self-pay | Admitting: Physical Therapy

## 2023-10-12 DIAGNOSIS — R2689 Other abnormalities of gait and mobility: Secondary | ICD-10-CM

## 2023-10-12 DIAGNOSIS — Z7409 Other reduced mobility: Secondary | ICD-10-CM

## 2023-10-12 DIAGNOSIS — M6281 Muscle weakness (generalized): Secondary | ICD-10-CM

## 2023-10-12 DIAGNOSIS — R2681 Unsteadiness on feet: Secondary | ICD-10-CM

## 2023-10-12 DIAGNOSIS — G546 Phantom limb syndrome with pain: Secondary | ICD-10-CM | POA: Diagnosis not present

## 2023-10-12 NOTE — Therapy (Signed)
 OUTPATIENT PHYSICAL THERAPY PROSTHETIC TREATMENT  Patient Name: Susan [name] MRN: [id number] DOB:[birth year], 73 y.o., female Today's Date: 10/12/2023  PCP: Austine Lefort, MD  REFERRING PROVIDER:  Gearldean Keepers, MD  END OF SESSION:  PT End of Session - 10/12/23 1345     Visit Number 41    Number of Visits 60    Date for PT Re-Evaluation 12/03/23    Authorization Type [employer]    Progress Note Due on Visit 46    PT Start Time 1345    PT Stop Time 1430    PT Time Calculation (min) 45 min    Equipment Utilized During Treatment Gait belt    Activity Tolerance Patient tolerated treatment well;Patient limited by fatigue    Behavior During Therapy WFL for tasks assessed/performed                      Past Medical History:  Diagnosis Date   Anemia    hx   Anxiety    Arthritis    "generalized" (03/15/2014)   CAD (coronary artery disease)    MI in 2000 - MI  2007 - treated bare metal stent (no nuclear since then as 9/11)   Carotid artery disease (HCC)    Chronic diastolic heart failure (HCC)    a) ECHO (08/2013) EF 55-60% and RV function nl b) RHC (08/2013) RA 4, RV 30/5/7, PA 25/10 (16), PCWP 7, Fick CO/CI 6.3/2.7, PVR 1.5 WU, PA 61 and 66%   Daily headache    "~ every other day; since I fell in June" (03/15/2014)   Depression    Diabetic retinopathy (HCC)    Dyslipidemia    ESRD (end stage renal disease) (HCC)    Dialysis on Tues Thurs Sat   Exertional shortness of breath    GERD (gastroesophageal reflux disease)    History of blood transfusion    History of kidney stones    HTN (hypertension)    Hypothyroidism    Myocardial infarction (HCC)    Obesity    Osteoarthritis    PAF (paroxysmal atrial fibrillation) (HCC)    Peripheral neuropathy    bilateral feet/hands   PONV (postoperative nausea and vomiting)    RBBB (right bundle branch block)    Old   Stroke (HCC)    mini strokes   Type II diabetes mellitus (HCC)    Type II, [name]  libre left upper arm. patient has omnipod insulin  pump with Novolin R Insulin    Past Surgical History:  Procedure Laterality Date   A/V FISTULAGRAM Left 11/07/2022   Procedure: A/V Fistulagram;  Surgeon: Carlene Che, MD;  [facility]: [facility];  Service: Cardiovascular;  Laterality: Left;   ABDOMINAL HYSTERECTOMY  1980's   AMPUTATION Right 02/24/2018   Procedure: RIGHT FOOT GREAT TOE AND 2ND TOE AMPUTATION;  Surgeon: Timothy Ford, MD;  Location: [facility] OR;  Service: Orthopedics;  Laterality: Right;   AMPUTATION Right 04/30/2018   Procedure: RIGHT TRANSMETATARSAL AMPUTATION;  Surgeon: Timothy Ford, MD;  Location: [facility] OR;  Service: Orthopedics;  Laterality: Right;   AMPUTATION Right 05/02/2022   Procedure: RIGHT BELOW KNEE AMPUTATION;  Surgeon: Timothy Ford, MD;  Location: [facility] OR;  Service: Orthopedics;  Laterality: Right;   APPLICATION OF WOUND VAC Right 06/13/2022   Procedure: APPLICATION OF WOUND VAC;  Surgeon: Timothy Ford, MD;  Location: [facility] OR;  Service: Orthopedics;  Laterality: Right;   APPLICATION OF WOUND VAC Left 11/14/2022  Procedure: APPLICATION OF WOUND VAC;  Surgeon: Timothy Ford, MD;  Location: [facility] OR;  Service: Orthopedics;  Laterality: Left;   AV FISTULA PLACEMENT Left 04/02/2022   Procedure: LEFT ARM ARTERIOVENOUS (AV) FISTULA CREATION;  Surgeon: Margherita Shell, MD;  Location: [facility] OR;  Service: Vascular;  Laterality: Left;  PERIPHERAL NERVE BLOCK   AV FISTULA PLACEMENT Right 04/27/2023   Procedure: RIGHT ARM BRACHIOBASILIC ARTERIOVENOUS (AV) FISTULA CREATION;  Surgeon: Carlene Che, MD;  Location: [facility] OR;  Service: Vascular;  Laterality: Right;   BASCILIC VEIN TRANSPOSITION Left 07/31/2022   Procedure: LEFT ARM SECOND STAGE BASILIC VEIN TRANSPOSITION;  Surgeon: Margherita Shell, MD;  Location: [facility] OR;  Service: Vascular;  Laterality: Left;   BIOPSY  05/27/2020   Procedure: BIOPSY;  Surgeon: Vinetta Greening, DO;  Location: [facility];  Service: Endoscopy;;    CATARACT EXTRACTION, BILATERAL Bilateral ?2013   COLONOSCOPY W/ POLYPECTOMY     COLONOSCOPY WITH PROPOFOL  N/A 03/13/2019   Procedure: COLONOSCOPY WITH PROPOFOL ;  Surgeon: Nannette Babe, MD;  Location: [facility] ENDOSCOPY;  Service: Gastroenterology;  Laterality: N/A;   CORONARY ANGIOPLASTY WITH STENT PLACEMENT  1999; 2007   "1 + 1"   ERCP N/A 02/03/2022   Procedure: ENDOSCOPIC RETROGRADE CHOLANGIOPANCREATOGRAPHY (ERCP);  Surgeon: Alvis Jourdain, MD;  Location: [facility] ENDOSCOPY;  Service: Gastroenterology;  Laterality: N/A;   ESOPHAGOGASTRODUODENOSCOPY N/A 02/12/2023   Procedure: ESOPHAGOGASTRODUODENOSCOPY (EGD);  Surgeon: Alvis Jourdain, MD;  Location: [facility] ENDOSCOPY;  Service: Gastroenterology;  Laterality: N/A;   ESOPHAGOGASTRODUODENOSCOPY (EGD) WITH PROPOFOL  N/A 03/13/2019   Procedure: ESOPHAGOGASTRODUODENOSCOPY (EGD) WITH PROPOFOL ;  Surgeon: Nannette Babe, MD;  Location: [facility] ENDOSCOPY;  Service: Gastroenterology;  Laterality: N/A;   ESOPHAGOGASTRODUODENOSCOPY (EGD) WITH PROPOFOL  N/A 05/27/2020   Procedure: ESOPHAGOGASTRODUODENOSCOPY (EGD) WITH PROPOFOL ;  Surgeon: Vinetta Greening, DO;  [facility];  Service: Endoscopy;  Laterality: N/A;   ESOPHAGOGASTRODUODENOSCOPY (EGD) WITH PROPOFOL  N/A 09/03/2022   Procedure: ESOPHAGOGASTRODUODENOSCOPY (EGD) WITH PROPOFOL ;  Surgeon: Alvis Jourdain, MD;  Location: [facility] ENDOSCOPY;  Service: Gastroenterology;  Laterality: N/A;   EYE SURGERY Bilateral    lazer   FLEXIBLE SIGMOIDOSCOPY N/A 05/23/2022   Procedure: FLEXIBLE SIGMOIDOSCOPY;  Surgeon: Alvis Jourdain, MD;  Location: [facility] ENDOSCOPY;  Service: Gastroenterology;  Laterality: N/A;   FLEXIBLE SIGMOIDOSCOPY N/A 05/24/2022   Procedure: FLEXIBLE SIGMOIDOSCOPY;  Surgeon: Daina Drum, MD;  Location: [facility] ENDOSCOPY;  Service: Gastroenterology;  Laterality: N/A;   FLEXIBLE SIGMOIDOSCOPY N/A 09/03/2022   Procedure: FLEXIBLE SIGMOIDOSCOPY;  Surgeon: Alvis Jourdain, MD;  Location: [facility] ENDOSCOPY;  Service: Gastroenterology;   Laterality: N/A;   HEMOSTASIS CLIP PLACEMENT  03/13/2019   Procedure: HEMOSTASIS CLIP PLACEMENT;  Surgeon: Nannette Babe, MD;  Location: [facility] ENDOSCOPY;  Service: Gastroenterology;;   HEMOSTASIS CLIP PLACEMENT  05/23/2022   Procedure: HEMOSTASIS CLIP PLACEMENT;  Surgeon: Alvis Jourdain, MD;  Location: [facility] ENDOSCOPY;  Service: Gastroenterology;;   HEMOSTASIS CONTROL  05/24/2022   Procedure: HEMOSTASIS CONTROL;  Surgeon: Daina Drum, MD;  Location: [facility] ENDOSCOPY;  Service: Gastroenterology;;   HOT HEMOSTASIS N/A 05/23/2022   Procedure: HOT HEMOSTASIS (ARGON PLASMA COAGULATION/BICAP);  Surgeon: Alvis Jourdain, MD;  Location: [facility] ENDOSCOPY;  Service: Gastroenterology;  Laterality: N/A;   I & D EXTREMITY Left 05/05/2022   Procedure: IRRIGATION AND DEBRIDEMENT LEFT ARM AV FISTULA;  Surgeon: Young Hensen, MD;  Location: [facility] OR;  Service: Vascular;  Laterality: Left;   I & D EXTREMITY N/A 11/14/2022   Procedure: IRRIGATION AND DEBRIDEMENT OF LOWER EXTREMITY WOUND;  Surgeon: Timothy Ford,  MD;  Location: [facility] OR;  Service: Orthopedics;  Laterality: N/A;   INSERTION OF DIALYSIS CATHETER Right 04/02/2022   Procedure: INSERTION OF TUNNELED DIALYSIS CATHETER;  Surgeon: Margherita Shell, MD;  Location: [facility] OR;  Service: Vascular;  Laterality: Right;   IR FLUORO GUIDE CV LINE RIGHT  08/10/2023   IR VENOCAVAGRAM IVC  08/10/2023   KNEE ARTHROSCOPY Left 10/25/2006   POLYPECTOMY  03/13/2019   Procedure: POLYPECTOMY;  Surgeon: Nannette Babe, MD;  Location: [facility] ENDOSCOPY;  Service: Gastroenterology;;   REMOVAL OF STONES  02/03/2022   Procedure: REMOVAL OF STONES;  Surgeon: Alvis Jourdain, MD;  Location: [facility] ENDOSCOPY;  Service: Gastroenterology;;   REVISON OF ARTERIOVENOUS FISTULA Left 08/20/2022   Procedure: REVISON OF LEFT ARM ARTERIOVENOUS FISTULA;  Surgeon: Margherita Shell, MD;  [facility] OR;  Service: Vascular;  Laterality: Left;   RIGHT HEART CATH N/A 07/24/2017   Procedure: RIGHT HEART CATH;  Surgeon:  Mardell Shade, MD;  [facility];  Service: Cardiovascular;  Laterality: N/A;   RIGHT HEART CATHETERIZATION N/A 09/22/2013   Procedure: RIGHT HEART CATH;  Surgeon: Mardell Shade, MD;  Location: [facility] CATH LAB;  Service: Cardiovascular;  Laterality: N/A;   SHOULDER ARTHROSCOPY WITH OPEN ROTATOR CUFF REPAIR Right 03/14/2014   Procedure: RIGHT SHOULDER ARTHROSCOPY WITH BICEPS RELEASE, OPEN SUBSCAPULA REPAIR, OPEN SUPRASPINATUS REPAIR.;  Surgeon: Jasmine Mesi, MD;  Location: [facility] OR;  Service: Orthopedics;  Laterality: Right;   SPHINCTEROTOMY  02/03/2022   Procedure: SPHINCTEROTOMY;  Surgeon: Alvis Jourdain, MD;  Location: [facility] ENDOSCOPY;  Service: Gastroenterology;;   STUMP REVISION Right 06/13/2022   Procedure: REVISION RIGHT BELOW KNEE AMPUTATION;  Surgeon: Timothy Ford, MD;  Location: [facility] OR;  Service: Orthopedics;  Laterality: Right;   TEE WITHOUT CARDIOVERSION N/A 02/04/2022   Procedure: TRANSESOPHAGEAL ECHOCARDIOGRAM (TEE);  Surgeon: Mardell Shade, MD;  Location: [facility] ENDOSCOPY;  Service: Cardiovascular;  Laterality: N/A;   THROMBECTOMY W/ EMBOLECTOMY Left 08/20/2022   Procedure: THROMBECTOMY OF LEFT ARM ARTERIOVENOUS FISTULA;  Surgeon: Margherita Shell, MD;  Location: [facility] OR;  Service: Vascular;  Laterality: Left;   TOE AMPUTATION Right 02/24/2018   GREAT TOE AND 2ND TOE AMPUTATION   TUBAL LIGATION  1970's   UPPER EXTREMITY VENOGRAPHY N/A 07/31/2023   Procedure: UPPER EXTREMITY VENOGRAPHY;  Surgeon: Carlene Che, MD;  Location: [facility];  Service: Cardiovascular;  Laterality: N/A;   Patient Active Problem List   Diagnosis Date Noted   UTI (urinary tract infection) 10/01/2023   Pyelonephritis 08/06/2023   Complicated UTI (urinary tract infection) 08/05/2023   Atrial fibrillation (HCC) 06/17/2023   Atopic dermatitis 06/17/2023   Hyperosmolar hyperglycemic state (HHS) (HCC) 02/05/2023   Gastroparesis 02/05/2023   Depression 01/29/2023   Intractable  nausea and vomiting 01/20/2023   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 difficile colitis 06/05/2022   Spleen hematoma 06/05/2022   Dehiscence of amputation stump of right lower extremity (HCC) 06/05/2022   Rectal ulcer 05/27/2022   ESRD (  end stage renal disease) (HCC) 05/27/2022   GI bleed 05/23/2022   Difficult intravenous access 05/23/2022   Gangrene of right foot (HCC) 05/02/2022   S/P BKA (below knee amputation) unilateral, right (HCC) 05/02/2022   Unspecified protein-calorie malnutrition (HCC) 04/15/2022   Secondary hyperparathyroidism of renal origin (HCC) 04/14/2022   Coagulation defect, unspecified (HCC) 04/09/2022   Acquired absence of other left toe(s) (HCC) 04/07/2022   Allergy, unspecified, initial encounter 04/07/2022   Dependence on renal dialysis (HCC) 04/07/2022   Gout due to renal impairment, unspecified site 04/07/2022   Hypertensive heart and chronic kidney disease with heart failure and with stage 5 chronic kidney disease, or end stage renal disease (HCC) 04/07/2022   Personal history of transient ischemic attack (TIA), and cerebral infarction without residual deficits 04/07/2022   Renal osteodystrophy 04/07/2022   Venous stasis ulcer of right calf (HCC) 03/31/2022   Fistula, colovaginal 03/26/2022   Diarrhea 03/26/2022   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   Sepsis without acute organ dysfunction (HCC)     Bacteremia    Acute pancreatitis 02/01/2022   Abdominal pain 02/01/2022   SIRS (systemic inflammatory response syndrome) (HCC) 02/01/2022   Transaminitis 02/01/2022   History of anemia due to chronic kidney disease 02/01/2022   Paroxysmal atrial fibrillation (HCC) 02/01/2022   Uncontrolled type 2 diabetes mellitus with hyperglycemia, with long-term current use of insulin  (HCC) 01/14/2022   NSTEMI (non-ST elevated myocardial infarction) (HCC) 03/05/2021   Acute renal failure superimposed on stage 4 chronic kidney disease (HCC) 08/22/2020   Hypoalbuminemia 05/25/2020   GERD (gastroesophageal reflux disease) 05/25/2020   Pressure injury of skin 05/17/2020   Acute on chronic combined systolic and diastolic congestive heart failure (HCC) 03/07/2020   Type 2 diabetes mellitus with diabetic polyneuropathy, with long-term current use of insulin  (HCC) 03/07/2020   Obesity, Class III, BMI 40-49.9 (morbid obesity) 03/07/2020   Common bile duct (CBD) obstruction 05/28/2019   Benign neoplasm of ascending colon    Benign neoplasm of transverse colon    Benign neoplasm of descending colon    Benign neoplasm of sigmoid colon    Gastric polyps    Hyperkalemia 03/11/2019   Prolonged QT interval 03/11/2019   Acute blood loss anemia 03/11/2019   Onychomycosis 06/21/2018   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)    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 (HCC)    PVD (peripheral vascular disease) (HCC) 10/26/2017   E-coli UTI 07/27/2017   Hypothyroidism 07/27/2017   AKI (acute kidney injury) (HCC)    PAH (pulmonary artery hypertension) (HCC)    Impaired ambulation 07/19/2017   Leg cramps 02/27/2017   Peripheral edema 01/12/2017   Diabetic neuropathy (HCC) 11/12/2016   Anemia of chronic disease 10/03/2015   Historical diagnosis of generalized anxiety disorder 10/03/2015   Secondary insomnia 10/03/2015   Acute bronchitis 09/05/2015   Hyperglycemia due to diabetes mellitus  (HCC) 06/07/2015   Non compliance with medical treatment 04/17/2014   Rotator cuff tear 03/14/2014   Obesity 09/23/2013   Chronic HFrEF (heart failure with reduced ejection fraction) (HCC) 06/03/2013   Hypotension 12/25/2012   Hypokalemia 12/25/2012   Hyponatremia 12/25/2012   Urinary incontinence    MDD (major depressive disorder), recurrent episode, moderate (HCC) 11/12/2010   RBBB (right bundle branch block)    Wide-complex tachycardia    Coronary artery disease    Hyperlipemia 01/22/2009   Chronic hypotension 01/22/2009    ONSET DATE: 03/09/2023 MD referral to PT  REFERRING DIAG: A54.098J (ICD-10-CM) -  Below-knee amputation of right lower extremity, subsequent encounter   THERAPY DIAG:  Muscle weakness (generalized)  Other abnormalities of gait and mobility  Unsteadiness on feet  Impaired functional mobility, balance, gait, and endurance  Phantom limb syndrome with pain (HCC)  Rationale for Evaluation and Treatment: Rehabilitation  SUBJECTIVE:  SUBJECTIVE STATEMENT: Patient was hospitalized 10/01/2023 - 10/09/2023 with UTI, constipation, vomiting & hypoglycemia. She is having neck muscle spasms on Thursday, 10/08/2023.  She wore prosthesis limited time when hospitalized but has returned to most of awake hours now that she is home.   PERTINENT HISTORY: Right TTA 05/02/22, Lymphedema, idiopathic chronic venous HTN with ulcer, depression, colitis, diverticulitis, ESRD, gout, NSTEMI, DM2, polyneuropathy, PVD, CAD, right bundle branch block, mini strokes  PAIN:  Are you having pain?  Back pain today  8/10 upon arrival Neck pain 9/10 Right shoulder 9/10  Right shoulder today at rest 4-5/10 with moving 8-9/10;  pillow for support 5/10 (getting better sleep).   PRECAUTIONS: Fall and Other: No BP LUE  WEIGHT BEARING RESTRICTIONS: No  FALLS: Has patient fallen in last 6 months? No  LIVING ENVIRONMENT: Lives with: lives with their spouse and 2 small dogs Lives in: mobile  home Home Access: Ramped entrance Home layout: One level Stairs: Yes: External: 6-7 steps; can reach both Has following equipment at home: Single point cane, Environmental consultant - 2 wheeled, Environmental consultant - 4 wheeled, Wheelchair (manual), [employer], Grab bars, and Ramped entry  OCCUPATION:  retired  PLOF: prior to amputation for ~year used RW in home & community  PATIENT GOALS:  to use prosthesis to walk, get out w/c in & out of car, get to bathroom.   OBJECTIVE:  COGNITION: Overall cognitive status:  Eval on 03/23/2023:   Within functional limits for tasks assessed  POSTURE:  Eval on 03/23/2023:  rounded shoulders, forward head, increased thoracic kyphosis, flexed trunk , and weight shift left  LOWER EXTREMITY ROM: ROM Right eval Left eval  Hip flexion    Hip extension    Hip abduction    Hip adduction    Hip internal rotation    Hip external rotation    Knee flexion    Knee extension    Ankle dorsiflexion    Ankle plantarflexion    Ankle inversion    Ankle eversion     (Blank rows = not tested)  LOWER EXTREMITY MMT:  MMT Right eval Left eval Right 09/09/23 Left 09/09/23  Hip flexion 3-/5 3-/5 3/5 3/5  Hip extension 2/5 2/5 3-/5 3-/5  Hip abduction 2+/5 2+/5 3-/5 3-/5  Hip adduction      Hip internal rotation      Hip external rotation      Knee flexion 3-/5 3-/5 3/5 3/5  Knee extension 3-/5 3-/5 3/5 3/5  Ankle dorsiflexion      Ankle plantarflexion      Ankle inversion      Ankle eversion      At Evaluation all strength testing is grossly seated and functionally standing / gait. (Blank rows = not tested)  TRANSFERS: 09/07/2023: Scooting transfers with minA and sit to/from stand transfers with modA.   07/27/2023: -Sit>stand from wc with RW: MinA with heavy vc for sequencing and offweighting LE -Stand>sit wc with RW: CGA with patient performing safe controlled descent  06/15/2023: -Sit to stand transfers with RW wheelchair<>mat table at varied height: 19in with maxA to  rise to stand; 22in ModA rise to stand, 24in min-modA rise to stand. -stand pivot transfer with  RW w/c to mat table with modA once standing.  -Lateral scooting wc<>table at level height SBA; vc for LE positioning for optimal use of LE during transfer -lateral scoot R: modA due to fatigue and uneven surface transfer from low surface to higher surface   05/04/2023: Squat pivot transfers with modA.  04/21/2023: Pt sit to stand w/c to //bar with one hand pushing on w/c and other hand on //bar with modA first time & MinA 2nd/ 3rd time.  Best is RUE on //bar & LUE on w/c.  Stand to sit reaching LUE to w/c with minA to control descent.   PT demo & verbal cues on sit to/from stand w/c to RW.  1st time modA 2 person. 2nd time maxA one person.  Stand to sit with minA and constant cueing. Scooting transfers with minA with w/c & mat direct contact & same ht.   Eval on 03/23/2023:  Sit to stand: Max A (two-person assist) from 22" w/c to rolling walker Stand to sit: Max A (two-person assist) rolling walker to wheelchair  FUNCTIONAL TESTs:   09/09/2023: Patient able to tolerate static stance for 5 minutes with BUE support on RW with supervision.  This was current certification goal set so that patient can stand long enough for her husband to assist with cleaning her after toileting.  07/27/2023: Patient stood with RW SBA for 54sec before c/o lightheadedness and sitting down to toilet. Resisted nudges applied posteriorly to simulate hygienic cleaning. VC for weight shifts when LE become tired.  BP: after standing 95min54sec 161/82 HR 92  06/15/2023: -Static stance for standing with RW; requiring Mod-MaxA for rise to stand d/t patient unsteady on feet posterior lean without proper righting strategy, SBA for stand   04/21/2023: Pt able to stand 60 sec with BUE on //bars with supervision.  Pt able to stand for 1 min with RW support with minA.  Eval on 03/23/2023: Patient maintained upright  holding rolling walker with mod assist 2 people for safety for 30 seconds.  GAIT: 09/09/2023: Patient ambulated 30' with RW including turning 90*to position to sit with mod assistance  07/27/2023: Patient ambulated 33ft with narrow bathroom entry and 90deg turn to sit on commode CGA with minA and heavy vc for progression and sequencing of RW. While turning to sit on commode patient lost balance laterally requiring mod-maxA from therapist and patient use of grab bar to regain balance.   06/17/2023: Patient ambulated 73ft (maximal tolerated distance) with RW ModA (2 person for safety). Required VC for LE sequencing and minA to steer walker and prevent tip over.   04/21/2023: Pt amb 5' in //bars with modA (2 person for safety) with PT verbal & tactile cues on sequence and weight shift.  Pt amb 10' with RW with maxA (2 person for safety) with PT verbal & tactile cues on sequence and weight shift.  Eval on 03/23/2023:  Patient took 2 steps (one per LE) with +2 max assist with rolling walker and TTA prosthesis.  Patient adducting prosthesis stepping on left foot with no awareness.  CURRENT PROSTHETIC WEAR ASSESSMENT: 09/07/2023: Patient and husband are independent with: skin check, prosthetic cleaning, ply sock cleaning, proper wear schedule/adjustment, residual limb care, correct ply sock adjustment, and proper weight-bearing schedule/adjustment Pt reports wear most of awake hours on dialysis and non-dialysis days.  She wears prosthesis to dialysis now to assist with transfers prn and is able to verbalize proper adjustment of prosthesis weight for weigh-in & weigh-out.  06/17/2023:  Independence with prosthetic care:  Patient and husband are independent with: skin check, prosthetic cleaning, ply sock cleaning, proper wear schedule/adjustment Patient is dependent with: residual limb care, correct ply sock adjustment, and proper weight-bearing schedule/adjustment Donning prosthesis: husband is  independent with proper donning. Patient requires MaxA. Patient can verbally direct someone how to properly assist. Doffing prosthesis: Husband is independent with proper donning. Patient requires modA. Patient can verbally direct someone how to properly assist. Prosthetic wear tolerance: majority of awake hours including dialysis hours, 1x/day, 3-4 days/week Prosthetic weight bearing tolerance: able to tolerate of standing without complains of prosthetic limb pain. Time not limited by limb discomfort but limited by muscle fatigue.   Eval on 03/23/2023:   Patient is dependent with: skin check, residual limb care, prosthetic cleaning, ply sock cleaning, correct ply sock adjustment, proper wear schedule/adjustment, and proper weight-bearing schedule/adjustment Donning prosthesis: Max A Doffing prosthesis: Min A Prosthetic wear tolerance: 2-6 hours, 1x/day, 3-4 days/week Prosthetic weight bearing tolerance: 3 minutes with limb pain Edema: pitting edema Residual limb condition: No open areas, dry skin, normal color and temperature, cylindrical shape Prosthetic description: Silicone liner with pin lock suspension, total contact socket with flexible inner socket, sach foot    TODAY'S TREATMENT:                                                                                                             DATE: 10/12/2023 Therapeutic Activities with Transtibial Prosthesis: - Pt able to perform scooting / small squat pivot transfer w/c to adjacent bariatric chair (similar to her BSC) with minA/CGA with cues on technique.  PT continues to recommend transferring to her [facility] with husband's assist daily to attempt bowel movement.  Currently she has bowel movements in her diaper and husband cleans her as soon as able.  When they are out of house like medical appointments this may not be immediately.  PT educated pt & husband that this can be large contributing factor to her recurrent UTIs.   Self-Care: -PT  demo & verbal cues on modifications to enable more independence in managing hair care by herself. PT recommended placing forehead on towel roll on table top and elbows table so she can get her hands to her own hair.  Pt able to return demo without issues with her shoulders especially RUE.    Therapeutic Exercise: AROM cervical rotation right/left, side bend right/left, flexion & extension 5 reps ea. Pt verbalized understanding to add to HEP while neck pain is an issue.     TREATMENT:  DATE: 09/23/2023 Prosthetic Training with Transtibial Prosthesis: - Pt sit to stand wheelchair to RW with minA.  Patient able to stand upright for 3 minutes with RW support with supervision.  Patient had controlled stand to sit without assistance.  Therapeutic exercise: - PT reprinted patient HEP HO.  PT reviewed HEP handout with 2 groups of exercises.  First group is for exercises to be performed while patient is in the bed: Bridge, lower body trunk rotation, heel slides with strap assist and hip Abd/adduction.  Second group is seated exercises with patient sitting in wheelchair feet on the floor position at the edge of the wheelchair: Forward trunk lean to upright for back extension, leaning back with set up for abdominals, lateral trunk lean including how to protect right shoulder, trunk rotation, marching and stepping lower extremity out/back.  Patient performed 5 reps of all exercises with understanding PT is recommending 2 sets of 5 reps per HEP.  PT strongly discussed need to perform HEP daily on all days that she is not in dialysis and if tolerated after dialysis.  Patient and husband verbalized understanding of HEP.    TREATMENT:                                                                                                             DATE: 09/21/2023 Prosthetic Training with Transtibial Prosthesis: -  PT demo & verbal cues on sequencing for straight path gait with RW, turning 90* to right & to left to position to sit. (advancing rolling walker after each footsteps until the back legs of RW are even with her front toes to keep walker closer and improve step through gait pattern for balance).  Verbal cues to breathe as she walks. -pt amb 10' 15\' 10" , 15" with RW with minA for straight path and min to modA to turn 90* to position to sit.   -Sit to stand X 6 with RW throughout the session with cues to lean forward more upon rising to improve performance and require less assistance with this, did improve some with practice  Therapeutic Exercise: Seated in wheelchair, lumbar flexion stretch with pball roll outs 5 sec X 10 Seated in wheelchair, lumbar extension into yellow ball roll 5 sec X 10 Seated in wheelchair with back off of backrest for more core activation performing lumbar lateral flexion AROM X 10 each side Nu step seat #9 X 5 min UE/LE, discontinued using Rt UE do to pain    PATIENT EDUCATION: PATIENT EDUCATED ON FOLLOWING PROSTHETIC CARE: Education details: Use of stockinette under proximal liner to decrease itching sensation, no wounds presents to PT recommended not using Vive wear under liner Skin check, Prosthetic cleaning, Propper donning, and Proper wear schedule/adjustment Prosthetic wear tolerance: 3 hours 2x/day, 4 days/week Person educated: Patient and Spouse Education method: Explanation, Tactile cues, and Verbal cues Education comprehension: verbalized understanding, verbal cues required, tactile cues required, and needs further education  HOME EXERCISE PROGRAM: Access Code: [email or web address] URL: [email or web address] Date: 09/23/2023 Prepared by: Lorie Rook  Exercises - Supine Bridge  -  1 x daily - 4 x weekly - 2 sets - 5 reps - 2 seconds hold - Supine Lower Trunk Rotation  - 1 x daily - 4 x weekly - 2 sets - 5 reps - 5 seconds hold - Supine Heel Slide with  Strap  - 1 x daily - 4 x weekly - 2 sets - 5 reps - 2-3 seconds hold - Supine Hip Abduction  - 1 x daily - 4 x weekly - 2 sets - 5 reps - 2-3 seconds hold - Seated Hip Flexion Toward Target  - 1 x daily - 4 x weekly - 2 sets - 5 reps - 5 seconds hold - Seated Eccentric Abdominal Lean Back  - 1 x daily - 4 x weekly - 2 sets - 5 reps - 5 seconds hold - Seated Sidebending  - 1 x daily - 4 x weekly - 2 sets - 5 reps - 5 seconds hold - Seated Trunk Rotation with Crossed Arms  - 1 x daily - 4 x weekly - 2 sets - 5 reps - 5 seconds hold - Seated March  - 1 x daily - 4 x weekly - 2 sets - 10 reps - 5 seconds hold - Seated Knee Flexion Extension AROM   - 1 x daily - 4 x weekly - 2 sets - 10 reps - 5 seconds hold    ASSESSMENT:  CLINICAL IMPRESSION: Patient appears to understand PT recommendations for transfers for toileting, modifications for hair care and cervical AROM exercises.  Patient continues to benefit from skilled PT.  OBJECTIVE IMPAIRMENTS: Abnormal gait, decreased activity tolerance, decreased balance, decreased endurance, decreased knowledge of condition, decreased knowledge of use of DME, decreased mobility, difficulty walking, decreased ROM, decreased strength, decreased safety awareness, postural dysfunction, prosthetic dependency , and pain.   ACTIVITY LIMITATIONS: standing, transfers, and locomotion level  PARTICIPATION LIMITATIONS: community activity, household mobility and dependency / burden of care on family  PERSONAL FACTORS: Age, Fitness, Past/current experiences, Time since onset of injury/illness/exacerbation, and 3+ comorbidities: see PMH are also affecting patient's functional outcome.   REHAB POTENTIAL: Good  CLINICAL DECISION MAKING: Evolving/moderate complexity  EVALUATION COMPLEXITY: Moderate   GOALS: Goals reviewed with patient? Yes  SHORT TERM GOALS: Target date: 10/07/2023:  Patient & husband verbalized patient able to perform scooting transfer to / from [facility]  and sit to/from stand [facility] to RW with husband's assistance safely. Baseline: SEE OBJECTIVE DATA Goal status:   Ongoing   10/05/2023 2.  Patient able to sit to stand w/c to rolling walker with min assistance. Baseline: SEE OBJECTIVE DATA Goal status: Ongoing  10/05/2023  3. Patient ambulates 80ft including turning 90* to position to sit with RW & prosthesis with modA.  Baseline: Ongoing  10/05/2023  UPDATED LONG TERM GOALS: Target date: 12/03/2023  Patient & husband continues to demonstrate & verbalize understanding of prosthetic care and wears prosthesis >80% of awake hours daily to enable safe utilization of prosthesis. Baseline: SEE OBJECTIVE DATA Goal status: Ongoing   10/05/2023  Patient and husband report patient is able to stand to enable cleaning after toileting safely with no issues Baseline: SEE OBJECTIVE DATA Goal status: Ongoing 10/05/2023  Patient able to perform sit to/from stand transfers with minA.  Baseline: SEE OBJECTIVE DATA Goal status: Ongoing  10/05/2023  Patient ambulates >66ft including turning 90* to position to sit with prosthesis & RW with minA with husband's assistance safely. Baseline: SEE OBJECTIVE DATA Goal status: Ongoing  10/05/2023  Patient understanding of ongoing  HEP. Baseline: SEE OBJECTIVE DATA Goal status:  Ongoing  10/05/2023  PLAN:  PT FREQUENCY: 2x/week  PT DURATION: 12 weeks  PLANNED INTERVENTIONS: 97164- PT Re-evaluation, 97110-Therapeutic exercises, 97530- Therapeutic activity, 97112- Neuromuscular re-education, [phone]- Self Care, [phone]- Gait training, [phone]- Prosthetic training, Patient/Family education, Balance training, DME instructions, Therapeutic exercises, Therapeutic activity, Neuromuscular re-education, Gait training, and Self Care  PLAN FOR NEXT SESSION:   Check on compliance with HEP.  Check updated STG's.   Lorie Rook, PT, DPT 10/12/2023, 4:01 PM

## 2023-10-13 ENCOUNTER — Ambulatory Visit: Admitting: Vascular Surgery

## 2023-10-13 DIAGNOSIS — Z992 Dependence on renal dialysis: Secondary | ICD-10-CM | POA: Diagnosis not present

## 2023-10-13 DIAGNOSIS — E1122 Type 2 diabetes mellitus with diabetic chronic kidney disease: Secondary | ICD-10-CM | POA: Diagnosis not present

## 2023-10-13 DIAGNOSIS — D509 Iron deficiency anemia, unspecified: Secondary | ICD-10-CM | POA: Diagnosis not present

## 2023-10-13 DIAGNOSIS — E039 Hypothyroidism, unspecified: Secondary | ICD-10-CM | POA: Diagnosis not present

## 2023-10-13 DIAGNOSIS — D689 Coagulation defect, unspecified: Secondary | ICD-10-CM | POA: Diagnosis not present

## 2023-10-13 DIAGNOSIS — N179 Acute kidney failure, unspecified: Secondary | ICD-10-CM | POA: Diagnosis not present

## 2023-10-13 DIAGNOSIS — N2581 Secondary hyperparathyroidism of renal origin: Secondary | ICD-10-CM | POA: Diagnosis not present

## 2023-10-13 DIAGNOSIS — N186 End stage renal disease: Secondary | ICD-10-CM | POA: Diagnosis not present

## 2023-10-13 DIAGNOSIS — N390 Urinary tract infection, site not specified: Secondary | ICD-10-CM | POA: Diagnosis not present

## 2023-10-14 ENCOUNTER — Ambulatory Visit: Admitting: Physical Therapy

## 2023-10-14 ENCOUNTER — Encounter: Payer: Self-pay | Admitting: Physical Therapy

## 2023-10-14 DIAGNOSIS — R2681 Unsteadiness on feet: Secondary | ICD-10-CM

## 2023-10-14 DIAGNOSIS — M6281 Muscle weakness (generalized): Secondary | ICD-10-CM

## 2023-10-14 DIAGNOSIS — G546 Phantom limb syndrome with pain: Secondary | ICD-10-CM | POA: Diagnosis not present

## 2023-10-14 DIAGNOSIS — Z7409 Other reduced mobility: Secondary | ICD-10-CM | POA: Diagnosis not present

## 2023-10-14 DIAGNOSIS — R2689 Other abnormalities of gait and mobility: Secondary | ICD-10-CM

## 2023-10-14 NOTE — Therapy (Signed)
 OUTPATIENT PHYSICAL THERAPY PROSTHETIC TREATMENT  Patient Name: Susan [name] MRN: [id number] DOB:02-13-1951, 73 y.o., female Today's Date: 10/14/2023  PCP: Austine Lefort, MD  REFERRING PROVIDER:  Gearldean Keepers, MD  END OF SESSION:  PT End of Session - 10/14/23 1334     Visit Number 42    Number of Visits 60    Date for PT Re-Evaluation 12/03/23    Authorization Type [employer]    Progress Note Due on Visit 46    PT Start Time 1335    PT Stop Time 1415    PT Time Calculation (min) 40 min    Equipment Utilized During Treatment Gait belt    Activity Tolerance Patient tolerated treatment well;Patient limited by fatigue    Behavior During Therapy WFL for tasks assessed/performed                       Past Medical History:  Diagnosis Date   Anemia    hx   Anxiety    Arthritis    "generalized" (03/15/2014)   CAD (coronary artery disease)    MI in 2000 - MI  2007 - treated bare metal stent (no nuclear since then as 9/11)   Carotid artery disease (HCC)    Chronic diastolic heart failure (HCC)    a) ECHO (08/2013) EF 55-60% and RV function nl b) RHC (08/2013) RA 4, RV 30/5/7, PA 25/10 (16), PCWP 7, Fick CO/CI 6.3/2.7, PVR 1.5 WU, PA 61 and 66%   Daily headache    "~ every other day; since I fell in June" (03/15/2014)   Depression    Diabetic retinopathy (HCC)    Dyslipidemia    ESRD (end stage renal disease) (HCC)    Dialysis on Tues Thurs Sat   Exertional shortness of breath    GERD (gastroesophageal reflux disease)    History of blood transfusion    History of kidney stones    HTN (hypertension)    Hypothyroidism    Myocardial infarction (HCC)    Obesity    Osteoarthritis    PAF (paroxysmal atrial fibrillation) (HCC)    Peripheral neuropathy    bilateral feet/hands   PONV (postoperative nausea and vomiting)    RBBB (right bundle branch block)    Old   Stroke (HCC)    mini strokes   Type II diabetes mellitus (HCC)    Type II,  [name] libre left upper arm. patient has omnipod insulin  pump with Novolin R Insulin    Past Surgical History:  Procedure Laterality Date   A/V FISTULAGRAM Left 11/07/2022   Procedure: A/V Fistulagram;  Surgeon: Carlene Che, MD;  [facility]: [facility];  Service: Cardiovascular;  Laterality: Left;   ABDOMINAL HYSTERECTOMY  1980's   AMPUTATION Right 02/24/2018   Procedure: RIGHT FOOT GREAT TOE AND 2ND TOE AMPUTATION;  Surgeon: Timothy Ford, MD;  Location: [facility] OR;  Service: Orthopedics;  Laterality: Right;   AMPUTATION Right 04/30/2018   Procedure: RIGHT TRANSMETATARSAL AMPUTATION;  Surgeon: Timothy Ford, MD;  Location: [facility] OR;  Service: Orthopedics;  Laterality: Right;   AMPUTATION Right 05/02/2022   Procedure: RIGHT BELOW KNEE AMPUTATION;  Surgeon: Timothy Ford, MD;  Location: [facility] OR;  Service: Orthopedics;  Laterality: Right;   APPLICATION OF WOUND VAC Right 06/13/2022   Procedure: APPLICATION OF WOUND VAC;  Surgeon: Timothy Ford, MD;  [facility]: [facility] OR;  Service: Orthopedics;  Laterality: Right;   APPLICATION OF WOUND VAC Left  11/14/2022   Procedure: APPLICATION OF WOUND VAC;  Surgeon: Timothy Ford, MD;  Location: [facility] OR;  Service: Orthopedics;  Laterality: Left;   AV FISTULA PLACEMENT Left 04/02/2022   Procedure: LEFT ARM ARTERIOVENOUS (AV) FISTULA CREATION;  Surgeon: Margherita Shell, MD;  Location: [facility] OR;  Service: Vascular;  Laterality: Left;  PERIPHERAL NERVE BLOCK   AV FISTULA PLACEMENT Right 04/27/2023   Procedure: RIGHT ARM BRACHIOBASILIC ARTERIOVENOUS (AV) FISTULA CREATION;  Surgeon: Carlene Che, MD;  Location: [facility] OR;  Service: Vascular;  Laterality: Right;   BASCILIC VEIN TRANSPOSITION Left 07/31/2022   Procedure: LEFT ARM SECOND STAGE BASILIC VEIN TRANSPOSITION;  Surgeon: Margherita Shell, MD;  [facility]: [facility] OR;  Service: Vascular;  Laterality: Left;   BIOPSY  05/27/2020   Procedure: BIOPSY;  Surgeon: Vinetta Greening, DO;  Location: [facility];  Service: Endoscopy;;    CATARACT EXTRACTION, BILATERAL Bilateral ?2013   COLONOSCOPY W/ POLYPECTOMY     COLONOSCOPY WITH PROPOFOL  N/A 03/13/2019   Procedure: COLONOSCOPY WITH PROPOFOL ;  Surgeon: Nannette Babe, MD;  Location: [facility] ENDOSCOPY;  Service: Gastroenterology;  Laterality: N/A;   CORONARY ANGIOPLASTY WITH STENT PLACEMENT  1999; 2007   "1 + 1"   ERCP N/A 02/03/2022   Procedure: ENDOSCOPIC RETROGRADE CHOLANGIOPANCREATOGRAPHY (ERCP);  Surgeon: Alvis Jourdain, MD;  Location: [facility] ENDOSCOPY;  Service: Gastroenterology;  Laterality: N/A;   ESOPHAGOGASTRODUODENOSCOPY N/A 02/12/2023   Procedure: ESOPHAGOGASTRODUODENOSCOPY (EGD);  Surgeon: Alvis Jourdain, MD;  Location: [facility] ENDOSCOPY;  Service: Gastroenterology;  Laterality: N/A;   ESOPHAGOGASTRODUODENOSCOPY (EGD) WITH PROPOFOL  N/A 03/13/2019   Procedure: ESOPHAGOGASTRODUODENOSCOPY (EGD) WITH PROPOFOL ;  Surgeon: Nannette Babe, MD;  Location: [facility] ENDOSCOPY;  Service: Gastroenterology;  Laterality: N/A;   ESOPHAGOGASTRODUODENOSCOPY (EGD) WITH PROPOFOL  N/A 05/27/2020   Procedure: ESOPHAGOGASTRODUODENOSCOPY (EGD) WITH PROPOFOL ;  Surgeon: Vinetta Greening, DO;  [facility];  Service: Endoscopy;  Laterality: N/A;   ESOPHAGOGASTRODUODENOSCOPY (EGD) WITH PROPOFOL  N/A 09/03/2022   Procedure: ESOPHAGOGASTRODUODENOSCOPY (EGD) WITH PROPOFOL ;  Surgeon: Alvis Jourdain, MD;  Location: [facility] ENDOSCOPY;  Service: Gastroenterology;  Laterality: N/A;   EYE SURGERY Bilateral    lazer   FLEXIBLE SIGMOIDOSCOPY N/A 05/23/2022   Procedure: FLEXIBLE SIGMOIDOSCOPY;  Surgeon: Alvis Jourdain, MD;  Location: [facility] ENDOSCOPY;  Service: Gastroenterology;  Laterality: N/A;   FLEXIBLE SIGMOIDOSCOPY N/A 05/24/2022   Procedure: FLEXIBLE SIGMOIDOSCOPY;  Surgeon: Daina Drum, MD;  Location: [facility] ENDOSCOPY;  Service: Gastroenterology;  Laterality: N/A;   FLEXIBLE SIGMOIDOSCOPY N/A 09/03/2022   Procedure: FLEXIBLE SIGMOIDOSCOPY;  Surgeon: Alvis Jourdain, MD;  Location: [facility] ENDOSCOPY;  Service: Gastroenterology;   Laterality: N/A;   HEMOSTASIS CLIP PLACEMENT  03/13/2019   Procedure: HEMOSTASIS CLIP PLACEMENT;  Surgeon: Nannette Babe, MD;  Location: [facility] ENDOSCOPY;  Service: Gastroenterology;;   HEMOSTASIS CLIP PLACEMENT  05/23/2022   Procedure: HEMOSTASIS CLIP PLACEMENT;  Surgeon: Alvis Jourdain, MD;  Location: [facility] ENDOSCOPY;  Service: Gastroenterology;;   HEMOSTASIS CONTROL  05/24/2022   Procedure: HEMOSTASIS CONTROL;  Surgeon: Daina Drum, MD;  Location: [facility] ENDOSCOPY;  Service: Gastroenterology;;   HOT HEMOSTASIS N/A 05/23/2022   Procedure: HOT HEMOSTASIS (ARGON PLASMA COAGULATION/BICAP);  Surgeon: Alvis Jourdain, MD;  Location: [facility] ENDOSCOPY;  Service: Gastroenterology;  Laterality: N/A;   I & D EXTREMITY Left 05/05/2022   Procedure: IRRIGATION AND DEBRIDEMENT LEFT ARM AV FISTULA;  Surgeon: Young Hensen, MD;  Location: [facility] OR;  Service: Vascular;  Laterality: Left;   I & D EXTREMITY N/A 11/14/2022   Procedure: IRRIGATION AND DEBRIDEMENT OF LOWER EXTREMITY WOUND;  Surgeon:  Timothy Ford, MD;  Location: [facility] OR;  Service: Orthopedics;  Laterality: N/A;   INSERTION OF DIALYSIS CATHETER Right 04/02/2022   Procedure: INSERTION OF TUNNELED DIALYSIS CATHETER;  Surgeon: Margherita Shell, MD;  [facility] OR;  Service: Vascular;  Laterality: Right;   IR FLUORO GUIDE CV LINE RIGHT  08/10/2023   IR VENOCAVAGRAM IVC  08/10/2023   KNEE ARTHROSCOPY Left 10/25/2006   POLYPECTOMY  03/13/2019   Procedure: POLYPECTOMY;  Surgeon: Nannette Babe, MD;  Location: [facility] ENDOSCOPY;  Service: Gastroenterology;;   REMOVAL OF STONES  02/03/2022   Procedure: REMOVAL OF STONES;  Surgeon: Alvis Jourdain, MD;  Location: [facility] ENDOSCOPY;  Service: Gastroenterology;;   REVISON OF ARTERIOVENOUS FISTULA Left 08/20/2022   Procedure: REVISON OF LEFT ARM ARTERIOVENOUS FISTULA;  Surgeon: Margherita Shell, MD;  Location[facility] OR;  Service: Vascular;  Laterality: Left;   RIGHT HEART CATH N/A 07/24/2017   Procedure: RIGHT HEART CATH;  Surgeon:  Mardell Shade, MD;  [facility];  Service: Cardiovascular;  Laterality: N/A;   RIGHT HEART CATHETERIZATION N/A 09/22/2013   Procedure: RIGHT HEART CATH;  Surgeon: Mardell Shade, MD;  Location: [facility] CATH LAB;  Service: Cardiovascular;  Laterality: N/A;   SHOULDER ARTHROSCOPY WITH OPEN ROTATOR CUFF REPAIR Right 03/14/2014   Procedure: RIGHT SHOULDER ARTHROSCOPY WITH BICEPS RELEASE, OPEN SUBSCAPULA REPAIR, OPEN SUPRASPINATUS REPAIR.;  Surgeon: Jasmine Mesi, MD;  Location: [facility] OR;  Service: Orthopedics;  Laterality: Right;   SPHINCTEROTOMY  02/03/2022   Procedure: SPHINCTEROTOMY;  Surgeon: Alvis Jourdain, MD;  Location: [facility] ENDOSCOPY;  Service: Gastroenterology;;   STUMP REVISION Right 06/13/2022   Procedure: REVISION RIGHT BELOW KNEE AMPUTATION;  Surgeon: Timothy Ford, MD;  Location: [facility] OR;  Service: Orthopedics;  Laterality: Right;   TEE WITHOUT CARDIOVERSION N/A 02/04/2022   Procedure: TRANSESOPHAGEAL ECHOCARDIOGRAM (TEE);  Surgeon: Mardell Shade, MD;  Location: [facility] ENDOSCOPY;  Service: Cardiovascular;  Laterality: N/A;   THROMBECTOMY W/ EMBOLECTOMY Left 08/20/2022   Procedure: THROMBECTOMY OF LEFT ARM ARTERIOVENOUS FISTULA;  Surgeon: Margherita Shell, MD;  Location: [facility] OR;  Service: Vascular;  Laterality: Left;   TOE AMPUTATION Right 02/24/2018   GREAT TOE AND 2ND TOE AMPUTATION   TUBAL LIGATION  1970's   UPPER EXTREMITY VENOGRAPHY N/A 07/31/2023   Procedure: UPPER EXTREMITY VENOGRAPHY;  Surgeon: Carlene Che, MD;  Location: [facility];  Service: Cardiovascular;  Laterality: N/A;   Patient Active Problem List   Diagnosis Date Noted   UTI (urinary tract infection) 10/01/2023   Pyelonephritis 08/06/2023   Complicated UTI (urinary tract infection) 08/05/2023   Atrial fibrillation (HCC) 06/17/2023   Atopic dermatitis 06/17/2023   Hyperosmolar hyperglycemic state (HHS) (HCC) 02/05/2023   Gastroparesis 02/05/2023   Depression 01/29/2023   Intractable  nausea and vomiting 01/20/2023   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 difficile colitis 06/05/2022   Spleen hematoma 06/05/2022   Dehiscence of amputation stump of right lower extremity (HCC) 06/05/2022   Rectal ulcer 05/27/2022  ESRD (end stage renal disease) (HCC) 05/27/2022   GI bleed 05/23/2022   Difficult intravenous access 05/23/2022   Gangrene of right foot (HCC) 05/02/2022   S/P BKA (below knee amputation) unilateral, right (HCC) 05/02/2022   Unspecified protein-calorie malnutrition (HCC) 04/15/2022   Secondary hyperparathyroidism of renal origin (HCC) 04/14/2022   Coagulation defect, unspecified (HCC) 04/09/2022   Acquired absence of other left toe(s) (HCC) 04/07/2022   Allergy, unspecified, initial encounter 04/07/2022   Dependence on renal dialysis (HCC) 04/07/2022   Gout due to renal impairment, unspecified site 04/07/2022   Hypertensive heart and chronic kidney disease with heart failure and with stage 5 chronic kidney disease, or end stage renal disease (HCC) 04/07/2022   Personal history of transient ischemic attack (TIA), and cerebral infarction without residual deficits 04/07/2022   Renal osteodystrophy 04/07/2022   Venous stasis ulcer of right calf (HCC) 03/31/2022   Fistula, colovaginal 03/26/2022   Diarrhea 03/26/2022   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   Sepsis without acute organ dysfunction (HCC)     Bacteremia    Acute pancreatitis 02/01/2022   Abdominal pain 02/01/2022   SIRS (systemic inflammatory response syndrome) (HCC) 02/01/2022   Transaminitis 02/01/2022   History of anemia due to chronic kidney disease 02/01/2022   Paroxysmal atrial fibrillation (HCC) 02/01/2022   Uncontrolled type 2 diabetes mellitus with hyperglycemia, with long-term current use of insulin  (HCC) 01/14/2022   NSTEMI (non-ST elevated myocardial infarction) (HCC) 03/05/2021   Acute renal failure superimposed on stage 4 chronic kidney disease (HCC) 08/22/2020   Hypoalbuminemia 05/25/2020   GERD (gastroesophageal reflux disease) 05/25/2020   Pressure injury of skin 05/17/2020   Acute on chronic combined systolic and diastolic congestive heart failure (HCC) 03/07/2020   Type 2 diabetes mellitus with diabetic polyneuropathy, with long-term current use of insulin  (HCC) 03/07/2020   Obesity, Class III, BMI 40-49.9 (morbid obesity) 03/07/2020   Common bile duct (CBD) obstruction 05/28/2019   Benign neoplasm of ascending colon    Benign neoplasm of transverse colon    Benign neoplasm of descending colon    Benign neoplasm of sigmoid colon    Gastric polyps    Hyperkalemia 03/11/2019   Prolonged QT interval 03/11/2019   Acute blood loss anemia 03/11/2019   Onychomycosis 06/21/2018   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)    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 (HCC)    PVD (peripheral vascular disease) (HCC) 10/26/2017   E-coli UTI 07/27/2017   Hypothyroidism 07/27/2017   AKI (acute kidney injury) (HCC)    PAH (pulmonary artery hypertension) (HCC)    Impaired ambulation 07/19/2017   Leg cramps 02/27/2017   Peripheral edema 01/12/2017   Diabetic neuropathy (HCC) 11/12/2016   Anemia of chronic disease 10/03/2015   Historical diagnosis of generalized anxiety disorder 10/03/2015   Secondary insomnia 10/03/2015   Acute bronchitis 09/05/2015   Hyperglycemia due to diabetes mellitus  (HCC) 06/07/2015   Non compliance with medical treatment 04/17/2014   Rotator cuff tear 03/14/2014   Obesity 09/23/2013   Chronic HFrEF (heart failure with reduced ejection fraction) (HCC) 06/03/2013   Hypotension 12/25/2012   Hypokalemia 12/25/2012   Hyponatremia 12/25/2012   Urinary incontinence    MDD (major depressive disorder), recurrent episode, moderate (HCC) 11/12/2010   RBBB (right bundle branch block)    Wide-complex tachycardia    Coronary artery disease    Hyperlipemia 01/22/2009   Chronic hypotension 01/22/2009    ONSET DATE: 03/09/2023 MD referral to PT  REFERRING DIAG: A21.308M (ICD-10-CM) -  Below-knee amputation of right lower extremity, subsequent encounter   THERAPY DIAG:  Muscle weakness (generalized)  Other abnormalities of gait and mobility  Unsteadiness on feet  Impaired functional mobility, balance, gait, and endurance  Phantom limb syndrome with pain (HCC)  Rationale for Evaluation and Treatment: Rehabilitation  SUBJECTIVE:  SUBJECTIVE STATEMENT: She transferred to [facility] and had a BM this morning.   PERTINENT HISTORY: Right TTA 05/02/22, Lymphedema, idiopathic chronic venous HTN with ulcer, depression, colitis, diverticulitis, ESRD, gout, NSTEMI, DM2, polyneuropathy, PVD, CAD, right bundle branch block, mini strokes  PAIN:  Are you having pain?  Back pain today 8/10 upon arrival Neck pain 9/10 Right shoulder 9/10  Right shoulder today at rest 4-5/10 with moving 8-9/10;  pillow for support 5/10 (getting better sleep).   PRECAUTIONS: Fall and Other: No BP LUE  WEIGHT BEARING RESTRICTIONS: No  FALLS: Has patient fallen in last 6 months? No  LIVING ENVIRONMENT: Lives with: lives with their spouse and 2 small dogs Lives in: mobile home Home Access: Ramped entrance Home layout: One level Stairs: Yes: External: 6-7 steps; can reach both Has following equipment at home: Single point cane, Environmental consultant - 2 wheeled, Environmental consultant - 4 wheeled, Wheelchair  (manual), [employer], Grab bars, and Ramped entry  OCCUPATION:  retired  PLOF: prior to amputation for ~year used RW in home & community  PATIENT GOALS:  to use prosthesis to walk, get out w/c in & out of car, get to bathroom.   OBJECTIVE:  COGNITION: Overall cognitive status:  Eval on 03/23/2023:   Within functional limits for tasks assessed  POSTURE:  Eval on 03/23/2023:  rounded shoulders, forward head, increased thoracic kyphosis, flexed trunk , and weight shift left  LOWER EXTREMITY ROM: ROM Right eval Left eval  Hip flexion    Hip extension    Hip abduction    Hip adduction    Hip internal rotation    Hip external rotation    Knee flexion    Knee extension    Ankle dorsiflexion    Ankle plantarflexion    Ankle inversion    Ankle eversion     (Blank rows = not tested)  LOWER EXTREMITY MMT:  MMT Right eval Left eval Right 09/09/23 Left 09/09/23  Hip flexion 3-/5 3-/5 3/5 3/5  Hip extension 2/5 2/5 3-/5 3-/5  Hip abduction 2+/5 2+/5 3-/5 3-/5  Hip adduction      Hip internal rotation      Hip external rotation      Knee flexion 3-/5 3-/5 3/5 3/5  Knee extension 3-/5 3-/5 3/5 3/5  Ankle dorsiflexion      Ankle plantarflexion      Ankle inversion      Ankle eversion      At Evaluation all strength testing is grossly seated and functionally standing / gait. (Blank rows = not tested)  TRANSFERS: 09/07/2023: Scooting transfers with minA and sit to/from stand transfers with modA.   07/27/2023: -Sit>stand from wc with RW: MinA with heavy vc for sequencing and offweighting LE -Stand>sit wc with RW: CGA with patient performing safe controlled descent  06/15/2023: -Sit to stand transfers with RW wheelchair<>mat table at varied height: 19in with maxA to rise to stand; 22in ModA rise to stand, 24in min-modA rise to stand. -stand pivot transfer with RW w/c to mat table with modA once standing.  -Lateral scooting wc<>table at level height SBA; vc for LE positioning  for optimal use of LE during transfer -lateral scoot R: modA due  to fatigue and uneven surface transfer from low surface to higher surface   05/04/2023: Squat pivot transfers with modA.  04/21/2023: Pt sit to stand w/c to //bar with one hand pushing on w/c and other hand on //bar with modA first time & MinA 2nd/ 3rd time.  Best is RUE on //bar & LUE on w/c.  Stand to sit reaching LUE to w/c with minA to control descent.   PT demo & verbal cues on sit to/from stand w/c to RW.  1st time modA 2 person. 2nd time maxA one person.  Stand to sit with minA and constant cueing. Scooting transfers with minA with w/c & mat direct contact & same ht.   Eval on 03/23/2023:  Sit to stand: Max A (two-person assist) from 22" w/c to rolling walker Stand to sit: Max A (two-person assist) rolling walker to wheelchair  FUNCTIONAL TESTs:   09/09/2023: Patient able to tolerate static stance for 5 minutes with BUE support on RW with supervision.  This was current certification goal set so that patient can stand long enough for her husband to assist with cleaning her after toileting.  07/27/2023: Patient stood with RW SBA for 54sec before c/o lightheadedness and sitting down to toilet. Resisted nudges applied posteriorly to simulate hygienic cleaning. VC for weight shifts when LE become tired.  BP: after standing 47min54sec 161/82 HR 92  06/15/2023: -Static stance for standing with RW; requiring Mod-MaxA for rise to stand d/t patient unsteady on feet posterior lean without proper righting strategy, SBA for stand   04/21/2023: Pt able to stand 60 sec with BUE on //bars with supervision.  Pt able to stand for 1 min with RW support with minA.  Eval on 03/23/2023: Patient maintained upright holding rolling walker with mod assist 2 people for safety for 30 seconds.  GAIT: 09/09/2023: Patient ambulated 30' with RW including turning 90*to position to sit with mod assistance  07/27/2023: Patient ambulated  68ft with narrow bathroom entry and 90deg turn to sit on commode CGA with minA and heavy vc for progression and sequencing of RW. While turning to sit on commode patient lost balance laterally requiring mod-maxA from therapist and patient use of grab bar to regain balance.   06/17/2023: Patient ambulated 77ft (maximal tolerated distance) with RW ModA (2 person for safety). Required VC for LE sequencing and minA to steer walker and prevent tip over.   04/21/2023: Pt amb 5' in //bars with modA (2 person for safety) with PT verbal & tactile cues on sequence and weight shift.  Pt amb 10' with RW with maxA (2 person for safety) with PT verbal & tactile cues on sequence and weight shift.  Eval on 03/23/2023:  Patient took 2 steps (one per LE) with +2 max assist with rolling walker and TTA prosthesis.  Patient adducting prosthesis stepping on left foot with no awareness.  CURRENT PROSTHETIC WEAR ASSESSMENT: 09/07/2023: Patient and husband are independent with: skin check, prosthetic cleaning, ply sock cleaning, proper wear schedule/adjustment, residual limb care, correct ply sock adjustment, and proper weight-bearing schedule/adjustment Pt reports wear most of awake hours on dialysis and non-dialysis days.  She wears prosthesis to dialysis now to assist with transfers prn and is able to verbalize proper adjustment of prosthesis weight for weigh-in & weigh-out.   06/17/2023:  Independence with prosthetic care:  Patient and husband are independent with: skin check, prosthetic cleaning, ply sock cleaning, proper wear schedule/adjustment Patient is dependent with: residual limb care, correct ply  sock adjustment, and proper weight-bearing schedule/adjustment Donning prosthesis: husband is independent with proper donning. Patient requires MaxA. Patient can verbally direct someone how to properly assist. Doffing prosthesis: Husband is independent with proper donning. Patient requires modA. Patient can verbally  direct someone how to properly assist. Prosthetic wear tolerance: majority of awake hours including dialysis hours, 1x/day, 3-4 days/week Prosthetic weight bearing tolerance: able to tolerate of standing without complains of prosthetic limb pain. Time not limited by limb discomfort but limited by muscle fatigue.   Eval on 03/23/2023:   Patient is dependent with: skin check, residual limb care, prosthetic cleaning, ply sock cleaning, correct ply sock adjustment, proper wear schedule/adjustment, and proper weight-bearing schedule/adjustment Donning prosthesis: Max A Doffing prosthesis: Min A Prosthetic wear tolerance: 2-6 hours, 1x/day, 3-4 days/week Prosthetic weight bearing tolerance: 3 minutes with limb pain Edema: pitting edema Residual limb condition: No open areas, dry skin, normal color and temperature, cylindrical shape Prosthetic description: Silicone liner with pin lock suspension, total contact socket with flexible inner socket, sach foot    TODAY'S TREATMENT:                                                                                                             DATE: 10/14/2023 Therapeutic Activities with Transtibial Prosthesis: -PT educated pt & husband on dressing & cleaning recommendations with use of BSC.  They verbalized understanding.  -pt sit to stand w/c to RW with modA including finding stable position upon arising;  pt able to stand with CGA for 2 min; stand to sit with minA to control descent. -pt amb 5' with RW straight path with maxA.    Therapeutic Exercise: -seated AROM cervical rotation right/left, side bend right/left, flexion & extension 5 reps ea with deep breath end range. -seated AROM trunk rotation right/left, side bend right/left, flexion & extension 5 reps ea with deep breath end range. -PT educated pt & husband on need to continue with HEP for ROM & functional strength.  Sitting with no activity may result in decreased range & strength with  increased pain.  Pt & husband verbalized understanding.    TREATMENT:                                                                                                             DATE: 10/12/2023 Therapeutic Activities with Transtibial Prosthesis: - Pt able to perform scooting / small squat pivot transfer w/c to adjacent bariatric chair (similar to her BSC) with minA/CGA with cues on technique.  PT continues to recommend transferring to her [facility] with husband's assist daily to attempt  bowel movement.  Currently she has bowel movements in her diaper and husband cleans her as soon as able.  When they are out of house like medical appointments this may not be immediately.  PT educated pt & husband that this can be large contributing factor to her recurrent UTIs.   Self-Care: -PT demo & verbal cues on modifications to enable more independence in managing hair care by herself. PT recommended placing forehead on towel roll on table top and elbows table so she can get her hands to her own hair.  Pt able to return demo without issues with her shoulders especially RUE.    Therapeutic Exercise: AROM cervical rotation right/left, side bend right/left, flexion & extension 5 reps ea. Pt verbalized understanding to add to HEP while neck pain is an issue.     TREATMENT:                                                                                                             DATE: 09/23/2023 Prosthetic Training with Transtibial Prosthesis: - Pt sit to stand wheelchair to RW with minA.  Patient able to stand upright for 3 minutes with RW support with supervision.  Patient had controlled stand to sit without assistance.  Therapeutic exercise: - PT reprinted patient HEP HO.  PT reviewed HEP handout with 2 groups of exercises.  First group is for exercises to be performed while patient is in the bed: Bridge, lower body trunk rotation, heel slides with strap assist and hip Abd/adduction.  Second group is seated  exercises with patient sitting in wheelchair feet on the floor position at the edge of the wheelchair: Forward trunk lean to upright for back extension, leaning back with set up for abdominals, lateral trunk lean including how to protect right shoulder, trunk rotation, marching and stepping lower extremity out/back.  Patient performed 5 reps of all exercises with understanding PT is recommending 2 sets of 5 reps per HEP.  PT strongly discussed need to perform HEP daily on all days that she is not in dialysis and if tolerated after dialysis.  Patient and husband verbalized understanding of HEP.     PATIENT EDUCATION: PATIENT EDUCATED ON FOLLOWING PROSTHETIC CARE: Education details: Use of stockinette under proximal liner to decrease itching sensation, no wounds presents to PT recommended not using Vive wear under liner Skin check, Prosthetic cleaning, Propper donning, and Proper wear schedule/adjustment Prosthetic wear tolerance: 3 hours 2x/day, 4 days/week Person educated: Patient and Spouse Education method: Explanation, Tactile cues, and Verbal cues Education comprehension: verbalized understanding, verbal cues required, tactile cues required, and needs further education  HOME EXERCISE PROGRAM: Access Code: [email or web address] URL: [email or web address] Date: 09/23/2023 Prepared by: Lorie Rook  Exercises - Supine Bridge  - 1 x daily - 4 x weekly - 2 sets - 5 reps - 2 seconds hold - Supine Lower Trunk Rotation  - 1 x daily - 4 x weekly - 2 sets - 5 reps - 5 seconds hold - Supine Heel Slide with Strap  - 1 x daily -  4 x weekly - 2 sets - 5 reps - 2-3 seconds hold - Supine Hip Abduction  - 1 x daily - 4 x weekly - 2 sets - 5 reps - 2-3 seconds hold - Seated Hip Flexion Toward Target  - 1 x daily - 4 x weekly - 2 sets - 5 reps - 5 seconds hold - Seated Eccentric Abdominal Lean Back  - 1 x daily - 4 x weekly - 2 sets - 5 reps - 5 seconds hold - Seated Sidebending  - 1 x daily - 4 x weekly  - 2 sets - 5 reps - 5 seconds hold - Seated Trunk Rotation with Crossed Arms  - 1 x daily - 4 x weekly - 2 sets - 5 reps - 5 seconds hold - Seated March  - 1 x daily - 4 x weekly - 2 sets - 10 reps - 5 seconds hold - Seated Knee Flexion Extension AROM   - 1 x daily - 4 x weekly - 2 sets - 10 reps - 5 seconds hold    ASSESSMENT:  CLINICAL IMPRESSION: patient verbalizes understanding of PT recommendations for continuing HEP and transfers to [facility].  She has increased weakness in LEs from recent hospitalization so her gait was more limited.   Patient continues to benefit from skilled PT.  OBJECTIVE IMPAIRMENTS: Abnormal gait, decreased activity tolerance, decreased balance, decreased endurance, decreased knowledge of condition, decreased knowledge of use of DME, decreased mobility, difficulty walking, decreased ROM, decreased strength, decreased safety awareness, postural dysfunction, prosthetic dependency , and pain.   ACTIVITY LIMITATIONS: standing, transfers, and locomotion level  PARTICIPATION LIMITATIONS: community activity, household mobility and dependency / burden of care on family  PERSONAL FACTORS: Age, Fitness, Past/current experiences, Time since onset of injury/illness/exacerbation, and 3+ comorbidities: see PMH are also affecting patient's functional outcome.   REHAB POTENTIAL: Good  CLINICAL DECISION MAKING: Evolving/moderate complexity  EVALUATION COMPLEXITY: Moderate   GOALS: Goals reviewed with patient? Yes  SHORT TERM GOALS: Target date: 10/07/2023:  Patient & husband verbalized patient able to perform scooting transfer to / from [facility] and sit to/from stand [facility] to RW with husband's assistance safely. Baseline: SEE OBJECTIVE DATA Goal status:   MET  10/14/2023 2.  Patient able to sit to stand w/c to rolling walker with min assistance. Baseline: SEE OBJECTIVE DATA Goal status: NOT  MET  10/14/2023  3. Patient ambulates 51ft including turning 90* to position to sit with RW &  prosthesis with modA.  Baseline: NOT  MET  10/14/2023  UPDATED LONG TERM GOALS: Target date: 12/03/2023  Patient & husband continues to demonstrate & verbalize understanding of prosthetic care and wears prosthesis >80% of awake hours daily to enable safe utilization of prosthesis. Baseline: SEE OBJECTIVE DATA Goal status: Ongoing   10/05/2023  Patient and husband report patient is able to stand to enable cleaning after toileting safely with no issues Baseline: SEE OBJECTIVE DATA Goal status: Ongoing 10/05/2023  Patient able to perform sit to/from stand transfers with minA.  Baseline: SEE OBJECTIVE DATA Goal status: Ongoing  10/05/2023  Patient ambulates >49ft including turning 90* to position to sit with prosthesis & RW with minA with husband's assistance safely. Baseline: SEE OBJECTIVE DATA Goal status: Ongoing  10/05/2023  Patient understanding of ongoing HEP. Baseline: SEE OBJECTIVE DATA Goal status:  Ongoing  10/05/2023  PLAN:  PT FREQUENCY: 2x/week  PT DURATION: 12 weeks  PLANNED INTERVENTIONS: 97164- PT Re-evaluation, 97110-Therapeutic exercises, 97530- Therapeutic activity, [id number]- Neuromuscular re-education, 97535- Self  Care, 29562- Gait training, [phone]- Prosthetic training, Patient/Family education, Balance training, DME instructions, Therapeutic exercises, Therapeutic activity, Neuromuscular re-education, Gait training, and Self Care  PLAN FOR NEXT SESSION:     Check on compliance with HEP.  Continue with standing & gait activities.    Melda Mermelstein, PT, DPT 10/14/2023, 2:23 PM

## 2023-10-15 ENCOUNTER — Encounter: Payer: Self-pay | Admitting: Vascular Surgery

## 2023-10-15 ENCOUNTER — Ambulatory Visit: Admitting: Vascular Surgery

## 2023-10-15 VITALS — BP 142/68 | HR 75 | Temp 98.4°F | Ht 65.0 in | Wt 168.0 lb

## 2023-10-15 DIAGNOSIS — Z992 Dependence on renal dialysis: Secondary | ICD-10-CM

## 2023-10-15 DIAGNOSIS — N186 End stage renal disease: Secondary | ICD-10-CM

## 2023-10-15 NOTE — Progress Notes (Signed)
 HISTORY AND PHYSICAL     CC:  dialysis access Requesting Provider:  Austine Lefort, MD  HPI: This is a 73 y.o. female here for evaluation of her hemodialysis access.   She comes in today for follow up and here with her husband.  She was to follow up with Dr. Edgardo Goodwill to discuss BP and graft access.  She states that her blood pressure continues to be low.  At one point she was told to take 4 midodrine  before dialysis.  She states that she almost passed out at dialysis due to low blood pressure.  Her BP today is 100 systolic.    She has TDC that was placed 08/10/2023 by IR.  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 Julio Ohm.   Dialysis access history: -1st stage left BVT 03/28/2022 Dr. Charlotte Cookey -I&D left arm AVF 05/05/2022 Dr. Fulton Job -2nd stage left BVT 07/31/2022 Dr. Charlotte Cookey -thrombectomy and revision of left BVT 08/20/2022 Dr. Charlotte Cookey -fistulogram 11/07/2022 Dr. Edgardo Goodwill -right 1st stage BVT 04/27/2023 Dr. Edgardo Goodwill -BUE venogram 07/31/2023 Dr. Edgardo Goodwill   Pt is on dialysis.   Days of dialysis if applicable:  T/T/S    HD center:  [place]  location.   The pt is on a statin for cholesterol management.  The pt is not on a daily aspirin .  Other AC:  Eliquis  The pt is not on medication for hypertension.  The pt is  on medication for diabetes.   Tobacco hx:  former  10/15/23: Doing well.  Blood pressure continues to be an issue with dialysis.  Her blood pressure is better controlled today.  The patient reports at least 1 episode a week where her blood pressure dropped precipitously and she needs to be placed in Trendelenburg position.  She is recovering from recent admission for urinary tract infection.  She has significant shoulder and neck pain.  Past Medical History:  Diagnosis Date   Anemia    hx   Anxiety    Arthritis    "generalized" (03/15/2014)   CAD (coronary artery disease)    MI in 2000 - MI  2007 - treated bare metal stent (no nuclear since then as 9/11)   Carotid artery disease  (HCC)    Chronic diastolic heart failure (HCC)    a) ECHO (08/2013) EF 55-60% and RV function nl b) RHC (08/2013) RA 4, RV 30/5/7, PA 25/10 (16), PCWP 7, Fick CO/CI 6.3/2.7, PVR 1.5 WU, PA 61 and 66%   Daily headache    "~ every other day; since I fell in June" (03/15/2014)   Depression    Diabetic retinopathy (HCC)    Dyslipidemia    ESRD (end stage renal disease) (HCC)    Dialysis on Tues Thurs Sat   Exertional shortness of breath    GERD (gastroesophageal reflux disease)    History of blood transfusion    History of kidney stones    HTN (hypertension)    Hypothyroidism    Myocardial infarction (HCC)    Obesity    Osteoarthritis    PAF (paroxysmal atrial fibrillation) (HCC)    Peripheral neuropathy    bilateral feet/hands   PONV (postoperative nausea and vomiting)    RBBB (right bundle branch block)    Old   Stroke (HCC)    mini strokes   Type II diabetes mellitus (HCC)    Type II, [name] libre left upper arm. patient has omnipod insulin  pump with Novolin R Insulin     Past Surgical History:  Procedure Laterality  Date   A/V FISTULAGRAM Left 11/07/2022   Procedure: A/V Fistulagram;  Surgeon: Carlene Che, MD;  Location: [facility] INVASIVE CV LAB;  Service: Cardiovascular;  Laterality: Left;   ABDOMINAL HYSTERECTOMY  1980's   AMPUTATION Right 02/24/2018   Procedure: RIGHT FOOT GREAT TOE AND 2ND TOE AMPUTATION;  Surgeon: Timothy Ford, MD;  Location: [facility] OR;  Service: Orthopedics;  Laterality: Right;   AMPUTATION Right 04/30/2018   Procedure: RIGHT TRANSMETATARSAL AMPUTATION;  Surgeon: Timothy Ford, MD;  Location: [facility] OR;  Service: Orthopedics;  Laterality: Right;   AMPUTATION Right 05/02/2022   Procedure: RIGHT BELOW KNEE AMPUTATION;  Surgeon: Timothy Ford, MD;  Location[facility] OR;  Service: Orthopedics;  Laterality: Right;   APPLICATION OF WOUND VAC Right 06/13/2022   Procedure: APPLICATION OF WOUND VAC;  Surgeon: Timothy Ford, MD;  Location: [facility] OR;  Service: Orthopedics;   Laterality: Right;   APPLICATION OF WOUND VAC Left 11/14/2022   Procedure: APPLICATION OF WOUND VAC;  Surgeon: Timothy Ford, MD;  Location: [facility] OR;  Service: Orthopedics;  Laterality: Left;   AV FISTULA PLACEMENT Left 04/02/2022   Procedure: LEFT ARM ARTERIOVENOUS (AV) FISTULA CREATION;  Surgeon: Margherita Shell, MD;  Location: [facility] OR;  Service: Vascular;  Laterality: Left;  PERIPHERAL NERVE BLOCK   AV FISTULA PLACEMENT Right 04/27/2023   Procedure: RIGHT ARM BRACHIOBASILIC ARTERIOVENOUS (AV) FISTULA CREATION;  Surgeon: Carlene Che, MD;  Location: [facility] OR;  Service: Vascular;  Laterality: Right;   BASCILIC VEIN TRANSPOSITION Left 07/31/2022   Procedure: LEFT ARM SECOND STAGE BASILIC VEIN TRANSPOSITION;  Surgeon: Margherita Shell, MD;  Location: [facility] OR;  Service: Vascular;  Laterality: Left;   BIOPSY  05/27/2020   Procedure: BIOPSY;  Surgeon: Vinetta Greening, DO;  [facility]: [facility];  Service: Endoscopy;;   CATARACT EXTRACTION, BILATERAL Bilateral ?2013   COLONOSCOPY W/ POLYPECTOMY     COLONOSCOPY WITH PROPOFOL  N/A 03/13/2019   Procedure: COLONOSCOPY WITH PROPOFOL ;  Surgeon: Nannette Babe, MD;  Location: [facility] ENDOSCOPY;  Service: Gastroenterology;  Laterality: N/A;   CORONARY ANGIOPLASTY WITH STENT PLACEMENT  1999; 2007   "1 + 1"   ERCP N/A 02/03/2022   Procedure: ENDOSCOPIC RETROGRADE CHOLANGIOPANCREATOGRAPHY (ERCP);  Surgeon: Alvis Jourdain, MD;  Location: [facility] ENDOSCOPY;  Service: Gastroenterology;  Laterality: N/A;   ESOPHAGOGASTRODUODENOSCOPY N/A 02/12/2023   Procedure: ESOPHAGOGASTRODUODENOSCOPY (EGD);  Surgeon: Alvis Jourdain, MD;  Location: [facility] ENDOSCOPY;  Service: Gastroenterology;  Laterality: N/A;   ESOPHAGOGASTRODUODENOSCOPY (EGD) WITH PROPOFOL  N/A 03/13/2019   Procedure: ESOPHAGOGASTRODUODENOSCOPY (EGD) WITH PROPOFOL ;  Surgeon: Nannette Babe, MD;  Location: [facility] ENDOSCOPY;  Service: Gastroenterology;  Laterality: N/A;   ESOPHAGOGASTRODUODENOSCOPY (EGD) WITH PROPOFOL  N/A 05/27/2020    Procedure: ESOPHAGOGASTRODUODENOSCOPY (EGD) WITH PROPOFOL ;  Surgeon: Vinetta Greening, DO;  [facility];  Service: Endoscopy;  Laterality: N/A;   ESOPHAGOGASTRODUODENOSCOPY (EGD) WITH PROPOFOL  N/A 09/03/2022   Procedure: ESOPHAGOGASTRODUODENOSCOPY (EGD) WITH PROPOFOL ;  Surgeon: Alvis Jourdain, MD;  Location: [facility] ENDOSCOPY;  Service: Gastroenterology;  Laterality: N/A;   EYE SURGERY Bilateral    lazer   FLEXIBLE SIGMOIDOSCOPY N/A 05/23/2022   Procedure: FLEXIBLE SIGMOIDOSCOPY;  Surgeon: Alvis Jourdain, MD;  Location: [facility] ENDOSCOPY;  Service: Gastroenterology;  Laterality: N/A;   FLEXIBLE SIGMOIDOSCOPY N/A 05/24/2022   Procedure: FLEXIBLE SIGMOIDOSCOPY;  Surgeon: Daina Drum, MD;  Location: [facility] ENDOSCOPY;  Service: Gastroenterology;  Laterality: N/A;   FLEXIBLE SIGMOIDOSCOPY N/A 09/03/2022   Procedure: FLEXIBLE SIGMOIDOSCOPY;  Surgeon: Alvis Jourdain, MD;  Location: [facility] ENDOSCOPY;  Service: Gastroenterology;  Laterality: N/A;   HEMOSTASIS CLIP PLACEMENT  03/13/2019   Procedure: HEMOSTASIS CLIP PLACEMENT;  Surgeon: Nannette Babe, MD;  Location: [facility] ENDOSCOPY;  Service: Gastroenterology;;   HEMOSTASIS CLIP PLACEMENT  05/23/2022   Procedure: HEMOSTASIS CLIP PLACEMENT;  Surgeon: Alvis Jourdain, MD;  Location: [facility] ENDOSCOPY;  Service: Gastroenterology;;   HEMOSTASIS CONTROL  05/24/2022   Procedure: HEMOSTASIS CONTROL;  Surgeon: Daina Drum, MD;  Location: [facility] ENDOSCOPY;  Service: Gastroenterology;;   HOT HEMOSTASIS N/A 05/23/2022   Procedure: HOT HEMOSTASIS (ARGON PLASMA COAGULATION/BICAP);  Surgeon: Alvis Jourdain, MD;  Location: [facility] ENDOSCOPY;  Service: Gastroenterology;  Laterality: N/A;   I & D EXTREMITY Left 05/05/2022   Procedure: IRRIGATION AND DEBRIDEMENT LEFT ARM AV FISTULA;  Surgeon: Young Hensen, MD;  Location: [facility] OR;  Service: Vascular;  Laterality: Left;   I & D EXTREMITY N/A 11/14/2022   Procedure: IRRIGATION AND DEBRIDEMENT OF LOWER EXTREMITY WOUND;  Surgeon: Timothy Ford,  MD;  Location: [facility] OR;  Service: Orthopedics;  Laterality: N/A;   INSERTION OF DIALYSIS CATHETER Right 04/02/2022   Procedure: INSERTION OF TUNNELED DIALYSIS CATHETER;  Surgeon: Margherita Shell, MD;  Location: [facility] OR;  Service: Vascular;  Laterality: Right;   IR FLUORO GUIDE CV LINE RIGHT  08/10/2023   IR VENOCAVAGRAM IVC  08/10/2023   KNEE ARTHROSCOPY Left 10/25/2006   POLYPECTOMY  03/13/2019   Procedure: POLYPECTOMY;  Surgeon: Nannette Babe, MD;  Location: [facility] ENDOSCOPY;  Service: Gastroenterology;;   REMOVAL OF STONES  02/03/2022   Procedure: REMOVAL OF STONES;  Surgeon: Alvis Jourdain, MD;  Location: [facility] ENDOSCOPY;  Service: Gastroenterology;;   REVISON OF ARTERIOVENOUS FISTULA Left 08/20/2022   Procedure: REVISON OF LEFT ARM ARTERIOVENOUS FISTULA;  Surgeon: Margherita Shell, MD;  Location[facility] OR;  Service: Vascular;  Laterality: Left;   RIGHT HEART CATH N/A 07/24/2017   Procedure: RIGHT HEART CATH;  Surgeon: Mardell Shade, MD;  [facility] INVASIVE CV LAB;  Service: Cardiovascular;  Laterality: N/A;   RIGHT HEART CATHETERIZATION N/A 09/22/2013   Procedure: RIGHT HEART CATH;  Surgeon: Mardell Shade, MD;  Location: [facility] CATH LAB;  Service: Cardiovascular;  Laterality: N/A;   SHOULDER ARTHROSCOPY WITH OPEN ROTATOR CUFF REPAIR Right 03/14/2014   Procedure: RIGHT SHOULDER ARTHROSCOPY WITH BICEPS RELEASE, OPEN SUBSCAPULA REPAIR, OPEN SUPRASPINATUS REPAIR.;  Surgeon: Jasmine Mesi, MD;  Location: [facility] OR;  Service: Orthopedics;  Laterality: Right;   SPHINCTEROTOMY  02/03/2022   Procedure: SPHINCTEROTOMY;  Surgeon: Alvis Jourdain, MD;  Location: [facility] ENDOSCOPY;  Service: Gastroenterology;;   STUMP REVISION Right 06/13/2022   Procedure: REVISION RIGHT BELOW KNEE AMPUTATION;  Surgeon: Timothy Ford, MD;  Location: [facility] OR;  Service: Orthopedics;  Laterality: Right;   TEE WITHOUT CARDIOVERSION N/A 02/04/2022   Procedure: TRANSESOPHAGEAL ECHOCARDIOGRAM (TEE);  Surgeon: Mardell Shade, MD;  Location:  [facility] ENDOSCOPY;  Service: Cardiovascular;  Laterality: N/A;   THROMBECTOMY W/ EMBOLECTOMY Left 08/20/2022   Procedure: THROMBECTOMY OF LEFT ARM ARTERIOVENOUS FISTULA;  Surgeon: Margherita Shell, MD;  Location: [facility] OR;  Service: Vascular;  Laterality: Left;   TOE AMPUTATION Right 02/24/2018   GREAT TOE AND 2ND TOE AMPUTATION   TUBAL LIGATION  1970's   UPPER EXTREMITY VENOGRAPHY N/A 07/31/2023   Procedure: UPPER EXTREMITY VENOGRAPHY;  Surgeon: Carlene Che, MD;  Location: [facility] INVASIVE CV LAB;  Service: Cardiovascular;  Laterality: N/A;    Allergies  Allergen Reactions   Cephalexin  Diarrhea and Other (See Comments)   Codeine Nausea And Vomiting and Other (  See Comments)    Current Outpatient Medications  Medication Sig Dispense Refill   acetaminophen  (TYLENOL ) 325 MG tablet Take 2 tablets (650 mg total) by mouth every 6 (six) hours as needed for mild pain (or Fever >/= 101).     albuterol  (VENTOLIN  HFA) 108 (90 Base) MCG/ACT inhaler Inhale 1-2 puffs into the lungs every 6 (six) hours as needed for shortness of breath. 1 each 3   amiodarone  (PACERONE ) 200 MG tablet Take 1 tablet (200 mg total) by mouth daily. 90 tablet 3   apixaban  (ELIQUIS ) 5 MG TABS tablet Take 1 tablet (5 mg total) by mouth 2 (two) times daily. 180 tablet 1   ascorbic acid  (VITAMIN C ) 500 MG tablet Take 1 tablet (500 mg total) by mouth daily. 100 tablet 0   atorvastatin  (LIPITOR ) 80 MG tablet Take 1 tablet (80 mg total) by mouth daily. 90 tablet 1   calcitRIOL  (ROCALTROL ) 0.25 MCG capsule Take 9 capsules (2.25 mcg total) by mouth Every Tuesday,Thursday,and Saturday with dialysis. 30 capsule 0   clonazePAM  (KLONOPIN ) 0.5 MG disintegrating tablet Take 1 tablet (0.5 mg total) by mouth 2 (two) times daily. Stop lorazepam . 60 tablet 1   folic acid  (FOLVITE ) 1 MG tablet Take 1 mg by mouth daily.     folic acid -vitamin b complex-vitamin c -selenium-zinc  (DIALYVITE) 3 MG TABS tablet Take 1 tablet by mouth daily.     Insulin  Disposable  Pump (OMNIPOD 5 [id number] INTRO GEN 5) KIT Inject 200 Units into the skin 3 days. Using Novolog  for pod     levothyroxine  (SYNTHROID ) 50 MCG tablet Take 1 tablet (50 mcg total) by mouth daily before breakfast. 90 tablet 1   melatonin 3 MG TABS tablet Take 3 mg by mouth at bedtime.     methocarbamol  (ROBAXIN ) 750 MG tablet Take 1 tablet (750 mg total) by mouth every 6 (six) hours as needed for muscle spasms. 60 tablet 0   Methoxy PEG-Epoetin  Beta (MIRCERA IJ) Mircera     midodrine  (PROAMATINE ) 10 MG tablet Take 1-2 tablets (10-20 mg total) by mouth Every Tuesday,Thursday,and Saturday with dialysis. 30 tablet 1   nitroGLYCERIN  (NITROSTAT ) 0.4 MG SL tablet Place 1 tablet (0.4 mg total) under the tongue every 5 (five) minutes as needed. (Patient taking differently: Place 0.4 mg under the tongue every 5 (five) minutes as needed for chest pain.) 25 tablet 3   OXYGEN  Inhale 2 L into the lungs at bedtime.     pantoprazole  (PROTONIX ) 40 MG tablet Take 1 tablet (40 mg total) by mouth daily. 180 tablet 1   polyethylene glycol (MIRALAX  / GLYCOLAX ) 17 g packet Take 17 g by mouth daily. (Patient taking differently: Take 17 g by mouth daily as needed for mild constipation.)     sevelamer  carbonate (RENVELA ) 800 MG tablet Take 1,600 mg by mouth 3 (three) times daily with meals.     venlafaxine  XR (EFFEXOR -XR) 75 MG 24 hr capsule Take 1 capsule (75 mg total) by mouth daily with breakfast. Stop trintellix  90 capsule 1   Zinc  30 MG CAPS Take 30 mg by mouth daily.     No current facility-administered medications for this visit.    Family History  Problem Relation Age of Onset   Heart attack Mother 15    Social History   Socioeconomic History   Marital status: Married    Spouse name: Gaylin Ke   Number of children: 3   Years of education: 12th   Highest education level: Not on file  Occupational History  Employer: UNEMPLOYED  Tobacco Use   Smoking status: Former    Current packs/day: 0.00    Average  packs/day: 3.0 packs/day for 32.0 years (96.0 ttl pk-yrs)    Types: Cigarettes    Start date: 10/24/1965    Quit date: 10/24/1997    Years since quitting: 25.9   Smokeless tobacco: Never  Vaping Use   Vaping status: Never Used  Substance and Sexual Activity   Alcohol use: Not Currently   Drug use: No   Sexual activity: Not Currently    Birth control/protection: Surgical    Comment: Hysterectomy  Other Topics Concern   Not on file  Social History Narrative   Pt lives at home with her spouse.Caffeine Use- 3 sodas daily.    Social Drivers of Corporate investment banker Strain: Low Risk  (08/16/2021)   Overall Financial Resource Strain (CARDIA)    Difficulty of Paying Living Expenses: Not hard at all  Food Insecurity: No Food Insecurity (10/01/2023)   Hunger Vital Sign    Worried About Running Out of Food in the Last Year: Never true    Ran Out of Food in the Last Year: Never true  Transportation Needs: No Transportation Needs (10/01/2023)   PRAPARE - Administrator, Civil Service (Medical): No    Lack of Transportation (Non-Medical): No  Physical Activity: Inactive (08/16/2021)   Exercise Vital Sign    Days of Exercise per Week: 0 days    Minutes of Exercise per Session: 0 min  Stress: No Stress Concern Present (08/16/2021)   [employer] of Occupational Health - Occupational Stress Questionnaire    Feeling of Stress : Not at all  Social Connections: Socially Isolated (10/01/2023)   Social Connection and Isolation Panel [NHANES]    Frequency of Communication with Friends and Family: Once a week    Frequency of Social Gatherings with Friends and Family: Once a week    Attends Religious Services: Never    Database administrator or Organizations: No    Attends Banker Meetings: Never    Marital Status: Married  Catering manager Violence: Not At Risk (10/01/2023)   Humiliation, Afraid, Rape, and Kick questionnaire    Fear of Current or Ex-Partner: No     Emotionally Abused: No    Physically Abused: No    Sexually Abused: No    PHYSICAL EXAMINATION:  Today's Vitals   10/15/23 0858 10/15/23 0859  BP:  (!) 142/68  Pulse:  75  Temp:  98.4 F (36.9 C)  TempSrc:  Oral  SpO2:  99%  Weight:  168 lb (76.2 kg)  Height:  5\' 5"  (1.651 m)  PainSc: 9     Body mass index is 27.96 kg/m.  Chronically ill woman in no distress Regular rate and rhythm Unlabored breathing Right internal jugular tunneled dialysis catheter in place Palpable radial pulses bilaterally  ASSESSMENT/PLAN: 73 y.o. female with ESRD here for evaluation for hemodialysis access  Patient continues to suffer from intradialytic hypotension.  Midodrine  has not given her complete relief from severe bouts of symptomatic hypotension during dialysis.  I have recommended that she not undergo any permanent dialysis access until this is completely resolved.  We will see her again in 3 months to reevaluate things.   Heber Little. Edgardo Goodwill, MD [facility] Phone Number: [phone] 10/15/2023 9:03 AM

## 2023-10-16 ENCOUNTER — Ambulatory Visit: Admitting: Podiatry

## 2023-10-16 DIAGNOSIS — S88111D Complete traumatic amputation at level between knee and ankle, right lower leg, subsequent encounter: Secondary | ICD-10-CM

## 2023-10-16 DIAGNOSIS — E1151 Type 2 diabetes mellitus with diabetic peripheral angiopathy without gangrene: Secondary | ICD-10-CM

## 2023-10-16 DIAGNOSIS — M79675 Pain in left toe(s): Secondary | ICD-10-CM

## 2023-10-16 DIAGNOSIS — B351 Tinea unguium: Secondary | ICD-10-CM | POA: Diagnosis not present

## 2023-10-16 DIAGNOSIS — I5022 Chronic systolic (congestive) heart failure: Secondary | ICD-10-CM | POA: Diagnosis not present

## 2023-10-16 DIAGNOSIS — I2721 Secondary pulmonary arterial hypertension: Secondary | ICD-10-CM | POA: Diagnosis not present

## 2023-10-16 NOTE — Progress Notes (Signed)
 This patient returns to my office for at risk foot care.  This patient requires this care by a professional since this patient will be at risk due to having right leg amputation, ESRD, coagulation defect due to eliquis  and type 2 diabetes.  This patient is unable to cut nails herself since the patient cannot reach her nails.These nails are painful walking and wearing shoes.  This patient presents for at risk foot care today.  General Appearance  Alert, conversant and in no acute stress.  Vascular  Dorsalis pedis and posterior tibial  pulses areabsent  left.    Capillary return is diminished .  Cold feet.  Thin shiny skin.  Neurologic  Senn-Weinstein monofilament wire test diminished left. Muscle power within normal limits left  Nails Thick disfigured discolored nails with subungual debris  from hallux to fifth toes left.  No evidence of bacterial infection or drainage bilaterally.  Orthopedic  No limitations of motion  feet .  No crepitus or effusions noted.  No bony pathology or digital deformities noted.BK amputation.  Skin  normotropic skin with no porokeratosis noted bilaterally.  No signs of infections or ulcers noted.     Onychomycosis    Pain in left toes  Consent was obtained for treatment procedures.   Mechanical debridement of nails 1-5  bilaterally performed with a nail nipper.  Filed with dremel without incident.    Return office visit  3 months                    Told patient to return for periodic foot care and evaluation due to potential at risk complications.   Ruffin Cotton DPM

## 2023-10-20 DIAGNOSIS — N186 End stage renal disease: Secondary | ICD-10-CM | POA: Diagnosis not present

## 2023-10-20 DIAGNOSIS — D689 Coagulation defect, unspecified: Secondary | ICD-10-CM | POA: Diagnosis not present

## 2023-10-20 DIAGNOSIS — N2581 Secondary hyperparathyroidism of renal origin: Secondary | ICD-10-CM | POA: Diagnosis not present

## 2023-10-20 DIAGNOSIS — Z992 Dependence on renal dialysis: Secondary | ICD-10-CM | POA: Diagnosis not present

## 2023-10-20 DIAGNOSIS — D631 Anemia in chronic kidney disease: Secondary | ICD-10-CM | POA: Diagnosis not present

## 2023-10-21 ENCOUNTER — Ambulatory Visit: Admitting: Physical Therapy

## 2023-10-21 ENCOUNTER — Encounter: Payer: Self-pay | Admitting: Physical Therapy

## 2023-10-21 DIAGNOSIS — M6281 Muscle weakness (generalized): Secondary | ICD-10-CM | POA: Diagnosis not present

## 2023-10-21 DIAGNOSIS — R2689 Other abnormalities of gait and mobility: Secondary | ICD-10-CM

## 2023-10-21 DIAGNOSIS — Z7409 Other reduced mobility: Secondary | ICD-10-CM | POA: Diagnosis not present

## 2023-10-21 DIAGNOSIS — R2681 Unsteadiness on feet: Secondary | ICD-10-CM | POA: Diagnosis not present

## 2023-10-21 DIAGNOSIS — G546 Phantom limb syndrome with pain: Secondary | ICD-10-CM

## 2023-10-21 NOTE — Therapy (Signed)
 OUTPATIENT PHYSICAL THERAPY PROSTHETIC TREATMENT  Patient Name: Susan [name] MRN: [id number] DOB:03-01-1951, 73 y.o., female Today's Date: 10/21/2023  PCP: Austine Lefort, MD  REFERRING PROVIDER:  Gearldean Keepers, MD  END OF SESSION:  PT End of Session - 10/21/23 1345     Visit Number 43    Number of Visits 60    Date for PT Re-Evaluation 12/03/23    Authorization Type [employer]    Progress Note Due on Visit 46    PT Start Time 1343    PT Stop Time 1421    PT Time Calculation (min) 38 min    Equipment Utilized During Treatment Gait belt    Activity Tolerance Patient tolerated treatment well;Patient limited by fatigue    Behavior During Therapy WFL for tasks assessed/performed              Past Medical History:  Diagnosis Date   Anemia    hx   Anxiety    Arthritis    "generalized" (03/15/2014)   CAD (coronary artery disease)    MI in 2000 - MI  2007 - treated bare metal stent (no nuclear since then as 9/11)   Carotid artery disease (HCC)    Chronic diastolic heart failure (HCC)    a) ECHO (08/2013) EF 55-60% and RV function nl b) RHC (08/2013) RA 4, RV 30/5/7, PA 25/10 (16), PCWP 7, Fick CO/CI 6.3/2.7, PVR 1.5 WU, PA 61 and 66%   Daily headache    "~ every other day; since I fell in June" (03/15/2014)   Depression    Diabetic retinopathy (HCC)    Dyslipidemia    ESRD (end stage renal disease) (HCC)    Dialysis on Tues Thurs Sat   Exertional shortness of breath    GERD (gastroesophageal reflux disease)    History of blood transfusion    History of kidney stones    HTN (hypertension)    Hypothyroidism    Myocardial infarction (HCC)    Obesity    Osteoarthritis    PAF (paroxysmal atrial fibrillation) (HCC)    Peripheral neuropathy    bilateral feet/hands   PONV (postoperative nausea and vomiting)    RBBB (right bundle branch block)    Old   Stroke (HCC)    mini strokes   Type II diabetes mellitus (HCC)    Type II, [name] libre left  upper arm. patient has omnipod insulin  pump with Novolin R Insulin    Past Surgical History:  Procedure Laterality Date   A/V FISTULAGRAM Left 11/07/2022   Procedure: A/V Fistulagram;  Surgeon: Carlene Che, MD;  [facility]: [facility];  Service: Cardiovascular;  Laterality: Left;   ABDOMINAL HYSTERECTOMY  1980's   AMPUTATION Right 02/24/2018   Procedure: RIGHT FOOT GREAT TOE AND 2ND TOE AMPUTATION;  Surgeon: Timothy Ford, MD;  Location: [facility] OR;  Service: Orthopedics;  Laterality: Right;   AMPUTATION Right 04/30/2018   Procedure: RIGHT TRANSMETATARSAL AMPUTATION;  Surgeon: Timothy Ford, MD;  Location: [facility] OR;  Service: Orthopedics;  Laterality: Right;   AMPUTATION Right 05/02/2022   Procedure: RIGHT BELOW KNEE AMPUTATION;  Surgeon: Timothy Ford, MD;  Location: [facility] OR;  Service: Orthopedics;  Laterality: Right;   APPLICATION OF WOUND VAC Right 06/13/2022   Procedure: APPLICATION OF WOUND VAC;  Surgeon: Timothy Ford, MD;  Location: [facility] OR;  Service: Orthopedics;  Laterality: Right;   APPLICATION OF WOUND VAC Left 11/14/2022   Procedure: APPLICATION OF WOUND VAC;  Surgeon: Timothy Ford, MD;  Location: [facility] OR;  Service: Orthopedics;  Laterality: Left;   AV FISTULA PLACEMENT Left 04/02/2022   Procedure: LEFT ARM ARTERIOVENOUS (AV) FISTULA CREATION;  Surgeon: Margherita Shell, MD;  Location: [facility] OR;  Service: Vascular;  Laterality: Left;  PERIPHERAL NERVE BLOCK   AV FISTULA PLACEMENT Right 04/27/2023   Procedure: RIGHT ARM BRACHIOBASILIC ARTERIOVENOUS (AV) FISTULA CREATION;  Surgeon: Carlene Che, MD;  [facility]: [facility] OR;  Service: Vascular;  Laterality: Right;   BASCILIC VEIN TRANSPOSITION Left 07/31/2022   Procedure: LEFT ARM SECOND STAGE BASILIC VEIN TRANSPOSITION;  Surgeon: Margherita Shell, MD;  [facility]: [facility] OR;  Service: Vascular;  Laterality: Left;   BIOPSY  05/27/2020   Procedure: BIOPSY;  Surgeon: Vinetta Greening, DO;  Location: [facility];  Service: Endoscopy;;   CATARACT  EXTRACTION, BILATERAL Bilateral ?2013   COLONOSCOPY W/ POLYPECTOMY     COLONOSCOPY WITH PROPOFOL  N/A 03/13/2019   Procedure: COLONOSCOPY WITH PROPOFOL ;  Surgeon: Nannette Babe, MD;  Location[facility] ENDOSCOPY;  Service: Gastroenterology;  Laterality: N/A;   CORONARY ANGIOPLASTY WITH STENT PLACEMENT  1999; 2007   "1 + 1"   ERCP N/A 02/03/2022   Procedure: ENDOSCOPIC RETROGRADE CHOLANGIOPANCREATOGRAPHY (ERCP);  Surgeon: Alvis Jourdain, MD;  Location: [facility] ENDOSCOPY;  Service: Gastroenterology;  Laterality: N/A;   ESOPHAGOGASTRODUODENOSCOPY N/A 02/12/2023   Procedure: ESOPHAGOGASTRODUODENOSCOPY (EGD);  Surgeon: Alvis Jourdain, MD;  Location: [facility] ENDOSCOPY;  Service: Gastroenterology;  Laterality: N/A;   ESOPHAGOGASTRODUODENOSCOPY (EGD) WITH PROPOFOL  N/A 03/13/2019   Procedure: ESOPHAGOGASTRODUODENOSCOPY (EGD) WITH PROPOFOL ;  Surgeon: Nannette Babe, MD;  Location: [facility] ENDOSCOPY;  Service: Gastroenterology;  Laterality: N/A;   ESOPHAGOGASTRODUODENOSCOPY (EGD) WITH PROPOFOL  N/A 05/27/2020   Procedure: ESOPHAGOGASTRODUODENOSCOPY (EGD) WITH PROPOFOL ;  Surgeon: Vinetta Greening, DO;  [facility];  Service: Endoscopy;  Laterality: N/A;   ESOPHAGOGASTRODUODENOSCOPY (EGD) WITH PROPOFOL  N/A 09/03/2022   Procedure: ESOPHAGOGASTRODUODENOSCOPY (EGD) WITH PROPOFOL ;  Surgeon: Alvis Jourdain, MD;  Location: [facility] ENDOSCOPY;  Service: Gastroenterology;  Laterality: N/A;   EYE SURGERY Bilateral    lazer   FLEXIBLE SIGMOIDOSCOPY N/A 05/23/2022   Procedure: FLEXIBLE SIGMOIDOSCOPY;  Surgeon: Alvis Jourdain, MD;  Location: [facility] ENDOSCOPY;  Service: Gastroenterology;  Laterality: N/A;   FLEXIBLE SIGMOIDOSCOPY N/A 05/24/2022   Procedure: FLEXIBLE SIGMOIDOSCOPY;  Surgeon: Daina Drum, MD;  Location: [facility] ENDOSCOPY;  Service: Gastroenterology;  Laterality: N/A;   FLEXIBLE SIGMOIDOSCOPY N/A 09/03/2022   Procedure: FLEXIBLE SIGMOIDOSCOPY;  Surgeon: Alvis Jourdain, MD;  Location: [name] ENDOSCOPY;  Service: Gastroenterology;  Laterality:  N/A;   HEMOSTASIS CLIP PLACEMENT  03/13/2019   Procedure: HEMOSTASIS CLIP PLACEMENT;  Surgeon: Nannette Babe, MD;  Location: [facility] ENDOSCOPY;  Service: Gastroenterology;;   HEMOSTASIS CLIP PLACEMENT  05/23/2022   Procedure: HEMOSTASIS CLIP PLACEMENT;  Surgeon: Alvis Jourdain, MD;  Location: [facility] ENDOSCOPY;  Service: Gastroenterology;;   HEMOSTASIS CONTROL  05/24/2022   Procedure: HEMOSTASIS CONTROL;  Surgeon: Daina Drum, MD;  Location: [facility] ENDOSCOPY;  Service: Gastroenterology;;   HOT HEMOSTASIS N/A 05/23/2022   Procedure: HOT HEMOSTASIS (ARGON PLASMA COAGULATION/BICAP);  Surgeon: Alvis Jourdain, MD;  Location: [facility] ENDOSCOPY;  Service: Gastroenterology;  Laterality: N/A;   I & D EXTREMITY Left 05/05/2022   Procedure: IRRIGATION AND DEBRIDEMENT LEFT ARM AV FISTULA;  Surgeon: Young Hensen, MD;  Location: [facility] OR;  Service: Vascular;  Laterality: Left;   I & D EXTREMITY N/A 11/14/2022   Procedure: IRRIGATION AND DEBRIDEMENT OF LOWER EXTREMITY WOUND;  Surgeon: Timothy Ford, MD;  [facility]: [facility] OR;  Service: Orthopedics;  Laterality: N/A;   INSERTION OF DIALYSIS CATHETER Right 04/02/2022   Procedure: INSERTION OF TUNNELED DIALYSIS CATHETER;  Surgeon: Margherita Shell, MD;  Location: [facility] OR;  Service: Vascular;  Laterality: Right;   IR FLUORO GUIDE CV LINE RIGHT  08/10/2023   IR VENOCAVAGRAM IVC  08/10/2023   KNEE ARTHROSCOPY Left 10/25/2006   POLYPECTOMY  03/13/2019   Procedure: POLYPECTOMY;  Surgeon: Nannette Babe, MD;  Location: [facility] ENDOSCOPY;  Service: Gastroenterology;;   REMOVAL OF STONES  02/03/2022   Procedure: REMOVAL OF STONES;  Surgeon: Alvis Jourdain, MD;  Location: [facility] ENDOSCOPY;  Service: Gastroenterology;;   REVISON OF ARTERIOVENOUS FISTULA Left 08/20/2022   Procedure: REVISON OF LEFT ARM ARTERIOVENOUS FISTULA;  Surgeon: Margherita Shell, MD;  [facility] OR;  Service: Vascular;  Laterality: Left;   RIGHT HEART CATH N/A 07/24/2017   Procedure: RIGHT HEART CATH;  Surgeon: Mardell Shade,  MD;  [facility];  Service: Cardiovascular;  Laterality: N/A;   RIGHT HEART CATHETERIZATION N/A 09/22/2013   Procedure: RIGHT HEART CATH;  Surgeon: Mardell Shade, MD;  Location: [facility] CATH LAB;  Service: Cardiovascular;  Laterality: N/A;   SHOULDER ARTHROSCOPY WITH OPEN ROTATOR CUFF REPAIR Right 03/14/2014   Procedure: RIGHT SHOULDER ARTHROSCOPY WITH BICEPS RELEASE, OPEN SUBSCAPULA REPAIR, OPEN SUPRASPINATUS REPAIR.;  Surgeon: Jasmine Mesi, MD;  Location: [facility] OR;  Service: Orthopedics;  Laterality: Right;   SPHINCTEROTOMY  02/03/2022   Procedure: SPHINCTEROTOMY;  Surgeon: Alvis Jourdain, MD;  Location: [facility] ENDOSCOPY;  Service: Gastroenterology;;   STUMP REVISION Right 06/13/2022   Procedure: REVISION RIGHT BELOW KNEE AMPUTATION;  Surgeon: Timothy Ford, MD;  Location: [facility] OR;  Service: Orthopedics;  Laterality: Right;   TEE WITHOUT CARDIOVERSION N/A 02/04/2022   Procedure: TRANSESOPHAGEAL ECHOCARDIOGRAM (TEE);  Surgeon: Mardell Shade, MD;  Location: [facility] ENDOSCOPY;  Service: Cardiovascular;  Laterality: N/A;   THROMBECTOMY W/ EMBOLECTOMY Left 08/20/2022   Procedure: THROMBECTOMY OF LEFT ARM ARTERIOVENOUS FISTULA;  Surgeon: Margherita Shell, MD;  Location: [facility] OR;  Service: Vascular;  Laterality: Left;   TOE AMPUTATION Right 02/24/2018   GREAT TOE AND 2ND TOE AMPUTATION   TUBAL LIGATION  1970's   UPPER EXTREMITY VENOGRAPHY N/A 07/31/2023   Procedure: UPPER EXTREMITY VENOGRAPHY;  Surgeon: Carlene Che, MD;  [facility]: [facility];  Service: Cardiovascular;  Laterality: N/A;   Patient Active Problem List   Diagnosis Date Noted   UTI (urinary tract infection) 10/01/2023   Pyelonephritis 08/06/2023   Complicated UTI (urinary tract infection) 08/05/2023   Atrial fibrillation (HCC) 06/17/2023   Atopic dermatitis 06/17/2023   Hyperosmolar hyperglycemic state (HHS) (HCC) 02/05/2023   Gastroparesis 02/05/2023   Depression 01/29/2023   Intractable nausea and vomiting  01/20/2023   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 difficile colitis 06/05/2022   Spleen hematoma 06/05/2022   Dehiscence of amputation stump of right lower extremity (HCC) 06/05/2022   Rectal ulcer 05/27/2022   ESRD (end stage renal disease) (HCC) 05/27/2022  GI bleed 05/23/2022   Difficult intravenous access 05/23/2022   Gangrene of right foot (HCC) 05/02/2022   S/P BKA (below knee amputation) unilateral, right (HCC) 05/02/2022   Unspecified protein-calorie malnutrition (HCC) 04/15/2022   Secondary hyperparathyroidism of renal origin (HCC) 04/14/2022   Coagulation defect, unspecified (HCC) 04/09/2022   Acquired absence of other left toe(s) (HCC) 04/07/2022   Allergy, unspecified, initial encounter 04/07/2022   Dependence on renal dialysis (HCC) 04/07/2022   Gout due to renal impairment, unspecified site 04/07/2022   Hypertensive heart and chronic kidney disease with heart failure and with stage 5 chronic kidney disease, or end stage renal disease (HCC) 04/07/2022   Personal history of transient ischemic attack (TIA), and cerebral infarction without residual deficits 04/07/2022   Renal osteodystrophy 04/07/2022   Venous stasis ulcer of right calf (HCC) 03/31/2022   Fistula, colovaginal 03/26/2022   Diarrhea 03/26/2022   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   Sepsis without acute organ dysfunction (HCC)    Bacteremia    Acute  pancreatitis 02/01/2022   Abdominal pain 02/01/2022   SIRS (systemic inflammatory response syndrome) (HCC) 02/01/2022   Transaminitis 02/01/2022   History of anemia due to chronic kidney disease 02/01/2022   Paroxysmal atrial fibrillation (HCC) 02/01/2022   Uncontrolled type 2 diabetes mellitus with hyperglycemia, with long-term current use of insulin  (HCC) 01/14/2022   NSTEMI (non-ST elevated myocardial infarction) (HCC) 03/05/2021   Acute renal failure superimposed on stage 4 chronic kidney disease (HCC) 08/22/2020   Hypoalbuminemia 05/25/2020   GERD (gastroesophageal reflux disease) 05/25/2020   Pressure injury of skin 05/17/2020   Acute on chronic combined systolic and diastolic congestive heart failure (HCC) 03/07/2020   Type 2 diabetes mellitus with diabetic polyneuropathy, with long-term current use of insulin  (HCC) 03/07/2020   Obesity, Class III, BMI 40-49.9 (morbid obesity) 03/07/2020   Common bile duct (CBD) obstruction 05/28/2019   Benign neoplasm of ascending colon    Benign neoplasm of transverse colon    Benign neoplasm of descending colon    Benign neoplasm of sigmoid colon    Gastric polyps    Hyperkalemia 03/11/2019   Prolonged QT interval 03/11/2019   Acute blood loss anemia 03/11/2019   Onychomycosis 06/21/2018   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)    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 (HCC)    PVD (peripheral vascular disease) (HCC) 10/26/2017   E-coli UTI 07/27/2017   Hypothyroidism 07/27/2017   AKI (acute kidney injury) (HCC)    PAH (pulmonary artery hypertension) (HCC)    Impaired ambulation 07/19/2017   Leg cramps 02/27/2017   Peripheral edema 01/12/2017   Diabetic neuropathy (HCC) 11/12/2016   Anemia of chronic disease 10/03/2015   Historical diagnosis of generalized anxiety disorder 10/03/2015   Secondary insomnia 10/03/2015   Acute bronchitis 09/05/2015   Hyperglycemia due to diabetes mellitus (HCC) 06/07/2015    Non compliance with medical treatment 04/17/2014   Rotator cuff tear 03/14/2014   Obesity 09/23/2013   Chronic HFrEF (heart failure with reduced ejection fraction) (HCC) 06/03/2013   Hypotension 12/25/2012   Hypokalemia 12/25/2012   Hyponatremia 12/25/2012   Urinary incontinence    MDD (major depressive disorder), recurrent episode, moderate (HCC) 11/12/2010   RBBB (right bundle branch block)    Wide-complex tachycardia    Coronary artery disease    Hyperlipemia 01/22/2009   Chronic hypotension 01/22/2009    ONSET DATE: 03/09/2023 MD referral to PT  REFERRING DIAG: S88.111D (ICD-10-CM) - Below-knee amputation of right lower extremity, subsequent encounter  THERAPY DIAG:  Muscle weakness (generalized)  Other abnormalities of gait and mobility  Unsteadiness on feet  Impaired functional mobility, balance, gait, and endurance  Phantom limb syndrome with pain (HCC)  Rationale for Evaluation and Treatment: Rehabilitation  SUBJECTIVE:  SUBJECTIVE STATEMENT: She is transferring to [facility] scooting transfer.  They tried one stand pivot transfer to regular toilet but legs were too weak.  She has been going the neck & back exercises.   PERTINENT HISTORY: Right TTA 05/02/22, Lymphedema, idiopathic chronic venous HTN with ulcer, depression, colitis, diverticulitis, ESRD, gout, NSTEMI, DM2, polyneuropathy, PVD, CAD, right bundle branch block, mini strokes  PAIN:  Are you having pain?  Back pain today  8-9/10 Neck pain 8/10 Right shoulder 8/10   PRECAUTIONS: Fall and Other: No BP LUE  WEIGHT BEARING RESTRICTIONS: No  FALLS: Has patient fallen in last 6 months? No  LIVING ENVIRONMENT: Lives with: lives with their spouse and 2 small dogs Lives in: mobile home Home Access: Ramped entrance Home layout: One level Stairs: Yes: External: 6-7 steps; can reach both Has following equipment at home: Single point cane, Environmental consultant - 2 wheeled, Environmental consultant - 4 wheeled, Wheelchair (manual), [employer], Grab bars, and Ramped entry  OCCUPATION:  retired  PLOF: prior to amputation for ~year used RW in home & community  PATIENT GOALS:  to use prosthesis to walk, get out w/c in & out of car, get to bathroom.   OBJECTIVE:  COGNITION: Overall cognitive status:  Eval on 03/23/2023:   Within functional limits for tasks assessed  POSTURE:  Eval on 03/23/2023:  rounded shoulders, forward head, increased thoracic kyphosis, flexed trunk , and weight shift left  LOWER EXTREMITY ROM: ROM Right eval Left eval  Hip flexion    Hip extension    Hip abduction    Hip adduction    Hip internal rotation    Hip external rotation    Knee flexion    Knee extension    Ankle dorsiflexion    Ankle plantarflexion    Ankle inversion    Ankle eversion     (Blank rows = not tested)  LOWER EXTREMITY MMT:  MMT Right eval Left eval Right 09/09/23 Left 09/09/23  Hip flexion 3-/5 3-/5 3/5 3/5  Hip extension 2/5 2/5 3-/5 3-/5  Hip abduction 2+/5 2+/5 3-/5 3-/5  Hip adduction      Hip internal rotation      Hip external rotation      Knee flexion 3-/5 3-/5 3/5 3/5  Knee extension 3-/5 3-/5 3/5 3/5  Ankle dorsiflexion      Ankle plantarflexion      Ankle inversion      Ankle eversion      At Evaluation all strength testing is grossly seated and functionally standing / gait. (Blank rows = not tested)  TRANSFERS: 09/07/2023: Scooting transfers with minA and sit to/from stand transfers with modA.   07/27/2023: -Sit>stand from wc with RW: MinA with heavy vc for sequencing and offweighting LE -Stand>sit wc with RW: CGA with patient performing safe controlled descent  06/15/2023: -Sit to stand transfers with RW wheelchair<>mat table at varied height: 19in with maxA to rise to stand; 22in ModA rise to stand, 24in min-modA rise to stand. -stand pivot transfer with RW w/c to mat table with modA once standing.  -Lateral scooting wc<>table at level height SBA; vc for LE positioning for optimal use of  LE during transfer -lateral scoot R: modA due to fatigue and uneven surface transfer from  low surface to higher surface   05/04/2023: Squat pivot transfers with modA.  04/21/2023: Pt sit to stand w/c to //bar with one hand pushing on w/c and other hand on //bar with modA first time & MinA 2nd/ 3rd time.  Best is RUE on //bar & LUE on w/c.  Stand to sit reaching LUE to w/c with minA to control descent.   PT demo & verbal cues on sit to/from stand w/c to RW.  1st time modA 2 person. 2nd time maxA one person.  Stand to sit with minA and constant cueing. Scooting transfers with minA with w/c & mat direct contact & same ht.   Eval on 03/23/2023:  Sit to stand: Max A (two-person assist) from 22" w/c to rolling walker Stand to sit: Max A (two-person assist) rolling walker to wheelchair  FUNCTIONAL TESTs:   09/09/2023: Patient able to tolerate static stance for 5 minutes with BUE support on RW with supervision.  This was current certification goal set so that patient can stand long enough for her husband to assist with cleaning her after toileting.  07/27/2023: Patient stood with RW SBA for 54sec before c/o lightheadedness and sitting down to toilet. Resisted nudges applied posteriorly to simulate hygienic cleaning. VC for weight shifts when LE become tired.  BP: after standing 20min54sec 161/82 HR 92  06/15/2023: -Static stance for standing with RW; requiring Mod-MaxA for rise to stand d/t patient unsteady on feet posterior lean without proper righting strategy, SBA for stand   04/21/2023: Pt able to stand 60 sec with BUE on //bars with supervision.  Pt able to stand for 1 min with RW support with minA.  Eval on 03/23/2023: Patient maintained upright holding rolling walker with mod assist 2 people for safety for 30 seconds.  GAIT: 09/09/2023: Patient ambulated 30' with RW including turning 90*to position to sit with mod assistance  07/27/2023: Patient ambulated 8ft with narrow  bathroom entry and 90deg turn to sit on commode CGA with minA and heavy vc for progression and sequencing of RW. While turning to sit on commode patient lost balance laterally requiring mod-maxA from therapist and patient use of grab bar to regain balance.   06/17/2023: Patient ambulated 45ft (maximal tolerated distance) with RW ModA (2 person for safety). Required VC for LE sequencing and minA to steer walker and prevent tip over.   04/21/2023: Pt amb 5' in //bars with modA (2 person for safety) with PT verbal & tactile cues on sequence and weight shift.  Pt amb 10' with RW with maxA (2 person for safety) with PT verbal & tactile cues on sequence and weight shift.  Eval on 03/23/2023:  Patient took 2 steps (one per LE) with +2 max assist with rolling walker and TTA prosthesis.  Patient adducting prosthesis stepping on left foot with no awareness.  CURRENT PROSTHETIC WEAR ASSESSMENT: 09/07/2023: Patient and husband are independent with: skin check, prosthetic cleaning, ply sock cleaning, proper wear schedule/adjustment, residual limb care, correct ply sock adjustment, and proper weight-bearing schedule/adjustment Pt reports wear most of awake hours on dialysis and non-dialysis days.  She wears prosthesis to dialysis now to assist with transfers prn and is able to verbalize proper adjustment of prosthesis weight for weigh-in & weigh-out.   06/17/2023:  Independence with prosthetic care:  Patient and husband are independent with: skin check, prosthetic cleaning, ply sock cleaning, proper wear schedule/adjustment Patient is dependent with: residual limb care, correct ply sock adjustment, and proper weight-bearing schedule/adjustment Donning  prosthesis: husband is independent with proper donning. Patient requires MaxA. Patient can verbally direct someone how to properly assist. Doffing prosthesis: Husband is independent with proper donning. Patient requires modA. Patient can verbally direct someone how  to properly assist. Prosthetic wear tolerance: majority of awake hours including dialysis hours, 1x/day, 3-4 days/week Prosthetic weight bearing tolerance: able to tolerate of standing without complains of prosthetic limb pain. Time not limited by limb discomfort but limited by muscle fatigue.   Eval on 03/23/2023:   Patient is dependent with: skin check, residual limb care, prosthetic cleaning, ply sock cleaning, correct ply sock adjustment, proper wear schedule/adjustment, and proper weight-bearing schedule/adjustment Donning prosthesis: Max A Doffing prosthesis: Min A Prosthetic wear tolerance: 2-6 hours, 1x/day, 3-4 days/week Prosthetic weight bearing tolerance: 3 minutes with limb pain Edema: pitting edema Residual limb condition: No open areas, dry skin, normal color and temperature, cylindrical shape Prosthetic description: Silicone liner with pin lock suspension, total contact socket with flexible inner socket, sach foot    TODAY'S TREATMENT:                                                                                                             DATE: 10/21/2023 Therapeutic Activities with Transtibial Prosthesis: -PT educated pt & husband on need to consistently try BSC for BMs.  Patient continues to have issues with having bowel movements in her Depends.  PT discussed how this increases risk of another UTI that could require hospitalization.  PT is recommending daily including on dialysis days that she attempt to have a bowel movement on the bedside commode by transferring there and sitting for at least 10 minutes.  Patient verbalizes understanding but then makes comments that makes PT question whether is going to be compliance. -pt sit to stand w/c to RW with modA including finding stable position upon arising;  pt able to stand with CGA for 2 min; stand to sit with minA to control descent. -Stand pivot transfer 90* with RW between wheelchair and 18 inch chair with armrest with  modA and heavy verbal cues on technique.  Patient has greater difficulty transferring to the right towards the prosthetic limb then she does transferring to the left. -pt amb 15' with RW straight path with mod-maxA.      TREATMENT:                                                                                                             DATE: 10/14/2023 Therapeutic Activities with Transtibial Prosthesis: -PT educated pt & husband on dressing & cleaning recommendations with use of BSC.  They verbalized understanding.  -pt sit to stand w/c to RW with modA including finding stable position upon arising;  pt able to stand with CGA for 2 min; stand to sit with minA to control descent. -pt amb 5' with RW straight path with maxA.    Therapeutic Exercise: -seated AROM cervical rotation right/left, side bend right/left, flexion & extension 5 reps ea with deep breath end range. -seated AROM trunk rotation right/left, side bend right/left, flexion & extension 5 reps ea with deep breath end range. -PT educated pt & husband on need to continue with HEP for ROM & functional strength.  Sitting with no activity may result in decreased range & strength with increased pain.  Pt & husband verbalized understanding.    TREATMENT:                                                                                                             DATE: 10/12/2023 Therapeutic Activities with Transtibial Prosthesis: - Pt able to perform scooting / small squat pivot transfer w/c to adjacent bariatric chair (similar to her BSC) with minA/CGA with cues on technique.  PT continues to recommend transferring to her [facility] with husband's assist daily to attempt bowel movement.  Currently she has bowel movements in her diaper and husband cleans her as soon as able.  When they are out of house like medical appointments this may not be immediately.  PT educated pt & husband that this can be large contributing factor to her recurrent UTIs.    Self-Care: -PT demo & verbal cues on modifications to enable more independence in managing hair care by herself. PT recommended placing forehead on towel roll on table top and elbows table so she can get her hands to her own hair.  Pt able to return demo without issues with her shoulders especially RUE.    Therapeutic Exercise: AROM cervical rotation right/left, side bend right/left, flexion & extension 5 reps ea. Pt verbalized understanding to add to HEP while neck pain is an issue.      PATIENT EDUCATION: PATIENT EDUCATED ON FOLLOWING PROSTHETIC CARE: Education details: Use of stockinette under proximal liner to decrease itching sensation, no wounds presents to PT recommended not using Vive wear under liner Skin check, Prosthetic cleaning, Propper donning, and Proper wear schedule/adjustment Prosthetic wear tolerance: 3 hours 2x/day, 4 days/week Person educated: Patient and Spouse Education method: Explanation, Tactile cues, and Verbal cues Education comprehension: verbalized understanding, verbal cues required, tactile cues required, and needs further education  HOME EXERCISE PROGRAM: Access Code: [email or web address] URL: [email or web address] Date: 09/23/2023 Prepared by: Lorie Rook  Exercises - Supine Bridge  - 1 x daily - 4 x weekly - 2 sets - 5 reps - 2 seconds hold - Supine Lower Trunk Rotation  - 1 x daily - 4 x weekly - 2 sets - 5 reps - 5 seconds hold - Supine Heel Slide with Strap  - 1 x daily - 4 x weekly - 2 sets - 5 reps - 2-3 seconds hold - Supine Hip Abduction  -  1 x daily - 4 x weekly - 2 sets - 5 reps - 2-3 seconds hold - Seated Hip Flexion Toward Target  - 1 x daily - 4 x weekly - 2 sets - 5 reps - 5 seconds hold - Seated Eccentric Abdominal Lean Back  - 1 x daily - 4 x weekly - 2 sets - 5 reps - 5 seconds hold - Seated Sidebending  - 1 x daily - 4 x weekly - 2 sets - 5 reps - 5 seconds hold - Seated Trunk Rotation with Crossed Arms  - 1 x daily - 4 x  weekly - 2 sets - 5 reps - 5 seconds hold - Seated March  - 1 x daily - 4 x weekly - 2 sets - 10 reps - 5 seconds hold - Seated Knee Flexion Extension AROM   - 1 x daily - 4 x weekly - 2 sets - 10 reps - 5 seconds hold    ASSESSMENT:  CLINICAL IMPRESSION: PT continues to try to progress therapeutic activities but patient's extreme weakness and low endurance continue to limit her.  Her gait was improved today compared to last week.  Patient continues to benefit from skilled PT.  OBJECTIVE IMPAIRMENTS: Abnormal gait, decreased activity tolerance, decreased balance, decreased endurance, decreased knowledge of condition, decreased knowledge of use of DME, decreased mobility, difficulty walking, decreased ROM, decreased strength, decreased safety awareness, postural dysfunction, prosthetic dependency , and pain.   ACTIVITY LIMITATIONS: standing, transfers, and locomotion level  PARTICIPATION LIMITATIONS: community activity, household mobility and dependency / burden of care on family  PERSONAL FACTORS: Age, Fitness, Past/current experiences, Time since onset of injury/illness/exacerbation, and 3+ comorbidities: see PMH are also affecting patient's functional outcome.   REHAB POTENTIAL: Good  CLINICAL DECISION MAKING: Evolving/moderate complexity  EVALUATION COMPLEXITY: Moderate   GOALS: Goals reviewed with patient? Yes  SHORT TERM GOALS: Target date: 10/07/2023:  Patient & husband verbalized patient able to perform scooting transfer to / from [facility] and sit to/from stand [employer] to RW with husband's assistance safely. Baseline: SEE OBJECTIVE DATA Goal status:   MET  10/14/2023 2.  Patient able to sit to stand w/c to rolling walker with min assistance. Baseline: SEE OBJECTIVE DATA Goal status: NOT  MET  10/14/2023  3. Patient ambulates 60ft including turning 90* to position to sit with RW & prosthesis with modA.  Baseline: NOT  MET  10/14/2023  UPDATED LONG TERM GOALS: Target date:  12/03/2023  Patient & husband continues to demonstrate & verbalize understanding of prosthetic care and wears prosthesis >80% of awake hours daily to enable safe utilization of prosthesis. Baseline: SEE OBJECTIVE DATA Goal status: Ongoing    10/21/2023  Patient and husband report patient is able to stand to enable cleaning after toileting safely with no issues Baseline: SEE OBJECTIVE DATA Goal status: Ongoing  10/21/2023  Patient able to perform sit to/from stand transfers with minA.  Baseline: SEE OBJECTIVE DATA Goal status: Ongoing   10/21/2023  Patient ambulates >49ft including turning 90* to position to sit with prosthesis & RW with minA with husband's assistance safely. Baseline: SEE OBJECTIVE DATA Goal status: Ongoing  10/21/2023  Patient understanding of ongoing HEP. Baseline: SEE OBJECTIVE DATA Goal status:  Ongoing  10/21/2023  PLAN:  PT FREQUENCY: 2x/week  PT DURATION: 12 weeks  PLANNED INTERVENTIONS: 97164- PT Re-evaluation, 97110-Therapeutic exercises, 97530- Therapeutic activity, 97112- Neuromuscular re-education, [phone]- Self Care, 60454- Gait training, [phone]- Prosthetic training, Patient/Family education, Balance training, DME instructions, Therapeutic exercises, Therapeutic activity,  Neuromuscular re-education, Gait training, and Self Care  PLAN FOR NEXT SESSION:    Continue to encourage daily use of BSC.  Continue with standing & gait activities.    Lorie Rook, PT, DPT 10/21/2023, 4:17 PM

## 2023-10-24 DIAGNOSIS — Z992 Dependence on renal dialysis: Secondary | ICD-10-CM | POA: Diagnosis not present

## 2023-10-24 DIAGNOSIS — N186 End stage renal disease: Secondary | ICD-10-CM | POA: Diagnosis not present

## 2023-10-24 DIAGNOSIS — N179 Acute kidney failure, unspecified: Secondary | ICD-10-CM | POA: Diagnosis not present

## 2023-10-26 ENCOUNTER — Encounter: Payer: Self-pay | Admitting: Family Medicine

## 2023-10-26 ENCOUNTER — Ambulatory Visit (INDEPENDENT_AMBULATORY_CARE_PROVIDER_SITE_OTHER): Admitting: Family Medicine

## 2023-10-26 ENCOUNTER — Ambulatory Visit: Admitting: Physical Therapy

## 2023-10-26 ENCOUNTER — Encounter: Payer: Self-pay | Admitting: Physical Therapy

## 2023-10-26 VITALS — BP 102/56 | HR 77 | Temp 97.6°F | Ht 65.0 in | Wt 168.0 lb

## 2023-10-26 DIAGNOSIS — R2689 Other abnormalities of gait and mobility: Secondary | ICD-10-CM

## 2023-10-26 DIAGNOSIS — N186 End stage renal disease: Secondary | ICD-10-CM | POA: Diagnosis not present

## 2023-10-26 DIAGNOSIS — M6281 Muscle weakness (generalized): Secondary | ICD-10-CM

## 2023-10-26 DIAGNOSIS — G546 Phantom limb syndrome with pain: Secondary | ICD-10-CM | POA: Diagnosis not present

## 2023-10-26 DIAGNOSIS — E1165 Type 2 diabetes mellitus with hyperglycemia: Secondary | ICD-10-CM | POA: Diagnosis not present

## 2023-10-26 DIAGNOSIS — Z794 Long term (current) use of insulin: Secondary | ICD-10-CM

## 2023-10-26 DIAGNOSIS — I5032 Chronic diastolic (congestive) heart failure: Secondary | ICD-10-CM

## 2023-10-26 DIAGNOSIS — R2681 Unsteadiness on feet: Secondary | ICD-10-CM

## 2023-10-26 DIAGNOSIS — S161XXA Strain of muscle, fascia and tendon at neck level, initial encounter: Secondary | ICD-10-CM

## 2023-10-26 DIAGNOSIS — Z7409 Other reduced mobility: Secondary | ICD-10-CM

## 2023-10-26 MED ORDER — PREDNISONE 20 MG PO TABS: ORAL_TABLET | ORAL | 0 refills | Status: DC

## 2023-10-26 NOTE — Progress Notes (Signed)
 Subjective:  Patient was admitted to the hospital in early May due to a urinary tract infection.  She is here today for follow-up.  Biggest issue since discharge from the hospital has been pain in the left and right side of her neck.  She has been taking methocarbamol  3 times a day without relief.  She reports pain turning her head to the left and right.  She reports pain with flexion of her neck.  She is on Eliquis .  Tylenol  has not helped the pain at all.  She denies any neuropathic pain radiating down her arms.  There is no palpable lymphadenopathy in the neck.  There is no erythema or rash or warmth.  There are palpable muscle spasms in the cervical paraspinal muscles.  Positive and vomiting has improved.  She is using Zofran  sparingly for nausea.  She does have to take midodrine  20 mg prior to dialysis due to hypotension.  She occasionally gets dizzy with low blood pressure on nondialysis days as well.  She denies any fever or chills or chest pain or shortness of breath. Past Medical History:  Diagnosis Date   Anemia    hx   Anxiety    Arthritis    "generalized" (03/15/2014)   CAD (coronary artery disease)    MI in 2000 - MI  2007 - treated bare metal stent (no nuclear since then as 9/11)   Carotid artery disease (HCC)    Chronic diastolic heart failure (HCC)    a) ECHO (08/2013) EF 55-60% and RV function nl b) RHC (08/2013) RA 4, RV 30/5/7, PA 25/10 (16), PCWP 7, Fick CO/CI 6.3/2.7, PVR 1.5 WU, PA 61 and 66%   Daily headache    "~ every other day; since I fell in June" (03/15/2014)   Depression    Diabetic retinopathy (HCC)    Dyslipidemia    ESRD (end stage renal disease) (HCC)    Dialysis on Tues Thurs Sat   Exertional shortness of breath    GERD (gastroesophageal reflux disease)    History of blood transfusion    History of kidney stones    HTN (hypertension)    Hypothyroidism    Myocardial infarction (HCC)    Obesity    Osteoarthritis    PAF (paroxysmal atrial fibrillation)  (HCC)    Peripheral neuropathy    bilateral feet/hands   PONV (postoperative nausea and vomiting)    RBBB (right bundle branch block)    Old   Stroke (HCC)    mini strokes   Type II diabetes mellitus (HCC)    Type II, [name] libre left upper arm. patient has omnipod insulin  pump with Novolin R Insulin    Past Surgical History:  Procedure Laterality Date   A/V FISTULAGRAM Left 11/07/2022   Procedure: A/V Fistulagram;  Surgeon: Carlene Che, MD;  [facility]: [facility];  Service: Cardiovascular;  Laterality: Left;   ABDOMINAL HYSTERECTOMY  1980's   AMPUTATION Right 02/24/2018   Procedure: RIGHT FOOT GREAT TOE AND 2ND TOE AMPUTATION;  Surgeon: Timothy Ford, MD;  Location: [facility] OR;  Service: Orthopedics;  Laterality: Right;   AMPUTATION Right 04/30/2018   Procedure: RIGHT TRANSMETATARSAL AMPUTATION;  Surgeon: Timothy Ford, MD;  Location: [facility] OR;  Service: Orthopedics;  Laterality: Right;   AMPUTATION Right 05/02/2022   Procedure: RIGHT BELOW KNEE AMPUTATION;  Surgeon: Timothy Ford, MD;  Location: [facility] OR;  Service: Orthopedics;  Laterality: Right;   APPLICATION OF WOUND VAC Right 06/13/2022   Procedure:  APPLICATION OF WOUND VAC;  Surgeon: Timothy Ford, MD;  Location: [facility] OR;  Service: Orthopedics;  Laterality: Right;   APPLICATION OF WOUND VAC Left 11/14/2022   Procedure: APPLICATION OF WOUND VAC;  Surgeon: Timothy Ford, MD;  [facility];  Service: Orthopedics;  Laterality: Left;   AV FISTULA PLACEMENT Left 04/02/2022   Procedure: LEFT ARM ARTERIOVENOUS (AV) FISTULA CREATION;  Surgeon: Margherita Shell, MD;  Location: [facility] OR;  Service: Vascular;  Laterality: Left;  PERIPHERAL NERVE BLOCK   AV FISTULA PLACEMENT Right 04/27/2023   Procedure: RIGHT ARM BRACHIOBASILIC ARTERIOVENOUS (AV) FISTULA CREATION;  Surgeon: Carlene Che, MD;  Location: [facility] OR;  Service: Vascular;  Laterality: Right;   BASCILIC VEIN TRANSPOSITION Left 07/31/2022   Procedure: LEFT ARM SECOND STAGE BASILIC VEIN  TRANSPOSITION;  Surgeon: Margherita Shell, MD;  [facility]: [facility];  Service: Vascular;  Laterality: Left;   BIOPSY  05/27/2020   Procedure: BIOPSY;  Surgeon: Vinetta Greening, DO;  [facility]: [facility];  Service: Endoscopy;;   CATARACT EXTRACTION, BILATERAL Bilateral ?2013   COLONOSCOPY W/ POLYPECTOMY     COLONOSCOPY WITH PROPOFOL  N/A 03/13/2019   Procedure: COLONOSCOPY WITH PROPOFOL ;  Surgeon: Nannette Babe, MD;  Location: [facility] ENDOSCOPY;  Service: Gastroenterology;  Laterality: N/A;   CORONARY ANGIOPLASTY WITH STENT PLACEMENT  1999; 2007   "1 + 1"   ERCP N/A 02/03/2022   Procedure: ENDOSCOPIC RETROGRADE CHOLANGIOPANCREATOGRAPHY (ERCP);  Surgeon: Alvis Jourdain, MD;  Location: [facility] ENDOSCOPY;  Service: Gastroenterology;  Laterality: N/A;   ESOPHAGOGASTRODUODENOSCOPY N/A 02/12/2023   Procedure: ESOPHAGOGASTRODUODENOSCOPY (EGD);  Surgeon: Alvis Jourdain, MD;  Location: [facility] ENDOSCOPY;  Service: Gastroenterology;  Laterality: N/A;   ESOPHAGOGASTRODUODENOSCOPY (EGD) WITH PROPOFOL  N/A 03/13/2019   Procedure: ESOPHAGOGASTRODUODENOSCOPY (EGD) WITH PROPOFOL ;  Surgeon: Nannette Babe, MD;  [facility];  Service: Gastroenterology;  Laterality: N/A;   ESOPHAGOGASTRODUODENOSCOPY (EGD) WITH PROPOFOL  N/A 05/27/2020   Procedure: ESOPHAGOGASTRODUODENOSCOPY (EGD) WITH PROPOFOL ;  Surgeon: Vinetta Greening, DO;  [facility];  Service: Endoscopy;  Laterality: N/A;   ESOPHAGOGASTRODUODENOSCOPY (EGD) WITH PROPOFOL  N/A 09/03/2022   Procedure: ESOPHAGOGASTRODUODENOSCOPY (EGD) WITH PROPOFOL ;  Surgeon: Alvis Jourdain, MD;  Location[facility] ENDOSCOPY;  Service: Gastroenterology;  Laterality: N/A;   EYE SURGERY Bilateral    lazer   FLEXIBLE SIGMOIDOSCOPY N/A 05/23/2022   Procedure: FLEXIBLE SIGMOIDOSCOPY;  Surgeon: Alvis Jourdain, MD;  Location: [facility] ENDOSCOPY;  Service: Gastroenterology;  Laterality: N/A;   FLEXIBLE SIGMOIDOSCOPY N/A 05/24/2022   Procedure: FLEXIBLE SIGMOIDOSCOPY;  Surgeon: Daina Drum, MD;   Location: [facility] ENDOSCOPY;  Service: Gastroenterology;  Laterality: N/A;   FLEXIBLE SIGMOIDOSCOPY N/A 09/03/2022   Procedure: FLEXIBLE SIGMOIDOSCOPY;  Surgeon: Alvis Jourdain, MD;  Location: [facility] ENDOSCOPY;  Service: Gastroenterology;  Laterality: N/A;   HEMOSTASIS CLIP PLACEMENT  03/13/2019   Procedure: HEMOSTASIS CLIP PLACEMENT;  Surgeon: Nannette Babe, MD;  Location: [facility] ENDOSCOPY;  Service: Gastroenterology;;   HEMOSTASIS CLIP PLACEMENT  05/23/2022   Procedure: HEMOSTASIS CLIP PLACEMENT;  Surgeon: Alvis Jourdain, MD;  Location: [facility] ENDOSCOPY;  Service: Gastroenterology;;   HEMOSTASIS CONTROL  05/24/2022   Procedure: HEMOSTASIS CONTROL;  Surgeon: Daina Drum, MD;  Location: [facility] ENDOSCOPY;  Service: Gastroenterology;;   HOT HEMOSTASIS N/A 05/23/2022   Procedure: HOT HEMOSTASIS (ARGON PLASMA COAGULATION/BICAP);  Surgeon: Alvis Jourdain, MD;  Location: [facility] ENDOSCOPY;  Service: Gastroenterology;  Laterality: N/A;   I & D EXTREMITY Left 05/05/2022   Procedure: IRRIGATION AND DEBRIDEMENT LEFT ARM AV FISTULA;  Surgeon: Young Hensen, MD;  Location: [facility]  OR;  Service: Vascular;  Laterality: Left;   I & D EXTREMITY N/A 11/14/2022   Procedure: IRRIGATION AND DEBRIDEMENT OF LOWER EXTREMITY WOUND;  Surgeon: Timothy Ford, MD;  [facility] OR;  Service: Orthopedics;  Laterality: N/A;   INSERTION OF DIALYSIS CATHETER Right 04/02/2022   Procedure: INSERTION OF TUNNELED DIALYSIS CATHETER;  Surgeon: Margherita Shell, MD;  [facility] OR;  Service: Vascular;  Laterality: Right;   IR FLUORO GUIDE CV LINE RIGHT  08/10/2023   IR VENOCAVAGRAM IVC  08/10/2023   KNEE ARTHROSCOPY Left 10/25/2006   POLYPECTOMY  03/13/2019   Procedure: POLYPECTOMY;  Surgeon: Nannette Babe, MD;  Location: [facility] ENDOSCOPY;  Service: Gastroenterology;;   REMOVAL OF STONES  02/03/2022   Procedure: REMOVAL OF STONES;  Surgeon: Alvis Jourdain, MD;  Location: [facility] ENDOSCOPY;  Service: Gastroenterology;;   REVISON OF ARTERIOVENOUS FISTULA Left 08/20/2022    Procedure: REVISON OF LEFT ARM ARTERIOVENOUS FISTULA;  Surgeon: Margherita Shell, MD;  [facility];  Service: Vascular;  Laterality: Left;   RIGHT HEART CATH N/A 07/24/2017   Procedure: RIGHT HEART CATH;  Surgeon: Mardell Shade, MD;  [facility];  Service: Cardiovascular;  Laterality: N/A;   RIGHT HEART CATHETERIZATION N/A 09/22/2013   Procedure: RIGHT HEART CATH;  Surgeon: Mardell Shade, MD;  Location: [facility] CATH LAB;  Service: Cardiovascular;  Laterality: N/A;   SHOULDER ARTHROSCOPY WITH OPEN ROTATOR CUFF REPAIR Right 03/14/2014   Procedure: RIGHT SHOULDER ARTHROSCOPY WITH BICEPS RELEASE, OPEN SUBSCAPULA REPAIR, OPEN SUPRASPINATUS REPAIR.;  Surgeon: Jasmine Mesi, MD;  Location: [facility] OR;  Service: Orthopedics;  Laterality: Right;   SPHINCTEROTOMY  02/03/2022   Procedure: SPHINCTEROTOMY;  Surgeon: Alvis Jourdain, MD;  Location: [facility] ENDOSCOPY;  Service: Gastroenterology;;   STUMP REVISION Right 06/13/2022   Procedure: REVISION RIGHT BELOW KNEE AMPUTATION;  Surgeon: Timothy Ford, MD;  Location: [facility] OR;  Service: Orthopedics;  Laterality: Right;   TEE WITHOUT CARDIOVERSION N/A 02/04/2022   Procedure: TRANSESOPHAGEAL ECHOCARDIOGRAM (TEE);  Surgeon: Mardell Shade, MD;  Location: [facility] ENDOSCOPY;  Service: Cardiovascular;  Laterality: N/A;   THROMBECTOMY W/ EMBOLECTOMY Left 08/20/2022   Procedure: THROMBECTOMY OF LEFT ARM ARTERIOVENOUS FISTULA;  Surgeon: Margherita Shell, MD;  Location: [facility] OR;  Service: Vascular;  Laterality: Left;   TOE AMPUTATION Right 02/24/2018   GREAT TOE AND 2ND TOE AMPUTATION   TUBAL LIGATION  1970's   UPPER EXTREMITY VENOGRAPHY N/A 07/31/2023   Procedure: UPPER EXTREMITY VENOGRAPHY;  Surgeon: Carlene Che, MD;  [facility];  Service: Cardiovascular;  Laterality: N/A;   Current Outpatient Medications on File Prior to Visit  Medication Sig Dispense Refill   acetaminophen  (TYLENOL ) 325 MG tablet Take 2 tablets (650 mg total) by  mouth every 6 (six) hours as needed for mild pain (or Fever >/= 101).     albuterol  (VENTOLIN  HFA) 108 (90 Base) MCG/ACT inhaler Inhale 1-2 puffs into the lungs every 6 (six) hours as needed for shortness of breath. 1 each 3   amiodarone  (PACERONE ) 200 MG tablet Take 1 tablet (200 mg total) by mouth daily. 90 tablet 3   apixaban  (ELIQUIS ) 5 MG TABS tablet Take 1 tablet (5 mg total) by mouth 2 (two) times daily. 180 tablet 1   ascorbic acid  (VITAMIN C ) 500 MG tablet Take 1 tablet (500 mg total) by mouth daily. 100 tablet 0   atorvastatin  (LIPITOR ) 80 MG tablet Take 1 tablet (80 mg total) by mouth daily. 90 tablet 1  calcitRIOL  (ROCALTROL ) 0.25 MCG capsule Take 9 capsules (2.25 mcg total) by mouth Every Tuesday,Thursday,and Saturday with dialysis. 30 capsule 0   clonazePAM  (KLONOPIN ) 0.5 MG disintegrating tablet Take 1 tablet (0.5 mg total) by mouth 2 (two) times daily. Stop lorazepam . 60 tablet 1   folic acid  (FOLVITE ) 1 MG tablet Take 1 mg by mouth daily.     folic acid -vitamin b complex-vitamin c -selenium-zinc  (DIALYVITE) 3 MG TABS tablet Take 1 tablet by mouth daily.     Insulin  Disposable Pump (OMNIPOD 5 [id number] INTRO GEN 5) KIT Inject 200 Units into the skin 3 days. Using Novolog  for pod     levothyroxine  (SYNTHROID ) 50 MCG tablet Take 1 tablet (50 mcg total) by mouth daily before breakfast. 90 tablet 1   melatonin 3 MG TABS tablet Take 3 mg by mouth at bedtime.     methocarbamol  (ROBAXIN ) 750 MG tablet Take 1 tablet (750 mg total) by mouth every 6 (six) hours as needed for muscle spasms. 60 tablet 0   Methoxy PEG-Epoetin  Beta (MIRCERA IJ) Mircera     midodrine  (PROAMATINE ) 10 MG tablet Take 1-2 tablets (10-20 mg total) by mouth Every Tuesday,Thursday,and Saturday with dialysis. 30 tablet 1   nitroGLYCERIN  (NITROSTAT ) 0.4 MG SL tablet Place 1 tablet (0.4 mg total) under the tongue every 5 (five) minutes as needed. (Patient taking differently: Place 0.4 mg under the tongue every 5 (five) minutes  as needed for chest pain.) 25 tablet 3   OXYGEN  Inhale 2 L into the lungs at bedtime.     pantoprazole  (PROTONIX ) 40 MG tablet Take 1 tablet (40 mg total) by mouth daily. 180 tablet 1   polyethylene glycol (MIRALAX  / GLYCOLAX ) 17 g packet Take 17 g by mouth daily. (Patient taking differently: Take 17 g by mouth daily as needed for mild constipation.)     sevelamer  carbonate (RENVELA ) 800 MG tablet Take 1,600 mg by mouth 3 (three) times daily with meals.     venlafaxine  XR (EFFEXOR -XR) 75 MG 24 hr capsule Take 1 capsule (75 mg total) by mouth daily with breakfast. Stop trintellix  90 capsule 1   Zinc  30 MG CAPS Take 30 mg by mouth daily.     No current facility-administered medications on file prior to visit.     Allergies  Allergen Reactions   Cephalexin  Diarrhea and Other (See Comments)   Codeine Nausea And Vomiting and Other (See Comments)   Social History   Socioeconomic History   Marital status: Married    Spouse name: Gaylin Ke   Number of children: 3   Years of education: 12th   Highest education level: Not on file  Occupational History    Employer: UNEMPLOYED  Tobacco Use   Smoking status: Former    Current packs/day: 0.00    Average packs/day: 3.0 packs/day for 32.0 years (96.0 ttl pk-yrs)    Types: Cigarettes    Start date: 10/24/1965    Quit date: 10/24/1997    Years since quitting: 26.0   Smokeless tobacco: Never  Vaping Use   Vaping status: Never Used  Substance and Sexual Activity   Alcohol use: Not Currently   Drug use: No   Sexual activity: Not Currently    Birth control/protection: Surgical    Comment: Hysterectomy  Other Topics Concern   Not on file  Social History Narrative   Pt lives at home with her spouse.Caffeine Use- 3 sodas daily.    Social Drivers of Health   Financial Resource Strain: Low Risk  (08/16/2021)  Overall Financial Resource Strain (CARDIA)    Difficulty of Paying Living Expenses: Not hard at all  Food Insecurity: No Food Insecurity  (10/01/2023)   Hunger Vital Sign    Worried About Running Out of Food in the Last Year: Never true    Ran Out of Food in the Last Year: Never true  Transportation Needs: No Transportation Needs (10/01/2023)   PRAPARE - Administrator, Civil Service (Medical): No    Lack of Transportation (Non-Medical): No  Physical Activity: Inactive (08/16/2021)   Exercise Vital Sign    Days of Exercise per Week: 0 days    Minutes of Exercise per Session: 0 min  Stress: No Stress Concern Present (08/16/2021)   [employer] of Occupational Health - Occupational Stress Questionnaire    Feeling of Stress : Not at all  Social Connections: Socially Isolated (10/01/2023)   Social Connection and Isolation Panel [NHANES]    Frequency of Communication with Friends and Family: Once a week    Frequency of Social Gatherings with Friends and Family: Once a week    Attends Religious Services: Never    Database administrator or Organizations: No    Attends Banker Meetings: Never    Marital Status: Married  Catering manager Violence: Not At Risk (10/01/2023)   Humiliation, Afraid, Rape, and Kick questionnaire    Fear of Current or Ex-Partner: No    Emotionally Abused: No    Physically Abused: No    Sexually Abused: No     Review of Systems  Gastrointestinal:  Positive for diarrhea.       Objective:   Physical Exam Constitutional:      General: She is not in acute distress.    Appearance: Normal appearance. She is obese. She is not ill-appearing or toxic-appearing.  Cardiovascular:     Rate and Rhythm: Normal rate and regular rhythm.     Heart sounds: Normal heart sounds. No murmur heard.    No friction rub. No gallop.  Pulmonary:     Effort: Pulmonary effort is normal. No respiratory distress.     Breath sounds: No stridor. No wheezing, rhonchi or rales.    Abdominal:     General: Bowel sounds are normal.     Palpations: Abdomen is soft.     Tenderness: There is no abdominal  tenderness.  Musculoskeletal:        General: Tenderness present. No deformity or signs of injury.     Cervical back: Spasms and tenderness present. No bony tenderness. Pain with movement present. Decreased range of motion.     Left lower leg: No edema.  Skin:    General: Skin is warm.  Neurological:     Mental Status: She is alert.        Assessment & Plan:  Strain of neck muscle, initial encounter  ESRF (end stage renal failure) (HCC)  Chronic diastolic (congestive) heart failure (HCC)  Uncontrolled type 2 diabetes mellitus with hyperglycemia, with long-term current use of insulin  (HCC) Patient is currently on Klonopin  for anxiety.  I want to avoid multiple sedating medicines to prevent delirium.  Therefore I will add prednisone  taper pack for the cervical neck strain as the patient is unable to take NSAIDs due to Eliquis .  This will likely exacerbate her sugars for 1 week however her most recent A1c was well-controlled while in the hospital and her sugars are all below 200 at the present time.  Patient has nausea related to her  renal failure.  She can use Zofran  sparingly although I cautioned her about sedation.  The patient does not appear fluid overloaded today and her lungs are clear.  She is in normal sinus rhythm with her heart rate well-controlled due to amiodarone .  I did tell the patient that if her systolic blood pressure is less than 100 and she is feeling dizzy she can take midodrine  up to every 8 hours 10 mg.

## 2023-10-26 NOTE — Therapy (Signed)
 OUTPATIENT PHYSICAL THERAPY PROSTHETIC TREATMENT  Patient Name: Susan [name] MRN: [id number] DOB:[birth year], 73 y.o., female Today's Date: 10/26/2023  PCP: Austine Lefort, MD  REFERRING PROVIDER:  Gearldean Keepers, MD  END OF SESSION:  PT End of Session - 10/26/23 1519     Visit Number 44    Number of Visits 60    Date for PT Re-Evaluation 12/03/23    Authorization Type [employer]    Progress Note Due on Visit 46    PT Start Time 1515    PT Stop Time 1557    PT Time Calculation (min) 42 min    Equipment Utilized During Treatment Gait belt    Activity Tolerance Patient tolerated treatment well;Patient limited by fatigue    Behavior During Therapy WFL for tasks assessed/performed               Past Medical History:  Diagnosis Date   Anemia    hx   Anxiety    Arthritis    "generalized" (03/15/2014)   CAD (coronary artery disease)    MI in 2000 - MI  2007 - treated bare metal stent (no nuclear since then as 9/11)   Carotid artery disease (HCC)    Chronic diastolic heart failure (HCC)    a) ECHO (08/2013) EF 55-60% and RV function nl b) RHC (08/2013) RA 4, RV 30/5/7, PA 25/10 (16), PCWP 7, Fick CO/CI 6.3/2.7, PVR 1.5 WU, PA 61 and 66%   Daily headache    "~ every other day; since I fell in June" (03/15/2014)   Depression    Diabetic retinopathy (HCC)    Dyslipidemia    ESRD (end stage renal disease) (HCC)    Dialysis on Tues Thurs Sat   Exertional shortness of breath    GERD (gastroesophageal reflux disease)    History of blood transfusion    History of kidney stones    HTN (hypertension)    Hypothyroidism    Myocardial infarction (HCC)    Obesity    Osteoarthritis    PAF (paroxysmal atrial fibrillation) (HCC)    Peripheral neuropathy    bilateral feet/hands   PONV (postoperative nausea and vomiting)    RBBB (right bundle branch block)    Old   Stroke (HCC)    mini strokes   Type II diabetes mellitus (HCC)    Type II, [name] libre left  upper arm. patient has omnipod insulin  pump with Novolin R Insulin    Past Surgical History:  Procedure Laterality Date   A/V FISTULAGRAM Left 11/07/2022   Procedure: A/V Fistulagram;  Surgeon: Carlene Che, MD;  [facility]: [facility];  Service: Cardiovascular;  Laterality: Left;   ABDOMINAL HYSTERECTOMY  1980's   AMPUTATION Right 02/24/2018   Procedure: RIGHT FOOT GREAT TOE AND 2ND TOE AMPUTATION;  Surgeon: Timothy Ford, MD;  Location: [facility] OR;  Service: Orthopedics;  Laterality: Right;   AMPUTATION Right 04/30/2018   Procedure: RIGHT TRANSMETATARSAL AMPUTATION;  Surgeon: Timothy Ford, MD;  Location: [facility] OR;  Service: Orthopedics;  Laterality: Right;   AMPUTATION Right 05/02/2022   Procedure: RIGHT BELOW KNEE AMPUTATION;  Surgeon: Timothy Ford, MD;  Location: [facility] OR;  Service: Orthopedics;  Laterality: Right;   APPLICATION OF WOUND VAC Right 06/13/2022   Procedure: APPLICATION OF WOUND VAC;  Surgeon: Timothy Ford, MD;  [facility]: [facility] OR;  Service: Orthopedics;  Laterality: Right;   APPLICATION OF WOUND VAC Left 11/14/2022   Procedure: APPLICATION OF WOUND VAC;  Surgeon: Timothy Ford, MD;  Location: [facility] OR;  Service: Orthopedics;  Laterality: Left;   AV FISTULA PLACEMENT Left 04/02/2022   Procedure: LEFT ARM ARTERIOVENOUS (AV) FISTULA CREATION;  Surgeon: Margherita Shell, MD;  Location: [facility] OR;  Service: Vascular;  Laterality: Left;  PERIPHERAL NERVE BLOCK   AV FISTULA PLACEMENT Right 04/27/2023   Procedure: RIGHT ARM BRACHIOBASILIC ARTERIOVENOUS (AV) FISTULA CREATION;  Surgeon: Carlene Che, MD;  Location: [facility] OR;  Service: Vascular;  Laterality: Right;   BASCILIC VEIN TRANSPOSITION Left 07/31/2022   Procedure: LEFT ARM SECOND STAGE BASILIC VEIN TRANSPOSITION;  Surgeon: Margherita Shell, MD;  [facility]: [facility] OR;  Service: Vascular;  Laterality: Left;   BIOPSY  05/27/2020   Procedure: BIOPSY;  Surgeon: Vinetta Greening, DO;  [facility]: [facility];  Service: Endoscopy;;   CATARACT  EXTRACTION, BILATERAL Bilateral ?2013   COLONOSCOPY W/ POLYPECTOMY     COLONOSCOPY WITH PROPOFOL  N/A 03/13/2019   Procedure: COLONOSCOPY WITH PROPOFOL ;  Surgeon: Nannette Babe, MD;  Location: [facility] ENDOSCOPY;  Service: Gastroenterology;  Laterality: N/A;   CORONARY ANGIOPLASTY WITH STENT PLACEMENT  1999; 2007   "1 + 1"   ERCP N/A 02/03/2022   Procedure: ENDOSCOPIC RETROGRADE CHOLANGIOPANCREATOGRAPHY (ERCP);  Surgeon: Alvis Jourdain, MD;  Location: [facility] ENDOSCOPY;  Service: Gastroenterology;  Laterality: N/A;   ESOPHAGOGASTRODUODENOSCOPY N/A 02/12/2023   Procedure: ESOPHAGOGASTRODUODENOSCOPY (EGD);  Surgeon: Alvis Jourdain, MD;  Location: [facility] ENDOSCOPY;  Service: Gastroenterology;  Laterality: N/A;   ESOPHAGOGASTRODUODENOSCOPY (EGD) WITH PROPOFOL  N/A 03/13/2019   Procedure: ESOPHAGOGASTRODUODENOSCOPY (EGD) WITH PROPOFOL ;  Surgeon: Nannette Babe, MD;  Location: [facility] ENDOSCOPY;  Service: Gastroenterology;  Laterality: N/A;   ESOPHAGOGASTRODUODENOSCOPY (EGD) WITH PROPOFOL  N/A 05/27/2020   Procedure: ESOPHAGOGASTRODUODENOSCOPY (EGD) WITH PROPOFOL ;  Surgeon: Vinetta Greening, DO;  [facility];  Service: Endoscopy;  Laterality: N/A;   ESOPHAGOGASTRODUODENOSCOPY (EGD) WITH PROPOFOL  N/A 09/03/2022   Procedure: ESOPHAGOGASTRODUODENOSCOPY (EGD) WITH PROPOFOL ;  Surgeon: Alvis Jourdain, MD;  Location: [facility] ENDOSCOPY;  Service: Gastroenterology;  Laterality: N/A;   EYE SURGERY Bilateral    lazer   FLEXIBLE SIGMOIDOSCOPY N/A 05/23/2022   Procedure: FLEXIBLE SIGMOIDOSCOPY;  Surgeon: Alvis Jourdain, MD;  Location: [facility] ENDOSCOPY;  Service: Gastroenterology;  Laterality: N/A;   FLEXIBLE SIGMOIDOSCOPY N/A 05/24/2022   Procedure: FLEXIBLE SIGMOIDOSCOPY;  Surgeon: Daina Drum, MD;  Location: [facility] ENDOSCOPY;  Service: Gastroenterology;  Laterality: N/A;   FLEXIBLE SIGMOIDOSCOPY N/A 09/03/2022   Procedure: FLEXIBLE SIGMOIDOSCOPY;  Surgeon: Alvis Jourdain, MD;  Location: [facility] ENDOSCOPY;  Service: Gastroenterology;  Laterality:  N/A;   HEMOSTASIS CLIP PLACEMENT  03/13/2019   Procedure: HEMOSTASIS CLIP PLACEMENT;  Surgeon: Nannette Babe, MD;  Location: [facility] ENDOSCOPY;  Service: Gastroenterology;;   HEMOSTASIS CLIP PLACEMENT  05/23/2022   Procedure: HEMOSTASIS CLIP PLACEMENT;  Surgeon: Alvis Jourdain, MD;  Location: [facility] ENDOSCOPY;  Service: Gastroenterology;;   HEMOSTASIS CONTROL  05/24/2022   Procedure: HEMOSTASIS CONTROL;  Surgeon: Daina Drum, MD;  Location: [facility] ENDOSCOPY;  Service: Gastroenterology;;   HOT HEMOSTASIS N/A 05/23/2022   Procedure: HOT HEMOSTASIS (ARGON PLASMA COAGULATION/BICAP);  Surgeon: Alvis Jourdain, MD;  Location: [name] ENDOSCOPY;  Service: Gastroenterology;  Laterality: N/A;   I & D EXTREMITY Left 05/05/2022   Procedure: IRRIGATION AND DEBRIDEMENT LEFT ARM AV FISTULA;  Surgeon: Young Hensen, MD;  Location: [facility] OR;  Service: Vascular;  Laterality: Left;   I & D EXTREMITY N/A 11/14/2022   Procedure: IRRIGATION AND DEBRIDEMENT OF LOWER EXTREMITY WOUND;  Surgeon: Timothy Ford, MD;  [facility];  Service: Orthopedics;  Laterality: N/A;   INSERTION OF DIALYSIS CATHETER Right 04/02/2022   Procedure: INSERTION OF TUNNELED DIALYSIS CATHETER;  Surgeon: Margherita Shell, MD;  Location: [facility] OR;  Service: Vascular;  Laterality: Right;   IR FLUORO GUIDE CV LINE RIGHT  08/10/2023   IR VENOCAVAGRAM IVC  08/10/2023   KNEE ARTHROSCOPY Left 10/25/2006   POLYPECTOMY  03/13/2019   Procedure: POLYPECTOMY;  Surgeon: Nannette Babe, MD;  Location: [facility] ENDOSCOPY;  Service: Gastroenterology;;   REMOVAL OF STONES  02/03/2022   Procedure: REMOVAL OF STONES;  Surgeon: Alvis Jourdain, MD;  Location: [facility] ENDOSCOPY;  Service: Gastroenterology;;   REVISON OF ARTERIOVENOUS FISTULA Left 08/20/2022   Procedure: REVISON OF LEFT ARM ARTERIOVENOUS FISTULA;  Surgeon: Margherita Shell, MD;  [facility] OR;  Service: Vascular;  Laterality: Left;   RIGHT HEART CATH N/A 07/24/2017   Procedure: RIGHT HEART CATH;  Surgeon: Mardell Shade,  MD;  [facility];  Service: Cardiovascular;  Laterality: N/A;   RIGHT HEART CATHETERIZATION N/A 09/22/2013   Procedure: RIGHT HEART CATH;  Surgeon: Mardell Shade, MD;  Location: [facility] CATH LAB;  Service: Cardiovascular;  Laterality: N/A;   SHOULDER ARTHROSCOPY WITH OPEN ROTATOR CUFF REPAIR Right 03/14/2014   Procedure: RIGHT SHOULDER ARTHROSCOPY WITH BICEPS RELEASE, OPEN SUBSCAPULA REPAIR, OPEN SUPRASPINATUS REPAIR.;  Surgeon: Jasmine Mesi, MD;  Location: [facility] OR;  Service: Orthopedics;  Laterality: Right;   SPHINCTEROTOMY  02/03/2022   Procedure: SPHINCTEROTOMY;  Surgeon: Alvis Jourdain, MD;  Location: [facility] ENDOSCOPY;  Service: Gastroenterology;;   STUMP REVISION Right 06/13/2022   Procedure: REVISION RIGHT BELOW KNEE AMPUTATION;  Surgeon: Timothy Ford, MD;  Location: [facility] OR;  Service: Orthopedics;  Laterality: Right;   TEE WITHOUT CARDIOVERSION N/A 02/04/2022   Procedure: TRANSESOPHAGEAL ECHOCARDIOGRAM (TEE);  Surgeon: Mardell Shade, MD;  Location: [facility] ENDOSCOPY;  Service: Cardiovascular;  Laterality: N/A;   THROMBECTOMY W/ EMBOLECTOMY Left 08/20/2022   Procedure: THROMBECTOMY OF LEFT ARM ARTERIOVENOUS FISTULA;  Surgeon: Margherita Shell, MD;  Location: [facility] OR;  Service: Vascular;  Laterality: Left;   TOE AMPUTATION Right 02/24/2018   GREAT TOE AND 2ND TOE AMPUTATION   TUBAL LIGATION  1970's   UPPER EXTREMITY VENOGRAPHY N/A 07/31/2023   Procedure: UPPER EXTREMITY VENOGRAPHY;  Surgeon: Carlene Che, MD;  [facility]: [facility];  Service: Cardiovascular;  Laterality: N/A;   Patient Active Problem List   Diagnosis Date Noted   UTI (urinary tract infection) 10/01/2023   Pyelonephritis 08/06/2023   Complicated UTI (urinary tract infection) 08/05/2023   Atrial fibrillation (HCC) 06/17/2023   Atopic dermatitis 06/17/2023   Hyperosmolar hyperglycemic state (HHS) (HCC) 02/05/2023   Gastroparesis 02/05/2023   Depression 01/29/2023   Intractable nausea and vomiting  01/20/2023   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 difficile colitis 06/05/2022   Spleen hematoma 06/05/2022   Dehiscence of amputation stump of right lower extremity (HCC) 06/05/2022   Rectal ulcer 05/27/2022   ESRD (end stage renal disease) (HCC) 05/27/2022  GI bleed 05/23/2022   Difficult intravenous access 05/23/2022   Gangrene of right foot (HCC) 05/02/2022   S/P BKA (below knee amputation) unilateral, right (HCC) 05/02/2022   Unspecified protein-calorie malnutrition (HCC) 04/15/2022   Secondary hyperparathyroidism of renal origin (HCC) 04/14/2022   Coagulation defect, unspecified (HCC) 04/09/2022   Acquired absence of other left toe(s) (HCC) 04/07/2022   Allergy, unspecified, initial encounter 04/07/2022   Dependence on renal dialysis (HCC) 04/07/2022   Gout due to renal impairment, unspecified site 04/07/2022   Hypertensive heart and chronic kidney disease with heart failure and with stage 5 chronic kidney disease, or end stage renal disease (HCC) 04/07/2022   Personal history of transient ischemic attack (TIA), and cerebral infarction without residual deficits 04/07/2022   Renal osteodystrophy 04/07/2022   Venous stasis ulcer of right calf (HCC) 03/31/2022   Fistula, colovaginal 03/26/2022   Diarrhea 03/26/2022   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   Sepsis without acute organ dysfunction (HCC)    Bacteremia    Acute  pancreatitis 02/01/2022   Abdominal pain 02/01/2022   SIRS (systemic inflammatory response syndrome) (HCC) 02/01/2022   Transaminitis 02/01/2022   History of anemia due to chronic kidney disease 02/01/2022   Paroxysmal atrial fibrillation (HCC) 02/01/2022   Uncontrolled type 2 diabetes mellitus with hyperglycemia, with long-term current use of insulin  (HCC) 01/14/2022   NSTEMI (non-ST elevated myocardial infarction) (HCC) 03/05/2021   Acute renal failure superimposed on stage 4 chronic kidney disease (HCC) 08/22/2020   Hypoalbuminemia 05/25/2020   GERD (gastroesophageal reflux disease) 05/25/2020   Pressure injury of skin 05/17/2020   Acute on chronic combined systolic and diastolic congestive heart failure (HCC) 03/07/2020   Type 2 diabetes mellitus with diabetic polyneuropathy, with long-term current use of insulin  (HCC) 03/07/2020   Obesity, Class III, BMI 40-49.9 (morbid obesity) 03/07/2020   Common bile duct (CBD) obstruction 05/28/2019   Benign neoplasm of ascending colon    Benign neoplasm of transverse colon    Benign neoplasm of descending colon    Benign neoplasm of sigmoid colon    Gastric polyps    Hyperkalemia 03/11/2019   Prolonged QT interval 03/11/2019   Acute blood loss anemia 03/11/2019   Onychomycosis 06/21/2018   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)    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 (HCC)    PVD (peripheral vascular disease) (HCC) 10/26/2017   E-coli UTI 07/27/2017   Hypothyroidism 07/27/2017   AKI (acute kidney injury) (HCC)    PAH (pulmonary artery hypertension) (HCC)    Impaired ambulation 07/19/2017   Leg cramps 02/27/2017   Peripheral edema 01/12/2017   Diabetic neuropathy (HCC) 11/12/2016   Anemia of chronic disease 10/03/2015   Historical diagnosis of generalized anxiety disorder 10/03/2015   Secondary insomnia 10/03/2015   Acute bronchitis 09/05/2015   Hyperglycemia due to diabetes mellitus (HCC) 06/07/2015    Non compliance with medical treatment 04/17/2014   Rotator cuff tear 03/14/2014   Obesity 09/23/2013   Chronic HFrEF (heart failure with reduced ejection fraction) (HCC) 06/03/2013   Hypotension 12/25/2012   Hypokalemia 12/25/2012   Hyponatremia 12/25/2012   Urinary incontinence    MDD (major depressive disorder), recurrent episode, moderate (HCC) 11/12/2010   RBBB (right bundle branch block)    Wide-complex tachycardia    Coronary artery disease    Hyperlipemia 01/22/2009   Chronic hypotension 01/22/2009    ONSET DATE: 03/09/2023 MD referral to PT  REFERRING DIAG: S88.111D (ICD-10-CM) - Below-knee amputation of right lower extremity, subsequent encounter  THERAPY DIAG:  Muscle weakness (generalized)  Other abnormalities of gait and mobility  Unsteadiness on feet  Phantom limb syndrome with pain (HCC)  Impaired functional mobility, balance, gait, and endurance  Rationale for Evaluation and Treatment: Rehabilitation  SUBJECTIVE:  SUBJECTIVE STATEMENT: She has been on BSC most mornings.    PERTINENT HISTORY: Right TTA 05/02/22, Lymphedema, idiopathic chronic venous HTN with ulcer, depression, colitis, diverticulitis, ESRD, gout, NSTEMI, DM2, polyneuropathy, PVD, CAD, right bundle branch block, mini strokes  PAIN:  Are you having pain?  Back pain today  8/10 Neck pain 7/10 Right shoulder 7/10   PRECAUTIONS: Fall and Other: No BP LUE  WEIGHT BEARING RESTRICTIONS: No  FALLS: Has patient fallen in last 6 months? No  LIVING ENVIRONMENT: Lives with: lives with their spouse and 2 small dogs Lives in: mobile home Home Access: Ramped entrance Home layout: One level Stairs: Yes: External: 6-7 steps; can reach both Has following equipment at home: Single point cane, Environmental consultant - 2 wheeled, Environmental consultant - 4 wheeled, Wheelchair (manual), [employer], Grab bars, and Ramped entry  OCCUPATION:  retired  PLOF: prior to amputation for ~year used RW in home & community  PATIENT  GOALS:  to use prosthesis to walk, get out w/c in & out of car, get to bathroom.   OBJECTIVE:  COGNITION: Overall cognitive status:  Eval on 03/23/2023:   Within functional limits for tasks assessed  POSTURE:  Eval on 03/23/2023:  rounded shoulders, forward head, increased thoracic kyphosis, flexed trunk , and weight shift left  LOWER EXTREMITY ROM: ROM Right eval Left eval  Hip flexion    Hip extension    Hip abduction    Hip adduction    Hip internal rotation    Hip external rotation    Knee flexion    Knee extension    Ankle dorsiflexion    Ankle plantarflexion    Ankle inversion    Ankle eversion     (Blank rows = not tested)  LOWER EXTREMITY MMT:  MMT Right eval Left eval Right 09/09/23 Left 09/09/23  Hip flexion 3-/5 3-/5 3/5 3/5  Hip extension 2/5 2/5 3-/5 3-/5  Hip abduction 2+/5 2+/5 3-/5 3-/5  Hip adduction      Hip internal rotation      Hip external rotation      Knee flexion 3-/5 3-/5 3/5 3/5  Knee extension 3-/5 3-/5 3/5 3/5  Ankle dorsiflexion      Ankle plantarflexion      Ankle inversion      Ankle eversion      At Evaluation all strength testing is grossly seated and functionally standing / gait. (Blank rows = not tested)  TRANSFERS: 09/07/2023: Scooting transfers with minA and sit to/from stand transfers with modA.   07/27/2023: -Sit>stand from wc with RW: MinA with heavy vc for sequencing and offweighting LE -Stand>sit wc with RW: CGA with patient performing safe controlled descent  06/15/2023: -Sit to stand transfers with RW wheelchair<>mat table at varied height: 19in with maxA to rise to stand; 22in ModA rise to stand, 24in min-modA rise to stand. -stand pivot transfer with RW w/c to mat table with modA once standing.  -Lateral scooting wc<>table at level height SBA; vc for LE positioning for optimal use of LE during transfer -lateral scoot R: modA due to fatigue and uneven surface transfer from low surface to higher  surface   05/04/2023: Squat pivot transfers with modA.  04/21/2023: Pt sit to stand w/c to //bar with one  hand pushing on w/c and other hand on //bar with modA first time & MinA 2nd/ 3rd time.  Best is RUE on //bar & LUE on w/c.  Stand to sit reaching LUE to w/c with minA to control descent.   PT demo & verbal cues on sit to/from stand w/c to RW.  1st time modA 2 person. 2nd time maxA one person.  Stand to sit with minA and constant cueing. Scooting transfers with minA with w/c & mat direct contact & same ht.   Eval on 03/23/2023:  Sit to stand: Max A (two-person assist) from 22" w/c to rolling walker Stand to sit: Max A (two-person assist) rolling walker to wheelchair  FUNCTIONAL TESTs:   09/09/2023: Patient able to tolerate static stance for 5 minutes with BUE support on RW with supervision.  This was current certification goal set so that patient can stand long enough for her husband to assist with cleaning her after toileting.  07/27/2023: Patient stood with RW SBA for 54sec before c/o lightheadedness and sitting down to toilet. Resisted nudges applied posteriorly to simulate hygienic cleaning. VC for weight shifts when LE become tired.  BP: after standing 68min54sec 161/82 HR 92  06/15/2023: -Static stance for standing with RW; requiring Mod-MaxA for rise to stand d/t patient unsteady on feet posterior lean without proper righting strategy, SBA for stand   04/21/2023: Pt able to stand 60 sec with BUE on //bars with supervision.  Pt able to stand for 1 min with RW support with minA.  Eval on 03/23/2023: Patient maintained upright holding rolling walker with mod assist 2 people for safety for 30 seconds.  GAIT: 09/09/2023: Patient ambulated 30' with RW including turning 90*to position to sit with mod assistance  07/27/2023: Patient ambulated 28ft with narrow bathroom entry and 90deg turn to sit on commode CGA with minA and heavy vc for progression and sequencing of RW.  While turning to sit on commode patient lost balance laterally requiring mod-maxA from therapist and patient use of grab bar to regain balance.   06/17/2023: Patient ambulated 60ft (maximal tolerated distance) with RW ModA (2 person for safety). Required VC for LE sequencing and minA to steer walker and prevent tip over.   04/21/2023: Pt amb 5' in //bars with modA (2 person for safety) with PT verbal & tactile cues on sequence and weight shift.  Pt amb 10' with RW with maxA (2 person for safety) with PT verbal & tactile cues on sequence and weight shift.  Eval on 03/23/2023:  Patient took 2 steps (one per LE) with +2 max assist with rolling walker and TTA prosthesis.  Patient adducting prosthesis stepping on left foot with no awareness.  CURRENT PROSTHETIC WEAR ASSESSMENT: 09/07/2023: Patient and husband are independent with: skin check, prosthetic cleaning, ply sock cleaning, proper wear schedule/adjustment, residual limb care, correct ply sock adjustment, and proper weight-bearing schedule/adjustment Pt reports wear most of awake hours on dialysis and non-dialysis days.  She wears prosthesis to dialysis now to assist with transfers prn and is able to verbalize proper adjustment of prosthesis weight for weigh-in & weigh-out.   06/17/2023:  Independence with prosthetic care:  Patient and husband are independent with: skin check, prosthetic cleaning, ply sock cleaning, proper wear schedule/adjustment Patient is dependent with: residual limb care, correct ply sock adjustment, and proper weight-bearing schedule/adjustment Donning prosthesis: husband is independent with proper donning. Patient requires MaxA. Patient can verbally direct someone how to properly assist. Doffing prosthesis: Husband is independent  with proper donning. Patient requires modA. Patient can verbally direct someone how to properly assist. Prosthetic wear tolerance: majority of awake hours including dialysis hours, 1x/day, 3-4  days/week Prosthetic weight bearing tolerance: able to tolerate of standing without complains of prosthetic limb pain. Time not limited by limb discomfort but limited by muscle fatigue.   Eval on 03/23/2023:   Patient is dependent with: skin check, residual limb care, prosthetic cleaning, ply sock cleaning, correct ply sock adjustment, proper wear schedule/adjustment, and proper weight-bearing schedule/adjustment Donning prosthesis: Max A Doffing prosthesis: Min A Prosthetic wear tolerance: 2-6 hours, 1x/day, 3-4 days/week Prosthetic weight bearing tolerance: 3 minutes with limb pain Edema: pitting edema Residual limb condition: No open areas, dry skin, normal color and temperature, cylindrical shape Prosthetic description: Silicone liner with pin lock suspension, total contact socket with flexible inner socket, sach foot    TODAY'S TREATMENT:                                                                                                             DATE: 10/26/2023 Therapeutic Activities with Transtibial Prosthesis: -Pt  sit to stand w/c to RW with mod-maxA. Stand to sit minA.  PT demo & verbal cues on technique.  She improved sit to stand to modA & stand to sit with minA to CGA.  -stand pivot transfer with RW 90* turn with modA.  Pt following directions better today.   -pt amb 7' including 90* turn 1st rep to left and 2nd rep to right with mod-maxA.      TREATMENT:                                                                                                             DATE: 10/21/2023 Therapeutic Activities with Transtibial Prosthesis: -PT educated pt & husband on need to consistently try BSC for BMs.  Patient continues to have issues with having bowel movements in her Depends.  PT discussed how this increases risk of another UTI that could require hospitalization.  PT is recommending daily including on dialysis days that she attempt to have a bowel movement on the bedside commode  by transferring there and sitting for at least 10 minutes.  Patient verbalizes understanding but then makes comments that makes PT question whether is going to be compliance. -pt sit to stand w/c to RW with modA including finding stable position upon arising;  pt able to stand with CGA for 2 min; stand to sit with minA to control descent. -Stand pivot transfer 90* with RW between wheelchair and 18 inch chair with armrest with modA  and heavy verbal cues on technique.  Patient has greater difficulty transferring to the right towards the prosthetic limb then she does transferring to the left. -pt amb 15' with RW straight path with mod-maxA.      TREATMENT:                                                                                                             DATE: 10/14/2023 Therapeutic Activities with Transtibial Prosthesis: -PT educated pt & husband on dressing & cleaning recommendations with use of BSC.  They verbalized understanding.  -pt sit to stand w/c to RW with modA including finding stable position upon arising;  pt able to stand with CGA for 2 min; stand to sit with minA to control descent. -pt amb 5' with RW straight path with maxA.    Therapeutic Exercise: -seated AROM cervical rotation right/left, side bend right/left, flexion & extension 5 reps ea with deep breath end range. -seated AROM trunk rotation right/left, side bend right/left, flexion & extension 5 reps ea with deep breath end range. -PT educated pt & husband on need to continue with HEP for ROM & functional strength.  Sitting with no activity may result in decreased range & strength with increased pain.  Pt & husband verbalized understanding.      PATIENT EDUCATION: PATIENT EDUCATED ON FOLLOWING PROSTHETIC CARE: Education details: Use of stockinette under proximal liner to decrease itching sensation, no wounds presents to PT recommended not using Vive wear under liner Skin check, Prosthetic cleaning, Propper donning,  and Proper wear schedule/adjustment Prosthetic wear tolerance: 3 hours 2x/day, 4 days/week Person educated: Patient and Spouse Education method: Explanation, Tactile cues, and Verbal cues Education comprehension: verbalized understanding, verbal cues required, tactile cues required, and needs further education  HOME EXERCISE PROGRAM: Access Code: [email or web address] URL: [email or web address] Date: 09/23/2023 Prepared by: Lorie Rook  Exercises - Supine Bridge  - 1 x daily - 4 x weekly - 2 sets - 5 reps - 2 seconds hold - Supine Lower Trunk Rotation  - 1 x daily - 4 x weekly - 2 sets - 5 reps - 5 seconds hold - Supine Heel Slide with Strap  - 1 x daily - 4 x weekly - 2 sets - 5 reps - 2-3 seconds hold - Supine Hip Abduction  - 1 x daily - 4 x weekly - 2 sets - 5 reps - 2-3 seconds hold - Seated Hip Flexion Toward Target  - 1 x daily - 4 x weekly - 2 sets - 5 reps - 5 seconds hold - Seated Eccentric Abdominal Lean Back  - 1 x daily - 4 x weekly - 2 sets - 5 reps - 5 seconds hold - Seated Sidebending  - 1 x daily - 4 x weekly - 2 sets - 5 reps - 5 seconds hold - Seated Trunk Rotation with Crossed Arms  - 1 x daily - 4 x weekly - 2 sets - 5 reps - 5 seconds hold - Seated March  - 1 x daily - 4 x  weekly - 2 sets - 10 reps - 5 seconds hold - Seated Knee Flexion Extension AROM   - 1 x daily - 4 x weekly - 2 sets - 10 reps - 5 seconds hold    ASSESSMENT:  CLINICAL IMPRESSION: Patient improved sit to/from stand with instructions & repetition.  Pt improved gait to enable turning to position to sit down.   Patient continues to benefit from skilled PT.  OBJECTIVE IMPAIRMENTS: Abnormal gait, decreased activity tolerance, decreased balance, decreased endurance, decreased knowledge of condition, decreased knowledge of use of DME, decreased mobility, difficulty walking, decreased ROM, decreased strength, decreased safety awareness, postural dysfunction, prosthetic dependency , and pain.    ACTIVITY LIMITATIONS: standing, transfers, and locomotion level  PARTICIPATION LIMITATIONS: community activity, household mobility and dependency / burden of care on family  PERSONAL FACTORS: Age, Fitness, Past/current experiences, Time since onset of injury/illness/exacerbation, and 3+ comorbidities: see PMH are also affecting patient's functional outcome.   REHAB POTENTIAL: Good  CLINICAL DECISION MAKING: Evolving/moderate complexity  EVALUATION COMPLEXITY: Moderate   GOALS: Goals reviewed with patient? Yes  SHORT TERM GOALS: Target date: 11/12/2023:  Patient & husband verbalized patient able to perform scooting transfer to / from [facility] and sit to/from stand [facility] to RW with husband's assistance safely. Baseline: SEE OBJECTIVE DATA Goal status:   MET  10/14/2023 2.  Patient able to sit to stand w/c to rolling walker with min assistance. Baseline: SEE OBJECTIVE DATA Goal status: Ongoing   10/26/2023  3. Patient ambulates 19ft including turning 90* to position to sit with RW & prosthesis with modA.  Baseline: Ongoing   10/26/2023  UPDATED LONG TERM GOALS: Target date: 12/03/2023  Patient & husband continues to demonstrate & verbalize understanding of prosthetic care and wears prosthesis >80% of awake hours daily to enable safe utilization of prosthesis. Baseline: SEE OBJECTIVE DATA Goal status: Ongoing   10/26/2023  Patient and husband report patient is able to stand to enable cleaning after toileting safely with no issues Baseline: SEE OBJECTIVE DATA Goal status: Ongoing  10/26/2023  Patient able to perform sit to/from stand transfers with minA.  Baseline: SEE OBJECTIVE DATA Goal status: Ongoing   10/26/2023  Patient ambulates >60ft including turning 90* to position to sit with prosthesis & RW with minA with husband's assistance safely. Baseline: SEE OBJECTIVE DATA Goal status: Ongoing  10/26/2023  Patient understanding of ongoing HEP. Baseline: SEE OBJECTIVE DATA Goal status:   Ongoing  10/26/2023  PLAN:  PT FREQUENCY: 2x/week  PT DURATION: 12 weeks  PLANNED INTERVENTIONS: 97164- PT Re-evaluation, 97110-Therapeutic exercises, 97530- Therapeutic activity, 97112- Neuromuscular re-education, [phone]- Self Care, 19147- Gait training, [phone]- Prosthetic training, Patient/Family education, Balance training, DME instructions, Therapeutic exercises, Therapeutic activity, Neuromuscular re-education, Gait training, and Self Care  PLAN FOR NEXT SESSION:   Continue to encourage daily use of BSC.  Continue with standing & gait activities.    Dayna Alia, PT, DPT 10/26/2023, 4:11 PM

## 2023-10-27 DIAGNOSIS — D689 Coagulation defect, unspecified: Secondary | ICD-10-CM | POA: Diagnosis not present

## 2023-10-27 DIAGNOSIS — N2581 Secondary hyperparathyroidism of renal origin: Secondary | ICD-10-CM | POA: Diagnosis not present

## 2023-10-27 DIAGNOSIS — Z992 Dependence on renal dialysis: Secondary | ICD-10-CM | POA: Diagnosis not present

## 2023-10-27 DIAGNOSIS — N186 End stage renal disease: Secondary | ICD-10-CM | POA: Diagnosis not present

## 2023-10-28 ENCOUNTER — Ambulatory Visit: Admitting: Physical Therapy

## 2023-10-28 ENCOUNTER — Encounter: Payer: Self-pay | Admitting: Physical Therapy

## 2023-10-28 DIAGNOSIS — R2689 Other abnormalities of gait and mobility: Secondary | ICD-10-CM

## 2023-10-28 DIAGNOSIS — M6281 Muscle weakness (generalized): Secondary | ICD-10-CM

## 2023-10-28 DIAGNOSIS — G546 Phantom limb syndrome with pain: Secondary | ICD-10-CM

## 2023-10-28 DIAGNOSIS — R2681 Unsteadiness on feet: Secondary | ICD-10-CM

## 2023-10-28 DIAGNOSIS — Z7409 Other reduced mobility: Secondary | ICD-10-CM | POA: Diagnosis not present

## 2023-10-28 NOTE — Therapy (Signed)
 OUTPATIENT PHYSICAL THERAPY PROSTHETIC TREATMENT  Patient Name: Susan [name] MRN: [id number] DOB:03-16-1951, 73 y.o., female Today's Date: 10/28/2023  PCP: Austine Lefort, MD  REFERRING PROVIDER:  Gearldean Keepers, MD  END OF SESSION:  PT End of Session - 10/28/23 1520     Visit Number 45    Number of Visits 60    Date for PT Re-Evaluation 12/03/23    Authorization Type [employer]    Progress Note Due on Visit 46    PT Start Time 1517    PT Stop Time 1558    PT Time Calculation (min) 41 min    Equipment Utilized During Treatment Gait belt    Activity Tolerance Patient tolerated treatment well;Patient limited by fatigue    Behavior During Therapy WFL for tasks assessed/performed                Past Medical History:  Diagnosis Date   Anemia    hx   Anxiety    Arthritis    "generalized" (03/15/2014)   CAD (coronary artery disease)    MI in 2000 - MI  2007 - treated bare metal stent (no nuclear since then as 9/11)   Carotid artery disease (HCC)    Chronic diastolic heart failure (HCC)    a) ECHO (08/2013) EF 55-60% and RV function nl b) RHC (08/2013) RA 4, RV 30/5/7, PA 25/10 (16), PCWP 7, Fick CO/CI 6.3/2.7, PVR 1.5 WU, PA 61 and 66%   Daily headache    "~ every other day; since I fell in June" (03/15/2014)   Depression    Diabetic retinopathy (HCC)    Dyslipidemia    ESRD (end stage renal disease) (HCC)    Dialysis on Tues Thurs Sat   Exertional shortness of breath    GERD (gastroesophageal reflux disease)    History of blood transfusion    History of kidney stones    HTN (hypertension)    Hypothyroidism    Myocardial infarction (HCC)    Obesity    Osteoarthritis    PAF (paroxysmal atrial fibrillation) (HCC)    Peripheral neuropathy    bilateral feet/hands   PONV (postoperative nausea and vomiting)    RBBB (right bundle branch block)    Old   Stroke (HCC)    mini strokes   Type II diabetes mellitus (HCC)    Type II, [name] libre left  upper arm. patient has omnipod insulin  pump with Novolin R Insulin    Past Surgical History:  Procedure Laterality Date   A/V FISTULAGRAM Left 11/07/2022   Procedure: A/V Fistulagram;  Surgeon: Carlene Che, MD;  [facility]: [facility];  Service: Cardiovascular;  Laterality: Left;   ABDOMINAL HYSTERECTOMY  1980's   AMPUTATION Right 02/24/2018   Procedure: RIGHT FOOT GREAT TOE AND 2ND TOE AMPUTATION;  Surgeon: Timothy Ford, MD;  Location: [facility] OR;  Service: Orthopedics;  Laterality: Right;   AMPUTATION Right 04/30/2018   Procedure: RIGHT TRANSMETATARSAL AMPUTATION;  Surgeon: Timothy Ford, MD;  Location: [facility] OR;  Service: Orthopedics;  Laterality: Right;   AMPUTATION Right 05/02/2022   Procedure: RIGHT BELOW KNEE AMPUTATION;  Surgeon: Timothy Ford, MD;  Location: [facility] OR;  Service: Orthopedics;  Laterality: Right;   APPLICATION OF WOUND VAC Right 06/13/2022   Procedure: APPLICATION OF WOUND VAC;  Surgeon: Timothy Ford, MD;  Location: [facility] OR;  Service: Orthopedics;  Laterality: Right;   APPLICATION OF WOUND VAC Left 11/14/2022   Procedure: APPLICATION OF WOUND  VAC;  Surgeon: Timothy Ford, MD;  Location: [facility] OR;  Service: Orthopedics;  Laterality: Left;   AV FISTULA PLACEMENT Left 04/02/2022   Procedure: LEFT ARM ARTERIOVENOUS (AV) FISTULA CREATION;  Surgeon: Margherita Shell, MD;  Location: [facility] OR;  Service: Vascular;  Laterality: Left;  PERIPHERAL NERVE BLOCK   AV FISTULA PLACEMENT Right 04/27/2023   Procedure: RIGHT ARM BRACHIOBASILIC ARTERIOVENOUS (AV) FISTULA CREATION;  Surgeon: Carlene Che, MD;  Location: [facility] OR;  Service: Vascular;  Laterality: Right;   BASCILIC VEIN TRANSPOSITION Left 07/31/2022   Procedure: LEFT ARM SECOND STAGE BASILIC VEIN TRANSPOSITION;  Surgeon: Margherita Shell, MD;  Location: [facility] OR;  Service: Vascular;  Laterality: Left;   BIOPSY  05/27/2020   Procedure: BIOPSY;  Surgeon: Vinetta Greening, DO;  Location: [facility];  Service: Endoscopy;;   CATARACT  EXTRACTION, BILATERAL Bilateral ?2013   COLONOSCOPY W/ POLYPECTOMY     COLONOSCOPY WITH PROPOFOL  N/A 03/13/2019   Procedure: COLONOSCOPY WITH PROPOFOL ;  Surgeon: Nannette Babe, MD;  Location: [facility] ENDOSCOPY;  Service: Gastroenterology;  Laterality: N/A;   CORONARY ANGIOPLASTY WITH STENT PLACEMENT  1999; 2007   "1 + 1"   ERCP N/A 02/03/2022   Procedure: ENDOSCOPIC RETROGRADE CHOLANGIOPANCREATOGRAPHY (ERCP);  Surgeon: Alvis Jourdain, MD;  Location: [facility] ENDOSCOPY;  Service: Gastroenterology;  Laterality: N/A;   ESOPHAGOGASTRODUODENOSCOPY N/A 02/12/2023   Procedure: ESOPHAGOGASTRODUODENOSCOPY (EGD);  Surgeon: Alvis Jourdain, MD;  Location: [facility] ENDOSCOPY;  Service: Gastroenterology;  Laterality: N/A;   ESOPHAGOGASTRODUODENOSCOPY (EGD) WITH PROPOFOL  N/A 03/13/2019   Procedure: ESOPHAGOGASTRODUODENOSCOPY (EGD) WITH PROPOFOL ;  Surgeon: Nannette Babe, MD;  Location: [facility] ENDOSCOPY;  Service: Gastroenterology;  Laterality: N/A;   ESOPHAGOGASTRODUODENOSCOPY (EGD) WITH PROPOFOL  N/A 05/27/2020   Procedure: ESOPHAGOGASTRODUODENOSCOPY (EGD) WITH PROPOFOL ;  Surgeon: Vinetta Greening, DO;  [facility];  Service: Endoscopy;  Laterality: N/A;   ESOPHAGOGASTRODUODENOSCOPY (EGD) WITH PROPOFOL  N/A 09/03/2022   Procedure: ESOPHAGOGASTRODUODENOSCOPY (EGD) WITH PROPOFOL ;  Surgeon: Alvis Jourdain, MD;  Location: [facility] ENDOSCOPY;  Service: Gastroenterology;  Laterality: N/A;   EYE SURGERY Bilateral    lazer   FLEXIBLE SIGMOIDOSCOPY N/A 05/23/2022   Procedure: FLEXIBLE SIGMOIDOSCOPY;  Surgeon: Alvis Jourdain, MD;  Location: [facility] ENDOSCOPY;  Service: Gastroenterology;  Laterality: N/A;   FLEXIBLE SIGMOIDOSCOPY N/A 05/24/2022   Procedure: FLEXIBLE SIGMOIDOSCOPY;  Surgeon: Daina Drum, MD;  Location: [facility] ENDOSCOPY;  Service: Gastroenterology;  Laterality: N/A;   FLEXIBLE SIGMOIDOSCOPY N/A 09/03/2022   Procedure: FLEXIBLE SIGMOIDOSCOPY;  Surgeon: Alvis Jourdain, MD;  Location: [facility] ENDOSCOPY;  Service: Gastroenterology;  Laterality:  N/A;   HEMOSTASIS CLIP PLACEMENT  03/13/2019   Procedure: HEMOSTASIS CLIP PLACEMENT;  Surgeon: Nannette Babe, MD;  Location: [facility] ENDOSCOPY;  Service: Gastroenterology;;   HEMOSTASIS CLIP PLACEMENT  05/23/2022   Procedure: HEMOSTASIS CLIP PLACEMENT;  Surgeon: Alvis Jourdain, MD;  Location: [facility] ENDOSCOPY;  Service: Gastroenterology;;   HEMOSTASIS CONTROL  05/24/2022   Procedure: HEMOSTASIS CONTROL;  Surgeon: Daina Drum, MD;  Location: [facility] ENDOSCOPY;  Service: Gastroenterology;;   HOT HEMOSTASIS N/A 05/23/2022   Procedure: HOT HEMOSTASIS (ARGON PLASMA COAGULATION/BICAP);  Surgeon: Alvis Jourdain, MD;  Location: [facility] ENDOSCOPY;  Service: Gastroenterology;  Laterality: N/A;   I & D EXTREMITY Left 05/05/2022   Procedure: IRRIGATION AND DEBRIDEMENT LEFT ARM AV FISTULA;  Surgeon: Young Hensen, MD;  Location: [facility] OR;  Service: Vascular;  Laterality: Left;   I & D EXTREMITY N/A 11/14/2022   Procedure: IRRIGATION AND DEBRIDEMENT OF LOWER EXTREMITY WOUND;  Surgeon: Timothy Ford, MD;  Location: [facility]  OR;  Service: Orthopedics;  Laterality: N/A;   INSERTION OF DIALYSIS CATHETER Right 04/02/2022   Procedure: INSERTION OF TUNNELED DIALYSIS CATHETER;  Surgeon: Margherita Shell, MD;  Location: [facility] OR;  Service: Vascular;  Laterality: Right;   IR FLUORO GUIDE CV LINE RIGHT  08/10/2023   IR VENOCAVAGRAM IVC  08/10/2023   KNEE ARTHROSCOPY Left 10/25/2006   POLYPECTOMY  03/13/2019   Procedure: POLYPECTOMY;  Surgeon: Nannette Babe, MD;  Location: [facility] ENDOSCOPY;  Service: Gastroenterology;;   REMOVAL OF STONES  02/03/2022   Procedure: REMOVAL OF STONES;  Surgeon: Alvis Jourdain, MD;  Location: [facility] ENDOSCOPY;  Service: Gastroenterology;;   REVISON OF ARTERIOVENOUS FISTULA Left 08/20/2022   Procedure: REVISON OF LEFT ARM ARTERIOVENOUS FISTULA;  Surgeon: Margherita Shell, MD;  Location: [facility] OR;  Service: Vascular;  Laterality: Left;   RIGHT HEART CATH N/A 07/24/2017   Procedure: RIGHT HEART CATH;  Surgeon: Mardell Shade,  MD;  [facility]: [facility] LAB;  Service: Cardiovascular;  Laterality: N/A;   RIGHT HEART CATHETERIZATION N/A 09/22/2013   Procedure: RIGHT HEART CATH;  Surgeon: Mardell Shade, MD;  Location: [facility] CATH LAB;  Service: Cardiovascular;  Laterality: N/A;   SHOULDER ARTHROSCOPY WITH OPEN ROTATOR CUFF REPAIR Right 03/14/2014   Procedure: RIGHT SHOULDER ARTHROSCOPY WITH BICEPS RELEASE, OPEN SUBSCAPULA REPAIR, OPEN SUPRASPINATUS REPAIR.;  Surgeon: Jasmine Mesi, MD;  Location: [facility] OR;  Service: Orthopedics;  Laterality: Right;   SPHINCTEROTOMY  02/03/2022   Procedure: SPHINCTEROTOMY;  Surgeon: Alvis Jourdain, MD;  Location: [facility] ENDOSCOPY;  Service: Gastroenterology;;   STUMP REVISION Right 06/13/2022   Procedure: REVISION RIGHT BELOW KNEE AMPUTATION;  Surgeon: Timothy Ford, MD;  Location: [facility] OR;  Service: Orthopedics;  Laterality: Right;   TEE WITHOUT CARDIOVERSION N/A 02/04/2022   Procedure: TRANSESOPHAGEAL ECHOCARDIOGRAM (TEE);  Surgeon: Mardell Shade, MD;  Location: [facility] ENDOSCOPY;  Service: Cardiovascular;  Laterality: N/A;   THROMBECTOMY W/ EMBOLECTOMY Left 08/20/2022   Procedure: THROMBECTOMY OF LEFT ARM ARTERIOVENOUS FISTULA;  Surgeon: Margherita Shell, MD;  Location: [facility] OR;  Service: Vascular;  Laterality: Left;   TOE AMPUTATION Right 02/24/2018   GREAT TOE AND 2ND TOE AMPUTATION   TUBAL LIGATION  1970's   UPPER EXTREMITY VENOGRAPHY N/A 07/31/2023   Procedure: UPPER EXTREMITY VENOGRAPHY;  Surgeon: Carlene Che, MD;  Location: [facility];  Service: Cardiovascular;  Laterality: N/A;   Patient Active Problem List   Diagnosis Date Noted   UTI (urinary tract infection) 10/01/2023   Pyelonephritis 08/06/2023   Complicated UTI (urinary tract infection) 08/05/2023   Atrial fibrillation (HCC) 06/17/2023   Atopic dermatitis 06/17/2023   Hyperosmolar hyperglycemic state (HHS) (HCC) 02/05/2023   Gastroparesis 02/05/2023   Depression 01/29/2023   Intractable nausea and vomiting  01/20/2023   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 difficile colitis 06/05/2022   Spleen hematoma 06/05/2022   Dehiscence of amputation stump of right lower extremity (HCC) 06/05/2022   Rectal ulcer 05/27/2022   ESRD (end stage renal disease) (  HCC) 05/27/2022   GI bleed 05/23/2022   Difficult intravenous access 05/23/2022   Gangrene of right foot (HCC) 05/02/2022   S/P BKA (below knee amputation) unilateral, right (HCC) 05/02/2022   Unspecified protein-calorie malnutrition (HCC) 04/15/2022   Secondary hyperparathyroidism of renal origin (HCC) 04/14/2022   Coagulation defect, unspecified (HCC) 04/09/2022   Acquired absence of other left toe(s) (HCC) 04/07/2022   Allergy, unspecified, initial encounter 04/07/2022   Dependence on renal dialysis (HCC) 04/07/2022   Gout due to renal impairment, unspecified site 04/07/2022   Hypertensive heart and chronic kidney disease with heart failure and with stage 5 chronic kidney disease, or end stage renal disease (HCC) 04/07/2022   Personal history of transient ischemic attack (TIA), and cerebral infarction without residual deficits 04/07/2022   Renal osteodystrophy 04/07/2022   Venous stasis ulcer of right calf (HCC) 03/31/2022   Fistula, colovaginal 03/26/2022   Diarrhea 03/26/2022   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   Sepsis without acute organ dysfunction (HCC)    Bacteremia    Acute  pancreatitis 02/01/2022   Abdominal pain 02/01/2022   SIRS (systemic inflammatory response syndrome) (HCC) 02/01/2022   Transaminitis 02/01/2022   History of anemia due to chronic kidney disease 02/01/2022   Paroxysmal atrial fibrillation (HCC) 02/01/2022   Uncontrolled type 2 diabetes mellitus with hyperglycemia, with long-term current use of insulin  (HCC) 01/14/2022   NSTEMI (non-ST elevated myocardial infarction) (HCC) 03/05/2021   Acute renal failure superimposed on stage 4 chronic kidney disease (HCC) 08/22/2020   Hypoalbuminemia 05/25/2020   GERD (gastroesophageal reflux disease) 05/25/2020   Pressure injury of skin 05/17/2020   Acute on chronic combined systolic and diastolic congestive heart failure (HCC) 03/07/2020   Type 2 diabetes mellitus with diabetic polyneuropathy, with long-term current use of insulin  (HCC) 03/07/2020   Obesity, Class III, BMI 40-49.9 (morbid obesity) 03/07/2020   Common bile duct (CBD) obstruction 05/28/2019   Benign neoplasm of ascending colon    Benign neoplasm of transverse colon    Benign neoplasm of descending colon    Benign neoplasm of sigmoid colon    Gastric polyps    Hyperkalemia 03/11/2019   Prolonged QT interval 03/11/2019   Acute blood loss anemia 03/11/2019   Onychomycosis 06/21/2018   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)    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 (HCC)    PVD (peripheral vascular disease) (HCC) 10/26/2017   E-coli UTI 07/27/2017   Hypothyroidism 07/27/2017   AKI (acute kidney injury) (HCC)    PAH (pulmonary artery hypertension) (HCC)    Impaired ambulation 07/19/2017   Leg cramps 02/27/2017   Peripheral edema 01/12/2017   Diabetic neuropathy (HCC) 11/12/2016   Anemia of chronic disease 10/03/2015   Historical diagnosis of generalized anxiety disorder 10/03/2015   Secondary insomnia 10/03/2015   Acute bronchitis 09/05/2015   Hyperglycemia due to diabetes mellitus (HCC) 06/07/2015    Non compliance with medical treatment 04/17/2014   Rotator cuff tear 03/14/2014   Obesity 09/23/2013   Chronic HFrEF (heart failure with reduced ejection fraction) (HCC) 06/03/2013   Hypotension 12/25/2012   Hypokalemia 12/25/2012   Hyponatremia 12/25/2012   Urinary incontinence    MDD (major depressive disorder), recurrent episode, moderate (HCC) 11/12/2010   RBBB (right bundle branch block)    Wide-complex tachycardia    Coronary artery disease    Hyperlipemia 01/22/2009   Chronic hypotension 01/22/2009    ONSET DATE: 03/09/2023 MD referral to PT  REFERRING DIAG: S88.111D (ICD-10-CM) - Below-knee amputation of right  lower extremity, subsequent encounter   THERAPY DIAG:  Muscle weakness (generalized)  Other abnormalities of gait and mobility  Unsteadiness on feet  Phantom limb syndrome with pain (HCC)  Impaired functional mobility, balance, gait, and endurance  Rationale for Evaluation and Treatment: Rehabilitation  SUBJECTIVE:  SUBJECTIVE STATEMENT: She has been getting on BSC but constipation is limiting her ability to have a BM.    PERTINENT HISTORY: Right TTA 05/02/22, Lymphedema, idiopathic chronic venous HTN with ulcer, depression, colitis, diverticulitis, ESRD, gout, NSTEMI, DM2, polyneuropathy, PVD, CAD, right bundle branch block, mini strokes  PAIN:  Are you having pain?  Back pain today  7/10 Neck pain 6/10 Right shoulder 8/10   PRECAUTIONS: Fall and Other: No BP LUE  WEIGHT BEARING RESTRICTIONS: No  FALLS: Has patient fallen in last 6 months? No  LIVING ENVIRONMENT: Lives with: lives with their spouse and 2 small dogs Lives in: mobile home Home Access: Ramped entrance Home layout: One level Stairs: Yes: External: 6-7 steps; can reach both Has following equipment at home: Single point cane, Environmental consultant - 2 wheeled, Environmental consultant - 4 wheeled, Wheelchair (manual), [employer], Grab bars, and Ramped entry  OCCUPATION:  retired  PLOF: prior to amputation for  ~year used RW in home & community  PATIENT GOALS:  to use prosthesis to walk, get out w/c in & out of car, get to bathroom.   OBJECTIVE:  COGNITION: Overall cognitive status:  Eval on 03/23/2023:   Within functional limits for tasks assessed  POSTURE:  Eval on 03/23/2023:  rounded shoulders, forward head, increased thoracic kyphosis, flexed trunk , and weight shift left  LOWER EXTREMITY ROM: ROM Right eval Left eval  Hip flexion    Hip extension    Hip abduction    Hip adduction    Hip internal rotation    Hip external rotation    Knee flexion    Knee extension    Ankle dorsiflexion    Ankle plantarflexion    Ankle inversion    Ankle eversion     (Blank rows = not tested)  LOWER EXTREMITY MMT:  MMT Right eval Left eval Right 09/09/23 Left 09/09/23  Hip flexion 3-/5 3-/5 3/5 3/5  Hip extension 2/5 2/5 3-/5 3-/5  Hip abduction 2+/5 2+/5 3-/5 3-/5  Hip adduction      Hip internal rotation      Hip external rotation      Knee flexion 3-/5 3-/5 3/5 3/5  Knee extension 3-/5 3-/5 3/5 3/5  Ankle dorsiflexion      Ankle plantarflexion      Ankle inversion      Ankle eversion      At Evaluation all strength testing is grossly seated and functionally standing / gait. (Blank rows = not tested)  TRANSFERS: 09/07/2023: Scooting transfers with minA and sit to/from stand transfers with modA.   07/27/2023: -Sit>stand from wc with RW: MinA with heavy vc for sequencing and offweighting LE -Stand>sit wc with RW: CGA with patient performing safe controlled descent  06/15/2023: -Sit to stand transfers with RW wheelchair<>mat table at varied height: 19in with maxA to rise to stand; 22in ModA rise to stand, 24in min-modA rise to stand. -stand pivot transfer with RW w/c to mat table with modA once standing.  -Lateral scooting wc<>table at level height SBA; vc for LE positioning for optimal use of LE during transfer -lateral scoot R: modA due to fatigue and uneven surface transfer from  low surface to higher surface   05/04/2023: Squat  pivot transfers with modA.  04/21/2023: Pt sit to stand w/c to //bar with one hand pushing on w/c and other hand on //bar with modA first time & MinA 2nd/ 3rd time.  Best is RUE on //bar & LUE on w/c.  Stand to sit reaching LUE to w/c with minA to control descent.   PT demo & verbal cues on sit to/from stand w/c to RW.  1st time modA 2 person. 2nd time maxA one person.  Stand to sit with minA and constant cueing. Scooting transfers with minA with w/c & mat direct contact & same ht.   Eval on 03/23/2023:  Sit to stand: Max A (two-person assist) from 22" w/c to rolling walker Stand to sit: Max A (two-person assist) rolling walker to wheelchair  FUNCTIONAL TESTs:   09/09/2023: Patient able to tolerate static stance for 5 minutes with BUE support on RW with supervision.  This was current certification goal set so that patient can stand long enough for her husband to assist with cleaning her after toileting.  07/27/2023: Patient stood with RW SBA for 54sec before c/o lightheadedness and sitting down to toilet. Resisted nudges applied posteriorly to simulate hygienic cleaning. VC for weight shifts when LE become tired.  BP: after standing 79min54sec 161/82 HR 92  06/15/2023: -Static stance for standing with RW; requiring Mod-MaxA for rise to stand d/t patient unsteady on feet posterior lean without proper righting strategy, SBA for stand   04/21/2023: Pt able to stand 60 sec with BUE on //bars with supervision.  Pt able to stand for 1 min with RW support with minA.  Eval on 03/23/2023: Patient maintained upright holding rolling walker with mod assist 2 people for safety for 30 seconds.  GAIT: 09/09/2023: Patient ambulated 30' with RW including turning 90*to position to sit with mod assistance  07/27/2023: Patient ambulated 25ft with narrow bathroom entry and 90deg turn to sit on commode CGA with minA and heavy vc for progression and  sequencing of RW. While turning to sit on commode patient lost balance laterally requiring mod-maxA from therapist and patient use of grab bar to regain balance.   06/17/2023: Patient ambulated 76ft (maximal tolerated distance) with RW ModA (2 person for safety). Required VC for LE sequencing and minA to steer walker and prevent tip over.   04/21/2023: Pt amb 5' in //bars with modA (2 person for safety) with PT verbal & tactile cues on sequence and weight shift.  Pt amb 10' with RW with maxA (2 person for safety) with PT verbal & tactile cues on sequence and weight shift.  Eval on 03/23/2023:  Patient took 2 steps (one per LE) with +2 max assist with rolling walker and TTA prosthesis.  Patient adducting prosthesis stepping on left foot with no awareness.  CURRENT PROSTHETIC WEAR ASSESSMENT: 09/07/2023: Patient and husband are independent with: skin check, prosthetic cleaning, ply sock cleaning, proper wear schedule/adjustment, residual limb care, correct ply sock adjustment, and proper weight-bearing schedule/adjustment Pt reports wear most of awake hours on dialysis and non-dialysis days.  She wears prosthesis to dialysis now to assist with transfers prn and is able to verbalize proper adjustment of prosthesis weight for weigh-in & weigh-out.   06/17/2023:  Independence with prosthetic care:  Patient and husband are independent with: skin check, prosthetic cleaning, ply sock cleaning, proper wear schedule/adjustment Patient is dependent with: residual limb care, correct ply sock adjustment, and proper weight-bearing schedule/adjustment Donning prosthesis: husband is independent with proper donning. Patient requires  MaxA. Patient can verbally direct someone how to properly assist. Doffing prosthesis: Husband is independent with proper donning. Patient requires modA. Patient can verbally direct someone how to properly assist. Prosthetic wear tolerance: majority of awake hours including dialysis  hours, 1x/day, 3-4 days/week Prosthetic weight bearing tolerance: able to tolerate of standing without complains of prosthetic limb pain. Time not limited by limb discomfort but limited by muscle fatigue.   Eval on 03/23/2023:   Patient is dependent with: skin check, residual limb care, prosthetic cleaning, ply sock cleaning, correct ply sock adjustment, proper wear schedule/adjustment, and proper weight-bearing schedule/adjustment Donning prosthesis: Max A Doffing prosthesis: Min A Prosthetic wear tolerance: 2-6 hours, 1x/day, 3-4 days/week Prosthetic weight bearing tolerance: 3 minutes with limb pain Edema: pitting edema Residual limb condition: No open areas, dry skin, normal color and temperature, cylindrical shape Prosthetic description: Silicone liner with pin lock suspension, total contact socket with flexible inner socket, sach foot    TODAY'S TREATMENT:                                                                                                             DATE: 10/28/2023 Therapeutic Activities with Transtibial Prosthesis: -Pt  sit to stand w/c to RW with modA with PT cues. Stand to sit CGA with PT cues. 4 reps during session. -stand pivot transfer with RW 90* turn with minA.  Pt following directions better today.   -pt amb 26' on straight path with RW with minA.  -PT educated pt & husband in need for discussion with dialysis nurse or doctor prior to taking a laxative.  Both verbalized understanding.  -pt also educated on need for ongoing activities and exercises even after PT discharges to maintain her mobility & strength.    TREATMENT:                                                                                                             DATE: 10/26/2023 Therapeutic Activities with Transtibial Prosthesis: -Pt  sit to stand w/c to RW with mod-maxA. Stand to sit minA.  PT demo & verbal cues on technique.  She improved sit to stand to modA & stand to sit with minA to  CGA.  -stand pivot transfer with RW 90* turn with modA.  Pt following directions better today.   -pt amb 7' including 90* turn 1st rep to left and 2nd rep to right with mod-maxA.      TREATMENT:  DATE: 10/21/2023 Therapeutic Activities with Transtibial Prosthesis: -PT educated pt & husband on need to consistently try BSC for BMs.  Patient continues to have issues with having bowel movements in her Depends.  PT discussed how this increases risk of another UTI that could require hospitalization.  PT is recommending daily including on dialysis days that she attempt to have a bowel movement on the bedside commode by transferring there and sitting for at least 10 minutes.  Patient verbalizes understanding but then makes comments that makes PT question whether is going to be compliance. -pt sit to stand w/c to RW with modA including finding stable position upon arising;  pt able to stand with CGA for 2 min; stand to sit with minA to control descent. -Stand pivot transfer 90* with RW between wheelchair and 18 inch chair with armrest with modA and heavy verbal cues on technique.  Patient has greater difficulty transferring to the right towards the prosthetic limb then she does transferring to the left. -pt amb 15' with RW straight path with mod-maxA.      PATIENT EDUCATION: PATIENT EDUCATED ON FOLLOWING PROSTHETIC CARE: Education details: Use of stockinette under proximal liner to decrease itching sensation, no wounds presents to PT recommended not using Vive wear under liner Skin check, Prosthetic cleaning, Propper donning, and Proper wear schedule/adjustment Prosthetic wear tolerance: 3 hours 2x/day, 4 days/week Person educated: Patient and Spouse Education method: Explanation, Tactile cues, and Verbal cues Education comprehension: verbalized understanding, verbal cues required, tactile cues  required, and needs further education  HOME EXERCISE PROGRAM: Access Code: [email or web address] URL: [email or web address] Date: 09/23/2023 Prepared by: Lorie Rook  Exercises - Supine Bridge  - 1 x daily - 4 x weekly - 2 sets - 5 reps - 2 seconds hold - Supine Lower Trunk Rotation  - 1 x daily - 4 x weekly - 2 sets - 5 reps - 5 seconds hold - Supine Heel Slide with Strap  - 1 x daily - 4 x weekly - 2 sets - 5 reps - 2-3 seconds hold - Supine Hip Abduction  - 1 x daily - 4 x weekly - 2 sets - 5 reps - 2-3 seconds hold - Seated Hip Flexion Toward Target  - 1 x daily - 4 x weekly - 2 sets - 5 reps - 5 seconds hold - Seated Eccentric Abdominal Lean Back  - 1 x daily - 4 x weekly - 2 sets - 5 reps - 5 seconds hold - Seated Sidebending  - 1 x daily - 4 x weekly - 2 sets - 5 reps - 5 seconds hold - Seated Trunk Rotation with Crossed Arms  - 1 x daily - 4 x weekly - 2 sets - 5 reps - 5 seconds hold - Seated March  - 1 x daily - 4 x weekly - 2 sets - 10 reps - 5 seconds hold - Seated Knee Flexion Extension AROM   - 1 x daily - 4 x weekly - 2 sets - 10 reps - 5 seconds hold    ASSESSMENT:  CLINICAL IMPRESSION: patient required less assistance today but continues to require heavy cues.  Patient continues to benefit from skilled PT.  OBJECTIVE IMPAIRMENTS: Abnormal gait, decreased activity tolerance, decreased balance, decreased endurance, decreased knowledge of condition, decreased knowledge of use of DME, decreased mobility, difficulty walking, decreased ROM, decreased strength, decreased safety awareness, postural dysfunction, prosthetic dependency , and pain.   ACTIVITY LIMITATIONS: standing, transfers, and locomotion level  PARTICIPATION LIMITATIONS: community activity,  household mobility and dependency / burden of care on family  PERSONAL FACTORS: Age, Fitness, Past/current experiences, Time since onset of injury/illness/exacerbation, and 3+ comorbidities: see PMH are also affecting  patient's functional outcome.   REHAB POTENTIAL: Good  CLINICAL DECISION MAKING: Evolving/moderate complexity  EVALUATION COMPLEXITY: Moderate   GOALS: Goals reviewed with patient? Yes  SHORT TERM GOALS: Target date: 11/12/2023:  Patient & husband verbalized patient able to perform scooting transfer to / from [facility] and sit to/from stand [facility] to RW with husband's assistance safely. Baseline: SEE OBJECTIVE DATA Goal status:   MET  10/14/2023 2.  Patient able to sit to stand w/c to rolling walker with min assistance. Baseline: SEE OBJECTIVE DATA Goal status: Ongoing   10/26/2023  3. Patient ambulates 72ft including turning 90* to position to sit with RW & prosthesis with modA.  Baseline: Ongoing   10/26/2023  UPDATED LONG TERM GOALS: Target date: 12/03/2023  Patient & husband continues to demonstrate & verbalize understanding of prosthetic care and wears prosthesis >80% of awake hours daily to enable safe utilization of prosthesis. Baseline: SEE OBJECTIVE DATA Goal status: Ongoing   10/26/2023  Patient and husband report patient is able to stand to enable cleaning after toileting safely with no issues Baseline: SEE OBJECTIVE DATA Goal status: Ongoing  10/26/2023  Patient able to perform sit to/from stand transfers with minA.  Baseline: SEE OBJECTIVE DATA Goal status: Ongoing   10/26/2023  Patient ambulates >77ft including turning 90* to position to sit with prosthesis & RW with minA with husband's assistance safely. Baseline: SEE OBJECTIVE DATA Goal status: Ongoing  10/26/2023  Patient understanding of ongoing HEP. Baseline: SEE OBJECTIVE DATA Goal status:  Ongoing  10/26/2023  PLAN:  PT FREQUENCY: 2x/week  PT DURATION: 12 weeks  PLANNED INTERVENTIONS: 97164- PT Re-evaluation, 97110-Therapeutic exercises, 97530- Therapeutic activity, 97112- Neuromuscular re-education, [id number]- Self Care, 60454- Gait training, [phone]- Prosthetic training, Patient/Family education, Balance training, DME  instructions, Therapeutic exercises, Therapeutic activity, Neuromuscular re-education, Gait training, and Self Care  PLAN FOR NEXT SESSION:    Continue to encourage daily use of BSC.  Continue with standing & gait activities.    Arrionna Serena, PT, DPT 10/28/2023, 4:03 PM

## 2023-11-02 ENCOUNTER — Telehealth: Payer: Self-pay | Admitting: Physical Therapy

## 2023-11-02 ENCOUNTER — Ambulatory Visit: Admitting: Physical Therapy

## 2023-11-02 ENCOUNTER — Encounter: Payer: Self-pay | Admitting: Physical Therapy

## 2023-11-02 DIAGNOSIS — Z7409 Other reduced mobility: Secondary | ICD-10-CM | POA: Diagnosis not present

## 2023-11-02 DIAGNOSIS — R2681 Unsteadiness on feet: Secondary | ICD-10-CM | POA: Diagnosis not present

## 2023-11-02 DIAGNOSIS — M6281 Muscle weakness (generalized): Secondary | ICD-10-CM

## 2023-11-02 DIAGNOSIS — R2689 Other abnormalities of gait and mobility: Secondary | ICD-10-CM

## 2023-11-02 NOTE — Telephone Encounter (Signed)
 Susan [name] got her first prosthesis on 02/04/2023 which is over 6 months ago.  She has improved her overall mobility but is very limited by deconditioning / strength.  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  I do not think pads added to socket or socks will address socket fit. Her socket rotates during transfers & gait, then her weakness makes activity dangerous.     Can you please write an order for a socket revision with 2 new prosthetic liners?  She may need an office visit with you so need is in your office note. Corbin Dess

## 2023-11-02 NOTE — Therapy (Signed)
 OUTPATIENT PHYSICAL THERAPY PROSTHETIC TREATMENT & PROGRESS NOTE  Patient Name: Susan [name] MRN: [id number] DOB:1950/06/13, 73 y.o., female Today's Date: 11/02/2023  Progress Note Reporting Period  09/09/2023  to 11/02/2023  See note below for Objective Data and Assessment of Progress/Goals.   PCP: Austine Lefort, MD  REFERRING PROVIDER:  Gearldean Keepers, MD  END OF SESSION:  PT End of Session - 11/02/23 1335     Visit Number 46    Number of Visits 60    Date for PT Re-Evaluation 12/03/23    Authorization Type [employer]    Progress Note Due on Visit --    PT Start Time 1335    PT Stop Time 1428    PT Time Calculation (min) 53 min    Equipment Utilized During Treatment Gait belt    Activity Tolerance Patient tolerated treatment well;Patient limited by fatigue    Behavior During Therapy WFL for tasks assessed/performed                 Past Medical History:  Diagnosis Date   Anemia    hx   Anxiety    Arthritis    "generalized" (03/15/2014)   CAD (coronary artery disease)    MI in 2000 - MI  2007 - treated bare metal stent (no nuclear since then as 9/11)   Carotid artery disease (HCC)    Chronic diastolic heart failure (HCC)    a) ECHO (08/2013) EF 55-60% and RV function nl b) RHC (08/2013) RA 4, RV 30/5/7, PA 25/10 (16), PCWP 7, Fick CO/CI 6.3/2.7, PVR 1.5 WU, PA 61 and 66%   Daily headache    "~ every other day; since I fell in June" (03/15/2014)   Depression    Diabetic retinopathy (HCC)    Dyslipidemia    ESRD (end stage renal disease) (HCC)    Dialysis on Tues Thurs Sat   Exertional shortness of breath    GERD (gastroesophageal reflux disease)    History of blood transfusion    History of kidney stones    HTN (hypertension)    Hypothyroidism    Myocardial infarction (HCC)    Obesity    Osteoarthritis    PAF (paroxysmal atrial fibrillation) (HCC)    Peripheral neuropathy    bilateral feet/hands   PONV (postoperative nausea and  vomiting)    RBBB (right bundle branch block)    Old   Stroke (HCC)    mini strokes   Type II diabetes mellitus (HCC)    Type II, [name] libre left upper arm. patient has omnipod insulin  pump with Novolin R Insulin    Past Surgical History:  Procedure Laterality Date   A/V FISTULAGRAM Left 11/07/2022   Procedure: A/V Fistulagram;  Surgeon: Carlene Che, MD;  [facility]: [facility];  Service: Cardiovascular;  Laterality: Left;   ABDOMINAL HYSTERECTOMY  1980's   AMPUTATION Right 02/24/2018   Procedure: RIGHT FOOT GREAT TOE AND 2ND TOE AMPUTATION;  Surgeon: Timothy Ford, MD;  Location: [facility] OR;  Service: Orthopedics;  Laterality: Right;   AMPUTATION Right 04/30/2018   Procedure: RIGHT TRANSMETATARSAL AMPUTATION;  Surgeon: Timothy Ford, MD;  Location: [facility] OR;  Service: Orthopedics;  Laterality: Right;   AMPUTATION Right 05/02/2022   Procedure: RIGHT BELOW KNEE AMPUTATION;  Surgeon: Timothy Ford, MD;  Location: [facility] OR;  Service: Orthopedics;  Laterality: Right;   APPLICATION OF WOUND VAC Right 06/13/2022   Procedure: APPLICATION OF WOUND VAC;  Surgeon: Gearldean Keepers  V, MD;  Location: [facility] OR;  Service: Orthopedics;  Laterality: Right;   APPLICATION OF WOUND VAC Left 11/14/2022   Procedure: APPLICATION OF WOUND VAC;  Surgeon: Timothy Ford, MD;  [facility]: [facility] OR;  Service: Orthopedics;  Laterality: Left;   AV FISTULA PLACEMENT Left 04/02/2022   Procedure: LEFT ARM ARTERIOVENOUS (AV) FISTULA CREATION;  Surgeon: Margherita Shell, MD;  Location: [facility] OR;  Service: Vascular;  Laterality: Left;  PERIPHERAL NERVE BLOCK   AV FISTULA PLACEMENT Right 04/27/2023   Procedure: RIGHT ARM BRACHIOBASILIC ARTERIOVENOUS (AV) FISTULA CREATION;  Surgeon: Carlene Che, MD;  Location: [facility] OR;  Service: Vascular;  Laterality: Right;   BASCILIC VEIN TRANSPOSITION Left 07/31/2022   Procedure: LEFT ARM SECOND STAGE BASILIC VEIN TRANSPOSITION;  Surgeon: Margherita Shell, MD;  [facility]: [facility] OR;  Service: Vascular;   Laterality: Left;   BIOPSY  05/27/2020   Procedure: BIOPSY;  Surgeon: Vinetta Greening, DO;  [facility]: [facility];  Service: Endoscopy;;   CATARACT EXTRACTION, BILATERAL Bilateral ?2013   COLONOSCOPY W/ POLYPECTOMY     COLONOSCOPY WITH PROPOFOL  N/A 03/13/2019   Procedure: COLONOSCOPY WITH PROPOFOL ;  Surgeon: Nannette Babe, MD;  Location: [facility] ENDOSCOPY;  Service: Gastroenterology;  Laterality: N/A;   CORONARY ANGIOPLASTY WITH STENT PLACEMENT  1999; 2007   "1 + 1"   ERCP N/A 02/03/2022   Procedure: ENDOSCOPIC RETROGRADE CHOLANGIOPANCREATOGRAPHY (ERCP);  Surgeon: Alvis Jourdain, MD;  Location: [facility] ENDOSCOPY;  Service: Gastroenterology;  Laterality: N/A;   ESOPHAGOGASTRODUODENOSCOPY N/A 02/12/2023   Procedure: ESOPHAGOGASTRODUODENOSCOPY (EGD);  Surgeon: Alvis Jourdain, MD;  Location: [facility] ENDOSCOPY;  Service: Gastroenterology;  Laterality: N/A;   ESOPHAGOGASTRODUODENOSCOPY (EGD) WITH PROPOFOL  N/A 03/13/2019   Procedure: ESOPHAGOGASTRODUODENOSCOPY (EGD) WITH PROPOFOL ;  Surgeon: Nannette Babe, MD;  Location: [facility] ENDOSCOPY;  Service: Gastroenterology;  Laterality: N/A;   ESOPHAGOGASTRODUODENOSCOPY (EGD) WITH PROPOFOL  N/A 05/27/2020   Procedure: ESOPHAGOGASTRODUODENOSCOPY (EGD) WITH PROPOFOL ;  Surgeon: Vinetta Greening, DO;  [facility];  Service: Endoscopy;  Laterality: N/A;   ESOPHAGOGASTRODUODENOSCOPY (EGD) WITH PROPOFOL  N/A 09/03/2022   Procedure: ESOPHAGOGASTRODUODENOSCOPY (EGD) WITH PROPOFOL ;  Surgeon: Alvis Jourdain, MD;  Location: [facility] ENDOSCOPY;  Service: Gastroenterology;  Laterality: N/A;   EYE SURGERY Bilateral    lazer   FLEXIBLE SIGMOIDOSCOPY N/A 05/23/2022   Procedure: FLEXIBLE SIGMOIDOSCOPY;  Surgeon: Alvis Jourdain, MD;  Location: [facility] ENDOSCOPY;  Service: Gastroenterology;  Laterality: N/A;   FLEXIBLE SIGMOIDOSCOPY N/A 05/24/2022   Procedure: FLEXIBLE SIGMOIDOSCOPY;  Surgeon: Daina Drum, MD;  Location: [facility] ENDOSCOPY;  Service: Gastroenterology;  Laterality: N/A;   FLEXIBLE  SIGMOIDOSCOPY N/A 09/03/2022   Procedure: FLEXIBLE SIGMOIDOSCOPY;  Surgeon: Alvis Jourdain, MD;  Location: [facility] ENDOSCOPY;  Service: Gastroenterology;  Laterality: N/A;   HEMOSTASIS CLIP PLACEMENT  03/13/2019   Procedure: HEMOSTASIS CLIP PLACEMENT;  Surgeon: Nannette Babe, MD;  Location: [facility] ENDOSCOPY;  Service: Gastroenterology;;   HEMOSTASIS CLIP PLACEMENT  05/23/2022   Procedure: HEMOSTASIS CLIP PLACEMENT;  Surgeon: Alvis Jourdain, MD;  Location: [facility] ENDOSCOPY;  Service: Gastroenterology;;   HEMOSTASIS CONTROL  05/24/2022   Procedure: HEMOSTASIS CONTROL;  Surgeon: Daina Drum, MD;  Location: [facility] ENDOSCOPY;  Service: Gastroenterology;;   HOT HEMOSTASIS N/A 05/23/2022   Procedure: HOT HEMOSTASIS (ARGON PLASMA COAGULATION/BICAP);  Surgeon: Alvis Jourdain, MD;  Location: [facility] ENDOSCOPY;  Service: Gastroenterology;  Laterality: N/A;   I & D EXTREMITY Left 05/05/2022   Procedure: IRRIGATION AND DEBRIDEMENT LEFT ARM AV FISTULA;  Surgeon: Young Hensen, MD;  Location: [facility] OR;  Service: Vascular;  Laterality: Left;  I & D EXTREMITY N/A 11/14/2022   Procedure: IRRIGATION AND DEBRIDEMENT OF LOWER EXTREMITY WOUND;  Surgeon: Timothy Ford, MD;  Location: [facility] OR;  Service: Orthopedics;  Laterality: N/A;   INSERTION OF DIALYSIS CATHETER Right 04/02/2022   Procedure: INSERTION OF TUNNELED DIALYSIS CATHETER;  Surgeon: Margherita Shell, MD;  [facility];  Service: Vascular;  Laterality: Right;   IR FLUORO GUIDE CV LINE RIGHT  08/10/2023   IR VENOCAVAGRAM IVC  08/10/2023   KNEE ARTHROSCOPY Left 10/25/2006   POLYPECTOMY  03/13/2019   Procedure: POLYPECTOMY;  Surgeon: Nannette Babe, MD;  Location: [facility] ENDOSCOPY;  Service: Gastroenterology;;   REMOVAL OF STONES  02/03/2022   Procedure: REMOVAL OF STONES;  Surgeon: Alvis Jourdain, MD;  Location: [facility] ENDOSCOPY;  Service: Gastroenterology;;   REVISON OF ARTERIOVENOUS FISTULA Left 08/20/2022   Procedure: REVISON OF LEFT ARM ARTERIOVENOUS FISTULA;  Surgeon: Margherita Shell, MD;  [facility] OR;  Service: Vascular;  Laterality: Left;   RIGHT HEART CATH N/A 07/24/2017   Procedure: RIGHT HEART CATH;  Surgeon: Mardell Shade, MD;  [facility];  Service: Cardiovascular;  Laterality: N/A;   RIGHT HEART CATHETERIZATION N/A 09/22/2013   Procedure: RIGHT HEART CATH;  Surgeon: Mardell Shade, MD;  Location: [facility] CATH LAB;  Service: Cardiovascular;  Laterality: N/A;   SHOULDER ARTHROSCOPY WITH OPEN ROTATOR CUFF REPAIR Right 03/14/2014   Procedure: RIGHT SHOULDER ARTHROSCOPY WITH BICEPS RELEASE, OPEN SUBSCAPULA REPAIR, OPEN SUPRASPINATUS REPAIR.;  Surgeon: Jasmine Mesi, MD;  Location: [facility] OR;  Service: Orthopedics;  Laterality: Right;   SPHINCTEROTOMY  02/03/2022   Procedure: SPHINCTEROTOMY;  Surgeon: Alvis Jourdain, MD;  Location: [name] ENDOSCOPY;  Service: Gastroenterology;;   STUMP REVISION Right 06/13/2022   Procedure: REVISION RIGHT BELOW KNEE AMPUTATION;  Surgeon: Timothy Ford, MD;  Location: [facility] OR;  Service: Orthopedics;  Laterality: Right;   TEE WITHOUT CARDIOVERSION N/A 02/04/2022   Procedure: TRANSESOPHAGEAL ECHOCARDIOGRAM (TEE);  Surgeon: Mardell Shade, MD;  Location: [facility] ENDOSCOPY;  Service: Cardiovascular;  Laterality: N/A;   THROMBECTOMY W/ EMBOLECTOMY Left 08/20/2022   Procedure: THROMBECTOMY OF LEFT ARM ARTERIOVENOUS FISTULA;  Surgeon: Margherita Shell, MD;  Location: [facility] OR;  Service: Vascular;  Laterality: Left;   TOE AMPUTATION Right 02/24/2018   GREAT TOE AND 2ND TOE AMPUTATION   TUBAL LIGATION  1970's   UPPER EXTREMITY VENOGRAPHY N/A 07/31/2023   Procedure: UPPER EXTREMITY VENOGRAPHY;  Surgeon: Carlene Che, MD;  [facility]: [facility];  Service: Cardiovascular;  Laterality: N/A;   Patient Active Problem List   Diagnosis Date Noted   UTI (urinary tract infection) 10/01/2023   Pyelonephritis 08/06/2023   Complicated UTI (urinary tract infection) 08/05/2023   Atrial fibrillation (HCC) 06/17/2023   Atopic dermatitis  06/17/2023   Hyperosmolar hyperglycemic state (HHS) (HCC) 02/05/2023   Gastroparesis 02/05/2023   Depression 01/29/2023   Intractable nausea and vomiting 01/20/2023   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 difficile colitis 06/05/2022   Spleen hematoma  06/05/2022   Dehiscence of amputation stump of right lower extremity (HCC) 06/05/2022   Rectal ulcer 05/27/2022   ESRD (end stage renal disease) (HCC) 05/27/2022   GI bleed 05/23/2022   Difficult intravenous access 05/23/2022   Gangrene of right foot (HCC) 05/02/2022   S/P BKA (below knee amputation) unilateral, right (HCC) 05/02/2022   Unspecified protein-calorie malnutrition (HCC) 04/15/2022   Secondary hyperparathyroidism of renal origin (HCC) 04/14/2022   Coagulation defect, unspecified (HCC) 04/09/2022   Acquired absence of other left toe(s) (HCC) 04/07/2022   Allergy, unspecified, initial encounter 04/07/2022   Dependence on renal dialysis (HCC) 04/07/2022   Gout due to renal impairment, unspecified site 04/07/2022   Hypertensive heart and chronic kidney disease with heart failure and with stage 5 chronic kidney disease, or end stage renal disease (HCC) 04/07/2022   Personal history of transient ischemic attack (TIA), and cerebral infarction without residual deficits 04/07/2022   Renal osteodystrophy 04/07/2022   Venous stasis ulcer of right calf (HCC) 03/31/2022   Fistula,  colovaginal 03/26/2022   Diarrhea 03/26/2022   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   Sepsis without acute organ dysfunction (HCC)    Bacteremia    Acute pancreatitis 02/01/2022   Abdominal pain 02/01/2022   SIRS (systemic inflammatory response syndrome) (HCC) 02/01/2022   Transaminitis 02/01/2022   History of anemia due to chronic kidney disease 02/01/2022   Paroxysmal atrial fibrillation (HCC) 02/01/2022   Uncontrolled type 2 diabetes mellitus with hyperglycemia, with long-term current use of insulin  (HCC) 01/14/2022   NSTEMI (non-ST elevated myocardial infarction) (HCC) 03/05/2021   Acute renal failure superimposed on stage 4 chronic kidney disease (HCC) 08/22/2020   Hypoalbuminemia 05/25/2020   GERD (gastroesophageal reflux disease) 05/25/2020   Pressure injury of skin 05/17/2020   Acute on chronic combined systolic and diastolic congestive heart failure (HCC) 03/07/2020   Type 2 diabetes mellitus with diabetic polyneuropathy, with long-term current use of insulin  (HCC) 03/07/2020   Obesity, Class III, BMI 40-49.9 (morbid obesity) 03/07/2020   Common bile duct (CBD) obstruction 05/28/2019   Benign neoplasm of ascending colon    Benign neoplasm of transverse colon    Benign neoplasm of descending colon    Benign neoplasm of sigmoid colon    Gastric polyps    Hyperkalemia 03/11/2019   Prolonged QT interval 03/11/2019   Acute blood loss anemia 03/11/2019   Onychomycosis 06/21/2018   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)    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 (HCC)    PVD (peripheral vascular disease) (HCC) 10/26/2017   E-coli UTI 07/27/2017   Hypothyroidism 07/27/2017   AKI (acute kidney injury) (HCC)    PAH (pulmonary artery hypertension) (HCC)    Impaired ambulation 07/19/2017   Leg cramps 02/27/2017   Peripheral edema 01/12/2017   Diabetic neuropathy (HCC) 11/12/2016   Anemia of chronic disease 10/03/2015   Historical diagnosis of  generalized anxiety disorder 10/03/2015   Secondary insomnia 10/03/2015   Acute bronchitis 09/05/2015   Hyperglycemia due to diabetes mellitus (HCC) 06/07/2015   Non compliance with medical treatment 04/17/2014   Rotator cuff tear 03/14/2014   Obesity 09/23/2013   Chronic HFrEF (heart failure with reduced ejection fraction) (HCC) 06/03/2013   Hypotension 12/25/2012   Hypokalemia 12/25/2012   Hyponatremia 12/25/2012   Urinary incontinence    MDD (major depressive disorder), recurrent episode, moderate (HCC) 11/12/2010   RBBB (right bundle branch block)    Wide-complex tachycardia    Coronary artery disease    Hyperlipemia 01/22/2009  Chronic hypotension 01/22/2009    ONSET DATE: 03/09/2023 MD referral to PT  REFERRING DIAG: S88.111D (ICD-10-CM) - Below-knee amputation of right lower extremity, subsequent encounter   THERAPY DIAG:  Muscle weakness (generalized)  Other abnormalities of gait and mobility  Unsteadiness on feet  Impaired functional mobility, balance, gait, and endurance  Rationale for Evaluation and Treatment: Rehabilitation  SUBJECTIVE:  SUBJECTIVE STATEMENT: She got on BSC this weekend.  Her doctor gave her med for stool softner.    PERTINENT HISTORY: Right TTA 05/02/22, Lymphedema, idiopathic chronic venous HTN with ulcer, depression, colitis, diverticulitis, ESRD, gout, NSTEMI, DM2, polyneuropathy, PVD, CAD, right bundle branch block, mini strokes  PAIN:  Are you having pain?  Back pain today   7/10 Neck pain left side 4/10 & right side 6/10 Right shoulder 7/10  PRECAUTIONS: Fall and Other: No BP LUE  WEIGHT BEARING RESTRICTIONS: No  FALLS: Has patient fallen in last 6 months? No  LIVING ENVIRONMENT: Lives with: lives with their spouse and 2 small dogs Lives in: mobile home Home Access: Ramped entrance Home layout: One level Stairs: Yes: External: 6-7 steps; can reach both Has following equipment at home: Single point cane, Environmental consultant - 2 wheeled,  Environmental consultant - 4 wheeled, Wheelchair (manual), [employer], Grab bars, and Ramped entry  OCCUPATION:  retired  PLOF: prior to amputation for ~year used RW in home & community  PATIENT GOALS:  to use prosthesis to walk, get out w/c in & out of car, get to bathroom.   OBJECTIVE:  COGNITION: Overall cognitive status:  Eval on 03/23/2023:   Within functional limits for tasks assessed  POSTURE:  Eval on 03/23/2023:  rounded shoulders, forward head, increased thoracic kyphosis, flexed trunk , and weight shift left  LOWER EXTREMITY ROM: ROM Right eval Left eval  Hip flexion    Hip extension    Hip abduction    Hip adduction    Hip internal rotation    Hip external rotation    Knee flexion    Knee extension    Ankle dorsiflexion    Ankle plantarflexion    Ankle inversion    Ankle eversion     (Blank rows = not tested)  LOWER EXTREMITY MMT:  MMT Right eval Left eval Right 09/09/23 Left 09/09/23  Hip flexion 3-/5 3-/5 3/5 3/5  Hip extension 2/5 2/5 3-/5 3-/5  Hip abduction 2+/5 2+/5 3-/5 3-/5  Hip adduction      Hip internal rotation      Hip external rotation      Knee flexion 3-/5 3-/5 3/5 3/5  Knee extension 3-/5 3-/5 3/5 3/5  Ankle dorsiflexion      Ankle plantarflexion      Ankle inversion      Ankle eversion      At Evaluation all strength testing is grossly seated and functionally standing / gait. (Blank rows = not tested)  TRANSFERS: 09/07/2023: Scooting transfers with minA and sit to/from stand transfers with modA.   07/27/2023: -Sit>stand from wc with RW: MinA with heavy vc for sequencing and offweighting LE -Stand>sit wc with RW: CGA with patient performing safe controlled descent  06/15/2023: -Sit to stand transfers with RW wheelchair<>mat table at varied height: 19in with maxA to rise to stand; 22in ModA rise to stand, 24in min-modA rise to stand. -stand pivot transfer with RW w/c to mat table with modA once standing.  -Lateral scooting wc<>table at level  height SBA; vc for LE positioning for optimal use of LE during transfer -  lateral scoot R: modA due to fatigue and uneven surface transfer from low surface to higher surface   05/04/2023: Squat pivot transfers with modA.  04/21/2023: Pt sit to stand w/c to //bar with one hand pushing on w/c and other hand on //bar with modA first time & MinA 2nd/ 3rd time.  Best is RUE on //bar & LUE on w/c.  Stand to sit reaching LUE to w/c with minA to control descent.   PT demo & verbal cues on sit to/from stand w/c to RW.  1st time modA 2 person. 2nd time maxA one person.  Stand to sit with minA and constant cueing. Scooting transfers with minA with w/c & mat direct contact & same ht.   Eval on 03/23/2023:  Sit to stand: Max A (two-person assist) from 22" w/c to rolling walker Stand to sit: Max A (two-person assist) rolling walker to wheelchair  FUNCTIONAL TESTs:   09/09/2023: Patient able to tolerate static stance for 5 minutes with BUE support on RW with supervision.  This was current certification goal set so that patient can stand long enough for her husband to assist with cleaning her after toileting.  07/27/2023: Patient stood with RW SBA for 54sec before c/o lightheadedness and sitting down to toilet. Resisted nudges applied posteriorly to simulate hygienic cleaning. VC for weight shifts when LE become tired.  BP: after standing 67min54sec 161/82 HR 92  06/15/2023: -Static stance for standing with RW; requiring Mod-MaxA for rise to stand d/t patient unsteady on feet posterior lean without proper righting strategy, SBA for stand   04/21/2023: Pt able to stand 60 sec with BUE on //bars with supervision.  Pt able to stand for 1 min with RW support with minA.  Eval on 03/23/2023: Patient maintained upright holding rolling walker with mod assist 2 people for safety for 30 seconds.  GAIT: 09/09/2023: Patient ambulated 30' with RW including turning 90*to position to sit with mod  assistance  07/27/2023: Patient ambulated 75ft with narrow bathroom entry and 90deg turn to sit on commode CGA with minA and heavy vc for progression and sequencing of RW. While turning to sit on commode patient lost balance laterally requiring mod-maxA from therapist and patient use of grab bar to regain balance.   06/17/2023: Patient ambulated 4ft (maximal tolerated distance) with RW ModA (2 person for safety). Required VC for LE sequencing and minA to steer walker and prevent tip over.   04/21/2023: Pt amb 5' in //bars with modA (2 person for safety) with PT verbal & tactile cues on sequence and weight shift.  Pt amb 10' with RW with maxA (2 person for safety) with PT verbal & tactile cues on sequence and weight shift.  Eval on 03/23/2023:  Patient took 2 steps (one per LE) with +2 max assist with rolling walker and TTA prosthesis.  Patient adducting prosthesis stepping on left foot with no awareness.  CURRENT PROSTHETIC WEAR ASSESSMENT: 09/07/2023: Patient and husband are independent with: skin check, prosthetic cleaning, ply sock cleaning, proper wear schedule/adjustment, residual limb care, correct ply sock adjustment, and proper weight-bearing schedule/adjustment Pt reports wear most of awake hours on dialysis and non-dialysis days.  She wears prosthesis to dialysis now to assist with transfers prn and is able to verbalize proper adjustment of prosthesis weight for weigh-in & weigh-out.   06/17/2023:  Independence with prosthetic care:  Patient and husband are independent with: skin check, prosthetic cleaning, ply sock cleaning, proper wear schedule/adjustment Patient is dependent with:  residual limb care, correct ply sock adjustment, and proper weight-bearing schedule/adjustment Donning prosthesis: husband is independent with proper donning. Patient requires MaxA. Patient can verbally direct someone how to properly assist. Doffing prosthesis: Husband is independent with proper donning.  Patient requires modA. Patient can verbally direct someone how to properly assist. Prosthetic wear tolerance: majority of awake hours including dialysis hours, 1x/day, 3-4 days/week Prosthetic weight bearing tolerance: able to tolerate of standing without complains of prosthetic limb pain. Time not limited by limb discomfort but limited by muscle fatigue.   Eval on 03/23/2023:   Patient is dependent with: skin check, residual limb care, prosthetic cleaning, ply sock cleaning, correct ply sock adjustment, proper wear schedule/adjustment, and proper weight-bearing schedule/adjustment Donning prosthesis: Max A Doffing prosthesis: Min A Prosthetic wear tolerance: 2-6 hours, 1x/day, 3-4 days/week Prosthetic weight bearing tolerance: 3 minutes with limb pain Edema: pitting edema Residual limb condition: No open areas, dry skin, normal color and temperature, cylindrical shape Prosthetic description: Silicone liner with pin lock suspension, total contact socket with flexible inner socket, sach foot    TODAY'S TREATMENT:                                                                                                             DATE: 11/02/2023 Therapeutic Activities with Transtibial Prosthesis: Pt  sit to stand w/c to RW with modA with PT cues. Stand to sit CGA with PT cues. 4 reps during session. -stand pivot transfer with RW 90* turn with modA.  Pt following directions better today.   -pt amb 13' on straight path with RW with maxA 2 person.  -Her socket rotates on her residual limb with transfers & gait with severe negative impact on safety & mobility.  PT sent "telephone encounter" to Dr. Julio Ohm requesting prescription for socket revision with 2 new liners.  PT explained to pt & husband and they both agreed.    Self-Care: -pt became tearful at end of session because a 53yo friend who was also on dialysis died yesterday.  PT explained pt need for continuing dialysis and what generally happens if  she stops going.  Pt & husband verbalized understanding.     TREATMENT:                                                                                                             DATE: 10/28/2023 Therapeutic Activities with Transtibial Prosthesis: -Pt  sit to stand w/c to RW with modA with PT cues. Stand to sit CGA with PT cues. 4 reps during session. -stand pivot transfer with RW 90* turn  with minA.  Pt following directions better today.   -pt amb 26' on straight path with RW with minA.  -PT educated pt & husband in need for discussion with dialysis nurse or doctor prior to taking a laxative.  Both verbalized understanding.  -pt also educated on need for ongoing activities and exercises even after PT discharges to maintain her mobility & strength.    TREATMENT:                                                                                                             DATE: 10/26/2023 Therapeutic Activities with Transtibial Prosthesis: -Pt  sit to stand w/c to RW with mod-maxA. Stand to sit minA.  PT demo & verbal cues on technique.  She improved sit to stand to modA & stand to sit with minA to CGA.  -stand pivot transfer with RW 90* turn with modA.  Pt following directions better today.   -pt amb 7' including 90* turn 1st rep to left and 2nd rep to right with mod-maxA.     PATIENT EDUCATION: PATIENT EDUCATED ON FOLLOWING PROSTHETIC CARE: Education details: Use of stockinette under proximal liner to decrease itching sensation, no wounds presents to PT recommended not using Vive wear under liner Skin check, Prosthetic cleaning, Propper donning, and Proper wear schedule/adjustment Prosthetic wear tolerance: 3 hours 2x/day, 4 days/week Person educated: Patient and Spouse Education method: Explanation, Tactile cues, and Verbal cues Education comprehension: verbalized understanding, verbal cues required, tactile cues required, and needs further education  HOME EXERCISE PROGRAM: Access Code:  [email or web address] URL: [email or web address] Date: 09/23/2023 Prepared by: Lorie Rook  Exercises - Supine Bridge  - 1 x daily - 4 x weekly - 2 sets - 5 reps - 2 seconds hold - Supine Lower Trunk Rotation  - 1 x daily - 4 x weekly - 2 sets - 5 reps - 5 seconds hold - Supine Heel Slide with Strap  - 1 x daily - 4 x weekly - 2 sets - 5 reps - 2-3 seconds hold - Supine Hip Abduction  - 1 x daily - 4 x weekly - 2 sets - 5 reps - 2-3 seconds hold - Seated Hip Flexion Toward Target  - 1 x daily - 4 x weekly - 2 sets - 5 reps - 5 seconds hold - Seated Eccentric Abdominal Lean Back  - 1 x daily - 4 x weekly - 2 sets - 5 reps - 5 seconds hold - Seated Sidebending  - 1 x daily - 4 x weekly - 2 sets - 5 reps - 5 seconds hold - Seated Trunk Rotation with Crossed Arms  - 1 x daily - 4 x weekly - 2 sets - 5 reps - 5 seconds hold - Seated March  - 1 x daily - 4 x weekly - 2 sets - 10 reps - 5 seconds hold - Seated Knee Flexion Extension AROM   - 1 x daily - 4 x weekly - 2 sets - 10 reps - 5 seconds hold    ASSESSMENT:  CLINICAL IMPRESSION:  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  Her mobility waxes & weans.    OBJECTIVE IMPAIRMENTS: Abnormal gait, decreased activity tolerance, decreased balance, decreased endurance, decreased knowledge of condition, decreased knowledge of use of DME, decreased mobility, difficulty walking, decreased ROM, decreased strength, decreased safety awareness, postural dysfunction, prosthetic dependency , and pain.   ACTIVITY LIMITATIONS: standing, transfers, and locomotion level  PARTICIPATION LIMITATIONS: community activity, household mobility and dependency / burden of care on family  PERSONAL FACTORS: Age, Fitness, Past/current experiences, Time since onset of injury/illness/exacerbation, and 3+ comorbidities: see PMH are also affecting patient's functional outcome.   REHAB POTENTIAL:  Good  CLINICAL DECISION MAKING: Evolving/moderate complexity  EVALUATION COMPLEXITY: Moderate   GOALS: Goals reviewed with patient? Yes  SHORT TERM GOALS: Target date: 11/12/2023:  Patient & husband verbalized patient able to perform scooting transfer to / from [facility] and sit to/from stand [facility] to RW with husband's assistance safely. Baseline: SEE OBJECTIVE DATA Goal status:   MET  10/14/2023 2.  Patient able to sit to stand w/c to rolling walker with min assistance. Baseline: SEE OBJECTIVE DATA Goal status: Ongoing  11/02/2023  3. Patient ambulates 51ft including turning 90* to position to sit with RW & prosthesis with modA.  Baseline: Ongoing   11/02/2023  UPDATED LONG TERM GOALS: Target date: 12/03/2023  Patient & husband continues to demonstrate & verbalize understanding of prosthetic care and wears prosthesis >80% of awake hours daily to enable safe utilization of prosthesis. Baseline: SEE OBJECTIVE DATA Goal status: Ongoing   11/02/2023  Patient and husband report patient is able to stand to enable cleaning after toileting safely with no issues Baseline: SEE OBJECTIVE DATA Goal status: Ongoing  11/02/2023  Patient able to perform sit to/from stand transfers with minA.  Baseline: SEE OBJECTIVE DATA Goal status: Ongoing   11/02/2023  Patient ambulates >52ft including turning 90* to position to sit with prosthesis & RW with minA with husband's assistance safely. Baseline: SEE OBJECTIVE DATA Goal status: Ongoing  11/02/2023  Patient understanding of ongoing HEP. Baseline: SEE OBJECTIVE DATA Goal status:  Ongoing  11/02/2023  PLAN:  PT FREQUENCY: 2x/week  PT DURATION: 12 weeks  PLANNED INTERVENTIONS: 97164- PT Re-evaluation, 97110-Therapeutic exercises, 97530- Therapeutic activity, 97112- Neuromuscular re-education, [phone]- Self Care, 60454- Gait training, [phone]- Prosthetic training, Patient/Family education, Balance training, DME instructions, Therapeutic exercises, Therapeutic  activity, Neuromuscular re-education, Gait training, and Self Care  PLAN FOR NEXT SESSION:   check if Dr. Julio Ohm set up office visit to justify socket revision.  Continue to encourage daily use of BSC.  Continue with standing & gait activities.    Lorie Rook, PT, DPT 11/02/2023, 2:51 PM

## 2023-11-03 DIAGNOSIS — D689 Coagulation defect, unspecified: Secondary | ICD-10-CM | POA: Diagnosis not present

## 2023-11-03 DIAGNOSIS — Z992 Dependence on renal dialysis: Secondary | ICD-10-CM | POA: Diagnosis not present

## 2023-11-03 DIAGNOSIS — N2581 Secondary hyperparathyroidism of renal origin: Secondary | ICD-10-CM | POA: Diagnosis not present

## 2023-11-03 DIAGNOSIS — D631 Anemia in chronic kidney disease: Secondary | ICD-10-CM | POA: Diagnosis not present

## 2023-11-03 DIAGNOSIS — N186 End stage renal disease: Secondary | ICD-10-CM | POA: Diagnosis not present

## 2023-11-03 NOTE — Telephone Encounter (Signed)
 Order written as below and will fax to [facility].

## 2023-11-04 ENCOUNTER — Encounter: Payer: Self-pay | Admitting: Physical Therapy

## 2023-11-04 ENCOUNTER — Ambulatory Visit: Admitting: Physical Therapy

## 2023-11-04 DIAGNOSIS — Z7409 Other reduced mobility: Secondary | ICD-10-CM

## 2023-11-04 DIAGNOSIS — R2689 Other abnormalities of gait and mobility: Secondary | ICD-10-CM

## 2023-11-04 DIAGNOSIS — M6281 Muscle weakness (generalized): Secondary | ICD-10-CM

## 2023-11-04 DIAGNOSIS — R2681 Unsteadiness on feet: Secondary | ICD-10-CM

## 2023-11-04 DIAGNOSIS — G546 Phantom limb syndrome with pain: Secondary | ICD-10-CM | POA: Diagnosis not present

## 2023-11-04 NOTE — Therapy (Signed)
 OUTPATIENT PHYSICAL THERAPY PROSTHETIC TREATMENT  Patient Name: Susan [name] MRN: [id number] DOB:Jan 23, 1951, 73 y.o., female Today's Date: 11/04/2023  PCP: Austine Lefort, MD  REFERRING PROVIDER:  Gearldean Keepers, MD  END OF SESSION:  PT End of Session - 11/04/23 1348     Visit Number 47    Number of Visits 60    Date for PT Re-Evaluation 12/03/23    Authorization Type [employer]    PT Start Time 1345    PT Stop Time 1427    PT Time Calculation (min) 42 min    Equipment Utilized During Treatment Gait belt    Activity Tolerance Patient tolerated treatment well;Patient limited by fatigue    Behavior During Therapy WFL for tasks assessed/performed                  Past Medical History:  Diagnosis Date   Anemia    hx   Anxiety    Arthritis    generalized (03/15/2014)   CAD (coronary artery disease)    MI in 2000 - MI  2007 - treated bare metal stent (no nuclear since then as 9/11)   Carotid artery disease (HCC)    Chronic diastolic heart failure (HCC)    a) ECHO (08/2013) EF 55-60% and RV function nl b) RHC (08/2013) RA 4, RV 30/5/7, PA 25/10 (16), PCWP 7, Fick CO/CI 6.3/2.7, PVR 1.5 WU, PA 61 and 66%   Daily headache    ~ every other day; since I fell in June (03/15/2014)   Depression    Diabetic retinopathy (HCC)    Dyslipidemia    ESRD (end stage renal disease) (HCC)    Dialysis on Tues Thurs Sat   Exertional shortness of breath    GERD (gastroesophageal reflux disease)    History of blood transfusion    History of kidney stones    HTN (hypertension)    Hypothyroidism    Myocardial infarction (HCC)    Obesity    Osteoarthritis    PAF (paroxysmal atrial fibrillation) (HCC)    Peripheral neuropathy    bilateral feet/hands   PONV (postoperative nausea and vomiting)    RBBB (right bundle branch block)    Old   Stroke (HCC)    mini strokes   Type II diabetes mellitus (HCC)    Type II, [name] libre left upper arm. patient has omnipod  insulin  pump with Novolin R Insulin    Past Surgical History:  Procedure Laterality Date   A/V FISTULAGRAM Left 11/07/2022   Procedure: A/V Fistulagram;  Surgeon: Carlene Che, MD;  [facility]: [facility];  Service: Cardiovascular;  Laterality: Left;   ABDOMINAL HYSTERECTOMY  1980's   AMPUTATION Right 02/24/2018   Procedure: RIGHT FOOT GREAT TOE AND 2ND TOE AMPUTATION;  Surgeon: Timothy Ford, MD;  Location: [facility] OR;  Service: Orthopedics;  Laterality: Right;   AMPUTATION Right 04/30/2018   Procedure: RIGHT TRANSMETATARSAL AMPUTATION;  Surgeon: Timothy Ford, MD;  Location: [facility] OR;  Service: Orthopedics;  Laterality: Right;   AMPUTATION Right 05/02/2022   Procedure: RIGHT BELOW KNEE AMPUTATION;  Surgeon: Timothy Ford, MD;  Location: [facility] OR;  Service: Orthopedics;  Laterality: Right;   APPLICATION OF WOUND VAC Right 06/13/2022   Procedure: APPLICATION OF WOUND VAC;  Surgeon: Timothy Ford, MD;  [facility]: [facility] OR;  Service: Orthopedics;  Laterality: Right;   APPLICATION OF WOUND VAC Left 11/14/2022   Procedure: APPLICATION OF WOUND VAC;  Surgeon: Timothy Ford, MD;  [facility]: [facility] OR;  Service: Orthopedics;  Laterality: Left;   AV FISTULA PLACEMENT Left 04/02/2022   Procedure: LEFT ARM ARTERIOVENOUS (AV) FISTULA CREATION;  Surgeon: Margherita Shell, MD;  [facility]: [facility] OR;  Service: Vascular;  Laterality: Left;  PERIPHERAL NERVE BLOCK   AV FISTULA PLACEMENT Right 04/27/2023   Procedure: RIGHT ARM BRACHIOBASILIC ARTERIOVENOUS (AV) FISTULA CREATION;  Surgeon: Carlene Che, MD;  [facility]: [facility] OR;  Service: Vascular;  Laterality: Right;   BASCILIC VEIN TRANSPOSITION Left 07/31/2022   Procedure: LEFT ARM SECOND STAGE BASILIC VEIN TRANSPOSITION;  Surgeon: Margherita Shell, MD;  [facility];  Service: Vascular;  Laterality: Left;   BIOPSY  05/27/2020   Procedure: BIOPSY;  Surgeon: Vinetta Greening, DO;  [facility]: [facility];  Service: Endoscopy;;   CATARACT EXTRACTION, BILATERAL Bilateral ?2013    COLONOSCOPY W/ POLYPECTOMY     COLONOSCOPY WITH PROPOFOL  N/A 03/13/2019   Procedure: COLONOSCOPY WITH PROPOFOL ;  Surgeon: Nannette Babe, MD;  Location: [facility] ENDOSCOPY;  Service: Gastroenterology;  Laterality: N/A;   CORONARY ANGIOPLASTY WITH STENT PLACEMENT  1999; 2007   1 + 1   ERCP N/A 02/03/2022   Procedure: ENDOSCOPIC RETROGRADE CHOLANGIOPANCREATOGRAPHY (ERCP);  Surgeon: Alvis Jourdain, MD;  Location: [facility] ENDOSCOPY;  Service: Gastroenterology;  Laterality: N/A;   ESOPHAGOGASTRODUODENOSCOPY N/A 02/12/2023   Procedure: ESOPHAGOGASTRODUODENOSCOPY (EGD);  Surgeon: Alvis Jourdain, MD;  Location: [facility] ENDOSCOPY;  Service: Gastroenterology;  Laterality: N/A;   ESOPHAGOGASTRODUODENOSCOPY (EGD) WITH PROPOFOL  N/A 03/13/2019   Procedure: ESOPHAGOGASTRODUODENOSCOPY (EGD) WITH PROPOFOL ;  Surgeon: Nannette Babe, MD;  Location: [facility] ENDOSCOPY;  Service: Gastroenterology;  Laterality: N/A;   ESOPHAGOGASTRODUODENOSCOPY (EGD) WITH PROPOFOL  N/A 05/27/2020   Procedure: ESOPHAGOGASTRODUODENOSCOPY (EGD) WITH PROPOFOL ;  Surgeon: Vinetta Greening, DO;  [facility];  Service: Endoscopy;  Laterality: N/A;   ESOPHAGOGASTRODUODENOSCOPY (EGD) WITH PROPOFOL  N/A 09/03/2022   Procedure: ESOPHAGOGASTRODUODENOSCOPY (EGD) WITH PROPOFOL ;  Surgeon: Alvis Jourdain, MD;  Location: [facility] ENDOSCOPY;  Service: Gastroenterology;  Laterality: N/A;   EYE SURGERY Bilateral    lazer   FLEXIBLE SIGMOIDOSCOPY N/A 05/23/2022   Procedure: FLEXIBLE SIGMOIDOSCOPY;  Surgeon: Alvis Jourdain, MD;  Location: [facility] ENDOSCOPY;  Service: Gastroenterology;  Laterality: N/A;   FLEXIBLE SIGMOIDOSCOPY N/A 05/24/2022   Procedure: FLEXIBLE SIGMOIDOSCOPY;  Surgeon: Daina Drum, MD;  Location: [facility] ENDOSCOPY;  Service: Gastroenterology;  Laterality: N/A;   FLEXIBLE SIGMOIDOSCOPY N/A 09/03/2022   Procedure: FLEXIBLE SIGMOIDOSCOPY;  Surgeon: Alvis Jourdain, MD;  Location[facility] ENDOSCOPY;  Service: Gastroenterology;  Laterality: N/A;   HEMOSTASIS CLIP PLACEMENT   03/13/2019   Procedure: HEMOSTASIS CLIP PLACEMENT;  Surgeon: Nannette Babe, MD;  Location: [facility] ENDOSCOPY;  Service: Gastroenterology;;   HEMOSTASIS CLIP PLACEMENT  05/23/2022   Procedure: HEMOSTASIS CLIP PLACEMENT;  Surgeon: Alvis Jourdain, MD;  Location: [facility] ENDOSCOPY;  Service: Gastroenterology;;   HEMOSTASIS CONTROL  05/24/2022   Procedure: HEMOSTASIS CONTROL;  Surgeon: Daina Drum, MD;  Location: [facility] ENDOSCOPY;  Service: Gastroenterology;;   HOT HEMOSTASIS N/A 05/23/2022   Procedure: HOT HEMOSTASIS (ARGON PLASMA COAGULATION/BICAP);  Surgeon: Alvis Jourdain, MD;  Location: [facility] ENDOSCOPY;  Service: Gastroenterology;  Laterality: N/A;   I & D EXTREMITY Left 05/05/2022   Procedure: IRRIGATION AND DEBRIDEMENT LEFT ARM AV FISTULA;  Surgeon: Young Hensen, MD;  Location: [facility] OR;  Service: Vascular;  Laterality: Left;   I & D EXTREMITY N/A 11/14/2022   Procedure: IRRIGATION AND DEBRIDEMENT OF LOWER EXTREMITY WOUND;  Surgeon: Timothy Ford, MD;  [facility] OR;  Service: Orthopedics;  Laterality: N/A;  INSERTION OF DIALYSIS CATHETER Right 04/02/2022   Procedure: INSERTION OF TUNNELED DIALYSIS CATHETER;  Surgeon: Margherita Shell, MD;  [facility] OR;  Service: Vascular;  Laterality: Right;   IR FLUORO GUIDE CV LINE RIGHT  08/10/2023   IR VENOCAVAGRAM IVC  08/10/2023   KNEE ARTHROSCOPY Left 10/25/2006   POLYPECTOMY  03/13/2019   Procedure: POLYPECTOMY;  Surgeon: Nannette Babe, MD;  Location: [facility] ENDOSCOPY;  Service: Gastroenterology;;   REMOVAL OF STONES  02/03/2022   Procedure: REMOVAL OF STONES;  Surgeon: Alvis Jourdain, MD;  Location: [facility] ENDOSCOPY;  Service: Gastroenterology;;   REVISON OF ARTERIOVENOUS FISTULA Left 08/20/2022   Procedure: REVISON OF LEFT ARM ARTERIOVENOUS FISTULA;  Surgeon: Margherita Shell, MD;  [facility];  Service: Vascular;  Laterality: Left;   RIGHT HEART CATH N/A 07/24/2017   Procedure: RIGHT HEART CATH;  Surgeon: Mardell Shade, MD;  [facility];   Service: Cardiovascular;  Laterality: N/A;   RIGHT HEART CATHETERIZATION N/A 09/22/2013   Procedure: RIGHT HEART CATH;  Surgeon: Mardell Shade, MD;  Location: [facility] CATH LAB;  Service: Cardiovascular;  Laterality: N/A;   SHOULDER ARTHROSCOPY WITH OPEN ROTATOR CUFF REPAIR Right 03/14/2014   Procedure: RIGHT SHOULDER ARTHROSCOPY WITH BICEPS RELEASE, OPEN SUBSCAPULA REPAIR, OPEN SUPRASPINATUS REPAIR.;  Surgeon: Jasmine Mesi, MD;  Location: [facility] OR;  Service: Orthopedics;  Laterality: Right;   SPHINCTEROTOMY  02/03/2022   Procedure: SPHINCTEROTOMY;  Surgeon: Alvis Jourdain, MD;  Location: [facility] ENDOSCOPY;  Service: Gastroenterology;;   STUMP REVISION Right 06/13/2022   Procedure: REVISION RIGHT BELOW KNEE AMPUTATION;  Surgeon: Timothy Ford, MD;  Location: [facility] OR;  Service: Orthopedics;  Laterality: Right;   TEE WITHOUT CARDIOVERSION N/A 02/04/2022   Procedure: TRANSESOPHAGEAL ECHOCARDIOGRAM (TEE);  Surgeon: Mardell Shade, MD;  Location: [facility] ENDOSCOPY;  Service: Cardiovascular;  Laterality: N/A;   THROMBECTOMY W/ EMBOLECTOMY Left 08/20/2022   Procedure: THROMBECTOMY OF LEFT ARM ARTERIOVENOUS FISTULA;  Surgeon: Margherita Shell, MD;  Location: [facility] OR;  Service: Vascular;  Laterality: Left;   TOE AMPUTATION Right 02/24/2018   GREAT TOE AND 2ND TOE AMPUTATION   TUBAL LIGATION  1970's   UPPER EXTREMITY VENOGRAPHY N/A 07/31/2023   Procedure: UPPER EXTREMITY VENOGRAPHY;  Surgeon: Carlene Che, MD;  [facility];  Service: Cardiovascular;  Laterality: N/A;   Patient Active Problem List   Diagnosis Date Noted   UTI (urinary tract infection) 10/01/2023   Pyelonephritis 08/06/2023   Complicated UTI (urinary tract infection) 08/05/2023   Atrial fibrillation (HCC) 06/17/2023   Atopic dermatitis 06/17/2023   Hyperosmolar hyperglycemic state (HHS) (HCC) 02/05/2023   Gastroparesis 02/05/2023   Depression 01/29/2023   Intractable nausea and vomiting 01/20/2023   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 difficile colitis 06/05/2022   Spleen hematoma 06/05/2022   Dehiscence of amputation stump of right lower extremity (HCC) 06/05/2022   Rectal ulcer 05/27/2022   ESRD (end stage renal disease) (HCC) 05/27/2022   GI bleed 05/23/2022  Difficult intravenous access 05/23/2022   Gangrene of right foot (HCC) 05/02/2022   S/P BKA (below knee amputation) unilateral, right (HCC) 05/02/2022   Unspecified protein-calorie malnutrition (HCC) 04/15/2022   Secondary hyperparathyroidism of renal origin (HCC) 04/14/2022   Coagulation defect, unspecified (HCC) 04/09/2022   Acquired absence of other left toe(s) (HCC) 04/07/2022   Allergy, unspecified, initial encounter 04/07/2022   Dependence on renal dialysis (HCC) 04/07/2022   Gout due to renal impairment, unspecified site 04/07/2022   Hypertensive heart and chronic kidney disease with heart failure and with stage 5 chronic kidney disease, or end stage renal disease (HCC) 04/07/2022   Personal history of transient ischemic attack (TIA), and cerebral infarction without residual deficits 04/07/2022   Renal osteodystrophy 04/07/2022   Venous stasis ulcer of right calf (HCC) 03/31/2022   Fistula, colovaginal 03/26/2022   Diarrhea 03/26/2022   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   Sepsis without acute organ dysfunction (HCC)    Bacteremia    Acute pancreatitis 02/01/2022    Abdominal pain 02/01/2022   SIRS (systemic inflammatory response syndrome) (HCC) 02/01/2022   Transaminitis 02/01/2022   History of anemia due to chronic kidney disease 02/01/2022   Paroxysmal atrial fibrillation (HCC) 02/01/2022   Uncontrolled type 2 diabetes mellitus with hyperglycemia, with long-term current use of insulin  (HCC) 01/14/2022   NSTEMI (non-ST elevated myocardial infarction) (HCC) 03/05/2021   Acute renal failure superimposed on stage 4 chronic kidney disease (HCC) 08/22/2020   Hypoalbuminemia 05/25/2020   GERD (gastroesophageal reflux disease) 05/25/2020   Pressure injury of skin 05/17/2020   Acute on chronic combined systolic and diastolic congestive heart failure (HCC) 03/07/2020   Type 2 diabetes mellitus with diabetic polyneuropathy, with long-term current use of insulin  (HCC) 03/07/2020   Obesity, Class III, BMI 40-49.9 (morbid obesity) 03/07/2020   Common bile duct (CBD) obstruction 05/28/2019   Benign neoplasm of ascending colon    Benign neoplasm of transverse colon    Benign neoplasm of descending colon    Benign neoplasm of sigmoid colon    Gastric polyps    Hyperkalemia 03/11/2019   Prolonged QT interval 03/11/2019   Acute blood loss anemia 03/11/2019   Onychomycosis 06/21/2018   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)    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 (HCC)    PVD (peripheral vascular disease) (HCC) 10/26/2017   E-coli UTI 07/27/2017   Hypothyroidism 07/27/2017   AKI (acute kidney injury) (HCC)    PAH (pulmonary artery hypertension) (HCC)    Impaired ambulation 07/19/2017   Leg cramps 02/27/2017   Peripheral edema 01/12/2017   Diabetic neuropathy (HCC) 11/12/2016   Anemia of chronic disease 10/03/2015   Historical diagnosis of generalized anxiety disorder 10/03/2015   Secondary insomnia 10/03/2015   Acute bronchitis 09/05/2015   Hyperglycemia due to diabetes mellitus (HCC) 06/07/2015   Non compliance with medical  treatment 04/17/2014   Rotator cuff tear 03/14/2014   Obesity 09/23/2013   Chronic HFrEF (heart failure with reduced ejection fraction) (HCC) 06/03/2013   Hypotension 12/25/2012   Hypokalemia 12/25/2012   Hyponatremia 12/25/2012   Urinary incontinence    MDD (major depressive disorder), recurrent episode, moderate (HCC) 11/12/2010   RBBB (right bundle branch block)    Wide-complex tachycardia    Coronary artery disease    Hyperlipemia 01/22/2009   Chronic hypotension 01/22/2009    ONSET DATE: 03/09/2023 MD referral to PT  REFERRING DIAG: S88.111D (ICD-10-CM) - Below-knee amputation of right lower extremity, subsequent encounter   THERAPY DIAG:  Muscle weakness (generalized)  Other abnormalities of gait and mobility  Unsteadiness on feet  Impaired functional mobility, balance, gait, and endurance  Phantom limb syndrome with pain (HCC)  Rationale for Evaluation and Treatment: Rehabilitation  SUBJECTIVE:  SUBJECTIVE STATEMENT: She got on [facility] & had BM.    PERTINENT HISTORY: Right TTA 05/02/22, Lymphedema, idiopathic chronic venous HTN with ulcer, depression, colitis, diverticulitis, ESRD, gout, NSTEMI, DM2, polyneuropathy, PVD, CAD, right bundle branch block, mini strokes  PAIN:  Are you having pain?  Back pain today  7/10 Neck pain left side 4/10 & right side 6/10 Right shoulder 7/10  PRECAUTIONS: Fall and Other: No BP LUE  WEIGHT BEARING RESTRICTIONS: No  FALLS: Has patient fallen in last 6 months? No  LIVING ENVIRONMENT: Lives with: lives with their spouse and 2 small dogs Lives in: mobile home Home Access: Ramped entrance Home layout: One level Stairs: Yes: External: 6-7 steps; can reach both Has following equipment at home: Single point cane, Environmental consultant - 2 wheeled, Environmental consultant - 4 wheeled, Wheelchair (manual), [employer], Grab bars, and Ramped entry  OCCUPATION:  retired  PLOF: prior to amputation for ~year used RW in home & community  PATIENT GOALS:  to use  prosthesis to walk, get out w/c in & out of car, get to bathroom.   OBJECTIVE:  COGNITION: Overall cognitive status:  Eval on 03/23/2023:   Within functional limits for tasks assessed  POSTURE:  Eval on 03/23/2023:  rounded shoulders, forward head, increased thoracic kyphosis, flexed trunk , and weight shift left  LOWER EXTREMITY ROM: ROM Right eval Left eval  Hip flexion    Hip extension    Hip abduction    Hip adduction    Hip internal rotation    Hip external rotation    Knee flexion    Knee extension    Ankle dorsiflexion    Ankle plantarflexion    Ankle inversion    Ankle eversion     (Blank rows = not tested)  LOWER EXTREMITY MMT:  MMT Right eval Left eval Right 09/09/23 Left 09/09/23  Hip flexion 3-/5 3-/5 3/5 3/5  Hip extension 2/5 2/5 3-/5 3-/5  Hip abduction 2+/5 2+/5 3-/5 3-/5  Hip adduction      Hip internal rotation      Hip external rotation      Knee flexion 3-/5 3-/5 3/5 3/5  Knee extension 3-/5 3-/5 3/5 3/5  Ankle dorsiflexion      Ankle plantarflexion      Ankle inversion      Ankle eversion      At Evaluation all strength testing is grossly seated and functionally standing / gait. (Blank rows = not tested)  TRANSFERS: 09/07/2023: Scooting transfers with minA and sit to/from stand transfers with modA.   07/27/2023: -Sit>stand from wc with RW: MinA with heavy vc for sequencing and offweighting LE -Stand>sit wc with RW: CGA with patient performing safe controlled descent  06/15/2023: -Sit to stand transfers with RW wheelchair<>mat table at varied height: 19in with maxA to rise to stand; 22in ModA rise to stand, 24in min-modA rise to stand. -stand pivot transfer with RW w/c to mat table with modA once standing.  -Lateral scooting wc<>table at level height SBA; vc for LE positioning for optimal use of LE during transfer -lateral scoot R: modA due to fatigue and uneven surface transfer from low surface to higher surface   05/04/2023: Squat pivot  transfers with modA.  04/21/2023: Pt sit to stand w/c to //bar  with one hand pushing on w/c and other hand on //bar with modA first time & MinA 2nd/ 3rd time.  Best is RUE on //bar & LUE on w/c.  Stand to sit reaching LUE to w/c with minA to control descent.   PT demo & verbal cues on sit to/from stand w/c to RW.  1st time modA 2 person. 2nd time maxA one person.  Stand to sit with minA and constant cueing. Scooting transfers with minA with w/c & mat direct contact & same ht.   Eval on 03/23/2023:  Sit to stand: Max A (two-person assist) from 22 w/c to rolling walker Stand to sit: Max A (two-person assist) rolling walker to wheelchair  FUNCTIONAL TESTs:   09/09/2023: Patient able to tolerate static stance for 5 minutes with BUE support on RW with supervision.  This was current certification goal set so that patient can stand long enough for her husband to assist with cleaning her after toileting.  07/27/2023: Patient stood with RW SBA for 54sec before c/o lightheadedness and sitting down to toilet. Resisted nudges applied posteriorly to simulate hygienic cleaning. VC for weight shifts when LE become tired.  BP: after standing 22min54sec 161/82 HR 92  06/15/2023: -Static stance for standing with RW; requiring Mod-MaxA for rise to stand d/t patient unsteady on feet posterior lean without proper righting strategy, SBA for stand   04/21/2023: Pt able to stand 60 sec with BUE on //bars with supervision.  Pt able to stand for 1 min with RW support with minA.  Eval on 03/23/2023: Patient maintained upright holding rolling walker with mod assist 2 people for safety for 30 seconds.  GAIT: 09/09/2023: Patient ambulated 30' with RW including turning 90*to position to sit with mod assistance  07/27/2023: Patient ambulated 5ft with narrow bathroom entry and 90deg turn to sit on commode CGA with minA and heavy vc for progression and sequencing of RW. While turning to sit on commode patient  lost balance laterally requiring mod-maxA from therapist and patient use of grab bar to regain balance.   06/17/2023: Patient ambulated 67ft (maximal tolerated distance) with RW ModA (2 person for safety). Required VC for LE sequencing and minA to steer walker and prevent tip over.   04/21/2023: Pt amb 5' in //bars with modA (2 person for safety) with PT verbal & tactile cues on sequence and weight shift.  Pt amb 10' with RW with maxA (2 person for safety) with PT verbal & tactile cues on sequence and weight shift.  Eval on 03/23/2023:  Patient took 2 steps (one per LE) with +2 max assist with rolling walker and TTA prosthesis.  Patient adducting prosthesis stepping on left foot with no awareness.  CURRENT PROSTHETIC WEAR ASSESSMENT: 09/07/2023: Patient and husband are independent with: skin check, prosthetic cleaning, ply sock cleaning, proper wear schedule/adjustment, residual limb care, correct ply sock adjustment, and proper weight-bearing schedule/adjustment Pt reports wear most of awake hours on dialysis and non-dialysis days.  She wears prosthesis to dialysis now to assist with transfers prn and is able to verbalize proper adjustment of prosthesis weight for weigh-in & weigh-out.   06/17/2023:  Independence with prosthetic care:  Patient and husband are independent with: skin check, prosthetic cleaning, ply sock cleaning, proper wear schedule/adjustment Patient is dependent with: residual limb care, correct ply sock adjustment, and proper weight-bearing schedule/adjustment Donning prosthesis: husband is independent with proper donning. Patient requires MaxA. Patient can verbally direct someone how to properly assist. Doffing prosthesis: Husband  is independent with proper donning. Patient requires modA. Patient can verbally direct someone how to properly assist. Prosthetic wear tolerance: majority of awake hours including dialysis hours, 1x/day, 3-4 days/week Prosthetic weight bearing  tolerance: able to tolerate of standing without complains of prosthetic limb pain. Time not limited by limb discomfort but limited by muscle fatigue.   Eval on 03/23/2023:   Patient is dependent with: skin check, residual limb care, prosthetic cleaning, ply sock cleaning, correct ply sock adjustment, proper wear schedule/adjustment, and proper weight-bearing schedule/adjustment Donning prosthesis: Max A Doffing prosthesis: Min A Prosthetic wear tolerance: 2-6 hours, 1x/day, 3-4 days/week Prosthetic weight bearing tolerance: 3 minutes with limb pain Edema: pitting edema Residual limb condition: No open areas, dry skin, normal color and temperature, cylindrical shape Prosthetic description: Silicone liner with pin lock suspension, total contact socket with flexible inner socket, sach foot    TODAY'S TREATMENT:                                                                                                             DATE: 11/04/2023 Therapeutic Activities with Transtibial Prosthesis:  -PT informed pt & husband that Dr. Julio Ohm sent order to [facility] for socket revision.  PT recommended setting up appt.  PT reviewed cleaning liners and need to rotate between the 2 liners.  Pt & husband verbalized understanding.  -Pt  sit to stand w/c to RW with modA with PT cues. Stand to sit CGA with PT cues. 4 reps during session. -standing for 2 min with pelvic weight shift left/right and lifting contralateral LE for 2 sec 5 reps ea LE. -PT demo & verbal cues on weight shift to stance LE then stepping other LE.  Stand upright then advance RW so unweighting hands enough to move RW.   -pt amb 10' straight path with modA with constant PT cues for above.   -pt demo how to apply weight shifts for stand pivot transfers.  Patient was too fatigued by this point in the session to actually work on the skill.    TREATMENT:                                                                                                              DATE: 11/02/2023 Therapeutic Activities with Transtibial Prosthesis: -Pt  sit to stand w/c to RW with modA with PT cues. Stand to sit CGA with PT cues. 4 reps during session. -stand pivot transfer with RW 90* turn with modA.  Pt following directions better today.   -pt amb 13' on straight path with RW with maxA 2  person.  -Her socket rotates on her residual limb with transfers & gait with severe negative impact on safety & mobility.  PT sent telephone encounter to Dr. Julio Ohm requesting prescription for socket revision with 2 new liners.  PT explained to pt & husband and they both agreed.    Self-Care: -pt became tearful at end of session because a 53yo friend who was also on dialysis died yesterday.  PT explained pt need for continuing dialysis and what generally happens if she stops going.  Pt & husband verbalized understanding.     TREATMENT:                                                                                                             DATE: 10/28/2023 Therapeutic Activities with Transtibial Prosthesis: -Pt  sit to stand w/c to RW with modA with PT cues. Stand to sit CGA with PT cues. 4 reps during session. -stand pivot transfer with RW 90* turn with minA.  Pt following directions better today.   -pt amb 26' on straight path with RW with minA.  -PT educated pt & husband in need for discussion with dialysis nurse or doctor prior to taking a laxative.  Both verbalized understanding.  -pt also educated on need for ongoing activities and exercises even after PT discharges to maintain her mobility & strength.    TREATMENT:                                                                                                             DATE: 10/26/2023 Therapeutic Activities with Transtibial Prosthesis: -Pt  sit to stand w/c to RW with mod-maxA. Stand to sit minA.  PT demo & verbal cues on technique.  She improved sit to stand to modA & stand to sit with minA to CGA.  -stand pivot  transfer with RW 90* turn with modA.  Pt following directions better today.   -pt amb 7' including 90* turn 1st rep to left and 2nd rep to right with mod-maxA.     PATIENT EDUCATION: PATIENT EDUCATED ON FOLLOWING PROSTHETIC CARE: Education details: Use of stockinette under proximal liner to decrease itching sensation, no wounds presents to PT recommended not using Vive wear under liner Skin check, Prosthetic cleaning, Propper donning, and Proper wear schedule/adjustment Prosthetic wear tolerance: 3 hours 2x/day, 4 days/week Person educated: Patient and Spouse Education method: Explanation, Tactile cues, and Verbal cues Education comprehension: verbalized understanding, verbal cues required, tactile cues required, and needs further education  HOME EXERCISE PROGRAM: Access Code: [email or web address] URL: [email or web address] Date: 09/23/2023 Prepared by: Lorie Rook  Exercises -  Supine Bridge  - 1 x daily - 4 x weekly - 2 sets - 5 reps - 2 seconds hold - Supine Lower Trunk Rotation  - 1 x daily - 4 x weekly - 2 sets - 5 reps - 5 seconds hold - Supine Heel Slide with Strap  - 1 x daily - 4 x weekly - 2 sets - 5 reps - 2-3 seconds hold - Supine Hip Abduction  - 1 x daily - 4 x weekly - 2 sets - 5 reps - 2-3 seconds hold - Seated Hip Flexion Toward Target  - 1 x daily - 4 x weekly - 2 sets - 5 reps - 5 seconds hold - Seated Eccentric Abdominal Lean Back  - 1 x daily - 4 x weekly - 2 sets - 5 reps - 5 seconds hold - Seated Sidebending  - 1 x daily - 4 x weekly - 2 sets - 5 reps - 5 seconds hold - Seated Trunk Rotation with Crossed Arms  - 1 x daily - 4 x weekly - 2 sets - 5 reps - 5 seconds hold - Seated March  - 1 x daily - 4 x weekly - 2 sets - 10 reps - 5 seconds hold - Seated Knee Flexion Extension AROM   - 1 x daily - 4 x weekly - 2 sets - 10 reps - 5 seconds hold    ASSESSMENT:  CLINICAL IMPRESSION: Patient did improved her ability to ambulate with strong focus on weight shift to  stance lower extremity.  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  OBJECTIVE IMPAIRMENTS: Abnormal gait, decreased activity tolerance, decreased balance, decreased endurance, decreased knowledge of condition, decreased knowledge of use of DME, decreased mobility, difficulty walking, decreased ROM, decreased strength, decreased safety awareness, postural dysfunction, prosthetic dependency , and pain.   ACTIVITY LIMITATIONS: standing, transfers, and locomotion level  PARTICIPATION LIMITATIONS: community activity, household mobility and dependency / burden of care on family  PERSONAL FACTORS: Age, Fitness, Past/current experiences, Time since onset of injury/illness/exacerbation, and 3+ comorbidities: see PMH are also affecting patient's functional outcome.   REHAB POTENTIAL: Good  CLINICAL DECISION MAKING: Evolving/moderate complexity  EVALUATION COMPLEXITY: Moderate   GOALS: Goals reviewed with patient? Yes  SHORT TERM GOALS: Target date: 11/12/2023:  Patient & husband verbalized patient able to perform scooting transfer to / from [facility] and sit to/from stand [facility] to RW with husband's assistance safely. Baseline: SEE OBJECTIVE DATA Goal status:   MET  10/14/2023 2.  Patient able to sit to stand w/c to rolling walker with min assistance. Baseline: SEE OBJECTIVE DATA Goal status: Ongoing  11/02/2023  3. Patient ambulates 72ft including turning 90* to position to sit with RW & prosthesis with modA.  Baseline: Ongoing   11/02/2023  UPDATED LONG TERM GOALS: Target date: 12/03/2023  Patient & husband continues to demonstrate & verbalize understanding of prosthetic care and wears prosthesis >80% of awake hours daily to enable safe utilization of prosthesis. Baseline: SEE OBJECTIVE DATA Goal status: Ongoing   11/02/2023  Patient and husband report patient is able to stand to enable cleaning after toileting safely with no  issues Baseline: SEE OBJECTIVE DATA Goal status: Ongoing  11/02/2023  Patient able to perform sit to/from stand transfers with minA.  Baseline: SEE OBJECTIVE DATA Goal status: Ongoing   11/02/2023  Patient ambulates >69ft including turning 90* to position to sit with prosthesis & RW with minA with husband's assistance  safely. Baseline: SEE OBJECTIVE DATA Goal status: Ongoing  11/02/2023  Patient understanding of ongoing HEP. Baseline: SEE OBJECTIVE DATA Goal status:  Ongoing  11/02/2023  PLAN:  PT FREQUENCY: 2x/week  PT DURATION: 12 weeks  PLANNED INTERVENTIONS: 97164- PT Re-evaluation, 97110-Therapeutic exercises, 97530- Therapeutic activity, 97112- Neuromuscular re-education, [phone]- Self Care, 96295- Gait training, [phone]- Prosthetic training, Patient/Family education, Balance training, DME instructions, Therapeutic exercises, Therapeutic activity, Neuromuscular re-education, Gait training, and Self Care  PLAN FOR NEXT SESSION: Work towards LTG's.  Continue to encourage daily use of BSC.  Continue with standing & gait activities.    Joanann Mies, PT, DPT 11/04/2023, 4:32 PM

## 2023-11-06 ENCOUNTER — Ambulatory Visit: Payer: PPO | Admitting: Cardiovascular Disease

## 2023-11-09 ENCOUNTER — Encounter: Payer: Self-pay | Admitting: Physical Therapy

## 2023-11-09 ENCOUNTER — Encounter (HOSPITAL_COMMUNITY)

## 2023-11-09 ENCOUNTER — Ambulatory Visit: Admitting: Physical Therapy

## 2023-11-09 DIAGNOSIS — R2689 Other abnormalities of gait and mobility: Secondary | ICD-10-CM | POA: Diagnosis not present

## 2023-11-09 DIAGNOSIS — G546 Phantom limb syndrome with pain: Secondary | ICD-10-CM | POA: Diagnosis not present

## 2023-11-09 DIAGNOSIS — R2681 Unsteadiness on feet: Secondary | ICD-10-CM

## 2023-11-09 DIAGNOSIS — M6281 Muscle weakness (generalized): Secondary | ICD-10-CM | POA: Diagnosis not present

## 2023-11-09 DIAGNOSIS — Z7409 Other reduced mobility: Secondary | ICD-10-CM | POA: Diagnosis not present

## 2023-11-09 NOTE — Telephone Encounter (Signed)
 Done

## 2023-11-09 NOTE — Therapy (Signed)
 OUTPATIENT PHYSICAL THERAPY PROSTHETIC TREATMENT  Patient Name: Susan [name] MRN: [id number] DOB:May 01, 1951, 73 y.o., female Today's Date: 11/09/2023  PCP: Austine Lefort, MD  REFERRING PROVIDER:  Gearldean Keepers, MD  END OF SESSION:  PT End of Session - 11/09/23 1349     Visit Number 48    Number of Visits 60    Date for PT Re-Evaluation 12/03/23    Authorization Type [employer]    PT Start Time 1345    PT Stop Time 1426    PT Time Calculation (min) 41 min    Equipment Utilized During Treatment Gait belt    Activity Tolerance Patient tolerated treatment well;Patient limited by fatigue    Behavior During Therapy WFL for tasks assessed/performed                Past Medical History:  Diagnosis Date   Anemia    hx   Anxiety    Arthritis    generalized (03/15/2014)   CAD (coronary artery disease)    MI in 2000 - MI  2007 - treated bare metal stent (no nuclear since then as 9/11)   Carotid artery disease (HCC)    Chronic diastolic heart failure (HCC)    a) ECHO (08/2013) EF 55-60% and RV function nl b) RHC (08/2013) RA 4, RV 30/5/7, PA 25/10 (16), PCWP 7, Fick CO/CI 6.3/2.7, PVR 1.5 WU, PA 61 and 66%   Daily headache    ~ every other day; since I fell in June (03/15/2014)   Depression    Diabetic retinopathy (HCC)    Dyslipidemia    ESRD (end stage renal disease) (HCC)    Dialysis on Tues Thurs Sat   Exertional shortness of breath    GERD (gastroesophageal reflux disease)    History of blood transfusion    History of kidney stones    HTN (hypertension)    Hypothyroidism    Myocardial infarction (HCC)    Obesity    Osteoarthritis    PAF (paroxysmal atrial fibrillation) (HCC)    Peripheral neuropathy    bilateral feet/hands   PONV (postoperative nausea and vomiting)    RBBB (right bundle branch block)    Old   Stroke (HCC)    mini strokes   Type II diabetes mellitus (HCC)    Type II, [name] libre left upper arm. patient has omnipod  insulin  pump with Novolin R Insulin    Past Surgical History:  Procedure Laterality Date   A/V FISTULAGRAM Left 11/07/2022   Procedure: A/V Fistulagram;  Surgeon: Carlene Che, MD;  [facility]: [facility];  Service: Cardiovascular;  Laterality: Left;   ABDOMINAL HYSTERECTOMY  1980's   AMPUTATION Right 02/24/2018   Procedure: RIGHT FOOT GREAT TOE AND 2ND TOE AMPUTATION;  Surgeon: Timothy Ford, MD;  Location: [facility] OR;  Service: Orthopedics;  Laterality: Right;   AMPUTATION Right 04/30/2018   Procedure: RIGHT TRANSMETATARSAL AMPUTATION;  Surgeon: Timothy Ford, MD;  Location: [facility] OR;  Service: Orthopedics;  Laterality: Right;   AMPUTATION Right 05/02/2022   Procedure: RIGHT BELOW KNEE AMPUTATION;  Surgeon: Timothy Ford, MD;  Location: [facility] OR;  Service: Orthopedics;  Laterality: Right;   APPLICATION OF WOUND VAC Right 06/13/2022   Procedure: APPLICATION OF WOUND VAC;  Surgeon: Timothy Ford, MD;  Location: [facility] OR;  Service: Orthopedics;  Laterality: Right;   APPLICATION OF WOUND VAC Left 11/14/2022   Procedure: APPLICATION OF WOUND VAC;  Surgeon: Timothy Ford, MD;  [facility] OR;  Service: Orthopedics;  Laterality: Left;   AV FISTULA PLACEMENT Left 04/02/2022   Procedure: LEFT ARM ARTERIOVENOUS (AV) FISTULA CREATION;  Surgeon: Margherita Shell, MD;  Location: [facility] OR;  Service: Vascular;  Laterality: Left;  PERIPHERAL NERVE BLOCK   AV FISTULA PLACEMENT Right 04/27/2023   Procedure: RIGHT ARM BRACHIOBASILIC ARTERIOVENOUS (AV) FISTULA CREATION;  Surgeon: Carlene Che, MD;  [facility]: [facility];  Service: Vascular;  Laterality: Right;   BASCILIC VEIN TRANSPOSITION Left 07/31/2022   Procedure: LEFT ARM SECOND STAGE BASILIC VEIN TRANSPOSITION;  Surgeon: Margherita Shell, MD;  [facility]: [facility] OR;  Service: Vascular;  Laterality: Left;   BIOPSY  05/27/2020   Procedure: BIOPSY;  Surgeon: Vinetta Greening, DO;  Location: [facility];  Service: Endoscopy;;   CATARACT EXTRACTION, BILATERAL Bilateral ?2013    COLONOSCOPY W/ POLYPECTOMY     COLONOSCOPY WITH PROPOFOL  N/A 03/13/2019   Procedure: COLONOSCOPY WITH PROPOFOL ;  Surgeon: Nannette Babe, MD;  Location: [facility] ENDOSCOPY;  Service: Gastroenterology;  Laterality: N/A;   CORONARY ANGIOPLASTY WITH STENT PLACEMENT  1999; 2007   1 + 1   ERCP N/A 02/03/2022   Procedure: ENDOSCOPIC RETROGRADE CHOLANGIOPANCREATOGRAPHY (ERCP);  Surgeon: Alvis Jourdain, MD;  Location: [facility] ENDOSCOPY;  Service: Gastroenterology;  Laterality: N/A;   ESOPHAGOGASTRODUODENOSCOPY N/A 02/12/2023   Procedure: ESOPHAGOGASTRODUODENOSCOPY (EGD);  Surgeon: Alvis Jourdain, MD;  Location: [facility] ENDOSCOPY;  Service: Gastroenterology;  Laterality: N/A;   ESOPHAGOGASTRODUODENOSCOPY (EGD) WITH PROPOFOL  N/A 03/13/2019   Procedure: ESOPHAGOGASTRODUODENOSCOPY (EGD) WITH PROPOFOL ;  Surgeon: Nannette Babe, MD;  Location: [facility] ENDOSCOPY;  Service: Gastroenterology;  Laterality: N/A;   ESOPHAGOGASTRODUODENOSCOPY (EGD) WITH PROPOFOL  N/A 05/27/2020   Procedure: ESOPHAGOGASTRODUODENOSCOPY (EGD) WITH PROPOFOL ;  Surgeon: Vinetta Greening, DO;  [facility];  Service: Endoscopy;  Laterality: N/A;   ESOPHAGOGASTRODUODENOSCOPY (EGD) WITH PROPOFOL  N/A 09/03/2022   Procedure: ESOPHAGOGASTRODUODENOSCOPY (EGD) WITH PROPOFOL ;  Surgeon: Alvis Jourdain, MD;  Location: [facility] ENDOSCOPY;  Service: Gastroenterology;  Laterality: N/A;   EYE SURGERY Bilateral    lazer   FLEXIBLE SIGMOIDOSCOPY N/A 05/23/2022   Procedure: FLEXIBLE SIGMOIDOSCOPY;  Surgeon: Alvis Jourdain, MD;  Location: [facility] ENDOSCOPY;  Service: Gastroenterology;  Laterality: N/A;   FLEXIBLE SIGMOIDOSCOPY N/A 05/24/2022   Procedure: FLEXIBLE SIGMOIDOSCOPY;  Surgeon: Daina Drum, MD;  Location: [facility] ENDOSCOPY;  Service: Gastroenterology;  Laterality: N/A;   FLEXIBLE SIGMOIDOSCOPY N/A 09/03/2022   Procedure: FLEXIBLE SIGMOIDOSCOPY;  Surgeon: Alvis Jourdain, MD;  Location: [facility] ENDOSCOPY;  Service: Gastroenterology;  Laterality: N/A;   HEMOSTASIS CLIP PLACEMENT   03/13/2019   Procedure: HEMOSTASIS CLIP PLACEMENT;  Surgeon: Nannette Babe, MD;  Location: [facility] ENDOSCOPY;  Service: Gastroenterology;;   HEMOSTASIS CLIP PLACEMENT  05/23/2022   Procedure: HEMOSTASIS CLIP PLACEMENT;  Surgeon: Alvis Jourdain, MD;  Location: [facility] ENDOSCOPY;  Service: Gastroenterology;;   HEMOSTASIS CONTROL  05/24/2022   Procedure: HEMOSTASIS CONTROL;  Surgeon: Daina Drum, MD;  Location: [facility] ENDOSCOPY;  Service: Gastroenterology;;   HOT HEMOSTASIS N/A 05/23/2022   Procedure: HOT HEMOSTASIS (ARGON PLASMA COAGULATION/BICAP);  Surgeon: Alvis Jourdain, MD;  Location: [facility] ENDOSCOPY;  Service: Gastroenterology;  Laterality: N/A;   I & D EXTREMITY Left 05/05/2022   Procedure: IRRIGATION AND DEBRIDEMENT LEFT ARM AV FISTULA;  Surgeon: Young Hensen, MD;  Location: [facility] OR;  Service: Vascular;  Laterality: Left;   I & D EXTREMITY N/A 11/14/2022   Procedure: IRRIGATION AND DEBRIDEMENT OF LOWER EXTREMITY WOUND;  Surgeon: Timothy Ford, MD;  [facility] OR;  Service: Orthopedics;  Laterality: N/A;  INSERTION OF DIALYSIS CATHETER Right 04/02/2022   Procedure: INSERTION OF TUNNELED DIALYSIS CATHETER;  Surgeon: Margherita Shell, MD;  [facility];  Service: Vascular;  Laterality: Right;   IR FLUORO GUIDE CV LINE RIGHT  08/10/2023   IR VENOCAVAGRAM IVC  08/10/2023   KNEE ARTHROSCOPY Left 10/25/2006   POLYPECTOMY  03/13/2019   Procedure: POLYPECTOMY;  Surgeon: Nannette Babe, MD;  Location: [facility] ENDOSCOPY;  Service: Gastroenterology;;   REMOVAL OF STONES  02/03/2022   Procedure: REMOVAL OF STONES;  Surgeon: Alvis Jourdain, MD;  Location: [facility] ENDOSCOPY;  Service: Gastroenterology;;   REVISON OF ARTERIOVENOUS FISTULA Left 08/20/2022   Procedure: REVISON OF LEFT ARM ARTERIOVENOUS FISTULA;  Surgeon: Margherita Shell, MD;  [facility];  Service: Vascular;  Laterality: Left;   RIGHT HEART CATH N/A 07/24/2017   Procedure: RIGHT HEART CATH;  Surgeon: Mardell Shade, MD;  [facility];   Service: Cardiovascular;  Laterality: N/A;   RIGHT HEART CATHETERIZATION N/A 09/22/2013   Procedure: RIGHT HEART CATH;  Surgeon: Mardell Shade, MD;  Location: [facility] CATH LAB;  Service: Cardiovascular;  Laterality: N/A;   SHOULDER ARTHROSCOPY WITH OPEN ROTATOR CUFF REPAIR Right 03/14/2014   Procedure: RIGHT SHOULDER ARTHROSCOPY WITH BICEPS RELEASE, OPEN SUBSCAPULA REPAIR, OPEN SUPRASPINATUS REPAIR.;  Surgeon: Jasmine Mesi, MD;  Location: [facility] OR;  Service: Orthopedics;  Laterality: Right;   SPHINCTEROTOMY  02/03/2022   Procedure: SPHINCTEROTOMY;  Surgeon: Alvis Jourdain, MD;  Location: [facility] ENDOSCOPY;  Service: Gastroenterology;;   STUMP REVISION Right 06/13/2022   Procedure: REVISION RIGHT BELOW KNEE AMPUTATION;  Surgeon: Timothy Ford, MD;  Location: [facility] OR;  Service: Orthopedics;  Laterality: Right;   TEE WITHOUT CARDIOVERSION N/A 02/04/2022   Procedure: TRANSESOPHAGEAL ECHOCARDIOGRAM (TEE);  Surgeon: Mardell Shade, MD;  Location: [facility] ENDOSCOPY;  Service: Cardiovascular;  Laterality: N/A;   THROMBECTOMY W/ EMBOLECTOMY Left 08/20/2022   Procedure: THROMBECTOMY OF LEFT ARM ARTERIOVENOUS FISTULA;  Surgeon: Margherita Shell, MD;  Location: [facility] OR;  Service: Vascular;  Laterality: Left;   TOE AMPUTATION Right 02/24/2018   GREAT TOE AND 2ND TOE AMPUTATION   TUBAL LIGATION  1970's   UPPER EXTREMITY VENOGRAPHY N/A 07/31/2023   Procedure: UPPER EXTREMITY VENOGRAPHY;  Surgeon: Carlene Che, MD;  [facility];  Service: Cardiovascular;  Laterality: N/A;   Patient Active Problem List   Diagnosis Date Noted   UTI (urinary tract infection) 10/01/2023   Pyelonephritis 08/06/2023   Complicated UTI (urinary tract infection) 08/05/2023   Atrial fibrillation (HCC) 06/17/2023   Atopic dermatitis 06/17/2023   Hyperosmolar hyperglycemic state (HHS) (HCC) 02/05/2023   Gastroparesis 02/05/2023   Depression 01/29/2023   Intractable nausea and vomiting 01/20/2023   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 difficile colitis 06/05/2022   Spleen hematoma 06/05/2022   Dehiscence of amputation stump of right lower extremity (HCC) 06/05/2022   Rectal ulcer 05/27/2022   ESRD (end stage renal disease) (HCC) 05/27/2022   GI bleed 05/23/2022  Difficult intravenous access 05/23/2022   Gangrene of right foot (HCC) 05/02/2022   S/P BKA (below knee amputation) unilateral, right (HCC) 05/02/2022   Unspecified protein-calorie malnutrition (HCC) 04/15/2022   Secondary hyperparathyroidism of renal origin (HCC) 04/14/2022   Coagulation defect, unspecified (HCC) 04/09/2022   Acquired absence of other left toe(s) (HCC) 04/07/2022   Allergy, unspecified, initial encounter 04/07/2022   Dependence on renal dialysis (HCC) 04/07/2022   Gout due to renal impairment, unspecified site 04/07/2022   Hypertensive heart and chronic kidney disease with heart failure and with stage 5 chronic kidney disease, or end stage renal disease (HCC) 04/07/2022   Personal history of transient ischemic attack (TIA), and cerebral infarction without residual deficits 04/07/2022   Renal osteodystrophy 04/07/2022   Venous stasis ulcer of right calf (HCC) 03/31/2022   Fistula, colovaginal 03/26/2022   Diarrhea 03/26/2022   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   Sepsis without acute organ dysfunction (HCC)    Bacteremia    Acute pancreatitis 02/01/2022    Abdominal pain 02/01/2022   SIRS (systemic inflammatory response syndrome) (HCC) 02/01/2022   Transaminitis 02/01/2022   History of anemia due to chronic kidney disease 02/01/2022   Paroxysmal atrial fibrillation (HCC) 02/01/2022   Uncontrolled type 2 diabetes mellitus with hyperglycemia, with long-term current use of insulin  (HCC) 01/14/2022   NSTEMI (non-ST elevated myocardial infarction) (HCC) 03/05/2021   Acute renal failure superimposed on stage 4 chronic kidney disease (HCC) 08/22/2020   Hypoalbuminemia 05/25/2020   GERD (gastroesophageal reflux disease) 05/25/2020   Pressure injury of skin 05/17/2020   Acute on chronic combined systolic and diastolic congestive heart failure (HCC) 03/07/2020   Type 2 diabetes mellitus with diabetic polyneuropathy, with long-term current use of insulin  (HCC) 03/07/2020   Obesity, Class III, BMI 40-49.9 (morbid obesity) 03/07/2020   Common bile duct (CBD) obstruction 05/28/2019   Benign neoplasm of ascending colon    Benign neoplasm of transverse colon    Benign neoplasm of descending colon    Benign neoplasm of sigmoid colon    Gastric polyps    Hyperkalemia 03/11/2019   Prolonged QT interval 03/11/2019   Acute blood loss anemia 03/11/2019   Onychomycosis 06/21/2018   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)    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 (HCC)    PVD (peripheral vascular disease) (HCC) 10/26/2017   E-coli UTI 07/27/2017   Hypothyroidism 07/27/2017   AKI (acute kidney injury) (HCC)    PAH (pulmonary artery hypertension) (HCC)    Impaired ambulation 07/19/2017   Leg cramps 02/27/2017   Peripheral edema 01/12/2017   Diabetic neuropathy (HCC) 11/12/2016   Anemia of chronic disease 10/03/2015   Historical diagnosis of generalized anxiety disorder 10/03/2015   Secondary insomnia 10/03/2015   Acute bronchitis 09/05/2015   Hyperglycemia due to diabetes mellitus (HCC) 06/07/2015   Non compliance with medical  treatment 04/17/2014   Rotator cuff tear 03/14/2014   Obesity 09/23/2013   Chronic HFrEF (heart failure with reduced ejection fraction) (HCC) 06/03/2013   Hypotension 12/25/2012   Hypokalemia 12/25/2012   Hyponatremia 12/25/2012   Urinary incontinence    MDD (major depressive disorder), recurrent episode, moderate (HCC) 11/12/2010   RBBB (right bundle branch block)    Wide-complex tachycardia    Coronary artery disease    Hyperlipemia 01/22/2009   Chronic hypotension 01/22/2009    ONSET DATE: 03/09/2023 MD referral to PT  REFERRING DIAG: S88.111D (ICD-10-CM) - Below-knee amputation of right lower extremity, subsequent encounter   THERAPY DIAG:  Muscle weakness (generalized)  Other abnormalities of gait and mobility  Unsteadiness on feet  Impaired functional mobility, balance, gait, and endurance  Phantom limb syndrome with pain (HCC)  Rationale for Evaluation and Treatment: Rehabilitation  SUBJECTIVE:  SUBJECTIVE STATEMENT: She has appt with prosthetist on 11/25/23.  She has been getting on [facility] daily and has BM most days.  She is having less BM in her diaper.    PERTINENT HISTORY: Right TTA 05/02/22, Lymphedema, idiopathic chronic venous HTN with ulcer, depression, colitis, diverticulitis, ESRD, gout, NSTEMI, DM2, polyneuropathy, PVD, CAD, right bundle branch block, mini strokes  PAIN:  Are you having pain?  Back pain today   6/10 Neck pain left side 4610 & right side 7/10 Right shoulder 7/10  PRECAUTIONS: Fall and Other: No BP LUE  WEIGHT BEARING RESTRICTIONS: No  FALLS: Has patient fallen in last 6 months? No  LIVING ENVIRONMENT: Lives with: lives with their spouse and 2 small dogs Lives in: mobile home Home Access: Ramped entrance Home layout: One level Stairs: Yes: External: 6-7 steps; can reach both Has following equipment at home: Single point cane, Environmental consultant - 2 wheeled, Environmental consultant - 4 wheeled, Wheelchair (manual), [employer], Grab bars, and Ramped  entry  OCCUPATION:  retired  PLOF: prior to amputation for ~year used RW in home & community  PATIENT GOALS:  to use prosthesis to walk, get out w/c in & out of car, get to bathroom.   OBJECTIVE:  COGNITION: Overall cognitive status:  Eval on 03/23/2023:   Within functional limits for tasks assessed  POSTURE:  Eval on 03/23/2023:  rounded shoulders, forward head, increased thoracic kyphosis, flexed trunk , and weight shift left  LOWER EXTREMITY ROM: ROM Right eval Left eval  Hip flexion    Hip extension    Hip abduction    Hip adduction    Hip internal rotation    Hip external rotation    Knee flexion    Knee extension    Ankle dorsiflexion    Ankle plantarflexion    Ankle inversion    Ankle eversion     (Blank rows = not tested)  LOWER EXTREMITY MMT:  MMT Right eval Left eval Right 09/09/23 Left 09/09/23  Hip flexion 3-/5 3-/5 3/5 3/5  Hip extension 2/5 2/5 3-/5 3-/5  Hip abduction 2+/5 2+/5 3-/5 3-/5  Hip adduction      Hip internal rotation      Hip external rotation      Knee flexion 3-/5 3-/5 3/5 3/5  Knee extension 3-/5 3-/5 3/5 3/5  Ankle dorsiflexion      Ankle plantarflexion      Ankle inversion      Ankle eversion      At Evaluation all strength testing is grossly seated and functionally standing / gait. (Blank rows = not tested)  TRANSFERS: 09/07/2023: Scooting transfers with minA and sit to/from stand transfers with modA.   07/27/2023: -Sit>stand from wc with RW: MinA with heavy vc for sequencing and offweighting LE -Stand>sit wc with RW: CGA with patient performing safe controlled descent  06/15/2023: -Sit to stand transfers with RW wheelchair<>mat table at varied height: 19in with maxA to rise to stand; 22in ModA rise to stand, 24in min-modA rise to stand. -stand pivot transfer with RW w/c to mat table with modA once standing.  -Lateral scooting wc<>table at level height SBA; vc for LE positioning for optimal use of LE during transfer -lateral  scoot R: modA due to fatigue and uneven surface transfer  from low surface to higher surface   05/04/2023: Squat pivot transfers with modA.  04/21/2023: Pt sit to stand w/c to //bar with one hand pushing on w/c and other hand on //bar with modA first time & MinA 2nd/ 3rd time.  Best is RUE on //bar & LUE on w/c.  Stand to sit reaching LUE to w/c with minA to control descent.   PT demo & verbal cues on sit to/from stand w/c to RW.  1st time modA 2 person. 2nd time maxA one person.  Stand to sit with minA and constant cueing. Scooting transfers with minA with w/c & mat direct contact & same ht.   Eval on 03/23/2023:  Sit to stand: Max A (two-person assist) from 22 w/c to rolling walker Stand to sit: Max A (two-person assist) rolling walker to wheelchair  FUNCTIONAL TESTs:   09/09/2023: Patient able to tolerate static stance for 5 minutes with BUE support on RW with supervision.  This was current certification goal set so that patient can stand long enough for her husband to assist with cleaning her after toileting.  07/27/2023: Patient stood with RW SBA for 54sec before c/o lightheadedness and sitting down to toilet. Resisted nudges applied posteriorly to simulate hygienic cleaning. VC for weight shifts when LE become tired.  BP: after standing 35min54sec 161/82 HR 92  06/15/2023: -Static stance for standing with RW; requiring Mod-MaxA for rise to stand d/t patient unsteady on feet posterior lean without proper righting strategy, SBA for stand   04/21/2023: Pt able to stand 60 sec with BUE on //bars with supervision.  Pt able to stand for 1 min with RW support with minA.  Eval on 03/23/2023: Patient maintained upright holding rolling walker with mod assist 2 people for safety for 30 seconds.  GAIT: 09/09/2023: Patient ambulated 30' with RW including turning 90*to position to sit with mod assistance  07/27/2023: Patient ambulated 24ft with narrow bathroom entry and 90deg turn to  sit on commode CGA with minA and heavy vc for progression and sequencing of RW. While turning to sit on commode patient lost balance laterally requiring mod-maxA from therapist and patient use of grab bar to regain balance.   06/17/2023: Patient ambulated 32ft (maximal tolerated distance) with RW ModA (2 person for safety). Required VC for LE sequencing and minA to steer walker and prevent tip over.   04/21/2023: Pt amb 5' in //bars with modA (2 person for safety) with PT verbal & tactile cues on sequence and weight shift.  Pt amb 10' with RW with maxA (2 person for safety) with PT verbal & tactile cues on sequence and weight shift.  Eval on 03/23/2023:  Patient took 2 steps (one per LE) with +2 max assist with rolling walker and TTA prosthesis.  Patient adducting prosthesis stepping on left foot with no awareness.  CURRENT PROSTHETIC WEAR ASSESSMENT: 09/07/2023: Patient and husband are independent with: skin check, prosthetic cleaning, ply sock cleaning, proper wear schedule/adjustment, residual limb care, correct ply sock adjustment, and proper weight-bearing schedule/adjustment Pt reports wear most of awake hours on dialysis and non-dialysis days.  She wears prosthesis to dialysis now to assist with transfers prn and is able to verbalize proper adjustment of prosthesis weight for weigh-in & weigh-out.   06/17/2023:  Independence with prosthetic care:  Patient and husband are independent with: skin check, prosthetic cleaning, ply sock cleaning, proper wear schedule/adjustment Patient is dependent with: residual limb care, correct ply sock adjustment, and proper weight-bearing schedule/adjustment  Donning prosthesis: husband is independent with proper donning. Patient requires MaxA. Patient can verbally direct someone how to properly assist. Doffing prosthesis: Husband is independent with proper donning. Patient requires modA. Patient can verbally direct someone how to properly assist. Prosthetic  wear tolerance: majority of awake hours including dialysis hours, 1x/day, 3-4 days/week Prosthetic weight bearing tolerance: able to tolerate of standing without complains of prosthetic limb pain. Time not limited by limb discomfort but limited by muscle fatigue.   Eval on 03/23/2023:   Patient is dependent with: skin check, residual limb care, prosthetic cleaning, ply sock cleaning, correct ply sock adjustment, proper wear schedule/adjustment, and proper weight-bearing schedule/adjustment Donning prosthesis: Max A Doffing prosthesis: Min A Prosthetic wear tolerance: 2-6 hours, 1x/day, 3-4 days/week Prosthetic weight bearing tolerance: 3 minutes with limb pain Edema: pitting edema Residual limb condition: No open areas, dry skin, normal color and temperature, cylindrical shape Prosthetic description: Silicone liner with pin lock suspension, total contact socket with flexible inner socket, sach foot    TODAY'S TREATMENT:                                                                                                             DATE: 11/09/2023 Therapeutic Activities with Transtibial Prosthesis:  -Sit to stand with modA 3 reps & minA 1 rep with PT cues on technique. -PT demo & verbal cues on stand pivot transfer with RW for weight shift to stance LE.  Pt able to transfer to left & to right with minA once upright but requires verbal cues for each movement.   -PT demo & verbal cues on pelvic weight shift to stance LE.  Pt amb 23' with RW with minA but constant cues.  Pt's husband following with w/c so straight path for gait.    Self-Care: -PT simulated and verbal cues on cleaning privates after using BSC and dressing lower body.  Pt & husband verbalized understanding.     TREATMENT:                                                                                                             DATE: 11/04/2023 Therapeutic Activities with Transtibial Prosthesis:  -PT informed pt & husband  that Dr. Julio Ohm sent order to [facility] for socket revision.  PT recommended setting up appt.  PT reviewed cleaning liners and need to rotate between the 2 liners.  Pt & husband verbalized understanding.  -Pt  sit to stand w/c to RW with modA with PT cues. Stand to sit CGA with PT cues. 4 reps during session. -standing for 2 min with  pelvic weight shift left/right and lifting contralateral LE for 2 sec 5 reps ea LE. -PT demo & verbal cues on weight shift to stance LE then stepping other LE.  Stand upright then advance RW so unweighting hands enough to move RW.   -pt amb 10' straight path with modA with constant PT cues for above.   -pt demo how to apply weight shifts for stand pivot transfers.  Patient was too fatigued by this point in the session to actually work on the skill.    TREATMENT:                                                                                                             DATE: 11/02/2023 Therapeutic Activities with Transtibial Prosthesis: -Pt  sit to stand w/c to RW with modA with PT cues. Stand to sit CGA with PT cues. 4 reps during session. -stand pivot transfer with RW 90* turn with modA.  Pt following directions better today.   -pt amb 13' on straight path with RW with maxA 2 person.  -Her socket rotates on her residual limb with transfers & gait with severe negative impact on safety & mobility.  PT sent telephone encounter to Dr. Julio Ohm requesting prescription for socket revision with 2 new liners.  PT explained to pt & husband and they both agreed.    Self-Care: -pt became tearful at end of session because a 53yo friend who was also on dialysis died yesterday.  PT explained pt need for continuing dialysis and what generally happens if she stops going.  Pt & husband verbalized understanding.     TREATMENT:                                                                                                             DATE: 10/28/2023 Therapeutic Activities with Transtibial  Prosthesis: -Pt  sit to stand w/c to RW with modA with PT cues. Stand to sit CGA with PT cues. 4 reps during session. -stand pivot transfer with RW 90* turn with minA.  Pt following directions better today.   -pt amb 26' on straight path with RW with minA.  -PT educated pt & husband in need for discussion with dialysis nurse or doctor prior to taking a laxative.  Both verbalized understanding.  -pt also educated on need for ongoing activities and exercises even after PT discharges to maintain her mobility & strength.     PATIENT EDUCATION: PATIENT EDUCATED ON FOLLOWING PROSTHETIC CARE: Education details: Use of stockinette under proximal liner to decrease itching sensation, no wounds presents to PT recommended not  using Vive wear under liner Skin check, Prosthetic cleaning, Propper donning, and Proper wear schedule/adjustment Prosthetic wear tolerance: 3 hours 2x/day, 4 days/week Person educated: Patient and Spouse Education method: Explanation, Tactile cues, and Verbal cues Education comprehension: verbalized understanding, verbal cues required, tactile cues required, and needs further education  HOME EXERCISE PROGRAM: Access Code: [email or web address] URL: [email or web address] Date: 09/23/2023 Prepared by: Lorie Rook  Exercises - Supine Bridge  - 1 x daily - 4 x weekly - 2 sets - 5 reps - 2 seconds hold - Supine Lower Trunk Rotation  - 1 x daily - 4 x weekly - 2 sets - 5 reps - 5 seconds hold - Supine Heel Slide with Strap  - 1 x daily - 4 x weekly - 2 sets - 5 reps - 2-3 seconds hold - Supine Hip Abduction  - 1 x daily - 4 x weekly - 2 sets - 5 reps - 2-3 seconds hold - Seated Hip Flexion Toward Target  - 1 x daily - 4 x weekly - 2 sets - 5 reps - 5 seconds hold - Seated Eccentric Abdominal Lean Back  - 1 x daily - 4 x weekly - 2 sets - 5 reps - 5 seconds hold - Seated Sidebending  - 1 x daily - 4 x weekly - 2 sets - 5 reps - 5 seconds hold - Seated Trunk Rotation with Crossed  Arms  - 1 x daily - 4 x weekly - 2 sets - 5 reps - 5 seconds hold - Seated March  - 1 x daily - 4 x weekly - 2 sets - 10 reps - 5 seconds hold - Seated Knee Flexion Extension AROM   - 1 x daily - 4 x weekly - 2 sets - 10 reps - 5 seconds hold    ASSESSMENT:  CLINICAL IMPRESSION: patient required less assistance for stand-pivot transfer and gait but requires constant cues.    OBJECTIVE IMPAIRMENTS: Abnormal gait, decreased activity tolerance, decreased balance, decreased endurance, decreased knowledge of condition, decreased knowledge of use of DME, decreased mobility, difficulty walking, decreased ROM, decreased strength, decreased safety awareness, postural dysfunction, prosthetic dependency , and pain.   ACTIVITY LIMITATIONS: standing, transfers, and locomotion level  PARTICIPATION LIMITATIONS: community activity, household mobility and dependency / burden of care on family  PERSONAL FACTORS: Age, Fitness, Past/current experiences, Time since onset of injury/illness/exacerbation, and 3+ comorbidities: see PMH are also affecting patient's functional outcome.   REHAB POTENTIAL: Good  CLINICAL DECISION MAKING: Evolving/moderate complexity  EVALUATION COMPLEXITY: Moderate   GOALS: Goals reviewed with patient? Yes  SHORT TERM GOALS: Target date: 11/12/2023:  Patient & husband verbalized patient able to perform scooting transfer to / from [facility] and sit to/from stand [facility] to RW with husband's assistance safely. Baseline: SEE OBJECTIVE DATA Goal status:   MET  10/14/2023 2.  Patient able to sit to stand w/c to rolling walker with min assistance. Baseline: SEE OBJECTIVE DATA Goal status: partially MET (not consistently)  11/09/2023  3. Patient ambulates 64ft including turning 90* to position to sit with RW & prosthesis with modA.  Baseline: Ongoing   11/09/2023  UPDATED LONG TERM GOALS: Target date: 12/03/2023  Patient & husband continues to demonstrate & verbalize understanding of  prosthetic care and wears prosthesis >80% of awake hours daily to enable safe utilization of prosthesis. Baseline: SEE OBJECTIVE DATA Goal status: Ongoing    11/09/2023  Patient and husband report patient is able to stand to enable cleaning after toileting safely with no  issues Baseline: SEE OBJECTIVE DATA Goal status: Ongoing  11/09/2023  Patient able to perform sit to/from stand transfers with minA.  Baseline: SEE OBJECTIVE DATA Goal status: Ongoing  11/09/2023  Patient ambulates >9ft including turning 90* to position to sit with prosthesis & RW with minA with husband's assistance safely. Baseline: SEE OBJECTIVE DATA Goal status: Ongoing  11/09/2023  Patient understanding of ongoing HEP. Baseline: SEE OBJECTIVE DATA Goal status:  Ongoing  11/09/2023  PLAN:  PT FREQUENCY: 2x/week  PT DURATION: 12 weeks  PLANNED INTERVENTIONS: 97164- PT Re-evaluation, 97110-Therapeutic exercises, 97530- Therapeutic activity, 97112- Neuromuscular re-education, [phone]- Self Care, 60454- Gait training, [phone]- Prosthetic training, Patient/Family education, Balance training, DME instructions, Therapeutic exercises, Therapeutic activity, Neuromuscular re-education, Gait training, and Self Care  PLAN FOR NEXT SESSION: check if they tried PT recommendations for self-care,  Work towards LTG's.  Continue to encourage daily use of BSC.  Continue with standing & gait activities.    Lorie Rook, PT, DPT 11/09/2023, 2:41 PM

## 2023-11-10 DIAGNOSIS — N2581 Secondary hyperparathyroidism of renal origin: Secondary | ICD-10-CM | POA: Diagnosis not present

## 2023-11-10 DIAGNOSIS — N186 End stage renal disease: Secondary | ICD-10-CM | POA: Diagnosis not present

## 2023-11-10 DIAGNOSIS — Z992 Dependence on renal dialysis: Secondary | ICD-10-CM | POA: Diagnosis not present

## 2023-11-10 DIAGNOSIS — E1122 Type 2 diabetes mellitus with diabetic chronic kidney disease: Secondary | ICD-10-CM | POA: Diagnosis not present

## 2023-11-10 DIAGNOSIS — D689 Coagulation defect, unspecified: Secondary | ICD-10-CM | POA: Diagnosis not present

## 2023-11-11 ENCOUNTER — Encounter: Payer: Self-pay | Admitting: Physical Therapy

## 2023-11-11 ENCOUNTER — Ambulatory Visit: Admitting: Physical Therapy

## 2023-11-11 ENCOUNTER — Other Ambulatory Visit: Payer: Self-pay | Admitting: Family Medicine

## 2023-11-11 DIAGNOSIS — M6281 Muscle weakness (generalized): Secondary | ICD-10-CM | POA: Diagnosis not present

## 2023-11-11 DIAGNOSIS — Z7409 Other reduced mobility: Secondary | ICD-10-CM

## 2023-11-11 DIAGNOSIS — R2689 Other abnormalities of gait and mobility: Secondary | ICD-10-CM

## 2023-11-11 DIAGNOSIS — R2681 Unsteadiness on feet: Secondary | ICD-10-CM | POA: Diagnosis not present

## 2023-11-11 DIAGNOSIS — G546 Phantom limb syndrome with pain: Secondary | ICD-10-CM | POA: Diagnosis not present

## 2023-11-11 DIAGNOSIS — S88111D Complete traumatic amputation at level between knee and ankle, right lower leg, subsequent encounter: Secondary | ICD-10-CM

## 2023-11-11 NOTE — Telephone Encounter (Unsigned)
 Copied from CRM [id number]. Topic: Clinical - Medication Refill >> Nov 11, 2023 12:20 PM Rosaria Common wrote: Medication: methocarbamol  (ROBAXIN ) 750 MG tablet  Has the patient contacted their pharmacy? Yes (Agent: If no, request that the patient contact the pharmacy for the refill. If patient does not wish to contact the pharmacy document the reason why and proceed with request.) (Agent: If yes, when and what did the pharmacy advise?)  This is the patient's preferred pharmacy:  [id number] [place], [place] - [address] [place] [address] Phone: [phone] Fax: [phone]  Is this the correct pharmacy for this prescription? Yes If no, delete pharmacy and type the correct one.   Has the prescription been filled recently? No  Is the patient out of the medication? No  Has the patient been seen for an appointment in the last year OR does the patient have an upcoming appointment? Yes  Can we respond through MyChart? No  Agent: Please be advised that Rx refills may take up to 3 business days. We ask that you follow-up with your pharmacy.

## 2023-11-11 NOTE — Therapy (Signed)
 OUTPATIENT PHYSICAL THERAPY PROSTHETIC TREATMENT  Patient Name: Susan [name] MRN: [id number] DOB:April 24, 1951, 74 y.o., female Today's Date: 11/11/2023  PCP: Austine Lefort, MD  REFERRING PROVIDER:  Gearldean Keepers, MD  END OF SESSION:  PT End of Session - 11/11/23 1354     Visit Number 49    Number of Visits 60    Date for PT Re-Evaluation 12/03/23    Authorization Type [employer]    PT Start Time 1349    PT Stop Time 1430    PT Time Calculation (min) 41 min    Equipment Utilized During Treatment Gait belt    Activity Tolerance Patient tolerated treatment well;Patient limited by fatigue    Behavior During Therapy WFL for tasks assessed/performed                 Past Medical History:  Diagnosis Date   Anemia    hx   Anxiety    Arthritis    generalized (03/15/2014)   CAD (coronary artery disease)    MI in 2000 - MI  2007 - treated bare metal stent (no nuclear since then as 9/11)   Carotid artery disease (HCC)    Chronic diastolic heart failure (HCC)    a) ECHO (08/2013) EF 55-60% and RV function nl b) RHC (08/2013) RA 4, RV 30/5/7, PA 25/10 (16), PCWP 7, Fick CO/CI 6.3/2.7, PVR 1.5 WU, PA 61 and 66%   Daily headache    ~ every other day; since I fell in June (03/15/2014)   Depression    Diabetic retinopathy (HCC)    Dyslipidemia    ESRD (end stage renal disease) (HCC)    Dialysis on Tues Thurs Sat   Exertional shortness of breath    GERD (gastroesophageal reflux disease)    History of blood transfusion    History of kidney stones    HTN (hypertension)    Hypothyroidism    Myocardial infarction (HCC)    Obesity    Osteoarthritis    PAF (paroxysmal atrial fibrillation) (HCC)    Peripheral neuropathy    bilateral feet/hands   PONV (postoperative nausea and vomiting)    RBBB (right bundle branch block)    Old   Stroke (HCC)    mini strokes   Type II diabetes mellitus (HCC)    Type II, [name] libre left upper arm. patient has omnipod  insulin  pump with Novolin R Insulin    Past Surgical History:  Procedure Laterality Date   A/V FISTULAGRAM Left 11/07/2022   Procedure: A/V Fistulagram;  Surgeon: Carlene Che, MD;  [facility]: [facility];  Service: Cardiovascular;  Laterality: Left;   ABDOMINAL HYSTERECTOMY  1980's   AMPUTATION Right 02/24/2018   Procedure: RIGHT FOOT GREAT TOE AND 2ND TOE AMPUTATION;  Surgeon: Timothy Ford, MD;  Location: [facility] OR;  Service: Orthopedics;  Laterality: Right;   AMPUTATION Right 04/30/2018   Procedure: RIGHT TRANSMETATARSAL AMPUTATION;  Surgeon: Timothy Ford, MD;  Location: [facility] OR;  Service: Orthopedics;  Laterality: Right;   AMPUTATION Right 05/02/2022   Procedure: RIGHT BELOW KNEE AMPUTATION;  Surgeon: Timothy Ford, MD;  Location: [facility] OR;  Service: Orthopedics;  Laterality: Right;   APPLICATION OF WOUND VAC Right 06/13/2022   Procedure: APPLICATION OF WOUND VAC;  Surgeon: Timothy Ford, MD;  [facility]: [facility] OR;  Service: Orthopedics;  Laterality: Right;   APPLICATION OF WOUND VAC Left 11/14/2022   Procedure: APPLICATION OF WOUND VAC;  Surgeon: Timothy Ford, MD;  [facility]: [facility];  Service: Orthopedics;  Laterality: Left;   AV FISTULA PLACEMENT Left 04/02/2022   Procedure: LEFT ARM ARTERIOVENOUS (AV) FISTULA CREATION;  Surgeon: Margherita Shell, MD;  [facility]: [facility];  Service: Vascular;  Laterality: Left;  PERIPHERAL NERVE BLOCK   AV FISTULA PLACEMENT Right 04/27/2023   Procedure: RIGHT ARM BRACHIOBASILIC ARTERIOVENOUS (AV) FISTULA CREATION;  Surgeon: Carlene Che, MD;  [facility]: [facility];  Service: Vascular;  Laterality: Right;   BASCILIC VEIN TRANSPOSITION Left 07/31/2022   Procedure: LEFT ARM SECOND STAGE BASILIC VEIN TRANSPOSITION;  Surgeon: Margherita Shell, MD;  [facility];  Service: Vascular;  Laterality: Left;   BIOPSY  05/27/2020   Procedure: BIOPSY;  Surgeon: Vinetta Greening, DO;  [facility]: [facility];  Service: Endoscopy;;   CATARACT EXTRACTION, BILATERAL Bilateral ?2013    COLONOSCOPY W/ POLYPECTOMY     COLONOSCOPY WITH PROPOFOL  N/A 03/13/2019   Procedure: COLONOSCOPY WITH PROPOFOL ;  Surgeon: Nannette Babe, MD;  Location: [facility] ENDOSCOPY;  Service: Gastroenterology;  Laterality: N/A;   CORONARY ANGIOPLASTY WITH STENT PLACEMENT  1999; 2007   1 + 1   ERCP N/A 02/03/2022   Procedure: ENDOSCOPIC RETROGRADE CHOLANGIOPANCREATOGRAPHY (ERCP);  Surgeon: Alvis Jourdain, MD;  Location: [facility] ENDOSCOPY;  Service: Gastroenterology;  Laterality: N/A;   ESOPHAGOGASTRODUODENOSCOPY N/A 02/12/2023   Procedure: ESOPHAGOGASTRODUODENOSCOPY (EGD);  Surgeon: Alvis Jourdain, MD;  Location: [facility] ENDOSCOPY;  Service: Gastroenterology;  Laterality: N/A;   ESOPHAGOGASTRODUODENOSCOPY (EGD) WITH PROPOFOL  N/A 03/13/2019   Procedure: ESOPHAGOGASTRODUODENOSCOPY (EGD) WITH PROPOFOL ;  Surgeon: Nannette Babe, MD;  Location: [facility] ENDOSCOPY;  Service: Gastroenterology;  Laterality: N/A;   ESOPHAGOGASTRODUODENOSCOPY (EGD) WITH PROPOFOL  N/A 05/27/2020   Procedure: ESOPHAGOGASTRODUODENOSCOPY (EGD) WITH PROPOFOL ;  Surgeon: Vinetta Greening, DO;  [facility];  Service: Endoscopy;  Laterality: N/A;   ESOPHAGOGASTRODUODENOSCOPY (EGD) WITH PROPOFOL  N/A 09/03/2022   Procedure: ESOPHAGOGASTRODUODENOSCOPY (EGD) WITH PROPOFOL ;  Surgeon: Alvis Jourdain, MD;  Location: [facility] ENDOSCOPY;  Service: Gastroenterology;  Laterality: N/A;   EYE SURGERY Bilateral    lazer   FLEXIBLE SIGMOIDOSCOPY N/A 05/23/2022   Procedure: FLEXIBLE SIGMOIDOSCOPY;  Surgeon: Alvis Jourdain, MD;  Location: [facility] ENDOSCOPY;  Service: Gastroenterology;  Laterality: N/A;   FLEXIBLE SIGMOIDOSCOPY N/A 05/24/2022   Procedure: FLEXIBLE SIGMOIDOSCOPY;  Surgeon: Daina Drum, MD;  Location: [facility] ENDOSCOPY;  Service: Gastroenterology;  Laterality: N/A;   FLEXIBLE SIGMOIDOSCOPY N/A 09/03/2022   Procedure: FLEXIBLE SIGMOIDOSCOPY;  Surgeon: Alvis Jourdain, MD;  Location: [facility] ENDOSCOPY;  Service: Gastroenterology;  Laterality: N/A;   HEMOSTASIS CLIP PLACEMENT   03/13/2019   Procedure: HEMOSTASIS CLIP PLACEMENT;  Surgeon: Nannette Babe, MD;  Location: [facility] ENDOSCOPY;  Service: Gastroenterology;;   HEMOSTASIS CLIP PLACEMENT  05/23/2022   Procedure: HEMOSTASIS CLIP PLACEMENT;  Surgeon: Alvis Jourdain, MD;  Location: [facility] ENDOSCOPY;  Service: Gastroenterology;;   HEMOSTASIS CONTROL  05/24/2022   Procedure: HEMOSTASIS CONTROL;  Surgeon: Daina Drum, MD;  Location: [name] ENDOSCOPY;  Service: Gastroenterology;;   HOT HEMOSTASIS N/A 05/23/2022   Procedure: HOT HEMOSTASIS (ARGON PLASMA COAGULATION/BICAP);  Surgeon: Alvis Jourdain, MD;  Location: [facility] ENDOSCOPY;  Service: Gastroenterology;  Laterality: N/A;   I & D EXTREMITY Left 05/05/2022   Procedure: IRRIGATION AND DEBRIDEMENT LEFT ARM AV FISTULA;  Surgeon: Young Hensen, MD;  Location: [facility] OR;  Service: Vascular;  Laterality: Left;   I & D EXTREMITY N/A 11/14/2022   Procedure: IRRIGATION AND DEBRIDEMENT OF LOWER EXTREMITY WOUND;  Surgeon: Timothy Ford, MD;  [facility];  Service: Orthopedics;  Laterality: N/A;  INSERTION OF DIALYSIS CATHETER Right 04/02/2022   Procedure: INSERTION OF TUNNELED DIALYSIS CATHETER;  Surgeon: Margherita Shell, MD;  [facility];  Service: Vascular;  Laterality: Right;   IR FLUORO GUIDE CV LINE RIGHT  08/10/2023   IR VENOCAVAGRAM IVC  08/10/2023   KNEE ARTHROSCOPY Left 10/25/2006   POLYPECTOMY  03/13/2019   Procedure: POLYPECTOMY;  Surgeon: Nannette Babe, MD;  Location: [facility] ENDOSCOPY;  Service: Gastroenterology;;   REMOVAL OF STONES  02/03/2022   Procedure: REMOVAL OF STONES;  Surgeon: Alvis Jourdain, MD;  Location: [facility] ENDOSCOPY;  Service: Gastroenterology;;   REVISON OF ARTERIOVENOUS FISTULA Left 08/20/2022   Procedure: REVISON OF LEFT ARM ARTERIOVENOUS FISTULA;  Surgeon: Margherita Shell, MD;  [facility];  Service: Vascular;  Laterality: Left;   RIGHT HEART CATH N/A 07/24/2017   Procedure: RIGHT HEART CATH;  Surgeon: Mardell Shade, MD;  [facility];   Service: Cardiovascular;  Laterality: N/A;   RIGHT HEART CATHETERIZATION N/A 09/22/2013   Procedure: RIGHT HEART CATH;  Surgeon: Mardell Shade, MD;  Location: [facility] CATH LAB;  Service: Cardiovascular;  Laterality: N/A;   SHOULDER ARTHROSCOPY WITH OPEN ROTATOR CUFF REPAIR Right 03/14/2014   Procedure: RIGHT SHOULDER ARTHROSCOPY WITH BICEPS RELEASE, OPEN SUBSCAPULA REPAIR, OPEN SUPRASPINATUS REPAIR.;  Surgeon: Jasmine Mesi, MD;  Location: [facility] OR;  Service: Orthopedics;  Laterality: Right;   SPHINCTEROTOMY  02/03/2022   Procedure: SPHINCTEROTOMY;  Surgeon: Alvis Jourdain, MD;  Location: [facility] ENDOSCOPY;  Service: Gastroenterology;;   STUMP REVISION Right 06/13/2022   Procedure: REVISION RIGHT BELOW KNEE AMPUTATION;  Surgeon: Timothy Ford, MD;  Location: [facility] OR;  Service: Orthopedics;  Laterality: Right;   TEE WITHOUT CARDIOVERSION N/A 02/04/2022   Procedure: TRANSESOPHAGEAL ECHOCARDIOGRAM (TEE);  Surgeon: Mardell Shade, MD;  Location: [facility] ENDOSCOPY;  Service: Cardiovascular;  Laterality: N/A;   THROMBECTOMY W/ EMBOLECTOMY Left 08/20/2022   Procedure: THROMBECTOMY OF LEFT ARM ARTERIOVENOUS FISTULA;  Surgeon: Margherita Shell, MD;  Location: [facility] OR;  Service: Vascular;  Laterality: Left;   TOE AMPUTATION Right 02/24/2018   GREAT TOE AND 2ND TOE AMPUTATION   TUBAL LIGATION  1970's   UPPER EXTREMITY VENOGRAPHY N/A 07/31/2023   Procedure: UPPER EXTREMITY VENOGRAPHY;  Surgeon: Carlene Che, MD;  [facility];  Service: Cardiovascular;  Laterality: N/A;   Patient Active Problem List   Diagnosis Date Noted   UTI (urinary tract infection) 10/01/2023   Pyelonephritis 08/06/2023   Complicated UTI (urinary tract infection) 08/05/2023   Atrial fibrillation (HCC) 06/17/2023   Atopic dermatitis 06/17/2023   Hyperosmolar hyperglycemic state (HHS) (HCC) 02/05/2023   Gastroparesis 02/05/2023   Depression 01/29/2023   Intractable nausea and vomiting 01/20/2023   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 difficile colitis 06/05/2022   Spleen hematoma 06/05/2022   Dehiscence of amputation stump of right lower extremity (HCC) 06/05/2022   Rectal ulcer 05/27/2022   ESRD (end stage renal disease) (HCC) 05/27/2022   GI bleed 05/23/2022  Difficult intravenous access 05/23/2022   Gangrene of right foot (HCC) 05/02/2022   S/P BKA (below knee amputation) unilateral, right (HCC) 05/02/2022   Unspecified protein-calorie malnutrition (HCC) 04/15/2022   Secondary hyperparathyroidism of renal origin (HCC) 04/14/2022   Coagulation defect, unspecified (HCC) 04/09/2022   Acquired absence of other left toe(s) (HCC) 04/07/2022   Allergy, unspecified, initial encounter 04/07/2022   Dependence on renal dialysis (HCC) 04/07/2022   Gout due to renal impairment, unspecified site 04/07/2022   Hypertensive heart and chronic kidney disease with heart failure and with stage 5 chronic kidney disease, or end stage renal disease (HCC) 04/07/2022   Personal history of transient ischemic attack (TIA), and cerebral infarction without residual deficits 04/07/2022   Renal osteodystrophy 04/07/2022   Venous stasis ulcer of right calf (HCC) 03/31/2022   Fistula, colovaginal 03/26/2022   Diarrhea 03/26/2022   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   Sepsis without acute organ dysfunction (HCC)    Bacteremia    Acute pancreatitis 02/01/2022    Abdominal pain 02/01/2022   SIRS (systemic inflammatory response syndrome) (HCC) 02/01/2022   Transaminitis 02/01/2022   History of anemia due to chronic kidney disease 02/01/2022   Paroxysmal atrial fibrillation (HCC) 02/01/2022   Uncontrolled type 2 diabetes mellitus with hyperglycemia, with long-term current use of insulin  (HCC) 01/14/2022   NSTEMI (non-ST elevated myocardial infarction) (HCC) 03/05/2021   Acute renal failure superimposed on stage 4 chronic kidney disease (HCC) 08/22/2020   Hypoalbuminemia 05/25/2020   GERD (gastroesophageal reflux disease) 05/25/2020   Pressure injury of skin 05/17/2020   Acute on chronic combined systolic and diastolic congestive heart failure (HCC) 03/07/2020   Type 2 diabetes mellitus with diabetic polyneuropathy, with long-term current use of insulin  (HCC) 03/07/2020   Obesity, Class III, BMI 40-49.9 (morbid obesity) 03/07/2020   Common bile duct (CBD) obstruction 05/28/2019   Benign neoplasm of ascending colon    Benign neoplasm of transverse colon    Benign neoplasm of descending colon    Benign neoplasm of sigmoid colon    Gastric polyps    Hyperkalemia 03/11/2019   Prolonged QT interval 03/11/2019   Acute blood loss anemia 03/11/2019   Onychomycosis 06/21/2018   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)    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 (HCC)    PVD (peripheral vascular disease) (HCC) 10/26/2017   E-coli UTI 07/27/2017   Hypothyroidism 07/27/2017   AKI (acute kidney injury) (HCC)    PAH (pulmonary artery hypertension) (HCC)    Impaired ambulation 07/19/2017   Leg cramps 02/27/2017   Peripheral edema 01/12/2017   Diabetic neuropathy (HCC) 11/12/2016   Anemia of chronic disease 10/03/2015   Historical diagnosis of generalized anxiety disorder 10/03/2015   Secondary insomnia 10/03/2015   Acute bronchitis 09/05/2015   Hyperglycemia due to diabetes mellitus (HCC) 06/07/2015   Non compliance with medical  treatment 04/17/2014   Rotator cuff tear 03/14/2014   Obesity 09/23/2013   Chronic HFrEF (heart failure with reduced ejection fraction) (HCC) 06/03/2013   Hypotension 12/25/2012   Hypokalemia 12/25/2012   Hyponatremia 12/25/2012   Urinary incontinence    MDD (major depressive disorder), recurrent episode, moderate (HCC) 11/12/2010   RBBB (right bundle branch block)    Wide-complex tachycardia    Coronary artery disease    Hyperlipemia 01/22/2009   Chronic hypotension 01/22/2009    ONSET DATE: 03/09/2023 MD referral to PT  REFERRING DIAG: S88.111D (ICD-10-CM) - Below-knee amputation of right lower extremity, subsequent encounter   THERAPY DIAG:  Muscle weakness (generalized)  Other abnormalities of gait and mobility  Unsteadiness on feet  Impaired functional mobility, balance, gait, and endurance  Phantom limb syndrome with pain (HCC)  Rationale for Evaluation and Treatment: Rehabilitation  SUBJECTIVE:  SUBJECTIVE STATEMENT: Her bowel movements are very hard and has trouble completely getting BM out.     PERTINENT HISTORY: Right TTA 05/02/22, Lymphedema, idiopathic chronic venous HTN with ulcer, depression, colitis, diverticulitis, ESRD, gout, NSTEMI, DM2, polyneuropathy, PVD, CAD, right bundle branch block, mini strokes  PAIN:  Are you having pain?  Back pain today  5/10 Neck pain left side 4/10 & right side 7/10 Right shoulder 7/10  PRECAUTIONS: Fall and Other: No BP LUE  WEIGHT BEARING RESTRICTIONS: No  FALLS: Has patient fallen in last 6 months? No  LIVING ENVIRONMENT: Lives with: lives with their spouse and 2 small dogs Lives in: mobile home Home Access: Ramped entrance Home layout: One level Stairs: Yes: External: 6-7 steps; can reach both Has following equipment at home: Single point cane, Environmental consultant - 2 wheeled, Environmental consultant - 4 wheeled, Wheelchair (manual), [employer], Grab bars, and Ramped entry  OCCUPATION:  retired  PLOF: prior to amputation for ~year  used RW in home & community  PATIENT GOALS:  to use prosthesis to walk, get out w/c in & out of car, get to bathroom.   OBJECTIVE:  COGNITION: Overall cognitive status:  Eval on 03/23/2023:   Within functional limits for tasks assessed  POSTURE:  Eval on 03/23/2023:  rounded shoulders, forward head, increased thoracic kyphosis, flexed trunk , and weight shift left  LOWER EXTREMITY ROM: ROM Right eval Left eval  Hip flexion    Hip extension    Hip abduction    Hip adduction    Hip internal rotation    Hip external rotation    Knee flexion    Knee extension    Ankle dorsiflexion    Ankle plantarflexion    Ankle inversion    Ankle eversion     (Blank rows = not tested)  LOWER EXTREMITY MMT:  MMT Right eval Left eval Right 09/09/23 Left 09/09/23  Hip flexion 3-/5 3-/5 3/5 3/5  Hip extension 2/5 2/5 3-/5 3-/5  Hip abduction 2+/5 2+/5 3-/5 3-/5  Hip adduction      Hip internal rotation      Hip external rotation      Knee flexion 3-/5 3-/5 3/5 3/5  Knee extension 3-/5 3-/5 3/5 3/5  Ankle dorsiflexion      Ankle plantarflexion      Ankle inversion      Ankle eversion      At Evaluation all strength testing is grossly seated and functionally standing / gait. (Blank rows = not tested)  TRANSFERS: 09/07/2023: Scooting transfers with minA and sit to/from stand transfers with modA.   07/27/2023: -Sit>stand from wc with RW: MinA with heavy vc for sequencing and offweighting LE -Stand>sit wc with RW: CGA with patient performing safe controlled descent  06/15/2023: -Sit to stand transfers with RW wheelchair<>mat table at varied height: 19in with maxA to rise to stand; 22in ModA rise to stand, 24in min-modA rise to stand. -stand pivot transfer with RW w/c to mat table with modA once standing.  -Lateral scooting wc<>table at level height SBA; vc for LE positioning for optimal use of LE during transfer -lateral scoot R: modA due to fatigue and uneven surface transfer from low  surface to higher surface   05/04/2023: Squat pivot transfers with modA.  04/21/2023:  Pt sit to stand w/c to //bar with one hand pushing on w/c and other hand on //bar with modA first time & MinA 2nd/ 3rd time.  Best is RUE on //bar & LUE on w/c.  Stand to sit reaching LUE to w/c with minA to control descent.   PT demo & verbal cues on sit to/from stand w/c to RW.  1st time modA 2 person. 2nd time maxA one person.  Stand to sit with minA and constant cueing. Scooting transfers with minA with w/c & mat direct contact & same ht.   Eval on 03/23/2023:  Sit to stand: Max A (two-person assist) from 22 w/c to rolling walker Stand to sit: Max A (two-person assist) rolling walker to wheelchair  FUNCTIONAL TESTs:   09/09/2023: Patient able to tolerate static stance for 5 minutes with BUE support on RW with supervision.  This was current certification goal set so that patient can stand long enough for her husband to assist with cleaning her after toileting.  07/27/2023: Patient stood with RW SBA for 54sec before c/o lightheadedness and sitting down to toilet. Resisted nudges applied posteriorly to simulate hygienic cleaning. VC for weight shifts when LE become tired.  BP: after standing 93min54sec 161/82 HR 92  06/15/2023: -Static stance for standing with RW; requiring Mod-MaxA for rise to stand d/t patient unsteady on feet posterior lean without proper righting strategy, SBA for stand   04/21/2023: Pt able to stand 60 sec with BUE on //bars with supervision.  Pt able to stand for 1 min with RW support with minA.  Eval on 03/23/2023: Patient maintained upright holding rolling walker with mod assist 2 people for safety for 30 seconds.  GAIT: 09/09/2023: Patient ambulated 30' with RW including turning 90*to position to sit with mod assistance  07/27/2023: Patient ambulated 28ft with narrow bathroom entry and 90deg turn to sit on commode CGA with minA and heavy vc for progression and  sequencing of RW. While turning to sit on commode patient lost balance laterally requiring mod-maxA from therapist and patient use of grab bar to regain balance.   06/17/2023: Patient ambulated 61ft (maximal tolerated distance) with RW ModA (2 person for safety). Required VC for LE sequencing and minA to steer walker and prevent tip over.   04/21/2023: Pt amb 5' in //bars with modA (2 person for safety) with PT verbal & tactile cues on sequence and weight shift.  Pt amb 10' with RW with maxA (2 person for safety) with PT verbal & tactile cues on sequence and weight shift.  Eval on 03/23/2023:  Patient took 2 steps (one per LE) with +2 max assist with rolling walker and TTA prosthesis.  Patient adducting prosthesis stepping on left foot with no awareness.  CURRENT PROSTHETIC WEAR ASSESSMENT: 09/07/2023: Patient and husband are independent with: skin check, prosthetic cleaning, ply sock cleaning, proper wear schedule/adjustment, residual limb care, correct ply sock adjustment, and proper weight-bearing schedule/adjustment Pt reports wear most of awake hours on dialysis and non-dialysis days.  She wears prosthesis to dialysis now to assist with transfers prn and is able to verbalize proper adjustment of prosthesis weight for weigh-in & weigh-out.   06/17/2023:  Independence with prosthetic care:  Patient and husband are independent with: skin check, prosthetic cleaning, ply sock cleaning, proper wear schedule/adjustment Patient is dependent with: residual limb care, correct ply sock adjustment, and proper weight-bearing schedule/adjustment Donning prosthesis: husband is independent with proper donning. Patient requires MaxA. Patient can verbally direct someone  how to properly assist. Doffing prosthesis: Husband is independent with proper donning. Patient requires modA. Patient can verbally direct someone how to properly assist. Prosthetic wear tolerance: majority of awake hours including dialysis  hours, 1x/day, 3-4 days/week Prosthetic weight bearing tolerance: able to tolerate of standing without complains of prosthetic limb pain. Time not limited by limb discomfort but limited by muscle fatigue.   Eval on 03/23/2023:   Patient is dependent with: skin check, residual limb care, prosthetic cleaning, ply sock cleaning, correct ply sock adjustment, proper wear schedule/adjustment, and proper weight-bearing schedule/adjustment Donning prosthesis: Max A Doffing prosthesis: Min A Prosthetic wear tolerance: 2-6 hours, 1x/day, 3-4 days/week Prosthetic weight bearing tolerance: 3 minutes with limb pain Edema: pitting edema Residual limb condition: No open areas, dry skin, normal color and temperature, cylindrical shape Prosthetic description: Silicone liner with pin lock suspension, total contact socket with flexible inner socket, sach foot    TODAY'S TREATMENT:                                                                                                             DATE: 11/11/2023   Prosthetic Training with Transtibial Prosthesis:  -PT demo & verbal cues on technique / not locking knees out too soon. Sit to stand with minA 3 reps with PT cues on technique. -Pt amb 105' with RW with minA but constant verbal cues on pelvic weight shift to stance LE.  Pt's husband following with w/c so straight path for gait.   -PT demo & verbal cues on set up with w/c & BSC 10' apart.  If she begins to collapse then use belt to lower under control to floor.  Then call local fire dept for helping getting up. Pt & husband verbalized understanding.    Therapeutic Exercise: -PT reviewed cervical & lumbar AROM flexion, extension, rotation & side bend with deep breath at end range and looking where she is moving.  PT emphasized importance for continuing exercises or pain & mobility can get worse. Pt & husband verbalized understanding.    TREATMENT:                                                                                                              DATE: 11/09/2023 Therapeutic Activities with Transtibial Prosthesis:  -Sit to stand with modA 3 reps & minA 1 rep with PT cues on technique. -PT demo & verbal cues on stand pivot transfer with RW for weight shift to stance LE.  Pt able to transfer to left & to right with minA once upright but requires verbal cues  for each movement.   -PT demo & verbal cues on pelvic weight shift to stance LE.  Pt amb 23' with RW with minA but constant cues.  Pt's husband following with w/c so straight path for gait.    Self-Care: -PT simulated and verbal cues on cleaning privates after using BSC and dressing lower body.  Pt & husband verbalized understanding.     TREATMENT:                                                                                                             DATE: 11/04/2023 Therapeutic Activities with Transtibial Prosthesis:  -PT informed pt & husband that Dr. Julio Ohm sent order to [facility] for socket revision.  PT recommended setting up appt.  PT reviewed cleaning liners and need to rotate between the 2 liners.  Pt & husband verbalized understanding.  -Pt  sit to stand w/c to RW with modA with PT cues. Stand to sit CGA with PT cues. 4 reps during session. -standing for 2 min with pelvic weight shift left/right and lifting contralateral LE for 2 sec 5 reps ea LE. -PT demo & verbal cues on weight shift to stance LE then stepping other LE.  Stand upright then advance RW so unweighting hands enough to move RW.   -pt amb 10' straight path with modA with constant PT cues for above.   -pt demo how to apply weight shifts for stand pivot transfers.  Patient was too fatigued by this point in the session to actually work on the skill.    TREATMENT:                                                                                                             DATE: 11/02/2023 Therapeutic Activities with Transtibial Prosthesis: -Pt  sit to stand w/c to RW with  modA with PT cues. Stand to sit CGA with PT cues. 4 reps during session. -stand pivot transfer with RW 90* turn with modA.  Pt following directions better today.   -pt amb 13' on straight path with RW with maxA 2 person.  -Her socket rotates on her residual limb with transfers & gait with severe negative impact on safety & mobility.  PT sent telephone encounter to Dr. Julio Ohm requesting prescription for socket revision with 2 new liners.  PT explained to pt & husband and they both agreed.    Self-Care: -pt became tearful at end of session because a 53yo friend who was also on dialysis died yesterday.  PT explained pt need for continuing dialysis and what generally happens if she  stops going.  Pt & husband verbalized understanding.    PATIENT EDUCATION: PATIENT EDUCATED ON FOLLOWING PROSTHETIC CARE: Education details: Use of stockinette under proximal liner to decrease itching sensation, no wounds presents to PT recommended not using Vive wear under liner Skin check, Prosthetic cleaning, Propper donning, and Proper wear schedule/adjustment Prosthetic wear tolerance: 3 hours 2x/day, 4 days/week Person educated: Patient and Spouse Education method: Explanation, Tactile cues, and Verbal cues Education comprehension: verbalized understanding, verbal cues required, tactile cues required, and needs further education  HOME EXERCISE PROGRAM: Access Code: [email or web address] URL: [email or web address] Date: 09/23/2023 Prepared by: Lorie Rook  Exercises - Supine Bridge  - 1 x daily - 4 x weekly - 2 sets - 5 reps - 2 seconds hold - Supine Lower Trunk Rotation  - 1 x daily - 4 x weekly - 2 sets - 5 reps - 5 seconds hold - Supine Heel Slide with Strap  - 1 x daily - 4 x weekly - 2 sets - 5 reps - 2-3 seconds hold - Supine Hip Abduction  - 1 x daily - 4 x weekly - 2 sets - 5 reps - 2-3 seconds hold - Seated Hip Flexion Toward Target  - 1 x daily - 4 x weekly - 2 sets - 5 reps - 5 seconds hold -  Seated Eccentric Abdominal Lean Back  - 1 x daily - 4 x weekly - 2 sets - 5 reps - 5 seconds hold - Seated Sidebending  - 1 x daily - 4 x weekly - 2 sets - 5 reps - 5 seconds hold - Seated Trunk Rotation with Crossed Arms  - 1 x daily - 4 x weekly - 2 sets - 5 reps - 5 seconds hold - Seated March  - 1 x daily - 4 x weekly - 2 sets - 10 reps - 5 seconds hold - Seated Knee Flexion Extension AROM   - 1 x daily - 4 x weekly - 2 sets - 10 reps - 5 seconds hold    ASSESSMENT:  CLINICAL IMPRESSION: Patient was able to improved sit to stand transfers with PT directions in technique but does require consistent, constant cues.  Patient was able to ambulate significantly further today with only min assist.  Again she does require constant cueing and encouragement.  OBJECTIVE IMPAIRMENTS: Abnormal gait, decreased activity tolerance, decreased balance, decreased endurance, decreased knowledge of condition, decreased knowledge of use of DME, decreased mobility, difficulty walking, decreased ROM, decreased strength, decreased safety awareness, postural dysfunction, prosthetic dependency , and pain.   ACTIVITY LIMITATIONS: standing, transfers, and locomotion level  PARTICIPATION LIMITATIONS: community activity, household mobility and dependency / burden of care on family  PERSONAL FACTORS: Age, Fitness, Past/current experiences, Time since onset of injury/illness/exacerbation, and 3+ comorbidities: see PMH are also affecting patient's functional outcome.   REHAB POTENTIAL: Good  CLINICAL DECISION MAKING: Evolving/moderate complexity  EVALUATION COMPLEXITY: Moderate   GOALS: Goals reviewed with patient? Yes  SHORT TERM GOALS: Target date: 11/12/2023:  Patient & husband verbalized patient able to perform scooting transfer to / from [facility] and sit to/from stand [facility] to RW with husband's assistance safely. Baseline: SEE OBJECTIVE DATA Goal status:   MET  10/14/2023 2.  Patient able to sit to stand w/c to  rolling walker with min assistance. Baseline: SEE OBJECTIVE DATA Goal status: partially MET (not consistently)  11/09/2023  3. Patient ambulates 78ft including turning 90* to position to sit with RW & prosthesis with modA.  Baseline: Ongoing  11/11/2023  UPDATED LONG TERM GOALS: Target date: 12/03/2023  Patient & husband continues to demonstrate & verbalize understanding of prosthetic care and wears prosthesis >80% of awake hours daily to enable safe utilization of prosthesis. Baseline: SEE OBJECTIVE DATA Goal status: Ongoing    11/09/2023  Patient and husband report patient is able to stand to enable cleaning after toileting safely with no issues Baseline: SEE OBJECTIVE DATA Goal status: Ongoing  11/09/2023  Patient able to perform sit to/from stand transfers with minA.  Baseline: SEE OBJECTIVE DATA Goal status: Ongoing  11/09/2023  Patient ambulates >76ft including turning 90* to position to sit with prosthesis & RW with minA with husband's assistance safely. Baseline: SEE OBJECTIVE DATA Goal status: Ongoing  11/09/2023  Patient understanding of ongoing HEP. Baseline: SEE OBJECTIVE DATA Goal status:  Ongoing  11/11/2023  PLAN:  PT FREQUENCY: 2x/week  PT DURATION: 12 weeks  PLANNED INTERVENTIONS: 97164- PT Re-evaluation, 97110-Therapeutic exercises, 97530- Therapeutic activity, 97112- Neuromuscular re-education, [phone]- Self Care, 60454- Gait training, [phone]- Prosthetic training, Patient/Family education, Balance training, DME instructions, Therapeutic exercises, Therapeutic activity, Neuromuscular re-education, Gait training, and Self Care  PLAN FOR NEXT SESSION:   Work towards LTG's.  Continue to encourage daily use of BSC.  Continue with standing & gait activities.    Larah Kuntzman, PT, DPT 11/11/2023, 4:42 PM

## 2023-11-13 NOTE — Telephone Encounter (Signed)
 Requested medication (s) are due for refill today: Yes  Requested medication (s) are on the active medication list: yes  Last refill:    Future visit scheduled:   Notes to clinic:  Not delegated.    Requested Prescriptions  Pending Prescriptions Disp Refills   methocarbamol  (ROBAXIN ) 750 MG tablet 60 tablet 0    Sig: Take 1 tablet (750 mg total) by mouth every 6 (six) hours as needed for muscle spasms.     Not Delegated - Analgesics:  Muscle Relaxants Failed - 11/13/2023  1:21 PM      Failed - This refill cannot be delegated      Failed - Valid encounter within last 6 months    Recent Outpatient Visits           2 weeks ago Strain of neck muscle, initial encounter   [place] [facility] Austine Lefort, MD   2 months ago ESRF (end stage renal failure) [facility])   [place] [facility] Austine Lefort, MD   4 months ago Pneumonia of right lower lobe due to infectious organism   [place] [facility] Austine Lefort, MD   4 months ago Acute bronchitis, unspecified organism   [place] [facility] Amadeo June, MD   8 months ago Uncontrolled type 2 diabetes mellitus with hyperglycemia [facility])   [place] [facility] [name], Cisco Crest, MD       Future Appointments             In 2 months Branch, Joyceann No, MD [facility]

## 2023-11-16 ENCOUNTER — Encounter: Payer: Self-pay | Admitting: Physical Therapy

## 2023-11-16 ENCOUNTER — Ambulatory Visit: Admitting: Physical Therapy

## 2023-11-16 DIAGNOSIS — R2689 Other abnormalities of gait and mobility: Secondary | ICD-10-CM | POA: Diagnosis not present

## 2023-11-16 DIAGNOSIS — G546 Phantom limb syndrome with pain: Secondary | ICD-10-CM | POA: Diagnosis not present

## 2023-11-16 DIAGNOSIS — I5022 Chronic systolic (congestive) heart failure: Secondary | ICD-10-CM | POA: Diagnosis not present

## 2023-11-16 DIAGNOSIS — Z961 Presence of intraocular lens: Secondary | ICD-10-CM | POA: Diagnosis not present

## 2023-11-16 DIAGNOSIS — H4312 Vitreous hemorrhage, left eye: Secondary | ICD-10-CM | POA: Diagnosis not present

## 2023-11-16 DIAGNOSIS — R2681 Unsteadiness on feet: Secondary | ICD-10-CM

## 2023-11-16 DIAGNOSIS — M6281 Muscle weakness (generalized): Secondary | ICD-10-CM | POA: Diagnosis not present

## 2023-11-16 DIAGNOSIS — Z7409 Other reduced mobility: Secondary | ICD-10-CM | POA: Diagnosis not present

## 2023-11-16 DIAGNOSIS — H26492 Other secondary cataract, left eye: Secondary | ICD-10-CM | POA: Diagnosis not present

## 2023-11-16 DIAGNOSIS — E113513 Type 2 diabetes mellitus with proliferative diabetic retinopathy with macular edema, bilateral: Secondary | ICD-10-CM | POA: Diagnosis not present

## 2023-11-16 DIAGNOSIS — I2721 Secondary pulmonary arterial hypertension: Secondary | ICD-10-CM | POA: Diagnosis not present

## 2023-11-16 MED ORDER — METHOCARBAMOL 750 MG PO TABS
750.0000 mg | ORAL_TABLET | Freq: Four times a day (QID) | ORAL | 0 refills | Status: DC | PRN
Start: 2023-11-16 — End: 2023-12-01

## 2023-11-16 NOTE — Therapy (Signed)
 OUTPATIENT PHYSICAL THERAPY PROSTHETIC TREATMENT  Patient Name: Susan [name] MRN: [id number] DOB:[birth year], 73 y.o., female Today's Date: 11/16/2023  PCP: Duanne Butler DASEN, MD  REFERRING PROVIDER:  Jerona Sage, MD  END OF SESSION:  PT End of Session - 11/16/23 1344     Visit Number 50    Number of Visits 60    Date for PT Re-Evaluation 12/03/23    Authorization Type [employer]    PT Start Time 1344    PT Stop Time 1425    PT Time Calculation (min) 41 min    Equipment Utilized During Treatment Gait belt    Activity Tolerance Patient tolerated treatment well;Patient limited by fatigue    Behavior During Therapy WFL for tasks assessed/performed                  Past Medical History:  Diagnosis Date   Anemia    hx   Anxiety    Arthritis    generalized (03/15/2014)   CAD (coronary artery disease)    MI in 2000 - MI  2007 - treated bare metal stent (no nuclear since then as 9/11)   Carotid artery disease (HCC)    Chronic diastolic heart failure (HCC)    a) ECHO (08/2013) EF 55-60% and RV function nl b) RHC (08/2013) RA 4, RV 30/5/7, PA 25/10 (16), PCWP 7, Fick CO/CI 6.3/2.7, PVR 1.5 WU, PA 61 and 66%   Daily headache    ~ every other day; since I fell in June (03/15/2014)   Depression    Diabetic retinopathy (HCC)    Dyslipidemia    ESRD (end stage renal disease) (HCC)    Dialysis on Tues Thurs Sat   Exertional shortness of breath    GERD (gastroesophageal reflux disease)    History of blood transfusion    History of kidney stones    HTN (hypertension)    Hypothyroidism    Myocardial infarction (HCC)    Obesity    Osteoarthritis    PAF (paroxysmal atrial fibrillation) (HCC)    Peripheral neuropathy    bilateral feet/hands   PONV (postoperative nausea and vomiting)    RBBB (right bundle branch block)    Old   Stroke (HCC)    mini strokes   Type II diabetes mellitus (HCC)    Type II, Barnet libre left upper arm. patient has  omnipod insulin  pump with Novolin R Insulin    Past Surgical History:  Procedure Laterality Date   A/V FISTULAGRAM Left 11/07/2022   Procedure: A/V Fistulagram;  Surgeon: Magda Debby SAILOR, MD;  [facility]: [facility];  Service: Cardiovascular;  Laterality: Left;   ABDOMINAL HYSTERECTOMY  1980's   AMPUTATION Right 02/24/2018   Procedure: RIGHT FOOT GREAT TOE AND 2ND TOE AMPUTATION;  Surgeon: Sage Jerona GAILS, MD;  Location: [facility] OR;  Service: Orthopedics;  Laterality: Right;   AMPUTATION Right 04/30/2018   Procedure: RIGHT TRANSMETATARSAL AMPUTATION;  Surgeon: Sage Jerona GAILS, MD;  Location: [facility] OR;  Service: Orthopedics;  Laterality: Right;   AMPUTATION Right 05/02/2022   Procedure: RIGHT BELOW KNEE AMPUTATION;  Surgeon: Sage Jerona GAILS, MD;  Location: [facility] OR;  Service: Orthopedics;  Laterality: Right;   APPLICATION OF WOUND VAC Right 06/13/2022   Procedure: APPLICATION OF WOUND VAC;  Surgeon: Sage Jerona GAILS, MD;  Location: [facility] OR;  Service: Orthopedics;  Laterality: Right;   APPLICATION OF WOUND VAC Left 11/14/2022   Procedure: APPLICATION OF WOUND VAC;  Surgeon: Sage Jerona GAILS, MD;  [facility]: [facility] OR;  Service: Orthopedics;  Laterality: Left;   AV FISTULA PLACEMENT Left 04/02/2022   Procedure: LEFT ARM ARTERIOVENOUS (AV) FISTULA CREATION;  Surgeon: Serene Gaile ORN, MD;  Location: [facility] OR;  Service: Vascular;  Laterality: Left;  PERIPHERAL NERVE BLOCK   AV FISTULA PLACEMENT Right 04/27/2023   Procedure: RIGHT ARM BRACHIOBASILIC ARTERIOVENOUS (AV) FISTULA CREATION;  Surgeon: Magda Debby SAILOR, MD;  [facility]: [facility];  Service: Vascular;  Laterality: Right;   BASCILIC VEIN TRANSPOSITION Left 07/31/2022   Procedure: LEFT ARM SECOND STAGE BASILIC VEIN TRANSPOSITION;  Surgeon: Serene Gaile ORN, MD;  [facility]: [facility] OR;  Service: Vascular;  Laterality: Left;   BIOPSY  05/27/2020   Procedure: BIOPSY;  Surgeon: Cindie Carlin POUR, DO;  Location: [facility];  Service: Endoscopy;;   CATARACT EXTRACTION, BILATERAL Bilateral  ?2013   COLONOSCOPY W/ POLYPECTOMY     COLONOSCOPY WITH PROPOFOL  N/A 03/13/2019   Procedure: COLONOSCOPY WITH PROPOFOL ;  Surgeon: Albertus Gordy HERO, MD;  Location: [facility] ENDOSCOPY;  Service: Gastroenterology;  Laterality: N/A;   CORONARY ANGIOPLASTY WITH STENT PLACEMENT  1999; 2007   1 + 1   ERCP N/A 02/03/2022   Procedure: ENDOSCOPIC RETROGRADE CHOLANGIOPANCREATOGRAPHY (ERCP);  Surgeon: Rollin Dover, MD;  Location: [facility] ENDOSCOPY;  Service: Gastroenterology;  Laterality: N/A;   ESOPHAGOGASTRODUODENOSCOPY N/A 02/12/2023   Procedure: ESOPHAGOGASTRODUODENOSCOPY (EGD);  Surgeon: Rollin Dover, MD;  Location: [facility] ENDOSCOPY;  Service: Gastroenterology;  Laterality: N/A;   ESOPHAGOGASTRODUODENOSCOPY (EGD) WITH PROPOFOL  N/A 03/13/2019   Procedure: ESOPHAGOGASTRODUODENOSCOPY (EGD) WITH PROPOFOL ;  Surgeon: Albertus Gordy HERO, MD;  Location: [facility] ENDOSCOPY;  Service: Gastroenterology;  Laterality: N/A;   ESOPHAGOGASTRODUODENOSCOPY (EGD) WITH PROPOFOL  N/A 05/27/2020   Procedure: ESOPHAGOGASTRODUODENOSCOPY (EGD) WITH PROPOFOL ;  Surgeon: Cindie Carlin POUR, DO;  [facility];  Service: Endoscopy;  Laterality: N/A;   ESOPHAGOGASTRODUODENOSCOPY (EGD) WITH PROPOFOL  N/A 09/03/2022   Procedure: ESOPHAGOGASTRODUODENOSCOPY (EGD) WITH PROPOFOL ;  Surgeon: Rollin Dover, MD;  Location: [facility] ENDOSCOPY;  Service: Gastroenterology;  Laterality: N/A;   EYE SURGERY Bilateral    lazer   FLEXIBLE SIGMOIDOSCOPY N/A 05/23/2022   Procedure: FLEXIBLE SIGMOIDOSCOPY;  Surgeon: Rollin Dover, MD;  Location: [facility] ENDOSCOPY;  Service: Gastroenterology;  Laterality: N/A;   FLEXIBLE SIGMOIDOSCOPY N/A 05/24/2022   Procedure: FLEXIBLE SIGMOIDOSCOPY;  Surgeon: Federico Rosario BROCKS, MD;  Location: [facility] ENDOSCOPY;  Service: Gastroenterology;  Laterality: N/A;   FLEXIBLE SIGMOIDOSCOPY N/A 09/03/2022   Procedure: FLEXIBLE SIGMOIDOSCOPY;  Surgeon: Rollin Dover, MD;  Location: [facility] ENDOSCOPY;  Service: Gastroenterology;  Laterality: N/A;   HEMOSTASIS CLIP  PLACEMENT  03/13/2019   Procedure: HEMOSTASIS CLIP PLACEMENT;  Surgeon: Albertus Gordy HERO, MD;  Location: [facility] ENDOSCOPY;  Service: Gastroenterology;;   HEMOSTASIS CLIP PLACEMENT  05/23/2022   Procedure: HEMOSTASIS CLIP PLACEMENT;  Surgeon: Rollin Dover, MD;  Location: [facility] ENDOSCOPY;  Service: Gastroenterology;;   HEMOSTASIS CONTROL  05/24/2022   Procedure: HEMOSTASIS CONTROL;  Surgeon: Federico Rosario BROCKS, MD;  Location: [facility] ENDOSCOPY;  Service: Gastroenterology;;   HOT HEMOSTASIS N/A 05/23/2022   Procedure: HOT HEMOSTASIS (ARGON PLASMA COAGULATION/BICAP);  Surgeon: Rollin Dover, MD;  Location: [facility] ENDOSCOPY;  Service: Gastroenterology;  Laterality: N/A;   I & D EXTREMITY Left 05/05/2022   Procedure: IRRIGATION AND DEBRIDEMENT LEFT ARM AV FISTULA;  Surgeon: Gretta Lonni PARAS, MD;  Location: [facility] OR;  Service: Vascular;  Laterality: Left;   I & D EXTREMITY N/A 11/14/2022   Procedure: IRRIGATION AND DEBRIDEMENT OF LOWER EXTREMITY WOUND;  Surgeon: Harden Jerona GAILS, MD;  [facility] OR;  Service: Orthopedics;  Laterality: N/A;  INSERTION OF DIALYSIS CATHETER Right 04/02/2022   Procedure: INSERTION OF TUNNELED DIALYSIS CATHETER;  Surgeon: Serene Gaile ORN, MD;  [facility]: [facility] OR;  Service: Vascular;  Laterality: Right;   IR FLUORO GUIDE CV LINE RIGHT  08/10/2023   IR VENOCAVAGRAM IVC  08/10/2023   KNEE ARTHROSCOPY Left 10/25/2006   POLYPECTOMY  03/13/2019   Procedure: POLYPECTOMY;  Surgeon: Albertus Gordy HERO, MD;  Location: [facility] ENDOSCOPY;  Service: Gastroenterology;;   REMOVAL OF STONES  02/03/2022   Procedure: REMOVAL OF STONES;  Surgeon: Rollin Dover, MD;  Location: [facility] ENDOSCOPY;  Service: Gastroenterology;;   REVISON OF ARTERIOVENOUS FISTULA Left 08/20/2022   Procedure: REVISON OF LEFT ARM ARTERIOVENOUS FISTULA;  Surgeon: Serene Gaile ORN, MD;  [facility] OR;  Service: Vascular;  Laterality: Left;   RIGHT HEART CATH N/A 07/24/2017   Procedure: RIGHT HEART CATH;  Surgeon: Cherrie Toribio SAUNDERS, MD;  [facility]  INVASIVE CV LAB;  Service: Cardiovascular;  Laterality: N/A;   RIGHT HEART CATHETERIZATION N/A 09/22/2013   Procedure: RIGHT HEART CATH;  Surgeon: Toribio SAUNDERS Cherrie, MD;  Location: [facility] CATH LAB;  Service: Cardiovascular;  Laterality: N/A;   SHOULDER ARTHROSCOPY WITH OPEN ROTATOR CUFF REPAIR Right 03/14/2014   Procedure: RIGHT SHOULDER ARTHROSCOPY WITH BICEPS RELEASE, OPEN SUBSCAPULA REPAIR, OPEN SUPRASPINATUS REPAIR.;  Surgeon: Cordella Glendia Hutchinson, MD;  Location: [facility] OR;  Service: Orthopedics;  Laterality: Right;   SPHINCTEROTOMY  02/03/2022   Procedure: SPHINCTEROTOMY;  Surgeon: Rollin Dover, MD;  Location: [facility] ENDOSCOPY;  Service: Gastroenterology;;   STUMP REVISION Right 06/13/2022   Procedure: REVISION RIGHT BELOW KNEE AMPUTATION;  Surgeon: Harden Jerona GAILS, MD;  Location: [facility] OR;  Service: Orthopedics;  Laterality: Right;   TEE WITHOUT CARDIOVERSION N/A 02/04/2022   Procedure: TRANSESOPHAGEAL ECHOCARDIOGRAM (TEE);  Surgeon: Cherrie Toribio SAUNDERS, MD;  Location: [facility] ENDOSCOPY;  Service: Cardiovascular;  Laterality: N/A;   THROMBECTOMY W/ EMBOLECTOMY Left 08/20/2022   Procedure: THROMBECTOMY OF LEFT ARM ARTERIOVENOUS FISTULA;  Surgeon: Serene Gaile ORN, MD;  Location: [facility] OR;  Service: Vascular;  Laterality: Left;   TOE AMPUTATION Right 02/24/2018   GREAT TOE AND 2ND TOE AMPUTATION   TUBAL LIGATION  1970's   UPPER EXTREMITY VENOGRAPHY N/A 07/31/2023   Procedure: UPPER EXTREMITY VENOGRAPHY;  Surgeon: Magda Debby SAILOR, MD;  [facility]: [facility];  Service: Cardiovascular;  Laterality: N/A;   Patient Active Problem List   Diagnosis Date Noted   UTI (urinary tract infection) 10/01/2023   Pyelonephritis 08/06/2023   Complicated UTI (urinary tract infection) 08/05/2023   Atrial fibrillation (HCC) 06/17/2023   Atopic dermatitis 06/17/2023   Hyperosmolar hyperglycemic state (HHS) (HCC) 02/05/2023   Gastroparesis 02/05/2023   Depression 01/29/2023   Intractable nausea and vomiting 01/20/2023    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 difficile colitis 06/05/2022   Spleen hematoma 06/05/2022   Dehiscence of amputation stump of right lower extremity (HCC) 06/05/2022   Rectal ulcer 05/27/2022   ESRD (end stage renal disease) (HCC) 05/27/2022   GI bleed 05/23/2022  Difficult intravenous access 05/23/2022   Gangrene of right foot (HCC) 05/02/2022   S/P BKA (below knee amputation) unilateral, right (HCC) 05/02/2022   Unspecified protein-calorie malnutrition (HCC) 04/15/2022   Secondary hyperparathyroidism of renal origin (HCC) 04/14/2022   Coagulation defect, unspecified (HCC) 04/09/2022   Acquired absence of other left toe(s) (HCC) 04/07/2022   Allergy, unspecified, initial encounter 04/07/2022   Dependence on renal dialysis (HCC) 04/07/2022   Gout due to renal impairment, unspecified site 04/07/2022   Hypertensive heart and chronic kidney disease with heart failure and with stage 5 chronic kidney disease, or end stage renal disease (HCC) 04/07/2022   Personal history of transient ischemic attack (TIA), and cerebral infarction without residual deficits 04/07/2022   Renal osteodystrophy 04/07/2022   Venous stasis ulcer of right calf (HCC) 03/31/2022   Fistula, colovaginal 03/26/2022   Diarrhea 03/26/2022   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   Sepsis without acute organ dysfunction (HCC)    Bacteremia    Acute pancreatitis  02/01/2022   Abdominal pain 02/01/2022   SIRS (systemic inflammatory response syndrome) (HCC) 02/01/2022   Transaminitis 02/01/2022   History of anemia due to chronic kidney disease 02/01/2022   Paroxysmal atrial fibrillation (HCC) 02/01/2022   Uncontrolled type 2 diabetes mellitus with hyperglycemia, with long-term current use of insulin  (HCC) 01/14/2022   NSTEMI (non-ST elevated myocardial infarction) (HCC) 03/05/2021   Acute renal failure superimposed on stage 4 chronic kidney disease (HCC) 08/22/2020   Hypoalbuminemia 05/25/2020   GERD (gastroesophageal reflux disease) 05/25/2020   Pressure injury of skin 05/17/2020   Acute on chronic combined systolic and diastolic congestive heart failure (HCC) 03/07/2020   Type 2 diabetes mellitus with diabetic polyneuropathy, with long-term current use of insulin  (HCC) 03/07/2020   Obesity, Class III, BMI 40-49.9 (morbid obesity) 03/07/2020   Common bile duct (CBD) obstruction 05/28/2019   Benign neoplasm of ascending colon    Benign neoplasm of transverse colon    Benign neoplasm of descending colon    Benign neoplasm of sigmoid colon    Gastric polyps    Hyperkalemia 03/11/2019   Prolonged QT interval 03/11/2019   Acute blood loss anemia 03/11/2019   Onychomycosis 06/21/2018   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)    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 (HCC)    PVD (peripheral vascular disease) (HCC) 10/26/2017   E-coli UTI 07/27/2017   Hypothyroidism 07/27/2017   AKI (acute kidney injury) (HCC)    PAH (pulmonary artery hypertension) (HCC)    Impaired ambulation 07/19/2017   Leg cramps 02/27/2017   Peripheral edema 01/12/2017   Diabetic neuropathy (HCC) 11/12/2016   Anemia of chronic disease 10/03/2015   Historical diagnosis of generalized anxiety disorder 10/03/2015   Secondary insomnia 10/03/2015   Acute bronchitis 09/05/2015   Hyperglycemia due to diabetes mellitus (HCC) 06/07/2015   Non  compliance with medical treatment 04/17/2014   Rotator cuff tear 03/14/2014   Obesity 09/23/2013   Chronic HFrEF (heart failure with reduced ejection fraction) (HCC) 06/03/2013   Hypotension 12/25/2012   Hypokalemia 12/25/2012   Hyponatremia 12/25/2012   Urinary incontinence    MDD (major depressive disorder), recurrent episode, moderate (HCC) 11/12/2010   RBBB (right bundle branch block)    Wide-complex tachycardia    Coronary artery disease    Hyperlipemia 01/22/2009   Chronic hypotension 01/22/2009    ONSET DATE: 03/09/2023 MD referral to PT  REFERRING DIAG: S88.111D (ICD-10-CM) - Below-knee amputation of right lower extremity, subsequent encounter   THERAPY DIAG:  Muscle weakness (generalized)  Other abnormalities of gait and mobility  Unsteadiness on feet  Impaired functional mobility, balance, gait, and endurance  Phantom limb syndrome with pain (HCC)  Rationale for Evaluation and Treatment: Rehabilitation  SUBJECTIVE:  SUBJECTIVE STATEMENT: Eye doctor said she needs surgery on right eye.  She is good getting on [employer] and cleaning is getting whole lot better.  PERTINENT HISTORY: Right TTA 05/02/22, Lymphedema, idiopathic chronic venous HTN with ulcer, depression, colitis, diverticulitis, ESRD, gout, NSTEMI, DM2, polyneuropathy, PVD, CAD, right bundle branch block, mini strokes  PAIN:  Are you having pain?  Back pain today 6/10 Neck pain left side 6/10 & right side 7/10 Right shoulder 6/10  PRECAUTIONS: Fall and Other: No BP LUE  WEIGHT BEARING RESTRICTIONS: No  FALLS: Has patient fallen in last 6 months? No  LIVING ENVIRONMENT: Lives with: lives with their spouse and 2 small dogs Lives in: mobile home Home Access: Ramped entrance Home layout: One level Stairs: Yes: External: 6-7 steps; can reach both Has following equipment at home: Single point cane, Environmental consultant - 2 wheeled, Environmental consultant - 4 wheeled, Wheelchair (manual), [employer], Grab bars, and Ramped  entry  OCCUPATION:  retired  PLOF: prior to amputation for ~year used RW in home & community  PATIENT GOALS:  to use prosthesis to walk, get out w/c in & out of car, get to bathroom.   OBJECTIVE:  COGNITION: Overall cognitive status:  Eval on 03/23/2023:   Within functional limits for tasks assessed  POSTURE:  Eval on 03/23/2023:  rounded shoulders, forward head, increased thoracic kyphosis, flexed trunk , and weight shift left  LOWER EXTREMITY ROM: ROM Right eval Left eval  Hip flexion    Hip extension    Hip abduction    Hip adduction    Hip internal rotation    Hip external rotation    Knee flexion    Knee extension    Ankle dorsiflexion    Ankle plantarflexion    Ankle inversion    Ankle eversion     (Blank rows = not tested)  LOWER EXTREMITY MMT:  MMT Right eval Left eval Right 09/09/23 Left 09/09/23  Hip flexion 3-/5 3-/5 3/5 3/5  Hip extension 2/5 2/5 3-/5 3-/5  Hip abduction 2+/5 2+/5 3-/5 3-/5  Hip adduction      Hip internal rotation      Hip external rotation      Knee flexion 3-/5 3-/5 3/5 3/5  Knee extension 3-/5 3-/5 3/5 3/5  Ankle dorsiflexion      Ankle plantarflexion      Ankle inversion      Ankle eversion      At Evaluation all strength testing is grossly seated and functionally standing / gait. (Blank rows = not tested)  TRANSFERS: 09/07/2023: Scooting transfers with minA and sit to/from stand transfers with modA.   07/27/2023: -Sit>stand from wc with RW: MinA with heavy vc for sequencing and offweighting LE -Stand>sit wc with RW: CGA with patient performing safe controlled descent  06/15/2023: -Sit to stand transfers with RW wheelchair<>mat table at varied height: 19in with maxA to rise to stand; 22in ModA rise to stand, 24in min-modA rise to stand. -stand pivot transfer with RW w/c to mat table with modA once standing.  -Lateral scooting wc<>table at level height SBA; vc for LE positioning for optimal use of LE during transfer -lateral  scoot R: modA due to fatigue and uneven surface transfer from low surface to higher surface   05/04/2023: Squat  pivot transfers with modA.  04/21/2023: Pt sit to stand w/c to //bar with one hand pushing on w/c and other hand on //bar with modA first time & MinA 2nd/ 3rd time.  Best is RUE on //bar & LUE on w/c.  Stand to sit reaching LUE to w/c with minA to control descent.   PT demo & verbal cues on sit to/from stand w/c to RW.  1st time modA 2 person. 2nd time maxA one person.  Stand to sit with minA and constant cueing. Scooting transfers with minA with w/c & mat direct contact & same ht.   Eval on 03/23/2023:  Sit to stand: Max A (two-person assist) from 22 w/c to rolling walker Stand to sit: Max A (two-person assist) rolling walker to wheelchair  FUNCTIONAL TESTs:   09/09/2023: Patient able to tolerate static stance for 5 minutes with BUE support on RW with supervision.  This was current certification goal set so that patient can stand long enough for her husband to assist with cleaning her after toileting.  07/27/2023: Patient stood with RW SBA for 54sec before c/o lightheadedness and sitting down to toilet. Resisted nudges applied posteriorly to simulate hygienic cleaning. VC for weight shifts when LE become tired.  BP: after standing 81min54sec 161/82 HR 92  06/15/2023: -Static stance for standing with RW; requiring Mod-MaxA for rise to stand d/t patient unsteady on feet posterior lean without proper righting strategy, SBA for stand   04/21/2023: Pt able to stand 60 sec with BUE on //bars with supervision.  Pt able to stand for 1 min with RW support with minA.  Eval on 03/23/2023: Patient maintained upright holding rolling walker with mod assist 2 people for safety for 30 seconds.  GAIT: 09/09/2023: Patient ambulated 30' with RW including turning 90*to position to sit with mod assistance  07/27/2023: Patient ambulated 36ft with narrow bathroom entry and 90deg turn to  sit on commode CGA with minA and heavy vc for progression and sequencing of RW. While turning to sit on commode patient lost balance laterally requiring mod-maxA from therapist and patient use of grab bar to regain balance.   06/17/2023: Patient ambulated 62ft (maximal tolerated distance) with RW ModA (2 person for safety). Required VC for LE sequencing and minA to steer walker and prevent tip over.   04/21/2023: Pt amb 5' in //bars with modA (2 person for safety) with PT verbal & tactile cues on sequence and weight shift.  Pt amb 10' with RW with maxA (2 person for safety) with PT verbal & tactile cues on sequence and weight shift.  Eval on 03/23/2023:  Patient took 2 steps (one per LE) with +2 max assist with rolling walker and TTA prosthesis.  Patient adducting prosthesis stepping on left foot with no awareness.  CURRENT PROSTHETIC WEAR ASSESSMENT: 09/07/2023: Patient and husband are independent with: skin check, prosthetic cleaning, ply sock cleaning, proper wear schedule/adjustment, residual limb care, correct ply sock adjustment, and proper weight-bearing schedule/adjustment Pt reports wear most of awake hours on dialysis and non-dialysis days.  She wears prosthesis to dialysis now to assist with transfers prn and is able to verbalize proper adjustment of prosthesis weight for weigh-in & weigh-out.   06/17/2023:  Independence with prosthetic care:  Patient and husband are independent with: skin check, prosthetic cleaning, ply sock cleaning, proper wear schedule/adjustment Patient is dependent with: residual limb care, correct ply sock adjustment, and proper weight-bearing schedule/adjustment Donning prosthesis: husband is independent with proper donning. Patient requires  MaxA. Patient can verbally direct someone how to properly assist. Doffing prosthesis: Husband is independent with proper donning. Patient requires modA. Patient can verbally direct someone how to properly assist. Prosthetic  wear tolerance: majority of awake hours including dialysis hours, 1x/day, 3-4 days/week Prosthetic weight bearing tolerance: able to tolerate of standing without complains of prosthetic limb pain. Time not limited by limb discomfort but limited by muscle fatigue.   Eval on 03/23/2023:   Patient is dependent with: skin check, residual limb care, prosthetic cleaning, ply sock cleaning, correct ply sock adjustment, proper wear schedule/adjustment, and proper weight-bearing schedule/adjustment Donning prosthesis: Max A Doffing prosthesis: Min A Prosthetic wear tolerance: 2-6 hours, 1x/day, 3-4 days/week Prosthetic weight bearing tolerance: 3 minutes with limb pain Edema: pitting edema Residual limb condition: No open areas, dry skin, normal color and temperature, cylindrical shape Prosthetic description: Silicone liner with pin lock suspension, total contact socket with flexible inner socket, sach foot    TODAY'S TREATMENT:                                                                                                             DATE: 11/16/2023   Prosthetic Training with Transtibial Prosthesis:  -Sit to stand with minA 3 reps with PT cues on technique.  -Standing 3 minutes with rolling walker support with supervision with patient performing AROM for cervical flexion/extension, rotation both directions and side in both directions.   -Pt amb 22' x 2 with 90*to position in front of a target chair for sitting with RW with minA but constant verbal cues on pelvic weight shift to stance LE.  Patient's husband stood near her wheelchair so he could bring it in short time if needed but not falling directly behind patient to develop more security/confidence for gait without the chair right behind her.    TREATMENT:                                                                                                             DATE: 11/11/2023   Prosthetic Training with Transtibial Prosthesis:  -PT demo &  verbal cues on technique / not locking knees out too soon. Sit to stand with minA 3 reps with PT cues on technique. -Pt amb 57' with RW with minA but constant verbal cues on pelvic weight shift to stance LE.  Pt's husband following with w/c so straight path for gait.   -PT demo & verbal cues on set up with w/c & BSC 10' apart.  If she begins to collapse then use belt to lower under control to floor.  Then call local fire dept for helping getting up. Pt & husband verbalized understanding.    Therapeutic Exercise: -PT reviewed cervical & lumbar AROM flexion, extension, rotation & side bend with deep breath at end range and looking where she is moving.  PT emphasized importance for continuing exercises or pain & mobility can get worse. Pt & husband verbalized understanding.    TREATMENT:                                                                                                             DATE: 11/09/2023 Therapeutic Activities with Transtibial Prosthesis:  -Sit to stand with modA 3 reps & minA 1 rep with PT cues on technique. -PT demo & verbal cues on stand pivot transfer with RW for weight shift to stance LE.  Pt able to transfer to left & to right with minA once upright but requires verbal cues for each movement.   -PT demo & verbal cues on pelvic weight shift to stance LE.  Pt amb 23' with RW with minA but constant cues.  Pt's husband following with w/c so straight path for gait.    Self-Care: -PT simulated and verbal cues on cleaning privates after using BSC and dressing lower body.  Pt & husband verbalized understanding.     TREATMENT:                                                                                                             DATE: 11/04/2023 Therapeutic Activities with Transtibial Prosthesis:  -PT informed pt & husband that Dr. Harden sent order to [facility] for socket revision.  PT recommended setting up appt.  PT reviewed cleaning liners and need to rotate between the 2  liners.  Pt & husband verbalized understanding.  -Pt  sit to stand w/c to RW with modA with PT cues. Stand to sit CGA with PT cues. 4 reps during session. -standing for 2 min with pelvic weight shift left/right and lifting contralateral LE for 2 sec 5 reps ea LE. -PT demo & verbal cues on weight shift to stance LE then stepping other LE.  Stand upright then advance RW so unweighting hands enough to move RW.   -pt amb 10' straight path with modA with constant PT cues for above.   -pt demo how to apply weight shifts for stand pivot transfers.  Patient was too fatigued by this point in the session to actually work on the skill.    PATIENT EDUCATION: PATIENT EDUCATED ON FOLLOWING PROSTHETIC CARE: Education details: Use of stockinette under proximal liner to decrease itching sensation,  no wounds presents to PT recommended not using Vive wear under liner Skin check, Prosthetic cleaning, Propper donning, and Proper wear schedule/adjustment Prosthetic wear tolerance: 3 hours 2x/day, 4 days/week Person educated: Patient and Spouse Education method: Explanation, Tactile cues, and Verbal cues Education comprehension: verbalized understanding, verbal cues required, tactile cues required, and needs further education  HOME EXERCISE PROGRAM: Access Code: [email or web address] URL: [email or web address] Date: 09/23/2023 Prepared by: Grayce Spatz  Exercises - Supine Bridge  - 1 x daily - 4 x weekly - 2 sets - 5 reps - 2 seconds hold - Supine Lower Trunk Rotation  - 1 x daily - 4 x weekly - 2 sets - 5 reps - 5 seconds hold - Supine Heel Slide with Strap  - 1 x daily - 4 x weekly - 2 sets - 5 reps - 2-3 seconds hold - Supine Hip Abduction  - 1 x daily - 4 x weekly - 2 sets - 5 reps - 2-3 seconds hold - Seated Hip Flexion Toward Target  - 1 x daily - 4 x weekly - 2 sets - 5 reps - 5 seconds hold - Seated Eccentric Abdominal Lean Back  - 1 x daily - 4 x weekly - 2 sets - 5 reps - 5 seconds hold - Seated  Sidebending  - 1 x daily - 4 x weekly - 2 sets - 5 reps - 5 seconds hold - Seated Trunk Rotation with Crossed Arms  - 1 x daily - 4 x weekly - 2 sets - 5 reps - 5 seconds hold - Seated March  - 1 x daily - 4 x weekly - 2 sets - 10 reps - 5 seconds hold - Seated Knee Flexion Extension AROM   - 1 x daily - 4 x weekly - 2 sets - 10 reps - 5 seconds hold    ASSESSMENT:  CLINICAL IMPRESSION: Patient was able to perform cervical motions while standing with rolling walker support with no balance loss today.  Patient was able to ambulate between 2 chairs 22 feet apart including turning 90 degrees to position to sit with only min assist.  OBJECTIVE IMPAIRMENTS: Abnormal gait, decreased activity tolerance, decreased balance, decreased endurance, decreased knowledge of condition, decreased knowledge of use of DME, decreased mobility, difficulty walking, decreased ROM, decreased strength, decreased safety awareness, postural dysfunction, prosthetic dependency , and pain.   ACTIVITY LIMITATIONS: standing, transfers, and locomotion level  PARTICIPATION LIMITATIONS: community activity, household mobility and dependency / burden of care on family  PERSONAL FACTORS: Age, Fitness, Past/current experiences, Time since onset of injury/illness/exacerbation, and 3+ comorbidities: see PMH are also affecting patient's functional outcome.   REHAB POTENTIAL: Good  CLINICAL DECISION MAKING: Evolving/moderate complexity  EVALUATION COMPLEXITY: Moderate   GOALS: Goals reviewed with patient? Yes  SHORT TERM GOALS: Target date: 11/12/2023:  Patient & husband verbalized patient able to perform scooting transfer to / from [facility] and sit to/from stand [facility] to RW with husband's assistance safely. Baseline: SEE OBJECTIVE DATA Goal status:   MET  10/14/2023 2.  Patient able to sit to stand w/c to rolling walker with min assistance. Baseline: SEE OBJECTIVE DATA Goal status: partially MET (not consistently)  11/09/2023  3.  Patient ambulates 57ft including turning 90* to position to sit with RW & prosthesis with modA.  Baseline: Ongoing   11/11/2023  UPDATED LONG TERM GOALS: Target date: 12/03/2023  Patient & husband continues to demonstrate & verbalize understanding of prosthetic care and wears prosthesis >80% of awake hours daily to  enable safe utilization of prosthesis. Baseline: SEE OBJECTIVE DATA Goal status: Ongoing    11/09/2023  Patient and husband report patient is able to stand to enable cleaning after toileting safely with no issues Baseline: SEE OBJECTIVE DATA Goal status: Ongoing  11/09/2023  Patient able to perform sit to/from stand transfers with minA.  Baseline: SEE OBJECTIVE DATA Goal status: Ongoing  11/09/2023  Patient ambulates >84ft including turning 90* to position to sit with prosthesis & RW with minA with husband's assistance safely. Baseline: SEE OBJECTIVE DATA Goal status: Ongoing  11/09/2023  Patient understanding of ongoing HEP. Baseline: SEE OBJECTIVE DATA Goal status:  Ongoing  11/11/2023  PLAN:  PT FREQUENCY: 2x/week  PT DURATION: 12 weeks  PLANNED INTERVENTIONS: 97164- PT Re-evaluation, 97110-Therapeutic exercises, 97530- Therapeutic activity, 97112- Neuromuscular re-education, [phone]- Self Care, [id number]- Gait training, [phone]- Prosthetic training, Patient/Family education, Balance training, DME instructions, Therapeutic exercises, Therapeutic activity, Neuromuscular re-education, Gait training, and Self Care  PLAN FOR NEXT SESSION:     Work towards LTG's.  Continue to encourage daily use of BSC.  Continue with standing. Gait activities walking between 2 chairs initiating husband assisting with PT supervising.    Caroleen Stoermer, PT, DPT 11/16/2023, 4:14 PM

## 2023-11-17 DIAGNOSIS — D689 Coagulation defect, unspecified: Secondary | ICD-10-CM | POA: Diagnosis not present

## 2023-11-17 DIAGNOSIS — Z992 Dependence on renal dialysis: Secondary | ICD-10-CM | POA: Diagnosis not present

## 2023-11-17 DIAGNOSIS — N2581 Secondary hyperparathyroidism of renal origin: Secondary | ICD-10-CM | POA: Diagnosis not present

## 2023-11-17 DIAGNOSIS — N186 End stage renal disease: Secondary | ICD-10-CM | POA: Diagnosis not present

## 2023-11-17 DIAGNOSIS — D631 Anemia in chronic kidney disease: Secondary | ICD-10-CM | POA: Diagnosis not present

## 2023-11-18 ENCOUNTER — Other Ambulatory Visit: Payer: Self-pay

## 2023-11-18 ENCOUNTER — Telehealth: Payer: Self-pay | Admitting: Family Medicine

## 2023-11-18 ENCOUNTER — Encounter: Payer: Self-pay | Admitting: Physical Therapy

## 2023-11-18 ENCOUNTER — Ambulatory Visit: Admitting: Physical Therapy

## 2023-11-18 DIAGNOSIS — R2681 Unsteadiness on feet: Secondary | ICD-10-CM | POA: Diagnosis not present

## 2023-11-18 DIAGNOSIS — G546 Phantom limb syndrome with pain: Secondary | ICD-10-CM | POA: Diagnosis not present

## 2023-11-18 DIAGNOSIS — R2689 Other abnormalities of gait and mobility: Secondary | ICD-10-CM

## 2023-11-18 DIAGNOSIS — F339 Major depressive disorder, recurrent, unspecified: Secondary | ICD-10-CM

## 2023-11-18 DIAGNOSIS — Z7409 Other reduced mobility: Secondary | ICD-10-CM | POA: Diagnosis not present

## 2023-11-18 DIAGNOSIS — M6281 Muscle weakness (generalized): Secondary | ICD-10-CM | POA: Diagnosis not present

## 2023-11-18 NOTE — Therapy (Signed)
 OUTPATIENT PHYSICAL THERAPY PROSTHETIC TREATMENT  Patient Name: Susan [name] MRN: [id number] DOB:10-Sep-1950, 73 y.o., female Today's Date: 11/18/2023  PCP: Duanne Butler DASEN, MD  REFERRING PROVIDER:  Jerona Sage, MD  END OF SESSION:  PT End of Session - 11/18/23 1351     Visit Number 51    Number of Visits 60    Date for PT Re-Evaluation 12/03/23    Authorization Type [employer]    PT Start Time 1345    PT Stop Time 1425    PT Time Calculation (min) 40 min    Equipment Utilized During Treatment Gait belt    Activity Tolerance Patient tolerated treatment well;Patient limited by fatigue    Behavior During Therapy WFL for tasks assessed/performed                   Past Medical History:  Diagnosis Date   Anemia    hx   Anxiety    Arthritis    generalized (03/15/2014)   CAD (coronary artery disease)    MI in 2000 - MI  2007 - treated bare metal stent (no nuclear since then as 9/11)   Carotid artery disease (HCC)    Chronic diastolic heart failure (HCC)    a) ECHO (08/2013) EF 55-60% and RV function nl b) RHC (08/2013) RA 4, RV 30/5/7, PA 25/10 (16), PCWP 7, Fick CO/CI 6.3/2.7, PVR 1.5 WU, PA 61 and 66%   Daily headache    ~ every other day; since I fell in June (03/15/2014)   Depression    Diabetic retinopathy (HCC)    Dyslipidemia    ESRD (end stage renal disease) (HCC)    Dialysis on Tues Thurs Sat   Exertional shortness of breath    GERD (gastroesophageal reflux disease)    History of blood transfusion    History of kidney stones    HTN (hypertension)    Hypothyroidism    Myocardial infarction (HCC)    Obesity    Osteoarthritis    PAF (paroxysmal atrial fibrillation) (HCC)    Peripheral neuropathy    bilateral feet/hands   PONV (postoperative nausea and vomiting)    RBBB (right bundle branch block)    Old   Stroke (HCC)    mini strokes   Type II diabetes mellitus (HCC)    Type II, Barnet libre left upper arm. patient has  omnipod insulin  pump with Novolin R Insulin    Past Surgical History:  Procedure Laterality Date   A/V FISTULAGRAM Left 11/07/2022   Procedure: A/V Fistulagram;  Surgeon: Magda Debby SAILOR, MD;  [facility]: [facility];  Service: Cardiovascular;  Laterality: Left;   ABDOMINAL HYSTERECTOMY  1980's   AMPUTATION Right 02/24/2018   Procedure: RIGHT FOOT GREAT TOE AND 2ND TOE AMPUTATION;  Surgeon: Sage Jerona GAILS, MD;  Location: [facility] OR;  Service: Orthopedics;  Laterality: Right;   AMPUTATION Right 04/30/2018   Procedure: RIGHT TRANSMETATARSAL AMPUTATION;  Surgeon: Sage Jerona GAILS, MD;  Location: [facility] OR;  Service: Orthopedics;  Laterality: Right;   AMPUTATION Right 05/02/2022   Procedure: RIGHT BELOW KNEE AMPUTATION;  Surgeon: Sage Jerona GAILS, MD;  Location: [facility] OR;  Service: Orthopedics;  Laterality: Right;   APPLICATION OF WOUND VAC Right 06/13/2022   Procedure: APPLICATION OF WOUND VAC;  Surgeon: Sage Jerona GAILS, MD;  [facility]: [facility] OR;  Service: Orthopedics;  Laterality: Right;   APPLICATION OF WOUND VAC Left 11/14/2022   Procedure: APPLICATION OF WOUND VAC;  Surgeon: Sage Jerona GAILS,  MD;  Location: [facility] OR;  Service: Orthopedics;  Laterality: Left;   AV FISTULA PLACEMENT Left 04/02/2022   Procedure: LEFT ARM ARTERIOVENOUS (AV) FISTULA CREATION;  Surgeon: Serene Gaile ORN, MD;  Location: [facility] OR;  Service: Vascular;  Laterality: Left;  PERIPHERAL NERVE BLOCK   AV FISTULA PLACEMENT Right 04/27/2023   Procedure: RIGHT ARM BRACHIOBASILIC ARTERIOVENOUS (AV) FISTULA CREATION;  Surgeon: Magda Debby SAILOR, MD;  Location: [facility] OR;  Service: Vascular;  Laterality: Right;   BASCILIC VEIN TRANSPOSITION Left 07/31/2022   Procedure: LEFT ARM SECOND STAGE BASILIC VEIN TRANSPOSITION;  Surgeon: Serene Gaile ORN, MD;  [facility]: [facility] OR;  Service: Vascular;  Laterality: Left;   BIOPSY  05/27/2020   Procedure: BIOPSY;  Surgeon: Cindie Carlin POUR, DO;  Location: [facility];  Service: Endoscopy;;   CATARACT EXTRACTION, BILATERAL Bilateral  ?2013   COLONOSCOPY W/ POLYPECTOMY     COLONOSCOPY WITH PROPOFOL  N/A 03/13/2019   Procedure: COLONOSCOPY WITH PROPOFOL ;  Surgeon: Albertus Gordy HERO, MD;  Location: [facility] ENDOSCOPY;  Service: Gastroenterology;  Laterality: N/A;   CORONARY ANGIOPLASTY WITH STENT PLACEMENT  1999; 2007   1 + 1   ERCP N/A 02/03/2022   Procedure: ENDOSCOPIC RETROGRADE CHOLANGIOPANCREATOGRAPHY (ERCP);  Surgeon: Rollin Dover, MD;  Location: [facility] ENDOSCOPY;  Service: Gastroenterology;  Laterality: N/A;   ESOPHAGOGASTRODUODENOSCOPY N/A 02/12/2023   Procedure: ESOPHAGOGASTRODUODENOSCOPY (EGD);  Surgeon: Rollin Dover, MD;  Location: [facility] ENDOSCOPY;  Service: Gastroenterology;  Laterality: N/A;   ESOPHAGOGASTRODUODENOSCOPY (EGD) WITH PROPOFOL  N/A 03/13/2019   Procedure: ESOPHAGOGASTRODUODENOSCOPY (EGD) WITH PROPOFOL ;  Surgeon: Albertus Gordy HERO, MD;  Location: [name] ENDOSCOPY;  Service: Gastroenterology;  Laterality: N/A;   ESOPHAGOGASTRODUODENOSCOPY (EGD) WITH PROPOFOL  N/A 05/27/2020   Procedure: ESOPHAGOGASTRODUODENOSCOPY (EGD) WITH PROPOFOL ;  Surgeon: Cindie Carlin POUR, DO;  [facility];  Service: Endoscopy;  Laterality: N/A;   ESOPHAGOGASTRODUODENOSCOPY (EGD) WITH PROPOFOL  N/A 09/03/2022   Procedure: ESOPHAGOGASTRODUODENOSCOPY (EGD) WITH PROPOFOL ;  Surgeon: Rollin Dover, MD;  Location: [facility] ENDOSCOPY;  Service: Gastroenterology;  Laterality: N/A;   EYE SURGERY Bilateral    lazer   FLEXIBLE SIGMOIDOSCOPY N/A 05/23/2022   Procedure: FLEXIBLE SIGMOIDOSCOPY;  Surgeon: Rollin Dover, MD;  Location: [facility] ENDOSCOPY;  Service: Gastroenterology;  Laterality: N/A;   FLEXIBLE SIGMOIDOSCOPY N/A 05/24/2022   Procedure: FLEXIBLE SIGMOIDOSCOPY;  Surgeon: Federico Rosario BROCKS, MD;  Location: [facility] ENDOSCOPY;  Service: Gastroenterology;  Laterality: N/A;   FLEXIBLE SIGMOIDOSCOPY N/A 09/03/2022   Procedure: FLEXIBLE SIGMOIDOSCOPY;  Surgeon: Rollin Dover, MD;  Location: [facility] ENDOSCOPY;  Service: Gastroenterology;  Laterality: N/A;   HEMOSTASIS CLIP  PLACEMENT  03/13/2019   Procedure: HEMOSTASIS CLIP PLACEMENT;  Surgeon: Albertus Gordy HERO, MD;  Location: [facility] ENDOSCOPY;  Service: Gastroenterology;;   HEMOSTASIS CLIP PLACEMENT  05/23/2022   Procedure: HEMOSTASIS CLIP PLACEMENT;  Surgeon: Rollin Dover, MD;  Location: [facility] ENDOSCOPY;  Service: Gastroenterology;;   HEMOSTASIS CONTROL  05/24/2022   Procedure: HEMOSTASIS CONTROL;  Surgeon: Federico Rosario BROCKS, MD;  Location: [facility] ENDOSCOPY;  Service: Gastroenterology;;   HOT HEMOSTASIS N/A 05/23/2022   Procedure: HOT HEMOSTASIS (ARGON PLASMA COAGULATION/BICAP);  Surgeon: Rollin Dover, MD;  Location: [facility] ENDOSCOPY;  Service: Gastroenterology;  Laterality: N/A;   I & D EXTREMITY Left 05/05/2022   Procedure: IRRIGATION AND DEBRIDEMENT LEFT ARM AV FISTULA;  Surgeon: Gretta Lonni PARAS, MD;  Location: [facility] OR;  Service: Vascular;  Laterality: Left;   I & D EXTREMITY N/A 11/14/2022   Procedure: IRRIGATION AND DEBRIDEMENT OF LOWER EXTREMITY WOUND;  Surgeon: Harden Jerona GAILS, MD;  Location: [facility] OR;  Service: Orthopedics;  Laterality:  N/A;   INSERTION OF DIALYSIS CATHETER Right 04/02/2022   Procedure: INSERTION OF TUNNELED DIALYSIS CATHETER;  Surgeon: Serene Gaile ORN, MD;  Location: [facility] OR;  Service: Vascular;  Laterality: Right;   IR FLUORO GUIDE CV LINE RIGHT  08/10/2023   IR VENOCAVAGRAM IVC  08/10/2023   KNEE ARTHROSCOPY Left 10/25/2006   POLYPECTOMY  03/13/2019   Procedure: POLYPECTOMY;  Surgeon: Albertus Gordy HERO, MD;  Location: [facility] ENDOSCOPY;  Service: Gastroenterology;;   REMOVAL OF STONES  02/03/2022   Procedure: REMOVAL OF STONES;  Surgeon: Rollin Dover, MD;  Location: [facility] ENDOSCOPY;  Service: Gastroenterology;;   REVISON OF ARTERIOVENOUS FISTULA Left 08/20/2022   Procedure: REVISON OF LEFT ARM ARTERIOVENOUS FISTULA;  Surgeon: Serene Gaile ORN, MD;  Location[facility] OR;  Service: Vascular;  Laterality: Left;   RIGHT HEART CATH N/A 07/24/2017   Procedure: RIGHT HEART CATH;  Surgeon: Cherrie Toribio SAUNDERS, MD;  [facility]: [facility]  INVASIVE CV LAB;  Service: Cardiovascular;  Laterality: N/A;   RIGHT HEART CATHETERIZATION N/A 09/22/2013   Procedure: RIGHT HEART CATH;  Surgeon: Toribio SAUNDERS Cherrie, MD;  Location: [facility] CATH LAB;  Service: Cardiovascular;  Laterality: N/A;   SHOULDER ARTHROSCOPY WITH OPEN ROTATOR CUFF REPAIR Right 03/14/2014   Procedure: RIGHT SHOULDER ARTHROSCOPY WITH BICEPS RELEASE, OPEN SUBSCAPULA REPAIR, OPEN SUPRASPINATUS REPAIR.;  Surgeon: Cordella Glendia Hutchinson, MD;  Location: [facility] OR;  Service: Orthopedics;  Laterality: Right;   SPHINCTEROTOMY  02/03/2022   Procedure: SPHINCTEROTOMY;  Surgeon: Rollin Dover, MD;  Location: [facility] ENDOSCOPY;  Service: Gastroenterology;;   STUMP REVISION Right 06/13/2022   Procedure: REVISION RIGHT BELOW KNEE AMPUTATION;  Surgeon: Harden Jerona GAILS, MD;  Location: [facility] OR;  Service: Orthopedics;  Laterality: Right;   TEE WITHOUT CARDIOVERSION N/A 02/04/2022   Procedure: TRANSESOPHAGEAL ECHOCARDIOGRAM (TEE);  Surgeon: Cherrie Toribio SAUNDERS, MD;  Location: [facility] ENDOSCOPY;  Service: Cardiovascular;  Laterality: N/A;   THROMBECTOMY W/ EMBOLECTOMY Left 08/20/2022   Procedure: THROMBECTOMY OF LEFT ARM ARTERIOVENOUS FISTULA;  Surgeon: Serene Gaile ORN, MD;  Location: [facility] OR;  Service: Vascular;  Laterality: Left;   TOE AMPUTATION Right 02/24/2018   GREAT TOE AND 2ND TOE AMPUTATION   TUBAL LIGATION  1970's   UPPER EXTREMITY VENOGRAPHY N/A 07/31/2023   Procedure: UPPER EXTREMITY VENOGRAPHY;  Surgeon: Magda Debby SAILOR, MD;  Location: [facility];  Service: Cardiovascular;  Laterality: N/A;   Patient Active Problem List   Diagnosis Date Noted   UTI (urinary tract infection) 10/01/2023   Pyelonephritis 08/06/2023   Complicated UTI (urinary tract infection) 08/05/2023   Atrial fibrillation (HCC) 06/17/2023   Atopic dermatitis 06/17/2023   Hyperosmolar hyperglycemic state (HHS) (HCC) 02/05/2023   Gastroparesis 02/05/2023   Depression 01/29/2023   Intractable nausea and vomiting 01/20/2023    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 difficile colitis 06/05/2022   Spleen hematoma 06/05/2022   Dehiscence of amputation stump of right lower extremity (HCC) 06/05/2022   Rectal ulcer 05/27/2022   ESRD (end stage renal disease) (HCC) 05/27/2022   GI bleed  05/23/2022   Difficult intravenous access 05/23/2022   Gangrene of right foot (HCC) 05/02/2022   S/P BKA (below knee amputation) unilateral, right (HCC) 05/02/2022   Unspecified protein-calorie malnutrition (HCC) 04/15/2022   Secondary hyperparathyroidism of renal origin (HCC) 04/14/2022   Coagulation defect, unspecified (HCC) 04/09/2022   Acquired absence of other left toe(s) (HCC) 04/07/2022   Allergy, unspecified, initial encounter 04/07/2022   Dependence on renal dialysis (HCC) 04/07/2022   Gout due to renal impairment, unspecified site 04/07/2022   Hypertensive heart and chronic kidney disease with heart failure and with stage 5 chronic kidney disease, or end stage renal disease (HCC) 04/07/2022   Personal history of transient ischemic attack (TIA), and cerebral infarction without residual deficits 04/07/2022   Renal osteodystrophy 04/07/2022   Venous stasis ulcer of right calf (HCC) 03/31/2022   Fistula, colovaginal 03/26/2022   Diarrhea 03/26/2022   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   Sepsis without acute organ dysfunction (HCC)    Bacteremia    Acute pancreatitis  02/01/2022   Abdominal pain 02/01/2022   SIRS (systemic inflammatory response syndrome) (HCC) 02/01/2022   Transaminitis 02/01/2022   History of anemia due to chronic kidney disease 02/01/2022   Paroxysmal atrial fibrillation (HCC) 02/01/2022   Uncontrolled type 2 diabetes mellitus with hyperglycemia, with long-term current use of insulin  (HCC) 01/14/2022   NSTEMI (non-ST elevated myocardial infarction) (HCC) 03/05/2021   Acute renal failure superimposed on stage 4 chronic kidney disease (HCC) 08/22/2020   Hypoalbuminemia 05/25/2020   GERD (gastroesophageal reflux disease) 05/25/2020   Pressure injury of skin 05/17/2020   Acute on chronic combined systolic and diastolic congestive heart failure (HCC) 03/07/2020   Type 2 diabetes mellitus with diabetic polyneuropathy, with long-term current use of insulin  (HCC) 03/07/2020   Obesity, Class III, BMI 40-49.9 (morbid obesity) 03/07/2020   Common bile duct (CBD) obstruction 05/28/2019   Benign neoplasm of ascending colon    Benign neoplasm of transverse colon    Benign neoplasm of descending colon    Benign neoplasm of sigmoid colon    Gastric polyps    Hyperkalemia 03/11/2019   Prolonged QT interval 03/11/2019   Acute blood loss anemia 03/11/2019   Onychomycosis 06/21/2018   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)    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 (HCC)    PVD (peripheral vascular disease) (HCC) 10/26/2017   E-coli UTI 07/27/2017   Hypothyroidism 07/27/2017   AKI (acute kidney injury) (HCC)    PAH (pulmonary artery hypertension) (HCC)    Impaired ambulation 07/19/2017   Leg cramps 02/27/2017   Peripheral edema 01/12/2017   Diabetic neuropathy (HCC) 11/12/2016   Anemia of chronic disease 10/03/2015   Historical diagnosis of generalized anxiety disorder 10/03/2015   Secondary insomnia 10/03/2015   Acute bronchitis 09/05/2015   Hyperglycemia due to diabetes mellitus (HCC) 06/07/2015   Non  compliance with medical treatment 04/17/2014   Rotator cuff tear 03/14/2014   Obesity 09/23/2013   Chronic HFrEF (heart failure with reduced ejection fraction) (HCC) 06/03/2013   Hypotension 12/25/2012   Hypokalemia 12/25/2012   Hyponatremia 12/25/2012   Urinary incontinence    MDD (major depressive disorder), recurrent episode, moderate (HCC) 11/12/2010   RBBB (right bundle branch block)    Wide-complex tachycardia    Coronary artery disease    Hyperlipemia 01/22/2009   Chronic hypotension 01/22/2009    ONSET DATE: 03/09/2023 MD referral to PT  REFERRING DIAG: S88.111D (ICD-10-CM) - Below-knee amputation of right lower extremity, subsequent encounter  THERAPY DIAG:  Muscle weakness (generalized)  Other abnormalities of gait and mobility  Unsteadiness on feet  Impaired functional mobility, balance, gait, and endurance  Phantom limb syndrome with pain (HCC)  Rationale for Evaluation and Treatment: Rehabilitation  SUBJECTIVE:  SUBJECTIVE STATEMENT: Husband added plywood support to wheelchair under cushion.  Patient reports that it is making her reclined back further into the back of the wheelchair.  PERTINENT HISTORY: Right TTA 05/02/22, Lymphedema, idiopathic chronic venous HTN with ulcer, depression, colitis, diverticulitis, ESRD, gout, NSTEMI, DM2, polyneuropathy, PVD, CAD, right bundle branch block, mini strokes  PAIN:  Are you having pain?  Back pain today 6/10 Neck pain left side 6/10 & right side 7/10 Right shoulder 6/10  PRECAUTIONS: Fall and Other: No BP LUE  WEIGHT BEARING RESTRICTIONS: No  FALLS: Has patient fallen in last 6 months? No  LIVING ENVIRONMENT: Lives with: lives with their spouse and 2 small dogs Lives in: mobile home Home Access: Ramped entrance Home layout: One level Stairs: Yes: External: 6-7 steps; can reach both Has following equipment at home: Single point cane, Environmental consultant - 2 wheeled, Environmental consultant - 4 wheeled, Wheelchair (manual), [employer], Grab bars, and Ramped entry  OCCUPATION:  retired  PLOF: prior to amputation for ~year used RW in home & community  PATIENT GOALS:  to use prosthesis to walk, get out w/c in & out of car, get to bathroom.   OBJECTIVE:  COGNITION: Overall cognitive status:  Eval on 03/23/2023:   Within functional limits for tasks assessed  POSTURE:  Eval on 03/23/2023:  rounded shoulders, forward head, increased thoracic kyphosis, flexed trunk , and weight shift left  LOWER EXTREMITY ROM: ROM Right eval Left eval  Hip flexion    Hip extension    Hip abduction    Hip adduction    Hip internal rotation    Hip external rotation    Knee flexion    Knee extension    Ankle dorsiflexion    Ankle plantarflexion    Ankle inversion    Ankle eversion     (Blank rows = not tested)  LOWER EXTREMITY MMT:  MMT Right eval Left eval Right 09/09/23 Left 09/09/23  Hip flexion 3-/5 3-/5 3/5 3/5  Hip extension 2/5 2/5 3-/5 3-/5  Hip abduction 2+/5 2+/5 3-/5 3-/5  Hip adduction      Hip internal rotation      Hip external rotation      Knee flexion 3-/5 3-/5 3/5 3/5  Knee extension 3-/5 3-/5 3/5 3/5  Ankle dorsiflexion      Ankle plantarflexion      Ankle inversion      Ankle eversion      At Evaluation all strength testing is grossly seated and functionally standing / gait. (Blank rows = not tested)  TRANSFERS: 09/07/2023: Scooting transfers with minA and sit to/from stand transfers with modA.   07/27/2023: -Sit>stand from wc with RW: MinA with heavy vc for sequencing and offweighting LE -Stand>sit wc with RW: CGA with patient performing safe controlled descent  06/15/2023: -Sit to stand transfers with RW wheelchair<>mat table at varied height: 19in with maxA to rise to stand; 22in ModA rise to stand, 24in min-modA rise to stand. -stand pivot transfer with RW w/c to mat table with modA once standing.  -Lateral scooting wc<>table at level height SBA; vc for LE positioning for optimal use of  LE during transfer -lateral scoot R: modA due to fatigue and uneven surface transfer from low surface to higher  surface   05/04/2023: Squat pivot transfers with modA.  04/21/2023: Pt sit to stand w/c to //bar with one hand pushing on w/c and other hand on //bar with modA first time & MinA 2nd/ 3rd time.  Best is RUE on //bar & LUE on w/c.  Stand to sit reaching LUE to w/c with minA to control descent.   PT demo & verbal cues on sit to/from stand w/c to RW.  1st time modA 2 person. 2nd time maxA one person.  Stand to sit with minA and constant cueing. Scooting transfers with minA with w/c & mat direct contact & same ht.   Eval on 03/23/2023:  Sit to stand: Max A (two-person assist) from 22 w/c to rolling walker Stand to sit: Max A (two-person assist) rolling walker to wheelchair  FUNCTIONAL TESTs:   09/09/2023: Patient able to tolerate static stance for 5 minutes with BUE support on RW with supervision.  This was current certification goal set so that patient can stand long enough for her husband to assist with cleaning her after toileting.  07/27/2023: Patient stood with RW SBA for 54sec before c/o lightheadedness and sitting down to toilet. Resisted nudges applied posteriorly to simulate hygienic cleaning. VC for weight shifts when LE become tired.  BP: after standing 36min54sec 161/82 HR 92  06/15/2023: -Static stance for standing with RW; requiring Mod-MaxA for rise to stand d/t patient unsteady on feet posterior lean without proper righting strategy, SBA for stand   04/21/2023: Pt able to stand 60 sec with BUE on //bars with supervision.  Pt able to stand for 1 min with RW support with minA.  Eval on 03/23/2023: Patient maintained upright holding rolling walker with mod assist 2 people for safety for 30 seconds.  GAIT: 09/09/2023: Patient ambulated 30' with RW including turning 90*to position to sit with mod assistance  07/27/2023: Patient ambulated 57ft with narrow  bathroom entry and 90deg turn to sit on commode CGA with minA and heavy vc for progression and sequencing of RW. While turning to sit on commode patient lost balance laterally requiring mod-maxA from therapist and patient use of grab bar to regain balance.   06/17/2023: Patient ambulated 52ft (maximal tolerated distance) with RW ModA (2 person for safety). Required VC for LE sequencing and minA to steer walker and prevent tip over.   04/21/2023: Pt amb 5' in //bars with modA (2 person for safety) with PT verbal & tactile cues on sequence and weight shift.  Pt amb 10' with RW with maxA (2 person for safety) with PT verbal & tactile cues on sequence and weight shift.  Eval on 03/23/2023:  Patient took 2 steps (one per LE) with +2 max assist with rolling walker and TTA prosthesis.  Patient adducting prosthesis stepping on left foot with no awareness.  CURRENT PROSTHETIC WEAR ASSESSMENT: 09/07/2023: Patient and husband are independent with: skin check, prosthetic cleaning, ply sock cleaning, proper wear schedule/adjustment, residual limb care, correct ply sock adjustment, and proper weight-bearing schedule/adjustment Pt reports wear most of awake hours on dialysis and non-dialysis days.  She wears prosthesis to dialysis now to assist with transfers prn and is able to verbalize proper adjustment of prosthesis weight for weigh-in & weigh-out.   06/17/2023:  Independence with prosthetic care:  Patient and husband are independent with: skin check, prosthetic cleaning, ply sock cleaning, proper wear schedule/adjustment Patient is dependent with: residual limb care, correct ply sock adjustment, and proper weight-bearing schedule/adjustment Donning prosthesis: husband is independent  with proper donning. Patient requires MaxA. Patient can verbally direct someone how to properly assist. Doffing prosthesis: Husband is independent with proper donning. Patient requires modA. Patient can verbally direct someone how  to properly assist. Prosthetic wear tolerance: majority of awake hours including dialysis hours, 1x/day, 3-4 days/week Prosthetic weight bearing tolerance: able to tolerate of standing without complains of prosthetic limb pain. Time not limited by limb discomfort but limited by muscle fatigue.   Eval on 03/23/2023:   Patient is dependent with: skin check, residual limb care, prosthetic cleaning, ply sock cleaning, correct ply sock adjustment, proper wear schedule/adjustment, and proper weight-bearing schedule/adjustment Donning prosthesis: Max A Doffing prosthesis: Min A Prosthetic wear tolerance: 2-6 hours, 1x/day, 3-4 days/week Prosthetic weight bearing tolerance: 3 minutes with limb pain Edema: pitting edema Residual limb condition: No open areas, dry skin, normal color and temperature, cylindrical shape Prosthetic description: Silicone liner with pin lock suspension, total contact socket with flexible inner socket, sach foot    TODAY'S TREATMENT:                                                                                                             DATE: 11/18/2023    Prosthetic Training with Transtibial Prosthesis:  -Sit to stand with minA 3 reps with cues on technique.  -Standing 3 minutes with rolling walker support with supervision.  PT recommended patient to stand before walking if she has been sitting more than 10 to 15 minutes just to stretch to upright posture.  Patient and husband verbalized understanding - PT instructed husband in how to safely guard with his position and change sides that he is guarding if needed to help patient position herself to sit.   -PT demo and verbal cues on position of rolling walker and patient to decrease the number steps it takes to turn to position to sit. - Patient ambulated 56' turning 90* to chair on her right & 10' turning 90*to chair on her left with husband assisting and PT supervising. -PT educated patient and husband in how to  address instability during gait.  First try to stop and stand up straight to reestablish his balance.  If she is buckling and going to the floor and unable to stand up, then her husband should use the gait belt to lower her under control to the floor so that she does not get hurt.  They could then call the local fire department to assist with getting her off the floor as long as she was not injured.  Both verbalized understanding  Self-care: - PT recommended towel roll in lumbar region which patient reports improved back discomfort.  PT and husband discussed how to affixes to the wheelchair so it stayed in place at the proper height even when she stands up.  PT also recommended adjusting leg support so that her femur is parallel to the floor which improved comfort.  Patient had improved sitting posture with plywood under cushion to decrease [place] on the wheelchair.    TREATMENT:  DATE: 11/16/2023   Prosthetic Training with Transtibial Prosthesis:  -Sit to stand with minA 3 reps with PT cues on technique.  -Standing 3 minutes with rolling walker support with supervision with patient performing AROM for cervical flexion/extension, rotation both directions and side in both directions.   -Pt amb 22' x 2 with 90*to position in front of a target chair for sitting with RW with minA but constant verbal cues on pelvic weight shift to stance LE.  Patient's husband stood near her wheelchair so he could bring it in short time if needed but not falling directly behind patient to develop more security/confidence for gait without the chair right behind her.    TREATMENT:                                                                                                             DATE: 11/11/2023   Prosthetic Training with Transtibial Prosthesis:  -PT demo & verbal cues on technique / not locking knees out too  soon. Sit to stand with minA 3 reps with PT cues on technique. -Pt amb 3' with RW with minA but constant verbal cues on pelvic weight shift to stance LE.  Pt's husband following with w/c so straight path for gait.   -PT demo & verbal cues on set up with w/c & BSC 10' apart.  If she begins to collapse then use belt to lower under control to floor.  Then call local fire dept for helping getting up. Pt & husband verbalized understanding.    Therapeutic Exercise: -PT reviewed cervical & lumbar AROM flexion, extension, rotation & side bend with deep breath at end range and looking where she is moving.  PT emphasized importance for continuing exercises or pain & mobility can get worse. Pt & husband verbalized understanding.    TREATMENT:                                                                                                             DATE: 11/09/2023 Therapeutic Activities with Transtibial Prosthesis:  -Sit to stand with modA 3 reps & minA 1 rep with PT cues on technique. -PT demo & verbal cues on stand pivot transfer with RW for weight shift to stance LE.  Pt able to transfer to left & to right with minA once upright but requires verbal cues for each movement.   -PT demo & verbal cues on pelvic weight shift to stance LE.  Pt amb 23' with RW with minA but constant cues.  Pt's husband following with w/c so straight path for gait.  Self-Care: -PT simulated and verbal cues on cleaning privates after using BSC and dressing lower body.  Pt & husband verbalized understanding.     PATIENT EDUCATION: PATIENT EDUCATED ON FOLLOWING PROSTHETIC CARE: Education details: Use of stockinette under proximal liner to decrease itching sensation, no wounds presents to PT recommended not using Vive wear under liner Skin check, Prosthetic cleaning, Propper donning, and Proper wear schedule/adjustment Prosthetic wear tolerance: 3 hours 2x/day, 4 days/week Person educated: Patient and Spouse Education method:  Explanation, Tactile cues, and Verbal cues Education comprehension: verbalized understanding, verbal cues required, tactile cues required, and needs further education  HOME EXERCISE PROGRAM: Access Code: [email or web address] URL: [email or web address] Date: 09/23/2023 Prepared by: Grayce Spatz  Exercises - Supine Bridge  - 1 x daily - 4 x weekly - 2 sets - 5 reps - 2 seconds hold - Supine Lower Trunk Rotation  - 1 x daily - 4 x weekly - 2 sets - 5 reps - 5 seconds hold - Supine Heel Slide with Strap  - 1 x daily - 4 x weekly - 2 sets - 5 reps - 2-3 seconds hold - Supine Hip Abduction  - 1 x daily - 4 x weekly - 2 sets - 5 reps - 2-3 seconds hold - Seated Hip Flexion Toward Target  - 1 x daily - 4 x weekly - 2 sets - 5 reps - 5 seconds hold - Seated Eccentric Abdominal Lean Back  - 1 x daily - 4 x weekly - 2 sets - 5 reps - 5 seconds hold - Seated Sidebending  - 1 x daily - 4 x weekly - 2 sets - 5 reps - 5 seconds hold - Seated Trunk Rotation with Crossed Arms  - 1 x daily - 4 x weekly - 2 sets - 5 reps - 5 seconds hold - Seated March  - 1 x daily - 4 x weekly - 2 sets - 10 reps - 5 seconds hold - Seated Knee Flexion Extension AROM   - 1 x daily - 4 x weekly - 2 sets - 10 reps - 5 seconds hold    ASSESSMENT:  CLINICAL IMPRESSION: Patient's husband was able to assist with ambulation safely for short distances.  He appears to understand how to set up and safely assist.   OBJECTIVE IMPAIRMENTS: Abnormal gait, decreased activity tolerance, decreased balance, decreased endurance, decreased knowledge of condition, decreased knowledge of use of DME, decreased mobility, difficulty walking, decreased ROM, decreased strength, decreased safety awareness, postural dysfunction, prosthetic dependency , and pain.   ACTIVITY LIMITATIONS: standing, transfers, and locomotion level  PARTICIPATION LIMITATIONS: community activity, household mobility and dependency / burden of care on family  PERSONAL  FACTORS: Age, Fitness, Past/current experiences, Time since onset of injury/illness/exacerbation, and 3+ comorbidities: see PMH are also affecting patient's functional outcome.   REHAB POTENTIAL: Good  CLINICAL DECISION MAKING: Evolving/moderate complexity  EVALUATION COMPLEXITY: Moderate   GOALS: Goals reviewed with patient? Yes  SHORT TERM GOALS: Target date: 11/12/2023:  Patient & husband verbalized patient able to perform scooting transfer to / from [facility] and sit to/from stand [facility] to RW with husband's assistance safely. Baseline: SEE OBJECTIVE DATA Goal status:   MET  10/14/2023 2.  Patient able to sit to stand w/c to rolling walker with min assistance. Baseline: SEE OBJECTIVE DATA Goal status: partially MET (not consistently)  11/09/2023  3. Patient ambulates 45ft including turning 90* to position to sit with RW & prosthesis with modA.  Baseline: MET  11/16/2023  UPDATED  LONG TERM GOALS: Target date: 12/03/2023  Patient & husband continues to demonstrate & verbalize understanding of prosthetic care and wears prosthesis >80% of awake hours daily to enable safe utilization of prosthesis. Baseline: SEE OBJECTIVE DATA Goal status: Ongoing   11/18/2023  Patient and husband report patient is able to stand to enable cleaning after toileting safely with no issues Baseline: SEE OBJECTIVE DATA Goal status: Ongoing  11/18/2023  Patient able to perform sit to/from stand transfers with minA.  Baseline: SEE OBJECTIVE DATA Goal status: Ongoing   11/18/2023  Patient ambulates 20ft including turning 90* to position to sit with prosthesis & RW with minA with husband's assistance safely. Baseline: SEE OBJECTIVE DATA Goal status: updated  11/18/2023  Patient understanding of ongoing HEP. Baseline: SEE OBJECTIVE DATA Goal status:  Ongoing  11/18/2023  PLAN:  PT FREQUENCY: 2x/week  PT DURATION: 12 weeks  PLANNED INTERVENTIONS: 97164- PT Re-evaluation, 97110-Therapeutic exercises, 97530-  Therapeutic activity, 97112- Neuromuscular re-education, [phone]- Self Care, [id number]- Gait training, [phone]- Prosthetic training, Patient/Family education, Balance training, DME instructions, Therapeutic exercises, Therapeutic activity, Neuromuscular re-education, Gait training, and Self Care  PLAN FOR NEXT SESSION:     Work towards LTG's.  Continue to encourage daily use of BSC.  Continue with standing. Gait activities walking between 2 chairs initiating husband assisting with PT supervising.    Tevan Marian, PT, DPT 11/18/2023, 4:23 PM

## 2023-11-18 NOTE — Telephone Encounter (Signed)
 SABRA

## 2023-11-19 MED ORDER — CLONAZEPAM 0.5 MG PO TBDP
0.5000 mg | ORAL_TABLET | Freq: Two times a day (BID) | ORAL | 1 refills | Status: DC
Start: 2023-11-19 — End: 2023-12-30

## 2023-11-20 ENCOUNTER — Other Ambulatory Visit: Payer: Self-pay | Admitting: Family Medicine

## 2023-11-20 DIAGNOSIS — E7800 Pure hypercholesterolemia, unspecified: Secondary | ICD-10-CM | POA: Diagnosis not present

## 2023-11-20 DIAGNOSIS — R748 Abnormal levels of other serum enzymes: Secondary | ICD-10-CM | POA: Diagnosis not present

## 2023-11-20 DIAGNOSIS — E11319 Type 2 diabetes mellitus with unspecified diabetic retinopathy without macular edema: Secondary | ICD-10-CM | POA: Diagnosis not present

## 2023-11-20 DIAGNOSIS — I2510 Atherosclerotic heart disease of native coronary artery without angina pectoris: Secondary | ICD-10-CM | POA: Diagnosis not present

## 2023-11-20 DIAGNOSIS — I10 Essential (primary) hypertension: Secondary | ICD-10-CM | POA: Diagnosis not present

## 2023-11-20 DIAGNOSIS — E1165 Type 2 diabetes mellitus with hyperglycemia: Secondary | ICD-10-CM | POA: Diagnosis not present

## 2023-11-20 DIAGNOSIS — E11621 Type 2 diabetes mellitus with foot ulcer: Secondary | ICD-10-CM | POA: Diagnosis not present

## 2023-11-20 DIAGNOSIS — E039 Hypothyroidism, unspecified: Secondary | ICD-10-CM | POA: Diagnosis not present

## 2023-11-20 DIAGNOSIS — G609 Hereditary and idiopathic neuropathy, unspecified: Secondary | ICD-10-CM | POA: Diagnosis not present

## 2023-11-20 DIAGNOSIS — N186 End stage renal disease: Secondary | ICD-10-CM | POA: Diagnosis not present

## 2023-11-20 DIAGNOSIS — F339 Major depressive disorder, recurrent, unspecified: Secondary | ICD-10-CM

## 2023-11-20 DIAGNOSIS — Z4681 Encounter for fitting and adjustment of insulin pump: Secondary | ICD-10-CM | POA: Diagnosis not present

## 2023-11-20 NOTE — Telephone Encounter (Unsigned)
 Copied from CRM [id number]. Topic: Clinical - Medication Refill >> Nov 20, 2023  4:32 PM Tiffini S wrote: Medication:  [email or web address]  (KLONOPIN ) 0.5 MG disintegrating tablet Has the patient contacted their pharmacy? Yes (Agent: If no, request that the patient contact the pharmacy for the refill. If patient does not wish to contact the pharmacy document the reason why and proceed with request.) (Agent: If yes, when and what did the pharmacy advise?)  This is the patient's preferred pharmacy:  [id number] [place], [place] - [address] [place] [address] Phone: [phone] Fax: [phone]  Is this the correct pharmacy for this prescription? Yes If no, delete pharmacy and type the correct one.   Has the prescription been filled recently? Yes  Is the patient out of the medication? Yes, patient have three tablets left   Has the patient been seen for an appointment in the last year OR does the patient have an upcoming appointment? Yes  Can we respond through MyChart? No, patient asked for a phone number   Agent: Please be advised that Rx refills may take up to 3 business days. We ask that you follow-up with your pharmacy.

## 2023-11-21 DIAGNOSIS — Z992 Dependence on renal dialysis: Secondary | ICD-10-CM | POA: Diagnosis not present

## 2023-11-21 DIAGNOSIS — N2581 Secondary hyperparathyroidism of renal origin: Secondary | ICD-10-CM | POA: Diagnosis not present

## 2023-11-21 DIAGNOSIS — N186 End stage renal disease: Secondary | ICD-10-CM | POA: Diagnosis not present

## 2023-11-23 ENCOUNTER — Encounter: Payer: Self-pay | Admitting: Physical Therapy

## 2023-11-23 ENCOUNTER — Ambulatory Visit: Admitting: Physical Therapy

## 2023-11-23 DIAGNOSIS — M6281 Muscle weakness (generalized): Secondary | ICD-10-CM

## 2023-11-23 DIAGNOSIS — Z7409 Other reduced mobility: Secondary | ICD-10-CM | POA: Diagnosis not present

## 2023-11-23 DIAGNOSIS — R2689 Other abnormalities of gait and mobility: Secondary | ICD-10-CM | POA: Diagnosis not present

## 2023-11-23 DIAGNOSIS — R2681 Unsteadiness on feet: Secondary | ICD-10-CM | POA: Diagnosis not present

## 2023-11-23 DIAGNOSIS — N186 End stage renal disease: Secondary | ICD-10-CM | POA: Diagnosis not present

## 2023-11-23 DIAGNOSIS — Z992 Dependence on renal dialysis: Secondary | ICD-10-CM | POA: Diagnosis not present

## 2023-11-23 DIAGNOSIS — N179 Acute kidney failure, unspecified: Secondary | ICD-10-CM | POA: Diagnosis not present

## 2023-11-23 NOTE — Therapy (Signed)
 OUTPATIENT PHYSICAL THERAPY PROSTHETIC TREATMENT  Patient Name: Susan [name] MRN: [id number] DOB:1950/08/26, 73 y.o., female Today's Date: 11/23/2023  PCP: Duanne Butler DASEN, MD  REFERRING PROVIDER:  Jerona Sage, MD  END OF SESSION:  PT End of Session - 11/23/23 1347     Visit Number 52    Number of Visits 60    Date for PT Re-Evaluation 12/03/23    Authorization Type [employer]    PT Start Time 1347    PT Stop Time 1430    PT Time Calculation (min) 43 min    Equipment Utilized During Treatment Gait belt    Activity Tolerance Patient tolerated treatment well;Patient limited by fatigue    Behavior During Therapy WFL for tasks assessed/performed                    Past Medical History:  Diagnosis Date   Anemia    hx   Anxiety    Arthritis    generalized (03/15/2014)   CAD (coronary artery disease)    MI in 2000 - MI  2007 - treated bare metal stent (no nuclear since then as 9/11)   Carotid artery disease (HCC)    Chronic diastolic heart failure (HCC)    a) ECHO (08/2013) EF 55-60% and RV function nl b) RHC (08/2013) RA 4, RV 30/5/7, PA 25/10 (16), PCWP 7, Fick CO/CI 6.3/2.7, PVR 1.5 WU, PA 61 and 66%   Daily headache    ~ every other day; since I fell in June (03/15/2014)   Depression    Diabetic retinopathy (HCC)    Dyslipidemia    ESRD (end stage renal disease) (HCC)    Dialysis on Tues Thurs Sat   Exertional shortness of breath    GERD (gastroesophageal reflux disease)    History of blood transfusion    History of kidney stones    HTN (hypertension)    Hypothyroidism    Myocardial infarction (HCC)    Obesity    Osteoarthritis    PAF (paroxysmal atrial fibrillation) (HCC)    Peripheral neuropathy    bilateral feet/hands   PONV (postoperative nausea and vomiting)    RBBB (right bundle branch block)    Old   Stroke (HCC)    mini strokes   Type II diabetes mellitus (HCC)    Type II, Barnet libre left upper arm. patient has  omnipod insulin  pump with Novolin R Insulin    Past Surgical History:  Procedure Laterality Date   A/V FISTULAGRAM Left 11/07/2022   Procedure: A/V Fistulagram;  Surgeon: Magda Debby SAILOR, MD;  [facility]: [facility];  Service: Cardiovascular;  Laterality: Left;   ABDOMINAL HYSTERECTOMY  1980's   AMPUTATION Right 02/24/2018   Procedure: RIGHT FOOT GREAT TOE AND 2ND TOE AMPUTATION;  Surgeon: Sage Jerona GAILS, MD;  Location: [facility] OR;  Service: Orthopedics;  Laterality: Right;   AMPUTATION Right 04/30/2018   Procedure: RIGHT TRANSMETATARSAL AMPUTATION;  Surgeon: Sage Jerona GAILS, MD;  Location: [facility] OR;  Service: Orthopedics;  Laterality: Right;   AMPUTATION Right 05/02/2022   Procedure: RIGHT BELOW KNEE AMPUTATION;  Surgeon: Sage Jerona GAILS, MD;  Location: [facility] OR;  Service: Orthopedics;  Laterality: Right;   APPLICATION OF WOUND VAC Right 06/13/2022   Procedure: APPLICATION OF WOUND VAC;  Surgeon: Sage Jerona GAILS, MD;  Location: [facility] OR;  Service: Orthopedics;  Laterality: Right;   APPLICATION OF WOUND VAC Left 11/14/2022   Procedure: APPLICATION OF WOUND VAC;  Surgeon: Sage Jerona  V, MD;  Location: [facility] OR;  Service: Orthopedics;  Laterality: Left;   AV FISTULA PLACEMENT Left 04/02/2022   Procedure: LEFT ARM ARTERIOVENOUS (AV) FISTULA CREATION;  Surgeon: Serene Gaile ORN, MD;  Location: [facility] OR;  Service: Vascular;  Laterality: Left;  PERIPHERAL NERVE BLOCK   AV FISTULA PLACEMENT Right 04/27/2023   Procedure: RIGHT ARM BRACHIOBASILIC ARTERIOVENOUS (AV) FISTULA CREATION;  Surgeon: Magda Debby SAILOR, MD;  Location: [facility] OR;  Service: Vascular;  Laterality: Right;   BASCILIC VEIN TRANSPOSITION Left 07/31/2022   Procedure: LEFT ARM SECOND STAGE BASILIC VEIN TRANSPOSITION;  Surgeon: Serene Gaile ORN, MD;  [facility]: [facility] OR;  Service: Vascular;  Laterality: Left;   BIOPSY  05/27/2020   Procedure: BIOPSY;  Surgeon: Cindie Carlin POUR, DO;  Location: [facility];  Service: Endoscopy;;   CATARACT EXTRACTION, BILATERAL Bilateral  ?2013   COLONOSCOPY W/ POLYPECTOMY     COLONOSCOPY WITH PROPOFOL  N/A 03/13/2019   Procedure: COLONOSCOPY WITH PROPOFOL ;  Surgeon: Albertus Gordy HERO, MD;  Location: [facility] ENDOSCOPY;  Service: Gastroenterology;  Laterality: N/A;   CORONARY ANGIOPLASTY WITH STENT PLACEMENT  1999; 2007   1 + 1   ERCP N/A 02/03/2022   Procedure: ENDOSCOPIC RETROGRADE CHOLANGIOPANCREATOGRAPHY (ERCP);  Surgeon: Rollin Dover, MD;  Location: [facility] ENDOSCOPY;  Service: Gastroenterology;  Laterality: N/A;   ESOPHAGOGASTRODUODENOSCOPY N/A 02/12/2023   Procedure: ESOPHAGOGASTRODUODENOSCOPY (EGD);  Surgeon: Rollin Dover, MD;  Location: [facility] ENDOSCOPY;  Service: Gastroenterology;  Laterality: N/A;   ESOPHAGOGASTRODUODENOSCOPY (EGD) WITH PROPOFOL  N/A 03/13/2019   Procedure: ESOPHAGOGASTRODUODENOSCOPY (EGD) WITH PROPOFOL ;  Surgeon: Albertus Gordy HERO, MD;  Location: [facility] ENDOSCOPY;  Service: Gastroenterology;  Laterality: N/A;   ESOPHAGOGASTRODUODENOSCOPY (EGD) WITH PROPOFOL  N/A 05/27/2020   Procedure: ESOPHAGOGASTRODUODENOSCOPY (EGD) WITH PROPOFOL ;  Surgeon: Cindie Carlin POUR, DO;  [facility];  Service: Endoscopy;  Laterality: N/A;   ESOPHAGOGASTRODUODENOSCOPY (EGD) WITH PROPOFOL  N/A 09/03/2022   Procedure: ESOPHAGOGASTRODUODENOSCOPY (EGD) WITH PROPOFOL ;  Surgeon: Rollin Dover, MD;  Location: [facility] ENDOSCOPY;  Service: Gastroenterology;  Laterality: N/A;   EYE SURGERY Bilateral    lazer   FLEXIBLE SIGMOIDOSCOPY N/A 05/23/2022   Procedure: FLEXIBLE SIGMOIDOSCOPY;  Surgeon: Rollin Dover, MD;  Location: [facility] ENDOSCOPY;  Service: Gastroenterology;  Laterality: N/A;   FLEXIBLE SIGMOIDOSCOPY N/A 05/24/2022   Procedure: FLEXIBLE SIGMOIDOSCOPY;  Surgeon: Federico Rosario BROCKS, MD;  Location: [facility] ENDOSCOPY;  Service: Gastroenterology;  Laterality: N/A;   FLEXIBLE SIGMOIDOSCOPY N/A 09/03/2022   Procedure: FLEXIBLE SIGMOIDOSCOPY;  Surgeon: Rollin Dover, MD;  Location: [facility] ENDOSCOPY;  Service: Gastroenterology;  Laterality: N/A;   HEMOSTASIS CLIP  PLACEMENT  03/13/2019   Procedure: HEMOSTASIS CLIP PLACEMENT;  Surgeon: Albertus Gordy HERO, MD;  Location: [facility] ENDOSCOPY;  Service: Gastroenterology;;   HEMOSTASIS CLIP PLACEMENT  05/23/2022   Procedure: HEMOSTASIS CLIP PLACEMENT;  Surgeon: Rollin Dover, MD;  Location: [facility] ENDOSCOPY;  Service: Gastroenterology;;   HEMOSTASIS CONTROL  05/24/2022   Procedure: HEMOSTASIS CONTROL;  Surgeon: Federico Rosario BROCKS, MD;  Location: [facility] ENDOSCOPY;  Service: Gastroenterology;;   HOT HEMOSTASIS N/A 05/23/2022   Procedure: HOT HEMOSTASIS (ARGON PLASMA COAGULATION/BICAP);  Surgeon: Rollin Dover, MD;  Location: [facility] ENDOSCOPY;  Service: Gastroenterology;  Laterality: N/A;   I & D EXTREMITY Left 05/05/2022   Procedure: IRRIGATION AND DEBRIDEMENT LEFT ARM AV FISTULA;  Surgeon: Gretta Lonni PARAS, MD;  Location: [facility] OR;  Service: Vascular;  Laterality: Left;   I & D EXTREMITY N/A 11/14/2022   Procedure: IRRIGATION AND DEBRIDEMENT OF LOWER EXTREMITY WOUND;  Surgeon: Harden Jerona GAILS, MD;  Location: [facility] OR;  Service: Orthopedics;  Laterality: N/A;   INSERTION OF DIALYSIS CATHETER Right 04/02/2022   Procedure: INSERTION OF TUNNELED DIALYSIS CATHETER;  Surgeon: Serene Gaile ORN, MD;  Location: [facility] OR;  Service: Vascular;  Laterality: Right;   IR FLUORO GUIDE CV LINE RIGHT  08/10/2023   IR VENOCAVAGRAM IVC  08/10/2023   KNEE ARTHROSCOPY Left 10/25/2006   POLYPECTOMY  03/13/2019   Procedure: POLYPECTOMY;  Surgeon: Albertus Gordy HERO, MD;  Location: [facility] ENDOSCOPY;  Service: Gastroenterology;;   REMOVAL OF STONES  02/03/2022   Procedure: REMOVAL OF STONES;  Surgeon: Rollin Dover, MD;  Location: [facility] ENDOSCOPY;  Service: Gastroenterology;;   REVISON OF ARTERIOVENOUS FISTULA Left 08/20/2022   Procedure: REVISON OF LEFT ARM ARTERIOVENOUS FISTULA;  Surgeon: Serene Gaile ORN, MD;  [facility] OR;  Service: Vascular;  Laterality: Left;   RIGHT HEART CATH N/A 07/24/2017   Procedure: RIGHT HEART CATH;  Surgeon: Cherrie Toribio SAUNDERS, MD;  [facility]  INV[facility]IVE CV LAB;  Service: Cardiovascular;  Laterality: N/A;   RIGHT HEART CATHETERIZATION N/A 09/22/2013   Procedure: RIGHT HEART CATH;  Surgeon: Toribio SAUNDERS Cherrie, MD;  Location: [facility] CATH LAB;  Service: Cardiovascular;  Laterality: N/A;   SHOULDER ARTHROSCOPY WITH OPEN ROTATOR CUFF REPAIR Right 03/14/2014   Procedure: RIGHT SHOULDER ARTHROSCOPY WITH BICEPS RELEASE, OPEN SUBSCAPULA REPAIR, OPEN SUPRASPINATUS REPAIR.;  Surgeon: Cordella Glendia Hutchinson, MD;  Location: [facility] OR;  Service: Orthopedics;  Laterality: Right;   SPHINCTEROTOMY  02/03/2022   Procedure: SPHINCTEROTOMY;  Surgeon: Rollin Dover, MD;  Location: [facility] ENDOSCOPY;  Service: Gastroenterology;;   STUMP REVISION Right 06/13/2022   Procedure: REVISION RIGHT BELOW KNEE AMPUTATION;  Surgeon: Harden Jerona GAILS, MD;  Location: [facility] OR;  Service: Orthopedics;  Laterality: Right;   TEE WITHOUT CARDIOVERSION N/A 02/04/2022   Procedure: TRANSESOPHAGEAL ECHOCARDIOGRAM (TEE);  Surgeon: Cherrie Toribio SAUNDERS, MD;  Location: [facility] ENDOSCOPY;  Service: Cardiovascular;  Laterality: N/A;   THROMBECTOMY W/ EMBOLECTOMY Left 08/20/2022   Procedure: THROMBECTOMY OF LEFT ARM ARTERIOVENOUS FISTULA;  Surgeon: Serene Gaile ORN, MD;  Location: [facility] OR;  Service: Vascular;  Laterality: Left;   TOE AMPUTATION Right 02/24/2018   GREAT TOE AND 2ND TOE AMPUTATION   TUBAL LIGATION  1970's   UPPER EXTREMITY VENOGRAPHY N/A 07/31/2023   Procedure: UPPER EXTREMITY VENOGRAPHY;  Surgeon: Magda Debby SAILOR, MD;  [facility]: [facility];  Service: Cardiovascular;  Laterality: N/A;   Patient Active Problem List   Diagnosis Date Noted   UTI (urinary tract infection) 10/01/2023   Pyelonephritis 08/06/2023   Complicated UTI (urinary tract infection) 08/05/2023   Atrial fibrillation (HCC) 06/17/2023   Atopic dermatitis 06/17/2023   Hyperosmolar hyperglycemic state (HHS) (HCC) 02/05/2023   Gastroparesis 02/05/2023   Depression 01/29/2023   Intractable nausea and vomiting 01/20/2023    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 difficile colitis 06/05/2022   Spleen hematoma 06/05/2022   Dehiscence of amputation stump of right lower extremity (HCC) 06/05/2022   Rectal ulcer 05/27/2022   ESRD (end stage renal disease) (HCC) 05/27/2022   GI  bleed 05/23/2022   Difficult intravenous access 05/23/2022   Gangrene of right foot (HCC) 05/02/2022   S/P BKA (below knee amputation) unilateral, right (HCC) 05/02/2022   Unspecified protein-calorie malnutrition (HCC) 04/15/2022   Secondary hyperparathyroidism of renal origin (HCC) 04/14/2022   Coagulation defect, unspecified (HCC) 04/09/2022   Acquired absence of other left toe(s) (HCC) 04/07/2022   Allergy, unspecified, initial encounter 04/07/2022   Dependence on renal dialysis (HCC) 04/07/2022   Gout due to renal impairment, unspecified site 04/07/2022   Hypertensive heart and chronic kidney disease with heart failure and with stage 5 chronic kidney disease, or end stage renal disease (HCC) 04/07/2022   Personal history of transient ischemic attack (TIA), and cerebral infarction without residual deficits 04/07/2022   Renal osteodystrophy 04/07/2022   Venous stasis ulcer of right calf (HCC) 03/31/2022   Fistula, colovaginal 03/26/2022   Diarrhea 03/26/2022   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   Sepsis without acute organ dysfunction (HCC)    Bacteremia    Acute pancreatitis  02/01/2022   Abdominal pain 02/01/2022   SIRS (systemic inflammatory response syndrome) (HCC) 02/01/2022   Transaminitis 02/01/2022   History of anemia due to chronic kidney disease 02/01/2022   Paroxysmal atrial fibrillation (HCC) 02/01/2022   Uncontrolled type 2 diabetes mellitus with hyperglycemia, with long-term current use of insulin  (HCC) 01/14/2022   NSTEMI (non-ST elevated myocardial infarction) (HCC) 03/05/2021   Acute renal failure superimposed on stage 4 chronic kidney disease (HCC) 08/22/2020   Hypoalbuminemia 05/25/2020   GERD (gastroesophageal reflux disease) 05/25/2020   Pressure injury of skin 05/17/2020   Acute on chronic combined systolic and diastolic congestive heart failure (HCC) 03/07/2020   Type 2 diabetes mellitus with diabetic polyneuropathy, with long-term current use of insulin  (HCC) 03/07/2020   Obesity, Class III, BMI 40-49.9 (morbid obesity) 03/07/2020   Common bile duct (CBD) obstruction 05/28/2019   Benign neoplasm of ascending colon    Benign neoplasm of transverse colon    Benign neoplasm of descending colon    Benign neoplasm of sigmoid colon    Gastric polyps    Hyperkalemia 03/11/2019   Prolonged QT interval 03/11/2019   Acute blood loss anemia 03/11/2019   Onychomycosis 06/21/2018   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)    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 (HCC)    PVD (peripheral vascular disease) (HCC) 10/26/2017   E-coli UTI 07/27/2017   Hypothyroidism 07/27/2017   AKI (acute kidney injury) (HCC)    PAH (pulmonary artery hypertension) (HCC)    Impaired ambulation 07/19/2017   Leg cramps 02/27/2017   Peripheral edema 01/12/2017   Diabetic neuropathy (HCC) 11/12/2016   Anemia of chronic disease 10/03/2015   Historical diagnosis of generalized anxiety disorder 10/03/2015   Secondary insomnia 10/03/2015   Acute bronchitis 09/05/2015   Hyperglycemia due to diabetes mellitus (HCC) 06/07/2015   Non  compliance with medical treatment 04/17/2014   Rotator cuff tear 03/14/2014   Obesity 09/23/2013   Chronic HFrEF (heart failure with reduced ejection fraction) (HCC) 06/03/2013   Hypotension 12/25/2012   Hypokalemia 12/25/2012   Hyponatremia 12/25/2012   Urinary incontinence    MDD (major depressive disorder), recurrent episode, moderate (HCC) 11/12/2010   RBBB (right bundle branch block)    Wide-complex tachycardia    Coronary artery disease    Hyperlipemia 01/22/2009   Chronic hypotension 01/22/2009    ONSET DATE: 03/09/2023 MD referral to PT  REFERRING DIAG: S88.111D (ICD-10-CM) - Below-knee amputation of right lower extremity, subsequent encounter  THERAPY DIAG:  Muscle weakness (generalized)  Other abnormalities of gait and mobility  Unsteadiness on feet  Impaired functional mobility, balance, gait, and endurance  Rationale for Evaluation and Treatment: Rehabilitation  SUBJECTIVE:  SUBJECTIVE STATEMENT: The towel roll did not stay in position for her low back.  She walked with her husband with RW 10-15'.  No near falls   PERTINENT HISTORY: Right TTA 05/02/22, Lymphedema, idiopathic chronic venous HTN with ulcer, depression, colitis, diverticulitis, ESRD, gout, NSTEMI, DM2, polyneuropathy, PVD, CAD, right bundle branch block, mini strokes  PAIN:  Are you having pain?  Back pain today  5/10 Neck pain left side 6/10 & right side 7/10 Right shoulder 7/10  PRECAUTIONS: Fall and Other: No BP LUE  WEIGHT BEARING RESTRICTIONS: No  FALLS: Has patient fallen in last 6 months? No  LIVING ENVIRONMENT: Lives with: lives with their spouse and 2 small dogs Lives in: mobile home Home Access: Ramped entrance Home layout: One level Stairs: Yes: External: 6-7 steps; can reach both Has following equipment at home: Single point cane, Environmental consultant - 2 wheeled, Environmental consultant - 4 wheeled, Wheelchair (manual), [employer], Grab bars, and Ramped entry  OCCUPATION:  retired  PLOF: prior to  amputation for ~year used RW in home & community  PATIENT GOALS:  to use prosthesis to walk, get out w/c in & out of car, get to bathroom.   OBJECTIVE:  COGNITION: Overall cognitive status:  Eval on 03/23/2023:   Within functional limits for tasks assessed  POSTURE:  Eval on 03/23/2023:  rounded shoulders, forward head, increased thoracic kyphosis, flexed trunk , and weight shift left  LOWER EXTREMITY ROM: ROM Right eval Left eval  Hip flexion    Hip extension    Hip abduction    Hip adduction    Hip internal rotation    Hip external rotation    Knee flexion    Knee extension    Ankle dorsiflexion    Ankle plantarflexion    Ankle inversion    Ankle eversion     (Blank rows = not tested)  LOWER EXTREMITY MMT:  MMT Right eval Left eval Right 09/09/23 Left 09/09/23  Hip flexion 3-/5 3-/5 3/5 3/5  Hip extension 2/5 2/5 3-/5 3-/5  Hip abduction 2+/5 2+/5 3-/5 3-/5  Hip adduction      Hip internal rotation      Hip external rotation      Knee flexion 3-/5 3-/5 3/5 3/5  Knee extension 3-/5 3-/5 3/5 3/5  Ankle dorsiflexion      Ankle plantarflexion      Ankle inversion      Ankle eversion      At Evaluation all strength testing is grossly seated and functionally standing / gait. (Blank rows = not tested)  TRANSFERS: 09/07/2023: Scooting transfers with minA and sit to/from stand transfers with modA.   07/27/2023: -Sit>stand from wc with RW: MinA with heavy vc for sequencing and offweighting LE -Stand>sit wc with RW: CGA with patient performing safe controlled descent  06/15/2023: -Sit to stand transfers with RW wheelchair<>mat table at varied height: 19in with maxA to rise to stand; 22in ModA rise to stand, 24in min-modA rise to stand. -stand pivot transfer with RW w/c to mat table with modA once standing.  -Lateral scooting wc<>table at level height SBA; vc for LE positioning for optimal use of LE during transfer -lateral scoot R: modA due to fatigue and uneven surface  transfer from low surface to higher surface   05/04/2023: Squat  pivot transfers with modA.  04/21/2023: Pt sit to stand w/c to //bar with one hand pushing on w/c and other hand on //bar with modA first time & MinA 2nd/ 3rd time.  Best is RUE on //bar & LUE on w/c.  Stand to sit reaching LUE to w/c with minA to control descent.   PT demo & verbal cues on sit to/from stand w/c to RW.  1st time modA 2 person. 2nd time maxA one person.  Stand to sit with minA and constant cueing. Scooting transfers with minA with w/c & mat direct contact & same ht.   Eval on 03/23/2023:  Sit to stand: Max A (two-person assist) from 22 w/c to rolling walker Stand to sit: Max A (two-person assist) rolling walker to wheelchair  FUNCTIONAL TESTs:   09/09/2023: Patient able to tolerate static stance for 5 minutes with BUE support on RW with supervision.  This was current certification goal set so that patient can stand long enough for her husband to assist with cleaning her after toileting.  07/27/2023: Patient stood with RW SBA for 54sec before c/o lightheadedness and sitting down to toilet. Resisted nudges applied posteriorly to simulate hygienic cleaning. VC for weight shifts when LE become tired.  BP: after standing 42min54sec 161/82 HR 92  06/15/2023: -Static stance for standing with RW; requiring Mod-MaxA for rise to stand d/t patient unsteady on feet posterior lean without proper righting strategy, SBA for stand   04/21/2023: Pt able to stand 60 sec with BUE on //bars with supervision.  Pt able to stand for 1 min with RW support with minA.  Eval on 03/23/2023: Patient maintained upright holding rolling walker with mod assist 2 people for safety for 30 seconds.  GAIT: 09/09/2023: Patient ambulated 30' with RW including turning 90*to position to sit with mod assistance  07/27/2023: Patient ambulated 15ft with narrow bathroom entry and 90deg turn to sit on commode CGA with minA and heavy vc for  progression and sequencing of RW. While turning to sit on commode patient lost balance laterally requiring mod-maxA from therapist and patient use of grab bar to regain balance.   06/17/2023: Patient ambulated 64ft (maximal tolerated distance) with RW ModA (2 person for safety). Required VC for LE sequencing and minA to steer walker and prevent tip over.   04/21/2023: Pt amb 5' in //bars with modA (2 person for safety) with PT verbal & tactile cues on sequence and weight shift.  Pt amb 10' with RW with maxA (2 person for safety) with PT verbal & tactile cues on sequence and weight shift.  Eval on 03/23/2023:  Patient took 2 steps (one per LE) with +2 max assist with rolling walker and TTA prosthesis.  Patient adducting prosthesis stepping on left foot with no awareness.  CURRENT PROSTHETIC WEAR ASSESSMENT: 09/07/2023: Patient and husband are independent with: skin check, prosthetic cleaning, ply sock cleaning, proper wear schedule/adjustment, residual limb care, correct ply sock adjustment, and proper weight-bearing schedule/adjustment Pt reports wear most of awake hours on dialysis and non-dialysis days.  She wears prosthesis to dialysis now to assist with transfers prn and is able to verbalize proper adjustment of prosthesis weight for weigh-in & weigh-out.   06/17/2023:  Independence with prosthetic care:  Patient and husband are independent with: skin check, prosthetic cleaning, ply sock cleaning, proper wear schedule/adjustment Patient is dependent with: residual limb care, correct ply sock adjustment, and proper weight-bearing schedule/adjustment Donning prosthesis: husband is independent with proper donning. Patient requires  MaxA. Patient can verbally direct someone how to properly assist. Doffing prosthesis: Husband is independent with proper donning. Patient requires modA. Patient can verbally direct someone how to properly assist. Prosthetic wear tolerance: majority of awake hours  including dialysis hours, 1x/day, 3-4 days/week Prosthetic weight bearing tolerance: able to tolerate of standing without complains of prosthetic limb pain. Time not limited by limb discomfort but limited by muscle fatigue.   Eval on 03/23/2023:   Patient is dependent with: skin check, residual limb care, prosthetic cleaning, ply sock cleaning, correct ply sock adjustment, proper wear schedule/adjustment, and proper weight-bearing schedule/adjustment Donning prosthesis: Max A Doffing prosthesis: Min A Prosthetic wear tolerance: 2-6 hours, 1x/day, 3-4 days/week Prosthetic weight bearing tolerance: 3 minutes with limb pain Edema: pitting edema Residual limb condition: No open areas, dry skin, normal color and temperature, cylindrical shape Prosthetic description: Silicone liner with pin lock suspension, total contact socket with flexible inner socket, sach foot    TODAY'S TREATMENT:                                                                                                             DATE: 11/23/2023    Prosthetic Training with Transtibial Prosthesis:  -Sit to stand with minA 3 reps with cues on technique.  -Standing 3 minutes with rolling walker support with supervision.  Performing cervical AROM.  - PT instructed husband in how to safely guard with his position and change sides that he is guarding if needed to help patient position herself to sit.   -PT demo and verbal cues on position of rolling walker and patient to decrease the number steps it takes to turn to position to sit. - Patient ambulated 27' turning 90* to chair on her right initiall with husband assisting and PT supervising. -PT reviewed with demo & verbal cues on proper sit down technique for safety.  Pt able to return demo following education.   Therapeutic Exercise: -PT reviewed cervical & lumbar AROM flexion, extension, rotation & side bend with deep breath at end range and looking where she is moving. Pt  performed cervical 5 reps ea motion standing with RW support and 3 reps ea motion seated without back support.  Lumbar seated 3 reps ea motion.  Pt & husband verbalize understanding.     TREATMENT:                                                                                                             DATE: 11/18/2023    Prosthetic Training with Transtibial Prosthesis:  -Sit to stand with minA 3 reps  with cues on technique.  -Standing 3 minutes with rolling walker support with supervision.  PT recommended patient to stand before walking if she has been sitting more than 10 to 15 minutes just to stretch to upright posture.  Patient and husband verbalized understanding - PT instructed husband in how to safely guard with his position and change sides that he is guarding if needed to help patient position herself to sit.   -PT demo and verbal cues on position of rolling walker and patient to decrease the number steps it takes to turn to position to sit. - Patient ambulated 23' turning 90* to chair on her right & 10' turning 90*to chair on her left with husband assisting and PT supervising. -PT educated patient and husband in how to address instability during gait.  First try to stop and stand up straight to reestablish his balance.  If she is buckling and going to the floor and unable to stand up, then her husband should use the gait belt to lower her under control to the floor so that she does not get hurt.  They could then call the local fire department to assist with getting her off the floor as long as she was not injured.  Both verbalized understanding  Self-care: - PT recommended towel roll in lumbar region which patient reports improved back discomfort.  PT and husband discussed how to affixes to the wheelchair so it stayed in place at the proper height even when she stands up.  PT also recommended adjusting leg support so that her femur is parallel to the floor which improved comfort.  Patient  had improved sitting posture with plywood under cushion to decrease [place] on the wheelchair.    TREATMENT:                                                                                                             DATE: 11/16/2023   Prosthetic Training with Transtibial Prosthesis:  -Sit to stand with minA 3 reps with PT cues on technique.  -Standing 3 minutes with rolling walker support with supervision with patient performing AROM for cervical flexion/extension, rotation both directions and side in both directions.   -Pt amb 22' x 2 with 90*to position in front of a target chair for sitting with RW with minA but constant verbal cues on pelvic weight shift to stance LE.  Patient's husband stood near her wheelchair so he could bring it in short time if needed but not falling directly behind patient to develop more security/confidence for gait without the chair right behind her.    TREATMENT:  DATE: 11/11/2023   Prosthetic Training with Transtibial Prosthesis:  -PT demo & verbal cues on technique / not locking knees out too soon. Sit to stand with minA 3 reps with PT cues on technique. -Pt amb 23' with RW with minA but constant verbal cues on pelvic weight shift to stance LE.  Pt's husband following with w/c so straight path for gait.   -PT demo & verbal cues on set up with w/c & BSC 10' apart.  If she begins to collapse then use belt to lower under control to floor.  Then call local fire dept for helping getting up. Pt & husband verbalized understanding.    Therapeutic Exercise: -PT reviewed cervical & lumbar AROM flexion, extension, rotation & side bend with deep breath at end range and looking where she is moving.  PT emphasized importance for continuing exercises or pain & mobility can get worse. Pt & husband verbalized understanding.     PATIENT EDUCATION: PATIENT EDUCATED ON  FOLLOWING PROSTHETIC CARE: Education details: Use of stockinette under proximal liner to decrease itching sensation, no wounds presents to PT recommended not using Vive wear under liner Skin check, Prosthetic cleaning, Propper donning, and Proper wear schedule/adjustment Prosthetic wear tolerance: 3 hours 2x/day, 4 days/week Person educated: Patient and Spouse Education method: Explanation, Tactile cues, and Verbal cues Education comprehension: verbalized understanding, verbal cues required, tactile cues required, and needs further education  HOME EXERCISE PROGRAM: Access Code: [email or web address] URL: [email or web address] Date: 09/23/2023 Prepared by: Grayce Spatz  Exercises - Supine Bridge  - 1 x daily - 4 x weekly - 2 sets - 5 reps - 2 seconds hold - Supine Lower Trunk Rotation  - 1 x daily - 4 x weekly - 2 sets - 5 reps - 5 seconds hold - Supine Heel Slide with Strap  - 1 x daily - 4 x weekly - 2 sets - 5 reps - 2-3 seconds hold - Supine Hip Abduction  - 1 x daily - 4 x weekly - 2 sets - 5 reps - 2-3 seconds hold - Seated Hip Flexion Toward Target  - 1 x daily - 4 x weekly - 2 sets - 5 reps - 5 seconds hold - Seated Eccentric Abdominal Lean Back  - 1 x daily - 4 x weekly - 2 sets - 5 reps - 5 seconds hold - Seated Sidebending  - 1 x daily - 4 x weekly - 2 sets - 5 reps - 5 seconds hold - Seated Trunk Rotation with Crossed Arms  - 1 x daily - 4 x weekly - 2 sets - 5 reps - 5 seconds hold - Seated March  - 1 x daily - 4 x weekly - 2 sets - 10 reps - 5 seconds hold - Seated Knee Flexion Extension AROM   - 1 x daily - 4 x weekly - 2 sets - 10 reps - 5 seconds hold    ASSESSMENT:  CLINICAL IMPRESSION: patient appears to be safe walking with husband short distances.  She was challenged with balance with standing cervical AROM but was safe with supervision.    OBJECTIVE IMPAIRMENTS: Abnormal gait, decreased activity tolerance, decreased balance, decreased endurance, decreased knowledge  of condition, decreased knowledge of use of DME, decreased mobility, difficulty walking, decreased ROM, decreased strength, decreased safety awareness, postural dysfunction, prosthetic dependency , and pain.   ACTIVITY LIMITATIONS: standing, transfers, and locomotion level  PARTICIPATION LIMITATIONS: community activity, household mobility and dependency / burden of care on family  PERSONAL FACTORS:  Age, Fitness, Past/current experiences, Time since onset of injury/illness/exacerbation, and 3+ comorbidities: see PMH are also affecting patient's functional outcome.   REHAB POTENTIAL: Good  CLINICAL DECISION MAKING: Evolving/moderate complexity  EVALUATION COMPLEXITY: Moderate   GOALS: Goals reviewed with patient? Yes  SHORT TERM GOALS: Target date: 11/12/2023:  Patient & husband verbalized patient able to perform scooting transfer to / from [facility] and sit to/from stand [facility] to RW with husband's assistance safely. Baseline: SEE OBJECTIVE DATA Goal status:   MET  10/14/2023 2.  Patient able to sit to stand w/c to rolling walker with min assistance. Baseline: SEE OBJECTIVE DATA Goal status: partially MET (not consistently)  11/09/2023  3. Patient ambulates 36ft including turning 90* to position to sit with RW & prosthesis with modA.  Baseline: MET  11/16/2023  UPDATED LONG TERM GOALS: Target date: 12/03/2023  Patient & husband continues to demonstrate & verbalize understanding of prosthetic care and wears prosthesis >80% of awake hours daily to enable safe utilization of prosthesis. Baseline: SEE OBJECTIVE DATA Goal status: Ongoing    11/23/2023  Patient and husband report patient is able to stand to enable cleaning after toileting safely with no issues Baseline: SEE OBJECTIVE DATA Goal status: Ongoing   11/23/2023  Patient able to perform sit to/from stand transfers with minA.  Baseline: SEE OBJECTIVE DATA Goal status: Ongoing    11/23/2023  Patient ambulates 81ft including turning 90* to  position to sit with prosthesis & RW with minA with husband's assistance safely. Baseline: SEE OBJECTIVE DATA Goal status: ongoing   11/23/2023  Patient understanding of ongoing HEP. Baseline: SEE OBJECTIVE DATA Goal status:  Ongoing  11/23/2023  PLAN:  PT FREQUENCY: 2x/week  PT DURATION: 12 weeks  PLANNED INTERVENTIONS: 97164- PT Re-evaluation, 97110-Therapeutic exercises, 97530- Therapeutic activity, 97112- Neuromuscular re-education, [id number]- Self Care, [id number]- Gait training, [id number]- Prosthetic training, Patient/Family education, Balance training, DME instructions, Therapeutic exercises, Therapeutic activity, Neuromuscular re-education, Gait training, and Self Care  PLAN FOR NEXT SESSION:   check how appt with prosthetist went,   Work towards LTG's.  Continue to encourage daily use of BSC.  Continue with standing. Gait activities walking between 2 chairs initiating husband assisting with PT supervising.    Makenley Shimp, PT, DPT 11/23/2023, 3:16 PM

## 2023-11-23 NOTE — Telephone Encounter (Signed)
 Requested medications are due for refill today.  no  Requested medications are on the active medications list.  yes  Last refill. 11/19/2023 #60 1 rf  Future visit scheduled.   no  Notes to clinic.  Refill not delegated.    Requested Prescriptions  Pending Prescriptions Disp Refills   clonazePAM  (KLONOPIN ) 0.5 MG disintegrating tablet 60 tablet 1    Sig: Take 1 tablet (0.5 mg total) by mouth 2 (two) times daily. Stop lorazepam .     Not Delegated - Psychiatry: Anxiolytics/Hypnotics 2 Failed - 11/23/2023  2:27 PM      Failed - This refill cannot be delegated      Failed - Urine Drug Screen completed in last 360 days      Failed - Valid encounter within last 6 months    Recent Outpatient Visits           4 weeks ago Strain of neck muscle, initial encounter   [place] [facility] Duanne Butler DASEN, MD   2 months ago ESRF (end stage renal failure) [facility])   [place] [facility] Duanne Butler DASEN, MD   4 months ago Pneumonia of right lower lobe due to infectious organism   [place] [facility] Duanne Butler DASEN, MD   5 months ago Acute bronchitis, unspecified organism    [facility] Aletha Bene, MD   8 months ago Uncontrolled type 2 diabetes mellitus with hyperglycemia [facility])   [place] [facility] [name], Butler DASEN, MD       Future Appointments             In 2 months Branch, Dorn FALCON, MD [facility] [place] [facility]            Passed - Patient is not pregnant

## 2023-11-24 DIAGNOSIS — D689 Coagulation defect, unspecified: Secondary | ICD-10-CM | POA: Diagnosis not present

## 2023-11-24 DIAGNOSIS — D509 Iron deficiency anemia, unspecified: Secondary | ICD-10-CM | POA: Diagnosis not present

## 2023-11-24 DIAGNOSIS — N186 End stage renal disease: Secondary | ICD-10-CM | POA: Diagnosis not present

## 2023-11-24 DIAGNOSIS — N2581 Secondary hyperparathyroidism of renal origin: Secondary | ICD-10-CM | POA: Diagnosis not present

## 2023-11-24 DIAGNOSIS — Z992 Dependence on renal dialysis: Secondary | ICD-10-CM | POA: Diagnosis not present

## 2023-11-24 NOTE — Therapy (Incomplete)
 OUTPATIENT PHYSICAL THERAPY PROSTHETIC TREATMENT  Patient Name: Susan [name] MRN: [id number] DOB:26-Aug-1950, 73 y.o., female Today's Date: 11/25/2023  PCP: Duanne Butler DASEN, MD  REFERRING PROVIDER:  Jerona Sage, MD  END OF SESSION:  PT End of Session - 11/25/23 1347     Visit Number 53    Number of Visits 60    Date for PT Re-Evaluation 12/03/23    Authorization Type [employer]    PT Start Time 1346    PT Stop Time 1428    PT Time Calculation (min) 42 min    Equipment Utilized During Treatment Gait belt    Activity Tolerance Patient tolerated treatment well;Patient limited by fatigue    Behavior During Therapy WFL for tasks assessed/performed          Past Medical History:  Diagnosis Date   Anemia    hx   Anxiety    Arthritis    generalized (03/15/2014)   CAD (coronary artery disease)    MI in 2000 - MI  2007 - treated bare metal stent (no nuclear since then as 9/11)   Carotid artery disease (HCC)    Chronic diastolic heart failure (HCC)    a) ECHO (08/2013) EF 55-60% and RV function nl b) RHC (08/2013) RA 4, RV 30/5/7, PA 25/10 (16), PCWP 7, Fick CO/CI 6.3/2.7, PVR 1.5 WU, PA 61 and 66%   Daily headache    ~ every other day; since I fell in June (03/15/2014)   Depression    Diabetic retinopathy (HCC)    Dyslipidemia    ESRD (end stage renal disease) (HCC)    Dialysis on Tues Thurs Sat   Exertional shortness of breath    GERD (gastroesophageal reflux disease)    History of blood transfusion    History of kidney stones    HTN (hypertension)    Hypothyroidism    Myocardial infarction (HCC)    Obesity    Osteoarthritis    PAF (paroxysmal atrial fibrillation) (HCC)    Peripheral neuropathy    bilateral feet/hands   PONV (postoperative nausea and vomiting)    RBBB (right bundle branch block)    Old   Stroke (HCC)    mini strokes   Type II diabetes mellitus (HCC)    Type II, Barnet libre left upper arm. patient has omnipod insulin  pump  with Novolin R Insulin    Past Surgical History:  Procedure Laterality Date   A/V FISTULAGRAM Left 11/07/2022   Procedure: A/V Fistulagram;  Surgeon: Magda Debby SAILOR, MD;  [facility]: [facility];  Service: Cardiovascular;  Laterality: Left;   ABDOMINAL HYSTERECTOMY  1980's   AMPUTATION Right 02/24/2018   Procedure: RIGHT FOOT GREAT TOE AND 2ND TOE AMPUTATION;  Surgeon: Sage Jerona GAILS, MD;  Location: [facility] OR;  Service: Orthopedics;  Laterality: Right;   AMPUTATION Right 04/30/2018   Procedure: RIGHT TRANSMETATARSAL AMPUTATION;  Surgeon: Sage Jerona GAILS, MD;  Location: [facility] OR;  Service: Orthopedics;  Laterality: Right;   AMPUTATION Right 05/02/2022   Procedure: RIGHT BELOW KNEE AMPUTATION;  Surgeon: Sage Jerona GAILS, MD;  Location: [facility] OR;  Service: Orthopedics;  Laterality: Right;   APPLICATION OF WOUND VAC Right 06/13/2022   Procedure: APPLICATION OF WOUND VAC;  Surgeon: Sage Jerona GAILS, MD;  [facility]: [facility] OR;  Service: Orthopedics;  Laterality: Right;   APPLICATION OF WOUND VAC Left 11/14/2022   Procedure: APPLICATION OF WOUND VAC;  Surgeon: Sage Jerona GAILS, MD;  [facility]: [facility] OR;  Service: Orthopedics;  Laterality: Left;   AV FISTULA PLACEMENT Left 04/02/2022   Procedure: LEFT ARM ARTERIOVENOUS (AV) FISTULA CREATION;  Surgeon: Serene Gaile ORN, MD;  [facility]: [facility];  Service: Vascular;  Laterality: Left;  PERIPHERAL NERVE BLOCK   AV FISTULA PLACEMENT Right 04/27/2023   Procedure: RIGHT ARM BRACHIOBASILIC ARTERIOVENOUS (AV) FISTULA CREATION;  Surgeon: Magda Debby SAILOR, MD;  [facility]: [facility];  Service: Vascular;  Laterality: Right;   BASCILIC VEIN TRANSPOSITION Left 07/31/2022   Procedure: LEFT ARM SECOND STAGE BASILIC VEIN TRANSPOSITION;  Surgeon: Serene Gaile ORN, MD;  [facility]: [facility] OR;  Service: Vascular;  Laterality: Left;   BIOPSY  05/27/2020   Procedure: BIOPSY;  Surgeon: Cindie Carlin POUR, DO;  [facility]: [facility];  Service: Endoscopy;;   CATARACT EXTRACTION, BILATERAL Bilateral ?2013   COLONOSCOPY  W/ POLYPECTOMY     COLONOSCOPY WITH PROPOFOL  N/A 03/13/2019   Procedure: COLONOSCOPY WITH PROPOFOL ;  Surgeon: Albertus Gordy HERO, MD;  Location: [facility] ENDOSCOPY;  Service: Gastroenterology;  Laterality: N/A;   CORONARY ANGIOPLASTY WITH STENT PLACEMENT  1999; 2007   1 + 1   ERCP N/A 02/03/2022   Procedure: ENDOSCOPIC RETROGRADE CHOLANGIOPANCREATOGRAPHY (ERCP);  Surgeon: Rollin Dover, MD;  Location: [facility] ENDOSCOPY;  Service: Gastroenterology;  Laterality: N/A;   ESOPHAGOGASTRODUODENOSCOPY N/A 02/12/2023   Procedure: ESOPHAGOGASTRODUODENOSCOPY (EGD);  Surgeon: Rollin Dover, MD;  Location: [facility] ENDOSCOPY;  Service: Gastroenterology;  Laterality: N/A;   ESOPHAGOGASTRODUODENOSCOPY (EGD) WITH PROPOFOL  N/A 03/13/2019   Procedure: ESOPHAGOGASTRODUODENOSCOPY (EGD) WITH PROPOFOL ;  Surgeon: Albertus Gordy HERO, MD;  Location: [facility] ENDOSCOPY;  Service: Gastroenterology;  Laterality: N/A;   ESOPHAGOGASTRODUODENOSCOPY (EGD) WITH PROPOFOL  N/A 05/27/2020   Procedure: ESOPHAGOGASTRODUODENOSCOPY (EGD) WITH PROPOFOL ;  Surgeon: Cindie Carlin POUR, DO;  [facility];  Service: Endoscopy;  Laterality: N/A;   ESOPHAGOGASTRODUODENOSCOPY (EGD) WITH PROPOFOL  N/A 09/03/2022   Procedure: ESOPHAGOGASTRODUODENOSCOPY (EGD) WITH PROPOFOL ;  Surgeon: Rollin Dover, MD;  Location: [facility] ENDOSCOPY;  Service: Gastroenterology;  Laterality: N/A;   EYE SURGERY Bilateral    lazer   FLEXIBLE SIGMOIDOSCOPY N/A 05/23/2022   Procedure: FLEXIBLE SIGMOIDOSCOPY;  Surgeon: Rollin Dover, MD;  Location: [facility] ENDOSCOPY;  Service: Gastroenterology;  Laterality: N/A;   FLEXIBLE SIGMOIDOSCOPY N/A 05/24/2022   Procedure: FLEXIBLE SIGMOIDOSCOPY;  Surgeon: Federico Rosario BROCKS, MD;  Location: [facility] ENDOSCOPY;  Service: Gastroenterology;  Laterality: N/A;   FLEXIBLE SIGMOIDOSCOPY N/A 09/03/2022   Procedure: FLEXIBLE SIGMOIDOSCOPY;  Surgeon: Rollin Dover, MD;  Location: [facility] ENDOSCOPY;  Service: Gastroenterology;  Laterality: N/A;   HEMOSTASIS CLIP PLACEMENT  03/13/2019    Procedure: HEMOSTASIS CLIP PLACEMENT;  Surgeon: Albertus Gordy HERO, MD;  Location: [facility] ENDOSCOPY;  Service: Gastroenterology;;   HEMOSTASIS CLIP PLACEMENT  05/23/2022   Procedure: HEMOSTASIS CLIP PLACEMENT;  Surgeon: Rollin Dover, MD;  Location: [facility] ENDOSCOPY;  Service: Gastroenterology;;   HEMOSTASIS CONTROL  05/24/2022   Procedure: HEMOSTASIS CONTROL;  Surgeon: Federico Rosario BROCKS, MD;  Location: [facility] ENDOSCOPY;  Service: Gastroenterology;;   HOT HEMOSTASIS N/A 05/23/2022   Procedure: HOT HEMOSTASIS (ARGON PLASMA COAGULATION/BICAP);  Surgeon: Rollin Dover, MD;  Location: [facility] ENDOSCOPY;  Service: Gastroenterology;  Laterality: N/A;   I & D EXTREMITY Left 05/05/2022   Procedure: IRRIGATION AND DEBRIDEMENT LEFT ARM AV FISTULA;  Surgeon: Gretta Lonni PARAS, MD;  Location: [facility] OR;  Service: Vascular;  Laterality: Left;   I & D EXTREMITY N/A 11/14/2022   Procedure: IRRIGATION AND DEBRIDEMENT OF LOWER EXTREMITY WOUND;  Surgeon: Harden Jerona GAILS, MD;  [facility] OR;  Service: Orthopedics;  Laterality: N/A;   INSERTION OF DIALYSIS CATHETER Right 04/02/2022  Procedure: INSERTION OF TUNNELED DIALYSIS CATHETER;  Surgeon: Serene Gaile ORN, MD;  Location: [facility] OR;  Service: Vascular;  Laterality: Right;   IR FLUORO GUIDE CV LINE RIGHT  08/10/2023   IR VENOCAVAGRAM IVC  08/10/2023   KNEE ARTHROSCOPY Left 10/25/2006   POLYPECTOMY  03/13/2019   Procedure: POLYPECTOMY;  Surgeon: Albertus Gordy HERO, MD;  Location: [facility] ENDOSCOPY;  Service: Gastroenterology;;   REMOVAL OF STONES  02/03/2022   Procedure: REMOVAL OF STONES;  Surgeon: Rollin Dover, MD;  Location: [facility] ENDOSCOPY;  Service: Gastroenterology;;   REVISON OF ARTERIOVENOUS FISTULA Left 08/20/2022   Procedure: REVISON OF LEFT ARM ARTERIOVENOUS FISTULA;  Surgeon: Serene Gaile ORN, MD;  [facility];  Service: Vascular;  Laterality: Left;   RIGHT HEART CATH N/A 07/24/2017   Procedure: RIGHT HEART CATH;  Surgeon: Cherrie Toribio SAUNDERS, MD;  [facility];  Service:  Cardiovascular;  Laterality: N/A;   RIGHT HEART CATHETERIZATION N/A 09/22/2013   Procedure: RIGHT HEART CATH;  Surgeon: Toribio SAUNDERS Cherrie, MD;  Location: [facility] CATH LAB;  Service: Cardiovascular;  Laterality: N/A;   SHOULDER ARTHROSCOPY WITH OPEN ROTATOR CUFF REPAIR Right 03/14/2014   Procedure: RIGHT SHOULDER ARTHROSCOPY WITH BICEPS RELEASE, OPEN SUBSCAPULA REPAIR, OPEN SUPRASPINATUS REPAIR.;  Surgeon: Cordella Glendia Hutchinson, MD;  Location: [facility] OR;  Service: Orthopedics;  Laterality: Right;   SPHINCTEROTOMY  02/03/2022   Procedure: SPHINCTEROTOMY;  Surgeon: Rollin Dover, MD;  Location: [facility] ENDOSCOPY;  Service: Gastroenterology;;   STUMP REVISION Right 06/13/2022   Procedure: REVISION RIGHT BELOW KNEE AMPUTATION;  Surgeon: Harden Jerona GAILS, MD;  Location: [facility] OR;  Service: Orthopedics;  Laterality: Right;   TEE WITHOUT CARDIOVERSION N/A 02/04/2022   Procedure: TRANSESOPHAGEAL ECHOCARDIOGRAM (TEE);  Surgeon: Cherrie Toribio SAUNDERS, MD;  Location: [facility] ENDOSCOPY;  Service: Cardiovascular;  Laterality: N/A;   THROMBECTOMY W/ EMBOLECTOMY Left 08/20/2022   Procedure: THROMBECTOMY OF LEFT ARM ARTERIOVENOUS FISTULA;  Surgeon: Serene Gaile ORN, MD;  Location: [facility] OR;  Service: Vascular;  Laterality: Left;   TOE AMPUTATION Right 02/24/2018   GREAT TOE AND 2ND TOE AMPUTATION   TUBAL LIGATION  1970's   UPPER EXTREMITY VENOGRAPHY N/A 07/31/2023   Procedure: UPPER EXTREMITY VENOGRAPHY;  Surgeon: Magda Debby SAILOR, MD;  [facility]: [facility];  Service: Cardiovascular;  Laterality: N/A;   Patient Active Problem List   Diagnosis Date Noted   UTI (urinary tract infection) 10/01/2023   Pyelonephritis 08/06/2023   Complicated UTI (urinary tract infection) 08/05/2023   Atrial fibrillation (HCC) 06/17/2023   Atopic dermatitis 06/17/2023   Hyperosmolar hyperglycemic state (HHS) (HCC) 02/05/2023   Gastroparesis 02/05/2023   Depression 01/29/2023   Intractable nausea and vomiting 01/20/2023   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 difficile colitis 06/05/2022   Spleen hematoma 06/05/2022   Dehiscence of amputation stump of right lower extremity (HCC) 06/05/2022   Rectal ulcer 05/27/2022   ESRD (end stage renal disease) (HCC) 05/27/2022   GI bleed 05/23/2022   Difficult intravenous access 05/23/2022   Gangrene of  right foot (HCC) 05/02/2022   S/P BKA (below knee amputation) unilateral, right (HCC) 05/02/2022   Unspecified protein-calorie malnutrition (HCC) 04/15/2022   Secondary hyperparathyroidism of renal origin (HCC) 04/14/2022   Coagulation defect, unspecified (HCC) 04/09/2022   Acquired absence of other left toe(s) (HCC) 04/07/2022   Allergy, unspecified, initial encounter 04/07/2022   Dependence on renal dialysis (HCC) 04/07/2022   Gout due to renal impairment, unspecified site 04/07/2022   Hypertensive heart and chronic kidney disease with heart failure and with stage 5 chronic kidney disease, or end stage renal disease (HCC) 04/07/2022   Personal history of transient ischemic attack (TIA), and cerebral infarction without residual deficits 04/07/2022   Renal osteodystrophy 04/07/2022   Venous stasis ulcer of right calf (HCC) 03/31/2022   Fistula, colovaginal 03/26/2022   Diarrhea 03/26/2022   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   Sepsis without acute organ dysfunction (HCC)    Bacteremia    Acute pancreatitis 02/01/2022   Abdominal pain  02/01/2022   SIRS (systemic inflammatory response syndrome) (HCC) 02/01/2022   Transaminitis 02/01/2022   History of anemia due to chronic kidney disease 02/01/2022   Paroxysmal atrial fibrillation (HCC) 02/01/2022   Uncontrolled type 2 diabetes mellitus with hyperglycemia, with long-term current use of insulin  (HCC) 01/14/2022   NSTEMI (non-ST elevated myocardial infarction) (HCC) 03/05/2021   Acute renal failure superimposed on stage 4 chronic kidney disease (HCC) 08/22/2020   Hypoalbuminemia 05/25/2020   GERD (gastroesophageal reflux disease) 05/25/2020   Pressure injury of skin 05/17/2020   Acute on chronic combined systolic and diastolic congestive heart failure (HCC) 03/07/2020   Type 2 diabetes mellitus with diabetic polyneuropathy, with long-term current use of insulin  (HCC) 03/07/2020   Obesity, Class III, BMI 40-49.9 (morbid obesity) 03/07/2020   Common bile duct (CBD) obstruction 05/28/2019   Benign neoplasm of ascending colon    Benign neoplasm of transverse colon    Benign neoplasm of descending colon    Benign neoplasm of sigmoid colon    Gastric polyps    Hyperkalemia 03/11/2019   Prolonged QT interval 03/11/2019   Acute blood loss anemia 03/11/2019   Onychomycosis 06/21/2018   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)    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 (HCC)    PVD (peripheral vascular disease) (HCC) 10/26/2017   E-coli UTI 07/27/2017   Hypothyroidism 07/27/2017   AKI (acute kidney injury) (HCC)    PAH (pulmonary artery hypertension) (HCC)    Impaired ambulation 07/19/2017   Leg cramps 02/27/2017   Peripheral edema 01/12/2017   Diabetic neuropathy (HCC) 11/12/2016   Anemia of chronic disease 10/03/2015   Historical diagnosis of generalized anxiety disorder 10/03/2015   Secondary insomnia 10/03/2015   Acute bronchitis 09/05/2015   Hyperglycemia due to diabetes mellitus (HCC) 06/07/2015   Non compliance with medical treatment  04/17/2014   Rotator cuff tear 03/14/2014   Obesity 09/23/2013   Chronic HFrEF (heart failure with reduced ejection fraction) (HCC) 06/03/2013   Hypotension 12/25/2012   Hypokalemia 12/25/2012   Hyponatremia 12/25/2012   Urinary incontinence    MDD (major depressive disorder), recurrent episode, moderate (HCC) 11/12/2010   RBBB (right bundle branch block)    Wide-complex tachycardia    Coronary artery disease    Hyperlipemia 01/22/2009   Chronic hypotension 01/22/2009    ONSET DATE: 03/09/2023 MD referral to PT  REFERRING DIAG: S88.111D (ICD-10-CM) - Below-knee amputation of right lower extremity, subsequent encounter   THERAPY DIAG:  Muscle weakness (generalized)  Other abnormalities of gait  and mobility  Unsteadiness on feet  Impaired functional mobility, balance, gait, and endurance  Rationale for Evaluation and Treatment: Rehabilitation  SUBJECTIVE:  SUBJECTIVE STATEMENT: Patient reports she has been doing well since last PT appointment when no new changes. She does her HEP twice a day.  Mentions medications such as ibuprofen and tylenol  help with pain. No near falls She saw prosthetist today for initial casting for socket revision.    PERTINENT HISTORY: Right TTA 05/02/22, Lymphedema, idiopathic chronic venous HTN with ulcer, depression, colitis, diverticulitis, ESRD, gout, NSTEMI, DM2, polyneuropathy, PVD, CAD, right bundle branch block, mini strokes  PAIN:  Are you having pain? Yes Back pain today  7/10 at rest and during activity  Neck pain left side 5/10 & right side 7/10 Right shoulder 7/10  PRECAUTIONS: Fall and Other: No BP LUE  WEIGHT BEARING RESTRICTIONS: No  FALLS: Has patient fallen in last 6 months? No  LIVING ENVIRONMENT: Lives with: lives with their spouse and 2 small dogs Lives in: mobile home Home Access: Ramped entrance Home layout: One level Stairs: Yes: External: 6-7 steps; can reach both Has following equipment at home: Single point cane,  Environmental consultant - 2 wheeled, Environmental consultant - 4 wheeled, Wheelchair (manual), [employer], Grab bars, and Ramped entry  OCCUPATION:  retired  PLOF: prior to amputation for ~year used RW in home & community  PATIENT GOALS:  to use prosthesis to walk, get out w/c in & out of car, get to bathroom.   OBJECTIVE:  COGNITION: Overall cognitive status:  Eval on 03/23/2023:   Within functional limits for tasks assessed  POSTURE:  Eval on 03/23/2023:  rounded shoulders, forward head, increased thoracic kyphosis, flexed trunk , and weight shift left  LOWER EXTREMITY ROM: ROM Right eval Left eval  Hip flexion    Hip extension    Hip abduction    Hip adduction    Hip internal rotation    Hip external rotation    Knee flexion    Knee extension    Ankle dorsiflexion    Ankle plantarflexion    Ankle inversion    Ankle eversion     (Blank rows = not tested)  LOWER EXTREMITY MMT:  MMT Right eval Left eval Right 09/09/23 Left 09/09/23  Hip flexion 3-/5 3-/5 3/5 3/5  Hip extension 2/5 2/5 3-/5 3-/5  Hip abduction 2+/5 2+/5 3-/5 3-/5  Hip adduction      Hip internal rotation      Hip external rotation      Knee flexion 3-/5 3-/5 3/5 3/5  Knee extension 3-/5 3-/5 3/5 3/5  Ankle dorsiflexion      Ankle plantarflexion      Ankle inversion      Ankle eversion      At Evaluation all strength testing is grossly seated and functionally standing / gait. (Blank rows = not tested)  TRANSFERS: 09/07/2023: Scooting transfers with minA and sit to/from stand transfers with modA.   07/27/2023: -Sit>stand from wc with RW: MinA with heavy vc for sequencing and offweighting LE -Stand>sit wc with RW: CGA with patient performing safe controlled descent  06/15/2023: -Sit to stand transfers with RW wheelchair<>mat table at varied height: 19in with maxA to rise to stand; 22in ModA rise to stand, 24in min-modA rise to stand. -stand pivot transfer with RW w/c to mat table with modA once standing.  -Lateral scooting  wc<>table at level height SBA; vc for LE positioning for optimal use of LE during transfer -lateral scoot R: modA  due to fatigue and uneven surface transfer from low surface to higher surface   05/04/2023: Squat pivot transfers with modA.  04/21/2023: Pt sit to stand w/c to //bar with one hand pushing on w/c and other hand on //bar with modA first time & MinA 2nd/ 3rd time.  Best is RUE on //bar & LUE on w/c.  Stand to sit reaching LUE to w/c with minA to control descent.   PT demo & verbal cues on sit to/from stand w/c to RW.  1st time modA 2 person. 2nd time maxA one person.  Stand to sit with minA and constant cueing. Scooting transfers with minA with w/c & mat direct contact & same ht.   Eval on 03/23/2023:  Sit to stand: Max A (two-person assist) from 22 w/c to rolling walker Stand to sit: Max A (two-person assist) rolling walker to wheelchair  FUNCTIONAL TESTs:   09/09/2023: Patient able to tolerate static stance for 5 minutes with BUE support on RW with supervision.  This was current certification goal set so that patient can stand long enough for her husband to assist with cleaning her after toileting.  07/27/2023: Patient stood with RW SBA for 54sec before c/o lightheadedness and sitting down to toilet. Resisted nudges applied posteriorly to simulate hygienic cleaning. VC for weight shifts when LE become tired.  BP: after standing 65min54sec 161/82 HR 92  06/15/2023: -Static stance for standing with RW; requiring Mod-MaxA for rise to stand d/t patient unsteady on feet posterior lean without proper righting strategy, SBA for stand   04/21/2023: Pt able to stand 60 sec with BUE on //bars with supervision.  Pt able to stand for 1 min with RW support with minA.  Eval on 03/23/2023: Patient maintained upright holding rolling walker with mod assist 2 people for safety for 30 seconds.  GAIT: 09/09/2023: Patient ambulated 30' with RW including turning 90*to position to sit  with mod assistance  07/27/2023: Patient ambulated 43ft with narrow bathroom entry and 90deg turn to sit on commode CGA with minA and heavy vc for progression and sequencing of RW. While turning to sit on commode patient lost balance laterally requiring mod-maxA from therapist and patient use of grab bar to regain balance.   06/17/2023: Patient ambulated 42ft (maximal tolerated distance) with RW ModA (2 person for safety). Required VC for LE sequencing and minA to steer walker and prevent tip over.   04/21/2023: Pt amb 5' in //bars with modA (2 person for safety) with PT verbal & tactile cues on sequence and weight shift.  Pt amb 10' with RW with maxA (2 person for safety) with PT verbal & tactile cues on sequence and weight shift.  Eval on 03/23/2023:  Patient took 2 steps (one per LE) with +2 max assist with rolling walker and TTA prosthesis.  Patient adducting prosthesis stepping on left foot with no awareness.  CURRENT PROSTHETIC WEAR ASSESSMENT: 09/07/2023: Patient and husband are independent with: skin check, prosthetic cleaning, ply sock cleaning, proper wear schedule/adjustment, residual limb care, correct ply sock adjustment, and proper weight-bearing schedule/adjustment Pt reports wear most of awake hours on dialysis and non-dialysis days.  She wears prosthesis to dialysis now to assist with transfers prn and is able to verbalize proper adjustment of prosthesis weight for weigh-in & weigh-out.   06/17/2023:  Independence with prosthetic care:  Patient and husband are independent with: skin check, prosthetic cleaning, ply sock cleaning, proper wear schedule/adjustment Patient is dependent with: residual limb care, correct  ply sock adjustment, and proper weight-bearing schedule/adjustment Donning prosthesis: husband is independent with proper donning. Patient requires MaxA. Patient can verbally direct someone how to properly assist. Doffing prosthesis: Husband is independent with proper  donning. Patient requires modA. Patient can verbally direct someone how to properly assist. Prosthetic wear tolerance: majority of awake hours including dialysis hours, 1x/day, 3-4 days/week Prosthetic weight bearing tolerance: able to tolerate of standing without complains of prosthetic limb pain. Time not limited by limb discomfort but limited by muscle fatigue.   Eval on 03/23/2023:   Patient is dependent with: skin check, residual limb care, prosthetic cleaning, ply sock cleaning, correct ply sock adjustment, proper wear schedule/adjustment, and proper weight-bearing schedule/adjustment Donning prosthesis: Max A Doffing prosthesis: Min A Prosthetic wear tolerance: 2-6 hours, 1x/day, 3-4 days/week Prosthetic weight bearing tolerance: 3 minutes with limb pain Edema: pitting edema Residual limb condition: No open areas, dry skin, normal color and temperature, cylindrical shape Prosthetic description: Silicone liner with pin lock suspension, total contact socket with flexible inner socket, sach foot  TODAY'S TREATMENT:                                                                                                             DATE: 11/25/2023   Prosthetic Training with Transtibial Prosthesis:  -Sit to stand with minA 3 reps with cues on technique.  - Standing 3 minutes with rolling walker support with supervision. Performing cervical AROM. - PT educated about proper technique for STS with rolling walker. Patient verbalized understanding.  - Patient attempted to ambulate 25 ft turning 90 degree to chair on her right. Pt was not able to turn 90 degrees to sit into chair due to fatigue and unsteadiness. Husband assisted with SPT supervision.  - Patient ambulated 44ft turning 90 degrees to chair on her right. Husband assisted with SPT supervision.    TODAY'S TREATMENT:                                                                                                             DATE: 11/23/2023     Prosthetic Training with Transtibial Prosthesis:  -Sit to stand with minA 3 reps with cues on technique.  -Standing 3 minutes with rolling walker support with supervision.  Performing cervical AROM.  - PT instructed husband in how to safely guard with his position and change sides that he is guarding if needed to help patient position herself to sit.   -PT demo and verbal cues on position of rolling walker and patient to decrease the number steps it takes to turn to position to  sit. - Patient ambulated 27' turning 90* to chair on her right initiall with husband assisting and PT supervising. -PT reviewed with demo & verbal cues on proper sit down technique for safety.  Pt able to return demo following education.   Therapeutic Exercise: -PT reviewed cervical & lumbar AROM flexion, extension, rotation & side bend with deep breath at end range and looking where she is moving. Pt performed cervical 5 reps ea motion standing with RW support and 3 reps ea motion seated without back support.  Lumbar seated 3 reps ea motion.  Pt & husband verbalize understanding.     TREATMENT:                                                                                                             DATE: 11/18/2023    Prosthetic Training with Transtibial Prosthesis:  -Sit to stand with minA 3 reps with cues on technique.  -Standing 3 minutes with rolling walker support with supervision.  PT recommended patient to stand before walking if she has been sitting more than 10 to 15 minutes just to stretch to upright posture.  Patient and husband verbalized understanding - PT instructed husband in how to safely guard with his position and change sides that he is guarding if needed to help patient position herself to sit.   -PT demo and verbal cues on position of rolling walker and patient to decrease the number steps it takes to turn to position to sit. - Patient ambulated 75' turning 90* to chair on her right & 10' turning  90*to chair on her left with husband assisting and PT supervising. -PT educated patient and husband in how to address instability during gait.  First try to stop and stand up straight to reestablish his balance.  If she is buckling and going to the floor and unable to stand up, then her husband should use the gait belt to lower her under control to the floor so that she does not get hurt.  They could then call the local fire department to assist with getting her off the floor as long as she was not injured.  Both verbalized understanding  Self-care: - PT recommended towel roll in lumbar region which patient reports improved back discomfort.  PT and husband discussed how to affixes to the wheelchair so it stayed in place at the proper height even when she stands up.  PT also recommended adjusting leg support so that her femur is parallel to the floor which improved comfort.  Patient had improved sitting posture with plywood under cushion to decrease [place] on the wheelchair.     PATIENT EDUCATION: PATIENT EDUCATED ON FOLLOWING PROSTHETIC CARE: Education details: Use of stockinette under proximal liner to decrease itching sensation, no wounds presents to PT recommended not using Vive wear under liner Skin check, Prosthetic cleaning, Propper donning, and Proper wear schedule/adjustment Prosthetic wear tolerance: 3 hours 2x/day, 4 days/week Person educated: Patient and Spouse Education method: Explanation, Tactile cues, and Verbal cues Education comprehension: verbalized understanding, verbal cues required,  tactile cues required, and needs further education  HOME EXERCISE PROGRAM: Access Code: [email or web address] URL: [email or web address] Date: 09/23/2023 Prepared by: Grayce Spatz  Exercises - Supine Bridge  - 1 x daily - 4 x weekly - 2 sets - 5 reps - 2 seconds hold - Supine Lower Trunk Rotation  - 1 x daily - 4 x weekly - 2 sets - 5 reps - 5 seconds hold - Supine Heel Slide with Strap   - 1 x daily - 4 x weekly - 2 sets - 5 reps - 2-3 seconds hold - Supine Hip Abduction  - 1 x daily - 4 x weekly - 2 sets - 5 reps - 2-3 seconds hold - Seated Hip Flexion Toward Target  - 1 x daily - 4 x weekly - 2 sets - 5 reps - 5 seconds hold - Seated Eccentric Abdominal Lean Back  - 1 x daily - 4 x weekly - 2 sets - 5 reps - 5 seconds hold - Seated Sidebending  - 1 x daily - 4 x weekly - 2 sets - 5 reps - 5 seconds hold - Seated Trunk Rotation with Crossed Arms  - 1 x daily - 4 x weekly - 2 sets - 5 reps - 5 seconds hold - Seated March  - 1 x daily - 4 x weekly - 2 sets - 10 reps - 5 seconds hold - Seated Knee Flexion Extension AROM   - 1 x daily - 4 x weekly - 2 sets - 10 reps - 5 seconds hold    ASSESSMENT:  CLINICAL IMPRESSION: Patient appears to be safe walking with husband short distances. For longer distances, patient fatigues and becomes unsteady requiring to sit. Her balance was challenged during standing cervical AROM but was safe with supervision. She would continue to benefit from skilled PT to address balance, endurance, and gait deficits.   OBJECTIVE IMPAIRMENTS: Abnormal gait, decreased activity tolerance, decreased balance, decreased endurance, decreased knowledge of condition, decreased knowledge of use of DME, decreased mobility, difficulty walking, decreased ROM, decreased strength, decreased safety awareness, postural dysfunction, prosthetic dependency , and pain.   ACTIVITY LIMITATIONS: standing, transfers, and locomotion level  PARTICIPATION LIMITATIONS: community activity, household mobility and dependency / burden of care on family  PERSONAL FACTORS: Age, Fitness, Past/current experiences, Time since onset of injury/illness/exacerbation, and 3+ comorbidities: see PMH are also affecting patient's functional outcome.   REHAB POTENTIAL: Good  CLINICAL DECISION MAKING: Evolving/moderate complexity  EVALUATION COMPLEXITY: Moderate   GOALS: Goals reviewed with  patient? Yes  SHORT TERM GOALS: Target date: 11/12/2023:  Patient & husband verbalized patient able to perform scooting transfer to / from [facility] and sit to/from stand [facility] to RW with husband's assistance safely. Baseline: SEE OBJECTIVE DATA Goal status:   MET  10/14/2023 2.  Patient able to sit to stand w/c to rolling walker with min assistance. Baseline: SEE OBJECTIVE DATA Goal status: partially MET (not consistently)  11/09/2023  3. Patient ambulates 61ft including turning 90* to position to sit with RW & prosthesis with modA.  Baseline: MET  11/16/2023  UPDATED LONG TERM GOALS: Target date: 12/03/2023  Patient & husband continues to demonstrate & verbalize understanding of prosthetic care and wears prosthesis >80% of awake hours daily to enable safe utilization of prosthesis. Baseline: SEE OBJECTIVE DATA Goal status: Ongoing    11/23/2023  Patient and husband report patient is able to stand to enable cleaning after toileting safely with no issues Baseline: SEE OBJECTIVE DATA Goal status: Ongoing  11/23/2023  Patient able to perform sit to/from stand transfers with minA.  Baseline: SEE OBJECTIVE DATA Goal status: Ongoing    11/23/2023  Patient ambulates 68ft including turning 90* to position to sit with prosthesis & RW with minA with husband's assistance safely. Baseline: SEE OBJECTIVE DATA Goal status: ongoing   11/23/2023  Patient understanding of ongoing HEP. Baseline: SEE OBJECTIVE DATA Goal status:  Ongoing  11/23/2023  PLAN:  PT FREQUENCY: 2x/week  PT DURATION: 12 weeks  PLANNED INTERVENTIONS: 97164- PT Re-evaluation, 97110-Therapeutic exercises, 97530- Therapeutic activity, 97112- Neuromuscular re-education, [phone]- Self Care, [id number]- Gait training, [phone]- Prosthetic training, Patient/Family education, Balance training, DME instructions, Therapeutic exercises, Therapeutic activity, Neuromuscular re-education, Gait training, and Self Care  PLAN FOR NEXT SESSION:  begin assessing  LTGs with planned discharge next week.  Continue with standing, ambulating, and transfer activities. Continue to provide husband with the tools needed to help with functional demands for patient at home.    Ismael Nap, Student-PT, DPT 11/25/2023, 2:57 PM  This entire session of physical therapy was performed under the direct supervision of PT signing evaluation /treatment. PT reviewed note and agrees.   Grayce Spatz, PT, DPT 11/25/2023, 5:23 PM

## 2023-11-25 ENCOUNTER — Ambulatory Visit: Admitting: Physical Therapy

## 2023-11-25 ENCOUNTER — Encounter: Payer: Self-pay | Admitting: Physical Therapy

## 2023-11-25 DIAGNOSIS — R2689 Other abnormalities of gait and mobility: Secondary | ICD-10-CM

## 2023-11-25 DIAGNOSIS — R2681 Unsteadiness on feet: Secondary | ICD-10-CM

## 2023-11-25 DIAGNOSIS — Z7409 Other reduced mobility: Secondary | ICD-10-CM | POA: Diagnosis not present

## 2023-11-25 DIAGNOSIS — M6281 Muscle weakness (generalized): Secondary | ICD-10-CM

## 2023-11-30 ENCOUNTER — Ambulatory Visit: Admitting: Physical Therapy

## 2023-11-30 ENCOUNTER — Encounter: Payer: Self-pay | Admitting: Physical Therapy

## 2023-11-30 DIAGNOSIS — R2689 Other abnormalities of gait and mobility: Secondary | ICD-10-CM | POA: Diagnosis not present

## 2023-11-30 DIAGNOSIS — M6281 Muscle weakness (generalized): Secondary | ICD-10-CM

## 2023-11-30 DIAGNOSIS — Z7409 Other reduced mobility: Secondary | ICD-10-CM | POA: Diagnosis not present

## 2023-11-30 DIAGNOSIS — R2681 Unsteadiness on feet: Secondary | ICD-10-CM | POA: Diagnosis not present

## 2023-11-30 NOTE — Therapy (Addendum)
 OUTPATIENT PHYSICAL THERAPY PROSTHETIC TREATMENT  Patient Name: Susan [name] MRN: [id number] DOB:1951/04/05, 73 y.o., female Today's Date: 11/30/2023  PCP: Duanne Butler DASEN, MD  REFERRING PROVIDER:  Jerona Sage, MD  END OF SESSION:  PT End of Session - 11/30/23 1347     Visit Number 54    Number of Visits 60    Date for PT Re-Evaluation 12/03/23    Authorization Type [employer]    PT Start Time 1346    PT Stop Time 1426    PT Time Calculation (min) 40 min    Equipment Utilized During Treatment Gait belt    Activity Tolerance Patient tolerated treatment well;Patient limited by fatigue    Behavior During Therapy WFL for tasks assessed/performed           Past Medical History:  Diagnosis Date   Anemia    hx   Anxiety    Arthritis    generalized (03/15/2014)   CAD (coronary artery disease)    MI in 2000 - MI  2007 - treated bare metal stent (no nuclear since then as 9/11)   Carotid artery disease (HCC)    Chronic diastolic heart failure (HCC)    a) ECHO (08/2013) EF 55-60% and RV function nl b) RHC (08/2013) RA 4, RV 30/5/7, PA 25/10 (16), PCWP 7, Fick CO/CI 6.3/2.7, PVR 1.5 WU, PA 61 and 66%   Daily headache    ~ every other day; since I fell in June (03/15/2014)   Depression    Diabetic retinopathy (HCC)    Dyslipidemia    ESRD (end stage renal disease) (HCC)    Dialysis on Tues Thurs Sat   Exertional shortness of breath    GERD (gastroesophageal reflux disease)    History of blood transfusion    History of kidney stones    HTN (hypertension)    Hypothyroidism    Myocardial infarction (HCC)    Obesity    Osteoarthritis    PAF (paroxysmal atrial fibrillation) (HCC)    Peripheral neuropathy    bilateral feet/hands   PONV (postoperative nausea and vomiting)    RBBB (right bundle branch block)    Old   Stroke (HCC)    mini strokes   Type II diabetes mellitus (HCC)    Type II, Barnet libre left upper arm. patient has omnipod insulin  pump  with Novolin R Insulin    Past Surgical History:  Procedure Laterality Date   A/V FISTULAGRAM Left 11/07/2022   Procedure: A/V Fistulagram;  Surgeon: Magda Debby SAILOR, MD;  [facility]: [facility];  Service: Cardiovascular;  Laterality: Left;   ABDOMINAL HYSTERECTOMY  1980's   AMPUTATION Right 02/24/2018   Procedure: RIGHT FOOT GREAT TOE AND 2ND TOE AMPUTATION;  Surgeon: Sage Jerona GAILS, MD;  Location: [facility] OR;  Service: Orthopedics;  Laterality: Right;   AMPUTATION Right 04/30/2018   Procedure: RIGHT TRANSMETATARSAL AMPUTATION;  Surgeon: Sage Jerona GAILS, MD;  Location: [facility] OR;  Service: Orthopedics;  Laterality: Right;   AMPUTATION Right 05/02/2022   Procedure: RIGHT BELOW KNEE AMPUTATION;  Surgeon: Sage Jerona GAILS, MD;  Location: [facility] OR;  Service: Orthopedics;  Laterality: Right;   APPLICATION OF WOUND VAC Right 06/13/2022   Procedure: APPLICATION OF WOUND VAC;  Surgeon: Sage Jerona GAILS, MD;  [facility]: [facility] OR;  Service: Orthopedics;  Laterality: Right;   APPLICATION OF WOUND VAC Left 11/14/2022   Procedure: APPLICATION OF WOUND VAC;  Surgeon: Sage Jerona GAILS, MD;  [facility]: [facility] OR;  Service: Orthopedics;  Laterality: Left;   AV FISTULA PLACEMENT Left 04/02/2022   Procedure: LEFT ARM ARTERIOVENOUS (AV) FISTULA CREATION;  Surgeon: Serene Gaile ORN, MD;  Location: [facility];  Service: Vascular;  Laterality: Left;  PERIPHERAL NERVE BLOCK   AV FISTULA PLACEMENT Right 04/27/2023   Procedure: RIGHT ARM BRACHIOBASILIC ARTERIOVENOUS (AV) FISTULA CREATION;  Surgeon: Magda Debby SAILOR, MD;  [facility]: [facility];  Service: Vascular;  Laterality: Right;   BASCILIC VEIN TRANSPOSITION Left 07/31/2022   Procedure: LEFT ARM SECOND STAGE BASILIC VEIN TRANSPOSITION;  Surgeon: Serene Gaile ORN, MD;  [facility]: [facility];  Service: Vascular;  Laterality: Left;   BIOPSY  05/27/2020   Procedure: BIOPSY;  Surgeon: Cindie Carlin POUR, DO;  [facility]: [facility];  Service: Endoscopy;;   CATARACT EXTRACTION, BILATERAL Bilateral ?2013   COLONOSCOPY  W/ POLYPECTOMY     COLONOSCOPY WITH PROPOFOL  N/A 03/13/2019   Procedure: COLONOSCOPY WITH PROPOFOL ;  Surgeon: Albertus Gordy HERO, MD;  Location: [facility] ENDOSCOPY;  Service: Gastroenterology;  Laterality: N/A;   CORONARY ANGIOPLASTY WITH STENT PLACEMENT  1999; 2007   1 + 1   ERCP N/A 02/03/2022   Procedure: ENDOSCOPIC RETROGRADE CHOLANGIOPANCREATOGRAPHY (ERCP);  Surgeon: Rollin Dover, MD;  Location: [facility] ENDOSCOPY;  Service: Gastroenterology;  Laterality: N/A;   ESOPHAGOGASTRODUODENOSCOPY N/A 02/12/2023   Procedure: ESOPHAGOGASTRODUODENOSCOPY (EGD);  Surgeon: Rollin Dover, MD;  Location: [facility] ENDOSCOPY;  Service: Gastroenterology;  Laterality: N/A;   ESOPHAGOGASTRODUODENOSCOPY (EGD) WITH PROPOFOL  N/A 03/13/2019   Procedure: ESOPHAGOGASTRODUODENOSCOPY (EGD) WITH PROPOFOL ;  Surgeon: Albertus Gordy HERO, MD;  Location: [facility] ENDOSCOPY;  Service: Gastroenterology;  Laterality: N/A;   ESOPHAGOGASTRODUODENOSCOPY (EGD) WITH PROPOFOL  N/A 05/27/2020   Procedure: ESOPHAGOGASTRODUODENOSCOPY (EGD) WITH PROPOFOL ;  Surgeon: Cindie Carlin POUR, DO;  [facility];  Service: Endoscopy;  Laterality: N/A;   ESOPHAGOGASTRODUODENOSCOPY (EGD) WITH PROPOFOL  N/A 09/03/2022   Procedure: ESOPHAGOGASTRODUODENOSCOPY (EGD) WITH PROPOFOL ;  Surgeon: Rollin Dover, MD;  Location: [facility] ENDOSCOPY;  Service: Gastroenterology;  Laterality: N/A;   EYE SURGERY Bilateral    lazer   FLEXIBLE SIGMOIDOSCOPY N/A 05/23/2022   Procedure: FLEXIBLE SIGMOIDOSCOPY;  Surgeon: Rollin Dover, MD;  Location: [facility] ENDOSCOPY;  Service: Gastroenterology;  Laterality: N/A;   FLEXIBLE SIGMOIDOSCOPY N/A 05/24/2022   Procedure: FLEXIBLE SIGMOIDOSCOPY;  Surgeon: Federico Rosario BROCKS, MD;  Location: [facility] ENDOSCOPY;  Service: Gastroenterology;  Laterality: N/A;   FLEXIBLE SIGMOIDOSCOPY N/A 09/03/2022   Procedure: FLEXIBLE SIGMOIDOSCOPY;  Surgeon: Rollin Dover, MD;  Location: [facility] ENDOSCOPY;  Service: Gastroenterology;  Laterality: N/A;   HEMOSTASIS CLIP PLACEMENT  03/13/2019    Procedure: HEMOSTASIS CLIP PLACEMENT;  Surgeon: Albertus Gordy HERO, MD;  Location: [facility] ENDOSCOPY;  Service: Gastroenterology;;   HEMOSTASIS CLIP PLACEMENT  05/23/2022   Procedure: HEMOSTASIS CLIP PLACEMENT;  Surgeon: Rollin Dover, MD;  Location: [facility] ENDOSCOPY;  Service: Gastroenterology;;   HEMOSTASIS CONTROL  05/24/2022   Procedure: HEMOSTASIS CONTROL;  Surgeon: Federico Rosario BROCKS, MD;  Location: [facility] ENDOSCOPY;  Service: Gastroenterology;;   HOT HEMOSTASIS N/A 05/23/2022   Procedure: HOT HEMOSTASIS (ARGON PLASMA COAGULATION/BICAP);  Surgeon: Rollin Dover, MD;  Location: [facility] ENDOSCOPY;  Service: Gastroenterology;  Laterality: N/A;   I & D EXTREMITY Left 05/05/2022   Procedure: IRRIGATION AND DEBRIDEMENT LEFT ARM AV FISTULA;  Surgeon: Gretta Lonni PARAS, MD;  Location: [facility] OR;  Service: Vascular;  Laterality: Left;   I & D EXTREMITY N/A 11/14/2022   Procedure: IRRIGATION AND DEBRIDEMENT OF LOWER EXTREMITY WOUND;  Surgeon: Harden Jerona GAILS, MD;  [facility];  Service: Orthopedics;  Laterality: N/A;   INSERTION OF DIALYSIS CATHETER Right 04/02/2022  Procedure: INSERTION OF TUNNELED DIALYSIS CATHETER;  Surgeon: Serene Gaile ORN, MD;  Location: [facility] OR;  Service: Vascular;  Laterality: Right;   IR FLUORO GUIDE CV LINE RIGHT  08/10/2023   IR VENOCAVAGRAM IVC  08/10/2023   KNEE ARTHROSCOPY Left 10/25/2006   POLYPECTOMY  03/13/2019   Procedure: POLYPECTOMY;  Surgeon: Albertus Gordy HERO, MD;  Location: [facility] ENDOSCOPY;  Service: Gastroenterology;;   REMOVAL OF STONES  02/03/2022   Procedure: REMOVAL OF STONES;  Surgeon: Rollin Dover, MD;  Location: [facility] ENDOSCOPY;  Service: Gastroenterology;;   REVISON OF ARTERIOVENOUS FISTULA Left 08/20/2022   Procedure: REVISON OF LEFT ARM ARTERIOVENOUS FISTULA;  Surgeon: Serene Gaile ORN, MD;  Location[facility] OR;  Service: Vascular;  Laterality: Left;   RIGHT HEART CATH N/A 07/24/2017   Procedure: RIGHT HEART CATH;  Surgeon: Cherrie Toribio SAUNDERS, MD;  [facility];  Service:  Cardiovascular;  Laterality: N/A;   RIGHT HEART CATHETERIZATION N/A 09/22/2013   Procedure: RIGHT HEART CATH;  Surgeon: Toribio SAUNDERS Cherrie, MD;  Location: [facility] CATH LAB;  Service: Cardiovascular;  Laterality: N/A;   SHOULDER ARTHROSCOPY WITH OPEN ROTATOR CUFF REPAIR Right 03/14/2014   Procedure: RIGHT SHOULDER ARTHROSCOPY WITH BICEPS RELEASE, OPEN SUBSCAPULA REPAIR, OPEN SUPRASPINATUS REPAIR.;  Surgeon: Cordella Glendia Hutchinson, MD;  Location: [facility] OR;  Service: Orthopedics;  Laterality: Right;   SPHINCTEROTOMY  02/03/2022   Procedure: SPHINCTEROTOMY;  Surgeon: Rollin Dover, MD;  Location: [facility] ENDOSCOPY;  Service: Gastroenterology;;   STUMP REVISION Right 06/13/2022   Procedure: REVISION RIGHT BELOW KNEE AMPUTATION;  Surgeon: Harden Jerona GAILS, MD;  Location: [facility] OR;  Service: Orthopedics;  Laterality: Right;   TEE WITHOUT CARDIOVERSION N/A 02/04/2022   Procedure: TRANSESOPHAGEAL ECHOCARDIOGRAM (TEE);  Surgeon: Cherrie Toribio SAUNDERS, MD;  Location: [facility] ENDOSCOPY;  Service: Cardiovascular;  Laterality: N/A;   THROMBECTOMY W/ EMBOLECTOMY Left 08/20/2022   Procedure: THROMBECTOMY OF LEFT ARM ARTERIOVENOUS FISTULA;  Surgeon: Serene Gaile ORN, MD;  Location: [facility] OR;  Service: Vascular;  Laterality: Left;   TOE AMPUTATION Right 02/24/2018   GREAT TOE AND 2ND TOE AMPUTATION   TUBAL LIGATION  1970's   UPPER EXTREMITY VENOGRAPHY N/A 07/31/2023   Procedure: UPPER EXTREMITY VENOGRAPHY;  Surgeon: Magda Debby SAILOR, MD;  [facility]: [facility];  Service: Cardiovascular;  Laterality: N/A;   Patient Active Problem List   Diagnosis Date Noted   UTI (urinary tract infection) 10/01/2023   Pyelonephritis 08/06/2023   Complicated UTI (urinary tract infection) 08/05/2023   Atrial fibrillation (HCC) 06/17/2023   Atopic dermatitis 06/17/2023   Hyperosmolar hyperglycemic state (HHS) (HCC) 02/05/2023   Gastroparesis 02/05/2023   Depression 01/29/2023   Intractable nausea and vomiting 01/20/2023   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 difficile colitis 06/05/2022   Spleen hematoma 06/05/2022   Dehiscence of amputation stump of right lower extremity (HCC) 06/05/2022   Rectal ulcer 05/27/2022   ESRD (end stage renal disease) (HCC) 05/27/2022   GI bleed 05/23/2022   Difficult intravenous access 05/23/2022   Gangrene of  right foot (HCC) 05/02/2022   S/P BKA (below knee amputation) unilateral, right (HCC) 05/02/2022   Unspecified protein-calorie malnutrition (HCC) 04/15/2022   Secondary hyperparathyroidism of renal origin (HCC) 04/14/2022   Coagulation defect, unspecified (HCC) 04/09/2022   Acquired absence of other left toe(s) (HCC) 04/07/2022   Allergy, unspecified, initial encounter 04/07/2022   Dependence on renal dialysis (HCC) 04/07/2022   Gout due to renal impairment, unspecified site 04/07/2022   Hypertensive heart and chronic kidney disease with heart failure and with stage 5 chronic kidney disease, or end stage renal disease (HCC) 04/07/2022   Personal history of transient ischemic attack (TIA), and cerebral infarction without residual deficits 04/07/2022   Renal osteodystrophy 04/07/2022   Venous stasis ulcer of right calf (HCC) 03/31/2022   Fistula, colovaginal 03/26/2022   Diarrhea 03/26/2022   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   Sepsis without acute organ dysfunction (HCC)    Bacteremia    Acute pancreatitis 02/01/2022   Abdominal pain  02/01/2022   SIRS (systemic inflammatory response syndrome) (HCC) 02/01/2022   Transaminitis 02/01/2022   History of anemia due to chronic kidney disease 02/01/2022   Paroxysmal atrial fibrillation (HCC) 02/01/2022   Uncontrolled type 2 diabetes mellitus with hyperglycemia, with long-term current use of insulin  (HCC) 01/14/2022   NSTEMI (non-ST elevated myocardial infarction) (HCC) 03/05/2021   Acute renal failure superimposed on stage 4 chronic kidney disease (HCC) 08/22/2020   Hypoalbuminemia 05/25/2020   GERD (gastroesophageal reflux disease) 05/25/2020   Pressure injury of skin 05/17/2020   Acute on chronic combined systolic and diastolic congestive heart failure (HCC) 03/07/2020   Type 2 diabetes mellitus with diabetic polyneuropathy, with long-term current use of insulin  (HCC) 03/07/2020   Obesity, Class III, BMI 40-49.9 (morbid obesity) 03/07/2020   Common bile duct (CBD) obstruction 05/28/2019   Benign neoplasm of ascending colon    Benign neoplasm of transverse colon    Benign neoplasm of descending colon    Benign neoplasm of sigmoid colon    Gastric polyps    Hyperkalemia 03/11/2019   Prolonged QT interval 03/11/2019   Acute blood loss anemia 03/11/2019   Onychomycosis 06/21/2018   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)    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 (HCC)    PVD (peripheral vascular disease) (HCC) 10/26/2017   E-coli UTI 07/27/2017   Hypothyroidism 07/27/2017   AKI (acute kidney injury) (HCC)    PAH (pulmonary artery hypertension) (HCC)    Impaired ambulation 07/19/2017   Leg cramps 02/27/2017   Peripheral edema 01/12/2017   Diabetic neuropathy (HCC) 11/12/2016   Anemia of chronic disease 10/03/2015   Historical diagnosis of generalized anxiety disorder 10/03/2015   Secondary insomnia 10/03/2015   Acute bronchitis 09/05/2015   Hyperglycemia due to diabetes mellitus (HCC) 06/07/2015   Non compliance with medical treatment  04/17/2014   Rotator cuff tear 03/14/2014   Obesity 09/23/2013   Chronic HFrEF (heart failure with reduced ejection fraction) (HCC) 06/03/2013   Hypotension 12/25/2012   Hypokalemia 12/25/2012   Hyponatremia 12/25/2012   Urinary incontinence    MDD (major depressive disorder), recurrent episode, moderate (HCC) 11/12/2010   RBBB (right bundle branch block)    Wide-complex tachycardia    Coronary artery disease    Hyperlipemia 01/22/2009   Chronic hypotension 01/22/2009    ONSET DATE: 03/09/2023 MD referral to PT  REFERRING DIAG: S88.111D (ICD-10-CM) - Below-knee amputation of right lower extremity, subsequent encounter   THERAPY DIAG:  Muscle weakness (generalized)  Other abnormalities of gait  and mobility  Unsteadiness on feet  Impaired functional mobility, balance, gait, and endurance  Rationale for Evaluation and Treatment: Rehabilitation  SUBJECTIVE:  SUBJECTIVE STATEMENT: Patient arrived to session feeling unwell and exhausted. She has been feeling dizzy and unsteady with feelings of numbness of L foot. She was not able to stand and walk with husband's assistance previous to session.    PERTINENT HISTORY: Right TTA 05/02/22, Lymphedema, idiopathic chronic venous HTN with ulcer, depression, colitis, diverticulitis, ESRD, gout, NSTEMI, DM2, polyneuropathy, PVD, CAD, right bundle branch block, mini strokes  PAIN:  Are you having pain?  Yes Back pain today  6/10 at rest and during activity  Neck pain left side 5/10 & right side 7/10 Right shoulder 8/10  PRECAUTIONS: Fall and Other: No BP LUE  WEIGHT BEARING RESTRICTIONS: No  FALLS: Has patient fallen in last 6 months? No  LIVING ENVIRONMENT: Lives with: lives with their spouse and 2 small dogs Lives in: mobile home Home Access: Ramped entrance Home layout: One level Stairs: Yes: External: 6-7 steps; can reach both Has following equipment at home: Single point cane, Environmental consultant - 2 wheeled, Environmental consultant - 4 wheeled,  Wheelchair (manual), [employer], Grab bars, and Ramped entry  OCCUPATION:  retired  PLOF: prior to amputation for ~year used RW in home & community  PATIENT GOALS:  to use prosthesis to walk, get out w/c in & out of car, get to bathroom.   OBJECTIVE:  COGNITION: Overall cognitive status:  Eval on 03/23/2023:   Within functional limits for tasks assessed  POSTURE:  Eval on 03/23/2023:  rounded shoulders, forward head, increased thoracic kyphosis, flexed trunk , and weight shift left  LOWER EXTREMITY ROM: ROM Right eval Left eval  Hip flexion    Hip extension    Hip abduction    Hip adduction    Hip internal rotation    Hip external rotation    Knee flexion    Knee extension    Ankle dorsiflexion    Ankle plantarflexion    Ankle inversion    Ankle eversion     (Blank rows = not tested)  LOWER EXTREMITY MMT:  MMT Right eval Left eval Right 09/09/23 Left 09/09/23  Hip flexion 3-/5 3-/5 3/5 3/5  Hip extension 2/5 2/5 3-/5 3-/5  Hip abduction 2+/5 2+/5 3-/5 3-/5  Hip adduction      Hip internal rotation      Hip external rotation      Knee flexion 3-/5 3-/5 3/5 3/5  Knee extension 3-/5 3-/5 3/5 3/5  Ankle dorsiflexion      Ankle plantarflexion      Ankle inversion      Ankle eversion      At Evaluation all strength testing is grossly seated and functionally standing / gait. (Blank rows = not tested)  TRANSFERS: 09/07/2023: Scooting transfers with minA and sit to/from stand transfers with modA.   07/27/2023: -Sit>stand from wc with RW: MinA with heavy vc for sequencing and offweighting LE -Stand>sit wc with RW: CGA with patient performing safe controlled descent  06/15/2023: -Sit to stand transfers with RW wheelchair<>mat table at varied height: 19in with maxA to rise to stand; 22in ModA rise to stand, 24in min-modA rise to stand. -stand pivot transfer with RW w/c to mat table with modA once standing.  -Lateral scooting wc<>table at level height SBA; vc for LE  positioning for optimal use of LE during transfer -lateral scoot R: modA due to fatigue and uneven surface transfer from low  surface to higher surface   05/04/2023: Squat pivot transfers with modA.  04/21/2023: Pt sit to stand w/c to //bar with one hand pushing on w/c and other hand on //bar with modA first time & MinA 2nd/ 3rd time.  Best is RUE on //bar & LUE on w/c.  Stand to sit reaching LUE to w/c with minA to control descent.   PT demo & verbal cues on sit to/from stand w/c to RW.  1st time modA 2 person. 2nd time maxA one person.  Stand to sit with minA and constant cueing. Scooting transfers with minA with w/c & mat direct contact & same ht.   Eval on 03/23/2023:  Sit to stand: Max A (two-person assist) from 22 w/c to rolling walker Stand to sit: Max A (two-person assist) rolling walker to wheelchair  FUNCTIONAL TESTs:   09/09/2023: Patient able to tolerate static stance for 5 minutes with BUE support on RW with supervision.  This was current certification goal set so that patient can stand long enough for her husband to assist with cleaning her after toileting.  07/27/2023: Patient stood with RW SBA for 54sec before c/o lightheadedness and sitting down to toilet. Resisted nudges applied posteriorly to simulate hygienic cleaning. VC for weight shifts when LE become tired.  BP: after standing 21min54sec 161/82 HR 92  06/15/2023: -Static stance for standing with RW; requiring Mod-MaxA for rise to stand d/t patient unsteady on feet posterior lean without proper righting strategy, SBA for stand   04/21/2023: Pt able to stand 60 sec with BUE on //bars with supervision.  Pt able to stand for 1 min with RW support with minA.  Eval on 03/23/2023: Patient maintained upright holding rolling walker with mod assist 2 people for safety for 30 seconds.  GAIT: 09/09/2023: Patient ambulated 30' with RW including turning 90*to position to sit with mod assistance  07/27/2023: Patient  ambulated 74ft with narrow bathroom entry and 90deg turn to sit on commode CGA with minA and heavy vc for progression and sequencing of RW. While turning to sit on commode patient lost balance laterally requiring mod-maxA from therapist and patient use of grab bar to regain balance.   06/17/2023: Patient ambulated 57ft (maximal tolerated distance) with RW ModA (2 person for safety). Required VC for LE sequencing and minA to steer walker and prevent tip over.   04/21/2023: Pt amb 5' in //bars with modA (2 person for safety) with PT verbal & tactile cues on sequence and weight shift.  Pt amb 10' with RW with maxA (2 person for safety) with PT verbal & tactile cues on sequence and weight shift.  Eval on 03/23/2023:  Patient took 2 steps (one per LE) with +2 max assist with rolling walker and TTA prosthesis.  Patient adducting prosthesis stepping on left foot with no awareness.  CURRENT PROSTHETIC WEAR ASSESSMENT: 09/07/2023: Patient and husband are independent with: skin check, prosthetic cleaning, ply sock cleaning, proper wear schedule/adjustment, residual limb care, correct ply sock adjustment, and proper weight-bearing schedule/adjustment Pt reports wear most of awake hours on dialysis and non-dialysis days.  She wears prosthesis to dialysis now to assist with transfers prn and is able to verbalize proper adjustment of prosthesis weight for weigh-in & weigh-out.   06/17/2023:  Independence with prosthetic care:  Patient and husband are independent with: skin check, prosthetic cleaning, ply sock cleaning, proper wear schedule/adjustment Patient is dependent with: residual limb care, correct ply sock adjustment, and proper weight-bearing schedule/adjustment Donning prosthesis:  husband is independent with proper donning. Patient requires MaxA. Patient can verbally direct someone how to properly assist. Doffing prosthesis: Husband is independent with proper donning. Patient requires modA. Patient can  verbally direct someone how to properly assist. Prosthetic wear tolerance: majority of awake hours including dialysis hours, 1x/day, 3-4 days/week Prosthetic weight bearing tolerance: able to tolerate of standing without complains of prosthetic limb pain. Time not limited by limb discomfort but limited by muscle fatigue.   Eval on 03/23/2023:   Patient is dependent with: skin check, residual limb care, prosthetic cleaning, ply sock cleaning, correct ply sock adjustment, proper wear schedule/adjustment, and proper weight-bearing schedule/adjustment Donning prosthesis: Max A Doffing prosthesis: Min A Prosthetic wear tolerance: 2-6 hours, 1x/day, 3-4 days/week Prosthetic weight bearing tolerance: 3 minutes with limb pain Edema: pitting edema Residual limb condition: No open areas, dry skin, normal color and temperature, cylindrical shape Prosthetic description: Silicone liner with pin lock suspension, total contact socket with flexible inner socket, sach foot  TODAY'S TREATMENT:                                                                                                             DATE: 11/30/2023:  Vitals At Rest BP: seated, L UE, 122/68 HR: seated, L UE, 74 bpm  Prosthetic Training with Transtibial Prosthesis:  STS with minA with cues on technique Standing 2 minutes with RW support with minA. Performing cervical AROM. Pt reported slight increase in dizziness.  PT cued to focus on non-moving object directly in front of her.  She reported this did decrease her dizziness.  Patient ambulated 10' straight path, PT assisted with SPT supervision and chair following.   Patient ambulated 15' turning 90 degree to chair on her right. Husband assisted with SPT supervision and chair follow Patient ambulated 10' turning 90 degree to chair on her left. Husband assisted with SPT supervision. Instructed husband about safe guarding technique when pt is turning 90 degrees to sit in chair. Husband  verbalized understanding    TREATMENT:                                                                                                             DATE: 11/25/2023   Prosthetic Training with Transtibial Prosthesis:  -Sit to stand with minA 3 reps with cues on technique.  - Standing 3 minutes with rolling walker support with supervision. Performing cervical AROM. - PT educated about proper technique for STS with rolling walker. Patient verbalized understanding.  - Patient attempted to ambulate 25 ft turning 90 degree to chair on her right. Pt was  not able to turn 90 degrees to sit into chair due to fatigue and unsteadiness. Husband assisted with SPT supervision.  - Patient ambulated 5ft turning 90 degrees to chair on her right. Husband assisted with SPT supervision.    TODAY'S TREATMENT:                                                                                                             DATE: 11/23/2023    Prosthetic Training with Transtibial Prosthesis:  -Sit to stand with minA 3 reps with cues on technique.  -Standing 3 minutes with rolling walker support with supervision.  Performing cervical AROM.  - PT instructed husband in how to safely guard with his position and change sides that he is guarding if needed to help patient position herself to sit.   -PT demo and verbal cues on position of rolling walker and patient to decrease the number steps it takes to turn to position to sit. - Patient ambulated 27' turning 90* to chair on her right initiall with husband assisting and PT supervising. -PT reviewed with demo & verbal cues on proper sit down technique for safety.  Pt able to return demo following education.   Therapeutic Exercise: -PT reviewed cervical & lumbar AROM flexion, extension, rotation & side bend with deep breath at end range and looking where she is moving. Pt performed cervical 5 reps ea motion standing with RW support and 3 reps ea motion seated without back support.   Lumbar seated 3 reps ea motion.  Pt & husband verbalize understanding.     PATIENT EDUCATION: PATIENT EDUCATED ON FOLLOWING PROSTHETIC CARE: Education details: Use of stockinette under proximal liner to decrease itching sensation, no wounds presents to PT recommended not using Vive wear under liner Skin check, Prosthetic cleaning, Propper donning, and Proper wear schedule/adjustment Prosthetic wear tolerance: 3 hours 2x/day, 4 days/week Person educated: Patient and Spouse Education method: Explanation, Tactile cues, and Verbal cues Education comprehension: verbalized understanding, verbal cues required, tactile cues required, and needs further education  HOME EXERCISE PROGRAM: Access Code: [email or web address] URL: [email or web address] Date: 09/23/2023 Prepared by: Grayce Spatz  Exercises - Supine Bridge  - 1 x daily - 4 x weekly - 2 sets - 5 reps - 2 seconds hold - Supine Lower Trunk Rotation  - 1 x daily - 4 x weekly - 2 sets - 5 reps - 5 seconds hold - Supine Heel Slide with Strap  - 1 x daily - 4 x weekly - 2 sets - 5 reps - 2-3 seconds hold - Supine Hip Abduction  - 1 x daily - 4 x weekly - 2 sets - 5 reps - 2-3 seconds hold - Seated Hip Flexion Toward Target  - 1 x daily - 4 x weekly - 2 sets - 5 reps - 5 seconds hold - Seated Eccentric Abdominal Lean Back  - 1 x daily - 4 x weekly - 2 sets - 5 reps - 5 seconds hold - Seated Sidebending  - 1 x daily - 4 x weekly -  2 sets - 5 reps - 5 seconds hold - Seated Trunk Rotation with Crossed Arms  - 1 x daily - 4 x weekly - 2 sets - 5 reps - 5 seconds hold - Seated March  - 1 x daily - 4 x weekly - 2 sets - 10 reps - 5 seconds hold - Seated Knee Flexion Extension AROM   - 1 x daily - 4 x weekly - 2 sets - 10 reps - 5 seconds hold    ASSESSMENT:  CLINICAL IMPRESSION: Patient arrived to session feeling unwell which limited her ability to do exercises today. Vital signs were within normal range. Patient felt dizzy when standing initially  which subsided with time and was able to do cervical AROM until dizziness returned and had to sit down. She seemed fatigued and unsteady during ambulation activities, with cues to stand up straight and proper sitting mechanics, but was able to complete the exercises given. It was recommended she continue to do exercises when feeling a little unwell at a reduced intensity and with extra safety.   OBJECTIVE IMPAIRMENTS: Abnormal gait, decreased activity tolerance, decreased balance, decreased endurance, decreased knowledge of condition, decreased knowledge of use of DME, decreased mobility, difficulty walking, decreased ROM, decreased strength, decreased safety awareness, postural dysfunction, prosthetic dependency , and pain.   ACTIVITY LIMITATIONS: standing, transfers, and locomotion level  PARTICIPATION LIMITATIONS: community activity, household mobility and dependency / burden of care on family  PERSONAL FACTORS: Age, Fitness, Past/current experiences, Time since onset of injury/illness/exacerbation, and 3+ comorbidities: see PMH are also affecting patient's functional outcome.   REHAB POTENTIAL: Good  CLINICAL DECISION MAKING: Evolving/moderate complexity  EVALUATION COMPLEXITY: Moderate   GOALS: Goals reviewed with patient? Yes  SHORT TERM GOALS: Target date: 11/12/2023:  Patient & husband verbalized patient able to perform scooting transfer to / from [facility] and sit to/from stand [facility] to RW with husband's assistance safely. Baseline: SEE OBJECTIVE DATA Goal status:   MET  10/14/2023 2.  Patient able to sit to stand w/c to rolling walker with min assistance. Baseline: SEE OBJECTIVE DATA Goal status: partially MET (not consistently)  11/09/2023  3. Patient ambulates 38ft including turning 90* to position to sit with RW & prosthesis with modA.  Baseline: MET  11/16/2023  UPDATED LONG TERM GOALS: Target date: 12/03/2023  Patient & husband continues to demonstrate & verbalize understanding of  prosthetic care and wears prosthesis >80% of awake hours daily to enable safe utilization of prosthesis. Baseline: SEE OBJECTIVE DATA Goal status: Ongoing    11/23/2023  Patient and husband report patient is able to stand to enable cleaning after toileting safely with no issues Baseline: SEE OBJECTIVE DATA Goal status: Ongoing   11/23/2023  Patient able to perform sit to/from stand transfers with minA.  Baseline: SEE OBJECTIVE DATA Goal status: Ongoing    11/23/2023  Patient ambulates 63ft including turning 90* to position to sit with prosthesis & RW with minA with husband's assistance safely. Baseline: SEE OBJECTIVE DATA Goal status: ongoing   11/23/2023  Patient understanding of ongoing HEP. Baseline: SEE OBJECTIVE DATA Goal status:  Ongoing  11/23/2023  PLAN:  PT FREQUENCY: 2x/week  PT DURATION: 12 weeks  PLANNED INTERVENTIONS: 97164- PT Re-evaluation, 97110-Therapeutic exercises, 97530- Therapeutic activity, 97112- Neuromuscular re-education, [phone]- Self Care, [id number]- Gait training, [phone]- Prosthetic training, Patient/Family education, Balance training, DME instructions, Therapeutic exercises, Therapeutic activity, Neuromuscular re-education, Gait training, and Self Care  PLAN FOR NEXT SESSION:  Re-assess LTGs for planned discharge. Provide husband and patient  the tools and education needed to help with functional demands for patient at home.    Ismael Nap, Student-PT, DPT 11/30/2023, 2:59 PM  This entire session of physical therapy was performed under the direct supervision of PT signing evaluation /treatment. PT reviewed note and agrees.   Grayce Spatz, PT, DPT 11/30/2023, 5:13 PM

## 2023-12-01 ENCOUNTER — Other Ambulatory Visit: Payer: Self-pay | Admitting: Family Medicine

## 2023-12-01 DIAGNOSIS — N2581 Secondary hyperparathyroidism of renal origin: Secondary | ICD-10-CM | POA: Diagnosis not present

## 2023-12-01 DIAGNOSIS — Z992 Dependence on renal dialysis: Secondary | ICD-10-CM | POA: Diagnosis not present

## 2023-12-01 DIAGNOSIS — S88111D Complete traumatic amputation at level between knee and ankle, right lower leg, subsequent encounter: Secondary | ICD-10-CM

## 2023-12-01 DIAGNOSIS — D631 Anemia in chronic kidney disease: Secondary | ICD-10-CM | POA: Diagnosis not present

## 2023-12-01 DIAGNOSIS — N186 End stage renal disease: Secondary | ICD-10-CM | POA: Diagnosis not present

## 2023-12-01 DIAGNOSIS — D509 Iron deficiency anemia, unspecified: Secondary | ICD-10-CM | POA: Diagnosis not present

## 2023-12-01 DIAGNOSIS — D689 Coagulation defect, unspecified: Secondary | ICD-10-CM | POA: Diagnosis not present

## 2023-12-02 ENCOUNTER — Encounter: Payer: Self-pay | Admitting: Physical Therapy

## 2023-12-02 ENCOUNTER — Ambulatory Visit: Admitting: Physical Therapy

## 2023-12-02 ENCOUNTER — Telehealth: Payer: Self-pay | Admitting: Cardiology

## 2023-12-02 DIAGNOSIS — R2689 Other abnormalities of gait and mobility: Secondary | ICD-10-CM

## 2023-12-02 DIAGNOSIS — M6281 Muscle weakness (generalized): Secondary | ICD-10-CM

## 2023-12-02 DIAGNOSIS — R2681 Unsteadiness on feet: Secondary | ICD-10-CM

## 2023-12-02 DIAGNOSIS — Z7409 Other reduced mobility: Secondary | ICD-10-CM | POA: Diagnosis not present

## 2023-12-02 NOTE — Therapy (Signed)
 OUTPATIENT PHYSICAL THERAPY PROSTHETIC TREATMENT AND DISCHARGE SUMMARY  Patient Name: Susan [name] MRN: [id number] DOB:1950-10-11, 73 y.o., female Today's Date: 12/02/2023  PHYSICAL THERAPY DISCHARGE SUMMARY  Visits from Start of Care: 68  Current functional level related to goals / functional outcomes: See below   Remaining deficits: See below   Education / Equipment: Patient and husband were educated in prosthetic care, HEP, and assistance with functional activities. They appear to understand all    Patient agrees to discharge. Patient goals were partially met. Patient is being discharged due to meeting the stated rehab goals., and plateauing.   PCP: Duanne Butler DASEN, MD  REFERRING PROVIDER:  Jerona Sage, MD  END OF SESSION:  PT End of Session - 12/02/23 1348     Visit Number 55    Number of Visits 60    Date for PT Re-Evaluation 12/03/23    Authorization Type [employer]    PT Start Time 1348    PT Stop Time 1427    PT Time Calculation (min) 39 min    Equipment Utilized During Treatment Gait belt    Activity Tolerance Patient tolerated treatment well;Patient limited by fatigue    Behavior During Therapy WFL for tasks assessed/performed            Past Medical History:  Diagnosis Date   Anemia    hx   Anxiety    Arthritis    generalized (03/15/2014)   CAD (coronary artery disease)    MI in 2000 - MI  2007 - treated bare metal stent (no nuclear since then as 9/11)   Carotid artery disease (HCC)    Chronic diastolic heart failure (HCC)    a) ECHO (08/2013) EF 55-60% and RV function nl b) RHC (08/2013) RA 4, RV 30/5/7, PA 25/10 (16), PCWP 7, Fick CO/CI 6.3/2.7, PVR 1.5 WU, PA 61 and 66%   Daily headache    ~ every other day; since I fell in June (03/15/2014)   Depression    Diabetic retinopathy (HCC)    Dyslipidemia    ESRD (end stage renal disease) (HCC)    Dialysis on Tues Thurs Sat   Exertional shortness of breath    GERD  (gastroesophageal reflux disease)    History of blood transfusion    History of kidney stones    HTN (hypertension)    Hypothyroidism    Myocardial infarction (HCC)    Obesity    Osteoarthritis    PAF (paroxysmal atrial fibrillation) (HCC)    Peripheral neuropathy    bilateral feet/hands   PONV (postoperative nausea and vomiting)    RBBB (right bundle branch block)    Old   Stroke (HCC)    mini strokes   Type II diabetes mellitus (HCC)    Type II, [place] libre left upper arm. patient has omnipod insulin  pump with Novolin R Insulin    Past Surgical History:  Procedure Laterality Date   A/V FISTULAGRAM Left 11/07/2022   Procedure: A/V Fistulagram;  Surgeon: Magda Debby SAILOR, MD;  [facility]: [facility];  Service: Cardiovascular;  Laterality: Left;   ABDOMINAL HYSTERECTOMY  1980's   AMPUTATION Right 02/24/2018   Procedure: RIGHT FOOT GREAT TOE AND 2ND TOE AMPUTATION;  Surgeon: Sage Jerona GAILS, MD;  Location: [facility] OR;  Service: Orthopedics;  Laterality: Right;   AMPUTATION Right 04/30/2018   Procedure: RIGHT TRANSMETATARSAL AMPUTATION;  Surgeon: Sage Jerona GAILS, MD;  Location: [facility] OR;  Service: Orthopedics;  Laterality: Right;   AMPUTATION  Right 05/02/2022   Procedure: RIGHT BELOW KNEE AMPUTATION;  Surgeon: Harden Jerona GAILS, MD;  Location: [facility] OR;  Service: Orthopedics;  Laterality: Right;   APPLICATION OF WOUND VAC Right 06/13/2022   Procedure: APPLICATION OF WOUND VAC;  Surgeon: Harden Jerona GAILS, MD;  Location: [facility] OR;  Service: Orthopedics;  Laterality: Right;   APPLICATION OF WOUND VAC Left 11/14/2022   Procedure: APPLICATION OF WOUND VAC;  Surgeon: Harden Jerona GAILS, MD;  Location: [facility] OR;  Service: Orthopedics;  Laterality: Left;   AV FISTULA PLACEMENT Left 04/02/2022   Procedure: LEFT ARM ARTERIOVENOUS (AV) FISTULA CREATION;  Surgeon: Serene Gaile ORN, MD;  Location: [facility];  Service: Vascular;  Laterality: Left;  PERIPHERAL NERVE BLOCK   AV FISTULA PLACEMENT Right 04/27/2023   Procedure:  RIGHT ARM BRACHIOBASILIC ARTERIOVENOUS (AV) FISTULA CREATION;  Surgeon: Magda Debby SAILOR, MD;  Location: [facility] OR;  Service: Vascular;  Laterality: Right;   BASCILIC VEIN TRANSPOSITION Left 07/31/2022   Procedure: LEFT ARM SECOND STAGE BASILIC VEIN TRANSPOSITION;  Surgeon: Serene Gaile ORN, MD;  Location: [facility];  Service: Vascular;  Laterality: Left;   BIOPSY  05/27/2020   Procedure: BIOPSY;  Surgeon: Cindie Carlin POUR, DO;  Location: [facility];  Service: Endoscopy;;   CATARACT EXTRACTION, BILATERAL Bilateral ?2013   COLONOSCOPY W/ POLYPECTOMY     COLONOSCOPY WITH PROPOFOL  N/A 03/13/2019   Procedure: COLONOSCOPY WITH PROPOFOL ;  Surgeon: Albertus Gordy HERO, MD;  Location: [facility] ENDOSCOPY;  Service: Gastroenterology;  Laterality: N/A;   CORONARY ANGIOPLASTY WITH STENT PLACEMENT  1999; 2007   1 + 1   ERCP N/A 02/03/2022   Procedure: ENDOSCOPIC RETROGRADE CHOLANGIOPANCREATOGRAPHY (ERCP);  Surgeon: Rollin Dover, MD;  Location: [facility] ENDOSCOPY;  Service: Gastroenterology;  Laterality: N/A;   ESOPHAGOGASTRODUODENOSCOPY N/A 02/12/2023   Procedure: ESOPHAGOGASTRODUODENOSCOPY (EGD);  Surgeon: Rollin Dover, MD;  Location: [facility] ENDOSCOPY;  Service: Gastroenterology;  Laterality: N/A;   ESOPHAGOGASTRODUODENOSCOPY (EGD) WITH PROPOFOL  N/A 03/13/2019   Procedure: ESOPHAGOGASTRODUODENOSCOPY (EGD) WITH PROPOFOL ;  Surgeon: Albertus Gordy HERO, MD;  Location: [facility] ENDOSCOPY;  Service: Gastroenterology;  Laterality: N/A;   ESOPHAGOGASTRODUODENOSCOPY (EGD) WITH PROPOFOL  N/A 05/27/2020   Procedure: ESOPHAGOGASTRODUODENOSCOPY (EGD) WITH PROPOFOL ;  Surgeon: Cindie Carlin POUR, DO;  [facility];  Service: Endoscopy;  Laterality: N/A;   ESOPHAGOGASTRODUODENOSCOPY (EGD) WITH PROPOFOL  N/A 09/03/2022   Procedure: ESOPHAGOGASTRODUODENOSCOPY (EGD) WITH PROPOFOL ;  Surgeon: Rollin Dover, MD;  Location: [facility] ENDOSCOPY;  Service: Gastroenterology;  Laterality: N/A;   EYE SURGERY Bilateral    lazer   FLEXIBLE SIGMOIDOSCOPY N/A 05/23/2022    Procedure: FLEXIBLE SIGMOIDOSCOPY;  Surgeon: Rollin Dover, MD;  Location: [facility] ENDOSCOPY;  Service: Gastroenterology;  Laterality: N/A;   FLEXIBLE SIGMOIDOSCOPY N/A 05/24/2022   Procedure: FLEXIBLE SIGMOIDOSCOPY;  Surgeon: Federico Rosario BROCKS, MD;  Location: [facility] ENDOSCOPY;  Service: Gastroenterology;  Laterality: N/A;   FLEXIBLE SIGMOIDOSCOPY N/A 09/03/2022   Procedure: FLEXIBLE SIGMOIDOSCOPY;  Surgeon: Rollin Dover, MD;  Location: [facility] ENDOSCOPY;  Service: Gastroenterology;  Laterality: N/A;   HEMOSTASIS CLIP PLACEMENT  03/13/2019   Procedure: HEMOSTASIS CLIP PLACEMENT;  Surgeon: Albertus Gordy HERO, MD;  Location: [facility] ENDOSCOPY;  Service: Gastroenterology;;   HEMOSTASIS CLIP PLACEMENT  05/23/2022   Procedure: HEMOSTASIS CLIP PLACEMENT;  Surgeon: Rollin Dover, MD;  Location: [facility] ENDOSCOPY;  Service: Gastroenterology;;   HEMOSTASIS CONTROL  05/24/2022   Procedure: HEMOSTASIS CONTROL;  Surgeon: Federico Rosario BROCKS, MD;  Location: [facility] ENDOSCOPY;  Service: Gastroenterology;;   HOT HEMOSTASIS N/A 05/23/2022   Procedure: HOT HEMOSTASIS (ARGON PLASMA COAGULATION/BICAP);  Surgeon: Rollin Dover, MD;  Location: [facility]OS[facility];  Service: Gastroenterology;  Laterality: N/A;   I & D EXTREMITY Left 05/05/2022   Procedure: IRRIGATION AND DEBRIDEMENT LEFT ARM AV FISTULA;  Surgeon: Gretta Lonni PARAS, MD;  Location: [facility] OR;  Service: Vascular;  Laterality: Left;   I & D EXTREMITY N/A 11/14/2022   Procedure: IRRIGATION AND DEBRIDEMENT OF LOWER EXTREMITY WOUND;  Surgeon: Harden Jerona GAILS, MD;  Location: [facility] OR;  Service: Orthopedics;  Laterality: N/A;   INSERTION OF DIALYSIS CATHETER Right 04/02/2022   Procedure: INSERTION OF TUNNELED DIALYSIS CATHETER;  Surgeon: Serene Gaile ORN, MD;  Location: [facility] OR;  Service: Vascular;  Laterality: Right;   IR FLUORO GUIDE CV LINE RIGHT  08/10/2023   IR VENOCAVAGRAM IVC  08/10/2023   KNEE ARTHROSCOPY Left 10/25/2006   POLYPECTOMY  03/13/2019   Procedure: POLYPECTOMY;  Surgeon: Albertus Gordy HERO, MD;   Location: [facility] ENDOSCOPY;  Service: Gastroenterology;;   REMOVAL OF STONES  02/03/2022   Procedure: REMOVAL OF STONES;  Surgeon: Rollin Dover, MD;  Location: [facility] ENDOSCOPY;  Service: Gastroenterology;;   REVISON OF ARTERIOVENOUS FISTULA Left 08/20/2022   Procedure: REVISON OF LEFT ARM ARTERIOVENOUS FISTULA;  Surgeon: Serene Gaile ORN, MD;  Location: [facility] OR;  Service: Vascular;  Laterality: Left;   RIGHT HEART CATH N/A 07/24/2017   Procedure: RIGHT HEART CATH;  Surgeon: Cherrie Toribio SAUNDERS, MD;  [facility];  Service: Cardiovascular;  Laterality: N/A;   RIGHT HEART CATHETERIZATION N/A 09/22/2013   Procedure: RIGHT HEART CATH;  Surgeon: Toribio SAUNDERS Cherrie, MD;  Location: [facility] CATH LAB;  Service: Cardiovascular;  Laterality: N/A;   SHOULDER ARTHROSCOPY WITH OPEN ROTATOR CUFF REPAIR Right 03/14/2014   Procedure: RIGHT SHOULDER ARTHROSCOPY WITH BICEPS RELEASE, OPEN SUBSCAPULA REPAIR, OPEN SUPRASPINATUS REPAIR.;  Surgeon: Cordella Glendia Hutchinson, MD;  Location: [facility] OR;  Service: Orthopedics;  Laterality: Right;   SPHINCTEROTOMY  02/03/2022   Procedure: SPHINCTEROTOMY;  Surgeon: Rollin Dover, MD;  Location: [facility] ENDOSCOPY;  Service: Gastroenterology;;   STUMP REVISION Right 06/13/2022   Procedure: REVISION RIGHT BELOW KNEE AMPUTATION;  Surgeon: Harden Jerona GAILS, MD;  Location: [facility] OR;  Service: Orthopedics;  Laterality: Right;   TEE WITHOUT CARDIOVERSION N/A 02/04/2022   Procedure: TRANSESOPHAGEAL ECHOCARDIOGRAM (TEE);  Surgeon: Cherrie Toribio SAUNDERS, MD;  Location: [facility] ENDOSCOPY;  Service: Cardiovascular;  Laterality: N/A;   THROMBECTOMY W/ EMBOLECTOMY Left 08/20/2022   Procedure: THROMBECTOMY OF LEFT ARM ARTERIOVENOUS FISTULA;  Surgeon: Serene Gaile ORN, MD;  Location: [facility] OR;  Service: Vascular;  Laterality: Left;   TOE AMPUTATION Right 02/24/2018   GREAT TOE AND 2ND TOE AMPUTATION   TUBAL LIGATION  1970's   UPPER EXTREMITY VENOGRAPHY N/A 07/31/2023   Procedure: UPPER EXTREMITY VENOGRAPHY;  Surgeon: Magda Debby SAILOR, MD;  Location: [facility];  Service: Cardiovascular;  Laterality: N/A;   Patient Active Problem List   Diagnosis Date Noted   UTI (urinary tract infection) 10/01/2023   Pyelonephritis 08/06/2023   Complicated UTI (urinary tract infection) 08/05/2023   Atrial fibrillation (HCC) 06/17/2023   Atopic dermatitis 06/17/2023   Hyperosmolar hyperglycemic state (HHS) (HCC) 02/05/2023   Gastroparesis 02/05/2023   Depression 01/29/2023   Intractable nausea and vomiting 01/20/2023   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) 04/07/2022   Allergy, unspecified, initial encounter 04/07/2022   Dependence on renal dialysis (HCC) 04/07/2022   Gout due to renal impairment, unspecified site 04/07/2022   Hypertensive heart and chronic  kidney disease with heart failure and with stage 5 chronic kidney disease, or end stage renal disease (HCC) 04/07/2022   Personal history of transient ischemic attack (TIA), and cerebral infarction without residual deficits 04/07/2022   Renal osteodystrophy 04/07/2022   Venous stasis ulcer of right calf (HCC) 03/31/2022   Fistula, colovaginal 03/26/2022   Diarrhea 03/26/2022   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   Sepsis without acute organ dysfunction (HCC)    Bacteremia    Acute pancreatitis 02/01/2022   Abdominal pain 02/01/2022   SIRS (systemic inflammatory response syndrome) (HCC) 02/01/2022   Transaminitis 02/01/2022   History of anemia due to chronic kidney disease 02/01/2022   Paroxysmal atrial fibrillation (HCC) 02/01/2022   Uncontrolled type 2 diabetes mellitus with hyperglycemia, with long-term current use of insulin  (HCC) 01/14/2022   NSTEMI (non-ST elevated myocardial infarction) (HCC) 03/05/2021   Acute renal failure superimposed on stage 4 chronic kidney disease (HCC) 08/22/2020   Hypoalbuminemia 05/25/2020   GERD (gastroesophageal reflux disease) 05/25/2020   Pressure injury of skin 05/17/2020   Acute on chronic combined systolic and diastolic congestive heart failure (HCC) 03/07/2020   Type 2 diabetes mellitus with diabetic polyneuropathy, with long-term current use of insulin  (HCC) 03/07/2020   Obesity, Class III, BMI 40-49.9 (morbid obesity) 03/07/2020   Common bile duct (CBD) obstruction 05/28/2019   Benign neoplasm of ascending colon    Benign neoplasm of transverse colon    Benign neoplasm of descending colon    Benign neoplasm of sigmoid colon    Gastric polyps    Hyperkalemia 03/11/2019   Prolonged QT interval 03/11/2019   Acute blood loss anemia 03/11/2019   Onychomycosis 06/21/2018   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)    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 (HCC)    PVD (peripheral vascular disease) (HCC) 10/26/2017    E-coli UTI 07/27/2017   Hypothyroidism 07/27/2017   AKI (acute kidney injury) (HCC)    PAH (pulmonary artery hypertension) (HCC)    Impaired ambulation 07/19/2017   Leg cramps 02/27/2017   Peripheral edema 01/12/2017   Diabetic neuropathy (HCC) 11/12/2016   Anemia of chronic disease 10/03/2015   Historical diagnosis of generalized anxiety disorder 10/03/2015   Secondary insomnia 10/03/2015   Acute bronchitis 09/05/2015   Hyperglycemia due to diabetes mellitus (HCC) 06/07/2015   Non compliance with medical treatment 04/17/2014   Rotator cuff tear 03/14/2014   Obesity 09/23/2013   Chronic HFrEF (heart failure with reduced ejection fraction) (HCC) 06/03/2013   Hypotension 12/25/2012  Hypokalemia 12/25/2012   Hyponatremia 12/25/2012   Urinary incontinence    MDD (major depressive disorder), recurrent episode, moderate (HCC) 11/12/2010   RBBB (right bundle branch block)    Wide-complex tachycardia    Coronary artery disease    Hyperlipemia 01/22/2009   Chronic hypotension 01/22/2009    ONSET DATE: 03/09/2023 MD referral to PT  REFERRING DIAG: S88.111D (ICD-10-CM) - Below-knee amputation of right lower extremity, subsequent encounter   THERAPY DIAG:  Muscle weakness (generalized)  Other abnormalities of gait and mobility  Unsteadiness on feet  Impaired functional mobility, balance, gait, and endurance   Rationale for Evaluation and Treatment: Rehabilitation  SUBJECTIVE:  SUBJECTIVE STATEMENT: Patient arrives to session feeling unwell and fatigued. Patient and husband have been trying to stand and walk at home but husband says it has not been stable. With standing, walking, and rest, R LE has muscle spasms.    PERTINENT HISTORY: Right TTA 05/02/22, Lymphedema, idiopathic chronic venous HTN with ulcer, depression, colitis, diverticulitis, ESRD, gout, NSTEMI, DM2, polyneuropathy, PVD, CAD, right bundle branch block, mini strokes  PAIN:  Are you having pain?  Yes Back pain  today  6/10 at rest and during activity  Neck pain left side 5/10 & right side 7/10 Right shoulder 8-9/10  PRECAUTIONS: Fall and Other: No BP LUE  WEIGHT BEARING RESTRICTIONS: No  FALLS: Has patient fallen in last 6 months? No  LIVING ENVIRONMENT: Lives with: lives with their spouse and 2 small dogs Lives in: mobile home Home Access: Ramped entrance Home layout: One level Stairs: Yes: External: 6-7 steps; can reach both Has following equipment at home: Single point cane, Environmental consultant - 2 wheeled, Environmental consultant - 4 wheeled, Wheelchair (manual), [employer], Grab bars, and Ramped entry  OCCUPATION:  retired  PLOF: prior to amputation for ~year used RW in home & community  PATIENT GOALS:  to use prosthesis to walk, get out w/c in & out of car, get to bathroom.   OBJECTIVE:  COGNITION: Overall cognitive status:  Eval on 03/23/2023:   Within functional limits for tasks assessed  POSTURE:  Eval on 03/23/2023:  rounded shoulders, forward head, increased thoracic kyphosis, flexed trunk , and weight shift left  LOWER EXTREMITY ROM: ROM Right eval Left eval  Hip flexion    Hip extension    Hip abduction    Hip adduction    Hip internal rotation    Hip external rotation    Knee flexion    Knee extension    Ankle dorsiflexion    Ankle plantarflexion    Ankle inversion    Ankle eversion     (Blank rows = not tested)  LOWER EXTREMITY MMT:  MMT Right eval Left eval Right 09/09/23 Left 09/09/23  Hip flexion 3-/5 3-/5 3/5 3/5  Hip extension 2/5 2/5 3-/5 3-/5  Hip abduction 2+/5 2+/5 3-/5 3-/5  Hip adduction      Hip internal rotation      Hip external rotation      Knee flexion 3-/5 3-/5 3/5 3/5  Knee extension 3-/5 3-/5 3/5 3/5  Ankle dorsiflexion      Ankle plantarflexion      Ankle inversion      Ankle eversion      At Evaluation all strength testing is grossly seated and functionally standing / gait. (Blank rows = not tested)  TRANSFERS: 12/02/2023: Sit to stand with minA  wheelchair to rolling walker and stand to sit with CGA.  Patient and husband appear to understand technique.  09/07/2023: Scooting transfers with minA and sit to/from stand transfers with modA.   07/27/2023: -Sit>stand from wc with RW: MinA with heavy vc for sequencing and offweighting LE -Stand>sit wc with RW: CGA with patient performing safe controlled descent  06/15/2023: -Sit to stand transfers with RW wheelchair<>mat table at varied height: 19in with maxA to rise to stand; 22in ModA rise to stand, 24in min-modA rise to stand. -stand pivot transfer with RW w/c to mat table with modA once standing.  -Lateral scooting wc<>table at level height SBA; vc for LE positioning for optimal use of LE during transfer -lateral scoot R: modA due to fatigue and uneven surface transfer from low surface to higher surface   05/04/2023: Squat pivot transfers with modA.  04/21/2023: Pt sit to stand w/c to //bar with one hand pushing on w/c and other hand on //bar with modA first time & MinA 2nd/ 3rd time.  Best is RUE on //bar & LUE on w/c.  Stand to sit reaching LUE to w/c with minA to control descent.   PT demo & verbal cues on sit to/from stand w/c to RW.  1st time modA 2 person. 2nd time maxA one person.  Stand to sit with minA and constant cueing. Scooting transfers with minA with w/c & mat direct contact & same ht.   Eval on 03/23/2023:  Sit to stand: Max A (two-person assist) from 22 w/c to rolling walker Stand to sit: Max A (two-person assist) rolling walker to wheelchair  FUNCTIONAL TESTs:   12/02/23: Stand pivot transfer with RW with husband assisting safely.  09/09/2023: Patient able to tolerate static stance for 5 minutes with BUE support on RW with supervision.  This was current certification goal set so that patient can stand long enough for her husband to assist with cleaning her after toileting.  07/27/2023: Patient stood with RW SBA for 54sec before c/o lightheadedness and sitting  down to toilet. Resisted nudges applied posteriorly to simulate hygienic cleaning. VC for weight shifts when LE become tired.  BP: after standing 38min54sec 161/82 HR 92  06/15/2023: -Static stance for standing with RW; requiring Mod-MaxA for rise to stand d/t patient unsteady on feet posterior lean without proper righting strategy, SBA for stand   04/21/2023: Pt able to stand 60 sec with BUE on //bars with supervision.  Pt able to stand for 1 min with RW support with minA.  Eval on 03/23/2023: Patient maintained upright holding rolling walker with mod assist 2 people for safety for 30 seconds.  GAIT: 12/02/2023:  Patient ambulated 20 feet with RW with husband's assistance safely.  She continues to fatigue easily.  Husband verbalizes understanding of how to set up a safe situation to continue to work on gait at home.  09/09/2023: Patient ambulated 30' with RW including turning 90*to position to sit with mod assistance  07/27/2023: Patient ambulated 63ft with narrow bathroom entry and 90deg turn to sit on commode CGA with minA and heavy vc for progression and sequencing of RW. While turning to sit on commode patient lost balance laterally requiring mod-maxA from therapist and patient use of grab bar to regain balance.   06/17/2023: Patient ambulated 71ft (maximal tolerated distance) with RW ModA (2 person for safety). Required VC for LE sequencing and minA to steer walker and prevent tip over.   04/21/2023: Pt amb 5' in //bars with modA (2 person for safety) with PT verbal & tactile cues on sequence and weight shift.  Pt amb 10'  with RW with maxA (2 person for safety) with PT verbal & tactile cues on sequence and weight shift.  Eval on 03/23/2023:  Patient took 2 steps (one per LE) with +2 max assist with rolling walker and TTA prosthesis.  Patient adducting prosthesis stepping on left foot with no awareness.  CURRENT PROSTHETIC WEAR ASSESSMENT: 12/02/2023: Patient wears prosthesis for  most awake hours.  She is wearing prosthesis most of her awake hours to enable function throughout her day.  Patient and her husband are independent with prosthetic care.  09/07/2023: Patient and husband are independent with: skin check, prosthetic cleaning, ply sock cleaning, proper wear schedule/adjustment, residual limb care, correct ply sock adjustment, and proper weight-bearing schedule/adjustment Pt reports wear most of awake hours on dialysis and non-dialysis days.  She wears prosthesis to dialysis now to assist with transfers prn and is able to verbalize proper adjustment of prosthesis weight for weigh-in & weigh-out.   06/17/2023:  Independence with prosthetic care:  Patient and husband are independent with: skin check, prosthetic cleaning, ply sock cleaning, proper wear schedule/adjustment Patient is dependent with: residual limb care, correct ply sock adjustment, and proper weight-bearing schedule/adjustment Donning prosthesis: husband is independent with proper donning. Patient requires MaxA. Patient can verbally direct someone how to properly assist. Doffing prosthesis: Husband is independent with proper donning. Patient requires modA. Patient can verbally direct someone how to properly assist. Prosthetic wear tolerance: majority of awake hours including dialysis hours, 1x/day, 3-4 days/week Prosthetic weight bearing tolerance: able to tolerate of standing without complains of prosthetic limb pain. Time not limited by limb discomfort but limited by muscle fatigue.   Eval on 03/23/2023:   Patient is dependent with: skin check, residual limb care, prosthetic cleaning, ply sock cleaning, correct ply sock adjustment, proper wear schedule/adjustment, and proper weight-bearing schedule/adjustment Donning prosthesis: Max A Doffing prosthesis: Min A Prosthetic wear tolerance: 2-6 hours, 1x/day, 3-4 days/week Prosthetic weight bearing tolerance: 3 minutes with limb pain Edema: pitting  edema Residual limb condition: No open areas, dry skin, normal color and temperature, cylindrical shape Prosthetic description: Silicone liner with pin lock suspension, total contact socket with flexible inner socket, sach foot  TODAY'S TREATMENT:                                                                                                             DATE: 12/02/2023:  Prosthetic Training with Transtibial Prosthesis:  Patient reported understanding of HEP. Husband and patient agree that standing has facilitated toileting cleaning safely and more thoroughly.  Also the bowel program including using bedside commode instead of diapers has decreased UTIs and skin issues. STS using RW with minA.  Patient and husband verbalized understanding of proper technique. Standing for 1:31 seconds using RW, with supervision.  Patient attempted to ambulate 6' turning 90 degree to chair on her right. Pt made it 20' and had to sit into chair due to fatigue and unsteadiness. Husband demonstrated safe assistance for mobility at home. Stand pivot transfer with RW from chair to WC. Husband demonstrated safe assistance  for activity at home.   TREATMENT:                                                                                                             DATE: 11/30/2023:  Vitals At Rest BP: seated, L UE, 122/68 HR: seated, L UE, 74 bpm  Prosthetic Training with Transtibial Prosthesis:  STS with minA with cues on technique Standing 2 minutes with RW support with minA. Performing cervical AROM. Pt reported slight increase in dizziness.  PT cued to focus on non-moving object directly in front of her.  She reported this did decrease her dizziness.  Patient ambulated 10' straight path, PT assisted with SPT supervision and chair following.   Patient ambulated 15' turning 90 degree to chair on her right. Husband assisted with SPT supervision and chair follow Patient ambulated 10' turning 90 degree to chair on her  left. Husband assisted with SPT supervision. Instructed husband about safe guarding technique when pt is turning 90 degrees to sit in chair. Husband verbalized understanding    TREATMENT:                                                                                                             DATE: 11/25/2023   Prosthetic Training with Transtibial Prosthesis:  - Sit to stand with minA 3 reps with cues on technique.  - Standing 3 minutes with rolling walker support with supervision. Performing cervical AROM. - PT educated about proper technique for STS with rolling walker. Patient verbalized understanding.  - Patient attempted to ambulate 25 ft turning 90 degree to chair on her right. Pt was not able to turn 90 degrees to sit into chair due to fatigue and unsteadiness. Husband assisted with SPT supervision.  - Patient ambulated 74ft turning 90 degrees to chair on her right. Husband assisted with SPT supervision.    TODAY'S TREATMENT:                                                                                                             DATE: 11/23/2023    Prosthetic Training with Transtibial Prosthesis:  -Sit to stand  with minA 3 reps with cues on technique.  -Standing 3 minutes with rolling walker support with supervision.  Performing cervical AROM.  - PT instructed husband in how to safely guard with his position and change sides that he is guarding if needed to help patient position herself to sit.   -PT demo and verbal cues on position of rolling walker and patient to decrease the number steps it takes to turn to position to sit. - Patient ambulated 27' turning 90* to chair on her right initiall with husband assisting and PT supervising. -PT reviewed with demo & verbal cues on proper sit down technique for safety.  Pt able to return demo following education.   Therapeutic Exercise: -PT reviewed cervical & lumbar AROM flexion, extension, rotation & side bend with deep breath at end  range and looking where she is moving. Pt performed cervical 5 reps ea motion standing with RW support and 3 reps ea motion seated without back support.  Lumbar seated 3 reps ea motion.  Pt & husband verbalize understanding.     PATIENT EDUCATION: PATIENT EDUCATED ON FOLLOWING PROSTHETIC CARE: Education details: Use of stockinette under proximal liner to decrease itching sensation, no wounds presents to PT recommended not using Vive wear under liner Skin check, Prosthetic cleaning, Propper donning, and Proper wear schedule/adjustment Prosthetic wear tolerance: 3 hours 2x/day, 4 days/week Person educated: Patient and Spouse Education method: Explanation, Tactile cues, and Verbal cues Education comprehension: verbalized understanding, verbal cues required, tactile cues required, and needs further education  HOME EXERCISE PROGRAM: Access Code: [email or web address] URL: [email or web address] Date: 09/23/2023 Prepared by: Grayce Spatz  Exercises - Supine Bridge  - 1 x daily - 4 x weekly - 2 sets - 5 reps - 2 seconds hold - Supine Lower Trunk Rotation  - 1 x daily - 4 x weekly - 2 sets - 5 reps - 5 seconds hold - Supine Heel Slide with Strap  - 1 x daily - 4 x weekly - 2 sets - 5 reps - 2-3 seconds hold - Supine Hip Abduction  - 1 x daily - 4 x weekly - 2 sets - 5 reps - 2-3 seconds hold - Seated Hip Flexion Toward Target  - 1 x daily - 4 x weekly - 2 sets - 5 reps - 5 seconds hold - Seated Eccentric Abdominal Lean Back  - 1 x daily - 4 x weekly - 2 sets - 5 reps - 5 seconds hold - Seated Sidebending  - 1 x daily - 4 x weekly - 2 sets - 5 reps - 5 seconds hold - Seated Trunk Rotation with Crossed Arms  - 1 x daily - 4 x weekly - 2 sets - 5 reps - 5 seconds hold - Seated March  - 1 x daily - 4 x weekly - 2 sets - 10 reps - 5 seconds hold - Seated Knee Flexion Extension AROM   - 1 x daily - 4 x weekly - 2 sets - 10 reps - 5 seconds hold    ASSESSMENT:  CLINICAL IMPRESSION: Patient has made  significant progress with PT with functional mobility.  She is wearing prosthesis most of her awake hours to enable function throughout her day.  Patient and her husband are independent with prosthetic care.  Patient's husband is able to safely assist patient with standing, transfers and gait with a rolling walker.  Prior to PT patient was wheelchair and/or bedbound with significant burden of care.   OBJECTIVE IMPAIRMENTS: Abnormal gait,  decreased activity tolerance, decreased balance, decreased endurance, decreased knowledge of condition, decreased knowledge of use of DME, decreased mobility, difficulty walking, decreased ROM, decreased strength, decreased safety awareness, postural dysfunction, prosthetic dependency , and pain.   ACTIVITY LIMITATIONS: standing, transfers, and locomotion level  PARTICIPATION LIMITATIONS: community activity, household mobility and dependency / burden of care on family  PERSONAL FACTORS: Age, Fitness, Past/current experiences, Time since onset of injury/illness/exacerbation, and 3+ comorbidities: see PMH are also affecting patient's functional outcome.   REHAB POTENTIAL: Good  CLINICAL DECISION MAKING: Evolving/moderate complexity  EVALUATION COMPLEXITY: Moderate   GOALS: Goals reviewed with patient? Yes  SHORT TERM GOALS: Target date: 11/12/2023:  Patient & husband verbalized patient able to perform scooting transfer to / from [facility] and sit to/from stand [facility] to RW with husband's assistance safely. Baseline: SEE OBJECTIVE DATA Goal status:   MET  10/14/2023 2.  Patient able to sit to stand w/c to rolling walker with min assistance. Baseline: SEE OBJECTIVE DATA Goal status: partially MET (not consistently)  11/09/2023  3. Patient ambulates 56ft including turning 90* to position to sit with RW & prosthesis with modA.  Baseline: MET  11/16/2023  UPDATED LONG TERM GOALS: Target date: 12/03/2023  Patient & husband continues to demonstrate & verbalize  understanding of prosthetic care and wears prosthesis >80% of awake hours daily to enable safe utilization of prosthesis. Baseline: SEE OBJECTIVE DATA Goal status: MET, 12/02/2023  Patient and husband report patient is able to stand to enable cleaning after toileting safely with no issues Baseline: SEE OBJECTIVE DATA Goal status: MET, 12/02/2023  Patient able to perform sit to/from stand transfers with minA.  Baseline: SEE OBJECTIVE DATA Goal status: MET, 12/02/2023   Patient ambulates 64ft including turning 90* to position to sit with prosthesis & RW with minA with husband's assistance safely. Baseline: SEE OBJECTIVE DATA Goal status: NOT MET, 12/02/2023  Patient understanding of ongoing HEP. Baseline: SEE OBJECTIVE DATA Goal status:  MET, 12/02/2023  PLAN:  PT FREQUENCY: 2x/week  PT DURATION: 12 weeks  PLANNED INTERVENTIONS: 97164- PT Re-evaluation, 97110-Therapeutic exercises, 97530- Therapeutic activity, 97112- Neuromuscular re-education, [phone]- Self Care, [id number]- Gait training, [phone]- Prosthetic training, Patient/Family education, Balance training, DME instructions, Therapeutic exercises, Therapeutic activity, Neuromuscular re-education, Gait training, and Self Care  PLAN FOR NEXT SESSION:  Discharge PT.    Ismael Nap, Student-PT, DPT 12/02/2023, 3:01 PM  This entire session of physical therapy was performed under the direct supervision of PT signing evaluation /treatment. PT reviewed note and agrees.   Grayce Spatz, PT, DPT 12/02/2023, 4:17 PM

## 2023-12-02 NOTE — Telephone Encounter (Signed)
   Pre-operative Risk Assessment    Patient Name: Susan [name]  DOB: 1951/04/30 MRN: [id number]      Request for Surgical Clearance    Procedure:  Vitrectomy with endolaser   Date of Surgery:  Clearance TBD                                 Surgeon:  Dr Jarold  Surgeon's Group or Practice Name:  [facility]  Phone number:  [phone] Fax number:  [phone]   Type of Clearance Requested:   - Pharmacy:  Hold Apixaban  (Eliquis ) Heparin  ?   Type of Anesthesia:  Not Indicated   Additional requests/questions:    [name] [name] [name]   12/02/2023, 2:40 PM

## 2023-12-02 NOTE — Telephone Encounter (Signed)
 Pharmacy please advise on holding Eliquis  prior to  Vitrectomy with endolaser  scheduled for TBD. Thank you.

## 2023-12-04 NOTE — Telephone Encounter (Signed)
 Pt is calling checking on clearance because surgeon told her they don't have it. Please advise.

## 2023-12-07 NOTE — Telephone Encounter (Signed)
 Helping in preop today. As below, routed to pharm for input. Last [facility] OV was in 02/2023, recommended for 3 month follow-up but did not occur. I do think she would benefit from OV as part of clearance given complex history - she is slated to see HF APP in 2 days for scheduled appt. I will route to their APP team to please relay update on surgical clearance for eye surgery based on their findings at that OV. [facility] APP - - Please route response to P [facility] PREOP (the pre-op  pool). Thank you!

## 2023-12-07 NOTE — Telephone Encounter (Signed)
 Patient with diagnosis of afib on Eliquis  for anticoagulation.    Procedure: Vitrectomy with endolaser  Date of procedure: TBD   CHA2DS2-VASc Score = 7   This indicates a 11.2% annual risk of stroke. The patient's score is based upon: CHF History: 1 HTN History: 0 Diabetes History: 1 Stroke History: 2 Vascular Disease History: 1 Age Score: 1 Gender Score: 1      ESRD on HD Platelet count 191  Patient has not had an Afib/aflutter ablation within the last 3 months or DCCV within the last 30 days  Per office protocol, patient can hold Eliquis  for 2 days prior to procedure.    **This guidance is not considered finalized until pre-operative APP has relayed final recommendations.**

## 2023-12-08 DIAGNOSIS — N186 End stage renal disease: Secondary | ICD-10-CM | POA: Diagnosis not present

## 2023-12-08 DIAGNOSIS — Z992 Dependence on renal dialysis: Secondary | ICD-10-CM | POA: Diagnosis not present

## 2023-12-08 DIAGNOSIS — N2581 Secondary hyperparathyroidism of renal origin: Secondary | ICD-10-CM | POA: Diagnosis not present

## 2023-12-08 DIAGNOSIS — E1122 Type 2 diabetes mellitus with diabetic chronic kidney disease: Secondary | ICD-10-CM | POA: Diagnosis not present

## 2023-12-08 DIAGNOSIS — D509 Iron deficiency anemia, unspecified: Secondary | ICD-10-CM | POA: Diagnosis not present

## 2023-12-08 DIAGNOSIS — D689 Coagulation defect, unspecified: Secondary | ICD-10-CM | POA: Diagnosis not present

## 2023-12-08 NOTE — Progress Notes (Signed)
 Advanced Heart Failure Clinic Note   PCP:  Duanne Butler DASEN, MD Nephrology: Dr Gearline HF Cardiologist: Dr. Cherrie   HPI:  Susan [name] is a 73 y.o. female with morbid obesity, RBBB, DM2, Hypothyroidism diastolic heart failure, CAD MI 2000/2007 BMS 2007, TIA, ESRD on HD and PAD.  Had home sleep study 07/25/20 and this was negative for OSA.   Admitted 10/22 with NSTEMI. Echo with decline in EF to 30-35% and new inferior WMA. Suspect progression in RCA disease.Managed medically due to CKD IV (Cr ~3.5).    Echo 7/23 showed EF 25-30%, severe LV dysfunction, grade II DD, RV mildly reduced.  Admitted 8/23 with DKA. Blood glucose > 1200. Ltd Echo showed EF 25-30%, RV moderately down. Hospitalization c/b AFib with RVR. Cards consulted and she was started on amio and Eliquis . PT rec SNF/CIR, she declined. She was discharged home, weight 244 lbs.  Admitted 9/23 with gallstone pancreatitis and CBD stone s/p extraction in addition to Enterococcus faecalis and E. Faecium bacteremia. TEE was negative for endocarditis, LVEF 30%, RV mod HK w/ severe TR. She was improving so planned for conservative management w/o surgery, per general surgery. AHF team consulted for help with volume management. GDMT limited due to low BP and CKD. Received IV abx per ID.  Discharged home, weight 235 lbs. Of note, C-Diff + after discharge, and started on po vanco qid by ID.  Patient started HD for ESRD in November 2023.  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 Readmitted later in the month with diarrhea and bloody bowel movements. She required 4 u RBCS. GI consulted and underwent oversewing of rectal ulcer. Readmitted 01/24 with constipation and stercoral colitis. Also positive for C.diff. During admission also needed R BKA revision and placement of VAC d/t wound dehiscence.  Admitted 4/23 with proctitis and found to have splenic infarct. Anticoagulation restarted (had been stopped d/t GI bleeding). Later  developed WCT concerning for VT versus atrial flutter with aberrancy. Cardiology consulted and stated on amio gtt. She had no further recurrence and was transitioned back to PO. not a candidate for ICD/device due to active infection and ESRD with HD line in place. She was discharged on 4/12.  Echo 4/24 EF 30-35%, RV mildly reduced  Admitted 6/24 with necrotic tissue to LLE, required IV abx and wound debridement with wound Vac. She was discharged to CIR. Re-admitted to N/V 8/24, felt to be 2/2 gastroparesis.  S/p R brachiobasilic arteriovenous fistula 12/24  Admitted 3/25 with urosepsis, 2/2 ESBL E. Coli. Treated with IV abx, R SCV HD catheter changed out in IR due to bacteremia.   Admitted 5/25 with re-current UTI. Treated with meropenem .   Today she returns for HF follow up. Overall feeling fair. Takes nitroglycerin  1-2 every couple of weeks for palpitations. occurs during dialysis and sometimes at night. She can walk 20-30 feet but ill-fitting prosthetic limits her. Doing home exercises, graduated from PT. Mostly WC bound. No SOB with transfers out of WC. Denies abnormal bleeding, CP, dizziness, edema, or PND/Orthopnea. Appetite ok. Taking all medications. Wears 3L oxygen . Needs Pre-Op  eval for vitrectomy, vision compromised in left eye.   Cardiac Studies: - Echo 4/24 EF 30-35%, RV mildly reduced  - TEE (9/23): EF 30%, LV moderately decreased function, RV moderately decreased function, no valvular vegetation, severe TR, moderate plaque in descending aorta.  - Echo (9/23): EF 30%, severe LV dysfunction, RV to mildly reduced, severe TR.  - Ltd Echo (8/23): EF 30-35%,  moderately decreased LV, RV moderately reduced, mild to moderate TR  - Echo (7/23): EF 25-30%, severe LV dysfunction, grade II DD, RV mildly reduced.  - Echo (10/22): EF 30-35%  - Echo (9/21): EF 55-60%, RV ok  - Echo (10/19): EF 55-60%, Grade I DD  - RHC (3/19): showed mild PAH with evidence of RV strain likely due to  OHS/OSA.   RA = 17 RV = 48/15 PA = 49/11 (30) PCW = 18 Fick cardiac output/index = 7.0/3.0 PVR = 0.5 WU Ao sat = 97% PA sat = 62%, 64%  1. Normal left-sided pressures 2. Mild PAH with evidence of RV strain likely due to OHS/OSA 3. Normal cardiac output  - Lexiscan  w/ stress echo (2018): EF 68%, low risk  - LHC (2007): BMS distal RCA  - Cardiolite  (03/2002): EF of 51% but no ischemia  - LHC (2000): BMS RCA   ROS: All systems reviewed and negative except as per HPI.   Allergies  Allergen Reactions   Cephalexin  Diarrhea and Other (See Comments)   Codeine Nausea And Vomiting and Other (See Comments)   Past Medical History:  Diagnosis Date   Anemia    hx   Anxiety    Arthritis    generalized (03/15/2014)   CAD (coronary artery disease)    MI in 2000 - MI  2007 - treated bare metal stent (no nuclear since then as 9/11)   Carotid artery disease (HCC)    Chronic diastolic heart failure (HCC)    a) ECHO (08/2013) EF 55-60% and RV function nl b) RHC (08/2013) RA 4, RV 30/5/7, PA 25/10 (16), PCWP 7, Fick CO/CI 6.3/2.7, PVR 1.5 WU, PA 61 and 66%   Daily headache    ~ every other day; since I fell in June (03/15/2014)   Depression    Diabetic retinopathy (HCC)    Dyslipidemia    ESRD (end stage renal disease) (HCC)    Dialysis on Tues Thurs Sat   Exertional shortness of breath    GERD (gastroesophageal reflux disease)    History of blood transfusion    History of kidney stones    HTN (hypertension)    Hypothyroidism    Myocardial infarction (HCC)    Obesity    Osteoarthritis    PAF (paroxysmal atrial fibrillation) (HCC)    Peripheral neuropathy    bilateral feet/hands   PONV (postoperative nausea and vomiting)    RBBB (right bundle branch block)    Old   Stroke (HCC)    mini strokes   Type II diabetes mellitus (HCC)    Type II, [place] libre left upper arm. patient has omnipod insulin  pump with Novolin R Insulin    Family History  Problem Relation Age of  Onset   Heart attack Mother 40   Past Surgical History:  Procedure Laterality Date   A/V FISTULAGRAM Left 11/07/2022   Procedure: A/V Fistulagram;  Surgeon: Magda Debby SAILOR, MD;  [facility];  Service: Cardiovascular;  Laterality: Left;   ABDOMINAL HYSTERECTOMY  1980's   AMPUTATION Right 02/24/2018   Procedure: RIGHT FOOT GREAT TOE AND 2ND TOE AMPUTATION;  Surgeon: Harden Jerona GAILS, MD;  Location: [facility] OR;  Service: Orthopedics;  Laterality: Right;   AMPUTATION Right 04/30/2018   Procedure: RIGHT TRANSMETATARSAL AMPUTATION;  Surgeon: Harden Jerona GAILS, MD;  Location: [facility] OR;  Service: Orthopedics;  Laterality: Right;   AMPUTATION Right 05/02/2022   Procedure: RIGHT BELOW KNEE AMPUTATION;  Surgeon: Harden Jerona GAILS, MD;  Location:  [facility] OR;  Service: Orthopedics;  Laterality: Right;   APPLICATION OF WOUND VAC Right 06/13/2022   Procedure: APPLICATION OF WOUND VAC;  Surgeon: Harden Jerona GAILS, MD;  [facility]: [facility];  Service: Orthopedics;  Laterality: Right;   APPLICATION OF WOUND VAC Left 11/14/2022   Procedure: APPLICATION OF WOUND VAC;  Surgeon: Harden Jerona GAILS, MD;  [facility];  Service: Orthopedics;  Laterality: Left;   AV FISTULA PLACEMENT Left 04/02/2022   Procedure: LEFT ARM ARTERIOVENOUS (AV) FISTULA CREATION;  Surgeon: Serene Gaile ORN, MD;  [facility]: [facility];  Service: Vascular;  Laterality: Left;  PERIPHERAL NERVE BLOCK   AV FISTULA PLACEMENT Right 04/27/2023   Procedure: RIGHT ARM BRACHIOBASILIC ARTERIOVENOUS (AV) FISTULA CREATION;  Surgeon: Magda Debby SAILOR, MD;  [facility];  Service: Vascular;  Laterality: Right;   BASCILIC VEIN TRANSPOSITION Left 07/31/2022   Procedure: LEFT ARM SECOND STAGE BASILIC VEIN TRANSPOSITION;  Surgeon: Serene Gaile ORN, MD;  [facility]: [facility];  Service: Vascular;  Laterality: Left;   BIOPSY  05/27/2020   Procedure: BIOPSY;  Surgeon: Cindie Carlin POUR, DO;  [facility]: [facility];  Service: Endoscopy;;   CATARACT EXTRACTION, BILATERAL Bilateral ?2013    COLONOSCOPY W/ POLYPECTOMY     COLONOSCOPY WITH PROPOFOL  N/A 03/13/2019   Procedure: COLONOSCOPY WITH PROPOFOL ;  Surgeon: Albertus Gordy HERO, MD;  Location: [facility] ENDOSCOPY;  Service: Gastroenterology;  Laterality: N/A;   CORONARY ANGIOPLASTY WITH STENT PLACEMENT  1999; 2007   1 + 1   ERCP N/A 02/03/2022   Procedure: ENDOSCOPIC RETROGRADE CHOLANGIOPANCREATOGRAPHY (ERCP);  Surgeon: Rollin Dover, MD;  Location: [facility] ENDOSCOPY;  Service: Gastroenterology;  Laterality: N/A;   ESOPHAGOGASTRODUODENOSCOPY N/A 02/12/2023   Procedure: ESOPHAGOGASTRODUODENOSCOPY (EGD);  Surgeon: Rollin Dover, MD;  Location: [name] ENDOSCOPY;  Service: Gastroenterology;  Laterality: N/A;   ESOPHAGOGASTRODUODENOSCOPY (EGD) WITH PROPOFOL  N/A 03/13/2019   Procedure: ESOPHAGOGASTRODUODENOSCOPY (EGD) WITH PROPOFOL ;  Surgeon: Albertus Gordy HERO, MD;  [name] ENDOSCOPY;  Service: Gastroenterology;  Laterality: N/A;   ESOPHAGOGASTRODUODENOSCOPY (EGD) WITH PROPOFOL  N/A 05/27/2020   Procedure: ESOPHAGOGASTRODUODENOSCOPY (EGD) WITH PROPOFOL ;  Surgeon: Cindie Carlin POUR, DO;  [facility];  Service: Endoscopy;  Laterality: N/A;   ESOPHAGOGASTRODUODENOSCOPY (EGD) WITH PROPOFOL  N/A 09/03/2022   Procedure: ESOPHAGOGASTRODUODENOSCOPY (EGD) WITH PROPOFOL ;  Surgeon: Rollin Dover, MD;  Location: [facility] ENDOSCOPY;  Service: Gastroenterology;  Laterality: N/A;   EYE SURGERY Bilateral    lazer   FLEXIBLE SIGMOIDOSCOPY N/A 05/23/2022   Procedure: FLEXIBLE SIGMOIDOSCOPY;  Surgeon: Rollin Dover, MD;  Location: [facility] ENDOSCOPY;  Service: Gastroenterology;  Laterality: N/A;   FLEXIBLE SIGMOIDOSCOPY N/A 05/24/2022   Procedure: FLEXIBLE SIGMOIDOSCOPY;  Surgeon: Federico Rosario BROCKS, MD;  Location: [facility] ENDOSCOPY;  Service: Gastroenterology;  Laterality: N/A;   FLEXIBLE SIGMOIDOSCOPY N/A 09/03/2022   Procedure: FLEXIBLE SIGMOIDOSCOPY;  Surgeon: Rollin Dover, MD;  Location: [facility] ENDOSCOPY;  Service: Gastroenterology;  Laterality: N/A;   HEMOSTASIS CLIP PLACEMENT   03/13/2019   Procedure: HEMOSTASIS CLIP PLACEMENT;  Surgeon: Albertus Gordy HERO, MD;  Location: [facility] ENDOSCOPY;  Service: Gastroenterology;;   HEMOSTASIS CLIP PLACEMENT  05/23/2022   Procedure: HEMOSTASIS CLIP PLACEMENT;  Surgeon: Rollin Dover, MD;  Location: [facility] ENDOSCOPY;  Service: Gastroenterology;;   HEMOSTASIS CONTROL  05/24/2022   Procedure: HEMOSTASIS CONTROL;  Surgeon: Federico Rosario BROCKS, MD;  Location: [facility] ENDOSCOPY;  Service: Gastroenterology;;   HOT HEMOSTASIS N/A 05/23/2022   Procedure: HOT HEMOSTASIS (ARGON PLASMA COAGULATION/BICAP);  Surgeon: Rollin Dover, MD;  Location: [facility] ENDOSCOPY;  Service: Gastroenterology;  Laterality: N/A;   I & D EXTREMITY Left 05/05/2022  Procedure: IRRIGATION AND DEBRIDEMENT LEFT ARM AV FISTULA;  Surgeon: Gretta Lonni PARAS, MD;  Location: [facility] OR;  Service: Vascular;  Laterality: Left;   I & D EXTREMITY N/A 11/14/2022   Procedure: IRRIGATION AND DEBRIDEMENT OF LOWER EXTREMITY WOUND;  Surgeon: Harden Jerona GAILS, MD;  [facility];  Service: Orthopedics;  Laterality: N/A;   INSERTION OF DIALYSIS CATHETER Right 04/02/2022   Procedure: INSERTION OF TUNNELED DIALYSIS CATHETER;  Surgeon: Serene Gaile ORN, MD;  [facility];  Service: Vascular;  Laterality: Right;   IR FLUORO GUIDE CV LINE RIGHT  08/10/2023   IR VENOCAVAGRAM IVC  08/10/2023   KNEE ARTHROSCOPY Left 10/25/2006   POLYPECTOMY  03/13/2019   Procedure: POLYPECTOMY;  Surgeon: Albertus Gordy HERO, MD;  Location: [facility] ENDOSCOPY;  Service: Gastroenterology;;   REMOVAL OF STONES  02/03/2022   Procedure: REMOVAL OF STONES;  Surgeon: Rollin Dover, MD;  Location: [facility] ENDOSCOPY;  Service: Gastroenterology;;   REVISON OF ARTERIOVENOUS FISTULA Left 08/20/2022   Procedure: REVISON OF LEFT ARM ARTERIOVENOUS FISTULA;  Surgeon: Serene Gaile ORN, MD;  [facility];  Service: Vascular;  Laterality: Left;   RIGHT HEART CATH N/A 07/24/2017   Procedure: RIGHT HEART CATH;  Surgeon: Cherrie Toribio SAUNDERS, MD;  [facility];   Service: Cardiovascular;  Laterality: N/A;   RIGHT HEART CATHETERIZATION N/A 09/22/2013   Procedure: RIGHT HEART CATH;  Surgeon: Toribio SAUNDERS Cherrie, MD;  Location: [facility] CATH LAB;  Service: Cardiovascular;  Laterality: N/A;   SHOULDER ARTHROSCOPY WITH OPEN ROTATOR CUFF REPAIR Right 03/14/2014   Procedure: RIGHT SHOULDER ARTHROSCOPY WITH BICEPS RELEASE, OPEN SUBSCAPULA REPAIR, OPEN SUPRASPINATUS REPAIR.;  Surgeon: Cordella Glendia Hutchinson, MD;  Location: [facility] OR;  Service: Orthopedics;  Laterality: Right;   SPHINCTEROTOMY  02/03/2022   Procedure: SPHINCTEROTOMY;  Surgeon: Rollin Dover, MD;  Location: [facility] ENDOSCOPY;  Service: Gastroenterology;;   STUMP REVISION Right 06/13/2022   Procedure: REVISION RIGHT BELOW KNEE AMPUTATION;  Surgeon: Harden Jerona GAILS, MD;  Location: [facility] OR;  Service: Orthopedics;  Laterality: Right;   TEE WITHOUT CARDIOVERSION N/A 02/04/2022   Procedure: TRANSESOPHAGEAL ECHOCARDIOGRAM (TEE);  Surgeon: Cherrie Toribio SAUNDERS, MD;  Location: [facility] ENDOSCOPY;  Service: Cardiovascular;  Laterality: N/A;   THROMBECTOMY W/ EMBOLECTOMY Left 08/20/2022   Procedure: THROMBECTOMY OF LEFT ARM ARTERIOVENOUS FISTULA;  Surgeon: Serene Gaile ORN, MD;  Location: [facility] OR;  Service: Vascular;  Laterality: Left;   TOE AMPUTATION Right 02/24/2018   GREAT TOE AND 2ND TOE AMPUTATION   TUBAL LIGATION  1970's   UPPER EXTREMITY VENOGRAPHY N/A 07/31/2023   Procedure: UPPER EXTREMITY VENOGRAPHY;  Surgeon: Magda Debby SAILOR, MD;  [facility];  Service: Cardiovascular;  Laterality: N/A;   Social History   Socioeconomic History   Marital status: Married    Spouse name: [place]   Number of children: 3   Years of education: 12th   Highest education level: Not on file  Occupational History    Employer: UNEMPLOYED  Tobacco Use   Smoking status: Former    Current packs/day: 0.00    Average packs/day: 3.0 packs/day for 32.0 years (96.0 ttl pk-yrs)    Types: Cigarettes    Start date: 10/24/1965    Quit date:  10/24/1997    Years since quitting: 26.1   Smokeless tobacco: Never  Vaping Use   Vaping status: Never Used  Substance and Sexual Activity   Alcohol use: Not Currently   Drug use: No   Sexual activity: Not Currently    Birth control/protection:  Surgical    Comment: Hysterectomy  Other Topics Concern   Not on file  Social History Narrative   Pt lives at home with her spouse.Caffeine Use- 3 sodas daily.    Social Drivers of Corporate investment banker Strain: Low Risk  (08/16/2021)   Overall Financial Resource Strain (CARDIA)    Difficulty of Paying Living Expenses: Not hard at all  Food Insecurity: No Food Insecurity (10/01/2023)   Hunger Vital Sign    Worried About Running Out of Food in the Last Year: Never true    Ran Out of Food in the Last Year: Never true  Transportation Needs: No Transportation Needs (10/01/2023)   PRAPARE - Administrator, Civil Service (Medical): No    Lack of Transportation (Non-Medical): No  Physical Activity: Inactive (08/16/2021)   Exercise Vital Sign    Days of Exercise per Week: 0 days    Minutes of Exercise per Session: 0 min  Stress: No Stress Concern Present (08/16/2021)   [employer] of Occupational Health - Occupational Stress Questionnaire    Feeling of Stress : Not at all  Social Connections: Socially Isolated (10/01/2023)   Social Connection and Isolation Panel    Frequency of Communication with Friends and Family: Once a week    Frequency of Social Gatherings with Friends and Family: Once a week    Attends Religious Services: Never    Database administrator or Organizations: No    Attends Banker Meetings: Never    Marital Status: Married  Catering manager Violence: Not At Risk (10/01/2023)   Humiliation, Afraid, Rape, and Kick questionnaire    Fear of Current or Ex-Partner: No    Emotionally Abused: No    Physically Abused: No    Sexually Abused: No   Lipid Panel     Component Value Date/Time   CHOL 92  02/01/2022 0615   TRIG 143 02/01/2022 0615   HDL 34 (L) 02/01/2022 0615   CHOLHDL 2.7 02/01/2022 0615   VLDL 29 02/01/2022 0615   LDLCALC 29 02/01/2022 0615   LDLCALC 65 01/17/2019 1608   Current Outpatient Medications on File Prior to Encounter  Medication Sig Dispense Refill   acetaminophen  (TYLENOL ) 325 MG tablet Take 2 tablets (650 mg total) by mouth every 6 (six) hours as needed for mild pain (or Fever >/= 101).     albuterol  (VENTOLIN  HFA) 108 (90 Base) MCG/ACT inhaler Inhale 1-2 puffs into the lungs every 6 (six) hours as needed for shortness of breath. 1 each 3   amiodarone  (PACERONE ) 200 MG tablet Take 1 tablet (200 mg total) by mouth daily. 90 tablet 3   apixaban  (ELIQUIS ) 5 MG TABS tablet Take 1 tablet (5 mg total) by mouth 2 (two) times daily. 180 tablet 1   ascorbic acid  (VITAMIN C ) 500 MG tablet Take 1 tablet (500 mg total) by mouth daily. 100 tablet 0   atorvastatin  (LIPITOR ) 80 MG tablet Take 1 tablet (80 mg total) by mouth daily. 90 tablet 1   calcitRIOL  (ROCALTROL ) 0.25 MCG capsule Take 9 capsules (2.25 mcg total) by mouth Every Tuesday,Thursday,and Saturday with dialysis. 30 capsule 0   clonazePAM  (KLONOPIN ) 0.5 MG disintegrating tablet Take 1 tablet (0.5 mg total) by mouth 2 (two) times daily. Stop lorazepam . 60 tablet 1   folic acid  (FOLVITE ) 1 MG tablet Take 1 mg by mouth daily.     folic acid -vitamin b complex-vitamin c -selenium-zinc  (DIALYVITE) 3 MG TABS tablet Take 1 tablet by mouth  daily.     Insulin  Disposable Pump (OMNIPOD 5 [id number] INTRO GEN 5) KIT Inject 200 Units into the skin 3 days. Using Novolog  for pod     levothyroxine  (SYNTHROID ) 50 MCG tablet Take 1 tablet (50 mcg total) by mouth daily before breakfast. 90 tablet 1   melatonin 3 MG TABS tablet Take 3 mg by mouth at bedtime.     methocarbamol  (ROBAXIN ) 750 MG tablet TAKE 1 TABLET (750 MG TOTAL) BY MOUTH EVERY 6 (SIX) HOURS AS NEEDED FOR MUSCLE SPASMS 60 tablet 0   Methoxy PEG-Epoetin  Beta (MIRCERA IJ)  Mircera     midodrine  (PROAMATINE ) 10 MG tablet Take 1-2 tablets (10-20 mg total) by mouth Every Tuesday,Thursday,and Saturday with dialysis. 30 tablet 1   nitroGLYCERIN  (NITROSTAT ) 0.4 MG SL tablet Place 1 tablet (0.4 mg total) under the tongue every 5 (five) minutes as needed. 25 tablet 3   OXYGEN  Inhale 2 L into the lungs at bedtime.     pantoprazole  (PROTONIX ) 40 MG tablet Take 1 tablet (40 mg total) by mouth daily. 180 tablet 1   polyethylene glycol (MIRALAX  / GLYCOLAX ) 17 g packet Take 17 g by mouth daily.     predniSONE  (DELTASONE ) 20 MG tablet 3 tabs poqday 1-2, 2 tabs poqday 3-4, 1 tab poqday 5-6 12 tablet 0   pregabalin  (LYRICA ) 150 MG capsule Take 150 mg by mouth 2 (two) times daily.     sevelamer  carbonate (RENVELA ) 800 MG tablet Take 1,600 mg by mouth 3 (three) times daily with meals.     venlafaxine  XR (EFFEXOR -XR) 75 MG 24 hr capsule Take 1 capsule (75 mg total) by mouth daily with breakfast. Stop trintellix  90 capsule 1   Zinc  30 MG CAPS Take 30 mg by mouth daily.     No current facility-administered medications on file prior to encounter.   Wt Readings from Last 3 Encounters:  12/09/23 104.8 kg (231 lb)  10/26/23 76.2 kg (168 lb)  10/15/23 76.2 kg (168 lb)   BP 92/60   Pulse 97   Ht 5' 5 (1.651 m)   Wt 104.8 kg (231 lb)   SpO2 97%   BMI 38.44 kg/m   PHYSICAL EXAM: General:  NAD. No resp difficulty, arrived in [facility], chronically-ill appearing. HEENT: Normal Neck: Supple. No JVD. Cor: Regular rate & rhythm. No rubs, gallops or murmurs. Lungs: Clear Abdomen: Soft, nontender, nondistended.  Extremities: No cyanosis, clubbing, rash, s/p R BKA Neuro: Alert & oriented x 3, moves all 4 extremities w/o difficulty. Affect pleasant.  ECG (personally reviewed):  SR with RBBB  ASSESSMENT AND PLAN:  1. Chronic Biventricular CHF - Echo (9/21): EF 55-60%, RV normal  - Echo (10/22): EF down to 30-35% in setting of NSTEMI, Grade II DD. - Echo (7/23): EF 25-30%, RV mildly  reduced. - Ltd Echo (8/23): EF 30-35%, RV moderately reduced - Echo (9/23): EF 30%, interventricular septum flattened in diastole consistent with RV volume overload, RV low normal, severe TR - Echo 4/24 EF 30-35%, RV mildly reduced, moderate TR  - NYHA likely IIb-III, confounded by mostly WC-bound and difficulty walking on prosthesis. No resting dyspnea.  - Volume now controlled by iHD, volume stable on exam today.   - Off  beta blocker with on-going hypotension  - Continue midodrine  before HD - Labs followed at HD  - Update echo next visit.   2. CAD  - Known CAD w/ h/o remote MI in 2000 s/p BMS to RCA and again in 2007 w/ BMS to  RCA  - Admitted 10/22 NSTEM (hstrop 247) Echo with decline in EF to 30-35% and new inferior WMA. Suspect progression in RCA disease. No cath due CKD IV - No CP, takes nitroglycerin  for palpitations. - Previously not candidate for cath with CKD IV unless ACS or persistent, severe angina--> now ESRD. - Continue atorvastatin  80 mg daily. - Off ASA with need for AC. - Off Imdur  due to hypotension.    3. h/o Gallstone pancreatitis - Lipase 1341>112>60 - s/p ERCP on 02/03/22 with biliary sphincterotomy and balloon extraction. - No evidence of cholecystitis on imaging.  4. h/o Enterococcus faecalis and E faecium bacteremia - Repeat BC X 2 09/11 NGTD - GI source suspected. - No evidence of endocarditis on TEE - ID following and she is now off linezolid .   5 . Paroxysmal AF/AFL - SR with rBBB on ECG today. - Continue amiodarone  200 mg daily. Recent TSH and HFTs ok (9/24) - Continue Eliquis  5 mg bid. Will need to watch for recurrent GIB (denies any issues)   6. ESRD - iHD T,TH,Sat  - Suspect diabetic nephropathy  - Continue midodrine  before dialysis sessions.  7. Uncontrolled DM II w/ Complications  - Hgb A1c 15>>9>>6.8 - On insulin   - Followed by PCP and Endocrinology  - No SGLT2i   8. Anemia of CKD - Labs followed at HD    9. H/o C-Difficile  infection - Completed vancomycin  - Resolved  10. S/p Rt BKA - Complication of poorly controlled DM  - She has a prosthesis - Followed by Dr. Harden   11. Pre-Op  CV risk stratification - planning Vitrectomy, using MAC anesthesia - RCRI score = 4 pts (10% risk of major cardiac event). - She is at least moderate risk from CV standpoint for procedure, but not prohibitive.  - OK to hold Eliquis  2 days before procedure, resume as soon as safe to do so afterwards.  Follow up with Dr. Cherrie in 3 months + echo    Harlene HERO [place], [place]  12/09/23

## 2023-12-08 NOTE — Telephone Encounter (Signed)
   Name: TANASHIA CIESLA  DOB: 29-Jan-1951  MRN: [id number]  Primary Cardiologist: Toribio Fuel, MD  Chart reviewed as part of pre-operative protocol coverage. The patient has an upcoming visit scheduled with Heart Failure APP on 12/09/23 at which time clearance can be addressed in case there are any issues that would impact surgical recommendations.  Vitrectomy with endolaser is not scheduled as below. I added preop FYI to appointment note so that provider is aware to address at time of outpatient visit.  Per office protocol the cardiology provider should forward their finalized clearance decision and recommendations regarding antiplatelet therapy to the requesting party below.    Per office protocol, patient can hold Eliquis  for 2 days prior to procedure. Please resume when safe to do so from a bleeding standpoint.   I will route this message as FYI to requesting party and remove this message from the preop box as separate preop APP input not needed at this time.   Please call with any questions.  Mahaley Schwering D Akira Adelsberger, NP  12/08/2023, 7:47 AM

## 2023-12-09 ENCOUNTER — Ambulatory Visit (HOSPITAL_COMMUNITY)
Admission: RE | Admit: 2023-12-09 | Discharge: 2023-12-09 | Disposition: A | Discharge status: Home / Self Care | Source: Ambulatory Visit | Attending: Family Medicine | Admitting: Family Medicine

## 2023-12-09 ENCOUNTER — Encounter (HOSPITAL_COMMUNITY): Payer: Self-pay

## 2023-12-09 VITALS — BP 92/60 | HR 97 | Ht 65.0 in | Wt 231.0 lb

## 2023-12-09 DIAGNOSIS — I2510 Atherosclerotic heart disease of native coronary artery without angina pectoris: Secondary | ICD-10-CM | POA: Insufficient documentation

## 2023-12-09 DIAGNOSIS — E039 Hypothyroidism, unspecified: Secondary | ICD-10-CM | POA: Diagnosis not present

## 2023-12-09 DIAGNOSIS — E1165 Type 2 diabetes mellitus with hyperglycemia: Secondary | ICD-10-CM | POA: Diagnosis not present

## 2023-12-09 DIAGNOSIS — Z89511 Acquired absence of right leg below knee: Secondary | ICD-10-CM | POA: Diagnosis not present

## 2023-12-09 DIAGNOSIS — D631 Anemia in chronic kidney disease: Secondary | ICD-10-CM | POA: Insufficient documentation

## 2023-12-09 DIAGNOSIS — I480 Paroxysmal atrial fibrillation: Secondary | ICD-10-CM | POA: Insufficient documentation

## 2023-12-09 DIAGNOSIS — Z79899 Other long term (current) drug therapy: Secondary | ICD-10-CM | POA: Diagnosis not present

## 2023-12-09 DIAGNOSIS — I959 Hypotension, unspecified: Secondary | ICD-10-CM | POA: Insufficient documentation

## 2023-12-09 DIAGNOSIS — I5082 Biventricular heart failure: Secondary | ICD-10-CM | POA: Diagnosis not present

## 2023-12-09 DIAGNOSIS — I252 Old myocardial infarction: Secondary | ICD-10-CM | POA: Diagnosis not present

## 2023-12-09 DIAGNOSIS — I132 Hypertensive heart and chronic kidney disease with heart failure and with stage 5 chronic kidney disease, or end stage renal disease: Secondary | ICD-10-CM | POA: Diagnosis not present

## 2023-12-09 DIAGNOSIS — Z7901 Long term (current) use of anticoagulants: Secondary | ICD-10-CM | POA: Diagnosis not present

## 2023-12-09 DIAGNOSIS — N186 End stage renal disease: Secondary | ICD-10-CM | POA: Insufficient documentation

## 2023-12-09 DIAGNOSIS — I4510 Unspecified right bundle-branch block: Secondary | ICD-10-CM | POA: Insufficient documentation

## 2023-12-09 DIAGNOSIS — N184 Chronic kidney disease, stage 4 (severe): Secondary | ICD-10-CM

## 2023-12-09 DIAGNOSIS — Z794 Long term (current) use of insulin: Secondary | ICD-10-CM | POA: Insufficient documentation

## 2023-12-09 DIAGNOSIS — R000 Tachycardia, unspecified: Secondary | ICD-10-CM | POA: Diagnosis not present

## 2023-12-09 DIAGNOSIS — Z87891 Personal history of nicotine dependence: Secondary | ICD-10-CM | POA: Insufficient documentation

## 2023-12-09 DIAGNOSIS — Z9981 Dependence on supplemental oxygen: Secondary | ICD-10-CM | POA: Insufficient documentation

## 2023-12-09 DIAGNOSIS — E1122 Type 2 diabetes mellitus with diabetic chronic kidney disease: Secondary | ICD-10-CM | POA: Insufficient documentation

## 2023-12-09 DIAGNOSIS — K851 Biliary acute pancreatitis without necrosis or infection: Secondary | ICD-10-CM | POA: Insufficient documentation

## 2023-12-09 DIAGNOSIS — I214 Non-ST elevation (NSTEMI) myocardial infarction: Secondary | ICD-10-CM | POA: Diagnosis not present

## 2023-12-09 DIAGNOSIS — I120 Hypertensive chronic kidney disease with stage 5 chronic kidney disease or end stage renal disease: Secondary | ICD-10-CM | POA: Insufficient documentation

## 2023-12-09 DIAGNOSIS — Z79890 Hormone replacement therapy (postmenopausal): Secondary | ICD-10-CM | POA: Insufficient documentation

## 2023-12-09 DIAGNOSIS — Z993 Dependence on wheelchair: Secondary | ICD-10-CM | POA: Insufficient documentation

## 2023-12-09 DIAGNOSIS — Z992 Dependence on renal dialysis: Secondary | ICD-10-CM | POA: Diagnosis not present

## 2023-12-09 DIAGNOSIS — Z01818 Encounter for other preprocedural examination: Secondary | ICD-10-CM | POA: Diagnosis not present

## 2023-12-09 DIAGNOSIS — I5022 Chronic systolic (congestive) heart failure: Secondary | ICD-10-CM

## 2023-12-09 DIAGNOSIS — R002 Palpitations: Secondary | ICD-10-CM | POA: Insufficient documentation

## 2023-12-09 DIAGNOSIS — Z955 Presence of coronary angioplasty implant and graft: Secondary | ICD-10-CM | POA: Insufficient documentation

## 2023-12-09 NOTE — Patient Instructions (Addendum)
 Thank you for coming in today  If you had labs drawn today, any labs that are abnormal the clinic will call you No news is good news  Medications: No change  Follow up appointments:  Your physician recommends that you schedule a follow-up appointment in:  3 months with With Dr. Cherrie Please call our office to schedule the follow-up appointment in August for October 2025.  Your physician has requested that you have an echocardiogram. Echocardiography is a painless test that uses sound waves to create images of your heart. It provides your doctor with information about the size and shape of your heart and how well your heart's chambers and valves are working. This procedure takes approximately one hour. There are no restrictions for this procedure.   .   Do the following things EVERYDAY: Weigh yourself in the morning before breakfast. Write it down and keep it in a log. Take your medicines as prescribed Eat low salt foods--Limit salt (sodium) to 2000 mg per day.  Stay as active as you can everyday Limit all fluids for the day to less than 2 liters   At the [facility], you and your health needs are our priority. As part of our continuing mission to provide you with exceptional heart care, we have created designated Provider Care Teams. These Care Teams include your primary Cardiologist (physician) and Advanced Practice Providers (APPs- Physician Assistants and Nurse Practitioners) who all work together to provide you with the care you need, when you need it.   You may see any of the following providers on your designated Care Team at your next follow up: Dr Toribio Cherrie Dr Ezra Shuck Dr. Ria Gardenia Greig Lenetta, NP Caffie Shed, [place] [facility] [place], [place] Beckey Coe, NP Tinnie Redman, PharmD   Please be sure to bring in all your medications bottles to every appointment.    Thank you for choosing [facility]  If you have any questions or concerns before your next appointment please send us  a message through [place] or call our office at [phone].    TO LEAVE A MESSAGE FOR THE NURSE SELECT OPTION 2, PLEASE LEAVE A MESSAGE INCLUDING: YOUR NAME DATE OF BIRTH CALL BACK NUMBER REASON FOR CALL**this is important as we prioritize the call backs  YOU WILL RECEIVE A CALL BACK THE SAME DAY AS LONG AS YOU CALL BEFORE 4:00 PM

## 2023-12-10 ENCOUNTER — Encounter (HOSPITAL_COMMUNITY): Payer: Self-pay

## 2023-12-11 ENCOUNTER — Ambulatory Visit: Admitting: Family Medicine

## 2023-12-11 ENCOUNTER — Ambulatory Visit: Payer: Self-pay

## 2023-12-11 ENCOUNTER — Encounter: Payer: Self-pay | Admitting: Family Medicine

## 2023-12-11 VITALS — BP 124/82 | HR 84 | Temp 98.1°F | Ht 65.0 in | Wt 231.0 lb

## 2023-12-11 DIAGNOSIS — R300 Dysuria: Secondary | ICD-10-CM

## 2023-12-11 DIAGNOSIS — N186 End stage renal disease: Secondary | ICD-10-CM

## 2023-12-11 MED ORDER — NITROFURANTOIN MONOHYD MACRO 100 MG PO CAPS
100.0000 mg | ORAL_CAPSULE | Freq: Every day | ORAL | 0 refills | Status: DC
Start: 2023-12-11 — End: 2023-12-25

## 2023-12-11 NOTE — Progress Notes (Signed)
 Patient Office Visit  Assessment & Plan:  Dysuria -     Urinalysis, Routine w reflex microscopic -     Urine Culture -     Nitrofurantoin  Monohyd Macro; Take 1 capsule (100 mg total) by mouth daily.  Dispense: 7 capsule; Refill: 0  ESRD (end stage renal disease) (HCC) -     Nitrofurantoin  Monohyd Macro; Take 1 capsule (100 mg total) by mouth daily.  Dispense: 7 capsule; Refill: 0   Assessment and Plan    Urinary Tract Infection (UTI) Recurrent UTIs with E. coli resistant to amoxicillin  and Augmentin , sensitive to Macrobid  and Cipro . Treatment adjusted due to dialysis and renal impairment.  - Prescribed Macrobid  once daily due to renal impairment. - Monitor for signs of sepsis, such as confusion. - Advised ER visit if symptoms worsen or confusion occurs. - Ensure prescription sent to pharmacy.  Chronic Kidney Disease on Dialysis On dialysis for nearly two years with significant post-dialysis nausea. Dry weight approximately 231 pounds.  Heart Failure Heart failure requires careful antibiotic selection to avoid exacerbation. - Avoid antibiotics that could exacerbate heart failure.  Nausea Persistent nausea, particularly post-dialysis. Managed with Zofran  as needed. - Continue Zofran  as needed for nausea.  Follow-up Follow-up with cardiologist scheduled for September. Managing multiple follow-up appointments post-hospitalization for severe infection earlier this year. - Attend cardiology follow-up in September.          No follow-ups on file.   Subjective:    Patient ID: Susan [name], female    DOB: [birth year]  Age: 73 y.o. MRN: [id number]  Chief Complaint  Patient presents with   Dysuria    X 2 days.    Dysuria    Discussed the use of AI scribe software for clinical note transcription with the patient, who gave verbal consent to proceed.  History of Present Illness   Susan [name] is a 73 year old female with chronic kidney disease on dialysis who  presents with symptoms of a urinary tract infection. patient unable to give us  a sample, had dialysis yesterday. Last time she went to ER in March Urine culture positive for E.Coli resistant to most oral meds except Cipro  and Macrobid .   She experiences dysuria, describing it as 'it burns when I pee' and 'I just dribble'. She denies fever but sometimes feels unwell. She recalls a previous urinary tract infection in March, where E. coli was identified as the causative agent, resistant to several antibiotics but sensitive to Macrobid  and Cipro . She does not recall the specific antibiotics she was given at that time but thinks she got IV antibiotics. She was in hospital in May with UTI and sepsis and stayed there for 8 days.   She has been on dialysis for almost two years and finds it challenging, stating 'I hate it every time I go'. Her dry weight has been stable at 231 pounds for the past couple of months. She experiences significant nausea after dialysis, lasting at least a couple of days, for which she takes Zofran  4 mg as needed.  In May, she was hospitalized for a severe infection, possibly a urinary tract infection, which required an eight-day stay. She does not recall if it was sepsis but mentions hospital was investigating a staph infection. She has a history of heart issues, including heart failure, and is cautious about medications that could affect her heart or kidneys.  She has difficulty providing urine samples due to her condition and has previously required catheterization for sample  collection. She is unable to provide a sample currently, especially after dialysis.      Susan [name] is a 73 year old female with chronic kidney disease on dialysis who presents with symptoms of a urinary tract infection. patient unable to give us  a sample, had dialysis yesterday. Last time she went to ER in March Urine culture positive for E.Coli resistant to most oral meds except Cipro  and Macrobid .   She  experiences dysuria, describing it as 'it burns when I pee' and 'I just dribble'. She denies fever but sometimes feels unwell. She recalls a previous urinary tract infection in March, where E. coli was identified as the causative agent, resistant to several antibiotics but sensitive to Macrobid  and Cipro . She does not recall the specific antibiotics she was given at that time but thinks she got IV antibiotics. She was in hospital in May with UTI and sepsis and stayed there for 8 days.   She has been on dialysis for almost two years and finds it challenging, stating 'I hate it every time I go'. Her dry weight has been stable at 231 pounds for the past couple of months. She experiences significant nausea after dialysis, lasting at least a couple of days, for which she takes Zofran  4 mg as needed.  In May, she was hospitalized for a severe infection, possibly a urinary tract infection, which required an eight-day stay. She does not recall if it was sepsis but mentions hospital was investigating a staph infection. She has a history of heart issues, including heart failure, and is cautious about medications that could affect her heart or kidneys.  She has difficulty providing urine samples due to her condition and has previously required catheterization for sample collection. She is unable to provide a sample currently, especially after dialysis. Physical Exam MEASUREMENTS: Weight- 231. ABDOMEN: Abdomen non-tender Results Urine culture: E. coli resistant to amoxicillin , Augmentin ; sensitive to ciprofloxacin , nitrofurantoin  (07/2023) Assessment & Plan Urinary Tract Infection (UTI) Recurrent UTIs with E. coli resistant to amoxicillin  and Augmentin , sensitive to Macrobid  and Cipro . Treatment adjusted due to dialysis and renal impairment.  - Prescribed Macrobid  once daily due to renal impairment. - Monitor for signs of sepsis, such as confusion and patient/husband will go to ED - Advised ER visit if symptoms  worsen or confusion occurs. - Ensure prescription sent to pharmacy.  Chronic Kidney Disease on Dialysis On dialysis for nearly two years with significant post-dialysis nausea. Dry weight approximately 231 pounds.  Heart Failure Heart failure requires careful antibiotic selection to avoid exacerbation. - Avoid antibiotics that could exacerbate heart failure.  Nausea Persistent nausea, particularly post-dialysis. Managed with Zofran  as needed. - Continue Zofran  as needed for nausea.  Follow-up Follow-up with cardiologist scheduled for September. Managing multiple follow-up appointments post-hospitalization for severe infection earlier this year. - Attend cardiology follow-up in September.   The ASCVD Risk score (Arnett DK, et al., 2019) failed to calculate for the following reasons:   Risk score cannot be calculated because patient has a medical history suggesting prior/existing ASCVD  Past Medical History:  Diagnosis Date   Allergy    Anemia    hx   Anxiety    Arthritis    generalized (03/15/2014)   CAD (coronary artery disease)    MI in 2000 - MI  2007 - treated bare metal stent (no nuclear since then as 9/11)   Carotid artery disease (HCC)    CHF (congestive heart failure) (HCC)    Chronic diastolic heart failure (HCC)  a) ECHO (08/2013) EF 55-60% and RV function nl b) RHC (08/2013) RA 4, RV 30/5/7, PA 25/10 (16), PCWP 7, Fick CO/CI 6.3/2.7, PVR 1.5 WU, PA 61 and 66%   Clotting disorder (HCC)    Daily headache    ~ every other day; since I fell in June (03/15/2014)   Depression    Diabetic retinopathy (HCC)    Dyslipidemia    ESRD (end stage renal disease) (HCC)    Dialysis on Tues Thurs Sat   Exertional shortness of breath    GERD (gastroesophageal reflux disease)    History of blood transfusion    History of kidney stones    HTN (hypertension)    Hypothyroidism    Myocardial infarction (HCC)    Obesity    Osteoarthritis    Oxygen  deficiency    PAF  (paroxysmal atrial fibrillation) (HCC)    Peripheral neuropathy    bilateral feet/hands   PONV (postoperative nausea and vomiting)    RBBB (right bundle branch block)    Old   Stroke (HCC)    mini strokes   Type II diabetes mellitus (HCC)    Type II, [place] libre left upper arm. patient has omnipod insulin  pump with Novolin R Insulin    Past Surgical History:  Procedure Laterality Date   A/V FISTULAGRAM Left 11/07/2022   Procedure: A/V Fistulagram;  Surgeon: Magda Debby SAILOR, MD;  [facility];  Service: Cardiovascular;  Laterality: Left;   ABDOMINAL HYSTERECTOMY  1980's   AMPUTATION Right 02/24/2018   Procedure: RIGHT FOOT GREAT TOE AND 2ND TOE AMPUTATION;  Surgeon: Harden Jerona GAILS, MD;  Location: [facility] OR;  Service: Orthopedics;  Laterality: Right;   AMPUTATION Right 04/30/2018   Procedure: RIGHT TRANSMETATARSAL AMPUTATION;  Surgeon: Harden Jerona GAILS, MD;  Location: [facility] OR;  Service: Orthopedics;  Laterality: Right;   AMPUTATION Right 05/02/2022   Procedure: RIGHT BELOW KNEE AMPUTATION;  Surgeon: Harden Jerona GAILS, MD;  Location[facility];  Service: Orthopedics;  Laterality: Right;   APPLICATION OF WOUND VAC Right 06/13/2022   Procedure: APPLICATION OF WOUND VAC;  Surgeon: Harden Jerona GAILS, MD;  [facility] OR;  Service: Orthopedics;  Laterality: Right;   APPLICATION OF WOUND VAC Left 11/14/2022   Procedure: APPLICATION OF WOUND VAC;  Surgeon: Harden Jerona GAILS, MD;  [facility];  Service: Orthopedics;  Laterality: Left;   AV FISTULA PLACEMENT Left 04/02/2022   Procedure: LEFT ARM ARTERIOVENOUS (AV) FISTULA CREATION;  Surgeon: Serene Gaile ORN, MD;  [facility]: [facility];  Service: Vascular;  Laterality: Left;  PERIPHERAL NERVE BLOCK   AV FISTULA PLACEMENT Right 04/27/2023   Procedure: RIGHT ARM BRACHIOBASILIC ARTERIOVENOUS (AV) FISTULA CREATION;  Surgeon: Magda Debby SAILOR, MD;  [facility]: [facility];  Service: Vascular;  Laterality: Right;   BASCILIC VEIN TRANSPOSITION Left 07/31/2022   Procedure:  LEFT ARM SECOND STAGE BASILIC VEIN TRANSPOSITION;  Surgeon: Serene Gaile ORN, MD;  [facility]: [facility];  Service: Vascular;  Laterality: Left;   BIOPSY  05/27/2020   Procedure: BIOPSY;  Surgeon: Cindie Carlin POUR, DO;  [facility];  Service: Endoscopy;;   CATARACT EXTRACTION, BILATERAL Bilateral ?2013   COLONOSCOPY W/ POLYPECTOMY     COLONOSCOPY WITH PROPOFOL  N/A 03/13/2019   Procedure: COLONOSCOPY WITH PROPOFOL ;  Surgeon: Albertus Gordy HERO, MD;  Location: [facility] ENDOSCOPY;  Service: Gastroenterology;  Laterality: N/A;   CORONARY ANGIOPLASTY WITH STENT PLACEMENT  1999; 2007   1 + 1   ERCP N/A 02/03/2022   Procedure: ENDOSCOPIC RETROGRADE CHOLANGIOPANCREATOGRAPHY (  ERCP);  Surgeon: Rollin Dover, MD;  Location: [facility] ENDOSCOPY;  Service: Gastroenterology;  Laterality: N/A;   ESOPHAGOGASTRODUODENOSCOPY N/A 02/12/2023   Procedure: ESOPHAGOGASTRODUODENOSCOPY (EGD);  Surgeon: Rollin Dover, MD;  Location: [facility] ENDOSCOPY;  Service: Gastroenterology;  Laterality: N/A;   ESOPHAGOGASTRODUODENOSCOPY (EGD) WITH PROPOFOL  N/A 03/13/2019   Procedure: ESOPHAGOGASTRODUODENOSCOPY (EGD) WITH PROPOFOL ;  Surgeon: Albertus Gordy HERO, MD;  Location: [facility] ENDOSCOPY;  Service: Gastroenterology;  Laterality: N/A;   ESOPHAGOGASTRODUODENOSCOPY (EGD) WITH PROPOFOL  N/A 05/27/2020   Procedure: ESOPHAGOGASTRODUODENOSCOPY (EGD) WITH PROPOFOL ;  Surgeon: Cindie Carlin POUR, DO;  [facility];  Service: Endoscopy;  Laterality: N/A;   ESOPHAGOGASTRODUODENOSCOPY (EGD) WITH PROPOFOL  N/A 09/03/2022   Procedure: ESOPHAGOGASTRODUODENOSCOPY (EGD) WITH PROPOFOL ;  Surgeon: Rollin Dover, MD;  Location: [facility] ENDOSCOPY;  Service: Gastroenterology;  Laterality: N/A;   EYE SURGERY Bilateral    lazer   FLEXIBLE SIGMOIDOSCOPY N/A 05/23/2022   Procedure: FLEXIBLE SIGMOIDOSCOPY;  Surgeon: Rollin Dover, MD;  Location: [facility] ENDOSCOPY;  Service: Gastroenterology;  Laterality: N/A;   FLEXIBLE SIGMOIDOSCOPY N/A 05/24/2022   Procedure: FLEXIBLE SIGMOIDOSCOPY;   Surgeon: Federico Rosario BROCKS, MD;  Location: [facility] ENDOSCOPY;  Service: Gastroenterology;  Laterality: N/A;   FLEXIBLE SIGMOIDOSCOPY N/A 09/03/2022   Procedure: FLEXIBLE SIGMOIDOSCOPY;  Surgeon: Rollin Dover, MD;  Location: [facility] ENDOSCOPY;  Service: Gastroenterology;  Laterality: N/A;   HEMOSTASIS CLIP PLACEMENT  03/13/2019   Procedure: HEMOSTASIS CLIP PLACEMENT;  Surgeon: Albertus Gordy HERO, MD;  Location: [facility] ENDOSCOPY;  Service: Gastroenterology;;   HEMOSTASIS CLIP PLACEMENT  05/23/2022   Procedure: HEMOSTASIS CLIP PLACEMENT;  Surgeon: Rollin Dover, MD;  Location: [facility] ENDOSCOPY;  Service: Gastroenterology;;   HEMOSTASIS CONTROL  05/24/2022   Procedure: HEMOSTASIS CONTROL;  Surgeon: Federico Rosario BROCKS, MD;  Location: [facility] ENDOSCOPY;  Service: Gastroenterology;;   HOT HEMOSTASIS N/A 05/23/2022   Procedure: HOT HEMOSTASIS (ARGON PLASMA COAGULATION/BICAP);  Surgeon: Rollin Dover, MD;  Location: [facility] ENDOSCOPY;  Service: Gastroenterology;  Laterality: N/A;   I & D EXTREMITY Left 05/05/2022   Procedure: IRRIGATION AND DEBRIDEMENT LEFT ARM AV FISTULA;  Surgeon: Gretta Lonni PARAS, MD;  Location: [facility] OR;  Service: Vascular;  Laterality: Left;   I & D EXTREMITY N/A 11/14/2022   Procedure: IRRIGATION AND DEBRIDEMENT OF LOWER EXTREMITY WOUND;  Surgeon: Harden Jerona GAILS, MD;  [facility];  Service: Orthopedics;  Laterality: N/A;   INSERTION OF DIALYSIS CATHETER Right 04/02/2022   Procedure: INSERTION OF TUNNELED DIALYSIS CATHETER;  Surgeon: Serene Gaile ORN, MD;  [facility];  Service: Vascular;  Laterality: Right;   IR FLUORO GUIDE CV LINE RIGHT  08/10/2023   IR VENOCAVAGRAM IVC  08/10/2023   KNEE ARTHROSCOPY Left 10/25/2006   POLYPECTOMY  03/13/2019   Procedure: POLYPECTOMY;  Surgeon: Albertus Gordy HERO, MD;  Location: [facility] ENDOSCOPY;  Service: Gastroenterology;;   REMOVAL OF STONES  02/03/2022   Procedure: REMOVAL OF STONES;  Surgeon: Rollin Dover, MD;  Location: [facility] ENDOSCOPY;  Service: Gastroenterology;;   REVISON OF  ARTERIOVENOUS FISTULA Left 08/20/2022   Procedure: REVISON OF LEFT ARM ARTERIOVENOUS FISTULA;  Surgeon: Serene Gaile ORN, MD;  [facility];  Service: Vascular;  Laterality: Left;   RIGHT HEART CATH N/A 07/24/2017   Procedure: RIGHT HEART CATH;  Surgeon: Cherrie Toribio SAUNDERS, MD;  [facility];  Service: Cardiovascular;  Laterality: N/A;   RIGHT HEART CATHETERIZATION N/A 09/22/2013   Procedure: RIGHT HEART CATH;  Surgeon: Toribio SAUNDERS Cherrie, MD;  Location: [facility] CATH LAB;  Service: Cardiovascular;  Laterality: N/A;   SHOULDER ARTHROSCOPY WITH OPEN ROTATOR CUFF  REPAIR Right 03/14/2014   Procedure: RIGHT SHOULDER ARTHROSCOPY WITH BICEPS RELEASE, OPEN SUBSCAPULA REPAIR, OPEN SUPRASPINATUS REPAIR.;  Surgeon: Cordella Glendia Hutchinson, MD;  Location: [facility] OR;  Service: Orthopedics;  Laterality: Right;   SPHINCTEROTOMY  02/03/2022   Procedure: SPHINCTEROTOMY;  Surgeon: Rollin Dover, MD;  Location: [facility] ENDOSCOPY;  Service: Gastroenterology;;   STUMP REVISION Right 06/13/2022   Procedure: REVISION RIGHT BELOW KNEE AMPUTATION;  Surgeon: Harden Jerona GAILS, MD;  Location: [facility] OR;  Service: Orthopedics;  Laterality: Right;   TEE WITHOUT CARDIOVERSION N/A 02/04/2022   Procedure: TRANSESOPHAGEAL ECHOCARDIOGRAM (TEE);  Surgeon: Cherrie Toribio SAUNDERS, MD;  Location: [facility] ENDOSCOPY;  Service: Cardiovascular;  Laterality: N/A;   THROMBECTOMY W/ EMBOLECTOMY Left 08/20/2022   Procedure: THROMBECTOMY OF LEFT ARM ARTERIOVENOUS FISTULA;  Surgeon: Serene Gaile ORN, MD;  Location: [facility] OR;  Service: Vascular;  Laterality: Left;   TOE AMPUTATION Right 02/24/2018   GREAT TOE AND 2ND TOE AMPUTATION   TUBAL LIGATION  1970's   UPPER EXTREMITY VENOGRAPHY N/A 07/31/2023   Procedure: UPPER EXTREMITY VENOGRAPHY;  Surgeon: Magda Debby SAILOR, MD;  [facility];  Service: Cardiovascular;  Laterality: N/A;   Social History   Tobacco Use   Smoking status: Former    Current packs/day: 0.00    Average packs/day: 3.0 packs/day  for 32.0 years (96.0 ttl pk-yrs)    Types: Cigarettes    Start date: 10/24/1965    Quit date: 10/24/1997    Years since quitting: 26.1   Smokeless tobacco: Never  Vaping Use   Vaping status: Never Used  Substance Use Topics   Alcohol use: Not Currently   Drug use: No   Family History  Problem Relation Age of Onset   Heart attack Mother 20   Allergies  Allergen Reactions   Cephalexin  Diarrhea and Other (See Comments)   Codeine Nausea And Vomiting and Other (See Comments)    Review of Systems  Genitourinary:  Positive for dysuria.      Objective:    BP 124/82   Pulse 84   Temp 98.1 F (36.7 C)   Ht 5' 5 (1.651 m)   Wt 231 lb (104.8 kg) Comment: as stated by patient.  SpO2 97%   BMI 38.44 kg/m  BP Readings from Last 3 Encounters:  12/11/23 124/82  12/09/23 92/60  10/26/23 (!) 102/56   Wt Readings from Last 3 Encounters:  12/11/23 231 lb (104.8 kg)  12/09/23 231 lb (104.8 kg)  10/26/23 168 lb (76.2 kg)    Physical Exam Vitals and nursing note reviewed.  Constitutional:      General: She is not in acute distress.    Appearance: Normal appearance.     Comments: Comes in wheelchair with her husband.   HENT:     Head: Normocephalic.  Eyes:     Extraocular Movements: Extraocular movements intact.     Pupils: Pupils are equal, round, and reactive to light.  Cardiovascular:     Rate and Rhythm: Normal rate and regular rhythm.     Heart sounds: Normal heart sounds.  Pulmonary:     Effort: Pulmonary effort is normal.     Breath sounds: Normal breath sounds.  Abdominal:     Tenderness: There is no abdominal tenderness. There is no guarding.  Neurological:     General: No focal deficit present.     Mental Status: She is alert and oriented to person, place, and time.  Psychiatric:        Mood and Affect: Mood  normal.        Behavior: Behavior normal.      No results found for any visits on 12/11/23.

## 2023-12-11 NOTE — Telephone Encounter (Signed)
 FYI Only or Action Required?: Action required by provider: clinical question for provider and patient requesting recommendations from provider.  Patient was last seen in primary care on 10/26/2023 by Duanne Butler DASEN, MD.  Called Nurse Triage reporting Dysuria and Dizziness.  Symptoms began Wednesday.  Interventions attempted: Rest, hydration, or home remedies.  Symptoms are: unchanged.  Triage Disposition: See Physician Within 24 Hours-patient would like a phone call back today.  Patient/caregiver understands and will follow disposition?:   Copied from CRM [id number]. Topic: Clinical - Red Word Triage >> Dec 11, 2023  9:53 AM Tobias CROME wrote: Red Word that prompted transfer to Nurse Triage: Patient has a UTI, tongue tied, dizzy, burning sensation when urinating. Reason for Disposition  Urinating more frequently than usual (i.e., frequency) OR new-onset of the feeling of an urgent need to urinate (i.e., urgency)  [1] MODERATE dizziness (e.g., interferes with normal activities) AND [2] has NOT been evaluated by doctor (or NP/PA) for this  (Exception: Dizziness caused by heat exposure, sudden standing, or poor fluid intake.)  Answer Assessment - Initial Assessment Questions 1. DESCRIPTION: Describe your dizziness.     Patient reports feeling lightheaded and feeling woozy. Patient reports being tongue tied.  2. LIGHTHEADED: Do you feel lightheaded? (e.g., somewhat faint, woozy, weak upon standing)     yes 3. VERTIGO: Do you feel like either you or the room is spinning or tilting? (i.e., vertigo)     no 4. SEVERITY: How bad is it?  Do you feel like you are going to faint? Can you stand and walk?     moderate 5. ONSET:  When did the dizziness begin?     Started Wednesday-no issue finishing dialysis yesterday 6. AGGRAVATING FACTORS: Does anything make it worse? (e.g., standing, change in head position)     From sitting to standing 7. HEART RATE: Can you tell me your heart  rate? How many beats in 15 seconds?  (Note: Not all patients can do this.)       N/A 8. CAUSE: What do you think is causing the dizziness? (e.g., decreased fluids or food, diarrhea, emotional distress, heat exposure, new medicine, sudden standing, vomiting; unknown)     Reports this happens when she gets a UTI 9. RECURRENT SYMPTOM: Have you had dizziness before? If Yes, ask: When was the last time? What happened that time?     yes 10. OTHER SYMPTOMS: Do you have any other symptoms? (e.g., fever, chest pain, vomiting, diarrhea, bleeding)       no  Answer Assessment - Initial Assessment Questions 1. SYMPTOM: What's the main symptom you're concerned about? (e.g., frequency, incontinence)     Burning with urination 2. ONSET: When did the  burning with urination  start?     Wednesday 3. PAIN: Is there any pain? If Yes, ask: How bad is it? (Scale: 1-10; mild, moderate, severe)     severe 4. CAUSE: What do you think is causing the symptoms?     Probable UTI 5. OTHER SYMPTOMS: Do you have any other symptoms? (e.g., blood in urine, fever, flank pain, pain with urination)     No  Patient reports burning with urination when she does go. Patient states both burning with urination and dizziness started on Wednesday. Acute appointment this afternoon was offered to patient but states she is unable to get to the office. Patient would like a call back with recommendations.  Protocols used: Dizziness - Lightheadedness-A-AH, Urinary Symptoms-A-AH

## 2023-12-11 NOTE — Telephone Encounter (Signed)
 Patient states no changes in symptoms. She is calling back in and states she has not heard back from Dr Clara nurse and she would like to know if antibiotics can be sent in for her. Advised patient for UTI symptoms and dizziness, previous triage RN recommended disposition was to see physician within 24 hours. Patient agreeable to acute visit today with Dr Aletha.

## 2023-12-13 ENCOUNTER — Other Ambulatory Visit: Payer: Self-pay | Admitting: Family Medicine

## 2023-12-14 ENCOUNTER — Other Ambulatory Visit: Payer: Self-pay | Admitting: Family Medicine

## 2023-12-14 ENCOUNTER — Ambulatory Visit (HOSPITAL_COMMUNITY): Payer: Self-pay | Admitting: Family Medicine

## 2023-12-14 ENCOUNTER — Ambulatory Visit: Payer: Self-pay

## 2023-12-14 MED ORDER — SULFAMETHOXAZOLE-TRIMETHOPRIM 800-160 MG PO TABS
1.0000 | ORAL_TABLET | Freq: Two times a day (BID) | ORAL | 0 refills | Status: DC
Start: 2023-12-14 — End: 2023-12-25

## 2023-12-14 NOTE — Telephone Encounter (Signed)
 FYI Only or Action Required?: Action required by provider: update on patient condition.  Patient was last seen in primary care on 12/11/2023 by Susan Bene, MD.  Called Nurse Triage reporting Urinary Tract Infection and Diarrhea.  Symptoms began several days ago.  Interventions attempted: OTC medications: Imodium  AD, Kaopectate.  Symptoms are: mild diarrhea, severe abdominal crampsunchanged.  Triage Disposition: Call PCP Within 24 Hours  Patient/caregiver understands and will follow disposition?: Yes            Copied from CRM [id number]. Topic: Clinical - Red Word Triage >> Dec 14, 2023  9:45 AM Gustabo D wrote: Patient came in to see doctor last Friday and was given a antibiotic and has had diarrhea since with light cramps. Reason for Disposition  Patient wants to stop the antibiotic  [1] Reasonable improvement on antibiotics AND [2] no fever  Answer Assessment - Initial Assessment Questions Patient states she can not continue to take Macrobid  (she states she has 4 pills left) due to GI symptoms (diarrhea, abdominal pain) and asking what she needs to do. Patient states she is drinking plenty of water  to stay hydrated with the diarrhea. Patient states she also just took Imodium  AD and last night took Kaopectate. She states that has not slowed down the diarrhea at all.  1. MAIN SYMPTOM: What is the main symptom you are concerned about? (e.g., painful urination, urine frequency)     Patient states her urinary symptoms have improved, however, main concern is diarrhea (2-3 episodes daily) and light abdominal cramps started Saturday or Sunday.  2. BETTER-SAME-WORSE: Are you getting better, staying the same, or getting worse compared to how you felt at your last visit to the doctor (most recent medical visit)?     She states UTI symptoms have improved but diarrhea is new.  3. PAIN: How bad is the pain?  (e.g., Scale 1-10; mild, moderate, or severe)     8-9/10 , abdominal  cramps.  4. FEVER: Do you have a fever? If Yes, ask: What is it, how was it measured, and when did it start?     No.  5. OTHER SYMPTOMS: Do you have any other symptoms? (e.g., blood in the urine, flank pain, vaginal discharge)     Patient denies nausea, vomiting.  6. DIAGNOSIS: When was the UTI diagnosed? By whom? Was it a kidney infection, bladder infection or both?     12/11/23, Dr Susan.   7. ANTIBIOTIC: What antibiotic(s) are you taking? How many times per day?     Macrobid , once daily. Last dose this morning.  8. ANTIBIOTIC - START DATE: When did you start taking the antibiotic?     I'm really not sure, the 18th or 19th.  Protocols used: Urinary Tract Infection on Antibiotic Follow-up Call - Female-A-AH, Diarrhea on Antibiotics-A-AH

## 2023-12-15 DIAGNOSIS — D689 Coagulation defect, unspecified: Secondary | ICD-10-CM | POA: Diagnosis not present

## 2023-12-15 DIAGNOSIS — N186 End stage renal disease: Secondary | ICD-10-CM | POA: Diagnosis not present

## 2023-12-15 DIAGNOSIS — N2581 Secondary hyperparathyroidism of renal origin: Secondary | ICD-10-CM | POA: Diagnosis not present

## 2023-12-15 DIAGNOSIS — Z992 Dependence on renal dialysis: Secondary | ICD-10-CM | POA: Diagnosis not present

## 2023-12-15 DIAGNOSIS — D509 Iron deficiency anemia, unspecified: Secondary | ICD-10-CM | POA: Diagnosis not present

## 2023-12-15 NOTE — Telephone Encounter (Signed)
 Requested medications are due for refill today.  unsure  Requested medications are on the active medications list.  yes  Last refill. 04/08/2023  Future visit scheduled.   no  Notes to clinic.  Medication is historical.    Requested Prescriptions  Pending Prescriptions Disp Refills   folic acid  (FOLVITE ) 1 MG tablet [Pharmacy Med Name: FOLIC ACID  1 MG TABLET] 90 tablet 1    Sig: TAKE 1 TABLET BY MOUTH EVERY DAY     Endocrinology:  Vitamins Passed - 12/15/2023  3:52 PM      Passed - Valid encounter within last 12 months    Recent Outpatient Visits           4 days ago Dysuria   [place] [facility] Aletha Bene, MD   1 month ago Strain of neck muscle, initial encounter   [place] [facility] Duanne Butler DASEN, MD   3 months ago ESRF (end stage renal failure) [facility])   [place] [facility] Duanne Butler DASEN, MD   5 months ago Pneumonia of right lower lobe due to infectious organism   [place] [facility] Duanne Butler DASEN, MD   6 months ago Acute bronchitis, unspecified organism   [place] [facility] Medicine Aletha Bene, MD       Future Appointments             In 1 month Branch, Dorn FALCON, MD [facility] at [facility]

## 2023-12-16 DIAGNOSIS — I2721 Secondary pulmonary arterial hypertension: Secondary | ICD-10-CM | POA: Diagnosis not present

## 2023-12-16 DIAGNOSIS — I5022 Chronic systolic (congestive) heart failure: Secondary | ICD-10-CM | POA: Diagnosis not present

## 2023-12-17 ENCOUNTER — Emergency Department (HOSPITAL_COMMUNITY)

## 2023-12-17 ENCOUNTER — Other Ambulatory Visit: Payer: Self-pay

## 2023-12-17 ENCOUNTER — Inpatient Hospital Stay (HOSPITAL_COMMUNITY)
Admission: EM | Admit: 2023-12-17 | Discharge: 2023-12-25 | DRG: 871 | Disposition: A | Discharge status: Home Health | Attending: Internal Medicine | Admitting: Internal Medicine

## 2023-12-17 ENCOUNTER — Encounter (HOSPITAL_COMMUNITY): Payer: Self-pay

## 2023-12-17 DIAGNOSIS — Z90710 Acquired absence of both cervix and uterus: Secondary | ICD-10-CM

## 2023-12-17 DIAGNOSIS — G9340 Encephalopathy, unspecified: Secondary | ICD-10-CM | POA: Diagnosis not present

## 2023-12-17 DIAGNOSIS — I132 Hypertensive heart and chronic kidney disease with heart failure and with stage 5 chronic kidney disease, or end stage renal disease: Secondary | ICD-10-CM | POA: Diagnosis present

## 2023-12-17 DIAGNOSIS — E66811 Obesity, class 1: Secondary | ICD-10-CM | POA: Diagnosis present

## 2023-12-17 DIAGNOSIS — E039 Hypothyroidism, unspecified: Secondary | ICD-10-CM | POA: Diagnosis not present

## 2023-12-17 DIAGNOSIS — Z8249 Family history of ischemic heart disease and other diseases of the circulatory system: Secondary | ICD-10-CM

## 2023-12-17 DIAGNOSIS — Z955 Presence of coronary angioplasty implant and graft: Secondary | ICD-10-CM

## 2023-12-17 DIAGNOSIS — A419 Sepsis, unspecified organism: Principal | ICD-10-CM | POA: Diagnosis present

## 2023-12-17 DIAGNOSIS — Z1152 Encounter for screening for COVID-19: Secondary | ICD-10-CM

## 2023-12-17 DIAGNOSIS — R251 Tremor, unspecified: Secondary | ICD-10-CM | POA: Diagnosis not present

## 2023-12-17 DIAGNOSIS — R6521 Severe sepsis with septic shock: Secondary | ICD-10-CM | POA: Diagnosis not present

## 2023-12-17 DIAGNOSIS — E11319 Type 2 diabetes mellitus with unspecified diabetic retinopathy without macular edema: Secondary | ICD-10-CM | POA: Diagnosis not present

## 2023-12-17 DIAGNOSIS — Z79899 Other long term (current) drug therapy: Secondary | ICD-10-CM

## 2023-12-17 DIAGNOSIS — M6281 Muscle weakness (generalized): Secondary | ICD-10-CM | POA: Diagnosis present

## 2023-12-17 DIAGNOSIS — Z7401 Bed confinement status: Secondary | ICD-10-CM | POA: Diagnosis not present

## 2023-12-17 DIAGNOSIS — N2581 Secondary hyperparathyroidism of renal origin: Secondary | ICD-10-CM | POA: Diagnosis present

## 2023-12-17 DIAGNOSIS — I480 Paroxysmal atrial fibrillation: Secondary | ICD-10-CM | POA: Diagnosis not present

## 2023-12-17 DIAGNOSIS — R6520 Severe sepsis without septic shock: Secondary | ICD-10-CM | POA: Diagnosis not present

## 2023-12-17 DIAGNOSIS — Z87891 Personal history of nicotine dependence: Secondary | ICD-10-CM

## 2023-12-17 DIAGNOSIS — E872 Acidosis, unspecified: Secondary | ICD-10-CM | POA: Diagnosis present

## 2023-12-17 DIAGNOSIS — Z8673 Personal history of transient ischemic attack (TIA), and cerebral infarction without residual deficits: Secondary | ICD-10-CM

## 2023-12-17 DIAGNOSIS — I5020 Unspecified systolic (congestive) heart failure: Secondary | ICD-10-CM | POA: Diagnosis not present

## 2023-12-17 DIAGNOSIS — Z9841 Cataract extraction status, right eye: Secondary | ICD-10-CM

## 2023-12-17 DIAGNOSIS — Z992 Dependence on renal dialysis: Secondary | ICD-10-CM | POA: Diagnosis not present

## 2023-12-17 DIAGNOSIS — E1142 Type 2 diabetes mellitus with diabetic polyneuropathy: Secondary | ICD-10-CM | POA: Diagnosis not present

## 2023-12-17 DIAGNOSIS — Z452 Encounter for adjustment and management of vascular access device: Secondary | ICD-10-CM | POA: Diagnosis not present

## 2023-12-17 DIAGNOSIS — Z604 Social exclusion and rejection: Secondary | ICD-10-CM | POA: Diagnosis present

## 2023-12-17 DIAGNOSIS — I4891 Unspecified atrial fibrillation: Secondary | ICD-10-CM | POA: Diagnosis not present

## 2023-12-17 DIAGNOSIS — Z66 Do not resuscitate: Secondary | ICD-10-CM | POA: Diagnosis not present

## 2023-12-17 DIAGNOSIS — Z794 Long term (current) use of insulin: Secondary | ICD-10-CM

## 2023-12-17 DIAGNOSIS — N250 Renal osteodystrophy: Secondary | ICD-10-CM | POA: Diagnosis not present

## 2023-12-17 DIAGNOSIS — Z7901 Long term (current) use of anticoagulants: Secondary | ICD-10-CM

## 2023-12-17 DIAGNOSIS — Z888 Allergy status to other drugs, medicaments and biological substances status: Secondary | ICD-10-CM

## 2023-12-17 DIAGNOSIS — I872 Venous insufficiency (chronic) (peripheral): Secondary | ICD-10-CM | POA: Diagnosis present

## 2023-12-17 DIAGNOSIS — M25511 Pain in right shoulder: Secondary | ICD-10-CM | POA: Diagnosis not present

## 2023-12-17 DIAGNOSIS — D631 Anemia in chronic kidney disease: Secondary | ICD-10-CM | POA: Diagnosis not present

## 2023-12-17 DIAGNOSIS — I959 Hypotension, unspecified: Secondary | ICD-10-CM | POA: Diagnosis not present

## 2023-12-17 DIAGNOSIS — Z1611 Resistance to penicillins: Secondary | ICD-10-CM | POA: Diagnosis not present

## 2023-12-17 DIAGNOSIS — R531 Weakness: Secondary | ICD-10-CM | POA: Diagnosis not present

## 2023-12-17 DIAGNOSIS — E1165 Type 2 diabetes mellitus with hyperglycemia: Secondary | ICD-10-CM | POA: Diagnosis present

## 2023-12-17 DIAGNOSIS — L89150 Pressure ulcer of sacral region, unstageable: Secondary | ICD-10-CM | POA: Diagnosis not present

## 2023-12-17 DIAGNOSIS — K8020 Calculus of gallbladder without cholecystitis without obstruction: Secondary | ICD-10-CM | POA: Diagnosis not present

## 2023-12-17 DIAGNOSIS — I10 Essential (primary) hypertension: Secondary | ICD-10-CM | POA: Diagnosis present

## 2023-12-17 DIAGNOSIS — E1122 Type 2 diabetes mellitus with diabetic chronic kidney disease: Secondary | ICD-10-CM | POA: Diagnosis present

## 2023-12-17 DIAGNOSIS — N186 End stage renal disease: Secondary | ICD-10-CM | POA: Diagnosis not present

## 2023-12-17 DIAGNOSIS — R410 Disorientation, unspecified: Secondary | ICD-10-CM | POA: Diagnosis not present

## 2023-12-17 DIAGNOSIS — Z89511 Acquired absence of right leg below knee: Secondary | ICD-10-CM

## 2023-12-17 DIAGNOSIS — E785 Hyperlipidemia, unspecified: Secondary | ICD-10-CM | POA: Diagnosis present

## 2023-12-17 DIAGNOSIS — D638 Anemia in other chronic diseases classified elsewhere: Secondary | ICD-10-CM | POA: Diagnosis present

## 2023-12-17 DIAGNOSIS — I87332 Chronic venous hypertension (idiopathic) with ulcer and inflammation of left lower extremity: Secondary | ICD-10-CM | POA: Diagnosis present

## 2023-12-17 DIAGNOSIS — I700 Atherosclerosis of aorta: Secondary | ICD-10-CM | POA: Diagnosis not present

## 2023-12-17 DIAGNOSIS — E871 Hypo-osmolality and hyponatremia: Secondary | ICD-10-CM | POA: Diagnosis present

## 2023-12-17 DIAGNOSIS — A4159 Other Gram-negative sepsis: Principal | ICD-10-CM | POA: Diagnosis present

## 2023-12-17 DIAGNOSIS — Z87442 Personal history of urinary calculi: Secondary | ICD-10-CM

## 2023-12-17 DIAGNOSIS — N390 Urinary tract infection, site not specified: Secondary | ICD-10-CM | POA: Diagnosis not present

## 2023-12-17 DIAGNOSIS — I5023 Acute on chronic systolic (congestive) heart failure: Secondary | ICD-10-CM | POA: Diagnosis present

## 2023-12-17 DIAGNOSIS — R5381 Other malaise: Secondary | ICD-10-CM | POA: Diagnosis present

## 2023-12-17 DIAGNOSIS — I120 Hypertensive chronic kidney disease with stage 5 chronic kidney disease or end stage renal disease: Secondary | ICD-10-CM | POA: Diagnosis not present

## 2023-12-17 DIAGNOSIS — G9341 Metabolic encephalopathy: Secondary | ICD-10-CM | POA: Diagnosis not present

## 2023-12-17 DIAGNOSIS — Z885 Allergy status to narcotic agent status: Secondary | ICD-10-CM

## 2023-12-17 DIAGNOSIS — D649 Anemia, unspecified: Secondary | ICD-10-CM | POA: Diagnosis not present

## 2023-12-17 DIAGNOSIS — I252 Old myocardial infarction: Secondary | ICD-10-CM

## 2023-12-17 DIAGNOSIS — N179 Acute kidney failure, unspecified: Secondary | ICD-10-CM | POA: Diagnosis not present

## 2023-12-17 DIAGNOSIS — G253 Myoclonus: Secondary | ICD-10-CM | POA: Diagnosis present

## 2023-12-17 DIAGNOSIS — Z79890 Hormone replacement therapy (postmenopausal): Secondary | ICD-10-CM

## 2023-12-17 DIAGNOSIS — I2510 Atherosclerotic heart disease of native coronary artery without angina pectoris: Secondary | ICD-10-CM | POA: Diagnosis present

## 2023-12-17 DIAGNOSIS — F419 Anxiety disorder, unspecified: Secondary | ICD-10-CM | POA: Diagnosis present

## 2023-12-17 DIAGNOSIS — L0390 Cellulitis, unspecified: Secondary | ICD-10-CM | POA: Diagnosis not present

## 2023-12-17 DIAGNOSIS — Z9641 Presence of insulin pump (external) (internal): Secondary | ICD-10-CM | POA: Diagnosis present

## 2023-12-17 DIAGNOSIS — Z9181 History of falling: Secondary | ICD-10-CM

## 2023-12-17 DIAGNOSIS — Z9842 Cataract extraction status, left eye: Secondary | ICD-10-CM

## 2023-12-17 DIAGNOSIS — I9589 Other hypotension: Secondary | ICD-10-CM | POA: Diagnosis present

## 2023-12-17 DIAGNOSIS — Z6830 Body mass index (BMI) 30.0-30.9, adult: Secondary | ICD-10-CM

## 2023-12-17 DIAGNOSIS — E669 Obesity, unspecified: Secondary | ICD-10-CM | POA: Diagnosis present

## 2023-12-17 LAB — BLOOD GAS, VENOUS
Acid-base deficit: 0.1 mmol/L (ref 0.0–2.0)
Bicarbonate: 26.8 mmol/L (ref 20.0–28.0)
Drawn by: 65579
O2 Saturation: 47.7 %
Patient temperature: 38.4
pCO2, Ven: 55 mmHg (ref 44–60)
pH, Ven: 7.3 (ref 7.25–7.43)
pO2, Ven: 37 mmHg (ref 32–45)

## 2023-12-17 LAB — COMPREHENSIVE METABOLIC PANEL WITH GFR
ALT: 22 U/L (ref 0–44)
AST: 28 U/L (ref 15–41)
Albumin: 2.3 g/dL — ABNORMAL LOW (ref 3.5–5.0)
Alkaline Phosphatase: 238 U/L — ABNORMAL HIGH (ref 38–126)
Anion gap: 18 — ABNORMAL HIGH (ref 5–15)
BUN: 72 mg/dL — ABNORMAL HIGH (ref 8–23)
CO2: 24 mmol/L (ref 22–32)
Calcium: 9 mg/dL (ref 8.9–10.3)
Chloride: 90 mmol/L — ABNORMAL LOW (ref 98–111)
Creatinine, Ser: 6.28 mg/dL — ABNORMAL HIGH (ref 0.44–1.00)
GFR, Estimated: 7 mL/min — ABNORMAL LOW (ref >60)
Glucose, Bld: 238 mg/dL — ABNORMAL HIGH (ref 70–99)
Potassium: 3.8 mmol/L (ref 3.5–5.1)
Sodium: 132 mmol/L — ABNORMAL LOW (ref 135–145)
Total Bilirubin: 1.1 mg/dL (ref 0.0–1.2)
Total Protein: 6.5 g/dL (ref 6.5–8.1)

## 2023-12-17 LAB — URINALYSIS, W/ REFLEX TO CULTURE (INFECTION SUSPECTED)
Glucose, UA: NEGATIVE mg/dL
Ketones, ur: 15 mg/dL — AB
Nitrite: NEGATIVE
Protein, ur: >300 mg/dL — AB
Specific Gravity, Urine: 1.025 (ref 1.005–1.030)
WBC, UA: >50 WBC/hpf (ref 0–5)
pH: 6 (ref 5.0–8.0)

## 2023-12-17 LAB — GLUCOSE, CAPILLARY
Glucose-Capillary: 187 mg/dL — ABNORMAL HIGH (ref 70–99)
Glucose-Capillary: 206 mg/dL — ABNORMAL HIGH (ref 70–99)
Glucose-Capillary: 233 mg/dL — ABNORMAL HIGH (ref 70–99)

## 2023-12-17 LAB — CBC WITH DIFFERENTIAL/PLATELET
Abs Immature Granulocytes: 0.19 K/uL — ABNORMAL HIGH (ref 0.00–0.07)
Basophils Absolute: 0.1 K/uL (ref 0.0–0.1)
Basophils Relative: 0 %
Eosinophils Absolute: 0 K/uL (ref 0.0–0.5)
Eosinophils Relative: 0 %
HCT: 32 % — ABNORMAL LOW (ref 36.0–46.0)
Hemoglobin: 9.9 g/dL — ABNORMAL LOW (ref 12.0–15.0)
Immature Granulocytes: 1 %
Lymphocytes Relative: 2 %
Lymphs Abs: 0.5 K/uL — ABNORMAL LOW (ref 0.7–4.0)
MCH: 27 pg (ref 26.0–34.0)
MCHC: 30.9 g/dL (ref 30.0–36.0)
MCV: 87.2 fL (ref 80.0–100.0)
Monocytes Absolute: 1.1 K/uL — ABNORMAL HIGH (ref 0.1–1.0)
Monocytes Relative: 5 %
Neutro Abs: 21.7 K/uL — ABNORMAL HIGH (ref 1.7–7.7)
Neutrophils Relative %: 92 %
Platelets: 288 K/uL (ref 150–400)
RBC: 3.67 MIL/uL — ABNORMAL LOW (ref 3.87–5.11)
RDW: 17.5 % — ABNORMAL HIGH (ref 11.5–15.5)
WBC: 23.6 K/uL — ABNORMAL HIGH (ref 4.0–10.5)
nRBC: 0 % (ref 0.0–0.2)

## 2023-12-17 LAB — RESP PANEL BY RT-PCR (RSV, FLU A&B, COVID)  RVPGX2
Influenza A by PCR: NEGATIVE
Influenza B by PCR: NEGATIVE
Resp Syncytial Virus by PCR: NEGATIVE
SARS Coronavirus 2 by RT PCR: NEGATIVE

## 2023-12-17 LAB — MAGNESIUM: Magnesium: 2.4 mg/dL (ref 1.7–2.4)

## 2023-12-17 LAB — PROTIME-INR
INR: 1.6 — ABNORMAL HIGH (ref 0.8–1.2)
Prothrombin Time: 20.2 s — ABNORMAL HIGH (ref 11.4–15.2)

## 2023-12-17 LAB — LACTIC ACID, PLASMA
Lactic Acid, Venous: 2 mmol/L (ref 0.5–1.9)
Lactic Acid, Venous: 1.3 mmol/L (ref 0.5–1.9)

## 2023-12-17 LAB — MRSA NEXT GEN BY PCR, NASAL: MRSA by PCR Next Gen: NOT DETECTED

## 2023-12-17 LAB — CBG MONITORING, ED: Glucose-Capillary: 297 mg/dL — ABNORMAL HIGH (ref 70–99)

## 2023-12-17 MED ORDER — SODIUM CHLORIDE 0.9 % IV BOLUS
500.0000 mL | Freq: Once | INTRAVENOUS | Status: AC
  Administered 2023-12-17: 500 mL via INTRAVENOUS

## 2023-12-17 MED ORDER — DOCUSATE SODIUM 100 MG PO CAPS
100.0000 mg | ORAL_CAPSULE | Freq: Two times a day (BID) | ORAL | Status: DC | PRN
  Administered 2023-12-20: 100 mg via ORAL
  Filled 2023-12-17: qty 1

## 2023-12-17 MED ORDER — BISACODYL 10 MG RE SUPP
10.0000 mg | Freq: Once | RECTAL | Status: AC
  Administered 2023-12-17: 10 mg via RECTAL
  Filled 2023-12-17: qty 1

## 2023-12-17 MED ORDER — VANCOMYCIN HCL IN DEXTROSE 1-5 GM/200ML-% IV SOLN: 1000.0000 mg | Freq: Once | INTRAVENOUS | Status: DC

## 2023-12-17 MED ORDER — LEVOTHYROXINE SODIUM 50 MCG PO TABS
50.0000 ug | ORAL_TABLET | Freq: Every day | ORAL | Status: DC
  Administered 2023-12-18 – 2023-12-25 (×8): 50 ug via ORAL
  Filled 2023-12-17 (×9): qty 1

## 2023-12-17 MED ORDER — SODIUM CHLORIDE 0.9 % IV BOLUS
1000.0000 mL | Freq: Once | INTRAVENOUS | Status: AC
  Administered 2023-12-17: 1000 mL via INTRAVENOUS

## 2023-12-17 MED ORDER — VANCOMYCIN HCL 2000 MG/400ML IV SOLN
2000.0000 mg | Freq: Once | INTRAVENOUS | Status: AC
  Administered 2023-12-17: 2000 mg via INTRAVENOUS
  Filled 2023-12-17: qty 400

## 2023-12-17 MED ORDER — DAKINS (1/4 STRENGTH) 0.125 % EX SOLN
Freq: Every day | CUTANEOUS | Status: AC
  Filled 2023-12-17: qty 473

## 2023-12-17 MED ORDER — SODIUM CHLORIDE 0.9 % IV SOLN
1.0000 g | INTRAVENOUS | Status: DC
  Administered 2023-12-17 – 2023-12-19 (×3): 1 g via INTRAVENOUS
  Filled 2023-12-17 (×3): qty 20

## 2023-12-17 MED ORDER — ACETAMINOPHEN 325 MG PO TABS
650.0000 mg | ORAL_TABLET | Freq: Once | ORAL | Status: AC
  Administered 2023-12-17: 650 mg via ORAL
  Filled 2023-12-17: qty 2

## 2023-12-17 MED ORDER — CHLORHEXIDINE GLUCONATE CLOTH 2 % EX PADS
6.0000 | MEDICATED_PAD | Freq: Every day | CUTANEOUS | Status: DC
  Administered 2023-12-17 – 2023-12-25 (×7): 6 via TOPICAL

## 2023-12-17 MED ORDER — MIDODRINE HCL 5 MG PO TABS: 5.0000 mg | ORAL_TABLET | Freq: Three times a day (TID) | ORAL | Status: DC

## 2023-12-17 MED ORDER — COLLAGENASE 250 UNIT/GM EX OINT
TOPICAL_OINTMENT | Freq: Every day | CUTANEOUS | Status: DC
  Administered 2023-12-21: 1 via TOPICAL
  Filled 2023-12-17: qty 30

## 2023-12-17 MED ORDER — AMIODARONE HCL 200 MG PO TABS
200.0000 mg | ORAL_TABLET | Freq: Every day | ORAL | Status: DC
  Administered 2023-12-18 – 2023-12-25 (×8): 200 mg via ORAL
  Filled 2023-12-17 (×8): qty 1

## 2023-12-17 MED ORDER — POLYETHYLENE GLYCOL 3350 17 G PO PACK: 17.0000 g | PACK | Freq: Every day | ORAL | Status: DC | PRN

## 2023-12-17 MED ORDER — COLLAGENASE 250 UNIT/GM EX OINT
TOPICAL_OINTMENT | Freq: Every day | CUTANEOUS | Status: DC
  Filled 2023-12-17: qty 30

## 2023-12-17 MED ORDER — VANCOMYCIN VARIABLE DOSE PER UNSTABLE RENAL FUNCTION (PHARMACIST DOSING): Status: DC

## 2023-12-17 MED ORDER — AMIODARONE HCL 200 MG PO TABS: 200.0000 mg | ORAL_TABLET | Freq: Every day | ORAL | Status: DC

## 2023-12-17 MED ORDER — PIPERACILLIN-TAZOBACTAM 3.375 G IVPB 30 MIN
3.3750 g | Freq: Once | INTRAVENOUS | Status: AC
  Administered 2023-12-17: 3.375 g via INTRAVENOUS
  Filled 2023-12-17: qty 50

## 2023-12-17 MED ORDER — ZINC OXIDE 40 % EX OINT
TOPICAL_OINTMENT | Freq: Two times a day (BID) | CUTANEOUS | Status: DC
  Administered 2023-12-23 – 2023-12-24 (×3): 1 via TOPICAL
  Filled 2023-12-17: qty 57

## 2023-12-17 MED ORDER — MIDODRINE HCL 5 MG PO TABS
5.0000 mg | ORAL_TABLET | Freq: Three times a day (TID) | ORAL | Status: DC
  Administered 2023-12-17 – 2023-12-18 (×3): 5 mg via ORAL
  Filled 2023-12-17 (×3): qty 1

## 2023-12-17 MED ORDER — NOREPINEPHRINE 4 MG/250ML-% IV SOLN
0.0000 ug/min | INTRAVENOUS | Status: DC
  Administered 2023-12-17: 5 ug/min via INTRAVENOUS
  Administered 2023-12-18: 1 ug/min via INTRAVENOUS
  Filled 2023-12-17 (×2): qty 250

## 2023-12-17 MED ORDER — HEPARIN (PORCINE) 25000 UT/250ML-% IV SOLN
1700.0000 [IU]/h | INTRAVENOUS | Status: DC
  Administered 2023-12-17: 1100 [IU]/h via INTRAVENOUS
  Administered 2023-12-18: 1400 [IU]/h via INTRAVENOUS
  Administered 2023-12-19: 1700 [IU]/h via INTRAVENOUS
  Filled 2023-12-17 (×3): qty 250

## 2023-12-17 MED ORDER — INSULIN ASPART 100 UNIT/ML IJ SOLN
0.0000 [IU] | INTRAMUSCULAR | Status: DC
  Administered 2023-12-17 – 2023-12-18 (×4): 5 [IU] via SUBCUTANEOUS
  Administered 2023-12-18 (×2): 3 [IU] via SUBCUTANEOUS
  Administered 2023-12-18: 5 [IU] via SUBCUTANEOUS
  Administered 2023-12-18: 3 [IU] via SUBCUTANEOUS
  Administered 2023-12-19: 2 [IU] via SUBCUTANEOUS
  Administered 2023-12-19: 8 [IU] via SUBCUTANEOUS
  Administered 2023-12-19 (×2): 3 [IU] via SUBCUTANEOUS
  Administered 2023-12-19: 2 [IU] via SUBCUTANEOUS
  Administered 2023-12-20 (×3): 3 [IU] via SUBCUTANEOUS
  Administered 2023-12-20: 5 [IU] via SUBCUTANEOUS
  Administered 2023-12-20 (×2): 3 [IU] via SUBCUTANEOUS
  Administered 2023-12-21: 8 [IU] via SUBCUTANEOUS
  Administered 2023-12-21: 3 [IU] via SUBCUTANEOUS
  Administered 2023-12-21: 8 [IU] via SUBCUTANEOUS
  Administered 2023-12-21: 5 [IU] via SUBCUTANEOUS
  Administered 2023-12-21 – 2023-12-22 (×5): 3 [IU] via SUBCUTANEOUS
  Administered 2023-12-22: 2 [IU] via SUBCUTANEOUS
  Administered 2023-12-22 – 2023-12-23 (×2): 3 [IU] via SUBCUTANEOUS
  Administered 2023-12-23: 5 [IU] via SUBCUTANEOUS
  Administered 2023-12-23: 3 [IU] via SUBCUTANEOUS
  Administered 2023-12-23: 2 [IU] via SUBCUTANEOUS

## 2023-12-17 MED ORDER — NOREPINEPHRINE 4 MG/250ML-% IV SOLN
0.0000 ug/min | INTRAVENOUS | Status: DC
  Administered 2023-12-17: 2 ug/min via INTRAVENOUS
  Filled 2023-12-17: qty 250

## 2023-12-17 MED ORDER — SODIUM CHLORIDE 0.9 % IV SOLN: 250.0000 mL | INTRAVENOUS | Status: AC

## 2023-12-17 NOTE — Inpatient Diabetes Management (Signed)
 Inpatient Diabetes Program Recommendations  AACE/ADA: New Consensus Statement on Inpatient Glycemic Control (2015)  Target Ranges:  Prepandial:   less than 140 mg/dL      Peak postprandial:   less than 180 mg/dL (1-2 hours)      Critically ill patients:  140 - 180 mg/dL   Lab Results  Component Value Date   GLUCAP 297 (H) 12/17/2023   HGBA1C 6.8 (H) 10/01/2023    Review of Glycemic Control  Diabetes history: DM type 1 Outpatient Diabetes medications:  Omnipod insulin  pump/Novolog Lenard Libre Basal rates: 12a-8a   0.3 units/hour 8a-8p     0.5 units/hour 8p-12a   0.3 units/hour  Total basal insulin  in a 24 hour period 9.6 units Carbohydrate coverage 1 unit of insulin  for every 18-20 grams of carbohydrates Correction/Sensitivity: 1 unit of insulin  drops glucose 65-70 points Insulin  duration 5 hours  Current orders for Inpatient glycemic control:  None yet being evaluated in ED  Insulin  pump and Dexcom removed from pt at this time.   Inpatient Diabetes Program Recommendations:    -   Semglee  10 units Q24 hours -   Novolog  0-6 units Q4 hours -   if eating Novolog  2 units tid meal coverage if eating >50% of meals  Endocrinologist: Dr. Tommas  Thanks,  Clotilda Bull RN, MSN, BC-ADM Inpatient Diabetes Coordinator Team Pager [phone] (8a-5p)

## 2023-12-17 NOTE — Progress Notes (Signed)
 PHARMACY - ANTICOAGULATION CONSULT NOTE  Pharmacy Consult for Heparin  Indication: atrial fibrillation  Allergies  Allergen Reactions   Cephalexin  Diarrhea   Codeine Nausea And Vomiting    Patient Measurements: Height: 5' 5 (165.1 cm) Weight: 77.9 kg (171 lb 11.8 oz) IBW/kg (Calculated) : 57 HEPARIN  DW (KG): 81.3  Vital Signs: Temp: 98.3 F (36.8 C) (07/24 1645) Temp Source: Oral (07/24 1645) BP: 108/51 (07/24 1530) Pulse Rate: 71 (07/24 1530)  Labs: Recent Labs    12/17/23 1145  HGB 9.9*  HCT 32.0*  PLT 288  LABPROT 20.2*  INR 1.6*  CREATININE 6.28*    Estimated Creatinine Clearance: 8.1 mL/min (A) (by C-G formula based on SCr of 6.28 mg/dL (H)).   Medical History: Past Medical History:  Diagnosis Date   Allergy    Anemia    hx   Anxiety    Arthritis    generalized (03/15/2014)   CAD (coronary artery disease)    MI in 2000 - MI  2007 - treated bare metal stent (no nuclear since then as 9/11)   Carotid artery disease (HCC)    CHF (congestive heart failure) (HCC)    Chronic diastolic heart failure (HCC)    a) ECHO (08/2013) EF 55-60% and RV function nl b) RHC (08/2013) RA 4, RV 30/5/7, PA 25/10 (16), PCWP 7, Fick CO/CI 6.3/2.7, PVR 1.5 WU, PA 61 and 66%   Clotting disorder (HCC)    Daily headache    ~ every other day; since I fell in June (03/15/2014)   Depression    Diabetic retinopathy (HCC)    Dyslipidemia    ESRD (end stage renal disease) (HCC)    Dialysis on Tues Thurs Sat   Exertional shortness of breath    GERD (gastroesophageal reflux disease)    History of blood transfusion    History of kidney stones    HTN (hypertension)    Hypothyroidism    Myocardial infarction (HCC)    Obesity    Osteoarthritis    Oxygen  deficiency    PAF (paroxysmal atrial fibrillation) (HCC)    Peripheral neuropathy    bilateral feet/hands   PONV (postoperative nausea and vomiting)    RBBB (right bundle branch block)    Old   Stroke (HCC)    mini strokes    Type II diabetes mellitus (HCC)    Type II, [place] libre left upper arm. patient has omnipod insulin  pump with Novolin R Insulin     Assessment: 24 YOF presenting with septic shock, on apixaban  PTA for atrial fibrillation- Med rec pending, LD not  recorded, though patient has been within the [place] system all day without any AC. Pharmacy consulted to dose heparin .    Goal of Therapy:  Heparin  level 0.3-0.7 units/ml aPTT 66-102 seconds Monitor platelets by anticoagulation protocol: Yes   Plan:  START heparin  infusion at 1,100 units/hour  Check aPTT/HL in 8 hours and daily while on heparin   CBC daily   Wealthy Danielski A Rashea Hoskie 12/17/2023,5:20 PM

## 2023-12-17 NOTE — ED Notes (Signed)
 While in and out pt, this tech and RN noticed that the urine coming out was a Clinical cytogeneticist. MD notified   Purewick is in place.

## 2023-12-17 NOTE — ED Provider Notes (Signed)
 [place] EMERGENCY DEPARTMENT AT [facility] Provider Note   CSN: [id number] Arrival date & time: 12/17/23  9064     Patient presents with: Wound Check   Susan [name] is a 73 y.o. female.   Patient has a history of diabetes, coronary artery disease, congestive heart failure.  She is also a dialysis patient and was post to get dialysis today.  She presents today with increasing fatigue and drainage from a decubitus ulcer.  Patient has been on antibiotics for suppose a urinary tract infection but she still makes urine.  The history is provided by the patient and a parent.  Weakness Severity:  Moderate Onset quality:  Sudden Timing:  Constant Progression:  Worsening Chronicity:  Recurrent Context: not alcohol use   Relieved by:  Nothing Worsened by:  Nothing Ineffective treatments:  None tried Associated symptoms: no abdominal pain, no anorexia, no chest pain, no cough, no diarrhea, no frequency, no headaches and no seizures        Prior to Admission medications   Medication Sig Start Date End Date Taking? Authorizing Provider  acetaminophen  (TYLENOL ) 325 MG tablet Take 2 tablets (650 mg total) by mouth every 6 (six) hours as needed for mild pain (or Fever >/= 101). 12/10/22   Angiulli, Toribio PARAS, PA-C  albuterol  (VENTOLIN  HFA) 108 (90 Base) MCG/ACT inhaler Inhale 1-2 puffs into the lungs every 6 (six) hours as needed for shortness of breath. 08/14/23   Duanne Butler DASEN, MD  amiodarone  (PACERONE ) 200 MG tablet Take 1 tablet (200 mg total) by mouth daily. 08/14/23   Duanne Butler DASEN, MD  apixaban  (ELIQUIS ) 5 MG TABS tablet Take 1 tablet (5 mg total) by mouth 2 (two) times daily. 08/14/23   Duanne Butler DASEN, MD  ascorbic acid  (VITAMIN C ) 500 MG tablet Take 1 tablet (500 mg total) by mouth daily. 12/11/22   Angiulli, Toribio PARAS, PA-C  atorvastatin  (LIPITOR ) 80 MG tablet Take 1 tablet (80 mg total) by mouth daily. 08/14/23   Duanne Butler DASEN, MD  calcitRIOL  (ROCALTROL ) 0.25  MCG capsule Take 9 capsules (2.25 mcg total) by mouth Every Tuesday,Thursday,and Saturday with dialysis. 08/15/23   Arlice Reichert, MD  clonazePAM  (KLONOPIN ) 0.5 MG disintegrating tablet Take 1 tablet (0.5 mg total) by mouth 2 (two) times daily. Stop lorazepam . 11/19/23   Duanne Butler DASEN, MD  folic acid  (FOLVITE ) 1 MG tablet TAKE 1 TABLET BY MOUTH EVERY DAY 12/15/23   Duanne Butler DASEN, MD  folic acid -vitamin b complex-vitamin c -selenium-zinc  (DIALYVITE) 3 MG TABS tablet Take 1 tablet by mouth daily.    [provider]  Insulin  Disposable Pump (OMNIPOD 5 [id number] INTRO GEN 5) KIT Inject 200 Units into the skin 3 days. Using Novolog  for pod    [provider]  levothyroxine  (SYNTHROID ) 50 MCG tablet Take 1 tablet (50 mcg total) by mouth daily before breakfast. 08/14/23   Duanne Butler DASEN, MD  melatonin 3 MG TABS tablet Take 3 mg by mouth at bedtime.    [provider]  methocarbamol  (ROBAXIN ) 750 MG tablet TAKE 1 TABLET (750 MG TOTAL) BY MOUTH EVERY 6 (SIX) HOURS AS NEEDED FOR MUSCLE SPASMS 12/01/23   Duanne Butler DASEN, MD  Methoxy PEG-Epoetin  Beta (MIRCERA IJ) Mircera 02/05/23 02/04/24  [provider]  midodrine  (PROAMATINE ) 10 MG tablet Take 1-2 tablets (10-20 mg total) by mouth Every Tuesday,Thursday,and Saturday with dialysis. 08/15/23   Duanne Butler DASEN, MD  nitrofurantoin , macrocrystal-monohydrate, (MACROBID ) 100 MG capsule Take 1 capsule (100 mg  total) by mouth daily. 12/11/23   Aletha Bene, MD  nitroGLYCERIN  (NITROSTAT ) 0.4 MG SL tablet Place 1 tablet (0.4 mg total) under the tongue every 5 (five) minutes as needed. 08/14/23 12/11/23  Duanne Butler DASEN, MD  OXYGEN  Inhale 2 L into the lungs at bedtime.    [provider]  pantoprazole  (PROTONIX ) 40 MG tablet Take 1 tablet (40 mg total) by mouth daily. 08/14/23   Duanne Butler DASEN, MD  polyethylene glycol (MIRALAX  / GLYCOLAX ) 17 g packet Take 17 g by mouth daily. 12/10/22   Angiulli, Daniel J, PA-C   predniSONE  (DELTASONE ) 20 MG tablet 3 tabs poqday 1-2, 2 tabs poqday 3-4, 1 tab poqday 5-6 10/26/23   Duanne Butler DASEN, MD  pregabalin  (LYRICA ) 150 MG capsule Take 150 mg by mouth 2 (two) times daily. 11/20/23   [provider]  sevelamer  carbonate (RENVELA ) 800 MG tablet Take 1,600 mg by mouth 3 (three) times daily with meals.    [provider]  sulfamethoxazole -trimethoprim  (BACTRIM  DS) 800-160 MG tablet Take 1 tablet by mouth 2 (two) times daily. 12/14/23   Duanne Butler DASEN, MD  venlafaxine  XR (EFFEXOR -XR) 75 MG 24 hr capsule Take 1 capsule (75 mg total) by mouth daily with breakfast. Stop trintellix  08/14/23   Duanne Butler DASEN, MD  Zinc  30 MG CAPS Take 30 mg by mouth daily.    [provider]    Allergies: Cephalexin  and Codeine    Review of Systems  Constitutional:  Negative for appetite change and fatigue.  HENT:  Negative for congestion, ear discharge and sinus pressure.   Eyes:  Negative for discharge.  Respiratory:  Negative for cough.   Cardiovascular:  Negative for chest pain.  Gastrointestinal:  Negative for abdominal pain, anorexia and diarrhea.  Genitourinary:  Negative for frequency and hematuria.  Musculoskeletal:  Negative for back pain.  Skin:  Negative for rash.  Neurological:  Positive for weakness. Negative for seizures and headaches.  Psychiatric/Behavioral:  Negative for hallucinations.     Updated Vital Signs BP (!) 97/59   Pulse 69   Temp 98 F (36.7 C) (Oral)   Resp (!) 9   Ht 5' 5 (1.651 m)   Wt 104.7 kg   SpO2 100%   BMI 38.41 kg/m   Physical Exam Vitals and nursing note reviewed.  Constitutional:      Appearance: She is well-developed. She is ill-appearing.     Comments: Lethargic  HENT:     Head: Normocephalic.     Nose: Nose normal.     Mouth/Throat:     Mouth: Mucous membranes are dry.  Eyes:     General: No scleral icterus.    Conjunctiva/sclera: Conjunctivae normal.  Neck:     Thyroid : No thyromegaly.   Cardiovascular:     Rate and Rhythm: Normal rate and regular rhythm.     Heart sounds: No murmur heard.    No friction rub. No gallop.  Pulmonary:     Breath sounds: No stridor. No wheezing or rales.  Chest:     Chest wall: No tenderness.  Abdominal:     General: There is no distension.     Tenderness: There is no abdominal tenderness. There is no rebound.  Musculoskeletal:        General: Normal range of motion.     Cervical back: Neck supple.     Comments: Large decubitus ulcer over the sacrum with significant drainage that is foul-smelling  Lymphadenopathy:     Cervical: No  cervical adenopathy.  Skin:    Findings: No erythema or rash.  Neurological:     Mental Status: She is oriented to person, place, and time.     Motor: No abnormal muscle tone.     Coordination: Coordination normal.  Psychiatric:        Behavior: Behavior normal.     (all labs ordered are listed, but only abnormal results are displayed) Labs Reviewed  LACTIC ACID, PLASMA - Abnormal; Notable for the following components:      Result Value   Lactic Acid, Venous 2.0 (*)    All other components within normal limits  COMPREHENSIVE METABOLIC PANEL WITH GFR - Abnormal; Notable for the following components:   Sodium 132 (*)    Chloride 90 (*)    Glucose, Bld 238 (*)    BUN 72 (*)    Creatinine, Ser 6.28 (*)    Albumin  2.3 (*)    Alkaline Phosphatase 238 (*)    GFR, Estimated 7 (*)    Anion gap 18 (*)    All other components within normal limits  CBC WITH DIFFERENTIAL/PLATELET - Abnormal; Notable for the following components:   WBC 23.6 (*)    RBC 3.67 (*)    Hemoglobin 9.9 (*)    HCT 32.0 (*)    RDW 17.5 (*)    Neutro Abs 21.7 (*)    Lymphs Abs 0.5 (*)    Monocytes Absolute 1.1 (*)    Abs Immature Granulocytes 0.19 (*)    All other components within normal limits  PROTIME-INR - Abnormal; Notable for the following components:   Prothrombin  Time 20.2 (*)    INR 1.6 (*)    All other components  within normal limits  CBG MONITORING, ED - Abnormal; Notable for the following components:   Glucose-Capillary 297 (*)    All other components within normal limits  RESP PANEL BY RT-PCR (RSV, FLU A&B, COVID)  RVPGX2  CULTURE, BLOOD (ROUTINE X 2)  CULTURE, BLOOD (ROUTINE X 2)  MAGNESIUM   BLOOD GAS, VENOUS  LACTIC ACID, PLASMA  URINALYSIS, W/ REFLEX TO CULTURE (INFECTION SUSPECTED)  I-STAT CHEM 8, ED    EKG: None  Radiology: CT ABDOMEN PELVIS WO CONTRAST Result Date: 12/17/2023 CLINICAL DATA:  Decubitus wounds. EXAM: CT ABDOMEN AND PELVIS WITHOUT CONTRAST TECHNIQUE: Multidetector CT imaging of the abdomen and pelvis was performed following the standard protocol without IV contrast. RADIATION DOSE REDUCTION: This exam was performed according to the departmental dose-optimization program which includes automated exposure control, adjustment of the mA and/or kV according to patient size and/or use of iterative reconstruction technique. COMPARISON:  Oct 01, 2023. FINDINGS: Lower chest: No acute abnormality. Hepatobiliary: Cholelithiasis. No biliary dilatation. Liver is unremarkable. Pancreas: Unremarkable. No pancreatic ductal dilatation or surrounding inflammatory changes. Spleen: Multiple small calcified splenic granulomas are noted. Adrenals/Urinary Tract: Adrenal glands appear normal. Bilateral renal atrophy is noted consistent with end-stage renal disease. No hydronephrosis or renal obstruction is noted. Urinary bladder is unremarkable. Stomach/Bowel: Stomach is within normal limits. Appendix appears normal. No evidence of bowel wall thickening, distention, or inflammatory changes. Moderate amount of stool is seen in the sigmoid colon and rectum. Vascular/Lymphatic: Aortic atherosclerosis. No enlarged abdominal or pelvic lymph nodes. Reproductive: Status post hysterectomy. No adnexal masses. Other: No ascites or hernia is noted. Large sacral decubitus ulceration or wound is noted, but does not  appear to extend to underlying bone. Musculoskeletal: No acute or significant osseous findings. IMPRESSION: Interval development of large sacral decubitus ulceration or wound  is noted described above. Cholelithiasis. Bilateral renal atrophy consistent with history of end-stage renal disease. Moderate amount of stool is seen in sigmoid colon and rectum concerning for possible impaction. Aortic Atherosclerosis (ICD10-I70.0). Electronically Signed   By: Lynwood Landy Raddle M.D.   On: 12/17/2023 11:48   DG Chest Port 1 View Result Date: 12/17/2023 CLINICAL DATA:  . questionable sepsis.  Left large ache.  Confusion EXAM: PORTABLE CHEST 1 VIEW COMPARISON:  Chest x-ray 10/01/2023 FINDINGS: Underinflation. Stable cardiopericardial silhouette with calcified aorta. Bronchovascular crowding. No consolidation, pneumothorax or effusion. Double-lumen right IJ catheter with tip overlying the SVC right atrial junction region. Overlapping cardiac leads. Degenerative changes along the spine. IMPRESSION: Underinflation. Bronchovascular crowding. Double-lumen right IJ catheter. No consolidation. Electronically Signed   By: Ranell Bring M.D.   On: 12/17/2023 11:42     Procedures   Medications Ordered in the ED  vancomycin  (VANCOREADY) IVPB 2000 mg/400 mL (2,000 mg Intravenous New Bag/Given 12/17/23 1155)  norepinephrine  (LEVOPHED ) 4mg  in (0.016 mg/mL) premix infusion (3 mcg/min Intravenous Rate/Dose Change 12/17/23 1327)  sodium chloride  0.9 % bolus 1,000 mL (1,000 mLs Intravenous Bolus 12/17/23 1152)  piperacillin -tazobactam (ZOSYN ) IVPB 3.375 g (0 g Intravenous Stopped 12/17/23 1218)  sodium chloride  0.9 % bolus 1,000 mL (1,000 mLs Intravenous Bolus 12/17/23 1224)  acetaminophen  (TYLENOL ) tablet 650 mg (650 mg Oral Given 12/17/23 1223)  CRITICAL CARE Performed by: Fairy Sermon Total critical care time: 55 minutes Critical care time was exclusive of separately billable procedures and treating other patients. Critical  care was necessary to treat or prevent imminent or life-threatening deterioration. Critical care was time spent personally by me on the following activities: development of treatment plan with patient and/or surrogate as well as nursing, discussions with consultants, evaluation of patient's response to treatment, examination of patient, obtaining history from patient or surrogate, ordering and performing treatments and interventions, ordering and review of laboratory studies, ordering and review of radiographic studies, pulse oximetry and re-evaluation of patient's condition.  Patient was initially given 2 L of normal saline and started on Vanco and Zosyn .  Patient blood pressure was in the 70s initially.  She only improved to 80 systolic.  Patient was started on Levophed  and her blood pressure improved.  I spoke with critical care Dr. Kara and he excepted the patient to be admitted to [facility] critical care service ICU                                 Medical Decision Making Amount and/or Complexity of Data Reviewed Labs: ordered. Radiology: ordered.  Risk OTC drugs. Prescription drug management. Decision regarding hospitalization.   Severe sepsis with hypotension most likely related to infected decubitus ulcer and urinary tract infection     Final diagnoses:  None    ED Discharge Orders     None          Sermon Fairy, MD 12/20/23 1231

## 2023-12-17 NOTE — Sepsis Progress Note (Signed)
 Elink monitoring for the code sepsis protocol.

## 2023-12-17 NOTE — ED Triage Notes (Signed)
 Pt arrived via POV from home c/o multiple pressure wounds. Pts spouse reports the worst wound is on her bottom. Pt is dialysis Pt Tues, Thurs, Sat and did not make it to her appointment today. Pt wheelchair bound at baseline.

## 2023-12-17 NOTE — Progress Notes (Signed)
 PHARMACY ANTIBIOTIC CONSULT NOTE   Susan [name] a 73 y.o. female with extensve microbiologic history presenting with sepsis (hypotension, fever- Tm 101.2, WBC 23.6)- primary source believed to be sacral decubitus ulcers which recently have had interval enlargement and increased drainage. However, patient frequently has UTIs- most recently in March with ESBL E. Coli- UTI remains on differential for now. PAtient is ESRD on HD.  Pharmacy has been consulted for Vancomycin /meropenem  dosing.  Patient has already received a 2g vancomycin  load ~1200 7/24.   Estimated Creatinine Clearance: 8.1 mL/min (A) (by C-G formula based on SCr of 6.28 mg/dL (H)).  Plan: START meropenem  1g IV Q24H  Vancomycin  variable dose per unstable renal function- F/U HD schedule for maintenance dosing  F/U C/S  Allergies:  Allergies  Allergen Reactions   Cephalexin  Diarrhea   Codeine Nausea And Vomiting    Filed Weights   12/17/23 1022 12/17/23 1643  Weight: 104.7 kg (230 lb 13.2 oz) 77.9 kg (171 lb 11.8 oz)       Latest Ref Rng & Units 12/17/2023   11:45 AM 10/07/2023    4:32 AM 10/06/2023    4:13 AM  CBC  WBC 4.0 - 10.5 K/uL 23.6  5.1  5.4   Hemoglobin 12.0 - 15.0 g/dL 9.9  9.8  89.8   Hematocrit 36.0 - 46.0 % 32.0  33.0  33.5   Platelets 150 - 400 K/uL 288  191  205     Antibiotics Given (last 72 hours)     Date/Time Action Medication Dose Rate   12/17/23 1153 New Bag/Given   piperacillin -tazobactam (ZOSYN ) IVPB 3.375 g 3.375 g 100 mL/hr   12/17/23 1155 New Bag/Given   vancomycin  (VANCOREADY) IVPB 2000 mg/400 mL 2,000 mg 200 mL/hr       Antimicrobials this admission: Vancomycin  7/24>> Meropenem  7/24>> Zosyn  7/24 x1   Microbiology results: 7/24 Bcx: sent 7/24 Ucx: sent  7/24 MRSA PCR: sent  4-plex PCR negative   Thank you for allowing pharmacy to be a part of this patient's care.  Massie Fila, PharmD Clinical Pharmacist  12/17/2023 5:13 PM

## 2023-12-17 NOTE — ED Notes (Signed)
 Attempted to get blood cultures, got blood return but blood flow stopped. RN is attempting IV

## 2023-12-17 NOTE — Sepsis Progress Note (Signed)
Pt is difficult stick

## 2023-12-17 NOTE — Consult Note (Addendum)
 WOC Nurse Consult Note: Reason for Consult: sacral wound  Wound type:  unstageable Pressure injury sacrum Pressure Injury POA: Yes Measurement: see nursing flowsheet  Wound azi:aojrx eschar tan necrotic tissue  Drainage (amount, consistency, odor) see nursing flowsheet  Periwound: purple maroon discoloration indicating began as a deep tissue pressure injury  Dressing procedure/placement/frequency: Cleanse sacral wound with Vashe wound cleanser Soila [phone]) do not rinse and allow to air dry. Apply Dakin's moistened gauze to wound bed daily x 3 days, cover with dry gauze and ABD pad and tape.  Patient should remain on a low air loss mattress if moved out of ICU setting. After 3 days of Dakins (12/20/2023) will begin Santyl  for continued debridement.    Would recommend surgical evaluation of this wound for possible surgical debridement.   POC discussed with bedside nurse. WOC team will not follow. Re-consult if further needs arise.   Thank you,    Powell Bar MSN, RN-BC, [employer]

## 2023-12-17 NOTE — Sepsis Progress Note (Signed)
 Pt is HD pt

## 2023-12-17 NOTE — ED Triage Notes (Signed)
 Pt reports having an appointment at the wound care center in [facility] at the end of August, but cannot wait until then.  Pt presents with new onset of Tremors.

## 2023-12-17 NOTE — H&P (Signed)
 NAME:  Susan [name], MRN:  [id number], DOB:  01-05-1951, LOS: 0 ADMISSION DATE:  12/17/2023, CONSULTATION DATE:  12/17/23 REFERRING MD:  S[facility] - [facility] EM, CHIEF COMPLAINT:  wound check // hypotension    History of Present Illness:  73 yo F PMH ESRD on HD, DM2 on insulin , CAD, HFrEF, pAF, decub ulcers POA, hx enterococcus bacteremia who presented to [name] ED 12/17/23 for wound check. Reportedly incr drainage from sacral decub at home.  Was hypotensive in ED so started on low dose periph pressors WBC 23 , LA initially 2. Nursing notes [facility] green urine. Wounds w c/f cellulitis  CT imaging does not reveal extension of sacral decub to bone   PCCM asked to admit in this setting   Pt is not sure why she is here. She does not know why her husband took her to ED.  Pertinent  Medical History   CAD HFrEF DM2 pAF Decub ulcer ESRD  Bacteremia   Significant Hospital Events: Including procedures, antibiotic start and stop dates in addition to other pertinent events   7/24 admit to [place] for periph pressors  Interim History / Subjective:  Arrives to [place]   Objective    Blood pressure (!) 117/57, pulse 71, temperature 98.3 F (36.8 C), temperature source Oral, resp. rate 12, height 5' 5 (1.651 m), weight 77.9 kg, SpO2 100%.        Intake/Output Summary (Last 24 hours) at 12/17/2023 1723 Last data filed at 12/17/2023 1235 Gross per 24 hour  Intake 37.96 ml  Output --  Net 37.96 ml   Filed Weights   12/17/23 1022 12/17/23 1643  Weight: 104.7 kg 77.9 kg    Examination: General: chronically and acutely ill older adult F  HENT: NCAT pink mm  Lungs: even unlabored on RA  Cardiovascular: rrr Abdomen: round soft  Extremities: no obvious acute deformity  Neuro: Awake following commands, a little disoriented  GU: defer  Resolved problem list  Lactic acidosis   Assessment and Plan   Acute encephalopathy Hx depression, anxiety, pain  -presume sepsis related  P -delirium precautions,  holding home CNS depressing meds for now   Septic shock Sacral decub POA  -cellulitis v UTI v bacteremia  -hx e coli and enterococcus bacteremia  -recent UTI  -looks like baseline Bps might be variable, seeing some SBP 90s in office notes  P -broad abx -- vanc + mero given 07/2023 hx e coli bacteremia resistant to zosyn   -WOC consult -follow Bcx Ucx  -periph NE for MAP 65  -will add some midodrine    ESRD  Hyponatremia  P -call nephro tomorrow, no firm indication for HD today   HFrEF  Afib  Chronic AC  P -hep gtt for now, is on eliquis  at home  -amio -no urgent indication for ECHO.   Anemia  P -follow CBC   DM2 w hyperglycemia -SSI   Best Practice (right click and Reselect all SmartList Selections daily)   Diet/type: NPO w/ oral meds DVT prophylaxis systemic heparin  Pressure ulcer(s): present on admission  GI prophylaxis: N/A Lines: N/A Foley:  N/A Code Status:  full code Last date of multidisciplinary goals of care discussion [--]  Labs   CBC: Recent Labs  Lab 12/17/23 1145  WBC 23.6*  NEUTROABS 21.7*  HGB 9.9*  HCT 32.0*  MCV 87.2  PLT 288    Basic Metabolic Panel: Recent Labs  Lab 12/17/23 1145  NA 132*  K 3.8  CL 90*  CO2 24  GLUCOSE 238*  BUN 72*  CREATININE 6.28*  CALCIUM  9.0  MG 2.4   GFR: Estimated Creatinine Clearance: 8.1 mL/min (A) (by C-G formula based on SCr of 6.28 mg/dL (H)). Recent Labs  Lab 12/17/23 1145 12/17/23 1352  WBC 23.6*  --   LATICACIDVEN 2.0* 1.3    Liver Function Tests: Recent Labs  Lab 12/17/23 1145  AST 28  ALT 22  ALKPHOS 238*  BILITOT 1.1  PROT 6.5  ALBUMIN  2.3*   No results for input(s): LIPASE, AMYLASE in the last 168 hours. No results for input(s): AMMONIA in the last 168 hours.  ABG    Component Value Date/Time   PHART 7.39 06/11/2022 2152   PCO2ART 49 (H) 06/11/2022 2152   PO2ART 110 (H) 06/11/2022 2152   HCO3 26.8 12/17/2023 1213   TCO2 32 07/31/2023 0702   ACIDBASEDEF  0.1 12/17/2023 1213   O2SAT 47.7 12/17/2023 1213     Coagulation Profile: Recent Labs  Lab 12/17/23 1145  INR 1.6*    Cardiac Enzymes: No results for input(s): CKTOTAL, CKMB, CKMBINDEX, TROPONINI in the last 168 hours.  HbA1C: Hgb A1c MFr Bld  Date/Time Value Ref Range Status  10/01/2023 01:37 PM 6.8 (H) 4.8 - 5.6 % Final    Comment:    (NOTE) Pre diabetes:          5.7%-6.4%  Diabetes:              >6.4%  Glycemic control for   <7.0% adults with diabetes   02/27/2023 12:40 PM 9.0 (H) <5.7 % of total Hgb Final    Comment:    For someone without known diabetes, a hemoglobin A1c value of 6.5% or greater indicates that they may have  diabetes and this should be confirmed with a follow-up  test. . For someone with known diabetes, a value <7% indicates  that their diabetes is well controlled and a value  greater than or equal to 7% indicates suboptimal  control. A1c targets should be individualized based on  duration of diabetes, age, comorbid conditions, and  other considerations. . Currently, no consensus exists regarding use of hemoglobin A1c for diagnosis of diabetes for children. .     CBG: Recent Labs  Lab 12/17/23 1035 12/17/23 1644  GLUCAP 297* 187*    Review of Systems:   Review of Systems  Constitutional:  Positive for malaise/fatigue.  Genitourinary:  Positive for dysuria.  Musculoskeletal:  Positive for myalgias.  Skin:        wound  Neurological:  Positive for tremors.  Psychiatric/Behavioral:  Positive for depression. The patient is nervous/anxious.      Past Medical History:  She,  has a past medical history of Allergy, Anemia, Anxiety, Arthritis, CAD (coronary artery disease), Carotid artery disease (HCC), CHF (congestive heart failure) (HCC), Chronic diastolic heart failure (HCC), Clotting disorder (HCC), Daily headache, Depression, Diabetic retinopathy (HCC), Dyslipidemia, ESRD (end stage renal disease) (HCC), Exertional  shortness of breath, GERD (gastroesophageal reflux disease), History of blood transfusion, History of kidney stones, HTN (hypertension), Hypothyroidism, Myocardial infarction (HCC), Obesity, Osteoarthritis, Oxygen  deficiency, PAF (paroxysmal atrial fibrillation) (HCC), Peripheral neuropathy, PONV (postoperative nausea and vomiting), RBBB (right bundle branch block), Stroke (HCC), and Type II diabetes mellitus (HCC).   Surgical History:   Past Surgical History:  Procedure Laterality Date   A/V FISTULAGRAM Left 11/07/2022   Procedure: A/V Fistulagram;  Surgeon: Magda Debby SAILOR, MD;  Location: [facility] INVASIVE CV LAB;  Service: Cardiovascular;  Laterality: Left;   ABDOMINAL HYSTERECTOMY  1980's   AMPUTATION Right 02/24/2018   Procedure: RIGHT FOOT GREAT TOE AND 2ND TOE AMPUTATION;  Surgeon: Harden Jerona GAILS, MD;  Location: [facility] OR;  Service: Orthopedics;  Laterality: Right;   AMPUTATION Right 04/30/2018   Procedure: RIGHT TRANSMETATARSAL AMPUTATION;  Surgeon: Harden Jerona GAILS, MD;  Location: [facility] OR;  Service: Orthopedics;  Laterality: Right;   AMPUTATION Right 05/02/2022   Procedure: RIGHT BELOW KNEE AMPUTATION;  Surgeon: Harden Jerona GAILS, MD;  Location: [facility] OR;  Service: Orthopedics;  Laterality: Right;   APPLICATION OF WOUND VAC Right 06/13/2022   Procedure: APPLICATION OF WOUND VAC;  Surgeon: Harden Jerona GAILS, MD;  [facility]: [facility] OR;  Service: Orthopedics;  Laterality: Right;   APPLICATION OF WOUND VAC Left 11/14/2022   Procedure: APPLICATION OF WOUND VAC;  Surgeon: Harden Jerona GAILS, MD;  Location: [facility] OR;  Service: Orthopedics;  Laterality: Left;   AV FISTULA PLACEMENT Left 04/02/2022   Procedure: LEFT ARM ARTERIOVENOUS (AV) FISTULA CREATION;  Surgeon: Serene Gaile ORN, MD;  Location: [facility] OR;  Service: Vascular;  Laterality: Left;  PERIPHERAL NERVE BLOCK   AV FISTULA PLACEMENT Right 04/27/2023   Procedure: RIGHT ARM BRACHIOBASILIC ARTERIOVENOUS (AV) FISTULA CREATION;  Surgeon: Magda Debby SAILOR, MD;  Location: [facility] OR;   Service: Vascular;  Laterality: Right;   BASCILIC VEIN TRANSPOSITION Left 07/31/2022   Procedure: LEFT ARM SECOND STAGE BASILIC VEIN TRANSPOSITION;  Surgeon: Serene Gaile ORN, MD;  Location: [facility] OR;  Service: Vascular;  Laterality: Left;   BIOPSY  05/27/2020   Procedure: BIOPSY;  Surgeon: Cindie Carlin POUR, DO;  [facility];  Service: Endoscopy;;   CATARACT EXTRACTION, BILATERAL Bilateral ?2013   COLONOSCOPY W/ POLYPECTOMY     COLONOSCOPY WITH PROPOFOL  N/A 03/13/2019   Procedure: COLONOSCOPY WITH PROPOFOL ;  Surgeon: Albertus Gordy HERO, MD;  Location: [facility] ENDOSCOPY;  Service: Gastroenterology;  Laterality: N/A;   CORONARY ANGIOPLASTY WITH STENT PLACEMENT  1999; 2007   1 + 1   ERCP N/A 02/03/2022   Procedure: ENDOSCOPIC RETROGRADE CHOLANGIOPANCREATOGRAPHY (ERCP);  Surgeon: Rollin Dover, MD;  Location: [facility] ENDOSCOPY;  Service: Gastroenterology;  Laterality: N/A;   ESOPHAGOGASTRODUODENOSCOPY N/A 02/12/2023   Procedure: ESOPHAGOGASTRODUODENOSCOPY (EGD);  Surgeon: Rollin Dover, MD;  Location: [facility] ENDOSCOPY;  Service: Gastroenterology;  Laterality: N/A;   ESOPHAGOGASTRODUODENOSCOPY (EGD) WITH PROPOFOL  N/A 03/13/2019   Procedure: ESOPHAGOGASTRODUODENOSCOPY (EGD) WITH PROPOFOL ;  Surgeon: Albertus Gordy HERO, MD;  Location: [facility] ENDOSCOPY;  Service: Gastroenterology;  Laterality: N/A;   ESOPHAGOGASTRODUODENOSCOPY (EGD) WITH PROPOFOL  N/A 05/27/2020   Procedure: ESOPHAGOGASTRODUODENOSCOPY (EGD) WITH PROPOFOL ;  Surgeon: Cindie Carlin POUR, DO;  [facility];  Service: Endoscopy;  Laterality: N/A;   ESOPHAGOGASTRODUODENOSCOPY (EGD) WITH PROPOFOL  N/A 09/03/2022   Procedure: ESOPHAGOGASTRODUODENOSCOPY (EGD) WITH PROPOFOL ;  Surgeon: Rollin Dover, MD;  Location: [facility] ENDOSCOPY;  Service: Gastroenterology;  Laterality: N/A;   EYE SURGERY Bilateral    lazer   FLEXIBLE SIGMOIDOSCOPY N/A 05/23/2022   Procedure: FLEXIBLE SIGMOIDOSCOPY;  Surgeon: Rollin Dover, MD;  Location: [facility] ENDOSCOPY;  Service: Gastroenterology;   Laterality: N/A;   FLEXIBLE SIGMOIDOSCOPY N/A 05/24/2022   Procedure: FLEXIBLE SIGMOIDOSCOPY;  Surgeon: Federico Rosario BROCKS, MD;  Location: [facility] ENDOSCOPY;  Service: Gastroenterology;  Laterality: N/A;   FLEXIBLE SIGMOIDOSCOPY N/A 09/03/2022   Procedure: FLEXIBLE SIGMOIDOSCOPY;  Surgeon: Rollin Dover, MD;  Location: [facility] ENDOSCOPY;  Service: Gastroenterology;  Laterality: N/A;   HEMOSTASIS CLIP PLACEMENT  03/13/2019   Procedure: HEMOSTASIS CLIP PLACEMENT;  Surgeon: Albertus Gordy HERO, MD;  Location[facility] ENDOSCOPY;  Service: Gastroenterology;;   HEMOSTASIS CLIP PLACEMENT  05/23/2022   Procedure: HEMOSTASIS CLIP PLACEMENT;  Surgeon: Rollin Dover, MD;  Location: [facility] ENDOSCOPY;  Service: Gastroenterology;;   HEMOSTASIS CONTROL  05/24/2022   Procedure: HEMOSTASIS CONTROL;  Surgeon: Federico Rosario BROCKS, MD;  Location: [facility] ENDOSCOPY;  Service: Gastroenterology;;   HOT HEMOSTASIS N/A 05/23/2022   Procedure: HOT HEMOSTASIS (ARGON PLASMA COAGULATION/BICAP);  Surgeon: Rollin Dover, MD;  Location: [facility] ENDOSCOPY;  Service: Gastroenterology;  Laterality: N/A;   I & D EXTREMITY Left 05/05/2022   Procedure: IRRIGATION AND DEBRIDEMENT LEFT ARM AV FISTULA;  Surgeon: Gretta Lonni PARAS, MD;  Location: [facility] OR;  Service: Vascular;  Laterality: Left;   I & D EXTREMITY N/A 11/14/2022   Procedure: IRRIGATION AND DEBRIDEMENT OF LOWER EXTREMITY WOUND;  Surgeon: Harden Jerona GAILS, MD;  [facility] OR;  Service: Orthopedics;  Laterality: N/A;   INSERTION OF DIALYSIS CATHETER Right 04/02/2022   Procedure: INSERTION OF TUNNELED DIALYSIS CATHETER;  Surgeon: Serene Gaile ORN, MD;  [facility] OR;  Service: Vascular;  Laterality: Right;   IR FLUORO GUIDE CV LINE RIGHT  08/10/2023   IR VENOCAVAGRAM IVC  08/10/2023   KNEE ARTHROSCOPY Left 10/25/2006   POLYPECTOMY  03/13/2019   Procedure: POLYPECTOMY;  Surgeon: Albertus Gordy HERO, MD;  Location: [facility] ENDOSCOPY;  Service: Gastroenterology;;   REMOVAL OF STONES  02/03/2022   Procedure: REMOVAL OF STONES;   Surgeon: Rollin Dover, MD;  Location: [facility] ENDOSCOPY;  Service: Gastroenterology;;   REVISON OF ARTERIOVENOUS FISTULA Left 08/20/2022   Procedure: REVISON OF LEFT ARM ARTERIOVENOUS FISTULA;  Surgeon: Serene Gaile ORN, MD;  [facility] OR;  Service: Vascular;  Laterality: Left;   RIGHT HEART CATH N/A 07/24/2017   Procedure: RIGHT HEART CATH;  Surgeon: Cherrie Toribio SAUNDERS, MD;  [facility] INVASIVE CV LAB;  Service: Cardiovascular;  Laterality: N/A;   RIGHT HEART CATHETERIZATION N/A 09/22/2013   Procedure: RIGHT HEART CATH;  Surgeon: Toribio SAUNDERS Cherrie, MD;  Location: [facility] CATH LAB;  Service: Cardiovascular;  Laterality: N/A;   SHOULDER ARTHROSCOPY WITH OPEN ROTATOR CUFF REPAIR Right 03/14/2014   Procedure: RIGHT SHOULDER ARTHROSCOPY WITH BICEPS RELEASE, OPEN SUBSCAPULA REPAIR, OPEN SUPRASPINATUS REPAIR.;  Surgeon: Cordella Glendia Hutchinson, MD;  Location: [facility] OR;  Service: Orthopedics;  Laterality: Right;   SPHINCTEROTOMY  02/03/2022   Procedure: SPHINCTEROTOMY;  Surgeon: Rollin Dover, MD;  Location: [facility] ENDOSCOPY;  Service: Gastroenterology;;   STUMP REVISION Right 06/13/2022   Procedure: REVISION RIGHT BELOW KNEE AMPUTATION;  Surgeon: Harden Jerona GAILS, MD;  Location: [facility] OR;  Service: Orthopedics;  Laterality: Right;   TEE WITHOUT CARDIOVERSION N/A 02/04/2022   Procedure: TRANSESOPHAGEAL ECHOCARDIOGRAM (TEE);  Surgeon: Cherrie Toribio SAUNDERS, MD;  Location: [facility] ENDOSCOPY;  Service: Cardiovascular;  Laterality: N/A;   THROMBECTOMY W/ EMBOLECTOMY Left 08/20/2022   Procedure: THROMBECTOMY OF LEFT ARM ARTERIOVENOUS FISTULA;  Surgeon: Serene Gaile ORN, MD;  Location: [facility] OR;  Service: Vascular;  Laterality: Left;   TOE AMPUTATION Right 02/24/2018   GREAT TOE AND 2ND TOE AMPUTATION   TUBAL LIGATION  1970's   UPPER EXTREMITY VENOGRAPHY N/A 07/31/2023   Procedure: UPPER EXTREMITY VENOGRAPHY;  Surgeon: Magda Debby SAILOR, MD;  [facility] INVASIVE CV LAB;  Service: Cardiovascular;  Laterality: N/A;     Social History:    reports that she quit smoking about 26 years ago. Her smoking use included cigarettes. She started smoking about 58 years ago. She has a 96 pack-year smoking history. She has never used smokeless tobacco. She reports that she does not currently use alcohol. She reports that she does not  use drugs.   Family History:  Her family history includes Heart attack (age of onset: 75) in her mother.   Allergies Allergies  Allergen Reactions   Cephalexin  Diarrhea   Codeine Nausea And Vomiting     Home Medications  Prior to Admission medications   Medication Sig Start Date End Date Taking? Authorizing Provider  acetaminophen  (TYLENOL ) 325 MG tablet Take 2 tablets (650 mg total) by mouth every 6 (six) hours as needed for mild pain (or Fever >/= 101). 12/10/22   Angiulli, Toribio PARAS, PA-C  albuterol  (VENTOLIN  HFA) 108 (90 Base) MCG/ACT inhaler Inhale 1-2 puffs into the lungs every 6 (six) hours as needed for shortness of breath. 08/14/23   Duanne Butler DASEN, MD  amiodarone  (PACERONE ) 200 MG tablet Take 1 tablet (200 mg total) by mouth daily. 08/14/23   Duanne Butler DASEN, MD  apixaban  (ELIQUIS ) 5 MG TABS tablet Take 1 tablet (5 mg total) by mouth 2 (two) times daily. 08/14/23   Duanne Butler DASEN, MD  ascorbic acid  (VITAMIN C ) 500 MG tablet Take 1 tablet (500 mg total) by mouth daily. 12/11/22   Angiulli, Toribio PARAS, PA-C  atorvastatin  (LIPITOR ) 80 MG tablet Take 1 tablet (80 mg total) by mouth daily. 08/14/23   Duanne Butler DASEN, MD  calcitRIOL  (ROCALTROL ) 0.25 MCG capsule Take 9 capsules (2.25 mcg total) by mouth Every Tuesday,Thursday,and Saturday with dialysis. 08/15/23   Arlice Reichert, MD  clonazePAM  (KLONOPIN ) 0.5 MG disintegrating tablet Take 1 tablet (0.5 mg total) by mouth 2 (two) times daily. Stop lorazepam . 11/19/23   Duanne Butler DASEN, MD  folic acid  (FOLVITE ) 1 MG tablet TAKE 1 TABLET BY MOUTH EVERY DAY 12/15/23   Duanne Butler DASEN, MD  folic acid -vitamin b complex-vitamin c -selenium-zinc  (DIALYVITE) 3 MG  TABS tablet Take 1 tablet by mouth daily.    [provider]  Insulin  Disposable Pump (OMNIPOD 5 [id number] PODS GEN 5) MISC Inject 1 Application into the skin 3 days. 12/13/23   [provider]  levothyroxine  (SYNTHROID ) 50 MCG tablet Take 1 tablet (50 mcg total) by mouth daily before breakfast. 08/14/23   Duanne Butler DASEN, MD  melatonin 3 MG TABS tablet Take 3 mg by mouth at bedtime.    [provider]  methocarbamol  (ROBAXIN ) 750 MG tablet TAKE 1 TABLET (750 MG TOTAL) BY MOUTH EVERY 6 (SIX) HOURS AS NEEDED FOR MUSCLE SPASMS 12/01/23   Duanne Butler DASEN, MD  Methoxy PEG-Epoetin  Beta (MIRCERA IJ) Mircera 02/05/23 02/04/24  [provider]  midodrine  (PROAMATINE ) 10 MG tablet Take 1-2 tablets (10-20 mg total) by mouth Every Tuesday,Thursday,and Saturday with dialysis. 08/15/23   Duanne Butler DASEN, MD  nitrofurantoin , macrocrystal-monohydrate, (MACROBID ) 100 MG capsule Take 1 capsule (100 mg total) by mouth daily. 12/11/23   Aletha Bene, MD  nitroGLYCERIN  (NITROSTAT ) 0.4 MG SL tablet Place 1 tablet (0.4 mg total) under the tongue every 5 (five) minutes as needed. 08/14/23 12/11/23  Duanne Butler DASEN, MD  ofloxacin (OCUFLOX) 0.3 % ophthalmic solution Place 1 drop into the left eye 4 (four) times daily. 12/13/23   [provider]  OXYGEN  Inhale 2 L into the lungs at bedtime.    [provider]  pantoprazole  (PROTONIX ) 40 MG tablet Take 1 tablet (40 mg total) by mouth daily. 08/14/23   Duanne Butler DASEN, MD  polyethylene glycol (MIRALAX  / GLYCOLAX ) 17 g packet Take 17 g by mouth daily. 12/10/22   Angiulli, Toribio PARAS, PA-C  predniSONE  (DELTASONE ) 20 MG tablet 3 tabs poqday 1-2, 2  tabs poqday 3-4, 1 tab poqday 5-6 10/26/23   Duanne Butler DASEN, MD  pregabalin  (LYRICA ) 150 MG capsule Take 150 mg by mouth 2 (two) times daily. 11/20/23   [provider]  sevelamer  carbonate (RENVELA ) 800 MG tablet Take 1,600 mg by mouth 3 (three) times daily with meals.     [provider]  sulfamethoxazole -trimethoprim  (BACTRIM  DS) 800-160 MG tablet Take 1 tablet by mouth 2 (two) times daily. 12/14/23   Duanne Butler DASEN, MD  venlafaxine  XR (EFFEXOR -XR) 75 MG 24 hr capsule Take 1 capsule (75 mg total) by mouth daily with breakfast. Stop trintellix  08/14/23   Duanne Butler DASEN, MD  Zinc  30 MG CAPS Take 30 mg by mouth daily.    [provider]     Critical care time: 46 min       CRITICAL CARE Performed by: Ronnald FORBES Gave   Total critical care time: 46 minutes  Critical care time was exclusive of separately billable procedures and treating other patients. Critical care was necessary to treat or prevent imminent or life-threatening deterioration.  Critical care was time spent personally by me on the following activities: development of treatment plan with patient and/or surrogate as well as nursing, discussions with consultants, evaluation of patient's response to treatment, examination of patient, obtaining history from patient or surrogate, ordering and performing treatments and interventions, ordering and review of laboratory studies, ordering and review of radiographic studies, pulse oximetry and re-evaluation of patient's condition.  Ronnald Gave MSN, AGACNP-BC [facility] Amion for pager  12/17/2023, 5:23 PM

## 2023-12-17 NOTE — Sepsis Progress Note (Signed)
 Notified provider and bedside nurse of need to order and administer fluid bolus pt needs 3141 cc.

## 2023-12-18 DIAGNOSIS — I87332 Chronic venous hypertension (idiopathic) with ulcer and inflammation of left lower extremity: Secondary | ICD-10-CM

## 2023-12-18 DIAGNOSIS — A419 Sepsis, unspecified organism: Secondary | ICD-10-CM

## 2023-12-18 DIAGNOSIS — R6521 Severe sepsis with septic shock: Secondary | ICD-10-CM

## 2023-12-18 DIAGNOSIS — Z992 Dependence on renal dialysis: Secondary | ICD-10-CM | POA: Diagnosis not present

## 2023-12-18 DIAGNOSIS — N186 End stage renal disease: Secondary | ICD-10-CM | POA: Diagnosis not present

## 2023-12-18 LAB — BASIC METABOLIC PANEL WITH GFR
Anion gap: 22 — ABNORMAL HIGH (ref 5–15)
BUN: 73 mg/dL — ABNORMAL HIGH (ref 8–23)
CO2: 20 mmol/L — ABNORMAL LOW (ref 22–32)
Calcium: 8.9 mg/dL (ref 8.9–10.3)
Chloride: 90 mmol/L — ABNORMAL LOW (ref 98–111)
Creatinine, Ser: 6.53 mg/dL — ABNORMAL HIGH (ref 0.44–1.00)
GFR, Estimated: 6 mL/min — ABNORMAL LOW (ref >60)
Glucose, Bld: 235 mg/dL — ABNORMAL HIGH (ref 70–99)
Potassium: 4.3 mmol/L (ref 3.5–5.1)
Sodium: 132 mmol/L — ABNORMAL LOW (ref 135–145)

## 2023-12-18 LAB — CBC
HCT: 33.2 % — ABNORMAL LOW (ref 36.0–46.0)
Hemoglobin: 10.1 g/dL — ABNORMAL LOW (ref 12.0–15.0)
MCH: 26.4 pg (ref 26.0–34.0)
MCHC: 30.4 g/dL (ref 30.0–36.0)
MCV: 86.9 fL (ref 80.0–100.0)
Platelets: 345 K/uL (ref 150–400)
RBC: 3.82 MIL/uL — ABNORMAL LOW (ref 3.87–5.11)
RDW: 17.4 % — ABNORMAL HIGH (ref 11.5–15.5)
WBC: 23.1 K/uL — ABNORMAL HIGH (ref 4.0–10.5)
nRBC: 0 % (ref 0.0–0.2)

## 2023-12-18 LAB — GLUCOSE, CAPILLARY
Glucose-Capillary: 157 mg/dL — ABNORMAL HIGH (ref 70–99)
Glucose-Capillary: 183 mg/dL — ABNORMAL HIGH (ref 70–99)
Glucose-Capillary: 190 mg/dL — ABNORMAL HIGH (ref 70–99)
Glucose-Capillary: 206 mg/dL — ABNORMAL HIGH (ref 70–99)
Glucose-Capillary: 229 mg/dL — ABNORMAL HIGH (ref 70–99)
Glucose-Capillary: 234 mg/dL — ABNORMAL HIGH (ref 70–99)

## 2023-12-18 LAB — APTT
aPTT: 45 s — ABNORMAL HIGH (ref 24–36)
aPTT: 45 s — ABNORMAL HIGH (ref 24–36)

## 2023-12-18 LAB — TSH: TSH: 4.025 u[IU]/mL (ref 0.350–4.500)

## 2023-12-18 LAB — HEPARIN LEVEL (UNFRACTIONATED): Heparin Unfractionated: >1.1 [IU]/mL — ABNORMAL HIGH (ref 0.30–0.70)

## 2023-12-18 MED ORDER — MIDODRINE HCL 5 MG PO TABS
10.0000 mg | ORAL_TABLET | Freq: Three times a day (TID) | ORAL | Status: DC
  Administered 2023-12-18 – 2023-12-25 (×20): 10 mg via ORAL
  Filled 2023-12-18 (×20): qty 2

## 2023-12-18 MED ORDER — HEPARIN BOLUS VIA INFUSION
2000.0000 [IU] | Freq: Once | INTRAVENOUS | Status: AC
  Administered 2023-12-18: 2000 [IU] via INTRAVENOUS
  Filled 2023-12-18: qty 2000

## 2023-12-18 MED ORDER — PREGABALIN 25 MG PO CAPS
150.0000 mg | ORAL_CAPSULE | Freq: Two times a day (BID) | ORAL | Status: DC
  Administered 2023-12-18 – 2023-12-20 (×5): 150 mg via ORAL
  Filled 2023-12-18 (×6): qty 2

## 2023-12-18 MED ORDER — INSULIN GLARGINE-YFGN 100 UNIT/ML ~~LOC~~ SOLN
10.0000 [IU] | Freq: Every day | SUBCUTANEOUS | Status: DC
  Administered 2023-12-18 – 2023-12-25 (×8): 10 [IU] via SUBCUTANEOUS
  Filled 2023-12-18 (×8): qty 0.1

## 2023-12-18 MED ORDER — METHOCARBAMOL 750 MG PO TABS
750.0000 mg | ORAL_TABLET | Freq: Four times a day (QID) | ORAL | Status: DC | PRN
  Administered 2023-12-18 – 2023-12-22 (×2): 750 mg via ORAL
  Filled 2023-12-18: qty 1
  Filled 2023-12-18: qty 2

## 2023-12-18 MED ORDER — CHLORHEXIDINE GLUCONATE CLOTH 2 % EX PADS
6.0000 | MEDICATED_PAD | Freq: Every day | CUTANEOUS | Status: DC
  Administered 2023-12-19 – 2023-12-25 (×7): 6 via TOPICAL

## 2023-12-18 MED ORDER — ORAL CARE MOUTH RINSE: 15.0000 mL | OROMUCOSAL | Status: DC | PRN

## 2023-12-18 MED ORDER — HEPARIN BOLUS VIA INFUSION
1000.0000 [IU] | Freq: Once | INTRAVENOUS | Status: AC
  Administered 2023-12-18: 1000 [IU] via INTRAVENOUS
  Filled 2023-12-18: qty 1000

## 2023-12-18 MED ORDER — ALBUMIN HUMAN 25 % IV SOLN
25.0000 g | Freq: Four times a day (QID) | INTRAVENOUS | Status: AC
  Administered 2023-12-18 (×3): 25 g via INTRAVENOUS
  Filled 2023-12-18 (×3): qty 100

## 2023-12-18 MED ORDER — CALCITRIOL 0.5 MCG PO CAPS
2.2500 ug | ORAL_CAPSULE | ORAL | Status: DC
  Administered 2023-12-22 – 2023-12-24 (×2): 2.25 ug via ORAL
  Filled 2023-12-18 (×3): qty 1

## 2023-12-18 NOTE — IPAL (Addendum)
  Interdisciplinary Goals of Care Family Meeting   Date carried out: 12/18/2023  Location of the meeting: Bedside  Member's involved: Nurse Practitioner and Family Member or next of kin  Durable Power of Attorney or Environmental health practitioner: pt    Discussion: We discussed goals of care for [name] .  CCS PA present for part of discussion, husband present throughout.   We had started talkinga bout code status this morning. Susan [name] was conflicted as she has been steadfast in not wanting to be on a vent. Discussed resusc measures and encouraged her to think about it, talk w husband   She has decided she wants to be DNR/I Pressors ok Wants to explore aggressive medical options and be eval for possible surgery  I have conveyed to CCS who will eval sacral wound  Code status:   Code Status: Prior   Disposition: Continue current acute care  Time spent for the meeting: 14 min     Susan [name] Gave, NP 12/18/2023, 11:16 AM

## 2023-12-18 NOTE — Progress Notes (Signed)
 PHARMACY - ANTICOAGULATION CONSULT NOTE  Pharmacy Consult for Heparin  Indication: atrial fibrillation  Allergies  Allergen Reactions   Cephalexin  Diarrhea   Codeine Nausea And Vomiting   Patient Measurements: Height: 5' 5 (165.1 cm) Weight: 77.9 kg (171 lb 11.8 oz) IBW/kg (Calculated) : 57 HEPARIN  DW (KG): 81.3  Vital Signs: Temp: 97.5 F (36.4 C) (07/25 0315) Temp Source: Oral (07/25 0315) BP: 116/43 (07/25 0645) Pulse Rate: 77 (07/25 0645)  Labs: Recent Labs    12/17/23 1145 12/18/23 0356  HGB 9.9* 10.1*  HCT 32.0* 33.2*  PLT 288 345  APTT  --  45*  LABPROT 20.2*  --   INR 1.6*  --   HEPARINUNFRC  --  >1.10*  CREATININE 6.28* 6.53*   Estimated Creatinine Clearance: 7.8 mL/min (A) (by C-G formula based on SCr of 6.53 mg/dL (H)).  Medical History: Past Medical History:  Diagnosis Date   Allergy    Anemia    hx   Anxiety    Arthritis    generalized (03/15/2014)   CAD (coronary artery disease)    MI in 2000 - MI  2007 - treated bare metal stent (no nuclear since then as 9/11)   Carotid artery disease (HCC)    CHF (congestive heart failure) (HCC)    Chronic diastolic heart failure (HCC)    a) ECHO (08/2013) EF 55-60% and RV function nl b) RHC (08/2013) RA 4, RV 30/5/7, PA 25/10 (16), PCWP 7, Fick CO/CI 6.3/2.7, PVR 1.5 WU, PA 61 and 66%   Clotting disorder (HCC)    Daily headache    ~ every other day; since I fell in June (03/15/2014)   Depression    Diabetic retinopathy (HCC)    Dyslipidemia    ESRD (end stage renal disease) (HCC)    Dialysis on Tues Thurs Sat   Exertional shortness of breath    GERD (gastroesophageal reflux disease)    History of blood transfusion    History of kidney stones    HTN (hypertension)    Hypothyroidism    Myocardial infarction (HCC)    Obesity    Osteoarthritis    Oxygen  deficiency    PAF (paroxysmal atrial fibrillation) (HCC)    Peripheral neuropathy    bilateral feet/hands   PONV (postoperative nausea and  vomiting)    RBBB (right bundle branch block)    Old   Stroke (HCC)    mini strokes   Type II diabetes mellitus (HCC)    Type II, [place] libre left upper arm. patient has omnipod insulin  pump with Novolin R Insulin     Assessment: 46 YOF presenting with septic shock, on apixaban  PTA for atrial fibrillation. Med rec not completed, but fill history is consistent with adherence. Pt receiving heparin  infusion while awaiting plans for possible surgical debridement of sacral wound. Hgb stable at ~10 and platelets WNL. No issues with bleeding or heparin  infusion reported.  Heparin  level elevated in setting of PTA Eliquis . aPTT is subtherapeutic at 45 seconds at infusion rate of 1100 units/h.   Goal of Therapy:  Heparin  level 0.3-0.7 units/ml aPTT 66-102 seconds Monitor platelets by anticoagulation protocol: Yes   Plan:  Give heparin  bolus 1,000 units now and increase heparin  infusion to 1400 units/h. Check aPTT in 8 hours to confirm in therapeutic range. Monitor aPTT, heparin  level, and CBC daily   Maurilio Patten, PharmD PGY1 Pharmacy Resident [facility]  12/18/2023 7:41 AM

## 2023-12-18 NOTE — Progress Notes (Addendum)
 NAME:  NYIESHA BEEVER, MRN:  [id number], DOB:  1950-10-29, LOS: 1 ADMISSION DATE:  12/17/2023, CONSULTATION DATE:  12/17/23 REFERRING MD:  [facility] - [facility] EM, CHIEF COMPLAINT:  wound check // hypotension    History of Present Illness:  73 yo F PMH ESRD on HD, DM2 on insulin , CAD, HFrEF, pAF, decub ulcers POA, hx enterococcus bacteremia who presented to [facility] ED 12/17/23 for wound check. Reportedly incr drainage from sacral decub at home.  Was hypotensive in ED so started on low dose periph pressors WBC 23 , LA initially 2. Nursing notes [employer] urine. Wounds w c/f cellulitis  CT imaging does not reveal extension of sacral decub to bone   PCCM asked to admit in this setting   Pt is not sure why she is here. She does not know why her husband took her to ED.  Pertinent  Medical History   CAD HFrEF DM2 pAF Decub ulcer ESRD  Bacteremia   Significant Hospital Events: Including procedures, antibiotic start and stop dates in addition to other pertinent events   7/24 admit to [place] for periph pressors  Interim History / Subjective:  Arrives to [place]   Objective    Blood pressure 101/71, pulse 79, temperature 98.2 F (36.8 C), temperature source Oral, resp. rate 20, height 5' 5 (1.651 m), weight 77.9 kg, SpO2 100%.        Intake/Output Summary (Last 24 hours) at 12/18/2023 0851 Last data filed at 12/18/2023 0800 Gross per 24 hour  Intake 1048.88 ml  Output 0 ml  Net 1048.88 ml   Filed Weights   12/17/23 1022 12/17/23 1643 12/18/23 0135  Weight: 104.7 kg 77.9 kg 77.9 kg    Examination: General: chronically ill and acutely ill elderly F NAD  HENT: NCAT pink mm anicteric sclera  Lungs: CTAb  Cardiovascular: rr Abdomen: soft round  Extremities: LLE wound with drainage  Neuro: Awake following commands, a little disoriented  GU: no foley   Resolved problem list  Lactic acidosis  Acute encephalopathy  Assessment and Plan   Hx depression, anxiety, pain  -presume sepsis related   P -delirium precautions -restart home lyrica , robaxin  -clarify home effexor . Will order home clonazepam  as BID PRN instead of twice a day   Septic shock C/f cellulitis r/t sacral decub POA and LLE wound POA  -cellulitis v UTI v bacteremia  -hx e coli and enterococcus bacteremia  -recent UTI  -looks like baseline Bps might be variable, seeing some SBP 90s in office notes  P -broad abx -- mero given 07/2023 hx e coli bacteremia resistant to zosyn . MRSA PCR neg, dc vanc   -WOC consulted, ortho has been engaged and we will reach out to [facility] re her sacral wound to see if this might need debridement  -follow Bcx Ucx  -periph NE for MAP 65  -cont midodrine    ESRD  Hyponatremia  P -will call nephro   HFrEF  Afib  Chronic AC  P -hep gtt for now, is on eliquis  at home  -amio  Anemia, stable   P -follow CBC   DM2 w hyperglycemia -will d/w pharmD re increasing insulin  coverage  -- pt is not able to give a hx re her home insulin  amount, states her husband manages this.   Best Practice (right click and Reselect all SmartList Selections daily)   Diet/type: Regular consistency (see orders) DVT prophylaxis systemic heparin  Pressure ulcer(s): present on admission  GI prophylaxis: N/A Lines: N/A Foley:  N/A Code Status:  full code -- we talked about code status in depth 7/25. She is conflicted about what she might want and will contemplate this, knows she is currently full code  Last date of multidisciplinary goals of care discussion [--]  Labs   CBC: Recent Labs  Lab 12/17/23 1145 12/18/23 0356  WBC 23.6* 23.1*  NEUTROABS 21.7*  --   HGB 9.9* 10.1*  HCT 32.0* 33.2*  MCV 87.2 86.9  PLT 288 345    Basic Metabolic Panel: Recent Labs  Lab 12/17/23 1145 12/18/23 0356  NA 132* 132*  K 3.8 4.3  CL 90* 90*  CO2 24 20*  GLUCOSE 238* 235*  BUN 72* 73*  CREATININE 6.28* 6.53*  CALCIUM  9.0 8.9  MG 2.4  --    GFR: Estimated Creatinine Clearance: 7.8 mL/min (A) (by C-G  formula based on SCr of 6.53 mg/dL (H)). Recent Labs  Lab 12/17/23 1145 12/17/23 1352 12/18/23 0356  WBC 23.6*  --  23.1*  LATICACIDVEN 2.0* 1.3  --     Liver Function Tests: Recent Labs  Lab 12/17/23 1145  AST 28  ALT 22  ALKPHOS 238*  BILITOT 1.1  PROT 6.5  ALBUMIN  2.3*   No results for input(s): LIPASE, AMYLASE in the last 168 hours. No results for input(s): AMMONIA in the last 168 hours.  ABG    Component Value Date/Time   PHART 7.39 06/11/2022 2152   PCO2ART 49 (H) 06/11/2022 2152   PO2ART 110 (H) 06/11/2022 2152   HCO3 26.8 12/17/2023 1213   TCO2 32 07/31/2023 0702   ACIDBASEDEF 0.1 12/17/2023 1213   O2SAT 47.7 12/17/2023 1213     Coagulation Profile: Recent Labs  Lab 12/17/23 1145  INR 1.6*    Cardiac Enzymes: No results for input(s): CKTOTAL, CKMB, CKMBINDEX, TROPONINI in the last 168 hours.  HbA1C: Hgb A1c MFr Bld  Date/Time Value Ref Range Status  10/01/2023 01:37 PM 6.8 (H) 4.8 - 5.6 % Final    Comment:    (NOTE) Pre diabetes:          5.7%-6.4%  Diabetes:              >6.4%  Glycemic control for   <7.0% adults with diabetes   02/27/2023 12:40 PM 9.0 (H) <5.7 % of total Hgb Final    Comment:    For someone without known diabetes, a hemoglobin A1c value of 6.5% or greater indicates that they may have  diabetes and this should be confirmed with a follow-up  test. . For someone with known diabetes, a value <7% indicates  that their diabetes is well controlled and a value  greater than or equal to 7% indicates suboptimal  control. A1c targets should be individualized based on  duration of diabetes, age, comorbid conditions, and  other considerations. . Currently, no consensus exists regarding use of hemoglobin A1c for diagnosis of diabetes for children. .     CBG: Recent Labs  Lab 12/17/23 1644 12/17/23 1932 12/17/23 2334 12/18/23 0327 12/18/23 0808  GLUCAP 187* 206* 233* 206* 190*     Critical care time:  46 min     CRITICAL CARE Performed by: Ronnald FORBES Gave  Total critical care time: 46 minutes  Critical care time was exclusive of separately billable procedures and treating other patients. Critical care was necessary to treat or prevent imminent or life-threatening deterioration.  Critical care was time spent personally by me on the following activities: development of treatment plan with patient and/or surrogate as well  as nursing, discussions with consultants, evaluation of patient's response to treatment, examination of patient, obtaining history from patient or surrogate, ordering and performing treatments and interventions, ordering and review of laboratory studies, ordering and review of radiographic studies, pulse oximetry and re-evaluation of patient's condition.  Ronnald Gave MSN, AGACNP-BC [facility] Amion for pager  12/18/2023, 8:51 AM

## 2023-12-18 NOTE — Consult Note (Signed)
 [facility] Renal Consultation Note    Indication for Consultation:  Management of ESRD/hemodialysis, anemia, hypertension/volume, and secondary hyperparathyroidism.   HPI: Susan [name] is a 73 y.o. female with PMH of ESRD on HD TTS at [facility], DM2 on insulin , CAD, HFrEF, pAF, decub ulcers POA, right BKA, hx enterococcus bacteremia who presented to [facility] for a wound check on 12/17/23. It was reported that she had increased drainage from her sacral wound at home. She was hypotensive and started on peripheral vasopressors. Her WBC on admission was 23 with a LA of 2. Per nursing staff, she had dark green urine.   We have been consulted to manage her HD needs while she is in the hospital. We will plan for HD tonight/tomorrow. She denies any dyspnea and is on RA.   Past Medical History:  Diagnosis Date   Allergy    Anemia    hx   Anxiety    Arthritis    generalized (03/15/2014)   CAD (coronary artery disease)    MI in 2000 - MI  2007 - treated bare metal stent (no nuclear since then as 9/11)   Carotid artery disease (HCC)    CHF (congestive heart failure) (HCC)    Chronic diastolic heart failure (HCC)    a) ECHO (08/2013) EF 55-60% and RV function nl b) RHC (08/2013) RA 4, RV 30/5/7, PA 25/10 (16), PCWP 7, Fick CO/CI 6.3/2.7, PVR 1.5 WU, PA 61 and 66%   Clotting disorder (HCC)    Daily headache    ~ every other day; since I fell in June (03/15/2014)   Depression    Diabetic retinopathy (HCC)    Dyslipidemia    ESRD (end stage renal disease) (HCC)    Dialysis on Tues Thurs Sat   Exertional shortness of breath    GERD (gastroesophageal reflux disease)    History of blood transfusion    History of kidney stones    HTN (hypertension)    Hypothyroidism    Myocardial infarction (HCC)    Obesity    Osteoarthritis    Oxygen  deficiency    PAF (paroxysmal atrial fibrillation) (HCC)    Peripheral neuropathy    bilateral feet/hands   PONV (postoperative nausea and vomiting)     RBBB (right bundle branch block)    Old   Stroke (HCC)    mini strokes   Type II diabetes mellitus (HCC)    Type II, Barn[place] libre left upper arm. patient has omnipod insulin  pump with Novolin R Insulin    Past Surgical History:  Procedure Laterality Date   A/V FISTULAGRAM Left 11/07/2022   Procedure: A/V Fistulagram;  Surgeon: Magda Debby SAILOR, MD;  [facility];  Service: Cardiovascular;  Laterality: Left;   ABDOMINAL HYSTERECTOMY  1980's   AMPUTATION Right 02/24/2018   Procedure: RIGHT FOOT GREAT TOE AND 2ND TOE AMPUTATION;  Surgeon: Harden Jerona GAILS, MD;  Location: [facility] OR;  Service: Orthopedics;  Laterality: Right;   AMPUTATION Right 04/30/2018   Procedure: RIGHT TRANSMETATARSAL AMPUTATION;  Surgeon: Harden Jerona GAILS, MD;  Location: [facility] OR;  Service: Orthopedics;  Laterality: Right;   AMPUTATION Right 05/02/2022   Procedure: RIGHT BELOW KNEE AMPUTATION;  Surgeon: Harden Jerona GAILS, MD;  Location: [facility] OR;  Service: Orthopedics;  Laterality: Right;   APPLICATION OF WOUND VAC Right 06/13/2022   Procedure: APPLICATION OF WOUND VAC;  Surgeon: Harden Jerona GAILS, MD;  Location: [facility] OR;  Service: Orthopedics;  Laterality: Right;   APPLICATION OF WOUND VAC  Left 11/14/2022   Procedure: APPLICATION OF WOUND VAC;  Surgeon: Harden Jerona GAILS, MD;  Location: [facility] OR;  Service: Orthopedics;  Laterality: Left;   AV FISTULA PLACEMENT Left 04/02/2022   Procedure: LEFT ARM ARTERIOVENOUS (AV) FISTULA CREATION;  Surgeon: Serene Gaile ORN, MD;  [facility] OR;  Service: Vascular;  Laterality: Left;  PERIPHERAL NERVE BLOCK   AV FISTULA PLACEMENT Right 04/27/2023   Procedure: RIGHT ARM BRACHIOBASILIC ARTERIOVENOUS (AV) FISTULA CREATION;  Surgeon: Magda Debby SAILOR, MD;  [facility]: [facility] OR;  Service: Vascular;  Laterality: Right;   BASCILIC VEIN TRANSPOSITION Left 07/31/2022   Procedure: LEFT ARM SECOND STAGE BASILIC VEIN TRANSPOSITION;  Surgeon: Serene Gaile ORN, MD;  [facility] OR;  Service: Vascular;  Laterality:  Left;   BIOPSY  05/27/2020   Procedure: BIOPSY;  Surgeon: Cindie Carlin POUR, DO;  [facility]: [facility];  Service: Endoscopy;;   CATARACT EXTRACTION, BILATERAL Bilateral ?2013   COLONOSCOPY W/ POLYPECTOMY     COLONOSCOPY WITH PROPOFOL  N/A 03/13/2019   Procedure: COLONOSCOPY WITH PROPOFOL ;  Surgeon: Albertus Gordy HERO, MD;  Location: [facility] ENDOSCOPY;  Service: Gastroenterology;  Laterality: N/A;   CORONARY ANGIOPLASTY WITH STENT PLACEMENT  1999; 2007   1 + 1   ERCP N/A 02/03/2022   Procedure: ENDOSCOPIC RETROGRADE CHOLANGIOPANCREATOGRAPHY (ERCP);  Surgeon: Rollin Dover, MD;  Location: [facility] ENDOSCOPY;  Service: Gastroenterology;  Laterality: N/A;   ESOPHAGOGASTRODUODENOSCOPY N/A 02/12/2023   Procedure: ESOPHAGOGASTRODUODENOSCOPY (EGD);  Surgeon: Rollin Dover, MD;  Location: [facility] ENDOSCOPY;  Service: Gastroenterology;  Laterality: N/A;   ESOPHAGOGASTRODUODENOSCOPY (EGD) WITH PROPOFOL  N/A 03/13/2019   Procedure: ESOPHAGOGASTRODUODENOSCOPY (EGD) WITH PROPOFOL ;  Surgeon: Albertus Gordy HERO, MD;  Location: [facility] ENDOSCOPY;  Service: Gastroenterology;  Laterality: N/A;   ESOPHAGOGASTRODUODENOSCOPY (EGD) WITH PROPOFOL  N/A 05/27/2020   Procedure: ESOPHAGOGASTRODUODENOSCOPY (EGD) WITH PROPOFOL ;  Surgeon: Cindie Carlin POUR, DO;  [facility];  Service: Endoscopy;  Laterality: N/A;   ESOPHAGOGASTRODUODENOSCOPY (EGD) WITH PROPOFOL  N/A 09/03/2022   Procedure: ESOPHAGOGASTRODUODENOSCOPY (EGD) WITH PROPOFOL ;  Surgeon: Rollin Dover, MD;  Location: [facility] ENDOSCOPY;  Service: Gastroenterology;  Laterality: N/A;   EYE SURGERY Bilateral    lazer   FLEXIBLE SIGMOIDOSCOPY N/A 05/23/2022   Procedure: FLEXIBLE SIGMOIDOSCOPY;  Surgeon: Rollin Dover, MD;  Location: [facility] ENDOSCOPY;  Service: Gastroenterology;  Laterality: N/A;   FLEXIBLE SIGMOIDOSCOPY N/A 05/24/2022   Procedure: FLEXIBLE SIGMOIDOSCOPY;  Surgeon: Federico Rosario BROCKS, MD;  Location: [facility] ENDOSCOPY;  Service: Gastroenterology;  Laterality: N/A;   FLEXIBLE SIGMOIDOSCOPY N/A  09/03/2022   Procedure: FLEXIBLE SIGMOIDOSCOPY;  Surgeon: Rollin Dover, MD;  Location: [facility] ENDOSCOPY;  Service: Gastroenterology;  Laterality: N/A;   HEMOSTASIS CLIP PLACEMENT  03/13/2019   Procedure: HEMOSTASIS CLIP PLACEMENT;  Surgeon: Albertus Gordy HERO, MD;  Location: [facility] ENDOSCOPY;  Service: Gastroenterology;;   HEMOSTASIS CLIP PLACEMENT  05/23/2022   Procedure: HEMOSTASIS CLIP PLACEMENT;  Surgeon: Rollin Dover, MD;  Location: [facility] ENDOSCOPY;  Service: Gastroenterology;;   HEMOSTASIS CONTROL  05/24/2022   Procedure: HEMOSTASIS CONTROL;  Surgeon: Federico Rosario BROCKS, MD;  Location: [facility] ENDOSCOPY;  Service: Gastroenterology;;   HOT HEMOSTASIS N/A 05/23/2022   Procedure: HOT HEMOSTASIS (ARGON PLASMA COAGULATION/BICAP);  Surgeon: Rollin Dover, MD;  Location: [facility] ENDOSCOPY;  Service: Gastroenterology;  Laterality: N/A;   I & D EXTREMITY Left 05/05/2022   Procedure: IRRIGATION AND DEBRIDEMENT LEFT ARM AV FISTULA;  Surgeon: Gretta Lonni PARAS, MD;  [facility] OR;  Service: Vascular;  Laterality: Left;   I & D EXTREMITY N/A 11/14/2022   Procedure: IRRIGATION AND DEBRIDEMENT OF LOWER EXTREMITY WOUND;  Surgeon: Harden Jerona GAILS, MD;  Location: [facility] OR;  Service: Orthopedics;  Laterality: N/A;   INSERTION OF DIALYSIS CATHETER Right 04/02/2022   Procedure: INSERTION OF TUNNELED DIALYSIS CATHETER;  Surgeon: Serene Gaile ORN, MD;  [facility] OR;  Service: Vascular;  Laterality: Right;   IR FLUORO GUIDE CV LINE RIGHT  08/10/2023   IR VENOCAVAGRAM IVC  08/10/2023   KNEE ARTHROSCOPY Left 10/25/2006   POLYPECTOMY  03/13/2019   Procedure: POLYPECTOMY;  Surgeon: Albertus Gordy HERO, MD;  Location: [facility] ENDOSCOPY;  Service: Gastroenterology;;   REMOVAL OF STONES  02/03/2022   Procedure: REMOVAL OF STONES;  Surgeon: Rollin Dover, MD;  Location: [facility] ENDOSCOPY;  Service: Gastroenterology;;   REVISON OF ARTERIOVENOUS FISTULA Left 08/20/2022   Procedure: REVISON OF LEFT ARM ARTERIOVENOUS FISTULA;  Surgeon: Serene Gaile ORN, MD;  [facility] OR;  Service: Vascular;  Laterality: Left;   RIGHT HEART CATH N/A 07/24/2017   Procedure: RIGHT HEART CATH;  Surgeon: Cherrie Toribio SAUNDERS, MD;  [facility];  Service: Cardiovascular;  Laterality: N/A;   RIGHT HEART CATHETERIZATION N/A 09/22/2013   Procedure: RIGHT HEART CATH;  Surgeon: Toribio SAUNDERS Cherrie, MD;  Location: [facility] CATH LAB;  Service: Cardiovascular;  Laterality: N/A;   SHOULDER ARTHROSCOPY WITH OPEN ROTATOR CUFF REPAIR Right 03/14/2014   Procedure: RIGHT SHOULDER ARTHROSCOPY WITH BICEPS RELEASE, OPEN SUBSCAPULA REPAIR, OPEN SUPRASPINATUS REPAIR.;  Surgeon: Cordella Glendia Hutchinson, MD;  Location: [facility] OR;  Service: Orthopedics;  Laterality: Right;   SPHINCTEROTOMY  02/03/2022   Procedure: SPHINCTEROTOMY;  Surgeon: Rollin Dover, MD;  Location: [facility] ENDOSCOPY;  Service: Gastroenterology;;   STUMP REVISION Right 06/13/2022   Procedure: REVISION RIGHT BELOW KNEE AMPUTATION;  Surgeon: Harden Jerona GAILS, MD;  Location: [facility] OR;  Service: Orthopedics;  Laterality: Right;   TEE WITHOUT CARDIOVERSION N/A 02/04/2022   Procedure: TRANSESOPHAGEAL ECHOCARDIOGRAM (TEE);  Surgeon: Cherrie Toribio SAUNDERS, MD;  Location: [facility] ENDOSCOPY;  Service: Cardiovascular;  Laterality: N/A;   THROMBECTOMY W/ EMBOLECTOMY Left 08/20/2022   Procedure: THROMBECTOMY OF LEFT ARM ARTERIOVENOUS FISTULA;  Surgeon: Serene Gaile ORN, MD;  Location: [facility] OR;  Service: Vascular;  Laterality: Left;   TOE AMPUTATION Right 02/24/2018   GREAT TOE AND 2ND TOE AMPUTATION   TUBAL LIGATION  1970's   UPPER EXTREMITY VENOGRAPHY N/A 07/31/2023   Procedure: UPPER EXTREMITY VENOGRAPHY;  Surgeon: Magda Debby SAILOR, MD;  [facility];  Service: Cardiovascular;  Laterality: N/A;   Family History  Problem Relation Age of Onset   Heart attack Mother 49   Social History:  reports that she quit smoking about 26 years ago. Her smoking use included cigarettes. She started smoking about 58 years ago. She has a 96 pack-year smoking history.  She has never used smokeless tobacco. She reports that she does not currently use alcohol. She reports that she does not use drugs.  ROS: As per HPI otherwise negative.   Physical Exam: Vitals:   12/18/23 1315 12/18/23 1330 12/18/23 1345 12/18/23 1402  BP: (!) 84/45 (!) 91/40 (!) 91/46 (!) 101/43  Pulse: 72 72 76 77  Resp: 13 12 19 15   Temp:      TempSrc:      SpO2: 95% 95% 95% 93%  Weight:      Height:         General: chronically ill and acutely ill elderly F NAD  HENT: NCAT pink mm anicteric sclera  Lungs: CTAb  Cardiovascular: rr Abdomen: soft round  Extremities: LLE wound with drainage, R  BKA  Neuro: Awake following commands, a little disoriented  GU: no foley  Dialysis access: R TDC in place; multiple failed AVF  Allergies  Allergen Reactions   Codeine Nausea And Vomiting   Keflex  [Cephalexin ] Diarrhea   Macrobid  [Nitrofurantoin ] Diarrhea and Nausea And Vomiting   Prior to Admission medications   Medication Sig Start Date End Date Taking? Authorizing Provider  acetaminophen  (TYLENOL ) 500 MG tablet Take 1,000 mg by mouth every 6 (six) hours as needed for moderate pain (pain score 4-6), fever or headache.   Yes [provider]  albuterol  (VENTOLIN  HFA) 108 (90 Base) MCG/ACT inhaler Inhale 1-2 puffs into the lungs every 6 (six) hours as needed for shortness of breath. 08/14/23  Yes Duanne Butler DASEN, MD  amiodarone  (PACERONE ) 200 MG tablet Take 1 tablet (200 mg total) by mouth daily. 08/14/23  Yes Duanne Butler DASEN, MD  apixaban  (ELIQUIS ) 5 MG TABS tablet Take 1 tablet (5 mg total) by mouth 2 (two) times daily. 08/14/23  Yes Duanne Butler DASEN, MD  Ascorbic Acid  (VITAMIN C  PO) Take 1 tablet by mouth daily.   Yes [provider]  atorvastatin  (LIPITOR ) 80 MG tablet Take 1 tablet (80 mg total) by mouth daily. 08/14/23  Yes Duanne Butler DASEN, MD  B Complex-C-Folic Acid  (DIALYVITE TABLET) TABS Take 1 tablet by mouth daily.   Yes [provider]   calcitRIOL  (ROCALTROL ) 0.25 MCG capsule Take 9 capsules (2.25 mcg total) by mouth Every Tuesday,Thursday,and Saturday with dialysis. Patient taking differently: Take 0.25 mcg by mouth daily. 08/15/23  Yes Dahal, Chapman, MD  clonazePAM  (KLONOPIN ) 0.5 MG disintegrating tablet Take 1 tablet (0.5 mg total) by mouth 2 (two) times daily. Stop lorazepam . 11/19/23  Yes Duanne Butler DASEN, MD  folic acid  (FOLVITE ) 1 MG tablet TAKE 1 TABLET BY MOUTH EVERY DAY 12/15/23  Yes Duanne Butler DASEN, MD  insulin  aspart (NOVOLOG ) 100 UNIT/ML injection Inject 100 Units into the skin at bedtime. Load insulin  pump with 100 units, use over night.   Yes [provider]  lactulose  (CHRONULAC ) 10 GM/15ML solution Take 20 g by mouth daily as needed (constipation).   Yes [provider]  MELATONIN PO Take 1 tablet by mouth at bedtime.   Yes [provider]  methocarbamol  (ROBAXIN ) 750 MG tablet TAKE 1 TABLET (750 MG TOTAL) BY MOUTH EVERY 6 (SIX) HOURS AS NEEDED FOR MUSCLE SPASMS 12/01/23  Yes Duanne Butler DASEN, MD  Methoxy PEG-Epoetin  Beta (MIRCERA IJ) Mircera 02/05/23 02/04/24 Yes [provider]  midodrine  (PROAMATINE ) 10 MG tablet Take 1-2 tablets (10-20 mg total) by mouth Every Tuesday,Thursday,and Saturday with dialysis. 08/15/23  Yes Duanne Butler DASEN, MD  Multiple Vitamins-Minerals (ZINC  PO) Take 1 tablet by mouth daily.   Yes [provider]  nitroGLYCERIN  (NITROSTAT ) 0.4 MG SL tablet Place 1 tablet (0.4 mg total) under the tongue every 5 (five) minutes as needed. 08/14/23 01/30/24 Yes Duanne Butler DASEN, MD  nystatin  cream (MYCOSTATIN ) Apply 1 Application topically 2 (two) times daily.   Yes [provider]  oxyCODONE -acetaminophen  (PERCOCET/ROXICET) 5-325 MG tablet Take 1 tablet by mouth every 6 (six) hours as needed for severe pain (pain score 7-10).   Yes [provider]  OXYGEN  Inhale 2 L/min into the lungs at bedtime.   Yes [provider]  pantoprazole   (PROTONIX ) 40 MG tablet Take 1 tablet (40 mg total) by mouth daily. 08/14/23  Yes Duanne Butler DASEN, MD  polyethylene glycol (MIRALAX  / GLYCOLAX ) 17 g packet Take 17 g  by mouth daily. Patient taking differently: Take 17 g by mouth daily as needed (constipation). 12/10/22  Yes Angiulli, Toribio PARAS, PA-C  pregabalin  (LYRICA ) 150 MG capsule Take 150 mg by mouth 2 (two) times daily. 11/20/23  Yes [provider]  sevelamer  carbonate (RENVELA ) 800 MG tablet Take 800-1,600 mg by mouth See admin instructions. Take 2 tablets (1600mg ) by mouth three times daily with meals and take 1 tablet (800mg ) daily as needed for snacks.   Yes [provider]  venlafaxine  XR (EFFEXOR -XR) 75 MG 24 hr capsule Take 1 capsule (75 mg total) by mouth daily with breakfast. Stop trintellix  08/14/23  Yes Duanne Butler DASEN, MD  Insulin  Disposable Pump (OMNIPOD 5 [id number] PODS GEN 5) MISC Inject 1 Application into the skin 3 days. 12/13/23   [provider]  insulin  glargine (LANTUS  SOLOSTAR) 100 UNIT/ML Solostar Pen Inject 40-44 Units into the skin at bedtime as needed (insulin  pump malfunction). Patient not taking: Reported on 12/18/2023    [provider]  levothyroxine  (SYNTHROID ) 50 MCG tablet Take 1 tablet (50 mcg total) by mouth daily before breakfast. 08/14/23   Duanne Butler DASEN, MD  nitrofurantoin , macrocrystal-monohydrate, (MACROBID ) 100 MG capsule Take 1 capsule (100 mg total) by mouth daily. Patient not taking: Reported on 12/18/2023 12/11/23   Aguiar, Rafaela, MD  ofloxacin (OCUFLOX) 0.3 % ophthalmic solution Place 1 drop into the left eye 4 (four) times daily. Patient not taking: Reported on 12/18/2023 12/13/23   [provider]  prednisoLONE acetate (PRED FORTE) 1 % ophthalmic suspension 1 drop 4 (four) times daily. Patient not taking: Reported on 12/18/2023    [provider]  sulfamethoxazole -trimethoprim  (BACTRIM  DS) 800-160 MG tablet Take 1 tablet by mouth 2 (two) times  daily. Patient not taking: Reported on 12/18/2023 12/14/23   Duanne Butler DASEN, MD   Current Facility-Administered Medications  Medication Dose Route Frequency Provider Last Rate Last Admin   0.9 %  sodium chloride  infusion  250 mL Intravenous Continuous Lola Massie LABOR, RPH   Held at 12/17/23 8361   albumin  human 25 % solution 25 g  25 g Intravenous Q6H Kara Dorn NOVAK, MD   Stopped at 12/18/23 1008   amiodarone  (PACERONE ) tablet 200 mg  200 mg Oral Daily Bowser, Grace E, NP   200 mg at 12/18/23 0904   [START ON 12/19/2023] calcitRIOL  (ROCALTROL ) capsule 2.25 mcg  2.25 mcg Oral Q T,Th,Sa-HD Bowser, Ronnald BRAVO, NP       Chlorhexidine  Gluconate Cloth 2 % PADS 6 each  6 each Topical Daily Kara Dorn NOVAK, MD   6 each at 12/18/23 1248   [START ON 12/20/2023] collagenase  (SANTYL ) ointment   Topical Daily Kara Dorn NOVAK, MD       docusate sodium  (COLACE) capsule 100 mg  100 mg Oral BID PRN Daren Ronnald BRAVO, NP       heparin  ADULT infusion 100 units/mL (25000 units/250mL)  1,400 Units/hr Intravenous Continuous Wilhemena Maurilio CROME, RPH 14 mL/hr at 12/18/23 1400 1,400 Units/hr at 12/18/23 1400   insulin  aspart (novoLOG ) injection 0-15 Units  0-15 Units Subcutaneous Q4H Daren Ronnald BRAVO, NP   5 Units at 12/18/23 1157   insulin  glargine-yfgn (SEMGLEE ) injection 10 Units  10 Units Subcutaneous Daily Daren Ronnald BRAVO, NP   10 Units at 12/18/23 1013   levothyroxine  (SYNTHROID ) tablet 50 mcg  50 mcg Oral Q0600 Daren Ronnald BRAVO, NP   50 mcg at 12/18/23 0615   liver oil-zinc  oxide (DESITIN) 40 % ointment   Topical  BID Kara Dorn NOVAK, MD   Given at 12/18/23 1013   meropenem  (MERREM ) 1 g in sodium chloride  0.9 % 100 mL IVPB  1 g Intravenous Q24H Lola Massie LABOR, RPH   Stopped at 12/17/23 1913   methocarbamol  (ROBAXIN ) tablet 750 mg  750 mg Oral Q6H PRN Daren Ronnald BRAVO, NP   750 mg at 12/18/23 9095   midodrine  (PROAMATINE ) tablet 10 mg  10 mg Oral TID WC Armas Mcbee, Belvie DEL, NP       norepinephrine  (LEVOPHED ) 4mg   in (0.016 mg/mL) premix infusion  0-10 mcg/min Intravenous Titrated Daren Ronnald BRAVO, NP 7.5 mL/hr at 12/18/23 1400 2 mcg/min at 12/18/23 1400   Oral care mouth rinse  15 mL Mouth Rinse PRN Kara Dorn NOVAK, MD       polyethylene glycol (MIRALAX  / GLYCOLAX ) packet 17 g  17 g Oral Daily PRN Daren Ronnald BRAVO, NP       pregabalin  (LYRICA ) capsule 150 mg  150 mg Oral BID Daren Ronnald BRAVO, NP   150 mg at 12/18/23 9095   sodium hypochlorite (DAKIN'S 1/4 STRENGTH) topical solution   Irrigation Daily Kara Dorn NOVAK, MD   Given at 12/18/23 1013   Labs: Basic Metabolic Panel: Recent Labs  Lab 12/17/23 1145 12/18/23 0356  NA 132* 132*  K 3.8 4.3  CL 90* 90*  CO2 24 20*  GLUCOSE 238* 235*  BUN 72* 73*  CREATININE 6.28* 6.53*  CALCIUM  9.0 8.9   Liver Function Tests: Recent Labs  Lab 12/17/23 1145  AST 28  ALT 22  ALKPHOS 238*  BILITOT 1.1  PROT 6.5  ALBUMIN  2.3*   No results for input(s): LIPASE, AMYLASE in the last 168 hours. No results for input(s): AMMONIA in the last 168 hours. CBC: Recent Labs  Lab 12/17/23 1145 12/18/23 0356  WBC 23.6* 23.1*  NEUTROABS 21.7*  --   HGB 9.9* 10.1*  HCT 32.0* 33.2*  MCV 87.2 86.9  PLT 288 345   Cardiac Enzymes: No results for input(s): CKTOTAL, CKMB, CKMBINDEX, TROPONINI in the last 168 hours. CBG: Recent Labs  Lab 12/17/23 1932 12/17/23 2334 12/18/23 0327 12/18/23 0808 12/18/23 1147  GLUCAP 206* 233* 206* 190* 234*   Iron  Studies: No results for input(s): IRON , TIBC, TRANSFERRIN, FERRITIN in the last 72 hours. Studies/Results: CT ABDOMEN PELVIS WO CONTRAST Result Date: 12/17/2023 CLINICAL DATA:  Decubitus wounds. EXAM: CT ABDOMEN AND PELVIS WITHOUT CONTRAST TECHNIQUE: Multidetector CT imaging of the abdomen and pelvis was performed following the standard protocol without IV contrast. RADIATION DOSE REDUCTION: This exam was performed according to the departmental dose-optimization program which  includes automated exposure control, adjustment of the mA and/or kV according to patient size and/or use of iterative reconstruction technique. COMPARISON:  Oct 01, 2023. FINDINGS: Lower chest: No acute abnormality. Hepatobiliary: Cholelithiasis. No biliary dilatation. Liver is unremarkable. Pancreas: Unremarkable. No pancreatic ductal dilatation or surrounding inflammatory changes. Spleen: Multiple small calcified splenic granulomas are noted. Adrenals/Urinary Tract: Adrenal glands appear normal. Bilateral renal atrophy is noted consistent with end-stage renal disease. No hydronephrosis or renal obstruction is noted. Urinary bladder is unremarkable. Stomach/Bowel: Stomach is within normal limits. Appendix appears normal. No evidence of bowel wall thickening, distention, or inflammatory changes. Moderate amount of stool is seen in the sigmoid colon and rectum. Vascular/Lymphatic: Aortic atherosclerosis. No enlarged abdominal or pelvic lymph nodes. Reproductive: Status post hysterectomy. No adnexal masses. Other: No ascites or hernia is noted. Large sacral decubitus ulceration or wound is noted, but does not appear  to extend to underlying bone. Musculoskeletal: No acute or significant osseous findings. IMPRESSION: Interval development of large sacral decubitus ulceration or wound is noted described above. Cholelithiasis. Bilateral renal atrophy consistent with history of end-stage renal disease. Moderate amount of stool is seen in sigmoid colon and rectum concerning for possible impaction. Aortic Atherosclerosis (ICD10-I70.0). Electronically Signed   By: Lynwood Landy Raddle M.D.   On: 12/17/2023 11:48   DG Chest Port 1 View Result Date: 12/17/2023 CLINICAL DATA:  . questionable sepsis.  Left large ache.  Confusion EXAM: PORTABLE CHEST 1 VIEW COMPARISON:  Chest x-ray 10/01/2023 FINDINGS: Underinflation. Stable cardiopericardial silhouette with calcified aorta. Bronchovascular crowding. No consolidation, pneumothorax or  effusion. Double-lumen right IJ catheter with tip overlying the SVC right atrial junction region. Overlapping cardiac leads. Degenerative changes along the spine. IMPRESSION: Underinflation. Bronchovascular crowding. Double-lumen right IJ catheter. No consolidation. Electronically Signed   By: Ranell Bring M.D.   On: 12/17/2023 11:42    Dialysis Orders:  [facility] TTS 4 hr - last HD 12/15/23 400 QB/ Manual 800 DFR 2K/2 Ca bath Heparin  4000 U bolus EDW 77.4 Mircera 100 mcg last given 12/01/23 Venofer  100 mg q treatment; last given 12/15/23 Calcitriol  2.5 mcg every treatment Sensipar 30 mg q treatment  Assessment/Plan: Septic shock: unclear if UTI vs cellulitius. Pt with hx of enterococcus bacteremia. Reported recent UTI and staff reports green urine. On Merrem  1g q 24 hours. On low dose NE for BP support with midodrine .   ESRD:  TTS schedule at [facility]. Will support her with HD while she is in the hospital.   Hypertension/volume: On low dose vasopressors. Plan to wean off so she can have HD up on unit. Increased her midodrine  10 mg TID; her home dose in 5 mg  Anemia: On mircera at OP HD. Last dose 12/01/23  Metabolic bone disease: On calcitriol  2.5 mcg at treatment and sensipar 30 mg at treatment. Checking updated RFP and PTH with tomorrow's labs and will reorder meds.   Nutrition:  On heart healthy diet with 1200 mL fluid restriction PAF: On heparin  gtt while in hospital. Patient on eliquis  at home DM2: continue SSI per primary  Belvie Och, NP-C 12/18/2023, 2:15 PM  [facility]

## 2023-12-18 NOTE — Consult Note (Signed)
 Susan [name] 1951/03/30  [id number].    Requesting Provider: Ronnald Gave, NP Chief Complaint/Reason for Consult: Sacral Decub wound   HPI: Susan [name] is a 73 y.o. female with PMHx of ESRD on HD, DM2 on insulin , CAD, HFrEF, pAF, decub ulcers POA, hx enterococcus bacteremia who presented to [facility] ED on 7/24 for concerns regarding wounds. She apparently had some increased drainage from her sacral decubital wound while home. Patient was then admitted to CCM at [name]. General surgery was consulted on for assessment of her sacral wound. Upon arrival, patient had concerns regarding being placed on mechanical ventilation which were addressed by CCM NP. Patient had complains of shoulder pain during movement for said assessment. Husband at bedside.   ROS: As per HPI   Family History  Problem Relation Age of Onset   Heart attack Mother 74    Past Medical History:  Diagnosis Date   Allergy    Anemia    hx   Anxiety    Arthritis    generalized (03/15/2014)   CAD (coronary artery disease)    MI in 2000 - MI  2007 - treated bare metal stent (no nuclear since then as 9/11)   Carotid artery disease (HCC)    CHF (congestive heart failure) (HCC)    Chronic diastolic heart failure (HCC)    a) ECHO (08/2013) EF 55-60% and RV function nl b) RHC (08/2013) RA 4, RV 30/5/7, PA 25/10 (16), PCWP 7, Fick CO/CI 6.3/2.7, PVR 1.5 WU, PA 61 and 66%   Clotting disorder (HCC)    Daily headache    ~ every other day; since I fell in June (03/15/2014)   Depression    Diabetic retinopathy (HCC)    Dyslipidemia    ESRD (end stage renal disease) (HCC)    Dialysis on Tues Thurs Sat   Exertional shortness of breath    GERD (gastroesophageal reflux disease)    History of blood transfusion    History of kidney stones    HTN (hypertension)    Hypothyroidism    Myocardial infarction (HCC)    Obesity    Osteoarthritis    Oxygen  deficiency    PAF (paroxysmal atrial fibrillation) (HCC)    Peripheral  neuropathy    bilateral feet/hands   PONV (postoperative nausea and vomiting)    RBBB (right bundle branch block)    Old   Stroke (HCC)    mini strokes   Type II diabetes mellitus (HCC)    Type II, Barnet libre left upper arm. patient has omnipod insulin  pump with Novolin R Insulin     Past Surgical History:  Procedure Laterality Date   A/V FISTULAGRAM Left 11/07/2022   Procedure: A/V Fistulagram;  Surgeon: Magda Debby SAILOR, MD;  [facility];  Service: Cardiovascular;  Laterality: Left;   ABDOMINAL HYSTERECTOMY  1980's   AMPUTATION Right 02/24/2018   Procedure: RIGHT FOOT GREAT TOE AND 2ND TOE AMPUTATION;  Surgeon: Harden Jerona GAILS, MD;  Location: [facility] OR;  Service: Orthopedics;  Laterality: Right;   AMPUTATION Right 04/30/2018   Procedure: RIGHT TRANSMETATARSAL AMPUTATION;  Surgeon: Harden Jerona GAILS, MD;  Location: [facility] OR;  Service: Orthopedics;  Laterality: Right;   AMPUTATION Right 05/02/2022   Procedure: RIGHT BELOW KNEE AMPUTATION;  Surgeon: Harden Jerona GAILS, MD;  Location: [facility] OR;  Service: Orthopedics;  Laterality: Right;   APPLICATION OF WOUND VAC Right 06/13/2022   Procedure: APPLICATION OF WOUND VAC;  Surgeon: Harden Jerona GAILS, MD;  Location:  [facility] OR;  Service: Orthopedics;  Laterality: Right;   APPLICATION OF WOUND VAC Left 11/14/2022   Procedure: APPLICATION OF WOUND VAC;  Surgeon: Harden Jerona GAILS, MD;  Location: [facility] OR;  Service: Orthopedics;  Laterality: Left;   AV FISTULA PLACEMENT Left 04/02/2022   Procedure: LEFT ARM ARTERIOVENOUS (AV) FISTULA CREATION;  Surgeon: Serene Gaile ORN, MD;  [facility];  Service: Vascular;  Laterality: Left;  PERIPHERAL NERVE BLOCK   AV FISTULA PLACEMENT Right 04/27/2023   Procedure: RIGHT ARM BRACHIOBASILIC ARTERIOVENOUS (AV) FISTULA CREATION;  Surgeon: Magda Debby SAILOR, MD;  [facility]: [facility] OR;  Service: Vascular;  Laterality: Right;   BASCILIC VEIN TRANSPOSITION Left 07/31/2022   Procedure: LEFT ARM SECOND STAGE BASILIC VEIN TRANSPOSITION;   Surgeon: Serene Gaile ORN, MD;  [facility] OR;  Service: Vascular;  Laterality: Left;   BIOPSY  05/27/2020   Procedure: BIOPSY;  Surgeon: Cindie Carlin POUR, DO;  Location: [facility];  Service: Endoscopy;;   CATARACT EXTRACTION, BILATERAL Bilateral ?2013   COLONOSCOPY W/ POLYPECTOMY     COLONOSCOPY WITH PROPOFOL  N/A 03/13/2019   Procedure: COLONOSCOPY WITH PROPOFOL ;  Surgeon: Albertus Gordy HERO, MD;  Location: [name] ENDOSCOPY;  Service: Gastroenterology;  Laterality: N/A;   CORONARY ANGIOPLASTY WITH STENT PLACEMENT  1999; 2007   1 + 1   ERCP N/A 02/03/2022   Procedure: ENDOSCOPIC RETROGRADE CHOLANGIOPANCREATOGRAPHY (ERCP);  Surgeon: Rollin [facility], MD;  Location: [facility] ENDOSCOPY;  Service: Gastroenterology;  Laterality: N/A;   ESOPHAGOGASTRODUODENOSCOPY N/A 02/12/2023   Procedure: ESOPHAGOGASTRODUODENOSCOPY (EGD);  Surgeon: Rollin [facility], MD;  Location: [facility] ENDOSCOPY;  Service: Gastroenterology;  Laterality: N/A;   ESOPHAGOGASTRODUODENOSCOPY (EGD) WITH PROPOFOL  N/A 03/13/2019   Procedure: ESOPHAGOGASTRODUODENOSCOPY (EGD) WITH PROPOFOL ;  Surgeon: Albertus Gordy HERO, MD;  Location: [facility] ENDOSCOPY;  Service: Gastroenterology;  Laterality: N/A;   ESOPHAGOGASTRODUODENOSCOPY (EGD) WITH PROPOFOL  N/A 05/27/2020   Procedure: ESOPHAGOGASTRODUODENOSCOPY (EGD) WITH PROPOFOL ;  Surgeon: Cindie Carlin POUR, DO;  Location: [facility];  Service: Endoscopy;  Laterality: N/A;   ESOPHAGOGASTRODUODENOSCOPY (EGD) WITH PROPOFOL  N/A 09/03/2022   Procedure: ESOPHAGOGASTRODUODENOSCOPY (EGD) WITH PROPOFOL ;  Surgeon: Rollin [facility], MD;  Location: [facility] ENDOSCOPY;  Service: Gastroenterology;  Laterality: N/A;   EYE SURGERY Bilateral    lazer   FLEXIBLE SIGMOIDOSCOPY N/A 05/23/2022   Procedure: FLEXIBLE SIGMOIDOSCOPY;  Surgeon: Rollin [facility], MD;  Location: [facility] ENDOSCOPY;  Service: Gastroenterology;  Laterality: N/A;   FLEXIBLE SIGMOIDOSCOPY N/A 05/24/2022   Procedure: FLEXIBLE SIGMOIDOSCOPY;  Surgeon: Federico Rosario BROCKS, MD;  Location: [name]  ENDOSCOPY;  Service: Gastroenterology;  Laterality: N/A;   FLEXIBLE SIGMOIDOSCOPY N/A 09/03/2022   Procedure: FLEXIBLE SIGMOIDOSCOPY;  Surgeon: Rollin [facility], MD;  Location: [facility] ENDOSCOPY;  Service: Gastroenterology;  Laterality: N/A;   HEMOSTASIS CLIP PLACEMENT  03/13/2019   Procedure: HEMOSTASIS CLIP PLACEMENT;  Surgeon: Albertus Gordy HERO, MD;  Location: [facility] ENDOSCOPY;  Service: Gastroenterology;;   HEMOSTASIS CLIP PLACEMENT  05/23/2022   Procedure: HEMOSTASIS CLIP PLACEMENT;  Surgeon: Rollin [facility], MD;  Location: [facility] ENDOSCOPY;  Service: Gastroenterology;;   HEMOSTASIS CONTROL  05/24/2022   Procedure: HEMOSTASIS CONTROL;  Surgeon: Federico Rosario BROCKS, MD;  Location: [name];  Service: Gastroenterology;;   HOT HEMOSTASIS N/A 05/23/2022   Procedure: HOT HEMOSTASIS (ARGON PLASMA COAGULATION/BICAP);  Surgeon: Rollin [facility], MD;  Location: [facility] ENDOSCOPY;  Service: Gastroenterology;  Laterality: N/A;   I & D EXTREMITY Left 05/05/2022   Procedure: IRRIGATION AND DEBRIDEMENT LEFT ARM AV FISTULA;  Surgeon: Gretta Lonni PARAS, MD;  Location: [facility] OR;  Service: Vascular;  Laterality: Left;   I & D  EXTREMITY N/A 11/14/2022   Procedure: IRRIGATION AND DEBRIDEMENT OF LOWER EXTREMITY WOUND;  Surgeon: Harden Jerona GAILS, MD;  Location: [facility] OR;  Service: Orthopedics;  Laterality: N/A;   INSERTION OF DIALYSIS CATHETER Right 04/02/2022   Procedure: INSERTION OF TUNNELED DIALYSIS CATHETER;  Surgeon: Serene Gaile ORN, MD;  [facility];  Service: Vascular;  Laterality: Right;   IR FLUORO GUIDE CV LINE RIGHT  08/10/2023   IR VENOCAVAGRAM IVC  08/10/2023   KNEE ARTHROSCOPY Left 10/25/2006   POLYPECTOMY  03/13/2019   Procedure: POLYPECTOMY;  Surgeon: Albertus Gordy HERO, MD;  Location: [facility] ENDOSCOPY;  Service: Gastroenterology;;   REMOVAL OF STONES  02/03/2022   Procedure: REMOVAL OF STONES;  Surgeon: Rollin [facility], MD;  Location: [facility] ENDOSCOPY;  Service: Gastroenterology;;   REVISON OF ARTERIOVENOUS FISTULA Left 08/20/2022    Procedure: REVISON OF LEFT ARM ARTERIOVENOUS FISTULA;  Surgeon: Serene Gaile ORN, MD;  [facility];  Service: Vascular;  Laterality: Left;   RIGHT HEART CATH N/A 07/24/2017   Procedure: RIGHT HEART CATH;  Surgeon: Cherrie Toribio SAUNDERS, MD;  [facility];  Service: Cardiovascular;  Laterality: N/A;   RIGHT HEART CATHETERIZATION N/A 09/22/2013   Procedure: RIGHT HEART CATH;  Surgeon: Toribio SAUNDERS Cherrie, MD;  Location: [facility] CATH LAB;  Service: Cardiovascular;  Laterality: N/A;   SHOULDER ARTHROSCOPY WITH OPEN ROTATOR CUFF REPAIR Right 03/14/2014   Procedure: RIGHT SHOULDER ARTHROSCOPY WITH BICEPS RELEASE, OPEN SUBSCAPULA REPAIR, OPEN SUPRASPINATUS REPAIR.;  Surgeon: Cordella Glendia Hutchinson, MD;  Location: [facility] OR;  Service: Orthopedics;  Laterality: Right;   SPHINCTEROTOMY  02/03/2022   Procedure: SPHINCTEROTOMY;  Surgeon: Rollin [facility], MD;  Location[name]COPY;  Service: Gastroenterology;;   STUMP REVISION Right 06/13/2022   Procedure: REVISION RIGHT BELOW KNEE AMPUTATION;  Surgeon: Harden Jerona GAILS, MD;  Location: [name];  Service: Orthopedics;  Laterality: Right;   TEE WITHOUT CARDIOVERSION N/A 02/04/2022   Procedure: TRANSESOPHAGEAL ECHOCARDIOGRAM (TEE);  Surgeon: Cherrie Toribio SAUNDERS, MD;  Location: [facility] ENDOSCOPY;  Service: Cardiovascular;  Laterality: N/A;   THROMBECTOMY W/ EMBOLECTOMY Left 08/20/2022   Procedure: THROMBECTOMY OF LEFT ARM ARTERIOVENOUS FISTULA;  Surgeon: Serene Gaile ORN, MD;  Location: [name] OR;  Service: Vascular;  Laterality: Left;   TOE AMPUTATION Right 02/24/2018   GREAT TOE AND 2ND TOE AMPUTATION   TUBAL LIGATION  1970's   UPPER EXTREMITY VENOGRAPHY N/A 07/31/2023   Procedure: UPPER EXTREMITY VENOGRAPHY;  Surgeon: Magda Debby SAILOR, MD;  [facility];  Service: Cardiovascular;  Laterality: N/A;    Social History:  reports that she quit smoking about 26 years ago. Her smoking use included cigarettes. She started smoking about 58 years ago. She has a 96  pack-year smoking history. She has never used smokeless tobacco. She reports that she does not currently use alcohol. She reports that she does not use drugs.  Allergies:  Allergies  Allergen Reactions   Codeine Nausea And Vomiting   Keflex  [Cephalexin ] Diarrhea   Macrobid  [Nitrofurantoin ] Diarrhea and Nausea And Vomiting    Medications Prior to Admission  Medication Sig Dispense Refill   acetaminophen  (TYLENOL ) 500 MG tablet Take 1,000 mg by mouth every 6 (six) hours as needed for moderate pain (pain score 4-6), fever or headache.     albuterol  (VENTOLIN  HFA) 108 (90 Base) MCG/ACT inhaler Inhale 1-2 puffs into the lungs every 6 (six) hours as needed for shortness of breath. 1 each 3   amiodarone  (PACERONE ) 200 MG tablet Take 1 tablet (200 mg total) by  mouth daily. 90 tablet 3   apixaban  (ELIQUIS ) 5 MG TABS tablet Take 1 tablet (5 mg total) by mouth 2 (two) times daily. 180 tablet 1   Ascorbic Acid  (VITAMIN C  PO) Take 1 tablet by mouth daily.     atorvastatin  (LIPITOR ) 80 MG tablet Take 1 tablet (80 mg total) by mouth daily. 90 tablet 1   B Complex-C-Folic Acid  (DIALYVITE TABLET) TABS Take 1 tablet by mouth daily.     calcitRIOL  (ROCALTROL ) 0.25 MCG capsule Take 9 capsules (2.25 mcg total) by mouth Every Tuesday,Thursday,and Saturday with dialysis. (Patient taking differently: Take 0.25 mcg by mouth daily.) 30 capsule 0   clonazePAM  (KLONOPIN ) 0.5 MG disintegrating tablet Take 1 tablet (0.5 mg total) by mouth 2 (two) times daily. Stop lorazepam . 60 tablet 1   folic acid  (FOLVITE ) 1 MG tablet TAKE 1 TABLET BY MOUTH EVERY DAY 90 tablet 1   insulin  aspart (NOVOLOG ) 100 UNIT/ML injection Inject 100 Units into the skin at bedtime. Load insulin  pump with 100 units, use over night.     lactulose  (CHRONULAC ) 10 GM/15ML solution Take 20 g by mouth daily as needed (constipation).     MELATONIN PO Take 1 tablet by mouth at bedtime.     methocarbamol  (ROBAXIN ) 750 MG tablet TAKE 1 TABLET (750 MG TOTAL)  BY MOUTH EVERY 6 (SIX) HOURS AS NEEDED FOR MUSCLE SPASMS 60 tablet 0   Methoxy PEG-Epoetin  Beta (MIRCERA IJ) Mircera     midodrine  (PROAMATINE ) 10 MG tablet Take 1-2 tablets (10-20 mg total) by mouth Every Tuesday,Thursday,and Saturday with dialysis. 30 tablet 1   Multiple Vitamins-Minerals (ZINC  PO) Take 1 tablet by mouth daily.     nitroGLYCERIN  (NITROSTAT ) 0.4 MG SL tablet Place 1 tablet (0.4 mg total) under the tongue every 5 (five) minutes as needed. 25 tablet 3   nystatin  cream (MYCOSTATIN ) Apply 1 Application topically 2 (two) times daily.     oxyCODONE -acetaminophen  (PERCOCET/ROXICET) 5-325 MG tablet Take 1 tablet by mouth every 6 (six) hours as needed for severe pain (pain score 7-10).     OXYGEN  Inhale 2 L/min into the lungs at bedtime.     pantoprazole  (PROTONIX ) 40 MG tablet Take 1 tablet (40 mg total) by mouth daily. 180 tablet 1   polyethylene glycol (MIRALAX  / GLYCOLAX ) 17 g packet Take 17 g by mouth daily. (Patient taking differently: Take 17 g by mouth daily as needed (constipation).)     pregabalin  (LYRICA ) 150 MG capsule Take 150 mg by mouth 2 (two) times daily.     sevelamer  carbonate (RENVELA ) 800 MG tablet Take 800-1,600 mg by mouth See admin instructions. Take 2 tablets (1600mg ) by mouth three times daily with meals and take 1 tablet (800mg ) daily as needed for snacks.     venlafaxine  XR (EFFEXOR -XR) 75 MG 24 hr capsule Take 1 capsule (75 mg total) by mouth daily with breakfast. Stop trintellix  90 capsule 1   Insulin  Disposable Pump (OMNIPOD 5 [id number] PODS GEN 5) MISC Inject 1 Application into the skin 3 days.     insulin  glargine (LANTUS  SOLOSTAR) 100 UNIT/ML Solostar Pen Inject 40-44 Units into the skin at bedtime as needed (insulin  pump malfunction). (Patient not taking: Reported on 12/18/2023)     levothyroxine  (SYNTHROID ) 50 MCG tablet Take 1 tablet (50 mcg total) by mouth daily before breakfast. 90 tablet 1   nitrofurantoin , macrocrystal-monohydrate, (MACROBID ) 100 MG  capsule Take 1 capsule (100 mg total) by mouth daily. (Patient not taking: Reported on 12/18/2023) 7 capsule 0  ofloxacin (OCUFLOX) 0.3 % ophthalmic solution Place 1 drop into the left eye 4 (four) times daily. (Patient not taking: Reported on 12/18/2023)     prednisoLONE acetate (PRED FORTE) 1 % ophthalmic suspension 1 drop 4 (four) times daily. (Patient not taking: Reported on 12/18/2023)     sulfamethoxazole -trimethoprim  (BACTRIM  DS) 800-160 MG tablet Take 1 tablet by mouth 2 (two) times daily. (Patient not taking: Reported on 12/18/2023) 14 tablet 0     Physical Exam: Blood pressure 101/71, pulse 79, temperature 98.2 F (36.8 C), temperature source Oral, resp. rate 20, height 5' 5 (1.651 m), weight 77.9 kg, SpO2 100%.  Constitutional:      General: She is not in acute distress.    Appearance: She is ill-appearing.  Pulmonary:     Effort: Pulmonary effort is normal.  Skin:    General: Skin is warm and dry.     Comments: warm and dry with no masses, lesions, or rashes.  This sacral wound is overall clean. There is no necrotic tissue present. There is some pale pink/yellow tissue from adipose and granulation tissue. There is some purulent drainage and malodor present.   Neurological:     General: No focal deficit present.    Results for orders placed or performed during the [name] encounter of 12/17/23 (from the past 48 hours)  POC CBG, ED     Status: Abnormal   Collection Time: 12/17/23 10:35 AM  Result Value Ref Range   Glucose-Capillary 297 (H) 70 - 99 mg/dL    Comment: Glucose reference range applies only to samples taken after fasting for at least 8 hours.  Resp panel by RT-PCR (RSV, Flu A&B, Covid) Anterior Nasal Swab     Status: None   Collection Time: 12/17/23 11:28 AM   Specimen: Anterior Nasal Swab  Result Value Ref Range   SARS Coronavirus 2 by RT PCR NEGATIVE NEGATIVE    Comment: (NOTE) SARS-CoV-2 target nucleic acids are NOT DETECTED.  The SARS-CoV-2 RNA is  generally detectable in upper respiratory specimens during the acute phase of infection. The lowest concentration of SARS-CoV-2 viral copies this assay can detect is 138 copies/mL. A negative result does not preclude SARS-Cov-2 infection and should not be used as the sole basis for treatment or other patient management decisions. A negative result may occur with  improper specimen collection/handling, submission of specimen other than nasopharyngeal swab, presence of viral mutation(s) within the areas targeted by this assay, and inadequate number of viral copies(<138 copies/mL). A negative result must be combined with clinical observations, patient history, and epidemiological information. The expected result is Negative.  Fact Sheet for Patients:  BloggerCourse.com  Fact Sheet for Healthcare Providers:  SeriousBroker.it  This test is no t yet approved or cleared by the [place]  FDA and  has been authorized for detection and/or diagnosis of SARS-CoV-2 by FDA under an Emergency Use Authorization (EUA). This EUA will remain  in effect (meaning this test can be used) for the duration of the COVID-19 declaration under Section 564(b)(1) of the Act, 21 U.S.C.section 360bbb-3(b)(1), unless the authorization is terminated  or revoked sooner.       Influenza A by PCR NEGATIVE NEGATIVE   Influenza B by PCR NEGATIVE NEGATIVE    Comment: (NOTE) The Xpert Xpress SARS-CoV-2/FLU/RSV plus assay is intended as an aid in the diagnosis of influenza from Nasopharyngeal swab specimens and should not be used as a sole basis for treatment. Nasal washings and aspirates are unacceptable for Xpert Xpress SARS-CoV-2/FLU/RSV testing.  Fact Sheet for Patients: BloggerCourse.com  Fact Sheet for Healthcare Providers: SeriousBroker.it  This test is not yet approved or cleared by the [place]  FDA  and has been authorized for detection and/or diagnosis of SARS-CoV-2 by FDA under an Emergency Use Authorization (EUA). This EUA will remain in effect (meaning this test can be used) for the duration of the COVID-19 declaration under Section 564(b)(1) of the Act, 21 U.S.C. section 360bbb-3(b)(1), unless the authorization is terminated or revoked.     Resp Syncytial Virus by PCR NEGATIVE NEGATIVE    Comment: (NOTE) Fact Sheet for Patients: BloggerCourse.com  Fact Sheet for Healthcare Providers: SeriousBroker.it  This test is not yet approved or cleared by the [place]  FDA and has been authorized for detection and/or diagnosis of SARS-CoV-2 by FDA under an Emergency Use Authorization (EUA). This EUA will remain in effect (meaning this test can be used) for the duration of the COVID-19 declaration under Section 564(b)(1) of the Act, 21 U.S.C. section 360bbb-3(b)(1), unless the authorization is terminated or revoked.  Performed at [facility], [address], [place], [place] [address]   Lactic acid, plasma     Status: Abnormal   Collection Time: 12/17/23 11:45 AM  Result Value Ref Range   Lactic Acid, Venous 2.0 (HH) 0.5 - 1.9 mmol/L    Comment: CRITICAL RESULT CALLED TO, READ BACK BY AND VERIFIED WITH E GANT RN [phone] K FORSYTH Performed at [facility], [address], [place], [place] [address]   Comprehensive metabolic panel     Status: Abnormal   Collection Time: 12/17/23 11:45 AM  Result Value Ref Range   Sodium 132 (L) 135 - 145 mmol/L   Potassium 3.8 3.5 - 5.1 mmol/L   Chloride 90 (L) 98 - 111 mmol/L   CO2 24 22 - 32 mmol/L   Glucose, Bld 238 (H) 70 - 99 mg/dL    Comment: Glucose reference range applies only to samples taken after fasting for at least 8 hours.   BUN 72 (H) 8 - 23 mg/dL   Creatinine, Ser 3.71 (H) 0.44 - 1.00 mg/dL   Calcium  9.0 8.9 - 10.3 mg/dL   Total Protein 6.5 6.5 - 8.1 g/dL   Albumin  2.3  (L) 3.5 - 5.0 g/dL   AST 28 15 - 41 U/L   ALT 22 0 - 44 U/L   Alkaline Phosphatase 238 (H) 38 - 126 U/L   Total Bilirubin 1.1 0.0 - 1.2 mg/dL   GFR, Estimated 7 (L) >60 mL/min    Comment: (NOTE) Calculated using the CKD-EPI Creatinine Equation (2021)    Anion gap 18 (H) 5 - 15    Comment: Performed at [facility], [address], [place], [place] [address]  CBC with Differential     Status: Abnormal   Collection Time: 12/17/23 11:45 AM  Result Value Ref Range   WBC 23.6 (H) 4.0 - 10.5 K/uL   RBC 3.67 (L) 3.87 - 5.11 MIL/uL   Hemoglobin 9.9 (L) 12.0 - 15.0 g/dL   HCT 67.9 (L) 63.9 - 53.9 %   MCV 87.2 80.0 - 100.0 fL   MCH 27.0 26.0 - 34.0 pg   MCHC 30.9 30.0 - 36.0 g/dL   RDW 82.4 (H) 88.4 - 84.4 %   Platelets 288 150 - 400 K/uL   nRBC 0.0 0.0 - 0.2 %   Neutrophils Relative % 92 %   Neutro Abs 21.7 (H) 1.7 - 7.7 K/uL   Lymphocytes Relative 2 %   Lymphs  Abs 0.5 (L) 0.7 - 4.0 K/uL   Monocytes Relative 5 %   Monocytes Absolute 1.1 (H) 0.1 - 1.0 K/uL   Eosinophils Relative 0 %   Eosinophils Absolute 0.0 0.0 - 0.5 K/uL   Basophils Relative 0 %   Basophils Absolute 0.1 0.0 - 0.1 K/uL   Immature Granulocytes 1 %   Abs Immature Granulocytes 0.19 (H) 0.00 - 0.07 K/uL    Comment: Performed at [facility], [address], [place], [place] [address]  Blood culture (routine x 2)     Status: None (Preliminary result)   Collection Time: 12/17/23 11:45 AM   Specimen: BLOOD  Result Value Ref Range   Specimen Description BLOOD RIGHT ASSIST CONTROL    Special Requests      BOTTLES DRAWN AEROBIC AND ANAEROBIC Blood Culture adequate volume   Culture      NO GROWTH < 24 HOURS Performed at [name], [address] [place], [place] [address]    Report Status PENDING   Magnesium      Status: None   Collection Time: 12/17/23 11:45 AM  Result Value Ref Range   Magnesium  2.4 1.7 - 2.4 mg/dL    Comment: Performed at [name], [address], [place], [place] [address]  Protime-INR      Status: Abnormal   Collection Time: 12/17/23 11:45 AM  Result Value Ref Range   Prothrombin  Time 20.2 (H) 11.4 - 15.2 seconds   INR 1.6 (H) 0.8 - 1.2    Comment: (NOTE) INR goal varies based on device and disease states. Performed at [name], [address] [place], [place] [address]   Blood culture (routine x 2)     Status: None (Preliminary result)   Collection Time: 12/17/23 12:10 PM   Specimen: BLOOD  Result Value Ref Range   Specimen Description BLOOD LEFT ARM    Special Requests      BOTTLES DRAWN AEROBIC AND ANAEROBIC Blood Culture results may not be optimal due to an inadequate volume of blood received in culture bottles   Culture      NO GROWTH < 24 HOURS Performed at [name], [address] [place], [place] [address]    Report Status PENDING   Blood gas, venous (at [facility] and [facility])     Status: None   Collection Time: 12/17/23 12:13 PM  Result Value Ref Range   pH, Ven 7.3 7.25 - 7.43   pCO2, Ven 55 44 - 60 mmHg   pO2, Ven 37 32 - 45 mmHg   Bicarbonate 26.8 20.0 - 28.0 mmol/L   Acid-base deficit 0.1 0.0 - 2.0 mmol/L   O2 Saturation 47.7 %   Patient temperature 38.4    Collection site BLOOD LEFT FOREARM    Drawn by [id number]     Comment: Performed at [facility], [address] [place], [place] [address]  Urinalysis, w/ Reflex to Culture (Infection Suspected) -Urine, Clean Catch     Status: Abnormal   Collection Time: 12/17/23  1:32 PM  Result Value Ref Range   Specimen Source URINE, CLEAN CATCH    Color, Urine GREEN (A) YELLOW    Comment: BIOCHEMICALS MAY BE AFFECTED BY COLOR   APPearance TURBID (A) CLEAR   Specific Gravity, Urine 1.025 1.005 - 1.030   pH 6.0 5.0 - 8.0   Glucose, UA NEGATIVE NEGATIVE mg/dL   Hgb urine dipstick MODERATE (A) NEGATIVE   Bilirubin Urine SMALL (A) NEGATIVE   Ketones, ur 15 (A) NEGATIVE mg/dL  Protein, ur >300 (A) NEGATIVE mg/dL   Nitrite NEGATIVE NEGATIVE   Leukocytes,Ua LARGE (A) NEGATIVE   Squamous Epithelial / HPF 0-5 0 - 5  /HPF   WBC, UA >50 0 - 5 WBC/hpf    Comment: Reflex urine culture not performed if WBC <=10, OR if Squamous epithelial cells >5. If Squamous epithelial cells >5, suggest recollection.   RBC / HPF 6-10 0 - 5 RBC/hpf   Bacteria, UA MANY (A) NONE SEEN    Comment: MICROSCOPIC EXAM PERFORMED ON UNCONCENTRATED URINE Performed at [facility], [address], [place], [place] [address]   Urine Culture     Status: None (Preliminary result)   Collection Time: 12/17/23  1:32 PM   Specimen: Urine, Clean Catch  Result Value Ref Range   Specimen Description      URINE, CLEAN CATCH Performed at [facility], [address] [place], [place] [address]    Special Requests      NONE Reflexed from [id number] Performed at [facility] [name] [name], [address], [place], [place] [address]    Culture PENDING    Report Status PENDING   Lactic acid, plasma     Status: None   Collection Time: 12/17/23  1:52 PM  Result Value Ref Range   Lactic Acid, Venous 1.3 0.5 - 1.9 mmol/L    Comment: Performed at [facility], [address] [place], [place] [address]  Glucose, capillary     Status: Abnormal   Collection Time: 12/17/23  4:44 PM  Result Value Ref Range   Glucose-Capillary 187 (H) 70 - 99 mg/dL    Comment: Glucose reference range applies only to samples taken after fasting for at least 8 hours.  MRSA Next Gen by PCR, Nasal     Status: None   Collection Time: 12/17/23  4:54 PM   Specimen: Nasal Mucosa; Nasal Swab  Result Value Ref Range   MRSA by PCR Next Gen NOT DETECTED NOT DETECTED    Comment: (NOTE) The GeneXpert MRSA Assay (FDA approved for NASAL specimens only), is one component of a comprehensive MRSA colonization surveillance program. It is not intended to diagnose MRSA infection nor to guide or monitor treatment for MRSA infections. Test performance is not FDA approved in patients less than 43 years old. Performed at [facility], [address], [name], [place] [address]   Glucose,  capillary     Status: Abnormal   Collection Time: 12/17/23  7:32 PM  Result Value Ref Range   Glucose-Capillary 206 (H) 70 - 99 mg/dL    Comment: Glucose reference range applies only to samples taken after fasting for at least 8 hours.  Glucose, capillary     Status: Abnormal   Collection Time: 12/17/23 11:34 PM  Result Value Ref Range   Glucose-Capillary 233 (H) 70 - 99 mg/dL    Comment: Glucose reference range applies only to samples taken after fasting for at least 8 hours.  Glucose, capillary     Status: Abnormal   Collection Time: 12/18/23  3:27 AM  Result Value Ref Range   Glucose-Capillary 206 (H) 70 - 99 mg/dL    Comment: Glucose reference range applies only to samples taken after fasting for at least 8 hours.  CBC     Status: Abnormal   Collection Time: 12/18/23  3:56 AM  Result Value Ref Range   WBC 23.1 (H) 4.0 - 10.5 K/uL   RBC 3.82 (L) 3.87 - 5.11 MIL/uL   Hemoglobin 10.1 (L)  12.0 - 15.0 g/dL   HCT 66.7 (L) 63.9 - 53.9 %   MCV 86.9 80.0 - 100.0 fL   MCH 26.4 26.0 - 34.0 pg   MCHC 30.4 30.0 - 36.0 g/dL   RDW 82.5 (H) 88.4 - 84.4 %   Platelets 345 150 - 400 K/uL   nRBC 0.0 0.0 - 0.2 %    Comment: Performed at [facility], [address] [place], [place] [address]  Basic metabolic panel     Status: Abnormal   Collection Time: 12/18/23  3:56 AM  Result Value Ref Range   Sodium 132 (L) 135 - 145 mmol/L   Potassium 4.3 3.5 - 5.1 mmol/L   Chloride 90 (L) 98 - 111 mmol/L   CO2 20 (L) 22 - 32 mmol/L   Glucose, Bld 235 (H) 70 - 99 mg/dL    Comment: Glucose reference range applies only to samples taken after fasting for at least 8 hours.   BUN 73 (H) 8 - 23 mg/dL   Creatinine, Ser 3.46 (H) 0.44 - 1.00 mg/dL   Calcium  8.9 8.9 - 10.3 mg/dL   GFR, Estimated 6 (L) >60 mL/min    Comment: (NOTE) Calculated using the CKD-EPI Creatinine Equation (2021)    Anion gap 22 (H) 5 - 15    Comment: ELECTROLYTES REPEATED TO VERIFY Performed at [name], [address], [place], [place] [address]   Heparin  level (unfractionated)     Status: Abnormal   Collection Time: 12/18/23  3:56 AM  Result Value Ref Range   Heparin  Unfractionated >1.10 (H) 0.30 - 0.70 IU/mL    Comment: (NOTE) The clinical reportable range upper limit is being lowered to >1.10 to align with the FDA approved guidance for the current laboratory assay.  If heparin  results are below expected values, and patient dosage has  been confirmed, suggest follow up testing of antithrombin III levels. Performed at [facility], [address], [place], [place] [address]   APTT     Status: Abnormal   Collection Time: 12/18/23  3:56 AM  Result Value Ref Range   aPTT 45 (H) 24 - 36 seconds    Comment:        IF BASELINE aPTT IS ELEVATED, SUGGEST PATIENT RISK ASSESSMENT BE USED TO DETERMINE APPROPRIATE ANTICOAGULANT THERAPY. Performed at [facility], [address], [place], [place] [address]   TSH     Status: None   Collection Time: 12/18/23  3:56 AM  Result Value Ref Range   TSH 4.025 0.350 - 4.500 uIU/mL    Comment: Performed by a 3rd Generation assay with a functional sensitivity of <=0.01 uIU/mL. Performed at [facility], [address] [place], [place] [address]   Glucose, capillary     Status: Abnormal   Collection Time: 12/18/23  8:08 AM  Result Value Ref Range   Glucose-Capillary 190 (H) 70 - 99 mg/dL    Comment: Glucose reference range applies only to samples taken after fasting for at least 8 hours.   *Note: Due to a large number of results and/or encounters for the requested time period, some results have not been displayed. A complete set of results can be found in Results Review.   CT ABDOMEN PELVIS WO CONTRAST Result Date: 12/17/2023 CLINICAL DATA:  Decubitus wounds. EXAM: CT ABDOMEN AND PELVIS WITHOUT CONTRAST TECHNIQUE: Multidetector CT imaging of the abdomen and pelvis was performed following the standard protocol without IV contrast. RADIATION  DOSE REDUCTION: This exam was performed according to the departmental dose-optimization program which includes automated exposure control, adjustment of the mA and/or kV according to patient size and/or use of iterative reconstruction technique. COMPARISON:  Oct 01, 2023. FINDINGS: Lower chest: No acute abnormality. Hepatobiliary: Cholelithiasis. No biliary dilatation. Liver is unremarkable. Pancreas: Unremarkable. No pancreatic ductal dilatation or surrounding inflammatory changes. Spleen: Multiple small calcified splenic granulomas are noted. Adrenals/Urinary Tract: Adrenal glands appear normal. Bilateral renal atrophy is noted consistent with end-stage renal disease. No hydronephrosis or renal obstruction is noted. Urinary bladder is unremarkable. Stomach/Bowel: Stomach is within normal limits. Appendix appears normal. No evidence of bowel wall thickening, distention, or inflammatory changes. Moderate amount of stool is seen in the sigmoid colon and rectum. Vascular/Lymphatic: Aortic atherosclerosis. No enlarged abdominal or pelvic lymph nodes. Reproductive: Status post hysterectomy. No adnexal masses. Other: No ascites or hernia is noted. Large sacral decubitus ulceration or wound is noted, but does not appear to extend to underlying bone. Musculoskeletal: No acute or significant osseous findings. IMPRESSION: Interval development of large sacral decubitus ulceration or wound is noted described above. Cholelithiasis. Bilateral renal atrophy consistent with history of end-stage renal disease. Moderate amount of stool is seen in sigmoid colon and rectum concerning for possible impaction. Aortic Atherosclerosis (ICD10-I70.0). Electronically Signed   By: Lynwood Landy Raddle M.D.   On: 12/17/2023 11:48   DG Chest Port 1 View Result Date: 12/17/2023 CLINICAL DATA:  . questionable sepsis.  Left large ache.  Confusion EXAM: PORTABLE CHEST 1 VIEW COMPARISON:  Chest x-ray 10/01/2023 FINDINGS: Underinflation. Stable  cardiopericardial silhouette with calcified aorta. Bronchovascular crowding. No consolidation, pneumothorax or effusion. Double-lumen right IJ catheter with tip overlying the SVC right atrial junction region. Overlapping cardiac leads. Degenerative changes along the spine. IMPRESSION: Underinflation. Bronchovascular crowding. Double-lumen right IJ catheter. No consolidation. Electronically Signed   By: Ranell Bring M.D.   On: 12/17/2023 11:42    Anti-infectives (From admission, onward)    Start     Dose/Rate Route Frequency Ordered Stop   12/17/23 1800  meropenem  (MERREM ) 1 g in sodium chloride  0.9 % 100 mL IVPB        1 g 200 mL/hr over 30 Minutes Intravenous Every 24 hours 12/17/23 1713     12/17/23 1713  vancomycin  variable dose per unstable renal function (pharmacist dosing)  Status:  Discontinued         Does not apply See admin instructions 12/17/23 1713 12/18/23 0905   12/17/23 1130  vancomycin  (VANCOREADY) IVPB 2000 mg/400 mL        2,000 mg 200 mL/hr over 120 Minutes Intravenous  Once 12/17/23 1044 12/17/23 1405   12/17/23 1045  vancomycin  (VANCOCIN ) IVPB 1000 mg/200 mL premix  Status:  Discontinued        1,000 mg 200 mL/hr over 60 Minutes Intravenous  Once 12/17/23 1036 12/17/23 1044   12/17/23 1045  piperacillin -tazobactam (ZOSYN ) IVPB 3.375 g        3.375 g 100 mL/hr over 30 Minutes Intravenous  Once 12/17/23 1036 12/17/23 1218       Assessment/Plan Sacral decubital Ulcer   73 y.o. female with PMHx of ESRD on HD, DM2 on insulin , CAD, HFrEF, pAF, decub ulcers present on admission.  -CT shows - Large sacral decubitus ulceration or wound is noted, but does not appear to extend to underlying bone. - WBC 23.1 K, Lactate stabilized. Anemic but stable.  - Patient on pressors for sepsis most likely secondary to UTI. -Based on patient presentation,  imaging and P/E of sacral wound, patient does not require surgical intervention at this time. Case was discussed with my attending.   - Plan to continue wound care with WTD dressing soaked in Dakins solution X 3 Days followed by Santyl  ointment for wound debridement per WOC RN.  -Continue IV abx -We will continue to follow closely.   FEN - Heart diet, IVF VTE - Heparin  infusion  ID - Meropenem  per pharmacy  Dispo - CCM  Per CCM- Anemia ESRD HTN T2DM pAF CHF   I reviewed nursing notes, last 24 h vitals and pain scores, last 48 h intake and output, last 24 h labs and trends, and last 24 h imaging results.   Eulah Hammonds, PA-C [facility] 12/18/2023, 11:30 AM Please see Amion for pager number during day hours 7:00am-4:30pm

## 2023-12-18 NOTE — Consult Note (Signed)
 ORTHOPAEDIC CONSULTATION  REQUESTING PHYSICIAN: Kara Dorn NOVAK, MD  Chief Complaint: Breakdown ulceration on left leg.  HPI: Susan [name] is a 73 y.o. female who presents with recurrent ulceration left leg.  Patient is status post tissue graft and wound healing and states the wound was completely healed.  Patient had acute medical event with acute sacral decubitus ulcer and breakdown of the left leg ulceration.  Past Medical History:  Diagnosis Date   Allergy    Anemia    hx   Anxiety    Arthritis    generalized (03/15/2014)   CAD (coronary artery disease)    MI in 2000 - MI  2007 - treated bare metal stent (no nuclear since then as 9/11)   Carotid artery disease (HCC)    CHF (congestive heart failure) (HCC)    Chronic diastolic heart failure (HCC)    a) ECHO (08/2013) EF 55-60% and RV function nl b) RHC (08/2013) RA 4, RV 30/5/7, PA 25/10 (16), PCWP 7, Fick CO/CI 6.3/2.7, PVR 1.5 WU, PA 61 and 66%   Clotting disorder (HCC)    Daily headache    ~ every other day; since I fell in June (03/15/2014)   Depression    Diabetic retinopathy (HCC)    Dyslipidemia    ESRD (end stage renal disease) (HCC)    Dialysis on Tues Thurs Sat   Exertional shortness of breath    GERD (gastroesophageal reflux disease)    History of blood transfusion    History of kidney stones    HTN (hypertension)    Hypothyroidism    Myocardial infarction (HCC)    Obesity    Osteoarthritis    Oxygen  deficiency    PAF (paroxysmal atrial fibrillation) (HCC)    Peripheral neuropathy    bilateral feet/hands   PONV (postoperative nausea and vomiting)    RBBB (right bundle branch block)    Old   Stroke (HCC)    mini strokes   Type II diabetes mellitus (HCC)    Type II, Barn[place] libre left upper arm. patient has omnipod insulin  pump with Novolin R Insulin    Past Surgical History:  Procedure Laterality Date   A/V FISTULAGRAM Left 11/07/2022   Procedure: A/V Fistulagram;  Surgeon: Magda Debby SAILOR, MD;  [facility];  Service: Cardiovascular;  Laterality: Left;   ABDOMINAL HYSTERECTOMY  1980's   AMPUTATION Right 02/24/2018   Procedure: RIGHT FOOT GREAT TOE AND 2ND TOE AMPUTATION;  Surgeon: Harden Jerona GAILS, MD;  Location: [facility] OR;  Service: Orthopedics;  Laterality: Right;   AMPUTATION Right 04/30/2018   Procedure: RIGHT TRANSMETATARSAL AMPUTATION;  Surgeon: Harden Jerona GAILS, MD;  Location: [name] OR;  Service: Orthopedics;  Laterality: Right;   AMPUTATION Right 05/02/2022   Procedure: RIGHT BELOW KNEE AMPUTATION;  Surgeon: Harden Jerona GAILS, MD;  Location: [facility] OR;  Service: Orthopedics;  Laterality: Right;   APPLICATION OF WOUND VAC Right 06/13/2022   Procedure: APPLICATION OF WOUND VAC;  Surgeon: Harden Jerona GAILS, MD;  [facility];  Service: Orthopedics;  Laterality: Right;   APPLICATION OF WOUND VAC Left 11/14/2022   Procedure: APPLICATION OF WOUND VAC;  Surgeon: Harden Jerona GAILS, MD;  [facility];  Service: Orthopedics;  Laterality: Left;   AV FISTULA PLACEMENT Left 04/02/2022   Procedure: LEFT ARM ARTERIOVENOUS (AV) FISTULA CREATION;  Surgeon: Serene Gaile ORN, MD;  [facility];  Service: Vascular;  Laterality: Left;  PERIPHERAL NERVE BLOCK   AV FISTULA PLACEMENT Right 04/27/2023  Procedure: RIGHT ARM BRACHIOBASILIC ARTERIOVENOUS (AV) FISTULA CREATION;  Surgeon: Magda Debby SAILOR, MD;  [facility];  Service: Vascular;  Laterality: Right;   BASCILIC VEIN TRANSPOSITION Left 07/31/2022   Procedure: LEFT ARM SECOND STAGE BASILIC VEIN TRANSPOSITION;  Surgeon: Serene Gaile ORN, MD;  [facility];  Service: Vascular;  Laterality: Left;   BIOPSY  05/27/2020   Procedure: BIOPSY;  Surgeon: Cindie Carlin POUR, DO;  [facility];  Service: Endoscopy;;   CATARACT EXTRACTION, BILATERAL Bilateral ?2013   COLONOSCOPY W/ POLYPECTOMY     COLONOSCOPY WITH PROPOFOL  N/A 03/13/2019   Procedure: COLONOSCOPY WITH PROPOFOL ;  Surgeon: Albertus Gordy HERO, MD;  Location: [facility] ENDOSCOPY;  Service:  Gastroenterology;  Laterality: N/A;   CORONARY ANGIOPLASTY WITH STENT PLACEMENT  1999; 2007   1 + 1   ERCP N/A 02/03/2022   Procedure: ENDOSCOPIC RETROGRADE CHOLANGIOPANCREATOGRAPHY (ERCP);  Surgeon: Rollin Dover, MD;  Location: [facility] ENDOSCOPY;  Service: Gastroenterology;  Laterality: N/A;   ESOPHAGOGASTRODUODENOSCOPY N/A 02/12/2023   Procedure: ESOPHAGOGASTRODUODENOSCOPY (EGD);  Surgeon: Rollin Dover, MD;  Location: [facility] ENDOSCOPY;  Service: Gastroenterology;  Laterality: N/A;   ESOPHAGOGASTRODUODENOSCOPY (EGD) WITH PROPOFOL  N/A 03/13/2019   Procedure: ESOPHAGOGASTRODUODENOSCOPY (EGD) WITH PROPOFOL ;  Surgeon: Albertus Gordy HERO, MD;  Location: [facility] ENDOSCOPY;  Service: Gastroenterology;  Laterality: N/A;   ESOPHAGOGASTRODUODENOSCOPY (EGD) WITH PROPOFOL  N/A 05/27/2020   Procedure: ESOPHAGOGASTRODUODENOSCOPY (EGD) WITH PROPOFOL ;  Surgeon: Cindie Carlin POUR, DO;  [facility];  Service: Endoscopy;  Laterality: N/A;   ESOPHAGOGASTRODUODENOSCOPY (EGD) WITH PROPOFOL  N/A 09/03/2022   Procedure: ESOPHAGOGASTRODUODENOSCOPY (EGD) WITH PROPOFOL ;  Surgeon: Rollin Dover, MD;  Location: [facility] ENDOSCOPY;  Service: Gastroenterology;  Laterality: N/A;   EYE SURGERY Bilateral    lazer   FLEXIBLE SIGMOIDOSCOPY N/A 05/23/2022   Procedure: FLEXIBLE SIGMOIDOSCOPY;  Surgeon: Rollin Dover, MD;  Location: [facility] ENDOSCOPY;  Service: Gastroenterology;  Laterality: N/A;   FLEXIBLE SIGMOIDOSCOPY N/A 05/24/2022   Procedure: FLEXIBLE SIGMOIDOSCOPY;  Surgeon: Federico Rosario BROCKS, MD;  Location: [facility] ENDOSCOPY;  Service: Gastroenterology;  Laterality: N/A;   FLEXIBLE SIGMOIDOSCOPY N/A 09/03/2022   Procedure: FLEXIBLE SIGMOIDOSCOPY;  Surgeon: Rollin Dover, MD;  Location: [facility] ENDOSCOPY;  Service: Gastroenterology;  Laterality: N/A;   HEMOSTASIS CLIP PLACEMENT  03/13/2019   Procedure: HEMOSTASIS CLIP PLACEMENT;  Surgeon: Albertus Gordy HERO, MD;  Location: [facility] ENDOSCOPY;  Service: Gastroenterology;;   HEMOSTASIS CLIP PLACEMENT  05/23/2022    Procedure: HEMOSTASIS CLIP PLACEMENT;  Surgeon: Rollin Dover, MD;  Location: [facility] ENDOSCOPY;  Service: Gastroenterology;;   HEMOSTASIS CONTROL  05/24/2022   Procedure: HEMOSTASIS CONTROL;  Surgeon: Federico Rosario BROCKS, MD;  Location: [facility] ENDOSCOPY;  Service: Gastroenterology;;   HOT HEMOSTASIS N/A 05/23/2022   Procedure: HOT HEMOSTASIS (ARGON PLASMA COAGULATION/BICAP);  Surgeon: Rollin Dover, MD;  Location: [facility] ENDOSCOPY;  Service: Gastroenterology;  Laterality: N/A;   I & D EXTREMITY Left 05/05/2022   Procedure: IRRIGATION AND DEBRIDEMENT LEFT ARM AV FISTULA;  Surgeon: Gretta Lonni PARAS, MD;  Location: [facility] OR;  Service: Vascular;  Laterality: Left;   I & D EXTREMITY N/A 11/14/2022   Procedure: IRRIGATION AND DEBRIDEMENT OF LOWER EXTREMITY WOUND;  Surgeon: Harden Jerona GAILS, MD;  [facility];  Service: Orthopedics;  Laterality: N/A;   INSERTION OF DIALYSIS CATHETER Right 04/02/2022   Procedure: INSERTION OF TUNNELED DIALYSIS CATHETER;  Surgeon: Serene Gaile ORN, MD;  [facility];  Service: Vascular;  Laterality: Right;   IR FLUORO GUIDE CV LINE RIGHT  08/10/2023   IR VENOCAVAGRAM IVC  08/10/2023   KNEE ARTHROSCOPY Left 10/25/2006  POLYPECTOMY  03/13/2019   Procedure: POLYPECTOMY;  Surgeon: Albertus Gordy HERO, MD;  Location: [facility] ENDOSCOPY;  Service: Gastroenterology;;   REMOVAL OF STONES  02/03/2022   Procedure: REMOVAL OF STONES;  Surgeon: Rollin Dover, MD;  Location: [facility] ENDOSCOPY;  Service: Gastroenterology;;   REVISON OF ARTERIOVENOUS FISTULA Left 08/20/2022   Procedure: REVISON OF LEFT ARM ARTERIOVENOUS FISTULA;  Surgeon: Serene Gaile ORN, MD;  [facility];  Service: Vascular;  Laterality: Left;   RIGHT HEART CATH N/A 07/24/2017   Procedure: RIGHT HEART CATH;  Surgeon: Cherrie Toribio SAUNDERS, MD;  [facility];  Service: Cardiovascular;  Laterality: N/A;   RIGHT HEART CATHETERIZATION N/A 09/22/2013   Procedure: RIGHT HEART CATH;  Surgeon: Toribio SAUNDERS Cherrie, MD;  Location: [facility] CATH LAB;   Service: Cardiovascular;  Laterality: N/A;   SHOULDER ARTHROSCOPY WITH OPEN ROTATOR CUFF REPAIR Right 03/14/2014   Procedure: RIGHT SHOULDER ARTHROSCOPY WITH BICEPS RELEASE, OPEN SUBSCAPULA REPAIR, OPEN SUPRASPINATUS REPAIR.;  Surgeon: Cordella Glendia Hutchinson, MD;  Location: [facility] OR;  Service: Orthopedics;  Laterality: Right;   SPHINCTEROTOMY  02/03/2022   Procedure: SPHINCTEROTOMY;  Surgeon: Rollin Dover, MD;  Location: [facility] ENDOSCOPY;  Service: Gastroenterology;;   STUMP REVISION Right 06/13/2022   Procedure: REVISION RIGHT BELOW KNEE AMPUTATION;  Surgeon: Harden Jerona GAILS, MD;  Location: [facility] OR;  Service: Orthopedics;  Laterality: Right;   TEE WITHOUT CARDIOVERSION N/A 02/04/2022   Procedure: TRANSESOPHAGEAL ECHOCARDIOGRAM (TEE);  Surgeon: Cherrie Toribio SAUNDERS, MD;  Location: [facility] ENDOSCOPY;  Service: Cardiovascular;  Laterality: N/A;   THROMBECTOMY W/ EMBOLECTOMY Left 08/20/2022   Procedure: THROMBECTOMY OF LEFT ARM ARTERIOVENOUS FISTULA;  Surgeon: Serene Gaile ORN, MD;  Location: [facility] OR;  Service: Vascular;  Laterality: Left;   TOE AMPUTATION Right 02/24/2018   GREAT TOE AND 2ND TOE AMPUTATION   TUBAL LIGATION  1970's   UPPER EXTREMITY VENOGRAPHY N/A 07/31/2023   Procedure: UPPER EXTREMITY VENOGRAPHY;  Surgeon: Magda Debby SAILOR, MD;  [facility];  Service: Cardiovascular;  Laterality: N/A;   Social History   Socioeconomic History   Marital status: Married    Spouse name: [place]   Number of children: 3   Years of education: 12th   Highest education level: Not on file  Occupational History    Employer: UNEMPLOYED  Tobacco Use   Smoking status: Former    Current packs/day: 0.00    Average packs/day: 3.0 packs/day for 32.0 years (96.0 ttl pk-yrs)    Types: Cigarettes    Start date: 10/24/1965    Quit date: 10/24/1997    Years since quitting: 26.1   Smokeless tobacco: Never  Vaping Use   Vaping status: Never Used  Substance and Sexual Activity   Alcohol use: Not Currently   Drug use:  No   Sexual activity: Not Currently    Birth control/protection: Surgical    Comment: Hysterectomy  Other Topics Concern   Not on file  Social History Narrative   Pt lives at home with her spouse.Caffeine Use- 3 sodas daily.    Social Drivers of Corporate investment banker Strain: Low Risk  (08/16/2021)   Overall Financial Resource Strain (CARDIA)    Difficulty of Paying Living Expenses: Not hard at all  Food Insecurity: No Food Insecurity (12/17/2023)   Hunger Vital Sign    Worried About Running Out of Food in the Last Year: Never true    Ran Out of Food in the Last Year: Never true  Transportation Needs: No Transportation Needs (12/17/2023)  PRAPARE - Administrator, Civil Service (Medical): No    Lack of Transportation (Non-Medical): No  Physical Activity: Inactive (08/16/2021)   Exercise Vital Sign    Days of Exercise per Week: 0 days    Minutes of Exercise per Session: 0 min  Stress: No Stress Concern Present (08/16/2021)   [employer] of Occupational Health - Occupational Stress Questionnaire    Feeling of Stress : Not at all  Social Connections: Socially Isolated (12/17/2023)   Social Connection and Isolation Panel    Frequency of Communication with Friends and Family: Once a week    Frequency of Social Gatherings with Friends and Family: Once a week    Attends Religious Services: Never    Database administrator or Organizations: No    Attends Engineer, structural: Never    Marital Status: Married   Family History  Problem Relation Age of Onset   Heart attack Mother 13   - negative except otherwise stated in the family history section Allergies  Allergen Reactions   Codeine Nausea And Vomiting   Keflex  [Cephalexin ] Diarrhea   Macrobid  [Nitrofurantoin ] Diarrhea and Nausea And Vomiting   Prior to Admission medications   Medication Sig Start Date End Date Taking? Authorizing Provider  acetaminophen  (TYLENOL ) 500 MG tablet Take 1,000 mg by  mouth every 6 (six) hours as needed for moderate pain (pain score 4-6), fever or headache.   Yes [provider]  albuterol  (VENTOLIN  HFA) 108 (90 Base) MCG/ACT inhaler Inhale 1-2 puffs into the lungs every 6 (six) hours as needed for shortness of breath. 08/14/23  Yes Duanne Butler DASEN, MD  amiodarone  (PACERONE ) 200 MG tablet Take 1 tablet (200 mg total) by mouth daily. 08/14/23  Yes Duanne Butler DASEN, MD  apixaban  (ELIQUIS ) 5 MG TABS tablet Take 1 tablet (5 mg total) by mouth 2 (two) times daily. 08/14/23  Yes Duanne Butler DASEN, MD  Ascorbic Acid  (VITAMIN C  PO) Take 1 tablet by mouth daily.   Yes [provider]  atorvastatin  (LIPITOR ) 80 MG tablet Take 1 tablet (80 mg total) by mouth daily. 08/14/23  Yes Duanne Butler DASEN, MD  B Complex-C-Folic Acid  (DIALYVITE TABLET) TABS Take 1 tablet by mouth daily.   Yes [provider]  calcitRIOL  (ROCALTROL ) 0.25 MCG capsule Take 9 capsules (2.25 mcg total) by mouth Every Tuesday,Thursday,and Saturday with dialysis. Patient taking differently: Take 0.25 mcg by mouth daily. 08/15/23  Yes Dahal, Chapman, MD  clonazePAM  (KLONOPIN ) 0.5 MG disintegrating tablet Take 1 tablet (0.5 mg total) by mouth 2 (two) times daily. Stop lorazepam . 11/19/23  Yes Duanne Butler DASEN, MD  folic acid  (FOLVITE ) 1 MG tablet TAKE 1 TABLET BY MOUTH EVERY DAY 12/15/23  Yes Duanne Butler DASEN, MD  insulin  aspart (NOVOLOG ) 100 UNIT/ML injection Inject 100 Units into the skin at bedtime. Load insulin  pump with 100 units, use over night.   Yes [provider]  lactulose  (CHRONULAC ) 10 GM/15ML solution Take 20 g by mouth daily as needed (constipation).   Yes [provider]  MELATONIN PO Take 1 tablet by mouth at bedtime.   Yes [provider]  methocarbamol  (ROBAXIN ) 750 MG tablet TAKE 1 TABLET (750 MG TOTAL) BY MOUTH EVERY 6 (SIX) HOURS AS NEEDED FOR MUSCLE SPASMS 12/01/23  Yes Duanne Butler DASEN, MD  Methoxy PEG-Epoetin  Beta (MIRCERA IJ) Mircera  02/05/23 02/04/24 Yes [provider]  midodrine  (PROAMATINE ) 10 MG tablet Take 1-2 tablets (10-20 mg total) by mouth Every Tuesday,Thursday,and  Saturday with dialysis. 08/15/23  Yes Duanne Butler DASEN, MD  Multiple Vitamins-Minerals (ZINC  PO) Take 1 tablet by mouth daily.   Yes [provider]  nitroGLYCERIN  (NITROSTAT ) 0.4 MG SL tablet Place 1 tablet (0.4 mg total) under the tongue every 5 (five) minutes as needed. 08/14/23 01/30/24 Yes Duanne Butler DASEN, MD  nystatin  cream (MYCOSTATIN ) Apply 1 Application topically 2 (two) times daily.   Yes [provider]  oxyCODONE -acetaminophen  (PERCOCET/ROXICET) 5-325 MG tablet Take 1 tablet by mouth every 6 (six) hours as needed for severe pain (pain score 7-10).   Yes [provider]  OXYGEN  Inhale 2 L/min into the lungs at bedtime.   Yes [provider]  pantoprazole  (PROTONIX ) 40 MG tablet Take 1 tablet (40 mg total) by mouth daily. 08/14/23  Yes Duanne Butler DASEN, MD  polyethylene glycol (MIRALAX  / GLYCOLAX ) 17 g packet Take 17 g by mouth daily. Patient taking differently: Take 17 g by mouth daily as needed (constipation). 12/10/22  Yes Angiulli, Toribio PARAS, PA-C  pregabalin  (LYRICA ) 150 MG capsule Take 150 mg by mouth 2 (two) times daily. 11/20/23  Yes [provider]  sevelamer  carbonate (RENVELA ) 800 MG tablet Take 800-1,600 mg by mouth See admin instructions. Take 2 tablets (1600mg ) by mouth three times daily with meals and take 1 tablet (800mg ) daily as needed for snacks.   Yes [provider]  venlafaxine  XR (EFFEXOR -XR) 75 MG 24 hr capsule Take 1 capsule (75 mg total) by mouth daily with breakfast. Stop trintellix  08/14/23  Yes Duanne Butler DASEN, MD  Insulin  Disposable Pump (OMNIPOD 5 [id number] PODS GEN 5) MISC Inject 1 Application into the skin 3 days. 12/13/23   [provider]  insulin  glargine (LANTUS  SOLOSTAR) 100 UNIT/ML Solostar Pen Inject 40-44 Units into the skin at bedtime as needed  (insulin  pump malfunction). Patient not taking: Reported on 12/18/2023    [provider]  levothyroxine  (SYNTHROID ) 50 MCG tablet Take 1 tablet (50 mcg total) by mouth daily before breakfast. 08/14/23   Duanne Butler DASEN, MD  nitrofurantoin , macrocrystal-monohydrate, (MACROBID ) 100 MG capsule Take 1 capsule (100 mg total) by mouth daily. Patient not taking: Reported on 12/18/2023 12/11/23   Aguiar, Rafaela, MD  ofloxacin (OCUFLOX) 0.3 % ophthalmic solution Place 1 drop into the left eye 4 (four) times daily. Patient not taking: Reported on 12/18/2023 12/13/23   [provider]  prednisoLONE acetate (PRED FORTE) 1 % ophthalmic suspension 1 drop 4 (four) times daily. Patient not taking: Reported on 12/18/2023    [provider]  sulfamethoxazole -trimethoprim  (BACTRIM  DS) 800-160 MG tablet Take 1 tablet by mouth 2 (two) times daily. Patient not taking: Reported on 12/18/2023 12/14/23   Duanne Butler DASEN, MD   CT ABDOMEN PELVIS WO CONTRAST Result Date: 12/17/2023 CLINICAL DATA:  Decubitus wounds. EXAM: CT ABDOMEN AND PELVIS WITHOUT CONTRAST TECHNIQUE: Multidetector CT imaging of the abdomen and pelvis was performed following the standard protocol without IV contrast. RADIATION DOSE REDUCTION: This exam was performed according to the departmental dose-optimization program which includes automated exposure control, adjustment of the mA and/or kV according to patient size and/or use of iterative reconstruction technique. COMPARISON:  Oct 01, 2023. FINDINGS: Lower chest: No acute abnormality. Hepatobiliary: Cholelithiasis. No biliary dilatation. Liver is unremarkable. Pancreas: Unremarkable. No pancreatic ductal dilatation or surrounding inflammatory changes. Spleen: Multiple small calcified splenic granulomas are noted. Adrenals/Urinary Tract: Adrenal glands appear normal. Bilateral renal atrophy is noted consistent with end-stage renal disease. No hydronephrosis or renal obstruction is  noted.  Urinary bladder is unremarkable. Stomach/Bowel: Stomach is within normal limits. Appendix appears normal. No evidence of bowel wall thickening, distention, or inflammatory changes. Moderate amount of stool is seen in the sigmoid colon and rectum. Vascular/Lymphatic: Aortic atherosclerosis. No enlarged abdominal or pelvic lymph nodes. Reproductive: Status post hysterectomy. No adnexal masses. Other: No ascites or hernia is noted. Large sacral decubitus ulceration or wound is noted, but does not appear to extend to underlying bone. Musculoskeletal: No acute or significant osseous findings. IMPRESSION: Interval development of large sacral decubitus ulceration or wound is noted described above. Cholelithiasis. Bilateral renal atrophy consistent with history of end-stage renal disease. Moderate amount of stool is seen in sigmoid colon and rectum concerning for possible impaction. Aortic Atherosclerosis (ICD10-I70.0). Electronically Signed   By: Lynwood Landy Raddle M.D.   On: 12/17/2023 11:48   DG Chest Port 1 View Result Date: 12/17/2023 CLINICAL DATA:  . questionable sepsis.  Left large ache.  Confusion EXAM: PORTABLE CHEST 1 VIEW COMPARISON:  Chest x-ray 10/01/2023 FINDINGS: Underinflation. Stable cardiopericardial silhouette with calcified aorta. Bronchovascular crowding. No consolidation, pneumothorax or effusion. Double-lumen right IJ catheter with tip overlying the SVC right atrial junction region. Overlapping cardiac leads. Degenerative changes along the spine. IMPRESSION: Underinflation. Bronchovascular crowding. Double-lumen right IJ catheter. No consolidation. Electronically Signed   By: Ranell Bring M.D.   On: 12/17/2023 11:42   - pertinent xrays, CT, MRI studies were reviewed and independently interpreted  Positive ROS: All other systems have been reviewed and were otherwise negative with the exception of those mentioned in the HPI and as above.  Physical Exam: General: Alert, no acute  distress Psychiatric: Patient is competent for consent with normal mood and affect Lymphatic: No axillary or cervical lymphadenopathy Cardiovascular: No pedal edema Respiratory: No cyanosis, no use of accessory musculature GI: No organomegaly, abdomen is soft and non-tender    Images:  @ENCIMAGES @  Labs:  Lab Results  Component Value Date   HGBA1C 6.8 (H) 10/01/2023   HGBA1C 9.0 (H) 02/27/2023   HGBA1C 7.9 (H) 08/27/2022   ESRSEDRATE 17 11/10/2022   CRP 3.6 (H) 11/10/2022   CRP 0.6 09/01/2022   CRP 0.7 08/22/2020   LABURIC 4.7 08/09/2020   LABURIC 9.1 (H) 10/12/2018   LABURIC 9.0 (H) 03/06/2018   REPTSTATUS PENDING 12/17/2023   GRAMSTAIN  11/14/2022    MODERATE WBC PRESENT,BOTH PMN AND MONONUCLEAR RARE SQUAMOUS EPITHELIAL CELLS PRESENT NO ORGANISMS SEEN Performed at [facility], [address] [place], [place] [address]    CULT >=100,000 COLONIES/mL PROTEUS MIRABILIS (A) 12/17/2023   LABORGA ESCHERICHIA COLI (A) 08/05/2023    Lab Results  Component Value Date   ALBUMIN  2.3 (L) 12/17/2023   ALBUMIN  2.5 (L) 10/09/2023   ALBUMIN  2.3 (L) 10/08/2023   PREALBUMIN 12 (L) 11/10/2022   PREALBUMIN 13 (L) 05/02/2022   LABURIC 4.7 08/09/2020   LABURIC 9.1 (H) 10/12/2018   LABURIC 9.0 (H) 03/06/2018        Latest Ref Rng & Units 12/18/2023    3:56 AM 12/17/2023   11:45 AM 10/07/2023    4:32 AM  CBC EXTENDED  WBC 4.0 - 10.5 K/uL 23.1  23.6  5.1   RBC 3.87 - 5.11 MIL/uL 3.82  3.67  3.63   Hemoglobin 12.0 - 15.0 g/dL 89.8  9.9  9.8   HCT 63.9 - 46.0 % 33.2  32.0  33.0   Platelets 150 - 400 K/uL 345  288  191   NEUT# 1.7 - 7.7 K/uL  21.7    Lymph# 0.7 - 4.0 K/uL  0.5      Neurologic: Patient does not have protective sensation bilateral lower extremities.   MUSCULOSKELETAL:   Skin: Examination patient has acute venous stasis swelling of the left lower extremity with papillomata's changes brawny edema.  She has a recurrent lateral venous ulcer.  There is no  cellulitis there is no tunneling or undermining.  Most recent ankle-brachial indices shows a great toe pressure of 110 with monophasic flow at the ankle with calcified arteries.  Patient's labs show a white cell count of 23.1 with a neutrophil lymphocyte ratio greater than 40.  Hemoglobin 10.1.  Albumin  2.3.  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  Plan: Plan: I will place orders for daily Vashe dressing changes with Ace compressive wrap.  I will follow-up in the office after discharge to begin serial compression wraps.  Thank you for the consult and the opportunity to see Ms. Lala Jerona Sage, MD [facility] [phone] 2:55 PM

## 2023-12-18 NOTE — Care Management (Signed)
 Transition of Care [facility]) - Inpatient Brief Assessment   Patient Details  Name: Susan [name] MRN: [id number] Date of Birth: 30-Jul-1950  Transition of Care [facility]) CM/SW Contact:    Corean [name] Canary, RN Phone Number: 12/18/2023, 3:25 PM   Clinical Narrative:  73 year old history of ESRD dialysis, wheelchair bound, is active with home health. Advanced care- [place]. According   The patient has wounds, Dr Harden consulted.   ROC will follow   Transition of Care Asessment: Insurance and Status: Insurance coverage has been reviewed Patient has primary care physician: Yes Home environment has been reviewed: Lives with family Prior level of function:: Wheelchair bound, diaylsis, wounds Prior/Current Home Services: Current home services [facility] for home health) Social Drivers of Health Review: SDOH reviewed needs interventions   Transition of care needs: transition of care needs identified, TOC will continue to follow

## 2023-12-18 NOTE — Consult Note (Addendum)
 WOC consult follow-up; Refer to earlier consult this morning for assessment and plan of care for sacrum wound.   Requested to assess left leg wound. Pt was treated by Dr Harden of the ortho team  and had Kerecis skin graft applied to a full thickness wound on 11/14/22.  She states it has been healed until recently, when it reopened. Left posterior calf with full thickness wound, 8X5X.3cm, wound bed is red/yellow/black with large amt tan drainage.  Applied Xeroform gauze to avoid adherence over previous graft site.  Secure chat sent to Dr Harden as follows, This patient has been admitted to ICU and she previously had a Kerecis graft placed by you to left leg and said it was healed but now is breaking down and is a full thickness wound with large amt drainage. I have placed Xeroform gauze and a foam dressing but she could benefit from an assessment and further plan of care. Topical treatment orders provided for bedside nurses to perform as follows until further recommendations are available from Dr Harden: Apply Xeroform gauze to left leg Q day, then cover with foam dressing.  Change foam dressing Q 3 days or PRN soiling.  Please re-consult if further assistance is needed.  Thank-you,  Stephane Fought MSN, RN, CWOCN, CWCN-AP, CNS Contact Mon-Fri 0700-1500: [phone]

## 2023-12-18 NOTE — Progress Notes (Signed)
 PHARMACY - ANTICOAGULATION CONSULT NOTE  Pharmacy Consult for Heparin  Indication: atrial fibrillation  Allergies  Allergen Reactions   Codeine Nausea And Vomiting   Keflex  [Cephalexin ] Diarrhea   Macrobid  [Nitrofurantoin ] Diarrhea and Nausea And Vomiting   Patient Measurements: Height: 5' 5 (165.1 cm) Weight: 77.9 kg (171 lb 11.8 oz) IBW/kg (Calculated) : 57 HEPARIN  DW (KG): 81.3  Vital Signs: Temp: 100.4 F (38 C) (07/25 1530) Temp Source: Oral (07/25 1530) BP: 95/44 (07/25 1703) Pulse Rate: 75 (07/25 1703)  Labs: Recent Labs    12/17/23 1145 12/18/23 0356 12/18/23 1629  HGB 9.9* 10.1*  --   HCT 32.0* 33.2*  --   PLT 288 345  --   APTT  --  45* 45*  LABPROT 20.2*  --   --   INR 1.6*  --   --   HEPARINUNFRC  --  >1.10*  --   CREATININE 6.28* 6.53*  --    Estimated Creatinine Clearance: 7.8 mL/min (A) (by C-G formula based on SCr of 6.53 mg/dL (H)).  Medical History: Past Medical History:  Diagnosis Date   Allergy    Anemia    hx   Anxiety    Arthritis    generalized (03/15/2014)   CAD (coronary artery disease)    MI in 2000 - MI  2007 - treated bare metal stent (no nuclear since then as 9/11)   Carotid artery disease (HCC)    CHF (congestive heart failure) (HCC)    Chronic diastolic heart failure (HCC)    a) ECHO (08/2013) EF 55-60% and RV function nl b) RHC (08/2013) RA 4, RV 30/5/7, PA 25/10 (16), PCWP 7, Fick CO/CI 6.3/2.7, PVR 1.5 WU, PA 61 and 66%   Clotting disorder (HCC)    Daily headache    ~ every other day; since I fell in June (03/15/2014)   Depression    Diabetic retinopathy (HCC)    Dyslipidemia    ESRD (end stage renal disease) (HCC)    Dialysis on Tues Thurs Sat   Exertional shortness of breath    GERD (gastroesophageal reflux disease)    History of blood transfusion    History of kidney stones    HTN (hypertension)    Hypothyroidism    Myocardial infarction (HCC)    Obesity    Osteoarthritis    Oxygen  deficiency    PAF  (paroxysmal atrial fibrillation) (HCC)    Peripheral neuropathy    bilateral feet/hands   PONV (postoperative nausea and vomiting)    RBBB (right bundle branch block)    Old   Stroke (HCC)    mini strokes   Type II diabetes mellitus (HCC)    Type II, Barn[place] libre left upper arm. patient has omnipod insulin  pump with Novolin R Insulin     Assessment: 28 YOF presenting with septic shock, on apixaban  PTA for atrial fibrillation. Med rec not completed, but fill history is consistent with adherence. Pt receiving heparin  infusion while awaiting plans for possible surgical debridement of sacral wound. Hgb stable at ~10 and platelets WNL. No issues with bleeding or heparin  infusion reported.  PM update: aPTT remains subtherapeutic at 45 seconds despite bolus and rate increase to 1400 units/h. No issues with the infusion, level drawn appropriately per RN and no bleeding reported.  Goal of Therapy:  Heparin  level 0.3-0.7 units/ml aPTT 66-102 seconds Monitor platelets by anticoagulation protocol: Yes   Plan:  Give heparin  bolus 2,000 units now and increase heparin  infusion to 1700 units/h. Check aPTT in  8 hours to confirm in therapeutic range. Monitor aPTT, heparin  level, and CBC daily   Rocky Slade, PharmD, BCPS 12/18/2023 5:17 PM  Please check AMION for all [facility] phone numbers After 10:00 PM, call Main Pharmacy [phone]

## 2023-12-19 DIAGNOSIS — R6521 Severe sepsis with septic shock: Secondary | ICD-10-CM | POA: Diagnosis not present

## 2023-12-19 DIAGNOSIS — A419 Sepsis, unspecified organism: Secondary | ICD-10-CM | POA: Diagnosis not present

## 2023-12-19 DIAGNOSIS — Z992 Dependence on renal dialysis: Secondary | ICD-10-CM | POA: Diagnosis not present

## 2023-12-19 DIAGNOSIS — N186 End stage renal disease: Secondary | ICD-10-CM | POA: Diagnosis not present

## 2023-12-19 LAB — CBC
HCT: 27.7 % — ABNORMAL LOW (ref 36.0–46.0)
Hemoglobin: 8.6 g/dL — ABNORMAL LOW (ref 12.0–15.0)
MCH: 26.4 pg (ref 26.0–34.0)
MCHC: 31 g/dL (ref 30.0–36.0)
MCV: 85 fL (ref 80.0–100.0)
Platelets: 317 K/uL (ref 150–400)
RBC: 3.26 MIL/uL — ABNORMAL LOW (ref 3.87–5.11)
RDW: 17.4 % — ABNORMAL HIGH (ref 11.5–15.5)
WBC: 15.8 K/uL — ABNORMAL HIGH (ref 4.0–10.5)
nRBC: 0 % (ref 0.0–0.2)

## 2023-12-19 LAB — URINE CULTURE: Culture: >=100000 — AB

## 2023-12-19 LAB — RENAL FUNCTION PANEL
Albumin: 2.5 g/dL — ABNORMAL LOW (ref 3.5–5.0)
Anion gap: 20 — ABNORMAL HIGH (ref 5–15)
BUN: 86 mg/dL — ABNORMAL HIGH (ref 8–23)
CO2: 20 mmol/L — ABNORMAL LOW (ref 22–32)
Calcium: 9 mg/dL (ref 8.9–10.3)
Chloride: 91 mmol/L — ABNORMAL LOW (ref 98–111)
Creatinine, Ser: 7.85 mg/dL — ABNORMAL HIGH (ref 0.44–1.00)
GFR, Estimated: 5 mL/min — ABNORMAL LOW (ref >60)
Glucose, Bld: 146 mg/dL — ABNORMAL HIGH (ref 70–99)
Phosphorus: 6 mg/dL — ABNORMAL HIGH (ref 2.5–4.6)
Potassium: 4 mmol/L (ref 3.5–5.1)
Sodium: 131 mmol/L — ABNORMAL LOW (ref 135–145)

## 2023-12-19 LAB — GLUCOSE, CAPILLARY
Glucose-Capillary: 139 mg/dL — ABNORMAL HIGH (ref 70–99)
Glucose-Capillary: 261 mg/dL — ABNORMAL HIGH (ref 70–99)
Glucose-Capillary: 197 mg/dL — ABNORMAL HIGH (ref 70–99)
Glucose-Capillary: 201 mg/dL — ABNORMAL HIGH (ref 70–99)

## 2023-12-19 LAB — APTT: aPTT: 62 s — ABNORMAL HIGH (ref 24–36)

## 2023-12-19 LAB — HEPARIN LEVEL (UNFRACTIONATED): Heparin Unfractionated: >1.1 [IU]/mL — ABNORMAL HIGH (ref 0.30–0.70)

## 2023-12-19 MED ORDER — APIXABAN 5 MG PO TABS
5.0000 mg | ORAL_TABLET | Freq: Two times a day (BID) | ORAL | Status: DC
  Administered 2023-12-19 – 2023-12-25 (×13): 5 mg via ORAL
  Filled 2023-12-19 (×9): qty 1
  Filled 2023-12-19: qty 2
  Filled 2023-12-19 (×3): qty 1

## 2023-12-19 MED ORDER — GLYCERIN (LAXATIVE) 2 G RE SUPP
1.0000 | Freq: Once | RECTAL | Status: AC
  Administered 2023-12-19: 1 via RECTAL
  Filled 2023-12-19: qty 1

## 2023-12-19 MED ORDER — SEVELAMER CARBONATE 800 MG PO TABS
800.0000 mg | ORAL_TABLET | Freq: Three times a day (TID) | ORAL | Status: DC
  Administered 2023-12-19 – 2023-12-25 (×17): 800 mg via ORAL
  Filled 2023-12-19 (×17): qty 1

## 2023-12-19 NOTE — Plan of Care (Signed)
  Problem: Pain Managment: Goal: General experience of comfort will improve and/or be controlled Outcome: Progressing   Problem: Safety: Goal: Ability to remain free from injury will improve Outcome: Progressing   Problem: Skin Integrity: Goal: Risk for impaired skin integrity will decrease Outcome: Progressing

## 2023-12-19 NOTE — Progress Notes (Signed)
 [facility] Progress Note   Subjective:   Patient was seen in her room. Plan for HD this afternoon. She remains on low dose NE for BP support. Her midodrine  was increased yesterday, but she is still needing NE. She remains afebrile. No reported dyspnea and she does not have [name].   Objective Vitals:   12/19/23 0745 12/19/23 0810 12/19/23 0830 12/19/23 1148  BP: (!) 143/122  (!) 97/42   Pulse: 69  70   Resp: 13  17   Temp:  99.7 F (37.6 C)  98.4 F (36.9 C)  TempSrc:  Oral  Oral  SpO2: 100%  100%   Weight:      Height:       General: chronically ill and acutely ill elderly F NAD  HENT: NCAT pink mm anicteric sclera  Lungs: CTAb  Cardiovascular: rr Abdomen: soft round  Extremities: LLE wound with drainage, R BKA  Neuro: Awake following commands, a little disoriented  GU: no foley  Dialysis access: R TDC in place; multiple failed AVF  Additional Objective Labs: Basic Metabolic Panel: Recent Labs  Lab 12/17/23 1145 12/18/23 0356 12/19/23 0544  NA 132* 132* 131*  K 3.8 4.3 4.0  CL 90* 90* 91*  CO2 24 20* 20*  GLUCOSE 238* 235* 146*  BUN 72* 73* 86*  CREATININE 6.28* 6.53* 7.85*  CALCIUM  9.0 8.9 9.0  PHOS  --   --  6.0*   Liver Function Tests: Recent Labs  Lab 12/17/23 1145 12/19/23 0544  AST 28  --   ALT 22  --   ALKPHOS 238*  --   BILITOT 1.1  --   PROT 6.5  --   ALBUMIN  2.3* 2.5*   No results for input(s): LIPASE, AMYLASE in the last 168 hours. CBC: Recent Labs  Lab 12/17/23 1145 12/18/23 0356 12/19/23 0544  WBC 23.6* 23.1* 15.8*  NEUTROABS 21.7*  --   --   HGB 9.9* 10.1* 8.6*  HCT 32.0* 33.2* 27.7*  MCV 87.2 86.9 85.0  PLT 288 345 317   Blood Culture    Component Value Date/Time   SDES  12/17/2023 1332    URINE, CLEAN CATCH Performed at [facility], [address], [place], [place] [address]    SPECREQUEST  12/17/2023 1332    NONE Reflexed from [id number] Performed at [facility], [address] [place],  [place] [address]    CULT >=100,000 COLONIES/mL PROTEUS MIRABILIS (A) 12/17/2023 1332   REPTSTATUS 12/19/2023 FINAL 12/17/2023 1332    Cardiac Enzymes: No results for input(s): CKTOTAL, CKMB, CKMBINDEX, TROPONINI in the last 168 hours. CBG: Recent Labs  Lab 12/18/23 1528 12/18/23 1946 12/18/23 2343 12/19/23 0328 12/19/23 1147  GLUCAP 229* 183* 157* 139* 261*   Iron  Studies: No results for input(s): IRON , TIBC, TRANSFERRIN, FERRITIN in the last 72 hours. @lablastinr3 @ Studies/Results: No results found. Medications:  meropenem  (MERREM ) IV Stopped (12/18/23 1736)   norepinephrine  (LEVOPHED ) Adult infusion 2 mcg/min (12/19/23 0800)    amiodarone   200 mg Oral Daily   apixaban   5 mg Oral BID   calcitRIOL   2.25 mcg Oral Q T,Th,Sa-HD   Chlorhexidine  Gluconate Cloth  6 each Topical Daily   Chlorhexidine  Gluconate Cloth  6 each Topical Q0600   [START ON 12/20/2023] collagenase    Topical Daily   insulin  aspart  0-15 Units Subcutaneous Q4H   insulin  glargine-yfgn  10 Units Subcutaneous Daily   levothyroxine   50 mcg Oral Q0600   liver oil-zinc  oxide   Topical  BID   midodrine   10 mg Oral TID WC   pregabalin   150 mg Oral BID    Dialysis Orders:  [facility] TTS 4 hr - last HD 12/15/23 400 QB/ Manual 800 DFR 2K/2 Ca bath Heparin  4000 U bolus EDW 77.4 Mircera 100 mcg last given 12/01/23 Venofer  100 mg q treatment; last given 12/15/23 Calcitriol  2.5 mcg every treatment Sensipar 30 mg q treatment   Assessment/Plan: Septic shock: unclear if UTI vs cellulitius. Pt with hx of enterococcus bacteremia. Reported recent UTI and staff reports green urine. On Merrem  1g q 24 hours. On low dose NE for BP support with midodrine .   ESRD:  TTS schedule at [facility]. Will support her with HD while she is in the hospital. HD today planned.   Hypertension/volume: On low dose vasopressors. Plan to wean off so she can have HD up on unit. Increased her midodrine  10 mg TID; her home dose in 5 mg  Anemia: On  mircera at OP HD. Last dose 12/01/23  Metabolic bone disease: On calcitriol  2.5 mcg at treatment and sensipar 30 mg at treatment. Phos 6.0 and Ca corrected 10.2. Placed on renal diet today. Started renvela  800 mg TID with meals.   Nutrition:  Switched to renal diet with 1200 mL fluid restriction PAF: On heparin  gtt while in hospital. Patient on eliquis  at home DM2: continue SSI per primary   Belvie Och, NP 12/19/2023, 11:57 AM  [facility]

## 2023-12-19 NOTE — Progress Notes (Signed)
 Assessment & Plan: Sacral decubital Ulcer    73 y.o. female with PMHx of ESRD on HD, DM2 on insulin , CAD, HFrEF, pAF, decub ulcers present on admission.   - sacral decubitus ulceration - CT does not demonstrate underlying bone involvement - WBC 15K this AM, improved - wound care with WTD dressing soaked in Dakins solution X 3 Days followed by Santyl  ointment for wound debridement per WOC RN  - will return Monday to examine with WOC nurse - please notify service of time of dressing change   FEN - Heart diet, IVF VTE - Heparin  infusion  ID - Meropenem  per pharmacy  Dispo - CCM   Per CCM- Anemia ESRD HTN T2DM pAF CHF         Krystal Spinner, MD [facility] practice Office: [phone]        Chief Complaint: Sacral decubitus ulcer  Subjective: Patient in bed, comfortable.  Eating small breakfast.  Objective: Vital signs in last 24 hours: Temp:  [98.2 F (36.8 C)-101.1 F (38.4 C)] 99.7 F (37.6 C) (07/26 0810) Pulse Rate:  [66-80] 68 (07/26 0700) Resp:  [9-26] 17 (07/26 0700) BP: (80-126)/(37-111) 94/43 (07/26 0700) SpO2:  [92 %-100 %] 100 % (07/26 0700) Last BM Date : 12/18/23  Intake/Output from previous day: 07/25 0701 - 07/26 0700 In: 819.8 [P.O.:50; I.V.:489.5; IV Piggyback:280.2] Out: 0  Intake/Output this shift: No intake/output data recorded.  Physical Exam: HEENT - sclerae clear, mucous membranes moist Sacrum - deferred this AM  Lab Results:  Recent Labs    12/18/23 0356 12/19/23 0544  WBC 23.1* 15.8*  HGB 10.1* 8.6*  HCT 33.2* 27.7*  PLT 345 317   BMET Recent Labs    12/18/23 0356 12/19/23 0544  NA 132* 131*  K 4.3 4.0  CL 90* 91*  CO2 20* 20*  GLUCOSE 235* 146*  BUN 73* 86*  CREATININE 6.53* 7.85*  CALCIUM  8.9 9.0   PT/INR Recent Labs    12/17/23 1145  LABPROT 20.2*  INR 1.6*   Comprehensive Metabolic Panel:    Component Value Date/Time   NA 131 (L) 12/19/2023 0544   NA 132 (L) 12/18/2023  0356   K 4.0 12/19/2023 0544   K 4.3 12/18/2023 0356   CL 91 (L) 12/19/2023 0544   CL 90 (L) 12/18/2023 0356   CO2 20 (L) 12/19/2023 0544   CO2 20 (L) 12/18/2023 0356   BUN 86 (H) 12/19/2023 0544   BUN 73 (H) 12/18/2023 0356   CREATININE 7.85 (H) 12/19/2023 0544   CREATININE 6.53 (H) 12/18/2023 0356   CREATININE 6.22 (H) 03/26/2022 1620   CREATININE 5.05 (H) 03/06/2022 1523   GLUCOSE 146 (H) 12/19/2023 0544   GLUCOSE 235 (H) 12/18/2023 0356   GLUCOSE >444 (H) 06/07/2015 1311   GLUCOSE 297 (H) 03/08/2015 1235   CALCIUM  9.0 12/19/2023 0544   CALCIUM  8.9 12/18/2023 0356   AST 28 12/17/2023 1145   AST 17 10/01/2023 1337   ALT 22 12/17/2023 1145   ALT 15 10/01/2023 1337   ALKPHOS 238 (H) 12/17/2023 1145   ALKPHOS 207 (H) 10/01/2023 1337   BILITOT 1.1 12/17/2023 1145   BILITOT 1.0 10/01/2023 1337   PROT 6.5 12/17/2023 1145   PROT 7.5 10/01/2023 1337   ALBUMIN  2.5 (L) 12/19/2023 0544   ALBUMIN  2.3 (L) 12/17/2023 1145    Studies/Results: CT ABDOMEN PELVIS WO CONTRAST Result Date: 12/17/2023 CLINICAL DATA:  Decubitus wounds. EXAM: CT ABDOMEN AND PELVIS WITHOUT CONTRAST TECHNIQUE: Multidetector  CT imaging of the abdomen and pelvis was performed following the standard protocol without IV contrast. RADIATION DOSE REDUCTION: This exam was performed according to the departmental dose-optimization program which includes automated exposure control, adjustment of the mA and/or kV according to patient size and/or use of iterative reconstruction technique. COMPARISON:  Oct 01, 2023. FINDINGS: Lower chest: No acute abnormality. Hepatobiliary: Cholelithiasis. No biliary dilatation. Liver is unremarkable. Pancreas: Unremarkable. No pancreatic ductal dilatation or surrounding inflammatory changes. Spleen: Multiple small calcified splenic granulomas are noted. Adrenals/Urinary Tract: Adrenal glands appear normal. Bilateral renal atrophy is noted consistent with end-stage renal disease. No hydronephrosis or  renal obstruction is noted. Urinary bladder is unremarkable. Stomach/Bowel: Stomach is within normal limits. Appendix appears normal. No evidence of bowel wall thickening, distention, or inflammatory changes. Moderate amount of stool is seen in the sigmoid colon and rectum. Vascular/Lymphatic: Aortic atherosclerosis. No enlarged abdominal or pelvic lymph nodes. Reproductive: Status post hysterectomy. No adnexal masses. Other: No ascites or hernia is noted. Large sacral decubitus ulceration or wound is noted, but does not appear to extend to underlying bone. Musculoskeletal: No acute or significant osseous findings. IMPRESSION: Interval development of large sacral decubitus ulceration or wound is noted described above. Cholelithiasis. Bilateral renal atrophy consistent with history of end-stage renal disease. Moderate amount of stool is seen in sigmoid colon and rectum concerning for possible impaction. Aortic Atherosclerosis (ICD10-I70.0). Electronically Signed   By: Lynwood Landy Raddle M.D.   On: 12/17/2023 11:48   DG Chest Port 1 View Result Date: 12/17/2023 CLINICAL DATA:  . questionable sepsis.  Left large ache.  Confusion EXAM: PORTABLE CHEST 1 VIEW COMPARISON:  Chest x-ray 10/01/2023 FINDINGS: Underinflation. Stable cardiopericardial silhouette with calcified aorta. Bronchovascular crowding. No consolidation, pneumothorax or effusion. Double-lumen right IJ catheter with tip overlying the SVC right atrial junction region. Overlapping cardiac leads. Degenerative changes along the spine. IMPRESSION: Underinflation. Bronchovascular crowding. Double-lumen right IJ catheter. No consolidation. Electronically Signed   By: Ranell Bring M.D.   On: 12/17/2023 11:42      Krystal Spinner 12/19/2023  Patient ID: Nena MARLA Clarity, female   DOB: [birth year], 73 y.o.   MRN: [id number]

## 2023-12-19 NOTE — Progress Notes (Signed)
 NAME:  Susan [name], MRN:  [id number], DOB:  [birth year], LOS: 2 ADMISSION DATE:  12/17/2023, CONSULTATION DATE:  12/17/23 REFERRING MD:  [facility] - [facility] EM, CHIEF COMPLAINT:  wound check // hypotension    History of Present Illness:  73 yo F PMH ESRD on HD, DM2 on insulin , CAD, HFrEF, pAF, decub ulcers POA, hx enterococcus bacteremia who presented to [facility] ED 12/17/23 for wound check. Reportedly incr drainage from sacral decub at home.  Was hypotensive in ED so started on low dose periph pressors WBC 23 , LA initially 2. Nursing notes [facility] green urine. Wounds w c/f cellulitis  CT imaging does not reveal extension of sacral decub to bone   PCCM asked to admit in this setting   Pt is not sure why she is here. She does not know why her husband took her to ED.  Pertinent  Medical History   CAD HFrEF DM2 pAF Decub ulcer ESRD  Bacteremia   Significant Hospital Events: Including procedures, antibiotic start and stop dates in addition to other pertinent events   7/24 admit to [place] for periph pressors 7/25 [facility] consult rec local wound care. Ortho consult recs for wound care, nephro consulted. Pt changed code status to DNR   Interim History / Subjective:  WBC down to 16 Hgb down to 8.6 plt stable   BMP as expected for ESRD due to HD -- high Cr high phos low Na   Objective    Blood pressure (!) 97/42, pulse 70, temperature 99.7 F (37.6 C), temperature source Oral, resp. rate 17, height 5' 5 (1.651 m), weight 77.9 kg, SpO2 100%.        Intake/Output Summary (Last 24 hours) at 12/19/2023 1033 Last data filed at 12/19/2023 0800 Gross per 24 hour  Intake 727.69 ml  Output 0 ml  Net 727.69 ml   Filed Weights   12/17/23 1022 12/17/23 1643 12/18/23 0135  Weight: 104.7 kg 77.9 kg 77.9 kg    Examination: General: chronically and acutely ill F NAD  HENT: NCAT pink mm  Lungs even unlabored  Cardiovascular: cap refill < 3 sec  Abdomen: soft ndnt  Extremities: LLE dressed  Neuro: Very  tired. Awakens easily and is oriented x3  GU: no foley   Resolved problem list  Lactic acidosis  Acute encephalopathy  Assessment and Plan   Hx depression, anxiety, pain  -presume sepsis related  P -delirium precautions -restart home lyrica , robaxin  -BID PRN clonazepam . Doesn't think she's taking her home effexor  so did not order   Septic shock Proteus UTI Possible cellulitis --sacral wound and LLE wound POA, less favored etiology at this point  -hx e coli and enterococcus bacteremia  -recent UTI  -looks like baseline Bps might be variable, seeing some SBP 90s in office notes  P -broad abx -- mero given 07/2023 hx e coli bacteremia resistant to zosyn . Follow bcx -- if remain assuring can consider de-escalation specific to proteus -ortho has seen, LLE not cellulitic seeming, recs for local wound care and office f/u  -CCS has seen: rec local wound care  -cont midodrine  -SBP goal is 90, wean NE as able   ESRD  Hyponatremia  Hyperphosphatemia  P -per nephro   HFrEF  Afib  Chronic AC  P -as no plans for OR, think we can change her back to her home eliquis  from hep gtt.  -amio  Anemia P -follow CBC -- drop 7/25 to 7/26, might be somewhat dilutional    DM2 w  hyperglycemia -SSI + basal   Hypothyroidism -synthroid    DNR status  Best Practice (right click and Reselect all SmartList Selections daily)   Diet/type: Regular consistency (see orders) DVT prophylaxis systemic heparin  -- changing back to eliquis   Pressure ulcer(s): present on admission  GI prophylaxis: N/A Lines: N/A Foley:  N/A Code Status:  DNR  Last date of multidisciplinary goals of care discussion [7/26]  Labs   CBC: Recent Labs  Lab 12/17/23 1145 12/18/23 0356 12/19/23 0544  WBC 23.6* 23.1* 15.8*  NEUTROABS 21.7*  --   --   HGB 9.9* 10.1* 8.6*  HCT 32.0* 33.2* 27.7*  MCV 87.2 86.9 85.0  PLT 288 345 317    Basic Metabolic Panel: Recent Labs  Lab 12/17/23 1145 12/18/23 0356  12/19/23 0544  NA 132* 132* 131*  K 3.8 4.3 4.0  CL 90* 90* 91*  CO2 24 20* 20*  GLUCOSE 238* 235* 146*  BUN 72* 73* 86*  CREATININE 6.28* 6.53* 7.85*  CALCIUM  9.0 8.9 9.0  MG 2.4  --   --   PHOS  --   --  6.0*   GFR: Estimated Creatinine Clearance: 6.5 mL/min (A) (by C-G formula based on SCr of 7.85 mg/dL (H)). Recent Labs  Lab 12/17/23 1145 12/17/23 1352 12/18/23 0356 12/19/23 0544  WBC 23.6*  --  23.1* 15.8*  LATICACIDVEN 2.0* 1.3  --   --     Liver Function Tests: Recent Labs  Lab 12/17/23 1145 12/19/23 0544  AST 28  --   ALT 22  --   ALKPHOS 238*  --   BILITOT 1.1  --   PROT 6.5  --   ALBUMIN  2.3* 2.5*   No results for input(s): LIPASE, AMYLASE in the last 168 hours. No results for input(s): AMMONIA in the last 168 hours.  ABG    Component Value Date/Time   PHART 7.39 06/11/2022 2152   PCO2ART 49 (H) 06/11/2022 2152   PO2ART 110 (H) 06/11/2022 2152   HCO3 26.8 12/17/2023 1213   TCO2 32 07/31/2023 0702   ACIDBASEDEF 0.1 12/17/2023 1213   O2SAT 47.7 12/17/2023 1213     Coagulation Profile: Recent Labs  Lab 12/17/23 1145  INR 1.6*    Cardiac Enzymes: No results for input(s): CKTOTAL, CKMB, CKMBINDEX, TROPONINI in the last 168 hours.  HbA1C: Hgb A1c MFr Bld  Date/Time Value Ref Range Status  10/01/2023 01:37 PM 6.8 (H) 4.8 - 5.6 % Final    Comment:    (NOTE) Pre diabetes:          5.7%-6.4%  Diabetes:              >6.4%  Glycemic control for   <7.0% adults with diabetes   02/27/2023 12:40 PM 9.0 (H) <5.7 % of total Hgb Final    Comment:    For someone without known diabetes, a hemoglobin A1c value of 6.5% or greater indicates that they may have  diabetes and this should be confirmed with a follow-up  test. . For someone with known diabetes, a value <7% indicates  that their diabetes is well controlled and a value  greater than or equal to 7% indicates suboptimal  control. A1c targets should be individualized based on   duration of diabetes, age, comorbid conditions, and  other considerations. . Currently, no consensus exists regarding use of hemoglobin A1c for diagnosis of diabetes for children. .     CBG: Recent Labs  Lab 12/18/23 1147 12/18/23 1528 12/18/23 1946 12/18/23 2343  12/19/23 0328  GLUCAP 234* 229* 183* 157* 139*     Critical care time:     CRITICAL CARE Performed by: Ronnald FORBES Gave   Total critical care time: 37 minutes  Critical care time was exclusive of separately billable procedures and treating other patients. Critical care was necessary to treat or prevent imminent or life-threatening deterioration.  Critical care was time spent personally by me on the following activities: development of treatment plan with patient and/or surrogate as well as nursing, discussions with consultants, evaluation of patient's response to treatment, examination of patient, obtaining history from patient or surrogate, ordering and performing treatments and interventions, ordering and review of laboratory studies, ordering and review of radiographic studies, pulse oximetry and re-evaluation of patient's condition.  Ronnald Gave MSN, AGACNP-BC [place] Amion for pager  12/19/2023, 10:33 AM

## 2023-12-19 NOTE — Plan of Care (Signed)
  Problem: Education: Goal: Knowledge of General Education information will improve Description: Including pain rating scale, medication(s)/side effects and non-pharmacologic comfort measures Outcome: Progressing   Problem: Clinical Measurements: Goal: Ability to maintain clinical measurements within normal limits will improve Outcome: Progressing Goal: Cardiovascular complication will be avoided Outcome: Progressing   Problem: Activity: Goal: Risk for activity intolerance will decrease Outcome: Progressing   

## 2023-12-20 DIAGNOSIS — N390 Urinary tract infection, site not specified: Secondary | ICD-10-CM | POA: Diagnosis not present

## 2023-12-20 DIAGNOSIS — A419 Sepsis, unspecified organism: Secondary | ICD-10-CM | POA: Diagnosis not present

## 2023-12-20 DIAGNOSIS — N186 End stage renal disease: Secondary | ICD-10-CM | POA: Diagnosis not present

## 2023-12-20 DIAGNOSIS — R6521 Severe sepsis with septic shock: Secondary | ICD-10-CM | POA: Diagnosis not present

## 2023-12-20 LAB — GLUCOSE, CAPILLARY
Glucose-Capillary: 167 mg/dL — ABNORMAL HIGH (ref 70–99)
Glucose-Capillary: 177 mg/dL — ABNORMAL HIGH (ref 70–99)
Glucose-Capillary: 182 mg/dL — ABNORMAL HIGH (ref 70–99)
Glucose-Capillary: 183 mg/dL — ABNORMAL HIGH (ref 70–99)
Glucose-Capillary: 184 mg/dL — ABNORMAL HIGH (ref 70–99)
Glucose-Capillary: 192 mg/dL — ABNORMAL HIGH (ref 70–99)
Glucose-Capillary: 215 mg/dL — ABNORMAL HIGH (ref 70–99)

## 2023-12-20 LAB — BASIC METABOLIC PANEL WITH GFR
Anion gap: 20 — ABNORMAL HIGH (ref 5–15)
BUN: 102 mg/dL — ABNORMAL HIGH (ref 8–23)
CO2: 16 mmol/L — ABNORMAL LOW (ref 22–32)
Calcium: 8.9 mg/dL (ref 8.9–10.3)
Chloride: 94 mmol/L — ABNORMAL LOW (ref 98–111)
Creatinine, Ser: 8.58 mg/dL — ABNORMAL HIGH (ref 0.44–1.00)
GFR, Estimated: 4 mL/min — ABNORMAL LOW (ref >60)
Glucose, Bld: 189 mg/dL — ABNORMAL HIGH (ref 70–99)
Potassium: 4.6 mmol/L (ref 3.5–5.1)
Sodium: 130 mmol/L — ABNORMAL LOW (ref 135–145)

## 2023-12-20 LAB — CBC
HCT: 28.3 % — ABNORMAL LOW (ref 36.0–46.0)
Hemoglobin: 8.8 g/dL — ABNORMAL LOW (ref 12.0–15.0)
MCH: 26.3 pg (ref 26.0–34.0)
MCHC: 31.1 g/dL (ref 30.0–36.0)
MCV: 84.7 fL (ref 80.0–100.0)
Platelets: 347 K/uL (ref 150–400)
RBC: 3.34 MIL/uL — ABNORMAL LOW (ref 3.87–5.11)
RDW: 17.6 % — ABNORMAL HIGH (ref 11.5–15.5)
WBC: 16 K/uL — ABNORMAL HIGH (ref 4.0–10.5)
nRBC: 0 % (ref 0.0–0.2)

## 2023-12-20 LAB — HEPATITIS B SURFACE ANTIBODY,QUALITATIVE: Hep B S Ab: REACTIVE — AB

## 2023-12-20 LAB — HEPATITIS B SURFACE ANTIGEN: Hepatitis B Surface Ag: NONREACTIVE

## 2023-12-20 LAB — PARATHYROID HORMONE, INTACT (NO CA): PTH: 121 pg/mL — ABNORMAL HIGH (ref 15–65)

## 2023-12-20 MED ORDER — ALTEPLASE 2 MG IJ SOLR: 2.0000 mg | Freq: Once | INTRAMUSCULAR | Status: DC | PRN

## 2023-12-20 MED ORDER — SODIUM CHLORIDE 0.9 % IV SOLN
2.0000 g | Freq: Every day | INTRAVENOUS | Status: AC
  Administered 2023-12-20 – 2023-12-23 (×4): 2 g via INTRAVENOUS
  Filled 2023-12-20 (×4): qty 20

## 2023-12-20 MED ORDER — HEPARIN SODIUM (PORCINE) 1000 UNIT/ML DIALYSIS: 1000.0000 [IU] | INTRAMUSCULAR | Status: DC | PRN

## 2023-12-20 MED ORDER — HEPARIN SODIUM (PORCINE) 1000 UNIT/ML IJ SOLN
INTRAMUSCULAR | Status: AC
  Filled 2023-12-20: qty 1

## 2023-12-20 MED ORDER — HEPARIN SODIUM (PORCINE) 1000 UNIT/ML IJ SOLN
INTRAMUSCULAR | Status: AC
  Filled 2023-12-20: qty 2

## 2023-12-20 MED ORDER — SODIUM CHLORIDE 0.9 % IV SOLN: 2.0000 g | Freq: Every day | INTRAVENOUS | Status: DC

## 2023-12-20 NOTE — Progress Notes (Signed)
 [facility] Progress Note   Subjective:   Patient was seen in her room. Plan for HD today. She remains on low dose NE for BP support. Her midodrine  was increased yesterday, but she is still needing NE gtt. She remains afebrile. No reported dyspnea and she does thigh edema now.   Objective Vitals:   12/20/23 0845 12/20/23 0900 12/20/23 0915 12/20/23 1134  BP: (!) 93/47 (!) 90/44 (!) 90/48   Pulse: 74 75 70   Resp: 12 12 12    Temp:    100 F (37.8 C)  TempSrc:    Oral  SpO2: 94% 95% 94%   Weight:      Height:       General: chronically ill and acutely ill elderly F NAD  HENT: NCAT pink mm anicteric sclera  Lungs: CTAb  Cardiovascular: rr Abdomen: soft round  Extremities: LLE wound with drainage, R BKA  Neuro: Awake following commands, a little disoriented  GU: no foley  Dialysis access: R TDC in place; multiple failed AVF  Additional Objective Labs: Basic Metabolic Panel: Recent Labs  Lab 12/18/23 0356 12/19/23 0544 12/20/23 0308  NA 132* 131* 130*  K 4.3 4.0 4.6  CL 90* 91* 94*  CO2 20* 20* 16*  GLUCOSE 235* 146* 189*  BUN 73* 86* 102*  CREATININE 6.53* 7.85* 8.58*  CALCIUM  8.9 9.0 8.9  PHOS  --  6.0*  --    Liver Function Tests: Recent Labs  Lab 12/17/23 1145 12/19/23 0544  AST 28  --   ALT 22  --   ALKPHOS 238*  --   BILITOT 1.1  --   PROT 6.5  --   ALBUMIN  2.3* 2.5*   No results for input(s): LIPASE, AMYLASE in the last 168 hours. CBC: Recent Labs  Lab 12/17/23 1145 12/18/23 0356 12/19/23 0544 12/20/23 0308  WBC 23.6* 23.1* 15.8* 16.0*  NEUTROABS 21.7*  --   --   --   HGB 9.9* 10.1* 8.6* 8.8*  HCT 32.0* 33.2* 27.7* 28.3*  MCV 87.2 86.9 85.0 84.7  PLT 288 345 317 347   Blood Culture    Component Value Date/Time   SDES  12/17/2023 1332    URINE, CLEAN CATCH Performed at [facility], [address], [place], [place] [address]    SPECREQUEST  12/17/2023 1332    NONE Reflexed from [id number] Performed at [facility], [address] [place], [place] [address]    CULT >=100,000 COLONIES/mL PROTEUS MIRABILIS (A) 12/17/2023 1332   REPTSTATUS 12/19/2023 FINAL 12/17/2023 1332     CBG: Recent Labs  Lab 12/19/23 1952 12/20/23 0022 12/20/23 0348 12/20/23 0732 12/20/23 1132  GLUCAP 201* 192* 167* 177* 215*    Medications:  cefTRIAXone  (ROCEPHIN )  IV 2 g (12/20/23 1231)   norepinephrine  (LEVOPHED ) Adult infusion 1 mcg/min (12/20/23 0900)    amiodarone   200 mg Oral Daily   apixaban   5 mg Oral BID   calcitRIOL   2.25 mcg Oral Q T,Th,Sa-HD   Chlorhexidine  Gluconate Cloth  6 each Topical Daily   Chlorhexidine  Gluconate Cloth  6 each Topical Q0600   collagenase    Topical Daily   insulin  aspart  0-15 Units Subcutaneous Q4H   insulin  glargine-yfgn  10 Units Subcutaneous Daily   levothyroxine   50 mcg Oral Q0600   liver oil-zinc  oxide   Topical BID   midodrine   10 mg Oral TID WC   pregabalin   150 mg Oral BID   sevelamer  carbonate  800 mg Oral  TID WC    Dialysis Orders:  [facility] TTS 4 hr - last HD 12/15/23 400 QB/ Manual 800 DFR 2K/2 Ca bath Heparin  4000 U bolus EDW 77.4 Mircera 100 mcg last given 12/01/23 Venofer  100 mg q treatment; last given 12/15/23 Calcitriol  2.5 mcg every treatment Sensipar 30 mg q treatment   Assessment/Plan: Septic shock: Presumed from UTI vs cellulitis. Pt with hx of enterococcus bacteremia. Reported recent UTI and staff reports green urine. On Merrem  1g q 24 hours. On low dose NE for BP support with midodrine .   ESRD:  TTS schedule at [facility]. Will support her with HD while she is in the hospital. HD today planned.   Hypertension/volume: On low dose vasopressors. Plan to wean off so she can have HD up on unit. Increased her midodrine  10 mg TID; her home dose in 5 mg  Anemia: On mircera at OP HD. Last dose 12/01/23  Metabolic bone disease: On calcitriol  2.5 mcg at treatment and sensipar 30 mg at treatment. Phos 6.0 and Ca corrected 10.2. On renal diet. Started renvela  800 mg TID with  meals.   Nutrition:  Switched to renal diet with 1200 mL fluid restriction PAF: On heparin  gtt while in hospital. Patient on eliquis  at home DM2: continue SSI per primary   Belvie Och, NP 12/20/2023, 1:11 PM  [facility]

## 2023-12-20 NOTE — Plan of Care (Signed)
   Problem: Education: Goal: Knowledge of General Education information will improve Description: Including pain rating scale, medication(s)/side effects and non-pharmacologic comfort measures Outcome: Progressing   Problem: Clinical Measurements: Goal: Ability to maintain clinical measurements within normal limits will improve Outcome: Progressing   Problem: Nutrition: Goal: Adequate nutrition will be maintained Outcome: Progressing

## 2023-12-20 NOTE — Progress Notes (Signed)
 NAME:  Susan [name], MRN:  [id number], DOB:  07-Sep-1950, LOS: 3 ADMISSION DATE:  12/17/2023, CONSULTATION DATE:  12/17/23 REFERRING MD:  [place] - [facility] EM, CHIEF COMPLAINT:  wound check // hypotension    History of Present Illness:  73 yo F PMH ESRD on HD, DM2 on insulin , CAD, HFrEF, pAF, decub ulcers POA, hx enterococcus bacteremia who presented to [facility] ED 12/17/23 for wound check. Reportedly incr drainage from sacral decub at home.  Was hypotensive in ED so started on low dose periph pressors WBC 23 , LA initially 2. Nursing notes [facility] green urine. Wounds w c/f cellulitis  CT imaging does not reveal extension of sacral decub to bone   PCCM asked to admit in this setting   Pt is not sure why she is here. She does not know why her husband took her to ED.  Pertinent  Medical History   CAD HFrEF DM2 pAF Decub ulcer ESRD  Bacteremia   Significant Hospital Events: Including procedures, antibiotic start and stop dates in addition to other pertinent events   7/24 admit to [place] for periph pressors 7/25 [facility] consult rec local wound care. Ortho consult recs for wound care, nephro consulted. Pt changed code status to DNR  7/26 proteus UTI 7/27 trying to wean pressors abx narrow to rocephin    Interim History / Subjective:  NAEO   1 mcg NE   Objective    Blood pressure (!) 90/48, pulse 70, temperature 98.7 F (37.1 C), temperature source Oral, resp. rate 12, height 5' 5 (1.651 m), weight 78.9 kg, SpO2 94%.        Intake/Output Summary (Last 24 hours) at 12/20/2023 1124 Last data filed at 12/20/2023 0900 Gross per 24 hour  Intake 245.11 ml  Output 0 ml  Net 245.11 ml   Filed Weights   12/17/23 1643 12/18/23 0135 12/20/23 0356  Weight: 77.9 kg 77.9 kg 78.9 kg    Examination: General: chronically and acutely ill F NAD  HENT: NCAT pink mm  Lungs clear, unlabored  Cardiovascular: cap refill < 3 sec  Abdomen: round soft  Extremities: LLE wound is dressed  Neuro: Awake oriented  x3  GU: no foley   Resolved problem list  Lactic acidosis  Acute encephalopathy  Assessment and Plan    Septic shock Proteus UTI Possible cellulitis --sacral wound and LLE wound POA, less favored etiology at this point  -hx e coli and enterococcus bacteremia  -recent UTI  -looks like baseline Bps might be variable, seeing some SBP 90s in office notes  P -narrow abx to rocephin   -ortho has seen, LLE not cellulitic seeming, recs for local wound care and office f/u  -CCS has seen: rec local wound care for sacral wound  -cont midodrine  -SBP goal is 90, wean NE as able   ESRD  AGMA Hyponatremia  Hyperphosphatemia  P -HD per nephro  -BP might be limiting options, her current access would be amenable to CRRT if needed   HFrEF  Afib  Chronic AC  P -amio, eliquis    Anemia -drop from 7/25-26 but stable 26-27  P -follow CBC but think ok to cont eliquis    DM2 w hyperglycemia -SSI + basal semglee  10u daily  Hypothyroidism -synthroid    Hx depression, anxiety, pain  P -delirium precautions -restart home lyrica , robaxin  -BID PRN clonazepam .   DNR status GOC -has said a few times how 'tired of all of this' she is, how hard things are on her body. Talked a little  about broad GOC-- more just talking about how she can have a say in her GOC / medical interventions she wishes to keep pursuing v de-escalate -I havent placed a palli consult, but depending on how she does the next few days could consider this.   Best Practice (right click and Reselect all SmartList Selections daily)   Diet/type: Regular consistency (see orders) DVT prophylaxis: eliquis   Pressure ulcer(s): present on admission  GI prophylaxis: N/A Lines: Dialysis Catheter Foley:  N/A Code Status:  DNR  Last date of multidisciplinary goals of care discussion [7/26]  Labs   CBC: Recent Labs  Lab 12/17/23 1145 12/18/23 0356 12/19/23 0544 12/20/23 0308  WBC 23.6* 23.1* 15.8* 16.0*  NEUTROABS 21.7*   --   --   --   HGB 9.9* 10.1* 8.6* 8.8*  HCT 32.0* 33.2* 27.7* 28.3*  MCV 87.2 86.9 85.0 84.7  PLT 288 345 317 347    Basic Metabolic Panel: Recent Labs  Lab 12/17/23 1145 12/18/23 0356 12/19/23 0544 12/20/23 0308  NA 132* 132* 131* 130*  K 3.8 4.3 4.0 4.6  CL 90* 90* 91* 94*  CO2 24 20* 20* 16*  GLUCOSE 238* 235* 146* 189*  BUN 72* 73* 86* 102*  CREATININE 6.28* 6.53* 7.85* 8.58*  CALCIUM  9.0 8.9 9.0 8.9  MG 2.4  --   --   --   PHOS  --   --  6.0*  --    GFR: Estimated Creatinine Clearance: 6 mL/min (A) (by C-G formula based on SCr of 8.58 mg/dL (H)). Recent Labs  Lab 12/17/23 1145 12/17/23 1352 12/18/23 0356 12/19/23 0544 12/20/23 0308  WBC 23.6*  --  23.1* 15.8* 16.0*  LATICACIDVEN 2.0* 1.3  --   --   --     Liver Function Tests: Recent Labs  Lab 12/17/23 1145 12/19/23 0544  AST 28  --   ALT 22  --   ALKPHOS 238*  --   BILITOT 1.1  --   PROT 6.5  --   ALBUMIN  2.3* 2.5*   No results for input(s): LIPASE, AMYLASE in the last 168 hours. No results for input(s): AMMONIA in the last 168 hours.  ABG    Component Value Date/Time   PHART 7.39 06/11/2022 2152   PCO2ART 49 (H) 06/11/2022 2152   PO2ART 110 (H) 06/11/2022 2152   HCO3 26.8 12/17/2023 1213   TCO2 32 07/31/2023 0702   ACIDBASEDEF 0.1 12/17/2023 1213   O2SAT 47.7 12/17/2023 1213     Coagulation Profile: Recent Labs  Lab 12/17/23 1145  INR 1.6*    Cardiac Enzymes: No results for input(s): CKTOTAL, CKMB, CKMBINDEX, TROPONINI in the last 168 hours.  HbA1C: Hgb A1c MFr Bld  Date/Time Value Ref Range Status  10/01/2023 01:37 PM 6.8 (H) 4.8 - 5.6 % Final    Comment:    (NOTE) Pre diabetes:          5.7%-6.4%  Diabetes:              >6.4%  Glycemic control for   <7.0% adults with diabetes   02/27/2023 12:40 PM 9.0 (H) <5.7 % of total Hgb Final    Comment:    For someone without known diabetes, a hemoglobin A1c value of 6.5% or greater indicates that they may have   diabetes and this should be confirmed with a follow-up  test. . For someone with known diabetes, a value <7% indicates  that their diabetes is well controlled and a value  greater  than or equal to 7% indicates suboptimal  control. A1c targets should be individualized based on  duration of diabetes, age, comorbid conditions, and  other considerations. . Currently, no consensus exists regarding use of hemoglobin A1c for diagnosis of diabetes for children. .     CBG: Recent Labs  Lab 12/19/23 1552 12/19/23 1952 12/20/23 0022 12/20/23 0348 12/20/23 0732  GLUCAP 197* 201* 192* 167* 177*     Critical care time:     CRITICAL CARE Performed by: Ronnald FORBES Gave   Total critical care time: 36  Critical care time was exclusive of separately billable procedures and treating other patients. Critical care was necessary to treat or prevent imminent or life-threatening deterioration.  Critical care was time spent personally by me on the following activities: development of treatment plan with patient and/or surrogate as well as nursing, discussions with consultants, evaluation of patient's response to treatment, examination of patient, obtaining history from patient or surrogate, ordering and performing treatments and interventions, ordering and review of laboratory studies, ordering and review of radiographic studies, pulse oximetry and re-evaluation of patient's condition.  Ronnald Gave MSN, AGACNP-BC [facility] Amion for pager  12/20/2023, 11:24 AM

## 2023-12-20 NOTE — Progress Notes (Signed)
   12/20/23 1740  Vitals  Temp 97.9 F (36.6 C)  Pulse Rate 69  Resp (!) 9  BP (!) 123/56  SpO2 100 %  Oxygen  Therapy  Patient Activity (if Appropriate) In bed  Pulse Oximetry Type Continuous  During Treatment Monitoring  Blood Flow Rate (mL/min) 0 mL/min  Arterial Pressure (mmHg) -0.8 mmHg  Venous Pressure (mmHg) -1.82 mmHg  TMP (mmHg) 9.09 mmHg  Ultrafiltration Rate (mL/min) 920 mL/min  Dialysate Flow Rate (mL/min) 299 ml/min  Dialysate Potassium Concentration 2  Dialysate Calcium  Concentration 2.5  Duration of HD Treatment -hour(s) 2.5 hour(s)  Cumulative Fluid Removed (mL) per Treatment  2000.18  Intra-Hemodialysis Comments Tx completed   Pt tolerated tx well, no issues noted, uf goal met, denies SOB, cath hep locked

## 2023-12-21 DIAGNOSIS — N390 Urinary tract infection, site not specified: Secondary | ICD-10-CM | POA: Diagnosis not present

## 2023-12-21 DIAGNOSIS — A419 Sepsis, unspecified organism: Secondary | ICD-10-CM | POA: Diagnosis not present

## 2023-12-21 DIAGNOSIS — R6521 Severe sepsis with septic shock: Secondary | ICD-10-CM | POA: Diagnosis not present

## 2023-12-21 DIAGNOSIS — N186 End stage renal disease: Secondary | ICD-10-CM | POA: Diagnosis not present

## 2023-12-21 LAB — BASIC METABOLIC PANEL WITH GFR
Anion gap: 18 — ABNORMAL HIGH (ref 5–15)
BUN: 66 mg/dL — ABNORMAL HIGH (ref 8–23)
CO2: 20 mmol/L — ABNORMAL LOW (ref 22–32)
Calcium: 8.8 mg/dL — ABNORMAL LOW (ref 8.9–10.3)
Chloride: 92 mmol/L — ABNORMAL LOW (ref 98–111)
Creatinine, Ser: 6.6 mg/dL — ABNORMAL HIGH (ref 0.44–1.00)
GFR, Estimated: 6 mL/min — ABNORMAL LOW (ref >60)
Glucose, Bld: 193 mg/dL — ABNORMAL HIGH (ref 70–99)
Potassium: 4 mmol/L (ref 3.5–5.1)
Sodium: 130 mmol/L — ABNORMAL LOW (ref 135–145)

## 2023-12-21 LAB — GLUCOSE, CAPILLARY
Glucose-Capillary: 189 mg/dL — ABNORMAL HIGH (ref 70–99)
Glucose-Capillary: 174 mg/dL — ABNORMAL HIGH (ref 70–99)
Glucose-Capillary: 236 mg/dL — ABNORMAL HIGH (ref 70–99)
Glucose-Capillary: 274 mg/dL — ABNORMAL HIGH (ref 70–99)
Glucose-Capillary: 274 mg/dL — ABNORMAL HIGH (ref 70–99)
Glucose-Capillary: 291 mg/dL — ABNORMAL HIGH (ref 70–99)

## 2023-12-21 LAB — CBC WITH DIFFERENTIAL/PLATELET
Abs Immature Granulocytes: 0.14 K/uL — ABNORMAL HIGH (ref 0.00–0.07)
Basophils Absolute: 0.1 K/uL (ref 0.0–0.1)
Basophils Relative: 0 %
Eosinophils Absolute: 0.1 K/uL (ref 0.0–0.5)
Eosinophils Relative: 1 %
HCT: 29.8 % — ABNORMAL LOW (ref 36.0–46.0)
Hemoglobin: 9.2 g/dL — ABNORMAL LOW (ref 12.0–15.0)
Immature Granulocytes: 1 %
Lymphocytes Relative: 4 %
Lymphs Abs: 0.6 K/uL — ABNORMAL LOW (ref 0.7–4.0)
MCH: 25.9 pg — ABNORMAL LOW (ref 26.0–34.0)
MCHC: 30.9 g/dL (ref 30.0–36.0)
MCV: 83.9 fL (ref 80.0–100.0)
Monocytes Absolute: 1.3 K/uL — ABNORMAL HIGH (ref 0.1–1.0)
Monocytes Relative: 9 %
Neutro Abs: 11.7 K/uL — ABNORMAL HIGH (ref 1.7–7.7)
Neutrophils Relative %: 85 %
Platelets: 338 K/uL (ref 150–400)
RBC: 3.55 MIL/uL — ABNORMAL LOW (ref 3.87–5.11)
RDW: 17.3 % — ABNORMAL HIGH (ref 11.5–15.5)
WBC: 13.9 K/uL — ABNORMAL HIGH (ref 4.0–10.5)
nRBC: 0 % (ref 0.0–0.2)

## 2023-12-21 MED ORDER — PREGABALIN 25 MG PO CAPS: 75.0000 mg | ORAL_CAPSULE | Freq: Two times a day (BID) | ORAL | Status: DC

## 2023-12-21 MED ORDER — DARBEPOETIN ALFA 100 MCG/0.5ML IJ SOSY
100.0000 ug | PREFILLED_SYRINGE | INTRAMUSCULAR | Status: DC
  Administered 2023-12-22: 100 ug via SUBCUTANEOUS
  Filled 2023-12-21: qty 0.5

## 2023-12-21 MED ORDER — PREGABALIN 75 MG PO CAPS
75.0000 mg | ORAL_CAPSULE | Freq: Every day | ORAL | Status: DC
  Administered 2023-12-21: 75 mg via ORAL
  Filled 2023-12-21: qty 1
  Filled 2023-12-21: qty 3

## 2023-12-21 MED ORDER — OXYCODONE-ACETAMINOPHEN 5-325 MG PO TABS
1.0000 | ORAL_TABLET | ORAL | Status: DC | PRN
  Administered 2023-12-21 – 2023-12-22 (×4): 1 via ORAL
  Filled 2023-12-21 (×4): qty 1

## 2023-12-21 MED ORDER — CLONAZEPAM 0.25 MG PO TBDP
0.5000 mg | ORAL_TABLET | Freq: Two times a day (BID) | ORAL | Status: DC
  Administered 2023-12-21 – 2023-12-23 (×5): 0.5 mg via ORAL
  Filled 2023-12-21: qty 1
  Filled 2023-12-21: qty 2
  Filled 2023-12-21: qty 4
  Filled 2023-12-21 (×2): qty 2

## 2023-12-21 NOTE — Progress Notes (Signed)
 Pt transferred from MICU, alert and oriented x2, forgetful. Vitals obtained. Skin assessed. Dressings intact.  Spouse at bedside. Pt in no acute distress. Plan of care continued. Safety and fall precautions maintained.

## 2023-12-21 NOTE — Evaluation (Signed)
 Physical Therapy Evaluation Patient Details Name: Susan [name] MRN: [id number] DOB: 1950-11-02 Today's Date: 12/21/2023  History of Present Illness  73 yo F who presented to [facility] ED 12/17/23 for wound check. Reportedly incr drainage from sacral decub at home. Recent UTI. Transferred to [facility] for Septic shock,  Proteus UTI.  PMH ESRD on HD, DM2 on insulin , CAD, HFrEF, pAF, decub ulcers POA, hx enterococcus bacteremia   Clinical Impression  Pt admitted with above diagnosis. Prior to admission husband states pt was ambulating with prosthesis up to 30 feet with AD and chair follow. Still getting prosthesis adjusted for final fittings but working hard on recovery and regain of function. She required up to max assist for bed mobility today. Fairly lethargic and a bit confused, likely attributing to some of her weakness. Able to sit EOB with CGA and perform some LE exercises. Extensive education for pressure relief schedule with pt and husband. Would benefit from post acute inpatient rehab but husband wishes to take her home and continue with home health therapies. Will progress as tolerated during admission. Pt currently with functional limitations due to the deficits listed below (see PT Problem List). Pt will benefit from acute skilled PT to increase their independence and safety with mobility to allow discharge.           If plan is discharge home, recommend the following: Two people to help with walking and/or transfers;Two people to help with bathing/dressing/bathroom;Assistance with cooking/housework;Assist for transportation;Help with stairs or ramp for entrance   Can travel by private vehicle        Equipment Recommendations Other (comment) (Pending progress; if husband takes her home may need hoyer lift if it will fit; but hopeful she will progress prior to d/c.)  Recommendations for Other Services       Functional Status Assessment Patient has had a recent decline in their functional status  and demonstrates the ability to make significant improvements in function in a reasonable and predictable amount of time.     Precautions / Restrictions Precautions Precautions: Fall Recall of Precautions/Restrictions: Impaired Precaution/Restrictions Comments: Sacral Wound, wound to Lt calf. Hx Rt BKA. Chronic Rt RCT weakness. Restrictions Weight Bearing Restrictions Per Provider Order: No      Mobility  Bed Mobility Overal bed mobility: Needs Assistance Bed Mobility: Rolling, Sidelying to Sit, Sit to Sidelying Rolling: Max assist Sidelying to sit: Max assist     Sit to sidelying: Mod assist General bed mobility comments: Max assist to roll, rise, and mod assist to lower back down onto side with LE lift into bed. Educated pt and wife about log roll techniques to reduce friction across sacral wound. Cues throughout. Pt has trouble reaching with Rt shoulder but can grasp with hand.    Transfers                   General transfer comment: Deferred due to lethargy; husband to bring in Rt prosthesis.    Ambulation/Gait                  Stairs            Wheelchair Mobility     Tilt Bed    Modified Rankin (Stroke Patients Only)       Balance Overall balance assessment: Needs assistance Sitting-balance support: No upper extremity supported, Feet supported Sitting balance-Leahy Scale: Fair Sitting balance - Comments: Sat EOB with CGA  Pertinent Vitals/Pain Pain Assessment Pain Assessment: Faces Faces Pain Scale: Hurts little more Pain Location: generalized with any movement Pain Descriptors / Indicators: Grimacing Pain Intervention(s): Monitored during session, Repositioned, Limited activity within patient's tolerance    Home Living Family/patient expects to be discharged to:: Private residence Living Arrangements: Spouse/significant other Available Help at Discharge: Family;Available 24  hours/day Type of Home: Mobile home Home Access: Ramped entrance       Home Layout: One level Home Equipment: Grab bars - tub/shower;Rolling Walker (2 wheels);Rollator (4 wheels);BSC/3in1;Cane - quad;Shower seat;Wheelchair - manual;Hospital bed Additional Comments: per chart    Prior Function Prior Level of Function : Needs assist             Mobility Comments: Short household distances using RW and RLE BKA prosthetec leg, uses wheelchair mostly ADLs Comments: Requires assist for ADLs bed level, pt reports self feeding, grooming and managing UB ADLs. Using brief to have BM and husband A with cleaning, rolling in bed to assist with spouse donning LB clothing.     Extremity/Trunk Assessment   Upper Extremity Assessment Upper Extremity Assessment: Defer to OT evaluation RUE Deficits / Details: Chronic RCT.  Globally weak 3+/5 RUE Sensation: WNL RUE Coordination: WNL    Lower Extremity Assessment Lower Extremity Assessment: Generalized weakness (Hx of Rt BKA, wears shrinker sock)    Cervical / Trunk Assessment Cervical / Trunk Assessment: Kyphotic  Communication   Communication Communication: No apparent difficulties    Cognition Arousal: Lethargic Behavior During Therapy: WFL for tasks assessed/performed   PT - Cognitive impairments: Awareness, Memory, Attention, Initiation, Sequencing, Problem solving                       PT - Cognition Comments: Fairly lethargic, delayed. Following commands: Impaired Following commands impaired: Only follows one step commands consistently, Follows one step commands with increased time     Cueing Cueing Techniques: Verbal cues     General Comments General comments (skin integrity, edema, etc.): Husband present, supportive. BM noted end of session - NT notified via secure chat. SW in room speaking with pt and husband    Exercises General Exercises - Lower Extremity Ankle Circles/Pumps: AROM, Left, 10 reps,  Seated Long Arc Quad: Strengthening, Both, Seated, 10 reps Hip ABduction/ADduction: Strengthening, Both, 5 reps, Seated   Assessment/Plan    PT Assessment Patient needs continued PT services  PT Problem List Decreased strength;Decreased range of motion;Decreased activity tolerance;Decreased balance;Decreased mobility;Decreased cognition;Decreased knowledge of use of DME;Decreased knowledge of precautions;Obesity;Decreased skin integrity;Pain       PT Treatment Interventions DME instruction;Gait training;Functional mobility training;Therapeutic activities;Therapeutic exercise;Balance training;Neuromuscular re-education;Cognitive remediation;Patient/family education;Wheelchair mobility training;Modalities    PT Goals (Current goals can be found in the Care Plan section)  Acute Rehab PT Goals Patient Stated Goal: get well PT Goal Formulation: With patient Time For Goal Achievement: 01/04/24 Potential to Achieve Goals: Good    Frequency Min 2X/week     Co-evaluation               AM-PAC PT 6 Clicks Mobility  Outcome Measure Help needed turning from your back to your side while in a flat bed without using bedrails?: A Lot Help needed moving from lying on your back to sitting on the side of a flat bed without using bedrails?: A Lot Help needed moving to and from a bed to a chair (including a wheelchair)?: Total Help needed standing up from a chair using your arms (e.g., wheelchair or bedside  chair)?: Total Help needed to walk in hospital room?: Total Help needed climbing 3-5 steps with a railing? : Total 6 Click Score: 8    End of Session   Activity Tolerance: Patient limited by lethargy Patient left: in bed;with call bell/phone within reach;with bed alarm set;with family/visitor present (SW) Nurse Communication:  (NT notified of BM; front desk called) PT Visit Diagnosis: Other abnormalities of gait and mobility (R26.89);Muscle weakness (generalized) (M62.81);Difficulty in  walking, not elsewhere classified (R26.2);Pain Pain - part of body:  (sacrum)    Time: 8459-8441 PT Time Calculation (min) (ACUTE ONLY): 18 min   Charges:   PT Evaluation $PT Eval Moderate Complexity: 1 Mod   PT General Charges $$ ACUTE PT VISIT: 1 Visit         Leontine Roads, PT, DPT [facility] Physical Therapist Office: [phone] Website: [email or web address]   Leontine GORMAN Roads 12/21/2023, 4:18 PM

## 2023-12-21 NOTE — Progress Notes (Signed)
 Pt receives OP HD TTS at [facility], chair time 1130. Will assist as needed.   Susan [name] Dialysis navigator [email or web address]

## 2023-12-21 NOTE — Progress Notes (Signed)
 Subjective: CC: Seen with RN for evaluation of sacral wound.   Objective: Vital signs in last 24 hours: Temp:  [97.8 F (36.6 C)-98.5 F (36.9 C)] 98.5 F (36.9 C) (07/28 0800) Pulse Rate:  [59-195] 72 (07/28 0800) Resp:  [7-18] 8 (07/28 0800) BP: (90-123)/(43-72) 93/48 (07/28 0800) SpO2:  [92 %-100 %] 92 % (07/28 0800) Weight:  [79.2 kg] 79.2 kg (07/28 0438) Last BM Date : 12/20/23  Intake/Output from previous day: 07/27 0701 - 07/28 0700 In: 149.9 [I.V.:32.9; IV Piggyback:117] Out: 2000  Intake/Output this shift: No intake/output data recorded.  PE: Gen: Awake and alert, NAD Wound: As noted below. Sacral wound with overlying leathery dry eschar. No surrounding cellulitis. No fluctuance or drainage. She did report sensation with light touch near edges of eschar.  Msk: R BKA   Lab Results:  Recent Labs    12/20/23 0308 12/21/23 0520  WBC 16.0* 13.9*  HGB 8.8* 9.2*  HCT 28.3* 29.8*  PLT 347 338   BMET Recent Labs    12/20/23 0308 12/21/23 0520  NA 130* 130*  K 4.6 4.0  CL 94* 92*  CO2 16* 20*  GLUCOSE 189* 193*  BUN 102* 66*  CREATININE 8.58* 6.60*  CALCIUM  8.9 8.8*   PT/INR No results for input(s): LABPROT, INR in the last 72 hours. CMP     Component Value Date/Time   NA 130 (L) 12/21/2023 0520   K 4.0 12/21/2023 0520   CL 92 (L) 12/21/2023 0520   CO2 20 (L) 12/21/2023 0520   GLUCOSE 193 (H) 12/21/2023 0520   GLUCOSE >444 (H) 06/07/2015 1311   BUN 66 (H) 12/21/2023 0520   CREATININE 6.60 (H) 12/21/2023 0520   CREATININE 6.22 (H) 03/26/2022 1620   CALCIUM  8.8 (L) 12/21/2023 0520   PROT 6.5 12/17/2023 1145   ALBUMIN  2.5 (L) 12/19/2023 0544   AST 28 12/17/2023 1145   ALT 22 12/17/2023 1145   ALKPHOS 238 (H) 12/17/2023 1145   BILITOT 1.1 12/17/2023 1145   GFRNONAA 6 (L) 12/21/2023 0520   GFRNONAA 11 (L) 11/15/2020 1452   GFRAA 13 (L) 11/15/2020 1452   Lipase     Component Value Date/Time   LIPASE 17 08/05/2023 1440     Studies/Results: No results found.  Anti-infectives: Anti-infectives (From admission, onward)    Start     Dose/Rate Route Frequency Ordered Stop   12/20/23 1115  cefTRIAXone  (ROCEPHIN ) 2 g in sodium chloride  0.9 % 100 mL IVPB  Status:  Discontinued        2 g 200 mL/hr over 30 Minutes Intravenous Daily 12/20/23 1027 12/20/23 1027   12/20/23 1115  cefTRIAXone  (ROCEPHIN ) 2 g in sodium chloride  0.9 % 100 mL IVPB        2 g 200 mL/hr over 30 Minutes Intravenous Daily 12/20/23 1028 12/24/23 0959   12/17/23 1800  meropenem  (MERREM ) 1 g in sodium chloride  0.9 % 100 mL IVPB  Status:  Discontinued        1 g 200 mL/hr over 30 Minutes Intravenous Every 24 hours 12/17/23 1713 12/20/23 1027   12/17/23 1713  vancomycin  variable dose per unstable renal function (pharmacist dosing)  Status:  Discontinued         Does not apply See admin instructions 12/17/23 1713 12/18/23 0905   12/17/23 1130  vancomycin  (VANCOREADY) IVPB 2000 mg/400 mL        2,000 mg 200 mL/hr over 120 Minutes Intravenous  Once 12/17/23 1044 12/17/23 1405  12/17/23 1045  vancomycin  (VANCOCIN ) IVPB 1000 mg/200 mL premix  Status:  Discontinued        1,000 mg 200 mL/hr over 60 Minutes Intravenous  Once 12/17/23 1036 12/17/23 1044   12/17/23 1045  piperacillin -tazobactam (ZOSYN ) IVPB 3.375 g        3.375 g 100 mL/hr over 30 Minutes Intravenous  Once 12/17/23 1036 12/17/23 1218        Assessment/Plan Sacral decubital Ulcer  - CT 7/24 without obvious abscess - Discussed with patient about bedside debridement. She declines at this time. I do not think she needs any emergent debridement.  - Cont wound care as outlined by WOCN with santyl  BID for chemical debridement.  - Recommend frequent turns and air mattress for pressure displacement.  - Recommend follow up at wound care center.  - General surgery will sign off. Please call back with questions or concerns.    FEN - Renal VTE - On Eliquis  ID - Rocephin  for Proteus  Mirabilius UTI   Per CCM- ESRD TTS HTN T2DM PAF Hypothyroidism  CHF   I reviewed nursing notes, hospitalist notes, last 24 h vitals and pain scores, last 48 h intake and output, last 24 h labs and trends, and last 24 h imaging results.   LOS: 4 days    Ozell CHRISTELLA Shaper, [facility] Surgery 12/21/2023, 12:10 PM Please see Amion for pager number during day hours 7:00am-4:30pm

## 2023-12-21 NOTE — Progress Notes (Signed)
 [facility] Progress Note   Subjective:   Patient states she is doing okay today except for pain in her legs.  Also noticed shaking of her right hand which is new since around the time she got admitted  Objective Vitals:   12/21/23 0438 12/21/23 0500 12/21/23 0700 12/21/23 0800  BP:  (!) 98/47 (!) 93/44 (!) 93/48  Pulse:  70 66 72  Resp:  (!) 8 13 (!) 8  Temp:    98.5 F (36.9 C)  TempSrc:    Axillary  SpO2:  95% 92% 92%  Weight: 79.2 kg     Height:       General: chronically ill and acutely ill elderly F NAD  HENT: NCAT, EOMI Lungs: Lateral chest rise with no increased work of breathing Cardiovascular: Normal rate with no rub Abdomen: soft, nondistended Extremities: LLE wound with drainage, R BKA  Neuro: Awake and alert, no obvious deficits GU: no foley  Dialysis access: R TDC in place; multiple failed AVF  Additional Objective Labs: Basic Metabolic Panel: Recent Labs  Lab 12/19/23 0544 12/20/23 0308 12/21/23 0520  NA 131* 130* 130*  K 4.0 4.6 4.0  CL 91* 94* 92*  CO2 20* 16* 20*  GLUCOSE 146* 189* 193*  BUN 86* 102* 66*  CREATININE 7.85* 8.58* 6.60*  CALCIUM  9.0 8.9 8.8*  PHOS 6.0*  --   --    Liver Function Tests: Recent Labs  Lab 12/17/23 1145 12/19/23 0544  AST 28  --   ALT 22  --   ALKPHOS 238*  --   BILITOT 1.1  --   PROT 6.5  --   ALBUMIN  2.3* 2.5*   No results for input(s): LIPASE, AMYLASE in the last 168 hours. CBC: Recent Labs  Lab 12/17/23 1145 12/18/23 0356 12/19/23 0544 12/20/23 0308 12/21/23 0520  WBC 23.6* 23.1* 15.8* 16.0* 13.9*  NEUTROABS 21.7*  --   --   --  11.7*  HGB 9.9* 10.1* 8.6* 8.8* 9.2*  HCT 32.0* 33.2* 27.7* 28.3* 29.8*  MCV 87.2 86.9 85.0 84.7 83.9  PLT 288 345 317 347 338   Blood Culture    Component Value Date/Time   SDES  12/17/2023 1332    URINE, CLEAN CATCH Performed at [facility], [address], [place], [place] [address]    SPECREQUEST  12/17/2023 1332    NONE Reflexed  from [id number] Performed at [facility], [address], [place], [place] [address]    CULT >=100,000 COLONIES/mL PROTEUS MIRABILIS (A) 12/17/2023 1332   REPTSTATUS 12/19/2023 FINAL 12/17/2023 1332     CBG: Recent Labs  Lab 12/20/23 1519 12/20/23 1946 12/20/23 2346 12/21/23 0341 12/21/23 0748  GLUCAP 183* 182* 184* 189* 174*    Medications:  cefTRIAXone  (ROCEPHIN )  IV Stopped (12/20/23 1309)   norepinephrine  (LEVOPHED ) Adult infusion 1 mcg/min (12/20/23 1700)    amiodarone   200 mg Oral Daily   apixaban   5 mg Oral BID   calcitRIOL   2.25 mcg Oral Q T,Th,Sa-HD   Chlorhexidine  Gluconate Cloth  6 each Topical Daily   Chlorhexidine  Gluconate Cloth  6 each Topical Q0600   clonazepam   0.5 mg Oral BID   collagenase    Topical Daily   insulin  aspart  0-15 Units Subcutaneous Q4H   insulin  glargine-yfgn  10 Units Subcutaneous Daily   levothyroxine   50 mcg Oral Q0600   liver oil-zinc  oxide   Topical BID   midodrine   10 mg Oral TID WC   pregabalin   75 mg  Oral Daily   sevelamer  carbonate  800 mg Oral TID WC    Dialysis Orders:  [facility] TTS 4 hr - last HD 12/15/23 400 QB/ Manual 800 DFR 2K/2 Ca bath Heparin  4000 U bolus EDW 77.4 Mircera 100 mcg last given 12/01/23 Venofer  100 mg q treatment; last given 12/15/23 Calcitriol  2.5 mcg every treatment Sensipar 30 mg q treatment   Assessment/Plan: Septic shock: Presumed from UTI vs cellulitis. Pt with hx of enterococcus bacteremia.  Continue antibiotics per primary team.  No longer on pressors.  On midodrine   ESRD:  TTS schedule at [facility].  Continue dialysis per schedule  Hypertension/volume: Continue midodrine .  Volume removal on dialysis as tolerated  Anemia: On mircera at OP HD. Will dose with HD tomorrow  Metabolic bone disease: On calcitriol  2.5 mcg at treatment and sensipar 30 mg at treatment. Started sevelamer .  PAF: anticoag per primary DM2: continue SSI per primary   12/21/2023, 10:17 AM  [employer]

## 2023-12-21 NOTE — Progress Notes (Signed)
 Transition of Care [facility]) - Inpatient Brief Assessment   Patient Details  Name: Susan [name] MRN: [id number] Date of Birth: [birth year]  [facility] CM/SW Contact:    Susan JONELLE Joe, RN Phone Number: 12/21/2023, 4:05 PM   Clinical Narrative: I met with the patient and spouse at the bedside to discuss patient's plan and patient/spouse state that they plan for patient to discharge home with home health services when stable.  The patient's spouse states that patient was active with wound care clinic but did not have home health services prior to hospitalization admission.  Patient is recommended for home health and patient's spouse was offered Medicare choice regarding home health and patient/spouse did not have a preference.  I called [facility] and [place], CM accepted for [facility] RN, PT.  HH orders placed to be co-signed by MD.  DME at the home includes hospital bedm WC, shower seat, cane, 3:1, Rolator, RW and slide board.  Patient's spouse states that he plans to provide transportation home in family WC fleeta when patient is stable to discharge from the hospital.  Transition of Care Asessment: Insurance and Status: (P) Insurance coverage has been reviewed Patient has primary care physician: (P) Yes Home environment has been reviewed: (P) from home with spouse Prior level of function:: (P) WC Prior/Current Home Services: (P) No current home services (Patient is not active with home health prior to hospitalization) Social Drivers of Health Review: (P) SDOH reviewed needs interventions Readmission risk has been reviewed: (P) Yes Transition of care needs: (P) transition of care needs identified, TOC will continue to follow

## 2023-12-21 NOTE — Evaluation (Addendum)
 Occupational Therapy Evaluation Patient Details Name: Susan [name] MRN: [id number] DOB: [birth year] Today's Date: 12/21/2023   History of Present Illness   73 yo F adm 7/24.  PMH ESRD on HD, DM2 on insulin , CAD, HFrEF, pAF, decub ulcers POA, hx enterococcus bacteremia who presented to [facility] ED 12/17/23 for wound check. Reportedly incr drainage from sacral decub at home. Recent UTI     Clinical Impressions Patient admitted for the diagnosis above.  PTA she resides at home with her spouse, who assists with lower body ADL, transfers, hygiene and community mobility.  Spouse is not interested in SNF, wants to take her home with [name] therapies until her new prosthetic is fit, and return to outpatient PT.  Currently she is weak and lethargic, Max A for rolling, attempted EOB sitting.  Surgical team in the room to look at sacral wound.  OT to continue efforts in the acute setting to address deficits, and HH OT will be recommended for now.  Per the spouse: patient could walk up to 59' with RW, gait belt and wheelchair follow.  Patient performed her own upper body ADL seated, and lower body ADL is at bedlevel.       If plan is discharge home, recommend the following:   Help with stairs or ramp for entrance;Assist for transportation;Assistance with cooking/housework;A lot of help with walking and/or transfers;A lot of help with bathing/dressing/bathroom     Functional Status Assessment   Patient has had a recent decline in their functional status and demonstrates the ability to make significant improvements in function in a reasonable and predictable amount of time.     Equipment Recommendations   None recommended by OT     Recommendations for Other Services         Precautions/Restrictions   Precautions Precautions: Fall Precaution/Restrictions Comments: Sacral Wound Restrictions Weight Bearing Restrictions Per Provider Order: No     Mobility Bed Mobility Overal bed mobility:  Needs Assistance Bed Mobility: Rolling Rolling: Max assist              Transfers                          Balance                                           ADL either performed or assessed with clinical judgement   ADL Overall ADL's : Needs assistance/impaired Eating/Feeding: Minimal assistance;Bed level   Grooming: Wash/dry hands;Wash/dry face;Minimal assistance;Bed level   Upper Body Bathing: Moderate assistance;Bed level   Lower Body Bathing: Maximal assistance;Bed level   Upper Body Dressing : Moderate assistance;Bed level   Lower Body Dressing: Maximal assistance;Bed level                       Vision Patient Visual Report: No change from baseline       Perception Perception: Not tested       Praxis Praxis: Not tested       Pertinent Vitals/Pain Pain Assessment Pain Assessment: Faces Faces Pain Scale: Hurts little more Pain Location: generalized with any movement Pain Descriptors / Indicators: Grimacing Pain Intervention(s): Monitored during session     Extremity/Trunk Assessment Upper Extremity Assessment Upper Extremity Assessment: Generalized weakness;Right hand dominant;RUE deficits/detail RUE Deficits / Details: Chronic RCT.  Globally weak 3+/5 RUE Sensation: WNL  RUE Coordination: WNL   Lower Extremity Assessment Lower Extremity Assessment: Defer to PT evaluation   Cervical / Trunk Assessment Cervical / Trunk Assessment: Kyphotic   Communication Communication Communication: No apparent difficulties   Cognition Arousal: Lethargic Behavior During Therapy: WFL for tasks assessed/performed Cognition: History of cognitive impairments                               Following commands: Intact       Cueing  General Comments   Cueing Techniques: Verbal cues   VSS on RA   Exercises     Shoulder Instructions      Home Living Family/patient expects to be discharged to:: Private  residence Living Arrangements: Spouse/significant other Available Help at Discharge: Family;Available 24 hours/day Type of Home: Mobile home Home Access: Ramped entrance     Home Layout: One level     Bathroom Shower/Tub: Tub/shower unit;Door   [employer] Toilet: Handicapped height Bathroom Accessibility: Yes How Accessible: Accessible via walker Home Equipment: Grab bars - tub/shower;Rolling Walker (2 wheels);Rollator (4 wheels);BSC/3in1;Cane - quad;Shower seat;Wheelchair - manual;Hospital bed          Prior Functioning/Environment Prior Level of Function : Needs assist             Mobility Comments: Short household distances using RW and RLE BKA prosthetec leg, uses wheelchair mostly ADLs Comments: Requires assist for ADLs bed level, pt reports self feeding, grooming and managing UB ADLs. Using brief to have BM and husband A with cleaning, rolling in bed to assist with spouse donning LB clothing.    OT Problem List: Decreased strength;Decreased range of motion;Decreased activity tolerance;Impaired balance (sitting and/or standing);Decreased knowledge of use of DME or AE;Decreased cognition;Pain;Increased edema   OT Treatment/Interventions: Self-care/ADL training;Therapeutic activities;Patient/family education;DME and/or AE instruction;Balance training      OT Goals(Current goals can be found in the care plan section)   Acute Rehab OT Goals Patient Stated Goal: Return home OT Goal Formulation: With patient Time For Goal Achievement: 01/04/24 Potential to Achieve Goals: Fair ADL Goals Pt Will Perform Eating: with set-up;sitting Pt Will Perform Grooming: with set-up;sitting Pt Will Perform Upper Body Bathing: with set-up;sitting Pt Will Perform Upper Body Dressing: with set-up;sitting Pt Will Transfer to Toilet: with mod assist;stand pivot transfer;bedside commode   OT Frequency:  Min 2X/week    Co-evaluation              AM-PAC OT 6 Clicks Daily Activity      Outcome Measure Help from another person eating meals?: A Little Help from another person taking care of personal grooming?: A Little Help from another person toileting, which includes using toliet, bedpan, or urinal?: A Lot Help from another person bathing (including washing, rinsing, drying)?: A Lot Help from another person to put on and taking off regular upper body clothing?: A Lot Help from another person to put on and taking off regular lower body clothing?: A Lot 6 Click Score: 14   End of Session Nurse Communication: Mobility status  Activity Tolerance: Patient tolerated treatment well Patient left: in bed;with family/visitor present  OT Visit Diagnosis: Unsteadiness on feet (R26.81);Muscle weakness (generalized) (M62.81);History of falling (Z91.81)                Time: 1440-1500 OT Time Calculation (min): 20 min Charges:  OT General Charges $OT Visit: 1 Visit OT Evaluation $OT Eval Moderate Complexity: 1 Mod  12/21/2023  RP, OTR/L  Acute  Rehabilitation Services  Office:  [phone]   Charlie JONETTA Halsted 12/21/2023, 3:12 PM

## 2023-12-21 NOTE — Progress Notes (Signed)
 NAME:  Susan [name], MRN:  [id number], DOB:  January 17, 1951, LOS: 4 ADMISSION DATE:  12/17/2023  History of Present Illness:  73 yo F PMH ESRD on HD, DM2 on insulin , CAD, HFrEF, pAF, decub ulcers POA, hx enterococcus bacteremia who presented to [facility] ED 12/17/23 for wound check due to increased drainage from sacral decub at home.  Was hypotensive in ED so started on low dose periph pressors and empiric antibiotics   Pertinent  Medical History  CAD HFrEF DM2 pAF Decub ulcer ESRD  Bacteremia   Significant Hospital Events: Including procedures, antibiotic start and stop dates in addition to other pertinent events   7/24 admit to [place] for periph pressors 7/25 [facility] consult rec local wound care. Ortho consult recs for wound care, nephro consulted. Pt changed code status to DNR  7/26 proteus UTI 7/27 trying to wean pressors abx narrow to rocephin   7/ 28 off pressors, stable for floor transfer  Interim History / Subjective:  NAEON. No complaints this morning  Objective    Blood pressure (!) 93/48, pulse 72, temperature 98.5 F (36.9 C), temperature source Axillary, resp. rate (!) 8, height 5' 5 (1.651 m), weight 79.2 kg, SpO2 92%.        Intake/Output Summary (Last 24 hours) at 12/21/2023 0854 Last data filed at 12/21/2023 0700 Gross per 24 hour  Intake 146.13 ml  Output 2000 ml  Net -1853.87 ml   Filed Weights   12/18/23 0135 12/20/23 0356 12/21/23 0438  Weight: 77.9 kg 78.9 kg 79.2 kg    Examination: General: no acute distress, sitting up in bed comfortably HENT: MMM Lungs: CTAB, breathing comfortably on RA Cardiovascular: RRR, no murmurs Abdomen: soft, non-distended Extremities: LLE dressed wound, R BKA Neuro: A&Ox3  Resolved problem list  Lactic acidosis Acute encephalopathy  Assessment and Plan  Septic shock Proteus UTI Possible cellulitis --sacral wound and LLE wound POA, less likely source  Off pressors, stable for floor transfer. Ortho recommended wound care and  outpatient follow up.  SBP goal 90 Continue Midodrine , consider tapering off of this as BP allows Ceftriaxone  (7/27-)  ESRD  AGMA Hyponatremia  Hyperphosphatemia  HD per nephro    HFrEF  Afib  Chronic AC  Cont Eliquis  BID Amiodarone  daily   Anemia Stable CTM CBC   DM2 w hyperglycemia SSI + basal semglee  10u daily Diabetes coordinator consult placed, pt on insulin  pump at home  Hx depression, anxiety, pain  Delirium precautions Decreased home Lyrica  to 75mg  daily due to renal function Continue home clonazepam , robaxin     Hypothyroidism Cont home Synthroid   Best Practice (right click and Reselect all SmartList Selections daily)   Diet/type: Regular consistency (see orders) DVT prophylaxis DOAC Pressure ulcer(s): present on admission  GI prophylaxis: N/A Lines: N/A Foley:  N/A Code Status:  limited Last date of multidisciplinary goals of care discussion [7/25]  Labs   CBC: Recent Labs  Lab 12/17/23 1145 12/18/23 0356 12/19/23 0544 12/20/23 0308 12/21/23 0520  WBC 23.6* 23.1* 15.8* 16.0* 13.9*  NEUTROABS 21.7*  --   --   --  11.7*  HGB 9.9* 10.1* 8.6* 8.8* 9.2*  HCT 32.0* 33.2* 27.7* 28.3* 29.8*  MCV 87.2 86.9 85.0 84.7 83.9  PLT 288 345 317 347 338    Basic Metabolic Panel: Recent Labs  Lab 12/17/23 1145 12/18/23 0356 12/19/23 0544 12/20/23 0308 12/21/23 0520  NA 132* 132* 131* 130* 130*  K 3.8 4.3 4.0 4.6 4.0  CL 90* 90* 91* 94* 92*  CO2  24 20* 20* 16* 20*  GLUCOSE 238* 235* 146* 189* 193*  BUN 72* 73* 86* 102* 66*  CREATININE 6.28* 6.53* 7.85* 8.58* 6.60*  CALCIUM  9.0 8.9 9.0 8.9 8.8*  MG 2.4  --   --   --   --   PHOS  --   --  6.0*  --   --    GFR: Estimated Creatinine Clearance: 7.8 mL/min (A) (by C-G formula based on SCr of 6.6 mg/dL (H)). Recent Labs  Lab 12/17/23 1145 12/17/23 1352 12/18/23 0356 12/19/23 0544 12/20/23 0308 12/21/23 0520  WBC 23.6*  --  23.1* 15.8* 16.0* 13.9*  LATICACIDVEN 2.0* 1.3  --   --   --   --      Liver Function Tests: Recent Labs  Lab 12/17/23 1145 12/19/23 0544  AST 28  --   ALT 22  --   ALKPHOS 238*  --   BILITOT 1.1  --   PROT 6.5  --   ALBUMIN  2.3* 2.5*   No results for input(s): LIPASE, AMYLASE in the last 168 hours. No results for input(s): AMMONIA in the last 168 hours.  ABG    Component Value Date/Time   PHART 7.39 06/11/2022 2152   PCO2ART 49 (H) 06/11/2022 2152   PO2ART 110 (H) 06/11/2022 2152   HCO3 26.8 12/17/2023 1213   TCO2 32 07/31/2023 0702   ACIDBASEDEF 0.1 12/17/2023 1213   O2SAT 47.7 12/17/2023 1213     Coagulation Profile: Recent Labs  Lab 12/17/23 1145  INR 1.6*    Cardiac Enzymes: No results for input(s): CKTOTAL, CKMB, CKMBINDEX, TROPONINI in the last 168 hours.  HbA1C: Hgb A1c MFr Bld  Date/Time Value Ref Range Status  10/01/2023 01:37 PM 6.8 (H) 4.8 - 5.6 % Final    Comment:    (NOTE) Pre diabetes:          5.7%-6.4%  Diabetes:              >6.4%  Glycemic control for   <7.0% adults with diabetes   02/27/2023 12:40 PM 9.0 (H) <5.7 % of total Hgb Final    Comment:    For someone without known diabetes, a hemoglobin A1c value of 6.5% or greater indicates that they may have  diabetes and this should be confirmed with a follow-up  test. . For someone with known diabetes, a value <7% indicates  that their diabetes is well controlled and a value  greater than or equal to 7% indicates suboptimal  control. A1c targets should be individualized based on  duration of diabetes, age, comorbid conditions, and  other considerations. . Currently, no consensus exists regarding use of hemoglobin A1c for diagnosis of diabetes for children. .     CBG: Recent Labs  Lab 12/20/23 1519 12/20/23 1946 12/20/23 2346 12/21/23 0341 12/21/23 0748  GLUCAP 183* 182* 184* 189* 174*    Review of Systems:   N/a  Past Medical History:  She,  has a past medical history of Allergy, Anemia, Anxiety, Arthritis, CAD  (coronary artery disease), Carotid artery disease (HCC), CHF (congestive heart failure) (HCC), Chronic diastolic heart failure (HCC), Clotting disorder (HCC), Daily headache, Depression, Diabetic retinopathy (HCC), Dyslipidemia, ESRD (end stage renal disease) (HCC), Exertional shortness of breath, GERD (gastroesophageal reflux disease), History of blood transfusion, History of kidney stones, HTN (hypertension), Hypothyroidism, Myocardial infarction (HCC), Obesity, Osteoarthritis, Oxygen  deficiency, PAF (paroxysmal atrial fibrillation) (HCC), Peripheral neuropathy, PONV (postoperative nausea and vomiting), RBBB (right bundle branch block), Stroke (HCC), and Type  II diabetes mellitus (HCC).   Surgical History:   Past Surgical History:  Procedure Laterality Date   A/V FISTULAGRAM Left 11/07/2022   Procedure: A/V Fistulagram;  Surgeon: Magda Debby SAILOR, MD;  Location: [facility] INVASIVE CV LAB;  Service: Cardiovascular;  Laterality: Left;   ABDOMINAL HYSTERECTOMY  1980's   AMPUTATION Right 02/24/2018   Procedure: RIGHT FOOT GREAT TOE AND 2ND TOE AMPUTATION;  Surgeon: Harden Jerona GAILS, MD;  Location: [facility] OR;  Service: Orthopedics;  Laterality: Right;   AMPUTATION Right 04/30/2018   Procedure: RIGHT TRANSMETATARSAL AMPUTATION;  Surgeon: Harden Jerona GAILS, MD;  Location: [facility] OR;  Service: Orthopedics;  Laterality: Right;   AMPUTATION Right 05/02/2022   Procedure: RIGHT BELOW KNEE AMPUTATION;  Surgeon: Harden Jerona GAILS, MD;  Location: [facility] OR;  Service: Orthopedics;  Laterality: Right;   APPLICATION OF WOUND VAC Right 06/13/2022   Procedure: APPLICATION OF WOUND VAC;  Surgeon: Harden Jerona GAILS, MD;  Location: [facility] OR;  Service: Orthopedics;  Laterality: Right;   APPLICATION OF WOUND VAC Left 11/14/2022   Procedure: APPLICATION OF WOUND VAC;  Surgeon: Harden Jerona GAILS, MD;  Location: [facility] OR;  Service: Orthopedics;  Laterality: Left;   AV FISTULA PLACEMENT Left 04/02/2022   Procedure: LEFT ARM ARTERIOVENOUS (AV) FISTULA CREATION;   Surgeon: Serene Gaile ORN, MD;  Location: [facility] OR;  Service: Vascular;  Laterality: Left;  PERIPHERAL NERVE BLOCK   AV FISTULA PLACEMENT Right 04/27/2023   Procedure: RIGHT ARM BRACHIOBASILIC ARTERIOVENOUS (AV) FISTULA CREATION;  Surgeon: Magda Debby SAILOR, MD;  Location: [facility] OR;  Service: Vascular;  Laterality: Right;   BASCILIC VEIN TRANSPOSITION Left 07/31/2022   Procedure: LEFT ARM SECOND STAGE BASILIC VEIN TRANSPOSITION;  Surgeon: Serene Gaile ORN, MD;  [facility]: [facility] OR;  Service: Vascular;  Laterality: Left;   BIOPSY  05/27/2020   Procedure: BIOPSY;  Surgeon: Cindie Carlin POUR, DO;  [facility]: [facility]O [facility];  Service: Endoscopy;;   CATARACT EXTRACTION, BILATERAL Bilateral ?2013   COLONOSCOPY W/ POLYPECTOMY     COLONOSCOPY WITH PROPOFOL  N/A 03/13/2019   Procedure: COLONOSCOPY WITH PROPOFOL ;  Surgeon: Albertus Gordy HERO, MD;  Location: [facility] ENDOSCOPY;  Service: Gastroenterology;  Laterality: N/A;   CORONARY ANGIOPLASTY WITH STENT PLACEMENT  1999; 2007   1 + 1   ERCP N/A 02/03/2022   Procedure: ENDOSCOPIC RETROGRADE CHOLANGIOPANCREATOGRAPHY (ERCP);  Surgeon: Rollin Dover, MD;  Location: [facility] ENDOSCOPY;  Service: Gastroenterology;  Laterality: N/A;   ESOPHAGOGASTRODUODENOSCOPY N/A 02/12/2023   Procedure: ESOPHAGOGASTRODUODENOSCOPY (EGD);  Surgeon: Rollin Dover, MD;  Location: [facility] ENDOSCOPY;  Service: Gastroenterology;  Laterality: N/A;   ESOPHAGOGASTRODUODENOSCOPY (EGD) WITH PROPOFOL  N/A 03/13/2019   Procedure: ESOPHAGOGASTRODUODENOSCOPY (EGD) WITH PROPOFOL ;  Surgeon: Albertus Gordy HERO, MD;  Location: [facility] ENDOSCOPY;  Service: Gastroenterology;  Laterality: N/A;   ESOPHAGOGASTRODUODENOSCOPY (EGD) WITH PROPOFOL  N/A 05/27/2020   Procedure: ESOPHAGOGASTRODUODENOSCOPY (EGD) WITH PROPOFOL ;  Surgeon: Cindie Carlin POUR, DO;  [facility];  Service: Endoscopy;  Laterality: N/A;   ESOPHAGOGASTRODUODENOSCOPY (EGD) WITH PROPOFOL  N/A 09/03/2022   Procedure: ESOPHAGOGASTRODUODENOSCOPY (EGD) WITH PROPOFOL ;  Surgeon: Rollin Dover, MD;  Location: [facility] ENDOSCOPY;  Service: Gastroenterology;  Laterality: N/A;   EYE SURGERY Bilateral    lazer   FLEXIBLE SIGMOIDOSCOPY N/A 05/23/2022   Procedure: FLEXIBLE SIGMOIDOSCOPY;  Surgeon: Rollin Dover, MD;  Location: [facility] ENDOSCOPY;  Service: Gastroenterology;  Laterality: N/A;   FLEXIBLE SIGMOIDOSCOPY N/A 05/24/2022   Procedure: FLEXIBLE SIGMOIDOSCOPY;  Surgeon: Federico Rosario BROCKS, MD;  Location: [facility] ENDOSCOPY;  Service: Gastroenterology;  Laterality: N/A;   FLEXIBLE SIGMOIDOSCOPY N/A 09/03/2022  Procedure: FLEXIBLE SIGMOIDOSCOPY;  Surgeon: Rollin Dover, MD;  Location: [facility] ENDOSCOPY;  Service: Gastroenterology;  Laterality: N/A;   HEMOSTASIS CLIP PLACEMENT  03/13/2019   Procedure: HEMOSTASIS CLIP PLACEMENT;  Surgeon: Albertus Gordy HERO, MD;  Location: [facility] ENDOSCOPY;  Service: Gastroenterology;;   HEMOSTASIS CLIP PLACEMENT  05/23/2022   Procedure: HEMOSTASIS CLIP PLACEMENT;  Surgeon: Rollin Dover, MD;  Location: [facility] ENDOSCOPY;  Service: Gastroenterology;;   HEMOSTASIS CONTROL  05/24/2022   Procedure: HEMOSTASIS CONTROL;  Surgeon: Federico Rosario BROCKS, MD;  Location: [facility] ENDOSCOPY;  Service: Gastroenterology;;   HOT HEMOSTASIS N/A 05/23/2022   Procedure: HOT HEMOSTASIS (ARGON PLASMA COAGULATION/BICAP);  Surgeon: Rollin Dover, MD;  Location: [facility] ENDOSCOPY;  Service: Gastroenterology;  Laterality: N/A;   I & D EXTREMITY Left 05/05/2022   Procedure: IRRIGATION AND DEBRIDEMENT LEFT ARM AV FISTULA;  Surgeon: Gretta Lonni PARAS, MD;  Location: [facility] OR;  Service: Vascular;  Laterality: Left;   I & D EXTREMITY N/A 11/14/2022   Procedure: IRRIGATION AND DEBRIDEMENT OF LOWER EXTREMITY WOUND;  Surgeon: Harden Jerona GAILS, MD;  Location: [facility] OR;  Service: Orthopedics;  Laterality: N/A;   INSERTION OF DIALYSIS CATHETER Right 04/02/2022   Procedure: INSERTION OF TUNNELED DIALYSIS CATHETER;  Surgeon: Serene Gaile ORN, MD;  Location: [facility] OR;  Service: Vascular;  Laterality: Right;   IR FLUORO GUIDE CV LINE RIGHT   08/10/2023   IR VENOCAVAGRAM IVC  08/10/2023   KNEE ARTHROSCOPY Left 10/25/2006   POLYPECTOMY  03/13/2019   Procedure: POLYPECTOMY;  Surgeon: Albertus Gordy HERO, MD;  Location: [facility] ENDOSCOPY;  Service: Gastroenterology;;   REMOVAL OF STONES  02/03/2022   Procedure: REMOVAL OF STONES;  Surgeon: Rollin Dover, MD;  Location: [facility] ENDOSCOPY;  Service: Gastroenterology;;   REVISON OF ARTERIOVENOUS FISTULA Left 08/20/2022   Procedure: REVISON OF LEFT ARM ARTERIOVENOUS FISTULA;  Surgeon: Serene Gaile ORN, MD;  Location[facility] OR;  Service: Vascular;  Laterality: Left;   RIGHT HEART CATH N/A 07/24/2017   Procedure: RIGHT HEART CATH;  Surgeon: Cherrie Toribio SAUNDERS, MD;  Location[facility] INVASIVE CV LAB;  Service: Cardiovascular;  Laterality: N/A;   RIGHT HEART CATHETERIZATION N/A 09/22/2013   Procedure: RIGHT HEART CATH;  Surgeon: Toribio SAUNDERS Cherrie, MD;  Location: [facility] CATH LAB;  Service: Cardiovascular;  Laterality: N/A;   SHOULDER ARTHROSCOPY WITH OPEN ROTATOR CUFF REPAIR Right 03/14/2014   Procedure: RIGHT SHOULDER ARTHROSCOPY WITH BICEPS RELEASE, OPEN SUBSCAPULA REPAIR, OPEN SUPRASPINATUS REPAIR.;  Surgeon: Cordella Glendia Hutchinson, MD;  Location: [facility] OR;  Service: Orthopedics;  Laterality: Right;   SPHINCTEROTOMY  02/03/2022   Procedure: SPHINCTEROTOMY;  Surgeon: Rollin Dover, MD;  Location: [facility] ENDOSCOPY;  Service: Gastroenterology;;   STUMP REVISION Right 06/13/2022   Procedure: REVISION RIGHT BELOW KNEE AMPUTATION;  Surgeon: Harden Jerona GAILS, MD;  Location: [facility] OR;  Service: Orthopedics;  Laterality: Right;   TEE WITHOUT CARDIOVERSION N/A 02/04/2022   Procedure: TRANSESOPHAGEAL ECHOCARDIOGRAM (TEE);  Surgeon: Cherrie Toribio SAUNDERS, MD;  Location: [facility] ENDOSCOPY;  Service: Cardiovascular;  Laterality: N/A;   THROMBECTOMY W/ EMBOLECTOMY Left 08/20/2022   Procedure: THROMBECTOMY OF LEFT ARM ARTERIOVENOUS FISTULA;  Surgeon: Serene Gaile ORN, MD;  Location: [facility] OR;  Service: Vascular;  Laterality: Left;   TOE AMPUTATION Right 02/24/2018    GREAT TOE AND 2ND TOE AMPUTATION   TUBAL LIGATION  1970's   UPPER EXTREMITY VENOGRAPHY N/A 07/31/2023   Procedure: UPPER EXTREMITY VENOGRAPHY;  Surgeon: Magda Debby SAILOR, MD;  Location: [facility] INVASIVE CV LAB;  Service: Cardiovascular;  Laterality: N/A;     Social History:  reports that she quit smoking about 26 years ago. Her smoking use included cigarettes. She started smoking about 58 years ago. She has a 96 pack-year smoking history. She has never used smokeless tobacco. She reports that she does not currently use alcohol. She reports that she does not use drugs.   Family History:  Her family history includes Heart attack (age of onset: 101) in her mother.   Allergies Allergies  Allergen Reactions   Codeine Nausea And Vomiting   Keflex  [Cephalexin ] Diarrhea   Macrobid  [Nitrofurantoin ] Diarrhea and Nausea And Vomiting     Home Medications  Prior to Admission medications   Medication Sig Start Date End Date Taking? Authorizing Provider  acetaminophen  (TYLENOL ) 500 MG tablet Take 1,000 mg by mouth every 6 (six) hours as needed for moderate pain (pain score 4-6), fever or headache.   Yes [provider]  albuterol  (VENTOLIN  HFA) 108 (90 Base) MCG/ACT inhaler Inhale 1-2 puffs into the lungs every 6 (six) hours as needed for shortness of breath. 08/14/23  Yes Duanne Butler DASEN, MD  amiodarone  (PACERONE ) 200 MG tablet Take 1 tablet (200 mg total) by mouth daily. 08/14/23  Yes Duanne Butler DASEN, MD  apixaban  (ELIQUIS ) 5 MG TABS tablet Take 1 tablet (5 mg total) by mouth 2 (two) times daily. 08/14/23  Yes Duanne Butler DASEN, MD  Ascorbic Acid  (VITAMIN C  PO) Take 1 tablet by mouth daily.   Yes [provider]  atorvastatin  (LIPITOR ) 80 MG tablet Take 1 tablet (80 mg total) by mouth daily. 08/14/23  Yes Duanne Butler DASEN, MD  B Complex-C-Folic Acid  (DIALYVITE TABLET) TABS Take 1 tablet by mouth daily.   Yes [provider]  calcitRIOL  (ROCALTROL ) 0.25 MCG capsule Take 9  capsules (2.25 mcg total) by mouth Every Tuesday,Thursday,and Saturday with dialysis. Patient taking differently: Take 0.25 mcg by mouth daily. 08/15/23  Yes Dahal, Chapman, MD  clonazePAM  (KLONOPIN ) 0.5 MG disintegrating tablet Take 1 tablet (0.5 mg total) by mouth 2 (two) times daily. Stop lorazepam . 11/19/23  Yes [name], Butler DASEN, MD  folic acid  (FOLVITE ) 1 MG tablet TAKE 1 TABLET BY MOUTH EVERY DAY 12/15/23  Yes Duanne Butler DASEN, MD  insulin  aspart (NOVOLOG ) 100 UNIT/ML injection Inject 100 Units into the skin at bedtime. Load insulin  pump with 100 units, use over night.   Yes [provider]  lactulose  (CHRONULAC ) 10 GM/15ML solution Take 20 g by mouth daily as needed (constipation).   Yes [provider]  MELATONIN PO Take 1 tablet by mouth at bedtime.   Yes [provider]  methocarbamol  (ROBAXIN ) 750 MG tablet TAKE 1 TABLET (750 MG TOTAL) BY MOUTH EVERY 6 (SIX) HOURS AS NEEDED FOR MUSCLE SPASMS 12/01/23  Yes Duanne Butler DASEN, MD  Methoxy PEG-Epoetin  Beta (MIRCERA IJ) Mircera 02/05/23 02/04/24 Yes [provider]  midodrine  (PROAMATINE ) 10 MG tablet Take 1-2 tablets (10-20 mg total) by mouth Every Tuesday,Thursday,and Saturday with dialysis. 08/15/23  Yes Duanne Butler DASEN, MD  Multiple Vitamins-Minerals (ZINC  PO) Take 1 tablet by mouth daily.   Yes [provider]  nitroGLYCERIN  (NITROSTAT ) 0.4 MG SL tablet Place 1 tablet (0.4 mg total) under the tongue every 5 (five) minutes as needed. 08/14/23 01/30/24 Yes Duanne Butler DASEN, MD  nystatin  cream (MYCOSTATIN ) Apply 1 Application topically 2 (two) times daily.   Yes [provider]  oxyCODONE -acetaminophen  (PERCOCET/ROXICET) 5-325 MG tablet Take 1 tablet by mouth every 6 (six) hours as needed for severe pain (pain score 7-10).   Yes [provider]  OXYGEN  Inhale 2 L/min into the lungs at bedtime.   Yes [provider]  pantoprazole  (PROTONIX ) 40 MG tablet Take 1 tablet (40 mg total)  by mouth daily. 08/14/23  Yes Duanne Butler DASEN, MD  polyethylene glycol (MIRALAX  / GLYCOLAX ) 17 g packet Take 17 g by mouth daily. Patient taking differently: Take 17 g by mouth daily as needed (constipation). 12/10/22  Yes Angiulli, Toribio PARAS, PA-C  pregabalin  (LYRICA ) 150 MG capsule Take 150 mg by mouth 2 (two) times daily. 11/20/23  Yes [provider]  sevelamer  carbonate (RENVELA ) 800 MG tablet Take 800-1,600 mg by mouth See admin instructions. Take 2 tablets (1600mg ) by mouth three times daily with meals and take 1 tablet (800mg ) daily as needed for snacks.   Yes [provider]  venlafaxine  XR (EFFEXOR -XR) 75 MG 24 hr capsule Take 1 capsule (75 mg total) by mouth daily with breakfast. Stop trintellix  08/14/23  Yes [name], Butler DASEN, MD  Insulin  Disposable Pump (OMNIPOD 5 [id number] PODS GEN 5) MISC Inject 1 Application into the skin 3 days. 12/13/23   [provider]  insulin  glargine (LANTUS  SOLOSTAR) 100 UNIT/ML Solostar Pen Inject 40-44 Units into the skin at bedtime as needed (insulin  pump malfunction). Patient not taking: Reported on 12/18/2023    [provider]  levothyroxine  (SYNTHROID ) 50 MCG tablet Take 1 tablet (50 mcg total) by mouth daily before breakfast. 08/14/23   Duanne Butler DASEN, MD  nitrofurantoin , macrocrystal-monohydrate, (MACROBID ) 100 MG capsule Take 1 capsule (100 mg total) by mouth daily. Patient not taking: Reported on 12/18/2023 12/11/23   Aguiar, Rafaela, MD  ofloxacin (OCUFLOX) 0.3 % ophthalmic solution Place 1 drop into the left eye 4 (four) times daily. Patient not taking: Reported on 12/18/2023 12/13/23   [provider]  prednisoLONE acetate (PRED FORTE) 1 % ophthalmic suspension 1 drop 4 (four) times daily. Patient not taking: Reported on 12/18/2023    [provider]  sulfamethoxazole -trimethoprim  (BACTRIM  DS) 800-160 MG tablet Take 1 tablet by mouth 2 (two) times daily. Patient not taking: Reported on 12/18/2023  12/14/23   Duanne Butler DASEN, MD     Critical care time: 30 minutes

## 2023-12-21 NOTE — Inpatient Diabetes Management (Signed)
 Inpatient Diabetes Program Recommendations  AACE/ADA: New Consensus Statement on Inpatient Glycemic Control (2015)  Target Ranges:  Prepandial:   less than 140 mg/dL      Peak postprandial:   less than 180 mg/dL (1-2 hours)      Critically ill patients:  140 - 180 mg/dL   Lab Results  Component Value Date   GLUCAP 236 (H) 12/21/2023   HGBA1C 6.8 (H) 10/01/2023    Review of Glycemic Control  Latest Reference Range & Units 12/21/23 03:41 12/21/23 07:48 12/21/23 11:43  Glucose-Capillary 70 - 99 mg/dL 810 (H) 825 (H) 763 (H)   Diabetes history: DM 1 Outpatient Diabetes medications:  Omnipod insulin  pump/Novolog Lenard Libre Basal rates: 12a-8a   0.3 units/hour 8a-8p     0.5 units/hour 8p-12a   0.3 units/hour Total basal insulin  in a 24 hour period 9.6 units Carbohydrate coverage 1 unit of insulin  for every 18-20 grams of carbohydrates Correction/Sensitivity: 1 unit of insulin  drops glucose 65-70 points Insulin  duration 5 hours Current orders for Inpatient glycemic control:  Novolog  0-15 units q 4 hours Semglee  10 units daily Inpatient Diabetes Program Recommendations:   May consider increasing Semglee  to 15 units daily.   Thanks,  Randall Bullocks, RN, BC-ADM Inpatient Diabetes Coordinator Pager [phone]  (8a-5p)

## 2023-12-22 DIAGNOSIS — D638 Anemia in other chronic diseases classified elsewhere: Secondary | ICD-10-CM

## 2023-12-22 DIAGNOSIS — I480 Paroxysmal atrial fibrillation: Secondary | ICD-10-CM

## 2023-12-22 DIAGNOSIS — I5023 Acute on chronic systolic (congestive) heart failure: Secondary | ICD-10-CM

## 2023-12-22 DIAGNOSIS — N186 End stage renal disease: Secondary | ICD-10-CM | POA: Diagnosis not present

## 2023-12-22 DIAGNOSIS — A419 Sepsis, unspecified organism: Secondary | ICD-10-CM | POA: Diagnosis not present

## 2023-12-22 DIAGNOSIS — Z794 Long term (current) use of insulin: Secondary | ICD-10-CM

## 2023-12-22 LAB — CBC
HCT: 30.1 % — ABNORMAL LOW (ref 36.0–46.0)
Hemoglobin: 9.4 g/dL — ABNORMAL LOW (ref 12.0–15.0)
MCH: 26.4 pg (ref 26.0–34.0)
MCHC: 31.2 g/dL (ref 30.0–36.0)
MCV: 84.6 fL (ref 80.0–100.0)
Platelets: 324 K/uL (ref 150–400)
RBC: 3.56 MIL/uL — ABNORMAL LOW (ref 3.87–5.11)
RDW: 17.5 % — ABNORMAL HIGH (ref 11.5–15.5)
WBC: 10.8 K/uL — ABNORMAL HIGH (ref 4.0–10.5)
nRBC: 0 % (ref 0.0–0.2)

## 2023-12-22 LAB — GLUCOSE, CAPILLARY
Glucose-Capillary: 117 mg/dL — ABNORMAL HIGH (ref 70–99)
Glucose-Capillary: 149 mg/dL — ABNORMAL HIGH (ref 70–99)
Glucose-Capillary: 153 mg/dL — ABNORMAL HIGH (ref 70–99)
Glucose-Capillary: 170 mg/dL — ABNORMAL HIGH (ref 70–99)
Glucose-Capillary: 172 mg/dL — ABNORMAL HIGH (ref 70–99)
Glucose-Capillary: 174 mg/dL — ABNORMAL HIGH (ref 70–99)
Glucose-Capillary: 188 mg/dL — ABNORMAL HIGH (ref 70–99)

## 2023-12-22 LAB — AMMONIA: Ammonia: 16 umol/L (ref 9–35)

## 2023-12-22 LAB — BASIC METABOLIC PANEL WITH GFR
Anion gap: 16 — ABNORMAL HIGH (ref 5–15)
BUN: 82 mg/dL — ABNORMAL HIGH (ref 8–23)
CO2: 20 mmol/L — ABNORMAL LOW (ref 22–32)
Calcium: 8.6 mg/dL — ABNORMAL LOW (ref 8.9–10.3)
Chloride: 92 mmol/L — ABNORMAL LOW (ref 98–111)
Creatinine, Ser: 7.18 mg/dL — ABNORMAL HIGH (ref 0.44–1.00)
GFR, Estimated: 6 mL/min — ABNORMAL LOW (ref >60)
Glucose, Bld: 189 mg/dL — ABNORMAL HIGH (ref 70–99)
Potassium: 4.2 mmol/L (ref 3.5–5.1)
Sodium: 128 mmol/L — ABNORMAL LOW (ref 135–145)

## 2023-12-22 LAB — MAGNESIUM: Magnesium: 2.1 mg/dL (ref 1.7–2.4)

## 2023-12-22 LAB — CULTURE, BLOOD (ROUTINE X 2)
Culture: NO GROWTH
Culture: NO GROWTH
Special Requests: ADEQUATE

## 2023-12-22 LAB — PHOSPHORUS: Phosphorus: 5.3 mg/dL — ABNORMAL HIGH (ref 2.5–4.6)

## 2023-12-22 MED ORDER — SODIUM CHLORIDE 0.9 % IV BOLUS
500.0000 mL | Freq: Once | INTRAVENOUS | Status: AC | PRN
  Administered 2023-12-22: 500 mL via INTRAVENOUS

## 2023-12-22 MED ORDER — SODIUM CHLORIDE 0.9 % IV BOLUS
500.0000 mL | Freq: Once | INTRAVENOUS | Status: AC
  Administered 2023-12-22: 500 mL via INTRAVENOUS

## 2023-12-22 NOTE — Progress Notes (Signed)
 This nurse called the PCCM answering services and left information with pager to provider with regards to initial events and current patient condition.

## 2023-12-22 NOTE — Progress Notes (Signed)
   12/22/23 1650  Vitals  Temp 98.4 F (36.9 C)  Pulse Rate 75  Resp 16  BP 115/62  SpO2 92 %  O2 Device Room Air  Weight 79.5 kg  Type of Weight Post-Dialysis  Post Treatment  Dialyzer Clearance Lightly streaked  Hemodialysis Intake (mL) 0 mL  Liters Processed 78  Fluid Removed (mL) 2000 mL  Tolerated HD Treatment Yes   Received patient in bed to unit.  Alert and oriented.  Informed consent signed and in chart.   TX duration:3.25HRS  Patient tolerated well.  Transported back to the room  Alert, without acute distress.  Hand-off given to patient's nurse.   Access used: [facility] Access issues: NONE  Total UF removed: 2L Medication(s) given: NONE    Susan [name] [facility]

## 2023-12-22 NOTE — Progress Notes (Signed)
 Progress Note   Patient: Susan [name] FMW:[id number] DOB: 11/16/1950 DOA: 12/17/2023  DOS: the patient was seen and examined on 12/22/2023   Brief hospital course:  73 yo F PMH ESRD on HD, DM2 on insulin , CAD, HFrEF, pAF, decub ulcers POA, hx enterococcus bacteremia who presented to [facility] ED 12/17/23 for wound check due to increased drainage from sacral decub at home.   Assessment and Plan:  Septic shock - Initially requiring vasopressor medications made to MAP greater than 65.  Subsequently been weaned off.  Blood pressure still soft however patient does not soft BP at baseline, systolic blood pressure goal 90s.  On midodrine  3 times daily.  Shock appears resolved.  Per nephrology recommendations would avoid excessive fluid administration.  Will hold off on IV fluid bolus at this time.  Urinary tract infection - Urine culture showing Proteus.  Ceftriaxone  on board.  Showing improvement.  Possible cellulitis from sacral/LLE wound (POA) - Evaluated by Ortho recommending wound care and outpatient follow-up.  Antibiotics on board.  End-stage renal disease - Receiving dialysis TTS.  Blood pressure precarious.  Acute HFrEF - Therapy limited by hypotension.  Volume status per nephrology.  Paroxysmal atrial fibrillation - Amiodarone  on board.  Continue anticoagulation.  Diabetes mellitus - Insulin  glargine on board with insulin  sliding scale.  Monitor glucose closely.  Increased lethargy - Possibly medication side effect of Lyrica  combined with low intravascular volume and end-stage renal disease.  Will reduce Lyrica  dose.  Monitor response  Subjective: Patient alert but lethargic this morning.  States that she has chronically low blood pressure.  Scheduled for dialysis later this morning.  Denies any fever, chills, shortness of breath, chest pain, nausea, vomiting, abdominal pain.  Overall just weak.  Physical Exam:  Vitals:   12/22/23 0500 12/22/23 0824 12/22/23 1000 12/22/23 1024   BP:  (!) 87/66 (!) 93/50 (!) 93/50  Pulse:  60  61  Resp:  18  17  Temp:  97.6 F (36.4 C)  (!) 97.5 F (36.4 C)  TempSrc:      SpO2:  96%  98%  Weight: 82.8 kg     Height:        GENERAL:  Alert, mildly lethargic, pleasant, no acute distress  HEENT:  EOMI CARDIOVASCULAR:  RRR, no murmurs appreciated RESPIRATORY: Poor air movement bilaterally GASTROINTESTINAL:  Soft, nontender, nondistended EXTREMITIES: Right BKA NEURO:  No new focal deficits appreciated SKIN: Mild LLE pitting edema  PSYCH:  Appropriate mood and affect     Data Reviewed:  Imaging Studies: CT ABDOMEN PELVIS WO CONTRAST Result Date: 12/17/2023 CLINICAL DATA:  Decubitus wounds. EXAM: CT ABDOMEN AND PELVIS WITHOUT CONTRAST TECHNIQUE: Multidetector CT imaging of the abdomen and pelvis was performed following the standard protocol without IV contrast. RADIATION DOSE REDUCTION: This exam was performed according to the departmental dose-optimization program which includes automated exposure control, adjustment of the mA and/or kV according to patient size and/or use of iterative reconstruction technique. COMPARISON:  Oct 01, 2023. FINDINGS: Lower chest: No acute abnormality. Hepatobiliary: Cholelithiasis. No biliary dilatation. Liver is unremarkable. Pancreas: Unremarkable. No pancreatic ductal dilatation or surrounding inflammatory changes. Spleen: Multiple small calcified splenic granulomas are noted. Adrenals/Urinary Tract: Adrenal glands appear normal. Bilateral renal atrophy is noted consistent with end-stage renal disease. No hydronephrosis or renal obstruction is noted. Urinary bladder is unremarkable. Stomach/Bowel: Stomach is within normal limits. Appendix appears normal. No evidence of bowel wall thickening, distention, or inflammatory changes. Moderate amount of stool is seen in the sigmoid colon and  rectum. Vascular/Lymphatic: Aortic atherosclerosis. No enlarged abdominal or pelvic lymph nodes. Reproductive: Status  post hysterectomy. No adnexal masses. Other: No ascites or hernia is noted. Large sacral decubitus ulceration or wound is noted, but does not appear to extend to underlying bone. Musculoskeletal: No acute or significant osseous findings. IMPRESSION: Interval development of large sacral decubitus ulceration or wound is noted described above. Cholelithiasis. Bilateral renal atrophy consistent with history of end-stage renal disease. Moderate amount of stool is seen in sigmoid colon and rectum concerning for possible impaction. Aortic Atherosclerosis (ICD10-I70.0). Electronically Signed   By: Lynwood Landy Raddle M.D.   On: 12/17/2023 11:48   DG Chest Port 1 View Result Date: 12/17/2023 CLINICAL DATA:  . questionable sepsis.  Left large ache.  Confusion EXAM: PORTABLE CHEST 1 VIEW COMPARISON:  Chest x-ray 10/01/2023 FINDINGS: Underinflation. Stable cardiopericardial silhouette with calcified aorta. Bronchovascular crowding. No consolidation, pneumothorax or effusion. Double-lumen right IJ catheter with tip overlying the SVC right atrial junction region. Overlapping cardiac leads. Degenerative changes along the spine. IMPRESSION: Underinflation. Bronchovascular crowding. Double-lumen right IJ catheter. No consolidation. Electronically Signed   By: Ranell Bring M.D.   On: 12/17/2023 11:42    There are no new results to review at this time.  Previous records (including but not limited to H&P, progress notes, nursing notes, TOC management) were reviewed in assessment of this patient.  Labs: CBC: Recent Labs  Lab 12/17/23 1145 12/18/23 0356 12/19/23 0544 12/20/23 0308 12/21/23 0520 12/22/23 0106  WBC 23.6* 23.1* 15.8* 16.0* 13.9* 10.8*  NEUTROABS 21.7*  --   --   --  11.7*  --   HGB 9.9* 10.1* 8.6* 8.8* 9.2* 9.4*  HCT 32.0* 33.2* 27.7* 28.3* 29.8* 30.1*  MCV 87.2 86.9 85.0 84.7 83.9 84.6  PLT 288 345 317 347 338 324   Basic Metabolic Panel: Recent Labs  Lab 12/17/23 1145 12/18/23 0356  12/19/23 0544 12/20/23 0308 12/21/23 0520 12/22/23 0106  NA 132* 132* 131* 130* 130* 128*  K 3.8 4.3 4.0 4.6 4.0 4.2  CL 90* 90* 91* 94* 92* 92*  CO2 24 20* 20* 16* 20* 20*  GLUCOSE 238* 235* 146* 189* 193* 189*  BUN 72* 73* 86* 102* 66* 82*  CREATININE 6.28* 6.53* 7.85* 8.58* 6.60* 7.18*  CALCIUM  9.0 8.9 9.0 8.9 8.8* 8.6*  MG 2.4  --   --   --   --  2.1  PHOS  --   --  6.0*  --   --  5.3*   Liver Function Tests: Recent Labs  Lab 12/17/23 1145 12/19/23 0544  AST 28  --   ALT 22  --   ALKPHOS 238*  --   BILITOT 1.1  --   PROT 6.5  --   ALBUMIN  2.3* 2.5*   CBG: Recent Labs  Lab 12/21/23 2033 12/22/23 0022 12/22/23 0405 12/22/23 0823 12/22/23 1207  GLUCAP 291* 174* 170* 149* 153*    Scheduled Meds:  amiodarone   200 mg Oral Daily   apixaban   5 mg Oral BID   calcitRIOL   2.25 mcg Oral Q T,Th,Sa-HD   Chlorhexidine  Gluconate Cloth  6 each Topical Daily   Chlorhexidine  Gluconate Cloth  6 each Topical Q0600   clonazepam   0.5 mg Oral BID   collagenase    Topical Daily   darbepoetin (ARANESP ) injection - DIALYSIS  100 mcg Subcutaneous Q Tue-1800   insulin  aspart  0-15 Units Subcutaneous Q4H   insulin  glargine-yfgn  10 Units Subcutaneous Daily   levothyroxine   50 mcg  Oral Q0600   liver oil-zinc  oxide   Topical BID   midodrine   10 mg Oral TID WC   sevelamer  carbonate  800 mg Oral TID WC   Continuous Infusions:  cefTRIAXone  (ROCEPHIN )  IV 2 g (12/22/23 0903)   PRN Meds:.alteplase , docusate sodium , heparin , methocarbamol , mouth rinse, oxyCODONE -acetaminophen , polyethylene glycol  Family Communication: None at bedside  Disposition: Status is: Inpatient Remains inpatient appropriate because: Septic shock, ESRD     Time spent: 49 minutes  Length of inpatient stay: 5 days  Author: Carliss LELON Canales, DO 12/22/2023 12:53 PM  For on call review [email or web address].

## 2023-12-22 NOTE — Plan of Care (Signed)

## 2023-12-22 NOTE — Progress Notes (Signed)
 [facility] Progress Note   Subjective:   Patient more lethargic and altered today.  Blood pressure is low but overall MAP is about the same as it has been for the past few days so this is unchanged.  Yesterday she was having some tremor of her right upper extremity.  Today she seems to be having difficulty staying awake, finishing sentences, using her utensils to eat breakfast.  I notice some flapping of both hands but she has a hard time complying with the exam for asterixis.  Objective Vitals:   12/22/23 0300 12/22/23 0404 12/22/23 0500 12/22/23 0824  BP: (!) 87/50 (!) 92/58  (!) 87/66  Pulse: 60 67  60  Resp: 20 17  18   Temp: 98.4 F (36.9 C) 98.4 F (36.9 C)  97.6 F (36.4 C)  TempSrc:      SpO2: 95% 99%  96%  Weight:   82.8 kg   Height:       General: Chronically ill-appearing, no distress, lethargic HENT: NCAT, EOMI Lungs: Bilateral chest rise with no increased work of breathing Cardiovascular: Normal rate with no rub Abdomen: soft, nondistended Extremities: LLE wound with drainage, R BKA  Neuro: Tired, lethargic, tremor of the right hand possibly myoclonic, undetermined asterixis GU: no foley  Dialysis access: R TDC in place; multiple failed AVF  Additional Objective Labs: Basic Metabolic Panel: Recent Labs  Lab 12/19/23 0544 12/20/23 0308 12/21/23 0520 12/22/23 0106  NA 131* 130* 130* 128*  K 4.0 4.6 4.0 4.2  CL 91* 94* 92* 92*  CO2 20* 16* 20* 20*  GLUCOSE 146* 189* 193* 189*  BUN 86* 102* 66* 82*  CREATININE 7.85* 8.58* 6.60* 7.18*  CALCIUM  9.0 8.9 8.8* 8.6*  PHOS 6.0*  --   --  5.3*   Liver Function Tests: Recent Labs  Lab 12/17/23 1145 12/19/23 0544  AST 28  --   ALT 22  --   ALKPHOS 238*  --   BILITOT 1.1  --   PROT 6.5  --   ALBUMIN  2.3* 2.5*   No results for input(s): LIPASE, AMYLASE in the last 168 hours. CBC: Recent Labs  Lab 12/17/23 1145 12/18/23 0356 12/19/23 0544 12/20/23 0308 12/21/23 0520 12/22/23 0106   WBC 23.6* 23.1* 15.8* 16.0* 13.9* 10.8*  NEUTROABS 21.7*  --   --   --  11.7*  --   HGB 9.9* 10.1* 8.6* 8.8* 9.2* 9.4*  HCT 32.0* 33.2* 27.7* 28.3* 29.8* 30.1*  MCV 87.2 86.9 85.0 84.7 83.9 84.6  PLT 288 345 317 347 338 324   Blood Culture    Component Value Date/Time   SDES  12/17/2023 1332    URINE, CLEAN CATCH Performed at [facility], [address], [place], [place] [address]    SPECREQUEST  12/17/2023 1332    NONE Reflexed from [id number] Performed at [facility], [address], [place], [place] [address]    CULT >=100,000 COLONIES/mL PROTEUS MIRABILIS (A) 12/17/2023 1332   REPTSTATUS 12/19/2023 FINAL 12/17/2023 1332     CBG: Recent Labs  Lab 12/21/23 1634 12/21/23 2033 12/22/23 0022 12/22/23 0405 12/22/23 0823  GLUCAP 274* 291* 174* 170* 149*    Medications:  cefTRIAXone  (ROCEPHIN )  IV 2 g (12/22/23 0903)    amiodarone   200 mg Oral Daily   apixaban   5 mg Oral BID   calcitRIOL   2.25 mcg Oral Q T,Th,Sa-HD   Chlorhexidine  Gluconate Cloth  6 each Topical Daily   Chlorhexidine  Gluconate Cloth  6 each  Topical Q0600   clonazepam   0.5 mg Oral BID   collagenase    Topical Daily   darbepoetin (ARANESP ) injection - DIALYSIS  100 mcg Subcutaneous Q Tue-1800   insulin  aspart  0-15 Units Subcutaneous Q4H   insulin  glargine-yfgn  10 Units Subcutaneous Daily   levothyroxine   50 mcg Oral Q0600   liver oil-zinc  oxide   Topical BID   midodrine   10 mg Oral TID WC   sevelamer  carbonate  800 mg Oral TID WC    Dialysis Orders:  [facility] TTS 4 hr - last HD 12/15/23 400 QB/ Manual 800 DFR 2K/2 Ca bath Heparin  4000 U bolus EDW 77.4 Mircera 100 mcg last given 12/01/23 Venofer  100 mg q treatment; last given 12/15/23 Calcitriol  2.5 mcg every treatment Sensipar 30 mg q treatment   Assessment/Plan: Septic shock: Presumed from UTI vs cellulitis. Pt with hx of enterococcus bacteremia.  Continue antibiotics per primary team.  No longer on pressors.  On midodrine .  Blood pressure  stable around her baseline.  Would avoid excessive fluid administration  ESRD:  TTS schedule at [facility].  Continue dialysis per schedule as able  Hypertension/volume: Continue midodrine .  Volume removal on dialysis as tolerated  Anemia: On mircera at OP HD. Will dose today  Metabolic bone disease: On calcitriol  2.5 mcg at treatment and sensipar 30 mg at treatment. Started sevelamer .  PAF: anticoag per primary DM2: continue SSI per primary Altered mental status: Also with tremors/myoclonic movements of the hands.  She even had this yesterday when BUN was at its lowest.  I am not confident this represents pure uremia. I will stop lyrica  and obtain ammonia level. HD today as tolerated. Consider neurology   12/22/2023, 10:21 AM  [facility]

## 2023-12-22 NOTE — Progress Notes (Addendum)
 Bedside for teley alert of  Vtach and CNA called out that patient was unresponsive when attempting to wake the patient. Sternal rub did cause patient to respond. Pt very lethargic and drowsy very hypotensive BP, RR nurse called with concerns. Pt has [facility] listed in providers section however reach out to on call [facility] hospitalist who states they have not seen the patient and would not be seeing the patient until am. RR and house supervisor both notified on primary RN concerns and concerns for patient hypotension and AMS. Page placed to CCM even with previous note stated patient would be transitioned to hospitalist Per Susan Everhart,DO Hemodynamics are stabilizing- Will transfer to hospitalist service at 1036AM 12/21/23.  Pt is HD patient and BP and Map have remained low since arrival to the unit from ICU. Vitals as follows: 7/28 2056: 98/47 MAP 61  7/29 0013 80/41 MAP 53  Fluid bolus started per RR nurse guidance.  0026: with fluids running 89/61 MAP 70 0044 99/52 MAP 65 0218 76/36 MAP 48 0223 84/38 MAP 51 0404 multiple attempts 92/58 MAP 65.   PRIMARY RN still concerned for patient with hypotension and AMS from baseline assessment at the start of the shift.

## 2023-12-22 NOTE — Progress Notes (Signed)
 Physical Therapy Treatment Patient Details Name: Susan Susan [name] MRN: [id number] DOB: 12/30/1950 Today's Date: 12/22/2023   History of Present Illness 73 yo F who presented to [facility] ED 12/17/23 for wound check. Reportedly incr drainage from sacral decub at home. Recent UTI. Transferred to [facility] for Septic shock,  Proteus UTI.  PMH ESRD on HD, DM2 on insulin , CAD, HFrEF, pAF, decub ulcers POA, hx enterococcus bacteremia    PT Comments  Patient able to attempt sit to stand today, though still not able to stand upright despite +2 total A.  Spouse had brought in her prosthesis so donned while at [facility] though pt continued to get weaker in sitting during donning and during CBG assessment with NT.  Initial BP sitting stable compared to supine though MAP 54 (RN aware).  She will continue to benefit from skilled PT in the acute setting.  If home will need hoyer Susan [name] initially.     If plan is discharge home, recommend the following: Two people to help with walking and/or transfers;Two people to help with bathing/dressing/bathroom;Assistance with cooking/housework;Assist for transportation;Help with stairs or ramp for entrance   Can travel by private vehicle        Equipment Recommendations  Susan Susan [name]    Recommendations for Other Services       Precautions / Restrictions Precautions Precautions: Fall Recall of Precautions/Restrictions: Impaired Precaution/Restrictions Comments: Sacral Wound, wound to Lt calf. Hx Rt BKA. Chronic Rt RCT weakness; watch BP     Mobility  Bed Mobility Overal bed mobility: Needs Assistance Bed Mobility: Rolling, Sidelying to Sit, Sit to Sidelying Rolling: +2 for physical assistance, Mod assist Sidelying to sit: +2 for physical assistance, Mod assist     Sit to sidelying: Max assist, +2 for physical assistance General bed mobility comments: cues for technique, assist for legs, trunk to sit and for all aspects to supine    Transfers Overall transfer level: Needs  assistance Equipment used: Rolling walker (2 wheels) (R LE prosthesis) Transfers: Sit to/from Stand Sit to Stand: +2 physical assistance, Total assist, From elevated surface           General transfer comment: spouse present and helped with bringing in prosthesis; elevated surface, feet placed under her, cues and A for sitting balance during attempt to stand, lifting help with bed pad under pt and pt unable to Susan [name] hips or extend knees    Ambulation/Gait                   Stairs             Wheelchair Mobility     Tilt Bed    Modified Rankin (Stroke Patients Only)       Balance Overall balance assessment: Needs assistance   Sitting balance-Leahy Scale: Poor Sitting balance - Comments: brief moments of sitting with CGA mostly min to mod A extended time due to NT to check CBG   Standing balance support: Bilateral upper extremity supported Standing balance-Leahy Scale: Zero                              Communication Communication Communication: No apparent difficulties  Cognition Arousal: Alert Behavior During Therapy: WFL for tasks assessed/performed   PT - Cognitive impairments: Attention, Sequencing, Problem solving                       PT - Cognition Comments: awake, though slow processing, and  repeated cues needed   Following commands impaired: Only follows one step commands consistently, Follows one step commands with increased time    Cueing Cueing Techniques: Verbal cues  Exercises      General Comments General comments (skin integrity, edema, etc.): assisted once supine for hygiene, RN changed mepilex pad, positioned with pillow for offloading (RN to check why she has not gotten air bed)      Pertinent Vitals/Pain Pain Assessment Pain Assessment: Faces Faces Pain Scale: Hurts whole lot Pain Location: bottom with movement Pain Descriptors / Indicators: Discomfort, Grimacing Pain Intervention(s): Monitored during  session, Repositioned    Home Living                          Prior Function            PT Goals (current goals can now be found in the care plan section) Progress towards PT goals: Progressing toward goals    Frequency    Min 2X/week      PT Plan      Co-evaluation              AM-PAC PT 6 Clicks Mobility   Outcome Measure  Help needed turning from your back to your side while in a flat bed without using bedrails?: A Lot Help needed moving from lying on your back to sitting on the side of a flat bed without using bedrails?: Total Help needed moving to and from a bed to a chair (including a wheelchair)?: Total Help needed standing up from a chair using your arms (e.g., wheelchair or bedside chair)?: Total Help needed to walk in hospital room?: Total Help needed climbing 3-5 steps with a railing? : Total 6 Click Score: 7    End of Session Equipment Utilized During Treatment: Gait belt;Other (comment) (R LE prosthesis) Activity Tolerance: Patient limited by pain Patient left: in bed;with call bell/phone within reach;with bed alarm set;with family/visitor present   PT Visit Diagnosis: Other abnormalities of gait and mobility (R26.89);Muscle weakness (generalized) (M62.81);Pain Pain - part of body:  (buttocks)     Time: 8844-8776 PT Time Calculation (min) (ACUTE ONLY): 28 min  Charges:    $Therapeutic Activity: 23-37 mins PT General Charges $$ ACUTE PT VISIT: 1 Visit                     Micheline Portal, PT Acute Rehabilitation Services Office:[phone] 12/22/2023    Montie Portal 12/22/2023, 2:02 PM

## 2023-12-22 NOTE — Hospital Course (Signed)
 73 yo F PMH ESRD on HD, DM2 on insulin , CAD, HFrEF, pAF, decub ulcers POA, hx enterococcus bacteremia who presented to [facility] ED 12/17/23 for wound check due to increased drainage from sacral decub at home.    Assessment and Plan:   Septic shock - Initially requiring vasopressor medications made to MAP greater than 65.  Subsequently been weaned off.  Blood pressure still soft however patient does not soft BP at baseline, systolic blood pressure goal 90s.  On midodrine  3 times daily.  Shock appears resolved.  Per nephrology recommendations would avoid excessive fluid administration.  Will hold off on IV fluid bolus at this time.   Urinary tract infection - Urine culture showing Proteus.  Ceftriaxone  on board.  Showing improvement.   Possible cellulitis from sacral/LLE wound (POA) - Evaluated by Ortho recommending wound care and outpatient follow-up.  Antibiotics on board.   End-stage renal disease - Receiving dialysis TTS.  Blood pressure precarious.   Acute HFrEF - Therapy limited by hypotension.  Volume status per nephrology.   Paroxysmal atrial fibrillation - Amiodarone  on board.  Continue anticoagulation.   Diabetes mellitus - Insulin  glargine on board with insulin  sliding scale.  Monitor glucose closely.   Increased lethargy - Possibly medication side effect of Lyrica  combined with low intravascular volume and end-stage renal disease.  Will reduce Lyrica  dose.  Monitor response

## 2023-12-22 NOTE — Progress Notes (Signed)
 Patient re-assessed, she is still lerthargic, on MEWS red. Her blood pressures are still low.

## 2023-12-22 NOTE — Progress Notes (Signed)
 eLink Physician-Brief Progress Note Patient Name: Susan [name] DOB: 04-25-1951 MRN: [id number]   Date of Service  12/22/2023  HPI/Events of Note  Patient presented with septic shock secondary to Proteus UTI admitted with cellulitis in the setting of ESRD and heart failure.  Patient was transferred out of the ICU placement; for blood pressure with MAP of 50   eICU Interventions  Blood pressures are reviewed, currently on midodrine  and 1 MAP of 50s seems mostly like an aberrancy.  The patient is within their normal range.  No further intervention needed  Anticipating HD today, A.m. labs consistent with stable azotemia, appropriate electrolytes      Intervention Category Intermediate Interventions: Hypotension - evaluation and management  Kamerin Grumbine 12/22/2023, 4:09 AM

## 2023-12-22 NOTE — Progress Notes (Signed)
 Follow up made with charge nurse with regard to the patient, issues escalated to house supervisor.

## 2023-12-22 NOTE — Progress Notes (Signed)
 Patient who was noted to have change in mentation and was less responsive as compared to the initial assessment. Patient was noted to be hypotensive with blood pressures of 89/61. Rapid response was notified. Patient placed on continuous monitoring, awaiting further orders since patient is an end-stage renal patient on hemodialysis.

## 2023-12-22 NOTE — Progress Notes (Signed)
 This nurse reached out to the [facility] provider Opyd due to above concerns, who indicated that patient is under the care of PCCM for now and not under their care. Issue was escalated to charge nurse over the same. Patient given 500 mils of bolus normal saline.

## 2023-12-22 NOTE — Progress Notes (Signed)
 MEWS Progress Note  Patient Details Name: Susan [name] MRN: [id number] DOB: 1950-12-10 Today's Date: 12/22/2023   MEWS Flowsheet Documentation:  Assess: MEWS Score Temp: 98.4 F (36.9 C) BP: (!) 92/58 MAP (mmHg): 65 Pulse Rate: 67 ECG Heart Rate: (!) 56 Resp: 17 Level of Consciousness: Responds to Voice SpO2: 99 % O2 Device: Room Air Patient Activity (if Appropriate): In bed O2 Flow Rate (L/min): 2 L/min Assess: MEWS Score MEWS Temp: 0 MEWS Systolic: 1 MEWS Pulse: 0 MEWS RR: 0 MEWS LOC: 1 MEWS Score: 2 MEWS Score Color: Yellow Assess: SIRS CRITERIA SIRS Temperature : 0 SIRS Respirations : 0 SIRS Pulse: 0 SIRS WBC: 0 SIRS Score Sum : 0 SIRS Temperature : 0 SIRS Pulse: 0 SIRS Respirations : 0 SIRS WBC: 0 SIRS Score Sum : 0 Assess: if the MEWS score is Yellow or Red Were vital signs accurate and taken at a resting state?: Yes Does the patient meet 2 or more of the SIRS criteria?: Yes Does the patient have a confirmed or suspected source of infection?: Yes MEWS guidelines implemented : Yes, red Treat MEWS Interventions: Considered administering scheduled or prn medications/treatments as ordered Take Vital Signs Increase Vital Sign Frequency : Red: Q1hr x2, continue Q4hrs until patient remains green for 12hrs Escalate MEWS: Escalate: Red: Discuss with charge nurse and notify provider. Consider notifying RRT. If remains red for 2 hours consider need for higher level of care        Susan [name] 12/22/2023, 5:53 AM

## 2023-12-22 NOTE — Significant Event (Addendum)
 Rapid Response Event Note   Reason for Call :  Hypotensive SBP 80s, AMS  Initial Focused Assessment:  Patient in trendelenburg, with bolus infusing, awakens to voice, oriented to self and knows she is in [place] but can not tell me where. Patient drowsy, klonopin  given at 22:15. Bedside RN stated CCMD called with reports of v-tach on the monitor earlier, reviewed, 5 beats of vtach. Lungs clear after bolus finished.   Temp 99 BP 99/52 HR 60 RR 20 O2 98 room air  03:19 RN concerns for hypotension again. MD was notified by bedside RN. No further orders. Rounded on Patient at 0430. Patient has her eyes closed but talking to herself. Cardiac monitoring reviewed and no new episodes of arrhythmias or ectopy.   Interventions:  Vital signs CBG 174 bolus EKG Labs (cbc, bmp, mag, phos)  Plan of Care:  Check lab work, continue to monitor cardiac rhythm. SBP goal 90 per MD note.   Event Summary:   MD Notified: [id number] Call Time: 0020 Arrival Time: 0024 End Time: 0100  Delon Daub, RN

## 2023-12-23 DIAGNOSIS — A419 Sepsis, unspecified organism: Secondary | ICD-10-CM | POA: Diagnosis not present

## 2023-12-23 DIAGNOSIS — G9341 Metabolic encephalopathy: Secondary | ICD-10-CM

## 2023-12-23 DIAGNOSIS — I5023 Acute on chronic systolic (congestive) heart failure: Secondary | ICD-10-CM | POA: Diagnosis not present

## 2023-12-23 DIAGNOSIS — D638 Anemia in other chronic diseases classified elsewhere: Secondary | ICD-10-CM | POA: Diagnosis not present

## 2023-12-23 DIAGNOSIS — I480 Paroxysmal atrial fibrillation: Secondary | ICD-10-CM | POA: Diagnosis not present

## 2023-12-23 LAB — CBC
HCT: 29.7 % — ABNORMAL LOW (ref 36.0–46.0)
Hemoglobin: 9.1 g/dL — ABNORMAL LOW (ref 12.0–15.0)
MCH: 26.3 pg (ref 26.0–34.0)
MCHC: 30.6 g/dL (ref 30.0–36.0)
MCV: 85.8 fL (ref 80.0–100.0)
Platelets: 310 K/uL (ref 150–400)
RBC: 3.46 MIL/uL — ABNORMAL LOW (ref 3.87–5.11)
RDW: 17.4 % — ABNORMAL HIGH (ref 11.5–15.5)
WBC: 10.5 K/uL (ref 4.0–10.5)
nRBC: 0 % (ref 0.0–0.2)

## 2023-12-23 LAB — BASIC METABOLIC PANEL WITH GFR
Anion gap: 16 — ABNORMAL HIGH (ref 5–15)
BUN: 45 mg/dL — ABNORMAL HIGH (ref 8–23)
CO2: 21 mmol/L — ABNORMAL LOW (ref 22–32)
Calcium: 8.6 mg/dL — ABNORMAL LOW (ref 8.9–10.3)
Chloride: 94 mmol/L — ABNORMAL LOW (ref 98–111)
Creatinine, Ser: 4.65 mg/dL — ABNORMAL HIGH (ref 0.44–1.00)
GFR, Estimated: 9 mL/min — ABNORMAL LOW (ref >60)
Glucose, Bld: 173 mg/dL — ABNORMAL HIGH (ref 70–99)
Potassium: 4.4 mmol/L (ref 3.5–5.1)
Sodium: 131 mmol/L — ABNORMAL LOW (ref 135–145)

## 2023-12-23 LAB — GLUCOSE, CAPILLARY
Glucose-Capillary: 161 mg/dL — ABNORMAL HIGH (ref 70–99)
Glucose-Capillary: 141 mg/dL — ABNORMAL HIGH (ref 70–99)
Glucose-Capillary: 189 mg/dL — ABNORMAL HIGH (ref 70–99)
Glucose-Capillary: 210 mg/dL — ABNORMAL HIGH (ref 70–99)
Glucose-Capillary: 297 mg/dL — ABNORMAL HIGH (ref 70–99)

## 2023-12-23 LAB — MAGNESIUM: Magnesium: 2 mg/dL (ref 1.7–2.4)

## 2023-12-23 LAB — PHOSPHORUS: Phosphorus: 4.3 mg/dL (ref 2.5–4.6)

## 2023-12-23 MED ORDER — INSULIN ASPART 100 UNIT/ML IJ SOLN
0.0000 [IU] | Freq: Three times a day (TID) | INTRAMUSCULAR | Status: DC
  Administered 2023-12-24: 9 [IU] via SUBCUTANEOUS
  Administered 2023-12-24: 7 [IU] via SUBCUTANEOUS

## 2023-12-23 MED ORDER — INSULIN ASPART 100 UNIT/ML IJ SOLN
0.0000 [IU] | Freq: Every day | INTRAMUSCULAR | Status: DC
  Administered 2023-12-23 – 2023-12-24 (×2): 3 [IU] via SUBCUTANEOUS

## 2023-12-23 MED ORDER — CLONAZEPAM 0.25 MG PO TBDP
0.2500 mg | ORAL_TABLET | Freq: Two times a day (BID) | ORAL | Status: DC
  Administered 2023-12-23 – 2023-12-25 (×4): 0.25 mg via ORAL
  Filled 2023-12-23 (×4): qty 1

## 2023-12-23 NOTE — Plan of Care (Signed)
  Problem: Education: Goal: Knowledge of General Education information will improve Description: Including pain rating scale, medication(s)/side effects and non-pharmacologic comfort measures Outcome: Progressing   Problem: Health Behavior/Discharge Planning: Goal: Ability to manage health-related needs will improve Outcome: Progressing   Problem: Clinical Measurements: Goal: Ability to maintain clinical measurements within normal limits will improve Outcome: Progressing Goal: Will remain free from infection Outcome: Progressing Goal: Diagnostic test results will improve Outcome: Progressing Goal: Respiratory complications will improve Outcome: Progressing Goal: Cardiovascular complication will be avoided Outcome: Progressing   Problem: Activity: Goal: Risk for activity intolerance will decrease Outcome: Progressing   Problem: Nutrition: Goal: Adequate nutrition will be maintained Outcome: Progressing   Problem: Coping: Goal: Level of anxiety will decrease Outcome: Progressing   Problem: Elimination: Goal: Will not experience complications related to bowel motility Outcome: Progressing Goal: Will not experience complications related to urinary retention Outcome: Progressing   Problem: Pain Managment: Goal: General experience of comfort will improve and/or be controlled Outcome: Progressing   Problem: Safety: Goal: Ability to remain free from injury will improve Outcome: Progressing   Problem: Skin Integrity: Goal: Risk for impaired skin integrity will decrease Outcome: Progressing   Problem: Coping: Goal: Ability to adjust to condition or change in health will improve Outcome: Progressing   Problem: Fluid Volume: Goal: Ability to maintain a balanced intake and output will improve Outcome: Progressing   Problem: Health Behavior/Discharge Planning: Goal: Ability to manage health-related needs will improve Outcome: Progressing   Problem: Metabolic: Goal:  Ability to maintain appropriate glucose levels will improve Outcome: Progressing   Problem: Nutritional: Goal: Maintenance of adequate nutrition will improve Outcome: Progressing   Problem: Skin Integrity: Goal: Risk for impaired skin integrity will decrease Outcome: Progressing   Problem: Tissue Perfusion: Goal: Adequacy of tissue perfusion will improve Outcome: Progressing

## 2023-12-23 NOTE — Progress Notes (Signed)
 Occupational Therapy Treatment Patient Details Name: Susan [name] MRN: [id number] DOB: [birth year] Today's Date: 12/23/2023   History of present illness 73 yo F who presented to [facility] ED 12/17/23 for wound check. Reportedly incr drainage from sacral decub at home. Recent UTI. Transferred to [facility] for Septic shock,  Proteus UTI.  PMH ESRD on HD, DM2 on insulin , CAD, HFrEF, pAF, decub ulcers POA, hx enterococcus bacteremia   OT comments  Patient received in supine and agreeable to OT treatment. Patient was mod assist +2 to get to EOB and was CGA to min assist for sitting balance with bouts of supervision. LLE prosthesis and right shoe donned while on EOB. Patient performed sit to stand from EOB with max assist +2 and tolerated less than one minute. Patient appeared to have had a BM and returned to EOB. Patient attempted to stand at 2nd time for cleaning and was unable to come to complete standing. Patient was assisted back to supine with max assist +2 and was provided cleaning. Self feeding address with HOB elevated with patient able to feed self with RUE with elbow supported. Patient's husband present and provided education on setup for self feeding. Discharge recommendations continue to be appropriate. Acute OT to continue to follow to address established goals to facilitate DC to next venue of care.        If plan is discharge home, recommend the following:  Help with stairs or ramp for entrance;Assist for transportation;Assistance with cooking/housework;A lot of help with walking and/or transfers;A lot of help with bathing/dressing/bathroom   Equipment Recommendations  None recommended by OT    Recommendations for Other Services      Precautions / Restrictions Precautions Precautions: Fall Recall of Precautions/Restrictions: Impaired Precaution/Restrictions Comments: Sacral Wound, wound to Lt calf. Hx Rt BKA. Chronic Rt RCT weakness; watch BP Restrictions Weight Bearing Restrictions Per  Provider Order: No       Mobility Bed Mobility Overal bed mobility: Needs Assistance Bed Mobility: Rolling, Sidelying to Sit, Sit to Sidelying Rolling: +2 for physical assistance, Mod assist Sidelying to sit: +2 for physical assistance, Mod assist     Sit to sidelying: Max assist, +2 for physical assistance General bed mobility comments: cues to assist with patient limited due to RUE limitations    Transfers Overall transfer level: Needs assistance Equipment used: Rolling walker (2 wheels) Transfers: Sit to/from Stand Sit to Stand: Mod assist, +2 physical assistance           General transfer comment: LLE donned prior to standing with patient demonstrating full stand on first attempt and unable to come to complete stand on 2nd attempt     Balance Overall balance assessment: Needs assistance Sitting-balance support: Feet supported, Single extremity supported Sitting balance-Leahy Scale: Poor Sitting balance - Comments: bouts of CGA to SBA while on EOB, min to CGA for safety   Standing balance support: Bilateral upper extremity supported Standing balance-Leahy Scale: Zero Standing balance comment: able to come to complete stand with BUE support with RW on 1st stand, unable to come to complete standing on 2nd attempt                           ADL either performed or assessed with clinical judgement   ADL Overall ADL's : Needs assistance/impaired Eating/Feeding: Minimal assistance;Bed level Eating/Feeding Details (indicate cue type and reason): able to feed self with support at RUE elbow. Patient's husband present and educated Grooming: Wash/dry hands;Wash/dry face;Supervision/safety;Bed level  Lower Body Bathing: Maximal assistance;Bed level Lower Body Bathing Details (indicate cue type and reason): assistance with cleaning following having a BM     Lower Body Dressing: Maximal assistance;Sitting/lateral leans Lower Body Dressing Details (indicate cue  type and reason): to donn right shoe and LLE prosthesis               General ADL Comments: education to husband to allow patient to attempt to feed self    Extremity/Trunk Assessment Upper Extremity Assessment RUE Deficits / Details: Chronic RCT.  Globally weak 3+/5 RUE Sensation: WNL RUE Coordination: WNL            Vision       Perception     Praxis     Communication Communication Communication: No apparent difficulties   Cognition Arousal: Alert Behavior During Therapy: WFL for tasks assessed/performed Cognition: History of cognitive impairments             OT - Cognition Comments: patient stating, why can't I remember anything                 Following commands: Impaired Following commands impaired: Only follows one step commands consistently, Follows one step commands with increased time      Cueing   Cueing Techniques: Verbal cues  Exercises      Shoulder Instructions       General Comments      Pertinent Vitals/ Pain       Pain Assessment Pain Assessment: Faces Faces Pain Scale: Hurts whole lot Pain Location: bottom with movement Pain Descriptors / Indicators: Discomfort, Grimacing Pain Intervention(s): Limited activity within patient's tolerance, Monitored during session, Repositioned  Home Living                                          Prior Functioning/Environment              Frequency  Min 2X/week        Progress Toward Goals  OT Goals(current goals can now be found in the care plan section)  Progress towards OT goals: Progressing toward goals  Acute Rehab OT Goals Patient Stated Goal: to go home OT Goal Formulation: With patient Time For Goal Achievement: 01/04/24 Potential to Achieve Goals: Fair ADL Goals Pt Will Perform Eating: with set-up;sitting Pt Will Perform Grooming: with set-up;sitting Pt Will Perform Upper Body Bathing: with set-up;sitting Pt Will Perform Upper Body  Dressing: with set-up;sitting Pt Will Transfer to Toilet: with mod assist;stand pivot transfer;bedside commode  Plan      Co-evaluation                 AM-PAC OT 6 Clicks Daily Activity     Outcome Measure   Help from another person eating meals?: A Little Help from another person taking care of personal grooming?: A Little Help from another person toileting, which includes using toliet, bedpan, or urinal?: A Lot Help from another person bathing (including washing, rinsing, drying)?: A Lot Help from another person to put on and taking off regular upper body clothing?: A Lot Help from another person to put on and taking off regular lower body clothing?: A Lot 6 Click Score: 14    End of Session Equipment Utilized During Treatment: Gait belt;Rolling walker (2 wheels)  OT Visit Diagnosis: Unsteadiness on feet (R26.81);Muscle weakness (generalized) (M62.81);History of falling (Z91.81)   Activity Tolerance Patient  tolerated treatment well   Patient Left in bed;with family/visitor present   Nurse Communication Mobility status        Time: 8957-8886 OT Time Calculation (min): 31 min  Charges: OT General Charges $OT Visit: 1 Visit OT Treatments $Self Care/Home Management : 8-22 mins $Therapeutic Activity: 8-22 mins  [name], OTA Acute Rehabilitation Services  Office [phone]   [name] 12/23/2023, 1:32 PM

## 2023-12-23 NOTE — Progress Notes (Signed)
 [facility] Progress Note   Subjective:   Patient tolerated dialysis yesterday.  Remains very altered.  Difficulty answering questions  Objective Vitals:   12/23/23 0332 12/23/23 0500 12/23/23 0831 12/23/23 0835  BP: (!) 95/40  (!) 86/46 (!) 86/25  Pulse: 62  (!) 56 (!) 57  Resp: 20  17   Temp: 98.7 F (37.1 C)  97.6 F (36.4 C)   TempSrc:      SpO2: 93%  (!) 89% 99%  Weight:  79.5 kg    Height:       General: Chronically ill-appearing, no distress, lethargic HENT: NCAT, EOMI Lungs: Bilateral chest rise with no increased work of breathing Cardiovascular: Normal rate with no rub Abdomen: soft, nondistended Extremities: LLE wound with drainage, R BKA  Neuro: Lethargic, no resting tremor GU: no foley  Dialysis access: R TDC in place; multiple failed AVF  Additional Objective Labs: Basic Metabolic Panel: Recent Labs  Lab 12/19/23 0544 12/20/23 0308 12/21/23 0520 12/22/23 0106 12/23/23 0308  NA 131*   < > 130* 128* 131*  K 4.0   < > 4.0 4.2 4.4  CL 91*   < > 92* 92* 94*  CO2 20*   < > 20* 20* 21*  GLUCOSE 146*   < > 193* 189* 173*  BUN 86*   < > 66* 82* 45*  CREATININE 7.85*   < > 6.60* 7.18* 4.65*  CALCIUM  9.0   < > 8.8* 8.6* 8.6*  PHOS 6.0*  --   --  5.3* 4.3   < > = values in this interval not displayed.   Liver Function Tests: Recent Labs  Lab 12/17/23 1145 12/19/23 0544  AST 28  --   ALT 22  --   ALKPHOS 238*  --   BILITOT 1.1  --   PROT 6.5  --   ALBUMIN  2.3* 2.5*   No results for input(s): LIPASE, AMYLASE in the last 168 hours. CBC: Recent Labs  Lab 12/17/23 1145 12/18/23 0356 12/19/23 0544 12/20/23 0308 12/21/23 0520 12/22/23 0106 12/23/23 0308  WBC 23.6*   < > 15.8* 16.0* 13.9* 10.8* 10.5  NEUTROABS 21.7*  --   --   --  11.7*  --   --   HGB 9.9*   < > 8.6* 8.8* 9.2* 9.4* 9.1*  HCT 32.0*   < > 27.7* 28.3* 29.8* 30.1* 29.7*  MCV 87.2   < > 85.0 84.7 83.9 84.6 85.8  PLT 288   < > 317 347 338 324 310   < > = values in  this interval not displayed.   Blood Culture    Component Value Date/Time   SDES  12/17/2023 1332    URINE, CLEAN CATCH Performed at [facility], [address], [place], [place] [address]    SPECREQUEST  12/17/2023 1332    NONE Reflexed from [id number] Performed at [facility], [address], [place], [place] [address]    CULT >=100,000 COLONIES/mL PROTEUS MIRABILIS (A) 12/17/2023 1332   REPTSTATUS 12/19/2023 FINAL 12/17/2023 1332     CBG: Recent Labs  Lab 12/22/23 1722 12/22/23 2022 12/22/23 2343 12/23/23 0329 12/23/23 0834  GLUCAP 117* 172* 188* 161* 141*    Medications:    amiodarone   200 mg Oral Daily   apixaban   5 mg Oral BID   calcitRIOL   2.25 mcg Oral Q T,Th,Sa-HD   Chlorhexidine  Gluconate Cloth  6 each Topical Daily   Chlorhexidine  Gluconate Cloth  6 each Topical Q0600  clonazepam   0.5 mg Oral BID   collagenase    Topical Daily   darbepoetin (ARANESP ) injection - DIALYSIS  100 mcg Subcutaneous Q Tue-1800   insulin  aspart  0-15 Units Subcutaneous Q4H   insulin  glargine-yfgn  10 Units Subcutaneous Daily   levothyroxine   50 mcg Oral Q0600   liver oil-zinc  oxide   Topical BID   midodrine   10 mg Oral TID WC   sevelamer  carbonate  800 mg Oral TID WC    Dialysis Orders:  [facility] TTS 4 hr - last HD 12/15/23 400 QB/ Manual 800 DFR 2K/2 Ca bath Heparin  4000 U bolus EDW 77.4 Mircera 100 mcg last given 12/01/23 Venofer  100 mg q treatment; last given 12/15/23 Calcitriol  2.5 mcg every treatment Sensipar 30 mg q treatment   Assessment/Plan: Septic shock: Felt to be from UTI or cellulitis.  Now off pressors.  Overall improved  ESRD:  TTS schedule at [facility].  Continue dialysis per schedule as able  Hypertension/volume: Continue midodrine .  Volume removal on dialysis as tolerated  Anemia: On mircera at OP HD.  Dosing with dialysis  Metabolic bone disease: On calcitriol  2.5 mcg at treatment and sensipar 30 mg at treatment. Started sevelamer .  PAF: anticoag per  primary DM2: continue SSI per primary Altered mental status: Also with tremors/myoclonic movements of the hands although not at rest.  Likely multifactorial.  Management per primary team   12/23/2023, 11:29 AM  [facility]

## 2023-12-23 NOTE — Progress Notes (Signed)
 Progress Note   Patient: Susan [name] FMW:[id number] DOB: 1950/07/26 DOA: 12/17/2023  DOS: the patient was seen and examined on 12/23/2023   Brief hospital course:  73 yo F PMH ESRD on HD, DM2 on insulin , CAD, HFrEF, pAF, decub ulcers POA, hx enterococcus bacteremia who presented to [facility] ED 12/17/23 for wound check due to increased drainage from sacral decub at home.    Assessment and Plan:   Septic shock - Initially requiring vasopressor medications made to MAP greater than 65.  Subsequently been weaned off.  Blood pressure still soft however patient does not soft BP at baseline, systolic blood pressure goal 90s.  On midodrine  3 times daily.  Shock appears resolved.  Per nephrology recommendations would avoid excessive fluid administration.  Will hold off on IV fluid bolus at this time.  Acute metabolic encephalopathy - Due to increased lethargy last couple days.  Possible medication side effect of medications combined with low intravascular volume end-stage renal disease.  Will attempt to limit any sedating type medications.  Reducing clonazepam , discontinue Robaxin  and as needed oxycodone .  Continue to monitor closely.   Urinary tract infection - Urine culture showing Proteus.  Ceftriaxone  on board.  Showing improvement.   Possible cellulitis from sacral/LLE wound (POA) - Evaluated by Ortho recommending wound care and outpatient follow-up.  Antibiotics on board.   End-stage renal disease - Receiving dialysis TTS.  Blood pressure precarious.   Acute HFrEF - Therapy limited by hypotension.  HD/UF able to remove 2 L yesterday 7/29.  Volume status per nephrology.   Paroxysmal atrial fibrillation - Amiodarone  on board.  Continue anticoagulation.   Diabetes mellitus - Insulin  glargine on board with insulin  sliding scale.  Monitor glucose closely.   Increased lethargy - Possibly medication side effect of Lyrica  combined with low intravascular volume and end-stage renal disease.  Will  reduce Lyrica  dose.  Monitor response   Subjective: Patient alert but still lethargic this morning.  Able to answer questions but just seems overall very slow.  Systolic blood pressures in the 90s.  Denies any fever, shortness of breath, chest pain, nausea, vomiting, abdominal pain.  Admits to feeling weak.  Was able to participate with PT yesterday.  Physical Exam:  Vitals:   12/23/23 0332 12/23/23 0500 12/23/23 0831 12/23/23 0835  BP: (!) 95/40  (!) 86/46 (!) 86/25  Pulse: 62  (!) 56 (!) 57  Resp: 20  17   Temp: 98.7 F (37.1 C)  97.6 F (36.4 C)   TempSrc:      SpO2: 93%  (!) 89% 99%  Weight:  79.5 kg    Height:        GENERAL:  Alert, mildly lethargic, pleasant, no acute distress  HEENT:  EOMI CARDIOVASCULAR:  RRR, no murmurs appreciated RESPIRATORY: Poor air movement bilaterally GASTROINTESTINAL:  Soft, nontender, nondistended EXTREMITIES: Right BKA NEURO:  No new focal deficits appreciated SKIN: Mild LLE pitting edema  PSYCH:  Appropriate mood and affect    Data Reviewed:  Imaging Studies: CT ABDOMEN PELVIS WO CONTRAST Result Date: 12/17/2023 CLINICAL DATA:  Decubitus wounds. EXAM: CT ABDOMEN AND PELVIS WITHOUT CONTRAST TECHNIQUE: Multidetector CT imaging of the abdomen and pelvis was performed following the standard protocol without IV contrast. RADIATION DOSE REDUCTION: This exam was performed according to the departmental dose-optimization program which includes automated exposure control, adjustment of the mA and/or kV according to patient size and/or use of iterative reconstruction technique. COMPARISON:  Oct 01, 2023. FINDINGS: Lower chest: No acute abnormality. Hepatobiliary: Cholelithiasis.  No biliary dilatation. Liver is unremarkable. Pancreas: Unremarkable. No pancreatic ductal dilatation or surrounding inflammatory changes. Spleen: Multiple small calcified splenic granulomas are noted. Adrenals/Urinary Tract: Adrenal glands appear normal. Bilateral renal atrophy is  noted consistent with end-stage renal disease. No hydronephrosis or renal obstruction is noted. Urinary bladder is unremarkable. Stomach/Bowel: Stomach is within normal limits. Appendix appears normal. No evidence of bowel wall thickening, distention, or inflammatory changes. Moderate amount of stool is seen in the sigmoid colon and rectum. Vascular/Lymphatic: Aortic atherosclerosis. No enlarged abdominal or pelvic lymph nodes. Reproductive: Status post hysterectomy. No adnexal masses. Other: No ascites or hernia is noted. Large sacral decubitus ulceration or wound is noted, but does not appear to extend to underlying bone. Musculoskeletal: No acute or significant osseous findings. IMPRESSION: Interval development of large sacral decubitus ulceration or wound is noted described above. Cholelithiasis. Bilateral renal atrophy consistent with history of end-stage renal disease. Moderate amount of stool is seen in sigmoid colon and rectum concerning for possible impaction. Aortic Atherosclerosis (ICD10-I70.0). Electronically Signed   By: Lynwood Landy Raddle M.D.   On: 12/17/2023 11:48   DG Chest Port 1 View Result Date: 12/17/2023 CLINICAL DATA:  . questionable sepsis.  Left large ache.  Confusion EXAM: PORTABLE CHEST 1 VIEW COMPARISON:  Chest x-ray 10/01/2023 FINDINGS: Underinflation. Stable cardiopericardial silhouette with calcified aorta. Bronchovascular crowding. No consolidation, pneumothorax or effusion. Double-lumen right IJ catheter with tip overlying the SVC right atrial junction region. Overlapping cardiac leads. Degenerative changes along the spine. IMPRESSION: Underinflation. Bronchovascular crowding. Double-lumen right IJ catheter. No consolidation. Electronically Signed   By: Ranell Bring M.D.   On: 12/17/2023 11:42    There are no new results to review at this time.  Previous records (including but not limited to H&P, progress notes, nursing notes, TOC management) were reviewed in assessment of this  patient.  Labs: CBC: Recent Labs  Lab 12/17/23 1145 12/18/23 0356 12/19/23 0544 12/20/23 0308 12/21/23 0520 12/22/23 0106 12/23/23 0308  WBC 23.6*   < > 15.8* 16.0* 13.9* 10.8* 10.5  NEUTROABS 21.7*  --   --   --  11.7*  --   --   HGB 9.9*   < > 8.6* 8.8* 9.2* 9.4* 9.1*  HCT 32.0*   < > 27.7* 28.3* 29.8* 30.1* 29.7*  MCV 87.2   < > 85.0 84.7 83.9 84.6 85.8  PLT 288   < > 317 347 338 324 310   < > = values in this interval not displayed.   Basic Metabolic Panel: Recent Labs  Lab 12/17/23 1145 12/18/23 0356 12/19/23 0544 12/20/23 0308 12/21/23 0520 12/22/23 0106 12/23/23 0308  NA 132*   < > 131* 130* 130* 128* 131*  K 3.8   < > 4.0 4.6 4.0 4.2 4.4  CL 90*   < > 91* 94* 92* 92* 94*  CO2 24   < > 20* 16* 20* 20* 21*  GLUCOSE 238*   < > 146* 189* 193* 189* 173*  BUN 72*   < > 86* 102* 66* 82* 45*  CREATININE 6.28*   < > 7.85* 8.58* 6.60* 7.18* 4.65*  CALCIUM  9.0   < > 9.0 8.9 8.8* 8.6* 8.6*  MG 2.4  --   --   --   --  2.1 2.0  PHOS  --   --  6.0*  --   --  5.3* 4.3   < > = values in this interval not displayed.   Liver Function Tests: Recent Labs  Lab 12/17/23 1145 12/19/23 0544  AST 28  --   ALT 22  --   ALKPHOS 238*  --   BILITOT 1.1  --   PROT 6.5  --   ALBUMIN  2.3* 2.5*   CBG: Recent Labs  Lab 12/22/23 1722 12/22/23 2022 12/22/23 2343 12/23/23 0329 12/23/23 0834  GLUCAP 117* 172* 188* 161* 141*    Scheduled Meds:  amiodarone   200 mg Oral Daily   apixaban   5 mg Oral BID   calcitRIOL   2.25 mcg Oral Q T,Th,Sa-HD   Chlorhexidine  Gluconate Cloth  6 each Topical Daily   Chlorhexidine  Gluconate Cloth  6 each Topical Q0600   clonazepam   0.5 mg Oral BID   collagenase    Topical Daily   darbepoetin (ARANESP ) injection - DIALYSIS  100 mcg Subcutaneous Q Tue-1800   insulin  aspart  0-15 Units Subcutaneous Q4H   insulin  glargine-yfgn  10 Units Subcutaneous Daily   levothyroxine   50 mcg Oral Q0600   liver oil-zinc  oxide   Topical BID   midodrine   10 mg  Oral TID WC   sevelamer  carbonate  800 mg Oral TID WC   Continuous Infusions: PRN Meds:.docusate sodium , methocarbamol , mouth rinse, oxyCODONE -acetaminophen , polyethylene glycol  Family Communication: None at bedside  Disposition: Status is: Inpatient Remains inpatient appropriate because: UTI, hypotension, ESRD, encephalopathy     Time spent: 40 minutes  Length of inpatient stay: 6 days  Author: Carliss LELON Canales, DO 12/23/2023 12:14 PM  For on call review [email or web address].

## 2023-12-24 DIAGNOSIS — A419 Sepsis, unspecified organism: Secondary | ICD-10-CM | POA: Diagnosis not present

## 2023-12-24 DIAGNOSIS — G9341 Metabolic encephalopathy: Secondary | ICD-10-CM | POA: Diagnosis not present

## 2023-12-24 DIAGNOSIS — N186 End stage renal disease: Secondary | ICD-10-CM | POA: Diagnosis not present

## 2023-12-24 DIAGNOSIS — N179 Acute kidney failure, unspecified: Secondary | ICD-10-CM | POA: Diagnosis not present

## 2023-12-24 DIAGNOSIS — I10 Essential (primary) hypertension: Secondary | ICD-10-CM

## 2023-12-24 DIAGNOSIS — Z992 Dependence on renal dialysis: Secondary | ICD-10-CM | POA: Diagnosis not present

## 2023-12-24 DIAGNOSIS — E1165 Type 2 diabetes mellitus with hyperglycemia: Secondary | ICD-10-CM | POA: Diagnosis not present

## 2023-12-24 LAB — BASIC METABOLIC PANEL WITH GFR
Anion gap: 15 (ref 5–15)
BUN: 62 mg/dL — ABNORMAL HIGH (ref 8–23)
CO2: 21 mmol/L — ABNORMAL LOW (ref 22–32)
Calcium: 8.9 mg/dL (ref 8.9–10.3)
Chloride: 94 mmol/L — ABNORMAL LOW (ref 98–111)
Creatinine, Ser: 6.57 mg/dL — ABNORMAL HIGH (ref 0.44–1.00)
GFR, Estimated: 6 mL/min — ABNORMAL LOW (ref >60)
Glucose, Bld: 413 mg/dL — ABNORMAL HIGH (ref 70–99)
Potassium: 4.4 mmol/L (ref 3.5–5.1)
Sodium: 130 mmol/L — ABNORMAL LOW (ref 135–145)

## 2023-12-24 LAB — GLUCOSE, CAPILLARY
Glucose-Capillary: 340 mg/dL — ABNORMAL HIGH (ref 70–99)
Glucose-Capillary: 400 mg/dL — ABNORMAL HIGH (ref 70–99)
Glucose-Capillary: 312 mg/dL — ABNORMAL HIGH (ref 70–99)
Glucose-Capillary: 283 mg/dL — ABNORMAL HIGH (ref 70–99)

## 2023-12-24 MED ORDER — INSULIN ASPART 100 UNIT/ML IJ SOLN
0.0000 [IU] | Freq: Three times a day (TID) | INTRAMUSCULAR | Status: DC
  Administered 2023-12-24: 15 [IU] via SUBCUTANEOUS
  Administered 2023-12-25: 7 [IU] via SUBCUTANEOUS

## 2023-12-24 NOTE — Progress Notes (Signed)
 Physical Therapy Treatment Patient Details Name: Susan [name] MRN: [id number] DOB: Feb 03, 1951 Today's Date: 12/24/2023   History of Present Illness 73 yo F who presented to [facility] ED 12/17/23 for wound check. Reportedly incr drainage from sacral decub at home. Recent UTI. Transferred to [facility] for Septic shock,  Proteus UTI.  PMH ESRD on HD, DM2 on insulin , CAD, HFrEF, pAF, decub ulcers POA, hx enterococcus bacteremia    PT Comments  Pt supine in bed disoriented to time and situation. Pt reports that her CBG is in the 400s but that she wants to get up. When asked pt reports that she has not been able to stand with her prosthetic for months and was only able to use if for pivot transfers to her wheelchair. Pt is total Ax2 for coming to EoB. Pt report dizziness on coming to seated but with gaze stabilization. She requires CGA-Supervision for maintaining seated EoB.While there she is able to participate in limited LE exercises, Pt is total A for return to bed. Pt left on left side to reduce pressure on sacral region. PT will continue to follow acutely.    If plan is discharge home, recommend the following: Two people to help with walking and/or transfers;Two people to help with bathing/dressing/bathroom;Assistance with cooking/housework;Assist for transportation;Help with stairs or ramp for entrance   Can travel by private vehicle      no  Equipment Recommendations  Hoyer lift       Precautions / Restrictions Precautions Precautions: Fall Recall of Precautions/Restrictions: Impaired Precaution/Restrictions Comments: Sacral Wound, wound to Lt calf. Hx Rt BKA. Chronic Rt RCT weakness; watch BP Restrictions Weight Bearing Restrictions Per Provider Order: No     Mobility  Bed Mobility Overal bed mobility: Needs Assistance Bed Mobility: Rolling, Supine to Sit, Sit to Supine Rolling: +2 for physical assistance, Mod assist   Supine to sit: Total assist, +2 for physical assistance   Sit to  sidelying: Max assist, +2 for physical assistance General bed mobility comments: total Ax2 utilized pad for helicopter transfer from supine to sit to reduce shear on her sacral wound    Transfers                   General transfer comment: deferred due to high CBG and dizziness          Balance Overall balance assessment: Needs assistance Sitting-balance support: Feet supported, Single extremity supported Sitting balance-Leahy Scale: Poor Sitting balance - Comments: bouts of CGA to SBA while on EOB, min to CGA for safety                                    Communication Communication Communication: No apparent difficulties  Cognition Arousal: Alert Behavior During Therapy: WFL for tasks assessed/performed   PT - Cognitive impairments: Attention, Sequencing, Problem solving                       PT - Cognition Comments: awake,continues to have very slowed processing and needs increased cuing Following commands: Impaired Following commands impaired: Only follows one step commands consistently, Follows one step commands with increased time    Cueing Cueing Techniques: Verbal cues  Exercises General Exercises - Lower Extremity Ankle Circles/Pumps: AROM, Left, 10 reps, Seated Long Arc Quad: Strengthening, Both, Seated, 10 reps Hip Flexion/Marching: Strengthening, Both, Seated, 10 reps    General Comments General comments (skin integrity, edema, etc.):  pt able to use gaze stabilization to improve dizziness, pt reports CBG in 400s      Pertinent Vitals/Pain Pain Assessment Pain Assessment: Faces Faces Pain Scale: Hurts whole lot Pain Location: bottom with movement Pain Descriptors / Indicators: Discomfort, Grimacing Pain Intervention(s): Limited activity within patient's tolerance, Monitored during session, Repositioned     PT Goals (current goals can now be found in the care plan section) Acute Rehab PT Goals Patient Stated Goal: get  well PT Goal Formulation: With patient Time For Goal Achievement: 01/04/24 Potential to Achieve Goals: Good Progress towards PT goals: Progressing toward goals    Frequency    Min 2X/week       AM-PAC PT 6 Clicks Mobility   Outcome Measure  Help needed turning from your back to your side while in a flat bed without using bedrails?: A Lot Help needed moving from lying on your back to sitting on the side of a flat bed without using bedrails?: Total Help needed moving to and from a bed to a chair (including a wheelchair)?: Total Help needed standing up from a chair using your arms (e.g., wheelchair or bedside chair)?: Total Help needed to walk in hospital room?: Total Help needed climbing 3-5 steps with a railing? : Total 6 Click Score: 7    End of Session   Activity Tolerance: Treatment limited secondary to medical complications (Comment) (dizziness) Patient left: in bed;with call bell/phone within reach;with bed alarm set;with family/visitor present Nurse Communication: Mobility status PT Visit Diagnosis: Other abnormalities of gait and mobility (R26.89);Muscle weakness (generalized) (M62.81);Pain Pain - part of body:  (buttocks)     Time: 8761-8742 PT Time Calculation (min) (ACUTE ONLY): 19 min  Charges:    $Therapeutic Exercise: 8-22 mins PT General Charges $$ ACUTE PT VISIT: 1 Visit                     Alixander Rallis B. Fleeta Lapidus PT, DPT Acute Rehabilitation Services Please use secure chat or  Call Office [phone] Her    Almarie KATHEE Fleeta [facility] 12/24/2023, 2:11 PM

## 2023-12-24 NOTE — Progress Notes (Signed)
    Durable Medical Equipment  (From admission, onward)           Start     Ordered   12/24/23 1428  For home use only DME Other see comment  Once       Comments: Patient is bed bound with hemodialysis need.  Patient need hoyer lift to be transferred from bed to wheelchair at the home since patient is unable to stand.  Question:  Length of Need  Answer:  12 Months   12/24/23 1429

## 2023-12-24 NOTE — TOC Progression Note (Addendum)
 Transition of Care [facility]) - Progression Note    Patient Details  Name: KARLETTA MILLAY MRN: [id number] Date of Birth: [birth year]  Transition of Care [facility]) CM/SW Contact  Rosaline JONELLE Joe, RN Phone Number: 12/24/2023, 1:53 PM  Clinical Narrative:    CM called and left a message with the patient's spouse that patient may likely discharge home tomorrow per MD.  I'm waiting to hear back from the spouse regarding update at this time.  12/24/23 1512 - CM called and spoke with the patient's husband and he plans to take the patient home with home health when stable.  Patient's spouse was agreeable to order [facility] lift for home.  I placed DME order for hoyer lift and DME note - both co-signed by the MD.  [employer] was called to deliver to the patient's home in the next 1-2 days and will reach out to the spouse for delivery.  The patient's husband plans to transport her home by wheelchair fleeta (private) tomorrow if discharged.                     Expected Discharge Plan and Services                                               Social Drivers of Health (SDOH) Interventions SDOH Screenings   Food Insecurity: No Food Insecurity (12/17/2023)  Housing: Low Risk  (12/17/2023)  Transportation Needs: No Transportation Needs (12/17/2023)  Utilities: Not At Risk (12/17/2023)  Alcohol Screen: Low Risk  (08/16/2021)  Depression (PHQ2-9): High Risk (10/26/2023)  Financial Resource Strain: Low Risk  (08/16/2021)  Physical Activity: Inactive (08/16/2021)  Social Connections: Socially Isolated (12/17/2023)  Stress: No Stress Concern Present (08/16/2021)  Tobacco Use: Medium Risk (12/17/2023)    Readmission Risk Interventions    12/21/2023    4:04 PM 10/02/2023   12:43 PM 08/06/2023    4:00 PM  Readmission Risk Prevention Plan  Transportation Screening Complete Complete Complete  Medication Review Oceanographer) Complete  Referral to Pharmacy  PCP or Specialist appointment within 3-5  days of discharge Complete Complete Complete  HRI or Home Care Consult Complete Complete Complete  SW Recovery Care/Counseling Consult Complete Complete Complete  Palliative Care Screening Not Applicable Not Applicable Not Applicable  Skilled Nursing Facility Not Applicable Not Applicable Not Applicable

## 2023-12-24 NOTE — Progress Notes (Signed)
 Progress Note   Patient: Susan [name] FMW:[id number] DOB: 1950/12/16 DOA: 12/17/2023  DOS: the patient was seen and examined on 12/24/2023   Brief hospital course:  73 yo F PMH ESRD on HD, DM2 on insulin , CAD, HFrEF, pAF, decub ulcers POA, hx enterococcus bacteremia who presented to [facility] ED 12/17/23 for wound check due to increased drainage from sacral decub at home.    Assessment and Plan:   Septic shock - Initially requiring vasopressor medications made to MAP greater than 65.  Subsequently been weaned off.  Blood pressure still soft however patient does not soft BP at baseline, systolic blood pressure goal 90s.  On midodrine  3 times daily.  Shock appears resolved.  Per nephrology recommendations would avoid excessive fluid administration.  Will hold off on IV fluid bolus at this time.   Acute metabolic encephalopathy - Had increased lethargy last couple days, seemed markedly improved this morning.  Likely medication side effect of medications combined with low intravascular volume end-stage renal disease.  Showing marked improvement after reduced clonazepam , discontinue Robaxin  and as needed oxycodone .  Continue to monitor closely.   Urinary tract infection - Urine culture showing Proteus.  Ceftriaxone  on board.  Showing improvement.   Possible cellulitis from sacral/LLE wound (POA) - Evaluated by Ortho recommending wound care and outpatient follow-up.  Antibiotics on board.   End-stage renal disease - Receiving dialysis TTS.  Blood pressure precarious.   Acute HFrEF - Therapy limited by hypotension.  HD/UF able to remove 2 L yesterday 7/29.  Volume status per nephrology.   Paroxysmal atrial fibrillation - Amiodarone  on board.  Continue anticoagulation.   Diabetes mellitus - Insulin  glargine on board with insulin  sliding scale.  Monitor glucose closely.   Physical debilitation muscle weakness - Was previously bedbound.  Husband has Nurse, adult and hospital bed at home.  Plans to  go home with home health upon discharge.  Coordinating with TOC, nephrology, husband on disposition planning.   Subjective: Patient is much more alert this morning.  Sitting up in the bed, eating breakfast.  Admits to pain in her right shoulder due to excessive ROM with bathing this morning.  Denies any fever, shortness of breath, chest pain, nausea, vomiting, abdominal pain.  Physical Exam:  Vitals:   12/23/23 2346 12/24/23 0325 12/24/23 0815 12/24/23 1153  BP: (!) 90/39 (!) 95/41 (!) 115/56 (!) 102/48  Pulse: (!) 53 (!) 55 67 62  Resp: 19 19 20 20   Temp: 99.5 F (37.5 C) 98.7 F (37.1 C) 97.7 F (36.5 C) (!) 97.5 F (36.4 C)  TempSrc:      SpO2: 94% 92% 94% 98%  Weight:  88 kg    Height:        GENERAL:  Alert, pleasant, no acute distress  HEENT:  EOMI CARDIOVASCULAR:  RRR, no murmurs appreciated RESPIRATORY: Poor air movement bilaterally GASTROINTESTINAL:  Soft, nontender, nondistended EXTREMITIES: Right BKA NEURO:  No new focal deficits appreciated SKIN: Mild LLE pitting edema  PSYCH:  Appropriate mood and affect    Data Reviewed:  Imaging Studies: CT ABDOMEN PELVIS WO CONTRAST Result Date: 12/17/2023 CLINICAL DATA:  Decubitus wounds. EXAM: CT ABDOMEN AND PELVIS WITHOUT CONTRAST TECHNIQUE: Multidetector CT imaging of the abdomen and pelvis was performed following the standard protocol without IV contrast. RADIATION DOSE REDUCTION: This exam was performed according to the departmental dose-optimization program which includes automated exposure control, adjustment of the mA and/or kV according to patient size and/or use of iterative reconstruction technique. COMPARISON:  Oct 01, 2023. FINDINGS: Lower chest: No acute abnormality. Hepatobiliary: Cholelithiasis. No biliary dilatation. Liver is unremarkable. Pancreas: Unremarkable. No pancreatic ductal dilatation or surrounding inflammatory changes. Spleen: Multiple small calcified splenic granulomas are noted. Adrenals/Urinary  Tract: Adrenal glands appear normal. Bilateral renal atrophy is noted consistent with end-stage renal disease. No hydronephrosis or renal obstruction is noted. Urinary bladder is unremarkable. Stomach/Bowel: Stomach is within normal limits. Appendix appears normal. No evidence of bowel wall thickening, distention, or inflammatory changes. Moderate amount of stool is seen in the sigmoid colon and rectum. Vascular/Lymphatic: Aortic atherosclerosis. No enlarged abdominal or pelvic lymph nodes. Reproductive: Status post hysterectomy. No adnexal masses. Other: No ascites or hernia is noted. Large sacral decubitus ulceration or wound is noted, but does not appear to extend to underlying bone. Musculoskeletal: No acute or significant osseous findings. IMPRESSION: Interval development of large sacral decubitus ulceration or wound is noted described above. Cholelithiasis. Bilateral renal atrophy consistent with history of end-stage renal disease. Moderate amount of stool is seen in sigmoid colon and rectum concerning for possible impaction. Aortic Atherosclerosis (ICD10-I70.0). Electronically Signed   By: Lynwood Landy Raddle M.D.   On: 12/17/2023 11:48   DG Chest Port 1 View Result Date: 12/17/2023 CLINICAL DATA:  . questionable sepsis.  Left large ache.  Confusion EXAM: PORTABLE CHEST 1 VIEW COMPARISON:  Chest x-ray 10/01/2023 FINDINGS: Underinflation. Stable cardiopericardial silhouette with calcified aorta. Bronchovascular crowding. No consolidation, pneumothorax or effusion. Double-lumen right IJ catheter with tip overlying the SVC right atrial junction region. Overlapping cardiac leads. Degenerative changes along the spine. IMPRESSION: Underinflation. Bronchovascular crowding. Double-lumen right IJ catheter. No consolidation. Electronically Signed   By: Ranell Bring M.D.   On: 12/17/2023 11:42    There are no new results to review at this time.  Previous records (including but not limited to H&P, progress notes,  nursing notes, TOC management) were reviewed in assessment of this patient.  Labs: CBC: Recent Labs  Lab 12/19/23 0544 12/20/23 0308 12/21/23 0520 12/22/23 0106 12/23/23 0308  WBC 15.8* 16.0* 13.9* 10.8* 10.5  NEUTROABS  --   --  11.7*  --   --   HGB 8.6* 8.8* 9.2* 9.4* 9.1*  HCT 27.7* 28.3* 29.8* 30.1* 29.7*  MCV 85.0 84.7 83.9 84.6 85.8  PLT 317 347 338 324 310   Basic Metabolic Panel: Recent Labs  Lab 12/19/23 0544 12/20/23 0308 12/21/23 0520 12/22/23 0106 12/23/23 0308  NA 131* 130* 130* 128* 131*  K 4.0 4.6 4.0 4.2 4.4  CL 91* 94* 92* 92* 94*  CO2 20* 16* 20* 20* 21*  GLUCOSE 146* 189* 193* 189* 173*  BUN 86* 102* 66* 82* 45*  CREATININE 7.85* 8.58* 6.60* 7.18* 4.65*  CALCIUM  9.0 8.9 8.8* 8.6* 8.6*  MG  --   --   --  2.1 2.0  PHOS 6.0*  --   --  5.3* 4.3   Liver Function Tests: Recent Labs  Lab 12/19/23 0544  ALBUMIN  2.5*   CBG: Recent Labs  Lab 12/23/23 1218 12/23/23 1541 12/23/23 2019 12/24/23 0810 12/24/23 1155  GLUCAP 189* 210* 297* 340* 400*    Scheduled Meds:  amiodarone   200 mg Oral Daily   apixaban   5 mg Oral BID   calcitRIOL   2.25 mcg Oral Q T,Th,Sa-HD   Chlorhexidine  Gluconate Cloth  6 each Topical Daily   Chlorhexidine  Gluconate Cloth  6 each Topical Q0600   clonazepam   0.25 mg Oral BID   collagenase    Topical Daily   darbepoetin (ARANESP ) injection -  DIALYSIS  100 mcg Subcutaneous Q Tue-1800   insulin  aspart  0-5 Units Subcutaneous QHS   insulin  aspart  0-9 Units Subcutaneous TID WC   insulin  glargine-yfgn  10 Units Subcutaneous Daily   levothyroxine   50 mcg Oral Q0600   liver oil-zinc  oxide   Topical BID   midodrine   10 mg Oral TID WC   sevelamer  carbonate  800 mg Oral TID WC   Continuous Infusions: PRN Meds:.docusate sodium , mouth rinse, polyethylene glycol  Family Communication: None at bedside  Disposition: Status is: Inpatient Remains inpatient appropriate because: ESRD     Time spent: 36 minutes  Length of  inpatient stay: 7 days  Author: Carliss LELON Canales, DO 12/24/2023 12:07 PM  For on call review [email or web address].

## 2023-12-24 NOTE — Progress Notes (Signed)
 [facility] Progress Note   Subjective:   Patient having pain on her sacrum.  Otherwise doing okay.  Much more awake and alert today  Objective Vitals:   12/23/23 2019 12/23/23 2346 12/24/23 0325 12/24/23 0815  BP: (!) 95/44 (!) 90/39 (!) 95/41 (!) 115/56  Pulse: 61 (!) 53 (!) 55 67  Resp: 19 19 19 20   Temp: 99.2 F (37.3 C) 99.5 F (37.5 C) 98.7 F (37.1 C) 97.7 F (36.5 C)  TempSrc:      SpO2: 96% 94% 92% 94%  Weight:   88 kg   Height:       General: Chronically ill-appearing, in bed in no distress HENT: NCAT, EOMI Lungs: Bilateral chest rise with no increased work of breathing Cardiovascular: Normal rate with no rub Abdomen: soft, nondistended Extremities: LLE wound with drainage, R BKA  Neuro: Alert and alert, oriented to person place and time Dialysis access: R TDC in place; multiple failed AVF  Additional Objective Labs: Basic Metabolic Panel: Recent Labs  Lab 12/19/23 0544 12/20/23 0308 12/21/23 0520 12/22/23 0106 12/23/23 0308  NA 131*   < > 130* 128* 131*  K 4.0   < > 4.0 4.2 4.4  CL 91*   < > 92* 92* 94*  CO2 20*   < > 20* 20* 21*  GLUCOSE 146*   < > 193* 189* 173*  BUN 86*   < > 66* 82* 45*  CREATININE 7.85*   < > 6.60* 7.18* 4.65*  CALCIUM  9.0   < > 8.8* 8.6* 8.6*  PHOS 6.0*  --   --  5.3* 4.3   < > = values in this interval not displayed.   Liver Function Tests: Recent Labs  Lab 12/17/23 1145 12/19/23 0544  AST 28  --   ALT 22  --   ALKPHOS 238*  --   BILITOT 1.1  --   PROT 6.5  --   ALBUMIN  2.3* 2.5*   No results for input(s): LIPASE, AMYLASE in the last 168 hours. CBC: Recent Labs  Lab 12/17/23 1145 12/18/23 0356 12/19/23 0544 12/20/23 0308 12/21/23 0520 12/22/23 0106 12/23/23 0308  WBC 23.6*   < > 15.8* 16.0* 13.9* 10.8* 10.5  NEUTROABS 21.7*  --   --   --  11.7*  --   --   HGB 9.9*   < > 8.6* 8.8* 9.2* 9.4* 9.1*  HCT 32.0*   < > 27.7* 28.3* 29.8* 30.1* 29.7*  MCV 87.2   < > 85.0 84.7 83.9 84.6 85.8   PLT 288   < > 317 347 338 324 310   < > = values in this interval not displayed.   Blood Culture    Component Value Date/Time   SDES  12/17/2023 1332    URINE, CLEAN CATCH Performed at [facility], [address], [place], [place] [address]    SPECREQUEST  12/17/2023 1332    NONE Reflexed from [id number] Performed at [facility], [address] [place], [place] [address]    CULT >=100,000 COLONIES/mL PROTEUS MIRABILIS (A) 12/17/2023 1332   REPTSTATUS 12/19/2023 FINAL 12/17/2023 1332     CBG: Recent Labs  Lab 12/23/23 0834 12/23/23 1218 12/23/23 1541 12/23/23 2019 12/24/23 0810  GLUCAP 141* 189* 210* 297* 340*    Medications:    amiodarone   200 mg Oral Daily   apixaban   5 mg Oral BID   calcitRIOL   2.25 mcg Oral Q T,Th,Sa-HD   Chlorhexidine  Gluconate Cloth  6  each Topical Daily   Chlorhexidine  Gluconate Cloth  6 each Topical Q0600   clonazepam   0.25 mg Oral BID   collagenase    Topical Daily   darbepoetin (ARANESP ) injection - DIALYSIS  100 mcg Subcutaneous Q Tue-1800   insulin  aspart  0-5 Units Subcutaneous QHS   insulin  aspart  0-9 Units Subcutaneous TID WC   insulin  glargine-yfgn  10 Units Subcutaneous Daily   levothyroxine   50 mcg Oral Q0600   liver oil-zinc  oxide   Topical BID   midodrine   10 mg Oral TID WC   sevelamer  carbonate  800 mg Oral TID WC    Dialysis Orders:  [facility] TTS 4 hr - last HD 12/15/23 400 QB/ Manual 800 DFR 2K/2 Ca bath Heparin  4000 U bolus EDW 77.4 Mircera 100 mcg last given 12/01/23 Venofer  100 mg q treatment; last given 12/15/23 Calcitriol  2.5 mcg every treatment Sensipar 30 mg q treatment   Assessment/Plan: Septic shock: Felt to be from UTI or cellulitis.  Now off pressors.  Overall improved  ESRD:  [facility]S [facility] at [facility].  Continue dialysis per schedule as able.  Due to high census may have to be done tomorrow  Hypertension/volume: Continue midodrine .  Volume removal on dialysis as tolerated  Anemia: On mircera at OP HD.  Dosing  with dialysis  Metabolic bone disease: On calcitriol  2.5 mcg at treatment and sensipar 30 mg at treatment. Started sevelamer .  PAF: anticoag per primary DM2: continue SSI per primary Altered mental status: Likely related to medication toxicity.  Now improved   12/24/2023, 9:53 AM  [employer]

## 2023-12-25 LAB — GLUCOSE, CAPILLARY
Glucose-Capillary: 298 mg/dL — ABNORMAL HIGH (ref 70–99)
Glucose-Capillary: 242 mg/dL — ABNORMAL HIGH (ref 70–99)

## 2023-12-25 MED ORDER — INSULIN GLARGINE-YFGN 100 UNIT/ML ~~LOC~~ SOLN: 20.0000 [IU] | Freq: Every day | SUBCUTANEOUS | Status: DC

## 2023-12-25 MED ORDER — INSULIN GLARGINE-YFGN 100 UNIT/ML ~~LOC~~ SOLN
10.0000 [IU] | Freq: Once | SUBCUTANEOUS | Status: DC
  Filled 2023-12-25: qty 0.1

## 2023-12-25 MED ORDER — HEPARIN SODIUM (PORCINE) 1000 UNIT/ML IJ SOLN
INTRAMUSCULAR | Status: AC
  Filled 2023-12-25: qty 4

## 2023-12-25 MED ORDER — HEPARIN SODIUM (PORCINE) 1000 UNIT/ML IJ SOLN
3800.0000 [IU] | Freq: Once | INTRAMUSCULAR | Status: AC
  Administered 2023-12-25: 3800 [IU]

## 2023-12-25 NOTE — Progress Notes (Signed)
 OT Cancellation Note  Patient Details Name: Susan [name] MRN: [id number] DOB: 1950/11/03   Cancelled Treatment:    Reason Eval/Treat Not Completed: Patient at procedure or test/ unavailable (Patient off unit in HD, OT to reattempt as schedule permits)  [name] 12/25/2023, 8:46 AM  [name], OTA Acute Rehabilitation Services  Office [phone]

## 2023-12-25 NOTE — TOC Transition Note (Addendum)
 Transition of Care [facility] - Discharge Note   Patient Details  Name: Susan [name] MRN: [id number] Date of Birth: [birth year]  [facility] CM/SW Contact:  Rosaline JONELLE Joe, RN Phone Number: 12/25/2023, 12:35 PM   Clinical Narrative:    I called and spoke with the patient's spouse by phone and he is aware that patient will likely be discharged home today after HD.  Spouse plans to transport her home by [facility] today and will be present at the bedside.  I called [employer] and requested delivery of the [facility] lift to the home by tomorrow morning.  [facility] will be providing home health services when she returns home.  HOme health orders are in place.  Bedside nursing will discharge patient home by family fleeta when patient returns from dialysis today.  12/25/23 1544 - Spouse states that [employer] called and they plan to deliver hoyer lift to the home likely today.  Patient's spouse plans to provide transportation home today via private wheelchair [place].         Patient Goals and CMS Choice            Discharge Placement                       Discharge Plan and Services Additional resources added to the After Visit Summary for                                       Social Drivers of Health (SDOH) Interventions SDOH Screenings   Food Insecurity: No Food Insecurity (12/17/2023)  Housing: Low Risk  (12/17/2023)  Transportation Needs: No Transportation Needs (12/17/2023)  Utilities: Not At Risk (12/17/2023)  Alcohol Screen: Low Risk  (08/16/2021)  Depression (PHQ2-9): High Risk (10/26/2023)  Financial Resource Strain: Low Risk  (08/16/2021)  Physical Activity: Inactive (08/16/2021)  Social Connections: Socially Isolated (12/17/2023)  Stress: No Stress Concern Present (08/16/2021)  Tobacco Use: Medium Risk (12/17/2023)     Readmission Risk Interventions    12/21/2023    4:04 PM 10/02/2023   12:43 PM 08/06/2023    4:00 PM  Readmission Risk Prevention Plan   Transportation Screening Complete Complete Complete  Medication Review Oceanographer) Complete  Referral to Pharmacy  PCP or Specialist appointment within 3-5 days of discharge Complete Complete Complete  HRI or Home Care Consult Complete Complete Complete  SW Recovery Care/Counseling Consult Complete Complete Complete  Palliative Care Screening Not Applicable Not Applicable Not Applicable  Skilled Nursing Facility Not Applicable Not Applicable Not Applicable

## 2023-12-25 NOTE — Progress Notes (Signed)
 PT Cancellation Note  Patient Details Name: Susan [name] MRN: [id number] DOB: 08/11/1950   Cancelled Treatment:    Reason Eval/Treat Not Completed: Patient at procedure or test/unavailable (HD). Will follow up for PT treatment as schedule permits. Per chart, potential d/c home today.  Takyah Ciaramitaro, PT, DPT Acute Rehabilitation Services  Personal: Secure Chat Rehab Office: [phone]  Darice LITTIE Almas 12/25/2023, 8:52 AM

## 2023-12-25 NOTE — Progress Notes (Signed)
   12/25/23 1237  Vitals  Temp 98.4 F (36.9 C)  Pulse Rate 66  Resp 13  BP 122/63  SpO2 100 %  O2 Device Nasal Cannula  Weight  (No wt due to special bed.)  Type of Weight Post-Dialysis  Oxygen  Therapy  O2 Flow Rate (L/min) 3 L/min  Patient Activity (if Appropriate) In bed  Pulse Oximetry Type Continuous  Oximetry Probe Site Changed No  Post Treatment  Dialyzer Clearance Clear  Hemodialysis Intake (mL) 0 mL  Liters Processed 82  Fluid Removed (mL) 2000 mL  Tolerated HD Treatment Yes  Post-Hemodialysis Comments HD tolerated  AVG/AVF Arterial Site Held (minutes) 0 minutes  AVG/AVF Venous Site Held (minutes) 0 minutes     Received patient in bed to unit.  Alert and oriented.  Informed consent signed and in chart.    TX duration: 3.25hrs   Patient tolerated well.  Transported back to the room  Alert, without acute distress.  Hand-off given to patient's nurse.    Access used:RCVC Access issues: none   Total UF removed: 2L Medication(s) given:  see eMAR   [place]fin LITTIE Dolores, RN [facility]

## 2023-12-25 NOTE — Discharge Planning (Signed)
 [facility] Patient Discharge Orders - [facility] CLINIC: [place]  Patient's name: Susan [name] Admit/DC Dates: 12/17/2023 - 12/25/23  DISCHARGE DIAGNOSES: Septic shock, ?due to UTI v. LE cellulitis - better with abx  AMS - improved with holding sedating meds and HD Hypotension - on midodrine   HD ORDER CHANGES: Heparin  change: no EDW Change: no   *Up on fluids here, keep EDW for now - eval as outpatient*  Bath Change: no  ANEMIA MANAGEMENT: Aranesp : Given: yes   Amount/Date of last dose: 100mcg on 7/29 ESA dose for discharge: mircera 150 mcg IV q 2 weeks, to start on 12/29/23 IV Iron  dose at discharge: per protocol Transfusion: Given: no  BONE/MINERAL MEDICATIONS: Hectorol /Calcitriol  change: no Sensipar/Parsabiv change: no  ACCESS INTERVENTION/CHANGE: no Details:   RECENT LABS: Recent Labs  Lab 12/19/23 0544 12/20/23 0308 12/23/23 0308 12/24/23 1326  HGB 8.6*   < > 9.1*  --   NA 131*   < > 131* 130*  K 4.0   < > 4.4 4.4  CALCIUM  9.0   < > 8.6* 8.9  PHOS 6.0*   < > 4.3  --   ALBUMIN  2.5*  --   --   --    < > = values in this interval not displayed.    IV ANTIBIOTICS: no Details:  OTHER ANTICOAGULATION: On Eliquis    OTHER/APPTS/LAB ORDERS: - Going home with HH  D/C Meds to be reconciled by nurse after every discharge.  Completed By: Izetta Boehringer, PA-C [facility] Pager [phone]   Reviewed by: MD:______ RN_______

## 2023-12-25 NOTE — Progress Notes (Signed)
 [facility] Progress Note   Subjective:   Patient seen on dialysis.  States she feels well.  Hoping to discharge soon  Objective Vitals:   12/25/23 0734 12/25/23 0843 12/25/23 0859 12/25/23 0930  BP: (!) 92/47 (!) 103/50 (!) 107/57 (!) 111/46  Pulse: 64 (!) 58 60 (!) 58  Resp:  11 12 14   Temp: 98.2 F (36.8 C) 98.1 F (36.7 C)    TempSrc:      SpO2: 94% 94% 95% 95%  Weight:      Height:       General: Chronically ill-appearing, in bed in no distress HENT: NCAT, EOMI Lungs: Bilateral chest rise with no increased work of breathing Cardiovascular: Normal rate with no rub Abdomen: soft, nondistended Neuro: Alert and alert, oriented to person place and time Dialysis access: R TDC in place; multiple failed AVF  Additional Objective Labs: Basic Metabolic Panel: Recent Labs  Lab 12/19/23 0544 12/20/23 0308 12/22/23 0106 12/23/23 0308 12/24/23 1326  NA 131*   < > 128* 131* 130*  K 4.0   < > 4.2 4.4 4.4  CL 91*   < > 92* 94* 94*  CO2 20*   < > 20* 21* 21*  GLUCOSE 146*   < > 189* 173* 413*  BUN 86*   < > 82* 45* 62*  CREATININE 7.85*   < > 7.18* 4.65* 6.57*  CALCIUM  9.0   < > 8.6* 8.6* 8.9  PHOS 6.0*  --  5.3* 4.3  --    < > = values in this interval not displayed.   Liver Function Tests: Recent Labs  Lab 12/19/23 0544  ALBUMIN  2.5*   No results for input(s): LIPASE, AMYLASE in the last 168 hours. CBC: Recent Labs  Lab 12/19/23 0544 12/20/23 0308 12/21/23 0520 12/22/23 0106 12/23/23 0308  WBC 15.8* 16.0* 13.9* 10.8* 10.5  NEUTROABS  --   --  11.7*  --   --   HGB 8.6* 8.8* 9.2* 9.4* 9.1*  HCT 27.7* 28.3* 29.8* 30.1* 29.7*  MCV 85.0 84.7 83.9 84.6 85.8  PLT 317 347 338 324 310   Blood Culture    Component Value Date/Time   SDES  12/17/2023 1332    URINE, CLEAN CATCH Performed at [facility], [address] [place], [place] [address]    SPECREQUEST  12/17/2023 1332    NONE Reflexed from [id number] Performed at [facility], [address], [place], [place] [address]    CULT >=100,000 COLONIES/mL PROTEUS MIRABILIS (A) 12/17/2023 1332   REPTSTATUS 12/19/2023 FINAL 12/17/2023 1332     CBG: Recent Labs  Lab 12/24/23 0810 12/24/23 1155 12/24/23 1622 12/24/23 2031 12/25/23 0736  GLUCAP 340* 400* 312* 283* 298*    Medications:    amiodarone   200 mg Oral Daily   apixaban   5 mg Oral BID   calcitRIOL   2.25 mcg Oral Q T,Th,Sa-HD   Chlorhexidine  Gluconate Cloth  6 each Topical Daily   Chlorhexidine  Gluconate Cloth  6 each Topical Q0600   clonazepam   0.25 mg Oral BID   collagenase    Topical Daily   darbepoetin (ARANESP ) injection - DIALYSIS  100 mcg Subcutaneous Q Tue-1800   insulin  aspart  0-20 Units Subcutaneous TID WC   insulin  aspart  0-5 Units Subcutaneous QHS   insulin  glargine-yfgn  10 Units Subcutaneous Daily   levothyroxine   50 mcg Oral Q0600   liver oil-zinc  oxide   Topical BID   midodrine   10 mg Oral TID WC  sevelamer  carbonate  800 mg Oral TID WC    Dialysis Orders:  [facility] TTS 4 hr - last HD 12/15/23 400 QB/ Manual 800 DFR 2K/2 Ca bath Heparin  4000 U bolus EDW 77.4 Mircera 100 mcg last given 12/01/23 Venofer  100 mg q treatment; last given 12/15/23 Calcitriol  2.5 mcg every treatment Sensipar 30 mg q treatment   Assessment/Plan: Septic shock: Felt to be from UTI or cellulitis.  Overall improved with treatment  ESRD:  TTS schedule at [facility].  Dialysis [place] today because of high census.  Plan to get back on schedule tomorrow if she remains inpatient  Hypertension/volume: Continue midodrine .  Volume removal on dialysis as tolerated  Anemia: On mircera at OP HD.  Dosing with dialysis  Metabolic bone disease: On calcitriol  2.5 mcg at treatment and sensipar 30 mg at treatment. Started sevelamer .  PAF: anticoag per primary DM2: continue SSI per primary Altered mental status: Improved with holding sedating medications   12/25/2023, 9:57 AM  [employer]

## 2023-12-25 NOTE — Telephone Encounter (Signed)
 Contacted pt. By phone. She was not aware of medication change. She will pick up new order of bactirum and start the 7 day trial.

## 2023-12-25 NOTE — Progress Notes (Signed)
 DISCHARGE NOTE HOME Susan [name] [facility] to be discharged Home per MD order. Discussed prescriptions and follow up appointments with the patient. Prescriptions given to patient; medication list explained in detail. Patient verbalized understanding.  Skin clean, dry and intact without evidence of skin break down, no evidence of skin tears noted. IV catheter discontinued intact. Site without signs and symptoms of complications. Dressing and pressure applied. Pt denies pain at the site currently. No complaints noted.  See Lda for wounds sacral Leg and HD cath right Subclatian  An After Visit Summary (AVS) was printed and given to the patient. Patient escorted via wheelchair, and discharged home via private auto.  Peyton SHAUNNA Pepper, RN

## 2023-12-25 NOTE — Discharge Summary (Signed)
 Physician Discharge Summary   Patient: Susan [name] MRN: [id number] DOB: [birth year]  Admit date:     12/17/2023  Discharge date:   Discharge Physician: Concepcion Riser   PCP: Duanne Butler DASEN, MD   Recommendations at discharge:   PCP follow up in week. HD as scheduled. Cardiology follow up as scheduled.  Discharge Diagnoses: Principal Problem:   Septic shock (HCC) Active Problems:   Uncontrolled type 2 diabetes mellitus with hyperglycemia, with long-term current use of insulin  (HCC)   ESRD (end stage renal disease) (HCC)   Paroxysmal atrial fibrillation (HCC)   Chronic hypotension   Anemia of chronic disease   Acute on chronic HFrEF (heart failure with reduced ejection fraction) (HCC)   Chronic venous hypertension (idiopathic) with ulcer and inflammation of left lower extremity (HCC)  Resolved Problems:   * No resolved hospital problems. *  Hospital Course: 73 yo F PMH ESRD on HD, DM2 on insulin , CAD, HFrEF, pAF, decub ulcers POA, hx enterococcus bacteremia who presented to [facility] ED 12/17/23 for wound check due to increased drainage from sacral decub at home.    Assessment and Plan: Septic shock Due to UTI, decubitus wound infection. Initially required vasopressor medications made to MAP greater than 65.  Subsequently been weaned off.  Blood pressure still soft however patient does have soft BP at baseline, systolic blood pressure goal 90s.  Continue midodrine  3 times daily.  Shock appears resolved.  Per nephrology recommendations would avoid excessive fluid administration.  She tolerated HD well. Hemodynamically stable to be discharged home.   Acute metabolic encephalopathy Had increased lethargy last couple days, seemed markedly improved.  Likely medication side effect of medications combined with low intravascular volume end-stage renal disease.  Showing marked improvement after reduced clonazepam , advised to limit opiates and sedatives.    Proteus Urinary tract  infection Finished Rocephin  therapy.   Possible cellulitis from sacral/LLE wound (POA) Evaluated by Ortho recommending wound care and outpatient follow-up.     End-stage renal disease Receiving dialysis TTS.  Blood pressure precarious.   Acute HFrEF Therapy limited by hypotension.  HD/UF able to remove 2 L yesterday 7/29.  Volume status per nephrology.   Paroxysmal atrial fibrillation Amiodarone  on board.  Continue anticoagulation.   Diabetes mellitus She is on insulin  pump with she will resume upon discharge. Advised to keep blood sugar log for PCP to adjust insulin  regimen.   Physical debilitation muscle weakness Was previously bedbound.  Husband has Nurse, adult and hospital bed at home.  Plan to discharge home with [facility] services.       Consultants: Nephrology, surgery, orthopedics. Procedures performed: none  Disposition: Home Diet recommendation:  Discharge Diet Orders (From admission, onward)     Start     Ordered   12/25/23 0000  Diet - low sodium heart healthy        12/25/23 1311           Cardiac and Carb modified diet DISCHARGE MEDICATION: Allergies as of 12/25/2023       Reactions   Codeine Nausea And Vomiting   Keflex  [cephalexin ] Diarrhea   Macrobid  [nitrofurantoin ] Diarrhea, Nausea And Vomiting        Medication List     STOP taking these medications    nitrofurantoin  (macrocrystal-monohydrate) 100 MG capsule Commonly known as: MACROBID    sulfamethoxazole -trimethoprim  800-160 MG tablet Commonly known as: BACTRIM  DS       TAKE these medications    acetaminophen  500 MG tablet Commonly known as: TYLENOL  Take 1,000  mg by mouth every 6 (six) hours as needed for moderate pain (pain score 4-6), fever or headache.   albuterol  108 (90 Base) MCG/ACT inhaler Commonly known as: VENTOLIN  HFA Inhale 1-2 puffs into the lungs every 6 (six) hours as needed for shortness of breath.   amiodarone  200 MG tablet Commonly known as: PACERONE  Take 1  tablet (200 mg total) by mouth daily.   apixaban  5 MG Tabs tablet Commonly known as: ELIQUIS  Take 1 tablet (5 mg total) by mouth 2 (two) times daily.   atorvastatin  80 MG tablet Commonly known as: LIPITOR  Take 1 tablet (80 mg total) by mouth daily.   calcitRIOL  0.25 MCG capsule Commonly known as: ROCALTROL  Take 9 capsules (2.25 mcg total) by mouth Every Tuesday,Thursday,and Saturday with dialysis. What changed:  how much to take when to take this   clonazePAM  0.5 MG disintegrating tablet Commonly known as: KLONOPIN  Take 1 tablet (0.5 mg total) by mouth 2 (two) times daily. Stop lorazepam .   DIALYVITE TABLET Tabs Take 1 tablet by mouth daily.   folic acid  1 MG tablet Commonly known as: FOLVITE  TAKE 1 TABLET BY MOUTH EVERY DAY   insulin  aspart 100 UNIT/ML injection Commonly known as: novoLOG  Inject 100 Units into the skin at bedtime. Load insulin  pump with 100 units, use over night.   lactulose  10 GM/15ML solution Commonly known as: CHRONULAC  Take 20 g by mouth daily as needed (constipation).   Lantus  SoloStar 100 UNIT/ML Solostar Pen Generic drug: insulin  glargine Inject 40-44 Units into the skin at bedtime as needed (insulin  pump malfunction).   levothyroxine  50 MCG tablet Commonly known as: SYNTHROID  Take 1 tablet (50 mcg total) by mouth daily before breakfast.   MELATONIN PO Take 1 tablet by mouth at bedtime.   methocarbamol  750 MG tablet Commonly known as: ROBAXIN  TAKE 1 TABLET (750 MG TOTAL) BY MOUTH EVERY 6 (SIX) HOURS AS NEEDED FOR MUSCLE SPASMS   midodrine  10 MG tablet Commonly known as: PROAMATINE  Take 1-2 tablets (10-20 mg total) by mouth Every Tuesday,Thursday,and Saturday with dialysis.   MIRCERA IJ Mircera   nitroGLYCERIN  0.4 MG SL tablet Commonly known as: NITROSTAT  Place 1 tablet (0.4 mg total) under the tongue every 5 (five) minutes as needed.   nystatin  cream Commonly known as: MYCOSTATIN  Apply 1 Application topically 2 (two) times  daily.   ofloxacin 0.3 % ophthalmic solution Commonly known as: OCUFLOX Place 1 drop into the left eye 4 (four) times daily.   Omnipod 5 DexG7G6 Pods Gen 5 Misc Inject 1 Application into the skin 3 days.   oxyCODONE -acetaminophen  5-325 MG tablet Commonly known as: PERCOCET/ROXICET Take 1 tablet by mouth every 6 (six) hours as needed for severe pain (pain score 7-10).   OXYGEN  Inhale 2 L/min into the lungs at bedtime.   pantoprazole  40 MG tablet Commonly known as: PROTONIX  Take 1 tablet (40 mg total) by mouth daily.   polyethylene glycol 17 g packet Commonly known as: MIRALAX  / GLYCOLAX  Take 17 g by mouth daily. What changed:  when to take this reasons to take this   prednisoLONE acetate 1 % ophthalmic suspension Commonly known as: PRED FORTE 1 drop 4 (four) times daily.   pregabalin  150 MG capsule Commonly known as: LYRICA  Take 150 mg by mouth 2 (two) times daily.   sevelamer  carbonate 800 MG tablet Commonly known as: RENVELA  Take 800-1,600 mg by mouth See admin instructions. Take 2 tablets (1600mg ) by mouth three times daily with meals and take 1 tablet (800mg ) daily as  needed for snacks.   venlafaxine  XR 75 MG 24 hr capsule Commonly known as: EFFEXOR -XR Take 1 capsule (75 mg total) by mouth daily with breakfast. Stop trintellix    VITAMIN C  PO Take 1 tablet by mouth daily.   ZINC  PO Take 1 tablet by mouth daily.               Durable Medical Equipment  (From admission, onward)           Start     Ordered   12/24/23 1428  For home use only DME Other see comment  Once       Comments: Patient is bed bound with hemodialysis need.  Patient need hoyer lift to be transferred from bed to wheelchair at the home since patient is unable to stand.  Question:  Length of Need  Answer:  12 Months   12/24/23 1429              Discharge Care Instructions  (From admission, onward)           Start     Ordered   12/25/23 0000  Discharge wound care:        Comments: Cleanse sacral wound with Vashe wound cleanser Soila [phone]) do not rinse and allow to air dry.  Apply 1/4 thick layer of Santyl  to wound bed, top with saline moist gauze, dry gauze and ABD pad and tape.    Comments: Apply Xeroform gauze to left leg Q day, then cover with foam dressing.  Change foam dressing Q 3 days or PRN soiling    Comments: Cleanse sacral wound with Vashe wound cleanser Soila [phone]) do not rinse and allow to air dry. Apply Dakin's moistened gauze to wound bed daily x 3 days, cover with dry gauze and ABD pad and tape   12/25/23 1311            Follow-up Information     Harden Jerona GAILS, MD Follow up in 1 week(s).   Specialty: Orthopedic Surgery Contact information: [address] [place]  [place] [address] [phone]         [facility] [place]  Follow up.   Why: [facility] will provide home health services.  They will call you in the next 24-48 hours to set up services. Contact information: [address] [place] [address] [phone]                Discharge Exam: Filed Weights   12/23/23 0500 12/24/23 0325  Weight: 79.5 kg 88 kg      12/25/2023    1:48 PM 12/25/2023   12:37 PM 12/25/2023   12:30 PM  Vitals with BMI  Weight  --   Systolic 126 122 871  Diastolic 45 63 58  Pulse 71 66 66    General - Elderly ill Caucasian female, no apparent distress HEENT - PERRLA, EOMI, atraumatic head, non tender sinuses. Lung - Clear, diffuse rales, rhonchi, no wheezes. Heart - S1, S2 heard, no murmurs, rubs, chronic pedal edema. Abdomen - Soft, non tender, bowel sounds good Neuro - Alert, awake and oriented x 3, non focal exam. Skin - Warm and dry.  Condition at discharge: stable  The results of significant diagnostics from this hospitalization (including imaging, microbiology, ancillary and laboratory) are listed below for reference.   Imaging Studies: CT ABDOMEN PELVIS WO  CONTRAST Result Date: 12/17/2023 CLINICAL DATA:  Decubitus wounds. EXAM: CT ABDOMEN AND PELVIS WITHOUT CONTRAST TECHNIQUE: Multidetector CT imaging  of the abdomen and pelvis was performed following the standard protocol without IV contrast. RADIATION DOSE REDUCTION: This exam was performed according to the departmental dose-optimization program which includes automated exposure control, adjustment of the mA and/or kV according to patient size and/or use of iterative reconstruction technique. COMPARISON:  Oct 01, 2023. FINDINGS: Lower chest: No acute abnormality. Hepatobiliary: Cholelithiasis. No biliary dilatation. Liver is unremarkable. Pancreas: Unremarkable. No pancreatic ductal dilatation or surrounding inflammatory changes. Spleen: Multiple small calcified splenic granulomas are noted. Adrenals/Urinary Tract: Adrenal glands appear normal. Bilateral renal atrophy is noted consistent with end-stage renal disease. No hydronephrosis or renal obstruction is noted. Urinary bladder is unremarkable. Stomach/Bowel: Stomach is within normal limits. Appendix appears normal. No evidence of bowel wall thickening, distention, or inflammatory changes. Moderate amount of stool is seen in the sigmoid colon and rectum. Vascular/Lymphatic: Aortic atherosclerosis. No enlarged abdominal or pelvic lymph nodes. Reproductive: Status post hysterectomy. No adnexal masses. Other: No ascites or hernia is noted. Large sacral decubitus ulceration or wound is noted, but does not appear to extend to underlying bone. Musculoskeletal: No acute or significant osseous findings. IMPRESSION: Interval development of large sacral decubitus ulceration or wound is noted described above. Cholelithiasis. Bilateral renal atrophy consistent with history of end-stage renal disease. Moderate amount of stool is seen in sigmoid colon and rectum concerning for possible impaction. Aortic Atherosclerosis (ICD10-I70.0). Electronically Signed   By: Lynwood Landy Raddle  M.D.   On: 12/17/2023 11:48   DG Chest Port 1 View Result Date: 12/17/2023 CLINICAL DATA:  . questionable sepsis.  Left large ache.  Confusion EXAM: PORTABLE CHEST 1 VIEW COMPARISON:  Chest x-ray 10/01/2023 FINDINGS: Underinflation. Stable cardiopericardial silhouette with calcified aorta. Bronchovascular crowding. No consolidation, pneumothorax or effusion. Double-lumen right IJ catheter with tip overlying the SVC right atrial junction region. Overlapping cardiac leads. Degenerative changes along the spine. IMPRESSION: Underinflation. Bronchovascular crowding. Double-lumen right IJ catheter. No consolidation. Electronically Signed   By: Ranell Bring M.D.   On: 12/17/2023 11:42    Microbiology: Results for orders placed or performed during the hospital encounter of 12/17/23  Resp panel by RT-PCR (RSV, Flu A&B, Covid) Anterior Nasal Swab     Status: None   Collection Time: 12/17/23 11:28 AM   Specimen: Anterior Nasal Swab  Result Value Ref Range Status   SARS Coronavirus 2 by RT PCR NEGATIVE NEGATIVE Final    Comment: (NOTE) SARS-CoV-2 target nucleic acids are NOT DETECTED.  The SARS-CoV-2 RNA is generally detectable in upper respiratory specimens during the acute phase of infection. The lowest concentration of SARS-CoV-2 viral copies this assay can detect is 138 copies/mL. A negative result does not preclude SARS-Cov-2 infection and should not be used as the sole basis for treatment or other patient management decisions. A negative result may occur with  improper specimen collection/handling, submission of specimen other than nasopharyngeal swab, presence of viral mutation(s) within the areas targeted by this assay, and inadequate number of viral copies(<138 copies/mL). A negative result must be combined with clinical observations, patient history, and epidemiological information. The expected result is Negative.  Fact Sheet for Patients:   BloggerCourse.com  Fact Sheet for Healthcare Providers:  SeriousBroker.it  This test is no t yet approved or cleared by the [place]  FDA and  has been authorized for detection and/or diagnosis of SARS-CoV-2 by FDA under an Emergency Use Authorization (EUA). This EUA will remain  in effect (meaning this test can be used) for the duration of the COVID-19 declaration under Section 564(b)(1)  of the Act, 21 U.S.C.section 360bbb-3(b)(1), unless the authorization is terminated  or revoked sooner.       Influenza A by PCR NEGATIVE NEGATIVE Final   Influenza B by PCR NEGATIVE NEGATIVE Final    Comment: (NOTE) The Xpert Xpress SARS-CoV-2/FLU/RSV plus assay is intended as an aid in the diagnosis of influenza from Nasopharyngeal swab specimens and should not be used as a sole basis for treatment. Nasal washings and aspirates are unacceptable for Xpert Xpress SARS-CoV-2/FLU/RSV testing.  Fact Sheet for Patients: BloggerCourse.com  Fact Sheet for Healthcare Providers: SeriousBroker.it  This test is not yet approved or cleared by the [place]  FDA and has been authorized for detection and/or diagnosis of SARS-CoV-2 by FDA under an Emergency Use Authorization (EUA). This EUA will remain in effect (meaning this test can be used) for the duration of the COVID-19 declaration under Section 564(b)(1) of the Act, 21 U.S.C. section 360bbb-3(b)(1), unless the authorization is terminated or revoked.     Resp Syncytial Virus by PCR NEGATIVE NEGATIVE Final    Comment: (NOTE) Fact Sheet for Patients: BloggerCourse.com  Fact Sheet for Healthcare Providers: SeriousBroker.it  This test is not yet approved or cleared by the [place]  FDA and has been authorized for detection and/or diagnosis of SARS-CoV-2 by FDA under an Emergency Use  Authorization (EUA). This EUA will remain in effect (meaning this test can be used) for the duration of the COVID-19 declaration under Section 564(b)(1) of the Act, 21 U.S.C. section 360bbb-3(b)(1), unless the authorization is terminated or revoked.  Performed at [facility], [address] [place], [place] [address]   Blood culture (routine x 2)     Status: None   Collection Time: 12/17/23 11:45 AM   Specimen: BLOOD  Result Value Ref Range Status   Specimen Description BLOOD RIGHT ASSIST CONTROL  Final   Special Requests   Final    BOTTLES DRAWN AEROBIC AND ANAEROBIC Blood Culture adequate volume   Culture   Final    NO GROWTH 5 DAYS Performed at [facility], [address] [place], [place] [address]    Report Status 12/22/2023 FINAL  Final  Blood culture (routine x 2)     Status: None   Collection Time: 12/17/23 12:10 PM   Specimen: BLOOD  Result Value Ref Range Status   Specimen Description BLOOD LEFT ARM  Final   Special Requests   Final    BOTTLES DRAWN AEROBIC AND ANAEROBIC Blood Culture results may not be optimal due to an inadequate volume of blood received in culture bottles   Culture   Final    NO GROWTH 5 DAYS Performed at [facility], [address], [place], [place] [address]    Report Status 12/22/2023 FINAL  Final  Urine Culture     Status: Abnormal   Collection Time: 12/17/23  1:32 PM   Specimen: Urine, Clean Catch  Result Value Ref Range Status   Specimen Description   Final    URINE, CLEAN CATCH Performed at [facility], [address] [place], [place] [address]    Special Requests   Final    NONE Reflexed from [id number] Performed at [facility], [address] [place], [place] [address]    Culture >=100,000 COLONIES/mL PROTEUS MIRABILIS (A)  Final   Report Status 12/19/2023 FINAL  Final   Organism ID, Bacteria PROTEUS MIRABILIS (A)  Final      Susceptibility   Proteus mirabilis - MIC*    AMPICILLIN  <=2 SENSITIVE Sensitive  CEFAZOLIN  <=4  SENSITIVE Sensitive     CEFEPIME  <=0.12 SENSITIVE Sensitive     CEFTRIAXONE  <=0.25 SENSITIVE Sensitive     CIPROFLOXACIN  <=0.25 SENSITIVE Sensitive     GENTAMICIN <=1 SENSITIVE Sensitive     IMIPENEM 2 SENSITIVE Sensitive     NITROFURANTOIN  128 RESISTANT Resistant     TRIMETH /SULFA  <=20 SENSITIVE Sensitive     AMPICILLIN /SULBACTAM <=2 SENSITIVE Sensitive     PIP/TAZO <=4 SENSITIVE Sensitive ug/mL    * >=100,000 COLONIES/mL PROTEUS MIRABILIS  MRSA Next Gen by PCR, Nasal     Status: None   Collection Time: 12/17/23  4:54 PM   Specimen: Nasal Mucosa; Nasal Swab  Result Value Ref Range Status   MRSA by PCR Next Gen NOT DETECTED NOT DETECTED Final    Comment: (NOTE) The GeneXpert MRSA Assay (FDA approved for NASAL specimens only), is one component of a comprehensive MRSA colonization surveillance program. It is not intended to diagnose MRSA infection nor to guide or monitor treatment for MRSA infections. Test performance is not FDA approved in patients less than 37 years old. Performed at [facility], [address], [place], [place] [address]    *Note: Due to a large number of results and/or encounters for the requested time period, some results have not been displayed. A complete set of results can be found in Results Review.    Labs: CBC: Recent Labs  Lab 12/19/23 0544 12/20/23 0308 12/21/23 0520 12/22/23 0106 12/23/23 0308  WBC 15.8* 16.0* 13.9* 10.8* 10.5  NEUTROABS  --   --  11.7*  --   --   HGB 8.6* 8.8* 9.2* 9.4* 9.1*  HCT 27.7* 28.3* 29.8* 30.1* 29.7*  MCV 85.0 84.7 83.9 84.6 85.8  PLT 317 347 338 324 310   Basic Metabolic Panel: Recent Labs  Lab 12/19/23 0544 12/20/23 0308 12/21/23 0520 12/22/23 0106 12/23/23 0308 12/24/23 1326  NA 131* 130* 130* 128* 131* 130*  K 4.0 4.6 4.0 4.2 4.4 4.4  CL 91* 94* 92* 92* 94* 94*  CO2 20* 16* 20* 20* 21* 21*  GLUCOSE 146* 189* 193* 189* 173* 413*  BUN 86* 102* 66* 82* 45* 62*  CREATININE 7.85* 8.58* 6.60*  7.18* 4.65* 6.57*  CALCIUM  9.0 8.9 8.8* 8.6* 8.6* 8.9  MG  --   --   --  2.1 2.0  --   PHOS 6.0*  --   --  5.3* 4.3  --    Liver Function Tests: Recent Labs  Lab 12/19/23 0544  ALBUMIN  2.5*   CBG: Recent Labs  Lab 12/24/23 0810 12/24/23 1155 12/24/23 1622 12/24/23 2031 12/25/23 0736  GLUCAP 340* 400* 312* 283* 298*    Discharge time spent: 34 minutes.  Signed: Concepcion Riser, MD [facility] 12/25/2023

## 2023-12-25 NOTE — Plan of Care (Signed)

## 2023-12-25 NOTE — Progress Notes (Signed)
 D/C order noted. Contacted [facility] in [place] to be advised of pt's d/c today and that pt should resume care tomorrow.   Randine Mungo Dialysis Navigator [phone]

## 2023-12-26 ENCOUNTER — Telehealth (HOSPITAL_COMMUNITY): Payer: Self-pay | Admitting: Nephrology

## 2023-12-26 DIAGNOSIS — D689 Coagulation defect, unspecified: Secondary | ICD-10-CM | POA: Diagnosis not present

## 2023-12-26 DIAGNOSIS — N186 End stage renal disease: Secondary | ICD-10-CM | POA: Diagnosis not present

## 2023-12-26 DIAGNOSIS — N2581 Secondary hyperparathyroidism of renal origin: Secondary | ICD-10-CM | POA: Diagnosis not present

## 2023-12-26 DIAGNOSIS — Z992 Dependence on renal dialysis: Secondary | ICD-10-CM | POA: Diagnosis not present

## 2023-12-26 NOTE — Telephone Encounter (Signed)
 Transition of care contact from inpatient facility  Date of Discharge: 12/25/23 Date of Contact:12/26/23 - attempt #1 Method of contact: Phone  Attempted to contact patient to discuss transition of care from inpatient admission. Patient did not answer the phone. Message was left on the patient's voicemail with call back number [phone].  Izetta Boehringer, PA-C [employer] Pager [phone]

## 2023-12-28 ENCOUNTER — Ambulatory Visit: Admitting: Orthopedic Surgery

## 2023-12-28 ENCOUNTER — Encounter: Payer: Self-pay | Admitting: Orthopedic Surgery

## 2023-12-28 DIAGNOSIS — I87332 Chronic venous hypertension (idiopathic) with ulcer and inflammation of left lower extremity: Secondary | ICD-10-CM

## 2023-12-28 DIAGNOSIS — S88111D Complete traumatic amputation at level between knee and ankle, right lower leg, subsequent encounter: Secondary | ICD-10-CM

## 2023-12-28 DIAGNOSIS — Z89011 Acquired absence of right thumb: Secondary | ICD-10-CM

## 2023-12-28 NOTE — Progress Notes (Signed)
 Office Visit Note   Patient: Susan [name]           Date of Birth: 1951/02/06           MRN: [id number] Visit Date: 12/28/2023              Requested by: Duanne Butler DASEN, MD [address] [place],  [place] [address] PCP: Duanne Butler DASEN, MD  Chief Complaint  Patient presents with   Left Leg - Wound Check      HPI: Patient is a 73 year old woman who has recently discharged from the hospital.  She has recurrent ulceration of the lateral aspect of the left leg with a stable right transtibial amputation.  Patient also has sacral decubitus ulcer.  They are scheduled for follow-up in the [place] wound center for the sacral ulcer.  Assessment & Plan: Visit Diagnoses:  1. Below-knee amputation of right lower extremity, subsequent encounter (HCC)   2. 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  Elevate is much as possible.  Follow-Up Instructions: No follow-ups on file.   Ortho Exam  Patient is alert, oriented, no adenopathy, well-dressed, normal affect, normal respiratory effort. Patient is full assist with a Hoyer lift for transfers.  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  The ulcer measures 9 x 5 cm with no purulence no drainage the wound is dry with large eschar.    Imaging: No results found.   Labs: Lab Results  Component Value Date   HGBA1C 6.8 (H) 10/01/2023   HGBA1C 9.0 (H) 02/27/2023   HGBA1C 7.9 (H) 08/27/2022   ESRSEDRATE 17 11/10/2022   CRP 3.6 (H) 11/10/2022   CRP 0.6 09/01/2022   CRP 0.7 08/22/2020   LABURIC 4.7 08/09/2020   LABURIC 9.1 (H) 10/12/2018   LABURIC 9.0 (H) 03/06/2018   REPTSTATUS 12/19/2023 FINAL 12/17/2023   GRAMSTAIN  11/14/2022    MODERATE WBC PRESENT,BOTH PMN AND MONONUCLEAR RARE SQUAMOUS  EPITHELIAL CELLS PRESENT NO ORGANISMS SEEN Performed at [facility], [address] [place], [place] [address]    CULT >=100,000 COLONIES/mL PROTEUS MIRABILIS (A) 12/17/2023   LABORGA PROTEUS MIRABILIS (A) 12/17/2023     Lab Results  Component Value Date   ALBUMIN  2.5 (L) 12/19/2023   ALBUMIN  2.3 (L) 12/17/2023   ALBUMIN  2.5 (L) 10/09/2023   PREALBUMIN 12 (L) 11/10/2022   PREALBUMIN 13 (L) 05/02/2022    Lab Results  Component Value Date   MG 2.0 12/23/2023   MG 2.1 12/22/2023   MG 2.4 12/17/2023   Lab Results  Component Value Date   VD25OH 42.14 05/02/2022    Lab Results  Component Value Date   PREALBUMIN 12 (L) 11/10/2022   PREALBUMIN 13 (L) 05/02/2022      Latest Ref Rng & Units 12/23/2023    3:08 AM 12/22/2023    1:06 AM 12/21/2023    5:20 AM  CBC EXTENDED  WBC 4.0 - 10.5 K/uL 10.5  10.8  13.9   RBC 3.87 - 5.11 MIL/uL 3.46  3.56  3.55   Hemoglobin 12.0 - 15.0 g/dL 9.1  9.4  9.2   HCT 63.9 - 46.0 % 29.7  30.1  29.8   Platelets 150 - 400 K/uL 310  324  338  NEUT# 1.7 - 7.7 K/uL   11.7   Lymph# 0.7 - 4.0 K/uL   0.6      There is no height or weight on file to calculate BMI.  Orders:  No orders of the defined types were placed in this encounter.  No orders of the defined types were placed in this encounter.    Procedures: No procedures performed  Clinical Data: No additional findings.  ROS:  All other systems negative, except as noted in the HPI. Review of Systems  Objective: Vital Signs: There were no vitals taken for this visit.  Specialty Comments:  No specialty comments available.  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) 04/07/2022   Allergy, unspecified, initial encounter 04/07/2022   Dependence on renal dialysis (HCC) 04/07/2022   Gout due to renal impairment, unspecified site 04/07/2022   Hypertensive heart and chronic kidney disease with heart failure and with stage 5 chronic kidney disease, or end stage renal disease (HCC) 04/07/2022   Personal history of transient ischemic attack (TIA), and cerebral infarction without residual deficits 04/07/2022   Renal osteodystrophy  04/07/2022   Venous stasis ulcer of right calf (HCC) 03/31/2022   Fistula, colovaginal 03/26/2022   Diarrhea 03/26/2022   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   Sepsis without acute organ dysfunction (HCC)    Bacteremia    Acute pancreatitis 02/01/2022   Abdominal pain 02/01/2022   SIRS (systemic inflammatory response syndrome) (HCC) 02/01/2022   Transaminitis 02/01/2022   History of anemia due to chronic kidney disease 02/01/2022   Paroxysmal atrial fibrillation (HCC) 02/01/2022   Uncontrolled type 2 diabetes mellitus with hyperglycemia, with long-term current use of insulin  (HCC) 01/14/2022   NSTEMI (non-ST elevated myocardial infarction) (HCC) 03/05/2021   Acute renal failure superimposed on stage 4 chronic kidney disease (HCC) 08/22/2020   Hypoalbuminemia 05/25/2020   GERD (gastroesophageal reflux disease) 05/25/2020   Pressure injury of skin  05/17/2020   Acute on chronic combined systolic and diastolic congestive heart failure (HCC) 03/07/2020   Type 2 diabetes mellitus with diabetic polyneuropathy, with long-term current use of insulin  (HCC) 03/07/2020   Obesity, Class III, BMI 40-49.9 (morbid obesity) 03/07/2020   Common bile duct (CBD) obstruction 05/28/2019   Benign neoplasm of ascending colon    Benign neoplasm of transverse colon    Benign neoplasm of descending colon    Benign neoplasm of sigmoid colon    Gastric polyps    Hyperkalemia 03/11/2019   Prolonged QT interval 03/11/2019   Acute blood loss anemia 03/11/2019   Onychomycosis 06/21/2018   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)    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 (HCC)    PVD (peripheral vascular disease) (HCC) 10/26/2017   E-coli UTI 07/27/2017   Hypothyroidism 07/27/2017   AKI (acute kidney injury) (HCC)    PAH (pulmonary artery hypertension) (HCC)    Impaired ambulation 07/19/2017   Leg cramps 02/27/2017   Peripheral edema 01/12/2017   Diabetic neuropathy (HCC)  11/12/2016   Anemia of chronic disease 10/03/2015   Historical diagnosis of generalized anxiety disorder 10/03/2015   Secondary insomnia 10/03/2015   Acute bronchitis 09/05/2015   Hyperglycemia due to diabetes mellitus (HCC) 06/07/2015   Non compliance with medical treatment 04/17/2014   Rotator cuff tear 03/14/2014   Obesity 09/23/2013   Acute on chronic HFrEF (heart failure with reduced ejection fraction) (HCC) 06/03/2013   Hypotension 12/25/2012   Hypokalemia 12/25/2012   Hyponatremia 12/25/2012   Urinary incontinence    MDD (major depressive disorder), recurrent episode, moderate (HCC) 11/12/2010   RBBB (right bundle branch block)    Wide-complex tachycardia    Coronary artery disease    Hyperlipemia 01/22/2009   Chronic hypotension 01/22/2009   Past Medical History:  Diagnosis Date   Allergy    Anemia    hx   Anxiety    Arthritis    generalized (03/15/2014)   CAD (coronary artery disease)    MI in 2000 - MI  2007 - treated bare metal stent (no nuclear since then as 9/11)   Carotid artery disease (HCC)    CHF (congestive heart failure) (HCC)    Chronic diastolic heart failure (HCC)    a) ECHO (08/2013) EF 55-60% and RV function nl b) RHC (08/2013) RA 4, RV 30/5/7, PA 25/10 (16), PCWP 7, Fick CO/CI 6.3/2.7, PVR 1.5 WU, PA 61 and 66%   Clotting disorder (HCC)    Daily headache    ~ every other day; since I fell in June (03/15/2014)   Depression    Diabetic retinopathy (HCC)    Dyslipidemia    ESRD (end stage renal disease) (HCC)    Dialysis on Tues Thurs Sat   Exertional shortness of breath    GERD (gastroesophageal reflux disease)    History of blood transfusion    History of kidney stones    HTN (hypertension)    Hypothyroidism    Myocardial infarction (HCC)    Obesity    Osteoarthritis    Oxygen  deficiency    PAF (paroxysmal atrial fibrillation) (HCC)    Peripheral neuropathy    bilateral feet/hands   PONV (postoperative nausea and vomiting)    RBBB  (right bundle branch block)    Old   Stroke (HCC)    mini strokes   Type II diabetes mellitus (HCC)    Type II, [place] libre left upper arm. patient has omnipod insulin   pump with Novolin R Insulin     Family History  Problem Relation Age of Onset   Heart attack Mother 30    Past Surgical History:  Procedure Laterality Date   A/V FISTULAGRAM Left 11/07/2022   Procedure: A/V Fistulagram;  Surgeon: Magda Debby SAILOR, MD;  Location: [facility];  Service: Cardiovascular;  Laterality: Left;   ABDOMINAL HYSTERECTOMY  1980's   AMPUTATION Right 02/24/2018   Procedure: RIGHT FOOT GREAT TOE AND 2ND TOE AMPUTATION;  Surgeon: Harden Jerona GAILS, MD;  Location: [facility] OR;  Service: Orthopedics;  Laterality: Right;   AMPUTATION Right 04/30/2018   Procedure: RIGHT TRANSMETATARSAL AMPUTATION;  Surgeon: Harden Jerona GAILS, MD;  Location: [facility] OR;  Service: Orthopedics;  Laterality: Right;   AMPUTATION Right 05/02/2022   Procedure: RIGHT BELOW KNEE AMPUTATION;  Surgeon: Harden Jerona GAILS, MD;  Location: [facility] OR;  Service: Orthopedics;  Laterality: Right;   APPLICATION OF WOUND VAC Right 06/13/2022   Procedure: APPLICATION OF WOUND VAC;  Surgeon: Harden Jerona GAILS, MD;  Location: [facility] OR;  Service: Orthopedics;  Laterality: Right;   APPLICATION OF WOUND VAC Left 11/14/2022   Procedure: APPLICATION OF WOUND VAC;  Surgeon: Harden Jerona GAILS, MD;  Location: [facility] OR;  Service: Orthopedics;  Laterality: Left;   AV FISTULA PLACEMENT Left 04/02/2022   Procedure: LEFT ARM ARTERIOVENOUS (AV) FISTULA CREATION;  Surgeon: Serene Gaile ORN, MD;  Location: [facility] OR;  Service: Vascular;  Laterality: Left;  PERIPHERAL NERVE BLOCK   AV FISTULA PLACEMENT Right 04/27/2023   Procedure: RIGHT ARM BRACHIOBASILIC ARTERIOVENOUS (AV) FISTULA CREATION;  Surgeon: Magda Debby SAILOR, MD;  Location: [facility] OR;  Service: Vascular;  Laterality: Right;   BASCILIC VEIN TRANSPOSITION Left 07/31/2022   Procedure: LEFT ARM SECOND STAGE BASILIC VEIN TRANSPOSITION;  Surgeon:  Serene Gaile ORN, MD;  Location: [facility] OR;  Service: Vascular;  Laterality: Left;   BIOPSY  05/27/2020   Procedure: BIOPSY;  Surgeon: Cindie Carlin POUR, DO;  Location: [facility];  Service: Endoscopy;;   CATARACT EXTRACTION, BILATERAL Bilateral ?2013   COLONOSCOPY W/ POLYPECTOMY     COLONOSCOPY WITH PROPOFOL  N/A 03/13/2019   Procedure: COLONOSCOPY WITH PROPOFOL ;  Surgeon: Albertus Gordy HERO, MD;  Location: [facility] ENDOSCOPY;  Service: Gastroenterology;  Laterality: N/A;   CORONARY ANGIOPLASTY WITH STENT PLACEMENT  1999; 2007   1 + 1   ERCP N/A 02/03/2022   Procedure: ENDOSCOPIC RETROGRADE CHOLANGIOPANCREATOGRAPHY (ERCP);  Surgeon: Rollin Dover, MD;  Location: [facility] ENDOSCOPY;  Service: Gastroenterology;  Laterality: N/A;   ESOPHAGOGASTRODUODENOSCOPY N/A 02/12/2023   Procedure: ESOPHAGOGASTRODUODENOSCOPY (EGD);  Surgeon: Rollin Dover, MD;  Location: [facility] ENDOSCOPY;  Service: Gastroenterology;  Laterality: N/A;   ESOPHAGOGASTRODUODENOSCOPY (EGD) WITH PROPOFOL  N/A 03/13/2019   Procedure: ESOPHAGOGASTRODUODENOSCOPY (EGD) WITH PROPOFOL ;  Surgeon: Albertus Gordy HERO, MD;  Location: [facility] ENDOSCOPY;  Service: Gastroenterology;  Laterality: N/A;   ESOPHAGOGASTRODUODENOSCOPY (EGD) WITH PROPOFOL  N/A 05/27/2020   Procedure: ESOPHAGOGASTRODUODENOSCOPY (EGD) WITH PROPOFOL ;  Surgeon: Cindie Carlin POUR, DO;  [facility];  Service: Endoscopy;  Laterality: N/A;   ESOPHAGOGASTRODUODENOSCOPY (EGD) WITH PROPOFOL  N/A 09/03/2022   Procedure: ESOPHAGOGASTRODUODENOSCOPY (EGD) WITH PROPOFOL ;  Surgeon: Rollin Dover, MD;  Location: [facility] ENDOSCOPY;  Service: Gastroenterology;  Laterality: N/A;   EYE SURGERY Bilateral    lazer   FLEXIBLE SIGMOIDOSCOPY N/A 05/23/2022   Procedure: FLEXIBLE SIGMOIDOSCOPY;  Surgeon: Rollin Dover, MD;  Location: [facility] ENDOSCOPY;  Service: Gastroenterology;  Laterality: N/A;   FLEXIBLE SIGMOIDOSCOPY N/A 05/24/2022   Procedure: FLEXIBLE SIGMOIDOSCOPY;  Surgeon: Federico Rosario BROCKS, MD;  Location: [facility]  ENDOSCOPY;   Service: Gastroenterology;  Laterality: N/A;   FLEXIBLE SIGMOIDOSCOPY N/A 09/03/2022   Procedure: FLEXIBLE SIGMOIDOSCOPY;  Surgeon: Rollin Dover, MD;  Location: [facility] ENDOSCOPY;  Service: Gastroenterology;  Laterality: N/A;   HEMOSTASIS CLIP PLACEMENT  03/13/2019   Procedure: HEMOSTASIS CLIP PLACEMENT;  Surgeon: Albertus Gordy HERO, MD;  Location: [facility] ENDOSCOPY;  Service: Gastroenterology;;   HEMOSTASIS CLIP PLACEMENT  05/23/2022   Procedure: HEMOSTASIS CLIP PLACEMENT;  Surgeon: Rollin Dover, MD;  Location: [facility] ENDOSCOPY;  Service: Gastroenterology;;   HEMOSTASIS CONTROL  05/24/2022   Procedure: HEMOSTASIS CONTROL;  Surgeon: Federico Rosario BROCKS, MD;  Location: [facility] ENDOSCOPY;  Service: Gastroenterology;;   HOT HEMOSTASIS N/A 05/23/2022   Procedure: HOT HEMOSTASIS (ARGON PLASMA COAGULATION/BICAP);  Surgeon: Rollin Dover, MD;  Location: [facility] ENDOSCOPY;  Service: Gastroenterology;  Laterality: N/A;   I & D EXTREMITY Left 05/05/2022   Procedure: IRRIGATION AND DEBRIDEMENT LEFT ARM AV FISTULA;  Surgeon: Gretta Lonni PARAS, MD;  Location: [facility] OR;  Service: Vascular;  Laterality: Left;   I & D EXTREMITY N/A 11/14/2022   Procedure: IRRIGATION AND DEBRIDEMENT OF LOWER EXTREMITY WOUND;  Surgeon: Harden Jerona GAILS, MD;  Location: [facility] OR;  Service: Orthopedics;  Laterality: N/A;   INSERTION OF DIALYSIS CATHETER Right 04/02/2022   Procedure: INSERTION OF TUNNELED DIALYSIS CATHETER;  Surgeon: Serene Gaile ORN, MD;  Location: [facility] OR;  Service: Vascular;  Laterality: Right;   IR FLUORO GUIDE CV LINE RIGHT  08/10/2023   IR VENOCAVAGRAM IVC  08/10/2023   KNEE ARTHROSCOPY Left 10/25/2006   POLYPECTOMY  03/13/2019   Procedure: POLYPECTOMY;  Surgeon: Albertus Gordy HERO, MD;  Location: [facility] ENDOSCOPY;  Service: Gastroenterology;;   REMOVAL OF STONES  02/03/2022   Procedure: REMOVAL OF STONES;  Surgeon: Rollin Dover, MD;  Location: [facility] ENDOSCOPY;  Service: Gastroenterology;;   REVISON OF ARTERIOVENOUS FISTULA Left 08/20/2022   Procedure: REVISON OF  LEFT ARM ARTERIOVENOUS FISTULA;  Surgeon: Serene Gaile ORN, MD;  Location[facility] OR;  Service: Vascular;  Laterality: Left;   RIGHT HEART CATH N/A 07/24/2017   Procedure: RIGHT HEART CATH;  Surgeon: Cherrie Toribio SAUNDERS, MD;  [facility]: [facility];  Service: Cardiovascular;  Laterality: N/A;   RIGHT HEART CATHETERIZATION N/A 09/22/2013   Procedure: RIGHT HEART CATH;  Surgeon: Toribio SAUNDERS Cherrie, MD;  Location: [facility] CATH LAB;  Service: Cardiovascular;  Laterality: N/A;   SHOULDER ARTHROSCOPY WITH OPEN ROTATOR CUFF REPAIR Right 03/14/2014   Procedure: RIGHT SHOULDER ARTHROSCOPY WITH BICEPS RELEASE, OPEN SUBSCAPULA REPAIR, OPEN SUPRASPINATUS REPAIR.;  Surgeon: Cordella Glendia Hutchinson, MD;  Location: [facility] OR;  Service: Orthopedics;  Laterality: Right;   SPHINCTEROTOMY  02/03/2022   Procedure: SPHINCTEROTOMY;  Surgeon: Rollin Dover, MD;  Location: [facility] ENDOSCOPY;  Service: Gastroenterology;;   STUMP REVISION Right 06/13/2022   Procedure: REVISION RIGHT BELOW KNEE AMPUTATION;  Surgeon: Harden Jerona GAILS, MD;  Location: [facility] OR;  Service: Orthopedics;  Laterality: Right;   TEE WITHOUT CARDIOVERSION N/A 02/04/2022   Procedure: TRANSESOPHAGEAL ECHOCARDIOGRAM (TEE);  Surgeon: Cherrie Toribio SAUNDERS, MD;  Location: [facility] ENDOSCOPY;  Service: Cardiovascular;  Laterality: N/A;   THROMBECTOMY W/ EMBOLECTOMY Left 08/20/2022   Procedure: THROMBECTOMY OF LEFT ARM ARTERIOVENOUS FISTULA;  Surgeon: Serene Gaile ORN, MD;  Location: [facility] OR;  Service: Vascular;  Laterality: Left;   TOE AMPUTATION Right 02/24/2018   GREAT TOE AND 2ND TOE AMPUTATION   TUBAL LIGATION  1970's   UPPER EXTREMITY VENOGRAPHY N/A 07/31/2023   Procedure: UPPER EXTREMITY VENOGRAPHY;  Surgeon: Magda Debby SAILOR, MD;  Location: [facility] INVASIVE  CV LAB;  Service: Cardiovascular;  Laterality: N/A;   Social History   Occupational History    Employer: UNEMPLOYED  Tobacco Use   Smoking status: Former    Current packs/day: 0.00    Average packs/day: 3.0 packs/day for 32.0 years  (96.0 ttl pk-yrs)    Types: Cigarettes    Start date: 10/24/1965    Quit date: 10/24/1997    Years since quitting: 26.1   Smokeless tobacco: Never  Vaping Use   Vaping status: Never Used  Substance and Sexual Activity   Alcohol  use: Not Currently   Drug use: No   Sexual activity: Not Currently    Birth control/protection: Surgical    Comment: Hysterectomy

## 2023-12-29 ENCOUNTER — Other Ambulatory Visit: Payer: Self-pay

## 2023-12-29 ENCOUNTER — Inpatient Hospital Stay (HOSPITAL_COMMUNITY): Admission: EM | Admit: 2023-12-29 | Discharge: 2023-12-30 | DRG: 871 | Disposition: A | Discharge status: Hospice

## 2023-12-29 ENCOUNTER — Encounter (HOSPITAL_COMMUNITY): Payer: Self-pay | Admitting: Emergency Medicine

## 2023-12-29 ENCOUNTER — Emergency Department (HOSPITAL_COMMUNITY)

## 2023-12-29 DIAGNOSIS — Z794 Long term (current) use of insulin: Secondary | ICD-10-CM

## 2023-12-29 DIAGNOSIS — I872 Venous insufficiency (chronic) (peripheral): Secondary | ICD-10-CM | POA: Diagnosis present

## 2023-12-29 DIAGNOSIS — Z993 Dependence on wheelchair: Secondary | ICD-10-CM

## 2023-12-29 DIAGNOSIS — E1122 Type 2 diabetes mellitus with diabetic chronic kidney disease: Secondary | ICD-10-CM | POA: Diagnosis present

## 2023-12-29 DIAGNOSIS — Z452 Encounter for adjustment and management of vascular access device: Secondary | ICD-10-CM | POA: Diagnosis not present

## 2023-12-29 DIAGNOSIS — I9589 Other hypotension: Secondary | ICD-10-CM | POA: Diagnosis present

## 2023-12-29 DIAGNOSIS — I252 Old myocardial infarction: Secondary | ICD-10-CM

## 2023-12-29 DIAGNOSIS — Z7901 Long term (current) use of anticoagulants: Secondary | ICD-10-CM

## 2023-12-29 DIAGNOSIS — Z955 Presence of coronary angioplasty implant and graft: Secondary | ICD-10-CM

## 2023-12-29 DIAGNOSIS — S31000S Unspecified open wound of lower back and pelvis without penetration into retroperitoneum, sequela: Secondary | ICD-10-CM

## 2023-12-29 DIAGNOSIS — E11319 Type 2 diabetes mellitus with unspecified diabetic retinopathy without macular edema: Secondary | ICD-10-CM | POA: Diagnosis present

## 2023-12-29 DIAGNOSIS — R6521 Severe sepsis with septic shock: Secondary | ICD-10-CM | POA: Diagnosis present

## 2023-12-29 DIAGNOSIS — N2581 Secondary hyperparathyroidism of renal origin: Secondary | ICD-10-CM | POA: Diagnosis present

## 2023-12-29 DIAGNOSIS — E871 Hypo-osmolality and hyponatremia: Secondary | ICD-10-CM | POA: Diagnosis not present

## 2023-12-29 DIAGNOSIS — I480 Paroxysmal atrial fibrillation: Secondary | ICD-10-CM | POA: Diagnosis not present

## 2023-12-29 DIAGNOSIS — Z992 Dependence on renal dialysis: Secondary | ICD-10-CM

## 2023-12-29 DIAGNOSIS — E872 Acidosis, unspecified: Secondary | ICD-10-CM | POA: Diagnosis present

## 2023-12-29 DIAGNOSIS — D631 Anemia in chronic kidney disease: Secondary | ICD-10-CM | POA: Diagnosis present

## 2023-12-29 DIAGNOSIS — Z87891 Personal history of nicotine dependence: Secondary | ICD-10-CM

## 2023-12-29 DIAGNOSIS — L89150 Pressure ulcer of sacral region, unstageable: Secondary | ICD-10-CM | POA: Diagnosis present

## 2023-12-29 DIAGNOSIS — F419 Anxiety disorder, unspecified: Secondary | ICD-10-CM | POA: Diagnosis present

## 2023-12-29 DIAGNOSIS — Z881 Allergy status to other antibiotic agents status: Secondary | ICD-10-CM

## 2023-12-29 DIAGNOSIS — R609 Edema, unspecified: Secondary | ICD-10-CM | POA: Diagnosis not present

## 2023-12-29 DIAGNOSIS — E785 Hyperlipidemia, unspecified: Secondary | ICD-10-CM | POA: Diagnosis present

## 2023-12-29 DIAGNOSIS — I2510 Atherosclerotic heart disease of native coronary artery without angina pectoris: Secondary | ICD-10-CM | POA: Diagnosis present

## 2023-12-29 DIAGNOSIS — Z79899 Other long term (current) drug therapy: Secondary | ICD-10-CM

## 2023-12-29 DIAGNOSIS — I5042 Chronic combined systolic (congestive) and diastolic (congestive) heart failure: Secondary | ICD-10-CM | POA: Diagnosis present

## 2023-12-29 DIAGNOSIS — R000 Tachycardia, unspecified: Secondary | ICD-10-CM | POA: Diagnosis not present

## 2023-12-29 DIAGNOSIS — R404 Transient alteration of awareness: Secondary | ICD-10-CM | POA: Diagnosis not present

## 2023-12-29 DIAGNOSIS — Z8249 Family history of ischemic heart disease and other diseases of the circulatory system: Secondary | ICD-10-CM

## 2023-12-29 DIAGNOSIS — Z9842 Cataract extraction status, left eye: Secondary | ICD-10-CM

## 2023-12-29 DIAGNOSIS — R410 Disorientation, unspecified: Secondary | ICD-10-CM | POA: Diagnosis not present

## 2023-12-29 DIAGNOSIS — B964 Proteus (mirabilis) (morganii) as the cause of diseases classified elsewhere: Secondary | ICD-10-CM | POA: Diagnosis present

## 2023-12-29 DIAGNOSIS — N390 Urinary tract infection, site not specified: Secondary | ICD-10-CM | POA: Diagnosis present

## 2023-12-29 DIAGNOSIS — E039 Hypothyroidism, unspecified: Secondary | ICD-10-CM | POA: Diagnosis not present

## 2023-12-29 DIAGNOSIS — R4182 Altered mental status, unspecified: Secondary | ICD-10-CM | POA: Diagnosis not present

## 2023-12-29 DIAGNOSIS — Z66 Do not resuscitate: Secondary | ICD-10-CM | POA: Diagnosis present

## 2023-12-29 DIAGNOSIS — I132 Hypertensive heart and chronic kidney disease with heart failure and with stage 5 chronic kidney disease, or end stage renal disease: Secondary | ICD-10-CM | POA: Diagnosis present

## 2023-12-29 DIAGNOSIS — Z89511 Acquired absence of right leg below knee: Secondary | ICD-10-CM

## 2023-12-29 DIAGNOSIS — R739 Hyperglycemia, unspecified: Secondary | ICD-10-CM | POA: Diagnosis not present

## 2023-12-29 DIAGNOSIS — Z9841 Cataract extraction status, right eye: Secondary | ICD-10-CM

## 2023-12-29 DIAGNOSIS — Z87442 Personal history of urinary calculi: Secondary | ICD-10-CM

## 2023-12-29 DIAGNOSIS — F32A Depression, unspecified: Secondary | ICD-10-CM | POA: Diagnosis present

## 2023-12-29 DIAGNOSIS — L97929 Non-pressure chronic ulcer of unspecified part of left lower leg with unspecified severity: Secondary | ICD-10-CM | POA: Diagnosis not present

## 2023-12-29 DIAGNOSIS — R918 Other nonspecific abnormal finding of lung field: Secondary | ICD-10-CM | POA: Diagnosis not present

## 2023-12-29 DIAGNOSIS — Z7189 Other specified counseling: Secondary | ICD-10-CM | POA: Diagnosis not present

## 2023-12-29 DIAGNOSIS — Z8673 Personal history of transient ischemic attack (TIA), and cerebral infarction without residual deficits: Secondary | ICD-10-CM

## 2023-12-29 DIAGNOSIS — A419 Sepsis, unspecified organism: Secondary | ICD-10-CM | POA: Diagnosis not present

## 2023-12-29 DIAGNOSIS — Z6832 Body mass index (BMI) 32.0-32.9, adult: Secondary | ICD-10-CM

## 2023-12-29 DIAGNOSIS — E669 Obesity, unspecified: Secondary | ICD-10-CM | POA: Diagnosis present

## 2023-12-29 DIAGNOSIS — G253 Myoclonus: Secondary | ICD-10-CM | POA: Diagnosis present

## 2023-12-29 DIAGNOSIS — N250 Renal osteodystrophy: Secondary | ICD-10-CM | POA: Diagnosis not present

## 2023-12-29 DIAGNOSIS — I10 Essential (primary) hypertension: Secondary | ICD-10-CM | POA: Diagnosis not present

## 2023-12-29 DIAGNOSIS — I959 Hypotension, unspecified: Secondary | ICD-10-CM | POA: Diagnosis not present

## 2023-12-29 DIAGNOSIS — L89159 Pressure ulcer of sacral region, unspecified stage: Secondary | ICD-10-CM | POA: Diagnosis not present

## 2023-12-29 DIAGNOSIS — R0902 Hypoxemia: Secondary | ICD-10-CM | POA: Diagnosis not present

## 2023-12-29 DIAGNOSIS — Z885 Allergy status to narcotic agent status: Secondary | ICD-10-CM

## 2023-12-29 DIAGNOSIS — G9341 Metabolic encephalopathy: Secondary | ICD-10-CM | POA: Diagnosis present

## 2023-12-29 DIAGNOSIS — N186 End stage renal disease: Secondary | ICD-10-CM | POA: Diagnosis present

## 2023-12-29 DIAGNOSIS — Z8619 Personal history of other infectious and parasitic diseases: Secondary | ICD-10-CM

## 2023-12-29 DIAGNOSIS — Z79890 Hormone replacement therapy (postmenopausal): Secondary | ICD-10-CM

## 2023-12-29 DIAGNOSIS — R0603 Acute respiratory distress: Secondary | ICD-10-CM | POA: Diagnosis not present

## 2023-12-29 DIAGNOSIS — Z743 Need for continuous supervision: Secondary | ICD-10-CM | POA: Diagnosis not present

## 2023-12-29 DIAGNOSIS — K219 Gastro-esophageal reflux disease without esophagitis: Secondary | ICD-10-CM | POA: Diagnosis present

## 2023-12-29 DIAGNOSIS — Z90710 Acquired absence of both cervix and uterus: Secondary | ICD-10-CM

## 2023-12-29 DIAGNOSIS — I96 Gangrene, not elsewhere classified: Secondary | ICD-10-CM | POA: Diagnosis present

## 2023-12-29 DIAGNOSIS — E860 Dehydration: Secondary | ICD-10-CM | POA: Diagnosis present

## 2023-12-29 DIAGNOSIS — E1142 Type 2 diabetes mellitus with diabetic polyneuropathy: Secondary | ICD-10-CM | POA: Diagnosis present

## 2023-12-29 DIAGNOSIS — Z9641 Presence of insulin pump (external) (internal): Secondary | ICD-10-CM | POA: Diagnosis present

## 2023-12-29 DIAGNOSIS — I120 Hypertensive chronic kidney disease with stage 5 chronic kidney disease or end stage renal disease: Secondary | ICD-10-CM | POA: Diagnosis not present

## 2023-12-29 DIAGNOSIS — Z515 Encounter for palliative care: Secondary | ICD-10-CM

## 2023-12-29 LAB — CBC WITH DIFFERENTIAL/PLATELET
Abs Immature Granulocytes: 0.21 K/uL — ABNORMAL HIGH (ref 0.00–0.07)
Basophils Absolute: 0 K/uL (ref 0.0–0.1)
Basophils Relative: 0 %
Eosinophils Absolute: 0.2 K/uL (ref 0.0–0.5)
Eosinophils Relative: 1 %
HCT: 32.9 % — ABNORMAL LOW (ref 36.0–46.0)
Hemoglobin: 9.9 g/dL — ABNORMAL LOW (ref 12.0–15.0)
Immature Granulocytes: 1 %
Lymphocytes Relative: 2 %
Lymphs Abs: 0.5 K/uL — ABNORMAL LOW (ref 0.7–4.0)
MCH: 26.5 pg (ref 26.0–34.0)
MCHC: 30.1 g/dL (ref 30.0–36.0)
MCV: 88 fL (ref 80.0–100.0)
Monocytes Absolute: 1.1 K/uL — ABNORMAL HIGH (ref 0.1–1.0)
Monocytes Relative: 5 %
Neutro Abs: 21.7 K/uL — ABNORMAL HIGH (ref 1.7–7.7)
Neutrophils Relative %: 91 %
Platelets: 281 K/uL (ref 150–400)
RBC: 3.74 MIL/uL — ABNORMAL LOW (ref 3.87–5.11)
RDW: 18.1 % — ABNORMAL HIGH (ref 11.5–15.5)
WBC: 23.8 K/uL — ABNORMAL HIGH (ref 4.0–10.5)
nRBC: 0 % (ref 0.0–0.2)

## 2023-12-29 LAB — BLOOD GAS, VENOUS
Acid-base deficit: 1.7 mmol/L (ref 0.0–2.0)
Bicarbonate: 25.1 mmol/L (ref 20.0–28.0)
Drawn by: 49564
O2 Saturation: 35.7 %
Patient temperature: 36.9
pCO2, Ven: 51 mmHg (ref 44–60)
pH, Ven: 7.3 (ref 7.25–7.43)
pO2, Ven: <31 mmHg — CL (ref 32–45)

## 2023-12-29 LAB — BASIC METABOLIC PANEL WITH GFR
Anion gap: 17 — ABNORMAL HIGH (ref 5–15)
Anion gap: 20 — ABNORMAL HIGH (ref 5–15)
BUN: 58 mg/dL — ABNORMAL HIGH (ref 8–23)
BUN: 58 mg/dL — ABNORMAL HIGH (ref 8–23)
CO2: 20 mmol/L — ABNORMAL LOW (ref 22–32)
CO2: 18 mmol/L — ABNORMAL LOW (ref 22–32)
Calcium: 8.5 mg/dL — ABNORMAL LOW (ref 8.9–10.3)
Calcium: 8.4 mg/dL — ABNORMAL LOW (ref 8.9–10.3)
Chloride: 93 mmol/L — ABNORMAL LOW (ref 98–111)
Chloride: 93 mmol/L — ABNORMAL LOW (ref 98–111)
Creatinine, Ser: 6 mg/dL — ABNORMAL HIGH (ref 0.44–1.00)
Creatinine, Ser: 5.99 mg/dL — ABNORMAL HIGH (ref 0.44–1.00)
GFR, Estimated: 7 mL/min — ABNORMAL LOW (ref >60)
GFR, Estimated: 7 mL/min — ABNORMAL LOW (ref >60)
Glucose, Bld: 223 mg/dL — ABNORMAL HIGH (ref 70–99)
Glucose, Bld: 122 mg/dL — ABNORMAL HIGH (ref 70–99)
Potassium: 3.8 mmol/L (ref 3.5–5.1)
Potassium: 4.2 mmol/L (ref 3.5–5.1)
Sodium: 130 mmol/L — ABNORMAL LOW (ref 135–145)
Sodium: 131 mmol/L — ABNORMAL LOW (ref 135–145)

## 2023-12-29 LAB — URINALYSIS, W/ REFLEX TO CULTURE (INFECTION SUSPECTED)
Bacteria, UA: NONE SEEN
Bilirubin Urine: NEGATIVE
Glucose, UA: 150 mg/dL — AB
Ketones, ur: NEGATIVE mg/dL
Nitrite: NEGATIVE
Protein, ur: >=300 mg/dL — AB
RBC / HPF: >50 RBC/hpf (ref 0–5)
Specific Gravity, Urine: 1.018 (ref 1.005–1.030)
WBC, UA: >50 WBC/hpf (ref 0–5)
pH: 6 (ref 5.0–8.0)

## 2023-12-29 LAB — COMPREHENSIVE METABOLIC PANEL WITH GFR
ALT: 12 U/L (ref 0–44)
AST: 21 U/L (ref 15–41)
Albumin: 2.2 g/dL — ABNORMAL LOW (ref 3.5–5.0)
Alkaline Phosphatase: 139 U/L — ABNORMAL HIGH (ref 38–126)
Anion gap: 18 — ABNORMAL HIGH (ref 5–15)
BUN: 61 mg/dL — ABNORMAL HIGH (ref 8–23)
CO2: 22 mmol/L (ref 22–32)
Calcium: 8.6 mg/dL — ABNORMAL LOW (ref 8.9–10.3)
Chloride: 92 mmol/L — ABNORMAL LOW (ref 98–111)
Creatinine, Ser: 5.94 mg/dL — ABNORMAL HIGH (ref 0.44–1.00)
GFR, Estimated: 7 mL/min — ABNORMAL LOW (ref >60)
Glucose, Bld: 251 mg/dL — ABNORMAL HIGH (ref 70–99)
Potassium: 3.8 mmol/L (ref 3.5–5.1)
Sodium: 132 mmol/L — ABNORMAL LOW (ref 135–145)
Total Bilirubin: 0.7 mg/dL (ref 0.0–1.2)
Total Protein: 6 g/dL — ABNORMAL LOW (ref 6.5–8.1)

## 2023-12-29 LAB — PROTIME-INR
INR: 1.9 — ABNORMAL HIGH (ref 0.8–1.2)
Prothrombin Time: 22.8 s — ABNORMAL HIGH (ref 11.4–15.2)

## 2023-12-29 LAB — BETA-HYDROXYBUTYRIC ACID
Beta-Hydroxybutyric Acid: 0.57 mmol/L — ABNORMAL HIGH (ref 0.05–0.27)
Beta-Hydroxybutyric Acid: 0.1 mmol/L (ref 0.05–0.27)

## 2023-12-29 LAB — LACTIC ACID, PLASMA
Lactic Acid, Venous: 2.2 mmol/L (ref 0.5–1.9)
Lactic Acid, Venous: 2.1 mmol/L (ref 0.5–1.9)

## 2023-12-29 LAB — RESP PANEL BY RT-PCR (RSV, FLU A&B, COVID)  RVPGX2
Influenza A by PCR: NEGATIVE
Influenza B by PCR: NEGATIVE
Resp Syncytial Virus by PCR: NEGATIVE
SARS Coronavirus 2 by RT PCR: NEGATIVE

## 2023-12-29 LAB — PROCALCITONIN: Procalcitonin: 2.34 ng/mL

## 2023-12-29 LAB — GLUCOSE, CAPILLARY: Glucose-Capillary: 92 mg/dL (ref 70–99)

## 2023-12-29 LAB — MRSA NEXT GEN BY PCR, NASAL: MRSA by PCR Next Gen: NOT DETECTED

## 2023-12-29 LAB — CORTISOL: Cortisol, Plasma: 27.1 ug/dL

## 2023-12-29 LAB — CBG MONITORING, ED: Glucose-Capillary: 254 mg/dL — ABNORMAL HIGH (ref 70–99)

## 2023-12-29 MED ORDER — ALBUTEROL SULFATE (2.5 MG/3ML) 0.083% IN NEBU: 2.5000 mg | INHALATION_SOLUTION | Freq: Four times a day (QID) | RESPIRATORY_TRACT | Status: DC | PRN

## 2023-12-29 MED ORDER — SEVELAMER CARBONATE 800 MG PO TABS
1600.0000 mg | ORAL_TABLET | Freq: Three times a day (TID) | ORAL | Status: DC
  Filled 2023-12-29: qty 2

## 2023-12-29 MED ORDER — ACETAMINOPHEN 650 MG RE SUPP
650.0000 mg | Freq: Once | RECTAL | Status: AC
  Administered 2023-12-29: 650 mg via RECTAL
  Filled 2023-12-29: qty 1

## 2023-12-29 MED ORDER — INSULIN ASPART 100 UNIT/ML IJ SOLN
0.0000 [IU] | INTRAMUSCULAR | Status: DC
  Administered 2023-12-29: 4 [IU] via SUBCUTANEOUS
  Administered 2023-12-29: 7 [IU] via SUBCUTANEOUS
  Administered 2023-12-30: 3 [IU] via SUBCUTANEOUS

## 2023-12-29 MED ORDER — VANCOMYCIN HCL IN DEXTROSE 1-5 GM/200ML-% IV SOLN: 1000.0000 mg | INTRAVENOUS | Status: DC

## 2023-12-29 MED ORDER — SODIUM CHLORIDE 0.9 % IV SOLN
1.0000 g | Freq: Every day | INTRAVENOUS | Status: DC
  Administered 2023-12-29: 1 g via INTRAVENOUS
  Filled 2023-12-29: qty 20

## 2023-12-29 MED ORDER — METRONIDAZOLE 500 MG/100ML IV SOLN: 500.0000 mg | Freq: Once | INTRAVENOUS | Status: DC

## 2023-12-29 MED ORDER — SODIUM CHLORIDE 0.9 % IV SOLN: 250.0000 mL | INTRAVENOUS | Status: DC

## 2023-12-29 MED ORDER — PANTOPRAZOLE SODIUM 40 MG PO TBEC: 40.0000 mg | DELAYED_RELEASE_TABLET | Freq: Every day | ORAL | Status: DC

## 2023-12-29 MED ORDER — CHLORHEXIDINE GLUCONATE CLOTH 2 % EX PADS: 6.0000 | MEDICATED_PAD | Freq: Every day | CUTANEOUS | Status: DC

## 2023-12-29 MED ORDER — COLLAGENASE 250 UNIT/GM EX OINT
TOPICAL_OINTMENT | Freq: Every day | CUTANEOUS | Status: DC
  Filled 2023-12-29: qty 30

## 2023-12-29 MED ORDER — SODIUM CHLORIDE 0.9 % IV SOLN
2.0000 g | Freq: Once | INTRAVENOUS | Status: AC
  Administered 2023-12-29: 2 g via INTRAVENOUS
  Filled 2023-12-29: qty 20

## 2023-12-29 MED ORDER — SODIUM CHLORIDE 0.9 % IV BOLUS
2500.0000 mL | Freq: Once | INTRAVENOUS | Status: AC
  Administered 2023-12-29: 2500 mL via INTRAVENOUS

## 2023-12-29 MED ORDER — LEVOTHYROXINE SODIUM 50 MCG PO TABS
50.0000 ug | ORAL_TABLET | Freq: Every day | ORAL | Status: DC
  Filled 2023-12-29: qty 1

## 2023-12-29 MED ORDER — CHLORHEXIDINE GLUCONATE CLOTH 2 % EX PADS
6.0000 | MEDICATED_PAD | Freq: Every day | CUTANEOUS | Status: DC
  Administered 2023-12-29: 6 via TOPICAL

## 2023-12-29 MED ORDER — FOLIC ACID 1 MG PO TABS
1.0000 mg | ORAL_TABLET | Freq: Every day | ORAL | Status: DC
  Filled 2023-12-29: qty 1

## 2023-12-29 MED ORDER — NOREPINEPHRINE 4 MG/250ML-% IV SOLN
0.0000 ug/min | INTRAVENOUS | Status: DC
  Administered 2023-12-29: 5 ug/min via INTRAVENOUS
  Filled 2023-12-29: qty 250

## 2023-12-29 MED ORDER — POLYETHYLENE GLYCOL 3350 17 G PO PACK
17.0000 g | PACK | Freq: Every day | ORAL | Status: DC | PRN
Start: 2023-12-29 — End: 2023-12-30

## 2023-12-29 MED ORDER — ATORVASTATIN CALCIUM 80 MG PO TABS
80.0000 mg | ORAL_TABLET | Freq: Every day | ORAL | Status: DC
  Filled 2023-12-29: qty 1

## 2023-12-29 MED ORDER — POLYETHYLENE GLYCOL 3350 17 G PO PACK: 17.0000 g | PACK | Freq: Every day | ORAL | Status: DC

## 2023-12-29 MED ORDER — MIDODRINE HCL 5 MG PO TABS
10.0000 mg | ORAL_TABLET | Freq: Three times a day (TID) | ORAL | Status: DC
  Filled 2023-12-29: qty 2

## 2023-12-29 MED ORDER — METRONIDAZOLE 500 MG/100ML IV SOLN
500.0000 mg | Freq: Once | INTRAVENOUS | Status: AC
  Administered 2023-12-29: 500 mg via INTRAVENOUS
  Filled 2023-12-29: qty 100

## 2023-12-29 MED ORDER — LACTATED RINGERS IV SOLN: INTRAVENOUS | Status: DC

## 2023-12-29 MED ORDER — DAKINS (1/4 STRENGTH) 0.125 % EX SOLN
Freq: Two times a day (BID) | CUTANEOUS | Status: DC
  Filled 2023-12-29: qty 473

## 2023-12-29 MED ORDER — CALCITRIOL 0.5 MCG PO CAPS: 2.2500 ug | ORAL_CAPSULE | ORAL | Status: DC

## 2023-12-29 MED ORDER — LACTATED RINGERS IV BOLUS
500.0000 mL | Freq: Once | INTRAVENOUS | Status: AC
  Administered 2023-12-29: 500 mL via INTRAVENOUS

## 2023-12-29 MED ORDER — SODIUM CHLORIDE 0.9 % IV SOLN: 250.0000 mL | INTRAVENOUS | Status: AC

## 2023-12-29 MED ORDER — NOREPINEPHRINE 16 MG/250ML-% IV SOLN
0.0000 ug/min | INTRAVENOUS | Status: DC
  Administered 2023-12-29: 2 ug/min via INTRAVENOUS
  Filled 2023-12-29: qty 250

## 2023-12-29 MED ORDER — ZINC OXIDE 40 % EX OINT
TOPICAL_OINTMENT | Freq: Two times a day (BID) | CUTANEOUS | Status: DC
  Filled 2023-12-29: qty 57

## 2023-12-29 MED ORDER — VANCOMYCIN HCL 1500 MG/300ML IV SOLN
1500.0000 mg | Freq: Once | INTRAVENOUS | Status: AC
  Filled 2023-12-29: qty 300

## 2023-12-29 MED ORDER — LACTATED RINGERS IV BOLUS (SEPSIS): 1000.0000 mL | Freq: Once | INTRAVENOUS | Status: DC

## 2023-12-29 MED ORDER — ACETAMINOPHEN 500 MG PO TABS: 1000.0000 mg | ORAL_TABLET | Freq: Four times a day (QID) | ORAL | Status: DC | PRN

## 2023-12-29 MED ORDER — FENTANYL CITRATE (PF) 100 MCG/2ML IJ SOLN
50.0000 ug | Freq: Once | INTRAMUSCULAR | Status: AC
  Administered 2023-12-29: 50 ug via INTRAVENOUS
  Filled 2023-12-29: qty 2

## 2023-12-29 MED ORDER — AMIODARONE HCL 200 MG PO TABS
200.0000 mg | ORAL_TABLET | Freq: Every day | ORAL | Status: DC
  Administered 2023-12-29: 200 mg via ORAL
  Filled 2023-12-29: qty 1

## 2023-12-29 MED ORDER — VANCOMYCIN HCL IN DEXTROSE 1-5 GM/200ML-% IV SOLN: 1000.0000 mg | Freq: Once | INTRAVENOUS | Status: DC

## 2023-12-29 MED ORDER — PANTOPRAZOLE SODIUM 40 MG PO TBEC
40.0000 mg | DELAYED_RELEASE_TABLET | Freq: Every day | ORAL | Status: DC
  Administered 2023-12-29: 40 mg via ORAL
  Filled 2023-12-29: qty 1

## 2023-12-29 MED ORDER — HEPARIN (PORCINE) 25000 UT/250ML-% IV SOLN
1700.0000 [IU]/h | INTRAVENOUS | Status: DC
  Administered 2023-12-29 – 2023-12-30 (×2): 1700 [IU]/h via INTRAVENOUS
  Filled 2023-12-29 (×2): qty 250

## 2023-12-29 MED ORDER — SEVELAMER CARBONATE 800 MG PO TABS: 800.0000 mg | ORAL_TABLET | ORAL | Status: DC

## 2023-12-29 MED ORDER — DOCUSATE SODIUM 100 MG PO CAPS: 100.0000 mg | ORAL_CAPSULE | Freq: Two times a day (BID) | ORAL | Status: DC | PRN

## 2023-12-29 MED ORDER — SEVELAMER CARBONATE 800 MG PO TABS: 800.0000 mg | ORAL_TABLET | ORAL | Status: DC | PRN

## 2023-12-29 MED ORDER — MIDODRINE HCL 5 MG PO TABS
5.0000 mg | ORAL_TABLET | Freq: Three times a day (TID) | ORAL | Status: DC
  Administered 2023-12-29: 5 mg via ORAL
  Filled 2023-12-29: qty 1

## 2023-12-29 NOTE — ED Notes (Signed)
 EDP  Dr.Zammit spoke to nephrology who states it is ok to use patients dialysis cath for levophed . Pts BP is 60/40 on a manual BP check.

## 2023-12-29 NOTE — Sepsis Progress Note (Signed)
 Elink monitoring for the code sepsis protocol.

## 2023-12-29 NOTE — Plan of Care (Signed)
  Problem: Clinical Measurements: Goal: Cardiovascular complication will be avoided Outcome: Progressing   Problem: Activity: Goal: Risk for activity intolerance will decrease Outcome: Progressing   Problem: Coping: Goal: Level of anxiety will decrease Outcome: Progressing   Problem: Elimination: Goal: Will not experience complications related to bowel motility Outcome: Progressing Goal: Will not experience complications related to urinary retention Outcome: Progressing

## 2023-12-29 NOTE — ED Triage Notes (Signed)
 Pt bib [facility] from home. Pts husband states pt has AMS. Was admitted x1 week ago for sepsis. Pt was hypotensive 74/44 and febrile w/ EMS.  Pt missed dialysis today. Pt A/O x2.

## 2023-12-29 NOTE — ED Provider Notes (Signed)
 [place] EMERGENCY DEPARTMENT AT [facility] Provider Note   CSN: [id number] Arrival date & time: 12/29/23  0945     Patient presents with: Hypotension   Susan [name] is a 73 y.o. female with history of ESRD on TThSat dialysis, diabetes, chronic hypotension, congestive heart failure, BKA, anemia presents the emergency department for evaluation of questionable altered mental status.  Patient is alert oriented to person, place, and time however does say some sentences of confusion.  Currently reports that her bottom is with causing her some pain.  No other complaints.  Denies any chest pain, belly pain, shortness of breath, or headache.  Per EMS, has reports that she was altered and brought her in to EMS.  She was discovered dialysis today however did not go.  She was hypotensive and febrile with EMS.  Level 5 caveat due to patient's status.  HPI     Prior to Admission medications   Medication Sig Start Date End Date Taking? Authorizing Provider  acetaminophen  (TYLENOL ) 500 MG tablet Take 1,000 mg by mouth every 6 (six) hours as needed for moderate pain (pain score 4-6), fever or headache.    [provider]  albuterol  (VENTOLIN  HFA) 108 (90 Base) MCG/ACT inhaler Inhale 1-2 puffs into the lungs every 6 (six) hours as needed for shortness of breath. 08/14/23   Duanne Butler DASEN, MD  amiodarone  (PACERONE ) 200 MG tablet Take 1 tablet (200 mg total) by mouth daily. 08/14/23   Duanne Butler DASEN, MD  apixaban  (ELIQUIS ) 5 MG TABS tablet Take 1 tablet (5 mg total) by mouth 2 (two) times daily. 08/14/23   Duanne Butler DASEN, MD  Ascorbic Acid  (VITAMIN C  PO) Take 1 tablet by mouth daily.    [provider]  atorvastatin  (LIPITOR ) 80 MG tablet Take 1 tablet (80 mg total) by mouth daily. 08/14/23   Duanne Butler DASEN, MD  B Complex-C-Folic Acid  (DIALYVITE TABLET) TABS Take 1 tablet by mouth daily.    [provider]  calcitRIOL  (ROCALTROL ) 0.25 MCG capsule Take 9  capsules (2.25 mcg total) by mouth Every Tuesday,Thursday,and Saturday with dialysis. Patient taking differently: Take 0.25 mcg by mouth daily. 08/15/23   Arlice Reichert, MD  clonazePAM  (KLONOPIN ) 0.5 MG disintegrating tablet Take 1 tablet (0.5 mg total) by mouth 2 (two) times daily. Stop lorazepam . 11/19/23   Duanne Butler DASEN, MD  folic acid  (FOLVITE ) 1 MG tablet TAKE 1 TABLET BY MOUTH EVERY DAY 12/15/23   Duanne Butler DASEN, MD  insulin  aspart (NOVOLOG ) 100 UNIT/ML injection Inject 100 Units into the skin at bedtime. Load insulin  pump with 100 units, use over night.    [provider]  Insulin  Disposable Pump (OMNIPOD 5 [id number] PODS GEN 5) MISC Inject 1 Application into the skin 3 days. 12/13/23   [provider]  insulin  glargine (LANTUS  SOLOSTAR) 100 UNIT/ML Solostar Pen Inject 40-44 Units into the skin at bedtime as needed (insulin  pump malfunction). Patient not taking: Reported on 12/18/2023    [provider]  lactulose  (CHRONULAC ) 10 GM/15ML solution Take 20 g by mouth daily as needed (constipation).    [provider]  levothyroxine  (SYNTHROID ) 50 MCG tablet Take 1 tablet (50 mcg total) by mouth daily before breakfast. 08/14/23   Duanne Butler DASEN, MD  MELATONIN PO Take 1 tablet by mouth at bedtime.    [provider]  methocarbamol  (ROBAXIN ) 750 MG tablet TAKE 1 TABLET (750 MG TOTAL) BY MOUTH EVERY 6 (SIX) HOURS AS NEEDED FOR MUSCLE SPASMS  12/01/23   Duanne Butler DASEN, MD  Methoxy PEG-Epoetin  Beta (MIRCERA IJ) Mircera 02/05/23 02/04/24  [provider]  midodrine  (PROAMATINE ) 10 MG tablet Take 1-2 tablets (10-20 mg total) by mouth Every Tuesday,Thursday,and Saturday with dialysis. 08/15/23   Duanne Butler DASEN, MD  Multiple Vitamins-Minerals (ZINC  PO) Take 1 tablet by mouth daily.    [provider]  nitroGLYCERIN  (NITROSTAT ) 0.4 MG SL tablet Place 1 tablet (0.4 mg total) under the tongue every 5 (five) minutes as needed. 08/14/23 01/30/24   Duanne Butler DASEN, MD  nystatin  cream (MYCOSTATIN ) Apply 1 Application topically 2 (two) times daily.    [provider]  ofloxacin (OCUFLOX) 0.3 % ophthalmic solution Place 1 drop into the left eye 4 (four) times daily. Patient not taking: Reported on 12/18/2023 12/13/23   [provider]  oxyCODONE -acetaminophen  (PERCOCET/ROXICET) 5-325 MG tablet Take 1 tablet by mouth every 6 (six) hours as needed for severe pain (pain score 7-10).    [provider]  OXYGEN  Inhale 2 L/min into the lungs at bedtime.    [provider]  pantoprazole  (PROTONIX ) 40 MG tablet Take 1 tablet (40 mg total) by mouth daily. 08/14/23   Duanne Butler DASEN, MD  polyethylene glycol (MIRALAX  / GLYCOLAX ) 17 g packet Take 17 g by mouth daily. Patient taking differently: Take 17 g by mouth daily as needed (constipation). 12/10/22   Angiulli, Daniel J, PA-C  prednisoLONE acetate (PRED FORTE) 1 % ophthalmic suspension 1 drop 4 (four) times daily. Patient not taking: Reported on 12/18/2023    [provider]  pregabalin  (LYRICA ) 150 MG capsule Take 150 mg by mouth 2 (two) times daily. 11/20/23   [provider]  sevelamer  carbonate (RENVELA ) 800 MG tablet Take 800-1,600 mg by mouth See admin instructions. Take 2 tablets (1600mg ) by mouth three times daily with meals and take 1 tablet (800mg ) daily as needed for snacks.    [provider]  venlafaxine  XR (EFFEXOR -XR) 75 MG 24 hr capsule Take 1 capsule (75 mg total) by mouth daily with breakfast. Stop trintellix  08/14/23   Duanne Butler DASEN, MD    Allergies: Codeine, Keflex  [cephalexin ], and Macrobid  [nitrofurantoin ]    Review of Systems  Unable to perform ROS: Mental status change  Respiratory:  Negative for shortness of breath.   Cardiovascular:  Negative for chest pain.  Gastrointestinal:  Negative for abdominal pain.  Skin:  Positive for wound.    Updated Vital Signs BP (!) 87/49   Pulse 79   Temp 98.7 F (37.1 C)    Resp 12   Ht 5' 5 (1.651 m)   Wt 88 kg   SpO2 100%   BMI 32.28 kg/m   Physical Exam Vitals and nursing note reviewed.  Constitutional:      Appearance: She is ill-appearing.  HENT:     Mouth/Throat:     Mouth: Mucous membranes are dry.     Comments: Significantly dry mucous membranes.  Lips are sticking to teeth.  She has dry cracked tongue. Eyes:     General: No scleral icterus. Cardiovascular:     Rate and Rhythm: Normal rate.  Pulmonary:     Effort: Pulmonary effort is normal. No respiratory distress.     Comments: Dialysis port in the right chest Abdominal:     Palpations: Abdomen is soft.     Tenderness: There is no abdominal tenderness.  Musculoskeletal:     Cervical back: Normal range of motion.     Comments: BKA on right.  Wrapped wounds on LLE.   Skin:    General: Skin is warm and dry.     Comments: Large necrotic sacral decubitus wounds seen.  Please see image.  Skin is cool to touch at the area with tunneling seen at the more inferior aspect of the wound.     (all labs ordered are listed, but only abnormal results are displayed) Labs Reviewed  COMPREHENSIVE METABOLIC PANEL WITH GFR - Abnormal; Notable for the following components:      Result Value   Sodium 132 (*)    Chloride 92 (*)    Glucose, Bld 251 (*)    BUN 61 (*)    Creatinine, Ser 5.94 (*)    Calcium  8.6 (*)    Total Protein 6.0 (*)    Albumin  2.2 (*)    Alkaline Phosphatase 139 (*)    GFR, Estimated 7 (*)    Anion gap 18 (*)    All other components within normal limits  LACTIC ACID, PLASMA - Abnormal; Notable for the following components:   Lactic Acid, Venous 2.2 (*)    All other components within normal limits  CBC WITH DIFFERENTIAL/PLATELET - Abnormal; Notable for the following components:   WBC 23.8 (*)    RBC 3.74 (*)    Hemoglobin 9.9 (*)    HCT 32.9 (*)    RDW 18.1 (*)    Neutro Abs 21.7 (*)    Lymphs Abs 0.5 (*)    Monocytes Absolute 1.1 (*)    Abs Immature Granulocytes  0.21 (*)    All other components within normal limits  URINALYSIS, W/ REFLEX TO CULTURE (INFECTION SUSPECTED) - Abnormal; Notable for the following components:   APPearance TURBID (*)    Glucose, UA 150 (*)    Hgb urine dipstick MODERATE (*)    Protein, ur >=300 (*)    Leukocytes,Ua MODERATE (*)    All other components within normal limits  CBG MONITORING, ED - Abnormal; Notable for the following components:   Glucose-Capillary 254 (*)    All other components within normal limits  CULTURE, BLOOD (ROUTINE X 2)  CULTURE, BLOOD (ROUTINE X 2)  RESP PANEL BY RT-PCR (RSV, FLU A&B, COVID)  RVPGX2  URINE CULTURE  LACTIC ACID, PLASMA  PROTIME-INR  I-STAT CHEM 8, ED    EKG: None  Radiology: [facility] Chest Port 1 View Result Date: 12/29/2023 CLINICAL DATA:  Altered mental status. Admitted 1 week ago for sepsis. Hypotensive. Missed dialysis today. Ex-smoker. EXAM: PORTABLE CHEST 1 VIEW COMPARISON:  12/17/2023. Chest, abdomen and pelvis CTA dated 08/05/2023. FINDINGS: Again demonstrated is a poor inspiration with elevation of the right hemidiaphragm. Stable borderline enlarged cardiac silhouette. Tortuous and partially calcified thoracic aorta. Stable right jugular double-lumen catheter with its tip in the region of the superior cavoatrial junction. Thoracic spine degenerative changes. IMPRESSION: 1. No acute abnormality. 2. Borderline cardiomegaly. Electronically Signed   By: Elspeth Bathe M.D.   On: 12/29/2023 11:37   .Critical Care  Performed by: Bernis Ernst, PA-C Authorized by: Bernis Ernst, PA-C   Critical care provider statement:    Critical care time (minutes):  90   Critical care was necessary to treat or prevent imminent or life-threatening deterioration of the following conditions:  Sepsis, circulatory failure, shock and dehydration   Critical care was time spent personally by me on the following activities:  Development of treatment plan with patient or surrogate, discussions with  consultants, evaluation of patient's response to treatment, examination of patient, ordering and review  of laboratory studies, ordering and review of radiographic studies, ordering and performing treatments and interventions, pulse oximetry, re-evaluation of patient's condition, review of old charts and obtaining history from patient or surrogate   Care discussed with: admitting provider      Medications Ordered in the ED  metroNIDAZOLE  (FLAGYL ) IVPB 500 mg (500 mg Intravenous New Bag/Given 12/29/23 1144)  vancomycin  (VANCOREADY) IVPB 1500 mg/300 mL (has no administration in time range)  sodium chloride  0.9 % bolus 2,500 mL (2,500 mLs Intravenous New Bag/Given 12/29/23 1047)  cefTRIAXone  (ROCEPHIN ) 2 g in sodium chloride  0.9 % 100 mL IVPB (2 g Intravenous New Bag/Given 12/29/23 1050)  acetaminophen  (TYLENOL ) suppository 650 mg (650 mg Rectal Given 12/29/23 1116)    Clinical Course as of 12/29/23 2136  Tue Dec 29, 2023  1028 Pat BP 94/76 MAP >65  [RR]  1124 In room, noticed fluids were paused. I have restarted her fluids at this time. Dr. Suzette would still liek to continue with fluids and no pressors at this time. IV Team in the room now for additional access.  [RR]  1225 Spoke with ICU physician, transfer to [facility] [RR]  1309 My attending spoke with Dr. Dalene with nephrology and was given clearance to used the dialysis access for the pressor. [RR]    Clinical Course User Index [RR] Bernis Ernst, PA-C   Medical Decision Making Amount and/or Complexity of Data Reviewed Labs: ordered. Radiology: ordered.  Risk OTC drugs. Prescription drug management. Decision regarding hospitalization.   73 y.o. female presents to the ER for evaluation of AMS/hypotension. Differential diagnosis includes but is not limited to Drug-related, hypoxia, hyper/hypoglycemia, encephalopathy, sepsis, DKA/HHS, brain lesion, CVA, seizure, environmental, psychiatric. Vital signs per tensive, febrile, otherwise  unremarkable. Physical exam as noted above.   Patient is ill-appearing however she is maintaining airway and answering questions appropriately however does have some sentences of confusion.  Question may be some hallucinations?  On previous chart evaluation, appears patient is chronically hypotensive and has been on midodrine .  It appears that her blood pressure is anywhere between 120s and 90 systolically.  Additionally, it appears that she was recently admitted for septic shock from unknown source but thought to be likely from either leg wound, sacral decubitus wound, or UTI. Patient's sacral decubitus wound does appear worsening appears into previous images.  Was recently seen by orthopedics yesterday and was told to apply Vashe with dressing changes and elevations.  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  The ulcer measures 9 x 5 cm with no purulence no drainage the wound is dry with large eschar.  My attending physician was immediately at bedside.  He placed orders for antibiotics and normal saline bolus at 2.5 L.  Patient was difficult IV access with multiple providers trying as well as with IV team.  Discussed central line with attending however does not feel that this is needed at this time.  Blood pressures has been more volatile but around maps in the 60s.  Discussed possible A-line with attending however does not feel this is needed at this time.  Sepsis likely from multiple sources.  Patient was recently admitted last week for sepsis from sacral decubitus wound. My attending was the physician who admitted  her last time.  He does not think reimaging the patient is needed currently.  Will continue with sepsis workup and working getting patient admitted.  Temp Foley placed.  Tylenol  ordered.  Broad-spectrum antibiotics ordered.  I independently  reviewed and interpreted the patient's labs.  Urinalysis shows turbid urine with 100  glucose present.  Moderate mount of hemoglobin.  Greater than 300 protein.  Moderate mild leukocytes with greater than 50 red blood cells and white blood cells.  No bacteria seen but white blood cells present.  Urine culture in process.  CBC does show leukocytosis at 23.8 with a left shift.  Patient's hemoglobin at 9.9 which appears to be at her chronic level.  PT at 22.8.  INR 1.9.  CMP shows a sodium 132, chloride 92.  Glucose at 251.  Patient's creatinine at 5.94 with a BUN of 61.  Decreasing calcium , protein, and albumin .  Alk phos at 139.  Anion gap of 18.  Lactic acid at 2.2 with repeat of 2.1.  CXR shows 1. No acute abnormality. 2. Borderline cardiomegaly. Per radiologist's interpretation.    Patient's IV was not holding for some and she was having difficulty getting fluids in.  Patient received approximately 1 L but is still having volatile blood pressures.  At this time, critical care was called.  Spoke with Dr. Geronimo who recommended starting the patient on pressor and can be placed peripherally or into the hemodialysis catheter after speaking with nephrology.  Unsure where source of infection is coming from given her multiple possible sources however she was started on broad-spectrum per my attending.  My attending spoke to nephrology, Dr. Dalene recommended using the dialysis catheter access for the levo.  Patient is still receiving fluids. Will be transferred via [facility] to [facility] ICU.   I discussed this case with my attending physician who cosigned this note including patient's presenting symptoms, physical exam, and planned diagnostics and interventions. Attending physician stated agreement with plan or made changes to plan which were implemented.   Attending physician assessed patient at bedside.  Portions of this report may have been transcribed using voice recognition software. Every effort was made to ensure accuracy; however, inadvertent computerized transcription errors  may be present.    Final diagnoses:  Septic shock (HCC)  Wound of sacral region, sequela    ED Discharge Orders     None

## 2023-12-29 NOTE — Progress Notes (Signed)
 eLink Physician-Brief Progress Note Patient Name: Susan [name] DOB: June 23, 1950 MRN: [id number]   Date of Service  12/29/2023  HPI/Events of Note  Patient has Temp via foley of 102.21F  Also patient is on Levo @ 5mcg, thru an HD blue port not the pigtail this one doesn't have the pigtail.   eICU Interventions  No treatment for fever.  Hemodynamically insignificant.  Patient has central access for now, eventually, the team will need to place PICC versus central line.   9465 -patient remains lethargic and difficult to arouse.  Not in a position to take oral medications including midodrine  and levothyroxine .  Any pending laboratory called with positive 1 out of 2 blood cultures with GPC.  Already on vancomycin .  Repeat blood cultures today  Intervention Category Minor Interventions: Routine modifications to care plan (e.g. PRN medications for pain, fever)  Azuree Minish 12/29/2023, 9:02 PM

## 2023-12-29 NOTE — Consult Note (Signed)
 Taneal K [facility] [birth year]  [id number].    Requesting MD: Dorethia Cave  Chief Complaint/Reason for Consult: Sacral Decubital wound   HPI: ARIANAH TORGESON is a 73 y.o. female with PMHx significant for T2DM, Stroke, HTN, ESRD on dialysis, CHF who presents to [facility] ED from [facility] with AMS and hypotension secondary to suspected sepsis of unknown source. Patient was recently admitted with similar symptoms and was recently discharged on 8/1. Patient is altered mentally and not able to give a good HPI, husband not presently at bedside. History obtained mostly from medical record.   ROS: As per HPI   Family History  Problem Relation Age of Onset   Heart attack Mother 51    Past Medical History:  Diagnosis Date   Allergy    Anemia    hx   Anxiety    Arthritis    generalized (03/15/2014)   CAD (coronary artery disease)    MI in 2000 - MI  2007 - treated bare metal stent (no nuclear since then as 9/11)   Carotid artery disease (HCC)    CHF (congestive heart failure) (HCC)    Chronic diastolic heart failure (HCC)    a) ECHO (08/2013) EF 55-60% and RV function nl b) RHC (08/2013) RA 4, RV 30/5/7, PA 25/10 (16), PCWP 7, Fick CO/CI 6.3/2.7, PVR 1.5 WU, PA 61 and 66%   Clotting disorder (HCC)    Daily headache    ~ every other day; since I fell in June (03/15/2014)   Depression    Diabetic retinopathy (HCC)    Dyslipidemia    ESRD (end stage renal disease) (HCC)    Dialysis on Tues Thurs Sat   Exertional shortness of breath    GERD (gastroesophageal reflux disease)    History of blood transfusion    History of kidney stones    HTN (hypertension)    Hypothyroidism    Myocardial infarction (HCC)    Obesity    Osteoarthritis    Oxygen  deficiency    PAF (paroxysmal atrial fibrillation) (HCC)    Peripheral neuropathy    bilateral feet/hands   PONV (postoperative nausea and vomiting)    RBBB (right bundle branch block)    Old   Stroke (HCC)    mini strokes   Type II  diabetes mellitus (HCC)    Type II, Barnet libre left upper arm. patient has omnipod insulin  pump with Novolin R Insulin     Past Surgical History:  Procedure Laterality Date   A/V FISTULAGRAM Left 11/07/2022   Procedure: A/V Fistulagram;  Surgeon: Magda Debby SAILOR, MD;  [facility] IN[facility];  Service: Cardiovascular;  Laterality: Left;   ABDOMINAL HYSTERECTOMY  1980's   AMPUTATION Right 02/24/2018   Procedure: RIGHT FOOT GREAT TOE AND 2ND TOE AMPUTATION;  Surgeon: Harden Jerona GAILS, MD;  Location: [facility] OR;  Service: Orthopedics;  Laterality: Right;   AMPUTATION Right 04/30/2018   Procedure: RIGHT TRANSMETATARSAL AMPUTATION;  Surgeon: Harden Jerona GAILS, MD;  Location: [facility] OR;  Service: Orthopedics;  Laterality: Right;   AMPUTATION Right 05/02/2022   Procedure: RIGHT BELOW KNEE AMPUTATION;  Surgeon: Harden Jerona GAILS, MD;  Location: [facility] OR;  Service: Orthopedics;  Laterality: Right;   APPLICATION OF WOUND VAC Right 06/13/2022   Procedure: APPLICATION OF WOUND VAC;  Surgeon: Harden Jerona GAILS, MD;  [facility] OR;  Service: Orthopedics;  Laterality: Right;   APPLICATION OF WOUND VAC Left 11/14/2022   Procedure: APPLICATION OF WOUND VAC;  Surgeon: Harden,  Jerona GAILS, MD;  [facility] OR;  Service: Orthopedics;  Laterality: Left;   AV FISTULA PLACEMENT Left 04/02/2022   Procedure: LEFT ARM ARTERIOVENOUS (AV) FISTULA CREATION;  Surgeon: Serene Gaile ORN, MD;  [facility];  Service: Vascular;  Laterality: Left;  PERIPHERAL NERVE BLOCK   AV FISTULA PLACEMENT Right 04/27/2023   Procedure: RIGHT ARM BRACHIOBASILIC ARTERIOVENOUS (AV) FISTULA CREATION;  Surgeon: Magda Debby SAILOR, MD;  [facility];  Service: Vascular;  Laterality: Right;   BASCILIC VEIN TRANSPOSITION Left 07/31/2022   Procedure: LEFT ARM SECOND STAGE BASILIC VEIN TRANSPOSITION;  Surgeon: Serene Gaile ORN, MD;  [facility];  Service: Vascular;  Laterality: Left;   BIOPSY  05/27/2020   Procedure: BIOPSY;  Surgeon: Cindie Carlin POUR, DO;   [facility]O [facility];  Service: Endoscopy;;   CATARACT EXTRACTION, BILATERAL Bilateral ?2013   COLONOSCOPY W/ POLYPECTOMY     COLONOSCOPY WITH PROPOFOL  N/A 03/13/2019   Procedure: COLONOSCOPY WITH PROPOFOL ;  Surgeon: Albertus Gordy HERO, MD;  Location: [facility] ENDOSCOPY;  Service: Gastroenterology;  Laterality: N/A;   CORONARY ANGIOPLASTY WITH STENT PLACEMENT  1999; 2007   1 + 1   ERCP N/A 02/03/2022   Procedure: ENDOSCOPIC RETROGRADE CHOLANGIOPANCREATOGRAPHY (ERCP);  Surgeon: Rollin Dover, MD;  Location: [facility] ENDOSCOPY;  Service: Gastroenterology;  Laterality: N/A;   ESOPHAGOGASTRODUODENOSCOPY N/A 02/12/2023   Procedure: ESOPHAGOGASTRODUODENOSCOPY (EGD);  Surgeon: Rollin Dover, MD;  Location: [facility] ENDOSCOPY;  Service: Gastroenterology;  Laterality: N/A;   ESOPHAGOGASTRODUODENOSCOPY (EGD) WITH PROPOFOL  N/A 03/13/2019   Procedure: ESOPHAGOGASTRODUODENOSCOPY (EGD) WITH PROPOFOL ;  Surgeon: Albertus Gordy HERO, MD;  Location: [facility] ENDOSCOPY;  Service: Gastroenterology;  Laterality: N/A;   ESOPHAGOGASTRODUODENOSCOPY (EGD) WITH PROPOFOL  N/A 05/27/2020   Procedure: ESOPHAGOGASTRODUODENOSCOPY (EGD) WITH PROPOFOL ;  Surgeon: Cindie Carlin POUR, DO;  [facility];  Service: Endoscopy;  Laterality: N/A;   ESOPHAGOGASTRODUODENOSCOPY (EGD) WITH PROPOFOL  N/A 09/03/2022   Procedure: ESOPHAGOGASTRODUODENOSCOPY (EGD) WITH PROPOFOL ;  Surgeon: Rollin Dover, MD;  Location: [facility] ENDOSCOPY;  Service: Gastroenterology;  Laterality: N/A;   EYE SURGERY Bilateral    lazer   FLEXIBLE SIGMOIDOSCOPY N/A 05/23/2022   Procedure: FLEXIBLE SIGMOIDOSCOPY;  Surgeon: Rollin Dover, MD;  Location: [facility] ENDOSCOPY;  Service: Gastroenterology;  Laterality: N/A;   FLEXIBLE SIGMOIDOSCOPY N/A 05/24/2022   Procedure: FLEXIBLE SIGMOIDOSCOPY;  Surgeon: Federico Rosario BROCKS, MD;  Location: [facility] ENDOSCOPY;  Service: Gastroenterology;  Laterality: N/A;   FLEXIBLE SIGMOIDOSCOPY N/A 09/03/2022   Procedure: FLEXIBLE SIGMOIDOSCOPY;  Surgeon: Rollin Dover, MD;   Location: [facility] ENDOSCOPY;  Service: Gastroenterology;  Laterality: N/A;   HEMOSTASIS CLIP PLACEMENT  03/13/2019   Procedure: HEMOSTASIS CLIP PLACEMENT;  Surgeon: Albertus Gordy HERO, MD;  Location: [facility] ENDOSCOPY;  Service: Gastroenterology;;   HEMOSTASIS CLIP PLACEMENT  05/23/2022   Procedure: HEMOSTASIS CLIP PLACEMENT;  Surgeon: Rollin Dover, MD;  Location: [facility] ENDOSCOPY;  Service: Gastroenterology;;   HEMOSTASIS CONTROL  05/24/2022   Procedure: HEMOSTASIS CONTROL;  Surgeon: Federico Rosario BROCKS, MD;  Location: [facility] ENDOSCOPY;  Service: Gastroenterology;;   HOT HEMOSTASIS N/A 05/23/2022   Procedure: HOT HEMOSTASIS (ARGON PLASMA COAGULATION/BICAP);  Surgeon: Rollin Dover, MD;  Location: [facility] ENDOSCOPY;  Service: Gastroenterology;  Laterality: N/A;   I & D EXTREMITY Left 05/05/2022   Procedure: IRRIGATION AND DEBRIDEMENT LEFT ARM AV FISTULA;  Surgeon: Gretta Lonni PARAS, MD;  Location: [facility] OR;  Service: Vascular;  Laterality: Left;   I & D EXTREMITY N/A 11/14/2022   Procedure: IRRIGATION AND DEBRIDEMENT OF LOWER EXTREMITY WOUND;  Surgeon: Harden Jerona GAILS, MD;  [facility];  Service: Orthopedics;  Laterality: N/A;   INSERTION OF DIALYSIS CATHETER Right 04/02/2022   Procedure: INSERTION OF TUNNELED DIALYSIS CATHETER;  Surgeon: Serene Gaile ORN, MD;  [facility];  Service: Vascular;  Laterality: Right;   IR FLUORO GUIDE CV LINE RIGHT  08/10/2023   IR VENOCAVAGRAM IVC  08/10/2023   KNEE ARTHROSCOPY Left 10/25/2006   POLYPECTOMY  03/13/2019   Procedure: POLYPECTOMY;  Surgeon: Albertus Gordy HERO, MD;  Location: [facility] ENDOSCOPY;  Service: Gastroenterology;;   REMOVAL OF STONES  02/03/2022   Procedure: REMOVAL OF STONES;  Surgeon: Rollin Dover, MD;  Location: [facility] ENDOSCOPY;  Service: Gastroenterology;;   REVISON OF ARTERIOVENOUS FISTULA Left 08/20/2022   Procedure: REVISON OF LEFT ARM ARTERIOVENOUS FISTULA;  Surgeon: Serene Gaile ORN, MD;  [facility];  Service: Vascular;  Laterality: Left;   RIGHT HEART CATH N/A  07/24/2017   Procedure: RIGHT HEART CATH;  Surgeon: Cherrie Toribio SAUNDERS, MD;  [facility];  Service: Cardiovascular;  Laterality: N/A;   RIGHT HEART CATHETERIZATION N/A 09/22/2013   Procedure: RIGHT HEART CATH;  Surgeon: Toribio SAUNDERS Cherrie, MD;  Location: [facility] CATH LAB;  Service: Cardiovascular;  Laterality: N/A;   SHOULDER ARTHROSCOPY WITH OPEN ROTATOR CUFF REPAIR Right 03/14/2014   Procedure: RIGHT SHOULDER ARTHROSCOPY WITH BICEPS RELEASE, OPEN SUBSCAPULA REPAIR, OPEN SUPRASPINATUS REPAIR.;  Surgeon: Cordella Glendia Hutchinson, MD;  Location: [facility] OR;  Service: Orthopedics;  Laterality: Right;   SPHINCTEROTOMY  02/03/2022   Procedure: SPHINCTEROTOMY;  Surgeon: Rollin Dover, MD;  Location: [facility] ENDOSCOPY;  Service: Gastroenterology;;   STUMP REVISION Right 06/13/2022   Procedure: REVISION RIGHT BELOW KNEE AMPUTATION;  Surgeon: Harden Jerona GAILS, MD;  Location: [facility] OR;  Service: Orthopedics;  Laterality: Right;   TEE WITHOUT CARDIOVERSION N/A 02/04/2022   Procedure: TRANSESOPHAGEAL ECHOCARDIOGRAM (TEE);  Surgeon: Cherrie Toribio SAUNDERS, MD;  Location: [facility] ENDOSCOPY;  Service: Cardiovascular;  Laterality: N/A;   THROMBECTOMY W/ EMBOLECTOMY Left 08/20/2022   Procedure: THROMBECTOMY OF LEFT ARM ARTERIOVENOUS FISTULA;  Surgeon: Serene Gaile ORN, MD;  Location: [facility] OR;  Service: Vascular;  Laterality: Left;   TOE AMPUTATION Right 02/24/2018   GREAT TOE AND 2ND TOE AMPUTATION   TUBAL LIGATION  1970's   UPPER EXTREMITY VENOGRAPHY N/A 07/31/2023   Procedure: UPPER EXTREMITY VENOGRAPHY;  Surgeon: Magda Debby SAILOR, MD;  [facility];  Service: Cardiovascular;  Laterality: N/A;    Social History:  reports that she quit smoking about 26 years ago. Her smoking use included cigarettes. She started smoking about 58 years ago. She has a 96 pack-year smoking history. She has never used smokeless tobacco. She reports that she does not currently use alcohol . She reports that she does not use drugs.  Allergies:   Allergies  Allergen Reactions   Codeine Nausea And Vomiting   Keflex  [Cephalexin ] Diarrhea   Macrobid  [Nitrofurantoin ] Diarrhea and Nausea And Vomiting    Medications Prior to Admission  Medication Sig Dispense Refill   acetaminophen  (TYLENOL ) 500 MG tablet Take 1,000 mg by mouth every 6 (six) hours as needed for moderate pain (pain score 4-6), fever or headache.     albuterol  (VENTOLIN  HFA) 108 (90 Base) MCG/ACT inhaler Inhale 1-2 puffs into the lungs every 6 (six) hours as needed for shortness of breath. 1 each 3   amiodarone  (PACERONE ) 200 MG tablet Take 1 tablet (200 mg total) by mouth daily. 90 tablet 3   apixaban  (ELIQUIS ) 5 MG TABS tablet Take 1 tablet (5 mg total) by mouth 2 (two) times daily. 180 tablet  1   Ascorbic Acid  (VITAMIN C  PO) Take 1 tablet by mouth daily.     atorvastatin  (LIPITOR ) 80 MG tablet Take 1 tablet (80 mg total) by mouth daily. 90 tablet 1   B Complex-C-Folic Acid  (DIALYVITE TABLET) TABS Take 1 tablet by mouth daily.     B Complex-C-Zn-Folic Acid  (DIALYVITE 800-ZINC  15) 0.8 MG TABS Take 1 tablet by mouth daily.     calcitRIOL  (ROCALTROL ) 0.25 MCG capsule Take 9 capsules (2.25 mcg total) by mouth Every Tuesday,Thursday,and Saturday with dialysis. (Patient taking differently: Take 0.25 mcg by mouth daily.) 30 capsule 0   clonazePAM  (KLONOPIN ) 0.5 MG disintegrating tablet Take 1 tablet (0.5 mg total) by mouth 2 (two) times daily. Stop lorazepam . 60 tablet 1   folic acid  (FOLVITE ) 1 MG tablet TAKE 1 TABLET BY MOUTH EVERY DAY 90 tablet 1   insulin  aspart (NOVOLOG ) 100 UNIT/ML injection Inject 100 Units into the skin at bedtime. Load insulin  pump with 100 units, use over night.     Insulin  Disposable Pump (OMNIPOD 5 [id number] PODS GEN 5) MISC Inject 1 Application into the skin 3 days.     insulin  glargine (LANTUS  SOLOSTAR) 100 UNIT/ML Solostar Pen Inject 40-44 Units into the skin at bedtime as needed (insulin  pump malfunction). (Patient not taking: Reported on 12/18/2023)      lactulose  (CHRONULAC ) 10 GM/15ML solution Take 20 g by mouth daily as needed (constipation).     levothyroxine  (SYNTHROID ) 50 MCG tablet Take 1 tablet (50 mcg total) by mouth daily before breakfast. 90 tablet 1   MELATONIN PO Take 1 tablet by mouth at bedtime.     methocarbamol  (ROBAXIN ) 750 MG tablet TAKE 1 TABLET (750 MG TOTAL) BY MOUTH EVERY 6 (SIX) HOURS AS NEEDED FOR MUSCLE SPASMS 60 tablet 0   Methoxy PEG-Epoetin  Beta (MIRCERA IJ) Mircera     midodrine  (PROAMATINE ) 10 MG tablet Take 1-2 tablets (10-20 mg total) by mouth Every Tuesday,Thursday,and Saturday with dialysis. 30 tablet 1   Multiple Vitamins-Minerals (ZINC  PO) Take 1 tablet by mouth daily.     nitroGLYCERIN  (NITROSTAT ) 0.4 MG SL tablet Place 1 tablet (0.4 mg total) under the tongue every 5 (five) minutes as needed. 25 tablet 3   nystatin  cream (MYCOSTATIN ) Apply 1 Application topically 2 (two) times daily.     ofloxacin (OCUFLOX) 0.3 % ophthalmic solution Place 1 drop into the left eye 4 (four) times daily. (Patient not taking: Reported on 12/18/2023)     oxyCODONE -acetaminophen  (PERCOCET/ROXICET) 5-325 MG tablet Take 1 tablet by mouth every 6 (six) hours as needed for severe pain (pain score 7-10).     OXYGEN  Inhale 2 L/min into the lungs at bedtime.     pantoprazole  (PROTONIX ) 40 MG tablet Take 1 tablet (40 mg total) by mouth daily. 180 tablet 1   polyethylene glycol (MIRALAX  / GLYCOLAX ) 17 g packet Take 17 g by mouth daily. (Patient taking differently: Take 17 g by mouth daily as needed (constipation).)     prednisoLONE acetate (PRED FORTE) 1 % ophthalmic suspension 1 drop 4 (four) times daily. (Patient not taking: Reported on 12/18/2023)     pregabalin  (LYRICA ) 150 MG capsule Take 150 mg by mouth 2 (two) times daily.     sevelamer  carbonate (RENVELA ) 800 MG tablet Take 800-1,600 mg by mouth See admin instructions. Take 2 tablets (1600mg ) by mouth three times daily with meals and take 1 tablet (800mg ) daily as needed for snacks.      venlafaxine  XR (EFFEXOR -XR) 75 MG 24 hr capsule  Take 1 capsule (75 mg total) by mouth daily with breakfast. Stop trintellix  90 capsule 1     Physical Exam: Blood pressure 108/85, pulse (!) 103, temperature 98.4 F (36.9 C), temperature source Oral, resp. rate 14, height 5' 5 (1.651 m), weight 81.7 kg, SpO2 95%.  Constitutional:      General: She is in acute distress.     Appearance: She is toxic-appearing.  Cardiovascular:     Rate and Rhythm: Tachycardia present.  Pulmonary:     Effort: Pulmonary effort is normal.  Musculoskeletal:     Comments: Sacral wound with eschar. There is some necrotic tissue present and some pale pink areas. There is no purulent drainage or but malodor noted. There is some maceration around the wound.   Skin:    General: Skin is warm and dry.  Neurological:     Mental Status: She is disoriented.     Results for orders placed or performed during the hospital encounter of 12/29/23 (from the past 48 hours)  CBG monitoring, ED     Status: Abnormal   Collection Time: 12/29/23  9:56 AM  Result Value Ref Range   Glucose-Capillary 254 (H) 70 - 99 mg/dL    Comment: Glucose reference range applies only to samples taken after fasting for at least 8 hours.  Resp panel by RT-PCR (RSV, Flu A&B, Covid) Anterior Nasal Swab     Status: None   Collection Time: 12/29/23 10:14 AM   Specimen: Anterior Nasal Swab  Result Value Ref Range   SARS Coronavirus 2 by RT PCR NEGATIVE NEGATIVE    Comment: (NOTE) SARS-CoV-2 target nucleic acids are NOT DETECTED.  The SARS-CoV-2 RNA is generally detectable in upper respiratory specimens during the acute phase of infection. The lowest concentration of SARS-CoV-2 viral copies this assay can detect is 138 copies/mL. A negative result does not preclude SARS-Cov-2 infection and should not be used as the sole basis for treatment or other patient management decisions. A negative result may occur with  improper specimen  collection/handling, submission of specimen other than nasopharyngeal swab, presence of viral mutation(s) within the areas targeted by this assay, and inadequate number of viral copies(<138 copies/mL). A negative result must be combined with clinical observations, patient history, and epidemiological information. The expected result is Negative.  Fact Sheet for Patients:  BloggerCourse.com  Fact Sheet for Healthcare Providers:  SeriousBroker.it  This test is no t yet approved or cleared by the [place]  FDA and  has been authorized for detection and/or diagnosis of SARS-CoV-2 by FDA under an Emergency Use Authorization (EUA). This EUA will remain  in effect (meaning this test can be used) for the duration of the COVID-19 declaration under Section 564(b)(1) of the Act, 21 U.S.C.section 360bbb-3(b)(1), unless the authorization is terminated  or revoked sooner.       Influenza A by PCR NEGATIVE NEGATIVE   Influenza B by PCR NEGATIVE NEGATIVE    Comment: (NOTE) The Xpert Xpress SARS-CoV-2/FLU/RSV plus assay is intended as an aid in the diagnosis of influenza from Nasopharyngeal swab specimens and should not be used as a sole basis for treatment. Nasal washings and aspirates are unacceptable for Xpert Xpress SARS-CoV-2/FLU/RSV testing.  Fact Sheet for Patients: BloggerCourse.com  Fact Sheet for Healthcare Providers: SeriousBroker.it  This test is not yet approved or cleared by the [place]  FDA and has been authorized for detection and/or diagnosis of SARS-CoV-2 by FDA under an Emergency Use Authorization (EUA). This EUA will remain in effect (meaning this  test can be used) for the duration of the COVID-19 declaration under Section 564(b)(1) of the Act, 21 U.S.C. section 360bbb-3(b)(1), unless the authorization is terminated or revoked.     Resp Syncytial Virus by PCR  NEGATIVE NEGATIVE    Comment: (NOTE) Fact Sheet for Patients: BloggerCourse.com  Fact Sheet for Healthcare Providers: SeriousBroker.it  This test is not yet approved or cleared by the [place]  FDA and has been authorized for detection and/or diagnosis of SARS-CoV-2 by FDA under an Emergency Use Authorization (EUA). This EUA will remain in effect (meaning this test can be used) for the duration of the COVID-19 declaration under Section 564(b)(1) of the Act, 21 U.S.C. section 360bbb-3(b)(1), unless the authorization is terminated or revoked.  Performed at [facility], [address], [place], [place] [address]   Comprehensive metabolic panel     Status: Abnormal   Collection Time: 12/29/23 10:45 AM  Result Value Ref Range   Sodium 132 (L) 135 - 145 mmol/L   Potassium 3.8 3.5 - 5.1 mmol/L   Chloride 92 (L) 98 - 111 mmol/L   CO2 22 22 - 32 mmol/L   Glucose, Bld 251 (H) 70 - 99 mg/dL    Comment: Glucose reference range applies only to samples taken after fasting for at least 8 hours.   BUN 61 (H) 8 - 23 mg/dL   Creatinine, Ser 4.05 (H) 0.44 - 1.00 mg/dL   Calcium  8.6 (L) 8.9 - 10.3 mg/dL   Total Protein 6.0 (L) 6.5 - 8.1 g/dL   Albumin  2.2 (L) 3.5 - 5.0 g/dL   AST 21 15 - 41 U/L   ALT 12 0 - 44 U/L   Alkaline Phosphatase 139 (H) 38 - 126 U/L   Total Bilirubin 0.7 0.0 - 1.2 mg/dL   GFR, Estimated 7 (L) >60 mL/min    Comment: (NOTE) Calculated using the CKD-EPI Creatinine Equation (2021)    Anion gap 18 (H) 5 - 15    Comment: Performed at [facility], [address], [place], [place] [address]  Lactic acid, plasma     Status: Abnormal   Collection Time: 12/29/23 10:45 AM  Result Value Ref Range   Lactic Acid, Venous 2.2 (HH) 0.5 - 1.9 mmol/L    Comment: CRITICAL RESULT CALLED TO, READ BACK BY AND VERIFIED WITH  WHITE,M AT 11:15AM ON 12/29/23 BY EUDELIA BROCKS Performed at [facility], [address], [place], [place]  [address]   CBC with Differential     Status: Abnormal   Collection Time: 12/29/23 10:45 AM  Result Value Ref Range   WBC 23.8 (H) 4.0 - 10.5 K/uL   RBC 3.74 (L) 3.87 - 5.11 MIL/uL   Hemoglobin 9.9 (L) 12.0 - 15.0 g/dL   HCT 67.0 (L) 63.9 - 53.9 %   MCV 88.0 80.0 - 100.0 fL   MCH 26.5 26.0 - 34.0 pg   MCHC 30.1 30.0 - 36.0 g/dL   RDW 81.8 (H) 88.4 - 84.4 %   Platelets 281 150 - 400 K/uL   nRBC 0.0 0.0 - 0.2 %   Neutrophils Relative % 91 %   Neutro Abs 21.7 (H) 1.7 - 7.7 K/uL   Lymphocytes Relative 2 %   Lymphs Abs 0.5 (L) 0.7 - 4.0 K/uL   Monocytes Relative 5 %   Monocytes Absolute 1.1 (H) 0.1 - 1.0 K/uL   Eosinophils Relative 1 %   Eosinophils Absolute 0.2 0.0 - 0.5 K/uL   Basophils Relative 0 %   Basophils Absolute 0.0  0.0 - 0.1 K/uL   Immature Granulocytes 1 %   Abs Immature Granulocytes 0.21 (H) 0.00 - 0.07 K/uL    Comment: Performed at [facility], [address], [place], [place] [address]  Urinalysis, w/ Reflex to Culture (Infection Suspected) -Urine, Clean Catch     Status: Abnormal   Collection Time: 12/29/23 11:04 AM  Result Value Ref Range   Specimen Source URINE, CATHETERIZED    Color, Urine YELLOW YELLOW   APPearance TURBID (A) CLEAR   Specific Gravity, Urine 1.018 1.005 - 1.030   pH 6.0 5.0 - 8.0   Glucose, UA 150 (A) NEGATIVE mg/dL   Hgb urine dipstick MODERATE (A) NEGATIVE   Bilirubin Urine NEGATIVE NEGATIVE   Ketones, ur NEGATIVE NEGATIVE mg/dL   Protein, ur >=699 (A) NEGATIVE mg/dL   Nitrite NEGATIVE NEGATIVE   Leukocytes,Ua MODERATE (A) NEGATIVE   RBC / HPF >50 0 - 5 RBC/hpf   WBC, UA >50 0 - 5 WBC/hpf    Comment:        Reflex urine culture not performed if WBC <=10, OR if Squamous epithelial cells >5. If Squamous epithelial cells >5 suggest recollection.    Bacteria, UA NONE SEEN NONE SEEN   Squamous Epithelial / HPF 0-5 0 - 5 /HPF   WBC Clumps PRESENT     Comment: Performed at [facility], [address] [place], [place] [address]  Lactic  acid, plasma     Status: Abnormal   Collection Time: 12/29/23 12:06 PM  Result Value Ref Range   Lactic Acid, Venous 2.1 (HH) 0.5 - 1.9 mmol/L    Comment: CRITICAL VALUE NOTED. VALUE IS CONSISTENT WITH PREVIOUSLY REPORTED/CALLED VALUE Performed at [facility], [address] [place], [place] [address]   Protime-INR     Status: Abnormal   Collection Time: 12/29/23 12:06 PM  Result Value Ref Range   Prothrombin  Time 22.8 (H) 11.4 - 15.2 seconds   INR 1.9 (H) 0.8 - 1.2    Comment: (NOTE) INR goal varies based on device and disease states. Performed at [facility], [address] [place], [place] [address]    *Note: Due to a large number of results and/or encounters for the requested time period, some results have not been displayed. A complete set of results can be found in Results Review.   DG Chest Port 1 View Result Date: 12/29/2023 CLINICAL DATA:  Altered mental status. Admitted 1 week ago for sepsis. Hypotensive. Missed dialysis today. Ex-smoker. EXAM: PORTABLE CHEST 1 VIEW COMPARISON:  12/17/2023. Chest, abdomen and pelvis CTA dated 08/05/2023. FINDINGS: Again demonstrated is a poor inspiration with elevation of the right hemidiaphragm. Stable borderline enlarged cardiac silhouette. Tortuous and partially calcified thoracic aorta. Stable right jugular double-lumen catheter with its tip in the region of the superior cavoatrial junction. Thoracic spine degenerative changes. IMPRESSION: 1. No acute abnormality. 2. Borderline cardiomegaly. Electronically Signed   By: Elspeth Bathe M.D.   On: 12/29/2023 11:37    Anti-infectives (From admission, onward)    Start     Dose/Rate Route Frequency Ordered Stop   01/02/2024 1200  vancomycin  (VANCOCIN ) IVPB 1000 mg/200 mL premix        1,000 mg 200 mL/hr over 60 Minutes Intravenous Every T-Th-Sa (Hemodialysis) 12/29/23 1537     12/29/23 1630  meropenem  (MERREM ) 1 g in sodium chloride  0.9 % 100 mL IVPB        1 g 200 mL/hr over 30 Minutes  Intravenous Daily-1800 12/29/23 1537  12/29/23 1030  vancomycin  (VANCOREADY) IVPB 1500 mg/300 mL        1,500 mg 150 mL/hr over 120 Minutes Intravenous  Once 12/29/23 1020     12/29/23 1015  metroNIDAZOLE  (FLAGYL ) IVPB 500 mg  Status:  Discontinued        500 mg 100 mL/hr over 60 Minutes Intravenous  Once 12/29/23 1014 12/29/23 1014   12/29/23 1015  vancomycin  (VANCOCIN ) IVPB 1000 mg/200 mL premix  Status:  Discontinued        1,000 mg 200 mL/hr over 60 Minutes Intravenous  Once 12/29/23 1014 12/29/23 1019   12/29/23 1015  cefTRIAXone  (ROCEPHIN ) 2 g in sodium chloride  0.9 % 100 mL IVPB        2 g 200 mL/hr over 30 Minutes Intravenous Once 12/29/23 1014 12/29/23 1313   12/29/23 1015  metroNIDAZOLE  (FLAGYL ) IVPB 500 mg        500 mg 100 mL/hr over 60 Minutes Intravenous  Once 12/29/23 1014 12/29/23 1333       Assessment/Plan Sacral Decubital Wound   -CT scan pending  -Lactate 2.1 with WBC 23.8 K.  -Patient currently on Norepinephrine  for hypotension secondary to suspected sepsis. There is no clear source of whether it is d/t sacral wound, LLE wound or suspected UTI.  -Sacral wound appears to have progressed based compared to the last admission.  -Plan is to unroof and debride sacral wound in OR vs bedside tomorrow, 8/6 depending on patient hemodynamic status. Plan discussed with my attending.  -Wound care consult recommended.   FEN - NPO  VTE - SCDs  ID - Meropenem  and Vancomycin  per pharmacy    I reviewed nursing notes, ED provider notes, last 24 h vitals and pain scores, last 48 h intake and output, last 24 h labs and trends, and last 24 h imaging results.   Eulah Hammonds, [facility] 12/29/2023, 3:42 PM Please see Amion for pager number during day hours 7:00am-4:30pm

## 2023-12-29 NOTE — ED Notes (Addendum)
 Attempted to call report at [phone], CN is assigning bed and nurse will call back when she gets a chance.

## 2023-12-29 NOTE — Progress Notes (Signed)
   12/29/23 2045  Provider Notification  Provider Name/Title [facility]  Date Provider Notified 12/29/23  Time Provider Notified 2045  Method of Notification Call  Notification Reason Change in status (bladder temp 102.9 via foley, oriented only to person and place, very lethargic, uncomfortable giving PO meds, levophed  found infusing through right subclavian DL permanent HD cath (no pigtail))  Provider response Evaluate remotely;No new orders  Date of Provider Response 12/29/23  Time of Provider Response 2103   Ice packs applied to patient

## 2023-12-29 NOTE — Consult Note (Signed)
 [facility] Renal Consultation Note    Indication for Consultation:  Management of ESRD/hemodialysis, anemia, hypertension/volume, and secondary hyperparathyroidism.  Patient is a 73 year old female who was recently discharged from the hospital on 12/25/23 for septic shock second to UTI of proteus mirabilis. Today she presented to [facility] ED with mental status change. She was unable to get her HD treatment due to her illness. In the ED she was hypotensive, given 2.5L of IVF and placed on NE. Her lactic acid was 2.1 and her WBC is 23.8. She is afebrile currently. It does appear that her myoclonic jerking is more pronounced than previously. She is more altered mentally than her baseline. Scr 5.94 and BUN 61.  She had a shortened HD treatment at [facility] on 12/26/23. She normally stays for her full treatments and has not had any recent missed treatments that I can see. She gets close to her EDW at treatment.   HPI: Susan [name] is a 73 y.o. female.   Past Medical History:  Diagnosis Date   Allergy    Anemia    hx   Anxiety    Arthritis    generalized (03/15/2014)   CAD (coronary artery disease)    MI in 2000 - MI  2007 - treated bare metal stent (no nuclear since then as 9/11)   Carotid artery disease (HCC)    CHF (congestive heart failure) (HCC)    Chronic diastolic heart failure (HCC)    a) ECHO (08/2013) EF 55-60% and RV function nl b) RHC (08/2013) RA 4, RV 30/5/7, PA 25/10 (16), PCWP 7, Fick CO/CI 6.3/2.7, PVR 1.5 WU, PA 61 and 66%   Clotting disorder (HCC)    Daily headache    ~ every other day; since I fell in June (03/15/2014)   Depression    Diabetic retinopathy (HCC)    Dyslipidemia    ESRD (end stage renal disease) (HCC)    Dialysis on Tues Thurs Sat   Exertional shortness of breath    GERD (gastroesophageal reflux disease)    History of blood transfusion    History of kidney stones    HTN (hypertension)    Hypothyroidism    Myocardial infarction (HCC)    Obesity     Osteoarthritis    Oxygen  deficiency    PAF (paroxysmal atrial fibrillation) (HCC)    Peripheral neuropathy    bilateral feet/hands   PONV (postoperative nausea and vomiting)    RBBB (right bundle branch block)    Old   Stroke (HCC)    mini strokes   Type II diabetes mellitus (HCC)    Type II, [place] libre left upper arm. patient has omnipod insulin  pump with Novolin R Insulin     General: Chronically ill-appearing, in bed in no distress HENT: NCAT, EOMI Lungs: Bilateral chest rise with no increased work of breathing Cardiovascular: Normal rate with no rub Abdomen: soft, nondistended Neuro: Alert and alert, oriented to person place and time Dialysis access: R TDC in place; multiple failed AVF  Physical Exam: Vitals:   12/29/23 1342 12/29/23 1343 12/29/23 1345 12/29/23 1520  BP: (!) 115/94  108/85   Pulse:  99 (!) 103   Resp: (!) 23 18 14    Temp: (!) 100.5 F (38.1 C) (!) 100.5 F (38.1 C) (!) 100.5 F (38.1 C) 98.4 F (36.9 C)  TempSrc:    Oral  SpO2:  99% 95%   Weight:    81.7 kg  Height:  Allergies  Allergen Reactions   Codeine Nausea And Vomiting   Keflex  [Cephalexin ] Diarrhea   Macrobid  [Nitrofurantoin ] Diarrhea and Nausea And Vomiting   Prior to Admission medications   Medication Sig Start Date End Date Taking? Authorizing Provider  acetaminophen  (TYLENOL ) 500 MG tablet Take 1,000 mg by mouth every 6 (six) hours as needed for moderate pain (pain score 4-6), fever or headache.    [provider]  albuterol  (VENTOLIN  HFA) 108 (90 Base) MCG/ACT inhaler Inhale 1-2 puffs into the lungs every 6 (six) hours as needed for shortness of breath. 08/14/23   Duanne Butler DASEN, MD  amiodarone  (PACERONE ) 200 MG tablet Take 1 tablet (200 mg total) by mouth daily. 08/14/23   Duanne Butler DASEN, MD  apixaban  (ELIQUIS ) 5 MG TABS tablet Take 1 tablet (5 mg total) by mouth 2 (two) times daily. 08/14/23   Duanne Butler DASEN, MD  Ascorbic Acid  (VITAMIN C  PO) Take 1  tablet by mouth daily.    [provider]  atorvastatin  (LIPITOR ) 80 MG tablet Take 1 tablet (80 mg total) by mouth daily. 08/14/23   Duanne Butler DASEN, MD  B Complex-C-Folic Acid  (DIALYVITE TABLET) TABS Take 1 tablet by mouth daily.    [provider]  B Complex-C-Zn-Folic Acid  (DIALYVITE 800-ZINC  15) 0.8 MG TABS Take 1 tablet by mouth daily. 12/18/23   [provider]  calcitRIOL  (ROCALTROL ) 0.25 MCG capsule Take 9 capsules (2.25 mcg total) by mouth Every Tuesday,Thursday,and Saturday with dialysis. Patient taking differently: Take 0.25 mcg by mouth daily. 08/15/23   Arlice Reichert, MD  clonazePAM  (KLONOPIN ) 0.5 MG disintegrating tablet Take 1 tablet (0.5 mg total) by mouth 2 (two) times daily. Stop lorazepam . 11/19/23   Duanne Butler DASEN, MD  folic acid  (FOLVITE ) 1 MG tablet TAKE 1 TABLET BY MOUTH EVERY DAY 12/15/23   Duanne Butler DASEN, MD  insulin  aspart (NOVOLOG ) 100 UNIT/ML injection Inject 100 Units into the skin at bedtime. Load insulin  pump with 100 units, use over night.    [provider]  Insulin  Disposable Pump (OMNIPOD 5 [id number] PODS GEN 5) MISC Inject 1 Application into the skin 3 days. 12/13/23   [provider]  insulin  glargine (LANTUS  SOLOSTAR) 100 UNIT/ML Solostar Pen Inject 40-44 Units into the skin at bedtime as needed (insulin  pump malfunction). Patient not taking: Reported on 12/18/2023    [provider]  lactulose  (CHRONULAC ) 10 GM/15ML solution Take 20 g by mouth daily as needed (constipation).    [provider]  levothyroxine  (SYNTHROID ) 50 MCG tablet Take 1 tablet (50 mcg total) by mouth daily before breakfast. 08/14/23   Duanne Butler DASEN, MD  MELATONIN PO Take 1 tablet by mouth at bedtime.    [provider]  methocarbamol  (ROBAXIN ) 750 MG tablet TAKE 1 TABLET (750 MG TOTAL) BY MOUTH EVERY 6 (SIX) HOURS AS NEEDED FOR MUSCLE SPASMS 12/01/23   Duanne Butler DASEN, MD  Methoxy PEG-Epoetin  Beta (MIRCERA IJ) Mircera  02/05/23 02/04/24  [provider]  midodrine  (PROAMATINE ) 10 MG tablet Take 1-2 tablets (10-20 mg total) by mouth Every Tuesday,Thursday,and Saturday with dialysis. 08/15/23   Duanne Butler DASEN, MD  Multiple Vitamins-Minerals (ZINC  PO) Take 1 tablet by mouth daily.    [provider]  nitroGLYCERIN  (NITROSTAT ) 0.4 MG SL tablet Place 1 tablet (0.4 mg total) under the tongue every 5 (five) minutes as needed. 08/14/23 01/30/24  Duanne Butler DASEN, MD  nystatin  cream (MYCOSTATIN ) Apply 1 Application topically 2 (two) times daily.    [provider]  ofloxacin (OCUFLOX) 0.3 % ophthalmic solution Place 1 drop into the left eye 4 (four) times daily. Patient not taking: Reported on 12/18/2023 12/13/23   [provider]  oxyCODONE -acetaminophen  (PERCOCET/ROXICET) 5-325 MG tablet Take 1 tablet by mouth every 6 (six) hours as needed for severe pain (pain score 7-10).    [provider]  OXYGEN  Inhale 2 L/min into the lungs at bedtime.    [provider]  pantoprazole  (PROTONIX ) 40 MG tablet Take 1 tablet (40 mg total) by mouth daily. 08/14/23   Duanne Butler DASEN, MD  polyethylene glycol (MIRALAX  / GLYCOLAX ) 17 g packet Take 17 g by mouth daily. Patient taking differently: Take 17 g by mouth daily as needed (constipation). 12/10/22   Angiulli, Daniel J, PA-C  prednisoLONE acetate (PRED FORTE) 1 % ophthalmic suspension 1 drop 4 (four) times daily. Patient not taking: Reported on 12/18/2023    [provider]  pregabalin  (LYRICA ) 150 MG capsule Take 150 mg by mouth 2 (two) times daily. 11/20/23   [provider]  sevelamer  carbonate (RENVELA ) 800 MG tablet Take 800-1,600 mg by mouth See admin instructions. Take 2 tablets (1600mg ) by mouth three times daily with meals and take 1 tablet (800mg ) daily as needed for snacks.    [provider]  venlafaxine  XR (EFFEXOR -XR) 75 MG 24 hr capsule Take 1 capsule (75 mg total) by mouth daily with breakfast.  Stop trintellix  08/14/23   Duanne Butler DASEN, MD   Current Facility-Administered Medications  Medication Dose Route Frequency Provider Last Rate Last Admin   0.9 %  sodium chloride  infusion  250 mL Intravenous Continuous Zammit, Joseph, MD       0.9 %  sodium chloride  infusion  250 mL Intravenous Continuous Babcock, Peter E, NP       acetaminophen  (TYLENOL ) tablet 1,000 mg  1,000 mg Oral Q6H PRN Babcock, Peter E, NP       albuterol  (PROVENTIL ) (2.5 MG/3ML) 0.083% nebulizer solution 2.5 mg  2.5 mg Inhalation Q6H PRN Babcock, Peter E, NP       amiodarone  (PACERONE ) tablet 200 mg  200 mg Oral Daily Babcock, Peter E, NP       atorvastatin  (LIPITOR ) tablet 80 mg  80 mg Oral Daily Babcock, Peter E, NP       [START ON 01/14/2024] calcitRIOL  (ROCALTROL ) capsule 2.25 mcg  2.25 mcg Oral Q T,Th,Sa-HD Babcock, Peter E, NP       Chlorhexidine  Gluconate Cloth 2 % PADS 6 each  6 each Topical Daily Babcock, Peter E, NP       collagenase  (SANTYL ) ointment   Topical Daily Babcock, Peter E, NP       docusate sodium  (COLACE) capsule 100 mg  100 mg Oral BID PRN Babcock, Peter E, NP       folic acid  (FOLVITE ) tablet 1 mg  1 mg Oral Daily Babcock, Peter E, NP       insulin  aspart (novoLOG ) injection 0-20 Units  0-20 Units Subcutaneous Q4H Babcock, Peter E, NP       lactated ringers  infusion   Intravenous Continuous Jenna Maude BRAVO, NP       [START ON 12/30/2023] levothyroxine  (SYNTHROID ) tablet 50 mcg  50 mcg Oral QAC breakfast Babcock, Peter E, NP       meropenem  (MERREM ) 1 g in sodium chloride  0.9 % 100 mL IVPB  1 g Intravenous q1800 Hershal Vito MATSU, RPH       midodrine  (PROAMATINE ) tablet 5 mg  5  mg Oral Q8H Jenna Maude BRAVO, NP       norepinephrine  (LEVOPHED ) 16 mg in (0.064 mg/mL) premix infusion  0-40 mcg/min Intravenous Titrated Jenna Maude BRAVO, NP 1.88 mL/hr at 12/29/23 1608 2 mcg/min at 12/29/23 1608   pantoprazole  (PROTONIX ) EC tablet 40 mg  40 mg Oral Daily Jenna Maude BRAVO, NP       polyethylene glycol  (MIRALAX  / GLYCOLAX ) packet 17 g  17 g Oral Daily Jenna Maude BRAVO, NP       polyethylene glycol (MIRALAX  / GLYCOLAX ) packet 17 g  17 g Oral Daily PRN Jenna Maude BRAVO, NP       sevelamer  carbonate (RENVELA ) tablet 1,600 mg  1,600 mg Oral TID WC Jenna Maude BRAVO, NP       sevelamer  carbonate (RENVELA ) tablet 800 mg  800 mg Oral PRN Jenna Maude BRAVO, NP       [START ON 01/27/2024] vancomycin  (VANCOCIN ) IVPB 1000 mg/200 mL premix  1,000 mg Intravenous Q T,Th,Sa-HD Hershal Vito MATSU, RPH       vancomycin  (VANCOREADY) IVPB 1500 mg/300 mL  1,500 mg Intravenous Once Suzette Pac, MD       Labs: Basic Metabolic Panel: Recent Labs  Lab 12/23/23 0308 12/24/23 1326 12/29/23 1045  NA 131* 130* 132*  K 4.4 4.4 3.8  CL 94* 94* 92*  CO2 21* 21* 22  GLUCOSE 173* 413* 251*  BUN 45* 62* 61*  CREATININE 4.65* 6.57* 5.94*  CALCIUM  8.6* 8.9 8.6*  PHOS 4.3  --   --    Liver Function Tests: Recent Labs  Lab 12/29/23 1045  AST 21  ALT 12  ALKPHOS 139*  BILITOT 0.7  PROT 6.0*  ALBUMIN  2.2*    CBC: Recent Labs  Lab 12/23/23 0308 12/29/23 1045  WBC 10.5 23.8*  NEUTROABS  --  21.7*  HGB 9.1* 9.9*  HCT 29.7* 32.9*  MCV 85.8 88.0  PLT 310 281    CBG: Recent Labs  Lab 12/24/23 1622 12/24/23 2031 12/25/23 0736 12/25/23 1349 12/29/23 0956  GLUCAP 312* 283* 298* 242* 254*    Dialysis Orders:  [facility] TTS 4 hr - last HD 12/15/23 400 QB/ Manual 800 DFR 2K/2 Ca bath Heparin  4000 U bolus EDW 77.4 Mircera 100 mcg last given 12/01/23 Venofer  100 mg q treatment; last given 12/15/23 Calcitriol  2.5 mcg every treatment Sensipar 30 mg q treatment  Assessment/Plan:  Septic Shock: felt to be from UTI vs cellulitis or sacral wound. Surgery planning on taking her to the OR tomorrow for debridement. Patient on low dose NE pressor for BP support. On Vanc and Meropenem   ESRD:  patient's last HD was on 12/26/23 at [facility]. She does not miss treatments and gets close to her EDW. Last treatment cut short and only  had 2.47 hrs of HD. We will schedule HD for tomorrow 12/30/23 in her room. No urgent HD needs that would require CRRT or urgent HD. She is not hyperkalemic or dyspneic.   Hypertension/volume: patient appears euvolemic on exam. She is currently hypotensive on low dose presser. Continue midodrine  to help ween off of NE. She is above her EDW by 3.5 kg.   Anemia: last dose of mircera 100 mcg given on 11/25/23. Her last dose of venofer  100 mg given 12/15/23. Continue to trend her Hgb. Start Aranesp  if needed.  Metabolic bone disease: Corrected Ca 10.0. Needs updated phos. Continue home binders Sevelamer  1600 mg TID WC.   Nutrition:  Albumin  low. Patient NPO currently.  Chronic  wound of sacrum: [facility] plans for wound debridement on 12/30/23 A fib: Continue amiodarone . Patient unable to take DOAC. Currently on heparin . Transition to DOAC when cleared.   Belvie Och, NP 12/29/2023, 4:14 PM  [facility]

## 2023-12-29 NOTE — H&P (Signed)
 NAME:  Susan [name], MRN:  [id number], DOB:  Nov 01, 1950, LOS: 0 ADMISSION DATE:  12/29/2023, CONSULTATION DATE:  8/5 REFERRING MD:  Radsom PA-C, CHIEF COMPLAINT:  shock    History of Present Illness:  73 year old female still resides at home, cared for by family.  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   Was just discharged from hospital 8/1 after being admitted once again for sepsis and septic shock 2/2 proteus UTI and possible Left lower extremity chronic wound infection +/- infected sacral wound. Completed IV antibiotics home under the care of her husband.   Seen in ortho office 8/4 noted to have recurrent ulcer laterally. The ulcer measures 9 x 5 cm with no purulence no drainage the wound is dry with large eschar. She was awake and oriented w/ no increased efforts noted on this visit.   Presents to ER 8/5 w/ mental status change. Pt not able to get last iHD treatment on day of admit.  Initial BP 74/44 Initial eval: glucose 254,Na 132, BUN 61, cr 5.94, bicarb 22, K 3.8, lactate 2.2, wbc 23.8 (was 10.5 6 days prior),  Urine dirty appearing, INR 1.9.  PCXR CM low volume no obvious infiltrates  Received total of 2.5 liters crystalloid, cultures sent, abx started, remained hypotensive so started on Norepi gtt and transferred to [facility]   Pertinent  Medical History   ESRD TTS, CAD w/ prior MI, Carotid artery disease, HFrEF (EF 30-35%),  PAF, RBBB, chronic hypotension (on midodrine ), Clotting disorder, Anemia, anxiety, arthritis, dyslipidemia, chronic  exertional dyspnea, diabetes type II (poorly controlled)  & on insulin  pump at home, diabetic retinopathy, peripheral neuropathy, Obesity, GERD, osteoarthritis, prior stroke, anxiety and depression, anemia of chronic disease, hypothyroidism   Prior ESBL producing infection as well as prior enterococcus bacteremia, sacral wound (last CT December 17 2023, no obvious abscess (treating w/ chemical debridement via WON-> Santyl ) prior BKA right lower ext.  LLE care: chronic venous diease w/ LLE ulcer, Vashe dressing changes to the left leg daily plus Ace compressive wrap. Elevate is much as possible (last seen by Harden 8/5)  Significant Hospital Events: Including procedures, antibiotic start and stop dates in addition to other pertinent events   8/5 admitted to [facility] from transfer from [facility] ER w/ MS change, shock and new leukocytosis. Working dx septic shock. Potential sources: urinary tract, Left lower extremity wound or sacrum   LEvophed  started in ER at [facility] via Rt tunneled HD cath\ Arrived in PM to 47m13: responsive and protecting airway and answering some questions (name and location). Nursing very concerned about sacral decub and LLE ulcer - both necrotic  Interim History / Subjective:   7/24 admit to [place] for periph pressors Urine ->=100,000 COLONIES/mL PROTEUS MIRABILIS Abnormal   7/25 Various onculst CCS consult  : rec local wound care.   nephro consulted.  Pt changed code status to DNR  [facility] consult Dr Harden - Recurrent venous insufficiency ulcer left lower extremity  - needs seial compression rwaps 7/27 trying to wean pressors abx narrow to rocephi IPAL -  cntnue current care 01/05/24  DISCHARGED HOME xxxxxxxxxxxxxxx 8/5 admitted to [facility] from transfer from [facility] ER w/ MS change, shock and new leukocytosis. Working dx septic shock. Potential sources: urinary tract, Left lower extremity wound or sacrum   LEvophed  started in ER at [facility] via Rt tunneled HD cath\ Arrived in PM to 79m13:  responsive and protecting airway and answering some questions (name and location). Nursing very concerned about sacral decub and LLE ulcer - both necrotic  Objective    Blood pressure (!) 136/123, pulse 80, temperature (!) 100.6 F (38.1 C), resp. rate 11, height 5' 5 (1.651 m), weight 88 kg, SpO2 100%.       No intake or output data in the 24 hours ending 12/29/23 1244 Filed Weights   12/29/23 1002  Weight: 88 kg    Examination: General: chronic unwell. Loosk ok., resting comfortably.  HENT: Looks dry Lungs: CTA anteriorly but basal has crackles Cardiovascular: normal heart sounds Abdomen: Soft Extremities: Rt BKA and LLE intact but has lateral uncler Neuro: alert and partially oriented. No focal deficits GU: not eaxamined  Resolved problem list   Assessment and Plan  Recurrent septic shock w/ lactic acidosis. Source likely urinary tract source BUT also consider the left lateral lower extremity venous ulcer OR the sacral ulcer also bacteremia or HD cath infection  Wounds seem less likely as has not had sig change in would exam over last week     Plan Ensure euvolemic F/u pending cultures Broad spec abx w/ vanc and meropenem  given h/o ESBL producer in past Titrate NE for SBP > 90 (has baseline hypotension on midodrine ), if NE requirements > 62mcg/min will add vasopressin  and stress dose steroids.  Cont midodrine  F/u lactate Trend cbc Trend PCT  Chronic wound. Unstageable pressure ulcer of sacrum and left lateral LE chronic venous ulcer (POA). Last seen by ortho 8/4   Plan Cont santyl  dressing to sacral wound daily Cont vashe dressings w/ ace wrap to LLE daily elevate LE Air mattress Will ask WOC to follow up and add any new recs since last visit  CCS consult called by Jeralyn Banner  Acute metabolic encephalopathy 2/2 sepsis w/ h/o anxiety and depression   - seems some better after BP optmization relative to ER  Plan Supportive care Treat sepsis  -  Carbapenem Holding her clonazepam  for now but watch closely for evidence of wd.  Cont to hold her percocet and muscle relaxer  Hold her Lyrica  (but resume when mental status allows) as well as effexor  XR for now  H/o AF, HFrEF  (30-35%), CAD, HLD and chronic hypotension Admitted with circulatory shock - dehydration, sepsis  Plan Levophed  via HD cath Re: a fib: Rate control (cont amiodarone  VT if needed) IV heparin  while can't take orals (then resume DOAC when able) Tele  Not candidate for GDMT given chronic hypotension Consider repeat ECHO If central access placed send co-ox Resume her statin when able   ESRD TTS. Last iHD on 8/2   Plan Notify renal of admit - Called from 22M No immediate need for dialysis but if needed acutely over next 24 hrs will likely need CRRT  Anion gap metabolic acidosis. Lactic acid only 2.1 do not think this explains her anion gap. ? Renal failure ? DKA  Plan Treating shock and repeating lactate Send Beta hydroxybutyric acid Check ABG  Type II DM (poorly controlled) w/ hyperglycemia and both neuropathy and retinopathy Plan Hold her insulin  pump If her beta hydroxybutyric acid is  elevated start insulin  gtt, otherwise Ssi goal 140-180 Start lantus  at 1/2 dose of home Rx if CBGs remain > 200 (start 22 units/d) assuming she stays on SSI.  Holding lyrica  Cont her home eye gtts  Anemia of chronic disease -no evidence of bleeding Plan Cbc in am  Transfuse for blood loss or hgb < 7  Hypothyroidism  Plan Cont replacement   GERD  Plan PPI  Code status  Plan    Best Practice (right click and Reselect all SmartList Selections daily)   Diet/type: NPO w/ oral meds DVT prophylaxis other - hold doac,s tart IV heparin  Pressure ulcer(s): present on admission  -st age 72 large in sacrum + LLE  GI prophylaxis: PPI Lines: RT tunneled HD cath Foley:  N/A Code Status:  full code Last date of multidisciplinary goals of care discussion [no faimiuly  at bedisde] Goals: Pall care consult called    ATTESTATION & SIGNATURE   The patient Susan [name] is critically ill with multiple organ systems failure and requires high complexity decision making for assessment and support, frequent evaluation and titration of therapies, application of advanced monitoring technologies and extensive interpretation of multiple databases and discussion with other appropriate health care personnel such as bedside nurses, social workers, case Production designer, theatre/television/film, consultants, respiratory therapists, nutritionists, secretaries etc.,  Critical care time includes but is not restricted to just documentation time. Documentation can happen in parallel or sequential to care time depending on case mix urgency and priorities for the shift. So, overall critical Care Time devoted to patient care services described in this note is  45  Minutes.   This time reflects time of care of this signee Dr Dorethia Cave which includ does not reflect procedure time, or teaching time or supervisory time of PA/NP/Med student/Med Resident etc but could involve care discussion time     Dr. Dorethia Cave, M.D., [facility].C.P Pulmonary and Critical Care Medicine Staff Physician, [facility] [facility] Pager: [phone], If no answer or between  15:00h - 7:00h: call [phone]  12/29/2023 3:39 PM   Labs   CBC: Recent Labs  Lab 12/23/23 0308 12/29/23 1045  WBC 10.5 23.8*  NEUTROABS  --  21.7*  HGB 9.1* 9.9*  HCT 29.7* 32.9*  MCV 85.8 88.0  PLT 310 281    Basic Metabolic Panel: Recent Labs  Lab 12/23/23 0308 12/24/23 1326 12/29/23 1045  NA 131* 130* 132*  K 4.4 4.4 3.8  CL 94* 94* 92*  CO2 21* 21* 22  GLUCOSE 173* 413* 251*  BUN 45* 62* 61*  CREATININE 4.65* 6.57* 5.94*  CALCIUM  8.6* 8.9 8.6*  MG 2.0  --   --   PHOS 4.3  --   --    GFR: Estimated Creatinine Clearance: 9.1 mL/min (A) (by C-G formula based on SCr of 5.94 mg/dL (H)). Recent  Labs  Lab 12/23/23 0308 12/29/23 1045 12/29/23 1206  WBC 10.5 23.8*  --   LATICACIDVEN  --  2.2* 2.1*    Liver Function Tests: Recent Labs  Lab 12/29/23 1045  AST 21  ALT 12  ALKPHOS 139*  BILITOT 0.7  PROT 6.0*  ALBUMIN  2.2*   No results for input(s): LIPASE, AMYLASE in the last 168 hours. No results for input(s): AMMONIA in the last 168 hours.  ABG    Component Value Date/Time   PHART 7.39 06/11/2022 2152   PCO2ART 49 (H) 06/11/2022 2152   PO2ART 110 (H) 06/11/2022 2152  HCO3 26.8 12/17/2023 1213   TCO2 32 07/31/2023 0702   ACIDBASEDEF 0.1 12/17/2023 1213   O2SAT 47.7 12/17/2023 1213     Coagulation Profile: Recent Labs  Lab 12/29/23 1206  INR 1.9*    Cardiac Enzymes: No results for input(s): CKTOTAL, CKMB, CKMBINDEX, TROPONINI in the last 168 hours.  HbA1C: Hgb A1c MFr Bld  Date/Time Value Ref Range Status  10/01/2023 01:37 PM 6.8 (H) 4.8 - 5.6 % Final    Comment:    (NOTE) Pre diabetes:          5.7%-6.4%  Diabetes:              >6.4%  Glycemic control for   <7.0% adults with diabetes   02/27/2023 12:40 PM 9.0 (H) <5.7 % of total Hgb Final    Comment:    For someone without known diabetes, a hemoglobin A1c value of 6.5% or greater indicates that they may have  diabetes and this should be confirmed with a follow-up  test. . For someone with known diabetes, a value <7% indicates  that their diabetes is well controlled and a value  greater than or equal to 7% indicates suboptimal  control. A1c targets should be individualized based on  duration of diabetes, age, comorbid conditions, and  other considerations. . Currently, no consensus exists regarding use of hemoglobin A1c for diagnosis of diabetes for children. .     CBG: Recent Labs  Lab 12/24/23 1622 12/24/23 2031 12/25/23 0736 12/25/23 1349 12/29/23 0956  GLUCAP 312* 283* 298* 242* 254*    Review of Systems:   x  Past Medical History:  She,  has a past  medical history of Allergy, Anemia, Anxiety, Arthritis, CAD (coronary artery disease), Carotid artery disease (HCC), CHF (congestive heart failure) (HCC), Chronic diastolic heart failure (HCC), Clotting disorder (HCC), Daily headache, Depression, Diabetic retinopathy (HCC), Dyslipidemia, ESRD (end stage renal disease) (HCC), Exertional shortness of breath, GERD (gastroesophageal reflux disease), History of blood transfusion, History of kidney stones, HTN (hypertension), Hypothyroidism, Myocardial infarction (HCC), Obesity, Osteoarthritis, Oxygen  deficiency, PAF (paroxysmal atrial fibrillation) (HCC), Peripheral neuropathy, PONV (postoperative nausea and vomiting), RBBB (right bundle branch block), Stroke (HCC), and Type II diabetes mellitus (HCC).   Surgical History:   Past Surgical History:  Procedure Laterality Date   A/V FISTULAGRAM Left 11/07/2022   Procedure: A/V Fistulagram;  Surgeon: Magda Debby SAILOR, MD;  Location: [facility] INVASIVE CV LAB;  Service: Cardiovascular;  Laterality: Left;   ABDOMINAL HYSTERECTOMY  1980's   AMPUTATION Right 02/24/2018   Procedure: RIGHT FOOT GREAT TOE AND 2ND TOE AMPUTATION;  Surgeon: Harden Jerona GAILS, MD;  Location: [facility] OR;  Service: Orthopedics;  Laterality: Right;   AMPUTATION Right 04/30/2018   Procedure: RIGHT TRANSMETATARSAL AMPUTATION;  Surgeon: Harden Jerona GAILS, MD;  Location: [facility] OR;  Service: Orthopedics;  Laterality: Right;   AMPUTATION Right 05/02/2022   Procedure: RIGHT BELOW KNEE AMPUTATION;  Surgeon: Harden Jerona GAILS, MD;  Location: [facility] OR;  Service: Orthopedics;  Laterality: Right;   APPLICATION OF WOUND VAC Right 06/13/2022   Procedure: APPLICATION OF WOUND VAC;  Surgeon: Harden Jerona GAILS, MD;  Location: [facility] OR;  Service: Orthopedics;  Laterality: Right;   APPLICATION OF WOUND VAC Left 11/14/2022   Procedure: APPLICATION OF WOUND VAC;  Surgeon: Harden Jerona GAILS, MD;  Location: [facility] OR;  Service: Orthopedics;  Laterality: Left;   AV FISTULA PLACEMENT Left 04/02/2022    Procedure: LEFT ARM ARTERIOVENOUS (AV) FISTULA CREATION;  Surgeon: Serene Gaile ORN, MD;  Location:  [facility] OR;  Service: Vascular;  Laterality: Left;  PERIPHERAL NERVE BLOCK   AV FISTULA PLACEMENT Right 04/27/2023   Procedure: RIGHT ARM BRACHIOBASILIC ARTERIOVENOUS (AV) FISTULA CREATION;  Surgeon: Magda Debby SAILOR, MD;  Location: [facility] OR;  Service: Vascular;  Laterality: Right;   BASCILIC VEIN TRANSPOSITION Left 07/31/2022   Procedure: LEFT ARM SECOND STAGE BASILIC VEIN TRANSPOSITION;  Surgeon: Serene Gaile ORN, MD;  Location: [facility] OR;  Service: Vascular;  Laterality: Left;   BIOPSY  05/27/2020   Procedure: BIOPSY;  Surgeon: Cindie Carlin POUR, DO;  Location: [facility]O [facility];  Service: Endoscopy;;   CATARACT EXTRACTION, BILATERAL Bilateral ?2013   COLONOSCOPY W/ POLYPECTOMY     COLONOSCOPY WITH PROPOFOL  N/A 03/13/2019   Procedure: COLONOSCOPY WITH PROPOFOL ;  Surgeon: Albertus Gordy HERO, MD;  Location: [facility] ENDOSCOPY;  Service: Gastroenterology;  Laterality: N/A;   CORONARY ANGIOPLASTY WITH STENT PLACEMENT  1999; 2007   1 + 1   ERCP N/A 02/03/2022   Procedure: ENDOSCOPIC RETROGRADE CHOLANGIOPANCREATOGRAPHY (ERCP);  Surgeon: Rollin Dover, MD;  Location: [facility] ENDOSCOPY;  Service: Gastroenterology;  Laterality: N/A;   ESOPHAGOGASTRODUODENOSCOPY N/A 02/12/2023   Procedure: ESOPHAGOGASTRODUODENOSCOPY (EGD);  Surgeon: Rollin Dover, MD;  Location: [facility] ENDOSCOPY;  Service: Gastroenterology;  Laterality: N/A;   ESOPHAGOGASTRODUODENOSCOPY (EGD) WITH PROPOFOL  N/A 03/13/2019   Procedure: ESOPHAGOGASTRODUODENOSCOPY (EGD) WITH PROPOFOL ;  Surgeon: Albertus Gordy HERO, MD;  Location: [facility] ENDOSCOPY;  Service: Gastroenterology;  Laterality: N/A;   ESOPHAGOGASTRODUODENOSCOPY (EGD) WITH PROPOFOL  N/A 05/27/2020   Procedure: ESOPHAGOGASTRODUODENOSCOPY (EGD) WITH PROPOFOL ;  Surgeon: Cindie Carlin POUR, DO;  [facility];  Service: Endoscopy;  Laterality: N/A;   ESOPHAGOGASTRODUODENOSCOPY (EGD) WITH PROPOFOL  N/A 09/03/2022   Procedure:  ESOPHAGOGASTRODUODENOSCOPY (EGD) WITH PROPOFOL ;  Surgeon: Rollin Dover, MD;  Location: [facility] ENDOSCOPY;  Service: Gastroenterology;  Laterality: N/A;   EYE SURGERY Bilateral    lazer   FLEXIBLE SIGMOIDOSCOPY N/A 05/23/2022   Procedure: FLEXIBLE SIGMOIDOSCOPY;  Surgeon: Rollin Dover, MD;  Location: [facility] ENDOSCOPY;  Service: Gastroenterology;  Laterality: N/A;   FLEXIBLE SIGMOIDOSCOPY N/A 05/24/2022   Procedure: FLEXIBLE SIGMOIDOSCOPY;  Surgeon: Federico Rosario BROCKS, MD;  Location: [facility] ENDOSCOPY;  Service: Gastroenterology;  Laterality: N/A;   FLEXIBLE SIGMOIDOSCOPY N/A 09/03/2022   Procedure: FLEXIBLE SIGMOIDOSCOPY;  Surgeon: Rollin Dover, MD;  Location: [facility] ENDOSCOPY;  Service: Gastroenterology;  Laterality: N/A;   HEMOSTASIS CLIP PLACEMENT  03/13/2019   Procedure: HEMOSTASIS CLIP PLACEMENT;  Surgeon: Albertus Gordy HERO, MD;  Location: [facility] ENDOSCOPY;  Service: Gastroenterology;;   HEMOSTASIS CLIP PLACEMENT  05/23/2022   Procedure: HEMOSTASIS CLIP PLACEMENT;  Surgeon: Rollin Dover, MD;  Location: [facility] ENDOSCOPY;  Service: Gastroenterology;;   HEMOSTASIS CONTROL  05/24/2022   Procedure: HEMOSTASIS CONTROL;  Surgeon: Federico Rosario BROCKS, MD;  Location: [facility] ENDOSCOPY;  Service: Gastroenterology;;   HOT HEMOSTASIS N/A 05/23/2022   Procedure: HOT HEMOSTASIS (ARGON PLASMA COAGULATION/BICAP);  Surgeon: Rollin Dover, MD;  Location: [facility] ENDOSCOPY;  Service: Gastroenterology;  Laterality: N/A;   I & D EXTREMITY Left 05/05/2022   Procedure: IRRIGATION AND DEBRIDEMENT LEFT ARM AV FISTULA;  Surgeon: Gretta Lonni PARAS, MD;  Location: [facility] OR;  Service: Vascular;  Laterality: Left;   I & D EXTREMITY N/A 11/14/2022   Procedure: IRRIGATION AND DEBRIDEMENT OF LOWER EXTREMITY WOUND;  Surgeon: Harden Jerona GAILS, MD;  [facility] OR;  Service: Orthopedics;  Laterality: N/A;   INSERTION OF DIALYSIS CATHETER Right 04/02/2022   Procedure: INSERTION OF TUNNELED DIALYSIS CATHETER;  Surgeon: Serene Gaile ORN, MD;  [facility]: [facility] OR;  Service:  Vascular;  Laterality: Right;  IR FLUORO GUIDE CV LINE RIGHT  08/10/2023   IR VENOCAVAGRAM IVC  08/10/2023   KNEE ARTHROSCOPY Left 10/25/2006   POLYPECTOMY  03/13/2019   Procedure: POLYPECTOMY;  Surgeon: Albertus Gordy HERO, MD;  Location: [facility] ENDOSCOPY;  Service: Gastroenterology;;   REMOVAL OF STONES  02/03/2022   Procedure: REMOVAL OF STONES;  Surgeon: Rollin Dover, MD;  Location: [facility] ENDOSCOPY;  Service: Gastroenterology;;   REVISON OF ARTERIOVENOUS FISTULA Left 08/20/2022   Procedure: REVISON OF LEFT ARM ARTERIOVENOUS FISTULA;  Surgeon: Serene Gaile ORN, MD;  [facility] OR;  Service: Vascular;  Laterality: Left;   RIGHT HEART CATH N/A 07/24/2017   Procedure: RIGHT HEART CATH;  Surgeon: Cherrie Toribio SAUNDERS, MD;  [facility] INVASIVE CV LAB;  Service: Cardiovascular;  Laterality: N/A;   RIGHT HEART CATHETERIZATION N/A 09/22/2013   Procedure: RIGHT HEART CATH;  Surgeon: Toribio SAUNDERS Cherrie, MD;  Location: [facility] CATH LAB;  Service: Cardiovascular;  Laterality: N/A;   SHOULDER ARTHROSCOPY WITH OPEN ROTATOR CUFF REPAIR Right 03/14/2014   Procedure: RIGHT SHOULDER ARTHROSCOPY WITH BICEPS RELEASE, OPEN SUBSCAPULA REPAIR, OPEN SUPRASPINATUS REPAIR.;  Surgeon: Cordella Glendia Hutchinson, MD;  Location: [facility] OR;  Service: Orthopedics;  Laterality: Right;   SPHINCTEROTOMY  02/03/2022   Procedure: SPHINCTEROTOMY;  Surgeon: Rollin Dover, MD;  Location: [facility] ENDOSCOPY;  Service: Gastroenterology;;   STUMP REVISION Right 06/13/2022   Procedure: REVISION RIGHT BELOW KNEE AMPUTATION;  Surgeon: Harden Jerona GAILS, MD;  Location: [facility] OR;  Service: Orthopedics;  Laterality: Right;   TEE WITHOUT CARDIOVERSION N/A 02/04/2022   Procedure: TRANSESOPHAGEAL ECHOCARDIOGRAM (TEE);  Surgeon: Cherrie Toribio SAUNDERS, MD;  Location: [facility] ENDOSCOPY;  Service: Cardiovascular;  Laterality: N/A;   THROMBECTOMY W/ EMBOLECTOMY Left 08/20/2022   Procedure: THROMBECTOMY OF LEFT ARM ARTERIOVENOUS FISTULA;  Surgeon: Serene Gaile ORN, MD;  Location: [facility] OR;  Service:  Vascular;  Laterality: Left;   TOE AMPUTATION Right 02/24/2018   GREAT TOE AND 2ND TOE AMPUTATION   TUBAL LIGATION  1970's   UPPER EXTREMITY VENOGRAPHY N/A 07/31/2023   Procedure: UPPER EXTREMITY VENOGRAPHY;  Surgeon: Magda Debby SAILOR, MD;  [facility] INVASIVE CV LAB;  Service: Cardiovascular;  Laterality: N/A;     Social History:   reports that she quit smoking about 26 years ago. Her smoking use included cigarettes. She started smoking about 58 years ago. She has a 96 pack-year smoking history. She has never used smokeless tobacco. She reports that she does not currently use alcohol . She reports that she does not use drugs.   Family History:  Her family history includes Heart attack (age of onset: 45) in her mother.   Allergies Allergies  Allergen Reactions   Codeine Nausea And Vomiting   Keflex  [Cephalexin ] Diarrhea   Macrobid  [Nitrofurantoin ] Diarrhea and Nausea And Vomiting

## 2023-12-29 NOTE — ED Notes (Signed)
 First set of [facility] drawn patient is a difficult stick antibiotics started due to not being able to get second set

## 2023-12-29 NOTE — Consult Note (Addendum)
 WOC Nurse Consult Note: this patient is known to WOC team from previous admission; last seen 12/18/2023 for unstageable sacral Pressure Injury and L leg ulcer with skin grafting followed by Dr. Harden; surgery did evaluate sacral wound on 12/18/2023 and deemed no need for surgical debridement; recommended local wound care Patient seen in Dr. Trude office 12/28/2023 for the L leg ulceration and ordered Vashe WTD dressings  Reason for Consult: sacral and L lower leg wound  Wound type: 1.  Unstageable Pressure Injury sacrum  2.  Full thickness L leg with recurrent ulceration s/p tissue grafting by Dr. Harden as above  Pressure Injury POA: Yes Measurement: see nursing flowsheet  Wound bed: sacrum 80% black eschar, 20% red moist; L leg 30% red 70% black/tan necrotic tissue  Drainage (amount, consistency, odor) purulent foul smelling from sacral wound per notes and bedside nurse  Periwound:mild erythema  Dressing procedure/placement/frequency: 1  Cleanse sacral wound with Vashe wound cleanser Soila [phone]) do not rinse and allow to air dry. Apply Dakin's moistened gauze to wound bed 2 times daily, cover with dry gauze/ABD pad and tape.  Use Dakin's x 3 days.  After 3 days of Dakin's  beginning 01/01/2024 will switch back to Santyl  for continued debridement.   2.  Cleanse L lower leg wound with Vashe Soila [phone]), do not rinse.  Apply Vashe moistened gauze/Kerlix to ulceration daily, cover with dry gauze and secure with Kerlix roll gauze anchored around foot.  Apply Ace bandage wrapped in same fashion as Kerlix for light compression.    Patient should remain on a low air loss mattress throughout hospitalization for pressure redistribution and moisture management.    Secure chat to MD with recommendation for PT  to evaluate this wound for possible hydrotherapy to help facilitate removal of necrotic tissue. OK per Dr. Geronimo, order placed.  POC discussed with bedside nurse.  WOC team will not follow.  Re-consult if further needs arise.   Thank you,     Powell Bar MSN, RN-BC, [employer]

## 2023-12-29 NOTE — Progress Notes (Signed)
 PHARMACY - ANTICOAGULATION CONSULT NOTE  Pharmacy Consult for Heparin  while Eliquis  on hold Indication: atrial fibrillation  Allergies  Allergen Reactions   Codeine Nausea And Vomiting   Keflex  [Cephalexin ] Diarrhea   Macrobid  [Nitrofurantoin ] Diarrhea and Nausea And Vomiting    Patient Measurements: Height: 5' 5 (165.1 cm) Weight: 81.7 kg (180 lb 1.9 oz) IBW/kg (Calculated) : 57 HEPARIN  DW (KG): 76.3  Vital Signs: Temp: 98.4 F (36.9 C) (08/05 1520) Temp Source: Oral (08/05 1520) BP: 108/85 (08/05 1345) Pulse Rate: 103 (08/05 1345)  Labs: Recent Labs    12/29/23 1045 12/29/23 1206  HGB 9.9*  --   HCT 32.9*  --   PLT 281  --   LABPROT  --  22.8*  INR  --  1.9*  CREATININE 5.94*  --     Estimated Creatinine Clearance: 8.8 mL/min (A) (by C-G formula based on SCr of 5.94 mg/dL (H)).   Medical History: Past Medical History:  Diagnosis Date   Allergy    Anemia    hx   Anxiety    Arthritis    generalized (03/15/2014)   CAD (coronary artery disease)    MI in 2000 - MI  2007 - treated bare metal stent (no nuclear since then as 9/11)   Carotid artery disease (HCC)    CHF (congestive heart failure) (HCC)    Chronic diastolic heart failure (HCC)    a) ECHO (08/2013) EF 55-60% and RV function nl b) RHC (08/2013) RA 4, RV 30/5/7, PA 25/10 (16), PCWP 7, Fick CO/CI 6.3/2.7, PVR 1.5 WU, PA 61 and 66%   Clotting disorder (HCC)    Daily headache    ~ every other day; since I fell in June (03/15/2014)   Depression    Diabetic retinopathy (HCC)    Dyslipidemia    ESRD (end stage renal disease) (HCC)    Dialysis on Tues Thurs Sat   Exertional shortness of breath    GERD (gastroesophageal reflux disease)    History of blood transfusion    History of kidney stones    HTN (hypertension)    Hypothyroidism    Myocardial infarction (HCC)    Obesity    Osteoarthritis    Oxygen  deficiency    PAF (paroxysmal atrial fibrillation) (HCC)    Peripheral neuropathy     bilateral feet/hands   PONV (postoperative nausea and vomiting)    RBBB (right bundle branch block)    Old   Stroke (HCC)    mini strokes   Type II diabetes mellitus (HCC)    Type II, Barnet libre left upper arm. patient has omnipod insulin  pump with Novolin R Insulin     Medications:  Scheduled:   amiodarone   200 mg Oral Daily   atorvastatin   80 mg Oral Daily   [START ON 01/07/2024] calcitRIOL   2.25 mcg Oral Q T,Th,Sa-HD   Chlorhexidine  Gluconate Cloth  6 each Topical Daily   collagenase    Topical Daily   folic acid   1 mg Oral Daily   insulin  aspart  0-20 Units Subcutaneous Q4H   [START ON 12/30/2023] levothyroxine   50 mcg Oral QAC breakfast   midodrine   5 mg Oral Q8H   pantoprazole   40 mg Oral Daily   polyethylene glycol  17 g Oral Daily   sevelamer  carbonate  1,600 mg Oral TID WC   Infusions:   sodium chloride      sodium chloride      lactated ringers  40 mL/hr at 12/29/23 1620   meropenem  (MERREM ) IV 1  g (12/29/23 1623)   norepinephrine  (LEVOPHED ) Adult infusion 2 mcg/min (12/29/23 1608)   [START ON 01/04/2024] vancomycin      vancomycin      PRN: acetaminophen , albuterol , docusate sodium , polyethylene glycol, sevelamer  carbonate  Assessment: 73 yo female on chronic Eliquis  for hx afib. Admitted now with recurrent septic shock. Pharmacy consulted to dose IV heparin  while Eliquis  on hold.  Last dose of Eliquis  currently unknown, but no administered since admitted inpatient this AM ~9am  Previously on heparin  rate of 1700 units/hr, and aPTT was just below therapeutic range.  Goal of Therapy:  Heparin  level 0.3-0.7 units/ml aPTT 66-102 seconds Monitor platelets by anticoagulation protocol: Yes   Plan:  Begin IV heparin  at 1700 units/hr (no bolus) Check aPTT and HL in 8hrs and daily until correlate Daily CBC Monitor for signs/symptoms of bleeding  Rocky Slade, PharmD, BCPS 12/29/2023,4:29 PM  Please check AMION for all [facility] Pharmacy phone numbers After 10:00 PM, call  Main Pharmacy [phone]

## 2023-12-29 NOTE — Progress Notes (Signed)
 CCM INTAKE CALL   Callf rom [facility] 12/29/2023 12:11 PM Nena MARLA Clarity is a ESRD patient  HD patient. T . Thu SAt patient Baseline SBP 92- 120s  has a past medical history of Allergy, Anemia, Anxiety, Arthritis, CAD (coronary artery disease), Carotid artery disease (HCC), CHF (congestive heart failure) (HCC), Chronic diastolic heart failure (HCC), Clotting disorder (HCC), Daily headache, Depression, Diabetic retinopathy (HCC), Dyslipidemia, ESRD (end stage renal disease) (HCC), Exertional shortness of breath, GERD (gastroesophageal reflux disease), History of blood transfusion, History of kidney stones, HTN (hypertension), Hypothyroidism, Myocardial infarction (HCC), Obesity, Osteoarthritis, Oxygen  deficiency, PAF (paroxysmal atrial fibrillation) (HCC), Peripheral neuropathy, PONV (postoperative nausea and vomiting), RBBB (right bundle branch block), Stroke (HCC), and Type II diabetes mellitus (HCC).   Last week septic admit to [facility] and dishagd 01/05/24 for wound infection - sacral decub Now in ER with febrile, tacyy and dry - very dehdyrated looing Was tough to get IV but no whas PIV and MAPS running 54- 79 At ER somewhat confused intermittently  Baseline ef 30%      12/29/2023   11:53 AM 12/29/2023   11:52 AM 12/29/2023   11:51 AM  Vitals with BMI  Systolic   87  Diastolic   49  Pulse 79 80 80     LABS    PULMONARY No results for input(s): PHART, PCO2ART, PO2ART, HCO3, TCO2, O2SAT in the last 168 hours.  Invalid input(s): PCO2, PO2  CBC Recent Labs  Lab 12/23/23 0308 12/29/23 1045  HGB 9.1* 9.9*  HCT 29.7* 32.9*  WBC 10.5 23.8*  PLT 310 281    COAGULATION No results for input(s): INR in the last 168 hours.  CARDIAC  No results for input(s): TROPONINI in the last 168 hours. No results for input(s): PROBNP in the last 168 hours.   CHEMISTRY Recent Labs  Lab 12/23/23 0308 12/24/23 1326 12/29/23 1045  NA 131* 130* 132*  K 4.4 4.4 3.8   CL 94* 94* 92*  CO2 21* 21* 22  GLUCOSE 173* 413* 251*  BUN 45* 62* 61*  CREATININE 4.65* 6.57* 5.94*  CALCIUM  8.6* 8.9 8.6*  MG 2.0  --   --   PHOS 4.3  --   --    Estimated Creatinine Clearance: 9.1 mL/min (A) (by C-G formula based on SCr of 5.94 mg/dL (H)).   LIVER Recent Labs  Lab 12/29/23 1045  AST 21  ALT 12  ALKPHOS 139*  BILITOT 0.7  PROT 6.0*  ALBUMIN  2.2*     INFECTIOUS Recent Labs  Lab 12/29/23 1045  LATICACIDVEN 2.2*     ENDOCRINE CBG (last 3)  Recent Labs    12/29/23 0956  GLUCAP 254*         IMAGING x48h  - image(s) personally visualized  -   highlighted in bold DG Chest Port 1 View Result Date: 12/29/2023 CLINICAL DATA:  Altered mental status. Admitted 1 week ago for sepsis. Hypotensive. Missed dialysis today. Ex-smoker. EXAM: PORTABLE CHEST 1 VIEW COMPARISON:  12/17/2023. Chest, abdomen and pelvis CTA dated 08/05/2023. FINDINGS: Again demonstrated is a poor inspiration with elevation of the right hemidiaphragm. Stable borderline enlarged cardiac silhouette. Tortuous and partially calcified thoracic aorta. Stable right jugular double-lumen catheter with its tip in the region of the superior cavoatrial junction. Thoracic spine degenerative changes. IMPRESSION: 1. No acute abnormality. 2. Borderline cardiomegaly. Electronically Signed   By: Elspeth Bathe M.D.   On: 12/29/2023 11:37    A Severe sepsis High risk for decomp  Has a HD cath in right chest -   Plan  - accept to ICU at [facility] - give fluids (tough IV)  - neo/levopjed via PIV OR EDP to talk to renal and access the HD cath     SIGNATURE    Dr. Dorethia Cave, M.D., F.C.C.P,  Pulmonary and Critical Care Medicine Staff Physician, [facility] Center Director - Interstitial Lung Disease  Program  Pulmonary Fibrosis [facility] at [facility] [place], [place], [address]   Pager: [phone], If no answer  -> Check AMION or Try [phone] Telephone (clinical office): [phone] Telephone (research): [phone]  12:17 PM 12/29/2023

## 2023-12-29 NOTE — Progress Notes (Signed)
 Pharmacy Antibiotic Note  Susan [name] is a 73 y.o. female admitted on 12/29/2023 with sepsis.  Pharmacy has been consulted for Vancomycin  and Meropenem  dosing. H/o ESBL producer in the past. Noted recent hospitalization for sacral decub wound infection and proteus UTI- just d/c 8/1.  Pt ESRD - T/T/S HD  Plan: Meropenem  1gm IV q24h Vancomycin  1500mg  IV load running now then 1000 mg qHD Will f/u HD schedule, micro data, and pt's clinical condition Vanc levels prn   Height: 5' 5 (165.1 cm) Weight: 81.7 kg (180 lb 1.9 oz) IBW/kg (Calculated) : 57  Temp (24hrs), Avg:100.2 F (37.9 C), Min:98.1 F (36.7 C), Max:100.6 F (38.1 C)  Recent Labs  Lab 12/23/23 0308 12/24/23 1326 12/29/23 1045 12/29/23 1206  WBC 10.5  --  23.8*  --   CREATININE 4.65* 6.57* 5.94*  --   LATICACIDVEN  --   --  2.2* 2.1*    Estimated Creatinine Clearance: 8.8 mL/min (A) (by C-G formula based on SCr of 5.94 mg/dL (H)).    Allergies  Allergen Reactions   Codeine Nausea And Vomiting   Keflex  [Cephalexin ] Diarrhea   Macrobid  [Nitrofurantoin ] Diarrhea and Nausea And Vomiting    Antimicrobials this admission: 8/5 Rocephin  x 1 8/5 Vanc >>  8/5 Meropenem  >>   Microbiology results: 8/5 BCx:  8/5 UCx:    MRSA PCR:   Thank you for allowing pharmacy to be a part of this patient's care.  Vito Ralph, PharmD, BCPS Please see amion for complete clinical pharmacist phone list 12/29/2023 3:28 PM

## 2023-12-30 ENCOUNTER — Inpatient Hospital Stay (HOSPITAL_COMMUNITY)

## 2023-12-30 ENCOUNTER — Other Ambulatory Visit (HOSPITAL_COMMUNITY)

## 2023-12-30 ENCOUNTER — Other Ambulatory Visit (HOSPITAL_COMMUNITY): Payer: Self-pay

## 2023-12-30 ENCOUNTER — Other Ambulatory Visit: Payer: Self-pay

## 2023-12-30 DIAGNOSIS — Z7189 Other specified counseling: Secondary | ICD-10-CM

## 2023-12-30 DIAGNOSIS — N186 End stage renal disease: Secondary | ICD-10-CM | POA: Diagnosis not present

## 2023-12-30 DIAGNOSIS — G9341 Metabolic encephalopathy: Secondary | ICD-10-CM

## 2023-12-30 DIAGNOSIS — Z992 Dependence on renal dialysis: Secondary | ICD-10-CM

## 2023-12-30 DIAGNOSIS — Z66 Do not resuscitate: Secondary | ICD-10-CM

## 2023-12-30 DIAGNOSIS — Z515 Encounter for palliative care: Secondary | ICD-10-CM

## 2023-12-30 DIAGNOSIS — R6521 Severe sepsis with septic shock: Secondary | ICD-10-CM | POA: Diagnosis not present

## 2023-12-30 DIAGNOSIS — A419 Sepsis, unspecified organism: Secondary | ICD-10-CM | POA: Diagnosis not present

## 2023-12-30 DIAGNOSIS — L89159 Pressure ulcer of sacral region, unspecified stage: Secondary | ICD-10-CM

## 2023-12-30 LAB — BLOOD CULTURE ID PANEL (REFLEXED) - BCID2
A.calcoaceticus-baumannii: NOT DETECTED
Bacteroides fragilis: NOT DETECTED
Candida albicans: NOT DETECTED
Candida auris: NOT DETECTED
Candida glabrata: NOT DETECTED
Candida krusei: NOT DETECTED
Candida parapsilosis: NOT DETECTED
Candida tropicalis: NOT DETECTED
Cryptococcus neoformans/gattii: NOT DETECTED
Enterobacter cloacae complex: NOT DETECTED
Enterobacterales: NOT DETECTED
Enterococcus Faecium: DETECTED — AB
Enterococcus faecalis: NOT DETECTED
Escherichia coli: NOT DETECTED
Haemophilus influenzae: NOT DETECTED
Klebsiella aerogenes: NOT DETECTED
Klebsiella oxytoca: NOT DETECTED
Klebsiella pneumoniae: NOT DETECTED
Listeria monocytogenes: NOT DETECTED
Neisseria meningitidis: NOT DETECTED
Proteus species: NOT DETECTED
Pseudomonas aeruginosa: NOT DETECTED
Salmonella species: NOT DETECTED
Serratia marcescens: NOT DETECTED
Staphylococcus aureus (BCID): NOT DETECTED
Staphylococcus epidermidis: NOT DETECTED
Staphylococcus lugdunensis: NOT DETECTED
Staphylococcus species: NOT DETECTED
Stenotrophomonas maltophilia: NOT DETECTED
Streptococcus agalactiae: NOT DETECTED
Streptococcus pneumoniae: NOT DETECTED
Streptococcus pyogenes: NOT DETECTED
Streptococcus species: NOT DETECTED
Vancomycin resistance: DETECTED — AB

## 2023-12-30 LAB — BASIC METABOLIC PANEL WITH GFR
Anion gap: 20 — ABNORMAL HIGH (ref 5–15)
Anion gap: 20 — ABNORMAL HIGH (ref 5–15)
Anion gap: 18 — ABNORMAL HIGH (ref 5–15)
BUN: 59 mg/dL — ABNORMAL HIGH (ref 8–23)
BUN: 61 mg/dL — ABNORMAL HIGH (ref 8–23)
BUN: 62 mg/dL — ABNORMAL HIGH (ref 8–23)
CO2: 18 mmol/L — ABNORMAL LOW (ref 22–32)
CO2: 17 mmol/L — ABNORMAL LOW (ref 22–32)
CO2: 20 mmol/L — ABNORMAL LOW (ref 22–32)
Calcium: 8.6 mg/dL — ABNORMAL LOW (ref 8.9–10.3)
Calcium: 8.5 mg/dL — ABNORMAL LOW (ref 8.9–10.3)
Calcium: 8.5 mg/dL — ABNORMAL LOW (ref 8.9–10.3)
Chloride: 94 mmol/L — ABNORMAL LOW (ref 98–111)
Chloride: 94 mmol/L — ABNORMAL LOW (ref 98–111)
Chloride: 94 mmol/L — ABNORMAL LOW (ref 98–111)
Creatinine, Ser: 6.12 mg/dL — ABNORMAL HIGH (ref 0.44–1.00)
Creatinine, Ser: 6.18 mg/dL — ABNORMAL HIGH (ref 0.44–1.00)
Creatinine, Ser: 6.41 mg/dL — ABNORMAL HIGH (ref 0.44–1.00)
GFR, Estimated: 7 mL/min — ABNORMAL LOW (ref >60)
GFR, Estimated: 7 mL/min — ABNORMAL LOW (ref >60)
GFR, Estimated: 6 mL/min — ABNORMAL LOW (ref >60)
Glucose, Bld: 90 mg/dL (ref 70–99)
Glucose, Bld: 123 mg/dL — ABNORMAL HIGH (ref 70–99)
Glucose, Bld: 159 mg/dL — ABNORMAL HIGH (ref 70–99)
Potassium: 4.2 mmol/L (ref 3.5–5.1)
Potassium: 4.6 mmol/L (ref 3.5–5.1)
Potassium: 4 mmol/L (ref 3.5–5.1)
Sodium: 132 mmol/L — ABNORMAL LOW (ref 135–145)
Sodium: 131 mmol/L — ABNORMAL LOW (ref 135–145)
Sodium: 132 mmol/L — ABNORMAL LOW (ref 135–145)

## 2023-12-30 LAB — GLUCOSE, CAPILLARY
Glucose-Capillary: 201 mg/dL — ABNORMAL HIGH (ref 70–99)
Glucose-Capillary: 152 mg/dL — ABNORMAL HIGH (ref 70–99)
Glucose-Capillary: 91 mg/dL (ref 70–99)
Glucose-Capillary: 136 mg/dL — ABNORMAL HIGH (ref 70–99)

## 2023-12-30 LAB — APTT: aPTT: 93 s — ABNORMAL HIGH (ref 24–36)

## 2023-12-30 LAB — BLOOD GAS, VENOUS
Acid-base deficit: 2 mmol/L (ref 0.0–2.0)
Bicarbonate: 23.7 mmol/L (ref 20.0–28.0)
O2 Saturation: 75.1 %
Patient temperature: 38.4
pCO2, Ven: 46 mmHg (ref 44–60)
pH, Ven: 7.33 (ref 7.25–7.43)
pO2, Ven: 50 mmHg — ABNORMAL HIGH (ref 32–45)

## 2023-12-30 LAB — MAGNESIUM: Magnesium: 1.9 mg/dL (ref 1.7–2.4)

## 2023-12-30 LAB — URINE CULTURE: Culture: <10000 — AB

## 2023-12-30 LAB — PHOSPHORUS: Phosphorus: 5.2 mg/dL — ABNORMAL HIGH (ref 2.5–4.6)

## 2023-12-30 LAB — HEPARIN LEVEL (UNFRACTIONATED): Heparin Unfractionated: >1.1 [IU]/mL — ABNORMAL HIGH (ref 0.30–0.70)

## 2023-12-30 LAB — TSH: TSH: 6.625 u[IU]/mL — ABNORMAL HIGH (ref 0.350–4.500)

## 2023-12-30 LAB — PROCALCITONIN: Procalcitonin: 3.87 ng/mL

## 2023-12-30 LAB — BETA-HYDROXYBUTYRIC ACID: Beta-Hydroxybutyric Acid: 0.16 mmol/L (ref 0.05–0.27)

## 2023-12-30 MED ORDER — DIAZEPAM 5 MG/ML IJ SOLN: 2.5000 mg | INTRAMUSCULAR | Status: DC | PRN

## 2023-12-30 MED ORDER — LORAZEPAM 0.5 MG PO TABS: 0.5000 mg | ORAL_TABLET | ORAL | 0 refills | Status: DC

## 2023-12-30 MED ORDER — NOREPINEPHRINE 16 MG/250ML-% IV SOLN
0.0000 ug/min | INTRAVENOUS | Status: DC
  Administered 2023-12-30: 6 ug/min via INTRAVENOUS
  Filled 2023-12-30: qty 250

## 2023-12-30 MED ORDER — MORPHINE SULFATE 20 MG/5ML PO SOLN: 5.0000 mg | ORAL | Status: DC

## 2023-12-30 MED ORDER — MORPHINE SULFATE 20 MG/5ML PO SOLN: 5.0000 mg | ORAL | 0 refills | Status: DC

## 2023-12-30 MED ORDER — LORAZEPAM 0.5 MG PO TABS: 0.5000 mg | ORAL_TABLET | ORAL | Status: DC

## 2023-12-30 MED ORDER — GLYCOPYRROLATE 0.2 MG/ML IJ SOLN: 0.4000 mg | INTRAMUSCULAR | Status: DC | PRN

## 2023-12-30 MED ORDER — HYDROCORTISONE SOD SUC (PF) 100 MG IJ SOLR: 100.0000 mg | Freq: Three times a day (TID) | INTRAMUSCULAR | Status: DC

## 2023-12-30 MED ORDER — MORPHINE SULFATE 20 MG/5ML PO SOLN: 5.0000 mg | ORAL | 0 refills | Status: AC | PRN

## 2023-12-30 MED ORDER — FENTANYL CITRATE PF 50 MCG/ML IJ SOSY: 100.0000 ug | PREFILLED_SYRINGE | INTRAMUSCULAR | Status: DC

## 2023-12-30 MED ORDER — BIOTENE DRY MOUTH MT LIQD: 15.0000 mL | OROMUCOSAL | Status: DC | PRN

## 2023-12-30 MED ORDER — LORAZEPAM 0.5 MG PO TABS
0.5000 mg | ORAL_TABLET | ORAL | 0 refills | Status: DC
  Filled 2023-12-30: qty 45, 15d supply, fill #0

## 2023-12-30 MED ORDER — FENTANYL CITRATE PF 50 MCG/ML IJ SOSY: 50.0000 ug | PREFILLED_SYRINGE | INTRAMUSCULAR | Status: DC | PRN

## 2023-12-30 MED ORDER — MORPHINE SULFATE 20 MG/5ML PO SOLN: 5.0000 mg | ORAL | Status: DC | PRN

## 2023-12-30 MED ORDER — NOREPINEPHRINE 16 MG/250ML-% IV SOLN: 0.0000 ug/min | INTRAVENOUS | Status: DC

## 2023-12-30 MED ORDER — FENTANYL CITRATE PF 50 MCG/ML IJ SOSY
50.0000 ug | PREFILLED_SYRINGE | INTRAMUSCULAR | Status: DC | PRN
  Administered 2023-12-30: 50 ug via INTRAVENOUS
  Filled 2023-12-30: qty 1

## 2023-12-30 MED ORDER — FENTANYL CITRATE PF 50 MCG/ML IJ SOSY
100.0000 ug | PREFILLED_SYRINGE | INTRAMUSCULAR | Status: DC
  Administered 2023-12-30: 100 ug via INTRAVENOUS
  Filled 2023-12-30: qty 2

## 2023-12-30 MED ORDER — MORPHINE SULFATE 20 MG/5ML PO SOLN: 5.0000 mg | ORAL | 0 refills | Status: DC | PRN

## 2023-12-30 MED ORDER — MORPHINE SULFATE (CONCENTRATE) 20 MG/ML PO SOLN
5.2000 mg | ORAL | 0 refills | Status: DC | PRN
  Filled 2023-12-30: qty 24, 5d supply, fill #0

## 2023-12-30 MED ORDER — POLYVINYL ALCOHOL 1.4 % OP SOLN: 1.0000 [drp] | Freq: Four times a day (QID) | OPHTHALMIC | Status: DC | PRN

## 2023-12-30 MED ORDER — MORPHINE SULFATE 20 MG/5ML PO SOLN: 5.0000 mg | ORAL | 0 refills | Status: AC

## 2023-12-30 MED ORDER — DIPHENHYDRAMINE HCL 25 MG PO CAPS: 25.0000 mg | ORAL_CAPSULE | Freq: Four times a day (QID) | ORAL | Status: DC | PRN

## 2023-12-30 NOTE — Progress Notes (Signed)
 Pt is now transitioning to comfort care. No further dialysis. Will sign off.  Myer Fret  MD  [facility] 12/30/2023, 1:00 PM  Inpatient medications:  fentaNYL  (SUBLIMAZE ) injection  100 mcg Intravenous Q4H    norepinephrine  (LEVOPHED ) Adult infusion     acetaminophen , antiseptic oral rinse, artificial tears, diazepam , fentaNYL  (SUBLIMAZE ) injection, glycopyrrolate 

## 2023-12-30 NOTE — Progress Notes (Signed)
 [facility] [phone] [facility] Liaison Note  Received request from [facility] for hospice services at home after discharge. Spoke with patient's daughter and husband to initiate education related to hospice philosophy, services and team approach to care. Family verbalized understanding of information given. Per discussion, the plan is for discharge home today.   DME needs discussed. Patient has the following equipment in the home: hospital bed, hoyer lift. Family requests the following equipment for delivery:none  Please send signed and completed DNR home with patient/family. Please provide prescriptions at discharge as needed to ensure ongoing symptom management.  [facility] information and contact numbers given to husband. Please call with any concerns.  Thank you for the opportunity to participate in this patient's care.   Eleanor Nail, LPN [facility] Liaison [phone]

## 2023-12-30 NOTE — Progress Notes (Signed)
 The patient was moved into comfort care. Therefore nurse advised the exam was not needed

## 2023-12-30 NOTE — Discharge Summary (Signed)
 Physician Discharge Summary   Patient ID: Susan [name] MRN: [id number] DOB/AGE: [birth year] 73 y.o.  Admit date: 12/29/2023 Discharge date: 12/30/2023                     Discharge Plan by Diagnosis   Recurrent septic shock. Source likely urinary tract but also possibly sacral decub ulcer (per surgery, has progressed since last admission). Also possibly LLE venous ulcer or HD cath site, but both doubtful. Of note, BCx's prelim growing GPC's. 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 Acute metabolic encephalopathy 2/2 sepsis. Hx anxiety and depression. H/o AF, HFrEF  (30-35%), CAD, HLD and chronic hypotension (on midodrine ). ESRD TTS (Last iHD on 8/2). Hyponatremia. AGMA. Hx hypothyroidism. Anemia of chronic disease Hx GERD.  Discussion: PMT has had an extensive bedside discussion with Susan [name]'s family including her husband, Susan [name] regarding the current circumstances, poor prognosis, and likely poor quality of life for her. They also discussed the patient's prior wishes under circumstances such as this.  The family has decided to discontinue all non-comfort medications and interventions and to offer full comfort care for her. She is currently on a norepinephrine  infusion for her shock and we have not been able to wean this. The family has desperately requested continuation of NE only to facilitate transfer home so that pt could have a peaceful passing at her home surrounded by family. Home hospice team and physician has agreed to this plan. NE will remain on to facilitate safe transport home and upon arrival to pt's home, home hospice RN will assist with discontinuing NE and initiating comfort meds as needed. Emotional support was provided and all questions have been answered.   Discharge Summary   Susan [name] is a 73 y.o. y/o female  who resides at home and is cared for by family.  She has sytolic CHF since Oct 2022 (ef 30%) Mostly WC bound,  requires assist w/ ADLs, h/o poorly controlled DM w/ neuropathy, ESRD on TTS, recurrent infections (including ESBL producing UTI), additionally has h/o CAD, HFrEF, prior stroke, chronic Sacral wound (last CT December 17 2023, no obvious abscess (treating w/ chemical debridement via WON-> Santyl ) , prior BKA of right LE and chronic venous diease w/ LLE ulcer, Vashe dressing changes to the left leg daily plus Ace compressive wrap.    Was just discharged from hospital 8/1 after being admitted once again for sepsis and septic shock 2/2 proteus UTI and possible Left lower extremity chronic wound infection +/- infected sacral wound. Completed IV antibiotics home under the care of her husband.    Seen in ortho office 8/4 noted to have recurrent ulcer laterally. The ulcer measures 9 x 5 cm with no purulence no drainage the wound is dry with large eschar. She was awake and oriented w/ no increased efforts noted on this visit.    Presents to ER 8/5 with mental status change, found to have initial BP 74/44. Pt not able to get last iHD treatment on day of admit. Initial eval: glucose 254, Na 132, BUN 61, SCr 5.94, bicarb 22, K 3.8, lactate 2.2, WBC 23.8 (was 10.5 just 6 days prior),  Urine dirty appearing, INR 1.9, CXR with low volume and no obvious infiltrates.  She was started on Norepinephrine  and antibiotics (Vanc/Merrem ) and was transferred from [facility] to [facility] ICU for further evaluation and management. She has a known sacral decub ulcer that was evaluated by general surgery with plans for debridement  on 8/6.  Palliative Medicine met with husband, Susan [name] on 12/30/23. Susan [name] explained and expressed desire to transition to full comfort care given her recent significant decline in quality of life and the fact that pt herself had expressed similar wishes in the past under circumstances such as this. Family requested transfer to pt's home to allow for peaceful passing in her home. This was discussed with hospice team and  physician who agreed to the request.  On 12/30/23, she was discharged home with current vasopressors ongoing (Norepinephrine ) to allow for safe transfer home. Upon arrival home, vasopressors will be discontinued and comfort meds will be offered as needed per home hospice RN.          Objective:  Blood pressure (!) 102/38, pulse 79, temperature (!) 101.1 F (38.4 C), resp. rate 10, height 5' 5 (1.651 m), weight 84.5 kg, SpO2 98%.        Intake/Output Summary (Last 24 hours) at 12/30/2023 1512 Last data filed at 12/30/2023 1300 Gross per 24 hour  Intake 2297.36 ml  Output 25 ml  Net 2272.36 ml   Filed Weights   12/29/23 1002 12/29/23 1520 12/30/23 0500  Weight: 88 kg 81.7 kg 84.5 kg    Physical Examination: General: chronically ill appearing female, in NAD.  HENT: [place]/AT. MM dry. Lungs: Normal effort. CTAB. Cardiovascular: IRIR, no M/R/G. Abdomen: [place]. BS x 4, S/NT/ND. Extremities: Rt BKA. LLE skin flaky Neuro: Intermittently confused but once aroused is oriented to person and place. MAE's.   Discharge Labs:  BMET Recent Labs  Lab 12/29/23 1605 12/29/23 2133 12/30/23 0101 12/30/23 0443 12/30/23 0857  NA 130* 131* 132* 131* 132*  K 3.8 4.2 4.2 4.6 4.0  CL 93* 93* 94* 94* 94*  CO2 20* 18* 18* 17* 20*  GLUCOSE 223* 122* 90 123* 159*  BUN 58* 58* 59* 61* 62*  CREATININE 6.00* 5.99* 6.12* 6.18* 6.41*  CALCIUM  8.5* 8.4* 8.6* 8.5* 8.5*  MG  --   --   --  1.9  --   PHOS  --   --   --  5.2*  --     CBC Recent Labs  Lab 12/29/23 1045  HGB 9.9*  HCT 32.9*  WBC 23.8*  PLT 281    Anti-Coagulation Recent Labs  Lab 12/29/23 1206  INR 1.9*    Discharge Instructions     No wound care   Complete by: As directed    No wound care   Complete by: As directed       Allergies as of 12/30/2023       Reactions   Codeine Nausea And Vomiting   Keflex  [cephalexin ] Diarrhea   Macrobid  [nitrofurantoin ] Diarrhea, Nausea And Vomiting        Medication List      STOP taking these medications    acetaminophen  500 MG tablet Commonly known as: TYLENOL    albuterol  108 (90 Base) MCG/ACT inhaler Commonly known as: VENTOLIN  HFA   amiodarone  200 MG tablet Commonly known as: PACERONE    apixaban  5 MG Tabs tablet Commonly known as: ELIQUIS    atorvastatin  80 MG tablet Commonly known as: LIPITOR    calcitRIOL  0.25 MCG capsule Commonly known as: ROCALTROL    clonazePAM  0.5 MG disintegrating tablet Commonly known as: KLONOPIN    Dialyvite 800-Zinc  15 0.8 MG Tabs   folic acid  1 MG tablet Commonly known as: FOLVITE    insulin  aspart 100 UNIT/ML injection Commonly known as: novoLOG    lactulose  10 GM/15ML solution Commonly known as: CHRONULAC   Lantus  SoloStar 100 UNIT/ML Solostar Pen Generic drug: insulin  glargine   levothyroxine  50 MCG tablet Commonly known as: SYNTHROID    MELATONIN PO   methocarbamol  750 MG tablet Commonly known as: ROBAXIN    midodrine  10 MG tablet Commonly known as: PROAMATINE    nitroGLYCERIN  0.4 MG SL tablet Commonly known as: NITROSTAT    nystatin  cream Commonly known as: MYCOSTATIN    ofloxacin 0.3 % ophthalmic solution Commonly known as: OCUFLOX   Omnipod 5 DexG7G6 Pods Gen 5 Misc   oxyCODONE -acetaminophen  5-325 MG tablet Commonly known as: PERCOCET/ROXICET   OXYGEN    pantoprazole  40 MG tablet Commonly known as: PROTONIX    polyethylene glycol 17 g packet Commonly known as: MIRALAX  / GLYCOLAX    prednisoLONE acetate 1 % ophthalmic suspension Commonly known as: PRED FORTE   pregabalin  150 MG capsule Commonly known as: LYRICA    sevelamer  carbonate 800 MG tablet Commonly known as: RENVELA    venlafaxine  XR 75 MG 24 hr capsule Commonly known as: EFFEXOR -XR   VITAMIN C  PO   ZINC  PO       TAKE these medications    antiseptic oral rinse Liqd Apply 15 mLs topically as needed for dry mouth.   artificial tears ophthalmic solution Place 1 drop into both eyes 4 (four) times daily as needed  for dry eyes.   diazepam  5 MG/ML injection Commonly known as: VALIUM  Inject 0.5 mLs (2.5 mg total) into the vein every 4 (four) hours as needed.   diphenhydrAMINE  25 mg capsule Commonly known as: Benadryl  Allergy Take 1 capsule (25 mg total) by mouth every 6 (six) hours as needed.   fentaNYL  50 MCG/ML injection Commonly known as: SUBLIMAZE  Inject 1-2 mLs (50-100 mcg total) into the vein every 2 (two) hours as needed (Dyspnea/Pain).   fentaNYL  50 MCG/ML injection Commonly known as: SUBLIMAZE  Inject 2 mLs (100 mcg total) into the vein every 4 (four) hours.   glycopyrrolate  0.2 MG/ML injection Commonly known as: ROBINUL  Inject 2 mLs (0.4 mg total) into the vein every 4 (four) hours as needed (Secretion(s)).   LORazepam  0.5 MG tablet Commonly known as: ATIVAN  Take 1 tablet (0.5 mg total) by mouth every 4 (four) hours.   morphine  20 MG/5ML solution Take 1.3 mLs (5.2 mg total) by mouth every 2 (two) hours as needed for pain.   morphine  20 MG/5ML solution Take 1.3 mLs (5.2 mg total) by mouth every 4 (four) hours.   norepinephrine  16-5 MG/250ML-% Soln Commonly known as: LEVOPHED  Inject 0-40 mcg/min into the vein continuous.         Disposition: Home with hospice.   Discharge Condition:  Susan [name] has met maximum benefit of inpatient care and is medically stable and cleared for discharge.   Time spent on discharge: Greater than 30 minutes.    Sammi Gore, PA - [facility] For pager details, please see AMION If no response to pager, please call [phone] until 7:00 PM After 7:00 PM, please call Elink at [phone] 12/30/2023, 3:12 PM

## 2023-12-30 NOTE — Progress Notes (Signed)
 PHARMACY - PHYSICIAN COMMUNICATION CRITICAL VALUE ALERT - BLOOD CULTURE IDENTIFICATION (BCID)  Susan [name] is an 73 y.o. female who presented to [facility] on 12/29/2023 with a chief complaint of shock  Assessment:  58 YOF who presented in septic shock with possible sources including chronic wounds, UTI, or HD cath. 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 Palliative has seen the patient and made the transition to full comfort.   Name of physician (or Provider) Contacted: CCM team + ID  Current antibiotics: None - comfort care  Changes to prescribed antibiotics recommended:  None - comfort care orders placed  Results for orders placed or performed during the hospital encounter of 12/29/23  Blood Culture ID Panel (Reflexed) (Collected: 12/29/2023 12:06 PM)  Result Value Ref Range   Enterococcus faecalis NOT DETECTED NOT DETECTED   Enterococcus Faecium DETECTED (A) NOT DETECTED   Listeria monocytogenes NOT DETECTED NOT DETECTED   Staphylococcus species NOT DETECTED NOT DETECTED   Staphylococcus aureus (BCID) NOT DETECTED NOT DETECTED   Staphylococcus epidermidis NOT DETECTED NOT DETECTED   Staphylococcus lugdunensis NOT DETECTED NOT DETECTED   Streptococcus species NOT DETECTED NOT DETECTED   Streptococcus agalactiae NOT DETECTED NOT DETECTED   Streptococcus pneumoniae NOT DETECTED NOT DETECTED   Streptococcus pyogenes NOT DETECTED NOT DETECTED   A.calcoaceticus-baumannii NOT DETECTED NOT DETECTED   Bacteroides fragilis NOT DETECTED NOT DETECTED   Enterobacterales NOT DETECTED NOT DETECTED   Enterobacter cloacae complex NOT DETECTED NOT DETECTED   Escherichia coli NOT DETECTED NOT DETECTED   Klebsiella aerogenes NOT DETECTED NOT DETECTED   Klebsiella oxytoca NOT DETECTED NOT DETECTED   Klebsiella pneumoniae NOT DETECTED NOT DETECTED   Proteus species NOT DETECTED NOT DETECTED   Salmonella species NOT DETECTED  NOT DETECTED   Serratia marcescens NOT DETECTED NOT DETECTED   Haemophilus influenzae NOT DETECTED NOT DETECTED   Neisseria meningitidis NOT DETECTED NOT DETECTED   Pseudomonas aeruginosa NOT DETECTED NOT DETECTED   Stenotrophomonas maltophilia NOT DETECTED NOT DETECTED   Candida albicans NOT DETECTED NOT DETECTED   Candida auris NOT DETECTED NOT DETECTED   Candida glabrata NOT DETECTED NOT DETECTED   Candida krusei NOT DETECTED NOT DETECTED   Candida parapsilosis NOT DETECTED NOT DETECTED   Candida tropicalis NOT DETECTED NOT DETECTED   Cryptococcus neoformans/gattii NOT DETECTED NOT DETECTED   Vancomycin  resistance DETECTED (A) NOT DETECTED    Thank you for allowing pharmacy to be a part of this patient's care.  Almarie Lunger, PharmD, BCPS, BCIDP Infectious Diseases Clinical Pharmacist 12/30/2023 11:14 AM   **Pharmacist phone directory can now be found on [email or web address] ([email or web address]).  Listed under [facility].

## 2023-12-30 NOTE — Progress Notes (Addendum)
 This chaplain responded to PMT NP-Michelle consult for EOL spiritual care. The Pt. daughter. daughter's S/O, and husband are at the bedside. The Pt. has transitioned to comfort care with the goal of the Pt. returning home.  The family accepted the chaplain's invitation for story telling. The chaplain understands the Pt. life is centered on God's love, sharing God's love, family, and her two dachshunds.  The Pt. and family accepted the chaplain's invitation for prayer and F/U spiritual care.  **1250 Music brought to the bedside per the Pt. request. RN updated.  **1645 Chaplain with Pt. and family during transition to home with hospice.  Chaplain Leeroy Hummer [phone]

## 2023-12-30 NOTE — TOC Transition Note (Signed)
 Transition of Care [facility]) - Discharge Note   Patient Details  Name: Susan [name] MRN: [id number] Date of Birth: 18-Nov-1950  Transition of Care [facility]) CM/SW Contact:  Tom-Johnson, Harvest Muskrat, RN Phone Number: 12/30/2023, 3:03 PM   Clinical Narrative:     Patient scheduled for discharge home today under the services of [facility]. [facility] scheduled to pickup patient at 4PM.   No further ICM needs noted.          Final next level of care: Home w Hospice Care Barriers to Discharge: Barriers Resolved   Patient Goals and CMS Choice Patient states their goals for this hospitalization and ongoing recovery are:: To return home CMS Medicare.gov Compare Post Acute Care list provided to:: Patient Choice offered to / list presented to : Patient, Spouse      Discharge Placement                Patient to be transferred to facility by: [facility] Name of family member notified: [facility] Additional resources added to the After Visit Summary for                  DME Arranged: N/A DME Agency: NA       HH Arranged: NA          Social Drivers of Health (SDOH) Interventions SDOH Screenings   Food Insecurity: Patient Unable To Answer (12/29/2023)  Housing: Patient Unable To Answer (12/29/2023)  Transportation Needs: Patient Unable To Answer (12/29/2023)  Utilities: Patient Unable To Answer (12/29/2023)  Alcohol  Screen: Low Risk  (08/16/2021)  Depression (PHQ2-9): High Risk (10/26/2023)  Financial Resource Strain: Low Risk  (08/16/2021)  Physical Activity: Inactive (08/16/2021)  Social Connections: Patient Unable To Answer (12/29/2023)  Recent Concern: Social Connections - Socially Isolated (12/17/2023)  Stress: No Stress Concern Present (08/16/2021)  Tobacco Use: Medium Risk (12/29/2023)     Readmission Risk Interventions    12/21/2023    4:04 PM 10/02/2023   12:43 PM 08/06/2023    4:00 PM  Readmission Risk Prevention Plan   Transportation Screening Complete Complete Complete  Medication Review Oceanographer) Complete  Referral to Pharmacy  PCP or Specialist appointment within 3-5 days of discharge Complete Complete Complete  HRI or Home Care Consult Complete Complete Complete  SW Recovery Care/Counseling Consult Complete Complete Complete  Palliative Care Screening Not Applicable Not Applicable Not Applicable  Skilled Nursing Facility Not Applicable Not Applicable Not Applicable

## 2023-12-30 NOTE — Progress Notes (Signed)
 Contacted [facility] to be advised of plans for pt to d/c to home today with hospice care.   Randine Mungo Dialysis Navigator [phone]

## 2023-12-30 NOTE — Consult Note (Signed)
 Palliative Medicine Inpatient Consult Note  Consulting Provider: Maude [name]  Reason for consult:   Palliative Care Consult Services Palliative Medicine Consult  Reason for Consult? goals of care   12/30/2023  HPI:  Per intake H&P --> Susan [name] is a 73 y.o. female with PMHx significant for T2DM, Stroke, HTN, ESRD on dialysis, CHF who presents to [facility]  from [place] with AMS and hypotension. Patient is being treated broadly for sepsis. Palliative care has been asked to support additional goals of care conversations.   Clinical Assessment/Goals of Care:  *Please note that this is a verbal dictation therefore any spelling or grammatical errors are due to the Dragon Medical One system interpretation.  I have reviewed medical records including EPIC notes, labs and imaging, received report from bedside RN, assessed the patient who is lying in bed disoriented.  She opens her eyes and is able to state her name though does not know where she is or why.    I met with patient's spouse, Susan [name] to further discuss diagnosis prognosis, GOC, EOL wishes, disposition and options.   I introduced Palliative Medicine as specialized medical care for people living with serious illness. It focuses on providing relief from the symptoms and stress of a serious illness. The goal is to improve quality of life for both the patient and the family.  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  Social History:  Susan [name] is from [place]  originally.  She and her husband live in [place] presently.  They have been married for over 26 years.  Susan [name] has 2 children from a previous marriage and her husband has 1 child from a previous marriage.  They jointly have 4 grandchildren and 2  great-grandchildren from their respective children.  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  She had to retire due to complications of her diabetes.  She has a woman of faith practicing within Christianity.  Functional and Nutritional State:  Preceding hospitalization Susan [name] had been declining for roughly the last 1 month.  Her husband notes the last 2 years have been difficult after amputation but over the last month her sacral wound has despite all measures continued to worsen.  She has been fully dependent for all basic activities of daily living.  Her nutritional intake has been variable to poor.  Advance Directives:  A detailed discussion was had today regarding advanced directives.  Patient's surrogate decision maker is her spouse Susan [name].  Code Status:  Concepts specific to code status, artifical feeding and hydration, continued IV antibiotics and rehospitalization was had.  The difference between a aggressive medical intervention path  and a palliative comfort care path for this patient at this time was had.   Encouraged patient/family to consider DNR/DNI status understanding evidenced based poor outcomes in similar hospitalized patient, as the cause of arrest is likely associated with advanced chronic/terminal illness rather than an easily reversible acute cardio-pulmonary event. I explained that DNR/DNI does not change the medical plan and it only comes into effect after a person has arrested (died).  It is a protective measure to keep us  from harming the patient in their last moments of life. Susan [name] was agreeable to DNR/DNI with understanding that patient would not receive CPR, defibrillation, ACLS medications, or intubation.   Discussion:  Discussions were held in the setting of Susan [name]'s acute  on chronic disease burden.  We reviewed her unfortunate and recurrent rehospitalization's in the setting of various infections, septic shock, and abdominal discomfort.  We discussed the  significance of her sacral wound in the greater scheme of her health and how this is likely an ongoing nidus of infectious source.  We reviewed dialysis and how she has become weaker making dialysis treatments more burdensome.  Patient's spouse shares that Susan [name] had made various comments about being ready for end-of-life and not wanting to continue in this way.  We discussed options from here one option being to continue the present course which would be overall more burdensome and possibly contribute to increase in suffering.  Alternatively we discussed the option of comfort care.   We talked about transition to comfort measures in house and what that would entail inclusive of medications to control pain, dyspnea, agitation, nausea, itching, and hiccups.  We discussed stopping all uneccessary measures such as cardiac monitoring, blood draws, needle sticks, and frequent vital signs.   Susan [name] feels the better option is to allow Susan [name] to be made comfortable and to pass in a dignified and peaceful manner.  He shares how difficult this decision is but notes in doing it he is honoring her wishes. Utilized reflective listening throughout our time together.   We reviewed that hemodialysis treatments would be stopped, pressor medications would be stopped, continuous vital signs would be stopped, all needlesticks will be stopped, and medications would be given to alleviate symptoms of distress.  I shared in Kainat's case given her underlying sacral wound and the likely discomfort associated with this we will place her on opioids around-the-clock.  I shared the advantage of this is management of discomfort the disadvantage is it will make her more sleepy.  Susan [name] vocalizes awareness and is in agreement with this modality of care.  I daily Susan [name] shares he would like to get his wife home though he does recognize she is on a lot of supportive interventions.  He agrees to allow a day or two in the hospital to see  how she does under comfort care and if she is stable to bring her home with hospice care.  Discussed the importance of continued conversation with family and their  medical providers regarding overall plan of care and treatment options, ensuring decisions are within the context of the patients values and GOCs.  Decision Maker: Smaldone,Susan [name] (Spouse): [phone] (Mobile)   SUMMARY OF RECOMMENDATIONS   DNAR/DNI  Comfort care  Initiate fentanyl  100 mcg Q4H ATC in addition to PRN  Additional comfort medications as needed  Unrestricted visitation  Allow 24-48hrs to evaluate symptom management - if stable can proceed with discussion of transition to home with hospice. If not stable is likely to be an in hospital death  Appreciate chaplain support at end of life  Ongoing Palliative support  Code Status/Advance Care Planning: DNAR/DNI  Palliative Prophylaxis:  Aspiration, Bowel Regimen, Delirium Protocol, Frequent Pain Assessment, Oral Care, Palliative Wound Care, and Turn Reposition  Additional Recommendations (Limitations, Scope, Preferences): Continue current care  Psycho-social/Spiritual:  Desire for further Chaplaincy support: Yes Additional Recommendations: Education on EOL   Prognosis: Limited overall - hours to days  Discharge Planning: To be determined based upon patients stability  Vitals:   12/30/23 0645 12/30/23 0700  BP: (!) 122/49 (!) 121/46  Pulse: 87 88  Resp: 17 16  Temp: (!) 100.9 F (38.3 C) (!) 100.9 F (38.3 C)  SpO2: 97% 99%    Intake/Output Summary (Last  24 hours) at 12/30/2023 0715 Last data filed at 12/30/2023 0700 Gross per 24 hour  Intake 1713.87 ml  Output 25 ml  Net 1688.87 ml   Last Weight  Most recent update: 12/30/2023  5:11 AM    Weight  84.5 kg (186 lb 4.6 oz)            Gen:  Frail elderly, chronically ill appearing caucasian F HEENT: moist mucous membranes CV: Regular rate and irregular rhythm  PULM:  On 2LPM [place], breathing  is not labored ABD: soft/nontender  EXT: R AKA, decreased LLE pedal pulses Neuro: Somnolent  PPS: 10%   This conversation/these recommendations were discussed with patient primary care team, Dr. Theodoro and Donnamarie Gore, APP  Total Time: 43 Billing based on MDM: High ______________________________________________________ Rosaline Becton [place] Palliative Medicine Team Team Cell Phone: [phone] Please utilize secure chat with additional questions, if there is no response within 30 minutes please call the above phone number  Palliative Medicine Team providers are available by phone from 7am to 7pm daily and can be reached through the team cell phone.  Should this patient require assistance outside of these hours, please call the patient's attending physician.

## 2023-12-30 NOTE — Progress Notes (Signed)
 PCCM Brief Note  PMT has met with pt's husband for goals of care discussions. Husband has elected to transition to full comfort measures.  Discussed with PMT NP who will place comfort orders. RN updated on plan of care.  Will follow course. Once pressors have been discontinued and if pt tolerates, will transfer out of ICU and will ask TRH to assume care at that point if so.   Sammi Gore, PA - [facility] [place] [facility] For pager details, please see AMION or use Epic chat  After 1900, please call ELINK for cross coverage needs 12/30/2023, 10:04 AM

## 2023-12-30 NOTE — Progress Notes (Signed)
 NAME:  Susan [name], MRN:  [id number], DOB:  September 16, 1950, LOS: 1 ADMISSION DATE:  12/29/2023, CONSULTATION DATE:  8/5 REFERRING MD:  Radsom PA-C, CHIEF COMPLAINT:  shock    History of Present Illness:  73 year old female still resides at home, cared for by family.  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   Was just discharged from hospital 8/1 after being admitted once again for sepsis and septic shock 2/2 proteus UTI and possible Left lower extremity chronic wound infection +/- infected sacral wound. Completed IV antibiotics home under the care of her husband.   Seen in ortho office 8/4 noted to have recurrent ulcer laterally. The ulcer measures 9 x 5 cm with no purulence no drainage the wound is dry with large eschar. She was awake and oriented w/ no increased efforts noted on this visit.   Presents to ER 8/5 w/ mental status change. Pt not able to get last iHD treatment on day of admit.  Initial BP 74/44 Initial eval: glucose 254,Na 132, BUN 61, cr 5.94, bicarb 22, K 3.8, lactate 2.2, wbc 23.8 (was 10.5 6 days prior),  Urine dirty appearing, INR 1.9.  PCXR CM low volume no obvious infiltrates  Received total of 2.5 liters crystalloid, cultures sent, abx started, remained hypotensive so started on Norepi gtt and transferred to [facility]   Pertinent  Medical History   ESRD TTS, CAD w/ prior MI, Carotid artery disease, HFrEF (EF 30-35%),  PAF, RBBB, chronic hypotension (on midodrine ), Clotting disorder, Anemia, anxiety, arthritis, dyslipidemia, chronic  exertional dyspnea, diabetes type II (poorly controlled)  & on insulin  pump at home, diabetic retinopathy, peripheral neuropathy, Obesity, GERD, osteoarthritis, prior stroke, anxiety and depression, anemia of chronic disease, hypothyroidism   Prior ESBL producing infection as well as prior enterococcus bacteremia, sacral wound (last CT December 17 2023, no obvious abscess (treating w/ chemical debridement via WON-> Santyl ) prior BKA right lower ext.  LLE care: chronic venous diease w/ LLE ulcer, Vashe dressing changes to the left leg daily plus Ace compressive wrap. Elevate is much as possible (last seen by Harden 8/5)  Significant Hospital Events: Including procedures, antibiotic start and stop dates in addition to other pertinent events   8/5 admitted to [facility] from transfer from [facility] w/ MS change, shock and new leukocytosis. Working dx septic shock. Potential sources: urinary tract, Left lower extremity wound or sacrum   LEvophed  started in ER at [facility] via Rt tunneled HD cath\ Arrived in PM to 2m13: responsive and protecting airway and answering some questions (name and location). Nursing very concerned about sacral decub and LLE ulcer - both necrotic  Interim History / Subjective:   7/24 admit to [place] for periph pressors Urine ->=100,000 COLONIES/mL PROTEUS MIRABILIS Abnormal   7/25 Various onculst CCS consult  : rec local wound care.   nephro consulted.  Pt changed code status to DNR  [facility] consult Dr Harden - Recurrent venous insufficiency ulcer left lower extremity  - needs seial compression rwaps 7/27 trying to wean pressors abx narrow to rocephi IPAL -  cntnue current care 01/05/24  DISCHARGED HOME xxxxxxxxxxxxxxx 8/5 admitted to [facility] from transfer from [facility] ER w/ MS change, shock and new leukocytosis. Working dx septic shock. Potential sources: urinary tract, Left lower extremity wound or sacrum   LEvophed  started in ER at [facility] via Rt tunneled HD cath\ Arrived in PM to 62m13:  responsive and protecting airway and answering some questions (name and location). Nursing very concerned about sacral decub and LLE ulcer - both necrotic  Subjective   Fever to 102.9 overnight, now 101.1 (bladder). On Vanc. Remains on NE at 12 with MAP 68. Receiving NE via HD cath port.  Objective    Blood pressure (!) 121/46, pulse 88, temperature (!) 100.9 F (38.3 C), resp. rate 16, height 5' 5 (1.651 m), weight 84.5 kg, SpO2 99%.        Intake/Output Summary (Last 24 hours) at 12/30/2023 0829 Last data filed at 12/30/2023 0700 Gross per 24 hour  Intake 1713.87 ml  Output 25 ml  Net 1688.87 ml   Filed Weights   12/29/23 1002 12/29/23 1520 12/30/23 0500  Weight: 88 kg 81.7 kg 84.5 kg    Examination: General: chronically ill appearing female, in NAD.  HENT: [place]/AT. MM dry. Lungs: Normal effort. CTAB. Cardiovascular: IRIR, no M/R/G. Abdomen: [place]. BS x 4, S/NT/ND. Extremities: Rt BKA. LLE skin flaky Neuro: Intermittently confused but once aroused is oriented to person and place. MAE's. GU: not eaxamined   Assessment and Plan   Recurrent septic shock. Source likely urinary tract but also possibly sacral decub ulcer (per surgery, has progressed since last admission). Also possibly LLE venous ulcer or HD cath site, but both doubtful. Of note, BCx's prelim growing GPC's. - Continue Vancomycin , Meropenem . - Follow cultures through completion. - Continue NE, goal MAP > 60. - Continue Midodrine . - 500cc LR bolus now as appears a bit dry on exam. - Stress dose steroids.  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 Last seen by ortho 8/4 - Wound care. - Surgery following, planning for OR vs bedside debridement today 8/6. - Continue Vancomycin , Meropenem .  Acute metabolic encephalopathy 2/2 sepsis. Hx anxiety and depression. - Continue supportive care as above. - Hold PTA Clonazepam , Percocet, Methocarbamol , Lyrica , Effexor .  H/o AF,  HFrEF  (30-35%), CAD, HLD and chronic hypotension (on midodrine ) - Heparin  gtt in lieu of PTA Apixaban . - Continue NE as above. - Stress steroids as above. - Repeat echo. - PICC pending. - Continue PTA Midodrine , Amiodarone , Atorvastatin .  ESRD TTS (Last iHD on 8/2). Hyponatremia. AGMA. - Nephrology to see today for further plans of HD vs CRRT. - Follow BMP.  Type II DM (poorly controlled) w/ hyperglycemia and both neuropathy and retinopathy. - SSI. Might need to escalate scale after starting stress dose steroids. - Hold PTA insulin  pump, lantus .  Hx hypothyroidism. - Continue PTA Synthroid . - Check TSH.  Anemia of chronic disease - Transfuse for Hgb < 7. - Follow CBC.  Hx GERD. - PPI.  Best Practice (right click and Reselect all SmartList Selections daily)   Diet/type: NPO w/ oral meds DVT prophylaxis systemic heparin  Pressure ulcer(s): present on admission  -stage 4 large in sacrum + LLE ulcer GI prophylaxis: PPI Lines: RT tunneled HD cath Foley:  N/A Code Status:  full code Last date of multidisciplinary goals of care discussion [no faimiuly at bedisde] Goals: Pall care consult called  CC time: 35 min.   Sammi Gore, PA - C [place] [facility] For pager details, please see AMION or use Epic chat  After 1900, please call ELINK for cross coverage needs 12/30/2023, 8:29 AM

## 2023-12-30 NOTE — Progress Notes (Signed)
 The patient is going comfort measures.  The patient's CODE STATUS was confirmed as DNR-comfort measures.  This was discussed with both the patient and her husband.

## 2023-12-30 NOTE — Plan of Care (Signed)
   Problem: Education: Goal: Knowledge of General Education information will improve Description: Including pain rating scale, medication(s)/side effects and non-pharmacologic comfort measures Outcome: Not Progressing   Problem: Health Behavior/Discharge Planning: Goal: Ability to manage health-related needs will improve Outcome: Not Progressing   Problem: Clinical Measurements: Goal: Ability to maintain clinical measurements within normal limits will improve Outcome: Not Progressing Goal: Will remain free from infection Outcome: Not Progressing Goal: Diagnostic test results will improve Outcome: Not Progressing Goal: Respiratory complications will improve Outcome: Not Progressing Goal: Cardiovascular complication will be avoided Outcome: Not Progressing

## 2023-12-30 NOTE — Progress Notes (Addendum)
   Palliative Medicine Inpatient Follow Up Note   Patients spouse, Susan [name] called me this afternoon and requested for patient to transition home with Hospice.  Conversations via secure chat held with Susan [name], Dr. Sammi, and [facility] liaison, Susan [name]. Susan [name] has obtained approval from her Hospice MD for Susan [name] to transition home on levophed  and for it to be weaned off there.   Patients family prefer an in home death for Susan [name].   Continue current care - will re-initiate levophed  in that hospital as an effort to get [name] home.  ____________________ Addendum:  Additional communication this afternoon with Susan [name] of [facility]. She shares the levophed  has been arranged though [facility] will need to take present pump and bag home with them to transition over to the one provided by [facility]. Thereafter, [facility] will bring the pump back to [name]. I was able to gain approval to pursue this by the assistant unit director, Susan [name].  Patients family and covering RN updated on the plan of care.  [facility] plan to pick the patient up at 4PM.  Add time: 30 _______________________ Addendum:  Hospice requested comfort meds to bedside. Coordination with Rahul, PA - CCM, Toribio, RN and [name] pharmacist [employer].   Paper scripts provided as imprivata is not working at this time.  Plan for morphine  and lorazepam  to be provided prior to discharge.  Add time: 70  ______________________________________________________________________________________ Rosaline Becton [place] Palliative Medicine Team Team Cell Phone: [phone] Please utilize secure chat with additional questions, if there is no response within 30 minutes please call the above phone number  Add Time: 50  Palliative Medicine Team providers are available by phone from 7am to 7pm daily and can be reached through the team cell phone.  Should this patient require assistance outside  of these hours, please call the patient's attending physician.

## 2023-12-30 NOTE — Progress Notes (Addendum)
 PHARMACY - ANTICOAGULATION CONSULT NOTE  Pharmacy Consult for Heparin  while Eliquis  on hold Indication: atrial fibrillation  Allergies  Allergen Reactions   Codeine Nausea And Vomiting   Keflex  [Cephalexin ] Diarrhea   Macrobid  [Nitrofurantoin ] Diarrhea and Nausea And Vomiting    Patient Measurements: Height: 5' 5 (165.1 cm) Weight: 84.5 kg (186 lb 4.6 oz) IBW/kg (Calculated) : 57 HEPARIN  DW (KG): 76.3  Vital Signs: Temp: 100.9 F (38.3 C) (08/06 0700) Temp Source: Bladder (08/06 0718) BP: 121/46 (08/06 0700) Pulse Rate: 88 (08/06 0700)  Labs: Recent Labs    12/29/23 1045 12/29/23 1206 12/29/23 1605 12/30/23 0101 12/30/23 0443 12/30/23 0856 12/30/23 0857  HGB 9.9*  --   --   --   --   --   --   HCT 32.9*  --   --   --   --   --   --   PLT 281  --   --   --   --   --   --   APTT  --   --   --   --   --   --  93*  LABPROT  --  22.8*  --   --   --   --   --   INR  --  1.9*  --   --   --   --   --   HEPARINUNFRC  --   --   --   --   --  >1.10*  --   CREATININE 5.94*  --    < > 6.12* 6.18*  --  6.41*   < > = values in this interval not displayed.    Estimated Creatinine Clearance: 8.3 mL/min (A) (by C-G formula based on SCr of 6.41 mg/dL (H)).   Medical History: Past Medical History:  Diagnosis Date   Allergy    Anemia    hx   Anxiety    Arthritis    generalized (03/15/2014)   CAD (coronary artery disease)    MI in 2000 - MI  2007 - treated bare metal stent (no nuclear since then as 9/11)   Carotid artery disease (HCC)    CHF (congestive heart failure) (HCC)    Chronic diastolic heart failure (HCC)    a) ECHO (08/2013) EF 55-60% and RV function nl b) RHC (08/2013) RA 4, RV 30/5/7, PA 25/10 (16), PCWP 7, Fick CO/CI 6.3/2.7, PVR 1.5 WU, PA 61 and 66%   Clotting disorder (HCC)    Daily headache    ~ every other day; since I fell in June (03/15/2014)   Depression    Diabetic retinopathy (HCC)    Dyslipidemia    ESRD (end stage renal disease) (HCC)     Dialysis on Tues Thurs Sat   Exertional shortness of breath    GERD (gastroesophageal reflux disease)    History of blood transfusion    History of kidney stones    HTN (hypertension)    Hypothyroidism    Myocardial infarction (HCC)    Obesity    Osteoarthritis    Oxygen  deficiency    PAF (paroxysmal atrial fibrillation) (HCC)    Peripheral neuropathy    bilateral feet/hands   PONV (postoperative nausea and vomiting)    RBBB (right bundle branch block)    Old   Stroke (HCC)    mini strokes   Type II diabetes mellitus (HCC)    Type II, [place] libre left upper arm. patient has omnipod insulin  pump with Novolin  R Insulin     Medications:  Scheduled:   amiodarone   200 mg Oral Daily   atorvastatin   80 mg Oral Daily   [START ON 01/09/2024] calcitRIOL   2.25 mcg Oral Q T,Th,Sa-HD   Chlorhexidine  Gluconate Cloth  6 each Topical Daily   Chlorhexidine  Gluconate Cloth  6 each Topical Q0600   [START ON 01/01/2024] collagenase    Topical Daily   folic acid   1 mg Oral Daily   hydrocortisone  sod succinate (SOLU-CORTEF ) inj  100 mg Intravenous Q8H   insulin  aspart  0-20 Units Subcutaneous Q4H   levothyroxine   50 mcg Oral QAC breakfast   liver oil-zinc  oxide   Topical BID   midodrine   10 mg Oral Q8H   pantoprazole   40 mg Oral Daily   polyethylene glycol  17 g Oral Daily   sevelamer  carbonate  1,600 mg Oral TID WC   sodium hypochlorite   Irrigation BID   Infusions:   sodium chloride      sodium chloride      heparin  1,700 Units/hr (12/30/23 0710)   lactated ringers  40 mL/hr at 12/30/23 0700   meropenem  (MERREM ) IV Stopped (12/29/23 1653)   norepinephrine  (LEVOPHED ) Adult infusion 12 mcg/min (12/30/23 0700)   [START ON 01/10/2024] vancomycin      PRN: acetaminophen , albuterol , docusate sodium , fentaNYL  (SUBLIMAZE ) injection, polyethylene glycol, sevelamer  carbonate  Assessment: 73 yo female on chronic Eliquis  for hx afib. Admitted now with recurrent septic shock. Pharmacy consulted to dose  IV heparin  while Eliquis  on hold.  Last dose of Eliquis  currently unknown, but no administered since admitted inpatient this AM ~9am  aPTT 93 is therapeutic on current dose of heparin . Some delay in labs from overnight. No other issues per RN.  Heparin  level elevated >1.1 due to interference from recent Eliquis  exposure   Goal of Therapy:  Heparin  level 0.3-0.7 units/ml aPTT 66-102 seconds Monitor platelets by anticoagulation protocol: Yes   Plan:  Continue IV heparin  at 1700 units/hr (no bolus) Check confirmatory aPTT in 8 hours and daily HL until correlate Daily CBC Monitor for signs/symptoms of bleeding  Rankin Sams, PharmD, BCPS, BCCCP Clinical Pharmacist

## 2023-12-30 NOTE — Progress Notes (Signed)
 PICC order received. Patient is ESRD with Right HD Catheter. Ok to place PICC  by Fritz Dover NP Nephrology per secure chat.

## 2024-01-01 LAB — CULTURE, BLOOD (ROUTINE X 2): Special Requests: ADEQUATE

## 2024-01-03 LAB — CULTURE, BLOOD (ROUTINE X 2): Culture: NO GROWTH

## 2024-01-04 LAB — CULTURE, BLOOD (ROUTINE X 2): Culture: NO GROWTH

## 2024-01-13 ENCOUNTER — Ambulatory Visit: Admitting: Physician Assistant

## 2024-01-19 ENCOUNTER — Ambulatory Visit

## 2024-01-22 ENCOUNTER — Ambulatory Visit: Admitting: Podiatry

## 2024-01-25 DEATH — deceased

## 2024-02-01 ENCOUNTER — Ambulatory Visit: Admitting: Orthopedic Surgery

## 2024-02-10 ENCOUNTER — Ambulatory Visit: Admitting: Cardiology

## 2024-03-10 ENCOUNTER — Other Ambulatory Visit (HOSPITAL_COMMUNITY): Payer: Self-pay
